# Patient Record
Sex: Female | Born: 1990 | Race: White | Hispanic: No | Marital: Single | Smoking: Never smoker
Health system: Southern US, Community
[De-identification: ages and names within clinical notes are randomized; demographics above are authoritative.]

## PROBLEM LIST (undated history)

## (undated) DIAGNOSIS — K3184 Gastroparesis: Secondary | ICD-10-CM

## (undated) DIAGNOSIS — N189 Chronic kidney disease, unspecified: Secondary | ICD-10-CM

## (undated) DIAGNOSIS — F419 Anxiety disorder, unspecified: Secondary | ICD-10-CM

## (undated) DIAGNOSIS — E079 Disorder of thyroid, unspecified: Secondary | ICD-10-CM

## (undated) DIAGNOSIS — E2740 Unspecified adrenocortical insufficiency: Secondary | ICD-10-CM

## (undated) DIAGNOSIS — E109 Type 1 diabetes mellitus without complications: Secondary | ICD-10-CM

## (undated) DIAGNOSIS — I509 Heart failure, unspecified: Secondary | ICD-10-CM

## (undated) DIAGNOSIS — G43909 Migraine, unspecified, not intractable, without status migrainosus: Secondary | ICD-10-CM

## (undated) DIAGNOSIS — E119 Type 2 diabetes mellitus without complications: Secondary | ICD-10-CM

## (undated) DIAGNOSIS — F32A Depression, unspecified: Secondary | ICD-10-CM

## (undated) DIAGNOSIS — D649 Anemia, unspecified: Secondary | ICD-10-CM

## (undated) DIAGNOSIS — I10 Essential (primary) hypertension: Secondary | ICD-10-CM

## (undated) DIAGNOSIS — I959 Hypotension, unspecified: Secondary | ICD-10-CM

## (undated) DIAGNOSIS — Z5189 Encounter for other specified aftercare: Secondary | ICD-10-CM

## (undated) HISTORY — DX: Chronic kidney disease, unspecified: N18.9

## (undated) HISTORY — DX: Anxiety disorder, unspecified: F41.9

## (undated) HISTORY — DX: Depression, unspecified: F32.A

## (undated) HISTORY — DX: Anemia, unspecified: D64.9

## (undated) HISTORY — DX: Disorder of thyroid, unspecified: E07.9

## (undated) HISTORY — PX: MOUTH SURGERY: SHX715

## (undated) HISTORY — DX: Gastroparesis: K31.84

## (undated) HISTORY — DX: Migraine, unspecified, not intractable, without status migrainosus: G43.909

## (undated) HISTORY — DX: Heart failure, unspecified: I50.9

## (undated) HISTORY — DX: Encounter for other specified aftercare: Z51.89

## (undated) HISTORY — DX: Hypotension, unspecified: I95.9

## (undated) SURGERY — ECHOCARDIOGRAM, TRANSESOPHAGEAL
Anesthesia: Moderate Sedation

---
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
## 2010-02-28 ENCOUNTER — Emergency Department: Payer: Self-pay | Admitting: Emergency Medicine

## 2010-06-01 DIAGNOSIS — J209 Acute bronchitis, unspecified: Secondary | ICD-10-CM | POA: Insufficient documentation

## 2010-07-21 ENCOUNTER — Emergency Department: Payer: Self-pay | Admitting: Emergency Medicine

## 2010-09-30 ENCOUNTER — Emergency Department: Payer: Self-pay | Admitting: Emergency Medicine

## 2011-11-22 ENCOUNTER — Emergency Department: Payer: Self-pay | Admitting: Emergency Medicine

## 2011-11-22 LAB — PREGNANCY, URINE: Pregnancy Test, Urine: NEGATIVE m[IU]/mL

## 2013-03-01 ENCOUNTER — Emergency Department: Payer: Self-pay | Admitting: Emergency Medicine

## 2013-03-01 LAB — CBC
HCT: 35.3 % (ref 35.0–47.0)
HGB: 12.2 g/dL (ref 12.0–16.0)
RDW: 13 % (ref 11.5–14.5)
WBC: 4.6 10*3/uL (ref 3.6–11.0)

## 2013-03-01 LAB — COMPREHENSIVE METABOLIC PANEL
Albumin: 3.9 g/dL (ref 3.4–5.0)
Anion Gap: 5 — ABNORMAL LOW (ref 7–16)
BUN: 13 mg/dL (ref 7–18)
Bilirubin,Total: 0.5 mg/dL (ref 0.2–1.0)
Co2: 30 mmol/L (ref 21–32)
Creatinine: 0.76 mg/dL (ref 0.60–1.30)
EGFR (African American): >60
EGFR (Non-African Amer.): >60
Glucose: 270 mg/dL — ABNORMAL HIGH (ref 65–99)
SGOT(AST): 32 U/L (ref 15–37)
Sodium: 133 mmol/L — ABNORMAL LOW (ref 136–145)
Total Protein: 7.5 g/dL (ref 6.4–8.2)

## 2013-03-01 LAB — URINALYSIS, COMPLETE
Bacteria: NONE SEEN
Bilirubin,UR: NEGATIVE
Blood: NEGATIVE
Protein: 100
Specific Gravity: 1.026 (ref 1.003–1.030)
Squamous Epithelial: NONE SEEN

## 2013-03-01 LAB — PREGNANCY, URINE: Pregnancy Test, Urine: NEGATIVE m[IU]/mL

## 2014-12-10 ENCOUNTER — Emergency Department
Admission: EM | Admit: 2014-12-10 | Discharge: 2014-12-10 | Disposition: A | Discharge status: Home / Self Care | Payer: Medicaid Other | Attending: Emergency Medicine | Admitting: Emergency Medicine

## 2014-12-10 ENCOUNTER — Emergency Department: Payer: Medicaid Other

## 2014-12-10 DIAGNOSIS — R102 Pelvic and perineal pain unspecified side: Secondary | ICD-10-CM

## 2014-12-10 DIAGNOSIS — Z3202 Encounter for pregnancy test, result negative: Secondary | ICD-10-CM | POA: Diagnosis not present

## 2014-12-10 LAB — URINALYSIS COMPLETE WITH MICROSCOPIC (ARMC ONLY)
Bacteria, UA: NONE SEEN
Bilirubin Urine: NEGATIVE
Glucose, UA: >500 mg/dL — AB
Hgb urine dipstick: NEGATIVE
Leukocytes, UA: NEGATIVE
Nitrite: NEGATIVE
Protein, ur: NEGATIVE mg/dL
Specific Gravity, Urine: 1.033 — ABNORMAL HIGH (ref 1.005–1.030)
WBC, UA: NONE SEEN WBC/hpf (ref 0–5)
pH: 5 (ref 5.0–8.0)

## 2014-12-10 LAB — POCT PREGNANCY, URINE: Preg Test, Ur: NEGATIVE

## 2014-12-10 LAB — WET PREP, GENITAL
Clue Cells Wet Prep HPF POC: NONE SEEN
Trich, Wet Prep: NONE SEEN
Yeast Wet Prep HPF POC: NONE SEEN

## 2014-12-10 LAB — CHLAMYDIA/NGC RT PCR (ARMC ONLY)
Chlamydia Tr: NOT DETECTED
N gonorrhoeae: NOT DETECTED

## 2014-12-10 MED ORDER — LIDOCAINE HCL (PF) 1 % IJ SOLN: 5.0000 mL | Freq: Once | INTRAMUSCULAR | Status: DC

## 2014-12-10 NOTE — ED Provider Notes (Signed)
CSN: [id number]     Arrival date & time 12/10/14  1230 History   First MD Initiated Contact with Patient 12/10/14 1258     Chief Complaint  Patient presents with  . Abdominal Pain     HPI Comments: 24 year old female presents today complaining of pelvic pain for the past 8 months. Pt reports she last had a period in Nov or Dec last year. She did have some spotting in February. Sexually active, has only had one partner in the past 6 months. No vaginal discharge or itching. No nausea or vomiting.    Patient is a 24 y.o. female presenting with female genitourinary complaint. The history is provided by the patient.  Female GU Problem This is a recurrent problem. Episode onset: over the past 6 months. The problem occurs constantly. The problem has been waxing and waning. Pertinent negatives include no abdominal pain, change in bowel habit, chills, fever, urinary symptoms or vomiting. Nothing aggravates the symptoms. She has tried NSAIDs for the symptoms. The treatment provided mild relief.    No past medical history on file. No past surgical history on file. No family history on file. History  Substance Use Topics  . Smoking status: Never Smoker   . Smokeless tobacco: Never Used  . Alcohol Use: No   OB History    No data available     Review of Systems  Constitutional: Negative for fever and chills.  Gastrointestinal: Negative for vomiting, abdominal pain and change in bowel habit.  Genitourinary: Positive for menstrual problem and pelvic pain. Negative for dysuria, hematuria, vaginal bleeding, vaginal discharge and vaginal pain.  All other systems reviewed and are negative.     Allergies  Review of patient's allergies indicates no known allergies.  Home Medications   Prior to Admission medications   Not on File   BP 110/78 mmHg  Pulse 97  Temp(Src) 98.8 F (37.1 C) (Oral)  Resp 18  Ht 5\' 1"  (1.549 m)  Wt 120 lb (54.432 kg)  BMI 22.69 kg/m2  SpO2 100%  LMP 05/10/2014  (Exact Date) Physical Exam  Constitutional: She is oriented to person, place, and time. Vital signs are normal. She appears well-developed and well-nourished.  Non-toxic appearance. She does not have a sickly appearance. She does not appear ill.  HENT:  Head: Normocephalic and atraumatic.  Cardiovascular: Normal rate, regular rhythm, normal heart sounds and intact distal pulses.  Exam reveals no gallop and no friction rub.   No murmur heard. Pulmonary/Chest: Effort normal and breath sounds normal. No respiratory distress. She has no wheezes. She has no rales.  Abdominal: Soft. Bowel sounds are normal. She exhibits no distension. There is no tenderness. There is no rebound and no guarding.  Genitourinary: Vagina normal and uterus normal. Pelvic exam was performed with patient supine. Cervix exhibits discharge. Right adnexum displays no mass, no tenderness and no fullness. Left adnexum displays no mass, no tenderness and no fullness.  Musculoskeletal: Normal range of motion.  Neurological: She is alert and oriented to person, place, and time.  Skin: Skin is warm and dry.  Psychiatric: She has a normal mood and affect. Her behavior is normal. Judgment and thought content normal.  Nursing note and vitals reviewed.   ED Course  Procedures (including critical care time) Labs Review Labs Reviewed  WET PREP, GENITAL - Abnormal; Notable for the following:    WBC, Wet Prep HPF POC MANY (*)    All other components within normal limits  URINALYSIS COMPLETEWITH  MICROSCOPIC ([facility] ONLY) - Abnormal; Notable for the following:    Color, Urine STRAW (*)    APPearance CLEAR (*)    Glucose, UA >500 (*)    Ketones, ur TRACE (*)    Specific Gravity, Urine 1.033 (*)    Squamous Epithelial / LPF 0-5 (*)    All other components within normal limits  CHLAMYDIA/NGC RT PCR ([facility] ONLY)  POC URINE PREG, ED  POCT PREGNANCY, URINE    Imaging Review US Transvaginal Non-ob  12/10/2014   CLINICAL  DATA:  Two-month history of pelvic pain  EXAM: TRANSABDOMINAL AND TRANSVAGINAL ULTRASOUND OF PELVIS  TECHNIQUE: Study was performed transabdominally to optimize pelvic field of view evaluation and transvaginally to optimize internal visceral architecture evaluation.  COMPARISON:  None  FINDINGS: Uterus  Measurements: 7.8 x 3.4 x 4.5 cm. No fibroids or other mass visualized. Uterus is anteverted.  Endometrium  Thickness: 5 mm.  No focal abnormality visualized.  Right ovary  Measurements: 4.2 x 2.4 x 2.2 cm. There is a dominant follicle on the right containing minimal septation measuring 2.0 x 1.6 x 1.7 cm  Left ovary  Measurements: 3.5 x 1.9 x 3.0 cm. Normal appearance/no adnexal mass.  Other findings  No free fluid.  IMPRESSION: Probable minimally hemorrhagic dominant follicle on the right. Study otherwise unremarkable.   Electronically Signed   By: Bretta Bang III M.D.   On: 12/10/2014 14:50   US Pelvis Complete  12/10/2014   CLINICAL DATA:  Two-month history of pelvic pain  EXAM: TRANSABDOMINAL AND TRANSVAGINAL ULTRASOUND OF PELVIS  TECHNIQUE: Study was performed transabdominally to optimize pelvic field of view evaluation and transvaginally to optimize internal visceral architecture evaluation.  COMPARISON:  None  FINDINGS: Uterus  Measurements: 7.8 x 3.4 x 4.5 cm. No fibroids or other mass visualized. Uterus is anteverted.  Endometrium  Thickness: 5 mm.  No focal abnormality visualized.  Right ovary  Measurements: 4.2 x 2.4 x 2.2 cm. There is a dominant follicle on the right containing minimal septation measuring 2.0 x 1.6 x 1.7 cm  Left ovary  Measurements: 3.5 x 1.9 x 3.0 cm. Normal appearance/no adnexal mass.  Other findings  No free fluid.  IMPRESSION: Probable minimally hemorrhagic dominant follicle on the right. Study otherwise unremarkable.   Electronically Signed   By: Bretta Bang III M.D.   On: 12/10/2014 14:50     EKG Interpretation None      MDM  I reviewed the labs and  ultrasound above. Discussed with patient that her findings are essential normal. Recommended follow up with OBGYN for hormone work up given amenorrhea. No indication for further workup in the ER.  Final diagnoses:  Pelvic pain in female        Mickeal Skinner 12/10/14 1526  Sharyn Creamer, MD 12/10/14 1537

## 2014-12-10 NOTE — ED Notes (Signed)
Per patient she has had irregular periods with cramping since November of last year. Patient reports that cramping has gotten worse over the past two weeks.  Patient also complains of intermittent constipation. Last normal BM 12-09-2014.

## 2016-02-16 DIAGNOSIS — E1140 Type 2 diabetes mellitus with diabetic neuropathy, unspecified: Secondary | ICD-10-CM | POA: Insufficient documentation

## 2016-03-20 LAB — HM HEPATITIS C SCREENING LAB: HM Hepatitis Screen: NEGATIVE

## 2016-04-02 DIAGNOSIS — Z8249 Family history of ischemic heart disease and other diseases of the circulatory system: Secondary | ICD-10-CM

## 2016-04-02 DIAGNOSIS — Z79899 Other long term (current) drug therapy: Secondary | ICD-10-CM

## 2016-04-02 DIAGNOSIS — E878 Other disorders of electrolyte and fluid balance, not elsewhere classified: Secondary | ICD-10-CM | POA: Diagnosis present

## 2016-04-02 DIAGNOSIS — Z833 Family history of diabetes mellitus: Secondary | ICD-10-CM

## 2016-04-02 DIAGNOSIS — Z794 Long term (current) use of insulin: Secondary | ICD-10-CM

## 2016-04-02 DIAGNOSIS — E860 Dehydration: Secondary | ICD-10-CM | POA: Diagnosis present

## 2016-04-02 DIAGNOSIS — N390 Urinary tract infection, site not specified: Secondary | ICD-10-CM | POA: Diagnosis present

## 2016-04-02 DIAGNOSIS — I10 Essential (primary) hypertension: Secondary | ICD-10-CM | POA: Diagnosis present

## 2016-04-02 DIAGNOSIS — E1010 Type 1 diabetes mellitus with ketoacidosis without coma: Principal | ICD-10-CM | POA: Diagnosis present

## 2016-04-02 DIAGNOSIS — G8918 Other acute postprocedural pain: Secondary | ICD-10-CM | POA: Diagnosis present

## 2016-04-02 DIAGNOSIS — R809 Proteinuria, unspecified: Secondary | ICD-10-CM | POA: Diagnosis present

## 2016-04-02 NOTE — ED Notes (Signed)
Pt s/p oral surgery (wisdom teeth removal) last Tuesday, c/o pain not getting better. Pt is diabetic, last CBG 287.

## 2016-04-03 ENCOUNTER — Encounter: Payer: Self-pay | Admitting: Emergency Medicine

## 2016-04-03 ENCOUNTER — Inpatient Hospital Stay
Admission: EM | Admit: 2016-04-03 | Discharge: 2016-04-04 | DRG: 638 | Disposition: A | Discharge status: Home / Self Care | Payer: Medicaid Other | Attending: Internal Medicine | Admitting: Internal Medicine

## 2016-04-03 DIAGNOSIS — E878 Other disorders of electrolyte and fluid balance, not elsewhere classified: Secondary | ICD-10-CM | POA: Diagnosis present

## 2016-04-03 DIAGNOSIS — N390 Urinary tract infection, site not specified: Secondary | ICD-10-CM

## 2016-04-03 DIAGNOSIS — E1110 Type 2 diabetes mellitus with ketoacidosis without coma: Secondary | ICD-10-CM | POA: Diagnosis present

## 2016-04-03 DIAGNOSIS — Z8249 Family history of ischemic heart disease and other diseases of the circulatory system: Secondary | ICD-10-CM | POA: Diagnosis not present

## 2016-04-03 DIAGNOSIS — Z833 Family history of diabetes mellitus: Secondary | ICD-10-CM | POA: Diagnosis not present

## 2016-04-03 DIAGNOSIS — I10 Essential (primary) hypertension: Secondary | ICD-10-CM | POA: Diagnosis present

## 2016-04-03 DIAGNOSIS — Z79899 Other long term (current) drug therapy: Secondary | ICD-10-CM | POA: Diagnosis not present

## 2016-04-03 DIAGNOSIS — G8918 Other acute postprocedural pain: Secondary | ICD-10-CM

## 2016-04-03 DIAGNOSIS — E860 Dehydration: Secondary | ICD-10-CM | POA: Diagnosis present

## 2016-04-03 DIAGNOSIS — E1010 Type 1 diabetes mellitus with ketoacidosis without coma: Secondary | ICD-10-CM

## 2016-04-03 DIAGNOSIS — R809 Proteinuria, unspecified: Secondary | ICD-10-CM | POA: Diagnosis present

## 2016-04-03 DIAGNOSIS — Z794 Long term (current) use of insulin: Secondary | ICD-10-CM | POA: Diagnosis not present

## 2016-04-03 DIAGNOSIS — E0810 Diabetes mellitus due to underlying condition with ketoacidosis without coma: Secondary | ICD-10-CM

## 2016-04-03 HISTORY — DX: Type 2 diabetes mellitus without complications: E11.9

## 2016-04-03 HISTORY — DX: Type 1 diabetes mellitus without complications: E10.9

## 2016-04-03 HISTORY — DX: Essential (primary) hypertension: I10

## 2016-04-03 LAB — BASIC METABOLIC PANEL
ANION GAP: 9 (ref 5–15)
Anion gap: 17 — ABNORMAL HIGH (ref 5–15)
Anion gap: 13 (ref 5–15)
Anion gap: 12 (ref 5–15)
Anion gap: 7 (ref 5–15)
BUN: 16 mg/dL (ref 6–20)
BUN: 15 mg/dL (ref 6–20)
BUN: 13 mg/dL (ref 6–20)
BUN: 10 mg/dL (ref 6–20)
BUN: 10 mg/dL (ref 6–20)
CHLORIDE: 113 mmol/L — AB (ref 101–111)
CHLORIDE: 107 mmol/L (ref 101–111)
CO2: 8 mmol/L — ABNORMAL LOW (ref 22–32)
CO2: 8 mmol/L — ABNORMAL LOW (ref 22–32)
CO2: 12 mmol/L — ABNORMAL LOW (ref 22–32)
CO2: 14 mmol/L — AB (ref 22–32)
CO2: 23 mmol/L (ref 22–32)
CREATININE: 0.56 mg/dL (ref 0.44–1.00)
Calcium: 8.7 mg/dL — ABNORMAL LOW (ref 8.9–10.3)
Calcium: 8.2 mg/dL — ABNORMAL LOW (ref 8.9–10.3)
Calcium: 8.3 mg/dL — ABNORMAL LOW (ref 8.9–10.3)
Calcium: 8.4 mg/dL — ABNORMAL LOW (ref 8.9–10.3)
Calcium: 8.3 mg/dL — ABNORMAL LOW (ref 8.9–10.3)
Chloride: 113 mmol/L — ABNORMAL HIGH (ref 101–111)
Chloride: 116 mmol/L — ABNORMAL HIGH (ref 101–111)
Chloride: 112 mmol/L — ABNORMAL HIGH (ref 101–111)
Creatinine, Ser: 1.01 mg/dL — ABNORMAL HIGH (ref 0.44–1.00)
Creatinine, Ser: 0.7 mg/dL (ref 0.44–1.00)
Creatinine, Ser: 0.76 mg/dL (ref 0.44–1.00)
Creatinine, Ser: 0.65 mg/dL (ref 0.44–1.00)
GFR calc Af Amer: >60 mL/min (ref >60)
GFR calc Af Amer: >60 mL/min (ref >60)
GFR calc Af Amer: >60 mL/min (ref >60)
GFR calc Af Amer: >60 mL/min (ref >60)
GFR calc Af Amer: >60 mL/min (ref >60)
GFR calc non Af Amer: >60 mL/min (ref >60)
GFR calc non Af Amer: >60 mL/min (ref >60)
GFR calc non Af Amer: >60 mL/min (ref >60)
GFR calc non Af Amer: >60 mL/min (ref >60)
GLUCOSE: 277 mg/dL — AB (ref 65–99)
Glucose, Bld: 246 mg/dL — ABNORMAL HIGH (ref 65–99)
Glucose, Bld: 124 mg/dL — ABNORMAL HIGH (ref 65–99)
Glucose, Bld: 251 mg/dL — ABNORMAL HIGH (ref 65–99)
Glucose, Bld: 178 mg/dL — ABNORMAL HIGH (ref 65–99)
POTASSIUM: 4.1 mmol/L (ref 3.5–5.1)
Potassium: 3.8 mmol/L (ref 3.5–5.1)
Potassium: 4.2 mmol/L (ref 3.5–5.1)
Potassium: 4.2 mmol/L (ref 3.5–5.1)
Potassium: 3.2 mmol/L — ABNORMAL LOW (ref 3.5–5.1)
Sodium: 138 mmol/L (ref 135–145)
Sodium: 137 mmol/L (ref 135–145)
Sodium: 136 mmol/L (ref 135–145)
Sodium: 136 mmol/L (ref 135–145)
Sodium: 137 mmol/L (ref 135–145)

## 2016-04-03 LAB — GLUCOSE, CAPILLARY
Glucose-Capillary: 101 mg/dL — ABNORMAL HIGH (ref 65–99)
Glucose-Capillary: 106 mg/dL — ABNORMAL HIGH (ref 65–99)
Glucose-Capillary: 108 mg/dL — ABNORMAL HIGH (ref 65–99)
Glucose-Capillary: 131 mg/dL — ABNORMAL HIGH (ref 65–99)
Glucose-Capillary: 137 mg/dL — ABNORMAL HIGH (ref 65–99)
Glucose-Capillary: 158 mg/dL — ABNORMAL HIGH (ref 65–99)
Glucose-Capillary: 174 mg/dL — ABNORMAL HIGH (ref 65–99)
Glucose-Capillary: 186 mg/dL — ABNORMAL HIGH (ref 65–99)
Glucose-Capillary: 231 mg/dL — ABNORMAL HIGH (ref 65–99)
Glucose-Capillary: 288 mg/dL — ABNORMAL HIGH (ref 65–99)
Glucose-Capillary: 305 mg/dL — ABNORMAL HIGH (ref 65–99)
Glucose-Capillary: 315 mg/dL — ABNORMAL HIGH (ref 65–99)
Glucose-Capillary: 324 mg/dL — ABNORMAL HIGH (ref 65–99)

## 2016-04-03 LAB — CBC WITH DIFFERENTIAL/PLATELET
Basophils Absolute: 0.1 10*3/uL (ref 0–0.1)
Basophils Relative: 1 %
Eosinophils Absolute: 0.1 10*3/uL (ref 0–0.7)
Eosinophils Relative: 1 %
HEMATOCRIT: 37.1 % (ref 35.0–47.0)
HEMOGLOBIN: 12.3 g/dL (ref 12.0–16.0)
Lymphocytes Relative: 15 %
Lymphs Abs: 2.1 10*3/uL (ref 1.0–3.6)
MCH: 29.9 pg (ref 26.0–34.0)
MCHC: 33.2 g/dL (ref 32.0–36.0)
MCV: 90.2 fL (ref 80.0–100.0)
MONOS PCT: 4 %
Monocytes Absolute: 0.5 10*3/uL (ref 0.2–0.9)
NEUTROS ABS: 10.9 10*3/uL — AB (ref 1.4–6.5)
NEUTROS PCT: 79 %
Platelets: 333 10*3/uL (ref 150–440)
RBC: 4.11 MIL/uL (ref 3.80–5.20)
RDW: 12.1 % (ref 11.5–14.5)
WBC: 13.7 10*3/uL — ABNORMAL HIGH (ref 3.6–11.0)

## 2016-04-03 LAB — COMPREHENSIVE METABOLIC PANEL
ALK PHOS: 68 U/L (ref 38–126)
ALT: 10 U/L — ABNORMAL LOW (ref 14–54)
ANION GAP: 22 — AB (ref 5–15)
AST: 11 U/L — ABNORMAL LOW (ref 15–41)
Albumin: 4.4 g/dL (ref 3.5–5.0)
BILIRUBIN TOTAL: 1.7 mg/dL — AB (ref 0.3–1.2)
BUN: 17 mg/dL (ref 6–20)
CO2: 8 mmol/L — ABNORMAL LOW (ref 22–32)
CREATININE: 0.88 mg/dL (ref 0.44–1.00)
Calcium: 9.1 mg/dL (ref 8.9–10.3)
Chloride: 106 mmol/L (ref 101–111)
Glucose, Bld: 349 mg/dL — ABNORMAL HIGH (ref 65–99)
Potassium: 4.8 mmol/L (ref 3.5–5.1)
Sodium: 136 mmol/L (ref 135–145)
Total Protein: 8.2 g/dL — ABNORMAL HIGH (ref 6.5–8.1)

## 2016-04-03 LAB — BETA-HYDROXYBUTYRIC ACID: Beta-Hydroxybutyric Acid: >8 mmol/L — ABNORMAL HIGH (ref 0.05–0.27)

## 2016-04-03 LAB — URINALYSIS COMPLETE WITH MICROSCOPIC (ARMC ONLY)
Bilirubin Urine: NEGATIVE
HGB URINE DIPSTICK: NEGATIVE
Nitrite: NEGATIVE
PH: 5 (ref 5.0–8.0)
Protein, ur: 100 mg/dL — AB
SPECIFIC GRAVITY, URINE: 1.021 (ref 1.005–1.030)

## 2016-04-03 LAB — MRSA PCR SCREENING: MRSA by PCR: NEGATIVE

## 2016-04-03 LAB — POCT PREGNANCY, URINE: Preg Test, Ur: NEGATIVE

## 2016-04-03 MED ORDER — ONDANSETRON HCL 4 MG/2ML IJ SOLN
4.0000 mg | INTRAMUSCULAR | Status: AC
  Administered 2016-04-03: 4 mg via INTRAVENOUS
  Filled 2016-04-03: qty 2

## 2016-04-03 MED ORDER — SODIUM CHLORIDE 0.9 % IV SOLN: INTRAVENOUS | Status: DC

## 2016-04-03 MED ORDER — DEXTROSE 5 % IV SOLN
INTRAVENOUS | Status: DC
  Administered 2016-04-03: 14:00:00 via INTRAVENOUS
  Filled 2016-04-03 (×2): qty 150

## 2016-04-03 MED ORDER — SODIUM CHLORIDE 0.9 % IV SOLN
INTRAVENOUS | Status: AC
  Administered 2016-04-03: 07:00:00 via INTRAVENOUS

## 2016-04-03 MED ORDER — POTASSIUM CHLORIDE CRYS ER 20 MEQ PO TBCR
40.0000 meq | EXTENDED_RELEASE_TABLET | Freq: Once | ORAL | Status: AC
  Administered 2016-04-03: 40 meq via ORAL
  Filled 2016-04-03: qty 2

## 2016-04-03 MED ORDER — BISACODYL 5 MG PO TBEC
5.0000 mg | DELAYED_RELEASE_TABLET | Freq: Every day | ORAL | Status: DC | PRN
  Administered 2016-04-03: 5 mg via ORAL
  Filled 2016-04-03: qty 1

## 2016-04-03 MED ORDER — CEFTRIAXONE SODIUM 1 G IJ SOLR
1.0000 g | Freq: Once | INTRAMUSCULAR | Status: AC
  Administered 2016-04-03: 1 g via INTRAMUSCULAR
  Filled 2016-04-03: qty 10

## 2016-04-03 MED ORDER — SODIUM CHLORIDE 0.9 % IV SOLN
30.0000 meq | Freq: Once | INTRAVENOUS | Status: AC
  Administered 2016-04-03: 30 meq via INTRAVENOUS
  Filled 2016-04-03 (×2): qty 15

## 2016-04-03 MED ORDER — MORPHINE SULFATE (PF) 4 MG/ML IV SOLN
4.0000 mg | Freq: Once | INTRAVENOUS | Status: AC
  Administered 2016-04-03: 4 mg via INTRAVENOUS
  Filled 2016-04-03: qty 1

## 2016-04-03 MED ORDER — INSULIN ASPART 100 UNIT/ML ~~LOC~~ SOLN
0.0000 [IU] | Freq: Three times a day (TID) | SUBCUTANEOUS | Status: DC
  Administered 2016-04-03: 5 [IU] via SUBCUTANEOUS
  Administered 2016-04-04: 2 [IU] via SUBCUTANEOUS
  Filled 2016-04-03: qty 2

## 2016-04-03 MED ORDER — INSULIN ASPART 100 UNIT/ML ~~LOC~~ SOLN
0.0000 [IU] | Freq: Every day | SUBCUTANEOUS | Status: DC
  Filled 2016-04-03: qty 5

## 2016-04-03 MED ORDER — DEXTROSE 5 % IV SOLN: 1.0000 g | INTRAVENOUS | Status: DC

## 2016-04-03 MED ORDER — DEXTROSE-NACL 5-0.45 % IV SOLN
INTRAVENOUS | Status: DC
  Administered 2016-04-03: 07:00:00 via INTRAVENOUS

## 2016-04-03 MED ORDER — POLYETHYLENE GLYCOL 3350 17 G PO PACK
17.0000 g | PACK | Freq: Every day | ORAL | Status: DC
  Administered 2016-04-03 – 2016-04-04 (×2): 17 g via ORAL
  Filled 2016-04-03 (×2): qty 1

## 2016-04-03 MED ORDER — CEPHALEXIN 500 MG PO CAPS
500.0000 mg | ORAL_CAPSULE | Freq: Two times a day (BID) | ORAL | Status: DC
  Administered 2016-04-03 – 2016-04-04 (×3): 500 mg via ORAL
  Filled 2016-04-03 (×3): qty 1

## 2016-04-03 MED ORDER — ENOXAPARIN SODIUM 40 MG/0.4ML ~~LOC~~ SOLN
40.0000 mg | SUBCUTANEOUS | Status: DC
  Administered 2016-04-03 – 2016-04-04 (×2): 40 mg via SUBCUTANEOUS
  Filled 2016-04-03 (×2): qty 0.4

## 2016-04-03 MED ORDER — SODIUM CHLORIDE 0.9 % IV BOLUS (SEPSIS)
1000.0000 mL | INTRAVENOUS | Status: AC
  Administered 2016-04-03: 1000 mL via INTRAVENOUS

## 2016-04-03 MED ORDER — SODIUM CHLORIDE 0.9 % IV SOLN
INTRAVENOUS | Status: DC
  Administered 2016-04-03: 3.4 [IU]/h via INTRAVENOUS

## 2016-04-03 MED ORDER — IBUPROFEN 400 MG PO TABS
400.0000 mg | ORAL_TABLET | Freq: Four times a day (QID) | ORAL | Status: DC | PRN
  Administered 2016-04-04 (×2): 400 mg via ORAL
  Filled 2016-04-03 (×4): qty 1

## 2016-04-03 MED ORDER — INSULIN GLARGINE 100 UNIT/ML ~~LOC~~ SOLN
11.0000 [IU] | Freq: Every day | SUBCUTANEOUS | Status: DC
  Administered 2016-04-03 – 2016-04-04 (×2): 11 [IU] via SUBCUTANEOUS
  Filled 2016-04-03 (×2): qty 0.11

## 2016-04-03 MED ORDER — KETOROLAC TROMETHAMINE 30 MG/ML IJ SOLN
15.0000 mg | Freq: Once | INTRAMUSCULAR | Status: AC
Start: 2016-04-03 — End: 2016-04-03
  Administered 2016-04-03: 15 mg via INTRAVENOUS
  Filled 2016-04-03: qty 1

## 2016-04-03 MED ORDER — SENNA 8.6 MG PO TABS: 1.0000 | ORAL_TABLET | Freq: Two times a day (BID) | ORAL | Status: DC | PRN

## 2016-04-03 MED ORDER — INSULIN REGULAR HUMAN 100 UNIT/ML IJ SOLN
INTRAMUSCULAR | Status: DC
  Administered 2016-04-03: 2.5 [IU]/h via INTRAVENOUS
  Filled 2016-04-03: qty 2.5

## 2016-04-03 MED ORDER — INSULIN ASPART 100 UNIT/ML ~~LOC~~ SOLN: 0.0000 [IU] | Freq: Three times a day (TID) | SUBCUTANEOUS | Status: DC

## 2016-04-03 NOTE — ED Triage Notes (Signed)
Pt s/p oral surgery (wisdom teeth and 2nd set molars removal) last Tuesday, c/o pain not getting better. Pt is diabetic, last CBG 287.   Pt also c/o dizziness and states pain all over, reports new bruising at right front jaw, obvious bilateral swelling to face, pt was prescribed narcotics post surg but was unable to fill it, taking tylenol and gabapentin 300mg  prescribed for diabetic foot pain.

## 2016-04-03 NOTE — ED Provider Notes (Addendum)
[facility] Emergency Department Provider Note  ____________________________________________   First MD Initiated Contact with Patient 04/03/16 [phone]     (approximate)  I have reviewed the triage vital signs and the nursing notes.   HISTORY  Chief Complaint Post-op Problem    HPI Ashley BlazerMirisha P Cleon Gustinshby is a 25 y.o. 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  She reports that she was not able to go to the pharmacy to have any pain medication or the oral rinse prescription filled.  She has been taking Tylenol but her pain has been persistent, moderate to severe, and keeping her from eating or drinking very much.  She has swelling on both sides of her face and reports that it hurts worse whenever she tries to eat or drink but she is not having any trouble swallowing her secretions, speaking, and no issues with her airway.  She reports that over the last couple of days her symptoms have gotten much worse and today she has felt her heart pounding and her lips getting dry.  She continues to take her insulin but she has not been able to eat or drink much.  She has had nausea and some mild abdominal cramping but no severe pain.  She is not having any difficulty breathing but she is breathing faster than usual.She denies fever/chills, chest pain, shortness of breath.  SHe reports that she has been in DKA "many times in the past".   Past Medical History:  Diagnosis Date  . Diabetes mellitus without complication (HCC)   . Hypertension   . Type 1 diabetes [facility](HCC)     Patient Active Problem List   Diagnosis Date Noted  . DKA (diabetic ketoacidoses) (HCC) 04/03/2016    Past Surgical History:  Procedure Laterality Date  . CESAREAN SECTION    . MOUTH SURGERY      Prior to Admission medications   Medication Sig Start Date End Date Taking?  Authorizing Provider  acetaminophen (TYLENOL) 500 MG tablet Take 500 mg by mouth every 4 (four) hours as needed.   Yes Historical Provider, MD  gabapentin (NEURONTIN) 300 MG capsule Take 300 mg by mouth 3 (three) times daily.   Yes Historical Provider, MD  insulin aspart (NOVOLOG) 100 UNIT/ML injection Inject 1-15 Units into the skin 3 (three) times daily before meals. Per sliding scale/ 2 units  Per 15 grams of carbs. Extra 1-15 units per glucose   Yes Historical Provider, MD  insulin glargine (LANTUS) 100 UNIT/ML injection Inject 11 Units into the skin daily.   Yes Historical Provider, MD  lisinopril (PRINIVIL,ZESTRIL) 5 MG tablet Take 5 mg by mouth daily.   Yes Historical Provider, MD  medroxyPROGESTERone (DEPO-PROVERA) 150 MG/ML injection Inject 150 mg into the muscle every 3 (three) months.   Yes Historical Provider, MD    Allergies Patient has no known allergies.  History reviewed. No pertinent family history.  Social History Social History  Substance Use Topics  . Smoking status: Never Smoker  . Smokeless tobacco: Never Used  . Alcohol use No    Review of Systems Constitutional: No fever/chills Eyes: No visual changes. ENT: No sore throat. Pain in both sides of the back of her mouth at the surgical sites. Cardiovascular: Denies chest pain. Respiratory: Denies shortness of breath. Gastrointestinal: Occasional abdominal cramping, nausea, no vomiting.  No diarrhea.  No constipation. Genitourinary: Negative  for dysuria. Musculoskeletal: Negative for back pain. Skin: Negative for rash. Neurological: Negative for headaches, focal weakness or numbness.  Lightheadedness and general malaise, generalized weakness  10-point ROS otherwise negative.  ____________________________________________   PHYSICAL EXAM:  VITAL SIGNS: ED Triage Vitals  Enc Vitals Group     BP 04/02/16 2355 101/72     Pulse Rate 04/02/16 2355 (!) 133     Resp 04/02/16 2355 18     Temp 04/02/16 2355 97.8  F (36.6 C)     Temp Source 04/02/16 2355 Oral     SpO2 04/02/16 2355 99 %     Weight 04/02/16 2357 108 lb (49 kg)     Height 04/02/16 2357 5\' 1"  (1.549 m)     Head Circumference --      Peak Flow --      Pain Score 04/03/16 0020 8     Pain Loc --      Pain Edu? --      Excl. in GC? --     Constitutional: Alert and oriented. Ill-appearing but non-toxic . HR 130s.  Appears quite dry. Eyes: Conjunctivae are normal. PERRL. EOMI. Head: Atraumatic. Nose: No congestion/rhinnorhea. Mouth/Throat: Mucous membranes are dry, lips are cracked.  Oropharynx non-erythematous.  No obvious dry socket or signs of acute infection at surgical sites.  Pain in jaw with trying to open wide, but not specifically trismus Neck: No stridor.  No meningeal signs.   Cardiovascular: Tachycardia, regular rhythm. Good peripheral circulation. Grossly normal heart sounds. Respiratory: Normal respiratory effort.  No retractions. Lungs CTAB. Gastrointestinal: Soft and nontender. No distention.  Musculoskeletal: No lower extremity tenderness nor edema. No gross deformities of extremities. Neurologic:  Normal speech and language. No gross focal neurologic deficits are appreciated.  Skin:  Skin is warm, dry and intact. No rash noted. Psychiatric: Mood and affect are normal. Speech and behavior are normal.  ____________________________________________   LABS (all labs ordered are listed, but only abnormal results are displayed)  Labs Reviewed  GLUCOSE, CAPILLARY - Abnormal; Notable for the following:       Result Value   Glucose-Capillary 315 (*)    All other components within normal limits  CBC WITH DIFFERENTIAL/PLATELET - Abnormal; Notable for the following:    WBC 13.7 (*)    Neutro Abs 10.9 (*)    All other components within normal limits  COMPREHENSIVE METABOLIC PANEL - Abnormal; Notable for the following:    CO2 8 (*)    Glucose, Bld 349 (*)    Total Protein 8.2 (*)    AST 11 (*)    ALT 10 (*)    Total  Bilirubin 1.7 (*)    Anion gap 22 (*)    All other components within normal limits  BETA-HYDROXYBUTYRIC ACID - Abnormal; Notable for the following:    Beta-Hydroxybutyric Acid >8.00 (*)    All other components within normal limits  URINALYSIS COMPLETEWITH MICROSCOPIC ([facility] ONLY) - Abnormal; Notable for the following:    Color, Urine YELLOW (*)    APPearance CLEAR (*)    Glucose, UA >500 (*)    Ketones, ur 2+ (*)    Protein, ur 100 (*)    Leukocytes, UA 1+ (*)    Bacteria, UA RARE (*)    Squamous Epithelial / LPF 0-5 (*)    All other components within normal limits  GLUCOSE, CAPILLARY - Abnormal; Notable for the following:    Glucose-Capillary 305 (*)    All other components within normal limits  URINE CULTURE  MRSA PCR SCREENING  POC URINE PREG, ED  POCT PREGNANCY, URINE   ____________________________________________  EKG  EKG not ordered by ED physician ____________________________________________  RADIOLOGY   No results found.  ____________________________________________   PROCEDURES  Procedure(s) performed:   .Critical Care Performed by: [name] Authorized by: [name]   Critical care provider statement:    Critical care time (minutes):  30   Critical care time was exclusive of:  Separately billable procedures and treating other patients   Critical care was necessary to treat or prevent imminent or life-threatening deterioration of the following conditions:  Metabolic crisis   Critical care was time spent personally by me on the following activities:  Development of treatment plan with patient or surrogate, discussions with consultants, evaluation of patient's response to treatment, examination of patient, obtaining history from patient or surrogate, ordering and performing treatments and interventions, ordering and review of laboratory studies, ordering and review of radiographic studies, pulse oximetry, re-evaluation of patient's condition and  review of old charts       Critical Care performed: No ____________________________________________   INITIAL IMPRESSION / ASSESSMENT AND PLAN / ED COURSE  Pertinent labs & imaging results that were available during my care of the patient were reviewed by me and considered in my medical decision making (see chart for details).  Ill-appearing, I suspect dehydration and possible DKA in the setting of the acute pain and decreased by mouth intake after oral surgery.  No signs of acute oral infection.  We are checking labs and providing a normal saline bolus while we are awaiting for the results.  I provided her morphine and Zofran and she states that her pain is better but she still feels ill all over.   Clinical Course as of Apr 03 458  Sat Apr 03, 2016  1610 Results consistent with DKA.  Will admit to hospitalist for further management.  [CF]  0429 +UTI, giving ceftriaxone 1 g IM (poor IV access).  Urine culture ordered.  [CF]    Clinical Course User Index [CF] [name], MD    ____________________________________________  FINAL CLINICAL IMPRESSION(S) / ED DIAGNOSES  Final diagnoses:  Diabetic ketoacidosis without coma associated with diabetes mellitus due to underlying condition (HCC)  Post-operative pain  Urinary tract infection without hematuria, site unspecified     MEDICATIONS GIVEN DURING THIS VISIT:  Medications  insulin regular (NOVOLIN R,HUMULIN R) 250 Units in sodium chloride 0.9 % 250 mL (1 Units/mL) infusion (2.5 Units/hr Intravenous New Bag/Given 04/03/16 0416)  cefTRIAXone (ROCEPHIN) injection 1 g (not administered)  sodium chloride 0.9 % bolus 1,000 mL (0 mLs Intravenous Stopped 04/03/16 0349)  morphine 4 MG/ML injection 4 mg (4 mg Intravenous Given 04/03/16 0143)  ondansetron (ZOFRAN) injection 4 mg (4 mg Intravenous Given 04/03/16 0143)  ketorolac (TORADOL) 30 MG/ML injection 15 mg (15 mg Intravenous Given 04/03/16 0143)     NEW OUTPATIENT  MEDICATIONS STARTED DURING THIS VISIT:  New Prescriptions   No medications on file    Modified Medications   No medications on file    Discontinued Medications   No medications on file     Note:  This document was prepared using Dragon voice recognition software and may include unintentional dictation errors.    [name], MD 04/03/16 9604    [name], MD 04/03/16 [phone]

## 2016-04-03 NOTE — ED Notes (Signed)
Report to rebecca, rn.  

## 2016-04-03 NOTE — H&P (Signed)
Ashley Camacho is an 25 y.o. female.   Chief Complaint: Nausea HPI: The patient with past medical history of type 1 diabetes presents emergency department complaining of nausea. The patient recently underwent oral surgery and has not been able to eat or drink since that time. She noticed she was feeling slightly nauseous and short of breath and came to the emergency department for evaluation. She was found to have elevated blood sugar as well as positive anion gap consistent with diabetic ketoacidosis. The patient's heart rate was also persistently elevated which prompted the emergency department staff to initiate an insulin drip to call the hospitalist service for admission.  Past Medical History:  Diagnosis Date  . Diabetes mellitus without complication ([place])   . Hypertension   . Type 1 diabetes [facility])     Past Surgical History:  Procedure Laterality Date  . CESAREAN SECTION    . MOUTH SURGERY      Family History  Problem Relation Age of Onset  . Diabetes type I Father   . CAD Father   . CAD Paternal Grandmother   . CAD Paternal Grandfather    Social History:  reports that she has never smoked. She has never used smokeless tobacco. She reports that she does not drink alcohol. Her drug history is not on file.  Allergies: No Known Allergies  Medications Prior to Admission  Medication Sig Dispense Refill  . acetaminophen (TYLENOL) 500 MG tablet Take 500 mg by mouth every 4 (four) hours as needed.    . gabapentin (NEURONTIN) 300 MG capsule Take 300 mg by mouth 3 (three) times daily.    . insulin aspart (NOVOLOG) 100 UNIT/ML injection Inject 1-15 Units into the skin 3 (three) times daily before meals. Per sliding scale/ 2 units  Per 15 grams of carbs. Extra 1-15 units per glucose    . insulin glargine (LANTUS) 100 UNIT/ML injection Inject 11 Units into the skin daily.    Marland Kitchen lisinopril (PRINIVIL,ZESTRIL) 5 MG tablet Take 5 mg by mouth daily.    . medroxyPROGESTERone (DEPO-PROVERA) 150  MG/ML injection Inject 150 mg into the muscle every 3 (three) months.      Results for orders placed or performed during the hospital encounter of 04/03/16 (from the past 48 hour(s))  Glucose, capillary     Status: Abnormal   Collection Time: 04/03/16 12:19 AM  Result Value Ref Range   Glucose-Capillary 315 (H) 65 - 99 mg/dL  CBC with Differential/Platelet     Status: Abnormal   Collection Time: 04/03/16  2:01 AM  Result Value Ref Range   WBC 13.7 (H) 3.6 - 11.0 K/uL   RBC 4.11 3.80 - 5.20 MIL/uL   Hemoglobin 12.3 12.0 - 16.0 g/dL   HCT 37.1 35.0 - 47.0 %   MCV 90.2 80.0 - 100.0 fL   MCH 29.9 26.0 - 34.0 pg   MCHC 33.2 32.0 - 36.0 g/dL   RDW 12.1 11.5 - 14.5 %   Platelets 333 150 - 440 K/uL   Neutrophils Relative % 79 %   Neutro Abs 10.9 (H) 1.4 - 6.5 K/uL   Lymphocytes Relative 15 %   Lymphs Abs 2.1 1.0 - 3.6 K/uL   Monocytes Relative 4 %   Monocytes Absolute 0.5 0.2 - 0.9 K/uL   Eosinophils Relative 1 %   Eosinophils Absolute 0.1 0 - 0.7 K/uL   Basophils Relative 1 %   Basophils Absolute 0.1 0 - 0.1 K/uL  Comprehensive metabolic panel  Status: Abnormal   Collection Time: 04/03/16  2:01 AM  Result Value Ref Range   Sodium 136 135 - 145 mmol/L    Comment: ELECTROLYTES REPEATED.PMH   Potassium 4.8 3.5 - 5.1 mmol/L   Chloride 106 101 - 111 mmol/L   CO2 8 (L) 22 - 32 mmol/L   Glucose, Bld 349 (H) 65 - 99 mg/dL   BUN 17 6 - 20 mg/dL   Creatinine, Ser 0.88 0.44 - 1.00 mg/dL   Calcium 9.1 8.9 - 10.3 mg/dL   Total Protein 8.2 (H) 6.5 - 8.1 g/dL   Albumin 4.4 3.5 - 5.0 g/dL   AST 11 (L) 15 - 41 U/L   ALT 10 (L) 14 - 54 U/L   Alkaline Phosphatase 68 38 - 126 U/L   Total Bilirubin 1.7 (H) 0.3 - 1.2 mg/dL   GFR calc non Af Amer >60 >60 mL/min   GFR calc Af Amer >60 >60 mL/min    Comment: (NOTE) The eGFR has been calculated using the CKD EPI equation. This calculation has not been validated in all clinical situations. eGFR's persistently <60 mL/min signify possible Chronic  Kidney Disease.    Anion gap 22 (H) 5 - 15  Beta-hydroxybutyric acid     Status: Abnormal   Collection Time: 04/03/16  2:01 AM  Result Value Ref Range   Beta-Hydroxybutyric Acid >8.00 (H) 0.05 - 0.27 mmol/L    Comment: RESULT CONFIRMED BY MANUAL DILUTION [facility]  Urinalysis complete, with microscopic ([facility] only)     Status: Abnormal   Collection Time: 04/03/16  3:48 AM  Result Value Ref Range   Color, Urine YELLOW (A) YELLOW   APPearance CLEAR (A) CLEAR   Glucose, UA >500 (A) NEGATIVE mg/dL   Bilirubin Urine NEGATIVE NEGATIVE   Ketones, ur 2+ (A) NEGATIVE mg/dL   Specific Gravity, Urine 1.021 1.005 - 1.030   Hgb urine dipstick NEGATIVE NEGATIVE   pH 5.0 5.0 - 8.0   Protein, ur 100 (A) NEGATIVE mg/dL   Nitrite NEGATIVE NEGATIVE   Leukocytes, UA 1+ (A) NEGATIVE   RBC / HPF 0-5 0 - 5 RBC/hpf   WBC, UA TOO NUMEROUS TO COUNT 0 - 5 WBC/hpf   Bacteria, UA RARE (A) NONE SEEN   Squamous Epithelial / LPF 0-5 (A) NONE SEEN   Mucous PRESENT    Hyaline Casts, UA PRESENT   Pregnancy, urine POC     Status: None   Collection Time: 04/03/16  3:54 AM  Result Value Ref Range   Preg Test, Ur NEGATIVE NEGATIVE    Comment:        THE SENSITIVITY OF THIS METHODOLOGY IS >24 mIU/mL   Glucose, capillary     Status: Abnormal   Collection Time: 04/03/16  4:01 AM  Result Value Ref Range   Glucose-Capillary 305 (H) 65 - 99 mg/dL   Comment 1 Notify RN   Glucose, capillary     Status: Abnormal   Collection Time: 04/03/16  5:25 AM  Result Value Ref Range   Glucose-Capillary 324 (H) 65 - 99 mg/dL  MRSA PCR Screening     Status: None   Collection Time: 04/03/16  5:54 AM  Result Value Ref Range   MRSA by PCR NEGATIVE NEGATIVE    Comment:        The GeneXpert MRSA Assay (FDA approved for NASAL specimens only), is one component of a comprehensive MRSA colonization surveillance program. It is not intended to diagnose MRSA infection nor to guide or monitor treatment  for MRSA infections.   Basic  metabolic panel     Status: Abnormal   Collection Time: 04/03/16  6:17 AM  Result Value Ref Range   Sodium 138 135 - 145 mmol/L   Potassium 3.8 3.5 - 5.1 mmol/L   Chloride 113 (H) 101 - 111 mmol/L   CO2 8 (L) 22 - 32 mmol/L   Glucose, Bld 246 (H) 65 - 99 mg/dL   BUN 16 6 - 20 mg/dL   Creatinine, Ser 1.01 (H) 0.44 - 1.00 mg/dL   Calcium 8.7 (L) 8.9 - 10.3 mg/dL   GFR calc non Af Amer >60 >60 mL/min   GFR calc Af Amer >60 >60 mL/min    Comment: (NOTE) The eGFR has been calculated using the CKD EPI equation. This calculation has not been validated in all clinical situations. eGFR's persistently <60 mL/min signify possible Chronic Kidney Disease.    Anion gap 17 (H) 5 - 15  Glucose, capillary     Status: Abnormal   Collection Time: 04/03/16  6:31 AM  Result Value Ref Range   Glucose-Capillary 231 (H) 65 - 99 mg/dL   Comment 1 Notify RN   Glucose, capillary     Status: Abnormal   Collection Time: 04/03/16  7:18 AM  Result Value Ref Range   Glucose-Capillary 174 (H) 65 - 99 mg/dL   No results found.  Review of Systems  Constitutional: Negative for chills and fever.  HENT: Negative for sore throat and tinnitus.   Eyes: Negative for blurred vision and redness.  Respiratory: Positive for shortness of breath. Negative for cough.   Cardiovascular: Negative for chest pain, palpitations, orthopnea and PND.  Gastrointestinal: Positive for nausea. Negative for abdominal pain, diarrhea and vomiting.  Genitourinary: Negative for dysuria, frequency and urgency.  Musculoskeletal: Negative for joint pain and myalgias.  Skin: Negative for rash.       No lesions  Neurological: Negative for speech change, focal weakness and weakness.  Endo/Heme/Allergies: Does not bruise/bleed easily.       No temperature intolerance  Psychiatric/Behavioral: Negative for depression and suicidal ideas.    Blood pressure 102/68, pulse (!) 131, temperature 98.5 F (36.9 C), temperature source Oral, resp. rate  (!) 21, height 5' 1"  (1.549 m), weight 49 kg (108 lb), SpO2 100 %. Physical Exam  Vitals reviewed. Constitutional: She is oriented to person, place, and time. She appears well-developed and well-nourished. No distress.  HENT:  Head: Normocephalic and atraumatic.  Mouth/Throat: Oropharynx is clear and moist.  Eyes: Conjunctivae and EOM are normal. Pupils are equal, round, and reactive to light. No scleral icterus.  Neck: Normal range of motion. Neck supple. No JVD present. No tracheal deviation present. No thyromegaly present.  Cardiovascular: Normal rate, regular rhythm and normal heart sounds.  Exam reveals no gallop and no friction rub.   No murmur heard. Respiratory: Effort normal and breath sounds normal.  GI: Soft. Bowel sounds are normal. She exhibits no distension. There is no tenderness.  Genitourinary:  Genitourinary Comments: Deferred  Musculoskeletal: Normal range of motion. She exhibits no edema.  Lymphadenopathy:    She has no cervical adenopathy.  Neurological: She is alert and oriented to person, place, and time. No cranial nerve deficit. She exhibits normal muscle tone.  Skin: Skin is warm and dry. No rash noted. No erythema.  Psychiatric: She has a normal mood and affect. Her behavior is normal. Judgment and thought content normal.     Assessment/Plan This is a 25 year old female admitted for  DKA. 1. DKA: Continue insulin drip until anion gap is closed. Hydrate with intravenous saline and D5 to maintain osmotic balance. When gap is closed initiate long-acting insulin while the patient to eat. Respiratory rate is normal oxygen saturations normal on room air. 2. Tachycardia: Secondary to dehydration as well as deconditioning. The patient states that she feels as if she has been progressively more short of breath over the past months to a year. She does not have orthopnea or lower extremity edema to indicate congestive heart failure. Continue to hydrate. 3. UTI: Continue  ceftriaxone 4. Hypertension: Controlled for now. Continue lisinopril. 5. DVT prolactins: Lovenox 6. GI prophylaxis: None Exline the patient is a full code. Time spent on admission orders and critical care approximately 45 minutes  Harrie Foreman, MD 04/03/2016, 7:31 AM

## 2016-04-03 NOTE — Progress Notes (Signed)
Patient is aox 4, on insulin drip, npo, vs wdl, st on the monitor. Started NS 1 L bolus.will continue to  Monitor.

## 2016-04-03 NOTE — Consult Note (Signed)
[facility] CONSULT NOTE    Date: 04/03/2016                  Patient Name:  Ashley Camacho  MRN: [id number]  DOB: 1991/02/22  Age / Sex: 25 y.o., female         PCP: No PCP Per Patient                 Service Requesting Consult: Dr. Enedina Finner                 Reason for Consult: acidosis            History of Present Illness: Patient is a 25 y.o. female with a PMHx of Diabetes mellitus type 1 and hypertension, who was admitted to [facility] on 04/03/2016 for evaluation of nausea at home status post wisdom tooth removal 4 as well as additional teeth extraction.  Upon presentation her blood glucose was 349. Serum bicarbonate was very low at 8. Despite insulin drip and IV fluid hydration her serum bicarbonate remains quite low at 8. The patient is currently on D5 half-normal saline. She likely has developed hyperchloremic metabolic acidosis. Serum chloride has gone from 106 up to 116. Patient reports at home that she was not able to eat or drink and also stopped taking her insulin. Thereafter she developed diabetic ketoacidosis. She follows with [facility] endocrinology regularly. Beta hydroxybutyrate level was quite high at greater than 8.   Medications: Outpatient medications: Prescriptions Prior to Admission  Medication Sig Dispense Refill Last Dose  . acetaminophen (TYLENOL) 500 MG tablet Take 500 mg by mouth every 4 (four) hours as needed.   04/03/2016 at Unknown time  . gabapentin (NEURONTIN) 300 MG capsule Take 300 mg by mouth 3 (three) times daily.   04/02/2016 at Unknown time  . insulin aspart (NOVOLOG) 100 UNIT/ML injection Inject 1-15 Units into the skin 3 (three) times daily before meals. Per sliding scale/ 2 units  Per 15 grams of carbs. Extra 1-15 units per glucose   04/02/2016 at Unknown time  . insulin glargine (LANTUS) 100 UNIT/ML injection Inject 11 Units into the skin daily.   04/02/2016 at Unknown time  . lisinopril (PRINIVIL,ZESTRIL) 5 MG  tablet Take 5 mg by mouth daily.   Past Month at Unknown time  . medroxyPROGESTERone (DEPO-PROVERA) 150 MG/ML injection Inject 150 mg into the muscle every 3 (three) months.   02/02/2016    Current medications: Current Facility-Administered Medications  Medication Dose Route Frequency Provider Last Rate Last Dose  . 0.9 %  sodium chloride infusion   Intravenous Continuous Arnaldo Natal, MD      . cephALEXin [facility]) capsule 500 mg  500 mg Oral Q12H Enedina Finner, MD   500 mg at 04/03/16 1257  . dextrose 5 %-0.45 % sodium chloride infusion   Intravenous Continuous Arnaldo Natal, MD 75 mL/hr at 04/03/16 [phone]    . enoxaparin (LOVENOX) injection 40 mg  40 mg Subcutaneous Q24H Arnaldo Natal, MD   40 mg at 04/03/16 0646  . ibuprofen (ADVIL,MOTRIN) tablet 400 mg  400 mg Oral Q6H PRN Enedina Finner, MD      . insulin glargine (LANTUS) injection 11 Units  11 Units Subcutaneous Daily Enedina Finner, MD      . polyethylene glycol (MIRALAX / GLYCOLAX) packet 17 g  17 g Oral Daily Arnaldo Natal, MD      . potassium chloride 30 mEq in sodium chloride 0.9 %  265 mL (KCL MULTIRUN) IVPB  30 mEq Intravenous Once Arnaldo Natal, MD   30 mEq at 04/03/16 1021      Allergies: No Known Allergies    Past Medical History: Past Medical History:  Diagnosis Date  . Diabetes mellitus without complication ([facility])   . Hypertension   . Type 1 diabetes ([facility])      Past Surgical History: Past Surgical History:  Procedure Laterality Date  . CESAREAN SECTION    . MOUTH SURGERY       Family History: Family History  Problem Relation Age of Onset  . Diabetes type I Father   . CAD Father   . CAD Paternal Grandmother   . CAD Paternal Grandfather      Social History: Social History   Social History  . Marital status: Single    Spouse name: N/A  . Number of children: N/A  . Years of education: N/A   Occupational History  . Not on file.   Social History Main Topics  . Smoking status: Never  Smoker  . Smokeless tobacco: Never Used  . Alcohol use No  . Drug use: Unknown  . Sexual activity: Not on file   Other Topics Concern  . Not on file   Social History Narrative  . No narrative on file     Review of Systems: Review of Systems  Constitutional: Negative for chills, fever and weight loss.  HENT: Negative for ear pain, hearing loss and tinnitus.   Eyes: Negative for blurred vision and double vision.  Respiratory: Negative for cough and hemoptysis.   Cardiovascular: Negative for chest pain and palpitations.  Gastrointestinal: Positive for nausea. Negative for abdominal pain and heartburn.  Genitourinary: Negative for dysuria, frequency and urgency.  Musculoskeletal: Negative for myalgias and neck pain.  Skin: Negative for itching and rash.  Neurological: Negative for dizziness and focal weakness.  Endo/Heme/Allergies: Negative for polydipsia. Does not bruise/bleed easily.  Psychiatric/Behavioral: Negative for depression. The patient is not nervous/anxious.      Vital Signs: Blood pressure (!) 98/59, pulse (!) 110, temperature 98.5 F (36.9 C), temperature source Oral, resp. rate 19, height 5\' 1"  (1.549 m), weight 49 kg (108 lb), SpO2 100 %.  Weight trends: Filed Weights   04/02/16 2357 04/03/16 0020  Weight: 49 kg (108 lb) 49 kg (108 lb)    Physical Exam: General: NAD,   Head: Normocephalic, atraumatic.  Eyes: Anicteric, EOMI  Nose: Mucous membranes moist, not inflammed, nonerythematous.  Throat: Oropharynx nonerythematous, no exudate appreciated.   Neck: Supple, trachea midline.  Lungs:  Normal respiratory effort. Clear to auscultation BL without crackles or wheezes.  Heart: RRR. S1 and S2 normal without gallop, murmur, or rubs.  Abdomen:  BS normoactive. Soft, Nondistended, non-tender.  No masses or organomegaly.  Extremities: No pretibial edema.  Neurologic: A&O X3, Motor strength is 5/5 in the all 4 extremities  Skin: No visible rashes, scars.     Lab results: Basic Metabolic Panel:  Recent Labs Lab 04/03/16 0201 04/03/16 0617 04/03/16 1043  NA 136 138 137  K 4.8 3.8 4.2  CL 106 113* 116*  CO2 8* 8* 8*  GLUCOSE 349* 246* 124*  BUN 17 16 15   CREATININE 0.88 1.01* 0.70  CALCIUM 9.1 8.7* 8.2*    Liver Function Tests:  Recent Labs Lab 04/03/16 0201  AST 11*  ALT 10*  ALKPHOS 68  BILITOT 1.7*  PROT 8.2*  ALBUMIN 4.4   No results for input(s): LIPASE, AMYLASE in the  last 168 hours. No results for input(s): AMMONIA in the last 168 hours.  CBC:  Recent Labs Lab 04/03/16 0201  WBC 13.7*  NEUTROABS 10.9*  HGB 12.3  HCT 37.1  MCV 90.2  PLT 333    Cardiac Enzymes: No results for input(s): CKTOTAL, CKMB, CKMBINDEX, TROPONINI in the last 168 hours.  BNP: Invalid input(s): POCBNP  CBG:  Recent Labs Lab 04/03/16 0814 04/03/16 0912 04/03/16 1015 04/03/16 1116 04/03/16 1217  GLUCAP 106* 108* 101* 131* 137*    Microbiology: Results for orders placed or performed during the hospital encounter of 04/03/16  MRSA PCR Screening     Status: None   Collection Time: 04/03/16  5:54 AM  Result Value Ref Range Status   MRSA by PCR NEGATIVE NEGATIVE Final    Comment:        The GeneXpert MRSA Assay (FDA approved for NASAL specimens only), is one component of a comprehensive MRSA colonization surveillance program. It is not intended to diagnose MRSA infection nor to guide or monitor treatment for MRSA infections.     Coagulation Studies: No results for input(s): LABPROT, INR in the last 72 hours.  Urinalysis:  Recent Labs  04/03/16 0348  COLORURINE YELLOW*  LABSPEC 1.021  PHURINE 5.0  GLUCOSEU >500*  HGBUR NEGATIVE  BILIRUBINUR NEGATIVE  KETONESUR 2+*  PROTEINUR 100*  NITRITE NEGATIVE  LEUKOCYTESUR 1+*      Imaging:  No results found.   Assessment & Plan: Pt is a 25 y.o. female with a PMHx of Diabetes mellitus type 1 and hypertension, who was [facility]2016 for  evaluation of nausea at home status post wisdom tooth removal 4 as well as additional teeth extraction.   1. Diabetic ketoacidosis without coma with concomitant hyperchloremic metabolic acidosis. 2. Proteinuria.  Plan: We were asked to see the patient for underlying acidosis. Serum bicarbonate remains low at 8. However hyperchloremia has also developed. She likely has developed hyperchloremic metabolic acidosis as a result of saline administration. Therefore we will discontinue saline for now in favor of sodium bicarbonate drip.  Continue to monitor blood glucose closely. In addition she was found have proteinuria which likely should be further evaluated and treated as an outpatient as she is at risk for development of chronic kidney disease given her underlying diabetes mellitus type 1.

## 2016-04-03 NOTE — ED Notes (Signed)
Lab called for venipuncture assistance. Danelle Earthly, rn notified of need for iv ultrasound. The 24g cath in right hand is infusing ns, very slowly, will take approx 4-6 hours at rate, pump will not infuse fluid without ringing "occulsion" due to size of catheter. No s/s of infiltration noted at iv site. Pt denies pain to hand.

## 2016-04-03 NOTE — ED Notes (Signed)
Verified insulin with Caleen Jobs, RN.

## 2016-04-03 NOTE — Progress Notes (Signed)
[facility] [place] at [facility]   PATIENT NAME: Ashley Camacho    MR#:  [id number]  DATE OF BIRTH:  07-Jun-1990  SUBJECTIVE:  Came with nausea and found tohave DKA Had recent wisdom teeth pulled.   REVIEW OF SYSTEMS:   Review of Systems  Constitutional: Negative for chills, fever and weight loss.  HENT: Negative for ear discharge, ear pain and nosebleeds.   Eyes: Negative for blurred vision, pain and discharge.  Respiratory: Negative for sputum production, shortness of breath, wheezing and stridor.   Cardiovascular: Negative for chest pain, palpitations, orthopnea and PND.  Gastrointestinal: Negative for abdominal pain, diarrhea, nausea and vomiting.  Genitourinary: Negative for flank pain, frequency and urgency.  Musculoskeletal: Negative for back pain and joint pain.  Neurological: Positive for weakness. Negative for sensory change, speech change and focal weakness.  Psychiatric/Behavioral: Negative for depression and hallucinations. The patient is not nervous/anxious.    Tolerating Diet:FLD Tolerating PT: ambulatory  DRUG ALLERGIES:  No Known Allergies  VITALS:  Blood pressure (!) 98/59, pulse (!) 110, temperature 98.5 F (36.9 C), temperature source Oral, resp. rate 19, height 5\' 1"  (1.549 m), weight 49 kg (108 lb), SpO2 100 %.  PHYSICAL EXAMINATION:   Physical Exam  GENERAL:  25 y.o.-year-old patient lying in the bed with no acute distress.  EYES: Pupils equal, round, reactive to light and accommodation. No scleral icterus. Extraocular muscles intact.  HEENT: Head atraumatic, normocephalic. Oropharynx and nasopharynx clear.  NECK:  Supple, no jugular venous distention. No thyroid enlargement, no tenderness.  LUNGS: Normal breath sounds bilaterally, no wheezing, rales, rhonchi. No use of accessory muscles of respiration.  CARDIOVASCULAR: S1, S2 normal. No murmurs, rubs, or gallops.  ABDOMEN: Soft, nontender, nondistended. Bowel sounds  present. No organomegaly or mass.  EXTREMITIES: No cyanosis, clubbing or edema b/l.    NEUROLOGIC: Cranial nerves II through XII are intact. No focal Motor or sensory deficits b/l.   PSYCHIATRIC:  patient is alert and oriented x 3.  SKIN: No obvious rash, lesion, or ulcer.   LABORATORY PANEL:  CBC  Recent Labs Lab 04/03/16 0201  WBC 13.7*  HGB 12.3  HCT 37.1  PLT 333    Chemistries   Recent Labs Lab 04/03/16 0201  04/03/16 1043  NA 136  < > 137  K 4.8  < > 4.2  CL 106  < > 116*  CO2 8*  < > 8*  GLUCOSE 349*  < > 124*  BUN 17  < > 15  CREATININE 0.88  < > 0.70  CALCIUM 9.1  < > 8.2*  AST 11*  --   --   ALT 10*  --   --   ALKPHOS 68  --   --   BILITOT 1.7*  --   --   < > = values in this interval not displayed. Cardiac Enzymes No results for input(s): TROPONINI in the last 168 hours. RADIOLOGY:  No results found. ASSESSMENT AND PLAN:  25 year old female admitted for DKA. 1. DKA:  -was on IV insulin drip until anion gap is closed.  -AG closed bt remains acidotic -spoke with Dr Cherylann Ratel who recommends bicarb gtt. Nephro consult placed  2. Tachycardia: Secondary to dehydration as well as deconditioning. T  3. UTI: Continue ceftriaxone---change to po keflex  4. Hypertension: Controlled for now. Continue lisinopril.  5. DVT prolactins: Lovenox   transfer to regular floor  Case discussed with Care Management/Social Worker. Management plans discussed with the patient, family and  they are in agreement.  CODE STATUS: full  DVT Prophylaxis: ambulatory  TOTAL TIME TAKING CARE OF THIS PATIENT: 30 minutes.  >50% time spent on counselling and coordination of care  POSSIBLE D/C IN 1-2DAYS, DEPENDING ON CLINICAL CONDITION.  Note: This dictation was prepared with Dragon dictation along with smaller phrase technology. Any transcriptional errors that result from this process are unintentional.  Yanis Larin M.D on 04/03/2016 at 12:31 PM  Between 7am to 6pm - Pager -  [phone]  After 6pm go to [email or web address] - password [email or web address]  [facility]  Office  [phone]  CC: Primary care physician; No PCP Per Patient

## 2016-04-04 LAB — BASIC METABOLIC PANEL
ANION GAP: 9 (ref 5–15)
ANION GAP: 7 (ref 5–15)
BUN: 7 mg/dL (ref 6–20)
BUN: 7 mg/dL (ref 6–20)
CALCIUM: 8.5 mg/dL — AB (ref 8.9–10.3)
CALCIUM: 8.4 mg/dL — AB (ref 8.9–10.3)
CO2: 23 mmol/L (ref 22–32)
CO2: 22 mmol/L (ref 22–32)
CREATININE: 0.51 mg/dL (ref 0.44–1.00)
CREATININE: 0.48 mg/dL (ref 0.44–1.00)
Chloride: 107 mmol/L (ref 101–111)
Chloride: 108 mmol/L (ref 101–111)
GFR calc Af Amer: >60 mL/min (ref >60)
GFR calc Af Amer: >60 mL/min (ref >60)
GLUCOSE: 179 mg/dL — AB (ref 65–99)
GLUCOSE: 155 mg/dL — AB (ref 65–99)
Potassium: 3.3 mmol/L — ABNORMAL LOW (ref 3.5–5.1)
Potassium: 3.7 mmol/L (ref 3.5–5.1)
Sodium: 139 mmol/L (ref 135–145)
Sodium: 137 mmol/L (ref 135–145)

## 2016-04-04 LAB — URINE CULTURE
CULTURE: NO GROWTH
SPECIAL REQUESTS: NORMAL

## 2016-04-04 LAB — GLUCOSE, CAPILLARY: Glucose-Capillary: 142 mg/dL — ABNORMAL HIGH (ref 65–99)

## 2016-04-04 MED ORDER — ONDANSETRON HCL 4 MG/2ML IJ SOLN
4.0000 mg | Freq: Four times a day (QID) | INTRAMUSCULAR | Status: DC | PRN
  Administered 2016-04-04: 4 mg via INTRAVENOUS
  Filled 2016-04-04: qty 2

## 2016-04-04 MED ORDER — CEPHALEXIN 500 MG PO CAPS: 500.0000 mg | ORAL_CAPSULE | Freq: Two times a day (BID) | ORAL | 0 refills | Status: DC

## 2016-04-04 MED ORDER — IBUPROFEN 400 MG PO TABS: 400.0000 mg | ORAL_TABLET | Freq: Three times a day (TID) | ORAL | 0 refills | Status: DC | PRN

## 2016-04-04 MED ORDER — MORPHINE SULFATE (PF) 2 MG/ML IV SOLN
1.0000 mg | INTRAVENOUS | Status: DC | PRN
  Administered 2016-04-04: 1 mg via INTRAVENOUS
  Filled 2016-04-04: qty 1

## 2016-04-04 NOTE — Discharge Summary (Signed)
[facility] - [place] at [facility]   PATIENT NAME: Ashley Camacho    MR#:  [id number]  DATE OF BIRTH:  07-13-1990  DATE OF ADMISSION:  04/03/2016 ADMITTING PHYSICIAN: Arnaldo Natal, MD  DATE OF DISCHARGE: 04/04/16  PRIMARY CARE PHYSICIAN: No PCP Per Patient    ADMISSION DIAGNOSIS:  Post-operative pain [G89.18] Urinary tract infection without hematuria, site unspecified [N39.0] Diabetic ketoacidosis without coma associated with diabetes mellitus due to underlying condition (HCC) [E08.10]  DISCHARGE DIAGNOSIS:  DKA-resolved Hyperchloremic metabolic acidosis-resolved UTI Recent wisdom teeth removal  SECONDARY DIAGNOSIS:   Past Medical History:  Diagnosis Date  . Diabetes mellitus without complication (HCC)   . Hypertension   . Type 1 diabetes [facility])     HOSPITAL COURSE:   25 year old female admitted for DKA.  1. DKA and hyperchloremic metabolic acidosis -was on IV insulin drip until anion gap is closed.  -AG closed bt remains acidotic -spoke with Dr Cherylann Ratel who recommends bicarb gtt. Nephro consult appreciated Acidosis corrected  2. Tachycardia: resolved  3. UTI: Continue ceftriaxone---change to po keflex  4. Hypertension: Controlled for now. Continue lisinopril.  5. DVT prolactins: Lovenox  Overall stable D/c home CONSULTS OBTAINED:  Treatment Team:  [place] Cherylann Ratel, MD  DRUG ALLERGIES:  No Known Allergies  DISCHARGE MEDICATIONS:   Current Discharge Medication List    START taking these medications   Details  cephALEXin (KEFLEX) 500 MG capsule Take 1 capsule (500 mg total) by mouth every 12 (twelve) hours. Qty: 6 capsule, Refills: 0    ibuprofen (ADVIL,MOTRIN) 400 MG tablet Take 1 tablet (400 mg total) by mouth every 8 (eight) hours as needed for headache. Qty: 5 tablet, Refills: 0      CONTINUE these medications which have NOT CHANGED   Details  acetaminophen (TYLENOL) 500 MG tablet Take 500 mg by mouth every 4  (four) hours as needed.    gabapentin (NEURONTIN) 300 MG capsule Take 300 mg by mouth 3 (three) times daily.    insulin aspart (NOVOLOG) 100 UNIT/ML injection Inject 1-15 Units into the skin 3 (three) times daily before meals. Per sliding scale/ 2 units  Per 15 grams of carbs. Extra 1-15 units per glucose    insulin glargine (LANTUS) 100 UNIT/ML injection Inject 11 Units into the skin daily.    lisinopril (PRINIVIL,ZESTRIL) 5 MG tablet Take 5 mg by mouth daily.    medroxyPROGESTERone (DEPO-PROVERA) 150 MG/ML injection Inject 150 mg into the muscle every 3 (three) months.        If you experience worsening of your admission symptoms, develop shortness of breath, life threatening emergency, suicidal or homicidal thoughts you must seek medical attention immediately by calling 911 or calling your MD immediately  if symptoms less severe.  You Must read complete instructions/literature along with all the possible adverse reactions/side effects for all the Medicines you take and that have been prescribed to you. Take any new Medicines after you have completely understood and accept all the possible adverse reactions/side effects.   Please note  You were cared for by a hospitalist during your hospital stay. If you have any questions about your discharge medications or the care you received while you were in the hospital after you are discharged, you can call the unit and asked to speak with the hospitalist on call if the hospitalist that took care of you is not available. Once you are discharged, your primary care physician will handle any further medical issues. Please note that NO REFILLS for  any discharge medications will be authorized once you are discharged, as it is imperative that you return to your primary care physician (or establish a relationship with a primary care physician if you do not have one) for your aftercare needs so that they can reassess your need for medications and monitor your  lab values. Today   SUBJECTIVE   Doing ok Tooth pain  VITAL SIGNS:  Blood pressure 111/67, pulse 89, temperature 98.2 F (36.8 C), temperature source Oral, resp. rate 18, height 5\' 1"  (1.549 m), weight 49 kg (108 lb), SpO2 99 %.  I/O:   Intake/Output Summary (Last 24 hours) at 04/04/16 0745 Last data filed at 04/04/16 0500  Gross per 24 hour  Intake           906.53 ml  Output              675 ml  Net           231.53 ml    PHYSICAL EXAMINATION:  GENERAL:  25 y.o.-year-old patient lying in the bed with no acute distress.  EYES: Pupils equal, round, reactive to light and accommodation. No scleral icterus. Extraocular muscles intact.  HEENT: Head atraumatic, normocephalic. Oropharynx and nasopharynx clear.  NECK:  Supple, no jugular venous distention. No thyroid enlargement, no tenderness.  LUNGS: Normal breath sounds bilaterally, no wheezing, rales,rhonchi or crepitation. No use of accessory muscles of respiration.  CARDIOVASCULAR: S1, S2 normal. No murmurs, rubs, or gallops.  ABDOMEN: Soft, non-tender, non-distended. Bowel sounds present. No organomegaly or mass.  EXTREMITIES: No pedal edema, cyanosis, or clubbing.  NEUROLOGIC: Cranial nerves II through XII are intact. Muscle strength 5/5 in all extremities. Sensation intact. Gait not checked.  PSYCHIATRIC: The patient is alert and oriented x 3.  SKIN: No obvious rash, lesion, or ulcer.   DATA REVIEW:   CBC   Recent Labs Lab 04/03/16 0201  WBC 13.7*  HGB 12.3  HCT 37.1  PLT 333    Chemistries   Recent Labs Lab 04/03/16 0201  04/04/16 0627  NA 136  < > 137  K 4.8  < > 3.7  CL 106  < > 108  CO2 8*  < > 22  GLUCOSE 349*  < > 155*  BUN 17  < > 7  CREATININE 0.88  < > 0.48  CALCIUM 9.1  < > 8.4*  AST 11*  --   --   ALT 10*  --   --   ALKPHOS 68  --   --   BILITOT 1.7*  --   --   < > = values in this interval not displayed.  Microbiology Results   Recent Results (from the past 240 hour(s))  MRSA PCR  Screening     Status: None   Collection Time: 04/03/16  5:54 AM  Result Value Ref Range Status   MRSA by PCR NEGATIVE NEGATIVE Final    Comment:        The GeneXpert MRSA Assay (FDA approved for NASAL specimens only), is one component of a comprehensive MRSA colonization surveillance program. It is not intended to diagnose MRSA infection nor to guide or monitor treatment for MRSA infections.     RADIOLOGY:  No results found.   Management plans discussed with the patient, family and they are in agreement.  CODE STATUS:     Code Status Orders        Start     Ordered   04/03/16 0611  Full code  Continuous  04/03/16 0610    Code Status History    Date Active Date Inactive Code Status Order ID Comments User Context   This patient has a current code status but no historical code status.      TOTAL TIME TAKING CARE OF THIS PATIENT: 40 minutes.    Marquice Uddin M.D on 04/04/2016 at 7:45 AM  Between 7am to 6pm - Pager - [phone] After 6pm go to [email or web address] - password [email or web address]  [place]  [facility]  Office  [phone]  CC: Primary care physician; No PCP Per Patient

## 2016-04-04 NOTE — Discharge Instructions (Signed)
Keep log of your sugars and take your insulin as before

## 2016-04-04 NOTE — Progress Notes (Signed)
Patient was discharged home via family. IVs removed with caths intact. Scripts and last dose of meds reviewed. Tele removed. Allowed time for questions.

## 2016-04-19 DIAGNOSIS — E1142 Type 2 diabetes mellitus with diabetic polyneuropathy: Secondary | ICD-10-CM | POA: Insufficient documentation

## 2016-04-22 DIAGNOSIS — K3184 Gastroparesis: Secondary | ICD-10-CM | POA: Diagnosis present

## 2016-04-22 DIAGNOSIS — E1143 Type 2 diabetes mellitus with diabetic autonomic (poly)neuropathy: Secondary | ICD-10-CM | POA: Diagnosis present

## 2016-05-13 ENCOUNTER — Emergency Department: Payer: Medicaid Other

## 2016-05-13 ENCOUNTER — Inpatient Hospital Stay
Admission: EM | Admit: 2016-05-13 | Discharge: 2016-05-15 | DRG: 639 | Disposition: A | Discharge status: Home Health | Payer: Medicaid Other | Attending: Internal Medicine | Admitting: Internal Medicine

## 2016-05-13 DIAGNOSIS — J029 Acute pharyngitis, unspecified: Secondary | ICD-10-CM | POA: Diagnosis present

## 2016-05-13 DIAGNOSIS — E876 Hypokalemia: Secondary | ICD-10-CM | POA: Diagnosis present

## 2016-05-13 DIAGNOSIS — E860 Dehydration: Secondary | ICD-10-CM | POA: Diagnosis present

## 2016-05-13 DIAGNOSIS — E1043 Type 1 diabetes mellitus with diabetic autonomic (poly)neuropathy: Secondary | ICD-10-CM | POA: Diagnosis present

## 2016-05-13 DIAGNOSIS — F4323 Adjustment disorder with mixed anxiety and depressed mood: Secondary | ICD-10-CM | POA: Diagnosis present

## 2016-05-13 DIAGNOSIS — Z79899 Other long term (current) drug therapy: Secondary | ICD-10-CM

## 2016-05-13 DIAGNOSIS — E1110 Type 2 diabetes mellitus with ketoacidosis without coma: Secondary | ICD-10-CM | POA: Diagnosis present

## 2016-05-13 DIAGNOSIS — I10 Essential (primary) hypertension: Secondary | ICD-10-CM | POA: Diagnosis present

## 2016-05-13 DIAGNOSIS — D649 Anemia, unspecified: Secondary | ICD-10-CM | POA: Diagnosis present

## 2016-05-13 DIAGNOSIS — Z8249 Family history of ischemic heart disease and other diseases of the circulatory system: Secondary | ICD-10-CM

## 2016-05-13 DIAGNOSIS — E1042 Type 1 diabetes mellitus with diabetic polyneuropathy: Secondary | ICD-10-CM | POA: Diagnosis present

## 2016-05-13 DIAGNOSIS — K3184 Gastroparesis: Secondary | ICD-10-CM | POA: Diagnosis present

## 2016-05-13 DIAGNOSIS — Z794 Long term (current) use of insulin: Secondary | ICD-10-CM

## 2016-05-13 DIAGNOSIS — R079 Chest pain, unspecified: Secondary | ICD-10-CM

## 2016-05-13 DIAGNOSIS — R000 Tachycardia, unspecified: Secondary | ICD-10-CM

## 2016-05-13 DIAGNOSIS — Z9119 Patient's noncompliance with other medical treatment and regimen: Secondary | ICD-10-CM

## 2016-05-13 DIAGNOSIS — E1010 Type 1 diabetes mellitus with ketoacidosis without coma: Principal | ICD-10-CM | POA: Diagnosis present

## 2016-05-13 DIAGNOSIS — Z833 Family history of diabetes mellitus: Secondary | ICD-10-CM

## 2016-05-13 LAB — CBC
HEMATOCRIT: 38 % (ref 35.0–47.0)
HEMOGLOBIN: 13 g/dL (ref 12.0–16.0)
MCH: 29.9 pg (ref 26.0–34.0)
MCHC: 34.2 g/dL (ref 32.0–36.0)
MCV: 87.3 fL (ref 80.0–100.0)
Platelets: 215 10*3/uL (ref 150–440)
RBC: 4.36 MIL/uL (ref 3.80–5.20)
RDW: 14.2 % (ref 11.5–14.5)
WBC: 7.9 10*3/uL (ref 3.6–11.0)

## 2016-05-13 LAB — BASIC METABOLIC PANEL
ANION GAP: 16 — AB (ref 5–15)
Anion gap: 16 — ABNORMAL HIGH (ref 5–15)
BUN: 22 mg/dL — AB (ref 6–20)
BUN: 19 mg/dL (ref 6–20)
CALCIUM: 8 mg/dL — AB (ref 8.9–10.3)
CHLORIDE: 98 mmol/L — AB (ref 101–111)
CHLORIDE: 108 mmol/L (ref 101–111)
CO2: 22 mmol/L (ref 22–32)
CO2: 13 mmol/L — AB (ref 22–32)
CREATININE: 0.91 mg/dL (ref 0.44–1.00)
Calcium: 9.9 mg/dL (ref 8.9–10.3)
Creatinine, Ser: 0.95 mg/dL (ref 0.44–1.00)
GFR calc Af Amer: >60 mL/min (ref >60)
GFR calc Af Amer: >60 mL/min (ref >60)
GFR calc non Af Amer: >60 mL/min (ref >60)
GFR calc non Af Amer: >60 mL/min (ref >60)
GLUCOSE: 211 mg/dL — AB (ref 65–99)
Glucose, Bld: 334 mg/dL — ABNORMAL HIGH (ref 65–99)
POTASSIUM: 4.5 mmol/L (ref 3.5–5.1)
Potassium: 4.2 mmol/L (ref 3.5–5.1)
Sodium: 136 mmol/L (ref 135–145)
Sodium: 137 mmol/L (ref 135–145)

## 2016-05-13 LAB — POCT PREGNANCY, URINE: Preg Test, Ur: NEGATIVE

## 2016-05-13 LAB — FIBRIN DERIVATIVES D-DIMER (ARMC ONLY): FIBRIN DERIVATIVES D-DIMER (ARMC): 824 — AB (ref 0–499)

## 2016-05-13 LAB — GLUCOSE, CAPILLARY
GLUCOSE-CAPILLARY: 204 mg/dL — AB (ref 65–99)
GLUCOSE-CAPILLARY: 280 mg/dL — AB (ref 65–99)

## 2016-05-13 LAB — TROPONIN I: Troponin I: 0.03 ng/mL (ref <0.03)

## 2016-05-13 LAB — POCT RAPID STREP A: STREPTOCOCCUS, GROUP A SCREEN (DIRECT): NEGATIVE

## 2016-05-13 MED ORDER — MORPHINE SULFATE (PF) 2 MG/ML IV SOLN
INTRAVENOUS | Status: AC
  Filled 2016-05-13: qty 1

## 2016-05-13 MED ORDER — SODIUM CHLORIDE 0.9 % IV BOLUS (SEPSIS)
1000.0000 mL | Freq: Once | INTRAVENOUS | Status: AC
  Administered 2016-05-13: 1000 mL via INTRAVENOUS

## 2016-05-13 MED ORDER — SODIUM CHLORIDE 0.9 % IV BOLUS (SEPSIS)
1000.0000 mL | Freq: Once | INTRAVENOUS | Status: AC
  Administered 2016-05-14: 1000 mL via INTRAVENOUS

## 2016-05-13 MED ORDER — ONDANSETRON HCL 4 MG/2ML IJ SOLN
4.0000 mg | Freq: Once | INTRAMUSCULAR | Status: AC
  Administered 2016-05-13: 4 mg via INTRAVENOUS

## 2016-05-13 MED ORDER — ONDANSETRON HCL 4 MG/2ML IJ SOLN
INTRAMUSCULAR | Status: AC
  Filled 2016-05-13: qty 2

## 2016-05-13 MED ORDER — MORPHINE SULFATE (PF) 4 MG/ML IV SOLN
2.0000 mg | Freq: Once | INTRAVENOUS | Status: AC
  Administered 2016-05-14: 2 mg via INTRAVENOUS
  Filled 2016-05-13: qty 1

## 2016-05-13 MED ORDER — MORPHINE SULFATE (PF) 4 MG/ML IV SOLN
2.0000 mg | Freq: Once | INTRAVENOUS | Status: AC
  Administered 2016-05-13: 2 mg via INTRAVENOUS

## 2016-05-13 NOTE — ED Notes (Signed)
Reported patient's POCT CBG to MD York Cerise

## 2016-05-13 NOTE — ED Notes (Signed)
Pt unable to void at this time. 

## 2016-05-13 NOTE — ED Provider Notes (Signed)
[facility] Emergency Department Provider Note  ____________________________________________   First MD Initiated Contact with Patient 05/13/16 1845     (approximate)  I have reviewed the triage vital signs and the nursing notes.   HISTORY  Chief Complaint Sore Throat   HPI Ashley BlazerMirisha P Cleon Gustinshby is a 25 y.o. female with a history of diabetes as well as hypertension is presenting to the emergency department today with 3 days of sore throat. She is denying any fever. She says that the throat pain is not associated with cough however she does report shortness of breath with chest pain. Says the chest pain comes and goes and worsens when she sits up. She says it feels like a sharp pain to the middle of her chest and radiates upward and downward towards the neck and stomach, respectively. He says that she has had multiple admissions over the past several months to multiple different medical centers for similar symptoms but not with the sore throat.   Past Medical History:  Diagnosis Date  . Diabetes mellitus without complication (HCC)   . Hypertension   . Type 1 diabetes [facility](HCC)     Patient Active Problem List   Diagnosis Date Noted  . DKA (diabetic ketoacidoses) (HCC) 04/03/2016    Past Surgical History:  Procedure Laterality Date  . CESAREAN SECTION    . MOUTH SURGERY      Prior to Admission medications   Medication Sig Start Date End Date Taking? Authorizing Provider  acetaminophen (TYLENOL) 500 MG tablet Take 500 mg by mouth every 4 (four) hours as needed.    Historical Provider, MD  cephALEXin (KEFLEX) 500 MG capsule Take 1 capsule (500 mg total) by mouth every 12 (twelve) hours. 04/04/16   Enedina FinnerSona Patel, MD  gabapentin (NEURONTIN) 300 MG capsule Take 300 mg by mouth 3 (three) times daily.    Historical Provider, MD  ibuprofen (ADVIL,MOTRIN) 400 MG tablet Take 1 tablet (400 mg total) by mouth every 8 (eight) hours as needed for headache. 04/04/16   Enedina FinnerSona  Patel, MD  insulin aspart (NOVOLOG) 100 UNIT/ML injection Inject 1-15 Units into the skin 3 (three) times daily before meals. Per sliding scale/ 2 units  Per 15 grams of carbs. Extra 1-15 units per glucose    Historical Provider, MD  insulin glargine (LANTUS) 100 UNIT/ML injection Inject 11 Units into the skin daily.    Historical Provider, MD  lisinopril (PRINIVIL,ZESTRIL) 5 MG tablet Take 5 mg by mouth daily.    Historical Provider, MD  medroxyPROGESTERone (DEPO-PROVERA) 150 MG/ML injection Inject 150 mg into the muscle every 3 (three) months.    Historical Provider, MD    Allergies Patient has no known allergies.  Family History  Problem Relation Age of Onset  . Diabetes type I Father   . CAD Father   . CAD Paternal Grandmother   . CAD Paternal Grandfather     Social History Social History  Substance Use Topics  . Smoking status: Never Smoker  . Smokeless tobacco: Never Used  . Alcohol use No    Review of Systems Constitutional: No fever/chills Eyes: No visual changes. ENT: No sore throat. Cardiovascular: As above Respiratory: As above Gastrointestinal: No abdominal pain.  No nausea, no vomiting.  No diarrhea.  No constipation. Genitourinary: Negative for dysuria. Musculoskeletal: Negative for back pain. Skin: Negative for rash. Neurological: Negative for headaches, focal weakness or numbness.  10-point ROS otherwise negative.  ____________________________________________   PHYSICAL EXAM:  VITAL SIGNS: ED Triage Vitals  Enc Vitals Group     BP 05/13/16 1808 106/74     Pulse Rate 05/13/16 1808 (!) 155     Resp 05/13/16 1808 20     Temp 05/13/16 1808 98.1 F (36.7 C)     Temp Source 05/13/16 1808 Oral     SpO2 05/13/16 1808 99 %     Weight 05/13/16 1809 104 lb (47.2 kg)     Height 05/13/16 1809 5\' 1"  (1.549 m)     Head Circumference --      Peak Flow --      Pain Score 05/13/16 1801 8     Pain Loc --      Pain Edu? --      Excl. in GC? --      Constitutional: Alert and oriented. Well appearing and in no acute distress. Eyes: Conjunctivae are normal. PERRL. EOMI. Head: Atraumatic. Nose: No congestion/rhinnorhea. Mouth/Throat: Dry membranes are moist.  Oropharynx non-erythematous. Neck: No stridor.  Mild, tender anterior cervical lymphadenopathy.  Cardiovascular: Tachycardic, regular rhythm. Grossly normal heart sounds.  Good peripheral circulation. Respiratory: Normal respiratory effort.  No retractions. Lungs CTAB. Gastrointestinal: Soft and nontender. No distention.  Musculoskeletal: No lower extremity tenderness nor edema.  No joint effusions. Neurologic:  Normal speech and language. No gross focal neurologic deficits are appreciated.  Skin:  Skin is warm, dry and intact. No rash noted. Psychiatric: Mood and affect are normal. Speech and behavior are normal.  ____________________________________________   LABS (all labs ordered are listed, but only abnormal results are displayed)  Labs Reviewed  GLUCOSE, CAPILLARY - Abnormal; Notable for the following:       Result Value   Glucose-Capillary 204 (*)    All other components within normal limits  BASIC METABOLIC PANEL - Abnormal; Notable for the following:    Chloride 98 (*)    Glucose, Bld 211 (*)    BUN 22 (*)    Anion gap 16 (*)    All other components within normal limits  CBC  URINALYSIS, COMPLETE (UACMP) WITH MICROSCOPIC  BLOOD GAS, ARTERIAL  URINE DRUG SCREEN, QUALITATIVE ([facility] ONLY)  TROPONIN I  FIBRIN DERIVATIVES D-DIMER ([facility] ONLY)  CBG MONITORING, ED  POCT RAPID STREP A   ____________________________________________  EKG  ED ECG REPORT I, Arelia Longest, the attending physician, personally viewed and interpreted this ECG.   Date: 05/13/2016  EKG Time: 1832  Rate: 157  Rhythm: sinus tachycardia  Axis: Normal axis  Intervals:none  ST&T Change: No ST segment elevation or depression. No abnormal T-wave  inversion.  ____________________________________________  RADIOLOGY  Imaging Results     DG Chest 2 View (Final result)  Result time 05/13/16 21:25:27  Final result by Charlott Holler, MD (05/13/16 21:25:27)           Narrative:   CLINICAL DATA: Acute onset chest pain and weakness  EXAM: CHEST 2 VIEW  COMPARISON: None.  FINDINGS: The heart size and mediastinal contours are within normal limits. Both lungs are clear. The visualized skeletal structures are unremarkable.  IMPRESSION: No active cardiopulmonary disease.   Electronically Signed By: Ellery Plunk M.D. On: 05/13/2016 21:25            DG Neck Soft Tissue (Final result)  Result time 05/13/16 40:98:11  Final result by Charlott Holler, MD (05/13/16 91:47:82)           Narrative:   CLINICAL DATA: Sore throat and chest pain for 2 days. Hyperglycemia.  EXAM: NECK SOFT TISSUES -  1+ VIEW  COMPARISON: None.  FINDINGS: There is no evidence of retropharyngeal soft tissue swelling or epiglottic enlargement. The cervical airway is unremarkable and no radio-opaque foreign body identified.  IMPRESSION: Negative.   Electronically Signed By: Ellery Plunk M.D. On: 05/13/2016 20:56          ____________________________________________   PROCEDURES  Procedure(s) performed:   Procedures  Critical Care performed:   ____________________________________________   INITIAL IMPRESSION / ASSESSMENT AND PLAN / ED COURSE  Pertinent labs & imaging results that were available during my care of the patient were reviewed by me and considered in my medical decision making (see chart for details).   Clinical Course   ----------------------------------------- 930PM on 05/13/2016 -----------------------------------------  Patient still tachycardic but starting second liter of fluid. Given 2 mg of morphine and Zofran for pain of her chest. Pending ABG at this time as well as  blood work. Unclear cause of the patient's tachycardia. No obvious source of infection. She said that she has been known to have tachycardia in the past but has been admitted for DKA which appears to be the cause. She has anion gap of 16 but a normal bicarbonate and glucose is only 211. Pending UDS as well as a urinalysis at this time. Site of the Dr. York Cerise.   ____________________________________________   FINAL CLINICAL IMPRESSION(S) / ED DIAGNOSES  Throat pain. Tachycardia. Chest pain.    NEW MEDICATIONS STARTED DURING THIS VISIT:  New Prescriptions   No medications on file     Note:  This document was prepared using Dragon voice recognition software and may include unintentional dictation errors.    Ashley Blazer, MD 05/13/16 2219

## 2016-05-13 NOTE — ED Notes (Signed)
Pt placed in subwait with iv fluids infusing.

## 2016-05-13 NOTE — ED Notes (Signed)
MD Schaevitz at bedside. 

## 2016-05-13 NOTE — ED Notes (Signed)
Pt c/o sore throat X 2-3 days, chest pain X 2 days, abdominal pain beginning today and high sugars today, high of 370. Pt has not been able to eat/drink.

## 2016-05-13 NOTE — ED Notes (Signed)
MD Brown at bedside.

## 2016-05-13 NOTE — ED Triage Notes (Addendum)
Pt c/o sore throat for the past 2 days..pt also c/o nausea with loss of appetite , states she is DM1 and FS >300 for the past week.Marland Kitchen

## 2016-05-13 NOTE — ED Provider Notes (Addendum)
-----------------------------------------   9:11 PM on 05/13/2016 -----------------------------------------   Blood pressure 122/86, pulse (!) 131, temperature 98.7 F (37.1 C), temperature source Oral, resp. rate 18, height 5\' 1"  (1.549 m), weight 47.2 kg, SpO2 100 %.  Assuming care from Dr. Pershing Proud.  In short, Ashley Camacho is a 25 y.o. female with a chief complaint of Sore Throat .  Refer to the original H&P for additional details.  The current plan of care is to follow up ABG, d-dimer, UA, and UDS.  Getting 2L NS.  Strep was negative.  No indication of DKA.  Anticipate discharge and outpatient follow up if workup is reassuring if tachycardia improves.     ----------------------------------------- 11:28 PM on 05/13/2016 -----------------------------------------  Due to multiple critical patients in the ED, I have not directly intervened in the care of this patient.  Transferred care to Dr. Manson Passey at 11:00pm.   [name], MD 05/13/16 2328

## 2016-05-14 ENCOUNTER — Emergency Department: Payer: Medicaid Other

## 2016-05-14 ENCOUNTER — Encounter: Payer: Self-pay | Admitting: Radiology

## 2016-05-14 DIAGNOSIS — E1042 Type 1 diabetes mellitus with diabetic polyneuropathy: Secondary | ICD-10-CM | POA: Diagnosis present

## 2016-05-14 DIAGNOSIS — F4323 Adjustment disorder with mixed anxiety and depressed mood: Secondary | ICD-10-CM | POA: Diagnosis present

## 2016-05-14 DIAGNOSIS — E1010 Type 1 diabetes mellitus with ketoacidosis without coma: Secondary | ICD-10-CM | POA: Diagnosis not present

## 2016-05-14 DIAGNOSIS — Z833 Family history of diabetes mellitus: Secondary | ICD-10-CM | POA: Diagnosis not present

## 2016-05-14 DIAGNOSIS — J029 Acute pharyngitis, unspecified: Secondary | ICD-10-CM | POA: Diagnosis present

## 2016-05-14 DIAGNOSIS — Z79899 Other long term (current) drug therapy: Secondary | ICD-10-CM | POA: Diagnosis not present

## 2016-05-14 DIAGNOSIS — Z794 Long term (current) use of insulin: Secondary | ICD-10-CM | POA: Diagnosis not present

## 2016-05-14 DIAGNOSIS — I10 Essential (primary) hypertension: Secondary | ICD-10-CM | POA: Diagnosis present

## 2016-05-14 DIAGNOSIS — K3184 Gastroparesis: Secondary | ICD-10-CM | POA: Diagnosis present

## 2016-05-14 DIAGNOSIS — Z8249 Family history of ischemic heart disease and other diseases of the circulatory system: Secondary | ICD-10-CM | POA: Diagnosis not present

## 2016-05-14 DIAGNOSIS — E1043 Type 1 diabetes mellitus with diabetic autonomic (poly)neuropathy: Secondary | ICD-10-CM | POA: Diagnosis present

## 2016-05-14 DIAGNOSIS — E860 Dehydration: Secondary | ICD-10-CM | POA: Diagnosis present

## 2016-05-14 DIAGNOSIS — E876 Hypokalemia: Secondary | ICD-10-CM | POA: Diagnosis present

## 2016-05-14 DIAGNOSIS — Z9119 Patient's noncompliance with other medical treatment and regimen: Secondary | ICD-10-CM | POA: Diagnosis not present

## 2016-05-14 DIAGNOSIS — D649 Anemia, unspecified: Secondary | ICD-10-CM | POA: Diagnosis present

## 2016-05-14 LAB — BASIC METABOLIC PANEL
ANION GAP: 9 (ref 5–15)
ANION GAP: 8 (ref 5–15)
ANION GAP: 6 (ref 5–15)
ANION GAP: 6 (ref 5–15)
ANION GAP: 6 (ref 5–15)
BUN: 13 mg/dL (ref 6–20)
BUN: 11 mg/dL (ref 6–20)
BUN: 10 mg/dL (ref 6–20)
BUN: 8 mg/dL (ref 6–20)
BUN: 8 mg/dL (ref 6–20)
CALCIUM: 8 mg/dL — AB (ref 8.9–10.3)
CALCIUM: 7.9 mg/dL — AB (ref 8.9–10.3)
CALCIUM: 7.9 mg/dL — AB (ref 8.9–10.3)
CALCIUM: 7.6 mg/dL — AB (ref 8.9–10.3)
CHLORIDE: 118 mmol/L — AB (ref 101–111)
CHLORIDE: 115 mmol/L — AB (ref 101–111)
CHLORIDE: 112 mmol/L — AB (ref 101–111)
CO2: 14 mmol/L — AB (ref 22–32)
CO2: 16 mmol/L — ABNORMAL LOW (ref 22–32)
CO2: 18 mmol/L — AB (ref 22–32)
CO2: 18 mmol/L — AB (ref 22–32)
CO2: 17 mmol/L — AB (ref 22–32)
Calcium: 8.1 mg/dL — ABNORMAL LOW (ref 8.9–10.3)
Chloride: 115 mmol/L — ABNORMAL HIGH (ref 101–111)
Chloride: 114 mmol/L — ABNORMAL HIGH (ref 101–111)
Creatinine, Ser: 0.75 mg/dL (ref 0.44–1.00)
Creatinine, Ser: 0.54 mg/dL (ref 0.44–1.00)
Creatinine, Ser: 0.52 mg/dL (ref 0.44–1.00)
Creatinine, Ser: 0.43 mg/dL — ABNORMAL LOW (ref 0.44–1.00)
Creatinine, Ser: 0.32 mg/dL — ABNORMAL LOW (ref 0.44–1.00)
GFR calc non Af Amer: >60 mL/min (ref >60)
GFR calc non Af Amer: >60 mL/min (ref >60)
GFR calc non Af Amer: >60 mL/min (ref >60)
GFR calc non Af Amer: >60 mL/min (ref >60)
GFR calc non Af Amer: >60 mL/min (ref >60)
GLUCOSE: 143 mg/dL — AB (ref 65–99)
GLUCOSE: 126 mg/dL — AB (ref 65–99)
GLUCOSE: 209 mg/dL — AB (ref 65–99)
GLUCOSE: 90 mg/dL (ref 65–99)
Glucose, Bld: 127 mg/dL — ABNORMAL HIGH (ref 65–99)
POTASSIUM: 4.5 mmol/L (ref 3.5–5.1)
POTASSIUM: 3.3 mmol/L — AB (ref 3.5–5.1)
POTASSIUM: 3.8 mmol/L (ref 3.5–5.1)
POTASSIUM: 4.6 mmol/L (ref 3.5–5.1)
POTASSIUM: 4.1 mmol/L (ref 3.5–5.1)
Sodium: 141 mmol/L (ref 135–145)
Sodium: 139 mmol/L (ref 135–145)
Sodium: 136 mmol/L (ref 135–145)
Sodium: 139 mmol/L (ref 135–145)
Sodium: 137 mmol/L (ref 135–145)

## 2016-05-14 LAB — GLUCOSE, CAPILLARY
GLUCOSE-CAPILLARY: 362 mg/dL — AB (ref 65–99)
GLUCOSE-CAPILLARY: 153 mg/dL — AB (ref 65–99)
GLUCOSE-CAPILLARY: 109 mg/dL — AB (ref 65–99)
GLUCOSE-CAPILLARY: 131 mg/dL — AB (ref 65–99)
GLUCOSE-CAPILLARY: 100 mg/dL — AB (ref 65–99)
GLUCOSE-CAPILLARY: 203 mg/dL — AB (ref 65–99)
GLUCOSE-CAPILLARY: 114 mg/dL — AB (ref 65–99)
Glucose-Capillary: 291 mg/dL — ABNORMAL HIGH (ref 65–99)
Glucose-Capillary: 208 mg/dL — ABNORMAL HIGH (ref 65–99)
Glucose-Capillary: 120 mg/dL — ABNORMAL HIGH (ref 65–99)
Glucose-Capillary: 206 mg/dL — ABNORMAL HIGH (ref 65–99)
Glucose-Capillary: 233 mg/dL — ABNORMAL HIGH (ref 65–99)
Glucose-Capillary: 151 mg/dL — ABNORMAL HIGH (ref 65–99)

## 2016-05-14 LAB — URINE DRUG SCREEN, QUALITATIVE (ARMC ONLY)
AMPHETAMINES, UR SCREEN: NOT DETECTED
BARBITURATES, UR SCREEN: NOT DETECTED
BENZODIAZEPINE, UR SCRN: NOT DETECTED
COCAINE METABOLITE, UR ~~LOC~~: NOT DETECTED
Cannabinoid 50 Ng, Ur ~~LOC~~: NOT DETECTED
MDMA (Ecstasy)Ur Screen: NOT DETECTED
METHADONE SCREEN, URINE: NOT DETECTED
OPIATE, UR SCREEN: POSITIVE — AB
Phencyclidine (PCP) Ur S: NOT DETECTED
Tricyclic, Ur Screen: POSITIVE — AB

## 2016-05-14 LAB — CBC
HEMATOCRIT: 29.3 % — AB (ref 35.0–47.0)
HEMOGLOBIN: 9.8 g/dL — AB (ref 12.0–16.0)
MCH: 29.1 pg (ref 26.0–34.0)
MCHC: 33.4 g/dL (ref 32.0–36.0)
MCV: 87 fL (ref 80.0–100.0)
Platelets: 138 10*3/uL — ABNORMAL LOW (ref 150–440)
RBC: 3.37 MIL/uL — AB (ref 3.80–5.20)
RDW: 14.1 % (ref 11.5–14.5)
WBC: 7.8 10*3/uL (ref 3.6–11.0)

## 2016-05-14 LAB — URINALYSIS, COMPLETE (UACMP) WITH MICROSCOPIC
BACTERIA UA: NONE SEEN
Bilirubin Urine: NEGATIVE
Hgb urine dipstick: NEGATIVE
KETONES UR: 80 mg/dL — AB
Nitrite: NEGATIVE
PROTEIN: 30 mg/dL — AB
Specific Gravity, Urine: 1.024 (ref 1.005–1.030)
pH: 5 (ref 5.0–8.0)

## 2016-05-14 LAB — TSH: TSH: 2.14 u[IU]/mL (ref 0.350–4.500)

## 2016-05-14 LAB — MRSA PCR SCREENING: MRSA by PCR: NEGATIVE

## 2016-05-14 MED ORDER — DEXTROSE-NACL 5-0.45 % IV SOLN
INTRAVENOUS | Status: DC
  Administered 2016-05-14 (×2): via INTRAVENOUS

## 2016-05-14 MED ORDER — ACETAMINOPHEN 325 MG PO TABS
650.0000 mg | ORAL_TABLET | ORAL | Status: DC | PRN
  Administered 2016-05-15: 650 mg via ORAL
  Filled 2016-05-14: qty 2

## 2016-05-14 MED ORDER — SODIUM CHLORIDE 0.9 % IV SOLN
30.0000 meq | Freq: Once | INTRAVENOUS | Status: AC
  Administered 2016-05-14: 30 meq via INTRAVENOUS
  Filled 2016-05-14: qty 15

## 2016-05-14 MED ORDER — METOCLOPRAMIDE HCL 10 MG PO TABS
5.0000 mg | ORAL_TABLET | Freq: Three times a day (TID) | ORAL | Status: DC
  Administered 2016-05-14 – 2016-05-15 (×2): 5 mg via ORAL
  Filled 2016-05-14 (×2): qty 1

## 2016-05-14 MED ORDER — SODIUM CHLORIDE 0.9 % IV SOLN
INTRAVENOUS | Status: AC
  Administered 2016-05-14: 06:00:00 via INTRAVENOUS

## 2016-05-14 MED ORDER — SODIUM CHLORIDE 0.9 % IV SOLN
INTRAVENOUS | Status: DC
  Administered 2016-05-14: 3 [IU]/h via INTRAVENOUS
  Filled 2016-05-14: qty 2.5

## 2016-05-14 MED ORDER — DEXTROSE-NACL 5-0.45 % IV SOLN
INTRAVENOUS | Status: DC
  Administered 2016-05-14: 03:00:00 via INTRAVENOUS

## 2016-05-14 MED ORDER — INSULIN ASPART 100 UNIT/ML ~~LOC~~ SOLN: 0.0000 [IU] | Freq: Every day | SUBCUTANEOUS | Status: DC

## 2016-05-14 MED ORDER — SODIUM CHLORIDE 0.9 % IV SOLN
INTRAVENOUS | Status: DC
  Administered 2016-05-14: 1.9 [IU]/h via INTRAVENOUS
  Filled 2016-05-14: qty 2.5

## 2016-05-14 MED ORDER — IOPAMIDOL (ISOVUE-370) INJECTION 76%: 75.0000 mL | Freq: Once | INTRAVENOUS | Status: DC | PRN

## 2016-05-14 MED ORDER — SODIUM CHLORIDE 0.9 % IV SOLN
INTRAVENOUS | Status: DC
  Administered 2016-05-14 – 2016-05-15 (×3): via INTRAVENOUS

## 2016-05-14 MED ORDER — MENTHOL 3 MG MT LOZG
1.0000 | LOZENGE | OROMUCOSAL | Status: DC | PRN
  Administered 2016-05-14 (×2): 3 mg via ORAL
  Filled 2016-05-14 (×2): qty 9

## 2016-05-14 MED ORDER — ENOXAPARIN SODIUM 40 MG/0.4ML ~~LOC~~ SOLN
40.0000 mg | SUBCUTANEOUS | Status: DC
  Administered 2016-05-15: 40 mg via SUBCUTANEOUS
  Filled 2016-05-14: qty 0.4

## 2016-05-14 MED ORDER — GABAPENTIN 300 MG PO CAPS
300.0000 mg | ORAL_CAPSULE | Freq: Three times a day (TID) | ORAL | Status: DC
  Administered 2016-05-14 – 2016-05-15 (×3): 300 mg via ORAL
  Filled 2016-05-14 (×3): qty 1

## 2016-05-14 MED ORDER — INSULIN GLARGINE 100 UNIT/ML ~~LOC~~ SOLN
11.0000 [IU] | Freq: Every day | SUBCUTANEOUS | Status: DC
  Administered 2016-05-14: 11 [IU] via SUBCUTANEOUS
  Filled 2016-05-14 (×3): qty 0.11

## 2016-05-14 MED ORDER — NYSTATIN 100000 UNIT/ML MT SUSP
5.0000 mL | Freq: Four times a day (QID) | OROMUCOSAL | Status: DC
  Administered 2016-05-14 (×2): 500000 [IU] via OROMUCOSAL
  Filled 2016-05-14 (×2): qty 5

## 2016-05-14 MED ORDER — INSULIN ASPART 100 UNIT/ML ~~LOC~~ SOLN
0.0000 [IU] | Freq: Three times a day (TID) | SUBCUTANEOUS | Status: DC
  Administered 2016-05-14: 2 [IU] via SUBCUTANEOUS
  Administered 2016-05-14: 3 [IU] via SUBCUTANEOUS
  Administered 2016-05-15: 1 [IU] via SUBCUTANEOUS
  Filled 2016-05-14: qty 2
  Filled 2016-05-14: qty 1

## 2016-05-14 NOTE — Care Management (Addendum)
Message left for Ashley Camacho with [facility] [phone] to see if they can take her for home health.  [facility], [facility], [facility], [facility], [facility], [facility], [facility], and  [facility] has declined patient due to location/address. MD please order [facility] at discharge for disease management and social worker. Currently no home health agency has accepted patient.

## 2016-05-14 NOTE — H&P (Signed)
[facility] [place] [facility]   PATIENT NAME: Ashley Camacho    MR#:  [id number]  DATE OF BIRTH:  [birth year]  DATE OF ADMISSION:  05/13/2016  PRIMARY CARE PHYSICIAN: No PCP Per Patient   REQUESTING/REFERRING PHYSICIAN:   CHIEF COMPLAINT:   Chief Complaint  Patient presents with  . Sore Throat    HISTORY OF PRESENT ILLNESS: Ashley Camacho  is a 25 y.o. female with a known history of Type 1 diabetes. As, hypertension presented to the emergency room with generalized weakness, nausea and sore throat. No history of any fever or chills. Patient has sore throat for the last few days. She also has nausea and unable to eat. Patient appeared weak and dehydrated. She was worked up in the emergency room and was found to be in acidosis and started on IV insulin drip. She appears to be dry and dehydrated. Patient also complains of chest discomfort on deep breathing. Her d-dimer was elevated in the emergency room she was worked up with CT angiogram of chest which showed no pulmonary embolism. No history of headache dizziness blurry vision. No history of syncope or seizure. Hospitalist service was consulted for further care of the patient.  PAST MEDICAL HISTORY:   Past Medical History:  Diagnosis Date  . Diabetes mellitus without complication (HCC)   . Hypertension   . Type 1 diabetes (HCC)     PAST SURGICAL HISTORY: Past Surgical History:  Procedure Laterality Date  . CESAREAN SECTION    . MOUTH SURGERY      SOCIAL HISTORY:  Social History  Substance Use Topics  . Smoking status: Never Smoker  . Smokeless tobacco: Never Used  . Alcohol use No    FAMILY HISTORY:  Family History  Problem Relation Age of Onset  . Diabetes type I Father   . CAD Father   . CAD Paternal Grandmother   . CAD Paternal Grandfather     DRUG ALLERGIES: No Known Allergies  REVIEW OF SYSTEMS:   CONSTITUTIONAL: No fever, has weakness.  EYES: No blurred or double vision.   EARS, NOSE, AND THROAT: No tinnitus or ear pain.  Has sore throat RESPIRATORY: No cough, shortness of breath, wheezing or hemoptysis.  CARDIOVASCULAR: No chest pain, orthopnea, edema.  GASTROINTESTINAL: Has nausea,  no vomiting, diarrhea or abdominal pain.  GENITOURINARY: No dysuria, hematuria.  ENDOCRINE: Has polyuria, nocturia,  HEMATOLOGY: No anemia, easy bruising or bleeding SKIN: No rash or lesion. MUSCULOSKELETAL: No joint pain or arthritis.   NEUROLOGIC: No tingling, numbness, weakness.  PSYCHIATRY: No anxiety or depression.   MEDICATIONS AT HOME:  Prior to Admission medications   Medication Sig Start Date End Date Taking? Authorizing Provider  acetaminophen (TYLENOL) 500 MG tablet Take 500 mg by mouth every 4 (four) hours as needed.   Yes Historical Provider, MD  amitriptyline (ELAVIL) 25 MG tablet Take 25 mg by mouth every evening. 04/22/16  Yes Historical Provider, MD  gabapentin (NEURONTIN) 300 MG capsule Take 300 mg by mouth 3 (three) times daily.   Yes Historical Provider, MD  ibuprofen (ADVIL,MOTRIN) 400 MG tablet Take 1 tablet (400 mg total) by mouth every 8 (eight) hours as needed for headache. 04/04/16  Yes Enedina Finner, MD  insulin aspart (NOVOLOG) 100 UNIT/ML injection Inject 1-15 Units into the skin 3 (three) times daily before meals. Per sliding scale/ 2 units  Per 15 grams of carbs. Extra 1-15 units per glucose   Yes Historical Provider, MD  insulin glargine (LANTUS) 100  UNIT/ML injection Inject 11 Units into the skin daily.   Yes Historical Provider, MD  lisinopril (PRINIVIL,ZESTRIL) 5 MG tablet Take 5 mg by mouth daily.   Yes Historical Provider, MD  medroxyPROGESTERone (DEPO-PROVERA) 150 MG/ML injection Inject 150 mg into the muscle every 3 (three) months.   Yes Historical Provider, MD  metoCLOPramide (REGLAN) 5 MG tablet Take 1 tablet by mouth 3 (three) times daily. 04/22/16  Yes Historical Provider, MD      PHYSICAL EXAMINATION:   VITAL SIGNS: Blood pressure  118/72, pulse (!) 130, temperature 98.7 F (37.1 C), temperature source Oral, resp. rate (!) 25, height 5\' 1"  (1.549 m), weight 47.2 kg (104 lb), SpO2 100 %.  GENERAL:  25 y.o.-year-old patient lying in the bed feeling weak and tired.  EYES: Pupils equal, round, reactive to light and accommodation. No scleral icterus. Extraocular muscles intact.  HEENT: Head atraumatic, normocephalic. Oropharynx dry and nasopharynx clear.  NECK:  Supple, no jugular venous distention. No thyroid enlargement, no tenderness.  LUNGS: Normal breath sounds bilaterally, no wheezing, rales,rhonchi or crepitation. No use of accessory muscles of respiration.  CARDIOVASCULAR: S1, S2 tachycardia noted. No murmurs, rubs, or gallops.  ABDOMEN: Soft, nontender, nondistended. Bowel sounds present. No organomegaly or mass.  EXTREMITIES: No pedal edema, cyanosis, or clubbing.  NEUROLOGIC: Cranial nerves II through XII are intact. Muscle strength 5/5 in all extremities. Sensation intact. Gait not checked.  PSYCHIATRIC: The patient is alert and oriented x 3.  SKIN: No obvious rash, lesion, or ulcer.   LABORATORY PANEL:   CBC  Recent Labs Lab 05/13/16 1812  WBC 7.9  HGB 13.0  HCT 38.0  PLT 215  MCV 87.3  MCH 29.9  MCHC 34.2  RDW 14.2   ------------------------------------------------------------------------------------------------------------------  Chemistries   Recent Labs Lab 05/13/16 1812 05/13/16 2313  NA 136 137  K 4.5 4.2  CL 98* 108  CO2 22 13*  GLUCOSE 211* 334*  BUN 22* 19  CREATININE 0.95 0.91  CALCIUM 9.9 8.0*   ------------------------------------------------------------------------------------------------------------------ estimated creatinine clearance is 71 mL/min (by C-G formula based on SCr of 0.91 mg/dL). ------------------------------------------------------------------------------------------------------------------  Recent Labs  05/13/16 2313  TSH 2.140     Coagulation  profile No results for input(s): INR, PROTIME in the last 168 hours. ------------------------------------------------------------------------------------------------------------------- No results for input(s): DDIMER in the last 72 hours. -------------------------------------------------------------------------------------------------------------------  Cardiac Enzymes  Recent Labs Lab 05/13/16 1812  TROPONINI 0.03*   ------------------------------------------------------------------------------------------------------------------ Invalid input(s): POCBNP  ---------------------------------------------------------------------------------------------------------------  Urinalysis    Component Value Date/Time   COLORURINE YELLOW (A) 05/13/2016 2313   APPEARANCEUR CLEAR (A) 05/13/2016 2313   APPEARANCEUR Clear 03/01/2013 1844   LABSPEC 1.024 05/13/2016 2313   LABSPEC 1.026 03/01/2013 1844   PHURINE 5.0 05/13/2016 2313   GLUCOSEU >=500 (A) 05/13/2016 2313   GLUCOSEU >=500 03/01/2013 1844   HGBUR NEGATIVE 05/13/2016 2313   BILIRUBINUR NEGATIVE 05/13/2016 2313   BILIRUBINUR Negative 03/01/2013 1844   KETONESUR 80 (A) 05/13/2016 2313   PROTEINUR 30 (A) 05/13/2016 2313   NITRITE NEGATIVE 05/13/2016 2313   LEUKOCYTESUR TRACE (A) 05/13/2016 2313   LEUKOCYTESUR Negative 03/01/2013 1844     RADIOLOGY: Dg Neck Soft Tissue  Result Date: 05/13/2016 CLINICAL DATA:  Sore throat and chest pain for 2 days. Hyperglycemia. EXAM: NECK SOFT TISSUES - 1+ VIEW COMPARISON:  None. FINDINGS: There is no evidence of retropharyngeal soft tissue swelling or epiglottic enlargement. The cervical airway is unremarkable and no radio-opaque foreign body identified. IMPRESSION: Negative. Electronically Signed   By: Rosey Bathaniel R Mitchell M.D.  On: 05/13/2016 20:56   Dg Chest 2 View  Result Date: 05/13/2016 CLINICAL DATA:  Acute onset chest pain and weakness EXAM: CHEST  2 VIEW COMPARISON:  None. FINDINGS: The  heart size and mediastinal contours are within normal limits. Both lungs are clear. The visualized skeletal structures are unremarkable. IMPRESSION: No active cardiopulmonary disease. Electronically Signed   By: Ellery Plunk M.D.   On: 05/13/2016 21:25   Ct Angio Chest Pe W And/or Wo Contrast  Result Date: 05/14/2016 CLINICAL DATA:  Sore throat and chest pain for several days. Hyperglycemia. EXAM: CT ANGIOGRAPHY CHEST WITH CONTRAST TECHNIQUE: Multidetector CT imaging of the chest was performed using the standard protocol during bolus administration of intravenous contrast. Multiplanar CT image reconstructions and MIPs were obtained to evaluate the vascular anatomy. CONTRAST:  75 mL Isovue 370 intravenous COMPARISON:  Radiographs 12217 FINDINGS: Cardiovascular: Satisfactory opacification of the pulmonary arteries to the segmental level. No evidence of pulmonary embolism. Normal heart size. No pericardial effusion. Mediastinum/Nodes: No enlarged mediastinal, hilar, or axillary lymph nodes. Thyroid gland, trachea, and esophagus demonstrate no significant findings. Lungs/Pleura: Lungs are clear. No pleural effusion or pneumothorax. Upper Abdomen: No acute abnormality. Musculoskeletal: No chest wall abnormality. No acute or significant osseous findings. Review of the MIP images confirms the above findings. IMPRESSION: Normal chest.  No significant abnormality. Electronically Signed   By: Ellery Plunk M.D.   On: 05/14/2016 00:56    EKG: Orders placed or performed during the hospital encounter of 05/13/16  . EKG 12-Lead  . EKG 12-Lead    IMPRESSION AND PLAN: 25 year old female patient with history of type 1 diabetes mellitus presented to the emergency room with weakness, nausea and sore throat.  Admitting diagnosis 1. Diabetic ketoacidosis 2. Dehydration 3. Type 1 diabetes mellitus 4. Hypertension 5. Sore throat secondary to pharyngitis Treatment plan Admit patient to stepdown unit IV  insulin drip for blood sugar control and acidosis IV fluid hydration Keep patient nothing by mouth Follow blood sugars closely Supportive care.  All the records are reviewed and case discussed with ED provider. Management plans discussed with the patient, family and they are in agreement.  CODE STATUS:FULL CODE Code Status History    Date Active Date Inactive Code Status Order ID Comments User Context   04/03/2016  6:10 AM 04/04/2016  4:09 PM Full Code [id number]  Arnaldo Natal, MD Inpatient       TOTAL CRITICAL CARE TIME TAKING CARE OF THIS PATIENT: 55 minutes.    Ihor Austin M.D on 05/14/2016 at 2:54 AM  Between 7am to 6pm - Pager - [phone]  After 6pm go to [email or web address] - password [email or web address] [facility]  [place] [facility]  Office  [phone]  CC: Primary care physician; No PCP Per Patient

## 2016-05-14 NOTE — ED Provider Notes (Signed)
I assumed care of the patient from Dr. 4 Loc 11:00 PM. Repeat laboratory data concerning for possible DKA patient is alert he received 2 L IV normal saline with no urine production. clinical exam patient markedly dehydrated very dry oral and nasal mucosa.. Patient persistently tachycardic and tachypnea. Patient discussed with Dr.Pyreddy for hospital admission for further evaluation and management.  Critical care: CRITICAL CARE Performed by: Darci Current   Total critical care time: 30 minutes  Critical care time was exclusive of separately billable procedures and treating other patients.  Critical care was necessary to treat or prevent imminent or life-threatening deterioration.  Critical care was time spent personally by me on the following activities: development of treatment plan with patient and/or surrogate as well as nursing, discussions with consultants, evaluation of patient's response to treatment, examination of patient, obtaining history from patient or surrogate, ordering and performing treatments and interventions, ordering and review of laboratory studies, ordering and review of radiographic studies, pulse oximetry and re-evaluation of patient's condition.    Darci Current, MD 05/14/16 [phone]

## 2016-05-14 NOTE — Consult Note (Signed)
[facility] Consult   Reason for Consult:  Consult for 25 year old woman with type 1 diabetes and multiple complications. Consult supposedly for depression. Referring Physician:  Verdell Carmine Patient Identification: Ashley Camacho MRN:  [id number] Principal Diagnosis: Adjustment disorder Diagnosis:   Patient Active Problem List   Diagnosis Date Noted  . Adjustment disorder with mixed anxiety and depressed mood [F43.23] 05/14/2016  . DKA (diabetic ketoacidoses) ([place]) [E13.10] 04/03/2016    Total Time spent with patient: 45 minutes  Subjective:   Ashley Camacho is a 25 y.o. female patient admitted with "I've had 3 hospitalizations this month".  HPI:  Patient interviewed. Chart reviewed. 25 year old woman with diabetes currently just transferred out of the critical care unit. This is her third time by her report in the hospital just in the last month. She says the reason she had to be hospitalized this time is that she had developed a sore throat which is her rationale for why she had not been eating well which threw her blood sugars off. Patient admits that she doesn't check her sugars as frequently as she ought to at home and that she has not been probably as vigilant as she needs to be with her insulin. Psychiatrically she says that there is some frustration between her and her husband about her sickness. Her husband seems to not understand the nature of her sickness and gets frustrated about it. Patient says her mood is been a little bit more down and tired but not consistently sad. Still has many positive thing she enjoys. Has some difficulty sleeping at night but she attributes it to her peripheral neuropathy. Appetite has been poor but she has several rationalizations for that as well. Patient absolutely denies any suicidal or homicidal thoughts at all. Denies having any hallucinations or psychotic symptoms and denies substance abuse. She is not currently receiving any psychiatric  treatment.  Medical history: Patient has type 1 diabetes. She has already started to develop subjective visual complications as well as painful peripheral neuropathy. She's had multiple hospitalizations just in the past month. All in all it sounds like her health is not doing very well.  Social history: Married. 47-year-old child. Sounds like there is only limited other family support available and her husband gets easily irritated although she denies there is any violence at home.  Substance abuse history: Denies any alcohol or drug abuse    Past Psychiatric History: Patient says she saw a counselor and was briefly on antidepressant medicine when her father died several years ago. She does not remember the medicine and does not remember whether it was helpful. No history of hospitalization. No history of suicide attempt.  Risk to Self: Is patient at risk for suicide?: No Risk to Others:   Prior Inpatient Therapy:   Prior Outpatient Therapy:    Past Medical History:  Past Medical History:  Diagnosis Date  . Diabetes mellitus without complication ([place])   . Hypertension   . Type 1 diabetes [facility])     Past Surgical History:  Procedure Laterality Date  . CESAREAN SECTION    . MOUTH SURGERY     Family History:  Family History  Problem Relation Age of Onset  . Diabetes type I Father   . CAD Father   . CAD Paternal Grandmother   . CAD Paternal Grandfather    Family Psychiatric  History: She is not aware of a family history of mental illness Social History:  History  Alcohol Use No  History  Drug Use No    Social History   Social History  . Marital status: Single    Spouse name: N/A  . Number of children: N/A  . Years of education: N/A   Occupational History  . home maker    Social History Main Topics  . Smoking status: Never Smoker  . Smokeless tobacco: Never Used  . Alcohol use No  . Drug use: No  . Sexual activity: Not Asked   Other Topics Concern  . None    Social History Narrative  . None   Additional Social History:    Allergies:  No Known Allergies  Labs:  Results for orders placed or performed during the hospital encounter of 05/13/16 (from the past 48 hour(s))  Glucose, capillary     Status: Abnormal   Collection Time: 05/13/16  6:06 PM  Result Value Ref Range   Glucose-Capillary 204 (H) 65 - 99 mg/dL  Basic metabolic panel     Status: Abnormal   Collection Time: 05/13/16  6:12 PM  Result Value Ref Range   Sodium 136 135 - 145 mmol/L   Potassium 4.5 3.5 - 5.1 mmol/L   Chloride 98 (L) 101 - 111 mmol/L   CO2 22 22 - 32 mmol/L   Glucose, Bld 211 (H) 65 - 99 mg/dL   BUN 22 (H) 6 - 20 mg/dL   Creatinine, Ser 0.95 0.44 - 1.00 mg/dL   Calcium 9.9 8.9 - 10.3 mg/dL   GFR calc non Af Amer >60 >60 mL/min   GFR calc Af Amer >60 >60 mL/min    Comment: (NOTE) The eGFR has been calculated using the CKD EPI equation. This calculation has not been validated in all clinical situations. eGFR's persistently <60 mL/min signify possible Chronic Kidney Disease.    Anion gap 16 (H) 5 - 15  CBC     Status: None   Collection Time: 05/13/16  6:12 PM  Result Value Ref Range   WBC 7.9 3.6 - 11.0 K/uL   RBC 4.36 3.80 - 5.20 MIL/uL   Hemoglobin 13.0 12.0 - 16.0 g/dL   HCT 38.0 35.0 - 47.0 %   MCV 87.3 80.0 - 100.0 fL   MCH 29.9 26.0 - 34.0 pg   MCHC 34.2 32.0 - 36.0 g/dL   RDW 14.2 11.5 - 14.5 %   Platelets 215 150 - 440 K/uL  Troponin I     Status: Abnormal   Collection Time: 05/13/16  6:12 PM  Result Value Ref Range   Troponin I 0.03 (HH) <0.03 ng/mL    Comment: CRITICAL RESULT CALLED TO, READ BACK BY AND VERIFIED WITH JENNA ELLINGTON RN AT 2250 05/13/16 MSS.   Fibrin derivatives D-Dimer ([facility] only)     Status: Abnormal   Collection Time: 05/13/16  6:12 PM  Result Value Ref Range   Fibrin derivatives D-dimer ([facility]) 824 (H) 0 - 499    Comment: <> Exclusion of Venous Thromboembolism (VTE) - OUTPATIENTS ONLY        (Emergency  Department or [place])             0-499 ng/ml (FEU)  : With a low to intermediate pretest                                        probability for VTE this test result  excludes the diagnosis of VTE.           > 499 ng/ml (FEU)  : VTE not excluded.  Additional work up                                   for VTE is required.   <>  Testing on Inpatients and Evaluation of Disseminated Intravascular        Coagulation ([facility])             Reference Range:   0-499 ng/ml (FEU)   Blood gas, arterial ([facility] & [facility] ONLY)     Status: Abnormal (Preliminary result)   Collection Time: 05/13/16  8:23 PM  Result Value Ref Range   FIO2 0.21    Delivery systems PENDING    Inspiratory PAP PENDING    Expiratory PAP PENDING    pH, Arterial 7.31 (L) 7.350 - 7.450   pCO2 arterial 24 (L) 32.0 - 48.0 mmHg   pO2, Arterial 100 83.0 - 108.0 mmHg   Bicarbonate 12.1 (L) 20.0 - 28.0 mmol/L   Acid-base deficit 12.6 (H) 0.0 - 2.0 mmol/L   O2 Saturation 97.1 %   Patient temperature 37.0    Oxygen index PENDING    Collection site RIGHT RADIAL    Sample type ARTERIAL DRAW    Allens test (pass/fail) PENDING PASS  POCT rapid strep A [facility])     Status: None   Collection Time: 05/13/16  9:03 PM  Result Value Ref Range   Streptococcus, Group A Screen (Direct) NEGATIVE NEGATIVE  Glucose, capillary     Status: Abnormal   Collection Time: 05/13/16 10:32 PM  Result Value Ref Range   Glucose-Capillary 280 (H) 65 - 99 mg/dL   Comment 1 Document in Chart   Urinalysis, Complete w Microscopic     Status: Abnormal   Collection Time: 05/13/16 11:13 PM  Result Value Ref Range   Color, Urine YELLOW (A) YELLOW   APPearance CLEAR (A) CLEAR   Specific Gravity, Urine 1.024 1.005 - 1.030   pH 5.0 5.0 - 8.0   Glucose, UA >=500 (A) NEGATIVE mg/dL   Hgb urine dipstick NEGATIVE NEGATIVE   Bilirubin Urine NEGATIVE NEGATIVE   Ketones, ur 80 (A) NEGATIVE mg/dL   Protein, ur 30 (A) NEGATIVE  mg/dL   Nitrite NEGATIVE NEGATIVE   Leukocytes, UA TRACE (A) NEGATIVE   RBC / HPF 0-5 0 - 5 RBC/hpf   WBC, UA 6-30 0 - 5 WBC/hpf   Bacteria, UA NONE SEEN NONE SEEN   Squamous Epithelial / LPF 0-5 (A) NONE SEEN   Mucous PRESENT    Hyaline Casts, UA PRESENT   Urine Drug Screen, Qualitative ([facility] only)     Status: Abnormal   Collection Time: 05/13/16 11:13 PM  Result Value Ref Range   Tricyclic, Ur Screen POSITIVE (A) NONE DETECTED   Amphetamines, Ur Screen NONE DETECTED NONE DETECTED   MDMA (Ecstasy)Ur Screen NONE DETECTED NONE DETECTED   Cocaine Metabolite,Ur [place] NONE DETECTED NONE DETECTED   Opiate, Ur Screen POSITIVE (A) NONE DETECTED   Phencyclidine (PCP) Ur S NONE DETECTED NONE DETECTED   Cannabinoid 50 Ng, Ur  NONE DETECTED NONE DETECTED   Barbiturates, Ur Screen NONE DETECTED NONE DETECTED   Benzodiazepine, Ur Scrn NONE DETECTED NONE DETECTED   Methadone Scn, Ur NONE DETECTED NONE DETECTED    Comment: (NOTE) 891  Tricyclics, urine  Cutoff 1000 ng/mL 200  Amphetamines, urine             Cutoff 1000 ng/mL 300  MDMA (Ecstasy), urine           Cutoff 500 ng/mL 400  Cocaine Metabolite, urine       Cutoff 300 ng/mL 500  Opiate, urine                   Cutoff 300 ng/mL 600  Phencyclidine (PCP), urine      Cutoff 25 ng/mL 700  Cannabinoid, urine              Cutoff 50 ng/mL 800  Barbiturates, urine             Cutoff 200 ng/mL 900  Benzodiazepine, urine           Cutoff 200 ng/mL 1000 Methadone, urine                Cutoff 300 ng/mL 1100 1200 The urine drug screen provides only a preliminary, unconfirmed 1300 analytical test result and should not be used for non-medical 1400 purposes. Clinical consideration and professional judgment should 1500 be applied to any positive drug screen result due to possible 1600 interfering substances. A more specific alternate chemical method 1700 must be used in order to obtain a confirmed analytical result.  1800 Gas  chromato graphy / mass spectrometry (GC/MS) is the preferred 1900 confirmatory method.   Basic metabolic panel     Status: Abnormal   Collection Time: 05/13/16 11:13 PM  Result Value Ref Range   Sodium 137 135 - 145 mmol/L   Potassium 4.2 3.5 - 5.1 mmol/L   Chloride 108 101 - 111 mmol/L   CO2 13 (L) 22 - 32 mmol/L   Glucose, Bld 334 (H) 65 - 99 mg/dL   BUN 19 6 - 20 mg/dL   Creatinine, Ser 0.91 0.44 - 1.00 mg/dL   Calcium 8.0 (L) 8.9 - 10.3 mg/dL   GFR calc non Af Amer >60 >60 mL/min   GFR calc Af Amer >60 >60 mL/min    Comment: (NOTE) The eGFR has been calculated using the CKD EPI equation. This calculation has not been validated in all clinical situations. eGFR's persistently <60 mL/min signify possible Chronic Kidney Disease.    Anion gap 16 (H) 5 - 15  TSH     Status: None   Collection Time: 05/13/16 11:13 PM  Result Value Ref Range   TSH 2.140 0.350 - 4.500 uIU/mL    Comment: Performed by a 3rd Generation assay with a functional sensitivity of <=0.01 uIU/mL.  Pregnancy, urine POC     Status: None   Collection Time: 05/13/16 11:36 PM  Result Value Ref Range   Preg Test, Ur NEGATIVE NEGATIVE    Comment:        THE SENSITIVITY OF THIS METHODOLOGY IS >24 mIU/mL   Glucose, capillary     Status: Abnormal   Collection Time: 05/14/16  2:36 AM  Result Value Ref Range   Glucose-Capillary 362 (H) 65 - 99 mg/dL  Glucose, capillary     Status: Abnormal   Collection Time: 05/14/16  4:00 AM  Result Value Ref Range   Glucose-Capillary 291 (H) 65 - 99 mg/dL  Glucose, capillary     Status: Abnormal   Collection Time: 05/14/16  5:07 AM  Result Value Ref Range   Glucose-Capillary 208 (H) 65 - 99 mg/dL  MRSA PCR Screening     Status: None  Collection Time: 05/14/16  5:33 AM  Result Value Ref Range   MRSA by PCR NEGATIVE NEGATIVE    Comment:        The GeneXpert MRSA Assay (FDA approved for NASAL specimens only), is one component of a comprehensive MRSA  colonization surveillance program. It is not intended to diagnose MRSA infection nor to guide or monitor treatment for MRSA infections.   Glucose, capillary     Status: Abnormal   Collection Time: 05/14/16  6:08 AM  Result Value Ref Range   Glucose-Capillary 153 (H) 65 - 99 mg/dL  Basic metabolic panel     Status: Abnormal   Collection Time: 05/14/16  6:36 AM  Result Value Ref Range   Sodium 141 135 - 145 mmol/L   Potassium 4.5 3.5 - 5.1 mmol/L   Chloride 118 (H) 101 - 111 mmol/L   CO2 14 (L) 22 - 32 mmol/L   Glucose, Bld 143 (H) 65 - 99 mg/dL   BUN 13 6 - 20 mg/dL   Creatinine, Ser 0.75 0.44 - 1.00 mg/dL   Calcium 8.1 (L) 8.9 - 10.3 mg/dL   GFR calc non Af Amer >60 >60 mL/min   GFR calc Af Amer >60 >60 mL/min    Comment: (NOTE) The eGFR has been calculated using the CKD EPI equation. This calculation has not been validated in all clinical situations. eGFR's persistently <60 mL/min signify possible Chronic Kidney Disease.    Anion gap 9 5 - 15  CBC     Status: Abnormal   Collection Time: 05/14/16  6:36 AM  Result Value Ref Range   WBC 7.8 3.6 - 11.0 K/uL   RBC 3.37 (L) 3.80 - 5.20 MIL/uL   Hemoglobin 9.8 (L) 12.0 - 16.0 g/dL    Comment: RESULT REPEATED AND VERIFIED   HCT 29.3 (L) 35.0 - 47.0 %   MCV 87.0 80.0 - 100.0 fL   MCH 29.1 26.0 - 34.0 pg   MCHC 33.4 32.0 - 36.0 g/dL   RDW 14.1 11.5 - 14.5 %   Platelets 138 (L) 150 - 440 K/uL    Comment: COUNT MAY BE INACCURATE DUE TO FIBRIN CLUMPS.  Glucose, capillary     Status: Abnormal   Collection Time: 05/14/16  7:11 AM  Result Value Ref Range   Glucose-Capillary 120 (H) 65 - 99 mg/dL  Glucose, capillary     Status: Abnormal   Collection Time: 05/14/16  8:14 AM  Result Value Ref Range   Glucose-Capillary 109 (H) 65 - 99 mg/dL  Glucose, capillary     Status: Abnormal   Collection Time: 05/14/16  9:27 AM  Result Value Ref Range   Glucose-Capillary 131 (H) 65 - 99 mg/dL  Basic metabolic panel     Status: Abnormal    Collection Time: 05/14/16  9:48 AM  Result Value Ref Range   Sodium 139 135 - 145 mmol/L   Potassium 3.3 (L) 3.5 - 5.1 mmol/L   Chloride 115 (H) 101 - 111 mmol/L   CO2 16 (L) 22 - 32 mmol/L   Glucose, Bld 126 (H) 65 - 99 mg/dL   BUN 11 6 - 20 mg/dL   Creatinine, Ser 0.54 0.44 - 1.00 mg/dL   Calcium 8.0 (L) 8.9 - 10.3 mg/dL   GFR calc non Af Amer >60 >60 mL/min   GFR calc Af Amer >60 >60 mL/min    Comment: (NOTE) The eGFR has been calculated using the CKD EPI equation. This calculation has not been validated in  all clinical situations. eGFR's persistently <60 mL/min signify possible Chronic Kidney Disease.    Anion gap 8 5 - 15  Glucose, capillary     Status: Abnormal   Collection Time: 05/14/16 10:13 AM  Result Value Ref Range   Glucose-Capillary 100 (H) 65 - 99 mg/dL  Glucose, capillary     Status: Abnormal   Collection Time: 05/14/16 12:29 PM  Result Value Ref Range   Glucose-Capillary 206 (H) 65 - 99 mg/dL  Glucose, capillary     Status: Abnormal   Collection Time: 05/14/16  1:47 PM  Result Value Ref Range   Glucose-Capillary 203 (H) 65 - 99 mg/dL  Basic metabolic panel     Status: Abnormal   Collection Time: 05/14/16  1:49 PM  Result Value Ref Range   Sodium 136 135 - 145 mmol/L   Potassium 3.8 3.5 - 5.1 mmol/L   Chloride 112 (H) 101 - 111 mmol/L   CO2 18 (L) 22 - 32 mmol/L   Glucose, Bld 209 (H) 65 - 99 mg/dL   BUN 10 6 - 20 mg/dL   Creatinine, Ser 0.52 0.44 - 1.00 mg/dL   Calcium 7.9 (L) 8.9 - 10.3 mg/dL   GFR calc non Af Amer >60 >60 mL/min   GFR calc Af Amer >60 >60 mL/min    Comment: (NOTE) The eGFR has been calculated using the CKD EPI equation. This calculation has not been validated in all clinical situations. eGFR's persistently <60 mL/min signify possible Chronic Kidney Disease.    Anion gap 6 5 - 15  Glucose, capillary     Status: Abnormal   Collection Time: 05/14/16  3:14 PM  Result Value Ref Range   Glucose-Capillary 233 (H) 65 - 99 mg/dL   Glucose, capillary     Status: Abnormal   Collection Time: 05/14/16  4:56 PM  Result Value Ref Range   Glucose-Capillary 151 (H) 65 - 99 mg/dL  Basic metabolic panel     Status: Abnormal   Collection Time: 05/14/16  6:05 PM  Result Value Ref Range   Sodium 139 135 - 145 mmol/L   Potassium 4.6 3.5 - 5.1 mmol/L   Chloride 115 (H) 101 - 111 mmol/L   CO2 18 (L) 22 - 32 mmol/L   Glucose, Bld 90 65 - 99 mg/dL   BUN 8 6 - 20 mg/dL   Creatinine, Ser 0.43 (L) 0.44 - 1.00 mg/dL   Calcium 7.9 (L) 8.9 - 10.3 mg/dL   GFR calc non Af Amer >60 >60 mL/min   GFR calc Af Amer >60 >60 mL/min    Comment: (NOTE) The eGFR has been calculated using the CKD EPI equation. This calculation has not been validated in all clinical situations. eGFR's persistently <60 mL/min signify possible Chronic Kidney Disease.    Anion gap 6 5 - 15    Current Facility-Administered Medications  Medication Dose Route Frequency Provider Last Rate Last Dose  . 0.9 %  sodium chloride infusion   Intravenous Continuous Saundra Shelling, MD 150 mL/hr at 05/14/16 1645    . acetaminophen (TYLENOL) tablet 650 mg  650 mg Oral Q4H PRN Henreitta Leber, MD      . dextrose 5 %-0.45 % sodium chloride infusion   Intravenous Continuous Saundra Shelling, MD   Stopped at 05/14/16 1650  . enoxaparin (LOVENOX) injection 40 mg  40 mg Subcutaneous Q24H Pavan Pyreddy, MD      . gabapentin (NEURONTIN) capsule 300 mg  300 mg Oral TID Henreitta Leber,  MD   300 mg at 05/14/16 1715  . insulin aspart (novoLOG) injection 0-5 Units  0-5 Units Subcutaneous QHS Henreitta Leber, MD      . insulin aspart (novoLOG) injection 0-9 Units  0-9 Units Subcutaneous TID WC Henreitta Leber, MD   2 Units at 05/14/16 1716  . insulin glargine (LANTUS) injection 11 Units  11 Units Subcutaneous Daily Henreitta Leber, MD   11 Units at 05/14/16 1232  . insulin regular (NOVOLIN R,HUMULIN R) 250 Units in sodium chloride 0.9 % 250 mL (1 Units/mL) infusion   Intravenous Continuous  Saundra Shelling, MD   Stopped at 05/14/16 1510  . iopamidol (ISOVUE-370) 76 % injection 75 mL  75 mL Intravenous Once PRN Gregor Hams, MD      . menthol-cetylpyridinium (CEPACOL) lozenge 3 mg  1 lozenge Oral PRN Saundra Shelling, MD   3 mg at 05/14/16 0404  . metoCLOPramide (REGLAN) tablet 5 mg  5 mg Oral TID AC Henreitta Leber, MD   5 mg at 05/14/16 1715  . nystatin (MYCOSTATIN) 100000 UNIT/ML suspension 500,000 Units  5 mL Mouth/Throat QID Henreitta Leber, MD   500,000 Units at 05/14/16 1715    Musculoskeletal: Strength & Muscle Tone: decreased Gait & Station: normal Patient leans: N/A  Psychiatric Specialty Exam: Physical Exam  Nursing note and vitals reviewed. Constitutional: She appears well-developed and well-nourished.  HENT:  Head: Normocephalic and atraumatic.  Eyes: Conjunctivae are normal. Pupils are equal, round, and reactive to light.  Neck: Normal range of motion.  Cardiovascular: Regular rhythm and normal heart sounds.   Respiratory: Effort normal. No respiratory distress.  GI: Soft.  Musculoskeletal: Normal range of motion.  Neurological: She is alert.  Skin: Skin is warm and dry.  Psychiatric: Her speech is normal. Judgment normal. Her affect is blunt. She is slowed. Thought content is not paranoid and not delusional. Cognition and memory are normal. She expresses no homicidal and no suicidal ideation.    Review of Systems  Constitutional: Positive for malaise/fatigue.  HENT: Negative.   Eyes: Positive for blurred vision.  Respiratory: Negative.   Cardiovascular: Negative.   Gastrointestinal: Negative.   Musculoskeletal: Negative.   Skin: Negative.   Neurological: Positive for tingling.  Psychiatric/Behavioral: Negative for depression, hallucinations, memory loss, substance abuse and suicidal ideas. The patient is nervous/anxious and has insomnia.     Blood pressure 98/62, pulse (!) 109, temperature 98.1 F (36.7 C), temperature source Oral, resp. rate 15,  height 5' 1"  (1.549 m), weight 47.2 kg (104 lb), SpO2 100 %.Body mass index is 19.65 kg/m.  General Appearance: Fairly Groomed  Eye Contact:  Good  Speech:  Slow  Volume:  Decreased  Mood:  Dysphoric  Affect:  Congruent  Thought Process:  Goal Directed  Orientation:  Full (Time, Place, and Person)  Thought Content:  Logical  Suicidal Thoughts:  No  Homicidal Thoughts:  No  Memory:  Immediate;   Good Recent;   Good Remote;   Good  Judgement:  Fair  Insight:  Fair  Psychomotor Activity:  Decreased  Concentration:  Concentration: Fair  Recall:  [place] of Knowledge:  Fair  Language:  Fair  Akathisia:  No  Handed:  Right  AIMS (if indicated):     Assets:  Communication Skills Desire for Improvement Financial Resources/Insurance Housing Social Support  ADL's:  Intact  Cognition:  WNL  Sleep:        Treatment Plan Summary: Plan This is a 25 year old woman  with diabetes whose health apparently is not very good recently. Also it sounds like there is some significant psychosocial stress at home. Nevertheless the patient herself does not think she is depressed and she doesn't meet criteria for major depression. I would not go so far as to suggest any medication. I did spend some time doing some psychoeducation about depression and good self-care. I've encouraged her to follow-up with her diabetes doctor as well as her primary care doctor and I have also told her to consider going to [place] to see a therapist. Patient is agreeable to the plan. I understand she is likely to be discharged tomorrow. No prescriptions at this time.  Disposition: Patient does not meet criteria for psychiatric inpatient admission. Supportive therapy provided about ongoing stressors.  Alethia Berthold, MD 05/14/2016 7:02 PM

## 2016-05-14 NOTE — Progress Notes (Signed)
[employer] [facility]   PATIENT NAME: Ashley FeltMirisha Camacho    MR#:  [id number]  DATE OF BIRTH:  11-Jun-1990  SUBJECTIVE:   Patient is here due to acute diabetic ketoacidosis and also complaining of sore throat. Anion gap closed this morning, blood sugars improved. Still complaining of some pain in swallowing.  REVIEW OF SYSTEMS:    Review of Systems  Constitutional: Negative for chills and fever.  HENT: Positive for sore throat. Negative for congestion and tinnitus.   Eyes: Negative for blurred vision and double vision.  Respiratory: Negative for cough, shortness of breath and wheezing.   Cardiovascular: Negative for chest pain, orthopnea and PND.  Gastrointestinal: Negative for abdominal pain, diarrhea, nausea and vomiting.  Genitourinary: Negative for dysuria and hematuria.  Neurological: Negative for dizziness, sensory change and focal weakness.  All other systems reviewed and are negative.   Nutrition: carb control Tolerating Diet: Yes but little Tolerating PT: Ambulatory  DRUG ALLERGIES:  No Known Allergies  VITALS:  Blood pressure 111/72, pulse (!) 117, temperature 98.7 F (37.1 C), temperature source Oral, resp. rate (!) 23, height 5\' 1"  (1.549 m), weight 47.2 kg (104 lb), SpO2 100 %.  PHYSICAL EXAMINATION:   Physical Exam  GENERAL:  25 y.o.-year-old patient lying in the bed with flat affect in NAD.  EYES: Pupils equal, round, reactive to light and accommodation. No scleral icterus. Extraocular muscles intact.  HEENT: Head atraumatic, normocephalic. Oropharynx and nasopharynx clear.  NECK:  Supple, no jugular venous distention. No thyroid enlargement, no tenderness.  LUNGS: Normal breath sounds bilaterally, no wheezing, rales, rhonchi. No use of accessory muscles of respiration.  CARDIOVASCULAR: S1, S2 normal. No murmurs, rubs, or gallops.  ABDOMEN: Soft, nontender, nondistended. Bowel sounds present. No organomegaly or mass.  EXTREMITIES:  No cyanosis, clubbing or edema b/l.    NEUROLOGIC: Cranial nerves II through XII are intact. No focal Motor or sensory deficits b/l.   PSYCHIATRIC: The patient is alert and oriented x 3. Flat affect SKIN: No obvious rash, lesion, or ulcer.    LABORATORY PANEL:   CBC  Recent Labs Lab 05/14/16 0636  WBC 7.8  HGB 9.8*  HCT 29.3*  PLT 138*   ------------------------------------------------------------------------------------------------------------------  Chemistries   Recent Labs Lab 05/14/16 1349  NA 136  K 3.8  CL 112*  CO2 18*  GLUCOSE 209*  BUN 10  CREATININE 0.52  CALCIUM 7.9*   ------------------------------------------------------------------------------------------------------------------  Cardiac Enzymes  Recent Labs Lab 05/13/16 1812  TROPONINI 0.03*   ------------------------------------------------------------------------------------------------------------------  RADIOLOGY:  Dg Neck Soft Tissue  Result Date: 05/13/2016 CLINICAL DATA:  Sore throat and chest pain for 2 days. Hyperglycemia. EXAM: NECK SOFT TISSUES - 1+ VIEW COMPARISON:  None. FINDINGS: There is no evidence of retropharyngeal soft tissue swelling or epiglottic enlargement. The cervical airway is unremarkable and no radio-opaque foreign body identified. IMPRESSION: Negative. Electronically Signed   By: Ellery Plunkaniel R Mitchell M.D.   On: 05/13/2016 20:56   Dg Chest 2 View  Result Date: 05/13/2016 CLINICAL DATA:  Acute onset chest pain and weakness EXAM: CHEST  2 VIEW COMPARISON:  None. FINDINGS: The heart size and mediastinal contours are within normal limits. Both lungs are clear. The visualized skeletal structures are unremarkable. IMPRESSION: No active cardiopulmonary disease. Electronically Signed   By: Ellery Plunkaniel R Mitchell M.D.   On: 05/13/2016 21:25   Ct Angio Chest Pe W And/or Wo Contrast  Result Date: 05/14/2016 CLINICAL DATA:  Sore throat and chest pain for several days. Hyperglycemia.  EXAM:  CT ANGIOGRAPHY CHEST WITH CONTRAST TECHNIQUE: Multidetector CT imaging of the chest was performed using the standard protocol during bolus administration of intravenous contrast. Multiplanar CT image reconstructions and MIPs were obtained to evaluate the vascular anatomy. CONTRAST:  75 mL Isovue 370 intravenous COMPARISON:  Radiographs 12217 FINDINGS: Cardiovascular: Satisfactory opacification of the pulmonary arteries to the segmental level. No evidence of pulmonary embolism. Normal heart size. No pericardial effusion. Mediastinum/Nodes: No enlarged mediastinal, hilar, or axillary lymph nodes. Thyroid gland, trachea, and esophagus demonstrate no significant findings. Lungs/Pleura: Lungs are clear. No pleural effusion or pneumothorax. Upper Abdomen: No acute abnormality. Musculoskeletal: No chest wall abnormality. No acute or significant osseous findings. Review of the MIP images confirms the above findings. IMPRESSION: Normal chest.  No significant abnormality. Electronically Signed   By: Ellery Plunk M.D.   On: 05/14/2016 00:56     ASSESSMENT AND PLAN:   25 year old female with past medical history of type 1 diabetes, essential hypertension, neuropathy, diabetic gastroparesis came into the hospital due to sore throat and noted to be in acute diabetic ketoacidosis.  1. Acute DKA-secondary to medical noncompliance. -Improved with IV fluids, IV insulin drip. Weaned off the insulin drip now and Anion gap closed - cont. Lantus, Novolog with meals, SSI and follow BS - Follows w/ Dr. Tedd Sias.  - appreciate Diabetes Coordinator Input.   2. Hx of Diabetic Gastroparesis - cont. Reglan  3. Diabetic Neuropathy - cont Neurontin  4. Anemia - Hg. Dropped from 12.3 - 9.7 since past month.  - will order Iron studies and monitor.     All the records are reviewed and case discussed with Care Management/Social Worker. Management plans discussed with the patient, family and they are in  agreement.  CODE STATUS: Full   DVT Prophylaxis: Lovenox  TOTAL TIME TAKING CARE OF THIS PATIENT: 30 minutes.   POSSIBLE D/C IN 1-2 DAYS, DEPENDING ON CLINICAL CONDITION.   Houston Siren M.D on 05/14/2016 at 3:06 PM  Between 7am to 6pm - Pager - [phone]  After 6pm go to [email or web address] - Scientist, research (life sciences) [facility]  Office  [phone]  CC: Primary care physician; No PCP Per Patient

## 2016-05-14 NOTE — Progress Notes (Signed)
Pt has remained alert and oriented with c/o sharp, upper-mid sternal CP 8-6/10. Dr Cherlynn Kaiser made aware with no new orders. Pt seems to be depressed, but has been very open regarding her situation at home. Pt reports vision disturbance r/t her unmanaged DM. Pt reports feeling that she cannot care for her daughter on some days, although she lives with boyfriend (daughters father), she reports that he expresses that he should not be the only caregiver for their daughter (d/t cultural differences per pt). Pt reports that the father/bf does not understand the importance of her DM and does not go out of his way to take her to the doctors appts or go get her insulin as pt does not drive.  Pt has been placed on [facility] prophylactically for tachycardia and CP. Lung sounds clear to auscultation. RR even and unlabored. ST on cardiac monitor in 120s.

## 2016-05-14 NOTE — ED Notes (Signed)
Called pharmacy to request medication 

## 2016-05-14 NOTE — ED Notes (Signed)
Patient transported to CT 

## 2016-05-14 NOTE — Care Management Note (Signed)
Case Management Note  Patient Details  Name: Ashley Camacho MRN: [id number] Date of Birth: [birth year]  Subjective/Objective:                  Met with patient after it was brought to my attention that patient was starting to have some difficulty with sensation in lower extremities and decreased vision probably related to diabetes. She also claims that her boyfriend is not very supportive with obtaining her medication from [facility] and claims that he says he "has to take care of two kids"- she relates to herself and the child they share. Patient does not drive and does not have driver's license.  She is followed by endocrinologist Dr. Lavone Orn whom she depends on her mother (that lives with her own boyfriend in [place]). Her mother (per patient) is not always willing to assist patient due to her work/personal obligations. Patient lives with her 5 year daughter and the child's father in [place]. Her PCP is with [facility] but patient admits she has only been going to Dr. Gabriel Carina. She agrees to home health RN coming out to help her with diabetes education. She states that she is unhappy where she lives but denies that her boyfriend is verbally or physically abusing her or her daughter. Her 81 year old is currently with the father as he did not have to work today. He works in [employer] and has no set hours of work. She states she cannot work due to her illness and vision loss. She states she has a glucometer and knows how to use it. Per RN she has a insulin pen so she insulin is pre-drawn up. I mentioned asking Dr. Gabriel Carina for 90-day Rx so that medications can be mailed- she did not know that was an option and will ask Dr. Gabriel Carina.    Action/Plan:  I am checking to see what home health agencies cover her zip code as I believe this may be beneficial for patient and her boyfriend.   Expected Discharge Date:                  Expected Discharge Plan:     In-House Referral:   Clinical Social Work  Discharge planning Services  CM Consult  Post Acute Care Choice:  Home Health Choice offered to:  Patient  DME Arranged:    DME Agency:     HH Arranged:  RN [place] Agency:     Status of Service:  In process, will continue to follow  If discussed at Long Length of Stay Meetings, dates discussed:    Additional Comments:  Marshell Garfinkel, RN 25/22/2017, 3:03 PM

## 2016-05-14 NOTE — Progress Notes (Signed)
DM education materials given

## 2016-05-14 NOTE — Progress Notes (Signed)
Inpatient Diabetes Program Recommendations  AACE/ADA: New Consensus Statement on Inpatient Glycemic Control (2015)  Target Ranges:  Prepandial:   less than 140 mg/dL      Peak postprandial:   less than 180 mg/dL (1-2 hours)      Critically ill patients:  140 - 180 mg/dL   Lab Results  Component Value Date   GLUCAP 120 (H) 05/14/2016    Review of Glycemic Control  Results for Ashley Camacho, Ashley Camacho (MRN [id number]) as of 05/14/2016 07:33  Ref. Range 05/14/2016 06:36  CO2 Latest Ref Range: 22 - 32 mmol/L 14 (L)  Anion gap Latest Ref Range: 5 - 15  9   Results for Ashley Camacho, Ashley Camacho (MRN [id number]) as of 05/14/2016 07:33  Ref. Range 05/14/2016 02:36 05/14/2016 04:00 05/14/2016 05:07 05/14/2016 06:08 05/14/2016 07:11  Glucose-Capillary Latest Ref Range: 65 - 99 mg/dL 290 (H) 211 (H) 155 (H) 153 (H) 120 (H)    Diabetes history: Type 1 Outpatient Diabetes medications: * obtained from the patient- she had a copy of the insulin orders from Dr. Tedd Sias in her purse.  Lantus 11 units qday, Novolog 2 units per 15 grams of carbohydrate, correction scale; 180-220mg /dl= 1 unit 221-260mg /dl= 2 units 261-300mg /dl = 3 units 301-340mg /dl = 5 units 341-380mg /dl = 7 units 381-420mg /dl = 9 units 421-460mg /dl = 11 units Over 461mg /dl = 15 units  Current orders for Inpatient glycemic control: IV insulin - DKA orderset  Inpatient Diabetes Program Recommendations:   Continue IV insulin until anion gap is closed and bicarbonate is greater than 18, then give basal insulin (consider Lantus 11 units qday) continue IV insulin drip for 2 more hours, then d/c IV insulin and give correction insulin at that time.    Patient is NOT following the insulin guidelines as instructed by MD.  She is not counting carbs and is only using the correction scale and guesses on dosing if she has not checked.  She reports feeling horrible when she gets her blood sugars in a normal range and her vision "goes white when I take 15 units"  and "I've fallen twice because of it".    Patient tells me that she does not drive and depends on her boyfriend to take her to get insulin.  She tells me he does not hold insulin from her but he "wants to take our daughter from me and doesn't want me to live there anymore because he says I can't take care of her anymore".  She denies physical and verbal abuse.  She does not have a great relationship with her mother and doesn't feel like she can leave her home because there is no where else for her to go. Patient tells me she uses an 208-$[id number]$$[id number]$ and needs a prescription refill.  She has Novolog and Lantus at home.  She needs new prescriptions.  Patient will need a custom order for mealtime insulin 2 units per 15 grams of carbohydrate for meals and snacks Consider using the sensitive Novolog correction 0-9 units tid.  Strongly encouraged to take mealtime insulin as ordered before every meal and snack- this will decrease the need to take the large doses of "correction insulin" she is having to use.   Consult to dietitian made. Please reassess correction scale doses given by Dr. 037-$[id number]$$[id number]$ at discharge.  897-$[id number]$$[id number]$, RN, BA, MHA, CDE Diabetes Coordinator Inpatient Diabetes Program  [phone] (Team Pager) [phone] [facility] Office) 05/14/2016 8:37 AM

## 2016-05-14 NOTE — Progress Notes (Signed)
  RD consulted for nutrition education regarding diabetes. Pt reports she has received DM education in the past. Pt able to verbalize several sources of carbohydrates, basics of carb counting (1 carb serving = 15 grams) and basic label reading to RD prior to education.  RD provided "Carbohydrate Counting for People with Diabetes" handout from the Academy of Nutrition and Dietetics.Reviewed different food groups and their effects on blood sugar, emphasizing carbohydrate-containing foods. Provided list of carbohydrates and recommended serving sizes of common foods. Pt reports she has only been eating 1 meal per day for the past month; reinforced importance of eating something (even small snacks or meal with source of carb and protein) every 4-5 hours throughout the day. Pt did not eat much breakfast, agreeable to some cheese and crackers. Provided examples of ways to balance meals/snacks and encouraged intake of high-fiber, whole grain complex carbohydrates. Teach back method used.  Expect fair compliance. Pt very flat affect on visit, discussed with RN. Pt appears to have knowledge for carb counting, unsure as to why pt has not been following diet at home at this time  Body mass index is 19.65 kg/m.   Current diet order is Carb Controlled Diet, tolerating some po at this time. Labs and medications reviewed. No further nutrition interventions warranted at this time. RD contact information provided. If additional nutrition issues arise, please re-consult RD, will follow at a distance  [facility] MS, RD, LDN [phone] Pager  [phone] Weekend/On-Call Pager

## 2016-05-14 NOTE — Clinical Social Work Note (Signed)
Clinical Social Work Assessment  Patient Details  Name: Ashley Camacho MRN: [id number] Date of Birth: Oct 18, 1990  Date of referral:  05/14/16               Reason for consult:  Abuse/Neglect                Permission sought to share information with:    Permission granted to share information::     Name::        Agency::     Relationship::     Contact Information:     Housing/Transportation Living arrangements for the past 2 months:  Single Family Home Source of Information:  Patient Patient Interpreter Needed:  None Criminal Activity/Legal Involvement Pertinent to Current Situation/Hospitalization:  No - Comment as needed Significant Relationships:  Parents, Significant Other Lives with:  Minor Children, Significant Other Do you feel safe going back to the place where you live?  Yes Need for family participation in patient care:  Yes (Comment)  Care giving concerns:  Patient resides with her boyfriend and their 25 year old child.   Social Worker assessment / plan:  CSW received consult due to concerns about her boyfriend not getting patient's medications in a timely manner. CSW reviewed medical record and went to speak with patient. CSW introduced self and explained role and purpose of visit. Patient states that her boyfriend has never made her go without her medications and she has not run out of medications, he waits a few days to take her to pick the medicine up. Patient reports that her boyfriend is not physically or verbally abusive to her or their 25 year old. She stated that they have gotten to a point where they do not talk to each other very much. She states that he has never neglected their 25 year old and takes care of her well according to the patient. CSW provided patient with women's shelter resources and informed her that she could take her 25 year old with her to this shelter. Patient states she does not wish to do this because she knows that she cannot physically care for  their 25 year old very well because she is already losing her sight and having neuropathy. Patient states she does not have access to a phone and that her boyfriend is the only one with a cell phone. She stated he gave her his old phone after he got a new phone and she accidentally dropped it and broke it and they cannot afford another one. Patient states that she cannot go live with her mother because her mother has a boyfriend that is like her boyfriend and has stated that she cannot come there to live. CSW explained to patient that she does have options if she wishes to get out and to let us know if we could help her further. Patient verbalized understanding.   Employment status:  Disabled (Comment on whether or not currently receiving Disability) Insurance information:  Medicaid In [place] PT Recommendations:    Information / Referral to community resources:     Patient/Family's Response to care:  Patient's affect was flat and she expressed gratitude for CSW visit.   Patient/Family's Understanding of and Emotional Response to Diagnosis, Current Treatment, and Prognosis:  Patient appears indifferent when discussion is had regarding changing her living situation and denies any abuse or neglect.  Emotional Assessment Appearance:  Disheveled Attitude/Demeanor/Rapport:   (pleasant and cooperative) Affect (typically observed):  Stoic, Flat, Quiet, Pleasant Orientation:  Oriented  to Self, Oriented to Place, Oriented to  Time, Oriented to Situation Alcohol / Substance use:  Not Applicable Psych involvement (Current and /or in the community):  Yes (Comment)  Discharge Needs  Concerns to be addressed:  Care Coordination Readmission within the last 30 days:  No Current discharge risk:  None Barriers to Discharge:  No Barriers Identified   York SpanielMonica Amra Shukla, LCSW 05/14/2016, 3:53 PM

## 2016-05-15 LAB — BASIC METABOLIC PANEL
Anion gap: 4 — ABNORMAL LOW (ref 5–15)
BUN: 6 mg/dL (ref 6–20)
CHLORIDE: 113 mmol/L — AB (ref 101–111)
CO2: 19 mmol/L — AB (ref 22–32)
CREATININE: 0.37 mg/dL — AB (ref 0.44–1.00)
Calcium: 7.4 mg/dL — ABNORMAL LOW (ref 8.9–10.3)
GFR calc Af Amer: >60 mL/min (ref >60)
GFR calc non Af Amer: >60 mL/min (ref >60)
Glucose, Bld: 147 mg/dL — ABNORMAL HIGH (ref 65–99)
POTASSIUM: 3.2 mmol/L — AB (ref 3.5–5.1)
SODIUM: 136 mmol/L (ref 135–145)

## 2016-05-15 LAB — GLUCOSE, CAPILLARY
GLUCOSE-CAPILLARY: 130 mg/dL — AB (ref 65–99)
Glucose-Capillary: 108 mg/dL — ABNORMAL HIGH (ref 65–99)

## 2016-05-15 MED ORDER — POTASSIUM CHLORIDE CRYS ER 20 MEQ PO TBCR
40.0000 meq | EXTENDED_RELEASE_TABLET | Freq: Once | ORAL | Status: AC
  Administered 2016-05-15: 40 meq via ORAL
  Filled 2016-05-15: qty 2

## 2016-05-15 NOTE — Discharge Summary (Signed)
[facility] - [place] at [facility]   PATIENT NAME: Ashley Camacho    MR#:  [id number]  DATE OF BIRTH:  03-15-1991  DATE OF ADMISSION:  05/13/2016 ADMITTING PHYSICIAN: Ihor Austin, MD  DATE OF DISCHARGE: 05/15/2016 12:14 PM  PRIMARY CARE PHYSICIAN: Dr. Tedd Sias   ADMISSION DIAGNOSIS:  Sore throat [J02.9] Tachycardia [R00.0] Chest pain, unspecified type [R07.9]  DISCHARGE DIAGNOSIS:  Active Problems:   DKA (diabetic ketoacidoses) (HCC)   Adjustment disorder with mixed anxiety and depressed mood   SECONDARY DIAGNOSIS:   Past Medical History:  Diagnosis Date  . Diabetes mellitus without complication (HCC)   . Hypertension   . Type 1 diabetes (HCC)     HOSPITAL COURSE:   1. Diabetic ketoacidosis. This has resolved with IV fluid hydration and the starting of insulin drip. She was converted back over to her usual Lantus. I think noncompliance with medication could be part of the issue. She states she sometimes does not take her Lantus if she doesn't eat. I told her that she must eat a consistent amount every day and 3 meals a day. She must take her Lantus. I refilled her prescription for Lantus. 2. Cough. Hold on lisinopril at this point time. 3. Essential hypertension blood pressure on the lower side hold lisinopril 4. Diabetic neuropathy on gabapentin 5. Hypokalemia replaced orally 6. Anemia follow-up as outpatient  DISCHARGE CONDITIONS:   Satisfactory  CONSULTS OBTAINED:  Treatment Team:  Audery Amel, MD  DRUG ALLERGIES:  No Known Allergies  DISCHARGE MEDICATIONS:   Discharge Medication List as of 05/15/2016 11:03 AM    CONTINUE these medications which have NOT CHANGED   Details  acetaminophen (TYLENOL) 500 MG tablet Take 500 mg by mouth every 4 (four) hours as needed., Historical Med    amitriptyline (ELAVIL) 25 MG tablet Take 25 mg by mouth every evening., Starting Thu 04/22/2016, Historical Med    gabapentin (NEURONTIN) 300 MG capsule  Take 300 mg by mouth 3 (three) times daily., Historical Med    insulin aspart (NOVOLOG) 100 UNIT/ML injection Inject 1-15 Units into the skin 3 (three) times daily before meals. Per sliding scale/ 2 units  Per 15 grams of carbs. Extra 1-15 units per glucose, Historical Med    insulin glargine (LANTUS) 100 UNIT/ML injection Inject 11 Units into the skin daily., Historical Med    medroxyPROGESTERone (DEPO-PROVERA) 150 MG/ML injection Inject 150 mg into the muscle every 3 (three) months., Historical Med    metoCLOPramide (REGLAN) 5 MG tablet Take 1 tablet by mouth 3 (three) times daily., Starting Thu 04/22/2016, Historical Med      STOP taking these medications     ibuprofen (ADVIL,MOTRIN) 400 MG tablet      lisinopril (PRINIVIL,ZESTRIL) 5 MG tablet          DISCHARGE INSTRUCTIONS:   Follow-up with endocrinology in 1-2 weeks  If you experience worsening of your admission symptoms, develop shortness of breath, life threatening emergency, suicidal or homicidal thoughts you must seek medical attention immediately by calling 911 or calling your MD immediately  if symptoms less severe.  You Must read complete instructions/literature along with all the possible adverse reactions/side effects for all the Medicines you take and that have been prescribed to you. Take any new Medicines after you have completely understood and accept all the possible adverse reactions/side effects.   Please note  You were cared for by a hospitalist during your hospital stay. If you have any questions about your discharge medications or  the care you received while you were in the hospital after you are discharged, you can call the unit and asked to speak with the hospitalist on call if the hospitalist that took care of you is not available. Once you are discharged, your primary care physician will handle any further medical issues. Please note that NO REFILLS for any discharge medications will be authorized once you  are discharged, as it is imperative that you return to your primary care physician (or establish a relationship with a primary care physician if you do not have one) for your aftercare needs so that they can reassess your need for medications and monitor your lab values.    Today   CHIEF COMPLAINT:   Chief Complaint  Patient presents with  . Sore Throat    HISTORY OF PRESENT ILLNESS:  Ashley Camacho  is a 25 y.o. female found to be in diabetic ketoacidosis   VITAL SIGNS:  Blood pressure (!) 101/59, pulse 97, temperature 98.2 F (36.8 C), temperature source Oral, resp. rate 18, height 5\' 1"  (1.549 m), weight 47.2 kg (104 lb), SpO2 100 %.   PHYSICAL EXAMINATION:  GENERAL:  25 y.o.-year-old patient lying in the bed with no acute distress.  EYES: Pupils equal, round, reactive to light and accommodation. No scleral icterus. Extraocular muscles intact.  HEENT: Head atraumatic, normocephalic. Oropharynx and nasopharynx clear. No exudate seen in the throat NECK:  Supple, no jugular venous distention. No thyroid enlargement, no tenderness.  LUNGS: Normal breath sounds bilaterally, no wheezing, rales,rhonchi or crepitation. No use of accessory muscles of respiration.  CARDIOVASCULAR: S1, S2 normal. No murmurs, rubs, or gallops.  ABDOMEN: Soft, non-tender, non-distended. Bowel sounds present. No organomegaly or mass.  EXTREMITIES: No pedal edema, cyanosis, or clubbing.  NEUROLOGIC: Cranial nerves II through XII are intact. Muscle strength 5/5 in all extremities. Sensation intact. Gait not checked.  PSYCHIATRIC: The patient is alert and oriented x 3.  SKIN: No obvious rash, lesion, or ulcer.   DATA REVIEW:   CBC  Recent Labs Lab 05/14/16 0636  WBC 7.8  HGB 9.8*  HCT 29.3*  PLT 138*    Chemistries   Recent Labs Lab 05/15/16 0205  NA 136  K 3.2*  CL 113*  CO2 19*  GLUCOSE 147*  BUN 6  CREATININE 0.37*  CALCIUM 7.4*    Cardiac Enzymes  Recent Labs Lab 05/13/16 1812   TROPONINI 0.03*    Microbiology Results  Results for orders placed or performed during the hospital encounter of 05/13/16  MRSA PCR Screening     Status: None   Collection Time: 05/14/16  5:33 AM  Result Value Ref Range Status   MRSA by PCR NEGATIVE NEGATIVE Final    Comment:        The GeneXpert MRSA Assay (FDA approved for NASAL specimens only), is one component of a comprehensive MRSA colonization surveillance program. It is not intended to diagnose MRSA infection nor to guide or monitor treatment for MRSA infections.     RADIOLOGY:  Dg Neck Soft Tissue  Result Date: 05/13/2016 CLINICAL DATA:  Sore throat and chest pain for 2 days. Hyperglycemia. EXAM: NECK SOFT TISSUES - 1+ VIEW COMPARISON:  None. FINDINGS: There is no evidence of retropharyngeal soft tissue swelling or epiglottic enlargement. The cervical airway is unremarkable and no radio-opaque foreign body identified. IMPRESSION: Negative. Electronically Signed   By: Ellery Plunk M.D.   On: 05/13/2016 20:56   Dg Chest 2 View  Result Date: 05/13/2016 CLINICAL DATA:  Acute onset chest pain and weakness EXAM: CHEST  2 VIEW COMPARISON:  None. FINDINGS: The heart size and mediastinal contours are within normal limits. Both lungs are clear. The visualized skeletal structures are unremarkable. IMPRESSION: No active cardiopulmonary disease. Electronically Signed   By: Ellery Plunkaniel R Mitchell M.D.   On: 05/13/2016 21:25   Ct Angio Chest Pe W And/or Wo Contrast  Result Date: 05/14/2016 CLINICAL DATA:  Sore throat and chest pain for several days. Hyperglycemia. EXAM: CT ANGIOGRAPHY CHEST WITH CONTRAST TECHNIQUE: Multidetector CT imaging of the chest was performed using the standard protocol during bolus administration of intravenous contrast. Multiplanar CT image reconstructions and MIPs were obtained to evaluate the vascular anatomy. CONTRAST:  75 mL Isovue 370 intravenous COMPARISON:  Radiographs 12217 FINDINGS: Cardiovascular:  Satisfactory opacification of the pulmonary arteries to the segmental level. No evidence of pulmonary embolism. Normal heart size. No pericardial effusion. Mediastinum/Nodes: No enlarged mediastinal, hilar, or axillary lymph nodes. Thyroid gland, trachea, and esophagus demonstrate no significant findings. Lungs/Pleura: Lungs are clear. No pleural effusion or pneumothorax. Upper Abdomen: No acute abnormality. Musculoskeletal: No chest wall abnormality. No acute or significant osseous findings. Review of the MIP images confirms the above findings. IMPRESSION: Normal chest.  No significant abnormality. Electronically Signed   By: Ellery Plunkaniel R Mitchell M.D.   On: 05/14/2016 00:56      Management plans discussed with the patient, and she is in agreement.  CODE STATUS:  Code Status History    Date Active Date Inactive Code Status Order ID Comments User Context   05/14/2016  5:26 AM 05/15/2016  3:19 PM Full Code [id number]  Ihor AustinPavan Pyreddy, MD Inpatient   04/03/2016  6:10 AM 04/04/2016  4:09 PM Full Code [id number]  Arnaldo NatalMichael S Diamond, MD Inpatient      TOTAL TIME TAKING CARE OF THIS PATIENT: 35 minutes.    Alford HighlandWIETING, Tonee Silverstein M.D on 05/15/2016 at 4:45 PM  Between 7am to 6pm - Pager - [phone]  After 6pm go to [email or web address] - password [email or web address]  [facility] Office  [phone]  CC: Primary care physician; Dr. Tedd SiasSolum

## 2016-05-15 NOTE — Progress Notes (Signed)
Dc instrcutions given. Verbalizes understanding. Understands how to take meds  And to call Tuesday for an appt with MD. Dc home with family transport

## 2016-05-15 NOTE — Care Management Note (Signed)
Case Management Note  Patient Details  Name: Ashley Camacho MRN: [id number] Date of Birth: 1990-07-25  Subjective/Objective:         Discussed discharge planning with Dr Renae Gloss for HH=RN and SW. No home health orders given per [name] does not meet criteria for these services.            Action/Plan:   Expected Discharge Date:                  Expected Discharge Plan:     In-House Referral:  Clinical Social Work  Discharge planning Services  CM Consult  Post Acute Care Choice:  Home Health Choice offered to:  Patient  DME Arranged:    DME Agency:     HH Arranged:  RN HH Agency:     Status of Service:  In process, will continue to follow  If discussed at Long Length of Stay Meetings, dates discussed:    Additional Comments:  Gem Conkle A, RN 05/15/2016, 10:08 AM

## 2016-05-16 LAB — BLOOD GAS, ARTERIAL
Acid-base deficit: 12.6 mmol/L — ABNORMAL HIGH (ref 0.0–2.0)
Bicarbonate: 12.1 mmol/L — ABNORMAL LOW (ref 20.0–28.0)
FIO2: 0.21
O2 SAT: 97.1 %
PATIENT TEMPERATURE: 37
PCO2 ART: 24 mmHg — AB (ref 32.0–48.0)
PO2 ART: 100 mmHg (ref 83.0–108.0)
pH, Arterial: 7.31 — ABNORMAL LOW (ref 7.350–7.450)

## 2016-05-16 LAB — HEMOGLOBIN A1C
HEMOGLOBIN A1C: 8.7 % — AB (ref 4.8–5.6)
MEAN PLASMA GLUCOSE: 203 mg/dL

## 2017-11-28 ENCOUNTER — Other Ambulatory Visit: Payer: Self-pay

## 2017-11-28 ENCOUNTER — Inpatient Hospital Stay
Admission: EM | Admit: 2017-11-28 | Discharge: 2017-11-29 | DRG: 638 | Disposition: A | Discharge status: Home / Self Care | Payer: Medicaid Other | Attending: Specialist | Admitting: Specialist

## 2017-11-28 ENCOUNTER — Emergency Department: Payer: Medicaid Other

## 2017-11-28 ENCOUNTER — Encounter: Payer: Self-pay | Admitting: Emergency Medicine

## 2017-11-28 DIAGNOSIS — N179 Acute kidney failure, unspecified: Secondary | ICD-10-CM | POA: Diagnosis present

## 2017-11-28 DIAGNOSIS — Z9119 Patient's noncompliance with other medical treatment and regimen: Secondary | ICD-10-CM | POA: Diagnosis not present

## 2017-11-28 DIAGNOSIS — Z8249 Family history of ischemic heart disease and other diseases of the circulatory system: Secondary | ICD-10-CM | POA: Diagnosis not present

## 2017-11-28 DIAGNOSIS — I10 Essential (primary) hypertension: Secondary | ICD-10-CM | POA: Diagnosis present

## 2017-11-28 DIAGNOSIS — F329 Major depressive disorder, single episode, unspecified: Secondary | ICD-10-CM | POA: Diagnosis present

## 2017-11-28 DIAGNOSIS — E1010 Type 1 diabetes mellitus with ketoacidosis without coma: Principal | ICD-10-CM | POA: Diagnosis present

## 2017-11-28 DIAGNOSIS — Z79899 Other long term (current) drug therapy: Secondary | ICD-10-CM

## 2017-11-28 DIAGNOSIS — I208 Other forms of angina pectoris: Secondary | ICD-10-CM

## 2017-11-28 DIAGNOSIS — Z794 Long term (current) use of insulin: Secondary | ICD-10-CM

## 2017-11-28 DIAGNOSIS — Z833 Family history of diabetes mellitus: Secondary | ICD-10-CM | POA: Diagnosis not present

## 2017-11-28 DIAGNOSIS — E1110 Type 2 diabetes mellitus with ketoacidosis without coma: Secondary | ICD-10-CM | POA: Diagnosis present

## 2017-11-28 DIAGNOSIS — L539 Erythematous condition, unspecified: Secondary | ICD-10-CM | POA: Diagnosis present

## 2017-11-28 DIAGNOSIS — E0810 Diabetes mellitus due to underlying condition with ketoacidosis without coma: Secondary | ICD-10-CM

## 2017-11-28 DIAGNOSIS — E860 Dehydration: Secondary | ICD-10-CM | POA: Diagnosis present

## 2017-11-28 LAB — BASIC METABOLIC PANEL
ANION GAP: 8 (ref 5–15)
Anion gap: 14 (ref 5–15)
BUN: 25 mg/dL — AB (ref 6–20)
BUN: 19 mg/dL (ref 6–20)
CALCIUM: 9.3 mg/dL (ref 8.9–10.3)
CALCIUM: 8.3 mg/dL — AB (ref 8.9–10.3)
CHLORIDE: 99 mmol/L (ref 98–111)
CO2: 17 mmol/L — ABNORMAL LOW (ref 22–32)
CO2: 22 mmol/L (ref 22–32)
CREATININE: 1.04 mg/dL — AB (ref 0.44–1.00)
Chloride: 106 mmol/L (ref 98–111)
Creatinine, Ser: 0.67 mg/dL (ref 0.44–1.00)
Glucose, Bld: 521 mg/dL (ref 70–99)
Glucose, Bld: 158 mg/dL — ABNORMAL HIGH (ref 70–99)
POTASSIUM: 4 mmol/L (ref 3.5–5.1)
Potassium: 4.1 mmol/L (ref 3.5–5.1)
SODIUM: 130 mmol/L — AB (ref 135–145)
Sodium: 136 mmol/L (ref 135–145)

## 2017-11-28 LAB — BLOOD GAS, VENOUS
Acid-base deficit: 7.6 mmol/L — ABNORMAL HIGH (ref 0.0–2.0)
BICARBONATE: 17.4 mmol/L — AB (ref 20.0–28.0)
O2 SAT: 78.1 %
PATIENT TEMPERATURE: 37
PCO2 VEN: 33 mmHg — AB (ref 44.0–60.0)
PO2 VEN: 46 mmHg — AB (ref 32.0–45.0)
pH, Ven: 7.33 (ref 7.250–7.430)

## 2017-11-28 LAB — MRSA PCR SCREENING: MRSA by PCR: NEGATIVE

## 2017-11-28 LAB — GLUCOSE, CAPILLARY
GLUCOSE-CAPILLARY: 214 mg/dL — AB (ref 70–99)
GLUCOSE-CAPILLARY: 144 mg/dL — AB (ref 70–99)
GLUCOSE-CAPILLARY: 382 mg/dL — AB (ref 70–99)
Glucose-Capillary: 519 mg/dL (ref 70–99)
Glucose-Capillary: 420 mg/dL — ABNORMAL HIGH (ref 70–99)
Glucose-Capillary: 257 mg/dL — ABNORMAL HIGH (ref 70–99)

## 2017-11-28 LAB — URINALYSIS, COMPLETE (UACMP) WITH MICROSCOPIC
Bilirubin Urine: NEGATIVE
Glucose, UA: >=500 mg/dL — AB
HGB URINE DIPSTICK: NEGATIVE
Ketones, ur: 80 mg/dL — AB
LEUKOCYTES UA: NEGATIVE
NITRITE: NEGATIVE
Protein, ur: NEGATIVE mg/dL
SPECIFIC GRAVITY, URINE: 1.029 (ref 1.005–1.030)
pH: 5 (ref 5.0–8.0)

## 2017-11-28 LAB — CBC
HCT: 35 % (ref 35.0–47.0)
Hemoglobin: 12.1 g/dL (ref 12.0–16.0)
MCH: 30 pg (ref 26.0–34.0)
MCHC: 34.7 g/dL (ref 32.0–36.0)
MCV: 86.3 fL (ref 80.0–100.0)
PLATELETS: 279 10*3/uL (ref 150–440)
RBC: 4.05 MIL/uL (ref 3.80–5.20)
RDW: 12.9 % (ref 11.5–14.5)
WBC: 6.6 10*3/uL (ref 3.6–11.0)

## 2017-11-28 LAB — BETA-HYDROXYBUTYRIC ACID: Beta-Hydroxybutyric Acid: 1.39 mmol/L — ABNORMAL HIGH (ref 0.05–0.27)

## 2017-11-28 LAB — URINE DRUG SCREEN, QUALITATIVE (ARMC ONLY)
AMPHETAMINES, UR SCREEN: NOT DETECTED
BENZODIAZEPINE, UR SCRN: NOT DETECTED
Cannabinoid 50 Ng, Ur ~~LOC~~: NOT DETECTED
Cocaine Metabolite,Ur ~~LOC~~: NOT DETECTED
MDMA (ECSTASY) UR SCREEN: NOT DETECTED
METHADONE SCREEN, URINE: NOT DETECTED
OPIATE, UR SCREEN: NOT DETECTED
Phencyclidine (PCP) Ur S: NOT DETECTED
Tricyclic, Ur Screen: NOT DETECTED

## 2017-11-28 LAB — TROPONIN I: Troponin I: <0.03 ng/mL (ref <0.03)

## 2017-11-28 LAB — POCT PREGNANCY, URINE: PREG TEST UR: NEGATIVE

## 2017-11-28 MED ORDER — SODIUM CHLORIDE 0.9 % IV SOLN
INTRAVENOUS | Status: DC
  Administered 2017-11-28 – 2017-11-29 (×2): via INTRAVENOUS

## 2017-11-28 MED ORDER — VITAMIN D 1000 UNITS PO TABS
2000.0000 [IU] | ORAL_TABLET | Freq: Every day | ORAL | Status: DC
  Administered 2017-11-29: 2000 [IU] via ORAL
  Filled 2017-11-28: qty 2

## 2017-11-28 MED ORDER — INSULIN GLARGINE 100 UNIT/ML ~~LOC~~ SOLN
12.0000 [IU] | SUBCUTANEOUS | Status: DC
  Filled 2017-11-28: qty 0.12

## 2017-11-28 MED ORDER — SODIUM CHLORIDE 0.9 % IV BOLUS
1000.0000 mL | Freq: Once | INTRAVENOUS | Status: AC
  Administered 2017-11-28: 1000 mL via INTRAVENOUS

## 2017-11-28 MED ORDER — SODIUM CHLORIDE 0.9 % IV SOLN: INTRAVENOUS | Status: DC

## 2017-11-28 MED ORDER — INSULIN ASPART 100 UNIT/ML ~~LOC~~ SOLN
SUBCUTANEOUS | Status: AC
  Administered 2017-11-28: 6 [IU] via INTRAVENOUS
  Filled 2017-11-28: qty 1

## 2017-11-28 MED ORDER — INSULIN ASPART 100 UNIT/ML ~~LOC~~ SOLN
0.0000 [IU] | Freq: Three times a day (TID) | SUBCUTANEOUS | Status: DC
  Administered 2017-11-29: 11 [IU] via SUBCUTANEOUS
  Administered 2017-11-29: 5 [IU] via SUBCUTANEOUS
  Filled 2017-11-28 (×2): qty 1

## 2017-11-28 MED ORDER — METOCLOPRAMIDE HCL 5 MG PO TABS
5.0000 mg | ORAL_TABLET | Freq: Three times a day (TID) | ORAL | Status: DC | PRN
  Administered 2017-11-29: 5 mg via ORAL
  Filled 2017-11-28: qty 1
  Filled 2017-11-28: qty 0.5
  Filled 2017-11-28: qty 1

## 2017-11-28 MED ORDER — INSULIN ASPART 100 UNIT/ML ~~LOC~~ SOLN
6.0000 [IU] | Freq: Once | SUBCUTANEOUS | Status: AC
  Administered 2017-11-28: 6 [IU] via INTRAVENOUS

## 2017-11-28 MED ORDER — DEXTROSE-NACL 5-0.45 % IV SOLN: INTRAVENOUS | Status: DC

## 2017-11-28 MED ORDER — ENOXAPARIN SODIUM 40 MG/0.4ML ~~LOC~~ SOLN
40.0000 mg | SUBCUTANEOUS | Status: DC
  Administered 2017-11-28: 40 mg via SUBCUTANEOUS
  Filled 2017-11-28: qty 0.4

## 2017-11-28 MED ORDER — SODIUM CHLORIDE 0.9 % IV SOLN
INTRAVENOUS | Status: DC
  Filled 2017-11-28: qty 1

## 2017-11-28 MED ORDER — SODIUM CHLORIDE 0.9 % IV SOLN
INTRAVENOUS | Status: DC
  Filled 2017-11-28 (×2): qty 1

## 2017-11-28 MED ORDER — DULOXETINE HCL 30 MG PO CPEP
30.0000 mg | ORAL_CAPSULE | Freq: Two times a day (BID) | ORAL | Status: DC
  Administered 2017-11-28 – 2017-11-29 (×2): 30 mg via ORAL
  Filled 2017-11-28 (×2): qty 1

## 2017-11-28 NOTE — ED Notes (Signed)
Spoke with MD Alphonzo Lemmings about pt symptoms, see MAR for verbal orders given

## 2017-11-28 NOTE — ED Notes (Signed)
Hospitalist to bedside at this time 

## 2017-11-28 NOTE — Progress Notes (Signed)
Per Dr. Belia Heman, do not start insulin drip at this time, as pt's FSBS upon arrival to ICU down to 214. To await results of BMP, if CO2 has corrected, do not start insulin drip, and patient may be moved to floor. NS currently running at 21ml/hr. Once BMP has resulted, will speak with Dr. Belia Heman regarding further orders.

## 2017-11-28 NOTE — Progress Notes (Signed)
Pharmacy Electrolyte Monitoring Consult:  Pharmacy consulted to assist in monitoring and replacing electrolytes in this 27 y.o. female admitted on 11/28/2017 with Hyperglycemia and Chest Pain   Labs:  Sodium (mmol/L)  Date Value  11/28/2017 136  03/01/2013 133 (L)   Potassium (mmol/L)  Date Value  11/28/2017 4.0  03/01/2013 4.1   Calcium (mg/dL)  Date Value  03/83/3383 8.3 (L)   Calcium, Total (mg/dL)  Date Value  29/19/1660 9.1   Albumin (g/dL)  Date Value  60/08/5995 4.4  03/01/2013 3.9    Assessment/Plan:  Patient initially admitted to ICU for DKA. Patient is not requiring insulin drip and does not require electrolyte supplementation. Will order BMP with am labs and complete pharmacy consult.   Pharmacy will continue to monitor and adjust per consult.   Simpson,Michael L 11/28/2017 6:28 PM

## 2017-11-28 NOTE — H&P (Signed)
[facility] - [place] at [facility]   PATIENT NAME: Ashley Camacho    MR#:  [id number]  DATE OF BIRTH:  11/23/1990  DATE OF ADMISSION:  11/28/2017  PRIMARY CARE PHYSICIAN: [facility]   REQUESTING/REFERRING PHYSICIAN: Arnaldo Natal, MD  CHIEF COMPLAINT:   Chief Complaint  Patient presents with  . Hyperglycemia  . Chest Pain    HISTORY OF PRESENT ILLNESS:  Ashley Camacho  is a 27 y.o. female with a known history of DM being admitted for DKA. She woke up this morning with high blood sugar.  She reports she took 14 units of NovoLog at about noon.  She also complaints of chest pain.  The pain is worse with exertion gets better when she rests.  She has had this coming and going for quite some time.  She says she is been told it was due to her high blood sugars. Here in the emergency room the blood sugar is 519.  PAST MEDICAL HISTORY:   Past Medical History:  Diagnosis Date  . Diabetes mellitus without complication (HCC)   . Hypertension   . Type 1 diabetes (HCC)     PAST SURGICAL HISTORY:   Past Surgical History:  Procedure Laterality Date  . CESAREAN SECTION    . MOUTH SURGERY      SOCIAL HISTORY:   Social History   Tobacco Use  . Smoking status: Never Smoker  . Smokeless tobacco: Never Used  Substance Use Topics  . Alcohol use: No    FAMILY HISTORY:   Family History  Problem Relation Age of Onset  . Diabetes type I Father   . CAD Father   . CAD Paternal Grandmother   . CAD Paternal Grandfather     DRUG ALLERGIES:  No Known Allergies  REVIEW OF SYSTEMS:   Review of Systems  Constitutional: Positive for malaise/fatigue. Negative for chills, fever and weight loss.  HENT: Negative for nosebleeds and sore throat.   Eyes: Negative for blurred vision.  Respiratory: Negative for cough, shortness of breath and wheezing.   Cardiovascular: Negative for chest pain, orthopnea, leg swelling and PND.  Gastrointestinal:  Negative for abdominal pain, constipation, diarrhea, heartburn, nausea and vomiting.  Genitourinary: Negative for dysuria and urgency.  Musculoskeletal: Negative for back pain.  Skin: Negative for rash.  Neurological: Negative for dizziness, speech change, focal weakness and headaches.  Endo/Heme/Allergies: Does not bruise/bleed easily.  Psychiatric/Behavioral: Negative for depression.   MEDICATIONS AT HOME:   Prior to Admission medications   Medication Sig Start Date End Date Taking? Authorizing Provider  D3-1000 1000 units tablet Take 2,000 Units by mouth daily. 08/30/17  Yes [provider]  DULoxetine (CYMBALTA) 30 MG capsule Take 30 mg by mouth 2 (two) times daily. 08/29/17  Yes [provider]  insulin aspart (NOVOLOG) 100 UNIT/ML injection Inject 0-12 Units into the skin 3 (three) times daily before meals.    Yes [provider]  insulin glargine (LANTUS) 100 UNIT/ML injection Inject 12 Units into the skin daily.    Yes [provider]  medroxyPROGESTERone (DEPO-PROVERA) 150 MG/ML injection Inject 150 mg into the muscle every 3 (three) months.   Yes [provider]  metoCLOPramide (REGLAN) 5 MG tablet Take 1 tablet by mouth 3 (three) times daily as needed for nausea.  04/22/16   [provider]      VITAL SIGNS:  Blood pressure 104/63, pulse (!) 114, resp. rate 16, height 5\' 1"  (1.549 m), weight 49.9  kg (110 lb), SpO2 97 %. PHYSICAL EXAMINATION:  Physical Exam  GENERAL:  27 y.o.-year-old patient lying in the bed with no acute distress.  EYES: Pupils equal, round, reactive to light and accommodation. No scleral icterus. Extraocular muscles intact.  HEENT: Head atraumatic, normocephalic. Oropharynx and nasopharynx clear.  NECK:  Supple, no jugular venous distention. No thyroid enlargement, no tenderness.  LUNGS: Normal breath sounds bilaterally, no wheezing, rales,rhonchi or crepitation. No use of accessory muscles of respiration.    CARDIOVASCULAR: S1, S2 normal. No murmurs, rubs, or gallops.  ABDOMEN: Soft, nontender, nondistended. Bowel sounds present. No organomegaly or mass.  EXTREMITIES: No pedal edema, cyanosis, or clubbing.  NEUROLOGIC: Cranial nerves II through XII are intact. Muscle strength 5/5 in all extremities. Sensation intact. Gait not checked.  PSYCHIATRIC: The patient is alert and oriented x 3.  SKIN: No obvious rash, lesion, or ulcer.   LABORATORY PANEL:   CBC Recent Labs  Lab 11/28/17 1350  WBC 6.6  HGB 12.1  HCT 35.0  PLT 279   ------------------------------------------------------------------------------------------------------------------  Chemistries  Recent Labs  Lab 11/28/17 1350  NA 130*  K 4.1  CL 99  CO2 17*  GLUCOSE 521*  BUN 25*  CREATININE 1.04*  CALCIUM 9.3   ------------------------------------------------------------------------------------------------------------------  Cardiac Enzymes Recent Labs  Lab 11/28/17 1350  TROPONINI <0.03   ------------------------------------------------------------------------------------------------------------------  RADIOLOGY:  Dg Chest 2 View  Result Date: 11/28/2017 CLINICAL DATA:  Chest pain and hyperglycemia EXAM: CHEST - 2 VIEW COMPARISON:  Chest radiograph May 13, 2016 and chest CT May 14, 2016 FINDINGS: Lungs are clear. The heart size and pulmonary vascularity are normal. No adenopathy. No pneumothorax. No bone lesions. IMPRESSION: No edema or consolidation. Electronically Signed   By: Bretta Bang III M.D.   On: 11/28/2017 14:23   IMPRESSION AND PLAN:  19 y f with DKA  * DKA - DKA protocol, Insulin drip  * ARF - likely prerenal, IVF and monitor  * Sinus tach - could be due to DKA &/dehydration - monitor with hydration and treatment of DKA while in ICU  * Depression - continue Cymbalta    All the records are reviewed and case discussed with ED provider. Management plans discussed with the  patient, family(mother at bedside) and they are in agreement.  CODE STATUS: FULL CODE  TOTAL TIME TAKING CARE OF THIS PATIENT: 45 minutes.    Delfino Lovett M.D on 11/28/2017 at 3:46 PM  Between 7am to 6pm - Pager - [phone]  After 6pm go to [email or web address] - Scientist, research (life sciences) [facility]  Office  [phone]  CC: Primary care physician; [facility] [employer]   Note: This dictation was prepared with Nurse, children's dictation along with smaller Lobbyist. Any transcriptional errors that result from this process are unintentional.

## 2017-11-28 NOTE — ED Triage Notes (Addendum)
Pt to ED via POV with c/o hyperglycemia and CP. PT type 1 DM, cbg at this time is 519. PT states she took 14U of Novolog at 99. PT states CP is chronic. HR 113. Pt appears dry, denies pain. C/o nuasea since yesterday but denies vomiting or diarrhea

## 2017-11-28 NOTE — ED Notes (Signed)
Patient transported to X-ray 

## 2017-11-28 NOTE — ED Notes (Signed)
Pharmacy notified to send Insulin drip. 

## 2017-11-28 NOTE — ED Notes (Signed)
Date and time results received: 11/28/17 1440   Test: glucose Critical Value: 521  Name of Provider Notified: Dr. Dorothea Glassman

## 2017-11-28 NOTE — ED Provider Notes (Addendum)
[facility] Emergency Department Provider Note   ____________________________________________   First MD Initiated Contact with Patient 11/28/17 1351     (approximate)  I have reviewed the triage vital signs and the nursing notes.   HISTORY  Chief Complaint Hyperglycemia and Chest Pain    HPI Ashley Camacho is a 27 y.o. female who woke up this morning with high blood sugar.  She reports she took 14 units of NovoLog at about noon.  She also read complaints of chest pain.  The pain is worse with exertion gets better when she rests.  She has had this coming and going for quite some time.  She says she is been told it was due to her high blood sugars.  She denies any abdominal pain.  She told the nurse she had some nausea yesterday but told me she did not have any until just now.  No vomiting or diarrhea.  She has no shortness of breath.  Here in the emergency room the blood sugar is 519.  Again she denies any abdominal pain, coughing or dysuria.   Past Medical History:  Diagnosis Date  . Diabetes mellitus without complication (HCC)   . Hypertension   . Type 1 diabetes [facility])     Patient Active Problem List   Diagnosis Date Noted  . Adjustment disorder with mixed anxiety and depressed mood 05/14/2016  . DKA (diabetic ketoacidoses) (HCC) 04/03/2016    Past Surgical History:  Procedure Laterality Date  . CESAREAN SECTION    . MOUTH SURGERY      Prior to Admission medications   Medication Sig Start Date End Date Taking? Authorizing Provider  D3-1000 1000 units tablet Take 2,000 Units by mouth daily. 08/30/17  Yes [provider]  DULoxetine (CYMBALTA) 30 MG capsule Take 30 mg by mouth 2 (two) times daily. 08/29/17  Yes [provider]  insulin aspart (NOVOLOG) 100 UNIT/ML injection Inject 0-12 Units into the skin 3 (three) times daily before meals.    Yes [provider]  insulin glargine (LANTUS) 100 UNIT/ML injection Inject 12  Units into the skin daily.    Yes [provider]  medroxyPROGESTERone (DEPO-PROVERA) 150 MG/ML injection Inject 150 mg into the muscle every 3 (three) months.   Yes [provider]  metoCLOPramide (REGLAN) 5 MG tablet Take 1 tablet by mouth 3 (three) times daily as needed for nausea.  04/22/16   [provider]    Allergies Patient has no known allergies.  Family History  Problem Relation Age of Onset  . Diabetes type I Father   . CAD Father   . CAD Paternal Grandmother   . CAD Paternal Grandfather     Social History Social History   Tobacco Use  . Smoking status: Never Smoker  . Smokeless tobacco: Never Used  Substance Use Topics  . Alcohol use: No  . Drug use: No    Review of Systems  Constitutional: No fever/chills Eyes: No visual changes. ENT: No sore throat. Cardiovascular: Denies chest pain. Respiratory: Denies shortness of breath. Gastrointestinal: No abdominal pain.  No nausea, no vomiting.  No diarrhea.  No constipation. Genitourinary: Negative for dysuria. Musculoskeletal: Negative for back pain. Skin: Negative for rash. Neurological: Negative for headaches, focal weakness   ____________________________________________   PHYSICAL EXAM:  VITAL SIGNS: ED Triage Vitals  Enc Vitals Group     BP 11/28/17 1332 104/63     Pulse Rate 11/28/17 1332 (!) 114     Resp  11/28/17 1332 16     Temp --      Temp src --      SpO2 11/28/17 1332 97 %     Weight 11/28/17 1340 110 lb (49.9 kg)     Height 11/28/17 1340 5\' 1"  (1.549 m)     Head Circumference --      Peak Flow --      Pain Score 11/28/17 1339 0     Pain Loc --      Pain Edu? --      Excl. in GC? --     Constitutional: Alert and oriented. Well appearing and in no acute distress.  Patient does have a slight ketotic odor to her. Eyes: Conjunctivae are normal.  Head: Atraumatic. Nose: No congestion/rhinnorhea. Mouth/Throat: Mucous membranes are moist.  Oropharynx  non-erythematous. Neck: No stridor.   Cardiovascular: Normal rate, regular rhythm. Grossly normal heart sounds.  Good peripheral circulation. Respiratory: Normal respiratory effort.  No retractions. Lungs CTAB. Gastrointestinal: Soft and nontender. No distention. No abdominal bruits. No CVA tenderness. Musculoskeletal: No lower extremity tenderness nor edema.  No joint effusions. Neurologic:  Normal speech and language. No gross focal neurologic deficits are appreciated. No gait instability. Skin:  Skin is warm, dry and intact. No rash noted. Psychiatric: Mood and affect are normal. Speech and behavior are normal.  ____________________________________________   LABS (all labs ordered are listed, but only abnormal results are displayed)  Labs Reviewed  GLUCOSE, CAPILLARY - Abnormal; Notable for the following components:      Result Value   Glucose-Capillary 519 (*)    All other components within normal limits  BASIC METABOLIC PANEL - Abnormal; Notable for the following components:   Sodium 130 (*)    CO2 17 (*)    Glucose, Bld 521 (*)    BUN 25 (*)    Creatinine, Ser 1.04 (*)    All other components within normal limits  URINALYSIS, COMPLETE (UACMP) WITH MICROSCOPIC - Abnormal; Notable for the following components:   Color, Urine YELLOW (*)    APPearance CLEAR (*)    Glucose, UA >=500 (*)    Ketones, ur 80 (*)    Bacteria, UA RARE (*)    All other components within normal limits  BLOOD GAS, VENOUS - Abnormal; Notable for the following components:   pCO2, Ven 33 (*)    pO2, Ven 46.0 (*)    Bicarbonate 17.4 (*)    Acid-base deficit 7.6 (*)    All other components within normal limits  GLUCOSE, CAPILLARY - Abnormal; Notable for the following components:   Glucose-Capillary 420 (*)    All other components within normal limits  CBC  TROPONIN I  BETA-HYDROXYBUTYRIC ACID  CBG MONITORING, ED  POC URINE PREG, ED  CBG MONITORING, ED  POCT PREGNANCY, URINE  CBG MONITORING, ED    ____________________________________________  EKG G read and interpreted by me shows sinus tachycardia rate of 116 normal axis nonspecific ST-T wave changes similar to prior EKG.  ____________________________________________  RADIOLOGY  ED MD interpretation: Chest x-ray read by radiology reviewed by me shows no acute disease  Official radiology report(s): Dg Chest 2 View  Result Date: 11/28/2017 CLINICAL DATA:  Chest pain and hyperglycemia EXAM: CHEST - 2 VIEW COMPARISON:  Chest radiograph May 13, 2016 and chest CT May 14, 2016 FINDINGS: Lungs are clear. The heart size and pulmonary vascularity are normal. No adenopathy. No pneumothorax. No bone lesions. IMPRESSION: No edema or consolidation. Electronically Signed   By: Chrissie Noa  Margarita Grizzle III M.D.   On: 11/28/2017 14:23    ____________________________________________   PROCEDURES  Procedure(s) performed:   Procedures  Critical Care performed:   ____________________________________________   INITIAL IMPRESSION / ASSESSMENT AND PLAN / ED COURSE  Patient with exertional chest pain which sounds like chronic stable angina.  Patient also has mild DKA.  She tried to treat herself at home and this did not work.  I think in view of these 2 facts safest thing to do would be admit her.      ____________________________________________   FINAL CLINICAL IMPRESSION(S) / ED DIAGNOSES  Final diagnoses:  Diabetic ketoacidosis without coma associated with diabetes mellitus due to underlying condition (HCC)  Exertional angina [facility])     ED Discharge Orders    None       Note:  This document was prepared using Dragon voice recognition software and may include unintentional dictation errors.    Arnaldo Natal, MD 11/28/17 1513    Arnaldo Natal, MD 12/13/17 1929

## 2017-11-29 LAB — BASIC METABOLIC PANEL
ANION GAP: 11 (ref 5–15)
BUN: 18 mg/dL (ref 6–20)
CALCIUM: 8 mg/dL — AB (ref 8.9–10.3)
CO2: 19 mmol/L — ABNORMAL LOW (ref 22–32)
Chloride: 103 mmol/L (ref 98–111)
Creatinine, Ser: 0.85 mg/dL (ref 0.44–1.00)
GFR calc Af Amer: >60 mL/min (ref >60)
GFR calc non Af Amer: >60 mL/min (ref >60)
GLUCOSE: 491 mg/dL — AB (ref 70–99)
Potassium: 4.8 mmol/L (ref 3.5–5.1)
Sodium: 133 mmol/L — ABNORMAL LOW (ref 135–145)

## 2017-11-29 LAB — GLUCOSE, CAPILLARY
GLUCOSE-CAPILLARY: 481 mg/dL — AB (ref 70–99)
Glucose-Capillary: 320 mg/dL — ABNORMAL HIGH (ref 70–99)
Glucose-Capillary: 221 mg/dL — ABNORMAL HIGH (ref 70–99)

## 2017-11-29 MED ORDER — INSULIN GLARGINE 100 UNIT/ML ~~LOC~~ SOLN
12.0000 [IU] | SUBCUTANEOUS | Status: AC
  Administered 2017-11-29: 12 [IU] via SUBCUTANEOUS
  Filled 2017-11-29: qty 0.12

## 2017-11-29 MED ORDER — INSULIN GLARGINE 100 UNIT/ML ~~LOC~~ SOLN
12.0000 [IU] | Freq: Every day | SUBCUTANEOUS | Status: DC
  Filled 2017-11-29: qty 0.12

## 2017-11-29 MED ORDER — INSULIN ASPART 100 UNIT/ML ~~LOC~~ SOLN
3.0000 [IU] | Freq: Once | SUBCUTANEOUS | Status: AC
  Administered 2017-11-29: 3 [IU] via SUBCUTANEOUS
  Filled 2017-11-29: qty 1

## 2017-11-29 MED ORDER — INSULIN ASPART 100 UNIT/ML ~~LOC~~ SOLN: 0.0000 [IU] | Freq: Three times a day (TID) | SUBCUTANEOUS | 11 refills | Status: DC

## 2017-11-29 NOTE — Progress Notes (Signed)
Discharge order received. Patient is alert and oriented. Vital signs stable . No signs of acute distress. Discharge instructions given. Patient verbalized understanding. No other issues noted at this time.   

## 2017-11-29 NOTE — Clinical Social Work Note (Signed)
CSW was consulted for patient not having a driver's license. Patient has medicaid and thus can utilize [employer] transportation. Please re-consult CSW if a CSW need arises. York Spaniel MSW,LCSW [phone]

## 2017-11-29 NOTE — Progress Notes (Addendum)
Inpatient Diabetes Program Recommendations  AACE/ADA: New Consensus Statement on Inpatient Glycemic Control (2015)  Target Ranges:  Prepandial:   less than 140 mg/dL      Peak postprandial:   less than 180 mg/dL (1-2 hours)      Critically ill patients:  140 - 180 mg/dL   Lab Results  Component Value Date   GLUCAP 221 (H) 11/29/2017   HGBA1C 8.7 (H) 05/15/2016    Review of Glycemic ControlResults for CHACE, FLEER (MRN [id number]) as of 11/29/2017 12:44  Ref. Range 11/28/2017 19:26 11/28/2017 21:34 11/29/2017 04:41 11/29/2017 07:40 11/29/2017 12:22  Glucose-Capillary Latest Ref Range: 70 - 99 mg/dL 983 (H) 382 (H) 505 (H) 320 (H) 221 (H)    Diabetes history: Type 1 DM since age 27 Outpatient Diabetes medications: Lantus 12 units daily, Novolog 0-12 units tid with meals Current orders for Inpatient glycemic control:  Novolog moderate tid with meals and HS, Lantus 12 units q HS  Inpatient Diabetes Program Recommendations:    Note plans for patient to d/c.  Spoke with patient at length.  She is currently managed by her PCP as she has not seen Dr. Tedd Sias since October of 2017.    Patient states that she is not covering carbohydrates and only correcting blood sugars when she checks them.  Resources are a barrier for this patient.  She does not have a drivers license and her boyfriend works 10a-10p M-F during the week.  She states that he does the grocery shopping and buys "what he likes". He does not understand why she needs to eat more than 1-2 times a day and rarely buys fresh fruits, vegetables or meats.  She states that she did not f/u with Dr. Tedd Sias due to transportation/ child care issues.  She needs to be home to get daughter off the bus from school during the school year and does not have anyone to help her in the afternoons.  Her mother "sometimes" helps but her availability is sparse and her boyfriend does not like taking days off.  Overall she seems isolated and states that she worries about  "passing out" with her daughter around.  Therefore she is more comfortable running "high".  I had a frank conversation with her regarding complications of DM.  She states that her dad died from complications of DM when she was 27.  After he died, she states that her family "fell apart".  She moved in with her boyfriend when she was 27 and has been with him ever since.  Based on our conversation, boyfriend does not seem to understand Type 1 DM and just thinks she is being "lazy".  We discussed budget friendly ideas for meal prep and the possibility of making weekly menus and lists for grocery store.  I also discussed making small goals for blood sugar management including checking blood sugars more regularly and covering CHO intake.  Will discuss with MD.  Patient seems to understand the physiology of her disease process however states that everyone who has had DM in her family has not done well.  We discussed her motivation for caring for herself in order to care for her daughter.  Patient wants to be a good mother and a good example for her.  Encouraged her to f/u with endocrinology ASAP.  Will attempt to call to get her an appointment so that she will have ample time to arrange transportation.    Upon D/C consider: -Lantus 12 units daily - Novolog 1 unit for  every 15 grams of CHO  -Novolog correction- 181-220 mg/dL- 1 unit, 782-956 mg/dL-2 units, 213-086 mg/dL- 3 units, 578-469 mg/dL-4 units, 629-528 mg/dL- 5 units, 413-244 mg/dL- 6 units  Thanks,  Beryl Meager, RN, BC-ADM Inpatient Diabetes Coordinator Pager [phone] (8a-5p)  Addendum:  Called Dr. Pricilla Handler office and they state that they can see patient on Thursday morning 9 am- 12/01/17. Informed patient and she states that boyfriend can take her to her appointment.

## 2017-11-29 NOTE — Discharge Summary (Signed)
[facility] [place] [facility]   PATIENT NAME: Ashley Camacho    MR#:  [id number]  DATE OF BIRTH:  [birth year]  DATE OF ADMISSION:  11/28/2017 ADMITTING PHYSICIAN: Delfino Lovett, MD  DATE OF DISCHARGE: 11/29/2017  PRIMARY CARE PHYSICIAN: [facility]    ADMISSION DIAGNOSIS:  Exertional angina (HCC) [I20.8] Diabetic ketoacidosis without coma associated with diabetes mellitus due to underlying condition (HCC) [E08.10]  DISCHARGE DIAGNOSIS:  Active Problems:   DKA (diabetic ketoacidoses) (HCC)   SECONDARY DIAGNOSIS:   Past Medical History:  Diagnosis Date  . Diabetes mellitus without complication (HCC)   . Hypertension   . Type 1 diabetes [facility])     HOSPITAL COURSE:   27 year old female with past medical history of type 1 diabetes, hypertension who presented to the hospital due to uncontrolled blood sugars nausea and noted to be in acute diabetic ketoacidosis.  1.  Acute DKA-this is secondary to medical noncompliance. -Patient was admitted to the hospital placed on IV fluids, insulin drip.  Serial metabolic profiles were obtained.  Patient's anion gap is currently closed, her nausea has resolved.  She has been switched over from insulin drip to her long-acting insulin along with sliding scale coverage.  Blood sugars are stable. -She is now being discharged.  She will be referred to endocrinology as an outpatient as she has seen Dr. Tedd Sias in the past.  -Patient was seen by diabetes coordinator and she will continue her Lantus and is being placed on a sliding scale insulin based on her counting her carbohydrates.  2.  Depression-patient will continue her Cymbalta.  3.  Lesion/rash on the scalp- patient has has a erythematous scaly rash on her scalp.  She has had it for a few months.  No evidence of acute infection noted.  She needs to be referred to dermatology and we will make referral to [facility] dermatology upon discharge.  DISCHARGE  CONDITIONS:   Stable  CONSULTS OBTAINED:  Treatment Team:  Erin Fulling, MD  DRUG ALLERGIES:  No Known Allergies  DISCHARGE MEDICATIONS:   Allergies as of 11/29/2017   No Known Allergies     Medication List    TAKE these medications   D3-1000 1000 units tablet Generic drug:  Cholecalciferol Take 2,000 Units by mouth daily.   DULoxetine 30 MG capsule Commonly known as:  CYMBALTA Take 30 mg by mouth 2 (two) times daily.   insulin aspart 100 UNIT/ML injection Commonly known as:  novoLOG Inject 0-12 Units into the skin 3 (three) times daily before meals.   insulin glargine 100 UNIT/ML injection Commonly known as:  LANTUS Inject 12 Units into the skin daily.   medroxyPROGESTERone 150 MG/ML injection Commonly known as:  DEPO-PROVERA Inject 150 mg into the muscle every 3 (three) months.   metoCLOPramide 5 MG tablet Commonly known as:  REGLAN Take 1 tablet by mouth 3 (three) times daily as needed for nausea.         DISCHARGE INSTRUCTIONS:   DIET:  Diabetic diet  DISCHARGE CONDITION:  Stable  ACTIVITY:  Activity as tolerated  OXYGEN:  Home Oxygen: No.   Oxygen Delivery: room air  DISCHARGE LOCATION:  home   If you experience worsening of your admission symptoms, develop shortness of breath, life threatening emergency, suicidal or homicidal thoughts you must seek medical attention immediately by calling 911 or calling your MD immediately  if symptoms less severe.  You Must read complete instructions/literature along with all the possible adverse reactions/side effects for  all the Medicines you take and that have been prescribed to you. Take any new Medicines after you have completely understood and accpet all the possible adverse reactions/side effects.   Please note  You were cared for by a hospitalist during your hospital stay. If you have any questions about your discharge medications or the care you received while you were in the hospital after you  are discharged, you can call the unit and asked to speak with the hospitalist on call if the hospitalist that took care of you is not available. Once you are discharged, your primary care physician will handle any further medical issues. Please note that NO REFILLS for any discharge medications will be authorized once you are discharged, as it is imperative that you return to your primary care physician (or establish a relationship with a primary care physician if you do not have one) for your aftercare needs so that they can reassess your need for medications and monitor your lab values.     Today   Blood sugars much stable, no nausea, vomiting.  No other acute events overnight.  Will discharge home today.  VITAL SIGNS:  Blood pressure 100/71, pulse 96, temperature 98.2 F (36.8 C), temperature source Oral, resp. rate 18, height 5\' 1"  (1.549 m), weight 47 kg (103 lb 9.9 oz), SpO2 100 %.  I/O:    Intake/Output Summary (Last 24 hours) at 11/29/2017 1428 Last data filed at 11/29/2017 0600 Gross per 24 hour  Intake 240 ml  Output -  Net 240 ml    PHYSICAL EXAMINATION:  GENERAL:  27 y.o.-year-old patient lying in the bed with no acute distress.  EYES: Pupils equal, round, reactive to light and accommodation. No scleral icterus. Extraocular muscles intact.  HEENT: Head atraumatic, normocephalic. Oropharynx and nasopharynx clear.  NECK:  Supple, no jugular venous distention. No thyroid enlargement, no tenderness.  LUNGS: Normal breath sounds bilaterally, no wheezing, rales,rhonchi. No use of accessory muscles of respiration.  CARDIOVASCULAR: S1, S2 normal. No murmurs, rubs, or gallops.  ABDOMEN: Soft, non-tender, non-distended. Bowel sounds present. No organomegaly or mass.  EXTREMITIES: No pedal edema, cyanosis, or clubbing.  NEUROLOGIC: Cranial nerves II through XII are intact. No focal motor or sensory defecits b/l.  PSYCHIATRIC: The patient is alert and oriented x 3.  SKIN: No obvious  rash, lesion, or ulcer.  Dry scale lesion/rash on posterior aspect of the head.  No drainage noted. Itchy   DATA REVIEW:   CBC Recent Labs  Lab 11/28/17 1350  WBC 6.6  HGB 12.1  HCT 35.0  PLT 279    Chemistries  Recent Labs  Lab 11/29/17 0026  NA 133*  K 4.8  CL 103  CO2 19*  GLUCOSE 491*  BUN 18  CREATININE 0.85  CALCIUM 8.0*    Cardiac Enzymes Recent Labs  Lab 11/28/17 1350  TROPONINI <0.03    Microbiology Results  Results for orders placed or performed during the hospital encounter of 11/28/17  MRSA PCR Screening     Status: None   Collection Time: 11/28/17  4:31 PM  Result Value Ref Range Status   MRSA by PCR NEGATIVE NEGATIVE Final    Comment:        The GeneXpert MRSA Assay (FDA approved for NASAL specimens only), is one component of a comprehensive MRSA colonization surveillance program. It is not intended to diagnose MRSA infection nor to guide or monitor treatment for MRSA infections. Performed at [facility], [address], [place], [place]  [address]     RADIOLOGY:  Dg Chest 2 View  Result Date: 11/28/2017 CLINICAL DATA:  Chest pain and hyperglycemia EXAM: CHEST - 2 VIEW COMPARISON:  Chest radiograph May 13, 2016 and chest CT May 14, 2016 FINDINGS: Lungs are clear. The heart size and pulmonary vascularity are normal. No adenopathy. No pneumothorax. No bone lesions. IMPRESSION: No edema or consolidation. Electronically Signed   By: Bretta Bang III M.D.   On: 11/28/2017 14:23      Management plans discussed with the patient, family and they are in agreement.  CODE STATUS:     Code Status Orders  (From admission, onward)        Start     Ordered   11/28/17 1617  Full code  Continuous     11/28/17 1616     TOTAL TIME TAKING CARE OF THIS PATIENT: 40 minutes.    Houston Siren M.D on 11/29/2017 at 2:28 PM  Between 7am to 6pm - Pager - [phone]  After 6pm go to [email or web address] - Chartered loss adjuster [facility]  Office  [phone]  CC: Primary care physician; [employer] [employer]

## 2017-11-30 LAB — HIV ANTIBODY (ROUTINE TESTING W REFLEX): HIV SCREEN 4TH GENERATION: NONREACTIVE

## 2017-11-30 LAB — HEMOGLOBIN A1C
HEMOGLOBIN A1C: 12.7 % — AB (ref 4.8–5.6)
Mean Plasma Glucose: 318 mg/dL

## 2018-04-14 ENCOUNTER — Emergency Department
Admission: EM | Admit: 2018-04-14 | Discharge: 2018-04-14 | Disposition: A | Discharge status: Home / Self Care | Payer: Medicaid Other | Attending: Emergency Medicine | Admitting: Emergency Medicine

## 2018-04-14 ENCOUNTER — Other Ambulatory Visit: Payer: Self-pay

## 2018-04-14 ENCOUNTER — Encounter: Payer: Self-pay | Admitting: Emergency Medicine

## 2018-04-14 DIAGNOSIS — N3000 Acute cystitis without hematuria: Secondary | ICD-10-CM | POA: Diagnosis not present

## 2018-04-14 DIAGNOSIS — I10 Essential (primary) hypertension: Secondary | ICD-10-CM | POA: Insufficient documentation

## 2018-04-14 DIAGNOSIS — E871 Hypo-osmolality and hyponatremia: Secondary | ICD-10-CM | POA: Insufficient documentation

## 2018-04-14 DIAGNOSIS — E162 Hypoglycemia, unspecified: Secondary | ICD-10-CM | POA: Diagnosis present

## 2018-04-14 DIAGNOSIS — E1065 Type 1 diabetes mellitus with hyperglycemia: Secondary | ICD-10-CM | POA: Insufficient documentation

## 2018-04-14 DIAGNOSIS — R739 Hyperglycemia, unspecified: Secondary | ICD-10-CM

## 2018-04-14 DIAGNOSIS — F4323 Adjustment disorder with mixed anxiety and depressed mood: Secondary | ICD-10-CM | POA: Insufficient documentation

## 2018-04-14 LAB — BASIC METABOLIC PANEL
ANION GAP: 11 (ref 5–15)
BUN: 20 mg/dL (ref 6–20)
CO2: 26 mmol/L (ref 22–32)
Calcium: 8.9 mg/dL (ref 8.9–10.3)
Chloride: 95 mmol/L — ABNORMAL LOW (ref 98–111)
Creatinine, Ser: 0.76 mg/dL (ref 0.44–1.00)
GFR calc Af Amer: >60 mL/min (ref >60)
Glucose, Bld: 414 mg/dL — ABNORMAL HIGH (ref 70–99)
POTASSIUM: 3.8 mmol/L (ref 3.5–5.1)
SODIUM: 132 mmol/L — AB (ref 135–145)

## 2018-04-14 LAB — URINALYSIS, COMPLETE (UACMP) WITH MICROSCOPIC
BILIRUBIN URINE: NEGATIVE
Glucose, UA: >=500 mg/dL — AB
HGB URINE DIPSTICK: NEGATIVE
Ketones, ur: 5 mg/dL — AB
Leukocytes, UA: NEGATIVE
NITRITE: NEGATIVE
PROTEIN: NEGATIVE mg/dL
Specific Gravity, Urine: 1.031 — ABNORMAL HIGH (ref 1.005–1.030)
pH: 5 (ref 5.0–8.0)

## 2018-04-14 LAB — CBC
HCT: 34.4 % — ABNORMAL LOW (ref 36.0–46.0)
HEMOGLOBIN: 11.5 g/dL — AB (ref 12.0–15.0)
MCH: 29.1 pg (ref 26.0–34.0)
MCHC: 33.4 g/dL (ref 30.0–36.0)
MCV: 87.1 fL (ref 80.0–100.0)
Platelets: 233 10*3/uL (ref 150–400)
RBC: 3.95 MIL/uL (ref 3.87–5.11)
RDW: 12.1 % (ref 11.5–15.5)
WBC: 5.8 10*3/uL (ref 4.0–10.5)
nRBC: 0.3 % — ABNORMAL HIGH (ref 0.0–0.2)

## 2018-04-14 LAB — GLUCOSE, CAPILLARY
Glucose-Capillary: 442 mg/dL — ABNORMAL HIGH (ref 70–99)
Glucose-Capillary: 222 mg/dL — ABNORMAL HIGH (ref 70–99)

## 2018-04-14 MED ORDER — SODIUM CHLORIDE 0.9 % IV BOLUS
1000.0000 mL | Freq: Once | INTRAVENOUS | Status: AC
  Administered 2018-04-14: 1000 mL via INTRAVENOUS

## 2018-04-14 MED ORDER — SULFAMETHOXAZOLE-TRIMETHOPRIM 800-160 MG PO TABS
1.0000 | ORAL_TABLET | Freq: Once | ORAL | Status: AC
  Administered 2018-04-14: 1 via ORAL
  Filled 2018-04-14: qty 1

## 2018-04-14 MED ORDER — SULFAMETHOXAZOLE-TRIMETHOPRIM 800-160 MG PO TABS: 1.0000 | ORAL_TABLET | Freq: Two times a day (BID) | ORAL | 0 refills | Status: AC

## 2018-04-14 MED ORDER — INSULIN ASPART 100 UNIT/ML ~~LOC~~ SOLN: 10.0000 [IU] | Freq: Once | SUBCUTANEOUS | Status: DC

## 2018-04-14 NOTE — ED Notes (Signed)
POC preg negative.

## 2018-04-14 NOTE — ED Triage Notes (Signed)
Pt in via POV, sent over from PCP due to elevated blood sugars.  Pt is noncompliant Type 1 Diabetic, declines checking blood sugar regularly, states, "I just estimate how much Insulin to take" in regards to her Novolog.  Pt denies any complaints at this time.  Vitals WDL.

## 2018-04-14 NOTE — ED Provider Notes (Signed)
[facility] Emergency Department Provider Note  ____________________________________________  Time seen: Approximately 6:28 PM  I have reviewed the triage vital signs and the nursing notes.   HISTORY  Chief Complaint Hyperglycemia    HPI Ashley Camacho is a 27 y.o. female with a history of HTN and DM 1 presenting for hypoglycemia.  The patient states that she went for routine blood work to her PCPs office was found to have elevated blood sugars there.  At [facility], they are unable to do further laboratory studies are sent here for further evaluation.  The patient reports that she has been fired from the endocrinologist for no-shows.  She does not know how much insulin she is supposed to be taking, so just takes "what she feels like is right."  She had a single episode of nausea and vomiting 2 days ago, which resolved; she did not have any associated abdominal pain, constipation or diarrhea, fevers or chills.  She has not had any dysuria, urinary frequency or hematuria.  She reports a significant social barrier with being able to get to appointments; when I discussed the significant long-term effects, including death, of untreated hyperglycemia, she reports that her boyfriend cannot drive her "because he does not care about things like that."  Past Medical History:  Diagnosis Date  . Diabetes mellitus without complication (HCC)   . Hypertension   . Type 1 diabetes [facility])     Patient Active Problem List   Diagnosis Date Noted  . Adjustment disorder with mixed anxiety and depressed mood 05/14/2016  . DKA (diabetic ketoacidoses) (HCC) 04/03/2016    Past Surgical History:  Procedure Laterality Date  . CESAREAN SECTION    . MOUTH SURGERY      Current Outpatient Rx  . Order #: [id number] Class: Historical Med  . Order #: [id number] Class: Historical Med  . Order #: [id number] Class: No Print  . Order #: [id number] Class: Historical Med  . Order #:  [id number] Class: Historical Med  . Order #: [id number] Class: Historical Med    Allergies Patient has no known allergies.  Family History  Problem Relation Age of Onset  . Diabetes type I Father   . CAD Father   . CAD Paternal Grandmother   . CAD Paternal Grandfather     Social History Social History   Tobacco Use  . Smoking status: Never Smoker  . Smokeless tobacco: Never Used  Substance Use Topics  . Alcohol use: No  . Drug use: No    Review of Systems Constitutional: No fever/chills.  No lightheadedness or syncope. Eyes: No visual changes. ENT: No sore throat. No congestion or rhinorrhea. Cardiovascular: Denies chest pain. Denies palpitations. Respiratory: Denies shortness of breath.  No cough. Gastrointestinal: No abdominal pain.  Full episode of nausea and vomiting 2 days ago now resolved..  No diarrhea.  No constipation. Genitourinary: Negative for dysuria. Musculoskeletal: Negative for back pain. Skin: Negative for rash. Neurological: Negative for headaches. No focal numbness, tingling or weakness.  Endocrine:Positive hyperglycemia.   ____________________________________________   PHYSICAL EXAM:  VITAL SIGNS: ED Triage Vitals [04/14/18 1656]  Enc Vitals Group     BP 105/60     Pulse Rate (!) 107     Resp 16     Temp 97.7 F (36.5 C)     Temp Source Oral     SpO2 100 %     Weight 101 lb (45.8 kg)     Height 5\' 1"  (1.549 m)     Head Circumference  Peak Flow      Pain Score 0     Pain Loc      Pain Edu?      Excl. in [place]?     Constitutional: Alert and oriented.  Answers questions appropriately.  Tonically ill-appearing. Eyes: Conjunctivae are normal.  EOMI. No scleral icterus. Head: Atraumatic. Nose: No congestion/rhinnorhea. Mouth/Throat: Mucous membranes are moist.  Neck: No stridor.  Supple.   Cardiovascular: Normal rate, regular rhythm. No murmurs, rubs or gallops.  Respiratory: Normal respiratory effort.  No accessory muscle use or  retractions. Lungs CTAB.  No wheezes, rales or ronchi. Gastrointestinal: Soft, nontender and nondistended.  No guarding or rebound.  No peritoneal signs. Musculoskeletal: No LE edema. No ttp in the calves or palpable cords.  Negative Homan's sign. Neurologic:  A&Ox3.  Speech is clear.  Face and smile are symmetric.  EOMI.  Moves all extremities well. Skin:  Skin is warm, dry and intact. No rash noted. Psychiatric: Mood and affect are normal.   ____________________________________________   LABS (all labs ordered are listed, but only abnormal results are displayed)  Labs Reviewed  GLUCOSE, CAPILLARY - Abnormal; Notable for the following components:      Result Value   Glucose-Capillary 442 (*)    All other components within normal limits  BASIC METABOLIC PANEL - Abnormal; Notable for the following components:   Sodium 132 (*)    Chloride 95 (*)    Glucose, Bld 414 (*)    All other components within normal limits  CBC - Abnormal; Notable for the following components:   Hemoglobin 11.5 (*)    HCT 34.4 (*)    nRBC 0.3 (*)    All other components within normal limits  URINALYSIS, COMPLETE (UACMP) WITH MICROSCOPIC - Abnormal; Notable for the following components:   Color, Urine YELLOW (*)    APPearance CLEAR (*)    Specific Gravity, Urine 1.031 (*)    Glucose, UA >=500 (*)    Ketones, ur 5 (*)    Bacteria, UA RARE (*)    All other components within normal limits  URINE CULTURE  PREGNANCY, URINE  CBG MONITORING, ED  POC URINE PREG, ED   ____________________________________________  EKG  Not indicated ____________________________________________  RADIOLOGY  No results found.  ____________________________________________   PROCEDURES  Procedure(s) performed: None  Procedures  Critical Care performed: No ____________________________________________   INITIAL IMPRESSION / ASSESSMENT AND PLAN / ED COURSE  Pertinent labs & imaging results that were available  during my care of the patient were reviewed by me and considered in my medical decision making (see chart for details).  27 y.o. female with a history of DM 1 presenting with hyperglycemia from her PCPs office; she does not have a regular insulin regimen.  Overall, the patient is hemodynamically stable on my examination.  Here, she has hyperglycemia without DKA.  She does have some bacteria in her urine with leukocyte esterase, so we will treat her for UTI, with her first dose in the emergency department.  I am concerned about her significant social barriers to making her appointments.  We have talked at length about the importance of keeping appointments, different modalities by which she might be able to obtain transportation to her appointments.  I have put in a consult to social work to see if they would be able to help her with this.  ----------------------------------------- 7:26 PM on 04/14/2018 -----------------------------------------  Patient's blood sugars have improved, and she has received intravenous fluids.  She has been  treated for her UTI.  I have attempted to call the social worker, who is no longer available.  I have left a message for her with a consultation request for follow-up.  At this time, the patient will be discharged home.  Follow-up instructions as well as return precautions were discussed.  ____________________________________________  FINAL CLINICAL IMPRESSION(S) / ED DIAGNOSES  Final diagnoses:  Hyperglycemia  Acute cystitis without hematuria         NEW MEDICATIONS STARTED DURING THIS VISIT:  New Prescriptions   No medications on file      Rockne Menghini, MD 04/14/18 1927

## 2018-04-14 NOTE — Discharge Instructions (Addendum)
Drink plenty of fluid to stay well-hydrated.  Please take the entire course of antibiotics, even if you are feeling better.  Please make an appointment with your primary care physician, as well as with the endocrinologist to manage your diabetes.  Return to the emergency department for elevated blood sugars, nausea or vomiting, fevers, changes in mental status, or any other symptoms concerning to you.

## 2018-04-17 LAB — URINE CULTURE

## 2018-09-28 DIAGNOSIS — G629 Polyneuropathy, unspecified: Secondary | ICD-10-CM | POA: Insufficient documentation

## 2018-09-28 DIAGNOSIS — F32A Depression, unspecified: Secondary | ICD-10-CM | POA: Insufficient documentation

## 2018-09-28 DIAGNOSIS — F329 Major depressive disorder, single episode, unspecified: Secondary | ICD-10-CM | POA: Insufficient documentation

## 2019-05-30 ENCOUNTER — Other Ambulatory Visit: Payer: Self-pay

## 2019-05-30 ENCOUNTER — Encounter: Payer: Self-pay | Admitting: Emergency Medicine

## 2019-05-30 ENCOUNTER — Emergency Department
Admission: EM | Admit: 2019-05-30 | Discharge: 2019-05-30 | Disposition: A | Discharge status: Home / Self Care | Payer: Medicaid Other | Attending: Emergency Medicine | Admitting: Emergency Medicine

## 2019-05-30 DIAGNOSIS — Z20822 Contact with and (suspected) exposure to covid-19: Secondary | ICD-10-CM | POA: Diagnosis not present

## 2019-05-30 DIAGNOSIS — E10649 Type 1 diabetes mellitus with hypoglycemia without coma: Secondary | ICD-10-CM | POA: Diagnosis not present

## 2019-05-30 DIAGNOSIS — E860 Dehydration: Secondary | ICD-10-CM | POA: Diagnosis not present

## 2019-05-30 DIAGNOSIS — R112 Nausea with vomiting, unspecified: Secondary | ICD-10-CM | POA: Insufficient documentation

## 2019-05-30 DIAGNOSIS — Z794 Long term (current) use of insulin: Secondary | ICD-10-CM | POA: Diagnosis not present

## 2019-05-30 DIAGNOSIS — I10 Essential (primary) hypertension: Secondary | ICD-10-CM | POA: Diagnosis not present

## 2019-05-30 DIAGNOSIS — E1065 Type 1 diabetes mellitus with hyperglycemia: Secondary | ICD-10-CM

## 2019-05-30 DIAGNOSIS — Z79899 Other long term (current) drug therapy: Secondary | ICD-10-CM | POA: Diagnosis not present

## 2019-05-30 DIAGNOSIS — Z03818 Encounter for observation for suspected exposure to other biological agents ruled out: Secondary | ICD-10-CM | POA: Diagnosis not present

## 2019-05-30 LAB — URINALYSIS, COMPLETE (UACMP) WITH MICROSCOPIC
Bacteria, UA: NONE SEEN
Bilirubin Urine: NEGATIVE
Glucose, UA: >=500 mg/dL — AB
Ketones, ur: NEGATIVE mg/dL
Nitrite: NEGATIVE
Protein, ur: 30 mg/dL — AB
Specific Gravity, Urine: 1.025 (ref 1.005–1.030)
pH: 5 (ref 5.0–8.0)

## 2019-05-30 LAB — COMPREHENSIVE METABOLIC PANEL
ALT: 13 U/L (ref 0–44)
AST: 20 U/L (ref 15–41)
Albumin: 4.2 g/dL (ref 3.5–5.0)
Alkaline Phosphatase: 97 U/L (ref 38–126)
Anion gap: 8 (ref 5–15)
BUN: 16 mg/dL (ref 6–20)
CO2: 27 mmol/L (ref 22–32)
Calcium: 9.6 mg/dL (ref 8.9–10.3)
Chloride: 104 mmol/L (ref 98–111)
Creatinine, Ser: 0.63 mg/dL (ref 0.44–1.00)
GFR calc Af Amer: >60 mL/min (ref >60)
GFR calc non Af Amer: >60 mL/min (ref >60)
Glucose, Bld: 93 mg/dL (ref 70–99)
Potassium: 4 mmol/L (ref 3.5–5.1)
Sodium: 139 mmol/L (ref 135–145)
Total Bilirubin: 0.6 mg/dL (ref 0.3–1.2)
Total Protein: 8.1 g/dL (ref 6.5–8.1)

## 2019-05-30 LAB — CBC WITH DIFFERENTIAL/PLATELET
Abs Immature Granulocytes: 0.01 10*3/uL (ref 0.00–0.07)
Basophils Absolute: 0 10*3/uL (ref 0.0–0.1)
Basophils Relative: 1 %
Eosinophils Absolute: 0.1 10*3/uL (ref 0.0–0.5)
Eosinophils Relative: 2 %
HCT: 33.1 % — ABNORMAL LOW (ref 36.0–46.0)
Hemoglobin: 10.7 g/dL — ABNORMAL LOW (ref 12.0–15.0)
Immature Granulocytes: 0 %
Lymphocytes Relative: 23 %
Lymphs Abs: 1.3 10*3/uL (ref 0.7–4.0)
MCH: 27.8 pg (ref 26.0–34.0)
MCHC: 32.3 g/dL (ref 30.0–36.0)
MCV: 86 fL (ref 80.0–100.0)
Monocytes Absolute: 0.4 10*3/uL (ref 0.1–1.0)
Monocytes Relative: 7 %
Neutro Abs: 3.8 10*3/uL (ref 1.7–7.7)
Neutrophils Relative %: 67 %
Platelets: 241 10*3/uL (ref 150–400)
RBC: 3.85 MIL/uL — ABNORMAL LOW (ref 3.87–5.11)
RDW: 12.6 % (ref 11.5–15.5)
WBC: 5.7 10*3/uL (ref 4.0–10.5)
nRBC: 0 % (ref 0.0–0.2)

## 2019-05-30 LAB — POC SARS CORONAVIRUS 2 AG: SARS Coronavirus 2 Ag: NEGATIVE

## 2019-05-30 LAB — LIPASE, BLOOD: Lipase: 31 U/L (ref 11–51)

## 2019-05-30 MED ORDER — ONDANSETRON HCL 4 MG/2ML IJ SOLN
4.0000 mg | Freq: Once | INTRAMUSCULAR | Status: AC
  Administered 2019-05-30: 4 mg via INTRAVENOUS
  Filled 2019-05-30: qty 2

## 2019-05-30 MED ORDER — FAMOTIDINE 20 MG PO TABS: 20.0000 mg | ORAL_TABLET | Freq: Two times a day (BID) | ORAL | 0 refills | Status: DC

## 2019-05-30 MED ORDER — ONDANSETRON 4 MG PO TBDP: 4.0000 mg | ORAL_TABLET | Freq: Three times a day (TID) | ORAL | 0 refills | Status: DC | PRN

## 2019-05-30 MED ORDER — SODIUM CHLORIDE 0.9 % IV BOLUS
1000.0000 mL | Freq: Once | INTRAVENOUS | Status: AC
  Administered 2019-05-30: 1000 mL via INTRAVENOUS

## 2019-05-30 MED ORDER — DIPHENHYDRAMINE HCL 50 MG/ML IJ SOLN
25.0000 mg | Freq: Once | INTRAMUSCULAR | Status: AC
  Administered 2019-05-30: 09:00:00 25 mg via INTRAVENOUS
  Filled 2019-05-30: qty 1

## 2019-05-30 NOTE — ED Triage Notes (Signed)
Pt reports is a diabetic and started throwing up last night. Pt reports this am didn't feel well, checked her blood sugar and it was in the 500's so she took some insulin and it dropped really fast to 140. Pt reports feels nauseated. Denies pain.

## 2019-05-30 NOTE — Discharge Instructions (Signed)
There is a chance your symptoms are due to a coronavirus infection.  Your rapid Covid test in the ER was negative, and we will send a confirmatory test to the lab.  Please isolate at home until this result is available and you are feeling better.

## 2019-05-30 NOTE — ED Provider Notes (Signed)
[facility] Emergency Department Provider Note  ____________________________________________  Time seen: Approximately 9:20 AM  I have reviewed the triage vital signs and the nursing notes.   HISTORY  Chief Complaint Hypoglycemia and Emesis    HPI Ashley Camacho is a 29 y.o. female with a history of hypertension and type 1 diabetes on insulin who comes the ED complaining of malaise and fatigue for the past 2 nights.  Yesterday she started having vomiting, noticed her blood sugar was almost 600 so she took some insulin and drink fluids.  However, after falling asleep for a period of time she woke up and noticed that her blood sugar is 140 and is worried about not becoming hypoglycemic.  She continues to feel nauseated and was unable to eat to increase her blood sugar levels.  Does have a history of DKA.  No known Covid exposure.  No diarrhea.  Nausea is constant, no aggravating or alleviating factors.      Past Medical History:  Diagnosis Date  . Diabetes mellitus without complication (HCC)   . Hypertension   . Type 1 diabetes [facility])      Patient Active Problem List   Diagnosis Date Noted  . Adjustment disorder with mixed anxiety and depressed mood 05/14/2016  . DKA (diabetic ketoacidoses) (HCC) 04/03/2016     Past Surgical History:  Procedure Laterality Date  . CESAREAN SECTION    . MOUTH SURGERY       Prior to Admission medications   Medication Sig Start Date End Date Taking? Authorizing Provider  D3-1000 1000 units tablet Take 2,000 Units by mouth daily. 08/30/17   [provider]  DULoxetine (CYMBALTA) 30 MG capsule Take 30 mg by mouth 2 (two) times daily. 08/29/17   [provider]  famotidine (PEPCID) 20 MG tablet Take 1 tablet (20 mg total) by mouth 2 (two) times daily. 05/30/19   Sharman Cheek, MD  insulin aspart (NOVOLOG) 100 UNIT/ML injection Inject 0-12 Units into the skin 3 (three) times daily before meals. 11/29/17    Houston Siren, MD  insulin glargine (LANTUS) 100 UNIT/ML injection Inject 12 Units into the skin daily.     [provider]  medroxyPROGESTERone (DEPO-PROVERA) 150 MG/ML injection Inject 150 mg into the muscle every 3 (three) months.    [provider]  metoCLOPramide (REGLAN) 5 MG tablet Take 1 tablet by mouth 3 (three) times daily as needed for nausea.  04/22/16   [provider]  ondansetron (ZOFRAN ODT) 4 MG disintegrating tablet Take 1 tablet (4 mg total) by mouth every 8 (eight) hours as needed for nausea or vomiting. 05/30/19   Sharman Cheek, MD     Allergies Patient has no known allergies.   Family History  Problem Relation Age of Onset  . Diabetes type I Father   . CAD Father   . CAD Paternal Grandmother   . CAD Paternal Grandfather     Social History Social History   Tobacco Use  . Smoking status: Never Smoker  . Smokeless tobacco: Never Used  Substance Use Topics  . Alcohol use: No  . Drug use: No    Review of Systems  Constitutional:   No fever or chills.  ENT:   No sore throat. No rhinorrhea. Cardiovascular:   No chest pain or syncope. Respiratory:   No dyspnea or cough. Gastrointestinal:   Negative for abdominal pain, positive vomiting Musculoskeletal:   Negative for focal pain or swelling All other systems reviewed and are  negative except as documented above in ROS and HPI.  ____________________________________________   PHYSICAL EXAM:  VITAL SIGNS: ED Triage Vitals  Enc Vitals Group     BP 05/30/19 0816 105/81     Pulse Rate 05/30/19 0816 (!) 124     Resp 05/30/19 0816 19     Temp 05/30/19 0816 97.9 F (36.6 C)     Temp Source 05/30/19 0816 Oral     SpO2 05/30/19 0816 97 %     Weight 05/30/19 0812 110 lb (49.9 kg)     Height 05/30/19 0812 5\' 1"  (1.549 m)     Head Circumference --      Peak Flow --      Pain Score 05/30/19 0812 0     Pain Loc --      Pain Edu? --      Excl. in [place]? --     Vital signs  reviewed, nursing assessments reviewed.   Constitutional:   Alert and oriented. Non-toxic appearance. Eyes:   Conjunctivae are normal. EOMI. PERRL. ENT      Head:   Normocephalic and atraumatic.      Nose:   Wearing a mask.      Mouth/Throat:   Wearing a mask.      Neck:   No meningismus. Full ROM. Hematological/Lymphatic/Immunilogical:   No cervical lymphadenopathy. Cardiovascular:   Tachycardia heart rate 120. Symmetric bilateral radial and DP pulses.  No murmurs. Cap refill less than 2 seconds. Respiratory:   Normal respiratory effort without tachypnea/retractions. Breath sounds are clear and equal bilaterally. No wheezes/rales/rhonchi. Gastrointestinal:   Soft and nontender. Non distended. There is no CVA tenderness.  No rebound, rigidity, or guarding. Musculoskeletal:   Normal range of motion in all extremities. No joint effusions.  No lower extremity tenderness.  No edema. Neurologic:   Normal speech and language.  Motor grossly intact. No acute focal neurologic deficits are appreciated.  Skin:    Skin is warm, dry and intact. No rash noted.  No petechiae, purpura, or bullae.  ____________________________________________    LABS (pertinent positives/negatives) (all labs ordered are listed, but only abnormal results are displayed) Labs Reviewed  CBC WITH DIFFERENTIAL/PLATELET - Abnormal; Notable for the following components:      Result Value   RBC 3.85 (*)    Hemoglobin 10.7 (*)    HCT 33.1 (*)    All other components within normal limits  URINALYSIS, COMPLETE (UACMP) WITH MICROSCOPIC - Abnormal; Notable for the following components:   Color, Urine YELLOW (*)    APPearance HAZY (*)    Glucose, UA >=500 (*)    Hgb urine dipstick SMALL (*)    Protein, ur 30 (*)    Leukocytes,Ua SMALL (*)    All other components within normal limits  NOVEL CORONAVIRUS, NAA (HOSP ORDER, SEND-OUT TO REF LAB; TAT 18-24 HRS)  COMPREHENSIVE METABOLIC PANEL  LIPASE, BLOOD  POC SARS  CORONAVIRUS 2 AG -  ED  POC SARS CORONAVIRUS 2 AG  CBG MONITORING, ED   ____________________________________________   EKG    ____________________________________________    RADIOLOGY  No results found.  ____________________________________________   PROCEDURES Procedures  ____________________________________________    CLINICAL IMPRESSION / ASSESSMENT AND PLAN / ED COURSE  Medications ordered in the ED: Medications  sodium chloride 0.9 % bolus 1,000 mL (1,000 mLs Intravenous New Bag/Given 05/30/19 0908)  ondansetron (ZOFRAN) injection 4 mg (4 mg Intravenous Given 05/30/19 0917)  diphenhydrAMINE (BENADRYL) injection 25 mg (25 mg Intravenous Given 05/30/19 0917)  Pertinent labs & imaging results that were available during my care of the patient were reviewed by me and considered in my medical decision making (see chart for details).  Ashley Camacho was evaluated in Emergency Department on 05/30/2019 for the symptoms described in the history of present illness. She was evaluated in the context of the global COVID-19 pandemic, which necessitated consideration that the patient might be at risk for infection with the SARS-CoV-2 virus that causes COVID-19. Institutional protocols and algorithms that pertain to the evaluation of patients at risk for COVID-19 are in a state of rapid change based on information released by regulatory bodies including the CDC and federal and state organizations. These policies and algorithms were followed during the patient's care in the ED.   patient presents with nausea vomiting with benign abdominal exam.  She has tachycardia indicative of dehydration most likely.  Will check labs to evaluate for possible DKA, glucose monitoring, some congestion control with IV fluids and IV Zofran and Benadryl. Considering the patient's symptoms, medical history, and physical examination today, I have low suspicion for cholecystitis or biliary pathology, pancreatitis,  perforation or bowel obstruction, hernia, intra-abdominal abscess, AAA or dissection, volvulus or intussusception, mesenteric ischemia, or appendicitis.  Given the uncontrolled pandemic I will check a Covid test. Clinical Course as of May 29 1313  Wed May 30, 2019  6808 Electrolytes normal, blood sugar 93.  If able to tolerate oral intake she is stable for discharge home.   [PS]  1312 Feeling better, tolerating oral intake.  Vital signs are normal, euglycemic.  Stable for discharge home.   [PS]    Clinical Course User Index [PS] Sharman Cheek, MD     ____________________________________________   FINAL CLINICAL IMPRESSION(S) / ED DIAGNOSES    Final diagnoses:  Non-intractable vomiting with nausea, unspecified vomiting type  Dehydration  Type 1 diabetes mellitus with hyperglycemia (HCC)  Suspected COVID-19 virus infection     ED Discharge Orders         Ordered    ondansetron (ZOFRAN ODT) 4 MG disintegrating tablet  Every 8 hours PRN     05/30/19 1315    famotidine (PEPCID) 20 MG tablet  2 times daily     05/30/19 1315          Portions of this note were generated with dragon dictation software. Dictation errors may occur despite best attempts at proofreading.   Sharman Cheek, MD 05/30/19 1316

## 2019-05-31 LAB — NOVEL CORONAVIRUS, NAA (HOSP ORDER, SEND-OUT TO REF LAB; TAT 18-24 HRS): SARS-CoV-2, NAA: NOT DETECTED

## 2019-06-01 DIAGNOSIS — E860 Dehydration: Secondary | ICD-10-CM | POA: Diagnosis not present

## 2019-07-10 DIAGNOSIS — F330 Major depressive disorder, recurrent, mild: Secondary | ICD-10-CM | POA: Diagnosis not present

## 2019-07-25 DIAGNOSIS — F331 Major depressive disorder, recurrent, moderate: Secondary | ICD-10-CM | POA: Diagnosis not present

## 2019-07-25 DIAGNOSIS — F419 Anxiety disorder, unspecified: Secondary | ICD-10-CM | POA: Diagnosis not present

## 2019-08-07 DIAGNOSIS — F331 Major depressive disorder, recurrent, moderate: Secondary | ICD-10-CM | POA: Diagnosis not present

## 2019-08-07 DIAGNOSIS — F419 Anxiety disorder, unspecified: Secondary | ICD-10-CM | POA: Diagnosis not present

## 2019-08-29 DIAGNOSIS — R531 Weakness: Secondary | ICD-10-CM | POA: Diagnosis not present

## 2019-08-29 DIAGNOSIS — Z20822 Contact with and (suspected) exposure to covid-19: Secondary | ICD-10-CM | POA: Diagnosis not present

## 2019-08-29 DIAGNOSIS — R109 Unspecified abdominal pain: Secondary | ICD-10-CM | POA: Diagnosis not present

## 2019-08-29 DIAGNOSIS — A084 Viral intestinal infection, unspecified: Secondary | ICD-10-CM | POA: Diagnosis not present

## 2019-09-20 DIAGNOSIS — F419 Anxiety disorder, unspecified: Secondary | ICD-10-CM | POA: Diagnosis not present

## 2019-09-20 DIAGNOSIS — F331 Major depressive disorder, recurrent, moderate: Secondary | ICD-10-CM | POA: Diagnosis not present

## 2019-09-26 DIAGNOSIS — F331 Major depressive disorder, recurrent, moderate: Secondary | ICD-10-CM | POA: Diagnosis not present

## 2019-09-26 DIAGNOSIS — F419 Anxiety disorder, unspecified: Secondary | ICD-10-CM | POA: Diagnosis not present

## 2019-10-10 DIAGNOSIS — F419 Anxiety disorder, unspecified: Secondary | ICD-10-CM | POA: Diagnosis not present

## 2019-10-10 DIAGNOSIS — F331 Major depressive disorder, recurrent, moderate: Secondary | ICD-10-CM | POA: Diagnosis not present

## 2019-10-25 DIAGNOSIS — F419 Anxiety disorder, unspecified: Secondary | ICD-10-CM | POA: Diagnosis not present

## 2019-10-25 DIAGNOSIS — F331 Major depressive disorder, recurrent, moderate: Secondary | ICD-10-CM | POA: Diagnosis not present

## 2019-10-30 DIAGNOSIS — E1065 Type 1 diabetes mellitus with hyperglycemia: Secondary | ICD-10-CM | POA: Diagnosis not present

## 2019-10-30 DIAGNOSIS — M25559 Pain in unspecified hip: Secondary | ICD-10-CM | POA: Diagnosis not present

## 2019-11-06 DIAGNOSIS — Z1159 Encounter for screening for other viral diseases: Secondary | ICD-10-CM | POA: Diagnosis not present

## 2019-11-13 DIAGNOSIS — F331 Major depressive disorder, recurrent, moderate: Secondary | ICD-10-CM | POA: Diagnosis not present

## 2019-11-13 DIAGNOSIS — F419 Anxiety disorder, unspecified: Secondary | ICD-10-CM | POA: Diagnosis not present

## 2019-11-19 DIAGNOSIS — H5213 Myopia, bilateral: Secondary | ICD-10-CM | POA: Diagnosis not present

## 2019-11-19 DIAGNOSIS — E113293 Type 2 diabetes mellitus with mild nonproliferative diabetic retinopathy without macular edema, bilateral: Secondary | ICD-10-CM | POA: Diagnosis not present

## 2019-11-20 DIAGNOSIS — R112 Nausea with vomiting, unspecified: Secondary | ICD-10-CM | POA: Diagnosis not present

## 2019-12-05 DIAGNOSIS — F331 Major depressive disorder, recurrent, moderate: Secondary | ICD-10-CM | POA: Diagnosis not present

## 2019-12-05 DIAGNOSIS — F419 Anxiety disorder, unspecified: Secondary | ICD-10-CM | POA: Diagnosis not present

## 2019-12-07 DIAGNOSIS — H5213 Myopia, bilateral: Secondary | ICD-10-CM | POA: Diagnosis not present

## 2019-12-07 DIAGNOSIS — F331 Major depressive disorder, recurrent, moderate: Secondary | ICD-10-CM | POA: Diagnosis not present

## 2020-01-03 DIAGNOSIS — E1065 Type 1 diabetes mellitus with hyperglycemia: Secondary | ICD-10-CM | POA: Diagnosis not present

## 2020-01-03 DIAGNOSIS — E1042 Type 1 diabetes mellitus with diabetic polyneuropathy: Secondary | ICD-10-CM | POA: Diagnosis not present

## 2020-01-03 DIAGNOSIS — E1029 Type 1 diabetes mellitus with other diabetic kidney complication: Secondary | ICD-10-CM | POA: Diagnosis not present

## 2020-01-03 DIAGNOSIS — H3500 Unspecified background retinopathy: Secondary | ICD-10-CM | POA: Diagnosis not present

## 2020-01-22 DIAGNOSIS — H3500 Unspecified background retinopathy: Secondary | ICD-10-CM | POA: Diagnosis not present

## 2020-01-22 DIAGNOSIS — E1042 Type 1 diabetes mellitus with diabetic polyneuropathy: Secondary | ICD-10-CM | POA: Diagnosis not present

## 2020-01-22 DIAGNOSIS — E1065 Type 1 diabetes mellitus with hyperglycemia: Secondary | ICD-10-CM | POA: Diagnosis not present

## 2020-01-22 DIAGNOSIS — E1029 Type 1 diabetes mellitus with other diabetic kidney complication: Secondary | ICD-10-CM | POA: Diagnosis not present

## 2020-01-24 DIAGNOSIS — F331 Major depressive disorder, recurrent, moderate: Secondary | ICD-10-CM | POA: Diagnosis not present

## 2020-01-26 DIAGNOSIS — F331 Major depressive disorder, recurrent, moderate: Secondary | ICD-10-CM | POA: Diagnosis not present

## 2020-02-05 DIAGNOSIS — F331 Major depressive disorder, recurrent, moderate: Secondary | ICD-10-CM | POA: Diagnosis not present

## 2020-02-06 DIAGNOSIS — F331 Major depressive disorder, recurrent, moderate: Secondary | ICD-10-CM | POA: Diagnosis not present

## 2020-02-07 DIAGNOSIS — F331 Major depressive disorder, recurrent, moderate: Secondary | ICD-10-CM | POA: Diagnosis not present

## 2020-02-21 DIAGNOSIS — K3184 Gastroparesis: Secondary | ICD-10-CM | POA: Diagnosis not present

## 2020-02-21 DIAGNOSIS — E1042 Type 1 diabetes mellitus with diabetic polyneuropathy: Secondary | ICD-10-CM | POA: Diagnosis not present

## 2020-02-21 DIAGNOSIS — R809 Proteinuria, unspecified: Secondary | ICD-10-CM | POA: Diagnosis not present

## 2020-02-21 DIAGNOSIS — N289 Disorder of kidney and ureter, unspecified: Secondary | ICD-10-CM | POA: Diagnosis not present

## 2020-02-21 DIAGNOSIS — H3500 Unspecified background retinopathy: Secondary | ICD-10-CM | POA: Diagnosis not present

## 2020-02-21 DIAGNOSIS — E1043 Type 1 diabetes mellitus with diabetic autonomic (poly)neuropathy: Secondary | ICD-10-CM | POA: Diagnosis not present

## 2020-03-04 DIAGNOSIS — E1042 Type 1 diabetes mellitus with diabetic polyneuropathy: Secondary | ICD-10-CM | POA: Diagnosis not present

## 2020-03-04 DIAGNOSIS — E1065 Type 1 diabetes mellitus with hyperglycemia: Secondary | ICD-10-CM | POA: Diagnosis not present

## 2020-03-04 DIAGNOSIS — E10649 Type 1 diabetes mellitus with hypoglycemia without coma: Secondary | ICD-10-CM | POA: Diagnosis not present

## 2020-04-10 DIAGNOSIS — K3184 Gastroparesis: Secondary | ICD-10-CM | POA: Diagnosis not present

## 2020-04-10 DIAGNOSIS — E1043 Type 1 diabetes mellitus with diabetic autonomic (poly)neuropathy: Secondary | ICD-10-CM | POA: Diagnosis not present

## 2020-04-10 DIAGNOSIS — E1042 Type 1 diabetes mellitus with diabetic polyneuropathy: Secondary | ICD-10-CM | POA: Diagnosis not present

## 2020-04-10 DIAGNOSIS — R809 Proteinuria, unspecified: Secondary | ICD-10-CM | POA: Diagnosis not present

## 2020-04-10 DIAGNOSIS — E162 Hypoglycemia, unspecified: Secondary | ICD-10-CM | POA: Diagnosis not present

## 2020-04-10 DIAGNOSIS — E109 Type 1 diabetes mellitus without complications: Secondary | ICD-10-CM | POA: Insufficient documentation

## 2020-05-22 DIAGNOSIS — E1042 Type 1 diabetes mellitus with diabetic polyneuropathy: Secondary | ICD-10-CM | POA: Diagnosis not present

## 2020-05-22 DIAGNOSIS — E1043 Type 1 diabetes mellitus with diabetic autonomic (poly)neuropathy: Secondary | ICD-10-CM | POA: Diagnosis not present

## 2020-05-22 DIAGNOSIS — K3184 Gastroparesis: Secondary | ICD-10-CM | POA: Diagnosis not present

## 2020-05-22 DIAGNOSIS — R809 Proteinuria, unspecified: Secondary | ICD-10-CM | POA: Diagnosis not present

## 2020-05-24 NOTE — L&D Delivery Note (Signed)
Pt to CT at this time.

## 2020-05-24 NOTE — L&D Delivery Note (Signed)
Pt's CBG at this time is 220. Pt bolused herself 1u insulin from pump for coverage.

## 2020-06-10 DIAGNOSIS — Z20822 Contact with and (suspected) exposure to covid-19: Secondary | ICD-10-CM | POA: Diagnosis not present

## 2020-06-24 DIAGNOSIS — Z1152 Encounter for screening for COVID-19: Secondary | ICD-10-CM | POA: Diagnosis not present

## 2020-07-01 DIAGNOSIS — Z23 Encounter for immunization: Secondary | ICD-10-CM | POA: Diagnosis not present

## 2020-07-01 DIAGNOSIS — E1142 Type 2 diabetes mellitus with diabetic polyneuropathy: Secondary | ICD-10-CM | POA: Diagnosis not present

## 2020-07-01 DIAGNOSIS — E1165 Type 2 diabetes mellitus with hyperglycemia: Secondary | ICD-10-CM | POA: Diagnosis not present

## 2020-07-01 DIAGNOSIS — R809 Proteinuria, unspecified: Secondary | ICD-10-CM | POA: Diagnosis not present

## 2020-07-01 DIAGNOSIS — N289 Disorder of kidney and ureter, unspecified: Secondary | ICD-10-CM | POA: Diagnosis not present

## 2020-07-01 DIAGNOSIS — E1143 Type 2 diabetes mellitus with diabetic autonomic (poly)neuropathy: Secondary | ICD-10-CM | POA: Diagnosis not present

## 2020-07-01 DIAGNOSIS — K3184 Gastroparesis: Secondary | ICD-10-CM | POA: Diagnosis not present

## 2020-07-01 DIAGNOSIS — E1042 Type 1 diabetes mellitus with diabetic polyneuropathy: Secondary | ICD-10-CM | POA: Diagnosis not present

## 2020-07-08 DIAGNOSIS — K3184 Gastroparesis: Secondary | ICD-10-CM | POA: Diagnosis not present

## 2020-07-08 DIAGNOSIS — H3500 Unspecified background retinopathy: Secondary | ICD-10-CM | POA: Diagnosis not present

## 2020-07-08 DIAGNOSIS — E1065 Type 1 diabetes mellitus with hyperglycemia: Secondary | ICD-10-CM | POA: Diagnosis not present

## 2020-07-08 DIAGNOSIS — E1042 Type 1 diabetes mellitus with diabetic polyneuropathy: Secondary | ICD-10-CM | POA: Diagnosis not present

## 2020-07-08 DIAGNOSIS — E109 Type 1 diabetes mellitus without complications: Secondary | ICD-10-CM | POA: Diagnosis not present

## 2020-07-22 DIAGNOSIS — Z9119 Patient's noncompliance with other medical treatment and regimen: Secondary | ICD-10-CM | POA: Diagnosis not present

## 2020-07-22 DIAGNOSIS — E1065 Type 1 diabetes mellitus with hyperglycemia: Secondary | ICD-10-CM | POA: Diagnosis not present

## 2020-07-22 DIAGNOSIS — E1042 Type 1 diabetes mellitus with diabetic polyneuropathy: Secondary | ICD-10-CM | POA: Diagnosis not present

## 2020-07-22 DIAGNOSIS — E109 Type 1 diabetes mellitus without complications: Secondary | ICD-10-CM | POA: Diagnosis not present

## 2020-07-22 DIAGNOSIS — K3184 Gastroparesis: Secondary | ICD-10-CM | POA: Diagnosis not present

## 2020-08-12 DIAGNOSIS — E1065 Type 1 diabetes mellitus with hyperglycemia: Secondary | ICD-10-CM | POA: Diagnosis not present

## 2020-08-12 DIAGNOSIS — E162 Hypoglycemia, unspecified: Secondary | ICD-10-CM | POA: Diagnosis not present

## 2020-08-12 DIAGNOSIS — E1042 Type 1 diabetes mellitus with diabetic polyneuropathy: Secondary | ICD-10-CM | POA: Diagnosis not present

## 2020-08-27 ENCOUNTER — Emergency Department: Payer: Medicaid Other

## 2020-08-27 ENCOUNTER — Other Ambulatory Visit: Payer: Self-pay

## 2020-08-27 ENCOUNTER — Emergency Department
Admission: EM | Admit: 2020-08-27 | Discharge: 2020-08-27 | Disposition: A | Discharge status: Home / Self Care | Payer: Medicaid Other | Attending: Emergency Medicine | Admitting: Emergency Medicine

## 2020-08-27 DIAGNOSIS — O10011 Pre-existing essential hypertension complicating pregnancy, first trimester: Secondary | ICD-10-CM | POA: Insufficient documentation

## 2020-08-27 DIAGNOSIS — R102 Pelvic and perineal pain: Secondary | ICD-10-CM | POA: Insufficient documentation

## 2020-08-27 DIAGNOSIS — Z3A09 9 weeks gestation of pregnancy: Secondary | ICD-10-CM | POA: Insufficient documentation

## 2020-08-27 DIAGNOSIS — O24011 Pre-existing diabetes mellitus, type 1, in pregnancy, first trimester: Secondary | ICD-10-CM | POA: Insufficient documentation

## 2020-08-27 DIAGNOSIS — O208 Other hemorrhage in early pregnancy: Secondary | ICD-10-CM | POA: Diagnosis not present

## 2020-08-27 DIAGNOSIS — E1010 Type 1 diabetes mellitus with ketoacidosis without coma: Secondary | ICD-10-CM | POA: Insufficient documentation

## 2020-08-27 DIAGNOSIS — Z794 Long term (current) use of insulin: Secondary | ICD-10-CM | POA: Insufficient documentation

## 2020-08-27 DIAGNOSIS — O209 Hemorrhage in early pregnancy, unspecified: Secondary | ICD-10-CM

## 2020-08-27 DIAGNOSIS — O219 Vomiting of pregnancy, unspecified: Secondary | ICD-10-CM

## 2020-08-27 DIAGNOSIS — N939 Abnormal uterine and vaginal bleeding, unspecified: Secondary | ICD-10-CM | POA: Diagnosis not present

## 2020-08-27 LAB — URINALYSIS, COMPLETE (UACMP) WITH MICROSCOPIC
Glucose, UA: 100 mg/dL — AB
Ketones, ur: 15 mg/dL — AB
Nitrite: NEGATIVE
Protein, ur: 100 mg/dL — AB
Specific Gravity, Urine: >1.03 — ABNORMAL HIGH (ref 1.005–1.030)
pH: 5.5 (ref 5.0–8.0)

## 2020-08-27 LAB — POC URINE PREG, ED: Preg Test, Ur: POSITIVE — AB

## 2020-08-27 LAB — HCG, QUANTITATIVE, PREGNANCY: hCG, Beta Chain, Quant, S: 330628 m[IU]/mL — ABNORMAL HIGH (ref <5)

## 2020-08-27 LAB — CBG MONITORING, ED: Glucose-Capillary: 152 mg/dL — ABNORMAL HIGH (ref 70–99)

## 2020-08-27 MED ORDER — METOCLOPRAMIDE HCL 10 MG PO TABS: 20.0000 mg | ORAL_TABLET | Freq: Three times a day (TID) | ORAL | 1 refills | Status: DC | PRN

## 2020-08-27 NOTE — ED Triage Notes (Signed)
Pt to ER via POV with complaints of nausea and vomiting that started on Monday. Reports she will wake up nauseated and vomit x1 at approx 6am, then spend the rest of the day nauseous. Reports decreased appetite.  Pt reports she has irregular periods and is unsure of her last.

## 2020-08-27 NOTE — ED Notes (Signed)
Pt back from x-ray.

## 2020-08-27 NOTE — ED Provider Notes (Signed)
[facility] Emergency Department Provider Note   ____________________________________________   Event Date/Time   First MD Initiated Contact with Patient 08/27/20 [phone]     (approximate)  I have reviewed the triage vital signs and the nursing notes.   HISTORY  Chief Complaint Emesis    HPI Ashley Camacho is a 30 y.o. female patient presents with 2 weeks of AN nausea and vomiting.  Patient states she continued to have nausea with no vomiting throughout the day.  Patient also states irregular periods and unsure of last regular menstrual cycle.  Voiced concern for pregnancy.  Patient mild abdominal/ pelvic pain.  Patient denies dysuria or vaginal discharge.      Past Medical History:  Diagnosis Date  . Diabetes mellitus without complication (HCC)   . Hypertension   . Type 1 diabetes [facility])     Patient Active Problem List   Diagnosis Date Noted  . Adjustment disorder with mixed anxiety and depressed mood 05/14/2016  . DKA (diabetic ketoacidoses) 04/03/2016    Past Surgical History:  Procedure Laterality Date  . CESAREAN SECTION    . MOUTH SURGERY      Prior to Admission medications   Medication Sig Start Date End Date Taking? Authorizing Provider  metoCLOPramide (REGLAN) 10 MG tablet Take 2 tablets (20 mg total) by mouth every 8 (eight) hours as needed for nausea. 08/27/20 08/27/21 Yes Joni Reining, PA-C  D3-1000 1000 units tablet Take 2,000 Units by mouth daily. 08/30/17   [provider]  DULoxetine (CYMBALTA) 30 MG capsule Take 30 mg by mouth 2 (two) times daily. 08/29/17   [provider]  famotidine (PEPCID) 20 MG tablet Take 1 tablet (20 mg total) by mouth 2 (two) times daily. 05/30/19   Sharman Cheek, MD  insulin aspart (NOVOLOG) 100 UNIT/ML injection Inject 0-12 Units into the skin 3 (three) times daily before meals. 11/29/17   Houston Siren, MD  insulin glargine (LANTUS) 100 UNIT/ML injection Inject 12 Units into the skin  daily.     [provider]  medroxyPROGESTERone (DEPO-PROVERA) 150 MG/ML injection Inject 150 mg into the muscle every 3 (three) months.    [provider]  ondansetron (ZOFRAN ODT) 4 MG disintegrating tablet Take 1 tablet (4 mg total) by mouth every 8 (eight) hours as needed for nausea or vomiting. 05/30/19   Sharman Cheek, MD    Allergies Patient has no known allergies.  Family History  Problem Relation Age of Onset  . Diabetes type I Father   . CAD Father   . CAD Paternal Grandmother   . CAD Paternal Grandfather     Social History Social History   Tobacco Use  . Smoking status: Never Smoker  . Smokeless tobacco: Never Used  Vaping Use  . Vaping Use: Never used  Substance Use Topics  . Alcohol use: No  . Drug use: No    Review of Systems Constitutional: No fever/chills Eyes: No visual changes. ENT: No sore throat. Cardiovascular: Denies chest pain. Respiratory: Denies shortness of breath. Gastrointestinal: No abdominal pain.  Nausea and vomiting.  No diarrhea.  No constipation. Genitourinary: Negative for dysuria. Musculoskeletal: Negative for back pain. Skin: Negative for rash. Neurological: Negative for headaches, focal weakness or numbness. Endocrine:  Diabetes and hypertension ____________________________________________   PHYSICAL EXAM:  VITAL SIGNS: ED Triage Vitals  Enc Vitals Group     BP 08/27/20 0949 121/79     Pulse Rate 08/27/20 0949 88     Resp  08/27/20 0949 16     Temp 08/27/20 0949 98.4 F (36.9 C)     Temp Source 08/27/20 0949 Oral     SpO2 08/27/20 0949 100 %     Weight 08/27/20 0950 114 lb (51.7 kg)     Height 08/27/20 0950 5\' 1"  (1.549 m)     Head Circumference --      Peak Flow --      Pain Score 08/27/20 0950 5     Pain Loc --      Pain Edu? --      Excl. in GC? --    Constitutional: Alert and oriented. Well appearing and in no acute distress. Eyes: Conjunctivae are normal. PERRL. EOMI. Head:  Atraumatic. Nose: No congestion/rhinnorhea. Mouth/Throat: Mucous membranes are moist.  Oropharynx non-erythematous. Hematological/Lymphatic/Immunilogical: No cervical lymphadenopathy. Cardiovascular: Normal rate, regular rhythm. Grossly normal heart sounds.  Good peripheral circulation. Respiratory: Normal respiratory effort.  No retractions. Lungs CTAB. Gastrointestinal: Soft and nontender. No distention. No abdominal bruits. No CVA tenderness. Genitourinary: Deferred Neurologic:  Normal speech and language. No gross focal neurologic deficits are appreciated. No gait instability. Skin:  Skin is warm, dry and intact. No rash noted. Psychiatric: Mood and affect are normal. Speech and behavior are normal.  ____________________________________________   LABS (all labs ordered are listed, but only abnormal results are displayed)  Labs Reviewed  URINALYSIS, COMPLETE (UACMP) WITH MICROSCOPIC - Abnormal; Notable for the following components:      Result Value   Specific Gravity, Urine >1.030 (*)    Glucose, UA 100 (*)    Hgb urine dipstick LARGE (*)    Bilirubin Urine SMALL (*)    Ketones, ur 15 (*)    Protein, ur 100 (*)    Leukocytes,Ua SMALL (*)    Bacteria, UA FEW (*)    All other components within normal limits  HCG, QUANTITATIVE, PREGNANCY - Abnormal; Notable for the following components:   hCG, Beta Chain, Quant, S M (*)    All other components within normal limits  POC URINE PREG, ED - Abnormal; Notable for the following components:   Preg Test, Ur POSITIVE (*)    All other components within normal limits  CBG MONITORING, ED - Abnormal; Notable for the following components:   Glucose-Capillary 152 (*)    All other components within normal limits   ____________________________________________  EKG   ____________________________________________  RADIOLOGY I, [phone], personally viewed and evaluated these images (plain radiographs) as part of my medical  decision making, as well as reviewing the written report by the radiologist.  ED MD interpretation:    Official radiology report(s): Joni Reining OB LESS THAN 14 WEEKS WITH OB TRANSVAGINAL  Result Date: 08/27/2020 CLINICAL DATA:  Vaginal bleeding, unsure of last menstrual. EXAM: OBSTETRIC <14 WK 10/27/2020 AND TRANSVAGINAL OB US TECHNIQUE: Both transabdominal and transvaginal ultrasound examinations were performed for complete evaluation of the gestation as well as the maternal uterus, adnexal regions, and pelvic cul-de-sac. Transvaginal technique was performed to assess early pregnancy. COMPARISON:  None. FINDINGS: Intrauterine gestational sac: Single Yolk sac:  Visualized. Embryo:  Visualized. Cardiac Activity: Visualized. Heart Rate: 173 bpm CRL: 25.2 mm   9 w   2 d                  [place] EDC: March 30, 2021 Subchorionic hemorrhage:  Small volume Maternal uterus/adnexae: Unremarkable. Trace pelvic free fluid, likely physiologic. IMPRESSION: Single viable intrauterine pregnancy with small volume subchorionic hemorrhage, further described above. Electronically Signed  By: Maudry Mayhew MD   On: 08/27/2020 11:54    ____________________________________________   PROCEDURES  Procedure(s) performed (including Critical Care):  Procedures   ____________________________________________   INITIAL IMPRESSION / ASSESSMENT AND PLAN / ED COURSE  As part of my medical decision making, I reviewed the following data within the electronic MEDICAL RECORD NUMBER         Patient presents with 2 weeks of nausea and vomiting.  Patient stated vomiting only occurs in the morning but continued nausea throughout the day.  Patient is able to tolerate food and fluids.  Discussed lab and ultrasound results with patient which are consistent with early gestation.  Patient given discharge care instructions and prescription for Reglan.  Patient advised establish care with a obstetrician.       ____________________________________________   FINAL CLINICAL IMPRESSION(S) / ED DIAGNOSES  Final diagnoses:  Nausea and vomiting during pregnancy     ED Discharge Orders         Ordered    metoCLOPramide (REGLAN) 10 MG tablet  Every 8 hours PRN        08/27/20 1214          *Please note:  Ashley Camacho was evaluated in Emergency Department on 08/27/2020 for the symptoms described in the history of present illness. She was evaluated in the context of the global COVID-19 pandemic, which necessitated consideration that the patient might be at risk for infection with the SARS-CoV-2 virus that causes COVID-19. Institutional protocols and algorithms that pertain to the evaluation of patients at risk for COVID-19 are in a state of rapid change based on information released by regulatory bodies including the CDC and federal and state organizations. These policies and algorithms were followed during the patient's care in the ED.  Some ED evaluations and interventions may be delayed as a result of limited staffing during and the pandemic.*   Note:  This document was prepared using Dragon voice recognition software and may include unintentional dictation errors.    Joni Reining, PA-C 08/27/20 1236    Sharman Cheek, MD 08/29/20 Jerene Bears

## 2020-08-27 NOTE — ED Notes (Signed)
See triage note  Presents with some n/v  States she has been nauseated for the past 2 weeks  Has had 1 episode of vomiting daily  Denies abd pain

## 2020-08-27 NOTE — Discharge Instructions (Signed)
Follow discharge care instruction take medication as directed.  Advised establish care with [facility] department for OB follow-up.

## 2020-09-04 DIAGNOSIS — E1042 Type 1 diabetes mellitus with diabetic polyneuropathy: Secondary | ICD-10-CM | POA: Diagnosis not present

## 2020-09-04 DIAGNOSIS — E1043 Type 1 diabetes mellitus with diabetic autonomic (poly)neuropathy: Secondary | ICD-10-CM | POA: Diagnosis not present

## 2020-09-04 DIAGNOSIS — E109 Type 1 diabetes mellitus without complications: Secondary | ICD-10-CM | POA: Diagnosis not present

## 2020-09-04 DIAGNOSIS — K3184 Gastroparesis: Secondary | ICD-10-CM | POA: Diagnosis not present

## 2020-09-11 ENCOUNTER — Ambulatory Visit (INDEPENDENT_AMBULATORY_CARE_PROVIDER_SITE_OTHER): Payer: Medicaid Other | Admitting: Obstetrics and Gynecology

## 2020-09-11 ENCOUNTER — Encounter: Payer: Self-pay | Admitting: Obstetrics and Gynecology

## 2020-09-11 ENCOUNTER — Other Ambulatory Visit: Payer: Self-pay

## 2020-09-11 VITALS — BP 105/68 | HR 99 | Ht 61.0 in | Wt 118.1 lb

## 2020-09-11 DIAGNOSIS — Z7689 Persons encountering health services in other specified circumstances: Secondary | ICD-10-CM

## 2020-09-11 DIAGNOSIS — E1143 Type 2 diabetes mellitus with diabetic autonomic (poly)neuropathy: Secondary | ICD-10-CM | POA: Diagnosis not present

## 2020-09-11 DIAGNOSIS — O09891 Supervision of other high risk pregnancies, first trimester: Secondary | ICD-10-CM

## 2020-09-11 DIAGNOSIS — K3184 Gastroparesis: Secondary | ICD-10-CM | POA: Diagnosis not present

## 2020-09-11 DIAGNOSIS — E109 Type 1 diabetes mellitus without complications: Secondary | ICD-10-CM | POA: Diagnosis not present

## 2020-09-11 DIAGNOSIS — E162 Hypoglycemia, unspecified: Secondary | ICD-10-CM | POA: Diagnosis not present

## 2020-09-11 DIAGNOSIS — E1165 Type 2 diabetes mellitus with hyperglycemia: Secondary | ICD-10-CM | POA: Diagnosis not present

## 2020-09-11 NOTE — Progress Notes (Signed)
HPI:      [name] is a 30 y.o. G2P0101 who LMP was No LMP recorded (lmp unknown). Patient is pregnant.  Subjective:   She presents today to establish care.  She was seen in the emergency department and found to be pregnant.  She was not attempting pregnancy, but not preventing.  Based on crown-rump length ultrasound in the ED patient currently 11 weeks estimated gestational age. Of significant note, patient is a type I diabetic poorly controlled on insulin. Previous pregnancy was complicated by diabetes -preterm delivery, NICU stay for baby.    Hx: The following portions of the patient's history were reviewed and updated as appropriate:             She  has a past medical history of Diabetes mellitus without complication (HCC), Hypertension, and Type 1 diabetes (HCC). She does not have any pertinent problems on file. She  has a past surgical history that includes Cesarean section and Mouth surgery. Her family history includes CAD in her father, paternal grandfather, and paternal grandmother; Diabetes type I in her father. She  reports that she has never smoked. She has never used smokeless tobacco. She reports that she does not drink alcohol and does not use drugs. She has a current medication list which includes the following prescription(s): insulin aspart, metoclopramide, d3-1000, duloxetine, famotidine, insulin glargine, medroxyprogesterone, and ondansetron. She has No Known Allergies.       Review of Systems:  Review of Systems  Constitutional: Denied constitutional symptoms, night sweats, recent illness, fatigue, fever, insomnia and weight loss.  Eyes: Denied eye symptoms, eye pain, photophobia, vision change and visual disturbance.  Ears/Nose/Throat/Neck: Denied ear, nose, throat or neck symptoms, hearing loss, nasal discharge, sinus congestion and sore throat.  Cardiovascular: Denied cardiovascular symptoms, arrhythmia, chest pain/pressure, edema, exercise intolerance,  orthopnea and palpitations.  Respiratory: Denied pulmonary symptoms, asthma, pleuritic pain, productive sputum, cough, dyspnea and wheezing.  Gastrointestinal: Denied, gastro-esophageal reflux, melena, nausea and vomiting.  Genitourinary: Denied genitourinary symptoms including symptomatic vaginal discharge, pelvic relaxation issues, and urinary complaints.  Musculoskeletal: Denied musculoskeletal symptoms, stiffness, swelling, muscle weakness and myalgia.  Dermatologic: Denied dermatology symptoms, rash and scar.  Neurologic: Denied neurology symptoms, dizziness, headache, neck pain and syncope.  Psychiatric: Denied psychiatric symptoms, anxiety and depression.  Endocrine: Denied endocrine symptoms including hot flashes and night sweats.   Meds:   Current Outpatient Medications on File Prior to Visit  Medication Sig Dispense Refill  . insulin aspart (NOVOLOG) 100 UNIT/ML injection Inject 0-12 Units into the skin 3 (three) times daily before meals. 10 mL 11  . metoCLOPramide (REGLAN) 10 MG tablet Take 2 tablets (20 mg total) by mouth every 8 (eight) hours as needed for nausea. 30 tablet 1  . D3-1000 1000 units tablet Take 2,000 Units by mouth daily. (Patient not taking: Reported on 09/11/2020)  1  . DULoxetine (CYMBALTA) 30 MG capsule Take 30 mg by mouth 2 (two) times daily. (Patient not taking: Reported on 09/11/2020)  2  . famotidine (PEPCID) 20 MG tablet Take 1 tablet (20 mg total) by mouth 2 (two) times daily. (Patient not taking: Reported on 09/11/2020) 60 tablet 0  . insulin glargine (LANTUS) 100 UNIT/ML injection Inject 12 Units into the skin daily.  (Patient not taking: Reported on 09/11/2020)    . medroxyPROGESTERone (DEPO-PROVERA) 150 MG/ML injection Inject 150 mg into the muscle every 3 (three) months. (Patient not taking: Reported on 09/11/2020)    . ondansetron (ZOFRAN ODT) 4 MG disintegrating  tablet Take 1 tablet (4 mg total) by mouth every 8 (eight) hours as needed for nausea or  vomiting. (Patient not taking: Reported on 09/11/2020) 20 tablet 0   No current facility-administered medications on file prior to visit.          Objective:     Vitals:   09/11/20 1019  BP: 105/68  Pulse: 99   Filed Weights   09/11/20 1019  Weight: 118 lb 1.6 oz (53.6 kg)              Ultrasound results reviewed  Assessment:    G2P0101 Patient Active Problem List   Diagnosis Date Noted  . Adjustment disorder with mixed anxiety and depressed mood 05/14/2016  . DKA (diabetic ketoacidoses) 04/03/2016     1. Encounter to establish care   2. Type 1 diabetes mellitus without complication (HCC)   3. History of preterm delivery, currently pregnant in first trimester     Type 1 diabetes poorly controlled.  11 weeks estimated gestational age   Plan:            Prenatal Plan 1.  The patient was given prenatal literature. 2.  She was continued on prenatal vitamins. 3.  A prenatal lab panel to be drawn at nurse visit. 4.  An ultrasound was ordered for anatomy scan with maternal-fetal medicine at 20 weeks.. 5.  A nurse visit was scheduled. 6.  Genetic testing and testing for other inheritable conditions discussed in detail. She will decide in the future whether to have these labs performed. 7.  A general overview of pregnancy testing, visit schedule, ultrasound schedule, and prenatal care was discussed. 8.  COVID and its risks associated with pregnancy, prevention by limiting exposure and use of masks, as well as the risks and benefits of vaccination during pregnancy were discussed in detail.  [facility] policy regarding office and hospital visitation and testing was explained. 9.  Benefits of breast-feeding discussed in detail including both maternal and infant benefits. Ready Set Baby website discussed. 10.  Patient advised to begin 81 mg aspirin at 12 weeks estimated gestational age 43.  Glycemic control and its importance during pregnancy discussed in detail.  Patient has an  appointment with her endocrinologist tomorrow. 12.  Echocardiogram approximately 22 weeks 13.  Consider retinal exam during pregnancy. 14.  Additional third trimester ultrasounds and fetal surveillance discussed.   Orders No orders of the defined types were placed in this encounter.   No orders of the defined types were placed in this encounter.     F/U  Return in about 2 weeks (around 09/25/2020). I spent 35 minutes involved in the care of this patient preparing to see the patient by obtaining and reviewing her medical history (including labs, imaging tests and prior procedures), documenting clinical information in the electronic health record (EHR), counseling and coordinating care plans, writing and sending prescriptions, ordering tests or procedures and directly communicating with the patient by discussing pertinent items from her history and physical exam as well as detailing my assessment and plan as noted above so that she has an informed understanding.  All of her questions were answered.  Elonda Husky, M.D. 09/11/2020 10:53 AM

## 2020-09-18 ENCOUNTER — Other Ambulatory Visit: Payer: Self-pay

## 2020-09-18 ENCOUNTER — Ambulatory Visit (INDEPENDENT_AMBULATORY_CARE_PROVIDER_SITE_OTHER): Payer: Medicaid Other | Admitting: Obstetrics and Gynecology

## 2020-09-18 VITALS — BP 116/72 | HR 101 | Ht 61.0 in | Wt 119.2 lb

## 2020-09-18 DIAGNOSIS — Z3481 Encounter for supervision of other normal pregnancy, first trimester: Secondary | ICD-10-CM | POA: Diagnosis not present

## 2020-09-18 DIAGNOSIS — Z3A12 12 weeks gestation of pregnancy: Secondary | ICD-10-CM

## 2020-09-18 DIAGNOSIS — Z202 Contact with and (suspected) exposure to infections with a predominantly sexual mode of transmission: Secondary | ICD-10-CM | POA: Diagnosis not present

## 2020-09-18 DIAGNOSIS — T7589XA Other specified effects of external causes, initial encounter: Secondary | ICD-10-CM

## 2020-09-18 DIAGNOSIS — O24011 Pre-existing diabetes mellitus, type 1, in pregnancy, first trimester: Secondary | ICD-10-CM | POA: Diagnosis not present

## 2020-09-18 MED ORDER — DOXYLAMINE-PYRIDOXINE 10-10 MG PO TBEC: 10.0000 mg | DELAYED_RELEASE_TABLET | Freq: Every day | ORAL | 1 refills | Status: DC

## 2020-09-18 NOTE — Patient Instructions (Signed)
WHAT OB PATIENTS CAN EXPECT   Confirmation of pregnancy and ultrasound ordered if medically indicated-[redacted] weeks gestation  New OB (NOB) intake with nurse and New OB (NOB) labs- [redacted] weeks gestation  New OB (NOB) physical examination with provider- 11/[redacted] weeks gestation  Flu vaccine-[redacted] weeks gestation  Anatomy scan-[redacted] weeks gestation  Glucose tolerance test, blood work to test for anemia, T-dap vaccine-[redacted] weeks gestation  Vaginal swabs/cultures-STD/Group B strep-[redacted] weeks gestation  Appointments every 4 weeks until 28 weeks  Every 2 weeks from 28 weeks until 36 weeks  Weekly visits from 36 weeks until delivery  Morning Sickness  Morning sickness is when you feel like you may vomit (feel nauseous) during pregnancy. Sometimes, you may vomit. Morning sickness most often happens in the morning, but it can also happen at any time of the day. Some women may have morning sickness that makes them vomit all the time. This is a more serious problem that needs treatment. What are the causes? The cause of this condition is not known. What increases the risk?  You had vomiting or a feeling like you may vomit before your pregnancy.  You had morning sickness in another pregnancy.  You are pregnant with more than one baby, such as twins. What are the signs or symptoms?  Feeling like you may vomit.  Vomiting. How is this treated? Treatment is usually not needed for this condition. You may only need to change what you eat. In some cases, your doctor may give you some things to take for your condition. These include:  Vitamin B6 supplements.  Medicines to treat the feeling that you may vomit.  Ginger. Follow these instructions at home: Medicines  Take over-the-counter and prescription medicines only as told by your doctor. Do not take any medicines until you talk with your doctor about them first.  Take multivitamins before you get pregnant. These can stop or lessen the symptoms of morning  sickness. Eating and drinking  Eat dry toast or crackers before getting out of bed.  Eat 5 or 6 small meals a day.  Eat dry and bland foods like rice and baked potatoes.  Do not eat greasy, fatty, or spicy foods.  Have someone cook for you if the smell of food causes you to vomit or to feel like you may vomit.  If you feel like you may vomit after taking prenatal vitamins, take them at night or with a snack.  Eat protein foods when you need a snack. Nuts, yogurt, and cheese are good choices.  Drink fluids throughout the day.  Try ginger ale made with real ginger, ginger tea made from fresh grated ginger, or ginger candies. General instructions  Do not smoke or use any products that contain nicotine or tobacco. If you need help quitting, ask your doctor.  Use an air purifier to keep the air in your house free of smells.  Get lots of fresh air.  Try to avoid smells that make you feel sick.  Try wearing an acupressure wristband. This is a wristband that is used to treat seasickness.  Try a treatment called acupuncture. In this treatment, a doctor puts needles into certain areas of your body to make you feel better. Contact a doctor if:  You need medicine to feel better.  You feel dizzy or light-headed.  You are losing weight. Get help right away if:  The feeling that you may vomit will not go away, or you cannot stop vomiting.  You faint.  You have  very bad pain in your belly. Summary  Morning sickness is when you feel like you may vomit (feel nauseous) during pregnancy.  You may feel sick in the morning, but you can feel this way at any time of the day.  Making some changes to what you eat may help your symptoms go away. This information is not intended to replace advice given to you by your health care provider. Make sure you discuss any questions you have with your health care provider. Document Revised: 12/24/2019 Document Reviewed: 12/03/2019 Elsevier Patient  Education  2021 [employer]. How a Baby Grows During Pregnancy Pregnancy begins when a female's sperm enters a female's egg. This is called fertilization. Fertilization usually happens in one of the fallopian tubes that connect the ovaries to the uterus. The fertilized egg moves down the fallopian tube to the uterus. Once it reaches the uterus, it implants into the lining of the uterus and begins to grow. For the first 8 weeks, the fertilized egg is called an embryo. After 8 weeks, it is called a fetus. As the fetus continues to grow, it receives oxygen and nutrients through the placenta, which is an organ that grows to support the developing baby. The placenta is the life support system for the baby. It provides oxygen and nutrition and removes waste. How long does a typical pregnancy last? A pregnancy usually lasts 280 days, or about 40 weeks. Pregnancy is divided into three periods of growth, also called trimesters:  First trimester: 0-12 weeks.  Second trimester: 13-27 weeks.  Third trimester: 28-40 weeks. The day when your baby is ready to be born (full term) is your estimated date of delivery. However, most babies are not born on their estimated date of delivery. How does my baby develop month by month? First month  The fertilized egg attaches to the inside of the uterus.  Some cells will form the placenta. Others will form the fetus.  The arms, legs, brain, spinal cord, lungs, and heart begin to develop.  At the end of the first month, the heart begins to beat. Second month  The bones, inner ear, eyelids, hands, and feet form.  The genitals develop.  By the end of 8 weeks, all major organs are developing. Third month  All of the internal organs are forming.  Teeth develop below the gums.  Bones and muscles begin to grow. The spine can flex.  The skin is transparent.  Fingernails and toenails begin to form.  Arms and legs continue to grow longer, and hands and feet  develop.  The fetus is about 3 inches (7.6 cm) long. Fourth month  The placenta is completely formed.  The external sex organs, neck, outer ear, eyebrows, eyelids, and fingernails are formed.  The fetus can hear, swallow, and move its arms and legs.  The kidneys begin to produce urine.  The skin is covered with a white, waxy coating (vernix) and very fine hair (lanugo). Fifth month  The fetus moves around more and can be felt for the first time (quickening).  The fetus starts to sleep and wake up and may begin to suck a finger.  The nails grow to the end of the fingers.  The organ in the digestive system that makes bile (gallbladder) functions and helps to digest nutrients.  If the fetus is a female, eggs are present in the ovaries. If the fetus is a female, testicles start to move down into the scrotum. Sixth month  The lungs are formed.  The eyes open. The brain continues to develop.  Your baby has fingerprints and toe prints. Your baby's hair grows thicker.  At the end of the second trimester, the fetus is about 9 inches (22.9 cm) long. Seventh month  The fetus kicks and stretches.  The eyes are developed enough to sense changes in light.  The hands can make a grasping motion.  The fetus responds to sound. Eighth month  Most organs and body systems are fully developed and functioning.  Bones harden, and taste buds develop. The fetus may hiccup.  Certain areas of the brain are still developing. The skull remains soft. Ninth month  The fetus gains about  lb (0.23 kg) each week.  The lungs are fully developed.  Patterns of sleep develop.  The fetus's head typically moves into a head-down position (vertex) in the uterus to prepare for birth.  The fetus weighs 6-9 lb (2.72-4.08 kg) and is 19-20 inches (48.26-50.8 cm) long.   How do I know if my baby is developing well? Always talk with your health care provider about any concerns that you may have about your  pregnancy and your baby. At each prenatal visit, your health care provider will do several different tests to check on your health and keep track of your baby's development. These include:  Fundal height and position. To do this, your health care provider will: ? Measure your growing belly from your pubic bone to the top of the uterus using a tape measure. ? Feel your belly to determine your baby's position.  Heartbeat. An ultrasound in the first trimester can confirm pregnancy and show a heartbeat, depending on how far along you are. Your health care provider will check your baby's heart rate at every prenatal visit. You will also have a second trimester ultrasound to check your baby's development. Follow these instructions at home:  Take prenatal vitamins as told by your health care provider. These include vitamins such as folic acid, iron, calcium, and vitamin D. They are important for healthy development.  Take over-the-counter and prescription medicines only as told by your health care provider.  Keep all follow-up visits. This is important. Follow-up visits include prenatal care and screening tests. Summary  A pregnancy usually lasts 280 days, or about 40 weeks. Pregnancy is divided into three periods of growth, also called trimesters.  Your health care provider will monitor your baby's growth and development throughout your pregnancy.  Follow your health care provider's recommendations about taking prenatal vitamins and medicines during your pregnancy.  Talk with your health care provider if you have any concerns about your pregnancy or your developing baby. This information is not intended to replace advice given to you by your health care provider. Make sure you discuss any questions you have with your health care provider. Document Revised: 10/17/2019 Document Reviewed: 08/23/2019 Elsevier Patient Education  [place]. Obstetrics: Normal and Problem Pregnancies (7th ed.,  pp. 102-121). [place], [place]: Elsevier."> Textbook of Family Medicine (9th ed., pp. [id number]). [place], [place]: Elsevier [employer].">  First Trimester of Pregnancy  The first trimester of pregnancy starts on the first day of your last menstrual period until the end of week 12. This is months 1 through 3 of pregnancy. A week after a sperm fertilizes an egg, the egg will implant into the wall of the uterus and begin to develop into a baby. By the end of 12 weeks, all the baby's organs will be formed and the baby will be 2-3 inches in  size. Body changes during your first trimester Your body goes through many changes during pregnancy. The changes vary and generally return to normal after your baby is born. Physical changes  You may gain or lose weight.  Your breasts may begin to grow larger and become tender. The tissue that surrounds your nipples (areola) may become darker.  Dark spots or blotches (chloasma or mask of pregnancy) may develop on your face.  You may have changes in your hair. These can include thickening or thinning of your hair or changes in texture. Health changes  You may feel nauseous, and you may vomit.  You may have heartburn.  You may develop headaches.  You may develop constipation.  Your gums may bleed and may be sensitive to brushing and flossing. Other changes  You may tire easily.  You may urinate more often.  Your menstrual periods will stop.  You may have a loss of appetite.  You may develop cravings for certain kinds of food.  You may have changes in your emotions from day to day.  You may have more vivid and strange dreams. Follow these instructions at home: Medicines  Follow your health care provider's instructions regarding medicine use. Specific medicines may be either safe or unsafe to take during pregnancy. Do not take any medicines unless told to by your health care provider.  Take a prenatal vitamin that contains at least 600 micrograms  (mcg) of folic acid. Eating and drinking  Eat a healthy diet that includes fresh fruits and vegetables, whole grains, good sources of protein such as meat, eggs, or tofu, and low-fat dairy products.  Avoid raw meat and unpasteurized juice, milk, and cheese. These carry germs that can harm you and your baby.  If you feel nauseous or you vomit: ? Eat 4 or 5 small meals a day instead of 3 large meals. ? Try eating a few soda crackers. ? Drink liquids between meals instead of during meals.  You may need to take these actions to prevent or treat constipation: ? Drink enough fluid to keep your urine pale yellow. ? Eat foods that are high in fiber, such as beans, whole grains, and fresh fruits and vegetables. ? Limit foods that are high in fat and processed sugars, such as fried or sweet foods. Activity  Exercise only as directed by your health care provider. Most people can continue their usual exercise routine during pregnancy. Try to exercise for 30 minutes at least 5 days a week.  Stop exercising if you develop pain or cramping in the lower abdomen or lower back.  Avoid exercising if it is very hot or humid or if you are at high altitude.  Avoid heavy lifting.  If you choose to, you may have sex unless your health care provider tells you not to. Relieving pain and discomfort  Wear a good support bra to relieve breast tenderness.  Rest with your legs elevated if you have leg cramps or low back pain.  If you develop bulging veins (varicose veins) in your legs: ? Wear support hose as told by your health care provider. ? Elevate your feet for 15 minutes, 3-4 times a day. ? Limit salt in your diet. Safety  Wear your seat belt at all times when driving or riding in a car.  Talk with your health care provider if someone is verbally or physically abusive to you.  Talk with your health care provider if you are feeling sad or have thoughts of hurting yourself.  Lifestyle  Do not use  hot tubs, steam rooms, or saunas.  Do not douche. Do not use tampons or scented sanitary pads.  Do not use herbal remedies, alcohol, illegal drugs, or medicines that are not approved by your health care provider. Chemicals in these products can harm your baby.  Do not use any products that contain nicotine or tobacco, such as cigarettes, e-cigarettes, and chewing tobacco. If you need help quitting, ask your health care provider.  Avoid cat litter boxes and soil used by cats. These carry germs that can cause birth defects in the baby and possibly loss of the unborn baby (fetus) by miscarriage or stillbirth. General instructions  During routine prenatal visits in the first trimester, your health care provider will do a physical exam, perform necessary tests, and ask you how things are going. Keep all follow-up visits. This is important.  Ask for help if you have counseling or nutritional needs during pregnancy. Your health care provider can offer advice or refer you to specialists for help with various needs.  Schedule a dentist appointment. At home, brush your teeth with a soft toothbrush. Floss gently.  Write down your questions. Take them to your prenatal visits. Where to find more information  American Pregnancy Association: americanpregnancy.[place] and Gynecologists: PoolDevices.com.pt  Office on [employer]: KeywordPortfolios.com.br Contact a health care provider if you have:  Dizziness.  A fever.  Mild pelvic cramps, pelvic pressure, or nagging pain in the abdominal area.  Nausea, vomiting, or diarrhea that lasts for 24 hours or longer.  A bad-smelling vaginal discharge.  Pain when you urinate.  Known exposure to a contagious illness, such as chickenpox, measles, Zika virus, HIV, or hepatitis. Get help right away if you have:  Spotting or bleeding from your vagina.  Severe abdominal cramping or pain.  Shortness  of breath or chest pain.  Any kind of trauma, such as from a fall or a car crash.  New or increased pain, swelling, or redness in an arm or leg. Summary  The first trimester of pregnancy starts on the first day of your last menstrual period until the end of week 12 (months 1 through 3).  Eating 4 or 5 small meals a day rather than 3 large meals may help to relieve nausea and vomiting.  Do not use any products that contain nicotine or tobacco, such as cigarettes, e-cigarettes, and chewing tobacco. If you need help quitting, ask your health care provider.  Keep all follow-up visits. This is important. This information is not intended to replace advice given to you by your health care provider. Make sure you discuss any questions you have with your health care provider. Document Revised: 10/17/2019 Document Reviewed: 08/23/2019 Elsevier Patient Education  2021 [employer]. Commonly Asked Questions During Pregnancy  Cats: A parasite can be excreted in cat feces.  To avoid exposure you need to have another person empty the little box.  If you must empty the litter box you will need to wear gloves.  Wash your hands after handling your cat.  This parasite can also be found in raw or undercooked meat so this should also be avoided.  Colds, Sore Throats, Flu: Please check your medication sheet to see what you can take for symptoms.  If your symptoms are unrelieved by these medications please call the office.  Dental Work: Most any dental work Investment banker, corporate recommends is permitted.  X-rays should only be taken during the first trimester if absolutely  necessary.  Your abdomen should be shielded with a lead apron during all x-rays.  Please notify your provider prior to receiving any x-rays.  Novocaine is fine; gas is not recommended.  If your dentist requires a note from [place] prior to dental work please call the office and we will provide one for you.  Exercise: Exercise is an important part of staying  healthy during your pregnancy.  You may continue most exercises you were accustomed to prior to pregnancy.  Later in your pregnancy you will most likely notice you have difficulty with activities requiring balance like riding a bicycle.  It is important that you listen to your body and avoid activities that put you at a higher risk of falling.  Adequate rest and staying well hydrated are a must!  If you have questions about the safety of specific activities ask your provider.    Exposure to Children with illness: Try to avoid obvious exposure; report any symptoms to [place] when noted,  If you have chicken pos, red measles or mumps, you should be immune to these diseases.   Please do not take any vaccines while pregnant unless you have checked with your OB provider.  Fetal Movement: After 28 weeks we recommend you do "kick counts" twice daily.  Lie or sit down in a calm quiet environment and count your baby movements "kicks".  You should feel your baby at least 10 times per hour.  If you have not felt 10 kicks within the first hour get up, walk around and have something sweet to eat or drink then repeat for an additional hour.  If count remains less than 10 per hour notify your provider.  Fumigating: Follow your pest control agent's advice as to how long to stay out of your home.  Ventilate the area well before re-entering.  Hemorrhoids:   Most over-the-counter preparations can be used during pregnancy.  Check your medication to see what is safe to use.  It is important to use a stool softener or fiber in your diet and to drink lots of liquids.  If hemorrhoids seem to be getting worse please call the office.   Hot Tubs:  Hot tubs Jacuzzis and saunas are not recommended while pregnant.  These increase your internal body temperature and should be avoided.  Intercourse:  Sexual intercourse is safe during pregnancy as long as you are comfortable, unless otherwise advised by your provider.  Spotting may occur  after intercourse; report any bright red bleeding that is heavier than spotting.  Labor:  If you know that you are in labor, please go to the hospital.  If you are unsure, please call the office and let us help you decide what to do.  Lifting, straining, etc:  If your job requires heavy lifting or straining please check with your provider for any limitations.  Generally, you should not lift items heavier than that you can lift simply with your hands and arms (no back muscles)  Painting:  Paint fumes do not harm your pregnancy, but may make you ill and should be avoided if possible.  Latex or water based paints have less odor than oils.  Use adequate ventilation while painting.  Permanents & Hair Color:  Chemicals in hair dyes are not recommended as they cause increase hair dryness which can increase hair loss during pregnancy.  " Highlighting" and permanents are allowed.  Dye may be absorbed differently and permanents may not hold as well during pregnancy.  Sunbathing:  Use  a sunscreen, as skin burns easily during pregnancy.  Drink plenty of fluids; avoid over heating.  Tanning Beds:  Because their possible side effects are still unknown, tanning beds are not recommended.  Ultrasound Scans:  Routine ultrasounds are performed at approximately 20 weeks.  You will be able to see your baby's general anatomy an if you would like to know the gender this can usually be determined as well.  If it is questionable when you conceived you may also receive an ultrasound early in your pregnancy for dating purposes.  Otherwise ultrasound exams are not routinely performed unless there is a medical necessity.  Although you can request a scan we ask that you pay for it when conducted because insurance does not cover " patient request" scans.  Work: If your pregnancy proceeds without complications you may work until your due date, unless your physician or employer advises otherwise.  Round Ligament Pain/Pelvic  Discomfort:  Sharp, shooting pains not associated with bleeding are fairly common, usually occurring in the second trimester of pregnancy.  They tend to be worse when standing up or when you remain standing for long periods of time.  These are the result of pressure of certain pelvic ligaments called "round ligaments".  Rest, Tylenol and heat seem to be the most effective relief.  As the womb and fetus grow, they rise out of the pelvis and the discomfort improves.  Please notify the office if your pain seems different than that described.  It may represent a more serious condition.  Common Medications Safe in Pregnancy  Acne:      Constipation:  Benzoyl Peroxide     Colace  Clindamycin      Dulcolax Suppository  Topica Erythromycin     Fibercon  Salicylic Acid      Metamucil         Miralax AVOID:        Senakot   Accutane    Cough:  Retin-A       Cough Drops  Tetracycline      Phenergan w/ Codeine if Rx  Minocycline      Robitussin (Plain & DM)  Antibiotics:     Crabs/Lice:  Ceclor       RID  Cephalosporins    AVOID:  E-Mycins      Kwell  Keflex  Macrobid/Macrodantin   Diarrhea:  Penicillin      Kao-Pectate  Zithromax      Imodium AD         PUSH FLUIDS AVOID:       Cipro     Fever:  Tetracycline      Tylenol (Regular or Extra  Minocycline       Strength)  Levaquin      Extra Strength-Do not          Exceed 8 tabs/24 hrs Caffeine:        <274m/day (equiv. To 1 cup of coffee or  approx. 3 12 oz sodas)         Gas: Cold/Hayfever:       Gas-X  Benadryl      Mylicon  Claritin       Phazyme  **Claritin-D        Chlor-Trimeton    Headaches:  Dimetapp      ASA-Free Excedrin  Drixoral-Non-Drowsy     Cold Compress  Mucinex (Guaifenasin)     Tylenol (Regular or Extra  Sudafed/Sudafed-12 Hour     Strength)  **Sudafed PE Pseudoephedrine  Tylenol Cold & Sinus     Vicks Vapor Rub  Zyrtec  **AVOID if Problems With Blood Pressure         Heartburn: Avoid lying down for at  least 1 hour after meals  Aciphex      Maalox     Rash:  Milk of Magnesia     Benadryl    Mylanta       1% Hydrocortisone Cream  Pepcid  Pepcid Complete   Sleep Aids:  Prevacid      Ambien   Prilosec       Benadryl  Rolaids       Chamomile Tea  Tums (Limit 4/day)     Unisom         Tylenol PM         Warm milk-add vanilla or  Hemorrhoids:       Sugar for taste  Anusol/Anusol H.C.  (RX: Analapram 2.5%)  Sugar Substitutes:  Hydrocortisone OTC     Ok in moderation  Preparation H      Tucks        Vaseline lotion applied to tissue with wiping    Herpes:     Throat:  Acyclovir      Oragel  Famvir  Valtrex     Vaccines:         Flu Shot Leg Cramps:       *Gardasil  Benadryl      Hepatitis A         Hepatitis B Nasal Spray:       Pneumovax  Saline Nasal Spray     Polio Booster         Tetanus Nausea:       Tuberculosis test or PPD  Vitamin B6 25 mg TID   AVOID:    Dramamine      *Gardasil  Emetrol       Live Poliovirus  Ginger Root 250 mg QID    MMR (measles, mumps &  High Complex Carbs @ Bedtime    rebella)  Sea Bands-Accupressure    Varicella (Chickenpox)  Unisom 1/2 tab TID     *No known complications           If received before Pain:         Known pregnancy;   Darvocet       Resume series after  Lortab        Delivery  Percocet    Yeast:   Tramadol      Femstat  Tylenol 3      Gyne-lotrimin  Ultram       Monistat  Vicodin           MISC:         All Sunscreens           Hair Coloring/highlights          Insect Repellant's          (Including DEET)         [facility]  [place], [place], [place] [address]  Phone: [phone]   [facility] (second location)  [address] [place], [place] [address]  Phone: [phone]   [facility]) [place], [place], [place] [address] Phone: [phone]   [facility] [place],   [address]  Phone: [phone]

## 2020-09-18 NOTE — Progress Notes (Signed)
Ashley Camacho presents for NOB nurse interview visit. Pregnancy confirmation done ___At ED_on 4/6/2022_.  G-2 .  P-0101. Scan 4/6 shows [facility] 9 2/7 edd 03/30/2021. Pregnancy education material explained and given. _Pos for _ cats in the home. Toxo ordered. Pt advised to not change the litter box. NOB labs ordered. HIV labs and Drug screen were explained and  ordered. PNV encouraged. Genetic screening options discussed. Genetic testing: Ordered per pt request. Pt having nausea mostly. Vomit x 2 qd. Diclegis erx. N/V protocol given.  Urine sample not adequate. Need gc/ch and urine culture at NOB PE. Per [name] she was unable to get her b/w. JW aware. Pt. To follow up with provider in _1_ week for NOB physical.  All questions answered.

## 2020-09-19 LAB — URINALYSIS, ROUTINE W REFLEX MICROSCOPIC
Bilirubin, UA: NEGATIVE
Glucose, UA: NEGATIVE
Ketones, UA: NEGATIVE
Leukocytes,UA: NEGATIVE
Nitrite, UA: NEGATIVE
Specific Gravity, UA: 1.02 (ref 1.005–1.030)
Urobilinogen, Ur: 0.2 mg/dL (ref 0.2–1.0)
pH, UA: 5.5 (ref 5.0–7.5)

## 2020-09-19 LAB — MICROSCOPIC EXAMINATION: Bacteria, UA: NONE SEEN

## 2020-09-25 ENCOUNTER — Other Ambulatory Visit: Payer: Self-pay

## 2020-09-25 ENCOUNTER — Ambulatory Visit (INDEPENDENT_AMBULATORY_CARE_PROVIDER_SITE_OTHER): Payer: Medicaid Other | Admitting: Obstetrics and Gynecology

## 2020-09-25 ENCOUNTER — Other Ambulatory Visit: Payer: Self-pay | Admitting: Obstetrics and Gynecology

## 2020-09-25 ENCOUNTER — Encounter: Payer: Self-pay | Admitting: Obstetrics and Gynecology

## 2020-09-25 ENCOUNTER — Other Ambulatory Visit (HOSPITAL_COMMUNITY)
Admission: RE | Admit: 2020-09-25 | Discharge: 2020-09-25 | Disposition: A | Discharge status: Home / Self Care | Payer: Medicaid Other | Source: Ambulatory Visit | Attending: Obstetrics and Gynecology | Admitting: Obstetrics and Gynecology

## 2020-09-25 VITALS — BP 102/67 | HR 99 | Wt 121.0 lb

## 2020-09-25 DIAGNOSIS — Z124 Encounter for screening for malignant neoplasm of cervix: Secondary | ICD-10-CM

## 2020-09-25 DIAGNOSIS — Z3481 Encounter for supervision of other normal pregnancy, first trimester: Secondary | ICD-10-CM | POA: Diagnosis not present

## 2020-09-25 DIAGNOSIS — E109 Type 1 diabetes mellitus without complications: Secondary | ICD-10-CM

## 2020-09-25 DIAGNOSIS — Z3A13 13 weeks gestation of pregnancy: Secondary | ICD-10-CM

## 2020-09-25 DIAGNOSIS — O09891 Supervision of other high risk pregnancies, first trimester: Secondary | ICD-10-CM

## 2020-09-25 LAB — POCT URINALYSIS DIPSTICK OB
Bilirubin, UA: NEGATIVE
Glucose, UA: NEGATIVE
Ketones, UA: NEGATIVE
Leukocytes, UA: NEGATIVE
Nitrite, UA: NEGATIVE
Spec Grav, UA: 1.015 (ref 1.010–1.025)
Urobilinogen, UA: 0.2 E.U./dL
pH, UA: 6.5 (ref 5.0–8.0)

## 2020-09-25 LAB — HM PAP SMEAR: HM Pap smear: NEGATIVE

## 2020-09-25 LAB — OB RESULTS CONSOLE GC/CHLAMYDIA: Chlamydia: NEGATIVE

## 2020-09-25 NOTE — Addendum Note (Signed)
Addended by: Dorian Pod on: 09/25/2020 01:15 PM   Modules accepted: Orders

## 2020-09-25 NOTE — Progress Notes (Signed)
NOB: Has occasional nausea but rare vomiting.  This does make it difficult to maintain her sugars with the NovoLog.  She has been noting some occasional lows and highs.  She does have an appointment with her endocrinologist today and she understands that we are striving for tight control during pregnancy if possible.  Patient has begun daily aspirin as recommended.  Lab work today.  aFP next visit.  FAS scheduled.  Physical examination General NAD, Conversant  HEENT Atraumatic; Op clear with mmm.  Normo-cephalic. Pupils reactive. Anicteric sclerae  Thyroid/Neck Smooth without nodularity or enlargement. Normal ROM.  Neck Supple.  Skin No rashes, lesions or ulceration. Normal palpated skin turgor. No nodularity.  Breasts: No masses or discharge.  Symmetric.  No axillary adenopathy.  Lungs: Clear to auscultation.No rales or wheezes. Normal Respiratory effort, no retractions.  Heart: NSR.  No murmurs or rubs appreciated. No periferal edema  Abdomen: Soft.  Non-tender.  No masses.  No HSM. No hernia  Extremities: Moves all appropriately.  Normal ROM for age. No lymphadenopathy.  Neuro: Oriented to PPT.  Normal mood. Normal affect.     Pelvic:   Vulva: Normal appearance.  No lesions.  Vagina: No lesions or abnormalities noted.  Support: Normal pelvic support.  Urethra No masses tenderness or scarring.  Meatus Normal size without lesions or prolapse.  Cervix: Normal appearance.  No lesions.  Anus: Normal exam.  No lesions.  Perineum: Normal exam.  No lesions.        Bimanual   Adnexae: No masses.  Non-tender to palpation.  Uterus: Enlarged. 12 wks - POS FHTs 145  Non-tender.  Mobile.  AV.  Adnexae: No masses.  Non-tender to palpation.  Cul-de-sac: Negative for abnormality.  Adnexae: No masses.  Non-tender to palpation.         Pelvimetry   Diagonal: Reached.  Spines: Average.  Sacrum: Concave.  Pubic Arch: Normal.

## 2020-09-25 NOTE — Addendum Note (Signed)
Addended by: Dorian Pod on: 09/25/2020 01:04 PM   Modules accepted: Orders

## 2020-09-25 NOTE — Addendum Note (Signed)
Addended by: Dorian Pod on: 09/25/2020 01:56 PM   Modules accepted: Orders

## 2020-09-26 LAB — HIV ANTIBODY (ROUTINE TESTING W REFLEX): HIV Screen 4th Generation wRfx: NONREACTIVE

## 2020-09-27 LAB — MONITOR DRUG PROFILE 14(MW)
Amphetamine Scrn, Ur: NEGATIVE ng/mL
BARBITURATE SCREEN URINE: NEGATIVE ng/mL
BENZODIAZEPINE SCREEN, URINE: NEGATIVE ng/mL
Buprenorphine, Urine: NEGATIVE ng/mL
CANNABINOIDS UR QL SCN: NEGATIVE ng/mL
Cocaine (Metab) Scrn, Ur: NEGATIVE ng/mL
Creatinine(Crt), U: 173.2 mg/dL (ref 20.0–300.0)
Fentanyl, Urine: NEGATIVE pg/mL
Meperidine Screen, Urine: NEGATIVE ng/mL
Methadone Screen, Urine: NEGATIVE ng/mL
OXYCODONE+OXYMORPHONE UR QL SCN: NEGATIVE ng/mL
Opiate Scrn, Ur: NEGATIVE ng/mL
Ph of Urine: 5 (ref 4.5–8.9)
Phencyclidine Qn, Ur: NEGATIVE ng/mL
Propoxyphene Scrn, Ur: NEGATIVE ng/mL
SPECIFIC GRAVITY: 1.017
Tramadol Screen, Urine: NEGATIVE ng/mL

## 2020-09-27 LAB — NICOTINE SCREEN, URINE: Cotinine Ql Scrn, Ur: NEGATIVE ng/mL

## 2020-09-27 LAB — TOXOPLASMA ANTIBODIES- IGG AND  IGM
Toxoplasma Antibody- IgM: <3 AU/mL (ref 0.0–7.9)
Toxoplasma IgG Ratio: <3 IU/mL (ref 0.0–7.1)

## 2020-09-27 LAB — RPR: RPR Ser Ql: NONREACTIVE

## 2020-09-27 LAB — RUBELLA SCREEN: Rubella Antibodies, IGG: 1.11 index (ref >0.99)

## 2020-09-27 LAB — CULTURE, OB URINE

## 2020-09-27 LAB — URINE CULTURE, OB REFLEX

## 2020-09-27 LAB — ABO AND RH: Rh Factor: POSITIVE

## 2020-09-27 LAB — VARICELLA ZOSTER ANTIBODY, IGG: Varicella zoster IgG: 248 index (ref >165)

## 2020-09-28 ENCOUNTER — Emergency Department
Admission: EM | Admit: 2020-09-28 | Discharge: 2020-09-28 | Disposition: A | Discharge status: Home / Self Care | Payer: Medicaid Other | Attending: Emergency Medicine | Admitting: Emergency Medicine

## 2020-09-28 DIAGNOSIS — E10649 Type 1 diabetes mellitus with hypoglycemia without coma: Secondary | ICD-10-CM | POA: Insufficient documentation

## 2020-09-28 DIAGNOSIS — R9431 Abnormal electrocardiogram [ECG] [EKG]: Secondary | ICD-10-CM | POA: Diagnosis not present

## 2020-09-28 DIAGNOSIS — E162 Hypoglycemia, unspecified: Secondary | ICD-10-CM

## 2020-09-28 DIAGNOSIS — Z3A13 13 weeks gestation of pregnancy: Secondary | ICD-10-CM | POA: Diagnosis not present

## 2020-09-28 DIAGNOSIS — O99810 Abnormal glucose complicating pregnancy: Secondary | ICD-10-CM | POA: Insufficient documentation

## 2020-09-28 DIAGNOSIS — I10 Essential (primary) hypertension: Secondary | ICD-10-CM | POA: Diagnosis not present

## 2020-09-28 DIAGNOSIS — Z7982 Long term (current) use of aspirin: Secondary | ICD-10-CM | POA: Insufficient documentation

## 2020-09-28 DIAGNOSIS — E161 Other hypoglycemia: Secondary | ICD-10-CM | POA: Diagnosis not present

## 2020-09-28 DIAGNOSIS — R0902 Hypoxemia: Secondary | ICD-10-CM | POA: Diagnosis not present

## 2020-09-28 DIAGNOSIS — O219 Vomiting of pregnancy, unspecified: Secondary | ICD-10-CM | POA: Diagnosis present

## 2020-09-28 DIAGNOSIS — R404 Transient alteration of awareness: Secondary | ICD-10-CM | POA: Diagnosis not present

## 2020-09-28 DIAGNOSIS — E1010 Type 1 diabetes mellitus with ketoacidosis without coma: Secondary | ICD-10-CM | POA: Diagnosis not present

## 2020-09-28 DIAGNOSIS — R4182 Altered mental status, unspecified: Secondary | ICD-10-CM | POA: Diagnosis not present

## 2020-09-28 DIAGNOSIS — E11649 Type 2 diabetes mellitus with hypoglycemia without coma: Secondary | ICD-10-CM | POA: Diagnosis not present

## 2020-09-28 LAB — URINE DRUG SCREEN, QUALITATIVE (ARMC ONLY)
Amphetamines, Ur Screen: NOT DETECTED
Barbiturates, Ur Screen: NOT DETECTED
Benzodiazepine, Ur Scrn: NOT DETECTED
Cannabinoid 50 Ng, Ur ~~LOC~~: NOT DETECTED
Cocaine Metabolite,Ur ~~LOC~~: NOT DETECTED
MDMA (Ecstasy)Ur Screen: NOT DETECTED
Methadone Scn, Ur: NOT DETECTED
Opiate, Ur Screen: NOT DETECTED
Phencyclidine (PCP) Ur S: NOT DETECTED
Tricyclic, Ur Screen: NOT DETECTED

## 2020-09-28 LAB — CBC WITH DIFFERENTIAL/PLATELET
Abs Immature Granulocytes: 0.01 10*3/uL (ref 0.00–0.07)
Basophils Absolute: 0 10*3/uL (ref 0.0–0.1)
Basophils Relative: 1 %
Eosinophils Absolute: 0.1 10*3/uL (ref 0.0–0.5)
Eosinophils Relative: 3 %
HCT: 21.4 % — ABNORMAL LOW (ref 36.0–46.0)
Hemoglobin: 7.2 g/dL — ABNORMAL LOW (ref 12.0–15.0)
Immature Granulocytes: 0 %
Lymphocytes Relative: 25 %
Lymphs Abs: 1.2 10*3/uL (ref 0.7–4.0)
MCH: 28.3 pg (ref 26.0–34.0)
MCHC: 33.6 g/dL (ref 30.0–36.0)
MCV: 84.3 fL (ref 80.0–100.0)
Monocytes Absolute: 0.2 10*3/uL (ref 0.1–1.0)
Monocytes Relative: 5 %
Neutro Abs: 3.2 10*3/uL (ref 1.7–7.7)
Neutrophils Relative %: 66 %
Platelets: 200 10*3/uL (ref 150–400)
RBC: 2.54 MIL/uL — ABNORMAL LOW (ref 3.87–5.11)
RDW: 12.7 % (ref 11.5–15.5)
WBC: 4.9 10*3/uL (ref 4.0–10.5)
nRBC: 0 % (ref 0.0–0.2)

## 2020-09-28 LAB — COMPREHENSIVE METABOLIC PANEL
ALT: 9 U/L (ref 0–44)
AST: 17 U/L (ref 15–41)
Albumin: 3.4 g/dL — ABNORMAL LOW (ref 3.5–5.0)
Alkaline Phosphatase: 44 U/L (ref 38–126)
Anion gap: 8 (ref 5–15)
BUN: 11 mg/dL (ref 6–20)
CO2: 22 mmol/L (ref 22–32)
Calcium: 9 mg/dL (ref 8.9–10.3)
Chloride: 106 mmol/L (ref 98–111)
Creatinine, Ser: 0.76 mg/dL (ref 0.44–1.00)
GFR, Estimated: >60 mL/min (ref >60)
Glucose, Bld: 64 mg/dL — ABNORMAL LOW (ref 70–99)
Potassium: 3.7 mmol/L (ref 3.5–5.1)
Sodium: 136 mmol/L (ref 135–145)
Total Bilirubin: 0.5 mg/dL (ref 0.3–1.2)
Total Protein: 7.1 g/dL (ref 6.5–8.1)

## 2020-09-28 LAB — ETHANOL: Alcohol, Ethyl (B): <10 mg/dL (ref <10)

## 2020-09-28 LAB — URINALYSIS, COMPLETE (UACMP) WITH MICROSCOPIC
Bilirubin Urine: NEGATIVE
Glucose, UA: 50 mg/dL — AB
Ketones, ur: NEGATIVE mg/dL
Nitrite: NEGATIVE
Protein, ur: 30 mg/dL — AB
Specific Gravity, Urine: 1.012 (ref 1.005–1.030)
pH: 5 (ref 5.0–8.0)

## 2020-09-28 LAB — LIPASE, BLOOD: Lipase: 30 U/L (ref 11–51)

## 2020-09-28 LAB — TROPONIN I (HIGH SENSITIVITY): Troponin I (High Sensitivity): <2 ng/L (ref <18)

## 2020-09-28 LAB — CBG MONITORING, ED
Glucose-Capillary: 239 mg/dL — ABNORMAL HIGH (ref 70–99)
Glucose-Capillary: 231 mg/dL — ABNORMAL HIGH (ref 70–99)

## 2020-09-28 MED ORDER — ONDANSETRON 4 MG PO TBDP: 4.0000 mg | ORAL_TABLET | Freq: Three times a day (TID) | ORAL | 0 refills | Status: DC | PRN

## 2020-09-28 MED ORDER — DEXTROSE 50 % IV SOLN
1.0000 | Freq: Once | INTRAVENOUS | Status: AC
  Administered 2020-09-28: 50 mL via INTRAVENOUS
  Filled 2020-09-28: qty 50

## 2020-09-28 MED ORDER — GLUCOSE 40 % PO GEL
1.0000 | Freq: Once | ORAL | Status: AC
  Administered 2020-09-28: 31 g via ORAL
  Filled 2020-09-28: qty 1

## 2020-09-28 MED ORDER — SODIUM CHLORIDE 0.9 % IV SOLN
1.0000 g | Freq: Once | INTRAVENOUS | Status: AC
  Administered 2020-09-28: 1 g via INTRAVENOUS
  Filled 2020-09-28: qty 10

## 2020-09-28 NOTE — ED Provider Notes (Signed)
[facility] Emergency Department Provider Note   ____________________________________________   Event Date/Time   First MD Initiated Contact with Patient 09/28/20 1258     (approximate)  I have reviewed the triage vital signs and the nursing notes.   HISTORY  Chief Complaint Altered Mental Status (Nausea, vomiting , altered dm )    HPI Ashley Camacho is a 30 y.o. female with the below stated past medical history and currently pregnant at [redacted] weeks gestation who presents via EMS after they were called for patient being altered.  They found patient's sugar to be in the 30s but were unable to give any glucose prior to arrival given that they could not find an IV line.  They state patient was moving extremities spontaneously but not purposefully and began vomiting upon arrival to the emergency department.  Further history and review of systems are unable to be assessed at this time given patient's mental status         Past Medical History:  Diagnosis Date  . Diabetes mellitus without complication (HCC)   . Gastroparesis   . Hypertension   . Type 1 diabetes [facility])     Patient Active Problem List   Diagnosis Date Noted  . Adjustment disorder with mixed anxiety and depressed mood 05/14/2016  . DKA (diabetic ketoacidoses) 04/03/2016    Past Surgical History:  Procedure Laterality Date  . CESAREAN SECTION    . MOUTH SURGERY      Prior to Admission medications   Medication Sig Start Date End Date Taking? Authorizing Provider  ondansetron (ZOFRAN ODT) 4 MG disintegrating tablet Take 1 tablet (4 mg total) by mouth every 8 (eight) hours as needed for nausea or vomiting. 09/28/20  Yes Merwyn Katos, MD  aspirin EC 81 MG tablet Take 81 mg by mouth daily. Swallow whole.    [provider]  Doxylamine-Pyridoxine 10-10 MG TBEC Take 10 mg by mouth daily. 2 at bedtime, 1 mid morning, 1 mid afternoon- no more than 4 qd 09/18/20   Linzie Collin, MD   insulin aspart (NOVOLOG) 100 UNIT/ML injection Inject 0-12 Units into the skin 3 (three) times daily before meals. 11/29/17   Houston Siren, MD  Prenatal Vit-Fe Fumarate-FA (PRENATAL MULTIVITAMIN) TABS tablet Take 1 tablet by mouth daily at 12 noon.    [provider]    Allergies Patient has no known allergies.  Family History  Problem Relation Age of Onset  . Diabetes type I Father   . CAD Father   . CAD Paternal Grandmother   . CAD Paternal Grandfather   . Breast cancer Mother   . Ovarian cancer Neg Hx     Social History Social History   Tobacco Use  . Smoking status: Never Smoker  . Smokeless tobacco: Never Used  Vaping Use  . Vaping Use: Never used  Substance Use Topics  . Alcohol use: No  . Drug use: No    Review of Systems Unable to assess ____________________________________________   PHYSICAL EXAM:  VITAL SIGNS: ED Triage Vitals  Enc Vitals Group     BP 09/28/20 1240 121/60     Pulse Rate 09/28/20 1240 85     Resp 09/28/20 1240 17     Temp 09/28/20 1240 98.3 F (36.8 C)     Temp Source 09/28/20 1240 Oral     SpO2 09/28/20 1240 98 %     Weight --      Height --  Head Circumference --      Peak Flow --      Pain Score 09/28/20 1242 0     Pain Loc --      Pain Edu? --      Excl. in GC? --    Constitutional: Laying on her side in bed actively burping with eyes open but not responding. Eyes: Conjunctivae are normal. PERRL. Head: Atraumatic. Nose: No congestion/rhinnorhea. Mouth/Throat: Mucous membranes are moist. Neck: No stridor Cardiovascular: Grossly normal heart sounds.  Good peripheral circulation. Respiratory: Normal respiratory effort.  No retractions. Gastrointestinal: Soft and nontender. No distention. Genitourinary: Normal external female genitalia without lesions Musculoskeletal: No obvious deformities Neurologic: GCS 4 Skin:  Skin is warm and dry. No rash noted. ____________________________________________    LABS (all labs ordered are listed, but only abnormal results are displayed)  Labs Reviewed  COMPREHENSIVE METABOLIC PANEL - Abnormal; Notable for the following components:      Result Value   Glucose, Bld 64 (*)    Albumin 3.4 (*)    All other components within normal limits  CBC WITH DIFFERENTIAL/PLATELET - Abnormal; Notable for the following components:   RBC 2.54 (*)    Hemoglobin 7.2 (*)    HCT 21.4 (*)    All other components within normal limits  URINALYSIS, COMPLETE (UACMP) WITH MICROSCOPIC - Abnormal; Notable for the following components:   Color, Urine YELLOW (*)    APPearance HAZY (*)    Glucose, UA 50 (*)    Hgb urine dipstick MODERATE (*)    Protein, ur 30 (*)    Leukocytes,Ua SMALL (*)    Bacteria, UA RARE (*)    All other components within normal limits  CBG MONITORING, ED - Abnormal; Notable for the following components:   Glucose-Capillary 239 (*)    All other components within normal limits  CBG MONITORING, ED - Abnormal; Notable for the following components:   Glucose-Capillary 231 (*)    All other components within normal limits  URINE DRUG SCREEN, QUALITATIVE ([facility] ONLY)  ETHANOL  LIPASE, BLOOD  TROPONIN I (HIGH SENSITIVITY)   ____________________________________________  EKG  ED ECG REPORT I, Merwyn Katos, the attending physician, personally viewed and interpreted this ECG.  Date: 09/28/2020 EKG Time: 1331 Rate: 86 Rhythm: normal sinus rhythm QRS Axis: normal Intervals: normal ST/T Wave abnormalities: normal Narrative Interpretation: no evidence of acute ischemia  PROCEDURES  Procedure(s) performed (including Critical Care):  .1-3 Lead EKG Interpretation Performed by: Merwyn Katos, MD Authorized by: Merwyn Katos, MD     Interpretation: normal     ECG rate:  74   ECG rate assessment: normal     Rhythm: sinus rhythm     Ectopy: none     Conduction: normal       ____________________________________________   INITIAL  IMPRESSION / ASSESSMENT AND PLAN / ED COURSE  As part of my medical decision making, I reviewed the following data within the electronic MEDICAL RECORD NUMBER Nursing notes reviewed and incorporated, Labs reviewed, EKG interpreted, Old chart reviewed, Radiograph reviewed and Notes from prior ED visits reviewed and incorporated     Insulin Dependent Diabetic presents BIBA for hypoglycemia in setting of recent decreased oral intake with normal insulin use.  Given History, Exam, and Workup I have a low suspicion for sepsis induced hypoglycemia, long lasting medication overdose, suicidal ideation/purposeful overdose attempt.    Reassessment: AMS resolved with glucose administration and patient currently PO tolerant. After observation in the emergency department for several  hours, the Blood sugar has remained stable, and based on the history is expected that the Blood sugar should remain stable after discharge. Disposition: Plan discharge home with close primary care follow-up. Consider in patients who were hypoglycemic but didn't just skip a meal and you're discharging: Will advise to decrease insulin 15-25% until patient can see primary care provider within the week.      ____________________________________________   FINAL CLINICAL IMPRESSION(S) / ED DIAGNOSES  Final diagnoses:  Hypoglycemia     ED Discharge Orders         Ordered    ondansetron (ZOFRAN ODT) 4 MG disintegrating tablet  Every 8 hours PRN        09/28/20 1529           Note:  This document was prepared using Dragon voice recognition software and may include unintentional dictation errors.   Merwyn Katos, MD 09/30/20 [phone]

## 2020-09-28 NOTE — ED Triage Notes (Signed)
[redacted] weeks pregnant, dm, altered, hypoglycemic

## 2020-09-29 LAB — CYTOLOGY - PAP
Chlamydia: NEGATIVE
Comment: NEGATIVE
Comment: NORMAL
Diagnosis: NEGATIVE
Neisseria Gonorrhea: NEGATIVE

## 2020-09-30 ENCOUNTER — Telehealth: Payer: Self-pay | Admitting: Obstetrics and Gynecology

## 2020-09-30 DIAGNOSIS — K3184 Gastroparesis: Secondary | ICD-10-CM | POA: Diagnosis not present

## 2020-09-30 DIAGNOSIS — O24419 Gestational diabetes mellitus in pregnancy, unspecified control: Secondary | ICD-10-CM | POA: Diagnosis not present

## 2020-09-30 LAB — MATERNIT21  PLUS CORE+ESS+SCA, BLOOD

## 2020-09-30 NOTE — Telephone Encounter (Signed)
New Message:  Pt states she had an episode that her sugar dropped real low and she ended up in the hospital on Sunday.  She said while she was there they did not check the baby to make sure everything was okay.  She was told there was no need

## 2020-10-02 DIAGNOSIS — O24012 Pre-existing diabetes mellitus, type 1, in pregnancy, second trimester: Secondary | ICD-10-CM | POA: Diagnosis not present

## 2020-10-02 NOTE — Telephone Encounter (Signed)
This pt seen [facility] last never seen by [facility] will you please advise and see if [name] wants to see her. Thanks [employer]

## 2020-10-02 NOTE — Telephone Encounter (Signed)
Spoke to pt concerning her call to the office. Pt stated that she was concerned about her baby and her blood sugar levels dropping daily. Pt stated that she has noticed a drop in her blood sugar levels daily and was concerned if that was normal and if she needed to be seen.

## 2020-10-03 NOTE — Telephone Encounter (Signed)
LM for patient to return call.

## 2020-10-09 DIAGNOSIS — O0992 Supervision of high risk pregnancy, unspecified, second trimester: Secondary | ICD-10-CM | POA: Diagnosis not present

## 2020-10-09 DIAGNOSIS — E1065 Type 1 diabetes mellitus with hyperglycemia: Secondary | ICD-10-CM | POA: Diagnosis not present

## 2020-10-09 DIAGNOSIS — E1042 Type 1 diabetes mellitus with diabetic polyneuropathy: Secondary | ICD-10-CM | POA: Diagnosis not present

## 2020-10-12 DIAGNOSIS — E108 Type 1 diabetes mellitus with unspecified complications: Secondary | ICD-10-CM | POA: Diagnosis not present

## 2020-10-12 DIAGNOSIS — E1165 Type 2 diabetes mellitus with hyperglycemia: Secondary | ICD-10-CM | POA: Diagnosis not present

## 2020-10-12 DIAGNOSIS — Z20822 Contact with and (suspected) exposure to covid-19: Secondary | ICD-10-CM | POA: Diagnosis not present

## 2020-10-12 DIAGNOSIS — O24012 Pre-existing diabetes mellitus, type 1, in pregnancy, second trimester: Secondary | ICD-10-CM | POA: Diagnosis not present

## 2020-10-12 DIAGNOSIS — E162 Hypoglycemia, unspecified: Secondary | ICD-10-CM | POA: Diagnosis not present

## 2020-10-12 DIAGNOSIS — Z9641 Presence of insulin pump (external) (internal): Secondary | ICD-10-CM | POA: Diagnosis not present

## 2020-10-12 DIAGNOSIS — Z3A16 16 weeks gestation of pregnancy: Secondary | ICD-10-CM | POA: Diagnosis not present

## 2020-10-12 DIAGNOSIS — R112 Nausea with vomiting, unspecified: Secondary | ICD-10-CM | POA: Diagnosis not present

## 2020-10-12 DIAGNOSIS — E11649 Type 2 diabetes mellitus with hypoglycemia without coma: Secondary | ICD-10-CM | POA: Diagnosis not present

## 2020-10-12 DIAGNOSIS — R638 Other symptoms and signs concerning food and fluid intake: Secondary | ICD-10-CM | POA: Diagnosis not present

## 2020-10-12 DIAGNOSIS — E161 Other hypoglycemia: Secondary | ICD-10-CM | POA: Diagnosis not present

## 2020-10-13 DIAGNOSIS — O09892 Supervision of other high risk pregnancies, second trimester: Secondary | ICD-10-CM | POA: Insufficient documentation

## 2020-10-13 DIAGNOSIS — R638 Other symptoms and signs concerning food and fluid intake: Secondary | ICD-10-CM | POA: Diagnosis not present

## 2020-10-13 DIAGNOSIS — E108 Type 1 diabetes mellitus with unspecified complications: Secondary | ICD-10-CM | POA: Diagnosis not present

## 2020-10-13 DIAGNOSIS — R112 Nausea with vomiting, unspecified: Secondary | ICD-10-CM | POA: Diagnosis not present

## 2020-10-13 DIAGNOSIS — Z9641 Presence of insulin pump (external) (internal): Secondary | ICD-10-CM | POA: Diagnosis not present

## 2020-10-14 DIAGNOSIS — O24012 Pre-existing diabetes mellitus, type 1, in pregnancy, second trimester: Secondary | ICD-10-CM | POA: Diagnosis not present

## 2020-10-16 DIAGNOSIS — E162 Hypoglycemia, unspecified: Secondary | ICD-10-CM | POA: Diagnosis not present

## 2020-10-16 DIAGNOSIS — R809 Proteinuria, unspecified: Secondary | ICD-10-CM | POA: Diagnosis not present

## 2020-10-16 DIAGNOSIS — K3184 Gastroparesis: Secondary | ICD-10-CM | POA: Diagnosis not present

## 2020-10-16 DIAGNOSIS — E1029 Type 1 diabetes mellitus with other diabetic kidney complication: Secondary | ICD-10-CM | POA: Diagnosis not present

## 2020-10-16 DIAGNOSIS — N289 Disorder of kidney and ureter, unspecified: Secondary | ICD-10-CM | POA: Diagnosis not present

## 2020-10-16 DIAGNOSIS — H3500 Unspecified background retinopathy: Secondary | ICD-10-CM | POA: Diagnosis not present

## 2020-10-16 DIAGNOSIS — E1042 Type 1 diabetes mellitus with diabetic polyneuropathy: Secondary | ICD-10-CM | POA: Diagnosis not present

## 2020-10-16 DIAGNOSIS — E109 Type 1 diabetes mellitus without complications: Secondary | ICD-10-CM | POA: Diagnosis not present

## 2020-10-16 DIAGNOSIS — E1065 Type 1 diabetes mellitus with hyperglycemia: Secondary | ICD-10-CM | POA: Diagnosis not present

## 2020-10-16 DIAGNOSIS — O0992 Supervision of high risk pregnancy, unspecified, second trimester: Secondary | ICD-10-CM | POA: Diagnosis not present

## 2020-10-21 ENCOUNTER — Other Ambulatory Visit: Payer: Self-pay | Admitting: Obstetrics and Gynecology

## 2020-10-21 DIAGNOSIS — E109 Type 1 diabetes mellitus without complications: Secondary | ICD-10-CM

## 2020-10-21 DIAGNOSIS — O24012 Pre-existing diabetes mellitus, type 1, in pregnancy, second trimester: Secondary | ICD-10-CM | POA: Diagnosis not present

## 2020-10-22 NOTE — Patient Instructions (Addendum)
WHAT OB PATIENTS CAN EXPECT   Confirmation of pregnancy and ultrasound ordered if medically indicated-[redacted] weeks gestation  New OB (NOB) intake with nurse and New OB (NOB) labs- [redacted] weeks gestation  New OB (NOB) physical examination with provider- 11/[redacted] weeks gestation  Flu vaccine-[redacted] weeks gestation  Anatomy scan-[redacted] weeks gestation  Glucose tolerance test, blood work to test for anemia, T-dap vaccine-[redacted] weeks gestation  Vaginal swabs/cultures-STD/Group B strep-[redacted] weeks gestation  Appointments every 4 weeks until 28 weeks  Every 2 weeks from 28 weeks until 36 weeks  Weekly visits from 36 weeks until delivery  Second Trimester of Pregnancy  The second trimester of pregnancy is from week 13 through week 27. This is also called months 4 through 6 of pregnancy. This is often the time when you feel your best. During the second trimester:  Morning sickness is less or has stopped.  You may have more energy.  You may feel hungry more often. At this time, your unborn baby (fetus) is growing very fast. At the end of the sixth month, the unborn baby may be up to 12 inches long and weigh about 1 pounds. You will likely start to feel the baby move between 16 and 20 weeks of pregnancy. Body changes during your second trimester Your body continues to go through many changes during this time. The changes vary and generally return to normal after the baby is born. Physical changes  You will gain more weight.  You may start to get stretch marks on your hips, belly (abdomen), and breasts.  Your breasts will grow and may hurt.  Dark spots or blotches may develop on your face.  A dark line from your belly button to the pubic area (linea nigra) may appear.  You may have changes in your hair. Health changes  You may have headaches.  You may have heartburn.  You may have trouble pooping (constipation).  You may have hemorrhoids or swollen, bulging veins (varicose veins).  Your gums may  bleed.  You may pee (urinate) more often.  You may have back pain. Follow these instructions at home: Medicines  Take over-the-counter and prescription medicines only as told by your doctor. Some medicines are not safe during pregnancy.  Take a prenatal vitamin that contains at least 600 micrograms (mcg) of folic acid. Eating and drinking  Eat healthy meals that include: ? Fresh fruits and vegetables. ? Whole grains. ? Good sources of protein, such as meat, eggs, or tofu. ? Low-fat dairy products.  Avoid raw meat and unpasteurized juice, milk, and cheese.  You may need to take these actions to prevent or treat trouble pooping: ? Drink enough fluids to keep your pee (urine) pale yellow. ? Eat foods that are high in fiber. These include beans, whole grains, and fresh fruits and vegetables. ? Limit foods that are high in fat and sugar. These include fried or sweet foods. Activity  Exercise only as told by your doctor. Most people can do their usual exercise during pregnancy. Try to exercise for 30 minutes at least 5 days a week.  Stop exercising if you have pain or cramps in your belly or lower back.  Do not exercise if it is too hot or too humid, or if you are in a place of great height (high altitude).  Avoid heavy lifting.  If you choose to, you may have sex unless your doctor tells you not to. Relieving pain and discomfort  Wear a good support bra if your breasts   are sore.  Take warm water baths (sitz baths) to soothe pain or discomfort caused by hemorrhoids. Use hemorrhoid cream if your doctor approves.  Rest with your legs raised (elevated) if you have leg cramps or low back pain.  If you develop bulging veins in your legs: ? Wear support hose as told by your doctor. ? Raise your feet for 15 minutes, 3-4 times a day. ? Limit salt in your food. Safety  Wear your seat belt at all times when you are in a car.  Talk with your doctor if someone is hurting you or  yelling at you a lot. Lifestyle  Do not use hot tubs, steam rooms, or saunas.  Do not douche. Do not use tampons or scented sanitary pads.  Avoid cat litter boxes and soil used by cats. These carry germs that can harm your baby and can cause a loss of your baby by miscarriage or stillbirth.  Do not use herbal medicines, illegal drugs, or medicines that are not approved by your doctor. Do not drink alcohol.  Do not smoke or use any products that contain nicotine or tobacco. If you need help quitting, ask your doctor. General instructions  Keep all follow-up visits. This is important.  Ask your doctor about local prenatal classes.  Ask your doctor about the right foods to eat or for help finding a counselor. Where to find more information  American Pregnancy Association: americanpregnancy.org  American College of Obstetricians and Gynecologists: www.acog.org  Office on Women's Health: womenshealth.gov/pregnancy Contact a doctor if:  You have a headache that does not go away when you take medicine.  You have changes in how you see, or you see spots in front of your eyes.  You have mild cramps, pressure, or pain in your lower belly.  You continue to feel like you may vomit (nauseous), you vomit, or you have watery poop (diarrhea).  You have bad-smelling fluid coming from your vagina.  You have pain when you pee or your pee smells bad.  You have very bad swelling of your face, hands, ankles, feet, or legs.  You have a fever. Get help right away if:  You are leaking fluid from your vagina.  You have spotting or bleeding from your vagina.  You have very bad belly cramping or pain.  You have trouble breathing.  You have chest pain.  You faint.  You have not felt your baby move for the time period told by your doctor.  You have new or increased pain, swelling, or redness in an arm or leg. Summary  The second trimester of pregnancy is from week 13 through week 27  (months 4 through 6).  Eat healthy meals.  Exercise as told by your doctor. Most people can do their usual exercise during pregnancy.  Do not use herbal medicines, illegal drugs, or medicines that are not approved by your doctor. Do not drink alcohol.  Call your doctor if you get sick or if you notice anything unusual about your pregnancy. This information is not intended to replace advice given to you by your health care provider. Make sure you discuss any questions you have with your health care provider. Document Revised: 10/17/2019 Document Reviewed: 08/23/2019 Elsevier Patient Education  2021 Elsevier Inc. Common Medications Safe in Pregnancy  Acne:      Constipation:  Benzoyl Peroxide     Colace  Clindamycin      Dulcolax Suppository  Topica Erythromycin     Fibercon  Salicylic Acid        Metamucil         Miralax AVOID:        Senakot   Accutane    Cough:  Retin-A       Cough Drops  Tetracycline      Phenergan w/ Codeine if Rx  Minocycline      Robitussin (Plain & DM)  Antibiotics:     Crabs/Lice:  Ceclor       RID  Cephalosporins    AVOID:  E-Mycins      Kwell  Keflex  Macrobid/Macrodantin   Diarrhea:  Penicillin      Kao-Pectate  Zithromax      Imodium AD         PUSH FLUIDS AVOID:       Cipro     Fever:  Tetracycline      Tylenol (Regular or Extra  Minocycline       Strength)  Levaquin      Extra Strength-Do not          Exceed 8 tabs/24 hrs Caffeine:        <200mg/day (equiv. To 1 cup of coffee or  approx. 3 12 oz sodas)         Gas: Cold/Hayfever:       Gas-X  Benadryl      Mylicon  Claritin       Phazyme  **Claritin-D        Chlor-Trimeton    Headaches:  Dimetapp      ASA-Free Excedrin  Drixoral-Non-Drowsy     Cold Compress  Mucinex (Guaifenasin)     Tylenol (Regular or Extra  Sudafed/Sudafed-12 Hour     Strength)  **Sudafed PE Pseudoephedrine   Tylenol Cold & Sinus     Vicks Vapor Rub  Zyrtec  **AVOID if Problems With Blood  Pressure         Heartburn: Avoid lying down for at least 1 hour after meals  Aciphex      Maalox     Rash:  Milk of Magnesia     Benadryl    Mylanta       1% Hydrocortisone Cream  Pepcid  Pepcid Complete   Sleep Aids:  Prevacid      Ambien   Prilosec       Benadryl  Rolaids       Chamomile Tea  Tums (Limit 4/day)     Unisom         Tylenol PM         Warm milk-add vanilla or  Hemorrhoids:       Sugar for taste  Anusol/Anusol H.C.  (RX: Analapram 2.5%)  Sugar Substitutes:  Hydrocortisone OTC     Ok in moderation  Preparation H      Tucks        Vaseline lotion applied to tissue with wiping    Herpes:     Throat:  Acyclovir      Oragel  Famvir  Valtrex     Vaccines:         Flu Shot Leg Cramps:       *Gardasil  Benadryl      Hepatitis A         Hepatitis B Nasal Spray:       Pneumovax  Saline Nasal Spray     Polio Booster         Tetanus Nausea:       Tuberculosis test or PPD  Vitamin B6 25 mg TID   AVOID:      Dramamine      *Gardasil  Emetrol       Live Poliovirus  Ginger Root 250 mg QID    MMR (measles, mumps &  High Complex Carbs @ Bedtime    rebella)  Sea Bands-Accupressure    Varicella (Chickenpox)  Unisom 1/2 tab TID     *No known complications           If received before Pain:         Known pregnancy;   Darvocet       Resume series after  Lortab        Delivery  Percocet    Yeast:   Tramadol      Femstat  Tylenol 3      Gyne-lotrimin  Ultram       Monistat  Vicodin           MISC:         All Sunscreens           Hair Coloring/highlights          Insect Repellant's          (Including DEET)         Mystic Tans    Pregnancy and Anemia  Anemia is a condition in which there is not enough red blood cells or hemoglobin in the blood. Hemoglobin is a substance in red blood cells that carries oxygen. When you do not have enough red blood cells or hemoglobin (are anemic), your body cannot get enough oxygen and your organs may not work  properly. Anemia is common during pregnancy because your body needs more blood volume and blood cells to provide nutrition to the unborn baby. What are the causes? The most common cause of anemia during pregnancy is not having enough iron in the body to make red blood cells (iron deficiency anemia). Other causes may include:  Folic acid deficiency.  Vitamin B12 deficiency.  Certain prescription or over-the-counter medicines.  Certain medical conditions or infections that destroy red blood cells.  A low platelet count and bleeding caused by antibodies that go through the placenta to the baby from the mother's blood. What are the signs or symptoms? Mild anemia may not cause any symptoms. If anemia becomes severe, symptoms may include:  Feeling tired or weak.  Shortness of breath, especially during activity.  Fainting.  Pale skin.  Headaches.  A fast or irregular heartbeat.  Dizziness. How is this diagnosed? This condition may be diagnosed based on your medical history and a physical exam. You may also have blood tests. How is this treated? Treatment for anemia during pregnancy depends on the cause of the anemia. Treatment may include:  Making changes to your diet.  Taking iron, vitamin W73, or folic acid supplements.  Having a blood transfusion. This may be needed if the anemia is severe. Follow these instructions at home: Eating and drinking  Follow recommendations from your health care provider about changing your diet.  Eat a diet rich in iron. This would include foods such as: ? Liver. ? Beef. ? Eggs. ? Whole grains. ? Spinach. ? Dried fruit.  Increase your vitamin C intake. This will help the stomach absorb more iron. Some foods that are high in vitamin C include: ? Oranges. ? Peppers. ? Tomatoes. ? Mangoes.  Eat green leafy vegetables. These are a good source of folic acid. General instructions  Take iron supplements and vitamins as told by your  health care provider.  Keep all follow-up  visits. This is important. Contact a health care provider if:  You have headaches that happen often or do not go away.  You bruise easily.  You have a fever.  You have nausea and vomiting for more than 24 hours.  You are unable to take supplements prescribed to treat your anemia. Get help right away if:  You develop signs or symptoms of severe anemia.  You have bleeding from your vagina.  You develop a rash.  You have bloody or tarry stools.  You are very dizzy or you faint. Summary  Anemia is a condition in which there is not enough red blood cells or hemoglobin in the blood.  The most common cause of anemia during pregnancy is not having enough iron in the body to make red blood cells (iron deficiency anemia).  Mild anemia may not cause any symptoms. If it becomes severe, symptoms may include feeling tired and weak.  Take iron supplements and vitamins as told by your health care provider.  Keep all follow-up visits. This is important. This information is not intended to replace advice given to you by your health care provider. Make sure you discuss any questions you have with your health care provider. Document Revised: 09/11/2019 Document Reviewed: 09/11/2019 Elsevier Patient Education  2021 [employer].

## 2020-10-23 ENCOUNTER — Ambulatory Visit (INDEPENDENT_AMBULATORY_CARE_PROVIDER_SITE_OTHER): Payer: Medicaid Other | Admitting: Obstetrics and Gynecology

## 2020-10-23 ENCOUNTER — Other Ambulatory Visit: Payer: Self-pay

## 2020-10-23 ENCOUNTER — Encounter: Payer: Self-pay | Admitting: Obstetrics and Gynecology

## 2020-10-23 VITALS — BP 99/64 | HR 102 | Ht 61.0 in | Wt 124.1 lb

## 2020-10-23 DIAGNOSIS — Z1379 Encounter for other screening for genetic and chromosomal anomalies: Secondary | ICD-10-CM | POA: Diagnosis not present

## 2020-10-23 DIAGNOSIS — O99012 Anemia complicating pregnancy, second trimester: Secondary | ICD-10-CM

## 2020-10-23 DIAGNOSIS — O219 Vomiting of pregnancy, unspecified: Secondary | ICD-10-CM

## 2020-10-23 DIAGNOSIS — Z3A17 17 weeks gestation of pregnancy: Secondary | ICD-10-CM

## 2020-10-23 DIAGNOSIS — Z3482 Encounter for supervision of other normal pregnancy, second trimester: Secondary | ICD-10-CM

## 2020-10-23 DIAGNOSIS — O1202 Gestational edema, second trimester: Secondary | ICD-10-CM

## 2020-10-23 DIAGNOSIS — E109 Type 1 diabetes mellitus without complications: Secondary | ICD-10-CM

## 2020-10-23 LAB — POCT URINALYSIS DIPSTICK OB
Bilirubin, UA: NEGATIVE
Glucose, UA: NEGATIVE
Ketones, UA: NEGATIVE
Leukocytes, UA: NEGATIVE
Nitrite, UA: NEGATIVE
Spec Grav, UA: 1.015 (ref 1.010–1.025)
Urobilinogen, UA: 0.2 E.U./dL
pH, UA: 6.5 (ref 5.0–8.0)

## 2020-10-23 MED ORDER — ONDANSETRON 4 MG PO TBDP: 4.0000 mg | ORAL_TABLET | Freq: Three times a day (TID) | ORAL | 1 refills | Status: DC | PRN

## 2020-10-23 NOTE — Progress Notes (Signed)
ROB: Patient reports complaints of nausea and vomiting, only partially managed with Zofran (notes it works when symptoms are mild but if more intense does not help as much). Discussed adding Diclegis back to her regimen as well.  Ginger makes symptoms worse.  UA with +2 proteinuria today.  Likely some dehydration due to nausea/vomiting.   Reports issues with low blood sugar (likely secondary to nausea/vomiting, unable to keep blood sugars up with medications due to not being able to keep food down).  Has had 2 episodes of significant hypoglycemia to where she became unresponsive.  She had to be resuscitated in the ER once, and the other time she was able to be resuscitated by EMS at her home.  Has discussed with Endocrinologist, who recommends that she get glucose tablets. I also suggested honey.  Initially had trouble getting Dexcom sensors but was able to pick some up from her Endocrinologist.  Has had diabetic foot exam, and has an appointment for eye exam soon. Reports most recent HgbA1c was 7. Ordered baseline PC/ratio.  Is taking baby aspirin.  Notes that her Endocrinologist strongly encouraged for her to seek MFM care at [facility] where she has been receiving her other care, however patient notes that she lives in [place], and has had issues trying to make appointments due to scheduling conflicts with her children's schedules. Desires to establish with local MFM. She has cancelled [facility] MFM appointment and scan. Needs new referral. Will place.   Pitting edema noted on exam today, +1 to +2 up to mid shin bilaterally, R>L. Also notes bad scratch on her right ankle, but appears to be healing.   Lastly, review of chart notes that patient had Hgb of 7.2 on NOB labs. Will refer to Hematology as she will likely benefit from iron infusion in pregnancy. Will also repeat MaterniT21 testing as not enough blood to send sample last visit.

## 2020-10-23 NOTE — Progress Notes (Signed)
OB-PT present for routine prenatal care. Pt stated having nausea and vomiting, abd pain and low blood sugar due to vomiting.

## 2020-10-25 LAB — AFP, SERUM, OPEN SPINA BIFIDA
AFP MoM: 1.23
AFP Value: 46.3 ng/mL
Gest. Age on Collection Date: 17.3 weeks
Maternal Age At EDD: 29.8 yr
OSBR Risk 1 IN: 1769
Test Results:: NEGATIVE
Weight: 124 [lb_av]

## 2020-10-28 DIAGNOSIS — R4182 Altered mental status, unspecified: Secondary | ICD-10-CM | POA: Diagnosis not present

## 2020-10-28 DIAGNOSIS — E10649 Type 1 diabetes mellitus with hypoglycemia without coma: Secondary | ICD-10-CM | POA: Diagnosis not present

## 2020-10-28 DIAGNOSIS — E162 Hypoglycemia, unspecified: Secondary | ICD-10-CM | POA: Diagnosis not present

## 2020-10-28 DIAGNOSIS — R0902 Hypoxemia: Secondary | ICD-10-CM | POA: Diagnosis not present

## 2020-10-28 DIAGNOSIS — O24012 Pre-existing diabetes mellitus, type 1, in pregnancy, second trimester: Secondary | ICD-10-CM | POA: Diagnosis not present

## 2020-10-28 DIAGNOSIS — O24419 Gestational diabetes mellitus in pregnancy, unspecified control: Secondary | ICD-10-CM | POA: Diagnosis not present

## 2020-10-28 DIAGNOSIS — E161 Other hypoglycemia: Secondary | ICD-10-CM | POA: Diagnosis not present

## 2020-10-28 DIAGNOSIS — Z3A18 18 weeks gestation of pregnancy: Secondary | ICD-10-CM | POA: Diagnosis not present

## 2020-10-28 DIAGNOSIS — R410 Disorientation, unspecified: Secondary | ICD-10-CM | POA: Diagnosis not present

## 2020-10-28 LAB — MATERNIT21  PLUS CORE+ESS+SCA, BLOOD
11q23 deletion (Jacobsen): NOT DETECTED
15q11 deletion (PW Angelman): NOT DETECTED
1p36 deletion syndrome: NOT DETECTED
22q11 deletion (DiGeorge): NOT DETECTED
4p16 deletion(Wolf-Hirschhorn): NOT DETECTED
5p15 deletion (Cri-du-chat): NOT DETECTED
8q24 deletion (Langer-Giedion): NOT DETECTED
Fetal Fraction: 7
Monosomy X (Turner Syndrome): NOT DETECTED
Result (T21): NEGATIVE
Trisomy 13 (Patau syndrome): NEGATIVE
Trisomy 16: NOT DETECTED
Trisomy 18 (Edwards syndrome): NEGATIVE
Trisomy 21 (Down syndrome): NEGATIVE
Trisomy 22: NOT DETECTED
XXX (Triple X Syndrome): NOT DETECTED
XXY (Klinefelter Syndrome): NOT DETECTED
XYY (Jacobs Syndrome): NOT DETECTED

## 2020-10-29 ENCOUNTER — Emergency Department
Admission: EM | Admit: 2020-10-29 | Discharge: 2020-10-29 | Disposition: A | Discharge status: Home / Self Care | Payer: Medicaid Other | Attending: Emergency Medicine | Admitting: Emergency Medicine

## 2020-10-29 ENCOUNTER — Other Ambulatory Visit: Payer: Self-pay

## 2020-10-29 DIAGNOSIS — E10649 Type 1 diabetes mellitus with hypoglycemia without coma: Secondary | ICD-10-CM | POA: Insufficient documentation

## 2020-10-29 DIAGNOSIS — E1010 Type 1 diabetes mellitus with ketoacidosis without coma: Secondary | ICD-10-CM | POA: Diagnosis not present

## 2020-10-29 DIAGNOSIS — R61 Generalized hyperhidrosis: Secondary | ICD-10-CM | POA: Diagnosis not present

## 2020-10-29 DIAGNOSIS — R410 Disorientation, unspecified: Secondary | ICD-10-CM | POA: Diagnosis not present

## 2020-10-29 DIAGNOSIS — Z794 Long term (current) use of insulin: Secondary | ICD-10-CM | POA: Insufficient documentation

## 2020-10-29 DIAGNOSIS — Z3A16 16 weeks gestation of pregnancy: Secondary | ICD-10-CM | POA: Insufficient documentation

## 2020-10-29 DIAGNOSIS — I10 Essential (primary) hypertension: Secondary | ICD-10-CM | POA: Insufficient documentation

## 2020-10-29 DIAGNOSIS — O99810 Abnormal glucose complicating pregnancy: Secondary | ICD-10-CM | POA: Diagnosis not present

## 2020-10-29 DIAGNOSIS — Z20822 Contact with and (suspected) exposure to covid-19: Secondary | ICD-10-CM | POA: Diagnosis not present

## 2020-10-29 DIAGNOSIS — O99012 Anemia complicating pregnancy, second trimester: Secondary | ICD-10-CM | POA: Insufficient documentation

## 2020-10-29 DIAGNOSIS — E1065 Type 1 diabetes mellitus with hyperglycemia: Secondary | ICD-10-CM | POA: Diagnosis not present

## 2020-10-29 DIAGNOSIS — Z3A18 18 weeks gestation of pregnancy: Secondary | ICD-10-CM | POA: Diagnosis not present

## 2020-10-29 DIAGNOSIS — R404 Transient alteration of awareness: Secondary | ICD-10-CM | POA: Diagnosis not present

## 2020-10-29 DIAGNOSIS — E162 Hypoglycemia, unspecified: Secondary | ICD-10-CM | POA: Diagnosis not present

## 2020-10-29 DIAGNOSIS — Z7982 Long term (current) use of aspirin: Secondary | ICD-10-CM | POA: Diagnosis not present

## 2020-10-29 DIAGNOSIS — O24112 Pre-existing diabetes mellitus, type 2, in pregnancy, second trimester: Secondary | ICD-10-CM | POA: Diagnosis not present

## 2020-10-29 DIAGNOSIS — O24012 Pre-existing diabetes mellitus, type 1, in pregnancy, second trimester: Secondary | ICD-10-CM | POA: Diagnosis not present

## 2020-10-29 DIAGNOSIS — D509 Iron deficiency anemia, unspecified: Secondary | ICD-10-CM

## 2020-10-29 LAB — COMPREHENSIVE METABOLIC PANEL
ALT: 7 U/L (ref 0–44)
AST: 16 U/L (ref 15–41)
Albumin: 2.8 g/dL — ABNORMAL LOW (ref 3.5–5.0)
Alkaline Phosphatase: 40 U/L (ref 38–126)
Anion gap: 4 — ABNORMAL LOW (ref 5–15)
BUN: 13 mg/dL (ref 6–20)
CO2: 23 mmol/L (ref 22–32)
Calcium: 8.1 mg/dL — ABNORMAL LOW (ref 8.9–10.3)
Chloride: 106 mmol/L (ref 98–111)
Creatinine, Ser: 0.81 mg/dL (ref 0.44–1.00)
GFR, Estimated: >60 mL/min (ref >60)
Glucose, Bld: 139 mg/dL — ABNORMAL HIGH (ref 70–99)
Potassium: 3.9 mmol/L (ref 3.5–5.1)
Sodium: 133 mmol/L — ABNORMAL LOW (ref 135–145)
Total Bilirubin: 0.7 mg/dL (ref 0.3–1.2)
Total Protein: 6 g/dL — ABNORMAL LOW (ref 6.5–8.1)

## 2020-10-29 LAB — CBC
HCT: 17.7 % — ABNORMAL LOW (ref 36.0–46.0)
Hemoglobin: 6.1 g/dL — ABNORMAL LOW (ref 12.0–15.0)
MCH: 29.8 pg (ref 26.0–34.0)
MCHC: 34.5 g/dL (ref 30.0–36.0)
MCV: 86.3 fL (ref 80.0–100.0)
Platelets: 162 10*3/uL (ref 150–400)
RBC: 2.05 MIL/uL — ABNORMAL LOW (ref 3.87–5.11)
RDW: 13.7 % (ref 11.5–15.5)
WBC: 3.3 10*3/uL — ABNORMAL LOW (ref 4.0–10.5)
nRBC: 0 % (ref 0.0–0.2)

## 2020-10-29 LAB — CBG MONITORING, ED: Glucose-Capillary: 117 mg/dL — ABNORMAL HIGH (ref 70–99)

## 2020-10-29 NOTE — ED Notes (Signed)
Pt alert and talking with staff, able to answer questions but states "I still feel a little confused." Pt given warm blankets. Stretcher locked in low position with side rails up x2, call light in reach.

## 2020-10-29 NOTE — ED Provider Notes (Signed)
[facility] Emergency Department Provider Note   ____________________________________________    I have reviewed the triage vital signs and the nursing notes.   HISTORY  Chief Complaint Hypoglycemia     HPI Ashley Camacho is a 30 y.o. female with history of brittle diabetes, who is approximately [redacted] weeks pregnant.  She is followed by [facility] OB[facility]GYN, [facility] endocrinology.  She reports her blood sugar got low this morning.  She does have an insulin pump.  She is somewhat of a poor historian.  Review of medical records demonstrates the patient has been seen numerous times by endocrinology, was seen in the [facility] ED for low blood sugars on 22 May, patient had been having overnight low blood sugars, insulin pump basal insulin was adjusted at that time.  Has followed up with endocrinology since then as well.  Has an appointment tomorrow with hematology for iron deficiency anemia.  Currently feels well with no complaint  Past Medical History:  Diagnosis Date  . Anemia   . Diabetes mellitus without complication (HCC)   . Gastroparesis   . Hypertension   . Type 1 diabetes [facility])     Patient Active Problem List   Diagnosis Date Noted  . Nausea and vomiting during pregnancy 10/23/2020  . Adjustment disorder with mixed anxiety and depressed mood 05/14/2016  . DKA (diabetic ketoacidoses) 04/03/2016    Past Surgical History:  Procedure Laterality Date  . CESAREAN SECTION    . MOUTH SURGERY      Prior to Admission medications   Medication Sig Start Date End Date Taking? Authorizing Provider  aspirin EC 81 MG tablet Take 81 mg by mouth daily. Swallow whole.    [provider]  ferrous sulfate 324 MG TBEC Take 324 mg by mouth.    [provider]  insulin aspart (NOVOLOG) 100 UNIT/ML injection Inject 0-12 Units into the skin 3 (three) times daily before meals. 11/29/17   Houston Siren, MD  ondansetron (ZOFRAN ODT) 4 MG disintegrating tablet Take 1  tablet (4 mg total) by mouth every 8 (eight) hours as needed for nausea or vomiting. 10/23/20   Hildred Laser, MD  Prenatal Vit-Fe Fumarate-FA (PRENATAL MULTIVITAMIN) TABS tablet Take 1 tablet by mouth daily at 12 noon.    [provider]     Allergies Patient has no known allergies.  Family History  Problem Relation Age of Onset  . Diabetes type I Father   . CAD Father   . CAD Paternal Grandmother   . CAD Paternal Grandfather   . Breast cancer Mother   . Ovarian cancer Neg Hx     Social History Social History   Tobacco Use  . Smoking status: Never Smoker  . Smokeless tobacco: Never Used  Vaping Use  . Vaping Use: Never used  Substance Use Topics  . Alcohol use: No  . Drug use: No    Review of Systems  Constitutional: No fever/chills Eyes: No visual changes.  ENT: No sore throat. Cardiovascular: Denies chest pain. Respiratory: Denies shortness of breath. Gastrointestinal: No abdominal pain.  Genitourinary: Negative for dysuria. Musculoskeletal: Negative for back pain. Skin: Negative for rash. Neurological: Negative for headaches or weakness   ____________________________________________   PHYSICAL EXAM:  VITAL SIGNS: ED Triage Vitals  Enc Vitals Group     BP 10/29/20 1333 122/70     Pulse Rate 10/29/20 1333 72     Resp 10/29/20 1333 16     Temp 10/29/20 1333 (!) 96.7  F (35.9 C)     Temp src --      SpO2 10/29/20 1333 100 %     Weight 10/29/20 1334 57 kg (125 lb 10.6 oz)     Height 10/29/20 1334 1.549 m (5\' 1" )     Head Circumference --      Peak Flow --      Pain Score 10/29/20 1333 0     Pain Loc --      Pain Edu? --      Excl. in GC? --     Constitutional: Alert and oriented. No acute distress. Pleasant and interactive  Nose: No congestion/rhinnorhea. Mouth/Throat: Mucous membranes are moist.   Neck:  Painless ROM Cardiovascular: Normal rate, regular rhythm. Grossly normal heart sounds.  Good peripheral circulation. Respiratory:  Normal respiratory effort.  No retractions. Lungs CTAB. Gastrointestinal: Soft and nontender. No distention.    Musculoskeletal: No lower extremity tenderness nor edema.  Warm and well perfused Neurologic:  Normal speech and language. No gross focal neurologic deficits are appreciated.  Skin:  Skin is warm, dry and intact. No rash noted. Psychiatric: Mood and affect are normal. Speech and behavior are normal.  ____________________________________________   LABS (all labs ordered are listed, but only abnormal results are displayed)  Labs Reviewed  CBC - Abnormal; Notable for the following components:      Result Value   WBC 3.3 (*)    RBC 2.05 (*)    Hemoglobin 6.1 (*)    HCT 17.7 (*)    All other components within normal limits  COMPREHENSIVE METABOLIC PANEL - Abnormal; Notable for the following components:   Sodium 133 (*)    Glucose, Bld 139 (*)    Calcium 8.1 (*)    Total Protein 6.0 (*)    Albumin 2.8 (*)    Anion gap 4 (*)    All other components within normal limits  CBG MONITORING, ED - Abnormal; Notable for the following components:   Glucose-Capillary 117 (*)    All other components within normal limits   ____________________________________________  EKG  None ____________________________________________  RADIOLOGY  None ____________________________________________   PROCEDURES  Procedure(s) performed: No  Procedures   Critical Care performed: No ____________________________________________   INITIAL IMPRESSION / ASSESSMENT AND PLAN / ED COURSE  Pertinent labs & imaging results that were available during my care of the patient were reviewed by me and considered in my medical decision making (see chart for details).  Patient presents after an episode of low blood sugar, was given D10 by EMS in route.  Feels well currently and has no complaints.  Has hematology appointment tomorrow.  No vaginal bleeding has good endocrinology follow-up.  Insulin  pump is paused at this time  Will monitor in the emergency department to ensure glucose stability  ----------------------------------------- 3:08 PM on 10/29/2020 -----------------------------------------  Patient continues to feel well, glucose has been stable.  Offered admission however she states that because she is feeling better she feels comfortable going home.  She knows that she can follow-up closely with her endocrinologist and in fact call today.  She has a hematology appointment tomorrow for iron transfusion and so does not want to have a blood transfusion today.  Her hemoglobin is not markedly different than it was 1 month ago, no blood loss      ____________________________________________   FINAL CLINICAL IMPRESSION(S) / ED DIAGNOSES  Final diagnoses:  Iron deficiency anemia, unspecified iron deficiency anemia type  Hypoglycemia  Note:  This document was prepared using Dragon voice recognition software and may include unintentional dictation errors.   Jene Every, MD 10/29/20 [phone]

## 2020-10-29 NOTE — ED Triage Notes (Addendum)
Pt from home via EMS for hypoglycemia. Pt has insulin pump, her husband paused it prior to EMS arrival to their home. She was seen at [facility] yesterday with the same complaint. EMS does not report a low but reports most recent bgl of 177 after infusing of D10 in route. Pt is awake and follows commands with no verbal response. Pt is currently pregnant.

## 2020-10-30 ENCOUNTER — Inpatient Hospital Stay: Payer: Medicaid Other | Admitting: Nurse Practitioner

## 2020-10-30 ENCOUNTER — Inpatient Hospital Stay: Payer: Medicaid Other

## 2020-10-30 DIAGNOSIS — R112 Nausea with vomiting, unspecified: Secondary | ICD-10-CM | POA: Diagnosis not present

## 2020-10-30 DIAGNOSIS — Z331 Pregnant state, incidental: Secondary | ICD-10-CM | POA: Diagnosis not present

## 2020-10-30 DIAGNOSIS — Z3A18 18 weeks gestation of pregnancy: Secondary | ICD-10-CM | POA: Diagnosis not present

## 2020-10-30 DIAGNOSIS — E10649 Type 1 diabetes mellitus with hypoglycemia without coma: Secondary | ICD-10-CM | POA: Diagnosis not present

## 2020-10-30 DIAGNOSIS — R638 Other symptoms and signs concerning food and fluid intake: Secondary | ICD-10-CM | POA: Diagnosis not present

## 2020-10-30 DIAGNOSIS — O24012 Pre-existing diabetes mellitus, type 1, in pregnancy, second trimester: Secondary | ICD-10-CM | POA: Diagnosis not present

## 2020-10-30 DIAGNOSIS — E162 Hypoglycemia, unspecified: Secondary | ICD-10-CM | POA: Diagnosis not present

## 2020-10-30 DIAGNOSIS — R739 Hyperglycemia, unspecified: Secondary | ICD-10-CM | POA: Diagnosis not present

## 2020-10-30 DIAGNOSIS — O99012 Anemia complicating pregnancy, second trimester: Secondary | ICD-10-CM | POA: Diagnosis not present

## 2020-10-30 DIAGNOSIS — D649 Anemia, unspecified: Secondary | ICD-10-CM | POA: Diagnosis not present

## 2020-10-30 DIAGNOSIS — Z9641 Presence of insulin pump (external) (internal): Secondary | ICD-10-CM | POA: Diagnosis not present

## 2020-10-30 NOTE — Progress Notes (Deleted)
Not see. Patient currently at [facility] hospitalized.

## 2020-10-31 DIAGNOSIS — E10649 Type 1 diabetes mellitus with hypoglycemia without coma: Secondary | ICD-10-CM | POA: Diagnosis not present

## 2020-10-31 DIAGNOSIS — D649 Anemia, unspecified: Secondary | ICD-10-CM | POA: Diagnosis not present

## 2020-10-31 DIAGNOSIS — Z3A18 18 weeks gestation of pregnancy: Secondary | ICD-10-CM | POA: Diagnosis not present

## 2020-10-31 DIAGNOSIS — R112 Nausea with vomiting, unspecified: Secondary | ICD-10-CM | POA: Diagnosis not present

## 2020-10-31 DIAGNOSIS — Z9641 Presence of insulin pump (external) (internal): Secondary | ICD-10-CM | POA: Diagnosis not present

## 2020-10-31 DIAGNOSIS — R638 Other symptoms and signs concerning food and fluid intake: Secondary | ICD-10-CM | POA: Diagnosis not present

## 2020-10-31 DIAGNOSIS — Z331 Pregnant state, incidental: Secondary | ICD-10-CM | POA: Diagnosis not present

## 2020-10-31 DIAGNOSIS — E108 Type 1 diabetes mellitus with unspecified complications: Secondary | ICD-10-CM | POA: Diagnosis not present

## 2020-11-04 ENCOUNTER — Telehealth: Payer: Self-pay | Admitting: Obstetrics and Gynecology

## 2020-11-04 DIAGNOSIS — O24012 Pre-existing diabetes mellitus, type 1, in pregnancy, second trimester: Secondary | ICD-10-CM | POA: Diagnosis not present

## 2020-11-04 NOTE — Telephone Encounter (Signed)
Patient called in and states she has been in and out of the hospital lately due to low blood sugars.  Patient states she is on 3 different nausea medications but hasn't started taking any of them because she is not sure if she should since she is pregnant.  Patient would like a call back to discuss medications and see which ones she can and can't take.   Please advise.

## 2020-11-04 NOTE — Telephone Encounter (Signed)
Please advise. Dr. Valentino Saxon seen the patient last.

## 2020-11-05 ENCOUNTER — Other Ambulatory Visit: Payer: Self-pay

## 2020-11-05 NOTE — Telephone Encounter (Signed)
Pt called no answer LM via Vm to call the office to go over the medication that she currently taking for n/v. Also sent pt some information via mychart.

## 2020-11-06 DIAGNOSIS — Z4681 Encounter for fitting and adjustment of insulin pump: Secondary | ICD-10-CM | POA: Diagnosis not present

## 2020-11-06 DIAGNOSIS — O24419 Gestational diabetes mellitus in pregnancy, unspecified control: Secondary | ICD-10-CM | POA: Diagnosis not present

## 2020-11-06 NOTE — Telephone Encounter (Signed)
Pt called back to the office and went over medication that she had for n/v and added the medication to pt's medication list. Pt was informed that all medication that she had and was prescribed was safe to take.

## 2020-11-07 ENCOUNTER — Inpatient Hospital Stay: Admission: RE | Admit: 2020-11-07 | Payer: Medicaid Other | Source: Ambulatory Visit

## 2020-11-11 ENCOUNTER — Ambulatory Visit: Payer: Medicaid Other | Attending: Maternal & Fetal Medicine

## 2020-11-11 ENCOUNTER — Other Ambulatory Visit: Payer: Self-pay

## 2020-11-11 VITALS — BP 126/80 | HR 92 | Temp 98.3°F | Resp 18 | Ht 61.0 in | Wt 129.0 lb

## 2020-11-11 DIAGNOSIS — O24012 Pre-existing diabetes mellitus, type 1, in pregnancy, second trimester: Secondary | ICD-10-CM | POA: Diagnosis not present

## 2020-11-11 DIAGNOSIS — E109 Type 1 diabetes mellitus without complications: Secondary | ICD-10-CM

## 2020-11-11 DIAGNOSIS — O09891 Supervision of other high risk pregnancies, first trimester: Secondary | ICD-10-CM

## 2020-11-11 DIAGNOSIS — O99212 Obesity complicating pregnancy, second trimester: Secondary | ICD-10-CM | POA: Diagnosis not present

## 2020-11-11 DIAGNOSIS — Z3A20 20 weeks gestation of pregnancy: Secondary | ICD-10-CM | POA: Diagnosis not present

## 2020-11-11 DIAGNOSIS — Z363 Encounter for antenatal screening for malformations: Secondary | ICD-10-CM

## 2020-11-11 LAB — GLUCOSE, CAPILLARY: Glucose-Capillary: 77 mg/dL (ref 70–99)

## 2020-11-13 ENCOUNTER — Other Ambulatory Visit
Admission: RE | Admit: 2020-11-13 | Discharge: 2020-11-13 | Disposition: A | Discharge status: Home / Self Care | Payer: Medicaid Other | Source: Ambulatory Visit | Attending: Maternal & Fetal Medicine | Admitting: Maternal & Fetal Medicine

## 2020-11-13 ENCOUNTER — Ambulatory Visit: Payer: Medicaid Other | Attending: Maternal & Fetal Medicine

## 2020-11-13 ENCOUNTER — Other Ambulatory Visit: Payer: Self-pay

## 2020-11-13 DIAGNOSIS — Z363 Encounter for antenatal screening for malformations: Secondary | ICD-10-CM | POA: Insufficient documentation

## 2020-11-14 LAB — INFECT DISEASE AB IGM REFLEX 1

## 2020-11-14 LAB — CMV IGM: CMV IgM: <30 AU/mL (ref 0.0–29.9)

## 2020-11-14 LAB — TOXOPLASMA GONDII ANTIBODY, IGM: Toxoplasma Antibody- IgM: <3 AU/mL (ref 0.0–7.9)

## 2020-11-14 LAB — TOXOPLASMA GONDII ANTIBODY, IGG: Toxoplasma IgG Ratio: <3 IU/mL (ref 0.0–7.1)

## 2020-11-14 LAB — CMV ANTIBODY, IGG (EIA): CMV Ab - IgG: 3.2 U/mL — ABNORMAL HIGH (ref 0.00–0.59)

## 2020-11-18 DIAGNOSIS — O24012 Pre-existing diabetes mellitus, type 1, in pregnancy, second trimester: Secondary | ICD-10-CM | POA: Diagnosis not present

## 2020-11-19 ENCOUNTER — Inpatient Hospital Stay
Admission: EM | Admit: 2020-11-19 | Discharge: 2020-11-24 | DRG: 831 | Disposition: A | Discharge status: Other Acute Inpatient Hospital | Payer: Medicaid Other | Attending: Obstetrics and Gynecology | Admitting: Obstetrics and Gynecology

## 2020-11-19 DIAGNOSIS — K3184 Gastroparesis: Secondary | ICD-10-CM | POA: Diagnosis present

## 2020-11-19 DIAGNOSIS — D508 Other iron deficiency anemias: Secondary | ICD-10-CM

## 2020-11-19 DIAGNOSIS — E60 Dietary zinc deficiency: Secondary | ICD-10-CM

## 2020-11-19 DIAGNOSIS — E1043 Type 1 diabetes mellitus with diabetic autonomic (poly)neuropathy: Secondary | ICD-10-CM | POA: Diagnosis present

## 2020-11-19 DIAGNOSIS — Z3A21 21 weeks gestation of pregnancy: Secondary | ICD-10-CM

## 2020-11-19 DIAGNOSIS — O26832 Pregnancy related renal disease, second trimester: Secondary | ICD-10-CM | POA: Diagnosis not present

## 2020-11-19 DIAGNOSIS — E10649 Type 1 diabetes mellitus with hypoglycemia without coma: Secondary | ICD-10-CM | POA: Diagnosis present

## 2020-11-19 DIAGNOSIS — O219 Vomiting of pregnancy, unspecified: Secondary | ICD-10-CM | POA: Diagnosis present

## 2020-11-19 DIAGNOSIS — N179 Acute kidney failure, unspecified: Secondary | ICD-10-CM | POA: Diagnosis not present

## 2020-11-19 DIAGNOSIS — O99112 Other diseases of the blood and blood-forming organs and certain disorders involving the immune mechanism complicating pregnancy, second trimester: Secondary | ICD-10-CM | POA: Diagnosis present

## 2020-11-19 DIAGNOSIS — R0602 Shortness of breath: Secondary | ICD-10-CM

## 2020-11-19 DIAGNOSIS — A419 Sepsis, unspecified organism: Secondary | ICD-10-CM | POA: Diagnosis present

## 2020-11-19 DIAGNOSIS — N189 Chronic kidney disease, unspecified: Secondary | ICD-10-CM

## 2020-11-19 DIAGNOSIS — E538 Deficiency of other specified B group vitamins: Secondary | ICD-10-CM

## 2020-11-19 DIAGNOSIS — O99012 Anemia complicating pregnancy, second trimester: Secondary | ICD-10-CM | POA: Diagnosis present

## 2020-11-19 DIAGNOSIS — M7989 Other specified soft tissue disorders: Secondary | ICD-10-CM

## 2020-11-19 DIAGNOSIS — D649 Anemia, unspecified: Secondary | ICD-10-CM

## 2020-11-19 DIAGNOSIS — O24012 Pre-existing diabetes mellitus, type 1, in pregnancy, second trimester: Principal | ICD-10-CM | POA: Diagnosis present

## 2020-11-19 DIAGNOSIS — D509 Iron deficiency anemia, unspecified: Secondary | ICD-10-CM | POA: Diagnosis present

## 2020-11-19 DIAGNOSIS — R609 Edema, unspecified: Secondary | ICD-10-CM

## 2020-11-19 DIAGNOSIS — Z659 Problem related to unspecified psychosocial circumstances: Secondary | ICD-10-CM

## 2020-11-19 DIAGNOSIS — J189 Pneumonia, unspecified organism: Secondary | ICD-10-CM

## 2020-11-19 DIAGNOSIS — R34 Anuria and oliguria: Secondary | ICD-10-CM

## 2020-11-19 DIAGNOSIS — O99512 Diseases of the respiratory system complicating pregnancy, second trimester: Secondary | ICD-10-CM | POA: Diagnosis present

## 2020-11-19 DIAGNOSIS — Z20822 Contact with and (suspected) exposure to covid-19: Secondary | ICD-10-CM | POA: Diagnosis present

## 2020-11-19 DIAGNOSIS — O99519 Diseases of the respiratory system complicating pregnancy, unspecified trimester: Secondary | ICD-10-CM

## 2020-11-19 DIAGNOSIS — D696 Thrombocytopenia, unspecified: Secondary | ICD-10-CM | POA: Diagnosis present

## 2020-11-19 DIAGNOSIS — R079 Chest pain, unspecified: Secondary | ICD-10-CM

## 2020-11-19 DIAGNOSIS — Z609 Problem related to social environment, unspecified: Secondary | ICD-10-CM

## 2020-11-19 DIAGNOSIS — Z794 Long term (current) use of insulin: Secondary | ICD-10-CM

## 2020-11-19 DIAGNOSIS — O212 Late vomiting of pregnancy: Secondary | ICD-10-CM | POA: Diagnosis present

## 2020-11-19 DIAGNOSIS — O24912 Unspecified diabetes mellitus in pregnancy, second trimester: Secondary | ICD-10-CM

## 2020-11-19 DIAGNOSIS — Z9641 Presence of insulin pump (external) (internal): Secondary | ICD-10-CM | POA: Diagnosis present

## 2020-11-19 DIAGNOSIS — O34219 Maternal care for unspecified type scar from previous cesarean delivery: Secondary | ICD-10-CM | POA: Diagnosis present

## 2020-11-19 LAB — CYSTIC FIBROSIS MUTATION 97: Interpretation: NOT DETECTED

## 2020-11-19 LAB — GLUCOSE, CAPILLARY: Glucose-Capillary: 37 mg/dL — CL (ref 70–99)

## 2020-11-20 ENCOUNTER — Other Ambulatory Visit: Payer: Self-pay

## 2020-11-20 ENCOUNTER — Encounter: Payer: Medicaid Other | Admitting: Obstetrics and Gynecology

## 2020-11-20 ENCOUNTER — Encounter: Payer: Self-pay | Admitting: Obstetrics and Gynecology

## 2020-11-20 ENCOUNTER — Telehealth: Payer: Self-pay | Admitting: Obstetrics and Gynecology

## 2020-11-20 DIAGNOSIS — R079 Chest pain, unspecified: Secondary | ICD-10-CM

## 2020-11-20 DIAGNOSIS — E538 Deficiency of other specified B group vitamins: Secondary | ICD-10-CM | POA: Diagnosis not present

## 2020-11-20 DIAGNOSIS — D649 Anemia, unspecified: Secondary | ICD-10-CM | POA: Diagnosis not present

## 2020-11-20 DIAGNOSIS — O99012 Anemia complicating pregnancy, second trimester: Secondary | ICD-10-CM | POA: Diagnosis present

## 2020-11-20 DIAGNOSIS — O34219 Maternal care for unspecified type scar from previous cesarean delivery: Secondary | ICD-10-CM | POA: Diagnosis present

## 2020-11-20 DIAGNOSIS — O24012 Pre-existing diabetes mellitus, type 1, in pregnancy, second trimester: Secondary | ICD-10-CM | POA: Diagnosis not present

## 2020-11-20 DIAGNOSIS — N179 Acute kidney failure, unspecified: Secondary | ICD-10-CM | POA: Diagnosis not present

## 2020-11-20 DIAGNOSIS — R0602 Shortness of breath: Secondary | ICD-10-CM | POA: Diagnosis not present

## 2020-11-20 DIAGNOSIS — Z659 Problem related to unspecified psychosocial circumstances: Secondary | ICD-10-CM

## 2020-11-20 DIAGNOSIS — E60 Dietary zinc deficiency: Secondary | ICD-10-CM | POA: Diagnosis not present

## 2020-11-20 DIAGNOSIS — O219 Vomiting of pregnancy, unspecified: Secondary | ICD-10-CM | POA: Diagnosis not present

## 2020-11-20 DIAGNOSIS — D508 Other iron deficiency anemias: Secondary | ICD-10-CM | POA: Diagnosis not present

## 2020-11-20 DIAGNOSIS — Z3A21 21 weeks gestation of pregnancy: Secondary | ICD-10-CM | POA: Diagnosis not present

## 2020-11-20 DIAGNOSIS — R34 Anuria and oliguria: Secondary | ICD-10-CM | POA: Diagnosis not present

## 2020-11-20 LAB — CBC WITH DIFFERENTIAL/PLATELET
Abs Immature Granulocytes: 0.01 10*3/uL (ref 0.00–0.07)
Basophils Absolute: 0 10*3/uL (ref 0.0–0.1)
Basophils Relative: 0 %
Eosinophils Absolute: 0 10*3/uL (ref 0.0–0.5)
Eosinophils Relative: 0 %
HCT: 17.5 % — ABNORMAL LOW (ref 36.0–46.0)
Hemoglobin: 6.1 g/dL — ABNORMAL LOW (ref 12.0–15.0)
Immature Granulocytes: 0 %
Lymphocytes Relative: 13 %
Lymphs Abs: 0.4 10*3/uL — ABNORMAL LOW (ref 0.7–4.0)
MCH: 31 pg (ref 26.0–34.0)
MCHC: 34.9 g/dL (ref 30.0–36.0)
MCV: 88.8 fL (ref 80.0–100.0)
Monocytes Absolute: 0.1 10*3/uL (ref 0.1–1.0)
Monocytes Relative: 4 %
Neutro Abs: 2.5 10*3/uL (ref 1.7–7.7)
Neutrophils Relative %: 83 %
Platelets: 149 10*3/uL — ABNORMAL LOW (ref 150–400)
RBC: 1.97 MIL/uL — ABNORMAL LOW (ref 3.87–5.11)
RDW: 14 % (ref 11.5–15.5)
WBC: 3 10*3/uL — ABNORMAL LOW (ref 4.0–10.5)
nRBC: 0 % (ref 0.0–0.2)

## 2020-11-20 LAB — RETIC PANEL
Immature Retic Fract: 14.5 % (ref 2.3–15.9)
RBC.: 2.11 MIL/uL — ABNORMAL LOW (ref 3.87–5.11)
Retic Count, Absolute: 55.9 10*3/uL (ref 19.0–186.0)
Retic Ct Pct: 2.7 % (ref 0.4–3.1)
Reticulocyte Hemoglobin: 35.8 pg (ref >27.9)

## 2020-11-20 LAB — COMPREHENSIVE METABOLIC PANEL
ALT: 12 U/L (ref 0–44)
AST: 27 U/L (ref 15–41)
Albumin: 2.5 g/dL — ABNORMAL LOW (ref 3.5–5.0)
Alkaline Phosphatase: 42 U/L (ref 38–126)
Anion gap: 9 (ref 5–15)
BUN: 10 mg/dL (ref 6–20)
CO2: 19 mmol/L — ABNORMAL LOW (ref 22–32)
Calcium: 7.9 mg/dL — ABNORMAL LOW (ref 8.9–10.3)
Chloride: 106 mmol/L (ref 98–111)
Creatinine, Ser: 1.14 mg/dL — ABNORMAL HIGH (ref 0.44–1.00)
GFR, Estimated: >60 mL/min (ref >60)
Glucose, Bld: 339 mg/dL — ABNORMAL HIGH (ref 70–99)
Potassium: 4.4 mmol/L (ref 3.5–5.1)
Sodium: 134 mmol/L — ABNORMAL LOW (ref 135–145)
Total Bilirubin: 0.9 mg/dL (ref 0.3–1.2)
Total Protein: 5.6 g/dL — ABNORMAL LOW (ref 6.5–8.1)

## 2020-11-20 LAB — GLUCOSE, RANDOM: Glucose, Bld: 263 mg/dL — ABNORMAL HIGH (ref 70–99)

## 2020-11-20 LAB — GLUCOSE, CAPILLARY
Glucose-Capillary: 150 mg/dL — ABNORMAL HIGH (ref 70–99)
Glucose-Capillary: 152 mg/dL — ABNORMAL HIGH (ref 70–99)
Glucose-Capillary: 229 mg/dL — ABNORMAL HIGH (ref 70–99)
Glucose-Capillary: 234 mg/dL — ABNORMAL HIGH (ref 70–99)
Glucose-Capillary: 243 mg/dL — ABNORMAL HIGH (ref 70–99)
Glucose-Capillary: 27 mg/dL — CL (ref 70–99)
Glucose-Capillary: 291 mg/dL — ABNORMAL HIGH (ref 70–99)
Glucose-Capillary: 34 mg/dL — CL (ref 70–99)
Glucose-Capillary: 340 mg/dL — ABNORMAL HIGH (ref 70–99)
Glucose-Capillary: 90 mg/dL (ref 70–99)

## 2020-11-20 LAB — FERRITIN: Ferritin: 626 ng/mL — ABNORMAL HIGH (ref 11–307)

## 2020-11-20 LAB — PREPARE RBC (CROSSMATCH)

## 2020-11-20 LAB — IRON AND TIBC
Iron: 23 ug/dL — ABNORMAL LOW (ref 28–170)
Saturation Ratios: 10 % — ABNORMAL LOW (ref 10.4–31.8)
TIBC: 228 ug/dL — ABNORMAL LOW (ref 250–450)
UIBC: 205 ug/dL

## 2020-11-20 LAB — RESP PANEL BY RT-PCR (FLU A&B, COVID) ARPGX2
Influenza A by PCR: NEGATIVE
Influenza B by PCR: NEGATIVE
SARS Coronavirus 2 by RT PCR: NEGATIVE

## 2020-11-20 LAB — HEMOGLOBIN AND HEMATOCRIT, BLOOD
HCT: 21.1 % — ABNORMAL LOW (ref 36.0–46.0)
Hemoglobin: 7.3 g/dL — ABNORMAL LOW (ref 12.0–15.0)

## 2020-11-20 LAB — PATHOLOGIST SMEAR REVIEW

## 2020-11-20 LAB — ABO/RH: ABO/RH(D): A POS

## 2020-11-20 MED ORDER — ALUM & MAG HYDROXIDE-SIMETH 200-200-20 MG/5ML PO SUSP
30.0000 mL | Freq: Once | ORAL | Status: AC
  Administered 2020-11-20: 30 mL via ORAL

## 2020-11-20 MED ORDER — SODIUM CHLORIDE 0.9 % IV BOLUS
500.0000 mL | Freq: Once | INTRAVENOUS | Status: AC
  Administered 2020-11-20: 500 mL via INTRAVENOUS

## 2020-11-20 MED ORDER — ACETAMINOPHEN 650 MG RE SUPP
650.0000 mg | RECTAL | Status: DC | PRN
  Administered 2020-11-20 – 2020-11-24 (×4): 650 mg via RECTAL
  Filled 2020-11-20 (×9): qty 1

## 2020-11-20 MED ORDER — LIDOCAINE VISCOUS HCL 2 % MT SOLN
15.0000 mL | Freq: Once | OROMUCOSAL | Status: AC
  Administered 2020-11-20: 15 mL via ORAL
  Filled 2020-11-20: qty 15

## 2020-11-20 MED ORDER — ALUM & MAG HYDROXIDE-SIMETH 200-200-20 MG/5ML PO SUSP
30.0000 mL | Freq: Once | ORAL | Status: AC
  Administered 2020-11-20: 30 mL via ORAL
  Filled 2020-11-20: qty 30

## 2020-11-20 MED ORDER — ACETAMINOPHEN 325 MG PO TABS
650.0000 mg | ORAL_TABLET | ORAL | Status: DC | PRN
  Filled 2020-11-20: qty 2

## 2020-11-20 MED ORDER — DICYCLOMINE HCL 10 MG/5ML PO SOLN
10.0000 mg | Freq: Once | ORAL | Status: AC
  Administered 2020-11-20: 10 mg via ORAL
  Filled 2020-11-20 (×2): qty 5

## 2020-11-20 MED ORDER — DEXTROSE-NACL 5-0.45 % IV SOLN: INTRAVENOUS | Status: DC

## 2020-11-20 MED ORDER — ZOLPIDEM TARTRATE 5 MG PO TABS: 5.0000 mg | ORAL_TABLET | Freq: Every evening | ORAL | Status: DC | PRN

## 2020-11-20 MED ORDER — ACETAMINOPHEN 325 MG PO TABS: 650.0000 mg | ORAL_TABLET | ORAL | Status: DC | PRN

## 2020-11-20 MED ORDER — METOCLOPRAMIDE HCL 5 MG/ML IJ SOLN
10.0000 mg | Freq: Four times a day (QID) | INTRAMUSCULAR | Status: DC
  Administered 2020-11-20 – 2020-11-24 (×13): 10 mg via INTRAVENOUS
  Filled 2020-11-20 (×13): qty 2

## 2020-11-20 MED ORDER — DEXTROSE-NACL 5-0.9 % IV SOLN: INTRAVENOUS | Status: DC

## 2020-11-20 MED ORDER — LACTATED RINGERS IV SOLN: INTRAVENOUS | Status: DC

## 2020-11-20 MED ORDER — SODIUM CHLORIDE 0.9% IV SOLUTION: Freq: Once | INTRAVENOUS | Status: AC

## 2020-11-20 MED ORDER — GLUCOSE 4 G PO CHEW
3.0000 | CHEWABLE_TABLET | Freq: Once | ORAL | Status: AC | PRN
  Administered 2020-11-20: 12 g via ORAL

## 2020-11-20 MED ORDER — DEXTROSE 5 % IN LACTATED RINGERS IV BOLUS
1000.0000 mL | INTRAVENOUS | Status: AC
  Administered 2020-11-20: 1000 mL via INTRAVENOUS

## 2020-11-20 MED ORDER — PRENATAL MULTIVITAMIN CH
1.0000 | ORAL_TABLET | Freq: Every day | ORAL | Status: DC
  Filled 2020-11-20: qty 1

## 2020-11-20 MED ORDER — DEXTROSE 5 % AND 0.45 % NACL IV BOLUS
1000.0000 mL | Freq: Once | INTRAVENOUS | Status: AC
  Administered 2020-11-20: 1000 mL via INTRAVENOUS

## 2020-11-20 MED ORDER — INSULIN PUMP
SUBCUTANEOUS | Status: DC
  Administered 2020-11-23: 1.4 via SUBCUTANEOUS
  Administered 2020-11-23: 0.8 via SUBCUTANEOUS
  Administered 2020-11-23: 0.6 via SUBCUTANEOUS
  Administered 2020-11-24: 0.65 via SUBCUTANEOUS
  Filled 2020-11-20: qty 1

## 2020-11-20 MED ORDER — CALCIUM CARBONATE ANTACID 500 MG PO CHEW
2.0000 | CHEWABLE_TABLET | ORAL | Status: DC | PRN
  Administered 2020-11-20 – 2020-11-24 (×2): 400 mg via ORAL
  Filled 2020-11-20 (×2): qty 2

## 2020-11-20 MED ORDER — SUCRALFATE 1 GM/10ML PO SUSP
1.0000 g | Freq: Three times a day (TID) | ORAL | Status: DC
  Administered 2020-11-20: 1 g via ORAL
  Filled 2020-11-20 (×2): qty 10

## 2020-11-20 MED ORDER — SODIUM CHLORIDE 0.9 % IV SOLN
200.0000 mg | Freq: Once | INTRAVENOUS | Status: AC
  Administered 2020-11-21: 200 mg via INTRAVENOUS
  Filled 2020-11-20: qty 10

## 2020-11-20 MED ORDER — SODIUM CHLORIDE 0.9 % IV SOLN
25.0000 mg | Freq: Four times a day (QID) | INTRAVENOUS | Status: DC | PRN
  Administered 2020-11-20 – 2020-11-24 (×10): 25 mg via INTRAVENOUS
  Filled 2020-11-20: qty 1
  Filled 2020-11-20: qty 25
  Filled 2020-11-20: qty 1
  Filled 2020-11-20: qty 25
  Filled 2020-11-20: qty 1
  Filled 2020-11-20: qty 25
  Filled 2020-11-20 (×4): qty 1

## 2020-11-20 MED ORDER — DEXTROSE IN LACTATED RINGERS 5 % IV SOLN: INTRAVENOUS | Status: DC

## 2020-11-20 NOTE — Progress Notes (Signed)
Pt restarted personal Insulin Pump at 0930. Patient signed and dated contract for insulin pump therapy, witnessed by RN, and placed in pt's chart. Insulin pump flowsheet at bedside for pt use, instructions given on how to update the paper flowsheet. Pt verbalized understanding.

## 2020-11-20 NOTE — Progress Notes (Signed)
BS 34, bolus D5NS.45, recheck in 20 min

## 2020-11-20 NOTE — OB Triage Note (Signed)
Notified Dr. [name] of CBG of 229. Telephone order given to modify rate of D5LR fluid to 146ml/hr continuously and to stop all PO food for now. New order given for promethazine. See orders.

## 2020-11-20 NOTE — Progress Notes (Signed)
Provider was contacted, see orders, verbally verified D5NS.45, recheck CBG 20 min post infusions and gluclose tablets(3), leave pts insulin pump running, contact provider with results

## 2020-11-20 NOTE — Consult Note (Signed)
Hematology/Oncology Consult note [facility] Telephone:([phone] [phone]  Patient Care Team: Hildred Laser, MD as PCP - General (Obstetrics and Gynecology)   Name of the patient: Ashley Camacho  [id number]  November 24, 1990   Date of visit: 11/20/20 REASON FOR COSULTATION:  Anemia in pregnancy History of presenting illness-  30 y.o. female with PMH listed at below who presents to ER for evaluation of nausea vomiting.  Patient has history of brittle type 1 diabetes and gastroparesis.  Has not had anything to eat for 2 to 3 days.  She tried multiple antiemetics without relief of her symptoms.  She has type 1 diabetes, half an insulin pump.  Recently she reports having low blood glucose readings and was recently seen by [facility] emergency room department for hypoglycemia.  She has been seen by [facility] endocrinology. Hematology was consulted due to anemia with hemoglobin 6.1.  No bleeding history.  Patient is currently 21 weeks pregnancy.  Reviewed patient's previous lab work.  Anemia is acute on chronic,  For hemoglobin has further decreased recently since May 2022. 10/12/2020, hemoglobin 7.4, MCV 84.5.  TIBC 249, iron 32, iron saturation 13 10/30/2020, hemoglobin 6.1, ferritin 11.8, vitamin B12 380 10/31/2020 hemoglobin 6.3. Patient reports not tolerating oral iron supplementation which caused her nausea vomiting symptoms worse. 10/29/2020-10/31/2020 patient was admitted to [facility] and he received 1 dose of IV dextran.  She has not follow-up with hematology outpatient for additional IV iron treatments.  I discussed with Dr. [name] and the patient has received 1 unit of PRBC blood transfusion. Denies any vaginal bleeding, rectal bleeding or black tarry stool.  Review of Systems  Constitutional:  Positive for appetite change, fatigue and unexpected weight change.  Eyes:  Negative for eye problems.  Respiratory:  Negative for shortness of breath.   Gastrointestinal:  Positive for  nausea and vomiting. Negative for abdominal pain.  Genitourinary:  Negative for difficulty urinating and dysuria.   Musculoskeletal:  Negative for back pain.  Skin:  Negative for rash.  Hematological:  Negative for adenopathy.  Psychiatric/Behavioral:  Negative for confusion.    No Known Allergies  Patient Active Problem List   Diagnosis Date Noted   Indication for care in labor and delivery, antepartum 11/20/2020   Nausea and vomiting during pregnancy 10/23/2020   Adjustment disorder with mixed anxiety and depressed mood 05/14/2016   DKA (diabetic ketoacidoses) 04/03/2016     Past Medical History:  Diagnosis Date   Anemia    Diabetes mellitus without complication (HCC)    Gastroparesis    Hypertension    Type 1 diabetes (HCC)      Past Surgical History:  Procedure Laterality Date   CESAREAN SECTION     MOUTH SURGERY      Social History   Socioeconomic History   Marital status: Single    Spouse name: Not on file   Number of children: Not on file   Years of education: Not on file   Highest education level: Not on file  Occupational History   Occupation: home maker  Tobacco Use   Smoking status: Never   Smokeless tobacco: Never  Vaping Use   Vaping Use: Never used  Substance and Sexual Activity   Alcohol use: No   Drug use: No   Sexual activity: Yes    Birth control/protection: None  Other Topics Concern   Not on file  Social History Narrative   Not on file   Social Determinants of Corporate investment banker  Strain: Not on file  Food Insecurity: Not on file  Transportation Needs: Not on file  Physical Activity: Not on file  Stress: Not on file  Social Connections: Not on file  Intimate Partner Violence: Not on file     Family History  Problem Relation Age of Onset   Diabetes type I Father    CAD Father    CAD Paternal Grandmother    CAD Paternal Grandfather    Breast cancer Mother    Ovarian cancer Neg Hx      Current  Facility-Administered Medications:    acetaminophen (TYLENOL) tablet 650 mg, 650 mg, Oral, Q4H PRN **OR** acetaminophen (TYLENOL) suppository 650 mg, 650 mg, Rectal, Q4H PRN, Doroteo Glassmanhompson, Amy C, RPH, 650 mg at 11/20/20 1154   calcium carbonate (TUMS - dosed in mg elemental calcium) chewable tablet 400 mg of elemental calcium, 2 tablet, Oral, Q4H PRN, Linzie CollinEvans, David James, MD, 400 mg of elemental calcium at 11/20/20 0925   dextrose 5 % in lactated ringers infusion, , Intravenous, Continuous, Linzie CollinEvans, David James, MD, Stopped at 11/20/20 1200   insulin pump, , Subcutaneous, Q4H, Linzie CollinEvans, David James, MD, Given at 11/20/20 1600   lactated ringers infusion, , Intravenous, Continuous, Linzie CollinEvans, David James, MD, Last Rate: 100 mL/hr at 11/20/20 1606, Restarted at 11/20/20 1606   prenatal multivitamin tablet 1 tablet, 1 tablet, Oral, Q1200, Linzie CollinEvans, David James, MD   promethazine (PHENERGAN) 25 mg in sodium chloride 0.9 % 50 mL IVPB, 25 mg, Intravenous, Q6H PRN, Linzie CollinEvans, David James, MD, Last Rate: 200 mL/hr at 11/20/20 1712, 25 mg at 11/20/20 1712   sucralfate (CARAFATE) 1 GM/10ML suspension 1 g, 1 g, Oral, TID, Hildred Laserherry, Anika, MD, 1 g at 11/20/20 1710   zolpidem (AMBIEN) tablet 5 mg, 5 mg, Oral, QHS PRN, Linzie CollinEvans, David James, MD   Physical exam:  Vitals:   11/20/20 1344 11/20/20 1416 11/20/20 1449 11/20/20 1600  BP: 119/64 118/69 112/68 113/68  Pulse: (!) 105 (!) 105 100 97  Resp: 16 16 16 16   Temp:  98.3 F (36.8 C) 98 F (36.7 C) 97.9 F (36.6 C)  TempSrc:  Oral Oral   Weight:      Height:       Physical Exam Constitutional:      General: She is not in acute distress.    Appearance: She is not diaphoretic.  HENT:     Head: Normocephalic and atraumatic.     Nose: Nose normal.     Mouth/Throat:     Pharynx: No oropharyngeal exudate.  Eyes:     General: No scleral icterus.    Pupils: Pupils are equal, round, and reactive to light.  Cardiovascular:     Rate and Rhythm: Normal rate and regular rhythm.      Heart sounds: No murmur heard. Pulmonary:     Effort: Pulmonary effort is normal. No respiratory distress.     Breath sounds: No rales.  Chest:     Chest wall: No tenderness.  Abdominal:     General: There is no distension.     Palpations: Abdomen is soft.     Tenderness: There is no abdominal tenderness.     Comments: Gravid uterus  Musculoskeletal:        General: Normal range of motion.     Cervical back: Normal range of motion and neck supple.  Skin:    General: Skin is warm and dry.     Coloration: Skin is pale.     Findings: No erythema.  Neurological:     Mental Status: She is alert and oriented to person, place, and time.     Cranial Nerves: No cranial nerve deficit.     Motor: No abnormal muscle tone.     Coordination: Coordination normal.  Psychiatric:        Mood and Affect: Affect normal.        CMP Latest Ref Rng & Units 11/20/2020  Glucose 70 - 99 mg/dL 191(Y)  BUN 6 - 20 mg/dL 10  Creatinine 7.82 - 9.56 mg/dL 2.13(Y)  Sodium 865 - 784 mmol/L 134(L)  Potassium 3.5 - 5.1 mmol/L 4.4  Chloride 98 - 111 mmol/L 106  CO2 22 - 32 mmol/L 19(L)  Calcium 8.9 - 10.3 mg/dL 7.9(L)  Total Protein 6.5 - 8.1 g/dL 6.9(G)  Total Bilirubin 0.3 - 1.2 mg/dL 0.9  Alkaline Phos 38 - 126 U/L 42  AST 15 - 41 U/L 27  ALT 0 - 44 U/L 12   CBC Latest Ref Rng & Units 11/20/2020  WBC 4.0 - 10.5 K/uL -  Hemoglobin 12.0 - 15.0 g/dL 7.3(L)  Hematocrit 36.0 - 46.0 % 21.1(L)  Platelets 150 - 400 K/uL -    RADIOGRAPHIC STUDIES: I have personally reviewed the radiological images as listed and agreed with the findings in the report. [place] MFM OB COMP + 14 WK  Result Date: 11/13/2020 ----------------------------------------------------------------------  OBSTETRICS REPORT                       (Signed Final 11/13/2020 09:13 am) ---------------------------------------------------------------------- Patient Info  ID #:       [id number]                          D.O.B.:  1990-06-29 (29 yrs)   Name:       NYAIRE RIGHETTI [facility]                 Visit Date: 11/11/2020 05:45 pm ---------------------------------------------------------------------- Performed By  Attending:        Lin Landsman      Ref. Address:     [facility]                    MD                                                             [facility]                                                             [address]                                                             [address]  [address]                                                             [place]                                                             [address]  Performed By:     Blair Heys      [facility]         [facility]                                                             [facility]  Referred By:      Linzie Collin MD ---------------------------------------------------------------------- Orders  #  Description                           Code        Ordered By  1  [place] MFM OB COMP + 14 WK                76805.01    DAVID EVANS ----------------------------------------------------------------------  #  Order #                     Accession #                Episode #  1  [id number]                   [id number]                 [id number] ---------------------------------------------------------------------- Indications  [redacted] weeks gestation of pregnancy                Z3A.20  Anemia during pregnancy in second trimester    O99.012  Pre-existing diabetes, type 1, in pregnancy,   O24.012  second trimester ---------------------------------------------------------------------- Fetal Evaluation  Num Of Fetuses:         1  Fetal Heart Rate(bpm):  137  Cardiac Activity:       Observed  Presentation:           Variable  Placenta:               Posterior  Amniotic Fluid  AFI FV:      Within normal limits  ---------------------------------------------------------------------- Biometry  BPD:      48.5  mm     G. Age:  20w 5d         71  %    CI:        73.74   %    70 - 86                                                          FL/HC:      18.0   %    16.8 - 19.8  HC:      179.4  mm     G. Age:  20w 3d         53  %    HC/AC:      1.19        1.09 - 1.39  AC:      150.2  mm     G. Age:  20w 2d         48  %    FL/BPD:     66.6   %  FL:       32.3  mm     G. Age:  20w 0d         39  %    FL/AC:      21.5   %    20 - 24  HUM:      31.2  mm     G. Age:  20w 3d         59  %  Est. FW:     339  gm    0 lb 12 oz      49  % ---------------------------------------------------------------------- Gestational Age  U/S Today:     20w 3d                                        EDD:   03/28/21  Best:          20w 1d     Det. By:  Marcella Dubs         EDD:   03/30/21                                      (08/27/20) ---------------------------------------------------------------------- Anatomy  Cranium:               Appears normal         Aortic Arch:            Appears normal  Cavum:                 Appears normal         Ductal Arch:            Appears normal  Ventricles:            Appears normal         Diaphragm:              Appears normal  Choroid Plexus:        Appears normal         Stomach:  Appears normal, left                                                                        sided  Cerebellum:            Appears normal         Abdomen:                Appears normal  Posterior Fossa:       Appears normal         Abdominal Wall:         Appears nml (cord                                                                        insert, abd wall)  Nuchal Fold:           Appears normal         Cord Vessels:           Appears normal (3                                                                        vessel cord)  Lips:                  Appears normal         Kidneys:                Appear normal   Thoracic:              Appears normal         Bladder:                Appears normal  Heart:                 Appears normal         Spine:                  Appears normal                         (4CH, axis, and                         situs)  RVOT:                  Appears normal         Upper Extremities:      Appears normal  LVOT:                  Appears normal         Lower Extremities:      Appears normal ----------------------------------------------------------------------  Cervix Uterus Adnexa  Cervix  Length:            3.9  cm. ---------------------------------------------------------------------- Impression  Single intrauterine pregnancy here for a detailed anatomy  due to type 1 diabetes  Ms. Sebring had a low risk NIPS and AFP.  Normal anatomy with measurements consistent with dates  There is good fetal movement and amniotic fluid volume  I reviewed today's study and recommend serial growth, fetal  echocardiogram and initiation of weekly testing at 32 weeks.  We discussed the increased risk for fetal macrosomia,  cardiac defects, maternal and fetal birth trauma with  temporary and/or permenant damage, stillbirth and neonatal  ICU admission.  In addition we discussed starting daily low dose ASA for the  prevention of preeclampsia.  She reports good blood sugars overall are better she was  having low's due to N/V. ---------------------------------------------------------------------- Recommendations  Follow up growth in 4 weeks  Fetal echocardiogram between 22-26 weeks. ----------------------------------------------------------------------               Lin Landsman, MD Electronically Signed Final Report   11/13/2020 09:13 am ----------------------------------------------------------------------   Assessment and plan-  #Acute on chronic anemia during second trimester pregnancy. Pathology smear showed no schistocytes, blasts. Reticulocyte panel showed normal reticulocyte percentage-indicated  possible underproduction. Iron saturation 10, TIBC 228, ferritin 626.  Normal bilirubin, less likely hemolysis. S/p 1 PRBC transfusion.  Posttransfusion H&H 7.3. Discussed with patient that the elevation of ferritin likely is due to ferritin being acute phase reactant.  She has also recently received IV dextran.  Iron saturation is low at 10 consistent with iron deficiency. Recommend IV Venofer 200 mg x 1. Allergy reactions/infusion reaction including anaphylactic reaction discussed with patient. Other side effects include but not limited to high blood pressure, skin rash, weight gain, leg swelling, etc. Patient voices understanding and willing to proceed.  Reticulocyte percentage is inappropriately low, MCV is not consistent with typical iron deficiency. I will check vitamin B12 and folate in a.m.  #AKI, IV hydration per OB/GYN team. #Type 1 diabetes.  And gastroparesis management per OB/GYN team.  Plan was discussed with Dr. [name].  Thank you for allowing me to participate in the care of this patient.   Rickard Patience, MD, PhD Hematology Oncology [facility] Pager- [phone] 11/20/2020

## 2020-11-20 NOTE — Progress Notes (Signed)
Antenatal Progress Note  Subjective:     Patient ID: Ashley Camacho is a 30 y.o. female [redacted]w[redacted]d , Estimated Date of Delivery: 03/30/21 by 9 week ultrasound, LMP unknown.She is a Type 1 diabetic utilizing an insulin pump and severe anemia of pregnancy who was admitted for nausea and vomiting, hypoglycemia. HD# 0. Inherited patient at change of shift from Dr. Brennan Bailey.    Hospital course to date:  S/p Hematology consult, undergoing workup for severe anemia.  Received 1 unit PRBCs this admission, Hgb improved from 6.1 to 7.3.  2. Monitoring blood sugars, have been labile all day today.  3. Social work consult due to patient reporting issues with no food for 3 days as her partner works nights and sleeps during the day. Patient does not drive.  Denies any financial issues with obtaining food as she receives federal assistance.  4. Nausea/vomiting persistent despite Phenergan IV infusion.    Subjective:  Patient reports that she has been having nausea and vomiting still.    Review of Systems Denies contractions, leakage of fluids, vaginal bleeding, and reports good fetal movement.     Objective:   Vitals:   11/20/20 1344 11/20/20 1416 11/20/20 1449 11/20/20 1600  BP: 119/64 118/69 112/68 113/68  Pulse: (!) 105 (!) 105 100 97  Resp: 16 16 16 16   Temp:  98.3 F (36.8 C) 98 F (36.7 C) 97.9 F (36.6 C)  TempSrc:  Oral Oral Oral  Weight:      Height:        General appearance: alert and no distress Lungs: clear to auscultation bilaterally Heart: regular rate and rhythm, S1, S2 normal, no murmur, click, rub or gallop Abdomen: soft, non-tender; bowel sounds normal; no masses,  no organomegaly Pelvic: deferred Extremities: extremities normal, atraumatic, no cyanosis or edema   Fetus A Non-Stress Test Interpretation for 11/20/20  Indication:  Type 1 DM  Fetal Heart Rate A Mode: Doppler Baseline Rate (A): 144 bpm (fht)  Uterine Activity Mode: Palpation Resting Tone  Palpated: Relaxed     Labs:  CBC Latest Ref Rng & Units 11/20/2020 11/20/2020  WBC 4.0 - 10.5 K/uL - 3.0(L)  Hemoglobin 12.0 - 15.0 g/dL 7.3(L) 6.1(L)  Hematocrit 36.0 - 46.0 % 21.1(L) 17.5(L)  Platelets 150 - 400 K/uL - 149(L)      Results for BEULAH, CAPOBIANCO (MRN [name]) as of 11/20/2020 08:02  Ref. Range 11/20/2020 04:13 11/20/2020 08:01 11/20/2020 08:55 11/20/2020 11:48 11/20/2020 12:18 11/20/2020 13:37 11/20/2020 13:54 11/20/2020 17:15 11/20/2020 19:35  Glucose-Capillary Latest Ref Range: 70 - 99 mg/dL 11/22/2020 (H) 536 (H)   144 (H) 291 (H)   27 (LL)   Assessment:  30 y.o. female [redacted]w[redacted]d, with:     Nausea and vomiting in pregnancy Type 1 DM Anemia affecting second trimester Social issues in pregnancy   Plan:   Nausea and vomiting in pregnancy - On Phenergan infusion. Will also add Reglan in alternating schedule to help with nausea and vomiting. Once resolved, can begin increasing dietary intake.  Patient also with h/o febrile morbidity yesterday, with COVID testing negative. Will repeat COVD testing in a.m. due to other persistent symptoms.  Type 1 DM - patient currently NPO due to nausea/vomiting.  Currently has insulin pump running, rate is 0.25 units/hr.  Titrating IV fluids to maintin a normal glucose levels.  Will restart D5LR as patient with recent hypoglycemic episode. Will also administer glucose tablets if tolerated.  Anemia affecting second trimester, s/p transfusion 1 unit PRBCs, with  improvement from 6.1 to 7.3.  Will repeat tomorrow morning to see if levels remain stable. If they continue to decrease, can transfuse another unit. Hematology following.  Social issues in pregnancy - discussed patient's issues with getting access to food.  After discussion, it was noted that she does have access to free public transportation. Advised that she can utilize this during the day when her partner is unavailable, and on days when they are able to go to get groceries, to meal plan such that  they have most needs for the week.  Social work also consulted earlier today.  Will continue to follow.

## 2020-11-20 NOTE — Progress Notes (Addendum)
Inpatient Diabetes Program Recommendations  Diabetes Treatment Program Recommendations  ADA Standards of Care 2018 Diabetes in Pregnancy Target Glucose Ranges:  Fasting: 60 - 90 mg/dL Preprandial: 60 - 503 mg/dL 1 hr postprandial: Less than 140mg /dL (from first bite of meal) 2 hr postprandial: Less than 120 mg/dL (from first bite of meal)    Results for ANNALIESA, BLANN (MRN [name]) as of 11/20/2020 08:25  Ref. Range 11/19/2020 23:53 11/20/2020 00:47 11/20/2020 01:59 11/20/2020 04:13 11/20/2020 08:01  Glucose-Capillary Latest Ref Range: 70 - 99 mg/dL 37 (LL) 11/22/2020 (H) 917 (H) 234 (H) 243 (H)     Review of Glycemic Control  Diabetes history: DM1 (makes NO insulin; requires basal, correction, and carb coverage insulin) Outpatient Diabetes medications: OmniPod insulin pump with Novolog insulin Current orders for Inpatient glycemic control: Insulin Pump Q4H  NOTE: In reviewing the chart, noted patient has Type 1 DM (makes NO insulin) and uses an insulin pump for glycemic control. Spoke with 915, RN regarding patient and was informed that she is 21 weeks 3 days gestation, came in with N/V and hypoglycemia. Insulin pump was stopped during the night. Glucose has been 229-243 over the past 6 hours and plan is for patient to restart insulin pump. Noted in Care Everywhere that patient seen Dr. Shirlean Mylar (Endocrinologist with [facility]) on 11/06/20 and A1C was 6.7% at that time. Per office visit note on 11/06/20, plan was "Increase basal rate 12AM-6AM to 0.2, 6AM-12AM to 0.3 units/hour with total basal of 6.6 units daily. If needing to wait to bolus, encouraged patient to bolus for meals by looking at sugar at time of eating." Also noted clinical support note by 11/08/20, RD/LDN on 11/18/20 in which "Current Pump Settings: "Sick day profile" is active, was created during hospitalization and has continued to be active since discharge, was last adjusted 11/06/20 during visit with Dr. 11/08/20.  12-6am 0.2 units/hour        (reduced to 0.15 units/hour) 6am-12am 0.3 units/hour (reduced to 0.25 units/hour) Total 6.6 units/24 hours of basal insulin (5.4 units/24 hours with reductions) CF 80 (1 unit drops glucose 80 mg/dl) I:C Amalia Hailey (1 unit covers 20 grams of carbs) Target BG 100" It appears that basal rates were decreased on 11/18/20 due to increased hypoglycemia. 11/20/20, RN reports that provider as ordered for patient to restart insulin pump and patient is alert enough to manage pump at this time. Discussed insulin pump order set and asked that insulin pump contract and flowsheet be printed out and have patient read and sign contract and place in medical chart. In the event that patient becomes unable to manage her insulin pump or if she has further hypoglycemia, would recommend discontinuing the insulin pump and ordering Lantus 3 units Q24H, CBGs Q4H, Novolog 0-6 units Q4H, and if patient is ordered diet and eating at least 50% of meals would recommend Novolog 2 units TID with meals. Will plan to speak with patient and follow along while inpatient.  Addendum 11/20/20@12 :40-Spoke to patient and 11/22/20, RN at bedside. Patient restarted insulin pump at 9:30 am today. Patient reports she put on a new pod (OmniPod) and a new Dexcom CGM this morning when everything was restarted. Patient states that she was told to decrease basal settings on 11/18/20 by RD but she had not taken the time to make the changes yet. Patient reported that she is having 3-4 hypoglycemic episodes daily over the past few days and that sometimes she does not wake up when she is low. She  states that she has glucagon at home but her husband usually calls 911 when she has a significant hypoglycemia in which she does not wake up. Patient states that her husband knows how to use the glucagon but does not like to do it. Patient reports that her glucose was reading High on Dexcom around 12:08 and that when she done a correction on her insulin pump it gave her 3.75  units at 12:08. Finger stick glucose 340 mg/dl at 42:70 today and RN has communicated with provider which ordered to change IV fluids from D5LR to LR and recheck glucose in 1 hour. Reviewed insulin pump settings with patient and she went ahead and made the change to her basal rates as instructed on 11/18/20 by Jorene Guest, RD, LDN.  Current pump settings are: 12-6am  0.15 units/hour 6am-12am 0.25 units/hour Total 5.4 units/24 hours  CF 80 (1 unit drops glucose 80 mg/dl) I:C 6:23 (1 unit covers 20 grams of carbs) Target BG 100 mg/dl  Patient states she continues to be nauseous and not able to keep any fluid or food down. Informed patient that if glucose remains elevated or if she has any further issues with hypoglycemia, it would be recommended that the insulin pump be stopped and SQ insulin be used while inpatient. If insulin pump is removed, patient will need very low dose basal and very sensitive correction scale (Lantus 3 units Q24H, CBGs Q4H, Novolog 0-6 units Q4H).  Patient verbalized understanding of information discussed and states that she has no questions at this time. Encouraged RN to call diabetes coordinator (or page between 8am-5pm) if nurse or provider has any questions or concerns regarding glycemic control. Inpatient diabetes team will continue to follow along while inpatient.  Thanks, Orlando Penner, RN, MSN, CDE Diabetes Coordinator Inpatient Diabetes Program [phone] (Team Pager from 8am to 5pm)

## 2020-11-20 NOTE — Progress Notes (Signed)
  2030 CBG 150 per hospital glucometer 2030 CBG 109 per pt pump

## 2020-11-20 NOTE — H&P (Addendum)
ADMIT NOTE  HPI:      Ashley Camacho is a 30 y.o. G2P0101 who LMP was No LMP recorded (lmp unknown). Patient is pregnant.  Subjective: She presented last night not feeling well, complaining of N/V and not having anything to eat for 2-3 days.  She has "no food in the house, and doesn't feel like eating." Insulin pump still working - she says she has been in contact with her endocrinologist. Pt on multiple antiemetics but they are not working.          HISTORY No Known Allergies  OB History  OB History  Gravida Para Term Preterm AB Living  2 1   1   1   SAB IAB Ectopic Multiple Live Births          1    # Outcome Date GA Lbr Len/2nd Weight Sex Delivery Anes PTL Lv  2 Current           1 Preterm 12/13/10   1361 g F CS-Unspec  Y LIV    Past Medical History  Past Medical History:  Diagnosis Date   Anemia    Diabetes mellitus without complication (HCC)    Gastroparesis    Hypertension    Type 1 diabetes (HCC)     Past Surgical History  Past Surgical History:  Procedure Laterality Date   CESAREAN SECTION     MOUTH SURGERY        Past Social History:  Social History   Socioeconomic History   Marital status: Single    Spouse name: Not on file   Number of children: Not on file   Years of education: Not on file   Highest education level: Not on file  Occupational History   Occupation: home maker  Tobacco Use   Smoking status: Never   Smokeless tobacco: Never  Vaping Use   Vaping Use: Never used  Substance and Sexual Activity   Alcohol use: No   Drug use: No   Sexual activity: Yes    Birth control/protection: None  Other Topics Concern   Not on file  Social History Narrative   Not on file   Social Determinants of Health   Financial Resource Strain: Not on file  Food Insecurity: Not on file  Transportation Needs: Not on file  Physical Activity: Not on file  Stress: Not on file  Social Connections: Not on file    Family  History  Family History  Problem Relation Age of Onset   Diabetes type I Father    CAD Father    CAD Paternal Grandmother    CAD Paternal Grandfather    Breast cancer Mother    Ovarian cancer Neg Hx      ROS: Constitutional: Denied constitutional symptoms, night sweats, recent illness, fatigue, fever, insomnia and weight loss.  Eyes: Denied eye symptoms, eye pain, photophobia, vision change and visual disturbance.  Ears/Nose/Throat/Neck: Denied ear, nose, throat or neck symptoms, hearing loss, nasal discharge, sinus congestion and sore throat.  Cardiovascular: Denied cardiovascular symptoms, arrhythmia, chest pain/pressure, edema, exercise intolerance, orthopnea and palpitations.  Respiratory: Denied pulmonary symptoms, asthma, pleuritic pain, productive sputum, cough, dyspnea and wheezing.  Gastrointestinal: Denied, gastro-esophageal reflux, melena, nausea and vomiting.  Genitourinary: Denied genitourinary symptoms including symptomatic vaginal discharge, pelvic relaxation issues, and urinary complaints.  Musculoskeletal: Denied musculoskeletal symptoms, stiffness, swelling, muscle weakness and myalgia.  Dermatologic: Denied dermatology symptoms, rash and scar.  Neurologic: Denied neurology symptoms, dizziness, headache,  neck pain and syncope.  Psychiatric: Denied psychiatric symptoms, anxiety and depression.  Endocrine: Denied endocrine symptoms including hot flashes and night sweats.   Medications    Current Discharge Medication List     CONTINUE these medications which have NOT CHANGED   Details  aspirin EC 81 MG tablet Take 81 mg by mouth daily. Swallow whole.    Doxylamine-Pyridoxine (DICLEGIS PO) Take by mouth as needed (for nausea and vomiting).    famotidine (PEPCID) 20 MG tablet Take 20 mg by mouth 2 (two) times daily.    ondansetron (ZOFRAN ODT) 4 MG disintegrating tablet Take 1 tablet (4 mg total) by mouth every 8 (eight) hours as needed for nausea or  vomiting. Qty: 20 tablet, Refills: 1    Prenatal Vit-Fe Fumarate-FA (PRENATAL MULTIVITAMIN) TABS tablet Take 1 tablet by mouth daily at 12 noon.    prochlorperazine (COMPAZINE) 10 MG tablet Take 10 mg by mouth every 6 (six) hours as needed for nausea or vomiting.    ferrous sulfate 324 MG TBEC Take 324 mg by mouth.    insulin aspart (NOVOLOG) 100 UNIT/ML injection Inject 0-12 Units into the skin 3 (three) times daily before meals. Qty: 10 mL, Refills: 11   Comments: Novolog 1 unit for every 15 grams of CHO  -Novolog correction- 181-220 mg/dL- 1 unit, 962-229 mg/dL-2 units, 798-921 mg/dL- 3 units, 194-174 mg/dL-4 units, 081-448 mg/dL- 5 units, 185-631 mg/dL- 6 units         Objective: Vitals:   11/20/20 0413 11/20/20 0725  BP:  135/68  Pulse:  (!) 113  Resp:  16  Temp: 100.3 F (37.9 C) (!) 102.1 F (38.9 C)   Physical examination General NAD, Conversant  HEENT Atraumatic; Op clear with mmm.  Normo-cephalic. Pupils reactive. Anicteric sclerae  Thyroid/Neck Smooth without nodularity or enlargement. Normal ROM.  Neck Supple.  Skin No rashes, lesions or ulceration. Normal palpated skin turgor. No nodularity.  Breasts: Deferred  Lungs: Clear to auscultation.No rales or wheezes. Normal Respiratory effort, no retractions.  Slightly decreased breath sounds at the bases  Heart: NSR.  No murmurs or rubs appreciated. No periferal edema  Abdomen: Soft.  Non-tender.  No masses.  No HSM. No hernia positive fetal heart tones as noted  Extremities: Moves all appropriately.  Normal ROM for age. No lymphadenopathy.  Neuro: Oriented to PPT.  Normal mood. Normal affect.     Pelvic: No pelvic exam indicated at this time              ASSESSMENT:  1.  Febrile 2.  Hypoglycemia - pt with N/V and not eating 3.  Anemia of unknown cause  PLAN: 1.  IV hydration with D5 2.  IV antiemetics 3.  Hematology consult 4.  COVID testing   I spent greater than 120 minutes in the direct care of this  patient.  Brennan Bailey ,MD 11/20/2020,8:38 AM

## 2020-11-20 NOTE — Progress Notes (Signed)
RN assumed care of pt, blood pressure 135/68, HR 113, and temp 102.1. CBG 243,. FHT 174. Pt had 1 episode of emesis this morning.  RN attempted to contact A.Valentino Saxon, MD by phone will attempt another call.

## 2020-11-20 NOTE — Telephone Encounter (Signed)
Left voicemail for Ms. Pfund to call back regarding results of labs for CMV, Toxoplasmosis and cystic fibrosis. We may be reached at [phone]. Cherly Anderson, MS, CGC

## 2020-11-20 NOTE — Progress Notes (Signed)
Continous basal rate of .25units/hr. Pt recieved total of 1 units from 1600-2000. Verified by RN reviewing pt pump. 2030 CBG 34 per hospital glucometer 2030 CBG 55 per pt pump

## 2020-11-20 NOTE — TOC Initial Note (Addendum)
Transition of Care [facility]) - Initial/Assessment Note    Patient Details  Name: Ashley Camacho MRN: [id number] Date of Birth: 1991-03-31  [facility]) CM/SW Contact:    Hetty Ely, RN Phone Number: 11/20/2020, 4:01 PM  Clinical Narrative: Spoke with patient in the room, she was soft spoken, says she had Type 1 IDDM, not having an appetite for 3 days, when she try to eat she gets sick, vomitting. Blood glucose drops int he 40-50 range. Patient states she is followed by Endocrinologist who treats her with difference nausea medications and also OB provider at [facility]. Patient denies having problems getting food and says her boyfriend lives with her and although he works nights he is able to do shopping during the day. Patient denies having problems getting medications and going to medical appointments. She reports having a ten year old at home and says she had this problem during that pregnancy also, however this one is worse. I ask if the 30yo had problems eating at home, she replied no and that she is able to cook. Patient states she use to get food stamps, however when she moved in with boyfriend it was discontinued.  I later received a call from the nurse who voices patient shared that she had problems with transportation and buying food. Went back in the room while nurse was there to ask additional questions, patient did say that she was unable to get food due to transportation and money. Patient voices husband works at night and when shopping don't have the time for her to buy healthy foods. Patient says she has a Careers information officer, however did not know the name, only communicates electronically due to staffing. I did get patients permission to speak with case worker about resources.  DSS report was made after consulting with DSS supervisor.     4:15pm: DSS Intake returned call Lenon Ahmadi took report and will submit for screening.                     Patient  Goals and CMS Choice        Expected Discharge Plan and Services                                                Prior Living Arrangements/Services                       Activities of Daily Living Home Assistive Devices/Equipment: None ADL Screening (condition at time of admission) Patient's cognitive ability adequate to safely complete daily activities?: Yes Is the patient deaf or have difficulty hearing?: No Does the patient have difficulty seeing, even when wearing glasses/contacts?: No Does the patient have difficulty concentrating, remembering, or making decisions?: No Patient able to express need for assistance with ADLs?: Yes Does the patient have difficulty dressing or bathing?: No Independently performs ADLs?: Yes (appropriate for developmental age) Does the patient have difficulty walking or climbing stairs?: No Weakness of Legs: None Weakness of Arms/Hands: None  Permission Sought/Granted                  Emotional Assessment              Admission diagnosis:  Nausea and vomiting during pregnancy [O21.9] Indication for care in labor and delivery, antepartum [O75.9] Patient Active  Problem List   Diagnosis Date Noted   Indication for care in labor and delivery, antepartum 11/20/2020   Nausea and vomiting during pregnancy 10/23/2020   Adjustment disorder with mixed anxiety and depressed mood 05/14/2016   DKA (diabetic ketoacidoses) 04/03/2016   PCP:  Hildred Laser, MD Pharmacy:   [facility] [phone] [place], [place] - [address] [place] [address] Phone: [phone] Fax: [phone]     Social Determinants of Health (SDOH) Interventions    Readmission Risk Interventions No flowsheet data found.

## 2020-11-20 NOTE — OB Triage Note (Signed)
Dr. [name] notified by phone of patient's arrival and SBAR given. Dr. [name] gave telephone orders to admit patient for observation and insert IV to start fluids to hydrate and help improve hypoglycemic event. Consult to social services, and inpatient diabetes coordinator. Will notify patient on plan of care.

## 2020-11-20 NOTE — Progress Notes (Signed)
1200- Pt checked blood sugar on personal insulin pump and told RN it said, "it says 'high' which mean its probably over 400." RN checked CBG via hospital glucometer. CBG 340.  Diabetic coordinator discussed adjusting insulin pump settings per endocrinologist recommendations. Pt changed settings on pump. Pt received a bolus of 3.75 units of insulin through insulin pump to correct the high glucose.  D.Evans, MD notified of CBG and verbal order to stop Lactated ringer 5% dextrose and start lactated ringers at 138ml/hr and to recheck CBG in about an hr.   1337- 1 hr CBG recheck  291, Evans, MD notified. MD verbalized to have pt continue using insulin pump as long as CBGs are trending down to normal limits.   Rickard Patience, MD reviewed labs and [name], MD placed order to transfuse 1 unit of PRBC and obtain H&H 1 hour post transfusion.   1434 1 unit of PRBC started.

## 2020-11-20 NOTE — OB Triage Note (Signed)
Patient arrived to unit with complaints of nausea and vomiting. Patient is G2P1, 107w3d. Patient reports that for the past 2 days she has not had any substantial food to eat. Patient states the orange Juice she drank also keeps coming up. She reports taking diclegis, zofran, and compazine by mouth at home with no improvement. Patient reports she has been consulting with endocrinology who even advised to ingest raw sugar and she was unable to because the clumps were hard in the sugar from being refrigerated to keep ants out. Patient FHT checked (see flowsheet). Patient's skin is clammy to touch. CBG is 37 ml/dl. Carbohydrate snack given. Will notify provider of patient's arrival.

## 2020-11-21 ENCOUNTER — Encounter
Admission: EM | Disposition: A | Discharge status: Other Acute Inpatient Hospital | Payer: Self-pay | Source: Home / Self Care | Attending: Obstetrics and Gynecology

## 2020-11-21 ENCOUNTER — Inpatient Hospital Stay: Payer: Medicaid Other

## 2020-11-21 ENCOUNTER — Encounter: Payer: Self-pay | Admitting: Obstetrics and Gynecology

## 2020-11-21 DIAGNOSIS — K3184 Gastroparesis: Secondary | ICD-10-CM | POA: Diagnosis present

## 2020-11-21 DIAGNOSIS — O26832 Pregnancy related renal disease, second trimester: Secondary | ICD-10-CM | POA: Diagnosis not present

## 2020-11-21 DIAGNOSIS — D509 Iron deficiency anemia, unspecified: Secondary | ICD-10-CM | POA: Diagnosis present

## 2020-11-21 DIAGNOSIS — R0602 Shortness of breath: Secondary | ICD-10-CM | POA: Diagnosis not present

## 2020-11-21 DIAGNOSIS — Z659 Problem related to unspecified psychosocial circumstances: Secondary | ICD-10-CM | POA: Diagnosis not present

## 2020-11-21 DIAGNOSIS — O99012 Anemia complicating pregnancy, second trimester: Secondary | ICD-10-CM

## 2020-11-21 DIAGNOSIS — D696 Thrombocytopenia, unspecified: Secondary | ICD-10-CM | POA: Diagnosis present

## 2020-11-21 DIAGNOSIS — E538 Deficiency of other specified B group vitamins: Secondary | ICD-10-CM

## 2020-11-21 DIAGNOSIS — O212 Late vomiting of pregnancy: Secondary | ICD-10-CM | POA: Diagnosis present

## 2020-11-21 DIAGNOSIS — E60 Dietary zinc deficiency: Secondary | ICD-10-CM | POA: Diagnosis not present

## 2020-11-21 DIAGNOSIS — N179 Acute kidney failure, unspecified: Secondary | ICD-10-CM | POA: Diagnosis not present

## 2020-11-21 DIAGNOSIS — Z609 Problem related to social environment, unspecified: Secondary | ICD-10-CM

## 2020-11-21 DIAGNOSIS — Z9641 Presence of insulin pump (external) (internal): Secondary | ICD-10-CM | POA: Diagnosis present

## 2020-11-21 DIAGNOSIS — Z3A21 21 weeks gestation of pregnancy: Secondary | ICD-10-CM

## 2020-11-21 DIAGNOSIS — O24012 Pre-existing diabetes mellitus, type 1, in pregnancy, second trimester: Principal | ICD-10-CM

## 2020-11-21 DIAGNOSIS — Z794 Long term (current) use of insulin: Secondary | ICD-10-CM | POA: Diagnosis not present

## 2020-11-21 DIAGNOSIS — J189 Pneumonia, unspecified organism: Secondary | ICD-10-CM | POA: Diagnosis not present

## 2020-11-21 DIAGNOSIS — Z20822 Contact with and (suspected) exposure to covid-19: Secondary | ICD-10-CM | POA: Diagnosis present

## 2020-11-21 DIAGNOSIS — E1043 Type 1 diabetes mellitus with diabetic autonomic (poly)neuropathy: Secondary | ICD-10-CM | POA: Diagnosis present

## 2020-11-21 DIAGNOSIS — R600 Localized edema: Secondary | ICD-10-CM | POA: Diagnosis not present

## 2020-11-21 DIAGNOSIS — O219 Vomiting of pregnancy, unspecified: Secondary | ICD-10-CM | POA: Diagnosis not present

## 2020-11-21 DIAGNOSIS — R1110 Vomiting, unspecified: Secondary | ICD-10-CM | POA: Diagnosis not present

## 2020-11-21 DIAGNOSIS — R079 Chest pain, unspecified: Secondary | ICD-10-CM | POA: Diagnosis not present

## 2020-11-21 DIAGNOSIS — D508 Other iron deficiency anemias: Secondary | ICD-10-CM | POA: Diagnosis not present

## 2020-11-21 DIAGNOSIS — O99512 Diseases of the respiratory system complicating pregnancy, second trimester: Secondary | ICD-10-CM | POA: Diagnosis present

## 2020-11-21 DIAGNOSIS — O99112 Other diseases of the blood and blood-forming organs and certain disorders involving the immune mechanism complicating pregnancy, second trimester: Secondary | ICD-10-CM | POA: Diagnosis present

## 2020-11-21 DIAGNOSIS — J90 Pleural effusion, not elsewhere classified: Secondary | ICD-10-CM | POA: Diagnosis not present

## 2020-11-21 DIAGNOSIS — E10649 Type 1 diabetes mellitus with hypoglycemia without coma: Secondary | ICD-10-CM | POA: Diagnosis present

## 2020-11-21 DIAGNOSIS — J811 Chronic pulmonary edema: Secondary | ICD-10-CM | POA: Diagnosis not present

## 2020-11-21 DIAGNOSIS — R7989 Other specified abnormal findings of blood chemistry: Secondary | ICD-10-CM | POA: Diagnosis not present

## 2020-11-21 DIAGNOSIS — D649 Anemia, unspecified: Secondary | ICD-10-CM | POA: Diagnosis not present

## 2020-11-21 DIAGNOSIS — A419 Sepsis, unspecified organism: Secondary | ICD-10-CM | POA: Diagnosis present

## 2020-11-21 DIAGNOSIS — R34 Anuria and oliguria: Secondary | ICD-10-CM | POA: Diagnosis not present

## 2020-11-21 LAB — CBC
HCT: 21.7 % — ABNORMAL LOW (ref 36.0–46.0)
HCT: 21.9 % — ABNORMAL LOW (ref 36.0–46.0)
Hemoglobin: 7.5 g/dL — ABNORMAL LOW (ref 12.0–15.0)
Hemoglobin: 7.5 g/dL — ABNORMAL LOW (ref 12.0–15.0)
MCH: 30.9 pg (ref 26.0–34.0)
MCH: 31 pg (ref 26.0–34.0)
MCHC: 34.6 g/dL (ref 30.0–36.0)
MCHC: 34.2 g/dL (ref 30.0–36.0)
MCV: 89.3 fL (ref 80.0–100.0)
MCV: 90.5 fL (ref 80.0–100.0)
Platelets: 129 10*3/uL — ABNORMAL LOW (ref 150–400)
Platelets: 134 10*3/uL — ABNORMAL LOW (ref 150–400)
RBC: 2.43 MIL/uL — ABNORMAL LOW (ref 3.87–5.11)
RBC: 2.42 MIL/uL — ABNORMAL LOW (ref 3.87–5.11)
RDW: 13.9 % (ref 11.5–15.5)
RDW: 13.8 % (ref 11.5–15.5)
WBC: 2.7 10*3/uL — ABNORMAL LOW (ref 4.0–10.5)
WBC: 4.9 10*3/uL (ref 4.0–10.5)
nRBC: 0 % (ref 0.0–0.2)
nRBC: 0 % (ref 0.0–0.2)

## 2020-11-21 LAB — URINALYSIS, ROUTINE W REFLEX MICROSCOPIC
Bilirubin Urine: NEGATIVE
Glucose, UA: NEGATIVE mg/dL
Ketones, ur: 20 mg/dL — AB
Nitrite: NEGATIVE
Protein, ur: 100 mg/dL — AB
Specific Gravity, Urine: 1.017 (ref 1.005–1.030)
pH: 5 (ref 5.0–8.0)

## 2020-11-21 LAB — GLUCOSE, CAPILLARY
Glucose-Capillary: 110 mg/dL — ABNORMAL HIGH (ref 70–99)
Glucose-Capillary: 114 mg/dL — ABNORMAL HIGH (ref 70–99)
Glucose-Capillary: 124 mg/dL — ABNORMAL HIGH (ref 70–99)
Glucose-Capillary: 132 mg/dL — ABNORMAL HIGH (ref 70–99)
Glucose-Capillary: 141 mg/dL — ABNORMAL HIGH (ref 70–99)
Glucose-Capillary: 72 mg/dL (ref 70–99)
Glucose-Capillary: 73 mg/dL (ref 70–99)
Glucose-Capillary: 78 mg/dL (ref 70–99)
Glucose-Capillary: 79 mg/dL (ref 70–99)
Glucose-Capillary: 99 mg/dL (ref 70–99)

## 2020-11-21 LAB — URINE DRUG SCREEN, QUALITATIVE (ARMC ONLY)
Amphetamines, Ur Screen: NOT DETECTED
Barbiturates, Ur Screen: NOT DETECTED
Benzodiazepine, Ur Scrn: NOT DETECTED
Cannabinoid 50 Ng, Ur ~~LOC~~: NOT DETECTED
Cocaine Metabolite,Ur ~~LOC~~: NOT DETECTED
MDMA (Ecstasy)Ur Screen: NOT DETECTED
Methadone Scn, Ur: NOT DETECTED
Opiate, Ur Screen: NOT DETECTED
Phencyclidine (PCP) Ur S: NOT DETECTED
Tricyclic, Ur Screen: POSITIVE — AB

## 2020-11-21 LAB — TROPONIN I (HIGH SENSITIVITY)
Troponin I (High Sensitivity): 9 ng/L (ref <18)
Troponin I (High Sensitivity): 8 ng/L (ref <18)

## 2020-11-21 LAB — COMPREHENSIVE METABOLIC PANEL
ALT: 16 U/L (ref 0–44)
AST: 35 U/L (ref 15–41)
Albumin: 2.5 g/dL — ABNORMAL LOW (ref 3.5–5.0)
Alkaline Phosphatase: 39 U/L (ref 38–126)
Anion gap: 6 (ref 5–15)
BUN: 9 mg/dL (ref 6–20)
CO2: 22 mmol/L (ref 22–32)
Calcium: 8.1 mg/dL — ABNORMAL LOW (ref 8.9–10.3)
Chloride: 108 mmol/L (ref 98–111)
Creatinine, Ser: 1.01 mg/dL — ABNORMAL HIGH (ref 0.44–1.00)
GFR, Estimated: >60 mL/min (ref >60)
Glucose, Bld: 121 mg/dL — ABNORMAL HIGH (ref 70–99)
Potassium: 4.1 mmol/L (ref 3.5–5.1)
Sodium: 136 mmol/L (ref 135–145)
Total Bilirubin: 0.6 mg/dL (ref 0.3–1.2)
Total Protein: 5.6 g/dL — ABNORMAL LOW (ref 6.5–8.1)

## 2020-11-21 LAB — VITAMIN B12: Vitamin B-12: 288 pg/mL (ref 180–914)

## 2020-11-21 LAB — FIBRINOGEN: Fibrinogen: 469 mg/dL (ref 210–475)

## 2020-11-21 LAB — LACTIC ACID, PLASMA: Lactic Acid, Venous: 0.7 mmol/L (ref 0.5–1.9)

## 2020-11-21 LAB — TSH: TSH: 3.26 u[IU]/mL (ref 0.350–4.500)

## 2020-11-21 LAB — D-DIMER, QUANTITATIVE: D-Dimer, Quant: 3.99 ug/mL-FEU — ABNORMAL HIGH (ref 0.00–0.50)

## 2020-11-21 LAB — RESP PANEL BY RT-PCR (FLU A&B, COVID) ARPGX2
Influenza A by PCR: NEGATIVE
Influenza B by PCR: NEGATIVE
SARS Coronavirus 2 by RT PCR: NEGATIVE

## 2020-11-21 LAB — PROTEIN / CREATININE RATIO, URINE
Creatinine, Urine: 127 mg/dL
Protein Creatinine Ratio: 1.03 mg/mg{Cre} — ABNORMAL HIGH (ref 0.00–0.15)
Total Protein, Urine: 131 mg/dL

## 2020-11-21 LAB — FOLATE: Folate: 36 ng/mL (ref >5.9)

## 2020-11-21 LAB — PROCALCITONIN: Procalcitonin: 0.26 ng/mL

## 2020-11-21 SURGERY — Surgical Case

## 2020-11-21 MED ORDER — PANTOPRAZOLE SODIUM 40 MG PO TBEC
40.0000 mg | DELAYED_RELEASE_TABLET | Freq: Every day | ORAL | Status: DC
  Administered 2020-11-21 – 2020-11-23 (×2): 40 mg via ORAL
  Filled 2020-11-21 (×3): qty 1

## 2020-11-21 MED ORDER — THIAMINE HCL 100 MG/ML IJ SOLN
Freq: Once | INTRAVENOUS | Status: AC
  Filled 2020-11-21: qty 1000

## 2020-11-21 MED ORDER — FAMOTIDINE IN NACL 20-0.9 MG/50ML-% IV SOLN
20.0000 mg | Freq: Two times a day (BID) | INTRAVENOUS | Status: DC
  Administered 2020-11-21 – 2020-11-23 (×4): 20 mg via INTRAVENOUS
  Filled 2020-11-21 (×4): qty 50

## 2020-11-21 MED ORDER — CYANOCOBALAMIN 1000 MCG/ML IJ SOLN
1000.0000 ug | Freq: Every day | INTRAMUSCULAR | Status: DC
  Administered 2020-11-22: 1000 ug via INTRAMUSCULAR
  Filled 2020-11-21 (×2): qty 1

## 2020-11-21 MED ORDER — SCOPOLAMINE 1 MG/3DAYS TD PT72
1.0000 | MEDICATED_PATCH | TRANSDERMAL | Status: DC
  Administered 2020-11-21 – 2020-11-24 (×2): 1.5 mg via TRANSDERMAL
  Filled 2020-11-21 (×2): qty 1

## 2020-11-21 MED ORDER — ONDANSETRON 4 MG PO TBDP
8.0000 mg | ORAL_TABLET | Freq: Three times a day (TID) | ORAL | Status: DC
  Administered 2020-11-21: 8 mg via ORAL
  Filled 2020-11-21 (×3): qty 2

## 2020-11-21 MED ORDER — IOHEXOL 350 MG/ML SOLN
75.0000 mL | Freq: Once | INTRAVENOUS | Status: AC | PRN
  Administered 2020-11-21: 75 mL via INTRAVENOUS

## 2020-11-21 MED ORDER — ENOXAPARIN SODIUM 30 MG/0.3ML IJ SOSY
24.0000 mg | PREFILLED_SYRINGE | Freq: Two times a day (BID) | INTRAMUSCULAR | Status: DC
  Administered 2020-11-21 – 2020-11-24 (×6): 25 mg via SUBCUTANEOUS
  Filled 2020-11-21 (×6): qty 0.25

## 2020-11-21 MED ORDER — CYANOCOBALAMIN 1000 MCG/ML IJ SOLN: 1000.0000 ug | Freq: Every day | INTRAMUSCULAR | Status: DC

## 2020-11-21 SURGICAL SUPPLY — 27 items
BACTOSHIELD CHG 4% 4OZ (MISCELLANEOUS) ×1
BAG COUNTER SPONGE EZ (MISCELLANEOUS) ×2 IMPLANT
CHLORAPREP W/TINT 26 (MISCELLANEOUS) ×4 IMPLANT
DRSG TELFA 3X8 NADH (GAUZE/BANDAGES/DRESSINGS) ×2 IMPLANT
ELECT REM PT RETURN 9FT ADLT (ELECTROSURGICAL) ×2
ELECTRODE REM PT RTRN 9FT ADLT (ELECTROSURGICAL) ×1 IMPLANT
EXTRT SYSTEM ALEXIS 17CM (MISCELLANEOUS)
GAUZE SPONGE 4X4 12PLY STRL (GAUZE/BANDAGES/DRESSINGS) ×2 IMPLANT
GLOVE SURG ENC MOIS LTX SZ6.5 (GLOVE) ×2 IMPLANT
GLOVE SURG UNDER LTX SZ7 (GLOVE) ×2 IMPLANT
GOWN STRL REUS W/ TWL LRG LVL3 (GOWN DISPOSABLE) ×2 IMPLANT
GOWN STRL REUS W/TWL LRG LVL3 (GOWN DISPOSABLE) ×2
KIT TURNOVER KIT A (KITS) ×2 IMPLANT
MANIFOLD NEPTUNE II (INSTRUMENTS) ×2 IMPLANT
MAT PREVALON FULL STRYKER (MISCELLANEOUS) ×2 IMPLANT
NS IRRIG 1000ML POUR BTL (IV SOLUTION) ×2 IMPLANT
PACK C SECTION AR (MISCELLANEOUS) ×2 IMPLANT
PAD OB MATERNITY 4.3X12.25 (PERSONAL CARE ITEMS) ×2 IMPLANT
PAD PREP 24X41 OB/GYN DISP (PERSONAL CARE ITEMS) ×2 IMPLANT
PENCIL SMOKE EVACUATOR (MISCELLANEOUS) ×2 IMPLANT
SCRUB CHG 4% DYNA-HEX 4OZ (MISCELLANEOUS) ×1 IMPLANT
SUT MNCRL AB 4-0 PS2 18 (SUTURE) ×2 IMPLANT
SUT PLAIN 2 0 XLH (SUTURE) IMPLANT
SUT VIC AB 0 CT1 36 (SUTURE) ×8 IMPLANT
SUT VIC AB 3-0 SH 27 (SUTURE) ×1
SUT VIC AB 3-0 SH 27X BRD (SUTURE) ×1 IMPLANT
SYSTEM CONTND EXTRCTN KII BLLN (MISCELLANEOUS) IMPLANT

## 2020-11-21 NOTE — Progress Notes (Signed)
167-pt meter 124-hospital meter Pt reports continuous rate of .25U/hr

## 2020-11-21 NOTE — Progress Notes (Signed)
Antenatal Progress Note  Subjective:     Patient ID: Ashley Camacho is a 30 y.o. female  at [redacted]w[redacted]d , Estimated Date of Delivery: 03/30/21 by 9 week ultrasound, LMP unknown.She is a Type 1 diabetic utilizing an insulin pump and severe anemia of pregnancy who was admitted for nausea and vomiting, hypoglycemia. HD# 1.   Hospital course to date:  S/p Hematology consult, undergoing workup for severe anemia.  Received 1 unit PRBCs this admission, Hgb improved from 6.1 to 7.3. Stable this morning at 7.5. Recommend iron infusion today.  2. Monitoring blood sugars, have improved overnight, continue D5LR with insulin pump use. 3. S/p social work consult due to social issues 4. Nausea/vomiting persistent despite Phenergan IV infusion. Initiated on Reglan overnight in addition to Phenergan.   Subjective:  Patient reports that she has been having nausea and vomiting still.  Mostly retching now.   Review of Systems Denies contractions, leakage of fluids, vaginal bleeding, and reports good fetal movement.     Objective:   Vitals:   11/20/20 1600 11/21/20 0632 11/21/20 0723 11/21/20 0724  BP: 113/68   138/76  Pulse: 97   (!) 103  Resp: 16  16   Temp: 97.9 F (36.6 C) 99.1 F (37.3 C) 98.8 F (37.1 C)   TempSrc: Oral Oral Oral   SpO2:    95%  Weight:      Height:        General appearance: alert and no distress Lungs: clear to auscultation bilaterally Heart: regular rate and rhythm, S1, S2 normal, no murmur, click, rub or gallop Abdomen: soft, non-tender; bowel sounds normal; no masses,  no organomegaly Pelvic: deferred Extremities: extremities normal, atraumatic, no cyanosis or edema   Fetus A Non-Stress Test Interpretation for 11/21/20  Indication:  Type 1 DM  Fetal Heart Rate A Mode: Doppler Baseline Rate (A): 150 bpm (fht)  Uterine Activity Mode: Palpation Resting Tone Palpated: Relaxed     Labs:  CBC Latest Ref Rng & Units 11/21/2020 11/20/2020 11/20/2020  WBC 4.0 -  10.5 K/uL 2.7(L) - 3.0(L)  Hemoglobin 12.0 - 15.0 g/dL 7.5(L) 7.3(L) 6.1(L)  Hematocrit 36.0 - 46.0 % 21.7(L) 21.1(L) 17.5(L)  Platelets 150 - 400 K/uL 129(L) - 149(L)    CMP Latest Ref Rng & Units 11/21/2020 11/20/2020 11/20/2020  Glucose 70 - 99 mg/dL 810(F) 751(W) 258(N)  BUN 6 - 20 mg/dL 9 10 -  Creatinine 2.77 - 1.00 mg/dL 8.24(M) 3.53(I) -  Sodium 135 - 145 mmol/L 136 134(L) -  Potassium 3.5 - 5.1 mmol/L 4.1 4.4 -  Chloride 98 - 111 mmol/L 108 106 -  CO2 22 - 32 mmol/L 22 19(L) -  Calcium 8.9 - 10.3 mg/dL 8.1(L) 7.9(L) -  Total Protein 6.5 - 8.1 g/dL 1.4(E) 5.6(L) -  Total Bilirubin 0.3 - 1.2 mg/dL 0.6 0.9 -  Alkaline Phos 38 - 126 U/L 39 42 -  AST 15 - 41 U/L 35 27 -  ALT 0 - 44 U/L 16 12 -      Results for Ashley Camacho, Ashley Camacho (MRN [id number]) as of 11/21/2020 08:52  Ref. Range 11/20/2020 21:01 11/20/2020 23:02 11/21/2020 01:09 11/21/2020 03:10 11/21/2020 04:56  Glucose-Capillary Latest Ref Range: 70 - 99 mg/dL 619 (H) 90 72 78 79    Lab Results  Component Value Date   TSH 3.260 11/21/2020     Assessment:  30 y.o. female [redacted]w[redacted]d, with:     Nausea and vomiting in pregnancy Type 1 DM Anemia affecting second trimester  Social issues in pregnancy   Plan:   Nausea and vomiting in pregnancy - On Phenergan infusion. Also placed on Reglan scheduled starting last night. Will place scopolamine patch for retching and try ODT Zofran 8 mg.  Patient report in past pregnancy, mostly failed antiemetics and nausea/vomiting resolved with time. Once resolved, can begin increasing dietary intake.  Patient also with h/o febrile morbidity yesterday, with COVID testing negative. Repeat COVD testing this morning again negative. Also will give banana bag Type 1 DM - patient currently NPO due to nausea/vomiting.  Currently has insulin pump running, rate is 0.25 units/hr.  Titrating IV fluids to maintin a normal glucose levels.  Currently on D5LR . Continue q 2 hr checks.  Anemia affecting second trimester,  s/p transfusion 1 unit PRBCs, with improvement. Has remained stable this morning. Hematology following. Recommend iron infusion this morning, ordered.  Social issues in pregnancy - S/p consult with social work, will defer to their recommendations.   Transitioned care back to Dr. [name] this morning.    [facility] 11/21/2020 9:02 AM

## 2020-11-21 NOTE — Progress Notes (Signed)
Increase D5NS.45, BS 72 (pt's pump BS reading 91), maintain BS 100-120 range per provider

## 2020-11-21 NOTE — Progress Notes (Signed)
Inpatient Diabetes Program Recommendations  Diabetes Treatment Program Recommendations  ADA Standards of Care 2018 Diabetes in Pregnancy Target Glucose Ranges:  Fasting: 60 - 90 mg/dL Preprandial: 60 - 675 mg/dL 1 hr postprandial: Less than 140mg /dL (from first bite of meal) 2 hr postprandial: Less than 120 mg/dL (from first bite of meal)     Review of Glycemic Control Results for [name], [name] (MRN [name]) as of 11/21/2020 08:55  Ref. Range 11/20/2020 13:37 11/20/2020 19:35 11/20/2020 20:33 11/20/2020 21:01 11/20/2020 23:02 11/21/2020 01:09 11/21/2020 03:10 11/21/2020 04:56  Glucose-Capillary Latest Ref Range: 70 - 99 mg/dL 01/22/2021 (H) 27 (LL) 34 (LL) 150 (H) 90 72 78 79   Diabetes history: DM1 (makes NO insulin; requires basal, correction, and carb coverage insulin) Outpatient Diabetes medications: OmniPod insulin pump with Novolog insulin Current orders for Inpatient glycemic control: Insulin Pump Q4H  NOTE: In reviewing the chart, noted patient has Type 1 DM (makes NO insulin) and uses an insulin pump for glycemic control. 12-6am 0.2 units/hour       (reduced to 0.15 units/hour) 6am-12am 0.3 units/hour (reduced to 0.25 units/hour) Total 6.6 units/24 hours of basal insulin (5.4 units/24 hours with reductions) CF 80 (1 unit drops glucose 80 mg/dl) I:C 993 (1 unit covers 20 grams of carbs) Target BG 100"   Watching glucose trends today. Note Hypoglycemia yesterday evening.   -   if she has further hypoglycemia, would recommend discontinuing the insulin pump and ordering Lantus 3 units Q24H, CBGs Q4H, Novolog 0-6 units Q4H, and if patient is ordered diet and eating at least 50% of meals would recommend Novolog 2 units TID with meals.   Thanks, 5:70 RN, MSN, BC-ADM Inpatient Diabetes Coordinator Team Pager [phone] (8a-5p)

## 2020-11-21 NOTE — Consult Note (Signed)
[facility] Consult Note  Ashley Camacho ZOX:[id number] DOB: [birth year] DOA: 11/19/2020  PCP: Hildred Laser, MD  Outpatient Specialists: Dr. Amalia Hailey, endocrinology  Patient coming from: home  I have personally briefly reviewed patient's old medical records in [facility] EMR.  Chief Concern: Nausea and vomiting  HPI: Ashley Camacho is a 30 y.o. female with medical history significant for insulin-dependent diabetes mellitus on insulin pump, G2 P1, 20 weeks 3 days, gastroparesis,admitted to labor and delivery observation unit for chief concerns of persistent nausea and vomiting.  [facility] has been consulted for fever and leukocytosis.  At bedside, she is awake and alert and able to complete the full H&P and exam.  She reports that she has persistent shortness of breath and chest pain and this has been consistent with her first and current pregnancy.  She states that this has been going on for weeks.  She reports that the chest pain is sharp, and persistent.  She reports that the shortness of breath is worse with exertion.  She denies fever at home.  She endorses nausea and vomiting at home.  She denies abdominal pain, trauma, dysuria, hematuria, new cough.  She endorsed that about 1 week ago she developed a rash in her right lower arm, mid anterior where an IV was placed at a outside institution.  She states that the rash was red and has resolved in the last couple of days.  She also endorses persistent nonproductive cough with tickling in the back of her throat that is not unusual for her.  Social history: She denies tobacco, EtOH, recreational drug use.  ROS: Constitutional: no weight change, + fever ENT/Mouth: no sore throat, no rhinorrhea Eyes: no eye pain, no vision changes Cardiovascular: + chest pain, + dyspnea,  no edema, no palpitations Respiratory: no cough, no sputum, no wheezing Gastrointestinal: no nausea, no vomiting, no diarrhea, no  constipation Genitourinary: no urinary incontinence, no dysuria, no hematuria Musculoskeletal: no arthralgias, no myalgias Skin: no skin lesions, no pruritus, Neuro: + weakness, no loss of consciousness, no syncope Psych: no anxiety, no depression, + decrease appetite Heme/Lymph: no bruising, no bleeding  Hospital course: 30 year old female G2, P1 presented to the hospital on 11/20/2020 for chief concerns of generalized malaise and nausea and vomiting.  She was found to have fever with hypoglycemia while having an insulin pump and severe acute anemia.  She was initiated on IV hydration with D5, IV antiemetics, hematology was consulted.  Assessment/Plan  Principal Problem:   Nausea and vomiting during pregnancy Active Problems:   Indication for care in labor and delivery, antepartum   Anemia affecting pregnancy in second trimester   Absolute anemia   AKI (acute kidney injury) (HCC)   Social problem   Nausea and vomiting during pregnancy prior to [redacted] weeks gestation   Low serum vitamin B12   # Meets sepsis criteria with elevated heart rate, leukocytosis, fever # Rash of the right upper extremity in setting of IV placement - Etiology source work-up in progress at this time - Agree with lactic acid x2, blood cultures x2, UA and urine culture, troponins x2 - Recommend to check D-dimer, procalcitonin, fibrinogen - COVID PCR was negative on 11/20/2020 and 11/21/2020 - Recommend to maintain MAP greater than 65 - We will check fibrinogen - Check EKG, bilateral lower extremity ultrasound to assess for DVT - [facility] will continue to follow  # Severe acute on chronic anemia in setting of iron deficiency and dilutional anemia in setting of pregnancy -  Status post 1 unit packed red blood cell - Appreciate hematology recommendation  # Thrombocytopenia-check fibrinogen  Chart reviewed.   DVT prophylaxis: Per primary attending Code Status: Full code Diet: Per primary Disposition  Plan: Per primary attending  Past Medical History:  Diagnosis Date   Anemia    Diabetes mellitus without complication (HCC)    Gastroparesis    Hypertension    Type 1 diabetes (HCC)    Past Surgical History:  Procedure Laterality Date   CESAREAN SECTION     MOUTH SURGERY     Social History:  reports that she has never smoked. She has never used smokeless tobacco. She reports that she does not drink alcohol and does not use drugs.  No Known Allergies Family History  Problem Relation Age of Onset   Diabetes type I Father    CAD Father    CAD Paternal Grandmother    CAD Paternal Grandfather    Breast cancer Mother    Ovarian cancer Neg Hx    Family history: Family history reviewed and not pertinent  Prior to Admission medications   Medication Sig Start Date End Date Taking? Authorizing Provider  aspirin EC 81 MG tablet Take 81 mg by mouth daily. Swallow whole.   Yes [provider]  Doxylamine-Pyridoxine (DICLEGIS PO) Take by mouth as needed (for nausea and vomiting).   Yes [provider]  famotidine (PEPCID) 20 MG tablet Take 20 mg by mouth 2 (two) times daily.   Yes [provider]  ondansetron (ZOFRAN ODT) 4 MG disintegrating tablet Take 1 tablet (4 mg total) by mouth every 8 (eight) hours as needed for nausea or vomiting. 10/23/20  Yes Hildred Laser, MD  Prenatal Vit-Fe Fumarate-FA (PRENATAL MULTIVITAMIN) TABS tablet Take 1 tablet by mouth daily at 12 noon.   Yes [provider]  prochlorperazine (COMPAZINE) 10 MG tablet Take 10 mg by mouth every 6 (six) hours as needed for nausea or vomiting.   Yes [provider]  ferrous sulfate 324 MG TBEC Take 324 mg by mouth. Patient not taking: Reported on 11/20/2020    [provider]  insulin aspart (NOVOLOG) 100 UNIT/ML injection Inject 0-12 Units into the skin 3 (three) times daily before meals. 11/29/17   Houston Siren, MD   Physical Exam: Vitals:   11/21/20 1841 11/21/20  1851 11/21/20 1852 11/21/20 1853  BP:      Pulse: (!) 118 (!) 117 (!) 117 (!) 117  Resp: (!) 28 (!) 27 (!) 25 (!) 21  Temp:    (!) 102.7 F (39.3 C)  TempSrc:    Oral  SpO2: 94% 95% 94% 95%  Weight:      Height:       Constitutional: appears age-appropriate, NAD, calm, comfortable Eyes: PERRL, lids and conjunctivae normal ENMT: Mucous membranes are moist. Posterior pharynx clear of any exudate or lesions. Age-appropriate dentition. Hearing appropriate Neck: normal, supple, no masses, no thyromegaly Respiratory: clear to auscultation bilaterally, no wheezing, no crackles. Normal respiratory effort. No accessory muscle use.  Cardiovascular: Regular rate and rhythm, no murmurs / rubs / gallops.  Bilateral lower extremity ankle swelling. 2+ pedal pulses. No carotid bruits.  Abdomen: Pregnant abdomen, no tenderness, no masses palpated, no hepatosplenomegaly. Bowel sounds positive.  Musculoskeletal: no clubbing / cyanosis. No joint deformity upper and lower extremities. Good ROM, no contractures, no atrophy. Normal muscle tone.  Skin: no rashes, lesions, ulcers. No induration Neurologic: Sensation intact. Strength 5/5 in all 4.  Psychiatric: Normal judgment  and insight. Alert and oriented x 3. Normal mood.   EKG: Ordered  Chest x-ray on Admission: Not ordered given patient is pregnant  Labs on Admission: I have personally reviewed following labs  CBC: Recent Labs  Lab 11/20/20 0154 11/20/20 1715 11/21/20 0649  WBC 3.0*  --  2.7*  NEUTROABS 2.5  --   --   HGB 6.1* 7.3* 7.5*  HCT 17.5* 21.1* 21.7*  MCV 88.8  --  89.3  PLT 149*  --  129*   Basic Metabolic Panel: Recent Labs  Lab 11/20/20 0154 11/20/20 1148 11/21/20 0649  NA  --  134* 136  K  --  4.4 4.1  CL  --  106 108  CO2  --  19* 22  GLUCOSE 263* 339* 121*  BUN  --  10 9  CREATININE  --  1.14* 1.01*  CALCIUM  --  7.9* 8.1*   GFR: Estimated Creatinine Clearance: 62 mL/min (A) (by C-G formula based on SCr of 1.01  mg/dL (H)).  Liver Function Tests: Recent Labs  Lab 11/20/20 1148 11/21/20 0649  AST 27 35  ALT 12 16  ALKPHOS 42 39  BILITOT 0.9 0.6  PROT 5.6* 5.6*  ALBUMIN 2.5* 2.5*   CBG: Recent Labs  Lab 11/21/20 0902 11/21/20 1120 11/21/20 1303 11/21/20 1518 11/21/20 1813  GLUCAP 114* 110* 124* 132* 141*   Thyroid Function Tests: Recent Labs    11/21/20 0649  TSH 3.260   Anemia Panel: Recent Labs    11/20/20 1148 11/21/20 0649  VITAMINB12  --  288  FOLATE  --  36.0  FERRITIN 626*  --   TIBC 228*  --   IRON 23*  --   RETICCTPCT 2.7  --    Urine analysis:    Component Value Date/Time   COLORURINE AMBER (A) 11/21/2020 1811   APPEARANCEUR HAZY (A) 11/21/2020 1811   APPEARANCEUR Clear 09/18/2020 1153   LABSPEC 1.017 11/21/2020 1811   LABSPEC 1.026 03/01/2013 1844   PHURINE 5.0 11/21/2020 1811   GLUCOSEU NEGATIVE 11/21/2020 1811   GLUCOSEU >=500 03/01/2013 1844   HGBUR LARGE (A) 11/21/2020 1811   BILIRUBINUR NEGATIVE 11/21/2020 1811   BILIRUBINUR neg 10/23/2020 0953   BILIRUBINUR Negative 09/18/2020 1153   BILIRUBINUR Negative 03/01/2013 1844   KETONESUR 20 (A) 11/21/2020 1811   PROTEINUR 100 (A) 11/21/2020 1811   UROBILINOGEN 0.2 10/23/2020 0953   NITRITE NEGATIVE 11/21/2020 1811   LEUKOCYTESUR TRACE (A) 11/21/2020 1811   LEUKOCYTESUR Negative 03/01/2013 1844   Thank you for involving me in the care of Ms. Cleon Gustin.  Dr. Sedalia Muta [facility]  If 7PM-7AM, please contact overnight-coverage provider If 7AM-7PM, please contact day coverage provider [email or web address]  11/21/2020, 6:59 PM

## 2020-11-21 NOTE — Progress Notes (Addendum)
Patient ID: Ashley Camacho, female   DOB: [birth year], 30 y.o.   MRN: [id number]    Subjective:    She has been experiencing nausea and vomiting throughout the day and this morning it was significantly worse than it has been.  Multiple antiemetics have been used without success.  Patient notes that she feels a scratchy throat and pressure in her upper chest.  This evening she began to experience worsening chest discomfort and patient began having shortness of breath.  At that time she was noted to have a decreased oxygen saturation. She was also found to be febrile.  Objective:    Patient Vitals for the past 2 hrs:  Pulse Resp SpO2  11/21/20 2130 (!) 105 (!) 23 97 %   No intake/output data recorded.  Labs: Results for orders placed or performed during the hospital encounter of 11/19/20 (from the past 24 hour(s))  Glucose, capillary     Status: None   Collection Time: 11/20/20 11:02 PM  Result Value Ref Range   Glucose-Capillary 90 70 - 99 mg/dL  Glucose, capillary     Status: None   Collection Time: 11/21/20  1:09 AM  Result Value Ref Range   Glucose-Capillary 72 70 - 99 mg/dL  Glucose, capillary     Status: None   Collection Time: 11/21/20  3:10 AM  Result Value Ref Range   Glucose-Capillary 78 70 - 99 mg/dL  Glucose, capillary     Status: None   Collection Time: 11/21/20  4:56 AM  Result Value Ref Range   Glucose-Capillary 79 70 - 99 mg/dL  CBC     Status: Abnormal   Collection Time: 11/21/20  6:49 AM  Result Value Ref Range   WBC 2.7 (L) 4.0 - 10.5 K/uL   RBC 2.43 (L) 3.87 - 5.11 MIL/uL   Hemoglobin 7.5 (L) 12.0 - 15.0 g/dL   HCT 23.7 (L) 62.8 - 31.5 %   MCV 89.3 80.0 - 100.0 fL   MCH 30.9 26.0 - 34.0 pg   MCHC 34.6 30.0 - 36.0 g/dL   RDW 17.6 16.0 - 73.7 %   Platelets 129 (L) 150 - 400 K/uL   nRBC 0.0 0.0 - 0.2 %  Vitamin B12     Status: None   Collection Time: 11/21/20  6:49 AM  Result Value Ref Range   Vitamin B-12 288 180 - 914 pg/mL  Folate, serum, performed  at [facility] lab     Status: None   Collection Time: 11/21/20  6:49 AM  Result Value Ref Range   Folate 36.0 >5.9 ng/mL  Comprehensive metabolic panel     Status: Abnormal   Collection Time: 11/21/20  6:49 AM  Result Value Ref Range   Sodium 136 135 - 145 mmol/L   Potassium 4.1 3.5 - 5.1 mmol/L   Chloride 108 98 - 111 mmol/L   CO2 22 22 - 32 mmol/L   Glucose, Bld 121 (H) 70 - 99 mg/dL   BUN 9 6 - 20 mg/dL   Creatinine, Ser 1.06 (H) 0.44 - 1.00 mg/dL   Calcium 8.1 (L) 8.9 - 10.3 mg/dL   Total Protein 5.6 (L) 6.5 - 8.1 g/dL   Albumin 2.5 (L) 3.5 - 5.0 g/dL   AST 35 15 - 41 U/L   ALT 16 0 - 44 U/L   Alkaline Phosphatase 39 38 - 126 U/L   Total Bilirubin 0.6 0.3 - 1.2 mg/dL   GFR, Estimated >26 >94 mL/min   Anion gap  6 5 - 15  TSH     Status: None   Collection Time: 11/21/20  6:49 AM  Result Value Ref Range   TSH 3.260 0.350 - 4.500 uIU/mL  Resp Panel by RT-PCR (Flu A&B, Covid) Nasopharyngeal Swab     Status: None   Collection Time: 11/21/20  7:29 AM   Specimen: Nasopharyngeal Swab; Nasopharyngeal(NP) swabs in vial transport medium  Result Value Ref Range   SARS Coronavirus 2 by RT PCR NEGATIVE NEGATIVE   Influenza A by PCR NEGATIVE NEGATIVE   Influenza B by PCR NEGATIVE NEGATIVE  Protein / creatinine ratio, urine     Status: Abnormal   Collection Time: 11/21/20  8:29 AM  Result Value Ref Range   Creatinine, Urine 127 mg/dL   Total Protein, Urine 131 mg/dL   Protein Creatinine Ratio 1.03 (H) 0.00 - 0.15 mg/mg[Cre]  Glucose, capillary     Status: Abnormal   Collection Time: 11/21/20  9:02 AM  Result Value Ref Range   Glucose-Capillary 114 (H) 70 - 99 mg/dL  Glucose, capillary     Status: Abnormal   Collection Time: 11/21/20 11:20 AM  Result Value Ref Range   Glucose-Capillary 110 (H) 70 - 99 mg/dL  Glucose, capillary     Status: Abnormal   Collection Time: 11/21/20  1:03 PM  Result Value Ref Range   Glucose-Capillary 124 (H) 70 - 99 mg/dL  Glucose, capillary      Status: Abnormal   Collection Time: 11/21/20  3:18 PM  Result Value Ref Range   Glucose-Capillary 132 (H) 70 - 99 mg/dL  Urine Drug Screen, Qualitative ([facility] only)     Status: Abnormal   Collection Time: 11/21/20  6:11 PM  Result Value Ref Range   Tricyclic, Ur Screen POSITIVE (A) NONE DETECTED   Amphetamines, Ur Screen NONE DETECTED NONE DETECTED   MDMA (Ecstasy)Ur Screen NONE DETECTED NONE DETECTED   Cocaine Metabolite,Ur [place] NONE DETECTED NONE DETECTED   Opiate, Ur Screen NONE DETECTED NONE DETECTED   Phencyclidine (PCP) Ur S NONE DETECTED NONE DETECTED   Cannabinoid 50 Ng, Ur [place] NONE DETECTED NONE DETECTED   Barbiturates, Ur Screen NONE DETECTED NONE DETECTED   Benzodiazepine, Ur Scrn NONE DETECTED NONE DETECTED   Methadone Scn, Ur NONE DETECTED NONE DETECTED  Urinalysis, Routine w reflex microscopic Urine, Clean Catch     Status: Abnormal   Collection Time: 11/21/20  6:11 PM  Result Value Ref Range   Color, Urine AMBER (A) YELLOW   APPearance HAZY (A) CLEAR   Specific Gravity, Urine 1.017 1.005 - 1.030   pH 5.0 5.0 - 8.0   Glucose, UA NEGATIVE NEGATIVE mg/dL   Hgb urine dipstick LARGE (A) NEGATIVE   Bilirubin Urine NEGATIVE NEGATIVE   Ketones, ur 20 (A) NEGATIVE mg/dL   Protein, ur 150 (A) NEGATIVE mg/dL   Nitrite NEGATIVE NEGATIVE   Leukocytes,Ua TRACE (A) NEGATIVE   RBC / HPF 11-20 0 - 5 RBC/hpf   WBC, UA 6-10 0 - 5 WBC/hpf   Bacteria, UA RARE (A) NONE SEEN   Squamous Epithelial / LPF 0-5 0 - 5   Mucus PRESENT    Hyaline Casts, UA PRESENT   Glucose, capillary     Status: Abnormal   Collection Time: 11/21/20  6:13 PM  Result Value Ref Range   Glucose-Capillary 141 (H) 70 - 99 mg/dL  CBC     Status: Abnormal   Collection Time: 11/21/20  7:37 PM  Result Value Ref Range   WBC  4.9 4.0 - 10.5 K/uL   RBC 2.42 (L) 3.87 - 5.11 MIL/uL   Hemoglobin 7.5 (L) 12.0 - 15.0 g/dL   HCT 49.7 (L) 02.6 - 37.8 %   MCV 90.5 80.0 - 100.0 fL   MCH 31.0 26.0 - 34.0 pg   MCHC 34.2 30.0 -  36.0 g/dL   RDW 58.8 50.2 - 77.4 %   Platelets 134 (L) 150 - 400 K/uL   nRBC 0.0 0.0 - 0.2 %  Lactic acid, plasma     Status: None   Collection Time: 11/21/20  7:37 PM  Result Value Ref Range   Lactic Acid, Venous 0.7 0.5 - 1.9 mmol/L  D-dimer, quantitative     Status: Abnormal   Collection Time: 11/21/20  7:37 PM  Result Value Ref Range   D-Dimer, Quant 3.99 (H) 0.00 - 0.50 ug/mL-FEU  Procalcitonin - Baseline     Status: None   Collection Time: 11/21/20  7:37 PM  Result Value Ref Range   Procalcitonin 0.26 ng/mL  Troponin I (High Sensitivity)     Status: None   Collection Time: 11/21/20  7:37 PM  Result Value Ref Range   Troponin I (High Sensitivity) 9 <18 ng/L  Fibrinogen     Status: None   Collection Time: 11/21/20  7:37 PM  Result Value Ref Range   Fibrinogen 469 210 - 475 mg/dL  Troponin I (High Sensitivity)     Status: None   Collection Time: 11/21/20  9:36 PM  Result Value Ref Range   Troponin I (High Sensitivity) 8 <18 ng/L  Glucose, capillary     Status: None   Collection Time: 11/21/20  9:57 PM  Result Value Ref Range   Glucose-Capillary 99 70 - 99 mg/dL    Medications    Current Discharge Medication List     CONTINUE these medications which have NOT CHANGED   Details  aspirin EC 81 MG tablet Take 81 mg by mouth daily. Swallow whole.    Doxylamine-Pyridoxine (DICLEGIS PO) Take by mouth as needed (for nausea and vomiting).    famotidine (PEPCID) 20 MG tablet Take 20 mg by mouth 2 (two) times daily.    ondansetron (ZOFRAN ODT) 4 MG disintegrating tablet Take 1 tablet (4 mg total) by mouth every 8 (eight) hours as needed for nausea or vomiting. Qty: 20 tablet, Refills: 1    Prenatal Vit-Fe Fumarate-FA (PRENATAL MULTIVITAMIN) TABS tablet Take 1 tablet by mouth daily at 12 noon.    prochlorperazine (COMPAZINE) 10 MG tablet Take 10 mg by mouth every 6 (six) hours as needed for nausea or vomiting.    ferrous sulfate 324 MG TBEC Take 324 mg by mouth.     insulin aspart (NOVOLOG) 100 UNIT/ML injection Inject 0-12 Units into the skin 3 (three) times daily before meals. Qty: 10 mL, Refills: 11   Comments: Novolog 1 unit for every 15 grams of CHO  -Novolog correction- 181-220 mg/dL- 1 unit, 128-786 mg/dL-2 units, 767-209 mg/dL- 3 units, 470-962 mg/dL-4 units, 836-629 mg/dL- 5 units, 476-546 mg/dL- 6 units          Assessment:    Chest pain shortness of breath and decreased oxygen saturation-possibly the picture of PE.  Patient with a fever which with the above noted to trigger the sepsis protocol.  Plan:    Hospitalist were consulted and involved.  Subsequent D-dimer was 3.99-patient sent for CTA.  Lower extremity Dopplers were also performed.  I consulted MFM and we have decided to begin Lovenox  while awaiting the CTA results.  Subsequent sepsis labs with the exception of cultures (pending)  have shown to be negative.  Previous hematology consult revealed the following findings:   Acute on chronic anemia during second trimester pregnancy. Pathology smear showed no schistocytes, blasts. Reticulocyte panel showed normal reticulocyte percentage-indicated possible underproduction. Iron saturation 10, TIBC 228, ferritin 626.  Normal bilirubin, less likely hemolysis. S/p 1 PRBC transfusion. MFM has subsequently recommended transfer to [facility] should her CTA come back positive for PE.  She can then undergo continued work-up for hematologic abnormalities as noted above as well as involving infectious disease.  I have discussed all the above with the patient in detail.  All questions were answered.  I believe she has an adequate understanding of her current medical condition work-up and possible future treatment options as well as possible transfer to [place].  I spent greater than 200 minutes in the direct care of this patient today.  Elonda Husky, M.D. 11/21/2020 10:38 PM

## 2020-11-21 NOTE — Progress Notes (Signed)
Hematology/Oncology Progress Note [facility] [phone] [phone]  Patient Care Team: Hildred Laser, MD as PCP - General (Obstetrics and Gynecology)   Name of the patient: Ashley Camacho  [id number]  17-Mar-1991  Date of visit: 11/21/20   INTERVAL HISTORY-  Continues to have nausea and vomiting.  Status post 1 unit of PRBC yesterday, post transfusion H&H was 7.3.  Today CBC showed a hemoglobin of 7.5. Status post 1 dose of IV Venofer treatments and tolerates well.    Review of systems- Review of Systems  Unable to perform ROS: Acuity of condition   No Known Allergies  Patient Active Problem List   Diagnosis Date Noted   Social problem 11/21/2020   Nausea and vomiting during pregnancy prior to [redacted] weeks gestation 11/21/2020   Indication for care in labor and delivery, antepartum 11/20/2020   Anemia affecting pregnancy in second trimester    Absolute anemia    AKI (acute kidney injury) (HCC)    Nausea and vomiting during pregnancy 10/23/2020   Adjustment disorder with mixed anxiety and depressed mood 05/14/2016   DKA (diabetic ketoacidoses) 04/03/2016     Past Medical History:  Diagnosis Date   Anemia    Diabetes mellitus without complication (HCC)    Gastroparesis    Hypertension    Type 1 diabetes (HCC)      Past Surgical History:  Procedure Laterality Date   CESAREAN SECTION     MOUTH SURGERY      Social History   Socioeconomic History   Marital status: Single    Spouse name: Not on file   Number of children: Not on file   Years of education: Not on file   Highest education level: Not on file  Occupational History   Occupation: home maker  Tobacco Use   Smoking status: Never   Smokeless tobacco: Never  Vaping Use   Vaping Use: Never used  Substance and Sexual Activity   Alcohol use: No   Drug use: No   Sexual activity: Yes    Birth control/protection: None  Other Topics Concern   Not on file   Social History Narrative   Not on file   Social Determinants of Health   Financial Resource Strain: Not on file  Food Insecurity: Not on file  Transportation Needs: Not on file  Physical Activity: Not on file  Stress: Not on file  Social Connections: Not on file  Intimate Partner Violence: Not on file     Family History  Problem Relation Age of Onset   Diabetes type I Father    CAD Father    CAD Paternal Grandmother    CAD Paternal Grandfather    Breast cancer Mother    Ovarian cancer Neg Hx      Current Facility-Administered Medications:    acetaminophen (TYLENOL) tablet 650 mg, 650 mg, Oral, Q4H PRN **OR** acetaminophen (TYLENOL) suppository 650 mg, 650 mg, Rectal, Q4H PRN, Doroteo Glassman, RPH, 650 mg at 11/20/20 1154   calcium carbonate (TUMS - dosed in mg elemental calcium) chewable tablet 400 mg of elemental calcium, 2 tablet, Oral, Q4H PRN, Linzie Collin, MD, 400 mg of elemental calcium at 11/20/20 0925   dextrose 5 % in lactated ringers infusion, , Intravenous, Continuous, Linzie Collin, MD, Last Rate: 100 mL/hr at 11/21/20 1327, New Bag at 11/21/20 1327   insulin pump, , Subcutaneous, Q4H, Linzie Collin, MD, Given at 11/21/20 1131   lactated ringers infusion, , Intravenous, Continuous,  Linzie CollinEvans, David James, MD, Last Rate: 100 mL/hr at 11/20/20 1606, Restarted at 11/20/20 1606   metoCLOPramide (REGLAN) injection 10 mg, 10 mg, Intravenous, Q6H, Hildred Laserherry, Anika, MD, 10 mg at 11/21/20 1213   ondansetron (ZOFRAN-ODT) disintegrating tablet 8 mg, 8 mg, Oral, Q8H, Cherry, Anika, MD, 8 mg at 11/21/20 1004   pantoprazole (PROTONIX) EC tablet 40 mg, 40 mg, Oral, Daily, Linzie CollinEvans, David James, MD, 40 mg at 11/21/20 1520   prenatal multivitamin tablet 1 tablet, 1 tablet, Oral, Q1200, Linzie CollinEvans, David James, MD   promethazine (PHENERGAN) 25 mg in sodium chloride 0.9 % 50 mL IVPB, 25 mg, Intravenous, Q6H PRN, Linzie CollinEvans, David James, MD, Last Rate: 200 mL/hr at 11/21/20 1329, 25 mg at  11/21/20 1329   scopolamine (TRANSDERM-SCOP) 1 MG/3DAYS 1.5 mg, 1 patch, Transdermal, Q72H, Hildred Laserherry, Anika, MD, 1.5 mg at 11/21/20 0923   zolpidem (AMBIEN) tablet 5 mg, 5 mg, Oral, QHS PRN, Linzie CollinEvans, David James, MD   Physical exam:  Vitals:   11/21/20 0724 11/21/20 1117 11/21/20 1513 11/21/20 1514  BP: 138/76 138/77  130/68  Pulse: (!) 103 (!) 105  (!) 110  Resp:  17    Temp:  98.8 F (37.1 C) 99.5 F (37.5 C)   TempSrc:  Oral Oral   SpO2: 95%     Weight:      Height:       Physical Exam Constitutional:      Appearance: She is not diaphoretic.  HENT:     Head: Normocephalic and atraumatic.     Nose: Nose normal.     Mouth/Throat:     Pharynx: No oropharyngeal exudate.  Eyes:     General: No scleral icterus.    Pupils: Pupils are equal, round, and reactive to light.  Cardiovascular:     Rate and Rhythm: Normal rate and regular rhythm.     Heart sounds: No murmur heard. Pulmonary:     Effort: Pulmonary effort is normal. No respiratory distress.     Breath sounds: No rales.  Chest:     Chest wall: No tenderness.  Abdominal:     Comments: Gravid uterus  Musculoskeletal:        General: Normal range of motion.     Cervical back: Normal range of motion and neck supple.  Skin:    General: Skin is warm and dry.     Coloration: Skin is pale.     Findings: No erythema.  Neurological:     Mental Status: She is alert and oriented to person, place, and time.     Cranial Nerves: No cranial nerve deficit.     Motor: No abnormal muscle tone.     Coordination: Coordination normal.  Psychiatric:        Mood and Affect: Affect normal.       CMP Latest Ref Rng & Units 11/21/2020  Glucose 70 - 99 mg/dL 161(W121(H)  BUN 6 - 20 mg/dL 9  Creatinine 9.600.44 - 4.541.00 mg/dL 0.98(J1.01(H)  Sodium 191135 - 478145 mmol/L 136  Potassium 3.5 - 5.1 mmol/L 4.1  Chloride 98 - 111 mmol/L 108  CO2 22 - 32 mmol/L 22  Calcium 8.9 - 10.3 mg/dL 8.1(L)  Total Protein 6.5 - 8.1 g/dL 2.9(F5.6(L)  Total Bilirubin 0.3 - 1.2  mg/dL 0.6  Alkaline Phos 38 - 126 U/L 39  AST 15 - 41 U/L 35  ALT 0 - 44 U/L 16   CBC Latest Ref Rng & Units 11/21/2020  WBC 4.0 - 10.5 K/uL 2.7(L)  Hemoglobin 12.0 - 15.0 g/dL 7.5(L)  Hematocrit 36.0 - 46.0 % 21.7(L)  Platelets 150 - 400 K/uL 129(L)    RADIOGRAPHIC STUDIES: I have personally reviewed the radiological images as listed and agreed with the findings in the report. [place] MFM OB COMP + 14 WK  Result Date: 11/13/2020 ----------------------------------------------------------------------  OBSTETRICS REPORT                       (Signed Final 11/13/2020 09:13 am) ---------------------------------------------------------------------- Patient Info  ID #:       [id number]                          D.O.B.:  1990/10/13 (29 yrs)  Name:       ESME NEGRO [facility]                 Visit Date: 11/11/2020 05:45 pm ---------------------------------------------------------------------- Performed By  Attending:        Lin Landsman      Ref. Address:     [facility]                    MD                                                             [facility]                                                             [address]                                                             [address]                                                             [address]                                                             [place]                                                             [address]  Performed By:     Blair Heys      [facility]         [facility] at                                                             [facility]  Referred By:      Linzie Collin MD ---------------------------------------------------------------------- Orders  #  Description                           Code        Ordered By  1  [place] MFM OB COMP + 14 WK                76805.01    DAVID EVANS  ----------------------------------------------------------------------  #  Order #                     Accession #                Episode #  1  [id number]                   [id number]                 [id number] ---------------------------------------------------------------------- Indications  [redacted] weeks gestation of pregnancy                Z3A.20  Anemia during pregnancy in second trimester    O99.012  Pre-existing diabetes, type 1, in pregnancy,   O24.012  second trimester ---------------------------------------------------------------------- Fetal Evaluation  Num Of Fetuses:         1  Fetal Heart Rate(bpm):  137  Cardiac Activity:       Observed  Presentation:           Variable  Placenta:               Posterior  Amniotic Fluid  AFI FV:      Within normal limits ---------------------------------------------------------------------- Biometry  BPD:      48.5  mm     G. Age:  20w 5d         71  %    CI:        73.74   %    70 - 86                                                          FL/HC:      18.0   %    16.8 - 19.8  HC:      179.4  mm     G. Age:  20w 3d         53  %  HC/AC:      1.19        1.09 - 1.39  AC:      150.2  mm     G. Age:  20w 2d         48  %    FL/BPD:     66.6   %  FL:       32.3  mm     G. Age:  20w 0d         39  %    FL/AC:      21.5   %    20 - 24  HUM:      31.2  mm     G. Age:  20w 3d         59  %  Est. FW:     339  gm    0 lb 12 oz      49  % ---------------------------------------------------------------------- Gestational Age  U/S Today:     20w 3d                                        EDD:   03/28/21  Best:          20w 1d     Det. By:  Marcella Dubs         EDD:   03/30/21                                      (08/27/20) ---------------------------------------------------------------------- Anatomy  Cranium:               Appears normal         Aortic Arch:            Appears normal  Cavum:                 Appears normal         Ductal Arch:            Appears normal   Ventricles:            Appears normal         Diaphragm:              Appears normal  Choroid Plexus:        Appears normal         Stomach:                Appears normal, left                                                                        sided  Cerebellum:            Appears normal         Abdomen:                Appears normal  Posterior Fossa:       Appears normal         Abdominal Wall:         Appears nml (  cord                                                                        insert, abd wall)  Nuchal Fold:           Appears normal         Cord Vessels:           Appears normal (3                                                                        vessel cord)  Lips:                  Appears normal         Kidneys:                Appear normal  Thoracic:              Appears normal         Bladder:                Appears normal  Heart:                 Appears normal         Spine:                  Appears normal                         (4CH, axis, and                         situs)  RVOT:                  Appears normal         Upper Extremities:      Appears normal  LVOT:                  Appears normal         Lower Extremities:      Appears normal ---------------------------------------------------------------------- Cervix Uterus Adnexa  Cervix  Length:            3.9  cm. ---------------------------------------------------------------------- Impression  Single intrauterine pregnancy here for a detailed anatomy  due to type 1 diabetes  Ms. Vanbergen had a low risk NIPS and AFP.  Normal anatomy with measurements consistent with dates  There is good fetal movement and amniotic fluid volume  I reviewed today's study and recommend serial growth, fetal  echocardiogram and initiation of weekly testing at 32 weeks.  We discussed the increased risk for fetal macrosomia,  cardiac defects, maternal and fetal birth trauma with  temporary and/or permenant damage, stillbirth and neonatal  ICU admission.  In  addition we discussed starting daily low dose ASA for the  prevention of preeclampsia.  She reports good blood sugars overall are better she was  having low's due  to N/V. ---------------------------------------------------------------------- Recommendations  Follow up growth in 4 weeks  Fetal echocardiogram between 22-26 weeks. ----------------------------------------------------------------------               Lin Landsman, MD Electronically Signed Final Report   11/13/2020 09:13 am ----------------------------------------------------------------------   Assessment and plan-   #Acute on chronic anemia during second trimester pregnancy. Pathology smear showed no schistocytes, blasts. Reticulocyte panel showed normal reticulocyte percentage-indicated possible underproduction. Iron saturation 10, TIBC 228, ferritin 626.  Normal bilirubin, less likely hemolysis. S/p 1 PRBC transfusion. S/P IV Venofer 200 mg x 1. Hemoglobin 7.5 today.  Continue monitor CBC daily.  Low borderline vitamin B12 level, recommend vitamin B12 intramuscular injections 1000 MCG daily x3. Copper level, zinc level are pending.   #AKI, IV hydration per OB/GYN team. #Type 1 diabetes.  And gastroparesis management per OB/GYN team.  Consider GI consultation if symptoms are persistent.  Thank you for allowing me to participate in the care of this patient.   Rickard Patience, MD, PhD Hematology Oncology [facility] Pager- [phone] 11/21/2020

## 2020-11-21 NOTE — Progress Notes (Signed)
172-pt meter 132-hospital  Cont. Rate of .25U/hr per patient insulin

## 2020-11-21 NOTE — Progress Notes (Signed)
[name] MD notified of patient vitals. Verbal order given to Vergia Alberts for Lactic Acid and blood cultures. Evans will be at bedside in 10 minutes.

## 2020-11-21 NOTE — Progress Notes (Signed)
CBG 114 per hospital glucometer Blood glucose 157 per pt   Per pt .25U of insulin per hr being given

## 2020-11-21 NOTE — Progress Notes (Signed)
Blood glucose 110 per hospital glucometer 148 per pts glucometer  Pt reports basal rate at .25 Units per hour

## 2020-11-21 NOTE — Progress Notes (Signed)
RN at bedside at 1755 when pt had just got back from bathroom. Pt reports feeling dizzy. VS taken. BP 148/79. HR 120's. RR 22. O2 sat 89. O2 administered. MD aware and on way to see pt

## 2020-11-22 ENCOUNTER — Inpatient Hospital Stay: Payer: Medicaid Other

## 2020-11-22 LAB — PROCALCITONIN: Procalcitonin: 0.3 ng/mL

## 2020-11-22 LAB — GLUCOSE, CAPILLARY
Glucose-Capillary: 94 mg/dL (ref 70–99)
Glucose-Capillary: 146 mg/dL — ABNORMAL HIGH (ref 70–99)
Glucose-Capillary: 220 mg/dL — ABNORMAL HIGH (ref 70–99)
Glucose-Capillary: 227 mg/dL — ABNORMAL HIGH (ref 70–99)
Glucose-Capillary: 226 mg/dL — ABNORMAL HIGH (ref 70–99)
Glucose-Capillary: 250 mg/dL — ABNORMAL HIGH (ref 70–99)
Glucose-Capillary: 260 mg/dL — ABNORMAL HIGH (ref 70–99)
Glucose-Capillary: 209 mg/dL — ABNORMAL HIGH (ref 70–99)
Glucose-Capillary: 182 mg/dL — ABNORMAL HIGH (ref 70–99)
Glucose-Capillary: 178 mg/dL — ABNORMAL HIGH (ref 70–99)
Glucose-Capillary: 175 mg/dL — ABNORMAL HIGH (ref 70–99)

## 2020-11-22 LAB — STREP PNEUMONIAE URINARY ANTIGEN: Strep Pneumo Urinary Antigen: NEGATIVE

## 2020-11-22 LAB — HEMOGLOBIN A1C
Hgb A1c MFr Bld: 5.8 % — ABNORMAL HIGH (ref 4.8–5.6)
Mean Plasma Glucose: 120 mg/dL

## 2020-11-22 LAB — HIV ANTIBODY (ROUTINE TESTING W REFLEX): HIV Screen 4th Generation wRfx: NONREACTIVE

## 2020-11-22 MED ORDER — HYDROXYZINE HCL 50 MG/ML IM SOLN
25.0000 mg | Freq: Once | INTRAMUSCULAR | Status: AC
  Administered 2020-11-22: 25 mg via INTRAMUSCULAR
  Filled 2020-11-22: qty 0.5

## 2020-11-22 MED ORDER — LACTATED RINGERS IV SOLN
INTRAVENOUS | Status: DC
  Administered 2020-11-23: 50 mL via INTRAVENOUS

## 2020-11-22 MED ORDER — PHENOL 1.4 % MT LIQD
1.0000 | OROMUCOSAL | Status: DC | PRN
  Administered 2020-11-22: 1 via OROMUCOSAL
  Filled 2020-11-22: qty 177

## 2020-11-22 MED ORDER — SODIUM CHLORIDE 0.9 % IV SOLN
2.0000 g | INTRAVENOUS | Status: DC
  Administered 2020-11-22: 2 g via INTRAVENOUS
  Filled 2020-11-22 (×2): qty 20

## 2020-11-22 MED ORDER — SODIUM CHLORIDE 0.9 % IV SOLN
3.0000 g | Freq: Four times a day (QID) | INTRAVENOUS | Status: DC
  Administered 2020-11-22 – 2020-11-24 (×9): 3 g via INTRAVENOUS
  Filled 2020-11-22: qty 3
  Filled 2020-11-22: qty 8
  Filled 2020-11-22: qty 3
  Filled 2020-11-22 (×4): qty 8
  Filled 2020-11-22: qty 3
  Filled 2020-11-22 (×3): qty 8
  Filled 2020-11-22 (×2): qty 3

## 2020-11-22 MED ORDER — GUAIFENESIN ER 600 MG PO TB12
600.0000 mg | ORAL_TABLET | Freq: Two times a day (BID) | ORAL | Status: DC
  Administered 2020-11-23 (×2): 600 mg via ORAL
  Filled 2020-11-22 (×3): qty 1

## 2020-11-22 MED ORDER — SODIUM CHLORIDE 0.9 % IV SOLN
500.0000 mg | INTRAVENOUS | Status: DC
  Administered 2020-11-22 – 2020-11-24 (×3): 500 mg via INTRAVENOUS
  Filled 2020-11-22 (×3): qty 500

## 2020-11-22 MED ORDER — METRONIDAZOLE 500 MG/100ML IV SOLN
500.0000 mg | Freq: Three times a day (TID) | INTRAVENOUS | Status: DC
  Administered 2020-11-22: 500 mg via INTRAVENOUS
  Filled 2020-11-22 (×2): qty 100

## 2020-11-22 NOTE — Progress Notes (Signed)
[facility]  - [place] at [facility]   PATIENT NAME: Ashley Camacho    MR#:  [id number]  DATE OF BIRTH:  Jul 06, 1990  SUBJECTIVE:   Dry heaves. No sob Tolerating ice chips REVIEW OF SYSTEMS:   Review of Systems  Constitutional:  Negative for chills, fever and weight loss.  HENT:  Negative for ear discharge, ear pain and nosebleeds.   Eyes:  Negative for blurred vision, pain and discharge.  Respiratory:  Positive for cough. Negative for sputum production, shortness of breath, wheezing and stridor.   Cardiovascular:  Negative for chest pain, palpitations, orthopnea and PND.  Gastrointestinal:  Positive for nausea and vomiting. Negative for abdominal pain and diarrhea.  Genitourinary:  Negative for frequency and urgency.  Musculoskeletal:  Negative for back pain and joint pain.  Neurological:  Positive for weakness. Negative for sensory change, speech change and focal weakness.  Psychiatric/Behavioral:  Negative for depression and hallucinations. The patient is not nervous/anxious.   Tolerating Diet: Tolerating PT:   DRUG ALLERGIES:  No Known Allergies  VITALS:  Blood pressure 129/79, pulse (!) 104, temperature 98.8 F (37.1 C), resp. rate 18, height 5\' 1"  (1.549 m), weight 57 kg, SpO2 93 %.  PHYSICAL EXAMINATION:   Physical Exam  GENERAL:  30 y.o.-year-old patient lying in the bed with no acute distress.  LUNGS: Normal breath sounds bilaterally, no wheezing, rales, rhonchi. No use of accessory muscles of respiration.  CARDIOVASCULAR: S1, S2 normal. No murmurs, rubs, or gallops. tachycardia ABDOMEN: Soft, nontender, nondistended. Bowel sounds present. No organomegaly or mass.  EXTREMITIES: No cyanosis, clubbing or edema b/l.    NEUROLOGIC: nonfocal PSYCHIATRIC:  patient is alert and oriented x 3.  SKIN: No obvious rash, lesion, or ulcer.   LABORATORY PANEL:  CBC Recent Labs  Lab 11/21/20 1937  WBC 4.9  HGB 7.5*  HCT 21.9*  PLT 134*    Chemistries   Recent Labs  Lab 11/21/20 0649  NA 136  K 4.1  CL 108  CO2 22  GLUCOSE 121*  BUN 9  CREATININE 1.01*  CALCIUM 8.1*  AST 35  ALT 16  ALKPHOS 39  BILITOT 0.6   Cardiac Enzymes No results for input(s): TROPONINI in the last 168 hours. RADIOLOGY:  CT Angio Chest Pulmonary Embolism (PE) W or WO Contrast  Result Date: 11/22/2020 CLINICAL DATA:  Pulmonary embolus suspected with low to intermediate probability. Positive D-dimer. Shortness of breath and chest pain. EXAM: CT ANGIOGRAPHY CHEST WITH CONTRAST TECHNIQUE: Multidetector CT imaging of the chest was performed using the standard protocol during bolus administration of intravenous contrast. Multiplanar CT image reconstructions and MIPs were obtained to evaluate the vascular anatomy. CONTRAST:  58mL OMNIPAQUE IOHEXOL 350 MG/ML SOLN COMPARISON:  05/14/2016 FINDINGS: Cardiovascular: Good opacification of the central and segmental pulmonary arteries. No focal filling defects. No evidence of significant pulmonary embolus. Normal heart size. No pericardial effusions. Normal caliber thoracic aorta. No dissection. Mediastinum/Nodes: No significant lymphadenopathy in the chest. Esophagus is decompressed. Thyroid gland is unremarkable. Lungs/Pleura: Motion artifact limits examination. There are moderate bilateral pleural effusions with basilar atelectasis or consolidation. Perihilar infiltrates and interstitial changes in the lungs likely to represent edema. Focal nodular infiltrates may represent multifocal pneumonia. Upper Abdomen: No acute abnormalities demonstrated in the visualized upper abdomen. Musculoskeletal: No chest wall abnormality. No acute or significant osseous findings. Review of the MIP images confirms the above findings. IMPRESSION: 1. No evidence of significant pulmonary embolus. 2. Moderate bilateral pleural effusions with bilateral basilar atelectasis or consolidation.  3. Probable perihilar edema. 4. Additional nodular infiltrative foci  throughout the lungs may represent superimposed multifocal pneumonia. Electronically Signed   By: Burman Nieves M.D.   On: 11/22/2020 01:10   US Venous Img Lower Bilateral (DVT)  Result Date: 11/22/2020 CLINICAL DATA:  Twenty-one week gravid female with bilateral lower extremity edema EXAM: BILATERAL LOWER EXTREMITY VENOUS DOPPLER ULTRASOUND TECHNIQUE: Gray-scale sonography with graded compression, as well as color Doppler and duplex ultrasound were performed to evaluate the lower extremity deep venous systems from the level of the common femoral vein and including the common femoral, femoral, profunda femoral, popliteal and calf veins including the posterior tibial, peroneal and gastrocnemius veins when visible. The superficial great saphenous vein was also interrogated. Spectral Doppler was utilized to evaluate flow at rest and with distal augmentation maneuvers in the common femoral, femoral and popliteal veins. COMPARISON:  None. FINDINGS: RIGHT LOWER EXTREMITY Common Femoral Vein: No evidence of thrombus. Normal compressibility, respiratory phasicity and response to augmentation. Saphenofemoral Junction: No evidence of thrombus. Normal compressibility and flow on color Doppler imaging. Profunda Femoral Vein: No evidence of thrombus. Normal compressibility and flow on color Doppler imaging. Femoral Vein: No evidence of thrombus. Normal compressibility, respiratory phasicity and response to augmentation. Popliteal Vein: No evidence of thrombus. Normal compressibility, respiratory phasicity and response to augmentation. Calf Veins: No evidence of thrombus. Normal compressibility and flow on color Doppler imaging. Superficial Great Saphenous Vein: No evidence of thrombus. Normal compressibility. Venous Reflux:  None. Other Findings:  None. LEFT LOWER EXTREMITY Common Femoral Vein: No evidence of thrombus. Normal compressibility, respiratory phasicity and response to augmentation. Saphenofemoral Junction: No  evidence of thrombus. Normal compressibility and flow on color Doppler imaging. Profunda Femoral Vein: No evidence of thrombus. Normal compressibility and flow on color Doppler imaging. Femoral Vein: No evidence of thrombus. Normal compressibility, respiratory phasicity and response to augmentation. Popliteal Vein: No evidence of thrombus. Normal compressibility, respiratory phasicity and response to augmentation. Calf Veins: No evidence of thrombus. Normal compressibility and flow on color Doppler imaging. Superficial Great Saphenous Vein: No evidence of thrombus. Normal compressibility. Venous Reflux:  None. Other Findings:  None. IMPRESSION: No evidence of deep venous thrombosis in either lower extremity. Electronically Signed   By: Malachy Moan M.D.   On: 11/22/2020 08:10   ASSESSMENT AND PLAN:  EILIYAH REH is a 30 y.o. female with medical history significant for insulin-dependent diabetes mellitus on insulin pump, G2 P1, 20 weeks 3 days, gastroparesis,admitted to labor and delivery observation unit for chief concerns of persistent nausea and vomiting.  IM consulted for fever and leukocytosis.  # Intractable N/V in the setting of pregnancy, ?Gastroparesis --IVF --prn antiemtics --CLD  #  sepsis  due to Multifocal pneumonia --presented  with elevated heart rate, leukocytosis, fever --Unasyn +zithromax --BC negative so far - Agree with lactic acid x2, blood cultures x2, UA and urine culture, troponins x2 - COVID PCR was negative on 11/20/2020 and 11/21/2020 - Recommend to maintain MAP greater than 65 - wbc 2.7--4.9 --PCT- 0.30 -- lactic acid 0.26  # Severe acute on chronic anemia in setting of iron deficiency and dilutional anemia in setting of pregnancy - Status post 1 unit packed red blood cell - Appreciate hematology recommendation   # Thrombocytopenia-plt remains stable  # Dm-1 on Insulin pump --sugrs trending up --d/c dextrose gtt --pt to take insulin according to  sugars   CODE STATUS: full DVT Prophylaxis :enoxaparin Level of care: Antepartum Status is: Inpatient  Remains inpatient appropriate because:Inpatient  level of care appropriate due to severity of illness  Dispo: The patient is from: Home              Anticipated d/c is to: Home              Patient currently is not medically stable to d/c.   Difficult to place patient No        TOTAL TIME TAKING CARE OF THIS PATIENT: 25 minutes.  >50% time spent on counselling and coordination of care  Note: This dictation was prepared with Dragon dictation along with smaller phrase technology. Any transcriptional errors that result from this process are unintentional.  Enedina Finner M.D    [facility]   CC: Primary care physician; Hildred Laser, MD Patient ID: SMERA GUYETTE, female   DOB: [birth year], 30 y.o.   MRN: [id number]

## 2020-11-22 NOTE — Progress Notes (Signed)
1.85U of correction insulin administered by pt pump

## 2020-11-22 NOTE — Progress Notes (Signed)
CBG 227 

## 2020-11-22 NOTE — Progress Notes (Signed)
Patient ID: Ashley Camacho, female   DOB: 29-Oct-1990, 30 y.o.   MRN: [id number]    Subjective:    She says that she feels a little bit better today.  Continues to experience nausea and "gagging".  States her chest pain is somewhat better.  Still not interested in oral intake of food or liquids.  Remains on nasal cannula oxygen but breathing more comfortably. On a secondary note her insulin pump monitor was removed for CT yesterday.  She is still able to bolus but cannot get glucose readings at this time.  She says she will contact someone at home to bring her another monitor to attach.  Objective:    Patient Vitals for the past 2 hrs:  Pulse Resp SpO2  11/22/20 1100 92 (!) 23 97 %  11/22/20 1059 92 (!) 23 98 %  11/22/20 1058 93 (!) 22 98 %  11/22/20 1057 92 (!) 23 97 %  11/22/20 1056 91 (!) 21 97 %  11/22/20 1055 92 (!) 22 97 %  11/22/20 1054 91 (!) 22 97 %  11/22/20 1053 93 19 98 %  11/22/20 1052 93 (!) 21 98 %  11/22/20 1051 91 (!) 22 98 %  11/22/20 1050 83 (!) 21 98 %  11/22/20 1049 94 (!) 21 98 %  11/22/20 1048 94 (!) 22 97 %  11/22/20 1047 95 (!) 21 99 %  11/22/20 1046 95 (!) 21 98 %  11/22/20 1045 96 20 100 %  11/22/20 1044 94 (!) 22 98 %  11/22/20 1043 94 (!) 23 98 %  11/22/20 1042 94 (!) 22 98 %  11/22/20 1041 94 (!) 22 99 %  11/22/20 1040 93 (!) 22 99 %  11/22/20 1039 94 (!) 21 98 %  11/22/20 1038 94 (!) 24 98 %  11/22/20 1037 95 20 98 %  11/22/20 1036 95 (!) 21 98 %  11/22/20 1035 97 20 98 %  11/22/20 1034 98 (!) 21 98 %  11/22/20 1033 99 (!) 22 98 %  11/22/20 1032 99 (!) 22 98 %  11/22/20 1031 98 19 99 %  11/22/20 1030 99 20 99 %  11/22/20 1029 99 (!) 21 99 %  11/22/20 1028 100 (!) 23 99 %  11/22/20 1027 (!) 101 (!) 24 100 %  11/22/20 1026 100 (!) 21 100 %  11/22/20 1025 100 (!) 22 100 %  11/22/20 1024 100 (!) 23 100 %  11/22/20 1023 100 20 99 %  11/22/20 1022 99 (!) 21 99 %  11/22/20 1021 98 18 99 %  11/22/20 1020 100 (!) 21 98 %  11/22/20 1019 100 (!)  22 99 %  11/22/20 1018 100 (!) 23 99 %  11/22/20 1017 (!) 101 20 99 %  11/22/20 1016 (!) 102 20 99 %  11/22/20 1015 (!) 101 18 99 %  11/22/20 1014 (!) 101 (!) 22 99 %  11/22/20 1013 100 (!) 22 99 %  11/22/20 1012 100 (!) 23 99 %  11/22/20 1011 100 (!) 23 99 %  11/22/20 1010 (!) 102 20 99 %  11/22/20 1009 (!) 101 19 99 %  11/22/20 1008 (!) 104 (!) 21 98 %  11/22/20 1007 100 (!) 23 98 %  11/22/20 1006 99 (!) 22 98 %  11/22/20 1005 (!) 102 (!) 22 99 %  11/22/20 1004 (!) 101 (!) 24 97 %  11/22/20 1003 (!) 102 20 98 %  11/22/20 1002 (!) 101 (!) 21 98 %  11/22/20 1001 (!) 101 (!) 21 98 %  11/22/20 1000 (!) 101 (!) 22 97 %  11/22/20 0959 (!) 101 (!) 22 99 %  11/22/20 0958 (!) 101 (!) 23 98 %  11/22/20 0957 100 (!) 22 99 %  11/22/20 0956 100 (!) 22 99 %  11/22/20 0955 (!) 101 20 98 %  11/22/20 0954 (!) 102 (!) 21 99 %  11/22/20 0953 (!) 101 (!) 24 99 %  11/22/20 0952 100 20 99 %  11/22/20 0951 (!) 102 19 99 %  11/22/20 0950 (!) 102 (!) 23 98 %  11/22/20 0949 (!) 103 18 98 %  11/22/20 0948 (!) 103 20 98 %  11/22/20 0947 (!) 102 (!) 22 97 %  11/22/20 0946 (!) 101 (!) 28 98 %  11/22/20 0945 (!) 102 (!) 28 96 %   No intake/output data recorded.  Labs: Results for orders placed or performed during the hospital encounter of 11/19/20 (from the past 24 hour(s))  Glucose, capillary     Status: Abnormal   Collection Time: 11/21/20  1:03 PM  Result Value Ref Range   Glucose-Capillary 124 (H) 70 - 99 mg/dL  Glucose, capillary     Status: Abnormal   Collection Time: 11/21/20  3:18 PM  Result Value Ref Range   Glucose-Capillary 132 (H) 70 - 99 mg/dL  Urine Drug Screen, Qualitative ([facility] only)     Status: Abnormal   Collection Time: 11/21/20  6:11 PM  Result Value Ref Range   Tricyclic, Ur Screen POSITIVE (A) NONE DETECTED   Amphetamines, Ur Screen NONE DETECTED NONE DETECTED   MDMA (Ecstasy)Ur Screen NONE DETECTED NONE DETECTED   Cocaine Metabolite,Ur [place] NONE DETECTED NONE DETECTED    Opiate, Ur Screen NONE DETECTED NONE DETECTED   Phencyclidine (PCP) Ur S NONE DETECTED NONE DETECTED   Cannabinoid 50 Ng, Ur [place] NONE DETECTED NONE DETECTED   Barbiturates, Ur Screen NONE DETECTED NONE DETECTED   Benzodiazepine, Ur Scrn NONE DETECTED NONE DETECTED   Methadone Scn, Ur NONE DETECTED NONE DETECTED  Urinalysis, Routine w reflex microscopic Urine, Clean Catch     Status: Abnormal   Collection Time: 11/21/20  6:11 PM  Result Value Ref Range   Color, Urine AMBER (A) YELLOW   APPearance HAZY (A) CLEAR   Specific Gravity, Urine 1.017 1.005 - 1.030   pH 5.0 5.0 - 8.0   Glucose, UA NEGATIVE NEGATIVE mg/dL   Hgb urine dipstick LARGE (A) NEGATIVE   Bilirubin Urine NEGATIVE NEGATIVE   Ketones, ur 20 (A) NEGATIVE mg/dL   Protein, ur 681 (A) NEGATIVE mg/dL   Nitrite NEGATIVE NEGATIVE   Leukocytes,Ua TRACE (A) NEGATIVE   RBC / HPF 11-20 0 - 5 RBC/hpf   WBC, UA 6-10 0 - 5 WBC/hpf   Bacteria, UA RARE (A) NONE SEEN   Squamous Epithelial / LPF 0-5 0 - 5   Mucus PRESENT    Hyaline Casts, UA PRESENT   Glucose, capillary     Status: Abnormal   Collection Time: 11/21/20  6:13 PM  Result Value Ref Range   Glucose-Capillary 141 (H) 70 - 99 mg/dL  CBC     Status: Abnormal   Collection Time: 11/21/20  7:37 PM  Result Value Ref Range   WBC 4.9 4.0 - 10.5 K/uL   RBC 2.42 (L) 3.87 - 5.11 MIL/uL   Hemoglobin 7.5 (L) 12.0 - 15.0 g/dL   HCT 27.5 (L) 17.0 - 01.7 %   MCV 90.5 80.0 - 100.0 fL  MCH 31.0 26.0 - 34.0 pg   MCHC 34.2 30.0 - 36.0 g/dL   RDW 63.0 16.0 - 10.9 %   Platelets 134 (L) 150 - 400 K/uL   nRBC 0.0 0.0 - 0.2 %  Lactic acid, plasma     Status: None   Collection Time: 11/21/20  7:37 PM  Result Value Ref Range   Lactic Acid, Venous 0.7 0.5 - 1.9 mmol/L  CULTURE, BLOOD (ROUTINE X 2) w Reflex to ID Panel     Status: None (Preliminary result)   Collection Time: 11/21/20  7:37 PM   Specimen: BLOOD  Result Value Ref Range   Specimen Description BLOOD BLOOD LEFT HAND    Special  Requests      BOTTLES DRAWN AEROBIC AND ANAEROBIC Blood Culture results may not be optimal due to an inadequate volume of blood received in culture bottles   Culture      NO GROWTH < 12 HOURS Performed at [facility], [address], [place], [place] [address]    Report Status PENDING   CULTURE, BLOOD (ROUTINE X 2) w Reflex to ID Panel     Status: None (Preliminary result)   Collection Time: 11/21/20  7:37 PM   Specimen: BLOOD  Result Value Ref Range   Specimen Description BLOOD BLOOD RIGHT FOREARM    Special Requests      BOTTLES DRAWN AEROBIC AND ANAEROBIC Blood Culture results may not be optimal due to an inadequate volume of blood received in culture bottles   Culture      NO GROWTH < 12 HOURS Performed at [facility], [address] [place], [place] [address]    Report Status PENDING   D-dimer, quantitative     Status: Abnormal   Collection Time: 11/21/20  7:37 PM  Result Value Ref Range   D-Dimer, Quant 3.99 (H) 0.00 - 0.50 ug/mL-FEU  Procalcitonin - Baseline     Status: None   Collection Time: 11/21/20  7:37 PM  Result Value Ref Range   Procalcitonin 0.26 ng/mL  Troponin I (High Sensitivity)     Status: None   Collection Time: 11/21/20  7:37 PM  Result Value Ref Range   Troponin I (High Sensitivity) 9 <18 ng/L  Fibrinogen     Status: None   Collection Time: 11/21/20  7:37 PM  Result Value Ref Range   Fibrinogen 469 210 - 475 mg/dL  Troponin I (High Sensitivity)     Status: None   Collection Time: 11/21/20  9:36 PM  Result Value Ref Range   Troponin I (High Sensitivity) 8 <18 ng/L  Glucose, capillary     Status: None   Collection Time: 11/21/20  9:57 PM  Result Value Ref Range   Glucose-Capillary 99 70 - 99 mg/dL  Glucose, capillary     Status: None   Collection Time: 11/21/20 11:48 PM  Result Value Ref Range   Glucose-Capillary 73 70 - 99 mg/dL  Glucose, capillary     Status: None   Collection Time: 11/22/20  1:50 AM  Result Value Ref  Range   Glucose-Capillary 94 70 - 99 mg/dL  Glucose, capillary     Status: Abnormal   Collection Time: 11/22/20  4:09 AM  Result Value Ref Range   Glucose-Capillary 146 (H) 70 - 99 mg/dL  HIV Antibody (routine testing w rflx)     Status: None   Collection Time: 11/22/20  4:29 AM  Result Value Ref Range   HIV Screen 4th Generation wRfx  Non Reactive Non Reactive  Procalcitonin     Status: None   Collection Time: 11/22/20  4:29 AM  Result Value Ref Range   Procalcitonin 0.30 ng/mL  Glucose, capillary     Status: Abnormal   Collection Time: 11/22/20  6:16 AM  Result Value Ref Range   Glucose-Capillary 220 (H) 70 - 99 mg/dL  Glucose, capillary     Status: Abnormal   Collection Time: 11/22/20  8:09 AM  Result Value Ref Range   Glucose-Capillary 227 (H) 70 - 99 mg/dL  Glucose, capillary     Status: Abnormal   Collection Time: 11/22/20 10:09 AM  Result Value Ref Range   Glucose-Capillary 226 (H) 70 - 99 mg/dL    Medications    Current Discharge Medication List     CONTINUE these medications which have NOT CHANGED   Details  aspirin EC 81 MG tablet Take 81 mg by mouth daily. Swallow whole.    Doxylamine-Pyridoxine (DICLEGIS PO) Take by mouth as needed (for nausea and vomiting).    famotidine (PEPCID) 20 MG tablet Take 20 mg by mouth 2 (two) times daily.    ondansetron (ZOFRAN ODT) 4 MG disintegrating tablet Take 1 tablet (4 mg total) by mouth every 8 (eight) hours as needed for nausea or vomiting. Qty: 20 tablet, Refills: 1    Prenatal Vit-Fe Fumarate-FA (PRENATAL MULTIVITAMIN) TABS tablet Take 1 tablet by mouth daily at 12 noon.    prochlorperazine (COMPAZINE) 10 MG tablet Take 10 mg by mouth every 6 (six) hours as needed for nausea or vomiting.    ferrous sulfate 324 MG TBEC Take 324 mg by mouth.    insulin aspart (NOVOLOG) 100 UNIT/ML injection Inject 0-12 Units into the skin 3 (three) times daily before meals. Qty: 10 mL, Refills: 11   Comments: Novolog 1 unit for every  15 grams of CHO  -Novolog correction- 181-220 mg/dL- 1 unit, 741-423 mg/dL-2 units, 953-202 mg/dL- 3 units, 334-356 mg/dL-4 units, 861-683 mg/dL- 5 units, 729-021 mg/dL- 6 units          Assessment:/PLAN    1.  Fetal heart tones normal for 21 weeks.  2.  Multifocal pneumonia-currently being treated with antibiotics. (?  Aspiration ? -Because of her prolonged and constant emesis)   Continue antibiotics. -Being followed by hospitalist service.  3.  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  4.  Nausea and vomiting -patient on multiple antiemetics.  Vistaril seems to have helped to a small degree.  5.  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  We will continue to try to maintain sugars between 100 and 150.  I have spent greater than 120 minutes in the direct care of this patient today.  Elonda Husky, M.D. 11/22/2020 11:44 AM

## 2020-11-23 LAB — LACTATE DEHYDROGENASE: LDH: 165 U/L (ref 98–192)

## 2020-11-23 LAB — GLUCOSE, CAPILLARY
Glucose-Capillary: 147 mg/dL — ABNORMAL HIGH (ref 70–99)
Glucose-Capillary: 151 mg/dL — ABNORMAL HIGH (ref 70–99)
Glucose-Capillary: 159 mg/dL — ABNORMAL HIGH (ref 70–99)
Glucose-Capillary: 167 mg/dL — ABNORMAL HIGH (ref 70–99)
Glucose-Capillary: 169 mg/dL — ABNORMAL HIGH (ref 70–99)
Glucose-Capillary: 171 mg/dL — ABNORMAL HIGH (ref 70–99)
Glucose-Capillary: 183 mg/dL — ABNORMAL HIGH (ref 70–99)
Glucose-Capillary: 217 mg/dL — ABNORMAL HIGH (ref 70–99)

## 2020-11-23 LAB — BILIRUBIN, FRACTIONATED(TOT/DIR/INDIR)
Bilirubin, Direct: 0.3 mg/dL — ABNORMAL HIGH (ref 0.0–0.2)
Indirect Bilirubin: 0.7 mg/dL (ref 0.3–0.9)
Total Bilirubin: 1 mg/dL (ref 0.3–1.2)

## 2020-11-23 LAB — PREPARE RBC (CROSSMATCH)

## 2020-11-23 LAB — RETIC PANEL
Immature Retic Fract: 16 % — ABNORMAL HIGH (ref 2.3–15.9)
RBC.: 2.3 MIL/uL — ABNORMAL LOW (ref 3.87–5.11)
Retic Count, Absolute: 49.4 10*3/uL (ref 19.0–186.0)
Retic Ct Pct: 2.2 % (ref 0.4–3.1)
Reticulocyte Hemoglobin: 29.5 pg (ref >27.9)

## 2020-11-23 LAB — CBC WITH DIFFERENTIAL/PLATELET
Abs Immature Granulocytes: 0.02 10*3/uL (ref 0.00–0.07)
Basophils Absolute: 0 10*3/uL (ref 0.0–0.1)
Basophils Relative: 1 %
Eosinophils Absolute: 0.1 10*3/uL (ref 0.0–0.5)
Eosinophils Relative: 2 %
HCT: 19.9 % — ABNORMAL LOW (ref 36.0–46.0)
Hemoglobin: 6.7 g/dL — ABNORMAL LOW (ref 12.0–15.0)
Immature Granulocytes: 0 %
Lymphocytes Relative: 20 %
Lymphs Abs: 1 10*3/uL (ref 0.7–4.0)
MCH: 30.5 pg (ref 26.0–34.0)
MCHC: 33.7 g/dL (ref 30.0–36.0)
MCV: 90.5 fL (ref 80.0–100.0)
Monocytes Absolute: 0.3 10*3/uL (ref 0.1–1.0)
Monocytes Relative: 6 %
Neutro Abs: 3.8 10*3/uL (ref 1.7–7.7)
Neutrophils Relative %: 71 %
Platelets: 126 10*3/uL — ABNORMAL LOW (ref 150–400)
RBC: 2.2 MIL/uL — ABNORMAL LOW (ref 3.87–5.11)
RDW: 14.2 % (ref 11.5–15.5)
WBC: 5.3 10*3/uL (ref 4.0–10.5)
nRBC: 0 % (ref 0.0–0.2)

## 2020-11-23 LAB — PROCALCITONIN: Procalcitonin: 0.47 ng/mL

## 2020-11-23 LAB — URINE CULTURE

## 2020-11-23 LAB — COPPER, SERUM: Copper: 179 ug/dL — ABNORMAL HIGH (ref 80–158)

## 2020-11-23 LAB — ZINC: Zinc: 40 ug/dL — ABNORMAL LOW (ref 44–115)

## 2020-11-23 MED ORDER — HEPARIN SOD (PORK) LOCK FLUSH 100 UNIT/ML IV SOLN
250.0000 [IU] | INTRAVENOUS | Status: DC | PRN
Start: 2020-11-23 — End: 2020-11-24
  Filled 2020-11-23: qty 5

## 2020-11-23 MED ORDER — DIPHENHYDRAMINE HCL 25 MG PO CAPS
25.0000 mg | ORAL_CAPSULE | Freq: Once | ORAL | Status: AC
  Administered 2020-11-23: 25 mg via ORAL
  Filled 2020-11-23: qty 1

## 2020-11-23 MED ORDER — LACTATED RINGERS IV BOLUS
500.0000 mL | Freq: Once | INTRAVENOUS | Status: AC
  Administered 2020-11-23: 500 mL via INTRAVENOUS

## 2020-11-23 MED ORDER — CYANOCOBALAMIN 1000 MCG/ML IJ SOLN
1000.0000 ug | Freq: Every day | INTRAMUSCULAR | Status: DC
  Administered 2020-11-23 – 2020-11-24 (×2): 1000 ug via INTRAMUSCULAR
  Filled 2020-11-23: qty 1

## 2020-11-23 MED ORDER — SODIUM CHLORIDE 0.9% FLUSH
10.0000 mL | INTRAVENOUS | Status: DC | PRN
Start: 2020-11-23 — End: 2020-11-24

## 2020-11-23 MED ORDER — SODIUM CHLORIDE 0.9% IV SOLUTION
250.0000 mL | Freq: Once | INTRAVENOUS | Status: AC
  Administered 2020-11-23: 250 mL via INTRAVENOUS

## 2020-11-23 MED ORDER — HEPARIN SOD (PORK) LOCK FLUSH 100 UNIT/ML IV SOLN
500.0000 [IU] | Freq: Every day | INTRAVENOUS | Status: DC | PRN
  Filled 2020-11-23: qty 5

## 2020-11-23 MED ORDER — ZINC SULFATE 220 (50 ZN) MG PO CAPS
220.0000 mg | ORAL_CAPSULE | Freq: Every day | ORAL | Status: DC
  Filled 2020-11-23 (×2): qty 1

## 2020-11-23 MED ORDER — SODIUM CHLORIDE 0.9% FLUSH
3.0000 mL | INTRAVENOUS | Status: DC | PRN
Start: 2020-11-23 — End: 2020-11-24

## 2020-11-23 MED ORDER — FUROSEMIDE 10 MG/ML IJ SOLN
20.0000 mg | Freq: Every day | INTRAMUSCULAR | Status: DC
  Administered 2020-11-23 – 2020-11-24 (×2): 20 mg via INTRAVENOUS
  Filled 2020-11-23 (×2): qty 2

## 2020-11-23 MED ORDER — LACTATED RINGERS IV SOLN: INTRAVENOUS | Status: DC

## 2020-11-23 MED ORDER — FLUCONAZOLE 50 MG PO TABS
150.0000 mg | ORAL_TABLET | Freq: Once | ORAL | Status: AC
  Administered 2020-11-23: 150 mg via ORAL
  Filled 2020-11-23: qty 1

## 2020-11-23 MED ORDER — ACETAMINOPHEN 325 MG PO TABS
650.0000 mg | ORAL_TABLET | Freq: Once | ORAL | Status: AC
  Administered 2020-11-23: 650 mg via ORAL
  Filled 2020-11-23: qty 2

## 2020-11-23 NOTE — Progress Notes (Signed)
RN spoke with Hospitalist Dr. Allena Katz. Verbal orders given to D/C sputum culture, Change rate of D5 and LR both to 50 ml/hr. Dr. Allena Katz wants patient to try juice and liquids this morning so RN gave patient apple Juice and water to see if they stay down without vomiting. Pt states she is having difficulty urinating and has some significant labial swelling and white discharge. Previous shift RN states patient only had 250 output from 7p-7a. RN gave patient a complete bed bath and changed all bed linen then assisted patient to bedside toilet. Pt unable to void at this time and last void was at 0330. Will notify Dr. [name] and continue to monitor.

## 2020-11-23 NOTE — Progress Notes (Signed)
Pt up to chair. Pt tolerated getting out of bed for 1.5 hours and is using Incentive Spirometer every 30 minutes to 1 hour and currently reaching 500. Pt has been sipping fluids today and only had one episode of emesis since 0700 with this RN that only totaled 20 ml. Pt is constantly nauseous despite all of her medications and is spitting more than producing emesis. Pt has attempted to void since her I&O at 1030 this Am but has been unsuccessful so Dr. [name] gave verbal order for indwelling catheter.

## 2020-11-23 NOTE — Progress Notes (Signed)
Patient ID: Ashley Camacho, female   DOB: Jul 27, 1990, 30 y.o.   MRN: [id number]    Subjective:    She feels somewhat better today.  Reports that she is still "gagging".  Breathing much better and coughing decreased.  She does state that this morning she has been unable to void but does not feel the need at this time.  She does complain of vulvar swelling and rash.  Objective:    Patient Vitals for the past 2 hrs:  Pulse SpO2  11/23/20 0941 100 96 %  11/23/20 0940 (!) 101 96 %  11/23/20 0939 100 97 %  11/23/20 0938 (!) 101 98 %  11/23/20 0937 100 94 %  11/23/20 0936 100 93 %  11/23/20 0935 (!) 101 93 %  11/23/20 0934 100 92 %  11/23/20 0933 100 95 %  11/23/20 0932 98 95 %  11/23/20 0931 99 95 %  11/23/20 0930 99 95 %  11/23/20 0929 99 96 %  11/23/20 0928 100 96 %  11/23/20 0927 99 96 %  11/23/20 0926 99 96 %  11/23/20 0925 100 97 %  11/23/20 0924 100 96 %  11/23/20 0923 (!) 101 96 %  11/23/20 0922 (!) 101 95 %  11/23/20 0921 (!) 102 96 %  11/23/20 0920 (!) 103 97 %  11/23/20 0919 (!) 102 96 %  11/23/20 0918 100 100 %  11/23/20 0917 (!) 101 100 %  11/23/20 0916 (!) 101 100 %  11/23/20 0915 (!) 101 99 %  11/23/20 0914 (!) 101 99 %  11/23/20 0913 (!) 102 99 %  11/23/20 0912 (!) 103 100 %  11/23/20 0911 100 100 %  11/23/20 0910 (!) 101 100 %  11/23/20 0909 (!) 101 100 %  11/23/20 0908 (!) 102 99 %  11/23/20 0907 (!) 102 99 %  11/23/20 0906 (!) 101 97 %  11/23/20 0905 (!) 101 98 %  11/23/20 0904 95 99 %  11/23/20 0903 99 99 %  11/23/20 0902 97 99 %  11/23/20 0901 97 99 %  11/23/20 0900 98 98 %  11/23/20 0859 100 99 %  11/23/20 0858 98 99 %  11/23/20 0857 97 99 %  11/23/20 0856 97 99 %  11/23/20 0855 98 98 %  11/23/20 0854 99 99 %  11/23/20 0853 99 100 %  11/23/20 0852 98 99 %  11/23/20 0851 98 99 %  11/23/20 0850 98 99 %  11/23/20 0849 98 98 %  11/23/20 0848 98 99 %  11/23/20 0847 98 99 %  11/23/20 0846 99 98 %  11/23/20 0845 99 98 %  11/23/20 0844 99 98  %  11/23/20 0843 98 99 %  11/23/20 0842 98 99 %  11/23/20 0841 98 99 %  11/23/20 0840 98 99 %  11/23/20 0839 97 99 %  11/23/20 0838 97 99 %  11/23/20 0837 99 99 %  11/23/20 0836 (!) 101 100 %  11/23/20 0835 97 99 %  11/23/20 0834 97 98 %  11/23/20 0833 98 99 %  11/23/20 0832 99 98 %  11/23/20 0831 98 99 %  11/23/20 0830 97 99 %  11/23/20 0818 100 100 %  11/23/20 0817 100 100 %  11/23/20 0816 100 100 %  11/23/20 0815 (!) 102 100 %  11/23/20 0814 98 100 %  11/23/20 0813 99 100 %  11/23/20 0812 99 100 %  11/23/20 0811 100 100 %  11/23/20 0810 100 100 %  11/23/20 0809 (!)  101 100 %  11/23/20 0808 (!) 101 100 %  11/23/20 0807 (!) 106 99 %  11/23/20 0806 98 (!) 87 %  11/23/20 0805 100 98 %  11/23/20 0804 100 97 %  11/23/20 0803 100 97 %  11/23/20 0802 (!) 102 94 %  11/23/20 0801 (!) 104 92 %  11/23/20 0800 (!) 101 97 %   Total I/O In: 1172.1 [P.O.:120; I.V.:669.4; IV Piggyback:382.7] Out: -   Labs: Results for orders placed or performed during the hospital encounter of 11/19/20 (from the past 24 hour(s))  Glucose, capillary     Status: Abnormal   Collection Time: 11/22/20 10:09 AM  Result Value Ref Range   Glucose-Capillary 226 (H) 70 - 99 mg/dL  Glucose, capillary     Status: Abnormal   Collection Time: 11/22/20 12:16 PM  Result Value Ref Range   Glucose-Capillary 250 (H) 70 - 99 mg/dL  Glucose, capillary     Status: Abnormal   Collection Time: 11/22/20  1:12 PM  Result Value Ref Range   Glucose-Capillary 260 (H) 70 - 99 mg/dL  Strep pneumoniae urinary antigen     Status: None   Collection Time: 11/22/20  1:17 PM  Result Value Ref Range   Strep Pneumo Urinary Antigen NEGATIVE NEGATIVE  Glucose, capillary     Status: Abnormal   Collection Time: 11/22/20  3:19 PM  Result Value Ref Range   Glucose-Capillary 209 (H) 70 - 99 mg/dL  Glucose, capillary     Status: Abnormal   Collection Time: 11/22/20  5:13 PM  Result Value Ref Range   Glucose-Capillary 182 (H) 70 -  99 mg/dL  Glucose, capillary     Status: Abnormal   Collection Time: 11/22/20  7:43 PM  Result Value Ref Range   Glucose-Capillary 178 (H) 70 - 99 mg/dL  Glucose, capillary     Status: Abnormal   Collection Time: 11/22/20 10:08 PM  Result Value Ref Range   Glucose-Capillary 175 (H) 70 - 99 mg/dL  Glucose, capillary     Status: Abnormal   Collection Time: 11/23/20 12:04 AM  Result Value Ref Range   Glucose-Capillary 171 (H) 70 - 99 mg/dL  Glucose, capillary     Status: Abnormal   Collection Time: 11/23/20  2:17 AM  Result Value Ref Range   Glucose-Capillary 169 (H) 70 - 99 mg/dL  Glucose, capillary     Status: Abnormal   Collection Time: 11/23/20  4:20 AM  Result Value Ref Range   Glucose-Capillary 151 (H) 70 - 99 mg/dL  Glucose, capillary     Status: Abnormal   Collection Time: 11/23/20  6:16 AM  Result Value Ref Range   Glucose-Capillary 147 (H) 70 - 99 mg/dL  Procalcitonin     Status: None   Collection Time: 11/23/20  6:19 AM  Result Value Ref Range   Procalcitonin 0.47 ng/mL  Glucose, capillary     Status: Abnormal   Collection Time: 11/23/20  8:17 AM  Result Value Ref Range   Glucose-Capillary 159 (H) 70 - 99 mg/dL    Medications    Current Discharge Medication List     CONTINUE these medications which have NOT CHANGED   Details  aspirin EC 81 MG tablet Take 81 mg by mouth daily. Swallow whole.    Doxylamine-Pyridoxine (DICLEGIS PO) Take by mouth as needed (for nausea and vomiting).    famotidine (PEPCID) 20 MG tablet Take 20 mg by mouth 2 (two) times daily.    ondansetron (ZOFRAN ODT) 4  MG disintegrating tablet Take 1 tablet (4 mg total) by mouth every 8 (eight) hours as needed for nausea or vomiting. Qty: 20 tablet, Refills: 1    Prenatal Vit-Fe Fumarate-FA (PRENATAL MULTIVITAMIN) TABS tablet Take 1 tablet by mouth daily at 12 noon.    prochlorperazine (COMPAZINE) 10 MG tablet Take 10 mg by mouth every 6 (six) hours as needed for nausea or vomiting.     ferrous sulfate 324 MG TBEC Take 324 mg by mouth.    insulin aspart (NOVOLOG) 100 UNIT/ML injection Inject 0-12 Units into the skin 3 (three) times daily before meals. Qty: 10 mL, Refills: 11   Comments: Novolog 1 unit for every 15 grams of CHO  -Novolog correction- 181-220 mg/dL- 1 unit, 294-765 mg/dL-2 units, 465-035 mg/dL- 3 units, 465-681 mg/dL-4 units, 275-170 mg/dL- 5 units, 017-494 mg/dL- 6 units          Assessment/PLAN  Pneumonia -improving on antibiotics. --Continue antibiotics.  Discontinue nasal cannula oxygen.  Vulvar swelling and itching-likely monilia.  -Diflucan given.  Expect patient to be out of bed and sitting in a chair today.  Continue antiemetics  Fluid bolus given and will expect patient to void within 1 hour of bolus..  If she remains unable consider indwelling catheter.  Continue to monitor sugars and adjust insulin and D5 LR accordingly.   Today I spent greater than 30 minutes in the direct care of this patient.  Elonda Husky, M.D. 11/23/2020 9:58 AM

## 2020-11-23 NOTE — Progress Notes (Signed)
Verbal Order given from Dr. [name] to D/C oxygen, and give 500cc bolus of LR. If pateitn hasn't voided in one hour following bolus then RN will I&O Cath patient. Pt informed of plan of care and has no questions at this time.

## 2020-11-23 NOTE — Progress Notes (Addendum)
[facility]  - [place] at [facility]   PATIENT NAME: Ashley Camacho    MR#:  [id number]  DATE OF BIRTH:  [birth year]  SUBJECTIVE:   More awake. Tells me she did not sleep last nite. No vomiting. Nausea+ Trying broth Not much po intake Afebrile for 24 hours. Not much cough REVIEW OF SYSTEMS:   Review of Systems  Constitutional:  Negative for chills, fever and weight loss.  HENT:  Negative for ear discharge, ear pain and nosebleeds.   Eyes:  Negative for blurred vision, pain and discharge.  Respiratory:  Positive for cough. Negative for sputum production, shortness of breath, wheezing and stridor.   Cardiovascular:  Negative for chest pain, palpitations, orthopnea and PND.  Gastrointestinal:  Positive for nausea and vomiting. Negative for abdominal pain and diarrhea.  Genitourinary:  Negative for frequency and urgency.  Musculoskeletal:  Negative for back pain and joint pain.  Neurological:  Positive for weakness. Negative for sensory change, speech change and focal weakness.  Psychiatric/Behavioral:  Negative for depression and hallucinations. The patient is not nervous/anxious.   Tolerating Diet:no Tolerating PT:   DRUG ALLERGIES:  No Known Allergies  VITALS:  Blood pressure 129/79, pulse 97, temperature 97.8 F (36.6 C), temperature source Oral, resp. rate 16, height 5\' 1"  (1.549 m), weight 57 kg, SpO2 98 %.  PHYSICAL EXAMINATION:   Physical Exam  GENERAL:  30 y.o.-year-old patient lying in the bed with no acute distress.  LUNGS: Normal breath sounds bilaterally, no wheezing, rales, rhonchi. No use of accessory muscles of respiration.  CARDIOVASCULAR: S1, S2 normal. No murmurs, rubs, or gallops. tachycardia ABDOMEN: Soft, nontender, nondistended. Bowel sounds present. No organomegaly or mass.  EXTREMITIES: No cyanosis, clubbing or edema b/l.    NEUROLOGIC: nonfocal PSYCHIATRIC:  patient is alert and oriented x 3.  SKIN: No obvious rash, lesion, or ulcer.    LABORATORY PANEL:  CBC Recent Labs  Lab 11/23/20 1003  WBC 5.3  HGB 6.7*  HCT 19.9*  PLT 126*     Chemistries  Recent Labs  Lab 11/21/20 0649  NA 136  K 4.1  CL 108  CO2 22  GLUCOSE 121*  BUN 9  CREATININE 1.01*  CALCIUM 8.1*  AST 35  ALT 16  ALKPHOS 39  BILITOT 0.6    Cardiac Enzymes No results for input(s): TROPONINI in the last 168 hours. RADIOLOGY:  CT Angio Chest Pulmonary Embolism (PE) W or WO Contrast  Result Date: 11/22/2020 CLINICAL DATA:  Pulmonary embolus suspected with low to intermediate probability. Positive D-dimer. Shortness of breath and chest pain. EXAM: CT ANGIOGRAPHY CHEST WITH CONTRAST TECHNIQUE: Multidetector CT imaging of the chest was performed using the standard protocol during bolus administration of intravenous contrast. Multiplanar CT image reconstructions and MIPs were obtained to evaluate the vascular anatomy. CONTRAST:  26mL OMNIPAQUE IOHEXOL 350 MG/ML SOLN COMPARISON:  05/14/2016 FINDINGS: Cardiovascular: Good opacification of the central and segmental pulmonary arteries. No focal filling defects. No evidence of significant pulmonary embolus. Normal heart size. No pericardial effusions. Normal caliber thoracic aorta. No dissection. Mediastinum/Nodes: No significant lymphadenopathy in the chest. Esophagus is decompressed. Thyroid gland is unremarkable. Lungs/Pleura: Motion artifact limits examination. There are moderate bilateral pleural effusions with basilar atelectasis or consolidation. Perihilar infiltrates and interstitial changes in the lungs likely to represent edema. Focal nodular infiltrates may represent multifocal pneumonia. Upper Abdomen: No acute abnormalities demonstrated in the visualized upper abdomen. Musculoskeletal: No chest wall abnormality. No acute or significant osseous findings. Review of the MIP images  confirms the above findings. IMPRESSION: 1. No evidence of significant pulmonary embolus. 2. Moderate bilateral pleural  effusions with bilateral basilar atelectasis or consolidation. 3. Probable perihilar edema. 4. Additional nodular infiltrative foci throughout the lungs may represent superimposed multifocal pneumonia. Electronically Signed   By: Burman Nieves M.D.   On: 11/22/2020 01:10   US Venous Img Lower Bilateral (DVT)  Result Date: 11/22/2020 CLINICAL DATA:  Twenty-one week gravid female with bilateral lower extremity edema EXAM: BILATERAL LOWER EXTREMITY VENOUS DOPPLER ULTRASOUND TECHNIQUE: Gray-scale sonography with graded compression, as well as color Doppler and duplex ultrasound were performed to evaluate the lower extremity deep venous systems from the level of the common femoral vein and including the common femoral, femoral, profunda femoral, popliteal and calf veins including the posterior tibial, peroneal and gastrocnemius veins when visible. The superficial great saphenous vein was also interrogated. Spectral Doppler was utilized to evaluate flow at rest and with distal augmentation maneuvers in the common femoral, femoral and popliteal veins. COMPARISON:  None. FINDINGS: RIGHT LOWER EXTREMITY Common Femoral Vein: No evidence of thrombus. Normal compressibility, respiratory phasicity and response to augmentation. Saphenofemoral Junction: No evidence of thrombus. Normal compressibility and flow on color Doppler imaging. Profunda Femoral Vein: No evidence of thrombus. Normal compressibility and flow on color Doppler imaging. Femoral Vein: No evidence of thrombus. Normal compressibility, respiratory phasicity and response to augmentation. Popliteal Vein: No evidence of thrombus. Normal compressibility, respiratory phasicity and response to augmentation. Calf Veins: No evidence of thrombus. Normal compressibility and flow on color Doppler imaging. Superficial Great Saphenous Vein: No evidence of thrombus. Normal compressibility. Venous Reflux:  None. Other Findings:  None. LEFT LOWER EXTREMITY Common Femoral  Vein: No evidence of thrombus. Normal compressibility, respiratory phasicity and response to augmentation. Saphenofemoral Junction: No evidence of thrombus. Normal compressibility and flow on color Doppler imaging. Profunda Femoral Vein: No evidence of thrombus. Normal compressibility and flow on color Doppler imaging. Femoral Vein: No evidence of thrombus. Normal compressibility, respiratory phasicity and response to augmentation. Popliteal Vein: No evidence of thrombus. Normal compressibility, respiratory phasicity and response to augmentation. Calf Veins: No evidence of thrombus. Normal compressibility and flow on color Doppler imaging. Superficial Great Saphenous Vein: No evidence of thrombus. Normal compressibility. Venous Reflux:  None. Other Findings:  None. IMPRESSION: No evidence of deep venous thrombosis in either lower extremity. Electronically Signed   By: Malachy Moan M.D.   On: 11/22/2020 08:10   ASSESSMENT AND PLAN:  Ashley Camacho is a 30 y.o. female with medical history significant for insulin-dependent diabetes mellitus on insulin pump, G2 P1, 20 weeks 3 days, gastroparesis,admitted to labor and delivery observation unit for chief concerns of persistent nausea and vomiting.  IM consulted for fever and leukocytosis.  # Intractable N/V in the setting of pregnancy, ?Gastroparesis/pneumonia --IVF --prn antiemtics --CLD  #  sepsis  due to Multifocal pneumonia --presented  with elevated heart rate, leukocytosis, fever --Unasyn +zithromax --BC negative so far --UC --multiple species - Agree with lactic acid x2, blood cultures x2, UA and urine culture, troponins x2 - COVID PCR was negative on 11/20/2020 and 11/21/2020 - Recommend to maintain MAP greater than 65 - wbc 2.7--4.9--5.3 --PCT- 0.30 -- lactic acid 0.26 --pt not able to produce sputum  # Severe acute on chronic anemia in setting of iron deficiency and dilutional anemia in setting of pregnancy - Status post 1 unit packed  red blood cell - Appreciate Dr Bethanne Ginger  recommendation --hgb 6.1--1 unit PRBC--7.5--7.6--6.7--1 unit ordered   # Thrombocytopenia-plt remains  stable  # Dm-1 on Insulin pump --sugars trending up --d/c dextrose gtt --pt to take insulin according to sugars   CODE STATUS: full DVT Prophylaxis :enoxaparin Level of care: Antepartum Status is: Inpatient  Remains inpatient appropriate because:Inpatient level of care appropriate due to severity of illness  Dispo: The patient is from: Home              Anticipated d/c is to: Home              Patient currently is not medically stable to d/c.   Difficult to place patient No        TOTAL TIME TAKING CARE OF THIS PATIENT: 25 minutes.  >50% time spent on counselling and coordination of care  Note: This dictation was prepared with Dragon dictation along with smaller phrase technology. Any transcriptional errors that result from this process are unintentional.  Enedina Finner M.D    [facility]   CC: Primary care physician; Hildred Laser, MD Patient ID: Ashley Camacho, female   DOB: 1991/02/05, 30 y.o.   MRN: [id number]

## 2020-11-23 NOTE — Progress Notes (Signed)
[name] notified of patient out put of 11 ml at 1600 and 13 ml at 1700 after having a 500 cc bolus of LR. Provider also notified of third spacing noticed on assessment. Provider to consult with hospitalist and update on new orders shortly.

## 2020-11-23 NOTE — Progress Notes (Signed)
Transfusing 1 unit of blood per order.

## 2020-11-23 NOTE — Progress Notes (Signed)
Spoke with Dr. Allena Katz via phone. Orders given to D/C LR and keep D5 in LR running at 75 ml/hr for maintenance. Order given for Lasix 20mg  once and RN will continue to monitor I&O.

## 2020-11-24 ENCOUNTER — Inpatient Hospital Stay: Payer: Medicaid Other

## 2020-11-24 DIAGNOSIS — N179 Acute kidney failure, unspecified: Secondary | ICD-10-CM

## 2020-11-24 DIAGNOSIS — D508 Other iron deficiency anemias: Secondary | ICD-10-CM

## 2020-11-24 DIAGNOSIS — J189 Pneumonia, unspecified organism: Secondary | ICD-10-CM

## 2020-11-24 DIAGNOSIS — O24912 Unspecified diabetes mellitus in pregnancy, second trimester: Secondary | ICD-10-CM

## 2020-11-24 DIAGNOSIS — R34 Anuria and oliguria: Secondary | ICD-10-CM

## 2020-11-24 DIAGNOSIS — R0602 Shortness of breath: Secondary | ICD-10-CM

## 2020-11-24 DIAGNOSIS — E60 Dietary zinc deficiency: Secondary | ICD-10-CM

## 2020-11-24 DIAGNOSIS — E538 Deficiency of other specified B group vitamins: Secondary | ICD-10-CM

## 2020-11-24 DIAGNOSIS — R609 Edema, unspecified: Secondary | ICD-10-CM

## 2020-11-24 LAB — CBC WITH DIFFERENTIAL/PLATELET
Abs Immature Granulocytes: 0.01 10*3/uL (ref 0.00–0.07)
Basophils Absolute: 0.1 10*3/uL (ref 0.0–0.1)
Basophils Relative: 1 %
Eosinophils Absolute: 0.2 10*3/uL (ref 0.0–0.5)
Eosinophils Relative: 4 %
HCT: 24.3 % — ABNORMAL LOW (ref 36.0–46.0)
Hemoglobin: 8.4 g/dL — ABNORMAL LOW (ref 12.0–15.0)
Immature Granulocytes: 0 %
Lymphocytes Relative: 20 %
Lymphs Abs: 1.2 10*3/uL (ref 0.7–4.0)
MCH: 30.9 pg (ref 26.0–34.0)
MCHC: 34.6 g/dL (ref 30.0–36.0)
MCV: 89.3 fL (ref 80.0–100.0)
Monocytes Absolute: 0.4 10*3/uL (ref 0.1–1.0)
Monocytes Relative: 6 %
Neutro Abs: 4.1 10*3/uL (ref 1.7–7.7)
Neutrophils Relative %: 69 %
Platelets: 146 10*3/uL — ABNORMAL LOW (ref 150–400)
RBC: 2.72 MIL/uL — ABNORMAL LOW (ref 3.87–5.11)
RDW: 14.3 % (ref 11.5–15.5)
WBC: 5.9 10*3/uL (ref 4.0–10.5)
nRBC: 0 % (ref 0.0–0.2)

## 2020-11-24 LAB — TYPE AND SCREEN
ABO/RH(D): A POS
Antibody Screen: NEGATIVE
Unit division: 0
Unit division: 0

## 2020-11-24 LAB — BASIC METABOLIC PANEL
Anion gap: 7 (ref 5–15)
BUN: 11 mg/dL (ref 6–20)
CO2: 20 mmol/L — ABNORMAL LOW (ref 22–32)
Calcium: 7.3 mg/dL — ABNORMAL LOW (ref 8.9–10.3)
Chloride: 110 mmol/L (ref 98–111)
Creatinine, Ser: 1.54 mg/dL — ABNORMAL HIGH (ref 0.44–1.00)
GFR, Estimated: 47 mL/min — ABNORMAL LOW (ref >60)
Glucose, Bld: 166 mg/dL — ABNORMAL HIGH (ref 70–99)
Potassium: 3.3 mmol/L — ABNORMAL LOW (ref 3.5–5.1)
Sodium: 137 mmol/L (ref 135–145)

## 2020-11-24 LAB — URINALYSIS, ROUTINE W REFLEX MICROSCOPIC
Bacteria, UA: NONE SEEN
Bilirubin Urine: NEGATIVE
Glucose, UA: NEGATIVE mg/dL
Ketones, ur: 5 mg/dL — AB
Nitrite: NEGATIVE
Protein, ur: 100 mg/dL — AB
RBC / HPF: >50 RBC/hpf — ABNORMAL HIGH (ref 0–5)
Specific Gravity, Urine: 1.027 (ref 1.005–1.030)
pH: 5 (ref 5.0–8.0)

## 2020-11-24 LAB — GLUCOSE, CAPILLARY
Glucose-Capillary: 148 mg/dL — ABNORMAL HIGH (ref 70–99)
Glucose-Capillary: 149 mg/dL — ABNORMAL HIGH (ref 70–99)
Glucose-Capillary: 155 mg/dL — ABNORMAL HIGH (ref 70–99)
Glucose-Capillary: 155 mg/dL — ABNORMAL HIGH (ref 70–99)
Glucose-Capillary: 193 mg/dL — ABNORMAL HIGH (ref 70–99)

## 2020-11-24 LAB — BPAM RBC
Blood Product Expiration Date: 202208012359
Blood Product Expiration Date: 202208022359
ISSUE DATE / TIME: 202206301427
ISSUE DATE / TIME: 202207031317
Unit Type and Rh: 6200
Unit Type and Rh: 6200

## 2020-11-24 LAB — LEGIONELLA PNEUMOPHILA SEROGP 1 UR AG: L. pneumophila Serogp 1 Ur Ag: NEGATIVE

## 2020-11-24 LAB — BRAIN NATRIURETIC PEPTIDE: B Natriuretic Peptide: 1108.1 pg/mL — ABNORMAL HIGH (ref 0.0–100.0)

## 2020-11-24 MED ORDER — FUROSEMIDE 10 MG/ML IJ SOLN: 20.0000 mg | Freq: Once | INTRAMUSCULAR | Status: DC

## 2020-11-24 MED ORDER — FUROSEMIDE 10 MG/ML IJ SOLN
20.0000 mg | Freq: Once | INTRAMUSCULAR | Status: AC
  Administered 2020-11-24: 20 mg via INTRAVENOUS
  Filled 2020-11-24: qty 2

## 2020-11-24 MED ORDER — FUROSEMIDE 10 MG/ML IJ SOLN
40.0000 mg | Freq: Two times a day (BID) | INTRAMUSCULAR | Status: DC
  Filled 2020-11-24: qty 4

## 2020-11-24 NOTE — Progress Notes (Signed)
[facility]  - [place] at [facility]   PATIENT NAME: Ashley Camacho    MR#:  [id number]  DATE OF BIRTH:  03-28-1991  SUBJECTIVE:   More awake.Not much po intake due to gagging Gets sob and desats in the 70's. Currently on oxygen Afebrile for 24 hours.  UOp improved after lasix x1 yday REVIEW OF SYSTEMS:   Review of Systems  Constitutional:  Negative for chills, fever and weight loss.  HENT:  Negative for ear discharge, ear pain and nosebleeds.   Eyes:  Negative for blurred vision, pain and discharge.  Respiratory:  Positive for cough. Negative for sputum production, shortness of breath, wheezing and stridor.   Cardiovascular:  Negative for chest pain, palpitations, orthopnea and PND.  Gastrointestinal:  Positive for nausea and vomiting. Negative for abdominal pain and diarrhea.  Genitourinary:  Negative for frequency and urgency.  Musculoskeletal:  Negative for back pain and joint pain.  Neurological:  Positive for weakness. Negative for sensory change, speech change and focal weakness.  Psychiatric/Behavioral:  Negative for depression and hallucinations. The patient is not nervous/anxious.   Tolerating Diet:no Tolerating PT:   DRUG ALLERGIES:  No Known Allergies  VITALS:  Blood pressure (!) 151/85, pulse 97, temperature 97.8 F (36.6 C), temperature source Oral, resp. rate 18, height 5\' 1"  (1.549 m), weight 57 kg, SpO2 95 %.  PHYSICAL EXAMINATION:   Physical Exam  GENERAL:  30 y.o.-year-old patient lying in the bed with no acute distress.pallor+  LUNGS: decreased breath sounds bilaterally, no wheezing, rales, rhonchi. No use of accessory muscles of respiration. Breathing comfortably CARDIOVASCULAR: S1, S2 normal. No murmurs, rubs, or gallops. tachycardia ABDOMEN: Soft, nontender, nondistended. Bowel sounds present. No organomegaly or mass. Labial edema+ EXTREMITIES: No cyanosis, clubbing, +edema b/l.    NEUROLOGIC: nonfocal PSYCHIATRIC:  patient is alert  and oriented x 3.  SKIN: No obvious rash, lesion, or ulcer.   LABORATORY PANEL:  CBC Recent Labs  Lab 11/24/20 1026  WBC 5.9  HGB 8.4*  HCT 24.3*  PLT 146*     Chemistries  Recent Labs  Lab 11/21/20 0649 11/23/20 1130 11/24/20 1119  NA 136  --  137  K 4.1  --  3.3*  CL 108  --  110  CO2 22  --  20*  GLUCOSE 121*  --  166*  BUN 9  --  11  CREATININE 1.01*  --  1.54*  CALCIUM 8.1*  --  7.3*  AST 35  --   --   ALT 16  --   --   ALKPHOS 39  --   --   BILITOT 0.6 1.0  --     Cardiac Enzymes No results for input(s): TROPONINI in the last 168 hours. RADIOLOGY:  DG Chest Port 1 View  Result Date: 11/24/2020 CLINICAL DATA:  Pneumonia.  Pregnant EXAM: PORTABLE CHEST 1 VIEW COMPARISON:  CT 11/22/2020 FINDINGS: Normal cardiac silhouette. bilateral moderate pleural effusions. There is central venous congestion and mild pulmonary edema. No pneumothorax. IMPRESSION: 1. Bilateral pleural effusions similar comparison CT. 2. Potential increase in central pulmonary edema pattern Electronically Signed   By: 01/23/2021 M.D.   On: 11/24/2020 11:54   ASSESSMENT AND PLAN:  Ashley Camacho is a 30 y.o. female with medical history significant for insulin-dependent diabetes mellitus on insulin pump, G2 P1, 20 weeks 3 days, gastroparesis,admitted to labor and delivery observation unit for chief concerns of persistent nausea and vomiting.  IM consulted for fever and leukocytosis.  #  Intractable N/V in the setting of pregnancy, ?Gastroparesis/pneumonia --IVF --prn antiemtics --CLD--pt not eating much -?NG feeding  #  sepsis  due to Multifocal pneumonia #Acute hypoxic respiratory failure with CXR (7/4) Pulmonary edema/?CHF with pleural effusions with severe anemia --presented  with elevated heart rate, leukocytosis, fever --Unasyn +zithromax --BC negative so far --UC --multiple species - Agree with lactic acid x2, blood cultures x2, UA and urine culture, troponins x2 - COVID PCR was  negative on 11/20/2020 and 11/21/2020 - Recommend to maintain MAP greater than 65 - wbc 2.7--4.9--5.3 --PCT- 0.30 -- lactic acid 0.26 --pt not able to produce sputum --started on IV lasix. Check BNP and echo  # Severe acute on chronic anemia in setting of iron deficiency and dilutional anemia in setting of pregnancy - Status post 1 unit packed red blood cell - Appreciate Dr Bethanne Ginger  recommendation --hgb 6.1--1 unit PRBC--7.5--7.6--6.7--1 unit ordered--8.4   # Thrombocytopenia-plt remains stable  # Dm-1 on Insulin pump --sugars trending up --d/c dextrose gtt --pt to take insulin according to sugars  Pt is quiet complex and has multiple co-morbidities. D/w Dr [name] at length and plan is to transfer her to [facility]   CODE STATUS: full DVT Prophylaxis :enoxaparin Level of care: Antepartum Status is: Inpatient        TOTAL TIME TAKING CARE OF THIS PATIENT: .  >50% time spent on counselling and coordination of care  Note: This dictation was prepared with Dragon dictation along with smaller phrase technology. Any transcriptional errors that result from this process are unintentional.  Enedina Finner M.D    [facility]   CC: Primary care physician; Hildred Laser, MD Patient ID: Ashley Camacho, female   DOB: 1990/08/24, 30 y.o.   MRN: [id number]

## 2020-11-24 NOTE — Discharge Summary (Addendum)
Physician Discharge Summary  Patient ID: Ashley Camacho MRN: [id number] DOB/AGE: Apr 10, 1991 30 y.o.  Admit date: 11/19/2020 Discharge date: 11/24/2020  Admission Diagnoses:  Discharge Diagnoses:  Principal Problem:   Nausea and vomiting during pregnancy Active Problems:   Indication for care in labor and delivery, antepartum   Anemia affecting pregnancy in second trimester   Absolute anemia   AKI (acute kidney injury) ([place])   Social problem   Nausea and vomiting during pregnancy prior to [redacted] weeks gestation   Low serum vitamin B12   Pneumonia affecting pregnancy   Diabetes mellitus affecting pregnancy, second trimester   Edema   Decreased urine output   Discharged Condition: poor  Hospital Course:    Multifocal pneumonia-currently being treated with antibiotics. (?  Aspiration ? -Because of her prolonged and constant emesis)            2.  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  Rec bone marrow biopsy.              3.  Nausea and vomiting -patient on multiple antiemetics.  Vomiting improved but Pt not taking PO.              4.  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    5.  Third space fluids with decreased urine output  6.  Elevated BNP - heart failure? - Peripartum cardiomyopathy although not a month of term? 7.  Normal FHT's for 22 wks. 8.  IV access issues - ? PICC line    Consults: hematology/oncology and Hospitalist Service and MFM - [facility])    Blood pressure 140/85, pulse 96, temperature 98.1 F (36.7 C), temperature source Oral, resp. rate 20, height 5' 1"  (1.549 m), weight 57 kg, SpO2 95 %.  Results for orders placed or performed during the hospital encounter of 11/19/20 (from the past 24 hour(s))  Glucose, capillary     Status: Abnormal   Collection Time: 11/23/20  3:57 PM  Result Value Ref Range   Glucose-Capillary 217 (H) 70 - 99 mg/dL  Glucose, capillary     Status:  Abnormal   Collection Time: 11/23/20  7:38 PM  Result Value Ref Range   Glucose-Capillary 183 (H) 70 - 99 mg/dL  Glucose, capillary     Status: Abnormal   Collection Time: 11/24/20 12:21 AM  Result Value Ref Range   Glucose-Capillary 193 (H) 70 - 99 mg/dL  Glucose, capillary     Status: Abnormal   Collection Time: 11/24/20  3:40 AM  Result Value Ref Range   Glucose-Capillary 149 (H) 70 - 99 mg/dL  Glucose, capillary     Status: Abnormal   Collection Time: 11/24/20  8:00 AM  Result Value Ref Range   Glucose-Capillary 148 (H) 70 - 99 mg/dL  Urinalysis, Routine w reflex microscopic Urine, Catheterized     Status: Abnormal   Collection Time: 11/24/20  8:54 AM  Result Value Ref Range   Color, Urine AMBER (A) YELLOW   APPearance HAZY (A) CLEAR   Specific Gravity, Urine 1.027 1.005 - 1.030   pH 5.0 5.0 - 8.0   Glucose, UA NEGATIVE NEGATIVE mg/dL   Hgb urine dipstick MODERATE (A) NEGATIVE   Bilirubin Urine NEGATIVE NEGATIVE   Ketones, ur 5 (A) NEGATIVE mg/dL   Protein, ur 100 (A) NEGATIVE mg/dL   Nitrite NEGATIVE NEGATIVE   Leukocytes,Ua SMALL (A) NEGATIVE   RBC / HPF >50 (H) 0 - 5 RBC/hpf   WBC, UA  21-50 0 - 5 WBC/hpf   Bacteria, UA NONE SEEN NONE SEEN   Squamous Epithelial / LPF 0-5 0 - 5   Mucus PRESENT   CBC with Differential     Status: Abnormal   Collection Time: 11/24/20 10:26 AM  Result Value Ref Range   WBC 5.9 4.0 - 10.5 K/uL   RBC 2.72 (L) 3.87 - 5.11 MIL/uL   Hemoglobin 8.4 (L) 12.0 - 15.0 g/dL   HCT 24.3 (L) 36.0 - 46.0 %   MCV 89.3 80.0 - 100.0 fL   MCH 30.9 26.0 - 34.0 pg   MCHC 34.6 30.0 - 36.0 g/dL   RDW 14.3 11.5 - 15.5 %   Platelets 146 (L) 150 - 400 K/uL   nRBC 0.0 0.0 - 0.2 %   Neutrophils Relative % 69 %   Neutro Abs 4.1 1.7 - 7.7 K/uL   Lymphocytes Relative 20 %   Lymphs Abs 1.2 0.7 - 4.0 K/uL   Monocytes Relative 6 %   Monocytes Absolute 0.4 0.1 - 1.0 K/uL   Eosinophils Relative 4 %   Eosinophils Absolute 0.2 0.0 - 0.5 K/uL   Basophils Relative  1 %   Basophils Absolute 0.1 0.0 - 0.1 K/uL   Immature Granulocytes 0 %   Abs Immature Granulocytes 0.01 0.00 - 0.07 K/uL  Basic metabolic panel     Status: Abnormal   Collection Time: 11/24/20 11:19 AM  Result Value Ref Range   Sodium 137 135 - 145 mmol/L   Potassium 3.3 (L) 3.5 - 5.1 mmol/L   Chloride 110 98 - 111 mmol/L   CO2 20 (L) 22 - 32 mmol/L   Glucose, Bld 166 (H) 70 - 99 mg/dL   BUN 11 6 - 20 mg/dL   Creatinine, Ser 1.54 (H) 0.44 - 1.00 mg/dL   Calcium 7.3 (L) 8.9 - 10.3 mg/dL   GFR, Estimated 47 (L) >60 mL/min   Anion gap 7 5 - 15  Glucose, capillary     Status: Abnormal   Collection Time: 11/24/20 12:05 PM  Result Value Ref Range   Glucose-Capillary 155 (H) 70 - 99 mg/dL     Disposition: TRANSFER TO [facility]    Discussed transfer to [facility] Dr. Dena Billet and with patient.  Discharge Instructions     Care order/instruction   Complete by: As directed    Transfuse Parameters   Type and screen   Complete by: As directed    [place]        I spent > 30 minutes in the direct care of this patient and her discharge. Signed: Jeannie Camacho 11/24/2020, 12:09 PM

## 2020-11-24 NOTE — Progress Notes (Signed)
Patient assisted up to [facility] then she sat on the side of bed. Patient began vomiting and removed oxygen. Patient oxygen saturation goes to mid 80's without oxygen. Reapplied oxygen. Patient wanted to sit up for a few minutes.

## 2020-11-24 NOTE — Progress Notes (Signed)
Hematology/Oncology Progress Note [facility] [phone] [phone]  Patient Care Team: Rubie Maid, MD as PCP - General (Obstetrics and Gynecology)   Name of the patient: Ashley Camacho  [id number]  [birth year]  Date of visit: 11/24/20   INTERVAL HISTORY-   11/20/2020.  Hemoglobin 6.1 1 unit of PRBC transfusion.  Post transfusion hemoglobin 7.3 11/21/2020, hemoglobin 7.5  IV Venofer 200 mg x 1.  11/21/2020, fever, tachycardia heart rate 120, respiratory 22, SPO2 89%, oxygen administrated.  Internal medicine was consulted. bilateral lower extremity venous ultrasound showed no evidence of DVT in either extremity.. 11/22/2020, CT chest PE protocol was obtained due to shortness of breath and chest pain, Regional PE.  Moderate bilateral pleural effusion with bilateral bibasilar atelectasis or consolidation.  Perihilar edema.  Additional nodular infiltrate foci throughout the lungs may represent superimposed multifocal pneumonia. Blood cultures sent. Started on antibiotics with Unasyn and Zithromax. 11/23/2020 hemoglobin 7.7, 1 unit of PRBC transfusion  11/24/2020, hemoglobin 8.4   Review of systems- Review of Systems  Constitutional:  Positive for appetite change and fatigue.  Eyes:  Negative for icterus.  Respiratory:  Positive for shortness of breath.   Cardiovascular:  Negative for chest pain.  Gastrointestinal:  Positive for nausea and vomiting.  Skin:  Negative for rash.  Neurological:  Negative for headaches.   No Known Allergies  Patient Active Problem List   Diagnosis Date Noted   Social problem 11/21/2020   Nausea and vomiting during pregnancy prior to [redacted] weeks gestation 11/21/2020   Low serum vitamin B12    Indication for care in labor and delivery, antepartum 11/20/2020   Anemia affecting pregnancy in second trimester    Absolute anemia    AKI (acute kidney injury) ([place])    Nausea and vomiting during pregnancy 10/23/2020    Adjustment disorder with mixed anxiety and depressed mood 05/14/2016   DKA (diabetic ketoacidoses) 04/03/2016     Past Medical History:  Diagnosis Date   Anemia    Diabetes mellitus without complication ([place])    Gastroparesis    Hypertension    Type 1 diabetes ([place])      Past Surgical History:  Procedure Laterality Date   CESAREAN SECTION     MOUTH SURGERY      Social History   Socioeconomic History   Marital status: Single    Spouse name: Not on file   Number of children: Not on file   Years of education: Not on file   Highest education level: Not on file  Occupational History   Occupation: home maker  Tobacco Use   Smoking status: Never   Smokeless tobacco: Never  Vaping Use   Vaping Use: Never used  Substance and Sexual Activity   Alcohol use: No   Drug use: No   Sexual activity: Yes    Birth control/protection: None  Other Topics Concern   Not on file  Social History Narrative   Not on file   Social Determinants of Health   Financial Resource Strain: Not on file  Food Insecurity: Not on file  Transportation Needs: Not on file  Physical Activity: Not on file  Stress: Not on file  Social Connections: Not on file  Intimate Partner Violence: Not on file     Family History  Problem Relation Age of Onset   Diabetes type I Father    CAD Father    CAD Paternal Grandmother    CAD Paternal Grandfather    Breast cancer Mother  Ovarian cancer Neg Hx      Current Facility-Administered Medications:    acetaminophen (TYLENOL) tablet 650 mg, 650 mg, Oral, Q4H PRN **OR** acetaminophen (TYLENOL) suppository 650 mg, 650 mg, Rectal, Q4H PRN, Mills Koller, RPH, 650 mg at 11/24/20 0605   Ampicillin-Sulbactam (UNASYN) 3 g in sodium chloride 0.9 % 100 mL IVPB, 3 g, Intravenous, Q6H, Harlin Heys, MD, Last Rate: 200 mL/hr at 11/24/20 0604, 3 g at 11/24/20 0604   azithromycin (ZITHROMAX) 500 mg in sodium chloride 0.9 % 250 mL IVPB, 500 mg, Intravenous,  Q24H, Mansy, Jan A, MD, Last Rate: 250 mL/hr at 11/24/20 0237, 500 mg at 11/24/20 3845   calcium carbonate (TUMS - dosed in mg elemental calcium) chewable tablet 400 mg of elemental calcium, 2 tablet, Oral, Q4H PRN, Harlin Heys, MD, 400 mg of elemental calcium at 11/24/20 0238   cyanocobalamin ((VITAMIN B-12)) injection 1,000 mcg, 1,000 mcg, Intramuscular, q1600, Earlie Server, MD, 1,000 mcg at 11/23/20 1449   dextrose 5 % in lactated ringers infusion, , Intravenous, Continuous, Fritzi Mandes, MD, Last Rate: 75 mL/hr at 11/24/20 0235, New Bag at 11/24/20 0235   enoxaparin (LOVENOX) injection 25 mg, 25 mg, Subcutaneous, Q12H, Harlin Heys, MD, 25 mg at 11/23/20 2245   furosemide (LASIX) injection 20 mg, 20 mg, Intravenous, Daily, Fritzi Mandes, MD, 20 mg at 11/24/20 1021   guaiFENesin (MUCINEX) 12 hr tablet 600 mg, 600 mg, Oral, BID, Mansy, Jan A, MD, 600 mg at 11/23/20 2243   heparin lock flush 100 unit/mL, 500 Units, Intracatheter, Daily PRN, Earlie Server, MD   heparin lock flush 100 unit/mL, 250 Units, Intracatheter, PRN, Earlie Server, MD   insulin pump, , Subcutaneous, Q4H, Harlin Heys, MD, Given at 11/24/20 0022   metoCLOPramide (REGLAN) injection 10 mg, 10 mg, Intravenous, Q6H, Rubie Maid, MD, 10 mg at 11/24/20 0559   ondansetron (ZOFRAN-ODT) disintegrating tablet 8 mg, 8 mg, Oral, Q8H, Cherry, Dolphus Jenny, MD, 8 mg at 11/21/20 1004   pantoprazole (PROTONIX) EC tablet 40 mg, 40 mg, Oral, Daily, Harlin Heys, MD, 40 mg at 11/23/20 1036   phenol (CHLORASEPTIC) mouth spray 1 spray, 1 spray, Mouth/Throat, PRN, Harlin Heys, MD, 1 spray at 11/22/20 1804   prenatal multivitamin tablet 1 tablet, 1 tablet, Oral, Q1200, Harlin Heys, MD   promethazine (PHENERGAN) 25 mg in sodium chloride 0.9 % 50 mL IVPB, 25 mg, Intravenous, Q6H PRN, Harlin Heys, MD, Last Rate: 200 mL/hr at 11/24/20 0331, 25 mg at 11/24/20 0331   scopolamine (TRANSDERM-SCOP) 1 MG/3DAYS 1.5 mg, 1 patch,  Transdermal, Q72H, Rubie Maid, MD, 1.5 mg at 11/24/20 1028   sodium chloride flush (NS) 0.9 % injection 10 mL, 10 mL, Intracatheter, PRN, Earlie Server, MD   sodium chloride flush (NS) 0.9 % injection 3 mL, 3 mL, Intracatheter, PRN, Earlie Server, MD   zinc sulfate capsule 220 mg, 220 mg, Oral, Daily, Earlie Server, MD   zolpidem (AMBIEN) tablet 5 mg, 5 mg, Oral, QHS PRN, Harlin Heys, MD   Physical exam:  Vitals:   11/24/20 0818 11/24/20 0819 11/24/20 0820 11/24/20 0821  BP:      Pulse: 95 96 96 96  Resp:      Temp:      TempSrc:      SpO2: 94% 95% 95% 95%  Weight:      Height:       Physical Exam Constitutional:      Appearance: She is not diaphoretic.  Comments: Mildly distressed due to nausea  HENT:     Head: Normocephalic and atraumatic.     Nose: Nose normal.     Mouth/Throat:     Pharynx: No oropharyngeal exudate.  Eyes:     General: No scleral icterus.    Pupils: Pupils are equal, round, and reactive to light.  Cardiovascular:     Rate and Rhythm: Normal rate and regular rhythm.     Heart sounds: No murmur heard. Pulmonary:     Effort: Pulmonary effort is normal. No respiratory distress.     Comments: crackles bilateral lung base Chest:     Chest wall: No tenderness.  Abdominal:     Comments: Gravid uterus  Musculoskeletal:        General: Normal range of motion.     Cervical back: Normal range of motion and neck supple.  Skin:    General: Skin is warm and dry.     Coloration: Skin is pale.     Findings: No erythema.  Neurological:     Mental Status: She is alert and oriented to person, place, and time.     Cranial Nerves: No cranial nerve deficit.     Motor: No abnormal muscle tone.     Coordination: Coordination normal.  Psychiatric:        Mood and Affect: Affect normal.       CMP Latest Ref Rng & Units 11/23/2020  Glucose 70 - 99 mg/dL -  BUN 6 - 20 mg/dL -  Creatinine 0.44 - 1.00 mg/dL -  Sodium 135 - 145 mmol/L -  Potassium 3.5 - 5.1 mmol/L -   Chloride 98 - 111 mmol/L -  CO2 22 - 32 mmol/L -  Calcium 8.9 - 10.3 mg/dL -  Total Protein 6.5 - 8.1 g/dL -  Total Bilirubin 0.3 - 1.2 mg/dL 1.0  Alkaline Phos 38 - 126 U/L -  AST 15 - 41 U/L -  ALT 0 - 44 U/L -   CBC Latest Ref Rng & Units 11/23/2020  WBC 4.0 - 10.5 K/uL 5.3  Hemoglobin 12.0 - 15.0 g/dL 6.7(L)  Hematocrit 36.0 - 46.0 % 19.9(L)  Platelets 150 - 400 K/uL 126(L)    RADIOGRAPHIC STUDIES: I have personally reviewed the radiological images as listed and agreed with the findings in the report. CT Angio Chest Pulmonary Embolism (PE) W or WO Contrast  Result Date: 11/22/2020 CLINICAL DATA:  Pulmonary embolus suspected with low to intermediate probability. Positive D-dimer. Shortness of breath and chest pain. EXAM: CT ANGIOGRAPHY CHEST WITH CONTRAST TECHNIQUE: Multidetector CT imaging of the chest was performed using the standard protocol during bolus administration of intravenous contrast. Multiplanar CT image reconstructions and MIPs were obtained to evaluate the vascular anatomy. CONTRAST:  89m OMNIPAQUE IOHEXOL 350 MG/ML SOLN COMPARISON:  05/14/2016 FINDINGS: Cardiovascular: Good opacification of the central and segmental pulmonary arteries. No focal filling defects. No evidence of significant pulmonary embolus. Normal heart size. No pericardial effusions. Normal caliber thoracic aorta. No dissection. Mediastinum/Nodes: No significant lymphadenopathy in the chest. Esophagus is decompressed. Thyroid gland is unremarkable. Lungs/Pleura: Motion artifact limits examination. There are moderate bilateral pleural effusions with basilar atelectasis or consolidation. Perihilar infiltrates and interstitial changes in the lungs likely to represent edema. Focal nodular infiltrates may represent multifocal pneumonia. Upper Abdomen: No acute abnormalities demonstrated in the visualized upper abdomen. Musculoskeletal: No chest wall abnormality. No acute or significant osseous findings. Review of  the MIP images confirms the above findings. IMPRESSION: 1. No evidence of  significant pulmonary embolus. 2. Moderate bilateral pleural effusions with bilateral basilar atelectasis or consolidation. 3. Probable perihilar edema. 4. Additional nodular infiltrative foci throughout the lungs may represent superimposed multifocal pneumonia. Electronically Signed   By: Lucienne Capers M.D.   On: 11/22/2020 01:10   US Venous Img Lower Bilateral (DVT)  Result Date: 11/22/2020 CLINICAL DATA:  Twenty-one week gravid female with bilateral lower extremity edema EXAM: BILATERAL LOWER EXTREMITY VENOUS DOPPLER ULTRASOUND TECHNIQUE: Gray-scale sonography with graded compression, as well as color Doppler and duplex ultrasound were performed to evaluate the lower extremity deep venous systems from the level of the common femoral vein and including the common femoral, femoral, profunda femoral, popliteal and calf veins including the posterior tibial, peroneal and gastrocnemius veins when visible. The superficial great saphenous vein was also interrogated. Spectral Doppler was utilized to evaluate flow at rest and with distal augmentation maneuvers in the common femoral, femoral and popliteal veins. COMPARISON:  None. FINDINGS: RIGHT LOWER EXTREMITY Common Femoral Vein: No evidence of thrombus. Normal compressibility, respiratory phasicity and response to augmentation. Saphenofemoral Junction: No evidence of thrombus. Normal compressibility and flow on color Doppler imaging. Profunda Femoral Vein: No evidence of thrombus. Normal compressibility and flow on color Doppler imaging. Femoral Vein: No evidence of thrombus. Normal compressibility, respiratory phasicity and response to augmentation. Popliteal Vein: No evidence of thrombus. Normal compressibility, respiratory phasicity and response to augmentation. Calf Veins: No evidence of thrombus. Normal compressibility and flow on color Doppler imaging. Superficial Great Saphenous Vein:  No evidence of thrombus. Normal compressibility. Venous Reflux:  None. Other Findings:  None. LEFT LOWER EXTREMITY Common Femoral Vein: No evidence of thrombus. Normal compressibility, respiratory phasicity and response to augmentation. Saphenofemoral Junction: No evidence of thrombus. Normal compressibility and flow on color Doppler imaging. Profunda Femoral Vein: No evidence of thrombus. Normal compressibility and flow on color Doppler imaging. Femoral Vein: No evidence of thrombus. Normal compressibility, respiratory phasicity and response to augmentation. Popliteal Vein: No evidence of thrombus. Normal compressibility, respiratory phasicity and response to augmentation. Calf Veins: No evidence of thrombus. Normal compressibility and flow on color Doppler imaging. Superficial Great Saphenous Vein: No evidence of thrombus. Normal compressibility. Venous Reflux:  None. Other Findings:  None. IMPRESSION: No evidence of deep venous thrombosis in either lower extremity. Electronically Signed   By: Jacqulynn Cadet M.D.   On: 11/22/2020 08:10   [place] MFM OB COMP + 14 WK  Result Date: 11/13/2020 ----------------------------------------------------------------------  OBSTETRICS REPORT                       (Signed Final 11/13/2020 09:13 am) ---------------------------------------------------------------------- Patient Info  ID #:       [id number]                          D.O.B.:  December 04, 1990 (29 yrs)  Name:       KEM PARCHER [facility]                 Visit Date: 11/11/2020 05:45 pm ---------------------------------------------------------------------- Performed By  Attending:        Sander Nephew      Ref. Address:     [facility]                                                             [place]                                                             [place]  Performed By:     Nanetta Batty      Location[facility]         [facility]                                                             [facility]  Referred By:      Harlin Heys MD ---------------------------------------------------------------------- Orders  #  Description                           Code        Ordered By  1  [place] MFM OB COMP + 14 WK                76805.01    DAVID EVANS ----------------------------------------------------------------------  #  Order #                     Accession #  Episode #  1  [id number]                   [id number]                 [id number] ---------------------------------------------------------------------- Indications  [redacted] weeks gestation of pregnancy                Z3A.20  Anemia during pregnancy in second trimester    O99.012  Pre-existing diabetes, type 1, in pregnancy,   O24.012  second trimester ---------------------------------------------------------------------- Fetal Evaluation  Num Of Fetuses:         1  Fetal Heart Rate(bpm):  137  Cardiac Activity:       Observed  Presentation:           Variable  Placenta:               Posterior  Amniotic Fluid  AFI FV:      Within normal limits ---------------------------------------------------------------------- Biometry  BPD:      48.5  mm     G. Age:  20w 5d         71  %    CI:        73.74   %    70 - 86                                                          FL/HC:      18.0   %    16.8 - 19.8  HC:      179.4  mm     G. Age:  20w 3d         39  %    HC/AC:      1.19        1.09 - 1.39  AC:      150.2  mm     G. Age:  20w 2d         48  %    FL/BPD:     66.6   %  FL:       32.3  mm     G. Age:  20w 0d         39  %    FL/AC:      21.5   %    20 - 24  HUM:       31.2  mm     G. Age:  20w 3d         40  %  Est. FW:     339  gm    0 lb 12 oz      49  % ---------------------------------------------------------------------- Gestational Age  U/S Today:     20w 3d                                        EDD:   03/28/21  Best:          20w 1d     Det. ByLoman Chroman         EDD:   03/30/21                                      (  08/27/20) ---------------------------------------------------------------------- Anatomy  Cranium:               Appears normal         Aortic Arch:            Appears normal  Cavum:                 Appears normal         Ductal Arch:            Appears normal  Ventricles:            Appears normal         Diaphragm:              Appears normal  Choroid Plexus:        Appears normal         Stomach:                Appears normal, left                                                                        sided  Cerebellum:            Appears normal         Abdomen:                Appears normal  Posterior Fossa:       Appears normal         Abdominal Wall:         Appears nml (cord                                                                        insert, abd wall)  Nuchal Fold:           Appears normal         Cord Vessels:           Appears normal (3                                                                        vessel cord)  Lips:                  Appears normal         Kidneys:                Appear normal  Thoracic:              Appears normal         Bladder:                Appears normal  Heart:  Appears normal         Spine:                  Appears normal                         (4CH, axis, and                         situs)  RVOT:                  Appears normal         Upper Extremities:      Appears normal  LVOT:                  Appears normal         Lower Extremities:      Appears normal ---------------------------------------------------------------------- Cervix Uterus Adnexa  Cervix  Length:            3.9   cm. ---------------------------------------------------------------------- Impression  Single intrauterine pregnancy here for a detailed anatomy  due to type 1 diabetes  Ms. Latella had a low risk NIPS and AFP.  Normal anatomy with measurements consistent with dates  There is good fetal movement and amniotic fluid volume  I reviewed today's study and recommend serial growth, fetal  echocardiogram and initiation of weekly testing at 32 weeks.  We discussed the increased risk for fetal macrosomia,  cardiac defects, maternal and fetal birth trauma with  temporary and/or permenant damage, stillbirth and neonatal  ICU admission.  In addition we discussed starting daily low dose ASA for the  prevention of preeclampsia.  She reports good blood sugars overall are better she was  having low's due to N/V. ---------------------------------------------------------------------- Recommendations  Follow up growth in 4 weeks  Fetal echocardiogram between 22-26 weeks. ----------------------------------------------------------------------               Sander Nephew, MD Electronically Signed Final Report   11/13/2020 09:13 am ----------------------------------------------------------------------   Assessment and plan-   #Acute on chronic anemia during second trimester pregnancy. Pathology smear showed no schistocytes, blasts. Reticulocyte panel showed inappropriately normal reticulocyte percentage.-Indicating marrow underproduction. Status post 2 unit of PRBC transfusion.  Hemoglobin increased to 8.5. # Iron deficiency iron panel was not helpful with a ferritin of 626-likely secondary to acute inflammation/infection.  10/30/2020 ferritin was 11.8 at [facility].  Reticulocyte panel showed normal reticulocyte hemoglobin.  MCV is normal.-Not typical for severe iron deficiency. Patient was given empiric IV Venofer 200 mg x 1. LDH is normal.  Normal bilirubin, less likely hemolysis. Haptoglobin pending.  Hemoglobinopathy evaluation  pending, SPEP pending, parvovirus pending. Discussed with patient that if all work-up is negative and hemoglobin is now stable, would consider bone marrow biopsy.  She agrees.  # Borderline low B12 level, started on vitamin B12 intramuscular injection 1000 MCG daily.  Ordered 5 doses. #Zinc deficiency, started on oral zinc supplementation.  Zinc level to be decreased due to hypoalbuminemia.  #Hypoxia, felt to be secondary to multifocal pneumonia, Check BMP #Bilateral pleural effusion, pulmonary edema.  Differentials include volume overload, CHF, TACO, TRALI [onset of hypoxia was >6 hours after 6/30 blood transfusion, less likely].  I recommend to check an echocardiogram.  #AKI, creatinine increased to 1.54.  Possible due to recent contrast exposure versus other etiologies.  Thank you for allowing me to participate in the care of this patient.  Discussed with Dr. Amalia Hailey.  Talbert Cage  Tasia Catchings, MD, PhD Hematology Oncology [facility] at [facility] Pager- [phone] 11/24/2020

## 2020-11-24 NOTE — Progress Notes (Signed)
Patient refused PO medications at this time due to nausea and vomiting. Patient states that she cannot keep them down. See MAR, giving patient 25mg  of Phenergan IV.

## 2020-11-24 NOTE — Progress Notes (Signed)
Patient up to chair for breakfast. Patient felt short of breath while moving from chair to bed, about 4 steps away. Patient sat in chair for breakfast. Patient states that she took 2 bites of her oatmeal and 2 bites of her peaches. Patient complains of n/v after breakfast and states that she is very weak and tired and wanted to get back in the bed. Patient used incentive spirometer, max reached was 250.

## 2020-11-24 NOTE — Progress Notes (Signed)
RN observed large bruise on patient's left arm extending from palm of the hand down forearm. Patient states that it is from her previous IV, but it has gotten bigger. Bruise is black and blue with purplish and red mixed throughout. The bruise is 16.5cm in legnth and 6.5cm in width. Provider made aware.

## 2020-11-24 NOTE — Progress Notes (Signed)
Give report to [facility] L&D Triage Nurse, Prentiss Bells, RN.

## 2020-11-24 NOTE — Progress Notes (Signed)
Give report to Sherrlyn Hock, RN with [employer]. Patient off the unit at this time, being transferred to [facility] by [facility].

## 2020-11-25 DIAGNOSIS — J811 Chronic pulmonary edema: Secondary | ICD-10-CM | POA: Diagnosis not present

## 2020-11-25 DIAGNOSIS — J90 Pleural effusion, not elsewhere classified: Secondary | ICD-10-CM | POA: Diagnosis not present

## 2020-11-25 DIAGNOSIS — Z3A22 22 weeks gestation of pregnancy: Secondary | ICD-10-CM | POA: Diagnosis not present

## 2020-11-25 DIAGNOSIS — J189 Pneumonia, unspecified organism: Secondary | ICD-10-CM | POA: Insufficient documentation

## 2020-11-25 DIAGNOSIS — J918 Pleural effusion in other conditions classified elsewhere: Secondary | ICD-10-CM | POA: Insufficient documentation

## 2020-11-25 DIAGNOSIS — N179 Acute kidney failure, unspecified: Secondary | ICD-10-CM | POA: Diagnosis not present

## 2020-11-25 DIAGNOSIS — I340 Nonrheumatic mitral (valve) insufficiency: Secondary | ICD-10-CM | POA: Diagnosis not present

## 2020-11-25 DIAGNOSIS — I371 Nonrheumatic pulmonary valve insufficiency: Secondary | ICD-10-CM | POA: Diagnosis not present

## 2020-11-25 DIAGNOSIS — E1143 Type 2 diabetes mellitus with diabetic autonomic (poly)neuropathy: Secondary | ICD-10-CM | POA: Diagnosis not present

## 2020-11-25 DIAGNOSIS — Z98891 History of uterine scar from previous surgery: Secondary | ICD-10-CM | POA: Diagnosis not present

## 2020-11-25 DIAGNOSIS — E1042 Type 1 diabetes mellitus with diabetic polyneuropathy: Secondary | ICD-10-CM | POA: Diagnosis not present

## 2020-11-25 LAB — URINE CULTURE: Culture: NO GROWTH

## 2020-11-25 LAB — HAPTOGLOBIN: Haptoglobin: 115 mg/dL (ref 33–278)

## 2020-11-26 DIAGNOSIS — Z9641 Presence of insulin pump (external) (internal): Secondary | ICD-10-CM | POA: Diagnosis not present

## 2020-11-26 DIAGNOSIS — E1069 Type 1 diabetes mellitus with other specified complication: Secondary | ICD-10-CM | POA: Diagnosis not present

## 2020-11-26 DIAGNOSIS — Z3A22 22 weeks gestation of pregnancy: Secondary | ICD-10-CM | POA: Diagnosis not present

## 2020-11-26 DIAGNOSIS — I420 Dilated cardiomyopathy: Secondary | ICD-10-CM | POA: Diagnosis not present

## 2020-11-26 DIAGNOSIS — J189 Pneumonia, unspecified organism: Secondary | ICD-10-CM | POA: Diagnosis not present

## 2020-11-26 DIAGNOSIS — J90 Pleural effusion, not elsewhere classified: Secondary | ICD-10-CM | POA: Diagnosis not present

## 2020-11-26 LAB — COMP PANEL: LEUKEMIA/LYMPHOMA

## 2020-11-26 LAB — CULTURE, BLOOD (ROUTINE X 2)
Culture: NO GROWTH
Culture: NO GROWTH

## 2020-11-26 LAB — PROTEIN ELECTROPHORESIS, SERUM
A/G Ratio: 1 (ref 0.7–1.7)
Albumin ELP: 2.4 g/dL — ABNORMAL LOW (ref 2.9–4.4)
Alpha-1-Globulin: 0.4 g/dL (ref 0.0–0.4)
Alpha-2-Globulin: 0.7 g/dL (ref 0.4–1.0)
Beta Globulin: 0.7 g/dL (ref 0.7–1.3)
Gamma Globulin: 0.5 g/dL (ref 0.4–1.8)
Globulin, Total: 2.3 g/dL (ref 2.2–3.9)
Total Protein ELP: 4.7 g/dL — ABNORMAL LOW (ref 6.0–8.5)

## 2020-11-27 DIAGNOSIS — Z9641 Presence of insulin pump (external) (internal): Secondary | ICD-10-CM | POA: Diagnosis not present

## 2020-11-27 DIAGNOSIS — J189 Pneumonia, unspecified organism: Secondary | ICD-10-CM | POA: Diagnosis not present

## 2020-11-27 DIAGNOSIS — E1042 Type 1 diabetes mellitus with diabetic polyneuropathy: Secondary | ICD-10-CM | POA: Diagnosis not present

## 2020-11-27 DIAGNOSIS — E1069 Type 1 diabetes mellitus with other specified complication: Secondary | ICD-10-CM | POA: Diagnosis not present

## 2020-11-27 DIAGNOSIS — Z3A22 22 weeks gestation of pregnancy: Secondary | ICD-10-CM | POA: Diagnosis not present

## 2020-11-27 DIAGNOSIS — Z98891 History of uterine scar from previous surgery: Secondary | ICD-10-CM | POA: Diagnosis not present

## 2020-11-27 DIAGNOSIS — O24419 Gestational diabetes mellitus in pregnancy, unspecified control: Secondary | ICD-10-CM | POA: Diagnosis not present

## 2020-11-27 LAB — HGB FRACTIONATION CASCADE
Hgb A2: 2.7 % (ref 1.8–3.2)
Hgb A: 97.3 % (ref 96.4–98.8)
Hgb F: 0 % (ref 0.0–2.0)
Hgb S: 0 %

## 2020-11-28 DIAGNOSIS — O24419 Gestational diabetes mellitus in pregnancy, unspecified control: Secondary | ICD-10-CM | POA: Diagnosis not present

## 2020-11-28 DIAGNOSIS — Z9641 Presence of insulin pump (external) (internal): Secondary | ICD-10-CM | POA: Diagnosis not present

## 2020-11-28 DIAGNOSIS — H9313 Tinnitus, bilateral: Secondary | ICD-10-CM | POA: Diagnosis not present

## 2020-11-28 DIAGNOSIS — Z3A22 22 weeks gestation of pregnancy: Secondary | ICD-10-CM | POA: Diagnosis not present

## 2020-11-28 DIAGNOSIS — Z98891 History of uterine scar from previous surgery: Secondary | ICD-10-CM | POA: Diagnosis not present

## 2020-11-28 DIAGNOSIS — E1069 Type 1 diabetes mellitus with other specified complication: Secondary | ICD-10-CM | POA: Diagnosis not present

## 2020-11-28 DIAGNOSIS — J189 Pneumonia, unspecified organism: Secondary | ICD-10-CM | POA: Diagnosis not present

## 2020-11-29 DIAGNOSIS — J189 Pneumonia, unspecified organism: Secondary | ICD-10-CM | POA: Diagnosis not present

## 2020-11-29 DIAGNOSIS — K3184 Gastroparesis: Secondary | ICD-10-CM | POA: Diagnosis not present

## 2020-11-29 DIAGNOSIS — E1042 Type 1 diabetes mellitus with diabetic polyneuropathy: Secondary | ICD-10-CM | POA: Diagnosis not present

## 2020-11-29 DIAGNOSIS — O99512 Diseases of the respiratory system complicating pregnancy, second trimester: Secondary | ICD-10-CM | POA: Diagnosis not present

## 2020-11-30 DIAGNOSIS — E1143 Type 2 diabetes mellitus with diabetic autonomic (poly)neuropathy: Secondary | ICD-10-CM | POA: Diagnosis not present

## 2020-11-30 DIAGNOSIS — E1042 Type 1 diabetes mellitus with diabetic polyneuropathy: Secondary | ICD-10-CM | POA: Diagnosis not present

## 2020-11-30 DIAGNOSIS — J189 Pneumonia, unspecified organism: Secondary | ICD-10-CM | POA: Diagnosis not present

## 2020-11-30 DIAGNOSIS — Z98891 History of uterine scar from previous surgery: Secondary | ICD-10-CM | POA: Diagnosis not present

## 2020-12-01 DIAGNOSIS — Z98891 History of uterine scar from previous surgery: Secondary | ICD-10-CM | POA: Diagnosis not present

## 2020-12-01 DIAGNOSIS — E1042 Type 1 diabetes mellitus with diabetic polyneuropathy: Secondary | ICD-10-CM | POA: Diagnosis not present

## 2020-12-01 DIAGNOSIS — Z3A22 22 weeks gestation of pregnancy: Secondary | ICD-10-CM | POA: Diagnosis not present

## 2020-12-01 DIAGNOSIS — Z131 Encounter for screening for diabetes mellitus: Secondary | ICD-10-CM | POA: Diagnosis not present

## 2020-12-01 DIAGNOSIS — O358XX0 Maternal care for other (suspected) fetal abnormality and damage, not applicable or unspecified: Secondary | ICD-10-CM | POA: Diagnosis not present

## 2020-12-01 DIAGNOSIS — Z3683 Encounter for fetal screening for congenital cardiac abnormalities: Secondary | ICD-10-CM | POA: Diagnosis not present

## 2020-12-01 DIAGNOSIS — E1143 Type 2 diabetes mellitus with diabetic autonomic (poly)neuropathy: Secondary | ICD-10-CM | POA: Diagnosis not present

## 2020-12-01 DIAGNOSIS — K3184 Gastroparesis: Secondary | ICD-10-CM | POA: Diagnosis not present

## 2020-12-01 DIAGNOSIS — J189 Pneumonia, unspecified organism: Secondary | ICD-10-CM | POA: Diagnosis not present

## 2020-12-01 LAB — HUMAN PARVOVIRUS DNA DETECTION BY PCR: Parvovirus B19, PCR: NEGATIVE

## 2020-12-02 DIAGNOSIS — Z3A22 22 weeks gestation of pregnancy: Secondary | ICD-10-CM | POA: Diagnosis not present

## 2020-12-02 DIAGNOSIS — E1069 Type 1 diabetes mellitus with other specified complication: Secondary | ICD-10-CM | POA: Diagnosis not present

## 2020-12-02 DIAGNOSIS — E1042 Type 1 diabetes mellitus with diabetic polyneuropathy: Secondary | ICD-10-CM | POA: Diagnosis not present

## 2020-12-02 DIAGNOSIS — J189 Pneumonia, unspecified organism: Secondary | ICD-10-CM | POA: Diagnosis not present

## 2020-12-02 DIAGNOSIS — Z3A23 23 weeks gestation of pregnancy: Secondary | ICD-10-CM | POA: Diagnosis not present

## 2020-12-02 DIAGNOSIS — Z3689 Encounter for other specified antenatal screening: Secondary | ICD-10-CM | POA: Diagnosis not present

## 2020-12-02 DIAGNOSIS — Z98891 History of uterine scar from previous surgery: Secondary | ICD-10-CM | POA: Diagnosis not present

## 2020-12-02 DIAGNOSIS — O24012 Pre-existing diabetes mellitus, type 1, in pregnancy, second trimester: Secondary | ICD-10-CM | POA: Diagnosis not present

## 2020-12-02 DIAGNOSIS — E1143 Type 2 diabetes mellitus with diabetic autonomic (poly)neuropathy: Secondary | ICD-10-CM | POA: Diagnosis not present

## 2020-12-03 DIAGNOSIS — Z3A23 23 weeks gestation of pregnancy: Secondary | ICD-10-CM | POA: Diagnosis not present

## 2020-12-03 DIAGNOSIS — E1069 Type 1 diabetes mellitus with other specified complication: Secondary | ICD-10-CM | POA: Diagnosis not present

## 2020-12-04 ENCOUNTER — Encounter: Payer: Self-pay | Admitting: Obstetrics and Gynecology

## 2020-12-04 DIAGNOSIS — E1143 Type 2 diabetes mellitus with diabetic autonomic (poly)neuropathy: Secondary | ICD-10-CM | POA: Diagnosis not present

## 2020-12-04 DIAGNOSIS — Z3A23 23 weeks gestation of pregnancy: Secondary | ICD-10-CM | POA: Diagnosis not present

## 2020-12-04 DIAGNOSIS — J189 Pneumonia, unspecified organism: Secondary | ICD-10-CM | POA: Diagnosis not present

## 2020-12-04 DIAGNOSIS — Z98891 History of uterine scar from previous surgery: Secondary | ICD-10-CM | POA: Diagnosis not present

## 2020-12-04 DIAGNOSIS — E1069 Type 1 diabetes mellitus with other specified complication: Secondary | ICD-10-CM | POA: Diagnosis not present

## 2020-12-04 DIAGNOSIS — Z3A22 22 weeks gestation of pregnancy: Secondary | ICD-10-CM | POA: Diagnosis not present

## 2020-12-05 DIAGNOSIS — J9811 Atelectasis: Secondary | ICD-10-CM | POA: Diagnosis not present

## 2020-12-05 DIAGNOSIS — K3184 Gastroparesis: Secondary | ICD-10-CM | POA: Diagnosis not present

## 2020-12-05 DIAGNOSIS — J811 Chronic pulmonary edema: Secondary | ICD-10-CM | POA: Diagnosis not present

## 2020-12-05 DIAGNOSIS — E1142 Type 2 diabetes mellitus with diabetic polyneuropathy: Secondary | ICD-10-CM | POA: Diagnosis not present

## 2020-12-05 DIAGNOSIS — E1042 Type 1 diabetes mellitus with diabetic polyneuropathy: Secondary | ICD-10-CM | POA: Diagnosis not present

## 2020-12-05 DIAGNOSIS — J90 Pleural effusion, not elsewhere classified: Secondary | ICD-10-CM | POA: Diagnosis not present

## 2020-12-05 DIAGNOSIS — J189 Pneumonia, unspecified organism: Secondary | ICD-10-CM | POA: Diagnosis not present

## 2020-12-05 DIAGNOSIS — E1143 Type 2 diabetes mellitus with diabetic autonomic (poly)neuropathy: Secondary | ICD-10-CM | POA: Diagnosis not present

## 2020-12-05 DIAGNOSIS — Z3A22 22 weeks gestation of pregnancy: Secondary | ICD-10-CM | POA: Diagnosis not present

## 2020-12-05 DIAGNOSIS — Z98891 History of uterine scar from previous surgery: Secondary | ICD-10-CM | POA: Diagnosis not present

## 2020-12-06 DIAGNOSIS — K3184 Gastroparesis: Secondary | ICD-10-CM | POA: Diagnosis not present

## 2020-12-06 DIAGNOSIS — J189 Pneumonia, unspecified organism: Secondary | ICD-10-CM | POA: Diagnosis not present

## 2020-12-06 DIAGNOSIS — E1143 Type 2 diabetes mellitus with diabetic autonomic (poly)neuropathy: Secondary | ICD-10-CM | POA: Diagnosis not present

## 2020-12-06 DIAGNOSIS — R627 Adult failure to thrive: Secondary | ICD-10-CM | POA: Diagnosis not present

## 2020-12-07 DIAGNOSIS — Z98891 History of uterine scar from previous surgery: Secondary | ICD-10-CM | POA: Diagnosis not present

## 2020-12-07 DIAGNOSIS — J189 Pneumonia, unspecified organism: Secondary | ICD-10-CM | POA: Diagnosis not present

## 2020-12-07 DIAGNOSIS — I82621 Acute embolism and thrombosis of deep veins of right upper extremity: Secondary | ICD-10-CM | POA: Diagnosis not present

## 2020-12-07 DIAGNOSIS — R627 Adult failure to thrive: Secondary | ICD-10-CM | POA: Diagnosis not present

## 2020-12-07 DIAGNOSIS — R2231 Localized swelling, mass and lump, right upper limb: Secondary | ICD-10-CM | POA: Diagnosis not present

## 2020-12-07 DIAGNOSIS — I82611 Acute embolism and thrombosis of superficial veins of right upper extremity: Secondary | ICD-10-CM | POA: Diagnosis not present

## 2020-12-08 DIAGNOSIS — Z98891 History of uterine scar from previous surgery: Secondary | ICD-10-CM | POA: Diagnosis not present

## 2020-12-08 DIAGNOSIS — E10649 Type 1 diabetes mellitus with hypoglycemia without coma: Secondary | ICD-10-CM | POA: Diagnosis not present

## 2020-12-08 DIAGNOSIS — E1143 Type 2 diabetes mellitus with diabetic autonomic (poly)neuropathy: Secondary | ICD-10-CM | POA: Diagnosis not present

## 2020-12-08 DIAGNOSIS — J189 Pneumonia, unspecified organism: Secondary | ICD-10-CM | POA: Diagnosis not present

## 2020-12-08 DIAGNOSIS — O24012 Pre-existing diabetes mellitus, type 1, in pregnancy, second trimester: Secondary | ICD-10-CM | POA: Diagnosis not present

## 2020-12-08 DIAGNOSIS — Z3A22 22 weeks gestation of pregnancy: Secondary | ICD-10-CM | POA: Diagnosis not present

## 2020-12-08 DIAGNOSIS — K3184 Gastroparesis: Secondary | ICD-10-CM | POA: Diagnosis not present

## 2020-12-09 DIAGNOSIS — J189 Pneumonia, unspecified organism: Secondary | ICD-10-CM | POA: Diagnosis not present

## 2020-12-09 DIAGNOSIS — Z98891 History of uterine scar from previous surgery: Secondary | ICD-10-CM | POA: Diagnosis not present

## 2020-12-09 DIAGNOSIS — E1143 Type 2 diabetes mellitus with diabetic autonomic (poly)neuropathy: Secondary | ICD-10-CM | POA: Diagnosis not present

## 2020-12-09 DIAGNOSIS — E1043 Type 1 diabetes mellitus with diabetic autonomic (poly)neuropathy: Secondary | ICD-10-CM | POA: Diagnosis not present

## 2020-12-09 DIAGNOSIS — Z3A22 22 weeks gestation of pregnancy: Secondary | ICD-10-CM | POA: Diagnosis not present

## 2020-12-09 DIAGNOSIS — E1069 Type 1 diabetes mellitus with other specified complication: Secondary | ICD-10-CM | POA: Diagnosis not present

## 2020-12-09 DIAGNOSIS — O24012 Pre-existing diabetes mellitus, type 1, in pregnancy, second trimester: Secondary | ICD-10-CM | POA: Diagnosis not present

## 2020-12-09 DIAGNOSIS — E10649 Type 1 diabetes mellitus with hypoglycemia without coma: Secondary | ICD-10-CM | POA: Diagnosis not present

## 2020-12-09 DIAGNOSIS — Z3A24 24 weeks gestation of pregnancy: Secondary | ICD-10-CM | POA: Diagnosis not present

## 2020-12-09 DIAGNOSIS — K3184 Gastroparesis: Secondary | ICD-10-CM | POA: Diagnosis not present

## 2020-12-10 DIAGNOSIS — H4311 Vitreous hemorrhage, right eye: Secondary | ICD-10-CM | POA: Diagnosis present

## 2020-12-10 DIAGNOSIS — O99012 Anemia complicating pregnancy, second trimester: Secondary | ICD-10-CM | POA: Diagnosis not present

## 2020-12-10 DIAGNOSIS — E1143 Type 2 diabetes mellitus with diabetic autonomic (poly)neuropathy: Secondary | ICD-10-CM | POA: Diagnosis not present

## 2020-12-10 DIAGNOSIS — Z98891 History of uterine scar from previous surgery: Secondary | ICD-10-CM | POA: Diagnosis not present

## 2020-12-10 DIAGNOSIS — E039 Hypothyroidism, unspecified: Secondary | ICD-10-CM | POA: Insufficient documentation

## 2020-12-10 DIAGNOSIS — O2232 Deep phlebothrombosis in pregnancy, second trimester: Secondary | ICD-10-CM | POA: Diagnosis not present

## 2020-12-10 DIAGNOSIS — E46 Unspecified protein-calorie malnutrition: Secondary | ICD-10-CM | POA: Insufficient documentation

## 2020-12-12 ENCOUNTER — Inpatient Hospital Stay
Admission: EM | Admit: 2020-12-12 | Discharge: 2020-12-16 | DRG: 831 | Disposition: A | Discharge status: Other Acute Inpatient Hospital | Payer: Medicaid Other | Attending: Hospitalist | Admitting: Hospitalist

## 2020-12-12 ENCOUNTER — Other Ambulatory Visit: Payer: Self-pay

## 2020-12-12 DIAGNOSIS — O99412 Diseases of the circulatory system complicating pregnancy, second trimester: Secondary | ICD-10-CM | POA: Diagnosis present

## 2020-12-12 DIAGNOSIS — Z833 Family history of diabetes mellitus: Secondary | ICD-10-CM

## 2020-12-12 DIAGNOSIS — O24012 Pre-existing diabetes mellitus, type 1, in pregnancy, second trimester: Secondary | ICD-10-CM | POA: Diagnosis not present

## 2020-12-12 DIAGNOSIS — Z20822 Contact with and (suspected) exposure to covid-19: Secondary | ICD-10-CM | POA: Diagnosis present

## 2020-12-12 DIAGNOSIS — E8729 Other acidosis: Secondary | ICD-10-CM | POA: Diagnosis present

## 2020-12-12 DIAGNOSIS — L559 Sunburn, unspecified: Secondary | ICD-10-CM

## 2020-12-12 DIAGNOSIS — T730XXA Starvation, initial encounter: Secondary | ICD-10-CM | POA: Diagnosis present

## 2020-12-12 DIAGNOSIS — E1069 Type 1 diabetes mellitus with other specified complication: Secondary | ICD-10-CM | POA: Diagnosis not present

## 2020-12-12 DIAGNOSIS — E10649 Type 1 diabetes mellitus with hypoglycemia without coma: Secondary | ICD-10-CM | POA: Diagnosis present

## 2020-12-12 DIAGNOSIS — Z8701 Personal history of pneumonia (recurrent): Secondary | ICD-10-CM

## 2020-12-12 DIAGNOSIS — F39 Unspecified mood [affective] disorder: Secondary | ICD-10-CM | POA: Diagnosis present

## 2020-12-12 DIAGNOSIS — Z794 Long term (current) use of insulin: Secondary | ICD-10-CM

## 2020-12-12 DIAGNOSIS — O2512 Malnutrition in pregnancy, second trimester: Secondary | ICD-10-CM | POA: Diagnosis not present

## 2020-12-12 DIAGNOSIS — R000 Tachycardia, unspecified: Secondary | ICD-10-CM | POA: Diagnosis not present

## 2020-12-12 DIAGNOSIS — I82621 Acute embolism and thrombosis of deep veins of right upper extremity: Secondary | ICD-10-CM | POA: Diagnosis present

## 2020-12-12 DIAGNOSIS — O210 Mild hyperemesis gravidarum: Secondary | ICD-10-CM | POA: Diagnosis not present

## 2020-12-12 DIAGNOSIS — Z803 Family history of malignant neoplasm of breast: Secondary | ICD-10-CM

## 2020-12-12 DIAGNOSIS — E8809 Other disorders of plasma-protein metabolism, not elsewhere classified: Secondary | ICD-10-CM | POA: Diagnosis present

## 2020-12-12 DIAGNOSIS — O99282 Endocrine, nutritional and metabolic diseases complicating pregnancy, second trimester: Secondary | ICD-10-CM | POA: Diagnosis present

## 2020-12-12 DIAGNOSIS — I429 Cardiomyopathy, unspecified: Secondary | ICD-10-CM | POA: Diagnosis present

## 2020-12-12 DIAGNOSIS — E43 Unspecified severe protein-calorie malnutrition: Secondary | ICD-10-CM | POA: Diagnosis present

## 2020-12-12 DIAGNOSIS — I959 Hypotension, unspecified: Secondary | ICD-10-CM | POA: Diagnosis present

## 2020-12-12 DIAGNOSIS — E872 Acidosis: Secondary | ICD-10-CM | POA: Diagnosis present

## 2020-12-12 DIAGNOSIS — O24011 Pre-existing diabetes mellitus, type 1, in pregnancy, first trimester: Secondary | ICD-10-CM | POA: Diagnosis not present

## 2020-12-12 DIAGNOSIS — O99012 Anemia complicating pregnancy, second trimester: Secondary | ICD-10-CM | POA: Diagnosis present

## 2020-12-12 DIAGNOSIS — X58XXXA Exposure to other specified factors, initial encounter: Secondary | ICD-10-CM | POA: Diagnosis present

## 2020-12-12 DIAGNOSIS — O99612 Diseases of the digestive system complicating pregnancy, second trimester: Secondary | ICD-10-CM | POA: Diagnosis present

## 2020-12-12 DIAGNOSIS — E1010 Type 1 diabetes mellitus with ketoacidosis without coma: Secondary | ICD-10-CM | POA: Diagnosis present

## 2020-12-12 DIAGNOSIS — O2232 Deep phlebothrombosis in pregnancy, second trimester: Secondary | ICD-10-CM | POA: Diagnosis present

## 2020-12-12 DIAGNOSIS — E1065 Type 1 diabetes mellitus with hyperglycemia: Secondary | ICD-10-CM | POA: Diagnosis present

## 2020-12-12 DIAGNOSIS — O9A212 Injury, poisoning and certain other consequences of external causes complicating pregnancy, second trimester: Secondary | ICD-10-CM | POA: Diagnosis present

## 2020-12-12 DIAGNOSIS — Z79899 Other long term (current) drug therapy: Secondary | ICD-10-CM

## 2020-12-12 DIAGNOSIS — Z7982 Long term (current) use of aspirin: Secondary | ICD-10-CM

## 2020-12-12 DIAGNOSIS — E860 Dehydration: Secondary | ICD-10-CM | POA: Diagnosis present

## 2020-12-12 DIAGNOSIS — O9942 Diseases of the circulatory system complicating childbirth: Secondary | ICD-10-CM | POA: Diagnosis present

## 2020-12-12 DIAGNOSIS — R0902 Hypoxemia: Secondary | ICD-10-CM | POA: Diagnosis present

## 2020-12-12 DIAGNOSIS — Z3A24 24 weeks gestation of pregnancy: Secondary | ICD-10-CM

## 2020-12-12 DIAGNOSIS — R1110 Vomiting, unspecified: Secondary | ICD-10-CM | POA: Diagnosis present

## 2020-12-12 DIAGNOSIS — Z8249 Family history of ischemic heart disease and other diseases of the circulatory system: Secondary | ICD-10-CM

## 2020-12-12 DIAGNOSIS — K2970 Gastritis, unspecified, without bleeding: Secondary | ICD-10-CM | POA: Diagnosis present

## 2020-12-12 DIAGNOSIS — I5022 Chronic systolic (congestive) heart failure: Secondary | ICD-10-CM | POA: Diagnosis present

## 2020-12-12 DIAGNOSIS — O162 Unspecified maternal hypertension, second trimester: Secondary | ICD-10-CM | POA: Diagnosis present

## 2020-12-12 DIAGNOSIS — I110 Hypertensive heart disease with heart failure: Secondary | ICD-10-CM | POA: Diagnosis present

## 2020-12-12 LAB — CBC WITH DIFFERENTIAL/PLATELET
Abs Immature Granulocytes: 0.02 10*3/uL (ref 0.00–0.07)
Basophils Absolute: 0.1 10*3/uL (ref 0.0–0.1)
Basophils Relative: 1 %
Eosinophils Absolute: 0.2 10*3/uL (ref 0.0–0.5)
Eosinophils Relative: 3 %
HCT: 28.9 % — ABNORMAL LOW (ref 36.0–46.0)
Hemoglobin: 9.9 g/dL — ABNORMAL LOW (ref 12.0–15.0)
Immature Granulocytes: 0 %
Lymphocytes Relative: 23 %
Lymphs Abs: 1.7 10*3/uL (ref 0.7–4.0)
MCH: 30.7 pg (ref 26.0–34.0)
MCHC: 34.3 g/dL (ref 30.0–36.0)
MCV: 89.5 fL (ref 80.0–100.0)
Monocytes Absolute: 0.4 10*3/uL (ref 0.1–1.0)
Monocytes Relative: 5 %
Neutro Abs: 4.9 10*3/uL (ref 1.7–7.7)
Neutrophils Relative %: 68 %
Platelets: 171 10*3/uL (ref 150–400)
RBC: 3.23 MIL/uL — ABNORMAL LOW (ref 3.87–5.11)
RDW: 13.2 % (ref 11.5–15.5)
WBC: 7.2 10*3/uL (ref 4.0–10.5)
nRBC: 0 % (ref 0.0–0.2)

## 2020-12-12 LAB — BASIC METABOLIC PANEL
Anion gap: 10 (ref 5–15)
BUN: 6 mg/dL (ref 6–20)
CO2: 16 mmol/L — ABNORMAL LOW (ref 22–32)
Calcium: 7.9 mg/dL — ABNORMAL LOW (ref 8.9–10.3)
Chloride: 109 mmol/L (ref 98–111)
Creatinine, Ser: 1.09 mg/dL — ABNORMAL HIGH (ref 0.44–1.00)
GFR, Estimated: >60 mL/min (ref >60)
Glucose, Bld: 142 mg/dL — ABNORMAL HIGH (ref 70–99)
Potassium: 3.6 mmol/L (ref 3.5–5.1)
Sodium: 135 mmol/L (ref 135–145)

## 2020-12-12 LAB — MAGNESIUM: Magnesium: 1.6 mg/dL — ABNORMAL LOW (ref 1.7–2.4)

## 2020-12-12 LAB — HCG, QUANTITATIVE, PREGNANCY: hCG, Beta Chain, Quant, S: 68446 m[IU]/mL — ABNORMAL HIGH (ref <5)

## 2020-12-12 MED ORDER — MAGNESIUM SULFATE 2 GM/50ML IV SOLN
2.0000 g | Freq: Once | INTRAVENOUS | Status: AC
  Administered 2020-12-13: 2 g via INTRAVENOUS
  Filled 2020-12-12: qty 50

## 2020-12-12 MED ORDER — SODIUM CHLORIDE 0.9 % IV SOLN
12.5000 mg | Freq: Four times a day (QID) | INTRAVENOUS | Status: DC | PRN
  Administered 2020-12-12: 12.5 mg via INTRAVENOUS
  Filled 2020-12-12: qty 12.5
  Filled 2020-12-12: qty 0.5

## 2020-12-12 MED ORDER — LACTATED RINGERS IV BOLUS
1000.0000 mL | Freq: Once | INTRAVENOUS | Status: AC
  Administered 2020-12-12: 1000 mL via INTRAVENOUS

## 2020-12-12 MED ORDER — ONDANSETRON 4 MG PO TBDP
4.0000 mg | ORAL_TABLET | Freq: Once | ORAL | Status: AC
  Administered 2020-12-12: 4 mg via ORAL

## 2020-12-12 NOTE — ED Provider Notes (Signed)
[facility] Department Provider Note  ____________________________________________   Event Date/Time   First MD Initiated Contact with Patient 12/12/20 2302     (approximate)  I have reviewed the triage vital signs and the nursing notes.   HISTORY  Chief Complaint Emesis    HPI Ashley Camacho is a 30 y.o. female G2, P1 at approximately [redacted] weeks gestation.  She has type 1 diabetes and is followed by Dr. [name] at [facility].  She presents tonight for persistent and uncontrollable vomiting.  She has been ill for a few weeks; she was admitted to [facility] for more than 2 weeks for sepsis due to pneumonia.  After she was discharged she very quickly started having issues or had persistent issues with vomiting.  She went back to [facility] last night for her vomiting and was evaluated and found to be stable and the vomiting seem to have improved so she was discharged.  However, she says that shortly after she was discharged and returned home this morning at some time at 7 AM, she started vomiting again and has been unable to keep down anything.  She has Compazine and Zofran at home and neither one of them seems to work.  She has been monitoring her blood sugar and has not been using her insulin given that she is not able to eat anything.  She has not had any vaginal bleeding.  She has been occasionally having some cramping but no sharp pain in her lower abdomen and part of her evaluation at [facility] within the last 24 hours was for the cramping.  She denies fever, sore throat, chest pain, shortness of breath.  The vomiting is severe and nothing is making it better or worse.     Past Medical History:  Diagnosis Date   Anemia    Diabetes mellitus without complication (HCC)    Gastroparesis    Hypertension    Type 1 diabetes [facility])     Patient Active Problem List   Diagnosis Date Noted   Hyperemesis 12/13/2020   Pneumonia affecting pregnancy 11/24/2020   Diabetes  mellitus affecting pregnancy, second trimester 11/24/2020   Edema 11/24/2020   Decreased urine output 11/24/2020   Shortness of breath    Zinc deficiency    SOB (shortness of breath)    Social problem 11/21/2020   Nausea and vomiting during pregnancy prior to [redacted] weeks gestation 11/21/2020   Low serum vitamin B12    Indication for care in labor and delivery, antepartum 11/20/2020   Anemia affecting pregnancy in second trimester    Absolute anemia    AKI (acute kidney injury) (HCC)    Nausea and vomiting during pregnancy 10/23/2020   Adjustment disorder with mixed anxiety and depressed mood 05/14/2016   DKA (diabetic ketoacidoses) 04/03/2016    Past Surgical History:  Procedure Laterality Date   CESAREAN SECTION     MOUTH SURGERY      Prior to Admission medications   Medication Sig Start Date End Date Taking? Authorizing Provider  aspirin EC 81 MG tablet Take 81 mg by mouth daily. Swallow whole.   Yes [provider]  famotidine (PEPCID) 20 MG tablet Take 20 mg by mouth 2 (two) times daily.   Yes [provider]  ferrous sulfate 324 MG TBEC Take 324 mg by mouth.   Yes [provider]  insulin aspart (NOVOLOG) 100 UNIT/ML injection Inject 0-12 Units into the skin 3 (three) times daily before meals. 11/29/17  Yes Sainani,  Rolly Pancake, MD  ondansetron (ZOFRAN ODT) 4 MG disintegrating tablet Take 1 tablet (4 mg total) by mouth every 8 (eight) hours as needed for nausea or vomiting. 10/23/20  Yes Hildred Laser, MD  Prenatal Vit-Fe Fumarate-FA (PRENATAL MULTIVITAMIN) TABS tablet Take 1 tablet by mouth daily at 12 noon.   Yes [provider]  prochlorperazine (COMPAZINE) 10 MG tablet Take 10 mg by mouth every 6 (six) hours as needed for nausea or vomiting.   Yes [provider]  Doxylamine-Pyridoxine (DICLEGIS PO) Take by mouth as needed (for nausea and vomiting).    [provider]    Allergies Patient has no known allergies.  Family  History  Problem Relation Age of Onset   Diabetes type I Father    CAD Father    CAD Paternal Grandmother    CAD Paternal Grandfather    Breast cancer Mother    Ovarian cancer Neg Hx     Social History Social History   Tobacco Use   Smoking status: Never   Smokeless tobacco: Never  Vaping Use   Vaping Use: Never used  Substance Use Topics   Alcohol use: No   Drug use: No    Review of Systems Constitutional: No fever/chills Eyes: No visual changes. ENT: No sore throat. Cardiovascular: Denies chest pain. Respiratory: Denies shortness of breath. Gastrointestinal: Persistent and intractable nausea and vomiting with an inability to tolerate anything by mouth.  Occasional abdominal cramping. Genitourinary: Negative for dysuria.  Negative for vaginal bleeding. Musculoskeletal: Negative for neck pain.  Negative for back pain. Integumentary: Negative for rash. Neurological: Negative for headaches, focal weakness or numbness.   ____________________________________________   PHYSICAL EXAM:  VITAL SIGNS: ED Triage Vitals [12/12/20 2108]  Enc Vitals Group     BP (!) 86/53     Pulse Rate (!) 113     Resp 18     Temp 98.7 F (37.1 C)     Temp Source Oral     SpO2 97 %     Weight 57 kg (125 lb 10.6 oz)     Height 1.549 m (5\' 1" )     Head Circumference      Peak Flow      Pain Score      Pain Loc      Pain Edu?      Excl. in GC?     Constitutional: Alert and oriented.  Ill-appearing. Eyes: Conjunctivae are normal.  Head: Atraumatic. Nose: No congestion/rhinnorhea. Mouth/Throat: Dry mucous membranes with cracked lips, appears volume depleted. Neck: No stridor.  No meningeal signs.   Cardiovascular: Tachycardia, regular rhythm. Good peripheral circulation. Respiratory: Normal respiratory effort.  No retractions. Gastrointestinal: Soft and nontender. No distention.  Actively vomiting during my assessment. Genitourinary: Deferred Musculoskeletal: No lower extremity  tenderness nor edema. No gross deformities of extremities. Neurologic:  Normal speech and language. No gross focal neurologic deficits are appreciated.  Skin:  Skin is warm, pale, dry and intact. Psychiatric: Mood and affect are normal. Speech and behavior are normal.  ____________________________________________   LABS (all labs ordered are listed, but only abnormal results are displayed)  Labs Reviewed  CBC WITH DIFFERENTIAL/PLATELET - Abnormal; Notable for the following components:      Result Value   RBC 3.23 (*)    Hemoglobin 9.9 (*)    HCT 28.9 (*)    All other components within normal limits  BASIC METABOLIC PANEL - Abnormal; Notable for the following components:   CO2 16 (*)  Glucose, Bld 142 (*)    Creatinine, Ser 1.09 (*)    Calcium 7.9 (*)    All other components within normal limits  HCG, QUANTITATIVE, PREGNANCY - Abnormal; Notable for the following components:   hCG, Beta Chain, Quant, S [id number] (*)    All other components within normal limits  MAGNESIUM - Abnormal; Notable for the following components:   Magnesium 1.6 (*)    All other components within normal limits  GLUCOSE, CAPILLARY - Abnormal; Notable for the following components:   Glucose-Capillary 145 (*)    All other components within normal limits  RESP PANEL BY RT-PCR (FLU A&B, COVID) [id number]  URINALYSIS, ROUTINE W REFLEX MICROSCOPIC  TYPE AND SCREEN   ____________________________________________   PROCEDURES   Procedure(s) performed (including Critical Care):  .1-3 Lead EKG Interpretation  Date/Time: 12/12/2020 11:45 PM Performed by: [name], MD Authorized by: [name], MD     Interpretation: abnormal     ECG rate:  110   ECG rate assessment: tachycardic     Rhythm: sinus tachycardia     Ectopy: none     Conduction: normal     ____________________________________________   INITIAL IMPRESSION / MDM / ASSESSMENT AND PLAN / ED COURSE  As part of my medical decision making,  I reviewed the following data within the electronic MEDICAL RECORD NUMBER Nursing notes reviewed and incorporated, Labs reviewed , Old chart reviewed, and Notes from prior ED visits   Differential diagnosis includes, but is not limited to, hyperemesis, DKA, HHS, electrolyte or metabolic abnormality.  The patient is on the cardiac monitor to evaluate for evidence of arrhythmia and/or significant heart rate changes.  The patient is ill-appearing but nontoxic as result of her persistent vomiting.  She appears volume depleted and was brought back to her room immediately due to hypotension and tachycardia.  She received 1 L of lactated Ringer's and that brought her systolic pressure to upper around 100 but she is still vomiting in spite of 4 mg of Zofran ODT.  She appears volume depleted with dry and cracked mucous membranes and is unable to tolerate anything by mouth.  She has tried Compazine and Zofran at home.  Fortunately, her lab work is generally reassuring with a BMP indicating essentially normal creatinine and BUN and a normal anion gap.  CBC is normal.  Patient is at [redacted] weeks gestation and had an ultrasound I do recently had an evaluation last night that was reassuring.  Magnesium level is 1.6 and I will replete with 2 g of IV magnesium as well as giving another liter of lactated Ringer's.  I consider D5 normal but given that she is a type I diabetic I am concerned that she may quickly become hyperglycemic and possibly lead to additional medical complications.  Given her appearance and inability to control her vomiting, I will speak with Dr. Valentino Saxon who is on-call for [facility].  I believe the patient would benefit from hospitalization for her hyperemesis with intractable nausea and vomiting.      Clinical Course as of 12/13/20 0449  Fri Dec 12, 2020  2331 Placed call to Dr. Valentino Saxon.  Spoke with her nurse, who explained Dr. Valentino Saxon is in a procedure.  Left message, she will call me back. [CF]   Sat Dec 13, 2020  0712 Discussed with Dr. Valentino Saxon who will admit. [CF]    Clinical Course User Index [CF] [name], MD     ____________________________________________  FINAL CLINICAL IMPRESSION(S) / ED DIAGNOSES  Final diagnoses:  Hyperemesis gravidarum  Type 1 diabetes mellitus with other specified complication (HCC)     MEDICATIONS GIVEN DURING THIS VISIT:  Medications  promethazine (PHENERGAN) 12.5 mg in sodium chloride 0.9 % 50 mL IVPB (0 mg Intravenous Stopped 12/12/20 2352)  aspirin EC tablet 81 mg (has no administration in time range)  acetaminophen (TYLENOL) tablet 650 mg (has no administration in time range)  zolpidem (AMBIEN) tablet 5 mg (has no administration in time range)  docusate sodium (COLACE) capsule 100 mg (has no administration in time range)  calcium carbonate (TUMS - dosed in mg elemental calcium) chewable tablet 400 mg of elemental calcium (has no administration in time range)  prenatal multivitamin tablet 1 tablet (has no administration in time range)  enoxaparin (LOVENOX) injection 40 mg (has no administration in time range)  dextrose 5 %-0.9 % sodium chloride infusion ( Intravenous New Bag/Given 12/13/20 0230)  insulin pump (has no administration in time range)  insulin aspart (novoLOG) injection 0-24 Units (has no administration in time range)  ondansetron (ZOFRAN) 8 mg, dexamethasone (DECADRON) 10 mg in sodium chloride 0.9 % 50 mL IVPB (8 mg Intravenous New Bag/Given 12/13/20 0438)  ondansetron (ZOFRAN) 8 mg in sodium chloride 0.9 % 50 mL IVPB (has no administration in time range)  promethazine (PHENERGAN) 25 mg in sodium chloride 0.9 % 50 mL IVPB (has no administration in time range)  hydrOXYzine (VISTARIL) injection 50 mg (has no administration in time range)  ondansetron (ZOFRAN-ODT) disintegrating tablet 4 mg (4 mg Oral Given 12/12/20 2113)  lactated ringers bolus 1,000 mL (1,000 mLs Intravenous Bolus 12/12/20 2337)  magnesium sulfate IVPB 2 g  50 mL (0 g Intravenous Stopped 12/13/20 0145)     ED Discharge Orders     None        Note:  This document was prepared using Dragon voice recognition software and may include unintentional dictation errors.   [name], MD 12/13/20 [phone]

## 2020-12-12 NOTE — ED Triage Notes (Signed)
Pt states she has a new onset of emesis today, pt states she has not been able to keep any food or drink down. Pt is 24 weeks, pt denies abd pain vaginal bleeding or leaking fluids. Pt states she was recently admitted at [facility] for pneumonia, pt has swelling to bilateral legs, was d/c home a few days ago.

## 2020-12-13 ENCOUNTER — Inpatient Hospital Stay: Payer: Self-pay

## 2020-12-13 ENCOUNTER — Encounter: Payer: Self-pay | Admitting: Obstetrics and Gynecology

## 2020-12-13 ENCOUNTER — Inpatient Hospital Stay: Payer: Medicaid Other

## 2020-12-13 DIAGNOSIS — I5041 Acute combined systolic (congestive) and diastolic (congestive) heart failure: Secondary | ICD-10-CM | POA: Diagnosis not present

## 2020-12-13 DIAGNOSIS — E10649 Type 1 diabetes mellitus with hypoglycemia without coma: Secondary | ICD-10-CM | POA: Diagnosis present

## 2020-12-13 DIAGNOSIS — D649 Anemia, unspecified: Secondary | ICD-10-CM | POA: Diagnosis not present

## 2020-12-13 DIAGNOSIS — E1065 Type 1 diabetes mellitus with hyperglycemia: Secondary | ICD-10-CM | POA: Diagnosis present

## 2020-12-13 DIAGNOSIS — O210 Mild hyperemesis gravidarum: Secondary | ICD-10-CM | POA: Diagnosis not present

## 2020-12-13 DIAGNOSIS — O09899 Supervision of other high risk pregnancies, unspecified trimester: Secondary | ICD-10-CM | POA: Diagnosis not present

## 2020-12-13 DIAGNOSIS — O99413 Diseases of the circulatory system complicating pregnancy, third trimester: Secondary | ICD-10-CM | POA: Diagnosis not present

## 2020-12-13 DIAGNOSIS — O99282 Endocrine, nutritional and metabolic diseases complicating pregnancy, second trimester: Secondary | ICD-10-CM

## 2020-12-13 DIAGNOSIS — R0603 Acute respiratory distress: Secondary | ICD-10-CM | POA: Diagnosis not present

## 2020-12-13 DIAGNOSIS — O162 Unspecified maternal hypertension, second trimester: Secondary | ICD-10-CM | POA: Diagnosis present

## 2020-12-13 DIAGNOSIS — O9942 Diseases of the circulatory system complicating childbirth: Secondary | ICD-10-CM | POA: Diagnosis present

## 2020-12-13 DIAGNOSIS — R1110 Vomiting, unspecified: Secondary | ICD-10-CM | POA: Diagnosis present

## 2020-12-13 DIAGNOSIS — E109 Type 1 diabetes mellitus without complications: Secondary | ICD-10-CM | POA: Diagnosis not present

## 2020-12-13 DIAGNOSIS — I5021 Acute systolic (congestive) heart failure: Secondary | ICD-10-CM | POA: Diagnosis not present

## 2020-12-13 DIAGNOSIS — J9621 Acute and chronic respiratory failure with hypoxia: Secondary | ICD-10-CM | POA: Diagnosis not present

## 2020-12-13 DIAGNOSIS — Z3A31 31 weeks gestation of pregnancy: Secondary | ICD-10-CM | POA: Diagnosis not present

## 2020-12-13 DIAGNOSIS — E1010 Type 1 diabetes mellitus with ketoacidosis without coma: Secondary | ICD-10-CM | POA: Diagnosis not present

## 2020-12-13 DIAGNOSIS — K3184 Gastroparesis: Secondary | ICD-10-CM | POA: Diagnosis not present

## 2020-12-13 DIAGNOSIS — R600 Localized edema: Secondary | ICD-10-CM | POA: Diagnosis not present

## 2020-12-13 DIAGNOSIS — Z3A24 24 weeks gestation of pregnancy: Secondary | ICD-10-CM | POA: Diagnosis not present

## 2020-12-13 DIAGNOSIS — O24012 Pre-existing diabetes mellitus, type 1, in pregnancy, second trimester: Secondary | ICD-10-CM | POA: Diagnosis not present

## 2020-12-13 DIAGNOSIS — E1143 Type 2 diabetes mellitus with diabetic autonomic (poly)neuropathy: Secondary | ICD-10-CM | POA: Diagnosis not present

## 2020-12-13 DIAGNOSIS — I429 Cardiomyopathy, unspecified: Secondary | ICD-10-CM | POA: Diagnosis present

## 2020-12-13 DIAGNOSIS — E8809 Other disorders of plasma-protein metabolism, not elsewhere classified: Secondary | ICD-10-CM | POA: Diagnosis present

## 2020-12-13 DIAGNOSIS — I82623 Acute embolism and thrombosis of deep veins of upper extremity, bilateral: Secondary | ICD-10-CM | POA: Diagnosis not present

## 2020-12-13 DIAGNOSIS — R000 Tachycardia, unspecified: Secondary | ICD-10-CM | POA: Diagnosis not present

## 2020-12-13 DIAGNOSIS — O24011 Pre-existing diabetes mellitus, type 1, in pregnancy, first trimester: Secondary | ICD-10-CM | POA: Diagnosis not present

## 2020-12-13 DIAGNOSIS — O212 Late vomiting of pregnancy: Secondary | ICD-10-CM

## 2020-12-13 DIAGNOSIS — J9601 Acute respiratory failure with hypoxia: Secondary | ICD-10-CM | POA: Diagnosis not present

## 2020-12-13 DIAGNOSIS — E46 Unspecified protein-calorie malnutrition: Secondary | ICD-10-CM | POA: Diagnosis not present

## 2020-12-13 DIAGNOSIS — E860 Dehydration: Secondary | ICD-10-CM

## 2020-12-13 DIAGNOSIS — E10319 Type 1 diabetes mellitus with unspecified diabetic retinopathy without macular edema: Secondary | ICD-10-CM | POA: Diagnosis not present

## 2020-12-13 DIAGNOSIS — N179 Acute kidney failure, unspecified: Secondary | ICD-10-CM | POA: Diagnosis not present

## 2020-12-13 DIAGNOSIS — K209 Esophagitis, unspecified without bleeding: Secondary | ICD-10-CM | POA: Diagnosis not present

## 2020-12-13 DIAGNOSIS — Z3A25 25 weeks gestation of pregnancy: Secondary | ICD-10-CM | POA: Diagnosis not present

## 2020-12-13 DIAGNOSIS — N1330 Unspecified hydronephrosis: Secondary | ICD-10-CM | POA: Diagnosis not present

## 2020-12-13 DIAGNOSIS — X58XXXA Exposure to other specified factors, initial encounter: Secondary | ICD-10-CM | POA: Diagnosis present

## 2020-12-13 DIAGNOSIS — I5043 Acute on chronic combined systolic (congestive) and diastolic (congestive) heart failure: Secondary | ICD-10-CM | POA: Diagnosis not present

## 2020-12-13 DIAGNOSIS — Z98891 History of uterine scar from previous surgery: Secondary | ICD-10-CM | POA: Diagnosis not present

## 2020-12-13 DIAGNOSIS — Z3A28 28 weeks gestation of pregnancy: Secondary | ICD-10-CM | POA: Diagnosis not present

## 2020-12-13 DIAGNOSIS — I110 Hypertensive heart disease with heart failure: Secondary | ICD-10-CM | POA: Diagnosis present

## 2020-12-13 DIAGNOSIS — I313 Pericardial effusion (noninflammatory): Secondary | ICD-10-CM | POA: Diagnosis not present

## 2020-12-13 DIAGNOSIS — I82401 Acute embolism and thrombosis of unspecified deep veins of right lower extremity: Secondary | ICD-10-CM | POA: Diagnosis not present

## 2020-12-13 DIAGNOSIS — N99840 Postprocedural hematoma of a genitourinary system organ or structure following a genitourinary system procedure: Secondary | ICD-10-CM | POA: Diagnosis not present

## 2020-12-13 DIAGNOSIS — Z3A33 33 weeks gestation of pregnancy: Secondary | ICD-10-CM | POA: Diagnosis not present

## 2020-12-13 DIAGNOSIS — Z3A27 27 weeks gestation of pregnancy: Secondary | ICD-10-CM | POA: Diagnosis not present

## 2020-12-13 DIAGNOSIS — R0902 Hypoxemia: Secondary | ICD-10-CM

## 2020-12-13 DIAGNOSIS — Z20822 Contact with and (suspected) exposure to covid-19: Secondary | ICD-10-CM | POA: Diagnosis present

## 2020-12-13 DIAGNOSIS — R7989 Other specified abnormal findings of blood chemistry: Secondary | ICD-10-CM | POA: Diagnosis not present

## 2020-12-13 DIAGNOSIS — O99412 Diseases of the circulatory system complicating pregnancy, second trimester: Secondary | ICD-10-CM | POA: Diagnosis present

## 2020-12-13 DIAGNOSIS — L559 Sunburn, unspecified: Secondary | ICD-10-CM

## 2020-12-13 DIAGNOSIS — L7632 Postprocedural hematoma of skin and subcutaneous tissue following other procedure: Secondary | ICD-10-CM | POA: Diagnosis not present

## 2020-12-13 DIAGNOSIS — R0600 Dyspnea, unspecified: Secondary | ICD-10-CM | POA: Diagnosis not present

## 2020-12-13 DIAGNOSIS — I82621 Acute embolism and thrombosis of deep veins of right upper extremity: Secondary | ICD-10-CM | POA: Diagnosis present

## 2020-12-13 DIAGNOSIS — O2232 Deep phlebothrombosis in pregnancy, second trimester: Secondary | ICD-10-CM | POA: Diagnosis present

## 2020-12-13 DIAGNOSIS — O24013 Pre-existing diabetes mellitus, type 1, in pregnancy, third trimester: Secondary | ICD-10-CM | POA: Diagnosis not present

## 2020-12-13 DIAGNOSIS — E43 Unspecified severe protein-calorie malnutrition: Secondary | ICD-10-CM | POA: Diagnosis present

## 2020-12-13 DIAGNOSIS — Z3A29 29 weeks gestation of pregnancy: Secondary | ICD-10-CM | POA: Diagnosis not present

## 2020-12-13 DIAGNOSIS — R748 Abnormal levels of other serum enzymes: Secondary | ICD-10-CM | POA: Diagnosis not present

## 2020-12-13 DIAGNOSIS — I5022 Chronic systolic (congestive) heart failure: Secondary | ICD-10-CM | POA: Diagnosis present

## 2020-12-13 DIAGNOSIS — K920 Hematemesis: Secondary | ICD-10-CM | POA: Diagnosis not present

## 2020-12-13 DIAGNOSIS — E876 Hypokalemia: Secondary | ICD-10-CM | POA: Diagnosis not present

## 2020-12-13 DIAGNOSIS — O99013 Anemia complicating pregnancy, third trimester: Secondary | ICD-10-CM | POA: Diagnosis not present

## 2020-12-13 DIAGNOSIS — O34219 Maternal care for unspecified type scar from previous cesarean delivery: Secondary | ICD-10-CM | POA: Diagnosis not present

## 2020-12-13 DIAGNOSIS — E2740 Unspecified adrenocortical insufficiency: Secondary | ICD-10-CM | POA: Diagnosis not present

## 2020-12-13 DIAGNOSIS — R112 Nausea with vomiting, unspecified: Secondary | ICD-10-CM | POA: Diagnosis not present

## 2020-12-13 DIAGNOSIS — O2512 Malnutrition in pregnancy, second trimester: Secondary | ICD-10-CM | POA: Diagnosis present

## 2020-12-13 DIAGNOSIS — E872 Acidosis: Secondary | ICD-10-CM | POA: Diagnosis not present

## 2020-12-13 DIAGNOSIS — I959 Hypotension, unspecified: Secondary | ICD-10-CM | POA: Diagnosis present

## 2020-12-13 DIAGNOSIS — O0993 Supervision of high risk pregnancy, unspecified, third trimester: Secondary | ICD-10-CM | POA: Diagnosis not present

## 2020-12-13 DIAGNOSIS — F39 Unspecified mood [affective] disorder: Secondary | ICD-10-CM | POA: Diagnosis present

## 2020-12-13 DIAGNOSIS — Z978 Presence of other specified devices: Secondary | ICD-10-CM | POA: Diagnosis not present

## 2020-12-13 DIAGNOSIS — I5032 Chronic diastolic (congestive) heart failure: Secondary | ICD-10-CM | POA: Diagnosis not present

## 2020-12-13 DIAGNOSIS — O99019 Anemia complicating pregnancy, unspecified trimester: Secondary | ICD-10-CM | POA: Diagnosis not present

## 2020-12-13 DIAGNOSIS — I10 Essential (primary) hypertension: Secondary | ICD-10-CM | POA: Diagnosis not present

## 2020-12-13 DIAGNOSIS — R609 Edema, unspecified: Secondary | ICD-10-CM | POA: Diagnosis not present

## 2020-12-13 DIAGNOSIS — R918 Other nonspecific abnormal finding of lung field: Secondary | ICD-10-CM | POA: Diagnosis not present

## 2020-12-13 DIAGNOSIS — O99012 Anemia complicating pregnancy, second trimester: Secondary | ICD-10-CM | POA: Diagnosis present

## 2020-12-13 DIAGNOSIS — O0990 Supervision of high risk pregnancy, unspecified, unspecified trimester: Secondary | ICD-10-CM | POA: Diagnosis not present

## 2020-12-13 DIAGNOSIS — R319 Hematuria, unspecified: Secondary | ICD-10-CM | POA: Diagnosis not present

## 2020-12-13 DIAGNOSIS — I5020 Unspecified systolic (congestive) heart failure: Secondary | ICD-10-CM | POA: Diagnosis not present

## 2020-12-13 LAB — BLOOD GAS, ARTERIAL
Acid-base deficit: 11.5 mmol/L — ABNORMAL HIGH (ref 0.0–2.0)
Bicarbonate: 11.8 mmol/L — ABNORMAL LOW (ref 20.0–28.0)
FIO2: 0.28
O2 Saturation: 96.8 %
Patient temperature: 37
pCO2 arterial: 20 mmHg — ABNORMAL LOW (ref 32.0–48.0)
pH, Arterial: 7.38 (ref 7.350–7.450)
pO2, Arterial: 90 mmHg (ref 83.0–108.0)

## 2020-12-13 LAB — URINALYSIS, COMPLETE (UACMP) WITH MICROSCOPIC
Bilirubin Urine: NEGATIVE
Glucose, UA: NEGATIVE mg/dL
Ketones, ur: 80 mg/dL — AB
Leukocytes,Ua: NEGATIVE
Nitrite: NEGATIVE
Protein, ur: 30 mg/dL — AB
Specific Gravity, Urine: 1.015 (ref 1.005–1.030)
pH: 6 (ref 5.0–8.0)

## 2020-12-13 LAB — CBC WITH DIFFERENTIAL/PLATELET
Abs Immature Granulocytes: 0.02 10*3/uL (ref 0.00–0.07)
Basophils Absolute: 0.1 10*3/uL (ref 0.0–0.1)
Basophils Relative: 1 %
Eosinophils Absolute: 0 10*3/uL (ref 0.0–0.5)
Eosinophils Relative: 0 %
HCT: 27.8 % — ABNORMAL LOW (ref 36.0–46.0)
Hemoglobin: 9.5 g/dL — ABNORMAL LOW (ref 12.0–15.0)
Immature Granulocytes: 0 %
Lymphocytes Relative: 5 %
Lymphs Abs: 0.5 10*3/uL — ABNORMAL LOW (ref 0.7–4.0)
MCH: 30.7 pg (ref 26.0–34.0)
MCHC: 34.2 g/dL (ref 30.0–36.0)
MCV: 90 fL (ref 80.0–100.0)
Monocytes Absolute: 0.2 10*3/uL (ref 0.1–1.0)
Monocytes Relative: 2 %
Neutro Abs: 8.1 10*3/uL — ABNORMAL HIGH (ref 1.7–7.7)
Neutrophils Relative %: 92 %
Platelets: 177 10*3/uL (ref 150–400)
RBC: 3.09 MIL/uL — ABNORMAL LOW (ref 3.87–5.11)
RDW: 13.2 % (ref 11.5–15.5)
WBC: 8.8 10*3/uL (ref 4.0–10.5)
nRBC: 0 % (ref 0.0–0.2)

## 2020-12-13 LAB — COMPREHENSIVE METABOLIC PANEL
ALT: 15 U/L (ref 0–44)
AST: 32 U/L (ref 15–41)
Albumin: 1.8 g/dL — ABNORMAL LOW (ref 3.5–5.0)
Alkaline Phosphatase: 102 U/L (ref 38–126)
Anion gap: 15 (ref 5–15)
BUN: 6 mg/dL (ref 6–20)
CO2: 14 mmol/L — ABNORMAL LOW (ref 22–32)
Calcium: 8 mg/dL — ABNORMAL LOW (ref 8.9–10.3)
Chloride: 108 mmol/L (ref 98–111)
Creatinine, Ser: 1.03 mg/dL — ABNORMAL HIGH (ref 0.44–1.00)
GFR, Estimated: >60 mL/min (ref >60)
Glucose, Bld: 201 mg/dL — ABNORMAL HIGH (ref 70–99)
Potassium: 3.4 mmol/L — ABNORMAL LOW (ref 3.5–5.1)
Sodium: 137 mmol/L (ref 135–145)
Total Bilirubin: 3.3 mg/dL — ABNORMAL HIGH (ref 0.3–1.2)
Total Protein: 5.2 g/dL — ABNORMAL LOW (ref 6.5–8.1)

## 2020-12-13 LAB — BASIC METABOLIC PANEL
Anion gap: 14 (ref 5–15)
Anion gap: 10 (ref 5–15)
Anion gap: 8 (ref 5–15)
BUN: 6 mg/dL (ref 6–20)
BUN: 6 mg/dL (ref 6–20)
BUN: 6 mg/dL (ref 6–20)
CO2: 12 mmol/L — ABNORMAL LOW (ref 22–32)
CO2: 17 mmol/L — ABNORMAL LOW (ref 22–32)
CO2: 19 mmol/L — ABNORMAL LOW (ref 22–32)
Calcium: 8 mg/dL — ABNORMAL LOW (ref 8.9–10.3)
Calcium: 8 mg/dL — ABNORMAL LOW (ref 8.9–10.3)
Calcium: 7.7 mg/dL — ABNORMAL LOW (ref 8.9–10.3)
Chloride: 110 mmol/L (ref 98–111)
Chloride: 109 mmol/L (ref 98–111)
Chloride: 111 mmol/L (ref 98–111)
Creatinine, Ser: 1.23 mg/dL — ABNORMAL HIGH (ref 0.44–1.00)
Creatinine, Ser: 1.29 mg/dL — ABNORMAL HIGH (ref 0.44–1.00)
Creatinine, Ser: 1.25 mg/dL — ABNORMAL HIGH (ref 0.44–1.00)
GFR, Estimated: >60 mL/min (ref >60)
GFR, Estimated: 58 mL/min — ABNORMAL LOW (ref >60)
GFR, Estimated: 60 mL/min — ABNORMAL LOW (ref >60)
Glucose, Bld: 187 mg/dL — ABNORMAL HIGH (ref 70–99)
Glucose, Bld: 145 mg/dL — ABNORMAL HIGH (ref 70–99)
Glucose, Bld: 134 mg/dL — ABNORMAL HIGH (ref 70–99)
Potassium: 3.3 mmol/L — ABNORMAL LOW (ref 3.5–5.1)
Potassium: 3.6 mmol/L (ref 3.5–5.1)
Potassium: 3.4 mmol/L — ABNORMAL LOW (ref 3.5–5.1)
Sodium: 136 mmol/L (ref 135–145)
Sodium: 136 mmol/L (ref 135–145)
Sodium: 138 mmol/L (ref 135–145)

## 2020-12-13 LAB — TYPE AND SCREEN
ABO/RH(D): A POS
Antibody Screen: NEGATIVE

## 2020-12-13 LAB — BETA-HYDROXYBUTYRIC ACID: Beta-Hydroxybutyric Acid: 6.9 mmol/L — ABNORMAL HIGH (ref 0.05–0.27)

## 2020-12-13 LAB — URINALYSIS, ROUTINE W REFLEX MICROSCOPIC
Bilirubin Urine: NEGATIVE
Glucose, UA: NEGATIVE mg/dL
Ketones, ur: 80 mg/dL — AB
Nitrite: NEGATIVE
Protein, ur: 100 mg/dL — AB
Specific Gravity, Urine: 1.012 (ref 1.005–1.030)
pH: 5 (ref 5.0–8.0)

## 2020-12-13 LAB — RESP PANEL BY RT-PCR (FLU A&B, COVID) ARPGX2
Influenza A by PCR: NEGATIVE
Influenza B by PCR: NEGATIVE
SARS Coronavirus 2 by RT PCR: NEGATIVE

## 2020-12-13 LAB — GLUCOSE, CAPILLARY
Glucose-Capillary: 145 mg/dL — ABNORMAL HIGH (ref 70–99)
Glucose-Capillary: 197 mg/dL — ABNORMAL HIGH (ref 70–99)
Glucose-Capillary: 161 mg/dL — ABNORMAL HIGH (ref 70–99)
Glucose-Capillary: 152 mg/dL — ABNORMAL HIGH (ref 70–99)
Glucose-Capillary: 147 mg/dL — ABNORMAL HIGH (ref 70–99)
Glucose-Capillary: 133 mg/dL — ABNORMAL HIGH (ref 70–99)
Glucose-Capillary: 133 mg/dL — ABNORMAL HIGH (ref 70–99)
Glucose-Capillary: 142 mg/dL — ABNORMAL HIGH (ref 70–99)
Glucose-Capillary: 132 mg/dL — ABNORMAL HIGH (ref 70–99)
Glucose-Capillary: 135 mg/dL — ABNORMAL HIGH (ref 70–99)
Glucose-Capillary: 119 mg/dL — ABNORMAL HIGH (ref 70–99)
Glucose-Capillary: 119 mg/dL — ABNORMAL HIGH (ref 70–99)
Glucose-Capillary: 123 mg/dL — ABNORMAL HIGH (ref 70–99)

## 2020-12-13 MED ORDER — INSULIN ASPART 100 UNIT/ML IJ SOLN
0.0000 [IU] | INTRAMUSCULAR | Status: DC
  Administered 2020-12-13: 4 [IU] via SUBCUTANEOUS
  Filled 2020-12-13: qty 1

## 2020-12-13 MED ORDER — LACTATED RINGERS IV BOLUS
1000.0000 mL | Freq: Once | INTRAVENOUS | Status: AC
  Administered 2020-12-13: 1000 mL via INTRAVENOUS

## 2020-12-13 MED ORDER — ACETAMINOPHEN 650 MG RE SUPP
650.0000 mg | RECTAL | Status: DC | PRN
  Administered 2020-12-13 – 2020-12-15 (×3): 650 mg via RECTAL
  Filled 2020-12-13 (×6): qty 1

## 2020-12-13 MED ORDER — ZOLPIDEM TARTRATE 5 MG PO TABS: 5.0000 mg | ORAL_TABLET | Freq: Every evening | ORAL | Status: DC | PRN

## 2020-12-13 MED ORDER — DEXTROSE-NACL 5-0.9 % IV SOLN
INTRAVENOUS | Status: DC
  Filled 2020-12-13 (×2): qty 1000

## 2020-12-13 MED ORDER — PRENATAL MULTIVITAMIN CH
1.0000 | ORAL_TABLET | Freq: Every day | ORAL | Status: DC
  Filled 2020-12-13: qty 1

## 2020-12-13 MED ORDER — FAMOTIDINE IN NACL 20-0.9 MG/50ML-% IV SOLN
20.0000 mg | Freq: Two times a day (BID) | INTRAVENOUS | Status: DC
  Administered 2020-12-13 – 2020-12-16 (×7): 20 mg via INTRAVENOUS
  Filled 2020-12-13 (×7): qty 50

## 2020-12-13 MED ORDER — HYDROXYZINE HCL 50 MG/ML IM SOLN
50.0000 mg | Freq: Four times a day (QID) | INTRAMUSCULAR | Status: DC | PRN
  Administered 2020-12-13: 50 mg via INTRAMUSCULAR
  Filled 2020-12-13 (×2): qty 1

## 2020-12-13 MED ORDER — INSULIN REGULAR(HUMAN) IN NACL 100-0.9 UT/100ML-% IV SOLN
INTRAVENOUS | Status: DC
  Administered 2020-12-13: 1.8 [IU]/h via INTRAVENOUS
  Administered 2020-12-14: 0.8 [IU]/h via INTRAVENOUS
  Filled 2020-12-13 (×2): qty 100

## 2020-12-13 MED ORDER — LACTATED RINGERS IV SOLN: INTRAVENOUS | Status: DC

## 2020-12-13 MED ORDER — CALCIUM CARBONATE ANTACID 500 MG PO CHEW: 2.0000 | CHEWABLE_TABLET | ORAL | Status: DC | PRN

## 2020-12-13 MED ORDER — ENOXAPARIN SODIUM 40 MG/0.4ML IJ SOSY
40.0000 mg | PREFILLED_SYRINGE | INTRAMUSCULAR | Status: DC
  Administered 2020-12-13 – 2020-12-14 (×2): 40 mg via SUBCUTANEOUS
  Filled 2020-12-13 (×2): qty 0.4

## 2020-12-13 MED ORDER — DEXTROSE 50 % IV SOLN
0.0000 mL | INTRAVENOUS | Status: DC | PRN
  Administered 2020-12-14: 40 mL via INTRAVENOUS
  Filled 2020-12-13: qty 50

## 2020-12-13 MED ORDER — DOCUSATE SODIUM 100 MG PO CAPS
100.0000 mg | ORAL_CAPSULE | Freq: Every day | ORAL | Status: DC
  Administered 2020-12-16: 100 mg via ORAL
  Filled 2020-12-13: qty 1

## 2020-12-13 MED ORDER — ALUMINUM-PETROLATUM-ZINC (1-2-3 PASTE) 0.027-13.7-10% PASTE
1.0000 "application " | PASTE | Freq: Three times a day (TID) | CUTANEOUS | Status: DC
  Administered 2020-12-13 – 2020-12-16 (×9): 1 via TOPICAL
  Filled 2020-12-13 (×2): qty 120

## 2020-12-13 MED ORDER — POTASSIUM CHLORIDE 2 MEQ/ML IV SOLN: INTRAVENOUS | Status: DC

## 2020-12-13 MED ORDER — INSULIN PUMP
SUBCUTANEOUS | Status: DC
  Filled 2020-12-13: qty 1

## 2020-12-13 MED ORDER — SODIUM CHLORIDE 0.9 % IV SOLN
8.0000 mg | Freq: Three times a day (TID) | INTRAVENOUS | Status: DC
  Administered 2020-12-13 – 2020-12-15 (×9): 8 mg via INTRAVENOUS
  Filled 2020-12-13 (×13): qty 4

## 2020-12-13 MED ORDER — FUROSEMIDE 10 MG/ML IJ SOLN
20.0000 mg | Freq: Once | INTRAMUSCULAR | Status: AC
  Administered 2020-12-13: 20 mg via INTRAVENOUS
  Filled 2020-12-13: qty 2

## 2020-12-13 MED ORDER — KCL-LACTATED RINGERS-D5W 20 MEQ/L IV SOLN
INTRAVENOUS | Status: DC
  Filled 2020-12-13 (×5): qty 1000

## 2020-12-13 MED ORDER — ACETAMINOPHEN 325 MG PO TABS: 650.0000 mg | ORAL_TABLET | ORAL | Status: DC | PRN

## 2020-12-13 MED ORDER — POTASSIUM CHLORIDE 10 MEQ/100ML IV SOLN
10.0000 meq | INTRAVENOUS | Status: AC
  Filled 2020-12-13: qty 100

## 2020-12-13 MED ORDER — SODIUM CHLORIDE 0.9 % IV SOLN
25.0000 mg | Freq: Four times a day (QID) | INTRAVENOUS | Status: DC
  Administered 2020-12-13 – 2020-12-15 (×10): 25 mg via INTRAVENOUS
  Filled 2020-12-13: qty 1
  Filled 2020-12-13: qty 25
  Filled 2020-12-13 (×14): qty 1

## 2020-12-13 MED ORDER — DEXTROSE IN LACTATED RINGERS 5 % IV SOLN: INTRAVENOUS | Status: DC

## 2020-12-13 MED ORDER — SODIUM CHLORIDE 0.9 % IV SOLN
Freq: Once | INTRAVENOUS | Status: AC
  Administered 2020-12-13: 8 mg via INTRAVENOUS
  Filled 2020-12-13: qty 4

## 2020-12-13 MED ORDER — ASPIRIN EC 81 MG PO TBEC
81.0000 mg | DELAYED_RELEASE_TABLET | Freq: Every day | ORAL | Status: DC
  Filled 2020-12-13: qty 1

## 2020-12-13 MED ORDER — POTASSIUM CHLORIDE 2 MEQ/ML IV SOLN
INTRAVENOUS | Status: DC
  Filled 2020-12-13 (×2): qty 1000

## 2020-12-13 NOTE — ED Notes (Addendum)
RN Valentina Lucks, Diannia Ruder

## 2020-12-13 NOTE — Progress Notes (Signed)
Spoke wit Presenter, broadcasting re PICC order.  States pt has DVT in RUE.  States pt has 2 PIV's working at this time for medication needs. Notified plan on PICC placement in am 12/14/20.

## 2020-12-13 NOTE — H&P (Addendum)
Antepartum History and Physical   Subjective:     Patient ID: Ashley Camacho is a 30 y.o. female [redacted]w[redacted]d, Estimated Date of Delivery: 03/30/21 by by 9 week ultrasound, LMP unknown. She is a Type 1 diabetic who was admitted from the Emergency Room for hyperemesis gravidarum x 2 days.  HD# 0.   Patient has a history of recent admission ~ 3 weeks ago for similar complaints, unable to tolerate PO, admitted for hyperemesis.  She was treated with multiple antiemetics with only modest improvement. During admission patient was developed febrile morbidity with elevated WBC count, and hypoxia.  Concern for multifocal pneumonia (possibly secondary to aspiration) vs pulmonary edema due to fluid overload. Was also on an insulin pump. Patient also received 1 unit PRBCs due to significant anemia of pregnancy, and was received consult from Hematology. She was eventually transferred to [facility]  for further management after ~ 4 day hospitalization at [facility]. For full details of [facility] admission, see discharge summary.     Subjective:  Patient with limited communication, appears fatigued. Answers questions when asked. Is currently on O2 oxygen supplementation.   Patient Active Problem List   Diagnosis Date Noted   Hyperemesis 12/13/2020   Pneumonia affecting pregnancy 11/24/2020   Diabetes mellitus affecting pregnancy, second trimester 11/24/2020   Edema 11/24/2020   Decreased urine output 11/24/2020   Shortness of breath    Zinc deficiency    SOB (shortness of breath)    Social problem 11/21/2020   Nausea and vomiting during pregnancy prior to [redacted] weeks gestation 11/21/2020   Low serum vitamin B12    Indication for care in labor and delivery, antepartum 11/20/2020   Anemia affecting pregnancy in second trimester    Absolute anemia    AKI (acute kidney injury) (HCC)    Nausea and vomiting during pregnancy 10/23/2020   Adjustment disorder with mixed anxiety and depressed mood 05/14/2016   DKA (diabetic  ketoacidoses) 04/03/2016    OB History  Gravida Para Term Preterm AB Living  2 1   1   1   SAB IAB Ectopic Multiple Live Births          1    # Outcome Date GA Lbr Len/2nd Weight Sex Delivery Anes PTL Lv  2 Current           1 Preterm 12/13/10   1361 g F CS-Unspec  Y LIV    Past Medical History:  Diagnosis Date   Anemia    Diabetes mellitus without complication (HCC)    Gastroparesis    Hypertension    Type 1 diabetes (HCC)     Past Surgical History:  Procedure Laterality Date   CESAREAN SECTION     MOUTH SURGERY      Family History  Problem Relation Age of Onset   Diabetes type I Father    CAD Father    CAD Paternal Grandmother    CAD Paternal Grandfather    Breast cancer Mother    Ovarian cancer Neg Hx     Social History   Socioeconomic History   Marital status: Single    Spouse name: Not on file   Number of children: Not on file   Years of education: Not on file   Highest education level: Not on file  Occupational History   Occupation: home maker  Tobacco Use   Smoking status: Never   Smokeless tobacco: Never  Vaping Use   Vaping Use: Never used  Substance and Sexual Activity  Alcohol use: No   Drug use: No   Sexual activity: Yes    Birth control/protection: None  Other Topics Concern   Not on file  Social History Narrative   Not on file   Social Determinants of Health   Financial Resource Strain: Not on file  Food Insecurity: Not on file  Transportation Needs: Not on file  Physical Activity: Not on file  Stress: Not on file  Social Connections: Not on file  Intimate Partner Violence: Not on file    No current facility-administered medications on file prior to encounter.   Current Outpatient Medications on File Prior to Encounter  Medication Sig Dispense Refill   aspirin EC 81 MG tablet Take 81 mg by mouth daily. Swallow whole.     famotidine (PEPCID) 20 MG tablet Take 20 mg by mouth 2 (two) times daily.     ferrous sulfate 324 MG  TBEC Take 324 mg by mouth.     insulin aspart (NOVOLOG) 100 UNIT/ML injection Inject 0-12 Units into the skin 3 (three) times daily before meals. 10 mL 11   ondansetron (ZOFRAN ODT) 4 MG disintegrating tablet Take 1 tablet (4 mg total) by mouth every 8 (eight) hours as needed for nausea or vomiting. 20 tablet 1   Prenatal Vit-Fe Fumarate-FA (PRENATAL MULTIVITAMIN) TABS tablet Take 1 tablet by mouth daily at 12 noon.     prochlorperazine (COMPAZINE) 10 MG tablet Take 10 mg by mouth every 6 (six) hours as needed for nausea or vomiting.     Doxylamine-Pyridoxine (DICLEGIS PO) Take by mouth as needed (for nausea and vomiting).       Review of Systems Denies contractions, leakage of fluids, vaginal bleeding, and reports good fetal movement.     Objective:   Vitals:   12/13/20 0358 12/13/20 0400 12/13/20 0558 12/13/20 0732  BP: (!) 143/88 (!) 143/88 (!) 104/57 138/65  Pulse: (!) 118 (!) 117 (!) 114 (!) 110  Resp:  (!) 22 (!) 25 (!) 28  Temp: 99.3 F (37.4 C) 99.3 F (37.4 C) 99.3 F (37.4 C) 99.6 F (37.6 C)  TempSrc: Oral Oral Oral Oral  SpO2: 97% 95% 94% 98%  Weight:      Height:       I/O last 3 completed shifts: In: 91.8 [IV Piggyback:91.8] Out: 250 [Urine:250] No intake/output data recorded.   PHYSICAL EXAM: General appearance: alert and mild distress Head: Normocephalic, without obvious abnormality, atraumatic Eyes: negative findings: conjunctivae and sclerae normal and pupils equal, round, reactive to light and accomodation Ears: normal external ear canals, bilaterally Nose: Nares normal. Septum midline. Mucosa normal. No drainage or sinus tenderness. Throat: abnormal findings: dry mucus membranes, normal uvula.  Neck: no adenopathy, no carotid bruit, no JVD, supple, symmetrical, trachea midline, and thyroid not enlarged, symmetric, no tenderness/mass/nodules Skin:  pallor, loose skin turgor. Several slightly blistering areas near thighs and at base of feet  bilaterally Neurologic: Grossly normal Lungs:  good inspiratory effort, faint crackles heard at base of lungs bilaterally. No wheezes.  On 1L O2 [place] Heart: regular rate and rhythm, S1, S2 normal, no murmur, click, rub or gallop Abdomen: soft, non-tender; bowel sounds normal; no masses,  no organomegaly Pelvic: deferred Extremities: extremities normal, atraumatic, no cyanosis or edema   FHT: baseline 145 bpm, accels present/absent, decels present/absent.  Variability: moderate Toco: no contractions   Fetus A Non-Stress Test Interpretation for 12/13/20  Indication:  Type 1 DM  Fetal Heart Rate A Mode: Doppler Baseline Rate (A): 156 bpm  Accelerations: not present Decelerations: absent  Toco: Intermittent contractions 3-5 minutes. Not detectable to patient.   Impression: Reactive   Labs:  Results for orders placed or performed during the hospital encounter of 12/12/20  Resp Panel by RT-PCR (Flu A&B, Covid) Nasopharyngeal Swab   Specimen: Nasopharyngeal Swab; Nasopharyngeal(NP) swabs in vial transport medium  Result Value Ref Range   SARS Coronavirus 2 by RT PCR NEGATIVE NEGATIVE   Influenza A by PCR NEGATIVE NEGATIVE   Influenza B by PCR NEGATIVE NEGATIVE  CBC with Differential  Result Value Ref Range   WBC 7.2 4.0 - 10.5 K/uL   RBC 3.23 (L) 3.87 - 5.11 MIL/uL   Hemoglobin 9.9 (L) 12.0 - 15.0 g/dL   HCT 32.928.9 (L) 51.836.0 - 84.146.0 %   MCV 89.5 80.0 - 100.0 fL   MCH 30.7 26.0 - 34.0 pg   MCHC 34.3 30.0 - 36.0 g/dL   RDW 66.013.2 63.011.5 - 16.015.5 %   Platelets 171 150 - 400 K/uL   nRBC 0.0 0.0 - 0.2 %   Neutrophils Relative % 68 %   Neutro Abs 4.9 1.7 - 7.7 K/uL   Lymphocytes Relative 23 %   Lymphs Abs 1.7 0.7 - 4.0 K/uL   Monocytes Relative 5 %   Monocytes Absolute 0.4 0.1 - 1.0 K/uL   Eosinophils Relative 3 %   Eosinophils Absolute 0.2 0.0 - 0.5 K/uL   Basophils Relative 1 %   Basophils Absolute 0.1 0.0 - 0.1 K/uL   Immature Granulocytes 0 %   Abs Immature Granulocytes 0.02 0.00  - 0.07 K/uL  Basic metabolic panel  Result Value Ref Range   Sodium 135 135 - 145 mmol/L   Potassium 3.6 3.5 - 5.1 mmol/L   Chloride 109 98 - 111 mmol/L   CO2 16 (L) 22 - 32 mmol/L   Glucose, Bld 142 (H) 70 - 99 mg/dL   BUN 6 6 - 20 mg/dL   Creatinine, Ser 1.091.09 (H) 0.44 - 1.00 mg/dL   Calcium 7.9 (L) 8.9 - 10.3 mg/dL   GFR, Estimated >32>60 >35>60 mL/min   Anion gap 10 5 - 15  hCG, quantitative, pregnancy  Result Value Ref Range   hCG, Beta Chain, Quant, S 68,446 (H) <5 mIU/mL  Magnesium  Result Value Ref Range   Magnesium 1.6 (L) 1.7 - 2.4 mg/dL  Glucose, capillary  Result Value Ref Range   Glucose-Capillary 145 (H) 70 - 99 mg/dL  Urinalysis, Complete w Microscopic Urine, Clean Catch  Result Value Ref Range   Color, Urine AMBER (A) YELLOW   APPearance CLEAR (A) CLEAR   Specific Gravity, Urine 1.015 1.005 - 1.030   pH 6.0 5.0 - 8.0   Glucose, UA NEGATIVE NEGATIVE mg/dL   Hgb urine dipstick SMALL (A) NEGATIVE   Bilirubin Urine NEGATIVE NEGATIVE   Ketones, ur 80 (A) NEGATIVE mg/dL   Protein, ur 30 (A) NEGATIVE mg/dL   Nitrite NEGATIVE NEGATIVE   Leukocytes,Ua NEGATIVE NEGATIVE   RBC / HPF 6-10 0 - 5 RBC/hpf   WBC, UA 0-5 0 - 5 WBC/hpf   Bacteria, UA RARE (A) NONE SEEN   Squamous Epithelial / LPF 0-5 0 - 5   Mucus PRESENT    Hyaline Casts, UA PRESENT   Glucose, capillary  Result Value Ref Range   Glucose-Capillary 197 (H) 70 - 99 mg/dL  Blood gas, arterial  Result Value Ref Range   FIO2 0.28    Delivery systems NASAL CANNULA    pH, Arterial 7.38  7.350 - 7.450   pCO2 arterial 20 (L) 32.0 - 48.0 mmHg   pO2, Arterial 90 83.0 - 108.0 mmHg   Bicarbonate 11.8 (L) 20.0 - 28.0 mmol/L   Acid-base deficit 11.5 (H) 0.0 - 2.0 mmol/L   O2 Saturation 96.8 %   Patient temperature 37.0    Collection site LEFT RADIAL    Sample type ARTERIAL DRAW    Allens test (pass/fail) PASS PASS  CBC with Differential/Platelet  Result Value Ref Range   WBC 8.8 4.0 - 10.5 K/uL   RBC 3.09 (L) 3.87  - 5.11 MIL/uL   Hemoglobin 9.5 (L) 12.0 - 15.0 g/dL   HCT 40.9 (L) 81.1 - 91.4 %   MCV 90.0 80.0 - 100.0 fL   MCH 30.7 26.0 - 34.0 pg   MCHC 34.2 30.0 - 36.0 g/dL   RDW 78.2 95.6 - 21.3 %   Platelets 177 150 - 400 K/uL   nRBC 0.0 0.0 - 0.2 %   Neutrophils Relative % 92 %   Neutro Abs 8.1 (H) 1.7 - 7.7 K/uL   Lymphocytes Relative 5 %   Lymphs Abs 0.5 (L) 0.7 - 4.0 K/uL   Monocytes Relative 2 %   Monocytes Absolute 0.2 0.1 - 1.0 K/uL   Eosinophils Relative 0 %   Eosinophils Absolute 0.0 0.0 - 0.5 K/uL   Basophils Relative 1 %   Basophils Absolute 0.1 0.0 - 0.1 K/uL   Immature Granulocytes 0 %   Abs Immature Granulocytes 0.02 0.00 - 0.07 K/uL  Comprehensive metabolic panel  Result Value Ref Range   Sodium 137 135 - 145 mmol/L   Potassium 3.4 (L) 3.5 - 5.1 mmol/L   Chloride 108 98 - 111 mmol/L   CO2 14 (L) 22 - 32 mmol/L   Glucose, Bld 201 (H) 70 - 99 mg/dL   BUN 6 6 - 20 mg/dL   Creatinine, Ser 0.86 (H) 0.44 - 1.00 mg/dL   Calcium 8.0 (L) 8.9 - 10.3 mg/dL   Total Protein 5.2 (L) 6.5 - 8.1 g/dL   Albumin 1.8 (L) 3.5 - 5.0 g/dL   AST 32 15 - 41 U/L   ALT 15 0 - 44 U/L   Alkaline Phosphatase 102 38 - 126 U/L   Total Bilirubin 3.3 (H) 0.3 - 1.2 mg/dL   GFR, Estimated >57 >84 mL/min   Anion gap 15 5 - 15  Basic metabolic panel  Result Value Ref Range   Sodium 136 135 - 145 mmol/L   Potassium 3.3 (L) 3.5 - 5.1 mmol/L   Chloride 110 98 - 111 mmol/L   CO2 12 (L) 22 - 32 mmol/L   Glucose, Bld 187 (H) 70 - 99 mg/dL   BUN 6 6 - 20 mg/dL   Creatinine, Ser 6.96 (H) 0.44 - 1.00 mg/dL   Calcium 8.0 (L) 8.9 - 10.3 mg/dL   GFR, Estimated >29 >52 mL/min   Anion gap 14 5 - 15  Type and screen [facility]  Result Value Ref Range   ABO/RH(D) A POS    Antibody Screen NEG    Sample Expiration      12/16/2020,2359 Performed at [facility], [address] [place], [place] [address]     Assessment:  30 y.o. female [redacted]w[redacted]d, with:    1. Hyperemesis  gravidarum   2. Type 1 diabetes mellitus with other specified complication (HCC)   3. Hypoxia   4.  Decreased urine output  5.  Pregnancy at [redacted] weeks  gestation  6. History of C-section x 1  7.  Sunburn  8.  Anemia of pregnancy  9.  DVT of right upper extremity       Plan:   Hyperemesis gravidarum - patient with persistent hyperemesis of pregnancy.  Had improved after last admission, however began noting nausea/vomiting again ~ 2 days ago.  Reports going to the beach last week, also reports sunburn. Likely inciting factor due to dehydration and recent sun exposure. Initiated on rotating Phenergan and Zofran IV, also has Hydroxyzine as breakthrough emetic.  Currently with coffee-ground emesis (only ~ 100 ml since admission). Mostly having retching episodes.  Initiated on IV Pepcid.  Review of chart notes that patient was discharged home on Pepcid after last hospital admission, but she reports that she has not been taking it. Currently NPO. Significantly dehydrated based on current labs.  Has had low urine output overnight, however is slowly improving.  Type 1 diabetes - on D5LR, and will initiate insulin drip until tolerating PO.  Monitor for hypoglycemia. Hospitalist consulted. Patient did receive a dose of Decadron earlier this morning to help with hyperemesis.  Hypoxia - patient with noted hypoxia with onset this morning at approximately 0700, with desaturation to 80s.  Initiated on 2L [place] O2 with improvement to 96%. Per nurse, patient with basal crackles, CXR ordered.  Began weaning down to 1L  at approximately 1000 this morning, is maintaining saturations. Lungs with faint crackles at bases at this time. Despite patient's dehydration, must monitor fluid status carefully as she is at risk for third spacing as she did during previous admission (albumin is low).  To discuss further management with hospitalist if CXR has concerning findings or not able to wean from O2.  Decreased urine output -  likely secondary to hyperemesis and dehydration. No output initially overnight. Report per nurse with bladder scan showing ~ 900 ml, however after foley catheter placement, and dose of Lasix ~ 250 ml drained of light tea-colored urine. Will continue to hydrate. Will continue to monitor.  Pregnancy at [redacted] weeks gestation - NST q shift. Fetal status is overall reassuring.  Does have h/o prior preterm delivery with C-section. No concerns for delivery currently.  Sunburn - patient with h/o recent beach trip. Will treat with zinc oxide cream to prevent blistering. May be inciting event for recent dehydration episode. Anemia of pregnancy - currently stable.  Last admission patient did receive 1 unit PRBCs. Hematology previously consulted during this pregnancy.  Review of [facility] chart mentions "micro-clots" possible DVT of RUE.  Is not currently on any treatment. Will start on Lovenox for prophylaxis, assess if further treatment is needed.   A total of > 60 minutes were spent face-to-face with the patient during the encounter and over half of that time involved counseling and coordination of care.   Hildred Laser, MD [facility]

## 2020-12-13 NOTE — Progress Notes (Signed)
Pt started on 2L oxygen due to decrease in O2 saturation. Pt complains of difficulty catching her breath between episodes of vomiting. Oxygen saturation between 90-94%. Lung sounds are diminished in bilateral bases, no crackles auscultated. O2 saturation increased 94% and held. Pt reports some relief. Placed on continuous O2 monitoring.

## 2020-12-13 NOTE — Progress Notes (Signed)
1237 Pt vomited. After emesis pt O2 sat dropped to 84% on RA, pt encouraged to take deep breaths, O2 remains in th 80's. Pt started on 1L via Nasal Cannula, O2 titrated up to 3L via [place] and O2 sats 93%. Dr Valentino Saxon called and informed of event and informed endo tool started and current insulin rate is 1.92ml/hr.

## 2020-12-13 NOTE — Consult Note (Signed)
[facility] Medical Consultation  Ashley Camacho XBD:[id number] DOB: [birth year] DOA: 12/12/2020 PCP: Hildred Laser, MD   Requesting physician: Dr Valentino Saxon Date of consultation: 12/13/20 Reason for consultation: Diabetic ketoacidosis  Impression/Recommendations Principal Problem:   DKA, type 1, not at goal [facility]) Active Problems:   Hyperemesis   Pregnancy with 24 completed weeks gestation    Diabetic ketoacidosis In a patient with type 1 diabetes mellitus admitted to the obstetrics service for hyperemesis gravidarum.  Patient is [redacted] weeks pregnant and has had frequent hospitalizations for hyperemesis. She was recently discharged from [facility] after treatment for sepsis and pneumonia and prior to that admission had an insulin pump but since then has been on subcutaneous Levemir and NovoLog.  Patient's last hemoglobin A1c was 5.6 Patient's last insulin dose was 1 day prior to her admission because she states she has not been able to eat or drink due to persistent nausea and vomiting. She continues to have emesis and has anion gap of 14 with a serum bicarbonate level of 12.  Beta hydroxybutyric acid is also elevated and this may be secondary to starvation ketosis She has been started on insulin drip and will have serial electrolytes monitored and supplemented. Continue IV Pepcid.    2.  Hyperemesis gravidarum Treatment plan per obstetrics Continue IV fluid resuscitation, antiemetics and IV Pepcid   3.  Cardiomyopathy/chronic systolic heart failure 2D echocardiogram shows an LVEF of 45% Patient has bilateral lower extremity swelling and developed shortness of breath with IV fluid hydration as well as hypoxia that improved following administration of IV diuretics. Continue diuretic therapy as needed    4.  History of right brachial DVT Continue Lovenox 80 mg SQ twice daily per recommendations from [facility]    5.  Severe malnutrition Secondary to poor oral intake from possible  gastroparesis. Patient's albumin level is 1.8 and she has significant bilateral lower extremity edema. Patient was seen and evaluated while at [facility] and nutrition recommended NG tube feeds gastrostomy feeds which she declined. Patient not a candidate for TPN until it is documented that she failed tube feeds which she has not even tried. Remeron 15 mg nightly was recommended at [facility] as an appetite supplement as well as antiemetics. Will benefit from nutrition evaluation during this hospitalization once her acute symptoms resolved.   I will followup again tomorrow. Please contact me if I can be of assistance in the meanwhile. Thank you for this consultation.  Chief Complaint: Nausea/vomiting  HPI:  Patient is a 30 year old female G2,P1 at [redacted] weeks gestation who has a history of type 1 diabetes mellitus.  She presents to the emergency room for evaluation of persistent nausea and vomiting for 2 days.  She was recently treated at [facility] for sepsis secondary to pneumonia and was there for 2 weeks. She initially had an insulin pump but has been switched to subcutaneous Lantus and NovoLog and states that her last dose of insulin was 1 day prior to her admission because she has been unable to tolerate any oral intake. She continues to have emesis of bilious material and sometimes dark material but denies having any abdominal pain or cramping.  She has not had any vaginal bleeding.  She denies having any fever, no sore throat, no chest pain, no shortness of breath, no urinary frequency, no nocturia, no dysuria, no dizziness, no lightheadedness. Labs show pH 7.38/20/90/11.8/96 Sodium 136, potassium 3.3, chloride 110, bicarb 12, glucose 187, BUN 6, creatinine 1.23, calcium 8.0, anion gap 14, white  count 8.8, hemoglobin 9.5, hematocrit 27.8, MCV 90.0, RDW 13.2, platelet count 177 Urinalysis is sterile Chest x-ray reviewed by me shows mild right basilar opacity is noted concerning for  atelectasis and possible associated pleural effusion. Twelve-lead EKG reviewed by me shows sinus tachycardia.  Review of Systems:  As per HPI otherwise all other systems reviewed and negative  Past Medical History:  Diagnosis Date   Anemia    Diabetes mellitus without complication (HCC)    Gastroparesis    Hypertension    Type 1 diabetes (HCC)    Past Surgical History:  Procedure Laterality Date   CESAREAN SECTION     MOUTH SURGERY     Social History:  reports that she has never smoked. She has never used smokeless tobacco. She reports that she does not drink alcohol and does not use drugs.  No Known Allergies Family History  Problem Relation Age of Onset   Diabetes type I Father    CAD Father    CAD Paternal Grandmother    CAD Paternal Grandfather    Breast cancer Mother    Ovarian cancer Neg Hx     Prior to Admission medications   Medication Sig Start Date End Date Taking? Authorizing Provider  aspirin EC 81 MG tablet Take 81 mg by mouth daily. Swallow whole.   Yes [provider]  famotidine (PEPCID) 20 MG tablet Take 20 mg by mouth 2 (two) times daily.   Yes [provider]  ferrous sulfate 324 MG TBEC Take 324 mg by mouth.   Yes [provider]  insulin aspart (NOVOLOG) 100 UNIT/ML injection Inject 0-12 Units into the skin 3 (three) times daily before meals. 11/29/17  Yes Sainani, Rolly Pancake, MD  ondansetron (ZOFRAN ODT) 4 MG disintegrating tablet Take 1 tablet (4 mg total) by mouth every 8 (eight) hours as needed for nausea or vomiting. 10/23/20  Yes Hildred Laser, MD  Prenatal Vit-Fe Fumarate-FA (PRENATAL MULTIVITAMIN) TABS tablet Take 1 tablet by mouth daily at 12 noon.   Yes [provider]  prochlorperazine (COMPAZINE) 10 MG tablet Take 10 mg by mouth every 6 (six) hours as needed for nausea or vomiting.   Yes [provider]  Doxylamine-Pyridoxine (DICLEGIS PO) Take by mouth as needed (for nausea and vomiting).    [provider]   Physical Exam: Blood pressure (!) 123/55, pulse (!) 105, temperature 99 F (37.2 C), temperature source Oral, resp. rate (!) 30, height 5\' 1"  (1.549 m), weight 57 kg, SpO2 94 %. Vitals:   12/13/20 0732 12/13/20 1119  BP: 138/65 (!) 123/55  Pulse: (!) 110 (!) 105  Resp: (!) 28 (!) 30  Temp: 99.6 F (37.6 C) 99 F (37.2 C)  SpO2: 98% 94%    General: Chronically ill-appearing, sitting up in bed with an emesis bag.  Actively vomiting. Eyes: Pale conjunctiva ENT: Within normal limits Neck: Supple, no JVD Cardiovascular: Tachycardic Respiratory: Bilateral air entry, fine crackles at the bases Abdomen: Gravid uterus, bowel sounds present, soft, nontender Skin: Warm and dry Musculoskeletal: 3+ pitting lower extremity edema Psychiatric: Depressed mood, flat affect Neurologic: Generalized weakness  Labs on Admission:  Basic Metabolic Panel: Recent Labs  Lab 12/12/20 2111 12/13/20 0811 12/13/20 1115  NA 135 137 136  K 3.6 3.4* 3.3*  CL 109 108 110  CO2 16* 14* 12*  GLUCOSE 142* 201* 187*  BUN 6 6 6   CREATININE 1.09* 1.03* 1.23*  CALCIUM 7.9* 8.0* 8.0*  MG 1.6*  --   --  Liver Function Tests: Recent Labs  Lab 12/13/20 0811  AST 32  ALT 15  ALKPHOS 102  BILITOT 3.3*  PROT 5.2*  ALBUMIN 1.8*   No results for input(s): LIPASE, AMYLASE in the last 168 hours. No results for input(s): AMMONIA in the last 168 hours. CBC: Recent Labs  Lab 12/12/20 2111 12/13/20 0811  WBC 7.2 8.8  NEUTROABS 4.9 8.1*  HGB 9.9* 9.5*  HCT 28.9* 27.8*  MCV 89.5 90.0  PLT 171 177   Cardiac Enzymes: No results for input(s): CKTOTAL, CKMB, CKMBINDEX, TROPONINI in the last 168 hours. BNP: Invalid input(s): POCBNP CBG: Recent Labs  Lab 12/13/20 0402 12/13/20 0743  GLUCAP 145* 197*    Radiological Exams on Admission: DG Chest Port 1 View  Result Date: 12/13/2020 CLINICAL DATA:  Difficulty breathing. EXAM: PORTABLE CHEST 1 VIEW COMPARISON:  November 24, 2020.  FINDINGS: The heart size and mediastinal contours are within normal limits. Left lung is clear. Mild right basilar opacity is noted concerning for atelectasis and possible associated effusion. No pneumothorax is noted. The visualized skeletal structures are unremarkable. IMPRESSION: Mild right basilar opacity is noted concerning for atelectasis and possible associated pleural effusion. Electronically Signed   By: Lupita Raider M.D.   On: 12/13/2020 11:22    EKG: Independently reviewed.   Time spent: 67  Zayonna Ayuso [facility] Pager [phone]  If 7PM-7AM, please contact night-coverage [email or web address] Password [facility] 12/13/2020, 11:54 AM

## 2020-12-13 NOTE — Progress Notes (Signed)
Toileting offered, pt declined. Stated she feels too weak. Of note, pt also declined toileting at 4 am vitals check.

## 2020-12-13 NOTE — Progress Notes (Signed)
Per Dr. Valentino Saxon, do not treat for BG's under 180. Pt is adjusting to medication and fluids. Notify Dr. Valentino Saxon if any question/concerns overnight. Will see pt at rounds in AM.

## 2020-12-13 NOTE — Progress Notes (Signed)
Paged MD, pt status concerning. Patient appears very weak, does not make effort to sit up or hold emesis bag while vomiting all over herself. Pt vomiting brownish-black watery emesis. Per Dr. Valentino Saxon, is aware of coffee ground emesis, will order Pepcid IV, continue fluids, need to obtain U/A sample when available. Will see patient at rounds in AM. Full linen change x 2 since arrival to the unit. Pt given yaunker attached to suction to aid with clean up. Oral care provided. Gown changed and pt was bathed upon arrival due to copious amounts of dark colored vomit in bed and all over patient.

## 2020-12-13 NOTE — Progress Notes (Signed)
Upon assessment, patient appears to be pale, clammy, tachycardic, and experiencing tachypnea. Pt visibly SOB, with 02 saturations in the mid 90's on 2L [place]. Bladder scan showed greater than . Pt unable to empty bladder. New orders placed to insert foley. Dr. Valentino Saxon notified of 2+ pitting edema in extremities and brown, coffee ground emesis. New orders placed for IV lasix and decrease rate of IV fluids. Pt given sponge bath, gown and sheets changed. Instructed patient to call for any needs.

## 2020-12-14 DIAGNOSIS — E1010 Type 1 diabetes mellitus with ketoacidosis without coma: Secondary | ICD-10-CM | POA: Diagnosis not present

## 2020-12-14 LAB — BASIC METABOLIC PANEL
Anion gap: 4 — ABNORMAL LOW (ref 5–15)
Anion gap: 2 — ABNORMAL LOW (ref 5–15)
BUN: 6 mg/dL (ref 6–20)
BUN: 5 mg/dL — ABNORMAL LOW (ref 6–20)
CO2: 21 mmol/L — ABNORMAL LOW (ref 22–32)
CO2: 23 mmol/L (ref 22–32)
Calcium: 7.5 mg/dL — ABNORMAL LOW (ref 8.9–10.3)
Calcium: 7.5 mg/dL — ABNORMAL LOW (ref 8.9–10.3)
Chloride: 114 mmol/L — ABNORMAL HIGH (ref 98–111)
Chloride: 113 mmol/L — ABNORMAL HIGH (ref 98–111)
Creatinine, Ser: 1.23 mg/dL — ABNORMAL HIGH (ref 0.44–1.00)
Creatinine, Ser: 1.01 mg/dL — ABNORMAL HIGH (ref 0.44–1.00)
GFR, Estimated: >60 mL/min (ref >60)
GFR, Estimated: >60 mL/min (ref >60)
Glucose, Bld: 121 mg/dL — ABNORMAL HIGH (ref 70–99)
Glucose, Bld: 100 mg/dL — ABNORMAL HIGH (ref 70–99)
Potassium: 3.2 mmol/L — ABNORMAL LOW (ref 3.5–5.1)
Potassium: 3.4 mmol/L — ABNORMAL LOW (ref 3.5–5.1)
Sodium: 139 mmol/L (ref 135–145)
Sodium: 138 mmol/L (ref 135–145)

## 2020-12-14 LAB — GLUCOSE, CAPILLARY
Glucose-Capillary: 100 mg/dL — ABNORMAL HIGH (ref 70–99)
Glucose-Capillary: 101 mg/dL — ABNORMAL HIGH (ref 70–99)
Glucose-Capillary: 102 mg/dL — ABNORMAL HIGH (ref 70–99)
Glucose-Capillary: 105 mg/dL — ABNORMAL HIGH (ref 70–99)
Glucose-Capillary: 111 mg/dL — ABNORMAL HIGH (ref 70–99)
Glucose-Capillary: 119 mg/dL — ABNORMAL HIGH (ref 70–99)
Glucose-Capillary: 123 mg/dL — ABNORMAL HIGH (ref 70–99)
Glucose-Capillary: 124 mg/dL — ABNORMAL HIGH (ref 70–99)
Glucose-Capillary: 125 mg/dL — ABNORMAL HIGH (ref 70–99)
Glucose-Capillary: 127 mg/dL — ABNORMAL HIGH (ref 70–99)
Glucose-Capillary: 129 mg/dL — ABNORMAL HIGH (ref 70–99)
Glucose-Capillary: 129 mg/dL — ABNORMAL HIGH (ref 70–99)
Glucose-Capillary: 137 mg/dL — ABNORMAL HIGH (ref 70–99)
Glucose-Capillary: 146 mg/dL — ABNORMAL HIGH (ref 70–99)
Glucose-Capillary: 34 mg/dL — CL (ref 70–99)
Glucose-Capillary: 43 mg/dL — CL (ref 70–99)
Glucose-Capillary: 82 mg/dL (ref 70–99)
Glucose-Capillary: 84 mg/dL (ref 70–99)
Glucose-Capillary: 86 mg/dL (ref 70–99)
Glucose-Capillary: 92 mg/dL (ref 70–99)
Glucose-Capillary: 99 mg/dL (ref 70–99)

## 2020-12-14 MED ORDER — INSULIN ASPART 100 UNIT/ML IJ SOLN
0.0000 [IU] | INTRAMUSCULAR | Status: AC
  Administered 2020-12-15 (×4): 1 [IU] via SUBCUTANEOUS
  Filled 2020-12-14 (×4): qty 1

## 2020-12-14 MED ORDER — ENOXAPARIN SODIUM 80 MG/0.8ML IJ SOSY
80.0000 mg | PREFILLED_SYRINGE | Freq: Two times a day (BID) | INTRAMUSCULAR | Status: DC
  Administered 2020-12-14 – 2020-12-16 (×4): 80 mg via SUBCUTANEOUS
  Filled 2020-12-14 (×4): qty 0.8

## 2020-12-14 MED ORDER — DEXTROSE IN LACTATED RINGERS 5 % IV SOLN: INTRAVENOUS | Status: DC

## 2020-12-14 MED ORDER — KCL-LACTATED RINGERS-D5W 20 MEQ/L IV SOLN
INTRAVENOUS | Status: DC
  Filled 2020-12-14 (×5): qty 1000

## 2020-12-14 MED ORDER — POTASSIUM CHLORIDE 2 MEQ/ML IV SOLN: INTRAVENOUS | Status: DC

## 2020-12-14 MED ORDER — MIRTAZAPINE 15 MG PO TBDP
15.0000 mg | ORAL_TABLET | Freq: Every day | ORAL | Status: DC
  Administered 2020-12-15: 15 mg via ORAL
  Filled 2020-12-14 (×3): qty 1

## 2020-12-14 MED ORDER — FUROSEMIDE 10 MG/ML IJ SOLN
20.0000 mg | Freq: Once | INTRAMUSCULAR | Status: AC
  Administered 2020-12-14: 20 mg via INTRAVENOUS
  Filled 2020-12-14: qty 2

## 2020-12-14 MED ORDER — SODIUM CHLORIDE 0.9% FLUSH
10.0000 mL | Freq: Two times a day (BID) | INTRAVENOUS | Status: DC
  Administered 2020-12-14 – 2020-12-16 (×5): 10 mL

## 2020-12-14 MED ORDER — CHLORHEXIDINE GLUCONATE CLOTH 2 % EX PADS
6.0000 | MEDICATED_PAD | Freq: Every day | CUTANEOUS | Status: DC
  Administered 2020-12-14 – 2020-12-16 (×3): 6 via TOPICAL

## 2020-12-14 MED ORDER — LACTATED RINGERS IV SOLN: INTRAVENOUS | Status: DC

## 2020-12-14 MED ORDER — INSULIN GLARGINE 100 UNIT/ML ~~LOC~~ SOLN
5.0000 [IU] | Freq: Every day | SUBCUTANEOUS | Status: DC
  Administered 2020-12-14: 5 [IU] via SUBCUTANEOUS
  Filled 2020-12-14 (×2): qty 0.05

## 2020-12-14 MED ORDER — PROCHLORPERAZINE EDISYLATE 10 MG/2ML IJ SOLN
10.0000 mg | Freq: Four times a day (QID) | INTRAMUSCULAR | Status: DC | PRN
  Administered 2020-12-15 – 2020-12-16 (×3): 10 mg via INTRAVENOUS
  Filled 2020-12-14 (×5): qty 2

## 2020-12-14 MED ORDER — DEXTROSE 50 % IV SOLN
INTRAVENOUS | Status: AC
  Administered 2020-12-14: 30 mL via INTRAVENOUS
  Filled 2020-12-14: qty 50

## 2020-12-14 MED ORDER — SODIUM CHLORIDE 0.9% FLUSH
10.0000 mL | INTRAVENOUS | Status: DC | PRN
  Administered 2020-12-15: 10 mL

## 2020-12-14 MED ORDER — POTASSIUM CHLORIDE 10 MEQ/100ML IV SOLN
10.0000 meq | INTRAVENOUS | Status: AC
  Administered 2020-12-14 (×2): 10 meq via INTRAVENOUS
  Filled 2020-12-14 (×2): qty 100

## 2020-12-14 MED ORDER — INSULIN REGULAR(HUMAN) IN NACL 100-0.9 UT/100ML-% IV SOLN
INTRAVENOUS | Status: DC
  Filled 2020-12-14 (×2): qty 100

## 2020-12-14 NOTE — Progress Notes (Signed)
Hypoglycemic Event  CBG: 43   Treatment: D50 65mL per Endotool recommendation.   Symptoms: None  Follow-up CBG: Time:1709 CBG Result:111  Possible Reasons for Event: Unknown  Comments/MD notified: Cherry,MD notified and aware. RN to continue using Endotool recommendations. IV insulin rate is not currently infusing.    Shirlean Mylar A Sophya Vanblarcom  CBG (last 3)  Recent Labs    12/14/20 1436 12/14/20 1649 12/14/20 1709  GLUCAP 82 34* 111*

## 2020-12-14 NOTE — Progress Notes (Signed)
Pt ambulated from bed to recliner in the room. RN weaned pt off oxygen and pt sitting comfortably in recliner on room air.  Pt sat in the recliner from 1100-1323. Pt brushed her teeth while sitting up and took a nap. At 1323 pt ambulated 267ft (around the unit) and tolerated the mobility well. O2 saturations maintained between 92%-96% on room air. Pt returned to bed at 1338.

## 2020-12-14 NOTE — Progress Notes (Signed)
  Hypoglycemic Event  CBG: 43  Treatment: Dextrose 50% solution- 78mL  Symptoms: None  Follow-up CBG: Time:1436 CBG Result:82  Possible Reasons for Event: Vomiting, Inadequate meal intake, and Change in activity  Comments/MD notified:RN notified Fran Lowes, MD 12/14/2020 @1420  who was currently at bedside and Cherry,MD 12/14/2020 @1445    Karen Kinnard A Rihana Kiddy   CRITICAL VALUE: CBG 43  RN notified Lai, MD 12/14/2020 @1420  who was currently at bedside and Cherry,MD 12/14/2020 @1445   RESPONSE: RN administered 46mL of Dextrose 50% solution, rechecked CBG 15 min after administering dextrose. Repeat CBG 82.   CBG (last 3)  Recent Labs    12/14/20 1155 12/14/20 1411 12/14/20 1436  GLUCAP 100* 43* 82

## 2020-12-14 NOTE — Progress Notes (Signed)
PROGRESS NOTE    Ashley Camacho  MHD:[id number] DOB: 1991-03-18 DOA: 12/12/2020 PCP: Hildred Laser, MD  OBS1/OBS1   Assessment & Plan:   Principal Problem:   DKA, type 1, not at goal [facility]) Active Problems:   Anemia affecting pregnancy in second trimester   Hyperemesis   Pregnancy with 24 completed weeks gestation   Sunburn   Patient is a 30 year old female G2,P1 at [redacted] weeks gestation who has a history of type 1 diabetes mellitus.  She presents to the emergency room for evaluation of persistent nausea and vomiting for 2 days.  She was recently treated at [facility] for sepsis secondary to pneumonia and was there for 2 weeks. She initially had an insulin pump but has been switched to subcutaneous Lantus and NovoLog and states that her last dose of insulin was 1 day prior to her admission because she has been unable to tolerate any oral intake.   # Diabetic ketoacidosis vs starvation ketoacidosis --anion gap of 14 with a serum bicarbonate level of 12.  Beta hydroxybutyric acid is also elevated.  However, no acidosis per ABG. --started on insulin gtt with endo-tool Plan: --transition off insulin gtt and to subQ insulin --treat starvation  # DM1 # Hypoglycemia --transitioned off insulin gtt to 5u Lantus today, however, has been having hypoglycemic episodes throughout the day. Plan: --Hold long-acting insulin --q4h BG checks --cont D5@100  --oral intake as tolerated  # Hyperemesis gravidarum Treatment plan per obstetrics --cont scheduled zofran and phenergan, per Ob --cont IV Pepcid   # Severe malnutrition Secondary to poor oral intake  --Patient's albumin level is 1.8.  Has not had adequate nutrition for at least a month. --did not tolerate NG tube presence (tried at [facility]). Plan: --cont Remeron 15 mg nightly  --Start TPN, pharm consulted --consult nutrition   # Cardiomyopathy chronic systolic heart failure 2D echocardiogram shows an LVEF of 45% Patient  has bilateral lower extremity swelling and developed shortness of breath with IV fluid hydration as well as hypoxia that improved following administration of IV diuretics. Plan: --Pt currently needs D5 gtt --IV lasix PRN for fluid overload --monitor for respiratory status   # History of right brachial DVT --Continue Lovenox 80 mg SQ twice daily per recommendations from [facility]     DVT prophylaxis: QJ:[id number] dose lovenox Code Status: Full code  Family Communication:  Level of care: Labor and Delivery Dispo:   The patient is from: home Anticipated d/c is to: home Anticipated d/c date is: undetermined Patient currently is not medically ready to d/c due to: can't tolerate oral intake   Subjective and Interval History:  Gap closed.  Pt transitioned from insulin gtt to subQ regimen, however, unable to tolerate any oral intake and continued to drop BG throughout the day.    Pt feeling nauseated and unable to eat despite scheduled zofran and phenergan.    Did get up to the chair today.  RN reported not much urine output.   Objective: Vitals:   12/14/20 1815 12/14/20 1816 12/14/20 1817 12/14/20 2045  BP:    (!) 149/81  Pulse: (!) 111 (!) 112 (!) 111 (!) 106  Resp:    19  Temp:    98.6 F (37 C)  TempSrc:    Axillary  SpO2: 91% 90% 90% 97%  Weight:      Height:        Intake/Output Summary (Last 24 hours) at 12/15/2020 0139 Last data filed at 12/15/2020 0030 Gross per 24 hour  Intake 2881.69 ml  Output 1184 ml  Net 1697.69 ml   Filed Weights   12/12/20 2108  Weight: 57 kg    Examination:   Constitutional: AAOx3, appeared uncomfortable HEENT: conjunctivae and lids normal, EOMI CV: No cyanosis.   RESP: normal respiratory effort, on RA Extremities: No effusions, edema in BLE SKIN: warm, dry Neuro: II - XII grossly intact.   Psych: depressed mood and affect.   Foley present.   Data Reviewed: I have personally reviewed following labs and imaging  studies  CBC: Recent Labs  Lab 12/12/20 2111 12/13/20 0811  WBC 7.2 8.8  NEUTROABS 4.9 8.1*  HGB 9.9* 9.5*  HCT 28.9* 27.8*  MCV 89.5 90.0  PLT 171 177   Basic Metabolic Panel: Recent Labs  Lab 12/12/20 2111 12/13/20 0811 12/13/20 1115 12/13/20 1700 12/13/20 2241 12/14/20 0416 12/14/20 1206  NA 135   < > 136 136 138 139 138  K 3.6   < > 3.3* 3.6 3.4* 3.2* 3.4*  CL 109   < > 110 109 111 114* 113*  CO2 16*   < > 12* 17* 19* 21* 23  GLUCOSE 142*   < > 187* 145* 134* 121* 100*  BUN 6   < > 6 6 6 6  5*  CREATININE 1.09*   < > 1.23* 1.29* 1.25* 1.23* 1.01*  CALCIUM 7.9*   < > 8.0* 8.0* 7.7* 7.5* 7.5*  MG 1.6*  --   --   --   --   --   --    < > = values in this interval not displayed.   GFR: Estimated Creatinine Clearance: 62 mL/min (A) (by C-G formula based on SCr of 1.01 mg/dL (H)). Liver Function Tests: Recent Labs  Lab 12/13/20 0811  AST 32  ALT 15  ALKPHOS 102  BILITOT 3.3*  PROT 5.2*  ALBUMIN 1.8*   No results for input(s): LIPASE, AMYLASE in the last 168 hours. No results for input(s): AMMONIA in the last 168 hours. Coagulation Profile: No results for input(s): INR, PROTIME in the last 168 hours. Cardiac Enzymes: No results for input(s): CKTOTAL, CKMB, CKMBINDEX, TROPONINI in the last 168 hours. BNP (last 3 results) No results for input(s): PROBNP in the last 8760 hours. HbA1C: No results for input(s): HGBA1C in the last 72 hours. CBG: Recent Labs  Lab 12/14/20 1808 12/14/20 1939 12/14/20 2042 12/14/20 2148 12/14/20 2338  GLUCAP 84 86 92 102* 101*   Lipid Profile: No results for input(s): CHOL, HDL, LDLCALC, TRIG, CHOLHDL, LDLDIRECT in the last 72 hours. Thyroid Function Tests: No results for input(s): TSH, T4TOTAL, FREET4, T3FREE, THYROIDAB in the last 72 hours. Anemia Panel: No results for input(s): VITAMINB12, FOLATE, FERRITIN, TIBC, IRON, RETICCTPCT in the last 72 hours. Sepsis Labs: No results for input(s): PROCALCITON, LATICACIDVEN in  the last 168 hours.  Recent Results (from the past 240 hour(s))  Resp Panel by RT-PCR (Flu A&B, Covid) Nasopharyngeal Swab     Status: None   Collection Time: 12/13/20  1:48 AM   Specimen: Nasopharyngeal Swab; Nasopharyngeal(NP) swabs in vial transport medium  Result Value Ref Range Status   SARS Coronavirus 2 by RT PCR NEGATIVE NEGATIVE Final    Comment: (NOTE) SARS-CoV-2 target nucleic acids are NOT DETECTED.  The SARS-CoV-2 RNA is generally detectable in upper respiratory specimens during the acute phase of infection. The lowest concentration of SARS-CoV-2 viral copies this assay can detect is 138 copies/mL. A negative result does not preclude SARS-Cov-2 infection and should not  be used as the sole basis for treatment or other patient management decisions. A negative result may occur with  improper specimen collection/handling, submission of specimen other than nasopharyngeal swab, presence of viral mutation(s) within the areas targeted by this assay, and inadequate number of viral copies(<138 copies/mL). A negative result must be combined with clinical observations, patient history, and epidemiological information. The expected result is Negative.  Fact Sheet for Patients:  BloggerCourse.com  Fact Sheet for Healthcare Providers:  SeriousBroker.it  This test is no t yet approved or cleared by the [place] FDA and  has been authorized for detection and/or diagnosis of SARS-CoV-2 by FDA under an Emergency Use Authorization (EUA). This EUA will remain  in effect (meaning this test can be used) for the duration of the COVID-19 declaration under Section 564(b)(1) of the Act, 21 U.S.C.section 360bbb-3(b)(1), unless the authorization is terminated  or revoked sooner.       Influenza A by PCR NEGATIVE NEGATIVE Final   Influenza B by PCR NEGATIVE NEGATIVE Final    Comment: (NOTE) The Xpert Xpress SARS-CoV-2/FLU/RSV plus assay  is intended as an aid in the diagnosis of influenza from Nasopharyngeal swab specimens and should not be used as a sole basis for treatment. Nasal washings and aspirates are unacceptable for Xpert Xpress SARS-CoV-2/FLU/RSV testing.  Fact Sheet for Patients: BloggerCourse.com  Fact Sheet for Healthcare Providers: SeriousBroker.it  This test is not yet approved or cleared by the [place] FDA and has been authorized for detection and/or diagnosis of SARS-CoV-2 by FDA under an Emergency Use Authorization (EUA). This EUA will remain in effect (meaning this test can be used) for the duration of the COVID-19 declaration under Section 564(b)(1) of the Act, 21 U.S.C. section 360bbb-3(b)(1), unless the authorization is terminated or revoked.  Performed at [facility], [address], [place], [place] [address]       Radiology Studies: [facility] Chest [place] 1 View  Result Date: 12/13/2020 CLINICAL DATA:  Difficulty breathing. EXAM: PORTABLE CHEST 1 VIEW COMPARISON:  November 24, 2020. FINDINGS: The heart size and mediastinal contours are within normal limits. Left lung is clear. Mild right basilar opacity is noted concerning for atelectasis and possible associated effusion. No pneumothorax is noted. The visualized skeletal structures are unremarkable. IMPRESSION: Mild right basilar opacity is noted concerning for atelectasis and possible associated pleural effusion. Electronically Signed   By: Lupita Raider M.D.   On: 12/13/2020 11:22   [place] EKG SITE RITE  Result Date: 12/13/2020 If Site Rite image not attached, placement could not be confirmed due to current cardiac rhythm.    Scheduled Meds:  aluminum-petrolatum-zinc  1 application Topical TID   Chlorhexidine Gluconate Cloth  6 each Topical Daily   docusate sodium  100 mg Oral Daily   enoxaparin (LOVENOX) injection  80 mg Subcutaneous Q12H   insulin aspart  0-6 Units Subcutaneous Q4H    mirtazapine  15 mg Oral QHS   prenatal multivitamin  1 tablet Oral Q1200   sodium chloride flush  10-40 mL Intracatheter Q12H   Continuous Infusions:  dextrose 5% lactated ringers with KCl 20 mEq/L 100 mL/hr at 12/14/20 1400   famotidine (PEPCID) IV 20 mg (12/14/20 2240)   ondansetron (ZOFRAN) IV 8 mg (12/14/20 2148)   promethazine (PHENERGAN) injection (IM or IVPB) 25 mg (12/14/20 2044)     LOS: 2 days     Darlin Priestly, MD [facility] If 7PM-7AM, please contact night-coverage 12/15/2020, 1:39 AM

## 2020-12-14 NOTE — Progress Notes (Signed)
Pt ambulated from the bed to the recliner (about 5 feet distance) and tolerated activity well.

## 2020-12-14 NOTE — Progress Notes (Signed)
RN spoke with Cherry,MD. Ashley Camacho is discontinued per Greggory Brandy, MD. Sliding scale insulin orders placed. Cherry,MD to place orders for CBG Q1 hour, treat low blood sugars until Lantus has worn off and TPN is started tomorrow morning. Pt is not tolerating PO fluids or foods, receiving D5LR with Kcl.

## 2020-12-14 NOTE — Progress Notes (Signed)
Peripherally Inserted Central Catheter Placement  The IV Nurse has discussed with the patient and/or persons authorized to consent for the patient, the purpose of this procedure and the potential benefits and risks involved with this procedure.  The benefits include less needle sticks, lab draws from the catheter, and the patient may be discharged home with the catheter. Risks include, but not limited to, infection, bleeding, blood clot (thrombus formation), and puncture of an artery; nerve damage and irregular heartbeat and possibility to perform a PICC exchange if needed/ordered by physician.  Alternatives to this procedure were also discussed.  Bard Power PICC patient education guide, fact sheet on infection prevention and patient information card has been provided to patient /or left at bedside.    PICC Placement Documentation  PICC Double Lumen 12/14/20 PICC Left Basilic 40 cm 0 cm (Active)  Indication for Insertion or Continuance of Line Poor Vasculature-patient has had multiple peripheral attempts or PIVs lasting less than 24 hours 12/14/20 0900  Exposed Catheter (cm) 0 cm 12/14/20 0900  Site Assessment Clean;Dry;Intact 12/14/20 0900  Lumen #1 Status Flushed;Saline locked;Blood return noted 12/14/20 0900  Lumen #2 Status Flushed;Saline locked;Blood return noted 12/14/20 0900  Dressing Type Transparent;Securing device 12/14/20 0900  Dressing Status Clean;Dry;Intact 12/14/20 0900  Antimicrobial disc in place? Yes 12/14/20 0900  Safety Lock Not Applicable 12/14/20 0900  Line Care Connections checked and tightened 12/14/20 0900  Dressing Intervention New dressing 12/14/20 0900  Dressing Change Due 12/21/20 12/14/20 0900       Franne Grip Renee 12/14/2020, 9:23 AM

## 2020-12-14 NOTE — Progress Notes (Signed)
Antepartum History and Physical   Subjective:     Patient ID: Ashley Camacho is a 30 y.o. G58P0101 female [redacted]w[redacted]d, Estimated Date of Delivery: 03/30/21 by by 9 week ultrasound, LMP unknown. She is a Type 1 diabetic who was admitted from the Emergency Room for hyperemesis gravidarum x 2 days, hypoxia, mild sunburn, DVT of right arm.  HD# 1.    Subjective:  Patient reports that she has not had much vomiting, still having significant nausea.  Is still having small amounts of coffee-ground emesis.  Per nurse report, urine output decreased to ~ 10 ml/hr yesterday evening. IVF increased, however after early this morning began noting moderate edema of the legs so IVF was decreased again.  Overall patient has been sleeping. Nurse also notes that patient often with nasal cannula not in nares, O2 sats remain between 92-96%.  Currently on 1L O2.  Continue to wean down.    Patient Active Problem List   Diagnosis Date Noted   Hyperemesis 12/13/2020   Pregnancy with 24 completed weeks gestation 12/13/2020   DKA, type 1, not at goal [facility]) 12/13/2020   Sunburn 12/13/2020   Pneumonia affecting pregnancy 11/24/2020   Diabetes mellitus affecting pregnancy, second trimester 11/24/2020   Edema 11/24/2020   Decreased urine output 11/24/2020   Shortness of breath    Zinc deficiency    SOB (shortness of breath)    Social problem 11/21/2020   Nausea and vomiting during pregnancy prior to [redacted] weeks gestation 11/21/2020   Low serum vitamin B12    Indication for care in labor and delivery, antepartum 11/20/2020   Anemia affecting pregnancy in second trimester    Absolute anemia    AKI (acute kidney injury) (HCC)    Nausea and vomiting during pregnancy 10/23/2020   Adjustment disorder with mixed anxiety and depressed mood 05/14/2016   DKA (diabetic ketoacidoses) 04/03/2016      Review of Systems Denies contractions, leakage of fluids, vaginal bleeding, and reports good fetal movement.     Objective:    Vitals:   12/14/20 0844 12/14/20 0845 12/14/20 0846 12/14/20 0847  BP:      Pulse: (!) 101 (!) 102 (!) 104 (!) 105  Resp:      Temp:      TempSrc:      SpO2: 96% 98% 98% 98%  Weight:      Height:       I/O last 3 completed shifts: In: 2770.2 [I.V.:2030.4; IV Piggyback:739.7] Out: 476 [Urine:476] Total I/O In: 137.4 [I.V.:137.4] Out: 16 [Urine:16]  Emesis - 50 ml of coffee ground liquid over last shift.   PHYSICAL EXAM: General appearance: fatigued, no distress, and flat affect Skin:   Several slightly blistering areas near thighs and at base of feet bilaterally Neurologic: Grossly normal Lungs:  good inspiratory effort, No wheezes.No crackles.  On 1L O2 [place] Heart: regular rate and rhythm, S1, S2 normal, no murmur, click, rub or gallop Abdomen: soft, non-tender; bowel sounds normal; no masses,  no organomegaly Pelvic: deferred Extremities: edema +2 to calves bilaterally, and Homans sign is negative, no sign of DVT. Ted hose in place.    FHT: baseline 145 bpm, accels present/absent, decels present/absent.  Variability: moderate Toco: no contractions   Fetus A Non-Stress Test Interpretation for 12/14/20  Indication:  Type 1 DM  Fetal Heart Rate A Mode: External Baseline Rate (A): 140 bpm (fht) Variability: Minimal Accelerations: 10 x 10 Decelerations: None (none noted)   Toco: Rare contractions. Not detectable to patient.  Impression: Reactive   Labs:  CBC Latest Ref Rng & Units 12/13/2020 12/12/2020 11/24/2020  WBC 4.0 - 10.5 K/uL 8.8 7.2 5.9  Hemoglobin 12.0 - 15.0 g/dL 2.4(P) 8.0(D) 9.8(P)  Hematocrit 36.0 - 46.0 % 27.8(L) 28.9(L) 24.3(L)  Platelets 150 - 400 K/uL 177 171 146(L)    CMP Latest Ref Rng & Units 12/14/2020 12/13/2020 12/13/2020  Glucose 70 - 99 mg/dL 382(N) 053(Z) 767(H)  BUN 6 - 20 mg/dL 6 6 6   Creatinine 0.44 - 1.00 mg/dL ) 4.19(F) 7.90(W)  Sodium 135 - 145 mmol/L 139 138 136  Potassium 3.5 - 5.1 mmol/L 3.2(L) 3.4(L) 3.6  Chloride 98  - 111 mmol/L 114(H) 111 109  CO2 22 - 32 mmol/L 21(L) 19(L) 17(L)  Calcium 8.9 - 10.3 mg/dL 7.5(L) 7.7(L) 8.0(L)  Total Protein 6.5 - 8.1 g/dL - - -  Total Bilirubin 0.3 - 1.2 mg/dL - - -  Alkaline Phos 38 - 126 U/L - - -  AST 15 - 41 U/L - - -  ALT 0 - 44 U/L - - -    Imaging:  4.09(B EKG SITE RITE If Site Rite image not attached, placement could not be confirmed due to  current cardiac rhythm. DG Chest Port 1 View CLINICAL DATA:  Difficulty breathing.  EXAM: PORTABLE CHEST 1 VIEW  COMPARISON:  November 24, 2020.  FINDINGS: The heart size and mediastinal contours are within normal limits. Left lung is clear. Mild right basilar opacity is noted concerning for atelectasis and possible associated effusion. No pneumothorax is noted. The visualized skeletal structures are unremarkable.  IMPRESSION: Mild right basilar opacity is noted concerning for atelectasis and possible associated pleural effusion.  Electronically Signed   By: November 26, 2020 M.D.   On: 12/13/2020 11:22  Assessment:  30 y.o. female [redacted]w[redacted]d, with:    1. Hyperemesis gravidarum   2. Type 1 diabetes mellitus with other specified complication (HCC)   3. Hypoxia   4.  Decreased urine output  5.  Pregnancy at [redacted] weeks gestation  6. History of C-section x 1  7.  Sunburn  8.  Anemia of pregnancy  9.  DVT of right upper extremity       Plan:   Hyperemesis gravidarum - patient with persistent hyperemesis of pregnancy.  Second admission within a month. Continue rotating Phenergan and Zofran IV, Notes vomiting is improving but still dealing with significant nausea.  Currently with decreasing amounts of coffee-ground emesis only ~ 50 ml since last shift. Continue on IV Pepcid.  Currently NPO but will attempt to begin clears later today. Still showing signs of dehydration due to low urine output and mildly elevated creatinine. PICC line placed this morning, can consult Nutritionist to consider possible parenteral supplements  until patient tolerating PO.  Type 1 diabetes - on D5LR, on insulin drip until tolerating PO.  Monitor for hypoglycemia. Hospitalist also on board.  Hypoxia - on 1L [place] O2 however has had several instances overnight where [place] was not in nares and maintained saturations. Can likely continue weaning off 1L [place]. Lung sounds overall normal.  CXR overall not-concerning for significant processes. Decreased urine output - likely secondary to hyperemesis and dehydration. Will continue to hydrate. Despite patient's dehydration, must monitor fluid status carefully as she is at risk for third spacing (albumin is low). If able to receive some parenteral nutrition, may likely also improve patient's oncotic pressure.  Ted hose in place for current edema, will also attempt to have patient ambulate with assistance  and up to chair today. Will continue to monitor.  Pregnancy at [redacted] weeks gestation - Has been receiving NSTs, fetal status is overall reassuring.  Can change to fetal heart tone assessment q shift. Sunburn - patient with h/o recent beach trip. Will treat with zinc oxide cream to prevent blistering. Areas appear to be healing.  Anemia of pregnancy - currently stable.  Last admission patient did receive 1 unit PRBCs. Hematology previously consulted during this pregnancy.  Review of [facility] chart mentions "micro-clots"/DVT of RUE.  Is not currently on any treatment (however review of [facility] Chart notes patient should be on Lovenox 80 mg BID).  Will adjust dosing. Patient with overall flat affect.  Has h/o mood disorder. Not currently on any medications. Review of [facility] Chart notes patient was initiated on Remeron 15 mg nightly.  Will restart.  A total of  30 minutes were spent face-to-face with the patient during the encounter and over half of that time involved counseling and coordination of care.   Hildred Laser, MD [facility]

## 2020-12-14 NOTE — Progress Notes (Signed)
Cherry,MD aware of pt's inadequate urine output throughout the day. Cherry,MD consulted with hospitalist and a one time order was placed for 20mg  of Lasix IV.

## 2020-12-14 NOTE — Progress Notes (Signed)
RN consulted with pharmacy, TPN feedings will not be available until tomorrow morning. Cherry,MD and Lai,MD notified. Valentino Saxon, MD will place orders to restart Endotool.

## 2020-12-14 NOTE — Progress Notes (Signed)
Inpatient Diabetes Program Recommendations  Diabetes Treatment Program Recommendations  ADA Standards of Care  Diabetes in Pregnancy Target Glucose Ranges:  Fasting: 60 - 90 mg/dL Preprandial: 60 - 401 mg/dL 1 hr postprandial: Less than 140mg /dL (from first bite of meal) 2 hr postprandial: Less than 120 mg/dL (from first bite of meal)    Results for ITXEL, WICKARD (MRN [name]) as of 12/14/2020 10:58  Ref. Range 12/14/2020 00:03 12/14/2020 01:10 12/14/2020 01:52 12/14/2020 03:03 12/14/2020 04:12 12/14/2020 05:10 12/14/2020 06:10 12/14/2020 07:22 12/14/2020 08:32 12/14/2020 09:49  Glucose-Capillary Latest Ref Range: 70 - 99 mg/dL 12/16/2020 (H) 403 (H) 474 (H) 124 (H) 119 (H) 129 (H) 137 (H) 146 (H) 123 (H) 105 (H)  Results for KEEARA, FREES (MRN [name]) as of 12/14/2020 10:58  Ref. Range 12/12/2020 21:11  CO2 Latest Ref Range: 22 - 32 mmol/L 16 (L)  Glucose Latest Ref Range: 70 - 99 mg/dL 12/14/2020 (H)  Anion gap Latest Ref Range: 5 - 15  10   Review of Glycemic Control  Diabetes history: DM1 (makes NO insulin; requires basal, correction, and carb coverage insulin); [redacted]W[redacted]D gestation Outpatient Diabetes medications: OmniPod insulin pump with Novolog insulin Current orders for Inpatient glycemic control: IV insulin  Inpatient Diabetes Program Recommendations:    Insulin: At transition from IV to SQ insulin, please consider ordering Lantus 5 units Q24H, CBGs Q4H, Novolog 0-6 units Q4H.  NOTE: Received chat message from Dr. 329 regarding patient. Chart reviewed. Patient has DM1 and was inpatient 11/20/20-11/24/20 and was using an OmniPod insulin pump with Novolog insulin for DM management. During last hospitalization, patient was having issues with hypoglycemia and her total basal insulin at that time was 5.4 units/day. Made recommendations for Lantus 5 units Q24H, CBGs Q4H, and Novolog 0-6 units Q4H for when provider is ready to transition off IV insulin. Communicated with 01/25/21, RN regarding plan to  transition once Dr. Shirlean Mylar orders SQ insulin.  Will follow along while inpatient.  Thanks, Fran Lowes, RN, MSN, CDE Diabetes Coordinator Inpatient Diabetes Program [phone] (Team Pager from 8am to 5pm)

## 2020-12-14 NOTE — Progress Notes (Signed)
Diabetes coordinator- Ulice Dash, RN and Fran Lowes, MD placed orders to discontinue endotool and insulin drip and transition pt to subQ insulin. Regular diet order was placed by Fran Lowes, MD. Pt was only able to tolerate 72mL of chicken broth before spitting up into an emesis bag. Pt unable to tolerate fluids or food by mouth. Dr. Fran Lowes at bedside at 1430 to discuss possibility of TPN.

## 2020-12-14 NOTE — Progress Notes (Signed)
Monitors applied on pt at 2308 12/13/20 to provide NST for provider. Monitors taken off at 2354 12/13/20. This RN and Freight forwarder held [place] in place for this time but were unable to trace for NST period due to fetal size and movement and maternal movement. Pt states "I just can't stay still" and pt also stated that she did not know why she was unable to stay still. [place] captured multiple instances of MHR throughout this period. Very active fetal movement palpated and audible on monitor. FHT overall reassuring for gestational age. No decelerations audible.

## 2020-12-15 DIAGNOSIS — E1010 Type 1 diabetes mellitus with ketoacidosis without coma: Secondary | ICD-10-CM | POA: Diagnosis not present

## 2020-12-15 LAB — GLUCOSE, CAPILLARY
Glucose-Capillary: 138 mg/dL — ABNORMAL HIGH (ref 70–99)
Glucose-Capillary: 179 mg/dL — ABNORMAL HIGH (ref 70–99)
Glucose-Capillary: 159 mg/dL — ABNORMAL HIGH (ref 70–99)
Glucose-Capillary: 185 mg/dL — ABNORMAL HIGH (ref 70–99)
Glucose-Capillary: 191 mg/dL — ABNORMAL HIGH (ref 70–99)
Glucose-Capillary: 192 mg/dL — ABNORMAL HIGH (ref 70–99)
Glucose-Capillary: 178 mg/dL — ABNORMAL HIGH (ref 70–99)
Glucose-Capillary: 170 mg/dL — ABNORMAL HIGH (ref 70–99)
Glucose-Capillary: 171 mg/dL — ABNORMAL HIGH (ref 70–99)
Glucose-Capillary: 140 mg/dL — ABNORMAL HIGH (ref 70–99)
Glucose-Capillary: 137 mg/dL — ABNORMAL HIGH (ref 70–99)
Glucose-Capillary: 120 mg/dL — ABNORMAL HIGH (ref 70–99)

## 2020-12-15 LAB — COMPREHENSIVE METABOLIC PANEL
ALT: 12 U/L (ref 0–44)
AST: 25 U/L (ref 15–41)
Albumin: 1.5 g/dL — ABNORMAL LOW (ref 3.5–5.0)
Alkaline Phosphatase: 78 U/L (ref 38–126)
Anion gap: 4 — ABNORMAL LOW (ref 5–15)
BUN: <5 mg/dL — ABNORMAL LOW (ref 6–20)
CO2: 22 mmol/L (ref 22–32)
Calcium: 7.4 mg/dL — ABNORMAL LOW (ref 8.9–10.3)
Chloride: 113 mmol/L — ABNORMAL HIGH (ref 98–111)
Creatinine, Ser: 1.06 mg/dL — ABNORMAL HIGH (ref 0.44–1.00)
GFR, Estimated: >60 mL/min (ref >60)
Glucose, Bld: 176 mg/dL — ABNORMAL HIGH (ref 70–99)
Potassium: 3.4 mmol/L — ABNORMAL LOW (ref 3.5–5.1)
Sodium: 139 mmol/L (ref 135–145)
Total Bilirubin: 1.4 mg/dL — ABNORMAL HIGH (ref 0.3–1.2)
Total Protein: 4.2 g/dL — ABNORMAL LOW (ref 6.5–8.1)

## 2020-12-15 LAB — DIFFERENTIAL
Abs Immature Granulocytes: 0.03 10*3/uL (ref 0.00–0.07)
Basophils Absolute: 0.1 10*3/uL (ref 0.0–0.1)
Basophils Relative: 1 %
Eosinophils Absolute: 0.4 10*3/uL (ref 0.0–0.5)
Eosinophils Relative: 5 %
Immature Granulocytes: 0 %
Lymphocytes Relative: 15 %
Lymphs Abs: 1.1 10*3/uL (ref 0.7–4.0)
Monocytes Absolute: 0.4 10*3/uL (ref 0.1–1.0)
Monocytes Relative: 6 %
Neutro Abs: 5.5 10*3/uL (ref 1.7–7.7)
Neutrophils Relative %: 73 %

## 2020-12-15 LAB — CBC
HCT: 25.4 % — ABNORMAL LOW (ref 36.0–46.0)
Hemoglobin: 8.6 g/dL — ABNORMAL LOW (ref 12.0–15.0)
MCH: 30.5 pg (ref 26.0–34.0)
MCHC: 33.9 g/dL (ref 30.0–36.0)
MCV: 90.1 fL (ref 80.0–100.0)
Platelets: 168 10*3/uL (ref 150–400)
RBC: 2.82 MIL/uL — ABNORMAL LOW (ref 3.87–5.11)
RDW: 14 % (ref 11.5–15.5)
WBC: 7.5 10*3/uL (ref 4.0–10.5)
nRBC: 0 % (ref 0.0–0.2)

## 2020-12-15 LAB — TRIGLYCERIDES: Triglycerides: 95 mg/dL (ref <150)

## 2020-12-15 LAB — PHOSPHORUS: Phosphorus: 1.9 mg/dL — ABNORMAL LOW (ref 2.5–4.6)

## 2020-12-15 LAB — PREALBUMIN: Prealbumin: <5 mg/dL — ABNORMAL LOW (ref 18–38)

## 2020-12-15 LAB — MAGNESIUM: Magnesium: 1.4 mg/dL — ABNORMAL LOW (ref 1.7–2.4)

## 2020-12-15 LAB — MRSA NEXT GEN BY PCR, NASAL: MRSA by PCR Next Gen: NOT DETECTED

## 2020-12-15 MED ORDER — ALBUMIN HUMAN 25 % IV SOLN
25.0000 g | Freq: Once | INTRAVENOUS | Status: AC
  Administered 2020-12-15: 25 g via INTRAVENOUS
  Filled 2020-12-15 (×2): qty 100

## 2020-12-15 MED ORDER — POTASSIUM PHOSPHATES 15 MMOLE/5ML IV SOLN
15.0000 mmol | Freq: Once | INTRAVENOUS | Status: AC
  Administered 2020-12-15: 15 mmol via INTRAVENOUS
  Filled 2020-12-15: qty 5

## 2020-12-15 MED ORDER — DEXTROSE IN LACTATED RINGERS 5 % IV SOLN: INTRAVENOUS | Status: AC

## 2020-12-15 MED ORDER — INSULIN REGULAR(HUMAN) IN NACL 100-0.9 UT/100ML-% IV SOLN
INTRAVENOUS | Status: DC
  Administered 2020-12-15: 2 [IU]/h via INTRAVENOUS
  Filled 2020-12-15: qty 100

## 2020-12-15 MED ORDER — FUROSEMIDE 10 MG/ML IJ SOLN
40.0000 mg | Freq: Once | INTRAMUSCULAR | Status: AC
  Administered 2020-12-15: 40 mg via INTRAVENOUS
  Filled 2020-12-15: qty 4

## 2020-12-15 MED ORDER — FUROSEMIDE 10 MG/ML IJ SOLN
40.0000 mg | Freq: Once | INTRAMUSCULAR | Status: DC
  Filled 2020-12-15: qty 4

## 2020-12-15 MED ORDER — MAGNESIUM SULFATE 2 GM/50ML IV SOLN
2.0000 g | Freq: Once | INTRAVENOUS | Status: AC
  Administered 2020-12-15: 2 g via INTRAVENOUS
  Filled 2020-12-15: qty 50

## 2020-12-15 MED ORDER — FUROSEMIDE 10 MG/ML IJ SOLN
40.0000 mg | Freq: Once | INTRAMUSCULAR | Status: AC
  Administered 2020-12-16: 40 mg via INTRAVENOUS
  Filled 2020-12-15: qty 4

## 2020-12-15 MED ORDER — KCL-LACTATED RINGERS-D5W 20 MEQ/L IV SOLN
INTRAVENOUS | Status: DC
  Filled 2020-12-15: qty 1000

## 2020-12-15 MED ORDER — POTASSIUM CHLORIDE 10 MEQ/100ML IV SOLN
10.0000 meq | Freq: Once | INTRAVENOUS | Status: AC
  Administered 2020-12-15: 10 meq via INTRAVENOUS
  Filled 2020-12-15: qty 100

## 2020-12-15 MED ORDER — ALBUMIN HUMAN 25 % IV SOLN
12.5000 g | Freq: Once | INTRAVENOUS | Status: DC
  Filled 2020-12-15: qty 50

## 2020-12-15 MED ORDER — CHLORHEXIDINE GLUCONATE CLOTH 2 % EX PADS
6.0000 | MEDICATED_PAD | Freq: Every day | CUTANEOUS | Status: DC
  Administered 2020-12-16: 6 via TOPICAL

## 2020-12-15 MED ORDER — DEXTROSE 50 % IV SOLN: 0.0000 mL | INTRAVENOUS | Status: DC | PRN

## 2020-12-15 MED ORDER — ALBUMIN HUMAN 25 % IV SOLN
25.0000 g | Freq: Once | INTRAVENOUS | Status: AC
  Administered 2020-12-15: 25 g via INTRAVENOUS
  Filled 2020-12-15: qty 100

## 2020-12-15 MED ORDER — CHROMIC CHLORIDE 40 MCG/10ML IV SOLN
INTRAVENOUS | Status: AC
  Filled 2020-12-15: qty 364.8

## 2020-12-15 MED ORDER — SODIUM CHLORIDE 0.9 % IV SOLN
25.0000 mg | Freq: Four times a day (QID) | INTRAVENOUS | Status: DC | PRN
  Administered 2020-12-16: 25 mg via INTRAVENOUS
  Filled 2020-12-15: qty 1

## 2020-12-15 MED ORDER — KCL-LACTATED RINGERS-D5W 20 MEQ/L IV SOLN
INTRAVENOUS | Status: AC
  Filled 2020-12-15 (×3): qty 1000

## 2020-12-15 MED ORDER — ONDANSETRON HCL 4 MG/2ML IJ SOLN
4.0000 mg | Freq: Three times a day (TID) | INTRAMUSCULAR | Status: DC
  Administered 2020-12-16 (×2): 4 mg via INTRAVENOUS
  Filled 2020-12-15 (×2): qty 2

## 2020-12-15 MED ORDER — LACTATED RINGERS IV SOLN: INTRAVENOUS | Status: DC

## 2020-12-15 NOTE — Progress Notes (Signed)
   12/15/20 1104  Provider Notification  Reason for Communication Review Case  Provider Name [facility]  Provider Role Attending physician  Date Provider Notified 12/15/20  Time Provider Notified 1104  Provider response No new orders (Dr. [name] to talk with Dr. Valentino Saxon about sign out and management of the patient)    There was some confusion on who is managing care for this patient. Dr. Valentino Saxon confirmed that she is off care now and to call Dr. [name]. I talked to Dr. [name] to clarify that he is on call for this patient and that Dr. Valentino Saxon confirmed to me that she gave Dr. [name]

## 2020-12-15 NOTE — Progress Notes (Signed)
   12/15/20 0755  Provider Notification  Reason for Communication Review Case  Test performed and critical result BNP, CBG, urine output  Provider Name [facility]  Provider Role Attending physician  Date Provider Notified 12/15/20  Time Provider Notified [phone]  Method of Communication Face to face  Provider response At bedside  Time of Provider Response 0755 (At time of rounds)    I reviewed the patient's history and present illness, treatments, and symptoms together with patient and the night nurse. I asked the patient her perception of what was going on and why she was here. Patient has a flat affect, minimal eye contact, and very soft minimal responses to questions. She states that she "just can't stop throwing up". She said she doesn't know anything about her kidneys or why she's having swelling, or why her diabetes insulin pump was taken out. She states it has been out since she was admitted at [facility] and has not been to see a doctor here in [place] for her type 1 diabetes since she was discharged from [place].   While in the room with the patient, Dr. Valentino Saxon came in the room to round. Dr. Valentino Saxon reviewed with the patient that her kidneys have injury from the diabetes and that she has congestive heart failure as evidenced by the low ejection fraction on echocardiogram at the last admission here at [place] ~3 weeks ago and why she has so much edema and fluid in her lungs also complicated by and secondary to the pneumonia. Dr. Valentino Saxon explained the slow progress and treatment team by her group as well as the hospitalist managing the diabetes. I reviewed the patient's labs with Dr. Valentino Saxon while she was in the room. Dr. Valentino Saxon said pharmacy did not have anyone to make TPN over the weekend, but the TPN coming this morning should help correct the electrolytes. Dr. Valentino Saxon also talked to the patient about her mental health diagnoses and talked to her about the Remeron ordered. I told Dr. Valentino Saxon that  patient refused it last night and I asked the patient if she would be up to take it this morning. She shook her head no and Dr. Valentino Saxon asked the patient if she could talk about it and Pt. States that "she just doesn't feel like she needs it".   I explained to the patient the need to be upright and to move to help move fluid from her lungs. Patient was hesitant to move but after explaining the benefits and the risks of sitting in one place, patient was compliant to move to the chair. She stood by herself with 2 nurses at bedside should she be unstable. Patient was able to walk to chair with non-skid yellow socks on, with [place] standing beside her for safety. All patient's belongings placed beside her with her beside table in front of her, call bell attached to the chair and strict instructions to not get up without assistance per fall injury prevention protocol. Patient is agreeable to plan and compliant.   Dr. Valentino Saxon talked with the patient about the need for her to try eating and to d/c the foley to prevent infection as soon as she starts eating. Patient refusing everything at this point except a couple ice chips. She does not even want to attempt and ice pop.

## 2020-12-15 NOTE — Progress Notes (Signed)
Antepartum Inpatient Note  Subjective:     Patient ID: JESELLE HISER is a 30 y.o. G2P0101 female [redacted]w[redacted]d, Estimated Date of Delivery: 03/30/21 by by 9 week ultrasound, LMP unknown. She is a Type 1 diabetic who was admitted from the Emergency Room for hyperemesis gravidarum x 2 days, hypoxia, mild sunburn, DVT of right arm.  Prior h/o acute CHF, AKI. HD# 2.    Subjective:  Patient denies major complaints. Still not willing to attempt PO intake at this time.  Per nurse, patient refused Remeron dosing for mood disorder.  When questioned on reason, patient stated "I don't feel like I need that".     Patient Active Problem List   Diagnosis Date Noted   Hyperemesis 12/13/2020   Pregnancy with 24 completed weeks gestation 12/13/2020   DKA, type 1, not at goal [facility]) 12/13/2020   Sunburn 12/13/2020   Pneumonia affecting pregnancy 11/24/2020   Diabetes mellitus affecting pregnancy, second trimester 11/24/2020   Edema 11/24/2020   Decreased urine output 11/24/2020   Shortness of breath    Zinc deficiency    SOB (shortness of breath)    Social problem 11/21/2020   Nausea and vomiting during pregnancy prior to [redacted] weeks gestation 11/21/2020   Low serum vitamin B12    Indication for care in labor and delivery, antepartum 11/20/2020   Anemia affecting pregnancy in second trimester    Absolute anemia    AKI (acute kidney injury) (HCC)    Nausea and vomiting during pregnancy 10/23/2020   Adjustment disorder with mixed anxiety and depressed mood 05/14/2016   DKA (diabetic ketoacidoses) 04/03/2016      Review of Systems Denies contractions, leakage of fluids, vaginal bleeding, and reports good fetal movement.     Objective:   Vitals:   12/14/20 1817 12/14/20 2045 12/15/20 0035 12/15/20 0355  BP:  (!) 149/81 (!) 145/64 (!) 143/65  Pulse: (!) 111 (!) 106 (!) 106 (!) 109  Resp:  19 18 18   Temp:  98.6 F (37 C) 98.4 F (36.9 C) 100 F (37.8 C)  TempSrc:  Axillary Axillary Axillary   SpO2: 90% 97% 96% 95%  Weight:      Height:       I/O last 3 completed shifts: In: 4346.1 [I.V.:3430.2; IV Piggyback:915.9] Out: 1462 [Urine:1462] Total I/O In: -  Out: 100 [Urine:100]    PHYSICAL EXAM: General appearance: fatigued, no distress, and flat affect Skin:   Several healing blistering areas near thighs and at base of feet bilaterally Neurologic: Grossly normal Lungs:  good inspiratory effort, No wheezes.No crackles.  Periodically requiring short term oxygen supplementation for Heart: regular rate and rhythm, S1, S2 normal, no murmur, click, rub or gallop Abdomen: soft, non-tender; bowel sounds normal; no masses,  no organomegaly Pelvic: deferred Extremities: edema +2 to +3 to calves bilaterally, and Homans sign is negative, no sign of DVT. Ted hose in place.    FHT: baseline  Toco: no contractions   Fetus A Non-Stress Test Interpretation for 12/15/20  Indication:  Type 1 DM  Fetal Heart Rate A Mode: Doppler Baseline Rate (A): 148 bpm (FHT) Variability: Minimal Accelerations: 10 x 10 Decelerations: None   Toco: Rare contractions. Not detectable to patient.   Impression: Reactive   Labs:  CBC Latest Ref Rng & Units 12/15/2020 12/13/2020 12/12/2020  WBC 4.0 - 10.5 K/uL 7.5 8.8 7.2  Hemoglobin 12.0 - 15.0 g/dL 12/14/2020) 2.5(E) 5.2(D)  Hematocrit 36.0 - 46.0 % 25.4(L) 27.8(L) 28.9(L)  Platelets 150 - 400  K/uL 168 177 171    CMP Latest Ref Rng & Units 12/15/2020 12/14/2020 12/14/2020  Glucose 70 - 99 mg/dL 448(J) 856(D) 149(F)  BUN 6 - 20 mg/dL <0(Y) 5(L) 6  Creatinine 0.44 - 1.00 mg/dL 6.37(C) 5.88(F) 0.27(X)  Sodium 135 - 145 mmol/L 139 138 139  Potassium 3.5 - 5.1 mmol/L 3.4(L) 3.4(L) 3.2(L)  Chloride 98 - 111 mmol/L 113(H) 113(H) 114(H)  CO2 22 - 32 mmol/L 22 23 21(L)  Calcium 8.9 - 10.3 mg/dL 7.4(L) 7.5(L) 7.5(L)  Total Protein 6.5 - 8.1 g/dL 4.2(L) - -  Total Bilirubin 0.3 - 1.2 mg/dL 4.1(O) - -  Alkaline Phos 38 - 126 U/L 78 - -  AST 15 - 41 U/L 25  - -  ALT 0 - 44 U/L 12 - -      ProBNP No results found for: PROBNP   Imaging:  No new imaging  Assessment:  30 y.o. female [redacted]w[redacted]d, with:    1. Hyperemesis gravidarum   2. Type 1 diabetes mellitus with other specified complication (HCC)   3. Hypoxia   4.  Decreased urine output  5.  Pregnancy at [redacted] weeks gestation  6. History of C-section x 1  7.  Sunburn  8.  Anemia of pregnancy  9.  DVT of right upper extremity  10. Congestive heart failure       Plan:   Hyperemesis gravidarum - patient with resolving hyperemesis (however still has heaving episodes with saliva/sputum production).  Will decrease Zofran dosing (currently on 8 mg, down to 4 mg), and change Phenergan from scheduled to prn.  Continue on IV Pepcid for gastritis.  Currently NPO but may also be some by choice. PICC line place, managed by IV team. Pending parenteral nutrition with TPN today. Once feeds begin, can decrease or even discontinue IVF.   Type 1 diabetes - on D5LR, receiving q 4 hour insulin.   Monitor for hypoglycemia. Hospitalist now managing.   Hypoxia - has occasional episodes, but mostly maintaining sats with room air. Had overall normal CXR on admission. Can supplement with [place] when needed.   Decreased urine output - likely secondary to hyperemesis, dehydration, recent h/o CHF and DM with AKI. Will continue to hydrate slowly and decrease or hold if edema occurs. Despite patient's dehydration, must monitor fluid status carefully as she is at risk for third spacing (albumin is low). If able to receive some parenteral nutrition, may likely also improve patient's oncotic pressure.  Ted hose in place for current edema, will also continue to encourage ambulation and up to chair several times per day with assistance. Can treat occasionally with Lasix as needed per Hospitalist.   Pregnancy at [redacted] weeks gestation - Continue q shift assessment of fetal heart tones.   Sunburn - healing well. Continue zinc oxide  prn.   Anemia of pregnancy - has been stable so far, however drop noted today. No evidence of bleeding. May be dilutional effect as patient's edema has worsened.   Continue to monitor.  Hematology previously consulted during this pregnancy, unable to identify other causes. Received 1 unit PRBCs last hospitalization.    Currently with DVT of right arm.  Continue Lovenox 80 mg BID.    Patient with history of mood disorder. Has very flat affect.  Denies being depressed however presentation suggests otherwise.  Also cannot rule out that her hyperemesis and refusal to take PO is not related to a psychiatric phenom.  Patient refused Remeron overnight. May need to consider Psych  consultation again while inpatient.   Congestive Heart Failure - patient was due for outpatient f/ with Cardiology prior to being readmitted.  Last Echo several weeks ago noted EF of 45%. For further management by Hospitalist. May warrant Cardiology consultation again during this admission.   A total of  30 minutes were spent face-to-face with the patient during the encounter and over half of that time involved counseling and coordination of care.   Hildred Laser, MD [facility]

## 2020-12-15 NOTE — Consult Note (Signed)
PHARMACY - TOTAL PARENTERAL NUTRITION CONSULT NOTE   Indication:  inability to tolerate post-pyloric dobhoff and signs of starvation ISO pregnancy  Patient Measurements: Height: 5\' 1"  (154.9 cm) Weight: 57 kg (125 lb 10.6 oz) IBW/kg (Calculated) : 47.8 TPN AdjBW (KG): 57 Body mass index is 23.74 kg/m. Usual Weight: 57kg   Assessment:   Glucose / Insulin: 80-190s/24hrs  on 5u lantus nightly and 2u/24h SSI Electrolytes:  K: 3.4 (replaced) CL: 113 Calcium corrected 9.4 (alb 1.5) Phos 1.9 (replaced) Mg: 1.4 (replaced) Renal: SCr 1.23> 1.06 (stable/improved) Hepatic: AST/ALT WNL; Tbili 3.3>1.4 Intake / Output; MIVF: net positive 3.4L (on MIVF d5LR @100ml /hr) GI Imaging: N/A GI Surgeries / Procedures: N/A  Central access: PICC (7/24) TPN start date: 7/25 PM  Nutritional Goals (per RD recommendation on 7/25): kCal: 1800-2000kcal/d, Protein: 90-100g of protein, Fluid:  1.8-2L Goal TPN rate is 100 mL/hr (provides 91.2 g of protein and 1780.8 kcals per day)  Current Nutrition:  Full liquid >> TPN  Plan:  Start TPN at 40 mL/hr at 1800 (goal 100 mL/hr) increase by thirds to avoid refeed. Electrolytes in TPN: Na 66mEq/L, K 87mEq/L, Ca 63mEq/L, Mg 51mEq/L, and Phos 51mmol/L. Cl:Ac 1:1 Pt K, Mg and Phos low will give 2g MgSO4 x1; IV Kphos 4m (12m K+); 10 meq IV KCL x1. Receiving some K via MIVF until 1800 as well. Add standard MVI and trace elements to TPN She is high risk of refeeding so will add the folic acid and thiamine x 3 days (d1/3d) Per d/w MD and diabetes coordinator will initiate Endotool guided insulin gtt @1800  when TPN starts. Reduce MIVF to 60 mL/hr at 1800. Monitor TPN labs on Mon/Thurs, and PRN  12/15/2020,10:13 AM

## 2020-12-15 NOTE — Progress Notes (Signed)
   12/15/20 1745  Vital Signs  Pulse Rate (!) 115  Oxygen Therapy  SpO2 92 % (after walking to chair)  O2 Device Nasal Cannula  O2 Flow Rate (L/min) 1 L/min  Patient ambulated to chair. After sitting down, patient was breathing 25/min with O2 saturation 91-92. Patient recovered in 4 minutes.

## 2020-12-15 NOTE — Progress Notes (Addendum)
Inpatient Diabetes Program Recommendations    ADA Standards of Care 2022 Diabetes in Pregnancy Target Glucose Ranges:  Fasting: 60 - 90 mg/dL Preprandial: 60 - 633 mg/dL 1 hr postprandial: Less than 140mg /dL (from first bite of meal) 2 hr postprandial: Less than 120 mg/dL (from first bit of meal)  Lab Results  Component Value Date   GLUCAP 192 (H) 12/15/2020   HGBA1C 5.8 (H) 11/21/2020    Review of Glycemic Control Results for Ashley Camacho, Ashley Camacho (MRN [name]) as of 12/15/2020 11:27  Ref. Range 12/15/2020 01:55 12/15/2020 03:39 12/15/2020 05:29 12/15/2020 07:51 12/15/2020 09:28  Glucose-Capillary Latest Ref Range: 70 - 99 mg/dL 12/17/2020 (H) 893 (H) 734 (H) 185 (H) 191 (H)   Diabetes history: DM 1 Outpatient Diabetes medications:  Insulin pump- basal rates at last d/c from [facility] was 0.05 units/hr which equals 1.2 units in 24 hours period, 1 unit drops blood sugar 100 mg/dL, 1 unit per 30 grams CHO Current orders for Inpatient glycemic control:  Novolog 0-6 units q 4 hours  Inpatient Diabetes Program Recommendations:    Note that patient is going to start TPN?  Will Decadron be continued?  Note with hyperemesis and Type 1 DM, the DKA threshold is much lower.  Consider restart of the insulin drip once TPN is hung.  Also probably need to check another beta Hydroxybutyrate? May also consider reaching out to Endocrinologist at [facility]?  Thanks,  [facility], RN, BC-ADM Inpatient Diabetes Coordinator Pager [phone] (8a-5p)

## 2020-12-15 NOTE — Progress Notes (Signed)
Initial Nutrition Assessment  DOCUMENTATION CODES:  Not applicable  INTERVENTION:  TPN per pharmacy Pt is at high risk for refeeding and is already showing signs from d5 infusing. Recommend 100mg  of thiamine and 1mg  of folic acid daily in TPN until labs normalize.  Monitor Mg and phosphorus daily until labs normalize and no longer showing signs of refeeding Daily weights Continue PNV daily Would recommend GI consult if PO intake does not improve for evaluation of PEG or J-tube as pt will likely need nutrition for 1-2 months after discharge. Continue PO diet, encourage intake   NUTRITION DIAGNOSIS:  Inadequate oral intake related to nausea as evidenced by per patient/family report, meal completion < 25%.   GOAL:  Patient will meet greater than or equal to 90% of their needs   MONITOR:  PO intake, I & O's, Labs, Weight trends  REASON FOR ASSESSMENT:  Consult New TPN/TNA  ASSESSMENT:  30 y.o. female in second trimester ([redacted]w[redacted]d) presented to ED with nausea and vomiting x 2 days. Recently admitted and transferred to [facility] with similar presentation. Dx with hyperemesis gravidarum. PMH includes DM type 1, gastroparesis, HTN, CHF (EF 45%), hx of DVT, and anemia.  GA: [redacted]w[redacted]d EDD: 03/30/21 G2P1 Prepregnancy Weight: ~113 lb (51.4kg at 07/01/2020 MD visit) Prepregnancy BMI: normal Expected weight gain at full term: 25-35 lbs  Current weight gain: ~12 lbs. Slightly less than recommended for weeks gestation (~13-19 lbs recommended). Significant edema present   Pt sleeping at the time of assessment. Would not open eyes to name being called or to gentle touch. Breathing pattern did change when name was called - suspect that pt woke but was not in the mood to talk. Pt did allow for a physical exam.   Reviewed chart and pt's medical hx which is complicated. Pt has a hx of type 1 DM with poorly controlled glucose. Followed by [facility] endocrinology and has a hx of brittle sugars and gastroparesis due to  DM. Reglan has been prescribed in the past but pt reported during that admission she did not take it as prescribed. HgbA1c has varied widely in the last year at [facility] (6.7-10.8%). Most recent A1c during last admission was 5.8% on 7/1 indicating that pt is likely experiencing frequent low glucose levels.   OSH on earlier this month, pt was evaluated by psychiatry and reported chronic issues with poor appetite and low energy. Has been on antidepressants in the past, but none at this time.   Cardiology also consulted earlier this month at [facility] after showing signs of heart failure. ECHO during admission showed EF 40-45%. Cardiology recommended diuresis with close monitoring of I&Os.  New consult received for initiation of TPN this admission as pt presented to ED with continued complaints of N/V. Reviewed RD notes from [facility] admission earlier this month and placement of NGT was attempted so TF could be initiated but was unsuccessful and pt declined and refused to re-try placement after initial attempt (unsure if this was bedside or in IR). Discussed TPN with Hospitalist and OBGYN and recommended that dobhoff tube  placement be considered again by IR and placed post-pyloric as it is the preferred route of feeding. Enteral feeds promote weight gain and discourage gut perfusion when compared to TPN. PICC is already in place and providers report pt has refused NGT placement already. Wish to proceed with TPN. Per MD - pt will likely need nutrition support for the next 1-2 months. Pt would benefit from education from her provider on the benefits  of EN feeds versus TPN and would also benefit from a GI consult to determine if J-tube placement is feasible as pt's gut is functioning and feeding tube are not contraindicated during pregnancy.  On exam, pt is extremely edematous with +4 pitting edema to the BLE. Current has IVF infusing at 173mL/h (2.4L/d). Urine appears dark and RN reports volume of urine is low. Albumin low  at 1.5 this AM. Suspect that pt is third spacing fluid and would recommend that albumin infusion be considered and rate of IVF be adjusted. Pt may also benefit from cardiology consult as she was newly diagnosed with CHF during admission earlier this month and was diuresed with lasix during last admission.  Again, would recommend work-up for long-term feeding tube placement prior to being discharged with TPN as there are concerns with glucose management outpatient, PICC line care to prevent infections, and volume overload.   Discussed nutrition intake with RN. States pt is refusing all POs including her medications. Also reports that pt seemed disengaged with her today as well.   Will continue to follow patient and make recommendations for PO supplements once pt is agreeable to intake PO.       Nutritionally Relevant Medications: Scheduled Meds:  docusate sodium  100 mg Oral Daily   insulin aspart  0-6 Units Subcutaneous Q4H   mirtazapine  15 mg Oral QHS   prenatal multivitamin  1 tablet Oral Q1200   Continuous Infusions:  dextrose 5% lactated ringers with KCl 20 mEq/L 100 mL/hr at 12/15/20 0148   famotidine (PEPCID) IV 20 mg (12/14/20 2240)   ondansetron (ZOFRAN) IV 8 mg (12/15/20 0526)   promethazine (PHENERGAN) injection (IM or IVPB) 25 mg (12/15/20 0937)   PRN Meds: calcium carbonate, dextrose, prochlorperazine  Labs Reviewed: K 3.4 BUN <5, creatinine 1.06 Phosphorus 1.9 Mg 1.4 Albumin 1.5 Triglycerides 95 SBG ranges from 34-192 mg/dL over the last 24 hours HgbA1c 5.8% (7/1)  NUTRITION - FOCUSED PHYSICAL EXAM: Flowsheet Row Most Recent Value  Orbital Region No depletion  Upper Arm Region No depletion  Thoracic and Lumbar Region No depletion  Buccal Region No depletion  Temple Region No depletion  Clavicle Bone Region No depletion  Clavicle and Acromion Bone Region No depletion  Scapular Bone Region No depletion  Dorsal Hand No depletion  Patellar Region No depletion   Anterior Thigh Region No depletion  Posterior Calf Region No depletion  Edema (RD Assessment) Severe  [BLE, +4 pitting]  Hair Reviewed  [sparse, thin]  Eyes Unable to assess  Mouth Unable to assess  Skin Reviewed  Nails Reviewed   Diet Order:   Diet Order             Diet Carb Modified Fluid consistency: Thin; Room service appropriate? Yes  Diet effective now                   EDUCATION NEEDS:  Not appropriate for education at this time  Skin:  Skin Assessment: Reviewed RN Assessment  Last BM:  PTA  Height:  Ht Readings from Last 1 Encounters:  12/12/20 5\' 1"  (1.549 m)    Weight:  Wt Readings from Last 1 Encounters:  12/12/20 57 kg    Ideal Body Weight:  47.7 kg  BMI:  Body mass index is 23.74 kg/m.  Estimated Nutritional Needs:  Kcal:  1800-2000 kcal/d Protein:  90-100 g/d Fluid:  1.8-2 L/d  12/14/20, RD, LDN Clinical Dietitian Pager on Amion

## 2020-12-15 NOTE — Progress Notes (Signed)
   12/15/20 1045  Provider Notification  Reason for Communication Review Case  Provider Name Hildred Laser  Provider Role Attending physician  Date Provider Notified 12/15/20  Time Provider Notified 1045  Method of Communication Face to face  Provider response In department  Time of Provider Response 1045    I reviewed entire case with Dr. Valentino Saxon including history and each portion of care what the interventions are to be-diabetes, heart, edema, pulmonary, kidney, and nutrition. Dr. Valentino Saxon was managing care yesterday and that there was some conflicting orders with hospitalist, but that overall, medicine should be managing care related to non-pregnancy related disease processes and OB managing OB care though she was managing both yesterday and last night, from this point, I am to call medicine for medical concerns that arent' pregnancy related. Will relay to Dr. [name] to everyone is on same page.

## 2020-12-15 NOTE — Progress Notes (Signed)
Patient has continual frown, minimal eye contact and very slow to move at all. I sat down with patient before administering lovenox or checking fetal heart tones and asked her if she was just feeling sick and tired or if she was sad. She said in a very low voice "sick and tired" and asked to be moved back to bed to sleep. I assisted her to move to bed, and instructed her again to not attempt to get up without help. Her edema is now +4 in her legs. Pedal pulse still present and capillary refill <3 seconds. Will continue to monitor. Lungs clear but diminished. Pulse ox is better since sitting in the chair. Patient's legs are uncomfortable to touch or to movement as reported by pt. She can also not move up in bed on her own.

## 2020-12-15 NOTE — Progress Notes (Signed)
PROGRESS NOTE    Ashley Camacho  FYB:[id number] DOB: September 07, 1990 DOA: 12/12/2020 PCP: Hildred Laser, MD  OBS1/OBS1   Assessment & Plan:   Principal Problem:   DKA, type 1, not at goal [facility]) Active Problems:   Anemia affecting pregnancy in second trimester   Hyperemesis   Pregnancy with 24 completed weeks gestation   Sunburn   Patient is a 30 year old female G2,P1 at [redacted] weeks gestation who has a history of type 1 diabetes mellitus.  She presents to the emergency room for evaluation of persistent nausea and vomiting for 2 days.  She was recently treated at [facility] for sepsis secondary to pneumonia and was there for 2 weeks. She initially had an insulin pump but has been switched to subcutaneous Lantus and NovoLog and states that her last dose of insulin was 1 day prior to her admission because she has been unable to tolerate any oral intake.   # Diabetic ketoacidosis vs starvation ketoacidosis --anion gap of 14 with a serum bicarbonate level of 12.  Beta hydroxybutyric acid is also elevated.  However, no acidosis per ABG. --started on insulin gtt with endo-tool and transitioned to subQ insulin on 7/24.   Plan: --treat starvation with TPN  # DM1 # Hypoglycemia --transitioned off insulin gtt to 5u Lantus on 7/24, however, has been having hypoglycemic episodes throughout the day. Plan: --Hold long-acting insulin --back on insulin gtt with start of TPN  # Hyperemesis gravidarum Treatment plan per obstetrics --cont scheduled zofran and phenergan, per Ob --cont IV Pepcid   # Severe malnutrition # Starting TPN Secondary to poor oral intake  --Patient's albumin level is 1.8.  Has not had adequate nutrition for at least a month. --did not tolerate NG tube presence (tried at [facility]). Plan: --cont Remeron 15 mg nightly  --start TPN, pharm to manage --monitor for refeeding syndrome --insulin gtt with start of TPN to assess for amount of insulin needs    Hypoalbuminemia  Edema due to 3rd spacing --IV lasix 40 BID today with IV albumin 25 g  # Cardiomyopathy chronic systolic heart failure 2D echocardiogram shows an LVEF of 45% Patient has bilateral lower extremity swelling and developed shortness of breath with IV fluid hydration as well as hypoxia that improved following administration of IV diuretics. Plan: --monitor for respiratory status --IV lasix 40 BID today with IV albumin 25 g   # History of right brachial DVT --Continue Lovenox 80 mg SQ twice daily per recommendations from [facility]     DVT prophylaxis: NI:[id number] dose lovenox Code Status: Full code  Family Communication:  Level of care: Stepdown Dispo:   The patient is from: home Anticipated d/c is to: home Anticipated d/c date is: undetermined Patient currently is not medically ready to d/c due to: can't tolerate oral intake   Subjective and Interval History:  Pt appeared more comfortable today.  Good urine output with IV lasix and IV albumin combo.  Started TPN today.  Pt had been up to chair.  Ob asked hospitalist service to take over as primary since pt's active issues are more Medicine related.  Ob also requested pt to be transferred out of labor/delivery unit.   Objective: Vitals:   12/15/20 1745 12/15/20 1748 12/15/20 1750 12/15/20 1800  BP:      Pulse: (!) 115 (!) 106 (!) 104 (!) 101  Resp:      Temp:      TempSrc:      SpO2: 92% 95% 95% 98%  Weight:  Height:        Intake/Output Summary (Last 24 hours) at 12/15/2020 1941 Last data filed at 12/15/2020 1853 Gross per 24 hour  Intake 2654.09 ml  Output 2194 ml  Net 460.09 ml   Filed Weights   12/12/20 2108  Weight: 57 kg    Examination:   Constitutional: NAD, AAOx3 HEENT: conjunctivae and lids normal, EOMI CV: No cyanosis.   RESP: normal respiratory effort, on RA Extremities: edema in BLE SKIN: warm, dry Neuro: II - XII grossly intact.   Psych: subdued mood and affect.   Foley  present.   Data Reviewed: I have personally reviewed following labs and imaging studies  CBC: Recent Labs  Lab 12/12/20 2111 12/13/20 0811 12/15/20 0536  WBC 7.2 8.8 7.5  NEUTROABS 4.9 8.1* 5.5  HGB 9.9* 9.5* 8.6*  HCT 28.9* 27.8* 25.4*  MCV 89.5 90.0 90.1  PLT 171 177 168   Basic Metabolic Panel: Recent Labs  Lab 12/12/20 2111 12/13/20 0811 12/13/20 1700 12/13/20 2241 12/14/20 0416 12/14/20 1206 12/15/20 0536  NA 135   < > 136 138 139 138 139  K 3.6   < > 3.6 3.4* 3.2* 3.4* 3.4*  CL 109   < > 109 111 114* 113* 113*  CO2 16*   < > 17* 19* 21* 23 22  GLUCOSE 142*   < > 145* 134* 121* 100* 176*  BUN 6   < > 6 6 6  5* <5*  CREATININE 1.09*   < > 1.29* 1.25* 1.23* 1.01* 1.06*  CALCIUM 7.9*   < > 8.0* 7.7* 7.5* 7.5* 7.4*  MG 1.6*  --   --   --   --   --  1.4*  PHOS  --   --   --   --   --   --  1.9*   < > = values in this interval not displayed.   GFR: Estimated Creatinine Clearance: 59.1 mL/min (A) (by C-G formula based on SCr of 1.06 mg/dL (H)). Liver Function Tests: Recent Labs  Lab 12/13/20 0811 12/15/20 0536  AST 32 25  ALT 15 12  ALKPHOS 102 78  BILITOT 3.3* 1.4*  PROT 5.2* 4.2*  ALBUMIN 1.8* 1.5*   No results for input(s): LIPASE, AMYLASE in the last 168 hours. No results for input(s): AMMONIA in the last 168 hours. Coagulation Profile: No results for input(s): INR, PROTIME in the last 168 hours. Cardiac Enzymes: No results for input(s): CKTOTAL, CKMB, CKMBINDEX, TROPONINI in the last 168 hours. BNP (last 3 results) No results for input(s): PROBNP in the last 8760 hours. HbA1C: No results for input(s): HGBA1C in the last 72 hours. CBG: Recent Labs  Lab 12/15/20 1130 12/15/20 1611 12/15/20 1719 12/15/20 1821 12/15/20 1926  GLUCAP 192* 178* 170* 171* 140*   Lipid Profile: Recent Labs    12/15/20 0536  TRIG 95   Thyroid Function Tests: No results for input(s): TSH, T4TOTAL, FREET4, T3FREE, THYROIDAB in the last 72 hours. Anemia  Panel: No results for input(s): VITAMINB12, FOLATE, FERRITIN, TIBC, IRON, RETICCTPCT in the last 72 hours. Sepsis Labs: No results for input(s): PROCALCITON, LATICACIDVEN in the last 168 hours.  Recent Results (from the past 240 hour(s))  Resp Panel by RT-PCR (Flu A&B, Covid) Nasopharyngeal Swab     Status: None   Collection Time: 12/13/20  1:48 AM   Specimen: Nasopharyngeal Swab; Nasopharyngeal(NP) swabs in vial transport medium  Result Value Ref Range Status   SARS Coronavirus 2 by RT PCR NEGATIVE  NEGATIVE Final    Comment: (NOTE) SARS-CoV-2 target nucleic acids are NOT DETECTED.  The SARS-CoV-2 RNA is generally detectable in upper respiratory specimens during the acute phase of infection. The lowest concentration of SARS-CoV-2 viral copies this assay can detect is 138 copies/mL. A negative result does not preclude SARS-Cov-2 infection and should not be used as the sole basis for treatment or other patient management decisions. A negative result may occur with  improper specimen collection/handling, submission of specimen other than nasopharyngeal swab, presence of viral mutation(s) within the areas targeted by this assay, and inadequate number of viral copies(<138 copies/mL). A negative result must be combined with clinical observations, patient history, and epidemiological information. The expected result is Negative.  Fact Sheet for Patients:  BloggerCourse.com  Fact Sheet for Healthcare Providers:  SeriousBroker.it  This test is no t yet approved or cleared by the [place] FDA and  has been authorized for detection and/or diagnosis of SARS-CoV-2 by FDA under an Emergency Use Authorization (EUA). This EUA will remain  in effect (meaning this test can be used) for the duration of the COVID-19 declaration under Section 564(b)(1) of the Act, 21 U.S.C.section 360bbb-3(b)(1), unless the authorization is terminated  or  revoked sooner.       Influenza A by PCR NEGATIVE NEGATIVE Final   Influenza B by PCR NEGATIVE NEGATIVE Final    Comment: (NOTE) The Xpert Xpress SARS-CoV-2/FLU/RSV plus assay is intended as an aid in the diagnosis of influenza from Nasopharyngeal swab specimens and should not be used as a sole basis for treatment. Nasal washings and aspirates are unacceptable for Xpert Xpress SARS-CoV-2/FLU/RSV testing.  Fact Sheet for Patients: BloggerCourse.com  Fact Sheet for Healthcare Providers: SeriousBroker.it  This test is not yet approved or cleared by the [place] FDA and has been authorized for detection and/or diagnosis of SARS-CoV-2 by FDA under an Emergency Use Authorization (EUA). This EUA will remain in effect (meaning this test can be used) for the duration of the COVID-19 declaration under Section 564(b)(1) of the Act, 21 U.S.C. section 360bbb-3(b)(1), unless the authorization is terminated or revoked.  Performed at [facility], [address] [place], [place] [address]       Radiology Studies: No results found.   Scheduled Meds:  aluminum-petrolatum-zinc  1 application Topical TID   Chlorhexidine Gluconate Cloth  6 each Topical Daily   docusate sodium  100 mg Oral Daily   enoxaparin (LOVENOX) injection  80 mg Subcutaneous Q12H   furosemide  40 mg Intravenous Once   mirtazapine  15 mg Oral QHS   prenatal multivitamin  1 tablet Oral Q1200   sodium chloride flush  10-40 mL Intracatheter Q12H   Continuous Infusions:  albumin human     dextrose 5% lactated ringers 60 mL/hr at 12/15/20 1911   famotidine (PEPCID) IV 20 mg (12/15/20 1158)   insulin 1 Units/hr (12/15/20 1928)   lactated ringers Stopped (12/15/20 1855)   ondansetron (ZOFRAN) IV 8 mg (12/15/20 1332)   potassium PHOSPHATE IVPB (in mmol) 15 mmol (12/15/20 1646)   promethazine (PHENERGAN) injection (IM or IVPB) 25 mg (12/15/20 1626)   TPN  ADULT (ION) 40 mL/hr at 12/15/20 1710     LOS: 2 days     Darlin Priestly, MD [facility] If 7PM-7AM, please contact night-coverage 12/15/2020, 7:41 PM

## 2020-12-15 NOTE — Progress Notes (Signed)
   12/15/20 1133  Provider Notification  Reason for Communication Review Case Greig Castilla)  Provider Name [facility]  Provider Role Other (Comment) (Dietician)  Method of Communication Face to face  Provider response In department    I reviewed patient's case with the dietician. Fleet Contras Hunter's recommendations is to get a GI consult and see if we can get a PEG tube placed for enteral nutrition as the patient is refusing oral or NG feedings, and although TPN may be a temporary fix, the long term standard of care is enteral nutrition for hyperemesis patients, especially given her diabetes and kidney injury and h/o CHF. I asked her about albumin in the meantime until TPN arrives and she said yes, she would absolutely recommend albumin for third spacing present in the patient. I will contact the hospitalist about the albumin order. I talked with Ashok Cordia, clinical lead about options of how consults to higher levels of care here at [facility], and I will recommend an intensivist consult to Dr. [name] once he is out of surgery and calls.   In addition, Fleet Contras, dietician's concern is that TPN will cause the blood sugars to be uncontrolled again as the dextrose will be much more concentrated.

## 2020-12-16 ENCOUNTER — Encounter (HOSPITAL_COMMUNITY): Payer: Self-pay | Admitting: Internal Medicine

## 2020-12-16 ENCOUNTER — Inpatient Hospital Stay (HOSPITAL_COMMUNITY)
Admission: AD | Admit: 2020-12-16 | Discharge: 2021-03-04 | DRG: 783 | Disposition: A | Discharge status: Home / Self Care | Payer: Medicaid Other | Source: Other Acute Inpatient Hospital | Attending: Obstetrics and Gynecology | Admitting: Obstetrics and Gynecology

## 2020-12-16 DIAGNOSIS — O34219 Maternal care for unspecified type scar from previous cesarean delivery: Secondary | ICD-10-CM | POA: Diagnosis not present

## 2020-12-16 DIAGNOSIS — O09899 Supervision of other high risk pregnancies, unspecified trimester: Secondary | ICD-10-CM

## 2020-12-16 DIAGNOSIS — R609 Edema, unspecified: Secondary | ICD-10-CM

## 2020-12-16 DIAGNOSIS — K209 Esophagitis, unspecified without bleeding: Secondary | ICD-10-CM | POA: Diagnosis not present

## 2020-12-16 DIAGNOSIS — E1129 Type 2 diabetes mellitus with other diabetic kidney complication: Secondary | ICD-10-CM | POA: Diagnosis not present

## 2020-12-16 DIAGNOSIS — K2210 Ulcer of esophagus without bleeding: Secondary | ICD-10-CM | POA: Diagnosis not present

## 2020-12-16 DIAGNOSIS — Z0371 Encounter for suspected problem with amniotic cavity and membrane ruled out: Secondary | ICD-10-CM | POA: Diagnosis not present

## 2020-12-16 DIAGNOSIS — O24013 Pre-existing diabetes mellitus, type 1, in pregnancy, third trimester: Secondary | ICD-10-CM | POA: Diagnosis present

## 2020-12-16 DIAGNOSIS — J90 Pleural effusion, not elsewhere classified: Secondary | ICD-10-CM | POA: Diagnosis not present

## 2020-12-16 DIAGNOSIS — I5043 Acute on chronic combined systolic (congestive) and diastolic (congestive) heart failure: Secondary | ICD-10-CM | POA: Diagnosis present

## 2020-12-16 DIAGNOSIS — E876 Hypokalemia: Secondary | ICD-10-CM | POA: Diagnosis not present

## 2020-12-16 DIAGNOSIS — Z978 Presence of other specified devices: Secondary | ICD-10-CM | POA: Diagnosis not present

## 2020-12-16 DIAGNOSIS — O2402 Pre-existing diabetes mellitus, type 1, in childbirth: Secondary | ICD-10-CM | POA: Diagnosis not present

## 2020-12-16 DIAGNOSIS — J9621 Acute and chronic respiratory failure with hypoxia: Secondary | ICD-10-CM

## 2020-12-16 DIAGNOSIS — Z4682 Encounter for fitting and adjustment of non-vascular catheter: Secondary | ICD-10-CM | POA: Diagnosis not present

## 2020-12-16 DIAGNOSIS — E872 Acidosis: Secondary | ICD-10-CM | POA: Diagnosis not present

## 2020-12-16 DIAGNOSIS — O252 Malnutrition in childbirth: Secondary | ICD-10-CM | POA: Diagnosis present

## 2020-12-16 DIAGNOSIS — O1414 Severe pre-eclampsia complicating childbirth: Secondary | ICD-10-CM | POA: Diagnosis not present

## 2020-12-16 DIAGNOSIS — I5020 Unspecified systolic (congestive) heart failure: Secondary | ICD-10-CM | POA: Diagnosis not present

## 2020-12-16 DIAGNOSIS — K828 Other specified diseases of gallbladder: Secondary | ICD-10-CM | POA: Diagnosis not present

## 2020-12-16 DIAGNOSIS — O24012 Pre-existing diabetes mellitus, type 1, in pregnancy, second trimester: Secondary | ICD-10-CM

## 2020-12-16 DIAGNOSIS — Z794 Long term (current) use of insulin: Secondary | ICD-10-CM

## 2020-12-16 DIAGNOSIS — O2492 Unspecified diabetes mellitus in childbirth: Secondary | ICD-10-CM | POA: Diagnosis not present

## 2020-12-16 DIAGNOSIS — D638 Anemia in other chronic diseases classified elsewhere: Secondary | ICD-10-CM | POA: Diagnosis present

## 2020-12-16 DIAGNOSIS — T730XXA Starvation, initial encounter: Secondary | ICD-10-CM | POA: Diagnosis present

## 2020-12-16 DIAGNOSIS — Z3A24 24 weeks gestation of pregnancy: Secondary | ICD-10-CM | POA: Diagnosis not present

## 2020-12-16 DIAGNOSIS — K3184 Gastroparesis: Secondary | ICD-10-CM | POA: Diagnosis present

## 2020-12-16 DIAGNOSIS — R600 Localized edema: Secondary | ICD-10-CM | POA: Diagnosis not present

## 2020-12-16 DIAGNOSIS — R109 Unspecified abdominal pain: Secondary | ICD-10-CM | POA: Diagnosis not present

## 2020-12-16 DIAGNOSIS — M79661 Pain in right lower leg: Secondary | ICD-10-CM | POA: Diagnosis not present

## 2020-12-16 DIAGNOSIS — Z3A31 31 weeks gestation of pregnancy: Secondary | ICD-10-CM

## 2020-12-16 DIAGNOSIS — E1010 Type 1 diabetes mellitus with ketoacidosis without coma: Secondary | ICD-10-CM | POA: Diagnosis present

## 2020-12-16 DIAGNOSIS — J9601 Acute respiratory failure with hypoxia: Secondary | ICD-10-CM | POA: Diagnosis not present

## 2020-12-16 DIAGNOSIS — N852 Hypertrophy of uterus: Secondary | ICD-10-CM | POA: Diagnosis not present

## 2020-12-16 DIAGNOSIS — E1043 Type 1 diabetes mellitus with diabetic autonomic (poly)neuropathy: Secondary | ICD-10-CM | POA: Diagnosis not present

## 2020-12-16 DIAGNOSIS — B952 Enterococcus as the cause of diseases classified elsewhere: Secondary | ICD-10-CM | POA: Diagnosis present

## 2020-12-16 DIAGNOSIS — E43 Unspecified severe protein-calorie malnutrition: Secondary | ICD-10-CM | POA: Diagnosis not present

## 2020-12-16 DIAGNOSIS — E10649 Type 1 diabetes mellitus with hypoglycemia without coma: Secondary | ICD-10-CM | POA: Diagnosis present

## 2020-12-16 DIAGNOSIS — O2692 Pregnancy related conditions, unspecified, second trimester: Secondary | ICD-10-CM | POA: Diagnosis not present

## 2020-12-16 DIAGNOSIS — E1042 Type 1 diabetes mellitus with diabetic polyneuropathy: Secondary | ICD-10-CM | POA: Diagnosis present

## 2020-12-16 DIAGNOSIS — Z3A25 25 weeks gestation of pregnancy: Secondary | ICD-10-CM | POA: Diagnosis not present

## 2020-12-16 DIAGNOSIS — O99413 Diseases of the circulatory system complicating pregnancy, third trimester: Secondary | ICD-10-CM | POA: Diagnosis not present

## 2020-12-16 DIAGNOSIS — N179 Acute kidney failure, unspecified: Secondary | ICD-10-CM

## 2020-12-16 DIAGNOSIS — O210 Mild hyperemesis gravidarum: Secondary | ICD-10-CM | POA: Diagnosis not present

## 2020-12-16 DIAGNOSIS — J811 Chronic pulmonary edema: Secondary | ICD-10-CM | POA: Diagnosis not present

## 2020-12-16 DIAGNOSIS — Z302 Encounter for sterilization: Secondary | ICD-10-CM

## 2020-12-16 DIAGNOSIS — D696 Thrombocytopenia, unspecified: Secondary | ICD-10-CM | POA: Diagnosis present

## 2020-12-16 DIAGNOSIS — E46 Unspecified protein-calorie malnutrition: Secondary | ICD-10-CM | POA: Diagnosis not present

## 2020-12-16 DIAGNOSIS — E1022 Type 1 diabetes mellitus with diabetic chronic kidney disease: Secondary | ICD-10-CM | POA: Diagnosis present

## 2020-12-16 DIAGNOSIS — Z3A34 34 weeks gestation of pregnancy: Secondary | ICD-10-CM | POA: Diagnosis not present

## 2020-12-16 DIAGNOSIS — I5042 Chronic combined systolic (congestive) and diastolic (congestive) heart failure: Secondary | ICD-10-CM

## 2020-12-16 DIAGNOSIS — Z3A28 28 weeks gestation of pregnancy: Secondary | ICD-10-CM | POA: Diagnosis not present

## 2020-12-16 DIAGNOSIS — I82621 Acute embolism and thrombosis of deep veins of right upper extremity: Secondary | ICD-10-CM | POA: Diagnosis not present

## 2020-12-16 DIAGNOSIS — Z86718 Personal history of other venous thrombosis and embolism: Secondary | ICD-10-CM

## 2020-12-16 DIAGNOSIS — Z452 Encounter for adjustment and management of vascular access device: Secondary | ICD-10-CM | POA: Diagnosis not present

## 2020-12-16 DIAGNOSIS — H4311 Vitreous hemorrhage, right eye: Secondary | ICD-10-CM | POA: Diagnosis present

## 2020-12-16 DIAGNOSIS — R0989 Other specified symptoms and signs involving the circulatory and respiratory systems: Secondary | ICD-10-CM | POA: Diagnosis not present

## 2020-12-16 DIAGNOSIS — D62 Acute posthemorrhagic anemia: Secondary | ICD-10-CM | POA: Diagnosis not present

## 2020-12-16 DIAGNOSIS — I429 Cardiomyopathy, unspecified: Secondary | ICD-10-CM | POA: Diagnosis present

## 2020-12-16 DIAGNOSIS — Z3A27 27 weeks gestation of pregnancy: Secondary | ICD-10-CM | POA: Diagnosis not present

## 2020-12-16 DIAGNOSIS — O34211 Maternal care for low transverse scar from previous cesarean delivery: Secondary | ICD-10-CM | POA: Diagnosis present

## 2020-12-16 DIAGNOSIS — O403XX0 Polyhydramnios, third trimester, not applicable or unspecified: Secondary | ICD-10-CM | POA: Diagnosis not present

## 2020-12-16 DIAGNOSIS — Z3A33 33 weeks gestation of pregnancy: Secondary | ICD-10-CM | POA: Diagnosis not present

## 2020-12-16 DIAGNOSIS — E2740 Unspecified adrenocortical insufficiency: Secondary | ICD-10-CM | POA: Diagnosis not present

## 2020-12-16 DIAGNOSIS — O99012 Anemia complicating pregnancy, second trimester: Secondary | ICD-10-CM | POA: Diagnosis not present

## 2020-12-16 DIAGNOSIS — R627 Adult failure to thrive: Secondary | ICD-10-CM | POA: Diagnosis not present

## 2020-12-16 DIAGNOSIS — R0603 Acute respiratory distress: Secondary | ICD-10-CM | POA: Diagnosis not present

## 2020-12-16 DIAGNOSIS — I5032 Chronic diastolic (congestive) heart failure: Secondary | ICD-10-CM | POA: Diagnosis not present

## 2020-12-16 DIAGNOSIS — E1065 Type 1 diabetes mellitus with hyperglycemia: Secondary | ICD-10-CM | POA: Diagnosis not present

## 2020-12-16 DIAGNOSIS — O0990 Supervision of high risk pregnancy, unspecified, unspecified trimester: Secondary | ICD-10-CM | POA: Diagnosis not present

## 2020-12-16 DIAGNOSIS — E1143 Type 2 diabetes mellitus with diabetic autonomic (poly)neuropathy: Secondary | ICD-10-CM | POA: Diagnosis present

## 2020-12-16 DIAGNOSIS — R195 Other fecal abnormalities: Secondary | ICD-10-CM | POA: Diagnosis not present

## 2020-12-16 DIAGNOSIS — N1330 Unspecified hydronephrosis: Secondary | ICD-10-CM | POA: Diagnosis present

## 2020-12-16 DIAGNOSIS — D649 Anemia, unspecified: Secondary | ICD-10-CM | POA: Diagnosis not present

## 2020-12-16 DIAGNOSIS — Z20822 Contact with and (suspected) exposure to covid-19: Secondary | ICD-10-CM | POA: Diagnosis not present

## 2020-12-16 DIAGNOSIS — K2101 Gastro-esophageal reflux disease with esophagitis, with bleeding: Secondary | ICD-10-CM | POA: Diagnosis not present

## 2020-12-16 DIAGNOSIS — O211 Hyperemesis gravidarum with metabolic disturbance: Secondary | ICD-10-CM | POA: Diagnosis not present

## 2020-12-16 DIAGNOSIS — Z23 Encounter for immunization: Secondary | ICD-10-CM | POA: Diagnosis not present

## 2020-12-16 DIAGNOSIS — E11319 Type 2 diabetes mellitus with unspecified diabetic retinopathy without macular edema: Secondary | ICD-10-CM | POA: Diagnosis present

## 2020-12-16 DIAGNOSIS — O1092 Unspecified pre-existing hypertension complicating childbirth: Secondary | ICD-10-CM | POA: Diagnosis not present

## 2020-12-16 DIAGNOSIS — O9942 Diseases of the circulatory system complicating childbirth: Secondary | ICD-10-CM | POA: Diagnosis not present

## 2020-12-16 DIAGNOSIS — N99840 Postprocedural hematoma of a genitourinary system organ or structure following a genitourinary system procedure: Secondary | ICD-10-CM | POA: Diagnosis not present

## 2020-12-16 DIAGNOSIS — R112 Nausea with vomiting, unspecified: Secondary | ICD-10-CM | POA: Diagnosis not present

## 2020-12-16 DIAGNOSIS — O99892 Other specified diseases and conditions complicating childbirth: Secondary | ICD-10-CM | POA: Diagnosis present

## 2020-12-16 DIAGNOSIS — O871 Deep phlebothrombosis in the puerperium: Secondary | ICD-10-CM | POA: Diagnosis not present

## 2020-12-16 DIAGNOSIS — N390 Urinary tract infection, site not specified: Secondary | ICD-10-CM | POA: Diagnosis not present

## 2020-12-16 DIAGNOSIS — R319 Hematuria, unspecified: Secondary | ICD-10-CM

## 2020-12-16 DIAGNOSIS — O9962 Diseases of the digestive system complicating childbirth: Secondary | ICD-10-CM | POA: Diagnosis present

## 2020-12-16 DIAGNOSIS — R1110 Vomiting, unspecified: Secondary | ICD-10-CM | POA: Diagnosis present

## 2020-12-16 DIAGNOSIS — Z3A29 29 weeks gestation of pregnancy: Secondary | ICD-10-CM | POA: Diagnosis not present

## 2020-12-16 DIAGNOSIS — O99013 Anemia complicating pregnancy, third trimester: Secondary | ICD-10-CM | POA: Diagnosis not present

## 2020-12-16 DIAGNOSIS — I5021 Acute systolic (congestive) heart failure: Secondary | ICD-10-CM | POA: Diagnosis not present

## 2020-12-16 DIAGNOSIS — K319 Disease of stomach and duodenum, unspecified: Secondary | ICD-10-CM | POA: Diagnosis not present

## 2020-12-16 DIAGNOSIS — Z95828 Presence of other vascular implants and grafts: Secondary | ICD-10-CM

## 2020-12-16 DIAGNOSIS — Z98891 History of uterine scar from previous surgery: Secondary | ICD-10-CM

## 2020-12-16 DIAGNOSIS — I110 Hypertensive heart disease with heart failure: Secondary | ICD-10-CM | POA: Diagnosis not present

## 2020-12-16 DIAGNOSIS — Z7901 Long term (current) use of anticoagulants: Secondary | ICD-10-CM

## 2020-12-16 DIAGNOSIS — Z3A32 32 weeks gestation of pregnancy: Secondary | ICD-10-CM | POA: Diagnosis not present

## 2020-12-16 DIAGNOSIS — O24019 Pre-existing diabetes mellitus, type 1, in pregnancy, unspecified trimester: Secondary | ICD-10-CM | POA: Diagnosis not present

## 2020-12-16 DIAGNOSIS — E10319 Type 1 diabetes mellitus with unspecified diabetic retinopathy without macular edema: Secondary | ICD-10-CM | POA: Diagnosis present

## 2020-12-16 DIAGNOSIS — O9081 Anemia of the puerperium: Secondary | ICD-10-CM | POA: Diagnosis not present

## 2020-12-16 DIAGNOSIS — I82623 Acute embolism and thrombosis of deep veins of upper extremity, bilateral: Secondary | ICD-10-CM | POA: Diagnosis present

## 2020-12-16 DIAGNOSIS — O9912 Other diseases of the blood and blood-forming organs and certain disorders involving the immune mechanism complicating childbirth: Secondary | ICD-10-CM | POA: Diagnosis present

## 2020-12-16 DIAGNOSIS — R52 Pain, unspecified: Secondary | ICD-10-CM

## 2020-12-16 DIAGNOSIS — K2100 Gastro-esophageal reflux disease with esophagitis, without bleeding: Secondary | ICD-10-CM | POA: Diagnosis present

## 2020-12-16 DIAGNOSIS — Z362 Encounter for other antenatal screening follow-up: Secondary | ICD-10-CM | POA: Diagnosis not present

## 2020-12-16 DIAGNOSIS — E8729 Other acidosis: Secondary | ICD-10-CM | POA: Diagnosis present

## 2020-12-16 DIAGNOSIS — O0993 Supervision of high risk pregnancy, unspecified, third trimester: Secondary | ICD-10-CM | POA: Diagnosis not present

## 2020-12-16 DIAGNOSIS — I313 Pericardial effusion (noninflammatory): Secondary | ICD-10-CM | POA: Diagnosis not present

## 2020-12-16 DIAGNOSIS — O212 Late vomiting of pregnancy: Secondary | ICD-10-CM | POA: Diagnosis present

## 2020-12-16 DIAGNOSIS — R188 Other ascites: Secondary | ICD-10-CM | POA: Diagnosis not present

## 2020-12-16 DIAGNOSIS — O99344 Other mental disorders complicating childbirth: Secondary | ICD-10-CM | POA: Diagnosis present

## 2020-12-16 DIAGNOSIS — L7632 Postprocedural hematoma of skin and subcutaneous tissue following other procedure: Secondary | ICD-10-CM | POA: Diagnosis not present

## 2020-12-16 DIAGNOSIS — F418 Other specified anxiety disorders: Secondary | ICD-10-CM | POA: Diagnosis not present

## 2020-12-16 DIAGNOSIS — K920 Hematemesis: Secondary | ICD-10-CM | POA: Diagnosis not present

## 2020-12-16 DIAGNOSIS — O402XX0 Polyhydramnios, second trimester, not applicable or unspecified: Secondary | ICD-10-CM | POA: Diagnosis not present

## 2020-12-16 DIAGNOSIS — O289 Unspecified abnormal findings on antenatal screening of mother: Secondary | ICD-10-CM | POA: Diagnosis not present

## 2020-12-16 DIAGNOSIS — E109 Type 1 diabetes mellitus without complications: Secondary | ICD-10-CM

## 2020-12-16 DIAGNOSIS — T829XXA Unspecified complication of cardiac and vascular prosthetic device, implant and graft, initial encounter: Secondary | ICD-10-CM

## 2020-12-16 DIAGNOSIS — I517 Cardiomegaly: Secondary | ICD-10-CM | POA: Diagnosis not present

## 2020-12-16 DIAGNOSIS — O9952 Diseases of the respiratory system complicating childbirth: Secondary | ICD-10-CM | POA: Diagnosis present

## 2020-12-16 DIAGNOSIS — I10 Essential (primary) hypertension: Secondary | ICD-10-CM | POA: Diagnosis not present

## 2020-12-16 DIAGNOSIS — O99019 Anemia complicating pregnancy, unspecified trimester: Secondary | ICD-10-CM | POA: Diagnosis present

## 2020-12-16 DIAGNOSIS — N189 Chronic kidney disease, unspecified: Secondary | ICD-10-CM

## 2020-12-16 DIAGNOSIS — N182 Chronic kidney disease, stage 2 (mild): Secondary | ICD-10-CM | POA: Diagnosis present

## 2020-12-16 DIAGNOSIS — O0992 Supervision of high risk pregnancy, unspecified, second trimester: Secondary | ICD-10-CM | POA: Diagnosis not present

## 2020-12-16 DIAGNOSIS — R310 Gross hematuria: Secondary | ICD-10-CM

## 2020-12-16 DIAGNOSIS — Z3A26 26 weeks gestation of pregnancy: Secondary | ICD-10-CM | POA: Diagnosis not present

## 2020-12-16 DIAGNOSIS — R748 Abnormal levels of other serum enzymes: Secondary | ICD-10-CM | POA: Diagnosis not present

## 2020-12-16 DIAGNOSIS — I82401 Acute embolism and thrombosis of unspecified deep veins of right lower extremity: Secondary | ICD-10-CM | POA: Diagnosis not present

## 2020-12-16 DIAGNOSIS — Z9641 Presence of insulin pump (external) (internal): Secondary | ICD-10-CM | POA: Diagnosis present

## 2020-12-16 DIAGNOSIS — F5104 Psychophysiologic insomnia: Secondary | ICD-10-CM | POA: Diagnosis present

## 2020-12-16 DIAGNOSIS — I5041 Acute combined systolic (congestive) and diastolic (congestive) heart failure: Secondary | ICD-10-CM | POA: Diagnosis not present

## 2020-12-16 DIAGNOSIS — R7989 Other specified abnormal findings of blood chemistry: Secondary | ICD-10-CM | POA: Diagnosis not present

## 2020-12-16 DIAGNOSIS — IMO0002 Reserved for concepts with insufficient information to code with codable children: Secondary | ICD-10-CM

## 2020-12-16 DIAGNOSIS — O902 Hematoma of obstetric wound: Secondary | ICD-10-CM | POA: Diagnosis not present

## 2020-12-16 LAB — BASIC METABOLIC PANEL
Anion gap: 5 (ref 5–15)
BUN: <5 mg/dL — ABNORMAL LOW (ref 6–20)
CO2: 25 mmol/L (ref 22–32)
Calcium: 7.6 mg/dL — ABNORMAL LOW (ref 8.9–10.3)
Chloride: 109 mmol/L (ref 98–111)
Creatinine, Ser: 1.09 mg/dL — ABNORMAL HIGH (ref 0.44–1.00)
GFR, Estimated: >60 mL/min (ref >60)
Glucose, Bld: 143 mg/dL — ABNORMAL HIGH (ref 70–99)
Potassium: 3.5 mmol/L (ref 3.5–5.1)
Sodium: 139 mmol/L (ref 135–145)

## 2020-12-16 LAB — GLUCOSE, CAPILLARY
Glucose-Capillary: 111 mg/dL — ABNORMAL HIGH (ref 70–99)
Glucose-Capillary: 111 mg/dL — ABNORMAL HIGH (ref 70–99)
Glucose-Capillary: 113 mg/dL — ABNORMAL HIGH (ref 70–99)
Glucose-Capillary: 117 mg/dL — ABNORMAL HIGH (ref 70–99)
Glucose-Capillary: 118 mg/dL — ABNORMAL HIGH (ref 70–99)
Glucose-Capillary: 120 mg/dL — ABNORMAL HIGH (ref 70–99)
Glucose-Capillary: 122 mg/dL — ABNORMAL HIGH (ref 70–99)
Glucose-Capillary: 122 mg/dL — ABNORMAL HIGH (ref 70–99)
Glucose-Capillary: 123 mg/dL — ABNORMAL HIGH (ref 70–99)
Glucose-Capillary: 128 mg/dL — ABNORMAL HIGH (ref 70–99)
Glucose-Capillary: 136 mg/dL — ABNORMAL HIGH (ref 70–99)
Glucose-Capillary: 89 mg/dL (ref 70–99)

## 2020-12-16 LAB — MAGNESIUM
Magnesium: 1.7 mg/dL (ref 1.7–2.4)
Magnesium: 1.7 mg/dL (ref 1.7–2.4)

## 2020-12-16 LAB — PHOSPHORUS: Phosphorus: 3.2 mg/dL (ref 2.5–4.6)

## 2020-12-16 LAB — POTASSIUM: Potassium: 3.5 mmol/L (ref 3.5–5.1)

## 2020-12-16 MED ORDER — FAMOTIDINE IN NACL 20-0.9 MG/50ML-% IV SOLN
20.0000 mg | Freq: Two times a day (BID) | INTRAVENOUS | Status: DC
  Administered 2020-12-16 – 2020-12-17 (×3): 20 mg via INTRAVENOUS
  Filled 2020-12-16 (×4): qty 50

## 2020-12-16 MED ORDER — ALBUMIN HUMAN 25 % IV SOLN: 25.0000 g | Freq: Once | INTRAVENOUS | Status: DC

## 2020-12-16 MED ORDER — SODIUM CHLORIDE 0.9 % IV SOLN
25.0000 mg | Freq: Four times a day (QID) | INTRAVENOUS | Status: DC | PRN
  Administered 2020-12-18 – 2021-01-08 (×18): 25 mg via INTRAVENOUS
  Filled 2020-12-16 (×12): qty 1
  Filled 2020-12-16 (×2): qty 25
  Filled 2020-12-16 (×6): qty 1

## 2020-12-16 MED ORDER — ONDANSETRON HCL 4 MG/2ML IJ SOLN
4.0000 mg | Freq: Three times a day (TID) | INTRAMUSCULAR | Status: DC
  Administered 2020-12-16 – 2020-12-19 (×8): 4 mg via INTRAVENOUS
  Filled 2020-12-16 (×9): qty 2

## 2020-12-16 MED ORDER — FERROUS SULFATE 325 (65 FE) MG PO TABS: 324.0000 mg | ORAL_TABLET | Freq: Every day | ORAL | Status: DC

## 2020-12-16 MED ORDER — SODIUM CHLORIDE 0.9 % IV SOLN: 25.0000 mg | Freq: Four times a day (QID) | INTRAVENOUS | Status: DC | PRN

## 2020-12-16 MED ORDER — INSULIN REGULAR(HUMAN) IN NACL 100-0.9 UT/100ML-% IV SOLN
INTRAVENOUS | Status: DC
  Administered 2020-12-16: 0.6 [IU]/h via INTRAVENOUS
  Administered 2020-12-17: 1 [IU]/h via INTRAVENOUS
  Administered 2020-12-17: 2.2 [IU]/h via INTRAVENOUS
  Administered 2020-12-17: 2.8 [IU]/h via INTRAVENOUS
  Administered 2020-12-19: 2.2 [IU]/h via INTRAVENOUS
  Administered 2020-12-21 – 2020-12-23 (×2): 1.1 [IU]/h via INTRAVENOUS
  Administered 2020-12-23: 4.6 [IU]/h via INTRAVENOUS
  Administered 2020-12-26: 0.8 [IU]/h via INTRAVENOUS
  Administered 2020-12-29: 1.2 [IU]/h via INTRAVENOUS
  Administered 2020-12-29: 1.3 [IU]/h via INTRAVENOUS
  Administered 2020-12-31: 1.2 [IU]/h via INTRAVENOUS
  Administered 2021-01-03: 2 [IU]/h via INTRAVENOUS
  Administered 2021-01-03: 0.7 [IU]/h via INTRAVENOUS
  Administered 2021-01-04: 1.3 [IU]/h via INTRAVENOUS
  Administered 2021-01-07: 2.2 [IU]/h via INTRAVENOUS
  Administered 2021-01-09: 0.5 [IU]/h via INTRAVENOUS
  Administered 2021-01-10: 1.9 [IU]/h via INTRAVENOUS
  Administered 2021-01-11: 1.5 [IU]/h via INTRAVENOUS
  Administered 2021-01-12: 0.6 [IU]/h via INTRAVENOUS
  Filled 2020-12-16 (×14): qty 100

## 2020-12-16 MED ORDER — ALUMINUM-PETROLATUM-ZINC (1-2-3 PASTE) 0.027-13.7-10% PASTE: 1.0000 "application " | PASTE | Freq: Three times a day (TID) | CUTANEOUS | Status: DC

## 2020-12-16 MED ORDER — ENOXAPARIN SODIUM 80 MG/0.8ML IJ SOSY
80.0000 mg | PREFILLED_SYRINGE | Freq: Two times a day (BID) | INTRAMUSCULAR | Status: DC
  Administered 2020-12-16: 80 mg via SUBCUTANEOUS
  Filled 2020-12-16: qty 0.8

## 2020-12-16 MED ORDER — FUROSEMIDE 10 MG/ML IJ SOLN
40.0000 mg | Freq: Once | INTRAMUSCULAR | Status: AC
  Administered 2020-12-16: 40 mg via INTRAVENOUS
  Filled 2020-12-16: qty 4

## 2020-12-16 MED ORDER — DEXTROSE 50 % IV SOLN
0.0000 mL | INTRAVENOUS | Status: DC | PRN
  Administered 2020-12-23: 25 mL via INTRAVENOUS
  Administered 2020-12-28: 35 mL via INTRAVENOUS
  Administered 2021-01-02 – 2021-01-12 (×3): 30 mL via INTRAVENOUS
  Filled 2020-12-16 (×5): qty 50

## 2020-12-16 MED ORDER — SODIUM CHLORIDE 0.9 % IV SOLN
250.0000 mL | INTRAVENOUS | Status: DC | PRN
  Administered 2020-12-19 – 2021-01-08 (×3): 250 mL via INTRAVENOUS

## 2020-12-16 MED ORDER — PRENATAL MULTIVITAMIN CH
1.0000 | ORAL_TABLET | Freq: Every day | ORAL | Status: DC
  Filled 2020-12-16 (×2): qty 1

## 2020-12-16 MED ORDER — SODIUM CHLORIDE 0.9% FLUSH
3.0000 mL | INTRAVENOUS | Status: DC | PRN
  Administered 2020-12-26 – 2021-01-07 (×3): 3 mL via INTRAVENOUS

## 2020-12-16 MED ORDER — ONDANSETRON HCL 4 MG/2ML IJ SOLN: 4.0000 mg | Freq: Three times a day (TID) | INTRAMUSCULAR | 0 refills | Status: DC

## 2020-12-16 MED ORDER — FUROSEMIDE 10 MG/ML IJ SOLN: 40.0000 mg | Freq: Once | INTRAMUSCULAR | Status: DC

## 2020-12-16 MED ORDER — FAMOTIDINE IN NACL 20-0.9 MG/50ML-% IV SOLN: 20.0000 mg | Freq: Two times a day (BID) | INTRAVENOUS | Status: DC

## 2020-12-16 MED ORDER — ENOXAPARIN SODIUM 80 MG/0.8ML IJ SOSY: 80.0000 mg | PREFILLED_SYRINGE | Freq: Two times a day (BID) | INTRAMUSCULAR | Status: DC

## 2020-12-16 MED ORDER — LACTATED RINGERS IV SOLN: INTRAVENOUS | Status: DC

## 2020-12-16 MED ORDER — PROCHLORPERAZINE EDISYLATE 10 MG/2ML IJ SOLN
10.0000 mg | Freq: Four times a day (QID) | INTRAMUSCULAR | Status: DC | PRN
Start: 2020-12-16 — End: 2021-03-04

## 2020-12-16 MED ORDER — MIRTAZAPINE 15 MG PO TBDP
15.0000 mg | ORAL_TABLET | Freq: Every day | ORAL | Status: DC
  Administered 2020-12-16 – 2020-12-17 (×2): 15 mg via ORAL
  Filled 2020-12-16 (×2): qty 1

## 2020-12-16 MED ORDER — GUAIFENESIN-DM 100-10 MG/5ML PO SYRP
10.0000 mL | ORAL_SOLUTION | Freq: Four times a day (QID) | ORAL | Status: DC | PRN
  Administered 2020-12-16: 10 mL via ORAL
  Filled 2020-12-16 (×2): qty 10

## 2020-12-16 MED ORDER — ACETAMINOPHEN 325 MG PO TABS: 650.0000 mg | ORAL_TABLET | Freq: Four times a day (QID) | ORAL | Status: DC | PRN

## 2020-12-16 MED ORDER — SODIUM CHLORIDE 0.9% FLUSH
3.0000 mL | Freq: Two times a day (BID) | INTRAVENOUS | Status: DC
  Administered 2020-12-16 – 2021-01-02 (×21): 3 mL via INTRAVENOUS
  Administered 2021-01-03: 10 mL via INTRAVENOUS
  Administered 2021-01-03 – 2021-01-16 (×21): 3 mL via INTRAVENOUS

## 2020-12-16 MED ORDER — PROCHLORPERAZINE EDISYLATE 10 MG/2ML IJ SOLN
10.0000 mg | Freq: Four times a day (QID) | INTRAMUSCULAR | Status: DC | PRN
  Administered 2020-12-17 – 2021-02-02 (×11): 10 mg via INTRAVENOUS
  Filled 2020-12-16 (×15): qty 2

## 2020-12-16 MED ORDER — ALBUMIN HUMAN 25 % IV SOLN
25.0000 g | Freq: Once | INTRAVENOUS | Status: AC
  Administered 2020-12-16: 25 g via INTRAVENOUS
  Filled 2020-12-16: qty 100

## 2020-12-16 MED ORDER — ACETAMINOPHEN 650 MG RE SUPP: 650.0000 mg | Freq: Four times a day (QID) | RECTAL | Status: DC | PRN

## 2020-12-16 MED ORDER — DEXTROSE IN LACTATED RINGERS 5 % IV SOLN: INTRAVENOUS | Status: DC

## 2020-12-16 MED ORDER — MIRTAZAPINE 15 MG PO TBDP: 15.0000 mg | ORAL_TABLET | Freq: Every day | ORAL | Status: DC

## 2020-12-16 MED ORDER — TRACE MINERALS CU-MN-SE-ZN 300-55-60-3000 MCG/ML IV SOLN
INTRAVENOUS | Status: DC
  Filled 2020-12-16: qty 655.2

## 2020-12-16 NOTE — Consult Note (Signed)
PHARMACY - TOTAL PARENTERAL NUTRITION CONSULT NOTE   Indication:  inability to tolerate post-pyloric dobhoff and signs of starvation ISO pregnancy  Patient Measurements: Height: 5\' 1"  (154.9 cm) Weight: 57 kg (125 lb 10.6 oz) IBW/kg (Calculated) : 47.8 TPN AdjBW (KG): 57 Body mass index is 23.74 kg/m. Usual Weight: 57kg   Assessment: 30 year old female at [redacted] weeks gestation with a history of type 1 diabetes. Patient with hyperemesis gravidarum. Patient did not tolerate NGT at [facility] and currently does not want to attempt post-pyloric dobhoff placement. She is started on TPN.  Glucose / Insulin: on insulin drip Electrolytes: improved Renal: stable/improved Hepatic: AST/ALT WNL; Tbili 3.3>1.4 Intake / Output; MIVF: net positive 2.2 L GI Imaging: N/A GI Surgeries / Procedures: N/A  Central access: PICC (7/24) TPN start date: 7/25 PM  Nutritional Goals (per RD recommendation on 7/26): kCal: 1800-2000 kcal/d, Protein: 90-100g of protein, Fluid: 1.4-1.7 L/day Goal TPN rate is 65 mL/hr (provides 98 g of protein and 1840 kcals per day)  Current Nutrition: TPN  Plan:  Advance TPN to goal 65 mL/hr at 1800  Electrolytes in TPN: Na 42mEq/L, K 59mEq/L, Ca 68mEq/L, Mg 39mEq/L, and Phos 57mmol/L. Cl:Ac 1:1  Add standard MVI and trace elements to TPN  Add folic acid and thiamine to TPN for 3 days  Continue insulin drip  Stop MIVF at 1800 once TPN advanced to goal rate  Monitor TPN labs on Mon/Thurs, and PRN  12m, PharmD, BCPS 12/16/2020,11:17 AM

## 2020-12-16 NOTE — Discharge Summary (Addendum)
Physician Discharge Summary   Ashley Camacho  female DOB: [birth year]  RXV:[id number]  PCP: Hildred Laser, MD  Admit date: 12/12/2020 Discharge date: 12/16/2020  Admitted From: home Disposition:  to [facility] CODE STATUS: Full code   Hospital Course:  For full details, please see H&P, progress notes, consult notes and ancillary notes.  Briefly,  Ashley Camacho is a 30 y.o. female [redacted]w[redacted]d on presentation, Estimated Date of Delivery: 03/30/21 by by 9 week ultrasound, Type 1 diabetic, Cardiomyopathy, chronic systolic heart failure who was admitted to [facility] service initially for hyperemesis gravidarum x 2 days.   Patient has a history of recent admission to [facility] on 11/20/20 for similar complaints, unable to tolerate PO, admitted for hyperemesis.  She was treated with multiple antiemetics with only modest improvement.  She was eventually transferred to [facility] for sepsis secondary to pneumonia and was there from 7/4 to 12/10/20.  She initially had an insulin pump but has been switched to subcutaneous Lantus and NovoLog and states that her last dose of insulin was 1 day prior to her current admission because she has been unable to tolerate any oral intake.  During current hospitalization, pt was not able to have oral intake due to severe nausea and vomiting.  With almost 1 month of poor oral intake, pt had an albumin level of 1.8, was becoming edematous from 3rd spacing.  Ob Dr. [name] requested Hospitalist Medicine to take over as primary, and believed that the fetus was doing well.  Decision was made to start alternative method of providing nutrition.  Pt initially refused Dobhoff tube feed, and PEG tube was considered unsafe.  Pt was therefore started on TPN.  There was a question whether insurance would pay for in-home TPN, so pt was convince to receive a transpyloric Cortrek tube that can be placed at [facility] with minimum radiation exposure to the fetus.    Also care team and nursing  staff were concerned about pt being a high-risk ob pt, so request was made to transfer to [facility], where maternal fetal medicine Dr. Grace Bushy will see pt as consult.    Below are active medical issues being managed.  # Starvation ketoacidosis vs DKA --anion gap of 14 with a serum bicarbonate level of 12.  Beta hydroxybutyric acid was also elevated.  However, no acidosis per ABG. --started on insulin gtt with endo-tool and transitioned to subQ insulin on 7/24.   --due to pt's inability to take in orals and initial refusal for tube feed, pt was started on TPN.  Pharm, nutrition and diabetic coordinator managed her TPN while receiving insulin gtt to cover for the BG from TPN.     # DM1 # Hypoglycemia --transitioned off insulin gtt to 5u Lantus on 7/24, however, has been having hypoglycemic episodes throughout the day. --Hold long-acting insulin for now --insulin gtt for now with TPN   # Hyperemesis gravidarum Treatment plan per obstetrics --cont scheduled IV zofran  --IV phenergan and compazine PRN --cont IV Pepcid   # Severe malnutrition on TPN Secondary to poor oral intake --Patient's albumin level is 1.8.  Has not had adequate nutrition for at least a month. --did not tolerate NG tube presence (tried at [facility] --started TPN on 7/25, and should continue until secure enteral feeding can be secured. --cont Remeron 15 mg nightly  --monitor for refeeding syndrome --will transition to tube feed via transpyloric Cortrek tube which will be placed at [facility].     Hypoalbuminemia Edema due to  3rd spacing --Has been receiving IV lasix 40 mg BID with IV albumin 25g support, with good urine output and stable Cr.   # Cardiomyopathy chronic systolic heart failure 2D echocardiogram shows an LVEF of 45% Patient has bilateral lower extremity swelling and developed shortness of breath with IV fluid hydration as well as hypoxia that improved following administration of IV diuretics. --Has been receiving IV  lasix 40 mg BID with IV albumin 25g support, with good urine output and stable Cr.   # History of right brachial DVT --Continue Lovenox 80 mg SQ twice daily per recommendations from [facility]   Discharge Diagnoses:  Principal Problem:   DKA, type 1, not at goal [facility]) Active Problems:   Anemia affecting pregnancy in second trimester   Hyperemesis   Pregnancy with 24 completed weeks gestation   Sunburn   Starvation ketoacidosis   30 Day Unplanned Readmission Risk Score    Flowsheet Row ED to Hosp-Admission (Current) from 12/12/2020 in [facility] ICU/CCU  30 Day Unplanned Readmission Risk Score (%) 26.6 Filed at 12/16/2020 1600       This score is the patient's risk of an unplanned readmission within 30 days of being discharged (0 -100%). The score is based on dignosis, age, lab data, medications, orders, and past utilization.   Low:  0-14.9   Medium: 15-21.9   High: 22-29.9   Extreme: 30 and above         Discharge Instructions:  Allergies as of 12/16/2020   No Known Allergies      Medication List     STOP taking these medications    aspirin EC 81 MG tablet   DICLEGIS PO   famotidine 20 MG tablet Commonly known as: PEPCID   insulin aspart 100 UNIT/ML injection Commonly known as: novoLOG   ondansetron 4 MG disintegrating tablet Commonly known as: Zofran ODT   prochlorperazine 10 MG tablet Commonly known as: COMPAZINE       TAKE these medications    aluminum-petrolatum-zinc 0.027-13.7-12.5% Pste paste Commonly known as: 1-2-3 PASTE Apply 1 application topically 3 (three) times daily.   enoxaparin 80 MG/0.8ML injection Commonly known as: LOVENOX Inject 0.8 mLs (80 mg total) into the skin every 12 (twelve) hours.   famotidine 20-0.9 MG/50ML-% Commonly known as: PEPCID Inject 50 mLs (20 mg total) into the vein every 12 (twelve) hours.   ferrous sulfate 324 MG Tbec Take 324 mg by mouth.   mirtazapine 15 MG disintegrating  tablet Commonly known as: REMERON SOL-TAB Take 1 tablet (15 mg total) by mouth at bedtime.   ondansetron 4 MG/2ML Soln injection Commonly known as: ZOFRAN Inject 2 mLs (4 mg total) into the vein every 8 (eight) hours.   prenatal multivitamin Tabs tablet Take 1 tablet by mouth daily at 12 noon.   prochlorperazine 10 MG/2ML injection Commonly known as: COMPAZINE Inject 2 mLs (10 mg total) into the vein every 6 (six) hours as needed.   sodium chloride 0.9 % SOLN 50 mL with promethazine 25 MG/ML SOLN 25 mg Inject 25 mg into the vein every 6 (six) hours as needed.          No Known Allergies   The results of significant diagnostics from this hospitalization (including imaging, microbiology, ancillary and laboratory) are listed below for reference.   Consultations:   Procedures/Studies: CT Angio Chest Pulmonary Embolism (PE) W or WO Contrast  Result Date: 11/22/2020 CLINICAL DATA:  Pulmonary embolus suspected with low to intermediate probability. Positive D-dimer. Shortness  of breath and chest pain. EXAM: CT ANGIOGRAPHY CHEST WITH CONTRAST TECHNIQUE: Multidetector CT imaging of the chest was performed using the standard protocol during bolus administration of intravenous contrast. Multiplanar CT image reconstructions and MIPs were obtained to evaluate the vascular anatomy. CONTRAST:  75mL OMNIPAQUE IOHEXOL 350 MG/ML SOLN COMPARISON:  05/14/2016 FINDINGS: Cardiovascular: Good opacification of the central and segmental pulmonary arteries. No focal filling defects. No evidence of significant pulmonary embolus. Normal heart size. No pericardial effusions. Normal caliber thoracic aorta. No dissection. Mediastinum/Nodes: No significant lymphadenopathy in the chest. Esophagus is decompressed. Thyroid gland is unremarkable. Lungs/Pleura: Motion artifact limits examination. There are moderate bilateral pleural effusions with basilar atelectasis or consolidation. Perihilar infiltrates and  interstitial changes in the lungs likely to represent edema. Focal nodular infiltrates may represent multifocal pneumonia. Upper Abdomen: No acute abnormalities demonstrated in the visualized upper abdomen. Musculoskeletal: No chest wall abnormality. No acute or significant osseous findings. Review of the MIP images confirms the above findings. IMPRESSION: 1. No evidence of significant pulmonary embolus. 2. Moderate bilateral pleural effusions with bilateral basilar atelectasis or consolidation. 3. Probable perihilar edema. 4. Additional nodular infiltrative foci throughout the lungs may represent superimposed multifocal pneumonia. Electronically Signed   By: Burman NievesWilliam  Stevens M.D.   On: 11/22/2020 01:10   US Venous Img Lower Bilateral (DVT)  Result Date: 11/22/2020 CLINICAL DATA:  Twenty-one week gravid female with bilateral lower extremity edema EXAM: BILATERAL LOWER EXTREMITY VENOUS DOPPLER ULTRASOUND TECHNIQUE: Gray-scale sonography with graded compression, as well as color Doppler and duplex ultrasound were performed to evaluate the lower extremity deep venous systems from the level of the common femoral vein and including the common femoral, femoral, profunda femoral, popliteal and calf veins including the posterior tibial, peroneal and gastrocnemius veins when visible. The superficial great saphenous vein was also interrogated. Spectral Doppler was utilized to evaluate flow at rest and with distal augmentation maneuvers in the common femoral, femoral and popliteal veins. COMPARISON:  None. FINDINGS: RIGHT LOWER EXTREMITY Common Femoral Vein: No evidence of thrombus. Normal compressibility, respiratory phasicity and response to augmentation. Saphenofemoral Junction: No evidence of thrombus. Normal compressibility and flow on color Doppler imaging. Profunda Femoral Vein: No evidence of thrombus. Normal compressibility and flow on color Doppler imaging. Femoral Vein: No evidence of thrombus. Normal  compressibility, respiratory phasicity and response to augmentation. Popliteal Vein: No evidence of thrombus. Normal compressibility, respiratory phasicity and response to augmentation. Calf Veins: No evidence of thrombus. Normal compressibility and flow on color Doppler imaging. Superficial Great Saphenous Vein: No evidence of thrombus. Normal compressibility. Venous Reflux:  None. Other Findings:  None. LEFT LOWER EXTREMITY Common Femoral Vein: No evidence of thrombus. Normal compressibility, respiratory phasicity and response to augmentation. Saphenofemoral Junction: No evidence of thrombus. Normal compressibility and flow on color Doppler imaging. Profunda Femoral Vein: No evidence of thrombus. Normal compressibility and flow on color Doppler imaging. Femoral Vein: No evidence of thrombus. Normal compressibility, respiratory phasicity and response to augmentation. Popliteal Vein: No evidence of thrombus. Normal compressibility, respiratory phasicity and response to augmentation. Calf Veins: No evidence of thrombus. Normal compressibility and flow on color Doppler imaging. Superficial Great Saphenous Vein: No evidence of thrombus. Normal compressibility. Venous Reflux:  None. Other Findings:  None. IMPRESSION: No evidence of deep venous thrombosis in either lower extremity. Electronically Signed   By: Malachy MoanHeath  McCullough M.D.   On: 11/22/2020 08:10   DG Chest Port 1 View  Result Date: 12/13/2020 CLINICAL DATA:  Difficulty breathing. EXAM: PORTABLE CHEST 1 VIEW COMPARISON:  November 24, 2020. FINDINGS: The heart size and mediastinal contours are within normal limits. Left lung is clear. Mild right basilar opacity is noted concerning for atelectasis and possible associated effusion. No pneumothorax is noted. The visualized skeletal structures are unremarkable. IMPRESSION: Mild right basilar opacity is noted concerning for atelectasis and possible associated pleural effusion. Electronically Signed   By: Lupita Raider  M.D.   On: 12/13/2020 11:22   DG Chest Port 1 View  Result Date: 11/24/2020 CLINICAL DATA:  Pneumonia.  Pregnant EXAM: PORTABLE CHEST 1 VIEW COMPARISON:  CT 11/22/2020 FINDINGS: Normal cardiac silhouette. bilateral moderate pleural effusions. There is central venous congestion and mild pulmonary edema. No pneumothorax. IMPRESSION: 1. Bilateral pleural effusions similar comparison CT. 2. Potential increase in central pulmonary edema pattern Electronically Signed   By: Genevive Bi M.D.   On: 11/24/2020 11:54   [place] EKG SITE RITE  Result Date: 12/13/2020 If Site Rite image not attached, placement could not be confirmed due to current cardiac rhythm.     Labs: BNP (last 3 results) Recent Labs    11/24/20 1026  BNP 1,108.1*   Basic Metabolic Panel: Recent Labs  Lab 12/12/20 2111 12/13/20 0811 12/13/20 2241 12/14/20 0416 12/14/20 1206 12/15/20 0536 12/15/20 2328 12/16/20 0430  NA 135   < > 138 139 138 139  --  139  K 3.6   < > 3.4* 3.2* 3.4* 3.4* 3.5 3.5  CL 109   < > 111 114* 113* 113*  --  109  CO2 16*   < > 19* 21* 23 22  --  25  GLUCOSE 142*   < > 134* 121* 100* 176*  --  143*  BUN 6   < > 6 6 5* <5*  --  <5*  CREATININE 1.09*   < > 1.25* 1.23* 1.01* 1.06*  --  1.09*  CALCIUM 7.9*   < > 7.7* 7.5* 7.5* 7.4*  --  7.6*  MG 1.6*  --   --   --   --  1.4* 1.7 1.7  PHOS  --   --   --   --   --  1.9*  --  3.2   < > = values in this interval not displayed.   Liver Function Tests: Recent Labs  Lab 12/13/20 0811 12/15/20 0536  AST 32 25  ALT 15 12  ALKPHOS 102 78  BILITOT 3.3* 1.4*  PROT 5.2* 4.2*  ALBUMIN 1.8* 1.5*   No results for input(s): LIPASE, AMYLASE in the last 168 hours. No results for input(s): AMMONIA in the last 168 hours. CBC: Recent Labs  Lab 12/12/20 2111 12/13/20 0811 12/15/20 0536  WBC 7.2 8.8 7.5  NEUTROABS 4.9 8.1* 5.5  HGB 9.9* 9.5* 8.6*  HCT 28.9* 27.8* 25.4*  MCV 89.5 90.0 90.1  PLT 171 177 168   Cardiac Enzymes: No results for  input(s): CKTOTAL, CKMB, CKMBINDEX, TROPONINI in the last 168 hours. BNP: Invalid input(s): POCBNP CBG: Recent Labs  Lab 12/16/20 1401 12/16/20 1508 12/16/20 1644 12/16/20 1746 12/16/20 1939  GLUCAP 89 117* 122* 123* 136*   D-Dimer No results for input(s): DDIMER in the last 72 hours. Hgb A1c No results for input(s): HGBA1C in the last 72 hours. Lipid Profile Recent Labs    12/15/20 0536  TRIG 95   Thyroid function studies No results for input(s): TSH, T4TOTAL, T3FREE, THYROIDAB in the last 72 hours.  Invalid input(s): FREET3 Anemia work up No results for input(s): VITAMINB12, FOLATE, FERRITIN,  TIBC, IRON, RETICCTPCT in the last 72 hours. Urinalysis    Component Value Date/Time   COLORURINE AMBER (A) 12/13/2020 2021   APPEARANCEUR CLOUDY (A) 12/13/2020 2021   APPEARANCEUR Clear 09/18/2020 1153   LABSPEC 1.012 12/13/2020 2021   LABSPEC 1.026 03/01/2013 1844   PHURINE 5.0 12/13/2020 2021   GLUCOSEU NEGATIVE 12/13/2020 2021   GLUCOSEU >=500 03/01/2013 1844   HGBUR LARGE (A) 12/13/2020 2021   BILIRUBINUR NEGATIVE 12/13/2020 2021   BILIRUBINUR neg 10/23/2020 0953   BILIRUBINUR Negative 09/18/2020 1153   BILIRUBINUR Negative 03/01/2013 1844   KETONESUR 80 (A) 12/13/2020 2021   PROTEINUR 100 (A) 12/13/2020 2021   UROBILINOGEN 0.2 10/23/2020 0953   NITRITE NEGATIVE 12/13/2020 2021   LEUKOCYTESUR MODERATE (A) 12/13/2020 2021   LEUKOCYTESUR Negative 03/01/2013 1844   Sepsis Labs Invalid input(s): PROCALCITONIN,  WBC,  LACTICIDVEN Microbiology Recent Results (from the past 240 hour(s))  Resp Panel by RT-PCR (Flu A&B, Covid) Nasopharyngeal Swab     Status: None   Collection Time: 12/13/20  1:48 AM   Specimen: Nasopharyngeal Swab; Nasopharyngeal(NP) swabs in vial transport medium  Result Value Ref Range Status   SARS Coronavirus 2 by RT PCR NEGATIVE NEGATIVE Final    Comment: (NOTE) SARS-CoV-2 target nucleic acids are NOT DETECTED.  The SARS-CoV-2 RNA is generally  detectable in upper respiratory specimens during the acute phase of infection. The lowest concentration of SARS-CoV-2 viral copies this assay can detect is 138 copies/mL. A negative result does not preclude SARS-Cov-2 infection and should not be used as the sole basis for treatment or other patient management decisions. A negative result may occur with  improper specimen collection/handling, submission of specimen other than nasopharyngeal swab, presence of viral mutation(s) within the areas targeted by this assay, and inadequate number of viral copies(<138 copies/mL). A negative result must be combined with clinical observations, patient history, and epidemiological information. The expected result is Negative.  Fact Sheet for Patients:  BloggerCourse.com  Fact Sheet for Healthcare Providers:  SeriousBroker.it  This test is no t yet approved or cleared by the [place] FDA and  has been authorized for detection and/or diagnosis of SARS-CoV-2 by FDA under an Emergency Use Authorization (EUA). This EUA will remain  in effect (meaning this test can be used) for the duration of the COVID-19 declaration under Section 564(b)(1) of the Act, 21 U.S.C.section 360bbb-3(b)(1), unless the authorization is terminated  or revoked sooner.       Influenza A by PCR NEGATIVE NEGATIVE Final   Influenza B by PCR NEGATIVE NEGATIVE Final    Comment: (NOTE) The Xpert Xpress SARS-CoV-2/FLU/RSV plus assay is intended as an aid in the diagnosis of influenza from Nasopharyngeal swab specimens and should not be used as a sole basis for treatment. Nasal washings and aspirates are unacceptable for Xpert Xpress SARS-CoV-2/FLU/RSV testing.  Fact Sheet for Patients: BloggerCourse.com  Fact Sheet for Healthcare Providers: SeriousBroker.it  This test is not yet approved or cleared by the [place] FDA  and has been authorized for detection and/or diagnosis of SARS-CoV-2 by FDA under an Emergency Use Authorization (EUA). This EUA will remain in effect (meaning this test can be used) for the duration of the COVID-19 declaration under Section 564(b)(1) of the Act, 21 U.S.C. section 360bbb-3(b)(1), unless the authorization is terminated or revoked.  Performed at [facility], [address] [place], [place] [address]   MRSA Next Gen by PCR, Nasal     Status: None   Collection Time: 12/15/20  9:20  PM   Specimen: Nasal Mucosa; Nasal Swab  Result Value Ref Range Status   MRSA by PCR Next Gen NOT DETECTED NOT DETECTED Final    Comment: (NOTE) The GeneXpert MRSA Assay (FDA approved for NASAL specimens only), is one component of a comprehensive MRSA colonization surveillance program. It is not intended to diagnose MRSA infection nor to guide or monitor treatment for MRSA infections. Test performance is not FDA approved in patients less than 70 years old. Performed at [facility], [address] [place], [place] [address]      Total time spend on discharging this patient, including the last patient exam, discussing the hospital stay, instructions for ongoing care as it relates to all pertinent caregivers, as well as preparing the medical discharge records, prescriptions, and/or referrals as applicable, is 120 minutes.    Darlin Priestly, MD  [facility] 12/16/2020, 7:56 PM

## 2020-12-16 NOTE — Progress Notes (Signed)
FHT 145 at 1200. Auscultated rate-regular. Patient notified-nodded in agreement of plan. MD notified.

## 2020-12-16 NOTE — Progress Notes (Signed)
Patient transferred from [facility] to [phone]. Patient is alert and oriented to person, place, time, and situation. Telemetry monitoring enabled, vital signs taken, and IVs assessed for patency. Skin checked with [facility] RN. Fall precautions initiated. Patient bed in the locked, lowest position. Non-slip socks in place and bed alarm on. Call bell is within reach. Patient knows to call for assistance prior to getting up and patient demonstrates use. Patient is laying comfortably in bed.  On-call OBGYN and admitting attending Toniann Fail MD notified.

## 2020-12-16 NOTE — Progress Notes (Signed)
Patient ID: Ashley Camacho, female   DOB: May 31, 1990, 30 y.o.   MRN: [id number]    Subjective:    Flat affect continues.  Patient says she feels no better today.  Has no specific complaints.  Denies nausea and vomiting at present.  We had a long discussion about the difference between TPN and an NG tube.  I expressed my understanding regarding her discomfort and use of NG tube when at [facility].  The enhanced ability for feeding and for home care was discussed with her.  She said that she would consider the tube and make a decision.  Objective:    No data found. Total I/O In: 818.7 [I.V.:718.7; IV Piggyback:100] Out: 625 [Urine:625]  Labs: Results for orders placed or performed during the hospital encounter of 12/12/20 (from the past 24 hour(s))  Glucose, capillary     Status: Abnormal   Collection Time: 12/15/20  4:11 PM  Result Value Ref Range   Glucose-Capillary 178 (H) 70 - 99 mg/dL  Glucose, capillary     Status: Abnormal   Collection Time: 12/15/20  5:19 PM  Result Value Ref Range   Glucose-Capillary 170 (H) 70 - 99 mg/dL  Glucose, capillary     Status: Abnormal   Collection Time: 12/15/20  6:21 PM  Result Value Ref Range   Glucose-Capillary 171 (H) 70 - 99 mg/dL  Glucose, capillary     Status: Abnormal   Collection Time: 12/15/20  7:26 PM  Result Value Ref Range   Glucose-Capillary 140 (H) 70 - 99 mg/dL  Glucose, capillary     Status: Abnormal   Collection Time: 12/15/20  8:33 PM  Result Value Ref Range   Glucose-Capillary 137 (H) 70 - 99 mg/dL  MRSA Next Gen by PCR, Nasal     Status: None   Collection Time: 12/15/20  9:20 PM   Specimen: Nasal Mucosa; Nasal Swab  Result Value Ref Range   MRSA by PCR Next Gen NOT DETECTED NOT DETECTED  Glucose, capillary     Status: Abnormal   Collection Time: 12/15/20  9:41 PM  Result Value Ref Range   Glucose-Capillary 120 (H) 70 - 99 mg/dL  Potassium     Status: None   Collection Time: 12/15/20 11:28 PM  Result Value Ref Range    Potassium 3.5 3.5 - 5.1 mmol/L  Magnesium     Status: None   Collection Time: 12/15/20 11:28 PM  Result Value Ref Range   Magnesium 1.7 1.7 - 2.4 mg/dL  Glucose, capillary     Status: Abnormal   Collection Time: 12/16/20 12:06 AM  Result Value Ref Range   Glucose-Capillary 113 (H) 70 - 99 mg/dL  Glucose, capillary     Status: Abnormal   Collection Time: 12/16/20  3:17 AM  Result Value Ref Range   Glucose-Capillary 128 (H) 70 - 99 mg/dL  Basic metabolic panel     Status: Abnormal   Collection Time: 12/16/20  4:30 AM  Result Value Ref Range   Sodium 139 135 - 145 mmol/L   Potassium 3.5 3.5 - 5.1 mmol/L   Chloride 109 98 - 111 mmol/L   CO2 25 22 - 32 mmol/L   Glucose, Bld 143 (H) 70 - 99 mg/dL   BUN <5 (L) 6 - 20 mg/dL   Creatinine, Ser 2.50 (H) 0.44 - 1.00 mg/dL   Calcium 7.6 (L) 8.9 - 10.3 mg/dL   GFR, Estimated >03 >70 mL/min   Anion gap 5 5 - 15  Magnesium  Status: None   Collection Time: 12/16/20  4:30 AM  Result Value Ref Range   Magnesium 1.7 1.7 - 2.4 mg/dL  Phosphorus     Status: None   Collection Time: 12/16/20  4:30 AM  Result Value Ref Range   Phosphorus 3.2 2.5 - 4.6 mg/dL  Glucose, capillary     Status: Abnormal   Collection Time: 12/16/20  7:03 AM  Result Value Ref Range   Glucose-Capillary 111 (H) 70 - 99 mg/dL  Glucose, capillary     Status: Abnormal   Collection Time: 12/16/20  8:47 AM  Result Value Ref Range   Glucose-Capillary 122 (H) 70 - 99 mg/dL  Glucose, capillary     Status: Abnormal   Collection Time: 12/16/20  9:57 AM  Result Value Ref Range   Glucose-Capillary 111 (H) 70 - 99 mg/dL  Glucose, capillary     Status: Abnormal   Collection Time: 12/16/20 11:58 AM  Result Value Ref Range   Glucose-Capillary 118 (H) 70 - 99 mg/dL  Glucose, capillary     Status: None   Collection Time: 12/16/20  2:01 PM  Result Value Ref Range   Glucose-Capillary 89 70 - 99 mg/dL   Fetal heart tones normal as noted by Doppler.  Assessment/Plan:    1.   Pregnancy complicated only by patient's current medical status.  Ongoing treatment for medical issues should predominate over prenatal care at this time.  Once patient is able to tolerate an increased caloric intake we can consider future ultrasound for fetal growth etc.  No MFM consult is currently necessary as her pregnancy is not the source of her medical issues.  Certainly her acute medical care is the primary concern at this time.  2.  IDDM: - DKA -patient with very limited caloric intake prior to TPN making diabetic management difficult.  TPN as well as possible future tube feedings should certainly make management easier. (Starvation ketoacidosis)  3.  Hyperemesis -does not specifically seem pregnancy related.    4.  Chronic anemia  5.  Edema -third spacing fluids secondary to heart failure and hypoalbuminemia.  6.  Hypoxic secondary to chronic anemia, recent pneumonia, heart failure and possibly extensive third spacing of fluids.  Critical care to continue.  Patient to strongly consider placement of NG tube which would allow her to wean off of TPN and eventually be discharged home. Will continue to monitor fetal heart tones twice daily.  Consider MFM consult for fetal growth once patient not critically ill.  I spent 35 minutes involved in the care of this patient preparing to see the patient by obtaining and reviewing her medical history (including labs, imaging tests and prior procedures), documenting clinical information in the electronic health record (EHR), counseling and coordinating care plans, writing and sending prescriptions, ordering tests or procedures and directly communicating with the patient by discussing pertinent items from her history and physical exam as well as detailing my assessment and plan as noted above so that she has an informed understanding.  Elonda Husky, M.D. 12/16/2020 2:33 PM

## 2020-12-16 NOTE — Progress Notes (Signed)
ADA Standards of Care 2022 Diabetes in Pregnancy Target Glucose Ranges:  Fasting: 60 - 90 mg/dL Preprandial: 60 - 671 mg/dL 1 hr postprandial: Less than 140mg /dL (from first bite of meal) 2 hr postprandial: Less than 120 mg/dL (from first bit of meal)  Lab Results  Component Value Date   GLUCAP 111 (H) 12/16/2020   HGBA1C 5.8 (H) 11/21/2020    Review of Glycemic Control Results for Ashley Camacho, Ashley Camacho (MRN [name]) as of 12/16/2020 10:58  Ref. Range 12/16/2020 00:06 12/16/2020 03:17 12/16/2020 07:03 12/16/2020 08:47 12/16/2020 09:57  Glucose-Capillary Latest Ref Range: 70 - 99 mg/dL 12/18/2020 (H) 382 (H) 505 (H) 122 (H) 111 (H)  Diabetes history: DM 1 Outpatient Diabetes medications:  Insulin pump- basal rates at last d/c from [facility] was 0.05 units/hr which equals 1.2 units in 24 hours period, 1 unit drops blood sugar 100 mg/dL, 1 unit per 30 grams CHO Current orders for Inpatient glycemic control:  IV insulin  Inpatient Diabetes Program Recommendations:    Note insulin drip started yesterday with TPN.  Blood sugars look great!! IV insulin drip rates 0.5-1 unit/hr.  If patient switched to enteral feeds, will need to adjust insulin appropriately.  Recommend continuing IV insulin for now.   Thanks,  397, RN, BC-ADM Inpatient Diabetes Coordinator Pager [phone] (8a-5p)

## 2020-12-16 NOTE — Progress Notes (Addendum)
PROGRESS NOTE    Ashley WAUNEKA  ZOX:[id number] DOB: [birth year] DOA: 12/12/2020 PCP: Hildred Laser, MD  [id number]   Assessment & Plan:   Principal Problem:   DKA, type 1, not at goal [facility]) Active Problems:   Anemia affecting pregnancy in second trimester   Hyperemesis   Pregnancy with 24 completed weeks gestation   Sunburn   Patient is a 30 year old female G2,P1 at [redacted] weeks gestation who has a history of type 1 diabetes mellitus.  She presented to the emergency room for evaluation of persistent nausea and vomiting for 2 days.  She was recently treated at [facility] for sepsis secondary to pneumonia and was there for 2 weeks. She initially had an insulin pump but has been switched to subcutaneous Lantus and NovoLog and states that her last dose of insulin was 1 day prior to her admission because she has been unable to tolerate any oral intake.   # Starvation ketoacidosis --anion gap of 14 with a serum bicarbonate level of 12.  Beta hydroxybutyric acid is also elevated.  However, no acidosis per ABG. --started on insulin gtt with endo-tool and transitioned to subQ insulin on 7/24.   Plan: --treat starvation with TPN --insulin gtt for now with TPN  # DM1 # Hypoglycemia --transitioned off insulin gtt to 5u Lantus on 7/24, however, has been having hypoglycemic episodes throughout the day. Plan: --Hold long-acting insulin --insulin gtt for now with TPN  # Hyperemesis gravidarum Treatment plan per obstetrics --cont scheduled IV zofran  --IV phenergan and compazine PRN --cont IV Pepcid   # Severe malnutrition on TPN Secondary to poor oral intake  --Patient's albumin level is 1.8.  Has not had adequate nutrition for at least a month. --did not tolerate NG tube presence (tried at [facility]). --started TPN on 7/25. Plan: --cont Remeron 15 mg nightly  --cont TPN for now --monitor for refeeding syndrome --will transition to tube feed via transpyloric Cortrek tube  which will be [facility] at [facility].  Hypoalbuminemia  Edema due to 3rd spacing --repeat IV lasix 40 mg BID and IV albumin 25g support  # Cardiomyopathy chronic systolic heart failure 2D echocardiogram shows an LVEF of 45% Patient has bilateral lower extremity swelling and developed shortness of breath with IV fluid hydration as well as hypoxia that improved following administration of IV diuretics. Plan: --monitor for respiratory status --repeat IV lasix 40 mg BID and IV albumin 25g support   # History of right brachial DVT --Continue Lovenox 80 mg SQ twice daily per recommendations from [facility]     DVT prophylaxis: WJ:[id number] dose lovenox Code Status: Full code  Family Communication:  Level of care: Stepdown Dispo:   The patient is from: home Anticipated d/c is to: [facility] Anticipated d/c date is: whenever bed   Subjective and Interval History:  Good urine output with diuresis.  Pt reported intermittent dyspnea.  Still nauseated.  After much discussion, pt now agrees to tube feed via transpyloric placement that will be performed in [facility].  Ob Dr. [name] discussed with Dr.  Grace Bushy (mental fetal medicine) at [facility] and decision made to transfer pt to [facility].     Objective: Vitals:   12/16/20 1200 12/16/20 1300 12/16/20 1400 12/16/20 1500  BP: 138/69 (!) 149/79 139/69 (!) 142/73  Pulse: (!) 101 (!) 102 (!) 105 (!) 105  Resp: (!) 27 20 (!) 29 (!) 28  Temp: 98.4 F (36.9 C)     TempSrc: Oral     SpO2: 97% 95%  94% 95%  Weight:      Height:        Intake/Output Summary (Last 24 hours) at 12/16/2020 1830 Last data filed at 12/16/2020 1648 Gross per 24 hour  Intake 2484.78 ml  Output 4865 ml  Net -2380.22 ml   Filed Weights   12/12/20 2108  Weight: 57 kg    Examination:   Constitutional: NAD, AAOx3 HEENT: conjunctivae and lids normal, EOMI CV: No cyanosis.   RESP: normal respiratory effort, on 1L Extremities: improved edema in BLE SKIN: warm, dry Neuro: II - XII  grossly intact.   Psych: flat mood and affect.   Foley present.   Data Reviewed: I have personally reviewed following labs and imaging studies  CBC: Recent Labs  Lab 12/12/20 2111 12/13/20 0811 12/15/20 0536  WBC 7.2 8.8 7.5  NEUTROABS 4.9 8.1* 5.5  HGB 9.9* 9.5* 8.6*  HCT 28.9* 27.8* 25.4*  MCV 89.5 90.0 90.1  PLT 171 177 168   Basic Metabolic Panel: Recent Labs  Lab 12/12/20 2111 12/13/20 0811 12/13/20 2241 12/14/20 0416 12/14/20 1206 12/15/20 0536 12/15/20 2328 12/16/20 0430  NA 135   < > 138 139 138 139  --  139  K 3.6   < > 3.4* 3.2* 3.4* 3.4* 3.5 3.5  CL 109   < > 111 114* 113* 113*  --  109  CO2 16*   < > 19* 21* 23 22  --  25  GLUCOSE 142*   < > 134* 121* 100* 176*  --  143*  BUN 6   < > 6 6 5* <5*  --  <5*  CREATININE 1.09*   < > 1.25* 1.23* 1.01* 1.06*  --  1.09*  CALCIUM 7.9*   < > 7.7* 7.5* 7.5* 7.4*  --  7.6*  MG 1.6*  --   --   --   --  1.4* 1.7 1.7  PHOS  --   --   --   --   --  1.9*  --  3.2   < > = values in this interval not displayed.   GFR: Estimated Creatinine Clearance: 57.5 mL/min (A) (by C-G formula based on SCr of 1.09 mg/dL (H)). Liver Function Tests: Recent Labs  Lab 12/13/20 0811 12/15/20 0536  AST 32 25  ALT 15 12  ALKPHOS 102 78  BILITOT 3.3* 1.4*  PROT 5.2* 4.2*  ALBUMIN 1.8* 1.5*   No results for input(s): LIPASE, AMYLASE in the last 168 hours. No results for input(s): AMMONIA in the last 168 hours. Coagulation Profile: No results for input(s): INR, PROTIME in the last 168 hours. Cardiac Enzymes: No results for input(s): CKTOTAL, CKMB, CKMBINDEX, TROPONINI in the last 168 hours. BNP (last 3 results) No results for input(s): PROBNP in the last 8760 hours. HbA1C: No results for input(s): HGBA1C in the last 72 hours. CBG: Recent Labs  Lab 12/16/20 1158 12/16/20 1401 12/16/20 1508 12/16/20 1644 12/16/20 1746  GLUCAP 118* 89 117* 122* 123*   Lipid Profile: Recent Labs    12/15/20 0536  TRIG 95   Thyroid  Function Tests: No results for input(s): TSH, T4TOTAL, FREET4, T3FREE, THYROIDAB in the last 72 hours. Anemia Panel: No results for input(s): VITAMINB12, FOLATE, FERRITIN, TIBC, IRON, RETICCTPCT in the last 72 hours. Sepsis Labs: No results for input(s): PROCALCITON, LATICACIDVEN in the last 168 hours.  Recent Results (from the past 240 hour(s))  Resp Panel by RT-PCR (Flu A&B, Covid) Nasopharyngeal Swab     Status: None  Collection Time: 12/13/20  1:48 AM   Specimen: Nasopharyngeal Swab; Nasopharyngeal(NP) swabs in vial transport medium  Result Value Ref Range Status   SARS Coronavirus 2 by RT PCR NEGATIVE NEGATIVE Final    Comment: (NOTE) SARS-CoV-2 target nucleic acids are NOT DETECTED.  The SARS-CoV-2 RNA is generally detectable in upper respiratory specimens during the acute phase of infection. The lowest concentration of SARS-CoV-2 viral copies this assay can detect is 138 copies/mL. A negative result does not preclude SARS-Cov-2 infection and should not be used as the sole basis for treatment or other patient management decisions. A negative result may occur with  improper specimen collection/handling, submission of specimen other than nasopharyngeal swab, presence of viral mutation(s) within the areas targeted by this assay, and inadequate number of viral copies(<138 copies/mL). A negative result must be combined with clinical observations, patient history, and epidemiological information. The expected result is Negative.  Fact Sheet for Patients:  BloggerCourse.com  Fact Sheet for Healthcare Providers:  SeriousBroker.it  This test is no t yet approved or cleared by the [place] FDA and  has been authorized for detection and/or diagnosis of SARS-CoV-2 by FDA under an Emergency Use Authorization (EUA). This EUA will remain  in effect (meaning this test can be used) for the duration of the COVID-19 declaration under  Section 564(b)(1) of the Act, 21 U.S.C.section 360bbb-3(b)(1), unless the authorization is terminated  or revoked sooner.       Influenza A by PCR NEGATIVE NEGATIVE Final   Influenza B by PCR NEGATIVE NEGATIVE Final    Comment: (NOTE) The Xpert Xpress SARS-CoV-2/FLU/RSV plus assay is intended as an aid in the diagnosis of influenza from Nasopharyngeal swab specimens and should not be used as a sole basis for treatment. Nasal washings and aspirates are unacceptable for Xpert Xpress SARS-CoV-2/FLU/RSV testing.  Fact Sheet for Patients: BloggerCourse.com  Fact Sheet for Healthcare Providers: SeriousBroker.it  This test is not yet approved or cleared by the [place] FDA and has been authorized for detection and/or diagnosis of SARS-CoV-2 by FDA under an Emergency Use Authorization (EUA). This EUA will remain in effect (meaning this test can be used) for the duration of the COVID-19 declaration under Section 564(b)(1) of the Act, 21 U.S.C. section 360bbb-3(b)(1), unless the authorization is terminated or revoked.  Performed at [facility], [address] [place], [place] [address]   MRSA Next Gen by PCR, Nasal     Status: None   Collection Time: 12/15/20  9:20 PM   Specimen: Nasal Mucosa; Nasal Swab  Result Value Ref Range Status   MRSA by PCR Next Gen NOT DETECTED NOT DETECTED Final    Comment: (NOTE) The GeneXpert MRSA Assay (FDA approved for NASAL specimens only), is one component of a comprehensive MRSA colonization surveillance program. It is not intended to diagnose MRSA infection nor to guide or monitor treatment for MRSA infections. Test performance is not FDA approved in patients less than 29 years old. Performed at [facility], [address] [place], [place] [address]       Radiology Studies: No results found.   Scheduled Meds:  aluminum-petrolatum-zinc  1 application Topical TID    Chlorhexidine Gluconate Cloth  6 each Topical Daily   Chlorhexidine Gluconate Cloth  6 each Topical Q0600   docusate sodium  100 mg Oral Daily   enoxaparin (LOVENOX) injection  80 mg Subcutaneous Q12H   mirtazapine  15 mg Oral QHS   ondansetron (ZOFRAN) IV  4 mg Intravenous Q8H  prenatal multivitamin  1 tablet Oral Q1200   sodium chloride flush  10-40 mL Intracatheter Q12H   Continuous Infusions:  famotidine (PEPCID) IV Stopped (12/16/20 1034)   insulin 0.8 Units/hr (12/16/20 1602)   lactated ringers Stopped (12/15/20 1855)   promethazine (PHENERGAN) injection (IM or IVPB) Stopped (12/16/20 1123)   TPN ADULT (ION) 65 mL/hr at 12/16/20 1803     LOS: 3 days   Since pt was later discharged on the same day, charge for this note canceled.   Darlin Priestly, MD [facility] If 7PM-7AM, please contact night-coverage 12/16/2020, 6:30 PM

## 2020-12-16 NOTE — Progress Notes (Addendum)
Nutrition Follow-up  DOCUMENTATION CODES:   Not applicable  INTERVENTION:   TPN per pharmacy- plan to advance to goal rate tonight   Recommend placement of post-pyloric nasogastric tube and nutrition support  If nasogastric tube placed, recommend:  Osmolite 1.5 _0 /hr- Initiate at 60m/hr and increase by 175mhr q 8 hours until goal rate is reached.   Free water flushes 10048m4 hours   Regimen provides 2160kcal/day, 90g/day protein and 1697m40my free water   Prenatal MVI daily via tube   NUTRITION DIAGNOSIS:   Inadequate oral intake related to nausea as evidenced by per patient/family report, meal completion < 25%.  GOAL:   Patient will meet greater than or equal to 90% of their needs -met with TPN   MONITOR:   PO intake, Labs, Weight trends, Skin, I & O's, Other (Comment) (TPN)  ASSESSMENT:   29 y64. female in second trimester ([id number] to ED with nausea and vomiting x 2 days. Recently admitted and transferred to [facility] similar presentation. Dx with hyperemesis gravidarum. PMH includes DM type 1, gastroparesis, HTN, CHF (EF 45%), hx of DVT, and anemia.  Pt transferred to ICU for insulin drip. Met with pt in room today. Pt sleepy today after receiving phenergan. Pt reports continued nausea. Pt did not touch her breakfast tray but is sipping on some water. Pt tolerating TPN at half rate; plan is to advance to goal rate this evening. Refeed labs improved. Pt reports h/o [facility]ogastric [facility] that [facility] that she was only able to tolerate the tube for < 24 hrs and then she asked for it to be removed as it was making her gag. RD discussed with pt the importance of adequate nutrition needed to promote good fetal growth and development. RD also discussed with pt the risks of long term TPN including macrosomia, micronutrient deficiencies, liver failure, volume overload, infections and thrombosis. TPN also may not be covered by pt's insurance unless  enteral nutrition is not tolerated by patient. RD also discussed with pt the benefits to enteral nutrition including prevention of gut mucosal atrophy, reducing septic complications by decreasing bacterial translocation, stimulating gut motility therefore reducing the risk of ileus and enhancing the intestinal immune system. RD encouraged pt to reconsider post-pyloric nasogastric tube placement; RD explained that it may take several days for the irritation in the back of pt's throat to subside so that she no longer gags and that most people are able to tolerate long term nasogastric tube placement after a few days. Pt requested a few hours to reconsider nasogastric tube placement; RD will check back with pt later this evening.   Update 1600: Pt has agreed to have nasogastric tube placed. [facility] buyerwill [facility]   Medications reviewed and include: colace, lovenox, lasix, mirtazapine, zofran, pre-natal MVI, albumin, pepcid, insulin, phenergan  Labs reviewed: K 3.5 wnl, BUN <5(L), creat 1.09(H), P 3.2 wnl, Mg 1.7 wnl Triglycerides- 95- 7/25 Prealbumin- <5(L)- 7/25 Hgb 8.6(L), Hct 25.4(L) Cbgs- 128, 111, 122, 111, 118 x 24 hrs AIC 5.8(H)- 7/1  Diet Order:   Diet Order             Diet Carb Modified Fluid consistency: Thin; Room service appropriate? Yes  Diet effective now  EDUCATION NEEDS:   Not appropriate for education at this time  Skin:  Skin Assessment: Reviewed RN Assessment  Last BM:  7/25  Height:   Ht Readings from Last 1 Encounters:  12/12/20 _0  (1.549 m)    Weight:   Wt Readings from Last 1 Encounters:  12/12/20 57 kg    Ideal Body Weight:  47.7 kg  BMI:  Body mass index is 23.74 kg/m.  Estimated Nutritional Needs:   Kcal:  1900-2200kcal/day  Protein:  85-95g/day  Fluid:   1.4-1.7L/day  Koleen Distance MS, RD, LDN Please refer to [facility] for RD and/or RD on-call/weekend/after hours pager

## 2020-12-16 NOTE — H&P (Signed)
[name]:[id number] DOB: 01-04-1991 DOA: 12/16/2020     PCP: Hildred Laser, MD   Outpatient Specialists:      Patient arrived to ER on  at  Referred by Attending Darlin Priestly, MD   Patient coming from: home Lives  With family    Chief Complaint: nausea vomiting   HPI: Ashley Camacho is a 30 y.o. female with medical history significant of [redacted]w[redacted]d on presentation, Estimated Date of Delivery: 03/30/21 by by 9 week ultrasound, Type 1 diabetic, Cardiomyopathy, chronic systolic heart failure who was admitted to [facility] service initially for hyperemesis gravidarum x 2 days.  DVT in bilateral arm    Presented with   need for Corepack placement Patient has a history of recent admission to [facility] on 11/20/20 for similar complaints, unable to tolerate PO, admitted for hyperemesis.  She was treated with multiple antiemetics with only modest improvement.  She was eventually transferred to [facility] for sepsis secondary to pneumonia and was there from 7/4 to 12/10/20.   She initially had an insulin pump but has been switched to subcutaneous Lantus and NovoLog and states that her last dose of insulin was 1 day prior to her current admission because she has been unable to tolerate any oral intake.   During current hospitalization, pt was not able to have oral intake due to severe nausea and vomiting.  With almost 1 month of poor oral intake, pt had an albumin level of 1.8, was becoming edematous from 3rd spacing.  Ob Dr. [name] requested Hospitalist Medicine to take over as primary, and believed that the fetus was doing well.  Decision was made to start alternative method of providing nutrition.  Pt initially refused Dobhoff tube feed, and PEG tube was considered unsafe.  Pt was therefore started on TPN.  There was a question whether insurance would pay for in-home TPN, so pt was convince to receive a transpyloric Cortrek tube that can be placed at [facility] with minimum radiation exposure to  the fetus.     Also care team and nursing staff were concerned about pt being a high-risk ob pt, so request was made to transfer to [facility], where maternal fetal medicine Dr. Grace Bushy will see pt as consult.        Initial COVID TEST  NEGATIVE   Lab Results  Component Value Date   SARSCOV2NAA NEGATIVE 12/13/2020   SARSCOV2NAA NEGATIVE 11/21/2020   SARSCOV2NAA NEGATIVE 11/20/2020   SARSCOV2NAA NOT DETECTED 05/30/2019   Regarding pertinent Chronic problems:     CHF  systolic - last echo 11/25/20 at [facility] EF 45%      DM 1 -  Lab Results  Component Value Date   HGBA1C 5.8 (H) 11/21/2020   on insulin,   Hx of DVT  on - anticoagulation with lovenox   Chronic anemia - baseline hg Hemoglobin & Hematocrit  Recent Labs    12/12/20 2111 12/13/20 0811 12/15/20 0536  HGB 9.9* 9.5* 8.6*     While in ER:   ED Triage Vitals [12/16/20 2207]  Enc Vitals Group     BP 139/74     Pulse Rate (!) 109     Resp (!) 22     Temp 98.8 F (37.1 C)     Temp Source Oral     SpO2 94 %     Weight      Height      Head Circumference      Peak Flow  Pain Score 0     Pain Loc      Pain Edu?      Excl. in GC?   NWGN(56)@TMAX(24)@     _________________________________________ Significant initial  Findings: Abnormal Labs Reviewed  GLUCOSE, CAPILLARY - Abnormal; Notable for the following components:      Result Value   Glucose-Capillary 120 (*)    All other components within normal limits   ____________________________________________ Ordered    CXR - fluid overload      ECG: Ordered Personally reviewed by me showing: HR : 108 low amplitude Rhythm:  Sinus tachycardia    no evidence of ischemic changes QTC 447  The recent clinical data is shown below. Vitals:   12/16/20 2207  BP: 139/74  Pulse: (!) 109  Resp: (!) 22  Temp: 98.8 F (37.1 C)  TempSrc: Oral  SpO2: 94%    WBC     Component Value Date/Time   WBC 7.5 12/15/2020 0536   LYMPHSABS 1.1 12/15/2020 0536   MONOABS 0.4  12/15/2020 0536   EOSABS 0.4 12/15/2020 0536   BASOSABS 0.1 12/15/2020 0536      UA  ordered     Results for orders placed or performed during the hospital encounter of 12/12/20  Resp Panel by RT-PCR (Flu A&B, Covid) Nasopharyngeal Swab     Status: None   Collection Time: 12/13/20  1:48 AM   Specimen: Nasopharyngeal Swab; Nasopharyngeal(NP) swabs in vial transport medium  Result Value Ref Range Status   SARS Coronavirus 2 by RT PCR NEGATIVE NEGATIVE Final         Influenza A by PCR NEGATIVE NEGATIVE Final   Influenza B by PCR NEGATIVE NEGATIVE Final        MRSA Next Gen by PCR, Nasal     Status: None   Collection Time: 12/15/20  9:20 PM   Specimen: Nasal Mucosa; Nasal Swab  Result Value Ref Range Status   MRSA by PCR Next Gen NOT DETECTED NOT DETECTED Final    Comment: (NOTE) The GeneXpert MRSA Assay (FDA approved for NASAL specimens only), is one component of a comprehensive MRSA colonization surveillance program. It is not intended to diagnose MRSA infection nor to guide or monitor treatment for MRSA infections. Test performance is not FDA approved in patients less than 30 years old. Performed at [facility], [address] [place], [place] [id number]     _______________________________________________ Hospitalist was called for admission for hyperemesis gravida and anasarca  The following Work up has been ordered so far:  Orders Placed This Encounter  Procedures   Glucose, capillary     Following Medications were ordered in ER: Medications - No data to display      OTHER Significant initial  Findings:  labs showing:    Recent Labs  Lab 12/12/20 2111 12/13/20 0811 12/13/20 2241 12/14/20 0416 12/14/20 1206 12/15/20 0536 12/15/20 2328 12/16/20 0430  NA 135   < > 138 139 138 139  --  139  K 3.6   < > 3.4* 3.2* 3.4* 3.4* 3.5 3.5  CO2 16*   < > 19* 21* 23 22  --  25  GLUCOSE 142*   < > 134* 121* 100* 176*  --  143*  BUN 6   < > 6 6 5* <5*  --   <5*  CREATININE 1.09*   < > 1.25* 1.23* 1.01* 1.06*  --  1.09*  CALCIUM 7.9*   < > 7.7* 7.5* 7.5* 7.4*  --  7.6*  MG 1.6*  --   --   --   --  1.4* 1.7 1.7  PHOS  --   --   --   --   --  1.9*  --  3.2   < > = values in this interval not displayed.    Cr   stable,    Lab Results  Component Value Date   CREATININE 1.09 (H) 12/16/2020   CREATININE 1.06 (H) 12/15/2020   CREATININE 1.01 (H) 12/14/2020    Recent Labs  Lab 12/13/20 0811 12/15/20 0536  AST 32 25  ALT 15 12  ALKPHOS 102 78  BILITOT 3.3* 1.4*  PROT 5.2* 4.2*  ALBUMIN 1.8* 1.5*   Lab Results  Component Value Date   CALCIUM 7.6 (L) 12/16/2020   PHOS 3.2 12/16/2020       Plt: Lab Results  Component Value Date   PLT 168 12/15/2020     ABG    Component Value Date/Time   PHART 7.38 12/13/2020 0857   PCO2ART 20 (L) 12/13/2020 0857   PO2ART 90 12/13/2020 0857   HCO3 11.8 (L) 12/13/2020 0857   ACIDBASEDEF 11.5 (H) 12/13/2020 0857   O2SAT 96.8 12/13/2020 0857         Recent Labs  Lab 12/12/20 2111 12/13/20 0811 12/15/20 0536  WBC 7.2 8.8 7.5  NEUTROABS 4.9 8.1* 5.5  HGB 9.9* 9.5* 8.6*  HCT 28.9* 27.8* 25.4*  MCV 89.5 90.0 90.1  PLT 171 177 168    HG/HCT   stable,       Component Value Date/Time   HGB 8.6 (L) 12/15/2020 0536   HGB 12.2 03/01/2013 1844   HCT 25.4 (L) 12/15/2020 0536   HCT 35.3 03/01/2013 1844   MCV 90.1 12/15/2020 0536   MCV 84 03/01/2013 1844       BNP (last 3 results) Recent Labs    11/24/20 1026  BNP 1,108.1*    DM  labs:  HbA1C: Recent Labs    11/21/20 0649  HGBA1C 5.8*       CBG (last 3)  Recent Labs    12/16/20 1746 12/16/20 1939 12/16/20 2205  GLUCAP 123* 136* 120*    Cultures:    Component Value Date/Time   SDES  11/24/2020 0854    URINE, CATHETERIZED Performed at [facility], [address] [place] [place], [place] [address]    [facility]  11/24/2020 [phone]    NONE Performed at [facility], [address] [place], [place]  [address]    CULT  11/24/2020 0854    NO GROWTH Performed at [facility], [address] [place], [place] [address]    REPTSTATUS 11/25/2020 FINAL 11/24/2020 0854     Radiological Exams on Admission: No results found. _______________________________________________________________________________________________________ Latest  Blood pressure 139/74, pulse (!) 109, temperature 98.8 F (37.1 C), temperature source Oral, resp. rate (!) 22, SpO2 94 %.   Review of Systems:    Pertinent positives include:  fatigue,shortness of breath at rest.  dyspnea on exertion,  Constitutional:  No weight loss, night sweats, Fevers, chills,  weight loss  HEENT:  No headaches, Difficulty swallowing,Tooth/dental problems,Sore throat,  No sneezing, itching, ear ache, nasal congestion, post nasal drip,  Cardio-vascular:  No chest pain, Orthopnea, PND, anasarca, dizziness, palpitations.no Bilateral lower extremity swelling  GI:  No heartburn, indigestion, abdominal pain, nausea, vomiting, diarrhea, change in bowel habits, loss of appetite, melena, blood in stool, hematemesis Resp:  no  No excess mucus, no productive  cough, No non-productive cough, No coughing up of blood.No change in color of mucus.No wheezing. Skin:  no rash or lesions. No jaundice GU:  no dysuria, change in color of urine, no urgency or frequency. No straining to urinate.  No flank pain.  Musculoskeletal:  No joint pain or no joint swelling. No decreased range of motion. No back pain.  Psych:  No change in mood or affect. No depression or anxiety. No memory loss.  Neuro: no localizing neurological complaints, no tingling, no weakness, no double vision, no gait abnormality, no slurred speech, no confusion  All systems reviewed and apart from HOPI all are negative _______________________________________________________________________________________________ Past Medical History:   Past Medical History:  Diagnosis Date    Anemia    Diabetes mellitus without complication (HCC)    Gastroparesis    Hypertension    Type 1 diabetes (HCC)      Past Surgical History:  Procedure Laterality Date   CESAREAN SECTION     MOUTH SURGERY      Social History:  Ambulatory  independently       reports that she has never smoked. She has never used smokeless tobacco. She reports that she does not drink alcohol and does not use drugs.    Family History:   Family History  Problem Relation Age of Onset   Diabetes type I Father    CAD Father    CAD Paternal Grandmother    CAD Paternal Grandfather    Breast cancer Mother    Ovarian cancer Neg Hx    ______________________________________________________________________________________________ Allergies: No Known Allergies   Prior to Admission medications   Medication Sig Start Date End Date Taking? Authorizing Provider  aluminum-petrolatum-zinc (1-2-3 PASTE) 0.027-13.7-12.5% PSTE paste Apply 1 application topically 3 (three) times daily. 12/16/20   Darlin Priestly, MD  enoxaparin (LOVENOX) 80 MG/0.8ML injection Inject 0.8 mLs (80 mg total) into the skin every 12 (twelve) hours. 12/16/20   Darlin Priestly, MD  famotidine (PEPCID) 20-0.9 MG/50ML-% Inject 50 mLs (20 mg total) into the vein every 12 (twelve) hours. 12/16/20   Darlin Priestly, MD  ferrous sulfate 324 MG TBEC Take 324 mg by mouth.    [provider]  mirtazapine (REMERON SOL-TAB) 15 MG disintegrating tablet Take 1 tablet (15 mg total) by mouth at bedtime. 12/16/20   Darlin Priestly, MD  ondansetron [facility]) 4 MG/2ML SOLN injection Inject 2 mLs (4 mg total) into the vein every 8 (eight) hours. 12/16/20   Darlin Priestly, MD  Prenatal Vit-Fe Fumarate-FA (PRENATAL MULTIVITAMIN) TABS tablet Take 1 tablet by mouth daily at 12 noon.    [provider]  prochlorperazine (COMPAZINE) 10 MG/2ML injection Inject 2 mLs (10 mg total) into the vein every 6 (six) hours as needed. 12/16/20   Darlin Priestly, MD  sodium chloride 0.9 % SOLN 50  mL with promethazine 25 MG/ML SOLN 25 mg Inject 25 mg into the vein every 6 (six) hours as needed. 12/16/20   Darlin Priestly, MD    ___________________________________________________________________________________________________ Physical Exam: Vitals with BMI 12/16/2020 12/16/2020 12/16/2020  Height - - -  Weight - - -  BMI - - -  Systolic 139 139 098  Diastolic 74 68 68  Pulse 109 104 104     1. General:  in No  Acute distress   Chronically ill -appearing 2. Psychological: Alert and   Oriented 3. Head/ENT:   dry Mucous Membranes  Head Non traumatic, neck supple                          Normal   Dentition 4. SKIN: normal  Skin turgor,  Skin clean Dry and intact no rash 5. Heart: Regular rate and rhythm no Murmur, no Rub or gallop 6. Lungs:  no wheezes or crackles   7. Abdomen: Soft,  non-tender,  gravid bowel sounds present 8. Lower extremities: no clubbing, cyanosis, 2+ edema 9. Neurologically Grossly intact, moving all 4 extremities equally   10. MSK: Normal range of motion    Chart has been reviewed  ______________________________________________________________________________________________  Assessment/Plan 30 y.o. female with medical history significant of [redacted]w[redacted]d on presentation, Estimated Date of Delivery: 03/30/21 by by 9 week ultrasound, Type 1 diabetic, Cardiomyopathy, chronic systolic heart failure who was admitted to [facility] service initially for hyperemesis gravidarum x 2 days.  DVT in bilateral arm   Admitted for starvation ketoacidosis due to Hyperemesis gravidum   Starvation ketoacidosis vs DKA --initially anion gap of 14 with a serum bicarbonate level of 12.  Beta hydroxybutyric acid was also elevated.  However, no acidosis per ABG. --started on insulin gtt with endo-tool and transitioned to subQ insulin on 7/24.   --due to pt's inability to take in orals and initial refusal for tube feed, pt was started on TPN.  Pharm, nutrition and diabetic  coordinator managed her TPN while receiving insulin gtt to cover for the BG from TPN. Will continue      DM1 Complicated by Hypoglycemia --transitioned off insulin gtt to 5u Lantus on 7/24, however developed hypoglycemia and had to go back to insulin gtt. --Hold long-acting insulin for now --insulin gtt for now with TPN    Hyperemesis gravidarum Treatment plan per obstetrics --cont scheduled IV zofran  --IV phenergan and compazine PRN --cont IV Pepcid  - consult obGYN Spoke with Dr. Grace Bushy with MFM they will see pt in consult in AM OB/GYN Dr. Debroah Loop is aware of the pt appreciate their help in management     Severe malnutrition on TPN Secondary to poor oral intake --Patient's albumin level <2 prealbumin <5  Has not had adequate nutrition for at least a month. --did not tolerate NG tube presence (tried at [facility]). --started TPN on 7/25, and should continue until secure enteral feeding can be secured. --cont Remeron 15 mg nightly  --monitor for refeeding syndrome --will transition to tube feed via transpyloric Cortrek tube  - placed a IR consult please discuss with them in AM    Hypoalbuminemia Edema due to 3rd spacing --Has been receiving IV lasix 40 mg BID with IV albumin 25g support, with good urine output and stable Cr. Cr stable although I suspect her GFR is below her baseline may need renal consult if worsening renal function  Cardiomyopathy chronic systolic heart failure 2D echocardiogram shows an LVEF of 45% Patient has bilateral lower extremity swelling and developed shortness of breath with IV fluid hydration as well as hypoxia that improved following administration of IV diuretics. Now on 2L sats 94% --Has been receiving IV lasix 40 mg BID with IV albumin 25g support, with good urine output and stable Cr.  will continue Appreciate Cardiology consult  History of right brachial DVT --was on  Lovenox 80 mg SQ twice daily per recommendations from [facility] Will transition to  heparin per pharmacy    Acute respirtaory failure with hypoxia - currently on 2 L likely due to fluid overload obtained  CXR - administer lasix Other plan as per orders.  Anemia - obtain anemia panel in AM continue iron transfuse as needed  Pregnancy - appreciate MFM and ob/gyn consult, assess fetal tones  Cardiomegaly - on cxr could be due to CR being portable but given low voltage ECG will order repeat echo to eval for pericardial effusion  DVT prophylaxis:   transition to heparin  gtt  Code Status:    Code Status: Prior FULL CODE   as per patient   I had personally discussed CODE STATUS with patient      Family Communication:   Family not at  Bedside    Disposition Plan:     To home once workup is complete and patient is stable anticipate prolonged hospitalization  Following barriers for discharge:                            Electrolytes corrected Hypoxia stable                               Anemia corrected                             Pain controlled with PO medications                                                            Will need to be able to tolerate PO                            Will likely need home health, home O2, set up                           Will need consultants to evaluate patient prior to discharge    Would benefit from PT/OT eval prior to DC  Ordered                                       Diabetes care coordinator                   Transition of care consulted                   Nutrition    consulted                 Consults called: MFM is aware, OBGYN is aware, emailed cardiology    May need renal consult if renal function worsens     inpatient     I Expect 2 midnight stay secondary to severity of patient's current illness need for inpatient interventions justified by the following:  hemodynamic instability despite optimal treatment (tachycardia  hypoxia,   Severe lab/radiological/exam abnormalities including:    Chf, anasarca, anemia and  extensive comorbidities including:   DM1  CHF   That are currently affecting medical management.   I expect  patient to be hospitalized for 2 midnights requiring inpatient medical care.  Patient is at high risk for adverse outcome (such  as loss of life or disability) if not treated.  Indication for inpatient stay as follows:    Hemodynamic instability despite maximal medical therapy,    severe pain requiring acute inpatient management,    New or worsening hypoxia  Need for TPN, insulin drip, heparin drip    Level of care  progressive  tele indefinitely please discontinue once patient no longer qualifies COVID-19 Labs    Lab Results  Component Value Date   SARSCOV2NAA NEGATIVE 12/13/2020     Precautions: admitted as  Covid Negative   PPE: Used by the provider:   N95  eye Goggles,  Gloves     Haylin Camilli 12/17/2020, 12:47 AM    [facility]     after 2 AM please page floor coverage PA If 7AM-7PM, please contact the day team taking care of the patient using [email or web address]   Patient was evaluated in the context of the global COVID-19 pandemic, which necessitated consideration that the patient might be at risk for infection with the SARS-CoV-2 virus that causes COVID-19. Institutional protocols and algorithms that pertain to the evaluation of patients at risk for COVID-19 are in a state of rapid change based on information released by regulatory bodies including the CDC and federal and state organizations. These policies and algorithms were followed during the patient's care.

## 2020-12-17 ENCOUNTER — Inpatient Hospital Stay (HOSPITAL_COMMUNITY): Payer: Medicaid Other

## 2020-12-17 ENCOUNTER — Inpatient Hospital Stay: Payer: Self-pay

## 2020-12-17 ENCOUNTER — Encounter (HOSPITAL_COMMUNITY): Payer: Self-pay | Admitting: Internal Medicine

## 2020-12-17 DIAGNOSIS — I5021 Acute systolic (congestive) heart failure: Secondary | ICD-10-CM

## 2020-12-17 DIAGNOSIS — E1143 Type 2 diabetes mellitus with diabetic autonomic (poly)neuropathy: Secondary | ICD-10-CM

## 2020-12-17 DIAGNOSIS — K3184 Gastroparesis: Secondary | ICD-10-CM

## 2020-12-17 DIAGNOSIS — R1110 Vomiting, unspecified: Secondary | ICD-10-CM

## 2020-12-17 DIAGNOSIS — E11319 Type 2 diabetes mellitus with unspecified diabetic retinopathy without macular edema: Secondary | ICD-10-CM | POA: Diagnosis present

## 2020-12-17 DIAGNOSIS — I82621 Acute embolism and thrombosis of deep veins of right upper extremity: Secondary | ICD-10-CM | POA: Diagnosis present

## 2020-12-17 DIAGNOSIS — J9601 Acute respiratory failure with hypoxia: Secondary | ICD-10-CM | POA: Diagnosis present

## 2020-12-17 DIAGNOSIS — Z3A25 25 weeks gestation of pregnancy: Secondary | ICD-10-CM | POA: Diagnosis not present

## 2020-12-17 DIAGNOSIS — F419 Anxiety disorder, unspecified: Secondary | ICD-10-CM | POA: Insufficient documentation

## 2020-12-17 DIAGNOSIS — I5042 Chronic combined systolic (congestive) and diastolic (congestive) heart failure: Secondary | ICD-10-CM

## 2020-12-17 DIAGNOSIS — I5043 Acute on chronic combined systolic (congestive) and diastolic (congestive) heart failure: Secondary | ICD-10-CM

## 2020-12-17 LAB — COMPREHENSIVE METABOLIC PANEL
ALT: 10 U/L (ref 0–44)
AST: 16 U/L (ref 15–41)
Albumin: 2.6 g/dL — ABNORMAL LOW (ref 3.5–5.0)
Alkaline Phosphatase: 72 U/L (ref 38–126)
Anion gap: 8 (ref 5–15)
BUN: <5 mg/dL — ABNORMAL LOW (ref 6–20)
CO2: 25 mmol/L (ref 22–32)
Calcium: 7.9 mg/dL — ABNORMAL LOW (ref 8.9–10.3)
Chloride: 105 mmol/L (ref 98–111)
Creatinine, Ser: 1.03 mg/dL — ABNORMAL HIGH (ref 0.44–1.00)
GFR, Estimated: >60 mL/min (ref >60)
Glucose, Bld: 207 mg/dL — ABNORMAL HIGH (ref 70–99)
Potassium: 3.6 mmol/L (ref 3.5–5.1)
Sodium: 138 mmol/L (ref 135–145)
Total Bilirubin: 1 mg/dL (ref 0.3–1.2)
Total Protein: 4.9 g/dL — ABNORMAL LOW (ref 6.5–8.1)

## 2020-12-17 LAB — BASIC METABOLIC PANEL
Anion gap: 5 (ref 5–15)
BUN: 8 mg/dL (ref 6–20)
CO2: 28 mmol/L (ref 22–32)
Calcium: 8.1 mg/dL — ABNORMAL LOW (ref 8.9–10.3)
Chloride: 104 mmol/L (ref 98–111)
Creatinine, Ser: 0.49 mg/dL (ref 0.44–1.00)
GFR, Estimated: >60 mL/min (ref >60)
Glucose, Bld: 167 mg/dL — ABNORMAL HIGH (ref 70–99)
Potassium: 3.5 mmol/L (ref 3.5–5.1)
Sodium: 137 mmol/L (ref 135–145)

## 2020-12-17 LAB — RETICULOCYTES
Immature Retic Fract: 14.7 % (ref 2.3–15.9)
RBC.: 2.48 MIL/uL — ABNORMAL LOW (ref 3.87–5.11)
Retic Count, Absolute: 46.6 10*3/uL (ref 19.0–186.0)
Retic Ct Pct: 1.9 % (ref 0.4–3.1)

## 2020-12-17 LAB — FERRITIN: Ferritin: 681 ng/mL — ABNORMAL HIGH (ref 11–307)

## 2020-12-17 LAB — TSH: TSH: 3.907 u[IU]/mL (ref 0.350–4.500)

## 2020-12-17 LAB — BETA-HYDROXYBUTYRIC ACID: Beta-Hydroxybutyric Acid: 0.08 mmol/L (ref 0.05–0.27)

## 2020-12-17 LAB — URINALYSIS, ROUTINE W REFLEX MICROSCOPIC
Bilirubin Urine: NEGATIVE
Glucose, UA: NEGATIVE mg/dL
Ketones, ur: NEGATIVE mg/dL
Nitrite: NEGATIVE
Protein, ur: 30 mg/dL — AB
Specific Gravity, Urine: 1.009 (ref 1.005–1.030)
pH: 5 (ref 5.0–8.0)

## 2020-12-17 LAB — CBC WITH DIFFERENTIAL/PLATELET
Abs Immature Granulocytes: 0.02 10*3/uL (ref 0.00–0.07)
Basophils Absolute: 0 10*3/uL (ref 0.0–0.1)
Basophils Relative: 1 %
Eosinophils Absolute: 0.4 10*3/uL (ref 0.0–0.5)
Eosinophils Relative: 6 %
HCT: 22.3 % — ABNORMAL LOW (ref 36.0–46.0)
Hemoglobin: 7.5 g/dL — ABNORMAL LOW (ref 12.0–15.0)
Immature Granulocytes: 0 %
Lymphocytes Relative: 17 %
Lymphs Abs: 1.1 10*3/uL (ref 0.7–4.0)
MCH: 30.2 pg (ref 26.0–34.0)
MCHC: 33.6 g/dL (ref 30.0–36.0)
MCV: 89.9 fL (ref 80.0–100.0)
Monocytes Absolute: 0.4 10*3/uL (ref 0.1–1.0)
Monocytes Relative: 6 %
Neutro Abs: 4.3 10*3/uL (ref 1.7–7.7)
Neutrophils Relative %: 70 %
Platelets: 177 10*3/uL (ref 150–400)
RBC: 2.48 MIL/uL — ABNORMAL LOW (ref 3.87–5.11)
RDW: 13.5 % (ref 11.5–15.5)
WBC: 6.1 10*3/uL (ref 4.0–10.5)
nRBC: 0 % (ref 0.0–0.2)

## 2020-12-17 LAB — GLUCOSE, CAPILLARY
Glucose-Capillary: 123 mg/dL — ABNORMAL HIGH (ref 70–99)
Glucose-Capillary: 124 mg/dL — ABNORMAL HIGH (ref 70–99)
Glucose-Capillary: 124 mg/dL — ABNORMAL HIGH (ref 70–99)
Glucose-Capillary: 141 mg/dL — ABNORMAL HIGH (ref 70–99)
Glucose-Capillary: 141 mg/dL — ABNORMAL HIGH (ref 70–99)
Glucose-Capillary: 142 mg/dL — ABNORMAL HIGH (ref 70–99)
Glucose-Capillary: 144 mg/dL — ABNORMAL HIGH (ref 70–99)
Glucose-Capillary: 145 mg/dL — ABNORMAL HIGH (ref 70–99)
Glucose-Capillary: 148 mg/dL — ABNORMAL HIGH (ref 70–99)
Glucose-Capillary: 158 mg/dL — ABNORMAL HIGH (ref 70–99)
Glucose-Capillary: 165 mg/dL — ABNORMAL HIGH (ref 70–99)
Glucose-Capillary: 173 mg/dL — ABNORMAL HIGH (ref 70–99)
Glucose-Capillary: 174 mg/dL — ABNORMAL HIGH (ref 70–99)
Glucose-Capillary: 184 mg/dL — ABNORMAL HIGH (ref 70–99)
Glucose-Capillary: 191 mg/dL — ABNORMAL HIGH (ref 70–99)
Glucose-Capillary: 196 mg/dL — ABNORMAL HIGH (ref 70–99)
Glucose-Capillary: 211 mg/dL — ABNORMAL HIGH (ref 70–99)
Glucose-Capillary: 75 mg/dL (ref 70–99)
Glucose-Capillary: 79 mg/dL (ref 70–99)

## 2020-12-17 LAB — CBC
HCT: 21.2 % — ABNORMAL LOW (ref 36.0–46.0)
Hemoglobin: 7 g/dL — ABNORMAL LOW (ref 12.0–15.0)
MCH: 29.8 pg (ref 26.0–34.0)
MCHC: 33 g/dL (ref 30.0–36.0)
MCV: 90.2 fL (ref 80.0–100.0)
Platelets: 198 10*3/uL (ref 150–400)
RBC: 2.35 MIL/uL — ABNORMAL LOW (ref 3.87–5.11)
RDW: 13.4 % (ref 11.5–15.5)
WBC: 5.7 10*3/uL (ref 4.0–10.5)
nRBC: 0 % (ref 0.0–0.2)

## 2020-12-17 LAB — ECHOCARDIOGRAM COMPLETE
Area-P 1/2: 4.8 cm2
S' Lateral: 3.5 cm

## 2020-12-17 LAB — VITAMIN B12: Vitamin B-12: 1146 pg/mL — ABNORMAL HIGH (ref 180–914)

## 2020-12-17 LAB — MAGNESIUM: Magnesium: 1.4 mg/dL — ABNORMAL LOW (ref 1.7–2.4)

## 2020-12-17 LAB — PREPARE RBC (CROSSMATCH)

## 2020-12-17 LAB — CK
Total CK: 54 U/L (ref 38–234)
Total CK: 55 U/L (ref 38–234)

## 2020-12-17 LAB — FOLATE: Folate: 19.8 ng/mL (ref >5.9)

## 2020-12-17 LAB — BRAIN NATRIURETIC PEPTIDE: B Natriuretic Peptide: 2128.9 pg/mL — ABNORMAL HIGH (ref 0.0–100.0)

## 2020-12-17 LAB — IRON AND TIBC
Iron: 50 ug/dL (ref 28–170)
Saturation Ratios: 40 % — ABNORMAL HIGH (ref 10.4–31.8)
TIBC: 125 ug/dL — ABNORMAL LOW (ref 250–450)
UIBC: 75 ug/dL

## 2020-12-17 LAB — SODIUM, URINE, RANDOM: Sodium, Ur: 134 mmol/L

## 2020-12-17 LAB — PREALBUMIN: Prealbumin: 5.8 mg/dL — ABNORMAL LOW (ref 18–38)

## 2020-12-17 LAB — CREATININE, URINE, RANDOM: Creatinine, Urine: 54.33 mg/dL

## 2020-12-17 LAB — PHOSPHORUS: Phosphorus: 3.7 mg/dL (ref 2.5–4.6)

## 2020-12-17 MED ORDER — SODIUM CHLORIDE 0.9% FLUSH
10.0000 mL | INTRAVENOUS | Status: DC | PRN
  Administered 2020-12-19: 10 mL
  Administered 2020-12-20 (×3): 20 mL
  Administered 2020-12-31 – 2021-01-08 (×4): 10 mL

## 2020-12-17 MED ORDER — HYDRALAZINE HCL 10 MG PO TABS
10.0000 mg | ORAL_TABLET | Freq: Three times a day (TID) | ORAL | Status: DC
  Administered 2020-12-17 – 2020-12-21 (×11): 10 mg
  Filled 2020-12-17 (×11): qty 1

## 2020-12-17 MED ORDER — GUAIFENESIN-DM 100-10 MG/5ML PO SYRP: 10.0000 mL | ORAL_SOLUTION | Freq: Four times a day (QID) | ORAL | Status: DC | PRN

## 2020-12-17 MED ORDER — SODIUM CHLORIDE 0.9% IV SOLUTION: Freq: Once | INTRAVENOUS | Status: AC

## 2020-12-17 MED ORDER — ALBUMIN HUMAN 25 % IV SOLN
25.0000 g | Freq: Two times a day (BID) | INTRAVENOUS | Status: DC
  Administered 2020-12-17 – 2020-12-19 (×6): 25 g via INTRAVENOUS
  Filled 2020-12-17 (×7): qty 100

## 2020-12-17 MED ORDER — TRACE MINERALS CU-MN-SE-ZN 300-55-60-3000 MCG/ML IV SOLN
INTRAVENOUS | Status: AC
  Filled 2020-12-17: qty 634.4

## 2020-12-17 MED ORDER — FUROSEMIDE 10 MG/ML IJ SOLN
40.0000 mg | Freq: Two times a day (BID) | INTRAMUSCULAR | Status: DC
  Administered 2020-12-17 (×3): 40 mg via INTRAVENOUS
  Filled 2020-12-17 (×3): qty 4

## 2020-12-17 MED ORDER — ENOXAPARIN SODIUM 80 MG/0.8ML IJ SOSY
80.0000 mg | PREFILLED_SYRINGE | Freq: Two times a day (BID) | INTRAMUSCULAR | Status: DC
  Filled 2020-12-17: qty 0.8

## 2020-12-17 MED ORDER — ENOXAPARIN SODIUM 80 MG/0.8ML IJ SOSY: 60.0000 mg | PREFILLED_SYRINGE | Freq: Two times a day (BID) | INTRAMUSCULAR | Status: DC

## 2020-12-17 MED ORDER — FUROSEMIDE 10 MG/ML IJ SOLN
20.0000 mg | Freq: Once | INTRAMUSCULAR | Status: AC
  Administered 2020-12-17: 20 mg via INTRAVENOUS
  Filled 2020-12-17: qty 2

## 2020-12-17 MED ORDER — VITAL HIGH PROTEIN PO LIQD: 1000.0000 mL | ORAL | Status: DC

## 2020-12-17 MED ORDER — SODIUM CHLORIDE 0.9% FLUSH
10.0000 mL | Freq: Two times a day (BID) | INTRAVENOUS | Status: DC
  Administered 2020-12-18 – 2020-12-26 (×18): 10 mL
  Administered 2020-12-27: 30 mL
  Administered 2020-12-27: 10 mL
  Administered 2020-12-28: 40 mL
  Administered 2020-12-28 – 2020-12-31 (×6): 10 mL
  Administered 2021-01-01: 30 mL
  Administered 2021-01-01 – 2021-01-04 (×7): 10 mL
  Administered 2021-01-05: 30 mL
  Administered 2021-01-05: 10 mL
  Administered 2021-01-06: 20 mL
  Administered 2021-01-06 – 2021-01-07 (×2): 10 mL
  Administered 2021-01-08: 13 mL
  Administered 2021-01-08 – 2021-01-09 (×2): 10 mL
  Administered 2021-01-09: 20 mL
  Administered 2021-01-10 – 2021-01-11 (×5): 10 mL
  Administered 2021-01-12: 15 mL
  Administered 2021-01-12 – 2021-01-15 (×6): 10 mL

## 2020-12-17 MED ORDER — CHLORHEXIDINE GLUCONATE CLOTH 2 % EX PADS
6.0000 | MEDICATED_PAD | Freq: Every day | CUTANEOUS | Status: DC
Start: 2020-12-17 — End: 2021-01-16
  Administered 2020-12-17 – 2021-01-15 (×30): 6 via TOPICAL

## 2020-12-17 MED ORDER — MAGNESIUM SULFATE 4 GM/100ML IV SOLN
4.0000 g | INTRAVENOUS | Status: AC
  Administered 2020-12-17: 4 g via INTRAVENOUS
  Filled 2020-12-17: qty 100

## 2020-12-17 MED ORDER — HEPARIN (PORCINE) 25000 UT/250ML-% IV SOLN: 1000.0000 [IU]/h | INTRAVENOUS | Status: DC

## 2020-12-17 MED ORDER — OSMOLITE 1.5 CAL PO LIQD
1000.0000 mL | ORAL | Status: DC
  Administered 2020-12-17 – 2021-01-13 (×30): 1000 mL
  Filled 2020-12-17 (×35): qty 1000

## 2020-12-17 MED ORDER — ENOXAPARIN SODIUM 80 MG/0.8ML IJ SOSY
60.0000 mg | PREFILLED_SYRINGE | Freq: Two times a day (BID) | INTRAMUSCULAR | Status: DC
  Administered 2020-12-17: 60 mg via SUBCUTANEOUS
  Filled 2020-12-17: qty 0.8

## 2020-12-17 MED ORDER — PROSOURCE TF PO LIQD
45.0000 mL | Freq: Two times a day (BID) | ORAL | Status: DC
  Administered 2020-12-18 – 2021-01-13 (×54): 45 mL
  Filled 2020-12-17 (×59): qty 45

## 2020-12-17 MED ORDER — PANTOPRAZOLE SODIUM 40 MG IV SOLR
40.0000 mg | Freq: Two times a day (BID) | INTRAVENOUS | Status: DC
  Administered 2020-12-17 – 2021-01-02 (×32): 40 mg via INTRAVENOUS
  Filled 2020-12-17 (×32): qty 40

## 2020-12-17 MED ORDER — MAGNESIUM SULFATE IN D5W 1-5 GM/100ML-% IV SOLN
1.0000 g | Freq: Once | INTRAVENOUS | Status: DC
Start: 2020-12-17 — End: 2020-12-17
  Filled 2020-12-17: qty 100

## 2020-12-17 NOTE — Progress Notes (Signed)
Peripherally Inserted Central Catheter Placement  The IV Nurse has discussed with the patient and/or persons authorized to consent for the patient, the purpose of this procedure and the potential benefits and risks involved with this procedure.  The benefits include less needle sticks, lab draws from the catheter, and the patient may be discharged home with the catheter. Risks include, but not limited to, infection, bleeding, blood clot (thrombus formation), and puncture of an artery; nerve damage and irregular heartbeat and possibility to perform a PICC exchange if needed/ordered by physician.  Alternatives to this procedure were also discussed.  Bard Power PICC patient education guide, fact sheet on infection prevention and patient information card has been provided to patient /or left at bedside.    PICC Placement Documentation  PICC Triple Lumen 12/17/20 PICC Right Basilic 37 cm 0 cm (Active)  Indication for Insertion or Continuance of Line Administration of hyperosmolar/irritating solutions (i.e. TPN, Vancomycin, etc.) 12/17/20 1846  Exposed Catheter (cm) 0 cm 12/17/20 1846  Site Assessment Clean;Dry;Intact 12/17/20 1846  Lumen #1 Status Flushed;Blood return noted 12/17/20 1846  Lumen #2 Status Flushed;Blood return noted 12/17/20 1846  Lumen #3 Status Flushed;Blood return noted 12/17/20 1846  Dressing Type Transparent 12/17/20 1846  Dressing Status Clean;Dry;Intact 12/17/20 1846  Antimicrobial disc in place? Yes 12/17/20 1846  Dressing Intervention New dressing;Other (Comment) 12/17/20 1846  Dressing Change Due 12/24/20 12/17/20 1846    PICC exchange. Used old consent   Ashley Camacho 12/17/2020, 6:47 PM

## 2020-12-17 NOTE — Progress Notes (Signed)
ANTICOAGULATION CONSULT NOTE - Follow Up Consult  Pharmacy Consult for Lovenox Indication: VTE treatment,  right brachial DVT  No Known Allergies  Patient Measurements: Height: 5\' 1"  (154.9 cm) Weight: 57 kg (125 lb 10.6 oz) IBW/kg (Calculated) : 47.8 Lovenox Dosing Weight: 57 kg  Vital Signs: Temp: 98.7 F (37.1 C) (07/27 1304) Temp Source: Oral (07/27 1304) BP: 147/69 (07/27 1304) Pulse Rate: 104 (07/27 1304)  Labs: Recent Labs    12/15/20 0536 12/16/20 0430 12/17/20 0049  HGB 8.6*  --  7.5*  HCT 25.4*  --  22.3*  PLT 168  --  177  CREATININE 1.06* 1.09* 1.03*  CKTOTAL  --   --  54  55    Estimated Creatinine Clearance: 60.8 mL/min (A) (by C-G formula based on SCr of 1.03 mg/dL (H)).  Assessment:  30 yr old female with recent right brachial DVT.  Was on Lovenox 80 mg SQ q12h at [facility] and initially continued on transfer to [facility] 7/26 pm with last dose at 2327 on 7/26.  Had planned to transition to IV heparin this morning, about 12 hours after last Lovenox dose, but now transitioned back to Lovenox. No IV heparin given. Discussed with Dr. 8/26. Unsure why Lovenox dose was >1 mg/kg, but will adjust to 1 mg/kg going forward.  Hgb has trended down some, no bleeding reported.  Goal of Therapy:  Anti-Xa level 0.6-1 units/ml 4hrs after LMWH dose given Monitor platelets by anticoagulation protocol: Yes   Plan:  Lovenox 60 mg SQ q12h Begun ~2pm. Will schedule next dose at 4am on 7/28, then 6pm/6am to begin 7/28 pm.  Monitor for any signs/symptoms of bleeding.  Intermittent CBC.  8/28, Dennie Fetters 12/17/2020,4:13 PM

## 2020-12-17 NOTE — Progress Notes (Signed)
PROGRESS NOTE    Ashley Camacho  YQM:[id number] DOB: 1991-02-26 DOA: 12/16/2020 PCP: Hildred Laser, MD  [phone]   Assessment & Plan:   Active Problems:   Anemia affecting pregnancy in second trimester   AKI (acute kidney injury) (HCC)   Hyperemesis   Pregnancy with 24 completed weeks gestation   DKA, type 1, not at goal [facility])   Starvation ketoacidosis   Acute deep vein thrombosis (DVT) of brachial vein of right upper extremity (HCC)   Acute respiratory failure with hypoxia (HCC)   Vitreous hemorrhage of right eye (HCC)   Gastroparesis due to DM (HCC)   Diabetic retinopathy (HCC)   Patient is a 30 year old female G2,P1 at [redacted] weeks gestation who has a history of type 1 diabetes mellitus.  She presented to the emergency room for evaluation of persistent nausea and vomiting .  She was recently with multiple hospitalization, at [facility] for sepsis secondary to pneumonia and was there for 2 weeks.  Couple hospitalization at [facility] due to intractable nausea and vomiting. -Transferred from [facility] to [facility] 7/26 given her severe malnutrition, with hyperemesis gravidarum, for consideration of cortrak tube insertion.   Severe protein calorie malnutrition/hyperemesis gravidarum -Patient unable to have an oral intake for some time now, she remains on TPN. -She had core track inserted this a.m., will confirm placement by x-ray, and then can start on tube feed.  Nutritionist sent. -We will monitor closely for refeeding syndrome. -Continue with TPN till her tube feed intake is reliable -She is to receive some albumin -Continue Remeron  Type 1 diabetes mellitus insulin-dependent, uncontrolled with hypoglycemia. -Given patient oral intake is unreliable, and her CBG level fluctuating significantly, she will be kept on insulin drip for now.  Hyperemesis gravidarum -Treatment per OB team  Cardiomyopathy/chronic systolic heart failure 2D echocardiogram shows an LVEF of  45% -With lower extremity edema, likely combination of both CHF exacerbation and third spacing from hypoalbuminemia -Giving IV albumin -Continue with IV diuresis -Cardiology consulted regarding further recommendations.  History of right brachial DVT -Continue with full dose anticoagulation Lovenox   Hypomagnesemia -Repleted  Normocytic anemia -In the setting of pregnancy  Patient admitted with Foley catheter, will discontinue.   DVT prophylaxis: UV:[id number] dose lovenox Code Status: Full code  Family Communication: none at bedside Level of care: Antepartum Dispo:   The patient is from: transfer from [facility]  Remains inpatient appropriate because:Inpatient level of care appropriate due to severity of illness   Dispo: The patient is from: [facility]              Anticipated d/c is to: Home              Patient currently is not medically stable to d/c.              Difficult to place patient No   Subjective :  Patient denies chest pain, shortness of breath, she reports some fatigue, and poor night sleep, she still having nausea and vomiting.   Objective: Vitals:   12/17/20 0333 12/17/20 0400 12/17/20 0600 12/17/20 0829  BP: (!) 149/70 (!) 134/59 122/66 128/76  Pulse: (!) 105 (!) 103 (!) 102 (!) 104  Resp: (!) 27 (!) 32 (!) 24 18  Temp: 99.3 F (37.4 C) 98.7 F (37.1 C) 98 F (36.7 C) 98.6 F (37 C)  TempSrc: Oral Oral Oral Oral  SpO2: 97% 97% 99% 97%    Intake/Output Summary (Last 24 hours) at 12/17/2020 1258 Last data filed at  12/17/2020 0530 Gross per 24 hour  Intake 160.08 ml  Output 2250 ml  Net -2089.92 ml   There were no vitals filed for this visit.   Examination:   Awake Alert, Oriented X 3, frail, sleepy, but wakes up and answer questions appropriately Symmetrical Chest wall movement, Good air movement bilaterally, CTAB RRR,No Gallops,Rubs or new Murmurs, No Parasternal Heave +ve B.Sounds, Abd Soft, No tenderness, No rebound - guarding or rigidity. No  Cyanosis, Clubbing .+1  edema, No new Rash or bruise       Data Reviewed: I have personally reviewed following labs and imaging studies  CBC: Recent Labs  Lab 12/12/20 2111 12/13/20 0811 12/15/20 0536 12/17/20 0049  WBC 7.2 8.8 7.5 6.1  NEUTROABS 4.9 8.1* 5.5 4.3  HGB 9.9* 9.5* 8.6* 7.5*  HCT 28.9* 27.8* 25.4* 22.3*  MCV 89.5 90.0 90.1 89.9  PLT 171 177 168 177   Basic Metabolic Panel: Recent Labs  Lab 12/12/20 2111 12/13/20 0811 12/14/20 0416 12/14/20 1206 12/15/20 0536 12/15/20 2328 12/16/20 0430 12/17/20 0049  NA 135   < > 139 138 139  --  139 138  K 3.6   < > 3.2* 3.4* 3.4* 3.5 3.5 3.6  CL 109   < > 114* 113* 113*  --  109 105  CO2 16*   < > 21* 23 22  --  25 25  GLUCOSE 142*   < > 121* 100* 176*  --  143* 207*  BUN 6   < > 6 5* <5*  --  <5* <5*  CREATININE 1.09*   < > 1.23* 1.01* 1.06*  --  1.09* 1.03*  CALCIUM 7.9*   < > 7.5* 7.5* 7.4*  --  7.6* 7.9*  MG 1.6*  --   --   --  1.4* 1.7 1.7 1.4*  PHOS  --   --   --   --  1.9*  --  3.2 3.7   < > = values in this interval not displayed.   GFR: Estimated Creatinine Clearance: 60.8 mL/min (A) (by C-G formula based on SCr of 1.03 mg/dL (H)). Liver Function Tests: Recent Labs  Lab 12/13/20 0811 12/15/20 0536 12/17/20 0049  AST 32 25 16  ALT 15 12 10   ALKPHOS 102 78 72  BILITOT 3.3* 1.4* 1.0  PROT 5.2* 4.2* 4.9*  ALBUMIN 1.8* 1.5* 2.6*   No results for input(s): LIPASE, AMYLASE in the last 168 hours. No results for input(s): AMMONIA in the last 168 hours. Coagulation Profile: No results for input(s): INR, PROTIME in the last 168 hours. Cardiac Enzymes: Recent Labs  Lab 12/17/20 0049  CKTOTAL 54  55   BNP (last 3 results) No results for input(s): PROBNP in the last 8760 hours. HbA1C: No results for input(s): HGBA1C in the last 72 hours. CBG: Recent Labs  Lab 12/17/20 0633 12/17/20 0724 12/17/20 0837 12/17/20 1035 12/17/20 1058  GLUCAP 124* 123* 124* 75 79   Lipid Profile: Recent Labs     12/15/20 0536  TRIG 95   Thyroid Function Tests: Recent Labs    12/17/20 0049  TSH 3.907   Anemia Panel: Recent Labs    12/17/20 0049  VITAMINB12 1,146*  FOLATE 19.8  FERRITIN 681*  TIBC 125*  IRON 50  RETICCTPCT 1.9   Sepsis Labs: No results for input(s): PROCALCITON, LATICACIDVEN in the last 168 hours.  Recent Results (from the past 240 hour(s))  Resp Panel by RT-PCR (Flu A&B, Covid) Nasopharyngeal Swab  Status: None   Collection Time: 12/13/20  1:48 AM   Specimen: Nasopharyngeal Swab; Nasopharyngeal(NP) swabs in vial transport medium  Result Value Ref Range Status   SARS Coronavirus 2 by RT PCR NEGATIVE NEGATIVE Final    Comment: (NOTE) SARS-CoV-2 target nucleic acids are NOT DETECTED.  The SARS-CoV-2 RNA is generally detectable in upper respiratory specimens during the acute phase of infection. The lowest concentration of SARS-CoV-2 viral copies this assay can detect is 138 copies/mL. A negative result does not preclude SARS-Cov-2 infection and should not be used as the sole basis for treatment or other patient management decisions. A negative result may occur with  improper specimen collection/handling, submission of specimen other than nasopharyngeal swab, presence of viral mutation(s) within the areas targeted by this assay, and inadequate number of viral copies(<138 copies/mL). A negative result must be combined with clinical observations, patient history, and epidemiological information. The expected result is Negative.  Fact Sheet for Patients:  BloggerCourse.com  Fact Sheet for Healthcare Providers:  SeriousBroker.it  This test is no t yet approved or cleared by the [place] FDA and  has been authorized for detection and/or diagnosis of SARS-CoV-2 by FDA under an Emergency Use Authorization (EUA). This EUA will remain  in effect (meaning this test can be used) for the duration of the COVID-19  declaration under Section 564(b)(1) of the Act, 21 U.S.C.section 360bbb-3(b)(1), unless the authorization is terminated  or revoked sooner.       Influenza A by PCR NEGATIVE NEGATIVE Final   Influenza B by PCR NEGATIVE NEGATIVE Final    Comment: (NOTE) The Xpert Xpress SARS-CoV-2/FLU/RSV plus assay is intended as an aid in the diagnosis of influenza from Nasopharyngeal swab specimens and should not be used as a sole basis for treatment. Nasal washings and aspirates are unacceptable for Xpert Xpress SARS-CoV-2/FLU/RSV testing.  Fact Sheet for Patients: BloggerCourse.com  Fact Sheet for Healthcare Providers: SeriousBroker.it  This test is not yet approved or cleared by the [place] FDA and has been authorized for detection and/or diagnosis of SARS-CoV-2 by FDA under an Emergency Use Authorization (EUA). This EUA will remain in effect (meaning this test can be used) for the duration of the COVID-19 declaration under Section 564(b)(1) of the Act, 21 U.S.C. section 360bbb-3(b)(1), unless the authorization is terminated or revoked.  Performed at [facility], [address] [place], [place] [address]   MRSA Next Gen by PCR, Nasal     Status: None   Collection Time: 12/15/20  9:20 PM   Specimen: Nasal Mucosa; Nasal Swab  Result Value Ref Range Status   MRSA by PCR Next Gen NOT DETECTED NOT DETECTED Final    Comment: (NOTE) The GeneXpert MRSA Assay (FDA approved for NASAL specimens only), is one component of a comprehensive MRSA colonization surveillance program. It is not intended to diagnose MRSA infection nor to guide or monitor treatment for MRSA infections. Test performance is not FDA approved in patients less than 40 years old. Performed at [facility], [address] [place], [place] [address]       Radiology Studies: DG CHEST PORT 1 VIEW  Result Date: 12/17/2020 CLINICAL DATA:  Radiograph  12/13/2020 EXAM: PORTABLE CHEST 1 VIEW COMPARISON:  CT 11/22/2020 FINDINGS: Perihilar and basilar predominant hazy and patchy opacities with pulmonary vascular congestion and cuffing most consistent with pulmonary edema. Layering bilateral pleural effusions including fluid tracking in the right fissures. No pneumothorax. Cardiac size is similar to comparison prior. Left upper extremity PICC  tip terminates at the right atrium. Telemetry leads overlie the chest. Edematous changes seen in the chest wall soft tissues. No acute or worrisome osseous abnormality. IMPRESSION: Findings most compatible with pulmonary edema and developing bilateral effusions. Passive basilar atelectasis is likely present as well. Electronically Signed   By: Kreg Shropshire M.D.   On: 12/17/2020 01:59   ECHOCARDIOGRAM COMPLETE  Result Date: 12/17/2020    ECHOCARDIOGRAM REPORT   Patient Name:   WILLARD MADRIGAL Date of Exam: 12/17/2020 Medical Rec #:  [id number]       Height:       61.0 in Accession #:    [id number]      Weight:       125.7 lb Date of Birth:  1990/09/30      BSA:          1.550 m Patient Age:    29 years        BP:           122/66 mmHg Patient Gender: F               HR:           103 bpm. Exam Location:  Inpatient Procedure: 2D Echo, 3D Echo, Color Doppler, Cardiac Doppler and Strain Analysis Indications:    I50.21 Acute systolic (congestive) heart failure  History:        Patient has prior history of Echocardiogram examinations, most                 recent 11/25/2020. Risk Factors:Hypertension and Diabetes. Prior                 performed at [facility].  Sonographer:    Irving Burton Senior RDCS Referring Phys: [id number] ANASTASSIA DOUTOVA  Sonographer Comments: [redacted] weeks pregnant at time of study. IMPRESSIONS  1. Left ventricular ejection fraction, by estimation, is 40 to 45%. The left ventricle has mildly decreased function. The left ventricle demonstrates global hypokinesis. Left ventricular diastolic parameters are indeterminate.  2. Right  ventricular systolic function is normal. The right ventricular size is mildly enlarged. There is normal pulmonary artery systolic pressure. The estimated right ventricular systolic pressure is 29.2 mmHg.  3. Right atrial size was mildly dilated.  4. A small pericardial effusion is present.  5. The mitral valve is normal in structure. Mild to moderate mitral valve regurgitation.  6. The aortic valve is tricuspid. Aortic valve regurgitation is not visualized. No aortic stenosis is present.  7. The inferior vena cava is normal in size with greater than 50% respiratory variability, suggesting right atrial pressure of 3 mmHg. FINDINGS  Left Ventricle: Left ventricular ejection fraction, by estimation, is 40 to 45%. The left ventricle has mildly decreased function. The left ventricle demonstrates global hypokinesis. The left ventricular internal cavity size was normal in size. There is  no left ventricular hypertrophy. Left ventricular diastolic parameters are indeterminate. Right Ventricle: The right ventricular size is mildly enlarged. No increase in right ventricular wall thickness. Right ventricular systolic function is normal. There is normal pulmonary artery systolic pressure. The tricuspid regurgitant velocity is 2.56  m/s, and with an assumed right atrial pressure of 3 mmHg, the estimated right ventricular systolic pressure is 29.2 mmHg. Left Atrium: Left atrial size was normal in size. Right Atrium: Right atrial size was mildly dilated. Pericardium: A small pericardial effusion is present. Mitral Valve: The mitral valve is normal in structure. Mild to moderate mitral valve regurgitation. Tricuspid Valve: The tricuspid valve is normal  in structure. Tricuspid valve regurgitation is mild. Aortic Valve: The aortic valve is tricuspid. Aortic valve regurgitation is not visualized. No aortic stenosis is present. Pulmonic Valve: The pulmonic valve was grossly normal. Pulmonic valve regurgitation is trivial. Aorta: The  aortic root and ascending aorta are structurally normal, with no evidence of dilitation. Venous: The inferior vena cava is normal in size with greater than 50% respiratory variability, suggesting right atrial pressure of 3 mmHg. IAS/Shunts: The interatrial septum was not well visualized.  LEFT VENTRICLE PLAX 2D LVIDd:         4.10 cm  Diastology LVIDs:         3.50 cm  LV e' medial:    8.70 cm/s LV PW:         1.00 cm  LV E/e' medial:  16.7 LV IVS:        0.90 cm  LV e' lateral:   11.30 cm/s LVOT diam:     1.60 cm  LV E/e' lateral: 12.8 LV SV:         50 LV SV Index:   33 LVOT Area:     2.01 cm  RIGHT VENTRICLE RV S prime:     10.60 cm/s TAPSE (M-mode): 1.6 cm LEFT ATRIUM             Index       RIGHT ATRIUM           Index LA diam:        4.00 cm 2.58 cm/m  RA Area:     17.40 cm LA Vol (A2C):   48.2 ml 31.09 ml/m RA Volume:   49.00 ml  31.61 ml/m LA Vol (A4C):   35.2 ml 22.71 ml/m LA Biplane Vol: 44.4 ml 28.64 ml/m  AORTIC VALVE LVOT Vmax:   123.00 cm/s LVOT Vmean:  93.400 cm/s LVOT VTI:    0.251 m  AORTA Ao Root diam: 2.50 cm Ao Asc diam:  2.70 cm MITRAL VALVE                TRICUSPID VALVE MV Area (PHT): 4.80 cm     TR Peak grad:   26.2 mmHg MV Decel Time: 158 msec     TR Vmax:        256.00 cm/s MV E velocity: 145.00 cm/s MV A velocity: 45.80 cm/s   SHUNTS MV E/A ratio:  3.17         Systemic VTI:  0.25 m                             Systemic Diam: 1.60 cm Epifanio Lescheshristopher Schumann MD Electronically signed by Epifanio Lescheshristopher Schumann MD Signature Date/Time: 12/17/2020/10:41:24 AM    Final    US EKG SITE RITE  Result Date: 12/17/2020 If Site Rite image not attached, placement could not be confirmed due to current cardiac rhythm.    Scheduled Meds:  Chlorhexidine Gluconate Cloth  6 each Topical Daily   enoxaparin  80 mg Subcutaneous Q12H   ferrous sulfate  324 mg Oral Q breakfast   furosemide  40 mg Intravenous BID   mirtazapine  15 mg Oral QHS   ondansetron  4 mg Intravenous Q8H   prenatal multivitamin   1 tablet Oral Q1200   sodium chloride flush  3 mL Intravenous Q12H   Continuous Infusions:  sodium chloride     albumin human Stopped (12/17/20 0201)   famotidine 20 mg (12/17/20 1041)  insulin Stopped (12/17/20 1038)   promethazine (PHENERGAN)25mg  in 57mL NS     TPN ADULT (ION)       LOS: 1 day     Huey Bienenstock, MD [facility] If 7PM-7AM, please contact night-coverage 12/17/2020, 12:58 PM

## 2020-12-17 NOTE — Progress Notes (Signed)
ANTICOAGULATION CONSULT NOTE - Initial Consult  Pharmacy Consult for heparin Indication: DVT  No Known Allergies  Patient Measurements:   Heparin Dosing Weight: 57 kg  Vital Signs: Temp: 98.6 F (37 C) (07/27 0014) Temp Source: Oral (07/27 0014) BP: 146/71 (07/27 0014) Pulse Rate: 110 (07/27 0014)  Labs: Recent Labs    12/14/20 1206 12/15/20 0536 12/16/20 0430  HGB  --  8.6*  --   HCT  --  25.4*  --   PLT  --  168  --   CREATININE 1.01* 1.06* 1.09*    Estimated Creatinine Clearance: 57.5 mL/min (A) (by C-G formula based on SCr of 1.09 mg/dL (H)).   Medical History: Past Medical History:  Diagnosis Date   Anemia    Diabetes mellitus without complication (HCC)    Gastroparesis    Hypertension    Type 1 diabetes (HCC)     Medications:  See medication history  Assessment: 30 yo lady on lovenox for h/o DVT to switch to heparin therapy.  Last dose lovenox 7/26 @ 23:27.  Hg 8.6, PTLC 168 Pt is pregnant Goal of Therapy:  Heparin level 0.3-0.7 units/ml Monitor platelets by anticoagulation protocol: Yes   Plan:  Start heparin drip 12 hours after last lovenox dose @ 1000 units/hr Check heparin level 6-8 hours after start Daily HL and CBC Monitor for bleeding complications  Kristie Bracewell Poteet 12/17/2020,12:18 AM

## 2020-12-17 NOTE — Progress Notes (Signed)
ADA Standards of Care 2022 Diabetes in Pregnancy Target Glucose Ranges:  Fasting: 60 - 90 mg/dL Preprandial: 60 - 622 mg/dL 1 hr postprandial: Less than 140mg /dL (from first bite of meal) 2 hr postprandial: Less than 120 mg/dL (from first bit of meal)   Note patient transferred from [facility] to [facility] last night.  She will be transferring to High risk OB.  Currently on TPN and insulin drip (due to Type 1 DM) and patient to start tube feeds to see if she can tolerate.   Recommendations:  Keep insulin drip for now b/c it is safest with both TPN and enteral feeds.  EndoTool will adjust as feeds added. Please do not transition off insulin drip without discussing with Diabetes Coordinator and pharmacy to make sure that insulin needs are covered adequately.   Thanks,  [facility], RN, BC-ADM Inpatient Diabetes Coordinator Pager [phone]  (8a-5p)

## 2020-12-17 NOTE — Progress Notes (Signed)
Initial Nutrition Assessment  DOCUMENTATION CODES:   Not applicable  INTERVENTION:   -TPN management per pharmacy (100 mg thiamine daily has been added to TPN) -Once cortrak placement has been verified:  Initiate Osmolite 1.5 @ 20 ml/hr via cortrak tube and increase by 10 ml every 12 hours to goal rate of 50 ml/hr.   45 ml Prosource TF BID.    Tube feeding regimen provides 1880 kcal (100% of needs), 97 grams of protein, and 914 ml of H2O.    NUTRITION DIAGNOSIS:   Inadequate oral intake related to nausea, vomiting as evidenced by NPO status.  GOAL:   Patient will meet greater than or equal to 90% of their needs  MONITOR:   Diet advancement, Labs, Weight trends, TF tolerance, Skin, I & O's  REASON FOR ASSESSMENT:   Consult Assessment of nutrition requirement/status, New TPN/TNA  ASSESSMENT:   30 y.o. female with medical history significant of [redacted]w[redacted]d on presentation, Estimated Date of Delivery: 03/30/21 by by 9 week ultrasound, Type 1 diabetic, Cardiomyopathy, chronic systolic  Pt admitted with starvation ketoacidosis due to hyperemesis gravidum and DKA.   7/27- cortrak placed- awaiting confirmation of post-pyloric placement  Reviewed I/O's: -2.1 L x 24 hours  UOP: 2.3 L x 24 hours   GA: [redacted]w[redacted]d EDD: 03/30/21 G2P1 Prepregnancy Weight: ~113 lb (51.4kg at 07/01/2020 MD visit) Prepregnancy BMI: normal Expected weight gain at full term: 25-35 lbs Current weight gain: ~12 lbs. Slightly less than recommended for weeks gestation (~13-19 lbs recommended).   Pt very drowsy at time of visit. Pt opened eyes when RD called her name and nodded head when asked if cortrak placement went ok. She did not provide additional history. Educated pt regarding nutritional care plan.   Case discussed with cortrak RD, who reports pt has a 30 year old (had hyperemesis during previous pregnancy "but not as bad"). She has been having persistent emesis with and without NGT placement. Of note, pt  refused NGT placement during previous admission at [facility]. TPN was initiated at [facility] for nutrition and pt was subsequently transferred to [facility] for cortrak tube placement (pt refused NGT placement and PICC had already been placed to start TPN).   Pt remains on TPN at 65 ml/hr, which porvides 1966 kcals and 95 grams protein, meeting 100% of estimated kcal and protein needs.   Per MD, plan to start TF low and slow to see if she tolerates.   Medications reviewed and include ferrous sulfate, lasix, remron, zofran, and IV albumin.   Lab Results  Component Value Date   HGBA1C 5.8 (H) 11/21/2020   PTA DM medications are Insulin pump- basal rates at last d/c from [facility] was 0.05 units/hr which equals 1.2 units in 24 hours period, 1 unit drops blood sugar 100 mg/dL, 1 unit per 30 grams CHO.   Labs reviewed: CBGS: 75-158 (inpatient orders for glycemic control are IV insulin drip).     NUTRITION - FOCUSED PHYSICAL EXAM:  Flowsheet Row Most Recent Value  Orbital Region No depletion  Upper Arm Region No depletion  Thoracic and Lumbar Region No depletion  Buccal Region No depletion  Temple Region No depletion  Clavicle Bone Region No depletion  Clavicle and Acromion Bone Region No depletion  Scapular Bone Region No depletion  Dorsal Hand No depletion  Patellar Region No depletion  Anterior Thigh Region No depletion  Posterior Calf Region No depletion  Edema (RD Assessment) Mild  Hair Reviewed  Eyes Reviewed  Mouth Reviewed  Skin  Reviewed  Nails Reviewed       Diet Order:   Diet Order             Diet NPO time specified  Diet effective now                   EDUCATION NEEDS:   No education needs have been identified at this time  Skin:  Skin Assessment: Reviewed RN Assessment  Last BM:  12/16/20  Height:   Ht Readings from Last 1 Encounters:  12/12/20 5\' 1"  (1.549 m)    Weight:   Wt Readings from Last 1 Encounters:  12/12/20 57 kg    Ideal Body Weight:  47.7  kg  BMI:  There is no height or weight on file to calculate BMI.  Estimated Nutritional Needs:   Kcal:  1700-1900  Protein:  90-105 grams  Fluid:  > 1.7 L    12/14/20, RD, LDN, CDCES Registered Dietitian II Certified Diabetes Care and Education Specialist Please refer to [facility] for RD and/or RD on-call/weekend/after hours pager

## 2020-12-17 NOTE — Procedures (Signed)
Cortrak  Person Inserting Tube:  Dannis Deroche, Verdon Cummins, RD Tube Type:  Cortrak - 43 inches Tube Size:  10 Tube Location:  Left nare Initial Placement:  Postpyloric Secured by: Bridle Technique Used to Measure Tube Placement:  Marking at nare/corner of mouth Cortrak Secured At:  97 cm  Cortrak Tube Team Note:  Consult received to place a Cortrak feeding tube.   Cortrak RD discussed pt with RN and with MD. Cathlean Sauer is required, abdominal x-ray has been ordered by the MD. Please confirm tube placement before using the Cortrak tube.   If the tube becomes dislodged please keep the tube and contact the Cortrak team at [email or web address] (password [email or web address]) for replacement.  If after hours and replacement cannot be delayed, place a NG tube and confirm placement with an abdominal x-ray.    Eugene Gavia, MS, RD, LDN (she/her/hers) RD pager number and weekend/on-call pager number located in Amion.

## 2020-12-17 NOTE — Consult Note (Addendum)
Cardiology Consultation:   Patient ID: Ashley Camacho MRN: [id number]; DOB: [birth year]  Admit date: 12/16/2020 Date of Consult: 12/17/2020  PCP:  Hildred Laserherry, Anika, MD   [facility] Providers Cardiologist:  [facility]; Dr. Royann Shiversroitoru     Patient Profile:   Ashley Camacho is a 30 y.o. female with a PMH of recently diagnosed chronic combined CHF, DM type 1, anemia, and currently pregnant at 25w gestation, who is being seen 12/17/2020 for the evaluation of CHF at the request of Dr. Adela Glimpseoutova.  History of Present Illness:   Ashley Camacho has had several admissions and ER visits in the past 3 months, predominately for hyperemesis in the setting of pregnancy. She [facility]20 with inability to tolerate PO 2/2 hyperemesis gravida. She had acute on chronic anemia with Hgb down to 6.1 that admission requiring PRBC transfusion. Seen by heme/onc and recommended for IV iron. She subsequently developed fever and leukocytosis c/f sepsis 2/2 multifocal PNA. [facility]. She was found to have rhinovirus and completed a 10 day course of Ctx and azithromycin. She underwent a thoracentesis with 1.3L removal of transudative fluid on 11/25/20. Echocardiogram 11/26/20 showed EF 45% - [facility] and diuresed with IV lasix with plans to undergo repeat echo at [redacted] weeks gestation. Her hospital course was further complicated by right brachial vein DVT and she was discharged home on lovenox. She was discharged home 12/09/20 only to [facility] with poor po intake, nausea, and vomiting. Previously has not tolerated NG tub and was started on TPN with plans for [facility]. She was diuresed with IV lasix with IV albumin support due to hypoalbuminemia for management of 3rd spacing and volume overload 2/2 her cardiomyopathy. Given concerns for high-risk pregnancy, patient was [facility]  evaluation. Cortrak placed this morning. CXR here shows pulmonary edema with developing bilateral effusions. Echo this admission showed EF 40-45%, global hypokinesis, mild RV enlargement, mild RVE/RAE, small pericardial effusion, and mild-moderate MR. Cardiology asked to evaluate for recently diagnosed CHF.   At the time of this evaluation she continues to have frequent nausea and vomiting. Prior to her pregnancy she did not have any cardiac complaints c/f CHF or ACS. She notes having hyperemesis gravida with her first pregnancy 10 years ago which resolved in later trimesters and she went on to have an uneventful pregnancy. She has no prior heard disease or heart failure history. She has had some SOB since onset of her current pregnancy. She notes some chest discomfort which she attributes to indigestion with frequent vomiting. She has had some swelling in her legs more recently but no complaints of orthopnea or PND. She has no symptoms c/f OSA. No exertional chest pain. No significant family history of CHF. Father died from MI in his 3540s, otherwise no history of CAD. She denies tobacco abuse, ETOH use, or recreational drug use.      Past Medical History:  Diagnosis Date   Anemia    Diabetes mellitus without complication (HCC)    Gastroparesis    Hypertension    Type 1 diabetes (HCC)     Past Surgical History:  Procedure Laterality Date   CESAREAN SECTION     MOUTH SURGERY       Home Medications:  Prior to Admission medications   Medication Sig Start Date End Date Taking? Authorizing Provider  aluminum-petrolatum-zinc (1-2-3 PASTE) 0.027-13.7-12.5% PSTE  paste Apply 1 application topically 3 (three) times daily. 12/16/20  Yes Darlin Priestly, MD  enoxaparin (LOVENOX) 80 MG/0.8ML injection Inject 0.8 mLs (80 mg total) into the skin every 12 (twelve) hours. 12/16/20  Yes Darlin Priestly, MD  famotidine (PEPCID) 20-0.9 MG/50ML-% Inject 50 mLs (20 mg total) into the vein every 12 (twelve) hours. 12/16/20  Yes  Darlin Priestly, MD  insulin aspart (NOVOLOG FLEXPEN) 100 UNIT/ML FlexPen Inject 200-350 Units into the skin See admin instructions. 200-250: add 2u. 251-300: add 4u. 301-350: add 8u. > 400: Call MD. 10/30/19  Yes [provider]  mirtazapine (REMERON SOL-TAB) 15 MG disintegrating tablet Take 1 tablet (15 mg total) by mouth at bedtime. 12/16/20  Yes Darlin Priestly, MD  ondansetron [facility]) 4 MG/2ML SOLN injection Inject 2 mLs (4 mg total) into the vein every 8 (eight) hours. 12/16/20  Yes Darlin Priestly, MD  Prenatal Vit-Fe Fumarate-FA (PRENATAL MULTIVITAMIN) TABS tablet Take 1 tablet by mouth daily at 12 noon.   Yes [provider]  prochlorperazine (COMPAZINE) 10 MG/2ML injection Inject 2 mLs (10 mg total) into the vein every 6 (six) hours as needed. Patient taking differently: Inject 10 mg into the vein every 6 (six) hours as needed (nausea / vomiting.). 12/16/20  Yes Darlin Priestly, MD  sodium chloride 0.9 % SOLN 50 mL with promethazine 25 MG/ML SOLN 25 mg Inject 25 mg into the vein every 6 (six) hours as needed. Patient taking differently: Inject 25 mg into the vein every 6 (six) hours as needed (nausea / vomiting). 12/16/20  Yes Darlin Priestly, MD  ferrous sulfate 324 MG TBEC Take 324 mg by mouth. Patient not taking: No sig reported    [provider]    Inpatient Medications: Scheduled Meds:  Chlorhexidine Gluconate Cloth  6 each Topical Daily   enoxaparin  60 mg Subcutaneous Q12H   ferrous sulfate  324 mg Oral Q breakfast   furosemide  40 mg Intravenous BID   mirtazapine  15 mg Oral QHS   ondansetron  4 mg Intravenous Q8H   prenatal multivitamin  1 tablet Oral Q1200   sodium chloride flush  3 mL Intravenous Q12H   Continuous Infusions:  sodium chloride     albumin human Stopped (12/17/20 0201)   famotidine 20 mg (12/17/20 1041)   insulin 2.2 Units/hr (12/17/20 1316)   promethazine (PHENERGAN)25mg  in 50mL NS     TPN ADULT (ION)     PRN Meds: sodium chloride, acetaminophen **OR**  acetaminophen, dextrose, guaiFENesin-dextromethorphan, prochlorperazine, promethazine (PHENERGAN)25mg  in 50mL NS, sodium chloride flush  Allergies:   No Known Allergies  Social History:   Social History   Socioeconomic History   Marital status: Single    Spouse name: Not on file   Number of children: Not on file   Years of education: Not on file   Highest education level: Not on file  Occupational History   Occupation: home maker  Tobacco Use   Smoking status: Never   Smokeless tobacco: Never  Vaping Use   Vaping Use: Never used  Substance and Sexual Activity   Alcohol use: No   Drug use: No   Sexual activity: Yes    Birth control/protection: None  Other Topics Concern   Not on file  Social History Narrative   Not on file   Social Determinants of Health   Financial Resource Strain: Not on file  Food Insecurity: Not on file  Transportation Needs: Not on file  Physical Activity: Not on file  Stress:  Not on file  Social Connections: Not on file  Intimate Partner Violence: Not on file    Family History:    Family History  Problem Relation Age of Onset   Diabetes type I Father    CAD Father    CAD Paternal Grandmother    CAD Paternal Grandfather    Breast cancer Mother    Ovarian cancer Neg Hx      ROS:  Please see the history of present illness.   All other ROS reviewed and negative.     Physical Exam/Data:   Vitals:   12/17/20 0400 12/17/20 0600 12/17/20 0829 12/17/20 1304  BP: (!) 134/59 122/66 128/76 (!) 147/69  Pulse: (!) 103 (!) 102 (!) 104 (!) 104  Resp: (!) 32 (!) 24 18 18   Temp: 98.7 F (37.1 C) 98 F (36.7 C) 98.6 F (37 C) 98.7 F (37.1 C)  TempSrc: Oral Oral Oral Oral  SpO2: 97% 99% 97% 98%    Intake/Output Summary (Last 24 hours) at 12/17/2020 1331 Last data filed at 12/17/2020 0530 Gross per 24 hour  Intake 160.08 ml  Output 2250 ml  Net -2089.92 ml   Last 3 Weights 12/12/2020 11/20/2020 11/11/2020  Weight (lbs) 125 lb 10.6 oz 125  lb 10.6 oz 129 lb  Weight (kg) 57 kg 57 kg 58.514 kg     There is no height or weight on file to calculate BMI.  General:  Malnourished appearing female laying in bed in no acute distress HEENT: sclera anicteric, dry MM, acid staining to teeth Lymph: no adenopathy Neck: no JVD Endocrine:  No thryomegaly Vascular: No carotid bruits; distal pulses 2+ bilaterally  Cardiac:  normal S1, S2; tachycardic, regular rhythm; +gallop, no murmur, no rubs  Lungs:  decreased breath sounds at lung bases, no overt wheezing, rhonchi, or rales Abd: soft, nontender, no hepatomegaly  Ext: 2+ LE edema Musculoskeletal:  No deformities, BUE and BLE strength normal and equal Skin: warm and dry  Neuro:  CNs 2-12 intact, no focal abnormalities noted Psych:  flat affect   EKG:  The EKG was personally reviewed and demonstrates:  sinus tachycardia, rate 108 bpm, low voltage, no STE/D. Telemetry:  Telemetry was personally reviewed and demonstrates:  sinus rhythm/sinus tachycardia  Relevant CV Studies:  Echocardiogram 11/25/20: INTERPRETATION ---------------------------------------------------------------    MILD LV DYSFUNCTION (See above) - EF 45%   NORMAL LA PRESSURES WITH NORMAL DIASTOLIC FUNCTION    NORMAL RIGHT VENTRICULAR SYSTOLIC FUNCTION    VALVULAR REGURGITATION: TRIVIAL AR, MILD MR, MILD PR, TRIVIAL TR    NO VALVULAR STENOSIS    SMALL PERICARDIAL EFFUSION (See above)    MILD TO MODERATE MR    Echocardiogram 12/16/20: 1. Left ventricular ejection fraction, by estimation, is 40 to 45%. The  left ventricle has mildly decreased function. The left ventricle  demonstrates global hypokinesis. Left ventricular diastolic parameters are  indeterminate.   2. Right ventricular systolic function is normal. The right ventricular  size is mildly enlarged. There is normal pulmonary artery systolic  pressure. The estimated right ventricular systolic pressure is 29.2 mmHg.   3. Right atrial size was mildly dilated.    4. A small pericardial effusion is present.   5. The mitral valve is normal in structure. Mild to moderate mitral valve  regurgitation.   6. The aortic valve is tricuspid. Aortic valve regurgitation is not  visualized. No aortic stenosis is present.   7. The inferior vena cava is normal in size with greater than 50%  respiratory variability, suggesting right atrial pressure of 3 mmHg.    Laboratory Data:  High Sensitivity Troponin:   Recent Labs  Lab 11/21/20 1937 11/21/20 2136  TROPONINIHS 9 8     Chemistry Recent Labs  Lab 12/15/20 0536 12/15/20 2328 12/16/20 0430 12/17/20 0049  NA 139  --  139 138  K 3.4* 3.5 3.5 3.6  CL 113*  --  109 105  CO2 22  --  25 25  GLUCOSE 176*  --  143* 207*  BUN <5*  --  <5* <5*  CREATININE 1.06*  --  1.09* 1.03*  CALCIUM 7.4*  --  7.6* 7.9*  GFRNONAA >60  --  >60 >60  ANIONGAP 4*  --  5 8    Recent Labs  Lab 12/13/20 0811 12/15/20 0536 12/17/20 0049  PROT 5.2* 4.2* 4.9*  ALBUMIN 1.8* 1.5* 2.6*  AST 32 25 16  ALT 15 12 10   ALKPHOS 102 78 72  BILITOT 3.3* 1.4* 1.0   Hematology Recent Labs  Lab 12/13/20 0811 12/15/20 0536 12/17/20 0049  WBC 8.8 7.5 6.1  RBC 3.09* 2.82* 2.48*  2.48*  HGB 9.5* 8.6* 7.5*  HCT 27.8* 25.4* 22.3*  MCV 90.0 90.1 89.9  MCH 30.7 30.5 30.2  MCHC 34.2 33.9 33.6  RDW 13.2 14.0 13.5  PLT 177 168 177   BNP Recent Labs  Lab 12/17/20 0049  BNP 2,128.9*    DDimer No results for input(s): DDIMER in the last 168 hours.   Radiology/Studies:  DG CHEST PORT 1 VIEW  Result Date: 12/17/2020 CLINICAL DATA:  Radiograph 12/13/2020 EXAM: PORTABLE CHEST 1 VIEW COMPARISON:  CT 11/22/2020 FINDINGS: Perihilar and basilar predominant hazy and patchy opacities with pulmonary vascular congestion and cuffing most consistent with pulmonary edema. Layering bilateral pleural effusions including fluid tracking in the right fissures. No pneumothorax. Cardiac size is similar to comparison prior. Left upper extremity  PICC tip terminates at the right atrium. Telemetry leads overlie the chest. Edematous changes seen in the chest wall soft tissues. No acute or worrisome osseous abnormality. IMPRESSION: Findings most compatible with pulmonary edema and developing bilateral effusions. Passive basilar atelectasis is likely present as well. Electronically Signed   By: 01/23/2021 M.D.   On: 12/17/2020 01:59   ECHOCARDIOGRAM COMPLETE  Result Date: 12/17/2020    ECHOCARDIOGRAM REPORT   Patient Name:   Ashley Camacho Date of Exam: 12/17/2020 Medical Rec #:  12/19/2020       Height:       61.0 in Accession #:    [id number]      Weight:       125.7 lb Date of Birth:  [birth year]      BSA:          1.550 m Patient Age:    29 years        BP:           122/66 mmHg Patient Gender: F               HR:           103 bpm. Exam Location:  Inpatient Procedure: 2D Echo, 3D Echo, Color Doppler, Cardiac Doppler and Strain Analysis Indications:    I50.21 Acute systolic (congestive) heart failure  History:        Patient has prior history of Echocardiogram examinations, most                 recent 11/25/2020. Risk Factors:Hypertension and Diabetes. Prior  performed at [facility]  Sonographer:    Irving Burton Senior RDCS Referring Phys: [id number] ANASTASSIA DOUTOVA  Sonographer Comments: [redacted] weeks pregnant at time of study. IMPRESSIONS  1. Left ventricular ejection fraction, by estimation, is 40 to 45%. The left ventricle has mildly decreased function. The left ventricle demonstrates global hypokinesis. Left ventricular diastolic parameters are indeterminate.  2. Right ventricular systolic function is normal. The right ventricular size is mildly enlarged. There is normal pulmonary artery systolic pressure. The estimated right ventricular systolic pressure is 29.2 mmHg.  3. Right atrial size was mildly dilated.  4. A small pericardial effusion is present.  5. The mitral valve is normal in structure. Mild to moderate mitral valve regurgitation.  6. The  aortic valve is tricuspid. Aortic valve regurgitation is not visualized. No aortic stenosis is present.  7. The inferior vena cava is normal in size with greater than 50% respiratory variability, suggesting right atrial pressure of 3 mmHg. FINDINGS  Left Ventricle: Left ventricular ejection fraction, by estimation, is 40 to 45%. The left ventricle has mildly decreased function. The left ventricle demonstrates global hypokinesis. The left ventricular internal cavity size was normal in size. There is  no left ventricular hypertrophy. Left ventricular diastolic parameters are indeterminate. Right Ventricle: The right ventricular size is mildly enlarged. No increase in right ventricular wall thickness. Right ventricular systolic function is normal. There is normal pulmonary artery systolic pressure. The tricuspid regurgitant velocity is 2.56  m/s, and with an assumed right atrial pressure of 3 mmHg, the estimated right ventricular systolic pressure is 29.2 mmHg. Left Atrium: Left atrial size was normal in size. Right Atrium: Right atrial size was mildly dilated. Pericardium: A small pericardial effusion is present. Mitral Valve: The mitral valve is normal in structure. Mild to moderate mitral valve regurgitation. Tricuspid Valve: The tricuspid valve is normal in structure. Tricuspid valve regurgitation is mild. Aortic Valve: The aortic valve is tricuspid. Aortic valve regurgitation is not visualized. No aortic stenosis is present. Pulmonic Valve: The pulmonic valve was grossly normal. Pulmonic valve regurgitation is trivial. Aorta: The aortic root and ascending aorta are structurally normal, with no evidence of dilitation. Venous: The inferior vena cava is normal in size with greater than 50% respiratory variability, suggesting right atrial pressure of 3 mmHg. IAS/Shunts: The interatrial septum was not well visualized.  LEFT VENTRICLE PLAX 2D LVIDd:         4.10 cm  Diastology LVIDs:         3.50 cm  LV e' medial:     8.70 cm/s LV PW:         1.00 cm  LV E/e' medial:  16.7 LV IVS:        0.90 cm  LV e' lateral:   11.30 cm/s LVOT diam:     1.60 cm  LV E/e' lateral: 12.8 LV SV:         50 LV SV Index:   33 LVOT Area:     2.01 cm  RIGHT VENTRICLE RV S prime:     10.60 cm/s TAPSE (M-mode): 1.6 cm LEFT ATRIUM             Index       RIGHT ATRIUM           Index LA diam:        4.00 cm 2.58 cm/m  RA Area:     17.40 cm LA Vol (A2C):   48.2 ml 31.09 ml/m RA Volume:   49.00 ml  31.61  ml/m LA Vol (A4C):   35.2 ml 22.71 ml/m LA Biplane Vol: 44.4 ml 28.64 ml/m  AORTIC VALVE LVOT Vmax:   123.00 cm/s LVOT Vmean:  93.400 cm/s LVOT VTI:    0.251 m  AORTA Ao Root diam: 2.50 cm Ao Asc diam:  2.70 cm MITRAL VALVE                TRICUSPID VALVE MV Area (PHT): 4.80 cm     TR Peak grad:   26.2 mmHg MV Decel Time: 158 msec     TR Vmax:        256.00 cm/s MV E velocity: 145.00 cm/s MV A velocity: 45.80 cm/s   SHUNTS MV E/A ratio:  3.17         Systemic VTI:  0.25 m                             Systemic Diam: 1.60 cm Epifanio Lesches MD Electronically signed by Epifanio Lesches MD Signature Date/Time: 12/17/2020/10:41:24 AM    Final    [place] EKG SITE RITE  Result Date: 12/17/2020 If Site Rite image not attached, placement could not be confirmed due to current cardiac rhythm.  [place] EKG SITE RITE  Result Date: 12/13/2020 If [facility] image not attached, placement could not be confirmed due to current cardiac rhythm.    Assessment and Plan:   1. 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 She has been struggling with hyperemesis gravida resulting in malnutrition. BNP elevated to 2100, up from 1100 11/24/20. CXR with pulmonary edema and emerging pleural effusions today. Echo this admission with EF 40-45% (45% 11/[facility]  Hypoalbuminemia likely contributing to volume overload 2/2 3rd spacing. Unlikely her viral infection earlier this month contributed to her cardiomyopathy as EF was already 45% at  that time. TSH is wnl. Unlikely low grade tachycardia has contributed. No anginal complaints to suggest obstructive CAD. No symptoms c/f OSA. She has been diuresing with IV lasix 40mg  TID with UOP net -2.0L since arrival to [facility].  - Would continue IV lasix for now - Could consider adding hydralazine if BP remains elevated - Continue to monitor strict I&Os and daily weights - Continue to monitor electrolytes and replete as needed to maintain K >4, Mg >2  2. DM type 1: diagnosed at age 18. C/b gastroparesis. Follows with endocrine outpatient with documented brittle diabetes. A1C 5.8 11/21/20.  - Continue management per primary team.   3. Hyperemesis gravida at 25w gestation: severe malnutrition in the setting of refractory N/V. Now s/p Cortrak placement for tube feeds.  - Continue management per primary team and OB    Risk Assessment/Risk Scores:   New York Heart Association (NYHA) Functional Class NYHA Class III        For questions or updates, please contact [facility] Please consult [email or web address] for contact info under    Signed, 01/22/21, PA-C  12/17/2020 1:31 PM   I have seen and examined the patient along with 12/19/2020, PA-C .  I have reviewed the chart, notes and new data.  I agree with PA/NP's note.  Key new complaints: overwhelming problem is intractable nausea and vomiting, dyspnea improved, no orthopnea. Never had angina. Key examination changes: Pale, appears acutely and chronically ill, 2+ generalized edema. RRR, tachycardic, summation gallop is present, but no murmurs or rubs. Hard to see JVP. Key new findings / data: echo reviewed, LV systolic  function is diminished as measured both by EF and GLS and there is evidence of increased mean left atrial pressure. Severe hypoalbuminemia. Moderate to severe nutritional anemia (normocytic normochromic). Reviewed chest CT Angio (motion degraded and venous phase scan) - good enough to identify normal location of  right and left coronary ostia. Also no atherosclerotic calcifications in the thoracic aorta or coronaries. ECG is nonischemic, but does show generalized low voltage (not a new finding).  PLAN: The cause of her cardiomyopathy is unclear. - Too early for peripartum CMP. - 19 years of type 1 DM put her at risk for CAD, but no atherosclerosis seen on CT chest, no ischemic ECG changes and no angina. Would like to avoid coronary angio (either conventional or CT) due to pregnancy if possible. - I have read of case reports of beri-beri due to hyperemesis gravidarum, but that would appear to be exceedingly unusual. I do not think it will hurt to give her high dose thiamine (could add more thiamine to TPN).  Responding well to diuretics. BP allows hydralazine - nitrates, but she cannot tolerate PO meds. Will try low dose hydralazine via tube.  Thurmon Fair, MD, [facility] [phone] 12/17/2020, 5:10 PM

## 2020-12-17 NOTE — Consult Note (Signed)
OB/GYN Consult Note  Referring Provider: Elgergawy, Danella PentonDawood  Ashley Camacho is a 30 y.o. G2P0101 , c/s x 1, admitted for hyperemesis gravidarum, DKA in the setting of type 1 DM and gastroparesis. OB consulted for management of pregnancy related issues and recommendations on hyperemesis.  The pt was transferred from [facility] due to high risk OB status, intractable nausea and vomiting non responsive to antiemetics, Type 1 DM, and chronic systolic heart failure.  Pt was previously delivered by cesarean section, and she has not decided on route of delivery for this pregnancy The patient had a previous admission to [facility] from 7/4 to 7/20 due to sepsis from pneumonia, persistent nausea and vomiting, decreased LVEF, and malnutrition.  Pt had multiple consultations during that admission.  Pt had limited po intake and NG tube placement was attempted on 7/15 which was then removed immediately due to discomfort.  Pt was discharged on 7/20 still not fulfilling most nutritional goals.  Of note the pt is very sensitive to insulin administration.  The pt still notes nausea at this time but the NG tube was successfully placed this morning.      Past Medical History:  Diagnosis Date   Anemia    Diabetes mellitus without complication (HCC)    Gastroparesis    Hypertension    Type 1 diabetes (HCC)     Past Surgical History:  Procedure Laterality Date   CESAREAN SECTION     MOUTH SURGERY      OB History  Gravida Para Term Preterm AB Living  2 1   1   1   SAB IAB Ectopic Multiple Live Births          1    # Outcome Date GA Lbr Len/2nd Weight Sex Delivery Anes PTL Lv  2 Current           1 Preterm 12/13/10   1361 g F CS-Unspec  Y LIV    Social History   Socioeconomic History   Marital status: Single    Spouse name: Not on file   Number of children: Not on file   Years of education: Not on file   Highest education level: Not on file  Occupational History   Occupation: home maker  Tobacco Use    Smoking status: Never   Smokeless tobacco: Never  Vaping Use   Vaping Use: Never used  Substance and Sexual Activity   Alcohol use: No   Drug use: No   Sexual activity: Yes    Birth control/protection: None  Other Topics Concern   Not on file  Social History Narrative   Not on file   Social Determinants of Health   Financial Resource Strain: Not on file  Food Insecurity: Not on file  Transportation Needs: Not on file  Physical Activity: Not on file  Stress: Not on file  Social Connections: Not on file    Family History  Problem Relation Age of Onset   Diabetes type I Father    CAD Father    CAD Paternal Grandmother    CAD Paternal Grandfather    Breast cancer Mother    Ovarian cancer Neg Hx     Medications Prior to Admission  Medication Sig Dispense Refill Last Dose   aluminum-petrolatum-zinc (1-2-3 PASTE) 0.027-13.7-12.5% PSTE paste Apply 1 application topically 3 (three) times daily.   Past Week   enoxaparin (LOVENOX) 80 MG/0.8ML injection Inject 0.8 mLs (80 mg total) into the skin every 12 (twelve) hours. 0 mL  12/16/2020  famotidine (PEPCID) 20-0.9 MG/50ML-% Inject 50 mLs (20 mg total) into the vein every 12 (twelve) hours. 50 mL  12/16/2020   insulin aspart (NOVOLOG FLEXPEN) 100 UNIT/ML FlexPen Inject 200-350 Units into the skin See admin instructions. 200-250: add 2u. 251-300: add 4u. 301-350: add 8u. > 400: Call MD.   Past Week   mirtazapine (REMERON SOL-TAB) 15 MG disintegrating tablet Take 1 tablet (15 mg total) by mouth at bedtime.   12/16/2020   ondansetron (ZOFRAN) 4 MG/2ML SOLN injection Inject 2 mLs (4 mg total) into the vein every 8 (eight) hours.  0 12/17/2020   Prenatal Vit-Fe Fumarate-FA (PRENATAL MULTIVITAMIN) TABS tablet Take 1 tablet by mouth daily at 12 noon.   12/16/2020   prochlorperazine (COMPAZINE) 10 MG/2ML injection Inject 2 mLs (10 mg total) into the vein every 6 (six) hours as needed. (Patient taking differently: Inject 10 mg into the vein every 6  (six) hours as needed (nausea / vomiting.).)   unk   sodium chloride 0.9 % SOLN 50 mL with promethazine 25 MG/ML SOLN 25 mg Inject 25 mg into the vein every 6 (six) hours as needed. (Patient taking differently: Inject 25 mg into the vein every 6 (six) hours as needed (nausea / vomiting).)   unk   ferrous sulfate 324 MG TBEC Take 324 mg by mouth. (Patient not taking: No sig reported)   Not Taking    No Known Allergies  Review of Systems: Negative except for what is mentioned in HPI.     Physical Exam: BP (!) 147/69 (BP Location: Left Arm)   Pulse (!) 104   Temp 98.7 F (37.1 C) (Oral)   Resp 18   LMP  (LMP Unknown)   SpO2 98%  CONSTITUTIONAL: Well-developed, unwell appearing female in no acute distress.  HENT:  Normocephalic, atraumatic, External right and left ear normal. Oropharynx currently discolored.  NG tube noted in place. EYES: Conjunctivae and EOM are normal.  NECK: Normal range of motion, supple, no masses SKIN: Skin is warm and dry. No rash noted. Not diaphoretic. No erythema. No pallor. NEUROLGIC: Alert and oriented to person, place, and time. Normal reflexes, muscle tone coordination. No cranial nerve deficit noted. PSYCHIATRIC: Normal mood and affect. Normal behavior. Normal judgment and thought content.  Normal speech but extremely quiet CARDIOVASCULAR: Normal heart rate noted, regular rhythm RESPIRATORY: Effort and breath sounds normal, difficult due to body habitus and positioning ABDOMEN: Soft, nontender, nondistended, gravid. PELVIC: deferred MUSCULOSKELETAL: Normal range of motion. 2+ edema to the knees and no tenderness. 2+ distal pulses.    Pertinent Labs/Studies:   Results for orders placed or performed during the hospital encounter of 12/16/20 (from the past 72 hour(s))  Glucose, capillary     Status: Abnormal   Collection Time: 12/16/20 10:05 PM  Result Value Ref Range   Glucose-Capillary 120 (H) 70 - 99 mg/dL    Comment: Glucose reference range applies  only to samples taken after fasting for at least 8 hours.  Glucose, capillary     Status: Abnormal   Collection Time: 12/17/20 12:08 AM  Result Value Ref Range   Glucose-Capillary 174 (H) 70 - 99 mg/dL    Comment: Glucose reference range applies only to samples taken after fasting for at least 8 hours.  Urinalysis, Routine w reflex microscopic Urine, Catheterized     Status: Abnormal   Collection Time: 12/17/20 12:21 AM  Result Value Ref Range   Color, Urine YELLOW YELLOW   APPearance CLEAR CLEAR   Specific Gravity,  Urine 1.009 1.005 - 1.030   pH 5.0 5.0 - 8.0   Glucose, UA NEGATIVE NEGATIVE mg/dL   Hgb urine dipstick MODERATE (A) NEGATIVE   Bilirubin Urine NEGATIVE NEGATIVE   Ketones, ur NEGATIVE NEGATIVE mg/dL   Protein, ur 30 (A) NEGATIVE mg/dL   Nitrite NEGATIVE NEGATIVE   Leukocytes,Ua SMALL (A) NEGATIVE   RBC / HPF 6-10 0 - 5 RBC/hpf   WBC, UA 11-20 0 - 5 WBC/hpf   Bacteria, UA RARE (A) NONE SEEN   Squamous Epithelial / LPF 0-5 0 - 5   Mucus PRESENT    Hyaline Casts, UA PRESENT     Comment: Performed at [facility], [address] [place], [place] [address]  Sodium, urine, random     Status: None   Collection Time: 12/17/20 12:24 AM  Result Value Ref Range   Sodium, Ur 134 mmol/L    Comment: Performed at [facility], [address], [place], [place] [address]  Creatinine, urine, random     Status: None   Collection Time: 12/17/20 12:24 AM  Result Value Ref Range   Creatinine, Urine 54.33 mg/dL    Comment: Performed at [facility], [address], [place], [place] [address]  Magnesium     Status: Abnormal   Collection Time: 12/17/20 12:49 AM  Result Value Ref Range   Magnesium 1.4 (L) 1.7 - 2.4 mg/dL    Comment: Performed at [facility] Lab, [address], [place], [place] [address]  Phosphorus     Status: None   Collection Time: 12/17/20 12:49 AM  Result Value Ref Range   Phosphorus 3.7 2.5 - 4.6 mg/dL    Comment: Performed at [facility], [address] [place], [place] [address]  CBC WITH DIFFERENTIAL     Status: Abnormal   Collection Time: 12/17/20 12:49 AM  Result Value Ref Range   WBC 6.1 4.0 - 10.5 K/uL   RBC 2.48 (L) 3.87 - 5.11 MIL/uL   Hemoglobin 7.5 (L) 12.0 - 15.0 g/dL   HCT 13.0 (L) 86.5 - 78.4 %   MCV 89.9 80.0 - 100.0 fL   MCH 30.2 26.0 - 34.0 pg   MCHC 33.6 30.0 - 36.0 g/dL   RDW 69.6 29.5 - 28.4 %   Platelets 177 150 - 400 K/uL   nRBC 0.0 0.0 - 0.2 %   Neutrophils Relative % 70 %   Neutro Abs 4.3 1.7 - 7.7 K/uL   Lymphocytes Relative 17 %   Lymphs Abs 1.1 0.7 - 4.0 K/uL   Monocytes Relative 6 %   Monocytes Absolute 0.4 0.1 - 1.0 K/uL   Eosinophils Relative 6 %   Eosinophils Absolute 0.4 0.0 - 0.5 K/uL   Basophils Relative 1 %   Basophils Absolute 0.0 0.0 - 0.1 K/uL   Immature Granulocytes 0 %   Abs Immature Granulocytes 0.02 0.00 - 0.07 K/uL    Comment: Performed at [facility], [address], [place], [place] [address]  TSH     Status: None   Collection Time: 12/17/20 12:49 AM  Result Value Ref Range   TSH 3.907 0.350 - 4.500 uIU/mL    Comment: Performed by a 3rd Generation assay with a functional sensitivity of <=0.01 uIU/mL. Performed at [facility], [address], [place], [place] [address]   Comprehensive metabolic panel     Status: Abnormal   Collection Time: 12/17/20 12:49 AM  Result Value  Ref Range   Sodium 138 135 - 145 mmol/L   Potassium 3.6 3.5 - 5.1 mmol/L   Chloride 105 98 - 111 mmol/L   CO2 25 22 - 32 mmol/L   Glucose, Bld 207 (H) 70 - 99 mg/dL    Comment: Glucose reference range applies only to samples taken after fasting for at least 8 hours.   BUN <5 (L) 6 - 20 mg/dL   Creatinine, Ser 7.25 (H) 0.44 - 1.00 mg/dL   Calcium 7.9 (L) 8.9 - 10.3 mg/dL   Total Protein 4.9 (L) 6.5 - 8.1 g/dL   Albumin 2.6 (L) 3.5 - 5.0 g/dL   AST 16 15 - 41 U/L   ALT 10 0 - 44 U/L   Alkaline Phosphatase 72 38 - 126 U/L   Total Bilirubin 1.0 0.3 - 1.2 mg/dL   GFR,  Estimated >36 >64 mL/min    Comment: (NOTE) Calculated using the CKD-EPI Creatinine Equation (2021)    Anion gap 8 5 - 15    Comment: Performed at [facility], [address] [place], [place] [address]  Prealbumin     Status: Abnormal   Collection Time: 12/17/20 12:49 AM  Result Value Ref Range   Prealbumin 5.8 (L) 18 - 38 mg/dL    Comment: Performed at [facility], [address] [place], [place] [address]  Brain natriuretic peptide     Status: Abnormal   Collection Time: 12/17/20 12:49 AM  Result Value Ref Range   B Natriuretic Peptide 2,128.9 (H) 0.0 - 100.0 pg/mL    Comment: Performed at [facility], [address] [place], [place] [address]  CK     Status: None   Collection Time: 12/17/20 12:49 AM  Result Value Ref Range   Total CK 55 38 - 234 U/L    Comment: Performed at [facility], [address], [place], [place] [address]  CK     Status: None   Collection Time: 12/17/20 12:49 AM  Result Value Ref Range   Total CK 54 38 - 234 U/L    Comment: Performed at [facility], [address] [place], [place] [address]  Vitamin B12     Status: Abnormal   Collection Time: 12/17/20 12:49 AM  Result Value Ref Range   Vitamin B-12 1,146 (H) 180 - 914 pg/mL    Comment: (NOTE) This assay is not validated for testing neonatal or myeloproliferative syndrome specimens for Vitamin B12 levels. Performed at [facility], [address], [place], [place] [address]   Folate     Status: None   Collection Time: 12/17/20 12:49 AM  Result Value Ref Range   Folate 19.8 >5.9 ng/mL    Comment: Performed at [facility], [address], [place], [place] [address]  Iron and TIBC     Status: Abnormal   Collection Time: 12/17/20 12:49 AM  Result Value Ref Range   Iron 50 28 - 170 ug/dL   TIBC 010 (L) 932 - 355 ug/dL   Saturation Ratios 40 (H) 10.4 - 31.8 %   UIBC 75 ug/dL    Comment: Performed at [facility], [address],  [place], [place] [address]  Ferritin     Status: Abnormal   Collection Time: 12/17/20 12:49 AM  Result Value Ref Range   Ferritin 681 (H) 11 - 307 ng/mL    Comment: Performed at [facility], [address], [place], [place] [address]  Reticulocytes     Status: Abnormal   Collection Time: 12/17/20 12:49 AM  Result Value Ref Range   Retic Ct Pct 1.9 0.4 - 3.1 %   RBC. 2.48 (L) 3.87 - 5.11 MIL/uL   Retic Count, Absolute 46.6 19.0 - 186.0 K/uL   Immature Retic Fract 14.7 2.3 - 15.9 %    Comment: Performed at [facility], [address] [place], [place] [address]  Type and screen [facility]     Status: None   Collection Time: 12/17/20 12:49 AM  Result Value Ref Range   ABO/RH(D) A POS    Antibody Screen NEG    Sample Expiration      12/20/2020,2359 Performed at [facility], [address], [place], [place] [address]   Glucose, capillary     Status: Abnormal   Collection Time: 12/17/20  1:08 AM  Result Value Ref Range   Glucose-Capillary 211 (H) 70 - 99 mg/dL    Comment: Glucose reference range applies only to samples taken after fasting for at least 8 hours.  Glucose, capillary     Status: Abnormal   Collection Time: 12/17/20  2:21 AM  Result Value Ref Range   Glucose-Capillary 184 (H) 70 - 99 mg/dL    Comment: Glucose reference range applies only to samples taken after fasting for at least 8 hours.  Glucose, capillary     Status: Abnormal   Collection Time: 12/17/20  3:28 AM  Result Value Ref Range   Glucose-Capillary 165 (H) 70 - 99 mg/dL    Comment: Glucose reference range applies only to samples taken after fasting for at least 8 hours.  Glucose, capillary     Status: Abnormal   Collection Time: 12/17/20  4:24 AM  Result Value Ref Range   Glucose-Capillary 141 (H) 70 - 99 mg/dL    Comment: Glucose reference range applies only to samples taken after fasting for at least 8 hours.  Glucose, capillary     Status: Abnormal   Collection Time:  12/17/20  5:31 AM  Result Value Ref Range   Glucose-Capillary 145 (H) 70 - 99 mg/dL    Comment: Glucose reference range applies only to samples taken after fasting for at least 8 hours.  Glucose, capillary     Status: Abnormal   Collection Time: 12/17/20  6:33 AM  Result Value Ref Range   Glucose-Capillary 124 (H) 70 - 99 mg/dL    Comment: Glucose reference range applies only to samples taken after fasting for at least 8 hours.  Glucose, capillary     Status: Abnormal   Collection Time: 12/17/20  7:24 AM  Result Value Ref Range   Glucose-Capillary 123 (H) 70 - 99 mg/dL    Comment: Glucose reference range applies only to samples taken after fasting for at least 8 hours.  Glucose, capillary     Status: Abnormal   Collection Time: 12/17/20  8:37 AM  Result Value Ref Range   Glucose-Capillary 124 (H) 70 - 99 mg/dL    Comment: Glucose reference range applies only to samples taken after fasting for at least 8 hours.  Glucose, capillary     Status: None   Collection Time: 12/17/20 10:35 AM  Result Value Ref Range   Glucose-Capillary 75 70 - 99 mg/dL    Comment: Glucose reference range applies only to samples taken after fasting for at least 8 hours.  Glucose, capillary     Status:  None   Collection Time: 12/17/20 10:58 AM  Result Value Ref Range   Glucose-Capillary 79 70 - 99 mg/dL    Comment: Glucose reference range applies only to samples taken after fasting for at least 8 hours.  Glucose, capillary     Status: Abnormal   Collection Time: 12/17/20  1:06 PM  Result Value Ref Range   Glucose-Capillary 158 (H) 70 - 99 mg/dL    Comment: Glucose reference range applies only to samples taken after fasting for at least 8 hours.  Glucose, capillary     Status: Abnormal   Collection Time: 12/17/20  2:14 PM  Result Value Ref Range   Glucose-Capillary 191 (H) 70 - 99 mg/dL    Comment: Glucose reference range applies only to samples taken after fasting for at least 8 hours.  Glucose, capillary      Status: Abnormal   Collection Time: 12/17/20  3:13 PM  Result Value Ref Range   Glucose-Capillary 196 (H) 70 - 99 mg/dL    Comment: Glucose reference range applies only to samples taken after fasting for at least 8 hours.       Assessment and Plan :Ashley Camacho is a 30 y.o. G2P0101 admitted for intractable hyperemesis at 25.2 weeks, poor nutrition and NG tube placement. Type 1 diabetes mellitus in second trimester of pregnancy:         -management per medicine with insulin drip        -tube feeds starting soon 2. Hyperemesis gravidarum: Pt is currently on phenergan,zofran, compazine, pepcid                                             After reviewing the [facility] chart, I will add protonix as well. There are limited remaining medications that we usually use. Reglan is an option to be used if there are not any contraindications with her other medical problems.  It appears she has taken reglan in the past.  The only other consideration would be steroids to aid in treatment, but I'm wary to use them in this case because they may cause glucose spikes and make glucose control more difficult.  Agree with tube feedings until  pt is better able to take po nutrition.  3. [redacted] weeks pregnant: will add MFM consult for any other recommendations for pregnancy care.  For now, fetal heart tones q shift will suffice.  Would switch to daily fetal tracing at 29-30 weeks  4:Hypertension:  Monitor closely to make sure there is no superimposed preeclampsia  Thank you for this consult, we will follow along, please call with further questions.   For OB/GYN questions, please call the Center for [facility] at [facility] Monday - Friday, 8 am - 5 pm: [phone] All other times: [phone]    Mariel Aloe, M.D. Attending Obstetrician & Gynecologist, [facility] for [employer], [facility]

## 2020-12-17 NOTE — Progress Notes (Signed)
OT Cancellation Note  Patient Details Name: Ashley Camacho MRN: [id number] DOB: Apr 09, 1991   Cancelled Treatment:    Reason Eval/Treat Not Completed: Medical issues which prohibited therapy Pt has been sick all night and not stable for therapy at this time. Will follow.   Alphia Moh OTR/L  Acute Rehab Services  [phone] office number [phone] pager number  Alphia Moh 12/17/2020, 12:03 PM

## 2020-12-17 NOTE — Progress Notes (Signed)
G2P1 at 25 2/7 weeks is inpatient on 5W12 for DKA, anemia, hyperemesis, pneumonia, pulmonary edema.  On Lasix and Heparin.  HX of cesarean section for fetal distress. FHT's 150's.  No OB issues at this time. Dr Donavan Foil updated on status.  Possible change in monitoring to include an NST weekly.  Will go to 5W to do a formal consult this morning.

## 2020-12-17 NOTE — Progress Notes (Signed)
Foley removed at this time as per order.

## 2020-12-17 NOTE — Progress Notes (Signed)
G2P1 at [redacted]w[redacted]d inpatient on 5W12 for DKA, anemia, hyperemesis, pneumonia, pulmonary edema.  On Lasix and Heparin.  HX of cesarean section for fetal distress. FHT doppler'ed, range of 141-150.  No OB issues at this time.

## 2020-12-17 NOTE — Progress Notes (Signed)
Rec'd call from IR regarding coretrak  placement and exposure to radiation. They have requested to reach out to coretrak team for bedside placement. Paged [facility], awaiting response.

## 2020-12-17 NOTE — Consult Note (Addendum)
PHARMACY - TOTAL PARENTERAL NUTRITION CONSULT NOTE   Indication:  inability to tolerate post-pyloric dobhoff and signs of starvation ISO pregnancy  Patient Measurements: There is no height or weight on file to calculate BMI. Usual Weight: 57kg   Assessment: 30 year old female at 7-[redacted] weeks gestation with a h/o type 1 diabetes. Patient with hyperemesis gravidarum. 1 month or greater of poor po intake. Patient did not tolerate NGT at [facility] and currently does not want to attempt post-pyloric dobhoff placement. She was started on TPN 7/25 for hyperemesis gravidarum, severe PCM from poor po intake.  - PMH: DM type 1, gastroparesis, HTN, CHF, h/o DVT, anemia, recently treated at [facility] for sepsis secondary to pneumonia and was there for 2 weeks.  Glucose / Insulin: CBGs 89-211 on insulin infusion (Endotool goal should be 90-120 in pregnancy)  Electrolytes: Starvation ketoacidosis: K 3.6, CoCa 9, Mg 1.4 low, Phos 3.7 WNL. - 7/27: FA 19.8 WNL, B12 1146 ok  Renal: Scr 1, good UOP  Hepatic: AST/ALT WNL; Tbili 3.3>1, prealbumin only 5.8.  Intake / Output; MIVF: UOP 2250, +BMs, Emesis noted x 6. - Pt with HF (EF 45%)  with 3rd spacing of fluids.--IV Lasix  GI: Albumin 25g BID scheduled, Pepcid 20mg  IV q12h  GI Imaging: N/A  GI Surgeries / Procedures: N/A  Central access: PICC (7/24) TPN start date: 7/25 PM  Nutritional Goals (per RD recommendation on 7/26): kCal: 1900-2200 Protein: 85-95g of protein, Fluid: 1.4-1.7 L/day (has HF) Goal TPN rate is 65 mL/hr (provides 95 g of protein and 1966 kcals per day)  Current Nutrition: TPN  Plan:  Plan TF via transpyloric Cortrak to be placed.  TPN to goal 65 mL/hr at 1800 to provide 95g protein 57.8g lipids (29%), 296g dextrose (GIR 3.6), and 1966 total kcal.  Electrolytes in TPN: Na 7mEq/L, K 33mEq/L, Ca 12mEq/L, Mg 41mEq/L, and Phos 88mmol/L. Cl:Ac 1:1  Magnesium 4g IV x 1 today  Add standard MVI and trace elements to TPN  (not taking prenatal vit)  Add folic acid and thiamine to TPN for 3 days (ned 7/28)  Continue insulin drip  Monitor TPN labs on Mon/Thurs, and PRN   Lucelia Lacey S. 8/28, PharmD, BCPS Clinical Staff Pharmacist [email or web address] Merilynn Finland, PharmD, BCPS 12/17/2020,8:42 AM

## 2020-12-17 NOTE — Progress Notes (Signed)
PT Cancellation Note  Patient Details Name: Ashley Camacho MRN: [id number] DOB: Oct 16, 1990   Cancelled Treatment:    Reason Eval/Treat Not Completed: Medical issues which prohibited therapy Discussed case with RN, patient not quite medically ready for PT just yet. Will continue to follow and initiate eval when appropriate.   Madelaine Etienne, DPT, PN1   Supplemental Physical Therapist [facility]    Pager [phone] Acute Rehab Office [phone]

## 2020-12-17 NOTE — Progress Notes (Signed)
Echocardiogram 2D Echocardiogram has been performed.  Warren Lacy Reem Fleury RDCS 12/17/2020, 9:15 AM

## 2020-12-17 NOTE — Progress Notes (Signed)
   12/16/20 2207  Assess: MEWS Score  Temp 98.8 F (37.1 C)  BP 139/74  Pulse Rate (!) 109  Resp (!) 22  Level of Consciousness Alert  SpO2 94 %  O2 Device Nasal Cannula  O2 Flow Rate (L/min) 1 L/min  Assess: MEWS Score  MEWS Temp 0  MEWS Systolic 0  MEWS Pulse 1  MEWS RR 1  MEWS LOC 0  MEWS Score 2  MEWS Score Color Yellow  Assess: if the MEWS score is Yellow or Red  Were vital signs taken at a resting state? Yes  Focused Assessment No change from prior assessment  Early Detection of Sepsis Score *See Row Information* Low  MEWS guidelines implemented *See Row Information* No, previously yellow, continue vital signs every 4 hours  Treat  Pain Scale 0-10  Pain Score 0  Complains of Nausea /  Vomiting  Interventions Other (comment) (MD notified- need orders)  Take Vital Signs  Increase Vital Sign Frequency  Yellow: Q 2hr X 2 then Q 4hr X 2, if remains yellow, continue Q 4hrs  Escalate  MEWS: Escalate Yellow: discuss with charge nurse/RN and consider discussing with provider and RRT  Notify: Charge Nurse/RN  Name of Charge Nurse/RN Notified [place] RN  Date Charge Nurse/RN Notified 12/16/20  Time Charge Nurse/RN Notified 2207  Notify: Provider  Provider Name/Title Toniann Fail MD  Date Provider Notified 12/16/20  Time Provider Notified 2207  Notification Type Call  Notification Reason Other (Comment) (new admission)  Provider response See new orders  Document  Patient Outcome Stabilized after interventions  Progress note created (see row info) Yes

## 2020-12-17 NOTE — Progress Notes (Signed)
5176[name] at bedside. Doppler in the 150s. Pt w/consistent dry cough. Reports no LOF, cramping, bleeding, positive fetal movement. No OB complaints at this time.

## 2020-12-18 DIAGNOSIS — E10319 Type 1 diabetes mellitus with unspecified diabetic retinopathy without macular edema: Secondary | ICD-10-CM | POA: Diagnosis not present

## 2020-12-18 DIAGNOSIS — I82621 Acute embolism and thrombosis of deep veins of right upper extremity: Secondary | ICD-10-CM | POA: Diagnosis not present

## 2020-12-18 DIAGNOSIS — I5043 Acute on chronic combined systolic (congestive) and diastolic (congestive) heart failure: Secondary | ICD-10-CM

## 2020-12-18 DIAGNOSIS — N179 Acute kidney failure, unspecified: Secondary | ICD-10-CM | POA: Diagnosis not present

## 2020-12-18 LAB — COMPREHENSIVE METABOLIC PANEL
ALT: 8 U/L (ref 0–44)
AST: 15 U/L (ref 15–41)
Albumin: 2.7 g/dL — ABNORMAL LOW (ref 3.5–5.0)
Alkaline Phosphatase: 67 U/L (ref 38–126)
Anion gap: 8 (ref 5–15)
BUN: 13 mg/dL (ref 6–20)
CO2: 30 mmol/L (ref 22–32)
Calcium: 8.4 mg/dL — ABNORMAL LOW (ref 8.9–10.3)
Chloride: 99 mmol/L (ref 98–111)
Creatinine, Ser: 1.06 mg/dL — ABNORMAL HIGH (ref 0.44–1.00)
GFR, Estimated: >60 mL/min (ref >60)
Glucose, Bld: 122 mg/dL — ABNORMAL HIGH (ref 70–99)
Potassium: 3.6 mmol/L (ref 3.5–5.1)
Sodium: 137 mmol/L (ref 135–145)
Total Bilirubin: 1.4 mg/dL — ABNORMAL HIGH (ref 0.3–1.2)
Total Protein: 5 g/dL — ABNORMAL LOW (ref 6.5–8.1)

## 2020-12-18 LAB — GLUCOSE, CAPILLARY
Glucose-Capillary: 106 mg/dL — ABNORMAL HIGH (ref 70–99)
Glucose-Capillary: 107 mg/dL — ABNORMAL HIGH (ref 70–99)
Glucose-Capillary: 111 mg/dL — ABNORMAL HIGH (ref 70–99)
Glucose-Capillary: 115 mg/dL — ABNORMAL HIGH (ref 70–99)
Glucose-Capillary: 121 mg/dL — ABNORMAL HIGH (ref 70–99)
Glucose-Capillary: 127 mg/dL — ABNORMAL HIGH (ref 70–99)
Glucose-Capillary: 127 mg/dL — ABNORMAL HIGH (ref 70–99)
Glucose-Capillary: 131 mg/dL — ABNORMAL HIGH (ref 70–99)
Glucose-Capillary: 131 mg/dL — ABNORMAL HIGH (ref 70–99)
Glucose-Capillary: 131 mg/dL — ABNORMAL HIGH (ref 70–99)
Glucose-Capillary: 133 mg/dL — ABNORMAL HIGH (ref 70–99)
Glucose-Capillary: 138 mg/dL — ABNORMAL HIGH (ref 70–99)
Glucose-Capillary: 142 mg/dL — ABNORMAL HIGH (ref 70–99)
Glucose-Capillary: 145 mg/dL — ABNORMAL HIGH (ref 70–99)
Glucose-Capillary: 146 mg/dL — ABNORMAL HIGH (ref 70–99)
Glucose-Capillary: 146 mg/dL — ABNORMAL HIGH (ref 70–99)
Glucose-Capillary: 150 mg/dL — ABNORMAL HIGH (ref 70–99)
Glucose-Capillary: 151 mg/dL — ABNORMAL HIGH (ref 70–99)
Glucose-Capillary: 156 mg/dL — ABNORMAL HIGH (ref 70–99)
Glucose-Capillary: 157 mg/dL — ABNORMAL HIGH (ref 70–99)
Glucose-Capillary: 157 mg/dL — ABNORMAL HIGH (ref 70–99)
Glucose-Capillary: 160 mg/dL — ABNORMAL HIGH (ref 70–99)
Glucose-Capillary: 160 mg/dL — ABNORMAL HIGH (ref 70–99)
Glucose-Capillary: 168 mg/dL — ABNORMAL HIGH (ref 70–99)

## 2020-12-18 LAB — CBC
HCT: 24.9 % — ABNORMAL LOW (ref 36.0–46.0)
Hemoglobin: 8.1 g/dL — ABNORMAL LOW (ref 12.0–15.0)
MCH: 28.7 pg (ref 26.0–34.0)
MCHC: 32.5 g/dL (ref 30.0–36.0)
MCV: 88.3 fL (ref 80.0–100.0)
Platelets: 186 10*3/uL (ref 150–400)
RBC: 2.82 MIL/uL — ABNORMAL LOW (ref 3.87–5.11)
RDW: 15.4 % (ref 11.5–15.5)
WBC: 5.1 10*3/uL (ref 4.0–10.5)
nRBC: 0 % (ref 0.0–0.2)

## 2020-12-18 LAB — PHOSPHORUS: Phosphorus: 4.2 mg/dL (ref 2.5–4.6)

## 2020-12-18 LAB — MAGNESIUM: Magnesium: 2 mg/dL (ref 1.7–2.4)

## 2020-12-18 LAB — BETA-HYDROXYBUTYRIC ACID: Beta-Hydroxybutyric Acid: <0.05 mmol/L — ABNORMAL LOW (ref 0.05–0.27)

## 2020-12-18 MED ORDER — METHYLPREDNISOLONE 4 MG PO TABS: 4.0000 mg | ORAL_TABLET | Freq: Every day | ORAL | Status: DC

## 2020-12-18 MED ORDER — ACETAMINOPHEN 650 MG RE SUPP
650.0000 mg | Freq: Four times a day (QID) | RECTAL | Status: DC | PRN
  Filled 2020-12-18: qty 1

## 2020-12-18 MED ORDER — METHYLPREDNISOLONE 4 MG PO TABS: 8.0000 mg | ORAL_TABLET | Freq: Every day | ORAL | Status: DC

## 2020-12-18 MED ORDER — METHYLPREDNISOLONE SODIUM SUCC 125 MG IJ SOLR
48.0000 mg | Freq: Once | INTRAMUSCULAR | Status: AC
  Administered 2020-12-18: 48 mg via INTRAVENOUS
  Filled 2020-12-18: qty 2

## 2020-12-18 MED ORDER — ACETAMINOPHEN 325 MG PO TABS
650.0000 mg | ORAL_TABLET | Freq: Four times a day (QID) | ORAL | Status: DC | PRN
  Administered 2020-12-24 – 2021-01-10 (×10): 650 mg
  Filled 2020-12-18 (×11): qty 2

## 2020-12-18 MED ORDER — MIRTAZAPINE 15 MG PO TBDP
15.0000 mg | ORAL_TABLET | Freq: Every day | ORAL | Status: DC
  Administered 2020-12-18: 15 mg
  Filled 2020-12-18 (×3): qty 1

## 2020-12-18 MED ORDER — METHYLPREDNISOLONE 16 MG PO TABS: 16.0000 mg | ORAL_TABLET | Freq: Every day | ORAL | Status: DC

## 2020-12-18 MED ORDER — PHENOL 1.4 % MT LIQD
1.0000 | OROMUCOSAL | Status: DC | PRN
  Administered 2020-12-19: 1 via OROMUCOSAL
  Filled 2020-12-18: qty 177

## 2020-12-18 MED ORDER — METHYLPREDNISOLONE 4 MG PO TABS
8.0000 mg | ORAL_TABLET | Freq: Every day | ORAL | Status: DC
Start: 2020-12-22 — End: 2020-12-18

## 2020-12-18 MED ORDER — METHYLPREDNISOLONE 4 MG PO TABS
8.0000 mg | ORAL_TABLET | Freq: Every day | ORAL | Status: DC
Start: 2020-12-23 — End: 2020-12-18

## 2020-12-18 MED ORDER — GUAIFENESIN-DM 100-10 MG/5ML PO SYRP
10.0000 mL | ORAL_SOLUTION | Freq: Four times a day (QID) | ORAL | Status: DC | PRN
  Administered 2020-12-18: 10 mL
  Filled 2020-12-18: qty 10

## 2020-12-18 MED ORDER — ENOXAPARIN SODIUM 80 MG/0.8ML IJ SOSY
70.0000 mg | PREFILLED_SYRINGE | Freq: Two times a day (BID) | INTRAMUSCULAR | Status: DC
  Administered 2020-12-18 – 2020-12-19 (×4): 70 mg via SUBCUTANEOUS
  Filled 2020-12-18 (×4): qty 0.8

## 2020-12-18 MED ORDER — PRENATAL MULTIVITAMIN CH
1.0000 | ORAL_TABLET | Freq: Every day | ORAL | Status: DC
  Administered 2020-12-18 – 2020-12-28 (×10): 1
  Filled 2020-12-18 (×13): qty 1

## 2020-12-18 MED ORDER — METHYLPREDNISOLONE 16 MG PO TABS
16.0000 mg | ORAL_TABLET | Freq: Every day | ORAL | Status: DC
Start: 2020-12-19 — End: 2020-12-18

## 2020-12-18 MED ORDER — ALTEPLASE 2 MG IJ SOLR
2.0000 mg | Freq: Once | INTRAMUSCULAR | Status: AC
  Administered 2020-12-18: 2 mg
  Filled 2020-12-18: qty 2

## 2020-12-18 MED ORDER — ORAL CARE MOUTH RINSE
15.0000 mL | Freq: Two times a day (BID) | OROMUCOSAL | Status: DC
  Administered 2020-12-19 – 2021-02-17 (×91): 15 mL via OROMUCOSAL

## 2020-12-18 MED ORDER — METOCLOPRAMIDE HCL 5 MG/ML IJ SOLN
5.0000 mg | Freq: Four times a day (QID) | INTRAMUSCULAR | Status: DC
  Administered 2020-12-18 – 2021-02-12 (×217): 5 mg via INTRAVENOUS
  Filled 2020-12-18 (×149): qty 2
  Filled 2020-12-18: qty 1
  Filled 2020-12-18 (×69): qty 2

## 2020-12-18 MED ORDER — THIAMINE HCL 100 MG/ML IJ SOLN
500.0000 mg | INTRAVENOUS | Status: DC
  Administered 2020-12-18 – 2020-12-29 (×12): 500 mg via INTRAVENOUS
  Filled 2020-12-18 (×13): qty 5

## 2020-12-18 MED ORDER — LACTATED RINGERS IV SOLN: INTRAVENOUS | Status: DC

## 2020-12-18 MED ORDER — TRACE MINERALS CU-MN-SE-ZN 300-55-60-3000 MCG/ML IV SOLN
INTRAVENOUS | Status: DC
  Filled 2020-12-18: qty 634.4

## 2020-12-18 MED ORDER — FERROUS SULFATE 300 (60 FE) MG/5ML PO SYRP
300.0000 mg | ORAL_SOLUTION | Freq: Every day | ORAL | Status: DC
  Administered 2020-12-18 – 2020-12-26 (×9): 300 mg
  Filled 2020-12-18 (×10): qty 5

## 2020-12-18 MED ORDER — FUROSEMIDE 10 MG/ML IJ SOLN
40.0000 mg | Freq: Every day | INTRAMUSCULAR | Status: DC
  Administered 2020-12-18 – 2020-12-22 (×5): 40 mg via INTRAVENOUS
  Filled 2020-12-18 (×5): qty 4

## 2020-12-18 MED ORDER — BACITRACIN ZINC 500 UNIT/GM EX OINT
TOPICAL_OINTMENT | Freq: Two times a day (BID) | CUTANEOUS | Status: DC
  Administered 2020-12-18 – 2020-12-28 (×11): 1 via TOPICAL
  Filled 2020-12-18 (×2): qty 28.4

## 2020-12-18 MED ORDER — SCOPOLAMINE 1 MG/3DAYS TD PT72
1.0000 | MEDICATED_PATCH | TRANSDERMAL | Status: DC
  Administered 2020-12-18 – 2021-01-11 (×9): 1.5 mg via TRANSDERMAL
  Filled 2020-12-18 (×11): qty 1

## 2020-12-18 MED ORDER — FERROUS SULFATE 300 (60 FE) MG/5ML PO SYRP
300.0000 mg | ORAL_SOLUTION | Freq: Every day | ORAL | Status: DC
  Filled 2020-12-18: qty 5

## 2020-12-18 NOTE — Evaluation (Signed)
Occupational Therapy Evaluation Patient Details Name: Ashley Camacho MRN: [id number] DOB: 08-May-1991 Today's Date: 12/18/2020    History of Present Illness 30 y.o. female admitted for hyperemesis gravidarum. Medical history significant of [redacted]w[redacted]d on presentation, Estimated Date of Delivery: 03/30/21 by by 9 week ultrasound, Type 1 diabetic, Cardiomyopathy, chronic systolic heart failure, DVT in bilateral arm. H/o recent hospitalizations d/t hyperemesis and sepsis secondary to pneumonia   Clinical Impression   Pt admitted with the above diagnoses and presents with below problem list. Pt will benefit from continued acute OT to address the below listed deficits and maximize independence with basic ADLs prior to d/c home. Pt presents with decreased activity tolerance and generalized weakness impacting assist level with basic ADLs. Pt currently needs max A with most ADLs. Low volume speech and feeling very poorly throughout session. Pt reports she lives with family and has someone there 24/7. Pt has 2 yo daughter being cared for currently by family per pt report. Pt completed UB/LB bathing and pivotal steps to sit up in recliner. Extra time, effort, and cautious with movements.      Follow Up Recommendations  No OT follow up;Supervision/Assistance - 24 hour    Equipment Recommendations  None recommended by OT    Recommendations for Other Services       Precautions / Restrictions Precautions Precautions: Fall Restrictions Weight Bearing Restrictions: No Other Position/Activity Restrictions: feeding tube through nose, PICC line      Mobility Bed Mobility Overal bed mobility: Needs Assistance Bed Mobility: Rolling;Supine to Sit Rolling: Min assist   Supine to sit: Mod assist;HOB elevated     General bed mobility comments: assist to roll to full sidelying position. Assist to powerup trunk to come from supine to EOB. Cautious movements, extra time and effort.    Transfers Overall  transfer level: Needs assistance Equipment used: 1 person hand held assist;None Transfers: Sit to/from [employer] Sit to Stand: Min assist;Min guard Stand pivot transfers: Min guard       General transfer comment: Min A on initial stand. Close min guard for pivotal steps to recliner. Pt reaching for single UE support. Pt feeling very poorly.    Balance Overall balance assessment: Mild deficits observed, not formally tested                                         ADL either performed or assessed with clinical judgement   ADL Overall ADL's : Needs assistance/impaired Eating/Feeding: NPO   Grooming: Maximal assistance   Upper Body Bathing: Maximal assistance;Sitting   Lower Body Bathing: Maximal assistance;Bed level   Upper Body Dressing : Maximal assistance;Sitting   Lower Body Dressing: Maximal assistance;Bed level   Toilet Transfer: Min Barrister's clerk Details (indicate cue type and reason): pivotal steps from EOB to recliner. min guard for safety. Pt reaching out hand for external support. light steadying assist given. Toileting- Clothing Manipulation and Hygiene: Maximal assistance;Bed level;Sit to/from stand         General ADL Comments: Pt presents with decreased activity tolerance and decreased strength. UB bathing with pt sitting EOB, LB bathing completed mostly at bed level a bit in standing. Pt cautiously took pivotal steps to sit up in recliner a bit. Able to maintain static standing about a minute prior to sitting.     Vision         Perception  Praxis      Pertinent Vitals/Pain Pain Assessment: Faces Faces Pain Scale: Hurts even more Pain Location: grimacing during pericare Pain Descriptors / Indicators: Grimacing;Guarding Pain Intervention(s): Repositioned;Monitored during session;Limited activity within patient's tolerance     Hand Dominance     Extremity/Trunk Assessment Upper Extremity  Assessment Upper Extremity Assessment: Overall WFL for tasks assessed;Generalized weakness   Lower Extremity Assessment Lower Extremity Assessment: Defer to PT evaluation   Cervical / Trunk Assessment Cervical / Trunk Assessment: Other exceptions Cervical / Trunk Exceptions: second trimester of pregnancy   Communication Communication Communication: Other (comment) (soft volume, limited verbalizations)   Cognition Arousal/Alertness: Awake/alert Behavior During Therapy: WFL for tasks assessed/performed (appropriate for current situation) Overall Cognitive Status: Within Functional Limits for tasks assessed                                 General Comments: limited verbalizations but appropriate for situation.   General Comments  Pt with n/v intermittently during session. NTs present throughout session.    Exercises     Shoulder Instructions      Home Living Family/patient expects to be discharged to:: Private residence Living Arrangements: Other (Comment) ("my family") Available Help at Discharge: Available 24 hours/day                             Additional Comments: unable to obtain full PLOF and home setup. Pt able to report she lives with multiple family members. She has a 34 yo daughter being cared for by family currently.      Prior Functioning/Environment Level of Independence: Independent        Comments: suspect she is independent with ADLs at baseline. Unable to confirm this session.        OT Problem List: Decreased strength;Decreased activity tolerance;Impaired balance (sitting and/or standing);Decreased knowledge of use of DME or AE;Decreased knowledge of precautions;Cardiopulmonary status limiting activity;Increased edema      OT Treatment/Interventions: Self-care/ADL training;Energy conservation;DME and/or AE instruction;Therapeutic activities;Patient/family education;Balance training    OT Goals(Current goals can be found in  the care plan section) Acute Rehab OT Goals Patient Stated Goal: did not state OT Goal Formulation: With patient Time For Goal Achievement: 01/01/21 Potential to Achieve Goals: Good ADL Goals Pt Will Perform Grooming: with set-up;sitting Pt Will Perform Lower Body Bathing: with min assist;sit to/from stand Pt Will Perform Lower Body Dressing: with min assist;sit to/from stand Pt Will Transfer to Toilet: with supervision;ambulating Pt Will Perform Toileting - Clothing Manipulation and hygiene: with min assist;sit to/from stand  OT Frequency: Min 2X/week   Barriers to D/C:            Co-evaluation              AM-PAC OT "6 Clicks" Daily Activity     Outcome Measure Help from another person eating meals?: Total Help from another person taking care of personal grooming?: A Lot Help from another person toileting, which includes using toliet, bedpan, or urinal?: A Lot Help from another person bathing (including washing, rinsing, drying)?: A Lot Help from another person to put on and taking off regular upper body clothing?: A Lot Help from another person to put on and taking off regular lower body clothing?: A Lot 6 Click Score: 11   End of Session Equipment Utilized During Treatment: Oxygen Nurse Communication: Mobility status;Other (comment) (Nursing staff present during session)  Activity Tolerance: Patient limited by fatigue;Other (comment) (n/v) Patient left: in chair;with call bell/phone within reach;with nursing/sitter in room  OT Visit Diagnosis: Unsteadiness on feet (R26.81);Muscle weakness (generalized) (M62.81);Pain;Feeding difficulties (R63.3)                Time: 7858-8502 OT Time Calculation (min): 32 min Charges:  OT General Charges $OT Visit: 1 Visit OT Evaluation $OT Eval Moderate Complexity: 1 Mod OT Treatments $Self Care/Home Management : 8-22 mins  Raynald Kemp, OT Acute Rehabilitation Services Pager: [phone] Office:  [phone]   Pilar Grammes 12/18/2020, 1:36 PM

## 2020-12-18 NOTE — Progress Notes (Addendum)
Inpatient Diabetes Program Recommendations  Diabetes Treatment Program Recommendations  ADA Standards of Care 2018 Diabetes in Pregnancy Target Glucose Ranges:  Fasting: 60 - 90 mg/dL Preprandial: 60 - 374 mg/dL 1 hr postprandial: Less than 140mg /dL (from first bite of meal) 2 hr postprandial: Less than 120 mg/dL (from first bite of meal)      Lab Results  Component Value Date   GLUCAP 146 (H) 12/18/2020   HGBA1C 5.8 (H) 11/21/2020    Review of Glycemic Control Results for Ashley Camacho, Ashley Camacho (MRN [name]) as of 12/18/2020 09:57  Ref. Range 12/18/2020 07:28 12/18/2020 08:39 12/18/2020 09:34  Glucose-Capillary Latest Ref Range: 70 - 99 mg/dL 12/20/2020 (H) 449 (H) 201 (H)   Diabetes history: Type 1 DM Current orders for Inpatient glycemic control: IV insulin Osmolite @ 65 ml/hr  TPN- No insulin added  Inpatient Diabetes Program Recommendations:    Continue with insulin drip for now b/c it is safest with both TPN and enteral feeds.  EndoTool will adjust as feeds added. Please do not transition off insulin drip without discussing with Diabetes Coordinator and pharmacy to make sure that insulin needs are covered adequately.  Spoke with patient and performed assessment. Patient actively vomiting. Answered questions appropriately. 007, RN at bedside discussing patient's plan of care. Education provided to ensure IV insulin continues.   @1100  Spoke with Dr Baxter Hire regarding plan of care. Noted MFM consult placed; no note yet. Also, noted order for transfer to specialty OB.  Discussed viability and FHR monitoring given patient presentation with acidosis. Order set recommending continuous heart rate monitoring. Unable to perform on 5W. OBRRRN performing Qshift Dopplers. MD contacting MFM for additional recommendations.  Additionally, MD notified of difficulty to care for patient at standards with target goals of 90-120 mg/dl in pregnancy which can be achieved using Endotool. Unfortunately, 5W does  not have the settings and cannot manually override. Anticipate need for IV insulin for days given patient's sensitivity, continued vomiting, TPN, Tube feeds and lack of tolerating feeds. Discussed Decadron taper which also would be covered under IV insulin. No orders received at this time. Donavan Foil, house coverage, also participating in conversation for concern of bed placement.  @1120 -Spoke with Dr 04-05-2006, MFM regarding plan of care. Informed of difficulty with target goals on 5W without possibility of manual override, discussed plan for Decadron taper, pt currently vomiting, need for continued IV insulin and concern for Q shift Dopplers in patient with high risk of acidosis. MD plans to reach out to OB.  @1430 : Further discussed steroids, insulin and FHR monitoring with Dr Ferdinand Lango, Dr , and Dr Grace Bushy. Patient to remain on IV insulin, thus drip rates adjust hourly to prevent DKA. Plan to transfer patient to Specialty OB. Will continue to follow daily.  Thanks, , MSN, RNC-OB Diabetes Coordinator [phone] (8a-5p)

## 2020-12-18 NOTE — Progress Notes (Signed)
FACULTY PRACTICE ANTEPARTUM PROGRESS NOTE  Ashley Camacho is a 30 y.o. G2P0101 at [redacted]w[redacted]d who is admitted for uncontrolled Type 1 DM, hyperemesis gravidarum along with multiple other chronic illnesses.  Estimated Date of Delivery: 03/30/21 Fetal presentation is unsure.  Length of Stay:  2 Days. Admitted 12/16/2020  Subjective: Pt is ill appearing with NG tube in place and functioning.  She still notes nausea   She denies uterine contractions, denies bleeding and leaking of fluid per vagina.  Vitals:  Blood pressure 125/61, pulse (!) 109, temperature 98 F (36.7 C), temperature source Axillary, resp. rate 19, height 5\' 1"  (1.549 m), weight 72.9 kg, SpO2 96 %. Physical Examination: CONSTITUTIONAL: Well-developed, well-nourished female in no acute distress.  HENT:  Normocephalic, atraumatic, External right and left ear normal. Oropharynx is clear and moist EYES: Conjunctivae and EOM are normal.  NECK: Normal range of motion, supple, no masses. SKIN: Skin is warm and dry. No rash noted. Not diaphoretic. No erythema. No pallor. NEUROLGIC: Alert and oriented to person, place, and time. Normal reflexes, muscle tone coordination. No cranial nerve deficit noted. PSYCHIATRIC: Normal mood and affect. Normal behavior. Normal judgment and thought content. MUSCULOSKELETAL: Normal range of motion. No edema and no tenderness. ABDOMEN: Soft, nontender, nondistended, gravid. CERVIX: deferred  Fetal monitoring: FHR: 150's Uterine activity: toco Results for orders placed or performed during the hospital encounter of 12/16/20 (from the past 48 hour(s))  Glucose, capillary     Status: Abnormal   Collection Time: 12/16/20 10:05 PM  Result Value Ref Range   Glucose-Capillary 120 (H) 70 - 99 mg/dL    Comment: Glucose reference range applies only to samples taken after fasting for at least 8 hours.  Glucose, capillary     Status: Abnormal   Collection Time: 12/17/20 12:08 AM  Result Value Ref Range    Glucose-Capillary 174 (H) 70 - 99 mg/dL    Comment: Glucose reference range applies only to samples taken after fasting for at least 8 hours.  Urinalysis, Routine w reflex microscopic Urine, Catheterized     Status: Abnormal   Collection Time: 12/17/20 12:21 AM  Result Value Ref Range   Color, Urine YELLOW YELLOW   APPearance CLEAR CLEAR   Specific Gravity, Urine 1.009 1.005 - 1.030   pH 5.0 5.0 - 8.0   Glucose, UA NEGATIVE NEGATIVE mg/dL   Hgb urine dipstick MODERATE (A) NEGATIVE   Bilirubin Urine NEGATIVE NEGATIVE   Ketones, ur NEGATIVE NEGATIVE mg/dL   Protein, ur 30 (A) NEGATIVE mg/dL   Nitrite NEGATIVE NEGATIVE   Leukocytes,Ua SMALL (A) NEGATIVE   RBC / HPF 6-10 0 - 5 RBC/hpf   WBC, UA 11-20 0 - 5 WBC/hpf   Bacteria, UA RARE (A) NONE SEEN   Squamous Epithelial / LPF 0-5 0 - 5   Mucus PRESENT    Hyaline Casts, UA PRESENT     Comment: Performed at [facility], [address] [place], [place] [address]  Sodium, urine, random     Status: None   Collection Time: 12/17/20 12:24 AM  Result Value Ref Range   Sodium, Ur 134 mmol/L    Comment: Performed at [facility], [address] [place], [place] [address]  Creatinine, urine, random     Status: None   Collection Time: 12/17/20 12:24 AM  Result Value Ref Range   Creatinine, Urine 54.33 mg/dL    Comment: Performed at [facility], [address], [place], [place] [address]  Magnesium  Status: Abnormal   Collection Time: 12/17/20 12:49 AM  Result Value Ref Range   Magnesium 1.4 (L) 1.7 - 2.4 mg/dL    Comment: Performed at [facility], [address], [place], [place] [address]  Phosphorus     Status: None   Collection Time: 12/17/20 12:49 AM  Result Value Ref Range   Phosphorus 3.7 2.5 - 4.6 mg/dL    Comment: Performed at [facility], [address], [place], [place] [address]  CBC WITH DIFFERENTIAL     Status: Abnormal   Collection Time: 12/17/20 12:49 AM  Result Value Ref Range   WBC  6.1 4.0 - 10.5 K/uL   RBC 2.48 (L) 3.87 - 5.11 MIL/uL   Hemoglobin 7.5 (L) 12.0 - 15.0 g/dL   HCT 09.8 (L) 11.9 - 14.7 %   MCV 89.9 80.0 - 100.0 fL   MCH 30.2 26.0 - 34.0 pg   MCHC 33.6 30.0 - 36.0 g/dL   RDW 82.9 56.2 - 13.0 %   Platelets 177 150 - 400 K/uL   nRBC 0.0 0.0 - 0.2 %   Neutrophils Relative % 70 %   Neutro Abs 4.3 1.7 - 7.7 K/uL   Lymphocytes Relative 17 %   Lymphs Abs 1.1 0.7 - 4.0 K/uL   Monocytes Relative 6 %   Monocytes Absolute 0.4 0.1 - 1.0 K/uL   Eosinophils Relative 6 %   Eosinophils Absolute 0.4 0.0 - 0.5 K/uL   Basophils Relative 1 %   Basophils Absolute 0.0 0.0 - 0.1 K/uL   Immature Granulocytes 0 %   Abs Immature Granulocytes 0.02 0.00 - 0.07 K/uL    Comment: Performed at [facility], [address], [place], [place] [address]  TSH     Status: None   Collection Time: 12/17/20 12:49 AM  Result Value Ref Range   TSH 3.907 0.350 - 4.500 uIU/mL    Comment: Performed by a 3rd Generation assay with a functional sensitivity of <=0.01 uIU/mL. Performed at [facility], [address], [place], [place] [address]   Comprehensive metabolic panel     Status: Abnormal   Collection Time: 12/17/20 12:49 AM  Result Value Ref Range   Sodium 138 135 - 145 mmol/L   Potassium 3.6 3.5 - 5.1 mmol/L   Chloride 105 98 - 111 mmol/L   CO2 25 22 - 32 mmol/L   Glucose, Bld 207 (H) 70 - 99 mg/dL    Comment: Glucose reference range applies only to samples taken after fasting for at least 8 hours.   BUN <5 (L) 6 - 20 mg/dL   Creatinine, Ser 9.52 (H) 0.44 - 1.00 mg/dL   Calcium 7.9 (L) 8.9 - 10.3 mg/dL   Total Protein 4.9 (L) 6.5 - 8.1 g/dL   Albumin 2.6 (L) 3.5 - 5.0 g/dL   AST 16 15 - 41 U/L   ALT 10 0 - 44 U/L   Alkaline Phosphatase 72 38 - 126 U/L   Total Bilirubin 1.0 0.3 - 1.2 mg/dL   GFR, Estimated >84 >13 mL/min    Comment: (NOTE) Calculated using the CKD-EPI Creatinine Equation (2021)    Anion gap 8 5 - 15    Comment: Performed at [facility], [address], [place], [place] [address]  Prealbumin     Status: Abnormal   Collection Time: 12/17/20 12:49 AM  Result Value Ref Range   Prealbumin 5.8 (L) 18 - 38 mg/dL  Comment: Performed at [facility], [address] [place], [place] [address]  Brain natriuretic peptide     Status: Abnormal   Collection Time: 12/17/20 12:49 AM  Result Value Ref Range   B Natriuretic Peptide 2,128.9 (H) 0.0 - 100.0 pg/mL    Comment: Performed at [facility], [address], [place], [place] [address]  CK     Status: None   Collection Time: 12/17/20 12:49 AM  Result Value Ref Range   Total CK 55 38 - 234 U/L    Comment: Performed at [facility], [address] [place], [place] [address]  CK     Status: None   Collection Time: 12/17/20 12:49 AM  Result Value Ref Range   Total CK 54 38 - 234 U/L    Comment: Performed at [facility], [address], [place], [place] [address]  Vitamin B12     Status: Abnormal   Collection Time: 12/17/20 12:49 AM  Result Value Ref Range   Vitamin B-12 1,146 (H) 180 - 914 pg/mL    Comment: (NOTE) This assay is not validated for testing neonatal or myeloproliferative syndrome specimens for Vitamin B12 levels. Performed at [facility], [address], [place], [place] [address]   Folate     Status: None   Collection Time: 12/17/20 12:49 AM  Result Value Ref Range   Folate 19.8 >5.9 ng/mL    Comment: Performed at [facility], [address], [place], [place] [address]  Iron and TIBC     Status: Abnormal   Collection Time: 12/17/20 12:49 AM  Result Value Ref Range   Iron 50 28 - 170 ug/dL   TIBC 826 (L) 415 - 830 ug/dL   Saturation Ratios 40 (H) 10.4 - 31.8 %   UIBC 75 ug/dL    Comment: Performed at [facility], [address], [place], [place] [address]  Ferritin     Status: Abnormal   Collection Time: 12/17/20 12:49 AM  Result Value Ref Range   Ferritin 681 (H) 11 - 307 ng/mL    Comment:  Performed at [facility], [address], [place], [place] [address]  Reticulocytes     Status: Abnormal   Collection Time: 12/17/20 12:49 AM  Result Value Ref Range   Retic Ct Pct 1.9 0.4 - 3.1 %   RBC. 2.48 (L) 3.87 - 5.11 MIL/uL   Retic Count, Absolute 46.6 19.0 - 186.0 K/uL   Immature Retic Fract 14.7 2.3 - 15.9 %    Comment: Performed at [facility], [address], [place], [place] [address]  Type and screen [facility]     Status: None   Collection Time: 12/17/20 12:49 AM  Result Value Ref Range   ABO/RH(D) A POS    Antibody Screen NEG    Sample Expiration 12/20/2020,2359    Unit Number [id number]    Blood Component Type RED CELLS,LR    Unit division 00    Status of Unit ISSUED,FINAL    Transfusion Status OK TO TRANSFUSE    Crossmatch Result      Compatible Performed at [facility], [address] [place], [place] [address]   Glucose, capillary     Status: Abnormal   Collection Time: 12/17/20  1:08 AM  Result Value Ref Range   Glucose-Capillary 211 (H) 70 - 99 mg/dL    Comment: Glucose reference range  applies only to samples taken after fasting for at least 8 hours.  Glucose, capillary     Status: Abnormal   Collection Time: 12/17/20  2:21 AM  Result Value Ref Range   Glucose-Capillary 184 (H) 70 - 99 mg/dL    Comment: Glucose reference range applies only to samples taken after fasting for at least 8 hours.  Glucose, capillary     Status: Abnormal   Collection Time: 12/17/20  3:28 AM  Result Value Ref Range   Glucose-Capillary 165 (H) 70 - 99 mg/dL    Comment: Glucose reference range applies only to samples taken after fasting for at least 8 hours.  Glucose, capillary     Status: Abnormal   Collection Time: 12/17/20  4:24 AM  Result Value Ref Range   Glucose-Capillary 141 (H) 70 - 99 mg/dL    Comment: Glucose reference range applies only to samples taken after fasting for at least 8 hours.  Glucose, capillary     Status:  Abnormal   Collection Time: 12/17/20  5:31 AM  Result Value Ref Range   Glucose-Capillary 145 (H) 70 - 99 mg/dL    Comment: Glucose reference range applies only to samples taken after fasting for at least 8 hours.  Glucose, capillary     Status: Abnormal   Collection Time: 12/17/20  6:33 AM  Result Value Ref Range   Glucose-Capillary 124 (H) 70 - 99 mg/dL    Comment: Glucose reference range applies only to samples taken after fasting for at least 8 hours.  Glucose, capillary     Status: Abnormal   Collection Time: 12/17/20  7:24 AM  Result Value Ref Range   Glucose-Capillary 123 (H) 70 - 99 mg/dL    Comment: Glucose reference range applies only to samples taken after fasting for at least 8 hours.  Glucose, capillary     Status: Abnormal   Collection Time: 12/17/20  8:37 AM  Result Value Ref Range   Glucose-Capillary 124 (H) 70 - 99 mg/dL    Comment: Glucose reference range applies only to samples taken after fasting for at least 8 hours.  Glucose, capillary     Status: None   Collection Time: 12/17/20 10:35 AM  Result Value Ref Range   Glucose-Capillary 75 70 - 99 mg/dL    Comment: Glucose reference range applies only to samples taken after fasting for at least 8 hours.  Glucose, capillary     Status: None   Collection Time: 12/17/20 10:58 AM  Result Value Ref Range   Glucose-Capillary 79 70 - 99 mg/dL    Comment: Glucose reference range applies only to samples taken after fasting for at least 8 hours.  Glucose, capillary     Status: Abnormal   Collection Time: 12/17/20  1:06 PM  Result Value Ref Range   Glucose-Capillary 158 (H) 70 - 99 mg/dL    Comment: Glucose reference range applies only to samples taken after fasting for at least 8 hours.  Glucose, capillary     Status: Abnormal   Collection Time: 12/17/20  2:14 PM  Result Value Ref Range   Glucose-Capillary 191 (H) 70 - 99 mg/dL    Comment: Glucose reference range applies only to samples taken after fasting for at least 8  hours.  Glucose, capillary     Status: Abnormal   Collection Time: 12/17/20  3:13 PM  Result Value Ref Range   Glucose-Capillary 196 (H) 70 - 99 mg/dL    Comment: Glucose reference range applies only  to samples taken after fasting for at least 8 hours.  CBC     Status: Abnormal   Collection Time: 12/17/20  4:00 PM  Result Value Ref Range   WBC 5.7 4.0 - 10.5 K/uL   RBC 2.35 (L) 3.87 - 5.11 MIL/uL   Hemoglobin 7.0 (L) 12.0 - 15.0 g/dL    Comment: REPEATED TO VERIFY   HCT 21.2 (L) 36.0 - 46.0 %   MCV 90.2 80.0 - 100.0 fL   MCH 29.8 26.0 - 34.0 pg   MCHC 33.0 30.0 - 36.0 g/dL   RDW 72.0 94.7 - 09.6 %   Platelets 198 150 - 400 K/uL   nRBC 0.0 0.0 - 0.2 %    Comment: Performed at [facility], [address], [place], [place] [address]  Basic metabolic panel     Status: Abnormal   Collection Time: 12/17/20  4:00 PM  Result Value Ref Range   Sodium 137 135 - 145 mmol/L   Potassium 3.5 3.5 - 5.1 mmol/L   Chloride 104 98 - 111 mmol/L   CO2 28 22 - 32 mmol/L   Glucose, Bld 167 (H) 70 - 99 mg/dL    Comment: Glucose reference range applies only to samples taken after fasting for at least 8 hours.   BUN 8 6 - 20 mg/dL   Creatinine, Ser 2.94 0.44 - 1.00 mg/dL   Calcium 8.1 (L) 8.9 - 10.3 mg/dL   GFR, Estimated >76 >54 mL/min    Comment: (NOTE) Calculated using the CKD-EPI Creatinine Equation (2021)    Anion gap 5 5 - 15    Comment: Performed at [facility], [address] [place], [place] [address]  Beta-hydroxybutyric acid     Status: None   Collection Time: 12/17/20  4:00 PM  Result Value Ref Range   Beta-Hydroxybutyric Acid 0.08 0.05 - 0.27 mmol/L    Comment: Performed at [facility], [address] [place], [place] [address]  Glucose, capillary     Status: Abnormal   Collection Time: 12/17/20  4:17 PM  Result Value Ref Range   Glucose-Capillary 142 (H) 70 - 99 mg/dL    Comment: Glucose reference range applies only to samples taken after fasting for at  least 8 hours.  Glucose, capillary     Status: Abnormal   Collection Time: 12/17/20  5:26 PM  Result Value Ref Range   Glucose-Capillary 173 (H) 70 - 99 mg/dL    Comment: Glucose reference range applies only to samples taken after fasting for at least 8 hours.  Glucose, capillary     Status: Abnormal   Collection Time: 12/17/20  6:16 PM  Result Value Ref Range   Glucose-Capillary 141 (H) 70 - 99 mg/dL    Comment: Glucose reference range applies only to samples taken after fasting for at least 8 hours.  Glucose, capillary     Status: Abnormal   Collection Time: 12/17/20  7:14 PM  Result Value Ref Range   Glucose-Capillary 144 (H) 70 - 99 mg/dL    Comment: Glucose reference range applies only to samples taken after fasting for at least 8 hours.  Glucose, capillary     Status: Abnormal   Collection Time: 12/17/20  8:20 PM  Result Value Ref Range   Glucose-Capillary 160 (H) 70 - 99 mg/dL    Comment: Glucose reference range applies only to samples taken after fasting for at least 8 hours.  Prepare RBC (crossmatch)  Status: None   Collection Time: 12/17/20  8:47 PM  Result Value Ref Range   Order Confirmation      ORDER PROCESSED BY BLOOD BANK Performed at [facility], [address], [place], [place] [address]   Glucose, capillary     Status: Abnormal   Collection Time: 12/17/20  9:23 PM  Result Value Ref Range   Glucose-Capillary 160 (H) 70 - 99 mg/dL    Comment: Glucose reference range applies only to samples taken after fasting for at least 8 hours.  Glucose, capillary     Status: Abnormal   Collection Time: 12/17/20 10:29 PM  Result Value Ref Range   Glucose-Capillary 148 (H) 70 - 99 mg/dL    Comment: Glucose reference range applies only to samples taken after fasting for at least 8 hours.  Glucose, capillary     Status: Abnormal   Collection Time: 12/17/20 11:36 PM  Result Value Ref Range   Glucose-Capillary 131 (H) 70 - 99 mg/dL    Comment: Glucose reference range  applies only to samples taken after fasting for at least 8 hours.  Glucose, capillary     Status: Abnormal   Collection Time: 12/18/20 12:30 AM  Result Value Ref Range   Glucose-Capillary 138 (H) 70 - 99 mg/dL    Comment: Glucose reference range applies only to samples taken after fasting for at least 8 hours.  Glucose, capillary     Status: Abnormal   Collection Time: 12/18/20  1:40 AM  Result Value Ref Range   Glucose-Capillary 127 (H) 70 - 99 mg/dL    Comment: Glucose reference range applies only to samples taken after fasting for at least 8 hours.  Glucose, capillary     Status: Abnormal   Collection Time: 12/18/20  2:30 AM  Result Value Ref Range   Glucose-Capillary 131 (H) 70 - 99 mg/dL    Comment: Glucose reference range applies only to samples taken after fasting for at least 8 hours.  Comprehensive metabolic panel     Status: Abnormal   Collection Time: 12/18/20  4:06 AM  Result Value Ref Range   Sodium 137 135 - 145 mmol/L   Potassium 3.6 3.5 - 5.1 mmol/L   Chloride 99 98 - 111 mmol/L   CO2 30 22 - 32 mmol/L   Glucose, Bld 122 (H) 70 - 99 mg/dL    Comment: Glucose reference range applies only to samples taken after fasting for at least 8 hours.   BUN 13 6 - 20 mg/dL   Creatinine, Ser 6.04 (H) 0.44 - 1.00 mg/dL   Calcium 8.4 (L) 8.9 - 10.3 mg/dL   Total Protein 5.0 (L) 6.5 - 8.1 g/dL   Albumin 2.7 (L) 3.5 - 5.0 g/dL   AST 15 15 - 41 U/L   ALT 8 0 - 44 U/L   Alkaline Phosphatase 67 38 - 126 U/L   Total Bilirubin 1.4 (H) 0.3 - 1.2 mg/dL   GFR, Estimated >54 >09 mL/min    Comment: (NOTE) Calculated using the CKD-EPI Creatinine Equation (2021)    Anion gap 8 5 - 15    Comment: Performed at [facility], [address] [place], [place] [address]  Magnesium     Status: None   Collection Time: 12/18/20  4:06 AM  Result Value Ref Range   Magnesium 2.0 1.7 - 2.4 mg/dL    Comment: Performed at [facility], [address], [place], [place] [address]   Phosphorus  Status: None   Collection Time: 12/18/20  4:06 AM  Result Value Ref Range   Phosphorus 4.2 2.5 - 4.6 mg/dL    Comment: Performed at [facility], [address] [place], [place] [address]  Beta-hydroxybutyric acid     Status: Abnormal   Collection Time: 12/18/20  4:06 AM  Result Value Ref Range   Beta-Hydroxybutyric Acid <0.05 (L) 0.05 - 0.27 mmol/L    Comment: REPEATED TO VERIFY Performed at [facility], [address], [place], [place] [address]   CBC     Status: Abnormal   Collection Time: 12/18/20  4:06 AM  Result Value Ref Range   WBC 5.1 4.0 - 10.5 K/uL   RBC 2.82 (L) 3.87 - 5.11 MIL/uL   Hemoglobin 8.1 (L) 12.0 - 15.0 g/dL   HCT 96.7 (L) 89.3 - 81.0 %   MCV 88.3 80.0 - 100.0 fL   MCH 28.7 26.0 - 34.0 pg   MCHC 32.5 30.0 - 36.0 g/dL   RDW 17.5 10.2 - 58.5 %   Platelets 186 150 - 400 K/uL   nRBC 0.0 0.0 - 0.2 %    Comment: Performed at [facility], [address], [place], [place] [address]  Glucose, capillary     Status: Abnormal   Collection Time: 12/18/20  4:31 AM  Result Value Ref Range   Glucose-Capillary 121 (H) 70 - 99 mg/dL    Comment: Glucose reference range applies only to samples taken after fasting for at least 8 hours.  Glucose, capillary     Status: Abnormal   Collection Time: 12/18/20  6:30 AM  Result Value Ref Range   Glucose-Capillary 168 (H) 70 - 99 mg/dL    Comment: Glucose reference range applies only to samples taken after fasting for at least 8 hours.  Glucose, capillary     Status: Abnormal   Collection Time: 12/18/20  7:28 AM  Result Value Ref Range   Glucose-Capillary 145 (H) 70 - 99 mg/dL    Comment: Glucose reference range applies only to samples taken after fasting for at least 8 hours.  Glucose, capillary     Status: Abnormal   Collection Time: 12/18/20  8:39 AM  Result Value Ref Range   Glucose-Capillary 142 (H) 70 - 99 mg/dL    Comment: Glucose reference range applies only to samples taken after  fasting for at least 8 hours.  Glucose, capillary     Status: Abnormal   Collection Time: 12/18/20  9:34 AM  Result Value Ref Range   Glucose-Capillary 146 (H) 70 - 99 mg/dL    Comment: Glucose reference range applies only to samples taken after fasting for at least 8 hours.  Glucose, capillary     Status: Abnormal   Collection Time: 12/18/20 10:29 AM  Result Value Ref Range   Glucose-Capillary 106 (H) 70 - 99 mg/dL    Comment: Glucose reference range applies only to samples taken after fasting for at least 8 hours.  Glucose, capillary     Status: Abnormal   Collection Time: 12/18/20 11:43 AM  Result Value Ref Range   Glucose-Capillary 127 (H) 70 - 99 mg/dL    Comment: Glucose reference range applies only to samples taken after fasting for at least 8 hours.  Glucose, capillary     Status: Abnormal   Collection Time: 12/18/20 12:42 PM  Result Value Ref Range   Glucose-Capillary 157 (H) 70 - 99 mg/dL    Comment: Glucose reference range  applies only to samples taken after fasting for at least 8 hours.  Glucose, capillary     Status: Abnormal   Collection Time: 12/18/20  1:42 PM  Result Value Ref Range   Glucose-Capillary 156 (H) 70 - 99 mg/dL    Comment: Glucose reference range applies only to samples taken after fasting for at least 8 hours.    I have reviewed the patient's current medications.  ASSESSMENT: Active Problems:   Anemia affecting pregnancy in second trimester   AKI (acute kidney injury) (HCC)   Hyperemesis   Pregnancy with 24 completed weeks gestation   DKA, type 1, not at goal [facility](HCC)   Starvation ketoacidosis   Acute deep vein thrombosis (DVT) of brachial vein of right upper extremity (HCC)   Acute respiratory failure with hypoxia (HCC)   Vitreous hemorrhage of right eye (HCC)   Gastroparesis due to DM (HCC)   Diabetic retinopathy (HCC)   Acute on chronic combined systolic and diastolic CHF (congestive heart failure) (HCC)   PLAN: From an OB standpoint the  patient [facility] for closer obstetric monitoring.  MFM has also seen the patient.   Currently researching IM glucocorticoid protocol to aid in treating hyperemesis.   Scopolamine patch added. Pt currently is on telemetry  and QT interval can be monitored Medicine and diabetes coordinator to manage type 1 DM with endotool/insulin drip, especially once steroids are initiated. Medicine will monitor other chronic illnesses. FHT q shift with daily NST   Continue routine antenatal care.   Mariel AloeLawrence Arbie Reisz, MD [facility] Faculty Attending, [facility] 12/18/2020 2:45 PM

## 2020-12-18 NOTE — Progress Notes (Signed)
Nutrition Follow-up  DOCUMENTATION CODES:   Not applicable  INTERVENTION:   -TPN management per pharmacy (100 mg thiamine daily has been added to TPN) -Continue Osmolite 1.5 @ 30 ml/hr via cortrak tube and increase by 10 ml every 12 hours to goal rate of 50 ml/hr.   45 ml Prosource TF BID.     Tube feeding regimen provides 1880 kcal (100% of needs), 97 grams of protein, and 914 ml of H2O.    NUTRITION DIAGNOSIS:   Inadequate oral intake related to nausea, vomiting as evidenced by NPO status.  Ongoing  GOAL:   Patient will meet greater than or equal to 90% of their needs  Met with TPN  MONITOR:   Diet advancement, Labs, Weight trends, TF tolerance, Skin, I & O's  REASON FOR ASSESSMENT:   Consult Assessment of nutrition requirement/status, New TPN/TNA  ASSESSMENT:   30 y.o. female with medical history significant of 37w5don presentation, Estimated Date of Delivery: 03/30/21 by by 9 week ultrasound, Type 1 diabetic, Cardiomyopathy, chronic systolic  71/69 cortrak placed- post-pyloric placement  Reviewed I/O's: -2.3 L x 24 hours and -4.4 L since admission  UOP: 3 L x 24 hours  Pt receiving nursing care at time of visit.   Case discussed with cardiology, who reports pt vomited prior to RD visit. She is currently receiving Osmolite 1.5 @ 30 ml/hr. Verified that thiamine has been added to TPN.   Pt remains on TPN at 65 ml/hr, which porvides 1966 kcals and 95 grams protein, meeting 100% of estimated kcal and protein needs.  Lab Results  Component Value Date   HGBA1C 5.8 (H) 11/21/2020   PTA DM medications are .   Labs reviewed: CBGS: 115-157 (inpatient orders for glycemic control are IV insulin drip).    Diet Order:   Diet Order     None       EDUCATION NEEDS:   No education needs have been identified at this time  Skin:  Skin Assessment: Reviewed RN Assessment  Last BM:  12/18/20  Height:   Ht Readings from Last 1 Encounters:  12/17/20 5' 1"  (1.549 m)    Weight:   Wt Readings from Last 1 Encounters:  12/18/20 72.9 kg    Ideal Body Weight:  47.7 kg  BMI:  Body mass index is 30.37 kg/m.  Estimated Nutritional Needs:   Kcal:  1700-1900  Protein:  90-105 grams  Fluid:  > 1.7 L    JLoistine Chance RD, LDN, [place]reasure IslandRegistered Dietitian II Certified Diabetes Care and Education Specialist Please refer to [facility]orth [facility] Hospitalfor RD and/or RD on-call/weekend/after hours pager

## 2020-12-18 NOTE — Consult Note (Addendum)
PHARMACY - TOTAL PARENTERAL NUTRITION CONSULT NOTE   Indication:  inability to tolerate post-pyloric dobhoff and signs of starvation ISO pregnancy  Patient Measurements: Body mass index is 30.37 kg/m. Usual Weight: 57kg   Assessment: 30 year old female at 73-[redacted] weeks gestation with a h/o type 1 diabetes. Patient with hyperemesis gravidarum. 1 month or greater of poor po intake. Patient did not tolerate NGT at [facility] and currently does not want to attempt post-pyloric dobhoff placement. She was started on TPN 7/25 for hyperemesis gravidarum, severe PCM from poor po intake.  - PMH: DM type 1, gastroparesis, HTN, CHF, h/o DVT, anemia, recently treated at [facility] for sepsis secondary to pneumonia and was there for 2 weeks.  Glucose / Insulin: Type 1 DM. A1c 5.8. CBGs mostly 120s-140s (max 191) on insulin infusion (Endotool goal should be 90-120 in pregnancy)  Electrolytes: Starvation ketoacidosis: K 3.6, CoCa 9.4, Mg 1.4>2 today, Phos 4.2 WNL. Monitor for refeeding. - 7/27: FA 19.8 WNL, B12 1146 ok  Renal: Scr 1, good UOP  Hepatic: Severely low albumin (1.5-1.8) on admit. AST/ALT WNL; Tbili 3.3>1>1.4, prealbumin only 5.8. - Receiving Albumin 25g BID  Intake / Output; MIVF: UOP , +BMs, Emesis none documented for 7/27. - Pt with HF (EF 40-45%) and anasarca from hypoalbuminemia with 3rd spacing of fluids.--IV Lasix. I/O -4.3L  GI: Albumin 25g BID scheduled, Pepcid>>IV PPI q12 hrs.  GI Imaging: N/A  GI Surgeries / Procedures:  7/27: Cortrak placed  Central access: PICC (7/24)>>7/27 Triple lumen PICC replaced TPN start date: 7/25   Nutritional Goals (per RD recommendation on 7/26): kCal: 1900-2200 Protein: 85-95g of protein, Fluid: 1.4-1.7 L/day (has HF)  -Goal TPN rate is 65 mL/hr (provides 95 g of protein and 1966 kcals per day)  - Initiate Osmolite 1.5 @ 20 ml/hr via cortrak tube and increase by 10 ml every 12 hours to goal rate of 50 ml/hr. 45 ml Prosource TF  BID.   Tube feeding regimen provides 1880 kcal (100% of needs), 97 grams of protein, and 914 ml of H2O.  Current Nutrition:  - TPN - Prosource 87ml BID + Osmolite currently at 33ml/hr (goal 61ml/hr increasing by 66ml/hr every 12 hrs)   Plan:  Tube feeds up to 47ml/hr this AM-increasing by 41ml/hr every 12 hrs  TPN at goal 65 mL/hr at 1800 to provide 95g protein 57.8g lipids (29%), 296g dextrose (GIR 3.6), and 1966 total kcal. - will not wean TPN yet today since she has not been on tube feeds but 12 hours. Make sure she continues to tolerate with no additional vomiting.  Electrolytes in TPN: Na 44mEq/L, K 76mEq/L, Ca 12mEq/L, Mg 21mEq/L, and Phos 60mmol/L. Cl:Ac 1:1 - Watch K+ and Mg closely on IV Lasix and also with refeeding  Add standard MVI and trace elements to TPN (not taking prenatal vit)  Add folic acid and thiamine to TPN>>change to per tube on 7/29 if con't to tolerate tube feeds.  Continue insulin drip. Transition to basal insulin regimen when off TPN.  Monitor TPN labs on Mon/Thurs, and PRN   Adden: 1044 was notified to HOLD the TPN for today. Will reassess tomorrow and resume if needed.   Cathren Sween S. Merilynn Finland, PharmD, BCPS Clinical Staff Pharmacist [email or web address] Pasty Spillers, PharmD, BCPS 12/18/2020,7:43 AM

## 2020-12-18 NOTE — Progress Notes (Signed)
PT Cancellation Note  Patient Details Name: Ashley Camacho MRN: [id number] DOB: 1990-09-23   Cancelled Treatment:    Reason Eval/Treat Not Completed: Medical issues which prohibited therapy.  Per RN, pt just got back in the bed, was able to be up in the recliner for ~2 hours.  Pt was actively vomiting when I came in, so I just gathered missing history/PLOF information and helped pt get more comfortable.  PT will check back tomorrow to encourage OOB mobility.   Thanks,  Corinna Capra, PT, DPT  Acute Rehabilitation Ortho Tech Supervisor [phone] pager #[phone] office      Rollene Rotunda Laquesha Holcomb 12/18/2020, 2:18 PM

## 2020-12-18 NOTE — Progress Notes (Signed)
ANTICOAGULATION CONSULT NOTE - Follow Up Consult  Pharmacy Consult for Lovenox Indication: VTE treatment,  right brachial DVT  No Known Allergies  Patient Measurements: Height: 5\' 1"  (154.9 cm) Weight: 72.9 kg (160 lb 11.5 oz) IBW/kg (Calculated) : 47.8 Lovenox Dosing Weight: 70.2 kg  Vital Signs: Temp: 97.9 F (36.6 C) (07/28 0732) Temp Source: Oral (07/28 0732) BP: 146/60 (07/28 0732) Pulse Rate: 104 (07/28 0732)  Labs: Recent Labs    12/17/20 0049 12/17/20 1600 12/18/20 0406  HGB 7.5* 7.0* 8.1*  HCT 22.3* 21.2* 24.9*  PLT 177 198 186  CREATININE 1.03* 0.49 1.06*  CKTOTAL 54  55  --   --      Estimated Creatinine Clearance: 71.5 mL/min (A) (by C-G formula based on SCr of 1.06 mg/dL (H)).  Assessment:  30 yr old female with recent right brachial DVT.  Was on Lovenox 80 mg SQ q12h at [facility] and initially continued on transfer to [facility] 7/26 pm with last dose at 2327 on 7/26.  Lovenox dose was adjusted to 60 mg Q12H, but turns out the patient's weight was drastically different than weights reported from [facility]. RN assessed weight 7/28 and patient weighed 70.2 kg, requiring dose adjustment (1 mg/kg Q12H). Hgb decreased to 7 7/27 and patient received 2U PRBCs. Now H/H 8.1/24.9, PLT WNL. Per RN, no signs or symptoms of bleeding including blood in stool, vomit, urine.   Goal of Therapy:  Monitor platelets by anticoagulation protocol: Yes   Plan:  Increase Lovenox to 70 mg SQ q12h Monitor for any signs/symptoms of bleeding. Daily CBC.  [birth year]-18-1988, PharmD PGY-1 Acute Care Resident  12/18/2020 10:48 AM

## 2020-12-18 NOTE — Progress Notes (Signed)
PROGRESS NOTE    Ashley Camacho  OEV:[id number] DOB: 1991-01-20 DOA: 12/16/2020 PCP: Hildred Laser, MD  [phone]   Assessment & Plan:   Active Problems:   Anemia affecting pregnancy in second trimester   AKI (acute kidney injury) (HCC)   Hyperemesis   Pregnancy with 24 completed weeks gestation   DKA, type 1, not at goal [facility])   Starvation ketoacidosis   Acute deep vein thrombosis (DVT) of brachial vein of right upper extremity (HCC)   Acute respiratory failure with hypoxia (HCC)   Vitreous hemorrhage of right eye (HCC)   Gastroparesis due to DM (HCC)   Diabetic retinopathy (HCC)   Acute on chronic combined systolic and diastolic CHF (congestive heart failure) (HCC)   Patient is a 30 year old female G2,P1 at [redacted] weeks gestation who has a history of type 1 diabetes mellitus.  She presented to the emergency room for evaluation of persistent nausea and vomiting .  She was recently with multiple hospitalization, at [facility] for sepsis secondary to pneumonia and was there for 2 weeks.  Couple hospitalization at [facility] due to intractable nausea and vomiting. -Transferred from [facility] to [facility] 7/26 given her severe malnutrition, with hyperemesis gravidarum, for consideration of cortrak tube insertion.   Severe protein calorie malnutrition/hyperemesis gravidarum -Patient unable to have an oral intake for some time now, she is started on TPN, core track tube was inserted 7/27, she is started on tube feed managed by nutritionist,. -She remains with significant nausea and vomiting, but this is at her baseline over her last few weeks, likely her tube feed is contributing to it. -No known history of gastroparesis, but she is with known history of brittle diabetes, her A1c was 5.8 11/21/2020 -Added Reglan, and steroids per hyperemesis gravidarum protocol as discussed with OB service. -Hold TPN today and monitor if she is still tolerating her intake via tube feed. -We will  monitor closely for refeeding syndrome.  Phosphorus, magnesium, BMP daily. -Continue with TPN till her tube feed intake is reliable -She is to receive some albumin -Continue Remeron  Type 1 diabetes mellitus insulin-dependent, uncontrolled with hypoglycemia. -Given patient oral intake is unreliable, and her CBG level fluctuating significantly, she will be kept on insulin drip for now.  Hyperemesis gravidarum -See above discussion  Cardiomyopathy/chronic systolic heart failure 2D echocardiogram shows an LVEF of 45% -With lower extremity edema, likely combination of both CHF exacerbation and third spacing from hypoalbuminemia -Giving IV albumin -Continue with IV diuresis - cardiology  input greatly appreciated, currently on hydralazine for blood pressure control -Started on IV thiamine   History of right brachial DVT -Continue with full dose anticoagulation Lovenox   Hypomagnesemia -Repleted  Normocytic anemia -In the setting of pregnancy, she was transfused 1 unit PRBC on 7/27.  Patient admitted with Foley catheter, which was discontinued 7/27   DVT prophylaxis: LF:[id number] dose lovenox Code Status: Full code  Family Communication: none at bedside Level of care: Antepartum Dispo:   The patient is from: transfer from [facility]  Remains inpatient appropriate because:Inpatient level of care appropriate due to severity of illness   Dispo: The patient is from: [facility]              Anticipated d/c is to: Home              Patient currently is not medically stable to d/c.              Difficult to place patient No   Subjective :  Continues to have nausea, vomiting, he denies any chest pain or shortness of breath.   Objective: Vitals:   12/18/20 0422 12/18/20 0611 12/18/20 0732 12/18/20 1218  BP:  (!) 142/65 (!) 146/60 125/61  Pulse:   (!) 104 (!) 109  Resp:   18 19  Temp:   97.9 F (36.6 C) 98 F (36.7 C)  TempSrc:   Oral Axillary  SpO2:   98% 96%  Weight: 72.9 kg      Height:        Intake/Output Summary (Last 24 hours) at 12/18/2020 1454 Last data filed at 12/18/2020 0453 Gross per 24 hour  Intake 711.08 ml  Output 2300 ml  Net -1588.92 ml   Filed Weights   12/17/20 1600 12/18/20 0422  Weight: 57 kg 72.9 kg     Examination:   Awake Alert, Oriented X 3, currently ill-appearing, flat affect  Symmetrical Chest wall movement, Good air movement bilaterally, CTAB RRR,No Gallops,Rubs or new Murmurs, No Parasternal Heave +ve B.Sounds, Abd Soft, No tenderness, No rebound - guarding or rigidity. No Cyanosis, Clubbing, mild edema edema, No new Rash or bruise       Data Reviewed: I have personally reviewed following labs and imaging studies  CBC: Recent Labs  Lab 12/12/20 2111 12/13/20 0811 12/15/20 0536 12/17/20 0049 12/17/20 1600 12/18/20 0406  WBC 7.2 8.8 7.5 6.1 5.7 5.1  NEUTROABS 4.9 8.1* 5.5 4.3  --   --   HGB 9.9* 9.5* 8.6* 7.5* 7.0* 8.1*  HCT 28.9* 27.8* 25.4* 22.3* 21.2* 24.9*  MCV 89.5 90.0 90.1 89.9 90.2 88.3  PLT 171 177 168 177 198 186   Basic Metabolic Panel: Recent Labs  Lab 12/15/20 0536 12/15/20 2328 12/16/20 0430 12/17/20 0049 12/17/20 1600 12/18/20 0406  NA 139  --  139 138 137 137  K 3.4* 3.5 3.5 3.6 3.5 3.6  CL 113*  --  109 105 104 99  CO2 22  --  25 25 28 30   GLUCOSE 176*  --  143* 207* 167* 122*  BUN <5*  --  <5* <5* 8 13  CREATININE 1.06*  --  1.09* 1.03* 0.49 1.06*  CALCIUM 7.4*  --  7.6* 7.9* 8.1* 8.4*  MG 1.4* 1.7 1.7 1.4*  --  2.0  PHOS 1.9*  --  3.2 3.7  --  4.2   GFR: Estimated Creatinine Clearance: 71.5 mL/min (A) (by C-G formula based on SCr of 1.06 mg/dL (H)). Liver Function Tests: Recent Labs  Lab 12/13/20 0811 12/15/20 0536 12/17/20 0049 12/18/20 0406  AST 32 25 16 15   ALT 15 12 10 8   ALKPHOS 102 78 72 67  BILITOT 3.3* 1.4* 1.0 1.4*  PROT 5.2* 4.2* 4.9* 5.0*  ALBUMIN 1.8* 1.5* 2.6* 2.7*   No results for input(s): LIPASE, AMYLASE in the last 168 hours. No results for  input(s): AMMONIA in the last 168 hours. Coagulation Profile: No results for input(s): INR, PROTIME in the last 168 hours. Cardiac Enzymes: Recent Labs  Lab 12/17/20 0049  CKTOTAL 54  55   BNP (last 3 results) No results for input(s): PROBNP in the last 8760 hours. HbA1C: No results for input(s): HGBA1C in the last 72 hours. CBG: Recent Labs  Lab 12/18/20 1029 12/18/20 1143 12/18/20 1242 12/18/20 1342 12/18/20 1450  GLUCAP 106* 127* 157* 156* 131*   Lipid Profile: No results for input(s): CHOL, HDL, LDLCALC, TRIG, CHOLHDL, LDLDIRECT in the last 72 hours.  Thyroid Function Tests: Recent Labs    12/17/20  0049  TSH 3.907   Anemia Panel: Recent Labs    12/17/20 0049  VITAMINB12 1,146*  FOLATE 19.8  FERRITIN 681*  TIBC 125*  IRON 50  RETICCTPCT 1.9   Sepsis Labs: No results for input(s): PROCALCITON, LATICACIDVEN in the last 168 hours.  Recent Results (from the past 240 hour(s))  Resp Panel by RT-PCR (Flu A&B, Covid) Nasopharyngeal Swab     Status: None   Collection Time: 12/13/20  1:48 AM   Specimen: Nasopharyngeal Swab; Nasopharyngeal(NP) swabs in vial transport medium  Result Value Ref Range Status   SARS Coronavirus 2 by RT PCR NEGATIVE NEGATIVE Final    Comment: (NOTE) SARS-CoV-2 target nucleic acids are NOT DETECTED.  The SARS-CoV-2 RNA is generally detectable in upper respiratory specimens during the acute phase of infection. The lowest concentration of SARS-CoV-2 viral copies this assay can detect is 138 copies/mL. A negative result does not preclude SARS-Cov-2 infection and should not be used as the sole basis for treatment or other patient management decisions. A negative result may occur with  improper specimen collection/handling, submission of specimen other than nasopharyngeal swab, presence of viral mutation(s) within the areas targeted by this assay, and inadequate number of viral copies(<138 copies/mL). A negative result must be combined  with clinical observations, patient history, and epidemiological information. The expected result is Negative.  Fact Sheet for Patients:  BloggerCourse.com  Fact Sheet for Healthcare Providers:  SeriousBroker.it  This test is no t yet approved or cleared by the [place] FDA and  has been authorized for detection and/or diagnosis of SARS-CoV-2 by FDA under an Emergency Use Authorization (EUA). This EUA will remain  in effect (meaning this test can be used) for the duration of the COVID-19 declaration under Section 564(b)(1) of the Act, 21 U.S.C.section 360bbb-3(b)(1), unless the authorization is terminated  or revoked sooner.       Influenza A by PCR NEGATIVE NEGATIVE Final   Influenza B by PCR NEGATIVE NEGATIVE Final    Comment: (NOTE) The Xpert Xpress SARS-CoV-2/FLU/RSV plus assay is intended as an aid in the diagnosis of influenza from Nasopharyngeal swab specimens and should not be used as a sole basis for treatment. Nasal washings and aspirates are unacceptable for Xpert Xpress SARS-CoV-2/FLU/RSV testing.  Fact Sheet for Patients: BloggerCourse.com  Fact Sheet for Healthcare Providers: SeriousBroker.it  This test is not yet approved or cleared by the [place] FDA and has been authorized for detection and/or diagnosis of SARS-CoV-2 by FDA under an Emergency Use Authorization (EUA). This EUA will remain in effect (meaning this test can be used) for the duration of the COVID-19 declaration under Section 564(b)(1) of the Act, 21 U.S.C. section 360bbb-3(b)(1), unless the authorization is terminated or revoked.  Performed at [facility], [address] [place], [place] [address]   MRSA Next Gen by PCR, Nasal     Status: None   Collection Time: 12/15/20  9:20 PM   Specimen: Nasal Mucosa; Nasal Swab  Result Value Ref Range Status   MRSA by PCR Next Gen  NOT DETECTED NOT DETECTED Final    Comment: (NOTE) The GeneXpert MRSA Assay (FDA approved for NASAL specimens only), is one component of a comprehensive MRSA colonization surveillance program. It is not intended to diagnose MRSA infection nor to guide or monitor treatment for MRSA infections. Test performance is not FDA approved in patients less than 12 years old. Performed at [facility], [address], [place], [place] [address]  Radiology Studies: DG CHEST PORT 1 VIEW  Result Date: 12/17/2020 CLINICAL DATA:  Radiograph 12/13/2020 EXAM: PORTABLE CHEST 1 VIEW COMPARISON:  CT 11/22/2020 FINDINGS: Perihilar and basilar predominant hazy and patchy opacities with pulmonary vascular congestion and cuffing most consistent with pulmonary edema. Layering bilateral pleural effusions including fluid tracking in the right fissures. No pneumothorax. Cardiac size is similar to comparison prior. Left upper extremity PICC tip terminates at the right atrium. Telemetry leads overlie the chest. Edematous changes seen in the chest wall soft tissues. No acute or worrisome osseous abnormality. IMPRESSION: Findings most compatible with pulmonary edema and developing bilateral effusions. Passive basilar atelectasis is likely present as well. Electronically Signed   By: Kreg Shropshire M.D.   On: 12/17/2020 01:59   DG Abd Portable 1V  Result Date: 12/17/2020 CLINICAL DATA:  Feeding tube placement EXAM: PORTABLE ABDOMEN - 1 VIEW COMPARISON:  None. FINDINGS: Feeding tube is transpyloric with the tip in the distal duodenum near the ligament of Treitz. IMPRESSION: Feeding tube tip in the distal duodenum. Electronically Signed   By: Charlett Nose M.D.   On: 12/17/2020 15:25   ECHOCARDIOGRAM COMPLETE  Result Date: 12/17/2020    ECHOCARDIOGRAM REPORT   Patient Name:   Ashley Camacho Date of Exam: 12/17/2020 Medical Rec #:  [id number]       Height:       61.0 in Accession #:    [id number]      Weight:        125.7 lb Date of Birth:  1990-06-30      BSA:          1.550 m Patient Age:    29 years        BP:           122/66 mmHg Patient Gender: F               HR:           103 bpm. Exam Location:  Inpatient Procedure: 2D Echo, 3D Echo, Color Doppler, Cardiac Doppler and Strain Analysis Indications:    I50.21 Acute systolic (congestive) heart failure  History:        Patient has prior history of Echocardiogram examinations, most                 recent 11/25/2020. Risk Factors:Hypertension and Diabetes. Prior                 performed at [facility].  Sonographer:    Irving Burton Senior RDCS Referring Phys: [id number] ANASTASSIA DOUTOVA  Sonographer Comments: [redacted] weeks pregnant at time of study. IMPRESSIONS  1. Left ventricular ejection fraction, by estimation, is 40 to 45%. The left ventricle has mildly decreased function. The left ventricle demonstrates global hypokinesis. Left ventricular diastolic parameters are indeterminate.  2. Right ventricular systolic function is normal. The right ventricular size is mildly enlarged. There is normal pulmonary artery systolic pressure. The estimated right ventricular systolic pressure is 29.2 mmHg.  3. Right atrial size was mildly dilated.  4. A small pericardial effusion is present.  5. The mitral valve is normal in structure. Mild to moderate mitral valve regurgitation.  6. The aortic valve is tricuspid. Aortic valve regurgitation is not visualized. No aortic stenosis is present.  7. The inferior vena cava is normal in size with greater than 50% respiratory variability, suggesting right atrial pressure of 3 mmHg. FINDINGS  Left Ventricle: Left ventricular ejection fraction, by estimation, is 40 to 45%. The left ventricle has mildly  decreased function. The left ventricle demonstrates global hypokinesis. The left ventricular internal cavity size was normal in size. There is  no left ventricular hypertrophy. Left ventricular diastolic parameters are indeterminate. Right Ventricle: The right ventricular  size is mildly enlarged. No increase in right ventricular wall thickness. Right ventricular systolic function is normal. There is normal pulmonary artery systolic pressure. The tricuspid regurgitant velocity is 2.56  m/s, and with an assumed right atrial pressure of 3 mmHg, the estimated right ventricular systolic pressure is 29.2 mmHg. Left Atrium: Left atrial size was normal in size. Right Atrium: Right atrial size was mildly dilated. Pericardium: A small pericardial effusion is present. Mitral Valve: The mitral valve is normal in structure. Mild to moderate mitral valve regurgitation. Tricuspid Valve: The tricuspid valve is normal in structure. Tricuspid valve regurgitation is mild. Aortic Valve: The aortic valve is tricuspid. Aortic valve regurgitation is not visualized. No aortic stenosis is present. Pulmonic Valve: The pulmonic valve was grossly normal. Pulmonic valve regurgitation is trivial. Aorta: The aortic root and ascending aorta are structurally normal, with no evidence of dilitation. Venous: The inferior vena cava is normal in size with greater than 50% respiratory variability, suggesting right atrial pressure of 3 mmHg. IAS/Shunts: The interatrial septum was not well visualized.  LEFT VENTRICLE PLAX 2D LVIDd:         4.10 cm  Diastology LVIDs:         3.50 cm  LV e' medial:    8.70 cm/s LV PW:         1.00 cm  LV E/e' medial:  16.7 LV IVS:        0.90 cm  LV e' lateral:   11.30 cm/s LVOT diam:     1.60 cm  LV E/e' lateral: 12.8 LV SV:         50 LV SV Index:   33 LVOT Area:     2.01 cm  RIGHT VENTRICLE RV S prime:     10.60 cm/s TAPSE (M-mode): 1.6 cm LEFT ATRIUM             Index       RIGHT ATRIUM           Index LA diam:        4.00 cm 2.58 cm/m  RA Area:     17.40 cm LA Vol (A2C):   48.2 ml 31.09 ml/m RA Volume:   49.00 ml  31.61 ml/m LA Vol (A4C):   35.2 ml 22.71 ml/m LA Biplane Vol: 44.4 ml 28.64 ml/m  AORTIC VALVE LVOT Vmax:   123.00 cm/s LVOT Vmean:  93.400 cm/s LVOT VTI:    0.251 m   AORTA Ao Root diam: 2.50 cm Ao Asc diam:  2.70 cm MITRAL VALVE                TRICUSPID VALVE MV Area (PHT): 4.80 cm     TR Peak grad:   26.2 mmHg MV Decel Time: 158 msec     TR Vmax:        256.00 cm/s MV E velocity: 145.00 cm/s MV A velocity: 45.80 cm/s   SHUNTS MV E/A ratio:  3.17         Systemic VTI:  0.25 m                             Systemic Diam: 1.60 cm Epifanio Lescheshristopher Schumann MD Electronically signed by Epifanio Lescheshristopher Schumann MD Signature Date/Time:  12/17/2020/10:41:24 AM    Final    [place] EKG SITE RITE  Result Date: 12/17/2020 If Site Rite image not attached, placement could not be confirmed due to current cardiac rhythm.    Scheduled Meds:  Chlorhexidine Gluconate Cloth  6 each Topical Daily   enoxaparin  70 mg Subcutaneous Q12H   feeding supplement (PROSource TF)  45 mL Per Tube BID   ferrous sulfate  300 mg Per Tube Q breakfast   furosemide  40 mg Intravenous Daily   hydrALAZINE  10 mg Per Tube Q8H   [START ON 12/19/2020] methylPREDNISolone  16 mg Per Tube Q breakfast   Followed by   Melene Muller ON 12/23/2020] methylPREDNISolone  8 mg Per Tube Q breakfast   Followed by   Melene Muller ON 12/30/2020] methylPREDNISolone  4 mg Per Tube Q breakfast   [START ON 12/19/2020] methylPREDNISolone  16 mg Per Tube Q1400   Followed by   Melene Muller ON 12/21/2020] methylPREDNISolone  8 mg Per Tube Q1400   Followed by   Melene Muller ON 12/24/2020] methylPREDNISolone  4 mg Per Tube Q1400   [START ON 12/19/2020] methylPREDNISolone  16 mg Per Tube QHS   Followed by   Melene Muller ON 12/22/2020] methylPREDNISolone  8 mg Per Tube QHS   Followed by   Melene Muller ON 12/25/2020] methylPREDNISolone  4 mg Per Tube QHS   methylPREDNISolone (SOLU-MEDROL) injection  48 mg Intravenous Once   metoCLOPramide (REGLAN) injection  5 mg Intravenous Q6H   mirtazapine  15 mg Per Tube QHS   ondansetron  4 mg Intravenous Q8H   pantoprazole (PROTONIX) IV  40 mg Intravenous Q12H   prenatal multivitamin  1 tablet Per Tube Q1200   scopolamine  1 patch Transdermal  Q72H   sodium chloride flush  10-40 mL Intracatheter Q12H   sodium chloride flush  3 mL Intravenous Q12H   Continuous Infusions:  sodium chloride     albumin human 25 g (12/18/20 0811)   feeding supplement (OSMOLITE 1.5 CAL) 30 mL/hr at 12/18/20 0453   insulin 1.5 Units/hr (12/18/20 1451)   promethazine (PHENERGAN)25mg  in 50mL NS 25 mg (12/18/20 0306)   thiamine injection     TPN ADULT (ION) 65 mL/hr at 12/17/20 1855     LOS: 2 days     Huey Bienenstock, MD [facility] If 7PM-7AM, please contact night-coverage 12/18/2020, 2:54 PM

## 2020-12-18 NOTE — Progress Notes (Addendum)
Progress Note  Patient Name: Ashley Camacho Date of Encounter: 12/18/2020  [facility] Cardiologist: New to Dr. Royann Shivers  Subjective   Patient states she feel little better with SOB today, still swollen every where. She is actively vomiting during encounter frequently, she felt her feeding tube maybe making this worse. She denied chest pain.   Inpatient Medications    Scheduled Meds:  Chlorhexidine Gluconate Cloth  6 each Topical Daily   enoxaparin  70 mg Subcutaneous Q12H   feeding supplement (PROSource TF)  45 mL Per Tube BID   ferrous sulfate  324 mg Oral Q breakfast   furosemide  40 mg Intravenous Daily   hydrALAZINE  10 mg Per Tube Q8H   mirtazapine  15 mg Oral QHS   ondansetron  4 mg Intravenous Q8H   pantoprazole (PROTONIX) IV  40 mg Intravenous Q12H   prenatal multivitamin  1 tablet Oral Q1200   sodium chloride flush  10-40 mL Intracatheter Q12H   sodium chloride flush  3 mL Intravenous Q12H   Continuous Infusions:  sodium chloride     albumin human 25 g (12/18/20 0811)   feeding supplement (OSMOLITE 1.5 CAL) 30 mL/hr at 12/18/20 0453   insulin 2.6 Units/hr (12/18/20 0937)   promethazine (PHENERGAN)25mg  in 44mL NS 25 mg (12/18/20 0306)   TPN ADULT (ION) 65 mL/hr at 12/17/20 1855   TPN ADULT (ION)     PRN Meds: sodium chloride, acetaminophen **OR** acetaminophen, dextrose, guaiFENesin-dextromethorphan, prochlorperazine, promethazine (PHENERGAN)25mg  in 33mL NS, sodium chloride flush, sodium chloride flush   Vital Signs    Vitals:   12/18/20 0344 12/18/20 0422 12/18/20 0611 12/18/20 0732  BP: (!) 149/65  (!) 142/65 (!) 146/60  Pulse: (!) 104   (!) 104  Resp: 19   18  Temp: 97.6 F (36.4 C)   97.9 F (36.6 C)  TempSrc: Oral   Oral  SpO2: 97%   98%  Weight:  72.9 kg    Height:        Intake/Output Summary (Last 24 hours) at 12/18/2020 1020 Last data filed at 12/18/2020 0453 Gross per 24 hour  Intake 711.08 ml  Output 3000 ml  Net -2288.92 ml    Last 3 Weights 12/18/2020 12/17/2020 12/12/2020  Weight (lbs) 160 lb 11.5 oz 125 lb 10.6 oz 125 lb 10.6 oz  Weight (kg) 72.9 kg 57 kg 57 kg      Telemetry    Sinus tachycardia 100-107s - Personally Reviewed  ECG    N/A this AM - Personally Reviewed  Physical Exam   GEN: Appears older than her age, chronic ill appearing  Neck: No JVD Cardiac: RRR, mildly tachycardiac, no murmurs, rubs, or gallops.  Respiratory: Clear to auscultation bilaterally. On 2LNC GI: Soft, nontender, non-distended. DHT in place, feeding infusing at 59ml/hr, emesis yellow/bile colored  MS: BLE 1- edema; No deformity. Neuro:  Alert and oriented x3, quiet speech, follow commands  Psych: Normal affect  LUE PICC in place, TPN infusing   Labs    High Sensitivity Troponin:   Recent Labs  Lab 11/21/20 1937 11/21/20 2136  TROPONINIHS 9 8      Chemistry Recent Labs  Lab 12/15/20 0536 12/15/20 2328 12/17/20 0049 12/17/20 1600 12/18/20 0406  NA 139   < > 138 137 137  K 3.4*   < > 3.6 3.5 3.6  CL 113*   < > 105 104 99  CO2 22   < > 25 28 30   GLUCOSE 176*   < >  207* 167* 122*  BUN <5*   < > <5* 8 13  CREATININE 1.06*   < > 1.03* 0.49 1.06*  CALCIUM 7.4*   < > 7.9* 8.1* 8.4*  PROT 4.2*  --  4.9*  --  5.0*  ALBUMIN 1.5*  --  2.6*  --  2.7*  AST 25  --  16  --  15  ALT 12  --  10  --  8  ALKPHOS 78  --  72  --  67  BILITOT 1.4*  --  1.0  --  1.4*  GFRNONAA >60   < > >60 >60 >60  ANIONGAP 4*   < > 8 5 8    < > = values in this interval not displayed.     Hematology Recent Labs  Lab 12/17/20 0049 12/17/20 1600 12/18/20 0406  WBC 6.1 5.7 5.1  RBC 2.48*  2.48* 2.35* 2.82*  HGB 7.5* 7.0* 8.1*  HCT 22.3* 21.2* 24.9*  MCV 89.9 90.2 88.3  MCH 30.2 29.8 28.7  MCHC 33.6 33.0 32.5  RDW 13.5 13.4 15.4  PLT 177 198 186    BNP Recent Labs  Lab 12/17/20 0049  BNP 2,128.9*     DDimer No results for input(s): DDIMER in the last 168 hours.   Radiology    DG CHEST PORT 1 VIEW  Result  Date: 12/17/2020 CLINICAL DATA:  Radiograph 12/13/2020 EXAM: PORTABLE CHEST 1 VIEW COMPARISON:  CT 11/22/2020 FINDINGS: Perihilar and basilar predominant hazy and patchy opacities with pulmonary vascular congestion and cuffing most consistent with pulmonary edema. Layering bilateral pleural effusions including fluid tracking in the right fissures. No pneumothorax. Cardiac size is similar to comparison prior. Left upper extremity PICC tip terminates at the right atrium. Telemetry leads overlie the chest. Edematous changes seen in the chest wall soft tissues. No acute or worrisome osseous abnormality. IMPRESSION: Findings most compatible with pulmonary edema and developing bilateral effusions. Passive basilar atelectasis is likely present as well. Electronically Signed   By: 01/23/2021 M.D.   On: 12/17/2020 01:59   DG Abd Portable 1V  Result Date: 12/17/2020 CLINICAL DATA:  Feeding tube placement EXAM: PORTABLE ABDOMEN - 1 VIEW COMPARISON:  None. FINDINGS: Feeding tube is transpyloric with the tip in the distal duodenum near the ligament of Treitz. IMPRESSION: Feeding tube tip in the distal duodenum. Electronically Signed   By: 12/19/2020 M.D.   On: 12/17/2020 15:25   ECHOCARDIOGRAM COMPLETE  Result Date: 12/17/2020    ECHOCARDIOGRAM REPORT   Patient Name:   Ashley Camacho Date of Exam: 12/17/2020 Medical Rec #:  12/19/2020       Height:       61.0 in Accession #:    [id number]      Weight:       125.7 lb Date of Birth:  1991-04-10      BSA:          1.550 m Patient Age:    30 years        BP:           122/66 mmHg Patient Gender: F               HR:           103 bpm. Exam Location:  Inpatient Procedure: 2D Echo, 3D Echo, Color Doppler, Cardiac Doppler and Strain Analysis Indications:    I50.21 Acute systolic (congestive) heart failure  History:        Patient has prior  history of Echocardiogram examinations, most                 recent 11/25/2020. Risk Factors:Hypertension and Diabetes. Prior                  performed at [facility].  Sonographer:    Irving Burton Senior RDCS Referring Phys: [id number] ANASTASSIA DOUTOVA  Sonographer Comments: [redacted] weeks pregnant at time of study. IMPRESSIONS  1. Left ventricular ejection fraction, by estimation, is 40 to 45%. The left ventricle has mildly decreased function. The left ventricle demonstrates global hypokinesis. Left ventricular diastolic parameters are indeterminate.  2. Right ventricular systolic function is normal. The right ventricular size is mildly enlarged. There is normal pulmonary artery systolic pressure. The estimated right ventricular systolic pressure is 29.2 mmHg.  3. Right atrial size was mildly dilated.  4. A small pericardial effusion is present.  5. The mitral valve is normal in structure. Mild to moderate mitral valve regurgitation.  6. The aortic valve is tricuspid. Aortic valve regurgitation is not visualized. No aortic stenosis is present.  7. The inferior vena cava is normal in size with greater than 50% respiratory variability, suggesting right atrial pressure of 3 mmHg. FINDINGS  Left Ventricle: Left ventricular ejection fraction, by estimation, is 40 to 45%. The left ventricle has mildly decreased function. The left ventricle demonstrates global hypokinesis. The left ventricular internal cavity size was normal in size. There is  no left ventricular hypertrophy. Left ventricular diastolic parameters are indeterminate. Right Ventricle: The right ventricular size is mildly enlarged. No increase in right ventricular wall thickness. Right ventricular systolic function is normal. There is normal pulmonary artery systolic pressure. The tricuspid regurgitant velocity is 2.56  m/s, and with an assumed right atrial pressure of 3 mmHg, the estimated right ventricular systolic pressure is 29.2 mmHg. Left Atrium: Left atrial size was normal in size. Right Atrium: Right atrial size was mildly dilated. Pericardium: A small pericardial effusion is present. Mitral Valve: The mitral  valve is normal in structure. Mild to moderate mitral valve regurgitation. Tricuspid Valve: The tricuspid valve is normal in structure. Tricuspid valve regurgitation is mild. Aortic Valve: The aortic valve is tricuspid. Aortic valve regurgitation is not visualized. No aortic stenosis is present. Pulmonic Valve: The pulmonic valve was grossly normal. Pulmonic valve regurgitation is trivial. Aorta: The aortic root and ascending aorta are structurally normal, with no evidence of dilitation. Venous: The inferior vena cava is normal in size with greater than 50% respiratory variability, suggesting right atrial pressure of 3 mmHg. IAS/Shunts: The interatrial septum was not well visualized.  LEFT VENTRICLE PLAX 2D LVIDd:         4.10 cm  Diastology LVIDs:         3.50 cm  LV e' medial:    8.70 cm/s LV PW:         1.00 cm  LV E/e' medial:  16.7 LV IVS:        0.90 cm  LV e' lateral:   11.30 cm/s LVOT diam:     1.60 cm  LV E/e' lateral: 12.8 LV SV:         50 LV SV Index:   33 LVOT Area:     2.01 cm  RIGHT VENTRICLE RV S prime:     10.60 cm/s TAPSE (M-mode): 1.6 cm LEFT ATRIUM             Index       RIGHT ATRIUM  Index LA diam:        4.00 cm 2.58 cm/m  RA Area:     17.40 cm LA Vol (A2C):   48.2 ml 31.09 ml/m RA Volume:   49.00 ml  31.61 ml/m LA Vol (A4C):   35.2 ml 22.71 ml/m LA Biplane Vol: 44.4 ml 28.64 ml/m  AORTIC VALVE LVOT Vmax:   123.00 cm/s LVOT Vmean:  93.400 cm/s LVOT VTI:    0.251 m  AORTA Ao Root diam: 2.50 cm Ao Asc diam:  2.70 cm MITRAL VALVE                TRICUSPID VALVE MV Area (PHT): 4.80 cm     TR Peak grad:   26.2 mmHg MV Decel Time: 158 msec     TR Vmax:        256.00 cm/s MV E velocity: 145.00 cm/s MV A velocity: 45.80 cm/s   SHUNTS MV E/A ratio:  3.17         Systemic VTI:  0.25 m                             Systemic Diam: 1.60 cm Epifanio Lescheshristopher Schumann MD Electronically signed by Epifanio Lescheshristopher Schumann MD Signature Date/Time: 12/17/2020/10:41:24 AM    Final    US EKG SITE  RITE  Result Date: 12/17/2020 If Site Rite image not attached, placement could not be confirmed due to current cardiac rhythm.   Cardiac Studies   Echocardiogram 12/16/20: 1. Left ventricular ejection fraction, by estimation, is 40 to 45%. The  left ventricle has mildly decreased function. The left ventricle  demonstrates global hypokinesis. Left ventricular diastolic parameters are  indeterminate.   2. Right ventricular systolic function is normal. The right ventricular  size is mildly enlarged. There is normal pulmonary artery systolic  pressure. The estimated right ventricular systolic pressure is 29.2 mmHg.   3. Right atrial size was mildly dilated.   4. A small pericardial effusion is present.   5. The mitral valve is normal in structure. Mild to moderate mitral valve  regurgitation.   6. The aortic valve is tricuspid. Aortic valve regurgitation is not  visualized. No aortic stenosis is present.   7. The inferior vena cava is normal in size with greater than 50%  respiratory variability, suggesting right atrial pressure of 3 mmHg.   Echocardiogram 11/25/20: INTERPRETATION ---------------------------------------------------------------    MILD LV DYSFUNCTION (See above) - EF 45%   NORMAL LA PRESSURES WITH NORMAL DIASTOLIC FUNCTION    NORMAL RIGHT VENTRICULAR SYSTOLIC FUNCTION    VALVULAR REGURGITATION: TRIVIAL AR, MILD MR, MILD PR, TRIVIAL TR    NO VALVULAR STENOSIS    SMALL PERICARDIAL EFFUSION (See above)    MILD TO MODERATE MR  Patient Profile     Hollice GongMirisha P Rezek is a 30 y.o. female with a PMH of recently diagnosed chronic combined CHF, DM type 1, anemia, and currently pregnant at 25w gestation, cardiology is following since 12/17/2020 for the evaluation of CHF.    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  - BNP elevated to 2100, up from 1100 11/24/20.   - CXR with pulmonary edema and emerging pleural effusions today.  - Echo 12/16/20 with EF 40-45% ([facility]   - Etiology: Too early for peripartum cardiomyopathy.  Query component of Beri- beri, hypoalbuminemia is  contributing to edema. Unlikely her viral infection earlier this month contributed to her cardiomyopathy as EF was already 45% at that time. TSH is wnl. Unlikely low grade tachycardia at 100s has contributed. No atherosclerosis seen on CT chest, no ischemic ECG changes , negative Hs trop, and no angina symptoms to suggest obstructive CAD. No symptoms c/f OSA. She has been diuresing with IV lasix 40mg  TID with UOP net -2.0L since arrival to [facility].  - UOP 3L over the past 24 hours, Net -4.3L since admission, wegith is 125 > 160 ibs?? - Please monitor strict I&Os and daily weights - Renal index uptrending today, Cr 1.09 POA, 0.49 yesterday, 1.06 today, query AKI or incorrect lab value; will down tirtiate IV Lasix 40mg  BID to daily today, unable transition to PO Lasix at this time due to hyperemesis  - GDMT: medication use is limited by pregnancy, labetalol is not indicated for CHF, OK continue hydralazine 10mg  TID via feeding tube, not candidate for ARNI/ACEI/ARB/MRA  HTN - BP mildly elevated, on hydralazine, may need up-titration if needed, coordinate meds change with OB    DM type 1 - Diagnosed at age 33. C/b gastroparesis. Follows with endocrine outpatient with documented brittle diabetes. A1C 5.8 11/21/20. Continue management per primary team. May consider tx for gastroparesis if OK per GYN   Hyperemesis gravida at 25w gestation Hypoalbuminemia Severe protein calorie malnutrition  Chromic normocytic anemia  - albumin has been 1.5-2.7 ranges since June 2022 , pre-albumin 5.8 on 12/17/20  - anemia with Hgb 8 ranges - poorly controlled hyperemesis gravida required multiple hospitalization  - on albumin infusion, TPN + tube feeds currently, monitor for refeeding syndrome/lipid/LFTs  , will discuss with nutrition team regarding thiamine added to tube feeds  - Continue management per primary team and OB    For questions or updates, please contact [facility] Please consult [email or web address] for contact info under        Signed, 01/22/21, NP  12/18/2020, 10:20 AM    I have seen and examined the patient along with 12/19/20, NP .  I have reviewed the chart, notes and new data.  I agree with PA/NP's note.  Key new complaints: Still feeling miserable due to nausea and vomiting, but breathing is better.  Substantial net diuresis.  Weight is clearly inaccurate. Key examination changes: Has puffy generalized edema, but improved Key new findings / data: Suspect that yesterday's creatinine value of 0.49 was erroneous since her creatinine has consistently been greater than 1.0.  There is indeed a slight increase in BUN level, but I think we can continue administering diuretics.  PLAN: Furosemide 40 mg IV daily try to keep negative fluid balance.  We need accurate ins/outs and daily weights. Use of any other medications for heart failure is limited by her inability to tolerate oral medications. Etiology of heart failure unclear, but it is reasonable and safe to give her higher doses of thiamine, just in case this is a thiamine deficiency cardiomyopathy.  Cyndi Bender, MD, [facility] [phone] 12/18/2020, 11:18 AM

## 2020-12-19 ENCOUNTER — Encounter (HOSPITAL_COMMUNITY): Payer: Self-pay | Admitting: Internal Medicine

## 2020-12-19 LAB — GLUCOSE, CAPILLARY
Glucose-Capillary: 133 mg/dL — ABNORMAL HIGH (ref 70–99)
Glucose-Capillary: 136 mg/dL — ABNORMAL HIGH (ref 70–99)
Glucose-Capillary: 113 mg/dL — ABNORMAL HIGH (ref 70–99)
Glucose-Capillary: 126 mg/dL — ABNORMAL HIGH (ref 70–99)
Glucose-Capillary: 123 mg/dL — ABNORMAL HIGH (ref 70–99)
Glucose-Capillary: 115 mg/dL — ABNORMAL HIGH (ref 70–99)
Glucose-Capillary: 140 mg/dL — ABNORMAL HIGH (ref 70–99)
Glucose-Capillary: 121 mg/dL — ABNORMAL HIGH (ref 70–99)
Glucose-Capillary: 114 mg/dL — ABNORMAL HIGH (ref 70–99)
Glucose-Capillary: 110 mg/dL — ABNORMAL HIGH (ref 70–99)
Glucose-Capillary: 96 mg/dL (ref 70–99)
Glucose-Capillary: 105 mg/dL — ABNORMAL HIGH (ref 70–99)
Glucose-Capillary: 111 mg/dL — ABNORMAL HIGH (ref 70–99)
Glucose-Capillary: 122 mg/dL — ABNORMAL HIGH (ref 70–99)
Glucose-Capillary: 127 mg/dL — ABNORMAL HIGH (ref 70–99)
Glucose-Capillary: 130 mg/dL — ABNORMAL HIGH (ref 70–99)
Glucose-Capillary: 119 mg/dL — ABNORMAL HIGH (ref 70–99)
Glucose-Capillary: 111 mg/dL — ABNORMAL HIGH (ref 70–99)
Glucose-Capillary: 117 mg/dL — ABNORMAL HIGH (ref 70–99)
Glucose-Capillary: 119 mg/dL — ABNORMAL HIGH (ref 70–99)

## 2020-12-19 LAB — COMPREHENSIVE METABOLIC PANEL
ALT: 9 U/L (ref 0–44)
AST: 17 U/L (ref 15–41)
Albumin: 2.7 g/dL — ABNORMAL LOW (ref 3.5–5.0)
Alkaline Phosphatase: 59 U/L (ref 38–126)
Anion gap: 5 (ref 5–15)
BUN: 20 mg/dL (ref 6–20)
CO2: 29 mmol/L (ref 22–32)
Calcium: 7.9 mg/dL — ABNORMAL LOW (ref 8.9–10.3)
Chloride: 104 mmol/L (ref 98–111)
Creatinine, Ser: 1.12 mg/dL — ABNORMAL HIGH (ref 0.44–1.00)
GFR, Estimated: >60 mL/min (ref >60)
Glucose, Bld: 129 mg/dL — ABNORMAL HIGH (ref 70–99)
Potassium: 3.9 mmol/L (ref 3.5–5.1)
Sodium: 138 mmol/L (ref 135–145)
Total Bilirubin: 1 mg/dL (ref 0.3–1.2)
Total Protein: 5.2 g/dL — ABNORMAL LOW (ref 6.5–8.1)

## 2020-12-19 LAB — PHOSPHORUS: Phosphorus: 2.8 mg/dL (ref 2.5–4.6)

## 2020-12-19 LAB — MAGNESIUM: Magnesium: 2 mg/dL (ref 1.7–2.4)

## 2020-12-19 LAB — HEPARIN ANTI-XA: Heparin LMW: 0.76 IU/mL

## 2020-12-19 LAB — BETA-HYDROXYBUTYRIC ACID: Beta-Hydroxybutyric Acid: 0.07 mmol/L (ref 0.05–0.27)

## 2020-12-19 MED ORDER — METHYLPREDNISOLONE SODIUM SUCC 125 MG IJ SOLR: 48.0000 mg | Freq: Once | INTRAMUSCULAR | Status: DC

## 2020-12-19 MED ORDER — METHYLPREDNISOLONE 16 MG PO TABS
16.0000 mg | ORAL_TABLET | Freq: Every day | ORAL | Status: DC
  Administered 2020-12-20 – 2020-12-21 (×2): 16 mg via ORAL
  Filled 2020-12-19 (×2): qty 1

## 2020-12-19 MED ORDER — LOPERAMIDE HCL 2 MG PO CAPS
2.0000 mg | ORAL_CAPSULE | ORAL | Status: DC | PRN
  Administered 2021-01-14: 2 mg via ORAL
  Filled 2020-12-19: qty 1

## 2020-12-19 MED ORDER — FOLIC ACID 5 MG/ML IJ SOLN
1.0000 mg | Freq: Every day | INTRAMUSCULAR | Status: DC
  Administered 2020-12-19 – 2021-01-02 (×15): 1 mg via INTRAVENOUS
  Filled 2020-12-19 (×16): qty 0.2

## 2020-12-19 MED ORDER — FOLIC ACID 1 MG PO TABS: 1.0000 mg | ORAL_TABLET | Freq: Every day | ORAL | Status: DC

## 2020-12-19 MED ORDER — METHYLPREDNISOLONE 4 MG PO TABS: 4.0000 mg | ORAL_TABLET | Freq: Every day | ORAL | Status: DC

## 2020-12-19 MED ORDER — METHYLPREDNISOLONE 4 MG PO TABS
8.0000 mg | ORAL_TABLET | Freq: Every day | ORAL | Status: DC
Start: 2020-12-24 — End: 2020-12-21

## 2020-12-19 MED ORDER — METHYLPREDNISOLONE SODIUM SUCC 125 MG IJ SOLR
48.0000 mg | Freq: Once | INTRAMUSCULAR | Status: AC
  Administered 2020-12-19: 48 mg via INTRAVENOUS
  Filled 2020-12-19: qty 2

## 2020-12-19 MED ORDER — ONDANSETRON HCL 4 MG/2ML IJ SOLN
4.0000 mg | Freq: Three times a day (TID) | INTRAMUSCULAR | Status: DC | PRN
  Administered 2020-12-19 – 2020-12-25 (×9): 4 mg via INTRAVENOUS
  Filled 2020-12-19 (×11): qty 2

## 2020-12-19 MED ORDER — METHYLPREDNISOLONE 16 MG PO TABS
16.0000 mg | ORAL_TABLET | Freq: Every day | ORAL | Status: DC
  Administered 2020-12-20: 16 mg via ORAL
  Filled 2020-12-19 (×2): qty 1

## 2020-12-19 MED ORDER — POTASSIUM & SODIUM PHOSPHATES 280-160-250 MG PO PACK
2.0000 | PACK | Freq: Once | ORAL | Status: AC
  Administered 2020-12-19: 2
  Filled 2020-12-19: qty 2

## 2020-12-19 MED ORDER — POTASSIUM CHLORIDE 10 MEQ/100ML IV SOLN
10.0000 meq | Freq: Once | INTRAVENOUS | Status: AC
  Administered 2020-12-19: 10 meq via INTRAVENOUS
  Filled 2020-12-19: qty 100

## 2020-12-19 MED ORDER — METHYLPREDNISOLONE 4 MG PO TABS
8.0000 mg | ORAL_TABLET | Freq: Every day | ORAL | Status: DC
Start: 2020-12-22 — End: 2020-12-21

## 2020-12-19 MED ORDER — METHYLPREDNISOLONE 4 MG PO TABS: 8.0000 mg | ORAL_TABLET | Freq: Every day | ORAL | Status: DC

## 2020-12-19 NOTE — Progress Notes (Addendum)
HOSPITAL MEDICINE OVERNIGHT EVENT NOTE    Notified by nursing that patient has been complaining of "retention all day" with postvoid residual bladder scan revealing 300 cc in the bladder.  In-N-Out intermittent catheterization ordered.  If patient does not urinate over the following 6 hours then a repeat bladder scan will be performed to determine if repeat urinary catheterization is necessary.  Ashley Elk  MD [facility]   ADDENDUM (7/30 6am)  Notified by nursing that patient is yet again unable to urinate with greater than 300 cc noted in the bladder via bladder scan.  We will go ahead and proceed with another intermittent catheterization.  We will additionally obtain urinalysis and urine culture.  We will avoid placing an indwelling Foley catheter for now to avoid risk of catheter associated urinary tract infection.  Ashley Camacho

## 2020-12-19 NOTE — Consult Note (Signed)
MFM Consultation (Delayed note)   Date of Service: 12/18/20 Reason for request: Severe Hyperemesis complicated by DKA in the setting of T1DM. Requesting provider: Dr. Mariel Aloe  Ms. Kaestner is a 30 yo G2P1 who is 54 w 4d today with an EDD of 03/30/21 and is admitted for placement and NG tube placement due to severe hyperemesis gravidarum.  Her pregnancy issues:  1) Type 1 Diabetes that is extremely insulin sensitive. Given her hyperemesis through out her pregnancy her diabetes has not been in good control. She is currently managed with an endotool. She has been managed at Endocrinology at [facility]. Several episodes of hypoglycemia reported. In addition has had admissions for DKA. Early HgbA1c 7.2 with a high of 12.7 % and now 5.6% as of 07/06. She has known neuropathy and gastroparesis. Most recent Cr 1.6 UPC 910. The fetal echocardiogram was normal.  2) Pnuemonia with admission to [facility] from 7/4-20. She was treated with IV antibiotics. She had significan hyperemesis. She was converted to tube feeds but did not tolerate them so had it removed. She had underwent thoracentesis and suffered a right brachial vein DVT . She was discharged home on Lovenox. Later to be readmitted to [facility] due to persistent nausea and vomiting. She was placed on TPN do to poor intake. She was transported to [facility] for further evaluation and NG tube placement.  3) Acute on chronic congestive heart failure with EF of 40-45 % performed on 7/27 which was consistent with the echo performed on 07/05. 07/27exam noted global hypokinesis and enlarged right ventricle. This echocardiogram was performed due to pulmonary edema demonstrated on a CXR prior to [facility] placement. BNP on 07/27 was 2,1289 which is an increase for 07/04.    Vitals with BMI 12/19/2020 12/19/2020 12/19/2020  Height - - -  Weight - - -  BMI - - -  Systolic 157 127 545  Diastolic 96 58 69  Pulse 103 101 110   CBC Latest Ref Rng & Units 12/18/2020  12/17/2020 12/17/2020  WBC 4.0 - 10.5 K/uL 5.1 5.7 6.1  Hemoglobin 12.0 - 15.0 g/dL 8.1(L) 7.0(L) 7.5(L)  Hematocrit 36.0 - 46.0 % 24.9(L) 21.2(L) 22.3(L)  Platelets 150 - 400 K/uL 186 198 177   CMP Latest Ref Rng & Units 12/19/2020 12/18/2020 12/17/2020  Glucose 70 - 99 mg/dL 625(W) 389(H) 734(K)  BUN 6 - 20 mg/dL 20 13 8   Creatinine 0.44 - 1.00 mg/dL ) 8.76(O) 1.15(B  Sodium 135 - 145 mmol/L 138 137 137  Potassium 3.5 - 5.1 mmol/L 3.9 3.6 3.5  Chloride 98 - 111 mmol/L 104 99 104  CO2 22 - 32 mmol/L 29 30 28   Calcium 8.9 - 10.3 mg/dL 7.9(L) 8.4(L) 8.1(L)  Total Protein 6.5 - 8.1 g/dL 5.2(L) 5.0(L) -  Total Bilirubin 0.3 - 1.2 mg/dL 1.0 2.62) -  Alkaline Phos 38 - 126 U/L 59 67 -  AST 15 - 41 U/L 17 15 -  ALT 0 - 44 U/L 9 8 -    Imaging: ordered/  Impression/Counseling:  Maternal: I discussed with Ms. Majkowski the complexity of her care including pregnancy DVT on Lovenox, CHF, hypoalbuminemia resulting in retained fluid, hypereemsis with poorly controlled T1DM requiring IV steroids and tight blood sugar control.  She has a history of cesarean delivery and preterm delivery. She has no signs or symptoms of preterm labor at this time.   Recommendations: 1) Cardiology- agree with plan for Lasix, with close evaluation of and replacement of electrolytes. Appreciate further  recommendations from cardiology. Consider repeat echocardiogram even if stable between 28-32 weeks. Please call MFM for medical therapy that has uncertain safety profile in pregnancy but generally ionotrope support, diuretics have safe therapies in the drug class.   2) Hyperemesis with diabetes management.- Agree with plan for steroid taper (Methylpredisolone) with IV insulin support until control of HG is achieved. Once achieved consider appropriate insulin regimen per diabetic coordinator.Agree with recommended ranges of 90-120 mg/dL per diabetic coordinator.  Consider maintain NG tube untile tolerating 1000kcal/daily  orally.   3) DVT-continue therapeutic Lovenox- may consider transition to heparin if signs or symptoms of preterm labor occur. Discontinue 24 hours prior to delivery. Restart 4-6 hour after vaginal delivery and 6-8 hours after cesarean delivery.    Fetal:  1) Cardiology- See above  2) Diabetes- daily NST to assess for uterine contraction and fetal decelerations. Growth exam at first availability and repeat growth 3 weeks from then.  3) Preterm labor- consider BMZ at that time with insulin support.  4) Mode of delivery. She is a TOLAC candidate, however given the illness of the patient individualize care is recommended.  I spent 80 minutes with > 50% in face to face consultation and care coordination.  I discussed with Dr. Donavan Foil and he is agreement with this plan.  Novella Olive, MD.

## 2020-12-19 NOTE — Progress Notes (Signed)
Inpatient Diabetes Program Recommendations  AACE/ADA: New Consensus Statement on Inpatient Glycemic Control (2015)  Target Ranges:  Prepandial:   less than 140 mg/dL      Peak postprandial:   less than 180 mg/dL (1-2 hours)      Critically ill patients:  140 - 180 mg/dL   Lab Results  Component Value Date   GLUCAP 115 (H) 12/19/2020   HGBA1C 5.8 (H) 11/21/2020    Review of Glycemic Control Results for Ashley Camacho, Ashley Camacho (MRN [id number]) as of 12/19/2020 08:24  Ref. Range 12/19/2020 03:20 12/19/2020 04:42 12/19/2020 05:40 12/19/2020 06:48  Glucose-Capillary Latest Ref Range: 70 - 99 mg/dL 086 (H) 761 (H) 950 (H) 115 (H)  Diabetes history: Type 1 DM Current orders for Inpatient glycemic control: IV insulin Osmolite @ 65 ml/hr TPN- stopped Steroids- Solumedrol/Decadron taper   Inpatient Diabetes Program Recommendations:   As discussed, continue with IV insulin.  Please do not transition off insulin drip without discussing with Diabetes Coordinator and pharmacy to make sure that insulin needs are covered adequately. Following.   Thanks, Lujean Rave, MSN, RNC-OB Diabetes Coordinator [phone] (8a-5p)

## 2020-12-19 NOTE — Consult Note (Addendum)
PHARMACY - TOTAL PARENTERAL NUTRITION CONSULT NOTE  Indication:   inability to tolerate post-pyloric dobhoff and signs of starvation ISO pregnancy  Patient Measurements: Body mass index is 28.48 kg/m. Usual Weight: 57kg   Assessment: 29 YOF at 24-[redacted] weeks gestation with a h/o type 1 diabetes. Patient with hyperemesis gravidarum. 1 month or greater of poor po intake. Patient did not tolerate NGT at [facility] and currently does not want to attempt post-pyloric dobhoff placement. She was started on TPN 7/25 for hyperemesis gravidarum, severe PCM from poor po intake.  - PMH: DM type 1, gastroparesis, HTN, CHF, h/o DVT, anemia, recently treated at [facility] for sepsis secondary to pneumonia and was there for 2 weeks.  Glucose / Insulin: T1DM with A1c 5.8% - CBGs controlled on insulin gtt (Endotool goal should be 90-120 in pregnancy, Medrol dose pack for hyperemesis to end 8/11) Electrolytes: starvation ketoacidosis - lytes all WNL (Phos down to low normal) 7/27: FA 19.8 WNL and trending down, B12 elevated at 1146 Renal: SCr up 1.12, BUN WNL Hepatic: LFTs / TG WNL, tbili normalized, prealbumin only 5.8. Intake / Output; MIVF: UOP with Lasix 40mg  IV daily (anasarca, 3rd spacing, EF 40-45%), stool , emesis x2  GI Imaging: N/A GI Surgeries / Procedures: N/A  Central access: PICC (7/24)>>7/27 Triple lumen PICC replaced TPN start date: 12/15/20  Nutritional Goals (per RD rec on 7/26): kCal: 1900-2200 Protein: 85-95g of protein, Fluid: 1.4-1.7 L/day (has HF)  Current Nutrition:  Prosource 70ml BID - 2 charted gien Osmolite at goal rate 50 ml/hr - some vomiting not different from prior to feeding per RN TPN has been off since 7/28 AM  Plan:  D/C TPN consult with TF at goal rate per discussion with MD - please reconsult if needed Daily prenatal multivitamin PO as ordered Thiamine 500mg  IV Q24H per MD Insulin infusion per MD/diabetes coordinator  Phos NAK 2 packets PT x 1,  further supplementation per MD Add folic acid IV daily since off TPN and uncertain at this time whether patient will continue to keep down PO prenatal multivitamin   Ashley Camacho D. , PharmD, BCPS, BCCCP 12/19/2020, 9:50 AM

## 2020-12-19 NOTE — Progress Notes (Signed)
FACULTY PRACTICE ANTEPARTUM PROGRESS NOTE  Ashley Camacho is a 30 y.o. G2P0101 at [redacted]w[redacted]d who is admitted for hyperemesis/gastroparesis in setting of Type 1 DM.  Estimated Date of Delivery: 03/30/21 Fetal presentation is unsure.  Length of Stay:  3 Days. Admitted 12/16/2020  Subjective: Pt seen.  She appears more lively today.  NG tube in place and TPN has been discontinued.  She noted emesis x 1 at time of eval, but then also vomited after I left.  Pt noted her nausea level was about a 3 yesterday and is about the same or slightly less today. Patient reports she is unsure about fetal movement.  She denies uterine contractions, denies bleeding and leaking of fluid per vagina.  Vitals:  Blood pressure (!) 157/96, pulse (!) 103, temperature 97.9 F (36.6 C), temperature source Oral, resp. rate 20, height 5\' 1"  (1.549 m), weight 68.4 kg, SpO2 92 %. Physical Examination: CONSTITUTIONAL: Well-developed, well-nourished female in no acute distress.  HENT:  Normocephalic, atraumatic, External right and left ear normal. Oropharynx is clear and moist EYES: Conjunctivae and EOM are normal.  NECK: Normal range of motion, supple, no masses. SKIN: Skin is warm and dry. No rash noted. Not diaphoretic. No erythema. No pallor. NEUROLGIC: Alert and oriented to person, place, and time. Normal reflexes, muscle tone coordination. No cranial nerve deficit noted. PSYCHIATRIC: Normal mood and affect. Normal behavior. Normal judgment and thought content. CARDIOVASCULAR: mild tachycardia noted, regular rhythm RESPIRATORY: Effort and breath sounds normal, no problems with respiration noted MUSCULOSKELETAL: Normal range of motion. No edema and no tenderness. ABDOMEN: Soft, nontender, nondistended, gravid. CERVIX: deferred  Fetal monitoring: pending  Results for orders placed or performed during the hospital encounter of 12/16/20 (from the past 48 hour(s))  Glucose, capillary     Status: Abnormal   Collection Time:  12/17/20  1:06 PM  Result Value Ref Range   Glucose-Capillary 158 (H) 70 - 99 mg/dL    Comment: Glucose reference range applies only to samples taken after fasting for at least 8 hours.  Glucose, capillary     Status: Abnormal   Collection Time: 12/17/20  2:14 PM  Result Value Ref Range   Glucose-Capillary 191 (H) 70 - 99 mg/dL    Comment: Glucose reference range applies only to samples taken after fasting for at least 8 hours.  Glucose, capillary     Status: Abnormal   Collection Time: 12/17/20  3:13 PM  Result Value Ref Range   Glucose-Capillary 196 (H) 70 - 99 mg/dL    Comment: Glucose reference range applies only to samples taken after fasting for at least 8 hours.  CBC     Status: Abnormal   Collection Time: 12/17/20  4:00 PM  Result Value Ref Range   WBC 5.7 4.0 - 10.5 K/uL   RBC 2.35 (L) 3.87 - 5.11 MIL/uL   Hemoglobin 7.0 (L) 12.0 - 15.0 g/dL    Comment: REPEATED TO VERIFY   HCT 21.2 (L) 36.0 - 46.0 %   MCV 90.2 80.0 - 100.0 fL   MCH 29.8 26.0 - 34.0 pg   MCHC 33.0 30.0 - 36.0 g/dL   RDW 12/19/20 18.2 - 99.3 %   Platelets 198 150 - 400 K/uL   nRBC 0.0 0.0 - 0.2 %    Comment: Performed at [facility], [address], [place], [place]  Basic metabolic panel     Status: Abnormal   Collection Time: 12/17/20  4:00 PM  Result Value Ref  Range   Sodium 137 135 - 145 mmol/L   Potassium 3.5 3.5 - 5.1 mmol/L   Chloride 104 98 - 111 mmol/L   CO2 28 22 - 32 mmol/L   Glucose, Bld 167 (H) 70 - 99 mg/dL    Comment: Glucose reference range applies only to samples taken after fasting for at least 8 hours.   BUN 8 6 - 20 mg/dL   Creatinine, Ser 7.51 0.44 - 1.00 mg/dL   Calcium 8.1 (L) 8.9 - 10.3 mg/dL   GFR, Estimated >02 >58 mL/min    Comment: (NOTE) Calculated using the CKD-EPI Creatinine Equation (2021)    Anion gap 5 5 - 15    Comment: Performed at [facility], [address], [place], [place] [address]  Beta-hydroxybutyric acid     Status: None    Collection Time: 12/17/20  4:00 PM  Result Value Ref Range   Beta-Hydroxybutyric Acid 0.08 0.05 - 0.27 mmol/L    Comment: Performed at [facility], [address], [place], [place] [address]  Glucose, capillary     Status: Abnormal   Collection Time: 12/17/20  4:17 PM  Result Value Ref Range   Glucose-Capillary 142 (H) 70 - 99 mg/dL    Comment: Glucose reference range applies only to samples taken after fasting for at least 8 hours.  Glucose, capillary     Status: Abnormal   Collection Time: 12/17/20  5:26 PM  Result Value Ref Range   Glucose-Capillary 173 (H) 70 - 99 mg/dL    Comment: Glucose reference range applies only to samples taken after fasting for at least 8 hours.  Glucose, capillary     Status: Abnormal   Collection Time: 12/17/20  6:16 PM  Result Value Ref Range   Glucose-Capillary 141 (H) 70 - 99 mg/dL    Comment: Glucose reference range applies only to samples taken after fasting for at least 8 hours.  Glucose, capillary     Status: Abnormal   Collection Time: 12/17/20  7:14 PM  Result Value Ref Range   Glucose-Capillary 144 (H) 70 - 99 mg/dL    Comment: Glucose reference range applies only to samples taken after fasting for at least 8 hours.  Glucose, capillary     Status: Abnormal   Collection Time: 12/17/20  8:20 PM  Result Value Ref Range   Glucose-Capillary 160 (H) 70 - 99 mg/dL    Comment: Glucose reference range applies only to samples taken after fasting for at least 8 hours.  Prepare RBC (crossmatch)     Status: None   Collection Time: 12/17/20  8:47 PM  Result Value Ref Range   Order Confirmation      ORDER PROCESSED BY BLOOD BANK Performed at [facility], [address] [place], [place] [address]   Glucose, capillary     Status: Abnormal   Collection Time: 12/17/20  9:23 PM  Result Value Ref Range   Glucose-Capillary 160 (H) 70 - 99 mg/dL    Comment: Glucose reference range applies only to samples taken after fasting for at least 8  hours.  Glucose, capillary     Status: Abnormal   Collection Time: 12/17/20 10:29 PM  Result Value Ref Range   Glucose-Capillary 148 (H) 70 - 99 mg/dL    Comment: Glucose reference range applies only to samples taken after fasting for at least 8 hours.  Glucose, capillary     Status: Abnormal   Collection Time: 12/17/20 11:36 PM  Result Value Ref Range   Glucose-Capillary 131 (H) 70 - 99 mg/dL    Comment: Glucose reference range applies only to samples taken after fasting for at least 8 hours.  Glucose, capillary     Status: Abnormal   Collection Time: 12/18/20 12:30 AM  Result Value Ref Range   Glucose-Capillary 138 (H) 70 - 99 mg/dL    Comment: Glucose reference range applies only to samples taken after fasting for at least 8 hours.  Glucose, capillary     Status: Abnormal   Collection Time: 12/18/20  1:40 AM  Result Value Ref Range   Glucose-Capillary 127 (H) 70 - 99 mg/dL    Comment: Glucose reference range applies only to samples taken after fasting for at least 8 hours.  Glucose, capillary     Status: Abnormal   Collection Time: 12/18/20  2:30 AM  Result Value Ref Range   Glucose-Capillary 131 (H) 70 - 99 mg/dL    Comment: Glucose reference range applies only to samples taken after fasting for at least 8 hours.  Comprehensive metabolic panel     Status: Abnormal   Collection Time: 12/18/20  4:06 AM  Result Value Ref Range   Sodium 137 135 - 145 mmol/L   Potassium 3.6 3.5 - 5.1 mmol/L   Chloride 99 98 - 111 mmol/L   CO2 30 22 - 32 mmol/L   Glucose, Bld 122 (H) 70 - 99 mg/dL    Comment: Glucose reference range applies only to samples taken after fasting for at least 8 hours.   BUN 13 6 - 20 mg/dL   Creatinine, Ser 4.92 (H) 0.44 - 1.00 mg/dL   Calcium 8.4 (L) 8.9 - 10.3 mg/dL   Total Protein 5.0 (L) 6.5 - 8.1 g/dL   Albumin 2.7 (L) 3.5 - 5.0 g/dL   AST 15 15 - 41 U/L   ALT 8 0 - 44 U/L   Alkaline Phosphatase 67 38 - 126 U/L   Total Bilirubin 1.4 (H) 0.3 - 1.2 mg/dL    GFR, Estimated >01 >00 mL/min    Comment: (NOTE) Calculated using the CKD-EPI Creatinine Equation (2021)    Anion gap 8 5 - 15    Comment: Performed at [facility], [address] [place], [place] [address]  Magnesium     Status: None   Collection Time: 12/18/20  4:06 AM  Result Value Ref Range   Magnesium 2.0 1.7 - 2.4 mg/dL    Comment: Performed at [facility], [address] [place], [place] [address]  Phosphorus     Status: None   Collection Time: 12/18/20  4:06 AM  Result Value Ref Range   Phosphorus 4.2 2.5 - 4.6 mg/dL    Comment: Performed at [facility], [address], [place], [place] [address]  Beta-hydroxybutyric acid     Status: Abnormal   Collection Time: 12/18/20  4:06 AM  Result Value Ref Range   Beta-Hydroxybutyric Acid <0.05 (L) 0.05 - 0.27 mmol/L    Comment: REPEATED TO VERIFY Performed at [facility], [address] [place], [place] [address]   CBC     Status: Abnormal   Collection Time: 12/18/20  4:06 AM  Result Value Ref Range   WBC 5.1 4.0 - 10.5 K/uL   RBC 2.82 (L) 3.87 - 5.11 MIL/uL   Hemoglobin 8.1 (L) 12.0 - 15.0 g/dL   HCT 83.0 (L) 94.0 - 76.8 %   MCV 88.3 80.0 - 100.0  fL   MCH 28.7 26.0 - 34.0 pg   MCHC 32.5 30.0 - 36.0 g/dL   RDW 16.115.4 09.611.5 - 04.515.5 %   Platelets 186 150 - 400 K/uL   nRBC 0.0 0.0 - 0.2 %    Comment: Performed at [facility], [address] [place], [place] [id number]  Glucose, capillary     Status: Abnormal   Collection Time: 12/18/20  4:31 AM  Result Value Ref Range   Glucose-Capillary 121 (H) 70 - 99 mg/dL    Comment: Glucose reference range applies only to samples taken after fasting for at least 8 hours.  Glucose, capillary     Status: Abnormal   Collection Time: 12/18/20  6:30 AM  Result Value Ref Range   Glucose-Capillary 168 (H) 70 - 99 mg/dL    Comment: Glucose reference range applies only to samples taken after fasting for at least 8 hours.  Glucose, capillary     Status: Abnormal    Collection Time: 12/18/20  7:28 AM  Result Value Ref Range   Glucose-Capillary 145 (H) 70 - 99 mg/dL    Comment: Glucose reference range applies only to samples taken after fasting for at least 8 hours.  Glucose, capillary     Status: Abnormal   Collection Time: 12/18/20  8:39 AM  Result Value Ref Range   Glucose-Capillary 142 (H) 70 - 99 mg/dL    Comment: Glucose reference range applies only to samples taken after fasting for at least 8 hours.  Glucose, capillary     Status: Abnormal   Collection Time: 12/18/20  9:34 AM  Result Value Ref Range   Glucose-Capillary 146 (H) 70 - 99 mg/dL    Comment: Glucose reference range applies only to samples taken after fasting for at least 8 hours.  Glucose, capillary     Status: Abnormal   Collection Time: 12/18/20 10:29 AM  Result Value Ref Range   Glucose-Capillary 106 (H) 70 - 99 mg/dL    Comment: Glucose reference range applies only to samples taken after fasting for at least 8 hours.  Glucose, capillary     Status: Abnormal   Collection Time: 12/18/20 11:43 AM  Result Value Ref Range   Glucose-Capillary 127 (H) 70 - 99 mg/dL    Comment: Glucose reference range applies only to samples taken after fasting for at least 8 hours.  Glucose, capillary     Status: Abnormal   Collection Time: 12/18/20 12:42 PM  Result Value Ref Range   Glucose-Capillary 157 (H) 70 - 99 mg/dL    Comment: Glucose reference range applies only to samples taken after fasting for at least 8 hours.  Glucose, capillary     Status: Abnormal   Collection Time: 12/18/20  1:42 PM  Result Value Ref Range   Glucose-Capillary 156 (H) 70 - 99 mg/dL    Comment: Glucose reference range applies only to samples taken after fasting for at least 8 hours.  Glucose, capillary     Status: Abnormal   Collection Time: 12/18/20  2:50 PM  Result Value Ref Range   Glucose-Capillary 131 (H) 70 - 99 mg/dL    Comment: Glucose reference range applies only to samples taken after fasting for  at least 8 hours.  Glucose, capillary     Status: Abnormal   Collection Time: 12/18/20  3:50 PM  Result Value Ref Range   Glucose-Capillary 115 (H) 70 - 99 mg/dL    Comment: Glucose reference range applies only to samples taken  after fasting for at least 8 hours.  Glucose, capillary     Status: Abnormal   Collection Time: 12/18/20  4:57 PM  Result Value Ref Range   Glucose-Capillary 146 (H) 70 - 99 mg/dL    Comment: Glucose reference range applies only to samples taken after fasting for at least 8 hours.  Glucose, capillary     Status: Abnormal   Collection Time: 12/18/20  6:02 PM  Result Value Ref Range   Glucose-Capillary 157 (H) 70 - 99 mg/dL    Comment: Glucose reference range applies only to samples taken after fasting for at least 8 hours.  Glucose, capillary     Status: Abnormal   Collection Time: 12/18/20  7:01 PM  Result Value Ref Range   Glucose-Capillary 150 (H) 70 - 99 mg/dL    Comment: Glucose reference range applies only to samples taken after fasting for at least 8 hours.  Glucose, capillary     Status: Abnormal   Collection Time: 12/18/20  8:04 PM  Result Value Ref Range   Glucose-Capillary 107 (H) 70 - 99 mg/dL    Comment: Glucose reference range applies only to samples taken after fasting for at least 8 hours.  Glucose, capillary     Status: Abnormal   Collection Time: 12/18/20  9:26 PM  Result Value Ref Range   Glucose-Capillary 111 (H) 70 - 99 mg/dL    Comment: Glucose reference range applies only to samples taken after fasting for at least 8 hours.  Glucose, capillary     Status: Abnormal   Collection Time: 12/18/20 10:51 PM  Result Value Ref Range   Glucose-Capillary 133 (H) 70 - 99 mg/dL    Comment: Glucose reference range applies only to samples taken after fasting for at least 8 hours.  Glucose, capillary     Status: Abnormal   Collection Time: 12/18/20 11:52 PM  Result Value Ref Range   Glucose-Capillary 151 (H) 70 - 99 mg/dL    Comment: Glucose  reference range applies only to samples taken after fasting for at least 8 hours.  Glucose, capillary     Status: Abnormal   Collection Time: 12/19/20  1:45 AM  Result Value Ref Range   Glucose-Capillary 136 (H) 70 - 99 mg/dL    Comment: Glucose reference range applies only to samples taken after fasting for at least 8 hours.  Glucose, capillary     Status: Abnormal   Collection Time: 12/19/20  2:13 AM  Result Value Ref Range   Glucose-Capillary 133 (H) 70 - 99 mg/dL    Comment: Glucose reference range applies only to samples taken after fasting for at least 8 hours.  Comprehensive metabolic panel     Status: Abnormal   Collection Time: 12/19/20  3:13 AM  Result Value Ref Range   Sodium 138 135 - 145 mmol/L   Potassium 3.9 3.5 - 5.1 mmol/L   Chloride 104 98 - 111 mmol/L   CO2 29 22 - 32 mmol/L   Glucose, Bld 129 (H) 70 - 99 mg/dL    Comment: Glucose reference range applies only to samples taken after fasting for at least 8 hours.   BUN 20 6 - 20 mg/dL   Creatinine, Ser 1.61 (H) 0.44 - 1.00 mg/dL   Calcium 7.9 (L) 8.9 - 10.3 mg/dL   Total Protein 5.2 (L) 6.5 - 8.1 g/dL   Albumin 2.7 (L) 3.5 - 5.0 g/dL   AST 17 15 - 41 U/L   ALT 9 0 -  44 U/L   Alkaline Phosphatase 59 38 - 126 U/L   Total Bilirubin 1.0 0.3 - 1.2 mg/dL   GFR, Estimated >96 >04 mL/min    Comment: (NOTE) Calculated using the CKD-EPI Creatinine Equation (2021)    Anion gap 5 5 - 15    Comment: Performed at [facility], [address] [place], [place] [address]  Beta-hydroxybutyric acid     Status: None   Collection Time: 12/19/20  3:13 AM  Result Value Ref Range   Beta-Hydroxybutyric Acid 0.07 0.05 - 0.27 mmol/L    Comment: Performed at [facility], [address], [place], [place] [address]  Magnesium     Status: None   Collection Time: 12/19/20  3:13 AM  Result Value Ref Range   Magnesium 2.0 1.7 - 2.4 mg/dL    Comment: Performed at [facility], [address] [place], [place] [address]   Phosphorus     Status: None   Collection Time: 12/19/20  3:13 AM  Result Value Ref Range   Phosphorus 2.8 2.5 - 4.6 mg/dL    Comment: Performed at [facility], [address] [place], [place] [address]  Glucose, capillary     Status: Abnormal   Collection Time: 12/19/20  3:20 AM  Result Value Ref Range   Glucose-Capillary 113 (H) 70 - 99 mg/dL    Comment: Glucose reference range applies only to samples taken after fasting for at least 8 hours.  Glucose, capillary     Status: Abnormal   Collection Time: 12/19/20  4:42 AM  Result Value Ref Range   Glucose-Capillary 126 (H) 70 - 99 mg/dL    Comment: Glucose reference range applies only to samples taken after fasting for at least 8 hours.  Glucose, capillary     Status: Abnormal   Collection Time: 12/19/20  5:40 AM  Result Value Ref Range   Glucose-Capillary 123 (H) 70 - 99 mg/dL    Comment: Glucose reference range applies only to samples taken after fasting for at least 8 hours.  Glucose, capillary     Status: Abnormal   Collection Time: 12/19/20  6:48 AM  Result Value Ref Range   Glucose-Capillary 115 (H) 70 - 99 mg/dL    Comment: Glucose reference range applies only to samples taken after fasting for at least 8 hours.  Glucose, capillary     Status: Abnormal   Collection Time: 12/19/20  8:12 AM  Result Value Ref Range   Glucose-Capillary 140 (H) 70 - 99 mg/dL    Comment: Glucose reference range applies only to samples taken after fasting for at least 8 hours.  Glucose, capillary     Status: Abnormal   Collection Time: 12/19/20  9:37 AM  Result Value Ref Range   Glucose-Capillary 121 (H) 70 - 99 mg/dL    Comment: Glucose reference range applies only to samples taken after fasting for at least 8 hours.   Comment 1 Notify RN    Comment 2 Document in Chart   Glucose, capillary     Status: Abnormal   Collection Time: 12/19/20 10:41 AM  Result Value Ref Range   Glucose-Capillary 114 (H) 70 - 99 mg/dL    Comment: Glucose  reference range applies only to samples taken after fasting for at least 8 hours.  Glucose, capillary     Status: Abnormal   Collection Time: 12/19/20 11:37 AM  Result Value Ref Range   Glucose-Capillary 110 (H) 70 - 99  mg/dL    Comment: Glucose reference range applies only to samples taken after fasting for at least 8 hours.   Comment 1 Notify RN    Comment 2 Document in Chart     I have reviewed the patient's current medications.  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  She is tolerating 50 cc/hr.  Insulin drip per medicine for control of blood sugars, especially with steroids being utilized.  Hyperemesis:  Continue with tube feeds as tolerated.  Per pt her nausea level is 3/10.  Continue with antiemetics, especially reglan.  Would d/c steroids in 1-2 days as the utility may be limited in setting of gastroparesis.  CHF:  per medicine/Cards  25 week pregnancy:  daily NST and FHT, will get growth scan next week    Continue routine antenatal care.   Mariel Aloe, MD [facility] Faculty Attending, [facility] 12/19/2020 12:44 PM

## 2020-12-19 NOTE — Progress Notes (Signed)
Cortrak Tube Team Note:  Received page regarding pt's Cortrak tube. Concern that pt inadvertently pulled Cortrak tube out some. Upon assessment, Cortrak at 96 cm. Original placement documented at 97 cm. Abd xray at that time indicating tube near LOT. Based on this information, Cortrak tube still post-pyloric and patent. OK to use. Discussed with RN  If the tube becomes dislodged please keep the tube and contact the Cortrak team at [email or web address] (password [email or web address]) for replacement.  If after hours and replacement cannot be delayed, place a NG tube and confirm placement with an abdominal x-ray.   Romelle Starcher MS, RDN, LDN, CNSC Registered Dietitian III Clinical Nutrition RD Pager and On-Call Pager Number Located in [place]

## 2020-12-19 NOTE — Progress Notes (Signed)
QTC is 51, so we will continue mirtazapine, keep potassium >4, magnesium> 2, change Zofran to as needed.Marland Kitchen Marland KitchenHuey Bienenstock MD

## 2020-12-19 NOTE — Progress Notes (Signed)
During shift assessment, patient stated she had been experiencing urinary retention all day.RN Called Dr.Pickens. Provider gave order to do a bladder scan. Bladder scan complete with volume of . RN returned call to Dr, Vergie Living with results and was given the verbal order to speak with hospitalist. RN to call. Raelyn Ensign, RN

## 2020-12-19 NOTE — Evaluation (Signed)
Physical Therapy Evaluation Patient Details Name: Ashley Camacho MRN: [id number] DOB: 08-22-1990 Today's Date: 12/19/2020   History of Present Illness  30 y.o. female admitted for hyperemesis gravidarum. Medical history significant of [redacted]w[redacted]d on presentation, Estimated Date of Delivery: 03/30/21 by by 9 week ultrasound, Type 1 diabetic, Cardiomyopathy, chronic systolic heart failure, DVT in bilateral arm. H/o recent hospitalizations d/t hyperemesis and sepsis secondary to pneumonia  Clinical Impression  Pt presents to PT with decr mobility due to illness and inactivity. Expect pt will make steady progress back to baseline with mobility. Will follow acutely but doubt pt will need PT after DC.      Follow Up Recommendations No PT follow up    Equipment Recommendations  None recommended by PT    Recommendations for Other Services       Precautions / Restrictions        Mobility  Bed Mobility Overal bed mobility: Needs Assistance Bed Mobility: Supine to Sit     Supine to sit: Min guard;HOB elevated     General bed mobility comments: incr time    Transfers Overall transfer level: Needs assistance Equipment used: None Transfers: Sit to/from [employer] Sit to Stand: Min guard Stand pivot transfers: Min guard       General transfer comment: Assist for safety and lines  Ambulation/Gait Ambulation/Gait assistance: Min guard Gait Distance (Feet): 150 Feet Assistive device: None Gait Pattern/deviations: Step-through pattern;Decreased stride length Gait velocity: decr Gait velocity interpretation: <1.31 ft/sec, indicative of household ambulator General Gait Details: Assist for safety. Pt intermittently using wall rail  Stairs            Wheelchair Mobility    Modified Rankin (Stroke Patients Only)       Balance Overall balance assessment: Mild deficits observed, not formally tested                                            Pertinent Vitals/Pain Pain Assessment: Faces Faces Pain Scale: No hurt    Home Living Family/patient expects to be discharged to:: Private residence Living Arrangements: Other relatives;Other (Comment) (family) Available Help at Discharge: Available 24 hours/day Type of Home: House Home Access: Stairs to enter Entrance Stairs-Rails: Left Entrance Stairs-Number of Steps: 3 Home Layout: Two level;Full bath on main level (pt reports she has been sleeping on the couch, bedroom is normally upstairs) Home Equipment: None      Prior Function Level of Independence: Independent         Comments: does not work     Higher education careers adviser   Dominant Hand: Left    Extremity/Trunk Assessment   Upper Extremity Assessment Upper Extremity Assessment: Defer to OT evaluation    Lower Extremity Assessment Lower Extremity Assessment: Generalized weakness    Cervical / Trunk Assessment Cervical / Trunk Assessment: Other exceptions Cervical / Trunk Exceptions: second trimester of pregnancy  Communication      Cognition Arousal/Alertness: Awake/alert Behavior During Therapy: Flat affect Overall Cognitive Status: Within Functional Limits for tasks assessed                                        General Comments General comments (skin integrity, edema, etc.): Pt on 1L of O2 at rest with SpO2 at 97%. Amb on RA with SpO2  to 89% and rebounded to 93% after sitting. Left O2 off and RN aware.    Exercises     Assessment/Plan    PT Assessment Patient needs continued PT services  PT Problem List Decreased strength;Decreased activity tolerance;Decreased mobility       PT Treatment Interventions Gait training;Functional mobility training;Therapeutic activities;Therapeutic exercise;Patient/family education    PT Goals (Current goals can be found in the Care Plan section)  Acute Rehab PT Goals Patient Stated Goal: did not state PT Goal Formulation: With patient Time For  Goal Achievement: 01/02/21 Potential to Achieve Goals: Good    Frequency Min 3X/week   Barriers to discharge        Co-evaluation               AM-PAC PT "6 Clicks" Mobility  Outcome Measure Help needed turning from your back to your side while in a flat bed without using bedrails?: None Help needed moving from lying on your back to sitting on the side of a flat bed without using bedrails?: A Little Help needed moving to and from a bed to a chair (including a wheelchair)?: A Little Help needed standing up from a chair using your arms (e.g., wheelchair or bedside chair)?: A Little Help needed to walk in hospital room?: A Little Help needed climbing 3-5 steps with a railing? : A Little 6 Click Score: 19    End of Session   Activity Tolerance: Patient limited by fatigue Patient left: in chair;with call bell/phone within reach Nurse Communication: Mobility status;Other (comment) (Left O2 off) PT Visit Diagnosis: Other abnormalities of gait and mobility (R26.89);Muscle weakness (generalized) (M62.81)    Time: 8657-8469 PT Time Calculation (min) (ACUTE ONLY): 26 min   Charges:   PT Evaluation $PT Eval Moderate Complexity: 1 Mod PT Treatments $Gait Training: 8-22 mins        [facility] PT Acute Rehabilitation Services Pager [phone] Office [phone]   Angelina Ok [facility] 12/19/2020, 11:35 AM

## 2020-12-19 NOTE — Progress Notes (Signed)
Dr. Vergie Living referred RN to hospitalist following Bladder scan. RN sent page to Dr.  Leafy Half. Raelyn Ensign, RN

## 2020-12-19 NOTE — Progress Notes (Addendum)
Progress Note  Patient Name: Ashley Camacho Date of Encounter: 12/19/2020  [facility] Cardiologist: New to Dr. Royann Shivers  Subjective   Patient is sitting in the chair this morning, states she is feeling improved with SOB, still having frequent emesis. She is able to pass BMs. She denied any chest pain. She states her baby is growing well.    Inpatient Medications    Scheduled Meds:  bacitracin   Topical BID   Chlorhexidine Gluconate Cloth  6 each Topical Daily   enoxaparin  70 mg Subcutaneous Q12H   feeding supplement (PROSource TF)  45 mL Per Tube BID   ferrous sulfate  300 mg Per Tube Q breakfast   folic acid  1 mg Intravenous Daily   furosemide  40 mg Intravenous Daily   hydrALAZINE  10 mg Per Tube Q8H   mouth rinse  15 mL Mouth Rinse q12n4p   [START ON 12/20/2020] methylPREDNISolone  16 mg Oral Q breakfast   Followed by   Melene Muller ON 12/24/2020] methylPREDNISolone  8 mg Oral Q breakfast   Followed by   Melene Muller ON 12/31/2020] methylPREDNISolone  4 mg Oral Q breakfast   [START ON 12/20/2020] methylPREDNISolone  16 mg Oral Q1400   Followed by   Melene Muller ON 12/22/2020] methylPREDNISolone  8 mg Oral Q1400   Followed by   Melene Muller ON 12/25/2020] methylPREDNISolone  4 mg Oral Q1400   [START ON 12/20/2020] methylPREDNISolone  16 mg Oral QHS   Followed by   Melene Muller ON 12/23/2020] methylPREDNISolone  8 mg Oral QHS   Followed by   Melene Muller ON 12/26/2020] methylPREDNISolone  4 mg Oral QHS   methylPREDNISolone (SOLU-MEDROL) injection  48 mg Intravenous Once   metoCLOPramide (REGLAN) injection  5 mg Intravenous Q6H   mirtazapine  15 mg Per Tube QHS   ondansetron  4 mg Intravenous Q8H   pantoprazole (PROTONIX) IV  40 mg Intravenous Q12H   potassium & sodium phosphates  2 packet Per Tube Once   prenatal multivitamin  1 tablet Per Tube Q1200   scopolamine  1 patch Transdermal Q72H   sodium chloride flush  10-40 mL Intracatheter Q12H   sodium chloride flush  3 mL Intravenous Q12H   Continuous  Infusions:  sodium chloride     albumin human 25 g (12/19/20 0823)   feeding supplement (OSMOLITE 1.5 CAL) 50 mL/hr at 12/19/20 0540   insulin 2.2 Units/hr (12/19/20 0942)   lactated ringers 10 mL/hr at 12/18/20 1811   promethazine (PHENERGAN)25mg  in 4mL NS 25 mg (12/18/20 0306)   thiamine injection 500 mg (12/18/20 1500)   PRN Meds: sodium chloride, acetaminophen **OR** acetaminophen, dextrose, guaiFENesin-dextromethorphan, phenol, prochlorperazine, promethazine (PHENERGAN)25mg  in 48mL NS, sodium chloride flush, sodium chloride flush   Vital Signs    Vitals:   12/19/20 0645 12/19/20 0650 12/19/20 0813 12/19/20 0815  BP:   (!) 127/58   Pulse:   (!) 101   Resp:   18   Temp:   98 F (36.7 C)   TempSrc:   Oral   SpO2: 98% 98% 94% 93%  Weight:      Height:        Intake/Output Summary (Last 24 hours) at 12/19/2020 1112 Last data filed at 12/19/2020 0657 Gross per 24 hour  Intake 1502.32 ml  Output 925 ml  Net 577.32 ml   Last 3 Weights 12/19/2020 12/18/2020 12/17/2020  Weight (lbs) 150 lb 12 oz 160 lb 11.5 oz 125 lb 10.6 oz  Weight (kg) 68.38 kg 72.9  kg 57 kg      Telemetry    Sinus rhythm with rate of 90s  - Personally Reviewed  ECG    N/A this AM - Personally Reviewed  Physical Exam   GEN: Appears older than her age, chronic ill appearing, improved color today  Neck: No JVD Cardiac: RRR, mildly tachycardiac, no murmurs, rubs, or gallops.  Respiratory: Clear but diminished at base on auscultation bilaterally. On 2LNC GI: Soft, nontender, non-distended. DHT in place, feeding infusing MS: BLE 1+ edema; No deformity. Neuro:  Alert and oriented x3, quiet speech, follow commands  Psych: Normal affect  LUE PICC in place, TPN infusing   Labs    High Sensitivity Troponin:   Recent Labs  Lab 11/21/20 1937 11/21/20 2136  TROPONINIHS 9 8      Chemistry Recent Labs  Lab 12/17/20 0049 12/17/20 1600 12/18/20 0406 12/19/20 0313  NA 138 137 137 138  K 3.6 3.5 3.6  3.9  CL 105 104 99 104  CO2 25 28 30 29   GLUCOSE 207* 167* 122* 129*  BUN <5* 8 13 20   CREATININE 1.03* 0.49 1.06* 1.12*  CALCIUM 7.9* 8.1* 8.4* 7.9*  PROT 4.9*  --  5.0* 5.2*  ALBUMIN 2.6*  --  2.7* 2.7*  AST 16  --  15 17  ALT 10  --  8 9  ALKPHOS 72  --  67 59  BILITOT 1.0  --  1.4* 1.0  GFRNONAA >60 >60 >60 >60  ANIONGAP 8 5 8 5      Hematology Recent Labs  Lab 12/17/20 0049 12/17/20 1600 12/18/20 0406  WBC 6.1 5.7 5.1  RBC 2.48*  2.48* 2.35* 2.82*  HGB 7.5* 7.0* 8.1*  HCT 22.3* 21.2* 24.9*  MCV 89.9 90.2 88.3  MCH 30.2 29.8 28.7  MCHC 33.6 33.0 32.5  RDW 13.5 13.4 15.4  PLT 177 198 186    BNP Recent Labs  Lab 12/17/20 0049  BNP 2,128.9*     DDimer No results for input(s): DDIMER in the last 168 hours.   Radiology    DG Abd Portable 1V  Result Date: 12/17/2020 CLINICAL DATA:  Feeding tube placement EXAM: PORTABLE ABDOMEN - 1 VIEW COMPARISON:  None. FINDINGS: Feeding tube is transpyloric with the tip in the distal duodenum near the ligament of Treitz. IMPRESSION: Feeding tube tip in the distal duodenum. Electronically Signed   By: 12/20/20 M.D.   On: 12/17/2020 15:25    Cardiac Studies   Echocardiogram 12/16/20: 1. Left ventricular ejection fraction, by estimation, is 40 to 45%. The  left ventricle has mildly decreased function. The left ventricle  demonstrates global hypokinesis. Left ventricular diastolic parameters are  indeterminate.   2. Right ventricular systolic function is normal. The right ventricular  size is mildly enlarged. There is normal pulmonary artery systolic  pressure. The estimated right ventricular systolic pressure is 29.2 mmHg.   3. Right atrial size was mildly dilated.   4. A small pericardial effusion is present.   5. The mitral valve is normal in structure. Mild to moderate mitral valve  regurgitation.   6. The aortic valve is tricuspid. Aortic valve regurgitation is not  visualized. No aortic stenosis is present.   7.  The inferior vena cava is normal in size with greater than 50%  respiratory variability, suggesting right atrial pressure of 3 mmHg.   Echocardiogram 11/25/20: INTERPRETATION ---------------------------------------------------------------    MILD LV DYSFUNCTION (See above) - EF 45%   NORMAL LA PRESSURES WITH NORMAL  DIASTOLIC FUNCTION    NORMAL RIGHT VENTRICULAR SYSTOLIC FUNCTION    VALVULAR REGURGITATION: TRIVIAL AR, MILD MR, MILD PR, TRIVIAL TR    NO VALVULAR STENOSIS    SMALL PERICARDIAL EFFUSION (See above)    MILD TO MODERATE MR  Patient Profile     EDWARDINE DESCHEPPER is a 30 y.o. female with a PMH of recently diagnosed chronic combined CHF, DM type 1, anemia, and currently pregnant at 25w gestation, cardiology is following since 12/17/2020 for the evaluation of CHF.    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  - BNP elevated to 2100, up from 1100 11/24/20.  - CXR with pulmonary edema and emerging pleural effusions today.  - Echo 12/16/20 with EF 40-45% (45% 11/25/20 at [facility]).   - Etiology: Too early for peripartum cardiomyopathy.  Query component of Beri- beri, hypoalbuminemia is contributing to edema. Unlikely her viral infection earlier this month contributed to her cardiomyopathy as EF was already 45% at that time. TSH is wnl. Unlikely low grade tachycardia at 100s has contributed. No atherosclerosis seen on CT chest, no ischemic ECG changes , negative Hs trop, and no angina symptoms to suggest obstructive CAD. No symptoms c/f OSA. She has been diuresing with IV lasix 40mg  TID with UOP net -2.0L since arrival to [facility].  - UOP 750 ML over the past 24 hours, Net - 3.8 L since admission, wegitht is 160 >150 ibs - Please monitor strict I&Os and daily weights - Renal index uptrending today, Cr 1.09 POA,  1.12 today,  unable transition to PO Lasix at this time due to  hyperemesis, continue IV Lasix 40mg  daily  - GDMT: medication use is limited by pregnancy, labetalol is not indicated for CHF, OK continue hydralazine 10mg  TID via feeding tube, not candidate for ARNI/ACEI/ARB/MRA  HTN - BP improved control, on hydralazine 10mg  TID via DT tube, may need up-titration if needed, coordinate meds change with OB    DM type 1 - Diagnosed at age 26. C/b gastroparesis. Follows with endocrine outpatient with documented brittle diabetes. A1C 5.8 11/21/20. Continue management per primary team. May consider tx for gastroparesis if OK per GYN   Hyperemesis gravida at 25w gestation Hypoalbuminemia Severe protein calorie malnutrition  Chromic normocytic anemia  - albumin has been 1.5-2.7 ranges since June 2022 , pre-albumin 5.8 on 12/17/20  - anemia with Hgb 8 ranges - poorly controlled hyperemesis gravida required multiple hospitalization  - on albumin infusion and DHT tube feeds currently, agree with IV thiamine as TPN has been stopped - Continue management per primary team and OB    For questions or updates, please contact [facility] Please consult [email or web address] for contact info under        Signed, 5, NP  12/19/2020, 11:12 AM    I have seen and examined the patient along with July 2022, NP .  I have reviewed the chart, notes and new data.  I agree with PA/NP's note.  Key new complaints: She feels and looks better today, although she still has nausea and vomiting. Key examination changes: Diminished breath sounds in both bases, but no wet rales are heard, regular rate and rhythm a little less tachycardic Key new findings / data: No significant changes in metabolic parameters.  Hemoglobin up to 8.1.  PLAN: Continue diuretics once daily.  Hopefully we can switch to oral diuretics if her GI distress  improves. Unfortunately, we are severely limited in applying medical therapy for LV systolic dysfunction in the setting of pregnancy and intolerance to  p.o. medications.  If she does become able to take oral medications, may benefit from a low-dose hydralazine/nitrates regimen. The mechanism of left ventricular dysfunction remains unclear.  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  Nutritional deficiencies considered, but this should improve now that she has received TPN and is receiving tube feeds.  There is a possibility that she may have coronary disease due to longstanding type I diabetes mellitus, but this would still seem to be very unusual at her age, especially in the absence of angina and absence of ECG changes and complete absence of visible calcified atherosclerotic plaque in the coronaries or thoracic aorta on CT. Will follow along over the weekend.  Thurmon Fair, MD, [facility] [phone] 12/19/2020, 12:15 PM

## 2020-12-19 NOTE — Progress Notes (Signed)
ANTICOAGULATION CONSULT NOTE - Follow Up Consult  Pharmacy Consult for Lovenox Indication: VTE treatment for right brachial DVT  No Known Allergies  Patient Measurements: Height: 5\' 1"  (154.9 cm) Weight: 68.4 kg (150 lb 12 oz) IBW/kg (Calculated) : 47.8 Heparin Dosing Weight: 70.2kg  Vital Signs: Temp: 98.2 F (36.8 C) (07/29 1603) Temp Source: Oral (07/29 1603) BP: 134/61 (07/29 1603) Pulse Rate: 102 (07/29 1603)  Labs: Recent Labs    12/17/20 0049 12/17/20 1600 12/18/20 0406 12/19/20 0313 12/19/20 1520  HGB 7.5* 7.0* 8.1*  --   --   HCT 22.3* 21.2* 24.9*  --   --   PLT 177 198 186  --   --   HEPRLOWMOCWT  --   --   --   --  0.76  CREATININE 1.03* 0.49 1.06* 1.12*  --   CKTOTAL 54  55  --   --   --   --     Estimated Creatinine Clearance: 65.5 mL/min (A) (by C-G formula based on SCr of 1.12 mg/dL (H)).     Assessment: 30yo F at [redacted]w[redacted]d gestation with complex presentation. Pt on treatment dose lovenox for recent right brachial DVT. Anti-Xa level drawn today at 1520 (~ 5 hour level) = 0.76 which is within target range of 0.6-1. Will continue current regimen of Lovenox 70mg  sq q12h. Goal of Therapy:  Anti-Xa level 0.6-1 units/ml 4hrs after LMWH dose given Monitor platelets by anticoagulation protocol: Yes   Plan:  Continue Lovenox 70mg  sq q12h.  Daily CBC Will continue to monitor for s/s bleeding or complications.  Thanks!  [redacted]w[redacted]d 12/19/2020,5:36 PM

## 2020-12-19 NOTE — Progress Notes (Signed)
Nutrition Follow-up  DOCUMENTATION CODES:  Not applicable  INTERVENTION:   Osmolite 1.5 at  goal rate of 50 ml/hr.  45 ml Prosource TF BID.    Tube feeding regimen provides 1880 kcal (100% of needs for repletion), 97 grams of protein, and 914    ml of H2O.    Additional IVF of 0.9% sodium chloride at 10 ml/hr Total fluids/ day ordered 1240 ml, < est needs of 1800 ml/day. May need supplemental fluids  Additional supplements: PHOS-NAK PNV q day Thiamine 500  mg IV q day Iron 60 mg q day Folic acid 1 mg q day  Undergoing Medrol taper  At time of pt interaction, pt c/o burning throat and is worried she has dislodged feeding tube Continues with some vomiting, reported to be baseline  NUTRITION DIAGNOSIS:  Inadequate oral intake related to nausea, vomiting as evidenced by NPO status. Ongoing  GOAL:  Patient will meet greater than or equal to 90% of their needs  MONITOR:   Diet advancement, Labs, Weight trends, TF tolerance, Skin, I & O's  REASON FOR ASSESSMENT:  Enteral support/ tubefeedings  ASSESSMENT:  30 y.o. female with medical history significant of [redacted]w[redacted]d on presentation, Estimated Date of Delivery: 03/30/21 by by 9 week ultrasound, Type 1 diabetic, Cardiomyopathy, chronic systolic. Multiple recent admissions for hyperemesis/ pneumonia ( [facility] )  7/27- cortrak placed- post-pyloric placement  Reviewed I/O's: +.577 x 24 hours  Lab Results  Component Value Date   HGBA1C 5.8 (H) 11/21/2020    Diet Order:   Diet Order     None      EDUCATION NEEDS:   No education needs have been identified at this time  Skin:  Skin Assessment: Reviewed RN Assessment  Last BM:  12/18/20  Height:   Ht Readings from Last 1 Encounters:  12/17/20 5\' 1"  (1.549 m)    Weight:   Wt Readings from Last 1 Encounters:  12/19/20 68.4 kg    Ideal Body Weight:  47.7 kg  BMI:  Body mass index is 28.48 kg/m.  Estimated Nutritional Needs:   Kcal:  1700-1900  Protein:   90-105 grams  Fluid:  > 1.7 L  12/21/20 M.M LDN Neonatal Nutrition Support Specialist/RD III

## 2020-12-19 NOTE — Care Management Note (Signed)
Case Management Note  Patient Details  Name: Ashley Camacho MRN: [id number] Date of Birth: April 15, 1991  Subjective/Objective:                   Ashley Camacho is a 30 y.o. female with medical history significant of [redacted]w[redacted]d on presentation, Estimated Date of Delivery: 03/30/21 by by 9 week ultrasound, Type 1 diabetic, Cardiomyopathy, chronic systolic heart failure who was admitted to [facility] service initially for hyperemesis gravidarum x 2 days.  DVT in bilateral arm   In-House Referral:  Dietician; Pharmacy; PT; OT;Diabetes Coordinator  Additional Comments: CM spoke to RN and patient briefly and verified demographics.  She informed CM that she lives family and does not work.  She does not currently have a PCP but has a endocrinologist in [place] - [facility]- and that she has been a diabetic since the age of 36. Patient is interested and would like a PCP in the [place] area.   If patient does need any HH services patient does not have preference of agencies she informed CM.  CM messaged triad hospitalist and also OBGYN MD and at this time to early to tell what discharge needs patient will have. [facility] department will continue to follow for discharge needs regarding DME equipment and Home Health, PCP appointment.   CM notified Pam # [phone] with [facility] of potential home health needs and she is going to follow patient while inpatient.  Gretchen Short RNC-MNN, BSN Transitions of Care [facility]    Emilio Math [place], [place] 12/19/2020, 9:39 PM

## 2020-12-19 NOTE — Progress Notes (Signed)
PROGRESS NOTE    Ashley Camacho  ESP:[id number] DOB: 09-26-1990 DOA: 12/16/2020 PCP: Hildred Laser, MD  [id number]   Assessment & Plan:   Active Problems:   Anemia affecting pregnancy in second trimester   AKI (acute kidney injury) (HCC)   Hyperemesis   Pregnancy with 24 completed weeks gestation   DKA, type 1, not at goal [facility])   Starvation ketoacidosis   Acute deep vein thrombosis (DVT) of brachial vein of right upper extremity (HCC)   Acute respiratory failure with hypoxia (HCC)   Vitreous hemorrhage of right eye (HCC)   Gastroparesis due to DM (HCC)   Diabetic retinopathy (HCC)   Acute on chronic combined systolic and diastolic CHF (congestive heart failure) (HCC)   Patient is a 30 year old female G2,P1 at [redacted] weeks gestation who has a history of type 1 diabetes mellitus.  She presented to the emergency room for evaluation of persistent nausea and vomiting .  She was recently with multiple hospitalization, at [facility] for sepsis secondary to pneumonia and was there for 2 weeks.  Couple hospitalization at [facility] due to intractable nausea and vomiting. -Transferred from [facility] to [facility] 7/26 given her severe malnutrition, with hyperemesis gravidarum, for consideration of cortrak tube insertion.   Severe protein calorie malnutrition/hyperemesis gravidarum -Patient unable to have an oral intake for some time now, she is started on TPN, core track tube was inserted 7/27, she is started on tube feed managed by nutritionist, so far tolerating 50 cc/h. -She remains with significant nausea and vomiting, but this is at her baseline over her last few weeks, does not appear to be worsening after starting her tube feed. . - she is with known history of brittle diabetes, her A1c was 5.8 11/21/2020 with known history of diabetes with polyneuropathy and gastroparesis -Added Reglan with underlying history of gastroparesis. -We will do short steroid trial to see if it is helping  with her symptom  per hyperemesis gravidarum protocol . -will discontinue TPN as so far she is able to keep her tube feeds. -She did receive albumin. -We will monitor closely for refeeding syndrome.  Phosphorus, magnesium, BMP daily. -Continue with TPN till her tube feed intake is reliable -She is to receive some albumin -Continue Remeron  Type 1 diabetes mellitus insulin-dependent, uncontrolled with hypoglycemia. -Given patient oral intake is unreliable, and her CBG level fluctuating significantly, she will be kept on insulin drip for now.  Hyperemesis gravidarum -See above discussion  Cardiomyopathy/chronic systolic heart failure 2D echocardiogram shows an LVEF of 45% -With lower extremity edema, likely combination of both CHF exacerbation and third spacing from hypoalbuminemia -Giving IV albumin -Continue with IV diuresis - cardiology  input greatly appreciated, currently on hydralazine for blood pressure control -Started on IV thiamine   History of right brachial DVT -Continue with full dose anticoagulation Lovenox   Hypomagnesemia -Repleted  Normocytic anemia -In the setting of pregnancy, she was transfused 1 unit PRBC on 7/27.  Patient admitted with Foley catheter, which was discontinued 7/27 Did order topical antibiotic for site of right foot small wound due to sunburn.   DVT prophylaxis: [id number] dose lovenox Code Status: Full code  Family Communication: Tried to call partner Mr. Sondra Come, left him a voicemail. Level of care: Antepartum Dispo:   The patient is from: transfer from [facility]  Remains inpatient appropriate because:Inpatient level of care appropriate due to severity of illness   Dispo: The patient is from: [facility]  Anticipated d/c is to: Home              Patient currently is not medically stable to d/c.              Difficult to place patient No   Subjective :  Still complaining of nausea and vomiting, with no significant change, she does  report diarrhea.   Objective: Vitals:   12/19/20 1130 12/19/20 1134 12/19/20 1135 12/19/20 1140  BP:  (!) 157/96    Pulse:  (!) 103    Resp:  20    Temp:  97.9 F (36.6 C)    TempSrc:  Oral    SpO2: 92%  93% 92%  Weight:      Height:        Intake/Output Summary (Last 24 hours) at 12/19/2020 1215 Last data filed at 12/19/2020 1115 Gross per 24 hour  Intake 1502.32 ml  Output 926 ml  Net 576.32 ml   Filed Weights   12/17/20 1600 12/18/20 0422 12/19/20 0436  Weight: 57 kg 72.9 kg 68.4 kg     Examination:   Awake Alert, Oriented X 3, ill-appearing, frail and deconditioned  Symmetrical Chest wall movement, Good air movement bilaterally, diminished air  entry at the bases RRR,No Gallops,Rubs or new Murmurs, No Parasternal Heave +ve B.Sounds, Abd Soft, No tenderness, No rebound - guarding or rigidity. No Cyanosis, Clubbing , +1 edema, No new Rash or bruise        Data Reviewed: I have personally reviewed following labs and imaging studies  CBC: Recent Labs  Lab 12/12/20 2111 12/13/20 0811 12/15/20 0536 12/17/20 0049 12/17/20 1600 12/18/20 0406  WBC 7.2 8.8 7.5 6.1 5.7 5.1  NEUTROABS 4.9 8.1* 5.5 4.3  --   --   HGB 9.9* 9.5* 8.6* 7.5* 7.0* 8.1*  HCT 28.9* 27.8* 25.4* 22.3* 21.2* 24.9*  MCV 89.5 90.0 90.1 89.9 90.2 88.3  PLT 171 177 168 177 198 186   Basic Metabolic Panel: Recent Labs  Lab 12/15/20 0536 12/15/20 2328 12/16/20 0430 12/17/20 0049 12/17/20 1600 12/18/20 0406 12/19/20 0313  NA 139  --  139 138 137 137 138  K 3.4* 3.5 3.5 3.6 3.5 3.6 3.9  CL 113*  --  109 105 104 99 104  CO2 22  --  25 25 28 30 29   GLUCOSE 176*  --  143* 207* 167* 122* 129*  BUN <5*  --  <5* <5* 8 13 20   CREATININE 1.06*  --  1.09* 1.03* 0.49 1.06* 1.12*  CALCIUM 7.4*  --  7.6* 7.9* 8.1* 8.4* 7.9*  MG 1.4* 1.7 1.7 1.4*  --  2.0 2.0  PHOS 1.9*  --  3.2 3.7  --  4.2 2.8   GFR: Estimated Creatinine Clearance: 65.5 mL/min (A) (by C-G formula based on SCr of 1.12 mg/dL  (H)). Liver Function Tests: Recent Labs  Lab 12/13/20 0811 12/15/20 0536 12/17/20 0049 12/18/20 0406 12/19/20 0313  AST 32 25 16 15 17   ALT 15 12 10 8 9   ALKPHOS 102 78 72 67 59  BILITOT 3.3* 1.4* 1.0 1.4* 1.0  PROT 5.2* 4.2* 4.9* 5.0* 5.2*  ALBUMIN 1.8* 1.5* 2.6* 2.7* 2.7*   No results for input(s): LIPASE, AMYLASE in the last 168 hours. No results for input(s): AMMONIA in the last 168 hours. Coagulation Profile: No results for input(s): INR, PROTIME in the last 168 hours. Cardiac Enzymes: Recent Labs  Lab 12/17/20 0049  CKTOTAL 54  55   BNP (last  3 results) No results for input(s): PROBNP in the last 8760 hours. HbA1C: No results for input(s): HGBA1C in the last 72 hours. CBG: Recent Labs  Lab 12/19/20 0648 12/19/20 0812 12/19/20 0937 12/19/20 1041 12/19/20 1137  GLUCAP 115* 140* 121* 114* 110*   Lipid Profile: No results for input(s): CHOL, HDL, LDLCALC, TRIG, CHOLHDL, LDLDIRECT in the last 72 hours.  Thyroid Function Tests: Recent Labs    12/17/20 0049  TSH 3.907   Anemia Panel: Recent Labs    12/17/20 0049  VITAMINB12 1,146*  FOLATE 19.8  FERRITIN 681*  TIBC 125*  IRON 50  RETICCTPCT 1.9   Sepsis Labs: No results for input(s): PROCALCITON, LATICACIDVEN in the last 168 hours.  Recent Results (from the past 240 hour(s))  Resp Panel by RT-PCR (Flu A&B, Covid) Nasopharyngeal Swab     Status: None   Collection Time: 12/13/20  1:48 AM   Specimen: Nasopharyngeal Swab; Nasopharyngeal(NP) swabs in vial transport medium  Result Value Ref Range Status   SARS Coronavirus 2 by RT PCR NEGATIVE NEGATIVE Final    Comment: (NOTE) SARS-CoV-2 target nucleic acids are NOT DETECTED.  The SARS-CoV-2 RNA is generally detectable in upper respiratory specimens during the acute phase of infection. The lowest concentration of SARS-CoV-2 viral copies this assay can detect is 138 copies/mL. A negative result does not preclude SARS-Cov-2 infection and should not be  used as the sole basis for treatment or other patient management decisions. A negative result may occur with  improper specimen collection/handling, submission of specimen other than nasopharyngeal swab, presence of viral mutation(s) within the areas targeted by this assay, and inadequate number of viral copies(<138 copies/mL). A negative result must be combined with clinical observations, patient history, and epidemiological information. The expected result is Negative.  Fact Sheet for Patients:  BloggerCourse.com  Fact Sheet for Healthcare Providers:  SeriousBroker.it  This test is no t yet approved or cleared by the [place] FDA and  has been authorized for detection and/or diagnosis of SARS-CoV-2 by FDA under an Emergency Use Authorization (EUA). This EUA will remain  in effect (meaning this test can be used) for the duration of the COVID-19 declaration under Section 564(b)(1) of the Act, 21 U.S.C.section 360bbb-3(b)(1), unless the authorization is terminated  or revoked sooner.       Influenza A by PCR NEGATIVE NEGATIVE Final   Influenza B by PCR NEGATIVE NEGATIVE Final    Comment: (NOTE) The Xpert Xpress SARS-CoV-2/FLU/RSV plus assay is intended as an aid in the diagnosis of influenza from Nasopharyngeal swab specimens and should not be used as a sole basis for treatment. Nasal washings and aspirates are unacceptable for Xpert Xpress SARS-CoV-2/FLU/RSV testing.  Fact Sheet for Patients: BloggerCourse.com  Fact Sheet for Healthcare Providers: SeriousBroker.it  This test is not yet approved or cleared by the [place] FDA and has been authorized for detection and/or diagnosis of SARS-CoV-2 by FDA under an Emergency Use Authorization (EUA). This EUA will remain in effect (meaning this test can be used) for the duration of the COVID-19 declaration under Section  564(b)(1) of the Act, 21 U.S.C. section 360bbb-3(b)(1), unless the authorization is terminated or revoked.  Performed at [facility], [address] [place], [place] [address]   MRSA Next Gen by PCR, Nasal     Status: None   Collection Time: 12/15/20  9:20 PM   Specimen: Nasal Mucosa; Nasal Swab  Result Value Ref Range Status   MRSA by PCR Next Gen NOT DETECTED NOT  DETECTED Final    Comment: (NOTE) The GeneXpert MRSA Assay (FDA approved for NASAL specimens only), is one component of a comprehensive MRSA colonization surveillance program. It is not intended to diagnose MRSA infection nor to guide or monitor treatment for MRSA infections. Test performance is not FDA approved in patients less than 63 years old. Performed at [facility], [address], [place], [place] [address]       Radiology Studies: DG Abd Portable 1V  Result Date: 12/17/2020 CLINICAL DATA:  Feeding tube placement EXAM: PORTABLE ABDOMEN - 1 VIEW COMPARISON:  None. FINDINGS: Feeding tube is transpyloric with the tip in the distal duodenum near the ligament of Treitz. IMPRESSION: Feeding tube tip in the distal duodenum. Electronically Signed   By: Charlett Nose M.D.   On: 12/17/2020 15:25     Scheduled Meds:  bacitracin   Topical BID   Chlorhexidine Gluconate Cloth  6 each Topical Daily   enoxaparin  70 mg Subcutaneous Q12H   feeding supplement (PROSource TF)  45 mL Per Tube BID   ferrous sulfate  300 mg Per Tube Q breakfast   folic acid  1 mg Intravenous Daily   furosemide  40 mg Intravenous Daily   hydrALAZINE  10 mg Per Tube Q8H   mouth rinse  15 mL Mouth Rinse q12n4p   [START ON 12/20/2020] methylPREDNISolone  16 mg Oral Q breakfast   Followed by   Melene Muller ON 12/24/2020] methylPREDNISolone  8 mg Oral Q breakfast   Followed by   Melene Muller ON 12/31/2020] methylPREDNISolone  4 mg Oral Q breakfast   [START ON 12/20/2020] methylPREDNISolone  16 mg Oral Q1400   Followed by   Melene Muller ON  12/22/2020] methylPREDNISolone  8 mg Oral Q1400   Followed by   Melene Muller ON 12/25/2020] methylPREDNISolone  4 mg Oral Q1400   [START ON 12/20/2020] methylPREDNISolone  16 mg Oral QHS   Followed by   Melene Muller ON 12/23/2020] methylPREDNISolone  8 mg Oral QHS   Followed by   Melene Muller ON 12/26/2020] methylPREDNISolone  4 mg Oral QHS   methylPREDNISolone (SOLU-MEDROL) injection  48 mg Intravenous Once   metoCLOPramide (REGLAN) injection  5 mg Intravenous Q6H   mirtazapine  15 mg Per Tube QHS   ondansetron  4 mg Intravenous Q8H   pantoprazole (PROTONIX) IV  40 mg Intravenous Q12H   prenatal multivitamin  1 tablet Per Tube Q1200   scopolamine  1 patch Transdermal Q72H   sodium chloride flush  10-40 mL Intracatheter Q12H   sodium chloride flush  3 mL Intravenous Q12H   Continuous Infusions:  sodium chloride     albumin human 25 g (12/19/20 0823)   feeding supplement (OSMOLITE 1.5 CAL) 50 mL/hr at 12/19/20 0540   insulin 2 Units/hr (12/19/20 1145)   lactated ringers 10 mL/hr at 12/18/20 1811   promethazine (PHENERGAN)25mg  in 59mL NS 25 mg (12/18/20 0306)   thiamine injection 500 mg (12/18/20 1500)     LOS: 3 days     Huey Bienenstock, MD [facility] If 7PM-7AM, please contact night-coverage 12/19/2020, 12:15 PM

## 2020-12-20 ENCOUNTER — Inpatient Hospital Stay (HOSPITAL_COMMUNITY): Payer: Medicaid Other

## 2020-12-20 DIAGNOSIS — O210 Mild hyperemesis gravidarum: Secondary | ICD-10-CM

## 2020-12-20 DIAGNOSIS — O402XX0 Polyhydramnios, second trimester, not applicable or unspecified: Secondary | ICD-10-CM

## 2020-12-20 DIAGNOSIS — O24013 Pre-existing diabetes mellitus, type 1, in pregnancy, third trimester: Secondary | ICD-10-CM | POA: Insufficient documentation

## 2020-12-20 DIAGNOSIS — E1065 Type 1 diabetes mellitus with hyperglycemia: Secondary | ICD-10-CM

## 2020-12-20 DIAGNOSIS — I82401 Acute embolism and thrombosis of unspecified deep veins of right lower extremity: Secondary | ICD-10-CM

## 2020-12-20 DIAGNOSIS — O24012 Pre-existing diabetes mellitus, type 1, in pregnancy, second trimester: Secondary | ICD-10-CM

## 2020-12-20 DIAGNOSIS — E46 Unspecified protein-calorie malnutrition: Secondary | ICD-10-CM

## 2020-12-20 DIAGNOSIS — I5020 Unspecified systolic (congestive) heart failure: Secondary | ICD-10-CM

## 2020-12-20 DIAGNOSIS — O0992 Supervision of high risk pregnancy, unspecified, second trimester: Secondary | ICD-10-CM

## 2020-12-20 DIAGNOSIS — O2492 Unspecified diabetes mellitus in childbirth: Secondary | ICD-10-CM

## 2020-12-20 LAB — CBC
HCT: 22.2 % — ABNORMAL LOW (ref 36.0–46.0)
Hemoglobin: 7.1 g/dL — ABNORMAL LOW (ref 12.0–15.0)
MCH: 28.7 pg (ref 26.0–34.0)
MCHC: 32 g/dL (ref 30.0–36.0)
MCV: 89.9 fL (ref 80.0–100.0)
Platelets: 222 10*3/uL (ref 150–400)
RBC: 2.47 MIL/uL — ABNORMAL LOW (ref 3.87–5.11)
RDW: 15.2 % (ref 11.5–15.5)
WBC: 5.9 10*3/uL (ref 4.0–10.5)
nRBC: 0 % (ref 0.0–0.2)

## 2020-12-20 LAB — COMPREHENSIVE METABOLIC PANEL
ALT: 11 U/L (ref 0–44)
AST: 23 U/L (ref 15–41)
Albumin: 2.8 g/dL — ABNORMAL LOW (ref 3.5–5.0)
Alkaline Phosphatase: 71 U/L (ref 38–126)
Anion gap: 7 (ref 5–15)
BUN: 24 mg/dL — ABNORMAL HIGH (ref 6–20)
CO2: 29 mmol/L (ref 22–32)
Calcium: 7.6 mg/dL — ABNORMAL LOW (ref 8.9–10.3)
Chloride: 103 mmol/L (ref 98–111)
Creatinine, Ser: 1.16 mg/dL — ABNORMAL HIGH (ref 0.44–1.00)
GFR, Estimated: >60 mL/min (ref >60)
Glucose, Bld: 122 mg/dL — ABNORMAL HIGH (ref 70–99)
Potassium: 3.9 mmol/L (ref 3.5–5.1)
Sodium: 139 mmol/L (ref 135–145)
Total Bilirubin: 0.7 mg/dL (ref 0.3–1.2)
Total Protein: 5.2 g/dL — ABNORMAL LOW (ref 6.5–8.1)

## 2020-12-20 LAB — URINALYSIS, ROUTINE W REFLEX MICROSCOPIC
Bilirubin Urine: NEGATIVE
Glucose, UA: NEGATIVE mg/dL
Hgb urine dipstick: NEGATIVE
Ketones, ur: NEGATIVE mg/dL
Leukocytes,Ua: NEGATIVE
Nitrite: NEGATIVE
Protein, ur: 100 mg/dL — AB
Specific Gravity, Urine: 1.016 (ref 1.005–1.030)
pH: 7 (ref 5.0–8.0)

## 2020-12-20 LAB — GLUCOSE, CAPILLARY
Glucose-Capillary: 112 mg/dL — ABNORMAL HIGH (ref 70–99)
Glucose-Capillary: 102 mg/dL — ABNORMAL HIGH (ref 70–99)
Glucose-Capillary: 122 mg/dL — ABNORMAL HIGH (ref 70–99)
Glucose-Capillary: 101 mg/dL — ABNORMAL HIGH (ref 70–99)
Glucose-Capillary: 112 mg/dL — ABNORMAL HIGH (ref 70–99)
Glucose-Capillary: 109 mg/dL — ABNORMAL HIGH (ref 70–99)
Glucose-Capillary: 97 mg/dL (ref 70–99)
Glucose-Capillary: 154 mg/dL — ABNORMAL HIGH (ref 70–99)
Glucose-Capillary: 100 mg/dL — ABNORMAL HIGH (ref 70–99)
Glucose-Capillary: 115 mg/dL — ABNORMAL HIGH (ref 70–99)
Glucose-Capillary: 125 mg/dL — ABNORMAL HIGH (ref 70–99)
Glucose-Capillary: 91 mg/dL (ref 70–99)
Glucose-Capillary: 126 mg/dL — ABNORMAL HIGH (ref 70–99)
Glucose-Capillary: 136 mg/dL — ABNORMAL HIGH (ref 70–99)
Glucose-Capillary: 128 mg/dL — ABNORMAL HIGH (ref 70–99)
Glucose-Capillary: 112 mg/dL — ABNORMAL HIGH (ref 70–99)
Glucose-Capillary: 108 mg/dL — ABNORMAL HIGH (ref 70–99)
Glucose-Capillary: 117 mg/dL — ABNORMAL HIGH (ref 70–99)

## 2020-12-20 LAB — WET PREP, GENITAL
Clue Cells Wet Prep HPF POC: NONE SEEN
Sperm: NONE SEEN
Trich, Wet Prep: NONE SEEN
WBC, Wet Prep HPF POC: NONE SEEN
Yeast Wet Prep HPF POC: NONE SEEN

## 2020-12-20 LAB — PROTEIN / CREATININE RATIO, URINE
Creatinine, Urine: 28.71 mg/dL
Protein Creatinine Ratio: 1.43 mg/mg{Cre} — ABNORMAL HIGH (ref 0.00–0.15)
Total Protein, Urine: 41 mg/dL

## 2020-12-20 LAB — MAGNESIUM: Magnesium: 2 mg/dL (ref 1.7–2.4)

## 2020-12-20 LAB — PREPARE RBC (CROSSMATCH)

## 2020-12-20 LAB — PHOSPHORUS: Phosphorus: 3 mg/dL (ref 2.5–4.6)

## 2020-12-20 MED ORDER — POTASSIUM CHLORIDE 10 MEQ/100ML IV SOLN
10.0000 meq | Freq: Once | INTRAVENOUS | Status: AC
  Administered 2020-12-20: 10 meq via INTRAVENOUS
  Filled 2020-12-20: qty 100

## 2020-12-20 MED ORDER — ENOXAPARIN SODIUM 80 MG/0.8ML IJ SOSY
70.0000 mg | PREFILLED_SYRINGE | Freq: Two times a day (BID) | INTRAMUSCULAR | Status: DC
  Administered 2020-12-21 – 2021-01-10 (×42): 70 mg via SUBCUTANEOUS
  Filled 2020-12-20 (×42): qty 0.8

## 2020-12-20 MED ORDER — SODIUM CHLORIDE 0.9% IV SOLUTION: Freq: Once | INTRAVENOUS | Status: AC

## 2020-12-20 NOTE — Progress Notes (Signed)
Bladder scan was redone this morning. Amount noted was 327cc. Hospitalist notified. Patient still states "I feel the urge to pee but I cannot go". Hospitalist gave orders for a straight cath, urinalysis, and urine culture. Raelyn Ensign, RN

## 2020-12-20 NOTE — Progress Notes (Signed)
Progress Note  Patient Name: Ashley Camacho Date of Encounter: 12/20/2020  [facility] Cardiologist: New to Dr. Royann Shivers  Subjective   Notes improved SOB, notes persistent fatigue and weakness.  Asymptomatic from tachycardia.  Inpatient Medications    Scheduled Meds:  bacitracin   Topical BID   Chlorhexidine Gluconate Cloth  6 each Topical Daily   [START ON 12/21/2020] enoxaparin  70 mg Subcutaneous Q12H   feeding supplement (PROSource TF)  45 mL Per Tube BID   ferrous sulfate  300 mg Per Tube Q breakfast   folic acid  1 mg Intravenous Daily   furosemide  40 mg Intravenous Daily   hydrALAZINE  10 mg Per Tube Q8H   mouth rinse  15 mL Mouth Rinse q12n4p   methylPREDNISolone  16 mg Oral Q breakfast   Followed by   Melene Muller ON 12/24/2020] methylPREDNISolone  8 mg Oral Q breakfast   Followed by   Melene Muller ON 12/31/2020] methylPREDNISolone  4 mg Oral Q breakfast   methylPREDNISolone  16 mg Oral Q1400   Followed by   Melene Muller ON 12/22/2020] methylPREDNISolone  8 mg Oral Q1400   Followed by   Melene Muller ON 12/25/2020] methylPREDNISolone  4 mg Oral Q1400   methylPREDNISolone  16 mg Oral QHS   Followed by   Melene Muller ON 12/23/2020] methylPREDNISolone  8 mg Oral QHS   Followed by   Melene Muller ON 12/26/2020] methylPREDNISolone  4 mg Oral QHS   metoCLOPramide (REGLAN) injection  5 mg Intravenous Q6H   pantoprazole (PROTONIX) IV  40 mg Intravenous Q12H   prenatal multivitamin  1 tablet Per Tube Q1200   scopolamine  1 patch Transdermal Q72H   sodium chloride flush  10-40 mL Intracatheter Q12H   sodium chloride flush  3 mL Intravenous Q12H   Continuous Infusions:  sodium chloride Stopped (12/19/20 1700)   feeding supplement (OSMOLITE 1.5 CAL) 50 mL/hr at 12/20/20 0300   insulin 2.2 Units/hr (12/20/20 0609)   lactated ringers 10 mL/hr at 12/18/20 1811   promethazine (PHENERGAN)25mg  in 32mL NS 25 mg (12/18/20 0306)   thiamine injection 500 mg (12/19/20 1443)   PRN Meds: sodium chloride, acetaminophen  **OR** acetaminophen, dextrose, guaiFENesin-dextromethorphan, loperamide, ondansetron, phenol, prochlorperazine, promethazine (PHENERGAN)25mg  in 29mL NS, sodium chloride flush, sodium chloride flush   Vital Signs    Vitals:   12/20/20 0750 12/20/20 1108 12/20/20 1119 12/20/20 1135  BP: 135/65 138/77 138/77 (!) 144/73  Pulse: (!) 103  (!) 106 (!) 104  Resp: 19  19 (!) 22  Temp: 98.1 F (36.7 C) 98 F (36.7 C) 98 F (36.7 C) 97.7 F (36.5 C)  TempSrc: Oral Oral Oral Axillary  SpO2: 98% 97% 97% 96%  Weight:      Height:        Intake/Output Summary (Last 24 hours) at 12/20/2020 1207 Last data filed at 12/20/2020 1134 Gross per 24 hour  Intake 3126.41 ml  Output 976 ml  Net 2150.41 ml   Last 3 Weights 12/20/2020 12/19/2020 12/18/2020  Weight (lbs) 149 lb 150 lb 12 oz 160 lb 11.5 oz  Weight (kg) 67.586 kg 68.38 kg 72.9 kg      Telemetry    SR to sinus tachycardia (predominant low level sinus tach)  - Personally Reviewed  ECG    None since 12/17/20- Personally Reviewed  Physical Exam   GEN: Appears older than her age, chronic ill appearing Neck: No JVD Cardiac: Regular tachycardia, no murmurs, rubs, or gallops.  Respiratory: Clear but diminished at base  on auscultation bilaterally. On 2LNC GI: Soft, nontender, non-distended. DHT in place, feeding infusing MS: BLE 1+ edema, no tender Neuro:  Alert and oriented x3, quiet speech, follow commands  Psych: Depressed affect   Labs    High Sensitivity Troponin:   Recent Labs  Lab 11/21/20 1937 11/21/20 2136  TROPONINIHS 9 8      Chemistry Recent Labs  Lab 12/18/20 0406 12/19/20 0313 12/20/20 0430  NA 137 138 139  K 3.6 3.9 3.9  CL 99 104 103  CO2 30 29 29   GLUCOSE 122* 129* 122*  BUN 13 20 24*  CREATININE 1.06* 1.12* 1.16*  CALCIUM 8.4* 7.9* 7.6*  PROT 5.0* 5.2* 5.2*  ALBUMIN 2.7* 2.7* 2.8*  AST 15 17 23   ALT 8 9 11   ALKPHOS 67 59 71  BILITOT 1.4* 1.0 0.7  GFRNONAA >60 >60 >60  ANIONGAP 8 5 7       Hematology Recent Labs  Lab 12/17/20 1600 12/18/20 0406 12/20/20 0430  WBC 5.7 5.1 5.9  RBC 2.35* 2.82* 2.47*  HGB 7.0* 8.1* 7.1*  HCT 21.2* 24.9* 22.2*  MCV 90.2 88.3 89.9  MCH 29.8 28.7 28.7  MCHC 33.0 32.5 32.0  RDW 13.4 15.4 15.2  PLT 198 186 222    BNP Recent Labs  Lab 12/17/20 0049  BNP 2,128.9*     DDimer No results for input(s): DDIMER in the last 168 hours.   Radiology    No results found.  Cardiac Studies   Echocardiogram 12/16/20: 1. Left ventricular ejection fraction, by estimation, is 40 to 45%. The  left ventricle has mildly decreased function. The left ventricle  demonstrates global hypokinesis. Left ventricular diastolic parameters are  indeterminate.   2. Right ventricular systolic function is normal. The right ventricular  size is mildly enlarged. There is normal pulmonary artery systolic  pressure. The estimated right ventricular systolic pressure is 29.2 mmHg.   3. Right atrial size was mildly dilated.   4. A small pericardial effusion is present.   5. The mitral valve is normal in structure. Mild to moderate mitral valve  regurgitation.   6. The aortic valve is tricuspid. Aortic valve regurgitation is not  visualized. No aortic stenosis is present.   7. The inferior vena cava is normal in size with greater than 50%  respiratory variability, suggesting right atrial pressure of 3 mmHg.   Echocardiogram 11/25/20: INTERPRETATION ---------------------------------------------------------------    MILD LV DYSFUNCTION (See above) - EF 45%   NORMAL LA PRESSURES WITH NORMAL DIASTOLIC FUNCTION    NORMAL RIGHT VENTRICULAR SYSTOLIC FUNCTION    VALVULAR REGURGITATION: TRIVIAL AR, MILD MR, MILD PR, TRIVIAL TR    NO VALVULAR STENOSIS    SMALL PERICARDIAL EFFUSION (See above)    MILD TO MODERATE MR  Patient Profile     Ashley Camacho is a 30 y.o. female with a PMH of recently diagnosed chronic combined CHF, DM type 1, anemia, and currently pregnant  at 25w gestation, cardiology is following since 12/17/2020 for the evaluation of CHF.    Assessment & Plan    Acute on chronic combined CHF HTN with DM Cardiomyopathy of unclear etiology - She has been struggling with hyperemesis gravida resulting in malnutrition.  - Echo 12/16/20 with EF 40-45% (45% 11/25/20 at [facility]).   - Etiology: unclear; nutritional deficiency is highest on DDX; soft plaque CAD would be atypical but possible in the setting of HTN and T1DM - Please monitor strict I&Os and daily weights -  Lasix initially 40  IV TID- > IV Lasix 40mg  daily; if renal function worsens and breathing improves will drop to 20 mg IV Daily until able to tolerate PO - GDMT use is limited by pregnancy - labetalol is not indicated for HF - Continue hydralazine 10mg  TID via feeding tube, if BP remains elevated will increase to 25 mg TID - Not candidate for ARNI/ACEI/ARB/MRA   Hyperemesis gravida at 25w gestation Hypoalbuminemia Severe protein calorie malnutrition  Chromic normocytic anemia  - albumin has been 1.5-2.7 ranges since June 2022 , pre-albumin 5.8 on 12/17/20  - anemia with Hgb 8 ranges - poorly controlled hyperemesis gravida required multiple hospitalization  - Continue management per primary team and OB    For questions or updates, please contact [facility] Please consult [email or web address] for contact info under        Signed, July 2022, MD  12/20/2020, 12:07 PM

## 2020-12-20 NOTE — Progress Notes (Signed)
I agree with the 0700 am assessment of EKG monitoring on tele box T-01 verified with Phylliss Bob, RN.

## 2020-12-20 NOTE — Progress Notes (Addendum)
Daily Antepartum Note  Admission Date: 12/16/2020 Current Date: 12/20/2020 7:43 AM  Ashley Camacho is a 30 y.o. G2P0101 @ [redacted]w[redacted]d, admitted from [facility] as IM service to IM direct transfer for [facility] FT placement.  Pregnancy complicated by: Patient Active Problem List   Diagnosis Date Noted   Acute deep vein thrombosis (DVT) of brachial vein of right upper extremity (HCC) 12/17/2020   Acute respiratory failure with hypoxia (HCC) 12/17/2020   Anxiety 12/17/2020   Diabetic retinopathy (HCC) 12/17/2020   Acute on chronic combined systolic and diastolic CHF (congestive heart failure) (HCC)    Starvation ketoacidosis 12/16/2020   Hyperemesis 12/13/2020   Pregnancy with 24 completed weeks gestation 12/13/2020   DKA, type 1, not at goal [facility]) 12/13/2020   Sunburn 12/13/2020   Hypothyroidism 12/10/2020   Malnutrition (HCC) 12/10/2020   Vitreous hemorrhage of right eye (HCC) 12/10/2020   Pleural effusion associated with pulmonary infection 11/25/2020   Pneumonia affecting pregnancy 11/24/2020   Diabetes mellitus affecting pregnancy, second trimester 11/24/2020   Edema 11/24/2020   Decreased urine output 11/24/2020   Shortness of breath    Zinc deficiency    SOB (shortness of breath)    Social problem 11/21/2020   Nausea and vomiting during pregnancy prior to [redacted] weeks gestation 11/21/2020   Low serum vitamin B12    Indication for care in labor and delivery, antepartum 11/20/2020   Anemia affecting pregnancy in second trimester    Absolute anemia    AKI (acute kidney injury) (HCC)    Nausea and vomiting during pregnancy 10/23/2020   History of premature delivery, currently pregnant, second trimester 10/13/2020   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   Depressive disorder 09/28/2018   Neuropathy 09/28/2018   Gastroparesis due to DM (HCC) 04/22/2016   Diabetic sensorimotor neuropathy (HCC) 04/19/2016   DKA (diabetic ketoacidoses) 04/03/2016   Microalbuminuria 09/26/2005    Overnight/24hr  events:  Urinary retention (see RN note) with pt s/p I/O cath  Subjective:  Patient sleepy, resting, no OB complaints  Objective:    Current Vital Signs 24h Vital Sign Ranges  T 98.1 F (36.7 C) Temp  Avg: 98 F (36.7 C)  Min: 97.9 F (36.6 C)  Max: 98.2 F (36.8 C)  BP 134/70 BP  Min: 127/58  Max: 157/96  HR 97 Pulse  Avg: 102.3  Min: 97  Max: 106  RR 20 Resp  Avg: 19.8  Min: 18  Max: 21  SaO2 98 % Nasal Cannula SpO2  Avg: 93.5 %  Min: 89 %  Max: 99 %       24 Hour I/O Current Shift I/O  Time Ins Outs 07/29 0701 - 07/30 0700 In: 2815.6 [I.V.:389.9] Out: 1077 [Urine:975] No intake/output data recorded.   Patient Vitals for the past 24 hrs:  BP Temp Temp src Pulse Resp SpO2 Weight  12/20/20 0602 -- -- -- -- -- -- 67.6 kg  12/20/20 0555 -- -- -- -- -- 98 % --  12/20/20 0500 -- -- -- -- -- 97 % --  12/20/20 0410 134/70 98.1 F (36.7 C) Oral 97 20 -- --  12/20/20 0400 -- -- -- -- -- 98 % --  12/20/20 0305 -- -- -- -- -- 98 % --  12/20/20 0200 -- -- -- -- -- 99 % --  12/20/20 0100 -- -- -- -- -- 98 % --  12/20/20 0000 -- -- -- -- -- 95 % --  12/19/20 2313 (!) 151/76 97.9 F (36.6 C) Oral (!) 106 20 -- --  12/19/20 2300 -- -- -- -- -- 94 % --  12/19/20 2100 -- -- -- -- -- 97 % --  12/19/20 2000 -- -- -- -- -- 96 % --  12/19/20 1930 139/66 97.9 F (36.6 C) Oral (!) 105 20 97 % --  12/19/20 1900 -- -- -- -- -- 97 % --  12/19/20 1715 -- -- -- -- -- 95 % --  12/19/20 1620 -- -- -- -- -- 90 % --  12/19/20 1615 -- -- -- -- -- 90 % --  12/19/20 1610 -- -- -- -- -- (!) 89 % --  12/19/20 1603 134/61 98.2 F (36.8 C) Oral (!) 102 (!) 21 93 % --  12/19/20 1400 -- -- -- -- -- (!) 89 % --  12/19/20 1355 -- -- -- -- -- 90 % --  12/19/20 1350 -- -- -- -- -- 91 % --  12/19/20 1345 -- -- -- -- -- 90 % --  12/19/20 1340 -- -- -- -- -- (!) 89 % --  12/19/20 1335 -- -- -- -- -- 91 % --  12/19/20 1330 -- -- -- -- -- (!) 89 % --  12/19/20 1325 -- -- -- -- -- (!) 89 % --  12/19/20 1320  -- -- -- -- -- 90 % --  12/19/20 1315 -- -- -- -- -- 91 % --  12/19/20 1300 -- -- -- -- -- 92 % --  12/19/20 1255 -- -- -- -- -- 92 % --  12/19/20 1250 -- -- -- -- -- 92 % --  12/19/20 1245 -- -- -- -- -- 91 % --  12/19/20 1240 -- -- -- -- -- 93 % --  12/19/20 1235 -- -- -- -- -- 92 % --  12/19/20 1225 -- -- -- -- -- 94 % --  12/19/20 1210 -- -- -- -- -- 93 % --  12/19/20 1205 -- -- -- -- -- 93 % --  12/19/20 1200 -- -- -- -- -- 92 % --  12/19/20 1140 -- -- -- -- -- 92 % --  12/19/20 1135 -- -- -- -- -- 93 % --  12/19/20 1134 (!) 157/96 97.9 F (36.6 C) Oral (!) 103 20 -- --  12/19/20 1130 -- -- -- -- -- 92 % --  12/19/20 1120 -- -- -- -- -- 93 % --  12/19/20 1115 -- -- -- -- -- 93 % --  12/19/20 1110 -- -- -- -- -- 92 % --  12/19/20 1105 -- -- -- -- -- 92 % --  12/19/20 1100 -- -- -- -- -- 92 % --  12/19/20 1055 -- -- -- -- -- 90 % --  12/19/20 1035 -- -- -- -- -- 98 % --  12/19/20 1030 -- -- -- -- -- 97 % --  12/19/20 1025 -- -- -- -- -- 97 % --  12/19/20 1020 -- -- -- -- -- 97 % --  12/19/20 1015 -- -- -- -- -- 94 % --  12/19/20 1011 -- -- -- -- -- 96 % --  12/19/20 1010 -- -- -- -- -- 96 % --  12/19/20 1005 -- -- -- -- -- 97 % --  12/19/20 1000 -- -- -- -- -- 93 % --  12/19/20 0955 -- -- -- -- -- 96 % --  12/19/20 0950 -- -- -- -- -- 97 % --  12/19/20 0945 -- -- -- -- -- 97 % --  12/19/20 0935 -- -- -- -- -- 94 % --  12/19/20 0925 -- -- -- -- -- 92 % --  12/19/20 0920 -- -- -- -- -- 91 % --  12/19/20 0915 -- -- -- -- -- 91 % --  12/19/20 0910 -- -- -- -- -- 92 % --  12/19/20 0905 -- -- -- -- -- 92 % --  12/19/20 0850 -- -- -- -- -- 94 % --  12/19/20 0845 -- -- -- -- -- 95 % --  12/19/20 0841 -- -- -- -- -- 94 % --  12/19/20 0840 -- -- -- -- -- 94 % --  12/19/20 0835 -- -- -- -- -- 93 % --  12/19/20 0830 -- -- -- -- -- 94 % --  12/19/20 0825 -- -- -- -- -- 94 % --  12/19/20 0820 -- -- -- -- -- 94 % --  12/19/20 0815 -- -- -- -- -- 93 % --  12/19/20 0813 (!) 127/58  98 F (36.7 C) Oral (!) 101 18 94 % --   Fetal Heart Tones: 150 baseline, +accels, no decel, min to mod variability Tocometry: quiet  Physical exam: General: Well nourished, well developed female in no acute distress. Abdomen: gravid nttp Respiratory: no respiratory distress  Medications: Current Facility-Administered Medications  Medication Dose Route Frequency Provider Last Rate Last Admin   0.9 %  sodium chloride infusion  250 mL Intravenous PRN Warden FillersBass, Lawrence A, MD   Stopped at 12/19/20 1700   acetaminophen (TYLENOL) tablet 650 mg  650 mg Per Tube Q6H PRN Warden FillersBass, Lawrence A, MD       Or   acetaminophen (TYLENOL) suppository 650 mg  650 mg Rectal Q6H PRN Warden FillersBass, Lawrence A, MD       bacitracin ointment   Topical BID Elgergawy, Leana Roeawood S, MD   Given at 12/19/20 2239   Chlorhexidine Gluconate Cloth 2 % PADS 6 each  6 each Topical Daily Warden FillersBass, Lawrence A, MD   6 each at 12/19/20 1152   dextrose 50 % solution 0-50 mL  0-50 mL Intravenous PRN Warden FillersBass, Lawrence A, MD       enoxaparin (LOVENOX) injection 70 mg  70 mg Subcutaneous Q12H Warden FillersBass, Lawrence A, MD   70 mg at 12/19/20 2237   feeding supplement (OSMOLITE 1.5 CAL) liquid 1,000 mL  1,000 mL Per Tube Continuous Warden FillersBass, Lawrence A, MD 50 mL/hr at 12/20/20 0300 Infusion Verify at 12/20/20 0300   feeding supplement (PROSource TF) liquid 45 mL  45 mL Per Tube BID Warden FillersBass, Lawrence A, MD   45 mL at 12/19/20 2238   ferrous sulfate 300 (60 Fe) MG/5ML syrup 300 mg  300 mg Per Tube Q breakfast Warden FillersBass, Lawrence A, MD   300 mg at 12/19/20 [id number]   folic acid injection 1 mg  1 mg Intravenous Daily Elgergawy, Leana Roeawood S, MD   1 mg at 12/19/20 1105   furosemide (LASIX) injection 40 mg  40 mg Intravenous Daily Warden FillersBass, Lawrence A, MD   40 mg at 12/19/20 1105   guaiFENesin-dextromethorphan (ROBITUSSIN DM) 100-10 MG/5ML syrup 10 mL  10 mL Per Tube Q6H PRN Warden FillersBass, Lawrence A, MD   10 mL at 12/18/20 2241   hydrALAZINE (APRESOLINE) tablet 10 mg  10 mg Per Tube Q8H Warden FillersBass, Lawrence A,  MD   10 mg at 12/20/20 0519   insulin regular, human (MYXREDLIN) 100 units/ 100 mL infusion   Intravenous Continuous Warden FillersBass, Lawrence A, MD 2.2 mL/hr at 12/20/20 0609 2.2 Units/hr at 12/20/20 [id number]   lactated ringers infusion   Intravenous Continuous  Elgergawy, Leana Roe, MD 10 mL/hr at 12/18/20 1811 New Bag at 12/18/20 1811   loperamide (IMODIUM) capsule 2 mg  2 mg Oral PRN Elgergawy, Leana Roe, MD       MEDLINE mouth rinse  15 mL Mouth Rinse q12n4p Elgergawy, Leana Roe, MD   15 mL at 12/19/20 1645   methylPREDNISolone (MEDROL) tablet 16 mg  16 mg Oral Q breakfast Elgergawy, Leana Roe, MD       Followed by   Melene Muller ON 12/24/2020] methylPREDNISolone (MEDROL) tablet 8 mg  8 mg Oral Q breakfast Elgergawy, Leana Roe, MD       Followed by   Melene Muller ON 12/31/2020] methylPREDNISolone (MEDROL) tablet 4 mg  4 mg Oral Q breakfast Elgergawy, Leana Roe, MD       methylPREDNISolone (MEDROL) tablet 16 mg  16 mg Oral Q1400 Elgergawy, Leana Roe, MD       Followed by   Melene Muller ON 12/22/2020] methylPREDNISolone (MEDROL) tablet 8 mg  8 mg Oral Q1400 Elgergawy, Leana Roe, MD       Followed by   Melene Muller ON 12/25/2020] methylPREDNISolone (MEDROL) tablet 4 mg  4 mg Oral Q1400 Elgergawy, Leana Roe, MD       methylPREDNISolone (MEDROL) tablet 16 mg  16 mg Oral QHS Elgergawy, Leana Roe, MD       Followed by   Melene Muller ON 12/23/2020] methylPREDNISolone (MEDROL) tablet 8 mg  8 mg Oral QHS Elgergawy, Leana Roe, MD       Followed by   Melene Muller ON 12/26/2020] methylPREDNISolone (MEDROL) tablet 4 mg  4 mg Oral QHS Elgergawy, Leana Roe, MD       metoCLOPramide (REGLAN) injection 5 mg  5 mg Intravenous Q6H Mariel Aloe A, MD   5 mg at 12/20/20 0519   ondansetron (ZOFRAN) injection 4 mg  4 mg Intravenous Q8H PRN Elgergawy, Leana Roe, MD   4 mg at 12/20/20 0307   pantoprazole (PROTONIX) injection 40 mg  40 mg Intravenous Q12H Mariel Aloe A, MD   40 mg at 12/19/20 2231   phenol (CHLORASEPTIC) mouth spray 1 spray  1 spray Mouth/Throat PRN Elgergawy, Leana Roe, MD   1 spray at 12/19/20 0002   potassium chloride 10 mEq in 100 mL IVPB  10 mEq Intravenous Once Elgergawy, Leana Roe, MD       prenatal multivitamin tablet 1 tablet  1 tablet Per Tube Q1200 Warden Fillers, MD   1 tablet at 12/19/20 1254   prochlorperazine (COMPAZINE) injection 10 mg  10 mg Intravenous Q6H PRN Warden Fillers, MD   10 mg at 12/18/20 0448   promethazine (PHENERGAN) 25 mg in sodium chloride 0.9 % 50 mL IVPB  25 mg Intravenous Q6H PRN Warden Fillers, MD 200 mL/hr at 12/18/20 0306 25 mg at 12/18/20 0306   scopolamine (TRANSDERM-SCOP) 1 MG/3DAYS 1.5 mg  1 patch Transdermal Q72H Warden Fillers, MD   1.5 mg at 12/18/20 1808   sodium chloride flush (NS) 0.9 % injection 10-40 mL  10-40 mL Intracatheter Q12H Warden Fillers, MD   10 mL at 12/19/20 2317   sodium chloride flush (NS) 0.9 % injection 10-40 mL  10-40 mL Intracatheter PRN Warden Fillers, MD   20 mL at 12/20/20 0524   sodium chloride flush (NS) 0.9 % injection 3 mL  3 mL Intravenous Q12H Warden Fillers, MD   3 mL at 12/19/20 1106   sodium chloride flush (NS) 0.9 % injection 3 mL  3 mL  Intravenous PRN Warden Fillers, MD       thiamine 500mg  in normal saline (96ml) IVPB  500 mg Intravenous Q24H 45m, MD 100 mL/hr at 12/19/20 1443 500 mg at 12/19/20 1443    Labs:  Recent Labs  Lab 12/17/20 1600 12/18/20 0406 12/20/20 0430  WBC 5.7 5.1 5.9  HGB 7.0* 8.1* 7.1*  HCT 21.2* 24.9* 22.2*  PLT 198 186 222    Recent Labs  Lab 12/18/20 0406 12/19/20 0313 12/20/20 0430  NA 137 138 139  K 3.6 3.9 3.9  CL 99 104 103  CO2 30 29 29   BUN 13 20 24*  CREATININE 1.06* 1.12* 1.16*  CALCIUM 8.4* 7.9* 7.6*  PROT 5.0* 5.2* 5.2*  BILITOT 1.4* 1.0 0.7  ALKPHOS 67 59 71  ALT 8 9 11   AST 15 17 23   GLUCOSE 122* 129* 122*    Recent Labs  Lab 12/13/20 0811 12/15/20 0536 12/17/20 0049 12/17/20 1600 12/18/20 0406 12/20/20 0430  WBC 8.8 7.5 6.1 5.7 5.1 5.9  HGB 9.5* 8.6* 7.5* 7.0* 8.1* 7.1*  HCT 27.8*  25.4* 22.3* 21.2* 24.9* 22.2*  PLT 177 168 177 198 186 222  MCV 90.0 90.1 89.9 90.2 88.3 89.9  MCH 30.7 30.5 30.2 29.8 28.7 28.7  MCHC 34.2 33.9 33.6 33.0 32.5 32.0  RDW 13.2 14.0 13.5 13.4 15.4 15.2  LYMPHSABS 0.5* 1.1 1.1  --   --   --   MONOABS 0.2 0.4 0.4  --   --   --   EOSABS 0.0 0.4 0.4  --   --   --   BASOSABS 0.1 0.1 0.0  --   --   --     Recent Labs  Lab 12/15/20 0536 12/15/20 2328 12/16/20 0430 12/17/20 0049 12/17/20 1600 12/18/20 0406 12/19/20 0313 12/20/20 0430  NA 139  --  139 138 137 137 138 139  K 3.4*   < > 3.5 3.6 3.5 3.6 3.9 3.9  CL 113*  --  109 105 104 99 104 103  CO2 22  --  25 25 28 30 29 29   GLUCOSE 176*  --  143* 207* 167* 122* 129* 122*  BUN <5*  --  <5* <5* 8 13 20  24*  CREATININE 1.06*  --  1.09* 1.03* 0.49 1.06* 1.12* 1.16*  CALCIUM 7.4*  --  7.6* 7.9* 8.1* 8.4* 7.9* 7.6*  AST 25  --   --  16  --  15 17 23   ALT 12  --   --  10  --  8 9 11   ALKPHOS 78  --   --  72  --  67 59 71  BILITOT 1.4*  --   --  1.0  --  1.4* 1.0 0.7  ALBUMIN 1.5*  --   --  2.6*  --  2.7* 2.7* 2.8*  MG 1.4*   < > 1.7 1.4*  --  2.0 2.0 2.0  TSH  --   --   --  3.907  --   --   --   --   BNP  --   --   --  2,128.9*  --   --   --   --    < > = values in this interval not displayed.    Radiology:  No new imaging  Assessment & Plan:  Patient stable *Pregnancy: no OB issuesreactive NST and appropriate for gestational age; continue with qday fetal non stress tests. Patient for surveillance  fetal growth u/s on 8/1. IM service following for various other issues. Can d/w re: anemia.  *Dispo: per IM team  Cornelia Copa MD Attending Center for [facility]) GYN Consult Phone: [phone] (M-F, [phone]) & [phone]  (Off hours, weekends, holidays)

## 2020-12-20 NOTE — Progress Notes (Signed)
VASCULAR LAB    Right upper extremity venous duplex has been performed.  See CV proc for preliminary results.  Messaged Dr. Randol Kern results via secure chat.  Sebastian Lurz, RVT 12/20/2020, 3:46 PM

## 2020-12-20 NOTE — Progress Notes (Signed)
Per hospiltalist order, intermittent catheter performed by RN. clear, yellow urine collected. Provider notified of amount. Will repeat bladder scan if patient does not void by 6 am 12/20/20. Raelyn Ensign, RN

## 2020-12-20 NOTE — Progress Notes (Signed)
Pt w/ urge to void attempted w/ only 25cc out. Bladder scan performed showing ~649 in bladder. Hospitalist paged and orders received for I&O x1. 300cc clear urine out.
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
## 2020-12-21 ENCOUNTER — Other Ambulatory Visit: Payer: Self-pay

## 2020-12-21 DIAGNOSIS — R112 Nausea with vomiting, unspecified: Secondary | ICD-10-CM

## 2020-12-21 DIAGNOSIS — Z3A24 24 weeks gestation of pregnancy: Secondary | ICD-10-CM

## 2020-12-21 LAB — GLUCOSE, CAPILLARY
Glucose-Capillary: 100 mg/dL — ABNORMAL HIGH (ref 70–99)
Glucose-Capillary: 101 mg/dL — ABNORMAL HIGH (ref 70–99)
Glucose-Capillary: 101 mg/dL — ABNORMAL HIGH (ref 70–99)
Glucose-Capillary: 101 mg/dL — ABNORMAL HIGH (ref 70–99)
Glucose-Capillary: 109 mg/dL — ABNORMAL HIGH (ref 70–99)
Glucose-Capillary: 110 mg/dL — ABNORMAL HIGH (ref 70–99)
Glucose-Capillary: 115 mg/dL — ABNORMAL HIGH (ref 70–99)
Glucose-Capillary: 115 mg/dL — ABNORMAL HIGH (ref 70–99)
Glucose-Capillary: 118 mg/dL — ABNORMAL HIGH (ref 70–99)
Glucose-Capillary: 127 mg/dL — ABNORMAL HIGH (ref 70–99)
Glucose-Capillary: 129 mg/dL — ABNORMAL HIGH (ref 70–99)
Glucose-Capillary: 142 mg/dL — ABNORMAL HIGH (ref 70–99)
Glucose-Capillary: 145 mg/dL — ABNORMAL HIGH (ref 70–99)
Glucose-Capillary: 145 mg/dL — ABNORMAL HIGH (ref 70–99)
Glucose-Capillary: 91 mg/dL (ref 70–99)

## 2020-12-21 LAB — BPAM RBC
Blood Product Expiration Date: 202208242359
Blood Product Expiration Date: 202208252359
ISSUE DATE / TIME: 202207272252
ISSUE DATE / TIME: 202207301058
Unit Type and Rh: 6200
Unit Type and Rh: 6200

## 2020-12-21 LAB — TYPE AND SCREEN
ABO/RH(D): A POS
Antibody Screen: NEGATIVE
Unit division: 0
Unit division: 0

## 2020-12-21 LAB — LIPID PANEL
Cholesterol: 115 mg/dL (ref 0–200)
HDL: 25 mg/dL — ABNORMAL LOW (ref >40)
LDL Cholesterol: 69 mg/dL (ref 0–99)
Total CHOL/HDL Ratio: 4.6 RATIO
Triglycerides: 107 mg/dL (ref <150)
VLDL: 21 mg/dL (ref 0–40)

## 2020-12-21 LAB — COMPREHENSIVE METABOLIC PANEL
ALT: 17 U/L (ref 0–44)
AST: 27 U/L (ref 15–41)
Albumin: 2.6 g/dL — ABNORMAL LOW (ref 3.5–5.0)
Alkaline Phosphatase: 60 U/L (ref 38–126)
Anion gap: 8 (ref 5–15)
BUN: 22 mg/dL — ABNORMAL HIGH (ref 6–20)
CO2: 29 mmol/L (ref 22–32)
Calcium: 7.3 mg/dL — ABNORMAL LOW (ref 8.9–10.3)
Chloride: 103 mmol/L (ref 98–111)
Creatinine, Ser: 1.14 mg/dL — ABNORMAL HIGH (ref 0.44–1.00)
GFR, Estimated: >60 mL/min (ref >60)
Glucose, Bld: 112 mg/dL — ABNORMAL HIGH (ref 70–99)
Potassium: 4.2 mmol/L (ref 3.5–5.1)
Sodium: 140 mmol/L (ref 135–145)
Total Bilirubin: 0.7 mg/dL (ref 0.3–1.2)
Total Protein: 5.2 g/dL — ABNORMAL LOW (ref 6.5–8.1)

## 2020-12-21 LAB — CBC
HCT: 25.9 % — ABNORMAL LOW (ref 36.0–46.0)
Hemoglobin: 8.5 g/dL — ABNORMAL LOW (ref 12.0–15.0)
MCH: 29.5 pg (ref 26.0–34.0)
MCHC: 32.8 g/dL (ref 30.0–36.0)
MCV: 89.9 fL (ref 80.0–100.0)
Platelets: 232 10*3/uL (ref 150–400)
RBC: 2.88 MIL/uL — ABNORMAL LOW (ref 3.87–5.11)
RDW: 15.1 % (ref 11.5–15.5)
WBC: 5.7 10*3/uL (ref 4.0–10.5)
nRBC: 0 % (ref 0.0–0.2)

## 2020-12-21 LAB — CULTURE, OB URINE: Culture: <10000 — AB

## 2020-12-21 LAB — MAGNESIUM: Magnesium: 2 mg/dL (ref 1.7–2.4)

## 2020-12-21 LAB — PHOSPHORUS: Phosphorus: 3.7 mg/dL (ref 2.5–4.6)

## 2020-12-21 MED ORDER — HYDRALAZINE HCL 50 MG PO TABS
25.0000 mg | ORAL_TABLET | Freq: Three times a day (TID) | ORAL | Status: DC
  Administered 2020-12-21 – 2021-01-12 (×66): 25 mg
  Filled 2020-12-21 (×67): qty 1

## 2020-12-21 NOTE — Progress Notes (Signed)
PROGRESS NOTE    ELTON SOSSAMON  FGH:[id number] DOB: 25-Jul-1990 DOA: 12/16/2020 PCP: Hildred Laser, MD  [id number]/[id number]-[id number]   Assessment & Plan:   Active Problems:   Anemia affecting pregnancy in second trimester   AKI (acute kidney injury) (HCC)   Hyperemesis   Pregnancy with 24 completed weeks gestation   DKA, type 1, not at goal [facility])   Starvation ketoacidosis   Acute deep vein thrombosis (DVT) of brachial vein of right upper extremity (HCC)   Acute respiratory failure with hypoxia (HCC)   Vitreous hemorrhage of right eye (HCC)   Gastroparesis due to DM (HCC)   Diabetic retinopathy (HCC)   Acute on chronic combined systolic and diastolic CHF (congestive heart failure) (HCC)   Type 1 diabetes mellitus affecting pregnancy in second trimester, antepartum   HFrEF (heart failure with reduced ejection fraction) (HCC)   Patient is a 30 year old female G2,P1 at [redacted] weeks gestation who has a history of type 1 diabetes mellitus.  She presented to the emergency room for evaluation of persistent nausea and vomiting .  She was recently with multiple hospitalization, at [facility] for sepsis secondary to pneumonia and was there for 2 weeks.  Couple hospitalization at [facility] due to intractable nausea and vomiting. -Transferred from [facility] to [facility] 7/26 given her severe malnutrition, with hyperemesis gravidarum, for consideration of cortrak tube insertion.    Severe protein calorie malnutrition/hyperemesis gravidarum -Patient unable to have an oral intake for some time now, she is started on TPN, core track tube was inserted 7/27, she is started on tube feed managed by nutritionist, so far tolerating 50 cc/h.  TPN has been discontinued 7/28. -She remains with significant nausea and vomiting, but this is at her baseline over her last few weeks, does not appear to be worsening after starting her tube feed. . - she is with known history of brittle diabetes, her A1c was 5.8 11/21/2020 with  known history of diabetes with polyneuropathy and gastroparesis -Added Reglan with underlying history of gastroparesis. -She is on steroid hyperemesis gravidarum protocol, it seemed to help her symptoms, so we will discontinue today.   -She did receive albumin. -We will monitor closely for refeeding syndrome.  Phosphorus, magnesium, BMP daily. -Discontinued Remeron 7/21 given prolonged QTC. -Continue to monitor QTC closely, it does appear to be improving in the 460s range today  Type 1 diabetes mellitus insulin-dependent, uncontrolled with hypoglycemia. -Given patient oral intake is unreliable, and her CBG level fluctuating significantly, she will be kept on insulin drip for now.  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   -We will resume back on full dose Lovenox.    Urinary retention -Patient presents with Foley catheter inserted at [facility], this has been discontinued on admission, but since then patient has been having retention all her output is via in and out, she was encouraged to ambulate to see if it helps.  Hypomagnesemia -Repleted  Normocytic anemia -In the setting of pregnancy, she was transfused 1 unit PRBC  on 7/27.  Hemoglobin is 7.1 this morning, will transfuse another unit.  Prolonged QTC -QTC 5.0 yesterday, Remeron has been stopped, and Zofran has been changed to as needed, has  improved today, keep potassium> 4, magnesium> 2 -Improving QTC 460 today.  Did order topical antibiotic for site of right foot small wound due to sunburn.    DVT prophylaxis: WU:[id number] dose lovenox Code Status: Full code  Family Communication: Tried to call partner Mr. Sondra Come, left him a voicemail 7/29 Level of care: Antepartum Dispo:   The patient is from: transfer from [facility]  Remains inpatient appropriate because:Inpatient level of care appropriate due to severity of illness   Dispo: The patient is from: [facility]              Anticipated d/c is to: Home              Patient currently is not medically stable to d/c.              Difficult to place patient No   Subjective :  Is with recurrent urinary retention, she remains with diarrhea but it did not improve, she remains with nausea, vomiting, but no significant change.   Objective: Vitals:   12/21/20 0715 12/21/20 0720 12/21/20 0725 12/21/20 0735  BP:    (!) 150/70  Pulse:    (!) 103  Resp:    18  Temp:    97.6 F (36.4 C)  TempSrc:    Oral  SpO2: 96% 95% 96% 94%  Weight:      Height:        Intake/Output Summary (Last 24 hours) at 12/21/2020 1209 Last data filed at 12/21/2020 1030 Gross per 24 hour  Intake 2043.5 ml  Output 1425 ml  Net 618.5 ml   Filed Weights   12/19/20 0436 12/20/20 0602 12/21/20 0613  Weight: 68.4 kg 67.6 kg 69.4 kg     Examination:   Awake Alert, Oriented X 3, frail, ill-appearing Symmetrical Chest wall movement, Good air movement bilaterally, CTAB RRR,No Gallops,Rubs or new Murmurs, No Parasternal Heave +ve B.Sounds, Abd Soft, No tenderness, No rebound - guarding or rigidity. No Cyanosis, Clubbing , +1 edema, and with small wound in the dorsal area of right foot, with surrounding erythema, improving.     Data Reviewed: I have personally reviewed following labs and imaging studies  CBC: Recent Labs  Lab 12/15/20 0536 12/17/20 0049 12/17/20 1600 12/18/20 0406 12/20/20 0430  12/21/20 0550  WBC 7.5 6.1 5.7 5.1 5.9 5.7  NEUTROABS 5.5 4.3  --   --   --   --   HGB 8.6* 7.5* 7.0* 8.1* 7.1* 8.5*  HCT 25.4* 22.3* 21.2* 24.9* 22.2* 25.9*  MCV 90.1 89.9 90.2 88.3 89.9 89.9  PLT 168 177 198 186 222 232   Basic Metabolic Panel: Recent Labs  Lab 12/16/20 0430 12/17/20 0049 12/17/20 1600 12/18/20 0406 12/19/20 0313 12/20/20 0430 12/21/20 0550  NA 139 138 137 137 138 139 140  K 3.5 3.6 3.5 3.6 3.9 3.9 4.2  CL 109 105 104 99 104 103 103  CO2 25 25 28 30 29 29 29   GLUCOSE 143* 207* 167* 122* 129* 122* 112*  BUN <5* <5* 8 13 20  24* 22*  CREATININE 1.09* 1.03* 0.49 1.06* 1.12* 1.16* 1.14*  CALCIUM 7.6* 7.9* 8.1* 8.4* 7.9* 7.6* 7.3*  MG 1.7 1.4*  --  2.0 2.0 2.0  --   PHOS 3.2 3.7  --  4.2 2.8 3.0 3.7  GFR: Estimated Creatinine Clearance: 64.8 mL/min (A) (by C-G formula based on SCr of 1.14 mg/dL (H)). Liver Function Tests: Recent Labs  Lab 12/17/20 0049 12/18/20 0406 12/19/20 0313 12/20/20 0430 12/21/20 0550  AST 16 15 17 23 27   ALT 10 8 9 11 17   ALKPHOS 72 67 59 71 60  BILITOT 1.0 1.4* 1.0 0.7 0.7  PROT 4.9* 5.0* 5.2* 5.2* 5.2*  ALBUMIN 2.6* 2.7* 2.7* 2.8* 2.6*   No results for input(s): LIPASE, AMYLASE in the last 168 hours. No results for input(s): AMMONIA in the last 168 hours. Coagulation Profile: No results for input(s): INR, PROTIME in the last 168 hours. Cardiac Enzymes: Recent Labs  Lab 12/17/20 0049  CKTOTAL 54  55   BNP (last 3 results) No results for input(s): PROBNP in the last 8760 hours. HbA1C: No results for input(s): HGBA1C in the last 72 hours. CBG: Recent Labs  Lab 12/21/20 0505 12/21/20 0704 12/21/20 0905 12/21/20 1102 12/21/20 1207  GLUCAP 115* 100* 101* 142* 145*   Lipid Profile: Recent Labs    12/21/20 0550  CHOL 115  HDL 25*  LDLCALC 69  TRIG 161107  CHOLHDL 4.6    Thyroid Function Tests: No results for input(s): TSH, T4TOTAL, FREET4, T3FREE, THYROIDAB in the last 72 hours.  Anemia Panel: No  results for input(s): VITAMINB12, FOLATE, FERRITIN, TIBC, IRON, RETICCTPCT in the last 72 hours.  Sepsis Labs: No results for input(s): PROCALCITON, LATICACIDVEN in the last 168 hours.  Recent Results (from the past 240 hour(s))  Resp Panel by RT-PCR (Flu A&B, Covid) Nasopharyngeal Swab     Status: None   Collection Time: 12/13/20  1:48 AM   Specimen: Nasopharyngeal Swab; Nasopharyngeal(NP) swabs in vial transport medium  Result Value Ref Range Status   SARS Coronavirus 2 by RT PCR NEGATIVE NEGATIVE Final    Comment: (NOTE) SARS-CoV-2 target nucleic acids are NOT DETECTED.  The SARS-CoV-2 RNA is generally detectable in upper respiratory specimens during the acute phase of infection. The lowest concentration of SARS-CoV-2 viral copies this assay can detect is 138 copies/mL. A negative result does not preclude SARS-Cov-2 infection and should not be used as the sole basis for treatment or other patient management decisions. A negative result may occur with  improper specimen collection/handling, submission of specimen other than nasopharyngeal swab, presence of viral mutation(s) within the areas targeted by this assay, and inadequate number of viral copies(<138 copies/mL). A negative result must be combined with clinical observations, patient history, and epidemiological information. The expected result is Negative.  Fact Sheet for Patients:  BloggerCourse.comhttps://www.fda.gov/media/152166/download  Fact Sheet for Healthcare Providers:  SeriousBroker.ithttps://www.fda.gov/media/152162/download  This test is no t yet approved or cleared by the [place] FDA and  has been authorized for detection and/or diagnosis of SARS-CoV-2 by FDA under an Emergency Use Authorization (EUA). This EUA will remain  in effect (meaning this test can be used) for the duration of the COVID-19 declaration under Section 564(b)(1) of the Act, 21 U.S.C.section 360bbb-3(b)(1), unless the authorization is terminated  or revoked sooner.        Influenza A by PCR NEGATIVE NEGATIVE Final   Influenza B by PCR NEGATIVE NEGATIVE Final    Comment: (NOTE) The Xpert Xpress SARS-CoV-2/FLU/RSV plus assay is intended as an aid in the diagnosis of influenza from Nasopharyngeal swab specimens and should not be used as a sole basis for treatment. Nasal washings and aspirates are unacceptable for Xpert Xpress SARS-CoV-2/FLU/RSV testing.  Fact Sheet for Patients: BloggerCourse.comhttps://www.fda.gov/media/152166/download  Fact Sheet  for Healthcare Providers: SeriousBroker.it  This test is not yet approved or cleared by the [place] and has been authorized for detection and/or diagnosis of SARS-CoV-2 by FDA under an Emergency Use Authorization (EUA). This EUA will remain in effect (meaning this test can be used) for the duration of the COVID-19 declaration under Section 564(b)(1) of the Act, 21 U.S.C. section 360bbb-3(b)(1), unless the authorization is terminated or revoked.  Performed at [facility], [address] [place], [place] [address]   MRSA Next Gen by PCR, Nasal     Status: None   Collection Time: 12/15/20  9:20 PM   Specimen: Nasal Mucosa; Nasal Swab  Result Value Ref Range Status   MRSA by PCR Next Gen NOT DETECTED NOT DETECTED Final    Comment: (NOTE) The GeneXpert MRSA Assay (FDA approved for NASAL specimens only), is one component of a comprehensive MRSA colonization surveillance program. It is not intended to diagnose MRSA infection nor to guide or monitor treatment for MRSA infections. Test performance is not FDA approved in patients less than 87 years old. Performed at [facility], [address] [place], [place] [address]   [facility] Urine Culture     Status: Abnormal   Collection Time: 12/20/20  6:29 AM   Specimen: OB Catheterized; Urine  Result Value Ref Range Status   Specimen Description OB CATHETERIZED  Final   Special Requests NONE  Final   Culture (A)  Final     <10,000 COLONIES/mL INSIGNIFICANT GROWTH NO GROUP B STREP (S.AGALACTIAE) ISOLATED Performed at [facility], [address], [place], [place] [address]    Report Status 12/21/2020 FINAL  Final  Wet prep, genital     Status: None   Collection Time: 12/20/20  5:41 PM   Specimen: Vaginal  Result Value Ref Range Status   Yeast Wet Prep HPF POC NONE SEEN NONE SEEN Final   Trich, Wet Prep NONE SEEN NONE SEEN Final   Clue Cells Wet Prep HPF POC NONE SEEN NONE SEEN Final   WBC, Wet Prep HPF POC NONE SEEN NONE SEEN Final   Sperm NONE SEEN  Final    Comment: Performed at [facility], [address], [place], [place] [address]      Radiology Studies: VAS [place] UPPER EXTREMITY VENOUS DUPLEX  Result Date: 12/20/2020 UPPER VENOUS STUDY  Patient Name:  JANAYSIA MCLEROY  Date of Exam:   12/20/2020 Medical Rec #: [id number]        Accession #:    [id number] Date of Birth: [birth year]       Patient Gender: F Patient Age:   59Y Exam Location:  [facility] Procedure:      VAS [place] UPPER EXTREMITY VENOUS DUPLEX Referring Phys: [id number] Thor Nannini S Kara Melching --------------------------------------------------------------------------------  Indications: F/U DVT in right brachial found at [facility] 12/07/20 Anticoagulation: Eliquis. Comparison Study: No prior study at [facility]. Prior right UEV done at [facility] 12/07/20 Performing Technologist: Sherren Kerns RVS  Examination Guidelines: A complete evaluation includes B-mode imaging, spectral Doppler, color Doppler, and power Doppler as needed of all accessible portions of each vessel. Bilateral testing is considered an integral part of a complete examination. Limited examinations for reoccurring indications may be performed as noted.  Right Findings: +----------+------------+---------+-----------+----------+---------------+ RIGHT     CompressiblePhasicitySpontaneousProperties    Summary     +----------+------------+---------+-----------+----------+---------------+ IJV            Full       Yes  Yes                              +----------+------------+---------+-----------+----------+---------------+ Subclavian               Yes       Yes                              +----------+------------+---------+-----------+----------+---------------+ Axillary                 Yes       Yes                              +----------+------------+---------+-----------+----------+---------------+ Brachial      None       No        No                   Chronic     +----------+------------+---------+-----------+----------+---------------+ Radial        Full                                                  +----------+------------+---------+-----------+----------+---------------+ Ulnar                                               patent by color +----------+------------+---------+-----------+----------+---------------+ Cephalic    Partial                                     Chronic     +----------+------------+---------+-----------+----------+---------------+ Basilic       Full                                                  +----------+------------+---------+-----------+----------+---------------+  Left Findings: +----------+------------+---------+-----------+----------+-------+ LEFT      CompressiblePhasicitySpontaneousPropertiesSummary +----------+------------+---------+-----------+----------+-------+ Subclavian               Yes       Yes                      +----------+------------+---------+-----------+----------+-------+  Summary:  Right: Findings consistent with acute superficial vein thrombosis involving the right cephalic vein. 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  Left: No evidence of thrombosis in the subclavian.  *See table(s) above for measurements and observations.  Diagnosing physician: Waverly Ferrari MD Electronically signed by Waverly Ferrari MD  on 12/20/2020 at 6:45:02 PM.    Final      Scheduled Meds:  bacitracin   Topical BID   Chlorhexidine Gluconate Cloth  6 each Topical Daily   enoxaparin  70 mg Subcutaneous Q12H   feeding supplement (PROSource TF)  45 mL Per Tube BID   ferrous sulfate  300 mg Per Tube Q breakfast   folic acid  1 mg Intravenous Daily   furosemide  40 mg Intravenous Daily   hydrALAZINE  25  mg Per Tube Q8H   mouth rinse  15 mL Mouth Rinse q12n4p   metoCLOPramide (REGLAN) injection  5 mg Intravenous Q6H   pantoprazole (PROTONIX) IV  40 mg Intravenous Q12H   prenatal multivitamin  1 tablet Per Tube Q1200   scopolamine  1 patch Transdermal Q72H   sodium chloride flush  10-40 mL Intracatheter Q12H   sodium chloride flush  3 mL Intravenous Q12H   Continuous Infusions:  sodium chloride Stopped (12/19/20 1700)   feeding supplement (OSMOLITE 1.5 CAL) 1,000 mL (12/20/20 1535)   insulin 3.6 Units/hr (12/21/20 1105)   lactated ringers 10 mL/hr at 12/18/20 1811   promethazine (PHENERGAN)25mg  in 69mL NS 25 mg (12/20/20 1802)   thiamine injection 500 mg (12/20/20 1447)     LOS: 5 days     Huey Bienenstock, MD [facility] If 7PM-7AM, please contact night-coverage 12/21/2020, 12:09 PM

## 2020-12-21 NOTE — Progress Notes (Signed)
Patient ID: Ashley Camacho, female   DOB: August 19, 1990, 30 y.o.   MRN: [id number] FACULTY PRACTICE ANTEPARTUM(COMPREHENSIVE) NOTE  Ashley Camacho is a 30 y.o. G2P0101 at [redacted]w[redacted]d b who is admitted for hyperemesis/gastroparesis in setting of Type 1 DM Fetal presentation is unsure. Length of Stay:  5  Days  Subjective: Difficulty voiding. Diarrhea Patient reports the fetal movement as active. Patient reports uterine contraction  activity as none. Patient reports  vaginal bleeding as none. Patient describes fluid per vagina as None.  Vitals:  Blood pressure 140/78, pulse 98, temperature (!) 97.5 F (36.4 C), temperature source Oral, resp. rate 17, height 5\' 1"  (1.549 m), weight 69.4 kg, SpO2 94 %. Physical Examination:  General appearance - oriented to person, place, and time and ill-appearing Heart - normal rate and regular rhythm Abdomen - soft, nontender, nondistended Fundal Height:  size equals dates Extremities: extremities normal, atraumatic, no cyanosis or edema and Homans sign is negative, no sign of DVT  Membranes:intact  Fetal Monitoring:   Fetal Heart Rate A   Mode Doppler filed at 12/21/2020 0109  Baseline Rate (A) 145 bpm filed at 12/21/2020 0109  Variability 6-25 BPM filed at 12/20/2020 1208  Accelerations 10 x 10 filed at 12/20/2020 1208  Decelerations Variable filed at 12/20/2020 1208    Labs:  Results for orders placed or performed during the hospital encounter of 12/16/20 (from the past 24 hour(s))  Glucose, capillary   Collection Time: 12/20/20  7:51 AM  Result Value Ref Range   Glucose-Capillary 109 (H) 70 - 99 mg/dL  Glucose, capillary   Collection Time: 12/20/20  9:56 AM  Result Value Ref Range   Glucose-Capillary 97 70 - 99 mg/dL  Prepare RBC (crossmatch)   Collection Time: 12/20/20 10:16 AM  Result Value Ref Range   Order Confirmation      ORDER PROCESSED BY BLOOD BANK Performed at [facility], [address] [place], [place] [address]    Glucose, capillary   Collection Time: 12/20/20 12:04 PM  Result Value Ref Range   Glucose-Capillary 154 (H) 70 - 99 mg/dL  Glucose, capillary   Collection Time: 12/20/20  1:24 PM  Result Value Ref Range   Glucose-Capillary 100 (H) 70 - 99 mg/dL  Protein / creatinine ratio, urine   Collection Time: 12/20/20  1:53 PM  Result Value Ref Range   Creatinine, Urine 28.71 mg/dL   Total Protein, Urine 41 mg/dL   Protein Creatinine Ratio 1.43 (H) 0.00 - 0.15 mg/mg[Cre]  Glucose, capillary   Collection Time: 12/20/20  2:24 PM  Result Value Ref Range   Glucose-Capillary 115 (H) 70 - 99 mg/dL   Comment 1 Notify RN   Glucose, capillary   Collection Time: 12/20/20  3:43 PM  Result Value Ref Range   Glucose-Capillary 125 (H) 70 - 99 mg/dL  Glucose, capillary   Collection Time: 12/20/20  4:34 PM  Result Value Ref Range   Glucose-Capillary 91 70 - 99 mg/dL  Wet prep, genital   Collection Time: 12/20/20  5:41 PM   Specimen: Vaginal  Result Value Ref Range   Yeast Wet Prep HPF POC NONE SEEN NONE SEEN   Trich, Wet Prep NONE SEEN NONE SEEN   Clue Cells Wet Prep HPF POC NONE SEEN NONE SEEN   WBC, Wet Prep HPF POC NONE SEEN NONE SEEN   Sperm NONE SEEN   Glucose, capillary   Collection Time: 12/20/20  5:48 PM  Result Value Ref Range   Glucose-Capillary 126 (  H) 70 - 99 mg/dL  Glucose, capillary   Collection Time: 12/20/20  6:49 PM  Result Value Ref Range   Glucose-Capillary 136 (H) 70 - 99 mg/dL  Glucose, capillary   Collection Time: 12/20/20  7:51 PM  Result Value Ref Range   Glucose-Capillary 128 (H) 70 - 99 mg/dL  Glucose, capillary   Collection Time: 12/20/20  8:58 PM  Result Value Ref Range   Glucose-Capillary 112 (H) 70 - 99 mg/dL  Glucose, capillary   Collection Time: 12/20/20 10:03 PM  Result Value Ref Range   Glucose-Capillary 108 (H) 70 - 99 mg/dL  Glucose, capillary   Collection Time: 12/20/20 11:05 PM  Result Value Ref Range   Glucose-Capillary 117 (H) 70 - 99 mg/dL   Glucose, capillary   Collection Time: 12/21/20  1:09 AM  Result Value Ref Range   Glucose-Capillary 101 (H) 70 - 99 mg/dL  Glucose, capillary   Collection Time: 12/21/20  3:05 AM  Result Value Ref Range   Glucose-Capillary 115 (H) 70 - 99 mg/dL  Glucose, capillary   Collection Time: 12/21/20  5:05 AM  Result Value Ref Range   Glucose-Capillary 115 (H) 70 - 99 mg/dL  Comprehensive metabolic panel   Collection Time: 12/21/20  5:50 AM  Result Value Ref Range   Sodium 140 135 - 145 mmol/L   Potassium 4.2 3.5 - 5.1 mmol/L   Chloride 103 98 - 111 mmol/L   CO2 29 22 - 32 mmol/L   Glucose, Bld 112 (H) 70 - 99 mg/dL   BUN 22 (H) 6 - 20 mg/dL   Creatinine, Ser 4.70 (H) 0.44 - 1.00 mg/dL   Calcium 7.3 (L) 8.9 - 10.3 mg/dL   Total Protein 5.2 (L) 6.5 - 8.1 g/dL   Albumin 2.6 (L) 3.5 - 5.0 g/dL   AST 27 15 - 41 U/L   ALT 17 0 - 44 U/L   Alkaline Phosphatase 60 38 - 126 U/L   Total Bilirubin 0.7 0.3 - 1.2 mg/dL   GFR, Estimated >96 >28 mL/min   Anion gap 8 5 - 15  Phosphorus   Collection Time: 12/21/20  5:50 AM  Result Value Ref Range   Phosphorus 3.7 2.5 - 4.6 mg/dL  CBC   Collection Time: 12/21/20  5:50 AM  Result Value Ref Range   WBC 5.7 4.0 - 10.5 K/uL   RBC 2.88 (L) 3.87 - 5.11 MIL/uL   Hemoglobin 8.5 (L) 12.0 - 15.0 g/dL   HCT 36.6 (L) 29.4 - 76.5 %   MCV 89.9 80.0 - 100.0 fL   MCH 29.5 26.0 - 34.0 pg   MCHC 32.8 30.0 - 36.0 g/dL   RDW 46.5 03.5 - 46.5 %   Platelets 232 150 - 400 K/uL   nRBC 0.0 0.0 - 0.2 %  Lipid panel   Collection Time: 12/21/20  5:50 AM  Result Value Ref Range   Cholesterol 115 0 - 200 mg/dL   Triglycerides 681 <275 mg/dL   HDL 25 (L) >17 mg/dL   Total CHOL/HDL Ratio 4.6 RATIO   VLDL 21 0 - 40 mg/dL   LDL Cholesterol 69 0 - 99 mg/dL  Glucose, capillary   Collection Time: 12/21/20  7:04 AM  Result Value Ref Range   Glucose-Capillary 100 (H) 70 - 99 mg/dL     Medications:  Scheduled  bacitracin   Topical BID   Chlorhexidine Gluconate  Cloth  6 each Topical Daily   enoxaparin  70 mg Subcutaneous Q12H  feeding supplement (PROSource TF)  45 mL Per Tube BID   ferrous sulfate  300 mg Per Tube Q breakfast   folic acid  1 mg Intravenous Daily   furosemide  40 mg Intravenous Daily   hydrALAZINE  10 mg Per Tube Q8H   mouth rinse  15 mL Mouth Rinse q12n4p   methylPREDNISolone  16 mg Oral Q breakfast   Followed by   Melene Muller ON 12/24/2020] methylPREDNISolone  8 mg Oral Q breakfast   Followed by   Melene Muller ON 12/31/2020] methylPREDNISolone  4 mg Oral Q breakfast   methylPREDNISolone  16 mg Oral Q1400   Followed by   Melene Muller ON 12/22/2020] methylPREDNISolone  8 mg Oral Q1400   Followed by   Melene Muller ON 12/25/2020] methylPREDNISolone  4 mg Oral Q1400   methylPREDNISolone  16 mg Oral QHS   Followed by   Melene Muller ON 12/23/2020] methylPREDNISolone  8 mg Oral QHS   Followed by   Melene Muller ON 12/26/2020] methylPREDNISolone  4 mg Oral QHS   metoCLOPramide (REGLAN) injection  5 mg Intravenous Q6H   pantoprazole (PROTONIX) IV  40 mg Intravenous Q12H   prenatal multivitamin  1 tablet Per Tube Q1200   scopolamine  1 patch Transdermal Q72H   sodium chloride flush  10-40 mL Intracatheter Q12H   sodium chloride flush  3 mL Intravenous Q12H   I have reviewed the patient's current medications.  ASSESSMENT: Patient Active Problem List   Diagnosis Date Noted   Type 1 diabetes mellitus affecting pregnancy in second trimester, antepartum    HFrEF (heart failure with reduced ejection fraction) (HCC)    Acute deep vein thrombosis (DVT) of brachial vein of right upper extremity (HCC) 12/17/2020   Acute respiratory failure with hypoxia (HCC) 12/17/2020   Anxiety 12/17/2020   Diabetic retinopathy (HCC) 12/17/2020   Acute on chronic combined systolic and diastolic CHF (congestive heart failure) (HCC)    Starvation ketoacidosis 12/16/2020   Hyperemesis 12/13/2020   Pregnancy with 24 completed weeks gestation 12/13/2020   DKA, type 1, not at goal [facility]) 12/13/2020    Sunburn 12/13/2020   Hypothyroidism 12/10/2020   Malnutrition (HCC) 12/10/2020   Vitreous hemorrhage of right eye (HCC) 12/10/2020   Pleural effusion associated with pulmonary infection 11/25/2020   Pneumonia affecting pregnancy 11/24/2020   Diabetes mellitus affecting pregnancy, second trimester 11/24/2020   Edema 11/24/2020   Decreased urine output 11/24/2020   Shortness of breath    Zinc deficiency    SOB (shortness of breath)    Social problem 11/21/2020   Nausea and vomiting during pregnancy prior to [redacted] weeks gestation 11/21/2020   Low serum vitamin B12    Indication for care in labor and delivery, antepartum 11/20/2020   Anemia affecting pregnancy in second trimester    Absolute anemia    AKI (acute kidney injury) (HCC)    Nausea and vomiting during pregnancy 10/23/2020   History of premature delivery, currently pregnant, second trimester 10/13/2020   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 No urge to void currently so will give her opportunity to void this morning.  Type 1 diabetes mellitus in second trimester:   Pt is receiving tube feeds and tolerating well for the most part.  She is  tolerating 50 cc/hr.  Insulin drip per medicine for control of blood sugars, especially with steroids being utilized.   Hyperemesis:  Continue with tube feeds as tolerated.  Per pt her nausea level is 3/10.  Continue with antiemetics, especially reglan.  Would d/c steroids in 1-2 days as the utility may be limited in setting of gastroparesis.   CHF:  per medicine/Cards   25 week pregnancy:  daily NST and FHT, will get growth scan next week  Scheryl Darter 12/21/2020,7:35 AM

## 2020-12-21 NOTE — Progress Notes (Signed)
Progress Note  Patient Name: Ashley Camacho Date of Encounter: 12/21/2020  [facility] Cardiologist: New to Dr. 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  Had urinary retention s/p I/O catheter.  Patient notes that she feels slightly better. BP is now on legs given PICC and DVT.  Inpatient Medications    Scheduled Meds:  bacitracin   Topical BID   Chlorhexidine Gluconate Cloth  6 each Topical Daily   enoxaparin  70 mg Subcutaneous Q12H   feeding supplement (PROSource TF)  45 mL Per Tube BID   ferrous sulfate  300 mg Per Tube Q breakfast   folic acid  1 mg Intravenous Daily   furosemide  40 mg Intravenous Daily   hydrALAZINE  10 mg Per Tube Q8H   mouth rinse  15 mL Mouth Rinse q12n4p   methylPREDNISolone  16 mg Oral Q breakfast   Followed by   Melene Muller ON 12/24/2020] methylPREDNISolone  8 mg Oral Q breakfast   Followed by   Melene Muller ON 12/31/2020] methylPREDNISolone  4 mg Oral Q breakfast   methylPREDNISolone  16 mg Oral Q1400   Followed by   Melene Muller ON 12/22/2020] methylPREDNISolone  8 mg Oral Q1400   Followed by   Melene Muller ON 12/25/2020] methylPREDNISolone  4 mg Oral Q1400   methylPREDNISolone  16 mg Oral QHS   Followed by   Melene Muller ON 12/23/2020] methylPREDNISolone  8 mg Oral QHS   Followed by   Melene Muller ON 12/26/2020] methylPREDNISolone  4 mg Oral QHS   metoCLOPramide (REGLAN) injection  5 mg Intravenous Q6H   pantoprazole (PROTONIX) IV  40 mg Intravenous Q12H   prenatal multivitamin  1 tablet Per Tube Q1200   scopolamine  1 patch Transdermal Q72H   sodium chloride flush  10-40 mL Intracatheter Q12H   sodium chloride flush  3 mL Intravenous Q12H   Continuous Infusions:  sodium chloride Stopped (12/19/20 1700)   feeding supplement (OSMOLITE 1.5 CAL) 1,000 mL (12/20/20 1535)   insulin 1.5 Units/hr (12/21/20 0907)   lactated ringers 10 mL/hr at 12/18/20 1811   promethazine (PHENERGAN)25mg  in 73mL NS 25 mg (12/20/20 1802)   thiamine injection  500 mg (12/20/20 1447)   PRN Meds: sodium chloride, acetaminophen **OR** acetaminophen, dextrose, guaiFENesin-dextromethorphan, loperamide, ondansetron, phenol, prochlorperazine, promethazine (PHENERGAN)25mg  in 53mL NS, sodium chloride flush, sodium chloride flush   Vital Signs    Vitals:   12/21/20 0715 12/21/20 0720 12/21/20 0725 12/21/20 0735  BP:    (!) 150/70  Pulse:    (!) 103  Resp:    18  Temp:    97.6 F (36.4 C)  TempSrc:    Oral  SpO2: 96% 95% 96% 94%  Weight:      Height:        Intake/Output Summary (Last 24 hours) at 12/21/2020 4709 Last data filed at 12/21/2020 0850 Gross per 24 hour  Intake 2096.81 ml  Output 1425 ml  Net 671.81 ml   Last 3 Weights 12/21/2020 12/20/2020 12/19/2020  Weight (lbs) 153 lb 0.6 oz 149 lb 150 lb 12 oz  Weight (kg) 69.418 kg 67.586 kg 68.38 kg      Telemetry    SR and sinus tachycardia  - Personally Reviewed  ECG    None since 12/17/20- Personally Reviewed  Physical Exam   GEN: Appears older than her age, chronic ill appearing Neck: No JVD Cardiac: Regular tachycardia, no murmurs, rubs, or gallops.  Respiratory: Clear but diminished at base  on auscultation bilaterally.  GI: Soft, nontender, non-distended. DHT in place, feeding infusing MS: BLE 1+ edema, no tender Neuro:  Alert and oriented x3, quiet speech, follow commands  Psych: Depressed affect   Labs    High Sensitivity Troponin:   Recent Labs  Lab 11/21/20 1937 11/21/20 2136  TROPONINIHS 9 8      Chemistry Recent Labs  Lab 12/19/20 0313 12/20/20 0430 12/21/20 0550  NA 138 139 140  K 3.9 3.9 4.2  CL 104 103 103  CO2 29 29 29   GLUCOSE 129* 122* 112*  BUN 20 24* 22*  CREATININE 1.12* 1.16* 1.14*  CALCIUM 7.9* 7.6* 7.3*  PROT 5.2* 5.2* 5.2*  ALBUMIN 2.7* 2.8* 2.6*  AST 17 23 27   ALT 9 11 17   ALKPHOS 59 71 60  BILITOT 1.0 0.7 0.7  GFRNONAA >60 >60 >60  ANIONGAP 5 7 8      Hematology Recent Labs  Lab 12/18/20 0406 12/20/20 0430 12/21/20 0550   WBC 5.1 5.9 5.7  RBC 2.82* 2.47* 2.88*  HGB 8.1* 7.1* 8.5*  HCT 24.9* 22.2* 25.9*  MCV 88.3 89.9 89.9  MCH 28.7 28.7 29.5  MCHC 32.5 32.0 32.8  RDW 15.4 15.2 15.1  PLT 186 222 232    BNP Recent Labs  Lab 12/17/20 0049  BNP 2,128.9*     DDimer No results for input(s): DDIMER in the last 168 hours.   Radiology    VAS 12/20/20 UPPER EXTREMITY VENOUS DUPLEX  Result Date: 12/20/2020 UPPER VENOUS STUDY  Patient Name:  Ashley Camacho  Date of Exam:   12/20/2020 Medical Rec #: [place]        Accession #:    12/22/2020 Date of Birth: [birth year]       Patient Gender: F Patient Age:   30Y Exam Location:  [facility] Procedure:      VAS [id number] UPPER EXTREMITY VENOUS DUPLEX Referring Phys: [id number] [address] S ELGERGAWY --------------------------------------------------------------------------------  Indications: F/U DVT in right brachial found at [facility] 12/07/20 Anticoagulation: Eliquis. Comparison Study: No prior study at [facility]. Prior right UEV done at [facility] 12/07/20 Performing Technologist: 12/09/20 RVS  Examination Guidelines: A complete evaluation includes B-mode imaging, spectral Doppler, color Doppler, and power Doppler as needed of all accessible portions of each vessel. Bilateral testing is considered an integral part of a complete examination. Limited examinations for reoccurring indications may be performed as noted.  Right Findings: +----------+------------+---------+-----------+----------+---------------+ RIGHT     CompressiblePhasicitySpontaneousProperties    Summary     +----------+------------+---------+-----------+----------+---------------+ IJV           Full       Yes       Yes                              +----------+------------+---------+-----------+----------+---------------+ Subclavian               Yes       Yes                              +----------+------------+---------+-----------+----------+---------------+ Axillary                 Yes       Yes                               +----------+------------+---------+-----------+----------+---------------+ Brachial  None       No        No                   Chronic     +----------+------------+---------+-----------+----------+---------------+ Radial        Full                                                  +----------+------------+---------+-----------+----------+---------------+ Ulnar                                               patent by color +----------+------------+---------+-----------+----------+---------------+ Cephalic    Partial                                     Chronic     +----------+------------+---------+-----------+----------+---------------+ Basilic       Full                                                  +----------+------------+---------+-----------+----------+---------------+  Left Findings: +----------+------------+---------+-----------+----------+-------+ LEFT      CompressiblePhasicitySpontaneousPropertiesSummary +----------+------------+---------+-----------+----------+-------+ Subclavian               Yes       Yes                      +----------+------------+---------+-----------+----------+-------+  Summary:  Right: Findings consistent with acute superficial vein thrombosis involving the right cephalic vein. 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  Left: No evidence of thrombosis in the subclavian.  *See table(s) above for measurements and observations.  Diagnosing physician: Waverly Ferrari MD Electronically signed by Waverly Ferrari MD on 12/20/2020 at 6:45:02 PM.    Final     Cardiac Studies   Echocardiogram 12/16/20: 1. Left ventricular ejection fraction, by estimation, is 40 to 45%. The  left ventricle has mildly decreased function. The left ventricle  demonstrates global hypokinesis. Left ventricular diastolic parameters are  indeterminate.   2. Right ventricular  systolic function is normal. The right ventricular  size is mildly enlarged. There is normal pulmonary artery systolic  pressure. The estimated right ventricular systolic pressure is 29.2 mmHg.   3. Right atrial size was mildly dilated.   4. A small pericardial effusion is present.   5. The mitral valve is normal in structure. Mild to moderate mitral valve  regurgitation.   6. The aortic valve is tricuspid. Aortic valve regurgitation is not  visualized. No aortic stenosis is present.   7. The inferior vena cava is normal in size with greater than 50%  respiratory variability, suggesting right atrial pressure of 3 mmHg.   Echocardiogram 11/25/20: INTERPRETATION ---------------------------------------------------------------    MILD LV DYSFUNCTION (See above) - EF 45%   NORMAL LA PRESSURES WITH NORMAL DIASTOLIC FUNCTION    NORMAL RIGHT VENTRICULAR SYSTOLIC FUNCTION    VALVULAR REGURGITATION: TRIVIAL AR, MILD MR, MILD PR, TRIVIAL TR    NO VALVULAR STENOSIS    SMALL PERICARDIAL EFFUSION (See above)  MILD TO MODERATE MR  Patient Profile     Ashley Camacho is a 30 y.o. female with a PMH of recently diagnosed chronic combined CHF, DM type 1, anemia, and currently pregnant at 25w gestation, cardiology is following since 12/17/2020 for the evaluation of CHF.    Assessment & Plan    Acute on chronic combined CHF HTN with DM Cardiomyopathy of unclear etiology - She has been struggling with hyperemesis gravida resulting in malnutrition.  - Echo 12/16/20 with EF 40-45% (45% 11/25/20 at [facility]).   - Etiology: unclear; nutritional deficiency is highest on DDX (see prior notes for more details) - Please monitor strict I&Os and daily weights -  Lasix initially 40 IV TID- > IV Lasix 40mg  daily; if renal function worsens and breathing improves will drop to 20 mg IV Daily until able to tolerate PO - GDMT use is limited by pregnancy (see prior notes for more details) - Increasing Hydralazine to 25 mg TID  for elevated BP; because we are using leg BPS there may be slight variance   DVT Hyperemesis gravida at 25w gestation Hypoalbuminemia Severe protein calorie malnutrition  Chromic normocytic anemia  - albumin has been 1.5-2.7 ranges since June 2022 , pre-albumin 5.8 on 12/17/20  - on Lovenox for DVT - poorly controlled hyperemesis gravida required multiple hospitalization  - Continue management per primary team and OB    For questions or updates, please contact [facility] Please consult [email or web address] for contact info under        Signed, 12/19/20, MD  12/21/2020, 9:07 AM

## 2020-12-22 ENCOUNTER — Inpatient Hospital Stay (HOSPITAL_BASED_OUTPATIENT_CLINIC_OR_DEPARTMENT_OTHER): Payer: Medicaid Other

## 2020-12-22 DIAGNOSIS — Z3A26 26 weeks gestation of pregnancy: Secondary | ICD-10-CM

## 2020-12-22 DIAGNOSIS — Z362 Encounter for other antenatal screening follow-up: Secondary | ICD-10-CM | POA: Diagnosis not present

## 2020-12-22 DIAGNOSIS — D649 Anemia, unspecified: Secondary | ICD-10-CM

## 2020-12-22 DIAGNOSIS — O99012 Anemia complicating pregnancy, second trimester: Secondary | ICD-10-CM | POA: Diagnosis not present

## 2020-12-22 DIAGNOSIS — E109 Type 1 diabetes mellitus without complications: Secondary | ICD-10-CM | POA: Diagnosis not present

## 2020-12-22 DIAGNOSIS — I5041 Acute combined systolic (congestive) and diastolic (congestive) heart failure: Secondary | ICD-10-CM

## 2020-12-22 DIAGNOSIS — O24012 Pre-existing diabetes mellitus, type 1, in pregnancy, second trimester: Secondary | ICD-10-CM | POA: Diagnosis not present

## 2020-12-22 LAB — GLUCOSE, CAPILLARY
Glucose-Capillary: 100 mg/dL — ABNORMAL HIGH (ref 70–99)
Glucose-Capillary: 100 mg/dL — ABNORMAL HIGH (ref 70–99)
Glucose-Capillary: 101 mg/dL — ABNORMAL HIGH (ref 70–99)
Glucose-Capillary: 103 mg/dL — ABNORMAL HIGH (ref 70–99)
Glucose-Capillary: 109 mg/dL — ABNORMAL HIGH (ref 70–99)
Glucose-Capillary: 110 mg/dL — ABNORMAL HIGH (ref 70–99)
Glucose-Capillary: 112 mg/dL — ABNORMAL HIGH (ref 70–99)
Glucose-Capillary: 112 mg/dL — ABNORMAL HIGH (ref 70–99)
Glucose-Capillary: 114 mg/dL — ABNORMAL HIGH (ref 70–99)
Glucose-Capillary: 122 mg/dL — ABNORMAL HIGH (ref 70–99)
Glucose-Capillary: 123 mg/dL — ABNORMAL HIGH (ref 70–99)
Glucose-Capillary: 123 mg/dL — ABNORMAL HIGH (ref 70–99)
Glucose-Capillary: 154 mg/dL — ABNORMAL HIGH (ref 70–99)
Glucose-Capillary: 70 mg/dL (ref 70–99)
Glucose-Capillary: 71 mg/dL (ref 70–99)
Glucose-Capillary: 82 mg/dL (ref 70–99)
Glucose-Capillary: 88 mg/dL (ref 70–99)
Glucose-Capillary: 90 mg/dL (ref 70–99)
Glucose-Capillary: 95 mg/dL (ref 70–99)
Glucose-Capillary: 96 mg/dL (ref 70–99)
Glucose-Capillary: 97 mg/dL (ref 70–99)

## 2020-12-22 LAB — COMPREHENSIVE METABOLIC PANEL
ALT: 17 U/L (ref 0–44)
AST: 26 U/L (ref 15–41)
Albumin: 2.5 g/dL — ABNORMAL LOW (ref 3.5–5.0)
Alkaline Phosphatase: 56 U/L (ref 38–126)
Anion gap: 7 (ref 5–15)
BUN: 25 mg/dL — ABNORMAL HIGH (ref 6–20)
CO2: 29 mmol/L (ref 22–32)
Calcium: 7.3 mg/dL — ABNORMAL LOW (ref 8.9–10.3)
Chloride: 103 mmol/L (ref 98–111)
Creatinine, Ser: 1.16 mg/dL — ABNORMAL HIGH (ref 0.44–1.00)
GFR, Estimated: >60 mL/min (ref >60)
Glucose, Bld: 108 mg/dL — ABNORMAL HIGH (ref 70–99)
Potassium: 3.8 mmol/L (ref 3.5–5.1)
Sodium: 139 mmol/L (ref 135–145)
Total Bilirubin: 0.7 mg/dL (ref 0.3–1.2)
Total Protein: 4.8 g/dL — ABNORMAL LOW (ref 6.5–8.1)

## 2020-12-22 LAB — MAGNESIUM: Magnesium: 1.9 mg/dL (ref 1.7–2.4)

## 2020-12-22 LAB — PHOSPHORUS: Phosphorus: 4 mg/dL (ref 2.5–4.6)

## 2020-12-22 MED ORDER — MIRTAZAPINE 15 MG PO TBDP
15.0000 mg | ORAL_TABLET | Freq: Once | ORAL | Status: AC
  Administered 2020-12-22: 15 mg via ORAL
  Filled 2020-12-22: qty 1

## 2020-12-22 MED ORDER — POTASSIUM CHLORIDE 20 MEQ PO PACK
20.0000 meq | PACK | Freq: Once | ORAL | Status: AC
  Administered 2020-12-22: 20 meq
  Filled 2020-12-22: qty 1

## 2020-12-22 MED ORDER — MIRTAZAPINE 15 MG PO TABS: 15.0000 mg | ORAL_TABLET | Freq: Once | ORAL | Status: DC

## 2020-12-22 MED ORDER — MAGNESIUM SULFATE IN D5W 1-5 GM/100ML-% IV SOLN
1.0000 g | Freq: Once | INTRAVENOUS | Status: AC
  Administered 2020-12-22: 1 g via INTRAVENOUS
  Filled 2020-12-22: qty 100

## 2020-12-22 NOTE — Progress Notes (Signed)
Progress Note  Patient Name: Ashley Camacho Date of Encounter: 12/22/2020  [facility] Cardiologist: None   Subjective   Feeling okay.  Denies shortness of breath.  Inpatient Medications    Scheduled Meds:  bacitracin   Topical BID   Chlorhexidine Gluconate Cloth  6 each Topical Daily   enoxaparin  70 mg Subcutaneous Q12H   feeding supplement (PROSource TF)  45 mL Per Tube BID   ferrous sulfate  300 mg Per Tube Q breakfast   folic acid  1 mg Intravenous Daily   furosemide  40 mg Intravenous Daily   hydrALAZINE  25 mg Per Tube Q8H   mouth rinse  15 mL Mouth Rinse q12n4p   metoCLOPramide (REGLAN) injection  5 mg Intravenous Q6H   pantoprazole (PROTONIX) IV  40 mg Intravenous Q12H   prenatal multivitamin  1 tablet Per Tube Q1200   scopolamine  1 patch Transdermal Q72H   sodium chloride flush  10-40 mL Intracatheter Q12H   sodium chloride flush  3 mL Intravenous Q12H   Continuous Infusions:  sodium chloride Stopped (12/19/20 1700)   feeding supplement (OSMOLITE 1.5 CAL) 1,000 mL (12/20/20 1535)   insulin 0.9 Units/hr (12/22/20 0528)   lactated ringers 10 mL/hr at 12/18/20 1811   promethazine (PHENERGAN)25mg  in 28mL NS 25 mg (12/22/20 0101)   thiamine injection 500 mg (12/21/20 1423)   PRN Meds: sodium chloride, acetaminophen **OR** acetaminophen, dextrose, guaiFENesin-dextromethorphan, loperamide, ondansetron, phenol, prochlorperazine, promethazine (PHENERGAN)25mg  in 94mL NS, sodium chloride flush, sodium chloride flush   Vital Signs    Vitals:   12/21/20 2327 12/22/20 0230 12/22/20 0607 12/22/20 0823  BP: 140/65 139/62 135/63 131/61  Pulse: (!) 102 (!) 105  99  Resp: 20 20  16   Temp: 97.8 F (36.6 C) 97.6 F (36.4 C)  97.6 F (36.4 C)  TempSrc: Oral Oral  Oral  SpO2: 92% 96%  96%  Weight:      Height:        Intake/Output Summary (Last 24 hours) at 12/22/2020 0842 Last data filed at 12/22/2020 0800 Gross per 24 hour  Intake 2219.48 ml  Output 950 ml  Net  1269.48 ml   Last 3 Weights 12/21/2020 12/20/2020 12/19/2020  Weight (lbs) 153 lb 0.6 oz 149 lb 150 lb 12 oz  Weight (kg) 69.418 kg 67.586 kg 68.38 kg      Telemetry    SR/ST - Personally Reviewed  ECG    N/a - Personally Reviewed  Physical Exam   VS:  BP 131/61 (BP Location: Right Leg)   Pulse 99   Temp 97.6 F (36.4 C) (Oral)   Resp 16   Ht 5\' 1"  (1.549 m)   Wt 69.4 kg   LMP  (LMP Unknown)   SpO2 96%   BMI 28.92 kg/m  , BMI Body mass index is 28.92 kg/m. GENERAL:  Flat affect.   HEENT: Pupils equal round and reactive, fundi not visualized, oral mucosa unremarkable NECK:  JVP 8 mmHg. Waveform within normal limits, carotid upstroke brisk and symmetric, no bruits LUNGS:  Faint crackles at base HEART:  RRR.  PMI not displaced or sustained,S1 and S2 within normal limits, no S3, no S4, no clicks, no rubs, no murmurs ABD:  Flat, positive bowel sounds normal in frequency in pitch, no bruits, no rebound, no guarding, no midline pulsatile mass, no hepatomegaly, no splenomegaly EXT:  2 plus pulses throughout, 2+ LE edema, no cyanosis no clubbing SKIN:  No rashes no nodules NEURO:  Cranial  nerves II through XII grossly intact, motor grossly intact throughout PSYCH:  Cognitively intact, oriented to person place and time   Labs    High Sensitivity Troponin:  No results for input(s): TROPONINIHS in the last 720 hours.    Chemistry Recent Labs  Lab 12/20/20 0430 12/21/20 0550 12/22/20 0625  NA 139 140 139  K 3.9 4.2 3.8  CL 103 103 103  CO2 29 29 29   GLUCOSE 122* 112* 108*  BUN 24* 22* 25*  CREATININE 1.16* 1.14* 1.16*  CALCIUM 7.6* 7.3* 7.3*  PROT 5.2* 5.2* 4.8*  ALBUMIN 2.8* 2.6* 2.5*  AST 23 27 26   ALT 11 17 17   ALKPHOS 71 60 56  BILITOT 0.7 0.7 0.7  GFRNONAA >60 >60 >60  ANIONGAP 7 8 7      Hematology Recent Labs  Lab 12/18/20 0406 12/20/20 0430 12/21/20 0550  WBC 5.1 5.9 5.7  RBC 2.82* 2.47* 2.88*  HGB 8.1* 7.1* 8.5*  HCT 24.9* 22.2* 25.9*  MCV 88.3  89.9 89.9  MCH 28.7 28.7 29.5  MCHC 32.5 32.0 32.8  RDW 15.4 15.2 15.1  PLT 186 222 232    BNP Recent Labs  Lab 12/17/20 0049  BNP 2,128.9*     DDimer No results for input(s): DDIMER in the last 168 hours.   Radiology    VAS 12/20/20 UPPER EXTREMITY VENOUS DUPLEX  Result Date: 12/20/2020 UPPER VENOUS STUDY  Patient Name:  Ashley Camacho  Date of Exam:   12/20/2020 Medical Rec #: [place]        Accession #:    12/22/2020 Date of Birth: March 02, 1991       Patient Gender: F Patient Age:   65Y Exam Location:  [facility] Procedure:      VAS [id number] UPPER EXTREMITY VENOUS DUPLEX Referring Phys: [id number] [address] ELGERGAWY --------------------------------------------------------------------------------  Indications: F/U DVT in right brachial found at [facility] 12/07/20 Anticoagulation: Eliquis. Comparison Study: No prior study at [facility]. Prior right UEV done at [facility] 12/07/20 Performing Technologist: 12/09/20 RVS  Examination Guidelines: A complete evaluation includes B-mode imaging, spectral Doppler, color Doppler, and power Doppler as needed of all accessible portions of each vessel. Bilateral testing is considered an integral part of a complete examination. Limited examinations for reoccurring indications may be performed as noted.  Right Findings: +----------+------------+---------+-----------+----------+---------------+ RIGHT     CompressiblePhasicitySpontaneousProperties    Summary     +----------+------------+---------+-----------+----------+---------------+ IJV           Full       Yes       Yes                              +----------+------------+---------+-----------+----------+---------------+ Subclavian               Yes       Yes                              +----------+------------+---------+-----------+----------+---------------+ Axillary                 Yes       Yes                              +----------+------------+---------+-----------+----------+---------------+  Brachial      None       No        No  Chronic     +----------+------------+---------+-----------+----------+---------------+ Radial        Full                                                  +----------+------------+---------+-----------+----------+---------------+ Ulnar                                               patent by color +----------+------------+---------+-----------+----------+---------------+ Cephalic    Partial                                     Chronic     +----------+------------+---------+-----------+----------+---------------+ Basilic       Full                                                  +----------+------------+---------+-----------+----------+---------------+  Left Findings: +----------+------------+---------+-----------+----------+-------+ LEFT      CompressiblePhasicitySpontaneousPropertiesSummary +----------+------------+---------+-----------+----------+-------+ Subclavian               Yes       Yes                      +----------+------------+---------+-----------+----------+-------+  Summary:  Right: Findings consistent with acute superficial vein thrombosis involving the right cephalic vein. 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  Left: No evidence of thrombosis in the subclavian.  *See table(s) above for measurements and observations.  Diagnosing physician: Waverly Ferrari MD Electronically signed by Waverly Ferrari MD on 12/20/2020 at 6:45:02 PM.    Final     Cardiac Studies   Echo 12/17/20: 1. Left ventricular ejection fraction, by estimation, is 40 to 45%. The  left ventricle has mildly decreased function. The left ventricle  demonstrates global hypokinesis. Left ventricular diastolic parameters are  indeterminate.   2. Right ventricular systolic function is normal. The right ventricular  size is mildly enlarged. There is normal pulmonary  artery systolic  pressure. The estimated right ventricular systolic pressure is 29.2 mmHg.   3. Right atrial size was mildly dilated.   4. A small pericardial effusion is present.   5. The mitral valve is normal in structure. Mild to moderate mitral valve  regurgitation.   6. The aortic valve is tricuspid. Aortic valve regurgitation is not  visualized. No aortic stenosis is present.   7. The inferior vena cava is normal in size with greater than 50%  respiratory variability, suggesting right atrial pressure of 3 mmHg.   Patient Profile     30 y.o. female [redacted] weeks pregnant with type 1 diabetes, anemia, and acute systolic and diastolic heart failure.  Previously treated at [facility] for sepsis and PNA.  Assessment & Plan    #Acute systolic and also heart failure: LVEF 40 to 45%.  Etiology unclear.  Occurred too early for peripartum cardiomyopathy.  Given her hyperemesis gravidarum it is thought that nutritional deficiency is most likely diagnosis.  No ischemic symptoms.  Yesterday she was +1 L.  She has faint crackles on exam and lower extremity edema.  However, I am concerned that she may be intravascularly volume depleted still.  BUN continues to rise and creatinine is above her baseline.  We will hold off on additional Lasix today.  Continue with just once a day dosing.  If her renal function permits, will consider an additional dose tomorrow.  She has been receiving albumin.  # Hypertension:  BP stable on hydralazine.      For questions or updates, please contact [facility] Please consult [email or web address] for contact info under        Signed, Chilton Si, MD  12/22/2020, 8:42 AM

## 2020-12-22 NOTE — Progress Notes (Signed)
Patient ID: Ashley Camacho, female   DOB: August 11, 1990, 30 y.o.   MRN: [id number] ACULTY PRACTICE ANTEPARTUM COMPREHENSIVE PROGRESS NOTE  Ashley Camacho is a 30 y.o. G2P0101 at [redacted]w[redacted]d  who is admitted to IM service for complications of DM in setting of IUP.  Fetal presentation is unsure. Length of Stay:  6  Days  Subjective: Pt reports feeling tired this morning.  Patient reports good fetal movement.  She reports no uterine contractions, no bleeding and no loss of fluid per vagina.  Vitals:  Blood pressure 131/61, pulse 99, temperature 97.6 F (36.4 C), temperature source Oral, resp. rate 16, height 5\' 1"  (1.549 m), weight 69.4 kg, SpO2 96 %. Physical Examination: Lungs clear Heart RRR Abd soft + BS gravid Ext non tender  Fetal Monitoring:  NST pending  Labs:  Results for orders placed or performed during the hospital encounter of 12/16/20 (from the past 24 hour(s))  Glucose, capillary   Collection Time: 12/21/20 11:02 AM  Result Value Ref Range   Glucose-Capillary 142 (H) 70 - 99 mg/dL  Glucose, capillary   Collection Time: 12/21/20 12:07 PM  Result Value Ref Range   Glucose-Capillary 145 (H) 70 - 99 mg/dL  Glucose, capillary   Collection Time: 12/21/20  1:05 PM  Result Value Ref Range   Glucose-Capillary 129 (H) 70 - 99 mg/dL  Glucose, capillary   Collection Time: 12/21/20  2:09 PM  Result Value Ref Range   Glucose-Capillary 127 (H) 70 - 99 mg/dL  Glucose, capillary   Collection Time: 12/21/20  3:23 PM  Result Value Ref Range   Glucose-Capillary 109 (H) 70 - 99 mg/dL  Glucose, capillary   Collection Time: 12/21/20  4:24 PM  Result Value Ref Range   Glucose-Capillary 91 70 - 99 mg/dL  Glucose, capillary   Collection Time: 12/21/20  5:27 PM  Result Value Ref Range   Glucose-Capillary 118 (H) 70 - 99 mg/dL  Glucose, capillary   Collection Time: 12/21/20  7:28 PM  Result Value Ref Range   Glucose-Capillary 110 (H) 70 - 99 mg/dL  Glucose, capillary   Collection  Time: 12/21/20  9:35 PM  Result Value Ref Range   Glucose-Capillary 101 (H) 70 - 99 mg/dL  Glucose, capillary   Collection Time: 12/21/20 11:35 PM  Result Value Ref Range   Glucose-Capillary 145 (H) 70 - 99 mg/dL  Glucose, capillary   Collection Time: 12/22/20 12:38 AM  Result Value Ref Range   Glucose-Capillary 154 (H) 70 - 99 mg/dL  Glucose, capillary   Collection Time: 12/22/20  1:34 AM  Result Value Ref Range   Glucose-Capillary 123 (H) 70 - 99 mg/dL  Glucose, capillary   Collection Time: 12/22/20  2:21 AM  Result Value Ref Range   Glucose-Capillary 103 (H) 70 - 99 mg/dL  Glucose, capillary   Collection Time: 12/22/20  3:01 AM  Result Value Ref Range   Glucose-Capillary 100 (H) 70 - 99 mg/dL  Glucose, capillary   Collection Time: 12/22/20  3:29 AM  Result Value Ref Range   Glucose-Capillary 88 70 - 99 mg/dL  Glucose, capillary   Collection Time: 12/22/20  4:31 AM  Result Value Ref Range   Glucose-Capillary 97 70 - 99 mg/dL  Glucose, capillary   Collection Time: 12/22/20  5:26 AM  Result Value Ref Range   Glucose-Capillary 90 70 - 99 mg/dL  Comprehensive metabolic panel   Collection Time: 12/22/20  6:25 AM  Result Value Ref Range   Sodium 139 135 -  145 mmol/L   Potassium 3.8 3.5 - 5.1 mmol/L   Chloride 103 98 - 111 mmol/L   CO2 29 22 - 32 mmol/L   Glucose, Bld 108 (H) 70 - 99 mg/dL   BUN 25 (H) 6 - 20 mg/dL   Creatinine, Ser 2.87 (H) 0.44 - 1.00 mg/dL   Calcium 7.3 (L) 8.9 - 10.3 mg/dL   Total Protein 4.8 (L) 6.5 - 8.1 g/dL   Albumin 2.5 (L) 3.5 - 5.0 g/dL   AST 26 15 - 41 U/L   ALT 17 0 - 44 U/L   Alkaline Phosphatase 56 38 - 126 U/L   Total Bilirubin 0.7 0.3 - 1.2 mg/dL   GFR, Estimated >86 >76 mL/min   Anion gap 7 5 - 15  Phosphorus   Collection Time: 12/22/20  6:25 AM  Result Value Ref Range   Phosphorus 4.0 2.5 - 4.6 mg/dL  Magnesium   Collection Time: 12/22/20  6:25 AM  Result Value Ref Range   Magnesium 1.9 1.7 - 2.4 mg/dL  Glucose, capillary    Collection Time: 12/22/20  6:29 AM  Result Value Ref Range   Glucose-Capillary 112 (H) 70 - 99 mg/dL  Glucose, capillary   Collection Time: 12/22/20  8:43 AM  Result Value Ref Range   Glucose-Capillary 82 70 - 99 mg/dL  Glucose, capillary   Collection Time: 12/22/20  9:50 AM  Result Value Ref Range   Glucose-Capillary 112 (H) 70 - 99 mg/dL    Imaging Studies:    Growth scan today   Medications:  Scheduled  bacitracin   Topical BID   Chlorhexidine Gluconate Cloth  6 each Topical Daily   enoxaparin  70 mg Subcutaneous Q12H   feeding supplement (PROSource TF)  45 mL Per Tube BID   ferrous sulfate  300 mg Per Tube Q breakfast   folic acid  1 mg Intravenous Daily   furosemide  40 mg Intravenous Daily   hydrALAZINE  25 mg Per Tube Q8H   mouth rinse  15 mL Mouth Rinse q12n4p   metoCLOPramide (REGLAN) injection  5 mg Intravenous Q6H   pantoprazole (PROTONIX) IV  40 mg Intravenous Q12H   prenatal multivitamin  1 tablet Per Tube Q1200   scopolamine  1 patch Transdermal Q72H   sodium chloride flush  10-40 mL Intracatheter Q12H   sodium chloride flush  3 mL Intravenous Q12H   I have reviewed the patient's current medications.  ASSESSMENT & Plan  Stable form an OB stand pont. Growth scan today. Fetal well being reassuring. IM and Cardiology managing other medical issues.   Ashley Camacho 12/22/2020,10:54 AM

## 2020-12-22 NOTE — Progress Notes (Signed)
ANTICOAGULATION CONSULT NOTE - Follow Up Consult  Pharmacy Consult for Lovenox Indication:  VTE treatment for right brachial DVT  No Known Allergies  Patient Measurements: Height: 5\' 1"  (154.9 cm) Weight: 69.4 kg (153 lb 0.6 oz) IBW/kg (Calculated) : 47.8 Heparin Dosing Weight: 70.2 kg  Vital Signs: Temp: 97.6 F (36.4 C) (08/01 0823) Temp Source: Oral (08/01 0823) BP: 131/61 (08/01 0823) Pulse Rate: 99 (08/01 0823)  Labs: Recent Labs    12/19/20 1520 12/20/20 0430 12/21/20 0550 12/22/20 0625  HGB  --  7.1* 8.5*  --   HCT  --  22.2* 25.9*  --   PLT  --  222 232  --   HEPRLOWMOCWT 0.76  --   --   --   CREATININE  --  1.16* 1.14* 1.16*    Estimated Creatinine Clearance: 63.7 mL/min (A) (by C-G formula based on SCr of 1.16 mg/dL (H)).  Assessment: 29 YOF at [redacted] weeks gestation currently receiving full dose enoxaparin for recent right brachial DVT. Last anti-Xa level = 0.76 (7/29) within target range of 0.6-1. No significant changes in weight, PLTs or renal function. Will continue current regimen.  Goal of Therapy:  Anti-Xa level 0.6-1 units/ml 4hrs after LMWH dose given Monitor platelets by anticoagulation protocol: Yes   Plan:  Continue Lovenox 70 mg SQ BID  09-23-1981, PharmD, MHSA, BCPPS 12/22/2020,11:50 AM

## 2020-12-22 NOTE — Progress Notes (Addendum)
Inpatient Diabetes Program Recommendations  AACE/ADA: New Consensus Statement on Inpatient Glycemic Control (2015)  Target Ranges:  Prepandial:   less than 140 mg/dL      Peak postprandial:   less than 180 mg/dL (1-2 hours)      Critically ill patients:  140 - 180 mg/dL   Lab Results  Component Value Date   GLUCAP 109 (H) 12/22/2020   HGBA1C 5.8 (H) 11/21/2020    Review of Glycemic Control Results for ORLY, QUIMBY (MRN [id number]) as of 12/22/2020 12:22  Ref. Range 12/22/2020 08:43 12/22/2020 09:50 12/22/2020 10:57 12/22/2020 12:04  Glucose-Capillary Latest Ref Range: 70 - 99 mg/dL 82 112 (H) 114 (H) 109 (H)  Diabetes history: Type 1 DM Current orders for Inpatient glycemic control: IV insulin Osmolite @ 50 ml/hr TPN- stopped Steroids- Solumedrol/Decadron taper- completed 7/31   Inpatient Diabetes Program Recommendations:    Continue with IV insulin.  Please do not transition off insulin drip without discussing with Diabetes Coordinator and pharmacy to make sure that insulin needs are covered adequately. Following.  Met with patient to assess for improvements to nausea/vomiting. Patient reports " I feel about the same". Discussed Freestyle Libre with Dr Rip Harbour. Secure chat sent to Dr Waldron [facility]. Seems that this tool may help to save finger sticks now that albumin slightly improved. Could compare values for 24 hours to evaluate data. Awaiting orders.  Addendum: Freestyle Libre applied to left upper arm _0 .  Thanks, Bronson Curb, MSN, RNC-OB Diabetes Coordinator [phone] (8a-5p)

## 2020-12-22 NOTE — Progress Notes (Signed)
PROGRESS NOTE    Ashley Camacho  XTK:[id number] DOB: 1990-11-14 DOA: 12/16/2020 PCP: Hildred Laser, MD  [id number]   Assessment & Plan:   Active Problems:   Anemia affecting pregnancy in second trimester   AKI (acute kidney injury) (HCC)   Hyperemesis   Pregnancy with 24 completed weeks gestation   DKA, type 1, not at goal [facility])   Starvation ketoacidosis   Acute deep vein thrombosis (DVT) of brachial vein of right upper extremity (HCC)   Acute respiratory failure with hypoxia (HCC)   Vitreous hemorrhage of right eye (HCC)   Gastroparesis due to DM (HCC)   Diabetic retinopathy (HCC)   Acute on chronic combined systolic and diastolic CHF (congestive heart failure) (HCC)   Type 1 diabetes mellitus affecting pregnancy in second trimester, antepartum   HFrEF (heart failure with reduced ejection fraction) (HCC)   Patient is a 30 year old female G2,P1 at [redacted] weeks gestation who has a history of type 1 diabetes mellitus.  She presented to the emergency room for evaluation of persistent nausea and vomiting .  She was recently with multiple hospitalization, at [facility] for sepsis secondary to pneumonia and was there for 2 weeks.  Couple hospitalization at [facility] due to intractable nausea and vomiting. -Transferred from [facility] to [facility] 7/26 given her severe malnutrition, with hyperemesis gravidarum, for consideration of cortrak tube insertion.    Severe protein calorie malnutrition/hyperemesis gravidarum -Patient unable to have an oral intake for some time now, she is started on TPN, core track tube was inserted 7/27, she is started on tube feed managed by nutritionist, so far tolerating 50 cc/h.  TPN has been discontinued 7/28. -She remains with significant nausea and vomiting, but this is at her baseline over her last few weeks, does not appear to be worsening after starting her tube feed. . - she is with known history of brittle diabetes, her A1c was 5.8 11/21/2020 with  known history of diabetes with polyneuropathy and gastroparesis -Added Reglan with underlying history of gastroparesis. -She is on steroid hyperemesis gravidarum protocol, it seemed to help her symptoms, so we will discontinue today.   -She did receive albumin. -We will monitor closely for refeeding syndrome.  Phosphorus, magnesium, BMP daily. -Discontinued Remeron 7/21 given prolonged QTC. -Continue to monitor QTC closely, it does appear to be improving into 430s range today, continue to hold Remeron.  Type 1 diabetes mellitus insulin-dependent, uncontrolled with hypoglycemia. -Given patient oral intake is unreliable, and her CBG level fluctuating significantly, she will be kept on insulin drip for now.  Hyperemesis gravidarum -See above discussion  Cardiomyopathy/acute systolic heart failure 2D echocardiogram shows an LVEF of 45% -With lower extremity edema, likely combination of both CHF exacerbation and third spacing from hypoalbuminemia -Received IV albumin initially. -Diuresis management per cardiology, she still appears to be in volume overload, diuresis is limited by worsening renal function and BUN. - cardiology  input greatly appreciated, currently on hydralazine for blood pressure control -Possibly nutritional deficits contributing to heart failure continue with IV thiamine and folic acid.  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   -on full dose Lovenox.    Urinary retention -Patient presents with Foley catheter inserted at [facility], this has been discontinued on admission. -She remains with recurrent urinary retention, encouraged to ambulate.   Hypomagnesemia -Repleted  Normocytic anemia -In  the setting of pregnancy, she was transfused 1 unit PRBC on 7/27.  1 unit on 7/29.    Prolonged  QTC -Moved after stopping Remeron and changing Zofran to as needed.   - keep potassium> 4, magnesium> 2 -Improving QTC 430 today.  Did order topical antibiotic for site of right foot small wound due to sunburn.    DVT prophylaxis: GY:[id number] dose lovenox Code Status: Full code  Family Communication: None at bedside Level of care: Antepartum Dispo:   The patient is from: transfer from [facility]  Remains inpatient appropriate because:Inpatient level of care appropriate due to severity of illness   Dispo: The patient is from: [facility]              Anticipated d/c is to: Home              Patient currently is not medically stable to d/c.              Difficult to place patient No   Subjective :  With intermittent urine retention, able to urinate twice yesterday on her own, reports poor night sleep due to vomiting.   Objective: Vitals:   12/21/20 2327 12/22/20 0230 12/22/20 0607 12/22/20 0823  BP: 140/65 139/62 135/63 131/61  Pulse: (!) 102 (!) 105  99  Resp: 20 20  16   Temp: 97.8 F (36.6 C) 97.6 F (36.4 C)  97.6 F (36.4 C)  TempSrc: Oral Oral  Oral  SpO2: 92% 96%  96%  Weight:      Height:        Intake/Output Summary (Last 24 hours) at 12/22/2020 1147 Last data filed at 12/22/2020 1027 Gross per 24 hour  Intake 2151.25 ml  Output 1250 ml  Net 901.25 ml   Filed Weights   12/19/20 0436 12/20/20 0602 12/21/20 0613  Weight: 68.4 kg 67.6 kg 69.4 kg     Examination:   Awake Alert, Oriented X 3, well, ill-appearing  symmetrical Chest wall movement, Good air movement bilaterally, bibasilar crackles RRR,No Gallops,Rubs or new Murmurs, No Parasternal Heave +ve B.Sounds, Abd Soft,  No Cyanosis, +1 edema     Data Reviewed: I have personally reviewed following labs and imaging studies  CBC: Recent Labs  Lab 12/17/20 0049 12/17/20 1600 12/18/20 0406 12/20/20 0430 12/21/20 0550  WBC 6.1 5.7 5.1 5.9 5.7  NEUTROABS 4.3  --   --   --   --   HGB 7.5* 7.0* 8.1* 7.1*  8.5*  HCT 22.3* 21.2* 24.9* 22.2* 25.9*  MCV 89.9 90.2 88.3 89.9 89.9  PLT 177 198 186 222 232   Basic Metabolic Panel: Recent Labs  Lab 12/18/20 0406 12/19/20 0313 12/20/20 0430 12/21/20 0550 12/22/20 0625  NA 137 138 139 140 139  K 3.6 3.9 3.9 4.2 3.8  CL 99 104 103 103 103  CO2 30 29 29 29 29   GLUCOSE 122* 129* 122* 112* 108*  BUN 13 20 24* 22* 25*  CREATININE 1.06* 1.12* 1.16* 1.14* 1.16*  CALCIUM 8.4* 7.9* 7.6* 7.3* 7.3*  MG 2.0 2.0 2.0 2.0 1.9  PHOS 4.2 2.8 3.0 3.7 4.0   GFR: Estimated Creatinine Clearance: 63.7 mL/min (A) (by C-G formula based on SCr of 1.16 mg/dL (H)). Liver Function Tests: Recent Labs  Lab 12/18/20 0406 12/19/20 0313 12/20/20 0430 12/21/20 0550 12/22/20 0625  AST 15 17 23 27 26   ALT 8 9 11 17 17   ALKPHOS 67 59 71 60 56  BILITOT 1.4* 1.0 0.7 0.7 0.7  PROT  5.0* 5.2* 5.2* 5.2* 4.8*  ALBUMIN 2.7* 2.7* 2.8* 2.6* 2.5*   No results for input(s): LIPASE, AMYLASE in the last 168 hours. No results for input(s): AMMONIA in the last 168 hours. Coagulation Profile: No results for input(s): INR, PROTIME in the last 168 hours. Cardiac Enzymes: Recent Labs  Lab 12/17/20 0049  CKTOTAL 54  55   BNP (last 3 results) No results for input(s): PROBNP in the last 8760 hours. HbA1C: No results for input(s): HGBA1C in the last 72 hours. CBG: Recent Labs  Lab 12/22/20 0526 12/22/20 0629 12/22/20 0843 12/22/20 0950 12/22/20 1057  GLUCAP 90 112* 82 112* 114*   Lipid Profile: Recent Labs    12/21/20 0550  CHOL 115  HDL 25*  LDLCALC 69  TRIG 676  CHOLHDL 4.6    Thyroid Function Tests: No results for input(s): TSH, T4TOTAL, FREET4, T3FREE, THYROIDAB in the last 72 hours.  Anemia Panel: No results for input(s): VITAMINB12, FOLATE, FERRITIN, TIBC, IRON, RETICCTPCT in the last 72 hours.  Sepsis Labs: No results for input(s): PROCALCITON, LATICACIDVEN in the last 168 hours.  Recent Results (from the past 240 hour(s))  Resp Panel by RT-PCR  (Flu A&B, Covid) Nasopharyngeal Swab     Status: None   Collection Time: 12/13/20  1:48 AM   Specimen: Nasopharyngeal Swab; Nasopharyngeal(NP) swabs in vial transport medium  Result Value Ref Range Status   SARS Coronavirus 2 by RT PCR NEGATIVE NEGATIVE Final    Comment: (NOTE) SARS-CoV-2 target nucleic acids are NOT DETECTED.  The SARS-CoV-2 RNA is generally detectable in upper respiratory specimens during the acute phase of infection. The lowest concentration of SARS-CoV-2 viral copies this assay can detect is 138 copies/mL. A negative result does not preclude SARS-Cov-2 infection and should not be used as the sole basis for treatment or other patient management decisions. A negative result may occur with  improper specimen collection/handling, submission of specimen other than nasopharyngeal swab, presence of viral mutation(s) within the areas targeted by this assay, and inadequate number of viral copies(<138 copies/mL). A negative result must be combined with clinical observations, patient history, and epidemiological information. The expected result is Negative.  Fact Sheet for Patients:  BloggerCourse.com  Fact Sheet for Healthcare Providers:  SeriousBroker.it  This test is no t yet approved or cleared by the [place] FDA and  has been authorized for detection and/or diagnosis of SARS-CoV-2 by FDA under an Emergency Use Authorization (EUA). This EUA will remain  in effect (meaning this test can be used) for the duration of the COVID-19 declaration under Section 564(b)(1) of the Act, 21 U.S.C.section 360bbb-3(b)(1), unless the authorization is terminated  or revoked sooner.       Influenza A by PCR NEGATIVE NEGATIVE Final   Influenza B by PCR NEGATIVE NEGATIVE Final    Comment: (NOTE) The Xpert Xpress SARS-CoV-2/FLU/RSV plus assay is intended as an aid in the diagnosis of influenza from Nasopharyngeal swab specimens  and should not be used as a sole basis for treatment. Nasal washings and aspirates are unacceptable for Xpert Xpress SARS-CoV-2/FLU/RSV testing.  Fact Sheet for Patients: BloggerCourse.com  Fact Sheet for Healthcare Providers: SeriousBroker.it  This test is not yet approved or cleared by the [place] FDA and has been authorized for detection and/or diagnosis of SARS-CoV-2 by FDA under an Emergency Use Authorization (EUA). This EUA will remain in effect (meaning this test can be used) for the duration of the COVID-19 declaration under Section 564(b)(1) of the Act, 21 U.S.C. section  360bbb-3(b)(1), unless the authorization is terminated or revoked.  Performed at [facility], [address] [place], [place] [address]   MRSA Next Gen by PCR, Nasal     Status: None   Collection Time: 12/15/20  9:20 PM   Specimen: Nasal Mucosa; Nasal Swab  Result Value Ref Range Status   MRSA by PCR Next Gen NOT DETECTED NOT DETECTED Final    Comment: (NOTE) The GeneXpert MRSA Assay (FDA approved for NASAL specimens only), is one component of a comprehensive MRSA colonization surveillance program. It is not intended to diagnose MRSA infection nor to guide or monitor treatment for MRSA infections. Test performance is not FDA approved in patients less than 14 years old. Performed at [facility], [address] [place], [place] [address]   [facility] Urine Culture     Status: Abnormal   Collection Time: 12/20/20  6:29 AM   Specimen: OB Catheterized; Urine  Result Value Ref Range Status   Specimen Description OB CATHETERIZED  Final   Special Requests NONE  Final   Culture (A)  Final    <10,000 COLONIES/mL INSIGNIFICANT GROWTH NO GROUP B STREP (S.AGALACTIAE) ISOLATED Performed at [facility], [address], [place], [place] [address]    Report Status 12/21/2020 FINAL  Final  Wet prep, genital     Status: None    Collection Time: 12/20/20  5:41 PM   Specimen: Vaginal  Result Value Ref Range Status   Yeast Wet Prep HPF POC NONE SEEN NONE SEEN Final   Trich, Wet Prep NONE SEEN NONE SEEN Final   Clue Cells Wet Prep HPF POC NONE SEEN NONE SEEN Final   WBC, Wet Prep HPF POC NONE SEEN NONE SEEN Final   Sperm NONE SEEN  Final    Comment: Performed at [facility], [address] [place], [place] [address]      Radiology Studies: VAS [place] UPPER EXTREMITY VENOUS DUPLEX  Result Date: 12/20/2020 UPPER VENOUS STUDY  Patient Name:  SHAKIYA MCNEARY  Date of Exam:   12/20/2020 Medical Rec #: [id number]        Accession #:    [id number] Date of Birth: [birth year]       Patient Gender: F Patient Age:   16Y Exam Location:  [facility] Procedure:      VAS [place] UPPER EXTREMITY VENOUS DUPLEX Referring Phys: [id number] Alip[address] S Troi Bechtold --------------------------------------------------------------------------------  Indications: F/U DVT in right brachial found at [facility] 12/07/20 Anticoagulation: Eliquis. Comparison Study: No prior study at [facility]. Prior right UEV done at [facility] 12/07/20 Performing Technologist: Sherren Kerns RVS  Examination Guidelines: A complete evaluation includes B-mode imaging, spectral Doppler, color Doppler, and power Doppler as needed of all accessible portions of each vessel. Bilateral testing is considered an integral part of a complete examination. Limited examinations for reoccurring indications may be performed as noted.  Right Findings: +----------+------------+---------+-----------+----------+---------------+ RIGHT     CompressiblePhasicitySpontaneousProperties    Summary     +----------+------------+---------+-----------+----------+---------------+ IJV           Full       Yes       Yes                              +----------+------------+---------+-----------+----------+---------------+ Subclavian               Yes       Yes                               +----------+------------+---------+-----------+----------+---------------+  Axillary                 Yes       Yes                              +----------+------------+---------+-----------+----------+---------------+ Brachial      None       No        No                   Chronic     +----------+------------+---------+-----------+----------+---------------+ Radial        Full                                                  +----------+------------+---------+-----------+----------+---------------+ Ulnar                                               patent by color +----------+------------+---------+-----------+----------+---------------+ Cephalic    Partial                                     Chronic     +----------+------------+---------+-----------+----------+---------------+ Basilic       Full                                                  +----------+------------+---------+-----------+----------+---------------+  Left Findings: +----------+------------+---------+-----------+----------+-------+ LEFT      CompressiblePhasicitySpontaneousPropertiesSummary +----------+------------+---------+-----------+----------+-------+ Subclavian               Yes       Yes                      +----------+------------+---------+-----------+----------+-------+  Summary:  Right: Findings consistent with acute superficial vein thrombosis involving the right cephalic vein. 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  Left: No evidence of thrombosis in the subclavian.  *See table(s) above for measurements and observations.  Diagnosing physician: Waverly Ferrari MD Electronically signed by Waverly Ferrari MD on 12/20/2020 at 6:45:02 PM.    Final      Scheduled Meds:  bacitracin   Topical BID   Chlorhexidine Gluconate Cloth  6 each Topical Daily   enoxaparin  70 mg Subcutaneous Q12H   feeding supplement (PROSource TF)   45 mL Per Tube BID   ferrous sulfate  300 mg Per Tube Q breakfast   folic acid  1 mg Intravenous Daily   furosemide  40 mg Intravenous Daily   hydrALAZINE  25 mg Per Tube Q8H   mouth rinse  15 mL Mouth Rinse q12n4p   metoCLOPramide (REGLAN) injection  5 mg Intravenous Q6H   pantoprazole (PROTONIX) IV  40 mg Intravenous Q12H   potassium chloride  20 mEq Per Tube Once   prenatal multivitamin  1 tablet Per Tube Q1200   scopolamine  1 patch Transdermal Q72H   sodium chloride flush  10-40 mL Intracatheter Q12H   sodium chloride flush  3 mL Intravenous Q12H  Continuous Infusions:  sodium chloride Stopped (12/19/20 1700)   feeding supplement (OSMOLITE 1.5 CAL) 1,000 mL (12/20/20 1535)   insulin 2.4 Units/hr (12/22/20 1100)   lactated ringers 10 mL/hr at 12/18/20 1811   magnesium sulfate bolus IVPB     promethazine (PHENERGAN)25mg  in 50mL NS 25 mg (12/22/20 0101)   thiamine injection 500 mg (12/21/20 1423)     LOS: 6 days     Huey Bienenstockawood Colyn Miron, MD [facility] If 7PM-7AM, please contact night-coverage 12/22/2020, 11:47 AM

## 2020-12-23 ENCOUNTER — Inpatient Hospital Stay (HOSPITAL_COMMUNITY): Payer: Medicaid Other

## 2020-12-23 LAB — GLUCOSE, CAPILLARY
Glucose-Capillary: 106 mg/dL — ABNORMAL HIGH (ref 70–99)
Glucose-Capillary: 123 mg/dL — ABNORMAL HIGH (ref 70–99)
Glucose-Capillary: 126 mg/dL — ABNORMAL HIGH (ref 70–99)
Glucose-Capillary: 65 mg/dL — ABNORMAL LOW (ref 70–99)
Glucose-Capillary: 74 mg/dL (ref 70–99)
Glucose-Capillary: 92 mg/dL (ref 70–99)
Glucose-Capillary: 93 mg/dL (ref 70–99)
Glucose-Capillary: 96 mg/dL (ref 70–99)
Glucose-Capillary: 97 mg/dL (ref 70–99)
Glucose-Capillary: 98 mg/dL (ref 70–99)

## 2020-12-23 LAB — BASIC METABOLIC PANEL
Anion gap: 9 (ref 5–15)
BUN: 27 mg/dL — ABNORMAL HIGH (ref 6–20)
CO2: 27 mmol/L (ref 22–32)
Calcium: 7.7 mg/dL — ABNORMAL LOW (ref 8.9–10.3)
Chloride: 103 mmol/L (ref 98–111)
Creatinine, Ser: 1.22 mg/dL — ABNORMAL HIGH (ref 0.44–1.00)
GFR, Estimated: >60 mL/min (ref >60)
Glucose, Bld: 98 mg/dL (ref 70–99)
Potassium: 4.4 mmol/L (ref 3.5–5.1)
Sodium: 139 mmol/L (ref 135–145)

## 2020-12-23 LAB — BRAIN NATRIURETIC PEPTIDE: B Natriuretic Peptide: 480 pg/mL — ABNORMAL HIGH (ref 0.0–100.0)

## 2020-12-23 LAB — CBC
HCT: 26.2 % — ABNORMAL LOW (ref 36.0–46.0)
Hemoglobin: 8.4 g/dL — ABNORMAL LOW (ref 12.0–15.0)
MCH: 28.9 pg (ref 26.0–34.0)
MCHC: 32.1 g/dL (ref 30.0–36.0)
MCV: 90 fL (ref 80.0–100.0)
Platelets: 261 10*3/uL (ref 150–400)
RBC: 2.91 MIL/uL — ABNORMAL LOW (ref 3.87–5.11)
RDW: 15.3 % (ref 11.5–15.5)
WBC: 6.9 10*3/uL (ref 4.0–10.5)
nRBC: 0 % (ref 0.0–0.2)

## 2020-12-23 LAB — PHOSPHORUS: Phosphorus: 5.1 mg/dL — ABNORMAL HIGH (ref 2.5–4.6)

## 2020-12-23 LAB — MAGNESIUM: Magnesium: 2.2 mg/dL (ref 1.7–2.4)

## 2020-12-23 NOTE — Progress Notes (Signed)
Chaplain acknowledges consult request. Chaplain attempted to visit pt, but pt's bed was empty and no one was in her room at the time. Will return in the morning. Please page on-call chaplain if needed before then.  Please page as further needs arise.  Maryanna Shape. Carley Hammed, M.Div. [facility] Chaplain Pager [phone] Office [phone]

## 2020-12-23 NOTE — Progress Notes (Signed)
Patient ID: Ashley Camacho, female   DOB: 1990-11-09, 30 y.o.   MRN: [id number] [facility] PR[facility]ICE ANTEPARTUM COMPREHENSIVE PROGRESS NOTE  Ashley Camacho is a 30 y.o. G2P0101 at [redacted]w[redacted]d  who is admitted to IM service for complications of DM in setting of second trimester pregnancy. Fetal presentation is breech Length of Stay:  7  Days  Subjective: No OB complaints this morning Patient reports good fetal movement.  She reports no uterine contractions, no bleeding and no loss of fluid per vagina.  Vitals:  Blood pressure 123/60, pulse (!) 101, temperature (!) 97.4 F (36.3 C), temperature source Axillary, resp. rate 18, height 5\' 1"  (1.549 m), weight 69.4 kg, SpO2 96 %. Physical Examination: Lungs clear Heart RRR Abd soft + BS gravid Ext non tender  Fetal Monitoring:  Baseline: 140's bpm  Labs:  Results for orders placed or performed during the hospital encounter of 12/16/20 (from the past 24 hour(s))  Glucose, capillary   Collection Time: 12/22/20 12:04 PM  Result Value Ref Range   Glucose-Capillary 109 (H) 70 - 99 mg/dL  Glucose, capillary   Collection Time: 12/22/20  2:10 PM  Result Value Ref Range   Glucose-Capillary 70 70 - 99 mg/dL  Glucose, capillary   Collection Time: 12/22/20  2:42 PM  Result Value Ref Range   Glucose-Capillary 71 70 - 99 mg/dL  Glucose, capillary   Collection Time: 12/22/20  3:46 PM  Result Value Ref Range   Glucose-Capillary 101 (H) 70 - 99 mg/dL  Glucose, capillary   Collection Time: 12/22/20  4:45 PM  Result Value Ref Range   Glucose-Capillary 100 (H) 70 - 99 mg/dL  Glucose, capillary   Collection Time: 12/22/20  5:45 PM  Result Value Ref Range   Glucose-Capillary 123 (H) 70 - 99 mg/dL  Glucose, capillary   Collection Time: 12/22/20  6:46 PM  Result Value Ref Range   Glucose-Capillary 110 (H) 70 - 99 mg/dL  Glucose, capillary   Collection Time: 12/22/20  7:49 PM  Result Value Ref Range   Glucose-Capillary 96 70 - 99 mg/dL  Glucose,  capillary   Collection Time: 12/22/20  9:01 PM  Result Value Ref Range   Glucose-Capillary 95 70 - 99 mg/dL  Glucose, capillary   Collection Time: 12/22/20 11:11 PM  Result Value Ref Range   Glucose-Capillary 122 (H) 70 - 99 mg/dL  Glucose, capillary   Collection Time: 12/23/20 12:19 AM  Result Value Ref Range   Glucose-Capillary 126 (H) 70 - 99 mg/dL  Glucose, capillary   Collection Time: 12/23/20  1:18 AM  Result Value Ref Range   Glucose-Capillary 97 70 - 99 mg/dL  Glucose, capillary   Collection Time: 12/23/20  2:22 AM  Result Value Ref Range   Glucose-Capillary 92 70 - 99 mg/dL  Glucose, capillary   Collection Time: 12/23/20  3:26 AM  Result Value Ref Range   Glucose-Capillary 96 70 - 99 mg/dL  Phosphorus   Collection Time: 12/23/20  5:27 AM  Result Value Ref Range   Phosphorus 5.1 (H) 2.5 - 4.6 mg/dL  CBC   Collection Time: 12/23/20  5:27 AM  Result Value Ref Range   WBC 6.9 4.0 - 10.5 K/uL   RBC 2.91 (L) 3.87 - 5.11 MIL/uL   Hemoglobin 8.4 (L) 12.0 - 15.0 g/dL   HCT 02/22/21 (L) 93.7 - 16.9 %   MCV 90.0 80.0 - 100.0 fL   MCH 28.9 26.0 - 34.0 pg   MCHC 32.1 30.0 - 36.0 g/dL  RDW 15.3 11.5 - 15.5 %   Platelets 261 150 - 400 K/uL   nRBC 0.0 0.0 - 0.2 %  Basic metabolic panel   Collection Time: 12/23/20  5:27 AM  Result Value Ref Range   Sodium 139 135 - 145 mmol/L   Potassium 4.4 3.5 - 5.1 mmol/L   Chloride 103 98 - 111 mmol/L   CO2 27 22 - 32 mmol/L   Glucose, Bld 98 70 - 99 mg/dL   BUN 27 (H) 6 - 20 mg/dL   Creatinine, Ser 2.70 (H) 0.44 - 1.00 mg/dL   Calcium 7.7 (L) 8.9 - 10.3 mg/dL   GFR, Estimated >62 >37 mL/min   Anion gap 9 5 - 15  Magnesium   Collection Time: 12/23/20  5:27 AM  Result Value Ref Range   Magnesium 2.2 1.7 - 2.4 mg/dL  Glucose, capillary   Collection Time: 12/23/20  5:33 AM  Result Value Ref Range   Glucose-Capillary 98 70 - 99 mg/dL  Glucose, capillary   Collection Time: 12/23/20  7:46 AM  Result Value Ref Range    Glucose-Capillary 93 70 - 99 mg/dL  Brain natriuretic peptide   Collection Time: 12/23/20  8:33 AM  Result Value Ref Range   B Natriuretic Peptide 480.0 (H) 0.0 - 100.0 pg/mL  Glucose, capillary   Collection Time: 12/23/20 10:07 AM  Result Value Ref Range   Glucose-Capillary 106 (H) 70 - 99 mg/dL    Imaging Studies:    Growth scan yesterday 59 % , breech, mild poly   Medications:  Scheduled  bacitracin   Topical BID   Chlorhexidine Gluconate Cloth  6 each Topical Daily   enoxaparin  70 mg Subcutaneous Q12H   feeding supplement (PROSource TF)  45 mL Per Tube BID   ferrous sulfate  300 mg Per Tube Q breakfast   folic acid  1 mg Intravenous Daily   hydrALAZINE  25 mg Per Tube Q8H   mouth rinse  15 mL Mouth Rinse q12n4p   metoCLOPramide (REGLAN) injection  5 mg Intravenous Q6H   pantoprazole (PROTONIX) IV  40 mg Intravenous Q12H   prenatal multivitamin  1 tablet Per Tube Q1200   scopolamine  1 patch Transdermal Q72H   sodium chloride flush  10-40 mL Intracatheter Q12H   sodium chloride flush  3 mL Intravenous Q12H   I have reviewed the patient's current medications.  ASSESSMENT & Plan Stable from an OB stand point. Normal growth scan yesterday. Mild poly, largest packet 8.5 cm. F/U growth in 4 weeks. Appreciate IM and Cardiology caring for pt. Management of medical issues per them.    Hermina Staggers 12/23/2020,11:50 AM

## 2020-12-23 NOTE — Progress Notes (Addendum)
Progress Note  Patient Name: Ashley Camacho Date of Encounter: 12/23/2020  [facility] Cardiologist: None   Subjective   More nausea and some emesis this am.  No SOB.   Inpatient Medications    Scheduled Meds:  bacitracin   Topical BID   Chlorhexidine Gluconate Cloth  6 each Topical Daily   enoxaparin  70 mg Subcutaneous Q12H   feeding supplement (PROSource TF)  45 mL Per Tube BID   ferrous sulfate  300 mg Per Tube Q breakfast   folic acid  1 mg Intravenous Daily   hydrALAZINE  25 mg Per Tube Q8H   mouth rinse  15 mL Mouth Rinse q12n4p   metoCLOPramide (REGLAN) injection  5 mg Intravenous Q6H   pantoprazole (PROTONIX) IV  40 mg Intravenous Q12H   prenatal multivitamin  1 tablet Per Tube Q1200   scopolamine  1 patch Transdermal Q72H   sodium chloride flush  10-40 mL Intracatheter Q12H   sodium chloride flush  3 mL Intravenous Q12H   Continuous Infusions:  sodium chloride Stopped (12/19/20 1700)   feeding supplement (OSMOLITE 1.5 CAL) 1,000 mL (12/22/20 1551)   insulin 1.1 Units/hr (12/23/20 0803)   lactated ringers 10 mL/hr at 12/18/20 1811   promethazine (PHENERGAN)25mg  in 12mL NS 25 mg (12/22/20 2126)   thiamine injection 500 mg (12/22/20 1432)   PRN Meds: sodium chloride, acetaminophen **OR** acetaminophen, dextrose, guaiFENesin-dextromethorphan, loperamide, ondansetron, phenol, prochlorperazine, promethazine (PHENERGAN)25mg  in 8mL NS, sodium chloride flush, sodium chloride flush   Vital Signs    Vitals:   12/23/20 0500 12/23/20 0521 12/23/20 0522 12/23/20 0749  BP:  131/66 131/66 134/60  Pulse:   100   Resp:   18 18  Temp:   97.9 F (36.6 C) 97.7 F (36.5 C)  TempSrc:   Oral Oral  SpO2: 95%  95% 97%  Weight:      Height:        Intake/Output Summary (Last 24 hours) at 12/23/2020 0855 Last data filed at 12/23/2020 0600 Gross per 24 hour  Intake 1491.69 ml  Output 1080 ml  Net 411.69 ml   Last 3 Weights 12/21/2020 12/20/2020 12/19/2020  Weight (lbs)  153 lb 0.6 oz 149 lb 150 lb 12 oz  Weight (kg) 69.418 kg 67.586 kg 68.38 kg      Telemetry    SR/ST - Personally Reviewed  ECG    N/a - Personally Reviewed  Physical Exam   VS:  BP 134/60 (BP Location: Right Leg)   Pulse 100   Temp 97.7 F (36.5 C) (Oral)   Resp 18   Ht 5\' 1"  (1.549 m)   Wt 69.4 kg   LMP  (LMP Unknown)   SpO2 97%   BMI 28.92 kg/m  , BMI Body mass index is 28.92 kg/m. GENERAL:  Flat affect.   HEENT: Pupils equal round and reactive, fundi not visualized, oral mucosa unremarkable NECK:  No JVD.  Waveform within normal limits, carotid upstroke brisk and symmetric, no bruits LUNGS: CTAB. HEART:  RRR.  PMI not displaced or sustained,S1 and S2 within normal limits, no S3, no S4, no clicks, no rubs, no murmurs ABD:  Flat, positive bowel sounds normal in frequency in pitch, no bruits, no rebound, no guarding, no midline pulsatile mass, no hepatomegaly, no splenomegaly EXT:  2 plus pulses throughout, 2+ LE edema, no cyanosis no clubbing SKIN:  No rashes no nodules NEURO:  Cranial nerves II through XII grossly intact, motor grossly intact throughout PSYCH:  Cognitively  intact, oriented to person place and time   Labs    High Sensitivity Troponin:  No results for input(s): TROPONINIHS in the last 720 hours.    Chemistry Recent Labs  Lab 12/20/20 0430 12/21/20 0550 12/22/20 0625 12/23/20 0527  NA 139 140 139 139  K 3.9 4.2 3.8 4.4  CL 103 103 103 103  CO2 29 29 29 27   GLUCOSE 122* 112* 108* 98  BUN 24* 22* 25* 27*  CREATININE 1.16* 1.14* 1.16* 1.22*  CALCIUM 7.6* 7.3* 7.3* 7.7*  PROT 5.2* 5.2* 4.8*  --   ALBUMIN 2.8* 2.6* 2.5*  --   AST 23 27 26   --   ALT 11 17 17   --   ALKPHOS 71 60 56  --   BILITOT 0.7 0.7 0.7  --   GFRNONAA >60 >60 >60 >60  ANIONGAP 7 8 7 9      Hematology Recent Labs  Lab 12/20/20 0430 12/21/20 0550 12/23/20 0527  WBC 5.9 5.7 6.9  RBC 2.47* 2.88* 2.91*  HGB 7.1* 8.5* 8.4*  HCT 22.2* 25.9* 26.2*  MCV 89.9 89.9 90.0   MCH 28.7 29.5 28.9  MCHC 32.0 32.8 32.1  RDW 15.2 15.1 15.3  PLT 222 232 261    BNP Recent Labs  Lab 12/17/20 0049  BNP 2,128.9*     DDimer No results for input(s): DDIMER in the last 168 hours.   Radiology    [place] MFM OB FOLLOW UP  Result Date: 12/22/2020 ----------------------------------------------------------------------  OBSTETRICS REPORT                       (Signed Final 12/22/2020 06:39 pm) ---------------------------------------------------------------------- Patient Info  ID #:       [id number]                          D.O.B.:  11/08/1990 (29 yrs)  Name:       KIALA RIDDLES [facility]                 Visit Date: 12/22/2020 08:54 am ---------------------------------------------------------------------- Performed By  Attending:        Ma Rings MD         Ref. Address:     [facility]                                                             [facility]                                                             [address]                                                             [address]  [address]                                                             [place]                                                             [address]  Performed By:     Sandi Mealy        [facility]  Referred By:      Linzie Collin MD ---------------------------------------------------------------------- Orders  #  Description                           Code        Ordered By  1  [place] MFM OB FOLLOW UP                   75170.01    Mariel Aloe ----------------------------------------------------------------------  #  Order #                     Accession #                Episode #  1  [id number]                   [id number]                 [id number] ---------------------------------------------------------------------- Indications   Pre-existing diabetes, type 1, in pregnancy,   O24.012  second trimester  Anemia during pregnancy in second trimester    O99.012  [redacted] weeks gestation of pregnancy                Z3A.26  Encounter for other antenatal screening        Z36.2  follow-up ---------------------------------------------------------------------- Fetal Evaluation  Num Of Fetuses:         1  Fetal Heart Rate(bpm):  132  Cardiac Activity:       Observed  Presentation:           Breech  Placenta:               Posterior  P. Cord Insertion:      Visualized  Amniotic Fluid  AFI FV:      Polyhydramnios                              Largest Pocket(cm)  8.5 ---------------------------------------------------------------------- Biometry  BPD:      66.4  mm     G. Age:  26w 5d         67  %    CI:        77.48   %    70 - 86                                                          FL/HC:      18.4   %    18.6 - 20.4  HC:      238.8  mm     G. Age:  26w 0d         24  %    HC/AC:      1.01        1.04 - 1.22  AC:      235.9  mm     G. Age:  27w 6d         91  %    FL/BPD:     66.3   %    71 - 87  FL:         44  mm     G. Age:  24w 3d          5  %    FL/AC:      18.7   %    20 - 24  HUM:      43.1  mm     G. Age:  25w 5d         40  %  Est. FW:     940  gm      2 lb 1 oz     59  % ---------------------------------------------------------------------- Gestational Age  U/S Today:     26w 2d                                        EDD:   03/28/21  Best:          Altamese Cabal 0d     Det. By:  Marcella Dubs         EDD:   03/30/21                                      (08/27/20) ---------------------------------------------------------------------- Anatomy  Cranium:               Appears normal         Aortic Arch:            Previously seen  Cavum:                 Previously seen        Ductal Arch:            Previously seen  Ventricles:            Appears normal         Diaphragm:              Appears normal  Choroid Plexus:         Previously seen  Stomach:                Appears normal, left                                                                        sided  Cerebellum:            Previously seen        Abdomen:                Previously seen  Posterior Fossa:       Previously seen        Abdominal Wall:         Previously seen  Nuchal Fold:           Previously seen        Cord Vessels:           Previously seen  Lips:                  Previously seen        Kidneys:                Previously seen  Thoracic:              Appears normal         Bladder:                Appears normal  Heart:                 Previously seen        Spine:                  Previously seen  RVOT:                  Previously seen        Upper Extremities:      Previously seen  LVOT:                  Previously seen        Lower Extremities:      Previously seen ---------------------------------------------------------------------- Cervix Uterus Adnexa  Cervix  Length:           4.74  cm.  Normal appearance by transabdominal scan. ---------------------------------------------------------------------- Comments  This patient has been hospitalized due to complications  related to diabetes.  The fetal growth appears appropriate for her gestational age.  Mild polyhydramnios with a maximal vertical pocket of greater  than 8 cm was noted today.  Should have another growth ultrasound performed in 4  weeks. ----------------------------------------------------------------------                   Ma RingsVictor Fang, MD Electronically Signed Final Report   12/22/2020 06:39 pm ----------------------------------------------------------------------   Cardiac Studies   Echo 12/17/20: 1. Left ventricular ejection fraction, by estimation, is 40 to 45%. The  left ventricle has mildly decreased function. The left ventricle  demonstrates global hypokinesis. Left ventricular diastolic parameters are  indeterminate.   2. Right ventricular systolic function is normal. The  right ventricular  size is mildly enlarged. There is normal pulmonary artery systolic  pressure. The estimated right ventricular systolic pressure is 29.2 mmHg.   3. Right atrial  size was mildly dilated.   4. A small pericardial effusion is present.   5. The mitral valve is normal in structure. Mild to moderate mitral valve  regurgitation.   6. The aortic valve is tricuspid. Aortic valve regurgitation is not  visualized. No aortic stenosis is present.   7. The inferior vena cava is normal in size with greater than 50%  respiratory variability, suggesting right atrial pressure of 3 mmHg.   Patient Profile     30 y.o. female [redacted] weeks pregnant with type 1 diabetes, anemia, and acute systolic and diastolic heart failure.  Previously treated at [facility] for sepsis and PNA.  Assessment & Plan    #Acute systolic and also heart failure: # AKI: LVEF 40 to 45%.  Etiology unclear.  Occurred too early for peripartum cardiomyopathy.  Given her hyperemesis gravidarum it is thought that nutritional deficiency is most likely diagnosis.  No ischemic symptoms.  She is gettign IV lasix but has a positive fluid balance again.  She has LE edema but no SOB or crackles one exam.  It is difficult to assess her intravascular volume as her LE edema is multifactorial from heart failure, hypoalbuminemia, pregnancy and bedrest.  Will add a BNP and get a CXR.  Renal function is worsening.  Hold IV lasix until the above data return.  # Hypertension:  BP stable on hydralazine.  # UE DVT: On Lovenox.  For questions or updates, please contact [facility] Please consult [email or web address] for contact info under        Signed, Chilton Si, MD  12/23/2020, 8:55 AM

## 2020-12-23 NOTE — Progress Notes (Signed)
PROGRESS NOTE    Ashley Camacho  VHQ:[id number] DOB: 09-Dec-1990 DOA: 12/16/2020 PCP: Hildred Laser, MD  [id number]   Assessment & Plan:   Active Problems:   Anemia affecting pregnancy in second trimester   AKI (acute kidney injury) (HCC)   Hyperemesis   Pregnancy with 24 completed weeks gestation   DKA, type 1, not at goal [facility])   Starvation ketoacidosis   Acute deep vein thrombosis (DVT) of brachial vein of right upper extremity (HCC)   Acute respiratory failure with hypoxia (HCC)   Vitreous hemorrhage of right eye (HCC)   Gastroparesis due to DM (HCC)   Diabetic retinopathy (HCC)   Acute on chronic combined systolic and diastolic CHF (congestive heart failure) (HCC)   Type 1 diabetes mellitus affecting pregnancy in second trimester, antepartum   HFrEF (heart failure with reduced ejection fraction) (HCC)   Patient is a 30 year old female G2,P1 at [redacted] weeks gestation who has a history of type 1 diabetes mellitus.  She presented to the emergency room for evaluation of persistent nausea and vomiting .  She was recently with multiple hospitalization, at [facility] for sepsis secondary to pneumonia and was there for 2 weeks.  Couple hospitalization at [facility] due to intractable nausea and vomiting. -Transferred from [facility] to [facility] 7/26 given her severe malnutrition, with hyperemesis gravidarum, for consideration of cortrak tube insertion.  Patient had core track constructed on admission, so far tolerating tube feed (she remains with significant nausea and vomiting, but this is at baseline and did not worsen with initiation of tube feed), as well her work-up was significant for acute CHF, requiring diuresis which is managed by cardiology.    Severe protein calorie malnutrition/hyperemesis gravidarum with underlying baseline gastroparesis -Patient unable to have an oral intake for some time now, she is started on TPN, core track tube was inserted 7/27, she is started on  tube feed managed by nutritionist, so far tolerating 50 cc/h.  TPN has been discontinued 7/28. -She remains with significant nausea and vomiting, but this is at her baseline over her last few weeks, does not appear to be worsening after starting her tube feed. . - she is with known history of brittle diabetes, her A1c was 5.8 11/21/2020 with known history of diabetes with polyneuropathy and gastroparesis -Added Reglan with underlying history of gastroparesis. -steroid hyperemesis gravidarum protocol continuous as it did not ever much help -She did receive albumin initially given significantly low albumin at 1.5. -We will monitor closely for refeeding syndrome.  Phosphorus, magnesium, BMP daily. -Discontinued Remeron 7/21 given prolonged QTC.  More stable, it is 458 today.  Type 1 diabetes mellitus insulin-dependent, uncontrolled with hypoglycemia. -Given patient oral intake is unreliable, and her CBG level fluctuating significantly, she will be kept on insulin drip for now.  Hyperemesis gravidarum -See above discussion  Cardiomyopathy/acute systolic heart failure 2D echocardiogram shows an LVEF of 45% -With lower extremity edema, likely combination of both CHF exacerbation and third spacing from hypoalbuminemia -Received IV albumin initially. -Cardiology input greatly appreciated, difficult to assess volume status, given baseline edema in the setting of hypoalbuminemia and pregnancy, will check BNP and x-ray before determining further need of IV diuresis especially with worsening creatinine.  . -Blood pressure acceptable with hydralazine -Possibly nutritional deficits contributing to heart failure continue with IV thiamine and folic acid.  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   -on full dose Lovenox.     Urinary retention -Patient [facility], this has been discontinued on admission. -She remains with recurrent urinary retention, encouraged to ambulate.  Retention improved with ambulation.  Hypomagnesemia -Repleted  Normocytic anemia -In the setting of pregnancy, she was transfused 1 unit PRBC on 7/27.  Another 1 unit on 7/29.    Prolonged QTC -Moved after stopping Remeron and changing Zofran to as needed.   - keep potassium> 4, magnesium> 2 -Improving QTC 458 today.  Did order topical antibiotic for site of right foot small wound due to sunburn.    DVT prophylaxis: ON:[id number]:treatment dose lovenox Code Status: Full code  Family Communication: None at bedside, I have tried to contact Mr Sondra ComeCruz, her significant other, left voicemail. .  Level of care: Antepartum Dispo:   The patient is [facility]: [facility]  Remains inpatient appropriate because:Inpatient level of care appropriate due to severity of illness   Dis[facility]: [facility]              Anticipated d/c is to: Home              Patient currently is not medically stable to d/c.              Difficult to place patient No   Subjective :  She was able to urinate couple times on her own yesterday, diarrhea has improved, she still complaining of persistent nausea and vomiting, but at baseline.     Objective: Vitals:   12/23/20 0500 12/23/20 0521 12/23/20 0522 12/23/20 0749  BP:  131/66 131/66 134/60  Pulse:   100   Resp:   18 18  Temp:   97.9 F (36.6 C) 97.7 F (36.5 C)  TempSrc:   Oral Oral  SpO2: 95%  95% 97%  Weight:      Height:        Intake/Output Summary (Last 24 hours) at 12/23/2020 1050 Last data filed at 12/23/2020 0930 Gross per 24 hour  Intake 1389.9 ml  Output 1080 ml  Net 309.9 ml   Filed Weights   12/19/20 0436 12/20/20 0602 12/21/20 0613  Weight: 68.4 kg 67.6 kg 69.4 kg     Examination:   Awake Alert, Oriented X 3, frail, ill-appearing.    Symmetrical Chest wall movement, Good air movement bilaterally, no crackles RRR,No Gallops,Rubs or new Murmurs, No Parasternal Heave +ve B.Sounds,No Cyanosis, Clubbing or edema, No new Rash or bruise   +1 edema bilaterally, right foot dorsal wound improving      Data Reviewed: I have personally reviewed following labs and imaging studies  CBC: Recent Labs  Lab 12/17/20 0049 12/17/20 1600 12/18/20 0406 12/20/20 0430 12/21/20 0550 12/23/20 0527  WBC 6.1 5.7 5.1 5.9 5.7 6.9  NEUTROABS 4.3  --   --   --   --   --   HGB 7.5* 7.0* 8.1* 7.1* 8.5* 8.4*  HCT 22.3* 21.2* 24.9* 22.2* 25.9* 26.2*  MCV 89.9 90.2 88.3 89.9 89.9 90.0  PLT 177 198 186 222 232 261   Basic Metabolic Panel: Recent Labs  Lab 12/19/20 0313 12/20/20 0430 12/21/20 0550 12/22/20 0625 12/23/20 0527  NA 138 139 140 139 139  K 3.9 3.9 4.2 3.8 4.4  CL 104 103 103 103 103  CO2 29 29 29 29 27   GLUCOSE 129* 122* 112* 108* 98  BUN 20 24* 22* 25*  27*  CREATININE 1.12* 1.16* 1.14* 1.16* 1.22*  CALCIUM 7.9* 7.6* 7.3* 7.3* 7.7*  MG 2.0 2.0 2.0 1.9 2.2  PHOS 2.8 3.0 3.7 4.0 5.1*   GFR: Estimated Creatinine Clearance: 60.6 mL/min (A) (by C-G formula based on SCr of 1.22 mg/dL (H)). Liver Function Tests: Recent Labs  Lab 12/18/20 0406 12/19/20 0313 12/20/20 0430 12/21/20 0550 12/22/20 0625  AST 15 17 23 27 26   ALT 8 9 11 17 17   ALKPHOS 67 59 71 60 56  BILITOT 1.4* 1.0 0.7 0.7 0.7  PROT 5.0* 5.2* 5.2* 5.2* 4.8*  ALBUMIN 2.7* 2.7* 2.8* 2.6* 2.5*   No results for input(s): LIPASE, AMYLASE in the last 168 hours. No results for input(s): AMMONIA in the last 168 hours. Coagulation Profile: No results for input(s): INR, PROTIME in the last 168 hours. Cardiac Enzymes: Recent Labs  Lab 12/17/20 0049  CKTOTAL 54  55   BNP (last 3 results) No results for input(s): PROBNP in the last 8760 hours. HbA1C: No results for input(s): HGBA1C in the last 72 hours. CBG: Recent Labs  Lab 12/23/20 0222  12/23/20 0326 12/23/20 0533 12/23/20 0746 12/23/20 1007  GLUCAP 92 96 98 93 106*   Lipid Profile: Recent Labs    12/21/20 0550  CHOL 115  HDL 25*  LDLCALC 69  TRIG 02/22/21  CHOLHDL 4.6    Thyroid Function Tests: No results for input(s): TSH, T4TOTAL, FREET4, T3FREE, THYROIDAB in the last 72 hours.  Anemia Panel: No results for input(s): VITAMINB12, FOLATE, FERRITIN, TIBC, IRON, RETICCTPCT in the last 72 hours.  Sepsis Labs: No results for input(s): PROCALCITON, LATICACIDVEN in the last 168 hours.  Recent Results (from the past 240 hour(s))  MRSA Next Gen by PCR, Nasal     Status: None   Collection Time: 12/15/20  9:20 PM   Specimen: Nasal Mucosa; Nasal Swab  Result Value Ref Range Status   MRSA by PCR Next Gen NOT DETECTED NOT DETECTED Final    Comment: (NOTE) The GeneXpert MRSA Assay (FDA approved for NASAL specimens only), is one component of a comprehensive MRSA colonization surveillance program. It is not intended to diagnose MRSA infection nor to guide or monitor treatment for MRSA infections. Test performance is not FDA approved in patients less than 46 years old. Performed at [facility], [address] [place], [address] [place]   [facility] Urine Culture     Status: Abnormal   Collection Time: 12/20/20  6:29 AM   Specimen: OB Catheterized; Urine  Result Value Ref Range Status   Specimen Description OB CATHETERIZED  Final   Special Requests NONE  Final   Culture (A)  Final    <10,000 COLONIES/mL INSIGNIFICANT GROWTH NO GROUP B STREP (S.AGALACTIAE) ISOLATED Performed at [facility], [address] [place], [address] [place]    Report Status 12/21/2020 FINAL  Final  Wet prep, genital     Status: None   Collection Time: 12/20/20  5:41 PM   Specimen: Vaginal  Result Value Ref Range Status   Yeast Wet Prep HPF POC NONE SEEN NONE SEEN Final   Trich, Wet Prep NONE SEEN NONE SEEN Final   Clue Cells Wet Prep HPF POC NONE SEEN NONE SEEN Final   WBC,  Wet Prep HPF POC NONE SEEN NONE SEEN Final   Sperm NONE SEEN  Final    Comment: Performed at [facility], [address], [place], [address] [place]      Radiology Studies: [place]  MFM OB FOLLOW UP  Result Date: 12/22/2020 ----------------------------------------------------------------------  OBSTETRICS REPORT                       (Signed Final 12/22/2020 06:39 pm) ---------------------------------------------------------------------- Patient Info  ID #:       [id number]                          D.O.B.:  [birth year] (29 yrs)  Name:       Ashley Camacho [facility]                 Visit Date: 12/22/2020 08:54 am ---------------------------------------------------------------------- Performed By  Attending:        Ma Rings MD         Ref. Address:     [facility]                                                             [facility]                                                             [address]                                                             [address]                                                             [address]                                                             [place]                                                             [address]  Performed By:     Sandi Mealy        [facility]  Referred By:      Linzie Collin MD ---------------------------------------------------------------------- Orders  #  Description                           Code        Ordered By  1  [place] MFM OB FOLLOW UP                   49449.67    Mariel Aloe ----------------------------------------------------------------------  #  Order #                     Accession #                Episode #  1  [id number]                   [id number]                 [id number] ---------------------------------------------------------------------- Indications  Pre-existing diabetes, type 1, in  pregnancy,   O24.012  second trimester  Anemia during pregnancy in second trimester    O99.012  [redacted] weeks gestation of pregnancy                Z3A.26  Encounter for other antenatal screening        Z36.2  follow-up ---------------------------------------------------------------------- Fetal Evaluation  Num Of Fetuses:         1  Fetal Heart Rate(bpm):  132  Cardiac Activity:       Observed  Presentation:           Breech  Placenta:               Posterior  P. Cord Insertion:      Visualized  Amniotic Fluid  AFI FV:      Polyhydramnios                              Largest Pocket(cm)                              8.5 ---------------------------------------------------------------------- Biometry  BPD:      66.4  mm     G. Age:  26w 5d         67  %    CI:        77.48   %    70 - 86                                                          FL/HC:      18.4   %    18.6 - 20.4  HC:      238.8  mm     G. Age:  26w 0d         24  %    HC/AC:      1.01        1.04 - 1.22  AC:      235.9  mm     G. Age:  27w 6d  91  %    FL/BPD:     66.3   %    71 - 87  FL:         44  mm     G. Age:  24w 3d          5  %    FL/AC:      18.7   %    20 - 24  HUM:      43.1  mm     G. Age:  25w 5d         40  %  Est. FW:     940  gm      2 lb 1 oz     59  % ---------------------------------------------------------------------- Gestational Age  U/S Today:     26w 2d                                        EDD:   03/28/21  Best:          Altamese Cabal 0d     Det. ByMarcella Dubs         EDD:   03/30/21                                      (08/27/20) ---------------------------------------------------------------------- Anatomy  Cranium:               Appears normal         Aortic Arch:            Previously seen  Cavum:                 Previously seen        Ductal Arch:            Previously seen  Ventricles:            Appears normal         Diaphragm:              Appears normal  Choroid Plexus:        Previously seen        Stomach:                 Appears normal, left                                                                        sided  Cerebellum:            Previously seen        Abdomen:                Previously seen  Posterior Fossa:       Previously seen        Abdominal Wall:         Previously seen  Nuchal Fold:           Previously seen        Cord Vessels:           Previously seen  Lips:  Previously seen        Kidneys:                Previously seen  Thoracic:              Appears normal         Bladder:                Appears normal  Heart:                 Previously seen        Spine:                  Previously seen  RVOT:                  Previously seen        Upper Extremities:      Previously seen  LVOT:                  Previously seen        Lower Extremities:      Previously seen ---------------------------------------------------------------------- Cervix Uterus Adnexa  Cervix  Length:           4.74  cm.  Normal appearance by transabdominal scan. ---------------------------------------------------------------------- Comments  This patient has been hospitalized due to complications  related to diabetes.  The fetal growth appears appropriate for her gestational age.  Mild polyhydramnios with a maximal vertical pocket of greater  than 8 cm was noted today.  Should have another growth ultrasound performed in 4  weeks. ----------------------------------------------------------------------                   Ma Rings, MD Electronically Signed Final Report   12/22/2020 06:39 pm ----------------------------------------------------------------------    Scheduled Meds:  bacitracin   Topical BID   Chlorhexidine Gluconate Cloth  6 each Topical Daily   enoxaparin  70 mg Subcutaneous Q12H   feeding supplement (PROSource TF)  45 mL Per Tube BID   ferrous sulfate  300 mg Per Tube Q breakfast   folic acid  1 mg Intravenous Daily   hydrALAZINE  25 mg Per Tube Q8H   mouth rinse  15 mL Mouth Rinse q12n4p    metoCLOPramide (REGLAN) injection  5 mg Intravenous Q6H   pantoprazole (PROTONIX) IV  40 mg Intravenous Q12H   prenatal multivitamin  1 tablet Per Tube Q1200   scopolamine  1 patch Transdermal Q72H   sodium chloride flush  10-40 mL Intracatheter Q12H   sodium chloride flush  3 mL Intravenous Q12H   Continuous Infusions:  sodium chloride Stopped (12/19/20 1700)   feeding supplement (OSMOLITE 1.5 CAL) 1,000 mL (12/22/20 1551)   insulin 1.7 Units/hr (12/23/20 1007)   lactated ringers 10 mL/hr at 12/18/20 1811   promethazine (PHENERGAN)25mg  in 76mL NS 25 mg (12/22/20 2126)   thiamine injection 500 mg (12/22/20 1432)     LOS: 7 days     Huey Bienenstock, MD [facility] If 7PM-7AM, please contact night-coverage 12/23/2020, 10:50 AM

## 2020-12-23 NOTE — Progress Notes (Signed)
Nutrition Follow-up  DOCUMENTATION CODES:  Not applicable  INTERVENTION:   Osmolite 1.5 at  goal rate of 50 ml/hr. Continues with baseline emesis   45 ml Prosource TF BID.    Tube feeding regimen provides 1880 kcal (100% of needs for repletion), 97 grams of protein, and 914 ml of free H2O.   Additional IVF of 0.9% sodium chloride at 10 ml/hr  25(OH)D level 15 ng/ml on 7/16 - consider Vitamin D supplements  Additional supplements: PNV q day Thiamine 500  mg IV q day - CHF Iron 60 mg q day Folic acid 1 mg q day  Mg sulfate, KCl, 8/1  Last weight on 7/31, 2+ edema per cardiology, consider daily weights  NUTRITION DIAGNOSIS:  Inadequate oral intake related to nausea, vomiting as evidenced by NPO status. Ongoing  GOAL:  Patient will meet greater than or equal to 90% of their needs  MONITOR:   Diet advancement, Labs, Weight trends, TF tolerance, Skin, I & O's  REASON FOR ASSESSMENT:  Enteral support/ tubefeedings  ASSESSMENT:  30 y.o. female with medical history significant of [redacted]w[redacted]d on presentation, Estimated Date of Delivery: 03/30/21 by by 9 week ultrasound, Type 1 diabetic, Cardiomyopathy, chronic systolic. Multiple recent admissions for hyperemesis/ pneumonia ( [facility] )  7/27- cortrak placed- post-pyloric placement  Pt with more positive affect today  Lab Results  Component Value Date   HGBA1C 5.8 (H) 11/21/2020    Diet Order:   Diet Order     None      EDUCATION NEEDS:   No education needs have been identified at this time  Skin:  Skin Assessment: Reviewed RN Assessment  Last BM:  daily   Height:   Ht Readings from Last 1 Encounters:  12/17/20 5\' 1"  (1.549 m)    Weight:   Wt Readings from Last 1 Encounters:  12/21/20 69.4 kg    Ideal Body Weight:  47.7 kg  BMI:  Body mass index is 28.92 kg/m.  Estimated Nutritional Needs:   Kcal:  1700-1900  Protein:  90-105 grams  Fluid:  > 1.7 L  12/23/20 M.M LDN Neonatal  Nutrition Support Specialist/RD III

## 2020-12-23 NOTE — Progress Notes (Addendum)
Physical Therapy Treatment Patient Details Name: Ashley Camacho MRN: [id number] DOB: 12/10/1990 Today's Date: 12/23/2020    History of Present Illness 30 y.o. female admitted for hyperemesis gravidarum. Medical history significant of [redacted]w[redacted]d on presentation, Estimated Date of Delivery: 03/30/21 by by 9 week ultrasound, Type 1 diabetic, Cardiomyopathy, chronic systolic heart failure, DVT in bilateral arm. H/o recent hospitalizations d/t hyperemesis and sepsis secondary to pneumonia    PT Comments    Pt making steady progress. Fatigues quickly due to inactivity and frequent vomiting. Pt with 1 episode of vomiting during session. Expect continued progress with regaining some activity tolerance.    Follow Up Recommendations  No PT follow up     Equipment Recommendations  None recommended by PT    Recommendations for Other Services       Precautions / Restrictions Precautions Precautions: Other (comment) Precaution Comments: frequent vomiting    Mobility  Bed Mobility               General bed mobility comments: Pt up in bathroom on arrival    Transfers Overall transfer level: Modified independent Equipment used: None Transfers: Sit to/from Stand Sit to Stand: Modified independent (Device/Increase time)            Ambulation/Gait Ambulation/Gait assistance: Supervision;Min guard Gait Distance (Feet): 175 Feet (175' x 1, 60' x 1) Assistive device: None Gait Pattern/deviations: Step-through pattern;Decreased stride length Gait velocity: decr Gait velocity interpretation: 1.31 - 2.62 ft/sec, indicative of limited community ambulator General Gait Details: supervision for safety and lines for the first 125' then pt fatigued and min guard due to pt feeling weak. Pt took 5 minute seated rest between amb   Stairs             Wheelchair Mobility    Modified Rankin (Stroke Patients Only)       Balance Overall balance assessment: Mild deficits observed, not  formally tested                                          Cognition Arousal/Alertness: Awake/alert Behavior During Therapy: Flat affect Overall Cognitive Status: Within Functional Limits for tasks assessed                                        Exercises      General Comments General comments (skin integrity, edema, etc.): VSS on RA      Pertinent Vitals/Pain Pain Assessment: No/denies pain    Home Living                      Prior Function            PT Goals (current goals can now be found in the care plan section) Progress towards PT goals: Progressing toward goals    Frequency    Min 3X/week      PT Plan Current plan remains appropriate    Co-evaluation              AM-PAC PT "6 Clicks" Mobility   Outcome Measure  Help needed turning from your back to your side while in a flat bed without using bedrails?: None Help needed moving from lying on your back to sitting on the side of a flat bed without using bedrails?: None Help  needed moving to and from a bed to a chair (including a wheelchair)?: A Little Help needed standing up from a chair using your arms (e.g., wheelchair or bedside chair)?: None Help needed to walk in hospital room?: A Little Help needed climbing 3-5 steps with a railing? : A Little 6 Click Score: 21    End of Session   Activity Tolerance: Patient limited by fatigue Patient left: in chair;with call bell/phone within reach;with family/visitor present Nurse Communication: Mobility status PT Visit Diagnosis: Other abnormalities of gait and mobility (R26.89);Muscle weakness (generalized) (M62.81)     Time: 2423-5361 PT Time Calculation (min) (ACUTE ONLY): 19 min  Charges:  $Gait Training: 8-22 mins                     Joyce Eisenberg [facility] PT Acute Rehabilitation Services Pager [phone] Office [phone]    Angelina Ok [facility] 12/23/2020, 4:20 PM

## 2020-12-23 NOTE — Progress Notes (Signed)
Inpatient Diabetes Program Recommendations  AACE/ADA: New Consensus Statement on Inpatient Glycemic Control (2015)  Target Ranges:  Prepandial:   less than 140 mg/dL      Peak postprandial:   less than 180 mg/dL (1-2 hours)      Critically ill patients:  140 - 180 mg/dL   Lab Results  Component Value Date   GLUCAP 93 12/23/2020   HGBA1C 5.8 (H) 11/21/2020    Review of Glycemic Control Results for Ashley Camacho, Ashley Camacho (MRN [id number]) as of 12/23/2020 09:53  Ref. Range 12/23/2020 02:22 12/23/2020 03:26 12/23/2020 05:33 12/23/2020 07:46  Glucose-Capillary Latest Ref Range: 70 - 99 mg/dL 92 96 98 93  Diabetes history: Type 1 DM Current orders for Inpatient glycemic control: IV insulin Osmolite @ 50 ml/hr TPN- stopped Steroids- Solumedrol/Decadron taper- completed 7/31   Inpatient Diabetes Program Recommendations:    Continue with IV insulin.  Please do not transition off insulin drip without discussing with Diabetes Coordinator and pharmacy to make sure that insulin needs are covered adequately.  Continues to have vomiting.   Per Dr Grace Bushy, MFM: 7/29- "Agree with IV insulin support until control of HG is achieved. Once achieved consider appropriate insulin regimen per diabetic coordinator."  Yesterday placed [place]. Appears readings are consistent with fingerstick's. At this point, feel comfortable with CBGs using Freestyle and perform Q4H fingersticks.   Thanks, Lujean Rave, MSN, RNC-OB Diabetes Coordinator [phone] (8a-5p)

## 2020-12-24 LAB — TYPE AND SCREEN
ABO/RH(D): A POS
Antibody Screen: NEGATIVE
Unit division: 0

## 2020-12-24 LAB — BASIC METABOLIC PANEL
Anion gap: 10 (ref 5–15)
BUN: 26 mg/dL — ABNORMAL HIGH (ref 6–20)
CO2: 28 mmol/L (ref 22–32)
Calcium: 8.2 mg/dL — ABNORMAL LOW (ref 8.9–10.3)
Chloride: 102 mmol/L (ref 98–111)
Creatinine, Ser: 1.13 mg/dL — ABNORMAL HIGH (ref 0.44–1.00)
GFR, Estimated: >60 mL/min (ref >60)
Glucose, Bld: 69 mg/dL — ABNORMAL LOW (ref 70–99)
Potassium: 4.6 mmol/L (ref 3.5–5.1)
Sodium: 140 mmol/L (ref 135–145)

## 2020-12-24 LAB — CBC
HCT: 25.4 % — ABNORMAL LOW (ref 36.0–46.0)
Hemoglobin: 8 g/dL — ABNORMAL LOW (ref 12.0–15.0)
MCH: 28.9 pg (ref 26.0–34.0)
MCHC: 31.5 g/dL (ref 30.0–36.0)
MCV: 91.7 fL (ref 80.0–100.0)
Platelets: 239 10*3/uL (ref 150–400)
RBC: 2.77 MIL/uL — ABNORMAL LOW (ref 3.87–5.11)
RDW: 15.2 % (ref 11.5–15.5)
WBC: 8.8 10*3/uL (ref 4.0–10.5)
nRBC: 0 % (ref 0.0–0.2)

## 2020-12-24 LAB — MAGNESIUM: Magnesium: 2.4 mg/dL (ref 1.7–2.4)

## 2020-12-24 LAB — PHOSPHORUS: Phosphorus: 5.2 mg/dL — ABNORMAL HIGH (ref 2.5–4.6)

## 2020-12-24 LAB — BPAM RBC
Blood Product Expiration Date: 202208242359
ISSUE DATE / TIME: 202207271245
Unit Type and Rh: 6200

## 2020-12-24 MED ORDER — DOXYLAMINE SUCCINATE (SLEEP) 25 MG PO TABS: 12.5000 mg | ORAL_TABLET | Freq: Every day | ORAL | Status: DC

## 2020-12-24 MED ORDER — DOXYLAMINE SUCCINATE (SLEEP) 25 MG PO TABS
12.5000 mg | ORAL_TABLET | Freq: Every day | ORAL | Status: DC
  Administered 2020-12-24 – 2021-01-11 (×12): 12.5 mg via ORAL
  Filled 2020-12-24 (×14): qty 1

## 2020-12-24 MED ORDER — VITAMIN B-6 25 MG PO TABS
12.5000 mg | ORAL_TABLET | Freq: Every day | ORAL | Status: DC
  Administered 2020-12-24 – 2021-01-12 (×15): 12.5 mg via ORAL
  Filled 2020-12-24 (×16): qty 1

## 2020-12-24 MED ORDER — FUROSEMIDE 10 MG/ML IJ SOLN
20.0000 mg | Freq: Every day | INTRAMUSCULAR | Status: DC
  Administered 2020-12-24 – 2020-12-25 (×2): 20 mg via INTRAVENOUS
  Filled 2020-12-24 (×2): qty 2

## 2020-12-24 NOTE — Progress Notes (Signed)
Chaplain visited with Ashley Camacho at her bedside. Chaplain acknowledged how much she has been through during this hospitalization and offered support and education about spiritual care services. Chaplain also inquired about consult for advance directives. Pt affirmed that she wanted more information and was able to listen to education at this time. Chaplain provided education about advance directives-specifically living will and health care power of attorney and also explained the [place] plan for identifying a health care power of attorney when one has not been designated and pt is unable to make medical decisions on her own. Pt is not married and inquired about whether her significant other could be her health care power of attorney. Chaplain explained that any adult can serve in that role and did not need to be present to be designated, but encouraged pt to discuss this responsibility and the her wishes for care with her significant other. Chaplain provided the documents for patient to review this evening at her request. Pt believes she would like to complete them tomorrow and states she does not need assistance with writing. Chaplain informed pt RN, Crystal, that notary services are available M-Th from 1:00-3:30 and requested day shift RN notify spiritual care that pt would like to complete her advance directives. Also requested they contact chaplain in the morning if pt feels like more education would be helpful in advance of completing documentation.  Please page as further needs arise.  Maryanna Shape. Carley Hammed, M.Div. [facility] Chaplain Pager [phone] Office [phone]

## 2020-12-24 NOTE — Progress Notes (Signed)
PROGRESS NOTE    Ashley Camacho  OVF:[id number] DOB: [birth year] DOA: 12/16/2020 PCP: Hildred Laser, MD  [id number]   Assessment & Plan:   Active Problems:   Anemia affecting pregnancy in second trimester   AKI (acute kidney injury) (HCC)   Hyperemesis   Pregnancy with 24 completed weeks gestation   DKA, type 1, not at goal [facility])   Starvation ketoacidosis   Acute deep vein thrombosis (DVT) of brachial vein of right upper extremity (HCC)   Acute respiratory failure with hypoxia (HCC)   Vitreous hemorrhage of right eye (HCC)   Gastroparesis due to DM (HCC)   Diabetic retinopathy (HCC)   Acute on chronic combined systolic and diastolic CHF (congestive heart failure) (HCC)   Type 1 diabetes mellitus affecting pregnancy in second trimester, antepartum   HFrEF (heart failure with reduced ejection fraction) (HCC)   Patient is a 30 year old female G2,P1 at [redacted] weeks gestation who has a history of type 1 diabetes mellitus.  She presented to the emergency room for evaluation of persistent nausea and vomiting .  She was recently with multiple hospitalization, at [facility] for sepsis secondary to pneumonia and was there for 2 weeks.  Couple hospitalization at [facility] due to intractable nausea and vomiting. -Transferred from [facility] to [facility] 7/26 given her severe malnutrition, with hyperemesis gravidarum, for consideration of cortrak tube insertion.  Patient had core track constructed on admission, so far tolerating tube feed (she remains with significant nausea and vomiting, but this is at baseline and did not worsen with initiation of tube feed), as well her work-up was significant for acute CHF, requiring diuresis which is managed by cardiology.    Severe protein calorie malnutrition/hyperemesis gravidarum with underlying baseline gastroparesis -Patient unable to have an oral intake for some time now, she is started on TPN, core track tube was inserted 7/27, she is started on  tube feed managed by nutritionist, so far tolerating 50 cc/h.  TPN has been discontinued 7/28. -She remains with significant nausea and vomiting, but this is at her baseline over her last few weeks, does not appear to be worsening after starting her tube feed. . - she is with known history of brittle diabetes, her A1c was 5.8 11/21/2020 with known history of diabetes with polyneuropathy and gastroparesis -Added Reglan with underlying history of gastroparesis. -steroid hyperemesis gravidarum protocol continuous as it did not ever much help -She did receive albumin initially given significantly low albumin at 1.5. -Discontinued Remeron 7/21 given prolonged QTC.  More stable, it is 458 today. -Start vitamin B6/Unisom to see if it helps  Type 1 diabetes mellitus insulin-dependent, uncontrolled with hypoglycemia. -Given patient oral intake is unreliable, and her CBG level fluctuating significantly, she will be kept on insulin drip for now.  Hyperemesis gravidarum -See above discussion  Cardiomyopathy/acute systolic heart failure 2D echocardiogram shows an LVEF of 45% -With lower extremity edema, likely combination of both CHF exacerbation and third spacing from hypoalbuminemia -Received IV albumin initially. -This is per cardiology, she will be resumed back on a lower dose as his kidney function is improving, will continue to monitor renal function closely while on IV diuresis. -Blood pressure acceptable with hydralazine -Possibly nutritional deficits contributing to heart failure continue with IV thiamine and folic acid.  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   -on full  dose Lovenox.    Urinary retention -Patient presents with Foley catheter inserted at [facility], this has been discontinued on admission. -She  remains with recurrent urinary retention, encouraged to ambulate.   -Urinary retention improving with ambulation, no In-N-Out required yesterday.  Hypomagnesemia -Repleted  Normocytic anemia -In the setting of pregnancy, she was transfused 1 unit PRBC on 7/27.  Another 1 unit on 7/29.    Prolonged QTC -Moved after stopping Remeron and changing Zofran to as needed.   - keep potassium> 4, magnesium> 2 -Improving QTC 490 today.  Did order topical antibiotic for site of right foot small wound due to sunburn.    DVT prophylaxis: GG:YIRSWNIOE dose lovenox Code Status: Full code  Family Communication: None at bedside, I have tried to contact Mr Sondra Come, her significant other, left voicemail  8/2.  Level of care: Antepartum Dispo:   The patient is from: transfer from [facility]  Remains inpatient appropriate because:Inpatient level of care appropriate due to severity of illness   Dispo: The patient is from: [facility]              Anticipated d/c is to: Home              Patient currently is not medically stable to d/c.              Difficult to place patient No   Subjective :  No urinary retention overnight, she still complains of nausea or and vomiting    Objective: Vitals:   12/24/20 0526 12/24/20 0729 12/24/20 1143 12/24/20 1340  BP:  (!) 150/87 135/67 (!) 141/65  Pulse:  (!) 104 (!) 101   Resp:  16 16   Temp:  97.8 F (36.6 C) 97.8 F (36.6 C)   TempSrc:  Oral Oral   SpO2:  95%    Weight: 70.3 kg     Height:        Intake/Output Summary (Last 24 hours) at 12/24/2020 1408 Last data filed at 12/24/2020 1351 Gross per 24 hour  Intake 1185.14 ml  Output 1775 ml  Net -589.86 ml   Filed Weights   12/20/20 0602 12/21/20 0613 12/24/20 0526  Weight: 67.6 kg 69.4 kg 70.3 kg     Examination:   Awake Alert, Oriented X 3, No new F.N deficits, Normal affect Symmetrical Chest wall movement, Good air movement bilaterally, CTAB RRR,No Gallops,Rubs or new Murmurs, No Parasternal  Heave +ve B.Sounds, Abd Soft, No tenderness, No rebound - guarding or rigidity. No Cyanosis, Clubbing, she is with significant +1 lower extremity edema    Data Reviewed: I have personally reviewed following labs and imaging studies  CBC: Recent Labs  Lab 12/18/20 0406 12/20/20 0430 12/21/20 0550 12/23/20 0527 12/24/20 0547  WBC 5.1 5.9 5.7 6.9 8.8  HGB 8.1* 7.1* 8.5* 8.4* 8.0*  HCT 24.9* 22.2* 25.9* 26.2* 25.4*  MCV 88.3 89.9 89.9 90.0 91.7  PLT 186 222 232 261 239   Basic Metabolic Panel: Recent Labs  Lab 12/20/20 0430 12/21/20 0550 12/22/20 0625 12/23/20 0527 12/24/20 0547  NA 139 140 139 139 140  K 3.9 4.2 3.8 4.4 4.6  CL 103 103 103 103 102  CO2 29 29 29 27 28   GLUCOSE 122* 112* 108* 98 69*  BUN 24* 22* 25* 27* 26*  CREATININE 1.16* 1.14* 1.16* 1.22* 1.13*  CALCIUM 7.6* 7.3* 7.3* 7.7* 8.2*  MG 2.0 2.0 1.9 2.2 2.4  PHOS 3.0 3.7 4.0 5.1* 5.2*   GFR: Estimated Creatinine Clearance: 65.9 mL/min (  A) (by C-G formula based on SCr of 1.13 mg/dL (H)). Liver Function Tests: Recent Labs  Lab 12/18/20 0406 12/19/20 0313 12/20/20 0430 12/21/20 0550 12/22/20 0625  AST 15 17 23 27 26   ALT 8 9 11 17 17   ALKPHOS 67 59 71 60 56  BILITOT 1.4* 1.0 0.7 0.7 0.7  PROT 5.0* 5.2* 5.2* 5.2* 4.8*  ALBUMIN 2.7* 2.7* 2.8* 2.6* 2.5*   No results for input(s): LIPASE, AMYLASE in the last 168 hours. No results for input(s): AMMONIA in the last 168 hours. Coagulation Profile: No results for input(s): INR, PROTIME in the last 168 hours. Cardiac Enzymes: No results for input(s): CKTOTAL, CKMB, CKMBINDEX, TROPONINI in the last 168 hours.  BNP (last 3 results) No results for input(s): PROBNP in the last 8760 hours. HbA1C: No results for input(s): HGBA1C in the last 72 hours. CBG: Recent Labs  Lab 12/23/20 0746 12/23/20 1007 12/23/20 1203 12/23/20 1236 12/23/20 1304  GLUCAP 93 106* 74 65* 123*   Lipid Profile: No results for input(s): CHOL, HDL, LDLCALC, TRIG, CHOLHDL,  LDLDIRECT in the last 72 hours.   Thyroid Function Tests: No results for input(s): TSH, T4TOTAL, FREET4, T3FREE, THYROIDAB in the last 72 hours.  Anemia Panel: No results for input(s): VITAMINB12, FOLATE, FERRITIN, TIBC, IRON, RETICCTPCT in the last 72 hours.  Sepsis Labs: No results for input(s): PROCALCITON, LATICACIDVEN in the last 168 hours.  Recent Results (from the past 240 hour(s))  MRSA Next Gen by PCR, Nasal     Status: None   Collection Time: 12/15/20  9:20 PM   Specimen: Nasal Mucosa; Nasal Swab  Result Value Ref Range Status   MRSA by PCR Next Gen NOT DETECTED NOT DETECTED Final    Comment: (NOTE) The GeneXpert MRSA Assay (FDA approved for NASAL specimens only), is one component of a comprehensive MRSA colonization surveillance program. It is not intended to diagnose MRSA infection nor to guide or monitor treatment for MRSA infections. Test performance is not FDA approved in patients less than 27 years old. Performed at [facility], [address] [place], [address] [place]   [facility] Urine Culture     Status: Abnormal   Collection Time: 12/20/20  6:29 AM   Specimen: OB Catheterized; Urine  Result Value Ref Range Status   Specimen Description OB CATHETERIZED  Final   Special Requests NONE  Final   Culture (A)  Final    <10,000 COLONIES/mL INSIGNIFICANT GROWTH NO GROUP B STREP (S.AGALACTIAE) ISOLATED Performed at [facility], [address] [place], [address] [place]    Report Status 12/21/2020 FINAL  Final  Wet prep, genital     Status: None   Collection Time: 12/20/20  5:41 PM   Specimen: Vaginal  Result Value Ref Range Status   Yeast Wet Prep HPF POC NONE SEEN NONE SEEN Final   Trich, Wet Prep NONE SEEN NONE SEEN Final   Clue Cells Wet Prep HPF POC NONE SEEN NONE SEEN Final   WBC, Wet Prep HPF POC NONE SEEN NONE SEEN Final   Sperm NONE SEEN  Final    Comment: Performed at [facility], [address], [place], [address] [place]       Radiology Studies: DG CHEST PORT 1 VIEW  Result Date: 12/23/2020 CLINICAL DATA:  Edema.  Pregnancy. EXAM: PORTABLE CHEST 1 VIEW COMPARISON:  12/17/2020. FINDINGS: Interim placement of feeding tube, tip appears to be over the duodenum. Left PICC line in stable position. Heart size normal.  Bilateral pulmonary infiltrates/edema, slightly improved from prior exam. Moderate right pleural effusion again noted without interim change. Shielding is noted over the abdomen. IMPRESSION: 1. Interim placement of feeding tube, tip appears to be over the duodenum. Left PICC line in stable position. 2. Bilateral pulmonary infiltrates/edema slightly improved from prior exam. Moderate right pleural effusion again noted without interim change. Electronically Signed   By: Maisie Fus  Register   On: 12/23/2020 11:23     Scheduled Meds:  bacitracin   Topical BID   Chlorhexidine Gluconate Cloth  6 each Topical Daily   enoxaparin  70 mg Subcutaneous Q12H   feeding supplement (PROSource TF)  45 mL Per Tube BID   ferrous sulfate  300 mg Per Tube Q breakfast   folic acid  1 mg Intravenous Daily   furosemide  20 mg Intravenous Daily   hydrALAZINE  25 mg Per Tube Q8H   mouth rinse  15 mL Mouth Rinse q12n4p   metoCLOPramide (REGLAN) injection  5 mg Intravenous Q6H   pantoprazole (PROTONIX) IV  40 mg Intravenous Q12H   prenatal multivitamin  1 tablet Per Tube Q1200   scopolamine  1 patch Transdermal Q72H   sodium chloride flush  10-40 mL Intracatheter Q12H   sodium chloride flush  3 mL Intravenous Q12H   Continuous Infusions:  sodium chloride Stopped (12/19/20 1700)   feeding supplement (OSMOLITE 1.5 CAL) 1,000 mL (12/24/20 1115)   insulin 1.1 Units/hr (12/24/20 1235)   lactated ringers 10 mL/hr at 12/18/20 1811   promethazine (PHENERGAN)25mg  in 19mL NS 25 mg (12/24/20 1239)   thiamine injection 500 mg (12/24/20 1351)     LOS: 8 days     Huey Bienenstock, MD [facility] If 7PM-7AM, please contact  night-coverage 12/24/2020, 2:08 PM

## 2020-12-24 NOTE — Progress Notes (Signed)
Progress Note  Patient Name: Ashley Camacho Date of Encounter: 12/24/2020  [facility] Cardiologist: None   Subjective   Feeling nauseous.  Otherwise well.   Inpatient Medications    Scheduled Meds:  bacitracin   Topical BID   Chlorhexidine Gluconate Cloth  6 each Topical Daily   enoxaparin  70 mg Subcutaneous Q12H   feeding supplement (PROSource TF)  45 mL Per Tube BID   ferrous sulfate  300 mg Per Tube Q breakfast   folic acid  1 mg Intravenous Daily   hydrALAZINE  25 mg Per Tube Q8H   mouth rinse  15 mL Mouth Rinse q12n4p   metoCLOPramide (REGLAN) injection  5 mg Intravenous Q6H   pantoprazole (PROTONIX) IV  40 mg Intravenous Q12H   prenatal multivitamin  1 tablet Per Tube Q1200   scopolamine  1 patch Transdermal Q72H   sodium chloride flush  10-40 mL Intracatheter Q12H   sodium chloride flush  3 mL Intravenous Q12H   Continuous Infusions:  sodium chloride Stopped (12/19/20 1700)   feeding supplement (OSMOLITE 1.5 CAL) 1,000 mL (12/23/20 1340)   insulin 1 Units/hr (12/24/20 0730)   lactated ringers 10 mL/hr at 12/18/20 1811   promethazine (PHENERGAN)25mg  in 50mL NS 25 mg (12/22/20 2126)   thiamine injection 500 mg (12/23/20 1529)   PRN Meds: sodium chloride, acetaminophen **OR** acetaminophen, dextrose, guaiFENesin-dextromethorphan, loperamide, ondansetron, phenol, prochlorperazine, promethazine (PHENERGAN)25mg  in 50mL NS, sodium chloride flush, sodium chloride flush   Vital Signs    Vitals:   12/23/20 2307 12/24/20 0321 12/24/20 0526 12/24/20 0729  BP: 136/65 139/70  (!) 150/87  Pulse: (!) 104 (!) 107  (!) 104  Resp: 18 18  16   Temp: 98.3 F (36.8 C) 98 F (36.7 C)  97.8 F (36.6 C)  TempSrc: Oral Oral  Oral  SpO2: 97% 97%  95%  Weight:   70.3 kg   Height:        Intake/Output Summary (Last 24 hours) at 12/24/2020 0834 Last data filed at 12/24/2020 0321 Gross per 24 hour  Intake 69.02 ml  Output 1100 ml  Net -1030.98 ml   Last 3 Weights 12/24/2020  12/21/2020 12/20/2020  Weight (lbs) 155 lb 0.4 oz 153 lb 0.6 oz 149 lb  Weight (kg) 70.319 kg 69.418 kg 67.586 kg      Telemetry    SR/ST - Personally Reviewed  ECG    N/a - Personally Reviewed  Physical Exam   VS:  BP (!) 150/87 (BP Location: Right Leg)   Pulse (!) 104   Temp 97.8 F (36.6 C) (Oral)   Resp 16   Ht 5\' 1"  (1.549 m)   Wt 70.3 kg   LMP  (LMP Unknown)   SpO2 95%   BMI 29.29 kg/m  , BMI Body mass index is 29.29 kg/m. GENERAL:  Flat affect.   HEENT: Pupils equal round and reactive, fundi not visualized, oral mucosa unremarkable NECK:  No JVD.  Waveform within normal limits, carotid upstroke brisk and symmetric, no bruits LUNGS: CTAB. HEART:  RRR.  PMI not displaced or sustained,S1 and S2 within normal limits, no S3, no S4, no clicks, no rubs, no murmurs ABD:  Flat, positive bowel sounds normal in frequency in pitch, no bruits, no rebound, no guarding, no midline pulsatile mass, no hepatomegaly, no splenomegaly EXT:  2 plus pulses throughout, 2+ LE edema, no cyanosis no clubbing SKIN:  No rashes no nodules NEURO:  Cranial nerves II through XII grossly intact, motor grossly  intact throughout [facility]:  Cognitively intact, oriented to person place and time   Labs    High Sensitivity Troponin:  No results for input(s): TROPONINIHS in the last 720 hours.    Chemistry Recent Labs  Lab 12/20/20 0430 12/21/20 0550 12/22/20 0625 12/23/20 0527 12/24/20 0547  NA 139 140 139 139 140  K 3.9 4.2 3.8 4.4 4.6  CL 103 103 103 103 102  CO2 29 29 29 27 28   GLUCOSE 122* 112* 108* 98 69*  BUN 24* 22* 25* 27* 26*  CREATININE 1.16* 1.14* 1.16* 1.22* 1.13*  CALCIUM 7.6* 7.3* 7.3* 7.7* 8.2*  PROT 5.2* 5.2* 4.8*  --   --   ALBUMIN 2.8* 2.6* 2.5*  --   --   AST 23 27 26   --   --   ALT 11 17 17   --   --   ALKPHOS 71 60 56  --   --   BILITOT 0.7 0.7 0.7  --   --   GFRNONAA >60 >60 >60 >60 >60  ANIONGAP 7 8 7 9 10      Hematology Recent Labs  Lab 12/21/20 0550  12/23/20 0527 12/24/20 0547  WBC 5.7 6.9 8.8  RBC 2.88* 2.91* 2.77*  HGB 8.5* 8.4* 8.0*  HCT 25.9* 26.2* 25.4*  MCV 89.9 90.0 91.7  MCH 29.5 28.9 28.9  MCHC 32.8 32.1 31.5  RDW 15.1 15.3 15.2  PLT 232 261 239    BNP Recent Labs  Lab 12/23/20 0833  BNP 480.0*     DDimer No results for input(s): DDIMER in the last 168 hours.   Radiology    DG CHEST PORT 1 VIEW  Result Date: 12/23/2020 CLINICAL DATA:  Edema.  Pregnancy. EXAM: PORTABLE CHEST 1 VIEW COMPARISON:  12/17/2020. FINDINGS: Interim placement of feeding tube, tip appears to be over the duodenum. Left PICC line in stable position. Heart size normal. Bilateral pulmonary infiltrates/edema, slightly improved from prior exam. Moderate right pleural effusion again noted without interim change. Shielding is noted over the abdomen. IMPRESSION: 1. Interim placement of feeding tube, tip appears to be over the duodenum. Left PICC line in stable position. 2. Bilateral pulmonary infiltrates/edema slightly improved from prior exam. Moderate right pleural effusion again noted without interim change. Electronically Signed   By: 02/23/21  Register   On: 12/23/2020 11:23   02/22/2021 MFM OB FOLLOW UP  Result Date: 12/22/2020 ----------------------------------------------------------------------  OBSTETRICS REPORT                       (Signed Final 12/22/2020 06:39 pm) ---------------------------------------------------------------------- Patient Info  ID #:       02/22/2021                          D.O.B.:  [birth year] (29 yrs)  Name:       Ashley Camacho [facility]                 Visit Date: 12/22/2020 08:54 am ---------------------------------------------------------------------- Performed By  Attending:        [birth year] MD         Ref. Address:     [facility]                                                             [address]                                                             [address]                                                              [address]                                                             [place]                                                             [address]  Performed By:     Sandi Mealy        [facility]  Referred By:      Linzie Collin MD ---------------------------------------------------------------------- Orders  #  Description                           Code        Ordered By  1  [place] MFM OB FOLLOW UP                   07225.75    Mariel Aloe ----------------------------------------------------------------------  #  Order #                     Accession #                Episode #  1  [id number]                   [id number]                 [id number] ---------------------------------------------------------------------- Indications  Pre-existing diabetes, type 1, in pregnancy,   O24.012  second trimester  Anemia during pregnancy in second trimester    O99.012  [redacted] weeks gestation of pregnancy                Z3A.26  Encounter for other antenatal screening        Z36.2  follow-up ---------------------------------------------------------------------- Fetal Evaluation  Num Of Fetuses:         1  Fetal Heart Rate(bpm):  132  Cardiac Activity:       Observed  Presentation:           Breech  Placenta:               Posterior  P. Cord Insertion:      Visualized  Amniotic Fluid  AFI FV:      Polyhydramnios                              Largest Pocket(cm)                              8.5 ---------------------------------------------------------------------- Biometry  BPD:      66.4  mm     G. Age:  26w 5d         67  %    CI:        77.48   %    70 - 86                                                          FL/HC:      18.4   %    18.6 - 20.4  HC:      238.8  mm     G. Age:  26w 0d         24  %    HC/AC:      1.01        1.04 - 1.22  AC:      235.9   mm     G. Age:  27w 6d         91  %    FL/BPD:     66.3   %    71 - 87  FL:         44  mm     G. Age:  24w 3d          5  %    FL/AC:      18.7   %    20 - 24  HUM:      43.1  mm     G. Age:  25w 5d         40  %  Est. FW:     940  gm      2 lb 1 oz     59  % ---------------------------------------------------------------------- Gestational Age  U/S Today:     26w 2d                                        EDD:   03/28/21  Best:          Gwenevere Ghazi  Det. By:  Marcella DubsEarly Ultrasound         EDD:   03/30/21                                      (08/27/20) ---------------------------------------------------------------------- Anatomy  Cranium:               Appears normal         Aortic Arch:            Previously seen  Cavum:                 Previously seen        Ductal Arch:            Previously seen  Ventricles:            Appears normal         Diaphragm:              Appears normal  Choroid Plexus:        Previously seen        Stomach:                Appears normal, left                                                                        sided  Cerebellum:            Previously seen        Abdomen:                Previously seen  Posterior Fossa:       Previously seen        Abdominal Wall:         Previously seen  Nuchal Fold:           Previously seen        Cord Vessels:           Previously seen  Lips:                  Previously seen        Kidneys:                Previously seen  Thoracic:              Appears normal         Bladder:                Appears normal  Heart:                 Previously seen        Spine:                  Previously seen  RVOT:                  Previously seen        Upper Extremities:      Previously seen  LVOT:                  Previously seen        Lower Extremities:  Previously seen ---------------------------------------------------------------------- Cervix Uterus Adnexa  Cervix  Length:           4.74  cm.  Normal appearance by transabdominal scan.  ---------------------------------------------------------------------- Comments  This patient has been hospitalized due to complications  related to diabetes.  The fetal growth appears appropriate for her gestational age.  Mild polyhydramnios with a maximal vertical pocket of greater  than 8 cm was noted today.  Should have another growth ultrasound performed in 4  weeks. ----------------------------------------------------------------------                   Ma Rings, MD Electronically Signed Final Report   12/22/2020 06:39 pm ----------------------------------------------------------------------   Cardiac Studies   Echo 12/17/20: 1. Left ventricular ejection fraction, by estimation, is 40 to 45%. The  left ventricle has mildly decreased function. The left ventricle  demonstrates global hypokinesis. Left ventricular diastolic parameters are  indeterminate.   2. Right ventricular systolic function is normal. The right ventricular  size is mildly enlarged. There is normal pulmonary artery systolic  pressure. The estimated right ventricular systolic pressure is 29.2 mmHg.   3. Right atrial size was mildly dilated.   4. A small pericardial effusion is present.   5. The mitral valve is normal in structure. Mild to moderate mitral valve  regurgitation.   6. The aortic valve is tricuspid. Aortic valve regurgitation is not  visualized. No aortic stenosis is present.   7. The inferior vena cava is normal in size with greater than 50%  respiratory variability, suggesting right atrial pressure of 3 mmHg.   Patient Profile     30 y.o. female [redacted] weeks pregnant with type 1 diabetes, anemia, and acute systolic and diastolic heart failure.  Previously treated at [facility] for sepsis and PNA.  Assessment & Plan    #Acute systolic and also heart failure: # AKI: LVEF 40 to 45%.  Etiology unclear.  Occurred too early for peripartum cardiomyopathy.  Given her hyperemesis gravidarum it is thought that  nutritional deficiency is most likely diagnosis.  No ischemic symptoms.  Lasix was held yesterday due to worsening renal function and it is improving.  She was net -1L.  LE edema is worsening, +JVD, and CXR with pleural effusion and improving pulmonary edema.  BNP down to 480 from 2129.  Will resume lasix at 20mg  IV daily.  # Hypertension:  BP stable on hydralazine.  # UE DVT: On Lovenox.  For questions or updates, please contact [facility] Please consult [email or web address] for contact info under        Signed, , MD  12/24/2020, 8:34 AM

## 2020-12-24 NOTE — Progress Notes (Signed)
Patient ID: Ashley Camacho, female   DOB: Sep 22, 1990, 30 y.o.   MRN: [id number] ACULTY PRACTICE ANTEPARTUM COMPREHENSIVE PROGRESS NOTE  Ashley Camacho is a 30 y.o. G2P0101 at [redacted]w[redacted]d  who is admitted to IM service for complications related to DM, Type 1 in setting of second trimester   Fetal presentation is breech. Length of Stay:  8  Days  Subjective: No OB complaints Patient reports good fetal movement.  She reports no uterine contractions, no bleeding and no loss of fluid per vagina.  Vitals:  Blood pressure 135/67, pulse (!) 101, temperature 97.8 F (36.6 C), temperature source Oral, resp. rate 16, height 5\' 1"  (1.549 m), weight 70.3 kg, SpO2 95 %. Physical Examination: Lungs clear Heart RRR Abd soft gravid Ext 1-2 + edema Fetal Monitoring:   baseline 130-140's, no decles, no ut ctx  Labs:  Results for orders placed or performed during the hospital encounter of 12/16/20 (from the past 24 hour(s))  Phosphorus   Collection Time: 12/24/20  5:47 AM  Result Value Ref Range   Phosphorus 5.2 (H) 2.5 - 4.6 mg/dL  CBC   Collection Time: 12/24/20  5:47 AM  Result Value Ref Range   WBC 8.8 4.0 - 10.5 K/uL   RBC 2.77 (L) 3.87 - 5.11 MIL/uL   Hemoglobin 8.0 (L) 12.0 - 15.0 g/dL   HCT 02/23/21 (L) 73.2 - 20.2 %   MCV 91.7 80.0 - 100.0 fL   MCH 28.9 26.0 - 34.0 pg   MCHC 31.5 30.0 - 36.0 g/dL   RDW 54.2 70.6 - 23.7 %   Platelets 239 150 - 400 K/uL   nRBC 0.0 0.0 - 0.2 %  Basic metabolic panel   Collection Time: 12/24/20  5:47 AM  Result Value Ref Range   Sodium 140 135 - 145 mmol/L   Potassium 4.6 3.5 - 5.1 mmol/L   Chloride 102 98 - 111 mmol/L   CO2 28 22 - 32 mmol/L   Glucose, Bld 69 (L) 70 - 99 mg/dL   BUN 26 (H) 6 - 20 mg/dL   Creatinine, Ser 02/23/21 (H) 0.44 - 1.00 mg/dL   Calcium 8.2 (L) 8.9 - 10.3 mg/dL   GFR, Estimated 3.15 >17 mL/min   Anion gap 10 5 - 15  Magnesium   Collection Time: 12/24/20  5:47 AM  Result Value Ref Range   Magnesium 2.4 1.7 - 2.4 mg/dL  Type and  screen [facility]   Collection Time: 12/24/20  5:47 AM  Result Value Ref Range   ABO/RH(D) A POS    Antibody Screen NEG    Sample Expiration      12/27/2020,2359 Performed at [facility], [address] [place], [place]     Imaging Studies:    NA   Medications:  Scheduled  bacitracin   Topical BID   Chlorhexidine Gluconate Cloth  6 each Topical Daily   enoxaparin  70 mg Subcutaneous Q12H   feeding supplement (PROSource TF)  45 mL Per Tube BID   ferrous sulfate  300 mg Per Tube Q breakfast   folic acid  1 mg Intravenous Daily   furosemide  20 mg Intravenous Daily   hydrALAZINE  25 mg Per Tube Q8H   mouth rinse  15 mL Mouth Rinse q12n4p   metoCLOPramide (REGLAN) injection  5 mg Intravenous Q6H   pantoprazole (PROTONIX) IV  40 mg Intravenous Q12H   prenatal multivitamin  1 tablet Per Tube Q1200   scopolamine  1 patch Transdermal Q72H   sodium chloride flush  10-40 mL Intracatheter Q12H   sodium chloride flush  3 mL Intravenous Q12H   I have reviewed the patient's current medications.  ASSESSMENT & Plan  IUP 30 2/7 weeks Stable from an OB standpoint. Fetal well being reassuring. Management of medical issues per IM and Cardiology    Hermina Staggers 12/24/2020,1:11 PM

## 2020-12-24 NOTE — Progress Notes (Signed)
Occupational Therapy Treatment Patient Details Name: Ashley Camacho MRN: [id number] DOB: 1990/12/25 Today's Date: 12/24/2020    History of present illness 30 y.o. female admitted for hyperemesis gravidarum. Medical history significant of [redacted]w[redacted]d on presentation, Estimated Date of Delivery: 03/30/21 by by 9 week ultrasound, Type 1 diabetic, Cardiomyopathy, chronic systolic heart failure, DVT in bilateral arm. H/o recent hospitalizations d/t hyperemesis and sepsis secondary to pneumonia   OT comments  Pt instructed in use of AE for LB ADL, prefers to rely on staff or family when she returns home if still necessary. Educated in energy conservation strategies and provided written handout to reinforce.   Follow Up Recommendations  No OT follow up;Supervision/Assistance - 24 hour    Equipment Recommendations  None recommended by OT    Recommendations for Other Services      Precautions / Restrictions Precautions Precautions: Other (comment) Precaution Comments: frequent vomiting, cotrak       Mobility Bed Mobility Overal bed mobility: Needs Assistance Bed Mobility: Supine to Sit;Sit to Supine     Supine to sit: HOB elevated;Supervision Sit to supine: Supervision   General bed mobility comments: moves slowly,  + use of rail    Transfers   Equipment used: None Transfers: Sit to/from Stand Sit to Stand: Modified independent (Device/Increase time)         General transfer comment: Assist for safety and lines    Balance Overall balance assessment: Mild deficits observed, not formally tested                                         ADL either performed or assessed with clinical judgement   ADL Overall ADL's : Needs assistance/impaired     Grooming: Standing;Wash/dry hands;Supervision/safety         Lower Body Bathing Details (indicate cue type and reason): educated in use of long handled bath sponge and reacher       Lower Body Dressing Details  (indicate cue type and reason): educated in use of reacher and sock aide Toilet Transfer: Ambulation;Supervision/safety   Toileting- Clothing Manipulation and Hygiene: Sit to/from stand;Supervision/safety       Functional mobility during ADLs: Supervision/safety (slow and guarded) General ADL Comments: Educated in energy conservation strategies and provided written handout to reinforce.     Vision       Perception     Praxis      Cognition Arousal/Alertness: Awake/alert Behavior During Therapy: Flat affect Overall Cognitive Status: Within Functional Limits for tasks assessed                                 General Comments: limited verbalization        Exercises     Shoulder Instructions       General Comments      Pertinent Vitals/ Pain       Pain Assessment: No/denies pain  Home Living                                          Prior Functioning/Environment              Frequency  Min 2X/week        Progress Toward Goals  OT Goals(current goals can now  be found in the care plan section)  Progress towards OT goals: Progressing toward goals  Acute Rehab OT Goals Patient Stated Goal: did not state OT Goal Formulation: With patient Time For Goal Achievement: 01/01/21 Potential to Achieve Goals: Good  Plan Discharge plan remains appropriate    Co-evaluation                 AM-PAC OT "6 Clicks" Daily Activity     Outcome Measure   Help from another person eating meals?: Total Help from another person taking care of personal grooming?: A Little Help from another person toileting, which includes using toliet, bedpan, or urinal?: A Little Help from another person bathing (including washing, rinsing, drying)?: A Lot Help from another person to put on and taking off regular upper body clothing?: A Little Help from another person to put on and taking off regular lower body clothing?: A Lot 6 Click Score: 14     End of Session    OT Visit Diagnosis: Unsteadiness on feet (R26.81);Muscle weakness (generalized) (M62.81);Pain;Feeding difficulties (R63.3)   Activity Tolerance Patient limited by fatigue   Patient Left in bed;with call bell/phone within reach   Nurse Communication          Time: 8937-3428 OT Time Calculation (min): 18 min  Charges: OT General Charges $OT Visit: 1 Visit OT Treatments $Self Care/Home Management : 8-22 mins  Martie Round, OTR/L Acute Rehabilitation Services Pager: [phone] Office: [phone]   [name] 12/24/2020, 2:03 PM

## 2020-12-25 LAB — BASIC METABOLIC PANEL
Anion gap: 7 (ref 5–15)
BUN: 27 mg/dL — ABNORMAL HIGH (ref 6–20)
CO2: 26 mmol/L (ref 22–32)
Calcium: 8.3 mg/dL — ABNORMAL LOW (ref 8.9–10.3)
Chloride: 106 mmol/L (ref 98–111)
Creatinine, Ser: 1.24 mg/dL — ABNORMAL HIGH (ref 0.44–1.00)
GFR, Estimated: >60 mL/min (ref >60)
Glucose, Bld: 84 mg/dL (ref 70–99)
Potassium: 4.6 mmol/L (ref 3.5–5.1)
Sodium: 139 mmol/L (ref 135–145)

## 2020-12-25 LAB — CBC
HCT: 25.4 % — ABNORMAL LOW (ref 36.0–46.0)
Hemoglobin: 8.1 g/dL — ABNORMAL LOW (ref 12.0–15.0)
MCH: 29.1 pg (ref 26.0–34.0)
MCHC: 31.9 g/dL (ref 30.0–36.0)
MCV: 91.4 fL (ref 80.0–100.0)
Platelets: 237 10*3/uL (ref 150–400)
RBC: 2.78 MIL/uL — ABNORMAL LOW (ref 3.87–5.11)
RDW: 15.4 % (ref 11.5–15.5)
WBC: 8 10*3/uL (ref 4.0–10.5)
nRBC: 0 % (ref 0.0–0.2)

## 2020-12-25 LAB — GLUCOSE, CAPILLARY: Glucose-Capillary: 97 mg/dL (ref 70–99)

## 2020-12-25 MED ORDER — ALBUMIN HUMAN 5 % IV SOLN
12.5000 g | Freq: Once | INTRAVENOUS | Status: DC
  Filled 2020-12-25: qty 250

## 2020-12-25 MED ORDER — ALTEPLASE 2 MG IJ SOLR
2.0000 mg | Freq: Once | INTRAMUSCULAR | Status: AC
  Administered 2020-12-26: 2 mg
  Filled 2020-12-25: qty 2

## 2020-12-25 MED ORDER — ALBUMIN HUMAN 25 % IV SOLN
12.5000 g | Freq: Four times a day (QID) | INTRAVENOUS | Status: AC
Start: 2020-12-25 — End: 2020-12-28
  Administered 2020-12-25 – 2020-12-28 (×12): 12.5 g via INTRAVENOUS
  Filled 2020-12-25 (×14): qty 50

## 2020-12-25 NOTE — Progress Notes (Signed)
Progress Note  Patient Name: Ashley Camacho Date of Encounter: 12/25/2020  [facility] Cardiologist: None   Subjective   Nausea is a little better today.  Denies shortness of breath.   Inpatient Medications    Scheduled Meds:  bacitracin   Topical BID   Chlorhexidine Gluconate Cloth  6 each Topical Daily   doxylamine (Sleep)  12.5 mg Oral QHS   enoxaparin  70 mg Subcutaneous Q12H   feeding supplement (PROSource TF)  45 mL Per Tube BID   ferrous sulfate  300 mg Per Tube Q breakfast   folic acid  1 mg Intravenous Daily   furosemide  20 mg Intravenous Daily   hydrALAZINE  25 mg Per Tube Q8H   mouth rinse  15 mL Mouth Rinse q12n4p   metoCLOPramide (REGLAN) injection  5 mg Intravenous Q6H   pantoprazole (PROTONIX) IV  40 mg Intravenous Q12H   prenatal multivitamin  1 tablet Per Tube Q1200   scopolamine  1 patch Transdermal Q72H   sodium chloride flush  10-40 mL Intracatheter Q12H   sodium chloride flush  3 mL Intravenous Q12H   vitamin B-6  12.5 mg Oral QHS   Continuous Infusions:  sodium chloride Stopped (12/19/20 1700)   feeding supplement (OSMOLITE 1.5 CAL) 1,000 mL (12/24/20 1115)   insulin 1.6 Units/hr (12/25/20 0803)   lactated ringers 10 mL/hr at 12/18/20 1811   promethazine (PHENERGAN)25mg  in 22mL NS 25 mg (12/25/20 0416)   thiamine injection 500 mg (12/24/20 1351)   PRN Meds: sodium chloride, acetaminophen **OR** acetaminophen, dextrose, guaiFENesin-dextromethorphan, loperamide, ondansetron, phenol, prochlorperazine, promethazine (PHENERGAN)25mg  in 44mL NS, sodium chloride flush, sodium chloride flush   Vital Signs    Vitals:   12/24/20 2356 12/25/20 0322 12/25/20 0500 12/25/20 0726  BP: 130/62 (!) 155/92  126/70  Pulse: (!) 103 100  (!) 101  Resp: 18 20  16   Temp: 98 F (36.7 C) 97.6 F (36.4 C)  97.9 F (36.6 C)  TempSrc: Axillary Axillary  Axillary  SpO2: 100% 98%  99%  Weight:   69.5 kg   Height:        Intake/Output Summary (Last 24 hours) at  12/25/2020 0918 Last data filed at 12/25/2020 0655 Gross per 24 hour  Intake 2078.37 ml  Output 2202 ml  Net -123.63 ml   Last 3 Weights 12/25/2020 12/24/2020 12/21/2020  Weight (lbs) 153 lb 3.5 oz 155 lb 0.4 oz 153 lb 0.6 oz  Weight (kg) 69.5 kg 70.319 kg 69.418 kg      Telemetry    SR/ST - Personally Reviewed  ECG    N/a - Personally Reviewed  Physical Exam   VS:  BP 126/70 (BP Location: Right Leg)   Pulse (!) 101   Temp 97.9 F (36.6 C) (Axillary)   Resp 16   Ht 5\' 1"  (1.549 m)   Wt 69.5 kg   LMP  (LMP Unknown)   SpO2 99%   BMI 28.95 kg/m  , BMI Body mass index is 28.95 kg/m. GENERAL:  Flat affect.   HEENT: Pupils equal round and reactive, fundi not visualized, oral mucosa unremarkable NECK:  No JVD.  Waveform within normal limits, carotid upstroke brisk and symmetric, no bruits LUNGS: Diminished at L base HEART:  RRR.  PMI not displaced or sustained,S1 and S2 within normal limits, no S3, no S4, no clicks, no rubs, no murmurs ABD:  Flat, positive bowel sounds normal in frequency in pitch, no bruits, no rebound, no guarding, no midline pulsatile  mass, no hepatomegaly, no splenomegaly EXT:  2 plus pulses throughout, 2+ LE edema, no cyanosis no clubbing SKIN:  No rashes no nodules NEURO:  Cranial nerves II through XII grossly intact, motor grossly intact throughout PSYCH:  Cognitively intact, oriented to person place and time   Labs    High Sensitivity Troponin:  No results for input(s): TROPONINIHS in the last 720 hours.    Chemistry Recent Labs  Lab 12/20/20 0430 12/21/20 0550 12/22/20 0625 12/23/20 0527 12/24/20 0547 12/25/20 0640  NA 139 140 139 139 140 139  K 3.9 4.2 3.8 4.4 4.6 4.6  CL 103 103 103 103 102 106  CO2 29 29 29 27 28 26   GLUCOSE 122* 112* 108* 98 69* 84  BUN 24* 22* 25* 27* 26* 27*  CREATININE 1.16* 1.14* 1.16* 1.22* 1.13* 1.24*  CALCIUM 7.6* 7.3* 7.3* 7.7* 8.2* 8.3*  PROT 5.2* 5.2* 4.8*  --   --   --   ALBUMIN 2.8* 2.6* 2.5*  --   --   --    AST 23 27 26   --   --   --   ALT 11 17 17   --   --   --   ALKPHOS 71 60 56  --   --   --   BILITOT 0.7 0.7 0.7  --   --   --   GFRNONAA >60 >60 >60 >60 >60 >60  ANIONGAP 7 8 7 9 10 7      Hematology Recent Labs  Lab 12/23/20 0527 12/24/20 0547 12/25/20 0640  WBC 6.9 8.8 8.0  RBC 2.91* 2.77* 2.78*  HGB 8.4* 8.0* 8.1*  HCT 26.2* 25.4* 25.4*  MCV 90.0 91.7 91.4  MCH 28.9 28.9 29.1  MCHC 32.1 31.5 31.9  RDW 15.3 15.2 15.4  PLT 261 239 237    BNP Recent Labs  Lab 12/23/20 0833  BNP 480.0*     DDimer No results for input(s): DDIMER in the last 168 hours.   Radiology    No results found.  Cardiac Studies   Echo 12/17/20: 1. Left ventricular ejection fraction, by estimation, is 40 to 45%. The  left ventricle has mildly decreased function. The left ventricle  demonstrates global hypokinesis. Left ventricular diastolic parameters are  indeterminate.   2. Right ventricular systolic function is normal. The right ventricular  size is mildly enlarged. There is normal pulmonary artery systolic  pressure. The estimated right ventricular systolic pressure is 29.2 mmHg.   3. Right atrial size was mildly dilated.   4. A small pericardial effusion is present.   5. The mitral valve is normal in structure. Mild to moderate mitral valve  regurgitation.   6. The aortic valve is tricuspid. Aortic valve regurgitation is not  visualized. No aortic stenosis is present.   7. The inferior vena cava is normal in size with greater than 50%  respiratory variability, suggesting right atrial pressure of 3 mmHg.   Patient Profile     30 y.o. female [redacted] weeks pregnant with type 1 diabetes, anemia, and acute systolic and diastolic heart failure.  Previously treated at [facility] for sepsis and PNA.  Assessment & Plan    #Acute systolic and also heart failure: # AKI: LVEF 40 to 45%.  Etiology unclear.  Occurred too early for peripartum cardiomyopathy.  Given her hyperemesis gravidarum it is  thought that nutritional deficiency is most likely diagnosis.  No ischemic symptoms.  Renal function is worsened with diuresis but she remains significantly volume overloaded.  She has pleural effusion and significant lower extremity edema.  Diuresis is limited by hypoalbuminemia and third spacing.  Discussed with Dr. Randol Kern today.  We will plan to give her IV albumin to see if this will help with diuresis.  Continue Lasix at 20 mg daily.  # Hypertension:  BP stable on hydralazine.  # UE DVT: On Lovenox.  For questions or updates, please contact [facility] Please consult [email or web address] for contact info under        Signed, Chilton Si, MD  12/25/2020, 9:18 AM

## 2020-12-25 NOTE — Progress Notes (Signed)
ANTICOAGULATION CONSULT NOTE - Follow Up Consult  Pharmacy Consult for enoxaparin Indication:  VTE treatment for right brachial DVT  No Known Allergies  Patient Measurements: Height: 5\' 1"  (154.9 cm) Weight: 69.5 kg (153 lb 3.5 oz) IBW/kg (Calculated) : 47.8 Heparin Dosing Weight: 70.2 kg  Vital Signs: Temp: 98 F (36.7 C) (08/04 1525) Temp Source: Axillary (08/04 1525) BP: 126/49 (08/04 1525) Pulse Rate: 101 (08/04 1525)  Labs: Recent Labs    12/23/20 0527 12/24/20 0547 12/25/20 0640  HGB 8.4* 8.0* 8.1*  HCT 26.2* 25.4* 25.4*  PLT 261 239 237  CREATININE 1.22* 1.13* 1.24*    Estimated Creatinine Clearance: 59.7 mL/min (A) (by C-G formula based on SCr of 1.24 mg/dL (H)).   Medications:  Scheduled:   bacitracin   Topical BID   Chlorhexidine Gluconate Cloth  6 each Topical Daily   doxylamine (Sleep)  12.5 mg Oral QHS   enoxaparin  70 mg Subcutaneous Q12H   feeding supplement (PROSource TF)  45 mL Per Tube BID   ferrous sulfate  300 mg Per Tube Q breakfast   folic acid  1 mg Intravenous Daily   furosemide  20 mg Intravenous Daily   hydrALAZINE  25 mg Per Tube Q8H   mouth rinse  15 mL Mouth Rinse q12n4p   metoCLOPramide (REGLAN) injection  5 mg Intravenous Q6H   pantoprazole (PROTONIX) IV  40 mg Intravenous Q12H   prenatal multivitamin  1 tablet Per Tube Q1200   scopolamine  1 patch Transdermal Q72H   sodium chloride flush  10-40 mL Intracatheter Q12H   sodium chloride flush  3 mL Intravenous Q12H   vitamin B-6  12.5 mg Oral QHS    Assessment: 29 YOF at [redacted] weeks gestation currently receiving full dose (1 mg/kg BID) enoxaparin for recent right brachial DVT. Last anti-Xa level = 0.76 (7/29) within target range of 0.6-1. Scr has fluctuated over the past few days from 1.16>1.22>1.13>1.24 mg/dL. Weight has maintained.   Goal of Therapy:  Anti-Xa level 0.6-1 units/ml 4hrs after LMWH dose given Monitor platelets by anticoagulation protocol: Yes   Plan:  Continue  Lovenox 70mg  SQ BID. Will plan to obtain another anti-Xa peak on 8/5 ~4-6 hours post dose since it has been a week since last level and fluctuating renal function.  09-23-1981 Nysir Fergusson 12/25/2020,3:57 PM

## 2020-12-25 NOTE — Progress Notes (Signed)
PROGRESS NOTE    Ashley Camacho  AVW:[id number] DOB: 1990-09-27 DOA: 12/16/2020 PCP: Hildred Laser, MD  [id number]/[id number]-[id number]   Assessment & Plan:   Active Problems:   Anemia affecting pregnancy in second trimester   AKI (acute kidney injury) (HCC)   Hyperemesis   Pregnancy with 24 completed weeks gestation   DKA, type 1, not at goal [facility])   Starvation ketoacidosis   Acute deep vein thrombosis (DVT) of brachial vein of right upper extremity (HCC)   Acute respiratory failure with hypoxia (HCC)   Vitreous hemorrhage of right eye (HCC)   Gastroparesis due to DM (HCC)   Diabetic retinopathy (HCC)   Acute on chronic combined systolic and diastolic CHF (congestive heart failure) (HCC)   Type 1 diabetes mellitus affecting pregnancy in second trimester, antepartum   HFrEF (heart failure with reduced ejection fraction) (HCC)   Patient is a 30 year old female G2,P1 at [redacted] weeks gestation who has a history of type 1 diabetes mellitus.  She presented to the emergency room for evaluation of persistent nausea and vomiting .  She was recently with multiple hospitalization, at [facility] for sepsis secondary to pneumonia and was there for 2 weeks.  Couple hospitalization at [facility] due to intractable nausea and vomiting. -Transferred from [facility] to [facility] 7/26 given her severe malnutrition, with hyperemesis gravidarum, for consideration of cortrak tube insertion.  Patient had core track constructed on admission, so far tolerating tube feed (she remains with significant nausea and vomiting, but this is at baseline and did not worsen with initiation of tube feed), as well her work-up was significant for acute CHF, requiring diuresis which is managed by cardiology.    Severe protein calorie malnutrition/hyperemesis gravidarum with underlying baseline gastroparesis -Patient unable to have an oral intake for some time now, she is started on TPN, core track tube was inserted 7/27, she is started on  tube feed managed by nutritionist, so far tolerating 50 cc/h.  TPN has been discontinued 7/28. -She remains with significant nausea and vomiting, but this is at her baseline over her last few weeks, does not appear to be worsening after starting her tube feed. . - she is with known history of brittle diabetes, her A1c was 5.8 11/21/2020 with known history of diabetes with polyneuropathy and gastroparesis -Added Reglan with underlying history of gastroparesis. -steroid hyperemesis gravidarum protocol continuous as it did not ever much help -She did receive albumin initially given significantly low albumin at 1.5. -Discontinued Remeron 7/21 given prolonged QTC.  -Started vitamin B6/Unisom to see if it helps  Type 1 diabetes mellitus insulin-dependent, uncontrolled with hypoglycemia. -Given patient oral intake is unreliable, and her CBG level fluctuating significantly, she will be kept on insulin drip for now.  Hyperemesis gravidarum -See above discussion  Cardiomyopathy/acute systolic heart failure 2D echocardiogram shows an LVEF of 45% -With lower extremity edema, likely combination of both CHF exacerbation and third spacing from hypoalbuminemia -Received IV albumin initially. -Blood pressure acceptable with hydralazine -Possibly nutritional deficits contributing to heart failure continue with IV thiamine and folic acid. -This is per cardiology, she is significantly volume overloaded at this point, difficult to tell between third spacing, lower extremity edema from pregnancy versus volume overload, she is on low-dose diuresis currently, is with significant hypoalbuminemia and third spacing, so she will be started on IV albumin to assist with her edema.  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   -on full dose  Lovenox.    Urinary retention -Patient presents with Foley catheter inserted at [facility], this has been discontinued on admission. -She remains with recurrent urinary retention, encouraged to ambulate.   -Urinary retention improving with ambulation, no In-N-Out required yesterday.  Hypomagnesemia -Repleted  Normocytic anemia -In the setting of pregnancy, she was transfused 1 unit PRBC on 7/27.  Another 1 unit on 7/29.    Prolonged QTC -Moved after stopping Remeron and changing Zofran to as needed.   - keep potassium> 4, magnesium> 2 -Improving QTC 490 today.  Did order topical antibiotic for site of right foot small wound due to sunburn.    DVT prophylaxis: YM:EBRAXENMM dose lovenox Code Status: Full code  Family Communication: None at bedside, I have tried to contact Mr Sondra Come, her significant other, left voicemail  8/2.  Level of care: Antepartum Dispo:   The patient is from: transfer from [facility]  Remains inpatient appropriate because:Inpatient level of care appropriate due to severity of illness   Dispo: The patient is from: [facility]              Anticipated d/c is to: Home              Patient currently is not medically stable to d/c.              Difficult to place patient No   Subjective :  Urinary tensions, she denies any dyspnea or chest pain, she still having some nausea and vomiting   Objective: Vitals:   12/25/20 0322 12/25/20 0500 12/25/20 0726 12/25/20 1105  BP: (!) 155/92  126/70 140/71  Pulse: 100  (!) 101 (!) 101  Resp: 20  16 16   Temp: 97.6 F (36.4 C)  97.9 F (36.6 C) 98 F (36.7 C)  TempSrc: Axillary  Axillary Axillary  SpO2: 98%  99% 96%  Weight:  69.5 kg    Height:        Intake/Output Summary (Last 24 hours) at 12/25/2020 1150 Last data filed at 12/25/2020 1100 Gross per 24 hour  Intake 1567.16 ml  Output 2202 ml  Net -634.84 ml   Filed Weights   12/21/20 0613 12/24/20 0526 12/25/20 0500  Weight: 69.4 kg 70.3 kg 69.5 kg     Examination:    Awake Alert, Oriented X 3, No new F.N deficits, Normal affect Symmetrical Chest wall movement, she does have diminished air entry at the bases. RRR,No Gallops,Rubs or new Murmurs, No Parasternal Heave +ve B.Sounds, Abd Soft, No tenderness, No rebound - guarding or rigidity. No Cyanosis, Clubbing , +2 edema, No new Rash or bruise       Data Reviewed: I have personally reviewed following labs and imaging studies  CBC: Recent Labs  Lab 12/20/20 0430 12/21/20 0550 12/23/20 0527 12/24/20 0547 12/25/20 0640  WBC 5.9 5.7 6.9 8.8 8.0  HGB 7.1* 8.5* 8.4* 8.0* 8.1*  HCT 22.2* 25.9* 26.2* 25.4* 25.4*  MCV 89.9 89.9 90.0 91.7 91.4  PLT 222 232 261 239 237   Basic Metabolic Panel: Recent Labs  Lab 12/20/20 0430 12/21/20 0550 12/22/20 0625 12/23/20 0527 12/24/20 0547 12/25/20 0640  NA 139 140 139 139 140 139  K 3.9 4.2 3.8 4.4 4.6 4.6  CL 103 103 103 103 102 106  CO2 29 29 29 27 28 26   GLUCOSE 122* 112* 108* 98 69* 84  BUN 24* 22* 25* 27* 26* 27*  CREATININE 1.16* 1.14* 1.16* 1.22* 1.13* 1.24*  CALCIUM 7.6* 7.3* 7.3* 7.7* 8.2* 8.3*  MG 2.0 2.0 1.9 2.2 2.4  --   PHOS 3.0 3.7 4.0 5.1* 5.2*  --    GFR: Estimated Creatinine Clearance: 59.7 mL/min (A) (by C-G formula based on SCr of 1.24 mg/dL (H)). Liver Function Tests: Recent Labs  Lab 12/19/20 0313 12/20/20 0430 12/21/20 0550 12/22/20 0625  AST 17 23 27 26   ALT 9 11 17 17   ALKPHOS 59 71 60 56  BILITOT 1.0 0.7 0.7 0.7  PROT 5.2* 5.2* 5.2* 4.8*  ALBUMIN 2.7* 2.8* 2.6* 2.5*   No results for input(s): LIPASE, AMYLASE in the last 168 hours. No results for input(s): AMMONIA in the last 168 hours. Coagulation Profile: No results for input(s): INR, PROTIME in the last 168 hours. Cardiac Enzymes: No results for input(s): CKTOTAL, CKMB, CKMBINDEX, TROPONINI in the last 168 hours.  BNP (last 3 results) No results for input(s): PROBNP in the last 8760 hours. HbA1C: No results for input(s): HGBA1C in the last 72  hours. CBG: Recent Labs  Lab 12/23/20 0746 12/23/20 1007 12/23/20 1203 12/23/20 1236 12/23/20 1304  GLUCAP 93 106* 74 65* 123*   Lipid Profile: No results for input(s): CHOL, HDL, LDLCALC, TRIG, CHOLHDL, LDLDIRECT in the last 72 hours.   Thyroid Function Tests: No results for input(s): TSH, T4TOTAL, FREET4, T3FREE, THYROIDAB in the last 72 hours.  Anemia Panel: No results for input(s): VITAMINB12, FOLATE, FERRITIN, TIBC, IRON, RETICCTPCT in the last 72 hours.  Sepsis Labs: No results for input(s): PROCALCITON, LATICACIDVEN in the last 168 hours.  Recent Results (from the past 240 hour(s))  MRSA Next Gen by PCR, Nasal     Status: None   Collection Time: 12/15/20  9:20 PM   Specimen: Nasal Mucosa; Nasal Swab  Result Value Ref Range Status   MRSA by PCR Next Gen NOT DETECTED NOT DETECTED Final    Comment: (NOTE) The GeneXpert MRSA Assay (FDA approved for NASAL specimens only), is one component of a comprehensive MRSA colonization surveillance program. It is not intended to diagnose MRSA infection nor to guide or monitor treatment for MRSA infections. Test performance is not FDA approved in patients less than 76 years old. Performed at [facility], [address] [place], [address] [place]   [facility] Urine Culture     Status: Abnormal   Collection Time: 12/20/20  6:29 AM   Specimen: OB Catheterized; Urine  Result Value Ref Range Status   Specimen Description OB CATHETERIZED  Final   Special Requests NONE  Final   Culture (A)  Final    <10,000 COLONIES/mL INSIGNIFICANT GROWTH NO GROUP B STREP (S.AGALACTIAE) ISOLATED Performed at [facility], [address], [place], [address] [place]    Report Status 12/21/2020 FINAL  Final  Wet prep, genital     Status: None   Collection Time: 12/20/20  5:41 PM   Specimen: Vaginal  Result Value Ref Range Status   Yeast Wet Prep HPF POC NONE SEEN NONE SEEN Final   Trich, Wet Prep NONE SEEN NONE SEEN Final   Clue Cells  Wet Prep HPF POC NONE SEEN NONE SEEN Final   WBC, Wet Prep HPF POC NONE SEEN NONE SEEN Final   Sperm NONE SEEN  Final    Comment: Performed at [facility], [address], [place], [address] [place]      Radiology Studies: No results found.   Scheduled Meds:  bacitracin  Topical BID   Chlorhexidine Gluconate Cloth  6 each Topical Daily   doxylamine (Sleep)  12.5 mg Oral QHS   enoxaparin  70 mg Subcutaneous Q12H   feeding supplement (PROSource TF)  45 mL Per Tube BID   ferrous sulfate  300 mg Per Tube Q breakfast   folic acid  1 mg Intravenous Daily   furosemide  20 mg Intravenous Daily   hydrALAZINE  25 mg Per Tube Q8H   mouth rinse  15 mL Mouth Rinse q12n4p   metoCLOPramide (REGLAN) injection  5 mg Intravenous Q6H   pantoprazole (PROTONIX) IV  40 mg Intravenous Q12H   prenatal multivitamin  1 tablet Per Tube Q1200   scopolamine  1 patch Transdermal Q72H   sodium chloride flush  10-40 mL Intracatheter Q12H   sodium chloride flush  3 mL Intravenous Q12H   vitamin B-6  12.5 mg Oral QHS   Continuous Infusions:  sodium chloride Stopped (12/19/20 1700)   albumin human     feeding supplement (OSMOLITE 1.5 CAL) 1,000 mL (12/24/20 1115)   insulin 2.2 Units/hr (12/25/20 1120)   lactated ringers 10 mL/hr at 12/18/20 1811   promethazine (PHENERGAN)25mg  in 72mL NS 25 mg (12/25/20 0416)   thiamine injection 500 mg (12/24/20 1351)     LOS: 9 days     Huey Bienenstock, MD [facility] If 7PM-7AM, please contact night-coverage 12/25/2020, 11:50 AM

## 2020-12-25 NOTE — Progress Notes (Signed)
VAST RN called unit and spoke with pt's nurse regarding PICC line dressing change needed. Unit RN is competent in changing PICC dressings and will take care of it today. Encouraged unit RN to reach out with any issues or questions via IVT consult.

## 2020-12-25 NOTE — Progress Notes (Signed)
Patient ID: Ashley Camacho, female   DOB: 12-06-1990, 30 y.o.   MRN: [id number] [facility]ICE ANTEPARTUM COMPREHENSIVE PROGRESS NOTE  Ashley Camacho is a 30 y.o. G2P0101 at [redacted]w[redacted]d  who is admitted for to IM service for management of complications of Type 1 DM in setting of second trimester IUP.   Fetal presentation is breech. U/S 12/22/2020 Length of Stay:  9  Days  Subjective: No OB complaints this morning. Still problems with N/V Patient reports good fetal movement.  She reports no uterine contractions, no bleeding and no loss of fluid per vagina.  Vitals:  Blood pressure 126/70, pulse (!) 101, temperature 97.9 F (36.6 C), temperature source Axillary, resp. rate 16, height 5\' 1"  (1.549 m), weight 69.5 kg, SpO2 99 %. Physical Examination: Lungs clear  Heart RRR Abd soft + BS gravid Ext 1-2 + edema  Fetal Monitoring:  Baseline: 140's bpm, Variability: Good {> 6 bpm), Accelerations: Reactive, and Decelerations: Absent  Labs:  Results for orders placed or performed during the hospital encounter of 12/16/20 (from the past 24 hour(s))  CBC   Collection Time: 12/25/20  6:40 AM  Result Value Ref Range   WBC 8.0 4.0 - 10.5 K/uL   RBC 2.78 (L) 3.87 - 5.11 MIL/uL   Hemoglobin 8.1 (L) 12.0 - 15.0 g/dL   HCT 02/24/21 (L) 18.5 - 63.1 %   MCV 91.4 80.0 - 100.0 fL   MCH 29.1 26.0 - 34.0 pg   MCHC 31.9 30.0 - 36.0 g/dL   RDW 49.7 02.6 - 37.8 %   Platelets 237 150 - 400 K/uL   nRBC 0.0 0.0 - 0.2 %  Basic metabolic panel   Collection Time: 12/25/20  6:40 AM  Result Value Ref Range   Sodium 139 135 - 145 mmol/L   Potassium 4.6 3.5 - 5.1 mmol/L   Chloride 106 98 - 111 mmol/L   CO2 26 22 - 32 mmol/L   Glucose, Bld 84 70 - 99 mg/dL   BUN 27 (H) 6 - 20 mg/dL   Creatinine, Ser 02/24/21 (H) 0.44 - 1.00 mg/dL   Calcium 8.3 (L) 8.9 - 10.3 mg/dL   GFR, Estimated 5.02 >77 mL/min   Anion gap 7 5 - 15    Imaging Studies:    NA   Medications:  Scheduled  bacitracin   Topical BID   Chlorhexidine  Gluconate Cloth  6 each Topical Daily   doxylamine (Sleep)  12.5 mg Oral QHS   enoxaparin  70 mg Subcutaneous Q12H   feeding supplement (PROSource TF)  45 mL Per Tube BID   ferrous sulfate  300 mg Per Tube Q breakfast   folic acid  1 mg Intravenous Daily   furosemide  20 mg Intravenous Daily   hydrALAZINE  25 mg Per Tube Q8H   mouth rinse  15 mL Mouth Rinse q12n4p   metoCLOPramide (REGLAN) injection  5 mg Intravenous Q6H   pantoprazole (PROTONIX) IV  40 mg Intravenous Q12H   prenatal multivitamin  1 tablet Per Tube Q1200   scopolamine  1 patch Transdermal Q72H   sodium chloride flush  10-40 mL Intracatheter Q12H   sodium chloride flush  3 mL Intravenous Q12H   vitamin B-6  12.5 mg Oral QHS   I have reviewed the patient's current medications.  ASSESSMENT & Plan  IUP 26 3/7  Multiple medical  complications related to Type 1 DM  Stable from a OB standpoint. No official MFM consult note placed from weekend. Will  re consult with specific question earliest gestation age for delivery at IM request due to difficulty managing her multiple medical problems in setting of pregnancy. Discussed with pt indications for MFM consult   Hermina Staggers 12/25/2020,10:27 AM

## 2020-12-26 ENCOUNTER — Inpatient Hospital Stay (HOSPITAL_COMMUNITY): Payer: Medicaid Other

## 2020-12-26 DIAGNOSIS — I10 Essential (primary) hypertension: Secondary | ICD-10-CM

## 2020-12-26 LAB — GLUCOSE, CAPILLARY
Glucose-Capillary: 100 mg/dL — ABNORMAL HIGH (ref 70–99)
Glucose-Capillary: 105 mg/dL — ABNORMAL HIGH (ref 70–99)
Glucose-Capillary: 114 mg/dL — ABNORMAL HIGH (ref 70–99)
Glucose-Capillary: 82 mg/dL (ref 70–99)
Glucose-Capillary: 86 mg/dL (ref 70–99)
Glucose-Capillary: 98 mg/dL (ref 70–99)

## 2020-12-26 LAB — BASIC METABOLIC PANEL
Anion gap: 6 (ref 5–15)
BUN: 27 mg/dL — ABNORMAL HIGH (ref 6–20)
CO2: 29 mmol/L (ref 22–32)
Calcium: 8.4 mg/dL — ABNORMAL LOW (ref 8.9–10.3)
Chloride: 106 mmol/L (ref 98–111)
Creatinine, Ser: 1.31 mg/dL — ABNORMAL HIGH (ref 0.44–1.00)
GFR, Estimated: 57 mL/min — ABNORMAL LOW (ref >60)
Glucose, Bld: 120 mg/dL — ABNORMAL HIGH (ref 70–99)
Potassium: 4.4 mmol/L (ref 3.5–5.1)
Sodium: 141 mmol/L (ref 135–145)

## 2020-12-26 LAB — CBC
HCT: 23.2 % — ABNORMAL LOW (ref 36.0–46.0)
Hemoglobin: 7.2 g/dL — ABNORMAL LOW (ref 12.0–15.0)
MCH: 28.7 pg (ref 26.0–34.0)
MCHC: 31 g/dL (ref 30.0–36.0)
MCV: 92.4 fL (ref 80.0–100.0)
Platelets: 235 10*3/uL (ref 150–400)
RBC: 2.51 MIL/uL — ABNORMAL LOW (ref 3.87–5.11)
RDW: 15.4 % (ref 11.5–15.5)
WBC: 7.6 10*3/uL (ref 4.0–10.5)
nRBC: 0 % (ref 0.0–0.2)

## 2020-12-26 LAB — PREPARE RBC (CROSSMATCH)

## 2020-12-26 LAB — HEPARIN ANTI-XA: Heparin LMW: 0.91 IU/mL

## 2020-12-26 MED ORDER — SODIUM CHLORIDE 0.9% IV SOLUTION: Freq: Once | INTRAVENOUS | Status: AC

## 2020-12-26 NOTE — Progress Notes (Signed)
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  She presented to the emergency room for evaluation of persistent nausea and vomiting .  She was recently with multiple hospitalization, at [facility] for sepsis secondary to pneumonia and was there for 2 weeks.  Couple hospitalization at [facility] due to intractable nausea and vomiting. -Transferred from [facility] to [facility] 7/26 given her severe malnutrition, with hyperemesis gravidarum, for consideration of cortrak tube insertion.  Patient had core track constructed on admission, so far tolerating tube feed (she remains with significant nausea and vomiting, but this is at baseline and did not worsen with initiation of tube feed), as well her work-up was significant for acute CHF, requiring diuresis which is managed by cardiology.    Severe protein calorie malnutrition/hyperemesis gravidarum with underlying baseline gastroparesis -Patient unable to have an oral intake for some time now, she is started on TPN, core track tube was inserted 7/27, she is started on  tube feed managed by nutritionist, so far tolerating 50 cc/h.  TPN has been discontinued 7/28. -She remains with significant nausea and vomiting, but this is at her baseline over her last few weeks, does not appear to be worsening after starting her tube feed. . - she is with known history of brittle diabetes, her A1c was 5.8 11/21/2020 with known history of diabetes with polyneuropathy and gastroparesis -Added Reglan with underlying history of gastroparesis. -steroid hyperemesis gravidarum protocol continuous as it did not ever much help -She did receive albumin initially given significantly low albumin at 1.5. -Discontinued Remeron 7/21 given prolonged QTC.  -Started vitamin B6/Unisom to see if it helps  Type 1 diabetes mellitus insulin-dependent, uncontrolled with hypoglycemia. -Given patient oral intake is unreliable, and her CBG level fluctuating significantly, she will be kept on insulin drip for now.  Hyperemesis gravidarum -See above discussion  Cardiomyopathy/acute systolic heart failure 2D echocardiogram shows an LVEF of 45% -With lower extremity edema, likely combination of both CHF exacerbation and third spacing from hypoalbuminemia -Received IV albumin initially. -Blood pressure acceptable with hydralazine -Possibly nutritional deficits contributing to heart failure continue with IV thiamine and folic acid. -Diuresis management by cardiology, . - she is significantly volume overloaded at this point, difficult to tell between third spacing, lower extremity edema from pregnancy versus volume overload, she is on low-dose diuresis currently, is with significant hypoalbuminemia and third spacing, so she is  started on IV albumin to assist with her edema.  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    -on full dose Lovenox.    pregnancy 26 weeks -Patient with multiple medical problems, anemia requiring multiple transfusions, worsening renal function, volume overload, difficult to diurese, still with significant nausea and vomiting due to hyperemesis gravidarum/gastroparesis, have discussed with OB for earliest gestation age of delivery given, they will get MFM involved.  Urinary retention -Patient presents with Foley catheter inserted at [facility], this has been discontinued on admission. -No recurrence, she was encouraged to keep ambulating  Hypomagnesemia -Repleted  Normocytic anemia -In the setting of pregnancy, she was transfused 1 unit PRBC on 7/27./29, will receive another unit today.   Prolonged QTC -Moved after stopping Remeron and changing Zofran to as needed.   - keep potassium> 4, magnesium> 2 -Improving QTC.  Patient complaining with some tenderness in the soft area in left lower abdomen wall, appears to be cyst on physical exam, will obtain repeat ultrasound for that area.  Did order topical antibiotic for site of right foot small wound due to sunburn.    DVT prophylaxis: VU:[id number] dose lovenox Code Status: Full code  Family Communication: None at bedside, I have tried to contact Mr Sondra Come, her significant other, left voicemail  8/2.  Level of care: Antepartum Dispo:   The patient is from: transfer from [facility]  Remains inpatient appropriate because:Inpatient level of care appropriate due to severity of illness   Dispo: The patient is from: [facility]              Anticipated d/c is to: Home              Patient currently is not medically stable to d/c.              Difficult to place patient No   Subjective :  Urinary tensions, she denies any dyspnea or chest pain, she still having some nausea and vomiting   Objective: Vitals:   12/26/20 0500 12/26/20 0545 12/26/20 0802 12/26/20 0839  BP: (!) 146/68  128/60 (!) 147/66  Pulse: (!) 106  (!) 102   Resp: 16  18  18   Temp:   97.6 F (36.4 C) 97.6 F (36.4 C)  TempSrc:   Axillary Axillary  SpO2:   95% 95%  Weight:  69.6 kg    Height:        Intake/Output Summary (Last 24 hours) at 12/26/2020 1008 Last data filed at 12/26/2020 0802 Gross per 24 hour  Intake 2009.79 ml  Output 1200 ml  Net 809.79 ml   Filed Weights   12/24/20 0526 12/25/20 0500 12/26/20 0545  Weight: 70.3 kg 69.5 kg 69.6 kg     Examination:   Awake Alert, Oriented X 3, frail, chronically ill-appearing.   Symmetrical Chest wall movement, mildly diminished at the bases RRR,No Gallops,Rubs or new Murmurs, No Parasternal Heave +ve B.Sounds, Abd Soft, No tenderness, No rebound - guarding or rigidity.  She had very small area, likely cyst, in the left lower abdomen. No Cyanosis, Clubbing,+2   edema, No new Rash or bruise        Data Reviewed: I have personally reviewed following labs and imaging studies  CBC: Recent Labs  Lab 12/21/20 0550 12/23/20 0527 12/24/20 0547 12/25/20 0640 12/26/20 0330  WBC 5.7 6.9 8.8 8.0 7.6  HGB 8.5* 8.4* 8.0* 8.1* 7.2*  HCT 25.9* 26.2* 25.4* 25.4* 23.2*  MCV 89.9 90.0 91.7 91.4 92.4  PLT 232 261 239 237 235  Basic Metabolic Panel: Recent Labs  Lab 12/20/20 0430 12/21/20 0550 12/22/20 0625 12/23/20 0527 12/24/20 0547 12/25/20 0640 12/26/20 0330  NA 139 140 139 139 140 139 141  K 3.9 4.2 3.8 4.4 4.6 4.6 4.4  CL 103 103 103 103 102 106 106  CO2 29 29 29 27 28 26 29   GLUCOSE 122* 112* 108* 98 69* 84 120*  BUN 24* 22* 25* 27* 26* 27* 27*  CREATININE 1.16* 1.14* 1.16* 1.22* 1.13* 1.24* 1.31*  CALCIUM 7.6* 7.3* 7.3* 7.7* 8.2* 8.3* 8.4*  MG 2.0 2.0 1.9 2.2 2.4  --   --   PHOS 3.0 3.7 4.0 5.1* 5.2*  --   --    GFR: Estimated Creatinine Clearance: 56.5 mL/min (A) (by C-G formula based on SCr of 1.31 mg/dL (H)). Liver Function Tests: Recent Labs  Lab 12/20/20 0430 12/21/20 0550 12/22/20 0625  AST 23 27 26   ALT 11 17 17   ALKPHOS 71 60 56  BILITOT 0.7 0.7 0.7  PROT 5.2*  5.2* 4.8*  ALBUMIN 2.8* 2.6* 2.5*   No results for input(s): LIPASE, AMYLASE in the last 168 hours. No results for input(s): AMMONIA in the last 168 hours. Coagulation Profile: No results for input(s): INR, PROTIME in the last 168 hours. Cardiac Enzymes: No results for input(s): CKTOTAL, CKMB, CKMBINDEX, TROPONINI in the last 168 hours.  BNP (last 3 results) No results for input(s): PROBNP in the last 8760 hours. HbA1C: No results for input(s): HGBA1C in the last 72 hours. CBG: Recent Labs  Lab 12/23/20 1236 12/23/20 1304 12/25/20 2123 12/26/20 0119 12/26/20 0634  GLUCAP 65* 123* 97 100* 82   Lipid Profile: No results for input(s): CHOL, HDL, LDLCALC, TRIG, CHOLHDL, LDLDIRECT in the last 72 hours.   Thyroid Function Tests: No results for input(s): TSH, T4TOTAL, FREET4, T3FREE, THYROIDAB in the last 72 hours.  Anemia Panel: No results for input(s): VITAMINB12, FOLATE, FERRITIN, TIBC, IRON, RETICCTPCT in the last 72 hours.  Sepsis Labs: No results for input(s): PROCALCITON, LATICACIDVEN in the last 168 hours.  Recent Results (from the past 240 hour(s))  OB Urine Culture     Status: Abnormal   Collection Time: 12/20/20  6:29 AM   Specimen: OB Catheterized; Urine  Result Value Ref Range Status   Specimen Description OB CATHETERIZED  Final   Special Requests NONE  Final   Culture (A)  Final    <10,000 COLONIES/mL INSIGNIFICANT GROWTH NO GROUP B STREP (S.AGALACTIAE) ISOLATED Performed at [facility], [address], [place], [facility] [address]    Report Status 12/21/2020 FINAL  Final  Wet prep, genital     Status: None   Collection Time: 12/20/20  5:41 PM   Specimen: Vaginal  Result Value Ref Range Status   Yeast Wet Prep HPF POC NONE SEEN NONE SEEN Final   Trich, Wet Prep NONE SEEN NONE SEEN Final   Clue Cells Wet Prep HPF POC NONE SEEN NONE SEEN Final   WBC, Wet Prep HPF POC NONE SEEN NONE SEEN Final   Sperm NONE SEEN  Final    Comment: Performed at [facility], [address] [place], [facility] [address]      Radiology Studies: No results found.   Scheduled Meds:  bacitracin   Topical BID   Chlorhexidine Gluconate Cloth  6 each Topical Daily   doxylamine (Sleep)  12.5 mg Oral QHS   enoxaparin  70 mg Subcutaneous Q12H   feeding supplement (PROSource TF)  45  mL Per Tube BID   ferrous sulfate  300 mg Per Tube Q breakfast   folic acid  1 mg Intravenous Daily   hydrALAZINE  25 mg Per Tube Q8H   mouth rinse  15 mL Mouth Rinse q12n4p   metoCLOPramide (REGLAN) injection  5 mg Intravenous Q6H   pantoprazole (PROTONIX) IV  40 mg Intravenous Q12H   prenatal multivitamin  1 tablet Per Tube Q1200   scopolamine  1 patch Transdermal Q72H   sodium chloride flush  10-40 mL Intracatheter Q12H   sodium chloride flush  3 mL Intravenous Q12H   vitamin B-6  12.5 mg Oral QHS   Continuous Infusions:  sodium chloride Stopped (12/19/20 1700)   albumin human 12.5 g (12/26/20 0407)   feeding supplement (OSMOLITE 1.5 CAL) 1,000 mL (12/26/20 0413)   insulin 1.5 Units/hr (12/26/20 0939)   lactated ringers 10 mL/hr at 12/25/20 1957   promethazine (PHENERGAN)25mg  in 57mL NS 25 mg (12/25/20 2317)   thiamine injection 500 mg (12/25/20 1336)     LOS: 10 days     Huey Bienenstock, MD [facility] If 7PM-7AM, please contact night-coverage 12/26/2020, 10:08 AM

## 2020-12-26 NOTE — Progress Notes (Signed)
Physical Therapy Treatment Patient Details Name: Ashley Camacho MRN: [id number] DOB: Jul 12, 1990 Today's Date: 12/26/2020    History of Present Illness 30 y.o. female admitted for hyperemesis gravidarum. Medical history significant of [redacted]w[redacted]d on presentation, Estimated Date of Delivery: 03/30/21 by by 9 week ultrasound, Type 1 diabetic, Cardiomyopathy, chronic systolic heart failure, DVT in bilateral arm. H/o recent hospitalizations d/t hyperemesis and sepsis secondary to pneumonia    PT Comments    Pt continues to fatigue quickly with activity and having continued nausea and vomiting. Encouraged pt to continue to mobilize as tolerated throughout the day.    Follow Up Recommendations  No PT follow up     Equipment Recommendations  None recommended by PT    Recommendations for Other Services       Precautions / Restrictions Precautions Precautions: Other (comment) Precaution Comments: frequent vomiting, cotrak    Mobility  Bed Mobility               General bed mobility comments: Pt up in recliner    Transfers Overall transfer level: Modified independent Equipment used: None Transfers: Sit to/from Stand Sit to Stand: Modified independent (Device/Increase time)            Ambulation/Gait Ambulation/Gait assistance: Supervision Gait Distance (Feet): 80 Feet Assistive device: None Gait Pattern/deviations: Step-through pattern;Decreased stride length;Wide base of support Gait velocity: decr Gait velocity interpretation: 1.31 - 2.62 ft/sec, indicative of limited community ambulator General Gait Details: Pt amb only in room due to nausea/vomiting, fatigue, and inability to tolerate mask with N/V.   Stairs             Wheelchair Mobility    Modified Rankin (Stroke Patients Only)       Balance Overall balance assessment: Mild deficits observed, not formally tested                                          Cognition  Arousal/Alertness: Awake/alert Behavior During Therapy: Flat affect Overall Cognitive Status: Within Functional Limits for tasks assessed                                        Exercises      General Comments        Pertinent Vitals/Pain Pain Assessment: No/denies pain    Home Living                      Prior Function            PT Goals (current goals can now be found in the care plan section) Progress towards PT goals: Progressing toward goals    Frequency    Min 2X/week      PT Plan Current plan remains appropriate;Frequency needs to be updated    Co-evaluation              AM-PAC PT "6 Clicks" Mobility   Outcome Measure  Help needed turning from your back to your side while in a flat bed without using bedrails?: None Help needed moving from lying on your back to sitting on the side of a flat bed without using bedrails?: None Help needed moving to and from a bed to a chair (including a wheelchair)?: A Little Help needed standing up from a chair using your  arms (e.g., wheelchair or bedside chair)?: None Help needed to walk in hospital room?: A Little Help needed climbing 3-5 steps with a railing? : A Little 6 Click Score: 21    End of Session   Activity Tolerance: Patient limited by fatigue;Other (comment) (nausea/vomiting) Patient left: in bed;with call bell/phone within reach;with family/visitor present Nurse Communication: Mobility status PT Visit Diagnosis: Other abnormalities of gait and mobility (R26.89);Muscle weakness (generalized) (M62.81)     Time: 7340-3709 PT Time Calculation (min) (ACUTE ONLY): 10 min  Charges:  $Gait Training: 8-22 mins                     [facility] PT Acute Rehabilitation Services Pager [phone] Office [phone]    Angelina Ok [facility] 12/26/2020, 2:09 PM

## 2020-12-26 NOTE — Progress Notes (Signed)
ANTICOAGULATION CONSULT NOTE - Follow Up Consult  Pharmacy Consult for Enoxaparin Indication:  VTE treatment for right brachial DVT  No Known Allergies  Patient Measurements: Height: 5\' 1"  (154.9 cm) Weight: 69.6 kg (153 lb 8 oz) IBW/kg (Calculated) : 47.8 Heparin Dosing Weight: 70.2  Vital Signs: Temp: 97.6 F (36.4 C) (08/05 0459) Temp Source: Oral (08/05 0459) BP: 146/68 (08/05 0500) Pulse Rate: 106 (08/05 0500)  Labs: Recent Labs    12/24/20 0547 12/25/20 0640 12/26/20 0330  HGB 8.0* 8.1* 7.2*  HCT 25.4* 25.4* 23.2*  PLT 239 237 235  HEPRLOWMOCWT  --   --  0.91  CREATININE 1.13* 1.24* 1.31*    Estimated Creatinine Clearance: 56.5 mL/min (A) (by C-G formula based on SCr of 1.31 mg/dL (H)).   Assessment: Anti-xa level 8/5 @ 0330 = 0.91. Last dose given 8/4 @ 2239 (5 hrs post dose).  Goal of Therapy:  Anti-Xa level 0.6-1 units/ml 4hrs after LMWH dose given Monitor platelets by anticoagulation protocol: Yes   Plan:  Continue Lovenox 70mg  SQ BID. Will need to continue to monitor renal function and if any worsening, would need to obtain repeat anti-xa levels.  10/4 12/26/2020,7:10 AM

## 2020-12-26 NOTE — Progress Notes (Signed)
Progress Note  Patient Name: Ashley Camacho Date of Encounter: 12/26/2020  [facility] Cardiologist: None   Subjective   Feeling nauseous.   Inpatient Medications    Scheduled Meds:  bacitracin   Topical BID   Chlorhexidine Gluconate Cloth  6 each Topical Daily   doxylamine (Sleep)  12.5 mg Oral QHS   enoxaparin  70 mg Subcutaneous Q12H   feeding supplement (PROSource TF)  45 mL Per Tube BID   ferrous sulfate  300 mg Per Tube Q breakfast   folic acid  1 mg Intravenous Daily   hydrALAZINE  25 mg Per Tube Q8H   mouth rinse  15 mL Mouth Rinse q12n4p   metoCLOPramide (REGLAN) injection  5 mg Intravenous Q6H   pantoprazole (PROTONIX) IV  40 mg Intravenous Q12H   prenatal multivitamin  1 tablet Per Tube Q1200   scopolamine  1 patch Transdermal Q72H   sodium chloride flush  10-40 mL Intracatheter Q12H   sodium chloride flush  3 mL Intravenous Q12H   vitamin B-6  12.5 mg Oral QHS   Continuous Infusions:  sodium chloride Stopped (12/19/20 1700)   albumin human 12.5 g (12/26/20 0407)   feeding supplement (OSMOLITE 1.5 CAL) 1,000 mL (12/26/20 0413)   insulin 2 Units/hr (12/26/20 0729)   lactated ringers 10 mL/hr at 12/25/20 1957   promethazine (PHENERGAN)25mg  in 54mL NS 25 mg (12/25/20 2317)   thiamine injection 500 mg (12/25/20 1336)   PRN Meds: sodium chloride, acetaminophen **OR** acetaminophen, dextrose, guaiFENesin-dextromethorphan, loperamide, ondansetron, phenol, prochlorperazine, promethazine (PHENERGAN)25mg  in 15mL NS, sodium chloride flush, sodium chloride flush   Vital Signs    Vitals:   12/26/20 0459 12/26/20 0500 12/26/20 0545 12/26/20 0802  BP:  (!) 146/68  128/60  Pulse:  (!) 106  (!) 102  Resp:  16  18  Temp: 97.6 F (36.4 C)   97.6 F (36.4 C)  TempSrc: Oral   Axillary  SpO2: 95%   95%  Weight:   69.6 kg   Height:        Intake/Output Summary (Last 24 hours) at 12/26/2020 0814 Last data filed at 12/26/2020 0802 Gross per 24 hour  Intake 2009.79 ml   Output 1400 ml  Net 609.79 ml   Last 3 Weights 12/26/2020 12/25/2020 12/24/2020  Weight (lbs) 153 lb 8 oz 153 lb 3.5 oz 155 lb 0.4 oz  Weight (kg) 69.627 kg 69.5 kg 70.319 kg      Telemetry    SR/ST - Personally Reviewed  ECG    N/a - Personally Reviewed  Physical Exam   VS:  BP 128/60   Pulse (!) 102   Temp 97.6 F (36.4 C) (Axillary)   Resp 18   Ht 5\' 1"  (1.549 m)   Wt 69.6 kg   LMP  (LMP Unknown)   SpO2 95%   BMI 29.00 kg/m  , BMI Body mass index is 29 kg/m. GENERAL:  Flat affect.   HEENT: Pupils equal round and reactive, fundi not visualized, oral mucosa unremarkable NECK:  No JVD.  Waveform within normal limits, carotid upstroke brisk and symmetric, no bruits LUNGS: Diminished at L base HEART:  RRR.  PMI not displaced or sustained,S1 and S2 within normal limits, no S3, no S4, no clicks, no rubs, no murmurs ABD:  Flat, positive bowel sounds normal in frequency in pitch, no bruits, no rebound, no guarding, no midline pulsatile mass, no hepatomegaly, no splenomegaly EXT:  2 plus pulses throughout, 2+ LE edema, no cyanosis  no clubbing SKIN:  No rashes no nodules NEURO:  Cranial nerves II through XII grossly intact, motor grossly intact throughout PSYCH:  Cognitively intact, oriented to person place and time   Labs    High Sensitivity Troponin:  No results for input(s): TROPONINIHS in the last 720 hours.    Chemistry Recent Labs  Lab 12/20/20 0430 12/21/20 0550 12/22/20 0625 12/23/20 0527 12/24/20 0547 12/25/20 0640 12/26/20 0330  NA 139 140 139   < > 140 139 141  K 3.9 4.2 3.8   < > 4.6 4.6 4.4  CL 103 103 103   < > 102 106 106  CO2 29 29 29    < > 28 26 29   GLUCOSE 122* 112* 108*   < > 69* 84 120*  BUN 24* 22* 25*   < > 26* 27* 27*  CREATININE 1.16* 1.14* 1.16*   < > 1.13* 1.24* 1.31*  CALCIUM 7.6* 7.3* 7.3*   < > 8.2* 8.3* 8.4*  PROT 5.2* 5.2* 4.8*  --   --   --   --   ALBUMIN 2.8* 2.6* 2.5*  --   --   --   --   AST 23 27 26   --   --   --   --   ALT 11  17 17   --   --   --   --   ALKPHOS 71 60 56  --   --   --   --   BILITOT 0.7 0.7 0.7  --   --   --   --   GFRNONAA >60 >60 >60   < > >60 >60 57*  ANIONGAP 7 8 7    < > 10 7 6    < > = values in this interval not displayed.     Hematology Recent Labs  Lab 12/24/20 0547 12/25/20 0640 12/26/20 0330  WBC 8.8 8.0 7.6  RBC 2.77* 2.78* 2.51*  HGB 8.0* 8.1* 7.2*  HCT 25.4* 25.4* 23.2*  MCV 91.7 91.4 92.4  MCH 28.9 29.1 28.7  MCHC 31.5 31.9 31.0  RDW 15.2 15.4 15.4  PLT 239 237 235    BNP Recent Labs  Lab 12/23/20 0833  BNP 480.0*     DDimer No results for input(s): DDIMER in the last 168 hours.   Radiology    No results found.  Cardiac Studies   Echo 12/17/20: 1. Left ventricular ejection fraction, by estimation, is 40 to 45%. The  left ventricle has mildly decreased function. The left ventricle  demonstrates global hypokinesis. Left ventricular diastolic parameters are  indeterminate.   2. Right ventricular systolic function is normal. The right ventricular  size is mildly enlarged. There is normal pulmonary artery systolic  pressure. The estimated right ventricular systolic pressure is 29.2 mmHg.   3. Right atrial size was mildly dilated.   4. A small pericardial effusion is present.   5. The mitral valve is normal in structure. Mild to moderate mitral valve  regurgitation.   6. The aortic valve is tricuspid. Aortic valve regurgitation is not  visualized. No aortic stenosis is present.   7. The inferior vena cava is normal in size with greater than 50%  respiratory variability, suggesting right atrial pressure of 3 mmHg.   Patient Profile     30 y.o. female [redacted] weeks pregnant with type 1 diabetes, anemia, and acute systolic and diastolic heart failure.  Previously treated at [facility] for sepsis and PNA.  Assessment & Plan    #  Acute systolic and also heart failure: # AKI: LVEF 40 to 45%.  Etiology unclear.  Occurred too early for peripartum cardiomyopathy.  Given her  hyperemesis gravidarum it is thought that nutritional deficiency is most likely diagnosis.  No ischemic symptoms.  She remains volume overloaded, mostly due to third spacing.  Renal function a little worse today.  She started getting IV albumin yesterday.  She will receive blood today due to hemoglobin 7.2.  We will hold Lasix and attempt diuresis tomorrow if renal function is stable.  # Hypertension:  BP stable on hydralazine.  # UE DVT: On Lovenox.  For questions or updates, please contact [facility] Please consult [email or web address] for contact info under        Signed, Chilton Si, MD  12/26/2020, 8:14 AM

## 2020-12-26 NOTE — Progress Notes (Signed)
Patient ID: Ashley Camacho, female   DOB: [birth year], 30 y.o.   MRN: [id number] ACULTY PRACTICE ANTEPARTUM COMPREHENSIVE PROGRESS NOTE  Ashley Camacho is a 30 y.o. G2P0101 at [redacted]w[redacted]d  who is admitted to IM service for managements of Type 1 DM complications in setting of second trimester pregnancy.   Fetal presentation is breech. U/S 12/22/20 Length of Stay:  10  Days  Subjective: No OB complaints this morning Patient reports good fetal movement.  She reports no uterine contractions, no bleeding and no loss of fluid per vagina.  Vitals:  Blood pressure (!) 147/66, pulse (!) 102, temperature 97.6 F (36.4 C), temperature source Axillary, resp. rate 18, height 5\' 1"  (1.549 m), weight 69.6 kg, SpO2 95 %. Physical Examination: Lungs clear Heart RRR Abd soft + BS gravid Ext 1-2 + edema  Fetal Monitoring:  Baseline: 130-140's bpm, Variability: Good {> 6 bpm), Accelerations: Reactive, and Decelerations: Absent  Labs:  Results for orders placed or performed during the hospital encounter of 12/16/20 (from the past 24 hour(s))  Glucose, capillary   Collection Time: 12/25/20  9:23 PM  Result Value Ref Range   Glucose-Capillary 97 70 - 99 mg/dL  Glucose, capillary   Collection Time: 12/26/20  1:19 AM  Result Value Ref Range   Glucose-Capillary 100 (H) 70 - 99 mg/dL  Low molecular wgt heparin (fractionated)   Collection Time: 12/26/20  3:30 AM  Result Value Ref Range   Heparin LMW 0.91 IU/mL  CBC   Collection Time: 12/26/20  3:30 AM  Result Value Ref Range   WBC 7.6 4.0 - 10.5 K/uL   RBC 2.51 (L) 3.87 - 5.11 MIL/uL   Hemoglobin 7.2 (L) 12.0 - 15.0 g/dL   HCT 02/25/21 (L) 54.6 - 27.0 %   MCV 92.4 80.0 - 100.0 fL   MCH 28.7 26.0 - 34.0 pg   MCHC 31.0 30.0 - 36.0 g/dL   RDW 35.0 09.3 - 81.8 %   Platelets 235 150 - 400 K/uL   nRBC 0.0 0.0 - 0.2 %  Basic metabolic panel   Collection Time: 12/26/20  3:30 AM  Result Value Ref Range   Sodium 141 135 - 145 mmol/L   Potassium 4.4 3.5 - 5.1 mmol/L    Chloride 106 98 - 111 mmol/L   CO2 29 22 - 32 mmol/L   Glucose, Bld 120 (H) 70 - 99 mg/dL   BUN 27 (H) 6 - 20 mg/dL   Creatinine, Ser 02/25/21 (H) 0.44 - 1.00 mg/dL   Calcium 8.4 (L) 8.9 - 10.3 mg/dL   GFR, Estimated 57 (L) >60 mL/min   Anion gap 6 5 - 15  Prepare RBC (crossmatch)   Collection Time: 12/26/20  6:21 AM  Result Value Ref Range   Order Confirmation      ORDER PROCESSED BY BLOOD BANK Performed at [facility], [address], [place], [place]   Glucose, capillary   Collection Time: 12/26/20  6:34 AM  Result Value Ref Range   Glucose-Capillary 82 70 - 99 mg/dL    Imaging Studies:    NA   Medications:  Scheduled  bacitracin   Topical BID   Chlorhexidine Gluconate Cloth  6 each Topical Daily   doxylamine (Sleep)  12.5 mg Oral QHS   enoxaparin  70 mg Subcutaneous Q12H   feeding supplement (PROSource TF)  45 mL Per Tube BID   ferrous sulfate  300 mg Per Tube Q breakfast   folic acid  1  mg Intravenous Daily   hydrALAZINE  25 mg Per Tube Q8H   mouth rinse  15 mL Mouth Rinse q12n4p   metoCLOPramide (REGLAN) injection  5 mg Intravenous Q6H   pantoprazole (PROTONIX) IV  40 mg Intravenous Q12H   prenatal multivitamin  1 tablet Per Tube Q1200   scopolamine  1 patch Transdermal Q72H   sodium chloride flush  10-40 mL Intracatheter Q12H   sodium chloride flush  3 mL Intravenous Q12H   vitamin B-6  12.5 mg Oral QHS   I have reviewed the patient's current medications.  ASSESSMENT & Plan  IUP 30 4/7 weeks Multiple medical complications related to Type 1 DM  Stable for OB stand point. MFM consult on chart. They will see again next week and make recommendations on timing of delivery. But do not expect prior to 28 weeks,goal would be 32-34 weeks if medically possible. Fetal well being reassuring at present. Appreciate IM and Cardiology care for pt.   Hermina Staggers 12/26/2028,10:48 AM

## 2020-12-27 ENCOUNTER — Inpatient Hospital Stay (HOSPITAL_COMMUNITY): Payer: Medicaid Other

## 2020-12-27 ENCOUNTER — Encounter (HOSPITAL_COMMUNITY): Payer: Self-pay | Admitting: Internal Medicine

## 2020-12-27 DIAGNOSIS — R0603 Acute respiratory distress: Secondary | ICD-10-CM

## 2020-12-27 LAB — CBC
HCT: 26.6 % — ABNORMAL LOW (ref 36.0–46.0)
Hemoglobin: 8.3 g/dL — ABNORMAL LOW (ref 12.0–15.0)
MCH: 29 pg (ref 26.0–34.0)
MCHC: 31.2 g/dL (ref 30.0–36.0)
MCV: 93 fL (ref 80.0–100.0)
Platelets: 211 10*3/uL (ref 150–400)
RBC: 2.86 MIL/uL — ABNORMAL LOW (ref 3.87–5.11)
RDW: 15.6 % — ABNORMAL HIGH (ref 11.5–15.5)
WBC: 8.6 10*3/uL (ref 4.0–10.5)
nRBC: 0 % (ref 0.0–0.2)

## 2020-12-27 LAB — TROPONIN I (HIGH SENSITIVITY)
Troponin I (High Sensitivity): 8 ng/L (ref <18)
Troponin I (High Sensitivity): 8 ng/L (ref <18)

## 2020-12-27 LAB — GLUCOSE, CAPILLARY
Glucose-Capillary: 104 mg/dL — ABNORMAL HIGH (ref 70–99)
Glucose-Capillary: 115 mg/dL — ABNORMAL HIGH (ref 70–99)
Glucose-Capillary: 100 mg/dL — ABNORMAL HIGH (ref 70–99)
Glucose-Capillary: 121 mg/dL — ABNORMAL HIGH (ref 70–99)
Glucose-Capillary: 91 mg/dL (ref 70–99)

## 2020-12-27 LAB — BASIC METABOLIC PANEL
Anion gap: 6 (ref 5–15)
BUN: 27 mg/dL — ABNORMAL HIGH (ref 6–20)
CO2: 27 mmol/L (ref 22–32)
Calcium: 8.6 mg/dL — ABNORMAL LOW (ref 8.9–10.3)
Chloride: 108 mmol/L (ref 98–111)
Creatinine, Ser: 1.36 mg/dL — ABNORMAL HIGH (ref 0.44–1.00)
GFR, Estimated: 54 mL/min — ABNORMAL LOW (ref >60)
Glucose, Bld: 91 mg/dL (ref 70–99)
Potassium: 4.4 mmol/L (ref 3.5–5.1)
Sodium: 141 mmol/L (ref 135–145)

## 2020-12-27 LAB — TYPE AND SCREEN
ABO/RH(D): A POS
Antibody Screen: NEGATIVE
Unit division: 0

## 2020-12-27 LAB — BPAM RBC
Blood Product Expiration Date: 202208292359
ISSUE DATE / TIME: 202208050810
Unit Type and Rh: 6200

## 2020-12-27 LAB — BRAIN NATRIURETIC PEPTIDE: B Natriuretic Peptide: 929.6 pg/mL — ABNORMAL HIGH (ref 0.0–100.0)

## 2020-12-27 MED ORDER — FUROSEMIDE 10 MG/ML IJ SOLN: 20.0000 mg | Freq: Once | INTRAMUSCULAR | Status: DC

## 2020-12-27 MED ORDER — FUROSEMIDE 10 MG/ML IJ SOLN
INTRAMUSCULAR | Status: AC
  Administered 2020-12-27: 20 mg via INTRAVENOUS
  Filled 2020-12-27: qty 2

## 2020-12-27 MED ORDER — FUROSEMIDE 10 MG/ML IJ SOLN: 40.0000 mg | Freq: Once | INTRAMUSCULAR | Status: DC

## 2020-12-27 MED ORDER — FUROSEMIDE 10 MG/ML IJ SOLN
20.0000 mg | Freq: Once | INTRAMUSCULAR | Status: AC
  Administered 2020-12-27: 20 mg via INTRAVENOUS

## 2020-12-27 MED ORDER — FUROSEMIDE 8 MG/ML PO SOLN: 20.0000 mg | Freq: Every day | ORAL | Status: DC

## 2020-12-27 MED ORDER — FERROUS SULFATE 300 (60 FE) MG/5ML PO SYRP
300.0000 mg | ORAL_SOLUTION | ORAL | Status: DC
  Administered 2020-12-28 – 2021-01-11 (×8): 300 mg
  Filled 2020-12-27 (×8): qty 5

## 2020-12-27 MED ORDER — FUROSEMIDE 10 MG/ML IJ SOLN
INTRAMUSCULAR | Status: AC
  Filled 2020-12-27: qty 2

## 2020-12-27 NOTE — Progress Notes (Addendum)
Patient ID: Ashley Camacho, female   DOB: [birth year], 30 y.o.   MRN: [id number]  PROGRESS NOTE    Ashley Camacho  TIW:[id number] DOB: 08/30/1990 DOA: 12/16/2020 PCP: Hildred Laser, MD    Brief Narrative:   Patient is a 30 year old female G2,P1 at [redacted] weeks gestation who has a history of type 1 diabetes mellitus.  She presented to the emergency room for evaluation of persistent nausea and vomiting .  She was recently with multiple hospitalization, at [facility] for sepsis secondary to pneumonia and was there for 2 weeks.  Couple hospitalization at [facility] due to intractable nausea and vomiting. -Transferred from [facility] to [facility] 7/26 given her severe malnutrition, with hyperemesis gravidarum, for consideration of cortrak tube insertion.  Patient had core track inserted on admission, so far tolerating tube feed (she remains with significant nausea and vomiting, but this is at baseline and did not worsen with initiation of tube feed), as well her work-up was significant for acute CHF, requiring diuresis which is managed by cardiology.     Assessment & Plan:   Active Problems:   Anemia affecting pregnancy in second trimester   AKI (acute kidney injury) (HCC)   Hyperemesis   Pregnancy with 24 completed weeks gestation   DKA, type 1, not at goal [employer])   Starvation ketoacidosis   Acute deep vein thrombosis (DVT) of brachial vein of right upper extremity (HCC)   Acute respiratory failure with hypoxia (HCC)   Vitreous hemorrhage of right eye (HCC)   Gastroparesis due to DM (HCC)   Diabetic retinopathy (HCC)   Acute on chronic combined systolic and diastolic CHF (congestive heart failure) (HCC)   Type 1 diabetes mellitus affecting pregnancy in second trimester, antepartum   HFrEF (heart failure with reduced ejection fraction) (HCC)  Severe protein calorie malnutrition/hyperemesis gravidarum with underlying baseline gastroparesis -Patient unable to have an oral intake for some  time now, she was previously started on TPN.  Core track tube was inserted 7/27, she is tolerating goal tube feeds at 50 cc/h.  TPN has been discontinued 7/28. -She remains with significant nausea and vomiting, and does not vomiting up her tube feeds. - she is with known history of brittle diabetes, her A1c was 5.8 11/21/2020 with known history of diabetes with polyneuropathy and gastroparesis -Added Reglan with underlying history of gastroparesis. -steroid hyperemesis gravidarum protocol discontinued as it did not help -She has been receiving albumin given significantly low albumin at 1.5.  Though given her nephropathy it is unclear if this is helping. -Discontinued Remeron 7/21 given prolonged QTC. -Continue Zofran, scopolamine patch, Protonix, Compazine, Phenergan.   Type 1 diabetes mellitus insulin-dependent, uncontrolled with hypoglycemia. -Given patient oral intake is unreliable, and her CBG level fluctuating significantly, she will be kept on insulin drip.   Hyperemesis gravidarum -See above discussion   Cardiomyopathy/acute systolic heart failure 2D echocardiogram shows an LVEF of 45% -With lower extremity edema, likely combination of both CHF exacerbation and third spacing from hypoalbuminemia -Receiving IV albumin -Blood pressure acceptable with hydralazine -Possibly nutritional deficits contributing to heart failure continue with IV thiamine and folic acid. -Diuresis management by cardiology, . - she is significantly volume overloaded at this point. -We have held her Lasix for the last 2 days due to worsening renal function and she seems acutely overloaded today with prominent JVD and diminished breath sounds at the bases.  Cardiology came to evaluate the patient acutely and we have given her IV Lasix today and will have to  resume Lasix going forward.-Stat portable chest x-ray, BNP, troponins, EKG which was not significantly changed. -Her underlying nephropathy is not helping at this  point and may lead to possible pericardial effusion.  If she bottoms out her blood pressure would need a stat echo.   History of right brachial DVT -She is on full dose anticoagulation Lovenox   Diabetic nephropathy Patient has underlying diabetic nephropathy with worsening serum creatinine since arrival up to 1.36 today her Lasix have been held for this however now she is fluid overloaded.  May need to consult nephrology should renal function worsen.  pregnancy 26 weeks -Patient with multiple medical problems, anemia requiring multiple transfusions, worsening renal function, volume overload, difficult to diurese, still with significant nausea and vomiting due to hyperemesis gravidarum/gastroparesis, have discussed with OB for earliest gestation age of delivery which they would hope for 32 to 34 weeks.   Urinary retention -Patient presents with Foley catheter inserted at [facility], this has been discontinued on admission. -Resolved   Hypomagnesemia -Repleted   Normocytic anemia -In the setting of pregnancy, she was transfused 1 unit PRBC on 7/27./29, will receive another unit today.     Prolonged QTC -Improved after stopping Remeron and changing Zofran to as needed.   - keep potassium> 4, magnesium> 2     DVT prophylaxis: On: Lovenox Code Status: Full code  Family Communication: Patient at bedside Disposition Plan: Patient remains inpatient due to tenuous fluid status, ongoing CHF, need for IV treatments, need for oxygen.   Consultants:  Cardiology OB Maternal-fetal medicine  Procedures: Echo Core track tube placement  Antimicrobials: Anti-infectives (From admission, onward)    None        Subjective: When patient was initially seen she reported ongoing nausea and vomiting which is not new for her she is tolerating her tube feeds have been up to the shower and seems to be doing well.  I was called back within 30 minutes with new onset chest pain that radiated into her  neck.  She was mildly hypoxic with oxygen saturations in the 90s and needed to sit up to breathe.  She had JVD noted.  Objective: Vitals:   12/27/20 1210 12/27/20 1215 12/27/20 1220 12/27/20 1225  BP:  (!) 142/60    Pulse:  (!) 104    Resp:      Temp:      TempSrc:      SpO2: 93% 91% 96% 97%  Weight:      Height:        Intake/Output Summary (Last 24 hours) at 12/27/2020 1259 Last data filed at 12/27/2020 1017 Gross per 24 hour  Intake 1964.39 ml  Output 850 ml  Net 1114.39 ml   Filed Weights   12/25/20 0500 12/26/20 0545 12/27/20 0710  Weight: 69.5 kg 69.6 kg 70.7 kg    Examination:  General exam: Appears pale and nervous Respiratory system: Markedly diminished breath sounds at the bases bilaterally Cardiovascular system: S1 & S2 heard, RRR.  Prominent JVD Gastrointestinal system: Abdomen is gravid, soft and nontender.  Central nervous system: Alert and oriented. No focal neurological deficits. Extremities: Symmetric with 3+ pedal edema up to at least the thighs Skin: There is an abrasion on her right foot Psychiatry: Judgement and insight appear normal. Mood & affect appropriate.     Data Reviewed: I have personally reviewed following labs and imaging studies  CBC: Recent Labs  Lab 12/23/20 0527 12/24/20 0547 12/25/20 0640 12/26/20 0330 12/27/20 0500  WBC 6.9 8.8 8.0  7.6 8.6  HGB 8.4* 8.0* 8.1* 7.2* 8.3*  HCT 26.2* 25.4* 25.4* 23.2* 26.6*  MCV 90.0 91.7 91.4 92.4 93.0  PLT 261 239 237 235 211   Basic Metabolic Panel: Recent Labs  Lab 12/21/20 0550 12/22/20 0625 12/23/20 0527 12/24/20 0547 12/25/20 0640 12/26/20 0330 12/27/20 0500  NA 140 139 139 140 139 141 141  K 4.2 3.8 4.4 4.6 4.6 4.4 4.4  CL 103 103 103 102 106 106 108  CO2 29 29 27 28 26 29 27   GLUCOSE 112* 108* 98 69* 84 120* 91  BUN 22* 25* 27* 26* 27* 27* 27*  CREATININE 1.14* 1.16* 1.22* 1.13* 1.24* 1.31* 1.36*  CALCIUM 7.3* 7.3* 7.7* 8.2* 8.3* 8.4* 8.6*  MG 2.0 1.9 2.2 2.4  --   --    --   PHOS 3.7 4.0 5.1* 5.2*  --   --   --    GFR: Estimated Creatinine Clearance: 54.9 mL/min (A) (by C-G formula based on SCr of 1.36 mg/dL (H)). Liver Function Tests: Recent Labs  Lab 12/21/20 0550 12/22/20 0625  AST 27 26  ALT 17 17  ALKPHOS 60 56  BILITOT 0.7 0.7  PROT 5.2* 4.8*  ALBUMIN 2.6* 2.5*    CBG: Recent Labs  Lab 12/26/20 1823 12/26/20 2349 12/27/20 0647 12/27/20 0830 12/27/20 1210  GLUCAP 105* 98 104* 115* 100*     Recent Results (from the past 240 hour(s))  OB Urine Culture     Status: Abnormal   Collection Time: 12/20/20  6:29 AM   Specimen: OB Catheterized; Urine  Result Value Ref Range Status   Specimen Description OB CATHETERIZED  Final   Special Requests NONE  Final   Culture (A)  Final    <10,000 COLONIES/mL INSIGNIFICANT GROWTH NO GROUP B STREP (S.AGALACTIAE) ISOLATED Performed at [facility], [address], [place], [place]    Report Status 12/21/2020 FINAL  Final  Wet prep, genital     Status: None   Collection Time: 12/20/20  5:41 PM   Specimen: Vaginal  Result Value Ref Range Status   Yeast Wet Prep HPF POC NONE SEEN NONE SEEN Final   Trich, Wet Prep NONE SEEN NONE SEEN Final   Clue Cells Wet Prep HPF POC NONE SEEN NONE SEEN Final   WBC, Wet Prep HPF POC NONE SEEN NONE SEEN Final   Sperm NONE SEEN  Final    Comment: Performed at [facility], [address], [place], [place]      Radiology Studies: [id number] Abdomen Limited  Result Date: 12/26/2020 CLINICAL DATA:  Palpable mass of left lower abdomen, pain EXAM: ULTRASOUND ABDOMEN LIMITED COMPARISON:  None. FINDINGS: Targeted ultrasound examination of the patient identified area of pain and palpable abnormality in the left lower quadrant reveals a multilobulated cystic lesion measuring 1.2 x 1.0 x 1.2 cm in the subcutaneous fat. IMPRESSION: Multiloculated cystic lesion in the subcutaneous fat of the left lower abdomen. This may reflect a small abscess if  clinically referable signs and symptoms are patent. Electronically Signed   By: 02/25/2021 M.D.   On: 12/26/2020 15:02     Scheduled Meds:  furosemide       bacitracin   Topical BID   Chlorhexidine Gluconate Cloth  6 each Topical Daily   doxylamine (Sleep)  12.5 mg Oral QHS   enoxaparin  70 mg Subcutaneous Q12H   feeding supplement (PROSource TF)  45 mL Per Tube BID   [START  ON 12/28/2020] ferrous sulfate  300 mg Per Tube QODAY   folic acid  1 mg Intravenous Daily   furosemide  20 mg Intravenous Once   hydrALAZINE  25 mg Per Tube Q8H   mouth rinse  15 mL Mouth Rinse q12n4p   metoCLOPramide (REGLAN) injection  5 mg Intravenous Q6H   pantoprazole (PROTONIX) IV  40 mg Intravenous Q12H   prenatal multivitamin  1 tablet Per Tube Q1200   scopolamine  1 patch Transdermal Q72H   sodium chloride flush  10-40 mL Intracatheter Q12H   sodium chloride flush  3 mL Intravenous Q12H   vitamin B-6  12.5 mg Oral QHS   Continuous Infusions:  sodium chloride Stopped (12/19/20 1700)   albumin human 12.5 g (12/27/20 1157)   feeding supplement (OSMOLITE 1.5 CAL) 1,000 mL (12/26/20 2337)   insulin 0.8 Units/hr (12/27/20 0635)   lactated ringers 10 mL/hr at 12/25/20 1957   promethazine (PHENERGAN)25mg  in 23mL NS 25 mg (12/26/20 2255)   thiamine injection 500 mg (12/26/20 1516)     LOS: 11 days    Reva Bores, MD 12/27/2020 12:59 PM [phone] [facility] If 7PM-7AM, please contact night-coverage 12/27/2020, 12:59 PM

## 2020-12-27 NOTE — Progress Notes (Signed)
1220 upon assessment, pt stated not feeling well. Upon further assessment, pt presented with bilateral JVD, headache, chest, neck back and shoulder pain. Vital signs WNL, Freestyle BS 100. MD notified and rapid response called. Orders placed and medication given.

## 2020-12-27 NOTE — Significant Event (Signed)
Rapid Response Event Note   Reason for Call :  Chest pain, JVD  Initial Focused Assessment:  Patient is lying in bed she is mildly diaphoretic and a little pale.  She states her chest pain is a little better but that her neck still hurts.  She has JVD and crackles in her lung bases.  Heart tones regular. She does endorse some mild shortness of breath. She has 3+ edema  BP 146/66  HR 103  RR 24  O2 sat 96% on 2L [place]  Dr Shawnie Pons at bedside to assess patient   Interventions:  12 lead EKG PCXR BNP and Troponin drawn Total 40 mg lasix given IV  Dr Macon Large at bedside to assess patient (Cardiology consult)    Plan of Care:  RN to call if patient complains of chest pain again or becomes short of breath.    2D echo  Event Summary:   MD Notified: Shawnie Pons Call Time: 1227 Arrival Time: 1229 End Time: 1335  Marcellina Millin, RN

## 2020-12-27 NOTE — Plan of Care (Signed)
  Problem: Fluid Volume: Goal: Ability to maintain a balanced intake and output will improve Outcome: Progressing Note: Pt presents with signs of fluid and electrolyte imbalance; fluid overload, and edema. Measures taken to reduce edema and fluid volume overload. Pt responding to treatment. JVD has decreased and pt diuresing.

## 2020-12-27 NOTE — Progress Notes (Signed)
Progress Note  Patient Name: Ashley Camacho Date of Encounter: 12/27/2020  [facility] Cardiologist: None   Subjective   Rapid response in room  - called for chest pain and dyspnea Patient states her neck hurts. Slightly increased work of breathing.   Inpatient Medications    Scheduled Meds:  bacitracin   Topical BID   Chlorhexidine Gluconate Cloth  6 each Topical Daily   doxylamine (Sleep)  12.5 mg Oral QHS   enoxaparin  70 mg Subcutaneous Q12H   feeding supplement (PROSource TF)  45 mL Per Tube BID   [START ON 12/28/2020] ferrous sulfate  300 mg Per Tube QODAY   folic acid  1 mg Intravenous Daily   hydrALAZINE  25 mg Per Tube Q8H   mouth rinse  15 mL Mouth Rinse q12n4p   metoCLOPramide (REGLAN) injection  5 mg Intravenous Q6H   pantoprazole (PROTONIX) IV  40 mg Intravenous Q12H   prenatal multivitamin  1 tablet Per Tube Q1200   scopolamine  1 patch Transdermal Q72H   sodium chloride flush  10-40 mL Intracatheter Q12H   sodium chloride flush  3 mL Intravenous Q12H   vitamin B-6  12.5 mg Oral QHS   Continuous Infusions:  sodium chloride Stopped (12/19/20 1700)   albumin human 12.5 g (12/27/20 1157)   feeding supplement (OSMOLITE 1.5 CAL) 1,000 mL (12/26/20 2337)   insulin 0.8 Units/hr (12/27/20 0635)   lactated ringers 10 mL/hr at 12/25/20 1957   promethazine (PHENERGAN)25mg  in 33mL NS 25 mg (12/26/20 2255)   thiamine injection 500 mg (12/26/20 1516)   PRN Meds: sodium chloride, acetaminophen **OR** acetaminophen, dextrose, guaiFENesin-dextromethorphan, loperamide, ondansetron, phenol, prochlorperazine, promethazine (PHENERGAN)25mg  in 11mL NS, sodium chloride flush, sodium chloride flush   Vital Signs    Vitals:   12/27/20 1210 12/27/20 1215 12/27/20 1220 12/27/20 1225  BP:  (!) 142/60    Pulse:  (!) 104    Resp:      Temp:      TempSrc:      SpO2: 93% 91% 96% 97%  Weight:      Height:        Intake/Output Summary (Last 24 hours) at 12/27/2020 1236 Last  data filed at 12/27/2020 1017 Gross per 24 hour  Intake 1964.39 ml  Output 850 ml  Net 1114.39 ml   Last 3 Weights 12/27/2020 12/26/2020 12/25/2020  Weight (lbs) 155 lb 14.4 oz 153 lb 8 oz 153 lb 3.5 oz  Weight (kg) 70.716 kg 69.627 kg 69.5 kg      Telemetry    ST- Personally Reviewed  ECG    ST, low voltage, no ST changes. Reviewed at bedside- Personally Reviewed  Physical Exam   VS:  BP (!) 142/60   Pulse (!) 104   Temp 97.9 F (36.6 C) (Oral)   Resp 18   Ht 5\' 1"  (1.549 m)   Wt 70.7 kg   LMP  (LMP Unknown)   SpO2 97%   BMI 29.46 kg/m  , BMI Body mass index is 29.46 kg/m.  increase work of breathing.  Neck: JVD+ Cardiac: RRR, no murmurs, rubs, or gallops.  Respiratory: diminished bilateral bases, crackles to midlung. GI: Soft, nontender, non-distended, gravid abd. MS: diffuse puffy 2+ ; No deformity. Warm and wet on exam. Neuro:  Nonfocal  Psych: flat affect    Labs    High Sensitivity Troponin:  No results for input(s): TROPONINIHS in the last 720 hours.    Chemistry Recent Labs  Lab  12/21/20 0550 12/22/20 0625 12/23/20 0527 12/25/20 0640 12/26/20 0330 12/27/20 0500  NA 140 139   < > 139 141 141  K 4.2 3.8   < > 4.6 4.4 4.4  CL 103 103   < > 106 106 108  CO2 29 29   < > 26 29 27   GLUCOSE 112* 108*   < > 84 120* 91  BUN 22* 25*   < > 27* 27* 27*  CREATININE 1.14* 1.16*   < > 1.24* 1.31* 1.36*  CALCIUM 7.3* 7.3*   < > 8.3* 8.4* 8.6*  PROT 5.2* 4.8*  --   --   --   --   ALBUMIN 2.6* 2.5*  --   --   --   --   AST 27 26  --   --   --   --   ALT 17 17  --   --   --   --   ALKPHOS 60 56  --   --   --   --   BILITOT 0.7 0.7  --   --   --   --   GFRNONAA >60 >60   < > >60 57* 54*  ANIONGAP 8 7   < > 7 6 6    < > = values in this interval not displayed.     Hematology Recent Labs  Lab 12/25/20 0640 12/26/20 0330 12/27/20 0500  WBC 8.0 7.6 8.6  RBC 2.78* 2.51* 2.86*  HGB 8.1* 7.2* 8.3*  HCT 25.4* 23.2* 26.6*  MCV 91.4 92.4 93.0  MCH 29.1  28.7 29.0  MCHC 31.9 31.0 31.2  RDW 15.4 15.4 15.6*  PLT 237 235 211    BNP Recent Labs  Lab 12/23/20 0833  BNP 480.0*     DDimer No results for input(s): DDIMER in the last 168 hours.   Radiology    02/26/21 Abdomen Limited  Result Date: 12/26/2020 CLINICAL DATA:  Palpable mass of left lower abdomen, pain EXAM: ULTRASOUND ABDOMEN LIMITED COMPARISON:  None. FINDINGS: Targeted ultrasound examination of the patient identified area of pain and palpable abnormality in the left lower quadrant reveals a multilobulated cystic lesion measuring 1.2 x 1.0 x 1.2 cm in the subcutaneous fat. IMPRESSION: Multiloculated cystic lesion in the subcutaneous fat of the left lower abdomen. This may reflect a small abscess if clinically referable signs and symptoms are patent. Electronically Signed   By: [place] M.D.   On: 12/26/2020 15:02    Cardiac Studies   Echo 12/17/20: 1. Left ventricular ejection fraction, by estimation, is 40 to 45%. The  left ventricle has mildly decreased function. The left ventricle  demonstrates global hypokinesis. Left ventricular diastolic parameters are  indeterminate.   2. Right ventricular systolic function is normal. The right ventricular  size is mildly enlarged. There is normal pulmonary artery systolic  pressure. The estimated right ventricular systolic pressure is 29.2 mmHg.   3. Right atrial size was mildly dilated.   4. A small pericardial effusion is present.   5. The mitral valve is normal in structure. Mild to moderate mitral valve  regurgitation.   6. The aortic valve is tricuspid. Aortic valve regurgitation is not  visualized. No aortic stenosis is present.   7. The inferior vena cava is normal in size with greater than 50%  respiratory variability, suggesting right atrial pressure of 3 mmHg.   Patient Profile     30 y.o. female [redacted] weeks pregnant with type 1 diabetes, anemia, and  acute systolic and diastolic heart failure.  Previously treated at [facility] for  sepsis and PNA.  Assessment & Plan    #Acute systolic and also heart failure: # AKI: #DM1: LVEF 40 to 45%.  Etiology unclear.  Occurred too early for peripartum cardiomyopathy.  Given her hyperemesis gravidarum it is thought that nutritional deficiency is most likely diagnosis.   - chest pain today with increased SOB, not currently hypoxic on O2 but evidence of gross volume overload on exam. - clinical change from prior earlier this am per Dr. Theodoro Parma who is at bedside. - Lasix held in last few days due to AKI. Will give lasix 40 mg IV now and monitor for response.  - She is net positive 2 L, aim for net even if possible.  - CXR pending, suspect we will see interstitial edema and possible pleural effusions based on exam. - ecg with low voltage though this is not entirely new. If she becomes hypotensive recommend stat echo for evaluation of pericardial effusion for tamponade physiology. - May want to get an echo tomorrow morning to reassess LV function, we will see how she progresses today.  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 Receiving daily albumin.  # Hypertension:  BP stable on hydralazine. Monitor for hypotension.   # UE DVT: On Lovenox. Not strongly suspicious for PE at this time.   For questions or updates, please contact [facility] Please consult [email or web address] for contact info under        Signed, Parke Poisson, MD  12/27/2020, 12:36 PM

## 2020-12-27 NOTE — Progress Notes (Signed)
Patient ID: Ashley Camacho, female   DOB: 16-Jan-1991, 30 y.o.   MRN: [id number] [facility] ANTEPARTUM COMPREHENSIVE PROGRESS NOTE  Ashley Camacho is a 30 y.o. G2P0101 at [redacted]w[redacted]d  who is admitted to IM service for managements of Type 1 DM complications in setting of second trimester pregnancy.   Fetal presentation is breech. U/S 12/22/20 Length of Stay:  11  Days  Subjective: No obstetric complaints this morning Patient reports good fetal movement.  She reports no uterine contractions, no bleeding and no loss of fluid per vagina.  Vitals:  Blood pressure (!) 142/67, pulse (!) 102, temperature 97.9 F (36.6 C), temperature source Oral, resp. rate 18, height 5\' 1"  (1.549 m), weight 70.7 kg, SpO2 92 %. Physical Examination: Gen NAD Lungs normal breath sounds Heart RRR Abd soft, NT, gravid Ext 1-2 + edema  Fetal Monitoring:  Baseline: 140's bpm, Variability: Moderate {6-25 bpm), Accelerations: Reactive, and Decelerations: Absent  Labs:  Results for orders placed or performed during the hospital encounter of 12/16/20 (from the past 24 hour(s))  Glucose, capillary   Collection Time: 12/26/20 10:48 AM  Result Value Ref Range   Glucose-Capillary 114 (H) 70 - 99 mg/dL  Glucose, capillary   Collection Time: 12/26/20  2:52 PM  Result Value Ref Range   Glucose-Capillary 86 70 - 99 mg/dL  Glucose, capillary   Collection Time: 12/26/20  6:23 PM  Result Value Ref Range   Glucose-Capillary 105 (H) 70 - 99 mg/dL  Glucose, capillary   Collection Time: 12/26/20 11:49 PM  Result Value Ref Range   Glucose-Capillary 98 70 - 99 mg/dL  CBC   Collection Time: 12/27/20  5:00 AM  Result Value Ref Range   WBC 8.6 4.0 - 10.5 K/uL   RBC 2.86 (L) 3.87 - 5.11 MIL/uL   Hemoglobin 8.3 (L) 12.0 - 15.0 g/dL   HCT 02/26/21 (L) 67.3 - 41.9 %   MCV 93.0 80.0 - 100.0 fL   MCH 29.0 26.0 - 34.0 pg   MCHC 31.2 30.0 - 36.0 g/dL   RDW 37.9 (H) 02.4 - 09.7 %   Platelets 211 150 - 400 K/uL   nRBC 0.0 0.0 - 0.2 %   Basic metabolic panel   Collection Time: 12/27/20  5:00 AM  Result Value Ref Range   Sodium 141 135 - 145 mmol/L   Potassium 4.4 3.5 - 5.1 mmol/L   Chloride 108 98 - 111 mmol/L   CO2 27 22 - 32 mmol/L   Glucose, Bld 91 70 - 99 mg/dL   BUN 27 (H) 6 - 20 mg/dL   Creatinine, Ser 02/26/21 (H) 0.44 - 1.00 mg/dL   Calcium 8.6 (L) 8.9 - 10.3 mg/dL   GFR, Estimated 54 (L) >60 mL/min   Anion gap 6 5 - 15  Glucose, capillary   Collection Time: 12/27/20  6:47 AM  Result Value Ref Range   Glucose-Capillary 104 (H) 70 - 99 mg/dL  Glucose, capillary   Collection Time: 12/27/20  8:30 AM  Result Value Ref Range   Glucose-Capillary 115 (H) 70 - 99 mg/dL   Comment 1 Notify RN    Comment 2 Document in Chart     Imaging Studies:    NA   Medications:  Scheduled  bacitracin   Topical BID   Chlorhexidine Gluconate Cloth  6 each Topical Daily   doxylamine (Sleep)  12.5 mg Oral QHS   enoxaparin  70 mg Subcutaneous Q12H   feeding supplement (PROSource TF)  45 mL  Per Tube BID   [START ON 12/28/2020] ferrous sulfate  300 mg Per Tube QODAY   folic acid  1 mg Intravenous Daily   hydrALAZINE  25 mg Per Tube Q8H   mouth rinse  15 mL Mouth Rinse q12n4p   metoCLOPramide (REGLAN) injection  5 mg Intravenous Q6H   pantoprazole (PROTONIX) IV  40 mg Intravenous Q12H   prenatal multivitamin  1 tablet Per Tube Q1200   scopolamine  1 patch Transdermal Q72H   sodium chloride flush  10-40 mL Intracatheter Q12H   sodium chloride flush  3 mL Intravenous Q12H   vitamin B-6  12.5 mg Oral QHS   I have reviewed the patient's current medications.  ASSESSMENT & Plan  IUP [redacted]w[redacted]d Multiple medical complications related to Type 1 DM  Stable for OB stand point. MFM consult on chart. They will see again next week and make recommendations on timing of delivery. But do not expect prior to 28 weeks, goal would be 32-34 weeks if medically possible. Fetal well being reassuring at present. Appreciate IM and Cardiology care for  pt.   Jaynie Collins, MD 12/27/2020,9:49 AM

## 2020-12-27 NOTE — Progress Notes (Deleted)
Patient ID: Ashley Camacho, female   DOB: 16-Jan-1991, 30 y.o.   MRN: [id number] [facility] ANTEPARTUM COMPREHENSIVE PROGRESS NOTE  Ashley Camacho is a 30 y.o. G2P0101 at [redacted]w[redacted]d  who is admitted to IM service for managements of Type 1 DM complications in setting of second trimester pregnancy.   Fetal presentation is breech. U/S 12/22/20 Length of Stay:  11  Days  Subjective: No obstetric complaints this morning Patient reports good fetal movement.  She reports no uterine contractions, no bleeding and no loss of fluid per vagina.  Vitals:  Blood pressure (!) 142/67, pulse (!) 102, temperature 97.9 F (36.6 C), temperature source Oral, resp. rate 18, height 5\' 1"  (1.549 m), weight 70.7 kg, SpO2 92 %. Physical Examination: Gen NAD Lungs normal breath sounds Heart RRR Abd soft, NT, gravid Ext 1-2 + edema  Fetal Monitoring:  Baseline: 140's bpm, Variability: Moderate {6-25 bpm), Accelerations: Reactive, and Decelerations: Absent  Labs:  Results for orders placed or performed during the hospital encounter of 12/16/20 (from the past 24 hour(s))  Glucose, capillary   Collection Time: 12/26/20 10:48 AM  Result Value Ref Range   Glucose-Capillary 114 (H) 70 - 99 mg/dL  Glucose, capillary   Collection Time: 12/26/20  2:52 PM  Result Value Ref Range   Glucose-Capillary 86 70 - 99 mg/dL  Glucose, capillary   Collection Time: 12/26/20  6:23 PM  Result Value Ref Range   Glucose-Capillary 105 (H) 70 - 99 mg/dL  Glucose, capillary   Collection Time: 12/26/20 11:49 PM  Result Value Ref Range   Glucose-Capillary 98 70 - 99 mg/dL  CBC   Collection Time: 12/27/20  5:00 AM  Result Value Ref Range   WBC 8.6 4.0 - 10.5 K/uL   RBC 2.86 (L) 3.87 - 5.11 MIL/uL   Hemoglobin 8.3 (L) 12.0 - 15.0 g/dL   HCT 02/26/21 (L) 67.3 - 41.9 %   MCV 93.0 80.0 - 100.0 fL   MCH 29.0 26.0 - 34.0 pg   MCHC 31.2 30.0 - 36.0 g/dL   RDW 37.9 (H) 02.4 - 09.7 %   Platelets 211 150 - 400 K/uL   nRBC 0.0 0.0 - 0.2 %   Basic metabolic panel   Collection Time: 12/27/20  5:00 AM  Result Value Ref Range   Sodium 141 135 - 145 mmol/L   Potassium 4.4 3.5 - 5.1 mmol/L   Chloride 108 98 - 111 mmol/L   CO2 27 22 - 32 mmol/L   Glucose, Bld 91 70 - 99 mg/dL   BUN 27 (H) 6 - 20 mg/dL   Creatinine, Ser 02/26/21 (H) 0.44 - 1.00 mg/dL   Calcium 8.6 (L) 8.9 - 10.3 mg/dL   GFR, Estimated 54 (L) >60 mL/min   Anion gap 6 5 - 15  Glucose, capillary   Collection Time: 12/27/20  6:47 AM  Result Value Ref Range   Glucose-Capillary 104 (H) 70 - 99 mg/dL  Glucose, capillary   Collection Time: 12/27/20  8:30 AM  Result Value Ref Range   Glucose-Capillary 115 (H) 70 - 99 mg/dL   Comment 1 Notify RN    Comment 2 Document in Chart     Imaging Studies:    NA   Medications:  Scheduled  bacitracin   Topical BID   Chlorhexidine Gluconate Cloth  6 each Topical Daily   doxylamine (Sleep)  12.5 mg Oral QHS   enoxaparin  70 mg Subcutaneous Q12H   feeding supplement (PROSource TF)  45 mL  Per Tube BID   [START ON 12/28/2020] ferrous sulfate  300 mg Per Tube QODAY   folic acid  1 mg Intravenous Daily   hydrALAZINE  25 mg Per Tube Q8H   mouth rinse  15 mL Mouth Rinse q12n4p   metoCLOPramide (REGLAN) injection  5 mg Intravenous Q6H   pantoprazole (PROTONIX) IV  40 mg Intravenous Q12H   prenatal multivitamin  1 tablet Per Tube Q1200   scopolamine  1 patch Transdermal Q72H   sodium chloride flush  10-40 mL Intracatheter Q12H   sodium chloride flush  3 mL Intravenous Q12H   vitamin B-6  12.5 mg Oral QHS   I have reviewed the patient's current medications.  ASSESSMENT & Plan  IUP 26 457 weeks Multiple medical complications related to Type 1 DM  Stable for OB stand point. MFM consult on chart. They will see again next week and make recommendations on timing of delivery. But do not expect prior to 28 weeks, goal would be 32-34 weeks if medically possible. Fetal well being reassuring at present. Appreciate IM and Cardiology  care for pt.   Jaynie Collins, MD 12/27/2020,9:47 AM

## 2020-12-28 ENCOUNTER — Inpatient Hospital Stay (HOSPITAL_COMMUNITY): Payer: Medicaid Other

## 2020-12-28 DIAGNOSIS — I313 Pericardial effusion (noninflammatory): Secondary | ICD-10-CM

## 2020-12-28 DIAGNOSIS — I5021 Acute systolic (congestive) heart failure: Secondary | ICD-10-CM

## 2020-12-28 LAB — CBC
HCT: 25.5 % — ABNORMAL LOW (ref 36.0–46.0)
Hemoglobin: 8.3 g/dL — ABNORMAL LOW (ref 12.0–15.0)
MCH: 29.9 pg (ref 26.0–34.0)
MCHC: 32.5 g/dL (ref 30.0–36.0)
MCV: 91.7 fL (ref 80.0–100.0)
Platelets: 195 10*3/uL (ref 150–400)
RBC: 2.78 MIL/uL — ABNORMAL LOW (ref 3.87–5.11)
RDW: 15.5 % (ref 11.5–15.5)
WBC: 6.7 10*3/uL (ref 4.0–10.5)
nRBC: 0 % (ref 0.0–0.2)

## 2020-12-28 LAB — GLUCOSE, CAPILLARY
Glucose-Capillary: 108 mg/dL — ABNORMAL HIGH (ref 70–99)
Glucose-Capillary: 116 mg/dL — ABNORMAL HIGH (ref 70–99)
Glucose-Capillary: 114 mg/dL — ABNORMAL HIGH (ref 70–99)
Glucose-Capillary: 62 mg/dL — ABNORMAL LOW (ref 70–99)
Glucose-Capillary: 124 mg/dL — ABNORMAL HIGH (ref 70–99)
Glucose-Capillary: 132 mg/dL — ABNORMAL HIGH (ref 70–99)

## 2020-12-28 LAB — BASIC METABOLIC PANEL
Anion gap: 8 (ref 5–15)
BUN: 30 mg/dL — ABNORMAL HIGH (ref 6–20)
CO2: 26 mmol/L (ref 22–32)
Calcium: 8.7 mg/dL — ABNORMAL LOW (ref 8.9–10.3)
Chloride: 108 mmol/L (ref 98–111)
Creatinine, Ser: 1.34 mg/dL — ABNORMAL HIGH (ref 0.44–1.00)
GFR, Estimated: 55 mL/min — ABNORMAL LOW (ref >60)
Glucose, Bld: 113 mg/dL — ABNORMAL HIGH (ref 70–99)
Potassium: 4.5 mmol/L (ref 3.5–5.1)
Sodium: 142 mmol/L (ref 135–145)

## 2020-12-28 LAB — ECHOCARDIOGRAM LIMITED
Height: 61 in
Weight: 2459.2 oz

## 2020-12-28 MED ORDER — FUROSEMIDE 10 MG/ML IJ SOLN
40.0000 mg | Freq: Once | INTRAMUSCULAR | Status: AC
  Administered 2020-12-28: 40 mg via INTRAVENOUS
  Filled 2020-12-28: qty 4

## 2020-12-28 MED ORDER — COMPLETENATE 29-1 MG PO CHEW
1.0000 | CHEWABLE_TABLET | Freq: Every day | ORAL | Status: DC
  Administered 2020-12-29 – 2021-02-16 (×49): 1 via ORAL
  Filled 2020-12-28 (×49): qty 1

## 2020-12-28 NOTE — Progress Notes (Signed)
I agree with the telemetry assessment for tele box T-01.

## 2020-12-28 NOTE — Progress Notes (Signed)
PROGRESS NOTE    Ashley Camacho  VFI:[id number] DOB: [birth year] DOA: 12/16/2020 PCP: Hildred Laser, MD    No chief complaint on file.   Brief Narrative:  30 year old female G2,P1 at [redacted] weeks gestation who has a history of type 1 diabetes mellitus.  She presented to the emergency room for evaluation of persistent nausea and vomiting .  She was recently with multiple hospitalization, at [facility] for sepsis secondary to pneumonia and was there for 2 weeks.  Couple hospitalization at [facility] due to intractable nausea and vomiting. -Transferred from [facility] to [facility] 7/26 given her severe malnutrition, with hyperemesis gravidarum, for consideration of cortrak tube insertion.  Patient had core track inserted on admission, so far tolerating tube feed (she remains with significant nausea and vomiting, but this is at baseline and did not worsen with initiation of tube feed), as well her work-up was significant for acute CHF, requiring diuresis which is managed by cards  Subjective:  She is on room air, she is edematous,  She is tolerating tube feeds, still having n/v but small amount, she is ambulating in room  Assessment & Plan:   Active Problems:   Anemia affecting pregnancy in second trimester   AKI (acute kidney injury) (HCC)   Hyperemesis   Pregnancy with 24 completed weeks gestation   DKA, type 1, not at goal [facility])   Starvation ketoacidosis   Acute deep vein thrombosis (DVT) of brachial vein of right upper extremity (HCC)   Acute respiratory failure with hypoxia (HCC)   Vitreous hemorrhage of right eye (HCC)   Gastroparesis due to DM (HCC)   Diabetic retinopathy (HCC)   Acute on chronic combined systolic and diastolic CHF (congestive heart failure) (HCC)   Type 1 diabetes mellitus affecting pregnancy in second trimester, antepartum   HFrEF (heart failure with reduced ejection fraction) (HCC)   Severe protein calorie malnutrition/hyperemesis gravidarum with  underlying baseline gastroparesis Was on TPN, TPN discontinued on 7/28 ,now she is status post core track placement on 7/27, tolerating tube feeds at 50 cc/h -She received IV albumin from 8/4-8/7 -Was on steroid hyperemesis gravidarum protocol which is discontinued as it did not help -Remeron discontinued due to prolonged QTC, currently on reglan, Zofran, scopolamine patch, Protonix, Compazine, Phenergan Monitor Qtc  Type 1 diabetes, uncontrolled with hyperglycemia and hypoglycemia -remained on insulin drip  Cardiomyopathy/acute systolic heart failure Currently on hydralazine, and as needed Lasix Volume overloaded on exam Repeat echocardiogram ordered by cardiology Management per cardiology  Right upper extremity DVT On Lovenox  CKD 2 Proteinuria Monitor renal function Renal dosing medication   Body mass index is 29.04 kg/m.Marland Kitchen Seen by dietician.  I agree with the assessment and plan as outlined below: Nutrition Status: Nutrition Problem: Inadequate oral intake Etiology: nausea, vomiting Signs/Symptoms: NPO status Interventions: TPN, Tube feeding  .    Unresulted Labs (From admission, onward)     Start     Ordered   12/29/20 0500  Basic metabolic panel  Tomorrow morning,   R       Question:  Specimen collection method  Answer:  Unit=Unit collect   12/28/20 1616   12/29/20 0500  Magnesium  Tomorrow morning,   R       Question:  Specimen collection method  Answer:  Unit=Unit collect   12/28/20 1616   12/29/20 0330  Low molecular wgt heparin (fractionated)  Once-Timed,   TIMED       Comments: This must be drawn between 4-6 hours after  dose is given (due at 2200), Thanks   Question:  Specimen collection method  Answer:  IV Team=IV Team collect   12/28/20 1416   12/20/20 2301  Type and screen [facility]  Every 72 hours,   R     Comments: [facility]    12/20/20 2301   12/20/20 0500  Glucose, fasting - daily while on taper  Daily,   R      Comments: Notify Physician if fasting blood sugar > 95.   Question:  Specimen collection method  Answer:  IV Team=IV Team collect   12/19/20 0655   12/19/20 0500  Glucose, fasting - daily while on taper  Daily,   R     Comments: Notify Physician if fasting blood sugar > 95.   Question:  Specimen collection method  Answer:  IV Team=IV Team collect   12/18/20 1326              DVT prophylaxis: SCDs Start: 12/18/20 1433   Code Status:full Family Communication: pateint Disposition:   Status is: Inpatient   Dispo: The patient is from: Home              Anticipated d/c is to: Home              Anticipated d/c date is: To be determined              Patient currently is not medically stable to discharge  Consultants:  OB/maternal-fetal medicine as primary Cardiology as consultant [facility] as consultant  Procedures:  Core track placement PICC line placement on 7/27  Antimicrobials:   None Anti-infectives (From admission, onward)    None           Objective: Vitals:   12/28/20 1550 12/28/20 1555 12/28/20 1600 12/28/20 1605  BP:      Pulse:      Resp:      Temp:      TempSrc:      SpO2: 91% 91% 90% 91%  Weight:      Height:        Intake/Output Summary (Last 24 hours) at 12/28/2020 1621 Last data filed at 12/28/2020 1545 Gross per 24 hour  Intake 2373.51 ml  Output 2510 ml  Net -136.49 ml   Filed Weights   12/26/20 0545 12/27/20 0710 12/28/20 0500  Weight: 69.6 kg 70.7 kg 69.7 kg    Examination:  General exam: calm, NAD Respiratory system: diminished. Respiratory effort normal. Cardiovascular system: RRR. + pedal edema. Gastrointestinal system: Gravid abdomen. Central nervous system: Alert and oriented. No focal neurological deficits. Extremities:bilateral lower extremity pitting edema Skin: No rashes, lesions or ulcers Psychiatry: Judgement and insight appear normal. Mood & affect appropriate.     Data Reviewed: I have  personally reviewed following labs and imaging studies  CBC: Recent Labs  Lab 12/24/20 0547 12/25/20 0640 12/26/20 0330 12/27/20 0500 12/28/20 0658  WBC 8.8 8.0 7.6 8.6 6.7  HGB 8.0* 8.1* 7.2* 8.3* 8.3*  HCT 25.4* 25.4* 23.2* 26.6* 25.5*  MCV 91.7 91.4 92.4 93.0 91.7  PLT 239 237 235 211 195    Basic Metabolic Panel: Recent Labs  Lab 12/22/20 0625 12/23/20 0527 12/24/20 0547 12/25/20 0640 12/26/20 0330 12/27/20 0500 12/28/20 0658  NA 139 139 140 139 141 141 142  K 3.8 4.4 4.6 4.6 4.4 4.4 4.5  CL 103 103 102 106 106 108 108  CO2 29 27 28 26 29 27 26   GLUCOSE  108* 98 69* 84 120* 91 113*  BUN 25* 27* 26* 27* 27* 27* 30*  CREATININE 1.16* 1.22* 1.13* 1.24* 1.31* 1.36* 1.34*  CALCIUM 7.3* 7.7* 8.2* 8.3* 8.4* 8.6* 8.7*  MG 1.9 2.2 2.4  --   --   --   --   PHOS 4.0 5.1* 5.2*  --   --   --   --     GFR: Estimated Creatinine Clearance: 55.4 mL/min (A) (by C-G formula based on SCr of 1.34 mg/dL (H)).  Liver Function Tests: Recent Labs  Lab 12/22/20 0625  AST 26  ALT 17  ALKPHOS 56  BILITOT 0.7  PROT 4.8*  ALBUMIN 2.5*    CBG: Recent Labs  Lab 12/28/20 0139 12/28/20 0636 12/28/20 1026 12/28/20 1424 12/28/20 1450  GLUCAP 108* 116* 114* 62* 124*     Recent Results (from the past 240 hour(s))  OB Urine Culture     Status: Abnormal   Collection Time: 12/20/20  6:29 AM   Specimen: OB Catheterized; Urine  Result Value Ref Range Status   Specimen Description OB CATHETERIZED  Final   Special Requests NONE  Final   Culture (A)  Final    <10,000 COLONIES/mL INSIGNIFICANT GROWTH NO GROUP B STREP (S.AGALACTIAE) ISOLATED Performed at [facility], [address], [place], [place] [address]    Report Status 12/21/2020 FINAL  Final  Wet prep, genital     Status: None   Collection Time: 12/20/20  5:41 PM   Specimen: Vaginal  Result Value Ref Range Status   Yeast Wet Prep HPF POC NONE SEEN NONE SEEN Final   Trich, Wet Prep NONE SEEN NONE SEEN Final    Clue Cells Wet Prep HPF POC NONE SEEN NONE SEEN Final   WBC, Wet Prep HPF POC NONE SEEN NONE SEEN Final   Sperm NONE SEEN  Final    Comment: Performed at [facility], [address] [place], [place] [address]         Radiology Studies: DG Chest Port 1 View  Result Date: 12/27/2020 CLINICAL DATA:  Respiratory distress. EXAM: PORTABLE CHEST 1 VIEW COMPARISON:  12/23/2020 FINDINGS: Cardiomegaly with pulmonary vascular congestion and slightly increased interstitial opacities/edema noted. Slightly increasing pleural effusions noted, small to moderate on the RIGHT and trace to small on the LEFT. Bibasilar opacities are increased, favor atelectasis/edema. A LEFT PICC line with tip overlying SUPERIOR cavoatrial junction and small bore feeding tube entering the stomach with tip off the field of view noted. There is no evidence of pneumothorax. IMPRESSION: 1. Cardiomegaly with pulmonary vascular congestion and slightly increased interstitial opacities/edema. 2. Slightly increasing pleural effusions, small to moderate on the RIGHT and trace to small on the LEFT. 3. Increased bibasilar opacities, favor atelectasis or edema. Electronically Signed   By: Harmon Pier M.D.   On: 12/27/2020 13:41   ECHOCARDIOGRAM LIMITED  Result Date: 12/28/2020    ECHOCARDIOGRAM LIMITED REPORT   Patient Name:   Ashley Camacho Date of Exam: 12/28/2020 Medical Rec #:  [id number]       Height:       61.0 in Accession #:    [id number]      Weight:       153.7 lb Date of Birth:  06/10/1990      BSA:          1.689 m Patient Age:    29 years        BP:  122/49 mmHg Patient Gender: F               HR:           97 bpm. Exam Location:  Inpatient Procedure: Cardiac Doppler, Color Doppler and Limited Echo Indications:    Pericardial effusion                 CHF-Acute systolic  History:        Patient has prior history of Echocardiogram examinations, most                 recent 12/17/2020. Risk Factors:Hypertension and Diabetes.   Sonographer:    Ross Ludwig RDCS (AE) Referring Phys: [id number] Parke Poisson  Sonographer Comments: [redacted] weeks pregnant at time of study IMPRESSIONS  1. A small pericardial effusion is present. The pericardial effusion is posterior to the left ventricle. There is no evidence of cardiac tamponade.  2. Left ventricular ejection fraction, by estimation, is 50 to 55%. The left ventricle has low normal function.  3. Right ventricular systolic function is normal. The right ventricular size is normal. There is normal pulmonary artery systolic pressure. The estimated right ventricular systolic pressure is 34.4 mmHg.  4. The mitral valve is grossly normal. Mild to moderate mitral valve regurgitation.  5. Tricuspid valve regurgitation is moderate.  6. The aortic valve is normal in structure. Aortic valve regurgitation is not visualized.  7. The inferior vena cava is normal in size with <50% respiratory variability, suggesting right atrial pressure of 8 mmHg. Comparison(s): A prior study was performed on 12/17/20. Prior images reviewed side by side. LVEF has improved slightly. No change in pericardial effusion. FINDINGS  Left Ventricle: Left ventricular ejection fraction, by estimation, is 50 to 55%. The left ventricle has low normal function. Right Ventricle: The right ventricular size is normal. Right ventricular systolic function is normal. There is normal pulmonary artery systolic pressure. The tricuspid regurgitant velocity is 2.57 m/s, and with an assumed right atrial pressure of 8 mmHg,  the estimated right ventricular systolic pressure is 34.4 mmHg. Pericardium: A small pericardial effusion is present. The pericardial effusion is posterior to the left ventricle. There is no evidence of cardiac tamponade. Mitral Valve: The mitral valve is grossly normal. Mild to moderate mitral valve regurgitation. Tricuspid Valve: The tricuspid valve is grossly normal. Tricuspid valve regurgitation is moderate. Aortic Valve: The aortic  valve is normal in structure. Aortic valve regurgitation is not visualized. Pulmonic Valve: Pulmonic valve regurgitation is trivial. Venous: The inferior vena cava is normal in size with less than 50% respiratory variability, suggesting right atrial pressure of 8 mmHg. LEFT VENTRICLE PLAX 2D LVOT diam:     1.60 cm LVOT Area:     2.01 cm  IVC IVC diam: 1.70 cm  AORTA Ao Asc diam: 2.60 cm TRICUSPID VALVE TR Peak grad:   26.4 mmHg TR Vmax:        257.00 cm/s  SHUNTS Systemic Diam: 1.60 cm Weston Brass MD Electronically signed by Weston Brass MD Signature Date/Time: 12/28/2020/10:16:52 AM    Final         Scheduled Meds:  bacitracin   Topical BID   Chlorhexidine Gluconate Cloth  6 each Topical Daily   doxylamine (Sleep)  12.5 mg Oral QHS   enoxaparin  70 mg Subcutaneous Q12H   feeding supplement (PROSource TF)  45 mL Per Tube BID   ferrous sulfate  300 mg Per Tube QODAY   folic acid  1 mg Intravenous  Daily   hydrALAZINE  25 mg Per Tube Q8H   mouth rinse  15 mL Mouth Rinse q12n4p   metoCLOPramide (REGLAN) injection  5 mg Intravenous Q6H   pantoprazole (PROTONIX) IV  40 mg Intravenous Q12H   [START ON 12/29/2020] prenatal vitamin w/FE, FA  1 tablet Oral Q1200   scopolamine  1 patch Transdermal Q72H   sodium chloride flush  10-40 mL Intracatheter Q12H   sodium chloride flush  3 mL Intravenous Q12H   vitamin B-6  12.5 mg Oral QHS   Continuous Infusions:  sodium chloride Stopped (12/19/20 1700)   feeding supplement (OSMOLITE 1.5 CAL) 1,000 mL (12/28/20 1451)   insulin 1 Units/hr (12/28/20 1558)   lactated ringers 10 mL/hr at 12/25/20 1957   promethazine (PHENERGAN)25mg  in 50mL NS 25 mg (12/28/20 0006)   thiamine injection 500 mg (12/28/20 1417)     LOS: 12 days   Time spent: 25 mins Greater than 50% of this time was spent in counseling, explanation of diagnosis, planning of further management, and coordination of care.   Voice Recognition Reubin Milan/Dragon dictation system was used to create  this note, attempts have been made to correct errors. Please contact the author with questions and/or clarifications.   Albertine GratesFang Ahaan Zobrist, MD PhD FACP [facility]  Available via Epic secure chat 7am-7pm for nonurgent issues Please page for urgent issues To page the attending provider between 7A-7P or the covering provider during after hours 7P-7A, please log into the web site [email or web address] and access using universal  password for that web site. If you do not have the password, please call the hospital operator.    12/28/2020, 4:21 PM

## 2020-12-28 NOTE — Progress Notes (Signed)
Patient ID: Ashley Camacho, female   DOB: 04-15-1991, 30 y.o.   MRN: [id number] [facility] ANTEPARTUM COMPREHENSIVE PROGRESS NOTE  Ashley Camacho is a 30 y.o. G2P0101 at [redacted]w[redacted]d  who is admitted to IM service for managements of Type 1 DM complications in setting of second trimester pregnancy.   Fetal presentation is breech. U/S 12/22/20 Length of Stay:  12  Days  Subjective: No obstetric complaints this morning. Yesterday she had an episode of fluid overload, was managed by the [facility] team.  See their note for more details. Patient reports good fetal movement.  She reports no uterine contractions, no bleeding and no loss of fluid per vagina.  Vitals:  Blood pressure 134/63, pulse 98, temperature 98.1 F (36.7 C), temperature source Axillary, resp. rate 20, height 5\' 1"  (1.549 m), weight 69.7 kg, SpO2 93 %. Physical Examination: Gen NAD Lungs normal breath sounds Heart RRR Abd soft, NT, gravid Ext 1-2 + edema  Fetal Monitoring:  Baseline: 140's bpm, Variability: Moderate {6-25 bpm), Accelerations: Reactive, and Decelerations: Absent  Labs:  Results for orders placed or performed during the hospital encounter of 12/16/20 (from the past 24 hour(s))  Glucose, capillary   Collection Time: 12/27/20  8:30 AM  Result Value Ref Range   Glucose-Capillary 115 (H) 70 - 99 mg/dL   Comment 1 Notify RN    Comment 2 Document in Chart   Glucose, capillary   Collection Time: 12/27/20 12:10 PM  Result Value Ref Range   Glucose-Capillary 100 (H) 70 - 99 mg/dL   Comment 1 Notify RN    Comment 2 Document in Chart   Brain natriuretic peptide   Collection Time: 12/27/20  1:22 PM  Result Value Ref Range   B Natriuretic Peptide 929.6 (H) 0.0 - 100.0 pg/mL  Troponin I (High Sensitivity)   Collection Time: 12/27/20  1:22 PM  Result Value Ref Range   Troponin I (High Sensitivity) 8 <18 ng/L  Troponin I (High Sensitivity)   Collection Time: 12/27/20  3:50 PM  Result Value Ref Range   Troponin I (High  Sensitivity) 8 <18 ng/L  Glucose, capillary   Collection Time: 12/27/20  4:30 PM  Result Value Ref Range   Glucose-Capillary 121 (H) 70 - 99 mg/dL   Comment 1 Notify RN    Comment 2 Document in Chart   Glucose, capillary   Collection Time: 12/27/20  8:33 PM  Result Value Ref Range   Glucose-Capillary 91 70 - 99 mg/dL  Glucose, capillary   Collection Time: 12/28/20  1:39 AM  Result Value Ref Range   Glucose-Capillary 108 (H) 70 - 99 mg/dL  Glucose, capillary   Collection Time: 12/28/20  6:36 AM  Result Value Ref Range   Glucose-Capillary 116 (H) 70 - 99 mg/dL  CBC   Collection Time: 12/28/20  6:58 AM  Result Value Ref Range   WBC 6.7 4.0 - 10.5 K/uL   RBC 2.78 (L) 3.87 - 5.11 MIL/uL   Hemoglobin 8.3 (L) 12.0 - 15.0 g/dL   HCT 02/27/21 (L) 40.3 - 47.4 %   MCV 91.7 80.0 - 100.0 fL   MCH 29.9 26.0 - 34.0 pg   MCHC 32.5 30.0 - 36.0 g/dL   RDW 25.9 56.3 - 87.5 %   Platelets 195 150 - 400 K/uL   nRBC 0.0 0.0 - 0.2 %    Imaging Studies:    NA   Medications:  Scheduled  bacitracin   Topical BID   Chlorhexidine Gluconate Cloth  6 each Topical Daily   doxylamine (Sleep)  12.5 mg Oral QHS   enoxaparin  70 mg Subcutaneous Q12H   feeding supplement (PROSource TF)  45 mL Per Tube BID   ferrous sulfate  300 mg Per Tube QODAY   folic acid  1 mg Intravenous Daily   hydrALAZINE  25 mg Per Tube Q8H   mouth rinse  15 mL Mouth Rinse q12n4p   metoCLOPramide (REGLAN) injection  5 mg Intravenous Q6H   pantoprazole (PROTONIX) IV  40 mg Intravenous Q12H   prenatal multivitamin  1 tablet Per Tube Q1200   scopolamine  1 patch Transdermal Q72H   sodium chloride flush  10-40 mL Intracatheter Q12H   sodium chloride flush  3 mL Intravenous Q12H   vitamin B-6  12.5 mg Oral QHS   I have reviewed the patient's current medications.  ASSESSMENT & Plan  IUP [redacted]w[redacted]d Multiple medical complications related to Type 1 DM  Stable for OB stand point. MFM consult on chart. They will see again next week and  make recommendations on timing of delivery. But do not expect prior to 28 weeks, goal would be 32-34 weeks if medically possible. Fetal well being reassuring at present. Appreciate IM and Cardiology care for pt.   Jaynie Collins, MD 12/28/2020,7:34 AM

## 2020-12-28 NOTE — Progress Notes (Signed)
  Echocardiogram 2D Echocardiogram has been performed.  Ashley Camacho 12/28/2020, 9:55 AM

## 2020-12-28 NOTE — Progress Notes (Signed)
Progress Note  Patient Name: Ashley Camacho Date of Encounter: 12/28/2020  [facility] Cardiologist: None   Subjective   Feeling better today  Inpatient Medications    Scheduled Meds:  bacitracin   Topical BID   Chlorhexidine Gluconate Cloth  6 each Topical Daily   doxylamine (Sleep)  12.5 mg Oral QHS   enoxaparin  70 mg Subcutaneous Q12H   feeding supplement (PROSource TF)  45 mL Per Tube BID   ferrous sulfate  300 mg Per Tube QODAY   folic acid  1 mg Intravenous Daily   hydrALAZINE  25 mg Per Tube Q8H   mouth rinse  15 mL Mouth Rinse q12n4p   metoCLOPramide (REGLAN) injection  5 mg Intravenous Q6H   pantoprazole (PROTONIX) IV  40 mg Intravenous Q12H   prenatal multivitamin  1 tablet Per Tube Q1200   scopolamine  1 patch Transdermal Q72H   sodium chloride flush  10-40 mL Intracatheter Q12H   sodium chloride flush  3 mL Intravenous Q12H   vitamin B-6  12.5 mg Oral QHS   Continuous Infusions:  sodium chloride Stopped (12/19/20 1700)   albumin human 12.5 g (12/28/20 0643)   feeding supplement (OSMOLITE 1.5 CAL) 1,000 mL (12/27/20 2018)   insulin 1.3 Units/hr (12/28/20 0831)   lactated ringers 10 mL/hr at 12/25/20 1957   promethazine (PHENERGAN)25mg  in 65mL NS 25 mg (12/28/20 0006)   thiamine injection 500 mg (12/27/20 1452)   PRN Meds: sodium chloride, acetaminophen **OR** acetaminophen, dextrose, guaiFENesin-dextromethorphan, loperamide, ondansetron, phenol, prochlorperazine, promethazine (PHENERGAN)25mg  in 72mL NS, sodium chloride flush, sodium chloride flush   Vital Signs    Vitals:   12/28/20 0500 12/28/20 0644 12/28/20 0645 12/28/20 0808  BP:  (!) 142/58  (!) 122/49  Pulse:  96  97  Resp:  16  19  Temp:  98 F (36.7 C)  98.2 F (36.8 C)  TempSrc:  Axillary  Oral  SpO2:   94% 94%  Weight: 69.7 kg     Height:        Intake/Output Summary (Last 24 hours) at 12/28/2020 1032 Last data filed at 12/28/2020 1003 Gross per 24 hour  Intake 2788.73 ml  Output  2330 ml  Net 458.73 ml   Last 3 Weights 12/28/2020 12/27/2020 12/26/2020  Weight (lbs) 153 lb 11.2 oz 155 lb 14.4 oz 153 lb 8 oz  Weight (kg) 69.718 kg 70.716 kg 69.627 kg      Telemetry    SR- Personally Reviewed  ECG    ST, low voltage, no ST changes.- Personally Reviewed  Physical Exam   VS:  BP (!) 122/49 (BP Location: Right Leg)   Pulse 97   Temp 98.2 F (36.8 C) (Oral)   Resp 19   Ht 5\' 1"  (1.549 m)   Wt 69.7 kg   LMP  (LMP Unknown)   SpO2 94%   BMI 29.04 kg/m  , BMI Body mass index is 29.04 kg/m.  GEN: No acute distress.   Neck: JVD to lower 1/3 of neck at 60 deg Cardiac: RRR, no murmurs, rubs, or gallops.  Respiratory: Clear to auscultation bilaterally. GI: Soft, nontender, non-distended, gravid abd. MS: 1+ puffy edema; No deformity. Neuro:  Nonfocal  Psych: flat affect   Labs    High Sensitivity Troponin:   Recent Labs  Lab 12/27/20 1322 12/27/20 1550  TROPONINIHS 8 8      Chemistry Recent Labs  Lab 12/22/20 0625 12/23/20 0527 12/26/20 0330 12/27/20 0500 12/28/20 0658  NA  139   < > 141 141 142  K 3.8   < > 4.4 4.4 4.5  CL 103   < > 106 108 108  CO2 29   < > 29 27 26   GLUCOSE 108*   < > 120* 91 113*  BUN 25*   < > 27* 27* 30*  CREATININE 1.16*   < > 1.31* 1.36* 1.34*  CALCIUM 7.3*   < > 8.4* 8.6* 8.7*  PROT 4.8*  --   --   --   --   ALBUMIN 2.5*  --   --   --   --   AST 26  --   --   --   --   ALT 17  --   --   --   --   ALKPHOS 56  --   --   --   --   BILITOT 0.7  --   --   --   --   GFRNONAA >60   < > 57* 54* 55*  ANIONGAP 7   < > 6 6 8    < > = values in this interval not displayed.     Hematology Recent Labs  Lab 12/26/20 0330 12/27/20 0500 12/28/20 0658  WBC 7.6 8.6 6.7  RBC 2.51* 2.86* 2.78*  HGB 7.2* 8.3* 8.3*  HCT 23.2* 26.6* 25.5*  MCV 92.4 93.0 91.7  MCH 28.7 29.0 29.9  MCHC 31.0 31.2 32.5  RDW 15.4 15.6* 15.5  PLT 235 211 195    BNP Recent Labs  Lab 12/23/20 0833 12/27/20 1322  BNP 480.0* 929.6*      DDimer No results for input(s): DDIMER in the last 168 hours.   Radiology    02/22/21 Abdomen Limited  Result Date: 12/26/2020 CLINICAL DATA:  Palpable mass of left lower abdomen, pain EXAM: ULTRASOUND ABDOMEN LIMITED COMPARISON:  None. FINDINGS: Targeted ultrasound examination of the patient identified area of pain and palpable abnormality in the left lower quadrant reveals a multilobulated cystic lesion measuring 1.2 x 1.0 x 1.2 cm in the subcutaneous fat. IMPRESSION: Multiloculated cystic lesion in the subcutaneous fat of the left lower abdomen. This may reflect a small abscess if clinically referable signs and symptoms are patent. Electronically Signed   By: [place] M.D.   On: 12/26/2020 15:02   DG Chest Port 1 View  Result Date: 12/27/2020 CLINICAL DATA:  Respiratory distress. EXAM: PORTABLE CHEST 1 VIEW COMPARISON:  12/23/2020 FINDINGS: Cardiomegaly with pulmonary vascular congestion and slightly increased interstitial opacities/edema noted. Slightly increasing pleural effusions noted, small to moderate on the RIGHT and trace to small on the LEFT. Bibasilar opacities are increased, favor atelectasis/edema. A LEFT PICC line with tip overlying SUPERIOR cavoatrial junction and small bore feeding tube entering the stomach with tip off the field of view noted. There is no evidence of pneumothorax. IMPRESSION: 1. Cardiomegaly with pulmonary vascular congestion and slightly increased interstitial opacities/edema. 2. Slightly increasing pleural effusions, small to moderate on the RIGHT and trace to small on the LEFT. 3. Increased bibasilar opacities, favor atelectasis or edema. Electronically Signed   By: 02/26/2021 M.D.   On: 12/27/2020 13:41   ECHOCARDIOGRAM LIMITED  Result Date: 12/28/2020    ECHOCARDIOGRAM LIMITED REPORT   Patient Name:   Ashley Camacho Date of Exam: 12/28/2020 Medical Rec #:  [name]       Height:       61.0 in Accession #:    02/27/2021      Weight:  153.7 lb Date of Birth:   02-12-1991      BSA:          1.689 m Patient Age:    29 years        BP:           122/49 mmHg Patient Gender: F               HR:           97 bpm. Exam Location:  Inpatient Procedure: Cardiac Doppler, Color Doppler and Limited Echo Indications:    Pericardial effusion                 CHF-Acute systolic  History:        Patient has prior history of Echocardiogram examinations, most                 recent 12/17/2020. Risk Factors:Hypertension and Diabetes.  Sonographer:    Ross Ludwig RDCS (AE) Referring Phys: [id number] Parke Poisson  Sonographer Comments: [redacted] weeks pregnant at time of study IMPRESSIONS  1. A small pericardial effusion is present. The pericardial effusion is posterior to the left ventricle. There is no evidence of cardiac tamponade.  2. Left ventricular ejection fraction, by estimation, is 50 to 55%. The left ventricle has low normal function.  3. Right ventricular systolic function is normal. The right ventricular size is normal. There is normal pulmonary artery systolic pressure. The estimated right ventricular systolic pressure is 34.4 mmHg.  4. The mitral valve is grossly normal. Mild to moderate mitral valve regurgitation.  5. Tricuspid valve regurgitation is moderate.  6. The aortic valve is normal in structure. Aortic valve regurgitation is not visualized.  7. The inferior vena cava is normal in size with <50% respiratory variability, suggesting right atrial pressure of 8 mmHg. Comparison(s): A prior study was performed on 12/17/20. Prior images reviewed side by side. LVEF has improved slightly. No change in pericardial effusion. FINDINGS  Left Ventricle: Left ventricular ejection fraction, by estimation, is 50 to 55%. The left ventricle has low normal function. Right Ventricle: The right ventricular size is normal. Right ventricular systolic function is normal. There is normal pulmonary artery systolic pressure. The tricuspid regurgitant velocity is 2.57 m/s, and with an assumed right atrial  pressure of 8 mmHg,  the estimated right ventricular systolic pressure is 34.4 mmHg. Pericardium: A small pericardial effusion is present. The pericardial effusion is posterior to the left ventricle. There is no evidence of cardiac tamponade. Mitral Valve: The mitral valve is grossly normal. Mild to moderate mitral valve regurgitation. Tricuspid Valve: The tricuspid valve is grossly normal. Tricuspid valve regurgitation is moderate. Aortic Valve: The aortic valve is normal in structure. Aortic valve regurgitation is not visualized. Pulmonic Valve: Pulmonic valve regurgitation is trivial. Venous: The inferior vena cava is normal in size with less than 50% respiratory variability, suggesting right atrial pressure of 8 mmHg. LEFT VENTRICLE PLAX 2D LVOT diam:     1.60 cm LVOT Area:     2.01 cm  IVC IVC diam: 1.70 cm  AORTA Ao Asc diam: 2.60 cm TRICUSPID VALVE TR Peak grad:   26.4 mmHg TR Vmax:        257.00 cm/s  SHUNTS Systemic Diam: 1.60 cm Weston Brass MD Electronically signed by Weston Brass MD Signature Date/Time: 12/28/2020/10:16:52 AM    Final     Cardiac Studies   ECHO 12/28/20:   1. A small pericardial effusion is present. The pericardial effusion is  posterior to the left ventricle. There is no evidence of cardiac  tamponade.   2. Left ventricular ejection fraction, by estimation, is 50 to 55%. The  left ventricle has low normal function.   3. Right ventricular systolic function is normal. The right ventricular  size is normal. There is normal pulmonary artery systolic pressure. The  estimated right ventricular systolic pressure is 34.4 mmHg.   4. The mitral valve is grossly normal. Mild to moderate mitral valve  regurgitation.   5. Tricuspid valve regurgitation is moderate.   6. The aortic valve is normal in structure. Aortic valve regurgitation is  not visualized.   7. The inferior vena cava is normal in size with <50% respiratory  variability, suggesting right atrial pressure of 8  mmHg.   Comparison(s): A prior study was performed on 12/17/20. Prior images  reviewed side by side. LVEF has improved slightly. No change in  pericardial effusion.   Echo 12/17/20: 1. Left ventricular ejection fraction, by estimation, is 40 to 45%. The  left ventricle has mildly decreased function. The left ventricle  demonstrates global hypokinesis. Left ventricular diastolic parameters are  indeterminate.   2. Right ventricular systolic function is normal. The right ventricular  size is mildly enlarged. There is normal pulmonary artery systolic  pressure. The estimated right ventricular systolic pressure is 29.2 mmHg.   3. Right atrial size was mildly dilated.   4. A small pericardial effusion is present.   5. The mitral valve is normal in structure. Mild to moderate mitral valve  regurgitation.   6. The aortic valve is tricuspid. Aortic valve regurgitation is not  visualized. No aortic stenosis is present.   7. The inferior vena cava is normal in size with greater than 50%  respiratory variability, suggesting right atrial pressure of 3 mmHg.   Patient Profile     30 y.o. female [redacted] weeks pregnant with type 1 diabetes, anemia, and acute systolic and diastolic heart failure.  Previously treated at [facility] for sepsis and PNA.  Assessment & Plan    #Acute systolic and also heart failure: # AKI: #DM1: LVEF 40 to 45% on admission.  Etiology unclear.  Occurred too early for peripartum cardiomyopathy.  Given her hyperemesis gravidarum it is thought that nutritional deficiency is most likely diagnosis. - LVEF improved to 50-55% on echo 8/7. -stable pericardial effusion, no tamponade. - symptoms improved from yesterday. Will give one additional dose of lasix 40 mg IV, she is +184 mL today, will aim for net even. Renal function stable but still abnormal. 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 Receiving daily albumin.  # Hypertension:  BP  stable on hydralazine. Monitor for hypotension.   # UE DVT: On Lovenox. Not strongly suspicious for PE with respiratory decompensation 12/27/20.   For questions or updates, please contact [facility] Please consult [email or web address] for contact info under        Signed, Parke Poisson, MD  12/28/2020, 10:32 AM

## 2020-12-29 ENCOUNTER — Inpatient Hospital Stay (HOSPITAL_BASED_OUTPATIENT_CLINIC_OR_DEPARTMENT_OTHER): Payer: Medicaid Other

## 2020-12-29 DIAGNOSIS — O24012 Pre-existing diabetes mellitus, type 1, in pregnancy, second trimester: Secondary | ICD-10-CM

## 2020-12-29 DIAGNOSIS — O99012 Anemia complicating pregnancy, second trimester: Secondary | ICD-10-CM | POA: Diagnosis not present

## 2020-12-29 DIAGNOSIS — E109 Type 1 diabetes mellitus without complications: Secondary | ICD-10-CM

## 2020-12-29 DIAGNOSIS — D649 Anemia, unspecified: Secondary | ICD-10-CM | POA: Diagnosis not present

## 2020-12-29 DIAGNOSIS — O289 Unspecified abnormal findings on antenatal screening of mother: Secondary | ICD-10-CM

## 2020-12-29 DIAGNOSIS — O402XX0 Polyhydramnios, second trimester, not applicable or unspecified: Secondary | ICD-10-CM

## 2020-12-29 DIAGNOSIS — Z3A27 27 weeks gestation of pregnancy: Secondary | ICD-10-CM | POA: Diagnosis not present

## 2020-12-29 DIAGNOSIS — O210 Mild hyperemesis gravidarum: Secondary | ICD-10-CM

## 2020-12-29 LAB — BASIC METABOLIC PANEL
Anion gap: 8 (ref 5–15)
BUN: 33 mg/dL — ABNORMAL HIGH (ref 6–20)
CO2: 26 mmol/L (ref 22–32)
Calcium: 8.5 mg/dL — ABNORMAL LOW (ref 8.9–10.3)
Chloride: 107 mmol/L (ref 98–111)
Creatinine, Ser: 1.41 mg/dL — ABNORMAL HIGH (ref 0.44–1.00)
GFR, Estimated: 52 mL/min — ABNORMAL LOW (ref >60)
Glucose, Bld: 118 mg/dL — ABNORMAL HIGH (ref 70–99)
Potassium: 4.3 mmol/L (ref 3.5–5.1)
Sodium: 141 mmol/L (ref 135–145)

## 2020-12-29 LAB — GLUCOSE, CAPILLARY
Glucose-Capillary: 100 mg/dL — ABNORMAL HIGH (ref 70–99)
Glucose-Capillary: 119 mg/dL — ABNORMAL HIGH (ref 70–99)
Glucose-Capillary: 120 mg/dL — ABNORMAL HIGH (ref 70–99)
Glucose-Capillary: 85 mg/dL (ref 70–99)
Glucose-Capillary: 95 mg/dL (ref 70–99)
Glucose-Capillary: 96 mg/dL (ref 70–99)

## 2020-12-29 LAB — TYPE AND SCREEN
ABO/RH(D): A POS
Antibody Screen: NEGATIVE

## 2020-12-29 LAB — MAGNESIUM: Magnesium: 2.4 mg/dL (ref 1.7–2.4)

## 2020-12-29 LAB — HEPARIN ANTI-XA: Heparin LMW: 0.81 IU/mL

## 2020-12-29 NOTE — Care Management (Signed)
CM will continue to follow for needs and discharge follow up.   CM spoke to RN for updates and updated Pam with [facility].   Gretchen Short RNC-MNN, BSN Transitions of Care [facility]

## 2020-12-29 NOTE — Progress Notes (Signed)
PROGRESS NOTE    Ashley Camacho  HMC:[id number] DOB: 05-May-1991 DOA: 12/16/2020 PCP: Hildred Laser, MD   Chief Complain:Nausea,vomiting  Brief Narrative:  Patient is a 30 year old female G2,P1 at [redacted] weeks gestation who has a history of type 1 diabetes mellitus.  She presented to the emergency room for evaluation of persistent nausea and vomiting .  She was recently with multiple hospitalization, at [facility] for sepsis secondary to pneumonia and was there for 2 weeks.  Couple hospitalization at [facility] due to intractable nausea and vomiting. -Transferred from [facility] to [facility] 7/26 given her severe malnutrition, with hyperemesis gravidarum, for consideration of cortrak tube insertion.  Patient had core track inserted on admission, so far tolerating tube feed (she remains with significant nausea and vomiting, but this is at baseline and did not worsen with initiation of tube feed), as well her work-up was significant for acute CHF, requiring diuresis, cardiology following. Started  on clear liquid diet today.  Assessment & Plan:   Active Problems:   Anemia affecting pregnancy in second trimester   AKI (acute kidney injury) (HCC)   Hyperemesis   Pregnancy with 24 completed weeks gestation   DKA, type 1, not at goal [facility])   Starvation ketoacidosis   Acute deep vein thrombosis (DVT) of brachial vein of right upper extremity (HCC)   Acute respiratory failure with hypoxia (HCC)   Vitreous hemorrhage of right eye (HCC)   Gastroparesis due to DM (HCC)   Diabetic retinopathy (HCC)   Acute on chronic combined systolic and diastolic CHF (congestive heart failure) (HCC)   Type 1 diabetes mellitus affecting pregnancy in second trimester, antepartum   HFrEF (heart failure with reduced ejection fraction) (HCC)   Intractable nausea/vomiting, severe protein calorie malnutrition, hyperemesis gravidarum/underlying gastroparesis: Initially presented with persistent nausea and vomiting.   Was on TPN previously which was discontinued on 7/28.  Now being fed through tube.  Tolerating tube feeding.  Still has some nausea but not vomited this morning.  Was previously started on a steroid for hyperemesis gravidarum but it was discontinued because it did not help.  Currently on Reglan, Zofran, scopolamine, Protonix, Compazine, Phenergan. Will try clear liquid diet today.  We have also consulted speech therapy. Goal is to remove the tube feeding at some point.  Type 1 diabetes: Uncontrolled with hyperglycemia alternating with hyperglycemia.  On insulin drip.  Follows with endocrinologist and takes insulin at home.  Cardiomyopathy/acute systolic congestive heart failure: Currently on hydralazine, as needed Lasix.  Significantly volume overloaded on examination.  Has severe bilateral lower extremity edema.  Echo done on 12/28/2020 showed ejection fraction of 50 to 55%, small pericardial effusion.  Echo done on 7/22+ ejection fraction of 40 to 45%.  Cardiology following.  Lasix on hold due to AKI.  Right upper extremity DVT: On Lovenox  AKI on CKD stage II: Hospital course remarkable for AKI, diuresis on hold.  Continue to monitor.  Second trimester pregnancy: 27 weeks.  GYN following.  Maternal-fetal medicine consulted for timing of delivery.      Nutrition Problem: Inadequate oral intake Etiology: nausea, vomiting      DVT prophylaxis:Lovenox Code Status: Full Family Communication: Boyfriend present at the bedside Status is: Inpatient  Remains inpatient appropriate because:Inpatient level of care appropriate due to severity of illness  Dispo: The patient is from: Home              Anticipated d/c is to: Home  Patient currently is not medically stable to d/c.   Difficult to place patient No     Consultants: Cardiology, GYN  Procedures: None  Antimicrobials:  Anti-infectives (From admission, onward)    None       Subjective: Patient seen and  examined at the bedside this afternoon.  Hemodynamically stable.  Complains of some nausea but has not vomited.  Wants to try some clear liquid diet.  Denies any shortness of breath, cough or abdominal pain.  She has significant bilateral lower extremity edema.  Objective: Vitals:   12/29/20 0616 12/29/20 0621 12/29/20 0743 12/29/20 0818  BP: (!) 131/53 (!) 131/53 (!) 125/50 (!) 125/50  Pulse: 92  94   Resp:    18  Temp:    97.8 F (36.6 C)  TempSrc:    Oral  SpO2:    96%  Weight:      Height:        Intake/Output Summary (Last 24 hours) at 12/29/2020 1431 Last data filed at 12/29/2020 0911 Gross per 24 hour  Intake 1375.84 ml  Output 1750 ml  Net -374.16 ml   Filed Weights   12/27/20 0710 12/28/20 0500 12/29/20 0500  Weight: 70.7 kg 69.7 kg 68.6 kg    Examination:  General exam: Overall comfortable, not in distress HEENT: PERRL Respiratory system:  no wheezes or crackles  Cardiovascular system: S1 & S2 heard, RRR.  Gastrointestinal system: Abdomen is nondistended, soft and nontender. Central nervous system: Alert and oriented Extremities: 3-4+ bilateral lower extremity pitting edema, no clubbing ,no cyanosis Skin: No rashes, no ulcers,no icterus      Data Reviewed: I have personally reviewed following labs and imaging studies  CBC: Recent Labs  Lab 12/24/20 0547 12/25/20 0640 12/26/20 0330 12/27/20 0500 12/28/20 0658  WBC 8.8 8.0 7.6 8.6 6.7  HGB 8.0* 8.1* 7.2* 8.3* 8.3*  HCT 25.4* 25.4* 23.2* 26.6* 25.5*  MCV 91.7 91.4 92.4 93.0 91.7  PLT 239 237 235 211 195   Basic Metabolic Panel: Recent Labs  Lab 12/23/20 0527 12/24/20 0547 12/25/20 0640 12/26/20 0330 12/27/20 0500 12/28/20 0658 12/29/20 0220  NA 139 140 139 141 141 142 141  K 4.4 4.6 4.6 4.4 4.4 4.5 4.3  CL 103 102 106 106 108 108 107  CO2 27 28 26 29 27 26 26   GLUCOSE 98 69* 84 120* 91 113* 118*  BUN 27* 26* 27* 27* 27* 30* 33*  CREATININE 1.22* 1.13* 1.24* 1.31* 1.36* 1.34* 1.41*  CALCIUM  7.7* 8.2* 8.3* 8.4* 8.6* 8.7* 8.5*  MG 2.2 2.4  --   --   --   --  2.4  PHOS 5.1* 5.2*  --   --   --   --   --    GFR: Estimated Creatinine Clearance: 52.1 mL/min (A) (by C-G formula based on SCr of 1.41 mg/dL (H)). Liver Function Tests: No results for input(s): AST, ALT, ALKPHOS, BILITOT, PROT, ALBUMIN in the last 168 hours. No results for input(s): LIPASE, AMYLASE in the last 168 hours. No results for input(s): AMMONIA in the last 168 hours. Coagulation Profile: No results for input(s): INR, PROTIME in the last 168 hours. Cardiac Enzymes: No results for input(s): CKTOTAL, CKMB, CKMBINDEX, TROPONINI in the last 168 hours. BNP (last 3 results) No results for input(s): PROBNP in the last 8760 hours. HbA1C: No results for input(s): HGBA1C in the last 72 hours. CBG: Recent Labs  Lab 12/28/20 1802 12/29/20 0014 12/29/20 0410 12/29/20 0914 12/29/20 1223  GLUCAP  132* 96 119* 100* 120*   Lipid Profile: No results for input(s): CHOL, HDL, LDLCALC, TRIG, CHOLHDL, LDLDIRECT in the last 72 hours. Thyroid Function Tests: No results for input(s): TSH, T4TOTAL, FREET4, T3FREE, THYROIDAB in the last 72 hours. Anemia Panel: No results for input(s): VITAMINB12, FOLATE, FERRITIN, TIBC, IRON, RETICCTPCT in the last 72 hours. Sepsis Labs: No results for input(s): PROCALCITON, LATICACIDVEN in the last 168 hours.  Recent Results (from the past 240 hour(s))  OB Urine Culture     Status: Abnormal   Collection Time: 12/20/20  6:29 AM   Specimen: OB Catheterized; Urine  Result Value Ref Range Status   Specimen Description OB CATHETERIZED  Final   Special Requests NONE  Final   Culture (A)  Final    <10,000 COLONIES/mL INSIGNIFICANT GROWTH NO GROUP B STREP (S.AGALACTIAE) ISOLATED Performed at [facility], [address] [place], [place] [address]    Report Status 12/21/2020 FINAL  Final  Wet prep, genital     Status: None   Collection Time: 12/20/20  5:41 PM   Specimen: Vaginal   Result Value Ref Range Status   Yeast Wet Prep HPF POC NONE SEEN NONE SEEN Final   Trich, Wet Prep NONE SEEN NONE SEEN Final   Clue Cells Wet Prep HPF POC NONE SEEN NONE SEEN Final   WBC, Wet Prep HPF POC NONE SEEN NONE SEEN Final   Sperm NONE SEEN  Final    Comment: Performed at [facility], [address] [place], [place] [address]         Radiology Studies: ECHOCARDIOGRAM LIMITED  Result Date: 12/28/2020    ECHOCARDIOGRAM LIMITED REPORT   Patient Name:   Ashley Camacho Date of Exam: 12/28/2020 Medical Rec #:  [id number]       Height:       61.0 in Accession #:    [id number]      Weight:       153.7 lb Date of Birth:  04-Nov-1990      BSA:          1.689 m Patient Age:    29 years        BP:           122/49 mmHg Patient Gender: F               HR:           97 bpm. Exam Location:  Inpatient Procedure: Cardiac Doppler, Color Doppler and Limited Echo Indications:    Pericardial effusion                 CHF-Acute systolic  History:        Patient has prior history of Echocardiogram examinations, most                 recent 12/17/2020. Risk Factors:Hypertension and Diabetes.  Sonographer:    Ross Ludwig RDCS (AE) Referring Phys: [id number] Parke Poisson  Sonographer Comments: [redacted] weeks pregnant at time of study IMPRESSIONS  1. A small pericardial effusion is present. The pericardial effusion is posterior to the left ventricle. There is no evidence of cardiac tamponade.  2. Left ventricular ejection fraction, by estimation, is 50 to 55%. The left ventricle has low normal function.  3. Right ventricular systolic function is normal. The right ventricular size is normal. There is normal pulmonary artery systolic pressure. The estimated right ventricular systolic pressure is 34.4 mmHg.  4. The mitral valve is grossly  normal. Mild to moderate mitral valve regurgitation.  5. Tricuspid valve regurgitation is moderate.  6. The aortic valve is normal in structure. Aortic valve regurgitation is not  visualized.  7. The inferior vena cava is normal in size with <50% respiratory variability, suggesting right atrial pressure of 8 mmHg. Comparison(s): A prior study was performed on 12/17/20. Prior images reviewed side by side. LVEF has improved slightly. No change in pericardial effusion. FINDINGS  Left Ventricle: Left ventricular ejection fraction, by estimation, is 50 to 55%. The left ventricle has low normal function. Right Ventricle: The right ventricular size is normal. Right ventricular systolic function is normal. There is normal pulmonary artery systolic pressure. The tricuspid regurgitant velocity is 2.57 m/s, and with an assumed right atrial pressure of 8 mmHg,  the estimated right ventricular systolic pressure is 34.4 mmHg. Pericardium: A small pericardial effusion is present. The pericardial effusion is posterior to the left ventricle. There is no evidence of cardiac tamponade. Mitral Valve: The mitral valve is grossly normal. Mild to moderate mitral valve regurgitation. Tricuspid Valve: The tricuspid valve is grossly normal. Tricuspid valve regurgitation is moderate. Aortic Valve: The aortic valve is normal in structure. Aortic valve regurgitation is not visualized. Pulmonic Valve: Pulmonic valve regurgitation is trivial. Venous: The inferior vena cava is normal in size with less than 50% respiratory variability, suggesting right atrial pressure of 8 mmHg. LEFT VENTRICLE PLAX 2D LVOT diam:     1.60 cm LVOT Area:     2.01 cm  IVC IVC diam: 1.70 cm  AORTA Ao Asc diam: 2.60 cm TRICUSPID VALVE TR Peak grad:   26.4 mmHg TR Vmax:        257.00 cm/s  SHUNTS Systemic Diam: 1.60 cm Weston Brass MD Electronically signed by Weston Brass MD Signature Date/Time: 12/28/2020/10:16:52 AM    Final         Scheduled Meds:  bacitracin   Topical BID   Chlorhexidine Gluconate Cloth  6 each Topical Daily   doxylamine (Sleep)  12.5 mg Oral QHS   enoxaparin  70 mg Subcutaneous Q12H   feeding supplement  (PROSource TF)  45 mL Per Tube BID   ferrous sulfate  300 mg Per Tube QODAY   folic acid  1 mg Intravenous Daily   hydrALAZINE  25 mg Per Tube Q8H   mouth rinse  15 mL Mouth Rinse q12n4p   metoCLOPramide (REGLAN) injection  5 mg Intravenous Q6H   pantoprazole (PROTONIX) IV  40 mg Intravenous Q12H   prenatal vitamin w/FE, FA  1 tablet Oral Q1200   scopolamine  1 patch Transdermal Q72H   sodium chloride flush  10-40 mL Intracatheter Q12H   sodium chloride flush  3 mL Intravenous Q12H   vitamin B-6  12.5 mg Oral QHS   Continuous Infusions:  sodium chloride Stopped (12/19/20 1700)   feeding supplement (OSMOLITE 1.5 CAL) 1,000 mL (12/29/20 1040)   insulin 1.7 Units/hr (12/29/20 1406)   lactated ringers 10 mL/hr at 12/25/20 1957   promethazine (PHENERGAN)25mg  in 52mL NS 25 mg (12/28/20 0006)   thiamine injection 500 mg (12/29/20 1416)     LOS: 13 days    Time spent:35 mins. More than 50% of that time was spent in counseling and/or coordination of care.      Burnadette Pop, MD [facility] P8/12/2020, 2:31 PM

## 2020-12-29 NOTE — Progress Notes (Signed)
Follow up with Kambri.  Chaplain reviewed AD paperwork again and pt stated she desired to complete documents. Chaplain went to retrieve a pen to help pt complete documents and returned to find she had visitors in her room. Pt stated she would like chaplain to return at a later time if possible. Notary is available between 1:30-3:00 so chaplain will have notary follow up tomorrow.  Please page as further needs arise.  Maryanna Shape. Carley Hammed, M.Div. [facility] Chaplain Pager [phone] Office [phone]

## 2020-12-29 NOTE — Progress Notes (Signed)
Progress Note  Patient Name: Ashley Camacho Date of Encounter: 12/29/2020  [facility] Cardiologist: None   Subjective   Overall better. Denies dyspnea. Less nausea.  Inpatient Medications    Scheduled Meds:  bacitracin   Topical BID   Chlorhexidine Gluconate Cloth  6 each Topical Daily   doxylamine (Sleep)  12.5 mg Oral QHS   enoxaparin  70 mg Subcutaneous Q12H   feeding supplement (PROSource TF)  45 mL Per Tube BID   ferrous sulfate  300 mg Per Tube QODAY   folic acid  1 mg Intravenous Daily   hydrALAZINE  25 mg Per Tube Q8H   mouth rinse  15 mL Mouth Rinse q12n4p   metoCLOPramide (REGLAN) injection  5 mg Intravenous Q6H   pantoprazole (PROTONIX) IV  40 mg Intravenous Q12H   prenatal vitamin w/FE, FA  1 tablet Oral Q1200   scopolamine  1 patch Transdermal Q72H   sodium chloride flush  10-40 mL Intracatheter Q12H   sodium chloride flush  3 mL Intravenous Q12H   vitamin B-6  12.5 mg Oral QHS   Continuous Infusions:  sodium chloride Stopped (12/19/20 1700)   feeding supplement (OSMOLITE 1.5 CAL) 1,000 mL (12/28/20 1451)   insulin 1.2 Units/hr (12/29/20 0836)   lactated ringers 10 mL/hr at 12/25/20 1957   promethazine (PHENERGAN)25mg  in 16mL NS 25 mg (12/28/20 0006)   thiamine injection 500 mg (12/28/20 1417)   PRN Meds: sodium chloride, acetaminophen **OR** acetaminophen, dextrose, guaiFENesin-dextromethorphan, loperamide, ondansetron, phenol, prochlorperazine, promethazine (PHENERGAN)25mg  in 24mL NS, sodium chloride flush, sodium chloride flush   Vital Signs    Vitals:   12/29/20 0616 12/29/20 0621 12/29/20 0743 12/29/20 0818  BP: (!) 131/53 (!) 131/53 (!) 125/50 (!) 125/50  Pulse: 92  94   Resp:    18  Temp:    97.8 F (36.6 C)  TempSrc:    Oral  SpO2:    96%  Weight:      Height:        Intake/Output Summary (Last 24 hours) at 12/29/2020 0933 Last data filed at 12/29/2020 0911 Gross per 24 hour  Intake 1922.24 ml  Output 2360 ml  Net -437.76 ml    Last 3 Weights 12/29/2020 12/28/2020 12/27/2020  Weight (lbs) 151 lb 4.8 oz 153 lb 11.2 oz 155 lb 14.4 oz  Weight (kg) 68.629 kg 69.718 kg 70.716 kg      Telemetry    NSr - Personally Reviewed  ECG    No new tracing - Personally Reviewed  Physical Exam  Pale GEN: No acute distress.   Neck: No JVD Cardiac: RRR, no murmurs, rubs, or gallops.  Respiratory: Clear to auscultation bilaterally. GI: Soft, nontender, non-distended  MS: 1-2+ pedal edema; No deformity. Neuro:  Nonfocal  Psych: Normal affect   Labs    High Sensitivity Troponin:   Recent Labs  Lab 12/27/20 1322 12/27/20 1550  TROPONINIHS 8 8      Chemistry Recent Labs  Lab 12/27/20 0500 12/28/20 0658 12/29/20 0220  NA 141 142 141  K 4.4 4.5 4.3  CL 108 108 107  CO2 27 26 26   GLUCOSE 91 113* 118*  BUN 27* 30* 33*  CREATININE 1.36* 1.34* 1.41*  CALCIUM 8.6* 8.7* 8.5*  GFRNONAA 54* 55* 52*  ANIONGAP 6 8 8      Hematology Recent Labs  Lab 12/26/20 0330 12/27/20 0500 12/28/20 0658  WBC 7.6 8.6 6.7  RBC 2.51* 2.86* 2.78*  HGB 7.2* 8.3* 8.3*  HCT 23.2*  26.6* 25.5*  MCV 92.4 93.0 91.7  MCH 28.7 29.0 29.9  MCHC 31.0 31.2 32.5  RDW 15.4 15.6* 15.5  PLT 235 211 195    BNP Recent Labs  Lab 12/23/20 0833 12/27/20 1322  BNP 480.0* 929.6*     DDimer No results for input(s): DDIMER in the last 168 hours.   Radiology    DG Chest Port 1 View  Result Date: 12/27/2020 CLINICAL DATA:  Respiratory distress. EXAM: PORTABLE CHEST 1 VIEW COMPARISON:  12/23/2020 FINDINGS: Cardiomegaly with pulmonary vascular congestion and slightly increased interstitial opacities/edema noted. Slightly increasing pleural effusions noted, small to moderate on the RIGHT and trace to small on the LEFT. Bibasilar opacities are increased, favor atelectasis/edema. A LEFT PICC line with tip overlying SUPERIOR cavoatrial junction and small bore feeding tube entering the stomach with tip off the field of view noted. There is no  evidence of pneumothorax. IMPRESSION: 1. Cardiomegaly with pulmonary vascular congestion and slightly increased interstitial opacities/edema. 2. Slightly increasing pleural effusions, small to moderate on the RIGHT and trace to small on the LEFT. 3. Increased bibasilar opacities, favor atelectasis or edema. Electronically Signed   By: Harmon Pier M.D.   On: 12/27/2020 13:41   ECHOCARDIOGRAM LIMITED  Result Date: 12/28/2020    ECHOCARDIOGRAM LIMITED REPORT   Patient Name:   Ashley Camacho Date of Exam: 12/28/2020 Medical Rec #:  [id number]       Height:       61.0 in Accession #:    [id number]      Weight:       153.7 lb Date of Birth:  04-Apr-1991      BSA:          1.689 m Patient Age:    30 years        BP:           122/49 mmHg Patient Gender: F               HR:           97 bpm. Exam Location:  Inpatient Procedure: Cardiac Doppler, Color Doppler and Limited Echo Indications:    Pericardial effusion                 CHF-Acute systolic  History:        Patient has prior history of Echocardiogram examinations, most                 recent 12/17/2020. Risk Factors:Hypertension and Diabetes.  Sonographer:    Ross Ludwig RDCS (AE) Referring Phys: [id number] Parke Poisson  Sonographer Comments: [redacted] weeks pregnant at time of study IMPRESSIONS  1. A small pericardial effusion is present. The pericardial effusion is posterior to the left ventricle. There is no evidence of cardiac tamponade.  2. Left ventricular ejection fraction, by estimation, is 50 to 55%. The left ventricle has low normal function.  3. Right ventricular systolic function is normal. The right ventricular size is normal. There is normal pulmonary artery systolic pressure. The estimated right ventricular systolic pressure is 34.4 mmHg.  4. The mitral valve is grossly normal. Mild to moderate mitral valve regurgitation.  5. Tricuspid valve regurgitation is moderate.  6. The aortic valve is normal in structure. Aortic valve regurgitation is not visualized.   7. The inferior vena cava is normal in size with <50% respiratory variability, suggesting right atrial pressure of 8 mmHg. Comparison(s): A prior study was performed on 12/17/20. Prior images reviewed side by side. LVEF  has improved slightly. No change in pericardial effusion. FINDINGS  Left Ventricle: Left ventricular ejection fraction, by estimation, is 50 to 55%. The left ventricle has low normal function. Right Ventricle: The right ventricular size is normal. Right ventricular systolic function is normal. There is normal pulmonary artery systolic pressure. The tricuspid regurgitant velocity is 2.57 m/s, and with an assumed right atrial pressure of 8 mmHg,  the estimated right ventricular systolic pressure is 34.4 mmHg. Pericardium: A small pericardial effusion is present. The pericardial effusion is posterior to the left ventricle. There is no evidence of cardiac tamponade. Mitral Valve: The mitral valve is grossly normal. Mild to moderate mitral valve regurgitation. Tricuspid Valve: The tricuspid valve is grossly normal. Tricuspid valve regurgitation is moderate. Aortic Valve: The aortic valve is normal in structure. Aortic valve regurgitation is not visualized. Pulmonic Valve: Pulmonic valve regurgitation is trivial. Venous: The inferior vena cava is normal in size with less than 50% respiratory variability, suggesting right atrial pressure of 8 mmHg. LEFT VENTRICLE PLAX 2D LVOT diam:     1.60 cm LVOT Area:     2.01 cm  IVC IVC diam: 1.70 cm  AORTA Ao Asc diam: 2.60 cm TRICUSPID VALVE TR Peak grad:   26.4 mmHg TR Vmax:        257.00 cm/s  SHUNTS Systemic Diam: 1.60 cm Weston Brass MD Electronically signed by Weston Brass MD Signature Date/Time: 12/28/2020/10:16:52 AM    Final     Cardiac Studies   ECHO 12/26/2020    1. A small pericardial effusion is present. The pericardial effusion is  posterior to the left ventricle. There is no evidence of cardiac  tamponade.   2. Left ventricular ejection  fraction, by estimation, is 50 to 55%. The  left ventricle has low normal function.   3. Right ventricular systolic function is normal. The right ventricular  size is normal. There is normal pulmonary artery systolic pressure. The  estimated right ventricular systolic pressure is 34.4 mmHg.   4. The mitral valve is grossly normal. Mild to moderate mitral valve  regurgitation.   5. Tricuspid valve regurgitation is moderate.   6. The aortic valve is normal in structure. Aortic valve regurgitation is  not visualized.   7. The inferior vena cava is normal in size with <50% respiratory  variability, suggesting right atrial pressure of 8 mmHg.   Comparison(s): A prior study was performed on 12/17/20. Prior images  reviewed side by side. LVEF has improved slightly. No change in  pericardial effusion.   Patient Profile     30 y.o. pregnant female now 47 weeks with type 1 DM, HTN, malnutrition and anemia in setting of intractable nausea/vomiting, presenting with acute decompensation of recent onset chronic combined systolic and diastolic heart failure  Assessment & Plan    Hemodynamically compensated, possibly mildly hypovolemic based on evolution of renal parameters. Improved LVEF. Etiology of CMP remains uncertain (nutritional?). We need to allow for slight +ve fluid balance as she advances into 3rd trimester. No diuretics today. HF therapeutics limited due to pregnancy and inability to take PO meds. Check BNP periodically.     For questions or updates, please contact [facility] Please consult [email or web address] for contact info under        Signed, Thurmon Fair, MD  12/29/2020, 9:33 AM

## 2020-12-29 NOTE — Progress Notes (Signed)
ANTICOAGULATION CONSULT NOTE - Follow Up Consult  Pharmacy Consult for enoxaparin Indication:  VTE treatment for right brachial DVT  No Known Allergies  Patient Measurements: Height: 5\' 1"  (154.9 cm) Weight: 68.6 kg (151 lb 4.8 oz) IBW/kg (Calculated) : 47.8 Heparin Dosing Weight: 70.2 kg  Vital Signs: Temp: 97.8 F (36.6 C) (08/08 0818) Temp Source: Oral (08/08 0818) BP: 125/50 (08/08 0818) Pulse Rate: 94 (08/08 0743)  Labs: Recent Labs    12/27/20 0500 12/27/20 1322 12/27/20 1550 12/28/20 0658 12/29/20 0208 12/29/20 0220  HGB 8.3*  --   --  8.3*  --   --   HCT 26.6*  --   --  25.5*  --   --   PLT 211  --   --  195  --   --   HEPRLOWMOCWT  --   --   --   --  0.81  --   CREATININE 1.36*  --   --  1.34*  --  1.41*  TROPONINIHS  --  8 8  --   --   --     Estimated Creatinine Clearance: 52.1 mL/min (A) (by C-G formula based on SCr of 1.41 mg/dL (H)).   Medications:  Scheduled:   bacitracin   Topical BID   Chlorhexidine Gluconate Cloth  6 each Topical Daily   doxylamine (Sleep)  12.5 mg Oral QHS   enoxaparin  70 mg Subcutaneous Q12H   feeding supplement (PROSource TF)  45 mL Per Tube BID   ferrous sulfate  300 mg Per Tube QODAY   folic acid  1 mg Intravenous Daily   hydrALAZINE  25 mg Per Tube Q8H   mouth rinse  15 mL Mouth Rinse q12n4p   metoCLOPramide (REGLAN) injection  5 mg Intravenous Q6H   pantoprazole (PROTONIX) IV  40 mg Intravenous Q12H   prenatal vitamin w/FE, FA  1 tablet Oral Q1200   scopolamine  1 patch Transdermal Q72H   sodium chloride flush  10-40 mL Intracatheter Q12H   sodium chloride flush  3 mL Intravenous Q12H   vitamin B-6  12.5 mg Oral QHS    Assessment: Anti-Xa level 8/8 @ 02:08 (4.3 hrs post dose) is 0.81 which is within goal range. Scr today is 1.41 mg/dL which is increased from yesterday (1.34 mg/dL). Urine output is decreased as well at 1.4 ml/kg/hr. Her weight is also decreased again at 68.6 kg.   Goal of Therapy:  Anti-Xa level  0.6-1 units/ml 4hrs after LMWH dose given Monitor platelets by anticoagulation protocol: Yes   Plan:  Continue enoxaparin 70mg  q12h. If renal function continues to decline tomorrow morning, we can obtain another anti-Xa level. Otherwise, will plan for a repeat anti-Xa peak level in 1 week as well as monitor renal function/CBC daily.  02/28/21 Ashley Camacho 12/29/2020,9:35 AM

## 2020-12-29 NOTE — Progress Notes (Signed)
Patient ID: Ashley Camacho, female   DOB: 1990/07/14, 30 y.o.   MRN: [id number] [facility] ANTEPARTUM COMPREHENSIVE PROGRESS NOTE  Ashley Camacho is a 30 y.o. G2P0101 at [redacted]w[redacted]d  who is admitted to IM service for management of Type 1 DM complications in setting of second trimester pregnancy.   Fetal presentation is breech. U/S 12/22/20 Length of Stay:  13  Days  Subjective: No obstetric complaints this morning.  Patient reports good fetal movement.  She reports no uterine contractions, no bleeding and no loss of fluid per vagina.  Vitals:  Blood pressure (!) 125/50, pulse 94, temperature 97.8 F (36.6 C), temperature source Oral, resp. rate 18, height 5\' 1"  (1.549 m), weight 68.6 kg, SpO2 96 %. Physical Examination: Gen NAD Lungs normal breath sounds Heart RRR Abd soft, NT, gravid Ext 1-2 + edema  Fetal Monitoring:  Baseline: 140's bpm, Variability: Moderate {6-25 bpm), Accelerations: Reactive, and Decelerations: Absent >> Category 1   Labs:  Results for orders placed or performed during the hospital encounter of 12/16/20 (from the past 24 hour(s))  Glucose, capillary   Collection Time: 12/28/20  2:24 PM  Result Value Ref Range   Glucose-Capillary 62 (L) 70 - 99 mg/dL   Comment 1 Notify RN    Comment 2 Document in Chart   Glucose, capillary   Collection Time: 12/28/20  2:50 PM  Result Value Ref Range   Glucose-Capillary 124 (H) 70 - 99 mg/dL  Glucose, capillary   Collection Time: 12/28/20  6:02 PM  Result Value Ref Range   Glucose-Capillary 132 (H) 70 - 99 mg/dL   Comment 1 Notify RN    Comment 2 Document in Chart   Glucose, capillary   Collection Time: 12/29/20 12:14 AM  Result Value Ref Range   Glucose-Capillary 96 70 - 99 mg/dL  Low molecular wgt heparin (fractionated)   Collection Time: 12/29/20  2:08 AM  Result Value Ref Range   Heparin LMW 0.81 IU/mL  Basic metabolic panel   Collection Time: 12/29/20  2:20 AM  Result Value Ref Range   Sodium 141 135 - 145  mmol/L   Potassium 4.3 3.5 - 5.1 mmol/L   Chloride 107 98 - 111 mmol/L   CO2 26 22 - 32 mmol/L   Glucose, Bld 118 (H) 70 - 99 mg/dL   BUN 33 (H) 6 - 20 mg/dL   Creatinine, Ser 02/28/21 (H) 0.44 - 1.00 mg/dL   Calcium 8.5 (L) 8.9 - 10.3 mg/dL   GFR, Estimated 52 (L) >60 mL/min   Anion gap 8 5 - 15  Magnesium   Collection Time: 12/29/20  2:20 AM  Result Value Ref Range   Magnesium 2.4 1.7 - 2.4 mg/dL  Type and screen [facility]   Collection Time: 12/29/20  4:02 AM  Result Value Ref Range   ABO/RH(D) A POS    Antibody Screen NEG    Sample Expiration      01/01/2021,2359 Performed at [facility], [address], [place], [place]   Glucose, capillary   Collection Time: 12/29/20  4:10 AM  Result Value Ref Range   Glucose-Capillary 119 (H) 70 - 99 mg/dL  Glucose, capillary   Collection Time: 12/29/20  9:14 AM  Result Value Ref Range   Glucose-Capillary 100 (H) 70 - 99 mg/dL  Glucose, capillary   Collection Time: 12/29/20 12:23 PM  Result Value Ref Range   Glucose-Capillary 120 (H) 70 - 99 mg/dL    Imaging  Studies:    NA   Medications:  Scheduled  bacitracin   Topical BID   Chlorhexidine Gluconate Cloth  6 each Topical Daily   doxylamine (Sleep)  12.5 mg Oral QHS   enoxaparin  70 mg Subcutaneous Q12H   feeding supplement (PROSource TF)  45 mL Per Tube BID   ferrous sulfate  300 mg Per Tube QODAY   folic acid  1 mg Intravenous Daily   hydrALAZINE  25 mg Per Tube Q8H   mouth rinse  15 mL Mouth Rinse q12n4p   metoCLOPramide (REGLAN) injection  5 mg Intravenous Q6H   pantoprazole (PROTONIX) IV  40 mg Intravenous Q12H   prenatal vitamin w/FE, FA  1 tablet Oral Q1200   scopolamine  1 patch Transdermal Q72H   sodium chloride flush  10-40 mL Intracatheter Q12H   sodium chloride flush  3 mL Intravenous Q12H   vitamin B-6  12.5 mg Oral QHS   I have reviewed the patient's current medications.  ASSESSMENT & Plan  IUP 105w0d Multiple medical  complications related to Type 1 DM, managed by [facility] team  Stable from OB stand point. MFM consult on chart. They will see again this week and make recommendations on timing of delivery.  Fetal well being reassuring at present. Appreciate IM and Cardiology care for pt.   Jaynie Collins, MD 12/29/2020,12:34 PM

## 2020-12-30 ENCOUNTER — Ambulatory Visit: Payer: Medicaid Other

## 2020-12-30 DIAGNOSIS — O34219 Maternal care for unspecified type scar from previous cesarean delivery: Secondary | ICD-10-CM

## 2020-12-30 DIAGNOSIS — O99012 Anemia complicating pregnancy, second trimester: Secondary | ICD-10-CM

## 2020-12-30 DIAGNOSIS — O09899 Supervision of other high risk pregnancies, unspecified trimester: Secondary | ICD-10-CM

## 2020-12-30 DIAGNOSIS — O24012 Pre-existing diabetes mellitus, type 1, in pregnancy, second trimester: Secondary | ICD-10-CM

## 2020-12-30 DIAGNOSIS — Z3A27 27 weeks gestation of pregnancy: Secondary | ICD-10-CM

## 2020-12-30 LAB — CBC WITH DIFFERENTIAL/PLATELET
Abs Immature Granulocytes: 0.02 10*3/uL (ref 0.00–0.07)
Basophils Absolute: 0.1 10*3/uL (ref 0.0–0.1)
Basophils Relative: 1 %
Eosinophils Absolute: 0.2 10*3/uL (ref 0.0–0.5)
Eosinophils Relative: 3 %
HCT: 24.5 % — ABNORMAL LOW (ref 36.0–46.0)
Hemoglobin: 7.6 g/dL — ABNORMAL LOW (ref 12.0–15.0)
Immature Granulocytes: 0 %
Lymphocytes Relative: 18 %
Lymphs Abs: 1 10*3/uL (ref 0.7–4.0)
MCH: 28.9 pg (ref 26.0–34.0)
MCHC: 31 g/dL (ref 30.0–36.0)
MCV: 93.2 fL (ref 80.0–100.0)
Monocytes Absolute: 0.4 10*3/uL (ref 0.1–1.0)
Monocytes Relative: 8 %
Neutro Abs: 3.7 10*3/uL (ref 1.7–7.7)
Neutrophils Relative %: 70 %
Platelets: 181 10*3/uL (ref 150–400)
RBC: 2.63 MIL/uL — ABNORMAL LOW (ref 3.87–5.11)
RDW: 15.7 % — ABNORMAL HIGH (ref 11.5–15.5)
WBC: 5.3 10*3/uL (ref 4.0–10.5)
nRBC: 0 % (ref 0.0–0.2)

## 2020-12-30 LAB — BASIC METABOLIC PANEL
Anion gap: 6 (ref 5–15)
BUN: 35 mg/dL — ABNORMAL HIGH (ref 6–20)
CO2: 25 mmol/L (ref 22–32)
Calcium: 8.5 mg/dL — ABNORMAL LOW (ref 8.9–10.3)
Chloride: 110 mmol/L (ref 98–111)
Creatinine, Ser: 1.32 mg/dL — ABNORMAL HIGH (ref 0.44–1.00)
GFR, Estimated: 56 mL/min — ABNORMAL LOW (ref >60)
Glucose, Bld: 99 mg/dL (ref 70–99)
Potassium: 4.4 mmol/L (ref 3.5–5.1)
Sodium: 141 mmol/L (ref 135–145)

## 2020-12-30 LAB — GLUCOSE, CAPILLARY
Glucose-Capillary: 102 mg/dL — ABNORMAL HIGH (ref 70–99)
Glucose-Capillary: 117 mg/dL — ABNORMAL HIGH (ref 70–99)
Glucose-Capillary: 122 mg/dL — ABNORMAL HIGH (ref 70–99)
Glucose-Capillary: 90 mg/dL (ref 70–99)
Glucose-Capillary: 96 mg/dL (ref 70–99)

## 2020-12-30 MED ORDER — THIAMINE HCL 100 MG PO TABS
100.0000 mg | ORAL_TABLET | Freq: Every day | ORAL | Status: DC
  Filled 2020-12-30: qty 1

## 2020-12-30 MED ORDER — THIAMINE HCL 100 MG/ML IJ SOLN
100.0000 mg | Freq: Every day | INTRAMUSCULAR | Status: DC
  Administered 2020-12-30 – 2021-01-02 (×4): 100 mg via INTRAVENOUS
  Filled 2020-12-30 (×4): qty 1

## 2020-12-30 NOTE — Progress Notes (Signed)
Nutrition Follow-up  DOCUMENTATION CODES:  Not applicable  INTERVENTION:  Tolerated sips of C/L yesterday evening and wants to continue to try C/L  Continues on Osmolite 1.5 at  goal rate of 50 ml/hr.   45 ml Prosource TF BID.    Tube feeding regimen provides 1880 kcal (100% of needs for repletion), 97 grams of protein, and 914 ml of free H2O.   Additional IVF of 0.9% sodium chloride at 10 ml/hr  25(OH)D level 15 ng/ml on 7/16 - consider Vitamin D supplements  Additional supplements: PNV q day Thiamine 500  mg IV q day - CHF Iron 60 mg q day Folic acid 1 mg q day B6  Continues with baseline emesis   NUTRITION DIAGNOSIS:  Inadequate oral intake related to nausea, vomiting as evidenced by NPO status. Ongoing  GOAL:  Patient will meet greater than or equal to 90% of their needs Weight gain  MONITOR:  Diet advancement, Labs, Weight trends, TF tolerance, Skin, I & O's  REASON FOR ASSESSMENT:  Enteral support/ tubefeedings  ASSESSMENT:  30 y.o. female with medical history significant of [redacted]w[redacted]d on presentation, now 27 1/7 weeks , Type 1 diabetic, Cardiomyopathy, chronic systolic. Multiple recent admissions for hyperemesis/ pneumonia [facility] )  7/27- cortrak placed- post-pyloric placement  Pt with positive affect today- ambulating with help  Lab Results  Component Value Date   HGBA1C 5.8 (H) 11/21/2020    Diet Order:   Diet Order             Diet clear liquid Room service appropriate? Yes; Fluid consistency: Thin  Diet effective now                  EDUCATION NEEDS:   No education needs have been identified at this time  Skin:  Skin Assessment: Reviewed RN Assessment  Last BM:  daily   Height:   Ht Readings from Last 1 Encounters:  12/17/20 5\' 1"  (1.549 m)    Weight:   Wt Readings from Last 1 Encounters:  12/30/20 68.6 kg    Ideal Body Weight:  47.7 kg  BMI:  Body mass index is 28.57 kg/m.  Estimated Nutritional Needs:   Kcal:   1700-1900  Protein:  90-105 grams  Fluid:  > 1.7 L  03/01/21 M.M LDN Neonatal Nutrition Support Specialist/RD III

## 2020-12-30 NOTE — Progress Notes (Signed)
Physical Therapy Treatment Patient Details Name: Ashley Camacho MRN: [id number] DOB: December 12, 1990 Today's Date: 12/30/2020    History of Present Illness 30 y.o. female admitted for hyperemesis gravidarum. Medical history significant of [redacted]w[redacted]d on presentation, Estimated Date of Delivery: 03/30/21 by by 9 week ultrasound, Type 1 diabetic, Cardiomyopathy, chronic systolic heart failure, DVT in bilateral arm. H/o recent hospitalizations d/t hyperemesis and sepsis secondary to pneumonia    PT Comments    Pt continues to have limited activity tolerance due to deconditioning and nausea/vomiting. Expect pt will progress quickly if nausea resolves.    Follow Up Recommendations  No PT follow up     Equipment Recommendations  None recommended by PT    Recommendations for Other Services       Precautions / Restrictions Precautions Precautions: Other (comment) Precaution Comments: frequent vomiting, cotrak    Mobility  Bed Mobility Overal bed mobility: Modified Independent       Supine to sit: Modified independent (Device/Increase time);HOB elevated          Transfers Overall transfer level: Modified independent Equipment used: None Transfers: Sit to/from Stand Sit to Stand: Modified independent (Device/Increase time)            Ambulation/Gait Ambulation/Gait assistance: Supervision Gait Distance (Feet): 80 Feet (80' x 1, 50' x 1, 10' x 1) Assistive device: None Gait Pattern/deviations: Step-through pattern;Decreased stride length;Wide base of support Gait velocity: decr Gait velocity interpretation: 1.31 - 2.62 ft/sec, indicative of limited community ambulator General Gait Details: Pt fatigues quickly with activity and required seated rest break. Pt with continued nauses but no vomiting during treatment.   Stairs             Wheelchair Mobility    Modified Rankin (Stroke Patients Only)       Balance Overall balance assessment: Mild deficits observed, not  formally tested                                          Cognition Arousal/Alertness: Awake/alert Behavior During Therapy: Flat affect Overall Cognitive Status: Within Functional Limits for tasks assessed                                        Exercises      General Comments        Pertinent Vitals/Pain Pain Assessment: No/denies pain    Home Living                      Prior Function            PT Goals (current goals can now be found in the care plan section) Progress towards PT goals: Progressing toward goals    Frequency    Min 2X/week      PT Plan Current plan remains appropriate    Co-evaluation              AM-PAC PT "6 Clicks" Mobility   Outcome Measure  Help needed turning from your back to your side while in a flat bed without using bedrails?: None Help needed moving from lying on your back to sitting on the side of a flat bed without using bedrails?: None Help needed moving to and from a bed to a chair (including a wheelchair)?: None Help needed standing  up from a chair using your arms (e.g., wheelchair or bedside chair)?: None Help needed to walk in hospital room?: None Help needed climbing 3-5 steps with a railing? : A Little 6 Click Score: 23    End of Session   Activity Tolerance: Patient limited by fatigue;Other (comment) (nausea) Patient left: in bed;with call bell/phone within reach Nurse Communication: Mobility status PT Visit Diagnosis: Other abnormalities of gait and mobility (R26.89);Muscle weakness (generalized) (M62.81)     Time: 0947-0962 PT Time Calculation (min) (ACUTE ONLY): 20 min  Charges:  $Gait Training: 8-22 mins                     [facility] PT Acute Rehabilitation Services Pager [phone] Office [phone]    Angelina Ok [facility] 12/30/2020, 2:43 PM

## 2020-12-30 NOTE — Progress Notes (Signed)
Patient ID: Ashley Camacho, female   DOB: 1990/08/20, 30 y.o.   MRN: [id number] ACULTY PRACTICE ANTEPARTUM COMPREHENSIVE PROGRESS NOTE  ASTON LAWHORN is a 30 y.o. G2P0101 at [redacted]w[redacted]d  who is admitted to IM service for management of medical complications in setting of second trimester pregnancy.  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  Fetal presentation is cephalic on U/S 12/29/20  Length of Stay:  14  Days  Subjective: No obstetric complaints this morning. Patient reports good fetal movement.  She reports no uterine contractions, no bleeding and no loss of fluid per vagina.  Vitals:  Blood pressure (!) 120/51, pulse 90, temperature 98 F (36.7 C), temperature source Oral, resp. rate 16, height 5\' 1"  (1.549 m), weight 68.6 kg, SpO2 95 %. Physical Examination: Gen NAD Lungs normal breath sounds Heart RRR Abd soft, NT, gravid Ext 1-2 + edema  Fetal Monitoring:  Baseline: 135's bpm, Variability: Moderate {6-25 bpm), Accelerations: Reactive, and Decelerations: Absent >> Category 1   Labs:  Results for orders placed or performed during the hospital encounter of 12/16/20 (from the past 24 hour(s))  Glucose, capillary   Collection Time: 12/29/20 12:23 PM  Result Value Ref Range   Glucose-Capillary 120 (H) 70 - 99 mg/dL  Glucose, capillary   Collection Time: 12/29/20  6:08 PM  Result Value Ref Range   Glucose-Capillary 95 70 - 99 mg/dL  Glucose, capillary   Collection Time: 12/29/20  8:23 PM  Result Value Ref Range   Glucose-Capillary 85 70 - 99 mg/dL  Glucose, capillary   Collection Time: 12/30/20 12:07 AM  Result Value Ref Range   Glucose-Capillary 122 (H) 70 - 99 mg/dL  CBC with Differential/Platelet   Collection Time: 12/30/20  4:00 AM  Result Value Ref Range   WBC 5.3 4.0 - 10.5 K/uL   RBC 2.63 (L) 3.87 - 5.11 MIL/uL   Hemoglobin 7.6 (L)  12.0 - 15.0 g/dL   HCT 78.2 (L) 95.6 - 21.3 %   MCV 93.2 80.0 - 100.0 fL   MCH 28.9 26.0 - 34.0 pg   MCHC 31.0 30.0 - 36.0 g/dL   RDW 08.6 (H) 57.8 - 46.9 %   Platelets 181 150 - 400 K/uL   nRBC 0.0 0.0 - 0.2 %   Neutrophils Relative % 70 %   Neutro Abs 3.7 1.7 - 7.7 K/uL   Lymphocytes Relative 18 %   Lymphs Abs 1.0 0.7 - 4.0 K/uL   Monocytes Relative 8 %   Monocytes Absolute 0.4 0.1 - 1.0 K/uL   Eosinophils Relative 3 %   Eosinophils Absolute 0.2 0.0 - 0.5 K/uL   Basophils Relative 1 %   Basophils Absolute 0.1 0.0 - 0.1 K/uL   Immature Granulocytes 0 %   Abs Immature Granulocytes 0.02 0.00 - 0.07 K/uL  Basic metabolic panel   Collection Time: 12/30/20  4:00 AM  Result Value Ref Range   Sodium 141 135 - 145 mmol/L   Potassium 4.4 3.5 - 5.1 mmol/L   Chloride 110 98 - 111 mmol/L   CO2 25 22 - 32 mmol/L   Glucose, Bld 99 70 - 99 mg/dL   BUN 35 (H) 6 - 20 mg/dL   Creatinine, Ser 6.29 (H) 0.44 - 1.00 mg/dL   Calcium 8.5 (L) 8.9 - 10.3 mg/dL   GFR, Estimated 56 (L) >60 mL/min   Anion gap 6 5 -  15  Glucose, capillary   Collection Time: 12/30/20  4:09 AM  Result Value Ref Range   Glucose-Capillary 96 70 - 99 mg/dL  Glucose, capillary   Collection Time: 12/30/20  8:08 AM  Result Value Ref Range   Glucose-Capillary 90 70 - 99 mg/dL   Comment 1 Notify RN    Comment 2 Document in Chart     Imaging Studies:    US Abdomen Limited  Result Date: 12/26/2020 CLINICAL DATA:  Palpable mass of left lower abdomen, pain EXAM: ULTRASOUND ABDOMEN LIMITED COMPARISON:  None. FINDINGS: Targeted ultrasound examination of the patient identified area of pain and palpable abnormality in the left lower quadrant reveals a multilobulated cystic lesion measuring 1.2 x 1.0 x 1.2 cm in the subcutaneous fat. IMPRESSION: Multiloculated cystic lesion in the subcutaneous fat of the left lower abdomen. This may reflect a small abscess if clinically referable signs and symptoms are patent. Electronically Signed    By: Lauralyn Primes M.D.   On: 12/26/2020 15:02   DG Chest Port 1 View  Result Date: 12/27/2020 CLINICAL DATA:  Respiratory distress. EXAM: PORTABLE CHEST 1 VIEW COMPARISON:  12/23/2020 FINDINGS: Cardiomegaly with pulmonary vascular congestion and slightly increased interstitial opacities/edema noted. Slightly increasing pleural effusions noted, small to moderate on the RIGHT and trace to small on the LEFT. Bibasilar opacities are increased, favor atelectasis/edema. A LEFT PICC line with tip overlying SUPERIOR cavoatrial junction and small bore feeding tube entering the stomach with tip off the field of view noted. There is no evidence of pneumothorax. IMPRESSION: 1. Cardiomegaly with pulmonary vascular congestion and slightly increased interstitial opacities/edema. 2. Slightly increasing pleural effusions, small to moderate on the RIGHT and trace to small on the LEFT. 3. Increased bibasilar opacities, favor atelectasis or edema. Electronically Signed   By: Harmon Pier M.D.   On: 12/27/2020 13:41   DG CHEST PORT 1 VIEW  Result Date: 12/23/2020 CLINICAL DATA:  Edema.  Pregnancy. EXAM: PORTABLE CHEST 1 VIEW COMPARISON:  12/17/2020. FINDINGS: Interim placement of feeding tube, tip appears to be over the duodenum. Left PICC line in stable position. Heart size normal. Bilateral pulmonary infiltrates/edema, slightly improved from prior exam. Moderate right pleural effusion again noted without interim change. Shielding is noted over the abdomen. IMPRESSION: 1. Interim placement of feeding tube, tip appears to be over the duodenum. Left PICC line in stable position. 2. Bilateral pulmonary infiltrates/edema slightly improved from prior exam. Moderate right pleural effusion again noted without interim change. Electronically Signed   By: Maisie Fus  Register   On: 12/23/2020 11:23   DG CHEST PORT 1 VIEW  Result Date: 12/17/2020 CLINICAL DATA:  Radiograph 12/13/2020 EXAM: PORTABLE CHEST 1 VIEW COMPARISON:  CT 11/22/2020  FINDINGS: Perihilar and basilar predominant hazy and patchy opacities with pulmonary vascular congestion and cuffing most consistent with pulmonary edema. Layering bilateral pleural effusions including fluid tracking in the right fissures. No pneumothorax. Cardiac size is similar to comparison prior. Left upper extremity PICC tip terminates at the right atrium. Telemetry leads overlie the chest. Edematous changes seen in the chest wall soft tissues. No acute or worrisome osseous abnormality. IMPRESSION: Findings most compatible with pulmonary edema and developing bilateral effusions. Passive basilar atelectasis is likely present as well. Electronically Signed   By: Kreg Shropshire M.D.   On: 12/17/2020 01:59   DG Chest Port 1 View  Result Date: 12/13/2020 CLINICAL DATA:  Difficulty breathing. EXAM: PORTABLE CHEST 1 VIEW COMPARISON:  November 24, 2020. FINDINGS: The heart size and mediastinal contours  are within normal limits. Left lung is clear. Mild right basilar opacity is noted concerning for atelectasis and possible associated effusion. No pneumothorax is noted. The visualized skeletal structures are unremarkable. IMPRESSION: Mild right basilar opacity is noted concerning for atelectasis and possible associated pleural effusion. Electronically Signed   By: Lupita Raider M.D.   On: 12/13/2020 11:22   DG Abd Portable 1V  Result Date: 12/17/2020 CLINICAL DATA:  Feeding tube placement EXAM: PORTABLE ABDOMEN - 1 VIEW COMPARISON:  None. FINDINGS: Feeding tube is transpyloric with the tip in the distal duodenum near the ligament of Treitz. IMPRESSION: Feeding tube tip in the distal duodenum. Electronically Signed   By: Charlett Nose M.D.   On: 12/17/2020 15:25   ECHOCARDIOGRAM COMPLETE  Result Date: 12/17/2020    ECHOCARDIOGRAM REPORT   Patient Name:   Ashley Camacho Date of Exam: 12/17/2020 Medical Rec #:  [id number]       Height:       61.0 in Accession #:    [id number]      Weight:       125.7 lb Date of Birth:   1990-12-16      BSA:          1.550 m Patient Age:    29 years        BP:           122/66 mmHg Patient Gender: F               HR:           103 bpm. Exam Location:  Inpatient Procedure: 2D Echo, 3D Echo, Color Doppler, Cardiac Doppler and Strain Analysis Indications:    I50.21 Acute systolic (congestive) heart failure  History:        Patient has prior history of Echocardiogram examinations, most                 recent 11/25/2020. Risk Factors:Hypertension and Diabetes. Prior                 performed at [facility].  Sonographer:    Irving Burton Senior RDCS Referring Phys: [id number] ANASTASSIA DOUTOVA  Sonographer Comments: [redacted] weeks pregnant at time of study. IMPRESSIONS  1. Left ventricular ejection fraction, by estimation, is 40 to 45%. The left ventricle has mildly decreased function. The left ventricle demonstrates global hypokinesis. Left ventricular diastolic parameters are indeterminate.  2. Right ventricular systolic function is normal. The right ventricular size is mildly enlarged. There is normal pulmonary artery systolic pressure. The estimated right ventricular systolic pressure is 29.2 mmHg.  3. Right atrial size was mildly dilated.  4. A small pericardial effusion is present.  5. The mitral valve is normal in structure. Mild to moderate mitral valve regurgitation.  6. The aortic valve is tricuspid. Aortic valve regurgitation is not visualized. No aortic stenosis is present.  7. The inferior vena cava is normal in size with greater than 50% respiratory variability, suggesting right atrial pressure of 3 mmHg. FINDINGS  Left Ventricle: Left ventricular ejection fraction, by estimation, is 40 to 45%. The left ventricle has mildly decreased function. The left ventricle demonstrates global hypokinesis. The left ventricular internal cavity size was normal in size. There is  no left ventricular hypertrophy. Left ventricular diastolic parameters are indeterminate. Right Ventricle: The right ventricular size is mildly enlarged.  No increase in right ventricular wall thickness. Right ventricular systolic function is normal. There is normal pulmonary artery systolic pressure. The tricuspid regurgitant velocity is 2.56  m/s, and with an assumed right atrial pressure of 3 mmHg, the estimated right ventricular systolic pressure is 29.2 mmHg. Left Atrium: Left atrial size was normal in size. Right Atrium: Right atrial size was mildly dilated. Pericardium: A small pericardial effusion is present. Mitral Valve: The mitral valve is normal in structure. Mild to moderate mitral valve regurgitation. Tricuspid Valve: The tricuspid valve is normal in structure. Tricuspid valve regurgitation is mild. Aortic Valve: The aortic valve is tricuspid. Aortic valve regurgitation is not visualized. No aortic stenosis is present. Pulmonic Valve: The pulmonic valve was grossly normal. Pulmonic valve regurgitation is trivial. Aorta: The aortic root and ascending aorta are structurally normal, with no evidence of dilitation. Venous: The inferior vena cava is normal in size with greater than 50% respiratory variability, suggesting right atrial pressure of 3 mmHg. IAS/Shunts: The interatrial septum was not well visualized.  LEFT VENTRICLE PLAX 2D LVIDd:         4.10 cm  Diastology LVIDs:         3.50 cm  LV e' medial:    8.70 cm/s LV PW:         1.00 cm  LV E/e' medial:  16.7 LV IVS:        0.90 cm  LV e' lateral:   11.30 cm/s LVOT diam:     1.60 cm  LV E/e' lateral: 12.8 LV SV:         50 LV SV Index:   33 LVOT Area:     2.01 cm  RIGHT VENTRICLE RV S prime:     10.60 cm/s TAPSE (M-mode): 1.6 cm LEFT ATRIUM             Index       RIGHT ATRIUM           Index LA diam:        4.00 cm 2.58 cm/m  RA Area:     17.40 cm LA Vol (A2C):   48.2 ml 31.09 ml/m RA Volume:   49.00 ml  31.61 ml/m LA Vol (A4C):   35.2 ml 22.71 ml/m LA Biplane Vol: 44.4 ml 28.64 ml/m  AORTIC VALVE LVOT Vmax:   123.00 cm/s LVOT Vmean:  93.400 cm/s LVOT VTI:    0.251 m  AORTA Ao Root diam: 2.50 cm  Ao Asc diam:  2.70 cm MITRAL VALVE                TRICUSPID VALVE MV Area (PHT): 4.80 cm     TR Peak grad:   26.2 mmHg MV Decel Time: 158 msec     TR Vmax:        256.00 cm/s MV E velocity: 145.00 cm/s MV A velocity: 45.80 cm/s   SHUNTS MV E/A ratio:  3.17         Systemic VTI:  0.25 m                             Systemic Diam: 1.60 cm Epifanio Lesches MD Electronically signed by Epifanio Lesches MD Signature Date/Time: 12/17/2020/10:41:24 AM    Final    VAS [place] UPPER EXTREMITY VENOUS DUPLEX  Result Date: 12/20/2020 UPPER VENOUS STUDY  Patient Name:  ELIZA GREEN  Date of Exam:   12/20/2020 Medical Rec #: [id number]        Accession #:    [id number] Date of Birth: 06-01-1990       Patient Gender: F Patient Age:  40Y [facility] Procedure:      VAS [place] UPPER EXTREMITY VENOUS DUPLEX Referring Phys: [id number] DAWOOD S ELGERGAWY --------------------------------------------------------------------------------  Indications: F/U DVT in right brachial found at [facility] 12/07/20 Anticoagulation: Eliquis. Comparison Study: No prior study at [facility] Prior right UEV done at [facility] 12/07/20 Performing Technologist: Sherren Kerns RVS  Examination Guidelines: A complete evaluation includes B-mode imaging, spectral Doppler, color Doppler, and power Doppler as needed of all accessible portions of each vessel. Bilateral testing is considered an integral part of a complete examination. Limited examinations for reoccurring indications may be performed as noted.  Right Findings: +----------+------------+---------+-----------+----------+---------------+ RIGHT     CompressiblePhasicitySpontaneousProperties    Summary     +----------+------------+---------+-----------+----------+---------------+ IJV           Full       Yes       Yes                              +----------+------------+---------+-----------+----------+---------------+ Subclavian               Yes       Yes                               +----------+------------+---------+-----------+----------+---------------+ Axillary                 Yes       Yes                              +----------+------------+---------+-----------+----------+---------------+ Brachial      None       No        No                   Chronic     +----------+------------+---------+-----------+----------+---------------+ Radial        Full                                                  +----------+------------+---------+-----------+----------+---------------+ Ulnar                                               patent by color +----------+------------+---------+-----------+----------+---------------+ Cephalic    Partial                                     Chronic     +----------+------------+---------+-----------+----------+---------------+ Basilic       Full                                                  +----------+------------+---------+-----------+----------+---------------+  Left Findings: +----------+------------+---------+-----------+----------+-------+ LEFT      CompressiblePhasicitySpontaneousPropertiesSummary +----------+------------+---------+-----------+----------+-------+ Subclavian               Yes       Yes                      +----------+------------+---------+-----------+----------+-------+  Summary:  Right: Findings consistent with acute superficial vein thrombosis involving the right cephalic vein. 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  Left: No evidence of thrombosis in the subclavian.  *See table(s) above for measurements and observations.  Diagnosing physician: Waverly Ferrari MD Electronically signed by Waverly Ferrari MD on 12/20/2020 at 6:45:02 PM.    Final    ECHOCARDIOGRAM LIMITED  Result Date: 12/28/2020    ECHOCARDIOGRAM LIMITED REPORT   Patient Name:   Ashley Camacho Date of Exam: 12/28/2020 Medical Rec #:  [id number]        Height:       61.0 in Accession #:    [id number]      Weight:       153.7 lb Date of Birth:  03-14-1991      BSA:          1.689 m Patient Age:    29 years        BP:           122/49 mmHg Patient Gender: F               HR:           97 bpm. Exam Location:  Inpatient Procedure: Cardiac Doppler, Color Doppler and Limited Echo Indications:    Pericardial effusion                 CHF-Acute systolic  History:        Patient has prior history of Echocardiogram examinations, most                 recent 12/17/2020. Risk Factors:Hypertension and Diabetes.  Sonographer:    Ross Ludwig RDCS (AE) Referring Phys: [id number] Parke Poisson  Sonographer Comments: [redacted] weeks pregnant at time of study IMPRESSIONS  1. A small pericardial effusion is present. The pericardial effusion is posterior to the left ventricle. There is no evidence of cardiac tamponade.  2. Left ventricular ejection fraction, by estimation, is 50 to 55%. The left ventricle has low normal function.  3. Right ventricular systolic function is normal. The right ventricular size is normal. There is normal pulmonary artery systolic pressure. The estimated right ventricular systolic pressure is 34.4 mmHg.  4. The mitral valve is grossly normal. Mild to moderate mitral valve regurgitation.  5. Tricuspid valve regurgitation is moderate.  6. The aortic valve is normal in structure. Aortic valve regurgitation is not visualized.  7. The inferior vena cava is normal in size with <50% respiratory variability, suggesting right atrial pressure of 8 mmHg. Comparison(s): A prior study was performed on 12/17/20. Prior images reviewed side by side. LVEF has improved slightly. No change in pericardial effusion. FINDINGS  Left Ventricle: Left ventricular ejection fraction, by estimation, is 50 to 55%. The left ventricle has low normal function. Right Ventricle: The right ventricular size is normal. Right ventricular systolic function is normal. There is normal pulmonary artery  systolic pressure. The tricuspid regurgitant velocity is 2.57 m/s, and with an assumed right atrial pressure of 8 mmHg,  the estimated right ventricular systolic pressure is 34.4 mmHg. Pericardium: A small pericardial effusion is present. The pericardial effusion is posterior to the left ventricle. There is no evidence of cardiac tamponade. Mitral Valve: The mitral valve is grossly normal. Mild to moderate mitral valve regurgitation. Tricuspid Valve: The tricuspid valve is grossly normal. Tricuspid valve regurgitation is moderate. Aortic Valve: The aortic valve is normal in structure. Aortic valve regurgitation is  not visualized. Pulmonic Valve: Pulmonic valve regurgitation is trivial. Venous: The inferior vena cava is normal in size with less than 50% respiratory variability, suggesting right atrial pressure of 8 mmHg. LEFT VENTRICLE PLAX 2D LVOT diam:     1.60 cm LVOT Area:     2.01 cm  IVC IVC diam: 1.70 cm  AORTA Ao Asc diam: 2.60 cm TRICUSPID VALVE TR Peak grad:   26.4 mmHg TR Vmax:        257.00 cm/s  SHUNTS Systemic Diam: 1.60 cm Weston Brass MD Electronically signed by Weston Brass MD Signature Date/Time: 12/28/2020/10:16:52 AM    Final    [place] EKG SITE RITE  Result Date: 12/17/2020 If Site Rite image not attached, placement could not be confirmed due to current cardiac rhythm.  [place] EKG SITE RITE  Result Date: 12/13/2020 If [facility] image not attached, placement could not be confirmed due to current cardiac rhythm.  [place] MFM OB FOLLOW UP  Result Date: 12/22/2020 ----------------------------------------------------------------------  OBSTETRICS REPORT                       (Signed Final 12/22/2020 06:39 pm) ---------------------------------------------------------------------- Patient Info  ID #:       [id number]                          D.O.B.:  1990/08/02 (29 yrs)  Name:       ILYSE TREMAIN [facility]                 Visit Date: 12/22/2020 08:54 am  ---------------------------------------------------------------------- Performed By  Attending:        Ma Rings MD         Ref. Address:     [facility]                                                             [facility]                                                             [address]                                                             [address]                                                             [address]  [place]                                                             [address]  Performed By:     Sandi Mealy        [facility]  Referred By:      Linzie Collin MD ---------------------------------------------------------------------- Orders  #  Description                           Code        Ordered By  1  [place] MFM OB FOLLOW UP                   [phone]    Mariel Aloe ----------------------------------------------------------------------  #  Order #                     Accession #                Episode #  1  [id number]                   [id number]                 [id number] ---------------------------------------------------------------------- Indications  Pre-existing diabetes, type 1, in pregnancy,   O24.012  second trimester  Anemia during pregnancy in second trimester    O99.012  [redacted] weeks gestation of pregnancy                Z3A.26  Encounter for other antenatal screening        Z36.2  follow-up ---------------------------------------------------------------------- Fetal Evaluation  Num Of Fetuses:         1  Fetal Heart Rate(bpm):  132  Cardiac Activity:       Observed  Presentation:           Breech  Placenta:               Posterior  P. Cord Insertion:      Visualized  Amniotic Fluid  AFI FV:      Polyhydramnios                              Largest Pocket(cm)                              8.5  ---------------------------------------------------------------------- Biometry  BPD:      66.4  mm     G. Age:  26w 5d         67  %    CI:        77.48   %    70 -  86                                                          FL/HC:      18.4   %    18.6 - 20.4  HC:      238.8  mm     G. Age:  26w 0d         24  %    HC/AC:      1.01        1.04 - 1.22  AC:      235.9  mm     G. Age:  27w 6d         91  %    FL/BPD:     66.3   %    71 - 87  FL:         44  mm     G. Age:  24w 3d          5  %    FL/AC:      18.7   %    20 - 24  HUM:      43.1  mm     G. Age:  25w 5d         40  %  Est. FW:     940  gm      2 lb 1 oz     59  % ---------------------------------------------------------------------- Gestational Age  U/S Today:     26w 2d                                        EDD:   03/28/21  Best:          Altamese Cabal 0d     Det. By:  Marcella Dubs         EDD:   03/30/21                                      (08/27/20) ---------------------------------------------------------------------- Anatomy  Cranium:               Appears normal         Aortic Arch:            Previously seen  Cavum:                 Previously seen        Ductal Arch:            Previously seen  Ventricles:            Appears normal         Diaphragm:              Appears normal  Choroid Plexus:        Previously seen        Stomach:                Appears normal, left  sided  Cerebellum:            Previously seen        Abdomen:                Previously seen  Posterior Fossa:       Previously seen        Abdominal Wall:         Previously seen  Nuchal Fold:           Previously seen        Cord Vessels:           Previously seen  Lips:                  Previously seen        Kidneys:                Previously seen  Thoracic:              Appears normal         Bladder:                Appears normal  Heart:                 Previously seen        Spine:                  Previously  seen  RVOT:                  Previously seen        Upper Extremities:      Previously seen  LVOT:                  Previously seen        Lower Extremities:      Previously seen ---------------------------------------------------------------------- Cervix Uterus Adnexa  Cervix  Length:           4.74  cm.  Normal appearance by transabdominal scan. ---------------------------------------------------------------------- Comments  This patient has been hospitalized due to complications  related to diabetes.  The fetal growth appears appropriate for her gestational age.  Mild polyhydramnios with a maximal vertical pocket of greater  than 8 cm was noted today.  Should have another growth ultrasound performed in 4  weeks. ----------------------------------------------------------------------                   Ma Rings, MD Electronically Signed Final Report   12/22/2020 06:39 pm ----------------------------------------------------------------------  [place] MFM OB LIMITED  Result Date: 12/30/2020 ----------------------------------------------------------------------  OBSTETRICS REPORT                       (Signed Final 12/30/2020 08:16 am) ---------------------------------------------------------------------- Patient Info  ID #:       [id number]                          D.O.B.:  [birth year] (29 yrs)  Name:       NANCI LAKATOS [facility]                 Visit Date: 12/29/2020 06:06 pm ---------------------------------------------------------------------- Performed By  Attending:        Noralee Space MD        Ref. Address:     [facility]                                                             [address]                                                             [address]                                                             [address]                                                             [place]                                                              [id number]  Performed By:     Hurman HornAlyssa Keatts          [facility]  Referred By:      Linzie CollinAVID JAMES                    [name] ---------------------------------------------------------------------- Orders  #  Description                           Code        Ordered By  1  US MFM OB LIMITED                     [id number]    Jaynie CollinsUGONNA Kinan Safley ----------------------------------------------------------------------  #  Order #                     Accession #                Episode #  1  [id number]                   [id number]                 [id number] ---------------------------------------------------------------------- Indications  [redacted] weeks gestation of pregnancy  Z3A.27  Polyhydramnios, second trimester,              O40.2XX0  antepartum condition or complication, fetus  unspecified  Pre-existing diabetes, type 1, in pregnancy,   O24.012  second trimester  Hyperemesis gravidarum                         O21.0  Anemia during pregnancy in second trimester    O99.012  Abnormal fetal ultrasound (? liver             O28.9  calcification) ---------------------------------------------------------------------- Fetal Evaluation  Num Of Fetuses:         1  Fetal Heart Rate(bpm):  148  Cardiac Activity:       Observed  Presentation:           Cephalic  Placenta:               Posterior  P. Cord Insertion:      Visualized  Amniotic Fluid  AFI FV:      Polyhydramnios                              Largest Pocket(cm)                              10.3  Comment:    Stomach, bladder, and diaphragm seen. ---------------------------------------------------------------------- Gestational Age  Best:          27w 0d     Det. ByMarcella Dubs         EDD:   03/30/21                                      (08/27/20) ---------------------------------------------------------------------- Anatomy  Abdomen:               Echogenic Focus  ---------------------------------------------------------------------- Cervix Uterus Adnexa  Cervix  Length:           3.87  cm.  Normal appearance by transabdominal scan.  Uterus  No abnormality visualized.  Right Ovary  Not visualized.  Left Ovary  Not visualized.  Adnexa  No abnormality visualized. ---------------------------------------------------------------------- Impression  Patient is admitted for control of diabetes.  A limited ultrasound study was performed .Mild  polyhydramnios is seen. A solitary echogenic focus in the  liver consistent with intrahepatic calcification is seen. Patient  had cell-free fetal DNA screening that showed low risks for  fetal aneuploidies. Fetal anatomical survey performed at 20  weeks was reported as normal. In the absence of associated  anomalies, the risk of chromosomal defects is not increased.  Will counsel the patient on the finding at our next visit. ---------------------------------------------------------------------- Recommendations  Follow-up limited scan next week. ----------------------------------------------------------------------                  Noralee Space, MD Electronically Signed Final Report   12/30/2020 08:16 am ----------------------------------------------------------------------    Medications:  Scheduled  bacitracin   Topical BID   Chlorhexidine Gluconate Cloth  6 each Topical Daily   doxylamine (Sleep)  12.5 mg Oral QHS   enoxaparin  70 mg Subcutaneous Q12H   feeding supplement (PROSource TF)  45 mL Per Tube BID   ferrous sulfate  300 mg Per Tube QODAY   folic acid  1 mg Intravenous  Daily   hydrALAZINE  25 mg Per Tube Q8H   mouth rinse  15 mL Mouth Rinse q12n4p   metoCLOPramide (REGLAN) injection  5 mg Intravenous Q6H   pantoprazole (PROTONIX) IV  40 mg Intravenous Q12H   prenatal vitamin w/FE, FA  1 tablet Oral Q1200   scopolamine  1 patch Transdermal Q72H   sodium chloride flush  10-40 mL Intracatheter Q12H   sodium chloride  flush  3 mL Intravenous Q12H   vitamin B-6  12.5 mg Oral QHS   I have reviewed the patient's current medications.  ASSESSMENT & Plan  IUP [redacted]w[redacted]d Multiple medical complications, managed by [facility] team  Stable from OB stand point. MFM consult on chart. Stable ultrasound. They will see again this week and make recommendations on timing of delivery.  Fetal well being reassuring at present. Appreciate IM and Cardiology care for pt.   Jaynie Collins, MD 12/30/2020,11:02 AM

## 2020-12-30 NOTE — Progress Notes (Signed)
This chaplain responded to [facility] referral for Advance Directive education.  The Pt. agreed to education on the purpose of an Advance Directive.  The chaplain understands the Pt. has a person in mind for the role of HCPOA. The Pt. agreed to contact the spiritual care office if/when the Pt. wants to complete the document in the hospital.  The chaplain updated the Pt. RN and is available for F/U as needed.

## 2020-12-30 NOTE — Progress Notes (Addendum)
PROGRESS NOTE    Ashley Camacho  QIW:[id number] DOB: [birth year] DOA: 12/16/2020 PCP: Hildred Laser, MD   Chief Complain:Nausea,vomiting  Brief Narrative:  Patient is a 30 year old female G2,P1 at [redacted] weeks gestation who has a history of type 1 diabetes mellitus.  She presented to the emergency room for evaluation of persistent nausea and vomiting .  She was recently with multiple hospitalization, at [facility] for sepsis secondary to pneumonia and was there for 2 weeks.  Couple hospitalization at [facility] due to intractable nausea and vomiting. -Transferred from [facility] to [facility] 7/26 given her severe malnutrition, with hyperemesis gravidarum, for consideration of cortrak tube insertion.  Patient had core track inserted on admission, so far tolerating tube feed (she remains with significant nausea and vomiting, but this is at baseline and did not worsen with initiation of tube feed), as well her work-up was significant for acute CHF, requiring diuresis, cardiology following. We are giving trial of clear liquid diet .  Continues to have persistent nausea and vomiting  Assessment & Plan:   Active Problems:   Anemia affecting pregnancy in second trimester   AKI (acute kidney injury) (HCC)   Hyperemesis   Pregnancy with 24 completed weeks gestation   DKA, type 1, not at goal [facility])   Starvation ketoacidosis   Acute deep vein thrombosis (DVT) of brachial vein of right upper extremity (HCC)   Acute respiratory failure with hypoxia (HCC)   Vitreous hemorrhage of right eye (HCC)   Gastroparesis due to DM (HCC)   Diabetic retinopathy (HCC)   Acute on chronic combined systolic and diastolic CHF (congestive heart failure) (HCC)   Type 1 diabetes mellitus affecting pregnancy in second trimester, antepartum   HFrEF (heart failure with reduced ejection fraction) (HCC)   Intractable nausea/vomiting, severe protein calorie malnutrition, hyperemesis gravidarum/underlying gastroparesis:  Initially presented with persistent nausea and vomiting.  Was on TPN previously which was discontinued on 7/28.  Now being fed through tube.  Tolerating tube feeding.    Was previously started on a steroid for hyperemesis gravidarum but it was discontinued because it did not help.  Currently on Reglan, Zofran, scopolamine, Protonix, Compazine, Phenergan. We are giving trial of clear liquid diet .  Continues to have persistent nausea and vomiting. Goal is to remove the tube feeding at some point.  Type 1 diabetes: Uncontrolled with hyperglycemia alternating with hyperglycemia.  On insulin drip.  Follows with endocrinologist and takes insulin at home.  Cardiomyopathy/acute systolic congestive heart failure: Currently on hydralazine, as needed Lasix. Has severe bilateral lower extremity edema.  Echo done on 12/28/2020 showed ejection fraction of 50 to 55%, small pericardial effusion.  Echo done on 7/22 showed ejection fraction of 40 to 45%.  Cardiology following.  Lasix on hold due to AKI. Bilateral lower extremity edema could have been contributed due to pregnancy, hypoalbuminemia.  Right upper extremity DVT: On Lovenox  AKI on CKD stage II: Hospital course remarkable for AKI, diuresis on hold.  Continue to monitor.Improving  Normocytic anemia: Most likely  dilutional secondary to pregnancy hypervolemia.  Continue to monitor  Second trimester pregnancy: 27 weeks.  OB-GYN following.  Maternal-fetal medicine consulted for timing of delivery.      Nutrition Problem: Inadequate oral intake Etiology: nausea, vomiting      DVT prophylaxis:Lovenox Code Status: Full Family Communication: Boyfriend at the bedside on 8.8.22 Status is: Inpatient  Remains inpatient appropriate because:Inpatient level of care appropriate due to severity of illness  Dispo: The patient is from:  Home              Anticipated d/c is to: Home              Patient currently is not medically stable to d/c.   Difficult  to place patient No     Consultants: Cardiology, GYN  Procedures: None  Antimicrobials:  Anti-infectives (From admission, onward)    None       Subjective: Patient seen and examined at the bedside this morning.  Continues to have nausea and was vomiting.  Still wants to try clear liquid diet but has not been able to tolerate so far.  Denies any shortness of breath or cough..  Objective: Vitals:   12/30/20 0017 12/30/20 0415 12/30/20 0451 12/30/20 0501  BP: 127/61 134/62 (!) 133/58   Pulse: 92 91 89   Resp: 17 16    Temp: 97.8 F (36.6 C) 97.8 F (36.6 C) 98 F (36.7 C)   TempSrc: Axillary Axillary Oral   SpO2: 94% 94% 95%   Weight:    68.6 kg  Height:        Intake/Output Summary (Last 24 hours) at 12/30/2020 0739 Last data filed at 12/30/2020 0504 Gross per 24 hour  Intake 277.64 ml  Output 1225 ml  Net -947.36 ml   Filed Weights   12/28/20 0500 12/29/20 0500 12/30/20 0501  Weight: 69.7 kg 68.6 kg 68.6 kg    Examination:  General exam: Not in distress, weak appearing HEENT: PERRL Respiratory system:  no wheezes or crackles  Cardiovascular system: S1 & S2 heard, RRR.  Gastrointestinal system: Abdomen is distended, soft and nontender. Central nervous system: Alert and oriented Extremities: 2-3+ bilateral lower extremity pitting edema, no clubbing ,no cyanosis Skin: No rashes, no ulcers,no icterus      Data Reviewed: I have personally reviewed following labs and imaging studies  CBC: Recent Labs  Lab 12/25/20 0640 12/26/20 0330 12/27/20 0500 12/28/20 0658 12/30/20 0400  WBC 8.0 7.6 8.6 6.7 5.3  NEUTROABS  --   --   --   --  3.7  HGB 8.1* 7.2* 8.3* 8.3* 7.6*  HCT 25.4* 23.2* 26.6* 25.5* 24.5*  MCV 91.4 92.4 93.0 91.7 93.2  PLT 237 235 211 195 181   Basic Metabolic Panel: Recent Labs  Lab 12/24/20 0547 12/25/20 0640 12/26/20 0330 12/27/20 0500 12/28/20 0658 12/29/20 0220 12/30/20 0400  NA 140   < > 141 141 142 141 141  K 4.6   < > 4.4  4.4 4.5 4.3 4.4  CL 102   < > 106 108 108 107 110  CO2 28   < > 29 27 26 26 25   GLUCOSE 69*   < > 120* 91 113* 118* 99  BUN 26*   < > 27* 27* 30* 33* 35*  CREATININE 1.13*   < > 1.31* 1.36* 1.34* 1.41* 1.32*  CALCIUM 8.2*   < > 8.4* 8.6* 8.7* 8.5* 8.5*  MG 2.4  --   --   --   --  2.4  --   PHOS 5.2*  --   --   --   --   --   --    < > = values in this interval not displayed.   GFR: Estimated Creatinine Clearance: 55.7 mL/min (A) (by C-G formula based on SCr of 1.32 mg/dL (H)). Liver Function Tests: No results for input(s): AST, ALT, ALKPHOS, BILITOT, PROT, ALBUMIN in the last 168 hours. No results for input(s): LIPASE, AMYLASE  in the last 168 hours. No results for input(s): AMMONIA in the last 168 hours. Coagulation Profile: No results for input(s): INR, PROTIME in the last 168 hours. Cardiac Enzymes: No results for input(s): CKTOTAL, CKMB, CKMBINDEX, TROPONINI in the last 168 hours. BNP (last 3 results) No results for input(s): PROBNP in the last 8760 hours. HbA1C: No results for input(s): HGBA1C in the last 72 hours. CBG: Recent Labs  Lab 12/29/20 1223 12/29/20 1808 12/29/20 2023 12/30/20 0007 12/30/20 0409  GLUCAP 120* 95 85 122* 96   Lipid Profile: No results for input(s): CHOL, HDL, LDLCALC, TRIG, CHOLHDL, LDLDIRECT in the last 72 hours. Thyroid Function Tests: No results for input(s): TSH, T4TOTAL, FREET4, T3FREE, THYROIDAB in the last 72 hours. Anemia Panel: No results for input(s): VITAMINB12, FOLATE, FERRITIN, TIBC, IRON, RETICCTPCT in the last 72 hours. Sepsis Labs: No results for input(s): PROCALCITON, LATICACIDVEN in the last 168 hours.  Recent Results (from the past 240 hour(s))  Wet prep, genital     Status: None   Collection Time: 12/20/20  5:41 PM   Specimen: Vaginal  Result Value Ref Range Status   Yeast Wet Prep HPF POC NONE SEEN NONE SEEN Final   Trich, Wet Prep NONE SEEN NONE SEEN Final   Clue Cells Wet Prep HPF POC NONE SEEN NONE SEEN Final    WBC, Wet Prep HPF POC NONE SEEN NONE SEEN Final   Sperm NONE SEEN  Final    Comment: Performed at [facility], [address] [place], [place] [address]         Radiology Studies: ECHOCARDIOGRAM LIMITED  Result Date: 12/28/2020    ECHOCARDIOGRAM LIMITED REPORT   Patient Name:   Ashley Camacho Date of Exam: 12/28/2020 Medical Rec #:  [id number]       Height:       61.0 in Accession #:    [id number]      Weight:       153.7 lb Date of Birth:  Aug 25, 1990      BSA:          1.689 m Patient Age:    29 years        BP:           122/49 mmHg Patient Gender: F               HR:           97 bpm. Exam Location:  Inpatient Procedure: Cardiac Doppler, Color Doppler and Limited Echo Indications:    Pericardial effusion                 CHF-Acute systolic  History:        Patient has prior history of Echocardiogram examinations, most                 recent 12/17/2020. Risk Factors:Hypertension and Diabetes.  Sonographer:    Ross Ludwig RDCS (AE) Referring Phys: [id number] Parke Poisson  Sonographer Comments: [redacted] weeks pregnant at time of study IMPRESSIONS  1. A small pericardial effusion is present. The pericardial effusion is posterior to the left ventricle. There is no evidence of cardiac tamponade.  2. Left ventricular ejection fraction, by estimation, is 50 to 55%. The left ventricle has low normal function.  3. Right ventricular systolic function is normal. The right ventricular size is normal. There is normal pulmonary artery systolic pressure. The estimated right ventricular systolic pressure is 34.4 mmHg.  4. The mitral valve is grossly normal.  Mild to moderate mitral valve regurgitation.  5. Tricuspid valve regurgitation is moderate.  6. The aortic valve is normal in structure. Aortic valve regurgitation is not visualized.  7. The inferior vena cava is normal in size with <50% respiratory variability, suggesting right atrial pressure of 8 mmHg. Comparison(s): A prior study was performed on 12/17/20. Prior  images reviewed side by side. LVEF has improved slightly. No change in pericardial effusion. FINDINGS  Left Ventricle: Left ventricular ejection fraction, by estimation, is 50 to 55%. The left ventricle has low normal function. Right Ventricle: The right ventricular size is normal. Right ventricular systolic function is normal. There is normal pulmonary artery systolic pressure. The tricuspid regurgitant velocity is 2.57 m/s, and with an assumed right atrial pressure of 8 mmHg,  the estimated right ventricular systolic pressure is 34.4 mmHg. Pericardium: A small pericardial effusion is present. The pericardial effusion is posterior to the left ventricle. There is no evidence of cardiac tamponade. Mitral Valve: The mitral valve is grossly normal. Mild to moderate mitral valve regurgitation. Tricuspid Valve: The tricuspid valve is grossly normal. Tricuspid valve regurgitation is moderate. Aortic Valve: The aortic valve is normal in structure. Aortic valve regurgitation is not visualized. Pulmonic Valve: Pulmonic valve regurgitation is trivial. Venous: The inferior vena cava is normal in size with less than 50% respiratory variability, suggesting right atrial pressure of 8 mmHg. LEFT VENTRICLE PLAX 2D LVOT diam:     1.60 cm LVOT Area:     2.01 cm  IVC IVC diam: 1.70 cm  AORTA Ao Asc diam: 2.60 cm TRICUSPID VALVE TR Peak grad:   26.4 mmHg TR Vmax:        257.00 cm/s  SHUNTS Systemic Diam: 1.60 cm Weston Brass MD Electronically signed by Weston Brass MD Signature Date/Time: 12/28/2020/10:16:52 AM    Final         Scheduled Meds:  bacitracin   Topical BID   Chlorhexidine Gluconate Cloth  6 each Topical Daily   doxylamine (Sleep)  12.5 mg Oral QHS   enoxaparin  70 mg Subcutaneous Q12H   feeding supplement (PROSource TF)  45 mL Per Tube BID   ferrous sulfate  300 mg Per Tube QODAY   folic acid  1 mg Intravenous Daily   hydrALAZINE  25 mg Per Tube Q8H   mouth rinse  15 mL Mouth Rinse q12n4p    metoCLOPramide (REGLAN) injection  5 mg Intravenous Q6H   pantoprazole (PROTONIX) IV  40 mg Intravenous Q12H   prenatal vitamin w/FE, FA  1 tablet Oral Q1200   scopolamine  1 patch Transdermal Q72H   sodium chloride flush  10-40 mL Intracatheter Q12H   sodium chloride flush  3 mL Intravenous Q12H   vitamin B-6  12.5 mg Oral QHS   Continuous Infusions:  sodium chloride Stopped (12/19/20 1700)   feeding supplement (OSMOLITE 1.5 CAL) 1,000 mL (12/30/20 0640)   insulin 0.4 Units/hr (12/30/20 0712)   lactated ringers 10 mL/hr at 12/25/20 1957   promethazine (PHENERGAN)25mg  in 18mL NS 25 mg (12/28/20 0006)   thiamine injection 500 mg (12/29/20 1416)     LOS: 14 days    Time spent:25 mins. More than 50% of that time was spent in counseling and/or coordination of care.      Burnadette Pop, MD [facility] P8/01/2021, 7:39 AM

## 2020-12-30 NOTE — Progress Notes (Signed)
Progress Note  Patient Name: Ashley Camacho Date of Encounter: 12/30/2020  [facility] Cardiologist: None   Subjective   Doing well. Denies dyspnea. Weight stable. Net negative fluid balance without diuretics.  NSR 90 on telemetry. Creatinine improved slightly.  Inpatient Medications    Scheduled Meds:  bacitracin   Topical BID   Chlorhexidine Gluconate Cloth  6 each Topical Daily   doxylamine (Sleep)  12.5 mg Oral QHS   enoxaparin  70 mg Subcutaneous Q12H   feeding supplement (PROSource TF)  45 mL Per Tube BID   ferrous sulfate  300 mg Per Tube QODAY   folic acid  1 mg Intravenous Daily   hydrALAZINE  25 mg Per Tube Q8H   mouth rinse  15 mL Mouth Rinse q12n4p   metoCLOPramide (REGLAN) injection  5 mg Intravenous Q6H   pantoprazole (PROTONIX) IV  40 mg Intravenous Q12H   prenatal vitamin w/FE, FA  1 tablet Oral Q1200   scopolamine  1 patch Transdermal Q72H   sodium chloride flush  10-40 mL Intracatheter Q12H   sodium chloride flush  3 mL Intravenous Q12H   vitamin B-6  12.5 mg Oral QHS   Continuous Infusions:  sodium chloride Stopped (12/19/20 1700)   feeding supplement (OSMOLITE 1.5 CAL) 1,000 mL (12/30/20 0640)   insulin 1 Units/hr (12/30/20 1012)   lactated ringers 10 mL/hr at 12/25/20 1957   promethazine (PHENERGAN)25mg  in 81mL NS 25 mg (12/28/20 0006)   thiamine injection 500 mg (12/29/20 1416)   PRN Meds: sodium chloride, acetaminophen **OR** acetaminophen, dextrose, guaiFENesin-dextromethorphan, loperamide, ondansetron, phenol, prochlorperazine, promethazine (PHENERGAN)25mg  in 94mL NS, sodium chloride flush, sodium chloride flush   Vital Signs    Vitals:   12/30/20 0415 12/30/20 0451 12/30/20 0501 12/30/20 0806  BP: 134/62 (!) 133/58  (!) 120/51  Pulse: 91 89  90  Resp: 16   16  Temp: 97.8 F (36.6 C) 98 F (36.7 C)  98 F (36.7 C)  TempSrc: Axillary Oral  Oral  SpO2: 94% 95%  95%  Weight:   68.6 kg   Height:        Intake/Output Summary (Last  24 hours) at 12/30/2020 1013 Last data filed at 12/30/2020 0918 Gross per 24 hour  Intake 377.64 ml  Output 1225 ml  Net -847.36 ml   Last 3 Weights 12/30/2020 12/29/2020 12/28/2020  Weight (lbs) 151 lb 3.2 oz 151 lb 4.8 oz 153 lb 11.2 oz  Weight (kg) 68.584 kg 68.629 kg 69.718 kg      Telemetry    NSR - Personally Reviewed  ECG    No new tracing - Personally Reviewed  Physical Exam  Pale GEN: No acute distress.   Neck: No JVD Cardiac: RRR, no murmurs, rubs, S4 gallop is  present Respiratory: Clear to auscultation bilaterally. GI: Soft, nontender, non-distended  MS: symmetrical 1+ pedal edema; No deformity. Neuro:  Nonfocal  Psych: Normal affect   Labs    High Sensitivity Troponin:   Recent Labs  Lab 12/27/20 1322 12/27/20 1550  TROPONINIHS 8 8      Chemistry Recent Labs  Lab 12/28/20 0658 12/29/20 0220 12/30/20 0400  NA 142 141 141  K 4.5 4.3 4.4  CL 108 107 110  CO2 26 26 25   GLUCOSE 113* 118* 99  BUN 30* 33* 35*  CREATININE 1.34* 1.41* 1.32*  CALCIUM 8.7* 8.5* 8.5*  GFRNONAA 55* 52* 56*  ANIONGAP 8 8 6      Hematology Recent Labs  Lab 12/27/20 0500  12/28/20 0658 12/30/20 0400  WBC 8.6 6.7 5.3  RBC 2.86* 2.78* 2.63*  HGB 8.3* 8.3* 7.6*  HCT 26.6* 25.5* 24.5*  MCV 93.0 91.7 93.2  MCH 29.0 29.9 28.9  MCHC 31.2 32.5 31.0  RDW 15.6* 15.5 15.7*  PLT 211 195 181    BNP Recent Labs  Lab 12/27/20 1322  BNP 929.6*     DDimer No results for input(s): DDIMER in the last 168 hours.   Radiology    [place] MFM OB LIMITED  Result Date: 12/30/2020 ----------------------------------------------------------------------  OBSTETRICS REPORT                       (Signed Final 12/30/2020 08:16 am) ---------------------------------------------------------------------- Patient Info  ID #:       [id number]                          D.O.B.:  08/17/1990 (29 yrs)  Name:       Ashley Camacho [facility]                 Visit Date: 12/29/2020 06:06 pm  ---------------------------------------------------------------------- Performed By  Attending:        Noralee Space MD        Ref. Address:     [facility]                                                             [facility]                                                             [address]                                                             [address]                                                             [address]                                                             [place]                                                             [address]  Performed  By:     Hurman Horn          [facility]  Referred By:      Linzie Collin MD ---------------------------------------------------------------------- Orders  #  Description                           Code        Ordered By  1  [place] MFM OB LIMITED                     22025.42    Jaynie Collins ----------------------------------------------------------------------  #  Order #                     Accession #                Episode #  1  [id number]                   [id number]                 [id number] ---------------------------------------------------------------------- Indications  [redacted] weeks gestation of pregnancy                Z3A.27  Polyhydramnios, second trimester,              O40.2XX0  antepartum condition or complication, fetus  unspecified  Pre-existing diabetes, type 1, in pregnancy,   O24.012  second trimester  Hyperemesis gravidarum                         O21.0  Anemia during pregnancy in second trimester    O99.012  Abnormal fetal ultrasound (? liver             O28.9  calcification) ---------------------------------------------------------------------- Fetal Evaluation  Num Of Fetuses:         1  Fetal Heart Rate(bpm):  148  Cardiac Activity:       Observed  Presentation:           Cephalic  Placenta:                Posterior  P. Cord Insertion:      Visualized  Amniotic Fluid  AFI FV:      Polyhydramnios                              Largest Pocket(cm)                              10.3  Comment:    Stomach, bladder, and diaphragm seen. ---------------------------------------------------------------------- Gestational Age  Best:          27w 0d     Det. ByMarcella Dubs         EDD:   03/30/21                                      (  08/27/20) ---------------------------------------------------------------------- Anatomy  Abdomen:               Echogenic Focus ---------------------------------------------------------------------- Cervix Uterus Adnexa  Cervix  Length:           3.87  cm.  Normal appearance by transabdominal scan.  Uterus  No abnormality visualized.  Right Ovary  Not visualized.  Left Ovary  Not visualized.  Adnexa  No abnormality visualized. ---------------------------------------------------------------------- Impression  Patient is admitted for control of diabetes.  A limited ultrasound study was performed .Mild  polyhydramnios is seen. A solitary echogenic focus in the  liver consistent with intrahepatic calcification is seen. Patient  had cell-free fetal DNA screening that showed low risks for  fetal aneuploidies. Fetal anatomical survey performed at 20  weeks was reported as normal. In the absence of associated  anomalies, the risk of chromosomal defects is not increased.  Will counsel the patient on the finding at our next visit. ---------------------------------------------------------------------- Recommendations  Follow-up limited scan next week. ----------------------------------------------------------------------                  Noralee Space, MD Electronically Signed Final Report   12/30/2020 08:16 am ----------------------------------------------------------------------   Cardiac Studies   ECHO 12/26/2020     1. A small pericardial effusion is present. The pericardial effusion is   posterior to the left ventricle. There is no evidence of cardiac  tamponade.   2. Left ventricular ejection fraction, by estimation, is 50 to 55%. The  left ventricle has low normal function.   3. Right ventricular systolic function is normal. The right ventricular  size is normal. There is normal pulmonary artery systolic pressure. The  estimated right ventricular systolic pressure is 34.4 mmHg.   4. The mitral valve is grossly normal. Mild to moderate mitral valve  regurgitation.   5. Tricuspid valve regurgitation is moderate.   6. The aortic valve is normal in structure. Aortic valve regurgitation is  not visualized.   7. The inferior vena cava is normal in size with <50% respiratory  variability, suggesting right atrial pressure of 8 mmHg.   Comparison(s): A prior study was performed on 12/17/20. Prior images  reviewed side by side. LVEF has improved slightly. No change in  pericardial effusion.    Patient Profile     30 y.o. 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 BP is OK. No diuretic today. Will have to allow for normal pregnancy-related 0.5-1.0 lb weight gain per week. Check BNP periodically (weekly and when uncertain re: volume status).  Improved LVEF. Etiology of CMP remains uncertain (nutritional?).      For questions or updates, please contact [facility] Please consult [email or web address] for contact info under        Signed, Thurmon Fair, MD  12/30/2020, 10:13 AM

## 2020-12-31 DIAGNOSIS — Z3A27 27 weeks gestation of pregnancy: Secondary | ICD-10-CM

## 2020-12-31 DIAGNOSIS — O99413 Diseases of the circulatory system complicating pregnancy, third trimester: Secondary | ICD-10-CM

## 2020-12-31 LAB — GLUCOSE, CAPILLARY
Glucose-Capillary: 105 mg/dL — ABNORMAL HIGH (ref 70–99)
Glucose-Capillary: 119 mg/dL — ABNORMAL HIGH (ref 70–99)
Glucose-Capillary: 120 mg/dL — ABNORMAL HIGH (ref 70–99)
Glucose-Capillary: 132 mg/dL — ABNORMAL HIGH (ref 70–99)
Glucose-Capillary: 88 mg/dL (ref 70–99)

## 2020-12-31 LAB — BASIC METABOLIC PANEL
Anion gap: 6 (ref 5–15)
BUN: 34 mg/dL — ABNORMAL HIGH (ref 6–20)
CO2: 24 mmol/L (ref 22–32)
Calcium: 8.3 mg/dL — ABNORMAL LOW (ref 8.9–10.3)
Chloride: 112 mmol/L — ABNORMAL HIGH (ref 98–111)
Creatinine, Ser: 1.25 mg/dL — ABNORMAL HIGH (ref 0.44–1.00)
GFR, Estimated: 60 mL/min — ABNORMAL LOW (ref >60)
Glucose, Bld: 90 mg/dL (ref 70–99)
Potassium: 4.5 mmol/L (ref 3.5–5.1)
Sodium: 142 mmol/L (ref 135–145)

## 2020-12-31 NOTE — Consult Note (Signed)
   [facility] Inpatient Consult   12/31/2020  Ashley Camacho [birth year] [id number]  Managed Medicaid: [employer]  Referral request was received from Inpatient [facility] RNCM and assigned this patient for the [facility] Managed Medicaid team for review.  Plan:  Will follow hospital stay for transition of care and disposition, if she is eligible for [facility] MM program for support, disease management, chronic care needs.  For questions, please contact:  Charlesetta Shanks, RN BSN CCM [facility]  [phone] business mobile phone Toll free office [phone]  Fax number: [phone] [email or web address]

## 2020-12-31 NOTE — Progress Notes (Signed)
Patient ID: Ashley Camacho, female   DOB: 1990/08/29, 30 y.o.   MRN: [id number] [facility]ULT[facility] PRACTICE ANTEPARTUM COMPREHENSIVE PROGRESS NOTE  Ashley SUMPTION is a 30 y.o. G2P0101 at [redacted]w[redacted]d  who is admitted to IM service for management of medical complications in setting of second trimester pregnancy.  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  Fetal presentation is cephalic on U/S 12/29/20  Length of Stay:  15  Days  Subjective: No obstetric issues or complaints this morning. Patient reports good fetal movement.  She reports no uterine contractions, no bleeding and no loss of fluid per vagina.  Vitals:  Blood pressure 132/60, pulse 89, temperature 97.9 F (36.6 C), temperature source Oral, resp. rate 18, height 5\' 1"  (1.549 m), weight 68 kg, SpO2 98 %. Physical Examination: Gen NAD Lungs normal breath sounds Heart RRR Abd soft, NT, gravid Ext 1-2 + edema  Fetal Monitoring:  Baseline: 140 bpm, Variability: Moderate {6-25 bpm), Accelerations: Reactive, and Decelerations: rare variable >> Category 1   Labs:  Results for orders placed or performed during the Camacho encounter of 12/16/20 (from the past 24 hour(s))  Glucose, capillary   Collection Time: 12/30/20 12:09 PM  Result Value Ref Range   Glucose-Capillary 117 (H) 70 - 99 mg/dL  Glucose, capillary   Collection Time: 12/30/20  4:14 PM  Result Value Ref Range   Glucose-Capillary 102 (H) 70 - 99 mg/dL   Comment 1 Notify RN    Comment 2 Document in Chart   Glucose, capillary   Collection Time: 12/31/20 12:33 AM  Result Value Ref Range   Glucose-Capillary 132 (H) 70 - 99 mg/dL   Comment 1 Document in Chart   Glucose, capillary   Collection Time: 12/31/20  3:43 AM  Result Value Ref Range   Glucose-Capillary 105 (H) 70 - 99 mg/dL  Basic metabolic panel   Collection Time: 12/31/20  8:15 AM  Result  Value Ref Range   Sodium 142 135 - 145 mmol/L   Potassium 4.5 3.5 - 5.1 mmol/L   Chloride 112 (H) 98 - 111 mmol/L   CO2 24 22 - 32 mmol/L   Glucose, Bld 90 70 - 99 mg/dL   BUN 34 (H) 6 - 20 mg/dL   Creatinine, Ser 9.75 (H) 0.44 - 1.00 mg/dL   Calcium 8.3 (L) 8.9 - 10.3 mg/dL   GFR, Estimated 60 (L) >60 mL/min   Anion gap 6 5 - 15    Imaging Studies:    US Abdomen Limited  Result Date: 12/26/2020 CLINICAL DATA:  Palpable mass of left lower abdomen, pain EXAM: ULTRASOUND ABDOMEN LIMITED COMPARISON:  None. FINDINGS: Targeted ultrasound examination of the patient identified area of pain and palpable abnormality in the left lower quadrant reveals a multilobulated cystic lesion measuring 1.2 x 1.0 x 1.2 cm in the subcutaneous fat. IMPRESSION: Multiloculated cystic lesion in the subcutaneous fat of the left lower abdomen. This may reflect a small abscess if clinically referable signs and symptoms are patent. Electronically Signed   By: Lauralyn Primes M.D.   On: 12/26/2020 15:02   DG Chest Port 1 View  Result Date: 12/27/2020 CLINICAL DATA:  Respiratory distress. EXAM: PORTABLE CHEST 1 VIEW COMPARISON:  12/23/2020 FINDINGS: Cardiomegaly with pulmonary vascular congestion and slightly increased interstitial opacities/edema noted. Slightly increasing pleural effusions noted, small to moderate on the RIGHT and trace to small on the LEFT. Bibasilar  opacities are increased, favor atelectasis/edema. A LEFT PICC line with tip overlying SUPERIOR cavoatrial junction and small bore feeding tube entering the stomach with tip off the field of view noted. There is no evidence of pneumothorax. IMPRESSION: 1. Cardiomegaly with pulmonary vascular congestion and slightly increased interstitial opacities/edema. 2. Slightly increasing pleural effusions, small to moderate on the RIGHT and trace to small on the LEFT. 3. Increased bibasilar opacities, favor atelectasis or edema. Electronically Signed   By: Harmon Pier M.D.   On:  12/27/2020 13:41   DG CHEST PORT 1 VIEW  Result Date: 12/23/2020 CLINICAL DATA:  Edema.  Pregnancy. EXAM: PORTABLE CHEST 1 VIEW COMPARISON:  12/17/2020. FINDINGS: Interim placement of feeding tube, tip appears to be over the duodenum. Left PICC line in stable position. Heart size normal. Bilateral pulmonary infiltrates/edema, slightly improved from prior exam. Moderate right pleural effusion again noted without interim change. Shielding is noted over the abdomen. IMPRESSION: 1. Interim placement of feeding tube, tip appears to be over the duodenum. Left PICC line in stable position. 2. Bilateral pulmonary infiltrates/edema slightly improved from prior exam. Moderate right pleural effusion again noted without interim change. Electronically Signed   By: Maisie Fus  Register   On: 12/23/2020 11:23   DG CHEST PORT 1 VIEW  Result Date: 12/17/2020 CLINICAL DATA:  Radiograph 12/13/2020 EXAM: PORTABLE CHEST 1 VIEW COMPARISON:  CT 11/22/2020 FINDINGS: Perihilar and basilar predominant hazy and patchy opacities with pulmonary vascular congestion and cuffing most consistent with pulmonary edema. Layering bilateral pleural effusions including fluid tracking in the right fissures. No pneumothorax. Cardiac size is similar to comparison prior. Left upper extremity PICC tip terminates at the right atrium. Telemetry leads overlie the chest. Edematous changes seen in the chest wall soft tissues. No acute or worrisome osseous abnormality. IMPRESSION: Findings most compatible with pulmonary edema and developing bilateral effusions. Passive basilar atelectasis is likely present as well. Electronically Signed   By: Kreg Shropshire M.D.   On: 12/17/2020 01:59   DG Chest Port 1 View  Result Date: 12/13/2020 CLINICAL DATA:  Difficulty breathing. EXAM: PORTABLE CHEST 1 VIEW COMPARISON:  November 24, 2020. FINDINGS: The heart size and mediastinal contours are within normal limits. Left lung is clear. Mild right basilar opacity is noted  concerning for atelectasis and possible associated effusion. No pneumothorax is noted. The visualized skeletal structures are unremarkable. IMPRESSION: Mild right basilar opacity is noted concerning for atelectasis and possible associated pleural effusion. Electronically Signed   By: Lupita Raider M.D.   On: 12/13/2020 11:22   DG Abd Portable 1V  Result Date: 12/17/2020 CLINICAL DATA:  Feeding tube placement EXAM: PORTABLE ABDOMEN - 1 VIEW COMPARISON:  None. FINDINGS: Feeding tube is transpyloric with the tip in the distal duodenum near the ligament of Treitz. IMPRESSION: Feeding tube tip in the distal duodenum. Electronically Signed   By: Charlett Nose M.D.   On: 12/17/2020 15:25   ECHOCARDIOGRAM COMPLETE  Result Date: 12/17/2020    ECHOCARDIOGRAM REPORT   Patient Name:   Ashley Camacho Date of Exam: 12/17/2020 Medical Rec #:  [id number]       Height:       61.0 in Accession #:    [id number]      Weight:       125.7 lb Date of Birth:  12-17-1990      BSA:          1.550 m Patient Age:    66 years  BP:           122/66 mmHg Patient Gender: F               HR:           103 bpm. Exam Location:  Inpatient Procedure: 2D Echo, 3D Echo, Color Doppler, Cardiac Doppler and Strain Analysis Indications:    I50.21 Acute systolic (congestive) heart failure  History:        Patient has prior history of Echocardiogram examinations, most                 recent 11/25/2020. Risk Factors:Hypertension and Diabetes. Prior                 performed at [facility].  Sonographer:    Irving Burton Senior RDCS Referring Phys: [id number] ANASTASSIA DOUTOVA  Sonographer Comments: [redacted] weeks pregnant at time of study. IMPRESSIONS  1. Left ventricular ejection fraction, by estimation, is 40 to 45%. The left ventricle has mildly decreased function. The left ventricle demonstrates global hypokinesis. Left ventricular diastolic parameters are indeterminate.  2. Right ventricular systolic function is normal. The right ventricular size is mildly enlarged.  There is normal pulmonary artery systolic pressure. The estimated right ventricular systolic pressure is 29.2 mmHg.  3. Right atrial size was mildly dilated.  4. A small pericardial effusion is present.  5. The mitral valve is normal in structure. Mild to moderate mitral valve regurgitation.  6. The aortic valve is tricuspid. Aortic valve regurgitation is not visualized. No aortic stenosis is present.  7. The inferior vena cava is normal in size with greater than 50% respiratory variability, suggesting right atrial pressure of 3 mmHg. FINDINGS  Left Ventricle: Left ventricular ejection fraction, by estimation, is 40 to 45%. The left ventricle has mildly decreased function. The left ventricle demonstrates global hypokinesis. The left ventricular internal cavity size was normal in size. There is  no left ventricular hypertrophy. Left ventricular diastolic parameters are indeterminate. Right Ventricle: The right ventricular size is mildly enlarged. No increase in right ventricular wall thickness. Right ventricular systolic function is normal. There is normal pulmonary artery systolic pressure. The tricuspid regurgitant velocity is 2.56  m/s, and with an assumed right atrial pressure of 3 mmHg, the estimated right ventricular systolic pressure is 29.2 mmHg. Left Atrium: Left atrial size was normal in size. Right Atrium: Right atrial size was mildly dilated. Pericardium: A small pericardial effusion is present. Mitral Valve: The mitral valve is normal in structure. Mild to moderate mitral valve regurgitation. Tricuspid Valve: The tricuspid valve is normal in structure. Tricuspid valve regurgitation is mild. Aortic Valve: The aortic valve is tricuspid. Aortic valve regurgitation is not visualized. No aortic stenosis is present. Pulmonic Valve: The pulmonic valve was grossly normal. Pulmonic valve regurgitation is trivial. Aorta: The aortic root and ascending aorta are structurally normal, with no evidence of dilitation.  Venous: The inferior vena cava is normal in size with greater than 50% respiratory variability, suggesting right atrial pressure of 3 mmHg. IAS/Shunts: The interatrial septum was not well visualized.  LEFT VENTRICLE PLAX 2D LVIDd:         4.10 cm  Diastology LVIDs:         3.50 cm  LV e' medial:    8.70 cm/s LV PW:         1.00 cm  LV E/e' medial:  16.7 LV IVS:        0.90 cm  LV e' lateral:   11.30 cm/s LVOT diam:  1.60 cm  LV E/e' lateral: 12.8 LV SV:         50 LV SV Index:   33 LVOT Area:     2.01 cm  RIGHT VENTRICLE RV S prime:     10.60 cm/s TAPSE (M-mode): 1.6 cm LEFT ATRIUM             Index       RIGHT ATRIUM           Index LA diam:        4.00 cm 2.58 cm/m  RA Area:     17.40 cm LA Vol (A2C):   48.2 ml 31.09 ml/m RA Volume:   49.00 ml  31.61 ml/m LA Vol (A4C):   35.2 ml 22.71 ml/m LA Biplane Vol: 44.4 ml 28.64 ml/m  AORTIC VALVE LVOT Vmax:   123.00 cm/s LVOT Vmean:  93.400 cm/s LVOT VTI:    0.251 m  AORTA Ao Root diam: 2.50 cm Ao Asc diam:  2.70 cm MITRAL VALVE                TRICUSPID VALVE MV Area (PHT): 4.80 cm     TR Peak grad:   26.2 mmHg MV Decel Time: 158 msec     TR Vmax:        256.00 cm/s MV E velocity: 145.00 cm/s MV A velocity: 45.80 cm/s   SHUNTS MV E/A ratio:  3.17         Systemic VTI:  0.25 m                             Systemic Diam: 1.60 cm Epifanio Lesches MD Electronically signed by Epifanio Lesches MD Signature Date/Time: 12/17/2020/10:41:24 AM    Final    VAS [place] UPPER EXTREMITY VENOUS DUPLEX  Result Date: 12/20/2020 UPPER VENOUS STUDY  Patient Name:  JARIYAH HACKLEY  Date of Exam:   12/20/2020 Medical Rec #: [id number]        Accession #:    [id number] Date of Birth: [birth year]       Patient Gender: F Patient Age:   31Y Exam Location:  [facility] Procedure:      VAS [place] UPPER EXTREMITY VENOUS DUPLEX Referring Phys: [id number] DAWOOD S ELGERGAWY --------------------------------------------------------------------------------  Indications: F/U DVT in right brachial  found at [facility] 12/07/20 Anticoagulation: Eliquis. Comparison Study: No prior study at [facility]. Prior right UEV done at [facility] 12/07/20 Performing Technologist: Sherren Kerns RVS  Examination Guidelines: A complete evaluation includes B-mode imaging, spectral Doppler, color Doppler, and power Doppler as needed of all accessible portions of each vessel. Bilateral testing is considered an integral part of a complete examination. Limited examinations for reoccurring indications may be performed as noted.  Right Findings: +----------+------------+---------+-----------+----------+---------------+ RIGHT     CompressiblePhasicitySpontaneousProperties    Summary     +----------+------------+---------+-----------+----------+---------------+ IJV           Full       Yes       Yes                              +----------+------------+---------+-----------+----------+---------------+ Subclavian               Yes       Yes                              +----------+------------+---------+-----------+----------+---------------+  Axillary                 Yes       Yes                              +----------+------------+---------+-----------+----------+---------------+ Brachial      None       No        No                   Chronic     +----------+------------+---------+-----------+----------+---------------+ Radial        Full                                                  +----------+------------+---------+-----------+----------+---------------+ Ulnar                                               patent by color +----------+------------+---------+-----------+----------+---------------+ Cephalic    Partial                                     Chronic     +----------+------------+---------+-----------+----------+---------------+ Basilic       Full                                                  +----------+------------+---------+-----------+----------+---------------+  Left  Findings: +----------+------------+---------+-----------+----------+-------+ LEFT      CompressiblePhasicitySpontaneousPropertiesSummary +----------+------------+---------+-----------+----------+-------+ Subclavian               Yes       Yes                      +----------+------------+---------+-----------+----------+-------+  Summary:  Right: Findings consistent with acute superficial vein thrombosis involving the right cephalic vein. 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  Left: No evidence of thrombosis in the subclavian.  *See table(s) above for measurements and observations.  Diagnosing physician: Waverly Ferrari MD Electronically signed by Waverly Ferrari MD on 12/20/2020 at 6:45:02 PM.    Final    ECHOCARDIOGRAM LIMITED  Result Date: 12/28/2020    ECHOCARDIOGRAM LIMITED REPORT   Patient Name:   Ashley Camacho Date of Exam: 12/28/2020 Medical Rec #:  [id number]       Height:       61.0 in Accession #:    [id number]      Weight:       153.7 lb Date of Birth:  17-Mar-1991      BSA:          1.689 m Patient Age:    29 years        BP:           122/49 mmHg Patient Gender: F               HR:           97 bpm. Exam Location:  Inpatient Procedure: Cardiac Doppler, Color Doppler and Limited Echo Indications:  Pericardial effusion                 CHF-Acute systolic  History:        Patient has prior history of Echocardiogram examinations, most                 recent 12/17/2020. Risk Factors:Hypertension and Diabetes.  Sonographer:    Ross Ludwig RDCS (AE) Referring Phys: [id number] Parke Poisson  Sonographer Comments: [redacted] weeks pregnant at time of study IMPRESSIONS  1. A small pericardial effusion is present. The pericardial effusion is posterior to the left ventricle. There is no evidence of cardiac tamponade.  2. Left ventricular ejection fraction, by estimation, is 50 to 55%. The left ventricle has low normal function.  3. Right  ventricular systolic function is normal. The right ventricular size is normal. There is normal pulmonary artery systolic pressure. The estimated right ventricular systolic pressure is 34.4 mmHg.  4. The mitral valve is grossly normal. Mild to moderate mitral valve regurgitation.  5. Tricuspid valve regurgitation is moderate.  6. The aortic valve is normal in structure. Aortic valve regurgitation is not visualized.  7. The inferior vena cava is normal in size with <50% respiratory variability, suggesting right atrial pressure of 8 mmHg. Comparison(s): A prior study was performed on 12/17/20. Prior images reviewed side by side. LVEF has improved slightly. No change in pericardial effusion. FINDINGS  Left Ventricle: Left ventricular ejection fraction, by estimation, is 50 to 55%. The left ventricle has low normal function. Right Ventricle: The right ventricular size is normal. Right ventricular systolic function is normal. There is normal pulmonary artery systolic pressure. The tricuspid regurgitant velocity is 2.57 m/s, and with an assumed right atrial pressure of 8 mmHg,  the estimated right ventricular systolic pressure is 34.4 mmHg. Pericardium: A small pericardial effusion is present. The pericardial effusion is posterior to the left ventricle. There is no evidence of cardiac tamponade. Mitral Valve: The mitral valve is grossly normal. Mild to moderate mitral valve regurgitation. Tricuspid Valve: The tricuspid valve is grossly normal. Tricuspid valve regurgitation is moderate. Aortic Valve: The aortic valve is normal in structure. Aortic valve regurgitation is not visualized. Pulmonic Valve: Pulmonic valve regurgitation is trivial. Venous: The inferior vena cava is normal in size with less than 50% respiratory variability, suggesting right atrial pressure of 8 mmHg. LEFT VENTRICLE PLAX 2D LVOT diam:     1.60 cm LVOT Area:     2.01 cm  IVC IVC diam: 1.70 cm  AORTA Ao Asc diam: 2.60 cm TRICUSPID VALVE TR Peak grad:    26.4 mmHg TR Vmax:        257.00 cm/s  SHUNTS Systemic Diam: 1.60 cm Weston Brass MD Electronically signed by Weston Brass MD Signature Date/Time: 12/28/2020/10:16:52 AM    Final    [place] EKG SITE RITE  Result Date: 12/17/2020 If Site Rite image not attached, placement could not be confirmed due to current cardiac rhythm.  [place] EKG SITE RITE  Result Date: 12/13/2020 If [facility] image not attached, placement could not be confirmed due to current cardiac rhythm.  [place] MFM OB FOLLOW UP  Result Date: 12/22/2020 ----------------------------------------------------------------------  OBSTETRICS REPORT                       (Signed Final 12/22/2020 06:39 pm) ---------------------------------------------------------------------- Patient Info  ID #:       [id number]  D.O.B.:  January 04, 1991 (29 yrs)  Name:       Ashley [facility]acho [facility]                 Visit Date: 12/22/2020 08:54 am ---------------------------------------------------------------------- Performed By  Attending:        Ma Rings MD         Ref. Address:     [facility]                                                             [facility]                                                             [address]                                                             [address]                                                             [address]                                                             [place]                                                             [address]  Performed By:     Sandi Mealy        [facility]  Referred By:      Linzie Collin MD ---------------------------------------------------------------------- Orders  #  Description                           Code        Ordered By  1  [place]  MFM OB FOLLOW UP                   16109.60    Mariel Aloe  ----------------------------------------------------------------------  #  Order #                     Accession #                Episode #  1  [id number]                   [id number]                 [id number] ---------------------------------------------------------------------- Indications  Pre-existing diabetes, type 1, in pregnancy,   O24.012  second trimester  Anemia during pregnancy in second trimester    O99.012  [redacted] weeks gestation of pregnancy                Z3A.26  Encounter for other antenatal screening        Z36.2  follow-up ---------------------------------------------------------------------- Fetal Evaluation  Num Of Fetuses:         1  Fetal Heart Rate(bpm):  132  Cardiac Activity:       Observed  Presentation:           Breech  Placenta:               Posterior  P. Cord Insertion:      Visualized  Amniotic Fluid  AFI FV:      Polyhydramnios                              Largest Pocket(cm)                              8.5 ---------------------------------------------------------------------- Biometry  BPD:      66.4  mm     G. Age:  26w 5d         67  %    CI:        77.48   %    70 - 86                                                          FL/HC:      18.4   %    18.6 - 20.4  HC:      238.8  mm     G. Age:  26w 0d         24  %    HC/AC:      1.01        1.04 - 1.22  AC:      235.9  mm     G. Age:  27w 6d         91  %    FL/BPD:     66.3   %    71 - 87  FL:         44  mm     G. Age:  24w 3d          5  %    FL/AC:      18.7   %    20 - 24  HUM:      43.1  mm     G. Age:  25w 5d         40  %  Est. FW:     940  gm      2 lb 1 oz     59  % ---------------------------------------------------------------------- Gestational Age  U/S Today:     26w 2d                                        EDD:   03/28/21  Best:          Altamese Cabal 0d     Det. ByMarcella Dubs         EDD:   03/30/21                                      (08/27/20) ----------------------------------------------------------------------  Anatomy  Cranium:               Appears normal         Aortic Arch:            Previously seen  Cavum:                 Previously seen        Ductal Arch:            Previously seen  Ventricles:            Appears normal         Diaphragm:              Appears normal  Choroid Plexus:        Previously seen        Stomach:                Appears normal, left                                                                        sided  Cerebellum:            Previously seen        Abdomen:                Previously seen  Posterior Fossa:       Previously seen        Abdominal Wall:         Previously seen  Nuchal Fold:           Previously seen        Cord Vessels:           Previously seen  Lips:                  Previously seen        Kidneys:                Previously seen  Thoracic:              Appears normal         Bladder:  Appears normal  Heart:                 Previously seen        Spine:                  Previously seen  RVOT:                  Previously seen        Upper Extremities:      Previously seen  LVOT:                  Previously seen        Lower Extremities:      Previously seen ---------------------------------------------------------------------- Cervix Uterus Adnexa  Cervix  Length:           4.74  cm.  Normal appearance by transabdominal scan. ---------------------------------------------------------------------- Comments  This patient has been hospitalized due to complications  related to diabetes.  The fetal growth appears appropriate for her gestational age.  Mild polyhydramnios with a maximal vertical pocket of greater  than 8 cm was noted today.  Should have another growth ultrasound performed in 4  weeks. ----------------------------------------------------------------------                   Ma RingsVictor Fang, MD Electronically Signed Final Report   12/22/2020 06:39 pm ----------------------------------------------------------------------  US MFM OB LIMITED  Result Date:  12/30/2020 ----------------------------------------------------------------------  OBSTETRICS REPORT                       (Signed Final 12/30/2020 08:16 am) ---------------------------------------------------------------------- Patient Info  ID #:       [id number]                          D.O.B.:  [birth year] (29 yrs)  [facility]       [facility]                 Visit Date: 12/29/2020 06:06 pm ---------------------------------------------------------------------- Performed By  Attending:        Noralee Spaceavi Shankar MD        Ref. Address:     [facility]                                                             [facility]                                                             [address]                                                             [address]  [address]                                                             [place]                                                             [address]  Performed By:     Hurman Horn          [facility]  Referred By:      Linzie Collin MD ---------------------------------------------------------------------- Orders  #  Description                           Code        Ordered By  1  [place] MFM OB LIMITED                     [id number]    Jaynie Collins ----------------------------------------------------------------------  #  Order #                     Accession #                Episode #  1  [id number]                   [id number]                 [id number] ---------------------------------------------------------------------- Indications  [redacted] weeks gestation of pregnancy                Z3A.27  Polyhydramnios, second trimester,              O40.2XX0  antepartum condition or complication, fetus  unspecified  Pre-existing diabetes, type 1, in pregnancy,   O24.012  second trimester  Hyperemesis  gravidarum                         O21.0  Anemia during pregnancy in second trimester    O99.012  Abnormal fetal ultrasound (? liver             O28.9  calcification) ---------------------------------------------------------------------- Fetal Evaluation  Num Of Fetuses:         1  Fetal Heart Rate(bpm):  148  Cardiac Activity:       Observed  Presentation:           Cephalic  Placenta:               Posterior  P. Cord Insertion:      Visualized  Amniotic Fluid  AFI FV:      Polyhydramnios                              Largest Pocket(cm)                              10.3  Comment:    Stomach, bladder, and diaphragm seen. ---------------------------------------------------------------------- Gestational Age  Best:          27w 0d     Det. ByMarcella Dubs         EDD:   03/30/21                                      (08/27/20) ---------------------------------------------------------------------- Anatomy  Abdomen:               Echogenic Focus ---------------------------------------------------------------------- Cervix Uterus Adnexa  Cervix  Length:           3.87  cm.  Normal appearance by transabdominal scan.  Uterus  No abnormality visualized.  Right Ovary  Not visualized.  Left Ovary  Not visualized.  Adnexa  No abnormality visualized. ---------------------------------------------------------------------- Impression  Patient is admitted for control of diabetes.  A limited ultrasound study was performed .Mild  polyhydramnios is seen. A solitary echogenic focus in the  liver consistent with intrahepatic calcification is seen. Patient  had cell-free fetal DNA screening that showed low risks for  fetal aneuploidies. Fetal anatomical survey performed at 20  weeks was reported as normal. In the absence of associated  anomalies, the risk of chromosomal defects is not increased.  Will counsel the patient on the finding at our next visit. ----------------------------------------------------------------------  Recommendations  Follow-up limited scan next week. ----------------------------------------------------------------------                  Noralee Space, MD Electronically Signed Final Report   12/30/2020 08:16 am ----------------------------------------------------------------------    Medications:  Scheduled  Chlorhexidine Gluconate Cloth  6 each Topical Daily   doxylamine (Sleep)  12.5 mg Oral QHS   enoxaparin  70 mg Subcutaneous Q12H   feeding supplement (PROSource TF)  45 mL Per Tube BID   ferrous sulfate  300 mg Per Tube QODAY   folic acid  1 mg Intravenous Daily   hydrALAZINE  25 mg Per Tube Q8H   mouth rinse  15 mL Mouth Rinse q12n4p   metoCLOPramide (REGLAN) injection  5 mg Intravenous Q6H   pantoprazole (PROTONIX) IV  40 mg Intravenous Q12H   prenatal vitamin w/FE, FA  1 tablet Oral Q1200   scopolamine  1 patch Transdermal Q72H   sodium chloride flush  10-40 mL Intracatheter Q12H   sodium chloride flush  3 mL Intravenous Q12H   thiamine injection  100 mg Intravenous Daily   vitamin B-6  12.5 mg Oral QHS   I have reviewed the patient's current medications.  ASSESSMENT & Plan  IUP [redacted]w[redacted]d Multiple medical complications, managed by [facility] team  Stable from OB stand point.  MFM consult on chart. Stable ultrasound. They will see again this week and make recommendations on timing of delivery and other OB care.  Fetal well being reassuring at present. Appreciate IM and Cardiology care for pt.   Jaynie Collins, MD 12/31/2020,11:33 AM

## 2020-12-31 NOTE — Care Management Note (Signed)
Case Management Note  Patient Details  Name: Ashley Camacho MRN: [id number] Date of Birth: 1990-07-02  Subjective/Objective:                  Patient is a 30 year old female G2,P1 at [redacted] weeks gestation who has a history of type 1 diabetes mellitus.  She presented to the emergency room for evaluation of persistent nausea and vomiting .  She was recently with multiple hospitalization, at [facility] for sepsis secondary to pneumonia and was there for 2 weeks.  Couple hospitalization at [facility] due to intractable nausea and vomiting  In-House Referral:  [facility]  Discharge planning Services  CM Consult   Additional Comments: CM followed up with patient today in room and talked to her.  Patient awake, cooperative but experiencing some nausea.  Patient informed CM that she lives with significant other - Ashley Camacho ( he works nights) and 35 year old daughter in [place], [place].  Father of patient is deceased and patient informed CM that she does not talk to her sister and mother.  Patient does not drive or work currently and Sondra Come - father of the baby, gets patient to appointments as needed.  Patient has insurance with no barriers for medications needs.  Patient is followed up with endocrinologist in [facility].  Patient denied any needs today.  Did discuss if patient does need any home health services at any point patient does not have preference of agency she informed CM. CM spoke to [facility] attending and for any updates and no needs for patient at this time.  CM made [facility] - referral to hospital liaison Charlesetta Shanks RN for post discharge follow up and support.   Updated Pam with [facility] and she is following.  CM will continue to follow for discharge needs.    Gretchen Short RNC-MNN, BSN Transitions of Care [facility]  12/31/2020, 1:53 PM

## 2020-12-31 NOTE — Progress Notes (Signed)
I agree with the telemetry assessment for box # T-01.

## 2020-12-31 NOTE — Progress Notes (Signed)
PROGRESS NOTE    Ashley Camacho  SNK:[id number] DOB: 12-24-1990 DOA: 12/16/2020 PCP: Hildred Laser, MD   Chief Complain:Nausea,vomiting  Brief Narrative:  Patient is a 30 year old female G2,P1 at [redacted] weeks gestation who has a history of type 1 diabetes mellitus.  She presented to the emergency room for evaluation of persistent nausea and vomiting .  She was recently with multiple hospitalization, at [facility] for sepsis secondary to pneumonia and was there for 2 weeks.  Couple hospitalization at [facility] due to intractable nausea and vomiting. -Transferred from [facility] to [facility] 7/26 given her severe malnutrition, with hyperemesis gravidarum, for consideration of cortrak tube insertion.  Patient had core track inserted on admission, so far tolerating tube feed (she remains with significant nausea and vomiting, but this is at baseline and did not worsen with initiation of tube feed), as well her work-up was significant for acute CHF, requiring diuresis, cardiology following. We are giving trial of clear liquid diet .  Continues to have persistent nausea and vomiting so not tolerating that much.  Goal is to start on oral diet with advancement and removal of feeding tube.  Assessment & Plan:   Active Problems:   Anemia affecting pregnancy in second trimester   AKI (acute kidney injury) (HCC)   Hyperemesis   Pregnancy with 24 completed weeks gestation   DKA, type 1, not at goal [facility])   Starvation ketoacidosis   Acute deep vein thrombosis (DVT) of brachial vein of right upper extremity (HCC)   Acute respiratory failure with hypoxia (HCC)   Vitreous hemorrhage of right eye (HCC)   Gastroparesis due to DM (HCC)   Diabetic retinopathy (HCC)   Acute on chronic combined systolic and diastolic CHF (congestive heart failure) (HCC)   Type 1 diabetes mellitus affecting pregnancy in second trimester, antepartum   HFrEF (heart failure with reduced ejection fraction) (HCC)   Intractable  nausea/vomiting, severe protein calorie malnutrition, hyperemesis gravidarum/underlying gastroparesis: Initially presented with persistent nausea and vomiting.  Was on TPN previously which was discontinued on 7/28.  Now being fed through tube.  Tolerating tube feeding.    Was previously started on a steroid for hyperemesis gravidarum but it was discontinued because it did not help.  Currently on Reglan, Zofran, scopolamine, Protonix, Compazine, Phenergan. We are giving trial of clear liquid diet .  Continues to have persistent nausea and vomiting. Goal is to remove the tube feeding at some point.  Type 1 diabetes: Uncontrolled with hyperglycemia alternating with hyperglycemia.  On insulin drip.  Follows with endocrinologist and takes insulin at home.Daibetic coordinator were following,.  Cardiomyopathy/acute systolic congestive heart failure: Currently on hydralazine, as needed Lasix. Has severe bilateral lower extremity edema.  Echo done on 12/28/2020 showed ejection fraction of 50 to 55%, small pericardial effusion.  Echo done on 7/22 showed ejection fraction of 40 to 45%.  Cardiology following.  Lasix on hold due to AKI. Bilateral lower extremity edema could have been contributed due to pregnancy, hypoalbuminemia.  Right upper extremity DVT: On Lovenox  AKI on CKD stage II: Hospital course remarkable for AKI, diuresis on hold.  Continue to monitor.Improving  Normocytic anemia: Most likely  dilutional secondary to pregnancy hypervolemia.  Continue to monitor  Second trimester pregnancy: 27 weeks.  OB-GYN following.  Maternal-fetal medicine consulted for timing of delivery.      Nutrition Problem: Inadequate oral intake Etiology: nausea, vomiting      DVT prophylaxis:Lovenox Code Status: Full Family Communication: Boyfriend at the bedside on 8.8.22  Status is: Inpatient  Remains inpatient appropriate because:Inpatient level of care appropriate due to severity of illness  Dispo: The  patient is from: Home              Anticipated d/c is to: Home              Patient currently is not medically stable to d/c.   Difficult to place patient No     Consultants: Cardiology, GYN  Procedures: None  Antimicrobials:  Anti-infectives (From admission, onward)    None       Subjective: Patient seen and examined at the bedside this morning.  Hemodynamically stable during my evaluation.  He still having persistent nausea.  She looks little better today.  She has not been that  able to tolerate the clear liquid diet but trying to drink some water.  Objective: Vitals:   12/30/20 1939 12/30/20 1940 12/30/20 2338 12/31/20 0533  BP: 136/60  (!) 139/59 138/60  Pulse: 94  94 88  Resp: 16  18 18   Temp: 98.5 F (36.9 C)  98.2 F (36.8 C) 97.7 F (36.5 C)  TempSrc: Oral  Oral Oral  SpO2: 94% 95% 98% 95%  Weight:    68 kg  Height:        Intake/Output Summary (Last 24 hours) at 12/31/2020 0734 Last data filed at 12/31/2020 0532 Gross per 24 hour  Intake 1848.52 ml  Output 1576 ml  Net 272.52 ml   Filed Weights   12/29/20 0500 12/30/20 0501 12/31/20 0533  Weight: 68.6 kg 68.6 kg 68 kg    Examination:  General exam: Weak, not in distress HEENT: PERRL, feeding tube Respiratory system:  no wheezes or crackles  Cardiovascular system: S1 & S2 heard, RRR.  Gastrointestinal system: Abdomen is nondistended, soft and nontender. Central nervous system: Alert and oriented Extremities: 2/3+ bilateral lower extremity pitting edema, no clubbing ,no cyanosis Skin: No rashes, no ulcers,no icterus     Data Reviewed: I have personally reviewed following labs and imaging studies  CBC: Recent Labs  Lab 12/25/20 0640 12/26/20 0330 12/27/20 0500 12/28/20 0658 12/30/20 0400  WBC 8.0 7.6 8.6 6.7 5.3  NEUTROABS  --   --   --   --  3.7  HGB 8.1* 7.2* 8.3* 8.3* 7.6*  HCT 25.4* 23.2* 26.6* 25.5* 24.5*  MCV 91.4 92.4 93.0 91.7 93.2  PLT 237 235 211 195 181   Basic Metabolic  Panel: Recent Labs  Lab 12/26/20 0330 12/27/20 0500 12/28/20 0658 12/29/20 0220 12/30/20 0400  NA 141 141 142 141 141  K 4.4 4.4 4.5 4.3 4.4  CL 106 108 108 107 110  CO2 29 27 26 26 25   GLUCOSE 120* 91 113* 118* 99  BUN 27* 27* 30* 33* 35*  CREATININE 1.31* 1.36* 1.34* 1.41* 1.32*  CALCIUM 8.4* 8.6* 8.7* 8.5* 8.5*  MG  --   --   --  2.4  --    GFR: Estimated Creatinine Clearance: 55.5 mL/min (A) (by C-G formula based on SCr of 1.32 mg/dL (H)). Liver Function Tests: No results for input(s): AST, ALT, ALKPHOS, BILITOT, PROT, ALBUMIN in the last 168 hours. No results for input(s): LIPASE, AMYLASE in the last 168 hours. No results for input(s): AMMONIA in the last 168 hours. Coagulation Profile: No results for input(s): INR, PROTIME in the last 168 hours. Cardiac Enzymes: No results for input(s): CKTOTAL, CKMB, CKMBINDEX, TROPONINI in the last 168 hours. BNP (last 3 results) No results for input(s): PROBNP  in the last 8760 hours. HbA1C: No results for input(s): HGBA1C in the last 72 hours. CBG: Recent Labs  Lab 12/30/20 0808 12/30/20 1209 12/30/20 1614 12/31/20 0033 12/31/20 0343  GLUCAP 90 117* 102* 132* 105*   Lipid Profile: No results for input(s): CHOL, HDL, LDLCALC, TRIG, CHOLHDL, LDLDIRECT in the last 72 hours. Thyroid Function Tests: No results for input(s): TSH, T4TOTAL, FREET4, T3FREE, THYROIDAB in the last 72 hours. Anemia Panel: No results for input(s): VITAMINB12, FOLATE, FERRITIN, TIBC, IRON, RETICCTPCT in the last 72 hours. Sepsis Labs: No results for input(s): PROCALCITON, LATICACIDVEN in the last 168 hours.  No results found for this or any previous visit (from the past 240 hour(s)).        Radiology Studies: [place] MFM OB LIMITED  Result Date: 12/30/2020 ----------------------------------------------------------------------  OBSTETRICS REPORT                       (Signed Final 12/30/2020 08:16 am)  ---------------------------------------------------------------------- Patient Info  ID #:       [id number]                          D.O.B.:  May 03, 1991 (29 yrs)  Name:       Ashley Camacho                 Visit Date: 12/29/2020 06:06 pm ---------------------------------------------------------------------- Performed By  Attending:        Noralee Space MD        Ref. Address:     [facility]                                                             [facility]                                                             [address]                                                             [address]                                                             [address]                                                             [place]  [address]  Performed By:     Hurman Horn          Location[facility]  Referred By:      Linzie Collin MD ---------------------------------------------------------------------- Orders  #  Description                           Code        Ordered By  1  [place] MFM OB LIMITED                     [id number]    Jaynie Collins ----------------------------------------------------------------------  #  Order #                     Accession #                Episode #  1  [id number]                   [id number]                 [id number] ---------------------------------------------------------------------- Indications  [redacted] weeks gestation of pregnancy                Z3A.27  Polyhydramnios, second trimester,              O40.2XX0  antepartum condition or complication, fetus  unspecified  Pre-existing diabetes, type 1, in pregnancy,   O24.012  second trimester  Hyperemesis gravidarum                         O21.0  Anemia during pregnancy in second trimester    O99.012  Abnormal fetal ultrasound (? liver             O28.9  calcification)  ---------------------------------------------------------------------- Fetal Evaluation  Num Of Fetuses:         1  Fetal Heart Rate(bpm):  148  Cardiac Activity:       Observed  Presentation:           Cephalic  Placenta:               Posterior  P. Cord Insertion:      Visualized  Amniotic Fluid  AFI FV:      Polyhydramnios                              Largest Pocket(cm)                              10.3  Comment:    Stomach, bladder, and diaphragm seen. ---------------------------------------------------------------------- Gestational Age  Best:          27w 0d     Det. ByMarcella Dubs         EDD:   03/30/21                                      (  08/27/20) ---------------------------------------------------------------------- Anatomy  Abdomen:               Echogenic Focus ---------------------------------------------------------------------- Cervix Uterus Adnexa  Cervix  Length:           3.87  cm.  Normal appearance by transabdominal scan.  Uterus  No abnormality visualized.  Right Ovary  Not visualized.  Left Ovary  Not visualized.  Adnexa  No abnormality visualized. ---------------------------------------------------------------------- Impression  Patient is admitted for control of diabetes.  A limited ultrasound study was performed .Mild  polyhydramnios is seen. A solitary echogenic focus in the  liver consistent with intrahepatic calcification is seen. Patient  had cell-free fetal DNA screening that showed low risks for  fetal aneuploidies. Fetal anatomical survey performed at 20  weeks was reported as normal. In the absence of associated  anomalies, the risk of chromosomal defects is not increased.  Will counsel the patient on the finding at our next visit. ---------------------------------------------------------------------- Recommendations  Follow-up limited scan next week. ----------------------------------------------------------------------                  Noralee Space, MD Electronically  Signed Final Report   12/30/2020 08:16 am ----------------------------------------------------------------------       Scheduled Meds:  bacitracin   Topical BID   Chlorhexidine Gluconate Cloth  6 each Topical Daily   doxylamine (Sleep)  12.5 mg Oral QHS   enoxaparin  70 mg Subcutaneous Q12H   feeding supplement (PROSource TF)  45 mL Per Tube BID   ferrous sulfate  300 mg Per Tube QODAY   folic acid  1 mg Intravenous Daily   hydrALAZINE  25 mg Per Tube Q8H   mouth rinse  15 mL Mouth Rinse q12n4p   metoCLOPramide (REGLAN) injection  5 mg Intravenous Q6H   pantoprazole (PROTONIX) IV  40 mg Intravenous Q12H   prenatal vitamin w/FE, FA  1 tablet Oral Q1200   scopolamine  1 patch Transdermal Q72H   sodium chloride flush  10-40 mL Intracatheter Q12H   sodium chloride flush  3 mL Intravenous Q12H   thiamine injection  100 mg Intravenous Daily   vitamin B-6  12.5 mg Oral QHS   Continuous Infusions:  sodium chloride Stopped (12/19/20 1700)   feeding supplement (OSMOLITE 1.5 CAL) 1,000 mL (12/31/20 0350)   insulin 0.7 Units/hr (12/31/20 0548)   lactated ringers 10 mL/hr at 12/31/20 0603   promethazine (PHENERGAN)25mg  in 57mL NS 25 mg (12/28/20 0006)     LOS: 15 days    Time spent:25 mins. More than 50% of that time was spent in counseling and/or coordination of care.      Burnadette Pop, MD [facility] P8/02/2021, 7:34 AM

## 2020-12-31 NOTE — Consult Note (Signed)
MFM Note  Ashley Camacho is a gravida 2 para 1 currently at 27 weeks and 2 days.  She has been admitted due to complications related to type 1 diabetes, hypertension, malnutrition and chronic systolic and diastolic heart failure.  She remains on tube feeds.  She is being followed by medicine and cardiology.  Her serum creatinine levels have been improving over the last few days.  Her fetal status has been reassuring  She had a growth ultrasound performed on August 1 showing an EFW of 2 pounds 1 ounces (59th percentile).  Polyhydramnios was noted during that exam.    A limited ultrasound performed 2 days ago continues to show polyhydramnios with a maximal vertical pocket of 10.3 cm.  An intrahepatic calcification was noted in the fetal liver. The significance of this finding remains undetermined.The patient was advised that we will reassess this finding again during her future ultrasound exams.  The patient's obstetrical history is significant for a prior 29-week emergent cesarean delivery at [facility] due to a nonreassuring fetal heart rate tracing.  Due to the patient's significant medical issues and complications, the goal for delivery would be at around 34 weeks.  She will most likely require a repeat cesarean delivery.   She understands that due to her complicated medical issues, she is at increased risk for developing preeclampsia.  Delivery prior to 34 weeks may be necessary should she develop signs or symptoms of severe preeclampsia or if her PIH labs become abnormal.  As she is a brittle diabetic, a course of antenatal corticosteroids should not be given prior to delivery.  She should continue daily fetal testing while hospitalized.    Weekly biophysical profiles should be performed starting at 28 weeks (next week).    She should continue to have growth ultrasounds every 4 weeks.  Recommendations:  Daily fetal testing Start the BPP's at 28 weeks Monthly growth  ultrasounds Delivery at 34 weeks or earlier should she develop severe preeclampsia Antenatal corticosteroids are not recommended as she is a brittle diabetic

## 2020-12-31 NOTE — Progress Notes (Signed)
Progress Note  Patient Name: Ashley Camacho Date of Encounter: 12/31/2020  [facility] Cardiologist: None   Subjective   Denies any problems with shortness of breath.  Remains quite weak.  Still has some pedal edema and 2+ pretibial pitting edema.  No vomiting today.  Trying to start to take clear fluids by mouth. Weight is actually down about a pound since yesterday. Creatinine has improved.  Inpatient Medications    Scheduled Meds:  Chlorhexidine Gluconate Cloth  6 each Topical Daily   doxylamine (Sleep)  12.5 mg Oral QHS   enoxaparin  70 mg Subcutaneous Q12H   feeding supplement (PROSource TF)  45 mL Per Tube BID   ferrous sulfate  300 mg Per Tube QODAY   folic acid  1 mg Intravenous Daily   hydrALAZINE  25 mg Per Tube Q8H   mouth rinse  15 mL Mouth Rinse q12n4p   metoCLOPramide (REGLAN) injection  5 mg Intravenous Q6H   pantoprazole (PROTONIX) IV  40 mg Intravenous Q12H   prenatal vitamin w/FE, FA  1 tablet Oral Q1200   scopolamine  1 patch Transdermal Q72H   sodium chloride flush  10-40 mL Intracatheter Q12H   sodium chloride flush  3 mL Intravenous Q12H   thiamine injection  100 mg Intravenous Daily   vitamin B-6  12.5 mg Oral QHS   Continuous Infusions:  sodium chloride Stopped (12/19/20 1700)   feeding supplement (OSMOLITE 1.5 CAL) 1,000 mL (12/31/20 0350)   insulin 1.5 Units/hr (12/31/20 1046)   lactated ringers 10 mL/hr at 12/31/20 0603   promethazine (PHENERGAN)25mg  in 35mL NS 25 mg (12/28/20 0006)   PRN Meds: sodium chloride, acetaminophen **OR** acetaminophen, dextrose, guaiFENesin-dextromethorphan, loperamide, ondansetron, phenol, prochlorperazine, promethazine (PHENERGAN)25mg  in 43mL NS, sodium chloride flush, sodium chloride flush   Vital Signs    Vitals:   12/30/20 1940 12/30/20 2338 12/31/20 0533 12/31/20 0748  BP:  (!) 139/59 138/60 132/60  Pulse:  94 88 89  Resp:  18 18 18   Temp:  98.2 F (36.8 C) 97.7 F (36.5 C) 97.9 F (36.6 C)   TempSrc:  Oral Oral Oral  SpO2: 95% 98% 95% 98%  Weight:   68 kg   Height:        Intake/Output Summary (Last 24 hours) at 12/31/2020 1059 Last data filed at 12/31/2020 0532 Gross per 24 hour  Intake 1748.52 ml  Output 1376 ml  Net 372.52 ml   Last 3 Weights 12/31/2020 12/30/2020 12/29/2020  Weight (lbs) 150 lb 151 lb 3.2 oz 151 lb 4.8 oz  Weight (kg) 68.04 kg 68.584 kg 68.629 kg      Telemetry    Sinus rhythm- Personally Reviewed  ECG    No new tracing- Personally Reviewed  Physical Exam  Pale.  Weak. GEN: No acute distress.   Neck: No JVD Cardiac: RRR, no murmurs, rubs, or gallop is present.   Respiratory: Clear to auscultation bilaterally. GI: Soft, nontender, non-distended  MS: 2+ pitting symmetrical pretibial edema; No deformity. Neuro:  Nonfocal  Psych: Very quiet  Labs    High Sensitivity Troponin:   Recent Labs  Lab 12/27/20 1322 12/27/20 1550  TROPONINIHS 8 8      Chemistry Recent Labs  Lab 12/29/20 0220 12/30/20 0400 12/31/20 0815  NA 141 141 142  K 4.3 4.4 4.5  CL 107 110 112*  CO2 26 25 24   GLUCOSE 118* 99 90  BUN 33* 35* 34*  CREATININE 1.41* 1.32* 1.25*  CALCIUM 8.5* 8.5* 8.3*  GFRNONAA 52* 56* 60*  ANIONGAP 8 6 6      Hematology Recent Labs  Lab 12/27/20 0500 12/28/20 0658 12/30/20 0400  WBC 8.6 6.7 5.3  RBC 2.86* 2.78* 2.63*  HGB 8.3* 8.3* 7.6*  HCT 26.6* 25.5* 24.5*  MCV 93.0 91.7 93.2  MCH 29.0 29.9 28.9  MCHC 31.2 32.5 31.0  RDW 15.6* 15.5 15.7*  PLT 211 195 181    BNP Recent Labs  Lab 12/27/20 1322  BNP 929.6*     DDimer No results for input(s): DDIMER in the last 168 hours.   Radiology    02/26/21 MFM OB LIMITED  Result Date: 12/30/2020 ----------------------------------------------------------------------  OBSTETRICS REPORT                       (Signed Final 12/30/2020 08:16 am) ---------------------------------------------------------------------- Patient Info  ID #:       03/01/2021                           D.O.B.:  04-23-1991 (29 yrs)  Name:       Ashley Camacho [facility]                 Visit Date: 12/29/2020 06:06 pm ---------------------------------------------------------------------- Performed By  Attending:        02/28/2021 MD        Ref. Address:     [facility]                                                             [facility]                                                             [address]                                                             [address]                                                             [address]                                                             [place]  [address]  Performed By:     Hurman Horn          [facility]  Referred By:      Linzie Collin MD ---------------------------------------------------------------------- Orders  #  Description                           Code        Ordered By  1  [place] MFM OB LIMITED                     [id number]    Jaynie Collins ----------------------------------------------------------------------  #  Order #                     Accession #                Episode #  1  [id number]                   [id number]                 [id number] ---------------------------------------------------------------------- Indications  [redacted] weeks gestation of pregnancy                Z3A.27  Polyhydramnios, second trimester,              O40.2XX0  antepartum condition or complication, fetus  unspecified  Pre-existing diabetes, type 1, in pregnancy,   O24.012  second trimester  Hyperemesis gravidarum                         O21.0  Anemia during pregnancy in second trimester    O99.012  Abnormal fetal ultrasound (? liver             O28.9  calcification) ---------------------------------------------------------------------- Fetal Evaluation  Num Of Fetuses:         1  Fetal Heart  Rate(bpm):  148  Cardiac Activity:       Observed  Presentation:           Cephalic  Placenta:               Posterior  P. Cord Insertion:      Visualized  Amniotic Fluid  AFI FV:      Polyhydramnios                              Largest Pocket(cm)                              10.3  Comment:    Stomach, bladder, and diaphragm seen. ---------------------------------------------------------------------- Gestational Age  Best:          27w 0d     Det. ByMarcella Dubs         EDD:   03/30/21                                      (  08/27/20) ---------------------------------------------------------------------- Anatomy  Abdomen:               Echogenic Focus ---------------------------------------------------------------------- Cervix Uterus Adnexa  Cervix  Length:           3.87  cm.  Normal appearance by transabdominal scan.  Uterus  No abnormality visualized.  Right Ovary  Not visualized.  Left Ovary  Not visualized.  Adnexa  No abnormality visualized. ---------------------------------------------------------------------- Impression  Patient is admitted for control of diabetes.  A limited ultrasound study was performed .Mild  polyhydramnios is seen. A solitary echogenic focus in the  liver consistent with intrahepatic calcification is seen. Patient  had cell-free fetal DNA screening that showed low risks for  fetal aneuploidies. Fetal anatomical survey performed at 20  weeks was reported as normal. In the absence of associated  anomalies, the risk of chromosomal defects is not increased.  Will counsel the patient on the finding at our next visit. ---------------------------------------------------------------------- Recommendations  Follow-up limited scan next week. ----------------------------------------------------------------------                  Noralee Space, MD Electronically Signed Final Report   12/30/2020 08:16 am ----------------------------------------------------------------------   Cardiac Studies    ECHO 12/26/2020     1. A small pericardial effusion is present. The pericardial effusion is  posterior to the left ventricle. There is no evidence of cardiac  tamponade.   2. Left ventricular ejection fraction, by estimation, is 50 to 55%. The  left ventricle has low normal function.   3. Right ventricular systolic function is normal. The right ventricular  size is normal. There is normal pulmonary artery systolic pressure. The  estimated right ventricular systolic pressure is 34.4 mmHg.   4. The mitral valve is grossly normal. Mild to moderate mitral valve  regurgitation.   5. Tricuspid valve regurgitation is moderate.   6. The aortic valve is normal in structure. Aortic valve regurgitation is  not visualized.   7. The inferior vena cava is normal in size with <50% respiratory  variability, suggesting right atrial pressure of 8 mmHg.   Comparison(s): A prior study was performed on 12/17/20. Prior images  reviewed side by side. LVEF has improved slightly. No change in  pericardial effusion.    Patient Profile     30 y.o. 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  We will hold off diuretics 1 more day.  We will check BNP once more at the end of the week.     For questions or updates, please contact [facility] Please consult [email or web address] for contact info under        Signed, Thurmon Fair, MD  12/31/2020, 10:59 AM

## 2021-01-01 LAB — CBC
HCT: 25.5 % — ABNORMAL LOW (ref 36.0–46.0)
Hemoglobin: 8.1 g/dL — ABNORMAL LOW (ref 12.0–15.0)
MCH: 29.5 pg (ref 26.0–34.0)
MCHC: 31.8 g/dL (ref 30.0–36.0)
MCV: 92.7 fL (ref 80.0–100.0)
Platelets: 174 10*3/uL (ref 150–400)
RBC: 2.75 MIL/uL — ABNORMAL LOW (ref 3.87–5.11)
RDW: 15.5 % (ref 11.5–15.5)
WBC: 4.7 10*3/uL (ref 4.0–10.5)
nRBC: 0 % (ref 0.0–0.2)

## 2021-01-01 LAB — COMPREHENSIVE METABOLIC PANEL
ALT: 18 U/L (ref 0–44)
AST: 24 U/L (ref 15–41)
Albumin: 2.9 g/dL — ABNORMAL LOW (ref 3.5–5.0)
Alkaline Phosphatase: 153 U/L — ABNORMAL HIGH (ref 38–126)
Anion gap: 6 (ref 5–15)
BUN: 35 mg/dL — ABNORMAL HIGH (ref 6–20)
CO2: 23 mmol/L (ref 22–32)
Calcium: 8.4 mg/dL — ABNORMAL LOW (ref 8.9–10.3)
Chloride: 112 mmol/L — ABNORMAL HIGH (ref 98–111)
Creatinine, Ser: 1.26 mg/dL — ABNORMAL HIGH (ref 0.44–1.00)
GFR, Estimated: 59 mL/min — ABNORMAL LOW (ref >60)
Glucose, Bld: 88 mg/dL (ref 70–99)
Potassium: 4.8 mmol/L (ref 3.5–5.1)
Sodium: 141 mmol/L (ref 135–145)
Total Bilirubin: 0.8 mg/dL (ref 0.3–1.2)
Total Protein: 5.8 g/dL — ABNORMAL LOW (ref 6.5–8.1)

## 2021-01-01 LAB — GLUCOSE, CAPILLARY
Glucose-Capillary: 99 mg/dL (ref 70–99)
Glucose-Capillary: 102 mg/dL — ABNORMAL HIGH (ref 70–99)

## 2021-01-01 MED ORDER — DIPHENHYDRAMINE-ZINC ACETATE 2-0.1 % EX CREA
TOPICAL_CREAM | Freq: Every day | CUTANEOUS | Status: DC | PRN
  Filled 2021-01-01 (×2): qty 28

## 2021-01-01 MED ORDER — HYDROCORTISONE 1 % EX CREA
TOPICAL_CREAM | CUTANEOUS | Status: DC | PRN
  Filled 2021-01-01 (×3): qty 28

## 2021-01-01 NOTE — Progress Notes (Signed)
PROGRESS NOTE    Ashley Camacho  EHU:[id number] DOB: Apr 11, 1991 DOA: 12/16/2020 PCP: Hildred Laser, MD   Chief Complain:Nausea,vomiting  Brief Narrative:  Patient is a 30 year old female G2,P1 at [redacted] weeks gestation who has a history of type 1 diabetes mellitus.  She presented to the emergency room for evaluation of persistent nausea and vomiting .  She was recently with multiple hospitalization, at [facility] for sepsis secondary to pneumonia and was there for 2 weeks.  Couple hospitalization at [facility] due to intractable nausea and vomiting. -Transferred from [facility] to [facility] 7/26 given her severe malnutrition, with hyperemesis gravidarum, for consideration of cortrak tube insertion.  Patient had core track inserted on admission, so far tolerating tube feed (she remains with significant nausea and vomiting, but this is at baseline and did not worsen with initiation of tube feed), as well her work-up was significant for acute CHF, requiring diuresis, cardiology following. We are giving trial of clear liquid diet .  Continues to have persistent nausea and vomiting so not tolerating that much.  Goal is to start on oral diet with advancement and removal of feeding tube.  Assessment & Plan:   Active Problems:   Anemia affecting pregnancy in second trimester   AKI (acute kidney injury) (HCC)   Hyperemesis   Pregnancy with 24 completed weeks gestation   DKA, type 1, not at goal [facility])   Starvation ketoacidosis   Acute deep vein thrombosis (DVT) of brachial vein of right upper extremity (HCC)   Acute respiratory failure with hypoxia (HCC)   Vitreous hemorrhage of right eye (HCC)   Gastroparesis due to DM (HCC)   Diabetic retinopathy (HCC)   Acute on chronic combined systolic and diastolic CHF (congestive heart failure) (HCC)   Type 1 diabetes mellitus affecting pregnancy in second trimester, antepartum   HFrEF (heart failure with reduced ejection fraction) (HCC)   [redacted] weeks  gestation of pregnancy   Intractable nausea/vomiting, severe protein calorie malnutrition, hyperemesis gravidarum/underlying gastroparesis: Initially presented with persistent nausea and vomiting.  Was on TPN previously which was discontinued on 7/28.  Now being fed through tube.  Tolerating tube feeding.    Was previously started on a steroid for hyperemesis gravidarum but it was discontinued because it did not help.  Currently on Reglan, Zofran, scopolamine, Protonix, Compazine, Phenergan. We are giving trial of clear liquid diet .  Continues to have persistent nausea and vomiting. Goal is to remove the tube feeding at some point.  Type 1 diabetes: Uncontrolled with hyperglycemia alternating with hyperglycemia.  On insulin drip.  Follows with endocrinologist and takes insulin at home.Diabetic coordinator were following,.  Cardiomyopathy/acute systolic congestive heart failure: Currently on hydralazine, as needed Lasix. Has severe bilateral lower extremity edema.  Echo done on 12/28/2020 showed ejection fraction of 50 to 55%, small pericardial effusion.  Echo done on 7/22 showed ejection fraction of 40 to 45%.  Cardiology following.  Lasix on hold due to AKI. Bilateral lower extremity edema could have been contributed due to pregnancy, hypoalbuminemia. Cardiology planning for limited echo next week.  Checking BMP and BNP tomorrow  Right upper extremity DVT: On Lovenox  AKI on CKD stage II: Hospital course remarkable for AKI, diuresis on hold.  Continue to monitor.Improving  Normocytic anemia: Most likely  dilutional secondary to pregnancy hypervolemia.  Continue to monitor  Second trimester pregnancy: 27 weeks.  OB-GYN following.  Maternal-fetal medicine recommended daily fetal testing, monthly growth ultrasounds, delivery at 34 weeks, no antenatal corticosteroids.  Nutrition Problem: Inadequate oral intake Etiology: nausea, vomiting      DVT prophylaxis:Lovenox Code Status:  Full Family Communication: Boyfriend at the bedside on 8.8.22 Status is: Inpatient  Remains inpatient appropriate because:Inpatient level of care appropriate due to severity of illness  Dispo: The patient is from: Home              Anticipated d/c is to: Home              Patient currently is not medically stable to d/c.   Difficult to place patient No     Consultants: Cardiology, GYN  Procedures: None  Antimicrobials:  Anti-infectives (From admission, onward)    None       Subjective: Patient seen and examined the bedside this morning.  Complains of nausea.  Did not vomit this morning.  When asked, she has not been able to tolerate the clear liquid diet but trying to get some sips of water.  I discussed with RN and patient also about continue clear liquid diet as tolerated.  Objective: Vitals:   12/31/20 2010 12/31/20 2152 01/01/21 0012 01/01/21 0512  BP:  (!) 136/49 (!) 133/56 (!) 120/57  Pulse:  96 90 90  Resp:   20 20  Temp:   97.6 F (36.4 C) (!) 97.5 F (36.4 C)  TempSrc:   Oral Oral  SpO2: 95%   100%  Weight:      Height:        Intake/Output Summary (Last 24 hours) at 01/01/2021 0727 Last data filed at 01/01/2021 0706 Gross per 24 hour  Intake 2080 ml  Output 1955 ml  Net 125 ml   Filed Weights   12/29/20 0500 12/30/20 0501 12/31/20 0533  Weight: 68.6 kg 68.6 kg 68 kg    Examination:  General exam: weak,lying on bed, not in distress HEENT: PERRL Respiratory system:  no wheezes or crackles  Cardiovascular system: S1 & S2 heard, RRR.  Gastrointestinal system: Abdomen is nondistended, soft and nontender. Central nervous system: Alert and oriented Extremities: 2-3+ bilateral pitting edema, no clubbing ,no cyanosis Skin: No rashes, no ulcers,no icterus       Data Reviewed: I have personally reviewed following labs and imaging studies  CBC: Recent Labs  Lab 12/26/20 0330 12/27/20 0500 12/28/20 0658 12/30/20 0400  WBC 7.6 8.6 6.7 5.3   NEUTROABS  --   --   --  3.7  HGB 7.2* 8.3* 8.3* 7.6*  HCT 23.2* 26.6* 25.5* 24.5*  MCV 92.4 93.0 91.7 93.2  PLT 235 211 195 181   Basic Metabolic Panel: Recent Labs  Lab 12/27/20 0500 12/28/20 0658 12/29/20 0220 12/30/20 0400 12/31/20 0815  NA 141 142 141 141 142  K 4.4 4.5 4.3 4.4 4.5  CL 108 108 107 110 112*  CO2 27 26 26 25 24   GLUCOSE 91 113* 118* 99 90  BUN 27* 30* 33* 35* 34*  CREATININE 1.36* 1.34* 1.41* 1.32* 1.25*  CALCIUM 8.6* 8.7* 8.5* 8.5* 8.3*  MG  --   --  2.4  --   --    GFR: Estimated Creatinine Clearance: 58.6 mL/min (A) (by C-G formula based on SCr of 1.25 mg/dL (H)). Liver Function Tests: No results for input(s): AST, ALT, ALKPHOS, BILITOT, PROT, ALBUMIN in the last 168 hours. No results for input(s): LIPASE, AMYLASE in the last 168 hours. No results for input(s): AMMONIA in the last 168 hours. Coagulation Profile: No results for input(s): INR, PROTIME in the last 168 hours. Cardiac  Enzymes: No results for input(s): CKTOTAL, CKMB, CKMBINDEX, TROPONINI in the last 168 hours. BNP (last 3 results) No results for input(s): PROBNP in the last 8760 hours. HbA1C: No results for input(s): HGBA1C in the last 72 hours. CBG: Recent Labs  Lab 12/31/20 1148 12/31/20 1558 12/31/20 2006 01/01/21 0100 01/01/21 0506  GLUCAP 120* 119* 88 99 102*   Lipid Profile: No results for input(s): CHOL, HDL, LDLCALC, TRIG, CHOLHDL, LDLDIRECT in the last 72 hours. Thyroid Function Tests: No results for input(s): TSH, T4TOTAL, FREET4, T3FREE, THYROIDAB in the last 72 hours. Anemia Panel: No results for input(s): VITAMINB12, FOLATE, FERRITIN, TIBC, IRON, RETICCTPCT in the last 72 hours. Sepsis Labs: No results for input(s): PROCALCITON, LATICACIDVEN in the last 168 hours.  No results found for this or any previous visit (from the past 240 hour(s)).        Radiology Studies: No results found.      Scheduled Meds:  Chlorhexidine Gluconate Cloth  6 each  Topical Daily   doxylamine (Sleep)  12.5 mg Oral QHS   enoxaparin  70 mg Subcutaneous Q12H   feeding supplement (PROSource TF)  45 mL Per Tube BID   ferrous sulfate  300 mg Per Tube QODAY   folic acid  1 mg Intravenous Daily   hydrALAZINE  25 mg Per Tube Q8H   mouth rinse  15 mL Mouth Rinse q12n4p   metoCLOPramide (REGLAN) injection  5 mg Intravenous Q6H   pantoprazole (PROTONIX) IV  40 mg Intravenous Q12H   prenatal vitamin w/FE, FA  1 tablet Oral Q1200   scopolamine  1 patch Transdermal Q72H   sodium chloride flush  10-40 mL Intracatheter Q12H   sodium chloride flush  3 mL Intravenous Q12H   thiamine injection  100 mg Intravenous Daily   vitamin B-6  12.5 mg Oral QHS   Continuous Infusions:  sodium chloride Stopped (12/19/20 1700)   feeding supplement (OSMOLITE 1.5 CAL) 1,000 mL (12/31/20 1800)   insulin 0.4 Units/hr (01/01/21 0508)   lactated ringers 10 mL/hr at 12/31/20 0603   promethazine (PHENERGAN)25mg  in 71mL NS 25 mg (12/28/20 0006)     LOS: 16 days    Time spent:25 mins. More than 50% of that time was spent in counseling and/or coordination of care.      Burnadette Pop, MD [facility] P8/03/2021, 7:27 AM

## 2021-01-01 NOTE — Progress Notes (Addendum)
This chaplain is present for F/U on the Pt. desire to complete her Advance Directive.  The chaplain reviewed the chart notes and checked in with the Pt. RN-Crystal before the visit.  The Pt. is in the bedside recliner at the time of the visit.  The Pt. shares her nausea has not changed much since the chaplain's visit on 8/9.   The chaplain understands the Pt. has not talked to the person she is thinking of for the role of HCPOA.  The chaplain offered assistance as needed and informed the Pt. of the notary's schedule.    The chaplain understands the Pt. does not have other spiritual care needs at this time.

## 2021-01-01 NOTE — Progress Notes (Signed)
ANTICOAGULATION CONSULT NOTE - Follow Up Consult   Pharmacy Consult for enoxaparin Indication:  VTE treatment for right brachial DVT   No Known Allergies   Patient Measurements: Height: 5\' 1"  (154.9 cm) Weight: 69.5 kg (153 lb 3.5 oz) IBW/kg (Calculated) : 47.8 Heparin Dosing Weight: 70.2 kg   Vital Signs: Temp: 98 F (36.7 C) (08/04 1525) Temp Source: Axillary (08/04 1525) BP: 126/49 (08/04 1525) Pulse Rate: 101 (08/04 1525)   Labs: Recent Labs (last 2 labs)        Recent Labs    12/23/20 0527 12/24/20 0547 12/25/20 0640  HGB 8.4* 8.0* 8.1*  HCT 26.2* 25.4* 25.4*  PLT 261 239 237  CREATININE 1.22* 1.13* 1.24*        Estimated Creatinine Clearance: 59.7 mL/min (A) (by C-G formula based on SCr of 1.24 mg/dL (H)).     Medications:  Scheduled:   bacitracin   Topical BID   Chlorhexidine Gluconate Cloth  6 each Topical Daily   doxylamine (Sleep)  12.5 mg Oral QHS   enoxaparin  70 mg Subcutaneous Q12H   feeding supplement (PROSource TF)  45 mL Per Tube BID   ferrous sulfate  300 mg Per Tube Q breakfast   folic acid  1 mg Intravenous Daily   furosemide  20 mg Intravenous Daily   hydrALAZINE  25 mg Per Tube Q8H   mouth rinse  15 mL Mouth Rinse q12n4p   metoCLOPramide (REGLAN) injection  5 mg Intravenous Q6H   pantoprazole (PROTONIX) IV  40 mg Intravenous Q12H   prenatal multivitamin  1 tablet Per Tube Q1200   scopolamine  1 patch Transdermal Q72H   sodium chloride flush  10-40 mL Intracatheter Q12H   sodium chloride flush  3 mL Intravenous Q12H   vitamin B-6  12.5 mg Oral QHS      Assessment: 29 YOF at 27w2 currently receiving full dose (1 mg/kg BID) enoxaparin for recent right brachial DVT. Last anti-Xa level = 0.81 (8/8) within target range of 0.6-1.  Scr fluctuated over the past few days from 1.25>1.32>1.41>1.34 mg/dL. Currently returning to baseline 1.2. Weight stable. Plts: 181  No new DVT s/sx: redness, swelling, etc   Goal of Therapy:  Anti-Xa level  0.6-1 units/ml 4hrs after LMWH dose given Monitor platelets by anticoagulation protocol: Yes   Plan:  Continue Lovenox 70mg  SQ BID. Will plan to obtain another anti-Xa peak on 8/15 ~4-6 hours post dose since it has been a week since last level and fluctuating renal function.   , PharmD Candidate

## 2021-01-01 NOTE — Progress Notes (Signed)
Patient ID: Ashley Camacho, female   DOB: 12-22-1990, 30 y.o.   MRN: [id number] ACULTY PRACTICE ANTEPARTUM COMPREHENSIVE PROGRESS NOTE  Ashley Camacho is a 30 y.o. G2P0101 at [redacted]w[redacted]d  who is admitted to IM service for management of medical complications in setting of second trimester pregnancy.  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  Fetal presentation is cephalic on U/S 12/29/20  Length of Stay:  16  Days  Subjective: No obstetric issues or complaints this morning. Still having nausea and vomiting around her feeding tube. Patient reports good fetal movement.  She reports no uterine contractions, no bleeding and no loss of fluid per vagina.    Vitals:  Blood pressure (!) 154/89, pulse 90, temperature 97.8 F (36.6 C), temperature source Oral, resp. rate 18, height 5\' 1"  (1.549 m), weight 68 kg, SpO2 97 %. Patient Vitals for the past 24 hrs:  BP Temp Temp src Pulse Resp SpO2  01/01/21 1140 (!) 154/89 97.8 F (36.6 C) Oral 90 18 97 %  01/01/21 0854 (!) 142/86 97.7 F (36.5 C) Oral 89 19 100 %  01/01/21 0512 (!) 120/57 (!) 97.5 F (36.4 C) Oral 90 20 100 %  01/01/21 0012 (!) 133/56 97.6 F (36.4 C) Oral 90 20 --  12/31/20 2152 (!) 136/49 -- -- 96 -- --  12/31/20 2010 -- -- -- -- -- 95 %  12/31/20 2009 134/61 (!) 97.4 F (36.3 C) Axillary 94 18 95 %  12/31/20 1558 (!) 137/55 98 F (36.7 C) Oral 93 18 98 %    Physical Examination: Gen NAD Lungs normal breath sounds Heart RRR Abd soft, NT, gravid Ext 2 + edema  Fetal Monitoring:  Baseline: 140 bpm, Variability: Moderate {6-25 bpm), Accelerations: Reactive, and Decelerations: rare variable >> Category 1   Labs:  Results for orders placed or performed during the hospital encounter of 12/16/20 (from the past 24 hour(s))  Glucose, capillary   Collection Time: 12/31/20  3:58 PM  Result Value Ref  Range   Glucose-Capillary 119 (H) 70 - 99 mg/dL  Glucose, capillary   Collection Time: 12/31/20  8:06 PM  Result Value Ref Range   Glucose-Capillary 88 70 - 99 mg/dL  Glucose, capillary   Collection Time: 01/01/21  1:00 AM  Result Value Ref Range   Glucose-Capillary 99 70 - 99 mg/dL  Glucose, capillary   Collection Time: 01/01/21  5:06 AM  Result Value Ref Range   Glucose-Capillary 102 (H) 70 - 99 mg/dL    Imaging Studies:    US Abdomen Limited  Result Date: 12/26/2020 CLINICAL DATA:  Palpable mass of left lower abdomen, pain EXAM: ULTRASOUND ABDOMEN LIMITED COMPARISON:  None. FINDINGS: Targeted ultrasound examination of the patient identified area of pain and palpable abnormality in the left lower quadrant reveals a multilobulated cystic lesion measuring 1.2 x 1.0 x 1.2 cm in the subcutaneous fat. IMPRESSION: Multiloculated cystic lesion in the subcutaneous fat of the left lower abdomen. This may reflect a small abscess if clinically referable signs and symptoms are patent. Electronically Signed   By: Lauralyn Primes M.D.   On: 12/26/2020 15:02   DG Chest Port 1 View  Result Date: 12/27/2020 CLINICAL DATA:  Respiratory distress. EXAM: PORTABLE CHEST 1 VIEW COMPARISON:  12/23/2020 FINDINGS: Cardiomegaly with pulmonary vascular congestion and slightly increased interstitial opacities/edema noted. Slightly increasing pleural effusions noted, small to moderate on the RIGHT  and trace to small on the LEFT. Bibasilar opacities are increased, favor atelectasis/edema. A LEFT PICC line with tip overlying SUPERIOR cavoatrial junction and small bore feeding tube entering the stomach with tip off the field of view noted. There is no evidence of pneumothorax. IMPRESSION: 1. Cardiomegaly with pulmonary vascular congestion and slightly increased interstitial opacities/edema. 2. Slightly increasing pleural effusions, small to moderate on the RIGHT and trace to small on the LEFT. 3. Increased bibasilar opacities,  favor atelectasis or edema. Electronically Signed   By: Harmon Pier M.D.   On: 12/27/2020 13:41   DG CHEST PORT 1 VIEW  Result Date: 12/23/2020 CLINICAL DATA:  Edema.  Pregnancy. EXAM: PORTABLE CHEST 1 VIEW COMPARISON:  12/17/2020. FINDINGS: Interim placement of feeding tube, tip appears to be over the duodenum. Left PICC line in stable position. Heart size normal. Bilateral pulmonary infiltrates/edema, slightly improved from prior exam. Moderate right pleural effusion again noted without interim change. Shielding is noted over the abdomen. IMPRESSION: 1. Interim placement of feeding tube, tip appears to be over the duodenum. Left PICC line in stable position. 2. Bilateral pulmonary infiltrates/edema slightly improved from prior exam. Moderate right pleural effusion again noted without interim change. Electronically Signed   By: Maisie Fus  Register   On: 12/23/2020 11:23   DG CHEST PORT 1 VIEW  Result Date: 12/17/2020 CLINICAL DATA:  Radiograph 12/13/2020 EXAM: PORTABLE CHEST 1 VIEW COMPARISON:  CT 11/22/2020 FINDINGS: Perihilar and basilar predominant hazy and patchy opacities with pulmonary vascular congestion and cuffing most consistent with pulmonary edema. Layering bilateral pleural effusions including fluid tracking in the right fissures. No pneumothorax. Cardiac size is similar to comparison prior. Left upper extremity PICC tip terminates at the right atrium. Telemetry leads overlie the chest. Edematous changes seen in the chest wall soft tissues. No acute or worrisome osseous abnormality. IMPRESSION: Findings most compatible with pulmonary edema and developing bilateral effusions. Passive basilar atelectasis is likely present as well. Electronically Signed   By: Kreg Shropshire M.D.   On: 12/17/2020 01:59   DG Chest Port 1 View  Result Date: 12/13/2020 CLINICAL DATA:  Difficulty breathing. EXAM: PORTABLE CHEST 1 VIEW COMPARISON:  November 24, 2020. FINDINGS: The heart size and mediastinal contours are within  normal limits. Left lung is clear. Mild right basilar opacity is noted concerning for atelectasis and possible associated effusion. No pneumothorax is noted. The visualized skeletal structures are unremarkable. IMPRESSION: Mild right basilar opacity is noted concerning for atelectasis and possible associated pleural effusion. Electronically Signed   By: Lupita Raider M.D.   On: 12/13/2020 11:22   DG Abd Portable 1V  Result Date: 12/17/2020 CLINICAL DATA:  Feeding tube placement EXAM: PORTABLE ABDOMEN - 1 VIEW COMPARISON:  None. FINDINGS: Feeding tube is transpyloric with the tip in the distal duodenum near the ligament of Treitz. IMPRESSION: Feeding tube tip in the distal duodenum. Electronically Signed   By: Charlett Nose M.D.   On: 12/17/2020 15:25   ECHOCARDIOGRAM COMPLETE  Result Date: 12/17/2020    ECHOCARDIOGRAM REPORT   Patient Name:   Ashley Camacho Date of Exam: 12/17/2020 Medical Rec #:  [id number]       Height:       61.0 in Accession #:    [id number]      Weight:       125.7 lb Date of Birth:  1990-08-28      BSA:          1.550 m Patient Age:    70  years        BP:           122/66 mmHg Patient Gender: F               HR:           103 bpm. Exam Location:  Inpatient Procedure: 2D Echo, 3D Echo, Color Doppler, Cardiac Doppler and Strain Analysis Indications:    I50.21 Acute systolic (congestive) heart failure  History:        Patient has prior history of Echocardiogram examinations, most                 recent 11/25/2020. Risk Factors:Hypertension and Diabetes. Prior                 performed at [facility].  Sonographer:    Irving Burton Senior RDCS Referring Phys: [id number] ANASTASSIA DOUTOVA  Sonographer Comments: [redacted] weeks pregnant at time of study. IMPRESSIONS  1. Left ventricular ejection fraction, by estimation, is 40 to 45%. The left ventricle has mildly decreased function. The left ventricle demonstrates global hypokinesis. Left ventricular diastolic parameters are indeterminate.  2. Right ventricular  systolic function is normal. The right ventricular size is mildly enlarged. There is normal pulmonary artery systolic pressure. The estimated right ventricular systolic pressure is 29.2 mmHg.  3. Right atrial size was mildly dilated.  4. A small pericardial effusion is present.  5. The mitral valve is normal in structure. Mild to moderate mitral valve regurgitation.  6. The aortic valve is tricuspid. Aortic valve regurgitation is not visualized. No aortic stenosis is present.  7. The inferior vena cava is normal in size with greater than 50% respiratory variability, suggesting right atrial pressure of 3 mmHg. FINDINGS  Left Ventricle: Left ventricular ejection fraction, by estimation, is 40 to 45%. The left ventricle has mildly decreased function. The left ventricle demonstrates global hypokinesis. The left ventricular internal cavity size was normal in size. There is  no left ventricular hypertrophy. Left ventricular diastolic parameters are indeterminate. Right Ventricle: The right ventricular size is mildly enlarged. No increase in right ventricular wall thickness. Right ventricular systolic function is normal. There is normal pulmonary artery systolic pressure. The tricuspid regurgitant velocity is 2.56  m/s, and with an assumed right atrial pressure of 3 mmHg, the estimated right ventricular systolic pressure is 29.2 mmHg. Left Atrium: Left atrial size was normal in size. Right Atrium: Right atrial size was mildly dilated. Pericardium: A small pericardial effusion is present. Mitral Valve: The mitral valve is normal in structure. Mild to moderate mitral valve regurgitation. Tricuspid Valve: The tricuspid valve is normal in structure. Tricuspid valve regurgitation is mild. Aortic Valve: The aortic valve is tricuspid. Aortic valve regurgitation is not visualized. No aortic stenosis is present. Pulmonic Valve: The pulmonic valve was grossly normal. Pulmonic valve regurgitation is trivial. Aorta: The aortic root  and ascending aorta are structurally normal, with no evidence of dilitation. Venous: The inferior vena cava is normal in size with greater than 50% respiratory variability, suggesting right atrial pressure of 3 mmHg. IAS/Shunts: The interatrial septum was not well visualized.  LEFT VENTRICLE PLAX 2D LVIDd:         4.10 cm  Diastology LVIDs:         3.50 cm  LV e' medial:    8.70 cm/s LV PW:         1.00 cm  LV E/e' medial:  16.7 LV IVS:        0.90 cm  LV  e' lateral:   11.30 cm/s LVOT diam:     1.60 cm  LV E/e' lateral: 12.8 LV SV:         50 LV SV Index:   33 LVOT Area:     2.01 cm  RIGHT VENTRICLE RV S prime:     10.60 cm/s TAPSE (M-mode): 1.6 cm LEFT ATRIUM             Index       RIGHT ATRIUM           Index LA diam:        4.00 cm 2.58 cm/m  RA Area:     17.40 cm LA Vol (A2C):   48.2 ml 31.09 ml/m RA Volume:   49.00 ml  31.61 ml/m LA Vol (A4C):   35.2 ml 22.71 ml/m LA Biplane Vol: 44.4 ml 28.64 ml/m  AORTIC VALVE LVOT Vmax:   123.00 cm/s LVOT Vmean:  93.400 cm/s LVOT VTI:    0.251 m  AORTA Ao Root diam: 2.50 cm Ao Asc diam:  2.70 cm MITRAL VALVE                TRICUSPID VALVE MV Area (PHT): 4.80 cm     TR Peak grad:   26.2 mmHg MV Decel Time: 158 msec     TR Vmax:        256.00 cm/s MV E velocity: 145.00 cm/s MV A velocity: 45.80 cm/s   SHUNTS MV E/A ratio:  3.17         Systemic VTI:  0.25 m                             Systemic Diam: 1.60 cm Epifanio Lesches MD Electronically signed by Epifanio Lesches MD Signature Date/Time: 12/17/2020/10:41:24 AM    Final    VAS [place] UPPER EXTREMITY VENOUS DUPLEX  Result Date: 12/20/2020 UPPER VENOUS STUDY  Patient Name:  Ashley Camacho  Date of Exam:   12/20/2020 Medical Rec #: [id number]        Accession #:    [id number] Date of Birth: 1990-05-29       Patient Gender: F Patient Age:   52Y Exam Location:  [facility] Procedure:      VAS [place] UPPER EXTREMITY VENOUS DUPLEX Referring Phys: [id number] DAWOOD S ELGERGAWY  --------------------------------------------------------------------------------  Indications: F/U DVT in right brachial found at [facility] 12/07/20 Anticoagulation: Eliquis. Comparison Study: No prior study at [facility]. Prior right UEV done at [facility] 12/07/20 Performing Technologist: Sherren Kerns RVS  Examination Guidelines: A complete evaluation includes B-mode imaging, spectral Doppler, color Doppler, and power Doppler as needed of all accessible portions of each vessel. Bilateral testing is considered an integral part of a complete examination. Limited examinations for reoccurring indications may be performed as noted.  Right Findings: +----------+------------+---------+-----------+----------+---------------+ RIGHT     CompressiblePhasicitySpontaneousProperties    Summary     +----------+------------+---------+-----------+----------+---------------+ IJV           Full       Yes       Yes                              +----------+------------+---------+-----------+----------+---------------+ Subclavian               Yes       Yes                              +----------+------------+---------+-----------+----------+---------------+  Axillary                 Yes       Yes                              +----------+------------+---------+-----------+----------+---------------+ Brachial      None       No        No                   Chronic     +----------+------------+---------+-----------+----------+---------------+ Radial        Full                                                  +----------+------------+---------+-----------+----------+---------------+ Ulnar                                               patent by color +----------+------------+---------+-----------+----------+---------------+ Cephalic    Partial                                     Chronic     +----------+------------+---------+-----------+----------+---------------+ Basilic       Full                                                   +----------+------------+---------+-----------+----------+---------------+  Left Findings: +----------+------------+---------+-----------+----------+-------+ LEFT      CompressiblePhasicitySpontaneousPropertiesSummary +----------+------------+---------+-----------+----------+-------+ Subclavian               Yes       Yes                      +----------+------------+---------+-----------+----------+-------+  Summary:  Right: Findings consistent with acute superficial vein thrombosis involving the right cephalic vein. 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  Left: No evidence of thrombosis in the subclavian.  *See table(s) above for measurements and observations.  Diagnosing physician: Waverly Ferrari MD Electronically signed by Waverly Ferrari MD on 12/20/2020 at 6:45:02 PM.    Final    ECHOCARDIOGRAM LIMITED  Result Date: 12/28/2020    ECHOCARDIOGRAM LIMITED REPORT   Patient Name:   Ashley Camacho Date of Exam: 12/28/2020 Medical Rec #:  [id number]       Height:       61.0 in Accession #:    [id number]      Weight:       153.7 lb Date of Birth:  1990/12/08      BSA:          1.689 m Patient Age:    29 years        BP:           122/49 mmHg Patient Gender: F               HR:           97 bpm. Exam Location:  Inpatient Procedure: Cardiac Doppler, Color Doppler and Limited Echo Indications:  Pericardial effusion                 CHF-Acute systolic  History:        Patient has prior history of Echocardiogram examinations, most                 recent 12/17/2020. Risk Factors:Hypertension and Diabetes.  Sonographer:    Ross Ludwig RDCS (AE) Referring Phys: [id number] Parke Poisson  Sonographer Comments: [redacted] weeks pregnant at time of study IMPRESSIONS  1. A small pericardial effusion is present. The pericardial effusion is posterior to the left ventricle. There is no evidence of cardiac tamponade.  2. Left  ventricular ejection fraction, by estimation, is 50 to 55%. The left ventricle has low normal function.  3. Right ventricular systolic function is normal. The right ventricular size is normal. There is normal pulmonary artery systolic pressure. The estimated right ventricular systolic pressure is 34.4 mmHg.  4. The mitral valve is grossly normal. Mild to moderate mitral valve regurgitation.  5. Tricuspid valve regurgitation is moderate.  6. The aortic valve is normal in structure. Aortic valve regurgitation is not visualized.  7. The inferior vena cava is normal in size with <50% respiratory variability, suggesting right atrial pressure of 8 mmHg. Comparison(s): A prior study was performed on 12/17/20. Prior images reviewed side by side. LVEF has improved slightly. No change in pericardial effusion. FINDINGS  Left Ventricle: Left ventricular ejection fraction, by estimation, is 50 to 55%. The left ventricle has low normal function. Right Ventricle: The right ventricular size is normal. Right ventricular systolic function is normal. There is normal pulmonary artery systolic pressure. The tricuspid regurgitant velocity is 2.57 m/s, and with an assumed right atrial pressure of 8 mmHg,  the estimated right ventricular systolic pressure is 34.4 mmHg. Pericardium: A small pericardial effusion is present. The pericardial effusion is posterior to the left ventricle. There is no evidence of cardiac tamponade. Mitral Valve: The mitral valve is grossly normal. Mild to moderate mitral valve regurgitation. Tricuspid Valve: The tricuspid valve is grossly normal. Tricuspid valve regurgitation is moderate. Aortic Valve: The aortic valve is normal in structure. Aortic valve regurgitation is not visualized. Pulmonic Valve: Pulmonic valve regurgitation is trivial. Venous: The inferior vena cava is normal in size with less than 50% respiratory variability, suggesting right atrial pressure of 8 mmHg. LEFT VENTRICLE PLAX 2D LVOT diam:      1.60 cm LVOT Area:     2.01 cm  IVC IVC diam: 1.70 cm  AORTA Ao Asc diam: 2.60 cm TRICUSPID VALVE TR Peak grad:   26.4 mmHg TR Vmax:        257.00 cm/s  SHUNTS Systemic Diam: 1.60 cm Weston Brass MD Electronically signed by Weston Brass MD Signature Date/Time: 12/28/2020/10:16:52 AM    Final    [place] EKG SITE RITE  Result Date: 12/17/2020 If Site Rite image not attached, placement could not be confirmed due to current cardiac rhythm.  [place] EKG SITE RITE  Result Date: 12/13/2020 If [facility] image not attached, placement could not be confirmed due to current cardiac rhythm.  [place] MFM OB FOLLOW UP  Result Date: 12/22/2020 ----------------------------------------------------------------------  OBSTETRICS REPORT                       (Signed Final 12/22/2020 06:39 pm) ---------------------------------------------------------------------- Patient Info  ID #:       [id number]  D.O.B.:  1990/09/03 (29 yrs)  Name:       Ashley Camacho [facility]                 Visit Date: 12/22/2020 08:54 am ---------------------------------------------------------------------- Performed By  Attending:        Ma Rings MD         Ref. Address:     [facility]                                                             [facility]                                                             [address]                                                             [address]                                                             [address]                                                             [place]                                                             [address]  Performed By:     Sandi Mealy        [facility]  Referred By:      Linzie Collin MD ---------------------------------------------------------------------- Orders  #  Description                           Code        Ordered By   1  [place] MFM OB FOLLOW UP                   [id number]    Mariel Aloe ----------------------------------------------------------------------  #  Order #                     Accession #                Episode #  1  [id number]                   [id number]                 [id number] ---------------------------------------------------------------------- Indications  Pre-existing diabetes, type 1, in pregnancy,   O24.012  second trimester  Anemia during pregnancy in second trimester    O99.012  [redacted] weeks gestation of pregnancy                Z3A.26  Encounter for other antenatal screening        Z36.2  follow-up ---------------------------------------------------------------------- Fetal Evaluation  Num Of Fetuses:         1  Fetal Heart Rate(bpm):  132  Cardiac Activity:       Observed  Presentation:           Breech  Placenta:               Posterior  P. Cord Insertion:      Visualized  Amniotic Fluid  AFI FV:      Polyhydramnios                              Largest Pocket(cm)                              8.5 ---------------------------------------------------------------------- Biometry  BPD:      66.4  mm     G. Age:  26w 5d         67  %    CI:        77.48   %    70 - 86                                                          FL/HC:      18.4   %    18.6 - 20.4  HC:      238.8  mm     G. Age:  26w 0d         24  %    HC/AC:      1.01        1.04 - 1.22  AC:      235.9  mm     G. Age:  27w 6d         91  %    FL/BPD:     66.3   %    71 - 87  FL:         44  mm     G. Age:  24w 3d          5  %    FL/AC:      18.7   %    20 - 24  HUM:      43.1  mm     G. Age:  25w 5d         40  %  Est. FW:     940  gm      2 lb 1 oz     59  % ---------------------------------------------------------------------- Gestational Age  U/S Today:     26w 2d                                        EDD:   03/28/21  Best:          Altamese Cabal 0d     Det. ByMarcella Dubs         EDD:   03/30/21                                      (08/27/20)  ---------------------------------------------------------------------- Anatomy  Cranium:               Appears normal         Aortic Arch:            Previously seen  Cavum:                 Previously seen        Ductal Arch:            Previously seen  Ventricles:            Appears normal         Diaphragm:              Appears normal  Choroid Plexus:        Previously seen        Stomach:                Appears normal, left                                                                        sided  Cerebellum:            Previously seen        Abdomen:                Previously seen  Posterior Fossa:       Previously seen        Abdominal Wall:         Previously seen  Nuchal Fold:           Previously seen        Cord Vessels:           Previously seen  Lips:                  Previously seen        Kidneys:                Previously seen  Thoracic:              Appears normal         Bladder:  Appears normal  Heart:                 Previously seen        Spine:                  Previously seen  RVOT:                  Previously seen        Upper Extremities:      Previously seen  LVOT:                  Previously seen        Lower Extremities:      Previously seen ---------------------------------------------------------------------- Cervix Uterus Adnexa  Cervix  Length:           4.74  cm.  Normal appearance by transabdominal scan. ---------------------------------------------------------------------- Comments  This patient has been hospitalized due to complications  related to diabetes.  The fetal growth appears appropriate for her gestational age.  Mild polyhydramnios with a maximal vertical pocket of greater  than 8 cm was noted today.  Should have another growth ultrasound performed in 4  weeks. ----------------------------------------------------------------------                   Ma Rings, MD Electronically Signed Final Report   12/22/2020 06:39 pm  ----------------------------------------------------------------------  [place] MFM OB LIMITED  Result Date: 12/30/2020 ----------------------------------------------------------------------  OBSTETRICS REPORT                       (Signed Final 12/30/2020 08:16 am) ---------------------------------------------------------------------- Patient Info  ID #:       [id number]                          D.O.B.:  1991-05-05 (29 yrs)  Name:       Ashley Camacho [facility]                 Visit Date: 12/29/2020 06:06 pm ---------------------------------------------------------------------- Performed By  Attending:        Noralee Space MD        Ref. Address:     [facility]                                                             [facility]                                                             [address]                                                             [address]  [address]                                                             [place]                                                             [address]  Performed By:     Hurman Horn          [facility]  Referred By:      Linzie Collin MD ---------------------------------------------------------------------- Orders  #  Description                           Code        Ordered By  1  [place] MFM OB LIMITED                     [id number]    Jaynie Collins ----------------------------------------------------------------------  #  Order #                     Accession #                Episode #  1  [id number]                   [id number]                 [id number] ---------------------------------------------------------------------- Indications  [redacted] weeks gestation of pregnancy                Z3A.27  Polyhydramnios, second trimester,              O40.2XX0  antepartum condition or complication, fetus   unspecified  Pre-existing diabetes, type 1, in pregnancy,   O24.012  second trimester  Hyperemesis gravidarum                         O21.0  Anemia during pregnancy in second trimester    O99.012  Abnormal fetal ultrasound (? liver             O28.9  calcification) ---------------------------------------------------------------------- Fetal Evaluation  Num Of Fetuses:         1  Fetal Heart Rate(bpm):  148  Cardiac Activity:       Observed  Presentation:           Cephalic  Placenta:               Posterior  P. Cord Insertion:      Visualized  Amniotic Fluid  AFI FV:      Polyhydramnios                              Largest Pocket(cm)                              10.3  Comment:    Stomach, bladder, and diaphragm seen. ---------------------------------------------------------------------- Gestational Age  Best:          27w 0d     Det. ByMarcella Dubs         EDD:   03/30/21                                      (08/27/20) ---------------------------------------------------------------------- Anatomy  Abdomen:               Echogenic Focus ---------------------------------------------------------------------- Cervix Uterus Adnexa  Cervix  Length:           3.87  cm.  Normal appearance by transabdominal scan.  Uterus  No abnormality visualized.  Right Ovary  Not visualized.  Left Ovary  Not visualized.  Adnexa  No abnormality visualized. ---------------------------------------------------------------------- Impression  Patient is admitted for control of diabetes.  A limited ultrasound study was performed .Mild  polyhydramnios is seen. A solitary echogenic focus in the  liver consistent with intrahepatic calcification is seen. Patient  had cell-free fetal DNA screening that showed low risks for  fetal aneuploidies. Fetal anatomical survey performed at 20  weeks was reported as normal. In the absence of associated  anomalies, the risk of chromosomal defects is not increased.  Will counsel the patient on the finding  at our next visit. ---------------------------------------------------------------------- Recommendations  Follow-up limited scan next week. ----------------------------------------------------------------------                  Noralee Space, MD Electronically Signed Final Report   12/30/2020 08:16 am ----------------------------------------------------------------------    Medications:  Scheduled  Chlorhexidine Gluconate Cloth  6 each Topical Daily   doxylamine (Sleep)  12.5 mg Oral QHS   enoxaparin  70 mg Subcutaneous Q12H   feeding supplement (PROSource TF)  45 mL Per Tube BID   ferrous sulfate  300 mg Per Tube QODAY   folic acid  1 mg Intravenous Daily   hydrALAZINE  25 mg Per Tube Q8H   mouth rinse  15 mL Mouth Rinse q12n4p   metoCLOPramide (REGLAN) injection  5 mg Intravenous Q6H   pantoprazole (PROTONIX) IV  40 mg Intravenous Q12H   prenatal vitamin w/FE, FA  1 tablet Oral Q1200   scopolamine  1 patch Transdermal Q72H   sodium chloride flush  10-40 mL Intracatheter Q12H   sodium chloride flush  3 mL Intravenous Q12H   thiamine injection  100 mg Intravenous Daily   vitamin B-6  12.5 mg Oral QHS   I have reviewed the patient's current medications.  ASSESSMENT & Plan  IUP [redacted]w[redacted]d Multiple medical complications, managed by [facility] team  Concerned about her HTN and development of preeclampsia, will check labs today (CMET and CBC ordered), patient denied any severe symptoms.  Otherwise, stable from an OB standpoint. MFM consult on chart. Stable ultrasound.  As per Dr Parke Poisson (MFM), we will continue: - Daily fetal testing - Start BPPs at 28 weeks (ordered, next one due 8/15) -  Monthly growth ultrasounds (last one was 8/1, EFW 940 g/59%) - Delivery at 34 weeks or earlier should she develop severe preeclampsia - Antenatal corticosteroids are not recommended as she is a brittle diabetic Fetal heart rate tracing reassuring at present. Appreciate IM and Cardiology care for pt.   Jaynie CollinsUgonna  Quinita Kostelecky, MD 01/01/2021,2:48 PM

## 2021-01-01 NOTE — Progress Notes (Signed)
Occupational Therapy Treatment Patient Details Name: Ashley Camacho MRN: [id number] DOB: May 22, 1991 Today's Date: 01/01/2021    History of present illness 30 y.o. female admitted for hyperemesis gravidarum. Medical history significant of [redacted]w[redacted]d on presentation, Estimated Date of Delivery: 03/30/21 by by 9 week ultrasound, Type 1 diabetic, Cardiomyopathy, chronic systolic heart failure, DVT in bilateral arm. H/o recent hospitalizations d/t hyperemesis and sepsis secondary to pneumonia   OT comments  Pt remains with poor activity tolerance limiting engagement in ADLs. She has been walking to access bathroom per pt and nurse report. Encouraged OOB, functional activity as tolerated, sitting up in the recliner when she feels up for that. Pt with episode of emesis after sitting EOB a few minutes. Pt declined OOB activity at this time. D/c plan remains appropriate.     Follow Up Recommendations  No OT follow up;Supervision/Assistance - 24 hour    Equipment Recommendations  None recommended by OT    Recommendations for Other Services      Precautions / Restrictions Precautions Precautions: Other (comment) Precaution Comments: frequent vomiting, cotrak. h/o RUE DVT Restrictions Weight Bearing Restrictions: No       Mobility Bed Mobility Overal bed mobility: Modified Independent                  Transfers                 General transfer comment: unable, emesis while EOB. declined OOB at this time    Balance Overall balance assessment: Mild deficits observed, not formally tested                                         ADL either performed or assessed with clinical judgement   ADL Overall ADL's : Needs assistance/impaired                                       General ADL Comments: Sat EOB for a few minutes then had an episode of emesis. Discussed BUE AROM exercises as tolerated for maintaining strength. Pt reports she has been able  to walk in the room for bathroom trips. Encouraged OOB,functional activity as tolerated.  Did not add resistive exercises d/t h/o recent DVT in RUE.     Vision       Perception     Praxis      Cognition Arousal/Alertness: Awake/alert Behavior During Therapy: Flat affect Overall Cognitive Status: Within Functional Limits for tasks assessed                                 General Comments: limited verbalization        Exercises Exercises: Other exercises Other Exercises Other Exercises: provided handheld therapy squeeze ball for instrinsic hand strength Other Exercises: encouraged AROM BUE as tolerated   Shoulder Instructions       General Comments      Pertinent Vitals/ Pain       Pain Assessment: No/denies pain  Home Living                                          Prior Functioning/Environment  Frequency  Min 2X/week        Progress Toward Goals  OT Goals(current goals can now be found in the care plan section)  Progress towards OT goals: Progressing toward goals  Acute Rehab OT Goals Patient Stated Goal: did not state OT Goal Formulation: With patient Time For Goal Achievement: 01/15/21 Potential to Achieve Goals: Good ADL Goals Pt Will Perform Grooming: with set-up;sitting Pt Will Perform Lower Body Bathing: with min assist;sit to/from stand Pt Will Perform Lower Body Dressing: with min assist;sit to/from stand Pt Will Transfer to Toilet: with supervision;ambulating Pt Will Perform Toileting - Clothing Manipulation and hygiene: with min assist;sit to/from stand  Plan Discharge plan remains appropriate    Co-evaluation                 AM-PAC OT "6 Clicks" Daily Activity     Outcome Measure   Help from another person eating meals?: Total Help from another person taking care of personal grooming?: A Little Help from another person toileting, which includes using toliet, bedpan, or urinal?: A  Little Help from another person bathing (including washing, rinsing, drying)?: A Lot Help from another person to put on and taking off regular upper body clothing?: A Little Help from another person to put on and taking off regular lower body clothing?: A Lot 6 Click Score: 14    End of Session    OT Visit Diagnosis: Unsteadiness on feet (R26.81);Muscle weakness (generalized) (M62.81);Pain;Feeding difficulties (R63.3)   Activity Tolerance Patient limited by fatigue;Other (comment) (emesis while sitting EOB)   Patient Left in bed;with call bell/phone within reach;with nursing/sitter in room   Nurse Communication Other (comment) (Nursing staff present during session)        Time: 1000-1015 OT Time Calculation (min): 15 min  Charges: OT General Charges $OT Visit: 1 Visit OT Treatments $Self Care/Home Management : 8-22 mins  Raynald Kemp, OT Acute Rehabilitation Services Pager: [phone] Office: [phone]     Pilar Grammes 01/01/2021, 2:15 PM

## 2021-01-01 NOTE — Progress Notes (Signed)
Progress Note  Patient Name: Ashley Camacho Date of Encounter: 01/01/2021  [facility] Cardiologist: None   Subjective   No dyspnea. Weight continues to slowly decline without diuretics. In/out net neutral. BP is good  Inpatient Medications    Scheduled Meds:  Chlorhexidine Gluconate Cloth  6 each Topical Daily   doxylamine (Sleep)  12.5 mg Oral QHS   enoxaparin  70 mg Subcutaneous Q12H   feeding supplement (PROSource TF)  45 mL Per Tube BID   ferrous sulfate  300 mg Per Tube QODAY   folic acid  1 mg Intravenous Daily   hydrALAZINE  25 mg Per Tube Q8H   mouth rinse  15 mL Mouth Rinse q12n4p   metoCLOPramide (REGLAN) injection  5 mg Intravenous Q6H   pantoprazole (PROTONIX) IV  40 mg Intravenous Q12H   prenatal vitamin w/FE, FA  1 tablet Oral Q1200   scopolamine  1 patch Transdermal Q72H   sodium chloride flush  10-40 mL Intracatheter Q12H   sodium chloride flush  3 mL Intravenous Q12H   thiamine injection  100 mg Intravenous Daily   vitamin B-6  12.5 mg Oral QHS   Continuous Infusions:  sodium chloride Stopped (12/19/20 1700)   feeding supplement (OSMOLITE 1.5 CAL) 1,000 mL (12/31/20 1800)   insulin 0.4 Units/hr (01/01/21 0508)   lactated ringers 10 mL/hr at 12/31/20 0603   promethazine (PHENERGAN)25mg  in 55mL NS 25 mg (12/28/20 0006)   PRN Meds: sodium chloride, acetaminophen **OR** acetaminophen, dextrose, guaiFENesin-dextromethorphan, loperamide, ondansetron, phenol, prochlorperazine, promethazine (PHENERGAN)25mg  in 104mL NS, sodium chloride flush, sodium chloride flush   Vital Signs    Vitals:   12/31/20 2010 12/31/20 2152 01/01/21 0012 01/01/21 0512  BP:  (!) 136/49 (!) 133/56 (!) 120/57  Pulse:  96 90 90  Resp:   20 20  Temp:   97.6 F (36.4 C) (!) 97.5 F (36.4 C)  TempSrc:   Oral Oral  SpO2: 95%   100%  Weight:      Height:        Intake/Output Summary (Last 24 hours) at 01/01/2021 0837 Last data filed at 01/01/2021 0706 Gross per 24 hour   Intake 2080 ml  Output 1955 ml  Net 125 ml   Last 3 Weights 12/31/2020 12/30/2020 12/29/2020  Weight (lbs) 150 lb 151 lb 3.2 oz 151 lb 4.8 oz  Weight (kg) 68.04 kg 68.584 kg 68.629 kg      Telemetry    NSR - Personally Reviewed  ECG    No new tracing - Personally Reviewed  Physical Exam  Pale GEN: No acute distress.   Neck: No JVD Cardiac: RRR, no murmurs, rubs, or gallops.  Respiratory: Clear to auscultation bilaterally. GI: Soft, nontender, non-distended  MS: 1+ pedal edema; No deformity. Neuro:  Nonfocal  Psych: Normal affect   Labs    High Sensitivity Troponin:   Recent Labs  Lab 12/27/20 1322 12/27/20 1550  TROPONINIHS 8 8      Chemistry Recent Labs  Lab 12/29/20 0220 12/30/20 0400 12/31/20 0815  NA 141 141 142  K 4.3 4.4 4.5  CL 107 110 112*  CO2 26 25 24   GLUCOSE 118* 99 90  BUN 33* 35* 34*  CREATININE 1.41* 1.32* 1.25*  CALCIUM 8.5* 8.5* 8.3*  GFRNONAA 52* 56* 60*  ANIONGAP 8 6 6      Hematology Recent Labs  Lab 12/27/20 0500 12/28/20 0658 12/30/20 0400  WBC 8.6 6.7 5.3  RBC 2.86* 2.78* 2.63*  HGB 8.3* 8.3*  7.6*  HCT 26.6* 25.5* 24.5*  MCV 93.0 91.7 93.2  MCH 29.0 29.9 28.9  MCHC 31.2 32.5 31.0  RDW 15.6* 15.5 15.7*  PLT 211 195 181    BNP Recent Labs  Lab 12/27/20 1322  BNP 929.6*     DDimer No results for input(s): DDIMER in the last 168 hours.   Radiology    No results found.  Cardiac Studies   ECHO 12/26/2020     1. A small pericardial effusion is present. The pericardial effusion is posterior to the left ventricle. There is no evidence of cardiac  tamponade.   2. Left ventricular ejection fraction, by estimation, is 50 to 55%. The left ventricle has low normal function.   3. Right ventricular systolic function is normal. The right ventricular size is normal. There is normal pulmonary artery systolic pressure. The estimated right ventricular systolic pressure is 34.4 mmHg.   4. The mitral valve is grossly normal.  Mild to moderate mitral valve  regurgitation.   5. Tricuspid valve regurgitation is moderate.   6. The aortic valve is normal in structure. Aortic valve regurgitation is not visualized.   7. The inferior vena cava is normal in size with <50% respiratory  variability, suggesting right atrial pressure of 8 mmHg.   Comparison(s): A prior study was performed on 12/17/20. Prior images  reviewed side by side. LVEF has improved slightly. No change in  pericardial effusion.  Patient Profile     30 y.o. female now 72 weeks with type 1 DM, HTN, malnutrition and anemia in setting of intractable nausea/vomiting, presenting with acute decompensation of recent onset chronic combined systolic and diastolic heart failure  Assessment & Plan    Seems to be at a stable volume status, without diuretics since 08/07 at noon. Labs still pending for today. Seems to be showing evidence of improved hemodynamics, maybe due to improved nutrition (was this truly "wet" beri-beri?). Recheck BNP in AM, recheck limited echo next week.  If LVEF does not fully recover, consider coronary CT angio after delivery (despite young age she has had type 1 DM for almost 20 years).     For questions or updates, please contact [facility] Please consult [email or web address] for contact info under        Signed, Thurmon Fair, MD  01/01/2021, 8:37 AM

## 2021-01-02 LAB — TYPE AND SCREEN
ABO/RH(D): A POS
Antibody Screen: NEGATIVE

## 2021-01-02 LAB — GLUCOSE, CAPILLARY
Glucose-Capillary: 99 mg/dL (ref 70–99)
Glucose-Capillary: 69 mg/dL — ABNORMAL LOW (ref 70–99)
Glucose-Capillary: 135 mg/dL — ABNORMAL HIGH (ref 70–99)

## 2021-01-02 LAB — BASIC METABOLIC PANEL
Anion gap: 10 (ref 5–15)
BUN: 35 mg/dL — ABNORMAL HIGH (ref 6–20)
CO2: 23 mmol/L (ref 22–32)
Calcium: 8.8 mg/dL — ABNORMAL LOW (ref 8.9–10.3)
Chloride: 109 mmol/L (ref 98–111)
Creatinine, Ser: 1.27 mg/dL — ABNORMAL HIGH (ref 0.44–1.00)
GFR, Estimated: 59 mL/min — ABNORMAL LOW (ref >60)
Glucose, Bld: 93 mg/dL (ref 70–99)
Potassium: 4.8 mmol/L (ref 3.5–5.1)
Sodium: 142 mmol/L (ref 135–145)

## 2021-01-02 LAB — BRAIN NATRIURETIC PEPTIDE: B Natriuretic Peptide: 512.1 pg/mL — ABNORMAL HIGH (ref 0.0–100.0)

## 2021-01-02 MED ORDER — ALTEPLASE 2 MG IJ SOLR
2.0000 mg | Freq: Once | INTRAMUSCULAR | Status: AC
  Administered 2021-01-02: 2 mg
  Filled 2021-01-02: qty 2

## 2021-01-02 MED ORDER — THIAMINE HCL 100 MG PO TABS
100.0000 mg | ORAL_TABLET | Freq: Every day | ORAL | Status: DC
  Administered 2021-01-03 – 2021-01-13 (×11): 100 mg
  Filled 2021-01-02 (×11): qty 1

## 2021-01-02 MED ORDER — FOLIC ACID 1 MG PO TABS
1.0000 mg | ORAL_TABLET | Freq: Every day | ORAL | Status: DC
  Administered 2021-01-03 – 2021-01-14 (×12): 1 mg
  Filled 2021-01-02 (×12): qty 1

## 2021-01-02 MED ORDER — PANTOPRAZOLE SODIUM 40 MG PO PACK
40.0000 mg | PACK | Freq: Two times a day (BID) | ORAL | Status: DC
  Administered 2021-01-02 – 2021-01-04 (×5): 40 mg
  Filled 2021-01-02 (×10): qty 20

## 2021-01-02 MED ORDER — DEXTROSE 50 % IV SOLN
INTRAVENOUS | Status: AC
  Filled 2021-01-02: qty 50

## 2021-01-02 NOTE — Progress Notes (Signed)
PROGRESS NOTE    Ashley Camacho  ZOX:[id number] DOB: 1990-11-20 DOA: 12/16/2020 PCP: Hildred Laser, MD   Chief Complain:Nausea,vomiting  Brief Narrative:  Patient is a 30 year old female G2,P1 at [redacted] weeks gestation who has a history of type 1 diabetes mellitus.  She presented to the emergency room for evaluation of persistent nausea and vomiting .  She was recently with multiple hospitalization, at [facility] for sepsis secondary to pneumonia and was there for 2 weeks.  Couple hospitalization at [facility] due to intractable nausea and vomiting. -Transferred from [facility] to [facility] 7/26 given her severe malnutrition, with hyperemesis gravidarum, for consideration of cortrak tube insertion.  Patient had core track inserted on admission, so far tolerating tube feed (she remains with significant nausea and vomiting, but this is at baseline and did not worsen with initiation of tube feed), as well her work-up was significant for acute CHF, requiring diuresis, cardiology following. We are giving trial of clear liquid diet .  Continues to have persistent nausea and vomiting so not tolerating that much.  Goal is to start on oral diet with advancement and removal of feeding tube.  Assessment & Plan:   Active Problems:   Anemia affecting pregnancy in second trimester   AKI (acute kidney injury) (HCC)   Hyperemesis   Pregnancy with 24 completed weeks gestation   DKA, type 1, not at goal [facility])   Starvation ketoacidosis   Acute deep vein thrombosis (DVT) of brachial vein of right upper extremity (HCC)   Acute respiratory failure with hypoxia (HCC)   Vitreous hemorrhage of right eye (HCC)   Gastroparesis due to DM (HCC)   Diabetic retinopathy (HCC)   Acute on chronic combined systolic and diastolic CHF (congestive heart failure) (HCC)   Type 1 diabetes mellitus affecting pregnancy in second trimester, antepartum   HFrEF (heart failure with reduced ejection fraction) (HCC)   [redacted] weeks  gestation of pregnancy   Intractable nausea/vomiting, severe protein calorie malnutrition, hyperemesis gravidarum/underlying gastroparesis: Initially presented with persistent nausea and vomiting.  Was on TPN previously which was discontinued on 7/28.  Now being fed through tube.  Tolerating tube feeding.    Was previously started on a steroid for hyperemesis gravidarum but it was discontinued because it did not help.  Currently on Reglan, Zofran, scopolamine, Protonix, Compazine, Phenergan. We are giving trial of clear liquid diet .  Continues to have persistent nausea and vomiting. Goal is to remove the tube feeding at some point.  Type 1 diabetes: Uncontrolled with hyperglycemia alternating with hyperglycemia.  On insulin drip.  Follows with endocrinologist and takes insulin at home.Diabetic coordinator were following,.  Cardiomyopathy/acute systolic congestive heart failure: Currently on hydralazine, as needed Lasix. Has severe bilateral lower extremity edema.  Echo done on 12/28/2020 showed ejection fraction of 50 to 55%, small pericardial effusion.  Echo done on 7/22 showed ejection fraction of 40 to 45%.  Cardiology following.  Lasix on hold due to AKI. Bilateral lower extremity edema could have been contributed due to pregnancy, hypoalbuminemia. Cardiology planning for limited echo next week.  Elevated BNp  Right upper extremity DVT: On Lovenox  AKI on CKD stage II: Hospital course remarkable for AKI, diuresis on hold.  Continue to monitor.Creatinine  has plateaued around 1.2  Normocytic anemia: Most likely  dilutional secondary to pregnancy hypervolemia.  Continue to monitor  Second trimester pregnancy: 27 weeks.  OB-GYN following.  Maternal-fetal medicine recommended daily fetal testing, monthly growth ultrasounds, delivery at 34 weeks, no antenatal corticosteroids.  Nutrition Problem: Inadequate oral intake Etiology: nausea, vomiting      DVT prophylaxis:Lovenox Code  Status: Full Family Communication: Boyfriend at the bedside on 8.8.22 Status is: Inpatient  Remains inpatient appropriate because:Inpatient level of care appropriate due to severity of illness  Dispo: The patient is from: Home              Anticipated d/c is to: Home              Patient currently is not medically stable to d/c.   Difficult to place patient No  Unable to discharge the patient until she is able to tolerate oral food/removal of feeding tube   Consultants: Cardiology, GYN  Procedures: None  Antimicrobials:  Anti-infectives (From admission, onward)    None       Subjective: Patient seen and examined the bedside this morning.  Hemodynamically stable.  Same as yesterday.  Continues to have nausea, intermittent vomiting.  Looks weak.  Objective: Vitals:   01/02/21 0509 01/02/21 0510 01/02/21 0603 01/02/21 0724  BP: (!) 127/57  (!) 127/57 (!) 124/50  Pulse: 89   91  Resp:   17 16  Temp:   (!) 97.1 F (36.2 C) 98.1 F (36.7 C)  TempSrc:   Axillary Axillary  SpO2:  93% 93% 94%  Weight:      Height:        Intake/Output Summary (Last 24 hours) at 01/02/2021 0738 Last data filed at 01/02/2021 0300 Gross per 24 hour  Intake 1160.52 ml  Output 1400 ml  Net -239.48 ml   Filed Weights   12/30/20 0501 12/31/20 0533 01/02/21 0504  Weight: 68.6 kg 68 kg 67.4 kg    Examination:  General exam: weak, not in distress, lying in bed HEENT: PERRL Respiratory system:  no wheezes or crackles  Cardiovascular system: S1 & S2 heard, RRR.  Gastrointestinal system: Abdomen is soft and nontender. Central nervous system: Alert and oriented Extremities: 2-3+ bilateral lower extremity pitting edema, no clubbing ,no cyanosis Skin: No rashes, no ulcers,no icterus     Data Reviewed: I have personally reviewed following labs and imaging studies  CBC: Recent Labs  Lab 12/27/20 0500 12/28/20 0658 12/30/20 0400 01/01/21 1536  WBC 8.6 6.7 5.3 4.7  NEUTROABS  --   --   3.7  --   HGB 8.3* 8.3* 7.6* 8.1*  HCT 26.6* 25.5* 24.5* 25.5*  MCV 93.0 91.7 93.2 92.7  PLT 211 195 181 174   Basic Metabolic Panel: Recent Labs  Lab 12/29/20 0220 12/30/20 0400 12/31/20 0815 01/01/21 1536 01/02/21 0540  NA 141 141 142 141 142  K 4.3 4.4 4.5 4.8 4.8  CL 107 110 112* 112* 109  CO2 26 25 24 23 23   GLUCOSE 118* 99 90 88 93  BUN 33* 35* 34* 35* 35*  CREATININE 1.41* 1.32* 1.25* 1.26* 1.27*  CALCIUM 8.5* 8.5* 8.3* 8.4* 8.8*  MG 2.4  --   --   --   --    GFR: Estimated Creatinine Clearance: 57.4 mL/min (A) (by C-G formula based on SCr of 1.27 mg/dL (H)). Liver Function Tests: Recent Labs  Lab 01/01/21 1536  AST 24  ALT 18  ALKPHOS 153*  BILITOT 0.8  PROT 5.8*  ALBUMIN 2.9*   No results for input(s): LIPASE, AMYLASE in the last 168 hours. No results for input(s): AMMONIA in the last 168 hours. Coagulation Profile: No results for input(s): INR, PROTIME in the last 168 hours. Cardiac Enzymes: No results for  input(s): CKTOTAL, CKMB, CKMBINDEX, TROPONINI in the last 168 hours. BNP (last 3 results) No results for input(s): PROBNP in the last 8760 hours. HbA1C: No results for input(s): HGBA1C in the last 72 hours. CBG: Recent Labs  Lab 12/31/20 1558 12/31/20 2006 01/01/21 0100 01/01/21 0506 01/02/21 0504  GLUCAP 119* 88 99 102* 99   Lipid Profile: No results for input(s): CHOL, HDL, LDLCALC, TRIG, CHOLHDL, LDLDIRECT in the last 72 hours. Thyroid Function Tests: No results for input(s): TSH, T4TOTAL, FREET4, T3FREE, THYROIDAB in the last 72 hours. Anemia Panel: No results for input(s): VITAMINB12, FOLATE, FERRITIN, TIBC, IRON, RETICCTPCT in the last 72 hours. Sepsis Labs: No results for input(s): PROCALCITON, LATICACIDVEN in the last 168 hours.  No results found for this or any previous visit (from the past 240 hour(s)).        Radiology Studies: No results found.      Scheduled Meds:  Chlorhexidine Gluconate Cloth  6 each Topical  Daily   doxylamine (Sleep)  12.5 mg Oral QHS   enoxaparin  70 mg Subcutaneous Q12H   feeding supplement (PROSource TF)  45 mL Per Tube BID   ferrous sulfate  300 mg Per Tube QODAY   folic acid  1 mg Intravenous Daily   hydrALAZINE  25 mg Per Tube Q8H   mouth rinse  15 mL Mouth Rinse q12n4p   metoCLOPramide (REGLAN) injection  5 mg Intravenous Q6H   pantoprazole (PROTONIX) IV  40 mg Intravenous Q12H   prenatal vitamin w/FE, FA  1 tablet Oral Q1200   scopolamine  1 patch Transdermal Q72H   sodium chloride flush  10-40 mL Intracatheter Q12H   sodium chloride flush  3 mL Intravenous Q12H   thiamine injection  100 mg Intravenous Daily   vitamin B-6  12.5 mg Oral QHS   Continuous Infusions:  sodium chloride Stopped (12/19/20 1700)   feeding supplement (OSMOLITE 1.5 CAL) 1,000 mL (01/01/21 1605)   insulin 1 Units/hr (01/02/21 0700)   lactated ringers 10 mL/hr at 01/01/21 1903   promethazine (PHENERGAN)25mg  in 40mL NS 25 mg (01/01/21 1639)     LOS: 17 days    Time spent:25 mins. More than 50% of that time was spent in counseling and/or coordination of care.      Burnadette Pop, MD [facility] P8/04/2021, 7:38 AM

## 2021-01-02 NOTE — Progress Notes (Signed)
Physical Therapy Treatment Patient Details Name: Ashley Camacho MRN: [id number] DOB: 1990-10-22 Today's Date: 01/02/2021    History of Present Illness 30 y.o. female admitted for hyperemesis gravidarum. Medical history significant of [redacted]w[redacted]d on presentation, Estimated Date of Delivery: 03/30/21 by by 9 week ultrasound, Type 1 diabetic, Cardiomyopathy, chronic systolic heart failure, DVT in bilateral arm. H/o recent hospitalizations d/t hyperemesis and sepsis secondary to pneumonia    PT Comments    Pt agreeable to walk with therapist's strong encouragement, remains limited by nausea and weakness.  RN reporting she is getting up in the room taking herself to the bathroom regularly, but has limited longer gait distance.  She remains very flat with lights off and blinds closed tight every time I have seen her.  I am concerned about her mental state with all of this.  PT will continue to follow acutely for safe mobility progression.  Goals updated, bring rollator for ready made chair next session.   Follow Up Recommendations  No PT follow up     Equipment Recommendations  None recommended by PT    Recommendations for Other Services       Precautions / Restrictions Precautions Precautions: Other (comment) Precaution Comments: frequent vomiting, cotrak. h/o RUE DVT (BPs taken in legs)    Mobility  Bed Mobility Overal bed mobility: Modified Independent                  Transfers Overall transfer level: Modified independent                  Ambulation/Gait Ambulation/Gait assistance: Supervision Gait Distance (Feet): 100 Feet (100x1, 30x1. 70x1) Assistive device: None Gait Pattern/deviations: Step-through pattern;Decreased stride length;Wide base of support Gait velocity: decr Gait velocity interpretation: <1.31 ft/sec, indicative of household ambulator General Gait Details: Pt fatigues quickly with activity and required seated rest break. Pt with continued nauses but  dry heaves only during session. I did pause her tube feed during mobility to see if it would help her nausea (no real effect).  She would benefit from something to sit on frequently throughout gait, bringing a rollator for her to sit on, but not use for walking may work.   Stairs             Wheelchair Mobility    Modified Rankin (Stroke Patients Only)       Balance Overall balance assessment: Mild deficits observed, not formally tested                                          Cognition Arousal/Alertness: Awake/alert Behavior During Therapy: Flat affect (very)                                   General Comments: Pt in the dark in the middle of the day with all lights off and curtains drawn tight.  RN reports this is her normal.      Exercises      General Comments        Pertinent Vitals/Pain Pain Assessment: No/denies pain    Home Living                      Prior Function            PT Goals (current goals can now be  found in the care plan section) Acute Rehab PT Goals Patient Stated Goal: did not state PT Goal Formulation: With patient Time For Goal Achievement: 01/16/21 Potential to Achieve Goals: Good Progress towards PT goals: Progressing toward goals    Frequency    Min 2X/week      PT Plan Current plan remains appropriate    Co-evaluation              AM-PAC PT "6 Clicks" Mobility   Outcome Measure  Help needed turning from your back to your side while in a flat bed without using bedrails?: None Help needed moving from lying on your back to sitting on the side of a flat bed without using bedrails?: None Help needed moving to and from a bed to a chair (including a wheelchair)?: None Help needed standing up from a chair using your arms (e.g., wheelchair or bedside chair)?: None Help needed to walk in hospital room?: A Little Help needed climbing 3-5 steps with a railing? : A Little 6 Click  Score: 22    End of Session   Activity Tolerance: Other (comment);Patient limited by fatigue (by nausea) Patient left: in bed;with call bell/phone within reach;Other (comment) (seated EOB, RNs allowing her to go to bathroom on her own) Nurse Communication: Mobility status PT Visit Diagnosis: Other abnormalities of gait and mobility (R26.89);Muscle weakness (generalized) (M62.81)     Time: 6720-9470 PT Time Calculation (min) (ACUTE ONLY): 24 min  Charges:  $Gait Training: 23-37 mins                     Corinna Capra, PT, DPT  Acute Rehabilitation Ortho Tech Supervisor [phone] pager [phone]) [phone] office

## 2021-01-02 NOTE — Progress Notes (Signed)
Progress Note  Patient Name: Ashley Camacho Date of Encounter: 01/02/2021  [facility] Cardiologist: None   Subjective   Breathing fine.  Weight continues to spontaneously trend down without diuretics.  BNP is abnormal but is lower than last week. Albumin is up to 2.9 from a minimum of 1.5 early during this admission.  Inpatient Medications    Scheduled Meds:  Chlorhexidine Gluconate Cloth  6 each Topical Daily   doxylamine (Sleep)  12.5 mg Oral QHS   enoxaparin  70 mg Subcutaneous Q12H   feeding supplement (PROSource TF)  45 mL Per Tube BID   ferrous sulfate  300 mg Per Tube QODAY   folic acid  1 mg Intravenous Daily   hydrALAZINE  25 mg Per Tube Q8H   mouth rinse  15 mL Mouth Rinse q12n4p   metoCLOPramide (REGLAN) injection  5 mg Intravenous Q6H   pantoprazole (PROTONIX) IV  40 mg Intravenous Q12H   prenatal vitamin w/FE, FA  1 tablet Oral Q1200   scopolamine  1 patch Transdermal Q72H   sodium chloride flush  10-40 mL Intracatheter Q12H   sodium chloride flush  3 mL Intravenous Q12H   thiamine injection  100 mg Intravenous Daily   vitamin B-6  12.5 mg Oral QHS   Continuous Infusions:  sodium chloride Stopped (12/19/20 1700)   feeding supplement (OSMOLITE 1.5 CAL) 1,000 mL (01/01/21 1605)   insulin 0.9 Units/hr (01/02/21 0858)   lactated ringers 10 mL/hr at 01/01/21 1903   promethazine (PHENERGAN)25mg  in 36mL NS 25 mg (01/01/21 1639)   PRN Meds: sodium chloride, acetaminophen **OR** acetaminophen, dextrose, diphenhydrAMINE-zinc acetate, guaiFENesin-dextromethorphan, hydrocortisone cream, loperamide, ondansetron, phenol, prochlorperazine, promethazine (PHENERGAN)25mg  in 36mL NS, sodium chloride flush, sodium chloride flush   Vital Signs    Vitals:   01/02/21 0509 01/02/21 0510 01/02/21 0603 01/02/21 0724  BP: (!) 127/57  (!) 127/57 (!) 124/50  Pulse: 89   91  Resp:   17 16  Temp:   (!) 97.1 F (36.2 C) 98.1 F (36.7 C)  TempSrc:   Axillary Axillary  SpO2:   93% 93% 94%  Weight:      Height:        Intake/Output Summary (Last 24 hours) at 01/02/2021 0933 Last data filed at 01/02/2021 0300 Gross per 24 hour  Intake 1038.72 ml  Output 1400 ml  Net -361.28 ml   Last 3 Weights 01/02/2021 12/31/2020 12/30/2020  Weight (lbs) 148 lb 9.4 oz 150 lb 151 lb 3.2 oz  Weight (kg) 67.4 kg 68.04 kg 68.584 kg      Telemetry    Sinus rhythm- Personally Reviewed  ECG    No new tracing- Personally Reviewed  Physical Exam  Appears comfortable lying flat GEN: No acute distress.   Neck: No JVD Cardiac: RRR, no murmurs, rubs, or gallops.  Respiratory: Clear to auscultation bilaterally. GI: Soft, nontender, non-distended .  Gravid abdomen. MS: 1+ symmetrical pedal edema; No deformity. Neuro:  Nonfocal  Psych: Normal affect   Labs    High Sensitivity Troponin:   Recent Labs  Lab 12/27/20 1322 12/27/20 1550  TROPONINIHS 8 8      Chemistry Recent Labs  Lab 12/31/20 0815 01/01/21 1536 01/02/21 0540  NA 142 141 142  K 4.5 4.8 4.8  CL 112* 112* 109  CO2 24 23 23   GLUCOSE 90 88 93  BUN 34* 35* 35*  CREATININE 1.25* 1.26* 1.27*  CALCIUM 8.3* 8.4* 8.8*  PROT  --  5.8*  --  ALBUMIN  --  2.9*  --   AST  --  24  --   ALT  --  18  --   ALKPHOS  --  153*  --   BILITOT  --  0.8  --   GFRNONAA 60* 59* 59*  ANIONGAP 6 6 10      Hematology Recent Labs  Lab 12/28/20 0658 12/30/20 0400 01/01/21 1536  WBC 6.7 5.3 4.7  RBC 2.78* 2.63* 2.75*  HGB 8.3* 7.6* 8.1*  HCT 25.5* 24.5* 25.5*  MCV 91.7 93.2 92.7  MCH 29.9 28.9 29.5  MCHC 32.5 31.0 31.8  RDW 15.5 15.7* 15.5  PLT 195 181 174    BNP Recent Labs  Lab 12/27/20 1322 01/02/21 0500  BNP 929.6* 512.1*     DDimer No results for input(s): DDIMER in the last 168 hours.   Radiology    No results found.  Cardiac Studies   ECHO 12/26/2020     1. A small pericardial effusion is present. The pericardial effusion is posterior to the left ventricle. There is no evidence of  cardiac  tamponade.   2. Left ventricular ejection fraction, by estimation, is 50 to 55%. The left ventricle has low normal function.   3. Right ventricular systolic function is normal. The right ventricular size is normal. There is normal pulmonary artery systolic pressure. The estimated right ventricular systolic pressure is 34.4 mmHg.   4. The mitral valve is grossly normal. Mild to moderate mitral valve  regurgitation.   5. Tricuspid valve regurgitation is moderate.   6. The aortic valve is normal in structure. Aortic valve regurgitation is not visualized.   7. The inferior vena cava is normal in size with <50% respiratory  variability, suggesting right atrial pressure of 8 mmHg.   Comparison(s): A prior study was performed on 12/17/20. Prior images  reviewed side by side. LVEF has improved slightly. No change in  pericardial effusion.    Patient Profile     30 y.o. female now 81 weeks with type 1 DM, HTN, malnutrition and anemia in setting of intractable nausea/vomiting, presenting with acute decompensation of recent onset chronic combined systolic and diastolic heart failure  Assessment & Plan    Continues to slowly lose fluid/weight without diuretics since 0807 at noon. Stable renal parameters and improved BNP level. Hopefully this is a sign of improved cardiac function and improved nutrition.  Per notes, current plan is probably a C-section around 34 weeks.  Would anticipate weight gain of 0.5-1.0 pounds a week, therefore at least a 3-4 pound weight gain during that time and need to keep that in account when assessing volume status going forward.      For questions or updates, please contact [facility] Please consult [email or web address] for contact info under        Signed, 34, MD  01/02/2021, 9:33 AM

## 2021-01-02 NOTE — Progress Notes (Signed)
Patient ID: Ashley Camacho, female   DOB: 03-22-1991, 30 y.o.   MRN: [id number] ACULTY PRACTICE ANTEPARTUM COMPREHENSIVE PROGRESS NOTE  Ashley Camacho is a 30 y.o. G2P0101 at [redacted]w[redacted]d  who is admitted to IM service for management of medical complications.  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  Fetal presentation is cephalic on U/S 12/29/20  Length of Stay:  17  Days  Subjective: No obstetric issues or complaints this morning. Still having nausea and vomiting around her feeding tube.  Patient reports good fetal movement.  She reports no uterine contractions, no bleeding and no loss of fluid per vagina.  Patient denies any headaches, visual symptoms, RUQ/epigastric pain or other concerning symptoms.   Vitals:  Blood pressure (!) 124/50, pulse 91, temperature 98.1 F (36.7 C), temperature source Axillary, resp. rate 16, height 5\' 1"  (1.549 m), weight 67.4 kg, SpO2 94 %. Patient Vitals for the past 24 hrs:  BP Temp Temp src Pulse Resp SpO2 Weight  01/02/21 0724 (!) 124/50 98.1 F (36.7 C) Axillary 91 16 94 % --  01/02/21 0603 (!) 127/57 (!) 97.1 F (36.2 C) Axillary -- 17 93 % --  01/02/21 0510 -- -- -- -- -- 93 % --  01/02/21 0509 (!) 127/57 -- -- 89 -- -- --  01/02/21 0504 -- -- -- -- -- -- 67.4 kg  01/02/21 0143 (!) 157/93 97.8 F (36.6 C) Oral 93 18 96 % --  01/01/21 2207 (!) 126/56 -- -- -- -- -- --  01/01/21 1952 (!) 124/56 98 F (36.7 C) Oral 90 16 96 % --  01/01/21 1609 138/65 97.9 F (36.6 C) Oral 90 18 98 % --  01/01/21 1140 (!) 154/89 97.8 F (36.6 C) Oral 90 18 97 % --     Physical Examination: Gen NAD Lungs normal breath sounds Heart RRR Abd soft, NT, gravid Ext 2 + edema  Fetal Monitoring:  Baseline: 130 bpm, Variability: Moderate {6-25 bpm), Accelerations: Reactive, and Decelerations: rare variable >> Category 1   Labs:   Results for orders placed or performed during the hospital encounter of 12/16/20 (from the past 24 hour(s))  Comprehensive metabolic panel   Collection Time: 01/01/21  3:36 PM  Result Value Ref Range   Sodium 141 135 - 145 mmol/L   Potassium 4.8 3.5 - 5.1 mmol/L   Chloride 112 (H) 98 - 111 mmol/L   CO2 23 22 - 32 mmol/L   Glucose, Bld 88 70 - 99 mg/dL   BUN 35 (H) 6 - 20 mg/dL   Creatinine, Ser 03/03/21 (H) 0.44 - 1.00 mg/dL   Calcium 8.4 (L) 8.9 - 10.3 mg/dL   Total Protein 5.8 (L) 6.5 - 8.1 g/dL   Albumin 2.9 (L) 3.5 - 5.0 g/dL   AST 24 15 - 41 U/L   ALT 18 0 - 44 U/L   Alkaline Phosphatase 153 (H) 38 - 126 U/L   Total Bilirubin 0.8 0.3 - 1.2 mg/dL   GFR, Estimated 59 (L) >60 mL/min   Anion gap 6 5 - 15  CBC   Collection Time: 01/01/21  3:36 PM  Result Value Ref Range   WBC 4.7 4.0 - 10.5 K/uL   RBC 2.75 (L) 3.87 - 5.11 MIL/uL   Hemoglobin 8.1 (L) 12.0 - 15.0 g/dL   HCT 03/03/21 (L) 02.1 - 11.5 %   MCV 92.7 80.0 - 100.0  fL   MCH 29.5 26.0 - 34.0 pg   MCHC 31.8 30.0 - 36.0 g/dL   RDW 02.7 25.3 - 66.4 %   Platelets 174 150 - 400 K/uL   nRBC 0.0 0.0 - 0.2 %  Brain natriuretic peptide   Collection Time: 01/02/21  5:00 AM  Result Value Ref Range   B Natriuretic Peptide 512.1 (H) 0.0 - 100.0 pg/mL  Glucose, capillary   Collection Time: 01/02/21  5:04 AM  Result Value Ref Range   Glucose-Capillary 99 70 - 99 mg/dL  Basic metabolic panel   Collection Time: 01/02/21  5:40 AM  Result Value Ref Range   Sodium 142 135 - 145 mmol/L   Potassium 4.8 3.5 - 5.1 mmol/L   Chloride 109 98 - 111 mmol/L   CO2 23 22 - 32 mmol/L   Glucose, Bld 93 70 - 99 mg/dL   BUN 35 (H) 6 - 20 mg/dL   Creatinine, Ser 4.03 (H) 0.44 - 1.00 mg/dL   Calcium 8.8 (L) 8.9 - 10.3 mg/dL   GFR, Estimated 59 (L) >60 mL/min   Anion gap 10 5 - 15  Type and screen [facility]   Collection Time: 01/02/21  5:40 AM  Result Value Ref Range   ABO/RH(D) A POS    Antibody Screen NEG    Sample Expiration       01/05/2021,2359 Performed at [facility], [address], [place], [place] [address]     Imaging Studies:    [place] MFM OB LIMITED  Result Date: 12/30/2020 ----------------------------------------------------------------------  OBSTETRICS REPORT                       (Signed Final 12/30/2020 08:16 am) ---------------------------------------------------------------------- Patient Info  ID #:       [id number]                          D.O.B.:  1991-02-19 (30 yrs)  Name:       Ashley Camacho [facility]                 Visit Date: 12/29/2020 06:06 pm ---------------------------------------------------------------------- Performed By  Attending:        Noralee Space MD        Ref. Address:     [facility]                                                             [facility]                                                             [address]                                                             [address]  [address]                                                             [place]                                                             [address]  Performed By:     Hurman Horn          [facility]  Referred By:      Linzie Collin MD ---------------------------------------------------------------------- Orders  #  Description                           Code        Ordered By  1  [place] MFM OB LIMITED                     11914.78    Jaynie Collins ----------------------------------------------------------------------  #  Order #                     Accession #                Episode #  1  [id number]                   [id number]                 [id number] ---------------------------------------------------------------------- Indications  [redacted] weeks gestation of pregnancy                Z3A.27  Polyhydramnios, second trimester,               O40.2XX0  antepartum condition or complication, fetus  unspecified  Pre-existing diabetes, type 1, in pregnancy,   O24.012  second trimester  Hyperemesis gravidarum                         O21.0  Anemia during pregnancy in second trimester    O99.012  Abnormal fetal ultrasound (? liver             O28.9  calcification) ---------------------------------------------------------------------- Fetal Evaluation  Num Of Fetuses:         1  Fetal Heart Rate(bpm):  148  Cardiac Activity:       Observed  Presentation:           Cephalic  Placenta:               Posterior  P. Cord Insertion:      Visualized  Amniotic Fluid  AFI FV:      Polyhydramnios                              Largest Pocket(cm)                              10.3  Comment:    Stomach, bladder, and diaphragm seen. ---------------------------------------------------------------------- Gestational Age  Best:          27w 0d     Det. ByMarcella Dubs         EDD:   03/30/21                                      (08/27/20) ---------------------------------------------------------------------- Anatomy  Abdomen:               Echogenic Focus ---------------------------------------------------------------------- Cervix Uterus Adnexa  Cervix  Length:           3.87  cm.  Normal appearance by transabdominal scan.  Uterus  No abnormality visualized.  Right Ovary  Not visualized.  Left Ovary  Not visualized.  Adnexa  No abnormality visualized. ---------------------------------------------------------------------- Impression  Patient is admitted for control of diabetes.  A limited ultrasound study was performed .Mild  polyhydramnios is seen. A solitary echogenic focus in the  liver consistent with intrahepatic calcification is seen. Patient  had cell-free fetal DNA screening that showed low risks for  fetal aneuploidies. Fetal anatomical survey performed at 20  weeks was reported as normal. In the absence of associated  anomalies, the risk of chromosomal defects is  not increased.  Will counsel the patient on the finding at our next visit. ---------------------------------------------------------------------- Recommendations  Follow-up limited scan next week. ----------------------------------------------------------------------                  Noralee Space, MD Electronically Signed Final Report   12/30/2020 08:16 am ----------------------------------------------------------------------     Medications:  Scheduled  Chlorhexidine Gluconate Cloth  6 each Topical Daily   doxylamine (Sleep)  12.5 mg Oral QHS   enoxaparin  70 mg Subcutaneous Q12H   feeding supplement (PROSource TF)  45 mL Per Tube BID   ferrous sulfate  300 mg Per Tube QODAY   folic acid  1 mg Intravenous Daily   hydrALAZINE  25 mg Per Tube Q8H   mouth rinse  15 mL Mouth Rinse q12n4p   metoCLOPramide (REGLAN) injection  5 mg Intravenous Q6H   pantoprazole (PROTONIX) IV  40 mg Intravenous Q12H   prenatal vitamin w/FE, FA  1 tablet Oral Q1200   scopolamine  1 patch Transdermal Q72H   sodium chloride flush  10-40 mL Intracatheter Q12H   sodium chloride flush  3 mL Intravenous Q12H   thiamine injection  100 mg Intravenous Daily   vitamin B-6  12.5 mg Oral QHS   I have reviewed the patient's current medications.  ASSESSMENT & Plan  IUP [redacted]w[redacted]d Multiple medical complications, managed by [facility] team  Stable BP today, nml labs, no severe symptoms.  Continue to monitor closely. Otherwise, stable from an OB standpoint. MFM consult on chart from 12/31/20. Stable ultrasound on 12/31/10.   As per Dr Parke Poisson (MFM), we will continue: - Daily fetal testing - Start BPPs at 28 weeks (ordered, next one due 8/15) - Monthly growth ultrasounds (  last one was 8/1, EFW 940 g/59%) - Delivery at 34 weeks or earlier should she develop severe preeclampsia - Antenatal corticosteroids are not recommended as she is a brittle diabetic Fetal heart rate tracing reassuring at present. Appreciate IM and Cardiology care for  pt.   Jaynie CollinsUgonna Clydie Dillen, MD 01/02/2021,10:29 AM

## 2021-01-03 LAB — GLUCOSE, CAPILLARY: Glucose-Capillary: 86 mg/dL (ref 70–99)

## 2021-01-03 NOTE — Progress Notes (Signed)
PROGRESS NOTE    Ashley Camacho  ONG:[id number] DOB: 04-18-1991 DOA: 12/16/2020 PCP: Hildred Laser, MD   Chief Complain:Nausea,vomiting  Brief Narrative:  Patient is a 30 year old female G2,P1 at [redacted] weeks gestation who has a history of type 1 diabetes mellitus.  She presented to the emergency room for evaluation of persistent nausea and vomiting .  She was recently with multiple hospitalization, at [facility] for sepsis secondary to pneumonia and was there for 2 weeks.  Couple hospitalization at [facility] due to intractable nausea and vomiting. -Transferred from [facility] to [facility] 7/26 given her severe malnutrition, with hyperemesis gravidarum, for consideration of cortrak tube insertion.  Patient had core track inserted on admission, so far tolerating tube feed (she remains with significant nausea and vomiting, but this is at baseline and did not worsen with initiation of tube feed), as well her work-up was significant for acute CHF, requiring diuresis, cardiology following. We are giving trial of clear liquid diet .  Continues to have persistent nausea and vomiting so not tolerating that much.  Goal is to start on oral diet with advancement and removal of feeding tube.  Assessment & Plan:   Active Problems:   Anemia affecting pregnancy in second trimester   AKI (acute kidney injury) (HCC)   Hyperemesis   Pregnancy with 24 completed weeks gestation   DKA, type 1, not at goal [facility])   Starvation ketoacidosis   Acute deep vein thrombosis (DVT) of brachial vein of right upper extremity (HCC)   Acute respiratory failure with hypoxia (HCC)   Vitreous hemorrhage of right eye (HCC)   Gastroparesis due to DM (HCC)   Diabetic retinopathy (HCC)   Acute on chronic combined systolic and diastolic CHF (congestive heart failure) (HCC)   Type 1 diabetes mellitus affecting pregnancy in second trimester, antepartum   HFrEF (heart failure with reduced ejection fraction) (HCC)   [redacted] weeks  gestation of pregnancy   Intractable nausea/vomiting, severe protein calorie malnutrition, hyperemesis gravidarum/underlying gastroparesis: Initially presented with persistent nausea and vomiting.  Was on TPN previously which was discontinued on 7/28.  Now being fed through tube.  Tolerating tube feeding.    Was previously started on a steroid for hyperemesis gravidarum but it was discontinued because it did not help.  Currently on Reglan, Zofran, scopolamine, Protonix, Compazine, Phenergan. We are giving trial of clear liquid diet .  Continues to have persistent nausea and vomiting. Goal is to remove the tube feeding at some point.  Type 1 diabetes: Uncontrolled with hyperglycemia alternating with hyperglycemia.  On insulin drip.  Follows with endocrinologist and takes insulin at home.Diabetic coordinator were following,.  Cardiomyopathy/acute systolic congestive heart failure: Currently on hydralazine, as needed Lasix. Has severe bilateral lower extremity edema.  Echo done on 12/28/2020 showed ejection fraction of 50 to 55%, small pericardial effusion.  Echo done on 7/22 showed ejection fraction of 40 to 45%.  Cardiology following.  Lasix on hold due to AKI. Bilateral lower extremity edema could have been contributed due to pregnancy, hypoalbuminemia. Cardiology planning for limited echo next week.  Elevated BNp  Right upper extremity DVT: On Lovenox  AKI on CKD stage II: Hospital course remarkable for AKI, diuresis on hold.  Continue to monitor.Creatinine  has plateaued around 1.2  Normocytic anemia: Most likely  dilutional secondary to pregnancy hypervolemia.  Continue to monitor  Second trimester pregnancy: 27 weeks.  OB-GYN following.  Maternal-fetal medicine recommended daily fetal testing, monthly growth ultrasounds, delivery at 34 weeks, no antenatal corticosteroids.  Nutrition Problem: Inadequate oral intake Etiology: nausea, vomiting      DVT prophylaxis:Lovenox Code  Status: Full Family Communication: Boyfriend at the bedside on 8.8.22 Status is: Inpatient  Remains inpatient appropriate because:Inpatient level of care appropriate due to severity of illness  Dispo: The patient is from: Home              Anticipated d/c is to: Home              Patient currently is not medically stable to d/c.   Difficult to place patient No  Unable to discharge the patient until she is able to tolerate oral food/removal of feeding tube   Consultants: Cardiology, GYN  Procedures: None  Antimicrobials:  Anti-infectives (From admission, onward)    None       Subjective: Patient seen and examined at the bedside today.  Hemodynamically stable.  Lying in bed, weak as yesterday.  Continues to complain of nausea and vomiting.  Encouraged her to try clear liquid diet if she tolerates  Objective: Vitals:   01/02/21 2007 01/02/21 2243 01/02/21 2327 01/03/21 0517  BP: 135/64 130/61 (!) 153/81 (!) 125/55  Pulse: 92  96 90  Resp: 16  19 19   Temp: (!) 97.2 F (36.2 C)  (!) 96.1 F (35.6 C) 98.4 F (36.9 C)  TempSrc: Axillary  Axillary Axillary  SpO2: 96%  95% 98%  Weight:    68.7 kg  Height:        Intake/Output Summary (Last 24 hours) at 01/03/2021 0738 Last data filed at 01/03/2021 0500 Gross per 24 hour  Intake 586.21 ml  Output 1200 ml  Net -613.79 ml   Filed Weights   12/31/20 0533 01/02/21 0504 01/03/21 0517  Weight: 68 kg 67.4 kg 68.7 kg    Examination:  General exam: weak, not in distress HEENT: PERRL Respiratory system:  no wheezes or crackles  Cardiovascular system: S1 & S2 heard, RRR.  Gastrointestinal system: Abdomen is soft and nontender. Central nervous system: Alert and oriented Extremities: 2-3+ bilateral lower extremity pitting edema, no clubbing ,no cyanosis Skin: No rashes, no ulcers,no icterus    Data Reviewed: I have personally reviewed following labs and imaging studies  CBC: Recent Labs  Lab 12/28/20 0658  12/30/20 0400 01/01/21 1536  WBC 6.7 5.3 4.7  NEUTROABS  --  3.7  --   HGB 8.3* 7.6* 8.1*  HCT 25.5* 24.5* 25.5*  MCV 91.7 93.2 92.7  PLT 195 181 174   Basic Metabolic Panel: Recent Labs  Lab 12/29/20 0220 12/30/20 0400 12/31/20 0815 01/01/21 1536 01/02/21 0540  NA 141 141 142 141 142  K 4.3 4.4 4.5 4.8 4.8  CL 107 110 112* 112* 109  CO2 26 25 24 23 23   GLUCOSE 118* 99 90 88 93  BUN 33* 35* 34* 35* 35*  CREATININE 1.41* 1.32* 1.25* 1.26* 1.27*  CALCIUM 8.5* 8.5* 8.3* 8.4* 8.8*  MG 2.4  --   --   --   --    GFR: Estimated Creatinine Clearance: 58 mL/min (A) (by C-G formula based on SCr of 1.27 mg/dL (H)). Liver Function Tests: Recent Labs  Lab 01/01/21 1536  AST 24  ALT 18  ALKPHOS 153*  BILITOT 0.8  PROT 5.8*  ALBUMIN 2.9*   No results for input(s): LIPASE, AMYLASE in the last 168 hours. No results for input(s): AMMONIA in the last 168 hours. Coagulation Profile: No results for input(s): INR, PROTIME in the last 168 hours. Cardiac Enzymes: No  results for input(s): CKTOTAL, CKMB, CKMBINDEX, TROPONINI in the last 168 hours. BNP (last 3 results) No results for input(s): PROBNP in the last 8760 hours. HbA1C: No results for input(s): HGBA1C in the last 72 hours. CBG: Recent Labs  Lab 01/01/21 0506 01/02/21 0504 01/02/21 1456 01/02/21 1534 01/03/21 0517  GLUCAP 102* 99 69* 135* 86   Lipid Profile: No results for input(s): CHOL, HDL, LDLCALC, TRIG, CHOLHDL, LDLDIRECT in the last 72 hours. Thyroid Function Tests: No results for input(s): TSH, T4TOTAL, FREET4, T3FREE, THYROIDAB in the last 72 hours. Anemia Panel: No results for input(s): VITAMINB12, FOLATE, FERRITIN, TIBC, IRON, RETICCTPCT in the last 72 hours. Sepsis Labs: No results for input(s): PROCALCITON, LATICACIDVEN in the last 168 hours.  No results found for this or any previous visit (from the past 240 hour(s)).        Radiology Studies: No results found.      Scheduled Meds:   Chlorhexidine Gluconate Cloth  6 each Topical Daily   doxylamine (Sleep)  12.5 mg Oral QHS   enoxaparin  70 mg Subcutaneous Q12H   feeding supplement (PROSource TF)  45 mL Per Tube BID   ferrous sulfate  300 mg Per Tube QODAY   folic acid  1 mg Per Tube Daily   hydrALAZINE  25 mg Per Tube Q8H   mouth rinse  15 mL Mouth Rinse q12n4p   metoCLOPramide (REGLAN) injection  5 mg Intravenous Q6H   pantoprazole sodium  40 mg Per Tube BID   prenatal vitamin w/FE, FA  1 tablet Oral Q1200   scopolamine  1 patch Transdermal Q72H   sodium chloride flush  10-40 mL Intracatheter Q12H   sodium chloride flush  3 mL Intravenous Q12H   thiamine  100 mg Per Tube Daily   vitamin B-6  12.5 mg Oral QHS   Continuous Infusions:  sodium chloride Stopped (12/19/20 1700)   feeding supplement (OSMOLITE 1.5 CAL) 1,000 mL (01/02/21 1318)   insulin 1.2 Units/hr (01/03/21 0725)   lactated ringers 10 mL/hr at 01/01/21 1903   promethazine (PHENERGAN)25mg  in 17mL NS 25 mg (01/02/21 1740)     LOS: 18 days    Time spent:25 mins. More than 50% of that time was spent in counseling and/or coordination of care.      Burnadette Pop, MD [facility] P8/13/2022, 7:38 AM

## 2021-01-03 NOTE — Progress Notes (Signed)
   I's/O's reviewed -continues to be net negative, appears to be auto diuresing, weight is up 1Kg -eating better. Creatinine stable yesterday. Echo showed LVEF 50 to 55% with small pericardial effusion posterior to left ventricle. BNP 512. Would continue to monitor with plan for probable C-section around 34 weeks as per MFM.  Blood pressure appears normal.  We will be available over the weekend as needed and follow-up on Monday. No need for diuretics if she remains net negative.  Chrystie Nose, MD, [facility], FACP  [place]  [facility]  Medical Director of the [facility] Lipid Disorders &  Cardiovascular Risk Reduction Clinic Diplomate of the American Board of Clinical Lipidology Attending Cardiologist  Direct Dial: [phone]  Fax: [phone]  Website:  [email or web address]

## 2021-01-03 NOTE — Progress Notes (Signed)
Patient ID: Ashley Camacho, female   DOB: [birth year], 30 y.o.   MRN: [id number] FACULTY PRACTICE ANTEPARTUM(COMPREHENSIVE) NOTE  Ashley Camacho is a 30 y.o. G2P0101 at [redacted]w[redacted]d  who is admitted to IM service for management of medical complications.  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  Fetal presentation is unsure. Length of Stay:  18  Days  Date of admission:12/16/2020  Subjective: Pt states she feels about the same Patient reports the fetal movement as active. Patient reports uterine contraction  activity as none. Patient reports  vaginal bleeding as none. Patient describes fluid per vagina as None.  Vitals:  Blood pressure (!) 125/55, pulse 90, temperature 98.4 F (36.9 C), temperature source Axillary, resp. rate 19, height 5\' 1"  (1.549 m), weight 68.7 kg, SpO2 98 %. Vitals:   01/02/21 2007 01/02/21 2243 01/02/21 2327 01/03/21 0517  BP: 135/64 130/61 (!) 153/81 (!) 125/55  Pulse: 92  96 90  Resp: 16  19 19   Temp: (!) 97.2 F (36.2 C)  (!) 96.1 F (35.6 C) 98.4 F (36.9 C)  TempSrc: Axillary  Axillary Axillary  SpO2: 96%  95% 98%  Weight:    68.7 kg  Height:       Physical Examination:  General appearance - in mild to moderate distress and chronically ill appearing Abdomen - soft, nontender, nondistended, no masses or organomegaly Fundal Height:  size equals dates Pelvic Exam:  examination not indicated Cervical Exam: Not evaluated. Extremities: extremities normal, atraumatic, no cyanosis or edema with DTRs 2+ bilaterally Membranes:intact  Fetal Monitoring:  Baseline: 140s bpm, Variability: Good {> 6 bpm), Accelerations: Reactive, and Decelerations: Absent   reactive  Labs:  Results for orders placed or performed during the hospital encounter of 12/16/20 (from the past 24 hour(s))  Glucose, capillary   Collection Time: 01/02/21   2:56 PM  Result Value Ref Range   Glucose-Capillary 69 (L) 70 - 99 mg/dL  Glucose, capillary   Collection Time: 01/02/21  3:34 PM  Result Value Ref Range   Glucose-Capillary 135 (H) 70 - 99 mg/dL  Glucose, capillary   Collection Time: 01/03/21  5:17 AM  Result Value Ref Range   Glucose-Capillary 86 70 - 99 mg/dL    Imaging Studies:    No results found.   Medications:  Scheduled  Chlorhexidine Gluconate Cloth  6 each Topical Daily   doxylamine (Sleep)  12.5 mg Oral QHS   enoxaparin  70 mg Subcutaneous Q12H   feeding supplement (PROSource TF)  45 mL Per Tube BID   ferrous sulfate  300 mg Per Tube QODAY   folic acid  1 mg Per Tube Daily   hydrALAZINE  25 mg Per Tube Q8H   mouth rinse  15 mL Mouth Rinse q12n4p   metoCLOPramide (REGLAN) injection  5 mg Intravenous Q6H   pantoprazole sodium  40 mg Per Tube BID   prenatal vitamin w/FE, FA  1 tablet Oral Q1200   scopolamine  1 patch Transdermal Q72H   sodium chloride flush  10-40 mL Intracatheter Q12H   sodium chloride flush  3 mL Intravenous Q12H   thiamine  100 mg Per Tube Daily   vitamin B-6  12.5 mg Oral QHS   I have reviewed the patient's current medications.  ASSESSMENT: 03/04/21 [redacted]w[redacted]d Estimated Date of Delivery: 03/30/21  Patient Active Problem List   Diagnosis Date Noted   [redacted] weeks  gestation of pregnancy    Type 1 diabetes mellitus affecting pregnancy in second trimester, antepartum    HFrEF (heart failure with reduced ejection fraction) (HCC)    Acute deep vein thrombosis (DVT) of brachial vein of right upper extremity (HCC) 12/17/2020   Acute respiratory failure with hypoxia (HCC) 12/17/2020   Anxiety 12/17/2020   Diabetic retinopathy (HCC) 12/17/2020   Acute on chronic combined systolic and diastolic CHF (congestive heart failure) (HCC)    Starvation ketoacidosis 12/16/2020   Hyperemesis 12/13/2020   Pregnancy with 24 completed weeks gestation 12/13/2020   DKA, type 1, not at goal [facility]) 12/13/2020   Sunburn  12/13/2020   Hypothyroidism 12/10/2020   Malnutrition (HCC) 12/10/2020   Vitreous hemorrhage of right eye (HCC) 12/10/2020   Pleural effusion associated with pulmonary infection 11/25/2020   Pneumonia affecting pregnancy 11/24/2020   Diabetes mellitus affecting pregnancy, second trimester 11/24/2020   Edema 11/24/2020   Decreased urine output 11/24/2020   Shortness of breath    Zinc deficiency    SOB (shortness of breath)    Social problem 11/21/2020   Nausea and vomiting during pregnancy prior to [redacted] weeks gestation 11/21/2020   Low serum vitamin B12    Indication for care in labor and delivery, antepartum 11/20/2020   Anemia affecting pregnancy in second trimester    Absolute anemia    AKI (acute kidney injury) (HCC)    Nausea and vomiting during pregnancy 10/23/2020   History of premature delivery, currently pregnant, second trimester 10/13/2020   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  Continue to monitor closely. Otherwise, stable from an OB standpoint. MFM consult on chart from 12/31/20. Stable ultrasound on 12/31/10.   As per Dr Parke Poisson (MFM), we will continue: - Daily fetal testing - Start BPPs at 28 weeks (ordered, next one due 8/15) - Monthly growth ultrasounds (last one was 8/1, EFW 940 g/59%) - Delivery at 34 weeks or earlier should she develop severe preeclampsia - Antenatal corticosteroids are not recommended as she is a brittle diabetic Fetal heart rate tracing reassuring at present. Appreciate IM and Cardiology care for pt.     Ashley Camacho 01/03/2021,7:32 AM

## 2021-01-03 NOTE — Progress Notes (Signed)
Took a couple of bites of gelatin and a few sips of juice.

## 2021-01-04 DIAGNOSIS — O99019 Anemia complicating pregnancy, unspecified trimester: Secondary | ICD-10-CM | POA: Diagnosis present

## 2021-01-04 DIAGNOSIS — D638 Anemia in other chronic diseases classified elsewhere: Secondary | ICD-10-CM | POA: Diagnosis present

## 2021-01-04 DIAGNOSIS — R0603 Acute respiratory distress: Secondary | ICD-10-CM

## 2021-01-04 LAB — COMPREHENSIVE METABOLIC PANEL
ALT: 20 U/L (ref 0–44)
AST: 27 U/L (ref 15–41)
Albumin: 2.7 g/dL — ABNORMAL LOW (ref 3.5–5.0)
Alkaline Phosphatase: 166 U/L — ABNORMAL HIGH (ref 38–126)
Anion gap: 7 (ref 5–15)
BUN: 35 mg/dL — ABNORMAL HIGH (ref 6–20)
CO2: 22 mmol/L (ref 22–32)
Calcium: 8.4 mg/dL — ABNORMAL LOW (ref 8.9–10.3)
Chloride: 113 mmol/L — ABNORMAL HIGH (ref 98–111)
Creatinine, Ser: 1.31 mg/dL — ABNORMAL HIGH (ref 0.44–1.00)
GFR, Estimated: 57 mL/min — ABNORMAL LOW (ref >60)
Glucose, Bld: 143 mg/dL — ABNORMAL HIGH (ref 70–99)
Potassium: 5 mmol/L (ref 3.5–5.1)
Sodium: 142 mmol/L (ref 135–145)
Total Bilirubin: 1.4 mg/dL — ABNORMAL HIGH (ref 0.3–1.2)
Total Protein: 5.6 g/dL — ABNORMAL LOW (ref 6.5–8.1)

## 2021-01-04 LAB — URINALYSIS, ROUTINE W REFLEX MICROSCOPIC
Bacteria, UA: NONE SEEN
Bilirubin Urine: NEGATIVE
Glucose, UA: NEGATIVE mg/dL
Ketones, ur: NEGATIVE mg/dL
Nitrite: NEGATIVE
Protein, ur: 100 mg/dL — AB
RBC / HPF: >50 RBC/hpf — ABNORMAL HIGH (ref 0–5)
Specific Gravity, Urine: 1.014 (ref 1.005–1.030)
WBC, UA: >50 WBC/hpf — ABNORMAL HIGH (ref 0–5)
pH: 8 (ref 5.0–8.0)

## 2021-01-04 LAB — GLUCOSE, CAPILLARY: Glucose-Capillary: 101 mg/dL — ABNORMAL HIGH (ref 70–99)

## 2021-01-04 NOTE — Progress Notes (Signed)
ANTICOAGULATION CONSULT NOTE - Follow Up Consult  Pharmacy Consult for enoxaparin Indication:  VTE treatment for right brachial DVT  No Known Allergies  Patient Measurements: Height: 5\' 1"  (154.9 cm) Weight: 69.7 kg (153 lb 11.2 oz) IBW/kg (Calculated) : 47.8 Heparin Dosing Weight: 70.2 kg  Vital Signs: Temp: 97.5 F (36.4 C) (08/14 0700) Temp Source: Axillary (08/14 0700) BP: 138/61 (08/14 0754) Pulse Rate: 89 (08/14 0754)  Labs: Recent Labs    01/01/21 1536 01/02/21 0540  HGB 8.1*  --   HCT 25.5*  --   PLT 174  --   CREATININE 1.26* 1.27*    Estimated Creatinine Clearance: 58.4 mL/min (A) (by C-G formula based on SCr of 1.27 mg/dL (H)).   Medications:  Scheduled:   Chlorhexidine Gluconate Cloth  6 each Topical Daily   doxylamine (Sleep)  12.5 mg Oral QHS   enoxaparin  70 mg Subcutaneous Q12H   feeding supplement (PROSource TF)  45 mL Per Tube BID   ferrous sulfate  300 mg Per Tube QODAY   folic acid  1 mg Per Tube Daily   hydrALAZINE  25 mg Per Tube Q8H   mouth rinse  15 mL Mouth Rinse q12n4p   metoCLOPramide (REGLAN) injection  5 mg Intravenous Q6H   pantoprazole sodium  40 mg Per Tube BID   prenatal vitamin w/FE, FA  1 tablet Oral Q1200   scopolamine  1 patch Transdermal Q72H   sodium chloride flush  10-40 mL Intracatheter Q12H   sodium chloride flush  3 mL Intravenous Q12H   thiamine  100 mg Per Tube Daily   vitamin B-6  12.5 mg Oral QHS    Assessment: Last anti-Xa level on 8/8 was at goal. Scr has improved with last value 1.27 mg/dL, notable for h/o CKD stage II. Weight is maintained at 69.7 kg. She is currently on tube feeds.  Goal of Therapy:  Anti-Xa level 0.6-1 units/ml 4hrs after LMWH dose given Monitor platelets by anticoagulation protocol: Yes   Plan:  Lovenox 70mg  q12h. Plan to obtain peak level 8/15 with AM dose.  Jamee Keach 01/04/2021,8:46 AM

## 2021-01-04 NOTE — Progress Notes (Signed)
PROGRESS NOTE    Ashley Camacho  ZOX:[id number] DOB: 1990-11-20 DOA: 12/16/2020 PCP: Hildred Laser, MD   Chief Complain:Nausea,vomiting  Brief Narrative:  Patient is a 30 year old female G2,P1 at [redacted] weeks gestation who has a history of type 1 diabetes mellitus.  She presented to the emergency room for evaluation of persistent nausea and vomiting .  She was recently with multiple hospitalization, at [facility] for sepsis secondary to pneumonia and was there for 2 weeks.  Couple hospitalization at [facility] due to intractable nausea and vomiting. -Transferred from [facility] to [facility] 7/26 given her severe malnutrition, with hyperemesis gravidarum, for consideration of cortrak tube insertion.  Patient had core track inserted on admission, so far tolerating tube feed (she remains with significant nausea and vomiting, but this is at baseline and did not worsen with initiation of tube feed), as well her work-up was significant for acute CHF, requiring diuresis, cardiology following. We are giving trial of clear liquid diet .  Continues to have persistent nausea and vomiting so not tolerating that much.  Goal is to start on oral diet with advancement and removal of feeding tube.  Assessment & Plan:   Active Problems:   Anemia affecting pregnancy in second trimester   AKI (acute kidney injury) (HCC)   Hyperemesis   Pregnancy with 24 completed weeks gestation   DKA, type 1, not at goal [facility])   Starvation ketoacidosis   Acute deep vein thrombosis (DVT) of brachial vein of right upper extremity (HCC)   Acute respiratory failure with hypoxia (HCC)   Vitreous hemorrhage of right eye (HCC)   Gastroparesis due to DM (HCC)   Diabetic retinopathy (HCC)   Acute on chronic combined systolic and diastolic CHF (congestive heart failure) (HCC)   Type 1 diabetes mellitus affecting pregnancy in second trimester, antepartum   HFrEF (heart failure with reduced ejection fraction) (HCC)   [redacted] weeks  gestation of pregnancy   Intractable nausea/vomiting, severe protein calorie malnutrition, hyperemesis gravidarum/underlying gastroparesis: Initially presented with persistent nausea and vomiting.  Was on TPN previously which was discontinued on 7/28.  Now being fed through tube.  Tolerating tube feeding.    Was previously started on a steroid for hyperemesis gravidarum but it was discontinued because it did not help.  Currently on Reglan, Zofran, scopolamine, Protonix, Compazine, Phenergan. We are giving trial of clear liquid diet .  Continues to have persistent nausea and vomiting. Goal is to remove the tube feeding at some point.  Type 1 diabetes: Uncontrolled with hyperglycemia alternating with hyperglycemia.  On insulin drip.  Follows with endocrinologist and takes insulin at home.Diabetic coordinator were following,.  Cardiomyopathy/acute systolic congestive heart failure: Currently on hydralazine, as needed Lasix. Has severe bilateral lower extremity edema.  Echo done on 12/28/2020 showed ejection fraction of 50 to 55%, small pericardial effusion.  Echo done on 7/22 showed ejection fraction of 40 to 45%.  Cardiology following.  Lasix on hold due to AKI. Bilateral lower extremity edema could have been contributed due to pregnancy, hypoalbuminemia. Cardiology planning for limited echo next week.  Elevated BNp  Right upper extremity DVT: On Lovenox  AKI on CKD stage II: Hospital course remarkable for AKI, diuresis on hold.  Continue to monitor.Creatinine  has plateaued around 1.2  Normocytic anemia: Most likely  dilutional secondary to pregnancy hypervolemia.  Continue to monitor  Second trimester pregnancy: 27 weeks.  OB-GYN following.  Maternal-fetal medicine recommended daily fetal testing, monthly growth ultrasounds, delivery at 34 weeks, no antenatal corticosteroids.  Nutrition Problem: Inadequate oral intake Etiology: nausea, vomiting      DVT prophylaxis:Lovenox Code  Status: Full Family Communication: Boyfriend at the bedside on 8.8.22 Status is: Inpatient  Remains inpatient appropriate because:Inpatient level of care appropriate due to severity of illness  Dispo: The patient is from: Home              Anticipated d/c is to: Home              Patient currently is not medically stable to d/c.   Difficult to place patient No  Unable to discharge the patient until she is able to tolerate oral food/removal of feeding tube   Consultants: Cardiology, GYN  Procedures: None  Antimicrobials:  Anti-infectives (From admission, onward)    None       Subjective: Patient seen and examined the bedside this morning.  Hemodynamically stable.  Had some nausea and vomited last night.  She tolerated some clear liquid diet.  I encouraged her to try clear liquid diet again today.  Denies any new complaints  Objective: Vitals:   01/03/21 1935 01/03/21 2330 01/04/21 0515 01/04/21 0535  BP: (!) 120/51 128/65 (!) 126/54   Pulse: 91 91 89   Resp: 18 20 20    Temp: 98.1 F (36.7 C) 98 F (36.7 C) 98.5 F (36.9 C)   TempSrc: Oral Oral Oral   SpO2: 97% 97% 96%   Weight:    69.7 kg  Height:        Intake/Output Summary (Last 24 hours) at 01/04/2021 0704 Last data filed at 01/04/2021 0600 Gross per 24 hour  Intake 3710.45 ml  Output 1625 ml  Net 2085.45 ml   Filed Weights   01/02/21 0504 01/03/21 0517 01/04/21 0535  Weight: 67.4 kg 68.7 kg 69.7 kg    Examination:  General exam:  not in distress,weak HEENT: PERRL Respiratory system:  no wheezes or crackles  Cardiovascular system: S1 & S2 heard, RRR.  Gastrointestinal system: Abdomen is nondistended, soft and nontender. Central nervous system: Alert and oriented Extremities: 2-3 + bilateral LE edema, no clubbing ,no cyanosis Skin: No rashes, no ulcers,no icterus     Data Reviewed: I have personally reviewed following labs and imaging studies  CBC: Recent Labs  Lab 12/30/20 0400 01/01/21 1536   WBC 5.3 4.7  NEUTROABS 3.7  --   HGB 7.6* 8.1*  HCT 24.5* 25.5*  MCV 93.2 92.7  PLT 181 174   Basic Metabolic Panel: Recent Labs  Lab 12/29/20 0220 12/30/20 0400 12/31/20 0815 01/01/21 1536 01/02/21 0540  NA 141 141 142 141 142  K 4.3 4.4 4.5 4.8 4.8  CL 107 110 112* 112* 109  CO2 26 25 24 23 23   GLUCOSE 118* 99 90 88 93  BUN 33* 35* 34* 35* 35*  CREATININE 1.41* 1.32* 1.25* 1.26* 1.27*  CALCIUM 8.5* 8.5* 8.3* 8.4* 8.8*  MG 2.4  --   --   --   --    GFR: Estimated Creatinine Clearance: 58.4 mL/min (A) (by C-G formula based on SCr of 1.27 mg/dL (H)). Liver Function Tests: Recent Labs  Lab 01/01/21 1536  AST 24  ALT 18  ALKPHOS 153*  BILITOT 0.8  PROT 5.8*  ALBUMIN 2.9*   No results for input(s): LIPASE, AMYLASE in the last 168 hours. No results for input(s): AMMONIA in the last 168 hours. Coagulation Profile: No results for input(s): INR, PROTIME in the last 168 hours. Cardiac Enzymes: No results for input(s): CKTOTAL, CKMB, CKMBINDEX,  TROPONINI in the last 168 hours. BNP (last 3 results) No results for input(s): PROBNP in the last 8760 hours. HbA1C: No results for input(s): HGBA1C in the last 72 hours. CBG: Recent Labs  Lab 01/02/21 0504 01/02/21 1456 01/02/21 1534 01/03/21 0517 01/04/21 0509  GLUCAP 99 69* 135* 86 101*   Lipid Profile: No results for input(s): CHOL, HDL, LDLCALC, TRIG, CHOLHDL, LDLDIRECT in the last 72 hours. Thyroid Function Tests: No results for input(s): TSH, T4TOTAL, FREET4, T3FREE, THYROIDAB in the last 72 hours. Anemia Panel: No results for input(s): VITAMINB12, FOLATE, FERRITIN, TIBC, IRON, RETICCTPCT in the last 72 hours. Sepsis Labs: No results for input(s): PROCALCITON, LATICACIDVEN in the last 168 hours.  No results found for this or any previous visit (from the past 240 hour(s)).        Radiology Studies: No results found.      Scheduled Meds:  Chlorhexidine Gluconate Cloth  6 each Topical Daily    doxylamine (Sleep)  12.5 mg Oral QHS   enoxaparin  70 mg Subcutaneous Q12H   feeding supplement (PROSource TF)  45 mL Per Tube BID   ferrous sulfate  300 mg Per Tube QODAY   folic acid  1 mg Per Tube Daily   hydrALAZINE  25 mg Per Tube Q8H   mouth rinse  15 mL Mouth Rinse q12n4p   metoCLOPramide (REGLAN) injection  5 mg Intravenous Q6H   pantoprazole sodium  40 mg Per Tube BID   prenatal vitamin w/FE, FA  1 tablet Oral Q1200   scopolamine  1 patch Transdermal Q72H   sodium chloride flush  10-40 mL Intracatheter Q12H   sodium chloride flush  3 mL Intravenous Q12H   thiamine  100 mg Per Tube Daily   vitamin B-6  12.5 mg Oral QHS   Continuous Infusions:  sodium chloride Stopped (12/19/20 1700)   feeding supplement (OSMOLITE 1.5 CAL) 1,000 mL (01/04/21 0515)   insulin 0.9 Units/hr (01/04/21 0510)   lactated ringers 10 mL/hr at 01/03/21 1002   promethazine (PHENERGAN)25mg  in 50mL NS 25 mg (01/02/21 1740)     LOS: 19 days    Time spent:25 mins. More than 50% of that time was spent in counseling and/or coordination of care.      Burnadette Pop, MD [facility] P8/14/2022, 7:04 AM

## 2021-01-04 NOTE — Progress Notes (Signed)
Patient ID: Ashley Camacho, female   DOB: January 06, 1991, 30 y.o.   MRN: [id number] FACULTY PRACTICE ANTEPARTUM(COMPREHENSIVE) NOTE  Ashley Camacho is a 30 y.o. G2P0101 at [redacted]w[redacted]d  who is admitted to IM service for management of medical complications.  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  Fetal presentation is unsure. Length of Stay:  19 Days  Date of admission:12/16/2020  Subjective: Pt states she feels about the same Patient reports the fetal movement as active. Patient reports uterine contraction  activity as none. Patient reports  vaginal bleeding as none. Patient describes fluid per vagina as None.  Vitals:  Blood pressure (!) 113/50, pulse 89, temperature 98.5 F (36.9 C), temperature source Oral, resp. rate 20, height 5\' 1"  (1.549 m), weight 69.7 kg, SpO2 94 %. Vitals:   01/04/21 1150 01/04/21 1458 01/04/21 1531 01/04/21 1925  BP:  (!) 119/53 (!) 119/53 (!) 113/50  Pulse:  93 93 89  Resp:  18 18 20   Temp:  98 F (36.7 C) 98 F (36.7 C) 98.5 F (36.9 C)  TempSrc:  Axillary Axillary Oral  SpO2: 96% 97% 97% 94%  Weight:      Height:       Physical Examination:  General appearance - in mild to moderate distress and chronically ill appearing Abdomen - soft, nontender, nondistended, no masses or organomegaly Fundal Height:  size equals dates Pelvic Exam:  examination not indicated Cervical Exam: Not evaluated. Extremities: extremities normal, atraumatic, no cyanosis or edema with DTRs 2+ bilaterally Membranes:intact  Fetal Monitoring:  Baseline: 140s bpm, Variability: Good {> 6 bpm), Accelerations: Reactive, and Decelerations: Absent   reactive  Labs:  Results for orders placed or performed during the hospital encounter of 12/16/20 (from the past 24 hour(s))  Glucose, capillary   Collection Time: 01/04/21  5:09 AM  Result Value Ref  Range   Glucose-Capillary 101 (H) 70 - 99 mg/dL  Urinalysis, Routine w reflex microscopic   Collection Time: 01/04/21 12:14 PM  Result Value Ref Range   Color, Urine AMBER (A) YELLOW   APPearance CLOUDY (A) CLEAR   Specific Gravity, Urine 1.014 1.005 - 1.030   pH 8.0 5.0 - 8.0   Glucose, UA NEGATIVE NEGATIVE mg/dL   Hgb urine dipstick MODERATE (A) NEGATIVE   Bilirubin Urine NEGATIVE NEGATIVE   Ketones, ur NEGATIVE NEGATIVE mg/dL   Protein, ur 01/06/21 (A) NEGATIVE mg/dL   Nitrite NEGATIVE NEGATIVE   Leukocytes,Ua TRACE (A) NEGATIVE   RBC / HPF >50 (H) 0 - 5 RBC/hpf   WBC, UA >50 (H) 0 - 5 WBC/hpf   Bacteria, UA NONE SEEN NONE SEEN   Squamous Epithelial / LPF 6-10 0 - 5   WBC Clumps PRESENT   Comprehensive metabolic panel   Collection Time: 01/04/21  4:45 PM  Result Value Ref Range   Sodium 142 135 - 145 mmol/L   Potassium 5.0 3.5 - 5.1 mmol/L   Chloride 113 (H) 98 - 111 mmol/L   CO2 22 22 - 32 mmol/L   Glucose, Bld 143 (H) 70 - 99 mg/dL   BUN 35 (H) 6 - 20 mg/dL   Creatinine, Ser 621 (H) 0.44 - 1.00 mg/dL   Calcium 8.4 (L) 8.9 - 10.3 mg/dL   Total Protein 5.6 (L) 6.5 - 8.1 g/dL   Albumin 2.7 (L) 3.5 - 5.0 g/dL   AST 27 15 - 41 U/L  ALT 20 0 - 44 U/L   Alkaline Phosphatase 166 (H) 38 - 126 U/L   Total Bilirubin 1.4 (H) 0.3 - 1.2 mg/dL   GFR, Estimated 57 (L) >60 mL/min   Anion gap 7 5 - 15    Imaging Studies:    No results found.   Medications:  Scheduled  Chlorhexidine Gluconate Cloth  6 each Topical Daily   doxylamine (Sleep)  12.5 mg Oral QHS   enoxaparin  70 mg Subcutaneous Q12H   feeding supplement (PROSource TF)  45 mL Per Tube BID   ferrous sulfate  300 mg Per Tube QODAY   folic acid  1 mg Per Tube Daily   hydrALAZINE  25 mg Per Tube Q8H   mouth rinse  15 mL Mouth Rinse q12n4p   metoCLOPramide (REGLAN) injection  5 mg Intravenous Q6H   pantoprazole sodium  40 mg Per Tube BID   prenatal vitamin w/FE, FA  1 tablet Oral Q1200   scopolamine  1 patch Transdermal  Q72H   sodium chloride flush  10-40 mL Intracatheter Q12H   sodium chloride flush  3 mL Intravenous Q12H   thiamine  100 mg Per Tube Daily   vitamin B-6  12.5 mg Oral QHS   I have reviewed the patient's current medications.  ASSESSMENT: G2P0101 [redacted]w[redacted]d Estimated Date of Delivery: 03/30/21  Patient Active Problem List   Diagnosis Date Noted   [redacted] weeks gestation of pregnancy    Type 1 diabetes mellitus affecting pregnancy in second trimester, antepartum    HFrEF (heart failure with reduced ejection fraction) (HCC)    Acute deep vein thrombosis (DVT) of brachial vein of right upper extremity (HCC) 12/17/2020   Acute respiratory failure with hypoxia (HCC) 12/17/2020   Anxiety 12/17/2020   Diabetic retinopathy (HCC) 12/17/2020   Acute on chronic combined systolic and diastolic CHF (congestive heart failure) (HCC)    Starvation ketoacidosis 12/16/2020   Hyperemesis 12/13/2020   Pregnancy with 24 completed weeks gestation 12/13/2020   DKA, type 1, not at goal [facility]) 12/13/2020   Sunburn 12/13/2020   Hypothyroidism 12/10/2020   Malnutrition (HCC) 12/10/2020   Vitreous hemorrhage of right eye (HCC) 12/10/2020   Pleural effusion associated with pulmonary infection 11/25/2020   Pneumonia affecting pregnancy 11/24/2020   Diabetes mellitus affecting pregnancy, second trimester 11/24/2020   Edema 11/24/2020   Decreased urine output 11/24/2020   Shortness of breath    Zinc deficiency    SOB (shortness of breath)    Social problem 11/21/2020   Nausea and vomiting during pregnancy prior to [redacted] weeks gestation 11/21/2020   Low serum vitamin B12    Indication for care in labor and delivery, antepartum 11/20/2020   Anemia affecting pregnancy in second trimester    Absolute anemia    AKI (acute kidney injury) (HCC)    Nausea and vomiting during pregnancy 10/23/2020   History of premature delivery, currently pregnant, second trimester 10/13/2020   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  Continue to monitor closely. Otherwise, stable from an OB standpoint. MFM consult on chart from 12/31/20. Stable ultrasound on 12/31/10.   As per Dr Parke Poisson (MFM), we  will continue: - Daily fetal testing - Start BPPs at 28 weeks (ordered, next one due 8/15) - Monthly growth ultrasounds (last one was 8/1, EFW 940 g/59%) - Delivery at 34 weeks or earlier should she develop severe preeclampsia - Antenatal corticosteroids are not recommended as she is a brittle diabetic Fetal heart rate tracing reassuring at present. Appreciate IM and Cardiology care for pt.    I saw the patient this amprior to 0800 Ashley Camacho 01/04/2021,8:41 PM     Patient ID: MADAILEIN LONDO, female   DOB: 07/03/1990, 30 y.o.   MRN: [id number]

## 2021-01-05 ENCOUNTER — Inpatient Hospital Stay (HOSPITAL_BASED_OUTPATIENT_CLINIC_OR_DEPARTMENT_OTHER): Payer: Medicaid Other

## 2021-01-05 ENCOUNTER — Inpatient Hospital Stay (HOSPITAL_COMMUNITY): Payer: Medicaid Other

## 2021-01-05 DIAGNOSIS — O403XX0 Polyhydramnios, third trimester, not applicable or unspecified: Secondary | ICD-10-CM | POA: Diagnosis not present

## 2021-01-05 DIAGNOSIS — O99013 Anemia complicating pregnancy, third trimester: Secondary | ICD-10-CM | POA: Diagnosis not present

## 2021-01-05 DIAGNOSIS — Z3A28 28 weeks gestation of pregnancy: Secondary | ICD-10-CM

## 2021-01-05 DIAGNOSIS — O24012 Pre-existing diabetes mellitus, type 1, in pregnancy, second trimester: Secondary | ICD-10-CM

## 2021-01-05 DIAGNOSIS — O210 Mild hyperemesis gravidarum: Secondary | ICD-10-CM

## 2021-01-05 DIAGNOSIS — D649 Anemia, unspecified: Secondary | ICD-10-CM

## 2021-01-05 DIAGNOSIS — O24013 Pre-existing diabetes mellitus, type 1, in pregnancy, third trimester: Secondary | ICD-10-CM

## 2021-01-05 DIAGNOSIS — E109 Type 1 diabetes mellitus without complications: Secondary | ICD-10-CM

## 2021-01-05 DIAGNOSIS — O289 Unspecified abnormal findings on antenatal screening of mother: Secondary | ICD-10-CM

## 2021-01-05 LAB — CBC WITH DIFFERENTIAL/PLATELET
Abs Immature Granulocytes: 0.01 10*3/uL (ref 0.00–0.07)
Basophils Absolute: 0 10*3/uL (ref 0.0–0.1)
Basophils Relative: 1 %
Eosinophils Absolute: 0.2 10*3/uL (ref 0.0–0.5)
Eosinophils Relative: 4 %
HCT: 23.8 % — ABNORMAL LOW (ref 36.0–46.0)
Hemoglobin: 7.5 g/dL — ABNORMAL LOW (ref 12.0–15.0)
Immature Granulocytes: 0 %
Lymphocytes Relative: 21 %
Lymphs Abs: 0.9 10*3/uL (ref 0.7–4.0)
MCH: 29.5 pg (ref 26.0–34.0)
MCHC: 31.5 g/dL (ref 30.0–36.0)
MCV: 93.7 fL (ref 80.0–100.0)
Monocytes Absolute: 0.4 10*3/uL (ref 0.1–1.0)
Monocytes Relative: 8 %
Neutro Abs: 2.8 10*3/uL (ref 1.7–7.7)
Neutrophils Relative %: 66 %
Platelets: 159 10*3/uL (ref 150–400)
RBC: 2.54 MIL/uL — ABNORMAL LOW (ref 3.87–5.11)
RDW: 15.8 % — ABNORMAL HIGH (ref 11.5–15.5)
WBC: 4.3 10*3/uL (ref 4.0–10.5)
nRBC: 0 % (ref 0.0–0.2)

## 2021-01-05 LAB — GLUCOSE, CAPILLARY
Glucose-Capillary: 104 mg/dL — ABNORMAL HIGH (ref 70–99)
Glucose-Capillary: 108 mg/dL — ABNORMAL HIGH (ref 70–99)
Glucose-Capillary: 118 mg/dL — ABNORMAL HIGH (ref 70–99)
Glucose-Capillary: 89 mg/dL (ref 70–99)

## 2021-01-05 LAB — BASIC METABOLIC PANEL
Anion gap: 5 (ref 5–15)
BUN: 35 mg/dL — ABNORMAL HIGH (ref 6–20)
CO2: 23 mmol/L (ref 22–32)
Calcium: 8.2 mg/dL — ABNORMAL LOW (ref 8.9–10.3)
Chloride: 114 mmol/L — ABNORMAL HIGH (ref 98–111)
Creatinine, Ser: 1.28 mg/dL — ABNORMAL HIGH (ref 0.44–1.00)
GFR, Estimated: 58 mL/min — ABNORMAL LOW (ref >60)
Glucose, Bld: 114 mg/dL — ABNORMAL HIGH (ref 70–99)
Potassium: 5 mmol/L (ref 3.5–5.1)
Sodium: 142 mmol/L (ref 135–145)

## 2021-01-05 LAB — HEPARIN ANTI-XA: Heparin LMW: 0.93 IU/mL

## 2021-01-05 MED ORDER — PANTOPRAZOLE SODIUM 40 MG IV SOLR
40.0000 mg | Freq: Two times a day (BID) | INTRAVENOUS | Status: DC
  Administered 2021-01-05 – 2021-01-07 (×6): 40 mg via INTRAVENOUS
  Filled 2021-01-05 (×6): qty 40

## 2021-01-05 MED ORDER — SALINE SPRAY 0.65 % NA SOLN
1.0000 | NASAL | Status: DC | PRN
  Administered 2021-01-05 – 2021-01-10 (×2): 1 via NASAL
  Filled 2021-01-05 (×2): qty 44

## 2021-01-05 NOTE — Progress Notes (Signed)
Inpatient Diabetes Program Recommendations  Diabetes Treatment Program Recommendations  ADA Standards of Care 2018 Diabetes in Pregnancy Target Glucose Ranges:  Fasting: 60 - 90 mg/dL Preprandial: 60 - 098 mg/dL 1 hr postprandial: Less than 140mg /dL (from first bite of meal) 2 hr postprandial: Less than 120 mg/dL (from first bite of meal)      Lab Results  Component Value Date   GLUCAP 89 01/05/2021   HGBA1C 5.8 (H) 11/21/2020    Freestyle Libre sensor applied to right upper arm. Previous sensor removed from left upper arm. Will be able to obtain readings in 60 minutes using new device.  Patient unable to tolerate clear liquids at this time. Has no further questions related to DM.  Following.   Thanks, 01/22/2021, MSN, RNC-OB Diabetes Coordinator [phone] (8a-5p)

## 2021-01-05 NOTE — Progress Notes (Signed)
RN offered pt clear liquid options. Pt refuses at this time. Educated pt on being able to tolerate diet before having feeding tube removed. Pt verbalized understanding.

## 2021-01-05 NOTE — Progress Notes (Addendum)
FACULTY PRACTICE ANTEPARTUM COMPREHENSIVE PROGRESS NOTE  Ashley Camacho is a 30 y.o. G2P0101 at [redacted]w[redacted]d who is admitted for chronic N/V in s/o T1DM, CHTN, malnutrition and anemia in s/o  intractable nausea/vomiting.  Estimated Date of Delivery: 03/30/21 Fetal presentation is cephalic.  Length of Stay:  20 Days. Admitted 12/16/2020  Subjective: She remains unchanged today - still with minimal to no PO intake.   Patient reports good fetal movement.  She reports no uterine contractions, no bleeding and no loss of fluid per vagina.   Per her nurse, she has bloody urine. UA done yesterday which was confirmatory for blood. The patient denies noticing any blood with wiping. She has no bleeding at any other time. She denies flank pain and fever. She denies abdominal pain.  She has bloody noses intermittently.   She only otherwise notes bilateral shoulder pain that started today. It hurts to the touch and with movement.   Vitals:  Blood pressure 133/63, pulse 87, temperature 97.9 F (36.6 C), temperature source Oral, resp. rate 16, height 5\' 1"  (1.549 m), weight 69 kg, SpO2 96 %. Physical Examination: CONSTITUTIONAL: Well-developed, well-nourished female in no acute distress.  NEUROLOGIC: Alert and oriented to person, place, and time. No cranial nerve deficit noted. PSYCHIATRIC: Normal mood and flat affect. Normal behavior. Normal judgment and thought content. CARDIOVASCULAR: Normal heart rate noted, regular rhythm RESPIRATORY: Effort and breath sounds normal, no problems with respiration noted MUSCULOSKELETAL: Normal range of motion. No edema and no tenderness. 2+ distal pulses. ABDOMEN: Soft, nontender, nondistended, gravid. CERVIX:  deferred   NST not yet done.   Results for orders placed or performed during the hospital encounter of 12/16/20 (from the past 48 hour(s))  Glucose, capillary     Status: Abnormal   Collection Time: 01/04/21  5:09 AM  Result Value Ref Range   Glucose-Capillary  101 (H) 70 - 99 mg/dL    Comment: Glucose reference range applies only to samples taken after fasting for at least 8 hours.  Urinalysis, Routine w reflex microscopic     Status: Abnormal   Collection Time: 01/04/21 12:14 PM  Result Value Ref Range   Color, Urine AMBER (A) YELLOW    Comment: BIOCHEMICALS MAY BE AFFECTED BY COLOR   APPearance CLOUDY (A) CLEAR   Specific Gravity, Urine 1.014 1.005 - 1.030   pH 8.0 5.0 - 8.0   Glucose, UA NEGATIVE NEGATIVE mg/dL   Hgb urine dipstick MODERATE (A) NEGATIVE   Bilirubin Urine NEGATIVE NEGATIVE   Ketones, ur NEGATIVE NEGATIVE mg/dL   Protein, ur 01/06/21 (A) NEGATIVE mg/dL   Nitrite NEGATIVE NEGATIVE   Leukocytes,Ua TRACE (A) NEGATIVE   RBC / HPF >50 (H) 0 - 5 RBC/hpf   WBC, UA >50 (H) 0 - 5 WBC/hpf   Bacteria, UA NONE SEEN NONE SEEN   Squamous Epithelial / LPF 6-10 0 - 5   WBC Clumps PRESENT     Comment: Performed at [facility], [address], [place], [place]  Comprehensive metabolic panel     Status: Abnormal   Collection Time: 01/04/21  4:45 PM  Result Value Ref Range   Sodium 142 135 - 145 mmol/L   Potassium 5.0 3.5 - 5.1 mmol/L   Chloride 113 (H) 98 - 111 mmol/L   CO2 22 22 - 32 mmol/L   Glucose, Bld 143 (H) 70 - 99 mg/dL    Comment: Glucose reference range applies only to samples taken after fasting for at least 8 hours.  BUN 35 (H) 6 - 20 mg/dL   Creatinine, Ser 1.611.31 (H) 0.44 - 1.00 mg/dL   Calcium 8.4 (L) 8.9 - 10.3 mg/dL   Total Protein 5.6 (L) 6.5 - 8.1 g/dL   Albumin 2.7 (L) 3.5 - 5.0 g/dL   AST 27 15 - 41 U/L   ALT 20 0 - 44 U/L   Alkaline Phosphatase 166 (H) 38 - 126 U/L   Total Bilirubin 1.4 (H) 0.3 - 1.2 mg/dL   GFR, Estimated 57 (L) >60 mL/min    Comment: (NOTE) Calculated using the CKD-EPI Creatinine Equation (2021)    Anion gap 7 5 - 15    Comment: Performed at [facility], [address] [place], [place] [address]  CBC with Differential/Platelet     Status: Abnormal   Collection Time:  01/05/21  5:00 AM  Result Value Ref Range   WBC 4.3 4.0 - 10.5 K/uL   RBC 2.54 (L) 3.87 - 5.11 MIL/uL   Hemoglobin 7.5 (L) 12.0 - 15.0 g/dL   HCT 54.023.8 (L) 98.136.0 - 19.146.0 %   MCV 93.7 80.0 - 100.0 fL   MCH 29.5 26.0 - 34.0 pg   MCHC 31.5 30.0 - 36.0 g/dL   RDW 47.815.8 (H) 29.511.5 - 62.115.5 %   Platelets 159 150 - 400 K/uL   nRBC 0.0 0.0 - 0.2 %   Neutrophils Relative % 66 %   Neutro Abs 2.8 1.7 - 7.7 K/uL   Lymphocytes Relative 21 %   Lymphs Abs 0.9 0.7 - 4.0 K/uL   Monocytes Relative 8 %   Monocytes Absolute 0.4 0.1 - 1.0 K/uL   Eosinophils Relative 4 %   Eosinophils Absolute 0.2 0.0 - 0.5 K/uL   Basophils Relative 1 %   Basophils Absolute 0.0 0.0 - 0.1 K/uL   Immature Granulocytes 0 %   Abs Immature Granulocytes 0.01 0.00 - 0.07 K/uL    Comment: Performed at [facility], [address] [place], [place] [address]  Basic metabolic panel     Status: Abnormal   Collection Time: 01/05/21  5:00 AM  Result Value Ref Range   Sodium 142 135 - 145 mmol/L   Potassium 5.0 3.5 - 5.1 mmol/L   Chloride 114 (H) 98 - 111 mmol/L   CO2 23 22 - 32 mmol/L   Glucose, Bld 114 (H) 70 - 99 mg/dL    Comment: Glucose reference range applies only to samples taken after fasting for at least 8 hours.   BUN 35 (H) 6 - 20 mg/dL   Creatinine, Ser 7.841.28 (H) 0.44 - 1.00 mg/dL   Calcium 8.2 (L) 8.9 - 10.3 mg/dL   GFR, Estimated 58 (L) >60 mL/min    Comment: (NOTE) Calculated using the CKD-EPI Creatinine Equation (2021)    Anion gap 5 5 - 15    Comment: Performed at [facility], [address] [place], [place] [address]  Glucose, capillary     Status: None   Collection Time: 01/05/21  5:26 AM  Result Value Ref Range   Glucose-Capillary 89 70 - 99 mg/dL    Comment: Glucose reference range applies only to samples taken after fasting for at least 8 hours.    US MFM FETAL BPP WO NON STRESS  Result Date: 01/05/2021 ----------------------------------------------------------------------  OBSTETRICS  REPORT                       (Signed Final 01/05/2021 10:14 am) ---------------------------------------------------------------------- Patient  Info  ID #:       [id number]                          D.O.B.:  05/02/1991 (29 yrs)  Name:       Ashley Camacho [facility]                 Visit Date: 01/05/2021 08:02 am ---------------------------------------------------------------------- Performed By  Attending:        Lin Landsman      Ref. Address:     [facility]                    MD                                                             [facility]                                                             [address]                                                             [address]                                                             [address]                                                             [place] [facility] [place]                                                             [address]  Performed By:     Marcellina Millin       [facility]  Referred By:      Ellsworth Lennox  [name] ---------------------------------------------------------------------- Orders  #  Description                           Code        Ordered By  1  [place] MFM FETAL BPP WO NON               76819.01    [facility]     STRESS ----------------------------------------------------------------------  #  Order #                     Accession #                Episode #  1  [id number]                   [id number]                 [id number] ---------------------------------------------------------------------- Indications  [redacted] weeks gestation of pregnancy                Z3A.28  Polyhydramnios, second trimester,              O40.2XX0  antepartum condition or complication, fetus  unspecified  Pre-existing diabetes, type 1, in pregnancy,   O24.012  second trimester  Hyperemesis gravidarum                         O21.0  Anemia during pregnancy in  second trimester    O99.012  Abnormal fetal ultrasound (? liver             O28.9  calcification) ---------------------------------------------------------------------- Fetal Evaluation  Num Of Fetuses:         1  Fetal Heart Rate(bpm):  143  Cardiac Activity:       Observed  Presentation:           Cephalic  Placenta:               Posterior  P. Cord Insertion:      Previously Visualized  Amniotic Fluid  AFI FV:      Polyhydramnios  AFI Sum(cm)     %Tile       Largest Pocket(cm)  25.59           > 97        10.6  RUQ(cm)       RLQ(cm)       LUQ(cm)        LLQ(cm)  10.6          4.36          3.6            7.03 ---------------------------------------------------------------------- Biophysical Evaluation  Amniotic F.V:   Polyhydramnios             F. Tone:        Observed  F. Movement:    Observed                   Score:          8/8  F. Breathing:   Observed ---------------------------------------------------------------------- Gestational Age  Best:          28w 0d     Det. ByMarcella Dubs         EDD:   03/30/21                                      (  08/27/20) ---------------------------------------------------------------------- Anatomy  Diaphragm:             Appears normal         Kidneys:                Appear normal  Stomach:               Appears normal, left   Bladder:                Appears normal                         sided  Abdomen:               Echogenic Focus                         prev seen ---------------------------------------------------------------------- Cervix Uterus Adnexa  Cervix  Not visualized (advanced GA >24wks) ---------------------------------------------------------------------- Impression  Ms. Bullis is hospitalized for management of Type 1 DM and  known polyhydramnios  The biophysical profile was 8/8 with good fetal movement and  polyhydramnios persist.  The focal intrabdominal echogenic focus was again seen.  ---------------------------------------------------------------------- Recommendations  Continue daily fetal testing as previously recommended.  Repeat growth 1-2 weeks. ----------------------------------------------------------------------               Lin Landsman, MD Electronically Signed Final Report   01/05/2021 10:14 am ----------------------------------------------------------------------   Current scheduled medications  Chlorhexidine Gluconate Cloth  6 each Topical Daily   doxylamine (Sleep)  12.5 mg Oral QHS   enoxaparin  70 mg Subcutaneous Q12H   feeding supplement (PROSource TF)  45 mL Per Tube BID   ferrous sulfate  300 mg Per Tube QODAY   folic acid  1 mg Per Tube Daily   hydrALAZINE  25 mg Per Tube Q8H   mouth rinse  15 mL Mouth Rinse q12n4p   metoCLOPramide (REGLAN) injection  5 mg Intravenous Q6H   pantoprazole (PROTONIX) IV  40 mg Intravenous Q12H   prenatal vitamin w/FE, FA  1 tablet Oral Q1200   scopolamine  1 patch Transdermal Q72H   sodium chloride flush  10-40 mL Intracatheter Q12H   sodium chloride flush  3 mL Intravenous Q12H   thiamine  100 mg Per Tube Daily   vitamin B-6  12.5 mg Oral QHS    I have reviewed the patient's current medications.  ASSESSMENT: Active Problems:   Delivery with history of C-section   Anemia affecting pregnancy in second trimester   AKI (acute kidney injury) (HCC)   Hyperemesis   Pregnancy with 24 completed weeks gestation   DKA, type 1, not at goal [facility])   Starvation ketoacidosis   Acute deep vein thrombosis (DVT) of brachial vein of right upper extremity (HCC)   Acute respiratory failure with hypoxia (HCC)   Vitreous hemorrhage of right eye (HCC)   Gastroparesis due to DM (HCC)   Diabetic retinopathy (HCC)   Acute on chronic combined systolic and diastolic CHF (congestive heart failure) (HCC)   Type 1 diabetes mellitus affecting pregnancy in second trimester, antepartum   HFrEF (heart failure with reduced ejection  fraction) (HCC)   [redacted] weeks gestation of pregnancy   Acute respiratory distress   Anemia during pregnancy   PLAN: Hematuria - Will check Urine culture - Will check Renal US - No other source or explanation for bleeding but pt is also on Lovenox  2. Declining HgB and platelets -  Could be due to malnutrition, but pt is also high risk for SIPE - CMP and CBC ordered for tomorrow - She is on iron every other day. Would consider IV iron.  - Blood pressures continue to be normal on Apresoline  3. Fetal well being - Continue NSTs daily - NST reactive today, baseline 130s, + accels - BPP weekly - done today, 8/8. Poly noted. Continued echogenic foci in the liver.  - Last growth 8/1 was 59%ile. Next growth 8/22 and ordered placed for continuation of it Q3 weeks - BMZ not given due to brittle DM  4. Mode of delivery - History of c-section.  Plan is for c-section no later than 34w - Discussed contraception with the patient. She has had an IUD in the past but did not tolerate it. Discussed tubal ligation, Nexplanon. She will consider her options. Reviewed permanent nature of salpingectomy but given her medical history, I recommend she consider this option.   5. T1DM - Managed by Internal medicine currently with Insulin/Endotool  6. Chronic but stable CF - Cardiology following. Echo remains stable, last EF was 50-55% on 8/7.   7. AKI - Cr currently stable at 1.28  8. Chronic N/V - Currently on Reglan, Scop patch, Phenergan scheduled - Compazine, Zofran prn - Tube feeds cortrack   9. H/o right brachial VTE - Lovenox (therapeutic)  10. Shoulder pain - PT for this but also given prolonged hospitalization   Milas Hock, MD, FACOG Obstetrician & Gynecologist, [facility] for [facility], [facility]

## 2021-01-05 NOTE — Progress Notes (Signed)
ANTICOAGULATION CONSULT NOTE - Follow Up Consult  Pharmacy Consult for enoxaparin Indication:  VTE treatment for right brachial DVT  No Known Allergies  Patient Measurements: Height: 5\' 1"  (154.9 cm) Weight: 69 kg (152 lb 3.2 oz) IBW/kg (Calculated) : 47.8 Heparin Dosing Weight: 70.2 kg  Vital Signs: Temp: 97.9 F (36.6 C) (08/15 1118) Temp Source: Oral (08/15 1118) BP: 130/56 (08/15 1418) Pulse Rate: 87 (08/15 1418)  Labs: Recent Labs    01/04/21 1645 01/05/21 0500 01/05/21 1432  HGB  --  7.5*  --   HCT  --  23.8*  --   PLT  --  159  --   HEPRLOWMOCWT  --   --  0.93  CREATININE 1.31* 1.28*  --     Estimated Creatinine Clearance: 57.6 mL/min (A) (by C-G formula based on SCr of 1.28 mg/dL (H)).   Medications:  Scheduled:   Chlorhexidine Gluconate Cloth  6 each Topical Daily   doxylamine (Sleep)  12.5 mg Oral QHS   enoxaparin  70 mg Subcutaneous Q12H   feeding supplement (PROSource TF)  45 mL Per Tube BID   ferrous sulfate  300 mg Per Tube QODAY   folic acid  1 mg Per Tube Daily   hydrALAZINE  25 mg Per Tube Q8H   mouth rinse  15 mL Mouth Rinse q12n4p   metoCLOPramide (REGLAN) injection  5 mg Intravenous Q6H   pantoprazole (PROTONIX) IV  40 mg Intravenous Q12H   prenatal vitamin w/FE, FA  1 tablet Oral Q1200   scopolamine  1 patch Transdermal Q72H   sodium chloride flush  10-40 mL Intracatheter Q12H   sodium chloride flush  3 mL Intravenous Q12H   thiamine  100 mg Per Tube Daily   vitamin B-6  12.5 mg Oral QHS    Assessment: Anti-Xa level 0.93 (~4.4 hrs post-dose) is within goal range. Renal function has maintained at baseline for CKD stage II. No major weight changes. Hgb 7.5 and PLT 159. No s/sx of bleeding.  Goal of Therapy:  Anti-Xa level 0.6-1 units/ml 4hrs after LMWH dose given Monitor platelets by anticoagulation protocol: Yes   Plan:  Lovenox 70mg  BID. Plan to continue monitoring patients. As patient is stable and anti-Xa levels have remained  therapeutic, will obtain repeat level in 1 month unless clinical condition warrants closer follow-up.  01/07/21 Lattie Cervi 01/05/2021,4:00 PM

## 2021-01-05 NOTE — Progress Notes (Signed)
PROGRESS NOTE    EVOLEHT HOVATTER  KAJ:[id number] DOB: 1990/11/15 DOA: 12/16/2020 PCP: Hildred Laser, MD   Chief Complain:Nausea,vomiting  Brief Narrative:  Patient is a 30 year old female G2,P1 at [redacted] weeks gestation who has a history of type 1 diabetes mellitus.  She presented to the emergency room for evaluation of persistent nausea and vomiting .  She was recently with multiple hospitalization, at [facility] for sepsis secondary to pneumonia and was there for 2 weeks.  Couple hospitalization at [facility] due to intractable nausea and vomiting. -Transferred from [facility] to [facility] 7/26 given her severe malnutrition, with hyperemesis gravidarum, for consideration of cortrak tube insertion.  Patient had core track inserted on admission, so far tolerating tube feed (she remains with significant nausea and vomiting, but this is at baseline and did not worsen with initiation of tube feed), as well her work-up was significant for acute CHF, requiring diuresis, cardiology following. We are giving trial of clear liquid diet .  Continues to have persistent nausea and vomiting so not tolerating that much.  Goal is to diet advancement and removal of feeding tube.Currently on clear liquid diet.  Assessment & Plan:   Active Problems:   Delivery with history of C-section   Anemia affecting pregnancy in second trimester   AKI (acute kidney injury) (HCC)   Hyperemesis   Pregnancy with 24 completed weeks gestation   DKA, type 1, not at goal [facility])   Starvation ketoacidosis   Acute deep vein thrombosis (DVT) of brachial vein of right upper extremity (HCC)   Acute respiratory failure with hypoxia (HCC)   Vitreous hemorrhage of right eye (HCC)   Gastroparesis due to DM (HCC)   Diabetic retinopathy (HCC)   Acute on chronic combined systolic and diastolic CHF (congestive heart failure) (HCC)   Type 1 diabetes mellitus affecting pregnancy in second trimester, antepartum   HFrEF (heart failure with  reduced ejection fraction) (HCC)   [redacted] weeks gestation of pregnancy   Acute respiratory distress   Anemia during pregnancy   Intractable nausea/vomiting, severe protein calorie malnutrition, hyperemesis gravidarum/underlying gastroparesis: Presented with persistent nausea and vomiting.  Was on TPN previously which was discontinued on 7/28.  Now being fed through tube.  Tolerating tube feeding.    Was previously started on a steroid for hyperemesis gravidarum but it was discontinued because it did not help.  Currently on Reglan, Zofran, scopolamine, Protonix, Compazine, Phenergan. We are giving trial of clear liquid diet,looks like she is beginning to tolerate.  If she continues to tolerate,we might try full liquid Goal is to remove the tube feeding at some point.  Type 1 diabetes: Uncontrolled with hyperglycemia alternating with hyperglycemia.  On insulin drip.  Follows with endocrinologist and takes insulin at home.Diabetic coordinator were following,.  Cardiomyopathy/acute systolic congestive heart failure: Currently on hydralazine, as needed Lasix. Has severe bilateral lower extremity edema.  Echo done on 12/28/2020 showed ejection fraction of 50 to 55%, small pericardial effusion.  Echo done on 7/22 showed ejection fraction of 40 to 45%.  Cardiology following.  Lasix on hold due to AKI. Bilateral lower extremity edema could have been contributed due to pregnancy, hypoalbuminemia. Cardiology planning for limited echo this week.  Elevated BNp  Right upper extremity DVT: On Lovenox  AKI on CKD stage II: Hospital course remarkable for AKI, diuresis on hold.  Continue to monitor.Creatinine  has plateaued around 1.2  Normocytic anemia: Most likely  dilutional secondary to pregnancy hypervolemia/hypoalbuminemia.  Continue to monitor  Second trimester  pregnancy: 27 weeks.  OB-GYN following.  Maternal-fetal medicine recommended daily fetal testing, monthly growth ultrasounds, delivery at 34 weeks, no  antenatal corticosteroids.      Nutrition Problem: Inadequate oral intake Etiology: nausea, vomiting      DVT prophylaxis:Lovenox Code Status: Full Family Communication: Boyfriend at the bedside on 8.8.22 Status is: Inpatient  Remains inpatient appropriate because:Inpatient level of care appropriate due to severity of illness  Dispo: The patient is from: Home              Anticipated d/c is to: Home              Patient currently is not medically stable to d/c.   Difficult to place patient No  Unable to discharge the patient until she is able to tolerate solid oral food/removal of feeding tube   Consultants: Cardiology, GYN  Procedures: None  Antimicrobials:  Anti-infectives (From admission, onward)    None       Subjective: Patient seen and examined at the bedside this morning.  Hemodynamically stable.  She still nauseous.  She was able to tolerate chicken broth.  I have encouraged her to try to take clear liquid diet so that we can advance to full if she tolerates  Objective: Vitals:   01/04/21 2030 01/04/21 2330 01/05/21 0500 01/05/21 0618  BP:  (!) 128/56 (!) 119/56   Pulse:  91 89   Resp:  18 18   Temp:  98.5 F (36.9 C) 97.6 F (36.4 C)   TempSrc:  Oral Oral   SpO2: 93% 96% 96%   Weight:    69 kg  Height:         Intake/Output Summary (Last 24 hours) at 01/05/2021 0726 Last data filed at 01/05/2021 0600 Gross per 24 hour  Intake 2310.73 ml  Output 2250 ml  Net 60.73 ml   Filed Weights   01/03/21 0517 01/04/21 0535 01/05/21 0618  Weight: 68.7 kg 69.7 kg 69 kg    Examination: General exam: Overall comfortable, not in distress HEENT: PERRL Respiratory system:  no wheezes or crackles  Cardiovascular system: S1 & S2 heard, RRR.  Gastrointestinal system: Abdomen is nondistended, soft and nontender. Central nervous system: Alert and oriented Extremities: B/L lower extremity pitting edema, no clubbing ,no cyanosis Skin: No rashes, no ulcers,no  icterus    Data Reviewed: I have personally reviewed following labs and imaging studies  CBC: Recent Labs  Lab 12/30/20 0400 01/01/21 1536 01/05/21 0500  WBC 5.3 4.7 4.3  NEUTROABS 3.7  --  2.8  HGB 7.6* 8.1* 7.5*  HCT 24.5* 25.5* 23.8*  MCV 93.2 92.7 93.7  PLT 181 174 159   Basic Metabolic Panel: Recent Labs  Lab 12/31/20 0815 01/01/21 1536 01/02/21 0540 01/04/21 1645 01/05/21 0500  NA 142 141 142 142 142  K 4.5 4.8 4.8 5.0 5.0  CL 112* 112* 109 113* 114*  CO2 24 23 23 22 23   GLUCOSE 90 88 93 143* 114*  BUN 34* 35* 35* 35* 35*  CREATININE 1.25* 1.26* 1.27* 1.31* 1.28*  CALCIUM 8.3* 8.4* 8.8* 8.4* 8.2*   GFR: Estimated Creatinine Clearance: 57.6 mL/min (A) (by C-G formula based on SCr of 1.28 mg/dL (H)). Liver Function Tests: Recent Labs  Lab 01/01/21 1536 01/04/21 1645  AST 24 27  ALT 18 20  ALKPHOS 153* 166*  BILITOT 0.8 1.4*  PROT 5.8* 5.6*  ALBUMIN 2.9* 2.7*   No results for input(s): LIPASE, AMYLASE in the last 168 hours. No  results for input(s): AMMONIA in the last 168 hours. Coagulation Profile: No results for input(s): INR, PROTIME in the last 168 hours. Cardiac Enzymes: No results for input(s): CKTOTAL, CKMB, CKMBINDEX, TROPONINI in the last 168 hours. BNP (last 3 results) No results for input(s): PROBNP in the last 8760 hours. HbA1C: No results for input(s): HGBA1C in the last 72 hours. CBG: Recent Labs  Lab 01/02/21 1456 01/02/21 1534 01/03/21 0517 01/04/21 0509 01/05/21 0526  GLUCAP 69* 135* 86 101* 89   Lipid Profile: No results for input(s): CHOL, HDL, LDLCALC, TRIG, CHOLHDL, LDLDIRECT in the last 72 hours. Thyroid Function Tests: No results for input(s): TSH, T4TOTAL, FREET4, T3FREE, THYROIDAB in the last 72 hours. Anemia Panel: No results for input(s): VITAMINB12, FOLATE, FERRITIN, TIBC, IRON, RETICCTPCT in the last 72 hours. Sepsis Labs: No results for input(s): PROCALCITON, LATICACIDVEN in the last 168 hours.  No results  found for this or any previous visit (from the past 240 hour(s)).        Radiology Studies: No results found.      Scheduled Meds:  Chlorhexidine Gluconate Cloth  6 each Topical Daily   doxylamine (Sleep)  12.5 mg Oral QHS   enoxaparin  70 mg Subcutaneous Q12H   feeding supplement (PROSource TF)  45 mL Per Tube BID   ferrous sulfate  300 mg Per Tube QODAY   folic acid  1 mg Per Tube Daily   hydrALAZINE  25 mg Per Tube Q8H   mouth rinse  15 mL Mouth Rinse q12n4p   metoCLOPramide (REGLAN) injection  5 mg Intravenous Q6H   pantoprazole sodium  40 mg Per Tube BID   prenatal vitamin w/FE, FA  1 tablet Oral Q1200   scopolamine  1 patch Transdermal Q72H   sodium chloride flush  10-40 mL Intracatheter Q12H   sodium chloride flush  3 mL Intravenous Q12H   thiamine  100 mg Per Tube Daily   vitamin B-6  12.5 mg Oral QHS   Continuous Infusions:  sodium chloride Stopped (12/19/20 1700)   feeding supplement (OSMOLITE 1.5 CAL) 1,000 mL (01/04/21 2324)   insulin 1.4 Units/hr (01/05/21 0617)   lactated ringers 10 mL/hr at 01/04/21 1116   promethazine (PHENERGAN)25mg  in 68mL NS 25 mg (01/05/21 0349)     LOS: 20 days    Time spent:25 mins. More than 50% of that time was spent in counseling and/or coordination of care.      Burnadette Pop, MD [facility] P8/15/2022, 7:26 AM

## 2021-01-05 NOTE — Progress Notes (Signed)
Nutrition Follow-up  DOCUMENTATION CODES:  Not applicable  INTERVENTION:  Unable to tolerate C/L past 2 days  Continues on Osmolite 1.5 at  goal rate of 50 ml/hr.   45 ml Prosource TF BID.    Tube feeding regimen provides 1880 kcal (100% of est needs for repletion), 97 grams of protein, and 914 ml of free H2O.   Additional IVF of 0.9% sodium chloride at 10 ml/hr  25(OH)D level 15 ng/ml on 7/16 - consider Vitamin D supplements  Additional supplements: PNV w/ iron q day Thiamine 100  mg q day Iron ( 60 mg) Folic acid 1 mg q day B6  Continues with baseline emesis   Weight essentially stable, same as 8/7. Weight has fluctuated with fluid status  NUTRITION DIAGNOSIS:  Inadequate oral intake related to nausea, vomiting as evidenced by NPO status. Ongoing  GOAL:  Patient will meet greater than or equal to 90% of their needs Weight gain  MONITOR:  Diet advancement, Labs, Weight trends, TF tolerance, Skin, I & O's  REASON FOR ASSESSMENT:  Enteral support/ tubefeedings  ASSESSMENT:  30 y.o. female with medical history  of [redacted]w[redacted]d on admission , now 28 0/7 weeks , Type 1 diabetic, Cardiomyopathy, chronic systolic. Multiple recent admissions for hyperemesis/ pneumonia [facility] )  7/27- cortrak placed- post-pyloric placement - remains in place w/ TF tol well   Lab Results  Component Value Date   HGBA1C 5.8 (H) 11/21/2020    Diet Order:   Diet Order             Diet clear liquid Room service appropriate? Yes; Fluid consistency: Thin  Diet effective now                  EDUCATION NEEDS:   No education needs have been identified at this time  Skin:  Skin Assessment: Reviewed RN Assessment  Last BM:  no stool recorded for several days  Height:   Ht Readings from Last 1 Encounters:  12/17/20 5\' 1"  (1.549 m)    Weight:   Wt Readings from Last 1 Encounters:  01/05/21 69 kg    Ideal Body Weight:  47.7 kg  BMI:  Body mass index is 28.76 kg/m.  Estimated  Nutritional Needs:   Kcal:  1700-1900  Protein:  90-105 grams  Fluid:  > 1.7 L  01/07/21 M.M LDN Neonatal Nutrition Support Specialist/RD III

## 2021-01-05 NOTE — Progress Notes (Signed)
Notified Dr. Para March regarding pt's dark red urine. MD en route to department to evaluate. OB urine culture sent as ordered.

## 2021-01-06 LAB — CBC
HCT: 26.1 % — ABNORMAL LOW (ref 36.0–46.0)
Hemoglobin: 8.2 g/dL — ABNORMAL LOW (ref 12.0–15.0)
MCH: 29.9 pg (ref 26.0–34.0)
MCHC: 31.4 g/dL (ref 30.0–36.0)
MCV: 95.3 fL (ref 80.0–100.0)
Platelets: 177 10*3/uL (ref 150–400)
RBC: 2.74 MIL/uL — ABNORMAL LOW (ref 3.87–5.11)
RDW: 15.9 % — ABNORMAL HIGH (ref 11.5–15.5)
WBC: 5 10*3/uL (ref 4.0–10.5)
nRBC: 0 % (ref 0.0–0.2)

## 2021-01-06 LAB — COMPREHENSIVE METABOLIC PANEL
ALT: 20 U/L (ref 0–44)
AST: 28 U/L (ref 15–41)
Albumin: 2.6 g/dL — ABNORMAL LOW (ref 3.5–5.0)
Alkaline Phosphatase: 163 U/L — ABNORMAL HIGH (ref 38–126)
Anion gap: 7 (ref 5–15)
BUN: 34 mg/dL — ABNORMAL HIGH (ref 6–20)
CO2: 23 mmol/L (ref 22–32)
Calcium: 8.6 mg/dL — ABNORMAL LOW (ref 8.9–10.3)
Chloride: 112 mmol/L — ABNORMAL HIGH (ref 98–111)
Creatinine, Ser: 1.36 mg/dL — ABNORMAL HIGH (ref 0.44–1.00)
GFR, Estimated: 54 mL/min — ABNORMAL LOW (ref >60)
Glucose, Bld: 101 mg/dL — ABNORMAL HIGH (ref 70–99)
Potassium: 4.8 mmol/L (ref 3.5–5.1)
Sodium: 142 mmol/L (ref 135–145)
Total Bilirubin: 1.5 mg/dL — ABNORMAL HIGH (ref 0.3–1.2)
Total Protein: 5.6 g/dL — ABNORMAL LOW (ref 6.5–8.1)

## 2021-01-06 LAB — GLUCOSE, CAPILLARY
Glucose-Capillary: 93 mg/dL (ref 70–99)
Glucose-Capillary: 117 mg/dL — ABNORMAL HIGH (ref 70–99)

## 2021-01-06 MED ORDER — AMOXICILLIN 250 MG PO CHEW: 500.0000 mg | CHEWABLE_TABLET | Freq: Three times a day (TID) | ORAL | Status: DC

## 2021-01-06 MED ORDER — AMOXICILLIN 250 MG/5ML PO SUSR
500.0000 mg | Freq: Three times a day (TID) | ORAL | Status: AC
  Administered 2021-01-06 – 2021-01-13 (×21): 500 mg
  Filled 2021-01-06 (×27): qty 10

## 2021-01-06 NOTE — Progress Notes (Signed)
PROGRESS NOTE    Ashley Camacho  PNT:[id number] DOB: 1990/12/06 DOA: 12/16/2020 PCP: Hildred Laser, MD   Chief Complain:Nausea,vomiting  Brief Narrative:  Patient is a 30 year old female G2,P1 at [redacted] weeks gestation who has a history of type 1 diabetes mellitus.  She presented to the emergency room for evaluation of persistent nausea and vomiting .  She was recently with multiple hospitalization, at [facility] for sepsis secondary to pneumonia and was there for 2 weeks.  Couple hospitalization at [facility] due to intractable nausea and vomiting. -Transferred from [facility] to [facility] 7/26 given her severe malnutrition, with hyperemesis gravidarum, for consideration of cortrak tube insertion.  Patient had core track inserted on admission, so far tolerating tube feed (she remains with significant nausea and vomiting, but this is at baseline and did not worsen with initiation of tube feed), as well her work-up was significant for acute CHF, requiring diuresis, cardiology following. We are giving trial of clear liquid diet .  Continues to have persistent nausea and vomiting so not tolerating that much.  Goal is to diet advancement and removal of feeding tube.Currently on clear liquid diet.  Assessment & Plan:   Active Problems:   Delivery with history of C-section   Anemia affecting pregnancy in second trimester   AKI (acute kidney injury) (HCC)   Hyperemesis   Pregnancy with 24 completed weeks gestation   Diabetes type 1, uncontrolled (HCC)   Starvation ketoacidosis   Acute deep vein thrombosis (DVT) of brachial vein of right upper extremity (HCC)   Acute respiratory failure with hypoxia (HCC)   Vitreous hemorrhage of right eye (HCC)   Gastroparesis due to DM (HCC)   Diabetic retinopathy (HCC)   Acute on chronic combined systolic and diastolic CHF (congestive heart failure) (HCC)   Type 1 diabetes mellitus affecting pregnancy in second trimester, antepartum   HFrEF (heart failure  with reduced ejection fraction) (HCC)   [redacted] weeks gestation of pregnancy   Acute respiratory distress   Anemia during pregnancy   Pregnancy complicated by pre-existing type 1 diabetes in third trimester   Intractable nausea/vomiting, severe protein calorie malnutrition, hyperemesis gravidarum/underlying gastroparesis: Presented with persistent nausea and vomiting.  Was on TPN previously which was discontinued on 7/28.  Now being fed through tube.  Tolerating tube feeding.    Was previously started on a steroid for hyperemesis gravidarum but it was discontinued because it did not help.  Currently on Reglan, Zofran, scopolamine, Protonix, Compazine, Phenergan.Also on pyridoxine. We are giving trial of clear liquid diet,looks like she is beginning to tolerate.  If she continues to tolerate,we might try full liquid.I have asked RN to check on that Goal is to remove the tube feeding at some point.  Type 1 diabetes: Uncontrolled with hyperglycemia alternating with hyperglycemia.  On insulin drip.  Follows with endocrinologist and takes insulin at home.Diabetic coordinator were following,.  Cardiomyopathy/acute systolic congestive heart failure: Currently on hydralazine Has severe bilateral lower extremity edema.  Echo done on 12/28/2020 showed ejection fraction of 50 to 55%, small pericardial effusion.  Echo done on 7/22 showed ejection fraction of 40 to 45%.  Cardiology following.  Lasix on hold due to AKI. Bilateral lower extremity edema could have been contributed due to pregnancy, hypoalbuminemia.  Right upper extremity DVT: On Lovenox  AKI on CKD stage II: Hospital course remarkable for AKI, diuresis on hold.  Continue to monitor.Creatinine  has plateaued around 1.2  Normocytic anemia: Most likely  dilutional secondary to pregnancy hypervolemia/hypoalbuminemia.  Continue to monitor  Dark urine: Renal ultrasound did not show any hydronephrosis, not renal stones.  Second trimester pregnancy: 28  weeks.  OB-GYN following.  Maternal-fetal medicine recommended daily fetal testing, monthly growth ultrasounds, delivery at 34 weeks, no antenatal corticosteroids.      Nutrition Problem: Inadequate oral intake Etiology: nausea, vomiting      DVT prophylaxis:Lovenox Code Status: Full Family Communication: Boyfriend at the bedside on 8.8.22 Status is: Inpatient  Remains inpatient appropriate because:Inpatient level of care appropriate due to severity of illness  Dispo: The patient is from: Home              Anticipated d/c is to: Home              Patient currently is not medically stable to d/c.   Difficult to place patient No  Unable to discharge the patient until she is able to tolerate solid oral food/removal of feeding tube   Consultants: Cardiology, GYN  Procedures: None  Antimicrobials:  Anti-infectives (From admission, onward)    None       Subjective: Patient seen and examined the bedside this morning.  Hemodynamically stable.  She had an episode of vomiting last night but not this morning.  Tolerated clear liquid diet today.  Our goal is to start on full liquid diet if she continues to tolerate clear liquid  Objective: Vitals:   01/05/21 1418 01/05/21 1927 01/05/21 2326 01/06/21 0422  BP: (!) 130/56 131/61 131/64 (!) 132/59  Pulse: 87 88 89 87  Resp:  18 18 16   Temp:  98.2 F (36.8 C) 97.7 F (36.5 C) 98.2 F (36.8 C)  TempSrc:  Oral Axillary Oral  SpO2:      Weight:    69.4 kg  Height:         Intake/Output Summary (Last 24 hours) at 01/06/2021 0727 Last data filed at 01/06/2021 0422 Gross per 24 hour  Intake 1225.43 ml  Output 1300 ml  Net -74.57 ml   Filed Weights   01/04/21 0535 01/05/21 0618 01/06/21 0422  Weight: 69.7 kg 69 kg 69.4 kg    Examination: General exam: Overall comfortable, not in distress,weak appearing HEENT: PERRL Respiratory system:  no wheezes or crackles  Cardiovascular system: S1 & S2 heard, RRR.   Gastrointestinal system: Abdomen is nondistended, soft and nontender. Central nervous system: Alert and oriented Extremities: Bilateral lower extremity pitting edema, no clubbing ,no cyanosis Skin: No rashes, no ulcers,no icterus    Data Reviewed: I have personally reviewed following labs and imaging studies  CBC: Recent Labs  Lab 01/01/21 1536 01/05/21 0500 01/06/21 0450  WBC 4.7 4.3 5.0  NEUTROABS  --  2.8  --   HGB 8.1* 7.5* 8.2*  HCT 25.5* 23.8* 26.1*  MCV 92.7 93.7 95.3  PLT 174 159 177   Basic Metabolic Panel: Recent Labs  Lab 01/01/21 1536 01/02/21 0540 01/04/21 1645 01/05/21 0500 01/06/21 0450  NA 141 142 142 142 142  K 4.8 4.8 5.0 5.0 4.8  CL 112* 109 113* 114* 112*  CO2 23 23 22 23 23   GLUCOSE 88 93 143* 114* 101*  BUN 35* 35* 35* 35* 34*  CREATININE 1.26* 1.27* 1.31* 1.28* 1.36*  CALCIUM 8.4* 8.8* 8.4* 8.2* 8.6*   GFR: Estimated Creatinine Clearance: 54.3 mL/min (A) (by C-G formula based on SCr of 1.36 mg/dL (H)). Liver Function Tests: Recent Labs  Lab 01/01/21 1536 01/04/21 1645 01/06/21 0450  AST 24 27 28   ALT 18 20 20  ALKPHOS 153* 166* 163*  BILITOT 0.8 1.4* 1.5*  PROT 5.8* 5.6* 5.6*  ALBUMIN 2.9* 2.7* 2.6*   No results for input(s): LIPASE, AMYLASE in the last 168 hours. No results for input(s): AMMONIA in the last 168 hours. Coagulation Profile: No results for input(s): INR, PROTIME in the last 168 hours. Cardiac Enzymes: No results for input(s): CKTOTAL, CKMB, CKMBINDEX, TROPONINI in the last 168 hours. BNP (last 3 results) No results for input(s): PROBNP in the last 8760 hours. HbA1C: No results for input(s): HGBA1C in the last 72 hours. CBG: Recent Labs  Lab 01/03/21 0517 01/04/21 0509 01/05/21 0526 01/05/21 2333 01/06/21 0423  GLUCAP 86 101* 89 118* 93   Lipid Profile: No results for input(s): CHOL, HDL, LDLCALC, TRIG, CHOLHDL, LDLDIRECT in the last 72 hours. Thyroid Function Tests: No results for input(s): TSH,  T4TOTAL, FREET4, T3FREE, THYROIDAB in the last 72 hours. Anemia Panel: No results for input(s): VITAMINB12, FOLATE, FERRITIN, TIBC, IRON, RETICCTPCT in the last 72 hours. Sepsis Labs: No results for input(s): PROCALCITON, LATICACIDVEN in the last 168 hours.  No results found for this or any previous visit (from the past 240 hour(s)).        Radiology Studies: US RENAL  Result Date: 01/05/2021 CLINICAL DATA:  Hematuria. Elevated creatinine in a [redacted] week pregnant female. EXAM: RENAL / URINARY TRACT ULTRASOUND COMPLETE COMPARISON:  None FINDINGS: Right Kidney: Renal measurements: 11.9 x 5.2 x 5.5 cm = volume: 179 mL. Moderate RIGHT-sided hydronephrosis. No perinephric fluid. No visible calculus. Left Kidney: Renal measurements: 9.7 x 3.9 x 4.3 cm = volume: 83.2 mL. Echogenicity within normal limits. No mass or hydronephrosis visualized. Bladder: Bladder is unremarkable with bilateral ureteral jets seen. Gravid uterus adjacent urinary bladder partially imaged Other: None. IMPRESSION: Moderate RIGHT hydronephrosis in the setting of pregnancy potentially physiologic related to uterine compression. Bilateral ureteral jets are identified on the current evaluation. No visible renal calculi.  Ureters are not assessed. Electronically Signed   By: Donzetta Kohut M.D.   On: 01/05/2021 17:19   [place] MFM FETAL BPP WO NON STRESS  Result Date: 01/05/2021 ----------------------------------------------------------------------  OBSTETRICS REPORT                       (Signed Final 01/05/2021 10:14 am) ---------------------------------------------------------------------- Patient Info  ID #:       [id number]                          D.O.B.:  January 02, 1991 (29 yrs)  Name:       Ashley Camacho [facility]                 Visit Date: 01/05/2021 08:02 am ---------------------------------------------------------------------- Performed By  Attending:        Lin Landsman      Ref. Address:     [facility]                    MD                                                              [facility]  [address]                                                             [address]                                                             [address]                                                             [place]                                                             [address]  Performed By:     Marcellina Millin       [facility]  Referred By:      Linzie Collin MD ---------------------------------------------------------------------- Orders  #  Description                           Code        Ordered By  1  [place] MFM FETAL BPP WO NON               [id number]    Jaynie Collins     STRESS ----------------------------------------------------------------------  #  Order #                     Accession #                Episode #  1  [id number]                   [id number]                 [id number] ---------------------------------------------------------------------- Indications  [redacted] weeks gestation of pregnancy                Z3A.28  Polyhydramnios, second trimester,              O40.2XX0  antepartum condition or complication, fetus  unspecified  Pre-existing diabetes, type 1, in pregnancy,   O24.012  second trimester  Hyperemesis gravidarum  O21.0  Anemia during pregnancy in second trimester    O99.012  Abnormal fetal ultrasound (? liver             O28.9  calcification) ---------------------------------------------------------------------- Fetal Evaluation  Num Of Fetuses:         1  Fetal Heart Rate(bpm):  143  Cardiac Activity:       Observed  Presentation:           Cephalic  Placenta:               Posterior  P. Cord Insertion:      Previously Visualized  Amniotic Fluid  AFI FV:      Polyhydramnios  AFI Sum(cm)     %Tile       Largest Pocket(cm)  25.59            > 97        10.6  RUQ(cm)       RLQ(cm)       LUQ(cm)        LLQ(cm)  10.6          4.36          3.6            7.03 ---------------------------------------------------------------------- Biophysical Evaluation  Amniotic F.V:   Polyhydramnios             F. Tone:        Observed  F. Movement:    Observed                   Score:          8/8  F. Breathing:   Observed ---------------------------------------------------------------------- Gestational Age  Best:          28w 0d     Det. By:  Marcella Dubs         EDD:   03/30/21                                      (08/27/20) ---------------------------------------------------------------------- Anatomy  Diaphragm:             Appears normal         Kidneys:                Appear normal  Stomach:               Appears normal, left   Bladder:                Appears normal                         sided  Abdomen:               Echogenic Focus                         prev seen ---------------------------------------------------------------------- Cervix Uterus Adnexa  Cervix  Not visualized (advanced GA >24wks) ---------------------------------------------------------------------- Impression  Ms. Pociask is hospitalized for management of Type 1 DM and  known polyhydramnios  The biophysical profile was 8/8 with good fetal movement and  polyhydramnios persist.  The focal intrabdominal echogenic focus was again seen. ---------------------------------------------------------------------- Recommendations  Continue daily fetal testing as previously recommended.  Repeat growth 1-2 weeks. ----------------------------------------------------------------------               Lin Landsman, MD  Electronically Signed Final Report   01/05/2021 10:14 am ----------------------------------------------------------------------       Scheduled Meds:  Chlorhexidine Gluconate Cloth  6 each Topical Daily   doxylamine (Sleep)  12.5 mg Oral QHS   enoxaparin  70 mg  Subcutaneous Q12H   feeding supplement (PROSource TF)  45 mL Per Tube BID   ferrous sulfate  300 mg Per Tube QODAY   folic acid  1 mg Per Tube Daily   hydrALAZINE  25 mg Per Tube Q8H   mouth rinse  15 mL Mouth Rinse q12n4p   metoCLOPramide (REGLAN) injection  5 mg Intravenous Q6H   pantoprazole (PROTONIX) IV  40 mg Intravenous Q12H   prenatal vitamin w/FE, FA  1 tablet Oral Q1200   scopolamine  1 patch Transdermal Q72H   sodium chloride flush  10-40 mL Intracatheter Q12H   sodium chloride flush  3 mL Intravenous Q12H   thiamine  100 mg Per Tube Daily   vitamin B-6  12.5 mg Oral QHS   Continuous Infusions:  sodium chloride Stopped (12/19/20 1700)   feeding supplement (OSMOLITE 1.5 CAL) 1,000 mL (01/05/21 2124)   insulin 1 Units/hr (01/06/21 0519)   lactated ringers 10 mL/hr at 01/04/21 1116   promethazine (PHENERGAN)25mg  in 50mL NS 25 mg (01/05/21 2129)     LOS: 21 days    Time spent:25 mins. More than 50% of that time was spent in counseling and/or coordination of care.      Burnadette Pop, MD [facility] P8/16/2022, 7:27 AM

## 2021-01-06 NOTE — Progress Notes (Signed)
FACULTY PRACTICE ANTEPARTUM COMPREHENSIVE PROGRESS NOTE  Ashley Camacho is a 30 y.o. G2P0101 at [redacted]w[redacted]d who is admitted for chronic N/V in s/o T1DM, CHTN, malnutrition and anemia in s/o  intractable nausea/vomiting.   Estimated Date of Delivery: 03/30/21 Fetal presentation is cephalic.  Length of Stay:  21 Days. Admitted 12/16/2020  Subjective: She denies any changes today in her medical status. She kept some PO down yesterday but minimal. Still has emesis around the cortrak. She denies abdominal pain. She denies HA/BV/RUQ pain. She has continued shoulder pain that is unchanged and both arms. Continued bloody noses but using the spray.   She denies any change in her urine color. She denies any urinary symptoms. She has no flank pain or fever.   Patient reports good fetal movement.  She reports no uterine contractions, no bleeding and no loss of fluid per vagina.  Vitals:  Blood pressure (!) 127/57, pulse 91, temperature 97.7 F (36.5 C), temperature source Oral, resp. rate 16, height 5\' 1"  (1.549 m), weight 69.4 kg, SpO2 97 %. Physical Examination: CONSTITUTIONAL: Well-developed, well-nourished female in no acute distress.  NEUROLOGIC: Alert and oriented to person, place, and time. No cranial nerve deficit noted. PSYCHIATRIC: Normal mood and flat affect. Normal behavior. Normal judgment and thought content. CARDIOVASCULAR: Normal heart rate noted, regular rhythm RESPIRATORY: Effort and breath sounds normal, no problems with respiration noted MUSCULOSKELETAL: Normal range of motion. Moderate edema and no tenderness. 2+ distal pulses. ABDOMEN: Soft, nontender, nondistended, gravid. CERVIX:  Deferred  Fetal monitoring: NST not yet done. 8/15 was 130s, reactive, + accels, no decels. Appropriate for gestational age  Uterine activity: no contractions   CBC Latest Ref Rng & Units 01/06/2021 01/05/2021 01/01/2021  WBC 4.0 - 10.5 K/uL 5.0 4.3 4.7  Hemoglobin 12.0 - 15.0 g/dL 8.2(L) 7.5(L) 8.1(L)   Hematocrit 36.0 - 46.0 % 26.1(L) 23.8(L) 25.5(L)  Platelets 150 - 400 K/uL 177 159 174   CMP Latest Ref Rng & Units 01/06/2021 01/05/2021 01/04/2021  Glucose 70 - 99 mg/dL 956(O) 130(Q) 657(Q)  BUN 6 - 20 mg/dL 46(N) 62(X) 52(W)  Creatinine 0.44 - 1.00 mg/dL 4.13(K) 4.40(N) 0.27(O)  Sodium 135 - 145 mmol/L 142 142 142  Potassium 3.5 - 5.1 mmol/L 4.8 5.0 5.0  Chloride 98 - 111 mmol/L 112(H) 114(H) 113(H)  CO2 22 - 32 mmol/L 23 23 22   Calcium 8.9 - 10.3 mg/dL 5.3(G) 6.4(Q) 0.3(K)  Total Protein 6.5 - 8.1 g/dL 7.4(Q) - 5.6(L)  Total Bilirubin 0.3 - 1.2 mg/dL 5.9(D) - 1.4(H)  Alkaline Phos 38 - 126 U/L 163(H) - 166(H)  AST 15 - 41 U/L 28 - 27  ALT 0 - 44 U/L 20 - 20     US RENAL  Result Date: 01/05/2021 CLINICAL DATA:  Hematuria. Elevated creatinine in a [redacted] week pregnant female. EXAM: RENAL / URINARY TRACT ULTRASOUND COMPLETE COMPARISON:  None FINDINGS: Right Kidney: Renal measurements: 11.9 x 5.2 x 5.5 cm = volume: 179 mL. Moderate RIGHT-sided hydronephrosis. No perinephric fluid. No visible calculus. Left Kidney: Renal measurements: 9.7 x 3.9 x 4.3 cm = volume: 83.2 mL. Echogenicity within normal limits. No mass or hydronephrosis visualized. Bladder: Bladder is unremarkable with bilateral ureteral jets seen. Gravid uterus adjacent urinary bladder partially imaged Other: None. IMPRESSION: Moderate RIGHT hydronephrosis in the setting of pregnancy potentially physiologic related to uterine compression. Bilateral ureteral jets are identified on the current evaluation. No visible renal calculi.  Ureters are not assessed. Electronically Signed   By: Jason Fila.D.  On: 01/05/2021 17:19   US MFM FETAL BPP WO NON STRESS  Result Date: 01/05/2021 ----------------------------------------------------------------------  OBSTETRICS REPORT                       (Signed Final 01/05/2021 10:14 am) ---------------------------------------------------------------------- Patient Info  ID #:       [id number]                           D.O.B.:  [birth year] (29 yrs)  Name[facility]       [facility]                 Visit Date: 01/05/2021 08:02 am ---------------------------------------------------------------------- Performed By  Attending:        Lin Landsmanorenthian Booker      Ref. Address:     [facility]                    MD                                                             [facility]                                                             [address]                                                             [address]                                                             [address]                                                             [place]                                                             [id number]  Performed By:     Marcellina MillinKatherine Speake       [facility]  Referred By:      Linzie Collin MD ---------------------------------------------------------------------- Orders  #  Description                           Code        Ordered By  1  [place] MFM FETAL BPP WO NON               [id number]    Jaynie Collins     STRESS ----------------------------------------------------------------------  #  Order #                     Accession #                Episode #  1  [id number]                   [id number]                 [id number] ---------------------------------------------------------------------- Indications  [redacted] weeks gestation of pregnancy                Z3A.28  Polyhydramnios, second trimester,              O40.2XX0  antepartum condition or complication, fetus  unspecified  Pre-existing diabetes, type 1, in pregnancy,   O24.012  second trimester  Hyperemesis gravidarum                         O21.0  Anemia during pregnancy in second trimester    O99.012  Abnormal fetal ultrasound (? liver             O28.9  calcification) ---------------------------------------------------------------------- Fetal  Evaluation  Num Of Fetuses:         1  Fetal Heart Rate(bpm):  143  Cardiac Activity:       Observed  Presentation:           Cephalic  Placenta:               Posterior  P. Cord Insertion:      Previously Visualized  Amniotic Fluid  AFI FV:      Polyhydramnios  AFI Sum(cm)     %Tile       Largest Pocket(cm)  25.59           > 97        10.6  RUQ(cm)       RLQ(cm)       LUQ(cm)        LLQ(cm)  10.6          4.36          3.6            7.03 ---------------------------------------------------------------------- Biophysical Evaluation  Amniotic F.V:   Polyhydramnios             F. Tone:        Observed  F. Movement:    Observed                   Score:          8/8  F. Breathing:   Observed ---------------------------------------------------------------------- Gestational Age  Best:          28w 0d     Det. By:  Marcella Dubs  EDD:   03/30/21                                      (08/27/20) ---------------------------------------------------------------------- Anatomy  Diaphragm:             Appears normal         Kidneys:                Appear normal  Stomach:               Appears normal, left   Bladder:                Appears normal                         sided  Abdomen:               Echogenic Focus                         prev seen ---------------------------------------------------------------------- Cervix Uterus Adnexa  Cervix  Not visualized (advanced GA >24wks) ---------------------------------------------------------------------- Impression  Ms. Swader is hospitalized for management of Type 1 DM and  known polyhydramnios  The biophysical profile was 8/8 with good fetal movement and  polyhydramnios persist.  The focal intrabdominal echogenic focus was again seen. ---------------------------------------------------------------------- Recommendations  Continue daily fetal testing as previously recommended.  Repeat growth 1-2 weeks. ----------------------------------------------------------------------                Lin Landsman, MD Electronically Signed Final Report   01/05/2021 10:14 am ----------------------------------------------------------------------   Current scheduled medications  Chlorhexidine Gluconate Cloth  6 each Topical Daily   doxylamine (Sleep)  12.5 mg Oral QHS   enoxaparin  70 mg Subcutaneous Q12H   feeding supplement (PROSource TF)  45 mL Per Tube BID   ferrous sulfate  300 mg Per Tube QODAY   folic acid  1 mg Per Tube Daily   hydrALAZINE  25 mg Per Tube Q8H   mouth rinse  15 mL Mouth Rinse q12n4p   metoCLOPramide (REGLAN) injection  5 mg Intravenous Q6H   pantoprazole (PROTONIX) IV  40 mg Intravenous Q12H   prenatal vitamin w/FE, FA  1 tablet Oral Q1200   scopolamine  1 patch Transdermal Q72H   sodium chloride flush  10-40 mL Intracatheter Q12H   sodium chloride flush  3 mL Intravenous Q12H   thiamine  100 mg Per Tube Daily   vitamin B-6  12.5 mg Oral QHS    I have reviewed the patient's current medications.  ASSESSMENT: Active Problems:   Delivery with history of C-section   Anemia affecting pregnancy in second trimester   AKI (acute kidney injury) (HCC)   Hyperemesis   Pregnancy with 24 completed weeks gestation   Diabetes type 1, uncontrolled (HCC)   Starvation ketoacidosis   Acute deep vein thrombosis (DVT) of brachial vein of right upper extremity (HCC)   Acute respiratory failure with hypoxia (HCC)   Vitreous hemorrhage of right eye (HCC)   Gastroparesis due to DM (HCC)   Diabetic retinopathy (HCC)   Acute on chronic combined systolic and diastolic CHF (congestive heart failure) (HCC)   Type 1 diabetes mellitus affecting pregnancy in second trimester, antepartum   HFrEF (heart failure with reduced ejection fraction) (HCC)   [redacted] weeks gestation of pregnancy   Acute respiratory distress  Anemia during pregnancy   Pregnancy complicated by pre-existing type 1 diabetes in third trimester   PLAN: Routine Pregnancy - Continue routine care  for pregnancy - will offer Tdap tomorrow   Fetal Well Being - Continue NST daily - BPP weekly, done 8/8 with continued poly and echogenic foci in the liver - Growth due next on 8/22. Last growth on 8/1 was 59%ile - BMZ not given due to brittle DM  Mode of Delivery - Plan is for c-section no later than 34w but sooner if clinical indicated I.e. PreE - At this time she declines tubal ligation - Declines IUD - had poor reaction in the past. Discussed Nexplanon. She will consider.   Chronic N/V - Scheduled Reglan, Scop patch and phenergan - Prn Compazine and zofran - Tube feeds via cortrack  T1DM - Managed by Internal Medicine with Insulin/Endotool  Hematuria - Renal US negative for stones and other abnormality - Ucx pending. UA was positive for blood and RBCs - Urine with less blood today. Appears largely like hemolyzed blood  Anemia - Stable today at 8.2 - She has required transfusion in the past in this pregnancy  AKI - Stable per primary team, Cr. 1.36 today  H/o right brachial VTE - therapeutic lovenox  10. Chronic but stable CF - Cardiology signed off. Last Echo 8/7 was EF 50-55%    Continue routine antenatal care.   Milas Hock, MD, FACOG Obstetrician & Gynecologist, [facility] for [facility], [facility]

## 2021-01-06 NOTE — Progress Notes (Signed)
Progress Note  Patient Name: Ashley Camacho Date of Encounter: 01/06/2021  [facility] Cardiologist: Ashley FairMihai Croitoru, MD   Subjective   Sleeping comfortably.  Nurses in room.  Cortrak nasogastric in place  Inpatient Medications    Scheduled Meds:  Chlorhexidine Gluconate Cloth  6 each Topical Daily   doxylamine (Sleep)  12.5 mg Oral QHS   enoxaparin  70 mg Subcutaneous Q12H   feeding supplement (PROSource TF)  45 mL Per Tube BID   ferrous sulfate  300 mg Per Tube QODAY   folic acid  1 mg Per Tube Daily   hydrALAZINE  25 mg Per Tube Q8H   mouth rinse  15 mL Mouth Rinse q12n4p   metoCLOPramide (REGLAN) injection  5 mg Intravenous Q6H   pantoprazole (PROTONIX) IV  40 mg Intravenous Q12H   prenatal vitamin w/FE, FA  1 tablet Oral Q1200   scopolamine  1 patch Transdermal Q72H   sodium chloride flush  10-40 mL Intracatheter Q12H   sodium chloride flush  3 mL Intravenous Q12H   thiamine  100 mg Per Tube Daily   vitamin B-6  12.5 mg Oral QHS   Continuous Infusions:  sodium chloride Stopped (12/19/20 1700)   feeding supplement (OSMOLITE 1.5 CAL) 1,000 mL (01/05/21 2124)   insulin 1.5 Units/hr (01/06/21 1042)   lactated ringers 10 mL/hr at 01/04/21 1116   promethazine (PHENERGAN)25mg  in 50mL NS 25 mg (01/05/21 2129)   PRN Meds: sodium chloride, acetaminophen **OR** acetaminophen, dextrose, diphenhydrAMINE-zinc acetate, guaiFENesin-dextromethorphan, hydrocortisone cream, loperamide, ondansetron, phenol, prochlorperazine, promethazine (PHENERGAN)25mg  in 50mL NS, sodium chloride, sodium chloride flush, sodium chloride flush   Vital Signs    Vitals:   01/05/21 1927 01/05/21 2326 01/06/21 0422 01/06/21 0743  BP: 131/61 131/64 (!) 132/59 (!) 127/57  Pulse: 88 89 87 91  Resp: 18 18 16 16   Temp: 98.2 F (36.8 C) 97.7 F (36.5 C) 98.2 F (36.8 C) 97.7 F (36.5 C)  TempSrc: Oral Axillary Oral Oral  SpO2:    97%  Weight:   69.4 kg   Height:        Intake/Output Summary  (Last 24 hours) at 01/06/2021 1043 Last data filed at 01/06/2021 0945 Gross per 24 hour  Intake 1155.44 ml  Output 1250 ml  Net -94.56 ml   Last 3 Weights 01/06/2021 01/05/2021 01/04/2021  Weight (lbs) 152 lb 14.4 oz 152 lb 3.2 oz 153 lb 11.2 oz  Weight (kg) 69.355 kg 69.037 kg 69.718 kg      Telemetry    Sinus rhythm.  No adverse arrhythmias detected.- Personally Reviewed  ECG    No new- Personally Reviewed  Physical Exam   GEN: No acute distress.  Sleeping comfortably, easily arousable Neck: No JVD, Cortrak nasogastric in place Cardiac: RRR, no murmurs, rubs, or gallops.  Respiratory: Clear to auscultation bilaterally. GI: Soft, nontender, non-distended  MS: No edema; No deformity. Neuro:  Nonfocal  Psych: Normal affect   Labs    High Sensitivity Troponin:   Recent Labs  Lab 12/27/20 1322 12/27/20 1550  TROPONINIHS 8 8      Chemistry Recent Labs  Lab 01/01/21 1536 01/02/21 0540 01/04/21 1645 01/05/21 0500 01/06/21 0450  NA 141   < > 142 142 142  K 4.8   < > 5.0 5.0 4.8  CL 112*   < > 113* 114* 112*  CO2 23   < > 22 23 23   GLUCOSE 88   < > 143* 114* 101*  BUN 35*   < >  35* 35* 34*  CREATININE 1.26*   < > 1.31* 1.28* 1.36*  CALCIUM 8.4*   < > 8.4* 8.2* 8.6*  PROT 5.8*  --  5.6*  --  5.6*  ALBUMIN 2.9*  --  2.7*  --  2.6*  AST 24  --  27  --  28  ALT 18  --  20  --  20  ALKPHOS 153*  --  166*  --  163*  BILITOT 0.8  --  1.4*  --  1.5*  GFRNONAA 59*   < > 57* 58* 54*  ANIONGAP 6   < > 7 5 7    < > = values in this interval not displayed.     Hematology Recent Labs  Lab 01/01/21 1536 01/05/21 0500 01/06/21 0450  WBC 4.7 4.3 5.0  RBC 2.75* 2.54* 2.74*  HGB 8.1* 7.5* 8.2*  HCT 25.5* 23.8* 26.1*  MCV 92.7 93.7 95.3  MCH 29.5 29.5 29.9  MCHC 31.8 31.5 31.4  RDW 15.5 15.8* 15.9*  PLT 174 159 177    BNP Recent Labs  Lab 01/02/21 0500  BNP 512.1*     DDimer No results for input(s): DDIMER in the last 168 hours.   Radiology    03/04/21  RENAL  Result Date: 01/05/2021 CLINICAL DATA:  Hematuria. Elevated creatinine in a [redacted] week pregnant female. EXAM: RENAL / URINARY TRACT ULTRASOUND COMPLETE COMPARISON:  None FINDINGS: Right Kidney: Renal measurements: 11.9 x 5.2 x 5.5 cm = volume: 179 mL. Moderate RIGHT-sided hydronephrosis. No perinephric fluid. No visible calculus. Left Kidney: Renal measurements: 9.7 x 3.9 x 4.3 cm = volume: 83.2 mL. Echogenicity within normal limits. No mass or hydronephrosis visualized. Bladder: Bladder is unremarkable with bilateral ureteral jets seen. Gravid uterus adjacent urinary bladder partially imaged Other: None. IMPRESSION: Moderate RIGHT hydronephrosis in the setting of pregnancy potentially physiologic related to uterine compression. Bilateral ureteral jets are identified on the current evaluation. No visible renal calculi.  Ureters are not assessed. Electronically Signed   By: 01/07/2021 M.D.   On: 01/05/2021 17:19   01/07/2021 MFM FETAL BPP WO NON STRESS  Result Date: 01/05/2021 ----------------------------------------------------------------------  OBSTETRICS REPORT                       (Signed Final 01/05/2021 10:14 am) ---------------------------------------------------------------------- Patient Info  ID #:       01/07/2021                          D.O.B.:  [birth year] (29 yrs)  Name:       Ashley Camacho                 Visit Date: 01/05/2021 08:02 am ---------------------------------------------------------------------- Performed By  Attending:        01/07/2021      Ref. Address:     [facility]                    MD                                                             [facility]  [address]                                                             [address]                                                             [address]                                                             [place]                                                              [id number]  Performed By:     Ashley Camacho       [facility]  Referred By:      Ashley Collin MD ---------------------------------------------------------------------- Orders  #  Description                           Code        Ordered By  1  [place] MFM FETAL BPP WO NON               [id number]    Jaynie Collins     STRESS ----------------------------------------------------------------------  #  Order #                     Accession #                Episode #  1  [id number]                   [id number]                 [id number] ---------------------------------------------------------------------- Indications  [redacted] weeks gestation of pregnancy                Z3A.28  Polyhydramnios, second trimester,              O40.2XX0  antepartum condition or complication, fetus  unspecified  Pre-existing diabetes, type 1, in pregnancy,   O24.012  second trimester  Hyperemesis gravidarum  O21.0  Anemia during pregnancy in second trimester    O99.012  Abnormal fetal ultrasound (? liver             O28.9  calcification) ---------------------------------------------------------------------- Fetal Evaluation  Num Of Fetuses:         1  Fetal Heart Rate(bpm):  143  Cardiac Activity:       Observed  Presentation:           Cephalic  Placenta:               Posterior  P. Cord Insertion:      Previously Visualized  Amniotic Fluid  AFI FV:      Polyhydramnios  AFI Sum(cm)     %Tile       Largest Pocket(cm)  25.59           > 97        10.6  RUQ(cm)       RLQ(cm)       LUQ(cm)        LLQ(cm)  10.6          4.36          3.6            7.03 ---------------------------------------------------------------------- Biophysical Evaluation  Amniotic F.V:   Polyhydramnios             F. Tone:        Observed  F. Movement:    Observed                   Score:          8/8  F. Breathing:    Observed ---------------------------------------------------------------------- Gestational Age  Best:          28w 0d     Det. By:  Marcella Dubs         EDD:   03/30/21                                      (08/27/20) ---------------------------------------------------------------------- Anatomy  Diaphragm:             Appears normal         Kidneys:                Appear normal  Stomach:               Appears normal, left   Bladder:                Appears normal                         sided  Abdomen:               Echogenic Focus                         prev seen ---------------------------------------------------------------------- Cervix Uterus Adnexa  Cervix  Not visualized (advanced GA >24wks) ---------------------------------------------------------------------- Impression  Ms. Dilworth is hospitalized for management of Type 1 DM and  known polyhydramnios  The biophysical profile was 8/8 with good fetal movement and  polyhydramnios persist.  The focal intrabdominal echogenic focus was again seen. ---------------------------------------------------------------------- Recommendations  Continue daily fetal testing as previously recommended.  Repeat growth 1-2 weeks. ----------------------------------------------------------------------               Lin Landsman, MD  Electronically Signed Final Report   01/05/2021 10:14 am ----------------------------------------------------------------------   Cardiac Studies   Last echocardiogram showed ejection fraction of 50 to 55% improved from 40 to 45%.  Patient Profile     30 y.o. female [redacted] weeks pregnant with diabetes hypertension malnutrition hyperemesis who presented with acute decompensated combined systolic and diastolic heart failure.  Assessment & Plan    -Ejection fraction has improved to low normal 50 to 55%.  Excellent. -Current plan is probable C-section around 34 weeks.  Would anticipate approximately 3 to 4 pound weight gain during that  time which need to be accounted for when assessing volume status. -Has been auto diuresing.  No Lasix at this time, prior AKI. -Small pericardial effusion shows no hemodynamic compromise. -Hypertension well controlled on hydralazine. -Would be comfortable with discontinuation of telemetry given overall stability, sinus rhythm demonstrated over several days.  No changes at this time.  We will go ahead and sign off.  Please let us know if we can be of further assistance.   For questions or updates, please contact [facility] Please consult [email or web address] for contact info under        Signed, Donato Schultz, MD  01/06/2021, 10:43 AM

## 2021-01-06 NOTE — Progress Notes (Signed)
Telemetry discontinued per order

## 2021-01-06 NOTE — Progress Notes (Signed)
Physical Therapy Treatment Patient Details Name: Ashley Camacho MRN: [id number] DOB: February 27, 1991 Today's Date: 01/06/2021    History of Present Illness 30 y.o. 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 Medical history significant of 101w5d on presentation, Estimated Date of Delivery: 03/30/21 by by 9 week ultrasound however trying to make it to 34w for planned c-section, Type 1 diabetic, Cardiomyopathy, chronic systolic heart failure, DVT in bilateral arm. H/o recent hospitalizations d/t hyperemesis and sepsis secondary to pneumonia.    PT Comments    Pt actively vomiting just before and during session today.  RN aware and medicating.  Pt was able to walk a loop around the unit, sitting once on the 4 wheeled walker with a seat to rest.  It was nice to have the seat for immediate rest vs trying to find an available chair, so I would continue to recommend bringing the rollator on next sessions.  She was able to complete stated UE and LE exercises, and new to me reporting bil shoulder pain (unable to lift arms above head against gravity, very limited tolerance of AAROM exercises below 90 degrees of flexion).  RN aware of shoulder pain and offering tylenol and heat packs.  PT will continue to follow acutely for safe mobility progression.   Follow Up Recommendations  No PT follow up     Equipment Recommendations  None recommended by PT    Recommendations for Other Services       Precautions / Restrictions Precautions Precautions: Other (comment) Precaution Comments: frequent vomiting, cotrak. h/o RUE DVT (BPs taken in legs)    Mobility  Bed Mobility Overal bed mobility: Needs Assistance Bed Mobility: Sit to Supine       Sit to supine: Mod assist   General bed mobility comments: Pt having difficulty with transition back to bed due to painful bil shoulders.     Transfers Overall transfer level: Modified independent               General transfer comment: took a bit of rocking, slow to get up, but able to do so without external help.  Ambulation/Gait Ambulation/Gait assistance: Supervision Gait Distance (Feet): 75 Feet (x2 seated rest on rollator) Assistive device: 4-wheeled walker;IV Pole Gait Pattern/deviations: Step-through pattern;Wide base of support (pt reports thighs are sticking together bil LE edema) Gait velocity: decr Gait velocity interpretation: <1.31 ft/sec, indicative of household ambulator General Gait Details: Pt fatigues quickly, but did like having the rollator to sit down half way around the loop during gait.  She pushed IV pole initially and second half of walked pushed the rollator. She liked having some light support.   Stairs             Wheelchair Mobility    Modified Rankin (Stroke Patients Only)       Balance Overall balance assessment: Mild deficits observed, not formally tested                                          Cognition Arousal/Alertness: Awake/alert Behavior During Therapy: Flat affect Overall Cognitive Status: Within Functional Limits for tasks assessed  Exercises General Exercises - Upper Extremity Shoulder Flexion: AAROM;Both;10 reps Elbow Flexion: AROM;Both;10 reps General Exercises - Lower Extremity Ankle Circles/Pumps: AROM;Both;10 reps Long Arc Quad: AROM;Both;10 reps Hip Flexion/Marching: AROM;Both;10 reps    General Comments        Pertinent Vitals/Pain Pain Assessment: Faces Faces Pain Scale: Hurts even more Pain Location: bil shoulders Pain Descriptors / Indicators: Grimacing;Guarding Pain Intervention(s): Limited activity within patient's tolerance;Monitored during session;Repositioned;Patient requesting pain meds-RN notified    Home Living                      Prior  Function            PT Goals (current goals can now be found in the care plan section) Progress towards PT goals: Progressing toward goals    Frequency    Min 2X/week      PT Plan Current plan remains appropriate    Co-evaluation              AM-PAC PT "6 Clicks" Mobility   Outcome Measure  Help needed turning from your back to your side while in a flat bed without using bedrails?: None Help needed moving from lying on your back to sitting on the side of a flat bed without using bedrails?: None Help needed moving to and from a bed to a chair (including a wheelchair)?: None Help needed standing up from a chair using your arms (e.g., wheelchair or bedside chair)?: None Help needed to walk in hospital room?: A Little Help needed climbing 3-5 steps with a railing? : A Little 6 Click Score: 22    End of Session   Activity Tolerance: Patient limited by pain;Patient limited by fatigue Patient left: in bed;with call bell/phone within reach;with nursing/sitter in room Nurse Communication: Other (comment);Patient requests pain meds (pt vomiting, would like nausea meds) PT Visit Diagnosis: Other abnormalities of gait and mobility (R26.89);Muscle weakness (generalized) (M62.81)     Time: 7672-0947 PT Time Calculation (min) (ACUTE ONLY): 31 min  Charges:  $Gait Training: 8-22 mins $Therapeutic Exercise: 8-22 mins                     Corinna Capra, PT, DPT  Acute Rehabilitation Ortho Tech Supervisor [phone] pager [phone]) [phone] office

## 2021-01-07 DIAGNOSIS — Z3A28 28 weeks gestation of pregnancy: Secondary | ICD-10-CM

## 2021-01-07 DIAGNOSIS — O09899 Supervision of other high risk pregnancies, unspecified trimester: Secondary | ICD-10-CM

## 2021-01-07 LAB — CULTURE, OB URINE: Culture: >=100000 — AB

## 2021-01-07 LAB — GLUCOSE, CAPILLARY: Glucose-Capillary: 93 mg/dL (ref 70–99)

## 2021-01-07 MED ORDER — TETANUS-DIPHTH-ACELL PERTUSSIS 5-2.5-18.5 LF-MCG/0.5 IM SUSY
0.5000 mL | PREFILLED_SYRINGE | Freq: Once | INTRAMUSCULAR | Status: AC
  Administered 2021-01-08: 0.5 mL via INTRAMUSCULAR
  Filled 2021-01-07: qty 0.5

## 2021-01-07 NOTE — Progress Notes (Signed)
Occupational Therapy Treatment Patient Details Name: Ashley Camacho MRN: [id number] DOB: [birth year] Today's Date: 01/07/2021    History of present illness 30 y.o. 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 Medical history significant of [redacted]w[redacted]d on presentation, Estimated Date of Delivery: 03/30/21 by by 9 week ultrasound however trying to make it to 34w for planned c-section, Type 1 diabetic, Cardiomyopathy, chronic systolic heart failure, DVT in bilateral arm. H/o recent hospitalizations d/t hyperemesis and sepsis secondary to pneumonia.   OT comments  Focus of session on B shoulder/scapular exercises and posture. Pt ambulated with IV pole to bathroom and remained at EOB at end of session with blinds up. Pt more interactive than previous session.    Follow Up Recommendations  No OT follow up;Supervision/Assistance - 24 hour    Equipment Recommendations  None recommended by OT    Recommendations for Other Services      Precautions / Restrictions Precautions Precautions: Other (comment) Precaution Comments: frequent vomiting, cotrak. h/o RUE DVT (BPs taken in legs)       Mobility Bed Mobility         Supine to sit: Modified independent (Device/Increase time);HOB elevated     General bed mobility comments: seated at EOB at end of session    Transfers Overall transfer level: Modified independent Equipment used: None             General transfer comment: slow to rise with minimal momentum    Balance Overall balance assessment: Mild deficits observed, not formally tested                                         ADL either performed or assessed with clinical judgement   ADL                           Toilet Transfer: Ambulation;Supervision/safety   Toileting- Clothing Manipulation and Hygiene: Sit to/from  stand;Supervision/safety       Functional mobility during ADLs: Supervision/safety (pushing IV pole)       Vision       Perception     Praxis      Cognition Arousal/Alertness: Awake/alert Behavior During Therapy: Flat affect Overall Cognitive Status: Within Functional Limits for tasks assessed                                          Exercises Exercises: Other exercises Other Exercises Other Exercises: shoulder rolls, scapular adduction, arm pendulums all x 10 followed by towel exercises on overbed table.   Shoulder Instructions       General Comments      Pertinent Vitals/ Pain       Pain Assessment: Faces Faces Pain Scale: Hurts even more Pain Location: bil shoulders Pain Descriptors / Indicators: Grimacing;Guarding Pain Intervention(s): Monitored during session;Patient requesting pain meds-RN notified  Home Living                                          Prior Functioning/Environment              Frequency  Min 2X/week  Progress Toward Goals  OT Goals(current goals can now be found in the care plan section)  Progress towards OT goals: Progressing toward goals  Acute Rehab OT Goals Patient Stated Goal: to take a shower OT Goal Formulation: With patient Time For Goal Achievement: 01/15/21 Potential to Achieve Goals: Good  Plan Discharge plan remains appropriate    Co-evaluation                 AM-PAC OT "6 Clicks" Daily Activity     Outcome Measure   Help from another person eating meals?: None Help from another person taking care of personal grooming?: A Little Help from another person toileting, which includes using toliet, bedpan, or urinal?: A Little Help from another person bathing (including washing, rinsing, drying)?: A Little Help from another person to put on and taking off regular upper body clothing?: A Little Help from another person to put on and taking off regular lower body  clothing?: A Little 6 Click Score: 19    End of Session    OT Visit Diagnosis: Muscle weakness (generalized) (M62.81);Pain;Feeding difficulties (R63.3)   Activity Tolerance Patient tolerated treatment well   Patient Left with call bell/phone within reach (EOB)   Nurse Communication Patient requests pain meds        Time: 1550-1609 OT Time Calculation (min): 19 min  Charges: OT General Charges $OT Visit: 1 Visit OT Treatments $Therapeutic Exercise: 8-22 mins  Martie Round, OTR/L Acute Rehabilitation Services Pager: [phone] Office: [phone]    Ashley Camacho 01/07/2021, 4:09 PM

## 2021-01-07 NOTE — Progress Notes (Signed)
PROGRESS NOTE    Ashley Camacho  FWY:[id number] DOB: 1991/02/09 DOA: 12/16/2020 PCP: Hildred Laser, MD   Chief Complain:Nausea,vomiting  Brief Narrative:  Patient is a 30 year old female G2,P1 at [redacted] weeks gestation who has a history of type 1 diabetes mellitus.  She presented to the emergency room for evaluation of persistent nausea and vomiting .  She was recently with multiple hospitalization, at [facility] for sepsis secondary to pneumonia and was there for 2 weeks.  Couple hospitalization at [facility] due to intractable nausea and vomiting. -Transferred from [facility] to [facility] 7/26 given her severe malnutrition, with hyperemesis gravidarum, for consideration of cortrak tube insertion.  Patient had core track inserted on admission, so far tolerating tube feed (she remains with significant nausea and vomiting, but this is at baseline and did not worsen with initiation of tube feed), as well her work-up was significant for acute CHF, requiring diuresis, cardiology following. We are giving trial of clear liquid diet .  Continues to have intermittent  nausea and vomiting . Goal is to diet advancement and removal of feeding tube.Currently attemtping on full liquid diet.  Assessment & Plan:   Active Problems:   Delivery with history of C-section   Anemia affecting pregnancy in second trimester   AKI (acute kidney injury) (HCC)   Hyperemesis   Pregnancy with 24 completed weeks gestation   Diabetes type 1, uncontrolled (HCC)   Starvation ketoacidosis   Acute deep vein thrombosis (DVT) of brachial vein of right upper extremity (HCC)   Acute respiratory failure with hypoxia (HCC)   Vitreous hemorrhage of right eye (HCC)   Gastroparesis due to DM (HCC)   Diabetic retinopathy (HCC)   Acute on chronic combined systolic and diastolic CHF (congestive heart failure) (HCC)   Type 1 diabetes mellitus affecting pregnancy in second trimester, antepartum   HFrEF (heart failure with reduced  ejection fraction) (HCC)   [redacted] weeks gestation of pregnancy   Acute respiratory distress   Anemia during pregnancy   Pregnancy complicated by pre-existing type 1 diabetes in third trimester   Intractable nausea/vomiting, severe protein calorie malnutrition, hyperemesis gravidarum/underlying gastroparesis: Presented with persistent nausea and vomiting.  Was on TPN previously which was discontinued on 7/28.  Now being fed through tube.  Tolerating tube feeding.    Was previously started on a steroid for hyperemesis gravidarum but it was discontinued because it did not help.  Currently on Reglan, Zofran, scopolamine, Protonix, Compazine, Phenergan.Also on pyridoxine. She was tolerating clear liquid diet so full liquid diet has been ordered. Goal is to remove the tube feeding after she adequately tolerates solid food,might take long time  Type 1 diabetes: Uncontrolled with hyperglycemia alternating with hyperglycemia.  On insulin drip.  Follows with endocrinologist and takes insulin at home.Diabetic coordinator were following,.  Cardiomyopathy/acute systolic congestive heart failure: Currently on hydralazine Has severe bilateral lower extremity edema.  Echo done on 12/28/2020 showed ejection fraction of 50 to 55%, small pericardial effusion.  Echo done on 7/22 showed ejection fraction of 40 to 45%.  Cardiology following.  Lasix on hold due to AKI. Bilateral lower extremity edema could have been contributed due to pregnancy, hypoalbuminemia.  Right upper extremity DVT: On Lovenox  AKI on CKD stage II: Hospital course remarkable for AKI, diuresis on hold.  Continue to monitor.Creatinine  has plateaued around 1.2  Normocytic anemia: Most likely  dilutional secondary to pregnancy hypervolemia/hypoalbuminemia.  Continue to monitor  Dark urine: Renal ultrasound did not show any hydronephrosis, no renal  stones.  Urine culture showed Enterococcus faecalis, currently on amoxicillin.  Second trimester  pregnancy: 28 weeks.  OB-GYN following.  Maternal-fetal medicine recommended daily fetal testing, monthly growth ultrasounds, delivery at 34 weeks, no antenatal corticosteroids.      Nutrition Problem: Inadequate oral intake Etiology: nausea, vomiting      DVT prophylaxis:Lovenox Code Status: Full Family Communication: Boyfriend at the bedside on 8.8.22 Status is: Inpatient  Remains inpatient appropriate because:Inpatient level of care appropriate due to severity of illness  Dispo: The patient is from: Home              Anticipated d/c is to: Home              Patient currently is not medically stable to d/c.   Difficult to place patient No  Unable to discharge the patient until she is able to tolerate solid oral food/removal of feeding tube   Consultants: Cardiology, GYN  Procedures: None  Antimicrobials:  Anti-infectives (From admission, onward)    Start     Dose/Rate Route Frequency Ordered Stop   01/06/21 1630  amoxicillin (AMOXIL) 250 MG/5ML suspension 500 mg        500 mg Per Tube Every 8 hours 01/06/21 1534 01/13/21 1359   01/06/21 1615  amoxicillin (AMOXIL) chewable tablet 500 mg  Status:  Discontinued        500 mg Oral Every 8 hours 01/06/21 1528 01/06/21 1532       Subjective: Patient seen and examined the bedside this morning.  Hemodynamically stable.  Did not vomit last night.  She states she will try full liquid diet today  Objective: Vitals:   01/06/21 1929 01/07/21 0004 01/07/21 0403 01/07/21 0530  BP: (!) 124/54 (!) 115/57 (!) 108/48   Pulse: 92 90 89   Resp: 18 18 18    Temp: 98 F (36.7 C) 97.6 F (36.4 C) 98.3 F (36.8 C)   TempSrc: Oral Oral Oral   SpO2: 98% 97% 97%   Weight:    70.8 kg  Height:         Intake/Output Summary (Last 24 hours) at 01/07/2021 0740 Last data filed at 01/07/2021 0618 Gross per 24 hour  Intake 2070.13 ml  Output 1300 ml  Net 770.13 ml   Filed Weights   01/05/21 0618 01/06/21 0422 01/07/21 0530  Weight:  69 kg 69.4 kg 70.8 kg    Examination: General exam: Overall comfortable, not in distress,weak HEENT: PERRL Respiratory system:  no wheezes or crackles  Cardiovascular system: S1 & S2 heard, RRR.  Gastrointestinal system: Abdomen is nondistended, soft and nontender. Central nervous system: Alert and oriented Extremities: Bilateral lower extremity pitting pedal edema, no clubbing ,no cyanosis Skin: No rashes, no ulcers,no icterus    Data Reviewed: I have personally reviewed following labs and imaging studies  CBC: Recent Labs  Lab 01/01/21 1536 01/05/21 0500 01/06/21 0450  WBC 4.7 4.3 5.0  NEUTROABS  --  2.8  --   HGB 8.1* 7.5* 8.2*  HCT 25.5* 23.8* 26.1*  MCV 92.7 93.7 95.3  PLT 174 159 177   Basic Metabolic Panel: Recent Labs  Lab 01/01/21 1536 01/02/21 0540 01/04/21 1645 01/05/21 0500 01/06/21 0450  NA 141 142 142 142 142  K 4.8 4.8 5.0 5.0 4.8  CL 112* 109 113* 114* 112*  CO2 23 23 22 23 23   GLUCOSE 88 93 143* 114* 101*  BUN 35* 35* 35* 35* 34*  CREATININE 1.26* 1.27* 1.31* 1.28* 1.36*  CALCIUM 8.4*  8.8* 8.4* 8.2* 8.6*   GFR: Estimated Creatinine Clearance: 54.9 mL/min (A) (by C-G formula based on SCr of 1.36 mg/dL (H)). Liver Function Tests: Recent Labs  Lab 01/01/21 1536 01/04/21 1645 01/06/21 0450  AST 24 27 28   ALT 18 20 20   ALKPHOS 153* 166* 163*  BILITOT 0.8 1.4* 1.5*  PROT 5.8* 5.6* 5.6*  ALBUMIN 2.9* 2.7* 2.6*   No results for input(s): LIPASE, AMYLASE in the last 168 hours. No results for input(s): AMMONIA in the last 168 hours. Coagulation Profile: No results for input(s): INR, PROTIME in the last 168 hours. Cardiac Enzymes: No results for input(s): CKTOTAL, CKMB, CKMBINDEX, TROPONINI in the last 168 hours. BNP (last 3 results) No results for input(s): PROBNP in the last 8760 hours. HbA1C: No results for input(s): HGBA1C in the last 72 hours. CBG: Recent Labs  Lab 01/05/21 0526 01/05/21 2333 01/06/21 0423 01/06/21 1648  01/07/21 0608  GLUCAP 89 118* 93 117* 93   Lipid Profile: No results for input(s): CHOL, HDL, LDLCALC, TRIG, CHOLHDL, LDLDIRECT in the last 72 hours. Thyroid Function Tests: No results for input(s): TSH, T4TOTAL, FREET4, T3FREE, THYROIDAB in the last 72 hours. Anemia Panel: No results for input(s): VITAMINB12, FOLATE, FERRITIN, TIBC, IRON, RETICCTPCT in the last 72 hours. Sepsis Labs: No results for input(s): PROCALCITON, LATICACIDVEN in the last 168 hours.  Recent Results (from the past 240 hour(s))  OB Urine Culture     Status: Abnormal (Preliminary result)   Collection Time: 01/05/21  2:32 PM   Specimen: Urine, Random  Result Value Ref Range Status   Specimen Description URINE, RANDOM  Final   Special Requests NONE  Final   Culture (A)  Final    >=100,000 COLONIES/mL ENTEROCOCCUS FAECALIS SUSCEPTIBILITIES TO FOLLOW NO GROUP B STREP (S.AGALACTIAE) ISOLATED Performed at [facility], [address] [place], [address] [place]    Report Status PENDING  Incomplete          Radiology Studies: [place] RENAL  Result Date: 01/05/2021 CLINICAL DATA:  Hematuria. Elevated creatinine in a [redacted] week pregnant female. EXAM: RENAL / URINARY TRACT ULTRASOUND COMPLETE COMPARISON:  None FINDINGS: Right Kidney: Renal measurements: 11.9 x 5.2 x 5.5 cm = volume: 179 mL. Moderate RIGHT-sided hydronephrosis. No perinephric fluid. No visible calculus. Left Kidney: Renal measurements: 9.7 x 3.9 x 4.3 cm = volume: 83.2 mL. Echogenicity within normal limits. No mass or hydronephrosis visualized. Bladder: Bladder is unremarkable with bilateral ureteral jets seen. Gravid uterus adjacent urinary bladder partially imaged Other: None. IMPRESSION: Moderate RIGHT hydronephrosis in the setting of pregnancy potentially physiologic related to uterine compression. Bilateral ureteral jets are identified on the current evaluation. No visible renal calculi.  Ureters are not assessed. Electronically Signed   By:  [place] M.D.   On: 01/05/2021 17:19   Donzetta Kohut MFM FETAL BPP WO NON STRESS  Result Date: 01/05/2021 ----------------------------------------------------------------------  OBSTETRICS REPORT                       (Signed Final 01/05/2021 10:14 am) ---------------------------------------------------------------------- Patient Info  ID #:       01/07/2021                          D.O.B.:  1990/06/18 (29 yrs)  Name:       PATRICIA FARGO [facility]                 Visit Date: 01/05/2021 08:02 am ----------------------------------------------------------------------  Performed By  Attending:        Lin Landsman      Ref. Address:     [facility]                    MD                                                             [facility]                                                             [address]                                                             [address]                                                             [address]                                                             [place]                                                             [address]  Performed By:     Marcellina Millin       [facility]  Referred By:      Linzie Collin MD ---------------------------------------------------------------------- Orders  #  Description                           Code        Ordered By  1  [place] MFM FETAL BPP WO NON               [phone]  Jaynie Collins     STRESS ----------------------------------------------------------------------  #  Order #                     Accession #                Episode #  1  [id number]                   [id number]                 [id number] ---------------------------------------------------------------------- Indications  [redacted] weeks gestation of pregnancy                Z3A.28  Polyhydramnios, second trimester,              O40.2XX0  antepartum condition or  complication, fetus  unspecified  Pre-existing diabetes, type 1, in pregnancy,   O24.012  second trimester  Hyperemesis gravidarum                         O21.0  Anemia during pregnancy in second trimester    O99.012  Abnormal fetal ultrasound (? liver             O28.9  calcification) ---------------------------------------------------------------------- Fetal Evaluation  Num Of Fetuses:         1  Fetal Heart Rate(bpm):  143  Cardiac Activity:       Observed  Presentation:           Cephalic  Placenta:               Posterior  P. Cord Insertion:      Previously Visualized  Amniotic Fluid  AFI FV:      Polyhydramnios  AFI Sum(cm)     %Tile       Largest Pocket(cm)  25.59           > 97        10.6  RUQ(cm)       RLQ(cm)       LUQ(cm)        LLQ(cm)  10.6          4.36          3.6            7.03 ---------------------------------------------------------------------- Biophysical Evaluation  Amniotic F.V:   Polyhydramnios             F. Tone:        Observed  F. Movement:    Observed                   Score:          8/8  F. Breathing:   Observed ---------------------------------------------------------------------- Gestational Age  Best:          28w 0d     Det. ByMarcella Dubs         EDD:   03/30/21                                      (08/27/20) ---------------------------------------------------------------------- Anatomy  Diaphragm:             Appears normal         Kidneys:                Appear normal  Stomach:  Appears normal, left   Bladder:                Appears normal                         sided  Abdomen:               Echogenic Focus                         prev seen ---------------------------------------------------------------------- Cervix Uterus Adnexa  Cervix  Not visualized (advanced GA >24wks) ---------------------------------------------------------------------- Impression  Ms. Kitto is hospitalized for management of Type 1 DM and  known polyhydramnios  The biophysical  profile was 8/8 with good fetal movement and  polyhydramnios persist.  The focal intrabdominal echogenic focus was again seen. ---------------------------------------------------------------------- Recommendations  Continue daily fetal testing as previously recommended.  Repeat growth 1-2 weeks. ----------------------------------------------------------------------               Lin Landsman, MD Electronically Signed Final Report   01/05/2021 10:14 am ----------------------------------------------------------------------       Scheduled Meds:  amoxicillin  500 mg Per Tube Q8H   Chlorhexidine Gluconate Cloth  6 each Topical Daily   doxylamine (Sleep)  12.5 mg Oral QHS   enoxaparin  70 mg Subcutaneous Q12H   feeding supplement (PROSource TF)  45 mL Per Tube BID   ferrous sulfate  300 mg Per Tube QODAY   folic acid  1 mg Per Tube Daily   hydrALAZINE  25 mg Per Tube Q8H   mouth rinse  15 mL Mouth Rinse q12n4p   metoCLOPramide (REGLAN) injection  5 mg Intravenous Q6H   pantoprazole (PROTONIX) IV  40 mg Intravenous Q12H   prenatal vitamin w/FE, FA  1 tablet Oral Q1200   scopolamine  1 patch Transdermal Q72H   sodium chloride flush  10-40 mL Intracatheter Q12H   sodium chloride flush  3 mL Intravenous Q12H   thiamine  100 mg Per Tube Daily   vitamin B-6  12.5 mg Oral QHS   Continuous Infusions:  sodium chloride Stopped (12/19/20 1700)   feeding supplement (OSMOLITE 1.5 CAL) 1,000 mL (01/06/21 1652)   insulin 2.4 Units/hr (01/06/21 1855)   lactated ringers 10 mL/hr at 01/04/21 1116   promethazine (PHENERGAN)25mg  in 2mL NS 25 mg (01/06/21 1652)     LOS: 22 days    Time spent:25 mins. More than 50% of that time was spent in counseling and/or coordination of care.      Burnadette Pop, MD [facility] P8/17/2022, 7:40 AM

## 2021-01-07 NOTE — Care Management (Signed)
CM continues to follow for any discharge needs. [facility] referral has been made for outpatient follow up. Please call for any needs or assistance while inpatient.  Gretchen Short RNC-MNN, BSN Transitions of Care Pediatrics/[facility] [phone]

## 2021-01-07 NOTE — Progress Notes (Signed)
FACULTY PRACTICE ANTEPARTUM COMPREHENSIVE PROGRESS NOTE  Ashley Camacho is a 30 y.o. G2P0101 at [redacted]w[redacted]d who is admitted for chronic N/V in s/o T1DM, CHTN, malnutrition and anemia in s/o  intractable nausea/vomiting.  Estimated Date of Delivery: 03/30/21 Fetal presentation is cephalic.  Length of Stay:  22 Days. Admitted 12/16/2020  Subjective: She feels unchanged today. She is working on PO intake but still has emesis. She denies abdominal pain/HA/BV.   Patient reports good fetal movement.  She reports no uterine contractions, no bleeding and no loss of fluid per vagina.  Vitals:  Blood pressure (!) 114/53, pulse 89, temperature 98.7 F (37.1 C), temperature source Oral, resp. rate 16, height 5\' 1"  (1.549 m), weight 70.8 kg, SpO2 96 %. Physical Examination: CONSTITUTIONAL: Well-developed, well-nourished female in no acute distress.  NEUROLOGIC: Alert and oriented to person, place, and time. No cranial nerve deficit noted. PSYCHIATRIC: Normal mood and affect. Normal behavior. Normal judgment and thought content. CARDIOVASCULAR: Normal heart rate noted, regular rhythm RESPIRATORY: Effort and breath sounds normal, no problems with respiration noted MUSCULOSKELETAL: Normal range of motion. No edema and no tenderness. 2+ distal pulses. ABDOMEN: Soft, nontender, nondistended, gravid. CERVIX:  Not examined  Fetal monitoring: FHR: 130 bpm, Variability: moderate, Accelerations: Present, Decelerations: one variable decel with accels following it, appropriate for gestational age  Uterine activity: no contractions   Results for orders placed or performed during the hospital encounter of 12/16/20 (from the past 48 hour(s))  Low molecular wgt heparin (fractionated)     Status: None   Collection Time: 01/05/21  2:32 PM  Result Value Ref Range   Heparin LMW 0.93 IU/mL    Comment:        THERAPEUTIC RANGE: DVT,PE,ACS on LMWH 1 mg/kg q12 at 4 hrs = 0.5-1.2 units/mL. DVT,PE on LMWH 1.5 mg/kg q24 at 4  hrs = 1-2 units/mL. Performed at [facility], [address] [place], [place]   OB Urine Culture     Status: Abnormal   Collection Time: 01/05/21  2:32 PM   Specimen: Urine, Random  Result Value Ref Range   Specimen Description URINE, RANDOM    Special Requests NONE    Culture (A)     >=100,000 COLONIES/mL ENTEROCOCCUS FAECALIS NO GROUP B STREP (S.AGALACTIAE) ISOLATED Performed at [facility], [address], [place], [place]    Report Status 01/07/2021 FINAL    Organism ID, Bacteria ENTEROCOCCUS FAECALIS (A)       Susceptibility   Enterococcus faecalis - MIC*    AMPICILLIN <=2 SENSITIVE Sensitive     NITROFURANTOIN <=16 SENSITIVE Sensitive     VANCOMYCIN 2 SENSITIVE Sensitive     * >=100,000 COLONIES/mL ENTEROCOCCUS FAECALIS  Glucose, capillary     Status: Abnormal   Collection Time: 01/05/21 11:33 PM  Result Value Ref Range   Glucose-Capillary 118 (H) 70 - 99 mg/dL    Comment: Glucose reference range applies only to samples taken after fasting for at least 8 hours.  Glucose, capillary     Status: None   Collection Time: 01/06/21  4:23 AM  Result Value Ref Range   Glucose-Capillary 93 70 - 99 mg/dL    Comment: Glucose reference range applies only to samples taken after fasting for at least 8 hours.  CBC     Status: Abnormal   Collection Time: 01/06/21  4:50 AM  Result Value Ref Range   WBC 5.0 4.0 - 10.5 K/uL   RBC 2.74 (L) 3.87 -  5.11 MIL/uL   Hemoglobin 8.2 (L) 12.0 - 15.0 g/dL   HCT 01.0 (L) 27.2 - 53.6 %   MCV 95.3 80.0 - 100.0 fL   MCH 29.9 26.0 - 34.0 pg   MCHC 31.4 30.0 - 36.0 g/dL   RDW 64.4 (H) 03.4 - 74.2 %   Platelets 177 150 - 400 K/uL   nRBC 0.0 0.0 - 0.2 %    Comment: Performed at [facility], [address] [place], [place] [address]  Comprehensive metabolic panel     Status: Abnormal   Collection Time: 01/06/21  4:50 AM  Result Value Ref Range   Sodium 142 135 - 145 mmol/L   Potassium 4.8 3.5 - 5.1 mmol/L    Chloride 112 (H) 98 - 111 mmol/L   CO2 23 22 - 32 mmol/L   Glucose, Bld 101 (H) 70 - 99 mg/dL    Comment: Glucose reference range applies only to samples taken after fasting for at least 8 hours.   BUN 34 (H) 6 - 20 mg/dL   Creatinine, Ser 8.75 (H) 0.44 - 1.00 mg/dL   Calcium 8.6 (L) 8.9 - 10.3 mg/dL   Total Protein 5.6 (L) 6.5 - 8.1 g/dL   Albumin 2.6 (L) 3.5 - 5.0 g/dL   AST 28 15 - 41 U/L   ALT 20 0 - 44 U/L   Alkaline Phosphatase 163 (H) 38 - 126 U/L   Total Bilirubin 1.5 (H) 0.3 - 1.2 mg/dL   GFR, Estimated 54 (L) >60 mL/min    Comment: (NOTE) Calculated using the CKD-EPI Creatinine Equation (2021)    Anion gap 7 5 - 15    Comment: Performed at [facility], [address], [place], [place] [address]  Glucose, capillary     Status: Abnormal   Collection Time: 01/06/21  4:48 PM  Result Value Ref Range   Glucose-Capillary 117 (H) 70 - 99 mg/dL    Comment: Glucose reference range applies only to samples taken after fasting for at least 8 hours.  Glucose, capillary     Status: None   Collection Time: 01/07/21  6:08 AM  Result Value Ref Range   Glucose-Capillary 93 70 - 99 mg/dL    Comment: Glucose reference range applies only to samples taken after fasting for at least 8 hours.    Current scheduled medications  amoxicillin  500 mg Per Tube Q8H   Chlorhexidine Gluconate Cloth  6 each Topical Daily   doxylamine (Sleep)  12.5 mg Oral QHS   enoxaparin  70 mg Subcutaneous Q12H   feeding supplement (PROSource TF)  45 mL Per Tube BID   ferrous sulfate  300 mg Per Tube QODAY   folic acid  1 mg Per Tube Daily   hydrALAZINE  25 mg Per Tube Q8H   mouth rinse  15 mL Mouth Rinse q12n4p   metoCLOPramide (REGLAN) injection  5 mg Intravenous Q6H   pantoprazole (PROTONIX) IV  40 mg Intravenous Q12H   prenatal vitamin w/FE, FA  1 tablet Oral Q1200   scopolamine  1 patch Transdermal Q72H   sodium chloride flush  10-40 mL Intracatheter Q12H   sodium chloride flush  3 mL Intravenous  Q12H   Tdap  0.5 mL Intramuscular Once   thiamine  100 mg Per Tube Daily   vitamin B-6  12.5 mg Oral QHS    I have reviewed the patient's current medications.  ASSESSMENT: Active Problems:   Delivery with history of C-section  Anemia affecting pregnancy in second trimester   AKI (acute kidney injury) (HCC)   Hyperemesis   Pregnancy with 24 completed weeks gestation   Diabetes type 1, uncontrolled (HCC)   Starvation ketoacidosis   Acute deep vein thrombosis (DVT) of brachial vein of right upper extremity (HCC)   Acute respiratory failure with hypoxia (HCC)   Vitreous hemorrhage of right eye (HCC)   Gastroparesis due to DM (HCC)   Diabetic retinopathy (HCC)   Acute on chronic combined systolic and diastolic CHF (congestive heart failure) (HCC)   Type 1 diabetes mellitus affecting pregnancy in second trimester, antepartum   HFrEF (heart failure with reduced ejection fraction) (HCC)   [redacted] weeks gestation of pregnancy   Acute respiratory distress   Anemia during pregnancy   Pregnancy complicated by pre-existing type 1 diabetes in third trimester   PLAN: Routine Pregnancy - Tdap ordered. Pt may decline  FWB - Continue NST daily. NST reactive from yesterday. Today's not yet done - BPP weekly, done 8/8 with continued poly and echogenic foci in the liver - Growth next 8/22, last growth 8/1 was 59%ile - BMZ not given due to DM  MOD - RLTCS no later than 34w - Declines BTL. Declines IUD - poor reaction in the past. I counseled on other options I.e. Nexplanon.  - Discussed risk of pregnancy on her health and impact of health on pregnancy. Advised against future pregnancies.   Chronic NV - Scheduled reglan, scop patch and phenergan - Prn compazine and zofran - Tube feeds via cortrack  T1DM - Managed by internal medicine with insulin/endotool  Hematuria - Renal US negative for stones and other abnormality - Ucx positive for E. Faecalis - now on amoxicillin  Anemia -  Stable on last check. Would consider rechecking labs at least 1-2 weekly  AKI - Stable per primary team  H/o right brachial VTE - Therapeutic lovenox  Chronic but stable CF - Last echo EF was 50-55%, cardiology signed off.    Continue routine antenatal care.   Milas Hock, MD, FACOG Obstetrician & Gynecologist, [facility] for [facility], [facility]

## 2021-01-08 ENCOUNTER — Inpatient Hospital Stay (HOSPITAL_COMMUNITY): Payer: Medicaid Other

## 2021-01-08 DIAGNOSIS — R609 Edema, unspecified: Secondary | ICD-10-CM

## 2021-01-08 LAB — OCCULT BLOOD GASTRIC / DUODENUM (SPECIMEN CUP): Occult Blood, Gastric: POSITIVE — AB

## 2021-01-08 LAB — BASIC METABOLIC PANEL
Anion gap: 8 (ref 5–15)
BUN: 34 mg/dL — ABNORMAL HIGH (ref 6–20)
CO2: 22 mmol/L (ref 22–32)
Calcium: 8.4 mg/dL — ABNORMAL LOW (ref 8.9–10.3)
Chloride: 111 mmol/L (ref 98–111)
Creatinine, Ser: 1.34 mg/dL — ABNORMAL HIGH (ref 0.44–1.00)
GFR, Estimated: 55 mL/min — ABNORMAL LOW (ref >60)
Glucose, Bld: 97 mg/dL (ref 70–99)
Potassium: 5 mmol/L (ref 3.5–5.1)
Sodium: 141 mmol/L (ref 135–145)

## 2021-01-08 LAB — HEMOGLOBIN AND HEMATOCRIT, BLOOD
HCT: 25.9 % — ABNORMAL LOW (ref 36.0–46.0)
HCT: 40.8 % (ref 36.0–46.0)
HCT: 22.7 % — ABNORMAL LOW (ref 36.0–46.0)
Hemoglobin: 8.1 g/dL — ABNORMAL LOW (ref 12.0–15.0)
Hemoglobin: 12.9 g/dL (ref 12.0–15.0)
Hemoglobin: 7.3 g/dL — ABNORMAL LOW (ref 12.0–15.0)

## 2021-01-08 LAB — GLUCOSE, CAPILLARY: Glucose-Capillary: 98 mg/dL (ref 70–99)

## 2021-01-08 MED ORDER — PANTOPRAZOLE SODIUM 40 MG IV SOLR
40.0000 mg | Freq: Two times a day (BID) | INTRAVENOUS | Status: DC
  Administered 2021-01-08 – 2021-01-17 (×19): 40 mg via INTRAVENOUS
  Filled 2021-01-08 (×19): qty 40

## 2021-01-08 MED ORDER — PANTOPRAZOLE INFUSION (NEW) - SIMPLE MED
8.0000 mg/h | INTRAVENOUS | Status: DC
  Administered 2021-01-08: 8 mg/h via INTRAVENOUS
  Filled 2021-01-08: qty 80

## 2021-01-08 MED ORDER — METOCLOPRAMIDE HCL 5 MG/ML IJ SOLN
10.0000 mg | Freq: Four times a day (QID) | INTRAMUSCULAR | Status: DC | PRN
  Administered 2021-01-10: 10 mg via INTRAVENOUS

## 2021-01-08 MED ORDER — PANTOPRAZOLE SODIUM 40 MG IV SOLR
40.0000 mg | Freq: Once | INTRAVENOUS | Status: AC
  Administered 2021-01-08: 40 mg via INTRAVENOUS
  Filled 2021-01-08: qty 40

## 2021-01-08 MED ORDER — CYCLOBENZAPRINE HCL 10 MG PO TABS
5.0000 mg | ORAL_TABLET | Freq: Three times a day (TID) | ORAL | Status: DC | PRN
  Administered 2021-01-08 – 2021-01-12 (×9): 5 mg via ORAL
  Filled 2021-01-08 (×11): qty 1

## 2021-01-08 MED ORDER — ONDANSETRON HCL 4 MG/2ML IJ SOLN: 4.0000 mg | INTRAMUSCULAR | Status: DC | PRN

## 2021-01-08 MED ORDER — DIPHENHYDRAMINE HCL 50 MG/ML IJ SOLN
25.0000 mg | Freq: Once | INTRAMUSCULAR | Status: AC
  Administered 2021-01-08: 25 mg via INTRAVENOUS
  Filled 2021-01-08: qty 1

## 2021-01-08 MED ORDER — PANTOPRAZOLE SODIUM 40 MG IV SOLR: 40.0000 mg | Freq: Two times a day (BID) | INTRAVENOUS | Status: DC

## 2021-01-08 NOTE — Progress Notes (Addendum)
PROGRESS NOTE    Ashley Camacho  UKG:[id number] DOB: 11-Jul-1990 DOA: 12/16/2020 PCP: Hildred Laser, MD   Chief Complain:Nausea,vomiting  Brief Narrative:  Patient is a 30 year old female G2,P1 at [redacted] weeks gestation who has a history of type 1 diabetes mellitus.  She presented to the emergency room for evaluation of persistent nausea and vomiting .  She was recently with multiple hospitalization, at [facility] for sepsis secondary to pneumonia and was there for 2 weeks.  Couple hospitalization at [facility] due to intractable nausea and vomiting. -Transferred from [facility] to [facility] 7/26 given her severe malnutrition, with hyperemesis gravidarum, for consideration of cortrak tube insertion.  Patient had core track inserted on admission, so far tolerating tube feed (she remains with significant nausea and vomiting, but this is at baseline and did not worsen with initiation of tube feed), as well her work-up was significant for acute CHF, requiring diuresis, cardiology were following.Continues to have intermittent  nausea and vomiting . Goal is to diet advancement and removal of feeding tube.Currently attemtping on clear liquid diet.She might end up staying here until delivery at 34 weeks.  01/09/2021: Patient seen alongside patient's nurse.  Available records reviewed.  Worsening proteinuria is noted.  Last UPC done on 12/20/2020 was suggestive of 1.43 proteinuria (up from 1.03 g proteinuria on 11/21/2020).  Blood pressure of 155/98 mmHg documented around 3 PM on 01/08/2021.  Current blood pressure is 121/54 mmHg.  Patient has significant bilateral lower extremity edema and serum albumin of 2.6.  Nephrology team has been consulted.  Discussed personally with Dr. Ronalee Belts.  Patient has had intermittent, chronic thrombocytopenia without any significant drop in the last few weeks or months.  Serum creatinine was 0.81 on 10/30/2018 and has gradually risen to 1.38 today.  Patient has been type I diabetic  since age of 9.  Blood sugars controlled today.  No reported seizure activity.  No further nausea or vomiting reported.  Patient is on Osmolite 1.5 via tube feeds.  Patient reports right lower leg pain, suspect neuropathic pain.  Doppler ultrasound of the lower extremities were reported as negative.  Assessment & Plan:   Active Problems:   Delivery with history of C-section   Anemia affecting pregnancy in second trimester   AKI (acute kidney injury) (HCC)   Hyperemesis   Pregnancy with 24 completed weeks gestation   Diabetes type 1, uncontrolled (HCC)   Starvation ketoacidosis   Acute deep vein thrombosis (DVT) of brachial vein of right upper extremity (HCC)   Acute respiratory failure with hypoxia (HCC)   Vitreous hemorrhage of right eye (HCC)   Gastroparesis due to DM (HCC)   Diabetic retinopathy (HCC)   Acute on chronic combined systolic and diastolic CHF (congestive heart failure) (HCC)   Type 1 diabetes mellitus affecting pregnancy in second trimester, antepartum   HFrEF (heart failure with reduced ejection fraction) (HCC)   [redacted] weeks gestation of pregnancy   Acute respiratory distress   Anemia during pregnancy   Pre-existing type 1 diabetes mellitus during pregnancy in second trimester   Pregnancy with 28 completed weeks gestation   History of preterm delivery, currently pregnant   Intractable nausea/vomiting, severe protein calorie malnutrition, hyperemesis gravidarum/underlying gastroparesis: Presented with persistent nausea and vomiting.  Was on TPN previously which was discontinued on 7/28.  Now being fed through tube.  Tolerating tube feeding.    Was previously started on a steroid for hyperemesis gravidarum but it was discontinued because it did not help.  Currently on  Reglan, Zofran, scopolamine, Protonix, Compazine, Phenergan.Also on pyridoxine. On clear liquid diet .  Diet was advanced to full liquid yesterday but she had vomiting so diet changed to clear liquid  again Goal is to remove the tube feeding after she adequately tolerates solid food,might take long time 01/09/2021: No further nausea or vomiting reported.  See above documentation.  Type 1 diabetes: Uncontrolled with hyperglycemia alternating with hyperglycemia.  On insulin drip.  Follows with endocrinologist and takes insulin at home.Diabetic coordinator were following,. 01/09/2021: See above documentation.  Cardiomyopathy/acute systolic congestive heart failure: Currently on hydralazine Has severe bilateral lower extremity edema.  Echo done on 12/28/2020 showed ejection fraction of 50 to 55%, small pericardial effusion.  Echo done on 7/22 had shown ejection fraction of 40 to 45%.  Cardiology were following,now signed off.  Lasix on hold due to AKI. Bilateral lower extremity edema also could have been contributed due to pregnancy, hypoalbuminemia. 01/09/2021: Last echocardiogram revealed, no significant findings reported.  Right upper extremity DVT: On Lovenox  AKI on CKD stage II: Hospital course remarkable for AKI, diuresis on hold.  Continue to monitor.Creatinine  has plateaued around 1.2-1.3 01/09/2021: Technically, this remains acute kidney injury.  AKI slightly worsening.  Proteinuria is worsening.  Chronic thrombocytopenia, dull, intermittent.  Albumin of 2.6.  Nephrology team has been consulted.  Meanwhile, we will proceed with 24-hour urine collection to quantify protein, check urine sodium, obtain renal ultrasound.  We will defer other management to the nephrology team.  Normocytic anemia: Most likely  dilutional secondary to pregnancy hypervolemia/hypoalbuminemia.  Continue to monitor  Dark urine: Renal ultrasound did not show any hydronephrosis, no renal stones.  Urine culture showed Enterococcus faecalis, currently on amoxicillin.  Coffee-ground emesis: As per RN, noticed on the night of 8/18.  She is not actively bleeding.  No further episodes of coffee-ground emesis observed.  We will  continue to monitor H&H.  Her hemoglobin this morning is stable.  Continue Protonix 40 mg IV twice daily for now  Second trimester pregnancy: 28 weeks.  OB-GYN following.  Maternal-fetal medicine recommended daily fetal testing, monthly growth ultrasounds, delivery at 34 weeks, no antenatal corticosteroids.  Right lower extremity pain: -Worrisome for diabetic neuropathy. -Shortness of management to be challenging, considering the patient is pregnant. -At the best of times, this could take time to improve. -Continue to optimize diabetes mellitus management. -We will discuss safety of available management options with the primary team (OB/GYN)      Nutrition Problem: Inadequate oral intake Etiology: nausea, vomiting      DVT prophylaxis:Lovenox Code Status: Full Family Communication: Boyfriend at the bedside on 8.8.22 Status is: Inpatient  Remains inpatient appropriate because:Inpatient level of care appropriate due to severity of illness  Dispo: The patient is from: Home              Anticipated d/c is to: Home              Patient currently is not medically stable to d/c.   Difficult to place patient No  Unable to discharge the patient until she is able to tolerate solid oral food/removal of feeding tube   Consultants: Cardiology, GYN  Procedures: None  Antimicrobials:  Anti-infectives (From admission, onward)    Start     Dose/Rate Route Frequency Ordered Stop   01/06/21 1630  amoxicillin (AMOXIL) 250 MG/5ML suspension 500 mg        500 mg Per Tube Every 8 hours 01/06/21 1534 01/13/21 1359   01/06/21 1615  amoxicillin (AMOXIL) chewable tablet 500 mg  Status:  Discontinued        500 mg Oral Every 8 hours 01/06/21 1528 01/06/21 1532       Subjective: -Not very forthcoming with history -No further nausea or vomiting. -Reported lower leg pain (patient has been diabetic since age 74)  Objective: Vitals:   01/07/21 1959 01/07/21 2326 01/08/21 0355 01/08/21 0556   BP: (!) 110/54 (!) 123/52 (!) 116/51   Pulse: 91 91 86   Resp: 17 17 18    Temp: 98.1 F (36.7 C) 97.9 F (36.6 C) 97.8 F (36.6 C)   TempSrc:  Oral Oral   SpO2:  97% 96%   Weight:    71.4 kg  Height:         Intake/Output Summary (Last 24 hours) at 01/08/2021 0716 Last data filed at 01/08/2021 0500 Gross per 24 hour  Intake 1239.67 ml  Output 1275 ml  Net -35.33 ml   Filed Weights   01/06/21 0422 01/07/21 0530 01/08/21 0556  Weight: 69.4 kg 70.8 kg 71.4 kg    Examination: General exam: Very deconditioned, weak, lying in bed.  Edematous. HEENT: PERRL, feeding tube Respiratory system:  no wheezes or crackles  Cardiovascular system: S1 & S2 heard, RRR.  Gastrointestinal system: Abdomen is nondistended, soft and nontender. Central nervous system: Alert and oriented Extremities: 2+ to 3 bilateral lower extremity pitting  edema  Data Reviewed: I have personally reviewed following labs and imaging studies  CBC: Recent Labs  Lab 01/01/21 1536 01/05/21 0500 01/06/21 0450 01/08/21 0320  WBC 4.7 4.3 5.0  --   NEUTROABS  --  2.8  --   --   HGB 8.1* 7.5* 8.2* 8.1*  HCT 25.5* 23.8* 26.1* 25.9*  MCV 92.7 93.7 95.3  --   PLT 174 159 177  --    Basic Metabolic Panel: Recent Labs  Lab 01/02/21 0540 01/04/21 1645 01/05/21 0500 01/06/21 0450 01/08/21 0320  NA 142 142 142 142 141  K 4.8 5.0 5.0 4.8 5.0  CL 109 113* 114* 112* 111  CO2 23 22 23 23 22   GLUCOSE 93 143* 114* 101* 97  BUN 35* 35* 35* 34* 34*  CREATININE 1.27* 1.31* 1.28* 1.36* 1.34*  CALCIUM 8.8* 8.4* 8.2* 8.6* 8.4*   GFR: Estimated Creatinine Clearance: 55.9 mL/min (A) (by C-G formula based on SCr of 1.34 mg/dL (H)). Liver Function Tests: Recent Labs  Lab 01/01/21 1536 01/04/21 1645 01/06/21 0450  AST 24 27 28   ALT 18 20 20   ALKPHOS 153* 166* 163*  BILITOT 0.8 1.4* 1.5*  PROT 5.8* 5.6* 5.6*  ALBUMIN 2.9* 2.7* 2.6*   No results for input(s): LIPASE, AMYLASE in the last 168 hours. No results for  input(s): AMMONIA in the last 168 hours. Coagulation Profile: No results for input(s): INR, PROTIME in the last 168 hours. Cardiac Enzymes: No results for input(s): CKTOTAL, CKMB, CKMBINDEX, TROPONINI in the last 168 hours. BNP (last 3 results) No results for input(s): PROBNP in the last 8760 hours. HbA1C: No results for input(s): HGBA1C in the last 72 hours. CBG: Recent Labs  Lab 01/05/21 2333 01/06/21 0423 01/06/21 1648 01/07/21 0608 01/08/21 0621  GLUCAP 118* 93 117* 93 98   Lipid Profile: No results for input(s): CHOL, HDL, LDLCALC, TRIG, CHOLHDL, LDLDIRECT in the last 72 hours. Thyroid Function Tests: No results for input(s): TSH, T4TOTAL, FREET4, T3FREE, THYROIDAB in the last 72 hours. Anemia Panel: No results for input(s): VITAMINB12, FOLATE, FERRITIN, TIBC, IRON, RETICCTPCT  in the last 72 hours. Sepsis Labs: No results for input(s): PROCALCITON, LATICACIDVEN in the last 168 hours.  Recent Results (from the past 240 hour(s))  OB Urine Culture     Status: Abnormal   Collection Time: 01/05/21  2:32 PM   Specimen: Urine, Random  Result Value Ref Range Status   Specimen Description URINE, RANDOM  Final   Special Requests NONE  Final   Culture (A)  Final    >=100,000 COLONIES/mL ENTEROCOCCUS FAECALIS NO GROUP B STREP (S.AGALACTIAE) ISOLATED Performed at [facility], [address] [place], [place] [address]    Report Status 01/07/2021 FINAL  Final   Organism ID, Bacteria ENTEROCOCCUS FAECALIS (A)  Final      Susceptibility   Enterococcus faecalis - MIC*    AMPICILLIN <=2 SENSITIVE Sensitive     NITROFURANTOIN <=16 SENSITIVE Sensitive     VANCOMYCIN 2 SENSITIVE Sensitive     * >=100,000 COLONIES/mL ENTEROCOCCUS FAECALIS          Radiology Studies: No results found.      Scheduled Meds:  amoxicillin  500 mg Per Tube Q8H   Chlorhexidine Gluconate Cloth  6 each Topical Daily   doxylamine (Sleep)  12.5 mg Oral QHS   enoxaparin  70 mg Subcutaneous  Q12H   feeding supplement (PROSource TF)  45 mL Per Tube BID   ferrous sulfate  300 mg Per Tube QODAY   folic acid  1 mg Per Tube Daily   hydrALAZINE  25 mg Per Tube Q8H   mouth rinse  15 mL Mouth Rinse q12n4p   metoCLOPramide (REGLAN) injection  5 mg Intravenous Q6H   prenatal vitamin w/FE, FA  1 tablet Oral Q1200   scopolamine  1 patch Transdermal Q72H   sodium chloride flush  10-40 mL Intracatheter Q12H   sodium chloride flush  3 mL Intravenous Q12H   Tdap  0.5 mL Intramuscular Once   thiamine  100 mg Per Tube Daily   vitamin B-6  12.5 mg Oral QHS   Continuous Infusions:  sodium chloride 250 mL (01/08/21 0341)   feeding supplement (OSMOLITE 1.5 CAL) 1,000 mL (01/07/21 1020)   insulin 1.5 Units/hr (01/08/21 0540)   lactated ringers 10 mL/hr at 01/07/21 2221   pantoprazole 8 mg/hr (01/08/21 0349)   promethazine (PHENERGAN)25mg  in 35mL NS 25 mg (01/08/21 0138)     LOS: 23 days    Time spent: 35 minutes   Berton Mount, MD [facility] P8/18/2022, 7:16 AM

## 2021-01-08 NOTE — Progress Notes (Signed)
Right lower extremity venous duplex completed.  Refer to "CV Proc" under chart review to view preliminary results.  01/08/2021 7:01 PM Eula Fried., MHA, RVT, RDCS, RDMS

## 2021-01-08 NOTE — Progress Notes (Signed)
Chaplain follow up visit with Ashley Camacho at her bedside. Pt having blood drawn and receiving medication through feeding tube during visit. Pt stated she was agreeable to visit at the same time. Chaplain asked open ended questions to facilitate emotional expression and story telling. Pt shared that she is in a lot of pain today. Her right leg is hurting, which is a new problem for her and one she does not know the origin of. She has not been able to find relief or think about anything else.  Chaplain identified frustration, anger, and fatigue and normalized these feelings in the midst of a prolonged hospital stay. Pt could not identify any sources of hope of comfort in the midst of her current struggle and stated she is "just so tired." Chaplain offered to return at a later time in hopes that pt might be able to get some rest after medical interventions were complete. Chaplain named hope that pain will have receded while pt sleeps and pt affirmed she would appreciate follow up at a later time.  Please page as further needs arise.  Ashley Camacho. Ashley Camacho, M.Div. [facility] Chaplain Pager [phone] Office [phone]

## 2021-01-08 NOTE — Progress Notes (Signed)
FACULTY PRACTICE ANTEPARTUM COMPREHENSIVE PROGRESS NOTE  Ashley Camacho is a 30 y.o. G2P0101 at [redacted]w[redacted]d who is admitted for chronic N/V in s/o T1DM, CHTN, malnutrition and anemia in s/o  intractable nausea/vomiting.  Estimated Date of Delivery: 03/30/21 Fetal presentation is cephalic.  Length of Stay:  23 Days. Admitted 12/16/2020  Subjective:  Today she has no change except coffee ground emesis noted. + occult blood. She denies abdominal pain, CP, SOB. She denies HA/BV.   She does note right left tingling. She has normal sensation. It almost feels like it is asleep. Doesn't feel restless.   Patient reports good fetal movement.  She reports no uterine contractions, no bleeding and no loss of fluid per vagina.  Vitals:  Blood pressure (!) 128/58, pulse 91, temperature 97.8 F (36.6 C), temperature source Oral, resp. rate 18, height 5\' 1"  (1.549 m), weight 71.4 kg, SpO2 99 %. Physical Examination: CONSTITUTIONAL: Well-developed, well-nourished female in no acute distress.  NEUROLOGIC: Alert and oriented to person, place, and time. No cranial nerve deficit noted. PSYCHIATRIC: Normal mood and affect. Normal behavior. Normal judgment and thought content. CARDIOVASCULAR: Normal heart rate noted, regular rhythm RESPIRATORY: Effort and breath sounds normal, no problems with respiration noted MUSCULOSKELETAL: Normal range of motion. No edema and no tenderness. 2+ distal pulses. ABDOMEN: Soft, nontender, nondistended, gravid. CERVIX:    Fetal monitoring: FHR: 140 bpm, Variability: moderate, Accelerations: Present, Decelerations: Absent  Uterine activity: no contractions per hour  Results for orders placed or performed during the hospital encounter of 12/16/20 (from the past 48 hour(s))  Glucose, capillary     Status: Abnormal   Collection Time: 01/06/21  4:48 PM  Result Value Ref Range   Glucose-Capillary 117 (H) 70 - 99 mg/dL    Comment: Glucose reference range applies only to samples taken  after fasting for at least 8 hours.  Glucose, capillary     Status: None   Collection Time: 01/07/21  6:08 AM  Result Value Ref Range   Glucose-Capillary 93 70 - 99 mg/dL    Comment: Glucose reference range applies only to samples taken after fasting for at least 8 hours.  Occult blood gastric / duodenum     Status: Abnormal   Collection Time: 01/08/21  2:13 AM  Result Value Ref Range   pH, Gastric NOT DONE    Occult Blood, Gastric POSITIVE (A) NEGATIVE    Comment: Performed at [facility], [address] [place], [place]  Basic metabolic panel     Status: Abnormal   Collection Time: 01/08/21  3:20 AM  Result Value Ref Range   Sodium 141 135 - 145 mmol/L   Potassium 5.0 3.5 - 5.1 mmol/L   Chloride 111 98 - 111 mmol/L   CO2 22 22 - 32 mmol/L   Glucose, Bld 97 70 - 99 mg/dL    Comment: Glucose reference range applies only to samples taken after fasting for at least 8 hours.   BUN 34 (H) 6 - 20 mg/dL   Creatinine, Ser 01/10/21 (H) 0.44 - 1.00 mg/dL   Calcium 8.4 (L) 8.9 - 10.3 mg/dL   GFR, Estimated 55 (L) >60 mL/min    Comment: (NOTE) Calculated using the CKD-EPI Creatinine Equation (2021)    Anion gap 8 5 - 15    Comment: Performed at [facility], [address] [place], [place]  Hemoglobin and hematocrit, blood     Status: Abnormal   Collection Time: 01/08/21  3:20 AM  Result Value Ref Range   Hemoglobin 8.1 (L) 12.0 - 15.0 g/dL   HCT 16.1 (L) 09.6 - 04.5 %    Comment: Performed at [facility], [address], [place], [place] [address]  Type and screen [facility]     Status: None   Collection Time: 01/08/21  3:25 AM  Result Value Ref Range   ABO/RH(D) A POS    Antibody Screen NEG    Sample Expiration      01/11/2021,2359 Performed at [facility], [address], [place], [place] [address]   Glucose, capillary     Status: None   Collection Time: 01/08/21  6:21 AM  Result Value Ref Range   Glucose-Capillary  98 70 - 99 mg/dL    Comment: Glucose reference range applies only to samples taken after fasting for at least 8 hours.    No results found.  Current scheduled medications  amoxicillin  500 mg Per Tube Q8H   Chlorhexidine Gluconate Cloth  6 each Topical Daily   diphenhydrAMINE  25 mg Intravenous Once   doxylamine (Sleep)  12.5 mg Oral QHS   enoxaparin  70 mg Subcutaneous Q12H   feeding supplement (PROSource TF)  45 mL Per Tube BID   ferrous sulfate  300 mg Per Tube QODAY   folic acid  1 mg Per Tube Daily   hydrALAZINE  25 mg Per Tube Q8H   mouth rinse  15 mL Mouth Rinse q12n4p   metoCLOPramide (REGLAN) injection  5 mg Intravenous Q6H   pantoprazole (PROTONIX) IV  40 mg Intravenous Q12H   prenatal vitamin w/FE, FA  1 tablet Oral Q1200   scopolamine  1 patch Transdermal Q72H   sodium chloride flush  10-40 mL Intracatheter Q12H   sodium chloride flush  3 mL Intravenous Q12H   thiamine  100 mg Per Tube Daily   vitamin B-6  12.5 mg Oral QHS    I have reviewed the patient's current medications.  ASSESSMENT: Active Problems:   Delivery with history of C-section   Anemia affecting pregnancy in second trimester   AKI (acute kidney injury) (HCC)   Hyperemesis   Pregnancy with 24 completed weeks gestation   Diabetes type 1, uncontrolled (HCC)   Starvation ketoacidosis   Acute deep vein thrombosis (DVT) of brachial vein of right upper extremity (HCC)   Acute respiratory failure with hypoxia (HCC)   Vitreous hemorrhage of right eye (HCC)   Gastroparesis due to DM (HCC)   Diabetic retinopathy (HCC)   Acute on chronic combined systolic and diastolic CHF (congestive heart failure) (HCC)   Type 1 diabetes mellitus affecting pregnancy in second trimester, antepartum   HFrEF (heart failure with reduced ejection fraction) (HCC)   [redacted] weeks gestation of pregnancy   Acute respiratory distress   Anemia during pregnancy   Pre-existing type 1 diabetes mellitus during pregnancy in second  trimester   Pregnancy with 28 completed weeks gestation   History of preterm delivery, currently pregnant   PLAN: Routine Pregnancy - s/p tdap   FWB - Continue NST daily. NST reactive from yesterday. Today's not yet done - BPP weekly, done 8/8 with continued poly and echogenic foci in the liver - Growth next 8/22, last growth 8/1 was 59%ile - BMZ not given due to DM   MOD - RLTCS no later than 34w - Declines BTL. Declines IUD - poor reaction in the past. I counseled on other options I.e. Nexplanon.  - She is  undecided at this time.    Chronic NV - Scheduled reglan, scop patch and phenergan - Prn compazine and zofran - Tube feeds via cortrack   T1DM - Managed by internal medicine with insulin/endotool   Hematuria  - Resolving with treatment - urine clear today - Renal US negative for stones and other abnormality - Ucx positive for E. Faecalis - now on amoxicillin   Anemia - Stable on last check. Would consider rechecking labs at least 1-2 weekly   AKI - Stable per primary team   H/o right brachial VTE - Therapeutic lovenox   Chronic but stable CF - Last echo EF was 50-55%, cardiology signed off.   11. Right tingling - Could be due to anti-emetics. Will try benadryl. If persistent despite benadryl, she will let her nurse know so they may inform primary team   Continue routine antenatal care.   Milas Hock, MD, FACOG Obstetrician & Gynecologist, [facility] for [facility], [facility]

## 2021-01-09 ENCOUNTER — Inpatient Hospital Stay (HOSPITAL_COMMUNITY): Payer: Medicaid Other

## 2021-01-09 DIAGNOSIS — R600 Localized edema: Secondary | ICD-10-CM

## 2021-01-09 LAB — BASIC METABOLIC PANEL
Anion gap: 7 (ref 5–15)
BUN: 36 mg/dL — ABNORMAL HIGH (ref 6–20)
CO2: 21 mmol/L — ABNORMAL LOW (ref 22–32)
Calcium: 8.1 mg/dL — ABNORMAL LOW (ref 8.9–10.3)
Chloride: 111 mmol/L (ref 98–111)
Creatinine, Ser: 1.38 mg/dL — ABNORMAL HIGH (ref 0.44–1.00)
GFR, Estimated: 53 mL/min — ABNORMAL LOW (ref >60)
Glucose, Bld: 111 mg/dL — ABNORMAL HIGH (ref 70–99)
Potassium: 4.9 mmol/L (ref 3.5–5.1)
Sodium: 139 mmol/L (ref 135–145)

## 2021-01-09 LAB — HEMOGLOBIN AND HEMATOCRIT, BLOOD
HCT: 22.9 % — ABNORMAL LOW (ref 36.0–46.0)
HCT: 22.6 % — ABNORMAL LOW (ref 36.0–46.0)
Hemoglobin: 7.3 g/dL — ABNORMAL LOW (ref 12.0–15.0)
Hemoglobin: 7.4 g/dL — ABNORMAL LOW (ref 12.0–15.0)

## 2021-01-09 LAB — HEPATITIS PANEL, ACUTE
HCV Ab: NONREACTIVE
Hep A IgM: NONREACTIVE
Hep B C IgM: NONREACTIVE
Hepatitis B Surface Ag: NONREACTIVE

## 2021-01-09 LAB — PROTIME-INR
INR: 1 (ref 0.8–1.2)
Prothrombin Time: 13.5 seconds (ref 11.4–15.2)

## 2021-01-09 LAB — GLUCOSE, CAPILLARY: Glucose-Capillary: 121 mg/dL — ABNORMAL HIGH (ref 70–99)

## 2021-01-09 MED ORDER — ALBUMIN HUMAN 25 % IV SOLN
25.0000 g | Freq: Four times a day (QID) | INTRAVENOUS | Status: AC
  Administered 2021-01-09 – 2021-01-10 (×2): 25 g via INTRAVENOUS
  Filled 2021-01-09 (×2): qty 100

## 2021-01-09 MED ORDER — OXYCODONE HCL 5 MG PO TABS: 5.0000 mg | ORAL_TABLET | Freq: Four times a day (QID) | ORAL | Status: DC | PRN

## 2021-01-09 MED ORDER — GABAPENTIN 100 MG PO CAPS
100.0000 mg | ORAL_CAPSULE | Freq: Three times a day (TID) | ORAL | Status: DC
  Administered 2021-01-09 – 2021-01-12 (×9): 100 mg via ORAL
  Filled 2021-01-09 (×9): qty 1

## 2021-01-09 MED ORDER — OXYCODONE HCL 5 MG PO TABS
5.0000 mg | ORAL_TABLET | Freq: Once | ORAL | Status: AC
Start: 2021-01-09 — End: 2021-01-09
  Administered 2021-01-09: 5 mg via ORAL
  Filled 2021-01-09: qty 1

## 2021-01-09 MED ORDER — OXYCODONE HCL 5 MG/5ML PO SOLN
5.0000 mg | Freq: Four times a day (QID) | ORAL | Status: DC | PRN
  Administered 2021-01-09 – 2021-01-12 (×9): 5 mg
  Filled 2021-01-09 (×10): qty 5

## 2021-01-09 NOTE — Progress Notes (Signed)
PROGRESS NOTE    Ashley Camacho  HQP:[id number] DOB: 04-07-1991 DOA: 12/16/2020 PCP: Hildred Laser, MD   Chief Complain:Nausea,vomiting  Brief Narrative:  Patient is a 30 year old female G2,P1 at [redacted] weeks gestation who has a history of type 1 diabetes mellitus.  She presented to the emergency room for evaluation of persistent nausea and vomiting .  She was recently with multiple hospitalization, at [facility] for sepsis secondary to pneumonia and was there for 2 weeks.  Couple hospitalization at [facility] due to intractable nausea and vomiting. -Transferred from [facility] to [facility] 7/26 given her severe malnutrition, with hyperemesis gravidarum, for consideration of cortrak tube insertion.  Patient had core track inserted on admission, so far tolerating tube feed (she remains with significant nausea and vomiting, but this is at baseline and did not worsen with initiation of tube feed), as well her work-up was significant for acute CHF, requiring diuresis, cardiology were following.Continues to have intermittent  nausea and vomiting . Goal is to diet advancement and removal of feeding tube.Currently attemtping on clear liquid diet.She might end up staying here until delivery at 34 weeks.  01/09/2021: Patient seen alongside patient's nurse.  Available records reviewed.  Worsening proteinuria is noted.  Last UPC done on 12/20/2020 was suggestive of 1.43 proteinuria (up from 1.03 g proteinuria on 11/21/2020).  Blood pressure of 155/98 mmHg documented around 3 PM on 01/08/2021.  Current blood pressure is 121/54 mmHg.  Patient has significant bilateral lower extremity edema and serum albumin of 2.6.  Nephrology team has been consulted.  Discussed personally with Dr. Ronalee Belts.  Patient has had intermittent, chronic thrombocytopenia without any significant drop in the last few weeks or months.  Serum creatinine was 0.81 on 10/30/2018 and has gradually risen to 1.38 today.  Patient has been type I diabetic  since age of 45.  Blood sugars controlled today.  No reported seizure activity.  No further nausea or vomiting reported.  Patient is on Osmolite 1.5 via tube feeds.  Patient reports right lower leg pain, suspect neuropathic pain.  Doppler ultrasound of the lower extremities were reported as negative.  Assessment & Plan:   Active Problems:   Delivery with history of C-section   Anemia affecting pregnancy in second trimester   AKI (acute kidney injury) (HCC)   Hyperemesis   Pregnancy with 24 completed weeks gestation   Diabetes type 1, uncontrolled (HCC)   Starvation ketoacidosis   Acute deep vein thrombosis (DVT) of brachial vein of right upper extremity (HCC)   Acute respiratory failure with hypoxia (HCC)   Vitreous hemorrhage of right eye (HCC)   Gastroparesis due to DM (HCC)   Diabetic retinopathy (HCC)   Acute on chronic combined systolic and diastolic CHF (congestive heart failure) (HCC)   Type 1 diabetes mellitus affecting pregnancy in second trimester, antepartum   HFrEF (heart failure with reduced ejection fraction) (HCC)   [redacted] weeks gestation of pregnancy   Acute respiratory distress   Anemia during pregnancy   Pre-existing type 1 diabetes mellitus during pregnancy in second trimester   Pregnancy with 28 completed weeks gestation   History of preterm delivery, currently pregnant   Leg edema   Intractable nausea/vomiting, severe protein calorie malnutrition, hyperemesis gravidarum/underlying gastroparesis: Presented with persistent nausea and vomiting.  Was on TPN previously which was discontinued on 7/28.  Now being fed through tube.  Tolerating tube feeding.    Was previously started on a steroid for hyperemesis gravidarum but it was discontinued because it did not  help.  Currently on Reglan, Zofran, scopolamine, Protonix, Compazine, Phenergan.Also on pyridoxine. On clear liquid diet .  Diet was advanced to full liquid yesterday but she had vomiting so diet changed to clear  liquid again Goal is to remove the tube feeding after she adequately tolerates solid food,might take long time 01/09/2021: No further nausea or vomiting reported.  See above documentation.  Type 1 diabetes: Uncontrolled with hyperglycemia alternating with hyperglycemia.  On insulin drip.  Follows with endocrinologist and takes insulin at home.Diabetic coordinator were following,. 01/09/2021: See above documentation.  Cardiomyopathy/acute systolic congestive heart failure: Currently on hydralazine Has severe bilateral lower extremity edema.  Echo done on 12/28/2020 showed ejection fraction of 50 to 55%, small pericardial effusion.  Echo done on 7/22 had shown ejection fraction of 40 to 45%.  Cardiology were following,now signed off.  Lasix on hold due to AKI. Bilateral lower extremity edema also could have been contributed due to pregnancy, hypoalbuminemia. 01/09/2021: Last echocardiogram revealed, no significant findings reported.  Right upper extremity DVT: On Lovenox  AKI on CKD stage II: Hospital course remarkable for AKI, diuresis on hold.  Continue to monitor.Creatinine  has plateaued around 1.2-1.3 01/09/2021: Technically, this remains acute kidney injury.  AKI slightly worsening.  Proteinuria is worsening.  Chronic thrombocytopenia, dull, intermittent.  Albumin of 2.6.  Nephrology team has been consulted.  Meanwhile, we will proceed with 24-hour urine collection to quantify protein, check urine sodium, obtain renal ultrasound.  We will defer other management to the nephrology team.  Normocytic anemia: Most likely  dilutional secondary to pregnancy hypervolemia/hypoalbuminemia.  Continue to monitor  Dark urine: Renal ultrasound did not show any hydronephrosis, no renal stones.  Urine culture showed Enterococcus faecalis, currently on amoxicillin.  Coffee-ground emesis: As per RN, noticed on the night of 8/18.  She is not actively bleeding.  No further episodes of coffee-ground emesis observed.   We will continue to monitor H&H.  Her hemoglobin this morning is stable.  Continue Protonix 40 mg IV twice daily for now  Second trimester pregnancy: 28 weeks.  OB-GYN following.  Maternal-fetal medicine recommended daily fetal testing, monthly growth ultrasounds, delivery at 34 weeks, no antenatal corticosteroids.  Right lower extremity pain: -Worrisome for diabetic neuropathy. -Shortness of management to be challenging, considering the patient is pregnant. -At the best of times, this could take time to improve. -Continue to optimize diabetes mellitus management. -We will discuss safety of available management options with the primary team (OB/GYN)      Nutrition Problem: Inadequate oral intake Etiology: nausea, vomiting      DVT prophylaxis:Lovenox Code Status: Full Family Communication: Boyfriend at the bedside on 8.8.22 Status is: Inpatient  Remains inpatient appropriate because:Inpatient level of care appropriate due to severity of illness  Dispo: The patient is from: Home              Anticipated d/c is to: Home              Patient currently is not medically stable to d/c.   Difficult to place patient No  Unable to discharge the patient until she is able to tolerate solid oral food/removal of feeding tube   Consultants: Cardiology, GYN  Procedures: None  Antimicrobials:  Anti-infectives (From admission, onward)    Start     Dose/Rate Route Frequency Ordered Stop   01/06/21 1630  amoxicillin (AMOXIL) 250 MG/5ML suspension 500 mg        500 mg Per Tube Every 8 hours 01/06/21 1534 01/13/21 1359  01/06/21 1615  amoxicillin (AMOXIL) chewable tablet 500 mg  Status:  Discontinued        500 mg Oral Every 8 hours 01/06/21 1528 01/06/21 1532       Subjective: -Not very forthcoming with history -No further nausea or vomiting. -Reported lower leg pain (patient has been diabetic since age 61)  Objective: Vitals:   01/09/21 0758 01/09/21 1145 01/09/21 1523  01/09/21 2010  BP: (!) 125/55 (!) 126/59 (!) 121/54 133/61  Pulse: 88 87 89 87  Resp: 18 16 16    Temp: 98.2 F (36.8 C) 97.7 F (36.5 C) 97.8 F (36.6 C) (!) 97.5 F (36.4 C)  TempSrc: Oral Oral Oral Oral  SpO2: 97% 98% 98%   Weight:      Height:         Intake/Output Summary (Last 24 hours) at 01/09/2021 2056 Last data filed at 01/09/2021 1600 Gross per 24 hour  Intake 1476.76 ml  Output 900 ml  Net 576.76 ml    Filed Weights   01/07/21 0530 01/08/21 0556 01/09/21 01/11/21  Weight: 70.8 kg 71.4 kg 69.1 kg    Examination: General exam: Very deconditioned, weak, lying in bed.  Edematous. HEENT: PERRL, feeding tube Respiratory system:  no wheezes or crackles  Cardiovascular system: S1 & S2 heard, RRR.  Gastrointestinal system: Abdomen is nondistended, soft and nontender. Central nervous system: Alert and oriented Extremities: 2+ to 3 bilateral lower extremity pitting  edema  Data Reviewed: I have personally reviewed following labs and imaging studies  CBC: Recent Labs  Lab 01/05/21 0500 01/06/21 0450 01/08/21 0320 01/08/21 1515 01/08/21 1723 01/09/21 0128 01/09/21 0544  WBC 4.3 5.0  --   --   --   --   --   NEUTROABS 2.8  --   --   --   --   --   --   HGB 7.5* 8.2* 8.1* 12.9 7.3* 7.3* 7.4*  HCT 23.8* 26.1* 25.9* 40.8 22.7* 22.9* 22.6*  MCV 93.7 95.3  --   --   --   --   --   PLT 159 177  --   --   --   --   --     Basic Metabolic Panel: Recent Labs  Lab 01/04/21 1645 01/05/21 0500 01/06/21 0450 01/08/21 0320 01/09/21 0544  NA 142 142 142 141 139  K 5.0 5.0 4.8 5.0 4.9  CL 113* 114* 112* 111 111  CO2 22 23 23 22  21*  GLUCOSE 143* 114* 101* 97 111*  BUN 35* 35* 34* 34* 36*  CREATININE 1.31* 1.28* 1.36* 1.34* 1.38*  CALCIUM 8.4* 8.2* 8.6* 8.4* 8.1*    GFR: Estimated Creatinine Clearance: 53.5 mL/min (A) (by C-G formula based on SCr of 1.38 mg/dL (H)). Liver Function Tests: Recent Labs  Lab 01/04/21 1645 01/06/21 0450  AST 27 28  ALT 20 20   ALKPHOS 166* 163*  BILITOT 1.4* 1.5*  PROT 5.6* 5.6*  ALBUMIN 2.7* 2.6*    No results for input(s): LIPASE, AMYLASE in the last 168 hours. No results for input(s): AMMONIA in the last 168 hours. Coagulation Profile: Recent Labs  Lab 01/09/21 1642  INR 1.0   Cardiac Enzymes: No results for input(s): CKTOTAL, CKMB, CKMBINDEX, TROPONINI in the last 168 hours. BNP (last 3 results) No results for input(s): PROBNP in the last 8760 hours. HbA1C: No results for input(s): HGBA1C in the last 72 hours. CBG: Recent Labs  Lab 01/06/21 0423 01/06/21 1648 01/07/21 01/08/21 01/08/21 7106 01/09/21  0625  GLUCAP 93 117* 93 98 121*    Lipid Profile: No results for input(s): CHOL, HDL, LDLCALC, TRIG, CHOLHDL, LDLDIRECT in the last 72 hours. Thyroid Function Tests: No results for input(s): TSH, T4TOTAL, FREET4, T3FREE, THYROIDAB in the last 72 hours. Anemia Panel: No results for input(s): VITAMINB12, FOLATE, FERRITIN, TIBC, IRON, RETICCTPCT in the last 72 hours. Sepsis Labs: No results for input(s): PROCALCITON, LATICACIDVEN in the last 168 hours.  Recent Results (from the past 240 hour(s))  OB Urine Culture     Status: Abnormal   Collection Time: 01/05/21  2:32 PM   Specimen: Urine, Random  Result Value Ref Range Status   Specimen Description URINE, RANDOM  Final   Special Requests NONE  Final   Culture (A)  Final    >=100,000 COLONIES/mL ENTEROCOCCUS FAECALIS NO GROUP B STREP (S.AGALACTIAE) ISOLATED Performed at [facility], [address] [place], [place] [address]    Report Status 01/07/2021 FINAL  Final   Organism ID, Bacteria ENTEROCOCCUS FAECALIS (A)  Final      Susceptibility   Enterococcus faecalis - MIC*    AMPICILLIN <=2 SENSITIVE Sensitive     NITROFURANTOIN <=16 SENSITIVE Sensitive     VANCOMYCIN 2 SENSITIVE Sensitive     * >=100,000 COLONIES/mL ENTEROCOCCUS FAECALIS          Radiology Studies: US RENAL  Result Date: 01/09/2021 CLINICAL DATA:  Acute  kidney injury. EXAM: RENAL / URINARY TRACT ULTRASOUND COMPLETE COMPARISON:  01/05/2021 FINDINGS: Right Kidney: Renal measurements: 11.3 x 5.8 x 5.6 cm = volume: 191 mL. Moderate hydronephrosis of the right kidney similar to prior study. Normal parenchymal thickness and echotexture. No focal mass lesions. Left Kidney: Renal measurements: 9.1 x 4.4 x 4.1 cm = volume: 87 mL. Echogenicity within normal limits. No mass or hydronephrosis visualized. Bladder: Appears normal for degree of bladder distention. Other: Small bilateral pleural effusions are incidentally demonstrated. IMPRESSION: 1. Moderate right hydronephrosis similar to prior study. 2. Small bilateral pleural effusions. Electronically Signed   By: Burman NievesWilliam  Stevens M.D.   On: 01/09/2021 19:41   VAS US LOWER EXTREMITY VENOUS (DVT)  Result Date: 01/09/2021  Lower Venous DVT Study Patient Name:  Hollice GongMIRISHA P Vanduyne  Date of Exam:   01/08/2021 Medical Rec #: [id number]        Accession #:    [id number] Date of Birth: 25-Dec-1990       Patient Gender: F Patient Age:   [age] years [facility] Procedure:      VAS US LOWER EXTREMITY VENOUS (DVT) Referring Phys: Milas HockPAULA DUNCAN --------------------------------------------------------------------------------  Indications: Edema, and [redacted] weeks pregnant.  Risk Factors: Hyperemesis gravidarum. Limitations: Poor ultrasound/tissue interface and interstitial fluid. Comparison Study: No prior study Performing Technologist: Gertie FeyMichelle Simonetti MHA, RDMS, RVT, RDCS  Examination Guidelines: A complete evaluation includes B-mode imaging, spectral Doppler, color Doppler, and power Doppler as needed of all accessible portions of each vessel. Bilateral testing is considered an integral part of a complete examination. Limited examinations for reoccurring indications may be performed as noted. The reflux portion of the exam is performed with the patient in reverse Trendelenburg.   +---------+---------------+---------+-----------+----------+--------------+ RIGHT    CompressibilityPhasicitySpontaneityPropertiesThrombus Aging +---------+---------------+---------+-----------+----------+--------------+ CFV      Full           Yes      Yes                                 +---------+---------------+---------+-----------+----------+--------------+  SFJ      Full                                                        +---------+---------------+---------+-----------+----------+--------------+ FV Prox  Full                                                        +---------+---------------+---------+-----------+----------+--------------+ FV Mid   Full                                                        +---------+---------------+---------+-----------+----------+--------------+ FV DistalFull                                                        +---------+---------------+---------+-----------+----------+--------------+ PFV      Full                                                        +---------+---------------+---------+-----------+----------+--------------+ POP      Full           Yes      Yes                                 +---------+---------------+---------+-----------+----------+--------------+ PTV      Full                                                        +---------+---------------+---------+-----------+----------+--------------+ PERO     Full                                                        +---------+---------------+---------+-----------+----------+--------------+   +----+---------------+---------+-----------+----------+--------------+ LEFTCompressibilityPhasicitySpontaneityPropertiesThrombus Aging +----+---------------+---------+-----------+----------+--------------+ CFV Full           Yes      Yes                                  +----+---------------+---------+-----------+----------+--------------+     Summary: RIGHT: - There is no evidence of deep vein thrombosis in the lower extremity.  - No cystic structure found in the popliteal fossa.  LEFT: - No evidence of common femoral vein obstruction.  *See table(s) above for measurements and observations. Electronically signed by Deitra Mayo MD on  01/09/2021 at 2:38:53 PM.    Final         Scheduled Meds:  amoxicillin  500 mg Per Tube Q8H   Chlorhexidine Gluconate Cloth  6 each Topical Daily   doxylamine (Sleep)  12.5 mg Oral QHS   enoxaparin  70 mg Subcutaneous Q12H   feeding supplement (PROSource TF)  45 mL Per Tube BID   ferrous sulfate  300 mg Per Tube QODAY   folic acid  1 mg Per Tube Daily   gabapentin  100 mg Oral Q8H   hydrALAZINE  25 mg Per Tube Q8H   mouth rinse  15 mL Mouth Rinse q12n4p   metoCLOPramide (REGLAN) injection  5 mg Intravenous Q6H   pantoprazole (PROTONIX) IV  40 mg Intravenous Q12H   prenatal vitamin w/FE, FA  1 tablet Oral Q1200   scopolamine  1 patch Transdermal Q72H   sodium chloride flush  10-40 mL Intracatheter Q12H   sodium chloride flush  3 mL Intravenous Q12H   thiamine  100 mg Per Tube Daily   vitamin B-6  12.5 mg Oral QHS   Continuous Infusions:  sodium chloride 250 mL (01/08/21 2247)   albumin human 25 g (01/09/21 2022)   feeding supplement (OSMOLITE 1.5 CAL) 1,000 mL (01/09/21 0319)   insulin Stopped (01/09/21 1916)   lactated ringers 10 mL/hr at 01/07/21 2221   promethazine (PHENERGAN)25mg  in 35mL NS 25 mg (01/08/21 2252)     LOS: 24 days    Time spent: 35 minutes   Berton Mount, MD [facility] P8/19/2022, 8:56 PM

## 2021-01-09 NOTE — Consult Note (Addendum)
[facility] Nephrology Consultation Note  Requesting MD: Dr Berton Mount Reason for consult: Proteinuria  HPI:  Ashley Camacho is a 30 y.o. female with 28 weeks of gestation G2 P1, type 1 diabetes diagnosed since age of 38, hypertension, DVT on anticoagulation who was admitted on 7/26 for hyperemesis gravidarum and severe malnutrition, seen as a consultation at the request of Dr. Dartha Lodge for the evaluation of proteinuria seen during lab done on 7/30. Per patient she has longstanding diabetes and was seen by nephrologist when she was young.  At that time she was found to have some proteinuria thought to be due to diabetic nephropathy.  Since then she lost follow-up.  She was recently hospitalized at [facility] for sepsis due to pneumonia and then hospitalized to [facility] for intractable nausea and vomiting.  Because of severe malnutrition and worsening symptoms she was transferred to [facility] on 7/26.  She is currently has nasal feeding tube. During hospitalization, she has AKI with creatinine level fluctuating between 1.2-1.4.  This was treated with IV fluid with intermittent diuretic.  The diuretics is on hold.  Urinalysis with urinary tract infection treated with antibiotics however UPC showed 1.4 g of proteinuria.  The liver enzymes and platelet count unremarkable.  The kidney ultrasound with moderate right hydronephrosis presumably due to physiologic without any renal calculi. She also has anemia receiving vitamin and oral iron. Patient reported that she is feeling fatigued and continues to have some nausea and vomiting.  Denies chest pain, shortness of breath, headache, dizziness, urinary complaint.  No history of lupus or known autoimmune disease.  Creatinine  Date/Time Value Ref Range Status  03/01/2013 06:44 PM 0.76 0.60 - 1.30 mg/dL Final   Creatinine, Ser  Date/Time Value Ref Range Status  01/09/2021 05:44 AM 1.38 (H) 0.44 - 1.00 mg/dL Final  04/88/8916 94:50 AM  1.34 (H) 0.44 - 1.00 mg/dL Final  38/88/2800 34:91 AM 1.36 (H) 0.44 - 1.00 mg/dL Final  79/15/0569 79:48 AM 1.28 (H) 0.44 - 1.00 mg/dL Final  01/65/5374 82:70 PM 1.31 (H) 0.44 - 1.00 mg/dL Final  78/67/5449 20:10 AM 1.27 (H) 0.44 - 1.00 mg/dL Final  12/02/1973 88:32 PM 1.26 (H) 0.44 - 1.00 mg/dL Final  54/98/2641 58:30 AM 1.25 (H) 0.44 - 1.00 mg/dL Final  94/11/6806 81:10 AM 1.32 (H) 0.44 - 1.00 mg/dL Final  31/59/4585 92:92 AM 1.41 (H) 0.44 - 1.00 mg/dL Final     Past Medical History:  Diagnosis Date   Anemia    Gastroparesis    Hypertension    Type 1 diabetes (HCC)     Past Surgical History:  Procedure Laterality Date   CESAREAN SECTION     MOUTH SURGERY      Family Hx:  Family History  Problem Relation Age of Onset   Diabetes type I Father    CAD Father    CAD Paternal Grandmother    CAD Paternal Grandfather    Breast cancer Mother    Ovarian cancer Neg Hx     Social History:  reports that she has never smoked. She has never used smokeless tobacco. She reports that she does not drink alcohol and does not use drugs.  Allergies: No Known Allergies  Medications: Prior to Admission medications   Medication Sig Start Date End Date Taking? Authorizing Provider  aluminum-petrolatum-zinc (1-2-3 PASTE) 0.027-13.7-12.5% PSTE paste Apply 1 application topically 3 (three) times daily. 12/16/20  Yes Darlin Priestly, MD  enoxaparin (LOVENOX) 80 MG/0.8ML injection Inject 0.8 mLs (80  mg total) into the skin every 12 (twelve) hours. 12/16/20  Yes Darlin Priestly, MD  famotidine (PEPCID) 20-0.9 MG/50ML-% Inject 50 mLs (20 mg total) into the vein every 12 (twelve) hours. 12/16/20  Yes Darlin Priestly, MD  insulin aspart (NOVOLOG FLEXPEN) 100 UNIT/ML FlexPen Inject 200-350 Units into the skin See admin instructions. 200-250: add 2u. 251-300: add 4u. 301-350: add 8u. > 400: Call MD. 10/30/19  Yes [provider]  mirtazapine (REMERON SOL-TAB) 15 MG disintegrating tablet Take 1 tablet (15 mg total) by  mouth at bedtime. 12/16/20  Yes Darlin Priestly, MD  ondansetron [facility]) 4 MG/2ML SOLN injection Inject 2 mLs (4 mg total) into the vein every 8 (eight) hours. 12/16/20  Yes Darlin Priestly, MD  Prenatal Vit-Fe Fumarate-FA (PRENATAL MULTIVITAMIN) TABS tablet Take 1 tablet by mouth daily at 12 noon.   Yes [provider]  prochlorperazine (COMPAZINE) 10 MG/2ML injection Inject 2 mLs (10 mg total) into the vein every 6 (six) hours as needed. Patient taking differently: Inject 10 mg into the vein every 6 (six) hours as needed (nausea / vomiting.). 12/16/20  Yes Darlin Priestly, MD  sodium chloride 0.9 % SOLN 50 mL with promethazine 25 MG/ML SOLN 25 mg Inject 25 mg into the vein every 6 (six) hours as needed. Patient taking differently: Inject 25 mg into the vein every 6 (six) hours as needed (nausea / vomiting). 12/16/20  Yes Darlin Priestly, MD  ferrous sulfate 324 MG TBEC Take 324 mg by mouth. Patient not taking: No sig reported    [provider]    I have reviewed the patient's current medications.  Labs:  Results for orders placed or performed during the hospital encounter of 12/16/20 (from the past 48 hour(s))  Occult blood gastric / duodenum     Status: Abnormal   Collection Time: 01/08/21  2:13 AM  Result Value Ref Range   pH, Gastric NOT DONE    Occult Blood, Gastric POSITIVE (A) NEGATIVE    Comment: Performed at [facility], [address] [place], [place] [address]  Basic metabolic panel     Status: Abnormal   Collection Time: 01/08/21  3:20 AM  Result Value Ref Range   Sodium 141 135 - 145 mmol/L   Potassium 5.0 3.5 - 5.1 mmol/L   Chloride 111 98 - 111 mmol/L   CO2 22 22 - 32 mmol/L   Glucose, Bld 97 70 - 99 mg/dL    Comment: Glucose reference range applies only to samples taken after fasting for at least 8 hours.   BUN 34 (H) 6 - 20 mg/dL   Creatinine, Ser 0.76 (H) 0.44 - 1.00 mg/dL   Calcium 8.4 (L) 8.9 - 10.3 mg/dL   GFR, Estimated 55 (L) >60 mL/min    Comment:  (NOTE) Calculated using the CKD-EPI Creatinine Equation (2021)    Anion gap 8 5 - 15    Comment: Performed at [facility], [address], [place], [place] [address]  Hemoglobin and hematocrit, blood     Status: Abnormal   Collection Time: 01/08/21  3:20 AM  Result Value Ref Range   Hemoglobin 8.1 (L) 12.0 - 15.0 g/dL   HCT 35.4 (L) 56.2 - 56.3 %    Comment: Performed at [facility], [address], [place], [place] [address]  Type and screen [facility]     Status: None   Collection Time: 01/08/21  3:25 AM  Result Value Ref Range  ABO/RH(D) A POS    Antibody Screen NEG    Sample Expiration      01/11/2021,2359 Performed at [facility], [address] [place], [place] [address]   Glucose, capillary     Status: None   Collection Time: 01/08/21  6:21 AM  Result Value Ref Range   Glucose-Capillary 98 70 - 99 mg/dL    Comment: Glucose reference range applies only to samples taken after fasting for at least 8 hours.  Hemoglobin and hematocrit, blood     Status: None   Collection Time: 01/08/21  3:15 PM  Result Value Ref Range   Hemoglobin 12.9 12.0 - 15.0 g/dL    Comment: REPEATED TO VERIFY RESULTS VERIFIED VIA RECOLLECT    HCT 40.8 36.0 - 46.0 %    Comment: Performed at [facility], [address], [place], [place] [address]  Hemoglobin and hematocrit, blood     Status: Abnormal   Collection Time: 01/08/21  5:23 PM  Result Value Ref Range   Hemoglobin 7.3 (L) 12.0 - 15.0 g/dL    Comment: REPEATED TO VERIFY DELTA CHECK NOTED OK PER RN    HCT 22.7 (L) 36.0 - 46.0 %    Comment: Performed at [facility], [address], [place], [place] [address]  Hemoglobin and hematocrit, blood     Status: Abnormal   Collection Time: 01/09/21  1:28 AM  Result Value Ref Range   Hemoglobin 7.3 (L) 12.0 - 15.0 g/dL   HCT 42.3 (L) 53.6 - 14.4 %    Comment: Performed at [facility], [address], [place], [place] [address]  Basic  metabolic panel     Status: Abnormal   Collection Time: 01/09/21  5:44 AM  Result Value Ref Range   Sodium 139 135 - 145 mmol/L   Potassium 4.9 3.5 - 5.1 mmol/L   Chloride 111 98 - 111 mmol/L   CO2 21 (L) 22 - 32 mmol/L   Glucose, Bld 111 (H) 70 - 99 mg/dL    Comment: Glucose reference range applies only to samples taken after fasting for at least 8 hours.   BUN 36 (H) 6 - 20 mg/dL   Creatinine, Ser 0.86 (H) 0.44 - 1.00 mg/dL   Calcium 8.1 (L) 8.9 - 10.3 mg/dL   GFR, Estimated 53 (L) >60 mL/min    Comment: (NOTE) Calculated using the CKD-EPI Creatinine Equation (2021)    Anion gap 7 5 - 15    Comment: Performed at [facility], [address] [place], [place] [address]  Hemoglobin and hematocrit, blood     Status: Abnormal   Collection Time: 01/09/21  5:44 AM  Result Value Ref Range   Hemoglobin 7.4 (L) 12.0 - 15.0 g/dL   HCT 09.3 (L) 26.7 - 12.4 %    Comment: Performed at [facility], [address], [place], [place] [address]  Glucose, capillary     Status: Abnormal   Collection Time: 01/09/21  6:25 AM  Result Value Ref Range   Glucose-Capillary 121 (H) 70 - 99 mg/dL    Comment: Glucose reference range applies only to samples taken after fasting for at least 8 hours.     ROS:  Pertinent items noted in HPI and remainder of comprehensive ROS otherwise negative.  Physical Exam: Vitals:   01/09/21 1145 01/09/21 1523  BP: (!) 126/59 (!) 121/54  Pulse: 87 89  Resp: 16 16  Temp: 97.7 F (36.5  C) 97.8 F (36.6 C)  SpO2: 98% 98%     General exam: Appears calm and comfortable, with nasal feeding tube Respiratory system: Clear to auscultation. Respiratory effort normal. No wheezing or crackle Cardiovascular system: S1 & S2 heard, RRR.  Bilateral pedal edema present Gastrointestinal system: Abdomen is soft and distended with pregnancy. Central nervous system: Alert and oriented. No focal neurological deficits. Extremities: No cyanosis or clubbing.  Edema  present Skin: No rashes, lesions or ulcers Psychiatry: Judgement and insight appear normal. Mood & affect appropriate.   Assessment/Plan:  #Sub-nephrotic range proteinuria presumably due to diabetic nephropathy: UPC with 1.4 g of proteinuria.  Patient reported that she has known proteinuria since young age and was seen by nephrologist before thought to be due to Diabetes  Blood pressure acceptable. Repeating UA and agree with 24-hour urine collection for proteinuria. Check ANA, antidsDNA, complement to rule out lupus. Agree with checking hepatitis panel. Avoid ACE inhibitor or ARB.  #Acute kidney injury probably hemodynamically mediated in the setting of intractable nausea vomiting and hypoalbuminemia, anemia concomitant with recent hospitalization with sepsis/infection. LDH, platelet count and liver enzymes acceptable therefore no HELLP syndrome. Repeating urinary studies as above. It seems like the creatinine level is stable in the last few weeks.  Avoid hypotensive episode, NSAID's, IV contrast.  I will try not to use diuretics to manage lower extremity edema.  Attempt few doses of IV albumin to increase intravascular volume and increase protein intake.  #Intractable nausea vomiting and severe protein calorie malnutrition: Probably also has underlying gastroparesis due to diabetes.  Continue supportive care.  She is currently on nasal feeding tube.  #Lower extremity edema: Avoid diuretics during pregnancy if possible.  Recommend high-protein diet from the tube feed.  I will try to dose of IV albumin.  #Type 1 diabetes with chronic proteinuria: Currently on insulin drip.  #Pregnancy: Followed by OB team.  Continue vitamin and oral supplement per them.  #Anemia: On oral iron and vitamin.  Transfusion as per primary team.  #Urinary tract infection on amoxicillin for 7 days.  Discussed with the primary team.  Thank you for the consult.  Trenna Kiely Jaynie Collins 01/09/2021, 4:58 PM   [facility].

## 2021-01-09 NOTE — Progress Notes (Signed)
FACULTY PRACTICE ANTEPARTUM COMPREHENSIVE PROGRESS NOTE  Ashley Camacho is a 30 y.o. G2P0101 at [redacted]w[redacted]d who is admitted for chronic N/V in s/o T1DM, CHTN, malnutrition and anemia in s/o  intractable nausea/vomiting..  Estimated Date of Delivery: 03/30/21 Fetal presentation is cephalic.  Length of Stay:  24 Days. Admitted 12/16/2020  Subjective:  No change, tolerating some PO. Denies abdominal pain, CP, SOB. Denies HA/BV.  Notes ongoing right leg pain - same leg as TDAP but pain started prior to this. LE doppler negative. Oxycodone helps, flexeril does not.   Patient reports good fetal movement.  She reports no uterine contractions, no bleeding and no loss of fluid per vagina.  Vitals:  Blood pressure (!) 125/55, pulse 88, temperature 98.2 F (36.8 C), temperature source Oral, resp. rate 18, height 5\' 1"  (1.549 m), weight 69.1 kg, SpO2 97 %. Physical Examination: CONSTITUTIONAL: Well-developed, well-nourished female in no acute distress.  NEUROLOGIC: Alert and oriented to person, place, and time. No cranial nerve deficit noted. PSYCHIATRIC: Normal mood and affect. Normal behavior. Normal judgment and thought content. CARDIOVASCULAR: Normal heart rate noted, regular rhythm RESPIRATORY: Effort and breath sounds normal, no problems with respiration noted MUSCULOSKELETAL: Normal range of motion. no tenderness. 2+ distal pulses. Right leg is nontender on exam, skin is not tender, bilaterally edema noted ABDOMEN: Soft, nontender, nondistended, gravid.    Fetal monitoring: FHR: 130 bpm, Variability: moderate, Accelerations: Present, Decelerations: Absent  Uterine activity: 1 contractions per hour    VAS LOWER EXTREMITY VENOUS (DVT)  Result Date: 01/08/2021  Lower Venous DVT Study Patient Name:  Ashley Camacho  Date of Exam:   01/08/2021 Medical Rec #: 01/10/2021        Accession #:    [id number] Date of Birth: 1991-01-29       Patient Gender: F Patient Age:   74 years Exam Location:  [facility] Procedure:      VAS [facility] LOWER EXTREMITY VENOUS (DVT) Referring Phys: [place] --------------------------------------------------------------------------------  Indications: Edema, and [redacted] weeks pregnant.  Risk Factors: Hyperemesis gravidarum. Limitations: Poor ultrasound/tissue interface and interstitial fluid. Comparison Study: No prior study Performing Technologist: Milas Hock MHA, RDMS, RVT, RDCS  Examination Guidelines: A complete evaluation includes B-mode imaging, spectral Doppler, color Doppler, and power Doppler as needed of all accessible portions of each vessel. Bilateral testing is considered an integral part of a complete examination. Limited examinations for reoccurring indications may be performed as noted. The reflux portion of the exam is performed with the patient in reverse Trendelenburg.  +---------+---------------+---------+-----------+----------+--------------+ RIGHT    CompressibilityPhasicitySpontaneityPropertiesThrombus Aging +---------+---------------+---------+-----------+----------+--------------+ CFV      Full           Yes      Yes                                 +---------+---------------+---------+-----------+----------+--------------+ SFJ      Full                                                        +---------+---------------+---------+-----------+----------+--------------+ FV Prox  Full                                                        +---------+---------------+---------+-----------+----------+--------------+  FV Mid   Full                                                        +---------+---------------+---------+-----------+----------+--------------+ FV DistalFull                                                        +---------+---------------+---------+-----------+----------+--------------+ PFV      Full                                                         +---------+---------------+---------+-----------+----------+--------------+ POP      Full           Yes      Yes                                 +---------+---------------+---------+-----------+----------+--------------+ PTV      Full                                                        +---------+---------------+---------+-----------+----------+--------------+ PERO     Full                                                        +---------+---------------+---------+-----------+----------+--------------+   +----+---------------+---------+-----------+----------+--------------+ LEFTCompressibilityPhasicitySpontaneityPropertiesThrombus Aging +----+---------------+---------+-----------+----------+--------------+ CFV Full           Yes      Yes                                 +----+---------------+---------+-----------+----------+--------------+    Summary: RIGHT: - There is no evidence of deep vein thrombosis in the lower extremity.  - No cystic structure found in the popliteal fossa.  LEFT: - No evidence of common femoral vein obstruction.  *See table(s) above for measurements and observations.    Preliminary      Current scheduled medications  amoxicillin  500 mg Per Tube Q8H   Chlorhexidine Gluconate Cloth  6 each Topical Daily   doxylamine (Sleep)  12.5 mg Oral QHS   enoxaparin  70 mg Subcutaneous Q12H   feeding supplement (PROSource TF)  45 mL Per Tube BID   ferrous sulfate  300 mg Per Tube QODAY   folic acid  1 mg Per Tube Daily   hydrALAZINE  25 mg Per Tube Q8H   mouth rinse  15 mL Mouth Rinse q12n4p   metoCLOPramide (REGLAN) injection  5 mg Intravenous Q6H   pantoprazole (PROTONIX) IV  40 mg Intravenous Q12H   prenatal vitamin w/FE, FA  1 tablet Oral Q1200  scopolamine  1 patch Transdermal Q72H   sodium chloride flush  10-40 mL Intracatheter Q12H   sodium chloride flush  3 mL Intravenous Q12H   thiamine  100 mg Per Tube Daily   vitamin B-6  12.5 mg Oral  QHS    I have reviewed the patient's current medications.  ASSESSMENT: Active Problems:   Delivery with history of C-section   Anemia affecting pregnancy in second trimester   AKI (acute kidney injury) (HCC)   Hyperemesis   Pregnancy with 24 completed weeks gestation   Diabetes type 1, uncontrolled (HCC)   Starvation ketoacidosis   Acute deep vein thrombosis (DVT) of brachial vein of right upper extremity (HCC)   Acute respiratory failure with hypoxia (HCC)   Vitreous hemorrhage of right eye (HCC)   Gastroparesis due to DM (HCC)   Diabetic retinopathy (HCC)   Acute on chronic combined systolic and diastolic CHF (congestive heart failure) (HCC)   Type 1 diabetes mellitus affecting pregnancy in second trimester, antepartum   HFrEF (heart failure with reduced ejection fraction) (HCC)   [redacted] weeks gestation of pregnancy   Acute respiratory distress   Anemia during pregnancy   Pre-existing type 1 diabetes mellitus during pregnancy in second trimester   Pregnancy with 28 completed weeks gestation   History of preterm delivery, currently pregnant   PLAN: Routine Pregnancy - s/p tdap   FWB - Continue NST daily. NST reactive from yesterday. Today's not yet done - BPP weekly, done 8/8 with continued poly and echogenic foci in the liver - Growth next 8/22, last growth 8/1 was 59%ile - BMZ not given due to DM   MOD - RLTCS no later than 34w - Declines BTL. Declines IUD - poor reaction in the past. I counseled on other options I.e. Nexplanon.  - She is undecided at this time.    Chronic NV - Scheduled reglan, scop patch and phenergan - Prn compazine and zofran - Tube feeds via cortrack   T1DM - Managed by internal medicine with insulin/endotool   E. Faecalis UTI - on amoxicillin po bid started on 8/15 for 7 days   Anemia - Stable on last check. Would consider rechecking labs at least 1-2 weekly   AKI - Stable per primary team   H/o right brachial VTE - Therapeutic  lovenox   Chronic but stable CF - Last echo EF was 50-55%, cardiology signed off.    11. Right leg pain - LE doppler negative, no pain on exam - No obstetric cause, would be unusual from edema since not bilateral - Will also have PT assess    Continue routine antenatal care.   Milas Hock, MD, FACOG Obstetrician & Gynecologist, [facility], [facility]

## 2021-01-10 DIAGNOSIS — R609 Edema, unspecified: Secondary | ICD-10-CM

## 2021-01-10 DIAGNOSIS — Z3A28 28 weeks gestation of pregnancy: Secondary | ICD-10-CM

## 2021-01-10 DIAGNOSIS — Z978 Presence of other specified devices: Secondary | ICD-10-CM

## 2021-01-10 LAB — URINALYSIS, ROUTINE W REFLEX MICROSCOPIC
Bilirubin Urine: NEGATIVE
Glucose, UA: NEGATIVE mg/dL
Hgb urine dipstick: NEGATIVE
Ketones, ur: NEGATIVE mg/dL
Nitrite: NEGATIVE
Protein, ur: NEGATIVE mg/dL
Specific Gravity, Urine: 1.017 (ref 1.005–1.030)
Squamous Epithelial / HPF: >50 — ABNORMAL HIGH (ref 0–5)
WBC, UA: >50 WBC/hpf — ABNORMAL HIGH (ref 0–5)
pH: 5 (ref 5.0–8.0)

## 2021-01-10 LAB — RENAL FUNCTION PANEL
Albumin: 2.8 g/dL — ABNORMAL LOW (ref 3.5–5.0)
Anion gap: 6 (ref 5–15)
BUN: 39 mg/dL — ABNORMAL HIGH (ref 6–20)
CO2: 20 mmol/L — ABNORMAL LOW (ref 22–32)
Calcium: 8.2 mg/dL — ABNORMAL LOW (ref 8.9–10.3)
Chloride: 109 mmol/L (ref 98–111)
Creatinine, Ser: 1.47 mg/dL — ABNORMAL HIGH (ref 0.44–1.00)
GFR, Estimated: 49 mL/min — ABNORMAL LOW (ref >60)
Glucose, Bld: 91 mg/dL (ref 70–99)
Phosphorus: 5.9 mg/dL — ABNORMAL HIGH (ref 2.5–4.6)
Potassium: 5 mmol/L (ref 3.5–5.1)
Sodium: 135 mmol/L (ref 135–145)

## 2021-01-10 LAB — CBC WITH DIFFERENTIAL/PLATELET
Abs Immature Granulocytes: 0.02 10*3/uL (ref 0.00–0.07)
Basophils Absolute: 0 10*3/uL (ref 0.0–0.1)
Basophils Relative: 1 %
Eosinophils Absolute: 0.3 10*3/uL (ref 0.0–0.5)
Eosinophils Relative: 8 %
HCT: 21.9 % — ABNORMAL LOW (ref 36.0–46.0)
Hemoglobin: 6.9 g/dL — CL (ref 12.0–15.0)
Immature Granulocytes: 1 %
Lymphocytes Relative: 16 %
Lymphs Abs: 0.7 10*3/uL (ref 0.7–4.0)
MCH: 30 pg (ref 26.0–34.0)
MCHC: 31.5 g/dL (ref 30.0–36.0)
MCV: 95.2 fL (ref 80.0–100.0)
Monocytes Absolute: 0.3 10*3/uL (ref 0.1–1.0)
Monocytes Relative: 6 %
Neutro Abs: 2.9 10*3/uL (ref 1.7–7.7)
Neutrophils Relative %: 68 %
Platelets: 185 10*3/uL (ref 150–400)
RBC: 2.3 MIL/uL — ABNORMAL LOW (ref 3.87–5.11)
RDW: 16.3 % — ABNORMAL HIGH (ref 11.5–15.5)
WBC: 4.2 10*3/uL (ref 4.0–10.5)
nRBC: 0 % (ref 0.0–0.2)

## 2021-01-10 LAB — FIBRINOGEN: Fibrinogen: 699 mg/dL — ABNORMAL HIGH (ref 210–475)

## 2021-01-10 LAB — APTT: aPTT: 52 seconds — ABNORMAL HIGH (ref 24–36)

## 2021-01-10 LAB — GLUCOSE, CAPILLARY: Glucose-Capillary: 97 mg/dL (ref 70–99)

## 2021-01-10 LAB — PREPARE RBC (CROSSMATCH)

## 2021-01-10 LAB — GLUCOSE, FASTING GESTATIONAL: Glucose Tolerance, Fasting: 91 mg/dL

## 2021-01-10 LAB — SODIUM, URINE, RANDOM: Sodium, Ur: 32 mmol/L

## 2021-01-10 LAB — PROTIME-INR
INR: 0.9 (ref 0.8–1.2)
Prothrombin Time: 12.3 seconds (ref 11.4–15.2)

## 2021-01-10 LAB — PROTEIN / CREATININE RATIO, URINE
Creatinine, Urine: 81.9 mg/dL
Protein Creatinine Ratio: 0.29 mg/mg{Cre} — ABNORMAL HIGH (ref 0.00–0.15)
Total Protein, Urine: 24 mg/dL

## 2021-01-10 MED ORDER — DIPHENHYDRAMINE HCL 25 MG PO CAPS
25.0000 mg | ORAL_CAPSULE | Freq: Once | ORAL | Status: AC
  Administered 2021-01-10: 25 mg via ORAL
  Filled 2021-01-10: qty 1

## 2021-01-10 MED ORDER — ACETAMINOPHEN 650 MG RE SUPP
650.0000 mg | Freq: Four times a day (QID) | RECTAL | Status: DC | PRN
  Filled 2021-01-10: qty 1

## 2021-01-10 MED ORDER — SODIUM CHLORIDE 0.9% IV SOLUTION: Freq: Once | INTRAVENOUS | Status: DC

## 2021-01-10 MED ORDER — ACETAMINOPHEN 160 MG/5ML PO SOLN
650.0000 mg | Freq: Four times a day (QID) | ORAL | Status: DC | PRN
  Administered 2021-01-10 – 2021-01-12 (×6): 650 mg
  Filled 2021-01-10 (×6): qty 20.3

## 2021-01-10 MED ORDER — ACETAMINOPHEN 325 MG PO TABS
650.0000 mg | ORAL_TABLET | Freq: Once | ORAL | Status: AC
  Administered 2021-01-10: 650 mg via ORAL
  Filled 2021-01-10: qty 2

## 2021-01-10 NOTE — Progress Notes (Signed)
FACULTY PRACTICE ANTEPARTUM(COMPREHENSIVE) NOTE  Ashley Camacho is a 30 y.o. G2P0101 with Estimated Date of Delivery: 03/30/21   By  LMP [redacted]w[redacted]d  who is admitted for chronic nausea/vomiting with T1DM, cHTN, malnutrition and anemia.    Fetal presentation is cephalic. Length of Stay:  25  Days  Date of admission:12/16/2020  Subjective: Resting comfortably in bed.  Tolerating some sips/clears.  Denies nausea/vomiting currently.  Pt had been reporting leg pain previously states it's less than before, currently 4-5/10.  No acute complaints this am. Patient reports the fetal movement as active. Patient reports uterine contraction  activity as none. Patient reports  vaginal bleeding as none. Patient describes fluid per vagina as None.  Vitals:  Blood pressure (!) 135/55, pulse 92, temperature 97.6 F (36.4 C), temperature source Axillary, resp. rate 16, height 5\' 1"  (1.549 m), weight 72.2 kg, SpO2 97 %. Vitals:   01/09/21 1523 01/09/21 2010 01/10/21 0002 01/10/21 0457  BP: (!) 121/54 133/61 (!) 131/59 (!) 135/55  Pulse: 89 87 88 92  Resp: 16  16 16   Temp: 97.8 F (36.6 C) (!) 97.5 F (36.4 C) 98 F (36.7 C) 97.6 F (36.4 C)  TempSrc: Oral Oral Oral Axillary  SpO2: 98%  97%   Weight:    72.2 kg  Height:       Physical Examination:  General appearance - alert, well appearing, and in no distress Mental status - normal mood, behavior, speech, dress, motor activity, and thought processes HEENT: NG tube in place Chest - CTAB Heart - normal rate and regular rhythm Abdomen - gravid, soft and non-tender Extremities - 2+ edema bilaterally, no calf tenderness bilaterally, no erythema Skin - warm and dry  Fetal Monitoring:  Baseline: 130 bpm, Variability: moderate, Accelerations: +10x10 accels, and Decelerations: Absent   reactive  Labs:  Results for orders placed or performed during the hospital encounter of 12/16/20 (from the past 24 hour(s))  Hepatitis panel, acute   Collection Time:  01/09/21  4:42 PM  Result Value Ref Range   Hepatitis B Surface Ag NON REACTIVE NON REACTIVE   HCV Ab NON REACTIVE NON REACTIVE   Hep A IgM NON REACTIVE NON REACTIVE   Hep B C IgM NON REACTIVE NON REACTIVE  Protime-INR   Collection Time: 01/09/21  4:42 PM  Result Value Ref Range   Prothrombin Time 13.5 11.4 - 15.2 seconds   INR 1.0 0.8 - 1.2  Sodium, urine, random   Collection Time: 01/10/21  1:30 AM  Result Value Ref Range   Sodium, Ur 32 mmol/L  Urinalysis, Routine w reflex microscopic Urine, Clean Catch   Collection Time: 01/10/21  1:31 AM  Result Value Ref Range   Color, Urine AMBER (A) YELLOW   APPearance CLOUDY (A) CLEAR   Specific Gravity, Urine 1.017 1.005 - 1.030   pH 5.0 5.0 - 8.0   Glucose, UA NEGATIVE NEGATIVE mg/dL   Hgb urine dipstick NEGATIVE NEGATIVE   Bilirubin Urine NEGATIVE NEGATIVE   Ketones, ur NEGATIVE NEGATIVE mg/dL   Protein, ur NEGATIVE NEGATIVE mg/dL   Nitrite NEGATIVE NEGATIVE   Leukocytes,Ua SMALL (A) NEGATIVE   RBC / HPF 21-50 0 - 5 RBC/hpf   WBC, UA >50 (H) 0 - 5 WBC/hpf   Bacteria, UA FEW (A) NONE SEEN   Squamous Epithelial / LPF >50 (H) 0 - 5   Non Squamous Epithelial 0-5 (A) NONE SEEN  Protein / creatinine ratio, urine   Collection Time: 01/10/21  1:31 AM  Result Value  Ref Range   Creatinine, Urine 81.90 mg/dL   Total Protein, Urine 24 mg/dL   Protein Creatinine Ratio 0.29 (H) 0.00 - 0.15 mg/mg[Cre]  Glucose, capillary   Collection Time: 01/10/21  5:06 AM  Result Value Ref Range   Glucose-Capillary 97 70 - 99 mg/dL  Renal function panel   Collection Time: 01/10/21  6:10 AM  Result Value Ref Range   Sodium 135 135 - 145 mmol/L   Potassium 5.0 3.5 - 5.1 mmol/L   Chloride 109 98 - 111 mmol/L   CO2 20 (L) 22 - 32 mmol/L   Glucose, Bld 91 70 - 99 mg/dL   BUN 39 (H) 6 - 20 mg/dL   Creatinine, Ser 1.02 (H) 0.44 - 1.00 mg/dL   Calcium 8.2 (L) 8.9 - 10.3 mg/dL   Phosphorus 5.9 (H) 2.5 - 4.6 mg/dL   Albumin 2.8 (L) 3.5 - 5.0 g/dL   GFR,  Estimated 49 (L) >60 mL/min   Anion gap 6 5 - 15  Glucose, fasting gestational   Collection Time: 01/10/21  6:10 AM  Result Value Ref Range   Glucose, Fasting-Gestational 91 mg/dL    Imaging Studies:    US RENAL  Result Date: 01/09/2021 CLINICAL DATA:  Acute kidney injury. EXAM: RENAL / URINARY TRACT ULTRASOUND COMPLETE COMPARISON:  01/05/2021 FINDINGS: Right Kidney: Renal measurements: 11.3 x 5.8 x 5.6 cm = volume: 191 mL. Moderate hydronephrosis of the right kidney similar to prior study. Normal parenchymal thickness and echotexture. No focal mass lesions. Left Kidney: Renal measurements: 9.1 x 4.4 x 4.1 cm = volume: 87 mL. Echogenicity within normal limits. No mass or hydronephrosis visualized. Bladder: Appears normal for degree of bladder distention. Other: Small bilateral pleural effusions are incidentally demonstrated. IMPRESSION: 1. Moderate right hydronephrosis similar to prior study. 2. Small bilateral pleural effusions. Electronically Signed   By: Burman Nieves M.D.   On: 01/09/2021 19:41   VAS [place] LOWER EXTREMITY VENOUS (DVT)  Result Date: 01/09/2021  Lower Venous DVT Study Patient Name:  Ashley Camacho  Date of Exam:   01/08/2021 Medical Rec #: [id number]        Accession #:    [id number] Date of Birth: 1990/07/30       Patient Gender: F Patient Age:   7 years Exam Location:  [facility] Procedure:      VAS [place] LOWER EXTREMITY VENOUS (DVT) Referring Phys: Milas Hock --------------------------------------------------------------------------------  Indications: Edema, and [redacted] weeks pregnant.  Risk Factors: Hyperemesis gravidarum. Limitations: Poor ultrasound/tissue interface and interstitial fluid. Comparison Study: No prior study Performing Technologist: Gertie Fey MHA, RDMS, RVT, RDCS  Examination Guidelines: A complete evaluation includes B-mode imaging, spectral Doppler, color Doppler, and power Doppler as needed of all accessible portions of each vessel. Bilateral  testing is considered an integral part of a complete examination. Limited examinations for reoccurring indications may be performed as noted. The reflux portion of the exam is performed with the patient in reverse Trendelenburg.  +---------+---------------+---------+-----------+----------+--------------+ RIGHT    CompressibilityPhasicitySpontaneityPropertiesThrombus Aging +---------+---------------+---------+-----------+----------+--------------+ CFV      Full           Yes      Yes                                 +---------+---------------+---------+-----------+----------+--------------+ SFJ      Full                                                        +---------+---------------+---------+-----------+----------+--------------+  FV Prox  Full                                                        +---------+---------------+---------+-----------+----------+--------------+ FV Mid   Full                                                        +---------+---------------+---------+-----------+----------+--------------+ FV DistalFull                                                        +---------+---------------+---------+-----------+----------+--------------+ PFV      Full                                                        +---------+---------------+---------+-----------+----------+--------------+ POP      Full           Yes      Yes                                 +---------+---------------+---------+-----------+----------+--------------+ PTV      Full                                                        +---------+---------------+---------+-----------+----------+--------------+ PERO     Full                                                        +---------+---------------+---------+-----------+----------+--------------+   +----+---------------+---------+-----------+----------+--------------+  LEFTCompressibilityPhasicitySpontaneityPropertiesThrombus Aging +----+---------------+---------+-----------+----------+--------------+ CFV Full           Yes      Yes                                 +----+---------------+---------+-----------+----------+--------------+     Summary: RIGHT: - There is no evidence of deep vein thrombosis in the lower extremity.  - No cystic structure found in the popliteal fossa.  LEFT: - No evidence of common femoral vein obstruction.  *See table(s) above for measurements and observations. Electronically signed by Waverly Ferrari MD on 01/09/2021 at 2:38:53 PM.    Final      ASSESSMENT: G2P0101 [redacted]w[redacted]d Estimated Date of Delivery: 03/30/21  Active Problems:   History of C-section   Anemia affecting pregnancy in second trimester   AKI (acute kidney injury) (HCC)   Hyperemesis    Diabetes type 1    Acute deep vein thrombosis (DVT) of brachial  vein of right upper extremity (HCC)    Gastroparesis due to DM (HCC)   Diabetic retinopathy (HCC)   Acute on chronic combined systolic and diastolic CHF (congestive heart failure) (HCC)    [redacted] weeks gestation of pregnancy    PLAN: 1) OB care -continue BPP/NST weekly -next growth scan on 8/22 -FWB- reactive NST, continue daily monitoring -Plan for repeat C-section @ 34wks  2) Chronic nausea/vomiting -on scheduled reglan scop patch -zofran, compazine prn  3) FEN -feeding tube in place -management per IM  4) Type 1 DM -management per IM  5) UTI -on Amox twice daily x 7 days  6) Anemia -currently asymptomatic consider repeat q 1-2wks  7) h/o right brachia VTE -on therapeutic lovenox  8)chronic but stable CF Cardiology signed off  9) Right leg pain -stable, will continue to monitor -LE doppler negative, exam unremarkable  [name], DO Attending Obstetrician & Gynecologist, [facility] [employer], [facility]

## 2021-01-10 NOTE — Progress Notes (Signed)
Date and time results received: 01/10/21 1407   Test: Hemoglobin Critical Value: 6.9  Name of Provider Notified: Dr. Dartha Lodge and Dr.Bass  Orders Received? Paged Hospitalist and spoke with OBGYN to notify.Waiting on Orders.

## 2021-01-10 NOTE — Progress Notes (Addendum)
RN notified Dr. Toni Arthurs Hospitalist of frequent nosebleeds, oozing from Lovenox shot given last night, increase in right leg pain, episodes of urinary retention and RN concern for worsening kidney function, decrease in Hemoglobin and overall decrease in ADL function of patient in morning. RN secure chatted Hospitalist and paged throughout shift. OB along with Nephrology were added to secure chat. RN recommended draw of CBC and Coag studies. Hemoglobin with critical value. RN notified Hospitalist/ Dr. Toni Arthurs of which care was referred to [facility]. OB/Dr. Donavan Foil ordered 2 units PRBC's. RN requested foley from Hospitalist due to patient inability to urinate and discomfort and unable to collect 24 hour urine. Hospitalist referred to Nephrology. Nephrology okayed insert of foley catheter due to patient having inability to pee and with discomfort. OB, Nephrology, and Hospitalist aware of labs worsening and pt decline in function of ADL's. Pt now unable to bear weight on Right leg and having now to use bedside commode. Foley catheter inserted with patient much relieved. Continue to monitor.

## 2021-01-10 NOTE — Progress Notes (Signed)
PROGRESS NOTE    Ashley Camacho  PNT:[id number] DOB: 1991-01-31 DOA: 12/16/2020 PCP: Hildred Laser, MD   Chief Complain:Nausea,vomiting  Brief Narrative:  Patient is a 30 year old female G2,P1 at [redacted] weeks gestation who has a history of type 1 diabetes mellitus.  She presented to the emergency room for evaluation of persistent nausea and vomiting .  She was recently with multiple hospitalization, at [facility] for sepsis secondary to pneumonia and was there for 2 weeks.  Couple hospitalization at [facility] due to intractable nausea and vomiting. -Transferred from [facility] to [facility] 7/26 given her severe malnutrition, with hyperemesis gravidarum, for consideration of cortrak tube insertion.  Patient had core track inserted on admission, so far tolerating tube feed (she remains with significant nausea and vomiting, but this is at baseline and did not worsen with initiation of tube feed), as well her work-up was significant for acute CHF, requiring diuresis, cardiology were following.Continues to have intermittent  nausea and vomiting . Goal is to diet advancement and removal of feeding tube.Currently attemtping on clear liquid diet.She might end up staying here until delivery at 34 weeks.  01/09/2021: Patient seen alongside patient's nurse.  Available records reviewed.  Worsening proteinuria is noted.  Last UPC done on 12/20/2020 was suggestive of 1.43 proteinuria (up from 1.03 g proteinuria on 11/21/2020).  Blood pressure of 155/98 mmHg documented around 3 PM on 01/08/2021.  Current blood pressure is 121/54 mmHg.  Patient has significant bilateral lower extremity edema and serum albumin of 2.6.  Nephrology team has been consulted.  Discussed personally with Dr. Ronalee Belts.  Patient has had intermittent, chronic thrombocytopenia without any significant drop in the last few weeks or months.  Serum creatinine was 0.81 on 10/30/2018 and has gradually risen to 1.38 today.  Patient has been type I diabetic  since age of 76.  Blood sugars controlled today.  No reported seizure activity.  No further nausea or vomiting reported.  Patient is on Osmolite 1.5 via tube feeds.  Patient reports right lower leg pain, suspect neuropathic pain.  Doppler ultrasound of the lower extremities were reported as negative.  01/10/2021: Patient seen.  Nephrology input is highly appreciated.  Discussed extensively with the Nephrologist, Dron Jaynie Collins, MD.  Worsening renal function is noted.  Rising phosphorus level is noted.  CO2 is 20.  Potassium is 5.  Dr. Jaynie Collins is kind enough to communicate that he will follow the patient.  Nephrologist also inform the diet he also discussed with Dr. Burnadette Pop, previous hospitalist that was following the patient.  Renal ultrasound is noted (moderate right-sided hydronephrosis).  24-hour urine analysis results (protein) is pending.  Other work-up is pending.  Systolic blood pressure today is 135 mmHg.  Patient is nonoliguric.  Serum creatinine today is 1.47.  Patient denies shortness of breath.  Intermittent nosebleed is noted.  Urinalysis revealed WBC and RBC.  Urine sodium was 32.  Patient received albumin yesterday.  Assessment & Plan:   Active Problems:   Delivery with history of C-section   Anemia affecting pregnancy in second trimester   AKI (acute kidney injury) (HCC)   Hyperemesis   Pregnancy with 24 completed weeks gestation   Diabetes type 1, uncontrolled (HCC)   Starvation ketoacidosis   Acute deep vein thrombosis (DVT) of brachial vein of right upper extremity (HCC)   Acute respiratory failure with hypoxia (HCC)   Vitreous hemorrhage of right eye (HCC)   Gastroparesis due to DM (HCC)   Diabetic retinopathy (HCC)  Acute on chronic combined systolic and diastolic CHF (congestive heart failure) (HCC)   Type 1 diabetes mellitus affecting pregnancy in second trimester, antepartum   HFrEF (heart failure with reduced ejection fraction) (HCC)   [redacted] weeks  gestation of pregnancy   Acute respiratory distress   Anemia during pregnancy   Pre-existing type 1 diabetes mellitus during pregnancy in second trimester   Pregnancy with 28 completed weeks gestation   History of preterm delivery, currently pregnant   Leg edema   Intractable nausea/vomiting, severe protein calorie malnutrition, hyperemesis gravidarum/underlying gastroparesis: Presented with persistent nausea and vomiting.  Was on TPN previously which was discontinued on 7/28.  Now being fed through tube.  Tolerating tube feeding.    Was previously started on a steroid for hyperemesis gravidarum but it was discontinued because it did not help.  Currently on Reglan, Zofran, scopolamine, Protonix, Compazine, Phenergan.Also on pyridoxine. On clear liquid diet .  Diet was advanced to full liquid yesterday but she had vomiting so diet changed to clear liquid again Goal is to remove the tube feeding after she adequately tolerates solid food,might take long time 01/09/2021: No further nausea or vomiting reported.  See above documentation. 01/10/2021: Mild nausea reported, without significant vomiting.  Type 1 diabetes: Uncontrolled with hyperglycemia alternating with hyperglycemia.  On insulin drip.  Follows with endocrinologist and takes insulin at home.Diabetic coordinator were following,. 01/09/2021: See above documentation. 01/10/2021: Continue to optimize.  Cardiomyopathy/acute systolic congestive heart failure: Currently on hydralazine Has severe bilateral lower extremity edema.  Echo done on 12/28/2020 showed ejection fraction of 50 to 55%, small pericardial effusion.  Echo done on 7/22 had shown ejection fraction of 40 to 45%.  Cardiology were following,now signed off.  Lasix on hold due to AKI. Bilateral lower extremity edema also could have been contributed due to pregnancy, hypoalbuminemia. 01/09/2021: Last echocardiogram revealed, no significant findings reported.  Right upper extremity DVT: On  Lovenox  AKI on CKD stage II: Hospital course remarkable for AKI, diuresis on hold.  Continue to monitor.Creatinine  has plateaued around 1.2-1.3 01/09/2021: Technically, this remains acute kidney injury.  AKI slightly worsening.  Proteinuria is worsening.  Chronic thrombocytopenia, dull, intermittent.  Albumin of 2.6.  Nephrology team has been consulted.  Meanwhile, we will proceed with 24-hour urine collection to quantify protein, check urine sodium, obtain renal ultrasound.  We will defer other management to the nephrology team. 01/10/2021: Worsening acute kidney injury.  Nephrology team is directing care.  Normocytic anemia: Most likely  dilutional secondary to pregnancy hypervolemia/hypoalbuminemia.  Continue to monitor 01/10/2021: Repeat hemoglobin today 6.9 g/dL.  Will defer decision to transfuse packed red blood cells to the primary care provider.  Dark urine: Renal ultrasound did not show any hydronephrosis, no renal stones.  Urine culture showed Enterococcus faecalis, currently on amoxicillin.  Coffee-ground emesis: As per RN, noticed on the night of 8/18.  She is not actively bleeding.  No further episodes of coffee-ground emesis observed.  We will continue to monitor H&H.  Her hemoglobin this morning is stable.  Continue Protonix 40 mg IV twice daily for now 01/10/2021: Resolved.  Hemoglobin of 6.9 g/dL noted.  Suspect acute blood loss anemia.  Second trimester pregnancy: 28 weeks.  OB-GYN following.  Maternal-fetal medicine recommended daily fetal testing, monthly growth ultrasounds, delivery at 34 weeks, no antenatal corticosteroids.  Right lower extremity pain: -Worrisome for diabetic neuropathy. -Shortness of management to be challenging, considering the patient is pregnant. -At the best of times, this could take time to  improve. -Continue to optimize diabetes mellitus management. -We will discuss safety of available management options with the primary team (OB/GYN)  Nutrition  Problem: Inadequate oral intake Etiology: nausea, vomiting   DVT prophylaxis:Lovenox Code Status: Full Family Communication: Boyfriend at the bedside on 8.8.22 Status is: Inpatient  Remains inpatient appropriate because:Inpatient level of care appropriate due to severity of illness  Dispo: The patient is from: Home              Anticipated d/c is to: Home              Patient currently is not medically stable to d/c.   Difficult to place patient No  Unable to discharge the patient until she is able to tolerate solid oral food/removal of feeding tube   Consultants: Cardiology, GYN  Procedures: None  Antimicrobials:  Anti-infectives (From admission, onward)    Start     Dose/Rate Route Frequency Ordered Stop   01/06/21 1630  amoxicillin (AMOXIL) 250 MG/5ML suspension 500 mg        500 mg Per Tube Every 8 hours 01/06/21 1534 01/13/21 1359   01/06/21 1615  amoxicillin (AMOXIL) chewable tablet 500 mg  Status:  Discontinued        500 mg Oral Every 8 hours 01/06/21 1528 01/06/21 1532       Subjective: -Patient is more communicative today.   -Reported some nausea, but no vomiting.    Objective: Vitals:   01/10/21 0000 01/10/21 0002 01/10/21 0457 01/10/21 0759  BP:  (!) 131/59 (!) 135/55 (!) 118/56  Pulse:  88 92 86  Resp:  16 16 16   Temp:  98 F (36.7 C) 97.6 F (36.4 C) (!) 97.5 F (36.4 C)  TempSrc:  Oral Axillary Oral  SpO2: 96% 97%  97%  Weight:   72.2 kg   Height:         Intake/Output Summary (Last 24 hours) at 01/10/2021 1107 Last data filed at 01/10/2021 0457 Gross per 24 hour  Intake 1068.42 ml  Output 700 ml  Net 368.42 ml    Filed Weights   01/08/21 0556 01/09/21 0632 01/10/21 0457  Weight: 71.4 kg 69.1 kg 72.2 kg    Examination: General exam: Awake and alert.  Patient is not in any distress.  Edematous. HEENT: PERRL, feeding tube Respiratory system:  no wheezes or crackles  Cardiovascular system: S1 & S2 heard, RRR.  Gastrointestinal system:  Abdomen is nondistended, soft and nontender. Central nervous system: Alert and oriented Extremities: 2+ to 3 bilateral lower extremity pitting  edema  Data Reviewed: I have personally reviewed following labs and imaging studies  CBC: Recent Labs  Lab 01/05/21 0500 01/06/21 0450 01/08/21 0320 01/08/21 1515 01/08/21 1723 01/09/21 0128 01/09/21 0544  WBC 4.3 5.0  --   --   --   --   --   NEUTROABS 2.8  --   --   --   --   --   --   HGB 7.5* 8.2* 8.1* 12.9 7.3* 7.3* 7.4*  HCT 23.8* 26.1* 25.9* 40.8 22.7* 22.9* 22.6*  MCV 93.7 95.3  --   --   --   --   --   PLT 159 177  --   --   --   --   --     Basic Metabolic Panel: Recent Labs  Lab 01/05/21 0500 01/06/21 0450 01/08/21 0320 01/09/21 0544 01/10/21 0610  NA 142 142 141 139 135  K 5.0 4.8 5.0 4.9 5.0  CL 114* 112* 111 111 109  CO2 23 23 22  21* 20*  GLUCOSE 114* 101* 97 111* 91  91  BUN 35* 34* 34* 36* 39*  CREATININE 1.28* 1.36* 1.34* 1.38* 1.47*  CALCIUM 8.2* 8.6* 8.4* 8.1* 8.2*  PHOS  --   --   --   --  5.9*    GFR: Estimated Creatinine Clearance: 51.3 mL/min (A) (by C-G formula based on SCr of 1.47 mg/dL (H)). Liver Function Tests: Recent Labs  Lab 01/04/21 1645 01/06/21 0450 01/10/21 0610  AST 27 28  --   ALT 20 20  --   ALKPHOS 166* 163*  --   BILITOT 1.4* 1.5*  --   PROT 5.6* 5.6*  --   ALBUMIN 2.7* 2.6* 2.8*    No results for input(s): LIPASE, AMYLASE in the last 168 hours. No results for input(s): AMMONIA in the last 168 hours. Coagulation Profile: Recent Labs  Lab 01/09/21 1642  INR 1.0    Cardiac Enzymes: No results for input(s): CKTOTAL, CKMB, CKMBINDEX, TROPONINI in the last 168 hours. BNP (last 3 results) No results for input(s): PROBNP in the last 8760 hours. HbA1C: No results for input(s): HGBA1C in the last 72 hours. CBG: Recent Labs  Lab 01/06/21 1648 01/07/21 0608 01/08/21 0621 01/09/21 0625 01/10/21 0506  GLUCAP 117* 93 98 121* 97    Lipid Profile: No results for  input(s): CHOL, HDL, LDLCALC, TRIG, CHOLHDL, LDLDIRECT in the last 72 hours. Thyroid Function Tests: No results for input(s): TSH, T4TOTAL, FREET4, T3FREE, THYROIDAB in the last 72 hours. Anemia Panel: No results for input(s): VITAMINB12, FOLATE, FERRITIN, TIBC, IRON, RETICCTPCT in the last 72 hours. Sepsis Labs: No results for input(s): PROCALCITON, LATICACIDVEN in the last 168 hours.  Recent Results (from the past 240 hour(s))  OB Urine Culture     Status: Abnormal   Collection Time: 01/05/21  2:32 PM   Specimen: Urine, Random  Result Value Ref Range Status   Specimen Description URINE, RANDOM  Final   Special Requests NONE  Final   Culture (A)  Final    >=100,000 COLONIES/mL ENTEROCOCCUS FAECALIS NO GROUP B STREP (S.AGALACTIAE) ISOLATED Performed at [facility], [address] [place], [place]    Report Status 01/07/2021 FINAL  Final   Organism ID, Bacteria ENTEROCOCCUS FAECALIS (A)  Final      Susceptibility   Enterococcus faecalis - MIC*    AMPICILLIN <=2 SENSITIVE Sensitive     NITROFURANTOIN <=16 SENSITIVE Sensitive     VANCOMYCIN 2 SENSITIVE Sensitive     * >=100,000 COLONIES/mL ENTEROCOCCUS FAECALIS          Radiology Studies: 01/09/2021 RENAL  Result Date: 01/09/2021 CLINICAL DATA:  Acute kidney injury. EXAM: RENAL / URINARY TRACT ULTRASOUND COMPLETE COMPARISON:  01/05/2021 FINDINGS: Right Kidney: Renal measurements: 11.3 x 5.8 x 5.6 cm = volume: 191 mL. Moderate hydronephrosis of the right kidney similar to prior study. Normal parenchymal thickness and echotexture. No focal mass lesions. Left Kidney: Renal measurements: 9.1 x 4.4 x 4.1 cm = volume: 87 mL. Echogenicity within normal limits. No mass or hydronephrosis visualized. Bladder: Appears normal for degree of bladder distention. Other: Small bilateral pleural effusions are incidentally demonstrated. IMPRESSION: 1. Moderate right hydronephrosis similar to prior study. 2. Small bilateral pleural  effusions. Electronically Signed   By: 01/07/2021 M.D.   On: 01/09/2021 19:41   VAS 01/11/2021 LOWER EXTREMITY VENOUS (DVT)  Result Date: 01/09/2021  Lower Venous DVT Study Patient Name:  [name]  Date of Exam:   01/08/2021 Medical Rec #: [id number]        Accession #:    [id number] Date of Birth: [birth year]       Patient Gender: F Patient Age:   57 years Exam Location:  [facility] Procedure:      VAS [place] LOWER EXTREMITY VENOUS (DVT) Referring Phys: Milas Hock --------------------------------------------------------------------------------  Indications: Edema, and [redacted] weeks pregnant.  Risk Factors: Hyperemesis gravidarum. Limitations: Poor ultrasound/tissue interface and interstitial fluid. Comparison Study: No prior study Performing Technologist: Gertie Fey MHA, RDMS, RVT, RDCS  Examination Guidelines: A complete evaluation includes B-mode imaging, spectral Doppler, color Doppler, and power Doppler as needed of all accessible portions of each vessel. Bilateral testing is considered an integral part of a complete examination. Limited examinations for reoccurring indications may be performed as noted. The reflux portion of the exam is performed with the patient in reverse Trendelenburg.  +---------+---------------+---------+-----------+----------+--------------+ RIGHT    CompressibilityPhasicitySpontaneityPropertiesThrombus Aging +---------+---------------+---------+-----------+----------+--------------+ CFV      Full           Yes      Yes                                 +---------+---------------+---------+-----------+----------+--------------+ SFJ      Full                                                        +---------+---------------+---------+-----------+----------+--------------+ FV Prox  Full                                                        +---------+---------------+---------+-----------+----------+--------------+ FV Mid   Full                                                         +---------+---------------+---------+-----------+----------+--------------+ FV DistalFull                                                        +---------+---------------+---------+-----------+----------+--------------+ PFV      Full                                                        +---------+---------------+---------+-----------+----------+--------------+ POP      Full           Yes      Yes                                 +---------+---------------+---------+-----------+----------+--------------+ PTV      Full                                                        +---------+---------------+---------+-----------+----------+--------------+  ElmyrTMarland KitchenAShaMMarzet17 Ridge RoadTTAG>ra [phone] [address]ne[address]

## 2021-01-10 NOTE — Progress Notes (Addendum)
[facility]  Assessment/ Plan: Pt is a 30 y.o. yo female  with 28 weeks of gestation G2 P1, type 1 diabetes diagnosed since age of 66, known chronic proteinuria, hypertension, DVT on anticoagulation who was admitted on 7/26 for hyperemesis gravidarum and severe malnutrition, seen as a consultation at the for the evaluation of proteinuria and AKI.  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  Patient reported that she has known proteinuria since young age and was seen by nephrologist before thought to be due to Diabetes  Blood pressure acceptable. Repeat UA with UTI, already on abx.  Kidney ultrasound with a moderate right hydronephrosis which is probably physiologic in pregnancy.  Left kidney unremarkable. Checking 24-hour urine collection for proteinuria and serologies including ANA, antidsDNA, complement to rule out lupus.Hepatitis panel is negative.  Avoid ACE inhibitor or ARB.   #Acute kidney injury probably hemodynamically mediated in the setting of intractable nausea vomiting and hypoalbuminemia, anemia concomitant with recent hospitalization with sepsis/infection. LDH, platelet count and liver enzymes acceptable therefore no HELLP syndrome. It seems like the creatinine level ranging between 1.2-1.4 for last few weeks.  Ordered IV albumin to increase intravascular volume.  I will try to minimize the use of diuretics for her.  Recommend to avoid NSAIDs, IV contrast and hypotensive episode.  Bladder scan, discussed with the nurse.   #Intractable nausea vomiting and severe protein calorie malnutrition: Probably also has underlying gastroparesis due to diabetes.  Continue supportive care.  She is currently on nasal feeding tube.   #Lower extremity edema: Avoid diuretics during pregnancy if possible.  Recommend high-protein diet from the tube feed.  Supportive care.    #Type 1 diabetes with chronic proteinuria: Currently on insulin drip.   #Pregnancy: Followed by OB team.  Continue vitamin and oral supplement per them.   #Anemia: On oral iron and vitamin.  Transfusion as per primary team.   #Urinary tract infection on amoxicillin for 7 days.  Discussed with primary team Dr. Dartha Lodge.  Subjective: Seen and examined at bedside.  She is complaining of nausea and chronic fatigue.  Urine output is 700 cc.  No other new event. Objective Vital signs in last 24 hours: Vitals:   01/10/21 0002 01/10/21 0457 01/10/21 0759 01/10/21 1150  BP: (!) 131/59 (!) 135/55 (!) 118/56 (!) 108/57  Pulse: 88 92 86 87  Resp: 16 16 16 16   Temp: 98 F (36.7 C) 97.6 F (36.4 C) (!) 97.5 F (36.4 C) 97.7 F (36.5 C)  TempSrc: Oral Axillary Oral Oral  SpO2: 97%  97% 97%  Weight:  72.2 kg    Height:       Weight change: 3.067 kg  Intake/Output Summary (Last 24 hours) at 01/10/2021 1153 Last data filed at 01/10/2021 0457 Gross per 24 hour  Intake 1068.42 ml  Output 700 ml  Net 368.42 ml       Labs: Basic Metabolic Panel: Recent Labs  Lab 01/08/21 0320 01/09/21 0544 01/10/21 0610  NA 141 139 135  K 5.0 4.9 5.0  CL 111 111 109  CO2 22 21* 20*  GLUCOSE 97 111* 91  91  BUN 34* 36* 39*  CREATININE 1.34* 1.38* 1.47*  CALCIUM 8.4* 8.1* 8.2*  PHOS  --   --  5.9*   Liver Function Tests: Recent Labs  Lab 01/04/21 1645 01/06/21 0450 01/10/21 0610  AST 27 28  --   ALT  20 20  --   ALKPHOS 166* 163*  --   BILITOT 1.4* 1.5*  --   PROT 5.6* 5.6*  --   ALBUMIN 2.7* 2.6* 2.8*   No results for input(s): LIPASE, AMYLASE in the last 168 hours. No results for input(s): AMMONIA in the last 168 hours. CBC: Recent Labs  Lab 01/05/21 0500 01/06/21 0450 01/08/21 0320 01/08/21 1723 01/09/21 0128 01/09/21 0544  WBC 4.3 5.0  --   --   --   --   NEUTROABS 2.8  --   --   --   --   --   HGB 7.5* 8.2*   < > 7.3* 7.3* 7.4*  HCT 23.8* 26.1*   < > 22.7* 22.9* 22.6*   MCV 93.7 95.3  --   --   --   --   PLT 159 177  --   --   --   --    < > = values in this interval not displayed.   Cardiac Enzymes: No results for input(s): CKTOTAL, CKMB, CKMBINDEX, TROPONINI in the last 168 hours. CBG: Recent Labs  Lab 01/06/21 1648 01/07/21 0608 01/08/21 0621 01/09/21 0625 01/10/21 0506  GLUCAP 117* 93 98 121* 97    Iron Studies: No results for input(s): IRON, TIBC, TRANSFERRIN, FERRITIN in the last 72 hours. Studies/Results: US RENAL  Result Date: 01/09/2021 CLINICAL DATA:  Acute kidney injury. EXAM: RENAL / URINARY TRACT ULTRASOUND COMPLETE COMPARISON:  01/05/2021 FINDINGS: Right Kidney: Renal measurements: 11.3 x 5.8 x 5.6 cm = volume: 191 mL. Moderate hydronephrosis of the right kidney similar to prior study. Normal parenchymal thickness and echotexture. No focal mass lesions. Left Kidney: Renal measurements: 9.1 x 4.4 x 4.1 cm = volume: 87 mL. Echogenicity within normal limits. No mass or hydronephrosis visualized. Bladder: Appears normal for degree of bladder distention. Other: Small bilateral pleural effusions are incidentally demonstrated. IMPRESSION: 1. Moderate right hydronephrosis similar to prior study. 2. Small bilateral pleural effusions. Electronically Signed   By: Burman Nieves M.D.   On: 01/09/2021 19:41   VAS [place] LOWER EXTREMITY VENOUS (DVT)  Result Date: 01/09/2021  Lower Venous DVT Study Patient Name:  Ashley Camacho  Date of Exam:   01/08/2021 Medical Rec #: [id number]        Accession #:    [id number] Date of Birth: May 19, 1991       Patient Gender: F Patient Age:   21 years Exam Location:  [facility] Procedure:      VAS [place] LOWER EXTREMITY VENOUS (DVT) Referring Phys: Milas Hock --------------------------------------------------------------------------------  Indications: Edema, and [redacted] weeks pregnant.  Risk Factors: Hyperemesis gravidarum. Limitations: Poor ultrasound/tissue interface and interstitial fluid. Comparison Study: No prior  study Performing Technologist: Gertie Fey MHA, RDMS, RVT, RDCS  Examination Guidelines: A complete evaluation includes B-mode imaging, spectral Doppler, color Doppler, and power Doppler as needed of all accessible portions of each vessel. Bilateral testing is considered an integral part of a complete examination. Limited examinations for reoccurring indications may be performed as noted. The reflux portion of the exam is performed with the patient in reverse Trendelenburg.  +---------+---------------+---------+-----------+----------+--------------+ RIGHT    CompressibilityPhasicitySpontaneityPropertiesThrombus Aging +---------+---------------+---------+-----------+----------+--------------+ CFV      Full           Yes      Yes                                 +---------+---------------+---------+-----------+----------+--------------+  SFJ      Full                                                        +---------+---------------+---------+-----------+----------+--------------+ FV Prox  Full                                                        +---------+---------------+---------+-----------+----------+--------------+ FV Mid   Full                                                        +---------+---------------+---------+-----------+----------+--------------+ FV DistalFull                                                        +---------+---------------+---------+-----------+----------+--------------+ PFV      Full                                                        +---------+---------------+---------+-----------+----------+--------------+ POP      Full           Yes      Yes                                 +---------+---------------+---------+-----------+----------+--------------+ PTV      Full                                                        +---------+---------------+---------+-----------+----------+--------------+ PERO     Full                                                         +---------+---------------+---------+-----------+----------+--------------+   +----+---------------+---------+-----------+----------+--------------+ LEFTCompressibilityPhasicitySpontaneityPropertiesThrombus Aging +----+---------------+---------+-----------+----------+--------------+ CFV Full           Yes      Yes                                 +----+---------------+---------+-----------+----------+--------------+     Summary: RIGHT: - There is no evidence of deep vein thrombosis in the lower extremity.  - No cystic structure found in the popliteal fossa.  LEFT: - No evidence of common femoral vein obstruction.  *See table(s) above for measurements and observations. Electronically signed by Waverly Ferrari MD on  01/09/2021 at 2:38:53 PM.    Final     Medications: Infusions:  sodium chloride 250 mL (01/08/21 2247)   feeding supplement (OSMOLITE 1.5 CAL) 1,000 mL (01/09/21 2147)   insulin 0.9 Units/hr (01/10/21 1129)   lactated ringers 10 mL/hr at 01/10/21 0923   promethazine (PHENERGAN)25mg  in 1mL NS 25 mg (01/08/21 2252)    Scheduled Medications:  amoxicillin  500 mg Per Tube Q8H   Chlorhexidine Gluconate Cloth  6 each Topical Daily   doxylamine (Sleep)  12.5 mg Oral QHS   enoxaparin  70 mg Subcutaneous Q12H   feeding supplement (PROSource TF)  45 mL Per Tube BID   ferrous sulfate  300 mg Per Tube QODAY   folic acid  1 mg Per Tube Daily   gabapentin  100 mg Oral Q8H   hydrALAZINE  25 mg Per Tube Q8H   mouth rinse  15 mL Mouth Rinse q12n4p   metoCLOPramide (REGLAN) injection  5 mg Intravenous Q6H   pantoprazole (PROTONIX) IV  40 mg Intravenous Q12H   prenatal vitamin w/FE, FA  1 tablet Oral Q1200   scopolamine  1 patch Transdermal Q72H   sodium chloride flush  10-40 mL Intracatheter Q12H   sodium chloride flush  3 mL Intravenous Q12H   thiamine  100 mg Per Tube Daily   vitamin B-6  12.5 mg Oral QHS    have  reviewed scheduled and prn medications.  Physical Exam: General: Lying on bed comfortable, not in distress Heart:RRR, s1s2 nl Lungs:clear b/l, no crackle Abdomen:soft, distended with pregnancy Extremities: Bilateral lower extremity pitting edema present Neurology: Alert awake.  Henry Demeritt Prasad Zamariah Seaborn 01/10/2021,11:53 AM  LOS: 25 days

## 2021-01-11 DIAGNOSIS — E109 Type 1 diabetes mellitus without complications: Secondary | ICD-10-CM

## 2021-01-11 LAB — C3 COMPLEMENT: C3 Complement: 177 mg/dL — ABNORMAL HIGH (ref 82–167)

## 2021-01-11 LAB — PROTEIN, URINE, 24 HOUR
Collection Interval-UPROT: 24 hours
Protein, 24H Urine: 360 mg/d — ABNORMAL HIGH (ref 50–100)
Protein, Urine: 20 mg/dL
Urine Total Volume-UPROT: 1800 mL

## 2021-01-11 LAB — RENAL FUNCTION PANEL
Albumin: 2.4 g/dL — ABNORMAL LOW (ref 3.5–5.0)
Anion gap: 7 (ref 5–15)
BUN: 39 mg/dL — ABNORMAL HIGH (ref 6–20)
CO2: 21 mmol/L — ABNORMAL LOW (ref 22–32)
Calcium: 8.2 mg/dL — ABNORMAL LOW (ref 8.9–10.3)
Chloride: 111 mmol/L (ref 98–111)
Creatinine, Ser: 1.39 mg/dL — ABNORMAL HIGH (ref 0.44–1.00)
GFR, Estimated: 53 mL/min — ABNORMAL LOW (ref >60)
Glucose, Bld: 101 mg/dL — ABNORMAL HIGH (ref 70–99)
Phosphorus: 5.6 mg/dL — ABNORMAL HIGH (ref 2.5–4.6)
Potassium: 5 mmol/L (ref 3.5–5.1)
Sodium: 139 mmol/L (ref 135–145)

## 2021-01-11 LAB — TYPE AND SCREEN
ABO/RH(D): A POS
Antibody Screen: NEGATIVE
Unit division: 0
Unit division: 0

## 2021-01-11 LAB — BPAM RBC
Blood Product Expiration Date: 202209132359
Blood Product Expiration Date: 202209132359
ISSUE DATE / TIME: 202208201511
ISSUE DATE / TIME: 202208201820
Unit Type and Rh: 6200
Unit Type and Rh: 6200

## 2021-01-11 LAB — CBC
HCT: 27.7 % — ABNORMAL LOW (ref 36.0–46.0)
Hemoglobin: 9.1 g/dL — ABNORMAL LOW (ref 12.0–15.0)
MCH: 30.4 pg (ref 26.0–34.0)
MCHC: 32.9 g/dL (ref 30.0–36.0)
MCV: 92.6 fL (ref 80.0–100.0)
Platelets: 186 10*3/uL (ref 150–400)
RBC: 2.99 MIL/uL — ABNORMAL LOW (ref 3.87–5.11)
RDW: 16 % — ABNORMAL HIGH (ref 11.5–15.5)
WBC: 5.1 10*3/uL (ref 4.0–10.5)
nRBC: 0 % (ref 0.0–0.2)

## 2021-01-11 LAB — HEPARIN ANTI-XA
Heparin LMW: 1.37 IU/mL
Heparin LMW: 0.83 IU/mL

## 2021-01-11 LAB — C4 COMPLEMENT: Complement C4, Body Fluid: 42 mg/dL — ABNORMAL HIGH (ref 12–38)

## 2021-01-11 LAB — HEMOGLOBIN AND HEMATOCRIT, BLOOD
HCT: 28.3 % — ABNORMAL LOW (ref 36.0–46.0)
Hemoglobin: 9.2 g/dL — ABNORMAL LOW (ref 12.0–15.0)

## 2021-01-11 LAB — GLUCOSE, CAPILLARY: Glucose-Capillary: 83 mg/dL (ref 70–99)

## 2021-01-11 MED ORDER — ENOXAPARIN SODIUM 60 MG/0.6ML IJ SOSY
55.0000 mg | PREFILLED_SYRINGE | Freq: Two times a day (BID) | INTRAMUSCULAR | Status: DC
  Administered 2021-01-11 – 2021-01-13 (×4): 55 mg via SUBCUTANEOUS
  Filled 2021-01-11 (×5): qty 0.6

## 2021-01-11 MED ORDER — DULOXETINE HCL 20 MG PO CPEP
20.0000 mg | ORAL_CAPSULE | Freq: Every day | ORAL | Status: DC
  Administered 2021-01-11 – 2021-01-12 (×2): 20 mg via ORAL
  Filled 2021-01-11 (×2): qty 1

## 2021-01-11 MED ORDER — FUROSEMIDE 10 MG/ML IJ SOLN
20.0000 mg | Freq: Once | INTRAMUSCULAR | Status: AC
  Administered 2021-01-11: 20 mg via INTRAVENOUS
  Filled 2021-01-11: qty 2

## 2021-01-11 NOTE — Progress Notes (Signed)
[facility] PROGRESS [facility]  Assessment/ Plan: Pt is a 30 y.o. yo female  with 28 weeks of gestation G2 P1, type 1 diabetes diagnosed since age of 1, known chronic proteinuria, hypertension, DVT on anticoagulation who was admitted on 7/26 for hyperemesis gravidarum and severe malnutrition, seen as a consultation at the for the evaluation of proteinuria and AKI.  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  Patient reported that she has known proteinuria since young age and was seen by nephrologist before thought to be due to Diabetes  Blood pressure acceptable. Repeat UA with UTI, already on abx.  Kidney ultrasound with a moderate right hydronephrosis which is probably physiologic in pregnancy.  Left kidney unremarkable. Checking 24-hour urine collection for proteinuria and serologies including ANA, antidsDNA, complement are pending. Hepatitis panel is negative.  Avoid ACE inhibitor or ARB.   #Acute kidney injury probably hemodynamically mediated in the setting of intractable nausea vomiting and hypoalbuminemia, anemia concomitant with recent hospitalization with sepsis/infection. No evidence of HELLP syndrome. It seems like the creatinine level ranging between 1.2-1.4 for last few weeks.   Foley catheter was inserted for acute urinary retention.  She is nonoliguric with around 1.3 L of urine in 24 hours.  I will order a dose of IV Lasix to manage worsening edema.  Continue with strict ins and out, daily lab and avoid NSAIDs.  #Intractable nausea vomiting and severe protein calorie malnutrition: Probably also has underlying gastroparesis due to diabetes.  Continue supportive care.  She is currently on nasal feeding tube.   #Lower extremity edema: Recent echo with EF of 50 to 55%.  Recommend high-protein diet from the tube feed.  I will order a dose of Lasix today.   #Type 1 diabetes with  chronic proteinuria: Currently on insulin drip.   #Pregnancy: Followed by OB team.  Continue vitamin and oral supplement per them.   #Anemia: Received 2 units of blood transfusion on 8/20.  Reportedly patient has some epistaxis and also on anticoagulation.  This is managing by medicine team.     #Urinary tract infection on amoxicillin for 7 days.  Discussed with bedside nurse in detail.  Subjective: Seen and examined at bedside.  Event noted.  Received unit of blood transfusion.  A Foley catheter was inserted for urinary retention.  Patient feels fatigue tired.  Urine output is around 1.3 L. Objective Vital signs in last 24 hours: Vitals:   01/11/21 0404 01/11/21 0405 01/11/21 0622 01/11/21 0821  BP: (!) 130/59   (!) 119/52  Pulse: 85   88  Resp: 18   18  Temp: 97.7 F (36.5 C)   97.7 F (36.5 C)  TempSrc: Oral   Oral  SpO2: 96% 96%  96%  Weight:   75.7 kg   Height:       Weight change: 3.538 kg  Intake/Output Summary (Last 24 hours) at 01/11/2021 1127 Last data filed at 01/11/2021 0900 Gross per 24 hour  Intake 3185.04 ml  Output 1450 ml  Net 1735.04 ml        Labs: Basic Metabolic Panel: Recent Labs  Lab 01/09/21 0544 01/10/21 0610 01/11/21 0759  NA 139 135 139  K 4.9 5.0 5.0  CL 111 109 111  CO2 21* 20* 21*  GLUCOSE 111* 91  91 101*  BUN 36* 39* 39*  CREATININE 1.38* 1.47* 1.39*  CALCIUM 8.1* 8.2* 8.2*  PHOS  --  5.9* 5.6*    Liver Function Tests: Recent Labs  Lab 01/04/21 1645 01/06/21 0450 01/10/21 0610 01/11/21 0759  AST 27 28  --   --   ALT 20 20  --   --   ALKPHOS 166* 163*  --   --   BILITOT 1.4* 1.5*  --   --   PROT 5.6* 5.6*  --   --   ALBUMIN 2.7* 2.6* 2.8* 2.4*    No results for input(s): LIPASE, AMYLASE in the last 168 hours. No results for input(s): AMMONIA in the last 168 hours. CBC: Recent Labs  Lab 01/05/21 0500 01/06/21 0450 01/08/21 0320 01/10/21 1310 01/11/21 0218 01/11/21 0524  WBC 4.3 5.0  --  4.2  --  5.1   NEUTROABS 2.8  --   --  2.9  --   --   HGB 7.5* 8.2*   < > 6.9* 9.2* 9.1*  HCT 23.8* 26.1*   < > 21.9* 28.3* 27.7*  MCV 93.7 95.3  --  95.2  --  92.6  PLT 159 177  --  185  --  186   < > = values in this interval not displayed.    Cardiac Enzymes: No results for input(s): CKTOTAL, CKMB, CKMBINDEX, TROPONINI in the last 168 hours. CBG: Recent Labs  Lab 01/07/21 0608 01/08/21 0621 01/09/21 0625 01/10/21 0506 01/11/21 0621  GLUCAP 93 98 121* 97 83     Iron Studies: No results for input(s): IRON, TIBC, TRANSFERRIN, FERRITIN in the last 72 hours. Studies/Results: US RENAL  Result Date: 01/09/2021 CLINICAL DATA:  Acute kidney injury. EXAM: RENAL / URINARY TRACT ULTRASOUND COMPLETE COMPARISON:  01/05/2021 FINDINGS: Right Kidney: Renal measurements: 11.3 x 5.8 x 5.6 cm = volume: 191 mL. Moderate hydronephrosis of the right kidney similar to prior study. Normal parenchymal thickness and echotexture. No focal mass lesions. Left Kidney: Renal measurements: 9.1 x 4.4 x 4.1 cm = volume: 87 mL. Echogenicity within normal limits. No mass or hydronephrosis visualized. Bladder: Appears normal for degree of bladder distention. Other: Small bilateral pleural effusions are incidentally demonstrated. IMPRESSION: 1. Moderate right hydronephrosis similar to prior study. 2. Small bilateral pleural effusions. Electronically Signed   By: Burman Nieves M.D.   On: 01/09/2021 19:41    Medications: Infusions:  sodium chloride 250 mL (01/08/21 2247)   feeding supplement (OSMOLITE 1.5 CAL) 1,000 mL (01/10/21 1803)   insulin 1.1 Units/hr (01/11/21 0942)   lactated ringers 10 mL/hr at 01/10/21 0923   promethazine (PHENERGAN)25mg  in 10mL NS 25 mg (01/08/21 2252)    Scheduled Medications:  sodium chloride   Intravenous Once   amoxicillin  500 mg Per Tube Q8H   Chlorhexidine Gluconate Cloth  6 each Topical Daily   doxylamine (Sleep)  12.5 mg Oral QHS   enoxaparin  55 mg Subcutaneous Q12H   feeding  supplement (PROSource TF)  45 mL Per Tube BID   ferrous sulfate  300 mg Per Tube QODAY   folic acid  1 mg Per Tube Daily   gabapentin  100 mg Oral Q8H   hydrALAZINE  25 mg Per Tube Q8H   mouth rinse  15 mL Mouth Rinse q12n4p   metoCLOPramide (REGLAN) injection  5 mg Intravenous Q6H   pantoprazole (PROTONIX) IV  40 mg Intravenous Q12H   prenatal vitamin w/FE, FA  1 tablet Oral Q1200   scopolamine  1 patch Transdermal Q72H   sodium chloride flush  10-40 mL Intracatheter Q12H   sodium chloride flush  3  mL Intravenous Q12H   thiamine  100 mg Per Tube Daily   vitamin B-6  12.5 mg Oral QHS    have reviewed scheduled and prn medications.  Physical Exam: General: Lying on bed, not in distress Heart:RRR, s1s2 nl Lungs: Distant breath sound, no wheeze or crackle appreciated Abdomen:soft, nontender, distended with pregnancy Extremities: Bilateral lower extremity pitting edema + Neurology: Alert awake.  Mercy Leppla Prasad Giordano Getman 01/11/2021,11:27 AM  LOS: 26 days

## 2021-01-11 NOTE — Progress Notes (Signed)
Patient ID: Ashley Camacho, female   DOB: February 18, 1991, 30 y.o.   MRN: [id number] [facility] COM[facility]HENSIVE PROGRESS NOTE  Ashley Camacho is a 30 y.o. G2P0101 at [redacted]w[redacted]d  who is admitted for complications of Type DM 1 in setting of IUP. Marland Kitchen Fetal presentation is cephalic. Length of Stay:  26  Days  Subjective: Pt is resting in bed. Still with some leg pain. Episode of emesis earlier today.  Patient reports good fetal movement.  She reports no uterine contractions, no bleeding and no loss of fluid per vagina.  Vitals:  Blood pressure 128/60, pulse 85, temperature 98.1 F (36.7 C), temperature source Oral, resp. rate 16, height 5\' 1"  (1.549 m), weight 75.7 kg, SpO2 98 %. Physical Examination:  Lungs clear Heart RRR Abd soft + BS gravid Ext SCD's Fetal Monitoring:  Baseline: 120's bpm, + accels, no decles, occ rare ut ctx  Labs:  Results for orders placed or performed during the hospital encounter of 12/16/20 (from the past 24 hour(s))  Prepare RBC (crossmatch)   Collection Time: 01/10/21  2:32 PM  Result Value Ref Range   Order Confirmation      ORDER PROCESSED BY BLOOD BANK Performed at [facility], [address], [place], [place]   Protime-INR   Collection Time: 01/10/21  4:48 PM  Result Value Ref Range   Prothrombin Time 12.3 11.4 - 15.2 seconds   INR 0.9 0.8 - 1.2  APTT   Collection Time: 01/10/21  4:48 PM  Result Value Ref Range   aPTT 52 (H) 24 - 36 seconds  Fibrinogen   Collection Time: 01/10/21  4:48 PM  Result Value Ref Range   Fibrinogen 699 (H) 210 - 475 mg/dL  Low molecular wgt heparin (fractionated)   Collection Time: 01/11/21  2:18 AM  Result Value Ref Range   Heparin LMW 1.37 IU/mL  Hemoglobin and hematocrit, blood   Collection Time: 01/11/21  2:18 AM  Result Value Ref Range   Hemoglobin 9.2 (L) 12.0 - 15.0 g/dL   HCT 01/13/21 (L) 62.1 - 30.8 %  CBC   Collection Time: 01/11/21  5:24 AM  Result Value Ref Range   WBC 5.1 4.0 -  10.5 K/uL   RBC 2.99 (L) 3.87 - 5.11 MIL/uL   Hemoglobin 9.1 (L) 12.0 - 15.0 g/dL   HCT 01/13/21 (L) 84.6 - 96.2 %   MCV 92.6 80.0 - 100.0 fL   MCH 30.4 26.0 - 34.0 pg   MCHC 32.9 30.0 - 36.0 g/dL   RDW 95.2 (H) 84.1 - 32.4 %   Platelets 186 150 - 400 K/uL   nRBC 0.0 0.0 - 0.2 %  Glucose, capillary   Collection Time: 01/11/21  6:21 AM  Result Value Ref Range   Glucose-Capillary 83 70 - 99 mg/dL  Renal function panel   Collection Time: 01/11/21  7:59 AM  Result Value Ref Range   Sodium 139 135 - 145 mmol/L   Potassium 5.0 3.5 - 5.1 mmol/L   Chloride 111 98 - 111 mmol/L   CO2 21 (L) 22 - 32 mmol/L   Glucose, Bld 101 (H) 70 - 99 mg/dL   BUN 39 (H) 6 - 20 mg/dL   Creatinine, Ser 01/13/21 (H) 0.44 - 1.00 mg/dL   Calcium 8.2 (L) 8.9 - 10.3 mg/dL   Phosphorus 5.6 (H) 2.5 - 4.6 mg/dL   Albumin 2.4 (L) 3.5 - 5.0 g/dL   GFR, Estimated 53 (L) >60 mL/min  Anion gap 7 5 - 15  Low molecular wgt heparin (fractionated)   Collection Time: 01/11/21  9:15 AM  Result Value Ref Range   Heparin LMW 0.83 IU/mL    Imaging Studies:    NA   Medications:  Scheduled  sodium chloride   Intravenous Once   amoxicillin  500 mg Per Tube Q8H   Chlorhexidine Gluconate Cloth  6 each Topical Daily   doxylamine (Sleep)  12.5 mg Oral QHS   enoxaparin  55 mg Subcutaneous Q12H   feeding supplement (PROSource TF)  45 mL Per Tube BID   ferrous sulfate  300 mg Per Tube QODAY   folic acid  1 mg Per Tube Daily   gabapentin  100 mg Oral Q8H   hydrALAZINE  25 mg Per Tube Q8H   mouth rinse  15 mL Mouth Rinse q12n4p   metoCLOPramide (REGLAN) injection  5 mg Intravenous Q6H   pantoprazole (PROTONIX) IV  40 mg Intravenous Q12H   prenatal vitamin w/FE, FA  1 tablet Oral Q1200   scopolamine  1 patch Transdermal Q72H   sodium chloride flush  10-40 mL Intracatheter Q12H   sodium chloride flush  3 mL Intravenous Q12H   thiamine  100 mg Per Tube Daily   vitamin B-6  12.5 mg Oral QHS   I have reviewed the patient's current  medications.  ASSESSMENT: IUP 28 6/7 weeks Type 1 DM Gastroparesis Anemia Chronic CHF   PLAN: Stable from an OB standpoint. Medical problems as per medical team. Continue routine antenatal care.   Hermina Staggers 01/11/2021,1:21 PM

## 2021-01-11 NOTE — Progress Notes (Signed)
PROGRESS NOTE    Ashley Camacho  INO:[id number] DOB: 12/03/1990 DOA: 12/16/2020 PCP: Hildred Laser, MD   Chief Complain:Nausea,vomiting  Brief Narrative:  Patient is a 30 year old female G2,P1 at [redacted] weeks gestation who has a history of type 1 diabetes mellitus.  She presented to the emergency room for evaluation of persistent nausea and vomiting .  She was recently with multiple hospitalization, at [facility] for sepsis secondary to pneumonia and was there for 2 weeks.  Couple hospitalization at [facility] due to intractable nausea and vomiting. -Transferred from [facility] to [facility] 7/26 given her severe malnutrition, with hyperemesis gravidarum, for consideration of cortrak tube insertion.  Patient had core track inserted on admission, so far tolerating tube feed (she remains with significant nausea and vomiting, but this is at baseline and did not worsen with initiation of tube feed), as well her work-up was significant for acute CHF, requiring diuresis, cardiology were following.Continues to have intermittent  nausea and vomiting . Goal is to diet advancement and removal of feeding tube.Currently attemtping on clear liquid diet.She might end up staying here until delivery at 34 weeks.  01/09/2021: Patient seen alongside patient's nurse.  Available records reviewed.  Worsening proteinuria is noted.  Last UPC done on 12/20/2020 was suggestive of 1.43 proteinuria (up from 1.03 g proteinuria on 11/21/2020).  Blood pressure of 155/98 mmHg documented around 3 PM on 01/08/2021.  Current blood pressure is 121/54 mmHg.  Patient has significant bilateral lower extremity edema and serum albumin of 2.6.  Nephrology team has been consulted.  Discussed personally with Dr. Ronalee Belts.  Patient has had intermittent, chronic thrombocytopenia without any significant drop in the last few weeks or months.  Serum creatinine was 0.81 on 10/30/2018 and has gradually risen to 1.38 today.  Patient has been type I diabetic  since age of 59.  Blood sugars controlled today.  No reported seizure activity.  No further nausea or vomiting reported.  Patient is on Osmolite 1.5 via tube feeds.  Patient reports right lower leg pain, suspect neuropathic pain.  Doppler ultrasound of the lower extremities were reported as negative.  01/10/2021: Patient seen.  Nephrology input is highly appreciated.  Discussed extensively with the Nephrologist, Dron Jaynie Collins, MD.  Worsening renal function is noted.  Rising phosphorus level is noted.  CO2 is 20.  Potassium is 5.  Dr. Jaynie Collins is kind enough to communicate that he will follow the patient.  Renal ultrasound is noted (moderate right-sided hydronephrosis).  24-hour urine analysis results (protein) is pending.  Other work-up is pending.  Systolic blood pressure today is 135 mmHg.  Patient is nonoliguric.  Serum creatinine today is 1.47.  Patient denies shortness of breath.  Intermittent nosebleed is noted.  Urinalysis revealed WBC and RBC.  Urine sodium was 32.  Patient received albumin yesterday.  01/11/2021: Patient seen.  Patient looks better today.  Patient was transfused with 1 unit unit of packed red blood cells yesterday.  Urine output has improved today after administration of 1 dose of IV Lasix 20 Mg by nephrology team.  Patient had 1 episode of nausea and vomiting.  Blood sugar has been reasonably controlled.  Last hemoglobin was 9.1 g/dL.  Mild improvement in serum creatinine (1.39).  Assessment & Plan:   Active Problems:   Delivery with history of C-section   Anemia affecting pregnancy in second trimester   Acute kidney injury (HCC)   Hyperemesis   Pregnancy with 24 completed weeks gestation   Diabetes type 1,  uncontrolled (HCC)   Starvation ketoacidosis   Acute deep vein thrombosis (DVT) of brachial vein of right upper extremity (HCC)   Acute respiratory failure with hypoxia (HCC)   Vitreous hemorrhage of right eye (HCC)   Gastroparesis due to DM (HCC)    Diabetic retinopathy (HCC)   Acute on chronic combined systolic and diastolic CHF (congestive heart failure) (HCC)   Type 1 diabetes mellitus affecting pregnancy in second trimester, antepartum   HFrEF (heart failure with reduced ejection fraction) (HCC)   [redacted] weeks gestation of pregnancy   Acute respiratory distress   Anemia during pregnancy   Pre-existing type 1 diabetes mellitus during pregnancy in second trimester   Pregnancy with 28 completed weeks gestation   History of preterm delivery, currently pregnant   Leg edema   Nasogastric tube present   Intractable nausea/vomiting, severe protein calorie malnutrition, hyperemesis gravidarum/underlying gastroparesis: Presented with persistent nausea and vomiting.  Was on TPN previously which was discontinued on 7/28.  Now being fed through tube.  Tolerating tube feeding.    Was previously started on a steroid for hyperemesis gravidarum but it was discontinued because it did not help.  Currently on Reglan, Zofran, scopolamine, Protonix, Compazine, Phenergan.Also on pyridoxine. On clear liquid diet .  Diet was advanced to full liquid yesterday but she had vomiting so diet changed to clear liquid again Goal is to remove the tube feeding after she adequately tolerates solid food,might take long time 01/09/2021: No further nausea or vomiting reported.  See above documentation. 01/10/2021: Mild nausea reported, without significant vomiting. 01/11/2021: Patient had 1 episode of nausea and vomiting today.  Overall, patient seems to be improving.  Type 1 diabetes: Uncontrolled with hyperglycemia alternating with hyperglycemia.  On insulin drip.  Follows with endocrinologist and takes insulin at home.Diabetic coordinator were following,. 01/09/2021: See above documentation. 01/11/2021: Continue to optimize.  Blood sugars well controlled.    Cardiomyopathy/acute systolic congestive heart failure: Currently on hydralazine Has severe bilateral lower extremity  edema.  Echo done on 12/28/2020 showed ejection fraction of 50 to 55%, small pericardial effusion.  Echo done on 7/22 had shown ejection fraction of 40 to 45%.  Cardiology were following,now signed off.  Lasix on hold due to AKI. Bilateral lower extremity edema also could have been contributed due to pregnancy, hypoalbuminemia. 01/09/2021: Last echocardiogram revealed, no significant findings reported.  Right upper extremity DVT: On Lovenox  AKI on CKD stage II: Hospital course remarkable for AKI, diuresis on hold.  Continue to monitor.Creatinine  has plateaued around 1.2-1.3 01/09/2021: Technically, this remains acute kidney injury.  AKI slightly worsening.  Proteinuria is worsening.  Chronic thrombocytopenia, dull, intermittent.  Albumin of 2.6.  Nephrology team has been consulted.  Meanwhile, we will proceed with 24-hour urine collection to quantify protein, check urine sodium, obtain renal ultrasound.  We will defer other management to the nephrology team. 01/10/2021: Worsening acute kidney injury.  Nephrology team is directing care. 01/11/2021: Slight improvement in serum creatinine noted.  Improved urine output with IV Lasix.  Cautious diuretics.  Normocytic anemia: Most likely  dilutional secondary to pregnancy hypervolemia/hypoalbuminemia.  Continue to monitor 01/10/2021: Repeat hemoglobin today 6.9 g/dL.  Will defer decision to transfuse packed red blood cells to the primary care provider.  Dark urine: Renal ultrasound did not show any hydronephrosis, no renal stones.  Urine culture showed Enterococcus faecalis, currently on amoxicillin.  Coffee-ground emesis: As per RN, noticed on the night of 8/18.  She is not actively bleeding.  No further episodes of  coffee-ground emesis observed.  We will continue to monitor H&H.  Her hemoglobin this morning is stable.  Continue Protonix 40 mg IV twice daily for now 01/10/2021: Resolved.  Hemoglobin of 6.9 g/dL noted.  Suspect acute blood loss  anemia.  Second trimester pregnancy: 28 weeks.  OB-GYN following.  Maternal-fetal medicine recommended daily fetal testing, monthly growth ultrasounds, delivery at 34 weeks, no antenatal corticosteroids.  Right lower extremity pain: -Worrisome for diabetic neuropathy. -Shortness of management to be challenging, considering the patient is pregnant. -At the best of times, this could take time to improve. -Continue to optimize diabetes mellitus management. -We will discuss safety of available management options with the primary team (OB/GYN) 01/11/2021: No new complaints today.  Nutrition Problem: Inadequate oral intake Etiology: nausea, vomiting   DVT prophylaxis:Lovenox Code Status: Full Family Communication: Boyfriend at the bedside on 8.8.22 Status is: Inpatient  Remains inpatient appropriate because:Inpatient level of care appropriate due to severity of illness  Dispo: The patient is from: Home              Anticipated d/c is to: Home              Patient currently is not medically stable to d/c.   Difficult to place patient No  Unable to discharge the patient until she is able to tolerate solid oral food/removal of feeding tube   Consultants: Cardiology, GYN  Procedures: None  Antimicrobials:  Anti-infectives (From admission, onward)    Start     Dose/Rate Route Frequency Ordered Stop   01/06/21 1630  amoxicillin (AMOXIL) 250 MG/5ML suspension 500 mg        500 mg Per Tube Every 8 hours 01/06/21 1534 01/13/21 1359   01/06/21 1615  amoxicillin (AMOXIL) chewable tablet 500 mg  Status:  Discontinued        500 mg Oral Every 8 hours 01/06/21 1528 01/06/21 1532       Subjective: -Patient looks better today..   -1 episode of nausea vomiting reported.      Objective: Vitals:   01/11/21 0622 01/11/21 0821 01/11/21 1155 01/11/21 1533  BP:  (!) 119/52 128/60 (!) 127/58  Pulse:  88 85 90  Resp:  18 16 18   Temp:  97.7 F (36.5 C) 98.1 F (36.7 C) 98.3 F (36.8 C)   TempSrc:  Oral Oral Axillary  SpO2:  96% 98% 97%  Weight: 75.7 kg     Height:         Intake/Output Summary (Last 24 hours) at 01/11/2021 1651 Last data filed at 01/11/2021 1600 Gross per 24 hour  Intake 3203.94 ml  Output 2000 ml  Net 1203.94 ml    Filed Weights   01/09/21 0632 01/10/21 0457 01/11/21 0622  Weight: 69.1 kg 72.2 kg 75.7 kg    Examination: General exam: Awake and alert.  Patient is not in any distress.  Edematous. HEENT: PERRL, feeding tube Respiratory system:  no wheezes or crackles  Cardiovascular system: S1 & S2 heard, RRR.  Gastrointestinal system: Abdomen is nondistended, soft and nontender. Central nervous system: Alert and oriented Extremities: 2+ to 3 bilateral lower extremity pitting  edema  Data Reviewed: I have personally reviewed following labs and imaging studies  CBC: Recent Labs  Lab 01/05/21 0500 01/06/21 0450 01/08/21 0320 01/09/21 0128 01/09/21 0544 01/10/21 1310 01/11/21 0218 01/11/21 0524  WBC 4.3 5.0  --   --   --  4.2  --  5.1  NEUTROABS 2.8  --   --   --   --  2.9  --   --   HGB 7.5* 8.2*   < > 7.3* 7.4* 6.9* 9.2* 9.1*  HCT 23.8* 26.1*   < > 22.9* 22.6* 21.9* 28.3* 27.7*  MCV 93.7 95.3  --   --   --  95.2  --  92.6  PLT 159 177  --   --   --  185  --  186   < > = values in this interval not displayed.    Basic Metabolic Panel: Recent Labs  Lab 01/06/21 0450 01/08/21 0320 2021-01-27 0544 01/10/21 0610 01/11/21 0759  NA 142 141 139 135 139  K 4.8 5.0 4.9 5.0 5.0  CL 112* 111 111 109 111  CO2 23 22 21* 20* 21*  GLUCOSE 101* 97 111* 91  91 101*  BUN 34* 34* 36* 39* 39*  CREATININE 1.36* 1.34* 1.38* 1.47* 1.39*  CALCIUM 8.6* 8.4* 8.1* 8.2* 8.2*  PHOS  --   --   --  5.9* 5.6*    GFR: Estimated Creatinine Clearance: 55.6 mL/min (A) (by C-G formula based on SCr of 1.39 mg/dL (H)). Liver Function Tests: Recent Labs  Lab 01/06/21 0450 01/10/21 0610 01/11/21 0759  AST 28  --   --   ALT 20  --   --   ALKPHOS 163*   --   --   BILITOT 1.5*  --   --   PROT 5.6*  --   --   ALBUMIN 2.6* 2.8* 2.4*    No results for input(s): LIPASE, AMYLASE in the last 168 hours. No results for input(s): AMMONIA in the last 168 hours. Coagulation Profile: Recent Labs  Lab 01/27/21 1642 01/10/21 1648  INR 1.0 0.9    Cardiac Enzymes: No results for input(s): CKTOTAL, CKMB, CKMBINDEX, TROPONINI in the last 168 hours. BNP (last 3 results) No results for input(s): PROBNP in the last 8760 hours. HbA1C: No results for input(s): HGBA1C in the last 72 hours. CBG: Recent Labs  Lab 01/07/21 0608 01/08/21 0621 2021/01/27 0625 01/10/21 0506 01/11/21 0621  GLUCAP 93 98 121* 97 83    Lipid Profile: No results for input(s): CHOL, HDL, LDLCALC, TRIG, CHOLHDL, LDLDIRECT in the last 72 hours. Thyroid Function Tests: No results for input(s): TSH, T4TOTAL, FREET4, T3FREE, THYROIDAB in the last 72 hours. Anemia Panel: No results for input(s): VITAMINB12, FOLATE, FERRITIN, TIBC, IRON, RETICCTPCT in the last 72 hours. Sepsis Labs: No results for input(s): PROCALCITON, LATICACIDVEN in the last 168 hours.  Recent Results (from the past 240 hour(s))  OB Urine Culture     Status: Abnormal   Collection Time: 01/05/21  2:32 PM   Specimen: Urine, Random  Result Value Ref Range Status   Specimen Description URINE, RANDOM  Final   Special Requests NONE  Final   Culture (A)  Final    >=100,000 COLONIES/mL ENTEROCOCCUS FAECALIS NO GROUP B STREP (S.AGALACTIAE) ISOLATED Performed at [facility], [address], [place], [place] [address]    Report Status 01/07/2021 FINAL  Final   Organism ID, Bacteria ENTEROCOCCUS FAECALIS (A)  Final      Susceptibility   Enterococcus faecalis - MIC*    AMPICILLIN <=2 SENSITIVE Sensitive     NITROFURANTOIN <=16 SENSITIVE Sensitive     VANCOMYCIN 2 SENSITIVE Sensitive     * >=100,000 COLONIES/mL ENTEROCOCCUS FAECALIS          Radiology Studies: US RENAL  Result Date:  27-Jan-2021 CLINICAL DATA:  Acute kidney injury. EXAM: RENAL / URINARY  TRACT ULTRASOUND COMPLETE COMPARISON:  01/05/2021 FINDINGS: Right Kidney: Renal measurements: 11.3 x 5.8 x 5.6 cm = volume: 191 mL. Moderate hydronephrosis of the right kidney similar to prior study. Normal parenchymal thickness and echotexture. No focal mass lesions. Left Kidney: Renal measurements: 9.1 x 4.4 x 4.1 cm = volume: 87 mL. Echogenicity within normal limits. No mass or hydronephrosis visualized. Bladder: Appears normal for degree of bladder distention. Other: Small bilateral pleural effusions are incidentally demonstrated. IMPRESSION: 1. Moderate right hydronephrosis similar to prior study. 2. Small bilateral pleural effusions. Electronically Signed   By: Burman NievesWilliam  Stevens M.D.   On: 01/09/2021 19:41        Scheduled Meds:  sodium chloride   Intravenous Once   amoxicillin  500 mg Per Tube Q8H   Chlorhexidine Gluconate Cloth  6 each Topical Daily   doxylamine (Sleep)  12.5 mg Oral QHS   enoxaparin  55 mg Subcutaneous Q12H   feeding supplement (PROSource TF)  45 mL Per Tube BID   ferrous sulfate  300 mg Per Tube QODAY   folic acid  1 mg Per Tube Daily   gabapentin  100 mg Oral Q8H   hydrALAZINE  25 mg Per Tube Q8H   mouth rinse  15 mL Mouth Rinse q12n4p   metoCLOPramide (REGLAN) injection  5 mg Intravenous Q6H   pantoprazole (PROTONIX) IV  40 mg Intravenous Q12H   prenatal vitamin w/FE, FA  1 tablet Oral Q1200   scopolamine  1 patch Transdermal Q72H   sodium chloride flush  10-40 mL Intracatheter Q12H   sodium chloride flush  3 mL Intravenous Q12H   thiamine  100 mg Per Tube Daily   vitamin B-6  12.5 mg Oral QHS   Continuous Infusions:  sodium chloride 250 mL (01/08/21 2247)   feeding supplement (OSMOLITE 1.5 CAL) 1,000 mL (01/11/21 1507)   insulin 1.5 Units/hr (01/11/21 1638)   lactated ringers 10 mL/hr at 01/10/21 0923   promethazine (PHENERGAN)25mg  in 50mL NS 25 mg (01/08/21 2252)     LOS: 26 days     Time spent: 25 minutes   Berton MountSylvester Zebulon Gantt, MD [facility] P8/21/2022, 4:51 PM

## 2021-01-11 NOTE — Progress Notes (Signed)
ANTICOAGULATION CONSULT NOTE - Follow Up Consult  Pharmacy Consult for Lovenox Indication:  VTE treatment, right brachial DVT  No Known Allergies  Patient Measurements: Height: 5\' 1"  (154.9 cm) Weight: 75.7 kg (166 lb 14.4 oz) IBW/kg (Calculated) : 47.8 Heparin Dosing Weight: 70.2 kg  Vital Signs: Temp: 97.7 F (36.5 C) (08/21 0821) Temp Source: Oral (08/21 0821) BP: 119/52 (08/21 0821) Pulse Rate: 88 (08/21 0821)  Labs: Recent Labs    01/09/21 0544 01/09/21 1642 01/10/21 0610 01/10/21 1310 01/10/21 1648 01/11/21 0218 01/11/21 0524 01/11/21 0759 01/11/21 0915  HGB 7.4*  --   --  6.9*  --  9.2* 9.1*  --   --   HCT 22.6*  --   --  21.9*  --  28.3* 27.7*  --   --   PLT  --   --   --  185  --   --  186  --   --   APTT  --   --   --   --  52*  --   --   --   --   LABPROT  --  13.5  --   --  12.3  --   --   --   --   INR  --  1.0  --   --  0.9  --   --   --   --   HEPRLOWMOCWT  --   --   --   --   --  1.37  --   --  0.83  CREATININE 1.38*  --  1.47*  --   --   --   --  1.39*  --      Estimated Creatinine Clearance: 55.6 mL/min (A) (by C-G formula based on SCr of 1.39 mg/dL (H)).  Assessment: Anti-xa at 4 hrs = 1.37 (goal 0.6-1.0). Per protocol, decrease dose by 20% and check repeat level prior to next dose. Anti-Xa trough drawn prior to when next dose was due was 0.83 (goal < 0.5) - Will hold 10 am dose and restart at reduced dose at 18:00 Goal of Therapy:  Anti-Xa level 0.6-1 units/ml 4hrs after LMWH dose given Monitor platelets by anticoagulation protocol: Yes   Plan:  Lovenox 55mg  SQ q12h Daily CBC and renal function checks. Will need to monitor anti-xa levels closer.  01/13/21 01/11/2021,11:25 AM

## 2021-01-11 NOTE — Progress Notes (Signed)
ANTICOAGULATION CONSULT NOTE - Follow Up Consult  Pharmacy Consult for Lovenox Indication:  VTE treatment, right brachial DVT  No Known Allergies  Patient Measurements: Height: 5\' 1"  (154.9 cm) Weight: 72.2 kg (159 lb 1.6 oz) IBW/kg (Calculated) : 47.8 Heparin Dosing Weight: 70.2 kg  Vital Signs: Temp: 97.7 F (36.5 C) (08/21 0404) Temp Source: Oral (08/21 0404) BP: 130/59 (08/21 0404) Pulse Rate: 85 (08/21 0404)  Labs: Recent Labs    01/09/21 0544 01/09/21 1642 01/10/21 0610 01/10/21 1310 01/10/21 1648 01/11/21 0218  HGB 7.4*  --   --  6.9*  --  9.2*  HCT 22.6*  --   --  21.9*  --  28.3*  PLT  --   --   --  185  --   --   APTT  --   --   --   --  52*  --   LABPROT  --  13.5  --   --  12.3  --   INR  --  1.0  --   --  0.9  --   HEPRLOWMOCWT  --   --   --   --   --  1.37  CREATININE 1.38*  --  1.47*  --   --   --     Estimated Creatinine Clearance: 51.3 mL/min (A) (by C-G formula based on SCr of 1.47 mg/dL (H)).  Assessment: Anti-xa at 4 hrs = 1.37 (goal 0.6-1.0). Per protocol, decrease dose by 20% and check repeat level prior to next dose. Goal of Therapy:  Anti-Xa level 0.6-1 units/ml 4hrs after LMWH dose given Monitor platelets by anticoagulation protocol: Yes   Plan:  Lovenox 55mg  SQ q12h and check anti-xa at 0900 (goal <0.5 units/ml).  Daily CBC and renal function checks. Will need to monitor anti-xa levels closer.  01/13/21 01/11/2021,4:42 AM

## 2021-01-12 ENCOUNTER — Inpatient Hospital Stay (HOSPITAL_BASED_OUTPATIENT_CLINIC_OR_DEPARTMENT_OTHER): Payer: Medicaid Other

## 2021-01-12 ENCOUNTER — Inpatient Hospital Stay (HOSPITAL_COMMUNITY): Payer: Medicaid Other

## 2021-01-12 DIAGNOSIS — O289 Unspecified abnormal findings on antenatal screening of mother: Secondary | ICD-10-CM

## 2021-01-12 DIAGNOSIS — D649 Anemia, unspecified: Secondary | ICD-10-CM

## 2021-01-12 DIAGNOSIS — Z3A29 29 weeks gestation of pregnancy: Secondary | ICD-10-CM

## 2021-01-12 DIAGNOSIS — O210 Mild hyperemesis gravidarum: Secondary | ICD-10-CM | POA: Diagnosis not present

## 2021-01-12 DIAGNOSIS — O0993 Supervision of high risk pregnancy, unspecified, third trimester: Secondary | ICD-10-CM

## 2021-01-12 DIAGNOSIS — O402XX0 Polyhydramnios, second trimester, not applicable or unspecified: Secondary | ICD-10-CM | POA: Diagnosis not present

## 2021-01-12 DIAGNOSIS — O24012 Pre-existing diabetes mellitus, type 1, in pregnancy, second trimester: Secondary | ICD-10-CM | POA: Diagnosis not present

## 2021-01-12 DIAGNOSIS — O99012 Anemia complicating pregnancy, second trimester: Secondary | ICD-10-CM | POA: Diagnosis not present

## 2021-01-12 DIAGNOSIS — E1065 Type 1 diabetes mellitus with hyperglycemia: Secondary | ICD-10-CM

## 2021-01-12 LAB — GLUCOSE, CAPILLARY
Glucose-Capillary: 110 mg/dL — ABNORMAL HIGH (ref 70–99)
Glucose-Capillary: 110 mg/dL — ABNORMAL HIGH (ref 70–99)
Glucose-Capillary: 124 mg/dL — ABNORMAL HIGH (ref 70–99)
Glucose-Capillary: 144 mg/dL — ABNORMAL HIGH (ref 70–99)
Glucose-Capillary: 160 mg/dL — ABNORMAL HIGH (ref 70–99)
Glucose-Capillary: 87 mg/dL (ref 70–99)

## 2021-01-12 LAB — CBC
HCT: 27.2 % — ABNORMAL LOW (ref 36.0–46.0)
Hemoglobin: 8.9 g/dL — ABNORMAL LOW (ref 12.0–15.0)
MCH: 30.9 pg (ref 26.0–34.0)
MCHC: 32.7 g/dL (ref 30.0–36.0)
MCV: 94.4 fL (ref 80.0–100.0)
Platelets: 185 10*3/uL (ref 150–400)
RBC: 2.88 MIL/uL — ABNORMAL LOW (ref 3.87–5.11)
RDW: 15.9 % — ABNORMAL HIGH (ref 11.5–15.5)
WBC: 5 10*3/uL (ref 4.0–10.5)
nRBC: 0 % (ref 0.0–0.2)

## 2021-01-12 LAB — HEPARIN ANTI-XA: Heparin LMW: 0.59 IU/mL

## 2021-01-12 LAB — CREATININE, SERUM
Creatinine, Ser: 1.44 mg/dL — ABNORMAL HIGH (ref 0.44–1.00)
GFR, Estimated: 50 mL/min — ABNORMAL LOW (ref >60)

## 2021-01-12 LAB — ANA W/REFLEX IF POSITIVE: Anti Nuclear Antibody (ANA): NEGATIVE

## 2021-01-12 LAB — ANTI-DNA ANTIBODY, DOUBLE-STRANDED: ds DNA Ab: 2 IU/mL (ref 0–9)

## 2021-01-12 MED ORDER — OXYCODONE HCL 5 MG PO TABS
5.0000 mg | ORAL_TABLET | Freq: Four times a day (QID) | ORAL | Status: DC | PRN
  Administered 2021-01-12 – 2021-01-15 (×3): 5 mg via ORAL
  Filled 2021-01-12 (×4): qty 1

## 2021-01-12 MED ORDER — LACTATED RINGERS IV SOLN: INTRAVENOUS | Status: DC

## 2021-01-12 MED ORDER — MAGNESIUM SULFATE 40 GM/1000ML IV SOLN
INTRAVENOUS | Status: AC
  Filled 2021-01-12: qty 1000

## 2021-01-12 MED ORDER — FUROSEMIDE 10 MG/ML IJ SOLN
20.0000 mg | Freq: Every day | INTRAMUSCULAR | Status: DC
  Administered 2021-01-12 – 2021-01-13 (×2): 20 mg via INTRAVENOUS
  Filled 2021-01-12 (×2): qty 2

## 2021-01-12 MED ORDER — MAGNESIUM SULFATE 40 GM/1000ML IV SOLN
1.0000 g/h | INTRAVENOUS | Status: AC
  Administered 2021-01-12: 1 g/h via INTRAVENOUS

## 2021-01-12 MED ORDER — SODIUM BICARBONATE 650 MG PO TABS
650.0000 mg | ORAL_TABLET | Freq: Once | ORAL | Status: AC
  Administered 2021-01-12: 650 mg
  Filled 2021-01-12: qty 1

## 2021-01-12 MED ORDER — ACETAMINOPHEN 325 MG PO TABS
650.0000 mg | ORAL_TABLET | Freq: Four times a day (QID) | ORAL | Status: DC | PRN
  Administered 2021-01-12 – 2021-01-30 (×6): 650 mg
  Filled 2021-01-12 (×8): qty 2

## 2021-01-12 MED ORDER — INSULIN REGULAR(HUMAN) IN NACL 100-0.9 UT/100ML-% IV SOLN
INTRAVENOUS | Status: DC
  Administered 2021-01-13: 2 [IU]/h via INTRAVENOUS
  Administered 2021-01-14 – 2021-01-15 (×2): 1.5 [IU]/h via INTRAVENOUS
  Administered 2021-01-16: 1 [IU]/h via INTRAVENOUS
  Administered 2021-01-18: 0.9 [IU]/h via INTRAVENOUS
  Administered 2021-01-20: 3.4 [IU]/h via INTRAVENOUS
  Administered 2021-01-21: 0.7 [IU]/h via INTRAVENOUS
  Administered 2021-01-22: 2.8 [IU]/h via INTRAVENOUS
  Administered 2021-01-24: 1.3 [IU]/h via INTRAVENOUS
  Administered 2021-01-25: 3.2 [IU]/h via INTRAVENOUS
  Administered 2021-01-26: 5 [IU]/h via INTRAVENOUS
  Administered 2021-01-27: 0.2 [IU]/h via INTRAVENOUS
  Administered 2021-01-29: 5.5 [IU]/h via INTRAVENOUS
  Administered 2021-01-30: 8 [IU]/h via INTRAVENOUS
  Administered 2021-02-02: 2.2 [IU]/h via INTRAVENOUS
  Administered 2021-02-04: 0.9 [IU]/h via INTRAVENOUS
  Administered 2021-02-05: 3.8 [IU]/h via INTRAVENOUS
  Administered 2021-02-07: 0.2 [IU]/h via INTRAVENOUS
  Administered 2021-02-08: 2.6 [IU]/h via INTRAVENOUS
  Administered 2021-02-10: 2.4 [IU]/h via INTRAVENOUS
  Administered 2021-02-11: 6.5 [IU]/h via INTRAVENOUS
  Administered 2021-02-12: 4 [IU]/h via INTRAVENOUS
  Administered 2021-02-14: 3.4 [IU]/h via INTRAVENOUS
  Administered 2021-02-15: 2 [IU]/h via INTRAVENOUS
  Administered 2021-02-16: 4.8 [IU]/h via INTRAVENOUS
  Administered 2021-02-17: 1.6 [IU]/h via INTRAVENOUS
  Administered 2021-02-20: 1.5 [IU]/h via INTRAVENOUS
  Filled 2021-01-12 (×26): qty 100

## 2021-01-12 MED ORDER — ASCORBIC ACID 500 MG PO TABS
500.0000 mg | ORAL_TABLET | Freq: Every day | ORAL | Status: DC
  Administered 2021-01-13: 500 mg via ORAL
  Filled 2021-01-12: qty 1

## 2021-01-12 MED ORDER — PANCRELIPASE (LIP-PROT-AMYL) 10440-39150 UNITS PO TABS
20880.0000 [IU] | ORAL_TABLET | Freq: Once | ORAL | Status: AC
  Administered 2021-01-12: 20880 [IU]
  Filled 2021-01-12: qty 2

## 2021-01-12 MED ORDER — DEXTROSE 50 % IV SOLN
0.0000 mL | INTRAVENOUS | Status: DC | PRN
Start: 2021-01-12 — End: 2021-02-17
  Administered 2021-01-13: 35 mL via INTRAVENOUS
  Administered 2021-01-26: 25 mL via INTRAVENOUS
  Administered 2021-01-30: 30 mL via INTRAVENOUS
  Administered 2021-02-04: 25 mL via INTRAVENOUS
  Administered 2021-02-06: 35 mL via INTRAVENOUS
  Administered 2021-02-06: 25 mL via INTRAVENOUS
  Administered 2021-02-07: 30 mL via INTRAVENOUS
  Administered 2021-02-17: 25 mL via INTRAVENOUS
  Filled 2021-01-12 (×7): qty 50

## 2021-01-12 NOTE — Progress Notes (Signed)
Endotool discontinued after receiving verification from Dr. Maryfrances Bunnell and Lujean Rave, diabetes coordinator.

## 2021-01-12 NOTE — Progress Notes (Addendum)
Called to evaluate patient due to abdominal pain.   S: Patient notes intermittent abdominal pain. She notes when she was pregnancy in 2012, she presented around the same time with contractions. She had an emergency c-section due to NRFHT. She does not recall how the incision was done, but has horizontal skin incision. It was done at [facility]. Her pain has been coming and going but has coincided with urinary retention and loose stools today. She does not have fever. She has ongoing leg pain. She continues to have N/V and had an episode of bilious emesis of small volume while I was in the room.   Patient Vitals for the past 24 hrs:  BP Temp Temp src Pulse Resp SpO2 Weight  01/12/21 1644 (!) 119/54 98 F (36.7 C) Oral 89 18 98 % --  01/12/21 1139 (!) 121/55 (!) 97.3 F (36.3 C) Axillary 88 16 98 % --  01/12/21 0741 (!) 113/49 97.7 F (36.5 C) Axillary 86 16 97 % --  01/12/21 0543 135/62 -- -- -- -- -- 71 kg  01/11/21 1957 -- 98 F (36.7 C) Oral -- -- -- --   FHT: 150, mod var + accels, no decels, appropriate for GA Toco: Irregular ctxns ranging from Q2-10 min  Abdomen: S/NT/Gravid GU: labial swelling noted and largely unchanged compared to last week CE: 2/70/-3, head present on exam  Plan:  Diarrhea/abdominal pain - C. Diff toxin will be collected with next episode - risk factors include: tube feeds, amoxicillin  Urinary retention - This is new onset today and unclear etiology. Although unusual given swelling is not markedly worse than last week, it is possibly due to labial edema. At this time doing straight cath with bladder scan  Contractions - Concern for threatened PTL at this time - At this time, she has not been considered a candidate for BMZ due to her T1DM and risk of DKA - She has not yet had Magnesium for neuroprotection but her Cr is 1.4. I have called MFM to discuss consideration for regimen and initiating Magnesium given her impaired renal function.   Update: - Given  constellation of symptoms - leg pain, diarrhea/loss of control of bowel function, urinary retention, we will check MRI of lumbar spine stat.  She does not have specific low back pain but her findings are still concerning for possible cauda equina syndrome.  - Cervix on recheck remains 2/70/-3 - Magnesium 1g for neuroprotection ordered given symptom of contractions, ctxns on the monitor, cervical dilation and history of PTB - bolus not done due to renal impairment. Will check first Mag level 4 hours into the infusion to ensure no concern for toxicity. - NST remains reactive, contractions continue Q3-10 min

## 2021-01-12 NOTE — Progress Notes (Signed)
Spoke with RN regarding order to discontinue central line. RN states she plans to discuss order with MD due to current access needs.

## 2021-01-12 NOTE — Progress Notes (Addendum)
Inpatient Diabetes Program Recommendations  AACE/ADA: New Consensus Statement on Inpatient Glycemic Control (2015)  Target Ranges:  Prepandial:   less than 140 mg/dL      Peak postprandial:   less than 180 mg/dL (1-2 hours)      Critically ill patients:  140 - 180 mg/dL   Lab Results  Component Value Date   GLUCAP 124 (H) 01/12/2021   HGBA1C 5.8 (H) 11/21/2020    Review of Glycemic Control Results for Ashley Camacho, Ashley Camacho (MRN [id number]) as of 01/12/2021 14:21  Ref. Range 01/11/2021 06:21 01/12/2021 06:58 01/12/2021 13:59  Glucose-Capillary Latest Ref Range: 70 - 99 mg/dL 83 400 (H) 867 (H)   Diabetes history: DM 1 Outpatient Diabetes medications: Omnipod with Novolog insulin (see settings below) Current orders for Inpatient glycemic control:  IV insulin transitioned to none  Inpatient Diabetes Program Recommendations:    Noted IV insulin drip discontinued. At this time, our team continues to recommend IV insulin per Endotool.   In retrospect:   7/24- Attempted transition off of Endotool with Levemir 5 units two hours prior to discontinuation. Patient experienced CBG of 34 mg/dL. No tube feeds at this time.   -In reviewing Endotool analytics, total daily dose (TDD) 7/26- TDD 15.7 units 8/16- TDD 27.9 units 8/17- TDD 34.2 units 8/18- TDD 24.4 units 8/19- TDD 26.7 units 8/20- TDD 26.5 units 8/21- TDD 27.4 units  Spoke with Dr Maryfrances Bunnell at length regarding patient's plan of care. Shared recommendation to continue with IV insulin and concern for discontinuation given patient's sensitivity to insulin, risk for DKA, lack of insulin production, lack of D5%LR running, treatment for hypoglycemic event while on Endotool, prior insulin pump rates (see below) and instability of oral intake making transition that more difficult. Currently, patient if off tube feeds due to clogged lines. Rates above include tube feeds running as ordered. MD to continue with plan. Dr Charlotta Newton aware.  Most recent  insulin pump rates from [facility]: 0.05 units/hr= 1.2 units in 24 hours, 1 units drops blood sugar 100 mg/dL, 1 units per 30 grams of CHO  In DM coordinator note from 11/20/2020: "Current pump settings are: 12-6am  0.15 units/hour 6am-12am 0.25 units/hour Total 5.4 units/24 hours  CF 80 (1 unit drops glucose 80 mg/dl) I:C 6:19 (1 unit covers 20 grams of carbs) Target BG 100 mg/dl"  No further orders given.  Follow.   Thanks, Lujean Rave, MSN, RNC-OB Diabetes Coordinator [phone] (8a-5p)

## 2021-01-12 NOTE — Progress Notes (Signed)
Physical Therapy Treatment Patient Details Name: Ashley Camacho MRN: [id number] DOB: 22-Aug-1990 Today's Date: 01/12/2021    History of Present Illness 30 y.o. 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 Medical history significant of [redacted]w[redacted]d on presentation, Estimated Date of Delivery: 03/30/21 by by 9 week ultrasound however trying to make it to 34w for planned c-section, Type 1 diabetic, Cardiomyopathy, chronic systolic heart failure, DVT in bilateral arm. H/o recent hospitalizations d/t hyperemesis and sepsis secondary to pneumonia.    PT Comments    Pt with significant decline in mobility since last week with reports of RLE pain. Pt unable to amb in the room with me today even using walker. Encouraged pt to perform active range of motion of that LE while in bed as pt is lying with leg flexed and in external rotation in a position that is most comfortable for her. Explained to pt that she needs to extend that LE to keep the mobility.    Follow Up Recommendations  Home health PT     Equipment Recommendations  Other (comment) (rollator)    Recommendations for Other Services       Precautions / Restrictions Precautions Precautions: Other (comment) Precaution Comments: frequent vomiting, cotrak. h/o RUE DVT (BPs taken in legs)    Mobility  Bed Mobility Overal bed mobility: Needs Assistance Bed Mobility: Supine to Sit;Sit to Supine     Supine to sit: Min assist Sit to supine: Min assist   General bed mobility comments: Assist to elevate trunk into sitting. Assist to bring RLE back up into bed returning to supine    Transfers Overall transfer level: Needs assistance Equipment used: None Transfers: Sit to/from [employer] Sit to Stand: Supervision Stand pivot transfers: Supervision       General transfer comment: Incr time.  Ambulation/Gait              General Gait Details: Pt unable due to RLE pain   Stairs             Wheelchair Mobility    Modified Rankin (Stroke Patients Only)       Balance Overall balance assessment: Mild deficits observed, not formally tested                                          Cognition Arousal/Alertness: Awake/alert Behavior During Therapy: Flat affect Overall Cognitive Status: Within Functional Limits for tasks assessed                                        Exercises General Exercises - Lower Extremity Ankle Circles/Pumps: AROM;5 reps;Supine Quad Sets: AROM;5 reps;Supine    General Comments        Pertinent Vitals/Pain Pain Assessment: Faces Faces Pain Scale: Hurts whole lot Pain Location: RLE Pain Descriptors / Indicators: Grimacing;Guarding;Tightness Pain Intervention(s): Limited activity within patient's tolerance;Repositioned    Home Living                      Prior Function            PT Goals (current goals can now be found in the care plan section) Progress towards PT goals: Not progressing toward goals - comment    Frequency  Min 3X/week      PT Plan Discharge plan needs to be updated    Co-evaluation              AM-PAC PT "6 Clicks" Mobility   Outcome Measure  Help needed turning from your back to your side while in a flat bed without using bedrails?: None Help needed moving from lying on your back to sitting on the side of a flat bed without using bedrails?: A Little Help needed moving to and from a bed to a chair (including a wheelchair)?: A Little Help needed standing up from a chair using your arms (e.g., wheelchair or bedside chair)?: A Little Help needed to walk in hospital room?: Total Help needed climbing 3-5 steps with a railing? : Total 6 Click Score: 15    End of Session   Activity Tolerance: Patient limited by pain Patient left: in bed;with call bell/phone within  reach Nurse Communication: Mobility status PT Visit Diagnosis: Other abnormalities of gait and mobility (R26.89);Muscle weakness (generalized) (M62.81);Pain Pain - Right/Left: Right Pain - part of body: Leg;Ankle and joints of foot     Time: 7846-9629 PT Time Calculation (min) (ACUTE ONLY): 25 min  Charges:  $Therapeutic Activity: 8-22 mins                     [facility] Acute Rehabilitation Services Pager [phone] Office [phone]    Angelina Ok [facility] 01/12/2021, 3:42 PM

## 2021-01-12 NOTE — Progress Notes (Signed)
Notified Dr. Maryfrances Bunnell with internal medicine via secure chat regarding pt's liquid bowel incontinence x3 this morning and rectal pain that pt stated started yesterday. No new orders received at this time. Will continue to monitor.

## 2021-01-12 NOTE — Progress Notes (Signed)
1300: Feeding tube noted to be clogged. Osmolite infusion paused and cortrak team paged x2. 1342:30 ml of D50 given for CBG of 57. 1348: Cortrak unclogging protocol initated, but was unsuccessful. Spoke with Marchelle Folks, from cortrak regarding situation and she stated she would hopefully make it over here at some point today. 14:00: Spoke with Dr. Maryfrances Bunnell regarding feeding tube and sugar situation. Received orders to discontinue endotool. MD to speak with diabetes coordinator to determine CBG maintenance plan. Will continue to monitor.

## 2021-01-12 NOTE — Progress Notes (Signed)
Cortrak team at bedside.

## 2021-01-12 NOTE — Progress Notes (Signed)
FACULTY PRACTICE ANTEPARTUM(COMPREHENSIVE) NOTE  Ashley Camacho is a 30 y.o. G2P0101 with Estimated Date of Delivery: 03/30/21   By  LMP [redacted]w[redacted]d  who is admitted for complications of T1DM including nausea/vomiting, anemia and malnutrition in the setting of pregnancy.    Fetal presentation is cephalic. Length of Stay:  27  Days  Date of admission:12/16/2020  Subjective: Pt resting comfortably in bed- notes continued discomfort in her right leg.  States pain is about the same as it has been- starts in her hip and extends to her toes.  Notes tingling in her toes. Some improvement with pain medication.  No headache, no blurry vision.  Tolerating some clears- notes one episode of emesis overnight.  Overnight foley placed because patient reports she "couldn't pee."  Patient reports the fetal movement as active. Patient reports uterine contraction  activity as none. Patient reports  vaginal bleeding as none. Patient describes fluid per vagina as None.  Vitals:  Blood pressure (!) 113/49, pulse 86, temperature 97.7 F (36.5 C), temperature source Axillary, resp. rate 16, height 5\' 1"  (1.549 m), weight 71 kg, SpO2 97 %. Vitals:   01/11/21 1935 01/11/21 1957 01/12/21 0543 01/12/21 0741  BP: (!) 121/53  135/62 (!) 113/49  Pulse: 87   86  Resp: 16   16  Temp:  98 F (36.7 C)  97.7 F (36.5 C)  TempSrc:  Oral  Axillary  SpO2: 97%   97%  Weight:   71 kg   Height:       Physical Examination:  General appearance - alert, no distress Mental status - depressed mood, flat affect Chest - no respiratory distress, decrease breath sounds at bases Heart - normal rate and regular rhythm Abdomen: gravid, non-tender, no suprapubic tenderness Back exam - appears edematous, no CVA tenderness Extremities - 3+ edema bilaterally, no discrete point of tenderness, no erythema Skin - warm and dry  Fetal Monitoring:  NST to be completed, ultrasonographer at bedside completing growth scan   Labs:  Results for  orders placed or performed during the hospital encounter of 12/16/20 (from the past 24 hour(s))  CBC   Collection Time: 01/12/21  4:08 AM  Result Value Ref Range   WBC 5.0 4.0 - 10.5 K/uL   RBC 2.88 (L) 3.87 - 5.11 MIL/uL   Hemoglobin 8.9 (L) 12.0 - 15.0 g/dL   HCT 01/14/21 (L) 36.6 - 29.4 %   MCV 94.4 80.0 - 100.0 fL   MCH 30.9 26.0 - 34.0 pg   MCHC 32.7 30.0 - 36.0 g/dL   RDW 76.5 (H) 46.5 - 03.5 %   Platelets 185 150 - 400 K/uL   nRBC 0.0 0.0 - 0.2 %  Creatinine, serum   Collection Time: 01/12/21  4:08 AM  Result Value Ref Range   Creatinine, Ser 1.44 (H) 0.44 - 1.00 mg/dL   GFR, Estimated 50 (L) >60 mL/min  Glucose, capillary   Collection Time: 01/12/21  6:58 AM  Result Value Ref Range   Glucose-Capillary 110 (H) 70 - 99 mg/dL   Medications:  Scheduled  sodium chloride   Intravenous Once   amoxicillin  500 mg Per Tube Q8H   Chlorhexidine Gluconate Cloth  6 each Topical Daily   doxylamine (Sleep)  12.5 mg Oral QHS   DULoxetine  20 mg Oral Daily   enoxaparin  55 mg Subcutaneous Q12H   feeding supplement (PROSource TF)  45 mL Per Tube BID   ferrous sulfate  300 mg Per Tube QODAY  folic acid  1 mg Per Tube Daily   furosemide  20 mg Intravenous Daily   gabapentin  100 mg Oral Q8H   hydrALAZINE  25 mg Per Tube Q8H   mouth rinse  15 mL Mouth Rinse q12n4p   metoCLOPramide (REGLAN) injection  5 mg Intravenous Q6H   pantoprazole (PROTONIX) IV  40 mg Intravenous Q12H   prenatal vitamin w/FE, FA  1 tablet Oral Q1200   scopolamine  1 patch Transdermal Q72H   sodium chloride flush  10-40 mL Intracatheter Q12H   sodium chloride flush  3 mL Intravenous Q12H   thiamine  100 mg Per Tube Daily   vitamin B-6  12.5 mg Oral QHS   I have reviewed the patient's current medications.  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 Grace Bushy. At this time goal remains to continue with pregnancy at 34wks   2) Chronic nausea/vomiting -currently on scheduled reglan, scop patch -vitamin B6 and unisom daily   3) FEN/GI -feeding tube in place, management per IM team -continue vitamin supplementation  -loose stools noted this am with incontinence- will review with IM team.  May consider C.diff testing   4)Type 1 DM -currently on insulin drip- reviewed with medicine plan to transition to subQ insulin -on gabapentin q 8hr  5) Nephrology -UTI- on Amox twice daily x 7 days -nephrologist on board -continue Cr daily, Lasix 20mg  daily -Foley placed overnight; output appropriate.  Reviewed with Dr. ok for removal.  Plan to keep strict I/Os will bladder scan if needed regarding concerns about inability to void -reassured pt that based on current kidney function, pt should be able to void on her own   6) Heme -Anemia  S/p 2u pRBCs, Hgb improved  Iron supplementation daily  Spontaneous bleeding has now resolved  CBC daily -h/o right brachial VTE  Lovenox decreased to 55u BID  Recheck anti-Xa this evening, following 3rd new Lovenox dose  Plan to recheck on Thurs or as clinically indicated  7) Cardio -HTN- on hydralazine IV q 8hr -EF stable, cardiology signed off   8) Right leg pain -etiology unclear, differential includes discomfort from edema, neuropathy or musculosketal on gabapentin q8hr Flexeril and tylenol prn exam unremarkable, LE doppler negative on 8/19  DISPO: Care plan reviewed with both  MFM and IM team.  Continue to work on supportive/maternal care as outlined above.  Per Dr. 9/19- initial goal will be to work on transitioning off IV insulin.  Maryfrances Bunnell, DO Attending Obstetrician & Gynecologist, [facility] for [facility]

## 2021-01-12 NOTE — Care Management (Signed)
CM continuing to follow patient for needs.   Per MD 's notes at this time plan is for removal of feeding tube prior to discharge.    [facility] referral made 12/31/20  Please call for needs or assistance.  Gretchen Short RNC-MNN, BSN Transitions of Care [facility]

## 2021-01-12 NOTE — Progress Notes (Signed)
TR[facility] night shift coverage note.  The nursing staff is requesting to change acetaminophen syrup to tablet formulation since the liquid form seems to be causing hyperglycemia.  The tablets can be crushed and given by tube without the added sugar.  Sanda Klein, MD.

## 2021-01-12 NOTE — Progress Notes (Signed)
[facility]  Assessment/ Plan: Pt is a 30 y.o. yo female  with 28 weeks of gestation G2 P1, type 1 diabetes diagnosed since age of 83, known chronic proteinuria, hypertension, DVT on anticoagulation who was admitted on 7/26 for hyperemesis gravidarum and severe malnutrition, seen as a consultation at the for the evaluation of proteinuria and AKI.  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  Patient reported that she has known proteinuria since young age and was seen by nephrologist before thought to be due to Diabetes  Blood pressure acceptable. Repeat UA with UTI, already on abx.  Kidney ultrasound with a moderate right hydronephrosis which is probably physiologic in pregnancy.  Left kidney unremarkable. Checking 24-hour urine collection for proteinuria and serologies: neg HAV/HCV/HBV. C3 and C4 WNL.  Pending  ANA, antidsDNA.  Avoid ACE inhibitor or ARB.   #Acute kidney injury probably hemodynamically mediated in the setting of intractable nausea vomiting and hypoalbuminemia, anemia concomitant with recent hospitalization with sepsis/infection. No evidence of HELLP syndrome. It seems like the creatinine level ranging between 1.2-1.4 for last few weeks.   Foley catheter was inserted for acute urinary retention.  She is nonoliguric with around 2.0 L of urine in past 24 hours.  Remains hypervolemic, will give another 20mg  IV lasix today.  Continue with strict ins and out, daily lab and avoid NSAIDs.  #Intractable nausea vomiting and severe protein calorie malnutrition: Probably also has underlying gastroparesis due to diabetes.  Continue supportive care.  She is currently on nasal feeding tube.  Per TRH/OB   #Lower extremity edema: Recent echo with EF of 50 to 55%.  Recommend high-protein diet from the tube feed.  Lasix as above   #Type 1 diabetes with chronic proteinuria:  Currently on insulin.   #Pregnancy, third trimester: Followed by OB team.  Continue vitamin and oral supplement per them.   #Anemia: Received 2 units of blood transfusion on 8/20.  Reportedly patient has some epistaxis and also on anticoagulation.  This is managing by medicine team.     #Urinary tract infection on amoxicillin for 7 days.  Subjective:  2L UOP yesterday, stable SCR this AM No c/o, having some N/V on and off   Objective Vital signs in last 24 hours: Vitals:   01/11/21 1935 01/11/21 1957 01/12/21 0543 01/12/21 0741  BP: (!) 121/53  135/62 (!) 113/49  Pulse: 87   86  Resp: 16   16  Temp:  98 F (36.7 C)  97.7 F (36.5 C)  TempSrc:  Oral  Axillary  SpO2: 97%   97%  Weight:   71 kg   Height:       Weight change: -4.746 kg  Intake/Output Summary (Last 24 hours) at 01/12/2021 0901 Last data filed at 01/12/2021 0741 Gross per 24 hour  Intake 3784.85 ml  Output 2002 ml  Net 1782.85 ml        Labs: Basic Metabolic Panel: Recent Labs  Lab 01/09/21 0544 01/10/21 0610 01/11/21 0759 01/12/21 0408  NA 139 135 139  --   K 4.9 5.0 5.0  --   CL 111 109 111  --   CO2 21* 20* 21*  --   GLUCOSE 111* 91  91 101*  --   BUN 36* 39* 39*  --   CREATININE 1.38* 1.47* 1.39* 1.44*  CALCIUM 8.1* 8.2* 8.2*  --   PHOS  --  5.9*  5.6*  --     Liver Function Tests: Recent Labs  Lab 01/06/21 0450 01/10/21 0610 01/11/21 0759  AST 28  --   --   ALT 20  --   --   ALKPHOS 163*  --   --   BILITOT 1.5*  --   --   PROT 5.6*  --   --   ALBUMIN 2.6* 2.8* 2.4*    No results for input(s): LIPASE, AMYLASE in the last 168 hours. No results for input(s): AMMONIA in the last 168 hours. CBC: Recent Labs  Lab 01/06/21 0450 01/08/21 0320 01/10/21 1310 01/11/21 0218 01/11/21 0524 01/12/21 0408  WBC 5.0  --  4.2  --  5.1 5.0  NEUTROABS  --   --  2.9  --   --   --   HGB 8.2*   < > 6.9* 9.2* 9.1* 8.9*  HCT 26.1*   < > 21.9* 28.3* 27.7* 27.2*  MCV 95.3  --  95.2  --  92.6  94.4  PLT 177  --  185  --  186 185   < > = values in this interval not displayed.    Cardiac Enzymes: No results for input(s): CKTOTAL, CKMB, CKMBINDEX, TROPONINI in the last 168 hours. CBG: Recent Labs  Lab 01/08/21 0621 01/09/21 0625 01/10/21 0506 01/11/21 0621 01/12/21 0658  GLUCAP 98 121* 97 83 110*     Iron Studies: No results for input(s): IRON, TIBC, TRANSFERRIN, FERRITIN in the last 72 hours. Studies/Results: No results found.  Medications: Infusions:  sodium chloride Stopped (01/11/21 2000)   feeding supplement (OSMOLITE 1.5 CAL) 1,000 mL (01/11/21 1507)   insulin 1.7 Units/hr (01/12/21 0857)   lactated ringers 10 mL/hr at 01/12/21 0628   promethazine (PHENERGAN)25mg  in 35mL NS 25 mg (01/08/21 2252)    Scheduled Medications:  sodium chloride   Intravenous Once   amoxicillin  500 mg Per Tube Q8H   Chlorhexidine Gluconate Cloth  6 each Topical Daily   doxylamine (Sleep)  12.5 mg Oral QHS   DULoxetine  20 mg Oral Daily   enoxaparin  55 mg Subcutaneous Q12H   feeding supplement (PROSource TF)  45 mL Per Tube BID   ferrous sulfate  300 mg Per Tube QODAY   folic acid  1 mg Per Tube Daily   gabapentin  100 mg Oral Q8H   hydrALAZINE  25 mg Per Tube Q8H   mouth rinse  15 mL Mouth Rinse q12n4p   metoCLOPramide (REGLAN) injection  5 mg Intravenous Q6H   pantoprazole (PROTONIX) IV  40 mg Intravenous Q12H   prenatal vitamin w/FE, FA  1 tablet Oral Q1200   scopolamine  1 patch Transdermal Q72H   sodium chloride flush  10-40 mL Intracatheter Q12H   sodium chloride flush  3 mL Intravenous Q12H   thiamine  100 mg Per Tube Daily   vitamin B-6  12.5 mg Oral QHS    have reviewed scheduled and prn medications.  Physical Exam: General: Lying on bed, not in distress ENT: Coretrack in place Heart:RRR, s1s2 nl Lungs: Distant breath sound, no wheeze or crackle appreciated Abdomen:soft, nontender, distended with pregnancy Extremities: Bilateral lower extremity pitting  edema 2-3+ Neurology: Alert awake.  Bertine Schlottman B Kairos Panetta 01/12/2021,9:01 AM  LOS: 27 days

## 2021-01-12 NOTE — Progress Notes (Signed)
[facility] PROGRESS NOTE    Ashley Camacho  ZOX:[id number] DOB: 12/25/1990 DOA: 12/16/2020 PCP: Hildred Laser, MD      Brief Narrative:  Mrs. Hitson is a 30 y.o. F with pregnancy at [redacted]w[redacted]d, new cardiomyopathy, T1DM on insulin pump, arm DVT on enoxaparin, hx gastroparesis (pos GES in 2017 at [facility]), and anemia who presented with recurrent nausea and vomiting.  Since early in this pregnancy, patient has had recurrent hypoglycemia, similar to her previous pregnancy 10 years ago.  Also noted to have worsening of her anemia down to the 7s.  Then around 18 weeks, she began to have fairly persistent nausea.  This nausea contributed to frequent severe hypogylcemia, and she was admitted to [facility] on 6/8, admitted to [facility] on 6/29, and admitted to [facility] on 7/4 for 2 weeks.  During that last hospitalization, she was also diagnosed with multifocal pneumonia due to rhinovirus, had thoracentesis with transudate, completed 10 days CTX/azithro and had negative sputum culture.  Also, diagnosed with RIGHT brachial vein DVT and started on Lovenox.  Also diagnosed with cardiomyopathy, EF 45% and congestive heart failure flare, evaluated by Cardiology, who diuresed with Lasix.    Also, had persistent poor oral intake during that hospitalization, which is described in the 7/20 discharge summary as "oral intake aversion greater than expected with observed symptoms".  Was started on Reglan and mirtazapine but did not have improvement in symptoms, was recommended to have NG or PEG but declined, and was discharged after refusing medical therapies under advisement that she was not able to achieve adequate caloric intake.  Subsequently came to [facility] 2 days later again not able to take PO.  Ultimately transferred to [facility] for MFM consultation and Cortrak placement.             Assessment & Plan:  Inadequate oral intake Moderate protein calorie malnutrition This is outside what would be expected for  gastroparesis.  I defer to MFM and OB about HG, but this seems out of proportion for that as well.    Truthfully, to me today, she denies nausea, says she has not vomited any time recently.  She just "does not want to eat" and "is not hungry". When probed, she indicates a fear of nausea only, then becomes avoidant to further questioning.  Mirtazapine was not helpful during her previous trial at [facility], so will defer this.  Per MFM, their expectation is that she will need to stay in the hospital, with Cortrak until delivery at 34 weeks  Need to rule out an organic cause of anorexia and aversion to oral intake.    - Consult GI, appreciate expertise       Anemia, normocytic This also seems way out of proportion to anemia of pregnancy.    Review of her chart shows baseline Hgb in years past ~13 g/dL.  Was ~10 g/dL in Jan 8119.    Starting in May this year it has been ~7 g/dl, and since June, she has had 7 units of blood transfused, just in our system, four during the current hospitalization.  May iron studies with Ferritin 14 and iron sat 13%.  More recent ferritins are >600s, somehow reflecting inflammation.  Haptoglobins and LDH have been normal.  Smear in late July normal.  Reticulocyte count has been drifting down since May, now hypoproliferative.  Nutritional deficiencies should be considered.    - Consult GI - Check Vitamin C, B1, B6  - Continue folate and thiamine, start vitamin C  Pregnancy - Consult MFM, appreciate expertise   Type 1 diabetes On Omnipump at home.  Here on 14-day Freestyle meter and insulin drip, recreating her home pump. - Continue combination freestyle and insulin infusion   UTI - Continue amox to complete 7 days tonight   Acute on chronic systolic CHF This was a new development this pregnancy.  Noted at [facility] in July.    Here, was diuresed by Cardiology, who feel she is now at limits of diuresis.  Note, she is 20 kg over her normal weight of  52kg.   - Continue daily Lasix   Acute kidney injury Mild.  Baseline Cr we think was 0.6-0.8, here it is up to 1.4, stable.  Nephrology suspect it is hemodynamic in the setting of hypoalbuminemia and low circulating volume.   Leg pain This is a sharp pain, in the outer right knee, radiating into the right foot.  I see no cause.  Is it a mononeuritis?  I do not suspect DVT as she is on Lovenox. - Oxycodone PRN    Right arm DVT - Continue Lovenox          Disposition: Status is: Inpatient  Remains inpatient appropriate because:IV treatments appropriate due to intensity of illness or inability to take PO  Dispo: The patient is from: Home              Anticipated d/c is to: Home              Patient currently is not medically stable to d/c.   Difficult to place patient No       Level of care: Antepartum       MDM: The below labs and imaging reports were reviewed and summarized above.  Medication management as above.    DVT prophylaxis: SCDs Start: 12/18/20 1433  Code Status: FULL Family Communication:             Subjective: No nausea,  no recent vomiting.  Very weak, very tired, somewhat slowed in answers.  Pain on the outside of right leg, sharp in character, constant.  Objective: Vitals:   01/12/21 0543 01/12/21 0741 01/12/21 1139 01/12/21 1644  BP: 135/62 (!) 113/49 (!) 121/55 (!) 119/54  Pulse:  86 88 89  Resp:  16 16 18   Temp:  97.7 F (36.5 C) (!) 97.3 F (36.3 C) 98 F (36.7 C)  TempSrc:  Axillary Axillary Oral  SpO2:  97% 98% 98%  Weight: 71 kg     Height:        Intake/Output Summary (Last 24 hours) at 01/12/2021 1823 Last data filed at 01/12/2021 1740 Gross per 24 hour  Intake 3990.62 ml  Output 2302 ml  Net 1688.62 ml   Filed Weights   01/10/21 0457 01/11/21 0622 01/12/21 0543  Weight: 72.2 kg 75.7 kg 71 kg    Examination: General appearance:   adult female, awake, appears tired  HEENT: Anicteric, conjunctiva  pink, lids and lashes normal. No nasal deformity, discharge, epistaxis.  Lips dry and cracked, some glossitis.   Skin: Warm and dry.  No rash that I can see. Pallor noted. Cardiac: Tachycardic, regular, soft flow murmr, 2+ edema to both legs. Radial pulses symmetric. Respiratory: Normal respiratory rate and rhythm.  CTAB without rales or wheezes. Abdomen: Abdomen soft.  Gravid.  No TTP.   MSK: No deformities or effusions. The right knee appears normal Neuro: Awake and alert, but weak and tired.  EOMI, moves all extremities with severe generalized  weakness, no asterixis. Speech fluent.    Psych: Sensorium intact and responding to questions, eye contact diminished, mild psychomotor slowing, affect flat.          Data Reviewed: I have personally reviewed following labs and imaging studies:  CBC: Recent Labs  Lab 01/06/21 0450 01/08/21 0320 01/09/21 0544 01/10/21 1310 01/11/21 0218 01/11/21 0524 01/12/21 0408  WBC 5.0  --   --  4.2  --  5.1 5.0  NEUTROABS  --   --   --  2.9  --   --   --   HGB 8.2*   < > 7.4* 6.9* 9.2* 9.1* 8.9*  HCT 26.1*   < > 22.6* 21.9* 28.3* 27.7* 27.2*  MCV 95.3  --   --  95.2  --  92.6 94.4  PLT 177  --   --  185  --  186 185   < > = values in this interval not displayed.   Basic Metabolic Panel: Recent Labs  Lab 01/06/21 0450 01/08/21 0320 01/09/21 0544 01/10/21 0610 01/11/21 0759 01/12/21 0408  NA 142 141 139 135 139  --   K 4.8 5.0 4.9 5.0 5.0  --   CL 112* 111 111 109 111  --   CO2 23 22 21* 20* 21*  --   GLUCOSE 101* 97 111* 91  91 101*  --   BUN 34* 34* 36* 39* 39*  --   CREATININE 1.36* 1.34* 1.38* 1.47* 1.39* 1.44*  CALCIUM 8.6* 8.4* 8.1* 8.2* 8.2*  --   PHOS  --   --   --  5.9* 5.6*  --    GFR: Estimated Creatinine Clearance: 52 mL/min (A) (by C-G formula based on SCr of 1.44 mg/dL (H)). Liver Function Tests: Recent Labs  Lab 01/06/21 0450 01/10/21 0610 01/11/21 0759  AST 28  --   --   ALT 20  --   --   ALKPHOS 163*  --   --    BILITOT 1.5*  --   --   PROT 5.6*  --   --   ALBUMIN 2.6* 2.8* 2.4*   No results for input(s): LIPASE, AMYLASE in the last 168 hours. No results for input(s): AMMONIA in the last 168 hours. Coagulation Profile: Recent Labs  Lab 01/09/21 1642 01/10/21 1648  INR 1.0 0.9   Cardiac Enzymes: No results for input(s): CKTOTAL, CKMB, CKMBINDEX, TROPONINI in the last 168 hours. BNP (last 3 results) No results for input(s): PROBNP in the last 8760 hours. HbA1C: No results for input(s): HGBA1C in the last 72 hours. CBG: Recent Labs  Lab 01/11/21 0621 01/12/21 0658 01/12/21 1359 01/12/21 1530 01/12/21 1649  GLUCAP 83 110* 124* 87 110*   Lipid Profile: No results for input(s): CHOL, HDL, LDLCALC, TRIG, CHOLHDL, LDLDIRECT in the last 72 hours. Thyroid Function Tests: No results for input(s): TSH, T4TOTAL, FREET4, T3FREE, THYROIDAB in the last 72 hours. Anemia Panel: No results for input(s): VITAMINB12, FOLATE, FERRITIN, TIBC, IRON, RETICCTPCT in the last 72 hours. Urine analysis:    Component Value Date/Time   COLORURINE AMBER (A) 01/10/2021 0131   APPEARANCEUR CLOUDY (A) 01/10/2021 0131   APPEARANCEUR Clear 09/18/2020 1153   LABSPEC 1.017 01/10/2021 0131   LABSPEC 1.026 03/01/2013 1844   PHURINE 5.0 01/10/2021 0131   GLUCOSEU NEGATIVE 01/10/2021 0131   GLUCOSEU >=500 03/01/2013 1844   HGBUR NEGATIVE 01/10/2021 0131   BILIRUBINUR NEGATIVE 01/10/2021 0131   BILIRUBINUR neg 10/23/2020 0953   BILIRUBINUR Negative 09/18/2020 1153  BILIRUBINUR Negative 03/01/2013 1844   KETONESUR NEGATIVE 01/10/2021 0131   PROTEINUR NEGATIVE 01/10/2021 0131   UROBILINOGEN 0.2 10/23/2020 0953   NITRITE NEGATIVE 01/10/2021 0131   LEUKOCYTESUR SMALL (A) 01/10/2021 0131   LEUKOCYTESUR Negative 03/01/2013 1844   Sepsis Labs: @LABRCNTIP (procalcitonin:4,lacticacidven:4)  ) Recent Results (from the past 240 hour(s))  OB Urine Culture     Status: Abnormal   Collection Time: 01/05/21  2:32 PM    Specimen: Urine, Random  Result Value Ref Range Status   Specimen Description URINE, RANDOM  Final   Special Requests NONE  Final   Culture (A)  Final    >=100,000 COLONIES/mL ENTEROCOCCUS FAECALIS NO GROUP B STREP (S.AGALACTIAE) ISOLATED Performed at [facility], [address] [place], [place]    Report Status 01/07/2021 FINAL  Final   Organism ID, Bacteria ENTEROCOCCUS FAECALIS (A)  Final      Susceptibility   Enterococcus faecalis - MIC*    AMPICILLIN <=2 SENSITIVE Sensitive     NITROFURANTOIN <=16 SENSITIVE Sensitive     VANCOMYCIN 2 SENSITIVE Sensitive     * >=100,000 COLONIES/mL ENTEROCOCCUS FAECALIS         Radiology Studies: 01/09/2021 MFM OB FOLLOW UP  Result Date: 01/12/2021 ----------------------------------------------------------------------  OBSTETRICS REPORT                       (Signed Final 01/12/2021 04:53 pm) ---------------------------------------------------------------------- Patient Info  ID #:       01/14/2021                          D.O.B.:  12/22/1990 (29 yrs)  Name:       ELYSA WOMAC [facility]                 Visit Date: 01/12/2021 09:48 am ---------------------------------------------------------------------- Performed By  Attending:        01/14/2021      Ref. Address:     [facility]                    MD                                                             [facility]                                                             [address]                                                             [address]  [address]                                                             [place]                                                             [address]  Performed By:     Reinaldo Raddle            [facility]  Referred By:      Linzie Collin MD  ---------------------------------------------------------------------- Orders  #  Description                           Code        Ordered By  1  [place] MFM OB FOLLOW UP                   [id number]    Milas Hock ----------------------------------------------------------------------  #  Order #                     Accession #                Episode #  1  [id number]                   [id number]                 [id number] ---------------------------------------------------------------------- Indications  Polyhydramnios, second trimester,              O40.2XX0  antepartum condition or complication, fetus  unspecified  Pre-existing diabetes, type 1, in pregnancy,   O24.012  second trimester  Hyperemesis gravidarum                         O21.0  Anemia during pregnancy in second trimester    O99.012  Abnormal fetal ultrasound (? liver             O28.9  calcification)  [redacted] weeks gestation of pregnancy                Z3A.29 ---------------------------------------------------------------------- Fetal Evaluation  Num Of Fetuses:         1  Fetal Heart Rate(bpm):  133  Cardiac Activity:       Observed  Presentation:           Cephalic  Placenta:               Posterior  P. Cord Insertion:      Previously  Visualized  Amniotic Fluid  AFI FV:      Polyhydramnios  AFI Sum(cm)     %Tile       Largest Pocket(cm)  34.2            > 97        12.7  RUQ(cm)       RLQ(cm)       LUQ(cm)        LLQ(cm)  10.4          3.8           7.3            12.7 ---------------------------------------------------------------------- Biometry  BPD:      74.2  mm     G. Age:  29w 5d         63  %    CI:        82.27   %    70 - 86                                                          FL/HC:      19.9   %    19.6 - 20.8  HC:      258.1  mm     G. Age:  28w 0d        4.1  %    HC/AC:      1.02        0.99 - 1.21  AC:      253.9  mm     G. Age:  29w 4d         62  %    FL/BPD:     69.1   %    71 - 87  FL:       51.3  mm     G. Age:  27w 3d          5  %     FL/AC:      20.2   %    20 - 24  Est. FW:    1277  gm    2 lb 13 oz      28  % ---------------------------------------------------------------------- Gestational Age  U/S Today:     28w 5d                                        EDD:   04/01/21  Best:          29w 0d     Det. ByMarcella Dubs         EDD:   03/30/21                                      (08/27/20) ---------------------------------------------------------------------- Anatomy  Cranium:               Appears normal         Aortic Arch:            Previously seen  Cavum:                 Previously seen  Ductal Arch:            Previously seen  Ventricles:            Appears normal         Diaphragm:              Appears normal  Choroid Plexus:        Previously seen        Stomach:                Appears normal, left                                                                        sided  Cerebellum:            Previously seen        Abdomen:                Previously seen  Posterior Fossa:       Previously seen        Abdominal Wall:         Previously seen  Nuchal Fold:           Previously seen        Cord Vessels:           Previously seen  Lips:                  Previously seen        Kidneys:                Appear normal  Thoracic:              Appears normal         Bladder:                Appears normal  Heart:                 Previously seen        Spine:                  Previously seen  RVOT:                  Previously seen        Upper Extremities:      Previously seen  LVOT:                  Previously seen        Lower Extremities:      Previously seen  Other:  Technically difficult due to fetal position. ---------------------------------------------------------------------- Cervix Uterus Adnexa  Cervix  Not visualized (advanced GA >24wks) ---------------------------------------------------------------------- Impression  Follow up growth given poorly controlled DM with  gastroparesis and NG tube feeding.  Ms. Wrisley remains  hospitalized with chronic nausea and  vomiting.  Normal interval growth with measurements consistent with  dates  Good fetal movement and moderate-severe polyhydramnios  I discussed the plan of care with Dr. Maryfrances Bunnell. We discussed  continued management of blood sugars with insulin drip and  have agreement with advancing her diet with the goal of  removing the NG tube.  Continue weekly testing with repeat growth in 3 weeks.  Will aim for possible  delivery at 34 weeks, depending on her  status and improvement. ---------------------------------------------------------------------- Recommendations  Continue weekly testing  Repeat growth in 3-4 weeks. ----------------------------------------------------------------------               Lin Landsmanorenthian Booker, MD Electronically Signed Final Report   01/12/2021 04:53 pm ----------------------------------------------------------------------       Scheduled Meds:  sodium chloride   Intravenous Once   amoxicillin  500 mg Per Tube Q8H   Chlorhexidine Gluconate Cloth  6 each Topical Daily   enoxaparin  55 mg Subcutaneous Q12H   feeding supplement (PROSource TF)  45 mL Per Tube BID   folic acid  1 mg Per Tube Daily   furosemide  20 mg Intravenous Daily   mouth rinse  15 mL Mouth Rinse q12n4p   metoCLOPramide (REGLAN) injection  5 mg Intravenous Q6H   pantoprazole (PROTONIX) IV  40 mg Intravenous Q12H   prenatal vitamin w/FE, FA  1 tablet Oral Q1200   sodium chloride flush  10-40 mL Intracatheter Q12H   sodium chloride flush  3 mL Intravenous Q12H   thiamine  100 mg Per Tube Daily   vitamin B-6  12.5 mg Oral QHS   Continuous Infusions:  sodium chloride Stopped (01/11/21 2000)   feeding supplement (OSMOLITE 1.5 CAL) 50 mL/hr at 01/12/21 1600   insulin 1.5 Units/hr (01/12/21 1756)   lactated ringers 10 mL/hr at 01/12/21 [id number]   promethazine (PHENERGAN)25mg  in 50mL NS 25 mg (01/08/21 2252)     LOS: 27 days    Time spent: 60 mimnutes    Alberteen Samhristopher P  Dehaven Sine, MD [facility] 01/12/2021, 6:23 PM     Please page though AMION or Epic secure chat:  For [employer] password, Higher education careers advisercontact charge nurse

## 2021-01-13 ENCOUNTER — Inpatient Hospital Stay (HOSPITAL_COMMUNITY): Payer: Medicaid Other

## 2021-01-13 DIAGNOSIS — O99019 Anemia complicating pregnancy, unspecified trimester: Secondary | ICD-10-CM

## 2021-01-13 LAB — COMPREHENSIVE METABOLIC PANEL
ALT: 19 U/L (ref 0–44)
AST: 30 U/L (ref 15–41)
Albumin: 2.2 g/dL — ABNORMAL LOW (ref 3.5–5.0)
Alkaline Phosphatase: 156 U/L — ABNORMAL HIGH (ref 38–126)
Anion gap: 5 (ref 5–15)
BUN: 35 mg/dL — ABNORMAL HIGH (ref 6–20)
CO2: 21 mmol/L — ABNORMAL LOW (ref 22–32)
Calcium: 8.3 mg/dL — ABNORMAL LOW (ref 8.9–10.3)
Chloride: 109 mmol/L (ref 98–111)
Creatinine, Ser: 1.34 mg/dL — ABNORMAL HIGH (ref 0.44–1.00)
GFR, Estimated: 55 mL/min — ABNORMAL LOW (ref >60)
Glucose, Bld: 109 mg/dL — ABNORMAL HIGH (ref 70–99)
Potassium: 4.6 mmol/L (ref 3.5–5.1)
Sodium: 135 mmol/L (ref 135–145)
Total Bilirubin: 1.8 mg/dL — ABNORMAL HIGH (ref 0.3–1.2)
Total Protein: 4.9 g/dL — ABNORMAL LOW (ref 6.5–8.1)

## 2021-01-13 LAB — C DIFFICILE (CDIFF) QUICK SCRN (NO PCR REFLEX)
C Diff antigen: NEGATIVE
C Diff interpretation: NOT DETECTED
C Diff toxin: NEGATIVE

## 2021-01-13 LAB — GLUCOSE, CAPILLARY
Glucose-Capillary: 138 mg/dL — ABNORMAL HIGH (ref 70–99)
Glucose-Capillary: 111 mg/dL — ABNORMAL HIGH (ref 70–99)
Glucose-Capillary: 116 mg/dL — ABNORMAL HIGH (ref 70–99)
Glucose-Capillary: 113 mg/dL — ABNORMAL HIGH (ref 70–99)
Glucose-Capillary: 115 mg/dL — ABNORMAL HIGH (ref 70–99)
Glucose-Capillary: 68 mg/dL — ABNORMAL LOW (ref 70–99)
Glucose-Capillary: 107 mg/dL — ABNORMAL HIGH (ref 70–99)
Glucose-Capillary: 110 mg/dL — ABNORMAL HIGH (ref 70–99)
Glucose-Capillary: 131 mg/dL — ABNORMAL HIGH (ref 70–99)
Glucose-Capillary: 137 mg/dL — ABNORMAL HIGH (ref 70–99)
Glucose-Capillary: 130 mg/dL — ABNORMAL HIGH (ref 70–99)
Glucose-Capillary: 106 mg/dL — ABNORMAL HIGH (ref 70–99)
Glucose-Capillary: 109 mg/dL — ABNORMAL HIGH (ref 70–99)
Glucose-Capillary: 103 mg/dL — ABNORMAL HIGH (ref 70–99)
Glucose-Capillary: 121 mg/dL — ABNORMAL HIGH (ref 70–99)
Glucose-Capillary: 138 mg/dL — ABNORMAL HIGH (ref 70–99)
Glucose-Capillary: 150 mg/dL — ABNORMAL HIGH (ref 70–99)

## 2021-01-13 LAB — CBC
HCT: 26.1 % — ABNORMAL LOW (ref 36.0–46.0)
Hemoglobin: 8.2 g/dL — ABNORMAL LOW (ref 12.0–15.0)
MCH: 30 pg (ref 26.0–34.0)
MCHC: 31.4 g/dL (ref 30.0–36.0)
MCV: 95.6 fL (ref 80.0–100.0)
Platelets: 207 10*3/uL (ref 150–400)
RBC: 2.73 MIL/uL — ABNORMAL LOW (ref 3.87–5.11)
RDW: 15.9 % — ABNORMAL HIGH (ref 11.5–15.5)
WBC: 5 10*3/uL (ref 4.0–10.5)
nRBC: 0 % (ref 0.0–0.2)

## 2021-01-13 LAB — MAGNESIUM
Magnesium: 10.2 mg/dL (ref 1.7–2.4)
Magnesium: 3.9 mg/dL — ABNORMAL HIGH (ref 1.7–2.4)

## 2021-01-13 MED ORDER — VITAMIN D 25 MCG (1000 UNIT) PO TABS
1000.0000 [IU] | ORAL_TABLET | Freq: Every day | ORAL | Status: DC
  Administered 2021-01-13 – 2021-01-15 (×3): 1000 [IU] via ORAL
  Filled 2021-01-13 (×3): qty 1

## 2021-01-13 MED ORDER — DEXTROSE 5 % IV SOLN
Freq: Every day | INTRAVENOUS | Status: DC
  Filled 2021-01-13 (×4): qty 50

## 2021-01-13 MED ORDER — BETAMETHASONE SOD PHOS & ACET 6 (3-3) MG/ML IJ SUSP
12.0000 mg | INTRAMUSCULAR | Status: AC
  Administered 2021-01-13 – 2021-01-14 (×2): 12 mg via INTRAMUSCULAR
  Filled 2021-01-13: qty 5

## 2021-01-13 MED ORDER — FUROSEMIDE 10 MG/ML IJ SOLN
20.0000 mg | Freq: Two times a day (BID) | INTRAMUSCULAR | Status: DC
  Administered 2021-01-13 – 2021-01-14 (×3): 20 mg via INTRAVENOUS
  Filled 2021-01-13 (×3): qty 2

## 2021-01-13 MED ORDER — THIAMINE HCL 100 MG/ML IJ SOLN
100.0000 mg | Freq: Every day | INTRAMUSCULAR | Status: DC
  Administered 2021-01-13 – 2021-01-14 (×2): 100 mg via INTRAVENOUS
  Filled 2021-01-13 (×3): qty 1

## 2021-01-13 MED ORDER — DEXTROSE IN LACTATED RINGERS 5 % IV SOLN: INTRAVENOUS | Status: DC

## 2021-01-13 MED ORDER — ONDANSETRON HCL 4 MG/2ML IJ SOLN
4.0000 mg | Freq: Three times a day (TID) | INTRAMUSCULAR | Status: DC
  Administered 2021-01-13 – 2021-01-16 (×9): 4 mg via INTRAVENOUS
  Filled 2021-01-13 (×9): qty 2

## 2021-01-13 MED ORDER — PYRIDOXINE HCL 100 MG/ML IJ SOLN
100.0000 mg | Freq: Every day | INTRAMUSCULAR | Status: DC
  Administered 2021-01-13 – 2021-01-14 (×2): 100 mg via INTRAVENOUS
  Filled 2021-01-13 (×2): qty 1

## 2021-01-13 MED ORDER — NON FORMULARY: 300.0000 mg | Freq: Every day | Status: DC

## 2021-01-13 MED ORDER — ASCORBIC ACID 25000 MG/50ML IV SOLN: 300.0000 mg | Freq: Every day | INTRAVENOUS | Status: DC

## 2021-01-13 NOTE — Progress Notes (Signed)
[facility] PROGRESS NOTE    Ashley Camacho  ZOX:[id number] DOB: 1990-09-18 DOA: 12/16/2020 PCP: Hildred Laserherry, Anika, MD      Brief Narrative:  Mrs. Ashley Gustinshby is a 30 y.o. F with pregnancy at 5976w1d, new cardiomyopathy, T1DM on insulin pump, arm DVT on enoxaparin, hx gastroparesis ([facility]), and anemia who presented with recurrent nausea and vomiting.  See longer summary from 8/22             Assessment & Plan:  Vomiting Inadequate oral intake See prog note 8/22  Vomited "2 or 3" times today.  This is described as "bilious".    To date, standard treatments for nausea and vomiting, including treatments for difficult-to-treat nausea like gastroparesis have been ineffective.  Given that she has tolerated tube feeds at her goal rate for weeks, with only 1-3 emesis per day, I am incredulous that gastric motility is the problem.  GI seek to rule out other organic causes of vomiting  - Consult GI, appreciate expertise - Follow lipase and TSH (latter [facility])  - Follow US abdomen results - Follow EGD results       Moderate protein calorie malnutrition This is the result of severely reduced oral intake since probably May, maybe earlier. - Continue tube feeds  - MFM expect we will continue tube feeds via Cortrak until 34 weeks and then deliver; Cortrak team support this plan    Given the long duration of poor oral intake, as well as her cardiomyopathy and anemia, nutritional deficiencies should be considered: - Follow thiamine and pyridoxine levels - Follow vitamin C and copper levels   - Continue folate, vitamin D - Given safety profile, will transition empirically thiamine to IV dosage for 3 days, pyridoxine and vitamin C to IV dosage for 3 weeks or until serum levels return     Anemia, normocytic See progress note 8/22  This is out of proportion to anemia of pregnancy.  There has been scant epistaxis and "dark"  emesis, but no unambiguous clinical bleeding.  - Consult GI to rule out GI bleeding  Haptoglobin/LDH and smear normal earlier.  Will repeat to make sure this isn't evolving. - Follow repeat haptoglobin, LDH, smear  I notice her reticulocyte index has been dropping over the last 2 months (iron, B12 and folate have been repleted)  - Check copper/ceruloplasmin levels - Check vitamin C level   - Transfusion threshold 7 g/dL unless indicated by OB      Pregnancy Steroids for lung maturity given today. - Consult to Obstetrics    Type 1 diabetes She has been insulin sensitive and our safest (and certainly most effective) glucose management plan will be to use 14-day Freestyle CGMs and Endotool, analogous to her home insulin pump.  - Continue Freestyle and Endotool until reliably taking oral intake     UTI Treated and resolved.   Acute on chronic systolic CHF This was a new development this pregnancy.  Noted at [facility] in July.    Here, was diuresed by Cardiology, who have now signed off.  She remains 20 kg over her normal weight of 52kg.  Discussed her renal function with Nephrology, okay to push diuresis as she is clearly fluid overloaded, not oliguric and has had stable renal function.    CXR today still shows small effusions, congestion. - Continue Lasix, increase to twice daily - Daily BMP - Strict IOs     Acute kidney injury Mild.  Baseline Cr we think was 0.6-0.8, here it is up to 1.4.  has remained very stable.   Nephrology suspect it is hemodynamic in the setting of hypoalbuminemia and low circulating volume and have signed off.  ANA and complements normal - Follow up with Nephrology after discharge - Avoid nephrotoxins   Leg pain, unclear etiology This is the left leg, mostly lower leg, mostly outer.  No skin changes. OB have ordered MRI lumbar spine which was normal and US DVT which was also normal.  The pulse and cap refill are good, ischemia ruled out. No  trauma, and onset of pain at rest, can't be fracture. It is likely a neuritis or simply from edema. - Symptomatic cares      Right arm DVT - Continue Lovenox    Urinary retention Cauda equina was not clinically present, and MRI lumbar spine was normal.  OB think her labial swelling is not contributing.  Copper deficiency can cause bladder dysfunction, although I think this is more likely just swelling/anasarca.    Lines I ordered her foley to be removed, and it was replaced overnight.  I ordered her PICC line to be removed, and nursing have declined to do so.  Both of these devices carry a risk of infection that outweighs their benefit in my medical opinion.  I have offered my opinion that Cortrak failure becomes increasingly likely with time, and complications to the nose or esophagus are real.         Disposition: Status is: Inpatient  Remains inpatient appropriate because:IV treatments appropriate due to intensity of illness or inability to take PO  Dispo: The patient is from: Home              Anticipated d/c is to: Home              Patient currently is not medically stable to d/c.   Difficult to place patient No    Level of care: Antepartum      This is a 30 yo F at [redacted]w[redacted]d who presented with malnutrition from vomiting.    She has a constellation of systemic symptoms including profound anemia, cardiomyopathy, renal insufficiency and malnutrition which have not yet been unified in a satisfying way.  The working diagnosis to this point has been that she had gastroparesis and nausea of pregnancy that led to malnutrition/hypoalbuminemia and third-spacing and that if we could ameliorate the vomiting, we could resolve her illness.  This line of reasoning, however, cannot (in my mind) explain the anemia and cardiomyopathy (unless a pretty profound nutritional deficiency is found, which is unlikely).    An alternative view might be that the patient's anemia preceded her  other problems.  Looking back to early May, her Hgb on initial presentation was 7.2 g/dL, a new finding.  If the sequence of events was that anemia drove high output heart failure, and heart failure drove anorexia and malnutrition, this may unify the picture better.  The key piece of that puzzle would likely be whether we can find a source of her anemia, either by endoscopy or by laboratory study.  If we cannot, it may be that MFM must make difficult decisions about timing of delivery so that the medical team can properly evaluate the cause of her anemia, heart failure and renal insufficiency.        MDM: The below labs and imaging reports were reviewed and summarized above.  Medication management as above.    DVT prophylaxis: N/A on  Lovenox  Code Status: FULL Family Communication:             Subjective: Vomited today.  "Like stomach juices".  Still weak.  Still swollen.  Unable to pee.  Has right leg pain, no real ataxia.  Objective: Vitals:   01/13/21 0920 01/13/21 1138 01/13/21 1145 01/13/21 1612  BP:  (!) 126/53  (!) 136/59  Pulse:  88  89  Resp:  18  18  Temp:  97.8 F (36.6 C)  98.2 F (36.8 C)  TempSrc:  Oral  Oral  SpO2: 93% 95% 97% 98%  Weight:      Height:        Intake/Output Summary (Last 24 hours) at 01/13/2021 1728 Last data filed at 01/13/2021 1616 Gross per 24 hour  Intake 2736.12 ml  Output 2452 ml  Net 284.12 ml   Filed Weights   01/11/21 0622 01/12/21 0543 01/13/21 0354  Weight: 75.7 kg 71 kg 75.8 kg    Examination: General appearance: adult female, lying in bed, no acute distress, appears weak and chronically ill  HEENT: Anicteric, conjunctiva pale, lids and lashes normal. No nasal deformity, discharge, epistaxis.  Lips dry and cracked, some glossitis.   Skin: Pale, no rashes. Cardiac: Tachycardic, regular, 2-3+ dependent edema, everywhere, DP pulses good, foot cap refill good, feet warm  Respiratory: Normal respiratory rate and  rhythm.  CTAB without rales or wheezes. Abdomen: Abdomen soft.  Gravid.  No TTP.   MSK: No deformities or effusions. The right knee appears normal Neuro: Awake and alert, oriented.  Moves all extremities with severe generalized weakness.  No clonus, no hyperreflexia.  She has been too eak to stand and test gait. Psych: Sensorium intact and responding to questions, eye contact diminished, mild psychomotor slowing, affect flat.          Data Reviewed: I have personally reviewed following labs and imaging studies:  CBC: Recent Labs  Lab 01/10/21 1310 01/11/21 0218 01/11/21 0524 01/12/21 0408 01/13/21 0509  WBC 4.2  --  5.1 5.0 5.0  NEUTROABS 2.9  --   --   --   --   HGB 6.9* 9.2* 9.1* 8.9* 8.2*  HCT 21.9* 28.3* 27.7* 27.2* 26.1*  MCV 95.2  --  92.6 94.4 95.6  PLT 185  --  186 185 207   Basic Metabolic Panel: Recent Labs  Lab 01/08/21 0320 01/09/21 0544 01/10/21 0610 01/11/21 0759 01/12/21 0408 01/13/21 0509 01/13/21 0759  NA 141 139 135 139  --  135  --   K 5.0 4.9 5.0 5.0  --  4.6  --   CL 111 111 109 111  --  109  --   CO2 22 21* 20* 21*  --  21*  --   GLUCOSE 97 111* 91  91 101*  --  109*  --   BUN 34* 36* 39* 39*  --  35*  --   CREATININE 1.34* 1.38* 1.47* 1.39* 1.44* 1.34*  --   CALCIUM 8.4* 8.1* 8.2* 8.2*  --  8.3*  --   MG  --   --   --   --   --  10.2* 3.9*  PHOS  --   --  5.9* 5.6*  --   --   --    GFR: Estimated Creatinine Clearance: 57.7 mL/min (A) (by C-G formula based on SCr of 1.34 mg/dL (H)). Liver Function Tests: Recent Labs  Lab 01/10/21 0610 01/11/21 0759 01/13/21 0509  AST  --   --  30  ALT  --   --  19  ALKPHOS  --   --  156*  BILITOT  --   --  1.8*  PROT  --   --  4.9*  ALBUMIN 2.8* 2.4* 2.2*   No results for input(s): LIPASE, AMYLASE in the last 168 hours. No results for input(s): AMMONIA in the last 168 hours. Coagulation Profile: Recent Labs  Lab 01/09/21 1642 01/10/21 1648  INR 1.0 0.9   Cardiac Enzymes: No results for  input(s): CKTOTAL, CKMB, CKMBINDEX, TROPONINI in the last 168 hours. BNP (last 3 results) No results for input(s): PROBNP in the last 8760 hours. HbA1C: No results for input(s): HGBA1C in the last 72 hours. CBG: Recent Labs  Lab 01/13/21 1141 01/13/21 1252 01/13/21 1347 01/13/21 1446 01/13/21 1648  GLUCAP 130* 106* 109* 103* 121*   Lipid Profile: No results for input(s): CHOL, HDL, LDLCALC, TRIG, CHOLHDL, LDLDIRECT in the last 72 hours. Thyroid Function Tests: No results for input(s): TSH, T4TOTAL, FREET4, T3FREE, THYROIDAB in the last 72 hours. Anemia Panel: No results for input(s): VITAMINB12, FOLATE, FERRITIN, TIBC, IRON, RETICCTPCT in the last 72 hours. Urine analysis:    Component Value Date/Time   COLORURINE AMBER (A) 01/10/2021 0131   APPEARANCEUR CLOUDY (A) 01/10/2021 0131   APPEARANCEUR Clear 09/18/2020 1153   LABSPEC 1.017 01/10/2021 0131   LABSPEC 1.026 03/01/2013 1844   PHURINE 5.0 01/10/2021 0131   GLUCOSEU NEGATIVE 01/10/2021 0131   GLUCOSEU >=500 03/01/2013 1844   HGBUR NEGATIVE 01/10/2021 0131   BILIRUBINUR NEGATIVE 01/10/2021 0131   BILIRUBINUR neg 10/23/2020 0953   BILIRUBINUR Negative 09/18/2020 1153   BILIRUBINUR Negative 03/01/2013 1844   KETONESUR NEGATIVE 01/10/2021 0131   PROTEINUR NEGATIVE 01/10/2021 0131   UROBILINOGEN 0.2 10/23/2020 0953   NITRITE NEGATIVE 01/10/2021 0131   LEUKOCYTESUR SMALL (A) 01/10/2021 0131   LEUKOCYTESUR Negative 03/01/2013 1844   Sepsis Labs: @LABRCNTIP (procalcitonin:4,lacticacidven:4)  ) Recent Results (from the past 240 hour(s))  OB Urine Culture     Status: Abnormal   Collection Time: 01/05/21  2:32 PM   Specimen: Urine, Random  Result Value Ref Range Status   Specimen Description URINE, RANDOM  Final   Special Requests NONE  Final   Culture (A)  Final    >=100,000 COLONIES/mL ENTEROCOCCUS FAECALIS NO GROUP B STREP (S.AGALACTIAE) ISOLATED Performed at [facility], [address] [place],  [place]    Report Status 01/07/2021 FINAL  Final   Organism ID, Bacteria ENTEROCOCCUS FAECALIS (A)  Final      Susceptibility   Enterococcus faecalis - MIC*    AMPICILLIN <=2 SENSITIVE Sensitive     NITROFURANTOIN <=16 SENSITIVE Sensitive     VANCOMYCIN 2 SENSITIVE Sensitive     * >=100,000 COLONIES/mL ENTEROCOCCUS FAECALIS  C Difficile Quick Screen (NO PCR Reflex)     Status: None   Collection Time: 01/12/21  9:07 PM   Specimen: STOOL  Result Value Ref Range Status   C Diff antigen NEGATIVE NEGATIVE Final   C Diff toxin NEGATIVE NEGATIVE Final   C Diff interpretation No C. difficile detected.  Final    Comment: Performed at [facility], [address], [place], [place]         Radiology Studies: MR LUMBAR SPINE WO CONTRAST  Result Date: 01/12/2021 CLINICAL DATA:  Initial evaluation for possible cauda equina syndrome, currently pregnant. EXAM: MRI LUMBAR SPINE WITHOUT CONTRAST TECHNIQUE: Multiplanar, multisequence MR imaging of the lumbar spine was performed.  No intravenous contrast was administered. COMPARISON:  None available. FINDINGS: Segmentation: Standard. Lowest well-formed disc space labeled the L5-S1 level. Alignment: Physiologic with preservation of the normal lumbar lordosis. No listhesis. Vertebrae: Vertebral body height maintained without acute or chronic fracture. Bone marrow signal intensity diffusely decreased on T1 weighted imaging, most likely related to current pregnancy status. No discrete or worrisome osseous lesions. No abnormal marrow edema. Conus medullaris and cauda equina: Conus extends to the L1 level. Conus and cauda equina appear normal. No evidence for compression of the distal conus or nerve roots of the cauda equina. Paraspinal and other soft tissues: Prominent diffuse edema seen throughout the posterior subcutaneous soft tissues, consistent with anasarca. Enlarged gravid uterus partially visualized. Moderate right with mild left  hydroureteronephrosis, most likely related to the gravid uterus. Disc levels: No significant disc pathology seen within the lumbar spine. Intervertebral discs are well hydrated with preserved disc height. No disc bulge or focal disc herniation. No significant facet disease. No canal or neural foraminal stenosis or evidence for neural impingement. IMPRESSION: 1. Normal MRI of the lumbar spine. No evidence for compression of the distal conus or nerve roots of the cauda equina. No significant disc pathology, stenosis, or evidence for neural impingement. 2. Enlarged gravid uterus with secondary moderate right and mild left hydroureteronephrosis. 3. Diffuse edema/anasarca within the visualized peripheral soft tissues. Electronically Signed   By: Rise Mu M.D.   On: 01/12/2021 23:22   [place] MFM OB FOLLOW UP  Result Date: 01/12/2021 ----------------------------------------------------------------------  OBSTETRICS REPORT                       (Signed Final 01/12/2021 04:53 pm) ---------------------------------------------------------------------- Patient Info  ID #:       [id number]                          D.O.B.:  1990/10/31 (29 yrs)  Name:       Ashley Camacho [facility]                 Visit Date: 01/12/2021 09:48 am ---------------------------------------------------------------------- Performed By  Attending:        Lin Landsman      Ref. Address:     [facility]                    MD                                                             [facility]                                                             [address]                                                             [address]  [address]                                                             [place]                                                             [address]  Performed By:     Reinaldo Raddle            [facility]  Referred By:      Linzie Collin MD ---------------------------------------------------------------------- Orders  #  Description                           Code        Ordered By  1  [place] MFM OB FOLLOW UP                   96045.40    Milas Hock ----------------------------------------------------------------------  #  Order #                     Accession #                Episode #  1  [id number]                   [id number]                 [id number] ---------------------------------------------------------------------- Indications  Polyhydramnios, second trimester,              O40.2XX0  antepartum condition or complication, fetus  unspecified  Pre-existing diabetes, type 1, in pregnancy,   O24.012  second trimester  Hyperemesis gravidarum                         O21.0  Anemia during pregnancy in second trimester    O99.012  Abnormal fetal ultrasound (? liver             O28.9  calcification)  [redacted] weeks gestation of pregnancy                Z3A.29 ---------------------------------------------------------------------- Fetal Evaluation  Num Of Fetuses:         1  Fetal Heart Rate(bpm):  133  Cardiac Activity:       Observed  Presentation:           Cephalic  Placenta:               Posterior  P. Cord Insertion:      Previously  Visualized  Amniotic Fluid  AFI FV:      Polyhydramnios  AFI Sum(cm)     %Tile       Largest Pocket(cm)  34.2            > 97        12.7  RUQ(cm)       RLQ(cm)       LUQ(cm)        LLQ(cm)  10.4          3.8           7.3            12.7 ---------------------------------------------------------------------- Biometry  BPD:      74.2  mm     G. Age:  29w 5d         63  %    CI:        82.27   %    70 - 86                                                          FL/HC:      19.9   %    19.6 - 20.8  HC:      258.1  mm     G. Age:  28w 0d        4.1  %    HC/AC:      1.02        0.99 - 1.21  AC:      253.9  mm     G. Age:  29w 4d         62  %     FL/BPD:     69.1   %    71 - 87  FL:       51.3  mm     G. Age:  27w 3d          5  %    FL/AC:      20.2   %    20 - 24  Est. FW:    1277  gm    2 lb 13 oz      28  % ---------------------------------------------------------------------- Gestational Age  U/S Today:     28w 5d                                        EDD:   04/01/21  Best:          29w 0d     Det. ByMarcella Dubs         EDD:   03/30/21                                      (08/27/20) ---------------------------------------------------------------------- Anatomy  Cranium:               Appears normal         Aortic Arch:            Previously seen  Cavum:                 Previously seen  Ductal Arch:            Previously seen  Ventricles:            Appears normal         Diaphragm:              Appears normal  Choroid Plexus:        Previously seen        Stomach:                Appears normal, left                                                                        sided  Cerebellum:            Previously seen        Abdomen:                Previously seen  Posterior Fossa:       Previously seen        Abdominal Wall:         Previously seen  Nuchal Fold:           Previously seen        Cord Vessels:           Previously seen  Lips:                  Previously seen        Kidneys:                Appear normal  Thoracic:              Appears normal         Bladder:                Appears normal  Heart:                 Previously seen        Spine:                  Previously seen  RVOT:                  Previously seen        Upper Extremities:      Previously seen  LVOT:                  Previously seen        Lower Extremities:      Previously seen  Other:  Technically difficult due to fetal position. ---------------------------------------------------------------------- Cervix Uterus Adnexa  Cervix  Not visualized (advanced GA >24wks) ---------------------------------------------------------------------- Impression  Follow up  growth given poorly controlled DM with  gastroparesis and NG tube feeding.  Ms. Woolford remains hospitalized with chronic nausea and  vomiting.  Normal interval growth with measurements consistent with  dates  Good fetal movement and moderate-severe polyhydramnios  I discussed the plan of care with Dr. Maryfrances Bunnell. We discussed  continued management of blood sugars with insulin drip and  have agreement with advancing her diet with the goal of  removing the NG tube.  Continue weekly testing with repeat growth in 3 weeks.  Will aim for possible  delivery at 34 weeks, depending on her  status and improvement. ---------------------------------------------------------------------- Recommendations  Continue weekly testing  Repeat growth in 3-4 weeks. ----------------------------------------------------------------------               Lin Landsman, MD Electronically Signed Final Report   01/12/2021 04:53 pm ----------------------------------------------------------------------       Scheduled Meds:  sodium chloride   Intravenous Once   vitamin C  500 mg Oral Daily   betamethasone acetate-betamethasone sodium phosphate  12 mg Intramuscular Q24 Hr x 2   Chlorhexidine Gluconate Cloth  6 each Topical Daily   cholecalciferol  1,000 Units Oral Daily   feeding supplement (PROSource TF)  45 mL Per Tube BID   folic acid  1 mg Per Tube Daily   furosemide  20 mg Intravenous BID   mouth rinse  15 mL Mouth Rinse q12n4p   metoCLOPramide (REGLAN) injection  5 mg Intravenous Q6H   ondansetron (ZOFRAN) IV  4 mg Intravenous Q8H   pantoprazole (PROTONIX) IV  40 mg Intravenous Q12H   prenatal vitamin w/FE, FA  1 tablet Oral Q1200   sodium chloride flush  10-40 mL Intracatheter Q12H   sodium chloride flush  3 mL Intravenous Q12H   thiamine  100 mg Per Tube Daily   vitamin B-6  12.5 mg Oral QHS   Continuous Infusions:  sodium chloride Stopped (01/11/21 2000)   feeding supplement (OSMOLITE 1.5 CAL) 1,000 mL (01/13/21  1220)   insulin 1.4 Units/hr (01/13/21 1645)   magnesium sulfate Stopped (01/13/21 0622)   promethazine (PHENERGAN)25mg  in 50mL NS 25 mg (01/08/21 2252)     LOS: 28 days    Time spent: 45 minutes    Alberteen Sam, MD [facility] 01/13/2021, 5:28 PM     Please page though AMION or Epic secure chat:  For [employer], Higher education careers adviser

## 2021-01-13 NOTE — Consult Note (Signed)
Referring Provider:  [facility]         Primary Care Physician:  Hildred Laser, MD Primary Gastroenterologist:  unassigned  Reason for Consultation:   nausea and vomiting                ASSESSMENT / PLAN    # Complicated 30 year old female with Type I diabetes who is 29w gestation. Admitted with nausea / vomiting / malnutrition requiring enteral feedings. She has had other admissions during the pregnancy for nausea/ vomiting and multiple other issues not limited to AKI / cardiomyopathy, PNA, DVT, and anemia.  --Etiology of nausea / vomiting unclear. Psychiatric evaluation during recent [facility] admission was negative for mood disorder / eating disorder. She describes intermittent dark / black emesis throughout this whole pregnancy but no melena or even dark stools. However, she has required several units of blood over the last two months. She could have PUD or more likely esophagitis from vomiting --Would continue the IV Reglan as symptoms may be worse without it.  Interestingly she vomits "gastric juice' ( or dark / black liquid) rather than enteral feeding which she is receiving at 50 ml / hr.  --We talked about an EGD to rule out organic disease as cause for the persistent vomiting. We discussed the risks / benefits of the procedure. She is agreeable if we decided to proceed.    # [place] anemia with pregnancy, received blood during [facility] admission in July. Has received 5 additional units during this admission.Malnutrition probably contributing factor, labs are pending. She reports intermittent dark / black emesis throughout this whole pregnancy but no melena.  --Continue BID IV PPI --check for fecal occult blood, especially since she is getting Lovenox    HISTORY OF PRESENT ILLNESS                                                                                                                         Chief Complaint: nausea and vomiting  Ashley Camacho is a 30 y.o. female with a past  medical history significant for Type I diabetes, gastroparesis, polyneuropathy, hypothyroidism, cardiomyopathy  Patient is [redacted] Weeks gestation. Throughout much of this pregnancy she has been hospitalized at [facility], [place] and [facility] for multiple medical issues not limited to persistent nausea, vomiting, malnutrition, AKI, anemia, hypoglycemia, cardiomyopathy, DVT, PNA  Patient was hospitalized [facility] in late June for vomiting.Hospital course was complicated by PNA, AKI, malnutrition, elevated BNP, right brachial vein DVT, vitreous hemorrhage anemia. Marland KitchenShe was transferred to [facility] 11/24/20 and remained there until 12/10/20 for further management. At the time of admission she was [redacted] weeks gestation. She required thoracentesis which was transudative. Culture negative. Viral panel positive for rhinovirus. Respiratory symptoms improved with antibiotics. For aversion to oral intake she had a psychiatric evaluation . No mood disorder / eating disorder diagnosed. She was tried on Remeron, it didn't help. She didn't tolerate an NG tube. By time of discharge she still was not meeting caloric  needs but refused enteral feedings. She wasn't a candidate for TNA without failing tube feeds. Regarding superficial thrombosis in basilic vein she was not anticoagulated due to risk or worsening vitreous hemorrhage. [facility] evaluated her for elevated BNP, edema and SOB. Her EF was 45%. Low serum protein secondary to malnutrition was felt to be contributing factor. Lasix had be started but it was discontinued and plan was for outpatient follow up. Patient received a unit of blood for anemia.  Her AKI was felt to be from poor PO intake and lasix. Cr improved to 1.0 by discharge. She was   Current admission:  Patient readmitted late July with hyperemesis gravidarum with 3rd spacing from malnutrition. She refused small bowel feeding tube, PEG was considered unsafe. She was started on TNA but due to concerns about insurance payment for home  TNA she had a Cortrak placed. She was started on Lovenox for the bright brachial DVT.   We were asked to see patient for nausea / vomiting not felt to be hyperemesis gravidarum.Getting enteral feedings with Cortrak. She doesn't feel hungry and scared of getting nauseated if she eats. She vomited as recently as this am. Describes emesis today as "stomach juices" rather than tube feeds though she is getting continuous Cortrak feedings at 50 ml / hr. She also describes intermittent dark / black emesis throughout this whole pregnancy but NO dark stools. Her stools are chronically on loose side. She has no abdominal pain. No history of PUD. No NSAID use. She gives a history of gastroparesis but says Reglan didn't work. When not pregnant she typically doesn't have problems with N/V. She does give a history of heartburn during pregnancy and has been taking Protonix.      PREVIOUS ENDOSCOPIC EVALUATIONS   none   Past Medical History:  Diagnosis Date   Anemia    Gastroparesis    Hypertension    Type 1 diabetes (HCC)     Past Surgical History:  Procedure Laterality Date   CESAREAN SECTION     MOUTH SURGERY      Prior to Admission medications   Medication Sig Start Date End Date Taking? Authorizing Provider  aluminum-petrolatum-zinc (1-2-3 PASTE) 0.027-13.7-12.5% PSTE paste Apply 1 application topically 3 (three) times daily. 12/16/20  Yes Darlin Priestly, MD  enoxaparin (LOVENOX) 80 MG/0.8ML injection Inject 0.8 mLs (80 mg total) into the skin every 12 (twelve) hours. 12/16/20  Yes Darlin Priestly, MD  famotidine (PEPCID) 20-0.9 MG/50ML-% Inject 50 mLs (20 mg total) into the vein every 12 (twelve) hours. 12/16/20  Yes Darlin Priestly, MD  insulin aspart (NOVOLOG FLEXPEN) 100 UNIT/ML FlexPen Inject 200-350 Units into the skin See admin instructions. 200-250: add 2u. 251-300: add 4u. 301-350: add 8u. > 400: Call MD. 10/30/19  Yes [provider]  mirtazapine (REMERON SOL-TAB) 15 MG disintegrating tablet Take 1  tablet (15 mg total) by mouth at bedtime. 12/16/20  Yes Darlin Priestly, MD  ondansetron [facility]) 4 MG/2ML SOLN injection Inject 2 mLs (4 mg total) into the vein every 8 (eight) hours. 12/16/20  Yes Darlin Priestly, MD  Prenatal Vit-Fe Fumarate-FA (PRENATAL MULTIVITAMIN) TABS tablet Take 1 tablet by mouth daily at 12 noon.   Yes [provider]  prochlorperazine (COMPAZINE) 10 MG/2ML injection Inject 2 mLs (10 mg total) into the vein every 6 (six) hours as needed. Patient taking differently: Inject 10 mg into the vein every 6 (six) hours as needed (nausea / vomiting.). 12/16/20  Yes Darlin Priestly, MD  sodium chloride 0.9 %  SOLN 50 mL with promethazine 25 MG/ML SOLN 25 mg Inject 25 mg into the vein every 6 (six) hours as needed. Patient taking differently: Inject 25 mg into the vein every 6 (six) hours as needed (nausea / vomiting). 12/16/20  Yes Darlin PriestlyLai, Tina, MD  ferrous sulfate 324 MG TBEC Take 324 mg by mouth. Patient not taking: No sig reported    [provider]    Current Facility-Administered Medications  Medication Dose Route Frequency Provider Last Rate Last Admin   0.9 %  sodium chloride infusion (Manually program via Guardrails IV Fluids)   Intravenous Once Warden FillersBass, Lawrence A, MD       0.9 %  sodium chloride infusion  250 mL Intravenous PRN Warden FillersBass, Lawrence A, MD   Stopped at 01/11/21 2000   acetaminophen (TYLENOL) suppository 650 mg  650 mg Rectal Q6H PRN Berton Mountgbata, Sylvester I, MD       acetaminophen (TYLENOL) tablet 650 mg  650 mg Per Tube Q6H PRN Bobette Mortiz, David Manuel, MD   650 mg at 01/13/21 0116   ascorbic acid (VITAMIN C) tablet 500 mg  500 mg Oral Daily Danford, Earl Liteshristopher P, MD       Chlorhexidine Gluconate Cloth 2 % PADS 6 each  6 each Topical Daily Warden FillersBass, Lawrence A, MD   6 each at 01/12/21 1100   cholecalciferol (VITAMIN D3) tablet 1,000 Units  1,000 Units Oral Daily Danford, Earl Liteshristopher P, MD       cyclobenzaprine (FLEXERIL) tablet 5 mg  5 mg Oral TID PRN Reva BoresPratt, Tanya S, MD   5 mg at  01/12/21 1040   dextrose 50 % solution 0-50 mL  0-50 mL Intravenous PRN Warden FillersBass, Lawrence A, MD   30 mL at 01/12/21 1342   dextrose 50 % solution 0-50 mL  0-50 mL Intravenous PRN Alberteen Samanford, Christopher P, MD   35 mL at 01/13/21 0709   diphenhydrAMINE-zinc acetate (BENADRYL) 2-0.1 % cream   Topical Daily PRN Anyanwu, Ugonna A, MD       enoxaparin (LOVENOX) injection 55 mg  55 mg Subcutaneous Q12H Berton Mountgbata, Sylvester I, MD   55 mg at 01/13/21 [id number]   feeding supplement (OSMOLITE 1.5 CAL) liquid 1,000 mL  1,000 mL Per Tube Continuous Warden FillersBass, Lawrence A, MD 50 mL/hr at 01/12/21 1600 Restarted at 01/12/21 1600   feeding supplement (PROSource TF) liquid 45 mL  45 mL Per Tube BID Warden FillersBass, Lawrence A, MD   45 mL at 01/12/21 2305   folic acid (FOLVITE) tablet 1 mg  1 mg Per Tube Daily Adhikari, Willia CrazeAmrit, MD   1 mg at 01/12/21 1015   furosemide (LASIX) injection 20 mg  20 mg Intravenous Daily Sabra HeckSanford, Ryan B, MD   20 mg at 01/12/21 1008   guaiFENesin-dextromethorphan (ROBITUSSIN DM) 100-10 MG/5ML syrup 10 mL  10 mL Per Tube Q6H PRN Warden FillersBass, Lawrence A, MD   10 mL at 12/18/20 2241   hydrocortisone cream 1 %   Topical PRN Anyanwu, Jethro BastosUgonna A, MD   Given at 01/01/21 2241   insulin regular, human (MYXREDLIN) 100 units/ 100 mL infusion   Intravenous Continuous Danford, Earl Liteshristopher P, MD 1.2 mL/hr at 01/13/21 0500 1.2 Units/hr at 01/13/21 0500   lactated ringers infusion   Intravenous Continuous Elgergawy, Leana Roeawood S, MD 10 mL/hr at 01/12/21 0628 New Bag at 01/12/21 [id number]   lactated ringers infusion   Intravenous Continuous Milas Hockuncan, Kealii Thueson, MD   Stopped at 01/13/21 0622   loperamide (IMODIUM) capsule 2 mg  2 mg Oral PRN Elgergawy, Mliss Fritzawood  S, MD       magnesium sulfate 40 grams in SWI 1000 mL OB infusion  1 g/hr Intravenous Titrated Milas Hock, MD   Stopped at 01/13/21 0622   MEDLINE mouth rinse  15 mL Mouth Rinse q12n4p Elgergawy, Leana Roe, MD   15 mL at 01/12/21 2319   metoCLOPramide (REGLAN) injection 10 mg  10 mg Intravenous Q6H PRN  Mansy, Jan A, MD       metoCLOPramide (REGLAN) injection 5 mg  5 mg Intravenous Q6H Warden Fillers, MD   5 mg at 01/13/21 0602   ondansetron [facility]) injection 4 mg  4 mg Intravenous Q4H PRN Mansy, Jan A, MD       oxyCODONE (Oxy IR/ROXICODONE) immediate release tablet 5 mg  5 mg Oral Q6H PRN Alberteen Sam, MD   5 mg at 01/13/21 0743   pantoprazole (PROTONIX) injection 40 mg  40 mg Intravenous Q12H Burnadette Pop, MD   40 mg at 01/12/21 2319   phenol (CHLORASEPTIC) mouth spray 1 spray  1 spray Mouth/Throat PRN Elgergawy, Leana Roe, MD   1 spray at 12/19/20 0002   prenatal vitamin w/FE, FA (NATACHEW) chewable tablet 1 tablet  1 tablet Oral Q1200 Albertine Grates, MD   1 tablet at 01/12/21 1224   prochlorperazine (COMPAZINE) injection 10 mg  10 mg Intravenous Q6H PRN Warden Fillers, MD   10 mg at 12/25/20 1611   promethazine (PHENERGAN) 25 mg in sodium chloride 0.9 % 50 mL IVPB  25 mg Intravenous Q6H PRN Warden Fillers, MD 200 mL/hr at 01/08/21 2252 25 mg at 01/08/21 2252   sodium chloride (OCEAN) 0.65 % nasal spray 1 spray  1 spray Each Nare PRN Milas Hock, MD   1 spray at 01/10/21 5277   sodium chloride flush (NS) 0.9 % injection 10-40 mL  10-40 mL Intracatheter Q12H Warden Fillers, MD   10 mL at 01/12/21 2302   sodium chloride flush (NS) 0.9 % injection 10-40 mL  10-40 mL Intracatheter PRN Warden Fillers, MD   10 mL at 01/08/21 2122   sodium chloride flush (NS) 0.9 % injection 3 mL  3 mL Intravenous Q12H Warden Fillers, MD   3 mL at 01/12/21 2302   sodium chloride flush (NS) 0.9 % injection 3 mL  3 mL Intravenous PRN Warden Fillers, MD   3 mL at 01/07/21 1020   thiamine tablet 100 mg  100 mg Per Tube Daily Burnadette Pop, MD   100 mg at 01/12/21 1015   vitamin B-6 (pyridOXINE) tablet 12.5 mg  12.5 mg Oral QHS Elgergawy, Leana Roe, MD   12.5 mg at 01/12/21 2301    Allergies as of 12/16/2020   (No Known Allergies)    Family History  Problem Relation Age of Onset   Diabetes  type I Father    CAD Father    CAD Paternal Grandmother    CAD Paternal Grandfather    Breast cancer Mother    Ovarian cancer Neg Hx     Social History   Socioeconomic History   Marital status: Single    Spouse name: Not on file   Number of children: Not on file   Years of education: Not on file   Highest education level: Not on file  Occupational History   Occupation: home maker  Tobacco Use   Smoking status: Never   Smokeless tobacco: Never  Vaping Use   Vaping Use: Never used  Substance and Sexual Activity  Alcohol use: No   Drug use: No   Sexual activity: Yes    Birth control/protection: None  Other Topics Concern   Not on file  Social History Narrative   Not on file   Social Determinants of Health   Financial Resource Strain: Not on file  Food Insecurity: Not on file  Transportation Needs: Not on file  Physical Activity: Not on file  Stress: Not on file  Social Connections: Not on file  Intimate Partner Violence: Not on file    Review of Systems: All systems reviewed and negative except where noted in HPI.     OBJECTIVE    Physical Exam: Vital signs in last 24 hours: Temp:  [97.1 F (36.2 C)-98 F (36.7 C)] 97.8 F (36.6 C) (08/23 0700) Pulse Rate:  [81-90] 83 (08/23 0700) Resp:  [16-19] 18 (08/23 0700) BP: (107-128)/(44-65) 119/54 (08/23 0700) SpO2:  [94 %-98 %] 94 % (08/23 0700) Weight:  [75.8 kg] 75.8 kg (08/23 0354) Last BM Date: 01/12/21 General:   Alert  female in NAD Psych: Cooperative. Flat affect Eyes:  Pupils equal, sclera clear, no icterus.   Conjunctiva pink. Ears:  Normal auditory acuity. Nose:  No deformity, discharge,  or lesions. Neck:  Supple; no masses Lungs:  Clear throughout to auscultation.   No wheezes, crackles, or rhonchi.  Heart:  Regular rate and rhythm;  no lower extremity edema Abdomen:  Soft, distended ( pregnancy), nontender, BS active, no palp mass   Rectal:  Deferred  Msk:  Symmetrical without gross  deformities. . Neurologic:  Alert and  oriented x4;  grossly normal neurologically. Skin:  Intact without significant lesions or rashes.   Scheduled inpatient medications  sodium chloride   Intravenous Once   vitamin C  500 mg Oral Daily   Chlorhexidine Gluconate Cloth  6 each Topical Daily   cholecalciferol  1,000 Units Oral Daily   enoxaparin  55 mg Subcutaneous Q12H   feeding supplement (PROSource TF)  45 mL Per Tube BID   folic acid  1 mg Per Tube Daily   furosemide  20 mg Intravenous Daily   mouth rinse  15 mL Mouth Rinse q12n4p   metoCLOPramide (REGLAN) injection  5 mg Intravenous Q6H   pantoprazole (PROTONIX) IV  40 mg Intravenous Q12H   prenatal vitamin w/FE, FA  1 tablet Oral Q1200   sodium chloride flush  10-40 mL Intracatheter Q12H   sodium chloride flush  3 mL Intravenous Q12H   thiamine  100 mg Per Tube Daily   vitamin B-6  12.5 mg Oral QHS      Intake/Output from previous day: 08/22 0701 - 08/23 0700 In: 2665.9 [P.O.:85; I.V.:1605.9; NG/GT:975] Out: 2200 [Urine:2100; Emesis/NG output:100] Intake/Output this shift: Total I/O In: -  Out: 200 [Urine:200]   Lab Results: Recent Labs    01/11/21 0524 01/12/21 0408 01/13/21 0509  WBC 5.1 5.0 5.0  HGB 9.1* 8.9* 8.2*  HCT 27.7* 27.2* 26.1*  PLT 186 185 207   BMET Recent Labs    01/11/21 0759 01/12/21 0408 01/13/21 0509  NA 139  --  135  K 5.0  --  4.6  CL 111  --  109  CO2 21*  --  21*  GLUCOSE 101*  --  109*  BUN 39*  --  35*  CREATININE 1.39* 1.44* 1.34*  CALCIUM 8.2*  --  8.3*   LFT Recent Labs    01/13/21 0509  PROT 4.9*  ALBUMIN 2.2*  AST 30  ALT  19  ALKPHOS 156*  BILITOT 1.8*   PT/INR Recent Labs    01/10/21 1648  LABPROT 12.3  INR 0.9   Hepatitis Panel No results for input(s): HEPBSAG, HCVAB, HEPAIGM, HEPBIGM in the last 72 hours.   . CBC Latest Ref Rng & Units 01/13/2021 01/12/2021 01/11/2021  WBC 4.0 - 10.5 K/uL 5.0 5.0 5.1  Hemoglobin 12.0 - 15.0 g/dL 8.2(L) 8.9(L)  9.1(L)  Hematocrit 36.0 - 46.0 % 26.1(L) 27.2(L) 27.7(L)  Platelets 150 - 400 K/uL 207 185 186    . CMP Latest Ref Rng & Units 01/13/2021 01/12/2021 01/11/2021  Glucose 70 - 99 mg/dL 696(E) - 952(W)  BUN 6 - 20 mg/dL 41(L) - 24(M)  Creatinine 0.44 - 1.00 mg/dL 0.10(U) 7.25(D) 6.64(Q)  Sodium 135 - 145 mmol/L 135 - 139  Potassium 3.5 - 5.1 mmol/L 4.6 - 5.0  Chloride 98 - 111 mmol/L 109 - 111  CO2 22 - 32 mmol/L 21(L) - 21(L)  Calcium 8.9 - 10.3 mg/dL 8.3(L) - 8.2(L)  Total Protein 6.5 - 8.1 g/dL 4.9(L) - -  Total Bilirubin 0.3 - 1.2 mg/dL 0.3(K) - -  Alkaline Phos 38 - 126 U/L 156(H) - -  AST 15 - 41 U/L 30 - -  ALT 0 - 44 U/L 19 - -   Studies/Results: MR LUMBAR SPINE WO CONTRAST  Result Date: 01/12/2021 CLINICAL DATA:  Initial evaluation for possible cauda equina syndrome, currently pregnant. EXAM: MRI LUMBAR SPINE WITHOUT CONTRAST TECHNIQUE: Multiplanar, multisequence MR imaging of the lumbar spine was performed. No intravenous contrast was administered. COMPARISON:  None available. FINDINGS: Segmentation: Standard. Lowest well-formed disc space labeled the L5-S1 level. Alignment: Physiologic with preservation of the normal lumbar lordosis. No listhesis. Vertebrae: Vertebral body height maintained without acute or chronic fracture. Bone marrow signal intensity diffusely decreased on T1 weighted imaging, most likely related to current pregnancy status. No discrete or worrisome osseous lesions. No abnormal marrow edema. Conus medullaris and cauda equina: Conus extends to the L1 level. Conus and cauda equina appear normal. No evidence for compression of the distal conus or nerve roots of the cauda equina. Paraspinal and other soft tissues: Prominent diffuse edema seen throughout the posterior subcutaneous soft tissues, consistent with anasarca. Enlarged gravid uterus partially visualized. Moderate right with mild left hydroureteronephrosis, most likely related to the gravid uterus. Disc levels: No  significant disc pathology seen within the lumbar spine. Intervertebral discs are well hydrated with preserved disc height. No disc bulge or focal disc herniation. No significant facet disease. No canal or neural foraminal stenosis or evidence for neural impingement. IMPRESSION: 1. Normal MRI of the lumbar spine. No evidence for compression of the distal conus or nerve roots of the cauda equina. No significant disc pathology, stenosis, or evidence for neural impingement. 2. Enlarged gravid uterus with secondary moderate right and mild left hydroureteronephrosis. 3. Diffuse edema/anasarca within the visualized peripheral soft tissues. Electronically Signed   By: Rise Mu M.D.   On: 01/12/2021 23:22   [place] MFM OB FOLLOW UP  Result Date: 01/12/2021 ----------------------------------------------------------------------  OBSTETRICS REPORT                       (Signed Final 01/12/2021 04:53 pm) ---------------------------------------------------------------------- Patient Info  ID #:       [id number]                          D.O.B.:  1990-06-28 (29 yrs)  Name:  Ashley Camacho                 Visit Date: 01/12/2021 09:48 am ---------------------------------------------------------------------- Performed By  Attending:        Lin Landsman      Ref. Address:     [facility]                    MD                                                             [facility]                                                             [address]                                                             [address]                                                             [address]                                                             [place]                                                             [address]  Performed By:     Reinaldo Raddle            [facility]  Referred By:      Linzie Collin MD ---------------------------------------------------------------------- Orders  #  Description                           Code  Ordered By  1  [place] MFM OB FOLLOW UP                   [id number]    Milas Hock ----------------------------------------------------------------------  #  Order #                     Accession #                Episode #  1  [id number]                   [id number]                 [id number] ---------------------------------------------------------------------- Indications  Polyhydramnios, second trimester,              O40.2XX0  antepartum condition or complication, fetus  unspecified  Pre-existing diabetes, type 1, in pregnancy,   O24.012  second trimester  Hyperemesis gravidarum                         O21.0  Anemia during pregnancy in second trimester    O99.012  Abnormal fetal ultrasound (? liver             O28.9  calcification)  [redacted] weeks gestation of pregnancy                Z3A.29 ---------------------------------------------------------------------- Fetal Evaluation  Num Of Fetuses:         1  Fetal Heart Rate(bpm):  133  Cardiac Activity:       Observed  Presentation:           Cephalic  Placenta:               Posterior  P. Cord Insertion:      Previously Visualized  Amniotic Fluid  AFI FV:      Polyhydramnios  AFI Sum(cm)     %Tile       Largest Pocket(cm)  34.2            > 97        12.7  RUQ(cm)       RLQ(cm)       LUQ(cm)        LLQ(cm)  10.4          3.8           7.3            12.7 ---------------------------------------------------------------------- Biometry  BPD:      74.2  mm     G. Age:  29w 5d         63  %    CI:        82.27   %    70 - 86                                                          FL/HC:      19.9   %    19.6 - 20.8  HC:      258.1  mm     G. Age:  28w 0d        4.1  %    HC/AC:      1.02        0.99 - 1.21  AC:      253.9  mm     G. Age:  29w 4d         62  %    FL/BPD:     69.1   %    71 - 87  FL:       51.3  mm     G. Age:  27w 3d           5  %    FL/AC:      20.2   %    20 - 24  Est. FW:    1277  gm    2 lb 13 oz      28  % ---------------------------------------------------------------------- Gestational Age  U/S Today:     28w 5d                                        EDD:   04/01/21  Best:          29w 0d     Det. ByMarcella Dubs         EDD:   03/30/21                                      (08/27/20) ---------------------------------------------------------------------- Anatomy  Cranium:               Appears normal         Aortic Arch:            Previously seen  Cavum:                 Previously seen        Ductal Arch:            Previously seen  Ventricles:            Appears normal         Diaphragm:              Appears normal  Choroid Plexus:        Previously seen        Stomach:                Appears normal, left                                                                        sided  Cerebellum:            Previously seen        Abdomen:                Previously seen  Posterior Fossa:       Previously seen        Abdominal Wall:         Previously seen  Nuchal Fold:           Previously seen        Cord Vessels:           Previously seen  Lips:  Previously seen        Kidneys:                Appear normal  Thoracic:              Appears normal         Bladder:                Appears normal  Heart:                 Previously seen        Spine:                  Previously seen  RVOT:                  Previously seen        Upper Extremities:      Previously seen  LVOT:                  Previously seen        Lower Extremities:      Previously seen  Other:  Technically difficult due to fetal position. ---------------------------------------------------------------------- Cervix Uterus Adnexa  Cervix  Not visualized (advanced GA >24wks) ---------------------------------------------------------------------- Impression  Follow up growth given poorly controlled DM with  gastroparesis and NG tube feeding.  Ms.  Waage remains hospitalized with chronic nausea and  vomiting.  Normal interval growth with measurements consistent with  dates  Good fetal movement and moderate-severe polyhydramnios  I discussed the plan of care with Dr. Maryfrances Bunnell. We discussed  continued management of blood sugars with insulin drip and  have agreement with advancing her diet with the goal of  removing the NG tube.  Continue weekly testing with repeat growth in 3 weeks.  Will aim for possible delivery at 34 weeks, depending on her  status and improvement. ---------------------------------------------------------------------- Recommendations  Continue weekly testing  Repeat growth in 3-4 weeks. ----------------------------------------------------------------------               Lin Landsman, MD Electronically Signed Final Report   01/12/2021 04:53 pm ----------------------------------------------------------------------   Active Problems:   Delivery with history of C-section   Anemia affecting pregnancy in second trimester   Acute kidney injury (HCC)   Hyperemesis   Pregnancy with 24 completed weeks gestation   DKA, type 1, not at goal [facility])   Starvation ketoacidosis   Acute deep vein thrombosis (DVT) of brachial vein of right upper extremity (HCC)   Acute respiratory failure with hypoxia (HCC)   Vitreous hemorrhage of right eye (HCC)   Gastroparesis due to DM (HCC)   Diabetic retinopathy (HCC)   Acute on chronic combined systolic and diastolic CHF (congestive heart failure) (HCC)   Type 1 diabetes mellitus affecting pregnancy in second trimester, antepartum   HFrEF (heart failure with reduced ejection fraction) (HCC)   [redacted] weeks gestation of pregnancy   Acute respiratory distress   Anemia during pregnancy   Pre-existing type 1 diabetes mellitus during pregnancy in second trimester   Pregnancy with 28 completed weeks gestation   History of preterm delivery, currently pregnant   Leg edema   Nasogastric tube  present    Willette Cluster, NP-C @  01/13/2021, 8:58 AM

## 2021-01-13 NOTE — Progress Notes (Signed)
FACULTY PRACTICE ANTEPARTUM(COMPREHENSIVE) NOTE  Ashley Camacho is a 30 y.o. G2P0101 with Estimated Date of Delivery: 03/30/21   By  LMP [redacted]w[redacted]d  who is admitted for nausea/vomiting, Type 1DM and associated complications, anemia and malnutrition in pregnancy.    Fetal presentation is cephalic. Length of Stay:  28  Days  Date of admission:12/16/2020  Subjective: Pt dozing this am, states she feels about the same.  Still having right leg pain, improved with the stronger medication.  Did have emesis overnight.  No headache, no blurry vision, no RUQ pain.  Foley in place as she was not able to void. Patient reports the fetal movement as  minimal . Patient reports uterine contraction  activity as none. Patient reports  vaginal bleeding as none. Patient describes fluid per vagina as None.  Vitals:  Blood pressure (!) 119/54, pulse 83, temperature 97.8 F (36.6 C), temperature source Axillary, resp. rate 18, height 5\' 1"  (1.549 m), weight 75.8 kg, SpO2 94 %. Vitals:   01/13/21 0400 01/13/21 0500 01/13/21 0600 01/13/21 0700  BP: (!) 119/58 (!) 111/57 (!) 112/44 (!) 119/54  Pulse: 83 81 82 83  Resp: 17 19 19 18   Temp:   (!) 97.1 F (36.2 C) 97.8 F (36.6 C)  TempSrc:   Axillary Axillary  SpO2:    94%  Weight:      Height:       Physical Examination:  General appearance - ill-appearing Mental status - alert and oriented Chest - decreased breath sounds in bases, no wheezes or crackles Heart - normal rate and regular rhythm Abdomen - gravid, soft and non-tender Extremities - 3+ edema extending up to her abdomen/back GU: foley in place Skin - warm and dry  Fetal Monitoring:  Baseline: 130 bpm, Variability: moderate, Accelerations: +10x10 accels, and Decelerations: Absent   appropriate for current gestational ag  Medications:  Scheduled  sodium chloride   Intravenous Once   vitamin C  500 mg Oral Daily   betamethasone acetate-betamethasone sodium phosphate  12 mg Intramuscular Q24 Hr x 2    Chlorhexidine Gluconate Cloth  6 each Topical Daily   cholecalciferol  1,000 Units Oral Daily   enoxaparin  55 mg Subcutaneous Q12H   feeding supplement (PROSource TF)  45 mL Per Tube BID   folic acid  1 mg Per Tube Daily   furosemide  20 mg Intravenous Daily   mouth rinse  15 mL Mouth Rinse q12n4p   metoCLOPramide (REGLAN) injection  5 mg Intravenous Q6H   ondansetron (ZOFRAN) IV  4 mg Intravenous Q8H   pantoprazole (PROTONIX) IV  40 mg Intravenous Q12H   prenatal vitamin w/FE, FA  1 tablet Oral Q1200   sodium chloride flush  10-40 mL Intracatheter Q12H   sodium chloride flush  3 mL Intravenous Q12H   thiamine  100 mg Per Tube Daily   vitamin B-6  12.5 mg Oral QHS   I have reviewed the patient's current medications.  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)  - Diabetes type 1  - Anemia in pregnancy   -AKI (acute kidney injury) (HCC)  - Recent DVT) of brachial vein of right upper extremity (HCC)  - Acute on chronic combined systolic and diastolic CHF (congestive heart failure) (HCC) - [redacted] weeks gestation of pregnancy - Urinary retention - Diarrhea/fecal incontinence  PLAN 1) OB care -FWB- appropriate for gestational age -continue BPP weekly -Growth appropriate on  8/22 -Care plan reviewed with MFM- Dr. Grace Bushy, plan to round on her today -Concern that delivery may need to occur prior to 34wks to better evaluate/manage her multiple acute concerns -plan for BMZ x 2 due to potential for earlier delivery  2) GI concern Nausea/vomiting -currently on reglan , protonix, scop patch -adding scheduled zofran -GI consult today- as medicine has suggested, pt keeping down nutritional support yet still vomiting- etiology seems unclear  Diarrhea -C.diff pending -etiology unclear, possibly medication related  3) FEN -feeding tube clogged yesterday, doing better now -continue vitamin  supplementation  -will review with medicine team long term nutritional goals  4) Type 1 DM -on endotool  5) Nephrology -currently on Lasix 20mg  daily -Cr overall stable -seen by nephrology today, ultimately has signed off at this time -s/p Amox for UTI -Foley in place, will leave for now, continue strict I/Os  6) Heme -Anemia             Hgb remains above 8, continue CBC daily             Iron supplementation daily  Concern as to etiology other than pregnancy- appreciated GI input and work up per IM              -h/o right brachial VTE             Lovenox decreased to 55u BID             Recheck anti-Xa this evening, following 3rd new Lovenox dose             Plan to recheck on Thurs or as clinically indicated  7) Cardio -HTN- on hydralazine IV q8rh, BP stable -s/p cardiology consult, EF stable  8) Right leg pain -on gabapentin q8 hr -Flexeril, tylenol prn -LE doppler were neg on 8/19 -remains stable   DISPO: Continue management as outlined above, care plan reviewed with MFM and IM- should her co-morbidities require further management/assessment, may consider moving forward with delivery.  In preparation for this, BMZ ordered.   9/19 Ashley Camacho 01/13/2021,11:06 AM

## 2021-01-13 NOTE — Hospital Course (Addendum)
  ANTEPARTUM COURSE: Transfer of care from [facility] due to multiple medical comorbidities.  Nausea/vomiting: -Managed with IV anti-emetics -Cortrak placed 7/27 -Fecal occult test positive- work up delayed due to pregnancy -8/23- GI consult -IV protonix, pepcid -8/24- EGD completed, acute esophagitis  ENDO Type 1 Diabetes -managed with IV insulin/endo tool  Adrenal insuff. 8/26- diagnosed with adrenal insufficiency  -started on oral steroids  Cardiology -chronic combined CHF -ECHO completed 8/7- EF 50-55% -chronic HTN- on Hydralazine  Heme -Anemia requiring transfusion 7/27- 1upRBC, 7/30- 1upRBC, 8/5- 1upRBC, 8/20-2upRBC -low B12 and iron, both replaced  H/o Right upper extremity DVT Initially Lovenox 70mg  bid 8/20- supra-therapeutic with active bleeding, anti-Xa levels followed, decreased to 55mg  bid  Nephrology/GU -s/p nephrology consult -treated with Lasix -Cr function 1.2-1.4 -UTI- treated with Amox x 7 days  OB care -BPP weekly -Growth q 4wks, last growth 8/22 -BMZ 8/23-8/24  Neuro 8/20- MRI lumbar spine negative Test performed due to fecal incontinence and urinary retention

## 2021-01-13 NOTE — Progress Notes (Signed)
Kirke Shaggy, MD has been notified of NG tube being clogged. Provider ordered for tube feeding to be placed on hold. CBGs will continued to be monitored.

## 2021-01-13 NOTE — Progress Notes (Signed)
Patient lethargic but easily aroused. Pt is oriented to person, place, and situation but unable to stay awake long enough to have a conversation. Dr. Charlotta Newton on the unit and made aware. Dr. Charlotta Newton also reviewed fetal heart rate tracing. No new orders given. Carmelina Dane, RN

## 2021-01-13 NOTE — H&P (View-Only) (Signed)
Patient on BID Lovenox for DVT. Marland Kitchen I spoke with  TR[facility], Dr. Maryfrances Bunnell and he is fine with [place] holding this pm and tomorrow am dose of Lovenox.for the EGD tomorrow.  Marland Kitchen

## 2021-01-13 NOTE — Progress Notes (Signed)
VAST consult was received to place 2 PIV and remove PICC line. Upon assessment. This patient has a restricted R arm and extremely poor vasculature to the LFA. Veins to small to place PIV. At this time VAST recommends leaving PICC line in d/t the patient status and POC. Discussed with nurse Terri RN POC. CVC line care reviewed with nurse at [facility] also. Instructed to notify MD. Tomasita Morrow, RN VAST

## 2021-01-13 NOTE — Progress Notes (Signed)
Inpatient Diabetes Program Recommendations  AACE/ADA: New Consensus Statement on Inpatient Glycemic Control (2015)  Target Ranges:  Prepandial:   less than 140 mg/dL      Peak postprandial:   less than 180 mg/dL (1-2 hours)      Critically ill patients:  140 - 180 mg/dL   Lab Results  Component Value Date   GLUCAP 130 (H) 01/13/2021   HGBA1C 5.8 (H) 11/21/2020    Review of Glycemic Control Results for [name], Ashley Camacho (MRN [id number]) as of 01/13/2021 12:42  Ref. Range 01/13/2021 10:40 01/13/2021 11:41  Glucose-Capillary Latest Ref Range: 70 - 99 mg/dL 829 (H) 562 (H)    Freestyle Libre reapplied to right upper forearm. Previous sensor already removed by nursing staff. Discussed with Terri, RN to continue to monitor blood sugars using Freestyle and finger sticks for correlation. If after 6-8 hours Freestyle Libre not within 20-30 pts continue using finger sticks.  Secure chat sent to Dr Grace Bushy, MFM regarding BMZ. No concern for DKA or elevations due to being on IV insulin.  Following.   Thanks, Lujean Rave, MSN, RNC-OB Diabetes Coordinator [phone] (8a-5p)

## 2021-01-13 NOTE — Progress Notes (Signed)
[facility]  Assessment/ Plan: Pt is a 30 y.o. yo female  with 28 weeks of gestation G2 P1, type 1 diabetes diagnosed since age of 89, known chronic proteinuria, hypertension, DVT on anticoagulation who was admitted on 7/26 for severe N/V and malnutrition, seen as a consultation at the for the evaluation of proteinuria and AKI.  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  24h U is 0.36.  NOt a lot of protein at all.  It is not the cause of her hypoalbuminemia.  Patient reported that she has known proteinuria since young age and was seen by nephrologist before thought to be due to Diabetes  Blood pressure acceptable. Repeat UA with UTI, already on abx.  Kidney ultrasound with a moderate right hydronephrosis which is probably physiologic in pregnancy.  Left kidney unremarkable. All serologiesneg HAV/HCV/HBV. C3 and C4 WNL, ANA, antidsDNA.  Avoid ACE inhibitor or ARB.   #Acute kidney injury probably hemodynamically mediated in the setting of intractable nausea vomiting and hypoalbuminemia, anemia concomitant with recent hospitalization with sepsis/infection. No evidence of HELLP syndrome. It seems like the creatinine level ranging between 1.2-1.4 for last few weeks.  Adequate UOP around 2.0 L daily.  Can use lasix as needed, for now continue daily.   Continue with strict ins and out, daily lab and avoid NSAIDs.  #Intractable nausea vomiting and severe protein calorie malnutrition: Gi eval today.  Continue supportive care.  She is currently on nasal feeding tube.  Per [facility]/OB   #Lower extremity edema: Recent echo with EF of 50 to 55%.  Recommend high-protein diet from the tube feed.  Lasix as above   #Type 1 diabetes with chronic proteinuria: Currently on insulin.   #Pregnancy, third trimester: Followed by OB team.  Continue vitamin and oral supplement per them.   #Anemia:  Received 2 units of blood transfusion on 8/20.  Reportedly patient has some epistaxis and also on anticoagulation.    Per [facility]/OB/GI   #Urinary tract infection on amoxicillin for 7 days.  No further suggestions. Will sign off for now.  Please call with any questions or concerns.  Pt does not need follow up with nephrology until postpartum, her PCP can refer.    Subjective:  2.1L UOP yesterday, stable SCR this AM ANA, dsDNA, C3/C4 neg 24h U Protein 0.36gm SAlb 2.2 GI seeing for N/V, anemia  Objective Vital signs in last 24 hours: Vitals:   01/13/21 0400 01/13/21 0500 01/13/21 0600 01/13/21 0700  BP: (!) 119/58 (!) 111/57 (!) 112/44 (!) 119/54  Pulse: 83 81 82 83  Resp: 17 19 19 18   Temp:   (!) 97.1 F (36.2 C) 97.8 F (36.6 C)  TempSrc:   Axillary Axillary  SpO2:    94%  Weight:      Height:       Weight change: 4.84 kg  Intake/Output Summary (Last 24 hours) at 01/13/2021 1107 Last data filed at 01/13/2021 0931 Gross per 24 hour  Intake 2226.25 ml  Output 1951 ml  Net 275.25 ml        Labs: Basic Metabolic Panel: Recent Labs  Lab 01/10/21 0610 01/11/21 0759 01/12/21 0408 01/13/21 0509  NA 135 139  --  135  K 5.0 5.0  --  4.6  CL 109 111  --  109  CO2 20* 21*  --  21*  GLUCOSE 91  91 101*  --  109*  BUN 39* 39*  --  35*  CREATININE 1.47* 1.39* 1.44* 1.34*  CALCIUM 8.2* 8.2*  --  8.3*  PHOS 5.9* 5.6*  --   --     Liver Function Tests: Recent Labs  Lab 01/10/21 0610 01/11/21 0759 01/13/21 0509  AST  --   --  30  ALT  --   --  19  ALKPHOS  --   --  156*  BILITOT  --   --  1.8*  PROT  --   --  4.9*  ALBUMIN 2.8* 2.4* 2.2*    No results for input(s): LIPASE, AMYLASE in the last 168 hours. No results for input(s): AMMONIA in the last 168 hours. CBC: Recent Labs  Lab 01/10/21 1310 01/11/21 0218 01/11/21 0524 01/12/21 0408 01/13/21 0509  WBC 4.2  --  5.1 5.0 5.0  NEUTROABS 2.9  --   --   --   --   HGB 6.9*   < > 9.1* 8.9* 8.2*  HCT 21.9*   <  > 27.7* 27.2* 26.1*  MCV 95.2  --  92.6 94.4 95.6  PLT 185  --  186 185 207   < > = values in this interval not displayed.    Cardiac Enzymes: No results for input(s): CKTOTAL, CKMB, CKMBINDEX, TROPONINI in the last 168 hours. CBG: Recent Labs  Lab 01/13/21 0700 01/13/21 0733 01/13/21 0843 01/13/21 0946 01/13/21 1040  GLUCAP 68* 107* 110* 131* 137*     Iron Studies: No results for input(s): IRON, TIBC, TRANSFERRIN, FERRITIN in the last 72 hours. Studies/Results: MR LUMBAR SPINE WO CONTRAST  Result Date: 01/12/2021 CLINICAL DATA:  Initial evaluation for possible cauda equina syndrome, currently pregnant. EXAM: MRI LUMBAR SPINE WITHOUT CONTRAST TECHNIQUE: Multiplanar, multisequence MR imaging of the lumbar spine was performed. No intravenous contrast was administered. COMPARISON:  None available. FINDINGS: Segmentation: Standard. Lowest well-formed disc space labeled the L5-S1 level. Alignment: Physiologic with preservation of the normal lumbar lordosis. No listhesis. Vertebrae: Vertebral body height maintained without acute or chronic fracture. Bone marrow signal intensity diffusely decreased on T1 weighted imaging, most likely related to current pregnancy status. No discrete or worrisome osseous lesions. No abnormal marrow edema. Conus medullaris and cauda equina: Conus extends to the L1 level. Conus and cauda equina appear normal. No evidence for compression of the distal conus or nerve roots of the cauda equina. Paraspinal and other soft tissues: Prominent diffuse edema seen throughout the posterior subcutaneous soft tissues, consistent with anasarca. Enlarged gravid uterus partially visualized. Moderate right with mild left hydroureteronephrosis, most likely related to the gravid uterus. Disc levels: No significant disc pathology seen within the lumbar spine. Intervertebral discs are well hydrated with preserved disc height. No disc bulge or focal disc herniation. No significant facet  disease. No canal or neural foraminal stenosis or evidence for neural impingement. IMPRESSION: 1. Normal MRI of the lumbar spine. No evidence for compression of the distal conus or nerve roots of the cauda equina. No significant disc pathology, stenosis, or evidence for neural impingement. 2. Enlarged gravid uterus with secondary moderate right and mild left hydroureteronephrosis. 3. Diffuse edema/anasarca within the visualized peripheral soft tissues. Electronically Signed   By: Rise MuBenjamin  McClintock M.D.   On: 01/12/2021 23:22   US MFM OB FOLLOW UP  Result Date: 01/12/2021 ----------------------------------------------------------------------  OBSTETRICS REPORT                       (Signed Final 01/12/2021 04:53 pm) ---------------------------------------------------------------------- Patient Info  ID #:       [id number]                          D.O.B.:  09-14-1990 (29 yrs)  Name:       KAYRA CROWELL [facility]                 Visit Date: 01/12/2021 09:48 am ---------------------------------------------------------------------- Performed By  Attending:        Lin Landsman      Ref. Address:     [facility]                    MD                                                             [facility]                                                             [address]                                                             [address]                                                             [address]                                                             [place]                                                             [address]  Performed By:     Reinaldo Raddle            [facility]  Referred By:      Ellsworth Lennox  [name] ---------------------------------------------------------------------- Orders  #  Description                           Code        Ordered By  1  [place] MFM OB FOLLOW UP                    16945.03    Milas Hock ----------------------------------------------------------------------  #  Order #                     Accession #                Episode #  1  [id number]                   [id number]                 [id number] ---------------------------------------------------------------------- Indications  Polyhydramnios, second trimester,              O40.2XX0  antepartum condition or complication, fetus  unspecified  Pre-existing diabetes, type 1, in pregnancy,   O24.012  second trimester  Hyperemesis gravidarum                         O21.0  Anemia during pregnancy in second trimester    O99.012  Abnormal fetal ultrasound (? liver             O28.9  calcification)  [redacted] weeks gestation of pregnancy                Z3A.29 ---------------------------------------------------------------------- Fetal Evaluation  Num Of Fetuses:         1  Fetal Heart Rate(bpm):  133  Cardiac Activity:       Observed  Presentation:           Cephalic  Placenta:               Posterior  P. Cord Insertion:      Previously Visualized  Amniotic Fluid  AFI FV:      Polyhydramnios  AFI Sum(cm)     %Tile       Largest Pocket(cm)  34.2            > 97        12.7  RUQ(cm)       RLQ(cm)       LUQ(cm)        LLQ(cm)  10.4          3.8           7.3            12.7 ---------------------------------------------------------------------- Biometry  BPD:      74.2  mm     G. Age:  29w 5d         63  %    CI:        82.27   %    70 - 86                                                          FL/HC:      19.9   %    19.6 - 20.8  HC:  258.1  mm     G. Age:  28w 0d        4.1  %    HC/AC:      1.02        0.99 - 1.21  AC:      253.9  mm     G. Age:  29w 4d         62  %    FL/BPD:     69.1   %    71 - 87  FL:       51.3  mm     G. Age:  27w 3d          5  %    FL/AC:      20.2   %    20 - 24  Est. FW:    1277  gm    2 lb 13 oz      28  % ---------------------------------------------------------------------- Gestational Age  U/S  Today:     28w 5d                                        EDD:   04/01/21  Best:          29w 0d     Det. ByMarcella Dubs         EDD:   03/30/21                                      (08/27/20) ---------------------------------------------------------------------- Anatomy  Cranium:               Appears normal         Aortic Arch:            Previously seen  Cavum:                 Previously seen        Ductal Arch:            Previously seen  Ventricles:            Appears normal         Diaphragm:              Appears normal  Choroid Plexus:        Previously seen        Stomach:                Appears normal, left                                                                        sided  Cerebellum:            Previously seen        Abdomen:                Previously seen  Posterior Fossa:       Previously seen        Abdominal Wall:         Previously seen  Nuchal Fold:  Previously seen        Cord Vessels:           Previously seen  Lips:                  Previously seen        Kidneys:                Appear normal  Thoracic:              Appears normal         Bladder:                Appears normal  Heart:                 Previously seen        Spine:                  Previously seen  RVOT:                  Previously seen        Upper Extremities:      Previously seen  LVOT:                  Previously seen        Lower Extremities:      Previously seen  Other:  Technically difficult due to fetal position. ---------------------------------------------------------------------- Cervix Uterus Adnexa  Cervix  Not visualized (advanced GA >24wks) ---------------------------------------------------------------------- Impression  Follow up growth given poorly controlled DM with  gastroparesis and NG tube feeding.  Ms. Stammen remains hospitalized with chronic nausea and  vomiting.  Normal interval growth with measurements consistent with  dates  Good fetal movement and moderate-severe polyhydramnios  I  discussed the plan of care with Dr. Maryfrances Bunnell. We discussed  continued management of blood sugars with insulin drip and  have agreement with advancing her diet with the goal of  removing the NG tube.  Continue weekly testing with repeat growth in 3 weeks.  Will aim for possible delivery at 34 weeks, depending on her  status and improvement. ---------------------------------------------------------------------- Recommendations  Continue weekly testing  Repeat growth in 3-4 weeks. ----------------------------------------------------------------------               Lin Landsman, MD Electronically Signed Final Report   01/12/2021 04:53 pm ----------------------------------------------------------------------   Medications: Infusions:  sodium chloride Stopped (01/11/21 2000)   feeding supplement (OSMOLITE 1.5 CAL) 50 mL/hr at 01/12/21 1600   insulin 1.9 Units/hr (01/13/21 1043)   lactated ringers 10 mL/hr at 01/12/21 1610   lactated ringers Stopped (01/13/21 0622)   magnesium sulfate Stopped (01/13/21 0622)   promethazine (PHENERGAN)25mg  in 50mL NS 25 mg (01/08/21 2252)    Scheduled Medications:  sodium chloride   Intravenous Once   vitamin C  500 mg Oral Daily   betamethasone acetate-betamethasone sodium phosphate  12 mg Intramuscular Q24 Hr x 2   Chlorhexidine Gluconate Cloth  6 each Topical Daily   cholecalciferol  1,000 Units Oral Daily   enoxaparin  55 mg Subcutaneous Q12H   feeding supplement (PROSource TF)  45 mL Per Tube BID   folic acid  1 mg Per Tube Daily   furosemide  20 mg Intravenous Daily   mouth rinse  15 mL Mouth Rinse q12n4p   metoCLOPramide (REGLAN) injection  5 mg Intravenous Q6H   ondansetron (ZOFRAN) IV  4 mg Intravenous Q8H   pantoprazole (PROTONIX) IV  40 mg Intravenous Q12H  prenatal vitamin w/FE, FA  1 tablet Oral Q1200   sodium chloride flush  10-40 mL Intracatheter Q12H   sodium chloride flush  3 mL Intravenous Q12H   thiamine  100 mg Per Tube Daily   vitamin  B-6  12.5 mg Oral QHS    have reviewed scheduled and prn medications.  Physical Exam: General: Lying on bed, not in distress ENT: Coretrack in place Heart:RRR, s1s2 nl Lungs: Distant breath sound, no wheeze or crackle appreciated Abdomen:soft, nontender, distended with pregnancy Extremities: Bilateral lower extremity pitting edema 2-3+ Neurology: Alert awake.  Arita Miss 01/13/2021,11:07 AM  LOS: 28 days

## 2021-01-13 NOTE — Progress Notes (Signed)
Late Entry - 2140 - Pt's free style Ashley Camacho removed by MRI staff before procedure. Site WNL.

## 2021-01-13 NOTE — Progress Notes (Signed)
Patient on BID Lovenox for DVT. . I spoke with  TRH, Dr. Danford and he is fine with us holding this pm and tomorrow am dose of Lovenox.for the EGD tomorrow.  .  

## 2021-01-13 NOTE — Progress Notes (Signed)
ANTICOAGULATION CONSULT NOTE - Follow Up Consult  Pharmacy Consult for Lovenox Indication: DVT (right brachial DVT)  No Known Allergies  Patient Measurements: Height: 5\' 1"  (154.9 cm) Weight: 71 kg (156 lb 7 oz) IBW/kg (Calculated) : 47.8  Vital Signs: Temp: 98 F (36.7 C) (08/22 2313) Temp Source: Oral (08/22 2313) BP: 118/62 (08/23 0000) Pulse Rate: 86 (08/23 0000)  Labs: Recent Labs    01/10/21 0610 01/10/21 1310 01/10/21 1310 01/10/21 1648 01/11/21 0218 01/11/21 0524 01/11/21 0759 01/11/21 0915 01/12/21 0408 01/12/21 2252  HGB  --  6.9*   < >  --  9.2* 9.1*  --   --  8.9*  --   HCT  --  21.9*   < >  --  28.3* 27.7*  --   --  27.2*  --   PLT  --  185  --   --   --  186  --   --  185  --   APTT  --   --   --  52*  --   --   --   --   --   --   LABPROT  --   --   --  12.3  --   --   --   --   --   --   INR  --   --   --  0.9  --   --   --   --   --   --   HEPRLOWMOCWT  --   --   --   --  1.37  --   --  0.83  --  0.59  CREATININE 1.47*  --   --   --   --   --  1.39*  --  1.44*  --    < > = values in this interval not displayed.    Estimated Creatinine Clearance: 52 mL/min (A) (by C-G formula based on SCr of 1.44 mg/dL (H)).   Medications:  Scheduled:   sodium chloride   Intravenous Once   amoxicillin  500 mg Per Tube Q8H   vitamin C  500 mg Oral Daily   Chlorhexidine Gluconate Cloth  6 each Topical Daily   enoxaparin  55 mg Subcutaneous Q12H   feeding supplement (PROSource TF)  45 mL Per Tube BID   folic acid  1 mg Per Tube Daily   furosemide  20 mg Intravenous Daily   mouth rinse  15 mL Mouth Rinse q12n4p   metoCLOPramide (REGLAN) injection  5 mg Intravenous Q6H   pantoprazole (PROTONIX) IV  40 mg Intravenous Q12H   prenatal vitamin w/FE, FA  1 tablet Oral Q1200   sodium chloride flush  10-40 mL Intracatheter Q12H   sodium chloride flush  3 mL Intravenous Q12H   thiamine  100 mg Per Tube Daily   vitamin B-6  12.5 mg Oral QHS    Assessment: Anti Xa  level drawn ~4.75 hours after a dose was 0.59 units/ml. The goal for a 4 hour level is 0.6-1 units/ml.  Will continue current dose.  Goal of Therapy:  Anti-Xa level 0.6-1 units/ml 4hrs after LMWH dose given Monitor platelets by anticoagulation protocol: Yes   Plan:  Continue Lovenox 55 mg SQ Q12h  01/14/21 01/13/2021,12:55 AM

## 2021-01-14 ENCOUNTER — Inpatient Hospital Stay (HOSPITAL_COMMUNITY): Payer: Medicaid Other | Admitting: Certified Registered Nurse Anesthetist

## 2021-01-14 ENCOUNTER — Inpatient Hospital Stay (HOSPITAL_BASED_OUTPATIENT_CLINIC_OR_DEPARTMENT_OTHER): Payer: Medicaid Other

## 2021-01-14 ENCOUNTER — Inpatient Hospital Stay (HOSPITAL_COMMUNITY): Payer: Medicaid Other

## 2021-01-14 ENCOUNTER — Encounter (HOSPITAL_COMMUNITY): Payer: Self-pay | Admitting: Internal Medicine

## 2021-01-14 ENCOUNTER — Encounter (HOSPITAL_COMMUNITY)
Admission: AD | Disposition: A | Discharge status: Home / Self Care | Payer: Self-pay | Source: Other Acute Inpatient Hospital | Attending: Internal Medicine

## 2021-01-14 DIAGNOSIS — O24013 Pre-existing diabetes mellitus, type 1, in pregnancy, third trimester: Secondary | ICD-10-CM

## 2021-01-14 DIAGNOSIS — E109 Type 1 diabetes mellitus without complications: Secondary | ICD-10-CM | POA: Diagnosis not present

## 2021-01-14 DIAGNOSIS — O403XX0 Polyhydramnios, third trimester, not applicable or unspecified: Secondary | ICD-10-CM

## 2021-01-14 DIAGNOSIS — Z3A29 29 weeks gestation of pregnancy: Secondary | ICD-10-CM

## 2021-01-14 DIAGNOSIS — O210 Mild hyperemesis gravidarum: Secondary | ICD-10-CM | POA: Diagnosis not present

## 2021-01-14 DIAGNOSIS — O99013 Anemia complicating pregnancy, third trimester: Secondary | ICD-10-CM | POA: Diagnosis not present

## 2021-01-14 DIAGNOSIS — D649 Anemia, unspecified: Secondary | ICD-10-CM | POA: Diagnosis not present

## 2021-01-14 DIAGNOSIS — K209 Esophagitis, unspecified without bleeding: Secondary | ICD-10-CM

## 2021-01-14 DIAGNOSIS — O289 Unspecified abnormal findings on antenatal screening of mother: Secondary | ICD-10-CM | POA: Diagnosis not present

## 2021-01-14 HISTORY — PX: ESOPHAGOGASTRODUODENOSCOPY (EGD) WITH PROPOFOL: SHX5813

## 2021-01-14 HISTORY — PX: BIOPSY: SHX5522

## 2021-01-14 LAB — CBC
HCT: 27.4 % — ABNORMAL LOW (ref 36.0–46.0)
Hemoglobin: 8.9 g/dL — ABNORMAL LOW (ref 12.0–15.0)
MCH: 30.4 pg (ref 26.0–34.0)
MCHC: 32.5 g/dL (ref 30.0–36.0)
MCV: 93.5 fL (ref 80.0–100.0)
Platelets: 218 10*3/uL (ref 150–400)
RBC: 2.93 MIL/uL — ABNORMAL LOW (ref 3.87–5.11)
RDW: 15.7 % — ABNORMAL HIGH (ref 11.5–15.5)
WBC: 8 10*3/uL (ref 4.0–10.5)
nRBC: 0 % (ref 0.0–0.2)

## 2021-01-14 LAB — TYPE AND SCREEN
ABO/RH(D): A POS
Antibody Screen: NEGATIVE

## 2021-01-14 LAB — BASIC METABOLIC PANEL
Anion gap: 6 (ref 5–15)
BUN: 39 mg/dL — ABNORMAL HIGH (ref 6–20)
CO2: 22 mmol/L (ref 22–32)
Calcium: 8.4 mg/dL — ABNORMAL LOW (ref 8.9–10.3)
Chloride: 110 mmol/L (ref 98–111)
Creatinine, Ser: 1.49 mg/dL — ABNORMAL HIGH (ref 0.44–1.00)
GFR, Estimated: 48 mL/min — ABNORMAL LOW (ref >60)
Glucose, Bld: 142 mg/dL — ABNORMAL HIGH (ref 70–99)
Potassium: 4.5 mmol/L (ref 3.5–5.1)
Sodium: 138 mmol/L (ref 135–145)

## 2021-01-14 LAB — LIPASE, BLOOD: Lipase: 20 U/L (ref 11–51)

## 2021-01-14 LAB — GLUCOSE, CAPILLARY
Glucose-Capillary: 151 mg/dL — ABNORMAL HIGH (ref 70–99)
Glucose-Capillary: 178 mg/dL — ABNORMAL HIGH (ref 70–99)

## 2021-01-14 LAB — LACTATE DEHYDROGENASE: LDH: 165 U/L (ref 98–192)

## 2021-01-14 LAB — TSH: TSH: 3.22 u[IU]/mL (ref 0.350–4.500)

## 2021-01-14 SURGERY — ESOPHAGOGASTRODUODENOSCOPY (EGD) WITH PROPOFOL
Anesthesia: Monitor Anesthesia Care

## 2021-01-14 MED ORDER — SUCRALFATE 1 GM/10ML PO SUSP
1.0000 g | Freq: Three times a day (TID) | ORAL | Status: DC
  Administered 2021-01-14 – 2021-02-19 (×125): 1 g via ORAL
  Filled 2021-01-14 (×131): qty 10

## 2021-01-14 MED ORDER — VITAMIN B-6 25 MG PO TABS: 25.0000 mg | ORAL_TABLET | Freq: Two times a day (BID) | ORAL | Status: DC

## 2021-01-14 MED ORDER — ENOXAPARIN SODIUM 60 MG/0.6ML IJ SOSY
55.0000 mg | PREFILLED_SYRINGE | Freq: Two times a day (BID) | INTRAMUSCULAR | Status: DC
  Administered 2021-01-15 – 2021-01-16 (×3): 55 mg via SUBCUTANEOUS
  Filled 2021-01-14 (×3): qty 0.6

## 2021-01-14 MED ORDER — DULOXETINE HCL 20 MG PO CPEP
20.0000 mg | ORAL_CAPSULE | Freq: Every day | ORAL | Status: DC
  Administered 2021-01-15 – 2021-02-18 (×33): 20 mg via ORAL
  Filled 2021-01-14 (×35): qty 1

## 2021-01-14 MED ORDER — DOXYLAMINE SUCCINATE (SLEEP) 25 MG PO TABS: 25.0000 mg | ORAL_TABLET | Freq: Two times a day (BID) | ORAL | Status: DC

## 2021-01-14 MED ORDER — NIFEDIPINE ER OSMOTIC RELEASE 30 MG PO TB24
30.0000 mg | ORAL_TABLET | ORAL | Status: AC
  Administered 2021-01-14: 30 mg via ORAL
  Filled 2021-01-14: qty 1

## 2021-01-14 MED ORDER — GLUCERNA SHAKE PO LIQD
237.0000 mL | Freq: Two times a day (BID) | ORAL | Status: DC
  Administered 2021-01-14 – 2021-02-07 (×33): 237 mL via ORAL
  Filled 2021-01-14 (×53): qty 237

## 2021-01-14 MED ORDER — DOXYLAMINE SUCCINATE (SLEEP) 25 MG PO TABS
12.5000 mg | ORAL_TABLET | Freq: Two times a day (BID) | ORAL | Status: DC
  Administered 2021-01-14 – 2021-02-18 (×69): 12.5 mg via ORAL
  Filled 2021-01-14 (×71): qty 1

## 2021-01-14 MED ORDER — LACTATED RINGERS IV SOLN: INTRAVENOUS | Status: DC | PRN

## 2021-01-14 MED ORDER — PROPOFOL 500 MG/50ML IV EMUL
INTRAVENOUS | Status: DC | PRN
  Administered 2021-01-14: 150 ug/kg/min via INTRAVENOUS

## 2021-01-14 MED ORDER — LACTATED RINGERS IV BOLUS
500.0000 mL | Freq: Once | INTRAVENOUS | Status: AC
  Administered 2021-01-14: 500 mL via INTRAVENOUS

## 2021-01-14 MED ORDER — NIFEDIPINE 10 MG PO CAPS
10.0000 mg | ORAL_CAPSULE | Freq: Once | ORAL | Status: AC
  Administered 2021-01-15: 10 mg via ORAL
  Filled 2021-01-14: qty 1

## 2021-01-14 MED ORDER — FAMOTIDINE 20 MG PO TABS
20.0000 mg | ORAL_TABLET | Freq: Two times a day (BID) | ORAL | Status: DC
  Administered 2021-01-14 – 2021-01-17 (×7): 20 mg via ORAL
  Filled 2021-01-14 (×7): qty 1

## 2021-01-14 MED ORDER — HYDRALAZINE HCL 50 MG PO TABS
25.0000 mg | ORAL_TABLET | Freq: Three times a day (TID) | ORAL | Status: DC
  Administered 2021-01-14 – 2021-01-24 (×30): 25 mg via ORAL
  Filled 2021-01-14 (×31): qty 1

## 2021-01-14 MED ORDER — VITAMIN B-6 25 MG PO TABS
12.5000 mg | ORAL_TABLET | Freq: Two times a day (BID) | ORAL | Status: DC
  Administered 2021-01-14 – 2021-02-18 (×69): 12.5 mg via ORAL
  Filled 2021-01-14 (×70): qty 1

## 2021-01-14 SURGICAL SUPPLY — 15 items

## 2021-01-14 NOTE — Progress Notes (Signed)
PT Cancellation Note  Patient Details Name: Ashley Camacho MRN: [id number] DOB: 1990/06/22   Cancelled Treatment:    Reason Eval/Treat Not Completed: Patient at procedure or test/unavailable.  Pt at procedure.  PT to check back later today or tomorrow as time allows.   Thanks,  Corinna Capra, PT, DPT  Acute Rehabilitation Ortho Tech Supervisor [phone] pager #[phone] office      Lurena Joiner B Dava Rensch 01/14/2021, 9:07 AM

## 2021-01-14 NOTE — Progress Notes (Signed)
[facility] Progress Note  Patient: Ashley Camacho    OYD:[id number]  DOA: 12/16/2020     Date of Service: the patient was seen and examined on 01/14/2021  Brief hospital course: 30 year old female with past medical history of type 1 diabetes, cardiomyopathy, chronic systolic CHF who is 24 weeks 4 days pregnant on presentation.  Presents with complaints of intractable nausea and vomiting. 6/30 admission to [facility] for nausea and vomiting. 7/4-7/20 transferred to [facility] for pneumonia.  Due to rhinovirus. 7/22-7/26 admitted to [facility] again with starvation ketosis, also DVT of brachial vein.  Also started on TPN, PICC line inserted. 7/26 admitted to [facility] 7/27 cardiology consulted for cardiomyopathy, EF visit transfuse, PICC line inserted, tube feeding started. 7/29 having recurrent retention, Foley catheter inserted 7/30 PRBC transfused 8/5 PRBC transfused 8/16 cardiology signed off, no follow-up recommended 8/19 nephrology consulted, no follow-up recommended 8/20 PRBC transfused 8/21 rescue course of betamethasone 8/22 Foley catheter reinserted after removal due to retention. 8/23 GI consulted. MFM consulted, nephrology signed off. 8/24 underwent EGD shows severe esophagitis with mild oozing.  Dobbhoff removed patient on oral diet. Currently plan is further work-up for anemia, monitor for improvement in oral diet, ensure that the patient is able to carry pregnancy up to 32 weeks.  Subjective: Denies any nausea.  Tolerating oral bites.  Reports diarrhea as a smear.  No blood in the stool.  No hematochezia no melena.  No abdominal pain.  No dizziness or lightheadedness right now.  No chest pain right now.  Patient had leg pains for which she was started on Cymbalta but currently has no pain.  Assessment and Plan: 1.  Adult failure to thrive Moderate protein calorie malnutrition Consult dietary. Patient was on TPN followed by tube feedings followed by now  regular diet.  Will add nutritional supplements. Continue multivitamins.  2.  Intractable nausea and vomiting. Food aversion. EGD performed.  Shows esophagitis. Do not think the patient is suffering from gastroparesis.  GI also agrees. Patient is on scheduled Reglan and Zofran. Continue PPI and Pepcid. Having bowel movements thus less likely obstruction. Ultrasound abdomen shows no significant abnormality related to liver or pancreas. Was on Remeron that did not work for improvement in oral intake. Initial plan was to continue core track until delivery at 34 weeks.  3.  Normocytic anemia Likely multifactorial. Out of proportion to anemia of pregnancy. Based on hemoglobin around 10.  Started in May with hemoglobin around 7.  Hematology has evaluated patient felt that this might be associated with iron deficiency. Extensive work-up so far has been performed which includes hemoglobin electrophoresis, nutritional work-up as well as pathologist smear review. Copper level normal. B12 level is relatively low now normal. Repeating work-up for now. FOBT positive.  Haptoglobin and LDH normal. Not a candidate for colonoscopy in the setting of ongoing pregnancy. Given that the patient is not having ongoing massive bleeding, work-up can wait until she delivers.  4.  Nutritional deficiency. Replaced extensively. Recheck labs. Dietary consultation.  5.  Type 1 diabetes mellitus with hyperand hypoglycemia on insulin pump with long-term insulin use with potential neuropathy complication Patient is on insulin pump at home.  Currently on IV insulin.  Monitor.  6.  UTI. Completed 7 days of antibiotics.  7.  Acute on chronic systolic CHF Baseline EF 45%  Presents with volume overload. Treated with IV diuresis under cardiology consult. Repeat echocardiogram shows significant improvement.  8.  Hypoalbuminemia. Third spacing. Patient's albumin level has been  running low. Work-up so far shows  that patient has bilateral lower extremity edema, pericardial effusion, pleural effusion. ANA and dsDNA are negative. Pericardial effusion not significant enough to cause any problems. Will require albumin support for adequate diuresis if renal function does not improve.  9.  Acute kidney injury. Urinary retention Likely hemodynamically mediated. Nephrology was consulted. Patient has mild proteinuria. Ultrasound shows right renal hydronephrosis which appears to be physiologic in the setting of pregnancy. Serologies were negative including HIV, HCV, HBV, C3-C4, and dsDNA. Continue Foley catheter for now.  10.  Pregnancy. Currently on 29-week. C-section at 34 weeks. Received steroids. Monitor.  11.  Right upper extremity DVT. On therapeutic anticoagulation.  Will continue only 34-month of anticoagulation.  Scheduled Meds:  sodium chloride   Intravenous Once   Chlorhexidine Gluconate Cloth  6 each Topical Daily   cholecalciferol  1,000 Units Oral Daily   vitamin B-6  12.5 mg Oral BID   And   doxylamine (Sleep)  12.5 mg Oral BID   [START ON 01/15/2021] DULoxetine  20 mg Oral Daily   [START ON 01/15/2021] enoxaparin (LOVENOX) injection  55 mg Subcutaneous Q12H   famotidine  20 mg Oral BID   feeding supplement (GLUCERNA SHAKE)  237 mL Oral BID BM   folic acid  1 mg Per Tube Daily   furosemide  20 mg Intravenous BID   hydrALAZINE  25 mg Oral Q8H   mouth rinse  15 mL Mouth Rinse q12n4p   metoCLOPramide (REGLAN) injection  5 mg Intravenous Q6H   ondansetron (ZOFRAN) IV  4 mg Intravenous Q8H   pantoprazole (PROTONIX) IV  40 mg Intravenous Q12H   prenatal vitamin w/FE, FA  1 tablet Oral Q1200   sodium chloride flush  10-40 mL Intracatheter Q12H   sodium chloride flush  3 mL Intravenous Q12H   sucralfate  1 g Oral TID WC & HS   thiamine injection  100 mg Intravenous Daily   Continuous Infusions:  sodium chloride Stopped (01/11/21 2000)   ascorbic acid 200mg  in dextrose 5% 5ml 100  mL/hr at 01/14/21 1654   dextrose 5% lactated ringers 50 mL/hr at 01/14/21 1348   insulin 1 Units/hr (01/14/21 1607)   promethazine (PHENERGAN)25mg  in 43mL NS 25 mg (01/08/21 2252)   PRN Meds: sodium chloride, acetaminophen, dextrose, diphenhydrAMINE-zinc acetate, hydrocortisone cream, loperamide, oxyCODONE, prochlorperazine, promethazine (PHENERGAN)25mg  in 17mL NS, sodium chloride, sodium chloride flush, sodium chloride flush  Body mass index is 31.53 kg/m.  Nutrition Problem: Inadequate oral intake Etiology: nausea, vomiting     DVT Prophylaxis: Therapeutic Lovenox.     Advance goals of care discussion: Pt is Full code.  Family Communication: no family was present at bedside, at the time of interview.   Data Reviewed: I have personally reviewed and interpreted daily labs, tele strips, imaging. Sodium 138, serum creatinine 1.49, hemoglobin stable.  Physical Exam:  General: Appear in mild distress, no Rash; Oral Mucosa Clear, moist. no Abnormal Neck Mass Or lumps, Conjunctiva normal  Cardiovascular: S1 and S2 Present, no Murmur, Respiratory: good respiratory effort, Bilateral Air entry present and CTA, no Crackles, no wheezes Abdomen: Bowel Sound present, Soft and no tenderness Extremities: bilateral  Pedal edema Neurology: alert and oriented to time, place, and person affect flat in affect. no new focal deficit Gait not checked due to patient safety concerns  Vitals:   01/14/21 1225 01/14/21 1440 01/14/21 1441 01/14/21 1550  BP: (!) 175/68 (!) 117/48 (!) 119/50 134/62  Pulse:  85 84 81  Resp:    18  Temp:    98.6 F (37 C)  TempSrc:    Oral  SpO2:    100%  Weight:      Height:        Disposition:  Status is: Inpatient  Remains inpatient appropriate because:IV treatments appropriate due to intensity of illness or inability to take PO  Dispo: The patient is from: Home              Anticipated d/c is to: Home              Patient currently is not medically stable  to d/c.   Difficult to place patient No  Time spent: 35 minutes. I reviewed all nursing notes, pharmacy notes, vitals, pertinent old records. I have discussed plan of care as described above with RN.  Author: Lynden Oxford, MD [facility] 01/14/2021 7:10 PM  To reach On-call, see care teams to locate the attending and reach out via [email or web address]. Between 7PM-7AM, please contact night-coverage If you still have difficulty reaching the attending provider, please page the [facility] (Director on Call) for [facility] on amion for assistance.

## 2021-01-14 NOTE — Progress Notes (Signed)
Physical Therapy Treatment Patient Details Name: Ashley Camacho MRN: [id number] DOB: [birth year] Today's Date: 01/14/2021    History of Present Illness 30 y.o. 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 Medical history significant of [redacted]w[redacted]d on presentation, Estimated Date of Delivery: 03/30/21 by by 9 week ultrasound however trying to make it to 34w for planned c-section, Type 1 diabetic, Cardiomyopathy, chronic systolic heart failure, DVT in bilateral arm. H/o recent hospitalizations d/t hyperemesis and sepsis secondary to pneumonia.    PT Comments    Pt with the best affect today that I have seen the whole time she has been here.  Significant other and daughter "[name]" was visiting.  She continues to report R LE pain, not much shoulder pain anymore, but is walking better than last session.  We continue to use the rollator for improved endurance and the seat.  Goals are due next session.    Follow Up Recommendations  No PT follow up     Equipment Recommendations  None recommended by PT    Recommendations for Other Services       Precautions / Restrictions Precautions Precautions: Other (comment) Precaution Comments: frequent vomiting, cotrak. h/o RUE DVT (BPs taken in legs)    Mobility  Bed Mobility               General bed mobility comments: Pt was OOB in the chair    Transfers Overall transfer level: Needs assistance Equipment used: 4-wheeled walker   Sit to Stand: Min guard         General transfer comment: min guard assist for safety multiple lines  Ambulation/Gait Ambulation/Gait assistance: Supervision Gait Distance (Feet): 130 Feet Assistive device: 4-wheeled walker Gait Pattern/deviations: Step-through pattern;Wide base of support;Antalgic (pt reports thighs are sticking together bil LE edema) Gait velocity: decr   General Gait Details: Pt  reporting R leg pain/pulling, antalgic gait pattern, one seated rest break half way.   Stairs             Wheelchair Mobility    Modified Rankin (Stroke Patients Only)       Balance Overall balance assessment: Mild deficits observed, not formally tested                                          Cognition Arousal/Alertness: Awake/alert Behavior During Therapy: Flat affect;WFL for tasks assessed/performed Overall Cognitive Status: Within Functional Limits for tasks assessed                                 General Comments: much improved affect      Exercises General Exercises - Lower Extremity Ankle Circles/Pumps: AROM;Both;20 reps    General Comments        Pertinent Vitals/Pain Pain Assessment: Faces Faces Pain Scale: Hurts little more Pain Location: R LE Pain Descriptors / Indicators: Grimacing;Guarding Pain Intervention(s): Limited activity within patient's tolerance;Monitored during session;Repositioned    Home Living                      Prior Function            PT Goals (current goals can now be found in the care plan section) Progress towards PT goals: Progressing toward goals    Frequency  Min 3X/week      PT Plan Current plan remains appropriate    Co-evaluation              AM-PAC PT "6 Clicks" Mobility   Outcome Measure  Help needed turning from your back to your side while in a flat bed without using bedrails?: None Help needed moving from lying on your back to sitting on the side of a flat bed without using bedrails?: None Help needed moving to and from a bed to a chair (including a wheelchair)?: None Help needed standing up from a chair using your arms (e.g., wheelchair or bedside chair)?: A Little Help needed to walk in hospital room?: A Little Help needed climbing 3-5 steps with a railing? : A Little 6 Click Score: 21    End of Session   Activity Tolerance: Patient limited  by pain;Patient limited by fatigue Patient left: with call bell/phone within reach;in chair   PT Visit Diagnosis: Other abnormalities of gait and mobility (R26.89);Muscle weakness (generalized) (M62.81)     Time: 5701-7793 PT Time Calculation (min) (ACUTE ONLY): 17 min  Charges:  $Gait Training: 8-22 mins                     Corinna Capra, PT, DPT  Acute Rehabilitation Ortho Tech Supervisor [phone] pager [phone]) [phone] office

## 2021-01-14 NOTE — Progress Notes (Signed)
RN notified by EGD staff of procedure time at [phone]. Pt transported  off unit via wheelchair by EGD RN at 0720. Pt alert, oriented x4.

## 2021-01-14 NOTE — Op Note (Signed)
[facility] Patient Name: Ashley Camacho Procedure Date : 01/14/2021 MRN: [id number] Attending MD: Tressia Danas MD, MD Date of Birth: 1990-11-17 CSN: [id number] Age: 30 Admit Type: Inpatient Procedure:                Upper GI endoscopy Indications:              Nausea with vomiting, unexplained anemia Providers:                Tressia Danas MD, MD, Adolph Pollack, RN,                            Lawson Radar, Technician, Orvilla Fus, CRNA Referring MD:              Medicines:                Monitored Anesthesia Care Complications:            No immediate complications. Estimated blood loss:                            Minimal. Estimated Blood Loss:     Estimated blood loss was minimal. Procedure:                Pre-Anesthesia Assessment:                           - Prior to the procedure, a History and Physical                            was performed, and patient medications and                            allergies were reviewed. The patient's tolerance of                            previous anesthesia was also reviewed. The risks                            and benefits of the procedure and the sedation                            options and risks were discussed with the patient.                            All questions were answered, and informed consent                            was obtained. Prior Anticoagulants: The patient has                            taken Lovenox (enoxaparin), last dose was 1 day                            prior to procedure. ASA Grade Assessment: II - A  patient with mild systemic disease. After reviewing                            the risks and benefits, the patient was deemed in                            satisfactory condition to undergo the procedure.                           After obtaining informed consent, the endoscope was                            passed under direct vision. Throughout the                             procedure, the patient's blood pressure, pulse, and                            oxygen saturations were monitored continuously. The                            [id number]-[id number] ([id number]) Olympus endoscope was introduced                            through the mouth, and advanced to the third part                            of duodenum. The upper GI endoscopy was                            accomplished without difficulty. The patient                            tolerated the procedure well. Scope In: Scope Out: Findings:      LA Grade C (one or more mucosal breaks continuous between tops of 2 or       more mucosal folds, less than 75% circumference) ulcerated esophagitis       was found 36 cm from the incisors. Biopsies were taken with a cold       forceps for histology. Estimated blood loss was minimal.      The entire examined stomach was normal. Biopsies were taken from the       antrum, body, and fundus with a cold forceps for histology. Biopsies       were taken with a cold forceps for histology. Estimated blood loss was       minimal.      The examined duodenum was normal. Biopsies were taken with a cold       forceps for histology. Estimated blood loss was minimal. Impression:               - LA Grade C reflux esophagitis. Biopsied. This may                            be the result of symptoms that initially started  with hyperemesis gravidarum, but, may be severe                            enough now to be causing her ongoing symptoms and                            anemia.                           - Normal stomach. Biopsied.                           - Normal examined duodenum. Biopsied. Recommendation:           - Return patient to hospital ward for ongoing care.                           - Advance diet as tolerated. Her Dobhoff was                            removed during this procedure. She would like to                            try to eat  prior to replacing the enteral feeding                            tube.                           - Continue present medications. Add famotidine 20                            mg BID and Carafate 1 g slurry QID for at least 2                            weeks.                           - Keep the head of the bed elevated as much as                            possible.                           - Avoid foods associated with reflux.                           - Await pathology results.                           - Proceed with abdominal ultrasound as previously                            ordered.                           -  May resume Lovenox for DVT prophylaxis as needed.                           The results were discussed with the patient in the                            endoscopy recovery room. I also discussed these                            findings with Dr. Maryfrances Bunnell by phone. Procedure Code(s):        --- Professional ---                           [phone], Esophagogastroduodenoscopy, flexible,                            transoral; with biopsy, single or multiple Diagnosis Code(s):        --- Professional ---                           K21.00, Gastro-esophageal reflux disease with                            esophagitis, without bleeding                           R11.2, Nausea with vomiting, unspecified CPT copyright 2019 [employer]. All rights reserved. The codes documented in this report are preliminary and upon coder review may  be revised to meet current compliance requirements. Tressia Danas MD, MD 01/14/2021 9:23:30 AM This report has been signed electronically. Number of Addenda: 0

## 2021-01-14 NOTE — Progress Notes (Signed)
FACULTY PRACTICE ANTEPARTUM(COMPREHENSIVE) NOTE  Ashley Camacho is a 30 y.o. G2P0101 with Estimated Date of Delivery: 03/30/21   By  LMP [redacted]w[redacted]d  who is admitted for nausea/vomiting, Type 1 DM and associated complications.    Fetal presentation is cephalic. Length of Stay:  29  Days  Date of admission:12/16/2020  Subjective: Pt states she is doing ok, maybe a little better than yesterday.  Feels less drowsy.  Denies headache or blurry vision.  No RUQ pain.  She did not mention leg pain today.  No F/C/CP/SOB.  NG tube removed following procedure this am. Patient reports the fetal movement as  about the same- still decreased . Patient reports uterine contraction  activity as notes mild contractions, rates her pain 4/10. Patient reports  vaginal bleeding as none. Patient describes fluid per vagina as None.  Vitals:  Blood pressure (!) 140/44, pulse 87, temperature 98 F (36.7 C), temperature source Temporal, resp. rate (!) 32, height 5\' 1"  (1.549 m), weight 75.7 kg, SpO2 95 %. Vitals:   01/14/21 0736 01/14/21 0846 01/14/21 0856 01/14/21 0907  BP: (!) 142/58 115/82 (!) 131/53 (!) 140/44  Pulse: 87 86 88 87  Resp: 16 (!) 24 (!) 30 (!) 32  Temp: 98.7 F (37.1 C) 98 F (36.7 C)    TempSrc: Oral Temporal    SpO2: 96% 98% 97% 95%  Weight:      Height:       Physical Examination:  General appearance - improved Mental status - normal mood, behavior, speech, dress, motor activity, and thought processes Chest - decreased breath sounds at bases Heart - normal rate and regular rhythm Abdomen - gravid, soft and non-tender Pelvic - SVE: 2/70/-2 Extremities - 3+ edema- somewhat improved Skin - warm and dry  Fetal Monitoring:  Baseline: 140 bpm, Variability: moderate, Accelerations: +10x10 accels, and Decelerations: Absent   appropriate for current gestational age  Labs:  Results for orders placed or performed during the hospital encounter of 12/16/20 (from the past 24 hour(s))  Glucose,  capillary   Collection Time: 01/13/21 11:41 AM  Result Value Ref Range   Glucose-Capillary 130 (H) 70 - 99 mg/dL  Glucose, capillary   Collection Time: 01/13/21 12:52 PM  Result Value Ref Range   Glucose-Capillary 106 (H) 70 - 99 mg/dL  Glucose, capillary   Collection Time: 01/13/21  1:47 PM  Result Value Ref Range   Glucose-Capillary 109 (H) 70 - 99 mg/dL  Glucose, capillary   Collection Time: 01/13/21  2:46 PM  Result Value Ref Range   Glucose-Capillary 103 (H) 70 - 99 mg/dL  Glucose, capillary   Collection Time: 01/13/21  4:48 PM  Result Value Ref Range   Glucose-Capillary 121 (H) 70 - 99 mg/dL  Glucose, capillary   Collection Time: 01/13/21  5:43 PM  Result Value Ref Range   Glucose-Capillary 138 (H) 70 - 99 mg/dL  Glucose, capillary   Collection Time: 01/13/21  6:44 PM  Result Value Ref Range   Glucose-Capillary 150 (H) 70 - 99 mg/dL  Glucose, capillary   Collection Time: 01/14/21  4:41 AM  Result Value Ref Range   Glucose-Capillary 151 (H) 70 - 99 mg/dL  Lipase, blood   Collection Time: 01/14/21  5:18 AM  Result Value Ref Range   Lipase 20 11 - 51 U/L  TSH Once   Collection Time: 01/14/21  5:18 AM  Result Value Ref Range   TSH 3.220 0.350 - 4.500 uIU/mL  Lactate dehydrogenase   Collection Time: 01/14/21  5:18 AM  Result Value Ref Range   LDH 165 98 - 192 U/L  CBC   Collection Time: 01/14/21  5:18 AM  Result Value Ref Range   WBC 8.0 4.0 - 10.5 K/uL   RBC 2.93 (L) 3.87 - 5.11 MIL/uL   Hemoglobin 8.9 (L) 12.0 - 15.0 g/dL   HCT 75.6 (L) 43.3 - 29.5 %   MCV 93.5 80.0 - 100.0 fL   MCH 30.4 26.0 - 34.0 pg   MCHC 32.5 30.0 - 36.0 g/dL   RDW 18.8 (H) 41.6 - 60.6 %   Platelets 218 150 - 400 K/uL   nRBC 0.0 0.0 - 0.2 %  Basic metabolic panel   Collection Time: 01/14/21  5:18 AM  Result Value Ref Range   Sodium 138 135 - 145 mmol/L   Potassium 4.5 3.5 - 5.1 mmol/L   Chloride 110 98 - 111 mmol/L   CO2 22 22 - 32 mmol/L   Glucose, Bld 142 (H) 70 - 99 mg/dL    BUN 39 (H) 6 - 20 mg/dL   Creatinine, Ser 3.01 (H) 0.44 - 1.00 mg/dL   Calcium 8.4 (L) 8.9 - 10.3 mg/dL   GFR, Estimated 48 (L) >60 mL/min   Anion gap 6 5 - 15  Type and screen [facility]   Collection Time: 01/14/21  5:40 AM  Result Value Ref Range   ABO/RH(D) A POS    Antibody Screen NEG    Sample Expiration      01/17/2021,2359 Performed at [facility], [address], [place], [place] [address]   Glucose, capillary   Collection Time: 01/14/21  8:49 AM  Result Value Ref Range   Glucose-Capillary 178 (H) 70 - 99 mg/dL    Imaging Studies:      Medications:  Scheduled  sodium chloride   Intravenous Once   betamethasone acetate-betamethasone sodium phosphate  12 mg Intramuscular Q24 Hr x 2   Chlorhexidine Gluconate Cloth  6 each Topical Daily   cholecalciferol  1,000 Units Oral Daily   famotidine  20 mg Oral BID   feeding supplement (PROSource TF)  45 mL Per Tube BID   folic acid  1 mg Per Tube Daily   furosemide  20 mg Intravenous BID   mouth rinse  15 mL Mouth Rinse q12n4p   metoCLOPramide (REGLAN) injection  5 mg Intravenous Q6H   ondansetron (ZOFRAN) IV  4 mg Intravenous Q8H   pantoprazole (PROTONIX) IV  40 mg Intravenous Q12H   prenatal vitamin w/FE, FA  1 tablet Oral Q1200   pyridOXINE  100 mg Intravenous Daily   sodium chloride flush  10-40 mL Intracatheter Q12H   sodium chloride flush  3 mL Intravenous Q12H   sucralfate  1 g Oral TID WC & HS   thiamine injection  100 mg Intravenous Daily   I have reviewed the patient's current medications.  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)  - Diabetes type 1  - Anemia in pregnancy   -AKI (acute kidney injury) (HCC)  - Recent DVT) of brachial vein of right upper extremity (HCC)  - Acute on chronic combined systolic and diastolic CHF (congestive heart failure) (HCC) - [redacted] weeks gestation of pregnancy -  Urinary retention - Diarrhea/fecal incontinence  PLAN: 1) OB care -Preterm contractions noted pt does not report as painful Cervical exam unchanged -FWB- reassuring for current gestational age  BPP weekly, growth q 4wks MFM following S/p BMZ 8/24-8/25  2) GI concerns -GI on board, EGD completed, esophagitis noted -on pepcid, protonix, carafate -Diarrhea  C.diff neg  Immodium added -Nausea/vomting  Scheduled zofran, reglan, scop patch, vitamin B6 -FEN  NG tube removed, plan to advance diet as tolerated  Continue vitamin supplementation  3) Type 1 DM -currently on endotool -ideally once tolerating gen diet, will transition off with assistance of DM coordinator  4) Neph/GU -UOP remains adequate, continue strict I/Os -Cr stable -consider possible removal of foley if patient more mobile  5) Heme -Anemia  Etiology remains unclear though currently Hgb stable -h/o right brachial VTE  Lovenox 55u bid  Repeat anti-Xa levels on Thursday 6) Cardio -HTN- continue Hydralazine 25mg  q8hr -s/p cardiology consult CHF stable  7) Maternal care -tylenol and oxy prn pain -work on ambulation, PT on board  DISPO: With NG tube removed and no evidence of bleed, plan to work on diet and ambulation. Continue management as outlined above  , DO Attending Obstetrician & Gynecologist, [facility] for [facility]

## 2021-01-14 NOTE — Consult Note (Signed)
MFM Follow up consultation (Delayed note)   Ms. Wolverton is a 30 yo G2P1 who is here at 42 w 2d with an EDD of 12/16/20 here for persistent nausea and vomiting and type 1 DM.  She is dated by a LMP and 9w 2d ultrasound.  Her medical conditions have been primarily medical in nature and not pregnancy related. The care team includes Hospitalist group, OB Faculty practice, MFM, Diabetes coordinator, cardiology, Nephrology and gastroenterology.   Her care originated in [place], was treated in July at [facility] for pneumonia. She continued care at [facility] until recent transfer to [facility] for further nutritional support, diabetes management and nausea on 12/16/20.  The fetus has normal growth and testing throughout her stay.  This note reflects a review of care to date.   Her pregnancy related issue includes the following: OB History of preterm delivery. She is currently stable; she has had mild contractions recently. Her most recent cervical exam is 2/70/-3. Her prior pregnancy she delivered at preterm via cesarean delivery that was unspecified.  On 01/12/22 she was started on Magnesium for neuroprotection due to new onset of preterm contractions.  Since that time her contractions have lessened but still present. She was given a rescue course of betamethasone on 08/21 given her new onset of contraction and history of preterm delivery. She has low risk NIPS and AFP. Fetal testing has been reactive with NST's daily and most recent growth was 28th% 1277g. However polyhydramnios with an AFI of 34.2 was observed.   Gastrointestinal with N/V Ms. Lupi's nausea is suspected to be related to gastroparesis, which is a diagnosis suspected during her care at [facility], however, she was having mild N/V then with 1-2 episodes of dry heving. She was capable of oral feeding with mild nausea. It appears that her symptoms worsen while at [facility] for treatment of pneumonia. After discharge she presented at [facility] with intractable nausea and worsening  nutritional status. She was treated with TPN for ?? days. She was transferred to [facility] an a cortrack was placed post-pyloric. She continues daily nausea and vomiting with at time coffee ground emesis. In combination with her ongoing anemia a GI consultation was placed that offered an EGD for evaluation for possible organic causes. Nutritional-  she has a calculated tube feed goal of 1880 kcal with 97 grams of protein. Including supplements of PNV, thiamine, iron 60 mg q day, folic acid and B6.  The total cal intake is appropriate using harris benedict equation- of BEE_females = 655.1 + (9.563 * Weight) + (1.85 * Height) - (4.676 * Age) + 450 kcal for third trimester. 1330 +450= 1780. dfdf The tube feed has been present for 28 days currently.  Diabetes She is currently being managed with IV insulin. She has good control with goral range of < 140 mg/DL pre prandial, < 952 mg/dL peak post prandial. She is known to be very sensitive to insulin treatment. She has a free style Libre applied to the upper right forearm. These goals are in place given the BMZ administration. Her recent blood sugars were 137 and 130. Prior to BMZ her blood sugars on average were 83, 110 and 124 mg/dL. When a previous attempt to discontinue the endotool she had a CBG of 34 mg/dL.  Please see Lauren McDaniels notes on 08/23 and 08/22 for further details.  Renal- AKI with diabetic nephropathy. Ms. Radziewicz creatinine is currently 1.49 which has ranged from 1.09-1.54 mg/dL. Her last Cr < 1 was on 07/27 0.49 at  [facility] the last normal was 0.81. While at [facility] her Cr ranged from from 1.06 to 1.54 and since admission here her Cr ranged from 0.49 to 1.49. Nephrology has followed the patient. The current diagnosis is due to diabetic nephropathy and subnephrotic range of proteinuria with most recent UPC of 1.4 g and the on 08/20 0.29 g.   AKI is suspected due to N/V, hypoalbumenia, anemia and recent sepsis. I agree with nephrology that this is not  HELLP syndrome. Her blood pressure is normal. She is currently managed with daily Lasix, with strict in's and outs. She developed urinary retention on 08/22-23 and has a foley in place. She is also treated with amoxicillin for a UTI. MRI performed and normal. Heme- Normocytic anemia Ms. Hillebrand had a SVC DVT and has been on therapeutic enoxaparin she is on Lovenox 55 mg sq 12 hr with most recent Xa- 0.59 with a 4 hour level of 0.6 to 1.u/mL. She is being followed by pharmacy. She has received ~7 units throughout the pregnancy most recent 2 PRBC's given on 08/20 current HCT is 26.1 with hgb 8.2.  Cardiology History of CHF with most recent echocardiogram with an EFW of 50-55%. Cardiology expects a small weight gain. No further recommendations and do not recommend telemetry at this time.  Hypertension is stable on Procardia 30 mg daily. Muskuloskeletal  Lower leg edema ruled on LE DVT on 01/08/21. She is seeing occupational therapy at this time.  Pulmonology- No signs of pneumonia at this time.  Vitals with BMI 01/14/2021 01/14/2021 01/14/2021  Height - - -  Weight - - -  BMI - - -  Systolic 140 131 025  Diastolic 44 53 82  Pulse 87 88 86    CBC Latest Ref Rng & Units 01/14/2021 01/13/2021 01/12/2021  WBC 4.0 - 10.5 K/uL 8.0 5.0 5.0  Hemoglobin 12.0 - 15.0 g/dL 8.5(I) 7.7(O) 8.9(L)  Hematocrit 36.0 - 46.0 % 27.4(L) 26.1(L) 27.2(L)  Platelets 150 - 400 K/uL 218 207 185    CMP Latest Ref Rng & Units 01/14/2021 01/13/2021 01/12/2021  Glucose 70 - 99 mg/dL 242(P) 536(R) -  BUN 6 - 20 mg/dL 44(R) 15(Q) -  Creatinine 0.44 - 1.00 mg/dL 0.08(Q) 7.61(P) 5.09(T)  Sodium 135 - 145 mmol/L 138 135 -  Potassium 3.5 - 5.1 mmol/L 4.5 4.6 -  Chloride 98 - 111 mmol/L 110 109 -  CO2 22 - 32 mmol/L 22 21(L) -  Calcium 8.9 - 10.3 mg/dL 2.6(Z) 8.3(L) -  Total Protein 6.5 - 8.1 g/dL - 4.9(L) -  Total Bilirubin 0.3 - 1.2 mg/dL - 1.8(H) -  Alkaline Phos 38 - 126 U/L - 156(H) -  AST 15 - 41 U/L - 30 -  ALT 0 - 44 U/L  - 19 -    Current Facility-Administered Medications (Endocrine & Metabolic):    insulin regular, human (MYXREDLIN) 100 units/ 100 mL infusion   Current Facility-Administered Medications (Cardiovascular):    furosemide (LASIX) injection 20 mg   hydrALAZINE (APRESOLINE) tablet 25 mg   Current Facility-Administered Medications (Respiratory):    promethazine (PHENERGAN) 25 mg in sodium chloride 0.9 % 50 mL IVPB   sodium chloride (OCEAN) 0.65 % nasal spray 1 spray   Current Facility-Administered Medications (Analgesics):    acetaminophen (TYLENOL) tablet 650 mg   oxyCODONE (Oxy IR/ROXICODONE) immediate release tablet 5 mg   Current Facility-Administered Medications (Hematological):    folic acid (FOLVITE) tablet 1 mg   Current Facility-Administered Medications (Other):    0.9 %  sodium chloride infusion (Manually program via Guardrails IV Fluids)   0.9 %  sodium chloride infusion   ascorbic acid 200mg  in dextrose 5% 107ml   Chlorhexidine Gluconate Cloth 2 % PADS 6 each   cholecalciferol (VITAMIN D3) tablet 1,000 Units   dextrose 5 % in lactated ringers infusion   dextrose 50 % solution 0-50 mL   diphenhydrAMINE-zinc acetate (BENADRYL) 2-0.1 % cream   famotidine (PEPCID) tablet 20 mg   hydrocortisone cream 1 %   loperamide (IMODIUM) capsule 2 mg   MEDLINE mouth rinse   metoCLOPramide (REGLAN) injection 5 mg   ondansetron (ZOFRAN) injection 4 mg   pantoprazole (PROTONIX) injection 40 mg   prenatal vitamin w/FE, FA (NATACHEW) chewable tablet 1 tablet   prochlorperazine (COMPAZINE) injection 10 mg   pyridOXINE (B-6) injection 100 mg   sodium chloride flush (NS) 0.9 % injection 10-40 mL   sodium chloride flush (NS) 0.9 % injection 10-40 mL   sodium chloride flush (NS) 0.9 % injection 3 mL   sodium chloride flush (NS) 0.9 % injection 3 mL   sucralfate (CARAFATE) 1 GM/10ML suspension 1 g   thiamine (B-1) injection 100 mg  No current outpatient medications on file.       Recommendations/Plan:  Fetus Continue daily NST with serial growth every 3 weeks Delivery for obstetric indications at this time.  Based on the care to date we will individualize Ms. Egolf's care however we aim to reach 34 weeks for delivery as it is not clear that earlier delivery would allow 11-13-1996 to improve care for Ms. [place], nor does the fetus appear affected by current complexities of Ms. Carcamo's care.  Maternal care  General OB care Recommend optimizing maternal care and health. Appears the majority of her care is medicine related. We would not withhold treatment for maternal care, however if the procedure would pose risk to the pregnancy we recommend indivualizing Ms. Beaupre's care and under go shared decision making with regard to possible iatrogenic preterm delivery. Delivery-Current recommendation is for delivery around 34 weeks. We understand that Ms. Roache is at risk for preterm delivery given her prior history of PTD and complexity of her current care. Delivery for obstetric indications. She is a candidate for trial of labor after cesarean delivery. T1DM with nephropathy Continue DM management with endotool would discourage discontinuation at this time given the complexity of care, Ms. Lukach's ability to manage her blood sugars with nutrition and sensitivity to insulin. Continue current fetal testing and growth surveillance. GI-  We agree with the plan for EGD to rule out organic cause of N/V and coffee ground emesis.- Pending. Regarding tube feeding- Case series suggest that tube feedings for intractable nausea and vomiting may be performed in an outpatient setting with mean days of 43 and range of 5 to 174 days with overall good outcomes. However, complications are related to aspiration, GI Bleeding, worsening reflux, esophagitis, nasal irritation and perforation. Close monitoring is recommended. If GI does not discover etiology of anemia will discuss with hospitalist additional  evaluative option, and continue keeping Ms. Everly stable from a hematological stand point.  Nutrition-  Agree with the current caloric goal and regimen. Continue monitoring essential vitamins. Heme-  Continue daily labs. Normocytic anemia with HCT 26.1 hgb 8.2.  Consider 2 PRBC prior to delivery. Avoid additoinal units of PRBC's unless Ms. Harmsen is symptomatic or hct drops below 22-23. GU-  Current renal retention is of uncertain etiology. Current work up is negative. Current drugs that are  associated with AUR include Procardia. Cardiology Heart- is stable cardiology signed off Blood pressure is stable. Musculoskeletal- Continued occupational therapy. Pulmonology- no changes.   I spent 60 minutes with > 50% in face to face with Ms. Cleon Gustin and consultation with Dr. Charlotta Newton.  Novella Olive, MD.

## 2021-01-14 NOTE — Anesthesia Preprocedure Evaluation (Signed)
Anesthesia Evaluation  Patient identified by MRN, date of birth, ID band Patient awake    Reviewed: Allergy & Precautions, H&P , NPO status , Patient's Chart, lab work & pertinent test results  Airway Mallampati: II   Neck ROM: full    Dental   Pulmonary shortness of breath,    breath sounds clear to auscultation       Cardiovascular hypertension,  Rhythm:regular Rate:Normal     Neuro/Psych PSYCHIATRIC DISORDERS Anxiety Depression  Neuromuscular disease    GI/Hepatic   Endo/Other  diabetesHypothyroidism   Renal/GU Renal InsufficiencyRenal disease     Musculoskeletal   Abdominal   Peds  Hematology  (+) Blood dyscrasia, anemia ,   Anesthesia Other Findings   Reproductive/Obstetrics                             Anesthesia Physical Anesthesia Plan  ASA: 2  Anesthesia Plan: MAC   Post-op Pain Management:    Induction: Intravenous  PONV Risk Score and Plan: 2 and Propofol infusion and Treatment may vary due to age or medical condition  Airway Management Planned: Nasal Cannula  Additional Equipment:   Intra-op Plan:   Post-operative Plan:   Informed Consent: I have reviewed the patients History and Physical, chart, labs and discussed the procedure including the risks, benefits and alternatives for the proposed anesthesia with the patient or authorized representative who has indicated his/her understanding and acceptance.     Dental advisory given  Plan Discussed with: CRNA, Anesthesiologist and Surgeon  Anesthesia Plan Comments:         Anesthesia Quick Evaluation

## 2021-01-14 NOTE — Transfer of Care (Signed)
Immediate Anesthesia Transfer of Care Note  Patient: Ashley Camacho  Procedure(s) Performed: ESOPHAGOGASTRODUODENOSCOPY (EGD) WITH PROPOFOL BIOPSY  Patient Location: PACU and Endoscopy Unit  Anesthesia Type:MAC  Level of Consciousness: awake, alert  and oriented  Airway & Oxygen Therapy: Patient Spontanous Breathing  Post-op Assessment: Report given to RN, Post -op Vital signs reviewed and stable and Patient moving all extremities  Post vital signs: Reviewed and stable  Last Vitals:  Vitals Value Taken Time  BP 131/53 01/14/21 0856  Temp 36.7 C 01/14/21 0846  Pulse 87 01/14/21 0857  Resp 24 01/14/21 0857  SpO2 94 % 01/14/21 0857  Vitals shown include unvalidated device data.  Last Pain:  Vitals:   01/14/21 0856  TempSrc:   PainSc: 0-No pain      Patients Stated Pain Goal: 5 (19/75/88 3254)  Complications: No notable events documented.

## 2021-01-14 NOTE — Progress Notes (Signed)
OT Cancellation Note  Patient Details Name: MCKENSEY BERGHUIS MRN: [id number] DOB: [birth year]   Cancelled Treatment:    Reason Eval/Treat Not Completed: Patient at procedure or test/ unavailable. Will follow.   [name] 01/14/2021, 7:57 AM Martie Round, OTR/L Acute Rehabilitation Services Pager: [phone] Office: [phone]

## 2021-01-14 NOTE — Anesthesia Procedure Notes (Signed)
Procedure Name: MAC Date/Time: 01/14/2021 8:18 AM Performed by: Leonor Liv, CRNA Pre-anesthesia Checklist: Patient identified, Emergency Drugs available, Suction available, Patient being monitored and Timeout performed Patient Re-evaluated:Patient Re-evaluated prior to induction Oxygen Delivery Method: Simple face mask Preoxygenation: Pre-oxygenation with 100% oxygen Placement Confirmation: positive ETCO2 Dental Injury: Teeth and Oropharynx as per pre-operative assessment

## 2021-01-14 NOTE — Interval H&P Note (Signed)
History and Physical Interval Note:  01/14/2021 8:17 AM  Ashley Camacho  has presented today for surgery, with the diagnosis of nausea, vomiting.  The various methods of treatment have been discussed with the patient and family. After consideration of risks, benefits and other options for treatment, the patient has consented to  Procedure(s): ESOPHAGOGASTRODUODENOSCOPY (EGD) WITH PROPOFOL (N/A) as a surgical intervention.  The patient's history has been reviewed, patient examined, no change in status, stable for surgery.  I have reviewed the patient's chart and labs.  Questions were answered to the patient's satisfaction.     Tressia Danas

## 2021-01-14 NOTE — Progress Notes (Signed)
Dr Ashok Pall notified that fhr Is reassuring for  current gestation post anesthesia.  MD also made aware of UCs which pt rates a 4 on the pain scale.  These contractions palpate mild to moderate.  Pt to return to [facility].

## 2021-01-15 DIAGNOSIS — O99013 Anemia complicating pregnancy, third trimester: Secondary | ICD-10-CM

## 2021-01-15 DIAGNOSIS — Z3A29 29 weeks gestation of pregnancy: Secondary | ICD-10-CM

## 2021-01-15 LAB — COMPREHENSIVE METABOLIC PANEL
ALT: 20 U/L (ref 0–44)
AST: 25 U/L (ref 15–41)
Albumin: 2.5 g/dL — ABNORMAL LOW (ref 3.5–5.0)
Alkaline Phosphatase: 144 U/L — ABNORMAL HIGH (ref 38–126)
Anion gap: 8 (ref 5–15)
BUN: 40 mg/dL — ABNORMAL HIGH (ref 6–20)
CO2: 21 mmol/L — ABNORMAL LOW (ref 22–32)
Calcium: 8.5 mg/dL — ABNORMAL LOW (ref 8.9–10.3)
Chloride: 110 mmol/L (ref 98–111)
Creatinine, Ser: 1.59 mg/dL — ABNORMAL HIGH (ref 0.44–1.00)
GFR, Estimated: 45 mL/min — ABNORMAL LOW (ref >60)
Glucose, Bld: 94 mg/dL (ref 70–99)
Potassium: 4.5 mmol/L (ref 3.5–5.1)
Sodium: 139 mmol/L (ref 135–145)
Total Bilirubin: 1.3 mg/dL — ABNORMAL HIGH (ref 0.3–1.2)
Total Protein: 5.5 g/dL — ABNORMAL LOW (ref 6.5–8.1)

## 2021-01-15 LAB — RETICULOCYTES
Immature Retic Fract: 19 % — ABNORMAL HIGH (ref 2.3–15.9)
RBC.: 2.75 MIL/uL — ABNORMAL LOW (ref 3.87–5.11)
Retic Count, Absolute: 78.7 10*3/uL (ref 19.0–186.0)
Retic Ct Pct: 2.9 % (ref 0.4–3.1)

## 2021-01-15 LAB — CK: Total CK: 42 U/L (ref 38–234)

## 2021-01-15 LAB — C-REACTIVE PROTEIN: CRP: 1.4 mg/dL — ABNORMAL HIGH (ref <1.0)

## 2021-01-15 LAB — CBC WITH DIFFERENTIAL/PLATELET
Abs Immature Granulocytes: 0.05 10*3/uL (ref 0.00–0.07)
Basophils Absolute: 0 10*3/uL (ref 0.0–0.1)
Basophils Relative: 0 %
Eosinophils Absolute: 0 10*3/uL (ref 0.0–0.5)
Eosinophils Relative: 0 %
HCT: 26.4 % — ABNORMAL LOW (ref 36.0–46.0)
Hemoglobin: 8.2 g/dL — ABNORMAL LOW (ref 12.0–15.0)
Immature Granulocytes: 1 %
Lymphocytes Relative: 11 %
Lymphs Abs: 0.9 10*3/uL (ref 0.7–4.0)
MCH: 29.8 pg (ref 26.0–34.0)
MCHC: 31.1 g/dL (ref 30.0–36.0)
MCV: 96 fL (ref 80.0–100.0)
Monocytes Absolute: 0.5 10*3/uL (ref 0.1–1.0)
Monocytes Relative: 5 %
Neutro Abs: 7 10*3/uL (ref 1.7–7.7)
Neutrophils Relative %: 83 %
Platelets: 252 10*3/uL (ref 150–400)
RBC: 2.75 MIL/uL — ABNORMAL LOW (ref 3.87–5.11)
RDW: 15.9 % — ABNORMAL HIGH (ref 11.5–15.5)
WBC: 8.4 10*3/uL (ref 4.0–10.5)
nRBC: 0 % (ref 0.0–0.2)

## 2021-01-15 LAB — PROTIME-INR
INR: 1 (ref 0.8–1.2)
Prothrombin Time: 13.4 seconds (ref 11.4–15.2)

## 2021-01-15 LAB — IRON AND TIBC
Iron: 113 ug/dL (ref 28–170)
Saturation Ratios: 33 % — ABNORMAL HIGH (ref 10.4–31.8)
TIBC: 346 ug/dL (ref 250–450)
UIBC: 233 ug/dL

## 2021-01-15 LAB — TECHNOLOGIST SMEAR REVIEW

## 2021-01-15 LAB — FOLATE: Folate: 66 ng/mL (ref >5.9)

## 2021-01-15 LAB — CORTISOL-AM, BLOOD: Cortisol - AM: 0.8 ug/dL — ABNORMAL LOW (ref 6.7–22.6)

## 2021-01-15 LAB — GLUCOSE, CAPILLARY: Glucose-Capillary: 93 mg/dL (ref 70–99)

## 2021-01-15 LAB — OSMOLALITY: Osmolality: 306 mOsm/kg — ABNORMAL HIGH (ref 275–295)

## 2021-01-15 LAB — MAGNESIUM: Magnesium: 2.9 mg/dL — ABNORMAL HIGH (ref 1.7–2.4)

## 2021-01-15 LAB — CERULOPLASMIN: Ceruloplasmin: 28 mg/dL (ref 19.0–39.0)

## 2021-01-15 LAB — VITAMIN C: Vitamin C: 1.6 mg/dL (ref 0.4–2.0)

## 2021-01-15 LAB — VITAMIN B12: Vitamin B-12: 497 pg/mL (ref 180–914)

## 2021-01-15 LAB — FERRITIN: Ferritin: 942 ng/mL — ABNORMAL HIGH (ref 11–307)

## 2021-01-15 LAB — TSH: TSH: 4.304 u[IU]/mL (ref 0.350–4.500)

## 2021-01-15 LAB — HAPTOGLOBIN: Haptoglobin: 101 mg/dL (ref 33–278)

## 2021-01-15 LAB — VITAMIN B1: Vitamin B1 (Thiamine): 160.3 nmol/L (ref 66.5–200.0)

## 2021-01-15 LAB — PATHOLOGIST SMEAR REVIEW

## 2021-01-15 LAB — VITAMIN B6: Vitamin B6: 13.5 ug/L (ref 3.4–65.2)

## 2021-01-15 MED ORDER — VITAMIN B-12 1000 MCG PO TABS
1000.0000 ug | ORAL_TABLET | Freq: Every day | ORAL | Status: DC
  Administered 2021-01-15 – 2021-02-19 (×33): 1000 ug via ORAL
  Filled 2021-01-15 (×38): qty 1

## 2021-01-15 MED ORDER — ALBUMIN HUMAN 25 % IV SOLN
12.5000 g | Freq: Once | INTRAVENOUS | Status: DC
  Filled 2021-01-15: qty 50

## 2021-01-15 MED ORDER — DEXTROSE IN LACTATED RINGERS 5 % IV SOLN: INTRAVENOUS | Status: DC

## 2021-01-15 MED ORDER — VITAMIN B-12 1000 MCG PO TABS: 1000.0000 ug | ORAL_TABLET | Freq: Every day | ORAL | Status: DC

## 2021-01-15 MED ORDER — ALBUMIN HUMAN 25 % IV SOLN
12.5000 g | Freq: Four times a day (QID) | INTRAVENOUS | Status: AC
Start: 2021-01-15 — End: 2021-01-15
  Administered 2021-01-15 (×2): 12.5 g via INTRAVENOUS
  Filled 2021-01-15: qty 50

## 2021-01-15 MED ORDER — COSYNTROPIN 0.25 MG IJ SOLR
0.2500 mg | Freq: Once | INTRAMUSCULAR | Status: AC
  Administered 2021-01-16: 0.25 mg via INTRAVENOUS
  Filled 2021-01-15 (×2): qty 0.25

## 2021-01-15 MED ORDER — CYANOCOBALAMIN 1000 MCG/ML IJ SOLN
1000.0000 ug | Freq: Every day | INTRAMUSCULAR | Status: DC
  Filled 2021-01-15: qty 1

## 2021-01-15 NOTE — Progress Notes (Signed)
FACULTY PRACTICE ANTEPARTUM(COMPREHENSIVE) NOTE  Ashley Camacho is a 30 y.o. G2P0101 with Estimated Date of Delivery: 03/30/21   By  LMP [redacted]w[redacted]d  who is admitted for nausea/vomiting along with DM1 complications.  Fetal presentation is cephalic. Length of Stay:  30  Days  Date of admission:12/16/2020  Subjective: Pt resting comfortably, notes feeling drowsy this am.  Reports no acute complaints overnight.  Keeping some soup down, denies episodes of vomiting.  No headache or blurry vision. Patient reports the fetal movement as  about the same . Patient reports uterine contraction  activity as none. Patient reports  vaginal bleeding as none. Patient describes fluid per vagina as None.  Vitals:  Blood pressure (!) 112/46, pulse 85, temperature 98 F (36.7 C), temperature source Oral, resp. rate 18, height 5\' 1"  (1.549 m), weight 74.2 kg, SpO2 96 %. Vitals:   01/14/21 2331 01/15/21 0440 01/15/21 0506 01/15/21 0736  BP: (!) 137/59  (!) 122/50 (!) 112/46  Pulse: 87  85 85  Resp: 19  17 18   Temp: 98.2 F (36.8 C)  98.3 F (36.8 C) 98 F (36.7 C)  TempSrc: Oral  Oral Oral  SpO2: 97%  96% 96%  Weight:  74.2 kg    Height:       Physical Examination:  General appearance - improved Mental status - alert, oriented to person, place, and time Chest - CTAB Heart - normal rate and regular rhythm Abdomen - gravid, soft and non-tender Extremities - 2+ edema, marked improvement from yesterday Skin - warm and dry Foley in place   Fetal Monitoring:  Baseline: 130 bpm, Variability: moderate, Accelerations: +10x10 accels, and Decelerations: Absent    reactive  Labs:  Results for orders placed or performed during the hospital encounter of 12/16/20 (from the past 24 hour(s))  Glucose, capillary   Collection Time: 01/15/21  4:45 AM  Result Value Ref Range   Glucose-Capillary 93 70 - 99 mg/dL  Vitamin 12/18/20   Collection Time: 01/15/21  4:53 AM  Result Value Ref Range   Vitamin B-12 497 180 - 914  pg/mL  Iron and TIBC   Collection Time: 01/15/21  4:53 AM  Result Value Ref Range   Iron 113 28 - 170 ug/dL   TIBC 01/17/21 01/17/21 - 412 ug/dL   Saturation Ratios 33 (H) 10.4 - 31.8 %   UIBC 233 ug/dL  Folate, serum, performed at [facility] lab   Collection Time: 01/15/21  4:53 AM  Result Value Ref Range   Folate 66.0 >5.9 ng/mL  CBC with Differential/Platelet   Collection Time: 01/15/21  4:53 AM  Result Value Ref Range   WBC 8.4 4.0 - 10.5 K/uL   RBC 2.75 (L) 3.87 - 5.11 MIL/uL   Hemoglobin 8.2 (L) 12.0 - 15.0 g/dL   HCT 01/17/21 (L) 01/17/21 - 72.0 %   MCV 96.0 80.0 - 100.0 fL   MCH 29.8 26.0 - 34.0 pg   MCHC 31.1 30.0 - 36.0 g/dL   RDW 94.7 (H) 09.6 - 28.3 %   Platelets 252 150 - 400 K/uL   nRBC 0.0 0.0 - 0.2 %   Neutrophils Relative % 83 %   Neutro Abs 7.0 1.7 - 7.7 K/uL   Lymphocytes Relative 11 %   Lymphs Abs 0.9 0.7 - 4.0 K/uL   Monocytes Relative 5 %   Monocytes Absolute 0.5 0.1 - 1.0 K/uL   Eosinophils Relative 0 %   Eosinophils Absolute 0.0 0.0 - 0.5 K/uL   Basophils Relative  0 %   Basophils Absolute 0.0 0.0 - 0.1 K/uL   Immature Granulocytes 1 %   Abs Immature Granulocytes 0.05 0.00 - 0.07 K/uL  Comprehensive metabolic panel   Collection Time: 01/15/21  4:53 AM  Result Value Ref Range   Sodium 139 135 - 145 mmol/L   Potassium 4.5 3.5 - 5.1 mmol/L   Chloride 110 98 - 111 mmol/L   CO2 21 (L) 22 - 32 mmol/L   Glucose, Bld 94 70 - 99 mg/dL   BUN 40 (H) 6 - 20 mg/dL   Creatinine, Ser 5.32 (H) 0.44 - 1.00 mg/dL   Calcium 8.5 (L) 8.9 - 10.3 mg/dL   Total Protein 5.5 (L) 6.5 - 8.1 g/dL   Albumin 2.5 (L) 3.5 - 5.0 g/dL   AST 25 15 - 41 U/L   ALT 20 0 - 44 U/L   Alkaline Phosphatase 144 (H) 38 - 126 U/L   Total Bilirubin 1.3 (H) 0.3 - 1.2 mg/dL   GFR, Estimated 45 (L) >60 mL/min   Anion gap 8 5 - 15  C-reactive protein   Collection Time: 01/15/21  4:53 AM  Result Value Ref Range   CRP 1.4 (H) <1.0 mg/dL  Ferritin   Collection Time: 01/15/21  4:53 AM  Result Value Ref  Range   Ferritin 942 (H) 11 - 307 ng/mL  CK   Collection Time: 01/15/21  4:53 AM  Result Value Ref Range   Total CK 42 38 - 234 U/L  Reticulocytes   Collection Time: 01/15/21  4:53 AM  Result Value Ref Range   Retic Ct Pct 2.9 0.4 - 3.1 %   RBC. 2.75 (L) 3.87 - 5.11 MIL/uL   Retic Count, Absolute 78.7 19.0 - 186.0 K/uL   Immature Retic Fract 19.0 (H) 2.3 - 15.9 %  Technologist smear review   Collection Time: 01/15/21  4:53 AM  Result Value Ref Range   WBC MORPHOLOGY MORPHOLOGY UNREMARKABLE    RBC MORPHOLOGY MORPHOLOGY UNREMARKABLE    Tech Review MORPHOLOGY UNREMARKABLE   Magnesium   Collection Time: 01/15/21  4:53 AM  Result Value Ref Range   Magnesium 2.9 (H) 1.7 - 2.4 mg/dL  TSH   Collection Time: 01/15/21  4:53 AM  Result Value Ref Range   TSH 4.304 0.350 - 4.500 uIU/mL  Cortisol-am, blood   Collection Time: 01/15/21  4:53 AM  Result Value Ref Range   Cortisol - AM 0.8 (L) 6.7 - 22.6 ug/dL  Protime-INR   Collection Time: 01/15/21  4:53 AM  Result Value Ref Range   Prothrombin Time 13.4 11.4 - 15.2 seconds   INR 1.0 0.8 - 1.2  Osmolality   Collection Time: 01/15/21  4:53 AM  Result Value Ref Range   Osmolality 306 (H) 275 - 295 mOsm/kg    Medications:  Scheduled  Chlorhexidine Gluconate Cloth  6 each Topical Daily   cholecalciferol  1,000 Units Oral Daily   cyanocobalamin  1,000 mcg Subcutaneous Q1200   vitamin B-6  12.5 mg Oral BID   And   doxylamine (Sleep)  12.5 mg Oral BID   DULoxetine  20 mg Oral Daily   enoxaparin (LOVENOX) injection  55 mg Subcutaneous Q12H   famotidine  20 mg Oral BID   feeding supplement (GLUCERNA SHAKE)  237 mL Oral BID BM   hydrALAZINE  25 mg Oral Q8H   mouth rinse  15 mL Mouth Rinse q12n4p   metoCLOPramide (REGLAN) injection  5 mg Intravenous Q6H   ondansetron (  ZOFRAN) IV  4 mg Intravenous Q8H   pantoprazole (PROTONIX) IV  40 mg Intravenous Q12H   prenatal vitamin w/FE, FA  1 tablet Oral Q1200   sodium chloride flush  10-40 mL  Intracatheter Q12H   sodium chloride flush  3 mL Intravenous Q12H   sucralfate  1 g Oral TID WC & HS   thiamine injection  100 mg Intravenous Daily   [START ON 01/18/2021] vitamin B-12  1,000 mcg Oral Daily   I have reviewed the patient's current medications.  ASSESSMENT: G2P0101 [redacted]w[redacted]d Estimated Date of Delivery: 03/30/21  ACTIVE PROBLEMS  - Nausea and vomiting  -Malnutrition  - Diabetes type 1  - Anemia in pregnancy   -AKI (acute kidney injury) (HCC)  - Recent DVT) of brachial vein of right upper extremity (HCC)  - Acute on chronic combined systolic and diastolic CHF (congestive heart failure) (HCC) - [redacted] weeks gestation of pregnancy - Urinary retention - Diarrhea/fecal incontinence  PLAN: 1) OB care -Fetal well being NST q shift, reactive/appropriate for gestational age BPP 8/25-8/8, vertex/post/AFI 28 polyhydramnios Continue BPP weekly S/p BMZ 8/23-24 -MFM following- see prior note  Current recommendation- delivery @ 34wks  2) GI Nausea/vomiting, Esophagitis  Continue scheduled Reglan/zofran/pepcid/protonix/carafate  Pt has not used additional prn medication  Currently tolerating some diet Diarrhea  Immodium prn  Plan to add probiotics     Malnutrition  On PNV daily  Dietician consulted per IM recommendation  Hypoalbuminemia- replacement ordered   3) Type 1 DM -currently on endotool -management with DM coordinator -IM managing  4) Neph/GU -UOP remains adequate -following BMP, slight increase in Cr noted -Lasix discontinued -s/p Amox for UTI, repeat culture in 1wk for TOC -hopeful for possible discontinuation of foley once pt is more mobile  5) Heme -Anemia  Fecal occult positive- GI aware, in the setting of pregnancy, will hold off on colonoscopy at this time.  However, if Hgb worsens and assessment/intervention is warranted may reconsider   Currently Hgb stable, work up to date  -h/o right brachial VTE             Lovenox 55u bid             level  followed by pharmacy  6) Cardio -HTN- currently on Hydralazine 25mg  q8hr BP low/normal for her, may need to decrease to bid; however, suspect change may occur due to d/c of Lasix, will watch for now  -s/p cardiology consult CHF stable   7) Maternal care -tylenol and oxy prn pain, hopeful to ween off oxy -encourage ambulation, PT/OT on board -discussed contraception- after much review- pt agreeable to tubal ligation as she does not desire a future pregnancy.  She has tried IUD in the past and did not like this and desires a long term option.  Consent signed and placed in chart   DISP: Plan as outlined above, supportive care with co-management per IM.  Ultimate goal delivery @ 34wks via repeat C-section and bilateral tubal sterilization should materna/fetal care remain stable  Tyrea Froberg 01/15/2021,9:37 AM

## 2021-01-15 NOTE — Progress Notes (Signed)
Pt feeling better without Cortrak and reports no vomiting since she has been able to eat so far. Reports R LE pain has resolved, but still preferring to hold IV pole with ambulation. Pt declined walking in hall with rollator, awaiting doctor. Pt much more animated and engaged this visit. Goals updated.    01/15/21 1100  OT Visit Information  Last OT Received On 01/15/21  Assistance Needed +1  History of Present Illness 30 y.o. 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 Medical history significant of [redacted]w[redacted]d on presentation, Estimated Date of Delivery: 03/30/21 by by 9 week ultrasound however trying to make it to 34w for planned c-section, Type 1 diabetic, Cardiomyopathy, chronic systolic heart failure, DVT in bilateral arm. H/o recent hospitalizations d/t hyperemesis and sepsis secondary to pneumonia.  Precautions  Precaution Comments vomiting improved, h/o R UE DVT--BPs in LEs  Pain Assessment  Pain Assessment No/denies pain  Cognition  Arousal/Alertness Awake/alert  Behavior During Therapy WFL for tasks assessed/performed  Overall Cognitive Status Within Functional Limits for tasks assessed  General Comments pt smiling and much more talkative, she is happy to have Cortrak removed and to be eating  Upper Extremity Assessment  Upper Extremity Assessment  (reports shoulder pain has mostly resolved)  ADL  Overall ADL's  Needs assistance/impaired  Eating/Feeding Independent  Grooming Brushing hair;Wash/dry hands;Wash/dry face;Standing;Supervision/safety  Upper Body Dressing  Set up;Sitting  Lower Body Dressing Set up;Sitting/lateral leans  Toilet Transfer Supervision/safety;Ambulation  Toilet Transfer Details (indicate cue type and reason) prefers to hold IV pole  Toileting- Clothing Manipulation and Hygiene Supervision/safety;Sit to/from stand  Functional mobility during ADLs  Supervision/safety (pushing IV pole)  Bed Mobility  General bed mobility comments pt at EOB  Balance  Overall balance assessment Mild deficits observed, not formally tested  Transfers  Overall transfer level Needs assistance  Equipment used None  Transfers Sit to/from Stand  Sit to Stand Supervision  General transfer comment supervision for safety/lines, pt reports she was having pain in her R LE, but none today  OT - End of Session  Activity Tolerance Patient tolerated treatment well  Patient left in bed;with call bell/phone within reach  OT Assessment/Plan  OT Plan Discharge plan remains appropriate  OT Visit Diagnosis Muscle weakness (generalized) (M62.81);Pain;Feeding difficulties (R63.3)  OT Frequency (ACUTE ONLY) Min 2X/week  Follow Up Recommendations No OT follow up;Supervision/Assistance - 24 hour  OT Equipment None recommended by OT  AM-PAC OT "6 Clicks" Daily Activity Outcome Measure (Version 2)  Help from another person eating meals? 4  Help from another person taking care of personal grooming? 3  Help from another person toileting, which includes using toliet, bedpan, or urinal? 3  Help from another person bathing (including washing, rinsing, drying)? 3  Help from another person to put on and taking off regular upper body clothing? 4  Help from another person to put on and taking off regular lower body clothing? 3  6 Click Score 20  Progressive Mobility  What is the highest level of mobility based on the progressive mobility assessment? Level 4 (Walks with assist in room) - Balance while marching in place and cannot step forward and back - Complete  Mobility Ambulated with assistance in room  OT Goal Progression  Progress towards OT goals Progressing toward goals  Acute Rehab OT Goals  Patient Stated Goal to take a shower  OT Goal Formulation With patient  Time For Goal Achievement 01/29/21  Potential  to Achieve Goals Good  ADL Goals  Pt Will Perform Grooming  standing;Independently  Pt Will Perform Lower Body Bathing Independently;sit to/from stand  Pt Will Perform Lower Body Dressing Independently;sit to/from stand  Pt Will Transfer to Toilet Independently;ambulating;regular height toilet  Pt Will Perform Toileting - Clothing Manipulation and hygiene Independently;sit to/from stand  OT Time Calculation  OT Start Time (ACUTE ONLY) 1055  OT Stop Time (ACUTE ONLY) 1112  OT Time Calculation (min) 17 min  OT General Charges  $OT Visit 1 Visit  OT Treatments  $Self Care/Home Management  8-22 mins  Martie Round, OTR/L Acute Rehabilitation Services Pager: [phone] Office: [phone]

## 2021-01-15 NOTE — Progress Notes (Signed)
Nutrition Follow-up  DOCUMENTATION CODES:  Not applicable  INTERVENTION:  Current nutrition support: CHO modified GDM diet ( 3 meals, 3 snacks) - please encourage intake  Glucerna BID (220 Kcal, 10 g protein ) Prenatal vits B12 - absorption compromised by PPI B6  Consider addition of: 25(OH)D level 15 ng/ml ( deficiency) on 7/16 - consider Vitamin D supplements Thiamine 60 mg q day- recommended due to long history of n/v  Pt should have had significant repletion of nutritional status after 1 month of tube feedings, unless there was a malabsorption issue. Unable to assess weight gain/status given edema. Albumin with 21 day half life, is acute phase reactant and pt s/p UTI all of which will lower level and its accuracy  Stool occult +, Iron status should be good given number of transfusions and 8/25 ferritin level   NUTRITION DIAGNOSIS:  Inadequate oral intake related to nausea, vomiting as evidenced by NPO status. Ongoing  GOAL:  Patient will meet greater than or equal to 90% of their needs Weight gain  MONITOR:  Diet advancement, Labs, Weight trends, TF tolerance, Skin, I & O's  REASON FOR ASSESSMENT:  History of malnutrition/hyperemesis  ASSESSMENT:  30 y.o. female with medical history of [redacted]w[redacted]d on admission , now 29 3/7 weeks , Type 1 diabetic, Cardiomyopathy, chronic systolic. Multiple recent admissions for hyperemesis/ pneumonia ( [facility] )  7/27- TF's  post-pyloric placement - discontinued 8/24 after endoscopy:erosive esophagitis  Pt asleep upon my visit. RN reports pt has not vomited today. Consumed oatmeal and coffee, 1 glucerna  Lab Results  Component Value Date   HGBA1C 5.8 (H) 11/21/2020    Diet Order:   Diet Order             Diet gestational carb mod Fluid consistency: Thin; Room service appropriate? Yes  Diet effective now                  EDUCATION NEEDS:   No education needs have been identified at this time  Skin:  Skin Assessment: Reviewed  RN Assessment  Last BM: q day  Height:   Ht Readings from Last 1 Encounters:  12/17/20 5\' 1"  (1.549 m)    Weight:   Wt Readings from Last 1 Encounters:  01/15/21 74.2 kg    Ideal Body Weight:  47.7 kg  BMI:  Body mass index is 30.91 kg/m.  Estimated Nutritional Needs:   Kcal:  1700-1900  Protein:  90-105 grams  Fluid:  > 1.7 L  01/17/21 M.M LDN Neonatal Nutrition Support Specialist/RD III

## 2021-01-15 NOTE — Progress Notes (Addendum)
[facility] Progress Note  CC:  Vomiting  Assessment / Plan: [redacted] weeks gestation of pregnancy Nausea and vomiting with associated poor po intake and malnutrition    - severe esophagitis on recent EGD ?- psychogenic vomiting overlap    - abdominal ultrasound negative for concurrent causes    - celiac serologies negative in 2017, lipase normal    - awaiting esophageal, gastric and duodenal biopsies from EGD 01/14/21    - consider Dicelgis, TCA, +/- outpatient acupuncture if sx persist despite esophagitis rx LA Class C reflux esophagitis    - continue Protonix with additions of Pepcid and Carafate    - keep the head of the bed elevated    - avoid foods associated with reflux Type 1 diabetes with associated gastoparesis    - as able to advance diet, recommended small, frequent meals low fiber/low       - separate solids and liquids.     - drink sips during meals, hydrate with 4oz of water at a time in between meals     - maximize control of diabetes    - continue Reglan and Zofran Anemia in pregnancy without overt bleeding    - occult blood expected in the setting of erosive esophagitis    - monitor for overt bleeding    - consider iron supplementation   - no indications for colonoscopy at this time, consider if anemia persists despite iron supplementation, worsens, or with over bleeding Gallbladder sludge    - symptoms atypical for symptomatic gallbladder disease  The inpatient GI team will move to stand-by. Please call the on-call gastroenterologist with any additional questions or concerns during this hospitalization.   Subjective: Feeling better without the Cortrak. Eating oatmeal at the time of my evaluation without any nausea or vomiting since her endoscopy yesterday. No knew GI complaints today. No family present at the time of my evaluation.   Objective:  Vital signs in last 24 hours: Temp:  [98 F (36.7 C)-98.6 F (37 C)] 98.1 F (36.7 C) (08/25  1133) Pulse Rate:  [81-87] 81 (08/25 1133) Resp:  [17-19] 18 (08/25 1133) BP: (112-137)/(46-62) 130/57 (08/25 1133) SpO2:  [95 %-100 %] 100 % (08/25 1133) Weight:  [74.2 kg] 74.2 kg (08/25 0440) Last BM Date: 01/15/21 General:   Alert, in NAD, sitting upright in bed eating lunch Heart:  Regular rate and rhythm; no murmurs Pulm: Clear anteriorly; no wheezing Abdomen:  Soft. Gravid. Nontender. Nondistended. Normal bowel sounds. No rebound or guarding. LAD: No inguinal or umbilical LAD Extremities:  2+ LE edema Neurologic:  Alert and  oriented x4;  grossly normal neurologically. Psych:  Alert and cooperative. Brighter affect today.    Lab Results: Recent Labs    01/13/21 0509 01/14/21 0518 01/15/21 0453  WBC 5.0 8.0 8.4  HGB 8.2* 8.9* 8.2*  HCT 26.1* 27.4* 26.4*  PLT 207 218 252   BMET Recent Labs    01/13/21 0509 01/14/21 0518 01/15/21 0453  NA 135 138 139  K 4.6 4.5 4.5  CL 109 110 110  CO2 21* 22 21*  GLUCOSE 109* 142* 94  BUN 35* 39* 40*  CREATININE 1.34* 1.49* 1.59*  CALCIUM 8.3* 8.4* 8.5*   LFT Recent Labs    01/15/21 0453  PROT 5.5*  ALBUMIN 2.5*  AST 25  ALT 20  ALKPHOS 144*  BILITOT 1.3*   PT/INR Recent Labs    01/15/21 0453  LABPROT 13.4  INR 1.0   Abdominal ultrasound  01/14/21: IMPRESSION: 1. Moderate right hydronephrosis, similar to prior study. 2. Small amount of biliary sludge within the gallbladder. No sonographic evidence cholecystitis. 3. Bilateral pleural effusions.    LOS: 30 days   Tressia Danas  01/15/2021, 12:56 PM

## 2021-01-15 NOTE — Progress Notes (Signed)
Inpatient Diabetes Program Recommendations  AACE/ADA: New Consensus Statement on Inpatient Glycemic Control (2015)  Target Ranges:  Prepandial:   less than 140 mg/dL      Peak postprandial:   less than 180 mg/dL (1-2 hours)      Critically ill patients:  140 - 180 mg/dL   Lab Results  Component Value Date   GLUCAP 93 01/15/2021   HGBA1C 5.8 (H) 11/21/2020    Review of Glycemic Control Results for Ashley Camacho, Ashley Camacho (MRN [id number]) as of 01/15/2021 10:38  Ref. Range 01/15/2021 04:45  Glucose-Capillary Latest Ref Range: 70 - 99 mg/dL 93  Diabetes history: DM 1 Outpatient Diabetes medications: Omnipod with Novolog insulin (see settings below) Current orders for Inpatient glycemic control:  IV insulin Inpatient Diabetes Program Recommendations:    Spoke to RN. She states patient is feeling better today.  She is eating some.  Will continue to follow.   Thanks  Beryl Meager, RN, BC-ADM Inpatient Diabetes Coordinator Pager [phone]  (8a-5p)

## 2021-01-15 NOTE — Anesthesia Postprocedure Evaluation (Signed)
Anesthesia Post Note  Patient: Ashley Camacho  Procedure(s) Performed: ESOPHAGOGASTRODUODENOSCOPY (EGD) WITH PROPOFOL BIOPSY     Patient location during evaluation: Endoscopy Anesthesia Type: MAC Level of consciousness: awake and alert Pain management: pain level controlled Vital Signs Assessment: post-procedure vital signs reviewed and stable Respiratory status: spontaneous breathing, nonlabored ventilation, respiratory function stable and patient connected to nasal cannula oxygen Cardiovascular status: stable and blood pressure returned to baseline Postop Assessment: no apparent nausea or vomiting Anesthetic complications: no   No notable events documented.  Last Vitals:  Vitals:   01/15/21 1532 01/15/21 1732  BP: (!) 131/56 (!) 137/55  Pulse: 86 82  Resp: 18 18  Temp: 36.8 C 36.7 C  SpO2: 96% 98%    Last Pain:  Vitals:   01/15/21 1732  TempSrc: Oral  PainSc:    Pain Goal: Patients Stated Pain Goal: 5 (01/14/21 1955)                 [place]

## 2021-01-15 NOTE — Progress Notes (Signed)
[facility] Progress Note  Patient: Ashley Camacho    TDD:[id number]  DOA: 12/16/2020     Date of Service: the patient was seen and examined on 01/15/2021  Brief hospital course: 30 year old female with past medical history of type 1 diabetes, cardiomyopathy, chronic systolic CHF who is 24 weeks 4 days pregnant on presentation.  Presents with complaints of intractable nausea and vomiting. 6/30 admission to [facility] for nausea and vomiting. 7/4-7/20 transferred to [facility] for pneumonia.  Due to rhinovirus. 7/22-7/26 admitted to [facility] again with starvation ketosis, also DVT of brachial vein.  Also started on TPN, PICC line inserted. 7/26 admitted to [facility] 7/27 cardiology consulted for cardiomyopathy, EF visit transfuse, PICC line inserted, tube feeding started. 7/29 having recurrent retention, Foley catheter inserted 7/30 PRBC transfused 8/5 PRBC transfused 8/16 cardiology signed off, no follow-up recommended 8/19 nephrology consulted, no follow-up recommended 8/20 PRBC transfused 8/21 rescue course of betamethasone 8/22 Foley catheter reinserted after removal due to retention. 8/23 GI consulted. MFM consulted, nephrology signed off. 8/24 underwent EGD shows severe esophagitis with mild oozing.  Dobbhoff removed patient on oral diet.  Currently plan is supportive care for anemia, improvement in oral, monitor renal function and ensure that the patient is able to 34 weeks.  Subjective: No vomiting.  Reports indigestion.  No nausea as well.  No chest pain no abdominal pain.  Breathing okay.  No diarrhea.  Not passing gas.  Oral intake improving.  Assessment and Plan: 1.  Adult failure to thrive Moderate protein calorie malnutrition, now no more  malnourished. Consult dietary. Patient was on TPN followed by tube feedings followed by now regular diet.  Will add nutritional supplements. Continue multivitamins. Dietitian consulted.   2.  Intractable nausea  and vomiting. Food aversion. Gastroparesis  Hyperemesis gravidarum EGD performed.  Shows esophagitis. Do not think the patient is suffering from gastroparesis.  GI also agrees. Patient is on scheduled Reglan and Zofran. Continue PPI and Pepcid. Having bowel movements thus less likely obstruction. Ultrasound abdomen shows no significant abnormality related to liver or pancreas. Was on Remeron that did not work for improvement in oral intake. Initial plan was to continue core track until delivery at 34 weeks.  Now removed and patient is able to tolerate p.o. diet. For ingestion currently on PPI and Pepcid both.   3.  Normocytic anemia Likely multifactorial. Out of proportion to anemia of pregnancy. Based on hemoglobin around 10.  Started in May with hemoglobin around 7.  Hematology has evaluated patient felt that this might be associated with iron deficiency. Extensive work-up so far has been performed which includes hemoglobin electrophoresis, nutritional work-up as well as pathologist smear review. Copper level normal. B12 level is relatively low now normal. 8/20 5 repeat work-up shows normal LFTs, normal iron, normal folic acid level, B12 497 and stable hemoglobin.  No active bleeding reported. FOBT positive.  Haptoglobin and LDH normal. Not a candidate for colonoscopy in the setting of ongoing pregnancy. Given that the patient is not having ongoing massive bleeding, work-up can wait until she delivers. Continue supportive measures for transfusion.   4.  Nutritional deficiency. B12 deficiency Replaced extensively.  Switching to p.o. multivitamins although given reduction of the B12 level will provide subcutaneous injections for 3 days. Dietary consultation.   5.  Type 1 diabetes mellitus with hyperand hypoglycemia on insulin pump with long-term insulin use with potential neuropathy complication Patient is on insulin pump at home.  Currently on IV insulin.  Monitor.  On D5 LR to  support IV insulin.   6.  UTI. Completed 7 days of antibiotics.   7.  Acute on chronic systolic CHF Baseline EF 45%  Presents with volume overload. Treated with IV diuresis under cardiology consult. Repeat echocardiogram shows significant improvement.   8.  Hypoalbuminemia. Third spacing. Patient's albumin level has been running low. Work-up so far shows that patient has bilateral lower extremity edema, pericardial effusion, pleural effusion. ANA and dsDNA are negative. Pericardial effusion not significant enough to cause any symptoms. Will require albumin support for future diagnosis.   9.  Acute kidney injury. Urinary retention Likely hemodynamically mediated. Nephrology was consulted. Patient has mild proteinuria. Ultrasound shows right renal hydronephrosis which appears to be physiologic in the setting of pregnancy. Serologies were negative including HIV, HCV, HBV, C3-C4, and dsDNA. Currently creatinine is trending upwards.  Hold IV diuresis, provide IV albumin and monitor. Continue Foley catheter for now, likely will discontinue tomorrow pending repeat creatinine.   10.  Pregnancy. Currently on 29-week. C-section at 34 weeks. Received steroids for lung maturity. Monitor.   11.  Right upper extremity DVT. On therapeutic anticoagulation.  Will continue only 77-month of anticoagulation.  12.  Low cortisol level. AM cortisol level is 0.6. While this level is diagnostic for adrenal insufficiency, patient Received IV steroids few days ago.  We will confirm with cosyntropin stimulation test.  Scheduled Meds:  Chlorhexidine Gluconate Cloth  6 each Topical Daily   [START ON 01/16/2021] cosyntropin  0.25 mg Intravenous Once   vitamin B-6  12.5 mg Oral BID   And   doxylamine (Sleep)  12.5 mg Oral BID   DULoxetine  20 mg Oral Daily   enoxaparin (LOVENOX) injection  55 mg Subcutaneous Q12H   famotidine  20 mg Oral BID   feeding supplement (GLUCERNA SHAKE)  237 mL Oral BID BM    hydrALAZINE  25 mg Oral Q8H   mouth rinse  15 mL Mouth Rinse q12n4p   metoCLOPramide (REGLAN) injection  5 mg Intravenous Q6H   ondansetron (ZOFRAN) IV  4 mg Intravenous Q8H   pantoprazole (PROTONIX) IV  40 mg Intravenous Q12H   prenatal vitamin w/FE, FA  1 tablet Oral Q1200   sodium chloride flush  10-40 mL Intracatheter Q12H   sodium chloride flush  3 mL Intravenous Q12H   sucralfate  1 g Oral TID WC & HS   vitamin B-12  1,000 mcg Oral Daily   Continuous Infusions:  sodium chloride Stopped (01/11/21 2000)   dextrose 5% lactated ringers     insulin 1.5 Units/hr (01/15/21 1730)   promethazine (PHENERGAN)25mg  in 10mL NS 25 mg (01/08/21 2252)   PRN Meds: sodium chloride, acetaminophen, dextrose, diphenhydrAMINE-zinc acetate, hydrocortisone cream, loperamide, oxyCODONE, prochlorperazine, promethazine (PHENERGAN)25mg  in 61mL NS, sodium chloride, sodium chloride flush, sodium chloride flush  Body mass index is 30.91 kg/m.  Nutrition Problem: Inadequate oral intake Etiology: nausea, vomiting     DVT Prophylaxis: Therapeutic Anticoagulation with Lovenox       Advance goals of care discussion: Pt is Full code.  Family Communication: no family was present at bedside, at the time of interview.   Data Reviewed: I have personally reviewed and interpreted daily labs, tele strips, imaging. Hemoglobin 8.2, electrolytes stable, serum creatinine trending up.  Physical Exam:  General: Appear in mild distress, no Rash; Oral Mucosa Clear, moist. no Abnormal Neck Mass Or lumps, Conjunctiva normal  Cardiovascular: S1 and S2 Present, no Murmur, Respiratory: good respiratory effort, Bilateral Air entry present  and CTA, no Crackles, no wheezes Abdomen: Bowel Sound present, Soft and no tenderness Extremities: bilateral  Pedal edema Neurology: alert and oriented to time, place, and person affect appropriate. no new focal deficit Gait not checked due to patient safety concerns    Vitals:    01/15/21 1133 01/15/21 1413 01/15/21 1532 01/15/21 1732  BP: (!) 130/57 (!) 143/66 (!) 131/56 (!) 137/55  Pulse: 81 91 86 82  Resp: 18  18 18   Temp: 98.1 F (36.7 C)  98.3 F (36.8 C) 98.1 F (36.7 C)  TempSrc: Oral  Oral Oral  SpO2: 100%  96% 98%  Weight:      Height:        Disposition:  Status is: Inpatient  Remains inpatient appropriate because:IV treatments appropriate due to intensity of illness or inability to take PO and Inpatient level of care appropriate due to severity of illness  Dispo: The patient is from: Home              Anticipated d/c is to: Home              Patient currently is not medically stable to d/c.   Difficult to place patient No  Time spent: 35 minutes. I reviewed all nursing notes, pharmacy notes, vitals, pertinent old records. I have discussed plan of care as described above with RN.  Author: , MD [facility] 01/15/2021 5:59 PM  To reach On-call, see care teams to locate the attending and reach out via [email or web address]. Between 7PM-7AM, please contact night-coverage If you still have difficulty reaching the attending provider, please page the [facility] (Director on Call) for [facility] on amion for assistance.

## 2021-01-16 ENCOUNTER — Encounter (HOSPITAL_COMMUNITY): Payer: Self-pay | Admitting: Gastroenterology

## 2021-01-16 LAB — BASIC METABOLIC PANEL
Anion gap: 9 (ref 5–15)
BUN: 33 mg/dL — ABNORMAL HIGH (ref 6–20)
CO2: 20 mmol/L — ABNORMAL LOW (ref 22–32)
Calcium: 8.4 mg/dL — ABNORMAL LOW (ref 8.9–10.3)
Chloride: 110 mmol/L (ref 98–111)
Creatinine, Ser: 1.37 mg/dL — ABNORMAL HIGH (ref 0.44–1.00)
GFR, Estimated: 54 mL/min — ABNORMAL LOW (ref >60)
Glucose, Bld: 127 mg/dL — ABNORMAL HIGH (ref 70–99)
Potassium: 4.4 mmol/L (ref 3.5–5.1)
Sodium: 139 mmol/L (ref 135–145)

## 2021-01-16 LAB — ACTH STIMULATION, 3 TIME POINTS
Cortisol, 30 Min: 4.6 ug/dL
Cortisol, 60 Min: 6.5 ug/dL
Cortisol, Base: 1.3 ug/dL

## 2021-01-16 LAB — MAGNESIUM: Magnesium: 2.5 mg/dL — ABNORMAL HIGH (ref 1.7–2.4)

## 2021-01-16 LAB — HEPARIN ANTI-XA: Heparin LMW: 0.53 IU/mL

## 2021-01-16 LAB — SURGICAL PATHOLOGY

## 2021-01-16 LAB — VITAMIN D 25 HYDROXY (VIT D DEFICIENCY, FRACTURES): Vit D, 25-Hydroxy: 34.88 ng/mL (ref 30–100)

## 2021-01-16 LAB — COPPER, SERUM: Copper: 103 ug/dL (ref 80–158)

## 2021-01-16 LAB — GLUCOSE, CAPILLARY: Glucose-Capillary: 79 mg/dL (ref 70–99)

## 2021-01-16 MED ORDER — VITAMIN D 25 MCG (1000 UNIT) PO TABS
1000.0000 [IU] | ORAL_TABLET | Freq: Every day | ORAL | Status: DC
  Administered 2021-01-16 – 2021-03-04 (×46): 1000 [IU] via ORAL
  Filled 2021-01-16 (×48): qty 1

## 2021-01-16 MED ORDER — ONDANSETRON 4 MG PO TBDP
4.0000 mg | ORAL_TABLET | Freq: Three times a day (TID) | ORAL | Status: DC | PRN
  Administered 2021-01-16 – 2021-02-01 (×3): 4 mg via ORAL
  Filled 2021-01-16 (×3): qty 1

## 2021-01-16 MED ORDER — HYDROCORTISONE NA SUCCINATE PF 100 MG IJ SOLR
50.0000 mg | Freq: Two times a day (BID) | INTRAMUSCULAR | Status: DC
  Administered 2021-01-16 – 2021-01-20 (×9): 50 mg via INTRAVENOUS
  Filled 2021-01-16 (×11): qty 1

## 2021-01-16 MED ORDER — ENOXAPARIN SODIUM 60 MG/0.6ML IJ SOSY
60.0000 mg | PREFILLED_SYRINGE | Freq: Two times a day (BID) | INTRAMUSCULAR | Status: DC
  Administered 2021-01-16 – 2021-01-17 (×2): 60 mg via SUBCUTANEOUS
  Filled 2021-01-16 (×2): qty 0.6

## 2021-01-16 NOTE — Progress Notes (Signed)
FACULTY PRACTICE ANTEPARTUM(COMPREHENSIVE) NOTE  Ashley Camacho is a 30 y.o. G2P0101 with Estimated Date of Delivery: 03/30/21   By  LMP [redacted]w[redacted]d  who is admitted for nausea/vomiting, DM1 complications in pregnancy.    Fetal presentation is cephalic. Length of Stay:  31  Days  Date of admission:12/16/2020  Subjective:  Resting comfortably in bed, states she feels about the same as yesterday.  No nausea or vomiting.  Tolerating diet, ate meatloaf/mashed potatoes last night without problems.  Notes some lower constant chest pain.  Denies SOB or difficulty breathing.  Points to her sternum as location of pain.  Leg pain has now resolved. Patient reports the fetal movement as  the same- present but not very active . Patient reports uterine contraction  activity as none. Patient reports  vaginal bleeding as none. Patient describes fluid per vagina as None.  Vitals:  Blood pressure (!) 141/65, pulse 78, temperature 98.1 F (36.7 C), temperature source Oral, resp. rate 18, height 5\' 1"  (1.549 m), weight 72.1 kg, SpO2 98 %. Vitals:   01/15/21 2320 01/16/21 0437 01/16/21 0500 01/16/21 0803  BP: (!) 143/67 134/66  (!) 141/65  Pulse: 84 84  78  Resp: 18 18  18   Temp: 98 F (36.7 C) 98.1 F (36.7 C)  98.1 F (36.7 C)  TempSrc: Oral Oral  Oral  SpO2: 97% 96%  98%  Weight:   72.1 kg   Height:       Physical Examination:  General appearance - alert, well appearing, and in no distress Mental status - normal mood, behavior, speech, dress, motor activity, and thought processes Chest - CTAB Heart - normal rate and regular rhythm Abdomen - gravid, soft and non-tender Extremities - 2+ edema bilaterally- same as yesterday, no calf tenderness bilaterally Skin - warm and dry   Fetal Monitoring:  Baseline: 130 bpm, Variability: moderate, Accelerations: +15x15 accels, and Decelerations: Absent    reactive  Labs:  Results for orders placed or performed during the hospital encounter of 12/16/20 (from  the past 24 hour(s))  Glucose, capillary   Collection Time: 01/16/21  4:59 AM  Result Value Ref Range   Glucose-Capillary 79 70 - 99 mg/dL  ACTH stimulation, 3 time points   Collection Time: 01/16/21  6:30 AM  Result Value Ref Range   Cortisol, Base 1.3 ug/dL   Cortisol, 30 Min 4.6 ug/dL   Cortisol, 60 Min 6.5 ug/dL    Medications:  Scheduled  Chlorhexidine Gluconate Cloth  6 each Topical Daily   vitamin B-6  12.5 mg Oral BID   And   doxylamine (Sleep)  12.5 mg Oral BID   DULoxetine  20 mg Oral Daily   enoxaparin (LOVENOX) injection  55 mg Subcutaneous Q12H   famotidine  20 mg Oral BID   feeding supplement (GLUCERNA SHAKE)  237 mL Oral BID BM   hydrALAZINE  25 mg Oral Q8H   mouth rinse  15 mL Mouth Rinse q12n4p   metoCLOPramide (REGLAN) injection  5 mg Intravenous Q6H   pantoprazole (PROTONIX) IV  40 mg Intravenous Q12H   prenatal vitamin w/FE, FA  1 tablet Oral Q1200   sodium chloride flush  10-40 mL Intracatheter Q12H   sodium chloride flush  3 mL Intravenous Q12H   sucralfate  1 g Oral TID WC & HS   vitamin B-12  1,000 mcg Oral Daily   I have reviewed the patient's current medications.  ASSESSMENT: 01/18/21 [redacted]w[redacted]d Estimated Date of Delivery: 03/30/21    -  Nausea and vomiting  -Malnutrition  - Diabetes type 1  - Anemia in pregnancy   -AKI (acute kidney injury) (HCC)  - Recent DVT) of brachial vein of right upper extremity (HCC)  - Acute on chronic combined systolic and diastolic CHF (congestive heart failure) (HCC) - [redacted] weeks gestation of pregnancy - Urinary retention   PLAN: 1) OB care -Fetal well being  NST reactive, Cat I tracing, continue q shift monitoring  BPP weekly, last completed 8/24  Growth q 4 wks, last completed 8/22  S/p BMZ 8/23-24 -MFM following- see prior note  2) GI Nausea/vomiting/esophagitis  Tolerating carb modified diet  Plan to slowly transition IV anti-emetics to oral  Continue pepcid, protonix, carafate Diarrhea- improved, pt  reports no BM overnight Malnutrition  Dietician on board  B12 replacement per IM  On daily vitamin  3) ENDO Type 1 DM  On endo tool  Reviewed with DM coordinator, if pt continue to tolerate regular diet, will plan to transition to insulin pump Monday  Low cortisol  Concern for adrenal insuff, testing today, management per IM  4) Neph/GU -UOP remains adequate -hesitant to remove foley due to prior requirement of re-insertion, will continue foley at this time -s/p Amox for UTI, will repeat culture next week  5) Heme -Anemia             Work up per IM Fecal occult positive- GI aware, in the setting of pregnancy, will hold off on colonoscopy at this time.  If Hgb worsens and assessment/intervention is warranted may reconsider  Labs pending this am               -h/o right brachial VTE             Lovenox 55u bid             level followed by pharmacy   6) Cardio -HTN- currently on Hydralazine 25mg  q8hr BP low/normal for her, may need to decrease to bid; however, suspect change may occur due to d/c of Lasix, will watch for now   -s/p cardiology consult CHF stable   7) Maternal care -tylenol prn, leg pain resolved, oxycodone discontinued -encourage ambulation, PT/OT on board     DISP: Plan as outlined above, continue with supportive care with co-management per IM.  Goal for delivery @ 34wk via Repeat C-section and bilateral tubal sterilization if maternal/fetal well being remains stable  Briawna Carver 01/16/2021,9:44 AM

## 2021-01-16 NOTE — Progress Notes (Signed)
[facility] Progress Note  Patient: Ashley Camacho    NOM:[id number]  DOA: 12/16/2020     Date of Service: the patient was seen and examined on 01/16/2021  Brief hospital course: 30 year old female with past medical history of type 1 diabetes, cardiomyopathy, chronic systolic CHF who is 24 weeks 4 days pregnant on presentation.  Presents with complaints of intractable nausea and vomiting. 6/30 admission to [facility] for nausea and vomiting. 7/4-7/20 transferred to [facility] for pneumonia.  Due to rhinovirus. 7/22-7/26 admitted to [facility] again with starvation ketosis, also DVT of brachial vein.  Also started on TPN, PICC line inserted. 7/26 admitted to [facility] 7/27 cardiology consulted for cardiomyopathy, EF visit transfuse, PICC line inserted, tube feeding started. 7/29 having recurrent retention, Foley catheter inserted 7/30 PRBC transfused 8/5 PRBC transfused 8/16 cardiology signed off, no follow-up recommended 8/19 nephrology consulted, no follow-up recommended 8/20 PRBC transfused 8/21 rescue course of betamethasone 8/22 Foley catheter reinserted after removal due to retention. 8/23 GI consulted. MFM consulted, nephrology signed off. 8/24 underwent EGD shows severe esophagitis with mild oozing.   Dobbhoff removed patient on oral diet. 8/25 PICC line removed. 8/26 started on steroids for adrenal insufficiency.  Currently plan is monitor for improvement in oral intake, monitor renal function, supportive care for anemia and ensure that the pregnancy is carried to 34 weeks.  Subjective: No vomiting but continues to have some indigestion.  No nausea as well. Not passing gas.  No abdominal pain.  Has intermittent contraction. No shortness of breath no cough.  Assessment and Plan: 1.  Adult failure to thrive Moderate protein calorie malnutrition, now no more  malnourished. Consult dietary. Patient was on TPN followed by tube feedings followed by now regular  diet.  Will add Glucerna Continue multivitamins.  Switch to p.o. Dietitian consulted.   2.  Intractable nausea and vomiting. Food aversion. Gastroparesis  Hyperemesis gravidarum EGD performed.  Shows esophagitis. Do not think the patient is suffering from gastroparesis.  GI also agrees. Patient is on scheduled Reglan and Zofran. Continue PPI and Pepcid. Patient was initially having bowel movements.  Now not passing gas after receiving Imodium. Does not exhibit any obstruction therefore no concern for further work-up but we will discontinue Imodium. Ultrasound abdomen shows no significant abnormality related to liver or pancreas. Was on Remeron that did not work for improvement in oral intake. Initial plan was to continue core track until delivery at 34 weeks.  Now removed and patient is able to tolerate p.o. diet. For ingestion currently on PPI and Pepcid both.   3.  Normocytic anemia Likely multifactorial. Out of proportion to anemia of pregnancy. Based on hemoglobin around 10.  Started in May with hemoglobin around 7.  Hematology has evaluated patient felt that this might be associated with iron deficiency. Extensive work-up so far has been performed which includes hemoglobin electrophoresis, nutritional work-up as well as pathologist smear review. Copper level normal. B12 level is relatively low now normal. 8/20 5 repeat work-up shows normal LFTs, normal iron, normal folic acid level, B12 497 and stable hemoglobin.  No active bleeding reported. FOBT positive.  Haptoglobin and LDH normal. Not a candidate for colonoscopy in the setting of ongoing pregnancy. Given that the patient is not having ongoing massive bleeding, work-up can wait until she delivers. Continue supportive measures for transfusion.   4.  Nutritional deficiency. B12 deficiency Replaced extensively.  Switching to p.o. multivitamins although given reduction of the B12 level will provide subcutaneous injections for  3  days. Dietary consultation.   5.  Type 1 diabetes mellitus with hyperand hypoglycemia on insulin pump with long-term insulin use with potential neuropathy complication Patient is on insulin pump at home.  Currently on IV insulin.  Monitor. On D5 LR to support IV insulin.   6.  UTI. Completed 7 days of antibiotics.   7.  Acute on chronic systolic CHF Baseline EF 45%  Presents with volume overload. Treated with IV diuresis under cardiology consult. Repeat echocardiogram shows significant improvement.   8.  Hypoalbuminemia. Third spacing. Patient's albumin level has been running low. Work-up so far shows that patient has bilateral lower extremity edema, pericardial effusion, pleural effusion. ANA and dsDNA are negative. Pericardial effusion not significant enough to cause any symptoms. Will require albumin support for future diuresis. Currently volume level is adequate therefore holding off on diuresis.   9.  Acute kidney injury. Urinary retention Likely hemodynamically mediated. Nephrology was consulted. Patient has mild proteinuria. Ultrasound shows right renal hydronephrosis which appears to be physiologic in the setting of pregnancy. Serologies were negative including HIV, HCV, HBV, C3-C4, and dsDNA. Currently creatinine is trending upwards.  Hold IV diuresis, provide IV albumin and monitor. Continue Foley catheter for weekend   10.  Pregnancy. Currently on 29-week. C-section at 34 weeks. Received steroids for lung maturity. Monitor.   11.  Right upper extremity DVT. On therapeutic anticoagulation.  Will continue only 63-month of anticoagulation.  12.  Adrenal insufficiency. AM cortisol level is 0.6. While this level is diagnostic for adrenal insufficiency, patient Received IM steroids few days ago. Cosyntropin stimulation test was performed and levels did not improve significantly.  1.3 t-4.6-6.5. We will initiate IV Solu-Cortef for 2 doses. Will perform further  work-up for other hormonal deficiency. Outpatient follow-up with endocrinologist for detailed adrenal insufficiency work-up but  Scheduled Meds:  cholecalciferol  1,000 Units Oral Daily   vitamin B-6  12.5 mg Oral BID   And   doxylamine (Sleep)  12.5 mg Oral BID   DULoxetine  20 mg Oral Daily   enoxaparin (LOVENOX) injection  60 mg Subcutaneous Q12H   famotidine  20 mg Oral BID   feeding supplement (GLUCERNA SHAKE)  237 mL Oral BID BM   hydrALAZINE  25 mg Oral Q8H   hydrocortisone sod succinate (SOLU-CORTEF) inj  50 mg Intravenous Q12H   mouth rinse  15 mL Mouth Rinse q12n4p   metoCLOPramide (REGLAN) injection  5 mg Intravenous Q6H   pantoprazole (PROTONIX) IV  40 mg Intravenous Q12H   prenatal vitamin w/FE, FA  1 tablet Oral Q1200   sucralfate  1 g Oral TID WC & HS   vitamin B-12  1,000 mcg Oral Daily   Continuous Infusions:  dextrose 5% lactated ringers 50 mL/hr at 01/16/21 0204   insulin 1.6 Units/hr (01/16/21 1840)   promethazine (PHENERGAN)25mg  in 49mL NS 25 mg (01/08/21 2252)   PRN Meds: acetaminophen, dextrose, diphenhydrAMINE-zinc acetate, hydrocortisone cream, ondansetron, prochlorperazine, promethazine (PHENERGAN)25mg  in 53mL NS, sodium chloride  Body mass index is 30.03 kg/m.  Nutrition Problem: Inadequate oral intake Etiology: nausea, vomiting     DVT Prophylaxis: Therapeutic Anticoagulation with Lovenox       Advance goals of care discussion: Pt is Full code.  Family Communication: no family was present at bedside, at the time of interview.   Data Reviewed: I have personally reviewed and interpreted daily labs, tele strips, imaging. Serum creatinine improved after IV albumin support.  Physical Exam: General: Appear in mild distress, no Rash;  Oral Mucosa Clear, moist. no Abnormal Neck Mass Or lumps, Conjunctiva normal  Cardiovascular: S1 and S2 Present, no Murmur, Respiratory: good respiratory effort, Bilateral Air entry present and CTA, no Crackles, no  wheezes Abdomen: Bowel Sound present, Soft and no tenderness Extremities: bilateral  Pedal edema Neurology: alert and oriented to time, place, and person affect appropriate. no new focal deficit Gait not checked due to patient safety concerns   Vitals:   01/16/21 0803 01/16/21 1150 01/16/21 1434 01/16/21 1603  BP: (!) 141/65 139/61 (!) 143/56 (!) 141/60  Pulse: 78 85  84  Resp: 18 17  18   Temp: 98.1 F (36.7 C) 98.5 F (36.9 C)  98.2 F (36.8 C)  TempSrc: Oral Oral  Oral  SpO2: 98% 95%  99%  Weight:      Height:        Disposition:  Status is: Inpatient  Remains inpatient appropriate because:IV treatments appropriate due to intensity of illness or inability to take PO and Inpatient level of care appropriate due to severity of illness  Dispo: The patient is from: Home              Anticipated d/c is to: Home              Patient currently is not medically stable to d/c.   Difficult to place patient No  Time spent: 35 minutes. I reviewed all nursing notes, pharmacy notes, vitals, pertinent old records. I have discussed plan of care as described above with RN.  Author: , MD [facility] 01/16/2021 7:22 PM  To reach On-call, see care teams to locate the attending and reach out via [email or web address]. Between 7PM-7AM, please contact night-coverage If you still have difficulty reaching the attending provider, please page the [facility] (Director on Call) for [facility] on amion for assistance.

## 2021-01-16 NOTE — Progress Notes (Addendum)
Physical Therapy Treatment Patient Details Name: Ashley Camacho MRN: [id number] DOB: 10/02/1990 Today's Date: 01/16/2021    History of Present Illness 30 y.o. 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 Medical history significant of [redacted]w[redacted]d on presentation, Estimated Date of Delivery: 03/30/21 by by 9 week ultrasound however trying to make it to 34w for planned c-section, Type 1 diabetic, Cardiomyopathy, chronic systolic heart failure, DVT in bilateral arm. H/o recent hospitalizations d/t hyperemesis and sepsis secondary to pneumonia.    PT Comments    Pt walked with encouragement today.  She did not feel well.  I educated her on the importance of walking even when she didn't feel well (especially as she reports feeling constipated today) as it is important for her overall health.  She reports abdominal pain, sternal pain, and less, but still present R leg pain.  Pt remains appropriate for PT, but will likely need to be pushed to do more (I asked if we could walk a second lap today and she declined).   Goals reviewed and updated today.  Follow Up Recommendations  No PT follow up     Equipment Recommendations  None recommended by PT    Recommendations for Other Services       Precautions / Restrictions Precautions Precaution Comments: vomiting/nausea precautions (bring emesis bag), h/o R UE DVT--BPs in LEs    Mobility  Bed Mobility               General bed mobility comments: pt at EOB    Transfers Overall transfer level: Needs assistance Equipment used: 1 person hand held assist Transfers: Sit to/from Stand Sit to Stand: Min guard         General transfer comment: Min guard assist for safety due to slow speed and some rocking to come up to standing.  Ambulation/Gait Ambulation/Gait assistance: Supervision Gait Distance (Feet): 130 Feet Assistive device:  4-wheeled walker Gait Pattern/deviations: Step-through pattern;Wide base of support;Antalgic Gait velocity: decr Gait velocity interpretation: <1.31 ft/sec, indicative of household ambulator General Gait Details: less R LE pain today, but still mildly antalgic and pt reporting R leg goes numb when she sits on the edge of the rollator.  One seated rest break, really needed encouragement to go into the hallway and education that walking, even when feeling back can help with (constipation specifically today) overall health.   Stairs             Wheelchair Mobility    Modified Rankin (Stroke Patients Only)       Balance Overall balance assessment: Mild deficits observed, not formally tested                                          Cognition Arousal/Alertness: Awake/alert Behavior During Therapy: Flat affect (a little more flat today than previous session) Overall Cognitive Status: Within Functional Limits for tasks assessed                                 General Comments: flatter affect than last session, but generally improved.  She vomited prior to PT session and I think she was bummed about it as it has been a while since she had an episode.      Exercises  General Comments        Pertinent Vitals/Pain Pain Assessment: Faces Faces Pain Scale: Hurts little more Pain Location: abdomen, sternum, minimally R LE Pain Descriptors / Indicators: Grimacing;Guarding Pain Intervention(s): Limited activity within patient's tolerance;Monitored during session;Repositioned    Home Living                      Prior Function            PT Goals (current goals can now be found in the care plan section) Acute Rehab PT Goals Patient Stated Goal: to take a shower Progress towards PT goals: Progressing toward goals    Frequency    Min 3X/week      PT Plan Current plan remains appropriate    Co-evaluation               AM-PAC PT "6 Clicks" Mobility   Outcome Measure  Help needed turning from your back to your side while in a flat bed without using bedrails?: None Help needed moving from lying on your back to sitting on the side of a flat bed without using bedrails?: None Help needed moving to and from a bed to a chair (including a wheelchair)?: None Help needed standing up from a chair using your arms (e.g., wheelchair or bedside chair)?: A Little Help needed to walk in hospital room?: A Little Help needed climbing 3-5 steps with a railing? : A Little 6 Click Score: 21    End of Session   Activity Tolerance: Other (comment);Patient limited by pain (limited by nausea) Patient left: in bed;with call bell/phone within reach Nurse Communication: Mobility status;Other (comment) (pt requests hair wash) PT Visit Diagnosis: Other abnormalities of gait and mobility (R26.89);Muscle weakness (generalized) (M62.81) Pain - Right/Left: Right Pain - part of body: Leg;Ankle and joints of foot     Time: 1537-1600 PT Time Calculation (min) (ACUTE ONLY): 23 min  Charges:  $Gait Training: 8-22 mins $Therapeutic Activity: 8-22 mins                    Corinna Capra, PT, DPT  Acute Rehabilitation Ortho Tech Supervisor [phone] pager [phone]) [phone] office

## 2021-01-16 NOTE — Progress Notes (Signed)
ANTICOAGULATION CONSULT NOTE - Follow Up Consult  Pharmacy Consult for lovenox Indication: h/o right brachial VTE  No Known Allergies  Patient Measurements: Height: 5\' 1"  (154.9 cm) Weight: 72.1 kg (158 lb 15.2 oz) IBW/kg (Calculated) : 47.8   Vital Signs: Temp: 98.5 F (36.9 C) (08/26 1150) Temp Source: Oral (08/26 1150) BP: 139/61 (08/26 1150) Pulse Rate: 85 (08/26 1150)  Labs: Recent Labs    01/14/21 0518 01/15/21 0453 01/16/21 1054  HGB 8.9* 8.2*  --   HCT 27.4* 26.4*  --   PLT 218 252  --   LABPROT  --  13.4  --   INR  --  1.0  --   HEPRLOWMOCWT  --   --  0.53  CREATININE 1.49* 1.59* 1.37*  CKTOTAL  --  42  --     Estimated Creatinine Clearance: 55 mL/min (A) (by C-G formula based on SCr of 1.37 mg/dL (H)).   Medications:  Scheduled:   cholecalciferol  1,000 Units Oral Daily   vitamin B-6  12.5 mg Oral BID   And   doxylamine (Sleep)  12.5 mg Oral BID   DULoxetine  20 mg Oral Daily   enoxaparin (LOVENOX) injection  60 mg Subcutaneous Q12H   famotidine  20 mg Oral BID   feeding supplement (GLUCERNA SHAKE)  237 mL Oral BID BM   hydrALAZINE  25 mg Oral Q8H   mouth rinse  15 mL Mouth Rinse q12n4p   metoCLOPramide (REGLAN) injection  5 mg Intravenous Q6H   pantoprazole (PROTONIX) IV  40 mg Intravenous Q12H   prenatal vitamin w/FE, FA  1 tablet Oral Q1200   sodium chloride flush  3 mL Intravenous Q12H   sucralfate  1 g Oral TID WC & HS   vitamin B-12  1,000 mcg Oral Daily    Assessment: Anti Xa level @ 1054 on 8/26 (drawn 4 hours after the dose was administered @ 0654 on 8/26) was 0.53 units/ml.  GOAL=0.6-1 units/ ml   Goal of Therapy:  Anti-Xa level 0.6-1 units/ml 4hrs after LMWH dose given Monitor platelets by anticoagulation protocol: Yes   Plan:  Will increase lovenox dose by 10%, to 60mg  SQ q12hr. Will recheck a LMWH level (anti Xa) on Monday.  01/16/2021,2:35 PM

## 2021-01-16 NOTE — Progress Notes (Signed)
Inpatient Diabetes Program Recommendations  AACE/ADA: New Consensus Statement on Inpatient Glycemic Control (2015)  Target Ranges:  Prepandial:   less than 140 mg/dL      Peak postprandial:   less than 180 mg/dL (1-2 hours)      Critically ill patients:  140 - 180 mg/dL   Lab Results  Component Value Date   GLUCAP 79 01/16/2021   HGBA1C 5.8 (H) 11/21/2020    Review of Glycemic Control Results for Ashley Camacho, Ashley Camacho (MRN [id number]) as of 01/16/2021 10:04  Ref. Range 01/13/2021 18:44 01/14/2021 04:41 01/14/2021 08:49 01/15/2021 04:45 01/16/2021 04:59  Glucose-Capillary Latest Ref Range: 70 - 99 mg/dL 222 (H) 979 (H) 892 (H) 93 79  Diabetes history: DM 1 Outpatient Diabetes medications: Omnipod with Novolog insulin  Most recent insulin pump rates from [facility]: 0.05 units/hr= 1.2 units in 24 hours, 1 units drops blood sugar 100 mg/dL, 1 units per 30 grams of CHO   In DM coordinator note from 11/20/2020: "Current pump settings are: 12-6am  0.15 units/hour 6am-12am 0.25 units/hour Total 5.4 units/24 hours  CF 80 (1 unit drops glucose 80 mg/dl) I:C 1:19 (1 unit covers 20 grams of carbs) Target BG 100 mg/dl"  Current orders for Inpatient glycemic control:  IV insulin Inpatient Diabetes Program Recommendations:    Patient is eating PO diet. TF's d/c'don 8/24.  Recommend continuation of IV insulin throughout the weekend to ensure that patient is remains able to eat and not vomit.  Will reassess on Monday, August 29 regarding restart of insulin pump and transition off insulin drip. Discussed plan with Dr. Charlotta Newton.  Thanks,  Beryl Meager, RN, BC-ADM Inpatient Diabetes Coordinator Pager [phone]  (8a-5p)

## 2021-01-17 DIAGNOSIS — Z3A29 29 weeks gestation of pregnancy: Secondary | ICD-10-CM

## 2021-01-17 LAB — BASIC METABOLIC PANEL
Anion gap: 8 (ref 5–15)
BUN: 29 mg/dL — ABNORMAL HIGH (ref 6–20)
CO2: 20 mmol/L — ABNORMAL LOW (ref 22–32)
Calcium: 8.4 mg/dL — ABNORMAL LOW (ref 8.9–10.3)
Chloride: 111 mmol/L (ref 98–111)
Creatinine, Ser: 1.25 mg/dL — ABNORMAL HIGH (ref 0.44–1.00)
GFR, Estimated: 60 mL/min — ABNORMAL LOW (ref >60)
Glucose, Bld: 104 mg/dL — ABNORMAL HIGH (ref 70–99)
Potassium: 4.5 mmol/L (ref 3.5–5.1)
Sodium: 139 mmol/L (ref 135–145)

## 2021-01-17 LAB — CBC
HCT: 26.5 % — ABNORMAL LOW (ref 36.0–46.0)
HCT: 24.5 % — ABNORMAL LOW (ref 36.0–46.0)
Hemoglobin: 8.7 g/dL — ABNORMAL LOW (ref 12.0–15.0)
Hemoglobin: 8.3 g/dL — ABNORMAL LOW (ref 12.0–15.0)
MCH: 30.2 pg (ref 26.0–34.0)
MCH: 30.7 pg (ref 26.0–34.0)
MCHC: 32.8 g/dL (ref 30.0–36.0)
MCHC: 33.9 g/dL (ref 30.0–36.0)
MCV: 92 fL (ref 80.0–100.0)
MCV: 90.7 fL (ref 80.0–100.0)
Platelets: 239 10*3/uL (ref 150–400)
Platelets: 248 10*3/uL (ref 150–400)
RBC: 2.88 MIL/uL — ABNORMAL LOW (ref 3.87–5.11)
RBC: 2.7 MIL/uL — ABNORMAL LOW (ref 3.87–5.11)
RDW: 15.5 % (ref 11.5–15.5)
RDW: 15.5 % (ref 11.5–15.5)
WBC: 6.6 10*3/uL (ref 4.0–10.5)
WBC: 8.7 10*3/uL (ref 4.0–10.5)
nRBC: 0 % (ref 0.0–0.2)
nRBC: 0.2 % (ref 0.0–0.2)

## 2021-01-17 LAB — TYPE AND SCREEN
ABO/RH(D): A POS
Antibody Screen: NEGATIVE

## 2021-01-17 LAB — GLUCOSE, CAPILLARY: Glucose-Capillary: 116 mg/dL — ABNORMAL HIGH (ref 70–99)

## 2021-01-17 MED ORDER — PANTOPRAZOLE SODIUM 40 MG IV SOLR: 40.0000 mg | Freq: Two times a day (BID) | INTRAVENOUS | Status: DC

## 2021-01-17 MED ORDER — PANTOPRAZOLE INFUSION (NEW) - SIMPLE MED
8.0000 mg/h | INTRAVENOUS | Status: DC
  Administered 2021-01-17 – 2021-01-20 (×5): 8 mg/h via INTRAVENOUS
  Filled 2021-01-17 (×5): qty 80
  Filled 2021-01-17 (×2): qty 100

## 2021-01-17 MED ORDER — PANTOPRAZOLE SODIUM 40 MG IV SOLR
40.0000 mg | Freq: Two times a day (BID) | INTRAVENOUS | Status: DC
  Administered 2021-01-21 – 2021-01-28 (×15): 40 mg via INTRAVENOUS
  Filled 2021-01-17 (×16): qty 40

## 2021-01-17 MED ORDER — SALINE SPRAY 0.65 % NA SOLN
1.0000 | NASAL | Status: DC
  Administered 2021-01-17 – 2021-02-12 (×48): 1 via NASAL
  Filled 2021-01-17: qty 44

## 2021-01-17 MED ORDER — PANTOPRAZOLE 80MG IVPB - SIMPLE MED
80.0000 mg | Freq: Once | INTRAVENOUS | Status: DC
  Filled 2021-01-17: qty 100

## 2021-01-17 MED ORDER — SENNA 8.6 MG PO TABS
1.0000 | ORAL_TABLET | Freq: Every day | ORAL | Status: DC | PRN
  Administered 2021-02-07 – 2021-02-19 (×3): 8.6 mg via ORAL
  Filled 2021-01-17 (×3): qty 1

## 2021-01-17 MED ORDER — FAMOTIDINE IN NACL 20-0.9 MG/50ML-% IV SOLN
20.0000 mg | Freq: Two times a day (BID) | INTRAVENOUS | Status: DC
  Administered 2021-01-17 – 2021-01-19 (×4): 20 mg via INTRAVENOUS
  Filled 2021-01-17 (×4): qty 50

## 2021-01-17 MED ORDER — PANTOPRAZOLE 80MG IVPB - SIMPLE MED
80.0000 mg | Freq: Once | INTRAVENOUS | Status: AC
  Administered 2021-01-17: 80 mg via INTRAVENOUS
  Filled 2021-01-17: qty 80

## 2021-01-17 MED ORDER — GLYCERIN (LAXATIVE) 2.1 G RE SUPP
1.0000 | Freq: Once | RECTAL | Status: DC | PRN
  Filled 2021-01-17 (×4): qty 1

## 2021-01-17 NOTE — Progress Notes (Addendum)
[facility] Progress Note  Patient: Ashley Camacho    WUJ:[id number]  DOA: 12/16/2020     Date of Service: the patient was seen and examined on 01/17/2021  Brief hospital course: 30 year old female with past medical history of type 1 diabetes, cardiomyopathy, chronic systolic CHF who is 24 weeks 4 days pregnant on presentation.  Presents with complaints of intractable nausea and vomiting. 6/30 admission to [facility] for nausea and vomiting. 7/4-7/20 transferred to [facility] for pneumonia.  Due to rhinovirus. 7/22-7/26 admitted to [facility] again with starvation ketosis, also DVT of brachial vein.  Also started on TPN, PICC line inserted. 7/26 admitted to [facility] 7/27 cardiology consulted for cardiomyopathy, EF visit transfuse, PICC line inserted, tube feeding started. 7/29 having recurrent retention, Foley catheter inserted 7/30 PRBC transfused 8/5 PRBC transfused 8/16 cardiology signed off, no follow-up recommended 8/19 nephrology consulted, no follow-up recommended 8/20 PRBC transfused 8/21 rescue course of betamethasone 8/22 Foley catheter reinserted after removal due to retention. 8/23 GI consulted. MFM consulted, nephrology signed off. 8/24 underwent EGD shows severe esophagitis with mild oozing.   Dobbhoff removed patient on oral diet. 8/25 PICC line removed. 8/26 started on steroids for adrenal insufficiency. 8/27 2 episodes of hematemesis.  Started on PPI drip.  GI reconsulted.  Lovenox held.  Currently plan is monitor for resolution of upper GI bleed.  Subjective: 2 episodes of vomiting.  1 episode in the morning with clots.  Second episode in the late evening with no medications.  Continues to have indigestion and epigastric pain.  Passing gas.  Had a small BM without any blood.  No shortness of breath.  Assessment and Plan: 1.  Severe esophagitis with upper GI bleed SP EGD by [facility] on 8/24. Suspect initial hyperemesis gravidarum caused severe  esophagitis which is now creating further problem with ongoing nausea vomiting and indigestion. Had 2 episodes of vomiting on 8/27 with hematemesis. H&H relatively stable. Patient was on IV Protonix twice daily and oral Pepcid and Carafate. Currently GI reconsulted due to rebleeding.,  DW Dr. Elnoria Howard Holding Lovenox. Currently on Protonix drip. Switching Pepcid to IV.  Continue Carafate.  Currently on clear liquid diet.  2. Intractable nausea and vomiting. Food aversion. Gastroparesis  Hyperemesis gravidarum Adult failure to thrive. Moderate protein calorie malnutrition.  Now resolved. Initially patient was on TPN.  Transfer to [facility] for core track placement from [facility]. Core track placed.  Patient was on tube feeding. EGD performed.  Shows esophagitis. Do not think the patient is suffering from gastroparesis.   GI also agrees. Patient was on scheduled Reglan and Zofran.  Currently on scheduled Reglan and as needed Zofran. Continue PPI and Pepcid. Continues to have bowel movement. Avoid Imodium.   Ultrasound abdomen shows no significant abnormality related to liver or pancreas. Was on Remeron that did not work for improvement in oral intake. Initial plan was to continue core track until delivery at 34 weeks.  Now removed and patient is able to tolerate p.o. diet.  3.  Normocytic anemia. Multifactorial.  Combination of anemia of pregnancy, GI bleed, nutritional deficiency as well as volume overload. Hemoglobin is remaining stable for now. Anemia existing since May 2022. Hematology has ablated patient outpatient and had significant work-up. B12 is relatively low currently being suppressed. Not obtainable colonoscopy in the setting of ongoing pregnancy and relative stability of hemoglobin. Continue supportive measures.  4.  Nutritional deficiency. Currently resident B12, vitamin D supplementation.  Other mineral and vitamin levels were relatively  normal.  5.  Type 1 diabetes  mellitus with hyperand hypoglycemia, on long-term insulin therapy with insulin pump and neuropathy as well as gastroparesis complication On insulin pump at home. Currently on IV insulin with Endo tool. Also on D5 LR for hypoglycemic support. Expecting fluctuation while the patient is currently on clear liquid diet as well as receiving IV steroids.  6.  Acute on chronic systolic CHF Echocardiogram showed EF of 45%. Repeat echocardiogram actually showed improvement and normalization of EF. Currently was consulted. On presentation 2016: Had volume overload.  Treated with IV diuresis. In future when requires IV diuresis, patient will require IV albumin pretreatment to avoid renal dysfunction.  7. Acute kidney injury. Recurrent urinary retention. Right-sided hydronephrosis. Nephrology was consulted.  Considers AKI secondary to hemodynamic issues. Patient had mild proteinuria. Ultrasound shows right-sided hydronephrosis which appears to be physiologic in the setting of pregnancy. Serological requested, HIV, HCV, HBV, C3-C4 and dsDNA as well as ANA are negative. Creatinine was trending upwards due to IV diuresis. Currently on hold. Foley catheter currently continued over the weekend.  8.  Pregnancy. 29 weeks and 5 days. Per MFM C-section planned at 34 weeks. Already received steroids for lung maturity. Management per OB.  9.  Right upper extremity DVT. Diagnosed on 7/17.  Currently on IV Lovenox. Due to upper GI bleed I will be holding Lovenox. Repeat ultrasound. Since the patient appears to have severe esophagitis and remains at risk for GI bleed, need to reconsider risk versus benefit of anticoagulation right upper extremity basilic vein DVT.  10.  Adrenal insufficiency. AM cortisol level 0.6. Patient received IV beclomethasone 2 days ago prior to this test. Coastal stimulation test was positive.  1.3-4.6-6.5. Started on IV Solu-Cortef. Currently we will continue. Outpatient  follow-up with endocrinologist. Will check prolactin, ACTH and thyroglobulin antibodies.  11.  Hypoalbuminemia. Third spacing. Intravascular volume depletion. Monitor for now. IV albumin support for diuresis in case it is needed in future.  12.  UTI. Completed treatment for 7 days of antibiotic.  LDA. PICC line removed 8/25. Foley catheter removed 8/22 and reinserted on 8/22 due to retention. Core track removed 8/24.   Scheduled Meds:  cholecalciferol  1,000 Units Oral Daily   vitamin B-6  12.5 mg Oral BID   And   doxylamine (Sleep)  12.5 mg Oral BID   DULoxetine  20 mg Oral Daily   feeding supplement (GLUCERNA SHAKE)  237 mL Oral BID BM   hydrALAZINE  25 mg Oral Q8H   hydrocortisone sod succinate (SOLU-CORTEF) inj  50 mg Intravenous Q12H   mouth rinse  15 mL Mouth Rinse q12n4p   metoCLOPramide (REGLAN) injection  5 mg Intravenous Q6H   [START ON 01/21/2021] pantoprazole  40 mg Intravenous Q12H   prenatal vitamin w/FE, FA  1 tablet Oral Q1200   sodium chloride  1 spray Each Nare Q2H   sucralfate  1 g Oral TID WC & HS   vitamin B-12  1,000 mcg Oral Daily   Continuous Infusions:  dextrose 5% lactated ringers 50 mL/hr at 01/16/21 0204   famotidine (PEPCID) IV     insulin 1.5 Units/hr (01/17/21 1819)   pantoprazole     PRN Meds: acetaminophen, dextrose, diphenhydrAMINE-zinc acetate, ondansetron, prochlorperazine  Body mass index is 29.84 kg/m.  Nutrition Problem: Inadequate oral intake Etiology: nausea, vomiting     DVT Prophylaxis:   Place TED hose Start: 01/17/21 1655    Advance goals of care discussion: Pt is Full code.  Family Communication:  no family was present at bedside, at the time of interview.   Data Reviewed: I have personally reviewed and interpreted daily labs, tele strips, imaging. Hemoglobin stable 8.3 and 8.7.  Serum creatinine improving to 1.2.  Physical Exam:  General: Appear in moderate distress, no Rash; Oral Mucosa Clear, moist. no  Abnormal Neck Mass Or lumps, Conjunctiva normal  Cardiovascular: S1 and S2 Present, no Murmur, Respiratory: good respiratory effort, Bilateral Air entry present and CTA, no Crackles, no wheezes Abdomen: Bowel Sound present, Soft and no tenderness Extremities: bilateral  Pedal edema Neurology: alert and oriented to time, place, and person affect appropriate. no new focal deficit Gait not checked due to patient safety concerns  Vitals:   01/17/21 1150 01/17/21 1406 01/17/21 1410 01/17/21 1634  BP: 140/64 (!) 136/59 (!) 136/59 (!) 140/57  Pulse: 83  89 85  Resp: 18  17 16   Temp: 98.4 F (36.9 C)  (!) 97.5 F (36.4 C) 98.1 F (36.7 C)  TempSrc: Oral  Oral Oral  SpO2: 97%  97% 99%  Weight:      Height:        Disposition:  Status is: Inpatient  Remains inpatient appropriate because:IV treatments appropriate due to intensity of illness or inability to take PO  Dispo: The patient is from: Home              Anticipated d/c is to: Home              Patient currently is not medically stable to d/c.   Difficult to place patient No  Time spent: 35 minutes. I reviewed all nursing notes, pharmacy notes, vitals, pertinent old records. I have discussed plan of care as described above with RN.  Author: , MD [facility] 01/17/2021 6:28 PM  To reach On-call, see care teams to locate the attending and reach out via [email or web address]. Between 7PM-7AM, please contact night-coverage If you still have difficulty reaching the attending provider, please page the [facility] (Director on Call) for [facility] on amion for assistance.

## 2021-01-17 NOTE — Progress Notes (Signed)
FACULTY PRACTICE ANTEPARTUM COMPREHENSIVE PROGRESS NOTE  Ashley Camacho is a 30 y.o. G2P0101 at [redacted]w[redacted]d who is admitted for N/V, DM1 complications in pregnancy.  Estimated Date of Delivery: 03/30/21 Fetal presentation is cephalic.  Length of Stay:  32 Days. Admitted 12/16/2020  Subjective: Resting comfortable in bed. Denies any changes in how she feels. She did have an emesis episode today with some blood in it. She is otherwise tolerating some PO. She denies any CP/SOB/abdominal pain today. Leg pain remains resolved. She had not had the catheter out to see if spontaneous void.  Patient reports good fetal movement.  She reports no uterine contractions, no bleeding and no loss of fluid per vagina.  Vitals:  Blood pressure (!) 148/65, pulse 85, temperature 98 F (36.7 C), temperature source Oral, resp. rate 18, height 5\' 1"  (1.549 m), weight 71.6 kg, SpO2 97 %. Physical Examination: CONSTITUTIONAL: Well-developed, well-nourished female in no acute distress.  NEUROLOGIC: Alert and oriented to person, place, and time. No cranial nerve deficit noted. PSYCHIATRIC: Normal mood and affect. Normal behavior. Normal judgment and thought content. CARDIOVASCULAR: Normal heart rate noted, regular rhythm RESPIRATORY: Effort and breath sounds normal, no problems with respiration noted MUSCULOSKELETAL: Normal range of motion. no calf tenderness. 2+ distal pulses. 2+ pitting edema present ABDOMEN: Soft, nontender, nondistended, gravid.  Fetal monitoring: FHR: 140 bpm, Variability: moderate, Accelerations: Present, Decelerations: Absent  Uterine activity: q2-6 min, not felt by patient   CBC Latest Ref Rng & Units 01/17/2021 01/15/2021 01/14/2021  WBC 4.0 - 10.5 K/uL 6.6 8.4 8.0  Hemoglobin 12.0 - 15.0 g/dL 01/16/2021) 7.8(L) 8.9(L)  Hematocrit 36.0 - 46.0 % 26.5(L) 26.4(L) 27.4(L)  Platelets 150 - 400 K/uL 239 252 218   CMP Latest Ref Rng & Units 01/17/2021 01/16/2021 01/15/2021  Glucose 70 - 99 mg/dL 01/17/2021) 017(P)  94  BUN 6 - 20 mg/dL 102(H) 85(I) 77(O)  Creatinine 0.44 - 1.00 mg/dL 24(M) 3.53(I) 1.44(R)  Sodium 135 - 145 mmol/L 139 139 139  Potassium 3.5 - 5.1 mmol/L 4.5 4.4 4.5  Chloride 98 - 111 mmol/L 111 110 110  CO2 22 - 32 mmol/L 20(L) 20(L) 21(L)  Calcium 8.9 - 10.3 mg/dL 1.54(M) 0.8(Q) 7.6(P)  Total Protein 6.5 - 8.1 g/dL - - 5.5(L)  Total Bilirubin 0.3 - 1.2 mg/dL - - 1.3(H)  Alkaline Phos 38 - 126 U/L - - 144(H)  AST 15 - 41 U/L - - 25  ALT 0 - 44 U/L - - 20      Current scheduled medications  cholecalciferol  1,000 Units Oral Daily   vitamin B-6  12.5 mg Oral BID   And   doxylamine (Sleep)  12.5 mg Oral BID   DULoxetine  20 mg Oral Daily   enoxaparin (LOVENOX) injection  60 mg Subcutaneous Q12H   famotidine  20 mg Oral BID   feeding supplement (GLUCERNA SHAKE)  237 mL Oral BID BM   hydrALAZINE  25 mg Oral Q8H   hydrocortisone sod succinate (SOLU-CORTEF) inj  50 mg Intravenous Q12H   mouth rinse  15 mL Mouth Rinse q12n4p   metoCLOPramide (REGLAN) injection  5 mg Intravenous Q6H   pantoprazole (PROTONIX) IV  40 mg Intravenous Q12H   prenatal vitamin w/FE, FA  1 tablet Oral Q1200   sodium chloride  1 spray Each Nare Q2H   sucralfate  1 g Oral TID WC & HS   vitamin B-12  1,000 mcg Oral Daily    I have reviewed the patient's current medications.  ASSESSMENT: Active Problems:   Delivery with history of C-section   Acute kidney injury (HCC)   Hyperemesis   DKA, type 1, not at goal [facility])   Acute deep vein thrombosis (DVT) of brachial vein of right upper extremity (HCC)   Acute on chronic combined systolic and diastolic CHF (congestive heart failure) (HCC)   History of preterm delivery, currently pregnant    PLAN: 1) OB care -Fetal well being             NST reactive, Cat I tracing, continue Qdaily NST with FHT qshift             BPP weekly, last completed 8/24             Growth q 4 wks, last completed 8/22             S/p BMZ 8/23-24 -MFM following- see their  prior note for details   2) GI Nausea/vomiting/esophagitis             Tolerating carb modified diet, Glucerna             Plan to slowly transition IV anti-emetics to oral             Continue pepcid, protonix, carafate  S/p EGD with GI on 8/24 Diarrhea- improved, pt reports no BM overnight. C. Diff neg Malnutrition             Dietician on board             B12 replacement per IM             On daily vitamin   3) ENDO - Type 1 DM             On endo tool             Reviewed w/DM coordinator, if pt continue to tolerate reg diet, will transition to insulin pump Mon   - Adrenal insufficiency  Diagnosed on 8/26 and started on steriods   4) Neph/GU -UOP remains adequate -hesitant to remove foley due to prior requirement of re-insertion, will continue foley at this time -s/p Amox for UTI, will repeat culture next week   5) Heme -Anemia             Work up per IM Fecal occult positive- GI aware, in the s/o pregnancy, will hold off on colonoscopy at this time.  If Hgb worsens and assessment/intervention is warranted may reconsider  HgB stable this morning             -h/o right brachial VTE             Lovenox 55u bid             level followed by pharmacy   6) Cardio -HTN- currently on Hydralazine 25mg  q8hr - BP returned to baseline with administration of steroids   -s/p cardiology consult CHF stable   7) Maternal care -tylenol prn, leg pain resolved, oxycodone discontinued -encourage ambulation, PT/OT on board     DISP: Plan as outlined above, continue with supportive care with co-management per IM.  Goal for delivery @ 34wk via Repeat C-section and bilateral tubal sterilization if maternal/fetal well being remains stable. Medicaid tubal papers signed and on her paper chart.    Continue routine antenatal care.   , MD, FACOG Obstetrician & Gynecologist, [facility] for [facility], [facility]

## 2021-01-17 NOTE — Progress Notes (Signed)
Upon assessment, Pt had bloody emesis with clots along with epistaxis. Blood clots were small. Epistaxis controlled with pressure.MD made aware

## 2021-01-17 NOTE — Progress Notes (Signed)
Pt. Had a subsequent episode of hematemesis with blood clots. No epistaxis noted. Emesis amount 100 cc. MD notified and plan of care updated by physician. See new orders.

## 2021-01-18 ENCOUNTER — Inpatient Hospital Stay (HOSPITAL_COMMUNITY): Payer: Medicaid Other

## 2021-01-18 DIAGNOSIS — I82401 Acute embolism and thrombosis of unspecified deep veins of right lower extremity: Secondary | ICD-10-CM

## 2021-01-18 LAB — CBC
HCT: 25.8 % — ABNORMAL LOW (ref 36.0–46.0)
Hemoglobin: 8.1 g/dL — ABNORMAL LOW (ref 12.0–15.0)
MCH: 30 pg (ref 26.0–34.0)
MCHC: 31.4 g/dL (ref 30.0–36.0)
MCV: 95.6 fL (ref 80.0–100.0)
Platelets: 219 10*3/uL (ref 150–400)
RBC: 2.7 MIL/uL — ABNORMAL LOW (ref 3.87–5.11)
RDW: 15.6 % — ABNORMAL HIGH (ref 11.5–15.5)
WBC: 8.7 10*3/uL (ref 4.0–10.5)
nRBC: 0 % (ref 0.0–0.2)

## 2021-01-18 LAB — GLUCOSE, CAPILLARY: Glucose-Capillary: 87 mg/dL (ref 70–99)

## 2021-01-18 MED ORDER — ALBUMIN HUMAN 25 % IV SOLN
12.5000 g | Freq: Once | INTRAVENOUS | Status: AC
  Administered 2021-01-18: 12.5 g via INTRAVENOUS
  Filled 2021-01-18: qty 50

## 2021-01-18 MED ORDER — FUROSEMIDE 10 MG/ML IJ SOLN
20.0000 mg | Freq: Once | INTRAMUSCULAR | Status: AC
  Administered 2021-01-18: 20 mg via INTRAVENOUS
  Filled 2021-01-18: qty 2

## 2021-01-18 NOTE — Progress Notes (Signed)
[facility] Progress Note  Patient: Ashley Camacho    TKW:[id number]  DOA: 12/16/2020     Date of Service: the patient was seen and examined on 01/18/2021  Brief hospital course: 30 year old female with past medical history of type 1 diabetes, cardiomyopathy, chronic systolic CHF who is 24 weeks 4 days pregnant on presentation.  Presents with complaints of intractable nausea and vomiting. 6/30 admission to [facility] for nausea and vomiting. 7/4-7/20 transferred to [facility] for pneumonia.  Due to rhinovirus. 7/22-7/26 admitted to [facility] again with starvation ketosis, also DVT of brachial vein.  Also started on TPN, PICC line inserted. 7/26 admitted to [facility] 7/27 cardiology consulted for cardiomyopathy, EF visit transfuse, PICC line inserted, tube feeding started. 7/29 having recurrent retention, Foley catheter inserted 7/30 PRBC transfused 8/5 PRBC transfused 8/16 cardiology signed off, no follow-up recommended 8/19 nephrology consulted, no follow-up recommended 8/20 PRBC transfused 8/21 rescue course of betamethasone 8/22 Foley catheter reinserted after removal due to retention. 8/23 GI consulted. MFM consulted, nephrology signed off. 8/24 underwent EGD shows severe esophagitis with mild oozing.   Dobbhoff removed patient on oral diet. 8/25 PICC line removed. 8/26 started on steroids for adrenal insufficiency. 8/27 2 episodes of hematemesis.  Started on PPI drip.  GI reconsulted.  Lovenox held. 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  Currently plan is monitor for resolution of upper GI bleed.  Subjective: No further vomiting.  No bleeding.  No nausea.  No abdominal pain.  No chest pain or shortness of breath.  Per RN had reduced urine output. Per RN patient is having hard time with blood draw and requiring fingerstick for labs.  Assessment and Plan: 1.  Severe esophagitis with upper GI bleed SP EGD by  [facility] on 8/24. Suspect initial hyperemesis gravidarum caused severe esophagitis which is now creating further problem with ongoing nausea vomiting and indigestion. Had 2 episodes of vomiting on 8/27 with hematemesis. H&H relatively stable. Patient was on IV Protonix twice daily and oral Pepcid and Carafate. Currently GI reconsulted due to rebleeding.,  DW Dr. Elnoria Howard Holding Lovenox. Currently on Protonix drip. Switching Pepcid to IV.  Continue Carafate.  Currently on clear liquid diet.  2. Intractable nausea and vomiting. Food aversion. Gastroparesis  Hyperemesis gravidarum Adult failure to thrive. Moderate protein calorie malnutrition.  Now resolved. Initially patient was on TPN.  Transfer to [facility] for core track placement from [facility]. Core track placed.  Patient was on tube feeding. EGD performed.  Shows esophagitis. Do not think the patient is suffering from gastroparesis.   GI also agrees. Patient was on scheduled Reglan and Zofran.  Currently on scheduled Reglan and as needed Zofran. Continue PPI and Pepcid. Continues to have bowel movement. Avoid Imodium.   Ultrasound abdomen shows no significant abnormality related to liver or pancreas. Was on Remeron that did not work for improvement in oral intake. Initial plan was to continue core track until delivery at 34 weeks.  Now removed and patient is able to tolerate p.o. diet.  3.  Normocytic anemia. Multifactorial.  Combination of anemia of pregnancy, GI bleed, nutritional deficiency as well as volume overload. Hemoglobin is remaining stable for now. Anemia existing since May 2022. Hematology has ablated patient outpatient and had significant work-up. B12 is relatively low currently being suppressed. Not obtainable colonoscopy in the setting of ongoing pregnancy and relative stability of hemoglobin. Continue supportive measures.  4.  Nutritional deficiency. Currently resident B12,  vitamin D supplementation.  Other  mineral and vitamin levels were relatively normal.  5.  Type 1 diabetes mellitus with hyperand hypoglycemia, on long-term insulin therapy with insulin pump and neuropathy as well as gastroparesis complication On insulin pump at home. Currently on IV insulin with Endo tool. Also on D5 LR for hypoglycemic support. Expecting fluctuation while the patient is currently on clear liquid diet as well as receiving IV steroids.  6.  Acute on chronic systolic CHF Echocardiogram showed EF of 45%. Repeat echocardiogram actually showed improvement and normalization of EF. Currently was consulted. On presentation 2016: Had volume overload.  Treated with IV diuresis. In future when requires IV diuresis, patient will require IV albumin pretreatment to avoid renal dysfunction.  7. Acute kidney injury. Recurrent urinary retention. Right-sided hydronephrosis. Nephrology was consulted.  Considers AKI secondary to hemodynamic issues. Patient had mild proteinuria. Ultrasound shows right-sided hydronephrosis which appears to be physiologic in the setting of pregnancy. Serological requested, HIV, HCV, HBV, C3-C4 and dsDNA as well as ANA are negative. Creatinine was trending upwards due to IV diuresis. Currently on hold. Foley catheter currently continued over the weekend.  8.  Pregnancy. 29 weeks and 5 days. Per MFM C-section planned at 34 weeks. Already received steroids for lung maturity. Management per OB.  9.  Right upper extremity DVT. Diagnosed on 7/17.  Currently on IV Lovenox. Due to upper GI bleed I will be holding Lovenox. Repeat ultrasound. Since the patient appears to have severe esophagitis and remains at risk for GI bleed, need to reconsider risk versus benefit of anticoagulation right upper extremity basilic vein DVT.  10.  Adrenal insufficiency. AM cortisol level 0.6. Patient received IV beclomethasone 2 days ago prior to this test. Coastal stimulation test was positive.   1.3-4.6-6.5. Started on IV Solu-Cortef. Currently we will continue. Outpatient follow-up with endocrinologist. Will check prolactin, ACTH and thyroglobulin antibodies.  11.  Hypoalbuminemia. Third spacing. Intravascular volume depletion. Monitor for now. IV albumin support for diuresis in case it is needed in future.  12.  UTI. Completed treatment for 7 days of antibiotic.  13.  Poor veins. Patient is currently having poor access with issues with blood draw. Currently we will minimize blood draw since her hemoglobin is stable as well as serum creatinine is stable.   LDA. PICC line removed 8/25. Foley catheter removed 8/22 and reinserted on 8/22 due to retention. Core track removed 8/24.   Scheduled Meds:  cholecalciferol  1,000 Units Oral Daily   vitamin B-6  12.5 mg Oral BID   And   doxylamine (Sleep)  12.5 mg Oral BID   DULoxetine  20 mg Oral Daily   feeding supplement (GLUCERNA SHAKE)  237 mL Oral BID BM   hydrALAZINE  25 mg Oral Q8H   hydrocortisone sod succinate (SOLU-CORTEF) inj  50 mg Intravenous Q12H   mouth rinse  15 mL Mouth Rinse q12n4p   metoCLOPramide (REGLAN) injection  5 mg Intravenous Q6H   [START ON 01/21/2021] pantoprazole  40 mg Intravenous Q12H   prenatal vitamin w/FE, FA  1 tablet Oral Q1200   sodium chloride  1 spray Each Nare Q2H   sucralfate  1 g Oral TID WC & HS   vitamin B-12  1,000 mcg Oral Daily   Continuous Infusions:  dextrose 5% lactated ringers 50 mL/hr at 01/17/21 2339   famotidine (PEPCID) IV 20 mg (01/18/21 1037)   insulin 5.5 Units/hr (01/18/21 1848)   pantoprazole 8 mg/hr (01/18/21 1716)   PRN Meds: acetaminophen,  dextrose, diphenhydrAMINE-zinc acetate, Glycerin (Adult), ondansetron, prochlorperazine, senna  Body mass index is 29.84 kg/m.  Nutrition Problem: Inadequate oral intake Etiology: nausea, vomiting     DVT Prophylaxis:   Place TED hose Start: 01/17/21 1655    Advance goals of care discussion: Pt is Full  code.  Family Communication: no family was present at bedside, at the time of interview.   Data Reviewed: I have personally reviewed and interpreted daily labs, tele strips, imaging. Hemoglobin stable.  Physical Exam:  General: Appear in mild distress, no Rash; Oral Mucosa Clear, moist. no Abnormal Neck Mass Or lumps, Conjunctiva normal  Cardiovascular: S1 and S2 Present, no Murmur, Respiratory: good respiratory effort, Bilateral Air entry present and CTA, no Crackles, no wheezes Abdomen: Bowel Sound present, Soft and no tenderness Extremities: bilateral  Pedal edema Neurology: alert and oriented to time, place, and person affect appropriate. no new focal deficit Gait not checked due to patient safety concerns   Vitals:   01/18/21 1151 01/18/21 1339 01/18/21 1342 01/18/21 1542  BP: (!) 159/61 136/61 136/61 (!) 136/58  Pulse: 84  82 84  Resp:    18  Temp:    98.2 F (36.8 C)  TempSrc:    Oral  SpO2:    98%  Weight:      Height:        Disposition:  Status is: Inpatient  Remains inpatient appropriate because:IV treatments appropriate due to intensity of illness or inability to take PO  Dispo: The patient is from: Home              Anticipated d/c is to: Home              Patient currently is not medically stable to d/c.   Difficult to place patient No  Time spent: 35 minutes. I reviewed all nursing notes, pharmacy notes, vitals, pertinent old records. I have discussed plan of care as described above with RN.  Author: Lynden Oxford, MD [facility] 01/18/2021 7:02 PM  To reach On-call, see care teams to locate the attending and reach out via [email or web address]. Between 7PM-7AM, please contact night-coverage If you still have difficulty reaching the attending provider, please page the [facility] (Director on Call) for [facility] on amion for assistance.

## 2021-01-18 NOTE — Progress Notes (Signed)
FACULTY PRACTICE ANTEPARTUM COMPREHENSIVE PROGRESS NOTE  Ashley Camacho is a 30 y.o. G2P0101 at [redacted]w[redacted]d who is admitted for N/V, DM1 complications in pregnancy.  Estimated Date of Delivery: 03/30/21 Fetal presentation is cephalic.  Length of Stay:  33 Days. Admitted 12/16/2020  Subjective: Today she is feeling her baseline. She denies any additional N/V after 2nd episode of hematemesis. She denies CP/SOB/abdominal pain.   Patient reports good fetal movement.  She reports no uterine contractions, no bleeding and no loss of fluid per vagina.  Vitals:  Blood pressure (!) 126/52, pulse 90, temperature 98.8 F (37.1 C), temperature source Oral, resp. rate 18, height 5\' 1"  (1.549 m), weight 71.6 kg, SpO2 97 %. Physical Examination: CONSTITUTIONAL: Well-developed, well-nourished female in no acute distress.  NEUROLOGIC: Alert and oriented to person, place, and time. No cranial nerve deficit noted. PSYCHIATRIC: Normal mood and affect. Normal behavior. Normal judgment and thought content. CARDIOVASCULAR: Normal heart rate noted, regular rhythm RESPIRATORY: Effort and breath sounds normal, no problems with respiration noted MUSCULOSKELETAL: Normal range of motion. No edema and no tenderness. 2+ distal pulses. ABDOMEN: Soft, nontender, nondistended, gravid. CERVIX: Dilation: 2 Effacement (%): 60 Exam by:: Dr. [email or web address]  Fetal monitoring: FHR: 140 bpm, Variability: moderate, Accelerations: Present, Decelerations: Absent  Uterine activity: irregular contractions   Results for orders placed or performed during the hospital encounter of 12/16/20 (from the past 48 hour(s))  Low molecular wgt heparin (fractionated)     Status: None   Collection Time: 01/16/21 10:54 AM  Result Value Ref Range   Heparin LMW 0.53 IU/mL    Comment:        THERAPEUTIC RANGE: DVT,PE,ACS on LMWH 1 mg/kg q12 at 4 hrs = 0.5-1.2 units/mL. DVT,PE on LMWH 1.5 mg/kg q24 at 4 hrs = 1-2 units/mL. Performed at [facility]  Lab, [address] [place], [place]   Basic metabolic panel     Status: Abnormal   Collection Time: 01/16/21 10:54 AM  Result Value Ref Range   Sodium 139 135 - 145 mmol/L   Potassium 4.4 3.5 - 5.1 mmol/L   Chloride 110 98 - 111 mmol/L   CO2 20 (L) 22 - 32 mmol/L   Glucose, Bld 127 (H) 70 - 99 mg/dL    Comment: Glucose reference range applies only to samples taken after fasting for at least 8 hours.   BUN 33 (H) 6 - 20 mg/dL   Creatinine, Ser 01/18/21 (H) 0.44 - 1.00 mg/dL   Calcium 8.4 (L) 8.9 - 10.3 mg/dL   GFR, Estimated 54 (L) >60 mL/min    Comment: (NOTE) Calculated using the CKD-EPI Creatinine Equation (2021)    Anion gap 9 5 - 15    Comment: Performed at [facility] Lab, [address] [place], [place]  Magnesium     Status: Abnormal   Collection Time: 01/16/21  3:11 PM  Result Value Ref Range   Magnesium 2.5 (H) 1.7 - 2.4 mg/dL    Comment: Performed at [facility] Lab, [address], [place], [place]  Basic metabolic panel     Status: Abnormal   Collection Time: 01/17/21  4:39 AM  Result Value Ref Range   Sodium 139 135 - 145 mmol/L   Potassium 4.5 3.5 - 5.1 mmol/L   Chloride 111 98 - 111 mmol/L   CO2 20 (L) 22 - 32 mmol/L   Glucose, Bld 104 (H) 70 - 99 mg/dL    Comment: Glucose reference range applies  only to samples taken after fasting for at least 8 hours.   BUN 29 (H) 6 - 20 mg/dL   Creatinine, Ser 6.29 (H) 0.44 - 1.00 mg/dL   Calcium 8.4 (L) 8.9 - 10.3 mg/dL   GFR, Estimated 60 (L) >60 mL/min    Comment: (NOTE) Calculated using the CKD-EPI Creatinine Equation (2021)    Anion gap 8 5 - 15    Comment: Performed at [facility] Lab, [address], [place], [place] [address]  CBC     Status: Abnormal   Collection Time: 01/17/21  4:39 AM  Result Value Ref Range   WBC 6.6 4.0 - 10.5 K/uL   RBC 2.88 (L) 3.87 - 5.11 MIL/uL   Hemoglobin 8.7 (L) 12.0 - 15.0 g/dL   HCT 65.0 (L) 35.4 - 65.6 %   MCV 92.0 80.0 - 100.0 fL   MCH 30.2  26.0 - 34.0 pg   MCHC 32.8 30.0 - 36.0 g/dL   RDW 81.2 75.1 - 70.0 %   Platelets 239 150 - 400 K/uL   nRBC 0.0 0.0 - 0.2 %    Comment: Performed at [facility] Lab, [address] [place], [place] [address]  Type and screen [facility]     Status: None   Collection Time: 01/17/21  4:39 AM  Result Value Ref Range   ABO/RH(D) A POS    Antibody Screen NEG    Sample Expiration      01/20/2021,2359 Performed at [facility] Lab, [address] [place], [place] [address]   Glucose, capillary     Status: Abnormal   Collection Time: 01/17/21  6:08 AM  Result Value Ref Range   Glucose-Capillary 116 (H) 70 - 99 mg/dL    Comment: Glucose reference range applies only to samples taken after fasting for at least 8 hours.  CBC     Status: Abnormal   Collection Time: 01/17/21  5:43 PM  Result Value Ref Range   WBC 8.7 4.0 - 10.5 K/uL   RBC 2.70 (L) 3.87 - 5.11 MIL/uL   Hemoglobin 8.3 (L) 12.0 - 15.0 g/dL   HCT 91.6 (L) 38.4 - 66.5 %   MCV 90.7 80.0 - 100.0 fL   MCH 30.7 26.0 - 34.0 pg   MCHC 33.9 30.0 - 36.0 g/dL   RDW 99.3 57.0 - 17.7 %   Platelets 248 150 - 400 K/uL   nRBC 0.2 0.0 - 0.2 %    Comment: Performed at [facility] Lab, [address], [place], [place] [address]  CBC     Status: Abnormal   Collection Time: 01/18/21  3:49 AM  Result Value Ref Range   WBC 8.7 4.0 - 10.5 K/uL   RBC 2.70 (L) 3.87 - 5.11 MIL/uL   Hemoglobin 8.1 (L) 12.0 - 15.0 g/dL   HCT 00.9 (L) 23.3 - 00.7 %   MCV 95.6 80.0 - 100.0 fL   MCH 30.0 26.0 - 34.0 pg   MCHC 31.4 30.0 - 36.0 g/dL   RDW 62.2 (H) 63.3 - 35.4 %   Platelets 219 150 - 400 K/uL   nRBC 0.0 0.0 - 0.2 %    Comment: Performed at [facility] Lab, [address], [place], [place] [address]  Glucose, capillary     Status: None   Collection Time: 01/18/21  5:39 AM  Result Value Ref Range   Glucose-Capillary 87 70 - 99 mg/dL    Comment: Glucose reference range  applies only to samples taken after fasting for at  least 8 hours.    No results found.  Current scheduled medications  cholecalciferol  1,000 Units Oral Daily   vitamin B-6  12.5 mg Oral BID   And   doxylamine (Sleep)  12.5 mg Oral BID   DULoxetine  20 mg Oral Daily   feeding supplement (GLUCERNA SHAKE)  237 mL Oral BID BM   hydrALAZINE  25 mg Oral Q8H   hydrocortisone sod succinate (SOLU-CORTEF) inj  50 mg Intravenous Q12H   mouth rinse  15 mL Mouth Rinse q12n4p   metoCLOPramide (REGLAN) injection  5 mg Intravenous Q6H   [START ON 01/21/2021] pantoprazole  40 mg Intravenous Q12H   prenatal vitamin w/FE, FA  1 tablet Oral Q1200   sodium chloride  1 spray Each Nare Q2H   sucralfate  1 g Oral TID WC & HS   vitamin B-12  1,000 mcg Oral Daily    I have reviewed the patient's current medications.  ASSESSMENT: Delivery with history of C-section   Acute kidney injury (HCC)   Hyperemesis   DKA, type 1, not at goal [facility])   Acute deep vein thrombosis (DVT) of brachial vein of right upper extremity (HCC)   Acute on chronic combined systolic and diastolic CHF (congestive heart failure) (HCC)   History of preterm delivery, currently pregnant  PLAN: 1) OB care -Fetal well being             NST reactive, Cat I tracing, continue Qdaily NST with FHT qshift             BPP weekly, last completed 8/24             Growth q 4 wks, last completed 8/22             S/p BMZ 8/23-24 -MFM following- see their prior note for details   2) GI Nausea/vomiting/esophagitis             Tolerating carb modified diet, Glucerna             Plan to slowly transition IV anti-emetics to oral             Continue pepcid, protonix, carafate             S/p EGD with GI on 8/24  PPI drip and clear liquids for diet d/t hematemesis Diarrhea- improved, C. Diff neg Malnutrition             Dietician on board             B12 replacement per IM             On daily vitamin   3) ENDO - Type 1 DM             On endo tool             If pt continue to tolerate  reg diet, will transition to insulin pump Mon.    - Adrenal insufficiency             Diagnosed on 8/26 and started on steriods   4) Neph/GU -UOP remains adequate -hesitant to remove foley due to prior requirement of re-insertion, will continue foley at this time -s/p Amox for UTI, will repeat culture next week   5) Heme -Anemia             Work up per IM Fecal occult positive- GI aware, in the s/o pregnancy, will  hold off on colonoscopy at this time.  If Hgb worsens and assessment/intervention is warranted may reconsider  HgB stable this morning             -h/o right brachial VTE             Lovenox 55u bid - currently on hold due to recurrent hematemesis. IM team reviewed with heme             level followed by pharmacy   6) Cardio -HTN- currently on Hydralazine 25mg  q8hr - BP returned to baseline with administration of steroids   -s/p cardiology consult CHF stable   7) Maternal care -tylenol prn -encourage ambulation, PT/OT on board     DISP: Plan as outlined above, continue with supportive care with co-management per IM.  Goal for delivery @ 34wk via Repeat C-section and bilateral tubal sterilization if maternal/fetal well being remains stable. Medicaid tubal papers signed and on her paper chart.    Continue routine antenatal care.   , MD, FACOG Obstetrician & Gynecologist, [facility], [facility]

## 2021-01-18 NOTE — Progress Notes (Signed)
PROGRESS NOTE FOR  GI  Subjective: No further hematemesis overnight.  Objective: Vital signs in last 24 hours: Temp:  [97.5 F (36.4 C)-98.8 F (37.1 C)] 98.4 F (36.9 C) (08/28 0800) Pulse Rate:  [82-90] 87 (08/28 0800) Resp:  [16-18] 18 (08/28 0800) BP: (126-151)/(52-64) 142/58 (08/28 0800) SpO2:  [93 %-99 %] 93 % (08/28 0800) Last BM Date: 01/17/21  Intake/Output from previous day: 08/27 0701 - 08/28 0700 In: 2024.6 [P.O.:1240; I.V.:684.6; IV Piggyback:100] Out: 1255 [Urine:1155; Emesis/NG output:100] Intake/Output this shift: No intake/output data recorded.  General appearance: alert, no distress, and tired appearing Resp: clear to auscultation bilaterally Cardio: regular rate and rhythm GI: soft, non-tender; bowel sounds normal; no masses,  no organomegaly and gravid abdomen Extremities: extremities normal, atraumatic, no cyanosis or edema  Lab Results: Recent Labs    01/17/21 0439 01/17/21 1743 01/18/21 0349  WBC 6.6 8.7 8.7  HGB 8.7* 8.3* 8.1*  HCT 26.5* 24.5* 25.8*  PLT 239 248 219   BMET Recent Labs    01/16/21 1054 01/17/21 0439  NA 139 139  K 4.4 4.5  CL 110 111  CO2 20* 20*  GLUCOSE 127* 104*  BUN 33* 29*  CREATININE 1.37* 1.25*  CALCIUM 8.4* 8.4*   LFT No results for input(s): PROT, ALBUMIN, AST, ALT, ALKPHOS, BILITOT, BILIDIR, IBILI in the last 72 hours. PT/INR No results for input(s): LABPROT, INR in the last 72 hours. Hepatitis Panel No results for input(s): HEPBSAG, HCVAB, HEPAIGM, HEPBIGM in the last 72 hours. C-Diff No results for input(s): CDIFFTOX in the last 72 hours. Fecal Lactopherrin No results for input(s): FECLLACTOFRN in the last 72 hours.  Studies/Results: No results found.  Medications: Scheduled:  cholecalciferol  1,000 Units Oral Daily   vitamin B-6  12.5 mg Oral BID   And   doxylamine (Sleep)  12.5 mg Oral BID   DULoxetine  20 mg Oral Daily   feeding supplement (GLUCERNA SHAKE)  237 mL Oral BID BM    hydrALAZINE  25 mg Oral Q8H   hydrocortisone sod succinate (SOLU-CORTEF) inj  50 mg Intravenous Q12H   mouth rinse  15 mL Mouth Rinse q12n4p   metoCLOPramide (REGLAN) injection  5 mg Intravenous Q6H   [START ON 01/21/2021] pantoprazole  40 mg Intravenous Q12H   prenatal vitamin w/FE, FA  1 tablet Oral Q1200   sodium chloride  1 spray Each Nare Q2H   sucralfate  1 g Oral TID WC & HS   vitamin B-12  1,000 mcg Oral Daily   Continuous:  dextrose 5% lactated ringers 50 mL/hr at 01/17/21 2339   famotidine (PEPCID) IV 20 mg (01/17/21 2144)   insulin 2 Units/hr (01/18/21 0734)   pantoprazole 8 mg/hr (01/18/21 0555)    Assessment/Plan: 1) LA Grade C esophagitis. 2) Hematemesis. 3) Anemia - mild down trend. 4) DVT.   The patient was reported to have hematemesis with large clots.  She remained hemodynamically stable.  On 01/14/2021 the patient's EGD was positive for an LA Grade C esophagitis.  She requires anticoagulation for her DVT.  Most likely she is bleeding from her esophagitis, which is precipitated/worsened by the anticoagulation.  Plan: 1) Agree with changing pantoprazole to a continuous drip. 2) Continue with sucralfate and famotidine. 3) Monitor HGB and transfuse if necessary. 4) Minimize anticoagulation of possible.   LOS: 33 days   Lunden Mcleish D 01/18/2021, 8:43 AM

## 2021-01-18 NOTE — Progress Notes (Signed)
VASCULAR LAB    Right upper extremity venous duplex has been performed.  See CV proc for preliminary results.   Messaged results to Dr. Allena Katz via secure text.  Deidra Spease, RVT 01/18/2021, 3:14 PM

## 2021-01-19 DIAGNOSIS — K209 Esophagitis, unspecified without bleeding: Secondary | ICD-10-CM

## 2021-01-19 LAB — GLUCOSE, CAPILLARY: Glucose-Capillary: 103 mg/dL — ABNORMAL HIGH (ref 70–99)

## 2021-01-19 MED ORDER — FAMOTIDINE 20 MG PO TABS
20.0000 mg | ORAL_TABLET | Freq: Once | ORAL | Status: AC
  Administered 2021-01-19: 20 mg via ORAL
  Filled 2021-01-19: qty 1

## 2021-01-19 MED ORDER — FAMOTIDINE IN NACL 20-0.9 MG/50ML-% IV SOLN
20.0000 mg | Freq: Two times a day (BID) | INTRAVENOUS | Status: DC
  Administered 2021-01-21 – 2021-01-22 (×5): 20 mg via INTRAVENOUS
  Filled 2021-01-19 (×6): qty 50

## 2021-01-19 NOTE — Progress Notes (Signed)
[facility] Progress Note  Patient: Ashley Camacho    XLK:[id number]  DOA: 12/16/2020     Date of Service: the patient was seen and examined on 01/19/2021  Brief hospital course: 30 year old female with past medical history of type 1 diabetes, cardiomyopathy, chronic systolic CHF who is 24 weeks 4 days pregnant on presentation.  Presents with complaints of intractable nausea and vomiting. 6/30 admission to [facility] for nausea and vomiting. 7/4-7/20 transferred to [facility] for pneumonia.  Due to rhinovirus. 7/22-7/26 admitted to [facility] again with starvation ketosis, also DVT of brachial vein.  Also started on TPN, PICC line inserted. 7/26 admitted to [facility] for Cortrak placement 7/27 cardiology consulted for cardiomyopathy, PICC line inserted, tube feeding started. 7/29 having recurrent retention, Foley catheter inserted 7/30 PRBC transfused 8/5 PRBC transfused 8/16 cardiology signed off, no follow-up recommended 8/19 nephrology consulted, no follow-up recommended 8/20 PRBC transfused 8/21 rescue course of betamethasone 8/22 Foley catheter reinserted after removal due to retention. 8/23 GI consulted. MFM consulted, nephrology signed off. 8/24 underwent EGD shows severe esophagitis with mild oozing.   Dobbhoff removed patient on oral diet. 8/25 PICC line removed. 8/26 started on steroids for adrenal insufficiency. 8/27 2 episodes of hematemesis.  Started on PPI drip.  GI reconsulted.  Lovenox held. 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 Currently plan is monitor for stabilization of CBC, follow-up with GI recommendation.  Subjective: No further vomiting.  No abdominal pain.  Passing gas.  BM without any blood.  No fever no chills.  No shortness of breath.  No chest pain.  No dizziness or lightheadedness.  Still has indigestion.  Assessment and Plan: 1.  Severe esophagitis with upper GI bleed SP EGD by  [facility] on 8/24. Suspect initial hyperemesis gravidarum caused severe esophagitis which is now creating further problem with ongoing nausea vomiting and indigestion. Had 2 episodes of vomiting on 8/27 with hematemesis. H&H relatively stable. Patient was on IV Protonix twice daily and oral Pepcid and Carafate. Currently GI reconsulted due to rebleeding.,  DW Dr. Elnoria Howard Holding Lovenox. Currently on Protonix drip. Switching Pepcid to IV.  Continue Carafate.  Advancing to soft diet. Will discuss with GI tomorrow regarding resumption of anticoagulation.   2. Intractable nausea and vomiting. Food aversion. Gastroparesis  Hyperemesis gravidarum Adult failure to thrive. Moderate protein calorie malnutrition.  Now resolved. Initially patient was on TPN.  Transfer to [facility] for core track placement from [facility] Core track placed.  Patient was on tube feeding. EGD performed.  Shows esophagitis. Do not think the patient is suffering from gastroparesis.   GI also agrees. Patient was on scheduled Reglan and Zofran.  Currently on scheduled Reglan and as needed Zofran. Continue PPI and Pepcid. Continues to have bowel movement. Avoid Imodium.   Ultrasound abdomen shows no significant abnormality related to liver or pancreas. Was on Remeron that did not work for improvement in oral intake. Initial plan was to continue core track until delivery at 34 weeks.  Now removed and patient is able to tolerate p.o. diet.   3.  Normocytic anemia. Multifactorial.  Combination of anemia of pregnancy, GI bleed, nutritional deficiency as well as volume overload. Hemoglobin is remaining stable for now. Anemia existing since May 2022. Hematology has ablated patient outpatient and had significant work-up. B12 is relatively low currently being suppressed. Not obtainable colonoscopy in the setting of ongoing pregnancy and relative stability of hemoglobin. Continue supportive measures.  4.  Nutritional  deficiency. Currently resident B12, vitamin D supplementation.  Other mineral and vitamin levels were relatively normal.   5.  Type 1 diabetes mellitus with hyperand hypoglycemia, on long-term insulin therapy with insulin pump and neuropathy as well as gastroparesis complication On insulin pump at home. Currently on IV insulin with Endo tool. Also on D5 LR for hypoglycemic support. Expecting fluctuation while the patient is currently on clear liquid diet as well as receiving IV steroids.   6.  Acute on chronic systolic CHF Echocardiogram showed EF of 45%. Repeat echocardiogram actually showed improvement and normalization of EF. Currently was consulted. On presentation 2016: Had volume overload.  Treated with IV diuresis. In future when requires IV diuresis, patient will require IV albumin pretreatment to avoid renal dysfunction.   7. Acute kidney injury. Recurrent urinary retention. Right-sided hydronephrosis. Nephrology was consulted.  Considers AKI secondary to hemodynamic issues. Patient had mild proteinuria. Ultrasound shows right-sided hydronephrosis which appears to be physiologic in the setting of pregnancy. Serological requested, HIV, HCV, HBV, C3-C4 and dsDNA as well as ANA are negative. Creatinine was trending upwards due to IV diuresis. Currently on hold. Foley catheter currently continued over the weekend.   8.  Pregnancy. 29 weeks and 5 days. Per MFM C-section planned at 34 weeks. Already received steroids for lung maturity. Management per OB.   9.  Right upper extremity DVT. Diagnosed on 7/17.  Currently on IV Lovenox. Due to upper GI bleed I will be holding Lovenox. Repeat ultrasound. Since the patient appears to have severe esophagitis and remains at risk for GI bleed, need to reconsider risk versus benefit of anticoagulation right upper extremity basilic vein DVT.   10.  Adrenal insufficiency. AM cortisol level 0.6. Patient received IV beclomethasone 2 days  ago prior to this test. Coastal stimulation test was positive.  1.3-4.6-6.5. Started on IV Solu-Cortef. Currently we will continue. Outpatient follow-up with endocrinologist. Will check prolactin, ACTH and thyroglobulin antibodies.   11.  Hypoalbuminemia. Third spacing. Intravascular volume depletion. Monitor for now. IV albumin support for diuresis in case it is needed in future.   12.  UTI. Completed treatment for 7 days of antibiotic.   13.  Poor veins. Patient is currently having poor access with issues with blood draw. Currently we will minimize blood draw since her hemoglobin is stable as well as serum creatinine is stable.     LDA. PICC line removed 8/25. Foley catheter removed 8/22 and reinserted on 8/22 due to retention. Core track removed 8/24.  Scheduled Meds:  cholecalciferol  1,000 Units Oral Daily   vitamin B-6  12.5 mg Oral BID   And   doxylamine (Sleep)  12.5 mg Oral BID   DULoxetine  20 mg Oral Daily   feeding supplement (GLUCERNA SHAKE)  237 mL Oral BID BM   hydrALAZINE  25 mg Oral Q8H   hydrocortisone sod succinate (SOLU-CORTEF) inj  50 mg Intravenous Q12H   mouth rinse  15 mL Mouth Rinse q12n4p   metoCLOPramide (REGLAN) injection  5 mg Intravenous Q6H   [START ON 01/21/2021] pantoprazole  40 mg Intravenous Q12H   prenatal vitamin w/FE, FA  1 tablet Oral Q1200   sodium chloride  1 spray Each Nare Q2H   sucralfate  1 g Oral TID WC & HS   vitamin B-12  1,000 mcg Oral Daily   Continuous Infusions:  dextrose 5% lactated ringers 50 mL/hr at 01/18/21 2212   famotidine (PEPCID) IV 20 mg (01/19/21 0953)  insulin 3.2 Units/hr (01/19/21 1807)   pantoprazole 8 mg/hr (01/19/21 1550)   PRN Meds: acetaminophen, dextrose, diphenhydrAMINE-zinc acetate, Glycerin (Adult), ondansetron, prochlorperazine, senna  Body mass index is 29.84 kg/m.  Nutrition Problem: Inadequate oral intake Etiology: nausea, vomiting     DVT Prophylaxis:   Place TED hose Start:  01/17/21 1655    Advance goals of care discussion: Pt is Full code.  Family Communication: no family was present at bedside, at the time of interview.   Data Reviewed: I have personally reviewed and interpreted daily labs, tele strips, imaging. Prior labs still pending.  CBG controlled.  Physical Exam:  General: Appear in mild distress, no Rash; Oral Mucosa Clear, moist. no Abnormal Neck Mass Or lumps, Conjunctiva normal  Cardiovascular: S1 and S2 Present, no Murmur, Respiratory: good respiratory effort, Bilateral Air entry present and CTA, no Crackles, no wheezes Abdomen: Bowel Sound present, Soft and no tenderness Extremities: Bilateral pedal edema Neurology: alert and oriented to time, place, and person affect appropriate. no new focal deficit Gait not checked due to patient safety concerns   Vitals:   01/18/21 2358 01/19/21 0341 01/19/21 0750 01/19/21 1146  BP: (!) 143/59 (!) 136/57 (!) 139/57 (!) 159/71  Pulse: 79 82 76 87  Resp: 18 18 18 18   Temp: 98 F (36.7 C) 98.4 F (36.9 C) 98.5 F (36.9 C) 98.2 F (36.8 C)  TempSrc: Oral Oral Oral Oral  SpO2:  98% 97% 98%  Weight:      Height:        Disposition:  Status is: Inpatient  Remains inpatient appropriate because:Ongoing diagnostic testing needed not appropriate for outpatient work up, IV treatments appropriate due to intensity of illness or inability to take PO, and Inpatient level of care appropriate due to severity of illness  Dispo: The patient is from: Home              Anticipated d/c is to: Home              Patient currently is not medically stable to d/c.   Difficult to place patient No        Time spent: 35 minutes. I reviewed all nursing notes, pharmacy notes, vitals, pertinent old records. I have discussed plan of care as described above with RN.  Author: , MD [facility] 01/19/2021 7:15 PM  To reach On-call, see care teams to locate the attending and reach out via  [email or web address]/21/2021. Between 7PM-7AM, please contact night-coverage If you still have difficulty reaching the attending provider, please page the [facility] (Director on Call) for [facility] on amion for assistance.

## 2021-01-19 NOTE — Progress Notes (Signed)
Nutrition Follow-up  DOCUMENTATION CODES:  Not applicable  INTERVENTION:  Current nutrition support: F/L diet - diet downgraded after episodes of hematemesis on 8/27. RN reports improving intake of liquids and yogurt. Glucerna BID (220 Kcal, 10 g protein ) - increase to TID if unable to advance to CHO mod GDM diet Prenatal vits B12 - absorption compromised by PPI B6 Vitamin D   25(OH)D  and B1 levels wnl   NUTRITION DIAGNOSIS:  Inadequate oral intake related to nausea, vomiting as evidenced by NPO status. Ongoing  GOAL:  Patient will meet greater than or equal to 90% of their needs Weight gain  MONITOR:  Diet advancement, Labs, Weight trends, TF tolerance, Skin, I & O's  REASON FOR ASSESSMENT:  History of malnutrition/hyperemesis  ASSESSMENT:  30 y.o. female with medical history of [redacted]w[redacted]d on admission , now 29 3/7 weeks , Type 1 diabetic, Cardiomyopathy, chronic systolic. Multiple recent admissions for hyperemesis/ pneumonia ( [facility] )  7/27- TF's  post-pyloric placement - discontinued 8/24 after endoscopy:erosive esophagitis   Lab Results  Component Value Date   HGBA1C 5.8 (H) 11/21/2020    Diet Order:   Diet Order             Diet full liquid Room service appropriate? Yes; Fluid consistency: Thin  Diet effective now                  EDUCATION NEEDS:   No education needs have been identified at this time  Skin:  Skin Assessment: Reviewed RN Assessment  Last BM: 8/28  Height:   Ht Readings from Last 1 Encounters:  12/17/20 5\' 1"  (1.549 m)    Weight:   Wt Readings from Last 1 Encounters:  01/17/21 71.6 kg    Ideal Body Weight:  47.7 kg  BMI:  Body mass index is 29.84 kg/m.  Estimated Nutritional Needs:   Kcal:  1700-1900  Protein:  90-105 grams  Fluid:  > 1.7 L  01/19/21 M.M LDN Neonatal Nutrition Support Specialist/RD III

## 2021-01-19 NOTE — Progress Notes (Signed)
Inpatient Diabetes Program Recommendations  Diabetes Treatment Program Recommendations  ADA Standards of Care 2018 Diabetes in Pregnancy Target Glucose Ranges:  Fasting: 60 - 90 mg/dL Preprandial: 60 - 625 mg/dL 1 hr postprandial: Less than 140mg /dL (from first bite of meal) 2 hr postprandial: Less than 120 mg/dL (from first bite of meal)      Lab Results  Component Value Date   GLUCAP 103 (H) 01/19/2021   HGBA1C 5.8 (H) 11/21/2020    Review of Glycemic Control Results for REAGEN, GOATES (MRN [name]) as of 01/19/2021 08:01  Ref. Range 01/16/2021 04:59 01/17/2021 06:08 01/18/2021 05:39 01/19/2021 06:53  Glucose-Capillary Latest Ref Range: 70 - 99 mg/dL 79 01/21/2021 (H) 87 876 (H)   Diabetes history: DM 1 Outpatient Diabetes medications: Omnipod with Novolog insulin  Most recent insulin pump rates from [facility]: 0.05 units/hr= 1.2 units in 24 hours, 1 units drops blood sugar 100 mg/dL, 1 units per 30 grams of CHO   In DM coordinator note from 11/20/2020: "Current pump settings are: 12-6am  0.15 units/hour 6am-12am 0.25 units/hour Total 5.4 units/24 hours  CF 80 (1 unit drops glucose 80 mg/dl) I:C 11/22/2020 (1 unit covers 20 grams of carbs) Target BG 100 mg/dl"   Current orders for Inpatient glycemic control:  IV insulin  Inpatient Diabetes Program Recommendations:    Noted hematemesis over weekend (8/27) and fluctuation of oral intake. Discussed patient with 08-15-2003, RN and nursing staff still continuing to assess oral intake this AM.  In addition, patient was started on [facility] for adrenal insufficiency.  Both of these factors make transition off iv insulin very difficult d/t fluctuating insulin needs. At this time, would recommend continuing Iv insulin until intake and plan for steroids are consistent as this aides with dosing.   Thanks, ESSENTIA HLTH [facility] [place], MSN, RNC-OB Diabetes Coordinator [phone] (8a-5p)

## 2021-01-19 NOTE — Progress Notes (Signed)
Patient ID: Ashley Camacho, female   DOB: 06-27-1990, 30 y.o.   MRN: [id number] FACULTY PRACTICE ANTEPARTUM COMPREHENSIVE PROGRESS NOTE   Ashley Camacho is a 30 y.o. G2P0101 at [redacted]w[redacted]d  who is admitted for N/V, DM1 complications in pregnancy.  Estimated Date of Delivery: 03/30/21 Fetal presentation is cephalic.   Length of Stay:  34 Days. Admitted 12/16/2020   Subjective: Today she is feeling her baseline. She denies any additional N/V after 2nd episode of hematemesis. She denies CP/SOB/abdominal pain. She may wish to advance her diet and have Foley catheter removed   Patient reports good fetal movement.  She reports no uterine contractions, no bleeding and no loss of fluid per vagina.   Blood pressure (!) 139/57, pulse 76, temperature 98.5 F (36.9 C), temperature source Oral, resp. rate 18, height 5\' 1"  (1.549 m), weight 71.6 kg, SpO2 97 %.  Physical Examination: CONSTITUTIONAL: Well-developed, well-nourished female in no acute distress.  NEUROLOGIC: Alert and oriented to person, place, and time. No cranial nerve deficit noted. PSYCHIATRIC: Normal mood and affect. Normal behavior. Normal judgment and thought content. CARDIOVASCULAR: Normal heart rate noted, regular rhythm RESPIRATORY: Effort and breath sounds normal, no problems with respiration noted MUSCULOSKELETAL: Normal range of motion. No edema and no tenderness. 2+ distal pulses. ABDOMEN: Soft, nontender, nondistended, gravid. CERVIX: Dilation: 2 Effacement (%): 60 Exam by:: Dr. [email or web address]    Uterine activity: irregular contractions    Lab Results Last 48 Hours        Results for orders placed or performed during the hospital encounter of 12/16/20 (from the past 48 hour(s))  Low molecular wgt heparin (fractionated)     Status: None    Collection Time: 01/16/21 10:54 AM  Result Value Ref Range    Heparin LMW 0.53 IU/mL      Comment:        THERAPEUTIC RANGE: DVT,PE,ACS on LMWH 1 mg/kg q12 at 4 hrs = 0.5-1.2  units/mL. DVT,PE on LMWH 1.5 mg/kg q24 at 4 hrs = 1-2 units/mL. Performed at [facility], [address], [place], [place]    Basic metabolic panel     Status: Abnormal    Collection Time: 01/16/21 10:54 AM  Result Value Ref Range    Sodium 139 135 - 145 mmol/L    Potassium 4.4 3.5 - 5.1 mmol/L    Chloride 110 98 - 111 mmol/L    CO2 20 (L) 22 - 32 mmol/L    Glucose, Bld 127 (H) 70 - 99 mg/dL      Comment: Glucose reference range applies only to samples taken after fasting for at least 8 hours.    BUN 33 (H) 6 - 20 mg/dL    Creatinine, Ser 01/18/21 (H) 0.44 - 1.00 mg/dL    Calcium 8.4 (L) 8.9 - 10.3 mg/dL    GFR, Estimated 54 (L) >60 mL/min      Comment: (NOTE) Calculated using the CKD-EPI Creatinine Equation (2021)      Anion gap 9 5 - 15      Comment: Performed at [facility], [address] [place], [place]  Magnesium     Status: Abnormal    Collection Time: 01/16/21  3:11 PM  Result Value Ref Range    Magnesium 2.5 (H) 1.7 - 2.4 mg/dL      Comment: Performed at [facility], [address], [place], [place]  Basic metabolic panel     Status: Abnormal  Collection Time: 01/17/21  4:39 AM  Result Value Ref Range    Sodium 139 135 - 145 mmol/L    Potassium 4.5 3.5 - 5.1 mmol/L    Chloride 111 98 - 111 mmol/L    CO2 20 (L) 22 - 32 mmol/L    Glucose, Bld 104 (H) 70 - 99 mg/dL      Comment: Glucose reference range applies only to samples taken after fasting for at least 8 hours.    BUN 29 (H) 6 - 20 mg/dL    Creatinine, Ser 9.14 (H) 0.44 - 1.00 mg/dL    Calcium 8.4 (L) 8.9 - 10.3 mg/dL    GFR, Estimated 60 (L) >60 mL/min      Comment: (NOTE) Calculated using the CKD-EPI Creatinine Equation (2021)      Anion gap 8 5 - 15      Comment: Performed at [facility], [address], [place], [place] [address]  CBC     Status: Abnormal    Collection Time: 01/17/21  4:39 AM  Result Value Ref Range    WBC 6.6 4.0 - 10.5 K/uL     RBC 2.88 (L) 3.87 - 5.11 MIL/uL    Hemoglobin 8.7 (L) 12.0 - 15.0 g/dL    HCT 62.1 (L) 30.8 - 46.0 %    MCV 92.0 80.0 - 100.0 fL    MCH 30.2 26.0 - 34.0 pg    MCHC 32.8 30.0 - 36.0 g/dL    RDW 65.7 84.6 - 96.2 %    Platelets 239 150 - 400 K/uL    nRBC 0.0 0.0 - 0.2 %      Comment: Performed at [facility], [address] [place], [place] [address]  Type and screen [facility]     Status: None    Collection Time: 01/17/21  4:39 AM  Result Value Ref Range    ABO/RH(D) A POS      Antibody Screen NEG      Sample Expiration          01/20/2021,2359 Performed at [facility], [address] [place], [place] [address]    Glucose, capillary     Status: Abnormal    Collection Time: 01/17/21  6:08 AM  Result Value Ref Range    Glucose-Capillary 116 (H) 70 - 99 mg/dL      Comment: Glucose reference range applies only to samples taken after fasting for at least 8 hours.  CBC     Status: Abnormal    Collection Time: 01/17/21  5:43 PM  Result Value Ref Range    WBC 8.7 4.0 - 10.5 K/uL    RBC 2.70 (L) 3.87 - 5.11 MIL/uL    Hemoglobin 8.3 (L) 12.0 - 15.0 g/dL    HCT 01.0 (L) 27.2 - 46.0 %    MCV 90.7 80.0 - 100.0 fL    MCH 30.7 26.0 - 34.0 pg    MCHC 33.9 30.0 - 36.0 g/dL    RDW 53.6 64.4 - 03.4 %    Platelets 248 150 - 400 K/uL    nRBC 0.2 0.0 - 0.2 %      Comment: Performed at [facility], [address], [place], [place] [address]  CBC     Status: Abnormal    Collection Time: 01/18/21  3:49 AM  Result Value Ref Range    WBC 8.7 4.0 - 10.5 K/uL    RBC 2.70 (L) 3.87 -  5.11 MIL/uL    Hemoglobin 8.1 (L) 12.0 - 15.0 g/dL    HCT 10.3 (L) 01.3 - 46.0 %    MCV 95.6 80.0 - 100.0 fL    MCH 30.0 26.0 - 34.0 pg    MCHC 31.4 30.0 - 36.0 g/dL    RDW 14.3 (H) 88.8 - 15.5 %    Platelets 219 150 - 400 K/uL    nRBC 0.0 0.0 - 0.2 %      Comment: Performed at [facility], [address] [place], [place] [address]  Glucose, capillary     Status: None     Collection Time: 01/18/21  5:39 AM  Result Value Ref Range    Glucose-Capillary 87 70 - 99 mg/dL      Comment: Glucose reference range applies only to samples taken after fasting for at least 8 hours.     CBG (last 3)  Recent Labs    01/17/21 0608 01/18/21 0539 01/19/21 0653  GLUCAP 116* 87 103*      Imaging Results (Last 48 hours)  No results found.     Current scheduled medications  cholecalciferol  1,000 Units Oral Daily   vitamin B-6  12.5 mg Oral BID    And   doxylamine (Sleep)  12.5 mg Oral BID   DULoxetine  20 mg Oral Daily   feeding supplement (GLUCERNA SHAKE)  237 mL Oral BID BM   hydrALAZINE  25 mg Oral Q8H   hydrocortisone sod succinate (SOLU-CORTEF) inj  50 mg Intravenous Q12H   mouth rinse  15 mL Mouth Rinse q12n4p   metoCLOPramide (REGLAN) injection  5 mg Intravenous Q6H   [START ON 01/21/2021] pantoprazole  40 mg Intravenous Q12H   prenatal vitamin w/FE, FA  1 tablet Oral Q1200   sodium chloride  1 spray Each Nare Q2H   sucralfate  1 g Oral TID WC & HS   vitamin B-12  1,000 mcg Oral Daily      I have reviewed the patient's current medications.   ASSESSMENT: Delivery with history of C-section   Acute kidney injury (HCC)   Hyperemesis   DKA, type 1, not at goal [facility])   Acute deep vein thrombosis (DVT) of brachial vein of right upper extremity (HCC)   Acute on chronic combined systolic and diastolic CHF (congestive heart failure) (HCC)   History of preterm delivery, currently pregnant   PLAN: 1) OB care Fetal Heart Rate A   Mode External  [removed] filed at 01/18/2021 1845  Baseline Rate (A) 130 bpm filed at 01/18/2021 1845  Variability 6-25 BPM filed at 01/18/2021 1845  Accelerations 15 x 15 filed at 01/18/2021 1845  Decelerations None filed at 01/18/2021 1845               BPP weekly, last completed 8/24             Growth q 4 wks, last completed 8/22             S/p BMZ 8/23-24 -MFM following- see their prior note for details   2)  GI Nausea/vomiting/esophagitis             Tolerating carb modified diet, Glucerna             Plan to slowly transition IV anti-emetics to oral             Continue pepcid, protonix, carafate             S/p EGD with  GI on 8/24             PPI drip and clear liquids for diet d/t hematemesis Diarrhea- improved, C. Diff neg Malnutrition             Dietician on board             B12 replacement per IM             On daily vitamin   3) ENDO - Type 1 DM             On endo tool             If pt continue to tolerate reg diet, will transition to insulin pump Mon.    - Adrenal insufficiency             Diagnosed on 8/26 and started on steriods   4) Neph/GU -UOP remains adequate -hesitant to remove foley due to prior requirement of re-insertion, will continue foley at this time -s/p Amox for UTI, will repeat culture next week   5) Heme -Anemia             Work up per IM Fecal occult positive- GI aware, in the s/o pregnancy, will hold off on colonoscopy at this time.  If Hgb worsens and assessment/intervention is warranted may reconsider  HgB stable this morning             -h/o right brachial VTE             Lovenox 55u bid - currently on hold due to recurrent hematemesis. IM team reviewed with heme             level followed by pharmacy   6) Cardio -HTN- currently on Hydralazine 25mg  q8hr - BP returned to baseline with administration of steroids   -s/p cardiology consult CHF stable   7) Maternal care -tylenol prn -encourage ambulation, PT/OT on board     DISP: Plan as outlined above, continue with supportive care with co-management per IM.  Goal for delivery @ 34wk via Repeat C-section and bilateral tubal sterilization if maternal/fetal well being remains stable. Medicaid tubal papers signed and on her paper chart.      Continue routine antenatal care.  , MD 01/19/2021 10:05 AM

## 2021-01-19 NOTE — Progress Notes (Signed)
Physical Therapy Treatment Patient Details Name: Ashley Camacho MRN: [id number] DOB: 07-11-1990 Today's Date: 01/19/2021    History of Present Illness 30 y.o. 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 Medical history significant of [redacted]w[redacted]d on presentation, Estimated Date of Delivery: 03/30/21 by by 9 week ultrasound however trying to make it to 34w for planned c-section, Type 1 diabetic, Cardiomyopathy, chronic systolic heart failure, DVT in bilateral arm. H/o recent hospitalizations d/t hyperemesis and sepsis secondary to pneumonia.    PT Comments    Pt with improved mobility and ambulation tolerance today. Pt denied pain and had no episodes of n/v. Pt required modA to don socks, pt with decreased hip ROM and limited ability leaning forward. Pt instructed to amb with RN staff to continue to improve strength and endurance.    Follow Up Recommendations  No PT follow up     Equipment Recommendations  None recommended by PT    Recommendations for Other Services       Precautions / Restrictions Precautions Precautions: Other (comment) Precaution Comments: swelling in LEs, pregnancy and CHF Restrictions Weight Bearing Restrictions: No    Mobility  Bed Mobility Overal bed mobility: Needs Assistance Bed Mobility: Supine to Sit;Sit to Supine Rolling: Min assist   Supine to sit: Min assist Sit to supine: Min assist   General bed mobility comments: pt at EOB    Transfers Overall transfer level: Needs assistance Equipment used: 1 person hand held assist Transfers: Sit to/from Stand Sit to Stand: Min guard Stand pivot transfers: Supervision       General transfer comment: Min guard assist for safety due to slow speed and some rocking to come up to standing.  Ambulation/Gait Ambulation/Gait assistance: Supervision Gait Distance (Feet): 150 Feet (x2) Assistive  device: 4-wheeled walker Gait Pattern/deviations: Step-through pattern;Wide base of support Gait velocity: decreased Gait velocity interpretation: <1.31 ft/sec, indicative of household ambulator General Gait Details: onset of fatigue requiring seated rest break, no Psychologist, sport and exercise    Modified Rankin (Stroke Patients Only)       Balance Overall balance assessment: Mild deficits observed, not formally tested                                          Cognition Arousal/Alertness: Awake/alert Behavior During Therapy: Flat affect Overall Cognitive Status: Within Functional Limits for tasks assessed                                 General Comments: pt can be self limiting, will try with encouragement      Exercises      General Comments General comments (skin integrity, edema, etc.): VSS      Pertinent Vitals/Pain Pain Assessment: Faces Faces Pain Scale: Hurts little more Pain Location: abdomen, sternum, minimally R LE Pain Descriptors / Indicators: Grimacing;Guarding Pain Intervention(s): Limited activity within patient's tolerance    Home Living                      Prior Function            PT Goals (current goals can now be found in the care plan section) Acute Rehab PT Goals  PT Goal Formulation: With patient Time For Goal Achievement: 01/30/21 Potential to Achieve Goals: Good Progress towards PT goals: Progressing toward goals    Frequency    Min 3X/week      PT Plan Current plan remains appropriate    Co-evaluation              AM-PAC PT "6 Clicks" Mobility   Outcome Measure  Help needed turning from your back to your side while in a flat bed without using bedrails?: None Help needed moving from lying on your back to sitting on the side of a flat bed without using bedrails?: None Help needed moving to and from a bed to a chair (including a wheelchair)?:  None Help needed standing up from a chair using your arms (e.g., wheelchair or bedside chair)?: A Little Help needed to walk in hospital room?: A Little Help needed climbing 3-5 steps with a railing? : A Little 6 Click Score: 21    End of Session   Activity Tolerance: Patient limited by fatigue Patient left: with call bell/phone within reach;in chair Nurse Communication: Mobility status;Other (comment) PT Visit Diagnosis: Other abnormalities of gait and mobility (R26.89);Muscle weakness (generalized) (M62.81)     Time: 8270-7867 PT Time Calculation (min) (ACUTE ONLY): 20 min  Charges:  $Gait Training: 8-22 mins                     Ashley Camacho, PT, DPT Acute Rehabilitation Services Pager #: [phone] Office #: [phone]    Ashley Camacho 01/19/2021, 2:49 PM

## 2021-01-20 ENCOUNTER — Inpatient Hospital Stay: Payer: Self-pay

## 2021-01-20 ENCOUNTER — Encounter: Payer: Self-pay | Admitting: Gastroenterology

## 2021-01-20 DIAGNOSIS — O34219 Maternal care for unspecified type scar from previous cesarean delivery: Secondary | ICD-10-CM

## 2021-01-20 DIAGNOSIS — O09899 Supervision of other high risk pregnancies, unspecified trimester: Secondary | ICD-10-CM

## 2021-01-20 LAB — BASIC METABOLIC PANEL
Anion gap: 5 (ref 5–15)
BUN: 23 mg/dL — ABNORMAL HIGH (ref 6–20)
CO2: 22 mmol/L (ref 22–32)
Calcium: 8.3 mg/dL — ABNORMAL LOW (ref 8.9–10.3)
Chloride: 110 mmol/L (ref 98–111)
Creatinine, Ser: 1 mg/dL (ref 0.44–1.00)
GFR, Estimated: >60 mL/min (ref >60)
Glucose, Bld: 107 mg/dL — ABNORMAL HIGH (ref 70–99)
Potassium: 4.1 mmol/L (ref 3.5–5.1)
Sodium: 137 mmol/L (ref 135–145)

## 2021-01-20 LAB — CBC
HCT: 23.7 % — ABNORMAL LOW (ref 36.0–46.0)
Hemoglobin: 8 g/dL — ABNORMAL LOW (ref 12.0–15.0)
MCH: 30.9 pg (ref 26.0–34.0)
MCHC: 33.8 g/dL (ref 30.0–36.0)
MCV: 91.5 fL (ref 80.0–100.0)
Platelets: 237 10*3/uL (ref 150–400)
RBC: 2.59 MIL/uL — ABNORMAL LOW (ref 3.87–5.11)
RDW: 15.8 % — ABNORMAL HIGH (ref 11.5–15.5)
WBC: 10.1 10*3/uL (ref 4.0–10.5)
nRBC: 0 % (ref 0.0–0.2)

## 2021-01-20 LAB — PROTIME-INR
INR: 1 (ref 0.8–1.2)
Prothrombin Time: 13.7 seconds (ref 11.4–15.2)

## 2021-01-20 LAB — PROLACTIN: Prolactin: 43.3 ng/mL — ABNORMAL HIGH (ref 4.8–23.3)

## 2021-01-20 LAB — ACTH: C206 ACTH: <1.5 pg/mL — ABNORMAL LOW (ref 7.2–63.3)

## 2021-01-20 LAB — HEPATIC FUNCTION PANEL
ALT: 28 U/L (ref 0–44)
AST: 26 U/L (ref 15–41)
Albumin: 2.3 g/dL — ABNORMAL LOW (ref 3.5–5.0)
Alkaline Phosphatase: 89 U/L (ref 38–126)
Bilirubin, Direct: 0.3 mg/dL — ABNORMAL HIGH (ref 0.0–0.2)
Indirect Bilirubin: 0.6 mg/dL (ref 0.3–0.9)
Total Bilirubin: 0.9 mg/dL (ref 0.3–1.2)
Total Protein: 4.6 g/dL — ABNORMAL LOW (ref 6.5–8.1)

## 2021-01-20 LAB — TYPE AND SCREEN
ABO/RH(D): A POS
Antibody Screen: NEGATIVE

## 2021-01-20 LAB — THYROGLOBULIN ANTIBODY: Thyroglobulin Antibody: <1 IU/mL (ref 0.0–0.9)

## 2021-01-20 LAB — APTT: aPTT: 26 seconds (ref 24–36)

## 2021-01-20 LAB — GLUCOSE, CAPILLARY: Glucose-Capillary: 107 mg/dL — ABNORMAL HIGH (ref 70–99)

## 2021-01-20 MED ORDER — HYDROCORTISONE NA SUCCINATE PF 100 MG IJ SOLR
40.0000 mg | Freq: Every day | INTRAMUSCULAR | Status: DC
  Administered 2021-01-27 – 2021-02-06 (×11): 40 mg via INTRAVENOUS
  Filled 2021-01-20 (×13): qty 0.8

## 2021-01-20 MED ORDER — HEPARIN (PORCINE) 25000 UT/250ML-% IV SOLN
21.0000 [IU]/kg/h | INTRAVENOUS | Status: DC
  Administered 2021-01-20: 17 [IU]/kg/h via INTRAVENOUS
  Administered 2021-01-21: 21 [IU]/kg/h via INTRAVENOUS
  Filled 2021-01-20 (×2): qty 250

## 2021-01-20 MED ORDER — HYDROCORTISONE NA SUCCINATE PF 100 MG IJ SOLR
60.0000 mg | Freq: Every day | INTRAMUSCULAR | Status: AC
  Administered 2021-01-23 – 2021-01-24 (×2): 60 mg via INTRAVENOUS
  Filled 2021-01-20 (×2): qty 1.2

## 2021-01-20 MED ORDER — CHLORHEXIDINE GLUCONATE CLOTH 2 % EX PADS
6.0000 | MEDICATED_PAD | Freq: Every day | CUTANEOUS | Status: DC
  Administered 2021-01-21 – 2021-03-04 (×41): 6 via TOPICAL

## 2021-01-20 MED ORDER — HYDROCORTISONE NA SUCCINATE PF 100 MG IJ SOLR
70.0000 mg | Freq: Every day | INTRAMUSCULAR | Status: AC
  Administered 2021-01-21 – 2021-01-22 (×2): 70 mg via INTRAVENOUS
  Filled 2021-01-20 (×2): qty 1.4

## 2021-01-20 MED ORDER — SODIUM CHLORIDE 0.9% FLUSH
10.0000 mL | INTRAVENOUS | Status: DC | PRN
  Administered 2021-01-22 – 2021-01-26 (×3): 10 mL
  Administered 2021-02-10 (×2): 20 mL
  Administered 2021-02-18: 10 mL

## 2021-01-20 MED ORDER — SODIUM CHLORIDE 0.9% FLUSH
10.0000 mL | Freq: Two times a day (BID) | INTRAVENOUS | Status: DC
  Administered 2021-01-21 – 2021-02-08 (×24): 10 mL
  Administered 2021-02-09: 20 mL
  Administered 2021-02-09: 10 mL
  Administered 2021-02-11: 20 mL
  Administered 2021-02-12: 10 mL
  Administered 2021-02-14: 20 mL
  Administered 2021-02-14 – 2021-02-22 (×12): 10 mL
  Administered 2021-02-22: 20 mL
  Administered 2021-02-22 – 2021-02-25 (×8): 10 mL
  Administered 2021-02-26: 20 mL
  Administered 2021-02-26 – 2021-03-04 (×14): 10 mL

## 2021-01-20 MED ORDER — HYDROCORTISONE NA SUCCINATE PF 100 MG IJ SOLR
50.0000 mg | Freq: Every day | INTRAMUSCULAR | Status: AC
  Administered 2021-01-25 – 2021-01-26 (×2): 50 mg via INTRAVENOUS
  Filled 2021-01-20 (×2): qty 1

## 2021-01-20 NOTE — Progress Notes (Signed)
Patient presents with double lumen PICC line. One infusing with insulin and the other with heparin. Protonix IVPB to be administered, not compatible with either. RN spoke with Pharamcist and Hospitalist. Hospitalist reports patient cannot have PO and for RN to pause one infusing line to administer protonix. Lab draws also due. Per pharmacist, RN to pause heparin drip, waste 20-86ml and then draw for sample. RN to run protonix during paused time.  Raelyn Ensign, RN

## 2021-01-20 NOTE — Progress Notes (Signed)
[facility] Progress Note  Patient: Ashley Camacho    EZM:[id number]  DOA: 12/16/2020     Date of Service: the patient was seen and examined on 01/20/2021  Brief hospital course: 30 year old female with past medical history of type 1 diabetes, cardiomyopathy, chronic systolic CHF who is 24 weeks 4 days pregnant on presentation.  Presents with complaints of intractable nausea and vomiting. 6/30 admission to [facility] for nausea and vomiting. 7/4-7/20 transferred to [facility] for pneumonia.  Due to rhinovirus. 7/22-7/26 admitted to [facility] again with starvation ketosis, also DVT of brachial vein.  Also started on TPN, PICC line inserted. 7/26 admitted to [facility] for Cortrak placement 7/27 cardiology consulted for cardiomyopathy, PICC line inserted, tube feeding started. 7/29 having recurrent retention, Foley catheter inserted 7/30 PRBC transfused 8/5 PRBC transfused 8/16 cardiology signed off, no follow-up recommended 8/19 nephrology consulted, no follow-up recommended 8/20 PRBC transfused 8/21 rescue course of betamethasone 8/22 Foley catheter reinserted after removal due to retention. 8/23 GI consulted. MFM consulted, nephrology signed off. 8/24 underwent EGD shows severe esophagitis with mild oozing.   Dobbhoff removed patient on oral diet. 8/25 PICC line removed. 8/26 started on steroids for adrenal insufficiency. 8/27 2 episodes of hematemesis.  Started on PPI drip.  GI reconsulted.  Lovenox held. 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 8/30 PICC line reinserted due to poor IV access, difficulty with blood draws and need for IV heparin.  Currently plan is monitor for stabilization of CBC,   Subjective: Denies any nausea or vomiting.  Unable to sleep last night therefore sleepy right now.  No abdominal pain.  Continues to have indigestion.  Had bowel movement without any blood.  No chest pain.  Assessment  and Plan: 1.  Severe esophagitis with upper GI bleed SP EGD by [facility] on 8/24. Suspect initial hyperemesis gravidarum caused severe esophagitis which is now creating further problem with ongoing nausea vomiting and indigestion. Had 2 episodes of vomiting on 8/27 with hematemesis. H&H relatively stable. Patient was on IV Protonix twice daily and oral Pepcid and Carafate. GI reconsulted due to rebleeding.,  DW Dr. Elnoria Howard Had Lovenox. Was on Protonix drip.  Now after 72 hours back on IV Protonix twice daily. Switching Pepcid to IV.  Continue Carafate.  Advancing to soft diet. Discussed with GI on 8/30, recommend to start on IV heparin and call them back if she has recurrent bleeding.   2. Intractable nausea and vomiting. Food aversion. Gastroparesis  Hyperemesis gravidarum Adult failure to thrive. Moderate protein calorie malnutrition.  Now resolved. Initially patient was on TPN.  Transfer to [facility] for core track placement from [facility]. Core track placed.  Patient was on tube feeding. EGD performed.  Shows esophagitis. Do not think the patient is suffering from gastroparesis.   GI also agrees. Patient was on scheduled Reglan and Zofran.  Currently on scheduled Reglan and as needed Zofran. Continue PPI and Pepcid. Continues to have bowel movement. Avoid Imodium.   Ultrasound abdomen shows no significant abnormality related to liver or pancreas. Was on Remeron that did not work for improvement in oral intake. Initial plan was to continue core track until delivery at 34 weeks.  Now removed and patient is able to tolerate p.o. diet.   3.  Normocytic anemia. Multifactorial.  Combination of anemia of pregnancy, GI bleed, nutritional deficiency as well as volume overload. Hemoglobin is remaining stable for now. Anemia existing since May 2022. Hematology  has ablated patient outpatient and had significant work-up. B12 is relatively low currently being suppressed. Not obtainable  colonoscopy in the setting of ongoing pregnancy and relative stability of hemoglobin. Continue supportive measures.   4.  Nutritional deficiency. Currently resident B12, vitamin D supplementation.  Other mineral and vitamin levels were relatively normal.   5.  Type 1 diabetes mellitus with hyperand hypoglycemia, on long-term insulin therapy with insulin pump and neuropathy as well as gastroparesis complication On insulin pump at home. Currently on IV insulin with Endo tool. Also on D5 LR for hypoglycemic support. Expecting fluctuation while the patient is receiving IV steroids.   6.  Acute on chronic systolic CHF Echocardiogram showed EF of 45%. Repeat echocardiogram actually showed improvement and normalization of EF. Currently was consulted. On presentation 2016: Had volume overload.  Treated with IV diuresis. In future when requires IV diuresis, patient will require IV albumin pretreatment to avoid renal dysfunction.   7. Acute kidney injury. Recurrent urinary retention. Right-sided hydronephrosis. Nephrology was consulted.  Considers AKI secondary to hemodynamic issues. Patient had mild proteinuria. Ultrasound shows right-sided hydronephrosis which appears to be physiologic in the setting of pregnancy. Serological requested, HIV, HCV, HBV, C3-C4 and dsDNA as well as ANA are negative. Creatinine was trending upwards due to IV diuresis. Currently on hold. Foley catheter currently continued, likely can remove it on 8/31   8.  Pregnancy. 29 weeks and 5 days. Per MFM C-section planned at 34 weeks. Already received steroids for lung maturity. Management per OB.   9.  Right upper extremity DVT. Diagnosed on 7/17. Was on Lovenox. Due to upper GI bleed Lovenox was held over the weekend Now that the GI bleed has resolved.  Patient continues to have some DVT in the right upper extremity.  Started on IV heparin without bolus.  Monitor response. PICC line inserted for recurrent blood  draws requirement.   10.  Adrenal insufficiency. AM cortisol level 0.6. Patient received IV beclomethasone 2 days ago prior to this test. Coastal stimulation test was positive.  1.3-4.6-6.5. Started on IV Solu-Cortef.  Taper ordered.  Ideally patient will be on 20 mg Solu-Cortef in the morning and 10 mg in the evening. Currently we will continue. Outpatient follow-up with endocrinologist. Procalcitonin level elevated, ACTH level undetectable, thyroglobulin antibodies currently pending.  11.  Hypoalbuminemia. Third spacing. Intravascular volume depletion. Monitor for now. IV albumin support for diuresis in case it is needed in future.   12.  UTI. Completed treatment for 7 days of antibiotic.   13.  Poor veins. PICC line inserted.  Again on 8/30.    LDA. PICC line removed 8/25. Foley catheter removed 8/22 and reinserted on 8/22 due to retention. Core track removed 8/24.  Scheduled Meds:  Chlorhexidine Gluconate Cloth  6 each Topical Daily   cholecalciferol  1,000 Units Oral Daily   vitamin B-6  12.5 mg Oral BID   And   doxylamine (Sleep)  12.5 mg Oral BID   DULoxetine  20 mg Oral Daily   feeding supplement (GLUCERNA SHAKE)  237 mL Oral BID BM   hydrALAZINE  25 mg Oral Q8H   [START ON 01/21/2021] hydrocortisone sod succinate (SOLU-CORTEF) inj  70 mg Intravenous Daily   Followed by   Melene Muller ON 01/23/2021] hydrocortisone sod succinate (SOLU-CORTEF) inj  60 mg Intravenous Daily   Followed by   Melene Muller ON 01/25/2021] hydrocortisone sod succinate (SOLU-CORTEF) inj  50 mg Intravenous Daily   Followed by   Melene Muller ON 01/27/2021] hydrocortisone sod succinate (  SOLU-CORTEF) inj  40 mg Intravenous Daily   mouth rinse  15 mL Mouth Rinse q12n4p   metoCLOPramide (REGLAN) injection  5 mg Intravenous Q6H   [START ON 01/21/2021] pantoprazole  40 mg Intravenous Q12H   prenatal vitamin w/FE, FA  1 tablet Oral Q1200   sodium chloride  1 spray Each Nare Q2H   sodium chloride flush  10-40 mL  Intracatheter Q12H   sucralfate  1 g Oral TID WC & HS   vitamin B-12  1,000 mcg Oral Daily   Continuous Infusions:  dextrose 5% lactated ringers 50 mL/hr at 01/20/21 1824   famotidine (PEPCID) IV     heparin 17 Units/kg/hr (01/20/21 1815)   insulin 1.1 Units/hr (01/20/21 1828)   PRN Meds: acetaminophen, dextrose, diphenhydrAMINE-zinc acetate, Glycerin (Adult), ondansetron, prochlorperazine, senna, sodium chloride flush  Body mass index is 29.99 kg/m.  Nutrition Problem: Inadequate oral intake Etiology: nausea, vomiting     DVT Prophylaxis:   Place TED hose Start: 01/17/21 1655    Advance goals of care discussion: Pt is Full code.  Family Communication: no family was present at bedside, at the time of interview.   Data Reviewed: I have personally reviewed and interpreted daily labs, tele strips, imaging. Hemoglobin stable.  Creatinine normal.  Electrolytes stable.  Physical Exam: General: Appear in mild distress, no Rash; Oral Mucosa Clear, moist. no Abnormal Neck Mass Or lumps, Conjunctiva normal  Cardiovascular: S1 and S2 Present, no Murmur, Respiratory: good respiratory effort, Bilateral Air entry present and CTA, no Crackles, no wheezes Abdomen: Bowel Sound present, Soft and no tenderness Extremities: trace Pedal edema Neurology: alert and oriented to time, place, and person affect appropriate. no new focal deficit Gait not checked due to patient safety concerns   Vitals:   01/20/21 0605 01/20/21 0743 01/20/21 1139 01/20/21 1544  BP:  (!) 140/56 (!) 148/60 (!) 144/61  Pulse:  78 92 85  Resp:  18 18 18   Temp:  98 F (36.7 C) 98 F (36.7 C) 98.2 F (36.8 C)  TempSrc:  Oral Oral Oral  SpO2:  97% 97% 98%  Weight: 72 kg     Height:        Disposition:  Status is: Inpatient  Remains inpatient appropriate because:Ongoing diagnostic testing needed not appropriate for outpatient work up, IV treatments appropriate due to intensity of illness or inability to take PO,  and Inpatient level of care appropriate due to severity of illness  Dispo: The patient is from: Home              Anticipated d/c is to: Home              Patient currently is not medically stable to d/c.   Difficult to place patient No        Time spent: 35 minutes. I reviewed all nursing notes, pharmacy notes, vitals, pertinent old records. I have discussed plan of care as described above with RN.  Author: , MD [facility] 01/20/2021 7:51 PM  To reach On-call, see care teams to locate the attending and reach out via [email or web address]/[email or web address]. Between 7PM-7AM, please contact night-coverage If you still have difficulty reaching the attending provider, please page the [facility] (Director on Call) for [facility] on amion for assistance.

## 2021-01-20 NOTE — Progress Notes (Signed)
Peripherally Inserted Central Catheter Placement  The IV Nurse has discussed with the patient and/or persons authorized to consent for the patient, the purpose of this procedure and the potential benefits and risks involved with this procedure.  The benefits include less needle sticks, lab draws from the catheter, and the patient may be discharged home with the catheter. Risks include, but not limited to, infection, bleeding, blood clot (thrombus formation), and puncture of an artery; nerve damage and irregular heartbeat and possibility to perform a PICC exchange if needed/ordered by physician.  Alternatives to this procedure were also discussed.  Bard Power PICC patient education guide, fact sheet on infection prevention and patient information card has been provided to patient /or left at bedside.    PICC Placement Documentation  PICC Double Lumen 01/20/21 PICC Left Brachial 34 cm 1 cm (Active)  Indication for Insertion or Continuance of Line Poor Vasculature-patient has had multiple peripheral attempts or PIVs lasting less than 24 hours 01/20/21 1700  Exposed Catheter (cm) 1 cm 01/20/21 1700  Site Assessment Clean;Dry;Intact 01/20/21 1700  Lumen #1 Status Flushed;Saline locked;Blood return noted 01/20/21 1700  Lumen #2 Status Flushed;Saline locked;Blood return noted 01/20/21 1700  Dressing Type Transparent;Securing device 01/20/21 1700  Dressing Status Clean;Dry;Intact 01/20/21 1700  Antimicrobial disc in place? Yes 01/20/21 1700  Safety Lock Not Applicable 01/20/21 1700  Line Care Connections checked and tightened 01/20/21 1700  Dressing Intervention New dressing 01/20/21 1700  Dressing Change Due 01/27/21 01/20/21 1700       Timmothy Sours 01/20/2021, 5:12 PM

## 2021-01-20 NOTE — Progress Notes (Signed)
Patient ID: Ashley Camacho, female   DOB: 27-Jun-1990, 30 y.o.   MRN: [id number] FACULTY PRACTICE ANTEPARTUM COMPREHENSIVE PROGRESS NOTE   Ashley Camacho is a 30 y.o. G2P0101 at [redacted]w[redacted]d  who is admitted for N/V, DM1 complications in pregnancy.  Estimated Date of Delivery: 03/30/21 Fetal presentation is cephalic.   Length of Stay:  35 Days. Admitted 12/16/2020   Subjective: Today she is feeling her baseline. She denies any additional N/V after 2nd episode of hematemesis. She denies CP/SOB/abdominal pain. Foley catheter removed   Patient reports good fetal movement.  She reports no uterine contractions, no bleeding and no loss of fluid per vagina.   Blood pressure (!) 148/60, pulse 92, temperature 98 F (36.7 C), temperature source Oral, resp. rate 18, height 5\' 1"  (1.549 m), weight 72 kg, SpO2 97 %.    Physical Examination: CONSTITUTIONAL: Well-developed, well-nourished female in no acute distress.  NEUROLOGIC: Alert and oriented to person, place, and time. No cranial nerve deficit noted. PSYCHIATRIC: Normal mood and affect. Normal behavior. Normal judgment and thought content. CARDIOVASCULAR: Normal heart rate noted, regular rhythm RESPIRATORY: Effort and breath sounds normal, no problems with respiration noted MUSCULOSKELETAL: Normal range of motion. No edema and no tenderness. 2+ distal pulses. ABDOMEN: Soft, nontender, nondistended, gravid. CERVIX: Dilation: 2 Effacement (%): 60 Exam by:: Dr. [email or web address]     Uterine activity: irregular contractions    Lab Results Last 48 Hours             Results for orders placed or performed during the hospital encounter of 12/16/20 (from the past 48 hour(s))  Low molecular wgt heparin (fractionated)     Status: None    Collection Time: 01/16/21 10:54 AM  Result Value Ref Range    Heparin LMW 0.53 IU/mL      Comment:        THERAPEUTIC RANGE: DVT,PE,ACS on LMWH 1 mg/kg q12 at 4 hrs = 0.5-1.2 units/mL. DVT,PE on LMWH 1.5 mg/kg q24 at 4 hrs =  1-2 units/mL. Performed at [facility], [address], [place], [place]    Basic metabolic panel     Status: Abnormal    Collection Time: 01/16/21 10:54 AM  Result Value Ref Range    Sodium 139 135 - 145 mmol/L    Potassium 4.4 3.5 - 5.1 mmol/L    Chloride 110 98 - 111 mmol/L    CO2 20 (L) 22 - 32 mmol/L    Glucose, Bld 127 (H) 70 - 99 mg/dL      Comment: Glucose reference range applies only to samples taken after fasting for at least 8 hours.    BUN 33 (H) 6 - 20 mg/dL    Creatinine, Ser 01/18/21 (H) 0.44 - 1.00 mg/dL    Calcium 8.4 (L) 8.9 - 10.3 mg/dL    GFR, Estimated 54 (L) >60 mL/min      Comment: (NOTE) Calculated using the CKD-EPI Creatinine Equation (2021)      Anion gap 9 5 - 15      Comment: Performed at [facility], [address], [place], [place]  Magnesium     Status: Abnormal    Collection Time: 01/16/21  3:11 PM  Result Value Ref Range    Magnesium 2.5 (H) 1.7 - 2.4 mg/dL      Comment: Performed at [facility], [address] [place], [place]  Basic metabolic panel     Status: Abnormal  Collection Time: 01/17/21  4:39 AM  Result Value Ref Range    Sodium 139 135 - 145 mmol/L    Potassium 4.5 3.5 - 5.1 mmol/L    Chloride 111 98 - 111 mmol/L    CO2 20 (L) 22 - 32 mmol/L    Glucose, Bld 104 (H) 70 - 99 mg/dL      Comment: Glucose reference range applies only to samples taken after fasting for at least 8 hours.    BUN 29 (H) 6 - 20 mg/dL    Creatinine, Ser 9.44 (H) 0.44 - 1.00 mg/dL    Calcium 8.4 (L) 8.9 - 10.3 mg/dL    GFR, Estimated 60 (L) >60 mL/min      Comment: (NOTE) Calculated using the CKD-EPI Creatinine Equation (2021)      Anion gap 8 5 - 15      Comment: Performed at [facility], [address], [place], [place] [address]  CBC     Status: Abnormal    Collection Time: 01/17/21  4:39 AM  Result Value Ref Range    WBC 6.6 4.0 - 10.5 K/uL    RBC 2.88 (L) 3.87 - 5.11 MIL/uL    Hemoglobin 8.7  (L) 12.0 - 15.0 g/dL    HCT 12.2 (L) 24.1 - 46.0 %    MCV 92.0 80.0 - 100.0 fL    MCH 30.2 26.0 - 34.0 pg    MCHC 32.8 30.0 - 36.0 g/dL    RDW 14.6 43.1 - 42.7 %    Platelets 239 150 - 400 K/uL    nRBC 0.0 0.0 - 0.2 %      Comment: Performed at [facility], [address], [place], [place] [address]  Type and screen [facility]     Status: None    Collection Time: 01/17/21  4:39 AM  Result Value Ref Range    ABO/RH(D) A POS      Antibody Screen NEG      Sample Expiration          01/20/2021,2359 Performed at [facility], [address] [place], [place] [address]    Glucose, capillary     Status: Abnormal    Collection Time: 01/17/21  6:08 AM  Result Value Ref Range    Glucose-Capillary 116 (H) 70 - 99 mg/dL      Comment: Glucose reference range applies only to samples taken after fasting for at least 8 hours.  CBC     Status: Abnormal    Collection Time: 01/17/21  5:43 PM  Result Value Ref Range    WBC 8.7 4.0 - 10.5 K/uL    RBC 2.70 (L) 3.87 - 5.11 MIL/uL    Hemoglobin 8.3 (L) 12.0 - 15.0 g/dL    HCT 61.1 (L) 64.3 - 46.0 %    MCV 90.7 80.0 - 100.0 fL    MCH 30.7 26.0 - 34.0 pg    MCHC 33.9 30.0 - 36.0 g/dL    RDW 53.9 12.2 - 58.3 %    Platelets 248 150 - 400 K/uL    nRBC 0.2 0.0 - 0.2 %      Comment: Performed at [facility], [address] [place], [place] [address]  CBC     Status: Abnormal    Collection Time: 01/18/21  3:49 AM  Result Value Ref Range    WBC 8.7 4.0 - 10.5 K/uL    RBC 2.70 (L) 3.87 -  5.11 MIL/uL    Hemoglobin 8.1 (L) 12.0 - 15.0 g/dL    HCT 60.6 (L) 30.1 - 46.0 %    MCV 95.6 80.0 - 100.0 fL    MCH 30.0 26.0 - 34.0 pg    MCHC 31.4 30.0 - 36.0 g/dL    RDW 60.1 (H) 09.3 - 15.5 %    Platelets 219 150 - 400 K/uL    nRBC 0.0 0.0 - 0.2 %      Comment: Performed at [facility], [address], [place], [place] [address]  Glucose, capillary     Status: None    Collection Time: 01/18/21  5:39 AM  Result  Value Ref Range    Glucose-Capillary 87 70 - 99 mg/dL      Comment: Glucose reference range applies only to samples taken after fasting for at least 8 hours.     CBG (last 3)  Recent Labs (last 2 labs)        Recent Labs    01/17/21 0608 01/18/21 0539 01/19/21 0653  GLUCAP 116* 87 103*          Imaging Results (Last 48 hours)  No results found.      Current scheduled medications  cholecalciferol  1,000 Units Oral Daily   vitamin B-6  12.5 mg Oral BID    And   doxylamine (Sleep)  12.5 mg Oral BID   DULoxetine  20 mg Oral Daily   feeding supplement (GLUCERNA SHAKE)  237 mL Oral BID BM   hydrALAZINE  25 mg Oral Q8H   hydrocortisone sod succinate (SOLU-CORTEF) inj  50 mg Intravenous Q12H   mouth rinse  15 mL Mouth Rinse q12n4p   metoCLOPramide (REGLAN) injection  5 mg Intravenous Q6H   [START ON 01/21/2021] pantoprazole  40 mg Intravenous Q12H   prenatal vitamin w/FE, FA  1 tablet Oral Q1200   sodium chloride  1 spray Each Nare Q2H   sucralfate  1 g Oral TID WC & HS   vitamin B-12  1,000 mcg Oral Daily      I have reviewed the patient's current medications.   ASSESSMENT: Delivery with history of C-section   Acute kidney injury (HCC)   Hyperemesis   DKA, type 1, not at goal [facility])   Acute deep vein thrombosis (DVT) of brachial vein of right upper extremity (HCC)   Acute on chronic combined systolic and diastolic CHF (congestive heart failure) (HCC)   History of preterm delivery, currently pregnant   PLAN: 1) OB care   Fetal Heart Rate A   Mode External filed at 01/20/2021 1218  Baseline Rate (A) 145 bpm filed at 01/20/2021 1218  Variability 6-25 BPM filed at 01/20/2021 1218  Accelerations 10 x 10 filed at 01/20/2021 1218  Decelerations Variable filed at 01/20/2021 1218               BPP weekly, last completed 8/24             Growth q 4 wks, last completed 8/22             S/p BMZ 8/23-24 -MFM following- see their prior note for details   2)  GI Nausea/vomiting/esophagitis             Tolerating carb modified diet, Glucerna             Plan to slowly transition IV anti-emetics to oral             Continue pepcid,  protonix, carafate             S/p EGD with GI on 8/24             PPI drip and clear liquids for diet d/t hematemesis Diarrhea- improved, C. Diff neg Malnutrition             Dietician on board             B12 replacement per IM             On daily vitamin  She may wish to advance her diet   3) ENDO - Type 1 DM             On endo tool- patient has fluctuating needs and may still need IV dosing but will see if pump can be started soon               - Adrenal insufficiency             Diagnosed on 8/26 and started on steriods   4) Neph/GU -UOP remains adequate -hesitant to remove foley due to prior requirement of re-insertion, will continue foley at this time -s/p Amox for UTI, will repeat culture next week   5) Heme -Anemia             Work up per IM Fecal occult positive- GI aware, in the s/o pregnancy, will hold off on colonoscopy at this time.  If Hgb worsens and assessment/intervention is warranted may reconsider  HgB stable this morning             -h/o right brachial VTE             Lovenox 55u bid - currently on hold due to recurrent hematemesis. IM team reviewed with heme             level followed by pharmacy   6) Cardio -HTN- currently on Hydralazine 25mg  q8hr - BP returned to baseline with administration of steroids   -s/p cardiology consult CHF stable   7) Maternal care -tylenol prn -encourage ambulation, PT/OT on board   Plan as outlined above, continue with supportive care with co-management per IM.  Goal for delivery @ 34wk via Repeat C-section and bilateral tubal sterilization if maternal/fetal well being remains stable. Medicaid tubal papers signed and on her paper chart.      Continue routine antenatal care.  , MD 01/20/2021 2:33 PM

## 2021-01-20 NOTE — Progress Notes (Addendum)
Inpatient Diabetes Program Recommendations  Diabetes Treatment Program Recommendations  ADA Standards of Care 2018 Diabetes in Pregnancy Target Glucose Ranges:  Fasting: 60 - 90 mg/dL Preprandial: 60 - 031 mg/dL 1 hr postprandial: Less than 140mg /dL (from first bite of meal) 2 hr postprandial: Less than 120 mg/dL (from first bite of meal)      Lab Results  Component Value Date   GLUCAP 107 (H) 01/20/2021   HGBA1C 5.8 (H) 11/21/2020    Review of Glycemic Control Results for Ashley Camacho, Ashley Camacho (MRN [name]) as of 01/20/2021 15:24  Ref. Range 01/17/2021 06:08 01/18/2021 05:39 01/19/2021 06:53 01/20/2021 06:05  Glucose-Capillary Latest Ref Range: 70 - 99 mg/dL 01/22/2021 (H) 87 244 (H) 628 (H)   Diabetes history: DM 1 Outpatient Diabetes medications: Omnipod with Novolog insulin  Most recent insulin pump rates from [facility]: 0.05 units/hr= 1.2 units in 24 hours, 1 units drops blood sugar 100 mg/dL, 1 units per 30 grams of CHO   In DM coordinator note from 11/20/2020: "Current pump settings are: 12-6am  0.15 units/hour 6am-12am 0.25 units/hour Total 5.4 units/24 hours  CF 80 (1 unit drops glucose 80 mg/dl) I:C 11/22/2020 (1 unit covers 20 grams of carbs) Target BG 100 mg/dl"   Current orders for Inpatient glycemic control:  IV insulin Solucortef 50 mg BID with taper to begin and goal to 30 mg QD  Inpatient Diabetes Program Recommendations:    Spoke with patient at bedside. More conversant today and smiling during conversation.  CGM 7 days old. Will need to be replaced in 7 days. Functioning appropriately.  Paged received from Dr 9 regarding thoughts to transitioning to insulin pump. However, patient does not have insulin pump or supplies at hospital and does not have anyone to bring supplies. Also, unaware of if she has all the supplies she would need for reapplication. Patient usually wears Omnipod. At this time, would need subcutaneous insulin IF plan is to transition.  Education provided to  patient on current drip rates while on IV insulin vs home insulin pump settings, intake, impact of steroids, risks involved and plan for diet. Patient denies vomiting in last 36-48 hours; nausea only.  When discussing intake, patient sipping slowly on liquids throughout day. Does not feel ready for a "meal". This would make CHO coverage difficult.  Extremely guarded on transitioning off IV insulin. Recommend continuing IV insulin given taper of steroids and reassessment of intake.  Secure chat sent to Dr Debroah Loop regarding plan for [facility]. Discussed plan of care with Dr ESSENTIA [facility] [place] regarding transition. Concerns addressed for inconsistent intake, unknown plan for steroids, inconsistent dosing compared with outpatient settings. Patient will be getting PICC line and plans to remain inpatient until delivery.  Recommend assessing intake over next 24 hours for more consistency.   Following.   Addendum @1531 : Per Dr Debroah Loop: Plan to start taper of Solucortef. Goal to PO and 30 mg QD. Secure chat sent regarding insulin pump, lack of supplies, recommendation for continuation of IV insulin, Dr goal of transition and discussion with goals over next 24 hours, and difficulty with inconsistent intake.   Thanks, Allena Katz, MSN, RNC-OB Diabetes Coordinator [phone] (8a-5p)

## 2021-01-20 NOTE — Progress Notes (Signed)
ANTICOAGULATION CONSULT NOTE - Initial Consult  Pharmacy Consult for Heparin Indication: VTE prophylaxis  No Known Allergies  Patient Measurements: Height: 5\' 1"  (154.9 cm) Weight: 72 kg (158 lb 11.7 oz) IBW/kg (Calculated) : 47.8 Heparin Dosing Weight: 63.4 kg  Vital Signs: Temp: 98 F (36.7 C) (08/30 1139) Temp Source: Oral (08/30 1139) BP: 148/60 (08/30 1139) Pulse Rate: 92 (08/30 1139)  Labs: Recent Labs    01/17/21 1743 01/18/21 0349 01/20/21 0553  HGB 8.3* 8.1* 8.0*  HCT 24.5* 25.8* 23.7*  PLT 248 219 237  CREATININE  --   --  1.00    Estimated Creatinine Clearance: 75.3 mL/min (by C-G formula based on SCr of 1 mg/dL).   Medical History: Past Medical History:  Diagnosis Date   Anemia    Gastroparesis    Hypertension    Type 1 diabetes (HCC)      Assessment: Acute DVT of brachial vein of right upper extremity Goal of Therapy:  Heparin level 0.3-0.7 units/ml Monitor platelets by anticoagulation protocol: Yes  Plan:  1) Start heparin infusion at 1,100 units/hr ,  17 units/kg/hr, no bolus needed per Dr. 01/22/21 2) Will order 6 hr heparin level and adjust to achieve therapeutic goal. 3) Follow daily PTT,PT/INR, CBC   Ashley Camacho, Ashley Camacho 01/20/2021,3:04 PM

## 2021-01-20 NOTE — Progress Notes (Signed)
Spoke with primary RN regarding current access and orders. Discussed pros/cons of continuing with peripheral sites vs. Considering CVC/PICC. Recommend CVC/PICC due to expected prolonged length of stay and limited veins.

## 2021-01-21 LAB — APTT: aPTT: 61 seconds — ABNORMAL HIGH (ref 24–36)

## 2021-01-21 LAB — HEPARIN LEVEL (UNFRACTIONATED)
Heparin Unfractionated: 0.2 IU/mL — ABNORMAL LOW (ref 0.30–0.70)
Heparin Unfractionated: 0.22 IU/mL — ABNORMAL LOW (ref 0.30–0.70)
Heparin Unfractionated: 0.46 IU/mL (ref 0.30–0.70)

## 2021-01-21 LAB — PROTIME-INR
INR: 1.1 (ref 0.8–1.2)
Prothrombin Time: 14.1 seconds (ref 11.4–15.2)

## 2021-01-21 LAB — HEMOGLOBIN AND HEMATOCRIT, BLOOD
HCT: 23.8 % — ABNORMAL LOW (ref 36.0–46.0)
Hemoglobin: 7.7 g/dL — ABNORMAL LOW (ref 12.0–15.0)

## 2021-01-21 LAB — GLUCOSE, CAPILLARY: Glucose-Capillary: 112 mg/dL — ABNORMAL HIGH (ref 70–99)

## 2021-01-21 MED ORDER — HEPARIN BOLUS VIA INFUSION
950.0000 [IU] | Freq: Once | INTRAVENOUS | Status: DC
  Filled 2021-01-21: qty 950

## 2021-01-21 MED ORDER — HEPARIN (PORCINE) 25000 UT/250ML-% IV SOLN
1350.0000 [IU]/h | INTRAVENOUS | Status: AC
  Administered 2021-01-22 – 2021-01-28 (×9): 1350 [IU]/h via INTRAVENOUS
  Filled 2021-01-21 (×10): qty 250

## 2021-01-21 MED ORDER — HEPARIN BOLUS VIA INFUSION
950.0000 [IU] | Freq: Once | INTRAVENOUS | Status: AC
  Administered 2021-01-21: 950 [IU] via INTRAVENOUS
  Filled 2021-01-21: qty 950

## 2021-01-21 NOTE — Progress Notes (Signed)
Occupational Therapy Treatment Patient Details Name: Ashley Camacho MRN: [id number] DOB: 24-Sep-1990 Today's Date: 01/21/2021    History of present illness 30 y.o. 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 Medical history significant of [redacted]w[redacted]d on presentation, Estimated Date of Delivery: 03/30/21 by by 9 week ultrasound however trying to make it to 34w for planned c-section, Type 1 diabetic, Cardiomyopathy, chronic systolic heart failure, DVT in bilateral arm. H/o recent hospitalizations d/t hyperemesis and sepsis secondary to pneumonia.   OT comments  Pt progressing towards acute OT goals. Pt much more alert and interactive then previous times this OT has worked with pt. Session today focused on establishing HEP for general UB strengthening using level 1 theraband. Noted that pt walked a couple of laps in the hall with PT earlier. Encouraged OOB and mobilizing throughout the day to build up activity tolerance. D/c plan remains appropriate.    Follow Up Recommendations  No OT follow up;Supervision/Assistance - 24 hour    Equipment Recommendations  None recommended by OT    Recommendations for Other Services      Precautions / Restrictions Precautions Precautions: Other (comment) Precaution Comments: swelling in LEs, pregnancy and CHF       Mobility Bed Mobility                    Transfers                      Balance                                           ADL either performed or assessed with clinical judgement   ADL Overall ADL's : Needs assistance/impaired                                       General ADL Comments: Generalized BUE weakness noted. Established theraband HEP. Confirmed with Attending MD pt ok for UB exercise program     Vision       Perception     Praxis      Cognition  Arousal/Alertness: Awake/alert Behavior During Therapy: WFL for tasks assessed/performed Overall Cognitive Status: Within Functional Limits for tasks assessed                                 General Comments: much more interactive and alert this session.        Exercises Exercises: Other exercises Other Exercises Other Exercises: level 1 theraband exercise for general UB strengthening Other Exercises: issued hand held therapy squeeze ball for working on grip strength Other Exercises: scapular adduction Other Exercises: cervical retraction   Shoulder Instructions       General Comments      Pertinent Vitals/ Pain       Pain Assessment: Faces Faces Pain Scale: Hurts little more Pain Location: generalized Pain Descriptors / Indicators: Discomfort Pain Intervention(s): Monitored during session  Home Living                                          Prior  Functioning/Environment              Frequency  Min 2X/week        Progress Toward Goals  OT Goals(current goals can now be found in the care plan section)  Progress towards OT goals: Progressing toward goals  Acute Rehab OT Goals Patient Stated Goal: to take a shower OT Goal Formulation: With patient Time For Goal Achievement: 01/29/21 Potential to Achieve Goals: Good ADL Goals Pt Will Perform Grooming: Independently;standing Pt Will Perform Lower Body Bathing: Independently;sit to/from stand Pt Will Perform Lower Body Dressing: Independently;sit to/from stand Pt Will Transfer to Toilet: Independently;ambulating;regular height toilet Pt Will Perform Toileting - Clothing Manipulation and hygiene: Independently;sit to/from stand Pt/caregiver will Perform Home Exercise Program: Increased ROM;Both right and left upper extremity;Independently  Plan Discharge plan remains appropriate    Co-evaluation                 AM-PAC OT "6 Clicks" Daily Activity     Outcome  Measure   Help from another person eating meals?: None Help from another person taking care of personal grooming?: A Little Help from another person toileting, which includes using toliet, bedpan, or urinal?: A Little Help from another person bathing (including washing, rinsing, drying)?: A Little Help from another person to put on and taking off regular upper body clothing?: None Help from another person to put on and taking off regular lower body clothing?: A Little 6 Click Score: 20    End of Session    OT Visit Diagnosis: Muscle weakness (generalized) (M62.81);Pain;Feeding difficulties (R63.3)   Activity Tolerance Patient tolerated treatment well   Patient Left in bed;with call bell/phone within reach   Nurse Communication          Time: 6160-7371 OT Time Calculation (min): 11 min  Charges: OT General Charges $OT Visit: 1 Visit OT Treatments $Therapeutic Exercise: 8-22 mins  Raynald Kemp, OT Acute Rehabilitation Services Pager: [phone] Office: [phone]    Pilar Grammes 01/21/2021, 2:13 PM

## 2021-01-21 NOTE — Progress Notes (Signed)
PROGRESS NOTE  Ashley Camacho  RJJ:[id number] DOB: Sep 14, 1990 DOA: 12/16/2020 PCP: Hildred Laser, MD   Brief Narrative: Ashley Camacho is a 30 y.o. G2P0101 with a history of T1DM, chronic HFrEF who was admitted initially 6/30 to [facility] for nausea and vomiting subsequently transferred to [facility] for rhinovirus pneumonia, discharged 7/20 only to return 7/22 with starvation ketosis and brachial vein DVT. TPN thru PICC was started and ultimately the patient was transferred to [facility] for cortrack placement on 7/26. Further complications detailed below:  7/27 cardiology consulted for cardiomyopathy, PICC line inserted, tube feeding started. 7/29 having recurrent retention, Foley catheter inserted 7/30 PRBC transfused 8/5 PRBC transfused 8/16 cardiology signed off, no follow-up recommended 8/19 nephrology consulted, no follow-up recommended 8/20 PRBC transfused 8/21 rescue course of betamethasone 8/22 Foley catheter reinserted after removal due to retention. 8/23 GI consulted. MFM consulted, nephrology signed off. 8/24 underwent EGD shows severe esophagitis with mild oozing.   Dobbhoff removed patient on oral diet. 8/25 PICC line removed. 8/26 started on steroids for adrenal insufficiency. 8/27 2 episodes of hematemesis.  Started on PPI drip.  GI reconsulted.  Lovenox held. 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 8/30 PICC line reinserted due to poor IV access, difficulty with blood draws and need for IV heparin.  Assessment & Plan: Active Problems:   Delivery with history of C-section   Anemia affecting pregnancy in second trimester   Acute kidney injury (HCC)   Hyperemesis   Pregnancy with 24 completed weeks gestation   DKA, type 1, not at goal [facility])   Starvation ketoacidosis   Acute deep vein thrombosis (DVT) of brachial vein of right upper extremity (HCC)   Acute respiratory failure with hypoxia (HCC)   Vitreous hemorrhage of right eye  (HCC)   Gastroparesis due to DM (HCC)   Diabetic retinopathy (HCC)   Acute on chronic combined systolic and diastolic CHF (congestive heart failure) (HCC)   Type 1 diabetes mellitus affecting pregnancy in second trimester, antepartum   HFrEF (heart failure with reduced ejection fraction) (HCC)   [redacted] weeks gestation of pregnancy   Acute respiratory distress   Anemia during pregnancy   Pre-existing type 1 diabetes mellitus during pregnancy in second trimester   Pregnancy with 28 completed weeks gestation   History of preterm delivery, currently pregnant   Leg edema   Nasogastric tube present   Acute esophagitis   Pregnancy with 29 completed weeks gestation  Hematemesis due to grade C esophagitis: Noted on EGD 8/24.  - Continue PPI IV, pepcid IV, and carafate.  - Monitor CBC as below. Switched lovenox to heparin.  - Trial soft diet. GI not planning repeat endoscopic evaluation at this time. ?if cortrack would be unadvisable with severe esophagitis.   Hyperemesis, intractable nausea and vomiting, gastroparesis:  - Continue scheduled reglan and prn zofran - Cortrack removed 8/24. If unable to tolerate adequate po intake, would need to consider reinsertion vs. TPN.   Acute blood loss anemia on anemia of pregnancy:  - Trend H/H with transfusion threshold of 7g/dl. Has received 5 total units PRBCs during this admission.   Adrenal insufficiency: ACTH stim test diagnostic.  - Continue hydrocortisone taper as ordered.  Right brachial vein DVT: Dx 7/17. Seems to be recannulating based on repeat U/S.  - Continue therapeutic anticoagulation with heparin. Plan to convert back to lovenox once bleeding durably resolved.   T1DM with gastroparesis and peripheral neuropathy: Brittle control due to sensitivity and inconsistent  calorie inputs. HbA1c was 5.8% 11/21/2020 (impacted by mid-late pregnancy).  - Continue insulin gtt and dextrose-containing IVF to prevent ketosis while po intake is minimal.    Acute on chronic HFrEF: Updated echo showed improved LVEF 45% now up to 50-55%. RV and pulmonary artery pressures wnl. Small pericardial effusion noted without tamponade.  - Continue to monitor - Appears to have dependent edema, but no pulmonary edema at this time.   Acute urinary retention: Right hydronephrosis is likely physiologic in this setting.  - Voiding trial.   AKI: Improved. - Continue monitoring. Pt has proteinuria and edema. Consider albumin-supported diuresis if worsening.   IUP: [redacted]w[redacted]d.  - MFM recommends rLTCS at 34 weeks (9/20). Continue NST weekly (qMonday). - Has received BMZ - OB comanaging, d/w Dr. Debroah Loop.  UTI: Treated with amoxicillin 8/16 - 8/22  Limited venous access:  - PICC reinserted 8/30.  DVT prophylaxis: Heparin gtt Code Status: Full Family Communication: None at bedside Disposition Plan:  Status is: Inpatient  Remains inpatient appropriate because:IV treatments appropriate due to intensity of illness or inability to take PO  Dispo: The patient is from: Home              Anticipated d/c is to: Home              Patient currently is not medically stable to d/c.  Consultants:  OB/GYN Nephrology Cardiology GI  Subjective: Feels tired, not getting OOB very often. Not hungry, and has full tray at bedside. No further hematemesis, still nauseated. On insulin since Dx at age 65.  Objective: BP (!) 150/62 (BP Location: Left Leg)   Pulse 85   Temp 98.3 F (36.8 C) (Oral)   Resp 18   Ht 5\' 1"  (1.549 m)   Wt 72 kg   LMP  (LMP Unknown)   SpO2 98%   BMI 29.99 kg/m   Gen: 30 y.o. female in no distress  Pulm: Non-labored breathing room air, crackles at bases that improved with repeated inspiratory efforts.  CV: Regular rate and rhythm. No murmur, rub, or gallop. No JVD, 1+ pitting pedal edema. GI: Abdomen soft, non-tender, non-distended, gravid fundus. +BS. Ext: Warm, no deformities Skin: No rashes, lesions or ulcers on visualized  skin. Neuro: Alert and oriented. No focal neurological deficits. Psych: Judgement and insight appear normal. Mood & affect appropriate.    LOS: 36 days   Time spent: 35 minutes.  37, MD [facility] [email or web address] 01/21/2021, 1:14 PM

## 2021-01-21 NOTE — Progress Notes (Signed)
ANTICOAGULATION CONSULT NOTE - Follow Up Consult  Pharmacy Consult for heparin Indication:  right brachial VTE   No Known Allergies  Patient Measurements: Height: 5\' 1"  (154.9 cm) Weight: 72 kg (158 lb 11.7 oz) IBW/kg (Calculated) : 47.8 Heparin Dosing Weight: 63.4 kg  Vital Signs: Temp: 98 F (36.7 C) (08/31 0831) Temp Source: Oral (08/31 0831) BP: 141/59 (08/31 0831) Pulse Rate: 82 (08/31 0831)  Labs: Recent Labs    01/20/21 0553 01/20/21 1745 01/21/21 0016 01/21/21 0516 01/21/21 0848  HGB 8.0*  --   --   --  7.7*  HCT 23.7*  --   --   --  23.8*  PLT 237  --   --   --   --   APTT  --  26  --  61*  --   LABPROT  --  13.7  --  14.1  --   INR  --  1.0  --  1.1  --   HEPARINUNFRC  --   --  0.20*  --  0.22*  CREATININE 1.00  --   --   --   --     Estimated Creatinine Clearance: 75.3 mL/min (by C-G formula based on SCr of 1 mg/dL).   Medications:  Infusions:   dextrose 5% lactated ringers 50 mL/hr at 01/21/21 0848   famotidine (PEPCID) IV 20 mg (01/21/21 1035)   heparin Stopped (01/21/21 1025)   insulin 1.5 Units/hr (01/21/21 1044)    Assessment: Anti-Xa level: 8/31 @ 09:26 = 0.22  Goal of Therapy:  Heparin level 0.3-0.7 units/ml Monitor platelets by anticoagulation protocol: Yes   Plan:  Give 950 units bolus x 1 Start heparin infusion at 1350 units/hr Check anti-Xa level in 6-8 hours and daily while on heparin Continue to monitor H&H and platelets  9/31 Devika Dragovich 01/21/2021,10:48 AM

## 2021-01-21 NOTE — Progress Notes (Signed)
ANTICOAGULATION CONSULT NOTE - Follow Up Consult  Pharmacy Consult for heparin Indication:  right brachial VTE   No Known Allergies  Patient Measurements: Height: 5\' 1"  (154.9 cm) Weight: 72 kg (158 lb 11.7 oz) IBW/kg (Calculated) : 47.8 Heparin Dosing Weight: 63.4 kg  Vital Signs: Temp: 98 F (36.7 C) (08/31 1651) Temp Source: Oral (08/31 1651) BP: 141/59 (08/31 1651) Pulse Rate: 90 (08/31 1651)  Labs: Recent Labs    01/20/21 0553 01/20/21 1745 01/21/21 0016 01/21/21 0516 01/21/21 0848 01/21/21 1811  HGB 8.0*  --   --   --  7.7*  --   HCT 23.7*  --   --   --  23.8*  --   PLT 237  --   --   --   --   --   APTT  --  26  --  61*  --   --   LABPROT  --  13.7  --  14.1  --   --   INR  --  1.0  --  1.1  --   --   HEPARINUNFRC  --   --  0.20*  --  0.22* 0.46  CREATININE 1.00  --   --   --   --   --      Estimated Creatinine Clearance: 75.3 mL/min (by C-G formula based on SCr of 1 mg/dL).   Medications:  Infusions:   dextrose 5% lactated ringers 50 mL/hr at 01/21/21 1729   famotidine (PEPCID) IV 20 mg (01/21/21 1035)   heparin 21 Units/kg/hr (01/21/21 1858)   insulin 0.7 Units/hr (01/21/21 1851)    Assessment: 29 YOF currently 30 weeks 2d pregnant, on IV heparin for R-brachial DVT. Heparin rate increased to 1350 units/hr (13.11ml/hr) this morning, 6 hr heparin level = 0.46, therapeutic. No bleeding noted per RN.   Goal of Therapy:  Heparin level 0.3-0.7 units/ml Monitor platelets by anticoagulation protocol: Yes   Plan:  Continue heparin infusion 1350 untis/hr F/u confirmatory heparin level and CBC at 0500  4m, PharmD, BCPS, BCPPS Clinical Pharmacist  Pager: [phone]

## 2021-01-21 NOTE — Progress Notes (Signed)
ANTICOAGULATION CONSULT NOTE - Follow Up Consult  Pharmacy Consult for Heparin drip Indication:  right brachial VTE  No Known Allergies  Patient Measurements: Height: 5\' 1"  (154.9 cm) Weight: 72 kg (158 lb 11.7 oz) IBW/kg (Calculated) : 47.8 Heparin Dosing Weight: 63.4 kg  Vital Signs: Temp: 98.2 F (36.8 C) (08/30 2331) Temp Source: Oral (08/30 2331) BP: 148/61 (08/30 2331) Pulse Rate: 87 (08/30 2331)  Labs: Recent Labs    01/18/21 0349 01/20/21 0553 01/20/21 1745 01/21/21 0016  HGB 8.1* 8.0*  --   --   HCT 25.8* 23.7*  --   --   PLT 219 237  --   --   APTT  --   --  26  --   LABPROT  --   --  13.7  --   INR  --   --  1.0  --   HEPARINUNFRC  --   --   --  0.20*  CREATININE  --  1.00  --   --     Estimated Creatinine Clearance: 75.3 mL/min (by C-G formula based on SCr of 1 mg/dL).   Assessment: Anti-xa level: 8/31 @ 0016 = 0.20 Goal of Therapy:  Heparin level 0.3-0.7 units/ml Monitor platelets by anticoagulation protocol: Yes   Plan:  Start heparin infusion at 1200 units/hr Check anti-Xa level in 6 hours and daily while on heparin Continue to monitor H&H and platelets  9/31 01/21/2021,1:54 AM

## 2021-01-21 NOTE — Progress Notes (Addendum)
Physical Therapy Treatment Patient Details Name: Ashley Camacho MRN: [id number] DOB: January 30, 1991 Today's Date: 01/21/2021    History of Present Illness 30 y.o. 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 Medical history significant of [redacted]w[redacted]d on presentation, Estimated Date of Delivery: 03/30/21 by by 9 week ultrasound however trying to make it to 34w for planned c-section, Type 1 diabetic, Cardiomyopathy, chronic systolic heart failure, DVT in bilateral arm. H/o recent hospitalizations d/t hyperemesis and sepsis secondary to pneumonia.    PT Comments    Pt was able to complete two laps around the unit today with 4 seated rest breaks.  Strong encouragement needed to progress gait distance further.  She continues to report R knee pain, however, no increased warmth or noticeably different swelling on her R knee compared to the left.  Ice applied at the end of session. PT will continue to follow acutely for safe mobility progression.  We will need to start to wean her from the walker vs plan to send her home on one, my preference is the later given her age.   Follow Up Recommendations  No PT follow up     Equipment Recommendations  None recommended by PT    Recommendations for Other Services       Precautions / Restrictions Restrictions Weight Bearing Restrictions: No    Mobility  Bed Mobility Overal bed mobility: Modified Independent                  Transfers Overall transfer level: Needs assistance Equipment used: 4-wheeled walker Transfers: Sit to/from Stand Sit to Stand: Supervision         General transfer comment: supervision for safety, line management.  Ambulation/Gait Ambulation/Gait assistance: Supervision Gait Distance (Feet): 75 Feet (x4) Assistive device: 4-wheeled walker Gait Pattern/deviations: Step-through pattern;Wide base of support Gait  velocity: decreased Gait velocity interpretation: 1.31 - 2.62 ft/sec, indicative of limited community ambulator General Gait Details: 4 seated rest breaks and two laps around the loop of the unit.  Pt needed strong encouragement to progress gait distance, reporting R knee pain, however, she was able to do it with two seated rest breaks each lap.  Bil knee high TEDS were on, but needed adjustment to avoid skin injury from rolling at bil prox lower legs and at midfoot.   Stairs             Wheelchair Mobility    Modified Rankin (Stroke Patients Only)       Balance                                            Cognition Arousal/Alertness: Awake/alert Behavior During Therapy: WFL for tasks assessed/performed Overall Cognitive Status: Within Functional Limits for tasks assessed                                        Exercises      General Comments General comments (skin integrity, edema, etc.): Pt reports distal thigh/knee pain, unsure of source other than generalized swelling.  Offered ice to the knee after gait and suggested a neoprene knee sleeve.  She was agreeable to ice.      Pertinent Vitals/Pain Pain Assessment: Faces Faces Pain Scale: Hurts even more  Pain Location: right distal thigh Pain Descriptors / Indicators: Discomfort Pain Intervention(s): Limited activity within patient's tolerance;Monitored during session;Repositioned;Ice applied    Home Living                      Prior Function            PT Goals (current goals can now be found in the care plan section) Progress towards PT goals: Progressing toward goals    Frequency    Min 3X/week      PT Plan Current plan remains appropriate    Co-evaluation              AM-PAC PT "6 Clicks" Mobility   Outcome Measure  Help needed turning from your back to your side while in a flat bed without using bedrails?: None Help needed moving from lying on  your back to sitting on the side of a flat bed without using bedrails?: None Help needed moving to and from a bed to a chair (including a wheelchair)?: None Help needed standing up from a chair using your arms (e.g., wheelchair or bedside chair)?: A Little Help needed to walk in hospital room?: A Little Help needed climbing 3-5 steps with a railing? : A Little 6 Click Score: 21    End of Session   Activity Tolerance: Patient limited by fatigue;Patient limited by pain Patient left: in bed (seated EOB, encouraged OOB for a few hours multiple times per day and walks with RN on days we are not here.)   PT Visit Diagnosis: Other abnormalities of gait and mobility (R26.89);Muscle weakness (generalized) (M62.81) Pain - Right/Left: Right Pain - part of body: Knee     Time: 8416-6063 PT Time Calculation (min) (ACUTE ONLY): 36 min  Charges:  $Gait Training: 23-37 mins                     Corinna Capra, PT, DPT  Acute Rehabilitation Ortho Tech Supervisor [phone] pager [phone]) [phone] office

## 2021-01-21 NOTE — Progress Notes (Signed)
Patient ID: Ashley Camacho, female   DOB: 01-17-1991, 30 y.o.   MRN: [id number] FACULTY PRACTICE ANTEPARTUM COMPREHENSIVE PROGRESS NOTE   Ashley Camacho is a 30 y.o. G2P0101 at [redacted]w[redacted]d  who is admitted for N/V, DM1 complications in pregnancy.  Estimated Date of Delivery: 03/30/21 Fetal presentation is cephalic.   Length of Stay:  36 Days. Admitted 12/16/2020   Subjective: Today she is feeling her baseline. She denies any additional N/V after 2nd episode of hematemesis. She denies CP/SOB/abdominal pain.    Patient reports good fetal movement.  She reports no uterine contractions, no bleeding and no loss of fluid per vagina.   Blood pressure (!) 141/59, pulse 82, temperature 98 F (36.7 C), temperature source Oral, resp. rate 19, height 5\' 1"  (1.549 m), weight 72 kg, SpO2 98 %.      Physical Examination: CONSTITUTIONAL: Well-developed, well-nourished female in no acute distress.  NEUROLOGIC: Alert and oriented to person, place, and time. No cranial nerve deficit noted. PSYCHIATRIC: Normal mood and affect. Normal behavior. Normal judgment and thought content. CARDIOVASCULAR: Normal heart rate noted, regular rhythm RESPIRATORY: Effort and breath sounds normal, no problems with respiration noted MUSCULOSKELETAL: Normal range of motion. No edema and no tenderness. 2+ distal pulses. ABDOMEN: Soft, nontender, nondistended, gravid. CERVIX: Dilation: 2 Effacement (%): 60 Exam by:: Dr. [email or web address]     Uterine activity: irregular contractions    Lab Results Last 48 Hours             Results for orders placed or performed during the hospital encounter of 12/16/20 (from the past 48 hour(s))  Low molecular wgt heparin (fractionated)     Status: None    Collection Time: 01/16/21 10:54 AM  Result Value Ref Range    Heparin LMW 0.53 IU/mL      Comment:        THERAPEUTIC RANGE: DVT,PE,ACS on LMWH 1 mg/kg q12 at 4 hrs = 0.5-1.2 units/mL. DVT,PE on LMWH 1.5 mg/kg q24 at 4 hrs = 1-2  units/mL. Performed at [facility], [address] [place], [place]    Basic metabolic panel     Status: Abnormal    Collection Time: 01/16/21 10:54 AM  Result Value Ref Range    Sodium 139 135 - 145 mmol/L    Potassium 4.4 3.5 - 5.1 mmol/L    Chloride 110 98 - 111 mmol/L    CO2 20 (L) 22 - 32 mmol/L    Glucose, Bld 127 (H) 70 - 99 mg/dL      Comment: Glucose reference range applies only to samples taken after fasting for at least 8 hours.    BUN 33 (H) 6 - 20 mg/dL    Creatinine, Ser 01/18/21 (H) 0.44 - 1.00 mg/dL    Calcium 8.4 (L) 8.9 - 10.3 mg/dL    GFR, Estimated 54 (L) >60 mL/min      Comment: (NOTE) Calculated using the CKD-EPI Creatinine Equation (2021)      Anion gap 9 5 - 15      Comment: Performed at [facility], [address], [place], [place]  Magnesium     Status: Abnormal    Collection Time: 01/16/21  3:11 PM  Result Value Ref Range    Magnesium 2.5 (H) 1.7 - 2.4 mg/dL      Comment: Performed at [facility], [address], [place], [place]  Basic metabolic panel     Status: Abnormal  Collection Time: 01/17/21  4:39 AM  Result Value Ref Range    Sodium 139 135 - 145 mmol/L    Potassium 4.5 3.5 - 5.1 mmol/L    Chloride 111 98 - 111 mmol/L    CO2 20 (L) 22 - 32 mmol/L    Glucose, Bld 104 (H) 70 - 99 mg/dL      Comment: Glucose reference range applies only to samples taken after fasting for at least 8 hours.    BUN 29 (H) 6 - 20 mg/dL    Creatinine, Ser 1.61 (H) 0.44 - 1.00 mg/dL    Calcium 8.4 (L) 8.9 - 10.3 mg/dL    GFR, Estimated 60 (L) >60 mL/min      Comment: (NOTE) Calculated using the CKD-EPI Creatinine Equation (2021)      Anion gap 8 5 - 15      Comment: Performed at [facility], [address], [place], [place] [address]  CBC     Status: Abnormal    Collection Time: 01/17/21  4:39 AM  Result Value Ref Range    WBC 6.6 4.0 - 10.5 K/uL    RBC 2.88 (L) 3.87 - 5.11 MIL/uL    Hemoglobin 8.7 (L)  12.0 - 15.0 g/dL    HCT 54.0 (L) 98.1 - 46.0 %    MCV 92.0 80.0 - 100.0 fL    MCH 30.2 26.0 - 34.0 pg    MCHC 32.8 30.0 - 36.0 g/dL    RDW 19.1 47.8 - 29.5 %    Platelets 239 150 - 400 K/uL    nRBC 0.0 0.0 - 0.2 %      Comment: Performed at [facility], [address] [place], [place] [address]  Type and screen [facility]     Status: None    Collection Time: 01/17/21  4:39 AM  Result Value Ref Range    ABO/RH(D) A POS      Antibody Screen NEG      Sample Expiration          01/20/2021,2359 Performed at [facility], [address] [place], [place] [address]    Glucose, capillary     Status: Abnormal    Collection Time: 01/17/21  6:08 AM  Result Value Ref Range    Glucose-Capillary 116 (H) 70 - 99 mg/dL      Comment: Glucose reference range applies only to samples taken after fasting for at least 8 hours.  CBC     Status: Abnormal    Collection Time: 01/17/21  5:43 PM  Result Value Ref Range    WBC 8.7 4.0 - 10.5 K/uL    RBC 2.70 (L) 3.87 - 5.11 MIL/uL    Hemoglobin 8.3 (L) 12.0 - 15.0 g/dL    HCT 46.9 (L) 62.9 - 46.0 %    MCV 90.7 80.0 - 100.0 fL    MCH 30.7 26.0 - 34.0 pg    MCHC 33.9 30.0 - 36.0 g/dL    RDW 52.8 41.3 - 24.4 %    Platelets 248 150 - 400 K/uL    nRBC 0.2 0.0 - 0.2 %      Comment: Performed at [facility], [address] [place], [place] [address]  CBC     Status: Abnormal    Collection Time: 01/18/21  3:49 AM  Result Value Ref Range    WBC 8.7 4.0 - 10.5 K/uL    RBC 2.70 (L) 3.87 -  5.11 MIL/uL    Hemoglobin 8.1 (L) 12.0 - 15.0 g/dL    HCT 62.2 (L) 63.3 - 46.0 %    MCV 95.6 80.0 - 100.0 fL    MCH 30.0 26.0 - 34.0 pg    MCHC 31.4 30.0 - 36.0 g/dL    RDW 35.4 (H) 56.2 - 15.5 %    Platelets 219 150 - 400 K/uL    nRBC 0.0 0.0 - 0.2 %      Comment: Performed at [facility], [address] [place], [place] [address]  Glucose, capillary     Status: None    Collection Time: 01/18/21  5:39 AM  Result Value  Ref Range    Glucose-Capillary 87 70 - 99 mg/dL      Comment: Glucose reference range applies only to samples taken after fasting for at least 8 hours.     CBG (last 3)  Recent Labs    01/19/21 0653 01/20/21 0605 01/21/21 0540  GLUCAP 103* 107* 112*          Imaging Results (Last 48 hours)  No results found.      Current scheduled medications  cholecalciferol  1,000 Units Oral Daily   vitamin B-6  12.5 mg Oral BID    And   doxylamine (Sleep)  12.5 mg Oral BID   DULoxetine  20 mg Oral Daily   feeding supplement (GLUCERNA SHAKE)  237 mL Oral BID BM   hydrALAZINE  25 mg Oral Q8H   hydrocortisone sod succinate (SOLU-CORTEF) inj  50 mg Intravenous Q12H   mouth rinse  15 mL Mouth Rinse q12n4p   metoCLOPramide (REGLAN) injection  5 mg Intravenous Q6H   [START ON 01/21/2021] pantoprazole  40 mg Intravenous Q12H   prenatal vitamin w/FE, FA  1 tablet Oral Q1200   sodium chloride  1 spray Each Nare Q2H   sucralfate  1 g Oral TID WC & HS   vitamin B-12  1,000 mcg Oral Daily      I have reviewed the patient's current medications.   ASSESSMENT: Delivery with history of C-section   Acute kidney injury (HCC)   Hyperemesis   DKA, type 1, not at goal [facility])   Acute deep vein thrombosis (DVT) of brachial vein of right upper extremity (HCC)   Acute on chronic combined systolic and diastolic CHF (congestive heart failure) (HCC)   History of preterm delivery, currently pregnant   PLAN: 1) OB care   Fetal Heart Rate A    Mode External filed at 01/20/2021 1218  Baseline Rate (A) 145 bpm filed at 01/20/2021 1218  Variability 6-25 BPM filed at 01/20/2021 1218  Accelerations 10 x 10 filed at 01/20/2021 1218  Decelerations Variable filed at 01/20/2021 1218                BPP weekly, last completed 8/24             Growth q 4 wks, last completed 8/22             S/p BMZ 8/23-24 -MFM following- see their prior note for details   2) GI Nausea/vomiting/esophagitis              Tolerating carb modified diet, Glucerna             Plan to slowly transition IV anti-emetics to oral             Continue pepcid, protonix, carafate  S/p EGD with GI on 8/24             PPI drip and clear liquids for diet d/t hematemesis Diarrhea- improved, C. Diff neg Malnutrition             Dietician on board             B12 replacement per IM             On daily vitamin  She may wish to advance her diet    3) ENDO - Type 1 DM             On endo tool- patient has fluctuating needs and may still need IV dosing but will see if pump can be started soon. Discussed with Dr. Jarvis Newcomer and diabetes coordinator               - Adrenal insufficiency             Diagnosed on 8/26 and started on steriods   4) Neph/GU -UOP remains adequate -  5) Heme -Anemia             Work up per IM Fecal occult positive- GI aware, in the s/o pregnancy, will hold off on colonoscopy at this time.  If Hgb worsens and assessment/intervention is warranted may reconsider  HgB stable this morning             -h/o right brachial VTE             Lovenox 55u bid - currently on hold due to recurrent hematemesis. IM team reviewed with heme             level followed by pharmacy   6) Cardio -HTN- currently on Hydralazine 25mg  q8hr - BP returned to baseline with administration of steroids   -s/p cardiology consult CHF stable   7) Maternal care  -encourage ambulation, PT/OT on board, advancing diet encouraged   Plan as outlined above, continue with supportive care with co-management per IM.  Goal for delivery @ 34wk via Repeat C-section and bilateral tubal sterilization if maternal/fetal well being remains stable. Medicaid tubal papers signed and on her paper chart.      Continue routine antenatal care.   Adam Phenix, MD 01/21/2021 10:37 AM

## 2021-01-22 LAB — CBC
HCT: 22.4 % — ABNORMAL LOW (ref 36.0–46.0)
Hemoglobin: 7.2 g/dL — ABNORMAL LOW (ref 12.0–15.0)
MCH: 30.5 pg (ref 26.0–34.0)
MCHC: 32.1 g/dL (ref 30.0–36.0)
MCV: 94.9 fL (ref 80.0–100.0)
Platelets: 202 K/uL (ref 150–400)
RBC: 2.36 MIL/uL — ABNORMAL LOW (ref 3.87–5.11)
RDW: 15.9 % — ABNORMAL HIGH (ref 11.5–15.5)
WBC: 9.9 K/uL (ref 4.0–10.5)
nRBC: 0 % (ref 0.0–0.2)

## 2021-01-22 LAB — GLUCOSE, CAPILLARY
Glucose-Capillary: 162 mg/dL — ABNORMAL HIGH (ref 70–99)
Glucose-Capillary: 70 mg/dL (ref 70–99)

## 2021-01-22 LAB — HEPARIN LEVEL (UNFRACTIONATED): Heparin Unfractionated: 0.43 IU/mL (ref 0.30–0.70)

## 2021-01-22 MED ORDER — ALBUMIN HUMAN 25 % IV SOLN
12.5000 g | Freq: Once | INTRAVENOUS | Status: AC
  Administered 2021-01-22: 12.5 g via INTRAVENOUS
  Filled 2021-01-22: qty 50

## 2021-01-22 MED ORDER — FUROSEMIDE 10 MG/ML IJ SOLN
40.0000 mg | Freq: Once | INTRAMUSCULAR | Status: AC
  Administered 2021-01-22: 40 mg via INTRAVENOUS
  Filled 2021-01-22: qty 4

## 2021-01-22 MED ORDER — FAMOTIDINE 20 MG PO TABS
20.0000 mg | ORAL_TABLET | Freq: Two times a day (BID) | ORAL | Status: DC
  Administered 2021-01-23 – 2021-03-04 (×79): 20 mg via ORAL
  Filled 2021-01-22 (×82): qty 1

## 2021-01-22 NOTE — Progress Notes (Signed)
PROGRESS NOTE  Ashley Camacho  ZHG:[id number] DOB: 05-Jun-1990 DOA: 12/16/2020 PCP: Hildred Laser, MD   Brief Narrative: Ashley Camacho is a 30 y.o. G2P0101 with a history of T1DM, chronic HFrEF who was admitted initially 6/30 to [facility] for nausea and vomiting subsequently transferred to [facility] for rhinovirus pneumonia, discharged 7/20 only to return 7/22 with starvation ketosis and brachial vein DVT. TPN thru PICC was started and ultimately the patient was transferred to [facility] for cortrack placement on 7/26. Further complications detailed below:  7/27 cardiology consulted for cardiomyopathy, PICC line inserted, tube feeding started. 7/29 having recurrent retention, Foley catheter inserted 7/30 PRBC transfused 8/5 PRBC transfused 8/16 cardiology signed off, no follow-up recommended 8/19 nephrology consulted, no follow-up recommended 8/20 PRBC transfused 8/21 rescue course of betamethasone 8/22 Foley catheter reinserted after removal due to retention. 8/23 GI consulted. MFM consulted, nephrology signed off. 8/24 underwent EGD shows severe esophagitis with mild oozing.   Dobbhoff removed patient on oral diet. 8/25 PICC line removed. 8/26 started on steroids for adrenal insufficiency. 8/27 2 episodes of hematemesis.  Started on PPI drip.  GI reconsulted.  Lovenox held. 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 8/30 PICC line reinserted due to poor IV access, difficulty with blood draws and need for IV heparin.  Assessment & Plan: Active Problems:   Delivery with history of C-section   Anemia affecting pregnancy in second trimester   Acute kidney injury (HCC)   Hyperemesis   Pregnancy with 24 completed weeks gestation   DKA, type 1, not at goal Ashley Camacho)   Starvation ketoacidosis   Acute deep vein thrombosis (DVT) of brachial vein of right upper extremity (HCC)   Acute respiratory failure with hypoxia (HCC)   Vitreous hemorrhage of right eye  (HCC)   Gastroparesis due to DM (HCC)   Diabetic retinopathy (HCC)   Acute on chronic combined systolic and diastolic CHF (congestive heart failure) (HCC)   Type 1 diabetes mellitus affecting pregnancy in second trimester, antepartum   HFrEF (heart failure with reduced ejection fraction) (HCC)   [redacted] weeks gestation of pregnancy   Acute respiratory distress   Anemia during pregnancy   Pre-existing type 1 diabetes mellitus during pregnancy in second trimester   Pregnancy with 28 completed weeks gestation   History of preterm delivery, currently pregnant   Leg edema   Nasogastric tube present   Acute esophagitis   Pregnancy with 29 completed weeks gestation  Hematemesis due to grade C esophagitis: Noted on EGD 8/24.  - Continue PPI IV, pepcid IV, and carafate.  - Monitor CBC as below.  - Continue soft/dysphagia diet as tolerated. GI not planning repeat endoscopic evaluation at this time. ?if cortrack would be unadvisable with severe esophagitis.   Hyperemesis, intractable nausea and vomiting, gastroparesis:  - Continue scheduled reglan and prn zofran - Cortrack removed 8/24. If unable to tolerate adequate po intake, would need to consider reinsertion vs. TPN. Will initiate formal calorie count.  Acute blood loss anemia on anemia of pregnancy:  - Trend H/H with transfusion threshold of 7g/dl. Slight downtrend to 7.2g/dl on 9/1 in absence of gross bleeding. Has received 5 total units PRBCs during this admission.   Adrenal insufficiency: ACTH stim test diagnostic.  - Continue hydrocortisone taper as ordered. Plan eventual dosing 20mg  qAM, 10mg  qPM.  Right brachial vein DVT: Dx 7/17. Seems to be recannulating based on repeat U/S.  - Continue therapeutic anticoagulation with heparin. Plan to convert back to  lovenox if no further hematemesis, and stable hgb on 9/2.  T1DM with gastroparesis and peripheral neuropathy: Brittle control due to sensitivity and inconsistent calorie inputs. HbA1c  was 5.8% 11/21/2020 (impacted by mid-late pregnancy).  - Continue insulin gtt and dextrose-containing IVF to prevent ketosis while po intake is minimal. Hope to transition back to insulin pump soon.  Acute on chronic HFrEF: Updated echo showed improved LVEF 45% now up to 50-55%. RV and pulmonary artery pressures wnl. Small pericardial effusion noted without tamponade.  - Appears to have dependent edema, but no pulmonary edema at this time. Suspect malnutrition/hypoalbuminemia is driver of edema. Will initiate albumin + lasix to shift volume intravascular and diurese. I/O reviewed since admission 7/26 is net positive at least 10L and weight up ~3kg (though this can be expected in this setting).   Acute urinary retention: Right hydronephrosis is likely physiologic in this setting.  - Voiding trial.   AKI: Improved. - Continue monitoring w/albumin + lasix today. Pt has proteinuria and edema.    IUP: [redacted]w[redacted]d.  - MFM recommends rLTCS at 34 weeks (9/20). Continue NST weekly (qMonday). - Has received BMZ - OB comanaging   UTI: Treated with amoxicillin 8/16 - 8/22  Limited venous access:  - PICC reinserted 8/30.  DVT prophylaxis: Heparin gtt Code Status: Full Family Communication: None at bedside Disposition Plan:  Status is: Inpatient  Remains inpatient appropriate because:IV treatments appropriate due to intensity of illness or inability to take PO  Dispo: The patient is from: Home              Anticipated d/c is to: Home              Patient currently is not medically stable to d/c.  Consultants:  OB/GYN Nephrology Cardiology GI  Subjective: Didn't sleep well last night due to general discomfort, interruptions. Says she ate some dinner. No breakfast yet. Ok w/getting albumin/blood products if needed. No vomiting over past 24 hours. No bleeding.   Objective: BP (!) 143/64 (BP Location: Right Leg)   Pulse 89   Temp 98.5 F (36.9 C) (Oral)   Resp 16   Ht 5\' 1"  (1.549 m)   Wt 72  kg   LMP  (LMP Unknown)   SpO2 99%   BMI 29.99 kg/m   Gen: Somewhat chronically ill-appearing female in no distress Pulm: Nonlabored breathing room air. Clear. CV: Regular rate and rhythm. No murmur, rub, or gallop. No definite JVD, 2-3+ pitting dependent edema. GI: Abdomen soft, gravid, non-tender, non-distended, with normoactive bowel sounds.  Ext: Warm, no deformities Skin: No new rashes, lesions or ulcers on visualized skin. Diffuse pallor is stable. Neuro: Alert and oriented. No focal neurological deficits. Psych: Judgement and insight appear fair. Mood euthymic & affect congruent. Behavior is appropriate.     LOS: 37 days   Time spent: 35 minutes.  , MD [facility] [email or web address] 01/22/2021, 9:15 AM

## 2021-01-22 NOTE — Progress Notes (Signed)
Nutrition: Calorie count  Document percent consumed for each individual item on the patient's meal tray In Epic comprehensive flowsheet as note under PO intake. Also document percent of any supplement or snack pt consumes .  RD will  review and calculate intake remotely   Example: Breakfast 9/1 50% banana 50% rice krispies 100% fat free milk

## 2021-01-22 NOTE — Progress Notes (Signed)
Patient ID: Ashley Camacho, female   DOB: 1991-01-10, 30 y.o.   MRN: [id number] FACULTY PRACTICE ANTEPARTUM(COMPREHENSIVE) NOTE  Ashley Camacho is a 30 y.o. G2P0101 with Estimated Date of Delivery: 03/30/21   By   [redacted]w[redacted]d  who is admitted for diabetes management, pre eclampsia.    Fetal presentation is unsure. Length of Stay:  37  Days  Date of admission:12/16/2020  Subjective: Pt states she is feeling better Patient reports the fetal movement as active. Patient reports uterine contraction  activity as none. Patient reports  vaginal bleeding as none. Patient describes fluid per vagina as None.  Vitals:  Blood pressure (!) 159/82, pulse 85, temperature 98.3 F (36.8 C), temperature source Oral, resp. rate 17, height 5\' 1"  (1.549 m), weight 72 kg, SpO2 98 %. Vitals:   01/22/21 0003 01/22/21 0420 01/22/21 0726 01/22/21 1128  BP: (!) 153/59 (!) 151/59 (!) 143/64 (!) 159/82  Pulse: 83 84 89 85  Resp: 18 16 16 17   Temp: 98.9 F (37.2 C) 98.6 F (37 C) 98.5 F (36.9 C) 98.3 F (36.8 C)  TempSrc: Oral Oral Oral Oral  SpO2: 99% 98% 99% 98%  Weight:      Height:       Physical Examination:  General appearance - alert, well appearing, and in no distress Abdomen - soft, nontender, nondistended, no masses or organomegaly Fundal Height:  size equals dates Pelvic Exam:  examination not indicated Cervical Exam: Not evaluated. Extremities: extremities normal, atraumatic, no cyanosis or edema with DTRs 2+ bilaterally Membranes:intact  Fetal Monitoring:  Baseline: 140s bpm, Variability: Good {> 6 bpm), Accelerations: Reactive, and Decelerations: Absent   reactive  Labs:  Results for orders placed or performed during the hospital encounter of 12/16/20 (from the past 24 hour(s))  Heparin level (unfractionated)   Collection Time: 01/21/21  6:11 PM  Result Value Ref Range   Heparin Unfractionated 0.46 0.30 - 0.70 IU/mL  Glucose, capillary   Collection Time: 01/22/21  5:11 AM  Result Value  Ref Range   Glucose-Capillary 70 70 - 99 mg/dL  Heparin level (unfractionated)   Collection Time: 01/22/21  5:33 AM  Result Value Ref Range   Heparin Unfractionated 0.43 0.30 - 0.70 IU/mL  CBC   Collection Time: 01/22/21  5:33 AM  Result Value Ref Range   WBC 9.9 4.0 - 10.5 K/uL   RBC 2.36 (L) 3.87 - 5.11 MIL/uL   Hemoglobin 7.2 (L) 12.0 - 15.0 g/dL   HCT 03/24/21 (L) 03/24/21 - 61.4 %   MCV 94.9 80.0 - 100.0 fL   MCH 30.5 26.0 - 34.0 pg   MCHC 32.1 30.0 - 36.0 g/dL   RDW 43.1 (H) 54.0 - 08.6 %   Platelets 202 150 - 400 K/uL   nRBC 0.0 0.0 - 0.2 %    Imaging Studies:    VAS 76.1 UPPER EXTREMITY VENOUS DUPLEX  Result Date: 01/19/2021 UPPER VENOUS STUDY  Patient Name:  Ashley Camacho  Date of Exam:   01/18/2021 Medical Rec #: Ashley Camacho        Accession #:    01/20/2021 Date of Birth: [birth year]       Patient Gender: F Patient Age:   30 years Exam Location:  [facility] Procedure:      VAS 37 UPPER EXTREMITY VENOUS DUPLEX Referring Phys: PRANAV PATEL --------------------------------------------------------------------------------  Indications: F/U right upper extremity DVT of one of the paired brachial veins and SVT of the right cephalic vein. Risk Factors: DVT DVT found  in right brachial vein at [facility] 12/07/20. Anticoagulation: Lovenox. Limitations: Limited cooperation from patient. Comparison Study: Prior right upper extremity venous duplex done 12/20/20 Performing Technologist: Sherren Kerns RVS  Examination Guidelines: A complete evaluation includes B-mode imaging, spectral Doppler, color Doppler, and power Doppler as needed of all accessible portions of each vessel. Bilateral testing is considered an integral part of a complete examination. Limited examinations for reoccurring indications may be performed as noted.  Right Findings: +----------+------------+---------+-----------+-----------------+--------------+ RIGHT     CompressiblePhasicitySpontaneous   Properties       Summary      +----------+------------+---------+-----------+-----------------+--------------+ IJV           Full       Yes       Yes                                    +----------+------------+---------+-----------+-----------------+--------------+ Subclavian               Yes       Yes                                    +----------+------------+---------+-----------+-----------------+--------------+ Axillary                 Yes       Yes                                    +----------+------------+---------+-----------+-----------------+--------------+ Brachial    Partial                           partially                                                               re-cannulated                 +----------+------------+---------+-----------+-----------------+--------------+ Radial        Full                                                        +----------+------------+---------+-----------+-----------------+--------------+ Ulnar                                                      Not visualized +----------+------------+---------+-----------+-----------------+--------------+ Cephalic    Partial                                                       +----------+------------+---------+-----------+-----------------+--------------+ Basilic       Full                                                        +----------+------------+---------+-----------+-----------------+--------------+  Left Findings: +----------+------------+---------+-----------+----------+-------+ LEFT      CompressiblePhasicitySpontaneousPropertiesSummary +----------+------------+---------+-----------+----------+-------+ Subclavian               Yes       Yes                      +----------+------------+---------+-----------+----------+-------+  Summary:  Right: 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 Findings consistent with chronic  superficial vein thrombosis involving the right cephalic vein. There is partial recanalization of the DVT found in one of the paired right brachial veins. Chronic SVT noted in a small portion of the cephalic vein is still present.  Left: No evidence of thrombosis in the subclavian.  *See table(s) above for measurements and observations.  Diagnosing physician: Lemar Livings MD Electronically signed by Lemar Livings MD on 01/19/2021 at 5:30:47 PM.    Final    [place] EKG SITE RITE  Result Date: 01/20/2021 If Site Rite image not attached, placement could not be confirmed due to current cardiac rhythm.    Medications:  Scheduled  Chlorhexidine Gluconate Cloth  6 each Topical Daily   cholecalciferol  1,000 Units Oral Daily   vitamin B-6  12.5 mg Oral BID   And   doxylamine (Sleep)  12.5 mg Oral BID   DULoxetine  20 mg Oral Daily   feeding supplement (GLUCERNA SHAKE)  237 mL Oral BID BM   hydrALAZINE  25 mg Oral Q8H   [START ON 01/23/2021] hydrocortisone sod succinate (SOLU-CORTEF) inj  60 mg Intravenous Daily   Followed by   Melene Muller ON 01/25/2021] hydrocortisone sod succinate (SOLU-CORTEF) inj  50 mg Intravenous Daily   Followed by   Melene Muller ON 01/27/2021] hydrocortisone sod succinate (SOLU-CORTEF) inj  40 mg Intravenous Daily   mouth rinse  15 mL Mouth Rinse q12n4p   metoCLOPramide (REGLAN) injection  5 mg Intravenous Q6H   pantoprazole  40 mg Intravenous Q12H   prenatal vitamin w/FE, FA  1 tablet Oral Q1200   sodium chloride  1 spray Each Nare Q2H   sodium chloride flush  10-40 mL Intracatheter Q12H   sucralfate  1 g Oral TID WC & HS   vitamin B-12  1,000 mcg Oral Daily   I have reviewed the patient's current medications.  ASSESSMENT: G2P0101 [redacted]w[redacted]d Estimated Date of Delivery: 03/30/21  Patient Active Problem List   Diagnosis Date Noted   Pregnancy with 29 completed weeks gestation    Acute esophagitis    Nasogastric tube present    Leg edema    Pregnancy with 28 completed weeks gestation     History of preterm delivery, currently pregnant    Pre-existing type 1 diabetes mellitus during pregnancy in second trimester    Acute respiratory distress    Anemia during pregnancy    [redacted] weeks gestation of pregnancy    Type 1 diabetes mellitus affecting pregnancy in second trimester, antepartum    HFrEF (heart failure with reduced ejection fraction) (HCC)    Acute deep vein thrombosis (DVT) of brachial vein of right upper extremity (HCC) 12/17/2020   Acute respiratory failure with hypoxia (HCC) 12/17/2020   Anxiety 12/17/2020   Diabetic retinopathy (HCC) 12/17/2020   Acute on chronic combined systolic and diastolic CHF (congestive heart failure) (HCC)    Starvation ketoacidosis 12/16/2020   Hyperemesis 12/13/2020   Pregnancy with 24 completed weeks gestation 12/13/2020   DKA, type 1, not at goal [facility]) 12/13/2020   Sunburn 12/13/2020   Hypothyroidism 12/10/2020  Malnutrition (HCC) 12/10/2020   Vitreous hemorrhage of right eye (HCC) 12/10/2020   Pleural effusion associated with pulmonary infection 11/25/2020   Pneumonia affecting pregnancy 11/24/2020   Diabetes mellitus affecting pregnancy, second trimester 11/24/2020   Edema 11/24/2020   Decreased urine output 11/24/2020   Shortness of breath    Zinc deficiency    SOB (shortness of breath)    Social problem 11/21/2020   Nausea and vomiting during pregnancy prior to [redacted] weeks gestation 11/21/2020   Low serum vitamin B12    Delivery with history of C-section 11/20/2020   Anemia affecting pregnancy in second trimester    Absolute anemia    Acute kidney injury (HCC)    Nausea and vomiting during pregnancy 10/23/2020   History of premature delivery, currently pregnant, second trimester 10/13/2020   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

## 2021-01-22 NOTE — Progress Notes (Signed)
Patient ID: Ashley Camacho, female   DOB: Sep 20, 1990, 30 y.o.   MRN: [id number] FACULTY PRACTICE ANTEPARTUM COMPREHENSIVE PROGRESS NOTE   Ashley Camacho is a 30 y.o. G2P0101 at [redacted]w[redacted]d  who is admitted for N/V, DM1 complications in pregnancy.  Estimated Date of Delivery: 03/30/21 Fetal presentation is cephalic.   Length of Stay:  37 Days. Admitted 12/16/2020   Subjective: Today she is feeling her baseline. She denies any additional N/V after 2nd episode of hematemesis. She denies CP/SOB/abdominal pain.    Patient reports good fetal movement.  She reports no uterine contractions, no bleeding and no loss of fluid per vagina.   Blood pressure (!) 143/64, pulse 89, temperature 98.5 F (36.9 C), temperature source Oral, resp. rate 16, height 5\' 1"  (1.549 m), weight 72 kg, SpO2 99 %.     Physical Examination: CONSTITUTIONAL: Well-developed, well-nourished female in no acute distress.  NEUROLOGIC: Alert and oriented to person, place, and time. No cranial nerve deficit noted. PSYCHIATRIC: Normal mood and affect. Normal behavior. Normal judgment and thought content. CARDIOVASCULAR: Normal heart rate noted, regular rhythm RESPIRATORY: Effort and breath sounds normal, no problems with respiration noted MUSCULOSKELETAL: Normal range of motion. No edema and no tenderness. 2+ distal pulses. ABDOMEN: Soft, nontender, nondistended, gravid. CERVIX: Dilation: 2 Effacement (%): 60 Exam by:: Dr. [email or web address]     Uterine activity: irregular contractions    CMP Latest Ref Rng & Units 01/20/2021 01/17/2021 01/16/2021  Glucose 70 - 99 mg/dL 01/18/2021) 329(J) 242(A)  BUN 6 - 20 mg/dL 834(H) 96(Q) 22(L)  Creatinine 0.44 - 1.00 mg/dL 79(G 9.21) 1.94(R)  Sodium 135 - 145 mmol/L 137 139 139  Potassium 3.5 - 5.1 mmol/L 4.1 4.5 4.4  Chloride 98 - 111 mmol/L 110 111 110  CO2 22 - 32 mmol/L 22 20(L) 20(L)  Calcium 8.9 - 10.3 mg/dL 8.3(L) 8.4(L) 8.4(L)  Total Protein 6.5 - 8.1 g/dL 4.6(L) - -  Total Bilirubin 0.3 - 1.2  mg/dL 0.9 - -  Alkaline Phos 38 - 126 U/L 89 - -  AST 15 - 41 U/L 26 - -  ALT 0 - 44 U/L 28 - -     CBG (last 3)  Recent Labs    01/20/21 0605 01/21/21 0540 01/22/21 0511  GLUCAP 107* 112* 70     CBC    Component Value Date/Time   WBC 9.9 01/22/2021 0533   RBC 2.36 (L) 01/22/2021 0533   HGB 7.2 (L) 01/22/2021 0533   HGB 12.2 03/01/2013 1844   HCT 22.4 (L) 01/22/2021 0533   HCT 35.3 03/01/2013 1844   PLT 202 01/22/2021 0533   PLT 199 03/01/2013 1844   MCV 94.9 01/22/2021 0533   MCV 84 03/01/2013 1844   MCH 30.5 01/22/2021 0533   MCHC 32.1 01/22/2021 0533   RDW 15.9 (H) 01/22/2021 0533   RDW 13.0 03/01/2013 1844   LYMPHSABS 0.9 01/15/2021 0453   MONOABS 0.5 01/15/2021 0453   EOSABS 0.0 01/15/2021 0453   BASOSABS 0.0 01/15/2021 0453            Imaging Results (Last 48 hours)  No results found.      Current scheduled medications  cholecalciferol  1,000 Units Oral Daily   vitamin B-6  12.5 mg Oral BID    And   doxylamine (Sleep)  12.5 mg Oral BID   DULoxetine  20 mg Oral Daily   feeding supplement (GLUCERNA SHAKE)  237 mL Oral BID BM   hydrALAZINE  25 mg Oral Q8H  hydrocortisone sod succinate (SOLU-CORTEF) inj  50 mg Intravenous Q12H   mouth rinse  15 mL Mouth Rinse q12n4p   metoCLOPramide (REGLAN) injection  5 mg Intravenous Q6H   [START ON 01/21/2021] pantoprazole  40 mg Intravenous Q12H   prenatal vitamin w/FE, FA  1 tablet Oral Q1200   sodium chloride  1 spray Each Nare Q2H   sucralfate  1 g Oral TID WC & HS   vitamin B-12  1,000 mcg Oral Daily      I have reviewed the patient's current medications.   ASSESSMENT: Delivery with history of C-section   Acute kidney injury (HCC)   Hyperemesis   DKA, type 1, not at goal [facility])   Acute deep vein thrombosis (DVT) of brachial vein of right upper extremity (HCC)   Acute on chronic combined systolic and diastolic CHF (congestive heart failure) (HCC)   History of preterm delivery, currently pregnant    PLAN: 1) OB care   Fetal Heart Rate A    Mode External filed at 01/20/2021 1218  Baseline Rate (A) 145 bpm filed at 01/20/2021 1218  Variability 6-25 BPM filed at 01/20/2021 1218  Accelerations 10 x 10 filed at 01/20/2021 1218  Decelerations Variable filed at 01/20/2021 1218                BPP weekly, last completed 8/24             Growth q 4 wks, last completed 8/22             S/p BMZ 8/23-24 -MFM following- see their prior note for details   2) GI Nausea/vomiting/esophagitis             Tolerating carb modified diet, Glucerna             Plan to slowly transition IV anti-emetics to oral             Continue pepcid, protonix, carafate             S/p EGD with GI on 8/24             PPI drip and clear liquids for diet d/t hematemesis Diarrhea- improved, C. Diff neg Malnutrition             Dietician on board             B12 replacement per IM             On daily vitamin  She may wish to advance her diet    3) ENDO - Type 1 DM             On endo tool- patient has fluctuating needs and may still need IV dosing but will see if pump can be started soon. Discussed with Dr. Jarvis Newcomer and diabetes coordinator               - Adrenal insufficiency             Diagnosed on 8/26 and started on steriods   4) Neph/GU -UOP remains adequate -  5) Heme -Anemia             Work up per IM Fecal occult positive- GI aware, in the s/o pregnancy, will hold off on colonoscopy at this time.  If Hgb worsens and assessment/intervention is warranted may reconsider  HgB stable this morning             -h/o right brachial VTE  Lovenox 55u bid - currently on hold due to recurrent hematemesis. IM team reviewed with heme             level followed by pharmacy   6) Cardio -HTN- currently on Hydralazine 25mg  q8hr - BP returned to baseline with administration of steroids   -s/p cardiology consult CHF stable   7) Maternal care   -encourage ambulation, PT/OT on board, advancing  diet encouraged   Plan as outlined above, continue with supportive care with co-management per IM.  Goal for delivery @ 34wk via Repeat C-section and bilateral tubal sterilization if maternal/fetal well being remains stable. Medicaid tubal papers signed and on her paper chart.      Continue routine antenatal care.  , MD 01/22/2021 11:08 AM

## 2021-01-22 NOTE — Progress Notes (Signed)
ANTICOAGULATION CONSULT NOTE - Follow Up Consult  Pharmacy Consult for heparin Indication:  right brachial VTE  No Known Allergies  Patient Measurements: Height: 5\' 1"  (154.9 cm) Weight: 72 kg (158 lb 11.7 oz) IBW/kg (Calculated) : 47.8 Heparin Dosing Weight: 63.4 kg  Vital Signs: Temp: 98.5 F (36.9 C) (09/01 0726) Temp Source: Oral (09/01 0726) BP: 143/64 (09/01 0726) Pulse Rate: 89 (09/01 0726)  Labs: Recent Labs    01/20/21 0553 01/20/21 1745 01/21/21 0016 01/21/21 0516 01/21/21 0848 01/21/21 1811 01/22/21 0533  HGB 8.0*  --   --   --  7.7*  --  7.2*  HCT 23.7*  --   --   --  23.8*  --  22.4*  PLT 237  --   --   --   --   --  202  APTT  --  26  --  61*  --   --   --   LABPROT  --  13.7  --  14.1  --   --   --   INR  --  1.0  --  1.1  --   --   --   HEPARINUNFRC  --   --    < >  --  0.22* 0.46 0.43  CREATININE 1.00  --   --   --   --   --   --    < > = values in this interval not displayed.    Estimated Creatinine Clearance: 75.3 mL/min (by C-G formula based on SCr of 1 mg/dL).   Medications:  Scheduled:   Chlorhexidine Gluconate Cloth  6 each Topical Daily   cholecalciferol  1,000 Units Oral Daily   vitamin B-6  12.5 mg Oral BID   And   doxylamine (Sleep)  12.5 mg Oral BID   DULoxetine  20 mg Oral Daily   feeding supplement (GLUCERNA SHAKE)  237 mL Oral BID BM   furosemide  40 mg Intravenous Once   hydrALAZINE  25 mg Oral Q8H   hydrocortisone sod succinate (SOLU-CORTEF) inj  70 mg Intravenous Daily   Followed by   03/24/21 ON 01/23/2021] hydrocortisone sod succinate (SOLU-CORTEF) inj  60 mg Intravenous Daily   Followed by   03/25/2021 ON 01/25/2021] hydrocortisone sod succinate (SOLU-CORTEF) inj  50 mg Intravenous Daily   Followed by   03/27/2021 ON 01/27/2021] hydrocortisone sod succinate (SOLU-CORTEF) inj  40 mg Intravenous Daily   mouth rinse  15 mL Mouth Rinse q12n4p   metoCLOPramide (REGLAN) injection  5 mg Intravenous Q6H   pantoprazole  40 mg Intravenous  Q12H   prenatal vitamin w/FE, FA  1 tablet Oral Q1200   sodium chloride  1 spray Each Nare Q2H   sodium chloride flush  10-40 mL Intracatheter Q12H   sucralfate  1 g Oral TID WC & HS   vitamin B-12  1,000 mcg Oral Daily   Infusions:   albumin human     dextrose 5% lactated ringers 50 mL/hr at 01/21/21 1729   famotidine (PEPCID) IV 20 mg (01/21/21 2101)   heparin Stopped (01/21/21 2135)   insulin 1.1 Units/hr (01/22/21 0843)    Assessment: 30 YO female 30weeks 3 days pregnant on IV heparin for right brachial DVT. Heparin rate currently at 1350 units/hr (13.5 ml/hr). Confirmatory level obtained this morning is therapeutic at 0.43. No current s/sx of bleeding.   Goal of Therapy:  Heparin level 0.3-0.7 units/ml Monitor platelets by anticoagulation protocol: Yes   Plan:  Continue heparin  drip at current rate (1350 units/hr). Obtain heparin daily. Continue to monitor H&H and platelets.  Benetta Spar Lovelee Forner 01/22/2021,9:40 AM

## 2021-01-23 LAB — CBC
HCT: 22 % — ABNORMAL LOW (ref 36.0–46.0)
Hemoglobin: 7.1 g/dL — ABNORMAL LOW (ref 12.0–15.0)
MCH: 30.5 pg (ref 26.0–34.0)
MCHC: 32.3 g/dL (ref 30.0–36.0)
MCV: 94.4 fL (ref 80.0–100.0)
Platelets: 203 10*3/uL (ref 150–400)
RBC: 2.33 MIL/uL — ABNORMAL LOW (ref 3.87–5.11)
RDW: 16.5 % — ABNORMAL HIGH (ref 11.5–15.5)
WBC: 11 10*3/uL — ABNORMAL HIGH (ref 4.0–10.5)
nRBC: 0 % (ref 0.0–0.2)

## 2021-01-23 LAB — GLUCOSE, CAPILLARY
Glucose-Capillary: 93 mg/dL (ref 70–99)
Glucose-Capillary: 111 mg/dL — ABNORMAL HIGH (ref 70–99)
Glucose-Capillary: 56 mg/dL — ABNORMAL LOW (ref 70–99)
Glucose-Capillary: 96 mg/dL (ref 70–99)
Glucose-Capillary: 117 mg/dL — ABNORMAL HIGH (ref 70–99)

## 2021-01-23 LAB — COMPREHENSIVE METABOLIC PANEL
ALT: 58 U/L — ABNORMAL HIGH (ref 0–44)
AST: 54 U/L — ABNORMAL HIGH (ref 15–41)
Albumin: 2.3 g/dL — ABNORMAL LOW (ref 3.5–5.0)
Alkaline Phosphatase: 78 U/L (ref 38–126)
Anion gap: 5 (ref 5–15)
BUN: 20 mg/dL (ref 6–20)
CO2: 23 mmol/L (ref 22–32)
Calcium: 8 mg/dL — ABNORMAL LOW (ref 8.9–10.3)
Chloride: 108 mmol/L (ref 98–111)
Creatinine, Ser: 1.14 mg/dL — ABNORMAL HIGH (ref 0.44–1.00)
GFR, Estimated: >60 mL/min (ref >60)
Glucose, Bld: 134 mg/dL — ABNORMAL HIGH (ref 70–99)
Potassium: 3.8 mmol/L (ref 3.5–5.1)
Sodium: 136 mmol/L (ref 135–145)
Total Bilirubin: 0.6 mg/dL (ref 0.3–1.2)
Total Protein: 4.5 g/dL — ABNORMAL LOW (ref 6.5–8.1)

## 2021-01-23 LAB — APTT: aPTT: 139 seconds — ABNORMAL HIGH (ref 24–36)

## 2021-01-23 LAB — TYPE AND SCREEN
ABO/RH(D): A POS
Antibody Screen: NEGATIVE

## 2021-01-23 LAB — HEPARIN LEVEL (UNFRACTIONATED): Heparin Unfractionated: 0.62 IU/mL (ref 0.30–0.70)

## 2021-01-23 MED ORDER — FUROSEMIDE 10 MG/ML IJ SOLN
40.0000 mg | Freq: Once | INTRAMUSCULAR | Status: AC
  Administered 2021-01-23: 40 mg via INTRAVENOUS
  Filled 2021-01-23: qty 4

## 2021-01-23 MED ORDER — DEXTROSE 10 % IV SOLN
INTRAVENOUS | Status: DC
  Administered 2021-01-30: 25 mL/h via INTRAVENOUS
  Filled 2021-01-23 (×4): qty 1000

## 2021-01-23 MED ORDER — DEXTROSE IN LACTATED RINGERS 5 % IV SOLN: INTRAVENOUS | Status: DC

## 2021-01-23 MED ORDER — DEXTROSE 50 % IV SOLN
INTRAVENOUS | Status: AC
  Administered 2021-01-23: 25 mL
  Filled 2021-01-23: qty 50

## 2021-01-23 MED ORDER — ALBUMIN HUMAN 25 % IV SOLN
12.5000 g | Freq: Once | INTRAVENOUS | Status: AC
  Administered 2021-01-23: 12.5 g via INTRAVENOUS
  Filled 2021-01-23: qty 50

## 2021-01-23 NOTE — Progress Notes (Signed)
PROGRESS NOTE  Ashley Camacho  XLK:[id number] DOB: 03-22-1991 DOA: 12/16/2020 PCP: Hildred Laser, MD   Brief Narrative: Ashley Camacho is a 30 y.o. G2P0101 with a history of T1DM, chronic HFrEF who was admitted initially 6/30 to [facility] for nausea and vomiting subsequently transferred to [facility] for rhinovirus pneumonia, discharged 7/20 only to return 7/22 with starvation ketosis and brachial vein DVT. TPN thru PICC was started and ultimately the patient was transferred to [facility] for cortrack placement on 7/26. Further complications detailed below:  7/27 cardiology consulted for cardiomyopathy, PICC line inserted, tube feeding started. 7/29 having recurrent retention, Foley catheter inserted 7/30 PRBC transfused 8/5 PRBC transfused 8/16 cardiology signed off, no follow-up recommended 8/19 nephrology consulted, no follow-up recommended 8/20 PRBC transfused 8/21 rescue course of betamethasone 8/22 Foley catheter reinserted after removal due to retention. 8/23 GI consulted. MFM consulted, nephrology signed off. 8/24 underwent EGD shows severe esophagitis with mild oozing.   Dobbhoff removed patient on oral diet. 8/25 PICC line removed. 8/26 started on steroids for adrenal insufficiency. 8/27 2 episodes of hematemesis.  Started on PPI drip.  GI reconsulted.  Lovenox held. 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 8/30 PICC line reinserted due to poor IV access, difficulty with blood draws and need for IV heparin.  Assessment & Plan: Active Problems:   Delivery with history of C-section   Anemia affecting pregnancy in second trimester   Acute kidney injury (HCC)   Hyperemesis   Pregnancy with 24 completed weeks gestation   DKA, type 1, not at goal [facility])   Starvation ketoacidosis   Acute deep vein thrombosis (DVT) of brachial vein of right upper extremity (HCC)   Acute respiratory failure with hypoxia (HCC)   Vitreous hemorrhage of right eye  (HCC)   Gastroparesis due to DM (HCC)   Diabetic retinopathy (HCC)   Acute on chronic combined systolic and diastolic CHF (congestive heart failure) (HCC)   Type 1 diabetes mellitus affecting pregnancy in second trimester, antepartum   HFrEF (heart failure with reduced ejection fraction) (HCC)   [redacted] weeks gestation of pregnancy   Acute respiratory distress   Anemia during pregnancy   Pre-existing type 1 diabetes mellitus during pregnancy in second trimester   Pregnancy with 28 completed weeks gestation   History of preterm delivery, currently pregnant   Leg edema   Nasogastric tube present   Acute esophagitis   Pregnancy with 29 completed weeks gestation  Hematemesis due to grade C esophagitis: Noted on EGD 8/24.  - Continue PPI IV, pepcid IV, and carafate.  - Monitor CBC as below.  - Continue soft/dysphagia diet as tolerated. GI not planning repeat endoscopic evaluation at this time. ?if cortrack would be unadvisable with severe esophagitis.   Hyperemesis, intractable nausea and vomiting, gastroparesis:  - Continue scheduled reglan and prn zofran - Cortrack removed 8/24. If unable to tolerate adequate po intake, would need to consider reinsertion vs. TPN. Will initiate formal calorie count.  Acute blood loss anemia on anemia of pregnancy:  - Trend H/H with transfusion threshold of 7g/dl. Slight downtrend to 7.1g/dl on 9/1 in absence of gross bleeding. Has received 5 total units PRBCs during this admission. Hoping to space out blood draws, but having to check more frequently while diuresing.   Adrenal insufficiency: ACTH stim test diagnostic.  - Continue hydrocortisone taper as ordered. Plan eventual dosing 20mg  qAM, 10mg  qPM.  Right brachial vein DVT: Dx 7/17. Seems to be recannulating based on  repeat U/S.  - Continue therapeutic anticoagulation with heparin. Plan to convert back to lovenox if no further hematemesis, and stable hgb on 9/2.  T1DM with gastroparesis and peripheral  neuropathy: Brittle control due to sensitivity and inconsistent calorie inputs. HbA1c was 5.8% 11/21/2020 (impacted by mid-late pregnancy).  - Continue insulin gtt and dextrose-containing IVF to prevent ketosis while po intake is minimal. Hope to transition back to insulin pump soon as her intake is becoming more reliable and her mentation is clearing. With hypoglycemia this morning, will not initiate transition at this time. The sensitivity she's exhibiting and lows make transitioning to basal-bolus insulin unwise.  Anasarca due to severe malnutrition related to hyperemesis as above as well as acute on chronic HFrEF: Updated echo showed improved LVEF 45% now up to 50-55%. RV and pulmonary artery pressures wnl. Small pericardial effusion noted without tamponade.  - Continue to suspect malnutrition/hypoalbuminemia is driver of edema. Will repeat albumin + lasix to shift volume intravascular and diurese. I/O reviewed since admission 7/26 is net positive at least 10L and weight up ~3kg (though this can be expected in this setting). Down nearly 3L over past 24 hours with lasix, though had concomitant equivalent intake inclusive of gtt's. Will change D5 to D10 and half the rate (to decrease total daily volume 1,200cc > 600cc).   Acute urinary retention: Right hydronephrosis is likely physiologic in this setting.  - Voiding trial.   AKI: Improved. - Cr slightly up with rising BUN after albumin + lasix, will monitor again tomorrow AM.  IUP: [redacted]w[redacted]d  - MFM recommends rLTCS at 34 weeks (9/20). Continue NST weekly (qMonday). - Has received BMZ - OB co-managing   UTI: Treated with amoxicillin 8/16 - 8/22  Limited venous access:  - PICC reinserted 8/30.  DVT prophylaxis: Heparin gtt Code Status: Full Family Communication: None at bedside Disposition Plan:  Status is: Inpatient  Remains inpatient appropriate because:IV treatments appropriate due to intensity of illness or inability to take PO  Dispo:  The patient is from: Home              Anticipated d/c is to: Home              Patient currently is not medically stable to d/c.  Consultants:  OB/GYN Nephrology Cardiology GI  Subjective: No bleeding, eating a fair amount better without vomiting, mild nausea. Working with therapy consistently, found it taxing just to get to bedside chair.   Objective: BP 137/63 (BP Location: Right Leg)   Pulse 91   Temp 98 F (36.7 C) (Oral)   Resp 18   Ht 5\' 1"  (1.549 m)   Wt 72 kg   LMP  (LMP Unknown)   SpO2 98%   BMI 29.99 kg/m   Gen: 30 y.o. female in no distress Pulm: Nonlabored breathing room air. Clear. CV: Regular rate and rhythm. No murmur, rub, or gallop. No JVD, stable significant diffuse but predominantly dependent edema. GI: Abdomen soft, non-tender, gravid, non-distended, with normoactive bowel sounds.  Ext: Warm, no deformities Skin: No new rashes, lesions or ulcers on visualized skin. Neuro: Alert and oriented. No focal neurological deficits. Psych: Judgement and insight appear fair. Mood euthymic & affect congruent. Behavior is appropriate.     LOS: 38 days   Time spent: 35 minutes.  37, MD [facility] [email or web address] 01/23/2021, 6:42 PM

## 2021-01-23 NOTE — Care Management (Addendum)
CM continues to follow patient will inpatient for any dc needs. [facility] referral has already been  made for community follow up.  Gretchen Short RNC-MNN, BSN Transitions of Care [facility]

## 2021-01-23 NOTE — Progress Notes (Addendum)
Calorie Count Note  48 hour calorie count ordered.  Diet: dysphagia 3 Supplements: Glucerna BID    Total intake: 1045 kcal (61% of minimum estimated needs)  50 g protein (55% of minimum estimated needs) Pt ate ~ 1/2 of breakfast, no lunch, all of dinner. Declined 1 glucerna.  Pt reports Glucerna will at times hurt her stomach - could be mixed with milk as she consumes that well  Nutrition Dx: Inadequate oral intake r/t hyperemesis/ esophagitis/gastroparesis  Goal: pt will meet 90 % of est needs  Intervention: continue dysphagia 3 diet, Glucerna BID

## 2021-01-23 NOTE — Progress Notes (Addendum)
Patient ID: Ashley Camacho, female   DOB: 1990/09/11, 30 y.o.   MRN: [id number] FACULTY PRACTICE ANTEPARTUM(COMPREHENSIVE) NOTE  Ashley Camacho is a 30 y.o. G2P0101 at [redacted]w[redacted]d  who is admitted for N/V, DM1 complications in pregnancy.  Estimated Date of Delivery: 11/7/22Fetal presentation is cephalic. Length of Stay:  38  Days  Subjective: Still feeling better and improved appetite Patient reports the fetal movement as active. Patient reports uterine contraction  activity as none. Patient reports  vaginal bleeding as none. Patient describes fluid per vagina as None.  Vitals:  Blood pressure 135/60, pulse 84, temperature 97.9 F (36.6 C), temperature source Oral, resp. rate 17, height 5\' 1"  (1.549 m), weight 72 kg, SpO2 98 %. Physical Examination:  General appearance - alert, well appearing, and in no distress Heart - normal rate and regular rhythm Abdomen - soft, nontender, nondistended Fundal Height:  size equals dates Extremities: extremities normal, atraumatic, no cyanosis or edema and Homans sign is negative, no sign of DVT with DTRs 2+ bilaterally Membranes:intact  Fetal Monitoring:   Fetal Heart Rate A   Mode External  [removed] filed at 01/23/2021 0658  Baseline Rate (A) 140 bpm filed at 01/23/2021 0658  Variability <5 BPM, 6-25 BPM filed at 01/23/2021 0658  Accelerations 15 x 15 filed at 01/23/2021 0658  Decelerations None filed at 01/23/2021 03/25/2021    Labs:  Results for orders placed or performed during the hospital encounter of 12/16/20 (from the past 24 hour(s))  Glucose, capillary   Collection Time: 01/22/21 11:53 PM  Result Value Ref Range   Glucose-Capillary 162 (H) 70 - 99 mg/dL  Glucose, capillary   Collection Time: 01/23/21  3:59 AM  Result Value Ref Range   Glucose-Capillary 93 70 - 99 mg/dL  Heparin level (unfractionated)   Collection Time: 01/23/21  5:00 AM  Result Value Ref Range   Heparin Unfractionated 0.62 0.30 - 0.70 IU/mL  Glucose, capillary    Collection Time: 01/23/21  6:10 AM  Result Value Ref Range   Glucose-Capillary 56 (L) 70 - 99 mg/dL  Glucose, capillary   Collection Time: 01/23/21  6:21 AM  Result Value Ref Range   Glucose-Capillary 111 (H) 70 - 99 mg/dL   Comment 1 Document in Chart   Glucose, capillary   Collection Time: 01/23/21 10:39 AM  Result Value Ref Range   Glucose-Capillary 96 70 - 99 mg/dL     Medications:  Scheduled  Chlorhexidine Gluconate Cloth  6 each Topical Daily   cholecalciferol  1,000 Units Oral Daily   vitamin B-6  12.5 mg Oral BID   And   doxylamine (Sleep)  12.5 mg Oral BID   DULoxetine  20 mg Oral Daily   famotidine  20 mg Oral BID   feeding supplement (GLUCERNA SHAKE)  237 mL Oral BID BM   hydrALAZINE  25 mg Oral Q8H   hydrocortisone sod succinate (SOLU-CORTEF) inj  60 mg Intravenous Daily   Followed by   03/25/21 ON 01/25/2021] hydrocortisone sod succinate (SOLU-CORTEF) inj  50 mg Intravenous Daily   Followed by   03/27/2021 ON 01/27/2021] hydrocortisone sod succinate (SOLU-CORTEF) inj  40 mg Intravenous Daily   mouth rinse  15 mL Mouth Rinse q12n4p   metoCLOPramide (REGLAN) injection  5 mg Intravenous Q6H   pantoprazole  40 mg Intravenous Q12H   prenatal vitamin w/FE, FA  1 tablet Oral Q1200   sodium chloride  1 spray Each Nare Q2H   sodium chloride flush  10-40 mL Intracatheter Q12H  sucralfate  1 g Oral TID WC & HS   vitamin B-12  1,000 mcg Oral Daily   I have reviewed the patient's current medications.  ASSESSMENT: Patient Active Problem List   Diagnosis Date Noted   Pregnancy with 29 completed weeks gestation    Acute esophagitis    Nasogastric tube present    Leg edema    Pregnancy with 28 completed weeks gestation    History of preterm delivery, currently pregnant    Pre-existing type 1 diabetes mellitus during pregnancy in second trimester    Acute respiratory distress    Anemia during pregnancy    [redacted] weeks gestation of pregnancy    Type 1 diabetes mellitus affecting  pregnancy in second trimester, antepartum    HFrEF (heart failure with reduced ejection fraction) (HCC)    Acute deep vein thrombosis (DVT) of brachial vein of right upper extremity (HCC) 12/17/2020   Acute respiratory failure with hypoxia (HCC) 12/17/2020   Anxiety 12/17/2020   Diabetic retinopathy (HCC) 12/17/2020   Acute on chronic combined systolic and diastolic CHF (congestive heart failure) (HCC)    Starvation ketoacidosis 12/16/2020   Hyperemesis 12/13/2020   Pregnancy with 24 completed weeks gestation 12/13/2020   DKA, type 1, not at goal [facility]) 12/13/2020   Sunburn 12/13/2020   Hypothyroidism 12/10/2020   Malnutrition (HCC) 12/10/2020   Vitreous hemorrhage of right eye (HCC) 12/10/2020   Pleural effusion associated with pulmonary infection 11/25/2020   Pneumonia affecting pregnancy 11/24/2020   Diabetes mellitus affecting pregnancy, second trimester 11/24/2020   Edema 11/24/2020   Decreased urine output 11/24/2020   Shortness of breath    Zinc deficiency    SOB (shortness of breath)    Social problem 11/21/2020   Nausea and vomiting during pregnancy prior to [redacted] weeks gestation 11/21/2020   Low serum vitamin B12    Delivery with history of C-section 11/20/2020   Anemia affecting pregnancy in second trimester    Absolute anemia    Acute kidney injury (HCC)    Nausea and vomiting during pregnancy 10/23/2020   History of premature delivery, currently pregnant, second trimester 10/13/2020   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   Depressive disorder 09/28/2018   Neuropathy 09/28/2018   Gastroparesis due to DM (HCC) 04/22/2016   Diabetic sensorimotor neuropathy (HCC) 04/19/2016   DKA (diabetic ketoacidoses) 04/03/2016   Microalbuminuria 09/26/2005    PLAN:  1) OB care               BPP weekly, last completed 8/24             Growth q 4 wks, last completed 8/22             S/p BMZ 8/23-24 -MFM following- see their prior note for details   2)  GI Nausea/vomiting/esophagitis             Tolerating carb modified diet, Glucerna             Plan to slowly transition IV anti-emetics to oral             Continue pepcid, protonix, carafate             S/p EGD with GI on 8/24             PPI drip and clear liquids for diet d/t hematemesis Diarrhea- improved, C. Diff neg Malnutrition             Dietician on board  B12 replacement per IM             On daily vitamin  She may wish to advance her diet    3) ENDO - Type 1 DM             On endo tool- patient has fluctuating needs and may still need IV dosing but will see if pump can be started soon. Discussed with Dr. Jarvis Newcomer and diabetes coordinator               - Adrenal insufficiency             Diagnosed on 8/26 and started on steriods   4) Neph/GU -UOP remains adequate -  5) Heme -Anemia             Work up per IM Fecal occult positive- GI aware, in the s/o pregnancy, will hold off on colonoscopy at this time.  If Hgb worsens and assessment/intervention is warranted may reconsider  HgB stable this morning             -h/o right brachial VTE             Heparin             level followed by pharmacy   6) Cardio -HTN- currently on Hydralazine 25mg  q8hr - BP returned to baseline with administration of steroids   -s/p cardiology consult CHF stable   7) Maternal care   -encourage ambulation, PT/OT on board, advancing diet encouraged   Plan as outlined above, continue with supportive care with co-management per IM.  Goal for delivery @ 34wk via Repeat C-section and bilateral tubal sterilization if maternal/fetal well being remains stable. Medicaid tubal papers signed and on her paper chart.       01/23/2021,1:11 PM

## 2021-01-23 NOTE — Progress Notes (Signed)
Physical Therapy Treatment Patient Details Name: Ashley Camacho MRN: [id number] DOB: Jan 28, 1991 Today's Date: 01/23/2021    History of Present Illness 30 y.o. 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 Medical history significant of [redacted]w[redacted]d on presentation, Estimated Date of Delivery: 03/30/21 by by 9 week ultrasound however trying to make it to 34w for planned c-section, Type 1 diabetic, Cardiomyopathy, chronic systolic heart failure, DVT in bilateral arm. H/o recent hospitalizations d/t hyperemesis and sepsis secondary to pneumonia.    PT Comments    Pt is progressing well with mobility, knows we are doing two laps now, reports her right knee is better today now that the TEDs have been removed.  I don't think she could have progressed much to gait training without a device quite yet.  PT will continue to follow acutely for safe mobility progression.  Follow Up Recommendations  No PT follow up     Equipment Recommendations  None recommended by PT    Recommendations for Other Services       Precautions / Restrictions Precautions Precaution Comments: swelling in LEs, pregnancy and CHF    Mobility  Bed Mobility Overal bed mobility: Modified Independent                  Transfers Overall transfer level: Needs assistance Equipment used: 4-wheeled walker Transfers: Sit to/from Stand Sit to Stand: Supervision;Min guard         General transfer comment: supervision for safety, line management, lower surfaces min guard  Ambulation/Gait Ambulation/Gait assistance: Supervision Gait Distance (Feet): 300 Feet Assistive device: 4-wheeled walker Gait Pattern/deviations: Step-through pattern;Wide base of support Gait velocity: decreased Gait velocity interpretation: <1.8 ft/sec, indicate of risk for recurrent falls General Gait Details: 4 seated rest breaks and two  laps around the loop of the unit.  Pt starting to know that I am going to request two laps, TEDs d/c and she now has SCDs.  The TEDs were pushing too much fluid up to her thighs and causing more pain   Stairs             Wheelchair Mobility    Modified Rankin (Stroke Patients Only)       Balance Overall balance assessment: Mild deficits observed, not formally tested                                          Cognition Arousal/Alertness: Awake/alert Behavior During Therapy: WFL for tasks assessed/performed Overall Cognitive Status: Within Functional Limits for tasks assessed                                        Exercises      General Comments        Pertinent Vitals/Pain Pain Assessment: Faces Faces Pain Scale: Hurts even more Pain Location: right distal thigh Pain Descriptors / Indicators: Discomfort Pain Intervention(s): Limited activity within patient's tolerance;Monitored during session;Repositioned    Home Living                      Prior Function            PT Goals (current goals can now be found in the care plan section) Acute Rehab PT Goals Patient Stated Goal:  decrease leg pain and swelling Progress towards PT goals: Progressing toward goals    Frequency    Min 3X/week      PT Plan Current plan remains appropriate    Co-evaluation              AM-PAC PT "6 Clicks" Mobility   Outcome Measure  Help needed turning from your back to your side while in a flat bed without using bedrails?: None Help needed moving from lying on your back to sitting on the side of a flat bed without using bedrails?: None Help needed moving to and from a bed to a chair (including a wheelchair)?: A Little Help needed standing up from a chair using your arms (e.g., wheelchair or bedside chair)?: A Little Help needed to walk in hospital room?: A Little Help needed climbing 3-5 steps with a railing? : A Little 6  Click Score: 20    End of Session   Activity Tolerance: Patient limited by fatigue;Patient limited by pain Patient left:  (seated EOB) Nurse Communication: Mobility status PT Visit Diagnosis: Other abnormalities of gait and mobility (R26.89);Muscle weakness (generalized) (M62.81) Pain - Right/Left: Right Pain - part of body: Knee     Time: [id number] (chatted for quite a while in the beginning, no charge for that time) PT Time Calculation (min) (ACUTE ONLY): 61 min  Charges:  $Gait Training: 23-37 mins                    Corinna Capra, PT, DPT  Acute Rehabilitation Ortho Tech Supervisor [phone] pager [phone]) [phone] office

## 2021-01-23 NOTE — Progress Notes (Signed)
ANTICOAGULATION CONSULT NOTE - Follow Up Consult  Pharmacy Consult for heparin Indication:  right brachial VTE  No Known Allergies  Patient Measurements: Height: 5\' 1"  (154.9 cm) Weight: 72 kg (158 lb 11.7 oz) IBW/kg (Calculated) : 47.8 Heparin Dosing Weight: 63.4kg  Vital Signs: Temp: 97.9 F (36.6 C) (09/02 1139) Temp Source: Oral (09/02 1139) BP: 135/60 (09/02 1139) Pulse Rate: 84 (09/02 1139)  Labs: Recent Labs    01/20/21 1745 01/21/21 0016 01/21/21 0516 01/21/21 0848 01/21/21 1811 01/22/21 0533 01/23/21 0500  HGB  --   --   --  7.7*  --  7.2*  --   HCT  --   --   --  23.8*  --  22.4*  --   PLT  --   --   --   --   --  202  --   APTT 26  --  61*  --   --   --   --   LABPROT 13.7  --  14.1  --   --   --   --   INR 1.0  --  1.1  --   --   --   --   HEPARINUNFRC  --    < >  --  0.22* 0.46 0.43 0.62   < > = values in this interval not displayed.    Estimated Creatinine Clearance: 75.3 mL/min (by C-G formula based on SCr of 1 mg/dL).   Medications:  Scheduled:   Chlorhexidine Gluconate Cloth  6 each Topical Daily   cholecalciferol  1,000 Units Oral Daily   vitamin B-6  12.5 mg Oral BID   And   doxylamine (Sleep)  12.5 mg Oral BID   DULoxetine  20 mg Oral Daily   famotidine  20 mg Oral BID   feeding supplement (GLUCERNA SHAKE)  237 mL Oral BID BM   hydrALAZINE  25 mg Oral Q8H   hydrocortisone sod succinate (SOLU-CORTEF) inj  60 mg Intravenous Daily   Followed by   03/25/21 ON 01/25/2021] hydrocortisone sod succinate (SOLU-CORTEF) inj  50 mg Intravenous Daily   Followed by   03/27/2021 ON 01/27/2021] hydrocortisone sod succinate (SOLU-CORTEF) inj  40 mg Intravenous Daily   mouth rinse  15 mL Mouth Rinse q12n4p   metoCLOPramide (REGLAN) injection  5 mg Intravenous Q6H   pantoprazole  40 mg Intravenous Q12H   prenatal vitamin w/FE, FA  1 tablet Oral Q1200   sodium chloride  1 spray Each Nare Q2H   sodium chloride flush  10-40 mL Intracatheter Q12H   sucralfate  1 g  Oral TID WC & HS   vitamin B-12  1,000 mcg Oral Daily    Assessment: Heparin level reported as 0.62 units/ml this morning and is within therapeutic range. No s/s of bleeding.  Goal of Therapy:  Heparin level 0.3-0.7 units/ml Monitor platelets by anticoagulation protocol: Yes   Plan: Continue current Heparin rate of 1350 units/hr (13.5 ml/hr). Continue to monitor H&H and platelets  Hovey-Rankin, Urian Martenson 01/23/2021,1:35 PM

## 2021-01-23 NOTE — Progress Notes (Addendum)
Date and time results received: 01/23/21 0610  Test: CBG Critical Value: 63, 56  Name of Provider Notified: Dr. Macon Large notified at [phone]  Orders Received? Or Actions Taken?: Per Endotool instruction, Insulin drip paused and D50 given.   (Added) Pt does not report any symptoms. CBG at recheck was 111.

## 2021-01-23 NOTE — Progress Notes (Signed)
Occupational Therapy Treatment Patient Details Name: Ashley Camacho MRN: [id number] DOB: 09/25/1990 Today's Date: 01/23/2021    History of present illness 30 y.o. 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 Medical history significant of [redacted]w[redacted]d on presentation, Estimated Date of Delivery: 03/30/21 by by 9 week ultrasound however trying to make it to 34w for planned c-section, Type 1 diabetic, Cardiomyopathy, chronic systolic heart failure, DVT in bilateral arm. H/o recent hospitalizations d/t hyperemesis and sepsis secondary to pneumonia.   OT comments  Pt progressing towards acute OT goals. Pt c/o 8/10 bilateral knee pain which limited activity tolerance today. Nursing notified and gave pt pain med at start of session per pt request. Pt was seated EOB upon arrival of OT. Min guard assist (+1 bilateral HHA in lieu of walker) to walk the length of her room to sit up in the recliner for breakfast. Discussed pt's goals/concerns as she looks to transition home in the future. Pt reports no change in home status: lives with her partner and 18 yo child who just started 4th grade. She does not drive but has access to transportation to appointments. Partner works nights and sleeps in the day. She verbalized her main concern as she thinks about her eventual return home is her medical condition. When asked, she reports she does not have a PCP and indicated she would appreciate assistance with finding a PCP to establish with (pt resides in [place]). When asked, she reports her and her partner have obtained/are able to obtain the needed supplies for receiving a baby into the home (car seat, clothes, diapers, crib, etc). Given complexity of pt's medical and social history recommend Case Management consult for pt and spoke with Attending MD. D/c plan remains appropriate.    Follow Up Recommendations  No OT  follow up;Supervision/Assistance - 24 hour    Equipment Recommendations  None recommended by OT    Recommendations for Other Services Other (comment) (Case Management)    Precautions / Restrictions Precautions Precautions: Other (comment) Precaution Comments: swelling in LEs, pregnancy and CHF       Mobility Bed Mobility               General bed mobility comments: pt at EOB    Transfers Overall transfer level: Needs assistance Equipment used: 1 person hand held assist Transfers: Sit to/from Stand Sit to Stand: Min guard         General transfer comment: +1 HHA in lieu of walker. From EOB to recliner. Limited by 8/10 B knee pain. Min guard for stability.    Balance Overall balance assessment: Mild deficits observed, not formally tested                                         ADL either performed or assessed with clinical judgement   ADL Overall ADL's : Needs assistance/impaired                                     Functional mobility during ADLs: Min guard General ADL Comments: Pt sitting EOB upon arrival of OT. Walked across room to sit up in recliner. 8/10 B knee pain a limiting factor. In lieu of walker, pt held therapist hands for bilateral support with OT facing pt (OT  walking backwards) for stability and pain relief via off loading. Pt up in relincer, leg rest up and legs further boosted on pillows for elevation and comfort     Vision       Perception     Praxis      Cognition Arousal/Alertness: Awake/alert Behavior During Therapy: WFL for tasks assessed/performed Overall Cognitive Status: Within Functional Limits for tasks assessed                                 General Comments: continues to be more interactive and able to engage more in sessions        Exercises     Shoulder Instructions       General Comments      Pertinent Vitals/ Pain       Pain Assessment: 0-10 Pain Score: 8   Pain Location: B knees Pain Descriptors / Indicators: Tightness;Grimacing;Throbbing;Guarding Pain Intervention(s): Monitored during session;Limited activity within patient's tolerance;Repositioned;Patient requesting pain meds-RN notified;Other (comment) (applied warm blankets)  Home Living                                          Prior Functioning/Environment              Frequency  Min 2X/week        Progress Toward Goals  OT Goals(current goals can now be found in the care plan section)  Progress towards OT goals: Progressing toward goals  Acute Rehab OT Goals Patient Stated Goal: to take a shower OT Goal Formulation: With patient Time For Goal Achievement: 01/29/21 Potential to Achieve Goals: Good ADL Goals Pt Will Perform Grooming: Independently;standing Pt Will Perform Lower Body Bathing: Independently;sit to/from stand Pt Will Perform Lower Body Dressing: Independently;sit to/from stand Pt Will Transfer to Toilet: Independently;ambulating;regular height toilet Pt Will Perform Toileting - Clothing Manipulation and hygiene: Independently;sit to/from stand Pt/caregiver will Perform Home Exercise Program: Increased ROM;Both right and left upper extremity;Independently  Plan Discharge plan remains appropriate    Co-evaluation                 AM-PAC OT "6 Clicks" Daily Activity     Outcome Measure   Help from another person eating meals?: None Help from another person taking care of personal grooming?: A Little Help from another person toileting, which includes using toliet, bedpan, or urinal?: A Little Help from another person bathing (including washing, rinsing, drying)?: A Little Help from another person to put on and taking off regular upper body clothing?: None Help from another person to put on and taking off regular lower body clothing?: A Little 6 Click Score: 20    End of Session    OT Visit Diagnosis: Muscle weakness  (generalized) (M62.81);Pain;Feeding difficulties (R63.3)   Activity Tolerance Patient limited by pain   Patient Left in chair;with call bell/phone within reach;Other (comment) (with MD present)   Nurse Communication Patient requests pain meds        Time: [phone] OT Time Calculation (min): 31 min  Charges: OT General Charges $OT Visit: 1 Visit OT Treatments $Self Care/Home Management : 8-22 mins $Therapeutic Activity: 8-22 mins  Raynald Kemp, OT Acute Rehabilitation Services Pager: [phone] Office: [phone]    Pilar Grammes 01/23/2021, 10:47 AM

## 2021-01-24 LAB — BASIC METABOLIC PANEL
Anion gap: 7 (ref 5–15)
BUN: 20 mg/dL (ref 6–20)
CO2: 24 mmol/L (ref 22–32)
Calcium: 7.9 mg/dL — ABNORMAL LOW (ref 8.9–10.3)
Chloride: 105 mmol/L (ref 98–111)
Creatinine, Ser: 1.07 mg/dL — ABNORMAL HIGH (ref 0.44–1.00)
GFR, Estimated: >60 mL/min (ref >60)
Glucose, Bld: 207 mg/dL — ABNORMAL HIGH (ref 70–99)
Potassium: 3.2 mmol/L — ABNORMAL LOW (ref 3.5–5.1)
Sodium: 136 mmol/L (ref 135–145)

## 2021-01-24 LAB — APTT: aPTT: 113 seconds — ABNORMAL HIGH (ref 24–36)

## 2021-01-24 LAB — CBC
HCT: 21.7 % — ABNORMAL LOW (ref 36.0–46.0)
Hemoglobin: 7.2 g/dL — ABNORMAL LOW (ref 12.0–15.0)
MCH: 31.2 pg (ref 26.0–34.0)
MCHC: 33.2 g/dL (ref 30.0–36.0)
MCV: 93.9 fL (ref 80.0–100.0)
Platelets: 171 10*3/uL (ref 150–400)
RBC: 2.31 MIL/uL — ABNORMAL LOW (ref 3.87–5.11)
RDW: 16.2 % — ABNORMAL HIGH (ref 11.5–15.5)
WBC: 9.1 10*3/uL (ref 4.0–10.5)
nRBC: 0.2 % (ref 0.0–0.2)

## 2021-01-24 LAB — GLUCOSE, CAPILLARY: Glucose-Capillary: 103 mg/dL — ABNORMAL HIGH (ref 70–99)

## 2021-01-24 LAB — HEPARIN LEVEL (UNFRACTIONATED): Heparin Unfractionated: 0.53 IU/mL (ref 0.30–0.70)

## 2021-01-24 MED ORDER — FUROSEMIDE 10 MG/ML IJ SOLN
40.0000 mg | Freq: Once | INTRAMUSCULAR | Status: AC
  Administered 2021-01-24: 40 mg via INTRAVENOUS
  Filled 2021-01-24 (×2): qty 4

## 2021-01-24 MED ORDER — POTASSIUM CHLORIDE CRYS ER 20 MEQ PO TBCR
40.0000 meq | EXTENDED_RELEASE_TABLET | Freq: Two times a day (BID) | ORAL | Status: AC
  Administered 2021-01-24 (×2): 40 meq via ORAL
  Filled 2021-01-24 (×2): qty 2

## 2021-01-24 MED ORDER — HYDRALAZINE HCL 50 MG PO TABS
50.0000 mg | ORAL_TABLET | Freq: Three times a day (TID) | ORAL | Status: DC
  Administered 2021-01-24 – 2021-01-26 (×6): 50 mg via ORAL
  Filled 2021-01-24 (×6): qty 1

## 2021-01-24 NOTE — Progress Notes (Signed)
ANTICOAGULATION CONSULT NOTE - Follow Up Consult  Pharmacy Consult for heparin Indication:  right brachial VTE  No Known Allergies  Patient Measurements: Height: 5\' 1"  (154.9 cm) Weight: 81.7 kg (180 lb 1.9 oz) IBW/kg (Calculated) : 47.8 Heparin Dosing Weight: 63.4kg  Vital Signs: Temp: 98.3 F (36.8 C) (09/03 1501) Temp Source: Oral (09/03 1501) BP: 156/60 (09/03 1520) Pulse Rate: 88 (09/03 1520)  Labs: Recent Labs    01/22/21 0533 01/23/21 0500 01/23/21 1449 01/24/21 0555  HGB 7.2*  --  7.1* 7.2*  HCT 22.4*  --  22.0* 21.7*  PLT 202  --  203 171  APTT  --   --  139* 113*  HEPARINUNFRC 0.43 0.62  --  0.53  CREATININE  --   --  1.14* 1.07*     Estimated Creatinine Clearance: 75.2 mL/min (A) (by C-G formula based on SCr of 1.07 mg/dL (H)).   Medications:  Scheduled:   Chlorhexidine Gluconate Cloth  6 each Topical Daily   cholecalciferol  1,000 Units Oral Daily   vitamin B-6  12.5 mg Oral BID   And   doxylamine (Sleep)  12.5 mg Oral BID   DULoxetine  20 mg Oral Daily   famotidine  20 mg Oral BID   feeding supplement (GLUCERNA SHAKE)  237 mL Oral BID BM   hydrALAZINE  50 mg Oral Q8H   [START ON 01/25/2021] hydrocortisone sod succinate (SOLU-CORTEF) inj  50 mg Intravenous Daily   Followed by   03/27/2021 ON 01/27/2021] hydrocortisone sod succinate (SOLU-CORTEF) inj  40 mg Intravenous Daily   mouth rinse  15 mL Mouth Rinse q12n4p   metoCLOPramide (REGLAN) injection  5 mg Intravenous Q6H   pantoprazole  40 mg Intravenous Q12H   potassium chloride  40 mEq Oral BID   prenatal vitamin w/FE, FA  1 tablet Oral Q1200   sodium chloride  1 spray Each Nare Q2H   sodium chloride flush  10-40 mL Intracatheter Q12H   sucralfate  1 g Oral TID WC & HS   vitamin B-12  1,000 mcg Oral Daily    Assessment: Heparin level reported as 0.53 units/ml this morning and is within therapeutic range. No s/s of bleeding.  Goal of Therapy:  Heparin level 0.3-0.7 units/ml Monitor platelets by  anticoagulation protocol: Yes   Plan: Continue current Heparin rate of 1350 units/hr (13.5 ml/hr). Continue to monitor H&H and platelets  03/29/2021 01/24/2021,5:12 PM

## 2021-01-24 NOTE — Progress Notes (Signed)
Calorie Count Note  48 hour calorie count completed.  Diet: dysphagia 3 Supplements: Glucerna BID    Total intake: 9/2 1075 kcal (63% of minimum estimated needs)  52g protein (57% of minimum estimated needs) Pt ate ~ 1/2 of breakfast, no lunch, all of dinner. ( [place] sandwich)  2 Glucerna documented as given   Nutrition Dx: Inadequate oral intake r/t hyperemesis/ esophagitis/gastroparesis  Goal: pt will meet 90 % of est needs  Intervention: continue dysphagia 3 diet, Glucerna BID

## 2021-01-24 NOTE — Progress Notes (Signed)
FACULTY PRACTICE ANTEPARTUM PROGRESS NOTE Late entry, pt seen at 1415 Ashley Camacho is a 30 y.o. G2P0101 at [redacted]w[redacted]d who is admitted for persistent nausea and vomiting with Type 1 DM complications.  Estimated Date of Delivery: 03/30/21 Fetal presentation is cephalic.  Length of Stay:  39 Days. Admitted 12/16/2020  Subjective: Pt seen.  She is awake and able to talk.  She notes her nausea and vomiting is currently controlled.  She has no acute complaints at this time. Patient reports normal fetal movement.  She denies uterine contractions, denies bleeding and leaking of fluid per vagina.  Vitals:  Blood pressure (!) 156/60, pulse 88, temperature 98.3 F (36.8 C), temperature source Oral, resp. rate 17, height 5\' 1"  (1.549 m), weight 81.7 kg, SpO2 98 %. Physical Examination: CONSTITUTIONAL: Well-developed, well-nourished female in no acute distress.  HENT:  Normocephalic, atraumatic, External right and left ear normal. Oropharynx is clear and moist EYES: Conjunctivae and EOM are normal.  NECK: Normal range of motion, supple, no masses. SKIN: Skin is warm and dry. No rash noted. Not diaphoretic. No erythema. No pallor. NEUROLGIC: Alert and oriented to person, place, and time. Normal reflexes, muscle tone coordination. No cranial nerve deficit noted. PSYCHIATRIC: Normal mood and affect. Normal behavior. Normal judgment and thought content. CARDIOVASCULAR: Normal heart rate noted, regular rhythm RESPIRATORY: Effort and breath sounds normal, no problems with respiration noted MUSCULOSKELETAL: Normal range of motion. No edema and no tenderness. ABDOMEN: Soft, nontender, nondistended, gravid. CERVIX: deferred Vitals:   01/24/21 1501 01/24/21 1520  BP:  (!) 156/60  Pulse:  88  Resp:  17  Temp: 98.3 F (36.8 C)   SpO2:      Fetal monitoring: FHR: 150s bpm, Variability: moderate, Accelerations: Present, Decelerations: Absent , category 1 strip reactive Uterine activity: none  Results for  orders placed or performed during the hospital encounter of 12/16/20 (from the past 48 hour(s))  Glucose, capillary     Status: Abnormal   Collection Time: 01/22/21 11:53 PM  Result Value Ref Range   Glucose-Capillary 162 (H) 70 - 99 mg/dL    Comment: Glucose reference range applies only to samples taken after fasting for at least 8 hours.  Glucose, capillary     Status: None   Collection Time: 01/23/21  3:59 AM  Result Value Ref Range   Glucose-Capillary 93 70 - 99 mg/dL    Comment: Glucose reference range applies only to samples taken after fasting for at least 8 hours.  Heparin level (unfractionated)     Status: None   Collection Time: 01/23/21  5:00 AM  Result Value Ref Range   Heparin Unfractionated 0.62 0.30 - 0.70 IU/mL    Comment: (NOTE) The clinical reportable range upper limit is being lowered to >1.10 to align with the FDA approved guidance for the current laboratory assay.  If heparin results are below expected values, and patient dosage has  been confirmed, suggest follow up testing of antithrombin III levels. Performed at [facility], [address] [place], [place]   Glucose, capillary     Status: Abnormal   Collection Time: 01/23/21  6:10 AM  Result Value Ref Range   Glucose-Capillary 56 (L) 70 - 99 mg/dL    Comment: Glucose reference range applies only to samples taken after fasting for at least 8 hours.  Glucose, capillary     Status: Abnormal   Collection Time: 01/23/21  6:21 AM  Result Value Ref Range   Glucose-Capillary 111 (H) 70 -  99 mg/dL    Comment: Glucose reference range applies only to samples taken after fasting for at least 8 hours.   Comment 1 Document in Chart   Glucose, capillary     Status: None   Collection Time: 01/23/21 10:39 AM  Result Value Ref Range   Glucose-Capillary 96 70 - 99 mg/dL    Comment: Glucose reference range applies only to samples taken after fasting for at least 8 hours.  CBC     Status: Abnormal    Collection Time: 01/23/21  2:49 PM  Result Value Ref Range   WBC 11.0 (H) 4.0 - 10.5 K/uL   RBC 2.33 (L) 3.87 - 5.11 MIL/uL   Hemoglobin 7.1 (L) 12.0 - 15.0 g/dL   HCT 71.2 (L) 45.8 - 09.9 %   MCV 94.4 80.0 - 100.0 fL   MCH 30.5 26.0 - 34.0 pg   MCHC 32.3 30.0 - 36.0 g/dL   RDW 83.3 (H) 82.5 - 05.3 %   Platelets 203 150 - 400 K/uL   nRBC 0.0 0.0 - 0.2 %    Comment: Performed at [facility], [address] [place], [place] [address]  APTT     Status: Abnormal   Collection Time: 01/23/21  2:49 PM  Result Value Ref Range   aPTT 139 (H) 24 - 36 seconds    Comment:        IF BASELINE aPTT IS ELEVATED, SUGGEST PATIENT RISK ASSESSMENT BE USED TO DETERMINE APPROPRIATE ANTICOAGULANT THERAPY. Performed at [facility], [address] [place], [place] [address]   Comprehensive metabolic panel     Status: Abnormal   Collection Time: 01/23/21  2:49 PM  Result Value Ref Range   Sodium 136 135 - 145 mmol/L   Potassium 3.8 3.5 - 5.1 mmol/L   Chloride 108 98 - 111 mmol/L   CO2 23 22 - 32 mmol/L   Glucose, Bld 134 (H) 70 - 99 mg/dL    Comment: Glucose reference range applies only to samples taken after fasting for at least 8 hours.   BUN 20 6 - 20 mg/dL   Creatinine, Ser 9.02 (H) 0.44 - 1.00 mg/dL   Calcium 8.0 (L) 8.9 - 10.3 mg/dL   Total Protein 4.5 (L) 6.5 - 8.1 g/dL   Albumin 2.3 (L) 3.5 - 5.0 g/dL   AST 54 (H) 15 - 41 U/L   ALT 58 (H) 0 - 44 U/L   Alkaline Phosphatase 78 38 - 126 U/L   Total Bilirubin 0.6 0.3 - 1.2 mg/dL   GFR, Estimated >40 >97 mL/min    Comment: (NOTE) Calculated using the CKD-EPI Creatinine Equation (2021)    Anion gap 5 5 - 15    Comment: Performed at [facility], [address] [place], [place] [address]  Type and screen [facility]     Status: None   Collection Time: 01/23/21  3:02 PM  Result Value Ref Range   ABO/RH(D) A POS    Antibody Screen NEG    Sample Expiration      01/26/2021,2359 Performed at [facility], [address], [place], [place] [address]   Glucose, capillary     Status: Abnormal   Collection Time: 01/23/21  3:57 PM  Result Value Ref Range   Glucose-Capillary 117 (H) 70 - 99 mg/dL    Comment: Glucose reference range applies only to samples taken after fasting for at least 8 hours.  Glucose, capillary  Status: Abnormal   Collection Time: 01/24/21  5:06 AM  Result Value Ref Range   Glucose-Capillary 103 (H) 70 - 99 mg/dL    Comment: Glucose reference range applies only to samples taken after fasting for at least 8 hours.  Heparin level (unfractionated)     Status: None   Collection Time: 01/24/21  5:55 AM  Result Value Ref Range   Heparin Unfractionated 0.53 0.30 - 0.70 IU/mL    Comment: (NOTE) The clinical reportable range upper limit is being lowered to >1.10 to align with the FDA approved guidance for the current laboratory assay.  If heparin results are below expected values, and patient dosage has  been confirmed, suggest follow up testing of antithrombin III levels. Performed at [facility], [address], [place], [place] [address]   CBC     Status: Abnormal   Collection Time: 01/24/21  5:55 AM  Result Value Ref Range   WBC 9.1 4.0 - 10.5 K/uL   RBC 2.31 (L) 3.87 - 5.11 MIL/uL   Hemoglobin 7.2 (L) 12.0 - 15.0 g/dL   HCT 03.0 (L) 09.2 - 33.0 %   MCV 93.9 80.0 - 100.0 fL   MCH 31.2 26.0 - 34.0 pg   MCHC 33.2 30.0 - 36.0 g/dL   RDW 07.6 (H) 22.6 - 33.3 %   Platelets 171 150 - 400 K/uL   nRBC 0.2 0.0 - 0.2 %    Comment: Performed at [facility], [address] [place], [place] [address]  Basic metabolic panel     Status: Abnormal   Collection Time: 01/24/21  5:55 AM  Result Value Ref Range   Sodium 136 135 - 145 mmol/L   Potassium 3.2 (L) 3.5 - 5.1 mmol/L   Chloride 105 98 - 111 mmol/L   CO2 24 22 - 32 mmol/L   Glucose, Bld 207 (H) 70 - 99 mg/dL    Comment: Glucose reference range applies only to samples taken after fasting for at  least 8 hours.   BUN 20 6 - 20 mg/dL   Creatinine, Ser 5.63 (H) 0.44 - 1.00 mg/dL   Calcium 7.9 (L) 8.9 - 10.3 mg/dL   GFR, Estimated >89 >37 mL/min    Comment: (NOTE) Calculated using the CKD-EPI Creatinine Equation (2021)    Anion gap 7 5 - 15    Comment: Performed at [facility], [address] [place], [place] [address]  APTT     Status: Abnormal   Collection Time: 01/24/21  5:55 AM  Result Value Ref Range   aPTT 113 (H) 24 - 36 seconds    Comment:        IF BASELINE aPTT IS ELEVATED, SUGGEST PATIENT RISK ASSESSMENT BE USED TO DETERMINE APPROPRIATE ANTICOAGULANT THERAPY. Performed at [facility], [address] [place], [place] [address]     I have reviewed the patient's current medications.  ASSESSMENT: Active Problems:   Delivery with history of C-section   Anemia affecting pregnancy in second trimester   Acute kidney injury (HCC)   Hyperemesis   Pregnancy with 24 completed weeks gestation   DKA, type 1, not at goal [facility])   Starvation ketoacidosis   Acute deep vein thrombosis (DVT) of brachial vein of right upper extremity (HCC)   Acute respiratory failure with hypoxia (HCC)   Vitreous hemorrhage of right eye (HCC)   Gastroparesis due to DM (HCC)   Diabetic retinopathy (HCC)   Acute on chronic combined systolic  and diastolic CHF (congestive heart failure) (HCC)   Type 1 diabetes mellitus affecting pregnancy in second trimester, antepartum   HFrEF (heart failure with reduced ejection fraction) (HCC)   [redacted] weeks gestation of pregnancy   Acute respiratory distress   Anemia during pregnancy   Pre-existing type 1 diabetes mellitus during pregnancy in second trimester   Pregnancy with 28 completed weeks gestation   History of preterm delivery, currently pregnant   Leg edema   Nasogastric tube present   Acute esophagitis   Pregnancy with 29 completed weeks gestation   PLAN: 1)  Ob care: weekly BPP, growth q 4 weeks Pt is s/p BMZ  2. GI N/V/  esophagitis    Pt tolerating regular diet    Pt has oral zofran, but the rest of the antiemetics are still IV  3) Endo     Type 1 DM, Pt I on endotool currently for insulin needs      Adrenal insufficiency.  Pt is currently on steroids and will need stress dose steroids at delivery  4) GU    Urine output is adequate  5) Heme       Hgb still low, but is stable from previous      Pt continues with heparin drip 6) Cards    Pt is on po hydralazine 50 mg  q 8 hours  Pt to have repeat cesarean section  with bilateral tubal ligation at 34 weeks. Medicaid consents signed.     Continue routine antenatal care.   Mariel Aloe, MD [facility] Faculty Attending, [facility] 01/24/2021 4:53 PM

## 2021-01-24 NOTE — Progress Notes (Signed)
PROGRESS NOTE  DORSEY CHARETTE  ZOX:[id number] DOB: Oct 05, 1990 DOA: 12/16/2020 PCP: Hildred Laser, MD   Brief Narrative: Ashley Camacho is a 30 y.o. G2P0101 with a history of T1DM, chronic HFrEF who was admitted initially 6/30 to [facility] for nausea and vomiting subsequently transferred to [facility] for rhinovirus pneumonia, discharged 7/20 only to return 7/22 with starvation ketosis and brachial vein DVT. TPN thru PICC was started and ultimately the patient was transferred to [facility] for cortrack placement on 7/26. Further complications detailed below:  7/27 cardiology consulted for cardiomyopathy, PICC line inserted, tube feeding started. 7/29 having recurrent retention, Foley catheter inserted 7/30 PRBC transfused 8/5 PRBC transfused 8/16 cardiology signed off, no follow-up recommended 8/19 nephrology consulted, no follow-up recommended 8/20 PRBC transfused 8/21 rescue course of betamethasone 8/22 Foley catheter reinserted after removal due to retention. 8/23 GI consulted. MFM consulted, nephrology signed off. 8/24 underwent EGD shows severe esophagitis with mild oozing.   Dobbhoff removed patient on oral diet. 8/25 PICC line removed. 8/26 started on steroids for adrenal insufficiency. 8/27 2 episodes of hematemesis.  Started on PPI drip.  GI reconsulted.  Lovenox held. 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 8/30 PICC line reinserted due to poor IV access, difficulty with blood draws and need for IV heparin.  Assessment & Plan: Active Problems:   Delivery with history of C-section   Anemia affecting pregnancy in second trimester   Acute kidney injury (HCC)   Hyperemesis   Pregnancy with 24 completed weeks gestation   DKA, type 1, not at goal [facility])   Starvation ketoacidosis   Acute deep vein thrombosis (DVT) of brachial vein of right upper extremity (HCC)   Acute respiratory failure with hypoxia (HCC)   Vitreous hemorrhage of right eye  (HCC)   Gastroparesis due to DM (HCC)   Diabetic retinopathy (HCC)   Acute on chronic combined systolic and diastolic CHF (congestive heart failure) (HCC)   Type 1 diabetes mellitus affecting pregnancy in second trimester, antepartum   HFrEF (heart failure with reduced ejection fraction) (HCC)   [redacted] weeks gestation of pregnancy   Acute respiratory distress   Anemia during pregnancy   Pre-existing type 1 diabetes mellitus during pregnancy in second trimester   Pregnancy with 28 completed weeks gestation   History of preterm delivery, currently pregnant   Leg edema   Nasogastric tube present   Acute esophagitis   Pregnancy with 29 completed weeks gestation  Hematemesis due to grade C esophagitis: Noted on EGD 8/24.  - Continue PPI IV, pepcid IV, and carafate.  - Continue soft/dysphagia diet as tolerated. GI not planning repeat endoscopic evaluation at this time.    Hyperemesis, intractable nausea and vomiting, gastroparesis:  - Continue scheduled reglan and prn zofran - Cortrack removed 8/24. Calorie count indicates she's currently meeting 50-60% of estimated needs with per oral intake alone inclusive of glucerna supplements. I've encouraged her to push per oral intake as she is not currently limited by nausea or vomiting, in hopes that we will avoid cortrack/TPN.    Acute blood loss anemia on anemia of pregnancy:  - Trend H/H with transfusion threshold of 7g/dl (unless otherwise recommended per OB). Has received 5 total units PRBCs during this admission.   Adrenal insufficiency: ACTH stim test diagnostic.  - Continue hydrocortisone taper as ordered. Plan eventual dosing 20mg  qAM, 10mg  qPM.  Right brachial vein DVT: Dx 7/17. Seems to be recannulating based on repeat U/S.  - Continue therapeutic  anticoagulation with heparin. Plan to convert back to lovenox if no further hematemesis, and stable hgb on 9/5  T1DM with gastroparesis and peripheral neuropathy: Brittle control due to  sensitivity and inconsistent calorie inputs. HbA1c was 5.8% 11/21/2020 (impacted by mid-late pregnancy).  - Continue insulin gtt and dextrose-containing IVF to prevent ketosis while po intake is minimal. Hope to transition back to insulin pump soon as her intake is becoming more reliable and her mentation is clearing. With hypoglycemia this morning, will not initiate transition at this time. The sensitivity she's exhibiting and lows make transitioning to basal-bolus insulin unwise.  Anasarca due to severe malnutrition related to hyperemesis as above as well as acute on chronic HFrEF, HTN: Updated echo showed improved LVEF 45% now up to 50-55%. RV and pulmonary artery pressures wnl. Small pericardial effusion noted without tamponade.  - Continue to suspect malnutrition/hypoalbuminemia is driver of edema. With elevated BP, will attempt diuresis with lasix alone on 9/3 which should be assisted by decreased IVF volume (600cc/day roughly). ~2L UOP over previous 24 hours though this is essentially net zero.  - Increase hydralazine dose with persistent BP elevation.  Acute urinary retention: Right hydronephrosis is likely physiologic in this setting.  - Voiding trial.   AKI: Improved. - SCr stable with diuresis at this time.  IUP: [redacted]w[redacted]d  - MFM recommends rLTCS at 34 weeks (9/20). Continue NST weekly (qMonday). - Pt wondering if she will be getting regular ultrasounds. - Has received BMZ - OB co-managing   UTI: Treated with amoxicillin 8/16 - 8/22  Limited venous access:  - PICC reinserted 8/30.  DVT prophylaxis: Heparin gtt Code Status: Full Family Communication: None at bedside Disposition Plan:  Status is: Inpatient  Remains inpatient appropriate because:IV treatments appropriate due to intensity of illness or inability to take PO  Dispo: The patient is from: Home              Anticipated d/c is to: Home              Patient currently is not medically stable to d/c.  Consultants:   OB/GYN Nephrology Cardiology GI  Subjective: Getting up with mild dyspnea which is stable, no chest pain. Leg swelling unchanged, though she's urinating subjectively a lot. Ate no lunch, had wrong tray for dinner, ended up eating a [place] sandwich. She is limited mostly by appetite, denies nausea or vomiting or any bleeding.   Objective: BP (!) 149/63 (BP Location: Left Arm)   Pulse 82   Temp 98 F (36.7 C) (Oral)   Resp 16   Ht 5\' 1"  (1.549 m)   Wt 81.7 kg   LMP  (LMP Unknown)   SpO2 98%   BMI 34.03 kg/m   Gen: 30 y.o. female in no distress Pulm: Nonlabored breathing room air. Clear. CV: Regular rate and rhythm. No murmur, rub, or gallop. No JVD, stable 2+ diffuse and pitting as well as dependent edema. GI: Abdomen soft, gravid, non-tender, non-distended, with normoactive bowel sounds.  Ext: Warm, no deformities Skin: No rashes, lesions or ulcers on visualized skin. Neuro: Alert and oriented. No focal neurological deficits. Psych: Judgement and insight appear fair. Mood euthymic & affect congruent. Behavior is appropriate.     LOS: 39 days   Time spent: 35 minutes.  37, MD [facility] [email or web address] 01/24/2021, 8:37 AM

## 2021-01-25 LAB — GLUCOSE, CAPILLARY: Glucose-Capillary: 107 mg/dL — ABNORMAL HIGH (ref 70–99)

## 2021-01-25 LAB — HEPARIN LEVEL (UNFRACTIONATED): Heparin Unfractionated: 0.5 IU/mL (ref 0.30–0.70)

## 2021-01-25 LAB — BASIC METABOLIC PANEL
Anion gap: 5 (ref 5–15)
BUN: 18 mg/dL (ref 6–20)
CO2: 24 mmol/L (ref 22–32)
Calcium: 7.9 mg/dL — ABNORMAL LOW (ref 8.9–10.3)
Chloride: 103 mmol/L (ref 98–111)
Creatinine, Ser: 0.98 mg/dL (ref 0.44–1.00)
GFR, Estimated: >60 mL/min (ref >60)
Glucose, Bld: 357 mg/dL — ABNORMAL HIGH (ref 70–99)
Potassium: 3.7 mmol/L (ref 3.5–5.1)
Sodium: 132 mmol/L — ABNORMAL LOW (ref 135–145)

## 2021-01-25 LAB — MAGNESIUM: Magnesium: 1.7 mg/dL (ref 1.7–2.4)

## 2021-01-25 MED ORDER — MAGNESIUM SULFATE 2 GM/50ML IV SOLN
2.0000 g | Freq: Once | INTRAVENOUS | Status: AC
  Administered 2021-01-25: 2 g via INTRAVENOUS
  Filled 2021-01-25: qty 50

## 2021-01-25 MED ORDER — POTASSIUM CHLORIDE CRYS ER 20 MEQ PO TBCR: 40.0000 meq | EXTENDED_RELEASE_TABLET | Freq: Once | ORAL | Status: DC

## 2021-01-25 MED ORDER — POTASSIUM CHLORIDE CRYS ER 20 MEQ PO TBCR
40.0000 meq | EXTENDED_RELEASE_TABLET | Freq: Two times a day (BID) | ORAL | Status: AC
  Administered 2021-01-25 (×2): 40 meq via ORAL
  Filled 2021-01-25 (×2): qty 2

## 2021-01-25 MED ORDER — MAGNESIUM OXIDE -MG SUPPLEMENT 400 (240 MG) MG PO TABS: 400.0000 mg | ORAL_TABLET | Freq: Once | ORAL | Status: DC

## 2021-01-25 MED ORDER — FUROSEMIDE 10 MG/ML IJ SOLN
40.0000 mg | Freq: Once | INTRAMUSCULAR | Status: AC
  Administered 2021-01-25: 40 mg via INTRAVENOUS
  Filled 2021-01-25: qty 4

## 2021-01-25 NOTE — Progress Notes (Addendum)
PROGRESS NOTE  QUINCY PRISCO  UUV:[id number] DOB: Jan 30, 1991 DOA: 12/16/2020 PCP: Hildred Laser, MD   Brief Narrative: Ashley Camacho is a 30 y.o. G2P0101 with a history of T1DM, chronic HFrEF who was admitted initially 6/30 to [facility] for nausea and vomiting subsequently transferred to [facility] for rhinovirus pneumonia, discharged 7/20 only to return 7/22 with starvation ketosis and brachial vein DVT. TPN thru PICC was started and ultimately the patient was transferred to [facility] for cortrack placement on 7/26. Further complications detailed below:  7/27 cardiology consulted for cardiomyopathy, PICC line inserted, tube feeding started. 7/29 having recurrent retention, Foley catheter inserted 7/30 PRBC transfused 8/5 PRBC transfused 8/16 cardiology signed off, no follow-up recommended 8/19 nephrology consulted, no follow-up recommended 8/20 PRBC transfused 8/21 rescue course of betamethasone 8/22 Foley catheter reinserted after removal due to retention. 8/23 GI consulted. MFM consulted, nephrology signed off. 8/24 underwent EGD shows severe esophagitis with mild oozing.   Dobbhoff removed patient on oral diet. 8/25 PICC line removed. 8/26 started on steroids for adrenal insufficiency. 8/27 2 episodes of hematemesis.  Started on PPI drip.  GI reconsulted.  Lovenox held. 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 8/30 PICC line reinserted due to poor IV access, difficulty with blood draws and need for IV heparin.  Assessment & Plan: Active Problems:   Delivery with history of C-section   Anemia affecting pregnancy in second trimester   Acute kidney injury (HCC)   Hyperemesis   Pregnancy with 24 completed weeks gestation   DKA, type 1, not at goal [facility])   Starvation ketoacidosis   Acute deep vein thrombosis (DVT) of brachial vein of right upper extremity (HCC)   Acute respiratory failure with hypoxia (HCC)   Vitreous hemorrhage of right eye  (HCC)   Gastroparesis due to DM (HCC)   Diabetic retinopathy (HCC)   Acute on chronic combined systolic and diastolic CHF (congestive heart failure) (HCC)   Type 1 diabetes mellitus affecting pregnancy in second trimester, antepartum   HFrEF (heart failure with reduced ejection fraction) (HCC)   [redacted] weeks gestation of pregnancy   Acute respiratory distress   Anemia during pregnancy   Pre-existing type 1 diabetes mellitus during pregnancy in second trimester   Pregnancy with 28 completed weeks gestation   History of preterm delivery, currently pregnant   Leg edema   Nasogastric tube present   Acute esophagitis   Pregnancy with 29 completed weeks gestation  Hematemesis due to grade C esophagitis: Noted on EGD 8/24.  - Continue PPI IV, pepcid IV, and carafate. Has resolved for several days. - Continue soft/dysphagia diet as tolerated. GI not planning repeat endoscopic evaluation at this time.    Hyperemesis, intractable nausea and vomiting, gastroparesis:  - Continue scheduled reglan and prn zofran - Cortrack removed 8/24. Calorie count indicates she was meeting 50-60% of estimated needs with per oral intake alone inclusive of glucerna supplements. I've encouraged her to push per oral intake as she is not currently limited by nausea or vomiting, in hopes that we will avoid cortrack/TPN. Continues gradual improvement.  Acute blood loss anemia on anemia of pregnancy:  - Trend H/H with transfusion threshold of 7g/dl (unless otherwise recommended per OB). Has received 5 total units PRBCs during this admission.   Adrenal insufficiency: ACTH stim test diagnostic.  - Continue hydrocortisone taper as ordered. Plan eventual dosing 20mg  qAM, 10mg  qPM.  Right brachial vein DVT: Dx 7/17. Seems to be recannulating based on repeat  U/S.  - Continue therapeutic anticoagulation with heparin. Plan to convert back to lovenox if no further hematemesis, and stable hgb on 9/5  T1DM with gastroparesis and  peripheral neuropathy: Brittle control due to sensitivity and inconsistent calorie inputs. HbA1c was 5.8% 11/21/2020 (impacted by mid-late pregnancy).  - Continue insulin gtt and dextrose-containing IVF to prevent ketosis while po intake is minimal. Hope to transition back to insulin pump soon as her intake is becoming more reliable and her mentation is clearing. With hypoglycemia this morning, will not initiate transition at this time. The sensitivity she's exhibiting and lows make transitioning to basal-bolus insulin unwise.  Anasarca due to severe malnutrition related to hyperemesis as above as well as acute on chronic HFrEF, HTN: Updated echo showed improved LVEF 45% now up to 50-55%. RV and pulmonary artery pressures wnl. Small pericardial effusion noted without tamponade.  - Continue lasix 40mg  IV daily, supp K and Mg as well, monitoring I/O, weights. >3L UOP over past 24 hours with decreased fluid intake. No clinical difference at this time. Cr stable.  HTN: - Increased hydralazine dose with persistent BP elevation.  Acute urinary retention: Right hydronephrosis is likely physiologic in this setting.  - Voiding without difficulty at this time.  AKI: Improved. - SCr remains stable with diuresis at this time.  IUP:  - MFM recommends rLTCS at 34 weeks (9/20). Continue NSTs/BPP/serial growth scans. Leaving orders in this regard to MFM/OB. NST on 9/4 was reactive, reassuring. - Has received BMZ - OB co-managing   UTI: Treated with amoxicillin 8/16 - 8/22  Limited venous access:  - PICC reinserted 8/30.  DVT prophylaxis: Heparin gtt Code Status: Full Family Communication: Boyfriend's godmother at bedside Disposition Plan:  Status is: Inpatient  Remains inpatient appropriate because:IV treatments appropriate due to intensity of illness or inability to take PO  Dispo: The patient is from: Home              Anticipated d/c is to: Home              Patient currently is not medically  stable to d/c.  Consultants:  OB/GYN Nephrology Cardiology GI  Subjective: Starting to eat better, typically eats most/all of breakfast and dinner. Not usually hungry at lunch. Urged to eat more overall. No pain, no bleeding, no lightheadedness or standing, no dyspnea at rest. Lots of UOP subjectively.  Objective: BP (!) 141/65 (BP Location: Right Leg)   Pulse 87   Temp 97.7 F (36.5 C) (Oral)   Resp 18   Ht 5\' 1"  (1.549 m)   Wt 81.7 kg   LMP  (LMP Unknown)   SpO2 99%   BMI 34.03 kg/m   Gen: 30 y.o. female in no distress Pulm: Nonlabored breathing room air. Clear. CV: Regular rate and rhythm. No murmur, rub, or gallop. No JVD, stable 3+ pitting dependent edema. GI: Abdomen soft, non-tender, gravid w/bowel sounds.  Ext: Warm, no deformities Skin: No new rashes, lesions or ulcers on visualized skin. Appears pale. Neuro: Alert and oriented. No focal neurological deficits. Psych: Judgement and insight appear fair. Mood euthymic & affect congruent. Behavior is appropriate.     LOS: 40 days   Time spent: 35 minutes.  , MD [facility] [email or web address] 01/25/2021, 1:28 PM

## 2021-01-25 NOTE — Progress Notes (Signed)
Patient ID: Ashley Camacho, female   DOB: [birth year], 30 y.o.   MRN: [id number] [facility] ANTEPARTUM COMPREHENSIVE PROGRESS NOTE  Ashley Camacho is a 30 y.o. G2P0101 at [redacted]w[redacted]d  who is admitted for DM1 complications in the setting if 30 6/7 weeks IUP. in the setting if 30 6/7 weeks IUP.   Fetal presentation is cephalic. Length of Stay:  40  Days  Subjective: Pt reports feeling better. Tolerating diet. N/V improved Patient reports good fetal movement.  She reports no uterine contractions, no bleeding and no loss of fluid per vagina.  Vitals:  Blood pressure (!) 141/65, pulse 87, temperature 97.7 F (36.5 C), temperature source Oral, resp. rate 18, height 5\' 1"  (1.549 m), weight 81.7 kg, SpO2 99 %. Physical Examination: Lungs clear Heart RRR Abd soft + BS gravid   Fetal Monitoring:  Baseline: 130 bpm, Variability: Good {> 6 bpm), and Accelerations: Reactive  Labs:  Results for orders placed or performed during the hospital encounter of 12/16/20 (from the past 24 hour(s))  Glucose, capillary   Collection Time: 01/25/21  5:54 AM  Result Value Ref Range   Glucose-Capillary 107 (H) 70 - 99 mg/dL  Heparin level (unfractionated)   Collection Time: 01/25/21  6:30 AM  Result Value Ref Range   Heparin Unfractionated 0.50 0.30 - 0.70 IU/mL  Basic metabolic panel   Collection Time: 01/25/21  6:30 AM  Result Value Ref Range   Sodium 132 (L) 135 - 145 mmol/L   Potassium 3.7 3.5 - 5.1 mmol/L   Chloride 103 98 - 111 mmol/L   CO2 24 22 - 32 mmol/L   Glucose, Bld 357 (H) 70 - 99 mg/dL   BUN 18 6 - 20 mg/dL   Creatinine, Ser 03/27/21 0.44 - 1.00 mg/dL   Calcium 7.9 (L) 8.9 - 10.3 mg/dL   GFR, Estimated 0.24 >09 mL/min   Anion gap 5 5 - 15  Magnesium   Collection Time: 01/25/21  6:30 AM  Result Value Ref Range   Magnesium 1.7 1.7 - 2.4 mg/dL    Imaging Studies:    BPP 8/9 Growth 28 % 01/12/21   Medications:  Scheduled  Chlorhexidine Gluconate Cloth  6 each Topical Daily   cholecalciferol  1,000 Units Oral Daily    vitamin B-6  12.5 mg Oral BID   And   doxylamine (Sleep)  12.5 mg Oral BID   DULoxetine  20 mg Oral Daily   famotidine  20 mg Oral BID   feeding supplement (GLUCERNA SHAKE)  237 mL Oral BID BM   hydrALAZINE  50 mg Oral Q8H   hydrocortisone sod succinate (SOLU-CORTEF) inj  50 mg Intravenous Daily   Followed by   01/14/21 ON 01/27/2021] hydrocortisone sod succinate (SOLU-CORTEF) inj  40 mg Intravenous Daily   mouth rinse  15 mL Mouth Rinse q12n4p   metoCLOPramide (REGLAN) injection  5 mg Intravenous Q6H   pantoprazole  40 mg Intravenous Q12H   potassium chloride  40 mEq Oral BID   prenatal vitamin w/FE, FA  1 tablet Oral Q1200   sodium chloride  1 spray Each Nare Q2H   sodium chloride flush  10-40 mL Intracatheter Q12H   sucralfate  1 g Oral TID WC & HS   vitamin B-12  1,000 mcg Oral Daily   I have reviewed the patient's current medications.  ASSESSMENT: IUP 30 6/7 weeks Unwanted fertility Multiple medical problems   PLAN: Stable from an OB stand point. Fetal well being reassuring. Weekly BPP. Serial growth scans. Delivery at 34 0/7 weeks  via c section with BTL unless indicated prior. Medial problems managed hy hospital team and greatly appreciated Continue routine antenatal care.   Hermina Staggers 01/25/2021,12:38 PM

## 2021-01-26 DIAGNOSIS — Z3A31 31 weeks gestation of pregnancy: Secondary | ICD-10-CM

## 2021-01-26 LAB — BASIC METABOLIC PANEL
Anion gap: 12 (ref 5–15)
BUN: 20 mg/dL (ref 6–20)
CO2: 24 mmol/L (ref 22–32)
Calcium: 8.8 mg/dL — ABNORMAL LOW (ref 8.9–10.3)
Chloride: 98 mmol/L (ref 98–111)
Creatinine, Ser: 1.12 mg/dL — ABNORMAL HIGH (ref 0.44–1.00)
GFR, Estimated: >60 mL/min (ref >60)
Glucose, Bld: 238 mg/dL — ABNORMAL HIGH (ref 70–99)
Potassium: 4 mmol/L (ref 3.5–5.1)
Sodium: 134 mmol/L — ABNORMAL LOW (ref 135–145)

## 2021-01-26 LAB — HEMOGLOBIN AND HEMATOCRIT, BLOOD
HCT: 21.3 % — ABNORMAL LOW (ref 36.0–46.0)
Hemoglobin: 6.7 g/dL — CL (ref 12.0–15.0)

## 2021-01-26 LAB — HEPARIN LEVEL (UNFRACTIONATED): Heparin Unfractionated: 0.59 IU/mL (ref 0.30–0.70)

## 2021-01-26 LAB — PREPARE RBC (CROSSMATCH)

## 2021-01-26 LAB — GLUCOSE, CAPILLARY: Glucose-Capillary: 95 mg/dL (ref 70–99)

## 2021-01-26 MED ORDER — SODIUM CHLORIDE 0.9% IV SOLUTION: Freq: Once | INTRAVENOUS | Status: DC

## 2021-01-26 MED ORDER — NIFEDIPINE 10 MG PO CAPS
10.0000 mg | ORAL_CAPSULE | ORAL | Status: AC | PRN
  Administered 2021-01-26 – 2021-01-27 (×2): 10 mg via ORAL
  Filled 2021-01-26 (×2): qty 1

## 2021-01-26 MED ORDER — INSULIN ASPART 100 UNIT/ML IJ SOLN
3.0000 [IU] | Freq: Once | INTRAMUSCULAR | Status: AC
  Administered 2021-01-26: 3 [IU] via SUBCUTANEOUS

## 2021-01-26 MED ORDER — HYDRALAZINE HCL 50 MG PO TABS
75.0000 mg | ORAL_TABLET | Freq: Three times a day (TID) | ORAL | Status: DC
  Administered 2021-01-26 – 2021-02-21 (×77): 75 mg via ORAL
  Filled 2021-01-26 (×80): qty 2

## 2021-01-26 MED ORDER — FUROSEMIDE 10 MG/ML IJ SOLN
40.0000 mg | Freq: Every day | INTRAMUSCULAR | Status: DC
  Administered 2021-01-26 – 2021-01-29 (×4): 40 mg via INTRAVENOUS
  Filled 2021-01-26 (×5): qty 4

## 2021-01-26 MED ORDER — INSULIN ASPART 100 UNIT/ML IJ SOLN
2.0000 [IU] | Freq: Once | INTRAMUSCULAR | Status: AC
  Administered 2021-01-26: 2 [IU] via SUBCUTANEOUS

## 2021-01-26 NOTE — Progress Notes (Signed)
Date and time results received: 01/26/21 0720   Test: HGB  Critical Value: 6.7  Name of Provider Notified: Ervin  Orders Received? Or Actions Taken? 2 units PRBC ordered.

## 2021-01-26 NOTE — Progress Notes (Signed)
ANTICOAGULATION CONSULT NOTE - Follow Up Consult  Pharmacy Consult for heparin Indication:  right brachial VTE  No Known Allergies  Patient Measurements: Height: 5\' 1"  (154.9 cm) Weight: 80.6 kg (177 lb 9.6 oz) IBW/kg (Calculated) : 47.8 Heparin Dosing Weight: 63.4kg  Vital Signs: Temp: 97.5 F (36.4 C) (09/05 1542) Temp Source: Oral (09/05 1542) BP: 154/66 (09/05 1542) Pulse Rate: 95 (09/05 1542)  Labs: Recent Labs    01/24/21 0555 01/25/21 0630 01/26/21 0600  HGB 7.2*  --  6.7*  HCT 21.7*  --  21.3*  PLT 171  --   --   APTT 113*  --   --   HEPARINUNFRC 0.53 0.50 0.59  CREATININE 1.07* 0.98 1.12*     Estimated Creatinine Clearance: 71.3 mL/min (A) (by C-G formula based on SCr of 1.12 mg/dL (H)).   Medications:  Scheduled:   sodium chloride   Intravenous Once   Chlorhexidine Gluconate Cloth  6 each Topical Daily   cholecalciferol  1,000 Units Oral Daily   vitamin B-6  12.5 mg Oral BID   And   doxylamine (Sleep)  12.5 mg Oral BID   DULoxetine  20 mg Oral Daily   famotidine  20 mg Oral BID   feeding supplement (GLUCERNA SHAKE)  237 mL Oral BID BM   furosemide  40 mg Intravenous Daily   hydrALAZINE  75 mg Oral Q8H   [START ON 01/27/2021] hydrocortisone sod succinate (SOLU-CORTEF) inj  40 mg Intravenous Daily   mouth rinse  15 mL Mouth Rinse q12n4p   metoCLOPramide (REGLAN) injection  5 mg Intravenous Q6H   pantoprazole  40 mg Intravenous Q12H   prenatal vitamin w/FE, FA  1 tablet Oral Q1200   sodium chloride  1 spray Each Nare Q2H   sodium chloride flush  10-40 mL Intracatheter Q12H   sucralfate  1 g Oral TID WC & HS   vitamin B-12  1,000 mcg Oral Daily    Assessment: Heparin level reported as 0.59 units/ml this morning and is within therapeutic range.  Hgb 6.7 and received 2 units PRBC  Goal of Therapy:  Heparin level 0.3-0.7 units/ml Monitor platelets by anticoagulation protocol: Yes   Plan: Continue current Heparin rate of 1350 units/hr (13.5  ml/hr). Continue to monitor H&H and platelets Monitor s/sx bleeding   03/29/2021, PharmD, BCPPS 01/26/2021 4:09 PM

## 2021-01-26 NOTE — Progress Notes (Signed)
Spoke with Dr. Jarvis Newcomer in regards to patient needing blood transfusion.  -patient with double lumen PICC line -both ports infusing Insulin and Heparin -IV team requested for peripheral line  -IV team to bedside and they stated they could not do an IV due to her right brachial DVT and the other arm with the PICC -Instructed by Dr. Jarvis Newcomer to stop Insulin drip and restart once blood transfusion complete -Current blood sugar 134  Steffany Schoenfelder L Justun Anaya, RN

## 2021-01-26 NOTE — Progress Notes (Signed)
FACULTY PRACTICE ANTEPARTUM PROGRESS NOTE  Ashley Camacho is a 30 y.o. G2P0101 at [redacted]w[redacted]d who is admitted for hyperemesis, gastroparesis and type DM complications.  Estimated Date of Delivery: 03/30/21 Fetal presentation is cephalic.  Length of Stay:  41 Days. Admitted 12/16/2020  Subjective: Pt seen, doing well.  Currently receiving 2 units PRBC for anemia.  Pt states she ambulated yesterday.  She denies n/v vomiting and has been tolerating po nutrition.   Patient reports normal fetal movement.  She notes rare uterine contractions, denies bleeding and leaking of fluid per vagina.  Vitals:  Blood pressure (!) 141/59, pulse 88, temperature 98.2 F (36.8 C), temperature source Oral, resp. rate 16, height 5\' 1"  (1.549 m), weight 80.6 kg, SpO2 98 %. Physical Examination: CONSTITUTIONAL: Well-developed, well-nourished female in no acute distress.  HENT:  Normocephalic, atraumatic, External right and left ear normal. Oropharynx is clear and moist EYES: Conjunctivae and EOM are normal.  NEUROLGIC: Alert and oriented to person, place, and time. Normal reflexes, muscle tone coordination. No cranial nerve deficit noted. PSYCHIATRIC: Normal mood and affect. Normal behavior. Normal judgment and thought content. CARDIOVASCULAR: Normal heart rate noted, regular rhythm RESPIRATORY: Effort and breath sounds normal, no problems with respiration noted MUSCULOSKELETAL: Normal range of motion. No edema and no tenderness. ABDOMEN: Soft, nontender, nondistended, gravid. CERVIX: deferred  Fetal monitoring: pending  Results for orders placed or performed during the hospital encounter of 12/16/20 (from the past 48 hour(s))  Glucose, capillary     Status: Abnormal   Collection Time: 01/25/21  5:54 AM  Result Value Ref Range   Glucose-Capillary 107 (H) 70 - 99 mg/dL    Comment: Glucose reference range applies only to samples taken after fasting for at least 8 hours.  Heparin level (unfractionated)     Status:  None   Collection Time: 01/25/21  6:30 AM  Result Value Ref Range   Heparin Unfractionated 0.50 0.30 - 0.70 IU/mL    Comment: (NOTE) The clinical reportable range upper limit is being lowered to >1.10 to align with the FDA approved guidance for the current laboratory assay.  If heparin results are below expected values, and patient dosage has  been confirmed, suggest follow up testing of antithrombin III levels. Performed at [facility], [address], [place], [place]   Basic metabolic panel     Status: Abnormal   Collection Time: 01/25/21  6:30 AM  Result Value Ref Range   Sodium 132 (L) 135 - 145 mmol/L   Potassium 3.7 3.5 - 5.1 mmol/L   Chloride 103 98 - 111 mmol/L   CO2 24 22 - 32 mmol/L   Glucose, Bld 357 (H) 70 - 99 mg/dL    Comment: Glucose reference range applies only to samples taken after fasting for at least 8 hours.   BUN 18 6 - 20 mg/dL   Creatinine, Ser 03/27/21 0.44 - 1.00 mg/dL   Calcium 7.9 (L) 8.9 - 10.3 mg/dL   GFR, Estimated 4.74 >25 mL/min    Comment: (NOTE) Calculated using the CKD-EPI Creatinine Equation (2021)    Anion gap 5 5 - 15    Comment: Performed at [facility], [address] [place], [place]  Magnesium     Status: None   Collection Time: 01/25/21  6:30 AM  Result Value Ref Range   Magnesium 1.7 1.7 - 2.4 mg/dL    Comment: Performed at [facility], [address], [place], [place]  Glucose, capillary     Status: None   Collection Time: 01/26/21  5:31 AM  Result Value Ref Range   Glucose-Capillary 95 70 - 99 mg/dL    Comment: Glucose reference range applies only to samples taken after fasting for at least 8 hours.  Heparin level (unfractionated)     Status: None   Collection Time: 01/26/21  6:00 AM  Result Value Ref Range   Heparin Unfractionated 0.59 0.30 - 0.70 IU/mL    Comment: (NOTE) The clinical reportable range upper limit is being lowered to >1.10 to align with the FDA approved  guidance for the current laboratory assay.  If heparin results are below expected values, and patient dosage has  been confirmed, suggest follow up testing of antithrombin III levels. Performed at [facility], [address], [place], [place] [address]   Hemoglobin and hematocrit, blood     Status: Abnormal   Collection Time: 01/26/21  6:00 AM  Result Value Ref Range   Hemoglobin 6.7 (LL) 12.0 - 15.0 g/dL    Comment: REPEATED TO VERIFY THIS CRITICAL RESULT HAS VERIFIED AND BEEN CALLED TO A. NORRIS, RN BY LEAH KLAR ON 09 05 2022 AT 0713, AND HAS BEEN READ BACK.     HCT 21.3 (L) 36.0 - 46.0 %    Comment: Performed at [facility], [address], [place], [place] [address]  Basic metabolic panel     Status: Abnormal   Collection Time: 01/26/21  6:00 AM  Result Value Ref Range   Sodium 134 (L) 135 - 145 mmol/L   Potassium 4.0 3.5 - 5.1 mmol/L   Chloride 98 98 - 111 mmol/L   CO2 24 22 - 32 mmol/L   Glucose, Bld 238 (H) 70 - 99 mg/dL    Comment: Glucose reference range applies only to samples taken after fasting for at least 8 hours.   BUN 20 6 - 20 mg/dL   Creatinine, Ser 8.03 (H) 0.44 - 1.00 mg/dL   Calcium 8.8 (L) 8.9 - 10.3 mg/dL   GFR, Estimated >21 >22 mL/min    Comment: (NOTE) Calculated using the CKD-EPI Creatinine Equation (2021)    Anion gap 12 5 - 15    Comment: Performed at [facility], [address], [place], [place] [address]  Type and screen [facility]     Status: None (Preliminary result)   Collection Time: 01/26/21  6:07 AM  Result Value Ref Range   ABO/RH(D) A POS    Antibody Screen NEG    Sample Expiration 01/29/2021,2359    Unit Number [id number]    Blood Component Type RED CELLS,LR    Unit division 00    Status of Unit ISSUED    Transfusion Status OK TO TRANSFUSE    Crossmatch Result      Compatible Performed at [facility], [address], [place], [place] [address]    Unit Number [id number]     Blood Component Type RED CELLS,LR    Unit division 00    Status of Unit ALLOCATED    Transfusion Status OK TO TRANSFUSE    Crossmatch Result Compatible   Prepare RBC (crossmatch)     Status: None   Collection Time: 01/26/21  7:23 AM  Result Value Ref Range   Order Confirmation      ORDER PROCESSED BY BLOOD BANK Performed at [facility], [address], [place], [place] [address]     I  have reviewed the patient's current medications.  ASSESSMENT: Active Problems:   Delivery with history of C-section   Anemia affecting pregnancy in second trimester   Acute kidney injury (HCC)   Hyperemesis   Pregnancy with 24 completed weeks gestation   DKA, type 1, not at goal [facility])   Starvation ketoacidosis   Acute deep vein thrombosis (DVT) of brachial vein of right upper extremity (HCC)   Acute respiratory failure with hypoxia (HCC)   Vitreous hemorrhage of right eye (HCC)   Gastroparesis due to DM (HCC)   Diabetic retinopathy (HCC)   Acute on chronic combined systolic and diastolic CHF (congestive heart failure) (HCC)   Type 1 diabetes mellitus affecting pregnancy in second trimester, antepartum   HFrEF (heart failure with reduced ejection fraction) (HCC)   [redacted] weeks gestation of pregnancy   Acute respiratory distress   Anemia during pregnancy   Pre-existing type 1 diabetes mellitus during pregnancy in second trimester   Pregnancy with 28 completed weeks gestation   History of preterm delivery, currently pregnant   Leg edema   Nasogastric tube present   Acute esophagitis   Pregnancy with 29 completed weeks gestation   PLAN: IUP at 31 weeks Unwanted fertility Multiple medical problems  Fetal testing pending for today Weekly BPP Plan for delivery at 34 weeks with repeat c/s and BTL Recheck h/h s/p blood transfusion   Continue routine antenatal care.   Mariel Aloe, MD [facility] Faculty Attending, Center for [facility] 01/26/2021 10:34 AM

## 2021-01-26 NOTE — Progress Notes (Signed)
Pt reporting stomach pains at the bottom of her stomach. Abdomen hard on palpation. Toco reapplied. Contractions appear to be 2-3 minutes apart. Dr. Donavan Foil notified. Order received for 10mg  procardia. Will continue to monitor. , RN

## 2021-01-26 NOTE — Progress Notes (Signed)
22 PROGRESS NOTE  Ashley Camacho  ZOX:[id number] DOB: 06/28/1990 DOA: 12/16/2020 PCP: Hildred Laser, MD   Brief Narrative: Ashley Camacho is a 30 y.o. G2P0101 with a history of T1DM, chronic HFrEF who was admitted initially 6/30 to [facility] for nausea and vomiting subsequently transferred to [facility] for rhinovirus pneumonia, discharged 7/20 only to return 7/22 with starvation ketosis and brachial vein DVT. TPN thru PICC was started and ultimately the patient was transferred to [facility] for cortrack placement on 7/26. Further complications detailed below:  7/27 cardiology consulted for cardiomyopathy, PICC line inserted, tube feeding started. 7/29 having recurrent retention, Foley catheter inserted 7/30 PRBC transfused 8/5 PRBC transfused 8/16 cardiology signed off, no follow-up recommended 8/19 nephrology consulted, no follow-up recommended 8/20 PRBC transfused 8/21 rescue course of betamethasone 8/22 Foley catheter reinserted after removal due to retention. 8/23 GI consulted. MFM consulted, nephrology signed off. 8/24 underwent EGD shows severe esophagitis with mild oozing.   Dobbhoff removed patient on oral diet. 8/25 PICC line removed. 8/26 started on steroids for adrenal insufficiency. 8/27 2 episodes of hematemesis.  Started on PPI drip.  GI reconsulted.  Lovenox held. 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 8/30 PICC line reinserted due to poor IV access, difficulty with blood draws and need for IV heparin.  Assessment & Plan: Active Problems:   Delivery with history of C-section   Anemia affecting pregnancy in second trimester   Acute kidney injury (HCC)   Hyperemesis   Pregnancy with 24 completed weeks gestation   DKA, type 1, not at goal [facility])   Starvation ketoacidosis   Acute deep vein thrombosis (DVT) of brachial vein of right upper extremity (HCC)   Acute respiratory failure with hypoxia (HCC)   Vitreous hemorrhage of right eye  (HCC)   Gastroparesis due to DM (HCC)   Diabetic retinopathy (HCC)   Acute on chronic combined systolic and diastolic CHF (congestive heart failure) (HCC)   Type 1 diabetes mellitus affecting pregnancy in second trimester, antepartum   HFrEF (heart failure with reduced ejection fraction) (HCC)   [redacted] weeks gestation of pregnancy   Acute respiratory distress   Anemia during pregnancy   Pre-existing type 1 diabetes mellitus during pregnancy in second trimester   Pregnancy with 28 completed weeks gestation   History of preterm delivery, currently pregnant   Leg edema   Nasogastric tube present   Acute esophagitis   Pregnancy with 29 completed weeks gestation  Hematemesis due to grade C esophagitis: Noted on EGD 8/24.  - Continue PPI IV, pepcid IV, and carafate. Has resolved for several days. - Continue soft/dysphagia diet as tolerated. GI not planning repeat endoscopic evaluation at this time.    Hyperemesis, intractable nausea and vomiting, gastroparesis:  - Continue scheduled reglan and prn zofran - Cortrack removed 8/24. Calorie count indicates she was meeting 50-60% of estimated needs with per oral intake alone inclusive of glucerna supplements. I've encouraged her to push per oral intake as she is not currently limited by nausea or vomiting, in hopes that we will avoid cortrack/TPN. Continues gradual improvement.  Acute blood loss anemia on anemia of pregnancy:  - Transfuse RBCs for hgb <7 this AM. 2 units per OB. This brings total to 7u PRBCs during hospitalization   Adrenal insufficiency: ACTH stim test diagnostic.  - Continue hydrocortisone taper as ordered. Plan eventual dosing 20mg  qAM, 10mg  qPM.  Right brachial vein DVT: Dx 7/17. Seems to be recannulating based on repeat U/S.  -  Continue therapeutic anticoagulation with heparin. Since needing transfusion support on 9/5, will stay on heparin for now.  T1DM with gastroparesis and peripheral neuropathy: Brittle control due to  sensitivity and inconsistent calorie inputs. HbA1c was 5.8% 11/21/2020 (impacted by mid-late pregnancy).  - Continue insulin gtt and dextrose-containing IVF to prevent ketosis while po intake is minimal. Hope to transition back to insulin pump soon as her intake is becoming more reliable and her mentation is clearing. With hypoglycemia this morning, will not initiate transition at this time. The sensitivity she's exhibiting and lows make transitioning to basal-bolus insulin unwise. - While transfusing, will have to stop continuous insulin. Will plan to continue checking hourly and cover with novolog as indicated.  Anasarca due to severe malnutrition related to hyperemesis as above as well as acute on chronic HFrEF, HTN: Updated echo showed improved LVEF 45% now up to 50-55%. RV and pulmonary artery pressures wnl. Small pericardial effusion noted without tamponade.  - Continue lasix 40mg  IV daily, K is 4, will give 40 x1. Mg supplemented previously. Again with >3L UOP/24hrs. Hopeful that increased HCT will help bring volume intravascularly. May necessitate additional diuresis.   HTN: - Increased hydralazine dose with persistent BP elevation. Increase further today.   Acute urinary retention: Right hydronephrosis is likely physiologic in this setting.  - Voiding without difficulty at this time.  AKI: Improved. - SCr remains stable with diuresis at this time. Will monitor regularly.  IUP:  - MFM recommends rLTCS at 34 weeks (9/20). Continue NSTs/BPP/serial growth scans. Leaving orders in this regard to MFM/OB. NST on 9/4 was reactive, reassuring. - Has received BMZ - OB co-managing   UTI: Treated with amoxicillin 8/16 - 8/22  Limited venous access:  - PICC reinserted 8/30.  DVT prophylaxis: Heparin gtt Code Status: Full Family Communication: None at bedside Disposition Plan:  Status is: Inpatient  Remains inpatient appropriate because:IV treatments appropriate due to intensity of illness  or inability to take PO  Dispo: The patient is from: Home              Anticipated d/c is to: Home              Patient currently is not medically stable to d/c.  Consultants:  OB/GYN Nephrology Cardiology GI  Subjective: No gross bleeding noted, though hgb down this AM. Pt does not note any chest pain, dyspnea, or palpitations. Ate some breakfast this morning but just vomited it back up. No hematemesis. Feels tired now with lower abdominal pain that is minimal currently.   Objective: BP (!) 156/68   Pulse 86   Temp 98.5 F (36.9 C) (Oral)   Resp 18   Ht 5\' 1"  (1.549 m)   Wt 80.6 kg   LMP  (LMP Unknown)   SpO2 98%   BMI 33.56 kg/m   Gen: 30 y.o. female in no distress Pulm: Nonlabored breathing room air. Clear. CV: Regular rate and rhythm. No murmur, rub, or gallop. No JVD, stable 2+ dependent edema. GI: Abdomen soft, gravid, non-tender, non-distended, with normoactive bowel sounds.  Ext: Warm, no deformities Skin: No new rashes, lesions or ulcers on visualized skin. Pale. Neuro: Alert and oriented. No focal neurological deficits. Psych: Judgement and insight appear fair. Mood euthymic & affect congruent. Behavior is appropriate.    Lab Results  Component Value Date   HGB 6.7 (LL) 01/26/2021      LOS: 41 days   Time spent: 35 minutes.  37, MD [facility] [email or web address]  01/26/2021, 12:51 PM

## 2021-01-27 ENCOUNTER — Inpatient Hospital Stay (HOSPITAL_BASED_OUTPATIENT_CLINIC_OR_DEPARTMENT_OTHER): Payer: Medicaid Other

## 2021-01-27 DIAGNOSIS — O403XX0 Polyhydramnios, third trimester, not applicable or unspecified: Secondary | ICD-10-CM

## 2021-01-27 DIAGNOSIS — O24013 Pre-existing diabetes mellitus, type 1, in pregnancy, third trimester: Secondary | ICD-10-CM | POA: Diagnosis not present

## 2021-01-27 DIAGNOSIS — Z3A31 31 weeks gestation of pregnancy: Secondary | ICD-10-CM

## 2021-01-27 DIAGNOSIS — E109 Type 1 diabetes mellitus without complications: Secondary | ICD-10-CM | POA: Diagnosis not present

## 2021-01-27 LAB — TYPE AND SCREEN
ABO/RH(D): A POS
Antibody Screen: NEGATIVE
Unit division: 0
Unit division: 0

## 2021-01-27 LAB — HEPATIC FUNCTION PANEL
ALT: 82 U/L — ABNORMAL HIGH (ref 0–44)
AST: 59 U/L — ABNORMAL HIGH (ref 15–41)
Albumin: 2.4 g/dL — ABNORMAL LOW (ref 3.5–5.0)
Alkaline Phosphatase: 90 U/L (ref 38–126)
Bilirubin, Direct: 0.3 mg/dL — ABNORMAL HIGH (ref 0.0–0.2)
Indirect Bilirubin: 0.6 mg/dL (ref 0.3–0.9)
Total Bilirubin: 0.9 mg/dL (ref 0.3–1.2)
Total Protein: 4.8 g/dL — ABNORMAL LOW (ref 6.5–8.1)

## 2021-01-27 LAB — BASIC METABOLIC PANEL
Anion gap: 7 (ref 5–15)
BUN: 24 mg/dL — ABNORMAL HIGH (ref 6–20)
CO2: 24 mmol/L (ref 22–32)
Calcium: 8.3 mg/dL — ABNORMAL LOW (ref 8.9–10.3)
Chloride: 104 mmol/L (ref 98–111)
Creatinine, Ser: 1.14 mg/dL — ABNORMAL HIGH (ref 0.44–1.00)
GFR, Estimated: >60 mL/min (ref >60)
Glucose, Bld: 91 mg/dL (ref 70–99)
Potassium: 3.9 mmol/L (ref 3.5–5.1)
Sodium: 135 mmol/L (ref 135–145)

## 2021-01-27 LAB — BPAM RBC
Blood Product Expiration Date: 202209302359
Blood Product Expiration Date: 202210022359
ISSUE DATE / TIME: 202209050929
ISSUE DATE / TIME: 202209051214
Unit Type and Rh: 6200
Unit Type and Rh: 6200

## 2021-01-27 LAB — HEPARIN LEVEL (UNFRACTIONATED): Heparin Unfractionated: 0.5 IU/mL (ref 0.30–0.70)

## 2021-01-27 LAB — CBC
HCT: 26.9 % — ABNORMAL LOW (ref 36.0–46.0)
Hemoglobin: 9 g/dL — ABNORMAL LOW (ref 12.0–15.0)
MCH: 30.7 pg (ref 26.0–34.0)
MCHC: 33.5 g/dL (ref 30.0–36.0)
MCV: 91.8 fL (ref 80.0–100.0)
Platelets: 147 10*3/uL — ABNORMAL LOW (ref 150–400)
RBC: 2.93 MIL/uL — ABNORMAL LOW (ref 3.87–5.11)
RDW: 16.6 % — ABNORMAL HIGH (ref 11.5–15.5)
WBC: 10.1 10*3/uL (ref 4.0–10.5)
nRBC: 0 % (ref 0.0–0.2)

## 2021-01-27 LAB — GLUCOSE, CAPILLARY: Glucose-Capillary: 105 mg/dL — ABNORMAL HIGH (ref 70–99)

## 2021-01-27 MED ORDER — NIFEDIPINE 10 MG PO CAPS
10.0000 mg | ORAL_CAPSULE | Freq: Four times a day (QID) | ORAL | Status: DC | PRN
  Administered 2021-01-27 – 2021-02-15 (×6): 10 mg via ORAL
  Filled 2021-01-27 (×6): qty 1

## 2021-01-27 NOTE — Progress Notes (Signed)
ANTICOAGULATION CONSULT NOTE - Follow Up Consult  Pharmacy Consult for heparin Indication:  right brachial VTE  No Known Allergies  Patient Measurements: Height: 5\' 1"  (154.9 cm) Weight: 80.6 kg (177 lb 9.6 oz) IBW/kg (Calculated) : 47.8 Heparin Dosing Weight: 63.4kg  Vital Signs: Temp: 97.8 F (36.6 C) (09/06 0354) Temp Source: Oral (09/06 0354) BP: 149/67 (09/06 0354) Pulse Rate: 85 (09/06 0354)  Labs: Recent Labs    01/25/21 0630 01/26/21 0600 01/27/21 0417  HGB  --  6.7* 9.0*  HCT  --  21.3* 26.9*  PLT  --   --  147*  HEPARINUNFRC 0.50 0.59 0.50  CREATININE 0.98 1.12* 1.14*     Estimated Creatinine Clearance: 70 mL/min (A) (by C-G formula based on SCr of 1.14 mg/dL (H)).   Medications:  Scheduled:   sodium chloride   Intravenous Once   Chlorhexidine Gluconate Cloth  6 each Topical Daily   cholecalciferol  1,000 Units Oral Daily   vitamin B-6  12.5 mg Oral BID   And   doxylamine (Sleep)  12.5 mg Oral BID   DULoxetine  20 mg Oral Daily   famotidine  20 mg Oral BID   feeding supplement (GLUCERNA SHAKE)  237 mL Oral BID BM   furosemide  40 mg Intravenous Daily   hydrALAZINE  75 mg Oral Q8H   hydrocortisone sod succinate (SOLU-CORTEF) inj  40 mg Intravenous Daily   mouth rinse  15 mL Mouth Rinse q12n4p   metoCLOPramide (REGLAN) injection  5 mg Intravenous Q6H   pantoprazole  40 mg Intravenous Q12H   prenatal vitamin w/FE, FA  1 tablet Oral Q1200   sodium chloride  1 spray Each Nare Q2H   sodium chloride flush  10-40 mL Intracatheter Q12H   sucralfate  1 g Oral TID WC & HS   vitamin B-12  1,000 mcg Oral Daily    Assessment: 29 YOF currently 31 weeks 1d pregnant, on IV heparin for R-brachial DVT. Heparin level = 0.5, remains therapeutic on 1350 units/hr. No bleeding noted per chart.  Noted patient's hgb has been dropping slowly over the past 2 wks, 9 >> 6.7, and received 2 units PRBC over the weekend. Current hgb 9, pltc 147K   Goal of Therapy:  Heparin  level 0.3-0.7 units/ml Monitor platelets by anticoagulation protocol: Yes   Plan: Continue current Heparin rate of 1350 units/hr (13.5 ml/hr). Continue to monitor H&H and platelets Monitor s/sx bleeding  03/29/21, PharmD, BCPS, BCPPS Clinical Pharmacist  Pager: [phone]  01/27/2021 9:39 AM

## 2021-01-27 NOTE — Progress Notes (Signed)
22 PROGRESS NOTE  Ashley Camacho  WUJ:[id number] DOB: 12-03-1990 DOA: 12/16/2020 PCP: Hildred Laser, MD   Brief Narrative: Ashley Camacho is a 30 y.o. G2P0101 with a history of T1DM, chronic HFrEF who was admitted initially 6/30 to [facility] for nausea and vomiting subsequently transferred to [facility] for rhinovirus pneumonia, discharged 7/20 only to return 7/22 with starvation ketosis and brachial vein DVT. TPN thru PICC was started and ultimately the patient was transferred to [facility] for cortrack placement on 7/26. Further complications detailed below:  7/27 cardiology consulted for cardiomyopathy, PICC line inserted, tube feeding started. 7/29 having recurrent retention, Foley catheter inserted 7/30 PRBC transfused 8/5 PRBC transfused 8/16 cardiology signed off, no follow-up recommended 8/19 nephrology consulted, no follow-up recommended 8/20 PRBC transfused 8/21 rescue course of betamethasone 8/22 Foley catheter reinserted after removal due to retention. 8/23 GI consulted. MFM consulted, nephrology signed off. 8/24 underwent EGD shows severe esophagitis with mild oozing.   Dobbhoff removed patient on oral diet. 8/25 PICC line removed. 8/26 started on steroids for adrenal insufficiency. 8/27 2 episodes of hematemesis.  Started on PPI drip.  GI reconsulted.  Lovenox held. 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 8/30 PICC line reinserted due to poor IV access, difficulty with blood draws and need for IV heparin.  Attempts at diuresis ongoing. Required 2u PRBCs for progressive anemia in the absence of any further bleeding. Vomiting has resolved and po intake improving.   Assessment & Plan: Active Problems:   Delivery with history of C-section   Acute kidney injury (HCC)   Hyperemesis   DKA, type 1, not at goal [facility])   Acute deep vein thrombosis (DVT) of brachial vein of right upper extremity (HCC)   Vitreous hemorrhage of right eye (HCC)    Gastroparesis due to DM (HCC)   Diabetic retinopathy (HCC)   Acute on chronic combined systolic and diastolic CHF (congestive heart failure) (HCC)   Type 1 diabetes mellitus affecting pregnancy in third trimester, antepartum   HFrEF (heart failure with reduced ejection fraction) (HCC)   Anemia during pregnancy   Pre-existing type 1 diabetes mellitus during pregnancy in third trimester   History of preterm delivery, currently pregnant   Leg edema   Acute esophagitis   Pregnancy with 31 completed weeks gestation  Hematemesis due to grade C esophagitis: Noted on EGD 8/24.  - Continue PPI IV, pepcid IV, and carafate. Has resolved for several days. - Continue soft/dysphagia diet as tolerated. GI not planning repeat endoscopic evaluation at this time.    Hyperemesis, intractable nausea and vomiting, gastroparesis:  - Continue scheduled reglan and prn zofran - Symptoms have improved. ?if steroids for AI are helping.  - Push per oral intake which is increasing. I've encouraged her to push per oral intake as she is not currently limited by nausea or vomiting, in hopes that we will avoid reinsertion of core track/TPN.   Acute blood loss anemia on anemia of pregnancy: s/p 7u PRBCs this hospitalization (last 2u 9/5) - Monitor  Adrenal insufficiency: ACTH stim test diagnostic.  - Continue hydrocortisone taper as ordered. Plan eventual dosing 20mg  qAM, 10mg  qPM.  - Will need stress dosing at time of delivery per OB. Reasonable dosing initially could be hydrocortisone 50mg  q8h.   Right brachial vein DVT: Dx 7/17. Seems to be recannulating based on repeat U/S.  - Continue therapeutic anticoagulation with heparin. Since needing transfusion support on 9/5, will stay on heparin for now.  T1DM  with gastroparesis and peripheral neuropathy: Brittle control due to sensitivity and inconsistent calorie inputs. HbA1c was 5.8% 11/21/2020 (impacted by mid-late pregnancy).  - Continue insulin gtt and  dextrose-containing IVF to prevent ketosis while po intake is minimal. Hope to transition back to insulin pump soon as her intake is becoming more reliable and her mentation is clearing. The sensitivity she's exhibiting and lows make transitioning to basal-bolus insulin fraught. Discussed with diabetes coordinator today. If remains stable later in the week, could reconsider transition to pump.  Anasarca due to severe malnutrition related to hyperemesis as above as well as acute on chronic HFrEF, HTN: Updated echo showed improved LVEF 45% now up to 50-55%. RV and pulmonary artery pressures wnl. Small pericardial effusion noted without tamponade.  - Continue lasix 40mg  IV daily and monitoring I/O, weights.   Chronic HTN: - We've been increasing hydralazine dosing, now up to 75mg  q8h. Monitoring for preeclampsia per OBGYN. LFTs have been mildly elevated. Platelets low. Bilirubin not suggestive of hemolysis at this time, and anemia has been a concern prior to right now. Discussed with Dr. who is beginning to have concern for development of SIPE. Will continue monitoring closely.   Acute urinary retention: Right hydronephrosis is likely physiologic in this setting.  - Voiding without difficulty at this time.  AKI: Improved. - SCr remains stable with diuresis at this time. Will monitor regularly.  IUP:  - MFM recommends rLTCS at 34 weeks (9/20). Continue NSTs/BPP/serial growth scans. Leaving orders in this regard to MFM/OB. NST on 9/4 was reactive, reassuring. - Has received BMZ - Procardia given 9/5 for contractions. - OB co-managing   UTI: Treated with amoxicillin 8/16 - 8/22  Limited venous access:  - PICC reinserted 8/30.  DVT prophylaxis: Heparin gtt Code Status: Full Family Communication: None at bedside Disposition Plan:  Status is: Inpatient  Remains inpatient appropriate because:IV treatments appropriate due to intensity of illness or inability to take PO  Dispo: The  patient is from: Home              Anticipated d/c is to: Home              Patient currently is not medically stable to d/c.  Consultants:  OB/GYN Nephrology Cardiology GI  Subjective: Eating better, most of breakfast, all of dinner, and a glucerna for lunch every day. No bleeding, doesn't feel swelling is any different. Had contractions yesterday, none since procardia.  Objective: BP (!) 148/65 (BP Location: Right Leg)   Pulse 89   Temp 97.7 F (36.5 C) (Oral)   Resp 18   Ht 5\' 1"  (1.549 m)   Wt 80.6 kg   LMP  (LMP Unknown)   SpO2 97%   BMI 33.56 kg/m   Gen: 30 y.o. female in no distress Pulm: Nonlabored breathing room air. Clear. CV: Regular rate and rhythm. No murmur, rub, or gallop. No JVD, stable significant pitting dependent edema. GI: Abdomen soft, gravid, non-tender, non-distended, with normoactive bowel sounds.  Ext: Warm, no deformities Skin: No new rashes, lesions or ulcers on visualized skin. Neuro: Alert and oriented. No focal neurological deficits. Psych: Judgement and insight appear fair. Mood euthymic & affect congruent. Behavior is appropriate.    Lab Results  Component Value Date   HGB 9.0 (L) 01/27/2021      LOS: 42 days   Time spent: 35 minutes.  , MD [facility] [email or web address] 01/27/2021, 2:26 PM

## 2021-01-27 NOTE — Progress Notes (Signed)
Pt more comfortable now. Had ctxns/cramping - was 4/10 now much improved. Can't report a time for frequency of ctxns.  FHT 150, + accels, no decels, mod variability Toco q2-6 min then irritability after nifedipine CE 2/60/-3 (same as my prior exam) - Reviewed PTL precautions with her and when to call out to nurses.

## 2021-01-27 NOTE — Progress Notes (Signed)
Inpatient Diabetes Program Recommendations  Diabetes Treatment Program Recommendations  ADA Standards of Care 2018 Diabetes in Pregnancy Target Glucose Ranges:  Fasting: 60 - 90 mg/dL Preprandial: 60 - 518 mg/dL 1 hr postprandial: Less than 140mg /dL (from first bite of meal) 2 hr postprandial: Less than 120 mg/dL (from first bite of meal)      Lab Results  Component Value Date   GLUCAP 105 (H) 01/27/2021   HGBA1C 5.8 (H) 11/21/2020    Review of Glycemic Control Results for Ashley Camacho, Ashley Camacho (MRN [name]) as of 01/27/2021 13:40  Ref. Range 01/24/2021 05:06 01/25/2021 05:54 01/26/2021 05:31 01/27/2021 12:39  Glucose-Capillary Latest Ref Range: 70 - 99 mg/dL 03/29/2021 (H) 160 (H) 95 109 (H)   Diabetes history: DM 1 Outpatient Diabetes medications: Omnipod with Novolog insulin  Most recent insulin pump rates from [facility]: 0.05 units/hr= 1.2 units in 24 hours, 1 units drops blood sugar 100 mg/dL, 1 units per 30 grams of CHO   In DM coordinator note from 11/20/2020: "Current pump settings are: 12-6am  0.15 units/hour 6am-12am 0.25 units/hour Total 5.4 units/24 hours  CF 80 (1 unit drops glucose 80 mg/dl) I:C 11/22/2020 (1 unit covers 20 grams of carbs) Target BG 100 mg/dl"   Current orders for Inpatient glycemic control:  IV insulin Solucortef 40 mg QD with plan for 20 mg QAM/10 mg QPM  Inpatient Diabetes Program Recommendations:    Reapplied Freestyle Libre to left upper arm. Encouraged patient to contact significant other to bring insulin pump/ supplies. Patient does not remember if she has enough supplies to operate. Encouraged to have them brought to hospital and team can coordinate additional supplies for following delivery. Reviewed difficulty of daily adjustments with outpatient endocrinology and that SQ injections will offer ease to titration. Patient reports improvement with intake. Denies nausea/vomiting. Steroids beginning to taper.  Discussed plan of care with Dr 5:57. Would be open to  possible transition in AM.   Thanks, Jarvis Newcomer, MSN, RNC-OB Diabetes Coordinator [phone] (8a-5p)

## 2021-01-27 NOTE — Progress Notes (Signed)
FACULTY PRACTICE ANTEPARTUM COMPREHENSIVE PROGRESS NOTE  Ashley Camacho is a 30 y.o. G2P0101 at [redacted]w[redacted]d who is admitted for Complications of T1DM in pregnancy.  Estimated Date of Delivery: 03/30/21 Fetal presentation is cephalic.  Length of Stay:  42 Days. Admitted 12/16/2020  Subjective:  No changes for how she is doing. She tolerating PO. She is ambulatory. She denies pain. Denies emesis.   Patient reports good fetal movement.  She reports no uterine contractions, no bleeding and no loss of fluid per vagina.  Vitals:  Blood pressure 140/61, pulse 91, temperature 98.5 F (36.9 C), temperature source Oral, resp. rate 18, height 5\' 1"  (1.549 m), weight 80.6 kg, SpO2 98 %. Physical Examination: CONSTITUTIONAL: Well-developed, well-nourished female in no acute distress.  NEUROLOGIC: Alert and oriented to person, place, and time. No cranial nerve deficit noted. PSYCHIATRIC: Normal mood and affect. Normal behavior. Normal judgment and thought content. CARDIOVASCULAR: Normal heart rate noted, regular rhythm RESPIRATORY: Effort and breath sounds normal, no problems with respiration noted MUSCULOSKELETAL: Normal range of motion. No edema and no tenderness. 2+ distal pulses. ABDOMEN: Soft, nontender, nondistended, gravid. CERVIX: Last checked by me several weeks ago and was 2/60. No recheck since.  Fetal monitoring: FHR: 140 bpm, Variability: moderate, Accelerations: Present, Decelerations: Absent  Uterine activity: irregular contractions  CBC Latest Ref Rng & Units 01/27/2021 01/26/2021 01/24/2021  WBC 4.0 - 10.5 K/uL 10.1 - 9.1  Hemoglobin 12.0 - 15.0 g/dL 9.0(L) 6.7(LL) 7.2(L)  Hematocrit 36.0 - 46.0 % 26.9(L) 21.3(L) 21.7(L)  Platelets 150 - 400 K/uL 147(L) - 171   CMP Latest Ref Rng & Units 01/27/2021 01/26/2021 01/25/2021  Glucose 70 - 99 mg/dL 91 03/27/2021) 191(Y)  BUN 6 - 20 mg/dL 782(N) 20 18  Creatinine 0.44 - 1.00 mg/dL 56(O) 1.30(Q) 6.57(Q  Sodium 135 - 145 mmol/L 135 134(L) 132(L)  Potassium  3.5 - 5.1 mmol/L 3.9 4.0 3.7  Chloride 98 - 111 mmol/L 104 98 103  CO2 22 - 32 mmol/L 24 24 24   Calcium 8.9 - 10.3 mg/dL 8.3(L) 8.8(L) 7.9(L)  Total Protein 6.5 - 8.1 g/dL 4.8(L) - -  Total Bilirubin 0.3 - 1.2 mg/dL 0.9 - -  Alkaline Phos 38 - 126 U/L 90 - -  AST 15 - 41 U/L 59(H) - -  ALT 0 - 44 U/L 82(H) - -    No results found.  Current scheduled medications  sodium chloride   Intravenous Once   Chlorhexidine Gluconate Cloth  6 each Topical Daily   cholecalciferol  1,000 Units Oral Daily   vitamin B-6  12.5 mg Oral BID   And   doxylamine (Sleep)  12.5 mg Oral BID   DULoxetine  20 mg Oral Daily   famotidine  20 mg Oral BID   feeding supplement (GLUCERNA SHAKE)  237 mL Oral BID BM   furosemide  40 mg Intravenous Daily   hydrALAZINE  75 mg Oral Q8H   hydrocortisone sod succinate (SOLU-CORTEF) inj  40 mg Intravenous Daily   mouth rinse  15 mL Mouth Rinse q12n4p   metoCLOPramide (REGLAN) injection  5 mg Intravenous Q6H   pantoprazole  40 mg Intravenous Q12H   prenatal vitamin w/FE, FA  1 tablet Oral Q1200   sodium chloride  1 spray Each Nare Q2H   sodium chloride flush  10-40 mL Intracatheter Q12H   sucralfate  1 g Oral TID WC & HS   vitamin B-12  1,000 mcg Oral Daily    I have reviewed the patient's current  medications.  ASSESSMENT: Active Problems:   Delivery with history of C-section   Anemia affecting pregnancy in second trimester   Acute kidney injury (HCC)   Acute deep vein thrombosis (DVT) of brachial vein of right upper extremity (HCC)   Gastroparesis due to DM (HCC)   Diabetic retinopathy (HCC)   Acute on chronic combined systolic and diastolic CHF (congestive heart failure) (HCC)   Anemia during pregnancy   Pre-existing type 1 diabetes mellitus during pregnancy in second trimester   History of preterm delivery, currently pregnant   Leg edema   Acute esophagitis  PLAN: OB Care  - BPP weekly - reordered for today - Growth q 4 wks, last completed 8/22 -  S/p BMZ 8/23-24 - MFM following- see their prior note for details - Had PT ctxns over the weekend that resolved with Procardia x1  GI N/V/Esophagitis - Tolerating carb modified diet, Glucerna - Continue pepcid, protonix, carafate. Scheduled anti-emetic is Reglan - S/p EGD with GI on 8/24 Malnutrition - Dietician on board - B12 replacement per IM - On daily vitamin  Endo T1DM - on Endotool. Fluctuating needs and still needs IV dosing Adrenal Insufficiency - Diagnosed on 8/26 and on steroids - Will need stress dose steroids at the time of delivery  Neph/GU  - UOP remains adequate. Now s/p foley and voiding spontaneously  - s/p Amox for UTI - UCX ordered 9/6  Heme Anemia - Work up per IM - Fecal occult positive- GI aware, in s/o pregnancy, will hold on colonoscopy at this time.   - If Hgb worsens and assessment/intervention is warranted may reconsider  - HgB appropriate after 2u PRBC over the weekend. Hgb this AM was 9.0   Right brachial VTE - diagnosed in July 2022 - Had been on Lovenox but due to progressing anemia and hematemesis, it was held - She is now on Heparin IV - level followed by pharmacy Cardio - HTN- currently on Hydralazine 75mg  q8hr - this is an increase from baseline of 25 mg Q8 - At this time, no evidence for SIPE, but she is high risk for this. We will continue to watch her closely for this.  - s/p cardiology consult: CHF stable  Maternal Care - tylenol prn - encourage ambulation, PT/OT on board   Plan as outlined above, continue with supportive care with co-management per IM.  Goal for delivery @ 34wk via Repeat C-section and bilateral tubal sterilization if maternal/fetal well being remains stable. Medicaid tubal papers signed and on her paper chart.   Continue routine antenatal care.  , MD, FACOG Obstetrician & Gynecologist, [facility] for [facility], [facility]

## 2021-01-27 NOTE — Progress Notes (Signed)
Physical Therapy Treatment Patient Details Name: Ashley Camacho MRN: [id number] DOB: [birth year] Today's Date: 01/27/2021    History of Present Illness 30 y.o. 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 Medical history significant of [redacted]w[redacted]d on presentation, Estimated Date of Delivery: 03/30/21 by by 9 week ultrasound however trying to make it to 34w for planned c-section, Type 1 diabetic, Cardiomyopathy, chronic systolic heart failure, DVT in bilateral arm. H/o recent hospitalizations d/t hyperemesis and sepsis secondary to pneumonia.    PT Comments    Pt making steady progress with gait and activity tolerance. Pt with much brighter affect. Continue to maximize mobility and independence in preparation for delivery and return home.   Follow Up Recommendations  No PT follow up     Equipment Recommendations  None recommended by PT    Recommendations for Other Services       Precautions / Restrictions Precautions Precautions: Other (comment) Precaution Comments: swelling in LEs, pregnancy    Mobility  Bed Mobility               General bed mobility comments: pt at EOB    Transfers Overall transfer level: Needs assistance Equipment used: 4-wheeled walker Transfers: Sit to/from Stand Sit to Stand: Supervision         General transfer comment: supervision for safety and lines, verbal cues to engage brakes before standing from rollator  Ambulation/Gait Ambulation/Gait assistance: Supervision Gait Distance (Feet): 300 Feet Assistive device: 4-wheeled walker Gait Pattern/deviations: Step-through pattern;Wide base of support Gait velocity: decreased Gait velocity interpretation: 1.31 - 2.62 ft/sec, indicative of limited community ambulator General Gait Details: 2 seated rest breaks. supervision for safety and line management   Stairs              Wheelchair Mobility    Modified Rankin (Stroke Patients Only)       Balance Overall balance assessment: Mild deficits observed, not formally tested                                          Cognition Arousal/Alertness: Awake/alert Behavior During Therapy: WFL for tasks assessed/performed Overall Cognitive Status: Within Functional Limits for tasks assessed                                        Exercises      General Comments        Pertinent Vitals/Pain Pain Assessment: Faces Faces Pain Scale: Hurts little more Pain Location: abdominal contractions - nursing/MD aware Pain Descriptors / Indicators: Contraction Pain Intervention(s): Monitored during session    Home Living                      Prior Function            PT Goals (current goals can now be found in the care plan section) Progress towards PT goals: Progressing toward goals    Frequency    Min 3X/week      PT Plan Current plan remains appropriate    Co-evaluation              AM-PAC PT "6 Clicks" Mobility   Outcome Measure  Help needed turning from your back to your side while in a flat  bed without using bedrails?: None Help needed moving from lying on your back to sitting on the side of a flat bed without using bedrails?: None Help needed moving to and from a bed to a chair (including a wheelchair)?: A Little Help needed standing up from a chair using your arms (e.g., wheelchair or bedside chair)?: A Little Help needed to walk in hospital room?: A Little Help needed climbing 3-5 steps with a railing? : A Little 6 Click Score: 20    End of Session   Activity Tolerance: Patient tolerated treatment well Patient left: in bed;with call bell/phone within reach (sitting EOB) Nurse Communication: Mobility status PT Visit Diagnosis: Other abnormalities of gait and mobility (R26.89);Muscle weakness (generalized) (M62.81)     Time:  9518-8416 PT Time Calculation (min) (ACUTE ONLY): 14 min  Charges:  $Gait Training: 8-22 mins                     [facility] PT Acute Rehabilitation Services Pager [phone] Office [phone]    Angelina Ok [facility] 01/27/2021, 4:39 PM

## 2021-01-28 LAB — CULTURE, OB URINE

## 2021-01-28 LAB — COMPREHENSIVE METABOLIC PANEL WITH GFR
ALT: 92 U/L — ABNORMAL HIGH (ref 0–44)
AST: 62 U/L — ABNORMAL HIGH (ref 15–41)
Albumin: 2.3 g/dL — ABNORMAL LOW (ref 3.5–5.0)
Alkaline Phosphatase: 85 U/L (ref 38–126)
Anion gap: 8 (ref 5–15)
BUN: 23 mg/dL — ABNORMAL HIGH (ref 6–20)
CO2: 26 mmol/L (ref 22–32)
Calcium: 8.6 mg/dL — ABNORMAL LOW (ref 8.9–10.3)
Chloride: 101 mmol/L (ref 98–111)
Creatinine, Ser: 1.2 mg/dL — ABNORMAL HIGH (ref 0.44–1.00)
GFR, Estimated: >60 mL/min
Glucose, Bld: 92 mg/dL (ref 70–99)
Potassium: 3.3 mmol/L — ABNORMAL LOW (ref 3.5–5.1)
Sodium: 135 mmol/L (ref 135–145)
Total Bilirubin: 0.9 mg/dL (ref 0.3–1.2)
Total Protein: 4.6 g/dL — ABNORMAL LOW (ref 6.5–8.1)

## 2021-01-28 LAB — CBC WITH DIFFERENTIAL/PLATELET
Abs Immature Granulocytes: 0.04 K/uL (ref 0.00–0.07)
Basophils Absolute: 0 K/uL (ref 0.0–0.1)
Basophils Relative: 0 %
Eosinophils Absolute: 0.1 K/uL (ref 0.0–0.5)
Eosinophils Relative: 1 %
HCT: 27.3 % — ABNORMAL LOW (ref 36.0–46.0)
Hemoglobin: 9 g/dL — ABNORMAL LOW (ref 12.0–15.0)
Immature Granulocytes: 1 %
Lymphocytes Relative: 19 %
Lymphs Abs: 1.6 K/uL (ref 0.7–4.0)
MCH: 30.5 pg (ref 26.0–34.0)
MCHC: 33 g/dL (ref 30.0–36.0)
MCV: 92.5 fL (ref 80.0–100.0)
Monocytes Absolute: 0.6 K/uL (ref 0.1–1.0)
Monocytes Relative: 7 %
Neutro Abs: 5.9 K/uL (ref 1.7–7.7)
Neutrophils Relative %: 72 %
Platelets: 144 K/uL — ABNORMAL LOW (ref 150–400)
RBC: 2.95 MIL/uL — ABNORMAL LOW (ref 3.87–5.11)
RDW: 16.5 % — ABNORMAL HIGH (ref 11.5–15.5)
WBC: 8.2 K/uL (ref 4.0–10.5)
nRBC: 0 % (ref 0.0–0.2)

## 2021-01-28 LAB — GLUCOSE, CAPILLARY
Glucose-Capillary: 81 mg/dL (ref 70–99)
Glucose-Capillary: 77 mg/dL (ref 70–99)
Glucose-Capillary: 48 mg/dL — ABNORMAL LOW (ref 70–99)
Glucose-Capillary: 83 mg/dL (ref 70–99)

## 2021-01-28 LAB — HEPARIN LEVEL (UNFRACTIONATED): Heparin Unfractionated: 0.36 IU/mL (ref 0.30–0.70)

## 2021-01-28 MED ORDER — POTASSIUM CHLORIDE CRYS ER 20 MEQ PO TBCR
40.0000 meq | EXTENDED_RELEASE_TABLET | Freq: Once | ORAL | Status: AC
  Administered 2021-01-28: 40 meq via ORAL
  Filled 2021-01-28: qty 2

## 2021-01-28 MED ORDER — INSULIN ASPART 100 UNIT/ML IJ SOLN
0.0000 [IU] | INTRAMUSCULAR | Status: DC
Start: 2021-01-28 — End: 2021-01-28

## 2021-01-28 MED ORDER — PANTOPRAZOLE SODIUM 40 MG PO TBEC
40.0000 mg | DELAYED_RELEASE_TABLET | Freq: Every day | ORAL | Status: DC
  Administered 2021-01-30 – 2021-03-04 (×33): 40 mg via ORAL
  Filled 2021-01-28 (×34): qty 1

## 2021-01-28 MED ORDER — INSULIN DETEMIR 100 UNIT/ML ~~LOC~~ SOLN
8.0000 [IU] | Freq: Every day | SUBCUTANEOUS | Status: DC
  Administered 2021-01-28: 8 [IU] via SUBCUTANEOUS
  Filled 2021-01-28: qty 0.08

## 2021-01-28 NOTE — Progress Notes (Signed)
Inpatient Diabetes Program Recommendations  Diabetes Treatment Program Recommendations  ADA Standards of Care 2018 Diabetes in Pregnancy Target Glucose Ranges:  Fasting: 60 - 90 mg/dL Preprandial: 60 - 235 mg/dL 1 hr postprandial: Less than 140mg /dL (from first bite of meal) 2 hr postprandial: Less than 120 mg/dL (from first bite of meal)      Lab Results  Component Value Date   GLUCAP 48 (L) 01/28/2021   HGBA1C 5.8 (H) 11/21/2020    Review of Glycemic Control Results for Ashley Camacho, Ashley Camacho (MRN [name]) as of 01/28/2021 14:10  Ref. Range 01/27/2021 12:39 01/28/2021 06:16 01/28/2021 12:26 01/28/2021 13:59 01/28/2021 14:21  Glucose-Capillary Latest Ref Range: 70 - 99 mg/dL 03/30/2021 (H) 81 77 48 (L) 83   Diabetes history: DM 1 Outpatient Diabetes medications: Omnipod with Novolog insulin  Most recent insulin pump rates from [facility]: 0.05 units/hr= 1.2 units in 24 hours, 1 units drops blood sugar 100 mg/dL, 1 units per 30 grams of CHO   In DM coordinator note from 11/20/2020: "Current pump settings are: 12-6am  0.15 units/hour 6am-12am 0.25 units/hour Total 5.4 units/24 hours  CF 80 (1 unit drops glucose 80 mg/dl) I:C 11/22/2020 (1 unit covers 20 grams of carbs) Target BG 100 mg/dl"   Current orders for Inpatient glycemic control:  IV insulin Solucortef 40 mg QD with plan for 20 mg QAM/10 mg QPM   Inpatient Diabetes Program Recommendations:    @848 - Secure chat sent to Dr 6:23- Recommend proceeding with transition. Consider Levemir 8 units two hours prior to discontinuation of IV insulin, then QD to follow, as well as Novolog 0-6 units Q4H. Discussed with RN. Orders received and placed.   @1359  at bedside. Patient asymptomatic. Hypoglycemia order set placed. 15 g of CHO juice given. Dr made aware. RN at bedside and encouraged to recheck CBG by fingerstick in 15 minutes.  Discussed plan of care with Dr Margo Aye given lab values and plan for repeat LFTs. Also, reviewed hypoglycemia, probable  reasoning and interventions provided. At this time, recommend changing plan back to IV insulin given possibility for delivery on 9/8 d/t worsening liver function, platelets and hypertension. Orders received and communicated with Dr Margo Aye.   In planning for delivery recommend: Continuing with IV insulin for at least 24 hours post op to determine insulin needs.  Thanks, Para March, MSN, RNC-OB Diabetes Coordinator [phone] (8a-5p)

## 2021-01-28 NOTE — Progress Notes (Addendum)
PROGRESS NOTE  Ashley Camacho ZPH:[id number] DOB: 24-Jan-1991 DOA: 12/16/2020 PCP: Hildred Laser, MD  HPI/Recap of past 24 hours: Ashley Camacho is a 30 y.o. G2P0101 with a history of T1DM, chronic HFrEF who was admitted initially 6/30 to [facility] for nausea and vomiting subsequently transferred to [facility] for rhinovirus pneumonia, discharged 7/20 only to return 7/22 with starvation ketosis and brachial vein DVT. TPN thru PICC was started and ultimately the patient was transferred to [facility] for cortrack placement on 7/26. Further complications detailed below:  7/27 cardiology consulted for cardiomyopathy, PICC line inserted, tube feeding started. 7/29 having recurrent retention, Foley catheter inserted 7/30 PRBC transfused 8/5 PRBC transfused 8/16 cardiology signed off, no follow-up recommended 8/19 nephrology consulted, no follow-up recommended 8/20 PRBC transfused 8/21 rescue course of betamethasone 8/22 Foley catheter reinserted after removal due to retention. 8/23 GI consulted. MFM consulted, nephrology signed off. 8/24 underwent EGD shows severe esophagitis with mild oozing.   Dobbhoff removed patient on oral diet. 8/25 PICC line removed. 8/26 started on steroids for adrenal insufficiency. 8/27 2 episodes of hematemesis.  Started on PPI drip.  GI reconsulted.  Lovenox held. 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 8/30 PICC line reinserted due to poor IV access, difficulty with blood draws and need for IV heparin.   Attempts at diuresis ongoing. Required 2u PRBCs for progressive anemia in the absence of any further bleeding. Vomiting has resolved and po intake improving.   01/28/2021: Patient was seen and examined at her bedside.  She reports having darkened urine.  Denies any vaginal discharge or bleeding.  Admits to not having adequate oral fluid intake.  She has been tolerating a regular consistency diet.  We will transition her to subcu  insulin today.  Blood pressure overall stable.  Assessment/Plan: Active Problems:   Delivery with history of C-section   Acute kidney injury (HCC)   Hyperemesis   DKA, type 1, not at goal [facility])   Acute deep vein thrombosis (DVT) of brachial vein of right upper extremity (HCC)   Vitreous hemorrhage of right eye (HCC)   Gastroparesis due to DM (HCC)   Diabetic retinopathy (HCC)   Acute on chronic combined systolic and diastolic CHF (congestive heart failure) (HCC)   Type 1 diabetes mellitus affecting pregnancy in third trimester, antepartum   HFrEF (heart failure with reduced ejection fraction) (HCC)   Anemia during pregnancy   Pre-existing type 1 diabetes mellitus during pregnancy in third trimester   History of preterm delivery, currently pregnant   Leg edema   Acute esophagitis   Pregnancy with 31 completed weeks gestation  Resolved hematemesis due to grade C esophagitis: Noted on EGD 8/24.  IV Protonix switch to oral on 01/28/2021. She is on 20 mg p.o. Pepcid twice daily, p.o. Protonix 40 mg daily, and Carafate. GI not planning repeat endoscopic evaluation at this time.    Elevated liver chemistries AST and ALT slightly uptrending this morning. Discussed with OB/GYN Dr. Para March, will repeat LFTs.   Improved hyperemesis, intractable nausea and vomiting, gastroparesis:  - Continue scheduled reglan and prn zofran - Symptoms have improved. ?if steroids for AI are helping.  - Push per oral intake which is increasing. I've encouraged her to push per oral intake as she is not currently limited by nausea or vomiting, in hopes that we will avoid reinsertion of core track/TPN.    Acute blood loss anemia on anemia of pregnancy:  s/p 7u PRBCs this hospitalization (  last 2u 9/5) Hemoglobin is stable this morning 9.0 from 9.0. No overt bleeding.  Adrenal insufficiency: ACTH stim test diagnostic.  - Continue hydrocortisone taper as ordered. Plan eventual dosing 20mg  qAM, 10mg  qPM.  - Will  need stress dosing at time of delivery per OB. Reasonable dosing initially could be hydrocortisone 50mg  q8h.    Right brachial vein DVT: Dx 7/17.  Seems to be recannulating based on repeat U/S.  - Continue therapeutic anticoagulation with heparin. Since needing transfusion support on 9/5, will stay on heparin for now.   T1DM with gastroparesis and peripheral neuropathy: Brittle control due to sensitivity and inconsistent calorie inputs. HbA1c was 5.8% 11/21/2020 (impacted by mid-late pregnancy).  Switching to insulin subcu on 01/28/2021 with the assistance of diabetes coordinator. Hope to transition back to insulin pump soon as her intake is becoming more reliable and her mentation is clearing. The sensitivity she's exhibiting and lows make transitioning to basal-bolus insulin fraught. Discussed with diabetes coordinator today. If remains stable later in the week, could reconsider transition to pump.   Anasarca due to severe malnutrition related to hyperemesis as above as well as acute on chronic HFrEF, HTN:  Updated echo showed improved LVEF 45% now up to 50-55%. RV and pulmonary artery pressures wnl. Small pericardial effusion noted without tamponade. She is currently on IV Lasix 40 mg daily with I's and O's and daily weight.. Net I&O -2.2 L   HTN: BP stable Currently on p.o. hydralazine 75mg  q8h. Monitoring for preeclampsia per OBGYN. LFTs have been mildly elevated. Platelets low. Bilirubin not suggestive of hemolysis at this time, and anemia has been a concern prior to right now. Discussed with Dr. Para March who is beginning to have concern for development of SIPE. Will continue monitoring closely.    Acute urinary retention:  Right hydronephrosis is likely physiologic in this setting.  - Voiding without difficulty at this time.   AKI:  -Slight uprising of creatinine this morning 1.20 from 1.14. She is on IV Lasix, closely monitor while on diuretics. Monitor urine output Repeat BMP in the  morning, if continues to uptrend hold off Lasix.   IUP:  - MFM recommends rLTCS at 34 weeks (9/20). Continue NSTs/BPP/serial growth scans. Leaving orders in this regard to MFM/OB. NST on 9/4 was reactive, reassuring. - Has received BMZ - Procardia given 9/5 for contractions. - OB co-managing    Treated UTI: Treated with amoxicillin 8/16 - 8/22   Limited venous access:  - PICC reinserted 8/30.   DVT prophylaxis: Heparin gtt Code Status: Full Family Communication: None at bedside Disposition Plan:  Status is: Inpatient   Remains inpatient appropriate because:IV treatments appropriate due to intensity of illness or inability to take PO   Dispo: The patient is from: Home              Anticipated d/c is to: Home              Patient currently is not medically stable to d/c.   Consultants:  OB/GYN Nephrology Cardiology GI    Objective: Vitals:   01/28/21 0006 01/28/21 0416 01/28/21 0751 01/28/21 1119  BP: (!) 145/64 137/62 (!) 148/66 (!) 147/71  Pulse: 88 87 87 88  Resp: 18 16 16 16   Temp: 98.1 F (36.7 C) 98.5 F (36.9 C) 98.2 F (36.8 C) 98.3 F (36.8 C)  TempSrc: Oral Oral Oral Oral  SpO2: 97% 97% 98% 99%  Weight:      Height:  Intake/Output Summary (Last 24 hours) at 01/28/2021 1213 Last data filed at 01/28/2021 1136 Gross per 24 hour  Intake 457.93 ml  Output 2100 ml  Net -1642.07 ml   Filed Weights   01/20/21 0605 01/24/21 0603 01/26/21 0530  Weight: 72 kg 81.7 kg 80.6 kg    Exam:  General: 30 y.o. year-old female well developed well nourished in no acute distress.  Alert and oriented x3. Cardiovascular: Regular rate and rhythm with no rubs or gallops.  No thyromegaly or JVD noted.   Respiratory: Clear to auscultation with no wheezes or rales. Good inspiratory effort. Abdomen: Gravid abdomen with normal bowel sounds x4 quadrants. Musculoskeletal: 1+ pitting edema in lower extremities bilaterally. 2/4 pulses in all 4 extremities. Skin: No  ulcerative lesions noted or rashes, Psychiatry: Mood is appropriate for condition and setting   Data Reviewed: CBC: Recent Labs  Lab 01/22/21 0533 01/23/21 1449 01/24/21 0555 01/26/21 0600 01/27/21 0417 01/28/21 0350  WBC 9.9 11.0* 9.1  --  10.1 8.2  NEUTROABS  --   --   --   --   --  5.9  HGB 7.2* 7.1* 7.2* 6.7* 9.0* 9.0*  HCT 22.4* 22.0* 21.7* 21.3* 26.9* 27.3*  MCV 94.9 94.4 93.9  --  91.8 92.5  PLT 202 203 171  --  147* 144*   Basic Metabolic Panel: Recent Labs  Lab 01/24/21 0555 01/25/21 0630 01/26/21 0600 01/27/21 0417 01/28/21 0350  NA 136 132* 134* 135 135  K 3.2* 3.7 4.0 3.9 3.3*  CL 105 103 98 104 101  CO2 24 24 24 24 26   GLUCOSE 207* 357* 238* 91 92  BUN 20 18 20  24* 23*  CREATININE 1.07* 0.98 1.12* 1.14* 1.20*  CALCIUM 7.9* 7.9* 8.8* 8.3* 8.6*  MG  --  1.7  --   --   --    GFR: Estimated Creatinine Clearance: 66.5 mL/min (A) (by C-G formula based on SCr of 1.2 mg/dL (H)). Liver Function Tests: Recent Labs  Lab 01/23/21 1449 01/27/21 0417 01/28/21 0350  AST 54* 59* 62*  ALT 58* 82* 92*  ALKPHOS 78 90 85  BILITOT 0.6 0.9 0.9  PROT 4.5* 4.8* 4.6*  ALBUMIN 2.3* 2.4* 2.3*   No results for input(s): LIPASE, AMYLASE in the last 168 hours. No results for input(s): AMMONIA in the last 168 hours. Coagulation Profile: No results for input(s): INR, PROTIME in the last 168 hours. Cardiac Enzymes: No results for input(s): CKTOTAL, CKMB, CKMBINDEX, TROPONINI in the last 168 hours. BNP (last 3 results) No results for input(s): PROBNP in the last 8760 hours. HbA1C: No results for input(s): HGBA1C in the last 72 hours. CBG: Recent Labs  Lab 01/24/21 0506 01/25/21 0554 01/26/21 0531 01/27/21 1239 01/28/21 0616  GLUCAP 103* 107* 95 105* 81   Lipid Profile: No results for input(s): CHOL, HDL, LDLCALC, TRIG, CHOLHDL, LDLDIRECT in the last 72 hours. Thyroid Function Tests: No results for input(s): TSH, T4TOTAL, FREET4, T3FREE, THYROIDAB in the last 72  hours. Anemia Panel: No results for input(s): VITAMINB12, FOLATE, FERRITIN, TIBC, IRON, RETICCTPCT in the last 72 hours. Urine analysis:    Component Value Date/Time   COLORURINE AMBER (A) 01/10/2021 0131   APPEARANCEUR CLOUDY (A) 01/10/2021 0131   APPEARANCEUR Clear 09/18/2020 1153   LABSPEC 1.017 01/10/2021 0131   LABSPEC 1.026 03/01/2013 1844   PHURINE 5.0 01/10/2021 0131   GLUCOSEU NEGATIVE 01/10/2021 0131   GLUCOSEU >=500 03/01/2013 1844   HGBUR NEGATIVE 01/10/2021 0131   BILIRUBINUR NEGATIVE 01/10/2021  0131   BILIRUBINUR neg 10/23/2020 0953   BILIRUBINUR Negative 09/18/2020 1153   BILIRUBINUR Negative 03/01/2013 1844   KETONESUR NEGATIVE 01/10/2021 0131   PROTEINUR NEGATIVE 01/10/2021 0131   UROBILINOGEN 0.2 10/23/2020 0953   NITRITE NEGATIVE 01/10/2021 0131   LEUKOCYTESUR SMALL (A) 01/10/2021 0131   LEUKOCYTESUR Negative 03/01/2013 1844   Sepsis Labs: @LABRCNTIP (procalcitonin:4,lacticidven:4)  ) Recent Results (from the past 240 hour(s))  OB Urine Culture     Status: Abnormal   Collection Time: 01/27/21 11:30 AM   Specimen: OB Clean Catch; Urine  Result Value Ref Range Status   Specimen Description OB CLEAN CATCH  Final   Special Requests NONE  Final   Culture (A)  Final    MULTIPLE SPECIES PRESENT, SUGGEST RECOLLECTION NO GROUP B STREP (S.AGALACTIAE) ISOLATED Performed at [facility], [address] [place], [place] [address]    Report Status 01/28/2021 FINAL  Final      Studies: US MFM FETAL BPP WO NON STRESS  Result Date: 01/28/2021 ----------------------------------------------------------------------  OBSTETRICS REPORT                        (Signed Final 01/28/2021 09:44 am) ---------------------------------------------------------------------- Patient Info  ID #:       [id number]                          D.O.B.:  27-Mar-1991 (29 yrs)  Name:       Myrna BlazerMIRISHA P [facility]                 Visit Date: 01/27/2021 05:14 pm  ---------------------------------------------------------------------- Performed By  Attending:        Lin Landsmanorenthian Booker      Ref. Address:      [facility]                    MD                                                              [facility]                                                              [address]                                                              [address]                                                              [address]  [place]                                                              [address]  Performed By:     Percell Boston          [facility]  Referred By:      Linzie Collin MD ---------------------------------------------------------------------- Orders  #  Description                           Code        Ordered By  1  [place] MFM FETAL BPP WO NON               52778.24    PAULA DUNCAN     STRESS ----------------------------------------------------------------------  #  Order #                     Accession #                Episode #  1  [id number]                   [id number]                 [id number] ---------------------------------------------------------------------- Indications  [redacted] weeks gestation of pregnancy                 Z3A.31  Encounter for antenatal screening,              Z36.9  unspecified  Polyhydramnios, third trimester, antepartum     O40.3XX0  condition or complication, unspecified fetus  Pre-existing diabetes, type 1, in pregnancy,    O24.013  third trimester  Hyperemesis gravidarum                          O21.0  Anemia during pregnancy in third trimester      O99.013  Abnormal fetal ultrasound (? liver              O28.9  calcification) ---------------------------------------------------------------------- Fetal Evaluation  Num Of Fetuses:          1  Fetal Heart  Rate(bpm):   137  Cardiac Activity:        Observed  Presentation:            Cephalic  Amniotic Fluid  AFI FV:      Subjectively upper-normal  AFI Sum(cm)     %Tile       Largest Pocket(cm)  20.1            77          6.2  RUQ(cm)       RLQ(cm)  LUQ(cm)        LLQ(cm)  6.2           3.6           5.7            4.6 ---------------------------------------------------------------------- Biophysical Evaluation  Amniotic F.V:   Increased                  F. Tone:         Observed  F. Movement:    Observed                   Score:           8/8  F. Breathing:   Observed ---------------------------------------------------------------------- Gestational Age  Best:          31w 1d     Det. ByMarcella Dubs         EDD:   03/30/21                                      (08/27/20) ---------------------------------------------------------------------- Anatomy  Thoracic:              Appears normal         Bladder:                Appears normal ---------------------------------------------------------------------- Impression  Antenatal testing performed given maternal T1DM  The biophysical profile was 8/8 with good fetal movement and  amniotic fluid volume. ---------------------------------------------------------------------- Recommendations  Continue weekly testing  Growth in 2 weeks. ----------------------------------------------------------------------               Lin Landsman, MD Electronically Signed Final Report   01/28/2021 09:44 am ----------------------------------------------------------------------   Scheduled Meds:  sodium chloride   Intravenous Once   Chlorhexidine Gluconate Cloth  6 each Topical Daily   cholecalciferol  1,000 Units Oral Daily   vitamin B-6  12.5 mg Oral BID   And   doxylamine (Sleep)  12.5 mg Oral BID   DULoxetine  20 mg Oral Daily   famotidine  20 mg Oral BID   feeding supplement (GLUCERNA SHAKE)  237 mL Oral BID BM   furosemide  40 mg Intravenous Daily    hydrALAZINE  75 mg Oral Q8H   hydrocortisone sod succinate (SOLU-CORTEF) inj  40 mg Intravenous Daily   insulin aspart  0-6 Units Subcutaneous Q4H   insulin detemir  8 Units Subcutaneous Daily   mouth rinse  15 mL Mouth Rinse q12n4p   metoCLOPramide (REGLAN) injection  5 mg Intravenous Q6H   [START ON 01/29/2021] pantoprazole  40 mg Oral Daily   prenatal vitamin w/FE, FA  1 tablet Oral Q1200   sodium chloride  1 spray Each Nare Q2H   sodium chloride flush  10-40 mL Intracatheter Q12H   sucralfate  1 g Oral TID WC & HS   vitamin B-12  1,000 mcg Oral Daily    Continuous Infusions:  dextrose 25 mL/hr at 01/27/21 1845   heparin 1,350 Units/hr (01/27/21 2207)   insulin 1.5 Units/hr (01/28/21 1105)     LOS: 43 days     Darlin Drop, MD [facility] Pager [phone]  If 7PM-7AM, please contact night-coverage [email or web address] Password [facility] 01/28/2021, 12:13 PM

## 2021-01-28 NOTE — Progress Notes (Signed)
ANTICOAGULATION CONSULT NOTE - Follow Up Consult  Pharmacy Consult for heparin Indication:  right brachial VTE  No Known Allergies  Patient Measurements: Height: 5\' 1"  (154.9 cm) Weight: 80.6 kg (177 lb 9.6 oz) IBW/kg (Calculated) : 47.8 Heparin Dosing Weight: 63.4kg  Vital Signs: Temp: 98.2 F (36.8 C) (09/07 0751) Temp Source: Oral (09/07 0751) BP: 148/66 (09/07 0751) Pulse Rate: 87 (09/07 0751)  Labs: Recent Labs    01/26/21 0600 01/27/21 0417 01/28/21 0350  HGB 6.7* 9.0* 9.0*  HCT 21.3* 26.9* 27.3*  PLT  --  147* 144*  HEPARINUNFRC 0.59 0.50 0.36  CREATININE 1.12* 1.14* 1.20*     Estimated Creatinine Clearance: 66.5 mL/min (A) (by C-G formula based on SCr of 1.2 mg/dL (H)).   Medications:  Scheduled:   sodium chloride   Intravenous Once   Chlorhexidine Gluconate Cloth  6 each Topical Daily   cholecalciferol  1,000 Units Oral Daily   vitamin B-6  12.5 mg Oral BID   And   doxylamine (Sleep)  12.5 mg Oral BID   DULoxetine  20 mg Oral Daily   famotidine  20 mg Oral BID   feeding supplement (GLUCERNA SHAKE)  237 mL Oral BID BM   furosemide  40 mg Intravenous Daily   hydrALAZINE  75 mg Oral Q8H   hydrocortisone sod succinate (SOLU-CORTEF) inj  40 mg Intravenous Daily   mouth rinse  15 mL Mouth Rinse q12n4p   metoCLOPramide (REGLAN) injection  5 mg Intravenous Q6H   pantoprazole  40 mg Intravenous Q12H   prenatal vitamin w/FE, FA  1 tablet Oral Q1200   sodium chloride  1 spray Each Nare Q2H   sodium chloride flush  10-40 mL Intracatheter Q12H   sucralfate  1 g Oral TID WC & HS   vitamin B-12  1,000 mcg Oral Daily    Assessment: 29 YOF currently 31 weeks 1d pregnant, on IV heparin for R-brachial DVT. Heparin level = 0.36, low-end therapeutic on 1350 units/hr. No bleeding noted per chart. Noted patient's hgb has been dropping slowly over the past 2 wks, 9 >> 6.7, and received 2 units PRBC over the weekend. Current Hgb 9, pltc 144K Given level has been  therapeutic on this rate, low hgb, and previous bleeding, will leave at current rate.   Goal of Therapy:  Heparin level 0.3-0.7 units/ml Monitor platelets by anticoagulation protocol: Yes   Plan:  Continue current Heparin rate of 1350 units/hr (13.5 ml/hr). Continue to monitor H&H and platelets Monitor s/sx bleeding  03/30/21, PharmD, BCPS, BCPPS Clinical Pharmacist  Pager: [phone]  01/28/2021 10:04 AM

## 2021-01-28 NOTE — Progress Notes (Signed)
Hypoglycemic Event  CBG: 48  Treatment: 4 oz juice, [place] sandwich  Symptoms: none  Follow-up CBG: Time:1421 CBG Result:83  Possible Reasons for Event:  Delay in CBG check on Endotool  Comments/MD notified:  Dr. Para March per Tressia Danas, RN Diabetes Coordinator in conversation regarding plan of care    Salena Saner, RN 14:36  01/28/2021

## 2021-01-28 NOTE — Progress Notes (Signed)
Occupational Therapy Treatment Patient Details Name: Ashley Camacho MRN: [id number] DOB: 10/03/1990 Today's Date: 01/28/2021    History of present illness 30 y.o. 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 Medical history significant of [redacted]w[redacted]d on presentation, Estimated Date of Delivery: 03/30/21 by by 9 week ultrasound however trying to make it to 34w for planned c-section, Type 1 diabetic, Cardiomyopathy, chronic systolic heart failure, DVT in bilateral arm. H/o recent hospitalizations d/t hyperemesis and sepsis secondary to pneumonia.   OT comments  Pt needing assist to don socks and for front opening gown prior to ambulating in hall with rollator. Pt able to push IV pole and manage toileting modified independently. Reports tightness with LE swelling. Continues to be interactive and understands medical situation.   Follow Up Recommendations  No OT follow up;Supervision/Assistance - 24 hour    Equipment Recommendations  None recommended by OT    Recommendations for Other Services      Precautions / Restrictions Precautions Precaution Comments: swelling in LEs, pregnancy       Mobility Bed Mobility Overal bed mobility: Modified Independent                  Transfers Overall transfer level: Modified independent Equipment used: 4-wheeled walker             General transfer comment: slow, but no assist    Balance                                           ADL either performed or assessed with clinical judgement   ADL Overall ADL's : Needs assistance/impaired     Grooming: Wash/dry hands;Standing;Modified independent           Upper Body Dressing : Minimal assistance;Sitting Upper Body Dressing Details (indicate cue type and reason): front opening gown Lower Body Dressing: Maximal assistance;Sitting/lateral leans Lower Body  Dressing Details (indicate cue type and reason): for socks Toilet Transfer: Modified Independent;Ambulation   Toileting- Clothing Manipulation and Hygiene: Modified independent;Sit to/from stand       Functional mobility during ADLs: Supervision/safety;Rolling walker (in hall, mod I pushing IV pole in room)       Vision       Perception     Praxis      Cognition Arousal/Alertness: Awake/alert Behavior During Therapy: WFL for tasks assessed/performed Overall Cognitive Status: Within Functional Limits for tasks assessed                                          Exercises     Shoulder Instructions       General Comments      Pertinent Vitals/ Pain       Pain Assessment: Faces Faces Pain Scale: Hurts little more Pain Location: LEs Pain Descriptors / Indicators: Tightness Pain Intervention(s): Monitored during session;Repositioned  Home Living                                          Prior Functioning/Environment              Frequency  Min 2X/week  Progress Toward Goals  OT Goals(current goals can now be found in the care plan section)  Progress towards OT goals: Progressing toward goals  Acute Rehab OT Goals Patient Stated Goal: have a healthy baby OT Goal Formulation: With patient Time For Goal Achievement: 01/29/21 Potential to Achieve Goals: Good  Plan Discharge plan remains appropriate    Co-evaluation                 AM-PAC OT "6 Clicks" Daily Activity     Outcome Measure   Help from another person eating meals?: None Help from another person taking care of personal grooming?: None Help from another person toileting, which includes using toliet, bedpan, or urinal?: None Help from another person bathing (including washing, rinsing, drying)?: A Little Help from another person to put on and taking off regular upper body clothing?: A Little Help from another person to put on and taking off  regular lower body clothing?: A Little 6 Click Score: 21    End of Session Equipment Utilized During Treatment: Rolling walker  OT Visit Diagnosis: Muscle weakness (generalized) (M62.81);Pain;Feeding difficulties (R63.3)   Activity Tolerance Patient tolerated treatment well   Patient Left in bed;with call bell/phone within reach   Nurse Communication Mobility status        Time: 1120-1200 OT Time Calculation (min): 40 min  Charges: OT General Charges $OT Visit: 1 Visit OT Treatments $Self Care/Home Management : 8-22 mins $Therapeutic Activity: 8-22 mins  Martie Round, OTR/L Acute Rehabilitation Services Pager: [phone] Office: [phone]    [name] 01/28/2021, 12:19 PM

## 2021-01-28 NOTE — Progress Notes (Signed)
FACULTY PRACTICE ANTEPARTUM COMPREHENSIVE PROGRESS NOTE  Ashley Camacho is a 30 y.o. G2P0101 at [redacted]w[redacted]d who is admitted for Complications of T1DM in pregnancy.  Estimated Date of Delivery: 03/30/21 Fetal presentation is cephalic.  Length of Stay:  43 Days. Admitted 12/16/2020  Subjective:  No changes for how she is doing. She is tolerating PO. She is ambulatory. She denies pain. Denies emesis. She denies HA/BV/RUQ pain.   Patient reports good fetal movement.  She reports no uterine contractions, no bleeding and no loss of fluid per vagina.  Vitals:  Blood pressure (!) 147/71, pulse 88, temperature 98.3 F (36.8 C), temperature source Oral, resp. rate 16, height 5\' 1"  (1.549 m), weight 80.6 kg, SpO2 99 %. Physical Examination: CONSTITUTIONAL: Well-developed, well-nourished female in no acute distress.  NEUROLOGIC: Alert and oriented to person, place, and time. No cranial nerve deficit noted. PSYCHIATRIC: Normal mood and affect. Normal behavior. Normal judgment and thought content. CARDIOVASCULAR: Normal heart rate noted, regular rhythm RESPIRATORY: Effort and breath sounds normal, no problems with respiration noted MUSCULOSKELETAL: Normal range of motion. No edema and no tenderness. 2+ distal pulses. ABDOMEN: Soft, nontender, nondistended, gravid. CERVIX: 2/60/-3 by me yesterday (no change from prior exam, also by me)  Fetal monitoring: FHR: 140 bpm, Variability: moderate, Accelerations: Present, Decelerations: Absent  Uterine activity: Q2-3 min  CBC Latest Ref Rng & Units 01/28/2021 01/27/2021 01/26/2021  WBC 4.0 - 10.5 K/uL 8.2 10.1 -  Hemoglobin 12.0 - 15.0 g/dL 9.0(L) 9.0(L) 6.7(LL)  Hematocrit 36.0 - 46.0 % 27.3(L) 26.9(L) 21.3(L)  Platelets 150 - 400 K/uL 144(L) 147(L) -   CMP Latest Ref Rng & Units 01/28/2021 01/27/2021 01/26/2021  Glucose 70 - 99 mg/dL 92 91 03/28/2021)  BUN 6 - 20 mg/dL 161(W) 96(E) 20  Creatinine 0.44 - 1.00 mg/dL 45(W) 0.98(J) 1.91(Y)  Sodium 135 - 145 mmol/L 135 135  134(L)  Potassium 3.5 - 5.1 mmol/L 3.3(L) 3.9 4.0  Chloride 98 - 111 mmol/L 101 104 98  CO2 22 - 32 mmol/L 26 24 24   Calcium 8.9 - 10.3 mg/dL 7.82(N) 8.3(L) 8.8(L)  Total Protein 6.5 - 8.1 g/dL 4.6(L) 4.8(L) -  Total Bilirubin 0.3 - 1.2 mg/dL 0.9 0.9 -  Alkaline Phos 38 - 126 U/L 85 90 -  AST 15 - 41 U/L 62(H) 59(H) -  ALT 0 - 44 U/L 92(H) 82(H) -    MFM FETAL BPP WO NON STRESS  Result Date: 01/28/2021 ----------------------------------------------------------------------  OBSTETRICS REPORT                        (Signed Final 01/28/2021 09:44 am) ---------------------------------------------------------------------- Patient Info  ID #:       [id number]                          D.O.B.:  April 11, 1991 (29 yrs)  Name:       Ashley Camacho                 Visit Date: 01/27/2021 05:14 pm ---------------------------------------------------------------------- Performed By  Attending:        RUSSELLVILLE Camacho      Ref. Address:      [facility]                                                              [address]                                                              [address]                                                              [address]                                                              [place]                                                              [phone]  Performed By:     Percell BostonHeather Waken          [facility]  Referred By:      Linzie CollinAVID JAMES                    [name] ---------------------------------------------------------------------- Orders  #  Description                           Code        Ordered By  1  US MFM FETAL BPP WO NON               [id number]    Marckus Hanover     STRESS ----------------------------------------------------------------------  #  Order #                      Accession #                Episode #  1  [id number]                   [id number]                 [id number] ---------------------------------------------------------------------- Indications  [redacted] weeks gestation of pregnancy                 Z3A.31  Encounter for antenatal screening,              Z36.9  unspecified  Polyhydramnios, third trimester, antepartum     O40.3XX0  condition or complication, unspecified fetus  Pre-existing diabetes, type 1, in pregnancy,    O24.013  third trimester  Hyperemesis gravidarum                          O21.0  Anemia during pregnancy in third trimester      O99.013  Abnormal fetal ultrasound (? liver              O28.9  calcification) ---------------------------------------------------------------------- Fetal Evaluation  Num Of Fetuses:          1  Fetal Heart Rate(bpm):   137  Cardiac Activity:        Observed  Presentation:            Cephalic  Amniotic Fluid  AFI FV:      Subjectively upper-normal  AFI Sum(cm)     %Tile       Largest Pocket(cm)  20.1            77          6.2  RUQ(cm)       RLQ(cm)       LUQ(cm)        LLQ(cm)  6.2           3.6           5.7            4.6 ---------------------------------------------------------------------- Biophysical Evaluation  Amniotic F.V:   Increased                  F. Tone:         Observed  F. Movement:    Observed                   Score:           8/8  F. Breathing:   Observed ---------------------------------------------------------------------- Gestational Age  Best:          31w 1d     Det. ByMarcella Dubs         EDD:   03/30/21                                      (08/27/20) ---------------------------------------------------------------------- Anatomy  Thoracic:              Appears normal         Bladder:                Appears normal ---------------------------------------------------------------------- Impression  Antenatal testing performed given maternal T1DM  The biophysical profile was 8/8 with good fetal movement  and  amniotic fluid volume. ---------------------------------------------------------------------- Recommendations  Continue weekly testing  Growth in 2 weeks. ----------------------------------------------------------------------               Lin Landsman, MD Electronically Signed Final Report   01/28/2021 09:44 am ----------------------------------------------------------------------   Current scheduled medications  sodium chloride   Intravenous Once   Chlorhexidine Gluconate Cloth  6 each Topical Daily   cholecalciferol  1,000 Units Oral Daily   vitamin B-6  12.5 mg  Oral BID   And   doxylamine (Sleep)  12.5 mg Oral BID   DULoxetine  20 mg Oral Daily   famotidine  20 mg Oral BID   feeding supplement (GLUCERNA SHAKE)  237 mL Oral BID BM   furosemide  40 mg Intravenous Daily   hydrALAZINE  75 mg Oral Q8H   hydrocortisone sod succinate (SOLU-CORTEF) inj  40 mg Intravenous Daily   insulin aspart  0-6 Units Subcutaneous Q4H   insulin detemir  8 Units Subcutaneous Daily   mouth rinse  15 mL Mouth Rinse q12n4p   metoCLOPramide (REGLAN) injection  5 mg Intravenous Q6H   [START ON 01/29/2021] pantoprazole  40 mg Oral Daily   prenatal vitamin w/FE, FA  1 tablet Oral Q1200   sodium chloride  1 spray Each Nare Q2H   sodium chloride flush  10-40 mL Intracatheter Q12H   sucralfate  1 g Oral TID WC & HS   vitamin B-12  1,000 mcg Oral Daily    I have reviewed the patient's current medications.  ASSESSMENT: Active Problems:   Delivery with history of C-section   Anemia affecting pregnancy in second trimester   Acute kidney injury (HCC)   Acute deep vein thrombosis (DVT) of brachial vein of right upper extremity (HCC)   Gastroparesis due to DM (HCC)   Diabetic retinopathy (HCC)   Acute on chronic combined systolic and diastolic CHF (congestive heart failure) (HCC)   Anemia during pregnancy   Pre-existing type 1 diabetes mellitus during pregnancy in second trimester   History of preterm  delivery, currently pregnant   Leg edema   Acute esophagitis  PLAN: OB Care  - BPP weekly - yesterday 8/8, NST reactive - Growth q 4 wks, last completed 8/22 - S/p BMZ 8/23-24 - MFM following- see their prior note for details - Had PT ctxns over the weekend that resolved with Procardia x1  GI N/V/Esophagitis - Tolerating carb modified diet, Glucerna - Continue pepcid, protonix, carafate. Scheduled anti-emetic is Reglan and zofran - S/p EGD with GI on 8/24 Malnutrition - Dietician on board - B12 replacement per IM - On daily vitamin  Endo T1DM - on Endotool. Fluctuating needs and still needs IV dosing Adrenal Insufficiency - Diagnosed on 8/26 and on steroids - Will need stress dose steroids at the time of delivery  Neph/GU  - UOP remains adequate. Now s/p foley and voiding spontaneously  - s/p Amox for UTI - UCX ordered 9/6 but too contaminated. Reordered for today.   Heme Anemia - Work up per IM - Fecal occult positive- GI aware, in s/o pregnancy, will hold on colonoscopy at this time.   - If Hgb worsens and assessment/intervention is warranted may reconsider  - HgB appropriate after 2u PRBC over the weekend. Hgb this AM was 9.0   Right brachial VTE - diagnosed in July 2022 - Had been on Lovenox but due to progressing anemia and hematemesis, it was held - She is now on Heparin IV - level followed by pharmacy Cardio - HTN- currently on Hydralazine 75mg  q8hr - this is an increase from baseline of 25 mg Q8 - At this time, no clear evidence for SIPE, but she is high risk for this. We will continue to watch her closely for this. It is possible she is starting to develop some form of PreE based on her slowly elevating platelets. Discussed the patient's care with MFM today. They recommend recheck tomorrow of her labs, hold heparin GTT prior in order  to determine need for c-section. Spoke with anesthesia team, Dr. Darlin Drop. She recommends holding GTT 6 hours prior to labs, check a  PTT with them to ensure normalization.  - LFTs baseline is 25 for both AST and ALT. Platelets stable from yesterday but 144.  - s/p cardiology consult: CHF stable  Maternal Care - tylenol prn - encourage ambulation, PT/OT on board   Plan as outlined above, continue with supportive care with co-management per IM.  Goal for delivery @ 34wk via Repeat C-section and bilateral tubal sterilization if maternal/fetal well being remains stable. Medicaid tubal papers signed and on her paper chart.   Continue routine antenatal care.  Milas Hock, MD, FACOG Obstetrician & Gynecologist, [facility] for [facility], [facility]

## 2021-01-29 LAB — CBC
HCT: 27.6 % — ABNORMAL LOW (ref 36.0–46.0)
Hemoglobin: 9.2 g/dL — ABNORMAL LOW (ref 12.0–15.0)
MCH: 30.9 pg (ref 26.0–34.0)
MCHC: 33.3 g/dL (ref 30.0–36.0)
MCV: 92.6 fL (ref 80.0–100.0)
Platelets: 141 10*3/uL — ABNORMAL LOW (ref 150–400)
RBC: 2.98 MIL/uL — ABNORMAL LOW (ref 3.87–5.11)
RDW: 16.3 % — ABNORMAL HIGH (ref 11.5–15.5)
WBC: 7.6 10*3/uL (ref 4.0–10.5)
nRBC: 0 % (ref 0.0–0.2)

## 2021-01-29 LAB — TYPE AND SCREEN
ABO/RH(D): A POS
Antibody Screen: NEGATIVE

## 2021-01-29 LAB — GLUCOSE, CAPILLARY
Glucose-Capillary: 89 mg/dL (ref 70–99)
Glucose-Capillary: 192 mg/dL — ABNORMAL HIGH (ref 70–99)

## 2021-01-29 LAB — COMPREHENSIVE METABOLIC PANEL
ALT: 76 U/L — ABNORMAL HIGH (ref 0–44)
AST: 44 U/L — ABNORMAL HIGH (ref 15–41)
Albumin: 2.4 g/dL — ABNORMAL LOW (ref 3.5–5.0)
Alkaline Phosphatase: 76 U/L (ref 38–126)
Anion gap: 6 (ref 5–15)
BUN: 24 mg/dL — ABNORMAL HIGH (ref 6–20)
CO2: 27 mmol/L (ref 22–32)
Calcium: 8.3 mg/dL — ABNORMAL LOW (ref 8.9–10.3)
Chloride: 102 mmol/L (ref 98–111)
Creatinine, Ser: 1.17 mg/dL — ABNORMAL HIGH (ref 0.44–1.00)
GFR, Estimated: >60 mL/min (ref >60)
Glucose, Bld: 79 mg/dL (ref 70–99)
Potassium: 3.4 mmol/L — ABNORMAL LOW (ref 3.5–5.1)
Sodium: 135 mmol/L (ref 135–145)
Total Bilirubin: 0.8 mg/dL (ref 0.3–1.2)
Total Protein: 4.9 g/dL — ABNORMAL LOW (ref 6.5–8.1)

## 2021-01-29 LAB — HEPARIN LEVEL (UNFRACTIONATED): Heparin Unfractionated: 0.17 IU/mL — ABNORMAL LOW (ref 0.30–0.70)

## 2021-01-29 LAB — CULTURE, OB URINE

## 2021-01-29 LAB — APTT: aPTT: 29 seconds (ref 24–36)

## 2021-01-29 MED ORDER — HEPARIN (PORCINE) 25000 UT/250ML-% IV SOLN
1350.0000 [IU]/h | INTRAVENOUS | Status: DC
  Administered 2021-01-29: 1350 [IU]/h via INTRAVENOUS
  Filled 2021-01-29: qty 250

## 2021-01-29 MED ORDER — NIFEDIPINE 10 MG PO CAPS
10.0000 mg | ORAL_CAPSULE | Freq: Once | ORAL | Status: AC
  Administered 2021-01-29: 10 mg via ORAL
  Filled 2021-01-29: qty 1

## 2021-01-29 MED ORDER — HEPARIN (PORCINE) 25000 UT/250ML-% IV SOLN
1350.0000 [IU]/h | INTRAVENOUS | Status: DC
  Administered 2021-01-30 – 2021-02-01 (×4): 1500 [IU]/h via INTRAVENOUS
  Administered 2021-02-02: 1300 [IU]/h via INTRAVENOUS
  Administered 2021-02-02: 1500 [IU]/h via INTRAVENOUS
  Administered 2021-02-03 – 2021-02-06 (×4): 1300 [IU]/h via INTRAVENOUS
  Administered 2021-02-06 – 2021-02-07 (×2): 1350 [IU]/h via INTRAVENOUS
  Filled 2021-01-29 (×12): qty 250

## 2021-01-29 MED ORDER — HEPARIN BOLUS VIA INFUSION
2000.0000 [IU] | Freq: Once | INTRAVENOUS | Status: AC
  Administered 2021-01-29: 2000 [IU] via INTRAVENOUS
  Filled 2021-01-29: qty 2000

## 2021-01-29 MED ORDER — POTASSIUM CHLORIDE CRYS ER 20 MEQ PO TBCR
40.0000 meq | EXTENDED_RELEASE_TABLET | Freq: Every day | ORAL | Status: DC
  Administered 2021-01-29 – 2021-01-30 (×2): 40 meq via ORAL
  Filled 2021-01-29 (×2): qty 2

## 2021-01-29 NOTE — Progress Notes (Signed)
ANTICOAGULATION CONSULT NOTE - Follow Up Consult  Pharmacy Consult for Heparin Indication: Right brachial VTE in pregnancy  No Known Allergies  Patient Measurements: Height: 5\' 1"  (154.9 cm) Weight: 80.6 kg (177 lb 9.6 oz) IBW/kg (Calculated) : 47.8 Heparin Dosing Weight: 63.4kg  Vital Signs: Temp: 98.1 F (36.7 C) (09/08 1942) Temp Source: Oral (09/08 1942) BP: 135/56 (09/08 1942) Pulse Rate: 94 (09/08 1942)  Labs: Recent Labs    01/27/21 0417 01/28/21 0350 01/29/21 0810 01/29/21 1910  HGB 9.0* 9.0* 9.2*  --   HCT 26.9* 27.3* 27.6*  --   PLT 147* 144* 141*  --   APTT  --   --  29  --   HEPARINUNFRC 0.50 0.36  --  0.17*  CREATININE 1.14* 1.20* 1.17*  --     Estimated Creatinine Clearance: 68.2 mL/min (A) (by C-G formula based on SCr of 1.17 mg/dL (H)).   Medications:  Lovenox 7/26-8/26  Assessment: 29yo F at [redacted]w[redacted]d gestation restarted on heparin drip today (after 10 hour hold) for right brachial DVT. Pt with complex pregnancy. After assessment for possible delivery, heparin restarted at previous rate of 1350units/hr. 6 hour level subtherapeutic at 0.17. Will give bolus of 2000 units and increase drip to 1500 units/hr to target goal of 0.3-0.7. Goal of Therapy:  Heparin level 0.3-0.7 units/ml Monitor platelets by anticoagulation protocol: Yes   Plan:  Heparin bolus 2000 units x 1 Increase Heparin drip to 1500 units/hr (~ 2 units/kg.hr) Will plan to check heparin level in 6 hours ~ 0300 and adjust to target goal.  [redacted]w[redacted]d 01/29/2021,9:50 PM

## 2021-01-29 NOTE — Progress Notes (Addendum)
PROGRESS NOTE  Ashley Camacho Ashley Camacho DOB: Sep 12, 1990 DOA: 12/16/2020 PCP: Hildred Laser, MD  HPI/Recap of past 24 hours: Ashley Camacho is a 30 y.o. G2P0101 with a history of T1DM, chronic HFrEF who was admitted initially 6/30 to [facility] for nausea and vomiting subsequently transferred to [facility] for rhinovirus pneumonia, discharged 7/20 only to return 7/22 with starvation ketosis and brachial vein DVT. TPN thru PICC was started and ultimately the patient was transferred to [facility] for cortrack placement on 7/26. Further complications detailed below:  7/27 cardiology consulted for cardiomyopathy, PICC line inserted, tube feeding started. 7/29 having recurrent retention, Foley catheter inserted 7/30 PRBC transfused 8/5 PRBC transfused 8/16 cardiology signed off, no follow-up recommended 8/19 nephrology consulted, no follow-up recommended 8/20 PRBC transfused 8/21 rescue course of betamethasone 8/22 Foley catheter reinserted after removal due to retention. 8/23 GI consulted. MFM consulted, nephrology signed off. 8/24 underwent EGD shows severe esophagitis with mild oozing.   Dobbhoff removed patient on oral diet. 8/25 PICC line removed. 8/26 started on steroids for adrenal insufficiency. 8/27 2 episodes of hematemesis.  Started on PPI drip.  GI reconsulted.  Lovenox held. 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 8/30 PICC line reinserted due to poor IV access, difficulty with blood draws and need for IV heparin.   Ongoing diuresing. Required 2u PRBCs for progressive anemia in the absence of any further bleeding. Vomiting has resolved and po intake improving.   01/29/2021: She has no new complaints this morning.  She is tolerating a diet, no vomiting.  No bleeding.  Reports painful contractions.  Assessment/Plan: Active Problems:   Delivery with history of C-section   Acute kidney injury (HCC)   Hyperemesis   DKA, type 1, not at goal  [facility])   Acute deep vein thrombosis (DVT) of brachial vein of right upper extremity (HCC)   Vitreous hemorrhage of right eye (HCC)   Gastroparesis due to DM (HCC)   Diabetic retinopathy (HCC)   Acute on chronic combined systolic and diastolic CHF (congestive heart failure) (HCC)   Type 1 diabetes mellitus affecting pregnancy in second trimester, antepartum   HFrEF (heart failure with reduced ejection fraction) (HCC)   Anemia during pregnancy   Pre-existing type 1 diabetes mellitus during pregnancy in third trimester   History of preterm delivery, currently pregnant   Leg edema   Acute esophagitis   Pregnancy with 31 completed weeks gestation  Resolved hematemesis due to grade C esophagitis: Noted on EGD 8/24 by Dr. Orvan Falconer.  IV Protonix switched to oral on 01/28/2021. She is on 20 mg p.o. Pepcid twice daily, p.o. Protonix 40 mg daily, and Carafate. No plan to repeat endoscopic evaluation at this time.    Improving, elevated liver chemistries AST and ALT downtrending. Continue to closely monitor.   Improved hyperemesis, intractable nausea and vomiting, gastroparesis:  -Currently on scheduled reglan and prn zofran - Symptoms have improved.  - Push per oral intake which is increasing. I've encouraged her to push per oral intake as she is not currently limited by nausea or vomiting, in hopes that we will avoid reinsertion of core track/TPN.    Resolved, acute blood loss anemia on anemia of pregnancy:  s/p 7u PRBCs this hospitalization (last 2u 9/5) Hemoglobin is stable and uptrending this morning 9.2 from 9.0. No overt bleeding reported.  Adrenal insufficiency: ACTH stim test diagnostic.  - Continue hydrocortisone taper as ordered. Plan eventual dosing 20mg  qAM, 10mg  qPM.  - Will  need stress dosing at time of delivery per OB. Reasonable dosing initially could be hydrocortisone 50mg  q8h.    Right brachial vein DVT: Dx 12/07/20.  Seems to be recannulating based on repeat U/S.  -  Continue therapeutic anticoagulation with heparin. Since needing transfusion support on 9/5, will stay on heparin for now.   T1DM with gastroparesis and peripheral neuropathy: Brittle control due to sensitivity and inconsistent calorie inputs. HbA1c was 5.8% 11/21/2020 (impacted by mid-late pregnancy).  We will keep on insulin drip as she is approaching her delivery date. Appreciate diabetes coordinator's assistance.   Anasarca due to severe malnutrition related to hyperemesis as above as well as acute on chronic HFrEF, HTN:  Updated echo showed improved LVEF 45% now up to 50-55%. RV and pulmonary artery pressures wnl. Small pericardial effusion noted without tamponade. She is currently on IV Lasix 40 mg daily with I's and O's and daily weight.. Net I&O -4.9 L.  Hypokalemia, from renal losses on IV diuretic Replete as indicated Repeat serum potassium level and magnesium level in the morning.  HTN, stable: Blood pressure stable on current regimen. Continue p.o. hydralazine 75mg  q8h.  Continue to monitor for preeclampsia.  Continue to closely monitor vital signs.   Resolved acute urinary retention:  Right hydronephrosis is likely physiologic in this setting.  Voiding spontaneously without difficulty at this time.   Resolved nonoliguric AKI, prerenal in the setting of dehydration from poor oral intake, intake is now improved:  Creatinine is downtrending 1.17 from 1.20. Good urine output She is currently on IV Lasix 40 mg daily along with potassium replacement 40 mEq daily.  Can DC potassium supplement once she is off diuretics.     IUP:  - MFM recommends rLTCS at 34 weeks (9/20). Continue NSTs/BPP/serial growth scans. Leaving orders in this regard to MFM/OB.  NST on 98 was reactive, reassuring.  - Has received BMZ - Procardia given 9/5 and on 01/29/2021 for contractions.  - OB co-managing, appreciate OB/GYN's assistance.   Treated UTI: Treated with amoxicillin 8/16 - 8/22   Limited  venous access:  - PICC reinserted 8/30.   DVT prophylaxis: Heparin gtt Code Status: Full Family Communication: None at bedside Disposition Plan:  Status is: Inpatient   Remains inpatient appropriate because:IV treatments appropriate due to intensity of illness or inability to take PO   Dispo: The patient is from: Home              Anticipated d/c is to: Home              Patient currently is not medically stable to d/c.   Consultants:  OB/GYN Nephrology Cardiology GI    Objective: Vitals:   01/29/21 0402 01/29/21 0819 01/29/21 1141 01/29/21 1511  BP: 134/60 (!) 154/65 (!) 146/62 (!) 137/57  Pulse: 83 87 90 97  Resp: 18 18 18 18   Temp: 97.6 F (36.4 C) 98.2 F (36.8 C) (!) 97.5 F (36.4 C) 97.7 F (36.5 C)  TempSrc: Oral Oral Oral Oral  SpO2: 96% 98% 98% 98%  Weight:      Height:        Intake/Output Summary (Last 24 hours) at 01/29/2021 1531 Last data filed at 01/29/2021 1530 Gross per 24 hour  Intake 626.27 ml  Output 3275 ml  Net -2648.73 ml   Filed Weights   01/20/21 0605 01/24/21 0603 01/26/21 0530  Weight: 72 kg 81.7 kg 80.6 kg    Exam:  General: 30 y.o. year-old female well-developed well-nourished in no  acute stress.  She is alert and oriented x3.   Cardiovascular: Regular rate and rhythm no rubs or gallops.  No thyromegaly or JVD noted.   Respiratory: Clear to auscultation with no wheezing or rales.  Good inspiratory effort.   Abdomen: Gravid abdomen with normal bowel sounds.. Musculoskeletal: 1+ pitting edema lower extremities bilaterally.  2 out of 4 pulses in all 4 extremities.   Skin: No ulcerative lesions noted.  No rashes noted. Psychiatry: Mood is appropriate for condition and setting.   Data Reviewed: CBC: Recent Labs  Lab 01/23/21 1449 01/24/21 0555 01/26/21 0600 01/27/21 0417 01/28/21 0350 01/29/21 0810  WBC 11.0* 9.1  --  10.1 8.2 7.6  NEUTROABS  --   --   --   --  5.9  --   HGB 7.1* 7.2* 6.7* 9.0* 9.0* 9.2*  HCT 22.0* 21.7*  21.3* 26.9* 27.3* 27.6*  MCV 94.4 93.9  --  91.8 92.5 92.6  PLT 203 171  --  147* 144* 141*   Basic Metabolic Panel: Recent Labs  Lab 01/25/21 0630 01/26/21 0600 01/27/21 0417 01/28/21 0350 01/29/21 0810  NA 132* 134* 135 135 135  K 3.7 4.0 3.9 3.3* 3.4*  CL 103 98 104 101 102  CO2 24 24 24 26 27   GLUCOSE 357* 238* 91 92 79  BUN 18 20 24* 23* 24*  CREATININE 0.98 1.12* 1.14* 1.20* 1.17*  CALCIUM 7.9* 8.8* 8.3* 8.6* 8.3*  MG 1.7  --   --   --   --    GFR: Estimated Creatinine Clearance: 68.2 mL/min (A) (by C-G formula based on SCr of 1.17 mg/dL (H)). Liver Function Tests: Recent Labs  Lab 01/23/21 1449 01/27/21 0417 01/28/21 0350 01/29/21 0810  AST 54* 59* 62* 44*  ALT 58* 82* 92* 76*  ALKPHOS 78 90 85 76  BILITOT 0.6 0.9 0.9 0.8  PROT 4.5* 4.8* 4.6* 4.9*  ALBUMIN 2.3* 2.4* 2.3* 2.4*   No results for input(s): LIPASE, AMYLASE in the last 168 hours. No results for input(s): AMMONIA in the last 168 hours. Coagulation Profile: No results for input(s): INR, PROTIME in the last 168 hours. Cardiac Enzymes: No results for input(s): CKTOTAL, CKMB, CKMBINDEX, TROPONINI in the last 168 hours. BNP (last 3 results) No results for input(s): PROBNP in the last 8760 hours. HbA1C: No results for input(s): HGBA1C in the last 72 hours. CBG: Recent Labs  Lab 01/28/21 0616 01/28/21 1226 01/28/21 1359 01/28/21 1421 01/29/21 0455  GLUCAP 81 77 48* 83 89   Lipid Profile: No results for input(s): CHOL, HDL, LDLCALC, TRIG, CHOLHDL, LDLDIRECT in the last 72 hours. Thyroid Function Tests: No results for input(s): TSH, T4TOTAL, FREET4, T3FREE, THYROIDAB in the last 72 hours. Anemia Panel: No results for input(s): VITAMINB12, FOLATE, FERRITIN, TIBC, IRON, RETICCTPCT in the last 72 hours. Urine analysis:    Component Value Date/Time   COLORURINE AMBER (A) 01/10/2021 0131   APPEARANCEUR CLOUDY (A) 01/10/2021 0131   APPEARANCEUR Clear 09/18/2020 1153   LABSPEC 1.017 01/10/2021  0131   LABSPEC 1.026 03/01/2013 1844   PHURINE 5.0 01/10/2021 0131   GLUCOSEU NEGATIVE 01/10/2021 0131   GLUCOSEU >=500 03/01/2013 1844   HGBUR NEGATIVE 01/10/2021 0131   BILIRUBINUR NEGATIVE 01/10/2021 0131   BILIRUBINUR neg 10/23/2020 0953   BILIRUBINUR Negative 09/18/2020 1153   BILIRUBINUR Negative 03/01/2013 1844   KETONESUR NEGATIVE 01/10/2021 0131   PROTEINUR NEGATIVE 01/10/2021 0131   UROBILINOGEN 0.2 10/23/2020 0953   NITRITE NEGATIVE 01/10/2021 0131   LEUKOCYTESUR SMALL (  A) 01/10/2021 0131   LEUKOCYTESUR Negative 03/01/2013 1844   Sepsis Labs: @LABRCNTIP (procalcitonin:4,lacticidven:4)  ) Recent Results (from the past 240 hour(s))  OB Urine Culture     Status: Abnormal   Collection Time: 01/27/21 11:30 AM   Specimen: OB Clean Catch; Urine  Result Value Ref Range Status   Specimen Description OB CLEAN CATCH  Final   Special Requests NONE  Final   Culture (A)  Final    MULTIPLE SPECIES PRESENT, SUGGEST RECOLLECTION NO GROUP B STREP (S.AGALACTIAE) ISOLATED Performed at [facility], [address] [place], [place]    Report Status 01/28/2021 FINAL  Final  OB Urine Culture     Status: Abnormal   Collection Time: 01/28/21  2:37 PM   Specimen: OB Clean Catch; Urine  Result Value Ref Range Status   Specimen Description OB CLEAN CATCH  Final   Special Requests NONE  Final   Culture (A)  Final    MULTIPLE SPECIES PRESENT, SUGGEST RECOLLECTION NO GROUP B STREP (S.AGALACTIAE) ISOLATED Performed at [facility], [address], [place], [place]    Report Status 01/29/2021 FINAL  Final      Studies: No results found.  Scheduled Meds:  sodium chloride   Intravenous Once   Chlorhexidine Gluconate Cloth  6 each Topical Daily   cholecalciferol  1,000 Units Oral Daily   vitamin B-6  12.5 mg Oral BID   And   doxylamine (Sleep)  12.5 mg Oral BID   DULoxetine  20 mg Oral Daily   famotidine  20 mg Oral BID   feeding supplement (GLUCERNA  SHAKE)  237 mL Oral BID BM   furosemide  40 mg Intravenous Daily   hydrALAZINE  75 mg Oral Q8H   hydrocortisone sod succinate (SOLU-CORTEF) inj  40 mg Intravenous Daily   mouth rinse  15 mL Mouth Rinse q12n4p   metoCLOPramide (REGLAN) injection  5 mg Intravenous Q6H   pantoprazole  40 mg Oral Daily   prenatal vitamin w/FE, FA  1 tablet Oral Q1200   sodium chloride  1 spray Each Nare Q2H   sodium chloride flush  10-40 mL Intracatheter Q12H   sucralfate  1 g Oral TID WC & HS   vitamin B-12  1,000 mcg Oral Daily    Continuous Infusions:  dextrose 25 mL/hr at 01/29/21 1115   heparin 1,350 Units/hr (01/29/21 1231)   insulin 4 Units/hr (01/29/21 1500)     LOS: 44 days     03/31/21, MD [facility] Pager [phone]  If 7PM-7AM, please contact night-coverage [email or web address] Password [facility] 01/29/2021, 3:31 PM

## 2021-01-29 NOTE — Progress Notes (Signed)
ANTICOAGULATION CONSULT NOTE - Follow Up Consult  Pharmacy Consult for heparin Indication:  right brachial VTE  No Known Allergies  Patient Measurements: Height: 5\' 1"  (154.9 cm) Weight: 80.6 kg (177 lb 9.6 oz) IBW/kg (Calculated) : 47.8 Heparin Dosing Weight: 63.4 kg  Vital Signs: Temp: 97.5 F (36.4 C) (09/08 1141) Temp Source: Oral (09/08 1141) BP: 146/62 (09/08 1141) Pulse Rate: 90 (09/08 1141)  Labs: Recent Labs    01/27/21 0417 01/28/21 0350 01/29/21 0810  HGB 9.0* 9.0* 9.2*  HCT 26.9* 27.3* 27.6*  PLT 147* 144* 141*  APTT  --   --  29  HEPARINUNFRC 0.50 0.36  --   CREATININE 1.14* 1.20* 1.17*    Estimated Creatinine Clearance: 68.2 mL/min (A) (by C-G formula based on SCr of 1.17 mg/dL (H)).   Medications:  Scheduled:   sodium chloride   Intravenous Once   Chlorhexidine Gluconate Cloth  6 each Topical Daily   cholecalciferol  1,000 Units Oral Daily   vitamin B-6  12.5 mg Oral BID   And   doxylamine (Sleep)  12.5 mg Oral BID   DULoxetine  20 mg Oral Daily   famotidine  20 mg Oral BID   feeding supplement (GLUCERNA SHAKE)  237 mL Oral BID BM   furosemide  40 mg Intravenous Daily   hydrALAZINE  75 mg Oral Q8H   hydrocortisone sod succinate (SOLU-CORTEF) inj  40 mg Intravenous Daily   mouth rinse  15 mL Mouth Rinse q12n4p   metoCLOPramide (REGLAN) injection  5 mg Intravenous Q6H   pantoprazole  40 mg Oral Daily   prenatal vitamin w/FE, FA  1 tablet Oral Q1200   sodium chloride  1 spray Each Nare Q2H   sodium chloride flush  10-40 mL Intracatheter Q12H   sucralfate  1 g Oral TID WC & HS   vitamin B-12  1,000 mcg Oral Daily   Infusions:   dextrose 25 mL/hr at 01/29/21 1115   heparin     insulin 0.8 Units/hr (01/29/21 1030)    Assessment: 30 YO female currently 31 weeks 3 days pregnant being restarted on heparin drip (stopped at 0200 on 9/8) for right brachial DVT. Patient with intermittent contractions per MD note but no plans to deliver so  instructed to restart heparin drip. Of note, she is anemic and received 2 units pRBC over the weekend. No s/sx of bleeding noted. H/H 9.2/27.6 and platelets 141 this morning. Of note, aPTT is back at baseline at 29 seconds.   Goal of Therapy:  Anti-Xa level 0.6-1 units/ml 4hrs after LMWH dose given Monitor platelets by anticoagulation protocol: Yes   Plan:  Will plan to restart heparin infusion at 1350 units/hr as it has only been paused for ~10 hours.  Will plan to obtain heparin level in 6 hours and make dose adjustments accordingly.  10-07-2002 Ashley Camacho 01/29/2021,12:06 PM

## 2021-01-29 NOTE — Progress Notes (Signed)
Physical Therapy Treatment Patient Details Name: Ashley Camacho MRN: [id number] DOB: 1991-01-29 Today's Date: 01/29/2021    History of Present Illness 30 y.o. 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 Medical history significant of [redacted]w[redacted]d on presentation, Estimated Date of Delivery: 03/30/21 by by 9 week ultrasound however trying to make it to 34w for planned c-section, Type 1 diabetic, Cardiomyopathy, chronic systolic heart failure, DVT in bilateral arm. H/o recent hospitalizations d/t hyperemesis and sepsis secondary to pneumonia.    PT Comments    Pt making steady progress with incr ambulation distance.Will begin focusing on progressing away from walker.   Follow Up Recommendations  No PT follow up     Equipment Recommendations  None recommended by PT    Recommendations for Other Services       Precautions / Restrictions Precautions Precautions: Other (comment) Precaution Comments: swelling in LEs, pregnancy    Mobility  Bed Mobility Overal bed mobility: Modified Independent Bed Mobility: Supine to Sit;Sit to Supine     Supine to sit: Modified independent (Device/Increase time);HOB elevated Sit to supine: Modified independent (Device/Increase time);HOB elevated        Transfers Overall transfer level: Modified independent Equipment used: None;4-wheeled walker Transfers: Sit to/from Stand Sit to Stand: Modified independent (Device/Increase time)         General transfer comment: slow to rise  Ambulation/Gait Ambulation/Gait assistance: Supervision Gait Distance (Feet): 400 Feet Assistive device: 4-wheeled walker Gait Pattern/deviations: Step-through pattern;Wide base of support Gait velocity: decreased Gait velocity interpretation: 1.31 - 2.62 ft/sec, indicative of limited community ambulator General Gait Details: 3 seated rest breaks. supervision  for safety and line management   Stairs             Wheelchair Mobility    Modified Rankin (Stroke Patients Only)       Balance Overall balance assessment: Mild deficits observed, not formally tested                                          Cognition Arousal/Alertness: Awake/alert Behavior During Therapy: WFL for tasks assessed/performed Overall Cognitive Status: Within Functional Limits for tasks assessed                                        Exercises      General Comments General comments (skin integrity, edema, etc.): Pt reported being light headed during gait. BP 140's/60's when returned to room.      Pertinent Vitals/Pain Pain Assessment: Faces Faces Pain Scale: Hurts a little bit Pain Location: abdominal Pain Descriptors / Indicators: Cramping Pain Intervention(s): Monitored during session    Home Living                      Prior Function            PT Goals (current goals can now be found in the care plan section) Acute Rehab PT Goals Patient Stated Goal: have a healthy baby Progress towards PT goals: Progressing toward goals    Frequency    Min 3X/week      PT Plan Current plan remains appropriate    Co-evaluation              AM-PAC PT "6  Clicks" Mobility   Outcome Measure  Help needed turning from your back to your side while in a flat bed without using bedrails?: None Help needed moving from lying on your back to sitting on the side of a flat bed without using bedrails?: None Help needed moving to and from a bed to a chair (including a wheelchair)?: None Help needed standing up from a chair using your arms (e.g., wheelchair or bedside chair)?: None Help needed to walk in hospital room?: A Little Help needed climbing 3-5 steps with a railing? : A Little 6 Click Score: 22    End of Session   Activity Tolerance: Patient limited by fatigue Patient left: in bed;with call  bell/phone within reach Nurse Communication: Mobility status PT Visit Diagnosis: Other abnormalities of gait and mobility (R26.89);Muscle weakness (generalized) (M62.81)     Time: 6010-9323 PT Time Calculation (min) (ACUTE ONLY): 18 min  Charges:  $Gait Training: 8-22 mins                     [facility] PT Acute Rehabilitation Services Pager [phone] Office [phone]    Angelina Ok [facility] 01/29/2021, 12:29 PM

## 2021-01-29 NOTE — Progress Notes (Signed)
FACULTY PRACTICE ANTEPARTUM COMPREHENSIVE PROGRESS NOTE  Ashley Camacho is a 30 y.o. G2P0101 at 101w1d who is admitted for Complications of T1DM in pregnancy.  Estimated Date of Delivery: 03/30/21 Fetal presentation is cephalic.  Length of Stay:  44 Days. Admitted 12/16/2020  Subjective:  No changes for how she is doing. She is tolerating PO. She is ambulatory. She denies pain. Denies emesis. She denies HA/BV/RUQ pain.   Patient reports good fetal movement.  No bleeding and no loss of fluid per vagina. She reports painful but irregular contractions this morning.   Vitals:  Blood pressure (!) 154/65, pulse 87, temperature 97.6 F (36.4 C), temperature source Oral, resp. rate 18, height 5\' 1"  (1.549 m), weight 80.6 kg, SpO2 98 %. Physical Examination: CONSTITUTIONAL: Well-developed, well-nourished female in no acute distress.  NEUROLOGIC: Alert and oriented to person, place, and time. No cranial nerve deficit noted. PSYCHIATRIC: Normal mood and affect. Normal behavior. Normal judgment and thought content. CARDIOVASCULAR: Normal heart rate noted, regular rhythm RESPIRATORY: Effort and breath sounds normal, no problems with respiration noted MUSCULOSKELETAL: Normal range of motion. No edema and no tenderness. 2+ distal pulses. ABDOMEN: Soft, nontender, nondistended, gravid. CERVIX: 2/60/-3 by me yesterday (no change from prior exam, also by me)  Fetal monitoring: FHR: 140 bpm, Variability: moderate, Accelerations: Present, Decelerations: Absent  Uterine activity: Irregular  CBC Latest Ref Rng & Units 01/29/2021 01/28/2021 01/27/2021  WBC 4.0 - 10.5 K/uL 7.6 8.2 10.1  Hemoglobin 12.0 - 15.0 g/dL 03/29/2021) 9.3(Z) 1.6(R)  Hematocrit 36.0 - 46.0 % 27.6(L) 27.3(L) 26.9(L)  Platelets 150 - 400 K/uL 141(L) 144(L) 147(L)   CMP Latest Ref Rng & Units 01/29/2021 01/28/2021 01/27/2021  Glucose 70 - 99 mg/dL 79 92 91  BUN 6 - 20 mg/dL 03/29/2021) 93(Y) 10(F)  Creatinine 0.44 - 1.00 mg/dL 75(Z) 0.25(E) 5.27(P)   Sodium 135 - 145 mmol/L 135 135 135  Potassium 3.5 - 5.1 mmol/L 3.4(L) 3.3(L) 3.9  Chloride 98 - 111 mmol/L 102 101 104  CO2 22 - 32 mmol/L 27 26 24   Calcium 8.9 - 10.3 mg/dL 8.3(L) 8.6(L) 8.3(L)  Total Protein 6.5 - 8.1 g/dL 4.9(L) 4.6(L) 4.8(L)  Total Bilirubin 0.3 - 1.2 mg/dL 0.8 0.9 0.9  Alkaline Phos 38 - 126 U/L 76 85 90  AST 15 - 41 U/L 44(H) 62(H) 59(H)  ALT 0 - 44 U/L 76(H) 92(H) 82(H)    8.24(M MFM FETAL BPP WO NON STRESS  Result Date: 01/28/2021 ----------------------------------------------------------------------  OBSTETRICS REPORT                        (Signed Final 01/28/2021 09:44 am) ---------------------------------------------------------------------- Patient Info  ID #:       03/30/2021                          D.O.B.:  [birth year] (29 yrs)  Name:       Ashley Camacho [facility]                 Visit Date: 01/27/2021 05:14 pm ---------------------------------------------------------------------- Performed By  Attending:        [facility]      Ref. Address:      [facility]                                                              [address]                                                              [address]                                                              [address]                                                              [place]                                                              [address]  Performed By:     Percell Boston          [facility]                                      [facility]  Referred By:      Linzie Collin MD ---------------------------------------------------------------------- Orders  #  Description                           Code        Ordered By  1  [place] MFM FETAL BPP WO NON               91791.50    Bre[place]     STRESS  ----------------------------------------------------------------------  #  Order #                     Accession #                Episode #  1  [id number]                   [id number]                 [id number] ---------------------------------------------------------------------- Indications  [redacted] weeks gestation of pregnancy                 Z3A.31  Encounter for antenatal screening,              Z36.9  unspecified  Polyhydramnios, third trimester, antepartum     O40.3XX0  condition or complication, unspecified fetus  Pre-existing diabetes, type 1, in pregnancy,    O24.013  third trimester  Hyperemesis gravidarum                          O21.0  Anemia during pregnancy in third trimester      O99.013  Abnormal fetal ultrasound (? liver              O28.9  calcification) ---------------------------------------------------------------------- Fetal Evaluation  Num Of Fetuses:          1  Fetal Heart Rate(bpm):   137  Cardiac Activity:        Observed  Presentation:            Cephalic  Amniotic Fluid  AFI FV:      Subjectively upper-normal  AFI Sum(cm)     %Tile       Largest Pocket(cm)  20.1            77          6.2  RUQ(cm)       RLQ(cm)       LUQ(cm)        LLQ(cm)  6.2           3.6           5.7            4.6 ---------------------------------------------------------------------- Biophysical Evaluation  Amniotic F.V:   Increased                  F. Tone:         Observed  F. Movement:    Observed                   Score:           8/8  F. Breathing:   Observed ---------------------------------------------------------------------- Gestational Age  Best:          31w 1d     Det. ByMarcella Dubs         EDD:   03/30/21                                      (08/27/20) ---------------------------------------------------------------------- Anatomy  Thoracic:              Appears normal         Bladder:                Appears normal ---------------------------------------------------------------------- Impression   Antenatal testing performed given maternal T1DM  The biophysical profile was 8/8 with good fetal movement and  amniotic fluid volume. ---------------------------------------------------------------------- Recommendations  Continue weekly testing  Growth in 2 weeks. ----------------------------------------------------------------------               Lin Landsman, MD Electronically Signed Final Report   01/28/2021 09:44 am ----------------------------------------------------------------------   Current scheduled medications  sodium chloride   Intravenous Once   Chlorhexidine Gluconate Cloth  6 each Topical Daily   cholecalciferol  1,000 Units Oral Daily   vitamin B-6  12.5 mg  Oral BID   And   doxylamine (Sleep)  12.5 mg Oral BID   DULoxetine  20 mg Oral Daily   famotidine  20 mg Oral BID   feeding supplement (GLUCERNA SHAKE)  237 mL Oral BID BM   furosemide  40 mg Intravenous Daily   hydrALAZINE  75 mg Oral Q8H   hydrocortisone sod succinate (SOLU-CORTEF) inj  40 mg Intravenous Daily   mouth rinse  15 mL Mouth Rinse q12n4p   metoCLOPramide (REGLAN) injection  5 mg Intravenous Q6H   pantoprazole  40 mg Oral Daily   prenatal vitamin w/FE, FA  1 tablet Oral Q1200   sodium chloride  1 spray Each Nare Q2H   sodium chloride flush  10-40 mL Intracatheter Q12H   sucralfate  1 g Oral TID WC & HS   vitamin B-12  1,000 mcg Oral Daily    I have reviewed the patient's current medications.  ASSESSMENT: Active Problems:   Delivery with history of C-section   Anemia affecting pregnancy in second trimester   Acute kidney injury (HCC)   Acute deep vein thrombosis (DVT) of brachial vein of right upper extremity (HCC)   Gastroparesis due to DM (HCC)   Diabetic retinopathy (HCC)   Acute on chronic combined systolic and diastolic CHF (congestive heart failure) (HCC)   Anemia during pregnancy   Pre-existing type 1 diabetes mellitus during pregnancy in second trimester   History of preterm delivery,  currently pregnant   Leg edema   Acute esophagitis  PLAN: OB Care  - BPP weekly - Last on 9/6 - 8/8, NST reactive - Growth q 4 wks, last completed 8/22 - S/p BMZ 8/23-24 - MFM following- see their prior note for details - Has intermittent PT ctxns that improve with time/nifedipine. No cervix change when rechecked. Will ensure no ctxns and improvement with nifedipine and if so, she can resume diet, heparin gtt, etc.   GI N/V/Esophagitis - Tolerating carb modified diet, Glucerna - Currently NPO, but will like resume diet for lunch.  - Continue pepcid, protonix, carafate. Scheduled anti-emetic is Reglan and zofran - S/p EGD with GI on 8/24 Malnutrition - Dietician on board - B12 replacement per IM - On daily vitamin  Endo T1DM - on Endotool. Once stability assured with ctxns, will resume diet and plan for transition to Insulin per primary team and diabetes coordinator Adrenal Insufficiency - Diagnosed on 8/26 and on steroids - Will need stress dose steroids at the time of delivery  Neph/GU  - UOP remains adequate. Now s/p foley and voiding spontaneously  - s/p Amox for UTI - UCX ordered 9/6 but too contaminated. Reordered for today.   Heme Anemia - Work up per IM - Fecal occult positive- GI aware, in s/o pregnancy. No colonoscopy at this time - If Hgb worsens and assessment/intervention is warranted may reconsider  - HgB appropriate after 2u PRBC over the weekend. 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 May need to increase to 100mg  q8 - discussed with Dr. .  - At this time, no clear evidence for SIPE, but she is high risk for this. We will continue to watch her closely for this. Her labs improved so no  indication for  delivery at this time.  - If concern for delivery, anesthesia team recommends holding heparin drip 4-6 hours and check a PTT. If unable to do so due to emergency she would have general anesthesia.  - LFTs baseline is 25 for both AST and ALT. Platelets stable from yesterday at [id number]  - s/p cardiology consult: CHF stable  Maternal Care - tylenol prn - encourage ambulation, PT/OT on board   Plan as outlined above, continue with supportive care with co-management per IM.  Goal for delivery @ 34wk via Repeat C-section and bilateral tubal sterilization if maternal/fetal well being remains stable. Medicaid tubal papers signed and on her paper chart.   Continue routine antenatal care.  Milas HockPaula Kamdon Reisig, MD, FACOG Obstetrician & Gynecologist, [facility], [facility]

## 2021-01-30 LAB — GLUCOSE, CAPILLARY
Glucose-Capillary: 95 mg/dL (ref 70–99)
Glucose-Capillary: 72 mg/dL (ref 70–99)
Glucose-Capillary: 164 mg/dL — ABNORMAL HIGH (ref 70–99)
Glucose-Capillary: 89 mg/dL (ref 70–99)
Glucose-Capillary: 111 mg/dL — ABNORMAL HIGH (ref 70–99)

## 2021-01-30 LAB — CBC
HCT: 25.7 % — ABNORMAL LOW (ref 36.0–46.0)
HCT: 33.4 % — ABNORMAL LOW (ref 36.0–46.0)
Hemoglobin: 8.4 g/dL — ABNORMAL LOW (ref 12.0–15.0)
Hemoglobin: 10.8 g/dL — ABNORMAL LOW (ref 12.0–15.0)
MCH: 30.9 pg (ref 26.0–34.0)
MCH: 30.4 pg (ref 26.0–34.0)
MCHC: 32.7 g/dL (ref 30.0–36.0)
MCHC: 32.3 g/dL (ref 30.0–36.0)
MCV: 94.5 fL (ref 80.0–100.0)
MCV: 94.1 fL (ref 80.0–100.0)
Platelets: 113 10*3/uL — ABNORMAL LOW (ref 150–400)
Platelets: 113 10*3/uL — ABNORMAL LOW (ref 150–400)
RBC: 2.72 MIL/uL — ABNORMAL LOW (ref 3.87–5.11)
RBC: 3.55 MIL/uL — ABNORMAL LOW (ref 3.87–5.11)
RDW: 16.4 % — ABNORMAL HIGH (ref 11.5–15.5)
RDW: 16.3 % — ABNORMAL HIGH (ref 11.5–15.5)
WBC: 7.7 10*3/uL (ref 4.0–10.5)
WBC: 7 10*3/uL (ref 4.0–10.5)
nRBC: 0 % (ref 0.0–0.2)
nRBC: 0 % (ref 0.0–0.2)

## 2021-01-30 LAB — BASIC METABOLIC PANEL
Anion gap: 6 (ref 5–15)
BUN: 25 mg/dL — ABNORMAL HIGH (ref 6–20)
CO2: 27 mmol/L (ref 22–32)
Calcium: 8 mg/dL — ABNORMAL LOW (ref 8.9–10.3)
Chloride: 103 mmol/L (ref 98–111)
Creatinine, Ser: 1.14 mg/dL — ABNORMAL HIGH (ref 0.44–1.00)
GFR, Estimated: >60 mL/min (ref >60)
Glucose, Bld: 152 mg/dL — ABNORMAL HIGH (ref 70–99)
Potassium: 3.7 mmol/L (ref 3.5–5.1)
Sodium: 136 mmol/L (ref 135–145)

## 2021-01-30 LAB — AMNISURE RUPTURE OF MEMBRANE (ROM) NOT AT ARMC: Amnisure ROM: NEGATIVE

## 2021-01-30 LAB — HEPATIC FUNCTION PANEL
ALT: 62 U/L — ABNORMAL HIGH (ref 0–44)
AST: 36 U/L (ref 15–41)
Albumin: 2.3 g/dL — ABNORMAL LOW (ref 3.5–5.0)
Alkaline Phosphatase: 76 U/L (ref 38–126)
Bilirubin, Direct: 0.2 mg/dL (ref 0.0–0.2)
Indirect Bilirubin: 0.7 mg/dL (ref 0.3–0.9)
Total Bilirubin: 0.9 mg/dL (ref 0.3–1.2)
Total Protein: 4.6 g/dL — ABNORMAL LOW (ref 6.5–8.1)

## 2021-01-30 LAB — HEPARIN LEVEL (UNFRACTIONATED): Heparin Unfractionated: 0.43 IU/mL (ref 0.30–0.70)

## 2021-01-30 MED ORDER — TERBUTALINE SULFATE 1 MG/ML IJ SOLN
0.2500 mg | Freq: Once | INTRAMUSCULAR | Status: AC
  Administered 2021-01-30: 0.25 mg via SUBCUTANEOUS
  Filled 2021-01-30: qty 1

## 2021-01-30 MED ORDER — SODIUM CHLORIDE 0.9 % IV SOLN
2.0000 g | Freq: Four times a day (QID) | INTRAVENOUS | Status: DC
  Filled 2021-01-30: qty 2000

## 2021-01-30 MED ORDER — AMOXICILLIN 500 MG PO CAPS
500.0000 mg | ORAL_CAPSULE | Freq: Two times a day (BID) | ORAL | Status: DC
  Administered 2021-01-30 – 2021-02-01 (×5): 500 mg via ORAL
  Filled 2021-01-30 (×5): qty 1

## 2021-01-30 MED ORDER — ACETAMINOPHEN 325 MG PO TABS
650.0000 mg | ORAL_TABLET | Freq: Four times a day (QID) | ORAL | Status: DC | PRN
  Administered 2021-01-30 – 2021-02-04 (×8): 650 mg via ORAL
  Filled 2021-01-30 (×8): qty 2

## 2021-01-30 NOTE — Progress Notes (Signed)
PROGRESS NOTE  Ashley Camacho:[id number] DOB: [birth year] DOA: 12/16/2020 PCP: Hildred Laser, MD  HPI/Recap of past 24 hours: Ashley Camacho is a 30 y.o. G2P0101 with a history of T1DM, chronic HFrEF who was admitted initially 6/30 to [facility] for nausea and vomiting subsequently transferred to [facility] for rhinovirus pneumonia, discharged 7/20 only to return 7/22 with starvation ketosis and brachial vein DVT. TPN thru PICC was started and ultimately the patient was transferred to [facility] for cortrack placement on 7/26. Further complications detailed below:  7/27 cardiology consulted for cardiomyopathy, PICC line inserted, tube feeding started. 7/29 having recurrent retention, Foley catheter inserted 7/30 PRBC transfused 8/5 PRBC transfused 8/16 cardiology signed off, no follow-up recommended 8/19 nephrology consulted, no follow-up recommended 8/20 PRBC transfused 8/21 rescue course of betamethasone 8/22 Foley catheter reinserted after removal due to retention. 8/23 GI consulted. MFM consulted, nephrology signed off. 8/24 underwent EGD shows severe esophagitis with mild oozing.   Dobbhoff removed patient on oral diet. 8/25 PICC line removed. 8/26 started on steroids for adrenal insufficiency. 8/27 2 episodes of hematemesis.  Started on PPI drip.  GI reconsulted.  Lovenox held. 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 8/30 PICC line reinserted due to poor IV access, difficulty with blood draws and need for IV heparin. 9/9 IV Lasix stopped due to concern for dehydration.  Started on p.o. amoxicillin 500 mg twice daily x7-days by OB due to concern for UTI with hematuria.   01/30/2021: She noted hematuria overnight.  Discussed with Dr. Para March, this happened previously when there was concern for UTI.  Started on p.o. amoxicillin 500 mg twice daily x7 days by OB based upon urine culture results from 01/05/2021 (positive for greater than 100,000 colonies  of Enterococcus faecalis pansensitive.)  She was noted to be hypoglycemic this morning with CBG of 69, this was corrected with D50.  Assessment/Plan: Active Problems:   Delivery with history of C-section   Acute kidney injury (HCC)   Hyperemesis   DKA, type 1, not at goal [facility])   Acute deep vein thrombosis (DVT) of brachial vein of right upper extremity (HCC)   Vitreous hemorrhage of right eye (HCC)   Gastroparesis due to DM (HCC)   Diabetic retinopathy (HCC)   Acute on chronic combined systolic and diastolic CHF (congestive heart failure) (HCC)   Type 1 diabetes mellitus affecting pregnancy in second trimester, antepartum   HFrEF (heart failure with reduced ejection fraction) (HCC)   Anemia during pregnancy   Pre-existing type 1 diabetes mellitus during pregnancy in third trimester   History of preterm delivery, currently pregnant   Leg edema   Acute esophagitis   Pregnancy with 31 completed weeks gestation  Resolved hematemesis due to grade C esophagitis: Noted on EGD 8/24 by Dr. Orvan Falconer.  IV Protonix switched to oral on 01/28/2021. She is on 20 mg p.o. Pepcid twice daily, p.o. Protonix 40 mg daily, and Carafate, continue. Symptoms have resolved. No plan to repeat endoscopic evaluation at this time.    Hematuria with concern for UTI in pregnancy Urine culture ordered by OB, started on amoxicillin 500 mg twice daily x7 days by OB based upon urine culture results from 01/05/2021 (positive for greater than 100,000 colonies of Enterococcus faecalis pansensitive.) DC IV Lasix for now (9/9).  Hypoglycemia, due to poor oral intake Continue insulin drip and D10 W at 25 cc/h. Continue to encourage increase in oral intake. She denies having any nausea at this morning.  Chronic insomnia She is currently on Unisom 12.5 mg twice daily States she did not get much sleep last night  Improving, elevated liver chemistries AST and ALT levels are normalizing. Continue to closely monitor.    Improved hyperemesis, intractable nausea and vomiting, gastroparesis:  -Currently on scheduled reglan and prn zofran -No nausea, no reported vomiting. -Continue to encourage increase in oral intake.   Acute blood loss anemia in the setting of hematuria from 01/30/2021 superimposed by anemia of pregnancy:  s/p 7u PRBCs this hospitalization (last 2u 9/5) Hemoglobin downtrending this morning 8.4 from 9.2. Continue to closely monitor hemoglobin and transfuse with hemoglobin less than 8. If hematuria persists, repeat CBC this afternoon.  Acute thrombocytopenia Platelet count downtrending 113 from 171 on 01/24/2021. Continue to closely monitor  Adrenal insufficiency: ACTH stim test diagnostic.  - Continue hydrocortisone taper as ordered. Plan eventual dosing 20mg  qAM, 10mg  qPM.  - Will need stress dosing at time of delivery per OB. Reasonable dosing initially could be hydrocortisone 50mg  q8h.    Right brachial vein DVT: Dx 12/07/20.  Seems to be recannulating based on repeat U/S.  -Continue therapeutic anticoagulation with heparin drip.   T1DM with gastroparesis and peripheral neuropathy: Brittle control due to sensitivity and inconsistent calorie inputs. HbA1c was 5.8% 11/21/2020 (impacted by mid-late pregnancy).  We will keep on insulin drip and D10 W as she is approaching her delivery date. Appreciate diabetes coordinator's assistance. Avoid hypoglycemia.   Improved anasarca due to severe malnutrition related to hyperemesis as above as well as acute on chronic HFrEF, HTN:  Updated echo showed improved LVEF 45% now up to 50-55%. RV and pulmonary artery pressures wnl. Small pericardial effusion noted without tamponade. She is currently on IV Lasix 40 mg daily with I's and O's and daily weight.. Net I&O -4.9 L>> -3.8 L.  Resolved post repletion: Hypokalemia, from renal losses on IV diuretic, IV Lasix DC'd on 01/30/2021. Replete as indicated  HTN, stable: Blood pressure stable. Continue p.o.  hydralazine 75mg  q8h.  Continue to monitor for preeclampsia.  Continue to closely monitor vital signs.   Resolved acute urinary retention:  Right hydronephrosis is likely physiologic in this setting.  Voiding spontaneously without difficulty at this time.   Resolved nonoliguric AKI, prerenal in the setting of dehydration from poor oral intake, intake is now improved:  Creatinine is downtrending 1.14 from 1.17 from 1.20.  IUP:  - MFM recommends rLTCS at 34 weeks (9/20). Continue NSTs/BPP/serial growth scans. Leaving orders in this regard to MFM/OB.  NST on 9/8 was reactive, reassuring.  - Has received BMZ - Procardia given 9/5 and on 01/29/2021 for contractions.  - OB co-managing, appreciate OB/GYN's assistance.   Enterococcus faecalis UTI: Urine culture collected from 01/05/2021, pansensitive, treated with amoxicillin 8/16 - 8/22.   Limited venous access:  - PICC reinserted 8/30.   DVT prophylaxis: Heparin gtt Code Status: Full Family Communication: None at bedside Disposition Plan:  Status is: Inpatient   Remains inpatient appropriate because:IV treatments appropriate due to intensity of illness or inability to take PO   Dispo: The patient is from: Home              Anticipated d/c is to: Home with OB/GYN signs off.              Patient currently is not medically stable to d/c.   Consultants:  OB/GYN Nephrology Cardiology GI    Objective: Vitals:   01/29/21 2324 01/30/21 0343 01/30/21 0748 01/30/21 1207  BP: 127/77  138/62 (!) 132/56 (!) 141/65  Pulse: 85 86 79 85  Resp: 18 18 16 16   Temp: 98 F (36.7 C) 98.3 F (36.8 C) 98.4 F (36.9 C) 98.2 F (36.8 C)  TempSrc: Oral Oral Oral Oral  SpO2: 100% 100% 98% 99%  Weight:      Height:        Intake/Output Summary (Last 24 hours) at 01/30/2021 1302 Last data filed at 01/30/2021 1232 Gross per 24 hour  Intake 2564.99 ml  Output 1901 ml  Net 663.99 ml   Filed Weights   01/20/21 0605 01/24/21 0603 01/26/21 0530   Weight: 72 kg 81.7 kg 80.6 kg    Exam:  General: 30 y.o. year-old female pleasant well-developed well-nourished in no acute distress.  She is alert oriented x3.   Cardiovascular: Regular rate and rhythm no rubs or gallops.  No JVD or thyromegaly noted.   Respiratory: Clear to auscultation with no wheezes or rales.  Good inspiratory effort.   Abdomen: Gravid abdomen with normal bowel sounds. Musculoskeletal: Trace edema in lower extremities bilaterally.  2 out of 4 pulses in all 4 extremities.   Skin: No ulcerative lesions noted. Psychiatry: Mood is appropriate for condition and setting.   Data Reviewed: CBC: Recent Labs  Lab 01/24/21 0555 01/26/21 0600 01/27/21 0417 01/28/21 0350 01/29/21 0810 01/30/21 0916  WBC 9.1  --  10.1 8.2 7.6 7.7  NEUTROABS  --   --   --  5.9  --   --   HGB 7.2* 6.7* 9.0* 9.0* 9.2* 8.4*  HCT 21.7* 21.3* 26.9* 27.3* 27.6* 25.7*  MCV 93.9  --  91.8 92.5 92.6 94.5  PLT 171  --  147* 144* 141* 113*   Basic Metabolic Panel: Recent Labs  Lab 01/25/21 0630 01/26/21 0600 01/27/21 0417 01/28/21 0350 01/29/21 0810 01/30/21 0916  NA 132* 134* 135 135 135 136  K 3.7 4.0 3.9 3.3* 3.4* 3.7  CL 103 98 104 101 102 103  CO2 24 24 24 26 27 27   GLUCOSE 357* 238* 91 92 79 152*  BUN 18 20 24* 23* 24* 25*  CREATININE 0.98 1.12* 1.14* 1.20* 1.17* 1.14*  CALCIUM 7.9* 8.8* 8.3* 8.6* 8.3* 8.0*  MG 1.7  --   --   --   --   --    GFR: Estimated Creatinine Clearance: 70 mL/min (A) (by C-G formula based on SCr of 1.14 mg/dL (H)). Liver Function Tests: Recent Labs  Lab 01/23/21 1449 01/27/21 0417 01/28/21 0350 01/29/21 0810 01/30/21 0916  AST 54* 59* 62* 44* 36  ALT 58* 82* 92* 76* 62*  ALKPHOS 78 90 85 76 76  BILITOT 0.6 0.9 0.9 0.8 0.9  PROT 4.5* 4.8* 4.6* 4.9* 4.6*  ALBUMIN 2.3* 2.4* 2.3* 2.4* 2.3*   No results for input(s): LIPASE, AMYLASE in the last 168 hours. No results for input(s): AMMONIA in the last 168 hours. Coagulation Profile: No  results for input(s): INR, PROTIME in the last 168 hours. Cardiac Enzymes: No results for input(s): CKTOTAL, CKMB, CKMBINDEX, TROPONINI in the last 168 hours. BNP (last 3 results) No results for input(s): PROBNP in the last 8760 hours. HbA1C: No results for input(s): HGBA1C in the last 72 hours. CBG: Recent Labs  Lab 01/29/21 1613 01/30/21 0532 01/30/21 0739 01/30/21 0900 01/30/21 1054  GLUCAP 192* 95 72 164* 89   Lipid Profile: No results for input(s): CHOL, HDL, LDLCALC, TRIG, CHOLHDL, LDLDIRECT in the last 72 hours. Thyroid Function Tests: No results for input(s):  TSH, T4TOTAL, FREET4, T3FREE, THYROIDAB in the last 72 hours. Anemia Panel: No results for input(s): VITAMINB12, FOLATE, FERRITIN, TIBC, IRON, RETICCTPCT in the last 72 hours. Urine analysis:    Component Value Date/Time   COLORURINE AMBER (A) 01/10/2021 0131   APPEARANCEUR CLOUDY (A) 01/10/2021 0131   APPEARANCEUR Clear 09/18/2020 1153   LABSPEC 1.017 01/10/2021 0131   LABSPEC 1.026 03/01/2013 1844   PHURINE 5.0 01/10/2021 0131   GLUCOSEU NEGATIVE 01/10/2021 0131   GLUCOSEU >=500 03/01/2013 1844   HGBUR NEGATIVE 01/10/2021 0131   BILIRUBINUR NEGATIVE 01/10/2021 0131   BILIRUBINUR neg 10/23/2020 0953   BILIRUBINUR Negative 09/18/2020 1153   BILIRUBINUR Negative 03/01/2013 1844   KETONESUR NEGATIVE 01/10/2021 0131   PROTEINUR NEGATIVE 01/10/2021 0131   UROBILINOGEN 0.2 10/23/2020 0953   NITRITE NEGATIVE 01/10/2021 0131   LEUKOCYTESUR SMALL (A) 01/10/2021 0131   LEUKOCYTESUR Negative 03/01/2013 1844   Sepsis Labs: @LABRCNTIP (procalcitonin:4,lacticidven:4)  ) Recent Results (from the past 240 hour(s))  OB Urine Culture     Status: Abnormal   Collection Time: 01/27/21 11:30 AM   Specimen: OB Clean Catch; Urine  Result Value Ref Range Status   Specimen Description OB CLEAN CATCH  Final   Special Requests NONE  Final   Culture (A)  Final    MULTIPLE SPECIES PRESENT, SUGGEST RECOLLECTION NO GROUP B  STREP (S.AGALACTIAE) ISOLATED Performed at [facility], [address], [place], [place]    Report Status 01/28/2021 FINAL  Final  OB Urine Culture     Status: Abnormal   Collection Time: 01/28/21  2:37 PM   Specimen: OB Clean Catch; Urine  Result Value Ref Range Status   Specimen Description OB CLEAN CATCH  Final   Special Requests NONE  Final   Culture (A)  Final    MULTIPLE SPECIES PRESENT, SUGGEST RECOLLECTION NO GROUP B STREP (S.AGALACTIAE) ISOLATED Performed at [facility], [address] [place], [place]    Report Status 01/29/2021 FINAL  Final      Studies: No results found.  Scheduled Meds:  sodium chloride   Intravenous Once   amoxicillin  500 mg Oral BID   Chlorhexidine Gluconate Cloth  6 each Topical Daily   cholecalciferol  1,000 Units Oral Daily   vitamin B-6  12.5 mg Oral BID   And   doxylamine (Sleep)  12.5 mg Oral BID   DULoxetine  20 mg Oral Daily   famotidine  20 mg Oral BID   feeding supplement (GLUCERNA SHAKE)  237 mL Oral BID BM   hydrALAZINE  75 mg Oral Q8H   hydrocortisone sod succinate (SOLU-CORTEF) inj  40 mg Intravenous Daily   mouth rinse  15 mL Mouth Rinse q12n4p   metoCLOPramide (REGLAN) injection  5 mg Intravenous Q6H   pantoprazole  40 mg Oral Daily   potassium chloride  40 mEq Oral Daily   prenatal vitamin w/FE, FA  1 tablet Oral Q1200   sodium chloride  1 spray Each Nare Q2H   sodium chloride flush  10-40 mL Intracatheter Q12H   sucralfate  1 g Oral TID WC & HS   vitamin B-12  1,000 mcg Oral Daily    Continuous Infusions:  dextrose 25 mL/hr at 01/29/21 1115   heparin 1,500 Units/hr (01/30/21 0600)   insulin 1.2 Units/hr (01/30/21 1232)     LOS: 45 days     04/01/21, MD [facility] Pager [phone]  If 7PM-7AM, please contact night-coverage [email or web address] Password [email or web address]  01/30/2021, 1:02 PM

## 2021-01-30 NOTE — Progress Notes (Signed)
Date and time results received: 01/30/21 0734  Test: CBG Critical Value: 69 per CGM, 72 per POCT  Name of Provider Notified: Dr. Jolayne Panther notified at [phone].  Orders Received? Or Actions Taken?: Per endotool, pt was given 69mL of D50. CBG recheck was 75. Insulin line clamped per endotool. RN instructed to recheck CBG in 1 hour. No additional orders received from provider at this time.

## 2021-01-30 NOTE — Progress Notes (Signed)
ANTICOAGULATION CONSULT NOTE - Follow Up Consult  Pharmacy Consult for heparin Indication:  Right brachial VTE in pregnancy  No Known Allergies  Patient Measurements: Height: 5\' 1"  (154.9 cm) Weight: 80.6 kg (177 lb 9.6 oz) IBW/kg (Calculated) : 47.8 Heparin Dosing Weight: 63.4 kg  Vital Signs: Temp: 98 F (36.7 C) (09/08 2324) Temp Source: Oral (09/08 2324) BP: 127/77 (09/08 2324) Pulse Rate: 85 (09/08 2324)  Labs: Recent Labs    01/27/21 0417 01/28/21 0350 01/29/21 0810 01/29/21 1910 01/30/21 0306  HGB 9.0* 9.0* 9.2*  --   --   HCT 26.9* 27.3* 27.6*  --   --   PLT 147* 144* 141*  --   --   APTT  --   --  29  --   --   HEPARINUNFRC 0.50 0.36  --  0.17* 0.43  CREATININE 1.14* 1.20* 1.17*  --   --     Estimated Creatinine Clearance: 68.2 mL/min (A) (by C-G formula based on SCr of 1.17 mg/dL (H)).   Medications:  Lovenox 7/26/8/26  Assessment: 30 yo F at [redacted]w[redacted]d gestation on heparin drip for right brachial VTE.  Heparin was held on 9/8 for 10 hours to evaluate labs for need for delivery via c/s. Did not require delivery. Heparin drip restarted 9/8 and adjusted based on 6 hour level.  Level this AM is 0.43, within goal of 0.3-0.7.  Rate is 1500 units/hr.  Goal of Therapy:  Heparin level 0.3-0.7 units/ml Monitor platelets by anticoagulation protocol: Yes   Plan:  Heparin level within goal, continue heparin at current rate.  Continue to monitor H&H and platelets  Nichole Keltner Scarlett 01/30/2021,3:33 AM

## 2021-01-30 NOTE — Progress Notes (Signed)
FACULTY PRACTICE ANTEPARTUM COMPREHENSIVE PROGRESS NOTE  Ashley Camacho is a 30 y.o. G2P0101 at [redacted]w[redacted]d who is admitted for Complications of T1DM in pregnancy.  Estimated Date of Delivery: 03/30/21 Fetal presentation is cephalic.  Length of Stay:  45 Days. Admitted 12/16/2020  Subjective: This morning she notes lower pelvic pain, some left back pain and blood in her urine again (she also had this about 3 weeks ago).   She is tolerating PO. She is ambulatory. She denies pain. Denies emesis. She denies HA/BV/RUQ pain.   Patient reports good fetal movement.  No bleeding and no loss of fluid per vagina.   Vitals:  Blood pressure (!) 141/65, pulse 85, temperature 98.2 F (36.8 C), temperature source Oral, resp. rate 16, height 5\' 1"  (1.549 m), weight 80.6 kg, SpO2 99 %. Physical Examination: CONSTITUTIONAL: Well-developed, well-nourished female in no acute distress.  NEUROLOGIC: Alert and oriented to person, place, and time. No cranial nerve deficit noted. PSYCHIATRIC: Normal mood and affect. Normal behavior. Normal judgment and thought content. CARDIOVASCULAR: Normal heart rate noted, regular rhythm RESPIRATORY: Effort and breath sounds normal, no problems with respiration noted MUSCULOSKELETAL: Normal range of motion. No edema and no tenderness. 2+ distal pulses. ABDOMEN: Soft, nontender, nondistended, gravid. CERVIX: 2/60/-3 at last check  Fetal monitoring: FHR: 140 bpm, Variability: moderate, Accelerations: Present, Decelerations: Absent  Uterine activity: q2-25min  CBC Latest Ref Rng & Units 01/30/2021 01/29/2021 01/28/2021  WBC 4.0 - 10.5 K/uL 7.7 7.6 8.2  Hemoglobin 12.0 - 15.0 g/dL 03/30/2021) 1.0(C) 5.8(N)  Hematocrit 36.0 - 46.0 % 25.7(L) 27.6(L) 27.3(L)  Platelets 150 - 400 K/uL 113(L) 141(L) 144(L)   CMP Latest Ref Rng & Units 01/30/2021 01/29/2021 01/28/2021  Glucose 70 - 99 mg/dL 03/30/2021) 79 92  BUN 6 - 20 mg/dL 824(M) 35(T) 61(W)  Creatinine 0.44 - 1.00 mg/dL 43(X) 5.40(G) 8.67(Y)  Sodium  135 - 145 mmol/L 136 135 135  Potassium 3.5 - 5.1 mmol/L 3.7 3.4(L) 3.3(L)  Chloride 98 - 111 mmol/L 103 102 101  CO2 22 - 32 mmol/L 27 27 26   Calcium 8.9 - 10.3 mg/dL 8.0(L) 8.3(L) 8.6(L)  Total Protein 6.5 - 8.1 g/dL 4.6(L) 4.9(L) 4.6(L)  Total Bilirubin 0.3 - 1.2 mg/dL 0.9 0.8 0.9  Alkaline Phos 38 - 126 U/L 76 76 85  AST 15 - 41 U/L 36 44(H) 62(H)  ALT 0 - 44 U/L 62(H) 76(H) 92(H)    No results found.  Current scheduled medications  sodium chloride   Intravenous Once   amoxicillin  500 mg Oral BID   Chlorhexidine Gluconate Cloth  6 each Topical Daily   cholecalciferol  1,000 Units Oral Daily   vitamin B-6  12.5 mg Oral BID   And   doxylamine (Sleep)  12.5 mg Oral BID   DULoxetine  20 mg Oral Daily   famotidine  20 mg Oral BID   feeding supplement (GLUCERNA SHAKE)  237 mL Oral BID BM   hydrALAZINE  75 mg Oral Q8H   hydrocortisone sod succinate (SOLU-CORTEF) inj  40 mg Intravenous Daily   mouth rinse  15 mL Mouth Rinse q12n4p   metoCLOPramide (REGLAN) injection  5 mg Intravenous Q6H   pantoprazole  40 mg Oral Daily   prenatal vitamin w/FE, FA  1 tablet Oral Q1200   sodium chloride  1 spray Each Nare Q2H   sodium chloride flush  10-40 mL Intracatheter Q12H   sucralfate  1 g Oral TID WC & HS   vitamin B-12  1,000 mcg  Oral Daily    I have reviewed the patient's current medications.  ASSESSMENT: Active Problems:   Delivery with history of C-section   Anemia affecting pregnancy in second trimester   Acute kidney injury (HCC)   Acute deep vein thrombosis (DVT) of brachial vein of right upper extremity (HCC)   Gastroparesis due to DM (HCC)   Diabetic retinopathy (HCC)   Acute on chronic combined systolic and diastolic CHF (congestive heart failure) (HCC)   Anemia during pregnancy   Pre-existing type 1 diabetes mellitus during pregnancy in second trimester   History of preterm delivery, currently pregnant   Leg edema   Acute  esophagitis Hematuria Thrombocytopenia  PLAN: OB Care  - BPP weekly - Last on 9/6 - 8/8, cephalic, nl afi, NST reactive - Growth q 4 wks, last completed 8/22 - S/p BMZ 8/23-24 - Had Mag 1g (around 28-29w) x6h but stopped due to elevated Mag level - MFM following- see their prior note for details - Has intermittent Braxton Hicks ctxns - no indication to recheck cervix at this time  GI N/V/Esophagitis - Tolerating diet, Glucerna  - Continue pepcid, protonix, carafate. Scheduled anti-emetic is Reglan and zofran - S/p EGD with GI on 8/24 Malnutrition - Dietician on board - B12 replacement per IM - On daily vitamin  Endo T1DM - on Endotool. Once stability assured with ctxns, will resume diet and plan for transition to Insulin per primary team and diabetes coordinator Adrenal Insufficiency - Diagnosed on 8/26 and on steroids - Will need stress dose steroids at the time of delivery  Neph/GU  - s/p Amox for UTI E. Facaelis in the past that was pan sensitive. Will presume this is the cause for the hematuria again given her hisotry. Amox 500 bid started for 7d  - Lasix on hold per primary team  Heme Anemia - Work up per IM, was stable until hematuria. Recheck at 1700 planned - Fecal occult positive- GI aware, in s/o pregnancy. No colonoscopy at this time - If Hgb worsens and assessment/intervention is warranted may reconsider  - HgB appropriate after 2u PRBC over the weekend. Hgb remains stable  Right brachial VTE - diagnosed in July 2022 - Had been on Lovenox but due to progressing anemia and hematemesis, it was held - She is now on Heparin IV - level followed by pharmacy Thrombocytopenia - Discussed possible HITT with pharmacy - does not meet criteria currently - Stability in other parameters makes HELLP unlikely at this time (LFTs continue to normalize)  Cardio - HTN- currently on Hydralazine 75mg  q8hr - this is an increase from baseline of 25 mg Q8 - At this time, no clear  evidence for SIPE, but she is high risk for this. We will continue to watch her closely for this. LFTs continued to normalize - If concern for delivery, anesthesia team recommends holding heparin drip 4-6 hours and check a PTT. If unable to do so due to emergency she would have general anesthesia.   - s/p cardiology consult: CHF stable  Maternal Care - tylenol prn - encourage ambulation, PT/OT on board   Plan as outlined above, continue with supportive care with co-management per IM.  Goal for delivery @ 34wk via Repeat C-section and bilateral tubal sterilization if maternal/fetal well being remains stable. Medicaid tubal papers signed and on her paper chart. She and I discussed BTL again on Wednesday and she reiterated her desire for BTL. Will address again at the time of delivery.   Continue routine antenatal  care.  Milas Hock, MD, FACOG Obstetrician & Gynecologist, [facility] for [facility]

## 2021-01-30 NOTE — Plan of Care (Signed)
  Problem: Activity: Goal: Risk for activity intolerance will decrease Outcome: Progressing   Problem: Nutrition: Goal: Adequate nutrition will be maintained Outcome: Progressing   Problem: Nutritional: Goal: Maintenance of adequate nutrition will improve Outcome: Progressing   Problem: Education: Goal: Knowledge of disease or condition will improve Outcome: Completed/Met   Problem: Coping: Goal: Ability to adjust to condition or change in health will improve Outcome: Completed/Met

## 2021-01-30 NOTE — Progress Notes (Signed)
RN called to room at approximately 1655. Moderate amount of brown-tinged fluid noted on bed pad and pad. Pad was saturated with fluid. MD notified and MD placed order for amnisure. Pt states ctx pain of 4/10 and RN placed pt on monitor as well.

## 2021-01-31 LAB — HEPARIN LEVEL (UNFRACTIONATED): Heparin Unfractionated: 0.62 IU/mL (ref 0.30–0.70)

## 2021-01-31 LAB — GLUCOSE, CAPILLARY
Glucose-Capillary: 70 mg/dL (ref 70–99)
Glucose-Capillary: 77 mg/dL (ref 70–99)
Glucose-Capillary: 87 mg/dL (ref 70–99)
Glucose-Capillary: 129 mg/dL — ABNORMAL HIGH (ref 70–99)

## 2021-01-31 LAB — CBC
HCT: 25.1 % — ABNORMAL LOW (ref 36.0–46.0)
Hemoglobin: 8.1 g/dL — ABNORMAL LOW (ref 12.0–15.0)
MCH: 30.7 pg (ref 26.0–34.0)
MCHC: 32.3 g/dL (ref 30.0–36.0)
MCV: 95.1 fL (ref 80.0–100.0)
Platelets: 120 10*3/uL — ABNORMAL LOW (ref 150–400)
RBC: 2.64 MIL/uL — ABNORMAL LOW (ref 3.87–5.11)
RDW: 16.5 % — ABNORMAL HIGH (ref 11.5–15.5)
WBC: 7.8 10*3/uL (ref 4.0–10.5)
nRBC: 0 % (ref 0.0–0.2)

## 2021-01-31 LAB — POTASSIUM: Potassium: 3.9 mmol/L (ref 3.5–5.1)

## 2021-01-31 LAB — URINE CULTURE: Culture: NO GROWTH

## 2021-01-31 LAB — MAGNESIUM: Magnesium: 1.8 mg/dL (ref 1.7–2.4)

## 2021-01-31 MED ORDER — TERBUTALINE SULFATE 1 MG/ML IJ SOLN
0.2500 mg | Freq: Once | INTRAMUSCULAR | Status: DC
  Filled 2021-01-31 (×2): qty 1

## 2021-01-31 NOTE — Progress Notes (Signed)
Per tech, fasting glucose was 70. Patient drank apple juice, insulin turned off per endotool. 30 minutes later CGM read 62, checked with fingerstick. CBG machine read 77. Raelyn Ensign, RN

## 2021-01-31 NOTE — Progress Notes (Signed)
Patient ID: [name], female   DOB: 1990/06/19, 30 y.o.   MRN: [id number] FACULTY PRACTICE ANTEPARTUM(COMPREHENSIVE) NOTE  Ashley Camacho is a 30 y.o. G2P0101 with Estimated Date of Delivery: 03/30/21   By   [redacted]w[redacted]d  who is admitted for management of multiple medical problems in pregnancy  Fetal presentation is cephalic. Length of Stay:  46  Days  Date of admission:12/16/2020  Subjective: Some contractions overnight, S/P terb x 1 Patient reports the fetal movement as active. Patient reports uterine contraction  activity as irregular now. Patient reports  vaginal bleeding as none. Patient describes fluid per vagina as None.  Vitals:  Blood pressure (!) 144/62, pulse 94, temperature 98 F (36.7 C), temperature source Oral, resp. rate 17, height 5\' 1"  (1.549 m), weight 80.6 kg, SpO2 97 %. Vitals:   01/30/21 1207 01/30/21 1646 01/30/21 1944 01/31/21 0543  BP: (!) 141/65 (!) 149/73 (!) 135/50 (!) 144/62  Pulse: 85 86 (!) 115 94  Resp: 16 16 16 17   Temp: 98.2 F (36.8 C) 98.2 F (36.8 C) 97.6 F (36.4 C) 98 F (36.7 C)  TempSrc: Oral  Oral Oral  SpO2: 99% 99% 97% 97%  Weight:      Height:       Physical Examination:  General appearance - oriented to person, place, and time and chronically ill appearing Abdomen - soft, nontender, nondistended, no masses or organomegaly Fundal Height:  size equals dates Pelvic Exam:  examination not indicated Cervical Exam: Not evaluated.  Extremities: extremities normal, atraumatic, no cyanosis or edema with DTRs 2+ bilaterally Membranes:intact  Fetal Monitoring:  Baseline: 140 bpm, Variability: Good {> 6 bpm), Accelerations: Reactive, and Decelerations: Absent   reactive  Labs:  Results for orders placed or performed during the hospital encounter of 12/16/20 (from the past 24 hour(s))  Glucose, capillary   Collection Time: 01/30/21  9:00 AM  Result Value Ref Range   Glucose-Capillary 164 (H) 70 - 99 mg/dL  Basic metabolic panel    Collection Time: 01/30/21  9:16 AM  Result Value Ref Range   Sodium 136 135 - 145 mmol/L   Potassium 3.7 3.5 - 5.1 mmol/L   Chloride 103 98 - 111 mmol/L   CO2 27 22 - 32 mmol/L   Glucose, Bld 152 (H) 70 - 99 mg/dL   BUN 25 (H) 6 - 20 mg/dL   Creatinine, Ser 04/01/21 (H) 0.44 - 1.00 mg/dL   Calcium 8.0 (L) 8.9 - 10.3 mg/dL   GFR, Estimated 04/01/21 6.28 mL/min   Anion gap 6 5 - 15  CBC   Collection Time: 01/30/21  9:16 AM  Result Value Ref Range   WBC 7.7 4.0 - 10.5 K/uL   RBC 2.72 (L) 3.87 - 5.11 MIL/uL   Hemoglobin 8.4 (L) 12.0 - 15.0 g/dL   HCT >62 (L) 04/01/21 - 94.7 %   MCV 94.5 80.0 - 100.0 fL   MCH 30.9 26.0 - 34.0 pg   MCHC 32.7 30.0 - 36.0 g/dL   RDW 65.4 (H) 65.0 - 35.4 %   Platelets 113 (L) 150 - 400 K/uL   nRBC 0.0 0.0 - 0.2 %  Hepatic function panel   Collection Time: 01/30/21  9:16 AM  Result Value Ref Range   Total Protein 4.6 (L) 6.5 - 8.1 g/dL   Albumin 2.3 (L) 3.5 - 5.0 g/dL   AST 36 15 - 41 U/L   ALT 62 (H) 0 - 44 U/L   Alkaline Phosphatase 76 38 -  126 U/L   Total Bilirubin 0.9 0.3 - 1.2 mg/dL   Bilirubin, Direct 0.2 0.0 - 0.2 mg/dL   Indirect Bilirubin 0.7 0.3 - 0.9 mg/dL  Glucose, capillary   Collection Time: 01/30/21 10:54 AM  Result Value Ref Range   Glucose-Capillary 89 70 - 99 mg/dL  Glucose, capillary   Collection Time: 01/30/21  4:44 PM  Result Value Ref Range   Glucose-Capillary 111 (H) 70 - 99 mg/dL  CBC   Collection Time: 01/30/21  5:07 PM  Result Value Ref Range   WBC 7.0 4.0 - 10.5 K/uL   RBC 3.55 (L) 3.87 - 5.11 MIL/uL   Hemoglobin 10.8 (L) 12.0 - 15.0 g/dL   HCT 22.2 (L) 97.9 - 89.2 %   MCV 94.1 80.0 - 100.0 fL   MCH 30.4 26.0 - 34.0 pg   MCHC 32.3 30.0 - 36.0 g/dL   RDW 11.9 (H) 41.7 - 40.8 %   Platelets 113 (L) 150 - 400 K/uL   nRBC 0.0 0.0 - 0.2 %  Amnisure rupture of membrane (rom)not at [facility]   Collection Time: 01/30/21  5:52 PM  Result Value Ref Range   Amnisure ROM NEGATIVE   Heparin level (unfractionated)   Collection Time:  01/31/21  4:28 AM  Result Value Ref Range   Heparin Unfractionated 0.62 0.30 - 0.70 IU/mL  CBC   Collection Time: 01/31/21  4:28 AM  Result Value Ref Range   WBC 7.8 4.0 - 10.5 K/uL   RBC 2.64 (L) 3.87 - 5.11 MIL/uL   Hemoglobin 8.1 (L) 12.0 - 15.0 g/dL   HCT 14.4 (L) 81.8 - 56.3 %   MCV 95.1 80.0 - 100.0 fL   MCH 30.7 26.0 - 34.0 pg   MCHC 32.3 30.0 - 36.0 g/dL   RDW 14.9 (H) 70.2 - 63.7 %   Platelets 120 (L) 150 - 400 K/uL   nRBC 0.0 0.0 - 0.2 %  Glucose, capillary   Collection Time: 01/31/21  5:47 AM  Result Value Ref Range   Glucose-Capillary 70 70 - 99 mg/dL  Glucose, capillary   Collection Time: 01/31/21  6:35 AM  Result Value Ref Range   Glucose-Capillary 77 70 - 99 mg/dL    Imaging Studies:    [place] MFM FETAL BPP WO NON STRESS  Result Date: 01/28/2021 ----------------------------------------------------------------------  OBSTETRICS REPORT                        (Signed Final 01/28/2021 09:44 am) ---------------------------------------------------------------------- Patient Info  ID #:       [id number]                          D.O.B.:  05-26-1990 (30 yrs)  Name:       Ashley Camacho                 Visit Date: 01/27/2021 05:14 pm ---------------------------------------------------------------------- Performed By  Attending:        Lin Landsman      Ref. Address:      [facility]                                                              [address]                                                              [address]                                                              [address]                                                              [place]                                                              [id number]  Performed By:     Percell BostonHeather Waken          [facility]  Referred  By:      Ashley Camacho                    [name] ---------------------------------------------------------------------- Orders  #  Description                           Code        Ordered By  1  US MFM FETAL BPP WO NON               [id number]    PAULA DUNCAN     STRESS ----------------------------------------------------------------------  #  Order #                     Accession #                Episode #  1  [id number]                   [id number]                 [id number] ---------------------------------------------------------------------- Indications  [redacted] weeks gestation of pregnancy                 Z3A.31  Encounter for antenatal screening,              Z36.9  unspecified  Polyhydramnios, third trimester, antepartum     O40.3XX0  condition or complication, unspecified fetus  Pre-existing diabetes, type 1, in pregnancy,    O24.013  third trimester  Hyperemesis gravidarum                          O21.0  Anemia during pregnancy in third trimester      O99.013  Abnormal fetal ultrasound (? liver              O28.9  calcification) ---------------------------------------------------------------------- Fetal Evaluation  Num Of Fetuses:          1  Fetal Heart Rate(bpm):   137  Cardiac Activity:        Observed  Presentation:            Cephalic  Amniotic Fluid  AFI FV:      Subjectively upper-normal  AFI Sum(cm)     %Tile       Largest Pocket(cm)  20.1            77          6.2  RUQ(cm)       RLQ(cm)       LUQ(cm)        LLQ(cm)  6.2           3.6           5.7            4.6 ---------------------------------------------------------------------- Biophysical Evaluation  Amniotic F.V:   Increased                  F. Tone:         Observed  F. Movement:    Observed                   Score:           8/8  F. Breathing:   Observed ---------------------------------------------------------------------- Gestational Age  Best:          31w 1d     Det. ByMarcella Dubs         EDD:   03/30/21                                       (08/27/20) ---------------------------------------------------------------------- Anatomy  Thoracic:              Appears normal         Bladder:                Appears normal ---------------------------------------------------------------------- Impression  Antenatal testing performed given maternal T1DM  The biophysical profile was 8/8 with good fetal movement and  amniotic fluid volume. ---------------------------------------------------------------------- Recommendations  Continue weekly testing  Growth in 2 weeks. ----------------------------------------------------------------------               Lin Landsman, MD Electronically Signed Final Report   01/28/2021 09:44 am ----------------------------------------------------------------------    Medications:  Scheduled  sodium chloride   Intravenous Once   amoxicillin  500 mg Oral BID   Chlorhexidine Gluconate Cloth  6 each Topical Daily   cholecalciferol  1,000 Units  Oral Daily   vitamin B-6  12.5 mg Oral BID   And   doxylamine (Sleep)  12.5 mg Oral BID   DULoxetine  20 mg Oral Daily   famotidine  20 mg Oral BID   feeding supplement (GLUCERNA SHAKE)  237 mL Oral BID BM   hydrALAZINE  75 mg Oral Q8H   hydrocortisone sod succinate (SOLU-CORTEF) inj  40 mg Intravenous Daily   mouth rinse  15 mL Mouth Rinse q12n4p   metoCLOPramide (REGLAN) injection  5 mg Intravenous Q6H   pantoprazole  40 mg Oral Daily   prenatal vitamin w/FE, FA  1 tablet Oral Q1200   sodium chloride  1 spray Each Nare Q2H   sodium chloride flush  10-40 mL Intracatheter Q12H   sucralfate  1 g Oral TID WC & HS   terbutaline  0.25 mg Subcutaneous Once   vitamin B-12  1,000 mcg Oral Daily   I have reviewed the patient's current medications.  ASSESSMENT: G2P0101 [redacted]w[redacted]d Estimated Date of Delivery: 03/30/21  Patient Active Problem List   Diagnosis Date Noted   Pregnancy with 31 completed weeks gestation    Acute esophagitis    Leg edema    History of preterm  delivery, currently pregnant    Pre-existing type 1 diabetes mellitus during pregnancy in third trimester    Anemia during pregnancy    Type 1 diabetes mellitus affecting pregnancy in second trimester, antepartum    HFrEF (heart failure with reduced ejection fraction) (HCC)    Acute deep vein thrombosis (DVT) of brachial vein of right upper extremity (HCC) 12/17/2020   Anxiety 12/17/2020   Diabetic retinopathy (HCC) 12/17/2020   Acute on chronic combined systolic and diastolic CHF (congestive heart failure) (HCC)    Hyperemesis 12/13/2020   DKA, type 1, not at goal [facility]) 12/13/2020   Sunburn 12/13/2020   Hypothyroidism 12/10/2020   Malnutrition (HCC) 12/10/2020   Vitreous hemorrhage of right eye (HCC) 12/10/2020   Pleural effusion associated with pulmonary infection 11/25/2020   Pneumonia affecting pregnancy 11/24/2020   Diabetes mellitus affecting pregnancy, second trimester 11/24/2020   Edema 11/24/2020   Decreased urine output 11/24/2020   Shortness of breath    Zinc deficiency    SOB (shortness of breath)    Social problem 11/21/2020   Nausea and vomiting during pregnancy prior to [redacted] weeks gestation 11/21/2020   Low serum vitamin B12    Delivery with history of C-section 11/20/2020   Absolute anemia    Acute kidney injury (HCC)    Nausea and vomiting during pregnancy 10/23/2020   History of premature delivery, currently pregnant, second trimester 10/13/2020   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   Depressive disorder 09/28/2018   Neuropathy 09/28/2018   Gastroparesis due to DM (HCC) 04/22/2016   Diabetic sensorimotor neuropathy (HCC) 04/19/2016   DKA (diabetic ketoacidoses) 04/03/2016   Microalbuminuria 09/26/2005    PLAN: no change in care plan OB Care             - BPP weekly - Last on 9/6 - 8/8, cephalic, nl afi, NST reactive - Growth q 4 wks, last completed 8/22 - S/p BMZ 8/23-24 - Had Mag 1g (around 28-29w) x6h but stopped due to elevated Mag level - MFM following-  see their prior note for details - Has intermittent Braxton Hicks ctxns - no indication to recheck cervix at this time   GI N/V/Esophagitis - Tolerating diet, Glucerna  - Continue pepcid, protonix, carafate. Scheduled anti-emetic is Reglan and zofran -  S/p EGD with GI on 8/24 Malnutrition - Dietician on board - B12 replacement per IM - On daily vitamin   Endo T1DM - on Endotool. 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 Facaelis in the past that was pan sensitive. Will presume this is the cause for the hematuria again given her hisotry. Amox 500 bid started for 7d             - Lasix on hold per primary team   Heme Anemia - Work up per IM, was stable until hematuria. Recheck at 1700 planned - Fecal occult positive- GI aware, in s/o pregnancy. No colonoscopy at this time - If Hgb worsens and assessment/intervention is warranted may reconsider  - HgB appropriate after 2u PRBC over the weekend. 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 We will continue to watch her closely for this. LFTs continued to normalize - If concern for delivery, anesthesia team recommends holding heparin drip 4-6 hours and check a PTT. If unable to do  so due to emergency she would have general anesthesia.    - s/p cardiology consult: CHF stable   Maternal Care - tylenol prn - encourage ambulation, PT/OT on board   Plan as outlined above, continue with supportive care with co-management per IM.  Goal for delivery @ 34wk via Repeat C-section and bilateral tubal sterilization if maternal/fetal well being remains stable. Medicaid tubal papers signed and on her paper chart. She and I discussed BTL again on Wednesday and she reiterated her desire for BTL. Will address again at the time of delivery.    Continue routine antenatal care.  Gentle Hoge H Clea Dubach 01/31/2021,7:49 AM

## 2021-01-31 NOTE — Progress Notes (Signed)
ANTICOAGULATION CONSULT NOTE - Follow Up Consult  Pharmacy Consult for heparin Indication:  right brachial VTE in pregnancy  No Known Allergies  Patient Measurements: Height: 5\' 1"  (154.9 cm) Weight: 80.6 kg (177 lb 9.6 oz) IBW/kg (Calculated) : 47.8 Heparin Dosing Weight: 63.4 kg   Vital Signs: Temp: 98 F (36.7 C) (09/10 0543) Temp Source: Oral (09/10 0543) BP: 144/62 (09/10 0543) Pulse Rate: 94 (09/10 0543)  Labs: Recent Labs    01/29/21 0810 01/29/21 1910 01/30/21 0306 01/30/21 0916 01/30/21 1707 01/31/21 0428  HGB 9.2*  --   --  8.4* 10.8* 8.1*  HCT 27.6*  --   --  25.7* 33.4* 25.1*  PLT 141*  --   --  113* 113* 120*  APTT 29  --   --   --   --   --   HEPARINUNFRC  --  0.17* 0.43  --   --  0.62  CREATININE 1.17*  --   --  1.14*  --   --     Estimated Creatinine Clearance: 70 mL/min (A) (by C-G formula based on SCr of 1.14 mg/dL (H)).   Medications:  Scheduled:   sodium chloride   Intravenous Once   amoxicillin  500 mg Oral BID   Chlorhexidine Gluconate Cloth  6 each Topical Daily   cholecalciferol  1,000 Units Oral Daily   vitamin B-6  12.5 mg Oral BID   And   doxylamine (Sleep)  12.5 mg Oral BID   DULoxetine  20 mg Oral Daily   famotidine  20 mg Oral BID   feeding supplement (GLUCERNA SHAKE)  237 mL Oral BID BM   hydrALAZINE  75 mg Oral Q8H   hydrocortisone sod succinate (SOLU-CORTEF) inj  40 mg Intravenous Daily   mouth rinse  15 mL Mouth Rinse q12n4p   metoCLOPramide (REGLAN) injection  5 mg Intravenous Q6H   pantoprazole  40 mg Oral Daily   prenatal vitamin w/FE, FA  1 tablet Oral Q1200   sodium chloride  1 spray Each Nare Q2H   sodium chloride flush  10-40 mL Intracatheter Q12H   sucralfate  1 g Oral TID WC & HS   terbutaline  0.25 mg Subcutaneous Once   vitamin B-12  1,000 mcg Oral Daily   Infusions:   dextrose 25 mL/hr (01/30/21 2216)   heparin 1,500 Units/hr (01/30/21 2216)   insulin Stopped (01/31/21 0555)    Assessment: 30 yo F at  [redacted]w[redacted]d gestation on heparin drip for right brachial VTE. Platelets today are 120, low but stable. Level this AM at 04:28 is therapeutic at 0.62 units/mL.   Goal of Therapy:  Heparin level 0.3-0.7 units/ml Monitor platelets by anticoagulation protocol: Yes   Plan: Continue heparin at 1500 units/hr. Monitor for delivery and when to stop heparin. Monitor heparin level daily. Continue to monitor H&H and platelets  [redacted]w[redacted]d Raechell Singleton 01/31/2021,7:37 AM

## 2021-01-31 NOTE — Progress Notes (Signed)
PROGRESS NOTE  ARILLA HICE ZOX:[id number] DOB: 22-Dec-1990 DOA: 12/16/2020 PCP: Hildred Laser, MD  HPI/Recap of past 24 hours: ALEYA DURNELL is a 30 y.o. G2P0101 with a history of T1DM, chronic HFrEF who was admitted initially 6/30 to [facility] for nausea and vomiting subsequently transferred to [facility] for rhinovirus pneumonia, discharged 7/20 only to return 7/22 with starvation ketosis and brachial vein DVT. TPN thru PICC was started and ultimately the patient was transferred to [facility] for cortrack placement on 7/26. Further complications detailed below:  7/27 cardiology consulted for cardiomyopathy, PICC line inserted, tube feeding started. 7/29 having recurrent retention, Foley catheter inserted 7/30 PRBC transfused 8/5 PRBC transfused 8/16 cardiology signed off, no follow-up recommended 8/19 nephrology consulted, no follow-up recommended 8/20 PRBC transfused 8/21 rescue course of betamethasone 8/22 Foley catheter reinserted after removal due to retention. 8/23 GI consulted. MFM consulted, nephrology signed off. 8/24 underwent EGD shows severe esophagitis with mild oozing.   Dobbhoff removed patient on oral diet. 8/25 PICC line removed. 8/26 started on steroids for adrenal insufficiency. 8/27 2 episodes of hematemesis.  Started on PPI drip.  GI reconsulted.  Lovenox held. 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 8/30 PICC line reinserted due to poor IV access, difficulty with blood draws and need for IV heparin. 9/9 IV Lasix stopped due to concern for dehydration.  Started on p.o. amoxicillin 500 mg twice daily x7-days by OB due to concern for UTI with hematuria.   01/30/2021: She noted hematuria overnight.  Discussed with Dr. Para March, this happened previously when there was concern for UTI.  Started on p.o. amoxicillin 500 mg twice daily x7 days by OB based upon urine culture results from 01/05/2021 (positive for greater than 100,000 colonies  of Enterococcus faecalis pansensitive.)  01/31/21: No shortness of breath. Tightness when she walks but no new complaints.  We will continue to closely monitor off diuretics.  Assessment/Plan: Active Problems:   Delivery with history of C-section   Acute kidney injury (HCC)   Hyperemesis   DKA, type 1, not at goal [facility])   Acute deep vein thrombosis (DVT) of brachial vein of right upper extremity (HCC)   Vitreous hemorrhage of right eye (HCC)   Gastroparesis due to DM (HCC)   Diabetic retinopathy (HCC)   Acute on chronic combined systolic and diastolic CHF (congestive heart failure) (HCC)   Type 1 diabetes mellitus affecting pregnancy in second trimester, antepartum   HFrEF (heart failure with reduced ejection fraction) (HCC)   Anemia during pregnancy   Pre-existing type 1 diabetes mellitus during pregnancy in third trimester   History of preterm delivery, currently pregnant   Leg edema   Acute esophagitis   Pregnancy with 31 completed weeks gestation  Resolved hematemesis due to grade C esophagitis: Noted on EGD 8/24 by Dr. Orvan Falconer.  IV Protonix switched to oral on 01/28/2021. She is on 20 mg p.o. Pepcid twice daily, p.o. Protonix 40 mg daily, and Carafate, continue. Symptoms have resolved. No plan to repeat endoscopic evaluation at this time.    Hematuria with concern for UTI in pregnancy Urine culture ordered by OB, started on amoxicillin 500 mg twice daily x7 days by OB based upon urine culture results from 01/05/2021 (positive for greater than 100,000 colonies of Enterococcus faecalis pansensitive.) DC IV Lasix for now (9/9).  Recurrent Hypoglycemia, due to poor oral intake Continue insulin drip and D10 W at 25 cc/h. Continue to encourage increase in oral intake. She denies  having any nausea at this morning.  Anasarca due to severe malnutrition related to hyperemesis as above as well as acute on chronic HFrEF, HTN:  Updated echo showed improved LVEF 45% now up to 50-55%. RV  and pulmonary artery pressures wnl. Small pericardial effusion noted without tamponade. Off Lasix since 01/30/21  Chronic insomnia She is currently on Unisom 12.5 mg twice daily States she did not get much sleep last night  Improving, elevated liver chemistries AST and ALT levels are normalizing. Continue to closely monitor.   Improved hyperemesis, intractable nausea and vomiting, gastroparesis:  -Currently on scheduled reglan and prn zofran -No nausea, no reported vomiting. -Continue to encourage increase in oral intake.   Acute blood loss anemia in the setting of hematuria from 01/30/2021 superimposed by anemia of pregnancy:  s/p 7u PRBCs this hospitalization (last 2u 9/5) Hemoglobin downtrending this morning 8.4 from 9.2. Continue to closely monitor hemoglobin and transfuse with hemoglobin less than 8. If hematuria persists, repeat CBC this afternoon.  Acute thrombocytopenia Platelet count downtrending 113 from 171 on 01/24/2021. Continue to closely monitor  Adrenal insufficiency: ACTH stim test diagnostic.  - Continue hydrocortisone taper as ordered. Plan eventual dosing 20mg  qAM, 10mg  qPM.  - Will need stress dosing at time of delivery per OB. Reasonable dosing initially could be hydrocortisone 50mg  q8h.    Right brachial vein DVT: Dx 12/07/20.  Seems to be recannulating based on repeat U/S.  -Continue therapeutic anticoagulation with heparin drip.   T1DM with gastroparesis and peripheral neuropathy: Brittle control due to sensitivity and inconsistent calorie inputs. HbA1c was 5.8% 11/21/2020 (impacted by mid-late pregnancy).  We will keep on insulin drip and D10 W as she is approaching her delivery date. Appreciate diabetes coordinator's assistance. Avoid hypoglycemia.  Resolved post repletion: Hypokalemia and hypomagnesemia Potassium 3.9, magnesium 1.8 Replete as indicated  HTN, stable: Blood pressure stable. Continue p.o. hydralazine 75mg  q8h.  Continue to monitor for  preeclampsia.  Continue to closely monitor vital signs.   Resolved acute urinary retention:  Right hydronephrosis is likely physiologic in this setting.  Voiding spontaneously without difficulty at this time.   Resolved nonoliguric AKI, prerenal in the setting of dehydration from poor oral intake, intake is now improved:  Creatinine is downtrending 1.14 from 1.17 from 1.20.  IUP:  - MFM recommends rLTCS at 34 weeks (9/20). Continue NSTs/BPP/serial growth scans. Leaving orders in this regard to MFM/OB.  NST on 9/8 was reactive, reassuring.  - Has received BMZ - Procardia given 9/5 and on 01/29/2021 for contractions.  - OB co-managing, appreciate OB/GYN's assistance.   Enterococcus faecalis UTI: Urine culture collected from 01/05/2021, pansensitive, treated with amoxicillin 8/16 - 8/22.   Limited venous access:  - PICC reinserted 8/30.   DVT prophylaxis: Heparin gtt Code Status: Full Family Communication: None at bedside Disposition Plan:  Status is: Inpatient   Remains inpatient appropriate because:IV treatments appropriate due to intensity of illness or inability to take PO   Dispo: The patient is from: Home              Anticipated d/c is to: Home with OB/GYN signs off.              Patient currently is not medically stable to d/c.   Consultants:  OB/GYN Nephrology Cardiology GI    Objective: Vitals:   01/30/21 1944 01/31/21 0543 01/31/21 0804 01/31/21 1121  BP: (!) 135/50 (!) 144/62 (!) 144/64 136/60  Pulse: (!) 115 94 91 91  Resp: 16 17 18  18  Temp: 97.6 F (36.4 C) 98 F (36.7 C) 98.6 F (37 C) 98.2 F (36.8 C)  TempSrc: Oral Oral Oral Oral  SpO2: 97% 97% 98% 98%  Weight:      Height:        Intake/Output Summary (Last 24 hours) at 01/31/2021 1207 Last data filed at 01/31/2021 1100 Gross per 24 hour  Intake 2033.8 ml  Output 1550 ml  Net 483.8 ml   Filed Weights   01/20/21 0605 01/24/21 0603 01/26/21 0530  Weight: 72 kg 81.7 kg 80.6 kg     Exam:  General: 30 y.o. year-old female Pleasant well developed well nourished in no acute distress.  Alert and oriented x 3 Cardiovascular: RRR no rubs or gallops.  Respiratory: CTA no wheezes or rales.  Good inspiratory efforts. Abdomen: Gravid abdomen with normal bowel sounds. Musculoskeletal: Bilateral lower extremity edema.  2 out of 4 pulses in all 4 extremities.   Skin: No ulcerative lesions Psychiatry: Mood is appropriate for condition and setting.   Data Reviewed: CBC: Recent Labs  Lab 01/28/21 0350 01/29/21 0810 01/30/21 0916 01/30/21 1707 01/31/21 0428  WBC 8.2 7.6 7.7 7.0 7.8  NEUTROABS 5.9  --   --   --   --   HGB 9.0* 9.2* 8.4* 10.8* 8.1*  HCT 27.3* 27.6* 25.7* 33.4* 25.1*  MCV 92.5 92.6 94.5 94.1 95.1  PLT 144* 141* 113* 113* 120*   Basic Metabolic Panel: Recent Labs  Lab 01/25/21 0630 01/26/21 0600 01/27/21 0417 01/28/21 0350 01/29/21 0810 01/30/21 0916 01/31/21 0612  NA 132* 134* 135 135 135 136  --   K 3.7 4.0 3.9 3.3* 3.4* 3.7 3.9  CL 103 98 104 101 102 103  --   CO2 24 24 24 26 27 27   --   GLUCOSE 357* 238* 91 92 79 152*  --   BUN 18 20 24* 23* 24* 25*  --   CREATININE 0.98 1.12* 1.14* 1.20* 1.17* 1.14*  --   CALCIUM 7.9* 8.8* 8.3* 8.6* 8.3* 8.0*  --   MG 1.7  --   --   --   --   --  1.8   GFR: Estimated Creatinine Clearance: 70 mL/min (A) (by C-G formula based on SCr of 1.14 mg/dL (H)). Liver Function Tests: Recent Labs  Lab 01/27/21 0417 01/28/21 0350 01/29/21 0810 01/30/21 0916  AST 59* 62* 44* 36  ALT 82* 92* 76* 62*  ALKPHOS 90 85 76 76  BILITOT 0.9 0.9 0.8 0.9  PROT 4.8* 4.6* 4.9* 4.6*  ALBUMIN 2.4* 2.3* 2.4* 2.3*   No results for input(s): LIPASE, AMYLASE in the last 168 hours. No results for input(s): AMMONIA in the last 168 hours. Coagulation Profile: No results for input(s): INR, PROTIME in the last 168 hours. Cardiac Enzymes: No results for input(s): CKTOTAL, CKMB, CKMBINDEX, TROPONINI in the last 168 hours. BNP  (last 3 results) No results for input(s): PROBNP in the last 8760 hours. HbA1C: No results for input(s): HGBA1C in the last 72 hours. CBG: Recent Labs  Lab 01/30/21 1644 01/31/21 0547 01/31/21 0635 01/31/21 0747 01/31/21 1152  GLUCAP 111* 70 77 87 129*   Lipid Profile: No results for input(s): CHOL, HDL, LDLCALC, TRIG, CHOLHDL, LDLDIRECT in the last 72 hours. Thyroid Function Tests: No results for input(s): TSH, T4TOTAL, FREET4, T3FREE, THYROIDAB in the last 72 hours. Anemia Panel: No results for input(s): VITAMINB12, FOLATE, FERRITIN, TIBC, IRON, RETICCTPCT in the last 72 hours. Urine analysis:    Component  Value Date/Time   COLORURINE AMBER (A) 01/10/2021 0131   APPEARANCEUR CLOUDY (A) 01/10/2021 0131   APPEARANCEUR Clear 09/18/2020 1153   LABSPEC 1.017 01/10/2021 0131   LABSPEC 1.026 03/01/2013 1844   PHURINE 5.0 01/10/2021 0131   GLUCOSEU NEGATIVE 01/10/2021 0131   GLUCOSEU >=500 03/01/2013 1844   HGBUR NEGATIVE 01/10/2021 0131   BILIRUBINUR NEGATIVE 01/10/2021 0131   BILIRUBINUR neg 10/23/2020 0953   BILIRUBINUR Negative 09/18/2020 1153   BILIRUBINUR Negative 03/01/2013 1844   KETONESUR NEGATIVE 01/10/2021 0131   PROTEINUR NEGATIVE 01/10/2021 0131   UROBILINOGEN 0.2 10/23/2020 0953   NITRITE NEGATIVE 01/10/2021 0131   LEUKOCYTESUR SMALL (A) 01/10/2021 0131   LEUKOCYTESUR Negative 03/01/2013 1844   Sepsis Labs: @LABRCNTIP (procalcitonin:4,lacticidven:4)  ) Recent Results (from the past 240 hour(s))  OB Urine Culture     Status: Abnormal   Collection Time: 01/27/21 11:30 AM   Specimen: OB Clean Catch; Urine  Result Value Ref Range Status   Specimen Description OB CLEAN CATCH  Final   Special Requests NONE  Final   Culture (A)  Final    MULTIPLE SPECIES PRESENT, SUGGEST RECOLLECTION NO GROUP B STREP (S.AGALACTIAE) ISOLATED Performed at [facility], [address], [place], [place]    Report Status 01/28/2021 FINAL  Final  OB Urine Culture      Status: Abnormal   Collection Time: 01/28/21  2:37 PM   Specimen: OB Clean Catch; Urine  Result Value Ref Range Status   Specimen Description OB CLEAN CATCH  Final   Special Requests NONE  Final   Culture (A)  Final    MULTIPLE SPECIES PRESENT, SUGGEST RECOLLECTION NO GROUP B STREP (S.AGALACTIAE) ISOLATED Performed at [facility], [address] [place], [place]    Report Status 01/29/2021 FINAL  Final      Studies: No results found.  Scheduled Meds:  sodium chloride   Intravenous Once   amoxicillin  500 mg Oral BID   Chlorhexidine Gluconate Cloth  6 each Topical Daily   cholecalciferol  1,000 Units Oral Daily   vitamin B-6  12.5 mg Oral BID   And   doxylamine (Sleep)  12.5 mg Oral BID   DULoxetine  20 mg Oral Daily   famotidine  20 mg Oral BID   feeding supplement (GLUCERNA SHAKE)  237 mL Oral BID BM   hydrALAZINE  75 mg Oral Q8H   hydrocortisone sod succinate (SOLU-CORTEF) inj  40 mg Intravenous Daily   mouth rinse  15 mL Mouth Rinse q12n4p   metoCLOPramide (REGLAN) injection  5 mg Intravenous Q6H   pantoprazole  40 mg Oral Daily   prenatal vitamin w/FE, FA  1 tablet Oral Q1200   sodium chloride  1 spray Each Nare Q2H   sodium chloride flush  10-40 mL Intracatheter Q12H   sucralfate  1 g Oral TID WC & HS   terbutaline  0.25 mg Subcutaneous Once   vitamin B-12  1,000 mcg Oral Daily    Continuous Infusions:  dextrose 25 mL/hr (01/30/21 2216)   heparin 1,500 Units/hr (01/30/21 2216)   insulin 4 Units/hr (01/31/21 1153)     LOS: 46 days     04/02/21, MD [facility] Pager [phone]  If 7PM-7AM, please contact night-coverage [email or web address] Password [facility] 01/31/2021, 12:07 PM

## 2021-02-01 LAB — CBC
HCT: 24.9 % — ABNORMAL LOW (ref 36.0–46.0)
Hemoglobin: 7.9 g/dL — ABNORMAL LOW (ref 12.0–15.0)
MCH: 30.7 pg (ref 26.0–34.0)
MCHC: 31.7 g/dL (ref 30.0–36.0)
MCV: 96.9 fL (ref 80.0–100.0)
Platelets: 107 10*3/uL — ABNORMAL LOW (ref 150–400)
RBC: 2.57 MIL/uL — ABNORMAL LOW (ref 3.87–5.11)
RDW: 16.4 % — ABNORMAL HIGH (ref 11.5–15.5)
WBC: 7.8 10*3/uL (ref 4.0–10.5)
nRBC: 0 % (ref 0.0–0.2)

## 2021-02-01 LAB — COMPREHENSIVE METABOLIC PANEL
ALT: 72 U/L — ABNORMAL HIGH (ref 0–44)
AST: 51 U/L — ABNORMAL HIGH (ref 15–41)
Albumin: 2.4 g/dL — ABNORMAL LOW (ref 3.5–5.0)
Alkaline Phosphatase: 81 U/L (ref 38–126)
Anion gap: 9 (ref 5–15)
BUN: 24 mg/dL — ABNORMAL HIGH (ref 6–20)
CO2: 26 mmol/L (ref 22–32)
Calcium: 8.2 mg/dL — ABNORMAL LOW (ref 8.9–10.3)
Chloride: 101 mmol/L (ref 98–111)
Creatinine, Ser: 1.16 mg/dL — ABNORMAL HIGH (ref 0.44–1.00)
GFR, Estimated: >60 mL/min (ref >60)
Glucose, Bld: 108 mg/dL — ABNORMAL HIGH (ref 70–99)
Potassium: 3.8 mmol/L (ref 3.5–5.1)
Sodium: 136 mmol/L (ref 135–145)
Total Bilirubin: 0.9 mg/dL (ref 0.3–1.2)
Total Protein: 4.7 g/dL — ABNORMAL LOW (ref 6.5–8.1)

## 2021-02-01 LAB — GLUCOSE, CAPILLARY
Glucose-Capillary: 99 mg/dL (ref 70–99)
Glucose-Capillary: 149 mg/dL — ABNORMAL HIGH (ref 70–99)

## 2021-02-01 LAB — HEPARIN LEVEL (UNFRACTIONATED): Heparin Unfractionated: 0.7 IU/mL (ref 0.30–0.70)

## 2021-02-01 MED ORDER — FENTANYL CITRATE (PF) 100 MCG/2ML IJ SOLN
50.0000 ug | Freq: Once | INTRAMUSCULAR | Status: AC
  Administered 2021-02-02: 50 ug via INTRAVENOUS
  Filled 2021-02-01 (×2): qty 2

## 2021-02-01 MED ORDER — FENTANYL CITRATE (PF) 100 MCG/2ML IJ SOLN
50.0000 ug | Freq: Once | INTRAMUSCULAR | Status: AC
  Administered 2021-02-01: 50 ug via INTRAVENOUS
  Filled 2021-02-01: qty 2

## 2021-02-01 NOTE — Progress Notes (Signed)
ANTICOAGULATION CONSULT NOTE - Follow Up Consult  Pharmacy Consult for heparin Indication:  right brachial VTE in pregnancy  No Known Allergies  Patient Measurements: Height: 5\' 1"  (154.9 cm) Weight: 80.6 kg (177 lb 9.6 oz) IBW/kg (Calculated) : 47.8 Heparin Dosing Weight: 63.4 kg  Vital Signs: Temp: 97.5 F (36.4 C) (09/11 0754) Temp Source: Oral (09/11 0754) BP: 143/63 (09/11 0754) Pulse Rate: 92 (09/11 0754)  Labs: Recent Labs    01/30/21 0306 01/30/21 0916 01/30/21 0916 01/30/21 1707 01/31/21 0428 02/01/21 0513  HGB  --  8.4*   < > 10.8* 8.1* 7.9*  HCT  --  25.7*   < > 33.4* 25.1* 24.9*  PLT  --  113*   < > 113* 120* 107*  HEPARINUNFRC 0.43  --   --   --  0.62 0.70  CREATININE  --  1.14*  --   --   --  1.16*   < > = values in this interval not displayed.    Estimated Creatinine Clearance: 68.8 mL/min (A) (by C-G formula based on SCr of 1.16 mg/dL (H)).   Medications:  Scheduled:   sodium chloride   Intravenous Once   amoxicillin  500 mg Oral BID   Chlorhexidine Gluconate Cloth  6 each Topical Daily   cholecalciferol  1,000 Units Oral Daily   vitamin B-6  12.5 mg Oral BID   And   doxylamine (Sleep)  12.5 mg Oral BID   DULoxetine  20 mg Oral Daily   famotidine  20 mg Oral BID   feeding supplement (GLUCERNA SHAKE)  237 mL Oral BID BM   hydrALAZINE  75 mg Oral Q8H   hydrocortisone sod succinate (SOLU-CORTEF) inj  40 mg Intravenous Daily   mouth rinse  15 mL Mouth Rinse q12n4p   metoCLOPramide (REGLAN) injection  5 mg Intravenous Q6H   pantoprazole  40 mg Oral Daily   prenatal vitamin w/FE, FA  1 tablet Oral Q1200   sodium chloride  1 spray Each Nare Q2H   sodium chloride flush  10-40 mL Intracatheter Q12H   sucralfate  1 g Oral TID WC & HS   terbutaline  0.25 mg Subcutaneous Once   vitamin B-12  1,000 mcg Oral Daily   Infusions:   dextrose 30 mL/hr at 02/01/21 0100   heparin 1,500 Units/hr (02/01/21 0200)   insulin Stopped (02/01/21 0734)     Assessment: 30 yo F at [redacted]w[redacted]d gestation on heparin drip for right brachial VTE. Platelets today dropped to 107. Level this AM at 04:28 is therapeutic at 0.62 units/mL. 30 yo F at [redacted]w[redacted]d gestation on heparin drip for right brachial VTE. Platelets today are 120, low but stable. Level this AM at 05:13 is therapeutic at 0.7 units/mL.   Goal of Therapy:  Heparin level 0.3-0.7 units/ml Monitor platelets by anticoagulation protocol: Yes   Plan:  Continue heparin at 1500 units/hr. Monitor for delivery and when to stop heparin. Monitor heparin levels daily. Continue to monitor H&H and platelets.  [redacted]w[redacted]d Debbora Ang 02/01/2021,8:56 AM

## 2021-02-01 NOTE — Progress Notes (Signed)
Patient ID: Ashley Camacho, female   DOB: 10-11-1990, 30 y.o.   MRN: [id number] FACULTY PRACTICE ANTEPARTUM(COMPREHENSIVE) NOTE  Ashley Camacho is a 30 y.o. G2P0101 with Estimated Date of Delivery: 03/30/21   By   [redacted]w[redacted]d  who is admitted for management of medical issues related to T! DM, historically poorly controlled.    Fetal presentation is cephalic. Length of Stay:  47  Days  Date of admission:12/16/2020  Subjective: Pelvic pressure cramping radiating to her back Patient reports the fetal movement as active. Patient reports uterine contraction  activity as irregular Patient reports  vaginal bleeding as none. Patient describes fluid per vagina as None.  Vitals:  Blood pressure (!) 143/63, pulse 92, temperature (!) 97.5 F (36.4 C), temperature source Oral, resp. rate 17, height 5\' 1"  (1.549 m), weight 80.6 kg, SpO2 99 %. Vitals:   01/31/21 1538 01/31/21 2303 02/01/21 0531 02/01/21 0754  BP: 137/61 140/60 (!) 147/67 (!) 143/63  Pulse: 94 87 90 92  Resp: 18 16 16 17   Temp: 98 F (36.7 C) 98 F (36.7 C) 98.1 F (36.7 C) (!) 97.5 F (36.4 C)  TempSrc: Oral Oral Oral Oral  SpO2: 98% 95% 98% 99%  Weight:      Height:       Physical Examination:  General appearance - more uncomfortable today Abdomen - soft, nontender, nondistended, no masses or organomegaly Fundal Height:  size equals dates Pelvic Exam:  examination not indicated Cervical Exam: Not evaluated. Extremities: extremities normal, atraumatic, no cyanosis or edema with DTRs 2+ bilaterally Membranes:intact  Fetal Monitoring:  Baseline: 140s bpm, Variability: Good {> 6 bpm), Accelerations: Reactive, and Decelerations: Absent   reactive There is uterine activity on the EFM today  Labs:  Results for orders placed or performed during the hospital encounter of 12/16/20 (from the past 24 hour(s))  Glucose, capillary   Collection Time: 01/31/21 11:52 AM  Result Value Ref Range   Glucose-Capillary 129 (H) 70 - 99 mg/dL   Heparin level (unfractionated)   Collection Time: 02/01/21  5:13 AM  Result Value Ref Range   Heparin Unfractionated 0.70 0.30 - 0.70 IU/mL  Comprehensive metabolic panel   Collection Time: 02/01/21  5:13 AM  Result Value Ref Range   Sodium 136 135 - 145 mmol/L   Potassium 3.8 3.5 - 5.1 mmol/L   Chloride 101 98 - 111 mmol/L   CO2 26 22 - 32 mmol/L   Glucose, Bld 108 (H) 70 - 99 mg/dL   BUN 24 (H) 6 - 20 mg/dL   Creatinine, Ser 04/03/21 (H) 0.44 - 1.00 mg/dL   Calcium 8.2 (L) 8.9 - 10.3 mg/dL   Total Protein 4.7 (L) 6.5 - 8.1 g/dL   Albumin 2.4 (L) 3.5 - 5.0 g/dL   AST 51 (H) 15 - 41 U/L   ALT 72 (H) 0 - 44 U/L   Alkaline Phosphatase 81 38 - 126 U/L   Total Bilirubin 0.9 0.3 - 1.2 mg/dL   GFR, Estimated 04/03/21 2.87 mL/min   Anion gap 9 5 - 15  CBC   Collection Time: 02/01/21  5:13 AM  Result Value Ref Range   WBC 7.8 4.0 - 10.5 K/uL   RBC 2.57 (L) 3.87 - 5.11 MIL/uL   Hemoglobin 7.9 (L) 12.0 - 15.0 g/dL   HCT >11 (L) 04/03/21 - 57.2 %   MCV 96.9 80.0 - 100.0 fL   MCH 30.7 26.0 - 34.0 pg   MCHC 31.7 30.0 - 36.0 g/dL  RDW 16.4 (H) 11.5 - 15.5 %   Platelets 107 (L) 150 - 400 K/uL   nRBC 0.0 0.0 - 0.2 %  Type and screen [facility] Draw Type and Screen with other 5:00am labs   Collection Time: 02/01/21  5:13 AM  Result Value Ref Range   ABO/RH(D) A POS    Antibody Screen NEG    Sample Expiration      02/04/2021,2359 Performed at [facility], [address], [place], [place] [address]   Glucose, capillary   Collection Time: 02/01/21  5:31 AM  Result Value Ref Range   Glucose-Capillary 99 70 - 99 mg/dL    Imaging Studies:    No results found.   Medications:  Scheduled  sodium chloride   Intravenous Once   amoxicillin  500 mg Oral BID   Chlorhexidine Gluconate Cloth  6 each Topical Daily   cholecalciferol  1,000 Units Oral Daily   vitamin B-6  12.5 mg Oral BID   And   doxylamine (Sleep)  12.5 mg Oral BID   DULoxetine  20 mg Oral Daily    famotidine  20 mg Oral BID   feeding supplement (GLUCERNA SHAKE)  237 mL Oral BID BM   hydrALAZINE  75 mg Oral Q8H   hydrocortisone sod succinate (SOLU-CORTEF) inj  40 mg Intravenous Daily   mouth rinse  15 mL Mouth Rinse q12n4p   metoCLOPramide (REGLAN) injection  5 mg Intravenous Q6H   pantoprazole  40 mg Oral Daily   prenatal vitamin w/FE, FA  1 tablet Oral Q1200   sodium chloride  1 spray Each Nare Q2H   sodium chloride flush  10-40 mL Intracatheter Q12H   sucralfate  1 g Oral TID WC & HS   terbutaline  0.25 mg Subcutaneous Once   vitamin B-12  1,000 mcg Oral Daily   I have reviewed the patient's current medications.  ASSESSMENT: G2P0101 [redacted]w[redacted]d Estimated Date of Delivery: 03/30/21  Patient Active Problem List   Diagnosis Date Noted   Pregnancy with 31 completed weeks gestation    Acute esophagitis    Leg edema    History of preterm delivery, currently pregnant    Pre-existing type 1 diabetes mellitus during pregnancy in third trimester    Anemia during pregnancy    Type 1 diabetes mellitus affecting pregnancy in second trimester, antepartum    HFrEF (heart failure with reduced ejection fraction) (HCC)    Acute deep vein thrombosis (DVT) of brachial vein of right upper extremity (HCC) 12/17/2020   Anxiety 12/17/2020   Diabetic retinopathy (HCC) 12/17/2020   Acute on chronic combined systolic and diastolic CHF (congestive heart failure) (HCC)    Hyperemesis 12/13/2020   DKA, type 1, not at goal [facility]) 12/13/2020   Sunburn 12/13/2020   Hypothyroidism 12/10/2020   Malnutrition (HCC) 12/10/2020   Vitreous hemorrhage of right eye (HCC) 12/10/2020   Pleural effusion associated with pulmonary infection 11/25/2020   Pneumonia affecting pregnancy 11/24/2020   Diabetes mellitus affecting pregnancy, second trimester 11/24/2020   Edema 11/24/2020   Decreased urine output 11/24/2020   Shortness of breath    Zinc deficiency    SOB (shortness of breath)    Social problem 11/21/2020    Nausea and vomiting during pregnancy prior to [redacted] weeks gestation 11/21/2020   Low serum vitamin B12    Delivery with history of C-section 11/20/2020   Absolute anemia    Acute kidney injury (HCC)    Nausea and vomiting during pregnancy  10/23/2020   History of premature delivery, currently pregnant, second trimester 10/13/2020   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   Depressive disorder 09/28/2018   Neuropathy 09/28/2018   Gastroparesis due to DM (HCC) 04/22/2016   Diabetic sensorimotor neuropathy (HCC) 04/19/2016   DKA (diabetic ketoacidoses) 04/03/2016   Microalbuminuria 09/26/2005    PLAN: No substanitive change in care plan OB Care             - BPP weekly - Last on 9/6 - 8/8, cephalic, nl afi, NST reactive - Growth q 4 wks, last completed 8/22 - S/p BMZ 8/23-24 - Had Mag 1g (around 28-29w) x6h but stopped due to elevated Mag level - MFM following- see their prior note for details - Has intermittent Braxton Hicks ctxns - no indication to recheck cervix at this time   GI N/V/Esophagitis - Tolerating diet, Glucerna  - Continue pepcid, protonix, carafate. Scheduled anti-emetic is Reglan and zofran - S/p EGD with GI on 8/24 Malnutrition - Dietician on board - B12 replacement per IM - On daily vitamin   Endo T1DM - on Endotool. 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 Facaelis in the past that was pan sensitive. Will presume this is the cause for the hematuria again given her hisotry. Amox 500 bid started for 7d             - Lasix on hold per primary team   Heme Anemia - Work up per IM, was stable until hematuria. Recheck at 1700 planned - Fecal occult positive- GI aware, in s/o pregnancy. No colonoscopy at this time - If Hgb  worsens and assessment/intervention is warranted may reconsider  - HgB appropriate after 2u PRBC over the weekend. 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 We will continue to watch her closely for this. LFTs continued to normalize - If concern for delivery, anesthesia team recommends holding heparin drip 4-6 hours and check a PTT. If unable to do so due to emergency she would have general anesthesia.    - s/p cardiology consult: CHF stable   Maternal Care - tylenol prn - encourage ambulation, PT/OT on board   Plan as outlined above, continue with supportive care with co-management per IM.  Goal for delivery @ 34wk via Repeat C-section and bilateral tubal sterilization if maternal/fetal well being remains stable. Medicaid tubal papers signed and on her paper chart. She and I discussed BTL again on Wednesday and she reiterated her desire for BTL. Will address again at the time of delivery.    Continue routine antenatal care.  Thursday Lunah Losasso 02/01/2021,9:06 AM

## 2021-02-01 NOTE — Progress Notes (Signed)
PROGRESS NOTE  Ashley Camacho DGL:[id number] DOB: 03/02/1991 DOA: 12/16/2020 PCP: Hildred Laser, MD  HPI/Recap of past 24 hours: Ashley Camacho is a 30 y.o. G2P0101 with a history of T1DM, chronic HFrEF who was admitted initially 6/30 to [facility] for nausea and vomiting subsequently transferred to [facility] for rhinovirus pneumonia, discharged 7/20 only to return 7/22 with starvation ketosis and brachial vein DVT. TPN thru PICC was started and ultimately the patient was transferred to [facility] for cortrack placement on 7/26. Further complications detailed below:  7/27 cardiology consulted for cardiomyopathy, PICC line inserted, tube feeding started. 7/29 having recurrent retention, Foley catheter inserted 7/30 PRBC transfused 8/5 PRBC transfused 8/15 UCX pansensitive Enterococcus faecalis. 8/16 cardiology signed off, no follow-up recommended 8/19 nephrology consulted, no follow-up recommended 8/20 PRBC transfused 8/21 rescue course of betamethasone 8/22 Foley catheter reinserted after removal due to retention. 8/23 GI consulted. MFM consulted, nephrology signed off. 8/24 underwent EGD shows severe esophagitis with mild oozing.   Dobbhoff removed patient on oral diet. 8/25 PICC line removed. 8/26 started on steroids for adrenal insufficiency. 8/27 2 episodes of hematemesis.  Started on PPI drip.  GI reconsulted.  Lovenox held. 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 8/30 PICC line reinserted due to poor IV access, difficulty with blood draws and need for IV heparin. 9/9 IV Lasix stopped due to concern for dehydration.  Started on p.o. amoxicillin 500 mg twice daily x7-days by OB due to concern for hemorrhagic cystitis.    02/01/21: Seen and examined at her bedside.  She reports nausea and vomiting this morning x2.  Reports abdominal cramping.  Antiemetics in place.  Denies any shortness of breath or chest pain.  Assessment/Plan: Active Problems:    Delivery with history of C-section   Acute kidney injury (HCC)   Hyperemesis   DKA, type 1, not at goal [facility])   Acute deep vein thrombosis (DVT) of brachial vein of right upper extremity (HCC)   Vitreous hemorrhage of right eye (HCC)   Gastroparesis due to DM (HCC)   Diabetic retinopathy (HCC)   Acute on chronic combined systolic and diastolic CHF (congestive heart failure) (HCC)   Type 1 diabetes mellitus affecting pregnancy in second trimester, antepartum   HFrEF (heart failure with reduced ejection fraction) (HCC)   Anemia during pregnancy   Pre-existing type 1 diabetes mellitus during pregnancy in third trimester   History of preterm delivery, currently pregnant   Leg edema   Acute esophagitis   Pregnancy with 31 completed weeks gestation  Resolved hematemesis due to grade C esophagitis: Noted on EGD 8/24 by Dr. Orvan Falconer.  IV Protonix switched to oral on 01/28/2021. She is on 20 mg p.o. Pepcid twice daily, p.o. Protonix 40 mg daily, and Carafate, continue. No plan to repeat endoscopic evaluation at this time.  Nausea and vomiting/hyperemesis/gastroparesis in the setting of pregnancy She is on schedule IV Reglan 5 mg every 6 hours and as needed IV Zofran and IV Compazine as needed Oral intake as tolerated. Decrease insulin rate 9/11 Closely monitor for hypoglycemia  Elevated liver chemistries, uptrending Normal T bilirubin.  Does not appear to be hemolyzing. Continue to closely monitor and repeat CMP in the morning  Acute blood loss anemia in the setting of hemorrhagic cystitis in pregnancy Hemoglobin dropped 7.9 from 8.1 Continue to closely monitor Transfuse hemoglobin less than 7  Hematuria with concern for hemorrhagic cystitis in pregnancy Repeat urine culture ordered on 01/30/2021 negative, started on  amoxicillin 500 mg twice daily x7 days by OB based upon urine culture results from 01/05/2021 (positive for greater than 100,000 colonies of Enterococcus faecalis  pansensitive.) DC IV Lasix for now (9/9). Continue to closely monitor Drop in hemoglobin this morning  Recurrent Hypoglycemia, due to poor oral intake, nausea with vomiting. Continue insulin drip and D10 W at 25 cc/h. Continue to encourage increase in oral intake. Antiemetics as needed  Anasarca due to severe malnutrition related to hyperemesis as above as well as acute on chronic HFrEF, HTN:  Updated echo showed improved LVEF 45% now up to 50-55%. RV and pulmonary artery pressures wnl. Small pericardial effusion noted without tamponade. Off Lasix 01/30/21  Chronic insomnia She is currently on Unisom 12.5 mg twice daily States she did not get much sleep last night   Acute blood loss anemia in the setting of hematuria from 01/30/2021 superimposed by anemia of pregnancy:  s/p 7u PRBCs this hospitalization (last 2u 9/5) Hemoglobin downtrending this morning 7.9 from 8.4 from 9.2. Continue to closely monitor hemoglobin and transfuse as indicated If hematuria persists, repeat CBC this afternoon. Obtain iron studies in the morning.  Acute thrombocytopenia, downtrending Platelet count downtrending 107 from 113 from 171 on 01/24/2021. Continue to closely monitor and repeat a CBC in the morning.  Adrenal insufficiency: ACTH stim test diagnostic.  - Continue hydrocortisone taper as ordered. Plan eventual dosing 20mg  qAM, 10mg  qPM.  - Will need stress dosing at time of delivery per OB. Reasonable dosing initially could be hydrocortisone 50mg  q8h.    Right brachial vein DVT: Dx 12/07/20.  Seems to be recannulating based on repeat U/S.  -Continue therapeutic anticoagulation with heparin drip.   T1DM with gastroparesis and peripheral neuropathy: Brittle control due to sensitivity and inconsistent calorie inputs. HbA1c was 5.8% 11/21/2020 (impacted by mid-late pregnancy).  We will keep on insulin drip and D10 W as she is approaching her delivery date. Appreciate diabetes coordinator's  assistance. Avoid hypoglycemia.  Resolved post repletion: Hypokalemia and hypomagnesemia Potassium 3.9, magnesium 1.8 Replete as indicated  HTN, stable: Mild increase in her BP, continue to closely monitor. Continue p.o. hydralazine 75mg  q8h.  Continue to monitor for preeclampsia.    Resolved acute urinary retention:  Right hydronephrosis is likely physiologic in this setting.  Voiding spontaneously without difficulty at this time.   Resolved nonoliguric AKI, prerenal in the setting of dehydration from poor oral intake, intake is now improved:  Creatinine 1.16 from 1.14 from 1.17 from 1.20.  IUP:  - MFM recommends rLTCS at 34 weeks (9/20). Continue NSTs/BPP/serial growth scans. Leaving orders in this regard to MFM/OB.  NST on 9/8 was reactive, reassuring.  - Has received BMZ - Procardia given 9/5 and on 01/29/2021 for contractions.  - OB co-managing, appreciate OB/GYN's assistance.   Enterococcus faecalis UTI: Urine culture collected from 01/05/2021, pansensitive, treated with amoxicillin 8/16 - 8/22.   Limited venous access:  - PICC reinserted 8/30.   DVT prophylaxis: Heparin gtt Code Status: Full Family Communication: None at bedside Disposition Plan:  Status is: Inpatient   Remains inpatient appropriate because:IV treatments appropriate due to intensity of illness or inability to take PO   Dispo: The patient is from: Home              Anticipated d/c is to: Home with OB/GYN signs off.              Patient currently is not medically stable to d/c.   Consultants:  OB/GYN Nephrology Cardiology GI  Objective: Vitals:   01/31/21 1538 01/31/21 2303 02/01/21 0531 02/01/21 0754  BP: 137/61 140/60 (!) 147/67 (!) 143/63  Pulse: 94 87 90 92  Resp: 18 16 16 17   Temp: 98 F (36.7 C) 98 F (36.7 C) 98.1 F (36.7 C) (!) 97.5 F (36.4 C)  TempSrc: Oral Oral Oral Oral  SpO2: 98% 95% 98% 99%  Weight:      Height:        Intake/Output Summary (Last 24 hours) at  02/01/2021 1144 Last data filed at 02/01/2021 1000 Gross per 24 hour  Intake 1669.91 ml  Output 950 ml  Net 719.91 ml   Filed Weights   01/20/21 0605 01/24/21 0603 01/26/21 0530  Weight: 72 kg 81.7 kg 80.6 kg    Exam:  General: 30 y.o. year-old female well-developed well-nourished in no acute distress.  She is alert and oriented x3.  Appears uncomfortable due to abdominal cramping. Cardiovascular: Regular rate and rhythm with no rubs or gallops.   Respiratory: Clear to auscultation with no wheezes or rales. Abdomen: Gravid abdomen with normal bowel sounds present. Musculoskeletal: Bilateral lower extremity edema, 2+ pitting edema.  2 out of 4 pulses in all 4 extremities  Skin: No ulcerative lesions noted. Psychiatry: Mood is appropriate for condition and setting.   Data Reviewed: CBC: Recent Labs  Lab 01/28/21 0350 01/29/21 0810 01/30/21 0916 01/30/21 1707 01/31/21 0428 02/01/21 0513  WBC 8.2 7.6 7.7 7.0 7.8 7.8  NEUTROABS 5.9  --   --   --   --   --   HGB 9.0* 9.2* 8.4* 10.8* 8.1* 7.9*  HCT 27.3* 27.6* 25.7* 33.4* 25.1* 24.9*  MCV 92.5 92.6 94.5 94.1 95.1 96.9  PLT 144* 141* 113* 113* 120* 107*   Basic Metabolic Panel: Recent Labs  Lab 01/27/21 0417 01/28/21 0350 01/29/21 0810 01/30/21 0916 01/31/21 0612 02/01/21 0513  NA 135 135 135 136  --  136  K 3.9 3.3* 3.4* 3.7 3.9 3.8  CL 104 101 102 103  --  101  CO2 24 26 27 27   --  26  GLUCOSE 91 92 79 152*  --  108*  BUN 24* 23* 24* 25*  --  24*  CREATININE 1.14* 1.20* 1.17* 1.14*  --  1.16*  CALCIUM 8.3* 8.6* 8.3* 8.0*  --  8.2*  MG  --   --   --   --  1.8  --    GFR: Estimated Creatinine Clearance: 68.8 mL/min (A) (by C-G formula based on SCr of 1.16 mg/dL (H)). Liver Function Tests: Recent Labs  Lab 01/27/21 0417 01/28/21 0350 01/29/21 0810 01/30/21 0916 02/01/21 0513  AST 59* 62* 44* 36 51*  ALT 82* 92* 76* 62* 72*  ALKPHOS 90 85 76 76 81  BILITOT 0.9 0.9 0.8 0.9 0.9  PROT 4.8* 4.6* 4.9* 4.6* 4.7*   ALBUMIN 2.4* 2.3* 2.4* 2.3* 2.4*   No results for input(s): LIPASE, AMYLASE in the last 168 hours. No results for input(s): AMMONIA in the last 168 hours. Coagulation Profile: No results for input(s): INR, PROTIME in the last 168 hours. Cardiac Enzymes: No results for input(s): CKTOTAL, CKMB, CKMBINDEX, TROPONINI in the last 168 hours. BNP (last 3 results) No results for input(s): PROBNP in the last 8760 hours. HbA1C: No results for input(s): HGBA1C in the last 72 hours. CBG: Recent Labs  Lab 01/31/21 0547 01/31/21 0635 01/31/21 0747 01/31/21 1152 02/01/21 0531  GLUCAP 70 77 87 129* 99   Lipid Profile: No results for  input(s): CHOL, HDL, LDLCALC, TRIG, CHOLHDL, LDLDIRECT in the last 72 hours. Thyroid Function Tests: No results for input(s): TSH, T4TOTAL, FREET4, T3FREE, THYROIDAB in the last 72 hours. Anemia Panel: No results for input(s): VITAMINB12, FOLATE, FERRITIN, TIBC, IRON, RETICCTPCT in the last 72 hours. Urine analysis:    Component Value Date/Time   COLORURINE AMBER (A) 01/10/2021 0131   APPEARANCEUR CLOUDY (A) 01/10/2021 0131   APPEARANCEUR Clear 09/18/2020 1153   LABSPEC 1.017 01/10/2021 0131   LABSPEC 1.026 03/01/2013 1844   PHURINE 5.0 01/10/2021 0131   GLUCOSEU NEGATIVE 01/10/2021 0131   GLUCOSEU >=500 03/01/2013 1844   HGBUR NEGATIVE 01/10/2021 0131   BILIRUBINUR NEGATIVE 01/10/2021 0131   BILIRUBINUR neg 10/23/2020 0953   BILIRUBINUR Negative 09/18/2020 1153   BILIRUBINUR Negative 03/01/2013 1844   KETONESUR NEGATIVE 01/10/2021 0131   PROTEINUR NEGATIVE 01/10/2021 0131   UROBILINOGEN 0.2 10/23/2020 0953   NITRITE NEGATIVE 01/10/2021 0131   LEUKOCYTESUR SMALL (A) 01/10/2021 0131   LEUKOCYTESUR Negative 03/01/2013 1844   Sepsis Labs: @LABRCNTIP (procalcitonin:4,lacticidven:4)  ) Recent Results (from the past 240 hour(s))  OB Urine Culture     Status: Abnormal   Collection Time: 01/27/21 11:30 AM   Specimen: OB Clean Catch; Urine  Result Value  Ref Range Status   Specimen Description OB CLEAN CATCH  Final   Special Requests NONE  Final   Culture (A)  Final    MULTIPLE SPECIES PRESENT, SUGGEST RECOLLECTION NO GROUP B STREP (S.AGALACTIAE) ISOLATED Performed at [facility], [address], [place], [place] [address]    Report Status 01/28/2021 FINAL  Final  OB Urine Culture     Status: Abnormal   Collection Time: 01/28/21  2:37 PM   Specimen: OB Clean Catch; Urine  Result Value Ref Range Status   Specimen Description OB CLEAN CATCH  Final   Special Requests NONE  Final   Culture (A)  Final    MULTIPLE SPECIES PRESENT, SUGGEST RECOLLECTION NO GROUP B STREP (S.AGALACTIAE) ISOLATED Performed at [facility], [address] [place], [place] [address]    Report Status 01/29/2021 FINAL  Final  Urine Culture     Status: None   Collection Time: 01/30/21  9:21 AM   Specimen: Urine, Catheterized  Result Value Ref Range Status   Specimen Description URINE, CATHETERIZED  Final   Special Requests NONE  Final   Culture   Final    NO GROWTH Performed at [facility], [address] [place], [place] [address]    Report Status 01/31/2021 FINAL  Final      Studies: No results found.  Scheduled Meds:  sodium chloride   Intravenous Once   amoxicillin  500 mg Oral BID   Chlorhexidine Gluconate Cloth  6 each Topical Daily   cholecalciferol  1,000 Units Oral Daily   vitamin B-6  12.5 mg Oral BID   And   doxylamine (Sleep)  12.5 mg Oral BID   DULoxetine  20 mg Oral Daily   famotidine  20 mg Oral BID   feeding supplement (GLUCERNA SHAKE)  237 mL Oral BID BM   fentaNYL (SUBLIMAZE) injection  50 mcg Intravenous Once   hydrALAZINE  75 mg Oral Q8H   hydrocortisone sod succinate (SOLU-CORTEF) inj  40 mg Intravenous Daily   mouth rinse  15 mL Mouth Rinse q12n4p   metoCLOPramide (REGLAN) injection  5 mg Intravenous Q6H   pantoprazole  40 mg Oral Daily   prenatal vitamin w/FE, FA  1 tablet  Oral Q1200   sodium  chloride  1 spray Each Nare Q2H   sodium chloride flush  10-40 mL Intracatheter Q12H   sucralfate  1 g Oral TID WC & HS   terbutaline  0.25 mg Subcutaneous Once   vitamin B-12  1,000 mcg Oral Daily    Continuous Infusions:  dextrose 30 mL/hr at 02/01/21 0100   heparin 1,500 Units/hr (02/01/21 0200)   insulin 0.4 Units/hr (02/01/21 1117)     LOS: 47 days     Darlin Drop, MD [facility] Pager [phone]  If 7PM-7AM, please contact night-coverage [email or web address] Password [facility] 02/01/2021, 11:44 AM

## 2021-02-01 NOTE — Progress Notes (Signed)
At 07:58am, notified OB of hematuria, labs, hypoglycemic event, vomiting, pain reported 8/10 in abdomen/cramping. OB/Dr. Despina Hidden at bedside. Fentanyl ordered one time dose for pain. Continue to monitor hematuria. PRN antiemetics to be given as needed.  At 10:55am, notified OB of pain returning and irritability and ctx on monitor. 2nd dose Fentanyl ordered. Pt fell asleep and reported not needing 2nd dose of Fentanyl with cramping and nausea improved. FHR reassuring with accelerations. Fetal movement reported.  At 11:36am electronic page/chat sent to Hospitalist Dr. Margo Aye reporting of hypgoglycemic event earlier in morning and patient with inability to eat. RN concern for blood sugar to drop with patient not consuming meals. Provider ordered for insulin to be titrated down to 0.2. Continue to monitor.  At 1413, blood sugars elevated from 121 to 162. The next 3 blood sugars at 1 hr checks were also elevated 154, 154, and 153. CBG at 149 to verify if meter needed to be recalibrated per endotool recommendations. All connections tightened and patient had not consumed any full meals. Pt had few sips of glucerna shake and unsweetened applesauce. At 1815 recheck, blood sugars returned down to 125. Continue to monitor

## 2021-02-01 NOTE — Progress Notes (Signed)
Hypoglycemic Event  CBG: 69  Treatment: per endo tool recheck insulin line to make sure clamped and look for alternate factors. Start dextrose. Pt already infusing 18ml Dextrose 10%. Applejuice given (pt unable to take in solid foods due to hyperemesis. Breakfast ordered. Insulin line clamped at 07:35am due to BS=76, continue clamped through next checks and until further notice.  Symptoms: None   Follow-up CBG: Time:0820 CBG Result:78  Possible Reasons for Event: Inadequate meal intake   Comments/MD notified:MD (Dr. Despina Hidden) notified at time of event, continue current protocol via endotool instructions. Continue to monitor. Next recheck due at 09:30am. Pt resting comfortably.    Ashley Camacho

## 2021-02-01 NOTE — Progress Notes (Signed)
Of note Patient's CBG = 99. Free Style Libre = 107. Insulin gtt titrated per CBG read, per endo tool patient meets criteria for Q2 hour checks/titration.

## 2021-02-02 ENCOUNTER — Inpatient Hospital Stay (HOSPITAL_COMMUNITY): Payer: Medicaid Other

## 2021-02-02 LAB — COMPREHENSIVE METABOLIC PANEL
ALT: 71 U/L — ABNORMAL HIGH (ref 0–44)
AST: 49 U/L — ABNORMAL HIGH (ref 15–41)
Albumin: 2.3 g/dL — ABNORMAL LOW (ref 3.5–5.0)
Alkaline Phosphatase: 83 U/L (ref 38–126)
Anion gap: 11 (ref 5–15)
BUN: 20 mg/dL (ref 6–20)
CO2: 24 mmol/L (ref 22–32)
Calcium: 8.4 mg/dL — ABNORMAL LOW (ref 8.9–10.3)
Chloride: 100 mmol/L (ref 98–111)
Creatinine, Ser: 1.11 mg/dL — ABNORMAL HIGH (ref 0.44–1.00)
GFR, Estimated: >60 mL/min (ref >60)
Glucose, Bld: 117 mg/dL — ABNORMAL HIGH (ref 70–99)
Potassium: 3.7 mmol/L (ref 3.5–5.1)
Sodium: 135 mmol/L (ref 135–145)
Total Bilirubin: 0.9 mg/dL (ref 0.3–1.2)
Total Protein: 4.6 g/dL — ABNORMAL LOW (ref 6.5–8.1)

## 2021-02-02 LAB — CBC
HCT: 25 % — ABNORMAL LOW (ref 36.0–46.0)
Hemoglobin: 8.2 g/dL — ABNORMAL LOW (ref 12.0–15.0)
MCH: 30.9 pg (ref 26.0–34.0)
MCHC: 32.8 g/dL (ref 30.0–36.0)
MCV: 94.3 fL (ref 80.0–100.0)
Platelets: 104 10*3/uL — ABNORMAL LOW (ref 150–400)
RBC: 2.65 MIL/uL — ABNORMAL LOW (ref 3.87–5.11)
RDW: 16.1 % — ABNORMAL HIGH (ref 11.5–15.5)
WBC: 7.3 10*3/uL (ref 4.0–10.5)
nRBC: 0 % (ref 0.0–0.2)

## 2021-02-02 LAB — IRON AND TIBC
Iron: 88 ug/dL (ref 28–170)
Saturation Ratios: 32 % — ABNORMAL HIGH (ref 10.4–31.8)
TIBC: 272 ug/dL (ref 250–450)
UIBC: 184 ug/dL

## 2021-02-02 LAB — MAGNESIUM: Magnesium: 2 mg/dL (ref 1.7–2.4)

## 2021-02-02 LAB — GLUCOSE, CAPILLARY: Glucose-Capillary: 122 mg/dL — ABNORMAL HIGH (ref 70–99)

## 2021-02-02 LAB — FERRITIN: Ferritin: 670 ng/mL — ABNORMAL HIGH (ref 11–307)

## 2021-02-02 LAB — HEPARIN LEVEL (UNFRACTIONATED)
Heparin Unfractionated: 0.8 IU/mL — ABNORMAL HIGH (ref 0.30–0.70)
Heparin Unfractionated: 0.76 IU/mL — ABNORMAL HIGH (ref 0.30–0.70)

## 2021-02-02 NOTE — Progress Notes (Signed)
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  Medications:  Scheduled:   sodium chloride   Intravenous Once   Chlorhexidine Gluconate Cloth  6 each Topical Daily   cholecalciferol  1,000 Units Oral Daily   vitamin B-6  12.5 mg Oral BID   And   doxylamine (Sleep)  12.5 mg Oral BID   DULoxetine  20 mg Oral Daily   famotidine  20 mg Oral BID   feeding supplement (GLUCERNA SHAKE)  237 mL Oral BID BM   hydrALAZINE  75 mg Oral Q8H   hydrocortisone sod succinate (SOLU-CORTEF) inj  40 mg Intravenous Daily   mouth rinse  15 mL Mouth Rinse q12n4p   metoCLOPramide (REGLAN) injection  5 mg Intravenous Q6H   pantoprazole  40 mg Oral Daily   prenatal vitamin w/FE, FA  1 tablet Oral Q1200   sodium chloride  1 spray Each Nare Q2H   sodium chloride flush  10-40 mL Intracatheter Q12H   sucralfate  1 g Oral TID WC & HS   terbutaline  0.25 mg Subcutaneous Once   vitamin B-12  1,000 mcg Oral Daily   Infusions:   dextrose 25 mL/hr at 02/01/21 1322   heparin 1,500 Units/hr (02/02/21 0358)   insulin 0.8 Units/hr (02/02/21 0552)    Assessment: Ashley Camacho at [redacted]w[redacted]d gestation on heparin drip for right brachial VTE. Platelets today are 104, with a slight downtrend. Will continue to follow. Heparin level this AM at 0458 is slightly  supra-therapeutic at 0.8 units/mL.    Goal of Therapy:  Heparin level 0.3-0.7 units/ml Monitor platelets by anticoagulation protocol: Yes   Plan:  Decrease heparin infusion rate to 1400 units/hr (~1.5 units/kg/h reduction). Will recheck heparin level ~6h after rate change tonight at 1700. Continue to monitor heparin levels and platelets daily.  [redacted]w[redacted]d, PharmD, MHSA, BCPPS 02/02/2021,9:48 AM

## 2021-02-02 NOTE — Progress Notes (Signed)
Patient ID: Ashley Camacho, female   DOB: 06/16/1990, 30 y.o.   MRN: [id number]  FACULTY PRACTICE ANTEPARTUM(COMPREHENSIVE) NOTE  SHAYLENE PAGANELLI is a 30 y.o. G2P0101 with Estimated Date of Delivery: 03/30/21   By   [redacted]w[redacted]d who is admitted for management of medical issues related to T! DM, historically poorly controlled.    Fetal presentation is cephalic. Length of Stay:  48  Days  Date of admission:12/16/2020  Subjective: Pelvic pressure cramping radiating to her back Patient reports the fetal movement as active. Patient reports uterine contraction  activity as irregular Patient reports  vaginal bleeding as none. Patient describes fluid per vagina as None.  Vitals:  Blood pressure (!) 145/64, pulse 86, temperature 98.1 F (36.7 C), temperature source Oral, resp. rate 16, height 5\' 1"  (1.549 m), weight 80.6 kg, SpO2 98 %. Vitals:   02/01/21 1530 02/01/21 1939 02/02/21 0215 02/02/21 0600  BP: (!) 149/65 (!) 149/67 (!) 150/67 (!) 145/64  Pulse: (!) 101 98 94 86  Resp: 17 18 16 16   Temp:  98 F (36.7 C) 98.8 F (37.1 C) 98.1 F (36.7 C)  TempSrc:  Oral Oral Oral  SpO2: 95% 97% 97% 98%  Weight:      Height:       Physical Examination:  General appearance - more uncomfortable today Abdomen - soft, nontender, nondistended, no masses or organomegaly Fundal Height:  size equals dates Pelvic Exam:  examination not indicated Cervical Exam: Not evaluated. Extremities: extremities normal, atraumatic, no cyanosis or edema with DTRs 2+ bilaterally Membranes:intact  Fetal Monitoring:  Baseline: 140s bpm, Variability: Good {> 6 bpm), Accelerations: Reactive, and Decelerations: Absent   reactive There is uterine activity on the EFM today  Labs:  Results for orders placed or performed during the hospital encounter of 12/16/20 (from the past 24 hour(s))  Glucose, capillary   Collection Time: 02/01/21  5:23 PM  Result Value Ref Range   Glucose-Capillary 149 (H) 70 - 99 mg/dL  Heparin level  (unfractionated)   Collection Time: 02/02/21  4:58 AM  Result Value Ref Range   Heparin Unfractionated 0.80 (H) 0.30 - 0.70 IU/mL  CBC   Collection Time: 02/02/21  4:58 AM  Result Value Ref Range   WBC 7.3 4.0 - 10.5 K/uL   RBC 2.65 (L) 3.87 - 5.11 MIL/uL   Hemoglobin 8.2 (L) 12.0 - 15.0 g/dL   HCT 04/04/21 (L) 04/04/21 - 77.9 %   MCV 94.3 80.0 - 100.0 fL   MCH 30.9 26.0 - 34.0 pg   MCHC 32.8 30.0 - 36.0 g/dL   RDW 39.0 (H) 30.0 - 92.3 %   Platelets 104 (L) 150 - 400 K/uL   nRBC 0.0 0.0 - 0.2 %  Comprehensive metabolic panel   Collection Time: 02/02/21  4:58 AM  Result Value Ref Range   Sodium 135 135 - 145 mmol/L   Potassium 3.7 3.5 - 5.1 mmol/L   Chloride 100 98 - 111 mmol/L   CO2 24 22 - 32 mmol/L   Glucose, Bld 117 (H) 70 - 99 mg/dL   BUN 20 6 - 20 mg/dL   Creatinine, Ser 76.2 (H) 0.44 - 1.00 mg/dL   Calcium 8.4 (L) 8.9 - 10.3 mg/dL   Total Protein 4.6 (L) 6.5 - 8.1 g/dL   Albumin 2.3 (L) 3.5 - 5.0 g/dL   AST 49 (H) 15 - 41 U/L   ALT 71 (H) 0 - 44 U/L   Alkaline Phosphatase 83 38 - 126 U/L  Total Bilirubin 0.9 0.3 - 1.2 mg/dL   GFR, Estimated >75 >10 mL/min   Anion gap 11 5 - 15  Magnesium   Collection Time: 02/02/21  4:58 AM  Result Value Ref Range   Magnesium 2.0 1.7 - 2.4 mg/dL  Iron and TIBC   Collection Time: 02/02/21  4:58 AM  Result Value Ref Range   Iron 88 28 - 170 ug/dL   TIBC 258 527 - 782 ug/dL   Saturation Ratios 32 (H) 10.4 - 31.8 %   UIBC 184 ug/dL  Ferritin   Collection Time: 02/02/21  4:58 AM  Result Value Ref Range   Ferritin 670 (H) 11 - 307 ng/mL  Glucose, capillary   Collection Time: 02/02/21  6:02 AM  Result Value Ref Range   Glucose-Capillary 122 (H) 70 - 99 mg/dL    Imaging Studies:    No results found.   Medications:  Scheduled  sodium chloride   Intravenous Once   Chlorhexidine Gluconate Cloth  6 each Topical Daily   cholecalciferol  1,000 Units Oral Daily   vitamin B-6  12.5 mg Oral BID   And   doxylamine (Sleep)  12.5 mg Oral  BID   DULoxetine  20 mg Oral Daily   famotidine  20 mg Oral BID   feeding supplement (GLUCERNA SHAKE)  237 mL Oral BID BM   hydrALAZINE  75 mg Oral Q8H   hydrocortisone sod succinate (SOLU-CORTEF) inj  40 mg Intravenous Daily   mouth rinse  15 mL Mouth Rinse q12n4p   metoCLOPramide (REGLAN) injection  5 mg Intravenous Q6H   pantoprazole  40 mg Oral Daily   prenatal vitamin w/FE, FA  1 tablet Oral Q1200   sodium chloride  1 spray Each Nare Q2H   sodium chloride flush  10-40 mL Intracatheter Q12H   sucralfate  1 g Oral TID WC & HS   terbutaline  0.25 mg Subcutaneous Once   vitamin B-12  1,000 mcg Oral Daily   I have reviewed the patient's current medications.  ASSESSMENT: G2P0101 60w0dEstimated Date of Delivery: 03/30/21  Patient Active Problem List   Diagnosis Date Noted   Pregnancy with 31 completed weeks gestation    Acute esophagitis    Leg edema    History of preterm delivery, currently pregnant    Pre-existing type 1 diabetes mellitus during pregnancy in third trimester    Anemia during pregnancy    Type 1 diabetes mellitus affecting pregnancy in second trimester, antepartum    HFrEF (heart failure with reduced ejection fraction) (HCC)    Acute deep vein thrombosis (DVT) of brachial vein of right upper extremity (HCC) 12/17/2020   Anxiety 12/17/2020   Diabetic retinopathy (HCC) 12/17/2020   Acute on chronic combined systolic and diastolic CHF (congestive heart failure) (HCC)    Hyperemesis 12/13/2020   DKA, type 1, not at goal [facility]) 12/13/2020   Sunburn 12/13/2020   Hypothyroidism 12/10/2020   Malnutrition (HCC) 12/10/2020   Vitreous hemorrhage of right eye (HCC) 12/10/2020   Pleural effusion associated with pulmonary infection 11/25/2020   Pneumonia affecting pregnancy 11/24/2020   Diabetes mellitus affecting pregnancy, second trimester 11/24/2020   Edema 11/24/2020   Decreased urine output 11/24/2020   Shortness of breath    Zinc deficiency    SOB (shortness of  breath)    Social problem 11/21/2020   Nausea and vomiting during pregnancy prior to [redacted] weeks gestation 11/21/2020   Low serum vitamin B12    Delivery with history of C-section  11/20/2020   Absolute anemia    Acute kidney injury (HCC)    Nausea and vomiting during pregnancy 10/23/2020   History of premature delivery, currently pregnant, second trimester 10/13/2020   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   Depressive disorder 09/28/2018   Neuropathy 09/28/2018   Gastroparesis due to DM (HCC) 04/22/2016   Diabetic sensorimotor neuropathy (HCC) 04/19/2016   DKA (diabetic ketoacidoses) 04/03/2016   Microalbuminuria 09/26/2005    PLAN: No substanitive change in care plan OB Care             - BPP weekly - Last on 9/6 - 8/8, cephalic, nl afi, NST reactive - Growth q 4 wks, last completed 8/22 - S/p BMZ 8/23-24 - Had Mag 1g (around 28-29w) x6h but stopped due to elevated Mag level - MFM following- see their prior note for details - Has intermittent Braxton Hicks ctxns - no indication to recheck cervix at this time   GI N/V/Esophagitis - Tolerating diet, Glucerna  - Continue pepcid, protonix, carafate. Scheduled anti-emetic is Reglan and zofran - S/p EGD with GI on 8/24 Malnutrition - Dietician on board - B12 replacement per IM - On daily vitamin   Endo T1DM - on Endotool. 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 Facaelis in the past that was pan sensitive. Will presume this is the cause for the hematuria again given her hisotry. Amox 500 bid started for 7d             - Lasix on hold per primary team   Heme Anemia - Work up per IM, was stable until hematuria. Recheck at 1700 planned - Fecal occult positive- GI aware, in s/o pregnancy.  No colonoscopy at this time - If Hgb worsens and assessment/intervention is warranted may reconsider  - HgB appropriate after 2u PRBC over the weekend. 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 We will continue to watch her closely for this. LFTs continued to normalize - If concern for delivery, anesthesia team recommends holding heparin drip 4-6 hours and check a PTT. If unable to do so due to emergency she would have general anesthesia.    - s/p cardiology consult: CHF stable   Maternal Care - tylenol prn - encourage ambulation, PT/OT on board   Plan as outlined above, continue with supportive care with co-management per IM.  Goal for delivery @ 34wk via Repeat C-section and bilateral tubal sterilization if maternal/fetal well being remains stable. Medicaid tubal papers signed and on her paper chart. She and I discussed BTL again on Wednesday and she reiterated her desire for BTL. Will address again at the time  of delivery.    Continue routine antenatal care.  Amaryllis Dyke Magon Croson 02/02/2021,7:49 AM

## 2021-02-02 NOTE — Progress Notes (Signed)
Notified by RN that right arm more swollen. Pt denies pain and is able to move right arm normally.   Has swelling of medial right arm at elbow, forearm and distal bicep. No fluctuation or induration.   Ordered doppler vascular u/s to re-evaluate DVT that was dx a few months ago. Pt is on heparin which pharmacy is adjusting as needed.

## 2021-02-02 NOTE — Progress Notes (Signed)
RN called to pt room. Pt c/o cramping and states is worse than at beginning of shift. RN placed EFM. Pt having irregular ctx. Pt given prn procardia  at 0446 and pain medication.

## 2021-02-02 NOTE — Progress Notes (Signed)
ANTICOAGULATION CONSULT NOTE - Follow Up Consult  Pharmacy Consult for Heparin Indication: Right brachial DVT in pregnancy  No Known Allergies  Patient Measurements: Height: 5\' 1"  (154.9 cm) Weight: 80.6 kg (177 lb 9.6 oz) IBW/kg (Calculated) : 47.8 Heparin Dosing Weight: 63.4kg  Vital Signs: Temp: 98.1 F (36.7 C) (09/12 1939) Temp Source: Oral (09/12 1939) BP: 150/64 (09/12 1939) Pulse Rate: 99 (09/12 1939)  Labs: Recent Labs    01/31/21 0428 02/01/21 0513 02/02/21 0458 02/02/21 1723  HGB 8.1* 7.9* 8.2*  --   HCT 25.1* 24.9* 25.0*  --   PLT 120* 107* 104*  --   HEPARINUNFRC 0.62 0.70 0.80* 0.76*  CREATININE  --  1.16* 1.11*  --     Estimated Creatinine Clearance: 71.9 mL/min (A) (by C-G formula based on SCr of 1.11 mg/dL (H)).  Assessment: 29yoF at [redacted]w[redacted]d gestation currently on Heparin drip at 1400units/hr (87ml/hr). Rate decreased previously today and 6 hour level= 0.76 which is mildly supratherapeutic. Will decrease rate to 1300 units/ml (54ml/hr) and recheck heparin level in am with labs as scheduled.  Goal of Therapy:  Heparin level 0.3-0.7 units/ml Monitor platelets by anticoagulation protocol: Yes    Plan:  Decrease Heparin level to 1300units/hr (18ml/hr)  Check level with am labs and adjust accordingly to target goal of 0.3-0.7. Will continue to monitor platelets.  19m 02/02/2021,8:21 PM

## 2021-02-02 NOTE — Progress Notes (Signed)
PROGRESS NOTE  Ashley Camacho QMV:[id number] DOB: May 17, 1991 DOA: 12/16/2020 PCP: Hildred Laser, MD  HPI/Recap of past 24 hours: Ashley Camacho is a 30 y.o. G2P0101 with a history of T1DM, chronic HFrEF who was admitted initially 6/30 to [facility] for nausea and vomiting subsequently transferred to [facility] for rhinovirus pneumonia, discharged 7/20 only to return 7/22 with starvation ketosis and brachial vein DVT. TPN thru PICC was started and ultimately the patient was transferred to [facility] for cortrack placement on 7/26. Further complications detailed below:  7/27 cardiology consulted for cardiomyopathy, PICC line inserted, tube feeding started. 7/29 having recurrent retention, Foley catheter inserted 7/30 PRBC transfused 8/5 PRBC transfused 8/15 UCX pansensitive Enterococcus faecalis. 8/16 cardiology signed off, no follow-up recommended 8/19 nephrology consulted, no follow-up recommended 8/20 PRBC transfused 8/21 rescue course of betamethasone 8/22 Foley catheter reinserted after removal due to retention. 8/23 GI consulted. MFM consulted, nephrology signed off. 8/24 underwent EGD shows severe esophagitis with mild oozing.   Dobbhoff removed patient on oral diet. 8/25 PICC line removed. 8/26 started on steroids for adrenal insufficiency. 8/27 2 episodes of hematemesis.  Started on PPI drip.  GI reconsulted.  Lovenox held. 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 8/30 PICC line reinserted due to poor IV access, difficulty with blood draws and need for IV heparin. 9/9 Hematuria, IV Lasix stopped and started on p.o. amoxicillin 500 mg twice daily due to concern for hemorrhagic cystitis.  Augmentin was stopped on 02/01/21  02/02/21: Worsening hematuria with intermittent cramping and poor oral intake.  Lasix on hold.  Hg 8.2.  Assessment/Plan: Active Problems:   Delivery with history of C-section   Acute kidney injury (HCC)   Hyperemesis   DKA,  type 1, not at goal [facility])   Acute deep vein thrombosis (DVT) of brachial vein of right upper extremity (HCC)   Vitreous hemorrhage of right eye (HCC)   Gastroparesis due to DM (HCC)   Diabetic retinopathy (HCC)   Acute on chronic combined systolic and diastolic CHF (congestive heart failure) (HCC)   Type 1 diabetes mellitus affecting pregnancy in second trimester, antepartum   HFrEF (heart failure with reduced ejection fraction) (HCC)   Anemia during pregnancy   Pre-existing type 1 diabetes mellitus during pregnancy in third trimester   History of preterm delivery, currently pregnant   Leg edema   Acute esophagitis   Pregnancy with 31 completed weeks gestation  Resolved hematemesis due to grade C esophagitis: Noted on EGD 8/24 by Dr. Orvan Falconer.  IV Protonix switched to oral on 01/28/2021. She is on 20 mg p.o. Pepcid twice daily, p.o. Protonix 40 mg daily, and Carafate, continue. No plan to repeat endoscopic evaluation at this time.  Gross hematuria, unclear etiology Initial concern for hemorrhagic cystitis but repeat urine cx was negative.  Augmentin started 9/9 was Dced 9/11 by obgyn Will obtain renal US to assess the bladder and kidneys.  Nausea and vomiting/hyperemesis/gastroparesis in the setting of pregnancy She is on schedule IV Reglan 5 mg every 6 hours and as needed IV Zofran and IV Compazine as needed Nausea this AM with poor oral intake Decrease insulin rate 9/11 Closely monitor for hypoglycemia  Elevated liver chemistries, downtrending Normal T bilirubin.  Does not appear to be hemolyzing. Continue to closely monitor and repeat CMP in the morning  Acute blood loss anemia in the setting of hematuria Hemoglobin 8.2 from 7.9. Continue to closely monitor Transfuse hemoglobin less than 7 Continue to closely  monitor H&H and transfuse as indicated.  Recurrent Hypoglycemia, due to poor oral intake, nausea with vomiting. Continue insulin drip and D10 W at 25 cc/h. Continue to  encourage increase in oral intake. Antiemetics as needed  Anasarca due to severe malnutrition related to hyperemesis as above as well as acute on chronic HFrEF, HTN:  Updated echo showed improved LVEF 45% now up to 50-55%. RV and pulmonary artery pressures wnl. Small pericardial effusion noted without tamponade. Off Lasix 01/30/21  Chronic insomnia She is currently on Unisom 12.5 mg twice daily States she did not get much sleep last night  Acute thrombocytopenia, platelet count downtrending Platelet count downtrending 104 from 107 from 113 from 171 on 01/24/2021. Continue to closely monitor platelet count, patient on heparin drip. Repeat CBC in the morning.  Adrenal insufficiency: ACTH stim test diagnostic.  - Continue hydrocortisone taper as ordered. Plan eventual dosing 20mg  qAM, 10mg  qPM.  - Will need stress dosing at time of delivery per OB. Reasonable dosing initially could be hydrocortisone 50mg  q8h.    Right brachial vein DVT: Dx 12/07/20.  Seems to be recannulating based on repeat U/S.  -Continue therapeutic anticoagulation with heparin drip.   T1DM with gastroparesis and peripheral neuropathy: Brittle control due to sensitivity and inconsistent calorie inputs. HbA1c was 5.8% 11/21/2020 (impacted by mid-late pregnancy).  She is on scheduled IV Reglan We will keep on insulin drip and D10 W as she is approaching her delivery date. Appreciate diabetes coordinator's assistance. Avoid hypoglycemia.  Resolved post repletion: Hypokalemia and hypomagnesemia Potassium 3.9, magnesium 1.8 Replete as indicated  HTN: Mild increase in her BP, continue to closely monitor. Continue p.o. hydralazine 75mg  q8h.  Continue to monitor for preeclampsia.    Resolved acute urinary retention:  Right hydronephrosis is likely physiologic in this setting.  Voiding spontaneously without difficulty at this time.   Resolved nonoliguric AKI, prerenal in the setting of dehydration from poor oral intake,  intake is now improved:  Creatinine 1.11 from 1.16.  GFR greater than 60. Overall nephrotoxic agents Hold off Lasix for now Continue to monitor urine output  IUP:  - MFM recommends rLTCS at 34 weeks (9/20). Continue NSTs/BPP/serial growth scans. Leaving orders in this regard to MFM/OB.  NST on 9/8 was reactive, reassuring.  - Has received BMZ - Procardia given 9/5 and on 01/29/2021 for contractions.  - OB co-managing, appreciate OB/GYN's assistance.   Treated Enterococcus faecalis UTI: Urine culture collected from 01/05/2021, pansensitive, treated with amoxicillin 8/16 - 8/22.   Limited venous access:  - PICC reinserted 8/30.   DVT prophylaxis: Heparin gtt Code Status: Full Family Communication: None at bedside Disposition Plan:  Status is: Inpatient   Remains inpatient appropriate because:IV treatments appropriate due to intensity of illness or inability to take PO   Dispo: The patient is from: Home              Anticipated d/c is to: Home with OB/GYN signs off.              Patient currently is not medically stable to d/c.   Consultants:  OB/GYN Nephrology Cardiology GI    Objective: Vitals:   02/02/21 0215 02/02/21 0600 02/02/21 0803 02/02/21 1200  BP: (!) 150/67 (!) 145/64 (!) 141/61 (!) 121/57  Pulse: 94 86 94 83  Resp: 16 16 18 18   Temp: 98.8 F (37.1 C) 98.1 F (36.7 C) 98.1 F (36.7 C) 98 F (36.7 C)  TempSrc: Oral Oral Oral Oral  SpO2: 97% 98%  99%  Weight:      Height:        Intake/Output Summary (Last 24 hours) at 02/02/2021 1446 Last data filed at 02/02/2021 1254 Gross per 24 hour  Intake 1121.26 ml  Output 1100 ml  Net 21.26 ml   Filed Weights   01/20/21 0605 01/24/21 0603 01/26/21 0530  Weight: 72 kg 81.7 kg 80.6 kg    Exam:  General: 30 y.o. year-old female well-developed well-nourished in no acute stress.  She is alert and oriented x3.  Appears uncomfortable due to abdominal cramping. Cardiovascular: Regular rate and rhythm no rubs or  gallops. Respiratory: Clear to auscultation with no wheezes or rales. Abdomen: Gravid abdomen with normal bowel sounds present. Musculoskeletal: Bilateral lower extremity edema.  2+ pitting edema..  2 out of 4 pulses in all 4 extremities  Skin: No ulcerative lesions noted.   Psychiatry: Mood is appropriate for condition and setting.   Data Reviewed: CBC: Recent Labs  Lab 01/28/21 0350 01/29/21 0810 01/30/21 0916 01/30/21 1707 01/31/21 0428 02/01/21 0513 02/02/21 0458  WBC 8.2   < > 7.7 7.0 7.8 7.8 7.3  NEUTROABS 5.9  --   --   --   --   --   --   HGB 9.0*   < > 8.4* 10.8* 8.1* 7.9* 8.2*  HCT 27.3*   < > 25.7* 33.4* 25.1* 24.9* 25.0*  MCV 92.5   < > 94.5 94.1 95.1 96.9 94.3  PLT 144*   < > 113* 113* 120* 107* 104*   < > = values in this interval not displayed.   Basic Metabolic Panel: Recent Labs  Lab 01/28/21 0350 01/29/21 0810 01/30/21 0916 01/31/21 0612 02/01/21 0513 02/02/21 0458  NA 135 135 136  --  136 135  K 3.3* 3.4* 3.7 3.9 3.8 3.7  CL 101 102 103  --  101 100  CO2 26 27 27   --  26 24  GLUCOSE 92 79 152*  --  108* 117*  BUN 23* 24* 25*  --  24* 20  CREATININE 1.20* 1.17* 1.14*  --  1.16* 1.11*  CALCIUM 8.6* 8.3* 8.0*  --  8.2* 8.4*  MG  --   --   --  1.8  --  2.0   GFR: Estimated Creatinine Clearance: 71.9 mL/min (A) (by C-G formula based on SCr of 1.11 mg/dL (H)). Liver Function Tests: Recent Labs  Lab 01/28/21 0350 01/29/21 0810 01/30/21 0916 02/01/21 0513 02/02/21 0458  AST 62* 44* 36 51* 49*  ALT 92* 76* 62* 72* 71*  ALKPHOS 85 76 76 81 83  BILITOT 0.9 0.8 0.9 0.9 0.9  PROT 4.6* 4.9* 4.6* 4.7* 4.6*  ALBUMIN 2.3* 2.4* 2.3* 2.4* 2.3*   No results for input(s): LIPASE, AMYLASE in the last 168 hours. No results for input(s): AMMONIA in the last 168 hours. Coagulation Profile: No results for input(s): INR, PROTIME in the last 168 hours. Cardiac Enzymes: No results for input(s): CKTOTAL, CKMB, CKMBINDEX, TROPONINI in the last 168 hours. BNP  (last 3 results) No results for input(s): PROBNP in the last 8760 hours. HbA1C: No results for input(s): HGBA1C in the last 72 hours. CBG: Recent Labs  Lab 01/31/21 0747 01/31/21 1152 02/01/21 0531 02/01/21 1723 02/02/21 0602  GLUCAP 87 129* 99 149* 122*   Lipid Profile: No results for input(s): CHOL, HDL, LDLCALC, TRIG, CHOLHDL, LDLDIRECT in the last 72 hours. Thyroid Function Tests: No results for input(s): TSH, T4TOTAL, FREET4, T3FREE, THYROIDAB in the last 72  hours. Anemia Panel: Recent Labs    02/02/21 0458  FERRITIN 670*  TIBC 272  IRON 88   Urine analysis:    Component Value Date/Time   COLORURINE AMBER (A) 01/10/2021 0131   APPEARANCEUR CLOUDY (A) 01/10/2021 0131   APPEARANCEUR Clear 09/18/2020 1153   LABSPEC 1.017 01/10/2021 0131   LABSPEC 1.026 03/01/2013 1844   PHURINE 5.0 01/10/2021 0131   GLUCOSEU NEGATIVE 01/10/2021 0131   GLUCOSEU >=500 03/01/2013 1844   HGBUR NEGATIVE 01/10/2021 0131   BILIRUBINUR NEGATIVE 01/10/2021 0131   BILIRUBINUR neg 10/23/2020 0953   BILIRUBINUR Negative 09/18/2020 1153   BILIRUBINUR Negative 03/01/2013 1844   KETONESUR NEGATIVE 01/10/2021 0131   PROTEINUR NEGATIVE 01/10/2021 0131   UROBILINOGEN 0.2 10/23/2020 0953   NITRITE NEGATIVE 01/10/2021 0131   LEUKOCYTESUR SMALL (A) 01/10/2021 0131   LEUKOCYTESUR Negative 03/01/2013 1844   Sepsis Labs: @LABRCNTIP (procalcitonin:4,lacticidven:4)  ) Recent Results (from the past 240 hour(s))  OB Urine Culture     Status: Abnormal   Collection Time: 01/27/21 11:30 AM   Specimen: OB Clean Catch; Urine  Result Value Ref Range Status   Specimen Description OB CLEAN CATCH  Final   Special Requests NONE  Final   Culture (A)  Final    MULTIPLE SPECIES PRESENT, SUGGEST RECOLLECTION NO GROUP B STREP (S.AGALACTIAE) ISOLATED Performed at [facility], [address] [place], [place] [address]    Report Status 01/28/2021 FINAL  Final  OB Urine Culture     Status: Abnormal    Collection Time: 01/28/21  2:37 PM   Specimen: OB Clean Catch; Urine  Result Value Ref Range Status   Specimen Description OB CLEAN CATCH  Final   Special Requests NONE  Final   Culture (A)  Final    MULTIPLE SPECIES PRESENT, SUGGEST RECOLLECTION NO GROUP B STREP (S.AGALACTIAE) ISOLATED Performed at [facility], [address] [place], [place] [address]    Report Status 01/29/2021 FINAL  Final  Urine Culture     Status: None   Collection Time: 01/30/21  9:21 AM   Specimen: Urine, Catheterized  Result Value Ref Range Status   Specimen Description URINE, CATHETERIZED  Final   Special Requests NONE  Final   Culture   Final    NO GROWTH Performed at [facility], [address] [place], [place] [address]    Report Status 01/31/2021 FINAL  Final      Studies: No results found.  Scheduled Meds:  sodium chloride   Intravenous Once   Chlorhexidine Gluconate Cloth  6 each Topical Daily   cholecalciferol  1,000 Units Oral Daily   vitamin B-6  12.5 mg Oral BID   And   doxylamine (Sleep)  12.5 mg Oral BID   DULoxetine  20 mg Oral Daily   famotidine  20 mg Oral BID   feeding supplement (GLUCERNA SHAKE)  237 mL Oral BID BM   hydrALAZINE  75 mg Oral Q8H   hydrocortisone sod succinate (SOLU-CORTEF) inj  40 mg Intravenous Daily   mouth rinse  15 mL Mouth Rinse q12n4p   metoCLOPramide (REGLAN) injection  5 mg Intravenous Q6H   pantoprazole  40 mg Oral Daily   prenatal vitamin w/FE, FA  1 tablet Oral Q1200   sodium chloride  1 spray Each Nare Q2H   sodium chloride flush  10-40 mL Intracatheter Q12H   sucralfate  1 g Oral TID WC & HS   terbutaline  0.25 mg Subcutaneous Once  vitamin B-12  1,000 mcg Oral Daily    Continuous Infusions:  dextrose 25 mL/hr at 02/01/21 1322   heparin 1,400 Units/hr (02/02/21 0959)   insulin 0.5 Units/hr (02/02/21 1254)     LOS: 48 days     Darlin Drop, MD [facility] Pager [phone]  If 7PM-7AM, please contact  night-coverage [email or web address] Password [facility] 02/02/2021, 2:46 PM

## 2021-02-03 ENCOUNTER — Inpatient Hospital Stay (HOSPITAL_BASED_OUTPATIENT_CLINIC_OR_DEPARTMENT_OTHER): Payer: Medicaid Other

## 2021-02-03 ENCOUNTER — Inpatient Hospital Stay (HOSPITAL_COMMUNITY): Payer: Medicaid Other

## 2021-02-03 DIAGNOSIS — O403XX0 Polyhydramnios, third trimester, not applicable or unspecified: Secondary | ICD-10-CM

## 2021-02-03 DIAGNOSIS — E109 Type 1 diabetes mellitus without complications: Secondary | ICD-10-CM

## 2021-02-03 DIAGNOSIS — O99013 Anemia complicating pregnancy, third trimester: Secondary | ICD-10-CM | POA: Diagnosis not present

## 2021-02-03 DIAGNOSIS — O24013 Pre-existing diabetes mellitus, type 1, in pregnancy, third trimester: Secondary | ICD-10-CM

## 2021-02-03 DIAGNOSIS — Z3A32 32 weeks gestation of pregnancy: Secondary | ICD-10-CM

## 2021-02-03 DIAGNOSIS — O210 Mild hyperemesis gravidarum: Secondary | ICD-10-CM

## 2021-02-03 DIAGNOSIS — D649 Anemia, unspecified: Secondary | ICD-10-CM | POA: Diagnosis not present

## 2021-02-03 DIAGNOSIS — R609 Edema, unspecified: Secondary | ICD-10-CM

## 2021-02-03 LAB — CBC
HCT: 23.4 % — ABNORMAL LOW (ref 36.0–46.0)
Hemoglobin: 7.9 g/dL — ABNORMAL LOW (ref 12.0–15.0)
MCH: 31.2 pg (ref 26.0–34.0)
MCHC: 33.8 g/dL (ref 30.0–36.0)
MCV: 92.5 fL (ref 80.0–100.0)
Platelets: 110 10*3/uL — ABNORMAL LOW (ref 150–400)
RBC: 2.53 MIL/uL — ABNORMAL LOW (ref 3.87–5.11)
RDW: 16.1 % — ABNORMAL HIGH (ref 11.5–15.5)
WBC: 6.5 10*3/uL (ref 4.0–10.5)
nRBC: 0 % (ref 0.0–0.2)

## 2021-02-03 LAB — GLUCOSE, CAPILLARY
Glucose-Capillary: 74 mg/dL (ref 70–99)
Glucose-Capillary: 112 mg/dL — ABNORMAL HIGH (ref 70–99)

## 2021-02-03 LAB — BASIC METABOLIC PANEL
Anion gap: 7 (ref 5–15)
BUN: 18 mg/dL (ref 6–20)
CO2: 26 mmol/L (ref 22–32)
Calcium: 8 mg/dL — ABNORMAL LOW (ref 8.9–10.3)
Chloride: 101 mmol/L (ref 98–111)
Creatinine, Ser: 1.05 mg/dL — ABNORMAL HIGH (ref 0.44–1.00)
GFR, Estimated: >60 mL/min (ref >60)
Glucose, Bld: 219 mg/dL — ABNORMAL HIGH (ref 70–99)
Potassium: 3.4 mmol/L — ABNORMAL LOW (ref 3.5–5.1)
Sodium: 134 mmol/L — ABNORMAL LOW (ref 135–145)

## 2021-02-03 LAB — HEPATIC FUNCTION PANEL
ALT: 70 U/L — ABNORMAL HIGH (ref 0–44)
AST: 49 U/L — ABNORMAL HIGH (ref 15–41)
Albumin: 2.1 g/dL — ABNORMAL LOW (ref 3.5–5.0)
Alkaline Phosphatase: 70 U/L (ref 38–126)
Bilirubin, Direct: 0.2 mg/dL (ref 0.0–0.2)
Indirect Bilirubin: 0.5 mg/dL (ref 0.3–0.9)
Total Bilirubin: 0.7 mg/dL (ref 0.3–1.2)
Total Protein: 4.2 g/dL — ABNORMAL LOW (ref 6.5–8.1)

## 2021-02-03 LAB — HEPARIN LEVEL (UNFRACTIONATED): Heparin Unfractionated: 0.53 IU/mL (ref 0.30–0.70)

## 2021-02-03 LAB — THYROGLOBULIN LEVEL: Thyroglobulin: <2 ng/mL

## 2021-02-03 MED ORDER — LACTATED RINGERS IV BOLUS
250.0000 mL | Freq: Once | INTRAVENOUS | Status: AC
  Administered 2021-02-03: 250 mL via INTRAVENOUS

## 2021-02-03 MED ORDER — POTASSIUM CHLORIDE CRYS ER 20 MEQ PO TBCR
40.0000 meq | EXTENDED_RELEASE_TABLET | Freq: Once | ORAL | Status: AC
  Administered 2021-02-03: 40 meq via ORAL
  Filled 2021-02-03: qty 2

## 2021-02-03 MED ORDER — TERBUTALINE SULFATE 1 MG/ML IJ SOLN
0.2500 mg | Freq: Once | INTRAMUSCULAR | Status: AC
  Administered 2021-02-03: 0.25 mg via SUBCUTANEOUS
  Filled 2021-02-03: qty 1

## 2021-02-03 MED ORDER — POTASSIUM CHLORIDE CRYS ER 20 MEQ PO TBCR
20.0000 meq | EXTENDED_RELEASE_TABLET | Freq: Every day | ORAL | Status: AC
  Administered 2021-02-03 – 2021-02-05 (×3): 20 meq via ORAL
  Filled 2021-02-03 (×3): qty 1

## 2021-02-03 MED ORDER — FUROSEMIDE 10 MG/ML IJ SOLN
20.0000 mg | Freq: Every day | INTRAMUSCULAR | Status: AC
  Administered 2021-02-03 – 2021-02-05 (×3): 20 mg via INTRAVENOUS
  Filled 2021-02-03 (×3): qty 2

## 2021-02-03 NOTE — Progress Notes (Signed)
ANTICOAGULATION CONSULT NOTE - Follow Up Consult  Pharmacy Consult for Heparin Indication:  Right brachial VTE in pregnancy  No Known Allergies  Patient Measurements: Height: 5\' 1"  (154.9 cm) Weight: 80.6 kg (177 lb 9.6 oz) IBW/kg (Calculated) : 47.8 Heparin Dosing Weight: 63.4 kg  Vital Signs: Temp: 97.8 F (36.6 C) (09/13 0804) Temp Source: Oral (09/13 0804) BP: 141/67 (09/13 0804) Pulse Rate: 85 (09/13 0804)  Labs: Recent Labs    02/01/21 0513 02/02/21 0458 02/02/21 1723 02/03/21 0550  HGB 7.9* 8.2*  --  7.9*  HCT 24.9* 25.0*  --  23.4*  PLT 107* 104*  --  110*  HEPARINUNFRC 0.70 0.80* 0.76* 0.53  CREATININE 1.16* 1.11*  --  1.05*    Estimated Creatinine Clearance: 76 mL/min (A) (by C-G formula based on SCr of 1.05 mg/dL (H)).   Medications:  Scheduled:   sodium chloride   Intravenous Once   Chlorhexidine Gluconate Cloth  6 each Topical Daily   cholecalciferol  1,000 Units Oral Daily   vitamin B-6  12.5 mg Oral BID   And   doxylamine (Sleep)  12.5 mg Oral BID   DULoxetine  20 mg Oral Daily   famotidine  20 mg Oral BID   feeding supplement (GLUCERNA SHAKE)  237 mL Oral BID BM   hydrALAZINE  75 mg Oral Q8H   hydrocortisone sod succinate (SOLU-CORTEF) inj  40 mg Intravenous Daily   mouth rinse  15 mL Mouth Rinse q12n4p   metoCLOPramide (REGLAN) injection  5 mg Intravenous Q6H   pantoprazole  40 mg Oral Daily   prenatal vitamin w/FE, FA  1 tablet Oral Q1200   sodium chloride  1 spray Each Nare Q2H   sodium chloride flush  10-40 mL Intracatheter Q12H   sucralfate  1 g Oral TID WC & HS   terbutaline  0.25 mg Subcutaneous Once   vitamin B-12  1,000 mcg Oral Daily   Infusions:   dextrose 25 mL/hr at 02/03/21 0354   heparin 1,300 Units/hr (02/02/21 2151)   insulin 1.1 Units/hr (02/03/21 0806)    Assessment: 29 YOF at [redacted]w[redacted]d gestation on heparin drip at 1300 units/hr for right brachial VTE. Platelets today are low but stable at 110. Will continue to follow.  Heparin level this AM at 0550 (~9h after rate change) is therapeutic at 0.53 units/mL. Last night, medial right arm at elbow, forearm and distal bicep was more swollen, doppler ordered for this morning.    Goal of Therapy:  Heparin level 0.3-0.7 units/ml Monitor platelets by anticoagulation protocol: Yes   Plan:  Continue current heparin infusion rate of 1300 units/hr. Will continue to follow heparin levels daily and will follow doppler results. Continue to monitor heparin levels and platelets daily.   [redacted]w[redacted]d, PharmD, MHSA, BCPPS 02/02/2021,9:48 AM

## 2021-02-03 NOTE — Progress Notes (Addendum)
PROGRESS NOTE  Ashley Camacho DOB: [birth year] DOA: 12/16/2020 PCP: Hildred Laser, MD  HPI/Recap of past 24 hours: Ashley Camacho is a 30 y.o. G2P0101 with a history of T1DM, chronic HFrEF who was admitted initially 6/30 to [facility] for nausea and vomiting subsequently transferred to [facility] for rhinovirus pneumonia, discharged 7/20 only to return 7/22 with starvation ketosis and brachial vein DVT. TPN thru PICC was started and ultimately the patient was transferred to [facility] for cortrack placement on 7/26. Further complications detailed below:  7/27 cardiology consulted for cardiomyopathy, PICC line inserted, tube feeding started. 7/29 having recurrent retention, Foley catheter inserted 7/30 PRBC transfused 8/5 PRBC transfused 8/15 UCX pansensitive Enterococcus faecalis. 8/16 cardiology signed off, no follow-up recommended 8/19 nephrology consulted, no follow-up recommended 8/20 PRBC transfused 8/21 rescue course of betamethasone 8/22 Foley catheter reinserted after removal due to retention. 8/23 GI consulted. MFM consulted, nephrology signed off. 8/24 underwent EGD shows severe esophagitis with mild oozing.   Dobbhoff removed patient on oral diet. 8/25 PICC line removed. 8/26 started on steroids for adrenal insufficiency. 8/27 2 episodes of hematemesis.  Started on PPI drip.  GI reconsulted.  Lovenox held. 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 8/30 PICC line reinserted due to poor IV access, difficulty with blood draws and need for IV heparin. 9/9 Hematuria, IV Lasix stopped and started on p.o. amoxicillin 500 mg twice daily due to concern for hemorrhagic cystitis.  Augmentin was stopped on 02/01/21  02/03/21: Still with hematuria.  Seen and examined at her bedside.  Has worsening anasarca.  Restarted Iv lasix 20 mg daily x 3 days, Kcl po 20 meq x 3 days.  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 Orvan Falconer.  IV Protonix switched to oral on 01/28/2021. Continue 20 mg p.o. Pepcid twice daily, p.o. Protonix 40 mg daily, and Carafate. No plan to repeat endoscopic evaluation at this time.  Persistent Gross hematuria, unclear etiology Initial concern for hemorrhagic cystitis but repeat urine cx was negative.  Augmentin started on 9/9 was Dced on 9/11 by obgyn Renal ultrasound obtained.  Bilateral hydronephrosis Renal ultrasound showed right kidney moderate hydronephrosis, slightly improved compared to recent prior examination, left kidney mild hydronephrosis which is new. Obtain bladder scan to rule out urinary retention.  Hypokalemia Serum potassium 3.4 Repleted orally Magnesium 2.0 Repeat CMP and magnesium level in the morning  Nausea and vomiting/hyperemesis/gastroparesis in the setting of pregnancy She is on schedule IV Reglan 5 mg every 6 hours and as needed IV Zofran and IV Compazine as needed Decreased insulin rate on 9/11 to avoid hypoglycemia with  poor oral intake. Closely monitor for hypoglycemia  Elevated liver chemistries, downtrending Normal T bilirubin.  Does not appear to  be hemolyzing. Continue to closely monitor and repeat CMP in the morning  Acute blood loss anemia in the setting of hematuria Hemoglobin dropped this morning 7.9 from 8.2. Continue to closely monitor Transfuse hemoglobin less than 7 Continue to closely monitor H&H and transfuse as indicated.  Recurrent Hypoglycemia, due to poor oral intake, nausea with vomiting. Continue insulin drip and D10 W at 25 cc/h. Continue to encourage increase in oral intake. Antiemetics as needed  Worsening Anasarca due to severe malnutrition related to hyperemesis as above as well as acute on chronic HFrEF, HTN:  Updated echo showed improved LVEF 45% now up to 50-55%. RV and pulmonary artery pressures wnl. Small pericardial effusion noted without tamponade. Off Lasix 01/30/21, restarted IV lasix 20 mg daily.  Chronic insomnia She is currently on Unisom 12.5 mg twice daily  Acute thrombocytopenia Platelet count uptrending 110 from 104  Continue to closely monitor platelet count, patient on heparin drip. Repeat CBC in the morning.  Adrenal insufficiency: ACTH stim test diagnostic.  - Continue hydrocortisone taper as ordered. Plan eventual dosing 20mg  qAM, 10mg  qPM.  - Will need stress dosing at time of delivery per OB. Reasonable dosing initially could be hydrocortisone 50mg  q8h.    Right brachial vein DVT: Dx 12/07/20.  Seems to be recannulating based on repeat U/S.  -Continue therapeutic anticoagulation with heparin drip.   T1DM with gastroparesis and peripheral neuropathy: Brittle control due to sensitivity and inconsistent calorie inputs. HbA1c was 5.8% 11/21/2020 (impacted by mid-late pregnancy).  She is on scheduled IV Reglan We will keep on insulin drip and D10 W as she is approaching her delivery date. Appreciate diabetes coordinator's assistance. Avoid hypoglycemia.  Resolved post repletion: Hypokalemia and hypomagnesemia Potassium 3.9, magnesium 1.8 Replete as indicated  HTN: Mild increase  in her BP, continue to closely monitor. Continue p.o. hydralazine 75mg  q8h.  Continue to monitor for preeclampsia.    Resolved acute urinary retention:  Right hydronephrosis is likely physiologic in this setting.  Voiding spontaneously without difficulty at this time.   Resolved nonoliguric AKI, prerenal in the setting of dehydration from poor oral intake, intake is now improved:  Creatinine 1.05 from 1.11 from 1.16.  GFR greater than 60. Avoid nephrotoxic agents Monitor back on diuretic Continue to monitor urine output  IUP:  - MFM recommends rLTCS at 34 weeks (9/20). Continue NSTs/BPP/serial growth scans. Leaving orders in this regard to MFM/OB.  NST on 9/8 was reactive, reassuring.  - Has received BMZ - Procardia given 9/5 and on 01/29/2021 for contractions.  - OB co-managing, appreciate OB/GYN's assistance.   Treated Enterococcus faecalis UTI: Urine culture collected from 01/05/2021, pansensitive, treated with amoxicillin 8/16 - 8/22.   Limited venous access:  - PICC reinserted 8/30.   DVT prophylaxis: Heparin gtt Code Status: Full Family Communication: None at bedside Disposition Plan:  Status is: Inpatient   Remains inpatient appropriate because:IV treatments appropriate due to intensity of illness or inability to take PO   Dispo: The patient is from: Home              Anticipated d/c is to: Home with OB/GYN signs off.              Patient currently is not medically stable to d/c.   Consultants:  OB/GYN Nephrology Cardiology GI    Objective: Vitals:   02/03/21 0347 02/03/21 0804 02/03/21 1139 02/03/21  1545  BP: (!) 137/57 (!) 141/67 137/62 (!) 149/65  Pulse: 88 85 88 95  Resp: 18 18 18 18   Temp: 97.9 F (36.6 C) 97.8 F (36.6 C)  98.2 F (36.8 C)  TempSrc: Oral Oral  Oral  SpO2: 99% 99% 99% 99%  Weight:      Height:        Intake/Output Summary (Last 24 hours) at 02/03/2021 1814 Last data filed at 02/03/2021 1744 Gross per 24 hour  Intake 1221.97 ml   Output 1000 ml  Net 221.97 ml   Filed Weights   01/20/21 0605 01/24/21 0603 01/26/21 0530  Weight: 72 kg 81.7 kg 80.6 kg    Exam:  General: 30 y.o. year-old female well-developed well-nourished alert and oriented x3.  [redacted] weeks pregnant.   Cardiovascular: Regular rate and rhythm with no rubs or gallops. Respiratory: Clear to auscultation no wheezes or rales. Abdomen: Gravid abdomen Musculoskeletal: 3+ pitting edema lower extremities bilaterally.   Skin: No ulcerative lesions Psychiatry: Mood is appropriate.   Data Reviewed: CBC: Recent Labs  Lab 01/28/21 0350 01/29/21 0810 01/30/21 1707 01/31/21 0428 02/01/21 0513 02/02/21 0458 02/03/21 0550  WBC 8.2   < > 7.0 7.8 7.8 7.3 6.5  NEUTROABS 5.9  --   --   --   --   --   --   HGB 9.0*   < > 10.8* 8.1* 7.9* 8.2* 7.9*  HCT 27.3*   < > 33.4* 25.1* 24.9* 25.0* 23.4*  MCV 92.5   < > 94.1 95.1 96.9 94.3 92.5  PLT 144*   < > 113* 120* 107* 104* 110*   < > = values in this interval not displayed.   Basic Metabolic Panel: Recent Labs  Lab 01/29/21 0810 01/30/21 0916 01/31/21 0612 02/01/21 0513 02/02/21 0458 02/03/21 0550  NA 135 136  --  136 135 134*  K 3.4* 3.7 3.9 3.8 3.7 3.4*  CL 102 103  --  101 100 101  CO2 27 27  --  26 24 26   GLUCOSE 79 152*  --  108* 117* 219*  BUN 24* 25*  --  24* 20 18  CREATININE 1.17* 1.14*  --  1.16* 1.11* 1.05*  CALCIUM 8.3* 8.0*  --  8.2* 8.4* 8.0*  MG  --   --  1.8  --  2.0  --    GFR: Estimated Creatinine Clearance: 76 mL/min (A) (by C-G formula based on SCr of 1.05 mg/dL (H)). Liver Function Tests: Recent Labs  Lab 01/29/21 0810 01/30/21 0916 02/01/21 0513 02/02/21 0458 02/03/21 0550  AST 44* 36 51* 49* 49*  ALT 76* 62* 72* 71* 70*  ALKPHOS 76 76 81 83 70  BILITOT 0.8 0.9 0.9 0.9 0.7  PROT 4.9* 4.6* 4.7* 4.6* 4.2*  ALBUMIN 2.4* 2.3* 2.4* 2.3* 2.1*   No results for input(s): LIPASE, AMYLASE in the last 168 hours. No results for input(s): AMMONIA in the last 168  hours. Coagulation Profile: No results for input(s): INR, PROTIME in the last 168 hours. Cardiac Enzymes: No results for input(s): CKTOTAL, CKMB, CKMBINDEX, TROPONINI in the last 168 hours. BNP (last 3 results) No results for input(s): PROBNP in the last 8760 hours. HbA1C: No results for input(s): HGBA1C in the last 72 hours. CBG: Recent Labs  Lab 02/01/21 0531 02/01/21 1723 02/02/21 0602 02/03/21 0540 02/03/21 1600  GLUCAP 99 149* 122* 74 112*   Lipid Profile: No results for input(s): CHOL, HDL, LDLCALC, TRIG, CHOLHDL, LDLDIRECT in the last 72  hours. Thyroid Function Tests: No results for input(s): TSH, T4TOTAL, FREET4, T3FREE, THYROIDAB in the last 72 hours. Anemia Panel: Recent Labs    02/02/21 0458  FERRITIN 670*  TIBC 272  IRON 88   Urine analysis:    Component Value Date/Time   COLORURINE AMBER (A) 01/10/2021 0131   APPEARANCEUR CLOUDY (A) 01/10/2021 0131   APPEARANCEUR Clear 09/18/2020 1153   LABSPEC 1.017 01/10/2021 0131   LABSPEC 1.026 03/01/2013 1844   PHURINE 5.0 01/10/2021 0131   GLUCOSEU NEGATIVE 01/10/2021 0131   GLUCOSEU >=500 03/01/2013 1844   HGBUR NEGATIVE 01/10/2021 0131   BILIRUBINUR NEGATIVE 01/10/2021 0131   BILIRUBINUR neg 10/23/2020 0953   BILIRUBINUR Negative 09/18/2020 1153   BILIRUBINUR Negative 03/01/2013 1844   KETONESUR NEGATIVE 01/10/2021 0131   PROTEINUR NEGATIVE 01/10/2021 0131   UROBILINOGEN 0.2 10/23/2020 0953   NITRITE NEGATIVE 01/10/2021 0131   LEUKOCYTESUR SMALL (A) 01/10/2021 0131   LEUKOCYTESUR Negative 03/01/2013 1844   Sepsis Labs: @LABRCNTIP (procalcitonin:4,lacticidven:4)  ) Recent Results (from the past 240 hour(s))  OB Urine Culture     Status: Abnormal   Collection Time: 01/27/21 11:30 AM   Specimen: OB Clean Catch; Urine  Result Value Ref Range Status   Specimen Description OB CLEAN CATCH  Final   Special Requests NONE  Final   Culture (A)  Final    MULTIPLE SPECIES PRESENT, SUGGEST RECOLLECTION NO GROUP  B STREP (S.AGALACTIAE) ISOLATED Performed at [facility], [address], [place], [place]    Report Status 01/28/2021 FINAL  Final  OB Urine Culture     Status: Abnormal   Collection Time: 01/28/21  2:37 PM   Specimen: OB Clean Catch; Urine  Result Value Ref Range Status   Specimen Description OB CLEAN CATCH  Final   Special Requests NONE  Final   Culture (A)  Final    MULTIPLE SPECIES PRESENT, SUGGEST RECOLLECTION NO GROUP B STREP (S.AGALACTIAE) ISOLATED Performed at [facility], [address], [place], [place]    Report Status 01/29/2021 FINAL  Final  Urine Culture     Status: None   Collection Time: 01/30/21  9:21 AM   Specimen: Urine, Catheterized  Result Value Ref Range Status   Specimen Description URINE, CATHETERIZED  Final   Special Requests NONE  Final   Culture   Final    NO GROWTH Performed at [facility], [address], [place], [place]    Report Status 01/31/2021 FINAL  Final      Studies: VAS 04/02/2021 UPPER EXTREMITY VENOUS DUPLEX  Result Date: 02/03/2021 UPPER VENOUS STUDY  Patient Name:  Ashley Camacho  Date of Exam:   02/03/2021 Medical Rec #: 02/05/2021        Accession #:    [id number] Date of Birth: [birth year]       Patient Gender: F Patient Age:   72 years Exam Location:  [facility] Procedure:      VAS [facility] UPPER EXTREMITY VENOUS DUPLEX Referring Phys: [place] --------------------------------------------------------------------------------  Indications: Edema, and follow up right chronic DVT brachial vein Comparison Study: 01/18/2021 right upper extremity venous duplex- chronic right                   brachial vein DVT Performing Technologist: 01/20/2021 MHA, RDMS, RVT, RDCS  Examination Guidelines: A complete evaluation includes B-mode imaging, spectral Doppler, color Doppler, and power Doppler as needed of all accessible portions of each vessel. Bilateral testing  is considered an integral  part of a complete examination. Limited examinations for reoccurring indications may be performed as noted.  Right Findings: +----------+------------+---------+-----------+----------+-------+ RIGHT     CompressiblePhasicitySpontaneousPropertiesSummary +----------+------------+---------+-----------+----------+-------+ IJV           Full       No        Yes                      +----------+------------+---------+-----------+----------+-------+ Subclavian    Full       No        Yes                      +----------+------------+---------+-----------+----------+-------+ Axillary      Full       No        Yes                      +----------+------------+---------+-----------+----------+-------+ Brachial      None       No        No     retracted Chronic +----------+------------+---------+-----------+----------+-------+ Radial        Full                                          +----------+------------+---------+-----------+----------+-------+ Ulnar         Full                                          +----------+------------+---------+-----------+----------+-------+ Cephalic      Full                                          +----------+------------+---------+-----------+----------+-------+ Basilic       Full                                          +----------+------------+---------+-----------+----------+-------+  Left Findings: +----------+------------+---------+-----------+----------+-------+ LEFT      CompressiblePhasicitySpontaneousPropertiesSummary +----------+------------+---------+-----------+----------+-------+ Subclavian               No        Yes                      +----------+------------+---------+-----------+----------+-------+  Summary:  Right: No evidence of acute deep vein thrombosis in the upper extremity. 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 No significant change when  compared to prior study.  Left: No evidence of thrombosis in the subclavian. Bilateral upper extremity veins exhibit pulsatile flow, suggestive of possibly elevated right heart failure. *See table(s) above for measurements and observations.    Preliminary     Scheduled Meds:  sodium chloride   Intravenous Once   Chlorhexidine Gluconate Cloth  6 each Topical Daily   cholecalciferol  1,000 Units Oral Daily   vitamin B-6  12.5 mg Oral BID   And   doxylamine (Sleep)  12.5 mg Oral BID   DULoxetine  20 mg Oral Daily   famotidine  20 mg Oral BID   feeding supplement (GLUCERNA SHAKE)  237 mL Oral BID BM   hydrALAZINE  75 mg Oral Q8H   hydrocortisone sod succinate (SOLU-CORTEF) inj  40 mg Intravenous Daily   mouth rinse  15 mL Mouth Rinse q12n4p   metoCLOPramide (REGLAN) injection  5 mg Intravenous Q6H   pantoprazole  40 mg Oral Daily   prenatal vitamin w/FE, FA  1 tablet Oral Q1200   sodium chloride  1 spray Each Nare Q2H   sodium chloride flush  10-40 mL Intracatheter Q12H   sucralfate  1 g Oral TID WC & HS   terbutaline  0.25 mg Subcutaneous Once   vitamin B-12  1,000 mcg Oral Daily    Continuous Infusions:  dextrose 25 mL/hr at 02/03/21 1653   heparin 1,300 Units/hr (02/03/21 1654)   insulin 2 Units/hr (02/03/21 1658)     LOS: 49 days     Darlin Drop, MD [facility] Pager [phone]  If 7PM-7AM, please contact night-coverage [email or web address] Password [facility] 02/03/2021, 6:14 PM

## 2021-02-03 NOTE — Progress Notes (Signed)
Notified the hospitalist, Dr. Margo Aye, regarding bladder scan results of 130 mL. See new orders.

## 2021-02-03 NOTE — Progress Notes (Signed)
Physical Therapy Treatment Patient Details Name: Ashley Camacho MRN: [id number] DOB: 05/06/1991 Today's Date: 02/03/2021   History of Present Illness 30 y.o. 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 Medical history significant of [redacted]w[redacted]d on presentation, Estimated Date of Delivery: 03/30/21 by by 9 week ultrasound however trying to make it to 34w for planned c-section, Type 1 diabetic, Cardiomyopathy, chronic systolic heart failure, DVT in bilateral arm. H/o recent hospitalizations d/t hyperemesis and sepsis secondary to pneumonia.    PT Comments    Pt able to walk two laps with three seated rest breaks today.  Despite increase in bil LE edema, legs seemed less painful that previous sessions. Pt did have some lower abdominal pressure/cramping at the end of our walk, RN made aware and is going to put her on the fetal monitor to check for contractions.  Pt encouraged to wiggle toes and pump ankles to help with swelling of the feet.  Goals updated.    Recommendations for follow up therapy are one component of a multi-disciplinary discharge planning process, led by the attending physician.  Recommendations may be updated based on patient status, additional functional criteria and insurance authorization.  Follow Up Recommendations  No PT follow up     Equipment Recommendations  None recommended by PT    Recommendations for Other Services       Precautions / Restrictions Precautions Precautions: Other (comment) Precaution Comments: swelling in LEs, pregnancy     Mobility  Bed Mobility Overal bed mobility: Modified Independent             General bed mobility comments: slow    Transfers Overall transfer level: Modified independent Equipment used: 4-wheeled walker Transfers: Sit to/from Stand Sit to Stand: Modified independent (Device/Increase time)          General transfer comment: slow transitions  Ambulation/Gait Ambulation/Gait assistance: Supervision Gait Distance (Feet): 75 Feet (x4 (three seated rest breaks.) Assistive device: 4-wheeled walker Gait Pattern/deviations: Step-through pattern;Wide base of support     General Gait Details: pt with less painful gait pattern today despite increased swelling in LEs.  Pt continues to fatigue and need seated rest on rollator.  Some cramping at the end of our walk, RN made aware and to put her on the monitor.  Speed was better today than previous sessions as well.   Stairs             Wheelchair Mobility    Modified Rankin (Stroke Patients Only)       Balance Overall balance assessment: Mild deficits observed, not formally tested                                          Cognition Arousal/Alertness: Awake/alert Behavior During Therapy: WFL for tasks assessed/performed Overall Cognitive Status: Within Functional Limits for tasks assessed                                        Exercises General Exercises - Lower Extremity Ankle Circles/Pumps: AROM;Both;20 reps    General Comments        Pertinent Vitals/Pain Pain Assessment: Faces Faces Pain Scale: Hurts little more Pain Location: generalized discomfort, some cramping in abdomen after gait (RN made aware) Pain Descriptors / Indicators:  Cramping Pain Intervention(s): Monitored during session;Limited activity within patient's tolerance;Repositioned    Home Living                      Prior Function            PT Goals (current goals can now be found in the care plan section) Acute Rehab PT Goals PT Goal Formulation: With patient Time For Goal Achievement: 02/17/21 Potential to Achieve Goals: Good Progress towards PT goals: Progressing toward goals    Frequency    Min 3X/week      PT Plan Current plan remains appropriate    Co-evaluation               AM-PAC PT "6 Clicks" Mobility   Outcome Measure  Help needed turning from your back to your side while in a flat bed without using bedrails?: None Help needed moving from lying on your back to sitting on the side of a flat bed without using bedrails?: None Help needed moving to and from a bed to a chair (including a wheelchair)?: None Help needed standing up from a chair using your arms (e.g., wheelchair or bedside chair)?: None Help needed to walk in hospital room?: A Little Help needed climbing 3-5 steps with a railing? : A Little 6 Click Score: 22    End of Session   Activity Tolerance: Patient limited by fatigue;Patient limited by pain Patient left: in bed;with call bell/phone within reach;with nursing/sitter in room;Other (comment) (Was going to sit EOB, but with cramping I encouraged feet up) Nurse Communication: Mobility status (cramping, RN to hook up to fetal monitor) PT Visit Diagnosis: Other abnormalities of gait and mobility (R26.89);Muscle weakness (generalized) (M62.81) Pain - Right/Left: Right Pain - part of body: Knee     Time: 1610-9604 PT Time Calculation (min) (ACUTE ONLY): 24 min  Charges:  $Gait Training: 23-37 mins                     Corinna Capra, PT, DPT  Acute Rehabilitation Ortho Tech Supervisor [phone] pager [phone]) [phone] office

## 2021-02-03 NOTE — Progress Notes (Signed)
Right upper extremity venous duplex completed. Refer to "CV Proc" under chart review to view preliminary results.  02/03/2021 11:54 AM Eula Fried., MHA, RVT, RDCS, RDMS

## 2021-02-03 NOTE — Progress Notes (Signed)
Patient ID: KIYOMI Ashley Camacho, female   DOB: 1990-06-29, 30 y.o.   MRN: [id number] Patient ID: ADANNA ZUCKERMAN, female   DOB: [birth year], 30 y.o.   MRN: [id number]  FACULTY PRACTICE ANTEPARTUM(COMPREHENSIVE) NOTE  ELLIANNAH WAYMENT is a 30 y.o. G2P0101 with Estimated Date of Delivery: 03/30/21   By   [redacted]w[redacted]d who is admitted for management of medical issues related to T! DM, historically poorly controlled.    Fetal presentation is cephalic. Length of Stay:  49  Days  Date of admission:12/16/2020  Subjective: Pelvic pressure cramping radiating to her back Patient reports the fetal movement as active. Patient reports uterine contraction  activity as irregular Patient reports  vaginal bleeding as none. Patient describes fluid per vagina as None.  Vitals:  Blood pressure (!) 141/67, pulse 85, temperature 97.8 F (36.6 C), temperature source Oral, resp. rate 18, height 5\' 1"  (1.549 m), weight 80.6 kg, SpO2 99 %. Vitals:   02/02/21 1939 02/03/21 0044 02/03/21 0347 02/03/21 0804  BP: (!) 150/64 (!) 137/59 (!) 137/57 (!) 141/67  Pulse: 99 93 88 85  Resp: 18 16 18 18   Temp: 98.1 F (36.7 C) 97.9 F (36.6 C) 97.9 F (36.6 C) 97.8 F (36.6 C)  TempSrc: Oral Oral Oral Oral  SpO2: 96% 100% 99% 99%  Weight:      Height:       Physical Examination:  General appearance - more uncomfortable today Abdomen - soft, nontender, nondistended, no masses or organomegaly Fundal Height:  size equals dates Pelvic Exam:  examination not indicated Cervical Exam: Not evaluated. Extremities: extremities normal, atraumatic, no cyanosis or edema with DTRs 2+ bilaterally Membranes:intact  Fetal Monitoring:  Baseline: 140s bpm, Variability: Good {> 6 bpm), Accelerations: Reactive, and Decelerations: Absent   reactive There is uterine activity on the EFM today  Labs:  Results for orders placed or performed during the hospital encounter of 12/16/20 (from the past 24 hour(s))  Heparin level (unfractionated)    Collection Time: 02/02/21  5:23 PM  Result Value Ref Range   Heparin Unfractionated 0.76 (H) 0.30 - 0.70 IU/mL  Glucose, capillary   Collection Time: 02/03/21  5:40 AM  Result Value Ref Range   Glucose-Capillary 74 70 - 99 mg/dL  Heparin level (unfractionated)   Collection Time: 02/03/21  5:50 AM  Result Value Ref Range   Heparin Unfractionated 0.53 0.30 - 0.70 IU/mL  Basic metabolic panel   Collection Time: 02/03/21  5:50 AM  Result Value Ref Range   Sodium 134 (L) 135 - 145 mmol/L   Potassium 3.4 (L) 3.5 - 5.1 mmol/L   Chloride 101 98 - 111 mmol/L   CO2 26 22 - 32 mmol/L   Glucose, Bld 219 (H) 70 - 99 mg/dL   BUN 18 6 - 20 mg/dL   Creatinine, Ser 02/05/21 (H) 0.44 - 1.00 mg/dL   Calcium 8.0 (L) 8.9 - 10.3 mg/dL   GFR, Estimated 02/05/21 7.26 mL/min   Anion gap 7 5 - 15  CBC   Collection Time: 02/03/21  5:50 AM  Result Value Ref Range   WBC 6.5 4.0 - 10.5 K/uL   RBC 2.53 (L) 3.87 - 5.11 MIL/uL   Hemoglobin 7.9 (L) 12.0 - 15.0 g/dL   HCT >35 (L) 02/05/21 - 59.7 %   MCV 92.5 80.0 - 100.0 fL   MCH 31.2 26.0 - 34.0 pg   MCHC 33.8 30.0 - 36.0 g/dL   RDW 41.6 (H) 38.4 - 53.6 %   Platelets 110 (  L) 150 - 400 K/uL   nRBC 0.0 0.0 - 0.2 %  Hepatic function panel   Collection Time: 02/03/21  5:50 AM  Result Value Ref Range   Total Protein 4.2 (L) 6.5 - 8.1 g/dL   Albumin 2.1 (L) 3.5 - 5.0 g/dL   AST 49 (H) 15 - 41 U/L   ALT 70 (H) 0 - 44 U/L   Alkaline Phosphatase 70 38 - 126 U/L   Total Bilirubin 0.7 0.3 - 1.2 mg/dL   Bilirubin, Direct 0.2 0.0 - 0.2 mg/dL   Indirect Bilirubin 0.5 0.3 - 0.9 mg/dL    Imaging Studies:    US RENAL  Result Date: 02/02/2021 CLINICAL DATA:  Hematuria.  Patient is [redacted] weeks pregnant. EXAM: RENAL / URINARY TRACT ULTRASOUND COMPLETE COMPARISON:  Renal ultrasound 01/09/2021 and 01/05/2021. FINDINGS: Right Kidney: Renal measurements: 12.6 x 5.7 x 5.4 cm = volume: 201.8 mL. Moderate hydronephrosis, slightly improved compared with recent prior examination. Transverse  diameter of the right renal pelvis is 23.8 mm, previously 32.4 mm. No perinephric fluid collection identified. Left Kidney: Renal measurements: 10.1 x 5.7 x 4.3 cm = volume: 129.4 mL. Mild hydronephrosis, new from previous ultrasound. Transverse diameter of the left renal pelvis 10.7 mm. Bladder: Appears normal for degree of bladder distention. Other: Probable small left pleural effusion.  No evidence of ascites. IMPRESSION: 1. Persistent chronic moderate right-sided hydronephrosis, slightly improved from most recent prior study. 2. New mild left-sided hydronephrosis. 3. No focal perinephric fluid or bladder abnormality identified. Electronically Signed   By: Carey Bullocks M.D.   On: 02/02/2021 16:03     Medications:  Scheduled  sodium chloride   Intravenous Once   Chlorhexidine Gluconate Cloth  6 each Topical Daily   cholecalciferol  1,000 Units Oral Daily   vitamin B-6  12.5 mg Oral BID   And   doxylamine (Sleep)  12.5 mg Oral BID   DULoxetine  20 mg Oral Daily   famotidine  20 mg Oral BID   feeding supplement (GLUCERNA SHAKE)  237 mL Oral BID BM   hydrALAZINE  75 mg Oral Q8H   hydrocortisone sod succinate (SOLU-CORTEF) inj  40 mg Intravenous Daily   mouth rinse  15 mL Mouth Rinse q12n4p   metoCLOPramide (REGLAN) injection  5 mg Intravenous Q6H   pantoprazole  40 mg Oral Daily   potassium chloride  40 mEq Oral Once   prenatal vitamin w/FE, FA  1 tablet Oral Q1200   sodium chloride  1 spray Each Nare Q2H   sodium chloride flush  10-40 mL Intracatheter Q12H   sucralfate  1 g Oral TID WC & HS   terbutaline  0.25 mg Subcutaneous Once   vitamin B-12  1,000 mcg Oral Daily   I have reviewed the patient's current medications.  ASSESSMENT: G2P0101 [id number] Date of Delivery: 03/30/21  Patient Active Problem List   Diagnosis Date Noted   Pregnancy with 31 completed weeks gestation    Acute esophagitis    Leg edema    History of preterm delivery, currently pregnant    Pre-existing  type 1 diabetes mellitus during pregnancy in third trimester    Anemia during pregnancy    Type 1 diabetes mellitus affecting pregnancy in second trimester, antepartum    HFrEF (heart failure with reduced ejection fraction) (HCC)    Acute deep vein thrombosis (DVT) of brachial vein of right upper extremity (HCC) 12/17/2020   Anxiety 12/17/2020   Diabetic retinopathy (HCC) 12/17/2020   Acute on  chronic combined systolic and diastolic CHF (congestive heart failure) (HCC)    Hyperemesis 12/13/2020   DKA, type 1, not at goal [facility]) 12/13/2020   Sunburn 12/13/2020   Hypothyroidism 12/10/2020   Malnutrition (HCC) 12/10/2020   Vitreous hemorrhage of right eye (HCC) 12/10/2020   Pleural effusion associated with pulmonary infection 11/25/2020   Pneumonia affecting pregnancy 11/24/2020   Diabetes mellitus affecting pregnancy, second trimester 11/24/2020   Edema 11/24/2020   Decreased urine output 11/24/2020   Shortness of breath    Zinc deficiency    SOB (shortness of breath)    Social problem 11/21/2020   Nausea and vomiting during pregnancy prior to [redacted] weeks gestation 11/21/2020   Low serum vitamin B12    Delivery with history of C-section 11/20/2020   Absolute anemia    Acute kidney injury (HCC)    Nausea and vomiting during pregnancy 10/23/2020   History of premature delivery, currently pregnant, second trimester 10/13/2020   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   Depressive disorder 09/28/2018   Neuropathy 09/28/2018   Gastroparesis due to DM (HCC) 04/22/2016   Diabetic sensorimotor neuropathy (HCC) 04/19/2016   DKA (diabetic ketoacidoses) 04/03/2016   Microalbuminuria 09/26/2005    PLAN: No substanitive change in care plan OB Care             - BPP weekly - Last on 9/6 - 8/8, cephalic, nl afi, NST reactive - Growth q 4 wks, last completed 8/22 - S/p BMZ 8/23-24 - Had Mag 1g (around 28-29w) x6h but stopped due to elevated Mag level - MFM following- see their prior note for  details - Has intermittent Braxton Hicks ctxns - no indication to recheck cervix at this time   GI N/V/Esophagitis - Tolerating diet, Glucerna  - Continue pepcid, protonix, carafate. Scheduled anti-emetic is Reglan and zofran - S/p EGD with GI on 8/24 Malnutrition - Dietician on board - B12 replacement per IM - On daily vitamin   Endo T1DM - on Endotool. 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 Facaelis in the past that was pan sensitive. Will presume this is the cause for the hematuria again given her hisotry. Amox 500 bid started for 7d             - Lasix on hold per primary team   Heme Anemia - Work up per IM, was stable until hematuria. Recheck at 1700 planned - Fecal occult positive- GI aware, in s/o pregnancy. No colonoscopy at this time - If Hgb worsens and assessment/intervention is warranted may reconsider  - HgB appropriate after 2u PRBC over the weekend. 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 We will continue to watch her closely for this. LFTs continued to normalize - If concern for delivery, anesthesia team recommends holding heparin drip 4-6 hours and check a PTT. If unable to do so due to emergency she  would have general anesthesia.    - s/p cardiology consult: CHF stable   Maternal Care - tylenol prn - encourage ambulation, PT/OT on board   Plan as outlined above, continue with supportive care with co-management per IM.  Goal for delivery @ 34wk via Repeat C-section and bilateral tubal sterilization if maternal/fetal well being remains stable. Medicaid tubal papers signed and on her paper chart. She and I discussed BTL again on Wednesday and she reiterated her desire for BTL. Will address again at the time of delivery.    Continue routine antenatal care.  MFM sonogram today  Lazaro Arms 02/03/2021,10:18 AM

## 2021-02-04 LAB — CBC
HCT: 22.1 % — ABNORMAL LOW (ref 36.0–46.0)
Hemoglobin: 7.4 g/dL — ABNORMAL LOW (ref 12.0–15.0)
MCH: 31.2 pg (ref 26.0–34.0)
MCHC: 33.5 g/dL (ref 30.0–36.0)
MCV: 93.2 fL (ref 80.0–100.0)
Platelets: 106 10*3/uL — ABNORMAL LOW (ref 150–400)
RBC: 2.37 MIL/uL — ABNORMAL LOW (ref 3.87–5.11)
RDW: 16.5 % — ABNORMAL HIGH (ref 11.5–15.5)
WBC: 5.6 10*3/uL (ref 4.0–10.5)
nRBC: 0 % (ref 0.0–0.2)

## 2021-02-04 LAB — COMPREHENSIVE METABOLIC PANEL
ALT: 61 U/L — ABNORMAL HIGH (ref 0–44)
AST: 41 U/L (ref 15–41)
Albumin: 2.1 g/dL — ABNORMAL LOW (ref 3.5–5.0)
Alkaline Phosphatase: 72 U/L (ref 38–126)
Anion gap: 8 (ref 5–15)
BUN: 17 mg/dL (ref 6–20)
CO2: 26 mmol/L (ref 22–32)
Calcium: 8.1 mg/dL — ABNORMAL LOW (ref 8.9–10.3)
Chloride: 102 mmol/L (ref 98–111)
Creatinine, Ser: 1.11 mg/dL — ABNORMAL HIGH (ref 0.44–1.00)
GFR, Estimated: >60 mL/min (ref >60)
Glucose, Bld: 77 mg/dL (ref 70–99)
Potassium: 3.8 mmol/L (ref 3.5–5.1)
Sodium: 136 mmol/L (ref 135–145)
Total Bilirubin: 0.7 mg/dL (ref 0.3–1.2)
Total Protein: 4.2 g/dL — ABNORMAL LOW (ref 6.5–8.1)

## 2021-02-04 LAB — TYPE AND SCREEN
ABO/RH(D): A POS
ABO/RH(D): A POS
Antibody Screen: NEGATIVE
Antibody Screen: NEGATIVE

## 2021-02-04 LAB — HEPARIN LEVEL (UNFRACTIONATED)
Heparin Unfractionated: >1.1 IU/mL — ABNORMAL HIGH (ref 0.30–0.70)
Heparin Unfractionated: 0.55 IU/mL (ref 0.30–0.70)

## 2021-02-04 LAB — MAGNESIUM: Magnesium: 1.9 mg/dL (ref 1.7–2.4)

## 2021-02-04 LAB — GLUCOSE, CAPILLARY: Glucose-Capillary: 76 mg/dL (ref 70–99)

## 2021-02-04 NOTE — Progress Notes (Signed)
PROGRESS NOTE  KAYLEIGHANN PATCH TRR:[id number] DOB: 21-Jun-1990 DOA: 12/16/2020 PCP: Hildred Laser, MD  HPI/Recap of past 24 hours: Ashley Camacho is a 30 y.o. G2P0101 with a history of T1DM, chronic HFrEF who was admitted initially 6/30 to [facility] for nausea and vomiting subsequently transferred to [facility] for rhinovirus pneumonia, discharged 7/20 only to return 7/22 with starvation ketosis and brachial vein DVT. TPN thru PICC was started and ultimately the patient was transferred to [facility] for cortrack placement on 7/26. Further complications detailed below:  7/27 cardiology consulted for cardiomyopathy, PICC line inserted, tube feeding started. 7/29 having recurrent retention, Foley catheter inserted 7/30 PRBC transfused 8/5 PRBC transfused 8/15 UCX pansensitive Enterococcus faecalis. 8/16 cardiology signed off, no follow-up recommended 8/19 nephrology consulted, no follow-up recommended 8/20 PRBC transfused 8/21 rescue course of betamethasone 8/22 Foley catheter reinserted after removal due to retention. 8/23 GI consulted. MFM consulted, nephrology signed off. 8/24 underwent EGD shows severe esophagitis with mild oozing.   Dobbhoff removed patient on oral diet. 8/25 PICC line removed. 8/26 started on steroids for adrenal insufficiency. 8/27 2 episodes of hematemesis.  Started on PPI drip.  GI reconsulted.  Lovenox held. 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 8/30 PICC line reinserted due to poor IV access, difficulty with blood draws and need for IV heparin. 9/9 Hematuria, IV Lasix stopped and started on p.o. amoxicillin 500 mg twice daily due to concern for hemorrhagic cystitis.  Augmentin was stopped on 02/01/21  9/13: Still with hematuria.  Restarted Iv lasix 20 mg daily x 3 days, Kcl po 20 meq x 3 days.  Assessment/Plan: Active Problems:   Delivery with history of C-section   Acute kidney injury (HCC)   Hyperemesis   DKA, type 1, not  at goal [facility])   Acute deep vein thrombosis (DVT) of brachial vein of right upper extremity (HCC)   Vitreous hemorrhage of right eye (HCC)   Gastroparesis due to DM (HCC)   Diabetic retinopathy (HCC)   Acute on chronic combined systolic and diastolic CHF (congestive heart failure) (HCC)   Type 1 diabetes mellitus affecting pregnancy in second trimester, antepartum   HFrEF (heart failure with reduced ejection fraction) (HCC)   Anemia during pregnancy   Pre-existing type 1 diabetes mellitus during pregnancy in third trimester   History of preterm delivery, currently pregnant   Leg edema   Acute esophagitis   Pregnancy with 31 completed weeks gestation  Resolved hematemesis due to grade C esophagitis: Noted on EGD 8/24 by Dr. Orvan Falconer.  IV Protonix switched to oral on 01/28/2021.  Continue Pepcid, Protonix and Carafate No plan to repeat endoscopic evaluation at this time.  Persistent Gross hematuria, unclear etiology Initial concern for hemorrhagic cystitis but repeat urine cx was negative.  Augmentin started on 9/9 was Dced on 9/11 by obgyn Renal ultrasound obtained as discussed below.  Acute urinary retention/bilateral hydronephrosis Retention improved.  Voiding spontaneously.  Renal ultrasound showed right kidney moderate hydronephrosis, slightly improved compared to recent prior examination, left kidney mild hydronephrosis which is new.  Might need to repeat ultrasound in a week or so.  Hypokalemia Resolved  Nausea and vomiting/hyperemesis/gastroparesis in the setting of pregnancy Management per OB. Decreased insulin rate on 9/11 to avoid hypoglycemia with poor oral intake.  According to patient, she has no more nausea and her oral intake is improved and she is consuming about 100% of her lunch and dinner.  Remains on insulin drip. Closely monitor for  hypoglycemia  Elevated liver chemistries, downtrending Normal T bilirubin.  Does not appear to be hemolyzing. Continue to closely  monitor intermittently.  Acute blood loss anemia in the setting of hematuria Hemoglobin is stable between 7 and 8.  Transfuse if drops less than 7.  Recurrent Hypoglycemia, due to poor oral intake, nausea with vomiting. Continue insulin drip and D10. Continue to encourage increase in oral intake. Antiemetics as needed  Worsening Anasarca due to severe malnutrition related to hyperemesis as above as well as acute on chronic HFrEF, HTN:  Updated echo showed improved LVEF 45% now up to 50-55%. RV and pulmonary artery pressures wnl. Small pericardial effusion noted without tamponade. Off Lasix 01/30/21, restarted IV lasix 20 mg daily.  Chronic insomnia She is currently on Unisom 12.5 mg twice daily  Acute thrombocytopenia Stable around 110 area.  Monitor closely while patient is on heparin drip.  Adrenal insufficiency: ACTH stim test diagnostic.  - Continue hydrocortisone taper as ordered. Plan eventual dosing 20mg  qAM, 10mg  qPM.  - Will need stress dosing at time of delivery per OB. Reasonable dosing initially could be hydrocortisone 50mg  q8h.    Right brachial vein DVT: Dx 12/07/20.  Seems to be recannulating based on repeat U/S.  -Continue therapeutic anticoagulation with heparin drip.   T1DM with gastroparesis and peripheral neuropathy: Brittle control due to sensitivity and inconsistent calorie inputs. HbA1c was 5.8% 11/21/2020 (impacted by mid-late pregnancy).  She is on scheduled IV Reglan We will keep on insulin drip and D10 W as she is approaching her delivery date. Appreciate diabetes coordinator's assistance. Avoid hypoglycemia.  Hypokalemia: Resolved.  Hypomagnesemia: Resolved   HTN: Fairly controlled, continue to closely monitor. Continue p.o. hydralazine 75mg  q8h.  Continue to monitor for preeclampsia.    Resolved acute urinary retention:  Right hydronephrosis is likely physiologic in this setting.  Voiding spontaneously without difficulty at this time.   Resolved  nonoliguric AKI, prerenal in the setting of dehydration from poor oral intake, intake is now improved:  Creatinine 1.05 from 1.11 from 1.16.  GFR greater than 60. Avoid nephrotoxic agents Monitor back on diuretic Continue to monitor urine output  IUP:  - MFM recommends rLTCS at 34 weeks (9/20). Continue NSTs/BPP/serial growth scans. Leaving orders in this regard to MFM/OB.  NST on 9/8 was reactive, reassuring.  - Has received BMZ - Procardia given 9/5 and on 01/29/2021 for contractions.  - OB co-managing, appreciate OB/GYN's assistance.   Treated Enterococcus faecalis UTI: Urine culture collected from 01/05/2021, pansensitive, treated with amoxicillin 8/16 - 8/22.   Limited venous access:  - PICC reinserted 8/30.   DVT prophylaxis: Heparin gtt Code Status: Full Family Communication: None at bedside Disposition Plan:  Status is: Inpatient   Remains inpatient appropriate because:IV treatments appropriate due to intensity of illness or inability to take PO   Dispo: The patient is from: Home              Anticipated d/c is to: Home with OB/GYN signs off.              Patient currently is not medically stable to d/c.   Consultants:  OB/GYN Nephrology Cardiology GI  Subjective: Patient seen and examined.  She has no complaints.  No nausea.  Consuming almost 100% of her meals.  Objective: Vitals:   02/03/21 1917 02/04/21 0013 02/04/21 0557 02/04/21 0916  BP: 139/63 (!) 134/55 (!) 143/58 (!) 145/60  Pulse: (!) 104 93 91   Resp: 18 17 18 18   Temp: 98  F (36.7 C) 97.6 F (36.4 C) 98.4 F (36.9 C) 98 F (36.7 C)  TempSrc: Oral Oral Oral Oral  SpO2:  99%  99%  Weight:      Height:        Intake/Output Summary (Last 24 hours) at 02/04/2021 1146 Last data filed at 02/04/2021 1055 Gross per 24 hour  Intake 1581.26 ml  Output 1500 ml  Net 81.26 ml    Filed Weights   01/20/21 0605 01/24/21 0603 01/26/21 0530  Weight: 72 kg 81.7 kg 80.6 kg    Exam: General exam: Appears  calm and comfortable  Respiratory system: Clear to auscultation. Respiratory effort normal. Cardiovascular system: S1 & S2 heard, RRR. No JVD, murmurs, rubs, gallops or clicks. No pedal edema. Gastrointestinal system:  soft and nontender. No organomegaly or masses felt. Normal bowel sounds heard. Central nervous system: Alert and oriented. No focal neurological deficits. Extremities: Symmetric 5 x 5 power. Skin: No rashes, lesions or ulcers.  Psychiatry: Judgement and insight appear normal. Mood & affect appropriate.    Data Reviewed: CBC: Recent Labs  Lab 01/31/21 0428 02/01/21 0513 02/02/21 0458 02/03/21 0550 02/04/21 0530  WBC 7.8 7.8 7.3 6.5 5.6  HGB 8.1* 7.9* 8.2* 7.9* 7.4*  HCT 25.1* 24.9* 25.0* 23.4* 22.1*  MCV 95.1 96.9 94.3 92.5 93.2  PLT 120* 107* 104* 110* 106*    Basic Metabolic Panel: Recent Labs  Lab 01/30/21 0916 01/31/21 0612 02/01/21 0513 02/02/21 0458 02/03/21 0550 02/04/21 0530  NA 136  --  136 135 134* 136  K 3.7 3.9 3.8 3.7 3.4* 3.8  CL 103  --  101 100 101 102  CO2 27  --  26 24 26 26   GLUCOSE 152*  --  108* 117* 219* 77  BUN 25*  --  24* 20 18 17   CREATININE 1.14*  --  1.16* 1.11* 1.05* 1.11*  CALCIUM 8.0*  --  8.2* 8.4* 8.0* 8.1*  MG  --  1.8  --  2.0  --  1.9    GFR: Estimated Creatinine Clearance: 71.9 mL/min (A) (by C-G formula based on SCr of 1.11 mg/dL (H)). Liver Function Tests: Recent Labs  Lab 01/30/21 0916 02/01/21 0513 02/02/21 0458 02/03/21 0550 02/04/21 0530  AST 36 51* 49* 49* 41  ALT 62* 72* 71* 70* 61*  ALKPHOS 76 81 83 70 72  BILITOT 0.9 0.9 0.9 0.7 0.7  PROT 4.6* 4.7* 4.6* 4.2* 4.2*  ALBUMIN 2.3* 2.4* 2.3* 2.1* 2.1*    No results for input(s): LIPASE, AMYLASE in the last 168 hours. No results for input(s): AMMONIA in the last 168 hours. Coagulation Profile: No results for input(s): INR, PROTIME in the last 168 hours. Cardiac Enzymes: No results for input(s): CKTOTAL, CKMB, CKMBINDEX, TROPONINI in the last  168 hours. BNP (last 3 results) No results for input(s): PROBNP in the last 8760 hours. HbA1C: No results for input(s): HGBA1C in the last 72 hours. CBG: Recent Labs  Lab 02/01/21 1723 02/02/21 0602 02/03/21 0540 02/03/21 1600 02/04/21 0537  GLUCAP 149* 122* 74 112* 76    Lipid Profile: No results for input(s): CHOL, HDL, LDLCALC, TRIG, CHOLHDL, LDLDIRECT in the last 72 hours. Thyroid Function Tests: No results for input(s): TSH, T4TOTAL, FREET4, T3FREE, THYROIDAB in the last 72 hours. Anemia Panel: Recent Labs    02/02/21 0458  FERRITIN 670*  TIBC 272  IRON 88    Urine analysis:    Component Value Date/Time   COLORURINE AMBER (A) 01/10/2021 0131  APPEARANCEUR CLOUDY (A) 01/10/2021 0131   APPEARANCEUR Clear 09/18/2020 1153   LABSPEC 1.017 01/10/2021 0131   LABSPEC 1.026 03/01/2013 1844   PHURINE 5.0 01/10/2021 0131   GLUCOSEU NEGATIVE 01/10/2021 0131   GLUCOSEU >=500 03/01/2013 1844   HGBUR NEGATIVE 01/10/2021 0131   BILIRUBINUR NEGATIVE 01/10/2021 0131   BILIRUBINUR neg 10/23/2020 0953   BILIRUBINUR Negative 09/18/2020 1153   BILIRUBINUR Negative 03/01/2013 1844   KETONESUR NEGATIVE 01/10/2021 0131   PROTEINUR NEGATIVE 01/10/2021 0131   UROBILINOGEN 0.2 10/23/2020 0953   NITRITE NEGATIVE 01/10/2021 0131   LEUKOCYTESUR SMALL (A) 01/10/2021 0131   LEUKOCYTESUR Negative 03/01/2013 1844   Sepsis Labs: @LABRCNTIP (procalcitonin:4,lacticidven:4)  ) Recent Results (from the past 240 hour(s))  OB Urine Culture     Status: Abnormal   Collection Time: 01/27/21 11:30 AM   Specimen: OB Clean Catch; Urine  Result Value Ref Range Status   Specimen Description OB CLEAN CATCH  Final   Special Requests NONE  Final   Culture (A)  Final    MULTIPLE SPECIES PRESENT, SUGGEST RECOLLECTION NO GROUP B STREP (S.AGALACTIAE) ISOLATED Performed at [facility], [address] [place], [place] [address]    Report Status 01/28/2021 FINAL  Final  OB Urine Culture      Status: Abnormal   Collection Time: 01/28/21  2:37 PM   Specimen: OB Clean Catch; Urine  Result Value Ref Range Status   Specimen Description OB CLEAN CATCH  Final   Special Requests NONE  Final   Culture (A)  Final    MULTIPLE SPECIES PRESENT, SUGGEST RECOLLECTION NO GROUP B STREP (S.AGALACTIAE) ISOLATED Performed at [facility], [address], [place], [place] [address]    Report Status 01/29/2021 FINAL  Final  Urine Culture     Status: None   Collection Time: 01/30/21  9:21 AM   Specimen: Urine, Catheterized  Result Value Ref Range Status   Specimen Description URINE, CATHETERIZED  Final   Special Requests NONE  Final   Culture   Final    NO GROWTH Performed at [facility], [address], [place], [place] [address]    Report Status 01/31/2021 FINAL  Final      Studies: VAS [place] UPPER EXTREMITY VENOUS DUPLEX  Result Date: 02/03/2021 UPPER VENOUS STUDY  Patient Name:  MIRIELLE EATHERLY  Date of Exam:   02/03/2021 Medical Rec #: [id number]        Accession #:    [id number] Date of Birth: [birth year]       Patient Gender: F Patient Age:   69 years Exam Location:  [facility] Procedure:      VAS [place] UPPER EXTREMITY VENOUS DUPLEX Referring Phys: Trey Paula --------------------------------------------------------------------------------  Indications: Edema, and follow up right chronic DVT brachial vein Comparison Study: 01/18/2021 right upper extremity venous duplex- chronic right                   brachial vein DVT Performing Technologist: Gertie Fey MHA, RDMS, RVT, RDCS  Examination Guidelines: A complete evaluation includes B-mode imaging, spectral Doppler, color Doppler, and power Doppler as needed of all accessible portions of each vessel. Bilateral testing is considered an integral part of a complete examination. Limited examinations for reoccurring indications may be performed as noted.  Right Findings:  +----------+------------+---------+-----------+----------+-------+ RIGHT     CompressiblePhasicitySpontaneousPropertiesSummary +----------+------------+---------+-----------+----------+-------+ IJV           Full       No  Yes                      +----------+------------+---------+-----------+----------+-------+ Subclavian    Full       No        Yes                      +----------+------------+---------+-----------+----------+-------+ Axillary      Full       No        Yes                      +----------+------------+---------+-----------+----------+-------+ Brachial      None       No        No     retracted Chronic +----------+------------+---------+-----------+----------+-------+ Radial        Full                                          +----------+------------+---------+-----------+----------+-------+ Ulnar         Full                                          +----------+------------+---------+-----------+----------+-------+ Cephalic      Full                                          +----------+------------+---------+-----------+----------+-------+ Basilic       Full                                          +----------+------------+---------+-----------+----------+-------+  Left Findings: +----------+------------+---------+-----------+----------+-------+ LEFT      CompressiblePhasicitySpontaneousPropertiesSummary +----------+------------+---------+-----------+----------+-------+ Subclavian               No        Yes                      +----------+------------+---------+-----------+----------+-------+  Summary:  Right: No evidence of acute deep vein thrombosis in the upper extremity. 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 No significant change when compared to prior study.  Left: No evidence of thrombosis in the subclavian. Bilateral upper extremity veins exhibit  pulsatile flow, suggestive of possibly elevated right heart failure. *See table(s) above for measurements and observations.  Diagnosing physician: Coral Else MD Electronically signed by Coral Else MD on 02/03/2021 at 7:10:25 PM.    Final     Scheduled Meds:  sodium chloride   Intravenous Once   Chlorhexidine Gluconate Cloth  6 each Topical Daily   cholecalciferol  1,000 Units Oral Daily   vitamin B-6  12.5 mg Oral BID   And   doxylamine (Sleep)  12.5 mg Oral BID   DULoxetine  20 mg Oral Daily   famotidine  20 mg Oral BID   feeding supplement (GLUCERNA SHAKE)  237 mL Oral BID BM   furosemide  20 mg Intravenous Daily   hydrALAZINE  75 mg Oral Q8H   hydrocortisone sod succinate (SOLU-CORTEF) inj  40 mg Intravenous Daily   mouth rinse  15 mL  Mouth Rinse q12n4p   metoCLOPramide (REGLAN) injection  5 mg Intravenous Q6H   pantoprazole  40 mg Oral Daily   potassium chloride  20 mEq Oral Daily   prenatal vitamin w/FE, FA  1 tablet Oral Q1200   sodium chloride  1 spray Each Nare Q2H   sodium chloride flush  10-40 mL Intracatheter Q12H   sucralfate  1 g Oral TID WC & HS   terbutaline  0.25 mg Subcutaneous Once   vitamin B-12  1,000 mcg Oral Daily    Continuous Infusions:  dextrose 25 mL/hr at 02/03/21 1653   heparin 1,300 Units/hr (02/03/21 1654)   insulin 4.5 Units/hr (02/04/21 1040)     LOS: 50 days     Hughie Closs, MD [facility]  If 7PM-7AM, please contact night-coverage [email or web address] 02/04/2021, 11:46 AM

## 2021-02-04 NOTE — Progress Notes (Signed)
Cbg this a.m was 68 per endotool give 25 ml of d50 and recheck in 15 mins. No symptoms. Will continue to monitor pt and recheck in 15 mins

## 2021-02-04 NOTE — Progress Notes (Signed)
Patient ID: Ashley Camacho, female   DOB: Nov 13, 1990, 30 y.o.   MRN: [id number] [facility] ANTEPARTUM COMPREHENSIVE PROGRESS NOTE  Ashley Camacho is a 30 y.o. G2P0101 at [redacted]w[redacted]d  who is admitted for management of medical issues related to T1 DM in setting of IUP 32 2/7 .   Fetal presentation is cephalic. Length of Stay:  50  Days  Subjective: Pt reports occ ut ctx and pelvic pressure. Denies vaginal bleeding or LOF. Patient reports good fetal movement.  She reports occ uterine contractions, no bleeding and no loss of fluid per vagina.  Vitals:  Blood pressure (!) 145/60, pulse 91, temperature 98 F (36.7 C), temperature source Oral, resp. rate 18, height 5\' 1"  (1.549 m), weight 80.6 kg, SpO2 99 %.  Physical Examination: Lungs clear Heart RRR Abd soft + BS gravid Ext 1-2 + edema  Fetal Monitoring:  Baseline: 140's bpm, Variability: Good {> 6 bpm), Accelerations: Reactive, and Decelerations: Absent  Labs:  Results for orders placed or performed during the hospital encounter of 12/16/20 (from the past 24 hour(s))  Glucose, capillary   Collection Time: 02/03/21  4:00 PM  Result Value Ref Range   Glucose-Capillary 112 (H) 70 - 99 mg/dL  Heparin level (unfractionated)   Collection Time: 02/04/21  5:30 AM  Result Value Ref Range   Heparin Unfractionated >1.10 (H) 0.30 - 0.70 IU/mL  Magnesium   Collection Time: 02/04/21  5:30 AM  Result Value Ref Range   Magnesium 1.9 1.7 - 2.4 mg/dL  CBC   Collection Time: 02/04/21  5:30 AM  Result Value Ref Range   WBC 5.6 4.0 - 10.5 K/uL   RBC 2.37 (L) 3.87 - 5.11 MIL/uL   Hemoglobin 7.4 (L) 12.0 - 15.0 g/dL   HCT 02/06/21 (L) 44.0 - 10.2 %   MCV 93.2 80.0 - 100.0 fL   MCH 31.2 26.0 - 34.0 pg   MCHC 33.5 30.0 - 36.0 g/dL   RDW 72.5 (H) 36.6 - 44.0 %   Platelets 106 (L) 150 - 400 K/uL   nRBC 0.0 0.0 - 0.2 %  Comprehensive metabolic panel   Collection Time: 02/04/21  5:30 AM  Result Value Ref Range   Sodium 136 135 - 145 mmol/L    Potassium 3.8 3.5 - 5.1 mmol/L   Chloride 102 98 - 111 mmol/L   CO2 26 22 - 32 mmol/L   Glucose, Bld 77 70 - 99 mg/dL   BUN 17 6 - 20 mg/dL   Creatinine, Ser 02/06/21 (H) 0.44 - 1.00 mg/dL   Calcium 8.1 (L) 8.9 - 10.3 mg/dL   Total Protein 4.2 (L) 6.5 - 8.1 g/dL   Albumin 2.1 (L) 3.5 - 5.0 g/dL   AST 41 15 - 41 U/L   ALT 61 (H) 0 - 44 U/L   Alkaline Phosphatase 72 38 - 126 U/L   Total Bilirubin 0.7 0.3 - 1.2 mg/dL   GFR, Estimated 4.25 >95 mL/min   Anion gap 8 5 - 15  Glucose, capillary   Collection Time: 02/04/21  5:37 AM  Result Value Ref Range   Glucose-Capillary 76 70 - 99 mg/dL  Heparin level (unfractionated)   Collection Time: 02/04/21  8:35 AM  Result Value Ref Range   Heparin Unfractionated 0.55 0.30 - 0.70 IU/mL  Type and screen [facility] [place] Draw Type and Screen with other 5:00am labs   Collection Time: 02/04/21  8:35 AM  Result Value Ref Range   ABO/RH(D) A POS  Antibody Screen NEG    Sample Expiration      02/07/2021,2359 Performed at [facility], [address], [place], [place] [address]     Imaging Studies:    NA   Medications:  Scheduled  sodium chloride   Intravenous Once   Chlorhexidine Gluconate Cloth  6 each Topical Daily   cholecalciferol  1,000 Units Oral Daily   vitamin B-6  12.5 mg Oral BID   And   doxylamine (Sleep)  12.5 mg Oral BID   DULoxetine  20 mg Oral Daily   famotidine  20 mg Oral BID   feeding supplement (GLUCERNA SHAKE)  237 mL Oral BID BM   furosemide  20 mg Intravenous Daily   hydrALAZINE  75 mg Oral Q8H   hydrocortisone sod succinate (SOLU-CORTEF) inj  40 mg Intravenous Daily   mouth rinse  15 mL Mouth Rinse q12n4p   metoCLOPramide (REGLAN) injection  5 mg Intravenous Q6H   pantoprazole  40 mg Oral Daily   potassium chloride  20 mEq Oral Daily   prenatal vitamin w/FE, FA  1 tablet Oral Q1200   sodium chloride  1 spray Each Nare Q2H   sodium chloride flush  10-40 mL Intracatheter Q12H   sucralfate  1 g  Oral TID WC & HS   terbutaline  0.25 mg Subcutaneous Once   vitamin B-12  1,000 mcg Oral Daily   I have reviewed the patient's current medications.  ASSESSMENT: IUP 32 2/7 weeks Multiple medical problems as outline in chart  PLAN: Stable from an OB stand point. Fetal well being reassuring. Delivery at 34 weeks via c section with BTL. Management of medical problems as per medicine team. Greatly appreciated. Continue routine antenatal care.   Hermina Staggers 02/04/2021,9:51 AM

## 2021-02-04 NOTE — Progress Notes (Signed)
Rechecked cbg 111. Dr Debroah Loop was notified.

## 2021-02-04 NOTE — Progress Notes (Signed)
Occupational Therapy Treatment Patient Details Name: Ashley Camacho MRN: [id number] DOB: 02-25-1991 Today's Date: 02/04/2021   History of present illness 30 y.o. 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 Medical history significant of [redacted]w[redacted]d on presentation, Estimated Date of Delivery: 03/30/21 by by 9 week ultrasound however trying to make it to 34w for planned c-section, Type 1 diabetic, Cardiomyopathy, chronic systolic heart failure, DVT in bilateral arm. H/o recent hospitalizations d/t hyperemesis and sepsis secondary to pneumonia.   OT comments  Pt continues to move slowly, but without physical assistance and manages her IV pole in room. Declined ambulation in hall having just finished her lunch.   Recommendations for follow up therapy are one component of a multi-disciplinary discharge planning process, led by the attending physician.  Recommendations may be updated based on patient status, additional functional criteria and insurance authorization.    Follow Up Recommendations  No OT follow up;Supervision/Assistance - 24 hour    Equipment Recommendations  None recommended by OT    Recommendations for Other Services      Precautions / Restrictions Precautions Precautions: Other (comment) Precaution Comments: swelling in LEs, pregnancy       Mobility Bed Mobility Overal bed mobility: Modified Independent             General bed mobility comments: slow    Transfers Overall transfer level: Modified independent Equipment used: None             General transfer comment: slow transitions    Balance Overall balance assessment: Mild deficits observed, not formally tested                                         ADL either performed or assessed with clinical judgement   ADL Overall ADL's : Needs assistance/impaired     Grooming:  Wash/dry hands;Standing;Supervision/safety                   Toilet Transfer: Supervision/safety;Ambulation   Toileting- Clothing Manipulation and Hygiene: Modified independent;Sit to/from stand       Functional mobility during ADLs: Supervision/safety (pushing IV pole)       Vision       Perception     Praxis      Cognition Arousal/Alertness: Awake/alert Behavior During Therapy: WFL for tasks assessed/performed Overall Cognitive Status: Within Functional Limits for tasks assessed                                          Exercises     Shoulder Instructions       General Comments      Pertinent Vitals/ Pain       Pain Assessment: Faces Faces Pain Scale: Hurts little more Pain Location: generally sore Pain Descriptors / Indicators: Sore  Home Living                                          Prior Functioning/Environment              Frequency  Min 2X/week        Progress Toward Goals  OT Goals(current goals can now be  found in the care plan section)  Progress towards OT goals: Progressing toward goals  Acute Rehab OT Goals Patient Stated Goal: have a healthy baby OT Goal Formulation: With patient Time For Goal Achievement: 02/18/21 Potential to Achieve Goals: Good ADL Goals Pt Will Perform Grooming: Independently;standing Pt Will Perform Lower Body Bathing: Independently;sit to/from stand Pt Will Perform Lower Body Dressing: Independently;sit to/from stand Pt Will Transfer to Toilet: Independently;ambulating;regular height toilet Pt Will Perform Toileting - Clothing Manipulation and hygiene: Independently;sit to/from stand  Plan Discharge plan remains appropriate    Co-evaluation                 AM-PAC OT "6 Clicks" Daily Activity     Outcome Measure   Help from another person eating meals?: None Help from another person taking care of personal grooming?: A Little Help from another person  toileting, which includes using toliet, bedpan, or urinal?: None Help from another person bathing (including washing, rinsing, drying)?: A Little Help from another person to put on and taking off regular upper body clothing?: A Little Help from another person to put on and taking off regular lower body clothing?: A Little 6 Click Score: 20    End of Session    OT Visit Diagnosis: Muscle weakness (generalized) (M62.81);Pain;Feeding difficulties (R63.3)   Activity Tolerance Patient tolerated treatment well   Patient Left in bed;with call bell/phone within reach   Nurse Communication          Time: 1420-1435 OT Time Calculation (min): 15 min  Charges: OT General Charges $OT Visit: 1 Visit OT Treatments $Self Care/Home Management : 8-22 mins  Martie Round, OTR/L Acute Rehabilitation Services Pager: [phone] Office: [phone]  [name] 02/04/2021, 4:32 PM

## 2021-02-05 LAB — COMPREHENSIVE METABOLIC PANEL
ALT: 58 U/L — ABNORMAL HIGH (ref 0–44)
AST: 36 U/L (ref 15–41)
Albumin: 2.2 g/dL — ABNORMAL LOW (ref 3.5–5.0)
Alkaline Phosphatase: 83 U/L (ref 38–126)
Anion gap: 7 (ref 5–15)
BUN: 19 mg/dL (ref 6–20)
CO2: 27 mmol/L (ref 22–32)
Calcium: 8.3 mg/dL — ABNORMAL LOW (ref 8.9–10.3)
Chloride: 104 mmol/L (ref 98–111)
Creatinine, Ser: 1.13 mg/dL — ABNORMAL HIGH (ref 0.44–1.00)
GFR, Estimated: >60 mL/min (ref >60)
Glucose, Bld: 115 mg/dL — ABNORMAL HIGH (ref 70–99)
Potassium: 3.9 mmol/L (ref 3.5–5.1)
Sodium: 138 mmol/L (ref 135–145)
Total Bilirubin: 0.5 mg/dL (ref 0.3–1.2)
Total Protein: 4.6 g/dL — ABNORMAL LOW (ref 6.5–8.1)

## 2021-02-05 LAB — CBC
HCT: 24.9 % — ABNORMAL LOW (ref 36.0–46.0)
Hemoglobin: 7.8 g/dL — ABNORMAL LOW (ref 12.0–15.0)
MCH: 30.4 pg (ref 26.0–34.0)
MCHC: 31.3 g/dL (ref 30.0–36.0)
MCV: 96.9 fL (ref 80.0–100.0)
Platelets: 109 10*3/uL — ABNORMAL LOW (ref 150–400)
RBC: 2.57 MIL/uL — ABNORMAL LOW (ref 3.87–5.11)
RDW: 16.6 % — ABNORMAL HIGH (ref 11.5–15.5)
WBC: 5.4 10*3/uL (ref 4.0–10.5)
nRBC: 0 % (ref 0.0–0.2)

## 2021-02-05 LAB — GLUCOSE, CAPILLARY: Glucose-Capillary: 112 mg/dL — ABNORMAL HIGH (ref 70–99)

## 2021-02-05 LAB — HEPARIN LEVEL (UNFRACTIONATED): Heparin Unfractionated: 0.44 IU/mL (ref 0.30–0.70)

## 2021-02-05 NOTE — Progress Notes (Addendum)
Physical Therapy Treatment Patient Details Name: Ashley Camacho MRN: [id number] DOB: 13-Nov-1990 Today's Date: 02/05/2021   History of Present Illness 30 y.o. 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 Medical history significant of [redacted]w[redacted]d on presentation, Estimated Date of Delivery: 03/30/21 by by 9 week ultrasound however trying to make it to 34w for planned c-section, Type 1 diabetic, Cardiomyopathy, chronic systolic heart failure, DVT in bilateral arm. H/o recent hospitalizations d/t hyperemesis and sepsis secondary to pneumonia.    PT Comments    Pt making good progress. Able to perform amb in hallway today without assistive device. Takes several seated rest breaks. Continue to work toward incr mobility and activity tolerance.    Recommendations for follow up therapy are one component of a multi-disciplinary discharge planning process, led by the attending physician.  Recommendations may be updated based on patient status, additional functional criteria and insurance authorization.  Follow Up Recommendations  No PT follow up     Equipment Recommendations  None recommended by PT    Recommendations for Other Services       Precautions / Restrictions Precautions Precautions: Other (comment) Precaution Comments: swelling in LEs, pregnancy     Mobility  Bed Mobility Overal bed mobility: Modified Independent Bed Mobility: Supine to Sit     Supine to sit: Modified independent (Device/Increase time);HOB elevated     General bed mobility comments: incr time    Transfers Overall transfer level: Modified independent Equipment used: None Transfers: Sit to/from Stand Sit to Stand: Modified independent (Device/Increase time)         General transfer comment: incr time to rise  Ambulation/Gait Ambulation/Gait assistance: Supervision Gait Distance (Feet): 150 Feet  (150 x2, 100 x 1. Sitting rest breaks in between) Assistive device: None Gait Pattern/deviations: Step-through pattern;Wide base of support Gait velocity: decreased Gait velocity interpretation: 1.31 - 2.62 ft/sec, indicative of limited community ambulator General Gait Details: Pt amb without assistive device. Reports fatigue and need to sit and rest.   Stairs             Wheelchair Mobility    Modified Rankin (Stroke Patients Only)       Balance Overall balance assessment: Mild deficits observed, not formally tested                                          Cognition Arousal/Alertness: Awake/alert Behavior During Therapy: WFL for tasks assessed/performed Overall Cognitive Status: Within Functional Limits for tasks assessed                                        Exercises      General Comments        Pertinent Vitals/Pain Pain Assessment: Faces Faces Pain Scale: Hurts little more Pain Location: back Pain Descriptors / Indicators: Grimacing Pain Intervention(s): Limited activity within patient's tolerance    Home Living                      Prior Function            PT Goals (current goals can now be found in the care plan section) Acute Rehab PT Goals Patient Stated Goal: have a healthy baby Progress towards PT goals: Progressing toward  goals    Frequency    Min 2X/week      PT Plan Current plan remains appropriate;Frequency needs to be updated    Co-evaluation              AM-PAC PT "6 Clicks" Mobility   Outcome Measure  Help needed turning from your back to your side while in a flat bed without using bedrails?: None Help needed moving from lying on your back to sitting on the side of a flat bed without using bedrails?: None Help needed moving to and from a bed to a chair (including a wheelchair)?: None Help needed standing up from a chair using your arms (e.g., wheelchair or bedside chair)?:  None Help needed to walk in hospital room?: A Little Help needed climbing 3-5 steps with a railing? : A Little 6 Click Score: 22    End of Session   Activity Tolerance: Patient tolerated treatment well Patient left: in bed;with call bell/phone within reach (sitting EOB) Nurse Communication: Mobility status PT Visit Diagnosis: Other abnormalities of gait and mobility (R26.89);Muscle weakness (generalized) (M62.81)     Time: 1300-1320 PT Time Calculation (min) (ACUTE ONLY): 20 min  Charges:  $Gait Training: 8-22 mins                     [facility] PT Acute Rehabilitation Services Pager [phone] Office [phone]    Angelina Ok [facility] 02/05/2021, 3:01 PM

## 2021-02-05 NOTE — Progress Notes (Signed)
PROGRESS NOTE  Ashley Camacho JJK:[id number] DOB: [birth year] DOA: 12/16/2020 PCP: Hildred Laser, MD  HPI/Recap of past 24 hours: Ashley Camacho is a 30 y.o. G2P0101 with a history of T1DM, chronic HFrEF who was admitted initially 6/30 to [facility] for nausea and vomiting subsequently transferred to [facility] for rhinovirus pneumonia, discharged 7/20 only to return 7/22 with starvation ketosis and brachial vein DVT. TPN thru PICC was started and ultimately the patient was transferred to [facility] for cortrack placement on 7/26. Further complications detailed below:  7/27 cardiology consulted for cardiomyopathy, PICC line inserted, tube feeding started. 7/29 having recurrent retention, Foley catheter inserted 7/30 PRBC transfused 8/5 PRBC transfused 8/15 UCX pansensitive Enterococcus faecalis. 8/16 cardiology signed off, no follow-up recommended 8/19 nephrology consulted, no follow-up recommended 8/20 PRBC transfused 8/21 rescue course of betamethasone 8/22 Foley catheter reinserted after removal due to retention. 8/23 GI consulted. MFM consulted, nephrology signed off. 8/24 underwent EGD shows severe esophagitis with mild oozing.   Dobbhoff removed patient on oral diet. 8/25 PICC line removed. 8/26 started on steroids for adrenal insufficiency. 8/27 2 episodes of hematemesis.  Started on PPI drip.  GI reconsulted.  Lovenox held. 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 8/30 PICC line reinserted due to poor IV access, difficulty with blood draws and need for IV heparin. 9/9 Hematuria, IV Lasix stopped and started on p.o. amoxicillin 500 mg twice daily due to concern for hemorrhagic cystitis.  Augmentin was stopped on 02/01/21  9/13: Still with hematuria.  Restarted Iv lasix 20 mg daily x 3 days, Kcl po 20 meq x 3 days.  Assessment/Plan: Active Problems:   Delivery with history of C-section   Acute kidney injury (HCC)   Hyperemesis   DKA, type 1, not  at goal [facility])   Acute deep vein thrombosis (DVT) of brachial vein of right upper extremity (HCC)   Vitreous hemorrhage of right eye (HCC)   Gastroparesis due to DM (HCC)   Diabetic retinopathy (HCC)   Acute on chronic combined systolic and diastolic CHF (congestive heart failure) (HCC)   Type 1 diabetes mellitus affecting pregnancy in second trimester, antepartum   HFrEF (heart failure with reduced ejection fraction) (HCC)   Anemia during pregnancy   Pre-existing type 1 diabetes mellitus during pregnancy in third trimester   History of preterm delivery, currently pregnant   Leg edema   Acute esophagitis   Pregnancy with 31 completed weeks gestation  Resolved hematemesis due to grade C esophagitis: Noted on EGD 8/24 by Dr. Orvan Falconer.  IV Protonix switched to oral on 01/28/2021.  Continue Pepcid, Protonix and Carafate No plan to repeat endoscopic evaluation at this time.  Gross hematuria, unclear etiology Initial concern for hemorrhagic cystitis but repeat urine cx was negative.  Augmentin started on 9/9 was Dced on 9/11 by obgyn Renal ultrasound obtained as discussed below.  Recommend consulting urology for their opinion.  Acute urinary retention/bilateral hydronephrosis Retention improved.  Voiding spontaneously.  Renal ultrasound showed right kidney moderate hydronephrosis, slightly improved compared to recent prior examination, left kidney mild hydronephrosis which is new.  Might need to repeat ultrasound in a week or so.  Nausea and vomiting/hyperemesis/gastroparesis in the setting of pregnancy Management per OB. Decreased insulin rate on 9/11 to avoid hypoglycemia with poor oral intake.  According to patient, she has no more nausea and her oral intake is improved and she is consuming about 100% of her lunch and dinner.  Remains on insulin drip.  Closely monitor for hypoglycemia  Elevated liver chemistries, downtrending Normal T bilirubin.  Does not appear to be hemolyzing. Continue  to closely monitor intermittently.  Acute blood loss anemia in the setting of hematuria Hemoglobin is stable between 7 and 8.  Transfuse if drops less than 7.  Recurrent Hypoglycemia, due to poor oral intake, nausea with vomiting. Continue insulin drip and D10. Continue to encourage increase in oral intake. Antiemetics as needed  Worsening Anasarca due to severe malnutrition related to hyperemesis as above as well as acute on chronic HFrEF, HTN:  Updated echo showed improved LVEF 45% now up to 50-55%. RV and pulmonary artery pressures wnl. Small pericardial effusion noted without tamponade. Off Lasix 01/30/21, restarted IV lasix 20 mg daily.  Chronic insomnia She is currently on Unisom 12.5 mg twice daily  Acute thrombocytopenia Stable around 110 area.  Monitor closely while patient is on heparin drip.  Adrenal insufficiency: ACTH stim test diagnostic.  - Continue hydrocortisone taper as ordered. Plan eventual dosing 20mg  qAM, 10mg  qPM.  - Will need stress dosing at time of delivery per OB. Reasonable dosing initially could be hydrocortisone 50mg  q8h.    Right brachial vein DVT: Dx 12/07/20.  Seems to be recannulating based on repeat U/S.  -Continue therapeutic anticoagulation with heparin drip.   T1DM with gastroparesis and peripheral neuropathy: Brittle control due to sensitivity and inconsistent calorie inputs. HbA1c was 5.8% 11/21/2020 (impacted by mid-late pregnancy).  She is on scheduled IV Reglan We will keep on insulin drip and D10 W as she is approaching her delivery date. Appreciate diabetes coordinator's assistance. Avoid hypoglycemia.  Hypokalemia: Resolved.  Hypomagnesemia: Resolved   HTN: Fairly controlled, continue to closely monitor. Continue p.o. hydralazine 75mg  q8h.  Continue to monitor for preeclampsia.    Resolved acute urinary retention:  Right hydronephrosis is likely physiologic in this setting.  Voiding spontaneously without difficulty at this time.    Resolved nonoliguric AKI, prerenal in the setting of dehydration from poor oral intake, intake is now improved:  Creatinine 1.05 from 1.11 from 1.16.  GFR greater than 60. Avoid nephrotoxic agents Monitor back on diuretic Continue to monitor urine output  IUP:  - MFM recommends rLTCS at 34 weeks (9/20). Continue NSTs/BPP/serial growth scans. Leaving orders in this regard to MFM/OB.  NST on 9/8 was reactive, reassuring.  - Has received BMZ - Procardia given 9/5 and on 01/29/2021 for contractions.  - OB co-managing, appreciate OB/GYN's assistance.   Treated Enterococcus faecalis UTI: Urine culture collected from 01/05/2021, pansensitive, treated with amoxicillin 8/16 - 8/22.   Limited venous access:  - PICC reinserted 8/30.   DVT prophylaxis: Heparin gtt Code Status: Full Family Communication: None at bedside Disposition Plan:  Status is: Inpatient   Remains inpatient appropriate because:IV treatments appropriate due to intensity of illness or inability to take PO   Dispo: The patient is from: Home              Anticipated d/c is to: Home with OB/GYN signs off.              Patient currently is not medically stable to d/c.   Consultants:  OB/GYN Nephrology Cardiology GI  Subjective: Seen and examined.  She has no complaints.  Objective: Vitals:   02/04/21 1714 02/04/21 2321 02/05/21 0442 02/05/21 0813  BP: (!) 142/66 (!) 147/70 (!) 146/62 (!) 145/64  Pulse: 97 89 92 95  Resp: 16 16 18 17   Temp: 98.1 F (36.7 C) 97.8 F (36.6 C) 98.2  F (36.8 C) 98.2 F (36.8 C)  TempSrc: Oral Oral Oral Oral  SpO2: 99% 99% 99% 96%  Weight:      Height:        Intake/Output Summary (Last 24 hours) at 02/05/2021 1007 Last data filed at 02/05/2021 0825 Gross per 24 hour  Intake 2160.9 ml  Output 2225 ml  Net -64.1 ml    Filed Weights   01/20/21 0605 01/24/21 0603 01/26/21 0530  Weight: 72 kg 81.7 kg 80.6 kg    Exam: General exam: Appears calm and comfortable  Respiratory  system: Clear to auscultation. Respiratory effort normal. Cardiovascular system: S1 & S2 heard, RRR. No JVD, murmurs, rubs, gallops or clicks. No pedal edema. Gastrointestinal system: Abdomen gravid, soft and nontender. No organomegaly or masses felt. Normal bowel sounds heard. Central nervous system: Alert and oriented. No focal neurological deficits. Extremities: Symmetric 5 x 5 power. Skin: No rashes, lesions or ulcers.  Psychiatry: Judgement and insight appear normal. Mood & affect flat. Data Reviewed: CBC: Recent Labs  Lab 02/01/21 0513 02/02/21 0458 02/03/21 0550 02/04/21 0530 02/05/21 0512  WBC 7.8 7.3 6.5 5.6 5.4  HGB 7.9* 8.2* 7.9* 7.4* 7.8*  HCT 24.9* 25.0* 23.4* 22.1* 24.9*  MCV 96.9 94.3 92.5 93.2 96.9  PLT 107* 104* 110* 106* 109*    Basic Metabolic Panel: Recent Labs  Lab 01/31/21 0612 02/01/21 0513 02/02/21 0458 02/03/21 0550 02/04/21 0530 02/05/21 0512  NA  --  136 135 134* 136 138  K 3.9 3.8 3.7 3.4* 3.8 3.9  CL  --  101 100 101 102 104  CO2  --  26 24 26 26 27   GLUCOSE  --  108* 117* 219* 77 115*  BUN  --  24* 20 18 17 19   CREATININE  --  1.16* 1.11* 1.05* 1.11* 1.13*  CALCIUM  --  8.2* 8.4* 8.0* 8.1* 8.3*  MG 1.8  --  2.0  --  1.9  --     GFR: Estimated Creatinine Clearance: 70.6 mL/min (A) (by C-G formula based on SCr of 1.13 mg/dL (H)). Liver Function Tests: Recent Labs  Lab 02/01/21 0513 02/02/21 0458 02/03/21 0550 02/04/21 0530 02/05/21 0512  AST 51* 49* 49* 41 36  ALT 72* 71* 70* 61* 58*  ALKPHOS 81 83 70 72 83  BILITOT 0.9 0.9 0.7 0.7 0.5  PROT 4.7* 4.6* 4.2* 4.2* 4.6*  ALBUMIN 2.4* 2.3* 2.1* 2.1* 2.2*    No results for input(s): LIPASE, AMYLASE in the last 168 hours. No results for input(s): AMMONIA in the last 168 hours. Coagulation Profile: No results for input(s): INR, PROTIME in the last 168 hours. Cardiac Enzymes: No results for input(s): CKTOTAL, CKMB, CKMBINDEX, TROPONINI in the last 168 hours. BNP (last 3  results) No results for input(s): PROBNP in the last 8760 hours. HbA1C: No results for input(s): HGBA1C in the last 72 hours. CBG: Recent Labs  Lab 02/02/21 0602 02/03/21 0540 02/03/21 1600 02/04/21 0537 02/05/21 0649  GLUCAP 122* 74 112* 76 112*    Lipid Profile: No results for input(s): CHOL, HDL, LDLCALC, TRIG, CHOLHDL, LDLDIRECT in the last 72 hours. Thyroid Function Tests: No results for input(s): TSH, T4TOTAL, FREET4, T3FREE, THYROIDAB in the last 72 hours. Anemia Panel: No results for input(s): VITAMINB12, FOLATE, FERRITIN, TIBC, IRON, RETICCTPCT in the last 72 hours.  Urine analysis:    Component Value Date/Time   COLORURINE AMBER (A) 01/10/2021 0131   APPEARANCEUR CLOUDY (A) 01/10/2021 0131   APPEARANCEUR Clear 09/18/2020 1153  LABSPEC 1.017 01/10/2021 0131   LABSPEC 1.026 03/01/2013 1844   PHURINE 5.0 01/10/2021 0131   GLUCOSEU NEGATIVE 01/10/2021 0131   GLUCOSEU >=500 03/01/2013 1844   HGBUR NEGATIVE 01/10/2021 0131   BILIRUBINUR NEGATIVE 01/10/2021 0131   BILIRUBINUR neg 10/23/2020 0953   BILIRUBINUR Negative 09/18/2020 1153   BILIRUBINUR Negative 03/01/2013 1844   KETONESUR NEGATIVE 01/10/2021 0131   PROTEINUR NEGATIVE 01/10/2021 0131   UROBILINOGEN 0.2 10/23/2020 0953   NITRITE NEGATIVE 01/10/2021 0131   LEUKOCYTESUR SMALL (A) 01/10/2021 0131   LEUKOCYTESUR Negative 03/01/2013 1844   Sepsis Labs: @LABRCNTIP (procalcitonin:4,lacticidven:4)  ) Recent Results (from the past 240 hour(s))  OB Urine Culture     Status: Abnormal   Collection Time: 01/27/21 11:30 AM   Specimen: OB Clean Catch; Urine  Result Value Ref Range Status   Specimen Description OB CLEAN CATCH  Final   Special Requests NONE  Final   Culture (A)  Final    MULTIPLE SPECIES PRESENT, SUGGEST RECOLLECTION NO GROUP B STREP (S.AGALACTIAE) ISOLATED Performed at [facility], [address] [place], [place]    Report Status 01/28/2021 FINAL  Final  OB Urine Culture      Status: Abnormal   Collection Time: 01/28/21  2:37 PM   Specimen: OB Clean Catch; Urine  Result Value Ref Range Status   Specimen Description OB CLEAN CATCH  Final   Special Requests NONE  Final   Culture (A)  Final    MULTIPLE SPECIES PRESENT, SUGGEST RECOLLECTION NO GROUP B STREP (S.AGALACTIAE) ISOLATED Performed at [facility], [address] [place], [place]    Report Status 01/29/2021 FINAL  Final  Urine Culture     Status: None   Collection Time: 01/30/21  9:21 AM   Specimen: Urine, Catheterized  Result Value Ref Range Status   Specimen Description URINE, CATHETERIZED  Final   Special Requests NONE  Final   Culture   Final    NO GROWTH Performed at [facility], [address], [place], [place]    Report Status 01/31/2021 FINAL  Final      Studies: No results found.  Scheduled Meds:  sodium chloride   Intravenous Once   Chlorhexidine Gluconate Cloth  6 each Topical Daily   cholecalciferol  1,000 Units Oral Daily   vitamin B-6  12.5 mg Oral BID   And   doxylamine (Sleep)  12.5 mg Oral BID   DULoxetine  20 mg Oral Daily   famotidine  20 mg Oral BID   feeding supplement (GLUCERNA SHAKE)  237 mL Oral BID BM   hydrALAZINE  75 mg Oral Q8H   hydrocortisone sod succinate (SOLU-CORTEF) inj  40 mg Intravenous Daily   mouth rinse  15 mL Mouth Rinse q12n4p   metoCLOPramide (REGLAN) injection  5 mg Intravenous Q6H   pantoprazole  40 mg Oral Daily   prenatal vitamin w/FE, FA  1 tablet Oral Q1200   sodium chloride  1 spray Each Nare Q2H   sodium chloride flush  10-40 mL Intracatheter Q12H   sucralfate  1 g Oral TID WC & HS   terbutaline  0.25 mg Subcutaneous Once   vitamin B-12  1,000 mcg Oral Daily    Continuous Infusions:  dextrose 25 mL/hr at 02/04/21 2022   heparin 1,300 Units/hr (02/05/21 0814)   insulin 1 Units/hr (02/05/21 0832)     LOS: 51 days     02/07/21, MD [facility]  If  7PM-7AM, please contact  night-coverage [email or web address] 02/05/2021, 10:07 AM

## 2021-02-05 NOTE — Progress Notes (Signed)
ANTICOAGULATION CONSULT NOTE - Follow Up Consult  Pharmacy Consult for heparin Indication:  right brachial VTE in pregnancy  No Known Allergies  Patient Measurements: Height: 5\' 1"  (154.9 cm) Weight: 80.6 kg (177 lb 9.6 oz) IBW/kg (Calculated) : 47.8 Heparin Dosing Weight: 63.4 kg  Vital Signs: Temp: 98 F (36.7 C) (09/15 1201) Temp Source: Oral (09/15 1201) BP: 146/58 (09/15 1201) Pulse Rate: 92 (09/15 1201)  Labs: Recent Labs    02/03/21 0550 02/04/21 0530 02/04/21 0835 02/05/21 0512  HGB 7.9* 7.4*  --  7.8*  HCT 23.4* 22.1*  --  24.9*  PLT 110* 106*  --  109*  HEPARINUNFRC 0.53 >1.10* 0.55 0.44  CREATININE 1.05* 1.11*  --  1.13*    Estimated Creatinine Clearance: 70.6 mL/min (A) (by C-G formula based on SCr of 1.13 mg/dL (H)).   Medications:  Scheduled:   sodium chloride   Intravenous Once   Chlorhexidine Gluconate Cloth  6 each Topical Daily   cholecalciferol  1,000 Units Oral Daily   vitamin B-6  12.5 mg Oral BID   And   doxylamine (Sleep)  12.5 mg Oral BID   DULoxetine  20 mg Oral Daily   famotidine  20 mg Oral BID   feeding supplement (GLUCERNA SHAKE)  237 mL Oral BID BM   hydrALAZINE  75 mg Oral Q8H   hydrocortisone sod succinate (SOLU-CORTEF) inj  40 mg Intravenous Daily   mouth rinse  15 mL Mouth Rinse q12n4p   metoCLOPramide (REGLAN) injection  5 mg Intravenous Q6H   pantoprazole  40 mg Oral Daily   prenatal vitamin w/FE, FA  1 tablet Oral Q1200   sodium chloride  1 spray Each Nare Q2H   sodium chloride flush  10-40 mL Intracatheter Q12H   sucralfate  1 g Oral TID WC & HS   terbutaline  0.25 mg Subcutaneous Once   vitamin B-12  1,000 mcg Oral Daily   Infusions:   dextrose 25 mL/hr at 02/05/21 1000   heparin 1,300 Units/hr (02/05/21 0814)   insulin Stopped (02/05/21 1337)    Assessment: 29 YOF at [redacted]w[redacted]d gestation on heparin drip at 1300 units/hr for right brachial VTE. Platelets today are low but stable at 109. Will continue to follow.  Heparin level this AM at 0512 is therapeutic at 0.44 units/mL. 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  Goal of Therapy:  Heparin level 0.3-0.7 units/ml Monitor platelets by anticoagulation protocol: Yes   Plan:  Continue heparin at current rate of 1300 units/hr. Monitor heparin levels daily. Continue to monitor H&H and platelets  10/13 02/05/2021,1:40 PM

## 2021-02-05 NOTE — Consult Note (Signed)
Neonatology Consult to Antenatal Patient: 02/05/2021 2:32 PM    I was requested by Dr Alysia Penna to see this patient in order to provide antenatal counseling due to maternal complications at 32 3/[redacted] weeks gestation.  Ashley Camacho is a 30 y/o G2P1 who was admitted last July for dehydration secondary to nausea and vomiting/starvation ketosis, CHF/heart failurewith reduced ejection fraction,Type 1 DM on insulin, anemia, acute kidney injury, and brachial vein DVT on heparin. She is currently "not" having active labor.  She received a course of BMZ on 8/23 and 8/24.  She will be a repeat C-section at [redacted] weeks gestation.   I spoke with Ashley Camacho.   We discussed in detail what to expect in case of possible delivery of the infant in the next few days including morbidity and mortality at this gestational age, usual delivery room resuscitation including need for oxygen support in the DR.  Discussed possible respiratory complications, IV access, sepsis work-up, NG/OG feedings ( benefits of DBM available which she is interested in since she is not planning to breast feed), hypoglycemia, temperature instability, length of stay and long-term outcome.  She was attentive, aware of what to expect since her fist daughter was born at 29 weeks, and expressed appreciation for my input.  She lives in [place] and aware that infant may be transferred to [facility] when she is more stable.  Thank you for asking [place] to see this patient and allowing [place] to participate in her care.  Please call if there are any further questions.   Ashley Mam, MD (Attending Neonatologist)  Total length of face-to-face or floor/unit time for this encounter was 40 minutes. Counseling and/or coordination of care was greater than fifty percent of the time above.

## 2021-02-05 NOTE — Progress Notes (Signed)
Patient ID: Ashley Camacho, female   DOB: [birth year], 30 y.o.   MRN: [id number] [facility] ANTEPARTUM COMPREHENSIVE PROGRESS NOTE  AVO SCHLACHTER is a 30 y.o. G2P0101 at [redacted]w[redacted]d  who is admitted for management of medical issues related to T1 DM in setting of IUP 32 3/7 weeks.   Fetal presentation is cephalic. Length of Stay:  51  Days  Subjective: Pt without new complaints today Patient reports good fetal movement.  She reports occ uterine contractions, no bleeding and no loss of fluid per vagina.  Vitals:  Blood pressure (!) 145/64, pulse 95, temperature 98.2 F (36.8 C), temperature source Oral, resp. rate 17, height 5\' 1"  (1.549 m), weight 80.6 kg, SpO2 96 %.  Physical Examination: Lungs clear Heart RRR Abd soft + BS gravid, non tender Ext 1-2 + edema  Fetal Monitoring:  Baseline: 140's bpm, Variability: Good {> 6 bpm), Accelerations: Reactive, and Decelerations: Absent  Labs:  Results for orders placed or performed during the hospital encounter of 12/16/20 (from the past 24 hour(s))  Heparin level (unfractionated)   Collection Time: 02/05/21  5:12 AM  Result Value Ref Range   Heparin Unfractionated 0.44 0.30 - 0.70 IU/mL  CBC   Collection Time: 02/05/21  5:12 AM  Result Value Ref Range   WBC 5.4 4.0 - 10.5 K/uL   RBC 2.57 (L) 3.87 - 5.11 MIL/uL   Hemoglobin 7.8 (L) 12.0 - 15.0 g/dL   HCT 02/07/21 (L) 68.3 - 41.9 %   MCV 96.9 80.0 - 100.0 fL   MCH 30.4 26.0 - 34.0 pg   MCHC 31.3 30.0 - 36.0 g/dL   RDW 62.2 (H) 29.7 - 98.9 %   Platelets 109 (L) 150 - 400 K/uL   nRBC 0.0 0.0 - 0.2 %  Comprehensive metabolic panel   Collection Time: 02/05/21  5:12 AM  Result Value Ref Range   Sodium 138 135 - 145 mmol/L   Potassium 3.9 3.5 - 5.1 mmol/L   Chloride 104 98 - 111 mmol/L   CO2 27 22 - 32 mmol/L   Glucose, Bld 115 (H) 70 - 99 mg/dL   BUN 19 6 - 20 mg/dL   Creatinine, Ser 02/07/21 (H) 0.44 - 1.00 mg/dL   Calcium 8.3 (L) 8.9 - 10.3 mg/dL   Total Protein 4.6 (L) 6.5 - 8.1 g/dL    Albumin 2.2 (L) 3.5 - 5.0 g/dL   AST 36 15 - 41 U/L   ALT 58 (H) 0 - 44 U/L   Alkaline Phosphatase 83 38 - 126 U/L   Total Bilirubin 0.5 0.3 - 1.2 mg/dL   GFR, Estimated 9.41 >74 mL/min   Anion gap 7 5 - 15  Glucose, capillary   Collection Time: 02/05/21  6:49 AM  Result Value Ref Range   Glucose-Capillary 112 (H) 70 - 99 mg/dL    Imaging Studies:    NA   Medications:  Scheduled  sodium chloride   Intravenous Once   Chlorhexidine Gluconate Cloth  6 each Topical Daily   cholecalciferol  1,000 Units Oral Daily   vitamin B-6  12.5 mg Oral BID   And   doxylamine (Sleep)  12.5 mg Oral BID   DULoxetine  20 mg Oral Daily   famotidine  20 mg Oral BID   feeding supplement (GLUCERNA SHAKE)  237 mL Oral BID BM   hydrALAZINE  75 mg Oral Q8H   hydrocortisone sod succinate (SOLU-CORTEF) inj  40 mg Intravenous Daily   mouth rinse  15  mL Mouth Rinse q12n4p   metoCLOPramide (REGLAN) injection  5 mg Intravenous Q6H   pantoprazole  40 mg Oral Daily   prenatal vitamin w/FE, FA  1 tablet Oral Q1200   sodium chloride  1 spray Each Nare Q2H   sodium chloride flush  10-40 mL Intracatheter Q12H   sucralfate  1 g Oral TID WC & HS   terbutaline  0.25 mg Subcutaneous Once   vitamin B-12  1,000 mcg Oral Daily   I have reviewed the patient's current medications.  ASSESSMENT: IUP 32 3/7 weeks  Multiple medical problems as outline in chart  PLAN: Stable from an OB standpoint. Fetal well being reassuring. Delivery plan as previously outline in chart. Management of medical problems as per medicine team.  Continue routine antenatal care.   Hermina Staggers 02/05/2021,10:43 AM

## 2021-02-06 LAB — CBC
HCT: 24.1 % — ABNORMAL LOW (ref 36.0–46.0)
Hemoglobin: 7.9 g/dL — ABNORMAL LOW (ref 12.0–15.0)
MCH: 31.3 pg (ref 26.0–34.0)
MCHC: 32.8 g/dL (ref 30.0–36.0)
MCV: 95.6 fL (ref 80.0–100.0)
Platelets: 108 10*3/uL — ABNORMAL LOW (ref 150–400)
RBC: 2.52 MIL/uL — ABNORMAL LOW (ref 3.87–5.11)
RDW: 16.6 % — ABNORMAL HIGH (ref 11.5–15.5)
WBC: 5.1 10*3/uL (ref 4.0–10.5)
nRBC: 0 % (ref 0.0–0.2)

## 2021-02-06 LAB — COMPREHENSIVE METABOLIC PANEL
ALT: 51 U/L — ABNORMAL HIGH (ref 0–44)
AST: 31 U/L (ref 15–41)
Albumin: 2.2 g/dL — ABNORMAL LOW (ref 3.5–5.0)
Alkaline Phosphatase: 74 U/L (ref 38–126)
Anion gap: 9 (ref 5–15)
BUN: 21 mg/dL — ABNORMAL HIGH (ref 6–20)
CO2: 26 mmol/L (ref 22–32)
Calcium: 8.7 mg/dL — ABNORMAL LOW (ref 8.9–10.3)
Chloride: 101 mmol/L (ref 98–111)
Creatinine, Ser: 1.17 mg/dL — ABNORMAL HIGH (ref 0.44–1.00)
GFR, Estimated: >60 mL/min (ref >60)
Glucose, Bld: 99 mg/dL (ref 70–99)
Potassium: 3.8 mmol/L (ref 3.5–5.1)
Sodium: 136 mmol/L (ref 135–145)
Total Bilirubin: 0.7 mg/dL (ref 0.3–1.2)
Total Protein: 4.5 g/dL — ABNORMAL LOW (ref 6.5–8.1)

## 2021-02-06 LAB — HEPATIC FUNCTION PANEL
ALT: 50 U/L — ABNORMAL HIGH (ref 0–44)
AST: 30 U/L (ref 15–41)
Albumin: 2.1 g/dL — ABNORMAL LOW (ref 3.5–5.0)
Alkaline Phosphatase: 76 U/L (ref 38–126)
Bilirubin, Direct: 0.2 mg/dL (ref 0.0–0.2)
Indirect Bilirubin: 0.4 mg/dL (ref 0.3–0.9)
Total Bilirubin: 0.6 mg/dL (ref 0.3–1.2)
Total Protein: 4.4 g/dL — ABNORMAL LOW (ref 6.5–8.1)

## 2021-02-06 LAB — GLUCOSE, CAPILLARY: Glucose-Capillary: 74 mg/dL (ref 70–99)

## 2021-02-06 LAB — HEPARIN LEVEL (UNFRACTIONATED): Heparin Unfractionated: 0.35 IU/mL (ref 0.30–0.70)

## 2021-02-06 MED ORDER — HYDROCORTISONE SOD SUC (PF) 100 MG IJ SOLR: 15.0000 mg | INTRAMUSCULAR | Status: DC

## 2021-02-06 MED ORDER — HYDROCORTISONE 20 MG PO TABS
25.0000 mg | ORAL_TABLET | ORAL | Status: AC
Start: 2021-02-07 — End: 2021-02-16
  Administered 2021-02-07 – 2021-02-16 (×10): 25 mg via ORAL
  Filled 2021-02-06 (×11): qty 1

## 2021-02-06 MED ORDER — HYDROCORTISONE 5 MG PO TABS
15.0000 mg | ORAL_TABLET | ORAL | Status: AC
  Administered 2021-02-07 – 2021-02-16 (×10): 15 mg via ORAL
  Filled 2021-02-06 (×12): qty 1

## 2021-02-06 MED ORDER — HYDROCORTISONE SOD SUC (PF) 100 MG IJ SOLR: 25.0000 mg | INTRAMUSCULAR | Status: DC

## 2021-02-06 NOTE — Care Management (Signed)
CM spoke to RN for patient with updates. No needs at this time. CM will continue to follow patient.   Gretchen Short RNC-MNN, BSN Transitions of Care [facility]

## 2021-02-06 NOTE — Progress Notes (Signed)
PROGRESS NOTE  Ashley Camacho PQD:[id number] DOB: May 04, 1991 DOA: 12/16/2020 PCP: Hildred Laser, MD  HPI/Recap of past 24 hours: Ashley Camacho is a 30 y.o. G2P0101 with a history of T1DM, chronic HFrEF who was admitted initially 6/30 to [facility] for nausea and vomiting subsequently transferred to [facility] for rhinovirus pneumonia, discharged 7/20 only to return 7/22 with starvation ketosis and brachial vein DVT. TPN thru PICC was started and ultimately the patient was transferred to [facility] for cortrack placement on 7/26. Further complications detailed below:  7/27 cardiology consulted for cardiomyopathy, PICC line inserted, tube feeding started. 7/29 having recurrent retention, Foley catheter inserted 7/30 PRBC transfused 8/5 PRBC transfused 8/15 UCX pansensitive Enterococcus faecalis. 8/16 cardiology signed off, no follow-up recommended 8/19 nephrology consulted, no follow-up recommended 8/20 PRBC transfused 8/21 rescue course of betamethasone 8/22 Foley catheter reinserted after removal due to retention. 8/23 GI consulted. MFM consulted, nephrology signed off. 8/24 underwent EGD shows severe esophagitis with mild oozing.   Dobbhoff removed patient on oral diet. 8/25 PICC line removed. 8/26 started on steroids for adrenal insufficiency. 8/27 2 episodes of hematemesis.  Started on PPI drip.  GI reconsulted.  Lovenox held. 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 8/30 PICC line reinserted due to poor IV access, difficulty with blood draws and need for IV heparin. 9/9 Hematuria, IV Lasix stopped and started on p.o. amoxicillin 500 mg twice daily due to concern for hemorrhagic cystitis.  Augmentin was stopped on 02/01/21  9/13: Still with hematuria.  Restarted Iv lasix 20 mg daily x 3 days, Kcl po 20 meq x 3 days.  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 Orvan Falconer.  IV Protonix switched to oral on 01/28/2021.  Continue Pepcid, Protonix and Carafate No plan to repeat endoscopic evaluation at this time.  Gross hematuria, unclear etiology Initial concern for hemorrhagic cystitis but repeat urine cx was negative.  Augmentin started on 9/9 was Dced on 9/11 by obgyn Renal ultrasound obtained as discussed below.  Recommend consulting urology for their opinion.  I personally saw the urine today.  It was dark but I do not think it had any hematuria.  Patient's hemoglobin has remained solid stable since last 6 days which once again supports the fact that she is not having any hematuria.  Nonetheless, patient told me that she has been told by her OB physician that they have discussed her case with urology and since she is pregnant, they cannot do any cystoscopy at this point in time anyways.  We will continue to monitor her hemoglobin.  Acute urinary retention/bilateral hydronephrosis Retention improved.  Voiding spontaneously.  Renal ultrasound showed right kidney moderate hydronephrosis, slightly improved compared to recent prior examination, left kidney mild hydronephrosis which is new.  Might need to repeat ultrasound in a week or  so.  Nausea and vomiting/hyperemesis/gastroparesis in the setting of pregnancy Management per OB. Decreased insulin rate on 9/11 to avoid hypoglycemia with poor oral intake.  According to patient, she has no more nausea and her oral intake is improved and she is consuming about 100% of her lunch and dinner.  Remains on insulin drip. Closely monitor for hypoglycemia  Elevated liver chemistries, downtrending Normal T bilirubin.  Does not appear to be hemolyzing. Continue to closely monitor intermittently.  Acute blood loss anemia in the setting of hematuria Hemoglobin is stable between 7 and 8.  Transfuse if drops less than 7.  Recurrent Hypoglycemia, due to poor oral intake, nausea with vomiting. Continue insulin drip and D10. Continue to encourage increase in oral intake. Antiemetics as needed  Worsening Anasarca due to severe malnutrition related to hyperemesis as above as well as acute on chronic HFrEF, HTN:  Updated echo showed improved LVEF 45% now up to 50-55%. RV and pulmonary artery pressures wnl. Small pericardial effusion noted without tamponade. Off Lasix 01/30/21, restarted IV lasix 20 mg daily.  Chronic insomnia She is currently on Unisom 12.5 mg twice daily  Acute thrombocytopenia Stable around 110 area.  Monitor closely while patient is on heparin drip.  Adrenal insufficiency: ACTH stim test diagnostic.  Now that she is taking p.o.  We will switch from IV cortisone to oral Cortef.  We will divide the dose at 25 mg in the morning and 15 mg at 5 PM.  Will need to stress dose around her C-section at 100 mg IV every 8 hours for 1 day followed by 50 mg every 8 hours for 1 day followed by 50 mg twice daily for 1 day and then back to her current dose.   Right brachial vein DVT: Dx 12/07/20.  Seems to be recannulating based on repeat U/S.  -Continue therapeutic anticoagulation with heparin drip.   T1DM with gastroparesis and peripheral neuropathy: Brittle control due to  sensitivity and inconsistent calorie inputs. HbA1c was 5.8% 11/21/2020 (impacted by mid-late pregnancy).  She is on scheduled IV Reglan We will keep on insulin drip and D10 W as she is approaching her delivery date. Appreciate diabetes coordinator's assistance. Avoid hypoglycemia.  Hypokalemia: Resolved.  Hypomagnesemia: Resolved   HTN: Fairly controlled, continue to closely monitor. Continue p.o. hydralazine 75mg  q8h.  Continue to monitor for preeclampsia.    Resolved nonoliguric AKI, prerenal in the setting of dehydration from poor oral intake, intake is now improved:  Creatinine 1.05 from 1.11 from 1.16.  GFR greater than 60. Avoid nephrotoxic agents Monitor back on diuretic Continue to monitor urine output  IUP:  - MFM recommends rLTCS at 34 weeks (9/20). Continue NSTs/BPP/serial growth scans. Leaving orders in this regard to MFM/OB.  NST on 9/8 was reactive, reassuring.  - Has received BMZ - Procardia given 9/5 and on 01/29/2021 for contractions.  - OB co-managing, appreciate OB/GYN's assistance.   Treated Enterococcus faecalis UTI: Urine culture collected from 01/05/2021, pansensitive, treated with amoxicillin 8/16 - 8/22.   Limited venous access:  - PICC reinserted 8/30.  Patient has remained fairly stable medically, not much changes are needed.  Hospital service will continue to follow however will see patient in person every other day.   DVT prophylaxis: Heparin gtt Code Status: Full Family Communication: None at  bedside Disposition Plan:  Status is: Inpatient   Remains inpatient appropriate because:IV treatments appropriate due to intensity of illness or inability to take PO   Dispo: The patient is from: Home              Anticipated d/c is to: Home with OB/GYN signs off.              Patient currently is not medically stable to d/c.   Consultants:  OB/GYN Nephrology Cardiology GI  Subjective: Patient seen and examined.  No complaints.  Objective: Vitals:    02/06/21 0500 02/06/21 0803 02/06/21 1155 02/06/21 1348  BP: 140/62 (!) 141/58 (!) 143/62 (!) 143/62  Pulse: 86 90 87 93  Resp: 16 16 16    Temp: 98 F (36.7 C) 98.8 F (37.1 C) 98 F (36.7 C)   TempSrc: Oral Oral Oral   SpO2: 98% 98% 97% 98%  Weight:      Height:        Intake/Output Summary (Last 24 hours) at 02/06/2021 1443 Last data filed at 02/06/2021 1405 Gross per 24 hour  Intake 1211.81 ml  Output 1100 ml  Net 111.81 ml    Filed Weights   01/20/21 0605 01/24/21 0603 01/26/21 0530  Weight: 72 kg 81.7 kg 80.6 kg    Exam: General exam: Appears calm and comfortable  Respiratory system: Clear to auscultation. Respiratory effort normal. Cardiovascular system: S1 & S2 heard, RRR. No JVD, murmurs, rubs, gallops or clicks. No pedal edema. Gastrointestinal system: Abdomen is nondistended, soft and nontender. No organomegaly or masses felt. Normal bowel sounds heard. Central nervous system: Alert and oriented. No focal neurological deficits. Extremities: Symmetric 5 x 5 power. Skin: No rashes, lesions or ulcers.     Data Reviewed: CBC: Recent Labs  Lab 02/02/21 0458 02/03/21 0550 02/04/21 0530 02/05/21 0512 02/06/21 0423  WBC 7.3 6.5 5.6 5.4 5.1  HGB 8.2* 7.9* 7.4* 7.8* 7.9*  HCT 25.0* 23.4* 22.1* 24.9* 24.1*  MCV 94.3 92.5 93.2 96.9 95.6  PLT 104* 110* 106* 109* 108*    Basic Metabolic Panel: Recent Labs  Lab 01/31/21 0612 02/01/21 0513 02/02/21 0458 02/03/21 0550 02/04/21 0530 02/05/21 0512 02/06/21 0423  NA  --    < > 135 134* 136 138 136  K 3.9   < > 3.7 3.4* 3.8 3.9 3.8  CL  --    < > 100 101 102 104 101  CO2  --    < > 24 26 26 27 26   GLUCOSE  --    < > 117* 219* 77 115* 99  BUN  --    < > 20 18 17 19  21*  CREATININE  --    < > 1.11* 1.05* 1.11* 1.13* 1.17*  CALCIUM  --    < > 8.4* 8.0* 8.1* 8.3* 8.7*  MG 1.8  --  2.0  --  1.9  --   --    < > = values in this interval not displayed.    GFR: Estimated Creatinine Clearance: 68.2 mL/min (A)  (by C-G formula based on SCr of 1.17 mg/dL (H)). Liver Function Tests: Recent Labs  Lab 02/03/21 0550 02/04/21 0530 02/05/21 0512 02/06/21 0423 02/06/21 0931  AST 49* 41 36 31 30  ALT 70* 61* 58* 51* 50*  ALKPHOS 70 72 83 74 76  BILITOT 0.7 0.7 0.5 0.7 0.6  PROT 4.2* 4.2* 4.6* 4.5* 4.4*  ALBUMIN 2.1* 2.1* 2.2* 2.2* 2.1*    No results for  input(s): LIPASE, AMYLASE in the last 168 hours. No results for input(s): AMMONIA in the last 168 hours. Coagulation Profile: No results for input(s): INR, PROTIME in the last 168 hours. Cardiac Enzymes: No results for input(s): CKTOTAL, CKMB, CKMBINDEX, TROPONINI in the last 168 hours. BNP (last 3 results) No results for input(s): PROBNP in the last 8760 hours. HbA1C: No results for input(s): HGBA1C in the last 72 hours. CBG: Recent Labs  Lab 02/03/21 0540 02/03/21 1600 02/04/21 0537 02/05/21 0649 02/06/21 0501  GLUCAP 74 112* 76 112* 74    Lipid Profile: No results for input(s): CHOL, HDL, LDLCALC, TRIG, CHOLHDL, LDLDIRECT in the last 72 hours. Thyroid Function Tests: No results for input(s): TSH, T4TOTAL, FREET4, T3FREE, THYROIDAB in the last 72 hours. Anemia Panel: No results for input(s): VITAMINB12, FOLATE, FERRITIN, TIBC, IRON, RETICCTPCT in the last 72 hours.  Urine analysis:    Component Value Date/Time   COLORURINE AMBER (A) 01/10/2021 0131   APPEARANCEUR CLOUDY (A) 01/10/2021 0131   APPEARANCEUR Clear 09/18/2020 1153   LABSPEC 1.017 01/10/2021 0131   LABSPEC 1.026 03/01/2013 1844   PHURINE 5.0 01/10/2021 0131   GLUCOSEU NEGATIVE 01/10/2021 0131   GLUCOSEU >=500 03/01/2013 1844   HGBUR NEGATIVE 01/10/2021 0131   BILIRUBINUR NEGATIVE 01/10/2021 0131   BILIRUBINUR neg 10/23/2020 0953   BILIRUBINUR Negative 09/18/2020 1153   BILIRUBINUR Negative 03/01/2013 1844   KETONESUR NEGATIVE 01/10/2021 0131   PROTEINUR NEGATIVE 01/10/2021 0131   UROBILINOGEN 0.2 10/23/2020 0953   NITRITE NEGATIVE 01/10/2021 0131    LEUKOCYTESUR SMALL (A) 01/10/2021 0131   LEUKOCYTESUR Negative 03/01/2013 1844   Sepsis Labs: @LABRCNTIP (procalcitonin:4,lacticidven:4)  ) Recent Results (from the past 240 hour(s))  OB Urine Culture     Status: Abnormal   Collection Time: 01/28/21  2:37 PM   Specimen: OB Clean Catch; Urine  Result Value Ref Range Status   Specimen Description OB CLEAN CATCH  Final   Special Requests NONE  Final   Culture (A)  Final    MULTIPLE SPECIES PRESENT, SUGGEST RECOLLECTION NO GROUP B STREP (S.AGALACTIAE) ISOLATED Performed at [facility], [address] [place], [place]    Report Status 01/29/2021 FINAL  Final  Urine Culture     Status: None   Collection Time: 01/30/21  9:21 AM   Specimen: Urine, Catheterized  Result Value Ref Range Status   Specimen Description URINE, CATHETERIZED  Final   Special Requests NONE  Final   Culture   Final    NO GROWTH Performed at [facility], [address] [place], [place]    Report Status 01/31/2021 FINAL  Final      Studies: No results found.  Scheduled Meds:  sodium chloride   Intravenous Once   Chlorhexidine Gluconate Cloth  6 each Topical Daily   cholecalciferol  1,000 Units Oral Daily   vitamin B-6  12.5 mg Oral BID   And   doxylamine (Sleep)  12.5 mg Oral BID   DULoxetine  20 mg Oral Daily   famotidine  20 mg Oral BID   feeding supplement (GLUCERNA SHAKE)  237 mL Oral BID BM   hydrALAZINE  75 mg Oral Q8H   [START ON 02/07/2021] hydrocortisone  25 mg Oral Q24H   And   hydrocortisone  15 mg Oral Q24H   mouth rinse  15 mL Mouth Rinse q12n4p   metoCLOPramide (REGLAN) injection  5 mg Intravenous Q6H   pantoprazole  40 mg Oral Daily   prenatal  vitamin w/FE, FA  1 tablet Oral Q1200   sodium chloride  1 spray Each Nare Q2H   sodium chloride flush  10-40 mL Intracatheter Q12H   sucralfate  1 g Oral TID WC & HS   terbutaline  0.25 mg Subcutaneous Once   vitamin B-12  1,000 mcg Oral Daily     Continuous Infusions:  dextrose 25 mL/hr at 02/06/21 1405   heparin 1,300 Units/hr (02/06/21 1405)   insulin 0.7 Units/hr (02/06/21 1350)     LOS: 52 days     Hughie Closs, MD [facility]  If 7PM-7AM, please contact night-coverage [email or web address] 02/06/2021, 2:43 PM

## 2021-02-06 NOTE — Progress Notes (Signed)
Patient ID: Ashley Camacho, female   DOB: 11/21/1990, 30 y.o.   MRN: [id number] [facility] ANTEPARTUM COMPREHENSIVE PROGRESS NOTE  Ashley Camacho is a 30 y.o. G2P0101 at [redacted]w[redacted]d  who is admitted for management of medical problems related to T1 DM in setting of IUP 30 4/7 weeks.   Fetal presentation is cephalic. Length of Stay:  52  Days  Subjective: Pt without new OB complaints today Patient reports good fetal movement.  She reports occ uterine contractions, no bleeding and no loss of fluid per vagina.  Vitals:  Blood pressure (!) 141/58, pulse 90, temperature 98.8 F (37.1 C), temperature source Oral, resp. rate 16, height 5\' 1"  (1.549 m), weight 80.6 kg, SpO2 98 %. Physical Examination: Lungs clear Heart RRR Abd soft + BS gravid Ext 1-2 + edema    Fetal Monitoring:  Baseline: 140's bpm, Variability: Good {> 6 bpm), Accelerations: Reactive, and Decelerations: Absent  Labs:  Results for orders placed or performed during the hospital encounter of 12/16/20 (from the past 24 hour(s))  Heparin level (unfractionated)   Collection Time: 02/06/21  4:23 AM  Result Value Ref Range   Heparin Unfractionated 0.35 0.30 - 0.70 IU/mL  CBC   Collection Time: 02/06/21  4:23 AM  Result Value Ref Range   WBC 5.1 4.0 - 10.5 K/uL   RBC 2.52 (L) 3.87 - 5.11 MIL/uL   Hemoglobin 7.9 (L) 12.0 - 15.0 g/dL   HCT 02/08/21 (L) 49.4 - 49.6 %   MCV 95.6 80.0 - 100.0 fL   MCH 31.3 26.0 - 34.0 pg   MCHC 32.8 30.0 - 36.0 g/dL   RDW 75.9 (H) 16.3 - 84.6 %   Platelets 108 (L) 150 - 400 K/uL   nRBC 0.0 0.0 - 0.2 %  Comprehensive metabolic panel   Collection Time: 02/06/21  4:23 AM  Result Value Ref Range   Sodium 136 135 - 145 mmol/L   Potassium 3.8 3.5 - 5.1 mmol/L   Chloride 101 98 - 111 mmol/L   CO2 26 22 - 32 mmol/L   Glucose, Bld 99 70 - 99 mg/dL   BUN 21 (H) 6 - 20 mg/dL   Creatinine, Ser 02/08/21 (H) 0.44 - 1.00 mg/dL   Calcium 8.7 (L) 8.9 - 10.3 mg/dL   Total Protein 4.5 (L) 6.5 - 8.1 g/dL    Albumin 2.2 (L) 3.5 - 5.0 g/dL   AST 31 15 - 41 U/L   ALT 51 (H) 0 - 44 U/L   Alkaline Phosphatase 74 38 - 126 U/L   Total Bilirubin 0.7 0.3 - 1.2 mg/dL   GFR, Estimated 9.35 >70 mL/min   Anion gap 9 5 - 15  Glucose, capillary   Collection Time: 02/06/21  5:01 AM  Result Value Ref Range   Glucose-Capillary 74 70 - 99 mg/dL  Hepatic function panel   Collection Time: 02/06/21  9:31 AM  Result Value Ref Range   Total Protein 4.4 (L) 6.5 - 8.1 g/dL   Albumin 2.1 (L) 3.5 - 5.0 g/dL   AST 30 15 - 41 U/L   ALT 50 (H) 0 - 44 U/L   Alkaline Phosphatase 76 38 - 126 U/L   Total Bilirubin 0.6 0.3 - 1.2 mg/dL   Bilirubin, Direct 0.2 0.0 - 0.2 mg/dL   Indirect Bilirubin 0.4 0.3 - 0.9 mg/dL    Imaging Studies:    NA   Medications:  Scheduled  sodium chloride   Intravenous Once   Chlorhexidine Gluconate Cloth  6 each Topical Daily   cholecalciferol  1,000 Units Oral Daily   vitamin B-6  12.5 mg Oral BID   And   doxylamine (Sleep)  12.5 mg Oral BID   DULoxetine  20 mg Oral Daily   famotidine  20 mg Oral BID   feeding supplement (GLUCERNA SHAKE)  237 mL Oral BID BM   hydrALAZINE  75 mg Oral Q8H   hydrocortisone sod succinate (SOLU-CORTEF) inj  40 mg Intravenous Daily   mouth rinse  15 mL Mouth Rinse q12n4p   metoCLOPramide (REGLAN) injection  5 mg Intravenous Q6H   pantoprazole  40 mg Oral Daily   prenatal vitamin w/FE, FA  1 tablet Oral Q1200   sodium chloride  1 spray Each Nare Q2H   sodium chloride flush  10-40 mL Intracatheter Q12H   sucralfate  1 g Oral TID WC & HS   terbutaline  0.25 mg Subcutaneous Once   vitamin B-12  1,000 mcg Oral Daily   I have reviewed the patient's current medications.  ASSESSMENT: IUP 32 3/7 weeks Multiple medical problems as outlined in the chart.  PLAN: Stable form an OB stable point. Fetal well being reassuring. Delivery at 34 weeks via c section with BTL. Management of medical problems problems as per medicine team Continue routine antenatal  care.   Hermina Staggers 02/06/2021,11:57 AM

## 2021-02-06 NOTE — Progress Notes (Signed)
ANTICOAGULATION CONSULT NOTE - Follow Up Consult  Pharmacy Consult for heparin Indication:  right brachial VTE in pregnancy  No Known Allergies  Patient Measurements: Height: 5\' 1"  (154.9 cm) Weight: 80.6 kg (177 lb 9.6 oz) IBW/kg (Calculated) : 47.8 Heparin Dosing Weight: 63.4kg  Vital Signs: Temp: 98 F (36.7 C) (09/16 1155) Temp Source: Oral (09/16 1155) BP: 143/62 (09/16 1348) Pulse Rate: 93 (09/16 1348)  Labs: Recent Labs    02/04/21 0530 02/04/21 0835 02/05/21 0512 02/06/21 0423  HGB 7.4*  --  7.8* 7.9*  HCT 22.1*  --  24.9* 24.1*  PLT 106*  --  109* 108*  HEPARINUNFRC >1.10* 0.55 0.44 0.35  CREATININE 1.11*  --  1.13* 1.17*    Estimated Creatinine Clearance: 68.2 mL/min (A) (by C-G formula based on SCr of 1.17 mg/dL (H)).   Medications:  Scheduled:   sodium chloride   Intravenous Once   Chlorhexidine Gluconate Cloth  6 each Topical Daily   cholecalciferol  1,000 Units Oral Daily   vitamin B-6  12.5 mg Oral BID   And   doxylamine (Sleep)  12.5 mg Oral BID   DULoxetine  20 mg Oral Daily   famotidine  20 mg Oral BID   feeding supplement (GLUCERNA SHAKE)  237 mL Oral BID BM   hydrALAZINE  75 mg Oral Q8H   [START ON 02/07/2021] hydrocortisone  25 mg Oral Q24H   And   [START ON 02/07/2021] hydrocortisone  15 mg Oral Q24H   mouth rinse  15 mL Mouth Rinse q12n4p   metoCLOPramide (REGLAN) injection  5 mg Intravenous Q6H   pantoprazole  40 mg Oral Daily   prenatal vitamin w/FE, FA  1 tablet Oral Q1200   sodium chloride  1 spray Each Nare Q2H   sodium chloride flush  10-40 mL Intracatheter Q12H   sucralfate  1 g Oral TID WC & HS   terbutaline  0.25 mg Subcutaneous Once   vitamin B-12  1,000 mcg Oral Daily   Infusions:   dextrose 25 mL/hr at 02/06/21 1405   heparin 1,300 Units/hr (02/06/21 1405)   insulin 0.7 Units/hr (02/06/21 1350)    Assessment: Ashley Camacho at [redacted]w[redacted]d gestation on heparin drip at 1300 units/hr for right brachial VTE. Platelets today are low  but stable at 108. Will continue to follow. Heparin level this AM at 0423 is therapeutic at 0.35 units/mL.   Goal of Therapy:  Heparin level 0.3-0.7 units/ml Monitor platelets by anticoagulation protocol: Yes   Plan:  Given steady decline in heparin level, to avoid becoming subtherapeutic, will increase heparin drip slightly to 1350 units/hr. Will continue to monitor daily heparin levels and adjust accordingly. Continue to monitor H&H and platelets  [redacted]w[redacted]d 02/06/2021,3:40 PM

## 2021-02-07 DIAGNOSIS — O0990 Supervision of high risk pregnancy, unspecified, unspecified trimester: Secondary | ICD-10-CM

## 2021-02-07 DIAGNOSIS — L7632 Postprocedural hematoma of skin and subcutaneous tissue following other procedure: Secondary | ICD-10-CM

## 2021-02-07 LAB — TYPE AND SCREEN
ABO/RH(D): A POS
Antibody Screen: NEGATIVE

## 2021-02-07 LAB — GLUCOSE, CAPILLARY: Glucose-Capillary: 73 mg/dL (ref 70–99)

## 2021-02-07 LAB — HEPARIN LEVEL (UNFRACTIONATED): Heparin Unfractionated: 0.47 IU/mL (ref 0.30–0.70)

## 2021-02-07 MED ORDER — GLUCERNA SHAKE PO LIQD
237.0000 mL | Freq: Two times a day (BID) | ORAL | Status: DC
Start: 2021-02-07 — End: 2021-02-26
  Administered 2021-02-08 – 2021-02-26 (×26): 237 mL via ORAL
  Filled 2021-02-07 (×40): qty 237

## 2021-02-07 MED ORDER — FUROSEMIDE 10 MG/ML IJ SOLN
40.0000 mg | Freq: Two times a day (BID) | INTRAMUSCULAR | Status: AC
  Administered 2021-02-07 – 2021-02-09 (×6): 40 mg via INTRAVENOUS
  Filled 2021-02-07 (×6): qty 4

## 2021-02-07 NOTE — Progress Notes (Signed)
PROGRESS NOTE  Ashley Camacho SFK:[id number] DOB: 1990-12-29 DOA: 12/16/2020 PCP: Hildred Laser, MD  HPI/Recap of past 24 hours: Ashley Camacho is a 30 y.o. G2P0101 with a history of T1DM, chronic HFrEF who was admitted initially 6/30 to [facility] for nausea and vomiting subsequently transferred to [facility] for rhinovirus pneumonia, discharged 7/20 only to return 7/22 with starvation ketosis and brachial vein DVT. TPN thru PICC was started and ultimately the patient was transferred to [facility] for cortrack placement on 7/26. Further complications detailed below:  7/27 cardiology consulted for cardiomyopathy, PICC line inserted, tube feeding started. 7/29 having recurrent retention, Foley catheter inserted 7/30 PRBC transfused 8/5 PRBC transfused 8/15 UCX pansensitive Enterococcus faecalis. 8/16 cardiology signed off, no follow-up recommended 8/19 nephrology consulted, no follow-up recommended 8/20 PRBC transfused 8/21 rescue course of betamethasone 8/22 Foley catheter reinserted after removal due to retention. 8/23 GI consulted. MFM consulted, nephrology signed off. 8/24 underwent EGD shows severe esophagitis with mild oozing.   Dobbhoff removed patient on oral diet. 8/25 PICC line removed. 8/26 started on steroids for adrenal insufficiency. 8/27 2 episodes of hematemesis.  Started on PPI drip.  GI reconsulted.  Lovenox held. 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 8/30 PICC line reinserted due to poor IV access, difficulty with blood draws and need for IV heparin. 9/9 Hematuria, IV Lasix stopped and started on p.o. amoxicillin 500 mg twice daily due to concern for hemorrhagic cystitis.  Augmentin was stopped on 02/01/21  9/13: Still with hematuria.  Restarted Iv lasix 20 mg daily x 3 days, Kcl po 20 meq x 3 days.  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 Orvan Falconer.  IV Protonix switched to oral on 01/28/2021.  Continue Pepcid, Protonix and Carafate No plan to repeat endoscopic evaluation at this time.  Gross hematuria, unclear etiology Initial concern for hemorrhagic cystitis but repeat urine cx was negative.  Augmentin started on 9/9 was Dced on 9/11 by obgyn Renal ultrasound obtained as discussed below.  Recommend consulting urology for their opinion.  I personally saw the urine today.  It was dark but I do not think it had any hematuria.  Patient's hemoglobin has remained solid stable since last 6 days which once again supports the fact that she is not having any hematuria.  Nonetheless, patient told me that she has been told by her OB physician that they have discussed her case with urology and since she is pregnant, they cannot do any cystoscopy at this point in time anyways.  We will continue to monitor her hemoglobin.  Acute urinary retention/bilateral hydronephrosis Retention improved.  Voiding spontaneously.  Renal ultrasound showed right kidney  moderate hydronephrosis, slightly improved compared to recent prior examination, left kidney mild hydronephrosis which is new.  We will repeat ultrasound on 02/12/2021.  Nausea and vomiting/hyperemesis/gastroparesis in the  setting of pregnancy Management per OB. Decreased insulin rate on 9/11 to avoid hypoglycemia with poor oral intake.  According to patient, she has no more nausea and her oral intake is improved and she is consuming about 100% of her lunch and dinner.  Remains on insulin drip. Closely monitor for hypoglycemia  Elevated liver chemistries, downtrending Normal T bilirubin.  Does not appear to be hemolyzing. Continue to closely monitor intermittently.  Acute blood loss anemia in the setting of hematuria Hemoglobin is stable between 7 and 8.  Transfuse if drops less than 7.  Recurrent Hypoglycemia, due to poor oral intake, nausea with vomiting. Continue insulin drip and D10. Continue to encourage increase in oral intake. Antiemetics as needed  Worsening Anasarca due to severe malnutrition related to hyperemesis as above as well as acute on chronic HFrEF, HTN:  Updated echo showed improved LVEF 45% now up to 50-55%. RV and pulmonary artery pressures wnl. Small pericardial effusion noted without tamponade. Off Lasix since yesterday.  Patient tells me that her edema has worsened and she is requesting to be placed back on Lasix.  On exam, she does have +3 pitting edema all the way up to the waist.  We will place her back on 40 mg IV Lasix twice daily for 6 doses.  Reassess on Tuesday.  Monitor renal function daily  Chronic insomnia She is currently on Unisom 12.5 mg twice daily  Acute thrombocytopenia Stable around 110 area.  Monitor closely while patient is on heparin drip.  Adrenal insufficiency: ACTH stim test diagnostic.  Continue Cortef 25 mg in the morning and 15 mg at 5 PM.  Will need to stress dose around her C-section at 100 mg IV every 8 hours for 1 day followed by 50 mg every 8 hours for 1 day followed by 50 mg twice daily for 1 day and then back to her current dose.   Right brachial vein DVT: Dx 12/07/20.  Seems to be recannulating based on repeat U/S.  -Continue therapeutic  anticoagulation with heparin drip.   T1DM with gastroparesis and peripheral neuropathy: Brittle control due to sensitivity and inconsistent calorie inputs. HbA1c was 5.8% 11/21/2020 (impacted by mid-late pregnancy).  She is on scheduled IV Reglan We will keep on insulin drip and D10 W as she is approaching her delivery date. Appreciate diabetes coordinator's assistance. Avoid hypoglycemia.  Hypokalemia: Resolved.  Hypomagnesemia: Resolved   HTN: Fairly controlled, continue to closely monitor. Continue p.o. hydralazine 75mg  q8h.  Continue to monitor for preeclampsia.    Resolved nonoliguric AKI, prerenal in the setting of dehydration from poor oral intake, intake is now improved:  Creatinine 1.05 from 1.11 from 1.16.  GFR greater than 60. Avoid nephrotoxic agents Monitor back on diuretic Continue to monitor urine output  IUP:  - MFM recommends rLTCS at 34 weeks (9/20). Continue NSTs/BPP/serial growth scans. Leaving orders in this regard to MFM/OB.  NST on 9/8 was reactive, reassuring.  - Has received BMZ - Procardia given 9/5 and on 01/29/2021 for contractions.  - OB co-managing, appreciate OB/GYN's assistance.   Treated Enterococcus faecalis UTI: Urine culture collected from 01/05/2021, pansensitive, treated with amoxicillin 8/16 - 8/22.   Limited venous access:  - PICC reinserted 8/30.  Patient has remained fairly stable medically, not much changes are needed.  Hospitalist service will continue to follow  however will see patient in person every other day.   DVT prophylaxis: Heparin gtt Code Status: Full Family Communication: None at bedside Disposition Plan:  Status is: Inpatient   Remains inpatient appropriate because:IV treatments appropriate due to intensity of illness or inability to take PO   Dispo: The patient is from: Home              Anticipated d/c is to: Home with OB/GYN signs off.              Patient currently is not medically stable to d/c.   Consultants:   OB/GYN Nephrology Cardiology GI  Subjective: Seen and examined.  She is concerned about developing edema.  No other complaint.  Objective: Vitals:   02/06/21 2349 02/07/21 0357 02/07/21 0742 02/07/21 1132  BP: (!) 139/58 (!) 139/59 (!) 153/65 (!) 147/66  Pulse: 87 91 92 89  Resp: 16 17 16 16   Temp: 98.3 F (36.8 C) 98.1 F (36.7 C) 98.1 F (36.7 C) 98.2 F (36.8 C)  TempSrc:  Oral Oral Oral  SpO2: 98% 98% 98% 99%  Weight:      Height:        Intake/Output Summary (Last 24 hours) at 02/07/2021 1228 Last data filed at 02/07/2021 1200 Gross per 24 hour  Intake 1526.23 ml  Output 1400 ml  Net 126.23 ml    Filed Weights   01/20/21 0605 01/24/21 0603 01/26/21 0530  Weight: 72 kg 81.7 kg 80.6 kg    Exam: General exam: Appears calm and comfortable  Respiratory system: Clear to auscultation. Respiratory effort normal. Cardiovascular system: S1 & S2 heard, RRR. No JVD, murmurs, rubs, gallops or clicks.  +3 pitting edema bilateral lower extremity. Gastrointestinal system: Abdomen is nondistended, soft and nontender. No organomegaly or masses felt. Normal bowel sounds heard. Central nervous system: Alert and oriented. No focal neurological deficits. Extremities: Symmetric 5 x 5 power. Skin: No rashes, lesions or ulcers.  Psychiatry: Judgement and insight appear normal. Mood & affect appropriate.    Data Reviewed: CBC: Recent Labs  Lab 02/02/21 0458 02/03/21 0550 02/04/21 0530 02/05/21 0512 02/06/21 0423  WBC 7.3 6.5 5.6 5.4 5.1  HGB 8.2* 7.9* 7.4* 7.8* 7.9*  HCT 25.0* 23.4* 22.1* 24.9* 24.1*  MCV 94.3 92.5 93.2 96.9 95.6  PLT 104* 110* 106* 109* 108*    Basic Metabolic Panel: Recent Labs  Lab 02/02/21 0458 02/03/21 0550 02/04/21 0530 02/05/21 0512 02/06/21 0423  NA 135 134* 136 138 136  K 3.7 3.4* 3.8 3.9 3.8  CL 100 101 102 104 101  CO2 24 26 26 27 26   GLUCOSE 117* 219* 77 115* 99  BUN 20 18 17 19  21*  CREATININE 1.11* 1.05* 1.11* 1.13* 1.17*   CALCIUM 8.4* 8.0* 8.1* 8.3* 8.7*  MG 2.0  --  1.9  --   --     GFR: Estimated Creatinine Clearance: 68.2 mL/min (A) (by C-G formula based on SCr of 1.17 mg/dL (H)). Liver Function Tests: Recent Labs  Lab 02/03/21 0550 02/04/21 0530 02/05/21 0512 02/06/21 0423 02/06/21 0931  AST 49* 41 36 31 30  ALT 70* 61* 58* 51* 50*  ALKPHOS 70 72 83 74 76  BILITOT 0.7 0.7 0.5 0.7 0.6  PROT 4.2* 4.2* 4.6* 4.5* 4.4*  ALBUMIN 2.1* 2.1* 2.2* 2.2* 2.1*    No results for input(s): LIPASE, AMYLASE in the last 168 hours. No results for input(s): AMMONIA in the last 168 hours. Coagulation Profile: No results for input(s): INR, PROTIME  in the last 168 hours. Cardiac Enzymes: No results for input(s): CKTOTAL, CKMB, CKMBINDEX, TROPONINI in the last 168 hours. BNP (last 3 results) No results for input(s): PROBNP in the last 8760 hours. HbA1C: No results for input(s): HGBA1C in the last 72 hours. CBG: Recent Labs  Lab 02/03/21 1600 02/04/21 0537 02/05/21 0649 02/06/21 0501 02/07/21 0502  GLUCAP 112* 76 112* 74 73    Lipid Profile: No results for input(s): CHOL, HDL, LDLCALC, TRIG, CHOLHDL, LDLDIRECT in the last 72 hours. Thyroid Function Tests: No results for input(s): TSH, T4TOTAL, FREET4, T3FREE, THYROIDAB in the last 72 hours. Anemia Panel: No results for input(s): VITAMINB12, FOLATE, FERRITIN, TIBC, IRON, RETICCTPCT in the last 72 hours.  Urine analysis:    Component Value Date/Time   COLORURINE AMBER (A) 01/10/2021 0131   APPEARANCEUR CLOUDY (A) 01/10/2021 0131   APPEARANCEUR Clear 09/18/2020 1153   LABSPEC 1.017 01/10/2021 0131   LABSPEC 1.026 03/01/2013 1844   PHURINE 5.0 01/10/2021 0131   GLUCOSEU NEGATIVE 01/10/2021 0131   GLUCOSEU >=500 03/01/2013 1844   HGBUR NEGATIVE 01/10/2021 0131   BILIRUBINUR NEGATIVE 01/10/2021 0131   BILIRUBINUR neg 10/23/2020 0953   BILIRUBINUR Negative 09/18/2020 1153   BILIRUBINUR Negative 03/01/2013 1844   KETONESUR NEGATIVE 01/10/2021  0131   PROTEINUR NEGATIVE 01/10/2021 0131   UROBILINOGEN 0.2 10/23/2020 0953   NITRITE NEGATIVE 01/10/2021 0131   LEUKOCYTESUR SMALL (A) 01/10/2021 0131   LEUKOCYTESUR Negative 03/01/2013 1844   Sepsis Labs: @LABRCNTIP (procalcitonin:4,lacticidven:4)  ) Recent Results (from the past 240 hour(s))  OB Urine Culture     Status: Abnormal   Collection Time: 01/28/21  2:37 PM   Specimen: OB Clean Catch; Urine  Result Value Ref Range Status   Specimen Description OB CLEAN CATCH  Final   Special Requests NONE  Final   Culture (A)  Final    MULTIPLE SPECIES PRESENT, SUGGEST RECOLLECTION NO GROUP B STREP (S.AGALACTIAE) ISOLATED Performed at [facility], [address] [place], [place]    Report Status 01/29/2021 FINAL  Final  Urine Culture     Status: None   Collection Time: 01/30/21  9:21 AM   Specimen: Urine, Catheterized  Result Value Ref Range Status   Specimen Description URINE, CATHETERIZED  Final   Special Requests NONE  Final   Culture   Final    NO GROWTH Performed at [facility], [address], [place], [place]    Report Status 01/31/2021 FINAL  Final      Studies: No results found.  Scheduled Meds:  sodium chloride   Intravenous Once   Chlorhexidine Gluconate Cloth  6 each Topical Daily   cholecalciferol  1,000 Units Oral Daily   vitamin B-6  12.5 mg Oral BID   And   doxylamine (Sleep)  12.5 mg Oral BID   DULoxetine  20 mg Oral Daily   famotidine  20 mg Oral BID   feeding supplement (GLUCERNA SHAKE)  237 mL Oral BID BM   furosemide  40 mg Intravenous BID   hydrALAZINE  75 mg Oral Q8H   hydrocortisone  25 mg Oral Q24H   And   hydrocortisone  15 mg Oral Q24H   mouth rinse  15 mL Mouth Rinse q12n4p   metoCLOPramide (REGLAN) injection  5 mg Intravenous Q6H   pantoprazole  40 mg Oral Daily   prenatal vitamin w/FE, FA  1 tablet Oral Q1200   sodium chloride  1 spray Each Nare Q2H   sodium  chloride flush  10-40 mL Intracatheter  Q12H   sucralfate  1 g Oral TID WC & HS   terbutaline  0.25 mg Subcutaneous Once   vitamin B-12  1,000 mcg Oral Daily    Continuous Infusions:  dextrose 25 mL/hr at 02/06/21 1800   heparin 1,350 Units/hr (02/06/21 2142)   insulin 1.9 Units/hr (02/07/21 1215)     LOS: 53 days     Hughie Closs, MD [facility]  If 7PM-7AM, please contact night-coverage [email or web address] 02/07/2021, 12:28 PM

## 2021-02-07 NOTE — Progress Notes (Signed)
Hypoglycemic Event   CBG: 0457-68 per Freestyle 73 per Finger stick  Treatment: 30 mL D50 per Endotool  Symptoms: None  Follow-up CBG: Time: 05:12 CBG Result:140  Possible Reasons for Event:  Unknown  Comments/MD notified: Dr. Perrin Maltese Dr. Vergie Living- no new orders    Barbaraann Rondo

## 2021-02-07 NOTE — Progress Notes (Signed)
ANTICOAGULATION CONSULT NOTE - Follow Up Consult  Pharmacy Consult for heparin Indication:  right brachial VTE in pregnancy  No Known Allergies  Patient Measurements: Height: 5\' 1"  (154.9 cm) Weight: 80.6 kg (177 lb 9.6 oz) IBW/kg (Calculated) : 47.8 kg Heparin Dosing Weight: 63.4kg  Vital Signs: Temp: 98.1 F (36.7 C) (09/17 0742) Temp Source: Oral (09/17 0742) BP: 153/65 (09/17 0742) Pulse Rate: 92 (09/17 0742)  Labs: Recent Labs    02/05/21 0512 02/06/21 0423 02/07/21 0425  HGB 7.8* 7.9*  --   HCT 24.9* 24.1*  --   PLT 109* 108*  --   HEPARINUNFRC 0.44 0.35 0.47  CREATININE 1.13* 1.17*  --      Estimated Creatinine Clearance: 68.2 mL/min (A) (by C-G formula based on SCr of 1.17 mg/dL (H)).   Medications:  Scheduled:   sodium chloride   Intravenous Once   Chlorhexidine Gluconate Cloth  6 each Topical Daily   cholecalciferol  1,000 Units Oral Daily   vitamin B-6  12.5 mg Oral BID   And   doxylamine (Sleep)  12.5 mg Oral BID   DULoxetine  20 mg Oral Daily   famotidine  20 mg Oral BID   feeding supplement (GLUCERNA SHAKE)  237 mL Oral BID BM   hydrALAZINE  75 mg Oral Q8H   hydrocortisone  25 mg Oral Q24H   And   hydrocortisone  15 mg Oral Q24H   mouth rinse  15 mL Mouth Rinse q12n4p   metoCLOPramide (REGLAN) injection  5 mg Intravenous Q6H   pantoprazole  40 mg Oral Daily   prenatal vitamin w/FE, FA  1 tablet Oral Q1200   sodium chloride  1 spray Each Nare Q2H   sodium chloride flush  10-40 mL Intracatheter Q12H   sucralfate  1 g Oral TID WC & HS   terbutaline  0.25 mg Subcutaneous Once   vitamin B-12  1,000 mcg Oral Daily   Infusions:   dextrose 25 mL/hr at 02/06/21 1800   heparin 1,350 Units/hr (02/06/21 2142)   insulin 1 Units/hr (02/07/21 0817)    Assessment: 29 YOF at [redacted]w[redacted]d gestation on heparin drip at 1350 units/hr for right brachial VTE. CBC not drawn today. CBC has been stable past 4 days.  Will reorder daily CBC.  Heparin level this AM is  therapeutic at 0.47 units/mL.   Goal of Therapy:  Heparin level 0.3-0.7 units/ml Monitor platelets by anticoagulation protocol: Yes   Plan:  Continue heparin drip at 1350 units/hr. Will continue to monitor daily heparin levels and adjust accordingly. Continue to monitor H&H and platelets - Order daily CBC.  [redacted]w[redacted]d 02/07/2021,8:35 AM

## 2021-02-07 NOTE — Progress Notes (Signed)
Daily Antepartum Note  Admission Date: 12/16/2020 Current Date: 02/07/2021   Ashley Camacho is a 30 y.o. G2P0101 @ [redacted]w[redacted]d, admitted for acute on chronic GI s/s.  Pregnancy complicated by: Patient Active Problem List   Diagnosis Date Noted   Pregnancy with 31 completed weeks gestation    Acute esophagitis    Leg edema    History of preterm delivery, currently pregnant    Pre-existing type 1 diabetes mellitus during pregnancy in third trimester    Anemia during pregnancy    Type 1 diabetes mellitus affecting pregnancy in second trimester, antepartum    HFrEF (heart failure with reduced ejection fraction) (HCC)    Acute deep vein thrombosis (DVT) of brachial vein of right upper extremity (HCC) 12/17/2020   Anxiety 12/17/2020   Diabetic retinopathy (HCC) 12/17/2020   Acute on chronic combined systolic and diastolic CHF (congestive heart failure) (HCC)    Hyperemesis 12/13/2020   DKA, type 1, not at goal [facility]) 12/13/2020   Sunburn 12/13/2020   Hypothyroidism 12/10/2020   Malnutrition (HCC) 12/10/2020   Vitreous hemorrhage of right eye (HCC) 12/10/2020   Pleural effusion associated with pulmonary infection 11/25/2020   Pneumonia affecting pregnancy 11/24/2020   Diabetes mellitus affecting pregnancy, second trimester 11/24/2020   Edema 11/24/2020   Decreased urine output 11/24/2020   Shortness of breath    Zinc deficiency    SOB (shortness of breath)    Social problem 11/21/2020   Nausea and vomiting during pregnancy prior to [redacted] weeks gestation 11/21/2020   Low serum vitamin B12    Delivery with history of C-section 11/20/2020   Absolute anemia    Acute kidney injury (HCC)    Nausea and vomiting during pregnancy 10/23/2020   History of premature delivery, currently pregnant, second trimester 10/13/2020   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 No other OB s/s.   Objective:    Current Vital Signs 24h Vital Sign Ranges  T 98.1 F (36.7 C) Temp  Avg: 98.1 F (36.7 C)  Min: 97.8 F (36.6 C)  Max: 98.3 F (36.8 C)  BP (!) 153/65  BP  Min: 139/59  Max: 153/65  HR 92 Pulse  Avg: 91.4  Min: 87  Max: 95  RR 16 Resp  Avg: 16.3  Min: 16  Max: 17  SaO2 98 % Room Air SpO2  Avg: 97.9 %  Min: 97 %  Max: 100 %       24 Hour I/O Current Shift I/O  Time Ins Outs 09/16 0701 - 09/17 0700 In: 1761.4 [P.O.:880; I.V.:881.4] Out: 1450 [Urine:1450] 09/17 0701 - 09/17 1900 In: 240 [P.O.:240] Out: 250 [Urine:250]   Patient Vitals for the past 24 hrs:  BP Temp Temp src Pulse Resp SpO2  02/07/21 0742 (!) 153/65 98.1 F (36.7 C) Oral 92 16 98 %  02/07/21 0357 (!) 139/59 98.1 F (36.7 C) Oral 91 17 98 %  02/06/21 2349 (!) 139/58 98.3 F (36.8 C) -- 87 16 98 %  02/06/21 1955 -- -- -- -- -- 97 %  02/06/21 1953 (!) 146/67 98 F (36.7 C) Oral 95 16 97 %  02/06/21 1602 (!) 148/64 97.8 F (36.6 C) Oral 95 17 100 %  02/06/21 1348 (!) 143/62 -- -- 93 -- 98 %  02/06/21 1155 (!) 143/62 98 F (36.7 C) Oral 87 16 97 %    Fetal Heart Tones: 135 baseline, +accels, no decel, mod variability Tocometry: occasional contractions  Physical exam: General: Well nourished, well developed female in no acute distress. Abdomen: gravid nttp Respiratory: no respiratory distress Extremities: no clubbing, cyanosis or edema Skin: Warm and dry.   Medications: Current Facility-Administered Medications  Medication Dose Route Frequency Provider Last Rate Last Admin   0.9 %  sodium chloride infusion (Manually program via Guardrails IV Fluids)   Intravenous Once Hermina Staggers, MD       acetaminophen (TYLENOL) tablet 650 mg  650 mg Oral Q6H PRN Darlin Drop, DO   650 mg at 02/04/21 1047   Chlorhexidine Gluconate Cloth 2 % PADS 6 each  6 each Topical Daily Rolly Salter, MD   6 each at 02/07/21 1006   cholecalciferol (VITAMIN D3) tablet 1,000 Units  1,000 Units Oral Daily [name], DO   1,000 Units at 02/07/21 1011   dextrose 10 % infusion   Intravenous Continuous Tyrone Nine, MD 25 mL/hr at 02/06/21 1800 Infusion Verify at 02/06/21 1800   dextrose 50 % solution 0-50 mL  0-50 mL Intravenous PRN Alberteen Sam, MD   30 mL at 02/07/21 0501   diphenhydrAMINE-zinc acetate (BENADRYL) 2-0.1 % cream   Topical Daily PRN Anyanwu, Ugonna A, MD       vitamin B-6 (pyridOXINE) tablet 12.5 mg  12.5 mg Oral BID [name], DO   12.5 mg at 02/07/21 1012   And   doxylamine (Sleep) (UNISOM) tablet 12.5 mg  12.5 mg Oral BID [name], DO   12.5 mg at 02/07/21 1012   DULoxetine (CYMBALTA) DR capsule 20 mg  20 mg Oral Daily Ozan, Jennifer, DO   20 mg at 02/07/21 1011   famotidine (PEPCID) tablet 20 mg  20 mg Oral BID Anyanwu, Ugonna A, MD   20 mg at 02/07/21 0918   feeding supplement (GLUCERNA SHAKE) (GLUCERNA SHAKE) liquid 237 mL  237 mL Oral BID BM Rolly Salter, MD   237 mL at 02/06/21 1649   Glycerin (Adult) 2 g suppository 1 suppository  1 suppository Rectal Once PRN Opyd, Lavone Neri, MD       heparin ADULT infusion 100 units/mL (25000 units/252mL)  1,350 Units/hr Intravenous Continuous Hughie Closs, MD 13.5 mL/hr at 02/06/21 2142 1,350 Units/hr at 02/06/21 2142   hydrALAZINE (APRESOLINE) tablet 75 mg  75 mg Oral Q8H Tyrone Nine, MD   75 mg at 02/07/21 0617   hydrocortisone (CORTEF) tablet 25 mg  25 mg Oral Q24H Pahwani, Daleen Bo, MD   25 mg at 02/07/21 1007   And   hydrocortisone (CORTEF) tablet 15 mg  15 mg Oral Q24H Pahwani, Ravi, MD       insulin regular, human (MYXREDLIN) 100 units/ 100 mL infusion   Intravenous Continuous Hall, Carole N, DO 1.5 mL/hr at 02/07/21 1020 1.5 Units/hr at 02/07/21 1020   MEDLINE mouth rinse  15 mL  Mouth Rinse q12n4p Elgergawy, Leana Roe, MD   15 mL at 02/06/21 1547   metoCLOPramide (REGLAN) injection 5 mg  5 mg Intravenous Q6H Warden Fillers, MD   5 mg at 02/07/21 0917   NIFEdipine (PROCARDIA) capsule 10 mg  10 mg Oral Q6H PRN Adam Phenix, MD   10 mg at 02/03/21 1403   ondansetron (ZOFRAN-ODT) disintegrating tablet  4 mg  4 mg Oral Q8H PRN [name], DO   4 mg at 02/01/21 0738   pantoprazole (PROTONIX) EC tablet 40 mg  40 mg Oral Daily Dow Adolph N, DO   40 mg at 02/07/21 4540   prenatal vitamin w/FE, FA (NATACHEW) chewable tablet 1 tablet  1 tablet Oral Q1200 Albertine Grates, MD   1 tablet at 02/07/21 1011   prochlorperazine (COMPAZINE) injection 10 mg  10 mg Intravenous Q6H PRN Warden Fillers, MD   10 mg at 02/02/21 0245   senna (SENOKOT) tablet 8.6 mg  1 tablet Oral Daily PRN Opyd, Lavone Neri, MD       sodium chloride (OCEAN) 0.65 % nasal spray 1 spray  1 spray Each Nare Q2H Rolly Salter, MD   1 spray at 02/07/21 0924   sodium chloride flush (NS) 0.9 % injection 10-40 mL  10-40 mL Intracatheter Q12H Rolly Salter, MD   10 mL at 02/06/21 0945   sodium chloride flush (NS) 0.9 % injection 10-40 mL  10-40 mL Intracatheter PRN Rolly Salter, MD   10 mL at 01/26/21 1509   sucralfate (CARAFATE) 1 GM/10ML suspension 1 g  1 g Oral TID WC & HS Tressia Danas, MD   1 g at 02/07/21 0841   terbutaline (BRETHINE) injection 0.25 mg  0.25 mg Subcutaneous Once Lazaro Arms, MD       vitamin B-12 (CYANOCOBALAMIN) tablet 1,000 mcg  1,000 mcg Oral Daily [name], DO   1,000 mcg at 02/07/21 1008    Labs:  Recent Labs  Lab 02/04/21 0530 02/05/21 0512 02/06/21 0423  WBC 5.6 5.4 5.1  HGB 7.4* 7.8* 7.9*  HCT 22.1* 24.9* 24.1*  PLT 106* 109* 108*    Recent Labs  Lab 02/04/21 0530 02/05/21 0512 02/06/21 0423 02/06/21 0931  NA 136 138 136  --   K 3.8 3.9 3.8  --   CL 102 104 101  --   CO2 26 27 26   --   BUN 17 19 21*  --   CREATININE 1.11* 1.13* 1.17*  --   CALCIUM 8.1*  8.3* 8.7*  --   PROT 4.2* 4.6* 4.5* 4.4*  BILITOT 0.7 0.5 0.7 0.6  ALKPHOS 72 83 74 76  ALT 61* 58* 51* 50*  AST 41 36 31 30  GLUCOSE 77 115* 99  --    Recent Labs  Lab 02/02/21 0458 02/03/21 0550 02/04/21 0530 02/05/21 0512 02/06/21 0423  WBC 7.3 6.5 5.6 5.4 5.1  HGB 8.2* 7.9* 7.4* 7.8* 7.9*  HCT 25.0* 23.4* 22.1* 24.9* 24.1*  PLT 104* 110* 106* 109* 108*  MCV 94.3 92.5 93.2 96.9 95.6  MCH 30.9 31.2 31.2 30.4 31.3  MCHC 32.8 33.8 33.5 31.3 32.8  RDW 16.1* 16.1* 16.5* 16.6* 16.6*    Recent Labs  Lab 02/02/21 0458 02/03/21 0550 02/04/21 0530 02/05/21 0512 02/06/21 0423 02/06/21 0931  NA 135 134* 136 138 136  --   K 3.7 3.4* 3.8 3.9 3.8  --   CL 100 101 102 104 101  --   CO2 24 26 26 27 26   --   GLUCOSE 117* 219* 77 115* 99  --   BUN 20 18 17 19  21*  --   CREATININE 1.11* 1.05* 1.11* 1.13* 1.17*  --   CALCIUM 8.4* 8.0* 8.1* 8.3* 8.7*  --   AST 49* 49* 41 36 31 30  ALT 71* 70* 61* 58* 51* 50*  ALKPHOS 83  70 72 83 74 76  BILITOT 0.9 0.7 0.7 0.5 0.7 0.6  ALBUMIN 2.3* 2.1* 2.1* 2.2* 2.2* 2.1*  MG 2.0  --  1.9  --   --   --     Radiology: no new imaging 9/13: afi 17, cephalic, normal placenta, bpp 8/8, efw 33%, 1875gm, ac 31%  Assessment & Plan:  Pt stable *Pregnancy: reactive nst. Fetal status reassuring. Continue with daily NSTs *h/o c-section: plan for rpt at 34wks. Pt requests on Tuesday. Can schedule with OR this upcoming week. Ask patient about BTL *Preterm: s/p betamethasone on 8/23 and 8/24. S/p NICU consult Continue medical management per medicine team. Labs look stable. *Dispo: inpatient until delivery  Cornelia Copa MD Attending Center for [facility]) GYN Consult Phone: [phone] (M-F, [phone]) & [phone]  (Off hours, weekends, holidays)

## 2021-02-08 LAB — COMPREHENSIVE METABOLIC PANEL
ALT: 48 U/L — ABNORMAL HIGH (ref 0–44)
AST: 34 U/L (ref 15–41)
Albumin: 2.2 g/dL — ABNORMAL LOW (ref 3.5–5.0)
Alkaline Phosphatase: 81 U/L (ref 38–126)
Anion gap: 8 (ref 5–15)
BUN: 22 mg/dL — ABNORMAL HIGH (ref 6–20)
CO2: 27 mmol/L (ref 22–32)
Calcium: 8.4 mg/dL — ABNORMAL LOW (ref 8.9–10.3)
Chloride: 100 mmol/L (ref 98–111)
Creatinine, Ser: 1.21 mg/dL — ABNORMAL HIGH (ref 0.44–1.00)
GFR, Estimated: >60 mL/min (ref >60)
Glucose, Bld: 95 mg/dL (ref 70–99)
Potassium: 3.6 mmol/L (ref 3.5–5.1)
Sodium: 135 mmol/L (ref 135–145)
Total Bilirubin: 1 mg/dL (ref 0.3–1.2)
Total Protein: 4.8 g/dL — ABNORMAL LOW (ref 6.5–8.1)

## 2021-02-08 LAB — CBC
HCT: 24.2 % — ABNORMAL LOW (ref 36.0–46.0)
Hemoglobin: 8.1 g/dL — ABNORMAL LOW (ref 12.0–15.0)
MCH: 31.4 pg (ref 26.0–34.0)
MCHC: 33.5 g/dL (ref 30.0–36.0)
MCV: 93.8 fL (ref 80.0–100.0)
Platelets: 129 10*3/uL — ABNORMAL LOW (ref 150–400)
RBC: 2.58 MIL/uL — ABNORMAL LOW (ref 3.87–5.11)
RDW: 16.2 % — ABNORMAL HIGH (ref 11.5–15.5)
WBC: 5.1 10*3/uL (ref 4.0–10.5)
nRBC: 0 % (ref 0.0–0.2)

## 2021-02-08 LAB — GLUCOSE, CAPILLARY: Glucose-Capillary: 99 mg/dL (ref 70–99)

## 2021-02-08 MED ORDER — LACTATED RINGERS IV BOLUS
500.0000 mL | Freq: Once | INTRAVENOUS | Status: AC
  Administered 2021-02-08: 500 mL via INTRAVENOUS

## 2021-02-08 NOTE — Progress Notes (Signed)
FACULTY PRACTICE ANTEPARTUM(COMPREHENSIVE) NOTE  Ashley Camacho is a 30 y.o. G2P0101 with Estimated Date of Delivery: 03/30/21   By  LMP [redacted]w[redacted]d  who is admitted for management of medical problems related to T1DM in the setting of IUP  Fetal presentation is cephalic. Length of Stay:  54  Days  Date of admission:12/16/2020  Subjective: Pt without OB complaints today, resting comfortably in bed.  Noted to have some mild regular contractions overnight with one episode of spotting- no further bleeding.  Contractions resolved with fluids.  Pt only reported pain as mild. Patient reports the fetal movement as active. Patient reports uterine contraction  activity as none. Patient reports  vaginal bleeding as none. Patient describes fluid per vagina as None.  Vitals:  Blood pressure (!) 146/65, pulse 87, temperature 97.8 F (36.6 C), temperature source Oral, resp. rate 18, height 5\' 1"  (1.549 m), weight 80.6 kg, SpO2 99 %. Vitals:   02/07/21 1513 02/07/21 2011 02/07/21 2328 02/08/21 0504  BP: (!) 142/64 (!) 145/63 (!) 143/63 (!) 146/65  Pulse: 92 86 80 87  Resp: 16 16 17 18   Temp: 98.2 F (36.8 C) 97.9 F (36.6 C) 98.1 F (36.7 C) 97.8 F (36.6 C)  TempSrc: Oral Oral Oral Oral  SpO2: 98% 99% 97% 99%  Weight:      Height:       Physical Examination:  General appearance - alert and in no distress Mental status - normal mood, behavior, speech, dress, motor activity, and thought processes Chest - CTAB Heart - S1 and S2 normal Abdomen - gravid and non-tender, no rebound or guarding Extremities -2+ edema, no calf tenderness bilaterally Skin - warm and dry   Fetal Monitoring:  Baseline: 150 bpm, Variability: moderate, Accelerations: +15x15 accels, and Decelerations: Absent    reactive  Labs:  Results for orders placed or performed during the hospital encounter of 12/16/20 (from the past 24 hour(s))  Glucose, capillary   Collection Time: 02/08/21  4:50 AM  Result Value Ref Range    Glucose-Capillary 99 70 - 99 mg/dL  CBC   Collection Time: 02/08/21  4:54 AM  Result Value Ref Range   WBC 5.1 4.0 - 10.5 K/uL   RBC 2.58 (L) 3.87 - 5.11 MIL/uL   Hemoglobin 8.1 (L) 12.0 - 15.0 g/dL   HCT 02/10/21 (L) 02/10/21 - 36.6 %   MCV 93.8 80.0 - 100.0 fL   MCH 31.4 26.0 - 34.0 pg   MCHC 33.5 30.0 - 36.0 g/dL   RDW 44.0 (H) 34.7 - 42.5 %   Platelets 129 (L) 150 - 400 K/uL   nRBC 0.0 0.0 - 0.2 %  Comprehensive metabolic panel   Collection Time: 02/08/21  4:54 AM  Result Value Ref Range   Sodium 135 135 - 145 mmol/L   Potassium 3.6 3.5 - 5.1 mmol/L   Chloride 100 98 - 111 mmol/L   CO2 27 22 - 32 mmol/L   Glucose, Bld 95 70 - 99 mg/dL   BUN 22 (H) 6 - 20 mg/dL   Creatinine, Ser 38.7 (H) 0.44 - 1.00 mg/dL   Calcium 8.4 (L) 8.9 - 10.3 mg/dL   Total Protein 4.8 (L) 6.5 - 8.1 g/dL   Albumin 2.2 (L) 3.5 - 5.0 g/dL   AST 34 15 - 41 U/L   ALT 48 (H) 0 - 44 U/L   Alkaline Phosphatase 81 38 - 126 U/L   Total Bilirubin 1.0 0.3 - 1.2 mg/dL   GFR, Estimated 02/10/21 5.64  mL/min   Anion gap 8 5 - 15    Imaging Studies:    No results found.   ASSESSMENT: G2P0101 [redacted]w[redacted]d Estimated Date of Delivery: 03/30/21  Patient Active Problem List   Diagnosis Date Noted   Acute esophagitis    Leg edema    History of preterm delivery, currently pregnant    Pre-existing type 1 diabetes mellitus during pregnancy in third trimester    Anemia during pregnancy    Type 1 diabetes mellitus affecting pregnancy in second trimester, antepartum    HFrEF (heart failure with reduced ejection fraction) (HCC)    Acute deep vein thrombosis (DVT) of brachial vein of right upper extremity (HCC) 12/17/2020   Anxiety 12/17/2020   Diabetic retinopathy (HCC) 12/17/2020   Acute on chronic combined systolic and diastolic CHF (congestive heart failure) (HCC)    Hyperemesis 12/13/2020   DKA, type 1, not at goal [facility]) 12/13/2020   Sunburn 12/13/2020   Hypothyroidism 12/10/2020   Malnutrition (HCC) 12/10/2020   Vitreous  hemorrhage of right eye (HCC) 12/10/2020   Pleural effusion associated with pulmonary infection 11/25/2020   Pneumonia affecting pregnancy 11/24/2020   Diabetes mellitus affecting pregnancy, second trimester 11/24/2020   Edema 11/24/2020   Decreased urine output 11/24/2020   Shortness of breath    Zinc deficiency    SOB (shortness of breath)    Social problem 11/21/2020   Nausea and vomiting during pregnancy prior to [redacted] weeks gestation 11/21/2020   Low serum vitamin B12    Delivery with history of C-section 11/20/2020   Absolute anemia    Acute kidney injury (HCC)    Nausea and vomiting during pregnancy 10/23/2020   History of premature delivery, currently pregnant, second trimester 10/13/2020   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   Depressive disorder 09/28/2018   Neuropathy 09/28/2018   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

## 2021-02-08 NOTE — Progress Notes (Signed)
Chart reviewed.  Hemoglobin is stable.  Reports of vaginal bleeding and transient uterine contractions.  No further reports of epistaxis or hemoptysis.  We will continue to hold heparin in the face of vaginal bleeding.  Hemoglobin is stable.  Appears hemodynamically stable as well.  Continue current management.

## 2021-02-09 DIAGNOSIS — Z3A33 33 weeks gestation of pregnancy: Secondary | ICD-10-CM

## 2021-02-09 LAB — COMPREHENSIVE METABOLIC PANEL
ALT: 44 U/L (ref 0–44)
AST: 29 U/L (ref 15–41)
Albumin: 2.4 g/dL — ABNORMAL LOW (ref 3.5–5.0)
Alkaline Phosphatase: 83 U/L (ref 38–126)
Anion gap: 10 (ref 5–15)
BUN: 24 mg/dL — ABNORMAL HIGH (ref 6–20)
CO2: 27 mmol/L (ref 22–32)
Calcium: 8.5 mg/dL — ABNORMAL LOW (ref 8.9–10.3)
Chloride: 98 mmol/L (ref 98–111)
Creatinine, Ser: 1.35 mg/dL — ABNORMAL HIGH (ref 0.44–1.00)
GFR, Estimated: 55 mL/min — ABNORMAL LOW (ref >60)
Glucose, Bld: 133 mg/dL — ABNORMAL HIGH (ref 70–99)
Potassium: 3.3 mmol/L — ABNORMAL LOW (ref 3.5–5.1)
Sodium: 135 mmol/L (ref 135–145)
Total Bilirubin: 0.9 mg/dL (ref 0.3–1.2)
Total Protein: 4.9 g/dL — ABNORMAL LOW (ref 6.5–8.1)

## 2021-02-09 LAB — CBC
HCT: 25.6 % — ABNORMAL LOW (ref 36.0–46.0)
Hemoglobin: 8.3 g/dL — ABNORMAL LOW (ref 12.0–15.0)
MCH: 30.7 pg (ref 26.0–34.0)
MCHC: 32.4 g/dL (ref 30.0–36.0)
MCV: 94.8 fL (ref 80.0–100.0)
Platelets: 144 10*3/uL — ABNORMAL LOW (ref 150–400)
RBC: 2.7 MIL/uL — ABNORMAL LOW (ref 3.87–5.11)
RDW: 16.2 % — ABNORMAL HIGH (ref 11.5–15.5)
WBC: 5.5 10*3/uL (ref 4.0–10.5)
nRBC: 0 % (ref 0.0–0.2)

## 2021-02-09 LAB — HEPARIN LEVEL (UNFRACTIONATED): Heparin Unfractionated: 0.25 IU/mL — ABNORMAL LOW (ref 0.30–0.70)

## 2021-02-09 LAB — GLUCOSE, CAPILLARY: Glucose-Capillary: 90 mg/dL (ref 70–99)

## 2021-02-09 MED ORDER — HEPARIN (PORCINE) 25000 UT/250ML-% IV SOLN
1300.0000 [IU]/h | INTRAVENOUS | Status: DC
  Administered 2021-02-09: 1350 [IU]/h via INTRAVENOUS
  Administered 2021-02-10 (×2): 1450 [IU]/h via INTRAVENOUS
  Administered 2021-02-11: 1300 [IU]/h via INTRAVENOUS
  Filled 2021-02-09 (×4): qty 250

## 2021-02-09 MED ORDER — HEPARIN BOLUS VIA INFUSION
1000.0000 [IU] | Freq: Once | INTRAVENOUS | Status: AC
  Administered 2021-02-09: 1000 [IU] via INTRAVENOUS
  Filled 2021-02-09: qty 1000

## 2021-02-09 MED ORDER — HEPARIN BOLUS VIA INFUSION
2000.0000 [IU] | Freq: Once | INTRAVENOUS | Status: DC
  Filled 2021-02-09: qty 2000

## 2021-02-09 MED ORDER — POTASSIUM CHLORIDE CRYS ER 20 MEQ PO TBCR
40.0000 meq | EXTENDED_RELEASE_TABLET | Freq: Once | ORAL | Status: AC
  Administered 2021-02-09: 40 meq via ORAL
  Filled 2021-02-09: qty 2

## 2021-02-09 NOTE — Progress Notes (Signed)
ANTICOAGULATION CONSULT NOTE - Initial Consult  Pharmacy Consult for Heparin Indication:  Right brachial VTE in pregnancy  No Known Allergies  Patient Measurements: Height: 5\' 1"  (154.9 cm) Weight: 76.1 kg (167 lb 12.8 oz) IBW/kg (Calculated) : 47.8 Heparin Dosing Weight: 64.8 kg  Vital Signs: Temp: 98.1 F (36.7 C) (09/19 0756) Temp Source: Oral (09/19 0756) BP: 155/61 (09/19 0758) Pulse Rate: 96 (09/19 0758)  Labs: Recent Labs    02/07/21 0425 02/08/21 0454 02/09/21 0822  HGB  --  8.1* 8.3*  HCT  --  24.2* 25.6*  PLT  --  129* 144*  HEPARINUNFRC 0.47  --   --   CREATININE  --  1.21* 1.35*    Estimated Creatinine Clearance: 57.4 mL/min (A) (by C-G formula based on SCr of 1.35 mg/dL (H)).   Medical History: Past Medical History:  Diagnosis Date   Anemia    Gastroparesis    Hypertension    Type 1 diabetes (HCC)     Medications:  Scheduled:   sodium chloride   Intravenous Once   Chlorhexidine Gluconate Cloth  6 each Topical Daily   cholecalciferol  1,000 Units Oral Daily   vitamin B-6  12.5 mg Oral BID   And   doxylamine (Sleep)  12.5 mg Oral BID   DULoxetine  20 mg Oral Daily   famotidine  20 mg Oral BID   feeding supplement (GLUCERNA SHAKE)  237 mL Oral BID BM   furosemide  40 mg Intravenous BID   heparin  2,000 Units Intravenous Once   hydrALAZINE  75 mg Oral Q8H   hydrocortisone  25 mg Oral Q24H   And   hydrocortisone  15 mg Oral Q24H   mouth rinse  15 mL Mouth Rinse q12n4p   metoCLOPramide (REGLAN) injection  5 mg Intravenous Q6H   pantoprazole  40 mg Oral Daily   prenatal vitamin w/FE, FA  1 tablet Oral Q1200   sodium chloride  1 spray Each Nare Q2H   sodium chloride flush  10-40 mL Intracatheter Q12H   sucralfate  1 g Oral TID WC & HS   terbutaline  0.25 mg Subcutaneous Once   vitamin B-12  1,000 mcg Oral Daily   Infusions:   dextrose 25 mL/hr at 02/08/21 1859   heparin     insulin 2.2 Units/hr (02/09/21 1027)    Assessment: 29 YOF  at [redacted]w[redacted]d gestation previously on heparin drip for right brachial VTE. The drip has been held since 9/17 for epistaxis, hemoptysis and vaginal bleeding. Bleeding has since resolved and hemoglobin has remained stable (8.3 g/dL). Last heparin level 9/17 therapeutic at 0.47, so will resume at the same rate.   Goal of Therapy:  Heparin level 0.3-0.7 units/ml Monitor platelets by anticoagulation protocol: Yes   Plan:  Resume heparin infusion rate of 1350 units/hr without bolus per hospitalist. Will obtain 6h level ~1800 today and adjust dose accordingly. Will resume daily heparin levels and continue to follow bleeding status, hemoglobin and platelet count.   Thank you for allowing pharmacy to take part in this patient's care.   10/17, PharmD, MHSA, BCPPS 02/09/2021,10:57 AM

## 2021-02-09 NOTE — Progress Notes (Signed)
Physical Therapy Treatment Patient Details Name: Ashley Camacho MRN: [id number] DOB: [birth year] Today's Date: 02/09/2021   History of Present Illness 30 y.o. 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 Medical history significant of [redacted]w[redacted]d on presentation, Estimated Date of Delivery: 03/30/21 by by 9 week ultrasound however trying to make it to 34w for planned c-section, Type 1 diabetic, Cardiomyopathy, chronic systolic heart failure, DVT in bilateral arm. H/o recent hospitalizations d/t hyperemesis and sepsis secondary to pneumonia.    PT Comments    Pt continues to have improved activity tolerance. Instructed pt to amb to nurses station and back at least 2x/day to continue to work toward incr endurance in preparation for return home after delivery.   Recommendations for follow up therapy are one component of a multi-disciplinary discharge planning process, led by the attending physician.  Recommendations may be updated based on patient status, additional functional criteria and insurance authorization.  Follow Up Recommendations  No PT follow up     Equipment Recommendations  None recommended by PT    Recommendations for Other Services       Precautions / Restrictions Precautions Precautions: Other (comment) Precaution Comments: swelling in LEs, pregnancy     Mobility  Bed Mobility Overal bed mobility: Modified Independent Bed Mobility: Supine to Sit     Supine to sit: Modified independent (Device/Increase time);HOB elevated          Transfers Overall transfer level: Modified independent Equipment used: None Transfers: Sit to/from Stand Sit to Stand: Modified independent (Device/Increase time)            Ambulation/Gait Ambulation/Gait assistance: Modified independent (Device/Increase time);Supervision Gait Distance (Feet): 200 Feet (200' x 1, 100'x  2) Assistive device: None;IV Pole Gait Pattern/deviations: Step-through pattern;Wide base of support Gait velocity: decreased Gait velocity interpretation: 1.31 - 2.62 ft/sec, indicative of limited community ambulator General Gait Details: Pt continues to fatigue with incr amb distance and requires sitting rest break   Stairs             Wheelchair Mobility    Modified Rankin (Stroke Patients Only)       Balance Overall balance assessment: Mild deficits observed, not formally tested                                          Cognition Arousal/Alertness: Awake/alert Behavior During Therapy: WFL for tasks assessed/performed Overall Cognitive Status: Within Functional Limits for tasks assessed                                        Exercises      General Comments        Pertinent Vitals/Pain Pain Assessment: Faces Faces Pain Scale: Hurts a little bit Pain Location: lt hipo Pain Descriptors / Indicators: Grimacing Pain Intervention(s): Monitored during session    Home Living                      Prior Function            PT Goals (current goals can now be found in the care plan section) Acute Rehab PT Goals Patient Stated Goal: have a healthy baby Progress towards PT goals: Progressing toward goals    Frequency  Min 2X/week      PT Plan Current plan remains appropriate    Co-evaluation              AM-PAC PT "6 Clicks" Mobility   Outcome Measure  Help needed turning from your back to your side while in a flat bed without using bedrails?: None Help needed moving from lying on your back to sitting on the side of a flat bed without using bedrails?: None Help needed moving to and from a bed to a chair (including a wheelchair)?: None Help needed standing up from a chair using your arms (e.g., wheelchair or bedside chair)?: None Help needed to walk in hospital room?: None Help needed climbing 3-5  steps with a railing? : A Little 6 Click Score: 23    End of Session   Activity Tolerance: Patient tolerated treatment well Patient left: in bed;with call bell/phone within reach (sitting EOB) Nurse Communication: Mobility status PT Visit Diagnosis: Other abnormalities of gait and mobility (R26.89);Muscle weakness (generalized) (M62.81)     Time: 2694-8546 PT Time Calculation (min) (ACUTE ONLY): 14 min  Charges:  $Gait Training: 8-22 mins                     [facility] PT Acute Rehabilitation Services Pager [phone] Office [phone]    Angelina Ok [facility] 02/09/2021, 1:04 PM

## 2021-02-09 NOTE — Progress Notes (Signed)
NST reactive by strict criteria.  Category 1 strip.  Moderate variability, accels noted, occasional variable decel.  Toco sporadic contractions.

## 2021-02-09 NOTE — Progress Notes (Signed)
PROGRESS NOTE  Ashley Camacho BWG:[id number] DOB: September 15, 1990 DOA: 12/16/2020 PCP: Hildred Laser, MD  HPI/Recap of past 24 hours: Ashley Camacho is a 30 y.o. G2P0101 with a history of T1DM, chronic HFrEF who was admitted initially 6/30 to [facility] for nausea and vomiting subsequently transferred to [facility] for rhinovirus pneumonia, discharged 7/20 only to return 7/22 with starvation ketosis and brachial vein DVT. TPN thru PICC was started and ultimately the patient was transferred to [facility] for cortrack placement on 7/26. Further complications detailed below:  7/27 cardiology consulted for cardiomyopathy, PICC line inserted, tube feeding started. 7/29 having recurrent retention, Foley catheter inserted 7/30 PRBC transfused 8/5 PRBC transfused 8/15 UCX pansensitive Enterococcus faecalis. 8/16 cardiology signed off, no follow-up recommended 8/19 nephrology consulted, no follow-up recommended 8/20 PRBC transfused 8/21 rescue course of betamethasone 8/22 Foley catheter reinserted after removal due to retention. 8/23 GI consulted. MFM consulted, nephrology signed off. 8/24 underwent EGD shows severe esophagitis with mild oozing.   Dobbhoff removed patient on oral diet. 8/25 PICC line removed. 8/26 started on steroids for adrenal insufficiency. 8/27 2 episodes of hematemesis.  Started on PPI drip.  GI reconsulted.  Lovenox held. 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 8/30 PICC line reinserted due to poor IV access, difficulty with blood draws and need for IV heparin. 9/9 Hematuria, IV Lasix stopped and started on p.o. amoxicillin 500 mg twice daily due to concern for hemorrhagic cystitis.  Augmentin was stopped on 02/01/21  9/13: Still with hematuria.  Restarted Iv lasix 20 mg daily x 3 days, Kcl po 20 meq x 3 days.  Assessment/Plan: Active Problems:   Delivery with history of C-section   Acute kidney injury (HCC)   Hyperemesis   DKA, type 1, not  at goal [facility])   Acute deep vein thrombosis (DVT) of brachial vein of right upper extremity (HCC)   Vitreous hemorrhage of right eye (HCC)   Gastroparesis due to DM (HCC)   Diabetic retinopathy (HCC)   Acute on chronic combined systolic and diastolic CHF (congestive heart failure) (HCC)   Type 1 diabetes mellitus affecting pregnancy in second trimester, antepartum   HFrEF (heart failure with reduced ejection fraction) (HCC)   Anemia during pregnancy   Pre-existing type 1 diabetes mellitus during pregnancy in third trimester   History of preterm delivery, currently pregnant   Leg edema   Acute esophagitis  Resolved hematemesis due to grade C esophagitis: Noted on EGD 8/24 by Dr. Orvan Falconer.  IV Protonix switched to oral on 01/28/2021.  Continue Pepcid, Protonix and Carafate No plan to repeat endoscopic evaluation at this time.  Gross hematuria, unclear etiology Initial concern for hemorrhagic cystitis but repeat urine cx was negative.  Augmentin started on 9/9 was Dced on 9/11 by obgyn Renal ultrasound obtained as discussed below.  I personally saw the urine a few days ago.  It was dark but I do not think it had any hematuria.  Patient tells me that her urine is now clear.  Nonetheless, patient told me that she has been told by her OB physician that they have discussed her case with urology and since she is pregnant, they cannot do any cystoscopy at this point in time anyways.  We will continue to monitor her hemoglobin which has remained stable since last several days now.  Acute urinary retention/bilateral hydronephrosis Retention improved.  Voiding spontaneously.  Renal ultrasound showed right kidney moderate hydronephrosis, slightly improved compared to recent prior examination,  left kidney mild hydronephrosis which is new.  We will repeat ultrasound on 02/12/2021.  Nausea and vomiting/hyperemesis/gastroparesis in the setting of pregnancy Management per OB. Decreased insulin rate on 9/11 to  avoid hypoglycemia with poor oral intake.  According to patient, she has no more nausea and her oral intake is improved and she is consuming about 100% of her lunch and dinner.  Remains on insulin drip. Closely monitor for hypoglycemia  Elevated liver chemistries, downtrending Normal T bilirubin.  Does not appear to be hemolyzing. Continue to closely monitor intermittently.  Acute blood loss anemia in the setting of hematuria Hemoglobin is stable between 7 and 8.  Transfuse if drops less than 7.  Recurrent Hypoglycemia, due to poor oral intake, nausea with vomiting. Continue insulin drip and D10. Continue to encourage increase in oral intake. Antiemetics as needed  Worsening Anasarca due to severe malnutrition related to hyperemesis as above as well as acute on chronic HFrEF, HTN:  Updated echo showed improved LVEF 45% now up to 50-55%. RV and pulmonary artery pressures wnl. Small pericardial effusion noted without tamponade. Off Lasix since yesterday.  Patient tells me that her edema has worsened and she is requesting to be placed back on Lasix.  On exam, she does have +3 pitting edema all the way up to the waist.  We will continue 40 mg IV Lasix twice daily daily until next few days.  Monitor renal function daily.  Chronic insomnia She is currently on Unisom 12.5 mg twice daily  Acute thrombocytopenia Improving.  Monitor closely.  Adrenal insufficiency: ACTH stim test diagnostic.  Continue Cortef 25 mg in the morning and 15 mg at 5 PM.  Will need to stress dose around her C-section at 100 mg IV every 8 hours for 1 day followed by 50 mg every 8 hours for 1 day followed by 50 mg twice daily for 1 day and then back to her current dose.   Right brachial vein DVT: Dx 12/07/20.  Seems to be recannulating based on repeat U/S.  Resume heparin drip today.   T1DM with gastroparesis and peripheral neuropathy: Brittle control due to sensitivity and inconsistent calorie inputs. HbA1c was 5.8%  11/21/2020 (impacted by mid-late pregnancy).  She is on scheduled IV Reglan We will keep on insulin drip and D10 W as she is approaching her delivery date. Appreciate diabetes coordinator's assistance. Avoid hypoglycemia.  Hypokalemia: Resolved.  Hypomagnesemia: Resolved   HTN: Fairly controlled, continue to closely monitor. Continue p.o. hydralazine 75mg  q8h.  Continue to monitor for preeclampsia.    Resolved nonoliguric AKI, prerenal in the setting of dehydration from poor oral intake, intake is now improved:  Creatinine 1.05 from 1.11 from 1.16.  GFR greater than 60. Avoid nephrotoxic agents Monitor back on diuretic Continue to monitor urine output  IUP:  - MFM recommends rLTCS at 34 weeks (9/20). Continue NSTs/BPP/serial growth scans. Leaving orders in this regard to MFM/OB.  NST on 9/8 was reactive, reassuring.  - Has received BMZ - Procardia given 9/5 and on 01/29/2021 for contractions.  - OB co-managing, appreciate OB/GYN's assistance.   Treated Enterococcus faecalis UTI: Urine culture collected from 01/05/2021, pansensitive, treated with amoxicillin 8/16 - 8/22.   Limited venous access:  - PICC reinserted 8/30.  Epistaxis/vaginal bleeding: Due to reports of epistaxis on 02/07/2021, heparin drip was stopped.  She also had spotting of vaginal bleeding yesterday.  According to patient, no further bleeding.  We will resume heparin drip without bolus.  Discussed with patient's primary RN  as well as patient to let me know if she has significant bleeding.  Despite of reports of bleeding, hemoglobin has remained stable.   DVT prophylaxis: Heparin gtt Code Status: Full Family Communication: None at bedside Disposition Plan:  Status is: Inpatient   Remains inpatient appropriate because:IV treatments appropriate due to intensity of illness or inability to take PO   Dispo: The patient is from: Home              Anticipated d/c is to: Home with OB/GYN signs off.              Patient  currently is not medically stable to d/c.   Consultants:  OB/GYN Nephrology Cardiology GI  Subjective: Seen and examined.  She has no complaints.  Her urine is clear now.  No further bleeding from nose or vagina.  Patient has not seen her husband or her daughter since more than 10 days now since her husband's car is broken.  Discussed with patient's primary RN.  Will involve social worker to see how we can help the situation so Hendy can see her family.  This will definitely boost her Mood and morale since she has been here for so long.  Objective: Vitals:   02/09/21 0100 02/09/21 0458 02/09/21 0756 02/09/21 0758  BP: (!) 142/61 (!) 144/61 (!) 160/61 (!) 155/61  Pulse: 89 87 96 96  Resp: 16 18 18    Temp: 98.2 F (36.8 C) 98.2 F (36.8 C) 98.1 F (36.7 C)   TempSrc: Oral Oral Oral   SpO2: 99% 96% 99%   Weight:      Height:        Intake/Output Summary (Last 24 hours) at 02/09/2021 1011 Last data filed at 02/09/2021 0758 Gross per 24 hour  Intake 842.45 ml  Output 4350 ml  Net -3507.55 ml    Filed Weights   01/24/21 0603 01/26/21 0530 02/08/21 1157  Weight: 81.7 kg 80.6 kg 76.1 kg    Exam: General exam: Appears calm and comfortable  Respiratory system: Clear to auscultation. Respiratory effort normal. Cardiovascular system: S1 & S2 heard, RRR. No JVD, murmurs, rubs, gallops or clicks.  +2 pitting edema bilateral lower extremity. Gastrointestinal system: Abdomen is nondistended, soft and nontender. No organomegaly or masses felt. Normal bowel sounds heard. Central nervous system: Alert and oriented. No focal neurological deficits. Extremities: Symmetric 5 x 5 power. Skin: No rashes, lesions or ulcers.  Psychiatry: Judgement and insight appear normal. Mood & affect appropriate.    Data Reviewed: CBC: Recent Labs  Lab 02/04/21 0530 02/05/21 0512 02/06/21 0423 02/08/21 0454 02/09/21 0822  WBC 5.6 5.4 5.1 5.1 5.5  HGB 7.4* 7.8* 7.9* 8.1* 8.3*  HCT 22.1* 24.9*  24.1* 24.2* 25.6*  MCV 93.2 96.9 95.6 93.8 94.8  PLT 106* 109* 108* 129* 144*    Basic Metabolic Panel: Recent Labs  Lab 02/04/21 0530 02/05/21 0512 02/06/21 0423 02/08/21 0454 02/09/21 0822  NA 136 138 136 135 135  K 3.8 3.9 3.8 3.6 3.3*  CL 102 104 101 100 98  CO2 26 27 26 27 27   GLUCOSE 77 115* 99 95 133*  BUN 17 19 21* 22* 24*  CREATININE 1.11* 1.13* 1.17* 1.21* 1.35*  CALCIUM 8.1* 8.3* 8.7* 8.4* 8.5*  MG 1.9  --   --   --   --     GFR: Estimated Creatinine Clearance: 57.4 mL/min (A) (by C-G formula based on SCr of 1.35 mg/dL (H)). Liver Function Tests: Recent Labs  Lab 02/05/21 0512 02/06/21 0423 02/06/21 0931 02/08/21 0454 02/09/21 0822  AST 36 31 30 34 29  ALT 58* 51* 50* 48* 44  ALKPHOS 83 74 76 81 83  BILITOT 0.5 0.7 0.6 1.0 0.9  PROT 4.6* 4.5* 4.4* 4.8* 4.9*  ALBUMIN 2.2* 2.2* 2.1* 2.2* 2.4*    No results for input(s): LIPASE, AMYLASE in the last 168 hours. No results for input(s): AMMONIA in the last 168 hours. Coagulation Profile: No results for input(s): INR, PROTIME in the last 168 hours. Cardiac Enzymes: No results for input(s): CKTOTAL, CKMB, CKMBINDEX, TROPONINI in the last 168 hours. BNP (last 3 results) No results for input(s): PROBNP in the last 8760 hours. HbA1C: No results for input(s): HGBA1C in the last 72 hours. CBG: Recent Labs  Lab 02/05/21 0649 02/06/21 0501 02/07/21 0502 02/08/21 0450 02/09/21 0523  GLUCAP 112* 74 73 99 90    Lipid Profile: No results for input(s): CHOL, HDL, LDLCALC, TRIG, CHOLHDL, LDLDIRECT in the last 72 hours. Thyroid Function Tests: No results for input(s): TSH, T4TOTAL, FREET4, T3FREE, THYROIDAB in the last 72 hours. Anemia Panel: No results for input(s): VITAMINB12, FOLATE, FERRITIN, TIBC, IRON, RETICCTPCT in the last 72 hours.  Urine analysis:    Component Value Date/Time   COLORURINE AMBER (A) 01/10/2021 0131   APPEARANCEUR CLOUDY (A) 01/10/2021 0131   APPEARANCEUR Clear 09/18/2020 1153    LABSPEC 1.017 01/10/2021 0131   LABSPEC 1.026 03/01/2013 1844   PHURINE 5.0 01/10/2021 0131   GLUCOSEU NEGATIVE 01/10/2021 0131   GLUCOSEU >=500 03/01/2013 1844   HGBUR NEGATIVE 01/10/2021 0131   BILIRUBINUR NEGATIVE 01/10/2021 0131   BILIRUBINUR neg 10/23/2020 0953   BILIRUBINUR Negative 09/18/2020 1153   BILIRUBINUR Negative 03/01/2013 1844   KETONESUR NEGATIVE 01/10/2021 0131   PROTEINUR NEGATIVE 01/10/2021 0131   UROBILINOGEN 0.2 10/23/2020 0953   NITRITE NEGATIVE 01/10/2021 0131   LEUKOCYTESUR SMALL (A) 01/10/2021 0131   LEUKOCYTESUR Negative 03/01/2013 1844   Sepsis Labs: @LABRCNTIP (procalcitonin:4,lacticidven:4)  ) No results found for this or any previous visit (from the past 240 hour(s)).     Studies: No results found.  Scheduled Meds:  sodium chloride   Intravenous Once   Chlorhexidine Gluconate Cloth  6 each Topical Daily   cholecalciferol  1,000 Units Oral Daily   vitamin B-6  12.5 mg Oral BID   And   doxylamine (Sleep)  12.5 mg Oral BID   DULoxetine  20 mg Oral Daily   famotidine  20 mg Oral BID   feeding supplement (GLUCERNA SHAKE)  237 mL Oral BID BM   furosemide  40 mg Intravenous BID   hydrALAZINE  75 mg Oral Q8H   hydrocortisone  25 mg Oral Q24H   And   hydrocortisone  15 mg Oral Q24H   mouth rinse  15 mL Mouth Rinse q12n4p   metoCLOPramide (REGLAN) injection  5 mg Intravenous Q6H   pantoprazole  40 mg Oral Daily   potassium chloride  40 mEq Oral Once   prenatal vitamin w/FE, FA  1 tablet Oral Q1200   sodium chloride  1 spray Each Nare Q2H   sodium chloride flush  10-40 mL Intracatheter Q12H   sucralfate  1 g Oral TID WC & HS   terbutaline  0.25 mg Subcutaneous Once   vitamin B-12  1,000 mcg Oral Daily    Continuous Infusions:  dextrose 25 mL/hr at 02/08/21 1859   insulin 1.9 Units/hr (02/09/21 0904)     LOS: 55 days  Hughie Closs, MD [facility]  If 7PM-7AM, please contact night-coverage [email or web address] 02/09/2021,  10:11 AM

## 2021-02-09 NOTE — Progress Notes (Signed)
ANTICOAGULATION CONSULT NOTE - Follow Up Consult  Pharmacy Consult for Heparin Indication: Right brachial VTE in pregnancy  No Known Allergies  Patient Measurements: Height: 5\' 1"  (154.9 cm) Weight: 76.1 kg (167 lb 12.8 oz) IBW/kg (Calculated) : 47.8 Heparin Dosing Weight: 64.8kg  Vital Signs: Temp: 98.5 F (36.9 C) (09/19 1952) Temp Source: Oral (09/19 1952) BP: 144/60 (09/19 1952) Pulse Rate: 87 (09/19 1952)  Labs: Recent Labs    02/07/21 0425 02/08/21 0454 02/09/21 0822 02/09/21 1855  HGB  --  8.1* 8.3*  --   HCT  --  24.2* 25.6*  --   PLT  --  129* 144*  --   HEPARINUNFRC 0.47  --   --  0.25*  CREATININE  --  1.21* 1.35*  --     Estimated Creatinine Clearance: 57.4 mL/min (A) (by C-G formula based on SCr of 1.35 mg/dL (H)).  Assessment: 30yo F at [redacted]w[redacted]d gestation previously on Heparin drip for right brachial DVT. Drip held 9/17- 9/19 and restarted today at 1242. Heparin level at 1855= 0.25 units/ml.Will give 2 unit/kg bolus and increase drip to 1450 units/hr (14.11ml/hr). Goal of Therapy:  Heparin level 0.3-0.7 units/ml Monitor platelets by anticoagulation protocol: Yes   Plan:  Heparin 1000 unit bolus x 1 and increase Heparin drip to 1450 units/hr (14.68ml/hr) Check Heparin level with am labs and adjust to target goal.  Will monitor bleeding, H/H and platelets  4m 02/09/2021,8:34 PM

## 2021-02-09 NOTE — Progress Notes (Signed)
FACULTY PRACTICE ANTEPARTUM PROGRESS NOTE  Ashley Camacho is a 30 y.o. G2P0101 at [redacted]w[redacted]d who is admitted for Type 1 DM complications and gastroparesis.  Estimated Date of Delivery: 03/30/21 Fetal presentation is cephalic.  Length of Stay:  55 Days. Admitted 12/16/2020  Subjective: Pt seen.  Awake and alert.  Pt resting comfortably.  Pt did note some irregular contractions yesterday which resolved with po procardia.   Patient reports normal fetal movement.  She denies bleeding and leaking of fluid per vagina.  Vitals:  Blood pressure (!) 133/56, pulse 90, temperature 97.9 F (36.6 C), temperature source Oral, resp. rate 20, height 5\' 1"  (1.549 m), weight 76.1 kg, SpO2 99 %. Physical Examination: CONSTITUTIONAL: Well-developed, well-nourished female in no acute distress.  HENT:  Normocephalic, atraumatic, External right and left ear normal. Oropharynx is clear and moist EYES: Conjunctivae and EOM are normal.  NECK: Normal range of motion, supple, no masses. SKIN: Skin is warm and dry. No rash noted. Not diaphoretic. No erythema. No pallor. NEUROLGIC: Alert and oriented to person, place, and time. Normal reflexes, muscle tone coordination. No cranial nerve deficit noted. PSYCHIATRIC: Normal mood and affect. Normal behavior. Normal judgment and thought content. CARDIOVASCULAR: Normal heart rate noted, regular rhythm RESPIRATORY: Effort and breath sounds normal, no problems with respiration noted MUSCULOSKELETAL: Normal range of motion. No edema and no tenderness. ABDOMEN: Soft, nontender, nondistended, gravid. CERVIX: deferred  Fetal monitoring: pending for the day  Results for orders placed or performed during the hospital encounter of 12/16/20 (from the past 48 hour(s))  Glucose, capillary     Status: None   Collection Time: 02/08/21  4:50 AM  Result Value Ref Range   Glucose-Capillary 99 70 - 99 mg/dL    Comment: Glucose reference range applies only to samples taken after fasting for  at least 8 hours.  CBC     Status: Abnormal   Collection Time: 02/08/21  4:54 AM  Result Value Ref Range   WBC 5.1 4.0 - 10.5 K/uL   RBC 2.58 (L) 3.87 - 5.11 MIL/uL   Hemoglobin 8.1 (L) 12.0 - 15.0 g/dL   HCT 02/10/21 (L) 19.4 - 17.4 %   MCV 93.8 80.0 - 100.0 fL   MCH 31.4 26.0 - 34.0 pg   MCHC 33.5 30.0 - 36.0 g/dL   RDW 08.1 (H) 44.8 - 18.5 %   Platelets 129 (L) 150 - 400 K/uL    Comment: REPEATED TO VERIFY   nRBC 0.0 0.0 - 0.2 %    Comment: Performed at [facility], [address] [place], [place]  Comprehensive metabolic panel     Status: Abnormal   Collection Time: 02/08/21  4:54 AM  Result Value Ref Range   Sodium 135 135 - 145 mmol/L   Potassium 3.6 3.5 - 5.1 mmol/L   Chloride 100 98 - 111 mmol/L   CO2 27 22 - 32 mmol/L   Glucose, Bld 95 70 - 99 mg/dL    Comment: Glucose reference range applies only to samples taken after fasting for at least 8 hours.   BUN 22 (H) 6 - 20 mg/dL   Creatinine, Ser 02/10/21 (H) 0.44 - 1.00 mg/dL   Calcium 8.4 (L) 8.9 - 10.3 mg/dL   Total Protein 4.8 (L) 6.5 - 8.1 g/dL   Albumin 2.2 (L) 3.5 - 5.0 g/dL   AST 34 15 - 41 U/L   ALT 48 (H) 0 - 44 U/L   Alkaline Phosphatase 81 38 - 126  U/L   Total Bilirubin 1.0 0.3 - 1.2 mg/dL   GFR, Estimated >62 >70 mL/min    Comment: (NOTE) Calculated using the CKD-EPI Creatinine Equation (2021)    Anion gap 8 5 - 15    Comment: Performed at [facility], [address], [place], [place] [address]  Glucose, capillary     Status: None   Collection Time: 02/09/21  5:23 AM  Result Value Ref Range   Glucose-Capillary 90 70 - 99 mg/dL    Comment: Glucose reference range applies only to samples taken after fasting for at least 8 hours.  CBC     Status: Abnormal   Collection Time: 02/09/21  8:22 AM  Result Value Ref Range   WBC 5.5 4.0 - 10.5 K/uL   RBC 2.70 (L) 3.87 - 5.11 MIL/uL   Hemoglobin 8.3 (L) 12.0 - 15.0 g/dL   HCT 38.1 (L) 82.9 - 93.7 %   MCV 94.8 80.0 - 100.0 fL   MCH 30.7 26.0  - 34.0 pg   MCHC 32.4 30.0 - 36.0 g/dL   RDW 16.9 (H) 67.8 - 93.8 %   Platelets 144 (L) 150 - 400 K/uL   nRBC 0.0 0.0 - 0.2 %    Comment: Performed at [facility], [address], [place], [place] [address]  Comprehensive metabolic panel     Status: Abnormal   Collection Time: 02/09/21  8:22 AM  Result Value Ref Range   Sodium 135 135 - 145 mmol/L   Potassium 3.3 (L) 3.5 - 5.1 mmol/L   Chloride 98 98 - 111 mmol/L   CO2 27 22 - 32 mmol/L   Glucose, Bld 133 (H) 70 - 99 mg/dL    Comment: Glucose reference range applies only to samples taken after fasting for at least 8 hours.   BUN 24 (H) 6 - 20 mg/dL   Creatinine, Ser 1.02 (H) 0.44 - 1.00 mg/dL   Calcium 8.5 (L) 8.9 - 10.3 mg/dL   Total Protein 4.9 (L) 6.5 - 8.1 g/dL   Albumin 2.4 (L) 3.5 - 5.0 g/dL   AST 29 15 - 41 U/L   ALT 44 0 - 44 U/L   Alkaline Phosphatase 83 38 - 126 U/L   Total Bilirubin 0.9 0.3 - 1.2 mg/dL   GFR, Estimated 55 (L) >60 mL/min    Comment: (NOTE) Calculated using the CKD-EPI Creatinine Equation (2021)    Anion gap 10 5 - 15    Comment: Performed at [facility], [address] [place], [place] [address]    I have reviewed the patient's current medications.  ASSESSMENT: Active Problems:   Delivery with history of C-section   Acute kidney injury (HCC)   Hyperemesis   DKA, type 1, not at goal [facility])   Acute deep vein thrombosis (DVT) of brachial vein of right upper extremity (HCC)   Vitreous hemorrhage of right eye (HCC)   Gastroparesis due to DM (HCC)   Diabetic retinopathy (HCC)   Acute on chronic combined systolic and diastolic CHF (congestive heart failure) (HCC)   Type 1 diabetes mellitus affecting pregnancy in second trimester, antepartum   HFrEF (heart failure with reduced ejection fraction) (HCC)   Anemia during pregnancy   Pre-existing type 1 diabetes mellitus during pregnancy in third trimester   History of preterm delivery, currently pregnant   Leg edema   Acute  esophagitis   PLAN: NST pending, BPP tomorrow Repeat c/s at 34 weeks with stress does steroids  Diabetes and medical management per IM Continue inpatient management until delivery.   Continue routine antenatal care.   Mariel Aloe, MD [facility] Faculty Attending, [facility] 02/09/2021 1:08 PM

## 2021-02-10 ENCOUNTER — Inpatient Hospital Stay (HOSPITAL_BASED_OUTPATIENT_CLINIC_OR_DEPARTMENT_OTHER): Payer: Medicaid Other

## 2021-02-10 DIAGNOSIS — Z0371 Encounter for suspected problem with amniotic cavity and membrane ruled out: Secondary | ICD-10-CM

## 2021-02-10 DIAGNOSIS — E109 Type 1 diabetes mellitus without complications: Secondary | ICD-10-CM | POA: Diagnosis not present

## 2021-02-10 DIAGNOSIS — O99013 Anemia complicating pregnancy, third trimester: Secondary | ICD-10-CM | POA: Diagnosis not present

## 2021-02-10 DIAGNOSIS — E876 Hypokalemia: Secondary | ICD-10-CM

## 2021-02-10 DIAGNOSIS — Z3A33 33 weeks gestation of pregnancy: Secondary | ICD-10-CM

## 2021-02-10 DIAGNOSIS — D649 Anemia, unspecified: Secondary | ICD-10-CM | POA: Diagnosis not present

## 2021-02-10 DIAGNOSIS — O24013 Pre-existing diabetes mellitus, type 1, in pregnancy, third trimester: Secondary | ICD-10-CM

## 2021-02-10 LAB — HEPARIN LEVEL (UNFRACTIONATED): Heparin Unfractionated: 0.72 IU/mL — ABNORMAL HIGH (ref 0.30–0.70)

## 2021-02-10 LAB — CBC
HCT: 25.1 % — ABNORMAL LOW (ref 36.0–46.0)
Hemoglobin: 8.1 g/dL — ABNORMAL LOW (ref 12.0–15.0)
MCH: 30.6 pg (ref 26.0–34.0)
MCHC: 32.3 g/dL (ref 30.0–36.0)
MCV: 94.7 fL (ref 80.0–100.0)
Platelets: 156 10*3/uL (ref 150–400)
RBC: 2.65 MIL/uL — ABNORMAL LOW (ref 3.87–5.11)
RDW: 16.2 % — ABNORMAL HIGH (ref 11.5–15.5)
WBC: 5.7 10*3/uL (ref 4.0–10.5)
nRBC: 0 % (ref 0.0–0.2)

## 2021-02-10 LAB — COMPREHENSIVE METABOLIC PANEL
ALT: 39 U/L (ref 0–44)
AST: 29 U/L (ref 15–41)
Albumin: 2.4 g/dL — ABNORMAL LOW (ref 3.5–5.0)
Alkaline Phosphatase: 78 U/L (ref 38–126)
Anion gap: 11 (ref 5–15)
BUN: 27 mg/dL — ABNORMAL HIGH (ref 6–20)
CO2: 28 mmol/L (ref 22–32)
Calcium: 8.3 mg/dL — ABNORMAL LOW (ref 8.9–10.3)
Chloride: 94 mmol/L — ABNORMAL LOW (ref 98–111)
Creatinine, Ser: 1.3 mg/dL — ABNORMAL HIGH (ref 0.44–1.00)
GFR, Estimated: 57 mL/min — ABNORMAL LOW (ref >60)
Glucose, Bld: 357 mg/dL — ABNORMAL HIGH (ref 70–99)
Potassium: 3.3 mmol/L — ABNORMAL LOW (ref 3.5–5.1)
Sodium: 133 mmol/L — ABNORMAL LOW (ref 135–145)
Total Bilirubin: 0.5 mg/dL (ref 0.3–1.2)
Total Protein: 4.8 g/dL — ABNORMAL LOW (ref 6.5–8.1)

## 2021-02-10 LAB — HEPATIC FUNCTION PANEL
ALT: 42 U/L (ref 0–44)
AST: 34 U/L (ref 15–41)
Albumin: 2.5 g/dL — ABNORMAL LOW (ref 3.5–5.0)
Alkaline Phosphatase: 78 U/L (ref 38–126)
Bilirubin, Direct: 0.1 mg/dL (ref 0.0–0.2)
Indirect Bilirubin: 0.5 mg/dL (ref 0.3–0.9)
Total Bilirubin: 0.6 mg/dL (ref 0.3–1.2)
Total Protein: 5.2 g/dL — ABNORMAL LOW (ref 6.5–8.1)

## 2021-02-10 LAB — TYPE AND SCREEN
ABO/RH(D): A POS
Antibody Screen: NEGATIVE

## 2021-02-10 LAB — GLUCOSE, CAPILLARY: Glucose-Capillary: 99 mg/dL (ref 70–99)

## 2021-02-10 LAB — MAGNESIUM: Magnesium: 1.7 mg/dL (ref 1.7–2.4)

## 2021-02-10 MED ORDER — POTASSIUM CHLORIDE CRYS ER 20 MEQ PO TBCR
40.0000 meq | EXTENDED_RELEASE_TABLET | Freq: Every day | ORAL | Status: DC
  Administered 2021-02-10 – 2021-02-19 (×9): 40 meq via ORAL
  Filled 2021-02-10 (×10): qty 2

## 2021-02-10 MED ORDER — POTASSIUM CHLORIDE CRYS ER 20 MEQ PO TBCR
40.0000 meq | EXTENDED_RELEASE_TABLET | Freq: Once | ORAL | Status: AC
  Administered 2021-02-10: 40 meq via ORAL
  Filled 2021-02-10: qty 2

## 2021-02-10 MED ORDER — MAGNESIUM SULFATE 2 GM/50ML IV SOLN
2.0000 g | Freq: Once | INTRAVENOUS | Status: AC
  Administered 2021-02-10: 2 g via INTRAVENOUS
  Filled 2021-02-10: qty 50

## 2021-02-10 MED ORDER — FUROSEMIDE 10 MG/ML IJ SOLN
40.0000 mg | Freq: Two times a day (BID) | INTRAMUSCULAR | Status: AC
  Administered 2021-02-10 – 2021-02-12 (×6): 40 mg via INTRAVENOUS
  Filled 2021-02-10 (×7): qty 4

## 2021-02-10 NOTE — Progress Notes (Signed)
ANTICOAGULATION CONSULT NOTE - Follow Up Consult  Pharmacy Consult for heparin Indication:  right brachial VTE in pregnancy  No Known Allergies  Patient Measurements: Height: 5\' 1"  (154.9 cm) Weight: 76.1 kg (167 lb 12.8 oz) IBW/kg (Calculated) : 47.8 Heparin Dosing Weight: 64.8 kg  Vital Signs: Temp: 98.5 F (36.9 C) (09/20 0918) Temp Source: Oral (09/20 0918) BP: 146/68 (09/20 0918) Pulse Rate: 90 (09/20 0918)  Labs: Recent Labs    02/08/21 0454 02/09/21 0822 02/09/21 1855 02/10/21 0433  HGB 8.1* 8.3*  --  8.1*  HCT 24.2* 25.6*  --  25.1*  PLT 129* 144*  --  156  HEPARINUNFRC  --   --  0.25* 0.72*  CREATININE 1.21* 1.35*  --  1.30*    Estimated Creatinine Clearance: 59.6 mL/min (A) (by C-G formula based on SCr of 1.3 mg/dL (H)).   Medications:  Scheduled:   sodium chloride   Intravenous Once   Chlorhexidine Gluconate Cloth  6 each Topical Daily   cholecalciferol  1,000 Units Oral Daily   vitamin B-6  12.5 mg Oral BID   And   doxylamine (Sleep)  12.5 mg Oral BID   DULoxetine  20 mg Oral Daily   famotidine  20 mg Oral BID   feeding supplement (GLUCERNA SHAKE)  237 mL Oral BID BM   furosemide  40 mg Intravenous BID   hydrALAZINE  75 mg Oral Q8H   hydrocortisone  25 mg Oral Q24H   And   hydrocortisone  15 mg Oral Q24H   mouth rinse  15 mL Mouth Rinse q12n4p   metoCLOPramide (REGLAN) injection  5 mg Intravenous Q6H   pantoprazole  40 mg Oral Daily   potassium chloride  40 mEq Oral Once   potassium chloride  40 mEq Oral Daily   prenatal vitamin w/FE, FA  1 tablet Oral Q1200   sodium chloride  1 spray Each Nare Q2H   sodium chloride flush  10-40 mL Intracatheter Q12H   sucralfate  1 g Oral TID WC & HS   terbutaline  0.25 mg Subcutaneous Once   vitamin B-12  1,000 mcg Oral Daily   Infusions:   dextrose 25 mL/hr at 02/10/21 0053   heparin 1,450 Units/hr (02/10/21 0507)   insulin 2.4 Units/hr (02/10/21 1015)    Assessment: 30 YO female at [redacted]w[redacted]d  gestation on heparin drip for right brachial VTE, held 9/17-9/19 and restarted 9/19 at 12:42. Heparin level this AM is 0.72 units/ml. Platelets continue to improve at 156 this AM.   Goal of Therapy:  Heparin level 0.3-0.7 units/ml Monitor platelets by anticoagulation protocol: Yes   Plan:  Heparin level slightly above goal but will continue current rate of 1450 units/hr. Will check heparin level with AM labs Continue to monitor for bleeding, H/H, and platelets.  10/19 Jarvis Knodel 02/10/2021,10:31 AM

## 2021-02-10 NOTE — Progress Notes (Signed)
CSW acknowledged consult regarding patient's husband and daughter not being able to visit within the past 10 days. CSW spoke with patient's RN who reported that patient's husband visited yesterday and patient is in good spirits today. RN reported no current concerns or need for CSW intervention at this time. CSW encouraged RN to re-consult CSW if any needs/concerns arise.   Celso Sickle, LCSW Clinical Social Worker [facility] Cell#: [phone]

## 2021-02-10 NOTE — Progress Notes (Addendum)
FACULTY PRACTICE ANTEPARTUM PROGRESS NOTE  Ashley Camacho is a 30 y.o. G2P0101 at [redacted]w[redacted]d who is admitted for Type 1 DM complications and gastroparesis.  Estimated Date of Delivery: 03/30/21 Fetal presentation is cephalic.  Length of Stay:  56 Days. Admitted 12/16/2020  Subjective: Pt seen this morning, she is standing and states she feels well.  Per nursing BPP today was 8/8.  Discussed with patient actually delivery date and she would prefer 02/17/21 due to family needs.  She confirms desire for BTL.   Patient reports normal fetal movement.  She denies uterine contractions, denies bleeding and leaking of fluid per vagina.  No report of nausea/vomiting.  Vitals:  Blood pressure (!) 146/68, pulse 90, temperature 98.5 F (36.9 C), temperature source Oral, resp. rate 18, height 5\' 1"  (1.549 m), weight 76.1 kg, SpO2 96 %. Physical Examination: CONSTITUTIONAL: Well-developed,  female in no acute distress.  HENT:  Normocephalic, atraumatic, External right and left ear normal. Oropharynx is clear and moist EYES: Conjunctivae and EOM are normal. SKIN: Skin is warm and dry. No rash noted. Not diaphoretic. No erythema. No pallor. NEUROLGIC: Alert and oriented to person, place, and time. Normal reflexes, muscle tone coordination. No cranial nerve deficit noted. PSYCHIATRIC: Normal mood and affect. Normal behavior. Normal judgment and thought content. CARDIOVASCULAR: Normal heart rate noted, regular rhythm RESPIRATORY: Effort and breath sounds normal, no problems with respiration noted MUSCULOSKELETAL: Normal range of motion. No edema and no tenderness. ABDOMEN: Soft, nontender, nondistended, gravid. CERVIX: deferred  Fetal monitoring: BPP 8/8 per nursing, awaiting formal interpretation  Results for orders placed or performed during the hospital encounter of 12/16/20 (from the past 48 hour(s))  Glucose, capillary     Status: None   Collection Time: 02/09/21  5:23 AM  Result Value Ref Range    Glucose-Capillary 90 70 - 99 mg/dL    Comment: Glucose reference range applies only to samples taken after fasting for at least 8 hours.  CBC     Status: Abnormal   Collection Time: 02/09/21  8:22 AM  Result Value Ref Range   WBC 5.5 4.0 - 10.5 K/uL   RBC 2.70 (L) 3.87 - 5.11 MIL/uL   Hemoglobin 8.3 (L) 12.0 - 15.0 g/dL   HCT 02/11/21 (L) 33.2 - 95.1 %   MCV 94.8 80.0 - 100.0 fL   MCH 30.7 26.0 - 34.0 pg   MCHC 32.4 30.0 - 36.0 g/dL   RDW 88.4 (H) 16.6 - 06.3 %   Platelets 144 (L) 150 - 400 K/uL   nRBC 0.0 0.0 - 0.2 %    Comment: Performed at [facility], [address], [place], [place]  Comprehensive metabolic panel     Status: Abnormal   Collection Time: 02/09/21  8:22 AM  Result Value Ref Range   Sodium 135 135 - 145 mmol/L   Potassium 3.3 (L) 3.5 - 5.1 mmol/L   Chloride 98 98 - 111 mmol/L   CO2 27 22 - 32 mmol/L   Glucose, Bld 133 (H) 70 - 99 mg/dL    Comment: Glucose reference range applies only to samples taken after fasting for at least 8 hours.   BUN 24 (H) 6 - 20 mg/dL   Creatinine, Ser 02/11/21 (H) 0.44 - 1.00 mg/dL   Calcium 8.5 (L) 8.9 - 10.3 mg/dL   Total Protein 4.9 (L) 6.5 - 8.1 g/dL   Albumin 2.4 (L) 3.5 - 5.0 g/dL   AST 29 15 - 41 U/L  ALT 44 0 - 44 U/L   Alkaline Phosphatase 83 38 - 126 U/L   Total Bilirubin 0.9 0.3 - 1.2 mg/dL   GFR, Estimated 55 (L) >60 mL/min    Comment: (NOTE) Calculated using the CKD-EPI Creatinine Equation (2021)    Anion gap 10 5 - 15    Comment: Performed at [facility], [address] [place], [place] [address]  Heparin level (unfractionated)     Status: Abnormal   Collection Time: 02/09/21  6:55 PM  Result Value Ref Range   Heparin Unfractionated 0.25 (L) 0.30 - 0.70 IU/mL    Comment: (NOTE) The clinical reportable range upper limit is being lowered to >1.10 to align with the FDA approved guidance for the current laboratory assay.  If heparin results are below expected values, and patient dosage has   been confirmed, suggest follow up testing of antithrombin III levels. Performed at [facility], [address], [place], [place] [address]   CBC     Status: Abnormal   Collection Time: 02/10/21  4:33 AM  Result Value Ref Range   WBC 5.7 4.0 - 10.5 K/uL   RBC 2.65 (L) 3.87 - 5.11 MIL/uL   Hemoglobin 8.1 (L) 12.0 - 15.0 g/dL   HCT 37.1 (L) 06.2 - 69.4 %   MCV 94.7 80.0 - 100.0 fL   MCH 30.6 26.0 - 34.0 pg   MCHC 32.3 30.0 - 36.0 g/dL   RDW 85.4 (H) 62.7 - 03.5 %   Platelets 156 150 - 400 K/uL   nRBC 0.0 0.0 - 0.2 %    Comment: Performed at [facility], [address] [place], [place] [address]  Comprehensive metabolic panel     Status: Abnormal   Collection Time: 02/10/21  4:33 AM  Result Value Ref Range   Sodium 133 (L) 135 - 145 mmol/L   Potassium 3.3 (L) 3.5 - 5.1 mmol/L   Chloride 94 (L) 98 - 111 mmol/L   CO2 28 22 - 32 mmol/L   Glucose, Bld 357 (H) 70 - 99 mg/dL    Comment: Glucose reference range applies only to samples taken after fasting for at least 8 hours.   BUN 27 (H) 6 - 20 mg/dL   Creatinine, Ser 1.82 (H) 0.44 - 1.00 mg/dL   Calcium 8.3 (L) 8.9 - 10.3 mg/dL   Total Protein 4.8 (L) 6.5 - 8.1 g/dL   Albumin 2.4 (L) 3.5 - 5.0 g/dL   AST 29 15 - 41 U/L   ALT 39 0 - 44 U/L   Alkaline Phosphatase 78 38 - 126 U/L   Total Bilirubin 0.5 0.3 - 1.2 mg/dL   GFR, Estimated 57 (L) >60 mL/min    Comment: (NOTE) Calculated using the CKD-EPI Creatinine Equation (2021)    Anion gap 11 5 - 15    Comment: Performed at [facility], [address], [place], [place] [address]  Heparin level (unfractionated)     Status: Abnormal   Collection Time: 02/10/21  4:33 AM  Result Value Ref Range   Heparin Unfractionated 0.72 (H) 0.30 - 0.70 IU/mL    Comment: (NOTE) The clinical reportable range upper limit is being lowered to >1.10 to align with the FDA approved guidance for the current laboratory assay.  If heparin results are below expected values, and  patient dosage has  been confirmed, suggest follow up testing of antithrombin III levels. Performed at [facility], [address],  [place], [place] [address]   Glucose, capillary     Status: None   Collection Time: 02/10/21  5:17 AM  Result Value Ref Range   Glucose-Capillary 99 70 - 99 mg/dL    Comment: Glucose reference range applies only to samples taken after fasting for at least 8 hours.    I have reviewed the patient's current medications.  ASSESSMENT: Active Problems:   Delivery with history of C-section   Acute kidney injury (HCC)   Hyperemesis   DKA, type 1, not at goal [facility])   Acute deep vein thrombosis (DVT) of brachial vein of right upper extremity (HCC)   Vitreous hemorrhage of right eye (HCC)   Gastroparesis due to DM (HCC)   Diabetic retinopathy (HCC)   Acute on chronic combined systolic and diastolic CHF (congestive heart failure) (HCC)   Type 1 diabetes mellitus affecting pregnancy in second trimester, antepartum   HFrEF (heart failure with reduced ejection fraction) (HCC)   Anemia during pregnancy   Pre-existing type 1 diabetes mellitus during pregnancy in third trimester   History of preterm delivery, currently pregnant   Leg edema   Acute esophagitis   [redacted] weeks gestation of pregnancy   PLAN: BPP reassuring Repeat c/s at 34.1 week on 9/27 with BTL, pt will need stress dose steroids and hold lovenox for 12 hours prior to procedure Anemia still noted, for surgical purposes will transfuse 1 unit PRBC next Monday prior to surgery and will have cell saver available. Diabetes and medical management per IM/hospitalist Inpatient care until delivery   Continue routine antenatal care.   Mariel Aloe, MD [facility] Faculty Attending, [facility] 02/10/2021 10:19 AM

## 2021-02-10 NOTE — Progress Notes (Signed)
Inpatient Diabetes Program Recommendations  Diabetes Treatment Program Recommendations  ADA Standards of Care 2018 Diabetes in Pregnancy Target Glucose Ranges:  Fasting: 60 - 90 mg/dL Preprandial: 60 - 161 mg/dL 1 hr postprandial: Less than 140mg /dL (from first bite of meal) 2 hr postprandial: Less than 120 mg/dL (from first bite of meal)     Lab Results  Component Value Date   GLUCAP 99 02/10/2021   HGBA1C 5.8 (H) 11/21/2020    Review of Glycemic Control Results for Ashley Camacho, Ashley Camacho (MRN [name]) as of 02/10/2021 11:39  Ref. Range 02/07/2021 05:02 02/08/2021 04:50 02/09/2021 05:23 02/10/2021 05:17  Glucose-Capillary Latest Ref Range: 70 - 99 mg/dL 73 99 90 99   Diabetes history: DM 1 Outpatient Diabetes medications: Omnipod with Novolog insulin  Most recent insulin pump rates from [facility]: 0.05 units/hr= 1.2 units in 24 hours, 1 units drops blood sugar 100 mg/dL, 1 units per 30 grams of CHO   In DM coordinator note from 11/20/2020: "Current pump settings are: 12-6am  0.15 units/hour 6am-12am 0.25 units/hour Total 5.4 units/24 hours  CF 80 (1 unit drops glucose 80 mg/dl) I:C 11/22/2020 (1 unit covers 20 grams of carbs) Target BG 100 mg/dl"   Current orders for Inpatient glycemic control:  IV insulin Solucortef 25 mg QAM/15 mg QPM D10% @25  ml/hr   Inpatient Diabetes Program Recommendations:  Reapplied Freestyle Libre to right upper arm. Previous sensor from left upper arm removed. Patient reports improvement with intake. Denies nausea/vomiting. Continue with IV insulin.  Following.   Thanks, 8:11, MSN, RNC-OB Diabetes Coordinator [phone] (8a-5p)

## 2021-02-10 NOTE — Progress Notes (Signed)
Chart reviewed.  Hemodynamically stable.  No reports of bleeding any further.  Continue heparin.  Slight hypokalemia, will replace.  Continue Lasix.  Check magnesium.

## 2021-02-11 LAB — CBC
HCT: 25.4 % — ABNORMAL LOW (ref 36.0–46.0)
Hemoglobin: 8.3 g/dL — ABNORMAL LOW (ref 12.0–15.0)
MCH: 31.2 pg (ref 26.0–34.0)
MCHC: 32.7 g/dL (ref 30.0–36.0)
MCV: 95.5 fL (ref 80.0–100.0)
Platelets: 173 10*3/uL (ref 150–400)
RBC: 2.66 MIL/uL — ABNORMAL LOW (ref 3.87–5.11)
RDW: 16.4 % — ABNORMAL HIGH (ref 11.5–15.5)
WBC: 6.5 10*3/uL (ref 4.0–10.5)
nRBC: 0 % (ref 0.0–0.2)

## 2021-02-11 LAB — GLUCOSE, CAPILLARY: Glucose-Capillary: 88 mg/dL (ref 70–99)

## 2021-02-11 LAB — HEPARIN LEVEL (UNFRACTIONATED)
Heparin Unfractionated: 0.96 IU/mL — ABNORMAL HIGH (ref 0.30–0.70)
Heparin Unfractionated: 1.04 IU/mL — ABNORMAL HIGH (ref 0.30–0.70)
Heparin Unfractionated: 0.7 IU/mL (ref 0.30–0.70)

## 2021-02-11 LAB — COMPREHENSIVE METABOLIC PANEL
ALT: 48 U/L — ABNORMAL HIGH (ref 0–44)
AST: 43 U/L — ABNORMAL HIGH (ref 15–41)
Albumin: 2.5 g/dL — ABNORMAL LOW (ref 3.5–5.0)
Alkaline Phosphatase: 85 U/L (ref 38–126)
Anion gap: 9 (ref 5–15)
BUN: 32 mg/dL — ABNORMAL HIGH (ref 6–20)
CO2: 29 mmol/L (ref 22–32)
Calcium: 8.4 mg/dL — ABNORMAL LOW (ref 8.9–10.3)
Chloride: 98 mmol/L (ref 98–111)
Creatinine, Ser: 1.48 mg/dL — ABNORMAL HIGH (ref 0.44–1.00)
GFR, Estimated: 49 mL/min — ABNORMAL LOW (ref >60)
Glucose, Bld: 165 mg/dL — ABNORMAL HIGH (ref 70–99)
Potassium: 3.8 mmol/L (ref 3.5–5.1)
Sodium: 136 mmol/L (ref 135–145)
Total Bilirubin: 0.6 mg/dL (ref 0.3–1.2)
Total Protein: 5.1 g/dL — ABNORMAL LOW (ref 6.5–8.1)

## 2021-02-11 MED ORDER — HEPARIN (PORCINE) 25000 UT/250ML-% IV SOLN
1000.0000 [IU]/h | INTRAVENOUS | Status: DC
  Administered 2021-02-12: 1100 [IU]/h via INTRAVENOUS
  Administered 2021-02-13 – 2021-02-16 (×5): 1000 [IU]/h via INTRAVENOUS
  Filled 2021-02-11 (×8): qty 250

## 2021-02-11 NOTE — Progress Notes (Signed)
ANTICOAGULATION CONSULT NOTE - Follow Up Consult  Pharmacy Consult for heparin Indication: right brachial VTE in pregnancy  No Known Allergies  Patient Measurements: Height: 5\' 1"  (154.9 cm) Weight: 76.1 kg (167 lb 12.8 oz) IBW/kg (Calculated) : 47.8 kg   Vital Signs: Temp: 98.5 F (36.9 C) (09/21 0809) Temp Source: Oral (09/21 0809) BP: 148/62 (09/21 0809) Pulse Rate: 90 (09/21 0809)  Labs: Recent Labs    02/09/21 0822 02/09/21 1855 02/10/21 0433 02/11/21 0420  HGB 8.3*  --  8.1* 8.3*  HCT 25.6*  --  25.1* 25.4*  PLT 144*  --  156 173  HEPARINUNFRC  --  0.25* 0.72* 0.96*  CREATININE 1.35*  --  1.30*  --     Estimated Creatinine Clearance: 59.6 mL/min (A) (by C-G formula based on SCr of 1.3 mg/dL (H)).   Medications:  Scheduled:   sodium chloride   Intravenous Once   Chlorhexidine Gluconate Cloth  6 each Topical Daily   cholecalciferol  1,000 Units Oral Daily   vitamin B-6  12.5 mg Oral BID   And   doxylamine (Sleep)  12.5 mg Oral BID   DULoxetine  20 mg Oral Daily   famotidine  20 mg Oral BID   feeding supplement (GLUCERNA SHAKE)  237 mL Oral BID BM   furosemide  40 mg Intravenous BID   hydrALAZINE  75 mg Oral Q8H   hydrocortisone  25 mg Oral Q24H   And   hydrocortisone  15 mg Oral Q24H   mouth rinse  15 mL Mouth Rinse q12n4p   metoCLOPramide (REGLAN) injection  5 mg Intravenous Q6H   pantoprazole  40 mg Oral Daily   potassium chloride  40 mEq Oral Daily   prenatal vitamin w/FE, FA  1 tablet Oral Q1200   sodium chloride  1 spray Each Nare Q2H   sodium chloride flush  10-40 mL Intracatheter Q12H   sucralfate  1 g Oral TID WC & HS   terbutaline  0.25 mg Subcutaneous Once   vitamin B-12  1,000 mcg Oral Daily   Infusions:   dextrose 25 mL/hr at 02/10/21 0053   heparin 1,300 Units/hr (02/11/21 0756)   insulin 2.8 Units/hr (02/11/21 1032)    Assessment: 30 yo female [redacted]w[redacted]d gestation on heparin drip for right brachial VTE.  Heparin level this am was  0.96.  H/H and plt ct are stable.  No bleeding noted.  Goal of Therapy:  Heparin level 0.3-0.7 units/ml Monitor platelets by anticoagulation protocol: Yes   Plan:  Decrease heparin drip by ~ 2units/kg to 1300 units/hr. Check anti-Xa level in 6 hours and daily while on heparin Continue to monitor H&H and platelets  [redacted]w[redacted]d 02/11/2021,11:00 AM

## 2021-02-11 NOTE — Progress Notes (Signed)
ANTICOAGULATION CONSULT NOTE - Follow Up Consult  Pharmacy Consult for heparin Indication:  right brachial VTE in pregnancy   No Known Allergies  Patient Measurements: Height: 5\' 1"  (154.9 cm) Weight: 76.1 kg (167 lb 12.8 oz) IBW/kg (Calculated) : 47.8 Heparin Dosing Weight: 64.8 kg  Vital Signs: Temp: 98.2 F (36.8 C) (09/21 1940) Temp Source: Oral (09/21 1940) BP: 125/61 (09/21 1940) Pulse Rate: 89 (09/21 1940)  Labs: Recent Labs    02/09/21 0822 02/09/21 1855 02/10/21 0433 02/11/21 0420 02/11/21 1112 02/11/21 1400 02/11/21 1953  HGB 8.3*  --  8.1* 8.3*  --   --   --   HCT 25.6*  --  25.1* 25.4*  --   --   --   PLT 144*  --  156 173  --   --   --   HEPARINUNFRC  --    < > 0.72* 0.96*  --  1.04* 0.70  CREATININE 1.35*  --  1.30*  --  1.48*  --   --    < > = values in this interval not displayed.    Estimated Creatinine Clearance: 52.3 mL/min (A) (by C-G formula based on SCr of 1.48 mg/dL (H)).   Medications:  Scheduled:   sodium chloride   Intravenous Once   Chlorhexidine Gluconate Cloth  6 each Topical Daily   cholecalciferol  1,000 Units Oral Daily   vitamin B-6  12.5 mg Oral BID   And   doxylamine (Sleep)  12.5 mg Oral BID   DULoxetine  20 mg Oral Daily   famotidine  20 mg Oral BID   feeding supplement (GLUCERNA SHAKE)  237 mL Oral BID BM   furosemide  40 mg Intravenous BID   hydrALAZINE  75 mg Oral Q8H   hydrocortisone  25 mg Oral Q24H   And   hydrocortisone  15 mg Oral Q24H   mouth rinse  15 mL Mouth Rinse q12n4p   metoCLOPramide (REGLAN) injection  5 mg Intravenous Q6H   pantoprazole  40 mg Oral Daily   potassium chloride  40 mEq Oral Daily   prenatal vitamin w/FE, FA  1 tablet Oral Q1200   sodium chloride  1 spray Each Nare Q2H   sodium chloride flush  10-40 mL Intracatheter Q12H   sucralfate  1 g Oral TID WC & HS   terbutaline  0.25 mg Subcutaneous Once   vitamin B-12  1,000 mcg Oral Daily   Infusions:   dextrose 25 mL/hr at 02/11/21 1407    heparin 1,150 Units/hr (02/11/21 1600)   insulin 5 Units/hr (02/11/21 1903)    Assessment: 30 yo female [redacted]w[redacted]d gestation on heparin drip for right brachial VTE. Heparin was held following supratherapeutic level this afternoon and restarted at 1600 today at lower rate of 1150 units/hr. Approximate 6 hours level after heparin was restarted is therapeutic at 0.7 units/ml. H/H and platelets stabel with no s/sx of bleeding.   Goal of Therapy:  Heparin level 0.3-0.7 units/ml Monitor platelets by anticoagulation protocol: Yes   Plan:  Continue heparin drip at 1150 units/hr. Monitor daily heparin levels. Continue to monitor H&H and platelets  [redacted]w[redacted]d 02/11/2021,10:42 PM

## 2021-02-11 NOTE — Progress Notes (Signed)
ANTICOAGULATION CONSULT NOTE - Follow Up Consult  Pharmacy Consult for heparin Indication: right brachial VTE in pregnancy  No Known Allergies  Patient Measurements: Height: 5\' 1"  (154.9 cm) Weight: 76.1 kg (167 lb 12.8 oz) IBW/kg (Calculated) : 47.8 kg   Vital Signs: Temp: 97.6 F (36.4 C) (09/21 1140) Temp Source: Oral (09/21 1140) BP: 139/56 (09/21 1140) Pulse Rate: 93 (09/21 1140)  Labs: Recent Labs    02/09/21 0822 02/09/21 1855 02/10/21 0433 02/11/21 0420 02/11/21 1112 02/11/21 1400  HGB 8.3*  --  8.1* 8.3*  --   --   HCT 25.6*  --  25.1* 25.4*  --   --   PLT 144*  --  156 173  --   --   HEPARINUNFRC  --    < > 0.72* 0.96*  --  1.04*  CREATININE 1.35*  --  1.30*  --  1.48*  --    < > = values in this interval not displayed.     Estimated Creatinine Clearance: 52.3 mL/min (A) (by C-G formula based on SCr of 1.48 mg/dL (H)).   Medications:  Scheduled:   sodium chloride   Intravenous Once   Chlorhexidine Gluconate Cloth  6 each Topical Daily   cholecalciferol  1,000 Units Oral Daily   vitamin B-6  12.5 mg Oral BID   And   doxylamine (Sleep)  12.5 mg Oral BID   DULoxetine  20 mg Oral Daily   famotidine  20 mg Oral BID   feeding supplement (GLUCERNA SHAKE)  237 mL Oral BID BM   furosemide  40 mg Intravenous BID   hydrALAZINE  75 mg Oral Q8H   hydrocortisone  25 mg Oral Q24H   And   hydrocortisone  15 mg Oral Q24H   mouth rinse  15 mL Mouth Rinse q12n4p   metoCLOPramide (REGLAN) injection  5 mg Intravenous Q6H   pantoprazole  40 mg Oral Daily   potassium chloride  40 mEq Oral Daily   prenatal vitamin w/FE, FA  1 tablet Oral Q1200   sodium chloride  1 spray Each Nare Q2H   sodium chloride flush  10-40 mL Intracatheter Q12H   sucralfate  1 g Oral TID WC & HS   terbutaline  0.25 mg Subcutaneous Once   vitamin B-12  1,000 mcg Oral Daily   Infusions:   dextrose 25 mL/hr at 02/11/21 1407   heparin 1,300 Units/hr (02/11/21 1348)   insulin 2.6 Units/hr  (02/11/21 1334)    Assessment: 30 yo female [redacted]w[redacted]d gestation on heparin drip for right brachial VTE.  Heparin level this am was 0.96.  H/H and plt ct are stable.  No bleeding noted. Heparin drip rate was reduced by 2 units/kg.  The heparin level 6 hours after the rate change was 1.04.  Confirmed with the RN that the level was drawn correctly.  Goal of Therapy:  Heparin level 0.3-0.7 units/ml Monitor platelets by anticoagulation protocol: Yes   Plan:  Hold heparin drip x1 hour then restart heparin at 1150 units/hr. Check anti-Xa level in 6 hours and daily while on heparin Continue to monitor H&H and platelets  [redacted]w[redacted]d 02/11/2021,2:47 PM

## 2021-02-11 NOTE — Progress Notes (Signed)
PROGRESS NOTE  Ashley Camacho XAJ:[id number] DOB: 06-Nov-1990 DOA: 12/16/2020 PCP: Hildred Laser, MD  HPI/Recap of past 24 hours: Ashley Camacho is a 30 y.o. G2P0101 with a history of T1DM, chronic HFrEF who was admitted initially 6/30 to [facility] for nausea and vomiting subsequently transferred to [facility] for rhinovirus pneumonia, discharged 7/20 only to return 7/22 with starvation ketosis and brachial vein DVT. TPN thru PICC was started and ultimately the patient was transferred to [facility] for cortrack placement on 7/26. Further complications detailed below:  7/27 cardiology consulted for cardiomyopathy, PICC line inserted, tube feeding started. 7/29 having recurrent retention, Foley catheter inserted 7/30 PRBC transfused 8/5 PRBC transfused 8/15 UCX pansensitive Enterococcus faecalis. 8/16 cardiology signed off, no follow-up recommended 8/19 nephrology consulted, no follow-up recommended 8/20 PRBC transfused 8/21 rescue course of betamethasone 8/22 Foley catheter reinserted after removal due to retention. 8/23 GI consulted. MFM consulted, nephrology signed off. 8/24 underwent EGD shows severe esophagitis with mild oozing.   Dobbhoff removed patient on oral diet. 8/25 PICC line removed. 8/26 started on steroids for adrenal insufficiency. 8/27 2 episodes of hematemesis.  Started on PPI drip.  GI reconsulted.  Lovenox held. 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 8/30 PICC line reinserted due to poor IV access, difficulty with blood draws and need for IV heparin. 9/9 Hematuria, IV Lasix stopped and started on p.o. amoxicillin 500 mg twice daily due to concern for hemorrhagic cystitis.  Augmentin was stopped on 02/01/21  9/13: Still with hematuria.  Restarted Iv lasix 20 mg daily x 3 days, Kcl po 20 meq x 3 days.  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 Orvan Falconer.  IV Protonix switched to oral on 01/28/2021.  Continue Pepcid, Protonix and Carafate No plan to repeat endoscopic evaluation at this time.  Gross hematuria, unclear etiology Initial concern for hemorrhagic cystitis but repeat urine cx was negative.  Augmentin started on 9/9 was Dced on 9/11 by obgyn Renal ultrasound obtained as discussed below.  I personally saw the urine a few days ago.  It was dark but I do not think it had any hematuria.  Patient tells me that her urine is now clear.  Nonetheless, patient told me that she has been told by her OB physician that they have discussed her case with urology and since she is pregnant, they cannot do any cystoscopy at this point in time anyways.  We will continue to monitor her hemoglobin which has remained stable since last several days now.  Acute urinary retention/bilateral hydronephrosis Retention improved.  Voiding spontaneously.  Renal ultrasound showed right kidney moderate hydronephrosis,  slightly improved compared to recent prior examination, left kidney mild hydronephrosis which is new.  We will repeat ultrasound on 02/12/2021.  Nausea and vomiting/hyperemesis/gastroparesis in the setting of pregnancy Management per  OB. Decreased insulin rate on 9/11 to avoid hypoglycemia with poor oral intake.  According to patient, she has no more nausea and her oral intake is improved and she is consuming about 100% of her lunch and dinner.  Remains on insulin drip. Closely monitor for hypoglycemia  Elevated liver chemistries, downtrending Normal T bilirubin.  Does not appear to be hemolyzing. Continue to closely monitor intermittently.  Acute blood loss anemia in the setting of hematuria Hemoglobin is stable between 7 and 8.  Transfuse if drops less than 7.  Recurrent Hypoglycemia, due to poor oral intake, nausea with vomiting. Continue insulin drip and D10. Continue to encourage increase in oral intake. Antiemetics as needed  Worsening Anasarca due to severe malnutrition related to hyperemesis as above as well as acute on chronic HFrEF, HTN:  Updated echo showed improved LVEF 45% now up to 50-55%. RV and pulmonary artery pressures wnl. Small pericardial effusion noted without tamponade. Off Lasix since yesterday.  Patient tells me that her edema has worsened and she is requesting to be placed back on Lasix.  On exam, she does have +3 pitting edema all the way up to the waist.  We will continue 40 mg IV Lasix twice daily daily until Friday and reassess.  Chronic insomnia She is currently on Unisom 12.5 mg twice daily  Acute thrombocytopenia Improving.  Monitor closely.  Adrenal insufficiency: ACTH stim test diagnostic.  Continue Cortef 25 mg in the morning and 15 mg at 5 PM.  Will need to stress dose around her C-section at 100 mg IV every 8 hours for 1 day followed by 50 mg every 8 hours for 1 day followed by 50 mg twice daily for 1 day and then back to her current dose.   Right brachial vein DVT: Dx 12/07/20.  Seems to be recannulating based on repeat U/S.  Continue heparin.  No signs of bleeding.   T1DM with gastroparesis and peripheral neuropathy: Brittle control due to sensitivity and inconsistent  calorie inputs. HbA1c was 5.8% 11/21/2020 (impacted by mid-late pregnancy).  She is on scheduled IV Reglan We will keep on insulin drip and D10 W as she is approaching her delivery date. Appreciate diabetes coordinator's assistance. Avoid hypoglycemia.  Hypokalemia: Resolved.  Hypomagnesemia: Resolved   HTN: Fairly controlled, continue to closely monitor. Continue p.o. hydralazine 75mg  q8h.  Continue to monitor for preeclampsia.    Resolved nonoliguric AKI, prerenal in the setting of dehydration from poor oral intake, intake is now improved:  Creatinine 1.05 from 1.11 from 1.16.  GFR greater than 60. Avoid nephrotoxic agents Monitor back on diuretic Continue to monitor urine output  IUP:  - MFM recommends rLTCS at 34 weeks (9/20). Continue NSTs/BPP/serial growth scans. Leaving orders in this regard to MFM/OB.  NST on 9/8 was reactive, reassuring.  - Has received BMZ - Procardia given 9/5 and on 01/29/2021 for contractions.  - OB co-managing, appreciate OB/GYN's assistance.   Treated Enterococcus faecalis UTI: Urine culture collected from 01/05/2021, pansensitive, treated with amoxicillin 8/16 - 8/22.   Limited venous access:  - PICC reinserted 8/30.  Epistaxis/vaginal bleeding: Due to reports of epistaxis on 02/07/2021, heparin drip was stopped.  She also had spotting of vaginal bleeding on 02/08/2021.  Heparin was resumed on 02/09/2021 after 48 hours of holiday, according to patient, no  further bleeding.    DVT prophylaxis: Heparin gtt Code Status: Full Family Communication: None at bedside Disposition Plan:  Status is: Inpatient   Remains inpatient appropriate because:IV treatments appropriate due to intensity of illness or inability to take PO   Dispo: The patient is from: Home              Anticipated d/c is to: Home with OB/GYN signs off.              Patient currently is not medically stable to d/c.   Consultants:  OB/GYN Nephrology Cardiology GI  Subjective: Seen  and examined.  No complaints.  Objective: Vitals:   02/10/21 2359 02/11/21 0000 02/11/21 0341 02/11/21 0809  BP: (!) 133/58  135/78 (!) 148/62  Pulse: 87  86 90  Resp: 18  16 16   Temp: 98.1 F (36.7 C)  98.4 F (36.9 C) 98.5 F (36.9 C)  TempSrc: Oral  Oral Oral  SpO2: 98% 98% 98% 96%  Weight:      Height:        Intake/Output Summary (Last 24 hours) at 02/11/2021 1110 Last data filed at 02/11/2021 0900 Gross per 24 hour  Intake 1622.69 ml  Output 3300 ml  Net -1677.31 ml    Filed Weights   01/24/21 0603 01/26/21 0530 02/08/21 1157  Weight: 81.7 kg 80.6 kg 76.1 kg    Exam: General exam: Appears calm and comfortable  Respiratory system: Clear to auscultation. Respiratory effort normal. Cardiovascular system: S1 & S2 heard, RRR. No JVD, murmurs, rubs, gallops or clicks.  +2 pitting edema bilateral lower extremity. Gastrointestinal system: Abdomen is nondistended, soft and nontender. No organomegaly or masses felt. Normal bowel sounds heard. Central nervous system: Alert and oriented. No focal neurological deficits. Extremities: Symmetric 5 x 5 power. Skin: No rashes, lesions or ulcers.  Psychiatry: Judgement and insight appear normal. Mood & affect appropriate.   Data Reviewed: CBC: Recent Labs  Lab 02/06/21 0423 02/08/21 0454 02/09/21 0822 02/10/21 0433 02/11/21 0420  WBC 5.1 5.1 5.5 5.7 6.5  HGB 7.9* 8.1* 8.3* 8.1* 8.3*  HCT 24.1* 24.2* 25.6* 25.1* 25.4*  MCV 95.6 93.8 94.8 94.7 95.5  PLT 108* 129* 144* 156 173    Basic Metabolic Panel: Recent Labs  Lab 02/05/21 0512 02/06/21 0423 02/08/21 0454 02/09/21 0822 02/10/21 0433 02/10/21 1049  NA 138 136 135 135 133*  --   K 3.9 3.8 3.6 3.3* 3.3*  --   CL 104 101 100 98 94*  --   CO2 27 26 27 27 28   --   GLUCOSE 115* 99 95 133* 357*  --   BUN 19 21* 22* 24* 27*  --   CREATININE 1.13* 1.17* 1.21* 1.35* 1.30*  --   CALCIUM 8.3* 8.7* 8.4* 8.5* 8.3*  --   MG  --   --   --   --   --  1.7     GFR: Estimated Creatinine Clearance: 59.6 mL/min (A) (by C-G formula based on SCr of 1.3 mg/dL (H)). Liver Function Tests: Recent Labs  Lab 02/06/21 0931 02/08/21 0454 02/09/21 0822 02/10/21 0433 02/10/21 1049  AST 30 34 29 29 34  ALT 50* 48* 44 39 42  ALKPHOS 76 81 83 78 78  BILITOT 0.6 1.0 0.9 0.5 0.6  PROT 4.4* 4.8* 4.9* 4.8* 5.2*  ALBUMIN 2.1* 2.2* 2.4* 2.4* 2.5*    No results for input(s): LIPASE, AMYLASE in the last 168 hours. No results for input(s): AMMONIA in the  last 168 hours. Coagulation Profile: No results for input(s): INR, PROTIME in the last 168 hours. Cardiac Enzymes: No results for input(s): CKTOTAL, CKMB, CKMBINDEX, TROPONINI in the last 168 hours. BNP (last 3 results) No results for input(s): PROBNP in the last 8760 hours. HbA1C: No results for input(s): HGBA1C in the last 72 hours. CBG: Recent Labs  Lab 02/07/21 0502 02/08/21 0450 02/09/21 0523 02/10/21 0517 02/11/21 0417  GLUCAP 73 99 90 99 88    Lipid Profile: No results for input(s): CHOL, HDL, LDLCALC, TRIG, CHOLHDL, LDLDIRECT in the last 72 hours. Thyroid Function Tests: No results for input(s): TSH, T4TOTAL, FREET4, T3FREE, THYROIDAB in the last 72 hours. Anemia Panel: No results for input(s): VITAMINB12, FOLATE, FERRITIN, TIBC, IRON, RETICCTPCT in the last 72 hours.  Urine analysis:    Component Value Date/Time   COLORURINE AMBER (A) 01/10/2021 0131   APPEARANCEUR CLOUDY (A) 01/10/2021 0131   APPEARANCEUR Clear 09/18/2020 1153   LABSPEC 1.017 01/10/2021 0131   LABSPEC 1.026 03/01/2013 1844   PHURINE 5.0 01/10/2021 0131   GLUCOSEU NEGATIVE 01/10/2021 0131   GLUCOSEU >=500 03/01/2013 1844   HGBUR NEGATIVE 01/10/2021 0131   BILIRUBINUR NEGATIVE 01/10/2021 0131   BILIRUBINUR neg 10/23/2020 0953   BILIRUBINUR Negative 09/18/2020 1153   BILIRUBINUR Negative 03/01/2013 1844   KETONESUR NEGATIVE 01/10/2021 0131   PROTEINUR NEGATIVE 01/10/2021 0131   UROBILINOGEN 0.2 10/23/2020  0953   NITRITE NEGATIVE 01/10/2021 0131   LEUKOCYTESUR SMALL (A) 01/10/2021 0131   LEUKOCYTESUR Negative 03/01/2013 1844   Sepsis Labs: @LABRCNTIP (procalcitonin:4,lacticidven:4)  ) No results found for this or any previous visit (from the past 240 hour(s)).     Studies: No results found.  Scheduled Meds:  sodium chloride   Intravenous Once   Chlorhexidine Gluconate Cloth  6 each Topical Daily   cholecalciferol  1,000 Units Oral Daily   vitamin B-6  12.5 mg Oral BID   And   doxylamine (Sleep)  12.5 mg Oral BID   DULoxetine  20 mg Oral Daily   famotidine  20 mg Oral BID   feeding supplement (GLUCERNA SHAKE)  237 mL Oral BID BM   furosemide  40 mg Intravenous BID   hydrALAZINE  75 mg Oral Q8H   hydrocortisone  25 mg Oral Q24H   And   hydrocortisone  15 mg Oral Q24H   mouth rinse  15 mL Mouth Rinse q12n4p   metoCLOPramide (REGLAN) injection  5 mg Intravenous Q6H   pantoprazole  40 mg Oral Daily   potassium chloride  40 mEq Oral Daily   prenatal vitamin w/FE, FA  1 tablet Oral Q1200   sodium chloride  1 spray Each Nare Q2H   sodium chloride flush  10-40 mL Intracatheter Q12H   sucralfate  1 g Oral TID WC & HS   terbutaline  0.25 mg Subcutaneous Once   vitamin B-12  1,000 mcg Oral Daily    Continuous Infusions:  dextrose 25 mL/hr at 02/10/21 0053   heparin 1,300 Units/hr (02/11/21 0756)   insulin 2.8 Units/hr (02/11/21 1032)     LOS: 57 days     02/13/21, MD [facility]  If 7PM-7AM, please contact night-coverage [email or web address] 02/11/2021, 11:10 AM

## 2021-02-11 NOTE — Progress Notes (Signed)
FACULTY PRACTICE ANTEPARTUM PROGRESS NOTE  Ashley Camacho is a 30 y.o. G2P0101 at [redacted]w[redacted]d who is admitted for management of type 1 DM, gastroparesis and their complications.  Estimated Date of Delivery: 03/30/21 Fetal presentation is cephalic.  Length of Stay:  57 Days. Admitted 12/16/2020  Subjective: Pt seen, doing well.  Pt still is taking po nutrition.  She denies any nausea at this time. Patient reports normal fetal movement.  She denies uterine contractions, denies bleeding and leaking of fluid per vagina.  Vitals:  Blood pressure 140/64, pulse 94, temperature 97.6 F (36.4 C), temperature source Oral, resp. rate 17, height 5\' 1"  (1.549 m), weight 76.1 kg, SpO2 98 %. Physical Examination: CONSTITUTIONAL: Well-developed, well-nourished female in no acute distress.  HENT:  Normocephalic, atraumatic, External right and left ear normal. Oropharynx is clear and moist EYES: Conjunctivae and EOM are normal.  NECK: Normal range of motion, supple, no masses. SKIN: Skin is warm and dry. No rash noted. Not diaphoretic. No erythema. No pallor. NEUROLGIC: Alert and oriented to person, place, and time. Normal reflexes, muscle tone coordination. No cranial nerve deficit noted. PSYCHIATRIC: Normal mood and affect. Normal behavior. Normal judgment and thought content. CARDIOVASCULAR: Normal heart rate noted, regular rhythm RESPIRATORY: Effort and breath sounds normal, no problems with respiration noted MUSCULOSKELETAL: Normal range of motion. Trace- 1+ edema and no tenderness. ABDOMEN: Soft, nontender, nondistended, gravid. CERVIX: deferred  Fetal monitoring: FHR: 140s bpm, Variability: moderate, Accelerations: Present, Decelerations: Absent  Uterine activity: irritability   Results for orders placed or performed during the hospital encounter of 12/16/20 (from the past 48 hour(s))  Heparin level (unfractionated)     Status: Abnormal   Collection Time: 02/09/21  6:55 PM  Result Value Ref Range    Heparin Unfractionated 0.25 (L) 0.30 - 0.70 IU/mL    Comment: (NOTE) The clinical reportable range upper limit is being lowered to >1.10 to align with the FDA approved guidance for the current laboratory assay.  If heparin results are below expected values, and patient dosage has  been confirmed, suggest follow up testing of antithrombin III levels. Performed at [facility], [address] [place], [place]   CBC     Status: Abnormal   Collection Time: 02/10/21  4:33 AM  Result Value Ref Range   WBC 5.7 4.0 - 10.5 K/uL   RBC 2.65 (L) 3.87 - 5.11 MIL/uL   Hemoglobin 8.1 (L) 12.0 - 15.0 g/dL   HCT 02/12/21 (L) 07.6 - 22.6 %   MCV 94.7 80.0 - 100.0 fL   MCH 30.6 26.0 - 34.0 pg   MCHC 32.3 30.0 - 36.0 g/dL   RDW 33.3 (H) 54.5 - 62.5 %   Platelets 156 150 - 400 K/uL   nRBC 0.0 0.0 - 0.2 %    Comment: Performed at [facility], [address] [place], [place]  Comprehensive metabolic panel     Status: Abnormal   Collection Time: 02/10/21  4:33 AM  Result Value Ref Range   Sodium 133 (L) 135 - 145 mmol/L   Potassium 3.3 (L) 3.5 - 5.1 mmol/L   Chloride 94 (L) 98 - 111 mmol/L   CO2 28 22 - 32 mmol/L   Glucose, Bld 357 (H) 70 - 99 mg/dL    Comment: Glucose reference range applies only to samples taken after fasting for at least 8 hours.   BUN 27 (H) 6 - 20 mg/dL   Creatinine, Ser 02/12/21 (H) 0.44 -  1.00 mg/dL   Calcium 8.3 (L) 8.9 - 10.3 mg/dL   Total Protein 4.8 (L) 6.5 - 8.1 g/dL   Albumin 2.4 (L) 3.5 - 5.0 g/dL   AST 29 15 - 41 U/L   ALT 39 0 - 44 U/L   Alkaline Phosphatase 78 38 - 126 U/L   Total Bilirubin 0.5 0.3 - 1.2 mg/dL   GFR, Estimated 57 (L) >60 mL/min    Comment: (NOTE) Calculated using the CKD-EPI Creatinine Equation (2021)    Anion gap 11 5 - 15    Comment: Performed at [facility], [address], [place], [place] [address]  Heparin level (unfractionated)     Status: Abnormal   Collection Time: 02/10/21  4:33 AM  Result Value  Ref Range   Heparin Unfractionated 0.72 (H) 0.30 - 0.70 IU/mL    Comment: (NOTE) The clinical reportable range upper limit is being lowered to >1.10 to align with the FDA approved guidance for the current laboratory assay.  If heparin results are below expected values, and patient dosage has  been confirmed, suggest follow up testing of antithrombin III levels. Performed at [facility], [address], [place], [place] [address]   Glucose, capillary     Status: None   Collection Time: 02/10/21  5:17 AM  Result Value Ref Range   Glucose-Capillary 99 70 - 99 mg/dL    Comment: Glucose reference range applies only to samples taken after fasting for at least 8 hours.  Type and screen [facility] Draw Type and Screen with other 5:00am labs     Status: None   Collection Time: 02/10/21 10:48 AM  Result Value Ref Range   ABO/RH(D) A POS    Antibody Screen NEG    Sample Expiration      02/13/2021,2359 Performed at [facility], [address] [place], [place] [address]   Hepatic function panel     Status: Abnormal   Collection Time: 02/10/21 10:49 AM  Result Value Ref Range   Total Protein 5.2 (L) 6.5 - 8.1 g/dL   Albumin 2.5 (L) 3.5 - 5.0 g/dL   AST 34 15 - 41 U/L   ALT 42 0 - 44 U/L   Alkaline Phosphatase 78 38 - 126 U/L   Total Bilirubin 0.6 0.3 - 1.2 mg/dL   Bilirubin, Direct 0.1 0.0 - 0.2 mg/dL   Indirect Bilirubin 0.5 0.3 - 0.9 mg/dL    Comment: Performed at [facility], [address] [place], [place] [address]  Magnesium     Status: None   Collection Time: 02/10/21 10:49 AM  Result Value Ref Range   Magnesium 1.7 1.7 - 2.4 mg/dL    Comment: Performed at [facility], [address], [place], [place] [address]  Glucose, capillary     Status: None   Collection Time: 02/11/21  4:17 AM  Result Value Ref Range   Glucose-Capillary 88 70 - 99 mg/dL    Comment: Glucose reference range applies only to samples taken after fasting for at  least 8 hours.  CBC     Status: Abnormal   Collection Time: 02/11/21  4:20 AM  Result Value Ref Range   WBC 6.5 4.0 - 10.5 K/uL   RBC 2.66 (L) 3.87 - 5.11 MIL/uL   Hemoglobin 8.3 (L) 12.0 - 15.0 g/dL   HCT 71.2 (L) 45.8 - 09.9 %   MCV 95.5 80.0 - 100.0 fL   MCH  31.2 26.0 - 34.0 pg   MCHC 32.7 30.0 - 36.0 g/dL   RDW 01.0 (H) 93.2 - 35.5 %   Platelets 173 150 - 400 K/uL   nRBC 0.0 0.0 - 0.2 %    Comment: Performed at [facility], [address] [place], [place] [address]  Heparin level (unfractionated)     Status: Abnormal   Collection Time: 02/11/21  4:20 AM  Result Value Ref Range   Heparin Unfractionated 0.96 (H) 0.30 - 0.70 IU/mL    Comment: (NOTE) The clinical reportable range upper limit is being lowered to >1.10 to align with the FDA approved guidance for the current laboratory assay.  If heparin results are below expected values, and patient dosage has  been confirmed, suggest follow up testing of antithrombin III levels. Performed at [facility], [address] [place], [place] [address]   Comprehensive metabolic panel     Status: Abnormal   Collection Time: 02/11/21 11:12 AM  Result Value Ref Range   Sodium 136 135 - 145 mmol/L   Potassium 3.8 3.5 - 5.1 mmol/L   Chloride 98 98 - 111 mmol/L   CO2 29 22 - 32 mmol/L   Glucose, Bld 165 (H) 70 - 99 mg/dL    Comment: Glucose reference range applies only to samples taken after fasting for at least 8 hours.   BUN 32 (H) 6 - 20 mg/dL   Creatinine, Ser 0.62 (H) 0.44 - 1.00 mg/dL   Calcium 8.4 (L) 8.9 - 10.3 mg/dL   Total Protein 5.1 (L) 6.5 - 8.1 g/dL   Albumin 2.5 (L) 3.5 - 5.0 g/dL   AST 43 (H) 15 - 41 U/L   ALT 48 (H) 0 - 44 U/L   Alkaline Phosphatase 85 38 - 126 U/L   Total Bilirubin 0.6 0.3 - 1.2 mg/dL   GFR, Estimated 49 (L) >60 mL/min    Comment: (NOTE) Calculated using the CKD-EPI Creatinine Equation (2021)    Anion gap 9 5 - 15    Comment: Performed at [facility], [address], [place], [place] [address]  Heparin level (unfractionated)     Status: Abnormal   Collection Time: 02/11/21  2:00 PM  Result Value Ref Range   Heparin Unfractionated 1.04 (H) 0.30 - 0.70 IU/mL    Comment: (NOTE) The clinical reportable range upper limit is being lowered to >1.10 to align with the FDA approved guidance for the current laboratory assay.  If heparin results are below expected values, and patient dosage has  been confirmed, suggest follow up testing of antithrombin III levels. Performed at [facility], [address], [place], [place] [address]     I have reviewed the patient's current medications.  ASSESSMENT: Active Problems:   Delivery with history of C-section   Acute kidney injury (HCC)   Hyperemesis   DKA, type 1, not at goal [facility])   Acute deep vein thrombosis (DVT) of brachial vein of right upper extremity (HCC)   Vitreous hemorrhage of right eye (HCC)   Gastroparesis due to DM (HCC)   Diabetic retinopathy (HCC)   Acute on chronic combined systolic and diastolic CHF (congestive heart failure) (HCC)   Type 1 diabetes mellitus affecting pregnancy in second trimester, antepartum   HFrEF (heart failure with reduced ejection fraction) (HCC)   Anemia during pregnancy   Pre-existing type 1 diabetes mellitus during pregnancy in third trimester   History of preterm delivery, currently pregnant  Leg edema   Acute esophagitis   [redacted] weeks gestation of pregnancy   PLAN: BPP reactive Repeat c/s BTL on 9/27 Diabetes and medical management per hospitalist. Inpatient care until delivery    Continue routine antenatal care.   Mariel Aloe, MD [facility] Faculty Attending, [facility] 02/11/2021 4:00 PM

## 2021-02-12 LAB — COMPREHENSIVE METABOLIC PANEL
ALT: 48 U/L — ABNORMAL HIGH (ref 0–44)
AST: 43 U/L — ABNORMAL HIGH (ref 15–41)
Albumin: 2.4 g/dL — ABNORMAL LOW (ref 3.5–5.0)
Alkaline Phosphatase: 83 U/L (ref 38–126)
Anion gap: 9 (ref 5–15)
BUN: 32 mg/dL — ABNORMAL HIGH (ref 6–20)
CO2: 29 mmol/L (ref 22–32)
Calcium: 8.3 mg/dL — ABNORMAL LOW (ref 8.9–10.3)
Chloride: 98 mmol/L (ref 98–111)
Creatinine, Ser: 1.39 mg/dL — ABNORMAL HIGH (ref 0.44–1.00)
GFR, Estimated: 53 mL/min — ABNORMAL LOW (ref >60)
Glucose, Bld: 98 mg/dL (ref 70–99)
Potassium: 3.7 mmol/L (ref 3.5–5.1)
Sodium: 136 mmol/L (ref 135–145)
Total Bilirubin: 0.7 mg/dL (ref 0.3–1.2)
Total Protein: 5.1 g/dL — ABNORMAL LOW (ref 6.5–8.1)

## 2021-02-12 LAB — CBC
HCT: 24.7 % — ABNORMAL LOW (ref 36.0–46.0)
Hemoglobin: 7.9 g/dL — ABNORMAL LOW (ref 12.0–15.0)
MCH: 30.6 pg (ref 26.0–34.0)
MCHC: 32 g/dL (ref 30.0–36.0)
MCV: 95.7 fL (ref 80.0–100.0)
Platelets: 161 10*3/uL (ref 150–400)
RBC: 2.58 MIL/uL — ABNORMAL LOW (ref 3.87–5.11)
RDW: 16.3 % — ABNORMAL HIGH (ref 11.5–15.5)
WBC: 6.3 10*3/uL (ref 4.0–10.5)
nRBC: 0 % (ref 0.0–0.2)

## 2021-02-12 LAB — GLUCOSE, CAPILLARY: Glucose-Capillary: 92 mg/dL (ref 70–99)

## 2021-02-12 LAB — HEPARIN LEVEL (UNFRACTIONATED): Heparin Unfractionated: 0.71 IU/mL — ABNORMAL HIGH (ref 0.30–0.70)

## 2021-02-12 MED ORDER — METOCLOPRAMIDE HCL 10 MG PO TABS
5.0000 mg | ORAL_TABLET | Freq: Three times a day (TID) | ORAL | Status: DC
  Administered 2021-02-12 – 2021-02-22 (×37): 5 mg via ORAL
  Filled 2021-02-12 (×38): qty 1

## 2021-02-12 NOTE — Progress Notes (Signed)
Physical Therapy Treatment Patient Details Name: Ashley Camacho MRN: [id number] DOB: Aug 22, 1990 Today's Date: 02/12/2021   History of Present Illness 30 y.o. 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 Medical history significant of [redacted]w[redacted]d on presentation, Estimated Date of Delivery: 03/30/21 by by 9 week ultrasound however trying to make it to 34w for planned c-section, Type 1 diabetic, Cardiomyopathy, chronic systolic heart failure, DVT in bilateral arm. H/o recent hospitalizations d/t hyperemesis and sepsis secondary to pneumonia.    PT Comments    Pt continues to show improved activity tolerance and able to do some stairs (portable step) and is incr amb distance without sitting rest breaks. Pt needs continued incr activity to build endurance prior to return home after upcoming c section.    Recommendations for follow up therapy are one component of a multi-disciplinary discharge planning process, led by the attending physician.  Recommendations may be updated based on patient status, additional functional criteria and insurance authorization.  Follow Up Recommendations  No PT follow up     Equipment Recommendations  None recommended by PT    Recommendations for Other Services       Precautions / Restrictions Precautions Precautions: Other (comment) Precaution Comments: swelling in LEs, pregnancy     Mobility  Bed Mobility Overal bed mobility: Modified Independent                  Transfers Overall transfer level: Modified independent Equipment used: None Transfers: Sit to/from Stand Sit to Stand: Modified independent (Device/Increase time)            Ambulation/Gait Ambulation/Gait assistance: Modified independent (Device/Increase time);Supervision Gait Distance (Feet): 350 Feet Assistive device: None Gait Pattern/deviations: Step-through  pattern;Wide base of support Gait velocity: decreased Gait velocity interpretation: 1.31 - 2.62 ft/sec, indicative of limited community ambulator General Gait Details: Pt with 1 standing rest break   Stairs Stairs: Yes Stairs assistance: Min guard Stair Management: Forwards;Backwards;Step to pattern (countertop support) Number of Stairs: 1 (1 x 5) General stair comments: Assist for safety. Pt with incr effort but able to complete   Wheelchair Mobility    Modified Rankin (Stroke Patients Only)       Balance Overall balance assessment: Mild deficits observed, not formally tested                                          Cognition Arousal/Alertness: Awake/alert Behavior During Therapy: WFL for tasks assessed/performed Overall Cognitive Status: Within Functional Limits for tasks assessed                                        Exercises      General Comments        Pertinent Vitals/Pain Pain Assessment: Faces Faces Pain Scale: Hurts a little bit Pain Location: back Pain Descriptors / Indicators: Grimacing Pain Intervention(s): Limited activity within patient's tolerance    Home Living                      Prior Function            PT Goals (current goals can now be found in the care plan section) Acute Rehab PT Goals Patient Stated Goal: have a healthy baby Progress  towards PT goals: Progressing toward goals    Frequency    Min 2X/week      PT Plan Current plan remains appropriate    Co-evaluation              AM-PAC PT "6 Clicks" Mobility   Outcome Measure  Help needed turning from your back to your side while in a flat bed without using bedrails?: None Help needed moving from lying on your back to sitting on the side of a flat bed without using bedrails?: None   Help needed standing up from a chair using your arms (e.g., wheelchair or bedside chair)?: None Help needed to walk in hospital room?:  None Help needed climbing 3-5 steps with a railing? : A Little 6 Click Score: 19    End of Session   Activity Tolerance: Patient tolerated treatment well Patient left: in bed;with call bell/phone within reach (EOB) Nurse Communication: Mobility status PT Visit Diagnosis: Other abnormalities of gait and mobility (R26.89);Muscle weakness (generalized) (M62.81)     Time: 2956-2130 PT Time Calculation (min) (ACUTE ONLY): 15 min  Charges:  $Therapeutic Exercise: 8-22 mins                     [facility] PT Acute Rehabilitation Services Pager [phone] Office [phone]    Angelina Ok [facility] 02/12/2021, 10:17 AM

## 2021-02-12 NOTE — Progress Notes (Signed)
Chart reviewed.  Hemodynamically stable.  Labs stable.  No reports of bleeding.  Watch hemoglobin closely.  Continue heparin.

## 2021-02-12 NOTE — Progress Notes (Signed)
OT Cancellation Note  Patient Details Name: Ashley Camacho MRN: [id number] DOB: Oct 11, 1990   Cancelled Treatment:    Reason Eval/Treat Not Completed:  (Pt with family visiting, reports she has walked x 2 today.)  [name] 02/12/2021, 3:59 PM Martie Round, OTR/L Acute Rehabilitation Services Pager: [phone] Office: [phone]

## 2021-02-12 NOTE — Progress Notes (Signed)
ANTICOAGULATION CONSULT NOTE - Follow Up Consult  Pharmacy Consult for heparin Indication: right brachial VTE in pregnancy  No Known Allergies  Patient Measurements: Height: 5\' 1"  (154.9 cm) Weight: 76.1 kg (167 lb 12.8 oz) IBW/kg (Calculated) : 47.8 kg   Vital Signs: Temp: 98 F (36.7 C) (09/22 0405) Temp Source: Oral (09/22 0405) BP: 134/58 (09/22 0405) Pulse Rate: 92 (09/22 0405)  Labs: Recent Labs    02/10/21 0433 02/11/21 0420 02/11/21 1112 02/11/21 1400 02/11/21 1953 02/12/21 0527  HGB 8.1* 8.3*  --   --   --  7.9*  HCT 25.1* 25.4*  --   --   --  24.7*  PLT 156 173  --   --   --  161  HEPARINUNFRC 0.72* 0.96*  --  1.04* 0.70 0.71*  CREATININE 1.30*  --  1.48*  --   --  1.39*     Estimated Creatinine Clearance: 55.7 mL/min (A) (by C-G formula based on SCr of 1.39 mg/dL (H)).   Medications:  Scheduled:   sodium chloride   Intravenous Once   Chlorhexidine Gluconate Cloth  6 each Topical Daily   cholecalciferol  1,000 Units Oral Daily   vitamin B-6  12.5 mg Oral BID   And   doxylamine (Sleep)  12.5 mg Oral BID   DULoxetine  20 mg Oral Daily   famotidine  20 mg Oral BID   feeding supplement (GLUCERNA SHAKE)  237 mL Oral BID BM   furosemide  40 mg Intravenous BID   hydrALAZINE  75 mg Oral Q8H   hydrocortisone  25 mg Oral Q24H   And   hydrocortisone  15 mg Oral Q24H   mouth rinse  15 mL Mouth Rinse q12n4p   metoCLOPramide  5 mg Oral TID AC & HS   pantoprazole  40 mg Oral Daily   potassium chloride  40 mEq Oral Daily   prenatal vitamin w/FE, FA  1 tablet Oral Q1200   sodium chloride  1 spray Each Nare Q2H   sodium chloride flush  10-40 mL Intracatheter Q12H   sucralfate  1 g Oral TID WC & HS   terbutaline  0.25 mg Subcutaneous Once   vitamin B-12  1,000 mcg Oral Daily   Infusions:   dextrose 25 mL/hr at 02/11/21 1407   heparin 1,100 Units/hr (02/12/21 0913)   insulin 1.6 Units/hr (02/12/21 0915)    Assessment: 30 yo female [redacted]w[redacted]d gestation on  heparin drip 1150 units/hr for right brachial VTE.  Heparin level this am was 0.71, slightly above therapeutic goal  after reduction yesterday. Hgb 7.9, Pltc 161K overall stable. No bleeding per RN. Plan for C-section 9/27.   Goal of Therapy:  Heparin level 0.3-0.7 units/ml Monitor platelets by anticoagulation protocol: Yes   Plan:  Decrease heparin rate slightly to 1100 units/hr F/u daily heparin level tomorrow AM Monitor CBC  10/27, PharmD, BCPS, BCPPS Clinical Pharmacist  Pager: [phone]  02/12/2021,9:21 AM

## 2021-02-12 NOTE — Progress Notes (Signed)
Patient ID: Ashley Camacho, female   DOB: 06-20-1990, 30 y.o.   MRN: [id number] FACULTY PRACTICE ANTEPARTUM(COMPREHENSIVE) NOTE  Ashley Camacho is a 30 y.o. G2P0101 with Estimated Date of Delivery: 03/30/21   By   [redacted]w[redacted]d  who is admitted for uncontrolled DM with multiple complications.    Fetal presentation is cephalic. Length of Stay:  58  Days  Date of admission:12/16/2020  Subjective: No complaints Patient reports the fetal movement as active. Patient reports uterine contraction  activity as none. Patient reports  vaginal bleeding as none. Patient describes fluid per vagina as None.  Vitals:  Blood pressure (!) 134/58, pulse 92, temperature 98 F (36.7 C), temperature source Oral, resp. rate 16, height 5\' 1"  (1.549 m), weight 76.1 kg, SpO2 97 %. Vitals:   02/11/21 1553 02/11/21 1940 02/12/21 0003 02/12/21 0405  BP: 140/64 125/61 (!) 134/58 (!) 134/58  Pulse: 94 89 87 92  Resp: 16 16 18 16   Temp: 98.9 F (37.2 C) 98.2 F (36.8 C) 98 F (36.7 C) 98 F (36.7 C)  TempSrc: Oral Oral Oral Oral  SpO2: 96% 98% 97%   Weight:      Height:       Physical Examination:  General appearance - alert, well appearing, and in no distress Abdomen - soft, nontender, nondistended, no masses or organomegaly Fundal Height:  size equals dates Pelvic Exam:  examination not indicated Cervical Exam: Not evaluated. . Extremities: extremities normal, atraumatic, no cyanosis or edema with DTRs 2+ bilaterally Membranes:intact  Fetal Monitoring:  Baseline: 140s bpm, Variability: Good {> 6 bpm), Accelerations: Reactive, and Decelerations: Absent   reactive  Labs:  Results for orders placed or performed during the hospital encounter of 12/16/20 (from the past 24 hour(s))  Comprehensive metabolic panel   Collection Time: 02/11/21 11:12 AM  Result Value Ref Range   Sodium 136 135 - 145 mmol/L   Potassium 3.8 3.5 - 5.1 mmol/L   Chloride 98 98 - 111 mmol/L   CO2 29 22 - 32 mmol/L   Glucose, Bld 165 (H)  70 - 99 mg/dL   BUN 32 (H) 6 - 20 mg/dL   Creatinine, Ser 12/18/20 (H) 0.44 - 1.00 mg/dL   Calcium 8.4 (L) 8.9 - 10.3 mg/dL   Total Protein 5.1 (L) 6.5 - 8.1 g/dL   Albumin 2.5 (L) 3.5 - 5.0 g/dL   AST 43 (H) 15 - 41 U/L   ALT 48 (H) 0 - 44 U/L   Alkaline Phosphatase 85 38 - 126 U/L   Total Bilirubin 0.6 0.3 - 1.2 mg/dL   GFR, Estimated 49 (L) >60 mL/min   Anion gap 9 5 - 15  Heparin level (unfractionated)   Collection Time: 02/11/21  2:00 PM  Result Value Ref Range   Heparin Unfractionated 1.04 (H) 0.30 - 0.70 IU/mL  Heparin level (unfractionated)   Collection Time: 02/11/21  7:53 PM  Result Value Ref Range   Heparin Unfractionated 0.70 0.30 - 0.70 IU/mL  Glucose, capillary   Collection Time: 02/12/21  4:59 AM  Result Value Ref Range   Glucose-Capillary 92 70 - 99 mg/dL  CBC   Collection Time: 02/12/21  5:27 AM  Result Value Ref Range   WBC 6.3 4.0 - 10.5 K/uL   RBC 2.58 (L) 3.87 - 5.11 MIL/uL   Hemoglobin 7.9 (L) 12.0 - 15.0 g/dL   HCT 02/14/21 (L) 02/14/21 - 39.5 %   MCV 95.7 80.0 - 100.0 fL   MCH 30.6 26.0 - 34.0  pg   MCHC 32.0 30.0 - 36.0 g/dL   RDW 05.3 (H) 97.6 - 73.4 %   Platelets 161 150 - 400 K/uL   nRBC 0.0 0.0 - 0.2 %  Heparin level (unfractionated)   Collection Time: 02/12/21  5:27 AM  Result Value Ref Range   Heparin Unfractionated 0.71 (H) 0.30 - 0.70 IU/mL  Comprehensive metabolic panel   Collection Time: 02/12/21  5:27 AM  Result Value Ref Range   Sodium 136 135 - 145 mmol/L   Potassium 3.7 3.5 - 5.1 mmol/L   Chloride 98 98 - 111 mmol/L   CO2 29 22 - 32 mmol/L   Glucose, Bld 98 70 - 99 mg/dL   BUN 32 (H) 6 - 20 mg/dL   Creatinine, Ser 1.93 (H) 0.44 - 1.00 mg/dL   Calcium 8.3 (L) 8.9 - 10.3 mg/dL   Total Protein 5.1 (L) 6.5 - 8.1 g/dL   Albumin 2.4 (L) 3.5 - 5.0 g/dL   AST 43 (H) 15 - 41 U/L   ALT 48 (H) 0 - 44 U/L   Alkaline Phosphatase 83 38 - 126 U/L   Total Bilirubin 0.7 0.3 - 1.2 mg/dL   GFR, Estimated 53 (L) >60 mL/min   Anion gap 9 5 - 15     Imaging Studies:    [place] MFM FETAL BPP WO NON STRESS  Result Date: 02/10/2021 ----------------------------------------------------------------------  OBSTETRICS REPORT                       (Signed Final 02/10/2021 06:20 pm) ---------------------------------------------------------------------- Patient Info  ID #:       [id number]                          D.O.B.:  07-10-1990 (29 yrs)  Name:       ZAKIYAH Camacho [facility]                 Visit Date: 02/10/2021 07:49 am ---------------------------------------------------------------------- Performed By  Attending:        Noralee Space MD        Ref. Address:     [facility]                                                             [facility]                                                             [address]                                                             [address]  [address]                                                             [place]                                                             [address]  Performed By:     Hurman Horn          [facility]  Referred By:      Linzie Collin MD ---------------------------------------------------------------------- Orders  #  Description                           Code        Ordered By  1  [place] MFM FETAL BPP WO NON               76811.57    Morey Hummingbird     STRESS ----------------------------------------------------------------------  #  Order #                     Accession #                Episode #  1  [id number]                   [id number]                 [id number] ---------------------------------------------------------------------- Indications  [redacted] weeks gestation of pregnancy                Z3A.33  Polyhydramnios, third trimester, antepartum    O40.3XX0  condition or complication, unspecified fetus  (Resolved)   Pre-existing diabetes, type 1, in pregnancy,   O24.013  third trimester  Hyperemesis gravidarum                         O21.0  Anemia during pregnancy in third trimester     O99.013  Abnormal fetal ultrasound (? liver             O28.9  calcification) ---------------------------------------------------------------------- Fetal Evaluation  Num Of Fetuses:         1  Fetal Heart Rate(bpm):  162  Cardiac Activity:       Observed  Presentation:           Cephalic  Placenta:               Posterior  P. Cord Insertion:      Visualized  Amniotic Fluid  AFI FV:  Polyhydramnios  AFI Sum(cm)     %Tile       Largest Pocket(cm)  24.4            94          6.9  RUQ(cm)       RLQ(cm)       LUQ(cm)        LLQ(cm)  6.3           4.5           6.7            6.9 ---------------------------------------------------------------------- Biophysical Evaluation  Amniotic F.V:   Within normal limits       F. Tone:        Observed  F. Movement:    Observed                   Score:          8/8  F. Breathing:   Observed ---------------------------------------------------------------------- Gestational Age  Best:          33w 1d     Det. ByMarcella Dubs         EDD:   03/30/21                                      (08/27/20) ---------------------------------------------------------------------- Impression  Amniotic fluid is normal and good fetal activity is seen  .Antenatal testing is reassuring. BPP 8/8.  Cephalic  presentation. ----------------------------------------------------------------------                  Noralee Space, MD Electronically Signed Final Report   02/10/2021 06:20 pm ----------------------------------------------------------------------    Medications:  Scheduled  sodium chloride   Intravenous Once   Chlorhexidine Gluconate Cloth  6 each Topical Daily   cholecalciferol  1,000 Units Oral Daily   vitamin B-6  12.5 mg Oral BID   And   doxylamine (Sleep)  12.5 mg Oral BID   DULoxetine  20 mg Oral Daily    famotidine  20 mg Oral BID   feeding supplement (GLUCERNA SHAKE)  237 mL Oral BID BM   furosemide  40 mg Intravenous BID   hydrALAZINE  75 mg Oral Q8H   hydrocortisone  25 mg Oral Q24H   And   hydrocortisone  15 mg Oral Q24H   mouth rinse  15 mL Mouth Rinse q12n4p   metoCLOPramide  5 mg Oral TID AC & HS   pantoprazole  40 mg Oral Daily   potassium chloride  40 mEq Oral Daily   prenatal vitamin w/FE, FA  1 tablet Oral Q1200   sodium chloride  1 spray Each Nare Q2H   sodium chloride flush  10-40 mL Intracatheter Q12H   sucralfate  1 g Oral TID WC & HS   terbutaline  0.25 mg Subcutaneous Once   vitamin B-12  1,000 mcg Oral Daily   I have reviewed the patient's current medications.  ASSESSMENT: G2P0101 [redacted]w[redacted]d Estimated Date of Delivery: 03/30/21  Patient Active Problem List   Diagnosis Date Noted   [redacted] weeks gestation of pregnancy    Acute esophagitis    Leg edema    History of preterm delivery, currently pregnant    Pre-existing type 1 diabetes mellitus during pregnancy in third trimester    Anemia during pregnancy    Type 1 diabetes mellitus affecting pregnancy in second trimester, antepartum  HFrEF (heart failure with reduced ejection fraction) (HCC)    Acute deep vein thrombosis (DVT) of brachial vein of right upper extremity (HCC) 12/17/2020   Anxiety 12/17/2020   Diabetic retinopathy (HCC) 12/17/2020   Acute on chronic combined systolic and diastolic CHF (congestive heart failure) (HCC)    Hyperemesis 12/13/2020   DKA, type 1, not at goal [facility]) 12/13/2020   Sunburn 12/13/2020   Hypothyroidism 12/10/2020   Malnutrition (HCC) 12/10/2020   Vitreous hemorrhage of right eye (HCC) 12/10/2020   Pleural effusion associated with pulmonary infection 11/25/2020   Pneumonia affecting pregnancy 11/24/2020   Diabetes mellitus affecting pregnancy, second trimester 11/24/2020   Edema 11/24/2020   Decreased urine output 11/24/2020   Shortness of breath    Zinc deficiency    SOB  (shortness of breath)    Social problem 11/21/2020   Nausea and vomiting during pregnancy prior to [redacted] weeks gestation 11/21/2020   Low serum vitamin B12    Delivery with history of C-section 11/20/2020   Absolute anemia    Acute kidney injury (HCC)    Nausea and vomiting during pregnancy 10/23/2020   History of premature delivery, currently pregnant, second trimester 10/13/2020   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

## 2021-02-13 LAB — COMPREHENSIVE METABOLIC PANEL
ALT: 53 U/L — ABNORMAL HIGH (ref 0–44)
AST: 43 U/L — ABNORMAL HIGH (ref 15–41)
Albumin: 2.5 g/dL — ABNORMAL LOW (ref 3.5–5.0)
Alkaline Phosphatase: 77 U/L (ref 38–126)
Anion gap: 8 (ref 5–15)
BUN: 36 mg/dL — ABNORMAL HIGH (ref 6–20)
CO2: 30 mmol/L (ref 22–32)
Calcium: 8.4 mg/dL — ABNORMAL LOW (ref 8.9–10.3)
Chloride: 98 mmol/L (ref 98–111)
Creatinine, Ser: 1.36 mg/dL — ABNORMAL HIGH (ref 0.44–1.00)
GFR, Estimated: 54 mL/min — ABNORMAL LOW (ref >60)
Glucose, Bld: 99 mg/dL (ref 70–99)
Potassium: 3.5 mmol/L (ref 3.5–5.1)
Sodium: 136 mmol/L (ref 135–145)
Total Bilirubin: 0.6 mg/dL (ref 0.3–1.2)
Total Protein: 5.1 g/dL — ABNORMAL LOW (ref 6.5–8.1)

## 2021-02-13 LAB — CBC
HCT: 24.8 % — ABNORMAL LOW (ref 36.0–46.0)
Hemoglobin: 8.2 g/dL — ABNORMAL LOW (ref 12.0–15.0)
MCH: 31.7 pg (ref 26.0–34.0)
MCHC: 33.1 g/dL (ref 30.0–36.0)
MCV: 95.8 fL (ref 80.0–100.0)
Platelets: 166 10*3/uL (ref 150–400)
RBC: 2.59 MIL/uL — ABNORMAL LOW (ref 3.87–5.11)
RDW: 16.2 % — ABNORMAL HIGH (ref 11.5–15.5)
WBC: 6.5 10*3/uL (ref 4.0–10.5)
nRBC: 0 % (ref 0.0–0.2)

## 2021-02-13 LAB — HEPARIN LEVEL (UNFRACTIONATED)
Heparin Unfractionated: 0.76 IU/mL — ABNORMAL HIGH (ref 0.30–0.70)
Heparin Unfractionated: 0.38 IU/mL (ref 0.30–0.70)

## 2021-02-13 LAB — TYPE AND SCREEN
ABO/RH(D): A POS
Antibody Screen: NEGATIVE

## 2021-02-13 LAB — GLUCOSE, CAPILLARY: Glucose-Capillary: 109 mg/dL — ABNORMAL HIGH (ref 70–99)

## 2021-02-13 MED ORDER — FUROSEMIDE 10 MG/ML IJ SOLN
40.0000 mg | Freq: Two times a day (BID) | INTRAMUSCULAR | Status: DC
  Administered 2021-02-13 – 2021-02-15 (×4): 40 mg via INTRAVENOUS
  Filled 2021-02-13 (×4): qty 4

## 2021-02-13 NOTE — Progress Notes (Signed)
PROGRESS NOTE  ROBERTA ANGELL QMV:[id number] DOB: [birth year] DOA: 12/16/2020 PCP: Hildred Laser, MD  HPI/Recap of past 24 hours: LATALIA ETZLER is a 30 y.o. G2P0101 with a history of T1DM, chronic HFrEF who was admitted initially 6/30 to [facility] for nausea and vomiting subsequently transferred to [facility] for rhinovirus pneumonia, discharged 7/20 only to return 7/22 with starvation ketosis and brachial vein DVT. TPN thru PICC was started and ultimately the patient was transferred to [facility] for cortrack placement on 7/26. Further complications detailed below:  7/27 cardiology consulted for cardiomyopathy, PICC line inserted, tube feeding started. 7/29 having recurrent retention, Foley catheter inserted 7/30 PRBC transfused 8/5 PRBC transfused 8/15 UCX pansensitive Enterococcus faecalis. 8/16 cardiology signed off, no follow-up recommended 8/19 nephrology consulted, no follow-up recommended 8/20 PRBC transfused 8/21 rescue course of betamethasone 8/22 Foley catheter reinserted after removal due to retention. 8/23 GI consulted. MFM consulted, nephrology signed off. 8/24 underwent EGD shows severe esophagitis with mild oozing.   Dobbhoff removed patient on oral diet. 8/25 PICC line removed. 8/26 started on steroids for adrenal insufficiency. 8/27 2 episodes of hematemesis.  Started on PPI drip.  GI reconsulted.  Lovenox held. 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 8/30 PICC line reinserted due to poor IV access, difficulty with blood draws and need for IV heparin. 9/9 Hematuria, IV Lasix stopped and started on p.o. amoxicillin 500 mg twice daily due to concern for hemorrhagic cystitis.  Augmentin was stopped on 02/01/21  9/13: Still with hematuria.  Restarted Iv lasix 20 mg daily x 3 days, Kcl po 20 meq x 3 days.  Assessment/Plan: Active Problems:   Delivery with history of C-section   Acute kidney injury (HCC)   Hyperemesis   DKA, type 1, not  at goal 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 Orvan Falconer.  IV Protonix switched to oral on 01/28/2021.  Continue Pepcid, Protonix and Carafate No plan to repeat endoscopic evaluation at this time.  Gross hematuria, unclear etiology Initial concern for hemorrhagic cystitis but repeat urine cx was negative.  Augmentin started on 9/9 was Dced on 9/11 by obgyn Renal ultrasound obtained as discussed below.  Urine is now clear.  Nonetheless, patient told me that she has been told by her OB physician that they have discussed her case with urology and since she is pregnant, they cannot do any cystoscopy at this point in time anyways.  We will continue to monitor her hemoglobin which has remained stable since last several days now.  Acute urinary retention/bilateral hydronephrosis Retention improved.  Voiding spontaneously.  Renal ultrasound showed right kidney moderate hydronephrosis, slightly improved compared to recent prior examination, left kidney mild hydronephrosis which is new.  We will repeat ultrasound next week.  Nausea and vomiting/hyperemesis/gastroparesis in the setting  of pregnancy Management per OB. Decreased insulin rate on 9/11 to avoid hypoglycemia with poor oral intake.  According to patient, she has no more nausea and  her oral intake is improved and she is consuming about 100% of her lunch and dinner.  Remains on insulin drip. Closely monitor for hypoglycemia  Elevated liver chemistries, downtrending Normal T bilirubin.  Does not appear to be hemolyzing. Continue to closely monitor intermittently.  Acute blood loss anemia in the setting of hematuria Hemoglobin is stable between 7 and 8.  Transfuse if drops less than 7.  Type 1 diabetes mellitus with recurrent Hypoglycemia, due to poor oral intake, nausea with vomiting. Continue insulin drip and D10. Continue to encourage increase in oral intake.  Blood sugar fairly controlled since last several days now. Antiemetics as needed  Worsening Anasarca due to severe malnutrition related to hyperemesis as above as well as acute on chronic HFrEF, HTN:  Updated echo showed improved LVEF 45% now up to 50-55%. RV and pulmonary artery pressures wnl. Small pericardial effusion noted without tamponade. Off Lasix since yesterday.  Patient tells me that her edema has worsened and she is requesting to be placed back on Lasix.  On exam, she does have +3 pitting edema all the way up to the waist.  We will continue 40 mg IV Lasix twice daily and reassess on Monday.  Chronic insomnia She is currently on Unisom 12.5 mg twice daily  Acute thrombocytopenia Improving.  Monitor closely.  Adrenal insufficiency: ACTH stim test diagnostic.  Continue Cortef 25 mg in the morning and 15 mg at 5 PM.  Will need to stress dose around her C-section at 100 mg IV every 8 hours for 1 day followed by 50 mg every 8 hours for 1 day followed by 50 mg twice daily for 1 day and then back to her current dose.   Right brachial vein DVT: Dx 12/07/20.  Seems to be recannulating based on repeat U/S.  Continue heparin.  No signs of bleeding.   T1DM with gastroparesis and peripheral neuropathy: Brittle control due to sensitivity and inconsistent calorie inputs. HbA1c was 5.8% 11/21/2020 (impacted by  mid-late pregnancy).  She is on scheduled IV Reglan We will keep on insulin drip and D10 W as she is approaching her delivery date. Appreciate diabetes coordinator's assistance. Avoid hypoglycemia.  Hypokalemia: Resolved.  Hypomagnesemia: Resolved   HTN: Fairly controlled, continue to closely monitor. Continue p.o. hydralazine 75mg  q8h.  Continue to monitor for preeclampsia.    Resolved nonoliguric AKI, prerenal in the setting of dehydration from poor oral intake, intake is now improved:  Creatinine 1.05 from 1.11 from 1.16.  GFR greater than 60. Avoid nephrotoxic agents Monitor back on diuretic Continue to monitor urine output  IUP:  - MFM recommends rLTCS at 34 weeks (9/20). Continue NSTs/BPP/serial growth scans. Leaving orders in this regard to MFM/OB.  NST on 9/8 was reactive, reassuring.  - Has received BMZ - Procardia given 9/5 and on 01/29/2021 for contractions.  - OB co-managing, appreciate OB/GYN's assistance.   Treated Enterococcus faecalis UTI: Urine culture collected from 01/05/2021, pansensitive, treated with amoxicillin 8/16 - 8/22.   Limited venous access:  - PICC reinserted 8/30.  Epistaxis/vaginal bleeding: Due to reports of epistaxis on 02/07/2021, heparin drip was stopped.  She also had spotting of vaginal bleeding on 02/08/2021.  Heparin was resumed on 02/09/2021 after 48 hours of holiday, according to patient, no further bleeding.    DVT prophylaxis: Heparin gtt Code Status: Full Family Communication:  None at bedside Disposition Plan:  Status is: Inpatient   Remains inpatient appropriate because:IV treatments appropriate due to intensity of illness or inability to take PO   Dispo: The patient is from: Home              Anticipated d/c is to: Home with OB/GYN signs off.              Patient currently is not medically stable to d/c.   Consultants:  OB/GYN Nephrology Cardiology GI  Subjective: Patient seen and examined.  She has no  complaints  Objective: Vitals:   02/12/21 2014 02/12/21 2326 02/13/21 0417 02/13/21 0808  BP: 136/63 (!) 151/55 (!) 140/57 (!) 135/55  Pulse: 86 84 88 87  Resp: 16 18 16 17   Temp: 98 F (36.7 C) 98.1 F (36.7 C) 98.2 F (36.8 C) 98 F (36.7 C)  TempSrc: Oral Oral Oral Oral  SpO2: 98% 97% 98% 100%  Weight:      Height:        Intake/Output Summary (Last 24 hours) at 02/13/2021 1021 Last data filed at 02/13/2021 0600 Gross per 24 hour  Intake 1548.67 ml  Output 3300 ml  Net -1751.33 ml    Filed Weights   01/24/21 0603 01/26/21 0530 02/08/21 1157  Weight: 81.7 kg 80.6 kg 76.1 kg    Exam: General exam: Appears calm and comfortable  Respiratory system: Clear to auscultation. Respiratory effort normal. Cardiovascular system: S1 & S2 heard, RRR. No JVD, murmurs, rubs, gallops or clicks.  +2 pitting edema bilateral lower extremity Gastrointestinal system: Abdomen is gravid.  No organomegaly or masses felt. Normal bowel sounds heard. Central nervous system: Alert and oriented. No focal neurological deficits. Extremities: Symmetric 5 x 5 power. Skin: No rashes, lesions or ulcers.  Psychiatry: Judgement and insight appear normal. Mood & affect appropriate.    Data Reviewed: CBC: Recent Labs  Lab 02/09/21 0822 02/10/21 0433 02/11/21 0420 02/12/21 0527 02/13/21 0523  WBC 5.5 5.7 6.5 6.3 6.5  HGB 8.3* 8.1* 8.3* 7.9* 8.2*  HCT 25.6* 25.1* 25.4* 24.7* 24.8*  MCV 94.8 94.7 95.5 95.7 95.8  PLT 144* 156 173 161 166    Basic Metabolic Panel: Recent Labs  Lab 02/09/21 0822 02/10/21 0433 02/10/21 1049 02/11/21 1112 02/12/21 0527 02/13/21 0523  NA 135 133*  --  136 136 136  K 3.3* 3.3*  --  3.8 3.7 3.5  CL 98 94*  --  98 98 98  CO2 27 28  --  29 29 30   GLUCOSE 133* 357*  --  165* 98 99  BUN 24* 27*  --  32* 32* 36*  CREATININE 1.35* 1.30*  --  1.48* 1.39* 1.36*  CALCIUM 8.5* 8.3*  --  8.4* 8.3* 8.4*  MG  --   --  1.7  --   --   --     GFR: Estimated Creatinine  Clearance: 56.9 mL/min (A) (by C-G formula based on SCr of 1.36 mg/dL (H)). Liver Function Tests: Recent Labs  Lab 02/10/21 0433 02/10/21 1049 02/11/21 1112 02/12/21 0527 02/13/21 0523  AST 29 34 43* 43* 43*  ALT 39 42 48* 48* 53*  ALKPHOS 78 78 85 83 77  BILITOT 0.5 0.6 0.6 0.7 0.6  PROT 4.8* 5.2* 5.1* 5.1* 5.1*  ALBUMIN 2.4* 2.5* 2.5* 2.4* 2.5*    No results for input(s): LIPASE, AMYLASE in the last 168 hours. No results for input(s): AMMONIA in the last 168 hours. Coagulation Profile: No results for  input(s): INR, PROTIME in the last 168 hours. Cardiac Enzymes: No results for input(s): CKTOTAL, CKMB, CKMBINDEX, TROPONINI in the last 168 hours. BNP (last 3 results) No results for input(s): PROBNP in the last 8760 hours. HbA1C: No results for input(s): HGBA1C in the last 72 hours. CBG: Recent Labs  Lab 02/09/21 0523 02/10/21 0517 02/11/21 0417 02/12/21 0459 02/13/21 0515  GLUCAP 90 99 88 92 109*    Lipid Profile: No results for input(s): CHOL, HDL, LDLCALC, TRIG, CHOLHDL, LDLDIRECT in the last 72 hours. Thyroid Function Tests: No results for input(s): TSH, T4TOTAL, FREET4, T3FREE, THYROIDAB in the last 72 hours. Anemia Panel: No results for input(s): VITAMINB12, FOLATE, FERRITIN, TIBC, IRON, RETICCTPCT in the last 72 hours.  Urine analysis:    Component Value Date/Time   COLORURINE AMBER (A) 01/10/2021 0131   APPEARANCEUR CLOUDY (A) 01/10/2021 0131   APPEARANCEUR Clear 09/18/2020 1153   LABSPEC 1.017 01/10/2021 0131   LABSPEC 1.026 03/01/2013 1844   PHURINE 5.0 01/10/2021 0131   GLUCOSEU NEGATIVE 01/10/2021 0131   GLUCOSEU >=500 03/01/2013 1844   HGBUR NEGATIVE 01/10/2021 0131   BILIRUBINUR NEGATIVE 01/10/2021 0131   BILIRUBINUR neg 10/23/2020 0953   BILIRUBINUR Negative 09/18/2020 1153   BILIRUBINUR Negative 03/01/2013 1844   KETONESUR NEGATIVE 01/10/2021 0131   PROTEINUR NEGATIVE 01/10/2021 0131   UROBILINOGEN 0.2 10/23/2020 0953   NITRITE NEGATIVE  01/10/2021 0131   LEUKOCYTESUR SMALL (A) 01/10/2021 0131   LEUKOCYTESUR Negative 03/01/2013 1844   Sepsis Labs: @LABRCNTIP (procalcitonin:4,lacticidven:4)  ) No results found for this or any previous visit (from the past 240 hour(s)).     Studies: No results found.  Scheduled Meds:  sodium chloride   Intravenous Once   Chlorhexidine Gluconate Cloth  6 each Topical Daily   cholecalciferol  1,000 Units Oral Daily   vitamin B-6  12.5 mg Oral BID   And   doxylamine (Sleep)  12.5 mg Oral BID   DULoxetine  20 mg Oral Daily   famotidine  20 mg Oral BID   feeding supplement (GLUCERNA SHAKE)  237 mL Oral BID BM   hydrALAZINE  75 mg Oral Q8H   hydrocortisone  25 mg Oral Q24H   And   hydrocortisone  15 mg Oral Q24H   mouth rinse  15 mL Mouth Rinse q12n4p   metoCLOPramide  5 mg Oral TID AC & HS   pantoprazole  40 mg Oral Daily   potassium chloride  40 mEq Oral Daily   prenatal vitamin w/FE, FA  1 tablet Oral Q1200   sodium chloride  1 spray Each Nare Q2H   sodium chloride flush  10-40 mL Intracatheter Q12H   sucralfate  1 g Oral TID WC & HS   terbutaline  0.25 mg Subcutaneous Once   vitamin B-12  1,000 mcg Oral Daily    Continuous Infusions:  dextrose 25 mL/hr at 02/12/21 1910   heparin 1,000 Units/hr (02/13/21 0647)   insulin 1.7 Units/hr (02/13/21 0947)     LOS: 59 days     02/15/21, MD [facility]  If 7PM-7AM, please contact night-coverage [email or web address] 02/13/2021, 10:21 AM

## 2021-02-13 NOTE — Progress Notes (Signed)
FACULTY PRACTICE ANTEPARTUM PROGRESS NOTE  Ashley Camacho is a 30 y.o. G2P0101 at 110w4d who is admitted for inpatient management of Type 1 DM complications and gastroparesis.  Estimated Date of Delivery: 03/30/21 Fetal presentation is cephalic.  Length of Stay:  59 Days. Admitted 12/16/2020  Subjective: Pt seen, doing well. No major changes from obstetric context Patient reports normal fetal movement.  She denies uterine contractions, denies bleeding and leaking of fluid per vagina.  Vitals:  Blood pressure (!) 135/55, pulse 87, temperature 98 F (36.7 C), temperature source Oral, resp. rate 17, height 5\' 1"  (1.549 m), weight 76.1 kg, SpO2 100 %. Physical Examination: CONSTITUTIONAL: Well-developed, well-nourished female in no acute distress.  HENT:  Normocephalic, atraumatic, External right and left ear normal. Oropharynx is clear and moist EYES: Conjunctivae and EOM are normal. Pupils are equal, round, and reactive to light. No scleral icterus.  NECK: Normal range of motion, supple, no masses. SKIN: Skin is warm and dry. No rash noted. Not diaphoretic. No erythema. No pallor. NEUROLGIC: Alert and oriented to person, place, and time. Normal reflexes, muscle tone coordination. No cranial nerve deficit noted. PSYCHIATRIC: Normal mood and affect. Normal behavior. Normal judgment and thought content. CARDIOVASCULAR: Normal heart rate noted, regular rhythm RESPIRATORY: Effort and breath sounds normal, no problems with respiration noted MUSCULOSKELETAL: Normal range of motion. No edema and no tenderness. ABDOMEN: Soft, nontender, nondistended, gravid. CERVIX: deferred  Fetal monitoring: pending  Results for orders placed or performed during the hospital encounter of 12/16/20 (from the past 48 hour(s))  Heparin level (unfractionated)     Status: Abnormal   Collection Time: 02/11/21  2:00 PM  Result Value Ref Range   Heparin Unfractionated 1.04 (H) 0.30 - 0.70 IU/mL    Comment: (NOTE) The  clinical reportable range upper limit is being lowered to >1.10 to align with the FDA approved guidance for the current laboratory assay.  If heparin results are below expected values, and patient dosage has  been confirmed, suggest follow up testing of antithrombin III levels. Performed at [facility], [address], [place], [place]   Heparin level (unfractionated)     Status: None   Collection Time: 02/11/21  7:53 PM  Result Value Ref Range   Heparin Unfractionated 0.70 0.30 - 0.70 IU/mL    Comment: (NOTE) The clinical reportable range upper limit is being lowered to >1.10 to align with the FDA approved guidance for the current laboratory assay.  If heparin results are below expected values, and patient dosage has  been confirmed, suggest follow up testing of antithrombin III levels. Performed at [facility], [address] [place], [place]   Glucose, capillary     Status: None   Collection Time: 02/12/21  4:59 AM  Result Value Ref Range   Glucose-Capillary 92 70 - 99 mg/dL    Comment: Glucose reference range applies only to samples taken after fasting for at least 8 hours.  CBC     Status: Abnormal   Collection Time: 02/12/21  5:27 AM  Result Value Ref Range   WBC 6.3 4.0 - 10.5 K/uL   RBC 2.58 (L) 3.87 - 5.11 MIL/uL   Hemoglobin 7.9 (L) 12.0 - 15.0 g/dL   HCT 02/14/21 (L) 02.7 - 74.1 %   MCV 95.7 80.0 - 100.0 fL   MCH 30.6 26.0 - 34.0 pg   MCHC 32.0 30.0 - 36.0 g/dL   RDW 28.7 (H) 86.7 - 67.2 %  Platelets 161 150 - 400 K/uL   nRBC 0.0 0.0 - 0.2 %    Comment: Performed at [facility], [address] [place], [place] [address]  Heparin level (unfractionated)     Status: Abnormal   Collection Time: 02/12/21  5:27 AM  Result Value Ref Range   Heparin Unfractionated 0.71 (H) 0.30 - 0.70 IU/mL    Comment: (NOTE) The clinical reportable range upper limit is being lowered to >1.10 to align with the FDA approved guidance for the  current laboratory assay.  If heparin results are below expected values, and patient dosage has  been confirmed, suggest follow up testing of antithrombin III levels. Performed at [facility], [address] [place], [place] [address]   Comprehensive metabolic panel     Status: Abnormal   Collection Time: 02/12/21  5:27 AM  Result Value Ref Range   Sodium 136 135 - 145 mmol/L   Potassium 3.7 3.5 - 5.1 mmol/L   Chloride 98 98 - 111 mmol/L   CO2 29 22 - 32 mmol/L   Glucose, Bld 98 70 - 99 mg/dL    Comment: Glucose reference range applies only to samples taken after fasting for at least 8 hours.   BUN 32 (H) 6 - 20 mg/dL   Creatinine, Ser 9.62 (H) 0.44 - 1.00 mg/dL   Calcium 8.3 (L) 8.9 - 10.3 mg/dL   Total Protein 5.1 (L) 6.5 - 8.1 g/dL   Albumin 2.4 (L) 3.5 - 5.0 g/dL   AST 43 (H) 15 - 41 U/L   ALT 48 (H) 0 - 44 U/L   Alkaline Phosphatase 83 38 - 126 U/L   Total Bilirubin 0.7 0.3 - 1.2 mg/dL   GFR, Estimated 53 (L) >60 mL/min    Comment: (NOTE) Calculated using the CKD-EPI Creatinine Equation (2021)    Anion gap 9 5 - 15    Comment: Performed at [facility], [address] [place], [place] [address]  Glucose, capillary     Status: Abnormal   Collection Time: 02/13/21  5:15 AM  Result Value Ref Range   Glucose-Capillary 109 (H) 70 - 99 mg/dL    Comment: Glucose reference range applies only to samples taken after fasting for at least 8 hours.  CBC     Status: Abnormal   Collection Time: 02/13/21  5:23 AM  Result Value Ref Range   WBC 6.5 4.0 - 10.5 K/uL   RBC 2.59 (L) 3.87 - 5.11 MIL/uL   Hemoglobin 8.2 (L) 12.0 - 15.0 g/dL   HCT 94.7 (L) 65.4 - 65.0 %   MCV 95.8 80.0 - 100.0 fL   MCH 31.7 26.0 - 34.0 pg   MCHC 33.1 30.0 - 36.0 g/dL   RDW 35.4 (H) 65.6 - 81.2 %   Platelets 166 150 - 400 K/uL   nRBC 0.0 0.0 - 0.2 %    Comment: Performed at [facility], [address], [place], [place] [address]  Heparin level (unfractionated)     Status: Abnormal    Collection Time: 02/13/21  5:23 AM  Result Value Ref Range   Heparin Unfractionated 0.76 (H) 0.30 - 0.70 IU/mL    Comment: (NOTE) The clinical reportable range upper limit is being lowered to >1.10 to align with the FDA approved guidance for the current laboratory assay.  If heparin results are below expected values, and patient dosage has  been confirmed, suggest follow up testing of antithrombin III levels. Performed  at [facility], [address] [place], [place] [address]   Comprehensive metabolic panel     Status: Abnormal   Collection Time: 02/13/21  5:23 AM  Result Value Ref Range   Sodium 136 135 - 145 mmol/L   Potassium 3.5 3.5 - 5.1 mmol/L   Chloride 98 98 - 111 mmol/L   CO2 30 22 - 32 mmol/L   Glucose, Bld 99 70 - 99 mg/dL    Comment: Glucose reference range applies only to samples taken after fasting for at least 8 hours.   BUN 36 (H) 6 - 20 mg/dL   Creatinine, Ser 5.36 (H) 0.44 - 1.00 mg/dL   Calcium 8.4 (L) 8.9 - 10.3 mg/dL   Total Protein 5.1 (L) 6.5 - 8.1 g/dL   Albumin 2.5 (L) 3.5 - 5.0 g/dL   AST 43 (H) 15 - 41 U/L   ALT 53 (H) 0 - 44 U/L   Alkaline Phosphatase 77 38 - 126 U/L   Total Bilirubin 0.6 0.3 - 1.2 mg/dL   GFR, Estimated 54 (L) >60 mL/min    Comment: (NOTE) Calculated using the CKD-EPI Creatinine Equation (2021)    Anion gap 8 5 - 15    Comment: Performed at [facility], [address], [place], [place] [address]    I have reviewed the patient's current medications.  ASSESSMENT: Active Problems:   Delivery with history of C-section   Acute kidney injury (HCC)   Hyperemesis   DKA, type 1, not at goal [facility])   Acute deep vein thrombosis (DVT) of brachial vein of right upper extremity (HCC)   Vitreous hemorrhage of right eye (HCC)   Gastroparesis due to DM (HCC)   Diabetic retinopathy (HCC)   Acute on chronic combined systolic and diastolic CHF (congestive heart failure) (HCC)   Type 1 diabetes mellitus affecting pregnancy  in second trimester, antepartum   HFrEF (heart failure with reduced ejection fraction) (HCC)   Anemia during pregnancy   Pre-existing type 1 diabetes mellitus during pregnancy in third trimester   History of preterm delivery, currently pregnant   Leg edema   Acute esophagitis   [redacted] weeks gestation of pregnancy   PLAN: Continue current inpatient care Will start heparin 12-24 hours before procedure.  Will check with pharmacy to solidify the timing   Continue routine antenatal care.   Mariel Aloe, MD [facility] Faculty Attending, [facility] for [facility] 02/13/2021 11:27 AM

## 2021-02-13 NOTE — Progress Notes (Signed)
Occupational Therapy Treatment Patient Details Name: Ashley Camacho MRN: [id number] DOB: Jan 06, 1991 Today's Date: 02/13/2021   History of present illness 30 y.o. 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 Medical history significant of [redacted]w[redacted]d on presentation, Estimated Date of Delivery: 03/30/21 by by 9 week ultrasound however trying to make it to 34w for planned c-section, Type 1 diabetic, Cardiomyopathy, chronic systolic heart failure, DVT in bilateral arm. H/o recent hospitalizations d/t hyperemesis and sepsis secondary to pneumonia.   OT comments  Pt reports she has been more tired today, but feeling well. Had not taken a walk. Assisted to don socks and front opening gown. Pt able to manage toileting and standing grooming with IV pole. Ambulated to entrance of hospital to bench and back to room as weather was conducive to sitting outside for a few minutes.    Recommendations for follow up therapy are one component of a multi-disciplinary discharge planning process, led by the attending physician.  Recommendations may be updated based on patient status, additional functional criteria and insurance authorization.    Follow Up Recommendations  No OT follow up;Supervision/Assistance - 24 hour    Equipment Recommendations  None recommended by OT    Recommendations for Other Services      Precautions / Restrictions Precautions Precautions: Other (comment) Precaution Comments: swelling in LEs, pregnancy       Mobility Bed Mobility Overal bed mobility: Modified Independent                  Transfers Overall transfer level: Modified independent Equipment used: None                  Balance Overall balance assessment: Mild deficits observed, not formally tested                                         ADL either performed or assessed with  clinical judgement   ADL Overall ADL's : Needs assistance/impaired     Grooming: Modified independent;Standing;Wash/dry hands           Upper Body Dressing : Set up;Sitting   Lower Body Dressing: Maximal assistance;Sitting/lateral leans Lower Body Dressing Details (indicate cue type and reason): for socks Toilet Transfer: Modified Independent;Ambulation   Toileting- Clothing Manipulation and Hygiene: Modified independent;Sit to/from stand       Functional mobility during ADLs: Supervision/safety       Vision       Perception     Praxis      Cognition Arousal/Alertness: Awake/alert Behavior During Therapy: WFL for tasks assessed/performed Overall Cognitive Status: Within Functional Limits for tasks assessed                                          Exercises     Shoulder Instructions       General Comments      Pertinent Vitals/ Pain       Pain Assessment: No/denies pain  Home Living                                          Prior Functioning/Environment  Frequency  Min 2X/week        Progress Toward Goals  OT Goals(current goals can now be found in the care plan section)  Progress towards OT goals: Progressing toward goals  Acute Rehab OT Goals Patient Stated Goal: have a healthy baby OT Goal Formulation: With patient Time For Goal Achievement: 02/18/21 Potential to Achieve Goals: Good  Plan Discharge plan remains appropriate    Co-evaluation                 AM-PAC OT "6 Clicks" Daily Activity     Outcome Measure   Help from another person eating meals?: None Help from another person taking care of personal grooming?: None Help from another person toileting, which includes using toliet, bedpan, or urinal?: None Help from another person bathing (including washing, rinsing, drying)?: A Little Help from another person to put on and taking off regular upper body clothing?:  None Help from another person to put on and taking off regular lower body clothing?: A Lot 6 Click Score: 21    End of Session    OT Visit Diagnosis: Muscle weakness (generalized) (M62.81);Pain;Feeding difficulties (R63.3)   Activity Tolerance Patient tolerated treatment well   Patient Left in bed;with call bell/phone within reach   Nurse Communication          Time: 5188-4166 OT Time Calculation (min): 34 min  Charges: OT General Charges $OT Visit: 1 Visit OT Treatments $Self Care/Home Management : 8-22 mins $Therapeutic Activity: 8-22 mins  Martie Round, OTR/L Acute Rehabilitation Services Pager: [phone] Office: [phone]  [name] 02/13/2021, 3:37 PM

## 2021-02-13 NOTE — Progress Notes (Signed)
ANTICOAGULATION CONSULT NOTE - Follow Up Consult  Pharmacy Consult for heparin Indication:  right brachial VTE in pregnancy  No Known Allergies  Patient Measurements: Height: 5\' 1"  (154.9 cm) Weight: 76.1 kg (167 lb 12.8 oz) IBW/kg (Calculated) : 47.8 Heparin Dosing Weight: 64.8 kg  Vital Signs: Temp: 98.2 F (36.8 C) (09/23 0417) Temp Source: Oral (09/23 0417) BP: 140/57 (09/23 0417) Pulse Rate: 88 (09/23 0417)  Labs: Recent Labs    02/11/21 0420 02/11/21 1112 02/11/21 1400 02/11/21 1953 02/12/21 0527 02/13/21 0523  HGB 8.3*  --   --   --  7.9* 8.2*  HCT 25.4*  --   --   --  24.7* 24.8*  PLT 173  --   --   --  161 166  HEPARINUNFRC 0.96*  --    < > 0.70 0.71* 0.76*  CREATININE  --  1.48*  --   --  1.39* 1.36*   < > = values in this interval not displayed.    Estimated Creatinine Clearance: 56.9 mL/min (A) (by C-G formula based on SCr of 1.36 mg/dL (H)).   Assessment: 30 yo at [redacted]w[redacted]d on heparin at 1100 units/hr for right brachial VTE.  Heparin level this AM is 0.76 after decrease in rate yesterday due to HL of 0.71.  H/H and Plts stable.  Goal of Therapy:  Heparin level 0.3-0.7 units/ml Monitor platelets by anticoagulation protocol: Yes   Plan:  Decrease heparin by ~1.5 units/kg to 1000 units/hr.  Check anti-Xa level in 6 hours and daily while on heparin Continue to monitor H&H and platelets   Moskal Scarlett 02/13/2021,6:21 AM

## 2021-02-13 NOTE — Progress Notes (Signed)
ANTICOAGULATION CONSULT NOTE - Follow Up Consult  Pharmacy Consult for heparin Indication: right brachial VTE in pregnancy  No Known Allergies  Patient Measurements: Height: 5\' 1"  (154.9 cm) Weight: 76.1 kg (167 lb 12.8 oz) IBW/kg (Calculated) : 47.8 kg   Vital Signs: Temp: 98 F (36.7 C) (09/23 1142) Temp Source: Oral (09/23 1142) BP: 151/58 (09/23 1426) Pulse Rate: 90 (09/23 1142)  Labs: Recent Labs    02/11/21 0420 02/11/21 1112 02/11/21 1400 02/12/21 0527 02/13/21 0523 02/13/21 1257  HGB 8.3*  --   --  7.9* 8.2*  --   HCT 25.4*  --   --  24.7* 24.8*  --   PLT 173  --   --  161 166  --   HEPARINUNFRC 0.96*  --    < > 0.71* 0.76* 0.38  CREATININE  --  1.48*  --  1.39* 1.36*  --    < > = values in this interval not displayed.     Estimated Creatinine Clearance: 56.9 mL/min (A) (by C-G formula based on SCr of 1.36 mg/dL (H)).   Medications:  Scheduled:   sodium chloride   Intravenous Once   Chlorhexidine Gluconate Cloth  6 each Topical Daily   cholecalciferol  1,000 Units Oral Daily   vitamin B-6  12.5 mg Oral BID   And   doxylamine (Sleep)  12.5 mg Oral BID   DULoxetine  20 mg Oral Daily   famotidine  20 mg Oral BID   feeding supplement (GLUCERNA SHAKE)  237 mL Oral BID BM   furosemide  40 mg Intravenous BID   hydrALAZINE  75 mg Oral Q8H   hydrocortisone  25 mg Oral Q24H   And   hydrocortisone  15 mg Oral Q24H   mouth rinse  15 mL Mouth Rinse q12n4p   metoCLOPramide  5 mg Oral TID AC & HS   pantoprazole  40 mg Oral Daily   potassium chloride  40 mEq Oral Daily   prenatal vitamin w/FE, FA  1 tablet Oral Q1200   sodium chloride  1 spray Each Nare Q2H   sodium chloride flush  10-40 mL Intracatheter Q12H   sucralfate  1 g Oral TID WC & HS   terbutaline  0.25 mg Subcutaneous Once   vitamin B-12  1,000 mcg Oral Daily   Infusions:   dextrose 25 mL/hr at 02/12/21 1910   heparin 1,000 Units/hr (02/13/21 1308)   insulin 1.1 Units/hr (02/13/21 1426)     Assessment: 30 yo female [redacted]w[redacted]d gestation on heparin drip for right brachial VTE.  Heparin level 0.38 after rate is decreased to 1000 units/hr. No interruption with infusion per RN. Hgb 8.3, Pltc 173K overall stable. No bleeding per RN. Plan for C-section 9/27.   Goal of Therapy:  Heparin level 0.3-0.7 units/ml Monitor platelets by anticoagulation protocol: Yes   Plan:  Continue heparin infusion 1000 units/hr F/u daily heparin level tomorrow AM Monitor CBC  10/27, PharmD, BCPS, BCPPS Clinical Pharmacist  Pager: [phone]  02/13/2021,3:12 PM

## 2021-02-14 DIAGNOSIS — R7989 Other specified abnormal findings of blood chemistry: Secondary | ICD-10-CM

## 2021-02-14 DIAGNOSIS — E2740 Unspecified adrenocortical insufficiency: Secondary | ICD-10-CM

## 2021-02-14 DIAGNOSIS — R319 Hematuria, unspecified: Secondary | ICD-10-CM

## 2021-02-14 DIAGNOSIS — R748 Abnormal levels of other serum enzymes: Secondary | ICD-10-CM

## 2021-02-14 DIAGNOSIS — N1330 Unspecified hydronephrosis: Secondary | ICD-10-CM

## 2021-02-14 DIAGNOSIS — K920 Hematemesis: Secondary | ICD-10-CM

## 2021-02-14 LAB — RENAL FUNCTION PANEL
Albumin: 2.4 g/dL — ABNORMAL LOW (ref 3.5–5.0)
Anion gap: 7 (ref 5–15)
BUN: 32 mg/dL — ABNORMAL HIGH (ref 6–20)
CO2: 29 mmol/L (ref 22–32)
Calcium: 8.4 mg/dL — ABNORMAL LOW (ref 8.9–10.3)
Chloride: 101 mmol/L (ref 98–111)
Creatinine, Ser: 1.32 mg/dL — ABNORMAL HIGH (ref 0.44–1.00)
GFR, Estimated: 56 mL/min — ABNORMAL LOW (ref >60)
Glucose, Bld: 94 mg/dL (ref 70–99)
Phosphorus: 3.7 mg/dL (ref 2.5–4.6)
Potassium: 3.7 mmol/L (ref 3.5–5.1)
Sodium: 137 mmol/L (ref 135–145)

## 2021-02-14 LAB — CBC
HCT: 25.2 % — ABNORMAL LOW (ref 36.0–46.0)
Hemoglobin: 8.2 g/dL — ABNORMAL LOW (ref 12.0–15.0)
MCH: 31.1 pg (ref 26.0–34.0)
MCHC: 32.5 g/dL (ref 30.0–36.0)
MCV: 95.5 fL (ref 80.0–100.0)
Platelets: 173 10*3/uL (ref 150–400)
RBC: 2.64 MIL/uL — ABNORMAL LOW (ref 3.87–5.11)
RDW: 16.3 % — ABNORMAL HIGH (ref 11.5–15.5)
WBC: 6.9 10*3/uL (ref 4.0–10.5)
nRBC: 0 % (ref 0.0–0.2)

## 2021-02-14 LAB — MAGNESIUM: Magnesium: 2.2 mg/dL (ref 1.7–2.4)

## 2021-02-14 LAB — GLUCOSE, CAPILLARY: Glucose-Capillary: 107 mg/dL — ABNORMAL HIGH (ref 70–99)

## 2021-02-14 LAB — HEPARIN LEVEL (UNFRACTIONATED)
Heparin Unfractionated: >1.1 IU/mL — ABNORMAL HIGH (ref 0.30–0.70)
Heparin Unfractionated: 0.63 IU/mL (ref 0.30–0.70)

## 2021-02-14 NOTE — Progress Notes (Signed)
PROGRESS NOTE  Ashley Camacho CWC:[id number] DOB: Jan 15, 1991   PCP: Hildred Laser, MD  Patient is from: Home.  DOA: 12/16/2020 LOS: 60  Chief complaints:  No chief complaint on file.    Brief Narrative / Interim history: 30 y.o. G2P0101 with a history of T1DM, chronic HFrEF and recent hospitalization at [facility] on 6/30 for N/V and rhinovirus pneumonia requiring transfer to [facility].  She was discharged home from [facility] on 7/20 only to return on 7/22 with starvation ketoacidosis in the setting of nausea and vomiting likely due to uncontrolled DM-1 and gastroparesis, and brachial vein DVT.She was a started on TPN through PICC line and transferred to [facility] for cortrak and TF initiation.   Hospital course noteworthy of systolic CHF exacerbation (on IV diuretics), AI (p.o. Cortef), ABLA, hematemesis in the setting of grade C esophagitis on EGD, hematuria (seems to have resolved), moderate right hydronephrosis (improved), Enterococcus faecalis UTI (completed treatment), acute urinary retention (resolved)...  She is currently on IV Lasix.  Subjective: Seen and examined earlier this morning.  No major events overnight of this morning.  She has no complaints.  She denies chest pain, dyspnea, GI or UTI symptoms.  No further hematemesis or hematuria.  She denies melena or hematochezia.  Edema has improved.   Objective: Vitals:   02/13/21 2304 02/14/21 0330 02/14/21 0754 02/14/21 1138  BP: (!) 155/62 (!) 149/62 (!) 139/57 (!) 147/58  Pulse: 84 88 90 90  Resp: 18 17 18 18   Temp: 98.2 F (36.8 C) 98 F (36.7 C) 98.1 F (36.7 C)   TempSrc: Oral Oral Oral   SpO2: 97% 98% 99%   Weight:      Height:        Intake/Output Summary (Last 24 hours) at 02/14/2021 1248 Last data filed at 02/14/2021 1200 Gross per 24 hour  Intake 1422.21 ml  Output 4150 ml  Net -2727.79 ml   Filed Weights   01/24/21 0603 01/26/21 0530 02/08/21 1157  Weight: 81.7 kg 80.6 kg 76.1 kg    Examination:  GENERAL: No apparent  distress.  Nontoxic. HEENT: MMM.  Vision and hearing grossly intact.  NECK: Supple.  No apparent JVD.  RESP: 99% on RA.  No IWOB.  Fair aeration bilaterally. CVS:  RRR. Heart sounds normal.  ABD/GI/GU: BS+.  Gravid uterus. MSK/EXT:  Moves extremities. No apparent deformity.  2+ edema, L> R. SKIN: no apparent skin lesion or wound NEURO: Awake, alert and oriented appropriately.  No apparent focal neuro deficit. PSYCH: Calm. Normal affect.   Procedures:  8/24-EGD with grade C esophagitis.  Microbiology summarized: 7/30-wet prep negative. 7/30-urine culture with insignificant growth. 8/15-urine culture with pansensitive Enterococcus faecalis 8/22-C. difficile negative 9/6 & 7-urine culture with multiple species 9/9-urine culture negative  Assessment & Plan: Volume overload/acute systolic CHF with improved EF-significant lower extremity edema.  She denies cardiopulmonary symptoms.  Her anemia and poor nutritional status could be playing a role.  Tolerating IV Lasix 40 mg twice daily.  Excellent urine output.  Renal function stable. -Continue IV Lasix 40 mg twice daily -Closely monitor fluid status, renal functions and electrolytes -Cardiology signed off.  Intractable nausea/vomiting-multifactorial including possible gastroparesis from uncontrolled DM-1, adrenal insufficiency and esophagitis.  Seems to have resolved. 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  However, low cortisol level with minimal to no response to ACTH also suggests primary.  Notes that, ACTH level was drawn 2 days after stim test.  Other  pituitary function seems to be intact. -Continue Cortef 25 mg the morning and 50 mg in the evening for now -Needs stress dose IV Solu-Cortef perioperatively for surgical stress. -She needs evaluation by endocrinology outpatient.  Hematemesis could be due to grade C esophagitis as noted on EGD on 8/24.   Resolved. -Continue Protonix and Carafate   Gross hematuria, unclear etiology: In the setting of [facility] for RUE DVT?  Renal US with hydronephrosis.  H&H stable on anticoagulation now.. -Reportedly not eligible for cystoscopy due to gravid uterus -Monitor H&H  Acute urinary tension/bilateral hydronephrosis-moderate right and mild left.  Urinary tension resolved. -Closely monitor urine output  Controlled DM-1 with possible gastroparesis: A1c 5.8% on 7/1. Recent Labs  Lab 02/10/21 0517 02/11/21 0417 02/12/21 0459 02/13/21 0515 02/14/21 0538  GLUCAP 99 88 92 109* 107*  -On insulin drip and D10 infusion in the setting of poor p.o. intake -Continue Reglan  AKI/azotemia: Unclear etiology.  Seems to be improving with IV diuretics. Recent Labs    02/04/21 0530 02/05/21 0512 02/06/21 0423 02/08/21 0454 02/09/21 7169 02/10/21 0433 02/11/21 1112 02/12/21 0527 02/13/21 0523 02/14/21 0656  BUN 17 19 21* 22* 24* 27* 32* 32* 36* 32*  CREATININE 1.11* 1.13* 1.17* 1.21* 1.35* 1.30* 1.48* 1.39* 1.36* 1.32*  -Continue IV Lasix -Monitor urine output -Consider repeat renal ultrasound if worse.    Elevated liver enzymes: Total bili within normal.  Stable. Recent Labs  Lab 02/10/21 0433 02/10/21 1049 02/11/21 1112 02/12/21 0527 02/13/21 0523 02/14/21 0656  AST 29 34 43* 43* 43*  --   ALT 39 42 48* 48* 53*  --   ALKPHOS 78 78 85 83 77  --   BILITOT 0.5 0.6 0.6 0.7 0.6  --   PROT 4.8* 5.2* 5.1* 5.1* 5.1*  --   ALBUMIN 2.4* 2.5* 2.5* 2.4* 2.5* 2.4*    Essential hypertension: BP slightly high. -Continue Procardia, hydralazine and Lasix  ABLA in the setting of hematemesis and hematuria: Seems to have resolved.  H&H stable. Recent Labs    02/04/21 0530 02/05/21 0512 02/06/21 0423 02/08/21 0454 02/09/21 6789 02/10/21 0433 02/11/21 0420 02/12/21 0527 02/13/21 0523 02/14/21 0430  HGB 7.4* 7.8* 7.9* 8.1* 8.3* 8.1* 8.3* 7.9* 8.2* 8.2*  -Monitor  Chronic insomnia She is  currently on Unisom 12.5 mg twice daily   Acute thrombocytopenia: Resolved. Recent Labs  Lab 02/08/21 0454 02/09/21 0822 02/10/21 0433 02/11/21 0420 02/12/21 0527 02/13/21 0523 02/14/21 0430  PLT 129* 144* 156 173 161 166 173    Right brachial vein DVT: Dx 12/07/20: seems to be recannulating based on repeat U/S.  Continue heparin.  No signs of bleeding.   Hypokalemia/hypomagnesemia: Resolved.   Third trimester intrauterine pregnancy: G2 P0-1-0-1 -Seems cesarean section is a scheduled for 02/17/2021. -Per primary.   Enterococcus faecalis UTI-completed antibiotic course on 8/22.   Limited venous access:  - PICC reinserted 8/30.   Epistaxis/vaginal bleeding: Seems to have resolved.  Inadequate oral intake Body mass index is 31.71 kg/m. Nutrition Problem: Inadequate oral intake Etiology: nausea, vomiting Signs/Symptoms: NPO status Interventions: TPN, Tube feeding   DVT prophylaxis:  Place TED hose Start: 01/17/21 1655 Patient is on heparin drip for DVT.  Code Status: Full code Family Communication: Patient and/or RN. Available if any question.  Level of care: Antepartum Status is: Inpatient   Dispo: Per primary   Sch Meds:  Scheduled Meds:  sodium chloride   Intravenous Once   Chlorhexidine Gluconate Cloth  6 each Topical Daily  cholecalciferol  1,000 Units Oral Daily   vitamin B-6  12.5 mg Oral BID   And   doxylamine (Sleep)  12.5 mg Oral BID   DULoxetine  20 mg Oral Daily   famotidine  20 mg Oral BID   feeding supplement (GLUCERNA SHAKE)  237 mL Oral BID BM   furosemide  40 mg Intravenous BID   hydrALAZINE  75 mg Oral Q8H   hydrocortisone  25 mg Oral Q24H   And   hydrocortisone  15 mg Oral Q24H   mouth rinse  15 mL Mouth Rinse q12n4p   metoCLOPramide  5 mg Oral TID AC & HS   pantoprazole  40 mg Oral Daily   potassium chloride  40 mEq Oral Daily   prenatal vitamin w/FE, FA  1 tablet Oral Q1200   sodium chloride  1 spray Each Nare Q2H   sodium  chloride flush  10-40 mL Intracatheter Q12H   sucralfate  1 g Oral TID WC & HS   terbutaline  0.25 mg Subcutaneous Once   vitamin B-12  1,000 mcg Oral Daily   Continuous Infusions:  dextrose 25 mL/hr at 02/14/21 1244   heparin 1,000 Units/hr (02/14/21 0748)   insulin 3.4 Units/hr (02/14/21 1242)   PRN Meds:.acetaminophen, dextrose, diphenhydrAMINE-zinc acetate, Glycerin (Adult), NIFEdipine, ondansetron, prochlorperazine, senna, sodium chloride flush  Antimicrobials: Anti-infectives (From admission, onward)    Start     Dose/Rate Route Frequency Ordered Stop   01/30/21 1315  amoxicillin (AMOXIL) capsule 500 mg  Status:  Discontinued        500 mg Oral 2 times daily 01/30/21 1218 02/01/21 1426   01/30/21 1200  ampicillin (OMNIPEN) 2 g in sodium chloride 0.9 % 100 mL IVPB  Status:  Discontinued        2 g 300 mL/hr over 20 Minutes Intravenous Every 6 hours 01/30/21 1105 01/30/21 1218   01/06/21 1630  amoxicillin (AMOXIL) 250 MG/5ML suspension 500 mg        500 mg Per Tube Every 8 hours 01/06/21 1534 01/13/21 0602   01/06/21 1615  amoxicillin (AMOXIL) chewable tablet 500 mg  Status:  Discontinued        500 mg Oral Every 8 hours 01/06/21 1528 01/06/21 1532        I have personally reviewed the following labs and images: CBC: Recent Labs  Lab 02/10/21 0433 02/11/21 0420 02/12/21 0527 02/13/21 0523 02/14/21 0430  WBC 5.7 6.5 6.3 6.5 6.9  HGB 8.1* 8.3* 7.9* 8.2* 8.2*  HCT 25.1* 25.4* 24.7* 24.8* 25.2*  MCV 94.7 95.5 95.7 95.8 95.5  PLT 156 173 161 166 173   BMP &GFR Recent Labs  Lab 02/10/21 0433 02/10/21 1049 02/11/21 1112 02/12/21 0527 02/13/21 0523 02/14/21 0656  NA 133*  --  136 136 136 137  K 3.3*  --  3.8 3.7 3.5 3.7  CL 94*  --  98 98 98 101  CO2 28  --  29 29 30 29   GLUCOSE 357*  --  165* 98 99 94  BUN 27*  --  32* 32* 36* 32*  CREATININE 1.30*  --  1.48* 1.39* 1.36* 1.32*  CALCIUM 8.3*  --  8.4* 8.3* 8.4* 8.4*  MG  --  1.7  --   --   --  2.2  PHOS  --    --   --   --   --  3.7   Estimated Creatinine Clearance: 58.7 mL/min (A) (by C-G formula based on SCr of  1.32 mg/dL (H)). Liver & Pancreas: Recent Labs  Lab 02/10/21 0433 02/10/21 1049 02/11/21 1112 02/12/21 0527 02/13/21 0523 02/14/21 0656  AST 29 34 43* 43* 43*  --   ALT 39 42 48* 48* 53*  --   ALKPHOS 78 78 85 83 77  --   BILITOT 0.5 0.6 0.6 0.7 0.6  --   PROT 4.8* 5.2* 5.1* 5.1* 5.1*  --   ALBUMIN 2.4* 2.5* 2.5* 2.4* 2.5* 2.4*   No results for input(s): LIPASE, AMYLASE in the last 168 hours. No results for input(s): AMMONIA in the last 168 hours. Diabetic: No results for input(s): HGBA1C in the last 72 hours. Recent Labs  Lab 02/10/21 0517 02/11/21 0417 02/12/21 0459 02/13/21 0515 02/14/21 0538  GLUCAP 99 88 92 109* 107*   Cardiac Enzymes: No results for input(s): CKTOTAL, CKMB, CKMBINDEX, TROPONINI in the last 168 hours. No results for input(s): PROBNP in the last 8760 hours. Coagulation Profile: No results for input(s): INR, PROTIME in the last 168 hours. Thyroid Function Tests: No results for input(s): TSH, T4TOTAL, FREET4, T3FREE, THYROIDAB in the last 72 hours. Lipid Profile: No results for input(s): CHOL, HDL, LDLCALC, TRIG, CHOLHDL, LDLDIRECT in the last 72 hours. Anemia Panel: No results for input(s): VITAMINB12, FOLATE, FERRITIN, TIBC, IRON, RETICCTPCT in the last 72 hours. Urine analysis:    Component Value Date/Time   COLORURINE AMBER (A) 01/10/2021 0131   APPEARANCEUR CLOUDY (A) 01/10/2021 0131   APPEARANCEUR Clear 09/18/2020 1153   LABSPEC 1.017 01/10/2021 0131   LABSPEC 1.026 03/01/2013 1844   PHURINE 5.0 01/10/2021 0131   GLUCOSEU NEGATIVE 01/10/2021 0131   GLUCOSEU >=500 03/01/2013 1844   HGBUR NEGATIVE 01/10/2021 0131   BILIRUBINUR NEGATIVE 01/10/2021 0131   BILIRUBINUR neg 10/23/2020 0953   BILIRUBINUR Negative 09/18/2020 1153   BILIRUBINUR Negative 03/01/2013 1844   KETONESUR NEGATIVE 01/10/2021 0131   PROTEINUR NEGATIVE 01/10/2021  0131   UROBILINOGEN 0.2 10/23/2020 0953   NITRITE NEGATIVE 01/10/2021 0131   LEUKOCYTESUR SMALL (A) 01/10/2021 0131   LEUKOCYTESUR Negative 03/01/2013 1844   Sepsis Labs: Invalid input(s): PROCALCITONIN, LACTICIDVEN  Microbiology: No results found for this or any previous visit (from the past 240 hour(s)).  Radiology Studies: No results found.  60 minutes with more than 50% spent in reviewing records, counseling patient/family and coordinating care.   Jeannetta Cerutti T. Machi Whittaker [facility]  If 7PM-7AM, please contact night-coverage [email or web address] 02/14/2021, 12:48 PM

## 2021-02-14 NOTE — Progress Notes (Signed)
ANTICOAGULATION CONSULT NOTE - Follow Up Consult  Pharmacy Consult for heparin Indication:  right brachial VTE  No Known Allergies  Patient Measurements: Height: 5\' 1"  (154.9 cm) Weight: 76.1 kg (167 lb 12.8 oz) IBW/kg (Calculated) : 47.8 Heparin Dosing Weight: 64.8 kg  Vital Signs: Temp: 98 F (36.7 C) (09/24 0330) Temp Source: Oral (09/24 0330) BP: 149/62 (09/24 0330) Pulse Rate: 88 (09/24 0330)  Labs: Recent Labs    02/11/21 1112 02/11/21 1400 02/12/21 0527 02/13/21 0523 02/13/21 1257 02/14/21 0430 02/14/21 0641  HGB  --    < > 7.9* 8.2*  --  8.2*  --   HCT  --   --  24.7* 24.8*  --  25.2*  --   PLT  --   --  161 166  --  173  --   HEPARINUNFRC  --    < > 0.71* 0.76* 0.38 >1.10* 0.63  CREATININE 1.48*  --  1.39* 1.36*  --   --   --    < > = values in this interval not displayed.    Estimated Creatinine Clearance: 56.9 mL/min (A) (by C-G formula based on SCr of 1.36 mg/dL (H)).   Assessment: Heparin level this AM >1.10.  Given her recent levels and changes made, assumed this level was erroneous. Spoke with nurse and asked her to draw another level stat.  In the meantime, we did hold the drip just in case it was accurate.  Repeat level was 0.63.  Instructed nurse to restart the drip which had not been off for an hour yet.  Goal of Therapy:  Heparin level 0.3-0.7 units/ml Monitor platelets by anticoagulation protocol: Yes   Plan:  Continue heparin at current rate of 1000 units/hr Will recheck level in AM with labs Continue to monitor H&H and platelets  Evelette Hollern Scarlett 02/14/2021,7:22 AM

## 2021-02-14 NOTE — Progress Notes (Signed)
Patient ID: Ashley Camacho, female   DOB: [birth year], 30 y.o.   MRN: [id number] FACULTY PRACTICE ANTEPARTUM(COMPREHENSIVE) NOTE  Ashley Camacho is a 30 y.o. G2P0101 at [redacted]w[redacted]d by best clinical estimate who is admitted for complications of T1DM and gastroparesis.   Fetal presentation is cephalic. Length of Stay:  60  Days  ASSESSMENT: Active Problems:   Delivery with history of C-section   Acute kidney injury (HCC)   Hyperemesis   DKA, type 1, not at goal [facility])   Acute deep vein thrombosis (DVT) of brachial vein of right upper extremity (HCC)   Vitreous hemorrhage of right eye (HCC)   Gastroparesis due to DM (HCC)   Diabetic retinopathy (HCC)   Acute on chronic combined systolic and diastolic CHF (congestive heart failure) (HCC)   Type 1 diabetes mellitus affecting pregnancy in second trimester, antepartum   HFrEF (heart failure with reduced ejection fraction) (HCC)   Anemia during pregnancy   Pre-existing type 1 diabetes mellitus during pregnancy in third trimester   History of preterm delivery, currently pregnant   Leg edema   Acute esophagitis   [redacted] weeks gestation of pregnancy   PLAN: Plan for delivery at 34 weeks by RCS and BTL Continue endotool Heparin gtt on hold due to elevated level - per pharmacy For tx prior to surgery BP is not well controlled - has procardia prn  Subjective: Feels baby moving. No complaints Patient reports the fetal movement as active. Patient reports uterine contraction  activity as none. Patient reports  vaginal bleeding as none. Patient describes fluid per vagina as None.  Vitals:  Blood pressure (!) 149/62, pulse 88, temperature 98 F (36.7 C), temperature source Oral, resp. rate 17, height 5\' 1"  (1.549 m), weight 76.1 kg, SpO2 98 %. Physical Examination:  General appearance - alert, well appearing, and in no distress Chest - normal effort Abdomen - gravid, non-tender Fundal Height:  size equals dates Extremities: Homans sign is  negative, no sign of DVT  Membranes:intact  Fetal Monitoring:  Baseline: 145 bpm, Variability: Good {> 6 bpm), Accelerations: Reactive, and Decelerations: Absent  Labs:  Results for orders placed or performed during the hospital encounter of 12/16/20 (from the past 24 hour(s))  Heparin level (unfractionated)   Collection Time: 02/13/21 12:57 PM  Result Value Ref Range   Heparin Unfractionated 0.38 0.30 - 0.70 IU/mL  Type and screen [facility] Draw Type and Screen with other 5:00am labs   Collection Time: 02/13/21 12:57 PM  Result Value Ref Range   ABO/RH(D) A POS    Antibody Screen NEG    Sample Expiration      02/16/2021,2359 Performed at [facility], [address] [place], [place]   CBC   Collection Time: 02/14/21  4:30 AM  Result Value Ref Range   WBC 6.9 4.0 - 10.5 K/uL   RBC 2.64 (L) 3.87 - 5.11 MIL/uL   Hemoglobin 8.2 (L) 12.0 - 15.0 g/dL   HCT 02/16/21 (L) 54.6 - 56.8 %   MCV 95.5 80.0 - 100.0 fL   MCH 31.1 26.0 - 34.0 pg   MCHC 32.5 30.0 - 36.0 g/dL   RDW 12.7 (H) 51.7 - 00.1 %   Platelets 173 150 - 400 K/uL   nRBC 0.0 0.0 - 0.2 %  Heparin level (unfractionated)   Collection Time: 02/14/21  4:30 AM  Result Value Ref Range   Heparin Unfractionated >1.10 (H) 0.30 - 0.70 IU/mL  Glucose, capillary  Collection Time: 02/14/21  5:38 AM  Result Value Ref Range   Glucose-Capillary 107 (H) 70 - 99 mg/dL  Heparin level (unfractionated)   Collection Time: 02/14/21  6:41 AM  Result Value Ref Range   Heparin Unfractionated 0.63 0.30 - 0.70 IU/mL    Imaging Studies:    Last BPP 8/8  Medications:  Scheduled  sodium chloride   Intravenous Once   Chlorhexidine Gluconate Cloth  6 each Topical Daily   cholecalciferol  1,000 Units Oral Daily   vitamin B-6  12.5 mg Oral BID   And   doxylamine (Sleep)  12.5 mg Oral BID   DULoxetine  20 mg Oral Daily   famotidine  20 mg Oral BID   feeding supplement (GLUCERNA SHAKE)  237 mL Oral BID BM    furosemide  40 mg Intravenous BID   hydrALAZINE  75 mg Oral Q8H   hydrocortisone  25 mg Oral Q24H   And   hydrocortisone  15 mg Oral Q24H   mouth rinse  15 mL Mouth Rinse q12n4p   metoCLOPramide  5 mg Oral TID AC & HS   pantoprazole  40 mg Oral Daily   potassium chloride  40 mEq Oral Daily   prenatal vitamin w/FE, FA  1 tablet Oral Q1200   sodium chloride  1 spray Each Nare Q2H   sodium chloride flush  10-40 mL Intracatheter Q12H   sucralfate  1 g Oral TID WC & HS   terbutaline  0.25 mg Subcutaneous Once   vitamin B-12  1,000 mcg Oral Daily   I have reviewed the patient's current medications.   Reva Bores, MD 02/14/2021,7:44 AM

## 2021-02-15 LAB — COMPREHENSIVE METABOLIC PANEL
ALT: 59 U/L — ABNORMAL HIGH (ref 0–44)
AST: 45 U/L — ABNORMAL HIGH (ref 15–41)
Albumin: 2.5 g/dL — ABNORMAL LOW (ref 3.5–5.0)
Alkaline Phosphatase: 78 U/L (ref 38–126)
Anion gap: 7 (ref 5–15)
BUN: 32 mg/dL — ABNORMAL HIGH (ref 6–20)
CO2: 30 mmol/L (ref 22–32)
Calcium: 8.5 mg/dL — ABNORMAL LOW (ref 8.9–10.3)
Chloride: 99 mmol/L (ref 98–111)
Creatinine, Ser: 1.6 mg/dL — ABNORMAL HIGH (ref 0.44–1.00)
GFR, Estimated: 44 mL/min — ABNORMAL LOW (ref >60)
Glucose, Bld: 143 mg/dL — ABNORMAL HIGH (ref 70–99)
Potassium: 4 mmol/L (ref 3.5–5.1)
Sodium: 136 mmol/L (ref 135–145)
Total Bilirubin: 1 mg/dL (ref 0.3–1.2)
Total Protein: 5.2 g/dL — ABNORMAL LOW (ref 6.5–8.1)

## 2021-02-15 LAB — CBC
HCT: 26 % — ABNORMAL LOW (ref 36.0–46.0)
Hemoglobin: 8.6 g/dL — ABNORMAL LOW (ref 12.0–15.0)
MCH: 31.3 pg (ref 26.0–34.0)
MCHC: 33.1 g/dL (ref 30.0–36.0)
MCV: 94.5 fL (ref 80.0–100.0)
Platelets: 180 10*3/uL (ref 150–400)
RBC: 2.75 MIL/uL — ABNORMAL LOW (ref 3.87–5.11)
RDW: 16.2 % — ABNORMAL HIGH (ref 11.5–15.5)
WBC: 6.4 10*3/uL (ref 4.0–10.5)
nRBC: 0 % (ref 0.0–0.2)

## 2021-02-15 LAB — GLUCOSE, CAPILLARY
Glucose-Capillary: 113 mg/dL — ABNORMAL HIGH (ref 70–99)
Glucose-Capillary: 154 mg/dL — ABNORMAL HIGH (ref 70–99)

## 2021-02-15 LAB — PHOSPHORUS: Phosphorus: 3.4 mg/dL (ref 2.5–4.6)

## 2021-02-15 LAB — BRAIN NATRIURETIC PEPTIDE: B Natriuretic Peptide: 347 pg/mL — ABNORMAL HIGH (ref 0.0–100.0)

## 2021-02-15 LAB — MAGNESIUM: Magnesium: 2.1 mg/dL (ref 1.7–2.4)

## 2021-02-15 LAB — HEPARIN LEVEL (UNFRACTIONATED): Heparin Unfractionated: 0.43 IU/mL (ref 0.30–0.70)

## 2021-02-15 MED ORDER — FUROSEMIDE 10 MG/ML IJ SOLN: 40.0000 mg | Freq: Every day | INTRAMUSCULAR | Status: DC

## 2021-02-15 MED ORDER — NIFEDIPINE 10 MG PO CAPS: 10.0000 mg | ORAL_CAPSULE | Freq: Four times a day (QID) | ORAL | Status: DC | PRN

## 2021-02-15 MED ORDER — NIFEDIPINE ER OSMOTIC RELEASE 30 MG PO TB24
30.0000 mg | ORAL_TABLET | Freq: Every day | ORAL | Status: DC
  Administered 2021-02-15 – 2021-02-18 (×3): 30 mg via ORAL
  Filled 2021-02-15 (×3): qty 1

## 2021-02-15 NOTE — Progress Notes (Signed)
PROGRESS NOTE  Ashley Camacho:[id number] DOB: 1990-06-27   PCP: Hildred Laser, MD  Patient is from: Home.  DOA: 12/16/2020 LOS: 61  Chief complaints:  No chief complaint on file.    Brief Narrative / Interim history: 30 y.o. G2P0101 with a history of T1DM, chronic HFrEF and recent hospitalization at [facility] on 6/30 for N/V and rhinovirus pneumonia requiring transfer to [facility].  She was discharged home from [facility] on 7/20 only to return on 7/22 with starvation ketoacidosis in the setting of nausea and vomiting likely due to uncontrolled DM-1 and gastroparesis, and brachial vein DVT.She was a started on TPN through PICC line and transferred to [facility] for cortrak and TF initiation.   Hospital course noteworthy of systolic CHF exacerbation (on IV diuretics), AI (p.o. Cortef), ABLA, hematemesis in the setting of grade C esophagitis on EGD, hematuria (seems to have resolved), moderate right hydronephrosis (improved), Enterococcus faecalis UTI (completed treatment), acute urinary retention (resolved)...  She is currently on IV Lasix.  Plan for cesarean section on 02/17/2021.  She needs stress dose steroid.   Subjective: Seen and examined earlier this morning.  No major events overnight of this morning.  No complaints.  She denies chest pain, dyspnea, orthopnea, GI or UTI symptoms.  Objective: Vitals:   02/15/21 0750 02/15/21 0751 02/15/21 0818 02/15/21 1159  BP:  (!) 161/54 (!) 160/65 (!) 155/53  Pulse:  92 91 96  Resp:  18  18  Temp:  98.1 F (36.7 C)  98.1 F (36.7 C)  TempSrc:  Oral  Oral  SpO2: 98% 99%  99%  Weight:      Height:        Intake/Output Summary (Last 24 hours) at 02/15/2021 1237 Last data filed at 02/15/2021 1100 Gross per 24 hour  Intake 1595.1 ml  Output 4250 ml  Net -2654.9 ml   Filed Weights   01/24/21 0603 01/26/21 0530 02/08/21 1157  Weight: 81.7 kg 80.6 kg 76.1 kg    Examination:  GENERAL: No apparent distress.  Nontoxic. HEENT: MMM.  Vision and hearing  grossly intact.  NECK: Supple.  No apparent JVD.  RESP: 97% on RA at rest.  No IWOB.  Fair aeration bilaterally. CVS:  RRR. Heart sounds normal.  ABD/GI/GU: BS+. Abd soft, NTND.  MSK/EXT:  Moves extremities. No apparent deformity.  Trace bilateral edema.  SKIN: no apparent skin lesion or wound NEURO: Awake and alert. Oriented appropriately.  No apparent focal neuro deficit. PSYCH: Calm. Normal affect.   Procedures:  8/24-EGD with grade C esophagitis.  Microbiology summarized: 7/30-wet prep negative. 7/30-urine culture with insignificant growth. 8/15-urine culture with pansensitive Enterococcus faecalis 8/22-C. difficile negative 9/6 & 7-urine culture with multiple species 9/9-urine culture negative  Assessment & Plan: Volume overload/acute systolic CHF with improved EF-improved.  Denies cardiopulmonary symptoms.   About 5 L UOP/24 hours with IV Lasix.  Creatinine slightly up.  SBP in 150s to 160s -Decrease IV Lasix to 40 mg daily -Closely monitor fluid status, renal functions and electrolytes -Cardiology signed off.  Intractable nausea/vomiting-multifactorial including possible gastroparesis from uncontrolled DM-1, adrenal insufficiency and esophagitis.  Seems to have resolved. -Continue Reglan and Zofran -Treat treatable causes as below  Adrenal insufficiency: low cortisol level with minimal to no response to ACTH suggests primary AI.  ACTH level low but drawn after she started stress dose steroid. Other pituitary function seems to be intact. -Continue Cortef 25 mg the morning and 50 mg in the evening for now -Needs stress dose IV Solu-Cortef  perioperatively -Outpatient follow-up with endocrinology after discharge  Hematemesis could be due to grade C esophagitis as noted on EGD on 8/24.  Resolved. -Continue Protonix and Carafate   Gross hematuria, unclear etiology: In the setting of [facility] for RUE DVT?  Renal US with hydronephrosis.  H&H stable on anticoagulation  now.. -Reportedly not eligible for cystoscopy due to gravid uterus -Monitor H&H  Acute urinary tension/bilateral hydronephrosis-moderate right and mild left.  Urinary tension resolved. -Closely monitor urine output  Controlled DM-1 with possible gastroparesis: A1c 5.8% on 7/1. Recent Labs  Lab 02/12/21 0459 02/13/21 0515 02/14/21 0538 02/15/21 0101 02/15/21 0503  GLUCAP 92 109* 107* 113* 154*  -On insulin drip and D10 infusion in the setting of poor p.o. intake -Continue Reglan for possible gastroparesis  AKI/azotemia: Unclear etiology.  Creatinine slightly up today.  About 5 L UOP/24 hours. Recent Labs    02/05/21 0512 02/06/21 0423 02/08/21 0454 02/09/21 6294 02/10/21 0433 02/11/21 1112 02/12/21 0527 02/13/21 0523 02/14/21 0656 02/15/21 0500  BUN 19 21* 22* 24* 27* 32* 32* 36* 32* 32*  CREATININE 1.13* 1.17* 1.21* 1.35* 1.30* 1.48* 1.39* 1.36* 1.32* 1.60*  -Decreased IV Lasix to 40 mg daily -Monitor urine output  Elevated liver enzymes: Total bili within normal.  Stable. Recent Labs  Lab 02/10/21 1049 02/11/21 1112 02/12/21 0527 02/13/21 0523 02/14/21 0656 02/15/21 0500  AST 34 43* 43* 43*  --  45*  ALT 42 48* 48* 53*  --  59*  ALKPHOS 78 85 83 77  --  78  BILITOT 0.6 0.6 0.7 0.6  --  1.0  PROT 5.2* 5.1* 5.1* 5.1*  --  5.2*  ALBUMIN 2.5* 2.5* 2.4* 2.5* 2.4* 2.5*    Essential hypertension: SBP in 150s to 160s this morning -Added Procardia XL 30 mg daily -Continue hydralazine 75 mg 3 times daily -Procardia 10 mg as needed SBP > 160. -She should be able to tolerate this since we are cutting down on Lasix.  ABLA in the setting of hematemesis and hematuria: Seems to have resolved.  H&H stable. Recent Labs    02/05/21 0512 02/06/21 0423 02/08/21 0454 02/09/21 7654 02/10/21 0433 02/11/21 0420 02/12/21 0527 02/13/21 0523 02/14/21 0430 02/15/21 0500  HGB 7.8* 7.9* 8.1* 8.3* 8.1* 8.3* 7.9* 8.2* 8.2* 8.6*  -Agree with transfusion perioperatively   -Continue monitor  Chronic insomnia -On Unisom 12.5 mg twice daily   Acute thrombocytopenia: Resolved. Recent Labs  Lab 02/09/21 0822 02/10/21 0433 02/11/21 0420 02/12/21 0527 02/13/21 0523 02/14/21 0430 02/15/21 0500  PLT 144* 156 173 161 166 173 180    Right brachial vein DVT: Dx 12/07/20: seems to be recannulating based on repeat U/S.  -Continue heparin.  No signs of bleeding.   Hypokalemia/hypomagnesemia: Resolved.   Third trimester intrauterine pregnancy: G2 P0-1-0-1 -Seems cesarean section is a scheduled for 02/17/2021. -Per primary.   Enterococcus faecalis UTI-completed antibiotic course on 8/22.   Limited venous access:  - PICC reinserted 8/30.   Epistaxis/vaginal bleeding: Seems to have resolved.  Inadequate oral intake Body mass index is 31.71 kg/m. Nutrition Problem: Inadequate oral intake Etiology: nausea, vomiting Signs/Symptoms: NPO status Interventions: TPN, Tube feeding   DVT prophylaxis:  Place TED hose Start: 01/17/21 1655 Patient is on heparin drip for DVT.  Code Status: Full code Family Communication: Patient and/or RN. Available if any question.  Level of care: Antepartum Status is: Inpatient   Dispo: Per primary   Sch Meds:  Scheduled Meds:  sodium chloride   Intravenous Once  Chlorhexidine Gluconate Cloth  6 each Topical Daily   cholecalciferol  1,000 Units Oral Daily   vitamin B-6  12.5 mg Oral BID   And   doxylamine (Sleep)  12.5 mg Oral BID   DULoxetine  20 mg Oral Daily   famotidine  20 mg Oral BID   feeding supplement (GLUCERNA SHAKE)  237 mL Oral BID BM   [START ON 02/16/2021] furosemide  40 mg Intravenous Daily   hydrALAZINE  75 mg Oral Q8H   hydrocortisone  25 mg Oral Q24H   And   hydrocortisone  15 mg Oral Q24H   mouth rinse  15 mL Mouth Rinse q12n4p   metoCLOPramide  5 mg Oral TID AC & HS   pantoprazole  40 mg Oral Daily   potassium chloride  40 mEq Oral Daily   prenatal vitamin w/FE, FA  1 tablet Oral Q1200    sodium chloride  1 spray Each Nare Q2H   sodium chloride flush  10-40 mL Intracatheter Q12H   sucralfate  1 g Oral TID WC & HS   terbutaline  0.25 mg Subcutaneous Once   vitamin B-12  1,000 mcg Oral Daily   Continuous Infusions:  dextrose 25 mL/hr at 02/14/21 1244   heparin 1,000 Units/hr (02/14/21 1541)   insulin 3.4 Units/hr (02/15/21 1101)   PRN Meds:.acetaminophen, dextrose, diphenhydrAMINE-zinc acetate, Glycerin (Adult), NIFEdipine, ondansetron, prochlorperazine, senna, sodium chloride flush  Antimicrobials: Anti-infectives (From admission, onward)    Start     Dose/Rate Route Frequency Ordered Stop   01/30/21 1315  amoxicillin (AMOXIL) capsule 500 mg  Status:  Discontinued        500 mg Oral 2 times daily 01/30/21 1218 02/01/21 1426   01/30/21 1200  ampicillin (OMNIPEN) 2 g in sodium chloride 0.9 % 100 mL IVPB  Status:  Discontinued        2 g 300 mL/hr over 20 Minutes Intravenous Every 6 hours 01/30/21 1105 01/30/21 1218   01/06/21 1630  amoxicillin (AMOXIL) 250 MG/5ML suspension 500 mg        500 mg Per Tube Every 8 hours 01/06/21 1534 01/13/21 0602   01/06/21 1615  amoxicillin (AMOXIL) chewable tablet 500 mg  Status:  Discontinued        500 mg Oral Every 8 hours 01/06/21 1528 01/06/21 1532        I have personally reviewed the following labs and images: CBC: Recent Labs  Lab 02/11/21 0420 02/12/21 0527 02/13/21 0523 02/14/21 0430 02/15/21 0500  WBC 6.5 6.3 6.5 6.9 6.4  HGB 8.3* 7.9* 8.2* 8.2* 8.6*  HCT 25.4* 24.7* 24.8* 25.2* 26.0*  MCV 95.5 95.7 95.8 95.5 94.5  PLT 173 161 166 173 180   BMP &GFR Recent Labs  Lab 02/10/21 1049 02/11/21 1112 02/12/21 0527 02/13/21 0523 02/14/21 0656 02/15/21 0500  NA  --  136 136 136 137 136  K  --  3.8 3.7 3.5 3.7 4.0  CL  --  98 98 98 101 99  CO2  --  29 29 30 29 30   GLUCOSE  --  165* 98 99 94 143*  BUN  --  32* 32* 36* 32* 32*  CREATININE  --  1.48* 1.39* 1.36* 1.32* 1.60*  CALCIUM  --  8.4* 8.3* 8.4* 8.4*  8.5*  MG 1.7  --   --   --  2.2 2.1  PHOS  --   --   --   --  3.7 3.4   Estimated Creatinine Clearance: 48.4  mL/min (A) (by C-G formula based on SCr of 1.6 mg/dL (H)). Liver & Pancreas: Recent Labs  Lab 02/10/21 1049 02/11/21 1112 02/12/21 0527 02/13/21 0523 02/14/21 0656 02/15/21 0500  AST 34 43* 43* 43*  --  45*  ALT 42 48* 48* 53*  --  59*  ALKPHOS 78 85 83 77  --  78  BILITOT 0.6 0.6 0.7 0.6  --  1.0  PROT 5.2* 5.1* 5.1* 5.1*  --  5.2*  ALBUMIN 2.5* 2.5* 2.4* 2.5* 2.4* 2.5*   No results for input(s): LIPASE, AMYLASE in the last 168 hours. No results for input(s): AMMONIA in the last 168 hours. Diabetic: No results for input(s): HGBA1C in the last 72 hours. Recent Labs  Lab 02/12/21 0459 02/13/21 0515 02/14/21 0538 02/15/21 0101 02/15/21 0503  GLUCAP 92 109* 107* 113* 154*   Cardiac Enzymes: No results for input(s): CKTOTAL, CKMB, CKMBINDEX, TROPONINI in the last 168 hours. No results for input(s): PROBNP in the last 8760 hours. Coagulation Profile: No results for input(s): INR, PROTIME in the last 168 hours. Thyroid Function Tests: No results for input(s): TSH, T4TOTAL, FREET4, T3FREE, THYROIDAB in the last 72 hours. Lipid Profile: No results for input(s): CHOL, HDL, LDLCALC, TRIG, CHOLHDL, LDLDIRECT in the last 72 hours. Anemia Panel: No results for input(s): VITAMINB12, FOLATE, FERRITIN, TIBC, IRON, RETICCTPCT in the last 72 hours. Urine analysis:    Component Value Date/Time   COLORURINE AMBER (A) 01/10/2021 0131   APPEARANCEUR CLOUDY (A) 01/10/2021 0131   APPEARANCEUR Clear 09/18/2020 1153   LABSPEC 1.017 01/10/2021 0131   LABSPEC 1.026 03/01/2013 1844   PHURINE 5.0 01/10/2021 0131   GLUCOSEU NEGATIVE 01/10/2021 0131   GLUCOSEU >=500 03/01/2013 1844   HGBUR NEGATIVE 01/10/2021 0131   BILIRUBINUR NEGATIVE 01/10/2021 0131   BILIRUBINUR neg 10/23/2020 0953   BILIRUBINUR Negative 09/18/2020 1153   BILIRUBINUR Negative 03/01/2013 1844   KETONESUR  NEGATIVE 01/10/2021 0131   PROTEINUR NEGATIVE 01/10/2021 0131   UROBILINOGEN 0.2 10/23/2020 0953   NITRITE NEGATIVE 01/10/2021 0131   LEUKOCYTESUR SMALL (A) 01/10/2021 0131   LEUKOCYTESUR Negative 03/01/2013 1844   Sepsis Labs: Invalid input(s): PROCALCITONIN, LACTICIDVEN  Microbiology: No results found for this or any previous visit (from the past 240 hour(s)).  Radiology Studies: No results found.   Edell Mesenbrink T. Iliyah Bui [facility]  If 7PM-7AM, please contact night-coverage [email or web address] 02/15/2021, 12:37 PM

## 2021-02-15 NOTE — Progress Notes (Addendum)
FACULTY PRACTICE ANTEPARTUM PROGRESS NOTE  Ashley Camacho is a 30 y.o. G2P0101 at [redacted]w[redacted]d who is admitted for inpatient management of Type 1 DM complications and gastroparesis.  Estimated Date of Delivery: 03/30/21 Fetal presentation is cephalic.  Length of Stay:  61 Days. Admitted 12/16/2020  Subjective: Pt seen, doing well. No major changes from obstetric context Patient reports normal fetal movement.  She denies uterine contractions, denies bleeding and leaking of fluid per vagina. She has no HA/BV/RUQ pain.   Vitals:  Blood pressure (!) 160/65, pulse 91, temperature 98.1 F (36.7 C), temperature source Oral, resp. rate 18, height 5\' 1"  (1.549 m), weight 76.1 kg, SpO2 99 %. Physical Examination: CONSTITUTIONAL: Well-developed, well-nourished female in no acute distress.  HENT:  Normocephalic, atraumatic, External right and left ear normal. Oropharynx is clear and moist EYES: Conjunctivae and EOM are normal. Pupils are equal, round, and reactive to light. No scleral icterus.  NECK: Normal range of motion, supple, no masses. SKIN: Skin is warm and dry. No rash noted. Not diaphoretic. No erythema. No pallor. NEUROLGIC: Alert and oriented to person, place, and time. Normal reflexes, muscle tone coordination. No cranial nerve deficit noted. PSYCHIATRIC: Normal mood and affect. Normal behavior. Normal judgment and thought content. CARDIOVASCULAR: Normal heart rate noted, regular rhythm RESPIRATORY: Effort and breath sounds normal, no problems with respiration noted MUSCULOSKELETAL: Normal range of motion. No edema and no tenderness. ABDOMEN: Soft, nontender, nondistended, gravid. CERVIX: deferred  Fetal monitoring: pending  CBC Latest Ref Rng & Units 02/15/2021 02/14/2021 02/13/2021  WBC 4.0 - 10.5 K/uL 6.4 6.9 6.5  Hemoglobin 12.0 - 15.0 g/dL 02/15/2021) 1.6(X) 0.9(U)  Hematocrit 36.0 - 46.0 % 26.0(L) 25.2(L) 24.8(L)  Platelets 150 - 400 K/uL 180 173 166   CMP Latest Ref Rng & Units 02/15/2021  02/14/2021 02/13/2021  Glucose 70 - 99 mg/dL 02/15/2021) 94 99  BUN 6 - 20 mg/dL 409(W) 11(B) 14(N)  Creatinine 0.44 - 1.00 mg/dL 82(N) 5.62(Z) 3.08(M)  Sodium 135 - 145 mmol/L 136 137 136  Potassium 3.5 - 5.1 mmol/L 4.0 3.7 3.5  Chloride 98 - 111 mmol/L 99 101 98  CO2 22 - 32 mmol/L 30 29 30   Calcium 8.9 - 10.3 mg/dL 5.78(I) ) 6.9(G)  Total Protein 6.5 - 8.1 g/dL 5.2(L) - 5.1(L)  Total Bilirubin 0.3 - 1.2 mg/dL 1.0 - 0.6  Alkaline Phos 38 - 126 U/L 78 - 77  AST 15 - 41 U/L 45(H) - 43(H)  ALT 0 - 44 U/L 59(H) - 53(H)     I have reviewed the patient's current medications.  ASSESSMENT: Active Problems:   Acute kidney injury (HCC)   Acute deep vein thrombosis (DVT) of brachial vein of right upper extremity (HCC)   Vitreous hemorrhage of right eye (HCC)   Gastroparesis due to DM (HCC)   Diabetic retinopathy (HCC)   Acute on chronic combined systolic and diastolic CHF (congestive heart failure) (HCC)   Type 1 diabetes mellitus affecting pregnancy in second trimester, antepartum   Anemia during pregnancy   Pre-existing type 1 diabetes mellitus during pregnancy in third trimester   History of preterm delivery, currently pregnant   Leg edema   [redacted] weeks gestation of pregnancy   PLAN: - She is doing well today with no acute change. Anticipate delivery on 9/27 as planned. Mode of delivery is c-section/BTL (currently scheduled for 3pm) - Preop Plan:  - Stress dose steroids:  - POD0: 100mg  IV Q8h starting around time of surgery - POD1: 50mg  Q8h -  POD2: 50mg  Q12h - Return to usual dose  - Transfuse 1 unit PRBC 9/26  - Stop heparin drip 8 hours preop - check PTT on POD0 with other labs  - Confirmed cell saver in OR - Labs today overall stable from baseline - Continue to monitor blood pressure today  Continue routine antenatal care.   10/26, MD Faculty Attending, [facility]

## 2021-02-16 LAB — GLUCOSE, CAPILLARY: Glucose-Capillary: 124 mg/dL — ABNORMAL HIGH (ref 70–99)

## 2021-02-16 LAB — PREPARE RBC (CROSSMATCH)

## 2021-02-16 LAB — CBC
HCT: 25.3 % — ABNORMAL LOW (ref 36.0–46.0)
Hemoglobin: 8.2 g/dL — ABNORMAL LOW (ref 12.0–15.0)
MCH: 31.3 pg (ref 26.0–34.0)
MCHC: 32.4 g/dL (ref 30.0–36.0)
MCV: 96.6 fL (ref 80.0–100.0)
Platelets: 180 10*3/uL (ref 150–400)
RBC: 2.62 MIL/uL — ABNORMAL LOW (ref 3.87–5.11)
RDW: 16.2 % — ABNORMAL HIGH (ref 11.5–15.5)
WBC: 6.4 10*3/uL (ref 4.0–10.5)
nRBC: 0 % (ref 0.0–0.2)

## 2021-02-16 LAB — HEPARIN LEVEL (UNFRACTIONATED): Heparin Unfractionated: 0.3 IU/mL (ref 0.30–0.70)

## 2021-02-16 LAB — RENAL FUNCTION PANEL
Albumin: 2.5 g/dL — ABNORMAL LOW (ref 3.5–5.0)
Anion gap: 7 (ref 5–15)
BUN: 37 mg/dL — ABNORMAL HIGH (ref 6–20)
CO2: 29 mmol/L (ref 22–32)
Calcium: 8.6 mg/dL — ABNORMAL LOW (ref 8.9–10.3)
Chloride: 101 mmol/L (ref 98–111)
Creatinine, Ser: 1.7 mg/dL — ABNORMAL HIGH (ref 0.44–1.00)
GFR, Estimated: 41 mL/min — ABNORMAL LOW (ref >60)
Glucose, Bld: 126 mg/dL — ABNORMAL HIGH (ref 70–99)
Phosphorus: 3.1 mg/dL (ref 2.5–4.6)
Potassium: 4.1 mmol/L (ref 3.5–5.1)
Sodium: 137 mmol/L (ref 135–145)

## 2021-02-16 LAB — MAGNESIUM: Magnesium: 2.1 mg/dL (ref 1.7–2.4)

## 2021-02-16 MED ORDER — HYDROCORTISONE 5 MG PO TABS
15.0000 mg | ORAL_TABLET | ORAL | Status: DC
  Administered 2021-02-20 – 2021-02-23 (×4): 15 mg via ORAL
  Filled 2021-02-16 (×6): qty 1

## 2021-02-16 MED ORDER — SALINE SPRAY 0.65 % NA SOLN
1.0000 | NASAL | Status: DC | PRN
  Filled 2021-02-16: qty 44

## 2021-02-16 MED ORDER — HYDROCORTISONE 5 MG PO TABS
25.0000 mg | ORAL_TABLET | ORAL | Status: DC
  Administered 2021-02-20 – 2021-02-24 (×5): 25 mg via ORAL
  Filled 2021-02-16 (×5): qty 1

## 2021-02-16 MED ORDER — HYDROCORTISONE SOD SUC (PF) 100 MG IJ SOLR
100.0000 mg | Freq: Three times a day (TID) | INTRAMUSCULAR | Status: AC
Start: 2021-02-17 — End: 2021-02-17
  Administered 2021-02-17 (×3): 100 mg via INTRAVENOUS
  Filled 2021-02-16 (×3): qty 2

## 2021-02-16 MED ORDER — SODIUM CHLORIDE 0.9% IV SOLUTION: Freq: Once | INTRAVENOUS | Status: DC

## 2021-02-16 MED ORDER — HYDROCORTISONE SOD SUC (PF) 100 MG IJ SOLR
50.0000 mg | Freq: Two times a day (BID) | INTRAMUSCULAR | Status: AC
  Administered 2021-02-19 (×2): 50 mg via INTRAVENOUS
  Filled 2021-02-16 (×2): qty 1

## 2021-02-16 MED ORDER — HYDROCORTISONE SOD SUC (PF) 100 MG IJ SOLR
50.0000 mg | Freq: Three times a day (TID) | INTRAMUSCULAR | Status: AC
Start: 2021-02-18 — End: 2021-02-18
  Administered 2021-02-18 (×3): 50 mg via INTRAVENOUS
  Filled 2021-02-16 (×3): qty 1

## 2021-02-16 MED ORDER — SODIUM CHLORIDE 0.9 % IV SOLN
INTRAVENOUS | Status: DC | PRN
  Administered 2021-02-17: 250 mL via INTRAVENOUS

## 2021-02-16 NOTE — Progress Notes (Signed)
ANTICOAGULATION CONSULT NOTE - Follow Up Consult  Pharmacy Consult for heparin Indication:  right brachial VTE in pregnancy  No Known Allergies  Patient Measurements: Height: 5\' 1"  (154.9 cm) Weight: 76.1 kg (167 lb 12.8 oz) IBW/kg (Calculated) : 47.8 Heparin Dosing Weight: 64.8 kg   Vital Signs: Temp: 98.1 F (36.7 C) (09/26 1203) Temp Source: Oral (09/26 1203) BP: 159/60 (09/26 1203) Pulse Rate: 96 (09/26 1203)  Labs: Recent Labs    02/14/21 0430 02/14/21 0641 02/14/21 0656 02/15/21 0500 02/16/21 0319  HGB 8.2*  --   --  8.6* 8.2*  HCT 25.2*  --   --  26.0* 25.3*  PLT 173  --   --  180 180  HEPARINUNFRC >1.10* 0.63  --  0.43 0.30  CREATININE  --   --  1.32* 1.60* 1.70*    Estimated Creatinine Clearance: 45.6 mL/min (A) (by C-G formula based on SCr of 1.7 mg/dL (H)).   Medications:  Scheduled:   sodium chloride   Intravenous Once   Chlorhexidine Gluconate Cloth  6 each Topical Daily   cholecalciferol  1,000 Units Oral Daily   vitamin B-6  12.5 mg Oral BID   And   doxylamine (Sleep)  12.5 mg Oral BID   DULoxetine  20 mg Oral Daily   famotidine  20 mg Oral BID   feeding supplement (GLUCERNA SHAKE)  237 mL Oral BID BM   hydrALAZINE  75 mg Oral Q8H   hydrocortisone  15 mg Oral Q24H   [START ON 02/20/2021] hydrocortisone  25 mg Oral Q24H   And   [START ON 02/20/2021] hydrocortisone  15 mg Oral Q24H   [START ON 02/17/2021] hydrocortisone sod succinate (SOLU-CORTEF) inj  100 mg Intravenous Q8H   Followed by   02/19/2021 ON 02/18/2021] hydrocortisone sod succinate (SOLU-CORTEF) inj  50 mg Intravenous Q8H   Followed by   02/20/2021 ON 02/19/2021] hydrocortisone sod succinate (SOLU-CORTEF) inj  50 mg Intravenous Q12H   mouth rinse  15 mL Mouth Rinse q12n4p   metoCLOPramide  5 mg Oral TID AC & HS   NIFEdipine  30 mg Oral Daily   pantoprazole  40 mg Oral Daily   potassium chloride  40 mEq Oral Daily   prenatal vitamin w/FE, FA  1 tablet Oral Q1200   sodium chloride flush   10-40 mL Intracatheter Q12H   sucralfate  1 g Oral TID WC & HS   terbutaline  0.25 mg Subcutaneous Once   vitamin B-12  1,000 mcg Oral Daily   Infusions:   dextrose 25 mL/hr at 02/15/21 2006   heparin 1,000 Units/hr (02/15/21 2001)   insulin 3 Units/hr (02/16/21 1338)    Assessment: Ashley Camacho is a 30 YO female [redacted]w[redacted]d today on heparin for right brachial VTE. Heparin level this AM is 0.3 units/ml at heparin rate of 1000 units/hr. Hgb 8.2 mg/dL and platelets [redacted]w[redacted]d today. No s/sx of bleeding. Per MD note, planning for C-section and tubal ligation for 9/27 @ 15:00.  Goal of Therapy:  Heparin level 0.3-0.7 units/ml Monitor platelets by anticoagulation protocol: Yes   Plan:  Stop heparin drip ~ 6 hours prior to scheduled C-section and obtain APTT to ensure adequate drop prior to epidural. Pan to restart therapeutic enoxaparin at least 12 hours after surgery.   10/27 Ashley Camacho 02/16/2021,2:32 PM

## 2021-02-16 NOTE — Care Management (Signed)
CM following for needs.  Plan is for inpatient until delivery.  [facility] referral made by CM on 01/21/21 for outpatient follow up.  CM will continue to follow patient while inpatient and needs after delivery.   Gretchen Short RNC-MNN, BSN Transitions of Care [facility]

## 2021-02-16 NOTE — Progress Notes (Signed)
PROGRESS NOTE  TEMIMA KUTSCH WUX:[id number] DOB: Nov 11, 1990   PCP: Hildred Laser, MD  Patient is from: Home.  DOA: 12/16/2020 LOS: 62  Chief complaints:  No chief complaint on file.    Brief Narrative / Interim history: 30 y.o. G2P0101 with a history of T1DM, chronic HFrEF and recent hospitalization at [facility] on 6/30 for N/V and rhinovirus pneumonia requiring transfer to [facility].  She was discharged home from [facility] on 7/20 only to return on 7/22 with starvation ketoacidosis in the setting of nausea and vomiting likely due to uncontrolled DM-1 and gastroparesis, and brachial vein DVT.She was a started on TPN through PICC line and transferred to [facility] for cortrak and TF initiation.   Hospital course noteworthy of systolic CHF exacerbation (on IV diuretics), AI (p.o. Cortef), ABLA, hematemesis in the setting of grade C esophagitis on EGD, hematuria (seems to have resolved), moderate right hydronephrosis (improved), Enterococcus faecalis UTI (completed treatment), acute urinary retention (resolved)...  Plan for cesarean section on 02/17/2021.  She needs stress dose steroid.   Subjective: Seen and examined earlier this morning.  No major events overnight of this morning.  She has no complaints.  She denies shortness of breath, chest pain, dyspnea, orthopnea, GI or UTI symptoms.  Creatinine slightly up.  Last dose of Lasix yesterday morning.  Objective: Vitals:   02/15/21 2015 02/15/21 2335 02/16/21 0335 02/16/21 0736  BP: (!) 137/57 135/60 140/61 (!) 136/53  Pulse: 89 91 97 94  Resp: 18 18 18 16   Temp: 97.6 F (36.4 C) 97.9 F (36.6 C) 97.9 F (36.6 C) 98.1 F (36.7 C)  TempSrc: Oral Oral Oral Oral  SpO2: 98% 97% 96% 99%  Weight:      Height:        Intake/Output Summary (Last 24 hours) at 02/16/2021 1138 Last data filed at 02/16/2021 1100 Gross per 24 hour  Intake 1437.35 ml  Output 1800 ml  Net -362.65 ml   Filed Weights   01/24/21 0603 01/26/21 0530 02/08/21 1157  Weight: 81.7  kg 80.6 kg 76.1 kg    Examination  GENERAL: No apparent distress.  Nontoxic. HEENT: MMM.  Vision and hearing grossly intact.  NECK: Supple.  No apparent JVD.  RESP: 96% on RA.  No IWOB.  Fair aeration bilaterally. CVS:  RRR. Heart sounds normal.  ABD/GI/GU: BS+.  Gravid uterus. MSK/EXT:  Moves extremities. No apparent deformity.  Trace edema L>R SKIN: no apparent skin lesion or wound NEURO: Awake and alert. Oriented appropriately.  No apparent focal neuro deficit. PSYCH: Calm. Normal affect.   Procedures:  8/24-EGD with grade C esophagitis.  Microbiology summarized: 7/30-wet prep negative. 7/30-urine culture with insignificant growth. 8/15-urine culture with pansensitive Enterococcus faecalis 8/22-C. difficile negative 9/6 & 7-urine culture with multiple species 9/9-urine culture negative  Assessment & Plan: Volume overload/acute systolic CHF with improved EF-improved.  Denies cardiopulmonary symptoms.  About 2.6 L UOP/24 hours.  Net -22 L.  Cr slightly up.  SBP in 130s to 140s. -Stop IV Lasix-last dose the morning of 9/25 -Closely monitor fluid status, renal functions and electrolytes -Cardiology signed off.  Intractable nausea/vomiting-could be due to possible gastroparesis, AI, and esophagitis.  Resolved. -Treat treatable causes -Continue Reglan and Zofran as needed  Adrenal insufficiency: low cortisol level with minimal to no response to ACTH suggests primary AI.  ACTH level low but drawn after she started stress dose steroid.  -Continue Cortef 25 mg the morning and 50 mg in the evening -Needs stress dose IV Solu-Cortef perioperatively -Outpatient  follow-up with endocrinology after discharge  Hematemesis could be due to grade C esophagitis as noted on EGD on 8/24.  Resolved. -Continue Protonix and Carafate   Gross hematuria, unclear etiology:  Renal US with hydronephrosis.  Resolved and anticoagulation resumed.  H&H stable. -Reportedly not eligible for cystoscopy due  to gravid uterus -Monitor H&H  Acute urinary tension/bilateral hydronephrosis-moderate right and mild left.  Urinary tension resolved. -Closely monitor urine output  Controlled DM-1 with possible gastroparesis: A1c 5.8% on 7/1. Recent Labs  Lab 02/13/21 0515 02/14/21 0538 02/15/21 0101 02/15/21 0503 02/16/21 0437  GLUCAP 109* 107* 113* 154* 124*  -On insulin drip and D10 infusion in the setting of poor p.o. intake -Continue Reglan for possible gastroparesis  AKI/azotemia: Unclear etiology.  Creatinine slightly up again today.  Excellent UOP Recent Labs    02/06/21 0423 02/08/21 0454 02/09/21 0822 02/10/21 0433 02/11/21 1112 02/12/21 0527 02/13/21 0523 02/14/21 0656 02/15/21 0500 02/16/21 0319  BUN 21* 22* 24* 27* 32* 32* 36* 32* 32* 37*  CREATININE 1.17* 1.21* 1.35* 1.30* 1.48* 1.39* 1.36* 1.32* 1.60* 1.70*  -Discontinued IV Lasix. -Monitor urine output and renal function  Elevated liver enzymes: Total bili within normal.  Stable. Recent Labs  Lab 02/10/21 1049 02/11/21 1112 02/12/21 0527 02/13/21 0523 02/14/21 0656 02/15/21 0500 02/16/21 0319  AST 34 43* 43* 43*  --  45*  --   ALT 42 48* 48* 53*  --  59*  --   ALKPHOS 78 85 83 77  --  78  --   BILITOT 0.6 0.6 0.7 0.6  --  1.0  --   PROT 5.2* 5.1* 5.1* 5.1*  --  5.2*  --   ALBUMIN 2.5* 2.5* 2.4* 2.5* 2.4* 2.5* 2.5*    Essential hypertension: SBP in 130s to 140s.  Improved. -Added Procardia XL 30 mg daily on 9/25 -Continue hydralazine 75 mg 3 times daily -Procardia 10 mg as needed SBP > 160. -Discontinue IV Lasix  ABLA in the setting of hematemesis and hematuria: H&H relatively stable. Recent Labs    02/06/21 0423 02/08/21 0454 02/09/21 0822 02/10/21 0433 02/11/21 0420 02/12/21 0527 02/13/21 0523 02/14/21 0430 02/15/21 0500 02/16/21 0319  HGB 7.9* 8.1* 8.3* 8.1* 8.3* 7.9* 8.2* 8.2* 8.6* 8.2*  -Agree with transfusion perioperatively  -Continue monitor  Chronic insomnia -On Unisom 12.5 mg  twice daily   Acute thrombocytopenia: Resolved.   Right brachial vein DVT: Dx 12/07/20: seems to be recannulating based on repeat U/S.  -Continue heparin.  No signs of bleeding.   Hypokalemia/hypomagnesemia: Resolved. -Monitor   Third trimester intrauterine pregnancy: G2 P0-1-0-1 -Plan for C/S on 02/17/2021 by primary.   Enterococcus faecalis UTI-completed antibiotic course on 8/22.   Limited venous access:  - PICC reinserted 8/30.   Epistaxis/vaginal bleeding: Seems to have resolved.  Inadequate oral intake Body mass index is 31.71 kg/m. Nutrition Problem: Inadequate oral intake Etiology: nausea, vomiting Signs/Symptoms: NPO status Interventions: TPN, Tube feeding   DVT prophylaxis:  Place TED hose Start: 01/17/21 1655 Patient is on heparin drip for DVT.  Code Status: Full code Family Communication: Patient and/or RN. Available if any question.  Level of care: Antepartum Status is: Inpatient   Dispo: Per primary   Sch Meds:  Scheduled Meds:  sodium chloride   Intravenous Once   Chlorhexidine Gluconate Cloth  6 each Topical Daily   cholecalciferol  1,000 Units Oral Daily   vitamin B-6  12.5 mg Oral BID   And   doxylamine (Sleep)  12.5 mg  Oral BID   DULoxetine  20 mg Oral Daily   famotidine  20 mg Oral BID   feeding supplement (GLUCERNA SHAKE)  237 mL Oral BID BM   hydrALAZINE  75 mg Oral Q8H   hydrocortisone  25 mg Oral Q24H   And   hydrocortisone  15 mg Oral Q24H   [START ON 02/20/2021] hydrocortisone  25 mg Oral Q24H   And   [START ON 02/20/2021] hydrocortisone  15 mg Oral Q24H   [START ON 02/17/2021] hydrocortisone sod succinate (SOLU-CORTEF) inj  100 mg Intravenous Q8H   Followed by   Melene Muller ON 02/18/2021] hydrocortisone sod succinate (SOLU-CORTEF) inj  50 mg Intravenous Q8H   Followed by   Melene Muller ON 02/19/2021] hydrocortisone sod succinate (SOLU-CORTEF) inj  50 mg Intravenous Q12H   mouth rinse  15 mL Mouth Rinse q12n4p   metoCLOPramide  5 mg Oral TID AC  & HS   NIFEdipine  30 mg Oral Daily   pantoprazole  40 mg Oral Daily   potassium chloride  40 mEq Oral Daily   prenatal vitamin w/FE, FA  1 tablet Oral Q1200   sodium chloride flush  10-40 mL Intracatheter Q12H   sucralfate  1 g Oral TID WC & HS   terbutaline  0.25 mg Subcutaneous Once   vitamin B-12  1,000 mcg Oral Daily   Continuous Infusions:  dextrose 25 mL/hr at 02/15/21 2006   heparin 1,000 Units/hr (02/15/21 2001)   insulin 3.8 Units/hr (02/16/21 1041)   PRN Meds:.acetaminophen, dextrose, diphenhydrAMINE-zinc acetate, Glycerin (Adult), NIFEdipine, ondansetron, prochlorperazine, senna, sodium chloride, sodium chloride flush  Antimicrobials: Anti-infectives (From admission, onward)    Start     Dose/Rate Route Frequency Ordered Stop   01/30/21 1315  amoxicillin (AMOXIL) capsule 500 mg  Status:  Discontinued        500 mg Oral 2 times daily 01/30/21 1218 02/01/21 1426   01/30/21 1200  ampicillin (OMNIPEN) 2 g in sodium chloride 0.9 % 100 mL IVPB  Status:  Discontinued        2 g 300 mL/hr over 20 Minutes Intravenous Every 6 hours 01/30/21 1105 01/30/21 1218   01/06/21 1630  amoxicillin (AMOXIL) 250 MG/5ML suspension 500 mg        500 mg Per Tube Every 8 hours 01/06/21 1534 01/13/21 0602   01/06/21 1615  amoxicillin (AMOXIL) chewable tablet 500 mg  Status:  Discontinued        500 mg Oral Every 8 hours 01/06/21 1528 01/06/21 1532        I have personally reviewed the following labs and images: CBC: Recent Labs  Lab 02/12/21 0527 02/13/21 0523 02/14/21 0430 02/15/21 0500 02/16/21 0319  WBC 6.3 6.5 6.9 6.4 6.4  HGB 7.9* 8.2* 8.2* 8.6* 8.2*  HCT 24.7* 24.8* 25.2* 26.0* 25.3*  MCV 95.7 95.8 95.5 94.5 96.6  PLT 161 166 173 180 180   BMP &GFR Recent Labs  Lab 02/10/21 1049 02/11/21 1112 02/12/21 0527 02/13/21 0523 02/14/21 0656 02/15/21 0500 02/16/21 0319  NA  --    < > 136 136 137 136 137  K  --    < > 3.7 3.5 3.7 4.0 4.1  CL  --    < > 98 98 101 99 101   CO2  --    < > 29 30 29 30 29   GLUCOSE  --    < > 98 99 94 143* 126*  BUN  --    < > 32* 36* 32*  32* 37*  CREATININE  --    < > 1.39* 1.36* 1.32* 1.60* 1.70*  CALCIUM  --    < > 8.3* 8.4* 8.4* 8.5* 8.6*  MG 1.7  --   --   --  2.2 2.1 2.1  PHOS  --   --   --   --  3.7 3.4 3.1   < > = values in this interval not displayed.   Estimated Creatinine Clearance: 45.6 mL/min (A) (by C-G formula based on SCr of 1.7 mg/dL (H)). Liver & Pancreas: Recent Labs  Lab 02/10/21 1049 02/11/21 1112 02/12/21 0527 02/13/21 0523 02/14/21 0656 02/15/21 0500 02/16/21 0319  AST 34 43* 43* 43*  --  45*  --   ALT 42 48* 48* 53*  --  59*  --   ALKPHOS 78 85 83 77  --  78  --   BILITOT 0.6 0.6 0.7 0.6  --  1.0  --   PROT 5.2* 5.1* 5.1* 5.1*  --  5.2*  --   ALBUMIN 2.5* 2.5* 2.4* 2.5* 2.4* 2.5* 2.5*   No results for input(s): LIPASE, AMYLASE in the last 168 hours. No results for input(s): AMMONIA in the last 168 hours. Diabetic: No results for input(s): HGBA1C in the last 72 hours. Recent Labs  Lab 02/13/21 0515 02/14/21 0538 02/15/21 0101 02/15/21 0503 02/16/21 0437  GLUCAP 109* 107* 113* 154* 124*   Cardiac Enzymes: No results for input(s): CKTOTAL, CKMB, CKMBINDEX, TROPONINI in the last 168 hours. No results for input(s): PROBNP in the last 8760 hours. Coagulation Profile: No results for input(s): INR, PROTIME in the last 168 hours. Thyroid Function Tests: No results for input(s): TSH, T4TOTAL, FREET4, T3FREE, THYROIDAB in the last 72 hours. Lipid Profile: No results for input(s): CHOL, HDL, LDLCALC, TRIG, CHOLHDL, LDLDIRECT in the last 72 hours. Anemia Panel: No results for input(s): VITAMINB12, FOLATE, FERRITIN, TIBC, IRON, RETICCTPCT in the last 72 hours. Urine analysis:    Component Value Date/Time   COLORURINE AMBER (A) 01/10/2021 0131   APPEARANCEUR CLOUDY (A) 01/10/2021 0131   APPEARANCEUR Clear 09/18/2020 1153   LABSPEC 1.017 01/10/2021 0131   LABSPEC 1.026 03/01/2013 1844    PHURINE 5.0 01/10/2021 0131   GLUCOSEU NEGATIVE 01/10/2021 0131   GLUCOSEU >=500 03/01/2013 1844   HGBUR NEGATIVE 01/10/2021 0131   BILIRUBINUR NEGATIVE 01/10/2021 0131   BILIRUBINUR neg 10/23/2020 0953   BILIRUBINUR Negative 09/18/2020 1153   BILIRUBINUR Negative 03/01/2013 1844   KETONESUR NEGATIVE 01/10/2021 0131   PROTEINUR NEGATIVE 01/10/2021 0131   UROBILINOGEN 0.2 10/23/2020 0953   NITRITE NEGATIVE 01/10/2021 0131   LEUKOCYTESUR SMALL (A) 01/10/2021 0131   LEUKOCYTESUR Negative 03/01/2013 1844   Sepsis Labs: Invalid input(s): PROCALCITONIN, LACTICIDVEN  Microbiology: No results found for this or any previous visit (from the past 240 hour(s)).  Radiology Studies: No results found.   Jule Schlabach T. Geraldine Tesar [facility]  If 7PM-7AM, please contact night-coverage [email or web address] 02/16/2021, 11:38 AM

## 2021-02-16 NOTE — Anesthesia Preprocedure Evaluation (Addendum)
Anesthesia Evaluation  Patient identified by MRN, date of birth, ID band Patient awake    Reviewed: Allergy & Precautions, NPO status , Patient's Chart, lab work & pertinent test results  Airway Mallampati: II  TM Distance: >3 FB Neck ROM: Full    Dental no notable dental hx.    Pulmonary pneumonia, resolved,  Hospitalized June-July 2022 for rhinovirus pneumonia for which she was treated at [facility]   Pulmonary exam normal breath sounds clear to auscultation       Cardiovascular hypertension, Pt. on medications +CHF (chronic HF with recent improvement of LVEF to 50%) and + DVT (RUE brahcial DVT on lovenox- transitioned ot heparin gtt as inpatient)  Normal cardiovascular exam+ Valvular Problems/Murmurs (mild to mod MR) MR  Rhythm:Regular Rate:Normal  Echo 12/28/20: 1. A small pericardial effusion is present. The pericardial effusion is  posterior to the left ventricle. There is no evidence of cardiac  tamponade.  2. Left ventricular ejection fraction, by estimation, is 50 to 55%. The  left ventricle has low normal function.  3. Right ventricular systolic function is normal. The right ventricular  size is normal. There is normal pulmonary artery systolic pressure. The  estimated right ventricular systolic pressure is 34.4 mmHg.  4. The mitral valve is grossly normal. Mild to moderate mitral valve  regurgitation.  5. Tricuspid valve regurgitation is moderate.  6. The aortic valve is normal in structure. Aortic valve regurgitation is  not visualized.  7. The inferior vena cava is normal in size with <50% respiratory  variability, suggesting right atrial pressure of 8 mmHg.    Neuro/Psych PSYCHIATRIC DISORDERS Anxiety Depression  Neuromuscular disease (peripheral neuropathy 2/2 poorly controlled T2DM)    GI/Hepatic Gastroparesis 2/2 poorly controlled T1DM Malnourished 2/2 hyperemesis- on TPN Hx hematemesis in the setting of  grade C esophagitis on EGD   Endo/Other  diabetes, Well Controlled, Type 1, Insulin DependentHypothyroidism Last a1c 5.8 (from 12.7) adrenal insufficiency   Renal/GU Renal InsufficiencyRenal diseaseCr 1.7 B/L hydronephrosis, acute urinary retention (resolved)  negative genitourinary   Musculoskeletal negative musculoskeletal ROS (+)   Abdominal Normal abdominal exam  (+)   Peds  Hematology  (+) Blood dyscrasia, anemia , 8.2/25.3 on 9/26- 1 unit prbc in preparation for OR 9/27 plt 180 Heparin gtt to be stopped 8h preop, preop coags:    Anesthesia Other Findings   Reproductive/Obstetrics (+) Pregnancy 1 prior section                            Anesthesia Physical Anesthesia Plan  ASA: 4  Anesthesia Plan: Spinal   Post-op Pain Management:    Induction:   PONV Risk Score and Plan: 3 and Ondansetron, Dexamethasone and Treatment may vary due to age or medical condition  Airway Management Planned: Natural Airway and Nasal Cannula  Additional Equipment: None  Intra-op Plan:   Post-operative Plan:   Informed Consent: I have reviewed the patients History and Physical, chart, labs and discussed the procedure including the risks, benefits and alternatives for the proposed anesthesia with the patient or authorized representative who has indicated his/her understanding and acceptance.     Dental advisory given  Plan Discussed with: CRNA and Anesthesiologist  Anesthesia Plan Comments: (Plan per OB notes: - Stress dose steroids: POD0: 100mg  IV Q8h starting around time of surgery, POD1: 50mg  Q8h, POD2: 50mg  Q12h - Transfuse1 unit PRBC 9/26 - Stop heparin drip 8 hours preop- checkPTTon POD0 with other labs - Confirmedcell saver  in OR  Anes plan: Crossmatch for 2 units. Patient last ate at 0600. Right brachial DVT. Heparin gtt was d/c'd at 0630. Discussed patient with primary RN. Per RN, patient unable to take po meds without applesauce,  therefore she will need IV meds as needed. I made St[place], DO aware. Plan is to check a PTT at 1300. When normalized will plan for a spinal anesthetic.GETA/RSI as backup plan. Per primary team, patient will receive 1uPRBC at 0900. Insulin gtt is continuing. Discussed with primary RN that patient will need additional IV access prior to OR, plan is to call IV team to attempt access in left hand/wrist. [name], MD  )      Anesthesia Quick Evaluation

## 2021-02-16 NOTE — Progress Notes (Signed)
Physical Therapy Treatment Patient Details Name: Ashley Camacho MRN: [id number] DOB: 1990/08/07 Today's Date: 02/16/2021   History of Present Illness 30 y.o. 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 Medical history significant of [redacted]w[redacted]d on presentation, Estimated Date of Delivery: 03/30/21 by by 9 week ultrasound however trying to make it to 34w for planned c-section, Type 1 diabetic, Cardiomyopathy, chronic systolic heart failure, DVT in bilateral arm. H/o recent hospitalizations d/t hyperemesis and sepsis secondary to pneumonia.    PT Comments    Pt continues to improve her activity tolerance. Pt able to negotiate 3 steps with step to pattern and walk 350' pushing IV pole without a rest break. Pt planned to have c-section tomorrow. PT to return as able, as appropriate s/p c-section to re-assess pt mobility.    Recommendations for follow up therapy are one component of a multi-disciplinary discharge planning process, led by the attending physician.  Recommendations may be updated based on patient status, additional functional criteria and insurance authorization.  Follow Up Recommendations  No PT follow up (will re-asses s/p c-section)     Equipment Recommendations  None recommended by PT    Recommendations for Other Services       Precautions / Restrictions Precautions Precautions: Other (comment) Precaution Comments: swelling in LEs, pregnancy Restrictions Weight Bearing Restrictions: No     Mobility  Bed Mobility Overal bed mobility: Modified Independent Bed Mobility: Supine to Sit     Supine to sit: Modified independent (Device/Increase time);HOB elevated     General bed mobility comments: incr time    Transfers Overall transfer level: Modified independent Equipment used: None Transfers: Sit to/from Stand Sit to Stand: Modified independent  (Device/Increase time)         General transfer comment: incr time to rise  Ambulation/Gait Ambulation/Gait assistance: Modified independent (Device/Increase time);Supervision Gait Distance (Feet): 350 Feet Assistive device: IV Pole Gait Pattern/deviations: Step-through pattern;Wide base of support Gait velocity: decreased   General Gait Details: no rest break today, even did stairs in the middle, mild onset of fatigue   Stairs Stairs: Yes Stairs assistance: Min guard Stair Management: Forwards;Step to pattern Number of Stairs: 3 (limited by IV line) General stair comments: used R handrail to mimic home   Wheelchair Mobility    Modified Rankin (Stroke Patients Only)       Balance Overall balance assessment: Mild deficits observed, not formally tested                                          Cognition Arousal/Alertness: Awake/alert Behavior During Therapy: WFL for tasks assessed/performed Overall Cognitive Status: Within Functional Limits for tasks assessed                                 General Comments: improved spirits, excited for c-section and to meet her daughter      Exercises      General Comments General comments (skin integrity, edema, etc.): pt continues with bilat LE swelling      Pertinent Vitals/Pain Pain Assessment: No/denies pain    Home Living                      Prior Function  PT Goals (current goals can now be found in the care plan section) Acute Rehab PT Goals PT Goal Formulation: With patient Time For Goal Achievement: 03/04/21 Potential to Achieve Goals: Good Progress towards PT goals: Progressing toward goals    Frequency    Min 2X/week      PT Plan Current plan remains appropriate    Co-evaluation              AM-PAC PT "6 Clicks" Mobility   Outcome Measure  Help needed turning from your back to your side while in a flat bed without using bedrails?:  None Help needed moving from lying on your back to sitting on the side of a flat bed without using bedrails?: None Help needed moving to and from a bed to a chair (including a wheelchair)?: None Help needed standing up from a chair using your arms (e.g., wheelchair or bedside chair)?: None Help needed to walk in hospital room?: None Help needed climbing 3-5 steps with a railing? : A Little 6 Click Score: 23    End of Session   Activity Tolerance: Patient tolerated treatment well Patient left: in bed;with call bell/phone within reach (EOB) Nurse Communication: Mobility status PT Visit Diagnosis: Other abnormalities of gait and mobility (R26.89);Muscle weakness (generalized) (M62.81) Pain - Right/Left: Right Pain - part of body: Knee     Time: 8676-7209 PT Time Calculation (min) (ACUTE ONLY): 16 min  Charges:  $Gait Training: 8-22 mins                     Lewis Shock, PT, DPT Acute Rehabilitation Services Pager #: [phone] Office #: [phone]    Iona Hansen 02/16/2021, 2:53 PM

## 2021-02-16 NOTE — Progress Notes (Signed)
Patient ID: Ashley Camacho, female   DOB: 06/05/1990, 30 y.o.   MRN: [id number] FACULTY PRACTICE ANTEPARTUM(COMPREHENSIVE) NOTE  Ashley Camacho is a 30 y.o. [id number] at [redacted]w[redacted]d by early ultrasound who is admitted for inpatient management of Type 1 DM complications and gastroparesis. Fetal presentation is cephalic. Length of Stay:  62  Days  Subjective: Patient is doing well Patient reports the fetal movement as active. Patient reports uterine contraction  activity as none. Patient reports  vaginal bleeding as none. Patient describes fluid per vagina as None.  Vitals:  Blood pressure (!) 159/60, pulse 96, temperature 98.1 F (36.7 C), temperature source Oral, resp. rate 19, height 5\' 1"  (1.549 m), weight 76.1 kg, SpO2 99 %. Physical Examination:  General appearance - alert, well appearing, and in no distress Heart - normal rate and regular rhythm Abdomen - soft, nontender, nondistended Fundal Height:  size equals dates Cervical Exam: Not evaluated. . Extremities: extremities normal, atraumatic, no cyanosis or edema and Homans sign is negative, no sign of DVT with DTRs 2+ bilaterally Membranes:intact  Fetal Monitoring:   Fetal Heart Rate A   Mode External filed at 02/15/2021 1400  Baseline Rate (A) 135 bpm filed at 02/15/2021 1400  Variability 6-25 BPM filed at 02/15/2021 1400  Accelerations 15 x 15 filed at 02/15/2021 1400  Decelerations Variable filed at 02/15/2021 1400    Labs:  Results for orders placed or performed during the hospital encounter of 12/16/20 (from the past 24 hour(s))  CBC   Collection Time: 02/16/21  3:19 AM  Result Value Ref Range   WBC 6.4 4.0 - 10.5 K/uL   RBC 2.62 (L) 3.87 - 5.11 MIL/uL   Hemoglobin 8.2 (L) 12.0 - 15.0 g/dL   HCT 02/18/21 (L) 87.5 - 64.3 %   MCV 96.6 80.0 - 100.0 fL   MCH 31.3 26.0 - 34.0 pg   MCHC 32.4 30.0 - 36.0 g/dL   RDW 32.9 (H) 51.8 - 84.1 %   Platelets 180 150 - 400 K/uL   nRBC 0.0 0.0 - 0.2 %  Heparin level (unfractionated)    Collection Time: 02/16/21  3:19 AM  Result Value Ref Range   Heparin Unfractionated 0.30 0.30 - 0.70 IU/mL  Renal function panel   Collection Time: 02/16/21  3:19 AM  Result Value Ref Range   Sodium 137 135 - 145 mmol/L   Potassium 4.1 3.5 - 5.1 mmol/L   Chloride 101 98 - 111 mmol/L   CO2 29 22 - 32 mmol/L   Glucose, Bld 126 (H) 70 - 99 mg/dL   BUN 37 (H) 6 - 20 mg/dL   Creatinine, Ser 02/18/21 (H) 0.44 - 1.00 mg/dL   Calcium 8.6 (L) 8.9 - 10.3 mg/dL   Phosphorus 3.1 2.5 - 4.6 mg/dL   Albumin 2.5 (L) 3.5 - 5.0 g/dL   GFR, Estimated 41 (L) >60 mL/min   Anion gap 7 5 - 15  Magnesium   Collection Time: 02/16/21  3:19 AM  Result Value Ref Range   Magnesium 2.1 1.7 - 2.4 mg/dL  Glucose, capillary   Collection Time: 02/16/21  4:37 AM  Result Value Ref Range   Glucose-Capillary 124 (H) 70 - 99 mg/dL  Type and screen [facility] Draw Type and Screen with other 5:00am labs   Collection Time: 02/16/21 10:50 AM  Result Value Ref Range   ABO/RH(D) A POS    Antibody Screen NEG    Sample Expiration      02/19/2021,2359 Performed  at [facility], [address] [place], [place] [address]      Medications:  Scheduled  sodium chloride   Intravenous Once   Chlorhexidine Gluconate Cloth  6 each Topical Daily   cholecalciferol  1,000 Units Oral Daily   vitamin B-6  12.5 mg Oral BID   And   doxylamine (Sleep)  12.5 mg Oral BID   DULoxetine  20 mg Oral Daily   famotidine  20 mg Oral BID   feeding supplement (GLUCERNA SHAKE)  237 mL Oral BID BM   hydrALAZINE  75 mg Oral Q8H   hydrocortisone  15 mg Oral Q24H   [START ON 02/20/2021] hydrocortisone  25 mg Oral Q24H   And   [START ON 02/20/2021] hydrocortisone  15 mg Oral Q24H   [START ON 02/17/2021] hydrocortisone sod succinate (SOLU-CORTEF) inj  100 mg Intravenous Q8H   Followed by   Melene Muller ON 02/18/2021] hydrocortisone sod succinate (SOLU-CORTEF) inj  50 mg Intravenous Q8H   Followed by   Melene Muller ON 02/19/2021]  hydrocortisone sod succinate (SOLU-CORTEF) inj  50 mg Intravenous Q12H   mouth rinse  15 mL Mouth Rinse q12n4p   metoCLOPramide  5 mg Oral TID AC & HS   NIFEdipine  30 mg Oral Daily   pantoprazole  40 mg Oral Daily   potassium chloride  40 mEq Oral Daily   prenatal vitamin w/FE, FA  1 tablet Oral Q1200   sodium chloride flush  10-40 mL Intracatheter Q12H   sucralfate  1 g Oral TID WC & HS   terbutaline  0.25 mg Subcutaneous Once   vitamin B-12  1,000 mcg Oral Daily   I have reviewed the patient's current medications.  ASSESSMENT: Patient Active Problem List   Diagnosis Date Noted   [redacted] weeks gestation of pregnancy    Acute esophagitis    Leg edema    History of preterm delivery, currently pregnant    Pre-existing type 1 diabetes mellitus during pregnancy in third trimester    Anemia during pregnancy    Type 1 diabetes mellitus affecting pregnancy in second trimester, antepartum    HFrEF (heart failure with reduced ejection fraction) (HCC)    Acute deep vein thrombosis (DVT) of brachial vein of right upper extremity (HCC) 12/17/2020   Anxiety 12/17/2020   Diabetic retinopathy (HCC) 12/17/2020   Acute on chronic combined systolic and diastolic CHF (congestive heart failure) (HCC)    Hyperemesis 12/13/2020   DKA, type 1, not at goal [facility]) 12/13/2020   Sunburn 12/13/2020   Hypothyroidism 12/10/2020   Malnutrition (HCC) 12/10/2020   Vitreous hemorrhage of right eye (HCC) 12/10/2020   Pleural effusion associated with pulmonary infection 11/25/2020   Pneumonia affecting pregnancy 11/24/2020   Diabetes mellitus affecting pregnancy, second trimester 11/24/2020   Edema 11/24/2020   Decreased urine output 11/24/2020   Shortness of breath    Zinc deficiency    SOB (shortness of breath)    Social problem 11/21/2020   Nausea and vomiting during pregnancy prior to [redacted] weeks gestation 11/21/2020   Low serum vitamin B12    Delivery with history of C-section 11/20/2020   Absolute anemia     Acute kidney injury (HCC)    Nausea and vomiting during pregnancy 10/23/2020   History of premature delivery, currently pregnant, second trimester 10/13/2020   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 Anticipate delivery on 9/27 as planned. Mode of delivery is c-section/BTL (currently scheduled for 3pm) - Preop Plan:             - Stress dose steroids:  - POD0: 100mg  IV Q8h starting around time of surgery - POD1: 50mg  Q8h - POD2: 50mg  Q12h - Return to usual dose             - Transfuse 1 unit PRBC 9/26             - Stop heparin drip 8 hours preop - check PTT on POD0 with other labs             - Confirmed cell saver in OR  02/16/2021,3:14 PM

## 2021-02-17 ENCOUNTER — Inpatient Hospital Stay (HOSPITAL_COMMUNITY): Payer: Medicaid Other | Admitting: Anesthesiology

## 2021-02-17 ENCOUNTER — Encounter (HOSPITAL_COMMUNITY)
Admission: AD | Disposition: A | Discharge status: Home / Self Care | Payer: Self-pay | Source: Other Acute Inpatient Hospital | Attending: Internal Medicine

## 2021-02-17 ENCOUNTER — Encounter (HOSPITAL_COMMUNITY): Payer: Self-pay | Admitting: Internal Medicine

## 2021-02-17 DIAGNOSIS — O34211 Maternal care for low transverse scar from previous cesarean delivery: Secondary | ICD-10-CM

## 2021-02-17 DIAGNOSIS — O1092 Unspecified pre-existing hypertension complicating childbirth: Secondary | ICD-10-CM

## 2021-02-17 DIAGNOSIS — Z98891 History of uterine scar from previous surgery: Secondary | ICD-10-CM

## 2021-02-17 DIAGNOSIS — Z3A34 34 weeks gestation of pregnancy: Secondary | ICD-10-CM

## 2021-02-17 DIAGNOSIS — Z302 Encounter for sterilization: Secondary | ICD-10-CM

## 2021-02-17 DIAGNOSIS — O2402 Pre-existing diabetes mellitus, type 1, in childbirth: Secondary | ICD-10-CM

## 2021-02-17 LAB — MAGNESIUM: Magnesium: 2.3 mg/dL (ref 1.7–2.4)

## 2021-02-17 LAB — RENAL FUNCTION PANEL
Albumin: 2.5 g/dL — ABNORMAL LOW (ref 3.5–5.0)
Anion gap: 6 (ref 5–15)
BUN: 36 mg/dL — ABNORMAL HIGH (ref 6–20)
CO2: 27 mmol/L (ref 22–32)
Calcium: 8.4 mg/dL — ABNORMAL LOW (ref 8.9–10.3)
Chloride: 102 mmol/L (ref 98–111)
Creatinine, Ser: 1.49 mg/dL — ABNORMAL HIGH (ref 0.44–1.00)
GFR, Estimated: 48 mL/min — ABNORMAL LOW (ref >60)
Glucose, Bld: 261 mg/dL — ABNORMAL HIGH (ref 70–99)
Phosphorus: 3.1 mg/dL (ref 2.5–4.6)
Potassium: 4.3 mmol/L (ref 3.5–5.1)
Sodium: 135 mmol/L (ref 135–145)

## 2021-02-17 LAB — APTT: aPTT: 28 seconds (ref 24–36)

## 2021-02-17 LAB — GLUCOSE, CAPILLARY
Glucose-Capillary: 113 mg/dL — ABNORMAL HIGH (ref 70–99)
Glucose-Capillary: 75 mg/dL (ref 70–99)

## 2021-02-17 LAB — HEPARIN LEVEL (UNFRACTIONATED): Heparin Unfractionated: 0.36 IU/mL (ref 0.30–0.70)

## 2021-02-17 LAB — CBC
HCT: 24.3 % — ABNORMAL LOW (ref 36.0–46.0)
Hemoglobin: 7.9 g/dL — ABNORMAL LOW (ref 12.0–15.0)
MCH: 31.3 pg (ref 26.0–34.0)
MCHC: 32.5 g/dL (ref 30.0–36.0)
MCV: 96.4 fL (ref 80.0–100.0)
Platelets: 161 10*3/uL (ref 150–400)
RBC: 2.52 MIL/uL — ABNORMAL LOW (ref 3.87–5.11)
RDW: 16.3 % — ABNORMAL HIGH (ref 11.5–15.5)
WBC: 7.4 10*3/uL (ref 4.0–10.5)
nRBC: 0 % (ref 0.0–0.2)

## 2021-02-17 LAB — RESP PANEL BY RT-PCR (FLU A&B, COVID) ARPGX2
Influenza A by PCR: NEGATIVE
Influenza B by PCR: NEGATIVE
SARS Coronavirus 2 by RT PCR: NEGATIVE

## 2021-02-17 SURGERY — Surgical Case
Anesthesia: Spinal

## 2021-02-17 MED ORDER — DROPERIDOL 2.5 MG/ML IJ SOLN: 0.6250 mg | Freq: Once | INTRAMUSCULAR | Status: DC | PRN

## 2021-02-17 MED ORDER — PHENYLEPHRINE HCL-NACL 20-0.9 MG/250ML-% IV SOLN
INTRAVENOUS | Status: DC | PRN
  Administered 2021-02-17: 60 ug/min via INTRAVENOUS

## 2021-02-17 MED ORDER — FENTANYL CITRATE (PF) 100 MCG/2ML IJ SOLN
INTRAMUSCULAR | Status: DC | PRN
  Administered 2021-02-17: 15 ug via INTRATHECAL

## 2021-02-17 MED ORDER — HYDRALAZINE HCL 20 MG/ML IJ SOLN
5.0000 mg | Freq: Once | INTRAMUSCULAR | Status: AC
  Administered 2021-02-17: 5 mg via INTRAVENOUS
  Filled 2021-02-17: qty 1

## 2021-02-17 MED ORDER — OXYCODONE HCL 5 MG PO TABS
5.0000 mg | ORAL_TABLET | ORAL | Status: DC | PRN
  Administered 2021-02-18 – 2021-02-20 (×5): 5 mg via ORAL
  Administered 2021-02-21 – 2021-02-25 (×9): 10 mg via ORAL
  Administered 2021-02-26: 5 mg via ORAL
  Administered 2021-02-27 – 2021-03-01 (×5): 10 mg via ORAL
  Administered 2021-03-01 – 2021-03-02 (×2): 5 mg via ORAL
  Administered 2021-03-03 – 2021-03-04 (×2): 10 mg via ORAL
  Filled 2021-02-17 (×2): qty 1
  Filled 2021-02-17 (×2): qty 2
  Filled 2021-02-17: qty 1
  Filled 2021-02-17: qty 2
  Filled 2021-02-17: qty 1
  Filled 2021-02-17 (×9): qty 2
  Filled 2021-02-17: qty 1
  Filled 2021-02-17: qty 2
  Filled 2021-02-17: qty 1
  Filled 2021-02-17: qty 2
  Filled 2021-02-17 (×2): qty 1
  Filled 2021-02-17 (×3): qty 2
  Filled 2021-02-17: qty 1

## 2021-02-17 MED ORDER — MEPERIDINE HCL 25 MG/ML IJ SOLN: 6.2500 mg | INTRAMUSCULAR | Status: DC | PRN

## 2021-02-17 MED ORDER — HYDROMORPHONE HCL 1 MG/ML IJ SOLN
0.5000 mg | INTRAMUSCULAR | Status: DC | PRN
  Administered 2021-02-17: 0.5 mg via INTRAVENOUS

## 2021-02-17 MED ORDER — ACETAMINOPHEN 500 MG PO TABS
1000.0000 mg | ORAL_TABLET | Freq: Four times a day (QID) | ORAL | Status: DC
  Administered 2021-02-17 – 2021-02-23 (×20): 1000 mg via ORAL
  Filled 2021-02-17 (×22): qty 2

## 2021-02-17 MED ORDER — MIDAZOLAM HCL 2 MG/2ML IJ SOLN
INTRAMUSCULAR | Status: AC
  Filled 2021-02-17: qty 2

## 2021-02-17 MED ORDER — FENTANYL CITRATE (PF) 100 MCG/2ML IJ SOLN
INTRAMUSCULAR | Status: AC
  Filled 2021-02-17: qty 2

## 2021-02-17 MED ORDER — NALOXONE HCL 4 MG/10ML IJ SOLN
1.0000 ug/kg/h | INTRAVENOUS | Status: DC | PRN
  Filled 2021-02-17: qty 5

## 2021-02-17 MED ORDER — NALBUPHINE HCL 10 MG/ML IJ SOLN: 5.0000 mg | INTRAMUSCULAR | Status: DC | PRN

## 2021-02-17 MED ORDER — ALBUMIN HUMAN 5 % IV SOLN
INTRAVENOUS | Status: AC
  Filled 2021-02-17: qty 250

## 2021-02-17 MED ORDER — FENTANYL CITRATE (PF) 100 MCG/2ML IJ SOLN: 25.0000 ug | INTRAMUSCULAR | Status: DC | PRN

## 2021-02-17 MED ORDER — TETANUS-DIPHTH-ACELL PERTUSSIS 5-2.5-18.5 LF-MCG/0.5 IM SUSY: 0.5000 mL | PREFILLED_SYRINGE | Freq: Once | INTRAMUSCULAR | Status: DC

## 2021-02-17 MED ORDER — BUPIVACAINE HCL (PF) 0.5 % IJ SOLN
INTRAMUSCULAR | Status: AC
  Filled 2021-02-17: qty 30

## 2021-02-17 MED ORDER — DEXMEDETOMIDINE (PRECEDEX) IN NS 20 MCG/5ML (4 MCG/ML) IV SYRINGE
PREFILLED_SYRINGE | INTRAVENOUS | Status: DC | PRN
  Administered 2021-02-17: 8 ug via INTRAVENOUS

## 2021-02-17 MED ORDER — PRENATAL MULTIVITAMIN CH
1.0000 | ORAL_TABLET | Freq: Every day | ORAL | Status: DC
  Administered 2021-02-18 – 2021-03-04 (×15): 1 via ORAL
  Filled 2021-02-17 (×15): qty 1

## 2021-02-17 MED ORDER — COCONUT OIL OIL: 1.0000 "application " | TOPICAL_OIL | Status: DC | PRN

## 2021-02-17 MED ORDER — NALBUPHINE HCL 10 MG/ML IJ SOLN: 5.0000 mg | Freq: Once | INTRAMUSCULAR | Status: DC | PRN

## 2021-02-17 MED ORDER — FENTANYL CITRATE (PF) 100 MCG/2ML IJ SOLN
INTRAMUSCULAR | Status: DC | PRN
  Administered 2021-02-17: 85 ug via INTRAVENOUS
  Administered 2021-02-17 (×2): 50 ug via INTRAVENOUS

## 2021-02-17 MED ORDER — PHENYLEPHRINE 40 MCG/ML (10ML) SYRINGE FOR IV PUSH (FOR BLOOD PRESSURE SUPPORT)
PREFILLED_SYRINGE | INTRAVENOUS | Status: DC | PRN
  Administered 2021-02-17: 40 ug via INTRAVENOUS

## 2021-02-17 MED ORDER — BUPIVACAINE HCL (PF) 0.5 % IJ SOLN
INTRAMUSCULAR | Status: DC | PRN
  Administered 2021-02-17: 30 mL

## 2021-02-17 MED ORDER — PHENYLEPHRINE HCL (PRESSORS) 10 MG/ML IV SOLN
INTRAVENOUS | Status: DC | PRN
  Administered 2021-02-17 (×4): 40 ug via INTRAVENOUS

## 2021-02-17 MED ORDER — OXYTOCIN-SODIUM CHLORIDE 30-0.9 UT/500ML-% IV SOLN
INTRAVENOUS | Status: AC
  Filled 2021-02-17: qty 500

## 2021-02-17 MED ORDER — OXYTOCIN-SODIUM CHLORIDE 30-0.9 UT/500ML-% IV SOLN: 2.5000 [IU]/h | INTRAVENOUS | Status: AC

## 2021-02-17 MED ORDER — ACETAMINOPHEN 10 MG/ML IV SOLN: 1000.0000 mg | Freq: Once | INTRAVENOUS | Status: DC | PRN

## 2021-02-17 MED ORDER — KETAMINE HCL 10 MG/ML IJ SOLN
INTRAMUSCULAR | Status: DC | PRN
  Administered 2021-02-17: 20 mg via INTRAVENOUS

## 2021-02-17 MED ORDER — MENTHOL 3 MG MT LOZG: 1.0000 | LOZENGE | OROMUCOSAL | Status: DC | PRN

## 2021-02-17 MED ORDER — MEASLES, MUMPS & RUBELLA VAC IJ SOLR
0.5000 mL | Freq: Once | INTRAMUSCULAR | Status: DC
Start: 2021-02-18 — End: 2021-03-05

## 2021-02-17 MED ORDER — LACTATED RINGERS IV SOLN: INTRAVENOUS | Status: DC | PRN

## 2021-02-17 MED ORDER — MIDAZOLAM HCL 2 MG/2ML IJ SOLN
INTRAMUSCULAR | Status: DC | PRN
  Administered 2021-02-17: 2 mg via INTRAVENOUS

## 2021-02-17 MED ORDER — WITCH HAZEL-GLYCERIN EX PADS: 1.0000 "application " | MEDICATED_PAD | CUTANEOUS | Status: DC | PRN

## 2021-02-17 MED ORDER — ACETAMINOPHEN 10 MG/ML IV SOLN
INTRAVENOUS | Status: AC
  Filled 2021-02-17: qty 100

## 2021-02-17 MED ORDER — OXYCODONE-ACETAMINOPHEN 5-325 MG PO TABS
2.0000 | ORAL_TABLET | ORAL | Status: DC | PRN
  Filled 2021-02-17: qty 2

## 2021-02-17 MED ORDER — ACETAMINOPHEN 10 MG/ML IV SOLN
INTRAVENOUS | Status: DC | PRN
  Administered 2021-02-17: 1000 mg via INTRAVENOUS

## 2021-02-17 MED ORDER — ONDANSETRON HCL 4 MG/2ML IJ SOLN
INTRAMUSCULAR | Status: DC | PRN
  Administered 2021-02-17: 4 mg via INTRAVENOUS

## 2021-02-17 MED ORDER — MORPHINE SULFATE (PF) 0.5 MG/ML IJ SOLN
INTRAMUSCULAR | Status: AC
  Filled 2021-02-17: qty 10

## 2021-02-17 MED ORDER — DIBUCAINE (PERIANAL) 1 % EX OINT: 1.0000 "application " | TOPICAL_OINTMENT | CUTANEOUS | Status: DC | PRN

## 2021-02-17 MED ORDER — SCOPOLAMINE 1 MG/3DAYS TD PT72
1.0000 | MEDICATED_PATCH | Freq: Once | TRANSDERMAL | Status: AC
  Administered 2021-02-17: 1.5 mg via TRANSDERMAL

## 2021-02-17 MED ORDER — PHENYLEPHRINE HCL-NACL 20-0.9 MG/250ML-% IV SOLN
INTRAVENOUS | Status: AC
  Filled 2021-02-17: qty 250

## 2021-02-17 MED ORDER — DIPHENHYDRAMINE HCL 25 MG PO CAPS: 25.0000 mg | ORAL_CAPSULE | Freq: Four times a day (QID) | ORAL | Status: DC | PRN

## 2021-02-17 MED ORDER — SODIUM CHLORIDE 0.9% FLUSH: 3.0000 mL | INTRAVENOUS | Status: DC | PRN

## 2021-02-17 MED ORDER — ENOXAPARIN SODIUM 60 MG/0.6ML IJ SOSY
60.0000 mg | PREFILLED_SYRINGE | Freq: Two times a day (BID) | INTRAMUSCULAR | Status: DC
  Administered 2021-02-18 – 2021-02-21 (×5): 60 mg via SUBCUTANEOUS
  Filled 2021-02-17 (×5): qty 0.6

## 2021-02-17 MED ORDER — SCOPOLAMINE 1 MG/3DAYS TD PT72
MEDICATED_PATCH | TRANSDERMAL | Status: AC
  Filled 2021-02-17: qty 1

## 2021-02-17 MED ORDER — PROMETHAZINE HCL 25 MG/ML IJ SOLN: 6.2500 mg | INTRAMUSCULAR | Status: DC | PRN

## 2021-02-17 MED ORDER — SENNOSIDES-DOCUSATE SODIUM 8.6-50 MG PO TABS
2.0000 | ORAL_TABLET | Freq: Every day | ORAL | Status: DC
  Administered 2021-02-18: 2 via ORAL
  Filled 2021-02-17: qty 2

## 2021-02-17 MED ORDER — ONDANSETRON HCL 4 MG/2ML IJ SOLN
4.0000 mg | Freq: Three times a day (TID) | INTRAMUSCULAR | Status: DC | PRN
  Administered 2021-02-17: 4 mg via INTRAVENOUS
  Filled 2021-02-17: qty 2

## 2021-02-17 MED ORDER — DEXMEDETOMIDINE (PRECEDEX) IN NS 20 MCG/5ML (4 MCG/ML) IV SYRINGE
PREFILLED_SYRINGE | INTRAVENOUS | Status: AC
  Filled 2021-02-17: qty 10

## 2021-02-17 MED ORDER — STERILE WATER FOR IRRIGATION IR SOLN
Status: DC | PRN
  Administered 2021-02-17: 1

## 2021-02-17 MED ORDER — ZOLPIDEM TARTRATE 5 MG PO TABS: 5.0000 mg | ORAL_TABLET | Freq: Every evening | ORAL | Status: DC | PRN

## 2021-02-17 MED ORDER — OXYCODONE HCL 5 MG/5ML PO SOLN: 5.0000 mg | Freq: Once | ORAL | Status: DC | PRN

## 2021-02-17 MED ORDER — PHENYLEPHRINE 40 MCG/ML (10ML) SYRINGE FOR IV PUSH (FOR BLOOD PRESSURE SUPPORT)
PREFILLED_SYRINGE | INTRAVENOUS | Status: AC
  Filled 2021-02-17: qty 10

## 2021-02-17 MED ORDER — ONDANSETRON HCL 4 MG/2ML IJ SOLN
INTRAMUSCULAR | Status: AC
  Filled 2021-02-17: qty 2

## 2021-02-17 MED ORDER — CHLOROPROCAINE HCL (PF) 3 % IJ SOLN
INTRAMUSCULAR | Status: AC
  Filled 2021-02-17: qty 20

## 2021-02-17 MED ORDER — OXYCODONE HCL 5 MG PO TABS: 5.0000 mg | ORAL_TABLET | Freq: Once | ORAL | Status: DC | PRN

## 2021-02-17 MED ORDER — SIMETHICONE 80 MG PO CHEW
80.0000 mg | CHEWABLE_TABLET | Freq: Three times a day (TID) | ORAL | Status: DC
  Administered 2021-02-18 – 2021-03-04 (×43): 80 mg via ORAL
  Filled 2021-02-17 (×43): qty 1

## 2021-02-17 MED ORDER — MORPHINE SULFATE (PF) 0.5 MG/ML IJ SOLN
INTRAMUSCULAR | Status: DC | PRN
  Administered 2021-02-17: 150 ug via INTRATHECAL

## 2021-02-17 MED ORDER — OXYTOCIN-SODIUM CHLORIDE 30-0.9 UT/500ML-% IV SOLN
INTRAVENOUS | Status: DC | PRN
  Administered 2021-02-17 (×2): 30 [IU] via INTRAVENOUS

## 2021-02-17 MED ORDER — SOD CITRATE-CITRIC ACID 500-334 MG/5ML PO SOLN
30.0000 mL | Freq: Once | ORAL | Status: AC
  Administered 2021-02-17: 30 mL via ORAL

## 2021-02-17 MED ORDER — ALBUMIN HUMAN 5 % IV SOLN: INTRAVENOUS | Status: DC | PRN

## 2021-02-17 MED ORDER — NALOXONE HCL 0.4 MG/ML IJ SOLN: 0.4000 mg | INTRAMUSCULAR | Status: DC | PRN

## 2021-02-17 MED ORDER — SIMETHICONE 80 MG PO CHEW
80.0000 mg | CHEWABLE_TABLET | ORAL | Status: DC | PRN
  Administered 2021-02-19 – 2021-03-01 (×2): 80 mg via ORAL
  Filled 2021-02-17: qty 1

## 2021-02-17 MED ORDER — HYDROMORPHONE HCL 1 MG/ML IJ SOLN
INTRAMUSCULAR | Status: AC
  Filled 2021-02-17: qty 0.5

## 2021-02-17 MED ORDER — DIPHENHYDRAMINE HCL 25 MG PO CAPS: 25.0000 mg | ORAL_CAPSULE | ORAL | Status: DC | PRN

## 2021-02-17 MED ORDER — DIPHENHYDRAMINE HCL 50 MG/ML IJ SOLN: 12.5000 mg | INTRAMUSCULAR | Status: DC | PRN

## 2021-02-17 MED ORDER — SOD CITRATE-CITRIC ACID 500-334 MG/5ML PO SOLN
ORAL | Status: AC
  Filled 2021-02-17: qty 30

## 2021-02-17 MED ORDER — ACETAMINOPHEN 500 MG PO TABS: 1000.0000 mg | ORAL_TABLET | Freq: Four times a day (QID) | ORAL | Status: DC

## 2021-02-17 MED ORDER — KETAMINE HCL 50 MG/5ML IJ SOSY
PREFILLED_SYRINGE | INTRAMUSCULAR | Status: AC
  Filled 2021-02-17: qty 5

## 2021-02-17 SURGICAL SUPPLY — 38 items
BENZOIN TINCTURE PRP APPL 2/3 (GAUZE/BANDAGES/DRESSINGS) ×2 IMPLANT
CATH ROBINSON RED A/P 16FR (CATHETERS) IMPLANT
CHLORAPREP W/TINT 26ML (MISCELLANEOUS) ×4 IMPLANT
CLAMP CORD UMBIL (MISCELLANEOUS) IMPLANT
CLOTH BEACON ORANGE TIMEOUT ST (SAFETY) ×2 IMPLANT
DERMABOND ADVANCED (GAUZE/BANDAGES/DRESSINGS) ×1
DERMABOND ADVANCED .7 DNX12 (GAUZE/BANDAGES/DRESSINGS) ×1 IMPLANT
DRSG OPSITE POSTOP 4X10 (GAUZE/BANDAGES/DRESSINGS) ×2 IMPLANT
ELECT REM PT RETURN 9FT ADLT (ELECTROSURGICAL) ×2
ELECTRODE REM PT RTRN 9FT ADLT (ELECTROSURGICAL) ×1 IMPLANT
EXTRACTOR VACUUM M CUP 4 TUBE (SUCTIONS) IMPLANT
GLOVE BIOGEL PI IND STRL 7.0 (GLOVE) ×1 IMPLANT
GLOVE BIOGEL PI IND STRL 7.5 (GLOVE) ×1 IMPLANT
GLOVE BIOGEL PI INDICATOR 7.0 (GLOVE) ×1
GLOVE BIOGEL PI INDICATOR 7.5 (GLOVE) ×1
GLOVE ECLIPSE 7.5 STRL STRAW (GLOVE) ×2 IMPLANT
GOWN STRL REUS W/TWL LRG LVL3 (GOWN DISPOSABLE) ×6 IMPLANT
HEMOSTAT ARISTA ABSORB 3G PWDR (HEMOSTASIS) ×4 IMPLANT
KIT ABG SYR 3ML LUER SLIP (SYRINGE) IMPLANT
NEEDLE HYPO 25X5/8 SAFETYGLIDE (NEEDLE) IMPLANT
NS IRRIG 1000ML POUR BTL (IV SOLUTION) ×2 IMPLANT
PACK C SECTION WH (CUSTOM PROCEDURE TRAY) ×2 IMPLANT
PAD OB MATERNITY 4.3X12.25 (PERSONAL CARE ITEMS) ×2 IMPLANT
PENCIL SMOKE EVAC W/HOLSTER (ELECTROSURGICAL) ×2 IMPLANT
RTRCTR C-SECT PINK 25CM LRG (MISCELLANEOUS) ×2 IMPLANT
STRIP CLOSURE SKIN 1/2X4 (GAUZE/BANDAGES/DRESSINGS) ×2 IMPLANT
SUT PLAIN 2 0 (SUTURE) ×1
SUT PLAIN ABS 2-0 54XMFL TIE (SUTURE) ×1 IMPLANT
SUT VIC AB 0 CT1 27 (SUTURE) ×1
SUT VIC AB 0 CT1 27XBRD ANBCTR (SUTURE) ×1 IMPLANT
SUT VIC AB 0 CTX 36 (SUTURE) ×3
SUT VIC AB 0 CTX36XBRD ANBCTRL (SUTURE) ×3 IMPLANT
SUT VIC AB 2-0 CT1 27 (SUTURE) ×3
SUT VIC AB 2-0 CT1 TAPERPNT 27 (SUTURE) ×3 IMPLANT
SUT VIC AB 4-0 KS 27 (SUTURE) ×2 IMPLANT
TOWEL OR 17X24 6PK STRL BLUE (TOWEL DISPOSABLE) ×2 IMPLANT
TRAY FOLEY W/BAG SLVR 14FR LF (SET/KITS/TRAYS/PACK) ×2 IMPLANT
WATER STERILE IRR 1000ML POUR (IV SOLUTION) ×2 IMPLANT

## 2021-02-17 NOTE — Progress Notes (Signed)
Report for cesarean surgery called to [facility], CRNA.

## 2021-02-17 NOTE — Progress Notes (Signed)
Inpatient Diabetes Program Recommendations  Diabetes Treatment Program Recommendations  ADA Standards of Care 2018 Diabetes in Pregnancy Target Glucose Ranges:  Fasting: 60 - 90 mg/dL Preprandial: 60 - 585 mg/dL 1 hr postprandial: Less than 140mg /dL (from first bite of meal) 2 hr postprandial: Less than 120 mg/dL (from first bite of meal)     Lab Results  Component Value Date   GLUCAP 113 (H) 02/17/2021   HGBA1C 5.8 (H) 11/21/2020    Review of Glycemic Control Results for MARKIE, HEFFERNAN (MRN [name]) as of 02/17/2021 09:13  Ref. Range 02/15/2021 01:01 02/15/2021 05:03 02/16/2021 04:37 02/17/2021 04:35  Glucose-Capillary Latest Ref Range: 70 - 99 mg/dL 02/19/2021 (H) 638 (H) 177 (H) 113 (H)  Diabetes history: DM 1 Outpatient Diabetes medications: Omnipod with Novolog insulin  Most recent insulin pump rates from [facility]: 0.05 units/hr= 1.2 units in 24 hours, 1 units drops blood sugar 100 mg/dL, 1 units per 30 grams of CHO   In DM coordinator note from 11/20/2020: "Current pump settings are: 12-6am  0.15 units/hour 6am-12am 0.25 units/hour Total 5.4 units/24 hours  CF 80 (1 unit drops glucose 80 mg/dl) I:C 11/22/2020 (1 unit covers 20 grams of carbs) Target BG 100 mg/dl"   Current orders for Inpatient glycemic control:  IV insulin Solucortef  D10% @25  ml/hr   Continue with IV insulin for at least 24 hours post op to determine insulin needs.  Thanks, 5:79, MSN, RNC-OB Diabetes Coordinator [phone] (8a-5p)

## 2021-02-17 NOTE — Interval H&P Note (Signed)
History and Physical Interval Note:  02/17/2021 10:19 AM  Ashley Camacho [id number] at [redacted]w[redacted]d has been hospitalized since 7/26 for gastroparesis and hyperemesis due to type 1 diabetes and adrenal insufficiency.  The various methods of treatment have been discussed with the patient and family. After consideration of risks, benefits and other options for treatment, the patient has consented to  Procedure: Cesarean Section with Tubal ligation as a surgical intervention.  The patient's history has been reviewed, patient examined, no change in status, stable for surgery.  I have reviewed the patient's chart and labs.  Questions were answered to the patient's satisfaction.     [name]

## 2021-02-17 NOTE — Transfer of Care (Signed)
Immediate Anesthesia Transfer of Care Note  Patient: Ashley Camacho  Procedure(s) Performed: CESAREAN SECTION WITH BILATERAL TUBAL LIGATION APPLICATION OF CELL SAVER  Patient Location: PACU  Anesthesia Type:Spinal  Level of Consciousness: awake  Airway & Oxygen Therapy: Patient Spontanous Breathing  Post-op Assessment: Report given to RN and Post -op Vital signs reviewed and stable  Post vital signs: Reviewed and stable  Last Vitals:  Vitals Value Taken Time  BP 145/69 02/17/21 1745  Temp    Pulse 81 02/17/21 1752  Resp 13 02/17/21 1752  SpO2 94 % 02/17/21 1752  Vitals shown include unvalidated device data.  Last Pain:  Vitals:   02/17/21 1516  TempSrc: Oral  PainSc:       Patients Stated Pain Goal: 2 (02/17/21 1000)  Complications: No notable events documented.

## 2021-02-17 NOTE — Lactation Note (Signed)
This note was copied from a baby's chart. Lactation Consultation Note  Patient Name: Ashley Camacho [id number] Date: 02/17/2021   Age:30 hours  Spoke to [facility] Specialty care RN Marita [place] and she reported to [facility] that they just got mom in her room a few minutes ago. She'll be setting her up with a pump tonight, NICU LC to follow up tomorrow to do initial assessment.   Joel Cowin S Aleathia Purdy 02/17/2021, 8:27 PM

## 2021-02-17 NOTE — Op Note (Signed)
Cesarean Section and Bilateral Salpingectomy Op Note  Ashley Camacho PROCEDURE DATE: 02/17/2021  PREOPERATIVE DIAGNOSIS: Intrauterine pregnancy at  [redacted]w[redacted]d weeks gestation; cHTN, chronic CHF, adrenal insufficiency, and type 1 diabetes complicated by gastroparesis  POSTOPERATIVE DIAGNOSIS: The same  PROCEDURE: Repeat Low Transverse Cesarean Section  SURGEON:  Dr. Candelaria Celeste  ASSISTANT: Dr. Warner Mccreedy  INDICATIONS: Ashley Camacho is a 30 y.o. [phone] at [redacted]w[redacted]d scheduled for cesarean section secondary to maternal indications including cHTN, chronic CHF, adrenal insufficiency, and type 1 diabetes complicated by gastroparesis.  The risks of cesarean section discussed with the patient included but were not limited to: bleeding which may require transfusion or reoperation; infection which may require antibiotics; injury to bowel, bladder, ureters or other surrounding organs; injury to the fetus; need for additional procedures including hysterectomy in the event of a life-threatening hemorrhage; placental abnormalities wth subsequent pregnancies, incisional problems, thromboembolic phenomenon and other postoperative/anesthesia complications. The patient concurred with the proposed plan, giving informed written consent for the procedure.    FINDINGS:  Viable female infant in vertex presentation.  Apgars 9 and 9, weight, 2060g.  Clear amniotic fluid.  Intact placenta, three vessel cord.  Normal uterus, fallopian tubes and ovaries bilaterally.  ANESTHESIA:    Spinal INTRAVENOUS FLUIDS:1500 ml of crystalloid and 250cc albumin ESTIMATED BLOOD LOSS: 991 ml URINE OUTPUT:  100 ml SPECIMENS: Placenta sent to L&D COMPLICATIONS: Bleeding as listed below.   PROCEDURE IN DETAIL:  The patient received intravenous antibiotics and had sequential compression devices applied to her lower extremities while in the preoperative area.  She was then taken to the operating room where spinal anesthesia was administered and  was found to be adequate. She was then placed in a dorsal supine position with a leftward tilt, and prepped and draped in a sterile manner.  A foley catheter was placed into her bladder and attached to constant gravity, which drained clear fluid throughout.  After an adequate timeout was performed, a Pfannenstiel skin incision was made with scalpel and carried through to the underlying layer of fascia. The fascia was incised in the midline and this incision was extended bilaterally using the Mayo scissors. Kocher clamps were applied to the superior aspect of the fascial incision and the underlying rectus muscles were dissected off bluntly. The rectus muscles were separated in the midline bluntly and the peritoneum was entered bluntly. An Alexis retractor was placed to aid in visualization of the uterus.  Attention was turned to the lower uterine segment where a transverse hysterotomy was made with a scalpel and extended bilaterally bluntly. The infant was successfully delivered, and cord was clamped and cut and infant was handed over to awaiting neonatology team. Uterine massage was then administered and the placenta delivered intact with three-vessel cord. The uterus was then cleared of clot and debris.  The hysterotomy was closed with 0 Vicryl in a running locked fashion, and an imbricating layer was also placed with a 0 Vicryl. There was some oozing at the site and arista was used. At that time hemostasis was noted.   Attention was then turned to the fallopian tubes. Bilateral salpingectomy: Two Kelly clamps were placed on the right fallopian tube taking care to incorporate the fimbriae.  The fallopian tube was then removed with Metzenbaum scissors. The pedicle was then ligated with 2-0 Vicryl suture and the clamp was removed with excellent hemostasis noted. Then a second ligature of 0 Vicryl suture was placed below the remaining clamp, the clamp was then removed and  hemostasis was observed. The same procedure  was then carried out on the left fallopian tube with excellent hemostasis noted.  The abdomen and the pelvis were cleared of all clot and debris and the Jon Gills was removed. Hemostasis was confirmed on all surfaces.  The peritoneum was reapproximated using 2-0 vicryl running stitches. While the fascial stitch was being done pooling of blood was noted underneath an opening in the peritoneal  stitch so the initial peritoneal stitch was removed. The right adnexa was evaluated and found to have oozing at the site of the salpingectomy and a 3-0 vicryl suture was used to tie knots after which it was found to be hemostatic. Additionally, there was some oozing noted at the hysterotomy site and a figure of eight and a U stitch was done and excellent hemostasis was noted.   At that time the peritoneum was again reapproximated using 2-0 vicryl running stitches. The fascia was then completely closed using 0 Vicryl in a running fashion. The subcutaneous layer was reapproximated with vicryl and the skin was closed with 4-0 vicryl. The patient tolerated the procedure well. Sponge, lap, instrument and needle counts were correct x 2. She was taken to the recovery room in stable condition.     Warner Mccreedy, MD, MPH OB Fellow, Faculty Practice

## 2021-02-17 NOTE — Progress Notes (Addendum)
ANTICOAGULATION CONSULT NOTE - Follow Up Consult  Pharmacy Consult for heparin Indication: right brachial VTE in pregnancy  No Known Allergies  Patient Measurements: Height: 5\' 1"  (154.9 cm) Weight: 76.1 kg (167 lb 12.8 oz) IBW/kg (Calculated) : 47.8 Heparin Dosing Weight: 64.8 kg  Vital Signs: Temp: 97.8 F (36.6 C) (09/27 0912) Temp Source: Oral (09/27 0912) BP: 151/62 (09/27 0912) Pulse Rate: 96 (09/27 0912)  Labs: Recent Labs    02/15/21 0500 02/16/21 0319 02/17/21 0500 02/17/21 0642  HGB 8.6* 8.2* 7.9*  --   HCT 26.0* 25.3* 24.3*  --   PLT 180 180 161  --   HEPARINUNFRC 0.43 0.30 0.36  --   CREATININE 1.60* 1.70*  --  1.49*    Estimated Creatinine Clearance: 52 mL/min (A) (by C-G formula based on SCr of 1.49 mg/dL (H)).   Medications:  Scheduled:   Chlorhexidine Gluconate Cloth  6 each Topical Daily   cholecalciferol  1,000 Units Oral Daily   vitamin B-6  12.5 mg Oral BID   And   doxylamine (Sleep)  12.5 mg Oral BID   DULoxetine  20 mg Oral Daily   famotidine  20 mg Oral BID   feeding supplement (GLUCERNA SHAKE)  237 mL Oral BID BM   hydrALAZINE  75 mg Oral Q8H   [START ON 02/20/2021] hydrocortisone  25 mg Oral Q24H   And   [START ON 02/20/2021] hydrocortisone  15 mg Oral Q24H   hydrocortisone sod succinate (SOLU-CORTEF) inj  100 mg Intravenous Q8H   Followed by   02/22/2021 ON 02/18/2021] hydrocortisone sod succinate (SOLU-CORTEF) inj  50 mg Intravenous Q8H   Followed by   02/20/2021 ON 02/19/2021] hydrocortisone sod succinate (SOLU-CORTEF) inj  50 mg Intravenous Q12H   mouth rinse  15 mL Mouth Rinse q12n4p   metoCLOPramide  5 mg Oral TID AC & HS   NIFEdipine  30 mg Oral Daily   pantoprazole  40 mg Oral Daily   potassium chloride  40 mEq Oral Daily   prenatal vitamin w/FE, FA  1 tablet Oral Q1200   sodium chloride flush  10-40 mL Intracatheter Q12H   sucralfate  1 g Oral TID WC & HS   terbutaline  0.25 mg Subcutaneous Once   vitamin B-12  1,000 mcg Oral  Daily    Assessment: [name] is a 30 YO female [redacted]w[redacted]d today on heparin for right brachial VTE. Heparin level this AM is 0.36 units/ml at heparin rate of 1000 units/hr. Hgb 7.9 mg/dL and platelets [redacted]w[redacted]d k/uL today. No s/sx of bleeding. Planning for C-section and tubal ligation  @ 15:00 today. Goal of Therapy:  Heparin level 0.3-0.7 units/ml Monitor platelets by anticoagulation protocol: Yes   Plan:  Stopped heparin drip ~ 6 hours prior to scheduled C-section and obtain APTT to ensure adequate drop prior to epidural. Plan to restart therapeutic enoxaparin at least 12 hours after surgery.  Ashley Camacho, Ashley Camacho 02/17/2021,9:44 AM

## 2021-02-17 NOTE — Progress Notes (Signed)
PROGRESS NOTE  Ashley Camacho YYQ:[id number] DOB: 05/15/1991   PCP: Hildred Laser, MD  Patient is from: Home.  DOA: 12/16/2020 LOS: 63  Chief complaints:  No chief complaint on file.    Brief Narrative / Interim history: 30 y.o. G2P0101 with a history of T1DM, chronic HFrEF and recent hospitalization at [facility] on 6/30 for N/V and rhinovirus pneumonia requiring transfer to [facility].  She was discharged home from [facility] on 7/20 only to return on 7/22 with starvation ketoacidosis in the setting of nausea and vomiting likely due to uncontrolled DM-1 and gastroparesis, and brachial vein DVT.She was a started on TPN through PICC line and transferred to [facility] for cortrak and TF initiation.   Hospital course noteworthy of systolic CHF exacerbation (on IV diuretics), AI (p.o. Cortef), ABLA, hematemesis in the setting of grade C esophagitis on EGD, hematuria (seems to have resolved), moderate right hydronephrosis (improved), Enterococcus faecalis UTI (completed treatment), acute urinary retention (resolved)...  Plan for cesarean section on 02/17/2021.  She needs stress dose steroid.   Subjective: Seen and examined earlier this morning.  No major events overnight of this morning.  A little nervous about delivery/cesarean section today.  No chest pain, shortness of breath, dizziness, GI or UTI symptoms.  Objective: Vitals:   02/17/21 0440 02/17/21 0833 02/17/21 0912 02/17/21 0951  BP: (!) 142/62 (!) 152/62 (!) 151/62 (!) 147/61  Pulse: 92 98 96 96  Resp: 18 18 20 20   Temp: 98.1 F (36.7 C) 98 F (36.7 C) 97.8 F (36.6 C) 97.8 F (36.6 C)  TempSrc: Oral Oral Oral Oral  SpO2: 98% 98% 95%   Weight:      Height:        Intake/Output Summary (Last 24 hours) at 02/17/2021 1042 Last data filed at 02/17/2021 0928 Gross per 24 hour  Intake 1327.53 ml  Output 1350 ml  Net -22.47 ml   Filed Weights   01/24/21 0603 01/26/21 0530 02/08/21 1157  Weight: 81.7 kg 80.6 kg 76.1 kg     Examination  GENERAL: No apparent distress.  Nontoxic. HEENT: MMM.  Vision and hearing grossly intact.  NECK: Supple.  No apparent JVD.  RESP: 95% on RA.  No IWOB.  Fair aeration bilaterally. CVS:  RRR. Heart sounds normal.  ABD/GI/GU: BS+.  Gravid uterus. MSK/EXT:  Moves extremities. No apparent deformity.  Trace edema, L> R. SKIN: no apparent skin lesion or wound NEURO: Awake and alert. Oriented appropriately.  No apparent focal neuro deficit. PSYCH: Calm. Normal affect.   Procedures:  8/24-EGD with grade C esophagitis.  Microbiology summarized: 7/30-wet prep negative. 7/30-urine culture with insignificant growth. 8/15-urine culture with pansensitive Enterococcus faecalis 8/22-C. difficile negative 9/6 & 7-urine culture with multiple species 9/9-urine culture negative  Assessment & Plan: Volume overload/acute systolic CHF with improved EF-improved.  Denies cardiopulmonary symptoms.  About 1.5 L UOP/24 hours off diuretics.  Net -21 L so far.  Creatinine improved after holding Lasix. -Continue holding Lasix -Be cautious with IV fluid perioperatively -Closely monitor fluid status, renal functions and electrolytes -Cardiology signed off.  Intractable nausea/vomiting-could be due to possible gastroparesis, AI, and esophagitis.  Resolved. -Treat treatable causes -Continue Reglan and Zofran as needed  Adrenal insufficiency: low cortisol level with minimal to no response to ACTH suggests primary AI.  ACTH level low but drawn after she started stress dose steroid.  -On stress dose steroid perioperatively  -Will wean to maintenance dose POD day 3. -Outpatient follow-up with endocrinology after discharge  Hematemesis could be due  to grade C esophagitis as noted on EGD on 8/24.  Resolved. -Continue Protonix and Carafate   Gross hematuria, unclear etiology:  Renal US with hydronephrosis.  Hematuria resolved.  H&H stable.  -Reportedly not eligible for cystoscopy due to gravid  uterus -Monitor H&H  Acute urinary tension/bilateral hydronephrosis-moderate right and mild left.  Urinary tension resolved. -Closely monitor urine output  Controlled DM-1 with possible gastroparesis: A1c 5.8% on 7/1. Recent Labs  Lab 02/15/21 0101 02/15/21 0503 02/16/21 0437 02/17/21 0435 02/17/21 1027  GLUCAP 113* 154* 124* 113* 75  -On insulin drip and D10 infusion in the setting of poor p.o. intake -Continue Reglan for possible gastroparesis  AKI/azotemia: Likely from diuretics.  Improving. Recent Labs    02/08/21 0454 02/09/21 0822 02/10/21 0433 02/11/21 1112 02/12/21 0527 02/13/21 0523 02/14/21 0656 02/15/21 0500 02/16/21 0319 02/17/21 0642  BUN 22* 24* 27* 32* 32* 36* 32* 32* 37* 36*  CREATININE 1.21* 1.35* 1.30* 1.48* 1.39* 1.36* 1.32* 1.60* 1.70* 1.49*  -Continue holding diuretics -Monitor urine output and renal function  Elevated liver enzymes: Total bili within normal.  Stable. Recent Labs  Lab 02/10/21 1049 02/11/21 1112 02/12/21 0527 02/13/21 0523 02/14/21 0656 02/15/21 0500 02/16/21 0319 02/17/21 0642  AST 34 43* 43* 43*  --  45*  --   --   ALT 42 48* 48* 53*  --  59*  --   --   ALKPHOS 78 85 83 77  --  78  --   --   BILITOT 0.6 0.6 0.7 0.6  --  1.0  --   --   PROT 5.2* 5.1* 5.1* 5.1*  --  5.2*  --   --   ALBUMIN 2.5* 2.5* 2.4* 2.5* 2.4* 2.5* 2.5* 2.5*    Essential hypertension: SBP in 140s to 150s.  Did not get morning dose due to n.p.o. status -Resume Pradaxa and hydralazine postop  ABLA in the setting of hematemesis and hematuria: H&H relatively stable. Recent Labs    02/08/21 0454 02/09/21 0822 02/10/21 0433 02/11/21 0420 02/12/21 0527 02/13/21 0523 02/14/21 0430 02/15/21 0500 02/16/21 0319 02/17/21 0500  HGB 8.1* 8.3* 8.1* 8.3* 7.9* 8.2* 8.2* 8.6* 8.2* 7.9*  -Agree with transfusion perioperatively  -Continue monitor  Chronic insomnia -On Unisom 12.5 mg twice daily   Acute thrombocytopenia: Resolved.   Right brachial  vein DVT: Dx 12/07/20: seems to be recannulating based on repeat U/S.  -Heparin to be hold preoperatively   Hypokalemia/hypomagnesemia: Resolved. -Monitor   Third trimester intrauterine pregnancy: G2 P0-1-0-1 -Plan for C/S this afternoon.   Enterococcus faecalis UTI-completed antibiotic course on 8/22.   Limited venous access:  - PICC reinserted 8/30.   Epistaxis/vaginal bleeding: Seems to have resolved.  Inadequate oral intake Body mass index is 31.71 kg/m. Nutrition Problem: Inadequate oral intake Etiology: nausea, vomiting Signs/Symptoms: NPO status Interventions: TPN, Tube feeding   DVT prophylaxis:  Place TED hose Start: 01/17/21 1655 Patient is on heparin drip for DVT.  Code Status: Full code Family Communication: Patient and/or RN. Available if any question.  Level of care: Antepartum Status is: Inpatient   Dispo: Per primary   Sch Meds:  Scheduled Meds:  Chlorhexidine Gluconate Cloth  6 each Topical Daily   cholecalciferol  1,000 Units Oral Daily   vitamin B-6  12.5 mg Oral BID   And   doxylamine (Sleep)  12.5 mg Oral BID   DULoxetine  20 mg Oral Daily   famotidine  20 mg Oral BID   feeding supplement (GLUCERNA SHAKE)  237 mL Oral BID BM   hydrALAZINE  75 mg Oral Q8H   [START ON 02/20/2021] hydrocortisone  25 mg Oral Q24H   And   [START ON 02/20/2021] hydrocortisone  15 mg Oral Q24H   hydrocortisone sod succinate (SOLU-CORTEF) inj  100 mg Intravenous Q8H   Followed by   Melene Muller ON 02/18/2021] hydrocortisone sod succinate (SOLU-CORTEF) inj  50 mg Intravenous Q8H   Followed by   Melene Muller ON 02/19/2021] hydrocortisone sod succinate (SOLU-CORTEF) inj  50 mg Intravenous Q12H   mouth rinse  15 mL Mouth Rinse q12n4p   metoCLOPramide  5 mg Oral TID AC & HS   NIFEdipine  30 mg Oral Daily   pantoprazole  40 mg Oral Daily   potassium chloride  40 mEq Oral Daily   prenatal vitamin w/FE, FA  1 tablet Oral Q1200   sodium chloride flush  10-40 mL Intracatheter Q12H    sucralfate  1 g Oral TID WC & HS   terbutaline  0.25 mg Subcutaneous Once   vitamin B-12  1,000 mcg Oral Daily   Continuous Infusions:  sodium chloride 250 mL (02/17/21 0927)   dextrose 25 mL/hr at 02/17/21 0239   insulin Stopped (02/17/21 1025)   PRN Meds:.sodium chloride, acetaminophen, dextrose, diphenhydrAMINE-zinc acetate, Glycerin (Adult), NIFEdipine, ondansetron, prochlorperazine, senna, sodium chloride, sodium chloride flush  Antimicrobials: Anti-infectives (From admission, onward)    Start     Dose/Rate Route Frequency Ordered Stop   01/30/21 1315  amoxicillin (AMOXIL) capsule 500 mg  Status:  Discontinued        500 mg Oral 2 times daily 01/30/21 1218 02/01/21 1426   01/30/21 1200  ampicillin (OMNIPEN) 2 g in sodium chloride 0.9 % 100 mL IVPB  Status:  Discontinued        2 g 300 mL/hr over 20 Minutes Intravenous Every 6 hours 01/30/21 1105 01/30/21 1218   01/06/21 1630  amoxicillin (AMOXIL) 250 MG/5ML suspension 500 mg        500 mg Per Tube Every 8 hours 01/06/21 1534 01/13/21 0602   01/06/21 1615  amoxicillin (AMOXIL) chewable tablet 500 mg  Status:  Discontinued        500 mg Oral Every 8 hours 01/06/21 1528 01/06/21 1532        I have personally reviewed the following labs and images: CBC: Recent Labs  Lab 02/13/21 0523 02/14/21 0430 02/15/21 0500 02/16/21 0319 02/17/21 0500  WBC 6.5 6.9 6.4 6.4 7.4  HGB 8.2* 8.2* 8.6* 8.2* 7.9*  HCT 24.8* 25.2* 26.0* 25.3* 24.3*  MCV 95.8 95.5 94.5 96.6 96.4  PLT 166 173 180 180 161   BMP &GFR Recent Labs  Lab 02/10/21 1049 02/11/21 1112 02/13/21 0523 02/14/21 0656 02/15/21 0500 02/16/21 0319 02/17/21 0500 02/17/21 0642  NA  --    < > 136 137 136 137  --  135  K  --    < > 3.5 3.7 4.0 4.1  --  4.3  CL  --    < > 98 101 99 101  --  102  CO2  --    < > 30 29 30 29   --  27  GLUCOSE  --    < > 99 94 143* 126*  --  261*  BUN  --    < > 36* 32* 32* 37*  --  36*  CREATININE  --    < > 1.36* 1.32* 1.60* 1.70*  --   1.49*  CALCIUM  --    < >  8.4* 8.4* 8.5* 8.6*  --  8.4*  MG 1.7  --   --  2.2 2.1 2.1 2.3  --   PHOS  --   --   --  3.7 3.4 3.1  --  3.1   < > = values in this interval not displayed.   Estimated Creatinine Clearance: 52 mL/min (A) (by C-G formula based on SCr of 1.49 mg/dL (H)). Liver & Pancreas: Recent Labs  Lab 02/10/21 1049 02/11/21 1112 02/12/21 0527 02/13/21 0523 02/14/21 0656 02/15/21 0500 02/16/21 0319 02/17/21 0642  AST 34 43* 43* 43*  --  45*  --   --   ALT 42 48* 48* 53*  --  59*  --   --   ALKPHOS 78 85 83 77  --  78  --   --   BILITOT 0.6 0.6 0.7 0.6  --  1.0  --   --   PROT 5.2* 5.1* 5.1* 5.1*  --  5.2*  --   --   ALBUMIN 2.5* 2.5* 2.4* 2.5* 2.4* 2.5* 2.5* 2.5*   No results for input(s): LIPASE, AMYLASE in the last 168 hours. No results for input(s): AMMONIA in the last 168 hours. Diabetic: No results for input(s): HGBA1C in the last 72 hours. Recent Labs  Lab 02/15/21 0101 02/15/21 0503 02/16/21 0437 02/17/21 0435 02/17/21 1027  GLUCAP 113* 154* 124* 113* 75   Cardiac Enzymes: No results for input(s): CKTOTAL, CKMB, CKMBINDEX, TROPONINI in the last 168 hours. No results for input(s): PROBNP in the last 8760 hours. Coagulation Profile: No results for input(s): INR, PROTIME in the last 168 hours. Thyroid Function Tests: No results for input(s): TSH, T4TOTAL, FREET4, T3FREE, THYROIDAB in the last 72 hours. Lipid Profile: No results for input(s): CHOL, HDL, LDLCALC, TRIG, CHOLHDL, LDLDIRECT in the last 72 hours. Anemia Panel: No results for input(s): VITAMINB12, FOLATE, FERRITIN, TIBC, IRON, RETICCTPCT in the last 72 hours. Urine analysis:    Component Value Date/Time   COLORURINE AMBER (A) 01/10/2021 0131   APPEARANCEUR CLOUDY (A) 01/10/2021 0131   APPEARANCEUR Clear 09/18/2020 1153   LABSPEC 1.017 01/10/2021 0131   LABSPEC 1.026 03/01/2013 1844   PHURINE 5.0 01/10/2021 0131   GLUCOSEU NEGATIVE 01/10/2021 0131   GLUCOSEU >=500 03/01/2013 1844    HGBUR NEGATIVE 01/10/2021 0131   BILIRUBINUR NEGATIVE 01/10/2021 0131   BILIRUBINUR neg 10/23/2020 0953   BILIRUBINUR Negative 09/18/2020 1153   BILIRUBINUR Negative 03/01/2013 1844   KETONESUR NEGATIVE 01/10/2021 0131   PROTEINUR NEGATIVE 01/10/2021 0131   UROBILINOGEN 0.2 10/23/2020 0953   NITRITE NEGATIVE 01/10/2021 0131   LEUKOCYTESUR SMALL (A) 01/10/2021 0131   LEUKOCYTESUR Negative 03/01/2013 1844   Sepsis Labs: Invalid input(s): PROCALCITONIN, LACTICIDVEN  Microbiology: Recent Results (from the past 240 hour(s))  Resp Panel by RT-PCR (Flu A&B, Covid) Nasopharyngeal Swab     Status: None   Collection Time: 02/17/21  6:27 AM   Specimen: Nasopharyngeal Swab; Nasopharyngeal(NP) swabs in vial transport medium  Result Value Ref Range Status   SARS Coronavirus 2 by RT PCR NEGATIVE NEGATIVE Final    Comment: (NOTE) SARS-CoV-2 target nucleic acids are NOT DETECTED.  The SARS-CoV-2 RNA is generally detectable in upper respiratory specimens during the acute phase of infection. The lowest concentration of SARS-CoV-2 viral copies this assay can detect is 138 copies/mL. A negative result does not preclude SARS-Cov-2 infection and should not be used as the sole basis for treatment or other patient management decisions. A negative result may occur with  improper  specimen collection/handling, submission of specimen other than nasopharyngeal swab, presence of viral mutation(s) within the areas targeted by this assay, and inadequate number of viral copies(<138 copies/mL). A negative result must be combined with clinical observations, patient history, and epidemiological information. The expected result is Negative.  Fact Sheet for Patients:  BloggerCourse.com  Fact Sheet for Healthcare Providers:  SeriousBroker.it  This test is no t yet approved or cleared by the [place] FDA and  has been authorized for detection and/or diagnosis  of SARS-CoV-2 by FDA under an Emergency Use Authorization (EUA). This EUA will remain  in effect (meaning this test can be used) for the duration of the COVID-19 declaration under Section 564(b)(1) of the Act, 21 U.S.C.section 360bbb-3(b)(1), unless the authorization is terminated  or revoked sooner.       Influenza A by PCR NEGATIVE NEGATIVE Final   Influenza B by PCR NEGATIVE NEGATIVE Final    Comment: (NOTE) The Xpert Xpress SARS-CoV-2/FLU/RSV plus assay is intended as an aid in the diagnosis of influenza from Nasopharyngeal swab specimens and should not be used as a sole basis for treatment. Nasal washings and aspirates are unacceptable for Xpert Xpress SARS-CoV-2/FLU/RSV testing.  Fact Sheet for Patients: BloggerCourse.com  Fact Sheet for Healthcare Providers: SeriousBroker.it  This test is not yet approved or cleared by the [place] FDA and has been authorized for detection and/or diagnosis of SARS-CoV-2 by FDA under an Emergency Use Authorization (EUA). This EUA will remain in effect (meaning this test can be used) for the duration of the COVID-19 declaration under Section 564(b)(1) of the Act, 21 U.S.C. section 360bbb-3(b)(1), unless the authorization is terminated or revoked.  Performed at [facility], [address], [place], [place] [address]     Radiology Studies: No results found.   Javante Nilsson T. Hazem Kenner [facility]  If 7PM-7AM, please contact night-coverage [email or web address] 02/17/2021, 10:42 AM

## 2021-02-17 NOTE — H&P (View-Only) (Signed)
FACULTY PRACTICE ANTEPARTUM NOTE  Ashley Camacho is a 30 y.o. G2P0101 at [redacted]w[redacted]d  who is admitted for inpatient management of diabetes, gastroparesis, chronic hypertension, adrenal insufficiency, hyperemesis. Fetal presentation is cephalic. Length of Stay:  63  Days  Subjective: Patient is n.p.o. since last night for scheduled C-section with tubal ligation.  Heparin turned off at 6 AM for surgery this afternoon at 3:15p.  We will continue Endotool throughout surgery.  Patient reports good fetal movement.   She reports no uterine contractions She reports no bleeding  She reports no loss of fluid per vagina.  Vitals:  Blood pressure (!) 147/61, pulse 96, temperature 97.8 F (36.6 C), temperature source Oral, resp. rate 20, height 5\' 1"  (1.549 m), weight 76.1 kg, SpO2 95 %. Physical Examination:  General appearance - alert, well appearing, and in no distress Chest - clear to auscultation, no wheezes, rales or rhonchi, symmetric air entry Heart - normal rate, regular rhythm, normal S1, S2, no murmurs, rubs, clicks or gallops Abdomen - soft, nontender, nondistended, no masses or organomegaly Extremities - peripheral pulses normal, no pedal edema, no clubbing or cyanosis Fundal Height:  size equals dates Pelvic Exam:  examination not indicated Cervical Exam: Not evaluated. Extremities: extremities normal, atraumatic, no cyanosis or edema  Membranes:intact  Fetal Monitoring:  Baseline: 140 bpm, Variability: Good {> 6 bpm), Accelerations: no accels, and Decelerations: Absent  Labs:  Results for orders placed or performed during the hospital encounter of 12/16/20 (from the past 24 hour(s))  Type and screen [facility] Draw Type and Screen with other 5:00am labs   Collection Time: 02/16/21 10:50 AM  Result Value Ref Range   ABO/RH(D) A POS    Antibody Screen NEG    Sample Expiration 02/19/2021,2359    Unit Number 02/21/2021    Blood Component Type RED CELLS,LR    Unit  division 00    Status of Unit ISSUED    Transfusion Status OK TO TRANSFUSE    Crossmatch Result      Compatible Performed at [facility], [address], [place], [place]    Unit Number [id number]    Blood Component Type RED CELLS,LR    Unit division 00    Status of Unit ALLOCATED    Transfusion Status OK TO TRANSFUSE    Crossmatch Result Compatible   BPAM RBC   Collection Time: 02/16/21 10:50 AM  Result Value Ref Range   ISSUE DATE / TIME 02/18/21    Blood Product Unit Number [id number]    PRODUCT CODE [id number]    Unit Type and Rh 6200    Blood Product Expiration Date C7893Y10    ISSUE DATE / TIME [id number]    Blood Product Unit Number [id number]    PRODUCT CODE [id number]    Unit Type and Rh 6200    Blood Product Expiration Date D3267T24   Prepare RBC (crossmatch)   Collection Time: 02/16/21  3:20 PM  Result Value Ref Range   Order Confirmation      ORDER PROCESSED BY BLOOD BANK Performed at [facility], [address] [place], [place]   Glucose, capillary   Collection Time: 02/17/21  4:35 AM  Result Value Ref Range   Glucose-Capillary 113 (H) 70 - 99 mg/dL  CBC   Collection Time: 02/17/21  5:00 AM  Result Value Ref Range   WBC 7.4 4.0 - 10.5 K/uL   RBC 2.52 (L) 3.87 - 5.11 MIL/uL  Hemoglobin 7.9 (L) 12.0 - 15.0 g/dL   HCT 24.3 (L) 36.0 - 46.0 %   MCV 96.4 80.0 - 100.0 fL   MCH 31.3 26.0 - 34.0 pg   MCHC 32.5 30.0 - 36.0 g/dL   RDW 16.3 (H) 11.5 - 15.5 %   Platelets 161 150 - 400 K/uL   nRBC 0.0 0.0 - 0.2 %  Heparin level (unfractionated)   Collection Time: 02/17/21  5:00 AM  Result Value Ref Range   Heparin Unfractionated 0.36 0.30 - 0.70 IU/mL  Magnesium   Collection Time: 02/17/21  5:00 AM  Result Value Ref Range   Magnesium 2.3 1.7 - 2.4 mg/dL  Resp Panel by RT-PCR (Flu A&B, Covid) Nasopharyngeal Swab   Collection Time: 02/17/21  6:27 AM   Specimen: Nasopharyngeal Swab; Nasopharyngeal(NP) swabs  in vial transport medium  Result Value Ref Range   SARS Coronavirus 2 by RT PCR NEGATIVE NEGATIVE   Influenza A by PCR NEGATIVE NEGATIVE   Influenza B by PCR NEGATIVE NEGATIVE  Renal function panel   Collection Time: 02/17/21  6:42 AM  Result Value Ref Range   Sodium 135 135 - 145 mmol/L   Potassium 4.3 3.5 - 5.1 mmol/L   Chloride 102 98 - 111 mmol/L   CO2 27 22 - 32 mmol/L   Glucose, Bld 261 (H) 70 - 99 mg/dL   BUN 36 (H) 6 - 20 mg/dL   Creatinine, Ser 1.49 (H) 0.44 - 1.00 mg/dL   Calcium 8.4 (L) 8.9 - 10.3 mg/dL   Phosphorus 3.1 2.5 - 4.6 mg/dL   Albumin 2.5 (L) 3.5 - 5.0 g/dL   GFR, Estimated 48 (L) >60 mL/min   Anion gap 6 5 - 15    Imaging Studies:       Medications:  Scheduled  Chlorhexidine Gluconate Cloth  6 each Topical Daily   cholecalciferol  1,000 Units Oral Daily   vitamin B-6  12.5 mg Oral BID   And   doxylamine (Sleep)  12.5 mg Oral BID   DULoxetine  20 mg Oral Daily   famotidine  20 mg Oral BID   feeding supplement (GLUCERNA SHAKE)  237 mL Oral BID BM   hydrALAZINE  75 mg Oral Q8H   [START ON 02/20/2021] hydrocortisone  25 mg Oral Q24H   And   [START ON 02/20/2021] hydrocortisone  15 mg Oral Q24H   hydrocortisone sod succinate (SOLU-CORTEF) inj  100 mg Intravenous Q8H   Followed by   [START ON 02/18/2021] hydrocortisone sod succinate (SOLU-CORTEF) inj  50 mg Intravenous Q8H   Followed by   [START ON 02/19/2021] hydrocortisone sod succinate (SOLU-CORTEF) inj  50 mg Intravenous Q12H   mouth rinse  15 mL Mouth Rinse q12n4p   metoCLOPramide  5 mg Oral TID AC & HS   NIFEdipine  30 mg Oral Daily   pantoprazole  40 mg Oral Daily   potassium chloride  40 mEq Oral Daily   prenatal vitamin w/FE, FA  1 tablet Oral Q1200   sodium chloride flush  10-40 mL Intracatheter Q12H   sucralfate  1 g Oral TID WC & HS   terbutaline  0.25 mg Subcutaneous Once   vitamin B-12  1,000 mcg Oral Daily   I have reviewed the patient's current medications.  ASSESSMENT: Active  Problems:   Delivery with history of C-section   Acute kidney injury (HCC)   Hyperemesis   DKA, type 1, not at goal (HCC)   Acute deep vein thrombosis (DVT) of   brachial vein of right upper extremity (HCC)   Vitreous hemorrhage of right eye (HCC)   Gastroparesis due to DM (HCC)   Diabetic retinopathy (HCC)   Acute on chronic combined systolic and diastolic CHF (congestive heart failure) (HCC)   Type 1 diabetes mellitus affecting pregnancy in second trimester, antepartum   HFrEF (heart failure with reduced ejection fraction) (HCC)   Anemia during pregnancy   Pre-existing type 1 diabetes mellitus during pregnancy in third trimester   History of preterm delivery, currently pregnant   Leg edema   Acute esophagitis   [redacted] weeks gestation of pregnancy   PLAN: The risks of cesarean section with tubal ligation discussed with the patient included but were not limited to: bleeding which may require transfusion or reoperation; infection which may require antibiotics; injury to bowel, bladder, ureters or other surrounding organs; injury to the fetus; need for additional procedures including hysterectomy in the event of a life-threatening hemorrhage; placental abnormalities wth subsequent pregnancies, incisional problems, thromboembolic phenomenon and other postoperative/anesthesia complications. The patient concurred with the proposed plan, giving informed written consent for the procedure.   Patient has been NPO since last night, she will remain NPO for procedure. Anesthesia and OR aware.  Preoperative prophylactic Ancef ordered on call to the OR.    Will continue endotool throughout surgery and at least first throughout the night. Patient received stress dose steroids - continue for 72 hours.  Will go back on therapeutic anticoagulation 12 hours after surgery.   Ignacio Lowder J, DO 02/17/2021 10:12 AM    

## 2021-02-17 NOTE — Progress Notes (Signed)
FACULTY PRACTICE ANTEPARTUM NOTE  Ashley Camacho is a 30 y.o. G2P0101 at [redacted]w[redacted]d  who is admitted for inpatient management of diabetes, gastroparesis, chronic hypertension, adrenal insufficiency, hyperemesis. Fetal presentation is cephalic. Length of Stay:  63  Days  Subjective: Patient is n.p.o. since last night for scheduled C-section with tubal ligation.  Heparin turned off at 6 AM for surgery this afternoon at 3:15p.  We will continue Endotool throughout surgery.  Patient reports good fetal movement.   She reports no uterine contractions She reports no bleeding  She reports no loss of fluid per vagina.  Vitals:  Blood pressure (!) 147/61, pulse 96, temperature 97.8 F (36.6 C), temperature source Oral, resp. rate 20, height 5\' 1"  (1.549 m), weight 76.1 kg, SpO2 95 %. Physical Examination:  General appearance - alert, well appearing, and in no distress Chest - clear to auscultation, no wheezes, rales or rhonchi, symmetric air entry Heart - normal rate, regular rhythm, normal S1, S2, no murmurs, rubs, clicks or gallops Abdomen - soft, nontender, nondistended, no masses or organomegaly Extremities - peripheral pulses normal, no pedal edema, no clubbing or cyanosis Fundal Height:  size equals dates Pelvic Exam:  examination not indicated Cervical Exam: Not evaluated. Extremities: extremities normal, atraumatic, no cyanosis or edema  Membranes:intact  Fetal Monitoring:  Baseline: 140 bpm, Variability: Good {> 6 bpm), Accelerations: no accels, and Decelerations: Absent  Labs:  Results for orders placed or performed during the hospital encounter of 12/16/20 (from the past 24 hour(s))  Type and screen [facility] Draw Type and Screen with other 5:00am labs   Collection Time: 02/16/21 10:50 AM  Result Value Ref Range   ABO/RH(D) A POS    Antibody Screen NEG    Sample Expiration 02/19/2021,2359    Unit Number 02/21/2021    Blood Component Type RED CELLS,LR    Unit  division 00    Status of Unit ISSUED    Transfusion Status OK TO TRANSFUSE    Crossmatch Result      Compatible Performed at [facility], [address], [place], [place]    Unit Number [id number]    Blood Component Type RED CELLS,LR    Unit division 00    Status of Unit ALLOCATED    Transfusion Status OK TO TRANSFUSE    Crossmatch Result Compatible   BPAM RBC   Collection Time: 02/16/21 10:50 AM  Result Value Ref Range   ISSUE DATE / TIME 02/18/21    Blood Product Unit Number [id number]    PRODUCT CODE [id number]    Unit Type and Rh 6200    Blood Product Expiration Date C7893Y10    ISSUE DATE / TIME [id number]    Blood Product Unit Number [id number]    PRODUCT CODE [id number]    Unit Type and Rh 6200    Blood Product Expiration Date D3267T24   Prepare RBC (crossmatch)   Collection Time: 02/16/21  3:20 PM  Result Value Ref Range   Order Confirmation      ORDER PROCESSED BY BLOOD BANK Performed at [facility], [address] [place], [place]   Glucose, capillary   Collection Time: 02/17/21  4:35 AM  Result Value Ref Range   Glucose-Capillary 113 (H) 70 - 99 mg/dL  CBC   Collection Time: 02/17/21  5:00 AM  Result Value Ref Range   WBC 7.4 4.0 - 10.5 K/uL   RBC 2.52 (L) 3.87 - 5.11 MIL/uL  Hemoglobin 7.9 (L) 12.0 - 15.0 g/dL   HCT 69.4 (L) 85.4 - 62.7 %   MCV 96.4 80.0 - 100.0 fL   MCH 31.3 26.0 - 34.0 pg   MCHC 32.5 30.0 - 36.0 g/dL   RDW 03.5 (H) 00.9 - 38.1 %   Platelets 161 150 - 400 K/uL   nRBC 0.0 0.0 - 0.2 %  Heparin level (unfractionated)   Collection Time: 02/17/21  5:00 AM  Result Value Ref Range   Heparin Unfractionated 0.36 0.30 - 0.70 IU/mL  Magnesium   Collection Time: 02/17/21  5:00 AM  Result Value Ref Range   Magnesium 2.3 1.7 - 2.4 mg/dL  Resp Panel by RT-PCR (Flu A&B, Covid) Nasopharyngeal Swab   Collection Time: 02/17/21  6:27 AM   Specimen: Nasopharyngeal Swab; Nasopharyngeal(NP) swabs  in vial transport medium  Result Value Ref Range   SARS Coronavirus 2 by RT PCR NEGATIVE NEGATIVE   Influenza A by PCR NEGATIVE NEGATIVE   Influenza B by PCR NEGATIVE NEGATIVE  Renal function panel   Collection Time: 02/17/21  6:42 AM  Result Value Ref Range   Sodium 135 135 - 145 mmol/L   Potassium 4.3 3.5 - 5.1 mmol/L   Chloride 102 98 - 111 mmol/L   CO2 27 22 - 32 mmol/L   Glucose, Bld 261 (H) 70 - 99 mg/dL   BUN 36 (H) 6 - 20 mg/dL   Creatinine, Ser 8.29 (H) 0.44 - 1.00 mg/dL   Calcium 8.4 (L) 8.9 - 10.3 mg/dL   Phosphorus 3.1 2.5 - 4.6 mg/dL   Albumin 2.5 (L) 3.5 - 5.0 g/dL   GFR, Estimated 48 (L) >60 mL/min   Anion gap 6 5 - 15    Imaging Studies:       Medications:  Scheduled  Chlorhexidine Gluconate Cloth  6 each Topical Daily   cholecalciferol  1,000 Units Oral Daily   vitamin B-6  12.5 mg Oral BID   And   doxylamine (Sleep)  12.5 mg Oral BID   DULoxetine  20 mg Oral Daily   famotidine  20 mg Oral BID   feeding supplement (GLUCERNA SHAKE)  237 mL Oral BID BM   hydrALAZINE  75 mg Oral Q8H   [START ON 02/20/2021] hydrocortisone  25 mg Oral Q24H   And   [START ON 02/20/2021] hydrocortisone  15 mg Oral Q24H   hydrocortisone sod succinate (SOLU-CORTEF) inj  100 mg Intravenous Q8H   Followed by   Melene Muller ON 02/18/2021] hydrocortisone sod succinate (SOLU-CORTEF) inj  50 mg Intravenous Q8H   Followed by   Melene Muller ON 02/19/2021] hydrocortisone sod succinate (SOLU-CORTEF) inj  50 mg Intravenous Q12H   mouth rinse  15 mL Mouth Rinse q12n4p   metoCLOPramide  5 mg Oral TID AC & HS   NIFEdipine  30 mg Oral Daily   pantoprazole  40 mg Oral Daily   potassium chloride  40 mEq Oral Daily   prenatal vitamin w/FE, FA  1 tablet Oral Q1200   sodium chloride flush  10-40 mL Intracatheter Q12H   sucralfate  1 g Oral TID WC & HS   terbutaline  0.25 mg Subcutaneous Once   vitamin B-12  1,000 mcg Oral Daily   I have reviewed the patient's current medications.  ASSESSMENT: Active  Problems:   Delivery with history of C-section   Acute kidney injury (HCC)   Hyperemesis   DKA, type 1, not at goal [facility])   Acute deep vein thrombosis (DVT) of  brachial vein of right upper extremity (HCC)   Vitreous hemorrhage of right eye (HCC)   Gastroparesis due to DM (HCC)   Diabetic retinopathy (HCC)   Acute on chronic combined systolic and diastolic CHF (congestive heart failure) (HCC)   Type 1 diabetes mellitus affecting pregnancy in second trimester, antepartum   HFrEF (heart failure with reduced ejection fraction) (HCC)   Anemia during pregnancy   Pre-existing type 1 diabetes mellitus during pregnancy in third trimester   History of preterm delivery, currently pregnant   Leg edema   Acute esophagitis   [redacted] weeks gestation of pregnancy   PLAN: The risks of cesarean section with tubal ligation discussed with the patient included but were not limited to: bleeding which may require transfusion or reoperation; infection which may require antibiotics; injury to bowel, bladder, ureters or other surrounding organs; injury to the fetus; need for additional procedures including hysterectomy in the event of a life-threatening hemorrhage; placental abnormalities wth subsequent pregnancies, incisional problems, thromboembolic phenomenon and other postoperative/anesthesia complications. The patient concurred with the proposed plan, giving informed written consent for the procedure.   Patient has been NPO since last night, she will remain NPO for procedure. Anesthesia and OR aware.  Preoperative prophylactic Ancef ordered on call to the OR.    Will continue endotool throughout surgery and at least first throughout the night. Patient received stress dose steroids - continue for 72 hours.  Will go back on therapeutic anticoagulation 12 hours after surgery.   [name], DO 02/17/2021 10:12 AM

## 2021-02-17 NOTE — Anesthesia Postprocedure Evaluation (Signed)
Anesthesia Post Note  Patient: Ashley Camacho  Procedure(s) Performed: CESAREAN SECTION WITH BILATERAL TUBAL LIGATION APPLICATION OF CELL SAVER     Anesthesia Type: Spinal Anesthetic complications: no Comments: Dr. Adrian Blackwater would like to use therapeutic Lovenox postoperatively. I advised that that LMWH may be resumed 24 hours after non-bleeding risk surgery and 48-72 hours after high-bleeding risk surgery. [name], MD     No notable events documented.  Last Vitals:  Vitals:   02/17/21 1151 02/17/21 1516  BP: (!) 156/62 (!) 158/65  Pulse: 96 (!) 101  Resp:  18  Temp:  36.5 C  SpO2:  98%    Last Pain:  Vitals:   02/17/21 1516  TempSrc: Oral  PainSc:    Pain Goal: Patients Stated Pain Goal: 2 (02/17/21 1000)                 Mellody Dance

## 2021-02-17 NOTE — Discharge Summary (Signed)
Postpartum Discharge Summary       Patient Name: Ashley Camacho DOB: 1991-02-01 MRN: [id number]  Date of admission: 12/16/2020 Delivery date:02/17/2021  Delivering provider: [name]  Date of discharge: 03/04/2021  Admitting diagnosis: Hyperemesis [R11.10] Intrauterine pregnancy: [redacted]w[redacted]d     Secondary diagnosis:  Active Problems:   Delivery with history of C-section   Acute kidney injury (HCC)   Hyperemesis   DKA, type 1, not at goal [facility])   Acute deep vein thrombosis (DVT) of brachial vein of right upper extremity (HCC)   Vitreous hemorrhage of right eye (HCC)   Gastroparesis due to DM (HCC)   Diabetic retinopathy (HCC)   Acute on chronic combined systolic and diastolic CHF (congestive heart failure) (HCC)   Type 1 diabetes mellitus affecting pregnancy in second trimester, antepartum   HFrEF (heart failure with reduced ejection fraction) (HCC)   Anemia during pregnancy   Pre-existing type 1 diabetes mellitus during pregnancy in third trimester   History of preterm delivery, currently pregnant   Leg edema   Acute esophagitis   S/P cesarean section   Postoperative hematoma involving genitourinary system following genitourinary procedure  Additional problems: None    Discharge diagnosis: Preterm Pregnancy Delivered and several co-morbid conditions                                               Post partum procedures:blood transfusion Complications: Wound hematoma  Hospital course: Sceduled C/S   30 y.o. yo G2P0202 at [redacted]w[redacted]d was admitted to the hospital 12/16/2020 for scheduled cesarean section with the following indication: elective repeat  with bilateral tubal ligation.Delivery details are as follows:  Membrane Rupture Time/Date: 4:04 PM ,02/17/2021   Delivery Method:C-Section, Low Transverse  Details of operation can be found in separate operative note.  Patient had a postpartum course complicated by a wound hematoma which did not require evacuation. Her hemoglobin  remained stable. Hospitalist team and cardiologist were heavily involved in the management of her other medical conditions. She is ambulating, tolerating a regular diet, passing flatus, and urinating well. Patient is discharged home in stable condition on  03/04/21. Precautions were reviewed with the patient. She has several upcoming appointments for wound check, endocrinologist and cardiologist        Newborn Data: Birth date:02/17/2021  Birth time:4:05 PM  Gender:Female  Living status:Living  Apgars:9 ,9  Weight:2.06 kg      Physical exam  Vitals:   03/03/21 2348 03/04/21 0359 03/04/21 0800 03/04/21 1129  BP: (!) 138/56 (!) 143/53 (!) 146/60 (!) 128/51  Pulse: 81 78 86 84  Resp: 18 18 18 18   Temp: 97.9 F (36.6 C) 98.3 F (36.8 C) 97.9 F (36.6 C) 97.8 F (36.6 C)  TempSrc: Oral Oral Oral Oral  SpO2: 98% 99% 99% 99%  Weight:      Height:       General: alert, cooperative, and no distress Lochia: appropriate Uterine Fundus: firm Incision: dressing is minimally stained with dark blood DVT Evaluation: No evidence of DVT seen on physical exam. Labs: Lab Results  Component Value Date   WBC 9.2 03/02/2021   HGB 7.9 (L) 03/02/2021   HCT 24.7 (L) 03/02/2021   MCV 92.2 03/02/2021   PLT 321 03/02/2021   CMP Latest Ref Rng & Units 03/04/2021  Glucose 70 - 99 mg/dL 05/04/2021)  BUN 6 -  20 mg/dL 62(X)  Creatinine 5.28 - 1.00 mg/dL 4.13(K)  Sodium 440 - 102 mmol/L 139  Potassium 3.5 - 5.1 mmol/L 4.1  Chloride 98 - 111 mmol/L 97(L)  CO2 22 - 32 mmol/L 34(H)  Calcium 8.9 - 10.3 mg/dL 7.2(Z)  Total Protein 6.5 - 8.1 g/dL -  Total Bilirubin 0.3 - 1.2 mg/dL -  Alkaline Phos 38 - 366 U/L -  AST 15 - 41 U/L -  ALT 0 - 44 U/L -   Edinburgh Score: Edinburgh Postnatal Depression Scale Screening Tool 02/18/2021  I have been able to laugh and see the funny side of things. 0  I have looked forward with enjoyment to things. 0  I have blamed myself unnecessarily when things went wrong. 2   I have been anxious or worried for no good reason. 1  I have felt scared or panicky for no good reason. 1  Things have been getting on top of me. 1  I have been so unhappy that I have had difficulty sleeping. 0  I have felt sad or miserable. 1  I have been so unhappy that I have been crying. 0  The thought of harming myself has occurred to me. 0  Edinburgh Postnatal Depression Scale Total 6     After visit meds:  Allergies as of 03/04/2021   No Known Allergies      Medication List     STOP taking these medications    famotidine 20-0.9 MG/50ML-% Commonly known as: PEPCID   ferrous sulfate 324 MG Tbec   NovoLOG FlexPen 100 UNIT/ML FlexPen Generic drug: insulin aspart Replaced by: insulin aspart 100 UNIT/ML injection   ondansetron 4 MG/2ML Soln injection Commonly known as: ZOFRAN   prochlorperazine 10 MG/2ML injection Commonly known as: COMPAZINE   sodium chloride 0.9 % SOLN 50 mL with promethazine 25 MG/ML SOLN 25 mg       TAKE these medications    acetaminophen 500 MG tablet Commonly known as: TYLENOL Take 2 tablets (1,000 mg total) by mouth every 6 (six) hours as needed for headache, fever or moderate pain.   aluminum-petrolatum-zinc 0.027-13.7-12.5% Pste paste Commonly known as: 1-2-3 PASTE Apply 1 application topically 3 (three) times daily.   amLODipine 5 MG tablet Commonly known as: NORVASC Take 1 tablet (5 mg total) by mouth daily. Start taking on: March 05, 2021   bisacodyl 10 MG suppository Commonly known as: DULCOLAX Place 1 suppository (10 mg total) rectally daily as needed for moderate constipation.   enoxaparin 80 MG/0.8ML injection Commonly known as: LOVENOX Inject 0.8 mLs (80 mg total) into the skin every 12 (twelve) hours.   famotidine 20 MG tablet Commonly known as: PEPCID Take 1 tablet (20 mg total) by mouth 2 (two) times daily.   furosemide 40 MG tablet Commonly known as: LASIX Take 1 tablet (40 mg total) by mouth daily.    hydrocortisone 10 MG tablet Commonly known as: CORTEF Take 1 tablet (10 mg total) by mouth daily with breakfast.   hydrocortisone 5 MG tablet Commonly known as: CORTEF Take 1 tablet (5 mg total) by mouth daily with supper.   insulin aspart 100 UNIT/ML injection Commonly known as: novoLOG Inject 0-6 Units into the skin every 4 (four) hours. Replaces: NovoLOG FlexPen 100 UNIT/ML FlexPen   insulin aspart 100 UNIT/ML injection Commonly known as: novoLOG Inject 2 Units into the skin 3 (three) times daily with meals.   insulin glargine-yfgn 100 UNIT/ML injection Commonly known as: SEMGLEE Inject 0.14 mLs (14 Units  total) into the skin daily. Start taking on: March 05, 2021   metoCLOPramide 5 MG tablet Commonly known as: REGLAN Take 1 tablet (5 mg total) by mouth 3 (three) times daily with meals as needed for nausea or vomiting.   mirtazapine 15 MG disintegrating tablet Commonly known as: REMERON SOL-TAB Take 1 tablet (15 mg total) by mouth at bedtime.   oxyCODONE 5 MG immediate release tablet Commonly known as: Oxy IR/ROXICODONE Take 1 tablet (5 mg total) by mouth every 4 (four) hours as needed for moderate pain.   prenatal multivitamin Tabs tablet Take 1 tablet by mouth daily at 12 noon.         Discharge home in stable condition Infant Feeding: Bottle Infant Disposition:NICU Discharge instruction: per After Visit Summary and Postpartum booklet. Activity: Advance as tolerated. Pelvic rest for 6 weeks.  Diet: carb modified diet Future Appointments: Future Appointments  Date Time Provider Department Center  03/18/2021  2:55 PM Venora Maples, MD [facility]  03/19/2021  3:15 PM Azalee Course, PA [facility]   Follow up Visit:  Follow-up Information     [facility] Follow up on 06/09/2021.   Why: best phone is: [phone] please bring your insurance card, photo ID and paperwork that is mailed to your house filled out to your appt. APPOINTMENT  time : 12:45  Dr. name: Marisue Ivan - PCP Contact information: [address] [place] [address] [phone]         [facility] Follow up in 1 week(s).   Specialty: Obstetrics and Gynecology Why: An appointment will be scheduled for you to follow up in 1 week for incision check Contact information: [address] [place] [address] [phone]                Please schedule this patient for a In person postpartum visit in 4 weeks with the following provider: MD. Additional Postpartum F/U:Incision check 1 week  High risk pregnancy complicated by: Bleeding disorder on Lovenox, Type 1 DM, cHTN, gastroparesis Delivery mode:  C-Section, Low Transverse  Anticipated Birth Control:   Salpingectomy done   03/04/2021 Catalina Antigua, MD

## 2021-02-18 ENCOUNTER — Encounter (HOSPITAL_COMMUNITY): Payer: Self-pay | Admitting: Family Medicine

## 2021-02-18 DIAGNOSIS — D62 Acute posthemorrhagic anemia: Secondary | ICD-10-CM

## 2021-02-18 LAB — CBC
HCT: 19.7 % — ABNORMAL LOW (ref 36.0–46.0)
Hemoglobin: 6.5 g/dL — CL (ref 12.0–15.0)
MCH: 31.3 pg (ref 26.0–34.0)
MCHC: 33 g/dL (ref 30.0–36.0)
MCV: 94.7 fL (ref 80.0–100.0)
Platelets: 133 10*3/uL — ABNORMAL LOW (ref 150–400)
RBC: 2.08 MIL/uL — ABNORMAL LOW (ref 3.87–5.11)
RDW: 16.5 % — ABNORMAL HIGH (ref 11.5–15.5)
WBC: 11.4 10*3/uL — ABNORMAL HIGH (ref 4.0–10.5)
nRBC: 0 % (ref 0.0–0.2)

## 2021-02-18 LAB — GLUCOSE, CAPILLARY
Glucose-Capillary: 118 mg/dL — ABNORMAL HIGH (ref 70–99)
Glucose-Capillary: 120 mg/dL — ABNORMAL HIGH (ref 70–99)

## 2021-02-18 LAB — PREPARE RBC (CROSSMATCH)

## 2021-02-18 LAB — RENAL FUNCTION PANEL
Albumin: 2.2 g/dL — ABNORMAL LOW (ref 3.5–5.0)
Anion gap: 6 (ref 5–15)
BUN: 33 mg/dL — ABNORMAL HIGH (ref 6–20)
CO2: 27 mmol/L (ref 22–32)
Calcium: 8.1 mg/dL — ABNORMAL LOW (ref 8.9–10.3)
Chloride: 105 mmol/L (ref 98–111)
Creatinine, Ser: 1.22 mg/dL — ABNORMAL HIGH (ref 0.44–1.00)
GFR, Estimated: >60 mL/min (ref >60)
Glucose, Bld: 124 mg/dL — ABNORMAL HIGH (ref 70–99)
Phosphorus: 5.2 mg/dL — ABNORMAL HIGH (ref 2.5–4.6)
Potassium: 4.2 mmol/L (ref 3.5–5.1)
Sodium: 138 mmol/L (ref 135–145)

## 2021-02-18 LAB — MAGNESIUM: Magnesium: 2 mg/dL (ref 1.7–2.4)

## 2021-02-18 MED ORDER — SODIUM CHLORIDE 0.9% IV SOLUTION: Freq: Once | INTRAVENOUS | Status: AC

## 2021-02-18 MED ORDER — SODIUM CHLORIDE 0.9% IV SOLUTION: Freq: Once | INTRAVENOUS | Status: DC

## 2021-02-18 MED ORDER — FUROSEMIDE 10 MG/ML IJ SOLN
40.0000 mg | Freq: Once | INTRAMUSCULAR | Status: AC
  Administered 2021-02-18: 40 mg via INTRAVENOUS
  Filled 2021-02-18: qty 4

## 2021-02-18 NOTE — Progress Notes (Signed)
Subjective: Postpartum Day 1: Cesarean Delivery Patient reports incisional pain and tolerating PO.    Objective: Vital signs in last 24 hours: Temp:  [96.5 F (35.8 C)-97.8 F (36.6 C)] 97.8 F (36.6 C) (09/28 0905) Pulse Rate:  [77-101] 79 (09/28 0927) Resp:  [0-20] 16 (09/28 0927) BP: (135-186)/(58-107) 137/69 (09/28 0927) SpO2:  [88 %-100 %] 98 % (09/28 0910)  Physical Exam:  General: alert, cooperative, and no distress Lochia: appropriate Uterine Fundus: firm Incision: healing well, no significant drainage DVT Evaluation: No evidence of DVT seen on physical exam.  Recent Labs    02/17/21 0500 02/18/21 0803  HGB 7.9* 6.5*  HCT 24.3* 19.7*    Assessment/Plan: Status post Cesarean section. Postoperative course complicated by anemia   Transfuse 1 unit PRBC.  Scheryl Darter 02/18/2021, 10:17 AM

## 2021-02-18 NOTE — Progress Notes (Signed)
Physical Therapy Treatment Patient Details Name: Ashley Camacho MRN: [id number] DOB: 1991-04-04 Today's Date: 02/18/2021   History of Present Illness 30 y.o. 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 Medical history significant of 56w5don presentation, Estimated Date of Delivery: 03/30/21 by by 9 week ultrasound however trying to make it to 34w for planned c-section, Type 1 diabetic, Cardiomyopathy, chronic systolic heart failure, DVT in bilateral arm. H/o recent hospitalizations d/t hyperemesis and sepsis secondary to pneumonia. Pt delivered by C-section  and Bilateral Salpingectomy on 02/17/21.    PT Comments    Pt now s/p C-section. Able to mobilize in hallway today. Should continue to progress. Will continue to emphasize building activity tolerance in preparation for return home.    Recommendations for follow up therapy are one component of a multi-disciplinary discharge planning process, led by the attending physician.  Recommendations may be updated based on patient status, additional functional criteria and insurance authorization.  Follow Up Recommendations  No PT follow up     Equipment Recommendations  None recommended by PT    Recommendations for Other Services       Precautions / Restrictions Precautions Precautions: Other (comment) Precaution Comments: c-section     Mobility  Bed Mobility Overal bed mobility: Needs Assistance Bed Mobility: Sidelying to Sit   Sidelying to sit: Supervision;HOB elevated       General bed mobility comments: Incr time and effort due to incisional pain    Transfers Overall transfer level: Needs assistance Equipment used: 4-wheeled walker Transfers: Sit to/from Stand Sit to Stand: Supervision         General transfer comment: supervision for safety. Incr time  Ambulation/Gait Ambulation/Gait assistance:  Supervision Gait Distance (Feet): 300 Feet Assistive device: 4-wheeled walker Gait Pattern/deviations: Step-through pattern;Wide base of support;Trunk flexed Gait velocity: decreased Gait velocity interpretation: 1.31 - 2.62 ft/sec, indicative of limited community ambulator General Gait Details: Assist for safety. One sitting rest break. slight trunk flexion due to incisional pain   Stairs             Wheelchair Mobility    Modified Rankin (Stroke Patients Only)       Balance Overall balance assessment: No apparent balance deficits (not formally assessed)                                          Cognition Arousal/Alertness: Awake/alert Behavior During Therapy: WFL for tasks assessed/performed                                          Exercises      General Comments        Pertinent Vitals/Pain Pain Assessment: Faces Faces Pain Scale: Hurts little more Pain Location: abdominal incision Pain Descriptors / Indicators: Operative site guarding;Grimacing Pain Intervention(s): Limited activity within patient's tolerance;Premedicated before session    Home Living                      Prior Function            PT Goals (current goals can now be found in the care plan section) Acute Rehab PT Goals Patient Stated Goal: return home PT Goal Formulation: With patient Time For Goal Achievement:  02/25/21 Potential to Achieve Goals: Good Progress towards PT goals: Goals met and updated - see care plan    Frequency    Min 2X/week      PT Plan Current plan remains appropriate    Co-evaluation              AM-PAC PT "6 Clicks" Mobility   Outcome Measure  Help needed turning from your back to your side while in a flat bed without using bedrails?: None Help needed moving from lying on your back to sitting on the side of a flat bed without using bedrails?: None Help needed moving to and from a bed to a chair  (including a wheelchair)?: A Little Help needed standing up from a chair using your arms (e.g., wheelchair or bedside chair)?: A Little Help needed to walk in hospital room?: A Little Help needed climbing 3-5 steps with a railing? : A Little 6 Click Score: 20    End of Session   Activity Tolerance: Patient tolerated treatment well Patient left: in bed;with call bell/phone within reach (sitting EOB) Nurse Communication: Mobility status PT Visit Diagnosis: Other abnormalities of gait and mobility (R26.89);Muscle weakness (generalized) (M62.81) Pain - part of body:  (abdomen)     Time: 0459-1368 PT Time Calculation (min) (ACUTE ONLY): 14 min  Charges:  $Gait Training: 8-22 mins                     Martin Pager [phone] Office [place] 02/18/2021, 2:45 PM

## 2021-02-18 NOTE — Progress Notes (Signed)
PROGRESS NOTE  Ashley Camacho RJJ:[id number] DOB: January 24, 1991   PCP: Hildred Laser, MD  Patient is from: Home.  DOA: 12/16/2020 LOS: 64  Chief complaints:  No chief complaint on file.    Brief Narrative / Interim history: 30 y.o. G2P0101 with a history of T1DM, chronic HFrEF and recent hospitalization at [facility] on 6/30 for N/V and rhinovirus pneumonia requiring transfer to [facility].  She was discharged home from [facility] on 7/20 only to return on 7/22 with starvation ketoacidosis in the setting of nausea and vomiting likely due to uncontrolled DM-1 and gastroparesis, and brachial vein DVT.She was a started on TPN through PICC line and transferred to [facility] for cortrak and TF initiation.   Hospital course noteworthy of systolic CHF exacerbation (on IV diuretics), AI (p.o. Cortef), ABLA, hematemesis in the setting of grade C esophagitis on EGD, hematuria (seems to have resolved), moderate right hydronephrosis (improved), Enterococcus faecalis UTI (completed treatment), acute urinary retention (resolved)...  Patient underwent cesarean section on 9/27.   Subjective: Seen and examined earlier this afternoon.  No major events overnight of this morning.  No complaints.  She denies chest pain, dyspnea, orthopnea, GI or UTI symptoms.  Still with indwelling Foley catheter after CS.  Surgical pain fairly controlled.  Objective: Vitals:   02/18/21 1120 02/18/21 1142 02/18/21 1323 02/18/21 1325  BP:  126/73 (!) 149/79   Pulse:  72 79   Resp:  16 16   Temp:  97.7 F (36.5 C) 97.6 F (36.4 C)   TempSrc:  Oral Axillary   SpO2: 98% 98%  98%  Weight:      Height:        Intake/Output Summary (Last 24 hours) at 02/18/2021 1336 Last data filed at 02/18/2021 1321 Gross per 24 hour  Intake 1779 ml  Output 1891 ml  Net -112 ml   Filed Weights   01/24/21 0603 01/26/21 0530 02/08/21 1157  Weight: 81.7 kg 80.6 kg 76.1 kg    Examination  GENERAL: No apparent distress.  Nontoxic. HEENT: MMM.  Vision and  hearing grossly intact.  NECK: Supple.  No apparent JVD.  RESP:  No IWOB.  Fair aeration bilaterally. CVS:  RRR. Heart sounds normal.  ABD/GI/GU: BS+. Abd soft.  Mild tenderness MSK/EXT:  Moves extremities. No apparent deformity.  Trace edema in BLE. SKIN: no apparent skin lesion or wound NEURO: Awake and alert. Oriented appropriately.  No apparent focal neuro deficit. PSYCH: Calm. Normal affect.   Procedures:  8/24-EGD with grade C esophagitis. 9/27-cesarean section  Microbiology summarized: 7/30-wet prep negative. 7/30-urine culture with insignificant growth. 8/15-urine culture with pansensitive Enterococcus faecalis 8/22-C. difficile negative 9/6 & 7-urine culture with multiple species 9/9-urine culture negative  Assessment & Plan: Volume overload/acute systolic CHF with improved EF-improved.  Denies cardiopulmonary symptoms.  I and O incomplete but about 725 cc charted.  Net -21 L so far.  Creatinine improved. -IV Lasix 40 mg between transfusions -Closely monitor fluid status, renal functions and electrolytes -Cardiology signed off.  Intractable nausea/vomiting-could be due to possible gastroparesis, AI, and esophagitis.  Resolved. -Treat treatable causes -Continue Reglan and Zofran as needed  Adrenal insufficiency: low cortisol level with minimal to no response to ACTH suggests primary AI.  ACTH level low but drawn after she started stress dose steroid.  -On stress dose steroid perioperatively  -Will wean to maintenance dose POD day 3. -Outpatient follow-up with endocrinology after discharge  ABLA due to surgical blood loss-about 999 cc charted.  Received 1 unit perioperatively. Recent  Labs    02/09/21 0822 02/10/21 0433 02/11/21 0420 02/12/21 0527 02/13/21 0523 02/14/21 0430 02/15/21 0500 02/16/21 0319 02/17/21 0500 02/18/21 0803  HGB 8.3* 8.1* 8.3* 7.9* 8.2* 8.2* 8.6* 8.2* 7.9* 6.5*  -Transfuse 2 units -IV Lasix 40 mg x 1 between transfusions -Continue  monitoring  Hematemesis could be due to grade C esophagitis as noted on EGD on 8/24.  Resolved. -Continue Protonix and Carafate   Gross hematuria, unclear etiology:  Renal US with hydronephrosis.  Hematuria resolved.  H&H stable.  -Consider repeat renal US to see if hydronephrosis has resolved -Monitor H&H  Acute urinary tension/bilateral hydronephrosis-moderate right and mild left.  Urinary tension resolved. -As above.  Controlled DM-1 with possible gastroparesis: A1c 5.8% on 7/1. Recent Labs  Lab 02/16/21 0437 02/17/21 0435 02/17/21 1027 02/18/21 0504 02/18/21 0859  GLUCAP 124* 113* 75 118* 120*  -Continue insulin drip while on stress dose steroid, and transition to subcu insulin  -Continue Reglan for possible gastroparesis  AKI/azotemia: Likely from diuretics.  Improved. Recent Labs    02/09/21 0822 02/10/21 0433 02/11/21 1112 02/12/21 0527 02/13/21 0523 02/14/21 0656 02/15/21 0500 02/16/21 0319 02/17/21 0642 02/18/21 0803  BUN 24* 27* 32* 32* 36* 32* 32* 37* 36* 33*  CREATININE 1.35* 1.30* 1.48* 1.39* 1.36* 1.32* 1.60* 1.70* 1.49* 1.22*  -Monitor urine output and renal function  Elevated liver enzymes: Total bili within normal.  Stable. Recent Labs  Lab 02/12/21 0527 02/13/21 0523 02/14/21 0656 02/15/21 0500 02/16/21 0319 02/17/21 0642 02/18/21 0803  AST 43* 43*  --  45*  --   --   --   ALT 48* 53*  --  59*  --   --   --   ALKPHOS 83 77  --  78  --   --   --   BILITOT 0.7 0.6  --  1.0  --   --   --   PROT 5.1* 5.1*  --  5.2*  --   --   --   ALBUMIN 2.4* 2.5* 2.4* 2.5* 2.5* 2.5* 2.2*    Essential hypertension: Relatively stable. -Continue Pradaxa and hydralazine  Chronic insomnia -On Unisom 12.5 mg twice daily   Acute thrombocytopenia: Resolved.   Right brachial vein DVT: Dx 12/07/20: seems to be recannulating based on repeat U/S.  -Heparin to be hold preoperatively   Hypokalemia/hypomagnesemia: Resolved. -Monitor   Third trimester  intrauterine pregnancy: s/p successful C-section   Enterococcus faecalis UTI-completed antibiotic course on 8/22.   Limited venous access:  - PICC reinserted 8/30.   Epistaxis/vaginal bleeding: Seems to have resolved.  Inadequate oral intake Body mass index is 31.71 kg/m. Nutrition Problem: Inadequate oral intake Etiology: nausea, vomiting Signs/Symptoms: NPO status Interventions: TPN, Tube feeding   DVT prophylaxis:  SCD's Start: 02/17/21 1953 Place TED hose Start: 01/17/21 1655 Patient is on heparin drip for DVT.  Code Status: Full code Family Communication: Patient and/or RN.  Updated patient's significant other and patient's daughter at bedside Level of care: Antepartum Status is: Inpatient   Dispo: Per primary   Sch Meds:  Scheduled Meds:  sodium chloride   Intravenous Once   acetaminophen  1,000 mg Oral Q6H   Chlorhexidine Gluconate Cloth  6 each Topical Daily   cholecalciferol  1,000 Units Oral Daily   vitamin B-6  12.5 mg Oral BID   And   doxylamine (Sleep)  12.5 mg Oral BID   DULoxetine  20 mg Oral Daily   enoxaparin (LOVENOX) injection  60 mg Subcutaneous Q12H  famotidine  20 mg Oral BID   feeding supplement (GLUCERNA SHAKE)  237 mL Oral BID BM   hydrALAZINE  75 mg Oral Q8H   [START ON 02/20/2021] hydrocortisone  25 mg Oral Q24H   And   [START ON 02/20/2021] hydrocortisone  15 mg Oral Q24H   hydrocortisone sod succinate (SOLU-CORTEF) inj  50 mg Intravenous Q8H   Followed by   Melene Muller ON 02/19/2021] hydrocortisone sod succinate (SOLU-CORTEF) inj  50 mg Intravenous Q12H   measles, mumps & rubella vaccine  0.5 mL Subcutaneous Once   mouth rinse  15 mL Mouth Rinse q12n4p   metoCLOPramide  5 mg Oral TID AC & HS   NIFEdipine  30 mg Oral Daily   pantoprazole  40 mg Oral Daily   potassium chloride  40 mEq Oral Daily   prenatal multivitamin  1 tablet Oral Q1200   prenatal vitamin w/FE, FA  1 tablet Oral Q1200   scopolamine  1 patch Transdermal Once    senna-docusate  2 tablet Oral Daily   simethicone  80 mg Oral TID PC   sodium chloride flush  10-40 mL Intracatheter Q12H   sucralfate  1 g Oral TID WC & HS   Tdap  0.5 mL Intramuscular Once   terbutaline  0.25 mg Subcutaneous Once   vitamin B-12  1,000 mcg Oral Daily   Continuous Infusions:  insulin 2 Units/hr (02/18/21 1327)   naLOXone (NARCAN) adult infusion for PRURITIS     PRN Meds:.coconut oil, witch hazel-glycerin **AND** dibucaine, diphenhydrAMINE, diphenhydrAMINE **OR** diphenhydrAMINE, diphenhydrAMINE-zinc acetate, Glycerin (Adult), menthol-cetylpyridinium, nalbuphine **OR** nalbuphine, nalbuphine **OR** nalbuphine, naloxone **AND** sodium chloride flush, naLOXone (NARCAN) adult infusion for PRURITIS, NIFEdipine, ondansetron (ZOFRAN) IV, ondansetron, oxyCODONE, oxyCODONE-acetaminophen, prochlorperazine, senna, simethicone, sodium chloride, sodium chloride flush, zolpidem  Antimicrobials: Anti-infectives (From admission, onward)    Start     Dose/Rate Route Frequency Ordered Stop   01/30/21 1315  amoxicillin (AMOXIL) capsule 500 mg  Status:  Discontinued        500 mg Oral 2 times daily 01/30/21 1218 02/01/21 1426   01/30/21 1200  ampicillin (OMNIPEN) 2 g in sodium chloride 0.9 % 100 mL IVPB  Status:  Discontinued        2 g 300 mL/hr over 20 Minutes Intravenous Every 6 hours 01/30/21 1105 01/30/21 1218   01/06/21 1630  amoxicillin (AMOXIL) 250 MG/5ML suspension 500 mg        500 mg Per Tube Every 8 hours 01/06/21 1534 01/13/21 0602   01/06/21 1615  amoxicillin (AMOXIL) chewable tablet 500 mg  Status:  Discontinued        500 mg Oral Every 8 hours 01/06/21 1528 01/06/21 1532        I have personally reviewed the following labs and images: CBC: Recent Labs  Lab 02/14/21 0430 02/15/21 0500 02/16/21 0319 02/17/21 0500 02/18/21 0803  WBC 6.9 6.4 6.4 7.4 11.4*  HGB 8.2* 8.6* 8.2* 7.9* 6.5*  HCT 25.2* 26.0* 25.3* 24.3* 19.7*  MCV 95.5 94.5 96.6 96.4 94.7  PLT 173 180  180 161 133*   BMP &GFR Recent Labs  Lab 02/14/21 0656 02/15/21 0500 02/16/21 0319 02/17/21 0500 02/17/21 0642 02/18/21 0803  NA 137 136 137  --  135 138  K 3.7 4.0 4.1  --  4.3 4.2  CL 101 99 101  --  102 105  CO2 29 30 29   --  27 27  GLUCOSE 94 143* 126*  --  261* 124*  BUN 32* 32* 37*  --  36* 33*  CREATININE 1.32* 1.60* 1.70*  --  1.49* 1.22*  CALCIUM 8.4* 8.5* 8.6*  --  8.4* 8.1*  MG 2.2 2.1 2.1 2.3  --  2.0  PHOS 3.7 3.4 3.1  --  3.1 5.2*   Estimated Creatinine Clearance: 63.5 mL/min (A) (by C-G formula based on SCr of 1.22 mg/dL (H)). Liver & Pancreas: Recent Labs  Lab 02/12/21 0527 02/13/21 0523 02/14/21 0656 02/15/21 0500 02/16/21 0319 02/17/21 0642 02/18/21 0803  AST 43* 43*  --  45*  --   --   --   ALT 48* 53*  --  59*  --   --   --   ALKPHOS 83 77  --  78  --   --   --   BILITOT 0.7 0.6  --  1.0  --   --   --   PROT 5.1* 5.1*  --  5.2*  --   --   --   ALBUMIN 2.4* 2.5* 2.4* 2.5* 2.5* 2.5* 2.2*   No results for input(s): LIPASE, AMYLASE in the last 168 hours. No results for input(s): AMMONIA in the last 168 hours. Diabetic: No results for input(s): HGBA1C in the last 72 hours. Recent Labs  Lab 02/16/21 0437 02/17/21 0435 02/17/21 1027 02/18/21 0504 02/18/21 0859  GLUCAP 124* 113* 75 118* 120*   Cardiac Enzymes: No results for input(s): CKTOTAL, CKMB, CKMBINDEX, TROPONINI in the last 168 hours. No results for input(s): PROBNP in the last 8760 hours. Coagulation Profile: No results for input(s): INR, PROTIME in the last 168 hours. Thyroid Function Tests: No results for input(s): TSH, T4TOTAL, FREET4, T3FREE, THYROIDAB in the last 72 hours. Lipid Profile: No results for input(s): CHOL, HDL, LDLCALC, TRIG, CHOLHDL, LDLDIRECT in the last 72 hours. Anemia Panel: No results for input(s): VITAMINB12, FOLATE, FERRITIN, TIBC, IRON, RETICCTPCT in the last 72 hours. Urine analysis:    Component Value Date/Time   COLORURINE AMBER (A) 01/10/2021 0131    APPEARANCEUR CLOUDY (A) 01/10/2021 0131   APPEARANCEUR Clear 09/18/2020 1153   LABSPEC 1.017 01/10/2021 0131   LABSPEC 1.026 03/01/2013 1844   PHURINE 5.0 01/10/2021 0131   GLUCOSEU NEGATIVE 01/10/2021 0131   GLUCOSEU >=500 03/01/2013 1844   HGBUR NEGATIVE 01/10/2021 0131   BILIRUBINUR NEGATIVE 01/10/2021 0131   BILIRUBINUR neg 10/23/2020 0953   BILIRUBINUR Negative 09/18/2020 1153   BILIRUBINUR Negative 03/01/2013 1844   KETONESUR NEGATIVE 01/10/2021 0131   PROTEINUR NEGATIVE 01/10/2021 0131   UROBILINOGEN 0.2 10/23/2020 0953   NITRITE NEGATIVE 01/10/2021 0131   LEUKOCYTESUR SMALL (A) 01/10/2021 0131   LEUKOCYTESUR Negative 03/01/2013 1844   Sepsis Labs: Invalid input(s): PROCALCITONIN, LACTICIDVEN  Microbiology: Recent Results (from the past 240 hour(s))  Resp Panel by RT-PCR (Flu A&B, Covid) Nasopharyngeal Swab     Status: None   Collection Time: 02/17/21  6:27 AM   Specimen: Nasopharyngeal Swab; Nasopharyngeal(NP) swabs in vial transport medium  Result Value Ref Range Status   SARS Coronavirus 2 by RT PCR NEGATIVE NEGATIVE Final    Comment: (NOTE) SARS-CoV-2 target nucleic acids are NOT DETECTED.  The SARS-CoV-2 RNA is generally detectable in upper respiratory specimens during the acute phase of infection. The lowest concentration of SARS-CoV-2 viral copies this assay can detect is 138 copies/mL. A negative result does not preclude SARS-Cov-2 infection and should not be used as the sole basis for treatment or other patient management decisions. A negative result may occur with  improper specimen collection/handling, submission of specimen other than nasopharyngeal swab,  presence of viral mutation(s) within the areas targeted by this assay, and inadequate number of viral copies(<138 copies/mL). A negative result must be combined with clinical observations, patient history, and epidemiological information. The expected result is Negative.  Fact Sheet for Patients:   BloggerCourse.com  Fact Sheet for Healthcare Providers:  SeriousBroker.it  This test is no t yet approved or cleared by the [place] FDA and  has been authorized for detection and/or diagnosis of SARS-CoV-2 by FDA under an Emergency Use Authorization (EUA). This EUA will remain  in effect (meaning this test can be used) for the duration of the COVID-19 declaration under Section 564(b)(1) of the Act, 21 U.S.C.section 360bbb-3(b)(1), unless the authorization is terminated  or revoked sooner.       Influenza A by PCR NEGATIVE NEGATIVE Final   Influenza B by PCR NEGATIVE NEGATIVE Final    Comment: (NOTE) The Xpert Xpress SARS-CoV-2/FLU/RSV plus assay is intended as an aid in the diagnosis of influenza from Nasopharyngeal swab specimens and should not be used as a sole basis for treatment. Nasal washings and aspirates are unacceptable for Xpert Xpress SARS-CoV-2/FLU/RSV testing.  Fact Sheet for Patients: BloggerCourse.com  Fact Sheet for Healthcare Providers: SeriousBroker.it  This test is not yet approved or cleared by the [place] FDA and has been authorized for detection and/or diagnosis of SARS-CoV-2 by FDA under an Emergency Use Authorization (EUA). This EUA will remain in effect (meaning this test can be used) for the duration of the COVID-19 declaration under Section 564(b)(1) of the Act, 21 U.S.C. section 360bbb-3(b)(1), unless the authorization is terminated or revoked.  Performed at [facility], [address], [place], [place] [address]     Radiology Studies: No results found.   Ikea Demicco T[facility]  If 7PM-7AM, please contact night-coverage [email or web address] 02/18/2021, 1:36 PM

## 2021-02-18 NOTE — Lactation Note (Addendum)
This note was copied from a baby's chart.  Lactation Consultation Note  Patient Name: Girl Kirby Argueta [id number] Date: 02/18/2021 Age:30 years   Subjective Reason for consult: Initial assessment; NICU baby; Maternal endocrine disorder  Objective Infant data: Mother's Current Feeding Choice: Breast Milk  Maternal data: [id number]  C-Section, Low Transverse Significant Breast History:: wide-spacing  Does the patient have breastfeeding experience prior to this delivery?: No   Assessment Infant:  Maternal: Mother is at risk for low milk supply.  Intervention/Plan Interventions: Education; [employer] brochure; Hand express  Plan: Consult Status: Follow-up  NICU Follow-up type: New admission follow up  Pt to pump 15 minutes q3 and bring EBM to NICU. Pt to clean pump pieces q each use.  Elder Negus 02/18/2021, 12:19 PM

## 2021-02-19 LAB — TYPE AND SCREEN
ABO/RH(D): A POS
Antibody Screen: NEGATIVE
Unit division: 0
Unit division: 0
Unit division: 0

## 2021-02-19 LAB — BPAM RBC
Blood Product Expiration Date: 202209302359
Blood Product Expiration Date: 202210252359
Blood Product Expiration Date: 202210272359
ISSUE DATE / TIME: 202209270920
ISSUE DATE / TIME: 202209281053
ISSUE DATE / TIME: 202209281429
Unit Type and Rh: 6200
Unit Type and Rh: 6200
Unit Type and Rh: 6200

## 2021-02-19 LAB — CBC
HCT: 28.5 % — ABNORMAL LOW (ref 36.0–46.0)
Hemoglobin: 9.6 g/dL — ABNORMAL LOW (ref 12.0–15.0)
MCH: 29.5 pg (ref 26.0–34.0)
MCHC: 33.7 g/dL (ref 30.0–36.0)
MCV: 87.7 fL (ref 80.0–100.0)
Platelets: 154 10*3/uL (ref 150–400)
RBC: 3.25 MIL/uL — ABNORMAL LOW (ref 3.87–5.11)
RDW: 18.3 % — ABNORMAL HIGH (ref 11.5–15.5)
WBC: 13.7 10*3/uL — ABNORMAL HIGH (ref 4.0–10.5)
nRBC: 0 % (ref 0.0–0.2)

## 2021-02-19 LAB — RENAL FUNCTION PANEL
Albumin: 2.3 g/dL — ABNORMAL LOW (ref 3.5–5.0)
Anion gap: 7 (ref 5–15)
BUN: 30 mg/dL — ABNORMAL HIGH (ref 6–20)
CO2: 25 mmol/L (ref 22–32)
Calcium: 8.4 mg/dL — ABNORMAL LOW (ref 8.9–10.3)
Chloride: 104 mmol/L (ref 98–111)
Creatinine, Ser: 1.18 mg/dL — ABNORMAL HIGH (ref 0.44–1.00)
GFR, Estimated: >60 mL/min (ref >60)
Glucose, Bld: 120 mg/dL — ABNORMAL HIGH (ref 70–99)
Phosphorus: 5 mg/dL — ABNORMAL HIGH (ref 2.5–4.6)
Potassium: 4.3 mmol/L (ref 3.5–5.1)
Sodium: 136 mmol/L (ref 135–145)

## 2021-02-19 LAB — GLUCOSE, CAPILLARY: Glucose-Capillary: 127 mg/dL — ABNORMAL HIGH (ref 70–99)

## 2021-02-19 LAB — MAGNESIUM: Magnesium: 2.4 mg/dL (ref 1.7–2.4)

## 2021-02-19 LAB — SURGICAL PATHOLOGY

## 2021-02-19 MED ORDER — NIFEDIPINE ER OSMOTIC RELEASE 60 MG PO TB24
60.0000 mg | ORAL_TABLET | Freq: Every day | ORAL | Status: DC
  Administered 2021-02-19 – 2021-02-21 (×3): 60 mg via ORAL
  Filled 2021-02-19 (×3): qty 1

## 2021-02-19 MED ORDER — DEXTROSE 10 % IV SOLN: INTRAVENOUS | Status: DC

## 2021-02-19 MED FILL — Sodium Chloride IV Soln 0.9%: INTRAVENOUS | Qty: 1000 | Status: AC

## 2021-02-19 MED FILL — Heparin Sodium (Porcine) Inj 1000 Unit/ML: INTRAMUSCULAR | Qty: 30 | Status: AC

## 2021-02-19 NOTE — Clinical Social Work Maternal (Signed)
CLINICAL SOCIAL WORK MATERNAL/CHILD NOTE  Patient Details  Name: Ashley Camacho MRN: [id number] Date of Birth: 1991-04-23  Date:  02/19/2021  Clinical Social Worker Initiating Note:  Abundio Miu, [place] Date/Time: Initiated:  02/19/21/1408     Child's Name:  Elwyn Reach   Biological Parents:  Mother, Father (Father: Annitta Jersey)   Need for Interpreter:  None   Reason for Referral:  Behavioral Health Concerns, Parental Support of Premature Babies < 32 weeks/or Critically Ill babies   Address:  [place]  [address]    Phone number:  [phone] (home)     Additional phone number:   Household Members/Support Persons (HM/SP):   Household Member/Support Person 1, Household Member/Support Person 2, Household Member/Support Person 3, Household Member/Support Person 4, Household Member/Support Person 5   HM/SP Name Relationship DOB or Age  HM/SP -Magee daughter 12/13/10  HM/SP -3   FOB's brother    HM/SP -4   FOB's sister    HM/SP -33   FOB's sister's kids    HM/SP -6        HM/SP -7        HM/SP -8          Natural Supports (not living in the home):      Professional Supports: None   Employment: Unemployed   Type of Work:     Education:  9 to 11 years (10th)   Homebound arranged: No  Financial Resources:  Medicaid   Other Resources:  [employer]   Cultural/Religious Considerations Which May Impact Care:    Strengths:  Ability to meet basic needs  , Engineer, materials, Understanding of illness, Home prepared for child     Psychotropic Medications:         Pediatrician:    [employer]  Pediatrician List:   [facility]      Pediatrician Fax Number:    Risk Factors/Current Problems:  Mental Health Concerns     Cognitive State:  Able to Concentrate  , Alert  , Linear Thinking  , Insightful  ,  Goal Oriented     Mood/Affect:  Calm  , Interested  , Comfortable     CSW Assessment: CSW met with MOB at infant's bedside to complete psychosocial assessment. CSW introduced self and explained role. MOB was welcoming and remained engaged during assessment. MOB reported that she resides with FOB, older daughter, FOB's brother, FOB's sister, and FOB's sister's kids. MOB reported that she receives [facility] and has all items needed to care for infant. MOB reported that she has a car seat and basinet/pack and play. CSW inquired about MOB's support system, MOB reported that FOB and FOB's sister are supports.   CSW inquired about MOB's mental health history. MOB reported that she was diagnosed with anxiety and depression 2 years ago, noting it was situational. MOB shared that she also experienced anxiety and depression at 30 years old after losing her dad. CSW offered condolences. MOB denied any current symptoms. MOB reported that she is not participating in therapy nor taking medication to treat mental health diagnoses. MOB denied any history of postpartum depression. CSW inquired about how MOB was feeling emotionally after giving birth, MOB reported that she was feeling good. MOB presented calm and did not demonstrate any acute mental health signs/symptoms.  CSW assessed for safety, MOB denied SI, HI, and domestic violence.   CSW provided education regarding the baby blues period vs. perinatal mood disorders, discussed treatment and gave resources for mental health follow up if concerns arise.  CSW recommends self-evaluation during the postpartum time period using the New Mom Checklist from Postpartum Progress and encouraged MOB to contact a medical professional if symptoms are noted at any time.    CSW provided review of Sudden Infant Death Syndrome (SIDS) precautions.    CSW and MOB discussed infant's NICU admission. CSW informed MOB about the NICU, what to expect, and resources/supports available while infant  is admitted to the NICU. MOB reported that she felt mostly informed about infant's care. MOB reported that meal vouchers would be helpful, CSW provided 7 meal vouchers. MOB denied any transportation barriers with visiting infant in the NICU. MOB denied any questions/concerns regarding the NICU.   CSW inquired about any stressors for MOB, MOB reported being hospitalized for so Ewing Fandino was a stressor. CSW acknowledged and validated MOB's feelings surrounding her experience. MOB reported that she has all items needed to care for her health at home.   CSW will continue to offer resources/supports while infant is admitted to the NICU.    CSW Plan/Description:  Sudden Infant Death Syndrome (SIDS) Education, Perinatal Mood and Anxiety Disorder (PMADs) Education, Psychosocial Support and Ongoing Assessment of Needs, Other Patient/Family Education    Burnis Medin, LCSW 02/19/2021, 2:28 PM

## 2021-02-19 NOTE — Lactation Note (Signed)
This note was copied from a baby's chart. Lactation Consultation Note  Patient Name: Ashley Camacho [id number] Date: 02/19/2021 Reason for consult: Follow-up assessment;NICU baby;Late-preterm 34-36.6wks Age:30 hours  I followed up with Ashley Camacho. She has been slightly under-pumping at every 3-4 hours. I reviewed recommendations for pumping every 2-3 hours by day and every 3-4 hours at night. [name] is now 36 hours old; she has not seen any changes in her milk volume. I reviewed normal milk production expectations for days 1-4.   [name] has labels and cleaning supplies. She states that she understands how to use her pump and does not need any additional supplies at this time.   Maternal Data Has patient been taught Hand Expression?: Yes  Feeding Mother's Current Feeding Choice: Breast Milk and Donor Milk   Lactation Tools Discussed/Used Breast pump type: Double-Electric Breast Pump Pump Education: Setup, frequency, and cleaning Reason for Pumping: NICU Pumping frequency: q3-4 - recommended 2-3 during the day and 3-4 during the night Pumped volume: 0 mL  Interventions Interventions: Education  Discharge Pump: DEBP;Personal  Consult Status Consult Status: Follow-up Date: 02/19/21 Follow-up type: In-patient    Walker Shadow 02/19/2021, 12:01 PM

## 2021-02-19 NOTE — Progress Notes (Signed)
POSTPARTUM PROGRESS NOTE  POD #2  Subjective:  Ashley Camacho is a 30 y.o. [id number] s/p repeat C-section at [redacted]w[redacted]d. Today she notes no acute complaints. She denies any problems with ambulating, voiding or po intake. Denies nausea or vomiting. She hasnot yet passed flatus, no BM.  Pain is minimal- notes her pain a 3/10.  Lochia minimal Denies fever/chills/chest pain/SOB.  no HA, no blurry vision, noRUQ pain  Objective: Blood pressure (!) 148/73, pulse 89, temperature 98.2 F (36.8 C), temperature source Oral, resp. rate 16, height 5\' 1"  (1.549 m), weight 76.1 kg, SpO2 99 %, unknown if currently breastfeeding.  Physical Exam:  General: alert, cooperative and no distress Chest: no respiratory distress, CTAB Heart: regular rate and rhythm Abdomen: soft, nontender, +BS Uterine Fundus: firm, appropriately tender Incision: C/D/I with honeycomb DVT Evaluation: No calf swelling or tenderness Extremities: minimal edema Skin: warm, dry  Results for orders placed or performed during the hospital encounter of 12/16/20 (from the past 24 hour(s))  CBC     Status: Abnormal   Collection Time: 02/18/21  8:03 AM  Result Value Ref Range   WBC 11.4 (H) 4.0 - 10.5 K/uL   RBC 2.08 (L) 3.87 - 5.11 MIL/uL   Hemoglobin 6.5 (LL) 12.0 - 15.0 g/dL   HCT 02/20/21 (L) 47.6 - 54.6 %   MCV 94.7 80.0 - 100.0 fL   MCH 31.3 26.0 - 34.0 pg   MCHC 33.0 30.0 - 36.0 g/dL   RDW 50.3 (H) 54.6 - 56.8 %   Platelets 133 (L) 150 - 400 K/uL   nRBC 0.0 0.0 - 0.2 %  Renal function panel     Status: Abnormal   Collection Time: 02/18/21  8:03 AM  Result Value Ref Range   Sodium 138 135 - 145 mmol/L   Potassium 4.2 3.5 - 5.1 mmol/L   Chloride 105 98 - 111 mmol/L   CO2 27 22 - 32 mmol/L   Glucose, Bld 124 (H) 70 - 99 mg/dL   BUN 33 (H) 6 - 20 mg/dL   Creatinine, Ser 02/20/21 (H) 0.44 - 1.00 mg/dL   Calcium 8.1 (L) 8.9 - 10.3 mg/dL   Phosphorus 5.2 (H) 2.5 - 4.6 mg/dL   Albumin 2.2 (L) 3.5 - 5.0 g/dL   GFR, Estimated 5.17 >00  mL/min   Anion gap 6 5 - 15  Magnesium     Status: None   Collection Time: 02/18/21  8:03 AM  Result Value Ref Range   Magnesium 2.0 1.7 - 2.4 mg/dL  Glucose, capillary     Status: Abnormal   Collection Time: 02/18/21  8:59 AM  Result Value Ref Range   Glucose-Capillary 120 (H) 70 - 99 mg/dL   Comment 1 Notify RN    Comment 2 Document in Chart   Prepare RBC (crossmatch)     Status: None   Collection Time: 02/18/21  9:45 AM  Result Value Ref Range   Order Confirmation      BB SAMPLE OR UNITS ALREADY AVAILABLE Performed at [facility], [address], [place], [place]   Prepare RBC (crossmatch)     Status: None   Collection Time: 02/18/21 10:22 AM  Result Value Ref Range   Order Confirmation      ORDER PROCESSED BY BLOOD BANK BB SAMPLE OR UNITS ALREADY AVAILABLE Performed at [facility], [address] [place], [place]   CBC     Status: Abnormal   Collection Time: 02/19/21  5:30 AM  Result Value Ref Range   WBC 13.7 (H) 4.0 - 10.5 K/uL   RBC 3.25 (L) 3.87 - 5.11 MIL/uL   Hemoglobin 9.6 (L) 12.0 - 15.0 g/dL   HCT 25.8 (L) 52.7 - 78.2 %   MCV 87.7 80.0 - 100.0 fL   MCH 29.5 26.0 - 34.0 pg   MCHC 33.7 30.0 - 36.0 g/dL   RDW 42.3 (H) 53.6 - 14.4 %   Platelets 154 150 - 400 K/uL   nRBC 0.0 0.0 - 0.2 %  Magnesium     Status: None   Collection Time: 02/19/21  5:30 AM  Result Value Ref Range   Magnesium 2.4 1.7 - 2.4 mg/dL  Type and screen [facility] Draw Type and Screen with other 5:00am labs     Status: None   Collection Time: 02/19/21  5:30 AM  Result Value Ref Range   ABO/RH(D) A POS    Antibody Screen NEG    Sample Expiration      02/22/2021,2359 Performed at [facility], [address], [place], [place] [address]   Renal function panel     Status: Abnormal   Collection Time: 02/19/21  5:30 AM  Result Value Ref Range   Sodium 136 135 - 145 mmol/L   Potassium 4.3 3.5 - 5.1 mmol/L   Chloride 104 98 - 111  mmol/L   CO2 25 22 - 32 mmol/L   Glucose, Bld 120 (H) 70 - 99 mg/dL   BUN 30 (H) 6 - 20 mg/dL   Creatinine, Ser 0.86 (H) 0.44 - 1.00 mg/dL   Calcium 8.4 (L) 8.9 - 10.3 mg/dL   Phosphorus 5.0 (H) 2.5 - 4.6 mg/dL   Albumin 2.3 (L) 3.5 - 5.0 g/dL   GFR, Estimated >76 >19 mL/min   Anion gap 7 5 - 15  Glucose, capillary     Status: Abnormal   Collection Time: 02/19/21  6:18 AM  Result Value Ref Range   Glucose-Capillary 127 (H) 70 - 99 mg/dL    Assessment/Plan: Ashley Camacho is a 30 y.o. G2P0202 s/p repeat C-section at [redacted]w[redacted]d POD#2 complicated by:  1) Heme -s/p 2u 9/28- total of 3upRBCs postpartum -appropriate increase in Hgb -Vitals stable, pt asymptomatic  2) GI -nausea/vomiting resolved- discontinued B6/unisom, will discuss weening off anti-emetics with IM team -tolerating general diet -encouraged ambulation and mylicon to assist with flatus -previously had hematemesis- on protonix and carafate  3) GU -voiding freely, UOP adequate  4) ENDO DM-1 -management per IM team -currently on insulin drip while on stress dose steroids Adrenal insufficiency -on stress dose steroids -management per IM team  5) Cardio HTN -will increase procardia to 60XL daily -continue hydralazine daily  Right brachial vein DVT: Dx 12/07/20 DVT prophylaxis  Continue Lovenox twice daily  6) Renal -AKI improving, labs as above   Contraception: plan to review Feeding: baby in NICU  Dispo: Continue management as outlined above, appreciate co-management with IM team   LOS: 65 days   Ashley Hidalgo, DO Faculty Attending, [facility] 02/19/2021, 7:08 AM

## 2021-02-19 NOTE — Progress Notes (Signed)
PROGRESS NOTE  Ashley Camacho TDH:[id number] DOB: [birth year]   PCP: Hildred Laser, MD  Patient is from: Home.  DOA: 12/16/2020 LOS: 65  Chief complaints:  No chief complaint on file.    Brief Narrative / Interim history: 30 y.o. G2P0101 with a history of T1DM, chronic HFrEF and recent hospitalization at [facility] on 6/30 for N/V and rhinovirus pneumonia requiring transfer to [facility].  She was discharged home from [facility] on 7/20 only to return on 7/22 with starvation ketoacidosis in the setting of nausea and vomiting likely due to uncontrolled DM-1 and gastroparesis, and brachial vein DVT.She was a started on TPN through PICC line and transferred to [facility] for cortrak and TF initiation.   Hospital course noteworthy of systolic CHF exacerbation (on IV diuretics), AI (p.o. Cortef), ABLA, hematemesis in the setting of grade C esophagitis on EGD, hematuria (seems to have resolved), moderate right hydronephrosis (improved), Enterococcus faecalis UTI (completed treatment), acute urinary retention (resolved)...  Patient underwent cesarean section on 9/27.   Subjective: Seen and examined earlier this morning.  No major events overnight of this morning.  A little sore at surgical site.  No other complaints.  She denies chest pain, dyspnea, orthopnea, GI or UTI symptoms.  Objective: Vitals:   02/18/21 2340 02/19/21 0400 02/19/21 0749 02/19/21 1132  BP: (!) 142/77 (!) 148/73 (!) 159/81 (!) 143/76  Pulse: 86 89 99 99  Resp: 16 16 17 16   Temp: 97.9 F (36.6 C) 98.2 F (36.8 C) 98.6 F (37 C) 98.3 F (36.8 C)  TempSrc: Oral Oral Oral Oral  SpO2: 99% 99% 99% 99%  Weight:      Height:        Intake/Output Summary (Last 24 hours) at 02/19/2021 1519 Last data filed at 02/19/2021 0945 Gross per 24 hour  Intake 508.08 ml  Output 601 ml  Net -92.92 ml   Filed Weights   01/24/21 0603 01/26/21 0530 02/08/21 1157  Weight: 81.7 kg 80.6 kg 76.1 kg    Examination  GENERAL: No apparent distress.   Nontoxic. HEENT: MMM.  Vision and hearing grossly intact.  NECK: Supple.  No apparent JVD.  RESP: 99% on RA.  No IWOB.  Fair aeration bilaterally. CVS:  RRR. Heart sounds normal.  ABD/GI/GU: BS+. Abd soft and a little tender. MSK/EXT:  Moves extremities. No apparent deformity.  Trace BLE edema. SKIN: no apparent skin lesion or wound NEURO: Awake and alert. Oriented appropriately.  No apparent focal neuro deficit. PSYCH: Calm. Normal affect.   Procedures:  8/24-EGD with grade C esophagitis. 9/27-cesarean section  Microbiology summarized: 7/30-wet prep negative. 7/30-urine culture with insignificant growth. 8/15-urine culture with pansensitive Enterococcus faecalis 8/22-C. difficile negative 9/6 & 7-urine culture with multiple species 9/9-urine culture negative  Assessment & Plan: Volume overload/acute systolic CHF with improved EF-improved.  Denies cardiopulmonary symptoms.  I and O incomplete but about 900 cc over the last 24 hours.  Net -22 L.  Renal function improving. -Hold diuretics today -Closely monitor fluid status, renal functions and electrolytes -Cardiology signed off.  Intractable nausea/vomiting-could be due to possible gastroparesis, AI, and esophagitis.  Resolved. -As needed Reglan  Adrenal insufficiency: low cortisol level with minimal to no response to ACTH suggests primary AI.  ACTH level low but drawn after she started stress dose steroid.  -Weaning of stress dose steroid perioperatively. -Outpatient follow-up with endocrinology after discharge  ABLA due to surgical blood loss-about 999 cc charted.  Transfused 2 units with appropriate response. Recent Labs    02/10/21  1610 02/11/21 0420 02/12/21 0527 02/13/21 0523 02/14/21 0430 02/15/21 0500 02/16/21 0319 02/17/21 0500 02/18/21 0803 02/19/21 0530  HGB 8.1* 8.3* 7.9* 8.2* 8.2* 8.6* 8.2* 7.9* 6.5* 9.6*  -Continue monitoring  Hematemesis could be due to grade C esophagitis as noted on EGD on 8/24.   Resolved. -Continue Protonix and Carafate   Gross hematuria, unclear etiology:  Renal US with hydronephrosis.  Hematuria resolved.  H&H stable.  -Consider repeat renal US to see if hydronephrosis has resolved -Monitor H&H  Acute urinary tension/bilateral hydronephrosis-moderate right and mild left.  Urinary tension resolved. -As above.  Controlled DM-1 with possible gastroparesis: A1c 5.8% on 7/1. Recent Labs  Lab 02/17/21 0435 02/17/21 1027 02/18/21 0504 02/18/21 0859 02/19/21 0618  GLUCAP 113* 75 118* 120* 127*  -Continue insulin drip while on stress dose steroid, and transition to subcu insulin  -Continue Reglan for possible gastroparesis  AKI/azotemia: Likely from diuretics.  Improved. Recent Labs    02/10/21 0433 02/11/21 1112 02/12/21 0527 02/13/21 0523 02/14/21 0656 02/15/21 0500 02/16/21 0319 02/17/21 0642 02/18/21 0803 02/19/21 0530  BUN 27* 32* 32* 36* 32* 32* 37* 36* 33* 30*  CREATININE 1.30* 1.48* 1.39* 1.36* 1.32* 1.60* 1.70* 1.49* 1.22* 1.18*  -Monitor urine output and renal function  Elevated liver enzymes: Total bili within normal.  Stable. Recent Labs  Lab 02/13/21 0523 02/14/21 0656 02/15/21 0500 02/16/21 0319 02/17/21 0642 02/18/21 0803 02/19/21 0530  AST 43*  --  45*  --   --   --   --   ALT 53*  --  59*  --   --   --   --   ALKPHOS 77  --  78  --   --   --   --   BILITOT 0.6  --  1.0  --   --   --   --   PROT 5.1*  --  5.2*  --   --   --   --   ALBUMIN 2.5*   < > 2.5* 2.5* 2.5* 2.2* 2.3*   < > = values in this interval not displayed.    Essential hypertension: Relatively stable. -Continue Pradaxa and hydralazine  Chronic insomnia -On Unisom 12.5 mg twice daily   Acute thrombocytopenia: Resolved.   Right brachial vein DVT: Dx 12/07/20: seems to be recannulating based on repeat U/S.  -Heparin to be hold preoperatively   Hypokalemia/hypomagnesemia: Resolved. -Monitor   Third trimester intrauterine pregnancy: s/p successful  C-section   Enterococcus faecalis UTI-completed antibiotic course on 8/22.   Limited venous access:  - PICC reinserted 8/30.   Epistaxis/vaginal bleeding: Seems to have resolved.  Leukocytosis/bandemia: Likely demargination.  Inadequate oral intake Body mass index is 31.71 kg/m. Nutrition Problem: Inadequate oral intake Etiology: nausea, vomiting Signs/Symptoms: NPO status Interventions: TPN, Tube feeding   DVT prophylaxis:  SCD's Start: 02/17/21 1953 Place TED hose Start: 01/17/21 1655 Patient is on heparin drip for DVT.  Code Status: Full code Family Communication: Patient and/or RN.  U no family member at bedside.    Level of care: Antepartum Status is: Inpatient   Dispo: Per primary   Sch Meds:  Scheduled Meds:  sodium chloride   Intravenous Once   acetaminophen  1,000 mg Oral Q6H   Chlorhexidine Gluconate Cloth  6 each Topical Daily   cholecalciferol  1,000 Units Oral Daily   enoxaparin (LOVENOX) injection  60 mg Subcutaneous Q12H   famotidine  20 mg Oral BID   feeding supplement (GLUCERNA SHAKE)  237 mL  Oral BID BM   hydrALAZINE  75 mg Oral Q8H   [START ON 02/20/2021] hydrocortisone  25 mg Oral Q24H   And   [START ON 02/20/2021] hydrocortisone  15 mg Oral Q24H   hydrocortisone sod succinate (SOLU-CORTEF) inj  50 mg Intravenous Q12H   measles, mumps & rubella vaccine  0.5 mL Subcutaneous Once   metoCLOPramide  5 mg Oral TID AC & HS   NIFEdipine  60 mg Oral Daily   pantoprazole  40 mg Oral Daily   potassium chloride  40 mEq Oral Daily   prenatal multivitamin  1 tablet Oral Q1200   scopolamine  1 patch Transdermal Once   simethicone  80 mg Oral TID PC   sodium chloride flush  10-40 mL Intracatheter Q12H   Tdap  0.5 mL Intramuscular Once   Continuous Infusions:  insulin 4 Units/hr (02/19/21 1427)   PRN Meds:.coconut oil, witch hazel-glycerin **AND** dibucaine, diphenhydrAMINE, menthol-cetylpyridinium, oxyCODONE, oxyCODONE-acetaminophen, simethicone,  sodium chloride, zolpidem  Antimicrobials: Anti-infectives (From admission, onward)    Start     Dose/Rate Route Frequency Ordered Stop   01/30/21 1315  amoxicillin (AMOXIL) capsule 500 mg  Status:  Discontinued        500 mg Oral 2 times daily 01/30/21 1218 02/01/21 1426   01/30/21 1200  ampicillin (OMNIPEN) 2 g in sodium chloride 0.9 % 100 mL IVPB  Status:  Discontinued        2 g 300 mL/hr over 20 Minutes Intravenous Every 6 hours 01/30/21 1105 01/30/21 1218   01/06/21 1630  amoxicillin (AMOXIL) 250 MG/5ML suspension 500 mg        500 mg Per Tube Every 8 hours 01/06/21 1534 01/13/21 0602   01/06/21 1615  amoxicillin (AMOXIL) chewable tablet 500 mg  Status:  Discontinued        500 mg Oral Every 8 hours 01/06/21 1528 01/06/21 1532        I have personally reviewed the following labs and images: CBC: Recent Labs  Lab 02/15/21 0500 02/16/21 0319 02/17/21 0500 02/18/21 0803 02/19/21 0530  WBC 6.4 6.4 7.4 11.4* 13.7*  HGB 8.6* 8.2* 7.9* 6.5* 9.6*  HCT 26.0* 25.3* 24.3* 19.7* 28.5*  MCV 94.5 96.6 96.4 94.7 87.7  PLT 180 180 161 133* 154   BMP &GFR Recent Labs  Lab 02/15/21 0500 02/16/21 0319 02/17/21 0500 02/17/21 0642 02/18/21 0803 02/19/21 0530  NA 136 137  --  135 138 136  K 4.0 4.1  --  4.3 4.2 4.3  CL 99 101  --  102 105 104  CO2 30 29  --  27 27 25   GLUCOSE 143* 126*  --  261* 124* 120*  BUN 32* 37*  --  36* 33* 30*  CREATININE 1.60* 1.70*  --  1.49* 1.22* 1.18*  CALCIUM 8.5* 8.6*  --  8.4* 8.1* 8.4*  MG 2.1 2.1 2.3  --  2.0 2.4  PHOS 3.4 3.1  --  3.1 5.2* 5.0*   Estimated Creatinine Clearance: 65.6 mL/min (A) (by C-G formula based on SCr of 1.18 mg/dL (H)). Liver & Pancreas: Recent Labs  Lab 02/13/21 0523 02/14/21 0656 02/15/21 0500 02/16/21 0319 02/17/21 0642 02/18/21 0803 02/19/21 0530  AST 43*  --  45*  --   --   --   --   ALT 53*  --  59*  --   --   --   --   ALKPHOS 77  --  78  --   --   --   --  BILITOT 0.6  --  1.0  --   --   --   --    PROT 5.1*  --  5.2*  --   --   --   --   ALBUMIN 2.5*   < > 2.5* 2.5* 2.5* 2.2* 2.3*   < > = values in this interval not displayed.   No results for input(s): LIPASE, AMYLASE in the last 168 hours. No results for input(s): AMMONIA in the last 168 hours. Diabetic: No results for input(s): HGBA1C in the last 72 hours. Recent Labs  Lab 02/17/21 0435 02/17/21 1027 02/18/21 0504 02/18/21 0859 02/19/21 0618  GLUCAP 113* 75 118* 120* 127*   Cardiac Enzymes: No results for input(s): CKTOTAL, CKMB, CKMBINDEX, TROPONINI in the last 168 hours. No results for input(s): PROBNP in the last 8760 hours. Coagulation Profile: No results for input(s): INR, PROTIME in the last 168 hours. Thyroid Function Tests: No results for input(s): TSH, T4TOTAL, FREET4, T3FREE, THYROIDAB in the last 72 hours. Lipid Profile: No results for input(s): CHOL, HDL, LDLCALC, TRIG, CHOLHDL, LDLDIRECT in the last 72 hours. Anemia Panel: No results for input(s): VITAMINB12, FOLATE, FERRITIN, TIBC, IRON, RETICCTPCT in the last 72 hours. Urine analysis:    Component Value Date/Time   COLORURINE AMBER (A) 01/10/2021 0131   APPEARANCEUR CLOUDY (A) 01/10/2021 0131   APPEARANCEUR Clear 09/18/2020 1153   LABSPEC 1.017 01/10/2021 0131   LABSPEC 1.026 03/01/2013 1844   PHURINE 5.0 01/10/2021 0131   GLUCOSEU NEGATIVE 01/10/2021 0131   GLUCOSEU >=500 03/01/2013 1844   HGBUR NEGATIVE 01/10/2021 0131   BILIRUBINUR NEGATIVE 01/10/2021 0131   BILIRUBINUR neg 10/23/2020 0953   BILIRUBINUR Negative 09/18/2020 1153   BILIRUBINUR Negative 03/01/2013 1844   KETONESUR NEGATIVE 01/10/2021 0131   PROTEINUR NEGATIVE 01/10/2021 0131   UROBILINOGEN 0.2 10/23/2020 0953   NITRITE NEGATIVE 01/10/2021 0131   LEUKOCYTESUR SMALL (A) 01/10/2021 0131   LEUKOCYTESUR Negative 03/01/2013 1844   Sepsis Labs: Invalid input(s): PROCALCITONIN, LACTICIDVEN  Microbiology: Recent Results (from the past 240 hour(s))  Resp Panel by RT-PCR (Flu  A&B, Covid) Nasopharyngeal Swab     Status: None   Collection Time: 02/17/21  6:27 AM   Specimen: Nasopharyngeal Swab; Nasopharyngeal(NP) swabs in vial transport medium  Result Value Ref Range Status   SARS Coronavirus 2 by RT PCR NEGATIVE NEGATIVE Final    Comment: (NOTE) SARS-CoV-2 target nucleic acids are NOT DETECTED.  The SARS-CoV-2 RNA is generally detectable in upper respiratory specimens during the acute phase of infection. The lowest concentration of SARS-CoV-2 viral copies this assay can detect is 138 copies/mL. A negative result does not preclude SARS-Cov-2 infection and should not be used as the sole basis for treatment or other patient management decisions. A negative result may occur with  improper specimen collection/handling, submission of specimen other than nasopharyngeal swab, presence of viral mutation(s) within the areas targeted by this assay, and inadequate number of viral copies(<138 copies/mL). A negative result must be combined with clinical observations, patient history, and epidemiological information. The expected result is Negative.  Fact Sheet for Patients:  BloggerCourse.com  Fact Sheet for Healthcare Providers:  SeriousBroker.it  This test is no t yet approved or cleared by the [place] FDA and  has been authorized for detection and/or diagnosis of SARS-CoV-2 by FDA under an Emergency Use Authorization (EUA). This EUA will remain  in effect (meaning this test can be used) for the duration of the COVID-19 declaration under Section 564(b)(1) of the Act, 21 U.S.C.section  360bbb-3(b)(1), unless the authorization is terminated  or revoked sooner.       Influenza A by PCR NEGATIVE NEGATIVE Final   Influenza B by PCR NEGATIVE NEGATIVE Final    Comment: (NOTE) The Xpert Xpress SARS-CoV-2/FLU/RSV plus assay is intended as an aid in the diagnosis of influenza from Nasopharyngeal swab specimens  and should not be used as a sole basis for treatment. Nasal washings and aspirates are unacceptable for Xpert Xpress SARS-CoV-2/FLU/RSV testing.  Fact Sheet for Patients: BloggerCourse.com  Fact Sheet for Healthcare Providers: SeriousBroker.it  This test is not yet approved or cleared by the [place] FDA and has been authorized for detection and/or diagnosis of SARS-CoV-2 by FDA under an Emergency Use Authorization (EUA). This EUA will remain in effect (meaning this test can be used) for the duration of the COVID-19 declaration under Section 564(b)(1) of the Act, 21 U.S.C. section 360bbb-3(b)(1), unless the authorization is terminated or revoked.  Performed at [facility], [address] [place], [place] [address]     Radiology Studies: No results found.   Diahann Guajardo T. [facility]  If 7PM-7AM, please contact night-coverage [email or web address] 02/19/2021, 3:19 PM

## 2021-02-20 LAB — CBC
HCT: 25.8 % — ABNORMAL LOW (ref 36.0–46.0)
Hemoglobin: 8.5 g/dL — ABNORMAL LOW (ref 12.0–15.0)
MCH: 29.9 pg (ref 26.0–34.0)
MCHC: 32.9 g/dL (ref 30.0–36.0)
MCV: 90.8 fL (ref 80.0–100.0)
Platelets: 159 10*3/uL (ref 150–400)
RBC: 2.84 MIL/uL — ABNORMAL LOW (ref 3.87–5.11)
RDW: 18.4 % — ABNORMAL HIGH (ref 11.5–15.5)
WBC: 11.7 10*3/uL — ABNORMAL HIGH (ref 4.0–10.5)
nRBC: 0 % (ref 0.0–0.2)

## 2021-02-20 LAB — RENAL FUNCTION PANEL
Albumin: 2.2 g/dL — ABNORMAL LOW (ref 3.5–5.0)
Anion gap: 7 (ref 5–15)
BUN: 35 mg/dL — ABNORMAL HIGH (ref 6–20)
CO2: 25 mmol/L (ref 22–32)
Calcium: 8 mg/dL — ABNORMAL LOW (ref 8.9–10.3)
Chloride: 105 mmol/L (ref 98–111)
Creatinine, Ser: 1.36 mg/dL — ABNORMAL HIGH (ref 0.44–1.00)
GFR, Estimated: 54 mL/min — ABNORMAL LOW (ref >60)
Glucose, Bld: 112 mg/dL — ABNORMAL HIGH (ref 70–99)
Phosphorus: 4.5 mg/dL (ref 2.5–4.6)
Potassium: 4.7 mmol/L (ref 3.5–5.1)
Sodium: 137 mmol/L (ref 135–145)

## 2021-02-20 LAB — MAGNESIUM: Magnesium: 2.2 mg/dL (ref 1.7–2.4)

## 2021-02-20 LAB — HEMOGLOBIN AND HEMATOCRIT, BLOOD
HCT: 24.4 % — ABNORMAL LOW (ref 36.0–46.0)
Hemoglobin: 8.2 g/dL — ABNORMAL LOW (ref 12.0–15.0)

## 2021-02-20 LAB — PROTIME-INR
INR: 0.9 (ref 0.8–1.2)
Prothrombin Time: 12.6 seconds (ref 11.4–15.2)

## 2021-02-20 LAB — APTT: aPTT: 28 seconds (ref 24–36)

## 2021-02-20 LAB — GLUCOSE, CAPILLARY: Glucose-Capillary: 180 mg/dL — ABNORMAL HIGH (ref 70–99)

## 2021-02-20 LAB — HEPARIN ANTI-XA: Heparin LMW: 0.35 IU/mL

## 2021-02-20 MED ORDER — INSULIN ASPART 100 UNIT/ML IJ SOLN
0.0000 [IU] | INTRAMUSCULAR | Status: DC
  Administered 2021-02-20: 1 [IU] via SUBCUTANEOUS

## 2021-02-20 MED ORDER — FUROSEMIDE 40 MG PO TABS
40.0000 mg | ORAL_TABLET | Freq: Every day | ORAL | Status: DC
  Administered 2021-02-20: 40 mg via ORAL
  Filled 2021-02-20: qty 1

## 2021-02-20 MED ORDER — INSULIN DETEMIR 100 UNIT/ML ~~LOC~~ SOLN
10.0000 [IU] | SUBCUTANEOUS | Status: DC
  Administered 2021-02-20: 10 [IU] via SUBCUTANEOUS
  Filled 2021-02-20: qty 0.1

## 2021-02-20 MED ORDER — PNEUMOCOCCAL VAC POLYVALENT 25 MCG/0.5ML IJ INJ
0.5000 mL | INJECTION | INTRAMUSCULAR | Status: AC
  Administered 2021-02-22: 0.5 mL via INTRAMUSCULAR
  Filled 2021-02-20: qty 0.5

## 2021-02-20 MED ORDER — INFLUENZA VAC SPLIT QUAD 0.5 ML IM SUSY
0.5000 mL | PREFILLED_SYRINGE | INTRAMUSCULAR | Status: AC
  Administered 2021-02-21: 0.5 mL via INTRAMUSCULAR
  Filled 2021-02-20: qty 0.5

## 2021-02-20 MED ORDER — INSULIN NPH (HUMAN) (ISOPHANE) 100 UNIT/ML ~~LOC~~ SUSP
5.0000 [IU] | Freq: Two times a day (BID) | SUBCUTANEOUS | Status: DC
  Administered 2021-02-20: 5 [IU] via SUBCUTANEOUS
  Filled 2021-02-20 (×2): qty 10

## 2021-02-20 MED ORDER — INSULIN ASPART 100 UNIT/ML IJ SOLN
0.0000 [IU] | INTRAMUSCULAR | Status: DC
Start: 2021-02-20 — End: 2021-02-21
  Administered 2021-02-20: 2 [IU] via SUBCUTANEOUS
  Administered 2021-02-20: 5 [IU] via SUBCUTANEOUS

## 2021-02-20 MED ORDER — INSULIN ASPART 100 UNIT/ML IJ SOLN
2.0000 [IU] | Freq: Three times a day (TID) | INTRAMUSCULAR | Status: DC
  Administered 2021-02-20 – 2021-02-22 (×5): 2 [IU] via SUBCUTANEOUS

## 2021-02-20 NOTE — Progress Notes (Signed)
Subjective: Postpartum Day 3: Cesarean Delivery Patient reports incisional pain.    Objective: Vital signs in last 24 hours: Temp:  [97.9 F (36.6 C)-98.8 F (37.1 C)] 98.3 F (36.8 C) (09/30 0736) Pulse Rate:  [88-104] 99 (09/30 0736) Resp:  [15-18] 18 (09/30 0736) BP: (132-157)/(60-77) 133/71 (09/30 0736) SpO2:  [95 %-99 %] 99 % (09/30 0736)  Physical Exam:  General: alert, cooperative, and no distress Lochia: appropriate Uterine Fundus: firm Incision: healing well DVT Evaluation: No evidence of DVT seen on physical exam.  Recent Labs    02/19/21 0530 02/20/21 0324  HGB 9.6* 8.5*  HCT 28.5* 25.8*    Assessment/Plan: Status post Cesarean section. Doing well postoperatively.  Management of insulin per hospitalist and DM coordinator.  Scheryl Darter 02/20/2021, 10:22 AM

## 2021-02-20 NOTE — Progress Notes (Signed)
PROGRESS NOTE  Ashley Camacho FTD:[id number] DOB: Jan 09, 1991   PCP: Hildred Laser, MD  Patient is from: Home.  DOA: 12/16/2020 LOS: 66  Chief complaints:  No chief complaint on file.    Brief Narrative / Interim history: 30 y.o. G2P0101 with a history of T1DM, chronic HFrEF and recent hospitalization at [facility] on 6/30 for N/V and rhinovirus pneumonia requiring transfer to [facility].  She was discharged home from [facility] on 7/20 only to return on 7/22 with starvation ketoacidosis in the setting of nausea and vomiting likely due to uncontrolled DM-1 and gastroparesis, and brachial vein DVT.She was a started on TPN through PICC line and transferred to [facility] for cortrak and TF initiation.   Hospital course noteworthy of systolic CHF exacerbation (on IV diuretics), AI (p.o. Cortef), ABLA, hematemesis in the setting of grade C esophagitis on EGD, hematuria (seems to have resolved), moderate right hydronephrosis (improved), Enterococcus faecalis UTI (completed treatment), acute urinary retention (resolved)...  Patient underwent cesarean section on 9/27.   Subjective: Seen and examined earlier this morning.  No major events overnight of this morning.  Reports some pain from surgery.  No other complaints.  She denies chest pain, dyspnea, GI or UTI symptoms.  Objective: Vitals:   02/20/21 0408 02/20/21 0736 02/20/21 1138 02/20/21 1352  BP: 140/61 133/71 (!) 115/59 122/65  Pulse: 88 99 97 97  Resp: 18 18 16    Temp: 98.8 F (37.1 C) 98.3 F (36.8 C) 98.3 F (36.8 C)   TempSrc: Oral Oral Oral   SpO2: 97% 99% 99%   Weight:      Height:        Intake/Output Summary (Last 24 hours) at 02/20/2021 1520 Last data filed at 02/20/2021 1333 Gross per 24 hour  Intake 50 ml  Output 1051 ml  Net -1001 ml   Filed Weights   01/24/21 0603 01/26/21 0530 02/08/21 1157  Weight: 81.7 kg 80.6 kg 76.1 kg    Examination  GENERAL: No apparent distress.  Nontoxic. HEENT: MMM.  Vision and hearing grossly intact.   NECK: Supple.  No apparent JVD.  RESP: 99% on RA.  No IWOB.  Fair aeration bilaterally. CVS:  RRR. Heart sounds normal.  ABD/GI/GU: BS+. Abd soft.  Mild tenderness with palpation. MSK/EXT:  Moves extremities. No apparent deformity.  2+ pedal edema SKIN: no apparent skin lesion or wound NEURO: Awake and alert. Oriented appropriately.  No apparent focal neuro deficit. PSYCH: Calm. Normal affect.   Procedures:  8/24-EGD with grade C esophagitis. 9/27-cesarean section  Microbiology summarized: 7/30-wet prep negative. 7/30-urine culture with insignificant growth. 8/15-urine culture with pansensitive Enterococcus faecalis 8/22-C. difficile negative 9/6 & 7-urine culture with multiple species 9/9-urine culture negative  Assessment & Plan: Volume overload/acute systolic CHF with improved EF-improved.  Denies cardiopulmonary symptoms.  About 750 cc UOP/24 hours off diuretics.  Net -22 L.  Creatinine slightly up.  Seems to have 2+ pedal edema today.  No cardiopulmonary symptoms -Start p.o. Lasix 40 mg daily and adjust as appropriate -Closely monitor fluid status, renal functions and electrolytes -Cardiology signed off.  Intractable nausea/vomiting-could be due to possible gastroparesis, AI, and esophagitis.  Resolved. -Reglan as needed  Adrenal insufficiency: low cortisol level with minimal to no response to ACTH suggests primary AI.  ACTH level low but drawn after she started stress dose steroid.  -Weaning of stress dose steroid perioperatively. -Outpatient follow-up with endocrinology after discharge  ABLA/surgical blood loss: Transfused a total of 3 units.  Hgb dropped about 1 g.  Dilutional? Recent Labs    02/11/21 0420 02/12/21 0527 02/13/21 0523 02/14/21 0430 02/15/21 0500 02/16/21 0319 02/17/21 0500 02/18/21 0803 02/19/21 0530 02/20/21 0324  HGB 8.3* 7.9* 8.2* 8.2* 8.6* 8.2* 7.9* 6.5* 9.6* 8.5*  -Diuretics as above -Recheck CBC in the morning -Check anemia panel in  the morning  Hematemesis could be due to grade C esophagitis as noted on EGD on 8/24.  Resolved. -Continue Protonix and Carafate   Gross hematuria, unclear etiology:  Renal US with hydronephrosis.  Hematuria resolved.  H&H stable.  -Consider repeat renal US to see if hydronephrosis has resolved -Monitor H&H  Acute urinary tension/bilateral hydronephrosis-moderate right and mild left.  Urinary tension resolved. -As above.  Controlled DM-1 with possible gastroparesis: A1c 5.8% on 7/1. -Transitioned off insulin drip to Levemir 10 units, SSI-very sensitive and NovoLog 2 units 3 times daily -CBG monitoring every 4 hours given her insulin sensitivity-CBG moving from 78>>>142 so far -Continue Reglan for possible gastroparesis  AKI/azotemia: Cr slightly up today Recent Labs    02/11/21 1112 02/12/21 0527 02/13/21 0523 02/14/21 0656 02/15/21 0500 02/16/21 0319 02/17/21 0642 02/18/21 0803 02/19/21 0530 02/20/21 0324  BUN 32* 32* 36* 32* 32* 37* 36* 33* 30* 35*  CREATININE 1.48* 1.39* 1.36* 1.32* 1.60* 1.70* 1.49* 1.22* 1.18* 1.36*  -Monitor urine output and renal function  Elevated liver enzymes: Total bili within normal.  Stable. Recent Labs  Lab 02/15/21 0500 02/16/21 0319 02/17/21 1610 02/18/21 0803 02/19/21 0530 02/20/21 0324  AST 45*  --   --   --   --   --   ALT 59*  --   --   --   --   --   ALKPHOS 78  --   --   --   --   --   BILITOT 1.0  --   --   --   --   --   PROT 5.2*  --   --   --   --   --   ALBUMIN 2.5* 2.5* 2.5* 2.2* 2.3* 2.2*  -Recheck LFT in the morning  Essential hypertension: Relatively stable. -Continue Pradaxa XL 60 mg daily and hydralazine 75 mg 3 times daily -Also starting p.o. Lasix 40 mg daily  Chronic insomnia -On Unisom 12.5 mg twice daily   Acute thrombocytopenia: Resolved.   Right brachial vein DVT: Dx 12/07/20: seems to be recannulating based on repeat U/S.  Lovenox held this morning due to bleeding from injection site.  Coag labs  within normal. -Resume subcu Lovenox for anticoagulation   Hypokalemia/hypomagnesemia: Resolved.  K4.7.  Mg 2.2. -Hold K-Dur today   Third trimester intrauterine pregnancy: s/p successful C-section   Enterococcus faecalis UTI-completed antibiotic course on 8/22.   Limited venous access:  - PICC reinserted 8/30.   Epistaxis/vaginal bleeding: Seems to have resolved.  Leukocytosis/bandemia: Likely demargination.  Improved  Inadequate oral intake Body mass index is 31.71 kg/m. Nutrition Problem: Inadequate oral intake Etiology: nausea, vomiting Signs/Symptoms: NPO status Interventions: TPN, Tube feeding   DVT prophylaxis:  SCD's Start: 02/17/21 1953 Place TED hose Start: 01/17/21 1655 Patient is on heparin drip for DVT.  Code Status: Full code Family Communication: Patient and/or RN.  U no family member at bedside.    Level of care: Antepartum Status is: Inpatient   Dispo: Per primary   Sch Meds:  Scheduled Meds:  sodium chloride   Intravenous Once   acetaminophen  1,000 mg Oral Q6H   Chlorhexidine Gluconate Cloth  6 each Topical Daily  cholecalciferol  1,000 Units Oral Daily   enoxaparin (LOVENOX) injection  60 mg Subcutaneous Q12H   famotidine  20 mg Oral BID   feeding supplement (GLUCERNA SHAKE)  237 mL Oral BID BM   furosemide  40 mg Oral Daily   hydrALAZINE  75 mg Oral Q8H   hydrocortisone  25 mg Oral Q24H   And   hydrocortisone  15 mg Oral Q24H   [START ON 02/21/2021] influenza vac split quadrivalent PF  0.5 mL Intramuscular Tomorrow-1000   insulin aspart  0-6 Units Subcutaneous Q4H   insulin aspart  2 Units Subcutaneous TID WC   insulin detemir  10 Units Subcutaneous Q24H   measles, mumps & rubella vaccine  0.5 mL Subcutaneous Once   metoCLOPramide  5 mg Oral TID AC & HS   NIFEdipine  60 mg Oral Daily   pantoprazole  40 mg Oral Daily   [START ON 02/21/2021] pneumococcal 23 valent vaccine  0.5 mL Intramuscular Tomorrow-1000   prenatal multivitamin  1  tablet Oral Q1200   scopolamine  1 patch Transdermal Once   simethicone  80 mg Oral TID PC   sodium chloride flush  10-40 mL Intracatheter Q12H   Tdap  0.5 mL Intramuscular Once   Continuous Infusions:  dextrose Stopped (02/20/21 1230)   insulin Stopped (02/20/21 1230)   PRN Meds:.coconut oil, witch hazel-glycerin **AND** dibucaine, diphenhydrAMINE, menthol-cetylpyridinium, oxyCODONE, oxyCODONE-acetaminophen, simethicone, sodium chloride, zolpidem  Antimicrobials: Anti-infectives (From admission, onward)    Start     Dose/Rate Route Frequency Ordered Stop   01/30/21 1315  amoxicillin (AMOXIL) capsule 500 mg  Status:  Discontinued        500 mg Oral 2 times daily 01/30/21 1218 02/01/21 1426   01/30/21 1200  ampicillin (OMNIPEN) 2 g in sodium chloride 0.9 % 100 mL IVPB  Status:  Discontinued        2 g 300 mL/hr over 20 Minutes Intravenous Every 6 hours 01/30/21 1105 01/30/21 1218   01/06/21 1630  amoxicillin (AMOXIL) 250 MG/5ML suspension 500 mg        500 mg Per Tube Every 8 hours 01/06/21 1534 01/13/21 0602   01/06/21 1615  amoxicillin (AMOXIL) chewable tablet 500 mg  Status:  Discontinued        500 mg Oral Every 8 hours 01/06/21 1528 01/06/21 1532        I have personally reviewed the following labs and images: CBC: Recent Labs  Lab 02/16/21 0319 02/17/21 0500 02/18/21 0803 02/19/21 0530 02/20/21 0324  WBC 6.4 7.4 11.4* 13.7* 11.7*  HGB 8.2* 7.9* 6.5* 9.6* 8.5*  HCT 25.3* 24.3* 19.7* 28.5* 25.8*  MCV 96.6 96.4 94.7 87.7 90.8  PLT 180 161 133* 154 159   BMP &GFR Recent Labs  Lab 02/16/21 0319 02/17/21 0500 02/17/21 0642 02/18/21 0803 02/19/21 0530 02/20/21 0324  NA 137  --  135 138 136 137  K 4.1  --  4.3 4.2 4.3 4.7  CL 101  --  102 105 104 105  CO2 29  --  27 27 25 25   GLUCOSE 126*  --  261* 124* 120* 112*  BUN 37*  --  36* 33* 30* 35*  CREATININE 1.70*  --  1.49* 1.22* 1.18* 1.36*  CALCIUM 8.6*  --  8.4* 8.1* 8.4* 8.0*  MG 2.1 2.3  --  2.0 2.4 2.2   PHOS 3.1  --  3.1 5.2* 5.0* 4.5   Estimated Creatinine Clearance: 56.9 mL/min (A) (by C-G formula based on SCr  of 1.36 mg/dL (H)). Liver & Pancreas: Recent Labs  Lab 02/15/21 0500 02/16/21 0319 02/17/21 5093 02/18/21 0803 02/19/21 0530 02/20/21 0324  AST 45*  --   --   --   --   --   ALT 59*  --   --   --   --   --   ALKPHOS 78  --   --   --   --   --   BILITOT 1.0  --   --   --   --   --   PROT 5.2*  --   --   --   --   --   ALBUMIN 2.5* 2.5* 2.5* 2.2* 2.3* 2.2*   No results for input(s): LIPASE, AMYLASE in the last 168 hours. No results for input(s): AMMONIA in the last 168 hours. Diabetic: No results for input(s): HGBA1C in the last 72 hours. Recent Labs  Lab 02/17/21 0435 02/17/21 1027 02/18/21 0504 02/18/21 0859 02/19/21 0618  GLUCAP 113* 75 118* 120* 127*   Cardiac Enzymes: No results for input(s): CKTOTAL, CKMB, CKMBINDEX, TROPONINI in the last 168 hours. No results for input(s): PROBNP in the last 8760 hours. Coagulation Profile: Recent Labs  Lab 02/20/21 1007  INR 0.9   Thyroid Function Tests: No results for input(s): TSH, T4TOTAL, FREET4, T3FREE, THYROIDAB in the last 72 hours. Lipid Profile: No results for input(s): CHOL, HDL, LDLCALC, TRIG, CHOLHDL, LDLDIRECT in the last 72 hours. Anemia Panel: No results for input(s): VITAMINB12, FOLATE, FERRITIN, TIBC, IRON, RETICCTPCT in the last 72 hours. Urine analysis:    Component Value Date/Time   COLORURINE AMBER (A) 01/10/2021 0131   APPEARANCEUR CLOUDY (A) 01/10/2021 0131   APPEARANCEUR Clear 09/18/2020 1153   LABSPEC 1.017 01/10/2021 0131   LABSPEC 1.026 03/01/2013 1844   PHURINE 5.0 01/10/2021 0131   GLUCOSEU NEGATIVE 01/10/2021 0131   GLUCOSEU >=500 03/01/2013 1844   HGBUR NEGATIVE 01/10/2021 0131   BILIRUBINUR NEGATIVE 01/10/2021 0131   BILIRUBINUR neg 10/23/2020 0953   BILIRUBINUR Negative 09/18/2020 1153   BILIRUBINUR Negative 03/01/2013 1844   KETONESUR NEGATIVE 01/10/2021 0131    PROTEINUR NEGATIVE 01/10/2021 0131   UROBILINOGEN 0.2 10/23/2020 0953   NITRITE NEGATIVE 01/10/2021 0131   LEUKOCYTESUR SMALL (A) 01/10/2021 0131   LEUKOCYTESUR Negative 03/01/2013 1844   Sepsis Labs: Invalid input(s): PROCALCITONIN, LACTICIDVEN  Microbiology: Recent Results (from the past 240 hour(s))  Resp Panel by RT-PCR (Flu A&B, Covid) Nasopharyngeal Swab     Status: None   Collection Time: 02/17/21  6:27 AM   Specimen: Nasopharyngeal Swab; Nasopharyngeal(NP) swabs in vial transport medium  Result Value Ref Range Status   SARS Coronavirus 2 by RT PCR NEGATIVE NEGATIVE Final    Comment: (NOTE) SARS-CoV-2 target nucleic acids are NOT DETECTED.  The SARS-CoV-2 RNA is generally detectable in upper respiratory specimens during the acute phase of infection. The lowest concentration of SARS-CoV-2 viral copies this assay can detect is 138 copies/mL. A negative result does not preclude SARS-Cov-2 infection and should not be used as the sole basis for treatment or other patient management decisions. A negative result may occur with  improper specimen collection/handling, submission of specimen other than nasopharyngeal swab, presence of viral mutation(s) within the areas targeted by this assay, and inadequate number of viral copies(<138 copies/mL). A negative result must be combined with clinical observations, patient history, and epidemiological information. The expected result is Negative.  Fact Sheet for Patients:  BloggerCourse.com  Fact Sheet for Healthcare Providers:  SeriousBroker.it  This  test is no t yet approved or cleared by the [place] and  has been authorized for detection and/or diagnosis of SARS-CoV-2 by FDA under an Emergency Use Authorization (EUA). This EUA will remain  in effect (meaning this test can be used) for the duration of the COVID-19 declaration under Section 564(b)(1) of the Act,  21 U.S.C.section 360bbb-3(b)(1), unless the authorization is terminated  or revoked sooner.       Influenza A by PCR NEGATIVE NEGATIVE Final   Influenza B by PCR NEGATIVE NEGATIVE Final    Comment: (NOTE) The Xpert Xpress SARS-CoV-2/FLU/RSV plus assay is intended as an aid in the diagnosis of influenza from Nasopharyngeal swab specimens and should not be used as a sole basis for treatment. Nasal washings and aspirates are unacceptable for Xpert Xpress SARS-CoV-2/FLU/RSV testing.  Fact Sheet for Patients: BloggerCourse.com  Fact Sheet for Healthcare Providers: SeriousBroker.it  This test is not yet approved or cleared by the [place] FDA and has been authorized for detection and/or diagnosis of SARS-CoV-2 by FDA under an Emergency Use Authorization (EUA). This EUA will remain in effect (meaning this test can be used) for the duration of the COVID-19 declaration under Section 564(b)(1) of the Act, 21 U.S.C. section 360bbb-3(b)(1), unless the authorization is terminated or revoked.  Performed at [facility], [address], [place], [place] [address]     Radiology Studies: No results found.   Saahas Hidrogo T. Ruthanna Macchia [facility]  If 7PM-7AM, please contact night-coverage [email or web address] 02/20/2021, 3:20 PM

## 2021-02-20 NOTE — Progress Notes (Signed)
ANTICOAGULATION CONSULT NOTE - Follow Up Consult  Enoxaparin Indication:  right brachial VTE  No Known Allergies  Patient Measurements: Height: 5\' 1"  (154.9 cm) Weight: 76.1 kg (167 lb 12.8 oz) IBW/kg (Calculated) : 47.8  Vital Signs: Temp: 98.3 F (36.8 C) (09/30 1138) Temp Source: Oral (09/30 1138) BP: 122/65 (09/30 1352) Pulse Rate: 97 (09/30 1352)  Labs: Recent Labs    02/18/21 0803 02/19/21 0530 02/20/21 0324 02/20/21 1007  HGB 6.5* 9.6* 8.5*  --   HCT 19.7* 28.5* 25.8*  --   PLT 133* 154 159  --   APTT  --   --   --  28  LABPROT  --   --   --  12.6  INR  --   --   --  0.9  HEPRLOWMOCWT  --   --   --  0.35  CREATININE 1.22* 1.18* 1.36*  --     Estimated Creatinine Clearance: 56.9 mL/min (A) (by C-G formula based on SCr of 1.36 mg/dL (H)).   Medications:  Scheduled:   sodium chloride   Intravenous Once   acetaminophen  1,000 mg Oral Q6H   Chlorhexidine Gluconate Cloth  6 each Topical Daily   cholecalciferol  1,000 Units Oral Daily   enoxaparin (LOVENOX) injection  60 mg Subcutaneous Q12H   famotidine  20 mg Oral BID   feeding supplement (GLUCERNA SHAKE)  237 mL Oral BID BM   furosemide  40 mg Oral Daily   hydrALAZINE  75 mg Oral Q8H   hydrocortisone  25 mg Oral Q24H   And   hydrocortisone  15 mg Oral Q24H   [START ON 02/21/2021] influenza vac split quadrivalent PF  0.5 mL Intramuscular Tomorrow-1000   insulin aspart  0-6 Units Subcutaneous Q4H   insulin aspart  2 Units Subcutaneous TID WC   insulin detemir  10 Units Subcutaneous Q24H   measles, mumps & rubella vaccine  0.5 mL Subcutaneous Once   metoCLOPramide  5 mg Oral TID AC & HS   NIFEdipine  60 mg Oral Daily   pantoprazole  40 mg Oral Daily   [START ON 02/21/2021] pneumococcal 23 valent vaccine  0.5 mL Intramuscular Tomorrow-1000   prenatal multivitamin  1 tablet Oral Q1200   scopolamine  1 patch Transdermal Once   simethicone  80 mg Oral TID PC   sodium chloride flush  10-40 mL Intracatheter  Q12H   Tdap  0.5 mL Intramuscular Once    Assessment: 30 yo female POD#3 s/p Cesarean delivery. Patient reporting continuous oozing from enoxaparin injection site that is now resolved. AM dose this morning held. Given history of significant bleeding in the past and need to switch to heparin. Obtained antiXa on 9/30 @ 10:07 approximately 17 hours after last enoxaparin dose (9/29 @ 1735). AntiXa trough resulted at 0.35 which is at goal of < 0.5 units/ml.   Goal of Therapy:  Anti-Xa level 0.6-1 units/ml 4hrs after LMWH dose given or trough < 0.5 units/ml. Monitor platelets by anticoagulation protocol: Yes   Plan:  Continue Lovenox 60mg  q12h. Continue to monitor for s/sx of bleeding.  10-01-1978 Calysta Craigo 02/20/2021,3:03 PM

## 2021-02-20 NOTE — Progress Notes (Signed)
Pt complains of new onset right-sided sharp shooting pain near incision. RN assessed abdomen and noted tenderness on palpation of right lower quadrant. Patient firm/taught to palpation. Dr. Debroah Loop, MD notified at 1245pm.

## 2021-02-20 NOTE — Progress Notes (Signed)
Physical Therapy Treatment Patient Details Name: Ashley Camacho MRN: [id number] DOB: [birth year] Today's Date: 02/20/2021   History of Present Illness 30 y.o. 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 Medical history significant of [redacted]w[redacted]d on presentation.  Pt s/p C-section and bil salpingectomy on 9/06/30/20 at 34 weeks. Pt with hx of Type 1 diabetic, Cardiomyopathy, chronic systolic heart failure, DVT in bilateral arm. H/o recent hospitalizations d/t hyperemesis and sepsis secondary to pneumonia. .    PT Comments    Pt making good progress.  Session focused on educating on transfer techniques for pain control, as well as, increasing gait distance and educating on safe activity/progression of activity at home.  Pt ambulated 200' x 2 with seated rest break and VSS.  Continue plan of care.     Recommendations for follow up therapy are one component of a multi-disciplinary discharge planning process, led by the attending physician.  Recommendations may be updated based on patient status, additional functional criteria and insurance authorization.  Follow Up Recommendations  No PT follow up     Equipment Recommendations  None recommended by PT    Recommendations for Other Services       Precautions / Restrictions Precautions Precautions: Other (comment) Precaution Comments: c-section     Mobility  Bed Mobility Overal bed mobility: Needs Assistance Bed Mobility: Sit to Supine       Sit to supine: Min assist;HOB elevated   General bed mobility comments: Incr time and effort due to incisional pain and min A for legs    Transfers Overall transfer level: Needs assistance   Transfers: Sit to/from Stand Sit to Stand: Supervision         General transfer comment: Increased time; performed x 2  Ambulation/Gait Ambulation/Gait assistance: Supervision Gait Distance  (Feet): 200 Feet (200'x2) Assistive device: Pushed wheelchair Gait Pattern/deviations: Step-through pattern;Decreased stride length Gait velocity: decreased   General Gait Details: Supervision for safety; ambulated 200' x 2 with seated rest break; taking some steps in room without UE support   Stairs             Wheelchair Mobility    Modified Rankin (Stroke Patients Only)       Balance Overall balance assessment: No apparent balance deficits (not formally assessed)                                          Cognition Arousal/Alertness: Awake/alert Behavior During Therapy: WFL for tasks assessed/performed Overall Cognitive Status: Within Functional Limits for tasks assessed                                        Exercises      General Comments General comments (skin integrity, edema, etc.): Educated on gradual increase in activity.  Pt reports some worry about activity when returning home b/c still needs to get baby's room ready and they are moving their stuff from upstairs bedroom downstairs.  Educated on taking it easy, not lifting/straining/stretching, and having assist as needed with moving and getting things ready.  Discussed rest breaks as needed.  Also, discussed comfortable sleeping positions - recliner or wedge/pillows for bed      Pertinent Vitals/Pain Pain Assessment: Faces Faces Pain Scale: Hurts little more Pain  Location: abdominal incision with transfers Pain Descriptors / Indicators: Operative site guarding;Grimacing Pain Intervention(s): Limited activity within patient's tolerance;Monitored during session;Premedicated before session    Home Living                      Prior Function            PT Goals (current goals can now be found in the care plan section) Progress towards PT goals: Progressing toward goals    Frequency    Min 2X/week      PT Plan Current plan remains appropriate     Co-evaluation              AM-PAC PT "6 Clicks" Mobility   Outcome Measure  Help needed turning from your back to your side while in a flat bed without using bedrails?: None Help needed moving from lying on your back to sitting on the side of a flat bed without using bedrails?: A Little Help needed moving to and from a bed to a chair (including a wheelchair)?: A Little Help needed standing up from a chair using your arms (e.g., wheelchair or bedside chair)?: A Little Help needed to walk in hospital room?: A Little Help needed climbing 3-5 steps with a railing? : A Little 6 Click Score: 19    End of Session Equipment Utilized During Treatment: Gait belt Activity Tolerance: Patient tolerated treatment well Patient left: in bed;with call bell/phone within reach Nurse Communication: Mobility status PT Visit Diagnosis: Other abnormalities of gait and mobility (R26.89);Muscle weakness (generalized) (M62.81)     Time: 2800-3491 PT Time Calculation (min) (ACUTE ONLY): 19 min  Charges:  $Therapeutic Activity: 8-22 mins                     Anise Salvo, PT Acute Rehab Services Pager [phone] [facility] [phone]    Rayetta Humphrey 02/20/2021, 4:54 PM

## 2021-02-20 NOTE — Progress Notes (Signed)
Inpatient Diabetes Program Recommendations  AACE/ADA: New Consensus Statement on Inpatient Glycemic Control (2015)  Target Ranges:  Prepandial:   less than 140 mg/dL      Peak postprandial:   less than 180 mg/dL (1-2 hours)      Critically ill patients:  140 - 180 mg/dL   Lab Results  Component Value Date   GLUCAP 127 (H) 02/19/2021   HGBA1C 5.8 (H) 11/21/2020    Review of Glycemic Control Results for [name], Ashley Camacho (MRN [id number]) as of 02/20/2021 07:27  Ref. Range 02/18/2021 05:04 02/18/2021 08:59 02/19/2021 06:18  Glucose-Capillary Latest Ref Range: 70 - 99 mg/dL 109 (H) 323 (H) 557 (H)   Diabetes history: DM 1 Outpatient Diabetes medications: Omnipod with Novolog insulin  Most recent insulin pump rates from [facility]: 0.05 units/hr= 1.2 units in 24 hours, 1 units drops blood sugar 100 mg/dL, 1 units per 30 grams of CHO   In DM coordinator note from 11/20/2020: "Current pump settings are: 12-6am  0.15 units/hour 6am-12am 0.25 units/hour Total 5.4 units/24 hours  CF 80 (1 unit drops glucose 80 mg/dl) I:C 3:22 (1 unit covers 20 grams of carbs) Target BG 100 mg/dl"   Current orders for Inpatient glycemic control:  IV insulin Solucortef 25 mg QAM, 15 mg QPM   When ready to transition, consider: -Levemir 10 units two hours prior to discontinuation of IV insulin. Anticipate further adjustment on 10/1. -Novolog 0-6 Q4H - Novolog 2 units TID (assuming patient is consuming >50% of meals)  Thanks, Lujean Rave, MSN, RNC-OB Diabetes Coordinator [phone] (8a-5p)

## 2021-02-21 ENCOUNTER — Inpatient Hospital Stay (HOSPITAL_COMMUNITY): Payer: Medicaid Other

## 2021-02-21 LAB — HEMOGLOBIN A1C
Hgb A1c MFr Bld: 5.4 % (ref 4.8–5.6)
Mean Plasma Glucose: 108.28 mg/dL

## 2021-02-21 LAB — RENAL FUNCTION PANEL
Albumin: 2.1 g/dL — ABNORMAL LOW (ref 3.5–5.0)
Anion gap: 4 — ABNORMAL LOW (ref 5–15)
BUN: 45 mg/dL — ABNORMAL HIGH (ref 6–20)
CO2: 26 mmol/L (ref 22–32)
Calcium: 7.9 mg/dL — ABNORMAL LOW (ref 8.9–10.3)
Chloride: 108 mmol/L (ref 98–111)
Creatinine, Ser: 1.38 mg/dL — ABNORMAL HIGH (ref 0.44–1.00)
GFR, Estimated: 53 mL/min — ABNORMAL LOW (ref >60)
Glucose, Bld: 98 mg/dL (ref 70–99)
Phosphorus: 4.5 mg/dL (ref 2.5–4.6)
Potassium: 4.3 mmol/L (ref 3.5–5.1)
Sodium: 138 mmol/L (ref 135–145)

## 2021-02-21 LAB — MAGNESIUM: Magnesium: 2.2 mg/dL (ref 1.7–2.4)

## 2021-02-21 LAB — CBC
HCT: 19.3 % — ABNORMAL LOW (ref 36.0–46.0)
HCT: 19.6 % — ABNORMAL LOW (ref 36.0–46.0)
Hemoglobin: 6.2 g/dL — CL (ref 12.0–15.0)
Hemoglobin: 6.3 g/dL — CL (ref 12.0–15.0)
MCH: 30 pg (ref 26.0–34.0)
MCH: 29.9 pg (ref 26.0–34.0)
MCHC: 32.1 g/dL (ref 30.0–36.0)
MCHC: 32.1 g/dL (ref 30.0–36.0)
MCV: 93.2 fL (ref 80.0–100.0)
MCV: 92.9 fL (ref 80.0–100.0)
Platelets: 165 10*3/uL (ref 150–400)
Platelets: 185 10*3/uL (ref 150–400)
RBC: 2.07 MIL/uL — ABNORMAL LOW (ref 3.87–5.11)
RBC: 2.11 MIL/uL — ABNORMAL LOW (ref 3.87–5.11)
RDW: 18.1 % — ABNORMAL HIGH (ref 11.5–15.5)
RDW: 18.2 % — ABNORMAL HIGH (ref 11.5–15.5)
WBC: 11.5 10*3/uL — ABNORMAL HIGH (ref 4.0–10.5)
WBC: 15.5 10*3/uL — ABNORMAL HIGH (ref 4.0–10.5)
nRBC: 0 % (ref 0.0–0.2)
nRBC: 0 % (ref 0.0–0.2)

## 2021-02-21 LAB — FERRITIN: Ferritin: 347 ng/mL — ABNORMAL HIGH (ref 11–307)

## 2021-02-21 LAB — GLUCOSE, CAPILLARY
Glucose-Capillary: 95 mg/dL (ref 70–99)
Glucose-Capillary: 77 mg/dL (ref 70–99)
Glucose-Capillary: 74 mg/dL (ref 70–99)
Glucose-Capillary: 67 mg/dL — ABNORMAL LOW (ref 70–99)
Glucose-Capillary: 160 mg/dL — ABNORMAL HIGH (ref 70–99)

## 2021-02-21 LAB — VITAMIN B12: Vitamin B-12: 351 pg/mL (ref 180–914)

## 2021-02-21 LAB — IRON AND TIBC
Iron: 30 ug/dL (ref 28–170)
Saturation Ratios: 12 % (ref 10.4–31.8)
TIBC: 245 ug/dL — ABNORMAL LOW (ref 250–450)
UIBC: 215 ug/dL

## 2021-02-21 LAB — RETICULOCYTES
Immature Retic Fract: 25.5 % — ABNORMAL HIGH (ref 2.3–15.9)
RBC.: 2.09 MIL/uL — ABNORMAL LOW (ref 3.87–5.11)
Retic Count, Absolute: 79.4 10*3/uL (ref 19.0–186.0)
Retic Ct Pct: 3.8 % — ABNORMAL HIGH (ref 0.4–3.1)

## 2021-02-21 LAB — PREPARE RBC (CROSSMATCH)

## 2021-02-21 LAB — BRAIN NATRIURETIC PEPTIDE: B Natriuretic Peptide: 95.9 pg/mL (ref 0.0–100.0)

## 2021-02-21 LAB — FOLATE: Folate: 29.5 ng/mL (ref >5.9)

## 2021-02-21 MED ORDER — IOHEXOL 300 MG/ML  SOLN
65.0000 mL | Freq: Once | INTRAMUSCULAR | Status: AC | PRN
  Administered 2021-02-21: 65 mL via INTRAVENOUS

## 2021-02-21 MED ORDER — INSULIN NPH (HUMAN) (ISOPHANE) 100 UNIT/ML ~~LOC~~ SUSP
5.0000 [IU] | Freq: Two times a day (BID) | SUBCUTANEOUS | Status: DC
  Administered 2021-02-22: 5 [IU] via SUBCUTANEOUS
  Filled 2021-02-21: qty 10

## 2021-02-21 MED ORDER — HYDROMORPHONE HCL 1 MG/ML IJ SOLN: 1.0000 mg | Freq: Once | INTRAMUSCULAR | Status: DC | PRN

## 2021-02-21 MED ORDER — HYDROMORPHONE HCL 1 MG/ML IJ SOLN
1.0000 mg | INTRAMUSCULAR | Status: DC | PRN
  Administered 2021-02-21 – 2021-03-03 (×18): 1 mg via INTRAVENOUS
  Filled 2021-02-21 (×18): qty 1

## 2021-02-21 MED ORDER — SODIUM CHLORIDE 0.9% IV SOLUTION: Freq: Once | INTRAVENOUS | Status: AC

## 2021-02-21 MED ORDER — HYDRALAZINE HCL 50 MG PO TABS
50.0000 mg | ORAL_TABLET | Freq: Three times a day (TID) | ORAL | Status: DC
  Filled 2021-02-21: qty 1

## 2021-02-21 MED ORDER — SODIUM CHLORIDE 0.9 % IV SOLN: Freq: Once | INTRAVENOUS | Status: AC

## 2021-02-21 MED ORDER — INSULIN ASPART 100 UNIT/ML IJ SOLN
0.0000 [IU] | Freq: Three times a day (TID) | INTRAMUSCULAR | Status: DC
  Administered 2021-02-22 (×2): 2 [IU] via SUBCUTANEOUS
  Administered 2021-02-22: 3 [IU] via SUBCUTANEOUS
  Administered 2021-02-23: 2 [IU] via SUBCUTANEOUS
  Administered 2021-02-24 (×3): 3 [IU] via SUBCUTANEOUS
  Administered 2021-02-25: 1 [IU] via SUBCUTANEOUS

## 2021-02-21 MED ORDER — HYDROMORPHONE HCL 1 MG/ML IJ SOLN
1.0000 mg | Freq: Once | INTRAMUSCULAR | Status: DC
Start: 2021-02-21 — End: 2021-02-21

## 2021-02-21 MED ORDER — DEXTROSE 50 % IV SOLN
INTRAVENOUS | Status: AC
  Administered 2021-02-21: 12.5 g via INTRAVENOUS
  Filled 2021-02-21: qty 50

## 2021-02-21 MED ORDER — DEXTROSE 50 % IV SOLN: 12.5000 g | INTRAVENOUS | Status: AC

## 2021-02-21 MED ORDER — HYDROMORPHONE HCL 1 MG/ML IJ SOLN
0.5000 mg | Freq: Once | INTRAMUSCULAR | Status: DC
  Filled 2021-02-21: qty 0.5

## 2021-02-21 MED ORDER — HYDROMORPHONE HCL 1 MG/ML IJ SOLN
0.5000 mg | Freq: Once | INTRAMUSCULAR | Status: AC
  Administered 2021-02-21: 0.5 mg via INTRAVENOUS

## 2021-02-21 MED ORDER — GABAPENTIN 600 MG PO TABS
300.0000 mg | ORAL_TABLET | Freq: Once | ORAL | Status: AC
  Administered 2021-02-21: 300 mg via ORAL
  Filled 2021-02-21: qty 0.5

## 2021-02-21 MED ORDER — HYDROMORPHONE HCL 1 MG/ML IJ SOLN: 1.0000 mg | Freq: Once | INTRAMUSCULAR | Status: DC

## 2021-02-21 MED ORDER — NIFEDIPINE ER OSMOTIC RELEASE 30 MG PO TB24: 30.0000 mg | ORAL_TABLET | Freq: Every day | ORAL | Status: DC

## 2021-02-21 NOTE — Progress Notes (Signed)
Both PICC line were occluded and hard to flush. Changed caps and dressing. Lined pulled out 1 cm previously. Talked patient's nurse that patient need CXR for placement. Will check CXR then possible tPa for ports . HS [employer]

## 2021-02-21 NOTE — Progress Notes (Signed)
Subjective: Postpartum Day 4: Cesarean Delivery Patient reports incisional pain but states today is worse. She denies bowel movement. She reports pain is 9/10. She felt her pain was previously better controlled on her PO medication.   Objective: Vital signs in last 24 hours: Temp:  [97.9 F (36.6 C)-98.3 F (36.8 C)] 98 F (36.7 C) (10/01 0744) Pulse Rate:  [88-97] 95 (10/01 0744) Resp:  [16-18] 18 (10/01 0744) BP: (111-129)/(58-65) 111/60 (10/01 0744) SpO2:  [97 %-99 %] 99 % (10/01 0744)  Physical Exam:  General: alert, cooperative, and no distress Lochia: appropriate Uterine Fundus: unable to palpate well due to distension and pain Abdomen: + bowel sounds, distended, + rebound Incision: healing well DVT Evaluation: No evidence of DVT seen on physical exam.  Recent Labs    02/20/21 1557 02/21/21 0321  HGB 8.2* 6.2*  HCT 24.4* 19.3*     Assessment/Plan: Status post Cesarean section.  - Given drop in hgb, her pain and distension, I recommend she have a CT.  - Spoke with radiologist, Dr. Mayford Knife about pros and cons for iv contrast vs no contrast. Ultimately, I think it would be best to have contrast. They will do reduced contrast protocol and we will hydrate prior to the CT - Will communicate this plan with her nurse on specialty - Dilaudid IV ordered temporarily at this time while awaiting CT results  - Management of insulin per hospitalist and DM coordinator along with other multiple medical issues  Milas Hock 02/21/2021, 8:31 AM

## 2021-02-21 NOTE — Lactation Note (Signed)
This note was copied from a baby's chart. Lactation Consultation Note LC to mother's inpatient room for f/u visit. Mother is acutely ill and has not pumped today. LC spoke briefly with mother and RN. Will re-visit when mother is stable. No charge.   Patient Name: Ashley Camacho Date: 02/21/2021   Age:30 days  Ashley Camacho 02/21/2021, 2:31 PM

## 2021-02-21 NOTE — Progress Notes (Signed)
PROGRESS NOTE  Ashley Camacho LYH:[id number] DOB: 1990/12/01   PCP: Hildred Laser, MD  Patient is from: Home.  DOA: 12/16/2020 LOS: 67  Chief complaints:  No chief complaint on file.    Brief Narrative / Interim history: 30 y.o. G2P0101 with a history of T1DM, chronic HFrEF and recent hospitalization at [facility] on 6/30 for N/V and rhinovirus pneumonia requiring transfer to [facility].  She was discharged home from [facility] on 7/20 only to return on 7/22 with starvation ketoacidosis in the setting of nausea and vomiting likely due to uncontrolled DM-1 and gastroparesis, and brachial vein DVT.She was a started on TPN through PICC line and transferred to [facility] for cortrak and TF initiation.   Hospital course noteworthy of systolic CHF exacerbation (on IV diuretics), AI (p.o. Cortef), ABLA, hematemesis in the setting of grade C esophagitis on EGD, hematuria (seems to have resolved), moderate right hydronephrosis (improved), Enterococcus faecalis UTI (completed treatment), acute urinary retention (resolved)...  Patient underwent cesarean section on 9/27.  Had postop blood loss requiring blood transfusions.  Subjective: Seen and examined earlier this morning.  Had severe 10/10 pain at incision site last night.  Pain improved to 5/10 this morning after IV pain medication.  She denies more than usual vaginal bleeding or abdominal distention.  She denies chest pain or dyspnea.  She reports dizziness and fatigue.  She also reports poor appetite that she attributes to pain.  CBG has been fluctuating up and down.   Objective: Vitals:   02/20/21 1918 02/20/21 2318 02/21/21 0341 02/21/21 0744  BP: 123/64 116/61 (!) 129/58 111/60  Pulse: 93 95 88 95  Resp: 18 18 18 18   Temp: 98.1 F (36.7 C) 98 F (36.7 C) 98.1 F (36.7 C) 98 F (36.7 C)  TempSrc: Oral Oral Oral Oral  SpO2: 98% 97% 98% 99%  Weight:      Height:        Intake/Output Summary (Last 24 hours) at 02/21/2021 1207 Last data filed at 02/21/2021  0708 Gross per 24 hour  Intake 933.37 ml  Output 700 ml  Net 233.37 ml   Filed Weights   01/24/21 0603 01/26/21 0530 02/08/21 1157  Weight: 81.7 kg 80.6 kg 76.1 kg    Examination  GENERAL: No apparent distress.  Nontoxic. HEENT: MMM.  Vision and hearing grossly intact.  NECK: Supple.  No apparent JVD.  RESP: 99% on RA.  No IWOB.  Fair aeration bilaterally. CVS:  RRR. Heart sounds normal.  ABD/GI/GU: BS+. Abd soft but tender.  Honeycomb dressing DCI. MSK/EXT:  Moves extremities. No apparent deformity.  1-2 pedal edema. SKIN: no apparent skin lesion or wound NEURO: Awake and alert. Oriented appropriately.  No apparent focal neuro deficit. PSYCH: Calm. Normal affect.   Procedures:  8/24-EGD with grade C esophagitis. 9/27-cesarean section  Microbiology summarized: 7/30-wet prep negative. 7/30-urine culture with insignificant growth. 8/15-urine culture with pansensitive Enterococcus faecalis 8/22-C. difficile negative 9/6 & 7-urine culture with multiple species 9/9-urine culture negative  Assessment & Plan: Volume overload/acute systolic CHF with improved EF-improved.  Denies cardiopulmonary symptoms.  About 1 L UOP/24 hours on p.o. Lasix.  Net -22 L.  Creatinine stable.  Has 1-2 pedal edema on exam.  No cardiopulmonary symptoms other than dizziness likely from anemia -Hold p.o. Lasix today since she is getting CT abdomen and pelvis with IV contrast -Agree with gentle hydration for CT contrast but cautiously. -Closely monitor fluid status, renal functions and electrolytes -Cardiology signed off.  Intractable nausea/vomiting-could be due to possible  gastroparesis, AI, and esophagitis.  Resolved. -Reglan as needed  Adrenal insufficiency: low cortisol level with minimal to no response to ACTH suggests primary AI.  ACTH level low but drawn after she started stress dose steroid.  -Off stress dose steroids to maintenance.  -Outpatient follow-up with endocrinology after  discharge  ABLA/surgical blood loss: 3 units after C/S with appropriate response.  Hgb dropped about 3 g again.  Given her new and severe abdominal pain, concern about internal bleeding from surgical site. Recent Labs    02/14/21 0430 02/15/21 0500 02/16/21 0319 02/17/21 0500 02/18/21 0803 02/19/21 0530 02/20/21 0324 02/20/21 1557 02/21/21 0321 02/21/21 1010  HGB 8.2* 8.6* 8.2* 7.9* 6.5* 9.6* 8.5* 8.2* 6.2* 6.3*  -Transfuse 1 unit -Agree with CT abdomen and pelvis with contrast -Monitor H&H -Will hold evening dose of Lovenox if H&H continues to drop or active bleed on CT abdomen and pelvis  Hematemesis could be due to grade C esophagitis as noted on EGD on 8/24.  Resolved. -Continue Protonix and Carafate   Gross hematuria, unclear etiology:  Renal US with hydronephrosis.  Hematuria resolved.  -Consider repeat renal US to see if hydronephrosis has resolved  Acute urinary tension/bilateral hydronephrosis-moderate right and mild left.  Urinary tension resolved. -As above.  Controlled DM-1 with with hypo and hyperglycemia: A1c 5.8% on 7/1.  Repeat A1c not reliable after blood transfusion.  She has labile blood glucose probably due to poor p.o. intake.  Recent Labs  Lab 02/21/21 0316 02/21/21 0514 02/21/21 0747 02/21/21 1006 02/21/21 1038  GLUCAP 95 77 74 67* 160*  -Change Levemir to NPH 5 units twice daily after meals. Will prevent sugar spikes with steroid -CBG monitoring and every 4 SSI-very sensitive -NovoLog 2 units 3 times daily with meals if she eats> 50% of her meals -Mealtime coverage and NPH to be called if CBG less than 100 or she doesn't finish 50% of her meals -Continue Reglan for possible gastroparesis  AKI/azotemia: Cr slightly up today Recent Labs    02/12/21 0527 02/13/21 0523 02/14/21 0656 02/15/21 0500 02/16/21 0319 02/17/21 0642 02/18/21 0803 02/19/21 0530 02/20/21 0324 02/21/21 0321  BUN 32* 36* 32* 32* 37* 36* 33* 30* 35* 45*  CREATININE  1.39* 1.36* 1.32* 1.60* 1.70* 1.49* 1.22* 1.18* 1.36* 1.38*  -Monitor urine output and renal function  Elevated liver enzymes: Total bili within normal.  Stable. Recent Labs  Lab 02/15/21 0500 02/16/21 0319 02/17/21 3267 02/18/21 0803 02/19/21 0530 02/20/21 0324 02/21/21 0321  AST 45*  --   --   --   --   --   --   ALT 59*  --   --   --   --   --   --   ALKPHOS 78  --   --   --   --   --   --   BILITOT 1.0  --   --   --   --   --   --   PROT 5.2*  --   --   --   --   --   --   ALBUMIN 2.5*   < > 2.5* 2.2* 2.3* 2.2* 2.1*   < > = values in this interval not displayed.  -Recheck LFT in the morning  Essential hypertension: Normotensive this morning. -Continue Pradaxa XL 60 mg daily and hydralazine 75 mg 3 times daily -Added holding parameters for SBP<110 or DBP<60. -Hold Lasix today  Chronic insomnia -On Unisom 12.5 mg twice daily   Acute  thrombocytopenia: Resolved.   Right brachial vein DVT: Dx 12/07/20: seems to be recannulating based on repeat U/S.  Lovenox held this morning due to bleeding from injection site.  Coag labs within normal. -Resume subcu Lovenox for anticoagulation   Hypokalemia/hypomagnesemia: Resolved.    Third trimester intrauterine pregnancy: s/p C-section delivery.   Enterococcus faecalis UTI-completed antibiotic course on 8/22.   Limited venous access:  -New PICC line on 02/21/2021   Epistaxis/vaginal bleeding: Seems to have resolved.  Leukocytosis/bandemia: Likely demargination.  Improved  Inadequate oral intake Body mass index is 31.71 kg/m. Nutrition Problem: Inadequate oral intake Etiology: nausea, vomiting Signs/Symptoms: NPO status Interventions: TPN, Tube feeding   DVT prophylaxis:  SCD's Start: 02/17/21 1953 Place TED hose Start: 01/17/21 1655 Patient is on heparin drip for DVT.  Code Status: Full code Family Communication: Patient and/or RN.  No family member at bedside.  Level of care: Postpartum Status is:  Inpatient   Dispo: Per primary   Sch Meds:  Scheduled Meds:  sodium chloride   Intravenous Once   sodium chloride   Intravenous Once   acetaminophen  1,000 mg Oral Q6H   Chlorhexidine Gluconate Cloth  6 each Topical Daily   cholecalciferol  1,000 Units Oral Daily   enoxaparin (LOVENOX) injection  60 mg Subcutaneous Q12H   famotidine  20 mg Oral BID   feeding supplement (GLUCERNA SHAKE)  237 mL Oral BID BM   furosemide  40 mg Oral Daily   hydrALAZINE  75 mg Oral Q8H   hydrocortisone  25 mg Oral Q24H   And   hydrocortisone  15 mg Oral Q24H   influenza vac split quadrivalent PF  0.5 mL Intramuscular Tomorrow-1000   insulin aspart  0-6 Units Subcutaneous TID WC   insulin aspart  2 Units Subcutaneous TID WC   insulin NPH Human  5 Units Subcutaneous BID   measles, mumps & rubella vaccine  0.5 mL Subcutaneous Once   metoCLOPramide  5 mg Oral TID AC & HS   NIFEdipine  60 mg Oral Daily   pantoprazole  40 mg Oral Daily   pneumococcal 23 valent vaccine  0.5 mL Intramuscular Tomorrow-1000   prenatal multivitamin  1 tablet Oral Q1200   simethicone  80 mg Oral TID PC   sodium chloride flush  10-40 mL Intracatheter Q12H   Tdap  0.5 mL Intramuscular Once   Continuous Infusions:  insulin Stopped (02/20/21 1230)   PRN Meds:.coconut oil, witch hazel-glycerin **AND** dibucaine, diphenhydrAMINE, HYDROmorphone (DILAUDID) injection, menthol-cetylpyridinium, oxyCODONE, simethicone, sodium chloride, zolpidem  Antimicrobials: Anti-infectives (From admission, onward)    Start     Dose/Rate Route Frequency Ordered Stop   01/30/21 1315  amoxicillin (AMOXIL) capsule 500 mg  Status:  Discontinued        500 mg Oral 2 times daily 01/30/21 1218 02/01/21 1426   01/30/21 1200  ampicillin (OMNIPEN) 2 g in sodium chloride 0.9 % 100 mL IVPB  Status:  Discontinued        2 g 300 mL/hr over 20 Minutes Intravenous Every 6 hours 01/30/21 1105 01/30/21 1218   01/06/21 1630  amoxicillin (AMOXIL) 250 MG/5ML  suspension 500 mg        500 mg Per Tube Every 8 hours 01/06/21 1534 01/13/21 0602   01/06/21 1615  amoxicillin (AMOXIL) chewable tablet 500 mg  Status:  Discontinued        500 mg Oral Every 8 hours 01/06/21 1528 01/06/21 1532        I have personally reviewed  the following labs and images: CBC: Recent Labs  Lab 02/18/21 0803 02/19/21 0530 02/20/21 0324 02/20/21 1557 02/21/21 0321 02/21/21 1010  WBC 11.4* 13.7* 11.7*  --  11.5* 15.5*  HGB 6.5* 9.6* 8.5* 8.2* 6.2* 6.3*  HCT 19.7* 28.5* 25.8* 24.4* 19.3* 19.6*  MCV 94.7 87.7 90.8  --  93.2 92.9  PLT 133* 154 159  --  165 185   BMP &GFR Recent Labs  Lab 02/17/21 0500 02/17/21 0642 02/18/21 0803 02/19/21 0530 02/20/21 0324 02/21/21 0321  NA  --  135 138 136 137 138  K  --  4.3 4.2 4.3 4.7 4.3  CL  --  102 105 104 105 108  CO2  --  27 27 25 25 26   GLUCOSE  --  261* 124* 120* 112* 98  BUN  --  36* 33* 30* 35* 45*  CREATININE  --  1.49* 1.22* 1.18* 1.36* 1.38*  CALCIUM  --  8.4* 8.1* 8.4* 8.0* 7.9*  MG 2.3  --  2.0 2.4 2.2 2.2  PHOS  --  3.1 5.2* 5.0* 4.5 4.5   Estimated Creatinine Clearance: 56.1 mL/min (A) (by C-G formula based on SCr of 1.38 mg/dL (H)). Liver & Pancreas: Recent Labs  Lab 02/15/21 0500 02/16/21 0319 02/17/21 02/19/21 02/18/21 0803 02/19/21 0530 02/20/21 0324 02/21/21 0321  AST 45*  --   --   --   --   --   --   ALT 59*  --   --   --   --   --   --   ALKPHOS 78  --   --   --   --   --   --   BILITOT 1.0  --   --   --   --   --   --   PROT 5.2*  --   --   --   --   --   --   ALBUMIN 2.5*   < > 2.5* 2.2* 2.3* 2.2* 2.1*   < > = values in this interval not displayed.   No results for input(s): LIPASE, AMYLASE in the last 168 hours. No results for input(s): AMMONIA in the last 168 hours. Diabetic: Recent Labs    02/21/21 0321  HGBA1C 5.4   Recent Labs  Lab 02/21/21 0316 02/21/21 0514 02/21/21 0747 02/21/21 1006 02/21/21 1038  GLUCAP 95 77 74 67* 160*   Cardiac Enzymes: No results  for input(s): CKTOTAL, CKMB, CKMBINDEX, TROPONINI in the last 168 hours. No results for input(s): PROBNP in the last 8760 hours. Coagulation Profile: Recent Labs  Lab 02/20/21 1007  INR 0.9   Thyroid Function Tests: No results for input(s): TSH, T4TOTAL, FREET4, T3FREE, THYROIDAB in the last 72 hours. Lipid Profile: No results for input(s): CHOL, HDL, LDLCALC, TRIG, CHOLHDL, LDLDIRECT in the last 72 hours. Anemia Panel: Recent Labs    02/21/21 0321  VITAMINB12 351  FOLATE 29.5  FERRITIN 347*  TIBC 245*  IRON 30  RETICCTPCT 3.8*   Urine analysis:    Component Value Date/Time   COLORURINE AMBER (A) 01/10/2021 0131   APPEARANCEUR CLOUDY (A) 01/10/2021 0131   APPEARANCEUR Clear 09/18/2020 1153   LABSPEC 1.017 01/10/2021 0131   LABSPEC 1.026 03/01/2013 1844   PHURINE 5.0 01/10/2021 0131   GLUCOSEU NEGATIVE 01/10/2021 0131   GLUCOSEU >=500 03/01/2013 1844   HGBUR NEGATIVE 01/10/2021 0131   BILIRUBINUR NEGATIVE 01/10/2021 0131   BILIRUBINUR neg 10/23/2020 0953   BILIRUBINUR Negative 09/18/2020 1153  BILIRUBINUR Negative 03/01/2013 1844   KETONESUR NEGATIVE 01/10/2021 0131   PROTEINUR NEGATIVE 01/10/2021 0131   UROBILINOGEN 0.2 10/23/2020 0953   NITRITE NEGATIVE 01/10/2021 0131   LEUKOCYTESUR SMALL (A) 01/10/2021 0131   LEUKOCYTESUR Negative 03/01/2013 1844   Sepsis Labs: Invalid input(s): PROCALCITONIN, LACTICIDVEN  Microbiology: Recent Results (from the past 240 hour(s))  Resp Panel by RT-PCR (Flu A&B, Covid) Nasopharyngeal Swab     Status: None   Collection Time: 02/17/21  6:27 AM   Specimen: Nasopharyngeal Swab; Nasopharyngeal(NP) swabs in vial transport medium  Result Value Ref Range Status   SARS Coronavirus 2 by RT PCR NEGATIVE NEGATIVE Final    Comment: (NOTE) SARS-CoV-2 target nucleic acids are NOT DETECTED.  The SARS-CoV-2 RNA is generally detectable in upper respiratory specimens during the acute phase of infection. The lowest concentration of  SARS-CoV-2 viral copies this assay can detect is 138 copies/mL. A negative result does not preclude SARS-Cov-2 infection and should not be used as the sole basis for treatment or other patient management decisions. A negative result may occur with  improper specimen collection/handling, submission of specimen other than nasopharyngeal swab, presence of viral mutation(s) within the areas targeted by this assay, and inadequate number of viral copies(<138 copies/mL). A negative result must be combined with clinical observations, patient history, and epidemiological information. The expected result is Negative.  Fact Sheet for Patients:  BloggerCourse.com  Fact Sheet for Healthcare Providers:  SeriousBroker.it  This test is no t yet approved or cleared by the [place] FDA and  has been authorized for detection and/or diagnosis of SARS-CoV-2 by FDA under an Emergency Use Authorization (EUA). This EUA will remain  in effect (meaning this test can be used) for the duration of the COVID-19 declaration under Section 564(b)(1) of the Act, 21 U.S.C.section 360bbb-3(b)(1), unless the authorization is terminated  or revoked sooner.       Influenza A by PCR NEGATIVE NEGATIVE Final   Influenza B by PCR NEGATIVE NEGATIVE Final    Comment: (NOTE) The Xpert Xpress SARS-CoV-2/FLU/RSV plus assay is intended as an aid in the diagnosis of influenza from Nasopharyngeal swab specimens and should not be used as a sole basis for treatment. Nasal washings and aspirates are unacceptable for Xpert Xpress SARS-CoV-2/FLU/RSV testing.  Fact Sheet for Patients: BloggerCourse.com  Fact Sheet for Healthcare Providers: SeriousBroker.it  This test is not yet approved or cleared by the [place] FDA and has been authorized for detection and/or diagnosis of SARS-CoV-2 by FDA under an Emergency Use  Authorization (EUA). This EUA will remain in effect (meaning this test can be used) for the duration of the COVID-19 declaration under Section 564(b)(1) of the Act, 21 U.S.C. section 360bbb-3(b)(1), unless the authorization is terminated or revoked.  Performed at [facility], [address], [place], [place] [address]     Radiology Studies: DG CHEST PORT 1 VIEW  Result Date: 02/21/2021 CLINICAL DATA:  PICC line placement EXAM: PORTABLE CHEST 1 VIEW COMPARISON:  Chest x-ray dated February 21, 2021 FINDINGS: Left arm PICC line repositioning with tip now over the expected area of the mid SVC. Clear lungs. No large pleural effusion or pneumothorax. Cardiac and mediastinal contours are unchanged and within normal limits for AP technique. IMPRESSION: Left arm PICC line tip is positioned over the expected area of the mid SVC. Electronically Signed   By: Allegra Lai M.D.   On: 02/21/2021 11:56   DG CHEST PORT 1 VIEW  Result Date: 02/21/2021 CLINICAL DATA:  PICC line complication. EXAM: PORTABLE CHEST 1 VIEW COMPARISON:  January 13, 2021 FINDINGS: The PICC line is in a different position than previously identified. Left PICC line previously terminating in the SVC. The distal tip is now likely in the right brachiocephalic vein with a kink in the distal end. The heart, hila, mediastinum, lungs, and pleura are otherwise unremarkable. IMPRESSION: The left PICC line is repositioned in the interval. The left PICC line now likely terminates in the right brachiocephalic vein with the distal tip flipped back on itself. Recommend repositioning. Electronically Signed   By: Gerome Sam III M.D.   On: 02/21/2021 08:42     Asher Torpey T. Braxdon Gappa [facility]  If 7PM-7AM, please contact night-coverage [email or web address] 02/21/2021, 12:07 PM

## 2021-02-21 NOTE — Progress Notes (Signed)
Inpatient Diabetes Program Recommendations  AACE/ADA: New Consensus Statement on Inpatient Glycemic Control (2015)  Target Ranges:  Prepandial:   less than 140 mg/dL      Peak postprandial:   less than 180 mg/dL (1-2 hours)      Critically ill patients:  140 - 180 mg/dL   Lab Results  Component Value Date   GLUCAP 74 02/21/2021   HGBA1C 5.4 02/21/2021    Review of Glycemic Control Results for [name], [name] (MRN [id number]) as of 02/21/2021 08:51  Ref. Range 02/19/2021 06:18 02/20/2021 23:15 02/21/2021 03:16 02/21/2021 05:14 02/21/2021 07:47  Glucose-Capillary Latest Ref Range: 70 - 99 mg/dL 937 (H) 902 (H) 95 77 74  Diabetes history: DM 1 Outpatient Diabetes medications: Omnipod with Novolog insulin  Most recent insulin pump rates from [facility]: 0.05 units/hr= 1.2 units in 24 hours, 1 units drops blood sugar 100 mg/dL, 1 units per 30 grams of CHO   In DM coordinator note from 11/20/2020: "Current pump settings are: 12-6am  0.15 units/hour 6am-12am 0.25 units/hour Total 5.4 units/24 hours  CF 80 (1 unit drops glucose 80 mg/dl) I:C 4:09 (1 unit covers 20 grams of carbs) Target BG 100 mg/dl"   Current orders for Inpatient glycemic control:  NPH 5 units am & hs, Novolog 2 units tid meal coverage, Novolog 0-9 corrections q 4 hrs.  Inpatient Diabetes Program Recommendations:  : -D/C NPH -Levemir 4 units q 12 hrs. -Change Novolog correction to 0-6 units tid + hs 0-5 units Secure chat sent to Dr. Alanda Slim.  Thank you, Ashley Camacho. [name], RN, MSN, CDE  Diabetes Coordinator Inpatient Glycemic Control Team Team Pager [phone] (8am-5pm) 02/21/2021 8:59 AM

## 2021-02-21 NOTE — Progress Notes (Signed)
CXR results with PICC now in the SVC as expected.  Secure chat sent to RN to notify to remove PIV in R Hand and change tubing and ok to utilize PICC line for IVF and meds.  Instructed as well, to leave red port for CT access for power injectable use.

## 2021-02-21 NOTE — Progress Notes (Signed)
CT results reviewed with pt. Pain a little better with pain medication adjustments. Currently receiving 1 unit of PRBC's Will recheck labs in AM. Will follow hematoma clinically for now. POC discussed with pt.

## 2021-02-21 NOTE — Progress Notes (Signed)
Pt c/o of incisional pain 10/10. Pt denies need to void and s/s of gas pain. Provider notified of increase in pain. Verbal order given for Dilaudid. Will continue to monitor.

## 2021-02-21 NOTE — Progress Notes (Signed)
CXR noted that PICC is malpositioned.  Retracted PICC to 3 cm exposed.  Flushing easily and great blood return present.  Repeat CXR ordered to check placement.

## 2021-02-22 DIAGNOSIS — N99840 Postprocedural hematoma of a genitourinary system organ or structure following a genitourinary system procedure: Secondary | ICD-10-CM

## 2021-02-22 DIAGNOSIS — Z98891 History of uterine scar from previous surgery: Secondary | ICD-10-CM

## 2021-02-22 LAB — CBC
HCT: 17.6 % — ABNORMAL LOW (ref 36.0–46.0)
HCT: 20.6 % — ABNORMAL LOW (ref 36.0–46.0)
Hemoglobin: 5.9 g/dL — CL (ref 12.0–15.0)
Hemoglobin: 7.1 g/dL — ABNORMAL LOW (ref 12.0–15.0)
MCH: 29.8 pg (ref 26.0–34.0)
MCH: 30.4 pg (ref 26.0–34.0)
MCHC: 33.5 g/dL (ref 30.0–36.0)
MCHC: 34.5 g/dL (ref 30.0–36.0)
MCV: 86.6 fL (ref 80.0–100.0)
MCV: 90.7 fL (ref 80.0–100.0)
Platelets: 158 10*3/uL (ref 150–400)
Platelets: 178 10*3/uL (ref 150–400)
RBC: 1.94 MIL/uL — ABNORMAL LOW (ref 3.87–5.11)
RBC: 2.38 MIL/uL — ABNORMAL LOW (ref 3.87–5.11)
RDW: 18.3 % — ABNORMAL HIGH (ref 11.5–15.5)
RDW: 20.6 % — ABNORMAL HIGH (ref 11.5–15.5)
WBC: 20.2 10*3/uL — ABNORMAL HIGH (ref 4.0–10.5)
WBC: 23.3 10*3/uL — ABNORMAL HIGH (ref 4.0–10.5)
nRBC: 0 % (ref 0.0–0.2)
nRBC: 0 % (ref 0.0–0.2)

## 2021-02-22 LAB — BPAM RBC
Blood Product Expiration Date: 202210022359
Blood Product Expiration Date: 202210062359
Blood Product Expiration Date: 202210262359
ISSUE DATE / TIME: 202210011233
ISSUE DATE / TIME: 202210011505
ISSUE DATE / TIME: 202210021217
Unit Type and Rh: 6200
Unit Type and Rh: 6200
Unit Type and Rh: 6200

## 2021-02-22 LAB — TYPE AND SCREEN
ABO/RH(D): A POS
Antibody Screen: NEGATIVE
Unit division: 0
Unit division: 0
Unit division: 0

## 2021-02-22 LAB — BASIC METABOLIC PANEL
Anion gap: 6 (ref 5–15)
BUN: 52 mg/dL — ABNORMAL HIGH (ref 6–20)
CO2: 24 mmol/L (ref 22–32)
Calcium: 7.7 mg/dL — ABNORMAL LOW (ref 8.9–10.3)
Chloride: 105 mmol/L (ref 98–111)
Creatinine, Ser: 1.66 mg/dL — ABNORMAL HIGH (ref 0.44–1.00)
GFR, Estimated: 43 mL/min — ABNORMAL LOW (ref >60)
Glucose, Bld: 180 mg/dL — ABNORMAL HIGH (ref 70–99)
Potassium: 4.9 mmol/L (ref 3.5–5.1)
Sodium: 135 mmol/L (ref 135–145)

## 2021-02-22 LAB — PREPARE RBC (CROSSMATCH)

## 2021-02-22 LAB — HEMOGLOBIN AND HEMATOCRIT, BLOOD
HCT: 18.8 % — ABNORMAL LOW (ref 36.0–46.0)
Hemoglobin: 6.2 g/dL — CL (ref 12.0–15.0)

## 2021-02-22 MED ORDER — INSULIN ASPART 100 UNIT/ML IJ SOLN
3.0000 [IU] | Freq: Three times a day (TID) | INTRAMUSCULAR | Status: DC
  Administered 2021-02-22 – 2021-02-23 (×2): 3 [IU] via SUBCUTANEOUS

## 2021-02-22 MED ORDER — METOCLOPRAMIDE HCL 10 MG PO TABS: 5.0000 mg | ORAL_TABLET | Freq: Three times a day (TID) | ORAL | Status: DC | PRN

## 2021-02-22 MED ORDER — INSULIN NPH (HUMAN) (ISOPHANE) 100 UNIT/ML ~~LOC~~ SUSP
6.0000 [IU] | Freq: Two times a day (BID) | SUBCUTANEOUS | Status: DC
  Administered 2021-02-22 – 2021-02-23 (×2): 6 [IU] via SUBCUTANEOUS
  Filled 2021-02-22: qty 10

## 2021-02-22 NOTE — Lactation Note (Signed)
This note was copied from a baby's chart. Lactation Consultation Note  Patient Name: Ashley Camacho [id number] Date: 02/22/2021 Reason for consult: Follow-up assessment;Late-preterm 34-36.6wks;NICU baby;Maternal endocrine disorder Age:30 days  Visited with mom of 61 66/50 weeks old (adjusted) NICU female, she voiced she hasn't pumped today either, RN Olegario Messier working with mom when [facility] entered the room.  Mom is at risk of low supply and engorgement due to infrequent pumping, will try to F/U in baby's room; she was getting ready to visit him. Continue current plan of care, encouraged mom to start pumping again every 3 hours.  Maternal Data   Mom hasn't pump due to pain/discomfort  Feeding Mother's Current Feeding Choice: Breast Milk and Donor Milk  Lactation Tools Discussed/Used Tools: Pump Breast pump type: Double-Electric Breast Pump Pump Education: Setup, frequency, and cleaning Reason for Pumping: LPI in NICU Pumping frequency: Mom has not pumped today, OB Specialty care RN Olegario Messier reported that mom has an hematoma on her abdomen and she's in pain Pumped volume: 0 mL  Discharge Pump: DEBP;Personal  Consult Status Consult Status: Follow-up Date: 02/22/21 Follow-up type: In-patient   Ashley Camacho 02/22/2021, 4:42 PM

## 2021-02-22 NOTE — Progress Notes (Signed)
POSTPARTUM PROGRESS NOTE  POD #5  Subjective:  Ashley Camacho is a 29 y.o. [id number] s/p repeat LTCS at [redacted]w[redacted]d.  Pt continues to have some moderate abdominal pain.  Again discussed presence of hematoma.  At time of evaluation pt was finishing her blood transfusion.  She is scheduled for h/h after completion of transfusion.   She is able to eat.   Denies nausea or vomiting. She has  passed flatus. Pain is moderately controlled.  Lochia is appropriate.  Objective: Blood pressure (!) 115/50, pulse (!) 102, temperature 99.3 F (37.4 C), temperature source Oral, resp. rate 17, height 5\' 1"  (1.549 m), weight 76.1 kg, SpO2 95 %, unknown if currently breastfeeding.  Physical Exam:  General: alert, cooperative and no distress Chest: no respiratory distress Heart:regular rate, distal pulses intact Abdomen: tender just above incision line.  Large firm mass detected in same area.  No discoloration or ecchymoses.   Uterine Fundus: firm, appropriately tender DVT Evaluation: No calf swelling or tenderness Extremities: moderate edema Skin: warm, dry; incision clean/dry/intact w/ honeycomb dressing in place  Recent Labs    02/22/21 0015 02/22/21 0204  HGB 5.9* 6.2*  HCT 17.6* 18.8*    Assessment/Plan: Ashley Camacho is a 30 y.o. G2P0202 s/p repeat LTCS now with bladder flap hematoma .  POD#5 -  Contraception: BTL Follow up on post transfusion h/h.  If appropriate rise or stable will continue to follow.   If continued drop in h/h will discuss with interventional radiology to see if potential offending vessel can be embolized without repeat surgical intervention.    LOS: 68 days   11-19-1993, Md Faculty Attending, [facility] 02/22/2021, 11:44 AM

## 2021-02-22 NOTE — Progress Notes (Signed)
Date and time results received: 02/22/21 0015 (use smartphrase ".now" to insert current time)  Test: Hemoglobin   Critical Value: 5.9  Name of Provider Notified: Dr. Alysia Penna    Orders Received? Or Actions Taken?: Orders Received - See Orders for details  Provider verbal order to retest pt. Retest taken at 0202 and hemoglobin resulted at 6.2. Provider made aware. New orders to transfuse 1 U PRBC and retake CBC in AM.

## 2021-02-22 NOTE — Progress Notes (Signed)
PROGRESS NOTE  Ashley Camacho TLX:[id number] DOB: 08-08-1990   PCP: Hildred Laser, MD  Patient is from: Home.  DOA: 12/16/2020 LOS: 68  Chief complaints:  No chief complaint on file.    Brief Narrative / Interim history: 30 y.o. G2P0101 with a history of T1DM, chronic HFrEF and recent hospitalization at [facility] on 6/30 for N/V and rhinovirus pneumonia requiring transfer to [facility].  She was discharged home from [facility] on 7/20 only to return on 7/22 with starvation ketoacidosis in the setting of nausea and vomiting likely due to uncontrolled DM-1 and gastroparesis, and brachial vein DVT.She was a started on TPN through PICC line and transferred to [facility] for cortrak and TF initiation.   Hospital course noteworthy of systolic CHF exacerbation (on IV diuretics), AI (p.o. Cortef), ABLA, hematemesis in the setting of grade C esophagitis on EGD, hematuria (seems to have resolved), moderate right hydronephrosis (improved), Enterococcus faecalis UTI (completed treatment), acute urinary retention (resolved)...  Patient underwent cesarean section on 9/27.  Had postop blood loss with large bladder flap hematoma measuring about 15 cm requiring blood transfusions.  Subjective: Seen and examined earlier this morning.  Hgb dropped to 5.9 overnight.  Also leukocytosis to 20.2.  Continues to endorse surgical site pain.  Describes the pain as sharp and constant.  Rates her pain 7/10.  Also reports feeling dizzy when she gets up.  Increased lower extremity edema as well.  She denies chest pain, dyspnea, orthopnea, nausea, vomiting or UTI symptoms.  About 825 cc UOP/24 hours.  Net +1 L / 24 hours.  Objective: Vitals:   02/21/21 2342 02/22/21 0300 02/22/21 0724 02/22/21 0805  BP: (!) 112/54 (!) 109/53 (!) 119/52 (!) 115/50  Pulse: (!) 110 98 (!) 103 (!) 105  Resp:  16 16 16   Temp: 98.2 F (36.8 C) 98.9 F (37.2 C) 99.8 F (37.7 C) 99.3 F (37.4 C)  TempSrc: Oral Oral Oral Oral  SpO2: 98% 95% 94% 97%  Weight:       Height:        Intake/Output Summary (Last 24 hours) at 02/22/2021 1127 Last data filed at 02/22/2021 1000 Gross per 24 hour  Intake 2141.23 ml  Output 975 ml  Net 1166.23 ml   Filed Weights   01/24/21 0603 01/26/21 0530 02/08/21 1157  Weight: 81.7 kg 80.6 kg 76.1 kg    Examination  GENERAL: No apparent distress.  Nontoxic. HEENT: MMM.  Vision and hearing grossly intact.  NECK: Supple.  No apparent JVD.  RESP: 97% on RA.  No IWOB.  Fair aeration bilaterally. CVS: HR in 100s.  Regular rhythm. Heart sounds normal.  ABD/GI/GU: BS+. Abd soft but tender.  Honeycomb dressing DCI. MSK/EXT:  Moves extremities. No apparent deformity.  2+ BLE edema. SKIN: no apparent skin lesion or wound NEURO: Awake and alert. Oriented appropriately.  No apparent focal neuro deficit. PSYCH: Calm. Normal affect.   Procedures:  8/24-EGD with grade C esophagitis. 9/27-cesarean section  Microbiology summarized: 7/30-wet prep negative. 7/30-urine culture with insignificant growth. 8/15-urine culture with pansensitive Enterococcus faecalis 8/22-C. difficile negative 9/6 & 7-urine culture with multiple species 9/9-urine culture negative  Assessment & Plan: Volume overload/acute systolic CHF with improved EF-BLE edema seems to be worse.  UOP 825 cc / 24 hours but net +1 L / 24 hours.  Had fluid bolus yesterday for contrast.  Cr trending up.  No shortness of breath, orthopnea or chest pain.  97% on RA.  Volume overload/edema could be from CHF, anemia, steroid and  poor nutrition. -Continue holding Lasix as creatinine is uptrending and blood pressure is soft -Closely monitor fluid status, renal functions and electrolytes -Cardiology signed off.  Intractable nausea/vomiting-could be due to possible gastroparesis, AI, and esophagitis.  Resolved. -Change Reglan to as needed  Adrenal insufficiency: low cortisol level with minimal to no response to ACTH suggests primary AI.  ACTH level low but drawn after  she started stress dose steroid.  -Back on maintenance oral Cortef. May need stress dose steroid with slow taper. -Outpatient follow-up with endocrinology after discharge  Epistaxis-resolved. Hematemesis could be due to grade C esophagitis as noted on EGD on 8/24.  Resolved. ABLA/surgical blood loss/hematoma: 3 units after C/S with appropriate response.  Hgb Hgb dropped further to 5.9 despite additional 1 unit.  CT shows 15 cm bladder flap hematoma with possible mass-effect on ureters.  Lovenox on hold.  Primary transfusing 1 unit Recent Labs    02/16/21 0319 02/17/21 0500 02/18/21 0803 02/19/21 0530 02/20/21 0324 02/20/21 1557 02/21/21 0321 02/21/21 1010 02/22/21 0015 02/22/21 0204  HGB 8.2* 7.9* 6.5* 9.6* 8.5* 8.2* 6.2* 6.3* 5.9* 6.2*  -Monitor H&H -Defer further care to primary team. -Continue p.o. Protonix for esophagitis  AKI/azotemia: Cr/BUN up despite holding Lasix.  Could be due to soft blood pressures.  She also had contrast with CT. CT shows moderate right and mild left hydronephrosis although these seem to be chronic Recent Labs    02/13/21 0523 02/14/21 0656 02/15/21 0500 02/16/21 0319 02/17/21 0642 02/18/21 0803 02/19/21 0530 02/20/21 0324 02/21/21 0321 02/22/21 0015  BUN 36* 32* 32* 37* 36* 33* 30* 35* 45* 52*  CREATININE 1.36* 1.32* 1.60* 1.70* 1.49* 1.22* 1.18* 1.36* 1.38* 1.66*  -Closely monitor renal function -Renal US and urology consult if renal function gets worse   Gross hematuria, unclear etiology:  Renal US with hydronephrosis.  Hematuria resolved.  Acute urinary tension/bilateral hydronephrosis-moderate right and mild left.  Urinary tension resolved. -As above.  Controlled DM-1 with with hypo and hyperglycemia: A1c 5.8% on 7/1.  Repeat A1c not reliable after blood transfusion.  She has labile blood glucose probably due to poor p.o. intake.  Recent Labs  Lab 02/21/21 0316 02/21/21 0514 02/21/21 0747 02/21/21 1006 02/21/21 1038  GLUCAP 95  77 74 67* 160*  -Continue NPH 5 units twice daily after meals. Will prevent sugar spikes with steroid -Continue CBG monitoring and every 4 SSI-very sensitive -Continue NovoLog 2 units 3 times daily with meals if she eats> 50% of her meals -Mealtime coverage and NPH to be held if CBG less than 100 or she doesn't finish 50% of her meals  Elevated liver enzymes: Total bili within normal.  Stable. -Recheck LFT in the morning  Essential hypertension: Normotensive this morning. -Discontinue Pradaxa and hydralazine  Chronic insomnia -On Unisom 12.5 mg twice daily   Acute thrombocytopenia: Resolved.   Right brachial vein DVT: Dx 12/07/20: seems to be recannulating based on repeat U/S.  Lovenox held this morning due to bleeding from injection site.  Coag labs within normal. -Holding Lovenox in the setting of hematoma   Hypokalemia/hypomagnesemia: Resolved.    Third trimester intrauterine pregnancy: s/p C-section delivery.   Enterococcus faecalis UTI-completed antibiotic course on 8/22.   Limited venous access:  -New PICC line on 02/21/2021  Leukocytosis/bandemia: Likely demargination.  Improved  Inadequate oral intake Body mass index is 31.71 kg/m. Nutrition Problem: Inadequate oral intake Etiology: nausea, vomiting Signs/Symptoms: NPO status Interventions: TPN, Tube feeding   DVT prophylaxis:  SCD's Start: 02/17/21 1953 Place  TED hose Start: 01/17/21 1655 Patient is on heparin drip for DVT.  Code Status: Full code Family Communication: Patient and/or RN.  No family member at bedside.  Level of care: Postpartum Status is: Inpatient   Dispo: Per primary   Sch Meds:  Scheduled Meds:  sodium chloride   Intravenous Once   acetaminophen  1,000 mg Oral Q6H   Chlorhexidine Gluconate Cloth  6 each Topical Daily   cholecalciferol  1,000 Units Oral Daily   famotidine  20 mg Oral BID   feeding supplement (GLUCERNA SHAKE)  237 mL Oral BID BM   hydrocortisone  25 mg Oral Q24H    And   hydrocortisone  15 mg Oral Q24H   insulin aspart  0-6 Units Subcutaneous TID WC   insulin aspart  2 Units Subcutaneous TID WC   insulin NPH Human  5 Units Subcutaneous BID PC   measles, mumps & rubella vaccine  0.5 mL Subcutaneous Once   metoCLOPramide  5 mg Oral TID AC & HS   pantoprazole  40 mg Oral Daily   pneumococcal 23 valent vaccine  0.5 mL Intramuscular Tomorrow-1000   prenatal multivitamin  1 tablet Oral Q1200   simethicone  80 mg Oral TID PC   sodium chloride flush  10-40 mL Intracatheter Q12H   Tdap  0.5 mL Intramuscular Once   Continuous Infusions:   PRN Meds:.coconut oil, witch hazel-glycerin **AND** dibucaine, diphenhydrAMINE, HYDROmorphone (DILAUDID) injection, menthol-cetylpyridinium, oxyCODONE, simethicone, sodium chloride, zolpidem  Antimicrobials: Anti-infectives (From admission, onward)    Start     Dose/Rate Route Frequency Ordered Stop   01/30/21 1315  amoxicillin (AMOXIL) capsule 500 mg  Status:  Discontinued        500 mg Oral 2 times daily 01/30/21 1218 02/01/21 1426   01/30/21 1200  ampicillin (OMNIPEN) 2 g in sodium chloride 0.9 % 100 mL IVPB  Status:  Discontinued        2 g 300 mL/hr over 20 Minutes Intravenous Every 6 hours 01/30/21 1105 01/30/21 1218   01/06/21 1630  amoxicillin (AMOXIL) 250 MG/5ML suspension 500 mg        500 mg Per Tube Every 8 hours 01/06/21 1534 01/13/21 0602   01/06/21 1615  amoxicillin (AMOXIL) chewable tablet 500 mg  Status:  Discontinued        500 mg Oral Every 8 hours 01/06/21 1528 01/06/21 1532        I have personally reviewed the following labs and images: CBC: Recent Labs  Lab 02/19/21 0530 02/20/21 0324 02/20/21 1557 02/21/21 0321 02/21/21 1010 02/22/21 0015 02/22/21 0204  WBC 13.7* 11.7*  --  11.5* 15.5* 20.2*  --   HGB 9.6* 8.5* 8.2* 6.2* 6.3* 5.9* 6.2*  HCT 28.5* 25.8* 24.4* 19.3* 19.6* 17.6* 18.8*  MCV 87.7 90.8  --  93.2 92.9 90.7  --   PLT 154 159  --  165 185 158  --    BMP &GFR Recent  Labs  Lab 02/17/21 0500 02/17/21 0642 02/18/21 0803 02/19/21 0530 02/20/21 0324 02/21/21 0321 02/22/21 0015  NA  --  135 138 136 137 138 135  K  --  4.3 4.2 4.3 4.7 4.3 4.9  CL  --  102 105 104 105 108 105  CO2  --  27 27 25 25 26 24   GLUCOSE  --  261* 124* 120* 112* 98 180*  BUN  --  36* 33* 30* 35* 45* 52*  CREATININE  --  1.49* 1.22* 1.18* 1.36* 1.38* 1.66*  CALCIUM  --  8.4* 8.1* 8.4* 8.0* 7.9* 7.7*  MG 2.3  --  2.0 2.4 2.2 2.2  --   PHOS  --  3.1 5.2* 5.0* 4.5 4.5  --    Estimated Creatinine Clearance: 46.7 mL/min (A) (by C-G formula based on SCr of 1.66 mg/dL (H)). Liver & Pancreas: Recent Labs  Lab 02/17/21 3532 02/18/21 0803 02/19/21 0530 02/20/21 0324 02/21/21 0321  ALBUMIN 2.5* 2.2* 2.3* 2.2* 2.1*   No results for input(s): LIPASE, AMYLASE in the last 168 hours. No results for input(s): AMMONIA in the last 168 hours. Diabetic: Recent Labs    02/21/21 0321  HGBA1C 5.4   Recent Labs  Lab 02/21/21 0316 02/21/21 0514 02/21/21 0747 02/21/21 1006 02/21/21 1038  GLUCAP 95 77 74 67* 160*   Cardiac Enzymes: No results for input(s): CKTOTAL, CKMB, CKMBINDEX, TROPONINI in the last 168 hours. No results for input(s): PROBNP in the last 8760 hours. Coagulation Profile: Recent Labs  Lab 02/20/21 1007  INR 0.9   Thyroid Function Tests: No results for input(s): TSH, T4TOTAL, FREET4, T3FREE, THYROIDAB in the last 72 hours. Lipid Profile: No results for input(s): CHOL, HDL, LDLCALC, TRIG, CHOLHDL, LDLDIRECT in the last 72 hours. Anemia Panel: Recent Labs    02/21/21 0321  VITAMINB12 351  FOLATE 29.5  FERRITIN 347*  TIBC 245*  IRON 30  RETICCTPCT 3.8*   Urine analysis:    Component Value Date/Time   COLORURINE AMBER (A) 01/10/2021 0131   APPEARANCEUR CLOUDY (A) 01/10/2021 0131   APPEARANCEUR Clear 09/18/2020 1153   LABSPEC 1.017 01/10/2021 0131   LABSPEC 1.026 03/01/2013 1844   PHURINE 5.0 01/10/2021 0131   GLUCOSEU NEGATIVE 01/10/2021 0131    GLUCOSEU >=500 03/01/2013 1844   HGBUR NEGATIVE 01/10/2021 0131   BILIRUBINUR NEGATIVE 01/10/2021 0131   BILIRUBINUR neg 10/23/2020 0953   BILIRUBINUR Negative 09/18/2020 1153   BILIRUBINUR Negative 03/01/2013 1844   KETONESUR NEGATIVE 01/10/2021 0131   PROTEINUR NEGATIVE 01/10/2021 0131   UROBILINOGEN 0.2 10/23/2020 0953   NITRITE NEGATIVE 01/10/2021 0131   LEUKOCYTESUR SMALL (A) 01/10/2021 0131   LEUKOCYTESUR Negative 03/01/2013 1844   Sepsis Labs: Invalid input(s): PROCALCITONIN, LACTICIDVEN  Microbiology: Recent Results (from the past 240 hour(s))  Resp Panel by RT-PCR (Flu A&B, Covid) Nasopharyngeal Swab     Status: None   Collection Time: 02/17/21  6:27 AM   Specimen: Nasopharyngeal Swab; Nasopharyngeal(NP) swabs in vial transport medium  Result Value Ref Range Status   SARS Coronavirus 2 by RT PCR NEGATIVE NEGATIVE Final    Comment: (NOTE) SARS-CoV-2 target nucleic acids are NOT DETECTED.  The SARS-CoV-2 RNA is generally detectable in upper respiratory specimens during the acute phase of infection. The lowest concentration of SARS-CoV-2 viral copies this assay can detect is 138 copies/mL. A negative result does not preclude SARS-Cov-2 infection and should not be used as the sole basis for treatment or other patient management decisions. A negative result may occur with  improper specimen collection/handling, submission of specimen other than nasopharyngeal swab, presence of viral mutation(s) within the areas targeted by this assay, and inadequate number of viral copies(<138 copies/mL). A negative result must be combined with clinical observations, patient history, and epidemiological information. The expected result is Negative.  Fact Sheet for Patients:  BloggerCourse.com  Fact Sheet for Healthcare Providers:  SeriousBroker.it  This test is no t yet approved or cleared by the [place] FDA and  has been  authorized for detection and/or diagnosis of SARS-CoV-2 by FDA under an  Emergency Use Authorization (EUA). This EUA will remain  in effect (meaning this test can be used) for the duration of the COVID-19 declaration under Section 564(b)(1) of the Act, 21 U.S.C.section 360bbb-3(b)(1), unless the authorization is terminated  or revoked sooner.       Influenza A by PCR NEGATIVE NEGATIVE Final   Influenza B by PCR NEGATIVE NEGATIVE Final    Comment: (NOTE) The Xpert Xpress SARS-CoV-2/FLU/RSV plus assay is intended as an aid in the diagnosis of influenza from Nasopharyngeal swab specimens and should not be used as a sole basis for treatment. Nasal washings and aspirates are unacceptable for Xpert Xpress SARS-CoV-2/FLU/RSV testing.  Fact Sheet for Patients: BloggerCourse.com  Fact Sheet for Healthcare Providers: SeriousBroker.it  This test is not yet approved or cleared by the [place] FDA and has been authorized for detection and/or diagnosis of SARS-CoV-2 by FDA under an Emergency Use Authorization (EUA). This EUA will remain in effect (meaning this test can be used) for the duration of the COVID-19 declaration under Section 564(b)(1) of the Act, 21 U.S.C. section 360bbb-3(b)(1), unless the authorization is terminated or revoked.  Performed at [facility], [address], [place], [place] [address]     Radiology Studies: CT ABDOMEN PELVIS W CONTRAST  Result Date: 02/21/2021 CLINICAL DATA:  Worsening abdominal pain and distension. Anemia. 4 days postop from C-section. EXAM: CT ABDOMEN AND PELVIS WITH CONTRAST TECHNIQUE: Multidetector CT imaging of the abdomen and pelvis was performed using the standard protocol following bolus administration of intravenous contrast. CONTRAST:  39mL OMNIPAQUE IOHEXOL 300 MG/ML  SOLN COMPARISON:  08/29/2019 FINDINGS: Lower Chest: Small bilateral pleural effusions. Hepatobiliary: No hepatic  masses identified. High attenuation material in the dependent gallbladder may represent tiny gallstones versus gallbladder sludge. No evidence of cholecystitis or biliary ductal dilatation. Pancreas:  No mass or inflammatory changes. Spleen: Within normal limits in size and appearance. Adrenals/Urinary Tract: No masses identified. Moderate right and mild left hydroureteronephrosis is seen due to ureteral compression by the enlarged postpartum uterus. Small amount of gas in urinary bladder is most likely due to recent bladder catheterization. Stomach/Bowel: No evidence of obstruction, inflammatory process or abnormal fluid collections. Vascular/Lymphatic: No pathologically enlarged lymph nodes. No acute vascular findings. Reproductive: Enlarged postpartum uterus is seen, with postop changes from recent cesarean section delivery. A large heterogeneous attenuation hematoma is seen between the lower anterior abdominal wall and anterior and superior wall of the urinary bladder, which measures 15.3 x 7.2 by 11.9 cm, consistent with a large bladder flap hematoma. Other: Small amount of postop free air is noted. Mild ascites is seen as well as diffuse mesenteric and body wall edema. Musculoskeletal:  No suspicious bone lesions identified. IMPRESSION: Postop changes from recent cesarean section delivery, with large bladder flap hematoma measuring 15 cm. Mild ascites, diffuse mesenteric and body wall edema, and small bilateral pleural effusions. Moderate right and mild left hydroureteronephrosis due to ureteral compression by the enlarged postpartum uterus. Tiny gallstones versus gallbladder sludge. No radiographic evidence of cholecystitis. Electronically Signed   By: Danae Orleans M.D.   On: 02/21/2021 15:42   DG CHEST PORT 1 VIEW  Result Date: 02/21/2021 CLINICAL DATA:  PICC line placement EXAM: PORTABLE CHEST 1 VIEW COMPARISON:  Chest x-ray dated February 21, 2021 FINDINGS: Left arm PICC line repositioning with tip now  over the expected area of the mid SVC. Clear lungs. No large pleural effusion or pneumothorax. Cardiac and mediastinal contours are unchanged and within normal limits for AP technique.  IMPRESSION: Left arm PICC line tip is positioned over the expected area of the mid SVC. Electronically Signed   By: Allegra Lai M.D.   On: 02/21/2021 11:56     Nysa Sarin T. Harsh Trulock [facility]  If 7PM-7AM, please contact night-coverage [email or web address] 02/22/2021, 11:27 AM

## 2021-02-22 NOTE — Plan of Care (Signed)
  Problem: Education: Goal: Knowledge of condition will improve Outcome: Completed/Met   Problem: Coping: Goal: Ability to identify and utilize available resources and services will improve Outcome: Completed/Met   Problem: Role Relationship: Goal: Ability to demonstrate positive interaction with newborn will improve Outcome: Completed/Met

## 2021-02-22 NOTE — Progress Notes (Signed)
Post transfusion h/h is 7.1/20.6  .  Appropriate rise noted.  Recheck previously ordered by medicine team tomorrow morning.  Mariel Aloe, MD

## 2021-02-22 NOTE — Lactation Note (Signed)
This note was copied from a baby's chart. Lactation Consultation Note  Patient Name: Girl Reeshemah Nazaryan [id number] Date: 02/22/2021 Reason for consult: Follow-up assessment;Late-preterm 34-36.6wks;NICU baby;Maternal endocrine disorder Age:30 days  LC assisted mom with pumping in baby's room, notices that breasts are filling in, encouraged mom to pump consistently to avoid engorgement. Mom reported pain again in her lower abdomen in the middle of her pumping sessions, OB Specialty care RN Olegario Messier will assist mom afterwards; she was also in baby's room.  Maternal Data    Feeding Mother's Current Feeding Choice: Breast Milk and Donor Milk   Lactation Tools Discussed/Used Tools: Pump Breast pump type: Double-Electric Breast Pump Pump Education: Setup, frequency, and cleaning Reason for Pumping: LPI in NICU Pumping frequency: Mom has not pumped today, OB Specialty care RN Olegario Messier reported that mom has an hematoma on her abdomen and she's in pain Pumped volume: 0 mL- 5 ml in baby's room  Interventions    Discharge Pump: DEBP;Personal  Consult Status Consult Status: Follow-up Date: 02/22/21 Follow-up type: In-patient    Sante Biedermann Venetia Constable 02/22/2021, 5:43 PM

## 2021-02-23 ENCOUNTER — Inpatient Hospital Stay (HOSPITAL_COMMUNITY): Payer: Medicaid Other

## 2021-02-23 LAB — CBC
HCT: 16.6 % — ABNORMAL LOW (ref 36.0–46.0)
HCT: 24.5 % — ABNORMAL LOW (ref 36.0–46.0)
Hemoglobin: 5.6 g/dL — CL (ref 12.0–15.0)
Hemoglobin: 8.4 g/dL — ABNORMAL LOW (ref 12.0–15.0)
MCH: 29.6 pg (ref 26.0–34.0)
MCH: 29.5 pg (ref 26.0–34.0)
MCHC: 33.7 g/dL (ref 30.0–36.0)
MCHC: 34.3 g/dL (ref 30.0–36.0)
MCV: 87.8 fL (ref 80.0–100.0)
MCV: 86 fL (ref 80.0–100.0)
Platelets: 165 10*3/uL (ref 150–400)
Platelets: 197 10*3/uL (ref 150–400)
RBC: 1.89 MIL/uL — ABNORMAL LOW (ref 3.87–5.11)
RBC: 2.85 MIL/uL — ABNORMAL LOW (ref 3.87–5.11)
RDW: 18.4 % — ABNORMAL HIGH (ref 11.5–15.5)
RDW: 16.6 % — ABNORMAL HIGH (ref 11.5–15.5)
WBC: 20.4 10*3/uL — ABNORMAL HIGH (ref 4.0–10.5)
WBC: 22.3 10*3/uL — ABNORMAL HIGH (ref 4.0–10.5)
nRBC: 0 % (ref 0.0–0.2)
nRBC: 0 % (ref 0.0–0.2)

## 2021-02-23 LAB — COMPREHENSIVE METABOLIC PANEL
ALT: 23 U/L (ref 0–44)
AST: 19 U/L (ref 15–41)
Albumin: 1.7 g/dL — ABNORMAL LOW (ref 3.5–5.0)
Alkaline Phosphatase: 53 U/L (ref 38–126)
Anion gap: 7 (ref 5–15)
BUN: 54 mg/dL — ABNORMAL HIGH (ref 6–20)
CO2: 24 mmol/L (ref 22–32)
Calcium: 7.8 mg/dL — ABNORMAL LOW (ref 8.9–10.3)
Chloride: 101 mmol/L (ref 98–111)
Creatinine, Ser: 1.66 mg/dL — ABNORMAL HIGH (ref 0.44–1.00)
GFR, Estimated: 43 mL/min — ABNORMAL LOW (ref >60)
Glucose, Bld: 76 mg/dL (ref 70–99)
Potassium: 5.4 mmol/L — ABNORMAL HIGH (ref 3.5–5.1)
Sodium: 132 mmol/L — ABNORMAL LOW (ref 135–145)
Total Bilirubin: 0.6 mg/dL (ref 0.3–1.2)
Total Protein: 4.4 g/dL — ABNORMAL LOW (ref 6.5–8.1)

## 2021-02-23 LAB — BRAIN NATRIURETIC PEPTIDE: B Natriuretic Peptide: 448.7 pg/mL — ABNORMAL HIGH (ref 0.0–100.0)

## 2021-02-23 LAB — PREPARE RBC (CROSSMATCH)

## 2021-02-23 LAB — GLUCOSE, CAPILLARY: Glucose-Capillary: 227 mg/dL — ABNORMAL HIGH (ref 70–99)

## 2021-02-23 LAB — PHOSPHORUS: Phosphorus: 4.9 mg/dL — ABNORMAL HIGH (ref 2.5–4.6)

## 2021-02-23 LAB — MAGNESIUM: Magnesium: 2.2 mg/dL (ref 1.7–2.4)

## 2021-02-23 LAB — POTASSIUM: Potassium: 5.6 mmol/L — ABNORMAL HIGH (ref 3.5–5.1)

## 2021-02-23 MED ORDER — ACETAMINOPHEN 500 MG PO TABS
1000.0000 mg | ORAL_TABLET | Freq: Four times a day (QID) | ORAL | Status: DC | PRN
  Administered 2021-02-26 – 2021-03-03 (×6): 1000 mg via ORAL
  Filled 2021-02-23 (×6): qty 2

## 2021-02-23 MED ORDER — SODIUM CHLORIDE 0.9% IV SOLUTION
Freq: Once | INTRAVENOUS | Status: AC
  Administered 2021-02-23: 10 mL/h via INTRAVENOUS

## 2021-02-23 MED ORDER — INSULIN ASPART 100 UNIT/ML IJ SOLN
2.0000 [IU] | Freq: Once | INTRAMUSCULAR | Status: AC
  Administered 2021-02-23: 2 [IU] via SUBCUTANEOUS

## 2021-02-23 MED ORDER — INSULIN ASPART 100 UNIT/ML IJ SOLN
2.0000 [IU] | Freq: Three times a day (TID) | INTRAMUSCULAR | Status: DC
  Administered 2021-02-23: 2 [IU] via SUBCUTANEOUS

## 2021-02-23 MED ORDER — ALTEPLASE 2 MG IJ SOLR
2.0000 mg | Freq: Once | INTRAMUSCULAR | Status: DC
  Filled 2021-02-23: qty 2

## 2021-02-23 MED ORDER — IOHEXOL 350 MG/ML SOLN
80.0000 mL | Freq: Once | INTRAVENOUS | Status: AC
  Administered 2021-02-23: 75 mL via INTRAVENOUS

## 2021-02-23 MED ORDER — INSULIN GLARGINE-YFGN 100 UNIT/ML ~~LOC~~ SOLN
6.0000 [IU] | Freq: Every day | SUBCUTANEOUS | Status: DC
  Administered 2021-02-24: 6 [IU] via SUBCUTANEOUS
  Filled 2021-02-23 (×2): qty 0.06

## 2021-02-23 MED ORDER — BISACODYL 10 MG RE SUPP
10.0000 mg | Freq: Once | RECTAL | Status: DC
  Filled 2021-02-23: qty 1

## 2021-02-23 MED ORDER — SODIUM ZIRCONIUM CYCLOSILICATE 5 G PO PACK
5.0000 g | PACK | Freq: Once | ORAL | Status: AC
  Administered 2021-02-23: 5 g via ORAL
  Filled 2021-02-23: qty 1

## 2021-02-23 NOTE — Progress Notes (Addendum)
PROGRESS NOTE  Ashley Camacho KTG:[id number] DOB: 1991/05/13   PCP: Hildred Laser, MD  Patient is from: Home.  DOA: 12/16/2020 LOS: 69  Chief complaints:  No chief complaint on file.    Brief Narrative / Interim history: 30 y.o. G2P0101 with a history of T1DM, chronic HFrEF and recent hospitalization at [facility] on 6/30 for N/V and rhinovirus pneumonia requiring transfer to [facility].  She was discharged home from [facility] on 7/20 only to return on 7/22 with starvation ketoacidosis in the setting of nausea and vomiting likely due to uncontrolled DM-1 and gastroparesis, and brachial vein DVT.She was a started on TPN through PICC line and transferred to [facility] for cortrak and TF initiation.   Hospital course noteworthy of systolic CHF exacerbation (on IV diuretics), AI (p.o. Cortef), ABLA, hematemesis in the setting of grade C esophagitis on EGD, hematuria (seems to have resolved), moderate right hydronephrosis (improved), Enterococcus faecalis UTI (completed treatment), acute urinary retention (resolved)...  Patient underwent cesarean section on 9/27.  Had postop blood loss with large bladder flap hematoma measuring requiring blood transfusions.  CTA did not show active extravasation  Subjective: Seen and examined earlier this morning.  Hgb dropped to 5.6 despite transfusion again.  She reports abdominal pain at surgical site.  Pain has improved to 3/10 after IV Dilaudid.  Feels a little tired and a little dizzy.  Denies shortness of breath, chest pain or orthopnea.  Getting blood transfusion.  Objective: Vitals:   02/23/21 0942 02/23/21 0943 02/23/21 1154 02/23/21 1155  BP:  127/61 111/64   Pulse:  94 97   Resp:      Temp: 98 F (36.7 C)  97.8 F (36.6 C)   TempSrc: Oral  Oral   SpO2:  99% 97% 96%  Weight:      Height:        Intake/Output Summary (Last 24 hours) at 02/23/2021 1402 Last data filed at 02/23/2021 1154 Gross per 24 hour  Intake 888 ml  Output 1150 ml  Net -262 ml   Filed  Weights   01/24/21 0603 01/26/21 0530 02/08/21 1157  Weight: 81.7 kg 80.6 kg 76.1 kg    Examination  GENERAL: No apparent distress.  Nontoxic. HEENT: MMM.  Vision and hearing grossly intact.  NECK: Supple.  No apparent JVD.  RESP: 96% on RA.  No IWOB.  Fair aeration bilaterally. CVS:  RRR. Heart sounds normal.  ABD/GI/GU: BS+. Abd soft.  Mild tenderness with palpation. MSK/EXT:  Moves extremities. No apparent deformity.  1-2 BLE edema more in her thighs. SKIN: Honeycomb dressing over surgical site. NEURO: Awake and alert. Oriented appropriately.  No apparent focal neuro deficit. PSYCH: Calm. Normal affect.   Procedures:  8/24-EGD with grade C esophagitis. 9/27-cesarean section  Microbiology summarized: 7/30-wet prep negative. 7/30-urine culture with insignificant growth. 8/15-urine culture with pansensitive Enterococcus faecalis 8/22-C. difficile negative 9/6 & 7-urine culture with multiple species 9/9-urine culture negative  Assessment & Plan: Volume overload/acute systolic CHF with improved EF-BLE edema seems to be worse, mainly in her thighs.  About 1.1 L UOP with net 0 L/24 hours.  Net -21 L but not sure if this is accurate.   Cr trending up likely from contrast for CT. No shortness of breath, orthopnea or chest pain.  Anemia, poor nutrition/hypoalbuminemia and steroid might be contributing to fluid status.  -Continue holding Lasix as creatinine is uptrending and blood pressure is soft -Closely monitor fluid status, renal functions and electrolytes -Cardiology signed off.  Postsurgical ABLA/hematoma: Hgb continues  to drop despite multiple blood transfusions.  CTA did not show active extravasation.  Hemodynamically stable.  Lovenox on hold.  Primary transfusing 2 units Recent Labs    02/18/21 0803 02/19/21 0530 02/20/21 0324 02/20/21 1557 02/21/21 0321 02/21/21 1010 02/22/21 0015 02/22/21 0204 02/22/21 1332 02/23/21 0447  HGB 6.5* 9.6* 8.5* 8.2* 6.2* 6.3* 5.9* 6.2*  7.1* 5.6*  -Monitor H&H -Defer further care to primary team.  AKI/azotemia: Cr/BUN up but plateauing.  This could be due to low BP, anemia and contrast for CT. Recent Labs    02/14/21 0656 02/15/21 0500 02/16/21 0319 02/17/21 4098 02/18/21 0803 02/19/21 0530 02/20/21 0324 02/21/21 0321 02/22/21 0015 02/23/21 0447  BUN 32* 32* 37* 36* 33* 30* 35* 45* 52* 54*  CREATININE 1.32* 1.60* 1.70* 1.49* 1.22* 1.18* 1.36* 1.38* 1.66* 1.66*  -Closely monitor renal function -Renal US and urology consult if renal function gets worse  Adrenal insufficiency: low cortisol level with minimal to no response to ACTH suggests primary AI.  ACTH level low but drawn after she started stress dose steroid.  -Back on maintenance oral Cortef. May need stress dose steroid with slow taper. -Outpatient follow-up with endocrinology after discharge   Gross hematuria, unclear etiology:  Renal US with hydronephrosis.  Hematuria resolved.  Acute urinary tension/bilateral hydronephrosis-moderate right and mild left.  Urinary tension resolved.  Controlled DM-1 with with hypo and hyperglycemia: A1c 5.8% on 7/1.  Very labile CBG probably due to poor p.o. intake Recent Labs  Lab 02/21/21 0316 02/21/21 0514 02/21/21 0747 02/21/21 1006 02/21/21 1038  GLUCAP 95 77 74 67* 160*  -Changing NPH to Lantus with holding parameters starting tomorrow -Continue CBG monitoring and every 4 SSI-very sensitive -Decreased NovoLog from 3 to 2 units 3 times daily with meals if she eats> 50% of her meals -Mealtime coverage and Lantus to be held if CBG less than 100 or she doesn't finish 50% of her meals  Right brachial vein DVT: Dx 12/07/20: seems to be recannulating based on repeat U/S.  Lovenox held this morning due to bleeding from injection site.  Coag labs within normal. -Holding Lovenox in the setting of bleeding and hematoma  Essential hypertension: Normotensive this morning. -Discontinue Pradaxa and hydralazine  Mild  hyperkalemia: K5.4.  Due to AKI?   -Lokelma 5 g x 1 -Recheck in the afternoon with afternoon CBC  Chronic insomnia -On Unisom 12.5 mg twice daily   Intractable nausea/vomiting-resolved.  Elevated liver enzymes: Resolved. Acute thrombocytopenia: Resolved Epistaxis: Resolved Hematemesis: Resolved.   Hypokalemia/hypomagnesemia: Resolved.    Third trimester intrauterine pregnancy: s/p C-section delivery.   Enterococcus faecalis UTI-resolved   Limited venous access:  -New PICC line on 02/21/2021  Leukocytosis/bandemia: Likely demargination versus infection.  Improving.  Inadequate oral intake Body mass index is 31.71 kg/m. Nutrition Problem: Inadequate oral intake Etiology: nausea, vomiting Signs/Symptoms: NPO status Interventions: TPN, Tube feeding   DVT prophylaxis:  SCD's Start: 02/17/21 1953 Place TED hose Start: 01/17/21 1655 Patient is on heparin drip for DVT.  Code Status: Full code Family Communication: Patient and/or RN.  No family member at bedside.  Level of care: Postpartum Status is: Inpatient   Dispo: Per primary   Sch Meds:  Scheduled Meds:  sodium chloride   Intravenous Once   acetaminophen  1,000 mg Oral Q6H   Chlorhexidine Gluconate Cloth  6 each Topical Daily   cholecalciferol  1,000 Units Oral Daily   famotidine  20 mg Oral BID   feeding supplement (GLUCERNA SHAKE)  237 mL Oral  BID BM   hydrocortisone  25 mg Oral Q24H   And   hydrocortisone  15 mg Oral Q24H   insulin aspart  0-6 Units Subcutaneous TID WC   insulin aspart  3 Units Subcutaneous TID WC   insulin NPH Human  6 Units Subcutaneous BID PC   measles, mumps & rubella vaccine  0.5 mL Subcutaneous Once   pantoprazole  40 mg Oral Daily   prenatal multivitamin  1 tablet Oral Q1200   simethicone  80 mg Oral TID PC   sodium chloride flush  10-40 mL Intracatheter Q12H   Tdap  0.5 mL Intramuscular Once   Continuous Infusions:   PRN Meds:.coconut oil, witch hazel-glycerin **AND**  dibucaine, diphenhydrAMINE, HYDROmorphone (DILAUDID) injection, menthol-cetylpyridinium, metoCLOPramide, oxyCODONE, simethicone, sodium chloride, zolpidem  Antimicrobials: Anti-infectives (From admission, onward)    Start     Dose/Rate Route Frequency Ordered Stop   01/30/21 1315  amoxicillin (AMOXIL) capsule 500 mg  Status:  Discontinued        500 mg Oral 2 times daily 01/30/21 1218 02/01/21 1426   01/30/21 1200  ampicillin (OMNIPEN) 2 g in sodium chloride 0.9 % 100 mL IVPB  Status:  Discontinued        2 g 300 mL/hr over 20 Minutes Intravenous Every 6 hours 01/30/21 1105 01/30/21 1218   01/06/21 1630  amoxicillin (AMOXIL) 250 MG/5ML suspension 500 mg        500 mg Per Tube Every 8 hours 01/06/21 1534 01/13/21 0602   01/06/21 1615  amoxicillin (AMOXIL) chewable tablet 500 mg  Status:  Discontinued        500 mg Oral Every 8 hours 01/06/21 1528 01/06/21 1532        I have personally reviewed the following labs and images: CBC: Recent Labs  Lab 02/21/21 0321 02/21/21 1010 02/22/21 0015 02/22/21 0204 02/22/21 1332 02/23/21 0447  WBC 11.5* 15.5* 20.2*  --  23.3* 20.4*  HGB 6.2* 6.3* 5.9* 6.2* 7.1* 5.6*  HCT 19.3* 19.6* 17.6* 18.8* 20.6* 16.6*  MCV 93.2 92.9 90.7  --  86.6 87.8  PLT 165 185 158  --  178 165   BMP &GFR Recent Labs  Lab 02/18/21 0803 02/19/21 0530 02/20/21 0324 02/21/21 0321 02/22/21 0015 02/23/21 0447  NA 138 136 137 138 135 132*  K 4.2 4.3 4.7 4.3 4.9 5.4*  CL 105 104 105 108 105 101  CO2 27 25 25 26 24 24   GLUCOSE 124* 120* 112* 98 180* 76  BUN 33* 30* 35* 45* 52* 54*  CREATININE 1.22* 1.18* 1.36* 1.38* 1.66* 1.66*  CALCIUM 8.1* 8.4* 8.0* 7.9* 7.7* 7.8*  MG 2.0 2.4 2.2 2.2  --  2.2  PHOS 5.2* 5.0* 4.5 4.5  --  4.9*   Estimated Creatinine Clearance: 46.7 mL/min (A) (by C-G formula based on SCr of 1.66 mg/dL (H)). Liver & Pancreas: Recent Labs  Lab 02/18/21 0803 02/19/21 0530 02/20/21 0324 02/21/21 0321 02/23/21 0447  AST  --   --   --    --  19  ALT  --   --   --   --  23  ALKPHOS  --   --   --   --  53  BILITOT  --   --   --   --  0.6  PROT  --   --   --   --  4.4*  ALBUMIN 2.2* 2.3* 2.2* 2.1* 1.7*   No results for input(s): LIPASE, AMYLASE in the last  168 hours. No results for input(s): AMMONIA in the last 168 hours. Diabetic: Recent Labs    02/21/21 0321  HGBA1C 5.4   Recent Labs  Lab 02/21/21 0316 02/21/21 0514 02/21/21 0747 02/21/21 1006 02/21/21 1038  GLUCAP 95 77 74 67* 160*   Cardiac Enzymes: No results for input(s): CKTOTAL, CKMB, CKMBINDEX, TROPONINI in the last 168 hours. No results for input(s): PROBNP in the last 8760 hours. Coagulation Profile: Recent Labs  Lab 02/20/21 1007  INR 0.9   Thyroid Function Tests: No results for input(s): TSH, T4TOTAL, FREET4, T3FREE, THYROIDAB in the last 72 hours. Lipid Profile: No results for input(s): CHOL, HDL, LDLCALC, TRIG, CHOLHDL, LDLDIRECT in the last 72 hours. Anemia Panel: Recent Labs    02/21/21 0321  VITAMINB12 351  FOLATE 29.5  FERRITIN 347*  TIBC 245*  IRON 30  RETICCTPCT 3.8*   Urine analysis:    Component Value Date/Time   COLORURINE AMBER (A) 01/10/2021 0131   APPEARANCEUR CLOUDY (A) 01/10/2021 0131   APPEARANCEUR Clear 09/18/2020 1153   LABSPEC 1.017 01/10/2021 0131   LABSPEC 1.026 03/01/2013 1844   PHURINE 5.0 01/10/2021 0131   GLUCOSEU NEGATIVE 01/10/2021 0131   GLUCOSEU >=500 03/01/2013 1844   HGBUR NEGATIVE 01/10/2021 0131   BILIRUBINUR NEGATIVE 01/10/2021 0131   BILIRUBINUR neg 10/23/2020 0953   BILIRUBINUR Negative 09/18/2020 1153   BILIRUBINUR Negative 03/01/2013 1844   KETONESUR NEGATIVE 01/10/2021 0131   PROTEINUR NEGATIVE 01/10/2021 0131   UROBILINOGEN 0.2 10/23/2020 0953   NITRITE NEGATIVE 01/10/2021 0131   LEUKOCYTESUR SMALL (A) 01/10/2021 0131   LEUKOCYTESUR Negative 03/01/2013 1844   Sepsis Labs: Invalid input(s): PROCALCITONIN, LACTICIDVEN  Microbiology: Recent Results (from the past 240 hour(s))   Resp Panel by RT-PCR (Flu A&B, Covid) Nasopharyngeal Swab     Status: None   Collection Time: 02/17/21  6:27 AM   Specimen: Nasopharyngeal Swab; Nasopharyngeal(NP) swabs in vial transport medium  Result Value Ref Range Status   SARS Coronavirus 2 by RT PCR NEGATIVE NEGATIVE Final    Comment: (NOTE) SARS-CoV-2 target nucleic acids are NOT DETECTED.  The SARS-CoV-2 RNA is generally detectable in upper respiratory specimens during the acute phase of infection. The lowest concentration of SARS-CoV-2 viral copies this assay can detect is 138 copies/mL. A negative result does not preclude SARS-Cov-2 infection and should not be used as the sole basis for treatment or other patient management decisions. A negative result may occur with  improper specimen collection/handling, submission of specimen other than nasopharyngeal swab, presence of viral mutation(s) within the areas targeted by this assay, and inadequate number of viral copies(<138 copies/mL). A negative result must be combined with clinical observations, patient history, and epidemiological information. The expected result is Negative.  Fact Sheet for Patients:  BloggerCourse.com  Fact Sheet for Healthcare Providers:  SeriousBroker.it  This test is no t yet approved or cleared by the [place] FDA and  has been authorized for detection and/or diagnosis of SARS-CoV-2 by FDA under an Emergency Use Authorization (EUA). This EUA will remain  in effect (meaning this test can be used) for the duration of the COVID-19 declaration under Section 564(b)(1) of the Act, 21 U.S.C.section 360bbb-3(b)(1), unless the authorization is terminated  or revoked sooner.       Influenza A by PCR NEGATIVE NEGATIVE Final   Influenza B by PCR NEGATIVE NEGATIVE Final    Comment: (NOTE) The Xpert Xpress SARS-CoV-2/FLU/RSV plus assay is intended as an aid in the diagnosis of influenza from  Nasopharyngeal swab specimens  and should not be used as a sole basis for treatment. Nasal washings and aspirates are unacceptable for Xpert Xpress SARS-CoV-2/FLU/RSV testing.  Fact Sheet for Patients: BloggerCourse.com  Fact Sheet for Healthcare Providers: SeriousBroker.it  This test is not yet approved or cleared by the [place] FDA and has been authorized for detection and/or diagnosis of SARS-CoV-2 by FDA under an Emergency Use Authorization (EUA). This EUA will remain in effect (meaning this test can be used) for the duration of the COVID-19 declaration under Section 564(b)(1) of the Act, 21 U.S.C. section 360bbb-3(b)(1), unless the authorization is terminated or revoked.  Performed at [facility], [address], [place], [place] [address]     Radiology Studies: CT Angio Abd/Pel w/ and/or w/o  Result Date: 02/23/2021 CLINICAL DATA:  30 year old female with a history worsening abdominal pain and anemia after prior cesarean section EXAM: CTA ABDOMEN AND PELVIS WITHOUT AND WITH CONTRAST TECHNIQUE: Multidetector CT imaging of the abdomen and pelvis was performed using the standard protocol during bolus administration of intravenous contrast. Multiplanar reconstructed images and MIPs were obtained and reviewed to evaluate the vascular anatomy. CONTRAST:  21mL OMNIPAQUE IOHEXOL 350 MG/ML SOLN COMPARISON:  02/21/2021 FINDINGS: VASCULAR Aorta: Unremarkable course, caliber, contour of the abdominal aorta. No dissection, aneurysm, or periaortic fluid. No atherosclerotic changes. Celiac: Patent, with no significant atherosclerotic changes. SMA: Patent, with no significant atherosclerotic changes. Renals: - Right: There are 2 right renal arteries. The larger arises cephalad, smaller caudal. There is possibility of a right ovarian artery from the inferior and smaller of the right renal arteries. - Left: There are 2 left renal arteries.  The larger arises cephalad, the smaller more caudal. There is a left ovarian artery that arises from the smaller/inferior of the renal arteries. IMA: Inferior mesenteric artery is patent. Right lower extremity: Unremarkable course, caliber, and contour of the right iliac system. No aneurysm, dissection, or occlusion. Hypogastric artery is patent. Common femoral artery patent. Proximal SFA and profunda femoris patent. Pelvic arteries from the hypogastric artery are patent. A right uterine artery arises from the hypogastric branches, anterior division. Obturator artery arises from the hypogastric artery branches. Patent inferior epigastric artery no CT evidence of extravasation of contrast, pooling of contrast, or pseudoaneurysm as a definitive source hemorrhage. Left lower extremity: Unremarkable course, caliber, and contour of the left iliac system. No aneurysm, dissection, or occlusion. Hypogastric artery is patent. Common femoral artery patent. Proximal SFA and profunda femoris patent. Pelvic arteries from the hypogastric artery patent. A left uterine artery arises from the branches of the hypogastric anterior division. Obturator artery arises as a replaced artery from the left inferior epigastric artery. Patent inferior epigastric artery. There is no evidence of contrast pooling, extravasation of contrast, or pseudoaneurysm as a definitive source of hemorrhage. Veins: Unremarkable appearance of the venous system. Review of the MIP images confirms the above findings. NON-VASCULAR Lower chest: Atelectatic changes at the lung bases. Trace bilateral pleural effusions. Hepatobiliary: Unremarkable appearance of the liver. Hyperdense material layered dependently within the gallbladder, either sludge/cholelithiasis or potentially vicarious excretion of contrast. No wall thickening or significant adjacent inflammatory changes. Pancreas: Unremarkable. Spleen: Unremarkable. Adrenals/Urinary Tract: - Right adrenal gland:  Unremarkable - Left adrenal gland: Unremarkable. - Right kidney: Developing hydronephrosis/pelvicaliectasis and dilation of the right ureter. No calcified radiopaque stones distally. No focal lesion. - Left Kidney: Left kidney is slightly smaller compared to the right. Mild pelvicaliectasis and dilation of the left ureter. No radiopaque stones. No focal lesion. - Urinary Bladder:  Urinary bladder partially distended with excreted contrast. Stomach/Bowel: - Stomach: Unremarkable. - Small bowel: Unremarkable - Appendix: Normal. - Colon: Moderate to large stool burden. No transition point. No focal inflammatory changes. Lymphatic: Body wall edema/anasarca. No lymphadenopathy.  Small lymph nodes of the inguinal region. Mesenteric: Edema of the mesentery. Small volume of dependently layered free fluid within the pelvis. This appears mostly low-density with no pooling of extravasated contrast Reproductive: Postpartum uterus, enlarged with free fluid in the canal. There is no evidence of extravasation of contrast, pooling of contrast to indicate a source of uterine hemorrhage. Unremarkable adnexa. Other: Redemonstration of surgical changes cesarean section. Extraperitoneal hematoma again demonstrated within the low anterior abdominal wall/pelvic musculature, subjacent to the site of the surgery. The AP dimension has enlarged slightly compared to the prior, previous 9.7 cm, current 12.3 cm. The left right diameter is essentially unchanged. There is no extravasation of contrast, pooling of contrast, or pseudoaneurysm that would definitively localize a source of hemorrhage. Evidence of injection granulomas of the abdominal wall. Musculoskeletal: No acute displaced fracture. No significant degenerative changes. IMPRESSION: The CT angiogram is negative for direct evidence of acute hemorrhage. Redemonstration of extraperitoneal hemorrhage in the anterior abdominal wall musculature, underlying the surgical changes of cesarean  section. Although difficult to measure precisely, it appears that the hematoma has enlarged in the interval, with increasing AP dimension, previously 9.7 cm, currently 12 cm. Abdominal wall and mesenteric edema, trace bilateral pleural effusions and ascites. Bilateral, right greater than left ureteral-hydronephrosis. No radiopaque stones are identified, with the most likely etiology being secondary to external compression. Surgical injury can not be excluded. Additional ancillary findings as above. Signed, Yvone Neu. Reyne Dumas, RPVI [facility] Radiology Electronically Signed   By: Gilmer Mor D.O.   On: 02/23/2021 09:40     Maxtyn Nuzum T. Ashira Kirsten [facility]  If 7PM-7AM, please contact night-coverage [email or web address] 02/23/2021, 2:02 PM

## 2021-02-23 NOTE — Progress Notes (Signed)
POSTPARTUM PROGRESS NOTE  Post Partum Day 6  Subjective:  ALAURA SCHIPPERS is a 30 y.o. [phone] s/p repeat c/s and BTL at [redacted]w[redacted]d.  Nursing staff called with morning h/h of 5.6 which is a decrease from 7.1.  There is concern for ongoing bleed. Interventional radiology has been contacted for evaluation of pelvis and possible treatment.   She denies any problems with ambulating or po intake. Pt does note some mild discomfort with voiding and has not had a bowel movement since Wednesday or Thursday.  Denies nausea or vomiting.  Pain is moderately controlled.  Lochia is WNL.  Objective: Blood pressure (!) 114/48, pulse 94, temperature 97.9 F (36.6 C), temperature source Oral, resp. rate 16, height 5\' 1"  (1.549 m), weight 76.1 kg, SpO2 98 %, unknown if currently breastfeeding.  Physical Exam:  General: alert, cooperative and no distress Chest: no respiratory distress Heart:regular rate, distal pulses intact Abdomen: firm mass present just anterior to the incision, no skin discoloration.  Positive bowel sounds Uterine Fundus: firm, appropriately tender DVT Evaluation: No calf swelling or tenderness Extremities: minimal edema Skin: warm, dry  Recent Labs    02/22/21 1332 02/23/21 0447  HGB 7.1* 5.6*  HCT 20.6* 16.6*    Assessment/Plan: TAKEILA THAYNE is a 30 y.o. G2P0202 s/p repeat cesarean section and BTL at [redacted]w[redacted]d   PPD#6 -  Concern for ongoing bleed. Pt currently is being transfused 2 units PRBC.   Interventional radiology consult placed and radiologist contacted.   He has ordered CTA for evaluation of bleeding. NPO for now. Vitals are currently stable  LOS: 69 days   [redacted]w[redacted]d, MD Faculty attending 02/23/2021, 6:54 AM

## 2021-02-23 NOTE — Progress Notes (Signed)
CTA reviewed.  Slight overall enlargement of extraperitoneal, lower abdominal hematoma without active extravasation.  Uterus appears normal post-gravid without AVM, pseudoaneurysm, or endometrial hemorrhage.  No viable targets for prophylactic embolization.  Recommend continued resuscitation as needed, abdominal binder as tolerated.  Agree with continuing to hold anticoagulation, and consider repeat RUE duplex if clinically worsening RUE DVT.    If the patient does not improve from anemia standpoint or worsens hemodynamically, angiographic interrogation of the inferior epigastric arteries could be considered.    IR will follow.    Marliss Coots, MD Pager: [phone]

## 2021-02-23 NOTE — Progress Notes (Signed)
Date and time results received: 02/23/21 0535  (use smartphrase ".now" to insert current time)  Test: HGB  Critical Value: 5.6  Name of Provider Notified: Dr Donavan Foil  Orders Received? Or Actions Taken?: no new orders

## 2021-02-23 NOTE — Progress Notes (Signed)
OB Note 02/23/2021 1025am Per IR, hematoma looks bigger (12 from 10cm) and inferior epigastric arteries look fine and no suspicious branches leading to hematoma with recommendation for exp management and if no improvement in the next 24-48h then can consider angiogram to eval inferior epigastric for any occult bleeders not seen on CTA today.  2nd unit PRBC to be done around noon; rpt CBC ordered for 1600 today. Patient updated (patient pumping currently) and honeycomb dressing removed (had small spot of old blood). Incision c/d/I and no e/o infection, separation or drainage. Still with indurated area above the skin to a few cm below the umbilicus which is likely the hematoma and it's mildly ttp. Pt states minimal VB/lochia.   Patient Vitals for the past 24 hrs:  BP Temp Temp src Pulse Resp SpO2  02/23/21 0943 127/61 -- -- 94 -- 99 %  02/23/21 0942 -- 98 F (36.7 C) Oral -- -- --  02/23/21 0857 (!) 124/57 98.7 F (37.1 C) Oral 98 -- --  02/23/21 0637 (!) 114/48 97.9 F (36.6 C) Oral 94 16 98 %  02/23/21 0610 (!) 114/43 98.4 F (36.9 C) Oral 93 16 99 %  02/23/21 0406 (!) 109/43 98.1 F (36.7 C) Oral 94 16 100 %  02/22/21 2306 (!) 109/48 98.8 F (37.1 C) Oral 92 16 99 %  02/22/21 2200 -- 99 F (37.2 C) Oral -- -- --  02/22/21 1919 (!) 110/50 99.2 F (37.3 C) Oral 96 18 97 %  02/22/21 1130 (!) 115/50 99.3 F (37.4 C) Oral (!) 102 17 95 %    Narrative & Impression  CLINICAL DATA:  30 year old female with a history worsening abdominal pain and anemia after prior cesarean section   EXAM: CTA ABDOMEN AND PELVIS WITHOUT AND WITH CONTRAST   TECHNIQUE: Multidetector CT imaging of the abdomen and pelvis was performed using the standard protocol during bolus administration of intravenous contrast. Multiplanar reconstructed images and MIPs were obtained and reviewed to evaluate the vascular anatomy.   CONTRAST:  63mL OMNIPAQUE IOHEXOL 350 MG/ML SOLN   COMPARISON:  02/21/2021    FINDINGS: VASCULAR   Aorta: Unremarkable course, caliber, contour of the abdominal aorta. No dissection, aneurysm, or periaortic fluid. No atherosclerotic changes.   Celiac: Patent, with no significant atherosclerotic changes.   SMA: Patent, with no significant atherosclerotic changes.   Renals:   - Right: There are 2 right renal arteries. The larger arises cephalad, smaller caudal. There is possibility of a right ovarian artery from the inferior and smaller of the right renal arteries.   - Left: There are 2 left renal arteries. The larger arises cephalad, the smaller more caudal. There is a left ovarian artery that arises from the smaller/inferior of the renal arteries.   IMA: Inferior mesenteric artery is patent.   Right lower extremity:   Unremarkable course, caliber, and contour of the right iliac system. No aneurysm, dissection, or occlusion. Hypogastric artery is patent. Common femoral artery patent. Proximal SFA and profunda femoris patent.   Pelvic arteries from the hypogastric artery are patent. A right uterine artery arises from the hypogastric branches, anterior division. Obturator artery arises from the hypogastric artery branches.   Patent inferior epigastric artery no CT evidence of extravasation of contrast, pooling of contrast, or pseudoaneurysm as a definitive source hemorrhage.   Left lower extremity:   Unremarkable course, caliber, and contour of the left iliac system. No aneurysm, dissection, or occlusion. Hypogastric artery is patent. Common femoral artery patent. Proximal SFA and profunda  femoris patent.   Pelvic arteries from the hypogastric artery patent. A left uterine artery arises from the branches of the hypogastric anterior division.   Obturator artery arises as a replaced artery from the left inferior epigastric artery.   Patent inferior epigastric artery. There is no evidence of contrast pooling, extravasation of contrast, or  pseudoaneurysm as a definitive source of hemorrhage.   Veins: Unremarkable appearance of the venous system.   Review of the MIP images confirms the above findings.   NON-VASCULAR   Lower chest: Atelectatic changes at the lung bases. Trace bilateral pleural effusions.   Hepatobiliary: Unremarkable appearance of the liver. Hyperdense material layered dependently within the gallbladder, either sludge/cholelithiasis or potentially vicarious excretion of contrast. No wall thickening or significant adjacent inflammatory changes.   Pancreas: Unremarkable.   Spleen: Unremarkable.   Adrenals/Urinary Tract:   - Right adrenal gland: Unremarkable   - Left adrenal gland: Unremarkable.   - Right kidney: Developing hydronephrosis/pelvicaliectasis and dilation of the right ureter. No calcified radiopaque stones distally. No focal lesion.   - Left Kidney: Left kidney is slightly smaller compared to the right. Mild pelvicaliectasis and dilation of the left ureter. No radiopaque stones. No focal lesion.   - Urinary Bladder: Urinary bladder partially distended with excreted contrast.   Stomach/Bowel:   - Stomach: Unremarkable.   - Small bowel: Unremarkable   - Appendix: Normal.   - Colon: Moderate to large stool burden. No transition point. No focal inflammatory changes.   Lymphatic: Body wall edema/anasarca.   No lymphadenopathy.  Small lymph nodes of the inguinal region.   Mesenteric: Edema of the mesentery. Small volume of dependently layered free fluid within the pelvis. This appears mostly low-density with no pooling of extravasated contrast   Reproductive: Postpartum uterus, enlarged with free fluid in the canal. There is no evidence of extravasation of contrast, pooling of contrast to indicate a source of uterine hemorrhage. Unremarkable adnexa.   Other: Redemonstration of surgical changes cesarean section. Extraperitoneal hematoma again demonstrated within the low  anterior abdominal wall/pelvic musculature, subjacent to the site of the surgery. The AP dimension has enlarged slightly compared to the prior, previous 9.7 cm, current 12.3 cm. The left right diameter is essentially unchanged. There is no extravasation of contrast, pooling of contrast, or pseudoaneurysm that would definitively localize a source of hemorrhage.   Evidence of injection granulomas of the abdominal wall.   Musculoskeletal: No acute displaced fracture. No significant degenerative changes.   IMPRESSION: The CT angiogram is negative for direct evidence of acute hemorrhage.   Redemonstration of extraperitoneal hemorrhage in the anterior abdominal wall musculature, underlying the surgical changes of cesarean section. Although difficult to measure precisely, it appears that the hematoma has enlarged in the interval, with increasing AP dimension, previously 9.7 cm, currently 12 cm.   Abdominal wall and mesenteric edema, trace bilateral pleural effusions and ascites.   Bilateral, right greater than left ureteral-hydronephrosis. No radiopaque stones are identified, with the most likely etiology being secondary to external compression. Surgical injury can not be excluded.   Additional ancillary findings as above.   Signed,   Yvone Neu. Reyne Dumas, RPVI   [facility]   [facility]     Electronically Signed   By: Gilmer Mor D.O.   On: 02/23/2021 09:40   Results for DESHONDA, CRYDERMAN (MRN [id number]) as of 02/23/2021 11:07  Ref. Range 02/21/2021 10:10 02/22/2021 00:15 02/22/2021 02:04 02/22/2021 13:32 02/23/2021 04:47  WBC Latest Ref Range: 4.0 -  10.5 K/uL 15.5 (H) 20.2 (H)  23.3 (H) 20.4 (H)  RBC Latest Ref Range: 3.87 - 5.11 MIL/uL 2.11 (L) 1.94 (L)  2.38 (L) 1.89 (L)  Hemoglobin Latest Ref Range: 12.0 - 15.0 g/dL 6.3 (LL) 5.9 (LL) 6.2 (LL) 7.1 (L) 5.6 (LL)  HCT Latest Ref Range: 36.0 - 46.0 % 19.6 (L) 17.6 (L) 18.8 (L) 20.6  (L) 16.6 (L)  MCV Latest Ref Range: 80.0 - 100.0 fL 92.9 90.7  86.6 87.8  MCH Latest Ref Range: 26.0 - 34.0 pg 29.9 30.4  29.8 29.6  MCHC Latest Ref Range: 30.0 - 36.0 g/dL 16.6 06.3  01.6 01.0  RDW Latest Ref Range: 11.5 - 15.5 % 18.2 (H) 20.6 (H)  18.3 (H) 18.4 (H)  Platelets Latest Ref Range: 150 - 400 K/uL 185 158  178 165  nRBC Latest Ref Range: 0.0 - 0.2 % 0.0 0.0  0.0 0.0     Okay to resume diet. Follow up afternoon CBC.   Cornelia Copa MD Attending Center for [facility] (Faculty Practice) GYN Consult Phone: [phone] (M-F, [phone]) & [phone] (Off hours, weekends, holidays)

## 2021-02-23 NOTE — Progress Notes (Signed)
Results for Ashley Camacho, Ashley Camacho (MRN [id number]) as of 02/23/2021 05:41  Ref. Range 02/23/2021 04:47  WBC Latest Ref Range: 4.0 - 10.5 K/uL 20.4 (H)  RBC Latest Ref Range: 3.87 - 5.11 MIL/uL 1.89 (L)  Hemoglobin Latest Ref Range: 12.0 - 15.0 g/dL 5.6 (LL)  HCT Latest Ref Range: 36.0 - 46.0 % 16.6 (L)  MCV Latest Ref Range: 80.0 - 100.0 fL 87.8  MCH Latest Ref Range: 26.0 - 34.0 pg 29.6  MCHC Latest Ref Range: 30.0 - 36.0 g/dL 37.8  RDW Latest Ref Range: 11.5 - 15.5 % 18.4 (H)  Platelets Latest Ref Range: 150 - 400 K/uL 165  nRBC Latest Ref Range: 0.0 - 0.2 % 0.0

## 2021-02-23 NOTE — Progress Notes (Signed)
IR   I spoke with Dr. Donavan Foil with OB this AM around 0620 regarding Ashley Camacho, who is a 30 y.o. female with a history of T1DM s/p C-section last Tuesday. Her post-operative course has been complicated by bleeding, recently having received pRBC x2 with an initial correction in her Hgb to 7.1, but not dropped back again to 5.9. She is getting another 1-2 pRBC now.   Recent CT on Oct 1 shows pelvic hematoma superior to the bladder, expected post gravid appearance of the uterus. Clinically, she is having no significant external bleeding. At this point, it is unclear the exact source of bleeding given lack of significant vaginal that would normally push [place] in the direction of doing an empiric [place] in these patients. Given that the uterus appears to be less likely the culprit in this case, we will pursue a triple phase CTA A/P to identify a bleeding vessel/source. Although she has a slight AKI, GFR remains >30 and should be safe to receive contrast. IR will follow up.    Recommendations:  Follow up results of CTA    Please call IR with any questions or changes.    Olive Bass, MD  [facility] 02/23/2021 6:51 AM

## 2021-02-23 NOTE — Care Management (Signed)
CM sent Charlesetta Shanks new referral for [facility] for patient.  She confirmed and will have her followed in the community after discharge.    Gretchen Short RNC-MNN, BSN Transitions of Care Pediatrics[facility]

## 2021-02-24 LAB — BPAM RBC
Blood Product Expiration Date: 202210272359
Blood Product Expiration Date: 202210302359
Blood Product Expiration Date: 202210302359
ISSUE DATE / TIME: 202210020738
ISSUE DATE / TIME: 202210030611
ISSUE DATE / TIME: 202210030911
Unit Type and Rh: 6200
Unit Type and Rh: 6200
Unit Type and Rh: 6200

## 2021-02-24 LAB — RENAL FUNCTION PANEL
Albumin: 1.9 g/dL — ABNORMAL LOW (ref 3.5–5.0)
Anion gap: 8 (ref 5–15)
BUN: 52 mg/dL — ABNORMAL HIGH (ref 6–20)
CO2: 24 mmol/L (ref 22–32)
Calcium: 8.1 mg/dL — ABNORMAL LOW (ref 8.9–10.3)
Chloride: 102 mmol/L (ref 98–111)
Creatinine, Ser: 1.47 mg/dL — ABNORMAL HIGH (ref 0.44–1.00)
GFR, Estimated: 49 mL/min — ABNORMAL LOW (ref >60)
Glucose, Bld: 199 mg/dL — ABNORMAL HIGH (ref 70–99)
Phosphorus: 4.4 mg/dL (ref 2.5–4.6)
Potassium: 5.4 mmol/L — ABNORMAL HIGH (ref 3.5–5.1)
Sodium: 134 mmol/L — ABNORMAL LOW (ref 135–145)

## 2021-02-24 LAB — TYPE AND SCREEN
ABO/RH(D): A POS
Antibody Screen: NEGATIVE
Unit division: 0
Unit division: 0
Unit division: 0

## 2021-02-24 LAB — CBC
HCT: 22 % — ABNORMAL LOW (ref 36.0–46.0)
Hemoglobin: 7.4 g/dL — ABNORMAL LOW (ref 12.0–15.0)
MCH: 29.5 pg (ref 26.0–34.0)
MCHC: 33.6 g/dL (ref 30.0–36.0)
MCV: 87.6 fL (ref 80.0–100.0)
Platelets: 186 10*3/uL (ref 150–400)
RBC: 2.51 MIL/uL — ABNORMAL LOW (ref 3.87–5.11)
RDW: 16.8 % — ABNORMAL HIGH (ref 11.5–15.5)
WBC: 18.3 10*3/uL — ABNORMAL HIGH (ref 4.0–10.5)
nRBC: 0 % (ref 0.0–0.2)

## 2021-02-24 LAB — MAGNESIUM: Magnesium: 2.5 mg/dL — ABNORMAL HIGH (ref 1.7–2.4)

## 2021-02-24 LAB — GLUCOSE, CAPILLARY
Glucose-Capillary: 275 mg/dL — ABNORMAL HIGH (ref 70–99)
Glucose-Capillary: 243 mg/dL — ABNORMAL HIGH (ref 70–99)

## 2021-02-24 MED ORDER — BISACODYL 10 MG RE SUPP
10.0000 mg | Freq: Once | RECTAL | Status: AC
  Administered 2021-02-24: 10 mg via RECTAL
  Filled 2021-02-24: qty 1

## 2021-02-24 MED ORDER — INSULIN ASPART 100 UNIT/ML IJ SOLN
3.0000 [IU] | Freq: Three times a day (TID) | INTRAMUSCULAR | Status: DC
  Administered 2021-02-24 – 2021-02-26 (×7): 3 [IU] via SUBCUTANEOUS

## 2021-02-24 MED ORDER — SENNOSIDES-DOCUSATE SODIUM 8.6-50 MG PO TABS
2.0000 | ORAL_TABLET | Freq: Every evening | ORAL | Status: DC | PRN
  Administered 2021-02-28 – 2021-03-01 (×2): 2 via ORAL
  Filled 2021-02-24 (×2): qty 2

## 2021-02-24 MED ORDER — HYDROCORTISONE 5 MG PO TABS
15.0000 mg | ORAL_TABLET | ORAL | Status: DC
  Administered 2021-02-24 – 2021-02-25 (×3): 15 mg via ORAL
  Filled 2021-02-24 (×3): qty 1

## 2021-02-24 MED ORDER — BISACODYL 10 MG RE SUPP
10.0000 mg | Freq: Every day | RECTAL | Status: DC | PRN
  Administered 2021-02-27: 10 mg via RECTAL
  Filled 2021-02-24 (×2): qty 1

## 2021-02-24 MED ORDER — INSULIN GLARGINE-YFGN 100 UNIT/ML ~~LOC~~ SOLN
4.0000 [IU] | Freq: Once | SUBCUTANEOUS | Status: AC
  Administered 2021-02-24: 4 [IU] via SUBCUTANEOUS
  Filled 2021-02-24: qty 0.04

## 2021-02-24 MED ORDER — HYDROCORTISONE 20 MG PO TABS
20.0000 mg | ORAL_TABLET | ORAL | Status: DC
  Administered 2021-02-25 – 2021-02-26 (×2): 20 mg via ORAL
  Filled 2021-02-24 (×2): qty 1

## 2021-02-24 MED ORDER — INSULIN ASPART 100 UNIT/ML IJ SOLN
3.0000 [IU] | Freq: Once | INTRAMUSCULAR | Status: AC
  Administered 2021-02-24: 3 [IU] via SUBCUTANEOUS

## 2021-02-24 MED ORDER — INSULIN GLARGINE-YFGN 100 UNIT/ML ~~LOC~~ SOLN
10.0000 [IU] | Freq: Every day | SUBCUTANEOUS | Status: DC
  Administered 2021-02-25: 10 [IU] via SUBCUTANEOUS
  Filled 2021-02-24: qty 0.1

## 2021-02-24 MED ORDER — FUROSEMIDE 10 MG/ML IJ SOLN
40.0000 mg | Freq: Two times a day (BID) | INTRAMUSCULAR | Status: DC
  Administered 2021-02-24 – 2021-02-26 (×4): 40 mg via INTRAVENOUS
  Filled 2021-02-24 (×5): qty 4

## 2021-02-24 MED ORDER — SODIUM ZIRCONIUM CYCLOSILICATE 10 G PO PACK
10.0000 g | PACK | Freq: Once | ORAL | Status: AC
  Administered 2021-02-24: 10 g via ORAL
  Filled 2021-02-24: qty 1

## 2021-02-24 NOTE — Progress Notes (Signed)
Daily Antepartum Note  Admission Date: 12/16/2020 Current Date: 02/24/2021 9:23 AM  Ashley Camacho Ashley Camacho is a 30 y.o. G2P0202 POD#7 s/p repeat low transverse c-section via pfannenstiela incision and bilateral salpingectomy@ [redacted]w[redacted]d admitted for multiple medical issues.  Post op course c/b suprapubic hematoma followed expectantly.  Pregnancy complicated by: Patient Active Problem List   Diagnosis Date Noted   Postoperative hematoma involving genitourinary system following genitourinary procedure    S/P cesarean section 02/17/2021   [redacted] weeks gestation of pregnancy    Acute esophagitis    Leg edema    History of preterm delivery, currently pregnant    Pre-existing type 1 diabetes mellitus during pregnancy in third trimester    Anemia during pregnancy    Type 1 diabetes mellitus affecting pregnancy in second trimester, antepartum    HFrEF (heart failure with reduced ejection fraction) (HCC)    Acute deep vein thrombosis (DVT) of brachial vein of right upper extremity ([place] 12/17/2020   Anxiety 12/17/2020   Diabetic retinopathy ([place] 12/17/2020   Acute on chronic combined systolic and diastolic CHF (congestive heart failure) ([place]    Hyperemesis 12/13/2020   DKA, type 1, not at goal ([facility] 12/13/2020   Sunburn 12/13/2020   Hypothyroidism 12/10/2020   Malnutrition ([place] 12/10/2020   Vitreous hemorrhage of right eye ([place] 12/10/2020   Pleural effusion associated with pulmonary infection 11/25/2020   Pneumonia affecting pregnancy 11/24/2020   Diabetes mellitus affecting pregnancy, second trimester 11/24/2020   Edema 11/24/2020   Decreased urine output 11/24/2020   Shortness of breath    Zinc deficiency    SOB (shortness of breath)    Social problem 11/21/2020   Nausea and vomiting during pregnancy prior to [redacted] weeks gestation 11/21/2020   Low serum vitamin B12    Delivery with history of C-section 11/20/2020   Absolute anemia    Acute kidney injury ([place]    Nausea and vomiting during  pregnancy 10/23/2020   History of premature delivery, currently pregnant, second trimester 10/13/2020   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   Objective:     Current Vital Signs 24h Vital Sign Ranges  T 97.8 F (36.6 C) Temp  Avg: 97.9 F (36.6 C)  Min: 97.7 F (36.5 C)  Max: 98 F (36.7 C)  BP 132/64 BP  Min: 111/64  Max: 147/78  HR 90 Pulse  Avg: 91.4  Min: 85  Max: 97  RR 17 Resp  Avg: 17  Min: 16  Max: 18  SaO2 96 % Room Air SpO2  Avg: 97.9 %  Min: 96 %  Max: 100 %       24 Hour I/O Current Shift I/O  Time Ins Outs 10/03 0701 - 10/04 0700 In: 13734[P.O.:485] Out: 1500 [Urine:1500] No intake/output data recorded.   Patient Vitals for the past 24 hrs:  BP Temp Temp src Pulse Resp SpO2  02/24/21 0751 132/64 97.8 F (36.6 C) Oral 90 17 96 %  02/24/21 0307 (!) 142/68 98 F (36.7 C) Oral 89 16 98 %  02/24/21 0020 137/80 97.8 F (36.6 C) Oral 85 18 97 %  02/23/21 2043 139/68 98 F (36.7 C) Oral 88 17 100 %  02/23/21 1643 (!) 147/78 97.7 F (36.5 C) Oral 97 -- 100 %  02/23/21 1155 -- -- -- -- -- 96 %  02/23/21 1154 111/64 97.8 F (36.6 C) Oral 97 -- 97 %  02/23/21 0943 127/61 -- -- 94 -- 99 %  02/23/21 0942 -- 98 F (36.7 C) Oral -- -- --    Physical exam: General: Well nourished, well developed female in no acute distress. Abdomen: incision c/d/I, +BS; stable indurated area above the skin to a few cm below the umbilicus which is likely the hematoma and it's mildly ttp.  Cardiovascular: S1, S2 normal, no murmur, rub or gallop, regular rate and rhythm Respiratory: CTAB Extremities: no clubbing, cyanosis or edema Skin: Warm  and dry.   Medications: Current Facility-Administered Medications  Medication Dose Route Frequency Provider Last Rate Last Admin   0.9 %  sodium chloride infusion (Manually program via Guardrails IV Fluids)   Intravenous Once Woodroe Mode, MD       acetaminophen (TYLENOL) tablet 1,000 mg  1,000 mg Oral Q6H PRN Aletha Halim, MD       alteplase (CATHFLO ACTIVASE) injection 2 mg  2 mg Intracatheter Once Mercy Riding, MD       Chlorhexidine Gluconate Cloth 2 % PADS 6 each  6 each Topical Daily Renard Matter, MD   6 each at 02/24/21 0917   cholecalciferol (VITAMIN D3) tablet 1,000 Units  1,000 Units Oral Daily Renard Matter, MD   1,000 Units at 02/24/21 0916   coconut oil  1 application Topical PRN Renard Matter, MD       witch hazel-glycerin (TUCKS) pad 1 application  1 application Topical PRN Renard Matter, MD       And   dibucaine (NUPERCAINAL) 1 % rectal ointment 1 application  1 application Rectal PRN Renard Matter, MD       diphenhydrAMINE (BENADRYL) capsule 25 mg  25 mg Oral Q6H PRN Renard Matter, MD       famotidine (PEPCID) tablet 20 mg  20 mg Oral BID Renard Matter, MD   20 mg at 02/24/21 0916   feeding supplement (GLUCERNA SHAKE) ([place]) liquid 237 mL  237 mL Oral BID BM Renard Matter, MD   237 mL at 02/24/21 0918   hydrocortisone (CORTEF) tablet 25 mg  25 mg Oral Q24H Renard Matter, MD   25 mg at 02/24/21 0354   And   hydrocortisone (CORTEF) tablet 15 mg  15 mg Oral Q24H Renard Matter, MD   15 mg at 02/23/21 1852   HYDROmorphone (DILAUDID) injection 1 mg  1 mg Intravenous Q2H PRN Radene Gunning, MD   1 mg at 02/24/21 0340   insulin aspart (novoLOG) injection 0-6 Units  0-6 Units Subcutaneous TID WC Wendee Beavers T, MD   2 Units at 02/23/21 1941   insulin aspart (novoLOG) injection 3 Units  3 Units Subcutaneous TID WC Gonfa, Taye T, MD       insulin glargine-yfgn (SEMGLEE) injection 6 Units  6 Units Subcutaneous Daily Wendee Beavers T, MD   6 Units at 02/24/21 0654   measles, mumps & rubella vaccine (MMR)  injection 0.5 mL  0.5 mL Subcutaneous Once Renard Matter, MD       menthol-cetylpyridinium (CEPACOL) lozenge 3 mg  1 lozenge Oral Q2H PRN Renard Matter, MD       metoCLOPramide (REGLAN) tablet 5 mg  5 mg Oral TID WC PRN Mercy Riding, MD       oxyCODONE (Oxy IR/ROXICODONE) immediate  release tablet 5-10 mg  5-10 mg Oral Q4H PRN Renard Matter, MD   10 mg at 02/23/21 2046   pantoprazole (PROTONIX) EC tablet 40 mg  40 mg Oral Daily Renard Matter, MD   40 mg at 02/24/21 0916   prenatal multivitamin tablet 1 tablet  1 tablet Oral Q1200 Renard Matter, MD   1 tablet at 02/23/21 1149   simethicone (MYLICON) chewable tablet 80 mg  80 mg Oral TID PC Renard Matter, MD   80 mg at 02/24/21 0916   simethicone (MYLICON) chewable tablet 80 mg  80 mg Oral PRN Renard Matter, MD   80 mg at 02/19/21 0147   sodium chloride (OCEAN) 0.65 % nasal spray 1 spray  1 spray Each Nare PRN Renard Matter, MD       sodium chloride flush (NS) 0.9 % injection 10-40 mL  10-40 mL Intracatheter Q12H Renard Matter, MD   10 mL at 02/24/21 0919   Tdap (BOOSTRIX) injection 0.5 mL  0.5 mL Intramuscular Once Renard Matter, MD       zolpidem [facility]) tablet 5 mg  5 mg Oral QHS PRN Renard Matter, MD        Labs:  Recent Labs  Lab 02/22/21 0015 02/23/21 0447 02/23/21 1600 02/24/21 0327  NA 135 132*  --  134*  K 4.9 5.4* 5.6* 5.4*  CL 105 101  --  102  CO2 24 24  --  24  BUN 52* 54*  --  52*  CREATININE 1.66* 1.66*  --  1.47*  CALCIUM 7.7* 7.8*  --  8.1*  PROT  --  4.4*  --   --   BILITOT  --  0.6  --   --   ALKPHOS  --  53  --   --   ALT  --  23  --   --   AST  --  19  --   --   GLUCOSE 180* 76  --  199*   Recent Labs  Lab 02/22/21 0015 02/22/21 0204 02/22/21 1332 02/23/21 0447 02/23/21 1600 02/24/21 0327  WBC 20.2*  --  23.3* 20.4* 22.3* 18.3*  HGB 5.9* 6.2* 7.1* 5.6* 8.4* 7.4*  HCT 17.6* 18.8* 20.6* 16.6* 24.5* 22.0*  PLT 158  --  178 165 197 186  MCV 90.7  --  86.6 87.8 86.0 87.6  MCH 30.4  --  29.8 29.6 29.5 29.5  MCHC 33.5  --  34.5 33.7 34.3  33.6  RDW 20.6*  --  18.3* 18.4* 16.6* 16.8*    Recent Labs  Lab 02/19/21 0530 02/20/21 0324 02/20/21 1007 02/21/21 0321 02/22/21 0015 02/23/21 0447 02/23/21 1600 02/24/21 0327  NA 136 137  --  138 135 132*  --  134*  K 4.3 4.7  --  4.3 4.9 5.4* 5.6* 5.4*  CL 104 105  --  108 105 101  --  102  CO2 25 25  --  26 24 24   --  24  GLUCOSE 120* 112*  --  98 180* 76  --  199*  BUN 30* 35*  --  45* 52* 54*  --  52*  CREATININE 1.18* 1.36*  --  1.38* 1.66* 1.66*  --  1.47*  CALCIUM 8.4* 8.0*  --  7.9* 7.7* 7.8*  --  8.1*  AST  --   --   --   --   --  19  --   --   ALT  --   --   --   --   --  23  --   --   ALKPHOS  --   --   --   --   --  53  --   --   BILITOT  --   --   --   --   --  0.6  --   --   ALBUMIN 2.3* 2.2*  --  2.1*  --  1.7*  --  1.9*  MG 2.4 2.2  --  2.2  --  2.2  --  2.5*  INR  --   --  0.9  --   --   --   --   --   HGBA1C  --   --   --  5.4  --   --   --   --   BNP  --   --   --  95.9  --  448.7*  --   --     Recent Labs  Lab 02/21/21 0321 02/23/21 0447  BNP 95.9 448.7*     Radiology  No new imaging   Assessment & Plan:  Patient stable *Postpartum/post op: a two point rise is what I would expect. Continue with exp management and follow up CBC in morning  I would hold off on anti-coagulation until labs are back tomorrow and if H/H stable then should be fine to restart anti-coagulation  Appreciate recs and care with Hospitalist team for her other multiple issues.   Durene Romans MD Attending Center for [place] (Faculty Practice) GYN Consult Phone: [phone] (M-F, [phone]) & [phone]  (Off hours, weekends, holidays)

## 2021-02-24 NOTE — Progress Notes (Signed)
PROGRESS NOTE  Ashley Camacho:[id number] DOB: 1990/09/26   PCP: Hildred Laser, MD  Patient is from: Home.  DOA: 12/16/2020 LOS: 70  Chief complaints:  No chief complaint on file.    Brief Narrative / Interim history: 30 y.o. G2P0101 with a history of T1DM, chronic HFrEF and recent hospitalization at [facility] on 6/30 for N/V and rhinovirus pneumonia requiring transfer to [facility].  She was discharged home from [facility] on 7/20 only to return on 7/22 with starvation ketoacidosis in the setting of nausea and vomiting likely due to uncontrolled DM-1 and gastroparesis, and brachial vein DVT.She was a started on TPN through PICC line and transferred to [facility] for cortrak and TF initiation.   Hospital course noteworthy of systolic CHF exacerbation (on IV diuretics), AI (p.o. Cortef), ABLA, hematemesis in the setting of grade C esophagitis on EGD, hematuria (seems to have resolved), moderate right hydronephrosis (improved), Enterococcus faecalis UTI (completed treatment), acute urinary retention (resolved)...  Patient underwent cesarean section on 9/27.  Had postop blood loss with large bladder flap hematoma measuring requiring blood transfusions.  CTA did not show active extravasation.  H&H seems to be stable now.  Subjective: Seen and examined earlier this morning.  No major events overnight of this morning.  Continues to endorse surgical site pain.  Also reports lower extremity edema with fluid oozing from her right thigh.  Denies chest pain, dyspnea, GI or UTI symptoms.  Objective: Vitals:   02/24/21 0020 02/24/21 0307 02/24/21 0751 02/24/21 1226  BP: 137/80 (!) 142/68 132/64 136/66  Pulse: 85 89 90 95  Resp: 18 16 17 18   Temp: 97.8 F (36.6 C) 98 F (36.7 C) 97.8 F (36.6 C) 97.9 F (36.6 C)  TempSrc: Oral Oral Oral Oral  SpO2: 97% 98% 96% 100%  Weight:      Height:        Intake/Output Summary (Last 24 hours) at 02/24/2021 1443 Last data filed at 02/24/2021 1200 Gross per 24 hour   Intake 725 ml  Output 1100 ml  Net -375 ml   Filed Weights   01/24/21 0603 01/26/21 0530 02/08/21 1157  Weight: 81.7 kg 80.6 kg 76.1 kg    Examination  GENERAL: No apparent distress.  Nontoxic. HEENT: MMM.  Vision and hearing grossly intact.  NECK: Supple.  No apparent JVD.  RESP: 100% on RA.  No IWOB.  Fair aeration bilaterally. CVS:  RRR. Heart sounds normal.  ABD/GI/GU: BS+. Abd soft.  Mild tenderness. MSK/EXT:  Moves extremities. No apparent deformity.  2+ edema up to her waist. SKIN: no apparent skin lesion or wound NEURO: Awake and alert. Oriented appropriately.  No apparent focal neuro deficit. PSYCH: Calm. Normal affect.   Procedures:  8/24-EGD with grade C esophagitis. 9/27-cesarean section  Microbiology summarized: 7/30-wet prep negative. 7/30-urine culture with insignificant growth. 8/15-urine culture with pansensitive Enterococcus faecalis 8/22-C. difficile negative 9/6 & 7-urine culture with multiple species 9/9-urine culture negative  Assessment & Plan: Volume overload/acute systolic CHF with improved EF-2+ edema to her waist bilaterally.  Lasix was on hold due to hypotension, AKI and bleeding.  1.5 L UOP with net negative/24 hours.  Net -22 L since admission but not sure if this is accurate.   No cardiopulmonary symptoms.  Creatinine and BNP improved. -Resume IV Lasix 40 mg twice daily -Closely monitor fluid status, renal functions and electrolytes -Cardiology signed off.  Postsurgical ABLA/hematoma: H&H seems to be stable after multiple blood transfusions.  CTA did not show active extravasation.  Recent Labs  02/20/21 0324 02/20/21 1557 02/21/21 0321 02/21/21 1010 02/22/21 0015 02/22/21 0204 02/22/21 1332 02/23/21 0447 02/23/21 1600 02/24/21 0327  HGB 8.5* 8.2* 6.2* 6.3* 5.9* 6.2* 7.1* 5.6* 8.4* 7.4*  -Monitor H&H -Defer further care to primary team.  AKI/azotemia: Improving. Recent Labs    02/15/21 0500 02/16/21 0319 02/17/21 7026  02/18/21 0803 02/19/21 0530 02/20/21 0324 02/21/21 0321 02/22/21 0015 02/23/21 0447 02/24/21 0327  BUN 32* 37* 36* 33* 30* 35* 45* 52* 54* 52*  CREATININE 1.60* 1.70* 1.49* 1.22* 1.18* 1.36* 1.38* 1.66* 1.66* 1.47*  -Closely monitor renal function -Renal US and urology consult if renal function gets worse  Adrenal insufficiency: low cortisol level with minimal to no response to ACTH suggests primary AI.  ACTH level low but drawn after she started stress dose steroid.  -Wean morning Cortef from 25 to 20 mg.  Continue evening dose of 15 mg -Consider weaning evening dose to 10 mg in about a week -Outpatient follow-up with endocrinology after discharge   Gross hematuria, unclear etiology:  Renal US with hydronephrosis.  Hematuria resolved.  Acute urinary tension/bilateral hydronephrosis-moderate right and mild left.  Urinary tension resolved.  Controlled DM-1 with with hypo and hyperglycemia: A1c 5.8% on 7/1.  Recent Labs  Lab 02/21/21 0747 02/21/21 1006 02/21/21 1038 02/23/21 2242 02/24/21 0625  GLUCAP 74 67* 160* 227* 275*  -Agree with increasing Lantus to 10 units daily -Continue CBG monitoring and every 4 SSI-very sensitive -Continue NovoLog 3 units 3 times daily with meals -Mealtime coverage and Lantus to be held if CBG less than 100 or she doesn't finish 50% of her meals -Appreciate help by diabetic coordinator who is coordinating insulin pump with outpatient endocrinologist  Right brachial vein DVT: Dx 12/07/20: seems to be recannulating based on repeat U/S.  -Holding Lovenox in the setting of bleeding and hematoma  Essential hypertension: Normotensive for most part. -Continue holding Pradaxa and hydralazine-could also make edema worse. -IV Lasix as above  Mild hyperkalemia: K5.4.  Due to AKI?   -Lokelma 10 g x 1 -Recheck in the morning  Chronic insomnia -On Unisom 12.5 mg twice daily  Hyponatremia: Likely hypervolemic/dilutional. -Diuretics as  above  Intractable nausea/vomiting-resolved. Elevated liver enzymes: Resolved. Acute thrombocytopenia: Resolved Epistaxis: Resolved Hematemesis: Resolved. Enterococcus faecalis UTI-resolved Hypokalemia/hypomagnesemia: Resolved.  Third trimester intrauterine pregnancy: s/p C-section delivery.   Limited venous access:  -New PICC line on 02/21/2021  Leukocytosis/bandemia: Likely demargination versus infection.  Improving. -Recheck in the morning  Inadequate oral intake Body mass index is 31.71 kg/m. Nutrition Problem: Inadequate oral intake Etiology: nausea, vomiting Signs/Symptoms: NPO status Interventions: TPN, Tube feeding   DVT prophylaxis:  SCD's Start: 02/17/21 1953 Place TED hose Start: 01/17/21 1655 Patient is on heparin drip for DVT.  Code Status: Full code Family Communication: Patient and/or RN.  No family member at bedside.  Level of care: Postpartum Status is: Inpatient   Dispo: Per primary   Sch Meds:  Scheduled Meds:  sodium chloride   Intravenous Once   alteplase  2 mg Intracatheter Once   Chlorhexidine Gluconate Cloth  6 each Topical Daily   cholecalciferol  1,000 Units Oral Daily   famotidine  20 mg Oral BID   feeding supplement (GLUCERNA SHAKE)  237 mL Oral BID BM   furosemide  40 mg Intravenous Q12H   hydrocortisone  25 mg Oral Q24H   And   hydrocortisone  15 mg Oral Q24H   insulin aspart  0-6 Units Subcutaneous TID WC   insulin aspart  3  Units Subcutaneous TID WC   [START ON 02/25/2021] insulin glargine-yfgn  10 Units Subcutaneous Daily   measles, mumps & rubella vaccine  0.5 mL Subcutaneous Once   pantoprazole  40 mg Oral Daily   prenatal multivitamin  1 tablet Oral Q1200   simethicone  80 mg Oral TID PC   sodium chloride flush  10-40 mL Intracatheter Q12H   Tdap  0.5 mL Intramuscular Once   Continuous Infusions:   PRN Meds:.acetaminophen, coconut oil, witch hazel-glycerin **AND** dibucaine, diphenhydrAMINE, HYDROmorphone (DILAUDID)  injection, menthol-cetylpyridinium, metoCLOPramide, oxyCODONE, simethicone, sodium chloride, zolpidem  Antimicrobials: Anti-infectives (From admission, onward)    Start     Dose/Rate Route Frequency Ordered Stop   01/30/21 1315  amoxicillin (AMOXIL) capsule 500 mg  Status:  Discontinued        500 mg Oral 2 times daily 01/30/21 1218 02/01/21 1426   01/30/21 1200  ampicillin (OMNIPEN) 2 g in sodium chloride 0.9 % 100 mL IVPB  Status:  Discontinued        2 g 300 mL/hr over 20 Minutes Intravenous Every 6 hours 01/30/21 1105 01/30/21 1218   01/06/21 1630  amoxicillin (AMOXIL) 250 MG/5ML suspension 500 mg        500 mg Per Tube Every 8 hours 01/06/21 1534 01/13/21 0602   01/06/21 1615  amoxicillin (AMOXIL) chewable tablet 500 mg  Status:  Discontinued        500 mg Oral Every 8 hours 01/06/21 1528 01/06/21 1532        I have personally reviewed the following labs and images: CBC: Recent Labs  Lab 02/22/21 0015 02/22/21 0204 02/22/21 1332 02/23/21 0447 02/23/21 1600 02/24/21 0327  WBC 20.2*  --  23.3* 20.4* 22.3* 18.3*  HGB 5.9* 6.2* 7.1* 5.6* 8.4* 7.4*  HCT 17.6* 18.8* 20.6* 16.6* 24.5* 22.0*  MCV 90.7  --  86.6 87.8 86.0 87.6  PLT 158  --  178 165 197 186   BMP &GFR Recent Labs  Lab 02/19/21 0530 02/20/21 0324 02/21/21 0321 02/22/21 0015 02/23/21 0447 02/23/21 1600 02/24/21 0327  NA 136 137 138 135 132*  --  134*  K 4.3 4.7 4.3 4.9 5.4* 5.6* 5.4*  CL 104 105 108 105 101  --  102  CO2 25 25 26 24 24   --  24  GLUCOSE 120* 112* 98 180* 76  --  199*  BUN 30* 35* 45* 52* 54*  --  52*  CREATININE 1.18* 1.36* 1.38* 1.66* 1.66*  --  1.47*  CALCIUM 8.4* 8.0* 7.9* 7.7* 7.8*  --  8.1*  MG 2.4 2.2 2.2  --  2.2  --  2.5*  PHOS 5.0* 4.5 4.5  --  4.9*  --  4.4   Estimated Creatinine Clearance: 52.7 mL/min (A) (by C-G formula based on SCr of 1.47 mg/dL (H)). Liver & Pancreas: Recent Labs  Lab 02/19/21 0530 02/20/21 0324 02/21/21 0321 02/23/21 0447 02/24/21 0327  AST  --    --   --  19  --   ALT  --   --   --  23  --   ALKPHOS  --   --   --  53  --   BILITOT  --   --   --  0.6  --   PROT  --   --   --  4.4*  --   ALBUMIN 2.3* 2.2* 2.1* 1.7* 1.9*   No results for input(s): LIPASE, AMYLASE in the last 168 hours. No results for  input(s): AMMONIA in the last 168 hours. Diabetic: No results for input(s): HGBA1C in the last 72 hours.  Recent Labs  Lab 02/21/21 0747 02/21/21 1006 02/21/21 1038 02/23/21 2242 02/24/21 0625  GLUCAP 74 67* 160* 227* 275*   Cardiac Enzymes: No results for input(s): CKTOTAL, CKMB, CKMBINDEX, TROPONINI in the last 168 hours. No results for input(s): PROBNP in the last 8760 hours. Coagulation Profile: Recent Labs  Lab 02/20/21 1007  INR 0.9   Thyroid Function Tests: No results for input(s): TSH, T4TOTAL, FREET4, T3FREE, THYROIDAB in the last 72 hours. Lipid Profile: No results for input(s): CHOL, HDL, LDLCALC, TRIG, CHOLHDL, LDLDIRECT in the last 72 hours. Anemia Panel: No results for input(s): VITAMINB12, FOLATE, FERRITIN, TIBC, IRON, RETICCTPCT in the last 72 hours.  Urine analysis:    Component Value Date/Time   COLORURINE AMBER (A) 01/10/2021 0131   APPEARANCEUR CLOUDY (A) 01/10/2021 0131   APPEARANCEUR Clear 09/18/2020 1153   LABSPEC 1.017 01/10/2021 0131   LABSPEC 1.026 03/01/2013 1844   PHURINE 5.0 01/10/2021 0131   GLUCOSEU NEGATIVE 01/10/2021 0131   GLUCOSEU >=500 03/01/2013 1844   HGBUR NEGATIVE 01/10/2021 0131   BILIRUBINUR NEGATIVE 01/10/2021 0131   BILIRUBINUR neg 10/23/2020 0953   BILIRUBINUR Negative 09/18/2020 1153   BILIRUBINUR Negative 03/01/2013 1844   KETONESUR NEGATIVE 01/10/2021 0131   PROTEINUR NEGATIVE 01/10/2021 0131   UROBILINOGEN 0.2 10/23/2020 0953   NITRITE NEGATIVE 01/10/2021 0131   LEUKOCYTESUR SMALL (A) 01/10/2021 0131   LEUKOCYTESUR Negative 03/01/2013 1844   Sepsis Labs: Invalid input(s): PROCALCITONIN, LACTICIDVEN  Microbiology: Recent Results (from the past 240  hour(s))  Resp Panel by RT-PCR (Flu A&B, Covid) Nasopharyngeal Swab     Status: None   Collection Time: 02/17/21  6:27 AM   Specimen: Nasopharyngeal Swab; Nasopharyngeal(NP) swabs in vial transport medium  Result Value Ref Range Status   SARS Coronavirus 2 by RT PCR NEGATIVE NEGATIVE Final    Comment: (NOTE) SARS-CoV-2 target nucleic acids are NOT DETECTED.  The SARS-CoV-2 RNA is generally detectable in upper respiratory specimens during the acute phase of infection. The lowest concentration of SARS-CoV-2 viral copies this assay can detect is 138 copies/mL. A negative result does not preclude SARS-Cov-2 infection and should not be used as the sole basis for treatment or other patient management decisions. A negative result may occur with  improper specimen collection/handling, submission of specimen other than nasopharyngeal swab, presence of viral mutation(s) within the areas targeted by this assay, and inadequate number of viral copies(<138 copies/mL). A negative result must be combined with clinical observations, patient history, and epidemiological information. The expected result is Negative.  Fact Sheet for Patients:  BloggerCourse.com  Fact Sheet for Healthcare Providers:  SeriousBroker.it  This test is no t yet approved or cleared by the [place] FDA and  has been authorized for detection and/or diagnosis of SARS-CoV-2 by FDA under an Emergency Use Authorization (EUA). This EUA will remain  in effect (meaning this test can be used) for the duration of the COVID-19 declaration under Section 564(b)(1) of the Act, 21 U.S.C.section 360bbb-3(b)(1), unless the authorization is terminated  or revoked sooner.       Influenza A by PCR NEGATIVE NEGATIVE Final   Influenza B by PCR NEGATIVE NEGATIVE Final    Comment: (NOTE) The Xpert Xpress SARS-CoV-2/FLU/RSV plus assay is intended as an aid in the diagnosis of influenza from  Nasopharyngeal swab specimens and should not be used as a sole basis for treatment. Nasal washings and aspirates are unacceptable  for Xpert Xpress SARS-CoV-2/FLU/RSV testing.  Fact Sheet for Patients: BloggerCourse.com  Fact Sheet for Healthcare Providers: SeriousBroker.it  This test is not yet approved or cleared by the [place] FDA and has been authorized for detection and/or diagnosis of SARS-CoV-2 by FDA under an Emergency Use Authorization (EUA). This EUA will remain in effect (meaning this test can be used) for the duration of the COVID-19 declaration under Section 564(b)(1) of the Act, 21 U.S.C. section 360bbb-3(b)(1), unless the authorization is terminated or revoked.  Performed at [facility], [address] [place], [place] [address]     Radiology Studies: No results found.   Ulyess Muto T. Rahkim Rabalais [facility]  If 7PM-7AM, please contact night-coverage [email or web address] 02/24/2021, 2:43 PM

## 2021-02-24 NOTE — Progress Notes (Addendum)
Inpatient Diabetes Program Recommendations  AACE/ADA: New Consensus Statement on Inpatient Glycemic Control (2015)  Target Ranges:  Prepandial:   less than 140 mg/dL      Peak postprandial:   less than 180 mg/dL (1-2 hours)      Critically ill patients:  140 - 180 mg/dL   Lab Results  Component Value Date   GLUCAP 275 (H) 02/24/2021   HGBA1C 5.4 02/21/2021    Review of Glycemic Control Results for JACQULYNN, SHAPPELL (MRN [id number]) as of 02/24/2021 09:19  Ref. Range 02/21/2021 10:38 02/23/2021 22:42 02/24/2021 06:25  Glucose-Capillary Latest Ref Range: 70 - 99 mg/dL 902 (H) 409 (H) 735 (H)  Diabetes history: DM 1 Outpatient Diabetes medications: Omnipod with Novolog insulin  Most recent insulin pump rates from [facility]: 0.05 units/hr= 1.2 units in 24 hours, 1 units drops blood sugar 100 mg/dL, 1 units per 30 grams of CHO   In DM coordinator note from 11/20/2020: "Current pump settings are: 12-6am  0.15 units/hour 6am-12am 0.25 units/hour Total 5.4 units/24 hours  CF 80 (1 unit drops glucose 80 mg/dl) I:C 3:29 (1 unit covers 20 grams of carbs) Target BG 100 mg/dl"   Current orders for Inpatient glycemic control:  Semglee 6 units QD, Novolog 0-6 units TID & HS, Novolog 3 units TID Solucortef 25 mg QAM, 15 mg QPM     If steroids to continue at maintenance does Consider: -One time dose of Semglee 4 units (to start now) -Semglee 10 units QD (to start 10/5)  Patient has been elevated >200s mg/dL due to missed dose of QHS NPH on 10/3. Noted hold parameters on Semglee; Would recommend not holding dose because of lack of intake as patient is type 1. Secure chat sent to MD.  Sherron Monday with patient at bedside. Encouraged patient to have boyfriend bring pump supplies to hospital when he returns. Patient feels she is "running higher than usual". Patient does not know what pump supplies she needs. Agreed to help assist and coordinate. TOC pharmacy called. Reached out to Dr Darrol Jump office, outpatient  endocrinologist for orders on insulin pump supplies.  Freestyle Libre reapplied to patient's left forearm. RN at bedside and encouraged to give sliding scale insulin and meal coverage insulin as meal tray arrived.   Thanks, Lujean Rave, MSN, RNC-OB Diabetes Coordinator [phone] (8a-5p)

## 2021-02-24 NOTE — Lactation Note (Signed)
This note was copied from a baby's chart. Lactation Consultation Note  Patient Name: Ashley Camacho [id number] Date: 02/24/2021 Reason for consult: Follow-up assessment;Late-preterm 34-36.6wks;Maternal endocrine disorder;NICU baby Age:30 days  Visited with mom of 65 57/25 weeks old (adjusted) NICU female, she now has a central line (PICC line) on her left arm and hasn't been pumping consistently due to pain.  Provided emotional support to mom and explained that consistent pumping is necessary for the full onset of lactogenesis II; and also the prevention of engorgement, which will mostly aggravate her existing pain.  Assisted mom with washing her pump parts, she wasn't ready to pump yet at the time of [facility] consultation, but let her know what they're ready for the next use.   Assisted mom with breast massage and hand expression; noticed that mom's breast are still soft with a few milk nodes they're filling in but no s/s of engorgement as today.  Plan of care:  Encouraged mom to try to pump as much as she can, ideally 8 times/24 hours but she'll go at her own pace as she recovers Breast massage and and expression were also encouraged prior pumping  No other support person at this time. All questions and concerns answered, mom to call NICU LC PRN.  Maternal Data   Mom's supply is BNL, probably due to risk factors (diabetes, wide spaced breast and infrequent pumping).  Feeding Mother's Current Feeding Choice: Breast Milk and Donor Milk Nipple Type: Dr. Levert Feinstein Preemie  Lactation Tools Discussed/Used Tools: Pump Breast pump type: Double-Electric Breast Pump Pump Education: Setup, frequency, and cleaning;Milk Storage Reason for Pumping: LPI in NICU Pumping frequency: 2 times/24 hours; mom voice she can't keep up with recommended pumping schedule due to pain Pumped volume: 5 mL (5-6)  Interventions Interventions: DEBP;Education;Breast massage;Hand express  Discharge Discharge  Education: Engorgement and breast care Pump: DEBP;Personal  Consult Status Consult Status: Follow-up Date: 02/24/21 Follow-up type: In-patient   Ashley Camacho 02/24/2021, 2:36 PM

## 2021-02-25 ENCOUNTER — Telehealth (HOSPITAL_COMMUNITY): Payer: Self-pay | Admitting: *Deleted

## 2021-02-25 LAB — MAGNESIUM: Magnesium: 2.3 mg/dL (ref 1.7–2.4)

## 2021-02-25 LAB — RENAL FUNCTION PANEL
Albumin: 2 g/dL — ABNORMAL LOW (ref 3.5–5.0)
Anion gap: 9 (ref 5–15)
BUN: 57 mg/dL — ABNORMAL HIGH (ref 6–20)
CO2: 27 mmol/L (ref 22–32)
Calcium: 8.6 mg/dL — ABNORMAL LOW (ref 8.9–10.3)
Chloride: 99 mmol/L (ref 98–111)
Creatinine, Ser: 1.46 mg/dL — ABNORMAL HIGH (ref 0.44–1.00)
GFR, Estimated: 50 mL/min — ABNORMAL LOW (ref >60)
Glucose, Bld: 238 mg/dL — ABNORMAL HIGH (ref 70–99)
Phosphorus: 4.1 mg/dL (ref 2.5–4.6)
Potassium: 4.9 mmol/L (ref 3.5–5.1)
Sodium: 135 mmol/L (ref 135–145)

## 2021-02-25 LAB — CBC
HCT: 22.4 % — ABNORMAL LOW (ref 36.0–46.0)
Hemoglobin: 7.5 g/dL — ABNORMAL LOW (ref 12.0–15.0)
MCH: 29.8 pg (ref 26.0–34.0)
MCHC: 33.5 g/dL (ref 30.0–36.0)
MCV: 88.9 fL (ref 80.0–100.0)
Platelets: 235 10*3/uL (ref 150–400)
RBC: 2.52 MIL/uL — ABNORMAL LOW (ref 3.87–5.11)
RDW: 16.5 % — ABNORMAL HIGH (ref 11.5–15.5)
WBC: 14.2 10*3/uL — ABNORMAL HIGH (ref 4.0–10.5)
nRBC: 0 % (ref 0.0–0.2)

## 2021-02-25 LAB — GLUCOSE, CAPILLARY: Glucose-Capillary: 185 mg/dL — ABNORMAL HIGH (ref 70–99)

## 2021-02-25 LAB — BRAIN NATRIURETIC PEPTIDE: B Natriuretic Peptide: 1024.1 pg/mL — ABNORMAL HIGH (ref 0.0–100.0)

## 2021-02-25 MED ORDER — INSULIN ASPART 100 UNIT/ML IJ SOLN
4.0000 [IU] | Freq: Once | INTRAMUSCULAR | Status: AC
  Administered 2021-02-25: 4 [IU] via SUBCUTANEOUS

## 2021-02-25 MED ORDER — INSULIN ASPART 100 UNIT/ML IJ SOLN
0.0000 [IU] | INTRAMUSCULAR | Status: DC
  Administered 2021-02-25: 1 [IU] via SUBCUTANEOUS
  Administered 2021-02-25: 2 [IU] via SUBCUTANEOUS
  Administered 2021-02-26 (×2): 1 [IU] via SUBCUTANEOUS
  Administered 2021-02-26: 3 [IU] via SUBCUTANEOUS
  Administered 2021-02-26: 2 [IU] via SUBCUTANEOUS
  Administered 2021-02-26 – 2021-02-27 (×3): 1 [IU] via SUBCUTANEOUS
  Administered 2021-02-27: 3 [IU] via SUBCUTANEOUS
  Administered 2021-02-27: 4 [IU] via SUBCUTANEOUS
  Administered 2021-02-27: 3 [IU] via SUBCUTANEOUS

## 2021-02-25 MED ORDER — INSULIN GLARGINE-YFGN 100 UNIT/ML ~~LOC~~ SOLN
12.0000 [IU] | Freq: Every day | SUBCUTANEOUS | Status: DC
  Administered 2021-02-25 – 2021-02-26 (×2): 12 [IU] via SUBCUTANEOUS
  Filled 2021-02-25 (×2): qty 0.12

## 2021-02-25 NOTE — Telephone Encounter (Signed)
No call due to current admission in hospital.  Duffy Rhody, RN 02-25-2021 at 3:17pm

## 2021-02-25 NOTE — Progress Notes (Addendum)
Inpatient Diabetes Program Recommendations  AACE/ADA: New Consensus Statement on Inpatient Glycemic Control (2015)  Target Ranges:  Prepandial:   less than 140 mg/dL      Peak postprandial:   less than 180 mg/dL (1-2 hours)      Critically ill patients:  140 - 180 mg/dL   Lab Results  Component Value Date   GLUCAP 185 (H) 02/25/2021   HGBA1C 5.4 02/21/2021    Review of Glycemic Control Results for Ashley Camacho, Ashley Camacho (MRN [id number]) as of 02/25/2021 09:36  Ref. Range 02/24/2021 06:25 02/24/2021 20:57 02/25/2021 01:19  Glucose-Capillary Latest Ref Range: 70 - 99 mg/dL 570 (H) 177 (H) 939 (H)   Diabetes history: DM 1 Outpatient Diabetes medications: Omnipod with Novolog insulin  Most recent insulin pump rates from [facility]: 0.05 units/hr= 1.2 units in 24 hours, 1 units drops blood sugar 100 mg/dL, 1 units per 30 grams of CHO   In DM coordinator note from 11/20/2020: "Current pump settings are: 12-6am  0.15 units/hour 6am-12am 0.25 units/hour Total 5.4 units/24 hours  CF 80 (1 unit drops glucose 80 mg/dl) I:C 0:30 (1 unit covers 20 grams of carbs) Target BG 100 mg/dl"   Current orders for Inpatient glycemic control:  Semglee 10 units QD, Novolog 0-6 units TID & HS, Novolog 3 units TID, Novolog 4 units x 1 Solucortef 25 mg QAM, 15 mg QPM     If steroids to continue at maintenance does Consider: -Semglee 12 units QD -Novolog 0-6 units q4H  Have called Dr Leeroy Cha office again regarding insulin pump supplies. Awaiting call back. Secure chat sent to Md with recs and discussion with RN regarding correction.  Addendum: Spoke with Nehemiah Settle, RN through Dr Darrol Jump office, outpatient endocrinology. Working on transitioning to Tandem pump. Coordinating with Thayer Ohm, pump trainer to help assist with face time teaching and sending supplies.   Thanks, Lujean Rave, MSN, RNC-OB Diabetes Coordinator [phone] (8a-5p)

## 2021-02-25 NOTE — Progress Notes (Signed)
Daily Obstetrics Note  Admission Date: 12/16/2020 Current Date: 02/25/2021 10:42 AM  Ashley Camacho is a 30 y.o. G2P0202 POD#8 s/p repeat low transverse c-section via pfannenstiela incision and bilateral salpingectomy@ [redacted]w[redacted]d admitted for multiple medical issues.  Post op course c/b suprapubic hematoma followed expectantly.  Pregnancy complicated by: Patient Active Problem List   Diagnosis Date Noted   Postoperative hematoma involving genitourinary system following genitourinary procedure    S/P cesarean section 02/17/2021   Acute esophagitis    Leg edema    History of preterm delivery, currently pregnant    Pre-existing type 1 diabetes mellitus during pregnancy in third trimester    Anemia during pregnancy    Type 1 diabetes mellitus affecting pregnancy in second trimester, antepartum    HFrEF (heart failure with reduced ejection fraction) (HCC)    Acute deep vein thrombosis (DVT) of brachial vein of right upper extremity ([facility] 12/17/2020   Anxiety 12/17/2020   Diabetic retinopathy ([facility] 12/17/2020   Acute on chronic combined systolic and diastolic CHF (congestive heart failure) ([facility]    Hyperemesis 12/13/2020   DKA, type 1, not at goal ([facility] 12/13/2020   Sunburn 12/13/2020   Hypothyroidism 12/10/2020   Malnutrition ([facility] 12/10/2020   Vitreous hemorrhage of right eye ([facility] 12/10/2020   Pleural effusion associated with pulmonary infection 11/25/2020   Pneumonia affecting pregnancy 11/24/2020   Diabetes mellitus affecting pregnancy, second trimester 11/24/2020   Edema 11/24/2020   Decreased urine output 11/24/2020   Shortness of breath    Zinc deficiency    SOB (shortness of breath)    Social problem 11/21/2020   Nausea and vomiting during pregnancy prior to [redacted] weeks gestation 11/21/2020   Low serum vitamin B12    Delivery with history of C-section 11/20/2020   Absolute anemia    Acute kidney injury ([facility]    Nausea and vomiting during pregnancy 10/23/2020   History of  premature delivery, currently pregnant, second trimester 10/13/2020   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 Still meeting post op goals  Objective:     Current Vital Signs 24h Vital Sign Ranges  T 98.2 F (36.8 C) Temp  Avg: 98.1 F (36.7 C)  Min: 97.9 F (36.6 C)  Max: 98.4 F (36.9 C)  BP 135/60 BP  Min: 135/60  Max: 148/72  HR 89 Pulse  Avg: 90.7  Min: 87  Max: 95  RR 16 Resp  Avg: 16.8  Min: 16  Max: 18  SaO2 98 % Room Air SpO2  Avg: 98.1 %  Min: 96 %  Max: 100 %       24 Hour I/O Current Shift I/O  Time Ins Outs 10/04 0701 - 10/05 0700 In: 290 [P.O.:290] Out: 2225 [Urine:2225] No intake/output data recorded.   Patient Vitals for the past 24 hrs:  BP Temp Temp src Pulse Resp SpO2  02/25/21 0817 135/60 98.2 F (36.8 C) Oral 89 16 98 %  02/25/21 0431 (!) 148/72 98 F (36.7 C) Oral 89 17 100 %  02/24/21 2347 (!) 145/66 98 F (36.7 C) Oral 87 16 96 %  02/24/21 2345 -- -- -- -- -- 96 %  02/24/21 1953 (!) 148/72 98.4 F (36.9 C) Oral 90 16 99 %  02/24/21 1523 (!)  144/63 98 F (36.7 C) Oral 94 18 98 %  02/24/21 1226 136/66 97.9 F (36.6 C) Oral 95 18 100 %     Physical exam: General: Well nourished, well developed female in no acute distress. Abdomen: incision c/d/I, +BS; stable indurated area above the skin to a few cm below the umbilicus that is starting to bruise. Incision c/d/I with some old blood that is dried and out of the incision which is likely the hematoma coming through. No e/o separation, infection or breakdown of the incision.  Cardiovascular: S1, S2 normal, no murmur, rub or gallop, regular rate and rhythm Respiratory: CTAB Extremities: no clubbing, cyanosis or edema Skin:  Warm and dry.   Medications: Current Facility-Administered Medications  Medication Dose Route Frequency Provider Last Rate Last Admin   0.9 %  sodium chloride infusion (Manually program via Guardrails IV Fluids)   Intravenous Once Woodroe Mode, MD       acetaminophen (TYLENOL) tablet 1,000 mg  1,000 mg Oral Q6H PRN Aletha Halim, MD       alteplase (CATHFLO ACTIVASE) injection 2 mg  2 mg Intracatheter Once Mercy Riding, MD       bisacodyl (DULCOLAX) suppository 10 mg  10 mg Rectal Daily PRN Aletha Halim, MD       Chlorhexidine Gluconate Cloth 2 % PADS 6 each  6 each Topical Daily Renard Matter, MD   6 each at 02/25/21 1012   cholecalciferol (VITAMIN D3) tablet 1,000 Units  1,000 Units Oral Daily Renard Matter, MD   1,000 Units at 02/25/21 1012   coconut oil  1 application Topical PRN Renard Matter, MD       witch hazel-glycerin (TUCKS) pad 1 application  1 application Topical PRN Renard Matter, MD       And   dibucaine (NUPERCAINAL) 1 % rectal ointment 1 application  1 application Rectal PRN Renard Matter, MD       diphenhydrAMINE (BENADRYL) capsule 25 mg  25 mg Oral Q6H PRN Renard Matter, MD       famotidine (PEPCID) tablet 20 mg  20 mg Oral BID Renard Matter, MD   20 mg at 02/25/21 1013   feeding supplement (GLUCERNA SHAKE) (GLUCERNA SHAKE) liquid 237 mL  237 mL Oral BID BM Renard Matter, MD   237 mL at 02/25/21 1007   furosemide (LASIX) injection 40 mg  40 mg Intravenous Q12H Wendee Beavers T, MD   40 mg at 02/25/21 0127   hydrocortisone (CORTEF) tablet 20 mg  20 mg Oral Q24H Gonfa, Taye T, MD   20 mg at 02/25/21 1020   And   hydrocortisone (CORTEF) tablet 15 mg  15 mg Oral Q24H Gonfa, Taye T, MD   15 mg at 02/25/21 1009   HYDROmorphone (DILAUDID) injection 1 mg  1 mg Intravenous Q2H PRN Radene Gunning, MD   1 mg at 02/24/21 2136   insulin aspart (novoLOG) injection 0-6 Units  0-6 Units Subcutaneous Q4H Hosie Poisson, MD       insulin aspart (novoLOG) injection 3 Units  3 Units Subcutaneous TID WC Gonfa,  Taye T, MD   3 Units at 02/25/21 1022   [START ON 02/26/2021] insulin glargine-yfgn (SEMGLEE) injection 12 Units  12 Units Subcutaneous Daily Hosie Poisson, MD       measles, mumps & rubella vaccine (MMR) injection 0.5 mL  0.5 mL Subcutaneous Once Renard Matter, MD       menthol-cetylpyridinium (CEPACOL) lozenge 3 mg  1 lozenge Oral Q2H PRN Cy Blamer,  Lew Dawes, MD       metoCLOPramide (REGLAN) tablet 5 mg  5 mg Oral TID WC PRN Wendee Beavers T, MD       oxyCODONE (Oxy IR/ROXICODONE) immediate release tablet 5-10 mg  5-10 mg Oral Q4H PRN Renard Matter, MD   10 mg at 02/25/21 0427   pantoprazole (PROTONIX) EC tablet 40 mg  40 mg Oral Daily Renard Matter, MD   40 mg at 02/25/21 1012   prenatal multivitamin tablet 1 tablet  1 tablet Oral Q1200 Renard Matter, MD   1 tablet at 02/24/21 1159   senna-docusate (Senokot-S) tablet 2 tablet  2 tablet Oral QHS PRN Aletha Halim, MD       simethicone (MYLICON) chewable tablet 80 mg  80 mg Oral TID PC Renard Matter, MD   80 mg at 02/25/21 1012   simethicone (MYLICON) chewable tablet 80 mg  80 mg Oral PRN Renard Matter, MD   80 mg at 02/19/21 0147   sodium chloride (OCEAN) 0.65 % nasal spray 1 spray  1 spray Each Nare PRN Renard Matter, MD       sodium chloride flush (NS) 0.9 % injection 10-40 mL  10-40 mL Intracatheter Q12H Renard Matter, MD   10 mL at 02/24/21 2227   Tdap (BOOSTRIX) injection 0.5 mL  0.5 mL Intramuscular Once Renard Matter, MD       zolpidem [facility]) tablet 5 mg  5 mg Oral QHS PRN Renard Matter, MD        Labs:  Recent Labs  Lab 02/23/21 0447 02/23/21 1600 02/24/21 0327 02/25/21 0421  NA 132*  --  134* 135  K 5.4* 5.6* 5.4* 4.9  CL 101  --  102 99  CO2 24  --  24 27  BUN 54*  --  52* 57*  CREATININE 1.66*  --  1.47* 1.46*  CALCIUM 7.8*  --  8.1* 8.6*  PROT 4.4*  --   --   --   BILITOT 0.6  --   --   --   ALKPHOS 53  --   --   --   ALT 23  --   --   --   AST 19  --   --   --   GLUCOSE 76  --  199* 238*    Recent Labs  Lab 02/22/21 1332 02/23/21 0447  02/23/21 1600 02/24/21 0327 02/25/21 0421  WBC 23.3* 20.4* 22.3* 18.3* 14.2*  HGB 7.1* 5.6* 8.4* 7.4* 7.5*  HCT 20.6* 16.6* 24.5* 22.0* 22.4*  PLT 178 165 197 186 235  MCV 86.6 87.8 86.0 87.6 88.9  MCH 29.8 29.6 29.5 29.5 29.8  MCHC 34.5 33.7 34.3 33.6 33.5  RDW 18.3* 18.4* 16.6* 16.8* 16.5*     Recent Labs  Lab 02/20/21 0324 02/20/21 1007 02/21/21 0321 02/22/21 0015 02/23/21 0447 02/23/21 1600 02/24/21 0327 02/25/21 0421  NA 137  --  138 135 132*  --  134* 135  K 4.7  --  4.3 4.9 5.4* 5.6* 5.4* 4.9  CL 105  --  108 105 101  --  102 99  CO2 25  --  26 24 24   --  24 27  GLUCOSE 112*  --  98 180* 76  --  199* 238*  BUN 35*  --  45* 52* 54*  --  52* 57*  CREATININE 1.36*  --  1.38* 1.66* 1.66*  --  1.47* 1.46*  CALCIUM 8.0*  --  7.9* 7.7* 7.8*  --  8.1* 8.6*  AST  --   --   --   --  19  --   --   --   ALT  --   --   --   --  23  --   --   --   ALKPHOS  --   --   --   --  53  --   --   --   BILITOT  --   --   --   --  0.6  --   --   --   ALBUMIN 2.2*  --  2.1*  --  1.7*  --  1.9* 2.0*  MG 2.2  --  2.2  --  2.2  --  2.5* 2.3  INR  --  0.9  --   --   --   --   --   --   HGBA1C  --   --  5.4  --   --   --   --   --   BNP  --   --  95.9  --  448.7*  --   --  1,024.1*     Recent Labs  Lab 02/21/21 0321 02/23/21 0447 02/25/21 0421  BNP 95.9 448.7* 1,024.1*      Radiology  No new imaging  Assessment & Plan:  Patient stable *Postpartum/post op: H/H holding well. Okay to start anti-coagulation from a surgical standpoint.   Appreciate recs and care with Hospitalist team for her other multiple issues.   Durene Romans MD Attending Center for [place] (Faculty Practice) GYN Consult Phone: [phone] (M-F, [phone]) & [phone]  (Off hours, weekends, holidays)

## 2021-02-25 NOTE — Progress Notes (Signed)
Patient at this time visiting her baby, RN is going to reassess PICC line on patient return if unable to flush a new IV team consult will be placed

## 2021-02-25 NOTE — Progress Notes (Signed)
PROGRESS NOTE    Ashley Camacho  CNO:[id number] DOB: July 22, 1990 DOA: 12/16/2020 PCP: Hildred Laser, MD    No chief complaint on file.   Brief Narrative:  30 y.o. G2P0101 with a history of T1DM, chronic HFrEF and recent hospitalization at [facility] on 6/30 for N/V and rhinovirus pneumonia requiring transfer to [facility].  She was discharged home from [facility] on 7/20 only to return on 7/22 with starvation ketoacidosis in the setting of nausea and vomiting likely due to uncontrolled DM-1 and gastroparesis, and brachial vein DVT.She was a started on TPN through PICC line and transferred to [facility] for cortrak and TF initiation.    Hospital course noteworthy of systolic CHF exacerbation (on IV diuretics), AI (p.o. Cortef), ABLA, hematemesis in the setting of grade C esophagitis on EGD, hematuria (seems to have resolved), moderate right hydronephrosis (improved), Enterococcus faecalis UTI (completed treatment), acute urinary retention (resolved)...   Patient underwent cesarean section on 9/27.  Had postop blood loss with large bladder flap hematoma measuring requiring blood transfusions.  CTA did not show active extravasation.  H&H seems to be stable now.   Assessment & Plan:   Active Problems:   Delivery with history of C-section   Acute kidney injury (HCC)   Hyperemesis   DKA, type 1, not at goal [facility])   Acute deep vein thrombosis (DVT) of brachial vein of right upper extremity (HCC)   Vitreous hemorrhage of right eye (HCC)   Gastroparesis due to DM (HCC)   Diabetic retinopathy (HCC)   Acute on chronic combined systolic and diastolic CHF (congestive heart failure) (HCC)   Type 1 diabetes mellitus affecting pregnancy in second trimester, antepartum   HFrEF (heart failure with reduced ejection fraction) (HCC)   Anemia during pregnancy   Pre-existing type 1 diabetes mellitus during pregnancy in third trimester   History of preterm delivery, currently pregnant   Leg edema   Acute esophagitis   S/P  cesarean section   Postoperative hematoma involving genitourinary system following genitourinary procedure  Acute systolic heart failure with improved EF/volume overload  -Currently on IV Lasix 40 mg twice daily Patient is still fluid overloaded will need IV Lasix for a few more days prior to discharge. Will request cardiology to give [place] input prior to discharge.    Postsurgical anemia of blood loss/hematoma Hemoglobin remains around 7. Transfuse to keep hemoglobin greater than 7.   Acute urinary retention/bilateral hydronephrosis Resolved   Uncontrolled type 1 diabetes with hyperglycemia and hypoglycemia Increasing Semglee to 12 units daily and continue with sliding scale insulin. 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     Hyperkalemia Resolved    Hyponatremia probably from hypervolemia/fluid overload Improving with diuretics.    Hypertension blood pressure parameters have improved.    Right brachial DVT Seems to be recannulating based on repeat ultrasound. Patient can go back to anticoagulation as per primary.   GERD .  Continue with PPI.   AKI/azotemia Patient's baseline creatinine less than 1 currently stable around 1.4.  Continue to monitor.   DVT prophylaxis: SCDs Code Status: full code.  Family Communication: none at bedside.  Disposition:   Status is: Inpatient  Remains inpatient appropriate because:IV treatments appropriate due to intensity of illness or inability to take PO               Antimicrobials: none.    Subjective: Pain at the incision site.    Objective: Vitals:   02/24/21 2347  02/25/21 0431 02/25/21 0817 02/25/21 1215  BP: (!) 145/66 (!) 148/72 135/60 132/67  Pulse: 87 89 89 88  Resp: 16 17 16 17   Temp: 98 F (36.7 C) 98 F (36.7 C) 98.2 F (36.8 C) 98.3 F (36.8 C)  TempSrc: Oral Oral Oral Oral  SpO2: 96% 100% 98% 100%  Weight:      Height:        Intake/Output Summary (Last 24 hours) at 02/25/2021 1618 Last data filed at 02/25/2021 1500 Gross per 24 hour  Intake 170 ml  Output 2750 ml  Net -2580 ml   Filed Weights   01/24/21 0603 01/26/21 0530 02/08/21 1157  Weight: 81.7 kg 80.6 kg 76.1 kg    Examination:  General exam: Appears calm and comfortable  Respiratory system: Clear to auscultation. Respiratory effort normal. Cardiovascular system: S1 & S2 heard, RRR. No JVD, .  3+ leg edema Gastrointestinal system: Abdomen is nondistended, soft and nontender.  Normal bowel sounds heard. Central nervous system: Alert and oriented. No focal neurological deficits. Extremities: Symmetric 5 x 5 power. Skin: No rashes, lesions or ulcers Psychiatry: Mood & affect appropriate.     Data Reviewed: I have personally reviewed following labs and imaging studies  CBC: Recent Labs  Lab 02/22/21 1332 02/23/21 0447 02/23/21 1600 02/24/21 0327 02/25/21 0421  WBC 23.3* 20.4* 22.3* 18.3* 14.2*  HGB 7.1* 5.6* 8.4* 7.4* 7.5*  HCT 20.6* 16.6* 24.5* 22.0* 22.4*  MCV 86.6 87.8 86.0 87.6 88.9  PLT 178 165 197 186 235    Basic Metabolic Panel: Recent Labs  Lab 02/20/21 0324 02/21/21 0321 02/22/21 0015 02/23/21 0447 02/23/21 1600 02/24/21 0327 02/25/21 0421  NA 137 138 135 132*  --  134* 135  K 4.7 4.3 4.9 5.4* 5.6* 5.4* 4.9  CL 105 108 105 101  --  102 99  CO2 25 26 24 24   --  24 27  GLUCOSE 112* 98 180* 76  --  199* 238*  BUN 35* 45* 52* 54*  --  52* 57*  CREATININE 1.36* 1.38* 1.66* 1.66*  --  1.47* 1.46*  CALCIUM 8.0* 7.9* 7.7* 7.8*  --  8.1* 8.6*  MG 2.2 2.2  --  2.2  --  2.5* 2.3  PHOS 4.5 4.5  --  4.9*  --  4.4 4.1    GFR: Estimated  Creatinine Clearance: 53 mL/min (A) (by C-G formula based on SCr of 1.46 mg/dL (H)).  Liver Function Tests: Recent Labs  Lab 02/20/21 0324 02/21/21 0321 02/23/21 0447 02/24/21 0327 02/25/21 0421  AST  --   --  19  --   --   ALT  --   --  23  --   --   ALKPHOS  --   --  53  --   --   BILITOT  --   --  0.6  --   --   PROT  --   --  4.4*  --   --   ALBUMIN 2.2* 2.1* 1.7* 1.9* 2.0*    CBG: Recent Labs  Lab 02/21/21 1038 02/23/21 2242 02/24/21 0625 02/24/21 2057 02/25/21 0119  GLUCAP 160* 227* 275* 243* 185*     Recent Results (  from the past 240 hour(s))  Resp Panel by RT-PCR (Flu A&B, Covid) Nasopharyngeal Swab     Status: None   Collection Time: 02/17/21  6:27 AM   Specimen: Nasopharyngeal Swab; Nasopharyngeal(NP) swabs in vial transport medium  Result Value Ref Range Status   SARS Coronavirus 2 by RT PCR NEGATIVE NEGATIVE Final    Comment: (NOTE) SARS-CoV-2 target nucleic acids are NOT DETECTED.  The SARS-CoV-2 RNA is generally detectable in upper respiratory specimens during the acute phase of infection. The lowest concentration of SARS-CoV-2 viral copies this assay can detect is 138 copies/mL. A negative result does not preclude SARS-Cov-2 infection and should not be used as the sole basis for treatment or other patient management decisions. A negative result may occur with  improper specimen collection/handling, submission of specimen other than nasopharyngeal swab, presence of viral mutation(s) within the areas targeted by this assay, and inadequate number of viral copies(<138 copies/mL). A negative result must be combined with clinical observations, patient history, and epidemiological information. The expected result is Negative.  Fact Sheet for Patients:  BloggerCourse.com  Fact Sheet for Healthcare Providers:  SeriousBroker.it  This test is no t yet approved or cleared by the [place] FDA and  has  been authorized for detection and/or diagnosis of SARS-CoV-2 by FDA under an Emergency Use Authorization (EUA). This EUA will remain  in effect (meaning this test can be used) for the duration of the COVID-19 declaration under Section 564(b)(1) of the Act, 21 U.S.C.section 360bbb-3(b)(1), unless the authorization is terminated  or revoked sooner.       Influenza A by PCR NEGATIVE NEGATIVE Final   Influenza B by PCR NEGATIVE NEGATIVE Final    Comment: (NOTE) The Xpert Xpress SARS-CoV-2/FLU/RSV plus assay is intended as an aid in the diagnosis of influenza from Nasopharyngeal swab specimens and should not be used as a sole basis for treatment. Nasal washings and aspirates are unacceptable for Xpert Xpress SARS-CoV-2/FLU/RSV testing.  Fact Sheet for Patients: BloggerCourse.com  Fact Sheet for Healthcare Providers: SeriousBroker.it  This test is not yet approved or cleared by the [place] FDA and has been authorized for detection and/or diagnosis of SARS-CoV-2 by FDA under an Emergency Use Authorization (EUA). This EUA will remain in effect (meaning this test can be used) for the duration of the COVID-19 declaration under Section 564(b)(1) of the Act, 21 U.S.C. section 360bbb-3(b)(1), unless the authorization is terminated or revoked.  Performed at [facility], [address], [place], [place] [address]          Radiology Studies: No results found.      Scheduled Meds:  sodium chloride   Intravenous Once   alteplase  2 mg Intracatheter Once   Chlorhexidine Gluconate Cloth  6 each Topical Daily   cholecalciferol  1,000 Units Oral Daily   famotidine  20 mg Oral BID   feeding supplement (GLUCERNA SHAKE)  237 mL Oral BID BM   furosemide  40 mg Intravenous Q12H   hydrocortisone  20 mg Oral Q24H   And   hydrocortisone  15 mg Oral Q24H   insulin aspart  0-6 Units Subcutaneous Q4H   insulin aspart  3 Units  Subcutaneous TID WC   [START ON 02/26/2021] insulin glargine-yfgn  12 Units Subcutaneous Daily   measles, mumps & rubella vaccine  0.5 mL Subcutaneous Once   pantoprazole  40 mg Oral Daily   prenatal multivitamin  1 tablet Oral Q1200   simethicone  80 mg Oral TID  PC   sodium chloride flush  10-40 mL Intracatheter Q12H   Tdap  0.5 mL Intramuscular Once   Continuous Infusions:   LOS: 71 days        Kathlen Mody, MD [facility]   To contact the attending provider between 7A-7P or the covering provider during after hours 7P-7A, please log into the web site [email or web address] and access using universal [place] password for that web site. If you do not have the password, please call the hospital operator.  02/25/2021, 4:18 PM

## 2021-02-25 NOTE — Progress Notes (Signed)
Called to bedside to examine incision.  Ecchymosis present, appropriately tender.  No active bleeding noted.  Old blood noted at incision, which had previously been seen.  Induration noted.  Findings consistent with known skin hematoma.  No acute changes noted.  Dressing applied.  Continue with management as previously outlined.  [name], DO Attending Obstetrician & Gynecologist, [facility] for [employer], [facility]

## 2021-02-25 NOTE — Care Management Note (Signed)
Case Management Note  Patient Details  Name: Ashley Camacho MRN: [id number] Date of Birth: 1990/06/09  Subjective/Objective:                  y.o. G2P0101 with a history of T1DM, chronic HFrEF and recent hospitalization at [facility] on 6/30 for N/V and rhinovirus pneumonia requiring transfer to [facility].  She was discharged home from [facility] on 7/20 only to return on 7/22 with starvation ketoacidosis in the setting of nausea and vomiting likely due to uncontrolled DM-1 and gastroparesis, and brachial vein DVT.She was a started on TPN through PICC line and transferred to [facility] for cortrak and TF initiation.    In-House Referral:  [facility]; CSW; Nutrition   Discharge planning Services  CM Consult; PCP    Additional Comments: CM met with patient and discussed choices for PCP. Reviewed demographics and they are correct in system., Patient lives in [place] and she chose [facility] in [place] phone# [phone] for PCP.  CM called and spoke to [facility] and obtained appointment for patient.  Appointment placed in AVS. Reviewed with patient and she is in agreement with plan. Also discussed [facility] and that a referral has been made to follow up with patient after discharge in the home. Mom follows : [facility] in [place] for diabetes - she plans to call for an appt at discharge.   CSW following patient also and baby is in NICU.  She denies any other needs to CM at this time.  No barriers for medications.     Yong Channel, RN 02/25/2021, 1:36 PM

## 2021-02-26 DIAGNOSIS — D649 Anemia, unspecified: Secondary | ICD-10-CM

## 2021-02-26 LAB — CBC WITH DIFFERENTIAL/PLATELET
Abs Immature Granulocytes: 0.06 10*3/uL (ref 0.00–0.07)
Basophils Absolute: 0 10*3/uL (ref 0.0–0.1)
Basophils Relative: 0 %
Eosinophils Absolute: 0 10*3/uL (ref 0.0–0.5)
Eosinophils Relative: 0 %
HCT: 23 % — ABNORMAL LOW (ref 36.0–46.0)
Hemoglobin: 7.4 g/dL — ABNORMAL LOW (ref 12.0–15.0)
Immature Granulocytes: 1 %
Lymphocytes Relative: 6 %
Lymphs Abs: 0.8 10*3/uL (ref 0.7–4.0)
MCH: 28.8 pg (ref 26.0–34.0)
MCHC: 32.2 g/dL (ref 30.0–36.0)
MCV: 89.5 fL (ref 80.0–100.0)
Monocytes Absolute: 0.5 10*3/uL (ref 0.1–1.0)
Monocytes Relative: 4 %
Neutro Abs: 11.5 10*3/uL — ABNORMAL HIGH (ref 1.7–7.7)
Neutrophils Relative %: 89 %
Platelets: 260 10*3/uL (ref 150–400)
RBC: 2.57 MIL/uL — ABNORMAL LOW (ref 3.87–5.11)
RDW: 15.8 % — ABNORMAL HIGH (ref 11.5–15.5)
WBC: 12.9 10*3/uL — ABNORMAL HIGH (ref 4.0–10.5)
nRBC: 0 % (ref 0.0–0.2)

## 2021-02-26 LAB — BASIC METABOLIC PANEL
Anion gap: 7 (ref 5–15)
BUN: 57 mg/dL — ABNORMAL HIGH (ref 6–20)
CO2: 29 mmol/L (ref 22–32)
Calcium: 8.5 mg/dL — ABNORMAL LOW (ref 8.9–10.3)
Chloride: 102 mmol/L (ref 98–111)
Creatinine, Ser: 1.44 mg/dL — ABNORMAL HIGH (ref 0.44–1.00)
GFR, Estimated: 50 mL/min — ABNORMAL LOW (ref >60)
Glucose, Bld: 255 mg/dL — ABNORMAL HIGH (ref 70–99)
Potassium: 4.3 mmol/L (ref 3.5–5.1)
Sodium: 138 mmol/L (ref 135–145)

## 2021-02-26 MED ORDER — HYDROCORTISONE 5 MG PO TABS
15.0000 mg | ORAL_TABLET | ORAL | Status: DC
  Administered 2021-02-27 – 2021-02-28 (×2): 15 mg via ORAL
  Filled 2021-02-26 (×2): qty 1

## 2021-02-26 MED ORDER — ENOXAPARIN SODIUM 60 MG/0.6ML IJ SOSY
60.0000 mg | PREFILLED_SYRINGE | Freq: Two times a day (BID) | INTRAMUSCULAR | Status: DC
  Administered 2021-02-26 – 2021-02-28 (×4): 60 mg via SUBCUTANEOUS
  Filled 2021-02-26 (×4): qty 0.6

## 2021-02-26 MED ORDER — FUROSEMIDE 10 MG/ML IJ SOLN
60.0000 mg | Freq: Two times a day (BID) | INTRAMUSCULAR | Status: DC
  Administered 2021-02-26 – 2021-03-04 (×12): 60 mg via INTRAVENOUS
  Filled 2021-02-26 (×13): qty 6

## 2021-02-26 MED ORDER — HYDROCORTISONE 20 MG PO TABS: 20.0000 mg | ORAL_TABLET | ORAL | Status: DC

## 2021-02-26 MED ORDER — HYDRALAZINE HCL 50 MG PO TABS: 25.0000 mg | ORAL_TABLET | Freq: Three times a day (TID) | ORAL | Status: DC | PRN

## 2021-02-26 MED ORDER — HYDROCORTISONE 10 MG PO TABS
10.0000 mg | ORAL_TABLET | ORAL | Status: DC
  Administered 2021-02-26 – 2021-02-27 (×2): 10 mg via ORAL
  Filled 2021-02-26 (×2): qty 1

## 2021-02-26 MED ORDER — INSULIN ASPART 100 UNIT/ML IJ SOLN
4.0000 [IU] | Freq: Three times a day (TID) | INTRAMUSCULAR | Status: DC
  Administered 2021-02-26 – 2021-03-01 (×8): 4 [IU] via SUBCUTANEOUS

## 2021-02-26 MED ORDER — HYDROCORTISONE 10 MG PO TABS
10.0000 mg | ORAL_TABLET | ORAL | Status: DC
  Filled 2021-02-26: qty 1

## 2021-02-26 MED ORDER — INSULIN GLARGINE-YFGN 100 UNIT/ML ~~LOC~~ SOLN
15.0000 [IU] | Freq: Every day | SUBCUTANEOUS | Status: DC
  Administered 2021-02-27 – 2021-02-28 (×2): 15 [IU] via SUBCUTANEOUS
  Filled 2021-02-26 (×3): qty 0.15

## 2021-02-26 NOTE — Progress Notes (Signed)
Inpatient Diabetes Program Recommendations  AACE/ADA: New Consensus Statement on Inpatient Glycemic Control (2015)  Target Ranges:  Prepandial:   less than 140 mg/dL      Peak postprandial:   less than 180 mg/dL (1-2 hours)      Critically ill patients:  140 - 180 mg/dL   Lab Results  Component Value Date   GLUCAP 185 (H) 02/25/2021   HGBA1C 5.4 02/21/2021    Review of Glycemic Control Results for ANZLEY, DIBBERN (MRN [id number]) as of 02/26/2021 15:07  Ref. Range 02/23/2021 22:42 02/24/2021 06:25 02/24/2021 20:57 02/25/2021 01:19  Glucose-Capillary Latest Ref Range: 70 - 99 mg/dL 846 (H) 962 (H) 952 (H) 185 (H)   Diabetes history: DM 1 Outpatient Diabetes medications: Omnipod with Novolog insulin  Most recent insulin pump rates from [facility]: 0.05 units/hr= 1.2 units in 24 hours, 1 units drops blood sugar 100 mg/dL, 1 units per 30 grams of CHO   In DM coordinator note from 11/20/2020: "Current pump settings are: 12-6am  0.15 units/hour 6am-12am 0.25 units/hour Total 5.4 units/24 hours  CF 80 (1 unit drops glucose 80 mg/dl) I:C 8:41 (1 unit covers 20 grams of carbs) Target BG 100 mg/dl"   Current orders for Inpatient glycemic control:  Semglee 10 units QD, Novolog 0-6 units TID & HS, Novolog 4 units TID, Novolog 4 units x 1 Solucortef 25 mg QAM, 15 mg QPM     If steroids to continue at maintenance does Consider: -Semglee 14 units QD - Question need for continue nutritional supplement?  Thanks, Lujean Rave, MSN, RNC-OB Diabetes Coordinator [phone] (8a-5p)

## 2021-02-26 NOTE — Lactation Note (Signed)
This note was copied from a baby's chart. Lactation Consultation Note  Patient Name: Ashley Camacho Date: 02/26/2021 Reason for consult: Follow-up assessment Age:30 days  Lactation briefly checked in with [name]. She states there is no change in her status with regard to pumping, and she states that she has all needed supplies. She plans to visit [facility] after lunch today.   Feeding Nipple Type: Dr. Levert Feinstein Preemie     Consult Status Consult Status: Follow-up Date: 02/26/21 Follow-up type: In-patient    Walker Shadow 02/26/2021, 1:40 PM

## 2021-02-26 NOTE — Progress Notes (Signed)
Progress Note  Patient Name: Ashley Camacho Date of Encounter: 02/26/2021  [facility] Cardiologist: Thurmon Fair, MD   Subjective   Patient not SOB  No CP   Inpatient Medications    Scheduled Meds:  sodium chloride   Intravenous Once   alteplase  2 mg Intracatheter Once   Chlorhexidine Gluconate Cloth  6 each Topical Daily   cholecalciferol  1,000 Units Oral Daily   enoxaparin (LOVENOX) injection  60 mg Subcutaneous Q12H   famotidine  20 mg Oral BID   feeding supplement (GLUCERNA SHAKE)  237 mL Oral BID BM   furosemide  60 mg Intravenous Q12H   [START ON 02/27/2021] hydrocortisone  15 mg Oral Q24H   And   hydrocortisone  10 mg Oral Q24H   insulin aspart  0-6 Units Subcutaneous Q4H   insulin aspart  4 Units Subcutaneous TID WC   [START ON 02/27/2021] insulin glargine-yfgn  15 Units Subcutaneous Daily   measles, mumps & rubella vaccine  0.5 mL Subcutaneous Once   pantoprazole  40 mg Oral Daily   prenatal multivitamin  1 tablet Oral Q1200   simethicone  80 mg Oral TID PC   sodium chloride flush  10-40 mL Intracatheter Q12H   Tdap  0.5 mL Intramuscular Once   Continuous Infusions:  PRN Meds: acetaminophen, bisacodyl, coconut oil, witch hazel-glycerin **AND** dibucaine, diphenhydrAMINE, hydrALAZINE, HYDROmorphone (DILAUDID) injection, menthol-cetylpyridinium, metoCLOPramide, oxyCODONE, senna-docusate, simethicone, sodium chloride, zolpidem   Vital Signs    Vitals:   02/26/21 0806 02/26/21 1441 02/26/21 1448 02/26/21 1608  BP:  (!) 170/77  (!) 153/82  Pulse:  85  84  Resp:  16    Temp:  98.2 F (36.8 C)    TempSrc:  Oral    SpO2: 96% 100%    Weight:   69.5 kg   Height:        Intake/Output Summary (Last 24 hours) at 02/26/2021 1608 Last data filed at 02/26/2021 1523 Gross per 24 hour  Intake 1010 ml  Output 3750 ml  Net -2740 ml   Last 3 Weights 02/26/2021 02/08/2021 01/26/2021  Weight (lbs) 153 lb 3.2 oz 167 lb 12.8 oz 177 lb 9.6 oz  Weight (kg) 69.491 kg  76.114 kg 80.559 kg      Telemetry     - Personally Reviewed  ECG     - Personally Reviewed  Physical Exam   GEN: No acute distress.   Neck: JVP is increased Cardiac: RRR, no murmurs Split s1   No signif murmurs  Respiratory: Clear to auscultation bilaterally. GI: Soft,  Mildly tender   MS: 2+ LE edema; No deformity. Neuro:  Nonfocal  Psych: Normal affect   Labs    High Sensitivity Troponin:  No results for input(s): TROPONINIHS in the last 720 hours.   Chemistry Recent Labs  Lab 02/23/21 0447 02/23/21 1600 02/24/21 0327 02/25/21 0421 02/26/21 1215  NA 132*  --  134* 135 138  K 5.4*   < > 5.4* 4.9 4.3  CL 101  --  102 99 102  CO2 24  --  24 27 29   GLUCOSE 76  --  199* 238* 255*  BUN 54*  --  52* 57* 57*  CREATININE 1.66*  --  1.47* 1.46* 1.44*  CALCIUM 7.8*  --  8.1* 8.6* 8.5*  MG 2.2  --  2.5* 2.3  --   PROT 4.4*  --   --   --   --   ALBUMIN 1.7*  --  1.9* 2.0*  --   AST 19  --   --   --   --   ALT 23  --   --   --   --   ALKPHOS 53  --   --   --   --   BILITOT 0.6  --   --   --   --   GFRNONAA 43*  --  49* 50* 50*  ANIONGAP 7  --  8 9 7    < > = values in this interval not displayed.    Lipids No results for input(s): CHOL, TRIG, HDL, LABVLDL, LDLCALC, CHOLHDL in the last 168 hours.  Hematology Recent Labs  Lab 02/24/21 0327 02/25/21 0421 02/26/21 1215  WBC 18.3* 14.2* 12.9*  RBC 2.51* 2.52* 2.57*  HGB 7.4* 7.5* 7.4*  HCT 22.0* 22.4* 23.0*  MCV 87.6 88.9 89.5  MCH 29.5 29.8 28.8  MCHC 33.6 33.5 32.2  RDW 16.8* 16.5* 15.8*  PLT 186 235 260   Thyroid No results for input(s): TSH, FREET4 in the last 168 hours.  BNP Recent Labs  Lab 02/21/21 0321 02/23/21 0447 02/25/21 0421  BNP 95.9 448.7* 1,024.1*    DDimer No results for input(s): DDIMER in the last 168 hours.   Radiology    No results found.  Cardiac Studies   A small pericardial effusion is present. The pericardial effusion is posterior to the left ventricle. There is no  evidence of cardiac tamponade. 1. Left ventricular ejection fraction, by estimation, is 50 to 55%. The left ventricle has low normal function. 2. Right ventricular systolic function is normal. The right ventricular size is normal. There is normal pulmonary artery systolic pressure. The estimated right ventricular systolic pressure is 34.4 mmHg. 3. 4. The mitral valve is grossly normal. Mild to moderate mitral valve regurgitation. 5. Tricuspid valve regurgitation is moderate. 6. The aortic valve is normal in structure. Aortic valve regurgitation is not visualized. The inferior vena cava is normal in size with <50% respiratory variability, suggesting right atrial pressure of 8 mmHg. 7. Comparison(s): A prior study was performed on 12/17/20. Prior images reviewed side by side. LVEF has improved slightly. No change in pericardial effusion. 12/28/20    Patient Profile     30 y.o. female G64P0101 with hx of T1 DM, systolic CHF, adrenal insufficiency, esophagitis, hydronephrosis, UTI.  Asked to see in follow up for recommendations on medical Rx    Assessment & Plan    Patient previously seen by cardiology service   Last on 01/06/21 prior to C Section (done on 9/27)     She has remained in hosp since    Currently with signif volume increase  Hypertensive    Note plans for possible d/c home this weekend  Note that pt is curr getting TPN   1   Acute on chronic systolic / diastolic CHF      VOlume is increased   She is getting 60 mg IV lasix bid  need strict I/O    Fluid restrict to 1500 cc  Low Na diet   Note that Alb is 2 which compounds problem  Would recomm repeat echo to reeval LVEF    I have reviewed with pharmacy drugs that are safe with lactation.   Ca blockers and ACE I OK   Await results of echo to decide oral agents  2  HTN   BP is high today    Contnue diuresis  for now and prn hydralazine   Review  echo    3  Anemia   Had blood loss  with large bladder flap hemaatoma  has been  transfused   H/H stable but low  4  DM   Type I    IM treating  5  Adrenal insuff    On Cortef 10 mg at night  now   Dose   6   Renal  Baseline Cr 1   Now 1.4   Follow as diurese     For questions or updates, please contact [facility] Please consult [email or web address] for contact info under        Signed, Dietrich Pates, MD  02/26/2021, 4:08 PM

## 2021-02-26 NOTE — Progress Notes (Addendum)
Daily Obstetrics Note  Admission Date: 12/16/2020 Current Date: 02/26/2021 9:21 AM  Ashley Camacho is a 30 y.o. G2P0202 POD#9 s/p repeat low transverse c-section via pfannenstiela incision and bilateral salpingectomy@ [redacted]w[redacted]d admitted for multiple medical issues.  Post op course c/b suprapubic hematoma followed expectantly.  Pregnancy complicated by: Patient Active Problem List   Diagnosis Date Noted   Postoperative hematoma involving genitourinary system following genitourinary procedure    S/P cesarean section 02/17/2021   Acute esophagitis    Leg edema    History of preterm delivery, currently pregnant    Pre-existing type 1 diabetes mellitus during pregnancy in third trimester    Anemia during pregnancy    Type 1 diabetes mellitus affecting pregnancy in second trimester, antepartum    HFrEF (heart failure with reduced ejection fraction) (HCC)    Acute deep vein thrombosis (DVT) of brachial vein of right upper extremity ([facility] 12/17/2020   Anxiety 12/17/2020   Diabetic retinopathy ([facility] 12/17/2020   Acute on chronic combined systolic and diastolic CHF (congestive heart failure) ([facility]    Hyperemesis 12/13/2020   DKA, type 1, not at goal ([facility] 12/13/2020   Sunburn 12/13/2020   Hypothyroidism 12/10/2020   Malnutrition ([facility] 12/10/2020   Vitreous hemorrhage of right eye ([facility] 12/10/2020   Pleural effusion associated with pulmonary infection 11/25/2020   Pneumonia affecting pregnancy 11/24/2020   Diabetes mellitus affecting pregnancy, second trimester 11/24/2020   Edema 11/24/2020   Decreased urine output 11/24/2020   Shortness of breath    Zinc deficiency    SOB (shortness of breath)    Social problem 11/21/2020   Nausea and vomiting during pregnancy prior to [redacted] weeks gestation 11/21/2020   Low serum vitamin B12    Delivery with history of C-section 11/20/2020   Absolute anemia    Acute kidney injury ([facility]    Nausea and vomiting during pregnancy 10/23/2020   History of  premature delivery, currently pregnant, second trimester 10/13/2020   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 Still meeting post op goals  Objective:     Current Vital Signs 24h Vital Sign Ranges  T 98.6 F (37 C) Temp  Avg: 98.2 F (36.8 C)  Min: 97.7 F (36.5 C)  Max: 98.6 F (37 C)  BP (!) 154/75 BP  Min: 132/67  Max: 154/75  HR 93 Pulse  Avg: 89.8  Min: 86  Max: 93  RR 16 Resp  Avg: 17.2  Min: 16  Max: 18  SaO2 96 % Room Air SpO2  Avg: 98.2 %  Min: 96 %  Max: 100 %       24 Hour I/O Current Shift I/O  Time Ins Outs 10/05 0701 - 10/06 0700 In: 457 [P.O.:457] Out: 3550 [Urine:3550] 10/06 0701 - 10/06 1900 In: 50 [P.O.:50] Out: 350 [Urine:350]   Patient Vitals for the past 24 hrs:  BP Temp Temp src Pulse Resp SpO2  02/26/21 0806 -- -- -- -- -- 96 %  02/26/21 0805 (!) 154/75 98.6 F (37 C) Oral 93 16 --  02/26/21 0420 (!) 150/71 98.2 F (36.8 C) Oral 86 16 96 %  02/25/21 2358 (!) 153/71 97.7 F (36.5 C) Oral 90 18 100 %  02/25/21 1943 (!) 152/73 98 F (36.7 C)  Oral 92 18 97 %  02/25/21 1618 (!) 147/73 98.3 F (36.8 C) Oral 90 18 100 %  02/25/21 1215 132/67 98.3 F (36.8 C) Oral 88 17 100 %     Physical exam: General: Well nourished, well developed female in no acute distress. Abdomen: (stable from yesterday). incision c/d/I, +BS; stable indurated area above the skin to a few cm below the umbilicus that is starting to bruise. Incision c/d/I with some old blood that is dried and out of the incision which is likely the hematoma coming through. No e/o separation, infection or breakdown of the incision. Minimally and appropriately ttp Cardiovascular: S1, S2 normal, no murmur, rub or gallop,  regular rate and rhythm Respiratory: CTAB Extremities: no clubbing, cyanosis or edema Skin: Warm and dry.   Medications: Current Facility-Administered Medications  Medication Dose Route Frequency Provider Last Rate Last Admin   0.9 %  sodium chloride infusion (Manually program via Guardrails IV Fluids)   Intravenous Once Woodroe Mode, MD       acetaminophen (TYLENOL) tablet 1,000 mg  1,000 mg Oral Q6H PRN Aletha Halim, MD   1,000 mg at 02/26/21 0429   alteplase (CATHFLO ACTIVASE) injection 2 mg  2 mg Intracatheter Once Mercy Riding, MD       bisacodyl (DULCOLAX) suppository 10 mg  10 mg Rectal Daily PRN Aletha Halim, MD       Chlorhexidine Gluconate Cloth 2 % PADS 6 each  6 each Topical Daily Renard Matter, MD   6 each at 02/26/21 0916   cholecalciferol (VITAMIN D3) tablet 1,000 Units  1,000 Units Oral Daily Renard Matter, MD   1,000 Units at 02/26/21 0916   coconut oil  1 application Topical PRN Renard Matter, MD       witch hazel-glycerin (TUCKS) pad 1 application  1 application Topical PRN Renard Matter, MD       And   dibucaine (NUPERCAINAL) 1 % rectal ointment 1 application  1 application Rectal PRN Renard Matter, MD       diphenhydrAMINE (BENADRYL) capsule 25 mg  25 mg Oral Q6H PRN Renard Matter, MD       famotidine (PEPCID) tablet 20 mg  20 mg Oral BID Renard Matter, MD   20 mg at 02/26/21 0916   feeding supplement (GLUCERNA SHAKE) ([place]) liquid 237 mL  237 mL Oral BID BM Renard Matter, MD   237 mL at 02/26/21 0916   furosemide (LASIX) injection 40 mg  40 mg Intravenous Q12H Wendee Beavers T, MD   40 mg at 02/26/21 0818   hydrocortisone (CORTEF) tablet 20 mg  20 mg Oral Q24H Wendee Beavers T, MD   20 mg at 02/26/21 9292   And   hydrocortisone (CORTEF) tablet 15 mg  15 mg Oral Q24H Wendee Beavers T, MD   15 mg at 02/25/21 1751   HYDROmorphone (DILAUDID) injection 1 mg  1 mg Intravenous Q2H PRN Radene Gunning, MD   1 mg at 02/24/21 2136   insulin aspart (novoLOG) injection 0-6 Units  0-6 Units  Subcutaneous Q4H Hosie Poisson, MD   1 Units at 02/26/21 0819   insulin aspart (novoLOG) injection 3 Units  3 Units Subcutaneous TID WC Mercy Riding, MD   3 Units at 02/26/21 0915   insulin glargine-yfgn (SEMGLEE) injection 12 Units  12 Units Subcutaneous Daily Hosie Poisson, MD   12 Units at 02/26/21 0915   measles, mumps & rubella vaccine (MMR) injection 0.5 mL  0.5 mL Subcutaneous Once  Renard Matter, MD       menthol-cetylpyridinium (CEPACOL) lozenge 3 mg  1 lozenge Oral Q2H PRN Renard Matter, MD       metoCLOPramide (REGLAN) tablet 5 mg  5 mg Oral TID WC PRN Wendee Beavers T, MD       oxyCODONE (Oxy IR/ROXICODONE) immediate release tablet 5-10 mg  5-10 mg Oral Q4H PRN Renard Matter, MD   10 mg at 02/25/21 2012   pantoprazole (PROTONIX) EC tablet 40 mg  40 mg Oral Daily Renard Matter, MD   40 mg at 02/26/21 9373   prenatal multivitamin tablet 1 tablet  1 tablet Oral Q1200 Renard Matter, MD   1 tablet at 02/26/21 0916   senna-docusate (Senokot-S) tablet 2 tablet  2 tablet Oral QHS PRN Aletha Halim, MD       simethicone (MYLICON) chewable tablet 80 mg  80 mg Oral TID PC Renard Matter, MD   80 mg at 02/26/21 0819   simethicone (MYLICON) chewable tablet 80 mg  80 mg Oral PRN Renard Matter, MD   80 mg at 02/19/21 0147   sodium chloride (OCEAN) 0.65 % nasal spray 1 spray  1 spray Each Nare PRN Renard Matter, MD       sodium chloride flush (NS) 0.9 % injection 10-40 mL  10-40 mL Intracatheter Q12H Renard Matter, MD   20 mL at 02/26/21 0917   Tdap (BOOSTRIX) injection 0.5 mL  0.5 mL Intramuscular Once Renard Matter, MD       zolpidem [facility]) tablet 5 mg  5 mg Oral QHS PRN Renard Matter, MD        Labs:  Recent Labs  Lab 02/22/21 1332 02/23/21 0447 02/23/21 1600 02/24/21 0327 02/25/21 0421  WBC 23.3* 20.4* 22.3* 18.3* 14.2*  HGB 7.1* 5.6* 8.4* 7.4* 7.5*  HCT 20.6* 16.6* 24.5* 22.0* 22.4*  PLT 178 165 197 186 235  MCV 86.6 87.8 86.0 87.6 88.9  MCH 29.8 29.6 29.5 29.5 29.8  MCHC 34.5 33.7 34.3 33.6 33.5  RDW 18.3* 18.4*  16.6* 16.8* 16.5*     Recent Labs  Lab 02/20/21 0324 02/20/21 1007 02/21/21 0321 02/22/21 0015 02/23/21 0447 02/23/21 1600 02/24/21 0327 02/25/21 0421  NA 137  --  138 135 132*  --  134* 135  K 4.7  --  4.3 4.9 5.4* 5.6* 5.4* 4.9  CL 105  --  108 105 101  --  102 99  CO2 25  --  26 24 24   --  24 27  GLUCOSE 112*  --  98 180* 76  --  199* 238*  BUN 35*  --  45* 52* 54*  --  52* 57*  CREATININE 1.36*  --  1.38* 1.66* 1.66*  --  1.47* 1.46*  CALCIUM 8.0*  --  7.9* 7.7* 7.8*  --  8.1* 8.6*  AST  --   --   --   --  19  --   --   --   ALT  --   --   --   --  23  --   --   --   ALKPHOS  --   --   --   --  53  --   --   --   BILITOT  --   --   --   --  0.6  --   --   --   ALBUMIN 2.2*  --  2.1*  --  1.7*  --  1.9* 2.0*  MG  2.2  --  2.2  --  2.2  --  2.5* 2.3  INR  --  0.9  --   --   --   --   --   --   HGBA1C  --   --  5.4  --   --   --   --   --   BNP  --   --  95.9  --  448.7*  --   --  1,024.1*    Radiology  No new imaging  Assessment & Plan:  Patient stable *Postpartum/post op: I d/w her that from a surgical and OB stand point she is doing well and no need to stay in the hospital and that the hematoma will resolve by itself over the next 3-4 weeks, but that her primary, medicine team will need to make sure she is okay for discharge from their standpoint especially with her BNP trending up.   Okay to start anti-coagulation from a surgical standpoint>message sent to Dr. Karleen Hampshire re: this.   Appreciate care with Hospitalist team for her other multiple issues.   Durene Romans MD Attending [facility] [place] (Faculty Practice) GYN Consult Phone: [phone] (M-F, [phone]) & [phone]  (Off hours, weekends, holidays)

## 2021-02-26 NOTE — Progress Notes (Signed)
PROGRESS NOTE    Ashley Camacho  HMC:[id number] DOB: 1991-05-03 DOA: 12/16/2020 PCP: Hildred Laser, MD    No chief complaint on file.   Brief Narrative:  30 y.o. G2P0101 with a history of T1DM, chronic HFrEF and recent hospitalization at [facility] on 6/30 for N/V and rhinovirus pneumonia requiring transfer to [facility].  She was discharged home from [facility] on 7/20 only to return on 7/22 with starvation ketoacidosis in the setting of nausea and vomiting likely due to uncontrolled DM-1 and gastroparesis, and brachial vein DVT.She was a started on TPN through PICC line and transferred to [facility] for cortrak and TF initiation.    Hospital course noteworthy of systolic CHF exacerbation (on IV diuretics), AI (p.o. Cortef), ABLA, hematemesis in the setting of grade C esophagitis on EGD, hematuria (seems to have resolved), moderate right hydronephrosis (improved), Enterococcus faecalis UTI (completed treatment), acute urinary retention (resolved)...   Patient underwent cesarean section on 9/27.  Had postop blood loss with large bladder flap hematoma measuring requiring blood transfusions.  CTA did not show active extravasation.  H&H seems to be stable now. Pt seen this afternoon, still very much fluid overloaded.    Assessment & Plan:   Active Problems:   Delivery with history of C-section   Acute kidney injury (HCC)   Hyperemesis   DKA, type 1, not at goal [facility])   Acute deep vein thrombosis (DVT) of brachial vein of right upper extremity (HCC)   Vitreous hemorrhage of right eye (HCC)   Gastroparesis due to DM (HCC)   Diabetic retinopathy (HCC)   Acute on chronic combined systolic and diastolic CHF (congestive heart failure) (HCC)   Type 1 diabetes mellitus affecting pregnancy in second trimester, antepartum   HFrEF (heart failure with reduced ejection fraction) (HCC)   Anemia during pregnancy   Pre-existing type 1 diabetes mellitus during pregnancy in third trimester   History of preterm delivery,  currently pregnant   Leg edema   Acute esophagitis   S/P cesarean section   Postoperative hematoma involving genitourinary system following genitourinary procedure  Acute systolic heart failure with improved EF/volume overload  -Currently on IV Lasix 40 mg twice daily, increased the dose to 60 mg BID AS pt is still fluid overloaded. .  Creatinine remains stable around 1.4. potassium is around 4.3. Repeat BNP is 1024. Diuresed about 3.5 lit in the last 24 hours.  Will request cardiology to give [place] input prior to discharge.    Postsurgical anemia of blood loss/hematoma Hemoglobin remains around 7. Transfuse to keep hemoglobin greater than 7.    Acute urinary retention/bilateral hydronephrosis Resolved. Creatinine stable around 1.4.    Uncontrolled type 1 diabetes with hyperglycemia and hypoglycemia Increasing Semglee to 15 units daily and continue with sliding scale insulin. CBG's continue to remain greater than 200.  Pt plans to go back on the insulin pump post discharge.    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  She will need close follow-up with endocrinology postdischarge.   Hyperkalemia Resolved  Hyponatremia probably from hypervolemia/fluid overload Improving with diuretics.    Hypertension  blood pressure parameters have improved.    Right brachial DVT Restarted the patient on lovenox today.  Will request pharmacy to dose it.    GERD .  Continue with  PPI.   AKI/azotemia Patient's baseline creatinine less than 1 currently stable around 1.4 with IV diuresis.   Continue to monitor.  Leukocytosis  Improving.   IUP: S/P C section delivery.    Hypokalemia and hypomagnesemia:  Replaced.    Enterococcus faecalis UTI:  Resolved.     Acute thrombocytopenia:  Resolved.    DVT prophylaxis: SCDs Code Status: full code.  Family Communication: none at bedside.  Disposition:   Status is: Inpatient  Remains inpatient appropriate because:IV treatments appropriate due to intensity of illness or inability to take PO               Antimicrobials: none.    Subjective: Pt reports the pain is getting better, she is getting out of bed .   Objective: Vitals:   02/25/21 2358 02/26/21 0420 02/26/21 0805 02/26/21 0806  BP: (!) 153/71 (!) 150/71 (!) 154/75   Pulse: 90 86 93   Resp: 18 16 16    Temp: 97.7 F (36.5 C) 98.2 F (36.8 C) 98.6 F (37 C)   TempSrc: Oral Oral Oral   SpO2: 100% 96%  96%  Weight:      Height:        Intake/Output Summary (Last 24 hours) at 02/26/2021 1338 Last data filed at 02/26/2021 1055 Gross per 24 hour  Intake 650 ml  Output 3950 ml  Net -3300 ml    Filed Weights   01/24/21 0603 01/26/21 0530 02/08/21 1157  Weight: 81.7 kg 80.6 kg 76.1 kg    Examination: General exam: Appears calm and comfortable  Respiratory system: diminished air entry at bases, on RA.  Cardiovascular system: S1 & S2 heard, RRR. 3+  pedal edema. Gastrointestinal system: Abdomen is nondistended, soft and nontender.  Normal bowel sounds heard. Central nervous system: Alert and oriented. No focal neurological deficits. Extremities: Symmetric 5 x 5 power. Skin: No rashes, lesions or ulcers Psychiatry:  Mood & affect appropriate.     Data Reviewed: I have personally reviewed following labs and imaging studies  CBC: Recent Labs  Lab 02/23/21 0447 02/23/21 1600 02/24/21 0327 02/25/21 0421 02/26/21 1215  WBC 20.4* 22.3* 18.3* 14.2* 12.9*  NEUTROABS  --   --   --   --  11.5*  HGB 5.6* 8.4* 7.4* 7.5* 7.4*  HCT 16.6* 24.5* 22.0* 22.4* 23.0*  MCV 87.8 86.0 87.6 88.9 89.5  PLT 165 197 186 235 260     Basic Metabolic Panel: Recent Labs  Lab 02/20/21 0324 02/21/21 0321 02/22/21 0015  02/23/21 0447 02/23/21 1600 02/24/21 0327 02/25/21 0421 02/26/21 1215  NA 137 138 135 132*  --  134* 135 138  K 4.7 4.3 4.9 5.4* 5.6* 5.4* 4.9 4.3  CL 105 108 105 101  --  102 99 102  CO2 25 26 24 24   --  24 27 29   GLUCOSE 112* 98 180* 76  --  199* 238* 255*  BUN 35* 45* 52* 54*  --  52* 57* 57*  CREATININE 1.36* 1.38* 1.66* 1.66*  --  1.47* 1.46* 1.44*  CALCIUM 8.0* 7.9* 7.7* 7.8*  --  8.1* 8.6* 8.5*  MG 2.2 2.2  --  2.2  --  2.5* 2.3  --   PHOS 4.5 4.5  --  4.9*  --  4.4 4.1  --      GFR: Estimated Creatinine Clearance: 53.8 mL/min (A) (by C-G formula based on SCr of 1.44 mg/dL (H)).  Liver Function Tests: Recent Labs  Lab 02/20/21 0324 02/21/21 0321  02/23/21 0447 02/24/21 0327 02/25/21 0421  AST  --   --  19  --   --   ALT  --   --  23  --   --   ALKPHOS  --   --  53  --   --   BILITOT  --   --  0.6  --   --   PROT  --   --  4.4*  --   --   ALBUMIN 2.2* 2.1* 1.7* 1.9* 2.0*     CBG: Recent Labs  Lab 02/21/21 1038 02/23/21 2242 02/24/21 0625 02/24/21 2057 02/25/21 0119  GLUCAP 160* 227* 275* 243* 185*      Recent Results (from the past 240 hour(s))  Resp Panel by RT-PCR (Flu A&B, Covid) Nasopharyngeal Swab     Status: None   Collection Time: 02/17/21  6:27 AM   Specimen: Nasopharyngeal Swab; Nasopharyngeal(NP) swabs in vial transport medium  Result Value Ref Range Status   SARS Coronavirus 2 by RT PCR NEGATIVE NEGATIVE Final    Comment: (NOTE) SARS-CoV-2 target nucleic acids are NOT DETECTED.  The SARS-CoV-2 RNA is generally detectable in upper respiratory specimens during the acute phase of infection. The lowest concentration of SARS-CoV-2 viral copies this assay can detect is 138 copies/mL. A negative result does not preclude SARS-Cov-2 infection and should not be used as the sole basis for treatment or other patient management decisions. A negative result may occur with  improper specimen collection/handling, submission of specimen other than  nasopharyngeal swab, presence of viral mutation(s) within the areas targeted by this assay, and inadequate number of viral copies(<138 copies/mL). A negative result must be combined with clinical observations, patient history, and epidemiological information. The expected result is Negative.  Fact Sheet for Patients:  BloggerCourse.com  Fact Sheet for Healthcare Providers:  SeriousBroker.it  This test is no t yet approved or cleared by the [place] FDA and  has been authorized for detection and/or diagnosis of SARS-CoV-2 by FDA under an Emergency Use Authorization (EUA). This EUA will remain  in effect (meaning this test can be used) for the duration of the COVID-19 declaration under Section 564(b)(1) of the Act, 21 U.S.C.section 360bbb-3(b)(1), unless the authorization is terminated  or revoked sooner.       Influenza A by PCR NEGATIVE NEGATIVE Final   Influenza B by PCR NEGATIVE NEGATIVE Final    Comment: (NOTE) The Xpert Xpress SARS-CoV-2/FLU/RSV plus assay is intended as an aid in the diagnosis of influenza from Nasopharyngeal swab specimens and should not be used as a sole basis for treatment. Nasal washings and aspirates are unacceptable for Xpert Xpress SARS-CoV-2/FLU/RSV testing.  Fact Sheet for Patients: BloggerCourse.com  Fact Sheet for Healthcare Providers: SeriousBroker.it  This test is not yet approved or cleared by the [place] FDA and has been authorized for detection and/or diagnosis of SARS-CoV-2 by FDA under an Emergency Use Authorization (EUA). This EUA will remain in effect (meaning this test can be used) for the duration of the COVID-19 declaration under Section 564(b)(1) of the Act, 21 U.S.C. section 360bbb-3(b)(1), unless the authorization is terminated or revoked.  Performed at [facility], [address], [place], [place] [address]            Radiology Studies: No results found.      Scheduled Meds:  sodium chloride   Intravenous Once   alteplase  2 mg Intracatheter Once   Chlorhexidine Gluconate Cloth  6 each Topical Daily  cholecalciferol  1,000 Units Oral Daily   famotidine  20 mg Oral BID   feeding supplement (GLUCERNA SHAKE)  237 mL Oral BID BM   furosemide  40 mg Intravenous Q12H   hydrocortisone  10 mg Oral Q24H   And   [START ON 02/27/2021] hydrocortisone  20 mg Oral Q24H   insulin aspart  0-6 Units Subcutaneous Q4H   insulin aspart  4 Units Subcutaneous TID WC   [START ON 02/27/2021] insulin glargine-yfgn  15 Units Subcutaneous Daily   measles, mumps & rubella vaccine  0.5 mL Subcutaneous Once   pantoprazole  40 mg Oral Daily   prenatal multivitamin  1 tablet Oral Q1200   simethicone  80 mg Oral TID PC   sodium chloride flush  10-40 mL Intracatheter Q12H   Tdap  0.5 mL Intramuscular Once   Continuous Infusions:   LOS: 72 days        Kathlen Mody, MD [facility]   To contact the attending provider between 7A-7P or the covering provider during after hours 7P-7A, please log into the web site [email or web address] and access using universal [place] password for that web site. If you do not have the password, please call the hospital operator.  02/26/2021, 1:38 PM

## 2021-02-26 NOTE — Progress Notes (Signed)
ANTICOAGULATION CONSULT NOTE - Follow Up Consult  Pharmacy Consult for Lovenox Indication: DVT  No Known Allergies  Patient Measurements: Height: 5\' 1"  (154.9 cm) Weight: 69.5 kg (153 lb 3.2 oz) IBW/kg (Calculated) : 47.8  Vital Signs: Temp: 98.2 F (36.8 C) (10/06 1441) Temp Source: Oral (10/06 1441) BP: 170/77 (10/06 1441) Pulse Rate: 85 (10/06 1441)  Labs: Recent Labs    02/24/21 0327 02/25/21 0421 02/26/21 1215  HGB 7.4* 7.5* 7.4*  HCT 22.0* 22.4* 23.0*  PLT 186 235 260  CREATININE 1.47* 1.46* 1.44*    Estimated Creatinine Clearance: 51.4 mL/min (A) (by C-G formula based on SCr of 1.44 mg/dL (H)).   Assessment: 15 YOF known to pharmacy for complex PMH, high risk pregnancy.  Pt with R brachial VTE on treatment dose anticoagulation (lovenox/heparin) during pregnancy. She is s/p C/S delivery on 9/27, but unfortunately developed bladder flap hematoma after restarting lovenox. Lovenox has been holding since 10/1. Pt has been stable now, hgb 7.4, low but stable x 3 days, Pltc 260K. Pharmacy is consulted to restart lovenox.  Current weight 69.5kg. AKI, scr stable at 1.44, est GFR ~ 11ml/min. Reviewed previous dosing history. On 8/26, she was on lovenox ~ 60mg  BID, LMWH level was ~ 0.53 - 0.59, and her weight was ~ 71-72 kg at that time.    Goal of Therapy Anti-Xa level 0.6-1 units/ml 4hrs after LMWH dose given Monitor platelets by anticoagulation protocol: Yes   Plan:  Lovenox 60 mg SQ Q 12 hrs, first dose this evening at 1800 F/u discharge plan. Check LMW heparin level after 3 doses Monitor CBC and s/sx of bleeding  9/26, PharmD, BCPS, BCPPS Clinical Pharmacist  Pager: [phone]  02/26/2021,2:54 PM

## 2021-02-27 ENCOUNTER — Inpatient Hospital Stay (HOSPITAL_COMMUNITY): Payer: Medicaid Other

## 2021-02-27 DIAGNOSIS — I5021 Acute systolic (congestive) heart failure: Secondary | ICD-10-CM

## 2021-02-27 LAB — BASIC METABOLIC PANEL
Anion gap: 6 (ref 5–15)
BUN: 61 mg/dL — ABNORMAL HIGH (ref 6–20)
CO2: 32 mmol/L (ref 22–32)
Calcium: 8.3 mg/dL — ABNORMAL LOW (ref 8.9–10.3)
Chloride: 101 mmol/L (ref 98–111)
Creatinine, Ser: 1.39 mg/dL — ABNORMAL HIGH (ref 0.44–1.00)
GFR, Estimated: 53 mL/min — ABNORMAL LOW (ref >60)
Glucose, Bld: 326 mg/dL — ABNORMAL HIGH (ref 70–99)
Potassium: 4.4 mmol/L (ref 3.5–5.1)
Sodium: 139 mmol/L (ref 135–145)

## 2021-02-27 LAB — ECHOCARDIOGRAM COMPLETE
AR max vel: 1.46 cm2
AV Area VTI: 1.47 cm2
AV Area mean vel: 1.42 cm2
AV Mean grad: 3 mmHg
AV Peak grad: 6.1 mmHg
Ao pk vel: 1.23 m/s
Area-P 1/2: 5.54 cm2
Height: 61 in
S' Lateral: 3.2 cm
Weight: 2451.2 oz

## 2021-02-27 LAB — CBC
HCT: 25.1 % — ABNORMAL LOW (ref 36.0–46.0)
Hemoglobin: 8 g/dL — ABNORMAL LOW (ref 12.0–15.0)
MCH: 29 pg (ref 26.0–34.0)
MCHC: 31.9 g/dL (ref 30.0–36.0)
MCV: 90.9 fL (ref 80.0–100.0)
Platelets: 307 10*3/uL (ref 150–400)
RBC: 2.76 MIL/uL — ABNORMAL LOW (ref 3.87–5.11)
RDW: 15.1 % (ref 11.5–15.5)
WBC: 13.4 10*3/uL — ABNORMAL HIGH (ref 4.0–10.5)
nRBC: 0 % (ref 0.0–0.2)

## 2021-02-27 MED ORDER — INSULIN ASPART 100 UNIT/ML IJ SOLN
0.0000 [IU] | Freq: Every day | INTRAMUSCULAR | Status: DC
  Administered 2021-02-28 – 2021-03-01 (×2): 2 [IU] via SUBCUTANEOUS

## 2021-02-27 MED ORDER — INSULIN ASPART 100 UNIT/ML IJ SOLN
0.0000 [IU] | Freq: Three times a day (TID) | INTRAMUSCULAR | Status: DC
  Administered 2021-02-27: 1 [IU] via SUBCUTANEOUS
  Administered 2021-02-28: 2 [IU] via SUBCUTANEOUS
  Administered 2021-02-28 (×2): 1 [IU] via SUBCUTANEOUS
  Administered 2021-03-01: 3 [IU] via SUBCUTANEOUS

## 2021-02-27 MED ORDER — AMLODIPINE BESYLATE 5 MG PO TABS
2.5000 mg | ORAL_TABLET | Freq: Every day | ORAL | Status: DC
  Administered 2021-02-27: 2.5 mg via ORAL
  Filled 2021-02-27: qty 1

## 2021-02-27 NOTE — Progress Notes (Addendum)
Daily Obstetrics Consult Note  Admission Date: 12/16/2020 Current Date: 02/27/2021 9:33 AM  Ashley Camacho Ashley Camacho is a 30 y.o. G2P0202 POD#10 s/p repeat low transverse c-section via pfannenstiela incision and bilateral salpingectomy@ [redacted]w[redacted]d admitted for multiple medical issues.  Post op course c/b suprapubic hematoma followed expectantly.  Pregnancy complicated by: Patient Active Problem List   Diagnosis Date Noted   Postoperative hematoma involving genitourinary system following genitourinary procedure    S/P cesarean section 02/17/2021   Acute esophagitis    Leg edema    History of preterm delivery, currently pregnant    Pre-existing type 1 diabetes mellitus during pregnancy in third trimester    Anemia during pregnancy    Type 1 diabetes mellitus affecting pregnancy in second trimester, antepartum    HFrEF (heart failure with reduced ejection fraction) (HCC)    Acute deep vein thrombosis (DVT) of brachial vein of right upper extremity ([place] 12/17/2020   Anxiety 12/17/2020   Diabetic retinopathy ([place] 12/17/2020   Acute on chronic combined systolic and diastolic CHF (congestive heart failure) ([place]    Hyperemesis 12/13/2020   DKA, type 1, not at goal ([facility] 12/13/2020   Sunburn 12/13/2020   Hypothyroidism 12/10/2020   Malnutrition ([place] 12/10/2020   Vitreous hemorrhage of right eye ([place] 12/10/2020   Pleural effusion associated with pulmonary infection 11/25/2020   Pneumonia affecting pregnancy 11/24/2020   Diabetes mellitus affecting pregnancy, second trimester 11/24/2020   Edema 11/24/2020   Decreased urine output 11/24/2020   Shortness of breath    Zinc deficiency    SOB (shortness of breath)    Social problem 11/21/2020   Nausea and vomiting during pregnancy prior to [redacted] weeks gestation 11/21/2020   Low serum vitamin B12    Delivery with history of C-section 11/20/2020   Absolute anemia    [place]ute kidney injury ([place]    Nausea and vomiting during pregnancy 10/23/2020   History  of premature delivery, currently pregnant, second trimester 10/13/2020   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 Still meeting post op goals  Objective:     Current Vital Signs 24h Vital Sign Ranges  T 97.8 F (36.6 C) Temp  Avg: 97.9 F (36.6 C)  Min: 97.5 F (36.4 C)  Max: 98.2 F (36.8 C)  BP (!) 154/72 BP  Min: 151/70  Max: 170/77  HR 88 Pulse  Avg: 84.3  Min: 81  Max: 88  RR 16 Resp  Avg: 16.2  Min: 16  Max: 17  SaO2 100 % Room Air SpO2  Avg: 99.5 %  Min: 97 %  Max: 100 %       24 Hour I/O Current Shift I/O  Time Ins Outs 10/06 0701 - 10/07 0700 In: 1250 [P.O.:1250] Out: 4750 [Urine:4750] 10/07 0701 - 10/07 1900 In: -  Out: 550 [Urine:550]   Patient Vitals for the past 24 hrs:  BP Temp Temp src Pulse Resp SpO2 Weight  02/27/21 0814 (!) 154/72 97.8 F (36.6 C) Oral 88 16 100 % --  02/27/21 0357 (!) 151/70 -- -- 81 16 100 % --  02/26/21 2344 (!) 152/74 97.8 F (36.6 C) Oral 83 16 100 % --  02/26/21 1930 (!) 154/76 (!) 97.5 F (36.4 C) Oral 85 17 100 % --  02/26/21 1608 (!) 153/82 98.2 F (36.8 C) Oral 84 16 97 % --  02/26/21 1448 -- -- -- -- -- -- 69.5 kg  02/26/21 1441 (!) 170/77 98.2 F (36.8 C) Oral 85 16 100 % --     Physical exam: General: Well nourished, well developed female in no acute distress. Abdomen: (stable from yesterday). incision c/d/I, +BS; stable indurated area above the skin to a few cm below the umbilicus that is slightly bruised. ncision c/d/I with some old blood that is dried and out of the incision which is likely the hematoma coming through. No e/o separation, infection or breakdown of the incision. Minimally and appropriately ttp It looks like the old blood at  the incision is attached to dermabond. Nothing active is coming out of the incision. The op note doesn't state dermabond was used but the OR billing states a dermabond was used so I suspect that's what it is and not any new blood or hematoma coming out of the incision.  Cardiovascular: S1, S2 normal, no murmur, rub or gallop, regular rate and rhythm Respiratory: CTAB Extremities: no clubbing, cyanosis or edema Skin: Warm and dry.   Medications: Current Facility-Administered Medications  Medication Dose Route Frequency Provider Last Rate Last Admin   0.9 %  sodium chloride infusion (Manually program via Guardrails IV Fluids)   Intravenous Once Woodroe Mode, MD       acetaminophen (TYLENOL) tablet 1,000 mg  1,000 mg Oral Q6H PRN Aletha Halim, MD   1,000 mg at 02/27/21 0439   alteplase (CATHFLO ACTIVASE) injection 2 mg  2 mg Intracatheter Once Mercy Riding, MD       bisacodyl (DULCOLAX) suppository 10 mg  10 mg Rectal Daily PRN Aletha Halim, MD       Chlorhexidine Gluconate Cloth 2 % PADS 6 each  6 each Topical Daily Renard Matter, MD   6 each at 02/26/21 0916   cholecalciferol (VITAMIN D3) tablet 1,000 Units  1,000 Units Oral Daily Renard Matter, MD   1,000 Units at 02/26/21 0916   coconut oil  1 application Topical PRN Renard Matter, MD       witch hazel-glycerin (TUCKS) pad 1 application  1 application Topical PRN Renard Matter, MD       And   dibucaine (NUPERCAINAL) 1 % rectal ointment 1 application  1 application Rectal PRN Renard Matter, MD       diphenhydrAMINE (BENADRYL) capsule 25 mg  25 mg Oral Q6H PRN Renard Matter, MD       enoxaparin (LOVENOX) injection 60 mg  60 mg Subcutaneous Q12H Leodis Sias, RPH   60 mg at 02/27/21 0530   famotidine (PEPCID) tablet 20 mg  20 mg Oral BID Renard Matter, MD   20 mg at 02/26/21 2230   furosemide (LASIX) injection 60 mg  60 mg Intravenous Q12H Hosie Poisson, MD   60 mg at 02/27/21 0408   hydrALAZINE (APRESOLINE) tablet 25 mg  25 mg Oral Q8H PRN Hosie Poisson,  MD       hydrocortisone (CORTEF) tablet 15 mg  15 mg Oral Q24H Hosie Poisson, MD   15 mg at 02/27/21 1761   And   hydrocortisone (CORTEF) tablet 10 mg  10 mg Oral Q24H Hosie Poisson, MD   10 mg at 02/26/21 1717   HYDROmorphone (DILAUDID) injection 1 mg  1 mg Intravenous Q2H PRN Radene Gunning, MD   1 mg at 02/27/21 0116   insulin aspart (novoLOG) injection 0-6 Units  0-6 Units Subcutaneous Q4H Hosie Poisson, MD   4 Units at 02/27/21 0825   insulin aspart (novoLOG) injection 4 Units  4 Units Subcutaneous TID WC Hosie Poisson, MD   4 Units at 02/26/21 1840   insulin glargine-yfgn (SEMGLEE) injection 15 Units  15 Units Subcutaneous Daily Hosie Poisson, MD       measles, mumps & rubella vaccine (MMR) injection 0.5 mL  0.5 mL Subcutaneous Once Renard Matter, MD       menthol-cetylpyridinium (CEPACOL) lozenge 3 mg  1 lozenge Oral Q2H PRN Renard Matter, MD       metoCLOPramide (REGLAN) tablet 5 mg  5 mg Oral TID WC PRN Wendee Beavers T, MD       oxyCODONE (Oxy IR/ROXICODONE) immediate release tablet 5-10 mg  5-10 mg Oral Q4H PRN Renard Matter, MD   5 mg at 02/26/21 2350   pantoprazole (PROTONIX) EC tablet 40 mg  40 mg Oral Daily Renard Matter, MD   40 mg at 02/26/21 0277   prenatal multivitamin tablet 1 tablet  1 tablet Oral Q1200 Renard Matter, MD   1 tablet at 02/26/21 0916   senna-docusate (Senokot-S) tablet 2 tablet  2 tablet Oral QHS PRN Aletha Halim, MD       simethicone (MYLICON) chewable tablet 80 mg  80 mg Oral TID PC Renard Matter, MD   80 mg at 02/27/21 0825   simethicone (MYLICON) chewable tablet 80 mg  80 mg Oral PRN Renard Matter, MD   80 mg at 02/19/21 0147   sodium chloride (OCEAN) 0.65 % nasal spray 1 spray  1 spray Each Nare PRN Renard Matter, MD       sodium chloride flush (NS) 0.9 % injection 10-40 mL  10-40 mL Intracatheter Q12H Renard Matter, MD   10 mL at 02/26/21 2231   Tdap (BOOSTRIX) injection 0.5 mL  0.5 mL Intramuscular Once Renard Matter, MD       zolpidem [facility]) tablet 5 mg  5 mg Oral QHS PRN Renard Matter, MD        Labs:  Recent Labs  Lab 02/23/21 0447 02/23/21 1600 02/24/21 0327 02/25/21 0421 02/26/21 1215  WBC 20.4* 22.3* 18.3* 14.2* 12.9*  HGB 5.6* 8.4* 7.4* 7.5* 7.4*  HCT 16.6* 24.5* 22.0* 22.4* 23.0*  PLT 165 197 186 235 260  MCV 87.8 86.0 87.6 88.9 89.5  MCH 29.6 29.5 29.5 29.8 28.8  MCHC 33.7 34.3 33.6 33.5 32.2  RDW 18.4* 16.6* 16.8* 16.5* 15.8*  LYMPHSABS  --   --   --   --  0.8  MONOABS  --   --   --   --  0.5  EOSABS  --   --   --   --  0.0  BASOSABS  --   --   --   --  0.0     Recent Labs  Lab 02/20/21 1007 02/21/21 0321 02/22/21 0015 02/23/21 0447 02/23/21 1600 02/24/21 0327 02/25/21 0421 02/26/21 1215 02/27/21 0412  NA  --  138   < > 132*  --  134* 135 138 139  K  --  4.3   < > 5.4* 5.6* 5.4* 4.9 4.3 4.4  CL  --  108   < > 101  --  102 99 102 101  CO2  --  26   < > 24  --  _0 32  GLUCOSE  --  98   < > 76  --  199* 238* 255* 326*  BUN  --  45*   < > 54*  --  52* 57* 57* 61*  CREATININE  --  1.38*   < > 1.66*  --  1.47* 1.46* 1.44* 1.39*  CALCIUM  --  7.9*   < > 7.8*  --  8.1* 8.6* 8.5* 8.3*  AST  --   --   --  19  --   --   --   --   --   ALT  --   --   --  23  --   --   --   --   --   ALKPHOS  --   --   --  53  --   --   --   --   --   BILITOT  --   --   --  0.6  --   --   --   --   --   ALBUMIN  --  2.1*  --  1.7*  --  1.9* 2.0*  --   --   MG  --  2.2  --  2.2  --  2.5* 2.3  --   --   INR 0.9  --   --   --   --   --   --   --   --   HGBA1C  --  5.4  --   --   --   --   --   --   --   BNP  --  95.9  --  448.7*  --   --  1,024.1*  --   --    < > = values in this interval not displayed.    Radiology  No new imaging  Assessment & Plan:  Patient stable *Postpartum/post op: doing well. We will continue to check on her incision daily while she is inpatient.   Appreciate care with Hospitalist team for her other multiple issues.   Durene Romans MD [facility]ending [facility] (Faculty Practice) GYN Consult Phone:  [phone] (M-F, [phone]) & [phone]  (Off hours, weekends, holidays)

## 2021-02-27 NOTE — Progress Notes (Signed)
PROGRESS NOTE    Ashley Camacho  KGU:[id number] DOB: [birth year] DOA: 12/16/2020 PCP: Hildred Laser, MD    No chief complaint on file.   Brief Narrative:  30 y.o. G2P0101 with a history of T1DM, chronic HFrEF and recent hospitalization at [facility] on 6/30 for N/V and rhinovirus pneumonia requiring transfer to [facility].  She was discharged home from [facility] on 7/20 only to return on 7/22 with starvation ketoacidosis in the setting of nausea and vomiting likely due to uncontrolled DM-1 and gastroparesis, and brachial vein DVT.She was a started on TPN through PICC line and transferred to [facility] for cortrak and TF initiation.    Hospital course noteworthy of systolic CHF exacerbation (on IV diuretics), AI (p.o. Cortef), ABLA, hematemesis in the setting of grade C esophagitis on EGD, hematuria (seems to have resolved), moderate right hydronephrosis (improved), Enterococcus faecalis UTI (completed treatment), acute urinary retention (resolved)...   Patient underwent cesarean section on 9/27.  Had postop blood loss with large bladder flap hematoma measuring requiring blood transfusions.  CTA did not show active extravasation.  H&H seems to be stable now. Pt seen, diuresing well with increased dose of IV lasix .  Continue the same, repeat ECHO.    Assessment & Plan:   Active Problems:   Delivery with history of C-section   Acute kidney injury (HCC)   Hyperemesis   DKA, type 1, not at goal [facility])   Acute deep vein thrombosis (DVT) of brachial vein of right upper extremity (HCC)   Vitreous hemorrhage of right eye (HCC)   Gastroparesis due to DM (HCC)   Diabetic retinopathy (HCC)   Acute on chronic combined systolic and diastolic CHF (congestive heart failure) (HCC)   Type 1 diabetes mellitus affecting pregnancy in second trimester, antepartum   HFrEF (heart failure with reduced ejection fraction) (HCC)   Anemia during pregnancy   Pre-existing type 1 diabetes mellitus during pregnancy in third trimester    History of preterm delivery, currently pregnant   Leg edema   Acute esophagitis   S/P cesarean section   Postoperative hematoma involving genitourinary system following genitourinary procedure  Acute systolic heart failure with improved EF/volume overload  -Currently on IV Lasix 40 mg twice daily, increased the dose to 60 mg BID as pt is still fluid overloaded. .  Strict intake and output and fluid restriction to .  Creatinine improving.  Repeat BNP is 1024. Diuresed about 16 lit so far.  Appreciate cardiology input . Repeated the echocardiogram.     Postsurgical anemia of blood loss/hematoma Hemoglobin remains around 7.  Transfuse to keep hemoglobin greater than 7. Recheck cbc now that we started her on Lovenox.     Acute urinary retention/bilateral hydronephrosis Resolved. Creatinine stable around 1.3.   Uncontrolled type 1 diabetes with hyperglycemia and hypoglycemia Increasing Semglee to 15 units daily and continue with sliding scale insulin. CBG's continue to remain between 300 to 180.  Pt plans to go back on the insulin pump post discharge.    Adrenal insufficiency Low cortisol level with minimal to no response to ACTH probably suggests primary adrenal insufficiency Slowly weaning Cortef Currently on hydrocortisone 15 mg in the morning and 10 at night.  She will need close follow-up with endocrinology postdischarge.   Hyperkalemia Resolved  Hyponatremia probably from hypervolemia/fluid overload Improving with diuretics.    Hypertension  Uncontrolled hypertension, started weaning the solu cortef.  Cardiology added 2.5 of amlodipine.    Right brachial DVT Restarted the patient on lovenox  Will request  pharmacy to dose it.    GERD .  Continue with PPI.   AKI/azotemia Patient's baseline creatinine less than 1 currently stable around 1.3 with IV diuresis.   Continue to monitor.  Leukocytosis  Improving.   IUP: S/P C section delivery at 34  weeks.    Hypokalemia and hypomagnesemia:  Replaced.    Enterococcus faecalis UTI:  Resolved.    Acute thrombocytopenia:  Resolved.    DVT prophylaxis: SCDs Code Status: full code.  Family Communication: none at bedside.  Disposition:   Status is: Inpatient  Remains inpatient appropriate because:IV treatments appropriate due to intensity of illness or inability to take PO               Antimicrobials: none.    Subjective: No new complaints this morning.   Objective: Vitals:   02/26/21 2344 02/27/21 0357 02/27/21 0814 02/27/21 1229  BP: (!) 152/74 (!) 151/70 (!) 154/72 (!) 152/63  Pulse: 83 81 88 82  Resp: 16 16 16 18   Temp: 97.8 F (36.6 C)  97.8 F (36.6 C) (!) 97.3 F (36.3 C)  TempSrc: Oral  Oral Oral  SpO2: 100% 100% 100% 100%  Weight:      Height:        Intake/Output Summary (Last 24 hours) at 02/27/2021 1702 Last data filed at 02/27/2021 1608 Gross per 24 hour  Intake 240 ml  Output 4300 ml  Net -4060 ml    Filed Weights   01/26/21 0530 02/08/21 1157 02/26/21 1448  Weight: 80.6 kg 76.1 kg 69.5 kg    Examination: General exam: Appears calm and comfortable  Respiratory system: Clear to auscultation. Respiratory effort normal. Cardiovascular system: S1 & S2 heard, RRR. No  JVD Gastrointestinal system: Abdomen is nondistended, soft and nontender.  Normal bowel sounds heard. Central nervous system: Alert and oriented. No focal neurological deficits. Extremities: leg edema improving.  Skin: c section incision area , indurated, and erythematous. Improving pain.  Psychiatry:  Mood & affect appropriate.      Data Reviewed: I have personally reviewed following labs and imaging studies  CBC: Recent Labs  Lab 02/23/21 0447 02/23/21 1600 02/24/21 0327 02/25/21 0421 02/26/21 1215  WBC 20.4* 22.3* 18.3* 14.2* 12.9*  NEUTROABS  --   --   --   --  11.5*  HGB 5.6* 8.4* 7.4* 7.5* 7.4*  HCT 16.6* 24.5* 22.0* 22.4* 23.0*  MCV 87.8 86.0  87.6 88.9 89.5  PLT 165 197 186 235 260     Basic Metabolic Panel: Recent Labs  Lab 02/21/21 0321 02/22/21 0015 02/23/21 0447 02/23/21 1600 02/24/21 0327 02/25/21 0421 02/26/21 1215 02/27/21 0412  NA 138   < > 132*  --  134* 135 138 139  K 4.3   < > 5.4* 5.6* 5.4* 4.9 4.3 4.4  CL 108   < > 101  --  102 99 102 101  CO2 26   < > 24  --  24 27 29  32  GLUCOSE 98   < > 76  --  199* 238* 255* 326*  BUN 45*   < > 54*  --  52* 57* 57* 61*  CREATININE 1.38*   < > 1.66*  --  1.47* 1.46* 1.44* 1.39*  CALCIUM 7.9*   < > 7.8*  --  8.1* 8.6* 8.5* 8.3*  MG 2.2  --  2.2  --  2.5* 2.3  --   --   PHOS 4.5  --  4.9*  --  4.4 4.1  --   --    < > =  values in this interval not displayed.     GFR: Estimated Creatinine Clearance: 53.3 mL/min (A) (by C-G formula based on SCr of 1.39 mg/dL (H)).  Liver Function Tests: Recent Labs  Lab 02/21/21 0321 02/23/21 0447 02/24/21 0327 02/25/21 0421  AST  --  19  --   --   ALT  --  23  --   --   ALKPHOS  --  53  --   --   BILITOT  --  0.6  --   --   PROT  --  4.4*  --   --   ALBUMIN 2.1* 1.7* 1.9* 2.0*     CBG: Recent Labs  Lab 02/21/21 1038 02/23/21 2242 02/24/21 0625 02/24/21 2057 02/25/21 0119  GLUCAP 160* 227* 275* 243* 185*      No results found for this or any previous visit (from the past 240 hour(s)).        Radiology Studies: ECHOCARDIOGRAM COMPLETE  Result Date: 02/27/2021    ECHOCARDIOGRAM REPORT   Patient Name:   MARIAVICTORIA NOTTINGHAM Date of Exam: 02/27/2021 Medical Rec #:  [id number]       Height:       61.0 in Accession #:    [id number]      Weight:       153.2 lb Date of Birth:  Jul 31, 1990      BSA:          1.686 m Patient Age:    29 years        BP:           154/72 mmHg Patient Gender: F               HR:           85 bpm. Exam Location:  Inpatient Procedure: 2D Echo, Cardiac Doppler and Color Doppler Indications:    CHF-Acute Systolic I50.21  History:        Patient has prior history of Echocardiogram examinations,  most                 recent 12/28/2020. Risk Factors:Diabetes and Hypertension. Edema.                 Post Cesarean at [redacted] weeks gestation. One prior child.  Sonographer:    Roosvelt Maser RDCS Referring Phys: [id number] PAULA V ROSS IMPRESSIONS  1. Left ventricular ejection fraction, by estimation, is 50 to 55%. The left ventricle has low normal function. The left ventricle demonstrates global hypokinesis. Left ventricular diastolic parameters were normal.  2. Right ventricular systolic function is normal. The right ventricular size is normal. There is normal pulmonary artery systolic pressure. The estimated right ventricular systolic pressure is 35.7 mmHg.  3. A small pericardial effusion is present. The pericardial effusion is posterior to the left ventricle. There is no evidence of cardiac tamponade.  4. The mitral valve is normal in structure. Moderate mitral valve regurgitation. No evidence of mitral stenosis.  5. The aortic valve is normal in structure. Aortic valve regurgitation is trivial. No aortic stenosis is present.  6. Pulmonic valve regurgitation is moderate.  7. The inferior vena cava is normal in size with greater than 50% respiratory variability, suggesting right atrial pressure of 3 mmHg. Comparison(s): No significant change from prior study. Prior images reviewed side by side. FINDINGS  Left Ventricle: Left ventricular ejection fraction, by estimation, is 50 to 55%. The left ventricle has low normal function. The left ventricle demonstrates global hypokinesis. The left  ventricular internal cavity size was normal in size. There is no left ventricular hypertrophy. Left ventricular diastolic parameters were normal. Right Ventricle: The right ventricular size is normal. No increase in right ventricular wall thickness. Right ventricular systolic function is normal. There is normal pulmonary artery systolic pressure. The tricuspid regurgitant velocity is 2.86 m/s, and  with an assumed right atrial pressure of 3  mmHg, the estimated right ventricular systolic pressure is 35.7 mmHg. Left Atrium: Left atrial size was normal in size. Right Atrium: Right atrial size was normal in size. Pericardium: A small pericardial effusion is present. The pericardial effusion is posterior to the left ventricle. There is no evidence of cardiac tamponade. Mitral Valve: The mitral valve is normal in structure. Moderate mitral valve regurgitation, with posteriorly-directed jet. No evidence of mitral valve stenosis. Tricuspid Valve: The tricuspid valve is normal in structure. Tricuspid valve regurgitation is mild . No evidence of tricuspid stenosis. Aortic Valve: The aortic valve is normal in structure. Aortic valve regurgitation is trivial. No aortic stenosis is present. Aortic valve mean gradient measures 3.0 mmHg. Aortic valve peak gradient measures 6.1 mmHg. Aortic valve area, by VTI measures 1.47 cm. Pulmonic Valve: The pulmonic valve was normal in structure. Pulmonic valve regurgitation is moderate. No evidence of pulmonic stenosis. Aorta: The aortic root is normal in size and structure. Venous: The inferior vena cava is normal in size with greater than 50% respiratory variability, suggesting right atrial pressure of 3 mmHg. IAS/Shunts: No atrial level shunt detected by color flow Doppler.  LEFT VENTRICLE PLAX 2D LVIDd:         4.10 cm   Diastology LVIDs:         3.20 cm   LV e' medial:    8.93 cm/s LV PW:         1.00 cm   LV E/e' medial:  16.5 LV IVS:        0.80 cm   LV e' lateral:   12.20 cm/s LVOT diam:     1.70 cm   LV E/e' lateral: 12.0 LV SV:         37 LV SV Index:   22 LVOT Area:     2.27 cm  RIGHT VENTRICLE RV Basal diam:  3.10 cm LEFT ATRIUM             Index        RIGHT ATRIUM           Index LA diam:        4.00 cm 2.37 cm/m   RA Area:     15.40 cm LA Vol (A2C):   43.1 ml 25.56 ml/m  RA Volume:   37.50 ml  22.24 ml/m LA Vol (A4C):   34.9 ml 20.69 ml/m LA Biplane Vol: 39.5 ml 23.42 ml/m  AORTIC VALVE AV Area (Vmax):     1.46 cm AV Area (Vmean):   1.42 cm AV Area (VTI):     1.47 cm AV Vmax:           123.00 cm/s AV Vmean:          84.700 cm/s AV VTI:            0.252 m AV Peak Grad:      6.1 mmHg AV Mean Grad:      3.0 mmHg LVOT Vmax:         79.10 cm/s LVOT Vmean:        53.100 cm/s LVOT VTI:  0.163 m LVOT/AV VTI ratio: 0.65  AORTA Ao Root diam: 2.70 cm Ao Asc diam:  2.50 cm MITRAL VALVE                TRICUSPID VALVE MV Area (PHT): 5.54 cm     TR Peak grad:   32.7 mmHg MV Decel Time: 137 msec     TR Vmax:        286.00 cm/s MV E velocity: 147.00 cm/s MV A velocity: 102.00 cm/s  SHUNTS MV E/A ratio:  1.44         Systemic VTI:  0.16 m                             Systemic Diam: 1.70 cm Donato Schultz MD Electronically signed by Donato Schultz MD Signature Date/Time: 02/27/2021/11:47:22 AM    Final         Scheduled Meds:  sodium chloride   Intravenous Once   alteplase  2 mg Intracatheter Once   amLODipine  2.5 mg Oral Daily   Chlorhexidine Gluconate Cloth  6 each Topical Daily   cholecalciferol  1,000 Units Oral Daily   enoxaparin (LOVENOX) injection  60 mg Subcutaneous Q12H   famotidine  20 mg Oral BID   furosemide  60 mg Intravenous Q12H   hydrocortisone  15 mg Oral Q24H   And   hydrocortisone  10 mg Oral Q24H   insulin aspart  0-6 Units Subcutaneous Q4H   insulin aspart  4 Units Subcutaneous TID WC   insulin glargine-yfgn  15 Units Subcutaneous Daily   measles, mumps & rubella vaccine  0.5 mL Subcutaneous Once   pantoprazole  40 mg Oral Daily   prenatal multivitamin  1 tablet Oral Q1200   simethicone  80 mg Oral TID PC   sodium chloride flush  10-40 mL Intracatheter Q12H   Tdap  0.5 mL Intramuscular Once   Continuous Infusions:   LOS: 73 days        Kathlen Mody, MD [facility]   To contact the attending provider between 7A-7P or the covering provider during after hours 7P-7A, please log into the web site [email or web address] and access using universal [place] password for that  web site. If you do not have the password, please call the hospital operator.  02/27/2021, 5:02 PM

## 2021-02-27 NOTE — Progress Notes (Signed)
  Echocardiogram 2D Echocardiogram has been performed.  Ashley Camacho F 02/27/2021, 10:20 AM

## 2021-02-27 NOTE — Progress Notes (Signed)
Progress Note  Patient Name: MATILYNN DACEY Date of Encounter: 02/27/2021  [facility] Cardiologist: Thurmon Fair, MD   Subjective   Breathing is OK at rest   NO CP   Inpatient Medications    Scheduled Meds:  sodium chloride   Intravenous Once   alteplase  2 mg Intracatheter Once   Chlorhexidine Gluconate Cloth  6 each Topical Daily   cholecalciferol  1,000 Units Oral Daily   enoxaparin (LOVENOX) injection  60 mg Subcutaneous Q12H   famotidine  20 mg Oral BID   furosemide  60 mg Intravenous Q12H   hydrocortisone  15 mg Oral Q24H   And   hydrocortisone  10 mg Oral Q24H   insulin aspart  0-6 Units Subcutaneous Q4H   insulin aspart  4 Units Subcutaneous TID WC   insulin glargine-yfgn  15 Units Subcutaneous Daily   measles, mumps & rubella vaccine  0.5 mL Subcutaneous Once   pantoprazole  40 mg Oral Daily   prenatal multivitamin  1 tablet Oral Q1200   simethicone  80 mg Oral TID PC   sodium chloride flush  10-40 mL Intracatheter Q12H   Tdap  0.5 mL Intramuscular Once   Continuous Infusions:  PRN Meds: acetaminophen, bisacodyl, coconut oil, witch hazel-glycerin **AND** dibucaine, diphenhydrAMINE, hydrALAZINE, HYDROmorphone (DILAUDID) injection, menthol-cetylpyridinium, metoCLOPramide, oxyCODONE, senna-docusate, simethicone, sodium chloride, zolpidem   Vital Signs    Vitals:   02/26/21 1608 02/26/21 1930 02/26/21 2344 02/27/21 0357  BP: (!) 153/82 (!) 154/76 (!) 152/74 (!) 151/70  Pulse: 84 85 83 81  Resp: 16 17 16 16   Temp: 98.2 F (36.8 C) (!) 97.5 F (36.4 C) 97.8 F (36.6 C)   TempSrc: Oral Oral Oral   SpO2: 97% 100% 100% 100%  Weight:      Height:        Intake/Output Summary (Last 24 hours) at 02/27/2021 0710 Last data filed at 02/27/2021 0532 Gross per 24 hour  Intake 1250 ml  Output 4750 ml  Net -3500 ml   Last 3 Weights 02/26/2021 02/08/2021 01/26/2021  Weight (lbs) 153 lb 3.2 oz 167 lb 12.8 oz 177 lb 9.6 oz  Weight (kg) 69.491 kg 76.114 kg  80.559 kg      Telemetry     - Personally Reviewed Not on tele ECG     No new  - Personally Reviewed  Physical Exam   GEN: No acute distress.   Neck: JVP is increased Cardiac: RRR, no murmurs Split s1   Respiratory: Clear to auscultation bilaterally. GI: Soft,  Mildly tender diffuse   MS: 2+ LE edema up leg   No deformity. Neuro:  Nonfocal  Psych: Normal affect   Labs    High Sensitivity Troponin:  No results for input(s): TROPONINIHS in the last 720 hours.   Chemistry Recent Labs  Lab 02/23/21 0447 02/23/21 1600 02/24/21 0327 02/25/21 0421 02/26/21 1215 02/27/21 0412  NA 132*  --  134* 135 138 139  K 5.4*   < > 5.4* 4.9 4.3 4.4  CL 101  --  102 99 102 101  CO2 24  --  24 27 29  32  GLUCOSE 76  --  199* 238* 255* 326*  BUN 54*  --  52* 57* 57* 61*  CREATININE 1.66*  --  1.47* 1.46* 1.44* 1.39*  CALCIUM 7.8*  --  8.1* 8.6* 8.5* 8.3*  MG 2.2  --  2.5* 2.3  --   --   PROT 4.4*  --   --   --   --   --  ALBUMIN 1.7*  --  1.9* 2.0*  --   --   AST 19  --   --   --   --   --   ALT 23  --   --   --   --   --   ALKPHOS 53  --   --   --   --   --   BILITOT 0.6  --   --   --   --   --   GFRNONAA 43*  --  49* 50* 50* 53*  ANIONGAP 7  --  8 9 7 6    < > = values in this interval not displayed.    Lipids No results for input(s): CHOL, TRIG, HDL, LABVLDL, LDLCALC, CHOLHDL in the last 168 hours.  Hematology Recent Labs  Lab 02/24/21 0327 02/25/21 0421 02/26/21 1215  WBC 18.3* 14.2* 12.9*  RBC 2.51* 2.52* 2.57*  HGB 7.4* 7.5* 7.4*  HCT 22.0* 22.4* 23.0*  MCV 87.6 88.9 89.5  MCH 29.5 29.8 28.8  MCHC 33.6 33.5 32.2  RDW 16.8* 16.5* 15.8*  PLT 186 235 260   Thyroid No results for input(s): TSH, FREET4 in the last 168 hours.  BNP Recent Labs  Lab 02/21/21 0321 02/23/21 0447 02/25/21 0421  BNP 95.9 448.7* 1,024.1*    DDimer No results for input(s): DDIMER in the last 168 hours.   Radiology    No results found.  Cardiac Studies   A small pericardial  effusion is present. The pericardial effusion is posterior to the left ventricle. There is no evidence of cardiac tamponade. 1. Left ventricular ejection fraction, by estimation, is 50 to 55%. The left ventricle has low normal function. 2. Right ventricular systolic function is normal. The right ventricular size is normal. There is normal pulmonary artery systolic pressure. The estimated right ventricular systolic pressure is 34.4 mmHg. 3. 4. The mitral valve is grossly normal. Mild to moderate mitral valve regurgitation. 5. Tricuspid valve regurgitation is moderate. 6. The aortic valve is normal in structure. Aortic valve regurgitation is not visualized. The inferior vena cava is normal in size with <50% respiratory variability, suggesting right atrial pressure of 8 mmHg. 7. Comparison(s): A prior study was performed on 12/17/20. Prior images reviewed side by side. LVEF has improved slightly. No change in pericardial effusion. 12/28/20    Patient Profile     30 y.o. female G68P0101 with hx of T1 DM, systolic CHF, adrenal insufficiency, esophagitis, hydronephrosis, UTI.  Asked to see in follow up for recommendations on medical Rx    Assessment & Plan    Patient previously seen by cardiology service   Last on 01/06/21 prior to C Section (done on 9/27)     She has remained in hosp since    Currently with signif volume increase  Hypertensive    Note plans for possible d/c home this weekend  Note that pt is curr getting TPN   1   Acute on chronic systolic / diastolic CHF       Volume remains increased  Echo today shows RVEF and LVEF are normal    It will be difficult to remove fluid given hypoalbuminemia Needs increased nutrition, increase protein stores    She is getting 60 mg IV lasix bid  need strict I/O  REcheck BMET and BNP in am        Fluid restrict to 1500 cc  Low Na diet     2  HTN   BP is remains  high today    Continue diuresis   I have reviewed with pharmacy   ACE I/ARB would be ok  with breastfeeding but, hold for now while trying to diurese in settin of increased Cr.   Can try amlodipine   Start low dose 2.5    3  Anemia   Had blood loss  with large bladder flap hemaatoma  has been transfused   H/H stable but low     4  DM   Type I    IM treating  5  Adrenal insuff    On Cortef 10 mg at night  now   Dose   6   Renal  Baseline Cr 1   Now 1.4   Follow as diurese     For questions or updates, please contact [facility] Please consult [email or web address] for contact info under        Signed, Dietrich Pates, MD  02/27/2021, 7:10 AM

## 2021-02-28 DIAGNOSIS — I5032 Chronic diastolic (congestive) heart failure: Secondary | ICD-10-CM

## 2021-02-28 LAB — HEPATIC FUNCTION PANEL
ALT: 23 U/L (ref 0–44)
AST: 23 U/L (ref 15–41)
Albumin: 2.1 g/dL — ABNORMAL LOW (ref 3.5–5.0)
Alkaline Phosphatase: 102 U/L (ref 38–126)
Bilirubin, Direct: 0.1 mg/dL (ref 0.0–0.2)
Indirect Bilirubin: 0.9 mg/dL (ref 0.3–0.9)
Total Bilirubin: 1 mg/dL (ref 0.3–1.2)
Total Protein: 5.2 g/dL — ABNORMAL LOW (ref 6.5–8.1)

## 2021-02-28 LAB — TYPE AND SCREEN
ABO/RH(D): A POS
Antibody Screen: NEGATIVE

## 2021-02-28 LAB — GLUCOSE, CAPILLARY
Glucose-Capillary: 174 mg/dL — ABNORMAL HIGH (ref 70–99)
Glucose-Capillary: 210 mg/dL — ABNORMAL HIGH (ref 70–99)
Glucose-Capillary: 226 mg/dL — ABNORMAL HIGH (ref 70–99)

## 2021-02-28 LAB — CBC
HCT: 24.2 % — ABNORMAL LOW (ref 36.0–46.0)
Hemoglobin: 7.9 g/dL — ABNORMAL LOW (ref 12.0–15.0)
MCH: 29.5 pg (ref 26.0–34.0)
MCHC: 32.6 g/dL (ref 30.0–36.0)
MCV: 90.3 fL (ref 80.0–100.0)
Platelets: 308 10*3/uL (ref 150–400)
RBC: 2.68 MIL/uL — ABNORMAL LOW (ref 3.87–5.11)
RDW: 15.1 % (ref 11.5–15.5)
WBC: 12.1 10*3/uL — ABNORMAL HIGH (ref 4.0–10.5)
nRBC: 0 % (ref 0.0–0.2)

## 2021-02-28 LAB — BASIC METABOLIC PANEL
Anion gap: 8 (ref 5–15)
BUN: 60 mg/dL — ABNORMAL HIGH (ref 6–20)
CO2: 32 mmol/L (ref 22–32)
Calcium: 8.5 mg/dL — ABNORMAL LOW (ref 8.9–10.3)
Chloride: 99 mmol/L (ref 98–111)
Creatinine, Ser: 1.35 mg/dL — ABNORMAL HIGH (ref 0.44–1.00)
GFR, Estimated: 55 mL/min — ABNORMAL LOW (ref >60)
Glucose, Bld: 214 mg/dL — ABNORMAL HIGH (ref 70–99)
Potassium: 4.6 mmol/L (ref 3.5–5.1)
Sodium: 139 mmol/L (ref 135–145)

## 2021-02-28 LAB — BRAIN NATRIURETIC PEPTIDE: B Natriuretic Peptide: 715.6 pg/mL — ABNORMAL HIGH (ref 0.0–100.0)

## 2021-02-28 MED ORDER — AMLODIPINE BESYLATE 5 MG PO TABS
5.0000 mg | ORAL_TABLET | Freq: Every day | ORAL | Status: DC
  Administered 2021-02-28 – 2021-03-04 (×5): 5 mg via ORAL
  Filled 2021-02-28 (×5): qty 1

## 2021-02-28 MED ORDER — HYDROCORTISONE 5 MG PO TABS
5.0000 mg | ORAL_TABLET | ORAL | Status: DC
  Administered 2021-02-28 – 2021-03-04 (×5): 5 mg via ORAL
  Filled 2021-02-28 (×6): qty 1

## 2021-02-28 MED ORDER — INSULIN ASPART 100 UNIT/ML IJ SOLN
2.0000 [IU] | Freq: Once | INTRAMUSCULAR | Status: AC
  Administered 2021-02-28: 2 [IU] via SUBCUTANEOUS

## 2021-02-28 MED ORDER — HYDROCORTISONE 10 MG PO TABS
10.0000 mg | ORAL_TABLET | ORAL | Status: DC
  Administered 2021-03-01 – 2021-03-04 (×4): 10 mg via ORAL
  Filled 2021-02-28 (×4): qty 1

## 2021-02-28 NOTE — Progress Notes (Signed)
Daily Obstetrics Consult Note  Admission Date: 12/16/2020 Current Date: 02/28/2021 12:17 PM  Ashley Camacho is a 30 y.o. G2P0202 POD#10 s/p repeat low transverse c-section via pfannenstiela incision and bilateral salpingectomy@ [redacted]w[redacted]d, admitted for multiple medical issues.  Post op course c/b suprapubic hematoma followed expectantly.   Overnight/24hr events:  None  Subjective:  Feels well. Still meeting post op goals but notes ongoing cramping. She is voiding spontaneously, ambulatory, passing gas and BM.   Objective:     Current Vital Signs 24h Vital Sign Ranges  T 97.8 F (36.6 C) Temp  Avg: 97.8 F (36.6 C)  Min: 97.3 F (36.3 C)  Max: 98.4 F (36.9 C)  BP (!) 145/63 BP  Min: 144/64  Max: 156/89  HR 88 Pulse  Avg: 85  Min: 81  Max: 88  RR 17 Resp  Avg: 17  Min: 16  Max: 18  SaO2 98 % Room Air SpO2  Avg: 99.1 %  Min: 98 %  Max: 100 %       24 Hour I/O Current Shift I/O  Time Ins Outs 10/07 0701 - 10/08 0700 In: 386 [P.O.:386] Out: 2825 [Urine:2825] 10/08 0701 - 10/08 1900 In: 530 [P.O.:530] Out: 1300 [Urine:1300]   Patient Vitals for the past 24 hrs:  BP Temp Temp src Pulse Resp SpO2  02/28/21 1115 (!) 145/63 97.8 F (36.6 C) Oral 88 17 98 %  02/28/21 0809 (!) 148/64 98.4 F (36.9 C) Oral 86 18 --  02/28/21 0442 (!) 151/73 98.1 F (36.7 C) Oral 88 18 99 %  02/27/21 2346 (!) 144/64 97.9 F (36.6 C) Oral 84 16 99 %  02/27/21 1938 (!) 153/74 97.7 F (36.5 C) Oral 81 16 99 %  02/27/21 1845 -- -- -- -- -- 100 %  02/27/21 1844 (!) 149/72 -- -- 87 -- --  02/27/21 1755 (!) 153/67 97.7 F (36.5 C) Oral 86 16 99 %  02/27/21 1734 (!) 156/89 -- -- 83 -- --  02/27/21 1229 (!) 152/63 (!) 97.3 F (36.3 C) Oral 82 18 100 %     Physical exam: General: Well nourished, well developed female in no acute distress.  Abdomen: (stable). incision c/d/I, +BS; some old blood that is dried and out of the incision which is likely the hematoma coming through. No e/o separation,  infection or breakdown of the incision. Minimally and appropriately ttp  Cardiovascular: S1, S2 normal, no murmur, rub or gallop, regular rate and rhythm Respiratory: CTAB Extremities: no clubbing, cyanosis or edema Skin: Warm and dry   Labs:  Recent Labs  Lab 02/24/21 0327 02/25/21 0421 02/26/21 1215 02/27/21 1915 02/28/21 0437  WBC 18.3* 14.2* 12.9* 13.4* 12.1*  HGB 7.4* 7.5* 7.4* 8.0* 7.9*  HCT 22.0* 22.4* 23.0* 25.1* 24.2*  PLT 186 235 260 307 308  MCV 87.6 88.9 89.5 90.9 90.3  MCH 29.5 29.8 28.8 29.0 29.5  MCHC 33.6 33.5 32.2 31.9 32.6  RDW 16.8* 16.5* 15.8* 15.1 15.1  LYMPHSABS  --   --  0.8  --   --   MONOABS  --   --  0.5  --   --   EOSABS  --   --  0.0  --   --   BASOSABS  --   --  0.0  --   --      Recent Labs  Lab 02/23/21 0447 02/23/21 1600 02/24/21 0327 02/25/21 0421 02/26/21 1215 02/27/21 0412 02/28/21 0437  NA 132*  --  134* 135  138 139 139  K 5.4*   < > 5.4* 4.9 4.3 4.4 4.6  CL 101  --  102 99 102 101 99  CO2 24  --  24 27 29  32 32  GLUCOSE 76  --  199* 238* 255* 326* 214*  BUN 54*  --  52* 57* 57* 61* 60*  CREATININE 1.66*  --  1.47* 1.46* 1.44* 1.39* 1.35*  CALCIUM 7.8*  --  8.1* 8.6* 8.5* 8.3* 8.5*  AST 19  --   --   --   --   --  23  ALT 23  --   --   --   --   --  23  ALKPHOS 53  --   --   --   --   --  102  BILITOT 0.6  --   --   --   --   --  1.0  ALBUMIN 1.7*  --  1.9* 2.0*  --   --  2.1*  MG 2.2  --  2.5* 2.3  --   --   --   BNP 448.7*  --   --  1,024.1*  --   --  715.6*   < > = values in this interval not displayed.    Radiology  No new imaging  Assessment & Plan:  Patient stable *Postpartum/post op: doing well. We will continue to check on her incision daily while she is inpatient.  *Norvasc increased to 5mg  overnight  Appreciate care with Hospitalist team for her other multiple issues.   [facility] for (Faculty Practice) GYN Consult Phone: [phone] (M-F, 0800-1700) &  [phone]  (Off hours, weekends, holidays)

## 2021-02-28 NOTE — Progress Notes (Signed)
Progress Note  Patient Name: Ashley Camacho Date of Encounter: 02/28/2021  [facility] Cardiologist: Thurmon Fair, MD   Subjective   Denies dyspnea at rest, while recumbent or with ambulation.  Has edema to the hips bilaterally, soft and pitting. Constant oozing at her incision site.  Hemoglobin staying steady just around 8. Creatinine stable around 1.3, elevated BUN around 60.  Severely hypoalbuminemic BNP has decreased from 1000 to roughly 700 over the last 3 days (baseline around 100).  Inpatient Medications    Scheduled Meds:  sodium chloride   Intravenous Once   alteplase  2 mg Intracatheter Once   amLODipine  5 mg Oral Daily   Chlorhexidine Gluconate Cloth  6 each Topical Daily   cholecalciferol  1,000 Units Oral Daily   famotidine  20 mg Oral BID   furosemide  60 mg Intravenous Q12H   hydrocortisone  15 mg Oral Q24H   And   hydrocortisone  10 mg Oral Q24H   insulin aspart  0-5 Units Subcutaneous QHS   insulin aspart  0-9 Units Subcutaneous TID WC   insulin aspart  4 Units Subcutaneous TID WC   insulin glargine-yfgn  15 Units Subcutaneous Daily   measles, mumps & rubella vaccine  0.5 mL Subcutaneous Once   pantoprazole  40 mg Oral Daily   prenatal multivitamin  1 tablet Oral Q1200   simethicone  80 mg Oral TID PC   sodium chloride flush  10-40 mL Intracatheter Q12H   Tdap  0.5 mL Intramuscular Once   Continuous Infusions:  PRN Meds: acetaminophen, bisacodyl, coconut oil, witch hazel-glycerin **AND** dibucaine, diphenhydrAMINE, hydrALAZINE, HYDROmorphone (DILAUDID) injection, menthol-cetylpyridinium, metoCLOPramide, oxyCODONE, senna-docusate, simethicone, sodium chloride, zolpidem   Vital Signs    Vitals:   02/27/21 1938 02/27/21 2346 02/28/21 0442 02/28/21 0809  BP: (!) 153/74 (!) 144/64 (!) 151/73 (!) 148/64  Pulse: 81 84 88 86  Resp: 16 16 18 18   Temp: 97.7 F (36.5 C) 97.9 F (36.6 C) 98.1 F (36.7 C) 98.4 F (36.9 C)  TempSrc: Oral Oral Oral  Oral  SpO2: 99% 99% 99%   Weight:      Height:        Intake/Output Summary (Last 24 hours) at 02/28/2021 1011 Last data filed at 02/28/2021 0855 Gross per 24 hour  Intake 446 ml  Output 2675 ml  Net -2229 ml   Last 3 Weights 02/26/2021 02/08/2021 01/26/2021  Weight (lbs) 153 lb 3.2 oz 167 lb 12.8 oz 177 lb 9.6 oz  Weight (kg) 69.491 kg 76.114 kg 80.559 kg      Telemetry    N/a - Personally Reviewed  ECG    No new tracing- Personally Reviewed  Physical Exam  Appears well.  Pale GEN: No acute distress.   Neck: No JVD Cardiac: RRR, no murmurs, rubs, or gallops.  Respiratory: Clear to auscultation bilaterally. GI: Soft, nontender, non-distended  MS: 3+ soft pitting edema to the hips bilaterally; No deformity. Neuro:  Nonfocal  Psych: Normal affect   Labs    High Sensitivity Troponin:  No results for input(s): TROPONINIHS in the last 720 hours.   Chemistry Recent Labs  Lab 02/23/21 0447 02/23/21 1600 02/24/21 0327 02/25/21 0421 02/26/21 1215 02/27/21 0412 02/28/21 0437  NA 132*  --  134* 135 138 139 139  K 5.4*   < > 5.4* 4.9 4.3 4.4 4.6  CL 101  --  102 99 102 101 99  CO2 24  --  24 27 29  32 32  GLUCOSE 76  --  199* 238* 255* 326* 214*  BUN 54*  --  52* 57* 57* 61* 60*  CREATININE 1.66*  --  1.47* 1.46* 1.44* 1.39* 1.35*  CALCIUM 7.8*  --  8.1* 8.6* 8.5* 8.3* 8.5*  MG 2.2  --  2.5* 2.3  --   --   --   PROT 4.4*  --   --   --   --   --   --   ALBUMIN 1.7*  --  1.9* 2.0*  --   --   --   AST 19  --   --   --   --   --   --   ALT 23  --   --   --   --   --   --   ALKPHOS 53  --   --   --   --   --   --   BILITOT 0.6  --   --   --   --   --   --   GFRNONAA 43*  --  49* 50* 50* 53* 55*  ANIONGAP 7  --  8 9 7 6 8    < > = values in this interval not displayed.    Lipids No results for input(s): CHOL, TRIG, HDL, LABVLDL, LDLCALC, CHOLHDL in the last 168 hours.  Hematology Recent Labs  Lab 02/26/21 1215 02/27/21 1915 02/28/21 0437  WBC 12.9* 13.4* 12.1*  RBC  2.57* 2.76* 2.68*  HGB 7.4* 8.0* 7.9*  HCT 23.0* 25.1* 24.2*  MCV 89.5 90.9 90.3  MCH 28.8 29.0 29.5  MCHC 32.2 31.9 32.6  RDW 15.8* 15.1 15.1  PLT 260 307 308   Thyroid No results for input(s): TSH, FREET4 in the last 168 hours.  BNP Recent Labs  Lab 02/23/21 0447 02/25/21 0421 02/28/21 0437  BNP 448.7* 1,024.1* 715.6*    DDimer No results for input(s): DDIMER in the last 168 hours.   Radiology    ECHOCARDIOGRAM COMPLETE  Result Date: 02/27/2021    ECHOCARDIOGRAM REPORT   Patient Name:   Ashley Camacho Date of Exam: 02/27/2021 Medical Rec #:  04/29/2021       Height:       61.0 in Accession #:    [id number]      Weight:       153.2 lb Date of Birth:  [birth year]      BSA:          1.686 m Patient Age:    29 years        BP:           154/72 mmHg Patient Gender: F               HR:           85 bpm. Exam Location:  Inpatient Procedure: 2D Echo, Cardiac Doppler and Color Doppler Indications:    CHF-Acute Systolic I50.21  History:        Patient has prior history of Echocardiogram examinations, most                 recent 12/28/2020. Risk Factors:Diabetes and Hypertension. Edema.                 Post Cesarean at [redacted] weeks gestation. One prior child.  Sonographer:    02/27/2021 RDCS Referring Phys: [id number] PAULA V ROSS IMPRESSIONS  1. Left ventricular ejection fraction, by estimation, is 50 to 55%. The left ventricle  has low normal function. The left ventricle demonstrates global hypokinesis. Left ventricular diastolic parameters were normal.  2. Right ventricular systolic function is normal. The right ventricular size is normal. There is normal pulmonary artery systolic pressure. The estimated right ventricular systolic pressure is 35.7 mmHg.  3. A small pericardial effusion is present. The pericardial effusion is posterior to the left ventricle. There is no evidence of cardiac tamponade.  4. The mitral valve is normal in structure. Moderate mitral valve regurgitation. No evidence of mitral stenosis.   5. The aortic valve is normal in structure. Aortic valve regurgitation is trivial. No aortic stenosis is present.  6. Pulmonic valve regurgitation is moderate.  7. The inferior vena cava is normal in size with greater than 50% respiratory variability, suggesting right atrial pressure of 3 mmHg. Comparison(s): No significant change from prior study. Prior images reviewed side by side. FINDINGS  Left Ventricle: Left ventricular ejection fraction, by estimation, is 50 to 55%. The left ventricle has low normal function. The left ventricle demonstrates global hypokinesis. The left ventricular internal cavity size was normal in size. There is no left ventricular hypertrophy. Left ventricular diastolic parameters were normal. Right Ventricle: The right ventricular size is normal. No increase in right ventricular wall thickness. Right ventricular systolic function is normal. There is normal pulmonary artery systolic pressure. The tricuspid regurgitant velocity is 2.86 m/s, and  with an assumed right atrial pressure of 3 mmHg, the estimated right ventricular systolic pressure is 35.7 mmHg. Left Atrium: Left atrial size was normal in size. Right Atrium: Right atrial size was normal in size. Pericardium: A small pericardial effusion is present. The pericardial effusion is posterior to the left ventricle. There is no evidence of cardiac tamponade. Mitral Valve: The mitral valve is normal in structure. Moderate mitral valve regurgitation, with posteriorly-directed jet. No evidence of mitral valve stenosis. Tricuspid Valve: The tricuspid valve is normal in structure. Tricuspid valve regurgitation is mild . No evidence of tricuspid stenosis. Aortic Valve: The aortic valve is normal in structure. Aortic valve regurgitation is trivial. No aortic stenosis is present. Aortic valve mean gradient measures 3.0 mmHg. Aortic valve peak gradient measures 6.1 mmHg. Aortic valve area, by VTI measures 1.47 cm. Pulmonic Valve: The pulmonic  valve was normal in structure. Pulmonic valve regurgitation is moderate. No evidence of pulmonic stenosis. Aorta: The aortic root is normal in size and structure. Venous: The inferior vena cava is normal in size with greater than 50% respiratory variability, suggesting right atrial pressure of 3 mmHg. IAS/Shunts: No atrial level shunt detected by color flow Doppler.  LEFT VENTRICLE PLAX 2D LVIDd:         4.10 cm   Diastology LVIDs:         3.20 cm   LV e' medial:    8.93 cm/s LV PW:         1.00 cm   LV E/e' medial:  16.5 LV IVS:        0.80 cm   LV e' lateral:   12.20 cm/s LVOT diam:     1.70 cm   LV E/e' lateral: 12.0 LV SV:         37 LV SV Index:   22 LVOT Area:     2.27 cm  RIGHT VENTRICLE RV Basal diam:  3.10 cm LEFT ATRIUM             Index        RIGHT ATRIUM  Index LA diam:        4.00 cm 2.37 cm/m   RA Area:     15.40 cm LA Vol (A2C):   43.1 ml 25.56 ml/m  RA Volume:   37.50 ml  22.24 ml/m LA Vol (A4C):   34.9 ml 20.69 ml/m LA Biplane Vol: 39.5 ml 23.42 ml/m  AORTIC VALVE AV Area (Vmax):    1.46 cm AV Area (Vmean):   1.42 cm AV Area (VTI):     1.47 cm AV Vmax:           123.00 cm/s AV Vmean:          84.700 cm/s AV VTI:            0.252 m AV Peak Grad:      6.1 mmHg AV Mean Grad:      3.0 mmHg LVOT Vmax:         79.10 cm/s LVOT Vmean:        53.100 cm/s LVOT VTI:          0.163 m LVOT/AV VTI ratio: 0.65  AORTA Ao Root diam: 2.70 cm Ao Asc diam:  2.50 cm MITRAL VALVE                TRICUSPID VALVE MV Area (PHT): 5.54 cm     TR Peak grad:   32.7 mmHg MV Decel Time: 137 msec     TR Vmax:        286.00 cm/s MV E velocity: 147.00 cm/s MV A velocity: 102.00 cm/s  SHUNTS MV E/A ratio:  1.44         Systemic VTI:  0.16 m                             Systemic Diam: 1.70 cm Donato Schultz MD Electronically signed by Donato Schultz MD Signature Date/Time: 02/27/2021/11:47:22 AM    Final     Cardiac Studies   Echo 02/27/2021 above unchanged from previous 12/28/2020  Patient Profile     30 y.o.  female with history of type 1 diabetes mellitus heart failure with mildly reduced left ventricular systolic function of uncertain etiology (possibly nutritional due to severe emesis during pregnancy), adrenal insufficiency, severe hypoalbuminemia, improving following delivery of a 34-week daughter.  Assessment & Plan    Diastolic CHF with EF 50-55%, but large part of her hypervolemia is related to severe nutritional hypoalbuminemia.  BNP is still above baseline, continue diuretics while closely monitoring renal function.  SGLT2 inhibitors are contraindicated with type 1 diabetes.  Holding off on ARB and spironolactone with abnormal renal parameters at this time. HTN: Amlodipine has just been started.  No particular benefit from any certain antihypertensive with her near normal LVEF, so this is is better choices any. Anemia: She did require transfusion a few days ago.  Hemoglobin is stable, but she continues to have oozing at the surgical site.  I recommend stopping the enoxaparin.  I think her risk of recurrent DVT is low.  She did have a small upper extremity DVT that has recanalized.  She is very mobile, walking back and forth to the NICU every day.  She has already received enoxaparin dose this morning.  Can restart in prophylactic dose tomorrow.     For questions or updates, please contact [facility] Please consult [email or web address] for contact info under        Signed, Thurmon Fair, MD  02/28/2021, 10:11 AM

## 2021-02-28 NOTE — Progress Notes (Signed)
PROGRESS NOTE    Ashley Camacho  FTD:[id number] DOB: 1991-01-20 DOA: 12/16/2020 PCP: Hildred Laser, MD    No chief complaint on file.   Brief Narrative:  30 y.o. G2P0101 with a history of T1DM, chronic HFrEF and recent hospitalization at [facility] on 6/30 for N/V and rhinovirus pneumonia requiring transfer to [facility]  She was discharged home from [facility] on 7/20 only to return on 7/22 with starvation ketoacidosis in the setting of nausea and vomiting likely due to uncontrolled DM-1 and gastroparesis, and brachial vein DVT.She was a started on TPN through PICC line and transferred to [facility] for cortrak and TF initiation.    Hospital course noteworthy of systolic CHF exacerbation (on IV diuretics), AI (p.o. Cortef), ABLA, hematemesis in the setting of grade C esophagitis on EGD, hematuria (seems to have resolved), moderate right hydronephrosis (improved), Enterococcus faecalis UTI (completed treatment), acute urinary retention (resolved)...   Patient underwent cesarean section on 9/27.  Had postop blood loss with large bladder flap hematoma measuring requiring blood transfusions.  CTA did not show active extravasation.  H&H seems to be stable now. Pt seen, diuresing well with increased dose of IV lasix .  Continue the same, repeat ECHO.    Assessment & Plan:   Active Problems:   Delivery with history of C-section   Acute kidney injury (HCC)   Hyperemesis   DKA, type 1, not at goal [facility])   Acute deep vein thrombosis (DVT) of brachial vein of right upper extremity (HCC)   Vitreous hemorrhage of right eye (HCC)   Gastroparesis due to DM (HCC)   Diabetic retinopathy (HCC)   Acute on chronic combined systolic and diastolic CHF (congestive heart failure) (HCC)   Type 1 diabetes mellitus affecting pregnancy in second trimester, antepartum   HFrEF (heart failure with reduced ejection fraction) (HCC)   Anemia during pregnancy   Pre-existing type 1 diabetes mellitus during pregnancy in third trimester    History of preterm delivery, currently pregnant   Leg edema   Acute esophagitis   S/P cesarean section   Postoperative hematoma involving genitourinary system following genitourinary procedure  Acute systolic heart failure with improved EF/volume overload  Pt on 60 mg BID of IV lasix, and diuresing well.   Strict intake and output and fluid restriction to .  Creatinine improving to 1.3.she has diuresed about 15.6 lit since admission.  Appreciate cardiology input . Reviewed the repeat echocardiogram.     Postsurgical anemia of blood loss/hematoma Hemoglobin remains around 7.  Transfuse to keep hemoglobin greater than 7.     Acute urinary retention/bilateral hydronephrosis Resolved. Creatinine stable around 1.3.   Uncontrolled type 1 diabetes with hyperglycemia and hypoglycemia Increasing Semglee to 15 units daily and continue with sliding scale insulin. CBG's continue to remain between 300 to 180.  Pt plans to go back on the insulin pump post discharge.  CBG (last 3)  Recent Labs    02/28/21 0004 02/28/21 0439 02/28/21 0812  GLUCAP 226* 210* 174*   Plan to transition to insulin pump on Monday.    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  She will need close follow-up with endocrinology postdischarge.   Hyperkalemia Resolved  Hyponatremia probably from hypervolemia/fluid overload Resolved.     Hypertension  Uncontrolled hypertension, started weaning the solu cortef.  Cardiology added 2.5 of amlodipine.    Right  brachial DVT Recannulation of the blood vessel.    GERD .  Continue with PPI.   AKI/azotemia Patient's baseline creatinine less than 1 currently stable around 1.3 with IV diuresis.   Continue to monitor.  Leukocytosis  Improving.   IUP: S/P C section delivery at  34 weeks.    Hypokalemia and hypomagnesemia:  Replaced.    Enterococcus faecalis UTI:  Completed the course of treatment.    Acute thrombocytopenia:  Resolved.    DVT prophylaxis: SCDs Code Status: full code.  Family Communication: none at bedside.  Disposition:   Status is: Inpatient  Remains inpatient appropriate because:IV treatments appropriate due to intensity of illness or inability to take PO        Antimicrobials: none.    Subjective:  She reports having persistent pain in the abdomen.   Objective: Vitals:   02/27/21 2346 02/28/21 0442 02/28/21 0809 02/28/21 1115  BP: (!) 144/64 (!) 151/73 (!) 148/64 (!) 145/63  Pulse: 84 88 86 88  Resp: 16 18 18 17   Temp: 97.9 F (36.6 C) 98.1 F (36.7 C) 98.4 F (36.9 C) 97.8 F (36.6 C)  TempSrc: Oral Oral Oral Oral  SpO2: 99% 99%  98%  Weight:      Height:        Intake/Output Summary (Last 24 hours) at 02/28/2021 1558 Last data filed at 02/28/2021 1542 Gross per 24 hour  Intake 1156 ml  Output 3375 ml  Net -2219 ml    Filed Weights   01/26/21 0530 02/08/21 1157 02/26/21 1448  Weight: 80.6 kg 76.1 kg 69.5 kg    Examination: General exam: alert and comfortable.  Respiratory system: Clear to auscultation, no wheezing heard.  Cardiovascular system: S1 & S2 heard, RRR no JVD.  Gastrointestinal system: Abdomen is soft, mildly tender around the incision area. Bowel sounds wnl.  Central nervous system: alert and oriented.  Extremities: leg edema improving.  Skin: c section incision area , indurated, and erythematous. Improving pain.  Psychiatry:  Mood & affect appropriate.      Data Reviewed: I have personally reviewed following labs and imaging studies  CBC: Recent Labs  Lab 02/24/21 0327 02/25/21 0421 02/26/21 1215 02/27/21 1915 02/28/21 0437  WBC 18.3* 14.2* 12.9* 13.4* 12.1*  NEUTROABS  --   --  11.5*  --   --   HGB 7.4* 7.5* 7.4* 8.0* 7.9*  HCT 22.0* 22.4* 23.0* 25.1* 24.2*  MCV 87.6  88.9 89.5 90.9 90.3  PLT 186 235 260 307 308     Basic Metabolic Panel: Recent Labs  Lab 02/23/21 0447 02/23/21 1600 02/24/21 0327 02/25/21 0421 02/26/21 1215 02/27/21 0412 02/28/21 0437  NA 132*  --  134* 135 138 139 139  K 5.4*   < > 5.4* 4.9 4.3 4.4 4.6  CL 101  --  102 99 102 101 99  CO2 24  --  24 27 29  32 32  GLUCOSE 76  --  199* 238* 255* 326* 214*  BUN 54*  --  52* 57* 57* 61* 60*  CREATININE 1.66*  --  1.47* 1.46* 1.44* 1.39* 1.35*  CALCIUM 7.8*  --  8.1* 8.6* 8.5* 8.3* 8.5*  MG 2.2  --  2.5* 2.3  --   --   --   PHOS 4.9*  --  4.4 4.1  --   --   --    < > = values in this interval not displayed.     GFR: Estimated Creatinine Clearance: 54.8 mL/min (A) (  by C-G formula based on SCr of 1.35 mg/dL (H)).  Liver Function Tests: Recent Labs  Lab 02/23/21 0447 02/24/21 0327 02/25/21 0421 02/28/21 0437  AST 19  --   --  23  ALT 23  --   --  23  ALKPHOS 53  --   --  102  BILITOT 0.6  --   --  1.0  PROT 4.4*  --   --  5.2*  ALBUMIN 1.7* 1.9* 2.0* 2.1*     CBG: Recent Labs  Lab 02/24/21 2057 02/25/21 0119 02/28/21 0004 02/28/21 0439 02/28/21 0812  GLUCAP 243* 185* 226* 210* 174*      No results found for this or any previous visit (from the past 240 hour(s)).        Radiology Studies: ECHOCARDIOGRAM COMPLETE  Result Date: 02/27/2021    ECHOCARDIOGRAM REPORT   Patient Name:   TALASIA SAULTER Date of Exam: 02/27/2021 Medical Rec #:  [id number]       Height:       61.0 in Accession #:    [id number]      Weight:       153.2 lb Date of Birth:  Mar 24, 1991      BSA:          1.686 m Patient Age:    29 years        BP:           154/72 mmHg Patient Gender: F               HR:           85 bpm. Exam Location:  Inpatient Procedure: 2D Echo, Cardiac Doppler and Color Doppler Indications:    CHF-Acute Systolic I50.21  History:        Patient has prior history of Echocardiogram examinations, most                 recent 12/28/2020. Risk Factors:Diabetes and  Hypertension. Edema.                 Post Cesarean at [redacted] weeks gestation. One prior child.  Sonographer:    Roosvelt Maser RDCS Referring Phys: [id number] PAULA V ROSS IMPRESSIONS  1. Left ventricular ejection fraction, by estimation, is 50 to 55%. The left ventricle has low normal function. The left ventricle demonstrates global hypokinesis. Left ventricular diastolic parameters were normal.  2. Right ventricular systolic function is normal. The right ventricular size is normal. There is normal pulmonary artery systolic pressure. The estimated right ventricular systolic pressure is 35.7 mmHg.  3. A small pericardial effusion is present. The pericardial effusion is posterior to the left ventricle. There is no evidence of cardiac tamponade.  4. The mitral valve is normal in structure. Moderate mitral valve regurgitation. No evidence of mitral stenosis.  5. The aortic valve is normal in structure. Aortic valve regurgitation is trivial. No aortic stenosis is present.  6. Pulmonic valve regurgitation is moderate.  7. The inferior vena cava is normal in size with greater than 50% respiratory variability, suggesting right atrial pressure of 3 mmHg. Comparison(s): No significant change from prior study. Prior images reviewed side by side. FINDINGS  Left Ventricle: Left ventricular ejection fraction, by estimation, is 50 to 55%. The left ventricle has low normal function. The left ventricle demonstrates global hypokinesis. The left ventricular internal cavity size was normal in size. There is no left ventricular hypertrophy. Left ventricular diastolic parameters were normal. Right Ventricle: The right ventricular size is  normal. No increase in right ventricular wall thickness. Right ventricular systolic function is normal. There is normal pulmonary artery systolic pressure. The tricuspid regurgitant velocity is 2.86 m/s, and  with an assumed right atrial pressure of 3 mmHg, the estimated right ventricular systolic pressure is 35.7  mmHg. Left Atrium: Left atrial size was normal in size. Right Atrium: Right atrial size was normal in size. Pericardium: A small pericardial effusion is present. The pericardial effusion is posterior to the left ventricle. There is no evidence of cardiac tamponade. Mitral Valve: The mitral valve is normal in structure. Moderate mitral valve regurgitation, with posteriorly-directed jet. No evidence of mitral valve stenosis. Tricuspid Valve: The tricuspid valve is normal in structure. Tricuspid valve regurgitation is mild . No evidence of tricuspid stenosis. Aortic Valve: The aortic valve is normal in structure. Aortic valve regurgitation is trivial. No aortic stenosis is present. Aortic valve mean gradient measures 3.0 mmHg. Aortic valve peak gradient measures 6.1 mmHg. Aortic valve area, by VTI measures 1.47 cm. Pulmonic Valve: The pulmonic valve was normal in structure. Pulmonic valve regurgitation is moderate. No evidence of pulmonic stenosis. Aorta: The aortic root is normal in size and structure. Venous: The inferior vena cava is normal in size with greater than 50% respiratory variability, suggesting right atrial pressure of 3 mmHg. IAS/Shunts: No atrial level shunt detected by color flow Doppler.  LEFT VENTRICLE PLAX 2D LVIDd:         4.10 cm   Diastology LVIDs:         3.20 cm   LV e' medial:    8.93 cm/s LV PW:         1.00 cm   LV E/e' medial:  16.5 LV IVS:        0.80 cm   LV e' lateral:   12.20 cm/s LVOT diam:     1.70 cm   LV E/e' lateral: 12.0 LV SV:         37 LV SV Index:   22 LVOT Area:     2.27 cm  RIGHT VENTRICLE RV Basal diam:  3.10 cm LEFT ATRIUM             Index        RIGHT ATRIUM           Index LA diam:        4.00 cm 2.37 cm/m   RA Area:     15.40 cm LA Vol (A2C):   43.1 ml 25.56 ml/m  RA Volume:   37.50 ml  22.24 ml/m LA Vol (A4C):   34.9 ml 20.69 ml/m LA Biplane Vol: 39.5 ml 23.42 ml/m  AORTIC VALVE AV Area (Vmax):    1.46 cm AV Area (Vmean):   1.42 cm AV Area (VTI):     1.47  cm AV Vmax:           123.00 cm/s AV Vmean:          84.700 cm/s AV VTI:            0.252 m AV Peak Grad:      6.1 mmHg AV Mean Grad:      3.0 mmHg LVOT Vmax:         79.10 cm/s LVOT Vmean:        53.100 cm/s LVOT VTI:          0.163 m LVOT/AV VTI ratio: 0.65  AORTA Ao Root diam: 2.70 cm Ao Asc diam:  2.50 cm MITRAL VALVE  TRICUSPID VALVE MV Area (PHT): 5.54 cm     TR Peak grad:   32.7 mmHg MV Decel Time: 137 msec     TR Vmax:        286.00 cm/s MV E velocity: 147.00 cm/s MV A velocity: 102.00 cm/s  SHUNTS MV E/A ratio:  1.44         Systemic VTI:  0.16 m                             Systemic Diam: 1.70 cm Donato Schultz MD Electronically signed by Donato Schultz MD Signature Date/Time: 02/27/2021/11:47:22 AM    Final         Scheduled Meds:  sodium chloride   Intravenous Once   alteplase  2 mg Intracatheter Once   amLODipine  5 mg Oral Daily   Chlorhexidine Gluconate Cloth  6 each Topical Daily   cholecalciferol  1,000 Units Oral Daily   famotidine  20 mg Oral BID   furosemide  60 mg Intravenous Q12H   [START ON 03/01/2021] hydrocortisone  10 mg Oral Q24H   And   hydrocortisone  5 mg Oral Q24H   insulin aspart  0-5 Units Subcutaneous QHS   insulin aspart  0-9 Units Subcutaneous TID WC   insulin aspart  4 Units Subcutaneous TID WC   insulin glargine-yfgn  15 Units Subcutaneous Daily   measles, mumps & rubella vaccine  0.5 mL Subcutaneous Once   pantoprazole  40 mg Oral Daily   prenatal multivitamin  1 tablet Oral Q1200   simethicone  80 mg Oral TID PC   sodium chloride flush  10-40 mL Intracatheter Q12H   Tdap  0.5 mL Intramuscular Once   Continuous Infusions:   LOS: 74 days        Kathlen Mody, MD [facility]   To contact the attending provider between 7A-7P or the covering provider during after hours 7P-7A, please log into the web site [email or web address] and access using universal [place] password for that web site. If you do not have the password, please call  the hospital operator.  02/28/2021, 3:58 PM

## 2021-03-01 LAB — BASIC METABOLIC PANEL
Anion gap: 5 (ref 5–15)
BUN: 60 mg/dL — ABNORMAL HIGH (ref 6–20)
CO2: 34 mmol/L — ABNORMAL HIGH (ref 22–32)
Calcium: 8.2 mg/dL — ABNORMAL LOW (ref 8.9–10.3)
Chloride: 97 mmol/L — ABNORMAL LOW (ref 98–111)
Creatinine, Ser: 1.26 mg/dL — ABNORMAL HIGH (ref 0.44–1.00)
GFR, Estimated: 59 mL/min — ABNORMAL LOW (ref >60)
Glucose, Bld: 329 mg/dL — ABNORMAL HIGH (ref 70–99)
Potassium: 4.3 mmol/L (ref 3.5–5.1)
Sodium: 136 mmol/L (ref 135–145)

## 2021-03-01 LAB — CBC
HCT: 24.7 % — ABNORMAL LOW (ref 36.0–46.0)
Hemoglobin: 8 g/dL — ABNORMAL LOW (ref 12.0–15.0)
MCH: 29.6 pg (ref 26.0–34.0)
MCHC: 32.4 g/dL (ref 30.0–36.0)
MCV: 91.5 fL (ref 80.0–100.0)
Platelets: 314 10*3/uL (ref 150–400)
RBC: 2.7 MIL/uL — ABNORMAL LOW (ref 3.87–5.11)
RDW: 15.1 % (ref 11.5–15.5)
WBC: 10 10*3/uL (ref 4.0–10.5)
nRBC: 0 % (ref 0.0–0.2)

## 2021-03-01 LAB — GLUCOSE, CAPILLARY
Glucose-Capillary: 319 mg/dL — ABNORMAL HIGH (ref 70–99)
Glucose-Capillary: 39 mg/dL — CL (ref 70–99)
Glucose-Capillary: 57 mg/dL — ABNORMAL LOW (ref 70–99)

## 2021-03-01 MED ORDER — INSULIN ASPART 100 UNIT/ML IJ SOLN
6.0000 [IU] | Freq: Three times a day (TID) | INTRAMUSCULAR | Status: DC
  Administered 2021-03-01: 6 [IU] via SUBCUTANEOUS

## 2021-03-01 MED ORDER — INSULIN ASPART 100 UNIT/ML IJ SOLN
0.0000 [IU] | Freq: Three times a day (TID) | INTRAMUSCULAR | Status: DC
  Administered 2021-03-02: 2 [IU] via SUBCUTANEOUS

## 2021-03-01 MED ORDER — INSULIN GLARGINE-YFGN 100 UNIT/ML ~~LOC~~ SOLN
18.0000 [IU] | Freq: Every day | SUBCUTANEOUS | Status: DC
  Administered 2021-03-01 – 2021-03-02 (×2): 18 [IU] via SUBCUTANEOUS
  Filled 2021-03-01 (×2): qty 0.18

## 2021-03-01 MED ORDER — INSULIN ASPART 100 UNIT/ML IJ SOLN
5.0000 [IU] | Freq: Once | INTRAMUSCULAR | Status: AC
  Administered 2021-03-01: 5 [IU] via SUBCUTANEOUS

## 2021-03-01 NOTE — Progress Notes (Addendum)
Hypoglycemic Event  CBG: 53 with freestyle libre, 39 with fingerstick CBG at 1738  Treatment: 8 oz juice/soda  Symptoms: Shaky  Follow-up CBG: Time:1800 CBG Result:57  Possible Reasons for Event: Medication regimen: increase in insulin dosages  Comments/MD notified:Dr. Othella Boyer Libre recheck 86 at [address]

## 2021-03-01 NOTE — Progress Notes (Signed)
Daily Obstetrics Consult Note  Admission Date: 12/16/2020 Current Date: 03/01/2021 6:59 AM  Ashley Camacho Ashley Camacho is a 30 y.o. G2P0202 POD#11 s/p repeat low transverse c-section via pfannenstiel incision and bilateral salpingectomy@ [redacted]w[redacted]d, admitted for multiple medical issues.  Post op course c/b suprapubic hematoma followed expectantly.   Overnight/24hr events:  None  Subjective:  Feels well. She is voiding spontaneously, ambulatory, passing gas and BM.   Objective:     Current Vital Signs 24h Vital Sign Ranges  T 98.4 F (36.9 C) Temp  Avg: 98.1 F (36.7 C)  Min: 97.7 F (36.5 C)  Max: 98.4 F (36.9 C)  BP (!) 149/64 BP  Min: 144/68  Max: 150/66  HR 88 Pulse  Avg: 88  Min: 86  Max: 91  RR 18 Resp  Avg: 17.7  Min: 17  Max: 18  SaO2 98 % Room Air SpO2  Avg: 97.7 %  Min: 97 %  Max: 98 %       24 Hour I/O Current Shift I/O  Time Ins Outs 10/08 0701 - 10/09 0700 In: 1435 [P.O.:1430; I.V.:5] Out: 3650 [Urine:3650] 10/08 1901 - 10/09 0700 In: 665 [P.O.:660; I.V.:5] Out: 1450 [Urine:1450]   Patient Vitals for the past 24 hrs:  BP Temp Temp src Pulse Resp SpO2  03/01/21 0325 (!) 149/64 98.4 F (36.9 C) Oral 88 18 98 %  02/28/21 2328 (!) 150/66 98.3 F (36.8 C) Oral 87 18 97 %  02/28/21 1936 (!) 144/68 98.1 F (36.7 C) Oral 91 18 --  02/28/21 1649 (!) 146/66 97.7 F (36.5 C) Oral 88 17 --  02/28/21 1115 (!) 145/63 97.8 F (36.6 C) Oral 88 17 98 %  02/28/21 0809 (!) 148/64 98.4 F (36.9 C) Oral 86 18 --     Physical exam: General: Well nourished, well developed female in no acute distress.  Abdomen: (stable). Incision dressing c/d/I with no new drainage on it from yesterday  Cardiovascular: S1, S2 normal, no murmur, rub or gallop, regular rate and rhythm Respiratory: CTAB Extremities: no clubbing, cyanosis or edema Skin: Warm and dry   Labs:  Recent Labs  Lab 02/25/21 0421 02/26/21 1215 02/27/21 1915 02/28/21 0437 03/01/21 0443  WBC 14.2* 12.9* 13.4* 12.1* 10.0   HGB 7.5* 7.4* 8.0* 7.9* 8.0*  HCT 22.4* 23.0* 25.1* 24.2* 24.7*  PLT 235 260 307 308 314  MCV 88.9 89.5 90.9 90.3 91.5  MCH 29.8 28.8 29.0 29.5 29.6  MCHC 33.5 32.2 31.9 32.6 32.4  RDW 16.5* 15.8* 15.1 15.1 15.1  LYMPHSABS  --  0.8  --   --   --   MONOABS  --  0.5  --   --   --   EOSABS  --  0.0  --   --   --   BASOSABS  --  0.0  --   --   --      Recent Labs  Lab 02/23/21 0447 02/23/21 1600 02/24/21 0327 02/25/21 0421 02/26/21 1215 02/27/21 0412 02/28/21 0437 03/01/21 0443  NA 132*  --  134* 135 138 139 139 136  K 5.4*   < > 5.4* 4.9 4.3 4.4 4.6 4.3  CL 101  --  102 99 102 101 99 97*  CO2 24  --  24 27 29  32 32 34*  GLUCOSE 76  --  199* 238* 255* 326* 214* 329*  BUN 54*  --  52* 57* 57* 61* 60* 60*  CREATININE 1.66*  --  1.47* 1.46* 1.44* 1.39*  1.35* 1.26*  CALCIUM 7.8*  --  8.1* 8.6* 8.5* 8.3* 8.5* 8.2*  AST 19  --   --   --   --   --  23  --   ALT 23  --   --   --   --   --  23  --   ALKPHOS 53  --   --   --   --   --  102  --   BILITOT 0.6  --   --   --   --   --  1.0  --   ALBUMIN 1.7*  --  1.9* 2.0*  --   --  2.1*  --   MG 2.2  --  2.5* 2.3  --   --   --   --   BNP 448.7*  --   --  1,024.1*  --   --  715.6*  --    < > = values in this interval not displayed.    Radiology  No new imaging  Assessment & Plan:  Patient stable *Postpartum/post op: doing well. We will continue to check on her incision daily while she is inpatient.  - At this point her pain is adequately controlled and her hemoglobin is stable. This is c/w improving hematoma. When medically stable, she is surgically stable for discharge.   Appreciate care with Hospitalist team for her other multiple issues.   Milas Hock [facility]ending [facility] [employer] (Faculty Practice) GYN Consult Phone: [phone] (M-F, 0800-1700) & [phone]  (Off hours, weekends, holidays)

## 2021-03-01 NOTE — Progress Notes (Signed)
Inpatient Diabetes Program Recommendations  AACE/ADA: New Consensus Statement on Inpatient Glycemic Control (2015)  Target Ranges:  Prepandial:   less than 140 mg/dL      Peak postprandial:   less than 180 mg/dL (1-2 hours)      Critically ill patients:  140 - 180 mg/dL   Lab Results  Component Value Date   GLUCAP 319 (H) 03/01/2021   HGBA1C 5.4 02/21/2021    Review of Glycemic Control  Diabetes history: DM1  Outpatient Diabetes medications:  Omnipod with Novolog insulin  Most recent insulin pump rates from [facility]: 0.05 units/hr= 1.2 units in 24 hours, 1 units drops blood sugar 100 mg/dL, 1 units per 30 grams of CHO Cortef 10 mg in am and 5 mg Q 1700  Current orders for Inpatient glycemic control: Semglee 15 QD, Novolog 0-9 TID and HS + 4 units TID   Inpatient Diabetes Program Recommendations:    Consider increasing Semglee to 16-17 units QD if steroids continue at 15 mg total per day.  Will follow closely.  Thank you. Ailene Ards, RD, LDN, CDE Inpatient Diabetes Coordinator [phone]

## 2021-03-01 NOTE — Progress Notes (Signed)
PROGRESS NOTE    Ashley Camacho  NWG:[id number] DOB: [birth year] DOA: 12/16/2020 PCP: Hildred Laser, MD    No chief complaint on file.   Brief Narrative:  30 y.o. G2P0101 with a history of T1DM, chronic HFrEF and recent hospitalization at [facility] on 6/30 for N/V and rhinovirus pneumonia requiring transfer to [facility].  She was discharged home from [facility] on 7/20 only to return on 7/22 with starvation ketoacidosis in the setting of nausea and vomiting likely due to uncontrolled DM-1 and gastroparesis, and brachial vein DVT.She was a started on TPN through PICC line and transferred to [facility] for cortrak and TF initiation.    Hospital course noteworthy of systolic CHF exacerbation (on IV diuretics), AI (p.o. Cortef), ABLA, hematemesis in the setting of grade C esophagitis on EGD, hematuria (seems to have resolved), moderate right hydronephrosis (improved), Enterococcus faecalis UTI (completed treatment), acute urinary retention (resolved)...   Patient underwent cesarean section on 9/27.  Had postop blood loss with large bladder flap hematoma measuring requiring blood transfusions.  CTA did not show active extravasation.  H&H seems to be stable now. Pt seen, diuresing well with increased dose of IV lasix . Echocardiogram reviewed with the patient.     Assessment & Plan:   Active Problems:   Delivery with history of C-section   Acute kidney injury (HCC)   Hyperemesis   DKA, type 1, not at goal [facility])   Acute deep vein thrombosis (DVT) of brachial vein of right upper extremity (HCC)   Vitreous hemorrhage of right eye (HCC)   Gastroparesis due to DM (HCC)   Diabetic retinopathy (HCC)   Acute on chronic combined systolic and diastolic CHF (congestive heart failure) (HCC)   Type 1 diabetes mellitus affecting pregnancy in second trimester, antepartum   HFrEF (heart failure with reduced ejection fraction) (HCC)   Anemia during pregnancy   Pre-existing type 1 diabetes mellitus during pregnancy in third  trimester   History of preterm delivery, currently pregnant   Leg edema   Acute esophagitis   S/P cesarean section   Postoperative hematoma involving genitourinary system following genitourinary procedure  Acute systolic heart failure with improved EF/volume overload  Pt on 60 mg BID of IV lasix, and diuresing well.   Strict intake and output and fluid restriction to .  Creatinine improving to 1.2. she has diuresed about 15. lit since admission.  Appreciate cardiology input.      Postsurgical anemia of blood loss/hematoma Improved hemoglobin to 8.  Transfuse to keep hemoglobin greater than 7.    Acute urinary retention/bilateral hydronephrosis Resolved. Creatinine stable around 1.2   Uncontrolled type 1 diabetes with hyperglycemia and hypoglycemia Increasing Semglee to 18 units daily and continue with sliding scale insulin. Increased novolog to 6 units TIDAC.  CBG's continue to remain between 300 to 180.  Pt plans to go back on the insulin pump post discharge.  CBG (last 3)  Recent Labs    02/28/21 0439 02/28/21 0812 03/01/21 0442  GLUCAP 210* 174* 319*    Plan to transition to insulin pump on Monday.    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  She will need close follow-up with endocrinology postdischarge.   Hyperkalemia Resolved  Hyponatremia probably from hypervolemia/fluid overload Resolved.     Hypertension  Bp parameters have improved. .  Cardiology added 2.5 of amlodipine.  Right brachial DVT Recannulation of the blood vessel.  Pt ambulating.    GERD .  Continue with PPI.   AKI/azotemia Patient's baseline creatinine less than 1 currently stable around 1.2  with IV diuresis.   Continue to monitor.  Leukocytosis  Improving.   IUP: S/P C section delivery at 34  weeks.    Hypokalemia and hypomagnesemia:  Replaced.    Enterococcus faecalis UTI:  Completed the course of treatment.    Acute thrombocytopenia:  Resolved.    DVT prophylaxis: SCDs Code Status: full code.  Family Communication: none at bedside.  Disposition:   Status is: Inpatient  Remains inpatient appropriate because:IV treatments appropriate due to intensity of illness or inability to take PO        Antimicrobials: none.    Subjective:  No new complaints today.  She is in good spirits.   Objective: Vitals:   03/01/21 0728 03/01/21 1245 03/01/21 1301 03/01/21 1541  BP: (!) 144/66  (!) 150/64 140/60  Pulse: 89  80 90  Resp: 18   18  Temp: 97.9 F (36.6 C) 98.1 F (36.7 C)  97.9 F (36.6 C)  TempSrc: Oral Oral  Oral  SpO2: 98% 100%  98%  Weight:      Height:        Intake/Output Summary (Last 24 hours) at 03/01/2021 1651 Last data filed at 03/01/2021 1500 Gross per 24 hour  Intake 1205 ml  Output 3100 ml  Net -1895 ml    Filed Weights   01/26/21 0530 02/08/21 1157 02/26/21 1448  Weight: 80.6 kg 76.1 kg 69.5 kg    Examination: General exam: young female not in distress.  Respiratory system: air entry fair, no wheezing heard.  Cardiovascular system: S1 & S2 heard, RRR no JVD. Improving pedal edema.  Gastrointestinal system: Abdomen is soft, mildly tender in the incision area.  Central nervous system: alert and oriented.  Extremities: leg edema improving.  Skin: c section incision area , indurated, and erythematous. Improving pain.  Psychiatry:  Mood is appropriate.      Data Reviewed: I have personally reviewed following labs and imaging studies  CBC: Recent Labs  Lab 02/25/21 0421 02/26/21 1215 02/27/21 1915 02/28/21 0437 03/01/21 0443  WBC 14.2* 12.9* 13.4* 12.1* 10.0  NEUTROABS  --  11.5*  --   --   --   HGB 7.5* 7.4* 8.0* 7.9* 8.0*  HCT 22.4* 23.0* 25.1* 24.2* 24.7*  MCV 88.9 89.5 90.9 90.3 91.5  PLT 235 260 307 308 314      Basic Metabolic Panel: Recent Labs  Lab 02/23/21 0447 02/23/21 1600 02/24/21 0327 02/25/21 0421 02/26/21 1215 02/27/21 0412 02/28/21 0437 03/01/21 0443  NA 132*  --  134* 135 138 139 139 136  K 5.4*   < > 5.4* 4.9 4.3 4.4 4.6 4.3  CL 101  --  102 99 102 101 99 97*  CO2 24  --  24 27 29  32 32 34*  GLUCOSE 76  --  199* 238* 255* 326* 214* 329*  BUN 54*  --  52* 57* 57* 61* 60* 60*  CREATININE 1.66*  --  1.47* 1.46* 1.44* 1.39* 1.35* 1.26*  CALCIUM 7.8*  --  8.1* 8.6* 8.5* 8.3* 8.5* 8.2*  MG 2.2  --  2.5* 2.3  --   --   --   --   PHOS 4.9*  --  4.4 4.1  --   --   --   --    < > =  values in this interval not displayed.     GFR: Estimated Creatinine Clearance: 58.8 mL/min (A) (by C-G formula based on SCr of 1.26 mg/dL (H)).  Liver Function Tests: Recent Labs  Lab 02/23/21 0447 02/24/21 0327 02/25/21 0421 02/28/21 0437  AST 19  --   --  23  ALT 23  --   --  23  ALKPHOS 53  --   --  102  BILITOT 0.6  --   --  1.0  PROT 4.4*  --   --  5.2*  ALBUMIN 1.7* 1.9* 2.0* 2.1*     CBG: Recent Labs  Lab 02/25/21 0119 02/28/21 0004 02/28/21 0439 02/28/21 0812 03/01/21 0442  GLUCAP 185* 226* 210* 174* 319*      No results found for this or any previous visit (from the past 240 hour(s)).        Radiology Studies: No results found.      Scheduled Meds:  sodium chloride   Intravenous Once   alteplase  2 mg Intracatheter Once   amLODipine  5 mg Oral Daily   Chlorhexidine Gluconate Cloth  6 each Topical Daily   cholecalciferol  1,000 Units Oral Daily   famotidine  20 mg Oral BID   furosemide  60 mg Intravenous Q12H   hydrocortisone  10 mg Oral Q24H   And   hydrocortisone  5 mg Oral Q24H   insulin aspart  0-15 Units Subcutaneous TID WC   insulin aspart  0-5 Units Subcutaneous QHS   insulin aspart  6 Units Subcutaneous TID WC   insulin glargine-yfgn  18 Units Subcutaneous Daily   measles, mumps & rubella vaccine  0.5 mL Subcutaneous Once    pantoprazole  40 mg Oral Daily   prenatal multivitamin  1 tablet Oral Q1200   simethicone  80 mg Oral TID PC   sodium chloride flush  10-40 mL Intracatheter Q12H   Tdap  0.5 mL Intramuscular Once   Continuous Infusions:   LOS: 75 days        Kathlen Mody, MD [facility]   To contact the attending provider between 7A-7P or the covering provider during after hours 7P-7A, please log into the web site [email or web address] and access using universal [place] password for that web site. If you do not have the password, please call the hospital operator.  03/01/2021, 4:51 PM

## 2021-03-01 NOTE — Lactation Note (Addendum)
This note was copied from a baby's chart. Lactation Consultation Note  Patient Name: Ashley Camacho [id number] Date: 03/01/2021 Reason for consult: Follow-up assessment;Maternal endocrine disorder;NICU baby;Late-preterm 34-36.6wks Age:30 days  Visited with mom of 72 62/82 weeks old (adjusted) NICU female, mom is still in-patient at [facility] and continues to pump but infrequently due to abdominal pain.  She came to the NICU today to visit her baby and brought 10 ml of EBM, she voiced she's trying to put reminders on her phone but that pain and exhaustion and keeping her from settling into a routine.   Mom understands that her supply will continue to dwindle if she's not pumping consistently, but she's still willing to try and to provide any amount of EBM she can get, praised her for her efforts. No s/s of engorgement at this point; mom reports pain is just on the abdominal region but not at the breast.  Plan of care:   Encouraged mom to try to pump as much as she can, ideally 8 times/24 hours but she'll go at her own pace as she recovers She'll contact the [facility] office upon discharge to get a DEBP, [facility]IC has already contacted her and knows she's still in-patient at the hospital   No other support person at this time. All questions and concerns answered, mom to call NICU LC PRN.  Maternal Data   Mom's supply is BNL and slowly dwindling due to infrequent pumping  Feeding Mother's Current Feeding Choice: Breast Milk and Formula  Lactation Tools Discussed/Used Tools: Pump Breast pump type: Double-Electric Breast Pump Pump Education: Setup, frequency, and cleaning;Milk Storage Reason for Pumping: LPI in NICU Pumping frequency: 1 pumping session/24 hours Pumped volume: 10 mL  Interventions Interventions: DEBP;Education  Discharge Pump: DEBP  Consult Status Consult Status: Follow-up Date: 03/01/21 Follow-up type: In-patient   Jerry Haugen Venetia Constable 03/01/2021, 12:34 PM

## 2021-03-02 LAB — GLUCOSE, CAPILLARY: Glucose-Capillary: 333 mg/dL — ABNORMAL HIGH (ref 70–99)

## 2021-03-02 LAB — CBC
HCT: 24.7 % — ABNORMAL LOW (ref 36.0–46.0)
Hemoglobin: 7.9 g/dL — ABNORMAL LOW (ref 12.0–15.0)
MCH: 29.5 pg (ref 26.0–34.0)
MCHC: 32 g/dL (ref 30.0–36.0)
MCV: 92.2 fL (ref 80.0–100.0)
Platelets: 321 10*3/uL (ref 150–400)
RBC: 2.68 MIL/uL — ABNORMAL LOW (ref 3.87–5.11)
RDW: 15 % (ref 11.5–15.5)
WBC: 9.2 10*3/uL (ref 4.0–10.5)
nRBC: 0 % (ref 0.0–0.2)

## 2021-03-02 LAB — BASIC METABOLIC PANEL
Anion gap: 7 (ref 5–15)
BUN: 60 mg/dL — ABNORMAL HIGH (ref 6–20)
CO2: 35 mmol/L — ABNORMAL HIGH (ref 22–32)
Calcium: 8.6 mg/dL — ABNORMAL LOW (ref 8.9–10.3)
Chloride: 97 mmol/L — ABNORMAL LOW (ref 98–111)
Creatinine, Ser: 1.32 mg/dL — ABNORMAL HIGH (ref 0.44–1.00)
GFR, Estimated: 56 mL/min — ABNORMAL LOW (ref >60)
Glucose, Bld: 142 mg/dL — ABNORMAL HIGH (ref 70–99)
Potassium: 3.7 mmol/L (ref 3.5–5.1)
Sodium: 139 mmol/L (ref 135–145)

## 2021-03-02 MED ORDER — INSULIN ASPART 100 UNIT/ML IJ SOLN
3.0000 [IU] | Freq: Once | INTRAMUSCULAR | Status: AC
  Administered 2021-03-02: 3 [IU] via SUBCUTANEOUS

## 2021-03-02 MED ORDER — INSULIN ASPART 100 UNIT/ML IJ SOLN
8.0000 [IU] | Freq: Once | INTRAMUSCULAR | Status: AC
  Administered 2021-03-03: 8 [IU] via SUBCUTANEOUS

## 2021-03-02 MED ORDER — INSULIN ASPART 100 UNIT/ML IJ SOLN: 3.0000 [IU] | Freq: Three times a day (TID) | INTRAMUSCULAR | Status: DC

## 2021-03-02 MED ORDER — INSULIN ASPART 100 UNIT/ML IJ SOLN
0.0000 [IU] | Freq: Three times a day (TID) | INTRAMUSCULAR | Status: DC
  Administered 2021-03-02: 4 [IU] via SUBCUTANEOUS

## 2021-03-02 MED ORDER — INSULIN GLARGINE-YFGN 100 UNIT/ML ~~LOC~~ SOLN
15.0000 [IU] | Freq: Every day | SUBCUTANEOUS | Status: DC
  Administered 2021-03-03: 15 [IU] via SUBCUTANEOUS
  Filled 2021-03-02: qty 0.15

## 2021-03-02 NOTE — Progress Notes (Signed)
Hypoglycemic Event  CBG: 69  Treatment: 4 oz juice/soda  Symptoms: None  Follow-up CBG: Time:1308 CBG Result:82  Possible Reasons for Event: Unknown   Ashley Camacho

## 2021-03-02 NOTE — Progress Notes (Signed)
Patient ID: Ashley Camacho, female   DOB: [birth year], 30 y.o.   MRN: [id number] Daily Obstetrics Consult Note  Admission Date: 12/16/2020 Current Date: 03/02/2021 9:41 AM  Ashley Camacho Ashley Camacho is a 30 y.o. [id number] POD#12 s/p repeat low transverse c-section via pfannenstiel incision and bilateral salpingectomy@ [redacted]w[redacted]d, admitted for multiple medical issues.  Post op course c/b suprapubic hematoma followed expectantly.   Overnight/24hr events:  None  Subjective:  Feels well. She is voiding spontaneously, ambulatory, passing gas and BM. Pain well controlled with oxycodone  Objective:     Current Vital Signs 24h Vital Sign Ranges  T 98 F (36.7 C) Temp  Avg: 98.1 F (36.7 C)  Min: 97.9 F (36.6 C)  Max: 98.2 F (36.8 C)  BP 140/61 BP  Min: 136/57  Max: 150/64  HR 90 Pulse  Avg: 88.8  Min: 80  Max: 98  RR 18 Resp  Avg: 18  Min: 18  Max: 18  SaO2 98 % Room Air SpO2  Avg: 98.2 %  Min: 97 %  Max: 100 %       24 Hour I/O Current Shift I/O  Time Ins Outs 10/09 0701 - 10/10 0700 In: 660 [P.O.:660] Out: 3500 [Urine:3500] No intake/output data recorded.   Patient Vitals for the past 24 hrs:  BP Temp Temp src Pulse Resp SpO2  03/02/21 0753 140/61 98 F (36.7 C) Oral 90 18 98 %  03/02/21 0423 (!) 136/57 98.2 F (36.8 C) Oral 85 18 98 %  03/01/21 2315 (!) 137/56 98.1 F (36.7 C) Oral 90 18 97 %  03/01/21 1950 -- -- -- -- -- 98 %  03/01/21 1948 139/61 98 F (36.7 C) Oral 98 18 --  03/01/21 1541 140/60 97.9 F (36.6 C) Oral 90 18 98 %  03/01/21 1301 (!) 150/64 -- -- 80 -- --  03/01/21 1245 -- 98.1 F (36.7 C) Oral -- -- 100 %    Physical exam: General: Well nourished, well developed female in no acute distress.  Abdomen: (stable). Incision dressing c/d/I with no new drainage on it from yesterday  Cardiovascular: S1, S2 normal, no murmur, rub or gallop, regular rate and rhythm Respiratory: CTAB Extremities: no clubbing, cyanosis or edema Skin: Warm and dry   Labs:  Recent Labs   Lab 02/26/21 1215 02/27/21 1915 02/28/21 0437 03/01/21 0443 03/02/21 0320  WBC 12.9* 13.4* 12.1* 10.0 9.2  HGB 7.4* 8.0* 7.9* 8.0* 7.9*  HCT 23.0* 25.1* 24.2* 24.7* 24.7*  PLT 260 307 308 314 321  MCV 89.5 90.9 90.3 91.5 92.2  MCH 28.8 29.0 29.5 29.6 29.5  MCHC 32.2 31.9 32.6 32.4 32.0  RDW 15.8* 15.1 15.1 15.1 15.0  LYMPHSABS 0.8  --   --   --   --   MONOABS 0.5  --   --   --   --   EOSABS 0.0  --   --   --   --   BASOSABS 0.0  --   --   --   --     Recent Labs  Lab 02/24/21 0327 02/25/21 0421 02/26/21 1215 02/27/21 0412 02/28/21 0437 03/01/21 0443  NA 134* 135 138 139 139 136  K 5.4* 4.9 4.3 4.4 4.6 4.3  CL 102 99 102 101 99 97*  CO2 24 27 29  32 32 34*  GLUCOSE 199* 238* 255* 326* 214* 329*  BUN 52* 57* 57* 61* 60* 60*  CREATININE 1.47* 1.46* 1.44* 1.39* 1.35* 1.26*  CALCIUM 8.1* 8.6* 8.5*  8.3* 8.5* 8.2*  AST  --   --   --   --  23  --   ALT  --   --   --   --  23  --   ALKPHOS  --   --   --   --  102  --   BILITOT  --   --   --   --  1.0  --   ALBUMIN 1.9* 2.0*  --   --  2.1*  --   MG 2.5* 2.3  --   --   --   --   BNP  --  1,024.1*  --   --  715.6*  --    Radiology  No new imaging  Assessment & Plan:  - Patient hemodynamically stable - Will continue to check on her incision daily while she is inpatient. Patient scheduled for outpatient follow up in 2 weeks - Her pain is adequately controlled and her hemoglobin is stable.  - Patient is surgically cleared for discharge. - Discharge pending medical team. - Appreciate care of Hospitalist team with her other multiple issues.   Catalina Antigua Attending [facility] for [employer] [facility]) GYN Consult Phone: [phone] (M-F, [phone]) & [phone]  (Off hours, weekends, holidays)

## 2021-03-02 NOTE — Progress Notes (Signed)
Progress Note  Patient Name: Ashley Camacho Date of Encounter: 03/02/2021  Primary Cardiologist:   Thurmon Fair, MD   Subjective   Denies SOB.  Still with lots of lower extremity edema  Inpatient Medications    Scheduled Meds:  sodium chloride   Intravenous Once   alteplase  2 mg Intracatheter Once   amLODipine  5 mg Oral Daily   Chlorhexidine Gluconate Cloth  6 each Topical Daily   cholecalciferol  1,000 Units Oral Daily   famotidine  20 mg Oral BID   furosemide  60 mg Intravenous Q12H   hydrocortisone  10 mg Oral Q24H   And   hydrocortisone  5 mg Oral Q24H   insulin aspart  0-15 Units Subcutaneous TID WC   insulin aspart  0-5 Units Subcutaneous QHS   insulin glargine-yfgn  18 Units Subcutaneous Daily   measles, mumps & rubella vaccine  0.5 mL Subcutaneous Once   pantoprazole  40 mg Oral Daily   prenatal multivitamin  1 tablet Oral Q1200   simethicone  80 mg Oral TID PC   sodium chloride flush  10-40 mL Intracatheter Q12H   Tdap  0.5 mL Intramuscular Once   Continuous Infusions:  PRN Meds: acetaminophen, bisacodyl, coconut oil, witch hazel-glycerin **AND** dibucaine, diphenhydrAMINE, hydrALAZINE, HYDROmorphone (DILAUDID) injection, menthol-cetylpyridinium, metoCLOPramide, oxyCODONE, senna-docusate, simethicone, sodium chloride, zolpidem   Vital Signs    Vitals:   03/01/21 1950 03/01/21 2315 03/02/21 0423 03/02/21 0753  BP:  (!) 137/56 (!) 136/57 140/61  Pulse:  90 85 90  Resp:  18 18 18   Temp:  98.1 F (36.7 C) 98.2 F (36.8 C) 98 F (36.7 C)  TempSrc:  Oral Oral Oral  SpO2: 98% 97% 98% 98%  Weight:      Height:        Intake/Output Summary (Last 24 hours) at 03/02/2021 0815 Last data filed at 03/02/2021 0753 Gross per 24 hour  Intake 660 ml  Output 3500 ml  Net -2840 ml   Filed Weights   01/26/21 0530 02/08/21 1157 02/26/21 1448  Weight: 80.6 kg 76.1 kg 69.5 kg    Telemetry    NA - Personally Reviewed  ECG    NA - Personally  Reviewed  Physical Exam   GEN: No acute distress.   Neck: No  JVD Cardiac: RRR, no murmurs, rubs, or gallops.  Respiratory: Clear  to auscultation bilaterally. GI: Soft, nontender, non-distended  MS:     Severe bilateral leg edema; No deformity. Neuro:  Nonfocal  Psych: Normal affect   Labs    Chemistry Recent Labs  Lab 02/24/21 0327 02/25/21 0421 02/26/21 1215 02/27/21 0412 02/28/21 0437 03/01/21 0443  NA 134* 135   < > 139 139 136  K 5.4* 4.9   < > 4.4 4.6 4.3  CL 102 99   < > 101 99 97*  CO2 24 27   < > 32 32 34*  GLUCOSE 199* 238*   < > 326* 214* 329*  BUN 52* 57*   < > 61* 60* 60*  CREATININE 1.47* 1.46*   < > 1.39* 1.35* 1.26*  CALCIUM 8.1* 8.6*   < > 8.3* 8.5* 8.2*  PROT  --   --   --   --  5.2*  --   ALBUMIN 1.9* 2.0*  --   --  2.1*  --   AST  --   --   --   --  23  --   ALT  --   --   --   --  23  --   ALKPHOS  --   --   --   --  102  --   BILITOT  --   --   --   --  1.0  --   GFRNONAA 49* 50*   < > 53* 55* 59*  ANIONGAP 8 9   < > 6 8 5    < > = values in this interval not displayed.     Hematology Recent Labs  Lab 02/28/21 0437 03/01/21 0443 03/02/21 0320  WBC 12.1* 10.0 9.2  RBC 2.68* 2.70* 2.68*  HGB 7.9* 8.0* 7.9*  HCT 24.2* 24.7* 24.7*  MCV 90.3 91.5 92.2  MCH 29.5 29.6 29.5  MCHC 32.6 32.4 32.0  RDW 15.1 15.1 15.0  PLT 308 314 321    Cardiac EnzymesNo results for input(s): TROPONINI in the last 168 hours. No results for input(s): TROPIPOC in the last 168 hours.   BNP Recent Labs  Lab 02/25/21 0421 02/28/21 0437  BNP 1,024.1* 715.6*     DDimer No results for input(s): DDIMER in the last 168 hours.   Radiology    No results found.  Cardiac Studies   ECHO:   1. Left ventricular ejection fraction, by estimation, is 50 to 55%. The  left ventricle has low normal function. The left ventricle demonstrates  global hypokinesis. Left ventricular diastolic parameters were normal.   2. Right ventricular systolic function is normal.  The right ventricular  size is normal. There is normal pulmonary artery systolic pressure. The  estimated right ventricular systolic pressure is 35.7 mmHg.   3. A small pericardial effusion is present. The pericardial effusion is  posterior to the left ventricle. There is no evidence of cardiac  tamponade.   4. The mitral valve is normal in structure. Moderate mitral valve  regurgitation. No evidence of mitral stenosis.   5. The aortic valve is normal in structure. Aortic valve regurgitation is  trivial. No aortic stenosis is present.   6. Pulmonic valve regurgitation is moderate.   7. The inferior vena cava is normal in size with greater than 50%  respiratory variability, suggesting right atrial pressure of 3 mmHg.   Patient Profile     30 y.o. female with history of type 1 diabetes mellitus heart failure with mildly reduced left ventricular systolic function of uncertain etiology (possibly nutritional due to severe emesis during pregnancy), adrenal insufficiency, severe hypoalbuminemia, improving following delivery of a 34-week daughter.  Assessment & Plan    ACUTE DIASTOLIC HF:  Intake and out put is net negative 16 liters.     .  Not able to use ARB or spironolactone with abnormal kidney function.   On 60 mg IV BID Lasix.    Renal function is stable.   Continue current IV diuresis   HTN:  Norvasc started.   BP is upper limites of normal.    ANEMIA:  Hgb is unchanged over several days.  .     For questions or updates, please contact [facility] Please consult [email or web address] for contact info under Cardiology/STEMI.   Signed, 37, MD  03/02/2021, 8:15 AM

## 2021-03-02 NOTE — Progress Notes (Signed)
PROGRESS NOTE    Ashley Camacho  KDT:[id number] DOB: 1990/08/22 DOA: 12/16/2020 PCP: Hildred Laser, MD    No chief complaint on file.   Brief Narrative:  30 y.o. G2P0101 with a history of T1DM, chronic HFrEF and recent hospitalization at [facility] on 6/30 for N/V and rhinovirus pneumonia requiring transfer to [facility].  She was discharged home from [facility] on 7/20 only to return on 7/22 with starvation ketoacidosis in the setting of nausea and vomiting likely due to uncontrolled DM-1 and gastroparesis, and brachial vein DVT.She was a started on TPN through PICC line and transferred to [facility] for cortrak and TF initiation.    Hospital course noteworthy of systolic CHF exacerbation (on IV diuretics), AI (p.o. Cortef), ABLA, hematemesis in the setting of grade C esophagitis on EGD, hematuria (seems to have resolved), moderate right hydronephrosis (improved), Enterococcus faecalis UTI (completed treatment), acute urinary retention (resolved)...   Patient underwent cesarean section on 9/27.  Had postop blood loss with large bladder flap hematoma measuring requiring blood transfusions.  CTA did not show active extravasation.  H&H seems to be stable now. Pt seen, diuresing well with increased dose of IV lasix . Echocardiogram reviewed with the patient.     Assessment & Plan:   Active Problems:   Delivery with history of C-section   Acute kidney injury (HCC)   Hyperemesis   DKA, type 1, not at goal [facility])   Acute deep vein thrombosis (DVT) of brachial vein of right upper extremity (HCC)   Vitreous hemorrhage of right eye (HCC)   Gastroparesis due to DM (HCC)   Diabetic retinopathy (HCC)   Acute on chronic combined systolic and diastolic CHF (congestive heart failure) (HCC)   Type 1 diabetes mellitus affecting pregnancy in second trimester, antepartum   HFrEF (heart failure with reduced ejection fraction) (HCC)   Anemia during pregnancy   Pre-existing type 1 diabetes mellitus during pregnancy in third  trimester   History of preterm delivery, currently pregnant   Leg edema   Acute esophagitis   S/P cesarean section   Postoperative hematoma involving genitourinary system following genitourinary procedure  Acute systolic heart failure with improved EF/volume overload  Pt on 60 mg BID of IV lasix, and diuresing well.   Strict intake and output and fluid restriction to .  Creatinine around 1.3,  she has diuresed about 16.9 lit since admission.  Appreciate cardiology input.   Will try to cut down the dose of lasix if creatinine continues to worsen.    Postsurgical anemia of blood loss/hematoma Hemoglobin around 8.  Transfuse to keep hemoglobin greater than 7.    Acute urinary retention/bilateral hydronephrosis Resolved.    Uncontrolled type 1 diabetes with hyperglycemia and hypoglycemia Pt plans to go back on the insulin pump once she receives the pump from the mail .  Currently she is on smglee 18 units, and her CBG's dropped to 50's. Will decrease the Semglee 15 units .  CBG (last 3)  Recent Labs    03/01/21 0442 03/01/21 1743 03/01/21 1800  GLUCAP 319* 39* 57*    Discussed in detail with Dr Amalia Hailey her endocrinologist, recommended 1 unit of Novolog [facility] and very sensitive SSI.     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 Continue the same.  She will need close follow-up with endocrinology postdischarge. Called her Endocrinology office and scheduled  an appt for October 18 th . Pt can go the appt anytime and they will try to squeeze her in to see the Dr Amalia Hailey.    Hyperkalemia Resolved  Hyponatremia probably from hypervolemia/fluid overload Resolved.     Hypertension  Bp parameters have improved. .  Cardiology added 2.5 of amlodipine.    Right brachial DVT Recannulation of the blood  vessel.  Pt ambulating.    GERD .  Continue with PPI.   AKI/azotemia Patient's baseline creatinine less than 1 currently stable around 1.3  with IV diuresis.   Continue to monitor.  Leukocytosis  Improving.   IUP: S/P C section delivery at 34 weeks.    Hypokalemia and hypomagnesemia:  Replaced.    Enterococcus faecalis UTI:  Completed the course of treatment.    Acute thrombocytopenia:  Resolved.    DVT prophylaxis: SCDs Code Status: full code.  Family Communication: none at bedside.  Disposition:   Status is: Inpatient  Remains inpatient appropriate because:IV treatments appropriate due to intensity of illness or inability to take PO        Antimicrobials: none.    Subjective: No new complaints.  Objective: Vitals:   03/02/21 0423 03/02/21 0753 03/02/21 1250 03/02/21 1556  BP: (!) 136/57 140/61 (!) 155/68 (!) 138/58  Pulse: 85 90 95 (!) 105  Resp: 18 18 18 18   Temp: 98.2 F (36.8 C) 98 F (36.7 C) 97.7 F (36.5 C) 98.7 F (37.1 C)  TempSrc: Oral Oral Oral Oral  SpO2: 98% 98% 100% 98%  Weight:      Height:        Intake/Output Summary (Last 24 hours) at 03/02/2021 1700 Last data filed at 03/02/2021 1556 Gross per 24 hour  Intake 120 ml  Output 2950 ml  Net -2830 ml    Filed Weights   01/26/21 0530 02/08/21 1157 02/26/21 1448  Weight: 80.6 kg 76.1 kg 69.5 kg    Examination:  General exam: Appears calm and comfortable  Respiratory system: Clear to auscultation. Respiratory effort normal. Cardiovascular system: S1 & S2 heard, RRR.  Improving pedal edema.  Gastrointestinal system: Abdomen is nondistended, soft and nontender.  Normal bowel sounds heard. Central nervous system: Alert and oriented. No focal neurological deficits. Extremities: Symmetric 5 x 5 power. Skin:  c section incision area , indurated, and erythematous, slightly tender.  Psychiatry: Mood & affect appropriate.       Data Reviewed: I have personally reviewed  following labs and imaging studies  CBC: Recent Labs  Lab 02/26/21 1215 02/27/21 1915 02/28/21 0437 03/01/21 0443 03/02/21 0320  WBC 12.9* 13.4* 12.1* 10.0 9.2  NEUTROABS 11.5*  --   --   --   --   HGB 7.4* 8.0* 7.9* 8.0* 7.9*  HCT 23.0* 25.1* 24.2* 24.7* 24.7*  MCV 89.5 90.9 90.3 91.5 92.2  PLT 260 307 308 314 321     Basic Metabolic Panel: Recent Labs  Lab 02/24/21 0327 02/25/21 0421 02/26/21 1215 02/27/21 0412 02/28/21 0437 03/01/21 0443 03/02/21 1032  NA 134* 135 138 139 139 136 139  K 5.4* 4.9 4.3 4.4 4.6 4.3 3.7  CL 102 99 102 101 99 97* 97*  CO2 24 27 29  32 32 34* 35*  GLUCOSE 199* 238* 255* 326* 214* 329* 142*  BUN 52* 57* 57* 61* 60* 60* 60*  CREATININE 1.47* 1.46* 1.44* 1.39* 1.35* 1.26* 1.32*  CALCIUM 8.1* 8.6* 8.5* 8.3* 8.5* 8.2* 8.6*  MG 2.5* 2.3  --   --   --   --   --  PHOS 4.4 4.1  --   --   --   --   --      GFR: Estimated Creatinine Clearance: 56.1 mL/min (A) (by C-G formula based on SCr of 1.32 mg/dL (H)).  Liver Function Tests: Recent Labs  Lab 02/24/21 0327 02/25/21 0421 02/28/21 0437  AST  --   --  23  ALT  --   --  23  ALKPHOS  --   --  102  BILITOT  --   --  1.0  PROT  --   --  5.2*  ALBUMIN 1.9* 2.0* 2.1*     CBG: Recent Labs  Lab 02/28/21 0439 02/28/21 0812 03/01/21 0442 03/01/21 1743 03/01/21 1800  GLUCAP 210* 174* 319* 39* 57*      No results found for this or any previous visit (from the past 240 hour(s)).        Radiology Studies: No results found.      Scheduled Meds:  sodium chloride   Intravenous Once   alteplase  2 mg Intracatheter Once   amLODipine  5 mg Oral Daily   Chlorhexidine Gluconate Cloth  6 each Topical Daily   cholecalciferol  1,000 Units Oral Daily   famotidine  20 mg Oral BID   furosemide  60 mg Intravenous Q12H   hydrocortisone  10 mg Oral Q24H   And   hydrocortisone  5 mg Oral Q24H   insulin aspart  0-6 Units Subcutaneous TID WC   [START ON 03/03/2021] insulin  glargine-yfgn  15 Units Subcutaneous Daily   measles, mumps & rubella vaccine  0.5 mL Subcutaneous Once   pantoprazole  40 mg Oral Daily   prenatal multivitamin  1 tablet Oral Q1200   simethicone  80 mg Oral TID PC   sodium chloride flush  10-40 mL Intracatheter Q12H   Tdap  0.5 mL Intramuscular Once   Continuous Infusions:   LOS: 76 days        Kathlen Mody, MD [facility]   To contact the attending provider between 7A-7P or the covering provider during after hours 7P-7A, please log into the web site [email or web address] and access using universal [place] password for that web site. If you do not have the password, please call the hospital operator.  03/02/2021, 5:00 PM

## 2021-03-02 NOTE — Progress Notes (Signed)
Inpatient Diabetes Program Recommendations  AACE/ADA: New Consensus Statement on Inpatient Glycemic Control   Target Ranges:  Prepandial:   less than 140 mg/dL      Peak postprandial:   less than 180 mg/dL (1-2 hours)      Critically ill patients:  140 - 180 mg/dL  Results for Ashley Camacho, Ashley Camacho (MRN [id number]) as of 03/02/2021 09:43  Ref. Range 03/02/2021 00:28 03/02/2021 1:35  03/02/2021 03:00 03/02/2021 9:44  Glucose-Capillary Latest Ref Range: 70 - 99 mg/dL 409   Novolog 3 units 811   Novolog 2 units  Semglee 18 units   Results for Ashley Camacho, Ashley Camacho (MRN [id number]) as of 03/02/2021 09:43  Ref. Range 03/01/2021 04:42 03/01/2021 8:26 03/01/2021 12:48 03/01/2021 15:00 03/01/2021 17:43 03/01/2021 18:00 03/01/2021 21:58  Glucose-Capillary Latest Ref Range: 70 - 99 mg/dL 213 (H)  Novolog 5 units @5 :09   Novolog 7 units     Semglee 18 units 102  Novolog 6 units 39 (LL) 57 (L) 211  Novolog 2 units   Review of Glycemic Control  Diabetes history: DM 1 Outpatient Diabetes medications: Omnipod with Novolog insulin  Most recent insulin pump rates from [facility]: 0.05 units/hr= 1.2 units in 24 hours, 1 units drops blood sugar 100 mg/dL, 1 units per 30 grams of CHO   In DM coordinator note from 11/20/2020: "Current pump settings are: 12-6am  0.15 units/hour 6am-12am 0.25 units/hour Total 5.4 units/24 hours  CF 80 (1 unit drops glucose 80 mg/dl) I:C 11/22/2020 (1 unit covers 20 grams of carbs) Target BG 100 mg/dl"   Current orders for Inpatient glycemic control: Semglee 18 units daily, Novolog 0-15 units TID with meals, Novolog 0-5 units QHS; Cortef 10 mg Q24H   Inpatient Diabetes Program Recommendations:    Insulin: Please decrease Novolog correction to 0-9  units TID with meals and order Novolog 4 units TID with meals for meal coverage if patient eats at least 50% of meals.  Thanks, 0:86, RN, MSN, CDE Diabetes Coordinator Inpatient Diabetes Program [phone] (Team Pager  from 8am to 5pm)

## 2021-03-03 LAB — BASIC METABOLIC PANEL
Anion gap: 5 (ref 5–15)
BUN: 57 mg/dL — ABNORMAL HIGH (ref 6–20)
CO2: 36 mmol/L — ABNORMAL HIGH (ref 22–32)
Calcium: 8.4 mg/dL — ABNORMAL LOW (ref 8.9–10.3)
Chloride: 100 mmol/L (ref 98–111)
Creatinine, Ser: 1.25 mg/dL — ABNORMAL HIGH (ref 0.44–1.00)
GFR, Estimated: 60 mL/min — ABNORMAL LOW (ref >60)
Glucose, Bld: 96 mg/dL (ref 70–99)
Potassium: 4.1 mmol/L (ref 3.5–5.1)
Sodium: 141 mmol/L (ref 135–145)

## 2021-03-03 LAB — TYPE AND SCREEN
ABO/RH(D): A POS
Antibody Screen: NEGATIVE

## 2021-03-03 MED ORDER — INSULIN ASPART 100 UNIT/ML IJ SOLN
2.0000 [IU] | Freq: Three times a day (TID) | INTRAMUSCULAR | Status: DC
  Administered 2021-03-03 – 2021-03-04 (×5): 2 [IU] via SUBCUTANEOUS

## 2021-03-03 MED ORDER — INSULIN GLARGINE-YFGN 100 UNIT/ML ~~LOC~~ SOLN
14.0000 [IU] | Freq: Every day | SUBCUTANEOUS | Status: DC
  Administered 2021-03-04: 14 [IU] via SUBCUTANEOUS
  Filled 2021-03-03: qty 0.14

## 2021-03-03 MED ORDER — INSULIN ASPART 100 UNIT/ML IJ SOLN
0.0000 [IU] | INTRAMUSCULAR | Status: DC
  Administered 2021-03-03: 2 [IU] via SUBCUTANEOUS
  Administered 2021-03-03 (×2): 1 [IU] via SUBCUTANEOUS
  Administered 2021-03-04: 2 [IU] via SUBCUTANEOUS
  Administered 2021-03-04: 1 [IU] via SUBCUTANEOUS
  Administered 2021-03-04: 2 [IU] via SUBCUTANEOUS
  Administered 2021-03-04 (×2): 1 [IU] via SUBCUTANEOUS

## 2021-03-03 NOTE — Progress Notes (Signed)
Progress Note  Patient Name: Ashley Camacho Date of Encounter: 03/03/2021  Primary Cardiologist:   Thurmon Fair, MD   Subjective   Breathing OK.  Still with incisional pain.   Inpatient Medications    Scheduled Meds:  sodium chloride   Intravenous Once   alteplase  2 mg Intracatheter Once   amLODipine  5 mg Oral Daily   Chlorhexidine Gluconate Cloth  6 each Topical Daily   cholecalciferol  1,000 Units Oral Daily   famotidine  20 mg Oral BID   furosemide  60 mg Intravenous Q12H   hydrocortisone  10 mg Oral Q24H   And   hydrocortisone  5 mg Oral Q24H   insulin aspart  0-6 Units Subcutaneous TID WC   insulin glargine-yfgn  15 Units Subcutaneous Daily   measles, mumps & rubella vaccine  0.5 mL Subcutaneous Once   pantoprazole  40 mg Oral Daily   prenatal multivitamin  1 tablet Oral Q1200   simethicone  80 mg Oral TID PC   sodium chloride flush  10-40 mL Intracatheter Q12H   Tdap  0.5 mL Intramuscular Once   Continuous Infusions:  PRN Meds: acetaminophen, bisacodyl, coconut oil, witch hazel-glycerin **AND** dibucaine, diphenhydrAMINE, hydrALAZINE, HYDROmorphone (DILAUDID) injection, menthol-cetylpyridinium, metoCLOPramide, oxyCODONE, senna-docusate, simethicone, sodium chloride, zolpidem   Vital Signs    Vitals:   03/02/21 1250 03/02/21 1556 03/02/21 2143 03/03/21 0557  BP: (!) 155/68 (!) 138/58 (!) 146/63 (!) 145/56  Pulse: 95 (!) 105 93 87  Resp: 18 18    Temp: 97.7 F (36.5 C) 98.7 F (37.1 C) 98.7 F (37.1 C) 98 F (36.7 C)  TempSrc: Oral Oral Oral Oral  SpO2: 100% 98% 100% 98%  Weight:      Height:        Intake/Output Summary (Last 24 hours) at 03/03/2021 0743 Last data filed at 03/03/2021 0600 Gross per 24 hour  Intake 706 ml  Output 2300 ml  Net -1594 ml   Filed Weights   01/26/21 0530 02/08/21 1157 02/26/21 1448  Weight: 80.6 kg 76.1 kg 69.5 kg    Telemetry    NA - Personally Reviewed  ECG    NA - Personally Reviewed  Physical  Exam   GEN: No  acute distress.   Neck: No  JVD Cardiac: RRR, no murmurs, rubs, or gallops.  Respiratory: Clear  to auscultation bilaterally. GI: Soft, nontender, non-distended, normal bowel sounds  MS:  Moderate bilateral leg edema; No deformity. Neuro:   Nonfocal  Psych: Oriented and appropriate    Labs    Chemistry Recent Labs  Lab 02/25/21 0421 02/26/21 1215 02/28/21 0437 03/01/21 0443 03/02/21 1032 03/03/21 0450  NA 135   < > 139 136 139 141  K 4.9   < > 4.6 4.3 3.7 4.1  CL 99   < > 99 97* 97* 100  CO2 27   < > 32 34* 35* 36*  GLUCOSE 238*   < > 214* 329* 142* 96  BUN 57*   < > 60* 60* 60* 57*  CREATININE 1.46*   < > 1.35* 1.26* 1.32* 1.25*  CALCIUM 8.6*   < > 8.5* 8.2* 8.6* 8.4*  PROT  --   --  5.2*  --   --   --   ALBUMIN 2.0*  --  2.1*  --   --   --   AST  --   --  23  --   --   --   ALT  --   --  23  --   --   --   ALKPHOS  --   --  102  --   --   --   BILITOT  --   --  1.0  --   --   --   GFRNONAA 50*   < > 55* 59* 56* 60*  ANIONGAP 9   < > 8 5 7 5    < > = values in this interval not displayed.     Hematology Recent Labs  Lab 02/28/21 0437 03/01/21 0443 03/02/21 0320  WBC 12.1* 10.0 9.2  RBC 2.68* 2.70* 2.68*  HGB 7.9* 8.0* 7.9*  HCT 24.2* 24.7* 24.7*  MCV 90.3 91.5 92.2  MCH 29.5 29.6 29.5  MCHC 32.6 32.4 32.0  RDW 15.1 15.1 15.0  PLT 308 314 321    Cardiac EnzymesNo results for input(s): TROPONINI in the last 168 hours. No results for input(s): TROPIPOC in the last 168 hours.   BNP Recent Labs  Lab 02/25/21 0421 02/28/21 0437  BNP 1,024.1* 715.6*     DDimer No results for input(s): DDIMER in the last 168 hours.   Radiology    No results found.  Cardiac Studies   ECHO:   1. Left ventricular ejection fraction, by estimation, is 50 to 55%. The  left ventricle has low normal function. The left ventricle demonstrates  global hypokinesis. Left ventricular diastolic parameters were normal.   2. Right ventricular systolic function  is normal. The right ventricular  size is normal. There is normal pulmonary artery systolic pressure. The  estimated right ventricular systolic pressure is 35.7 mmHg.   3. A small pericardial effusion is present. The pericardial effusion is  posterior to the left ventricle. There is no evidence of cardiac  tamponade.   4. The mitral valve is normal in structure. Moderate mitral valve  regurgitation. No evidence of mitral stenosis.   5. The aortic valve is normal in structure. Aortic valve regurgitation is  trivial. No aortic stenosis is present.   6. Pulmonic valve regurgitation is moderate.   7. The inferior vena cava is normal in size with greater than 50%  respiratory variability, suggesting right atrial pressure of 3 mmHg.   Patient Profile     30 y.o. female with history of type 1 diabetes mellitus heart failure with mildly reduced left ventricular systolic function of uncertain etiology (possibly nutritional due to severe emesis during pregnancy), adrenal insufficiency, severe hypoalbuminemia, improving following delivery of a 34-week daughter.  Assessment & Plan    ACUTE DIASTOLIC HF:  Intake and out put is net negative 17.6 liters.   Continue IV diuresis while she is in the hospital.   HTN:  Norvasc started.   BP mildly elevated.  Continue current dosing and she needs a BP cuff at home to follow up BP.s    ANEMIA:  Hgb low.  Follow per primary team.      For questions or updates, please contact [facility] Please consult [email or web address] for contact info under Cardiology/STEMI.   Signed, 37, MD  03/03/2021, 7:43 AM

## 2021-03-03 NOTE — Progress Notes (Signed)
PROGRESS NOTE    Ashley Camacho  MWN:[id number] DOB: June 24, 1990 DOA: 12/16/2020 PCP: Hildred Laser, MD    No chief complaint on file.   Brief Narrative:  30 y.o. G2P0101 with a history of T1DM, chronic HFrEF and recent hospitalization at [facility] on 6/30 for N/V and rhinovirus pneumonia requiring transfer to [facility].  She was discharged home from [facility] on 7/20 only to return on 7/22 with starvation ketoacidosis in the setting of nausea and vomiting likely due to uncontrolled DM-1 and gastroparesis, and brachial vein DVT.She was a started on TPN through PICC line and transferred to [facility] for cortrak and TF initiation.    Hospital course noteworthy of systolic CHF exacerbation (on IV diuretics), AI (p.o. Cortef), ABLA, hematemesis in the setting of grade C esophagitis on EGD, hematuria (seems to have resolved), moderate right hydronephrosis (improved), Enterococcus faecalis UTI (completed treatment), acute urinary retention (resolved)...   Patient underwent cesarean section on 9/27.  Had postop blood loss with large bladder flap hematoma measuring requiring blood transfusions.  CTA did not show active extravasation.  H&H seems to be stable now. Pt seen, diuresing well with increased dose of IV lasix . Echocardiogram reviewed with the patient.   Discussed with her endocrinologist on the phone and she should be able to go home with atleast 2 weeks of cortef 10 mg in the morning and 5 mg in the evening.  Ideally it would be best if she gets discharged on the insulin pump, her endocrinology office has mailed it but she hasn't received it yet. Since she doesn't have the insulin pump, recommend watching her on Semglee 14units daily with 1 to 2 units of Novolog TIDAC and Novolog 0 to 6 units every 4 hours for SSI for 24 hours to 48 hours and can be discharged home after we receive cardiology recommendations on the diuretic dose for discharge.  Of note she has an appointment with her endocrinologist Dr Amalia Hailey on  October 18th ( they will see her anytime) and she cannot make that appointment, she has another appt on 27 th of October at 1:30 pm.    Assessment & Plan:   Active Problems:   Delivery with history of C-section   Acute kidney injury (HCC)   Hyperemesis   DKA, type 1, not at goal [facility])   Acute deep vein thrombosis (DVT) of brachial vein of right upper extremity (HCC)   Vitreous hemorrhage of right eye (HCC)   Gastroparesis due to DM (HCC)   Diabetic retinopathy (HCC)   Acute on chronic combined systolic and diastolic CHF (congestive heart failure) (HCC)   Type 1 diabetes mellitus affecting pregnancy in second trimester, antepartum   HFrEF (heart failure with reduced ejection fraction) (HCC)   Anemia during pregnancy   Pre-existing type 1 diabetes mellitus during pregnancy in third trimester   History of preterm delivery, currently pregnant   Leg edema   Acute esophagitis   S/P cesarean section   Postoperative hematoma involving genitourinary system following genitourinary procedure  Acute systolic heart failure with improved EF/volume overload  Pt on 60 mg BID of IV lasix, and diuresing well.   Strict intake and output and fluid restriction to .  Creatinine around 1.3,  she has diuresed about 18.4 lit since admission.  Appreciate cardiology input.      Postsurgical anemia of blood loss/hematoma Hemoglobin around 8.  Transfuse to keep hemoglobin greater than 7.    Acute urinary retention/bilateral hydronephrosis Resolved.    Uncontrolled type 1 diabetes  with hyperglycemia and hypoglycemia Pt plans to go back on the insulin pump once she receives the pump from the mail .  Currently she is on Semglee 14 units, novolog 2 units tidac and novolog 0 to 6 units for SSI. Marland Kitchen      Adrenal insufficiency Low cortisol level with minimal to no response to ACTH probably suggests primary adrenal insufficiency Slowly weaning Cortef Currently on hydrocortisone 10 mg in the  morning and 5 at night. Continue the same.  She will need close follow-up with endocrinology postdischarge. Called her Endocrinology office and scheduled an appt for October 18 th . Pt can go the appt anytime and they will try to squeeze her in to see the Dr Amalia Hailey.    Hyperkalemia Resolved  Hyponatremia probably from hypervolemia/fluid overload Resolved.     Hypertension  Bp parameters have improved. .  Cardiology added 2.5 of amlodipine.    Right brachial DVT Recannulation of the blood vessel.  Pt ambulating.    GERD .  Continue with PPI.   AKI/azotemia Patient's baseline creatinine less than 1 currently stable around 1.2  with IV diuresis.   Continue to monitor.  Leukocytosis  Improving.   IUP: S/P C section delivery at 34 weeks.    Hypokalemia and hypomagnesemia:  Replaced.    Enterococcus faecalis UTI:  Completed the course of treatment.    Acute thrombocytopenia:  Resolved.    DVT prophylaxis: SCDs Code Status: full code.  Family Communication: none at bedside.  Disposition:   Status is: Inpatient  Remains inpatient appropriate because:IV treatments appropriate due to intensity of illness or inability to take PO        Antimicrobials: none.    Subjective: No new complaints.  Objective: Vitals:   03/02/21 2143 03/03/21 0557 03/03/21 1119 03/03/21 1639  BP: (!) 146/63 (!) 145/56 (!) 139/56 (!) 132/56  Pulse: 93 87 92 88  Resp:   17 18  Temp: 98.7 F (37.1 C) 98 F (36.7 C) 98 F (36.7 C) (!) 97.4 F (36.3 C)  TempSrc: Oral Oral Oral Oral  SpO2: 100% 98% 99% 100%  Weight:      Height:        Intake/Output Summary (Last 24 hours) at 03/03/2021 1726 Last data filed at 03/03/2021 1120 Gross per 24 hour  Intake 706 ml  Output 2300 ml  Net -1594 ml    Filed Weights   01/26/21 0530 02/08/21 1157 02/26/21 1448  Weight: 80.6 kg 76.1 kg 69.5 kg    Examination:  General exam: Appears calm and comfortable  Respiratory system:  Clear to auscultation. Respiratory effort normal. Cardiovascular system: S1 & S2 heard, RRR. No JVD, improving pedal edema.  Gastrointestinal system: Abdomen is nondistended, soft and nontender.  Normal bowel sounds heard. Central nervous system: Alert and oriented. No focal neurological deficits. Extremities: Symmetric 5 x 5 power. Skin: c section incision slightly tender.  Psychiatry:  Mood & affect appropriate.        Data Reviewed: I have personally reviewed following labs and imaging studies  CBC: Recent Labs  Lab 02/26/21 1215 02/27/21 1915 02/28/21 0437 03/01/21 0443 03/02/21 0320  WBC 12.9* 13.4* 12.1* 10.0 9.2  NEUTROABS 11.5*  --   --   --   --   HGB 7.4* 8.0* 7.9* 8.0* 7.9*  HCT 23.0* 25.1* 24.2* 24.7* 24.7*  MCV 89.5 90.9 90.3 91.5 92.2  PLT 260 307 308 314 321     Basic Metabolic Panel: Recent Labs  Lab 02/25/21  3557 02/26/21 1215 02/27/21 0412 02/28/21 0437 03/01/21 0443 03/02/21 1032 03/03/21 0450  NA 135   < > 139 139 136 139 141  K 4.9   < > 4.4 4.6 4.3 3.7 4.1  CL 99   < > 101 99 97* 97* 100  CO2 27   < > 32 32 34* 35* 36*  GLUCOSE 238*   < > 326* 214* 329* 142* 96  BUN 57*   < > 61* 60* 60* 60* 57*  CREATININE 1.46*   < > 1.39* 1.35* 1.26* 1.32* 1.25*  CALCIUM 8.6*   < > 8.3* 8.5* 8.2* 8.6* 8.4*  MG 2.3  --   --   --   --   --   --   PHOS 4.1  --   --   --   --   --   --    < > = values in this interval not displayed.     GFR: Estimated Creatinine Clearance: 59.2 mL/min (A) (by C-G formula based on SCr of 1.25 mg/dL (H)).  Liver Function Tests: Recent Labs  Lab 02/25/21 0421 02/28/21 0437  AST  --  23  ALT  --  23  ALKPHOS  --  102  BILITOT  --  1.0  PROT  --  5.2*  ALBUMIN 2.0* 2.1*     CBG: Recent Labs  Lab 02/28/21 0812 03/01/21 0442 03/01/21 1743 03/01/21 1800 03/02/21 1812  GLUCAP 174* 319* 39* 57* 333*      No results found for this or any previous visit (from the past 240 hour(s)).        Radiology  Studies: No results found.      Scheduled Meds:  sodium chloride   Intravenous Once   alteplase  2 mg Intracatheter Once   amLODipine  5 mg Oral Daily   Chlorhexidine Gluconate Cloth  6 each Topical Daily   cholecalciferol  1,000 Units Oral Daily   famotidine  20 mg Oral BID   furosemide  60 mg Intravenous Q12H   hydrocortisone  10 mg Oral Q24H   And   hydrocortisone  5 mg Oral Q24H   insulin aspart  0-6 Units Subcutaneous Q4H   insulin aspart  2 Units Subcutaneous TID WC   insulin glargine-yfgn  15 Units Subcutaneous Daily   measles, mumps & rubella vaccine  0.5 mL Subcutaneous Once   pantoprazole  40 mg Oral Daily   prenatal multivitamin  1 tablet Oral Q1200   simethicone  80 mg Oral TID PC   sodium chloride flush  10-40 mL Intracatheter Q12H   Tdap  0.5 mL Intramuscular Once   Continuous Infusions:   LOS: 77 days        Kathlen Mody, MD [facility]   To contact the attending provider between 7A-7P or the covering provider during after hours 7P-7A, please log into the web site [email or web address] and access using universal [place] password for that web site. If you do not have the password, please call the hospital operator.  03/03/2021, 5:26 PM

## 2021-03-03 NOTE — Progress Notes (Signed)
Inpatient Diabetes Program Recommendations  AACE/ADA: New Consensus Statement on Inpatient Glycemic Control (2015)  Target Ranges:  Prepandial:   less than 140 mg/dL      Peak postprandial:   less than 180 mg/dL (1-2 hours)      Critically ill patients:  140 - 180 mg/dL   Lab Results  Component Value Date   GLUCAP 333 (H) 03/02/2021   HGBA1C 5.4 02/21/2021    Review of Glycemic Control Results for Ashley Camacho, Ashley Camacho (MRN [id number]) as of 03/03/2021 09:14  Ref. Range 03/01/2021 04:42 03/01/2021 17:43 03/01/2021 18:00 03/02/2021 18:12  Glucose-Capillary Latest Ref Range: 70 - 99 mg/dL 235 (H) 39 (LL) 57 (L) 333 (H)   Diabetes history: DM 1 Outpatient Diabetes medications: Omnipod with Novolog insulin  Most recent insulin pump rates from [facility]: 0.05 units/hr= 1.2 units in 24 hours, 1 units drops blood sugar 100 mg/dL, 1 units per 30 grams of CHO   In DM coordinator note from 11/20/2020: "Current pump settings are: 12-6am  0.15 units/hour 6am-12am 0.25 units/hour Total 5.4 units/24 hours  CF 80 (1 unit drops glucose 80 mg/dl) I:C 3:61 (1 unit covers 20 grams of carbs) Target BG 100 mg/dl"   Current orders for Inpatient glycemic control: Semglee 15 units daily, Novolog 0-9 units TID with meals, Novolog 0-5 units QHS;  Cortef 10 mg QD, 5 mg QD   Inpatient Diabetes Program Recommendations:  Consider changing: -Novolog 0-6 units Q4H -Novolog 2 units TID (assuming patient is consuming >50% of meals) -Semglee 14 units QA  Noted patient experiencing labile blood sugars especially elevations occurring in the evening. Patient is sensitive to insulin and is given larger doses of short acting insulin to cover, thus contributing to lows. Would continue VERY sensitive Q4H correction until patient is discharged to prevent lability especially in the presence of tapered steroids.  @0900 : Discussed current orders, plan of care, insulin and plan for insulin pump with RN. At bedside, patient states that  the insulin pump arrived yesterday in a big cooler but that no one was able to bring it due to transportation issues. @930 :Spoke with Dr given plan of care and recommendations. Attempt to obtain insulin pump that patient states has been shipped to her home address. Orders received. Will plan to go pick up pump from patient's home. Spoke with patient again to ensure all supplies will be left on front porch ready for pick up. Confusion on delivered package at patient's home being insulin pump. Verified with patient's boyfriend that the insulin pump was NOT delivered. Discussed plan with [facility] care services, DME company and [place] Medicaid. Product has been approved by [facility] Medicaid, however, company cannot ship product due to inpatient stay. Verified through Diabetes services manager for attempted work around to avoid potential rehospitalization; however authorization for overnight shipping was denies until patient discharged from hospital.  In this case, patient to be discharged on SQ insulin. Contacted insulin pump trainer to inform that she would need to reach out to patient to schedule. Patient is schedule for outpatient endo appointment.  Secure chat sent to medical team to inform.  Following.    Thanks, [facility], MSN, RNC-OB Diabetes Coordinator [phone] (8a-5p)

## 2021-03-03 NOTE — Progress Notes (Signed)
Gynecology Progress Note  Admission Date: 12/16/2020 Current Date: 03/03/2021 7:22 AM  Ashley Camacho is a 30 y.o. [id number] POD 13  s/p repeat LTCS with BTL at 34.1 weeks.  Pt developed postoperative hematoma which is being managed expectantly.  History complicated by: Patient Active Problem List   Diagnosis Date Noted   Postoperative hematoma involving genitourinary system following genitourinary procedure    S/P cesarean section 02/17/2021   Acute esophagitis    Leg edema    History of preterm delivery, currently pregnant    Pre-existing type 1 diabetes mellitus during pregnancy in third trimester    Anemia during pregnancy    Type 1 diabetes mellitus affecting pregnancy in second trimester, antepartum    HFrEF (heart failure with reduced ejection fraction) (HCC)    Acute deep vein thrombosis (DVT) of brachial vein of right upper extremity ([place]) 12/17/2020   Anxiety 12/17/2020   Diabetic retinopathy ([place]) 12/17/2020   Acute on chronic combined systolic and diastolic CHF (congestive heart failure) ([place])    Hyperemesis 12/13/2020   DKA, type 1, not at goal [facility]) 12/13/2020   Sunburn 12/13/2020   Hypothyroidism 12/10/2020   Malnutrition ([place]) 12/10/2020   Vitreous hemorrhage of right eye ([place]) 12/10/2020   Pleural effusion associated with pulmonary infection 11/25/2020   Pneumonia affecting pregnancy 11/24/2020   Diabetes mellitus affecting pregnancy, second trimester 11/24/2020   Edema 11/24/2020   Decreased urine output 11/24/2020   Shortness of breath    Zinc deficiency    SOB (shortness of breath)    Social problem 11/21/2020   Nausea and vomiting during pregnancy prior to [redacted] weeks gestation 11/21/2020   Low serum vitamin B12    Delivery with history of C-section 11/20/2020   Absolute anemia    Acute kidney injury ([place])    Nausea and vomiting during pregnancy 10/23/2020   History of premature delivery, currently pregnant, second trimester 10/13/2020   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  Bandage exchanged.  Subjective:  Pt seen this morning.  Per pt she is waiting on an insulin pump.  Otherwise, she is doing well.  She is eating and drinking as well as passing gas.  Pain is controlled with oxycodone.  Objective:   Vitals:   03/02/21 1250 03/02/21 1556 03/02/21 2143 03/03/21 0557  BP: (!) 155/68 (!) 138/58 (!) 146/63 (!) 145/56  Pulse: 95 (!) 105 93 87  Resp: 18 18    Temp: 97.7 F (36.5 C) 98.7 F (37.1 C) 98.7 F (37.1 C) 98 F (36.7 C)  TempSrc: Oral Oral Oral Oral  SpO2: 100% 98% 100% 98%  Weight:      Height:        Temp:  [97.7 F (36.5 C)-98.7 F (37.1 C)] 98 F (36.7 C) (10/11 0557) Pulse Rate:  [87-105] 87 (10/11 0557) Resp:  [18] 18 (10/10 1556) BP: (138-155)/(56-68) 145/56 (10/11 0557) SpO2:  [98 %-100 %] 98 % (10/11 0557) I/O last 3 completed shifts: In: 2 [P.O.:826] Out: 2482 [Urine:4350] No intake/output data recorded.  Intake/Output Summary (Last 24 hours) at 03/03/2021 5003 Last data filed at 03/03/2021  0600 Gross per 24 hour  Intake 706 ml  Output 2300 ml  Net -1594 ml     Current Vital Signs 24h Vital Sign Ranges  T 98 F (36.7 C) Temp  Avg: 98.2 F (36.8 C)  Min: 97.7 F (36.5 C)  Max: 98.7 F (37.1 C)  BP (!) 145/56  BP  Min: 138/58  Max: 155/68  HR 87 Pulse  Avg: 94  Min: 87  Max: 105  RR 18 Resp  Avg: 18  Min: 18  Max: 18  SaO2 98 % Room Air SpO2  Avg: 98.8 %  Min: 98 %  Max: 100 %       24 Hour I/O Current Shift I/O  Time Ins Outs 10/10 0701 - 10/11  0700 In: 706 [P.O.:706] Out: 2300 [Urine:2300] No intake/output data recorded.   Patient Vitals for the past 12 hrs:  BP Temp Temp src Pulse SpO2  03/03/21 0557 (!) 145/56 98 F (36.7 C) Oral 87 98 %  03/02/21 2143 (!) 146/63 98.7 F (37.1 C) Oral 93 100 %     Patient Vitals for the past 24 hrs:  BP Temp Temp src Pulse Resp SpO2  03/03/21 0557 (!) 145/56 98 F (36.7 C) Oral 87 -- 98 %  03/02/21 2143 (!) 146/63 98.7 F (37.1 C) Oral 93 -- 100 %  03/02/21 1556 (!) 138/58 98.7 F (37.1 C) Oral (!) 105 18 98 %  03/02/21 1250 (!) 155/68 97.7 F (36.5 C) Oral 95 18 100 %  03/02/21 0753 140/61 98 F (36.7 C) Oral 90 18 98 %    Physical exam: General appearance: alert, cooperative, appears stated age, and no distress Abdomen: soft, non-tender; bowel sounds normal; no masses,  no organomegaly and Pfannenstiel incision with no active drainage this AM GU: No gross VB Lungs: clear to auscultation bilaterally Heart: regular rate and rhythm Extremities: mild edema, no calf pain bilaterally Skin: WNL Psych: appropriate Neurologic: Grossly normal  Medications Current Facility-Administered Medications  Medication Dose Route Frequency Provider Last Rate Last Admin   0.9 %  sodium chloride infusion (Manually program via Guardrails IV Fluids)   Intravenous Once Woodroe Mode, MD       acetaminophen (TYLENOL) tablet 1,000 mg  1,000 mg Oral Q6H PRN Aletha Halim, MD   1,000 mg at 02/27/21 1049   alteplase (CATHFLO ACTIVASE) injection 2 mg  2 mg Intracatheter Once Wendee Beavers T, MD       amLODipine (NORVASC) tablet 5 mg  5 mg Oral Daily Donnamae Jude, MD   5 mg at 03/02/21 4098   bisacodyl (DULCOLAX) suppository 10 mg  10 mg Rectal Daily PRN Aletha Halim, MD   10 mg at 02/27/21 2035   Chlorhexidine Gluconate Cloth 2 % PADS 6 each  6 each Topical Daily Renard Matter, MD   6 each at 03/02/21 1312   cholecalciferol (VITAMIN D3) tablet 1,000 Units  1,000 Units Oral Daily Renard Matter, MD    1,000 Units at 03/02/21 0915   coconut oil  1 application Topical PRN Renard Matter, MD       witch hazel-glycerin (TUCKS) pad 1 application  1 application Topical PRN Renard Matter, MD       And   dibucaine (NUPERCAINAL) 1 % rectal ointment 1 application  1 application Rectal PRN Renard Matter, MD       diphenhydrAMINE (BENADRYL) capsule 25 mg  25 mg Oral Q6H PRN Renard Matter, MD       famotidine (  PEPCID) tablet 20 mg  20 mg Oral BID Renard Matter, MD   20 mg at 03/02/21 2134   furosemide (LASIX) injection 60 mg  60 mg Intravenous Q12H Hosie Poisson, MD   60 mg at 03/02/21 2134   hydrALAZINE (APRESOLINE) tablet 25 mg  25 mg Oral Q8H PRN Hosie Poisson, MD       hydrocortisone (CORTEF) tablet 10 mg  10 mg Oral Q24H Hosie Poisson, MD   10 mg at 03/02/21 3888   And   hydrocortisone (CORTEF) tablet 5 mg  5 mg Oral Q24H Hosie Poisson, MD   5 mg at 03/02/21 1832   HYDROmorphone (DILAUDID) injection 1 mg  1 mg Intravenous Q2H PRN Radene Gunning, MD   1 mg at 03/02/21 2132   insulin aspart (novoLOG) injection 0-6 Units  0-6 Units Subcutaneous TID WC Hosie Poisson, MD   4 Units at 03/02/21 1833   insulin glargine-yfgn (SEMGLEE) injection 15 Units  15 Units Subcutaneous Daily Hosie Poisson, MD       measles, mumps & rubella vaccine (MMR) injection 0.5 mL  0.5 mL Subcutaneous Once Renard Matter, MD       menthol-cetylpyridinium (CEPACOL) lozenge 3 mg  1 lozenge Oral Q2H PRN Renard Matter, MD       metoCLOPramide (REGLAN) tablet 5 mg  5 mg Oral TID WC PRN Wendee Beavers T, MD       oxyCODONE (Oxy IR/ROXICODONE) immediate release tablet 5-10 mg  5-10 mg Oral Q4H PRN Renard Matter, MD   5 mg at 03/02/21 1759   pantoprazole (PROTONIX) EC tablet 40 mg  40 mg Oral Daily Renard Matter, MD   40 mg at 03/02/21 0915   prenatal multivitamin tablet 1 tablet  1 tablet Oral Q1200 Renard Matter, MD   1 tablet at 03/02/21 1308   senna-docusate (Senokot-S) tablet 2 tablet  2 tablet Oral QHS PRN Aletha Halim, MD   2 tablet at 03/01/21 0825    simethicone (MYLICON) chewable tablet 80 mg  80 mg Oral TID PC Renard Matter, MD   80 mg at 03/02/21 1759   simethicone (MYLICON) chewable tablet 80 mg  80 mg Oral PRN Renard Matter, MD   80 mg at 03/01/21 1953   sodium chloride (OCEAN) 0.65 % nasal spray 1 spray  1 spray Each Nare PRN Renard Matter, MD       sodium chloride flush (NS) 0.9 % injection 10-40 mL  10-40 mL Intracatheter Q12H Renard Matter, MD   10 mL at 03/02/21 2234   Tdap (BOOSTRIX) injection 0.5 mL  0.5 mL Intramuscular Once Renard Matter, MD       zolpidem [facility]) tablet 5 mg  5 mg Oral QHS PRN Renard Matter, MD          Labs  Recent Labs  Lab 02/28/21 [phone] 03/01/21 0443 03/02/21 0320  WBC 12.1* 10.0 9.2  HGB 7.9* 8.0* 7.9*  HCT 24.2* 24.7* 24.7*  PLT 308 314 321    Recent Labs  Lab 02/28/21 0437 03/01/21 0443 03/02/21 1032 03/03/21 0450  NA 139 136 139 141  K 4.6 4.3 3.7 4.1  CL 99 97* 97* 100  CO2 32 34* 35* 36*  BUN 60* 60* 60* 57*  CREATININE 1.35* 1.26* 1.32* 1.25*  CALCIUM 8.5* 8.2* 8.6* 8.4*  PROT 5.2*  --   --   --   BILITOT 1.0  --   --   --   ALKPHOS 102  --   --   --  ALT 23  --   --   --   AST 23  --   --   --   GLUCOSE 214* 329* 142* 96    Radiology N/a  Assessment & Plan:  POD 13 s/p repeat LTCS with BTL, post op hematoma Hematoma appears to be breaking down with some liquefied clot escaping from the incision.   No signs of infection Pt stable from gyn standpoint  Code Status: Full Code   Lynnda Shields, MD Attending Center for [facility])

## 2021-03-04 ENCOUNTER — Other Ambulatory Visit (HOSPITAL_COMMUNITY): Payer: Self-pay

## 2021-03-04 ENCOUNTER — Telehealth: Payer: Self-pay | Admitting: Radiology

## 2021-03-04 DIAGNOSIS — N99840 Postprocedural hematoma of a genitourinary system organ or structure following a genitourinary system procedure: Secondary | ICD-10-CM | POA: Diagnosis not present

## 2021-03-04 LAB — BASIC METABOLIC PANEL
Anion gap: 8 (ref 5–15)
BUN: 58 mg/dL — ABNORMAL HIGH (ref 6–20)
CO2: 34 mmol/L — ABNORMAL HIGH (ref 22–32)
Calcium: 8.4 mg/dL — ABNORMAL LOW (ref 8.9–10.3)
Chloride: 97 mmol/L — ABNORMAL LOW (ref 98–111)
Creatinine, Ser: 1.43 mg/dL — ABNORMAL HIGH (ref 0.44–1.00)
GFR, Estimated: 51 mL/min — ABNORMAL LOW (ref >60)
Glucose, Bld: 194 mg/dL — ABNORMAL HIGH (ref 70–99)
Potassium: 4.1 mmol/L (ref 3.5–5.1)
Sodium: 139 mmol/L (ref 135–145)

## 2021-03-04 MED ORDER — HYDROCORTISONE 10 MG PO TABS
10.0000 mg | ORAL_TABLET | Freq: Every day | ORAL | 0 refills | Status: DC
  Filled 2021-03-04: qty 14, 14d supply, fill #0

## 2021-03-04 MED ORDER — HYDROCORTISONE 5 MG PO TABS
5.0000 mg | ORAL_TABLET | Freq: Every day | ORAL | 0 refills | Status: DC
  Filled 2021-03-04: qty 14, 14d supply, fill #0

## 2021-03-04 MED ORDER — ACETAMINOPHEN 500 MG PO TABS
1000.0000 mg | ORAL_TABLET | Freq: Four times a day (QID) | ORAL | 3 refills | Status: DC | PRN
  Filled 2021-03-04: qty 30, 4d supply, fill #0

## 2021-03-04 MED ORDER — INSULIN GLARGINE 100 UNIT/ML SOLOSTAR PEN
14.0000 [IU] | PEN_INJECTOR | Freq: Every day | SUBCUTANEOUS | 11 refills | Status: DC
  Filled 2021-03-04: qty 6, 42d supply, fill #0

## 2021-03-04 MED ORDER — FUROSEMIDE 40 MG PO TABS
40.0000 mg | ORAL_TABLET | Freq: Every day | ORAL | 1 refills | Status: DC
  Filled 2021-03-04: qty 30, 30d supply, fill #0

## 2021-03-04 MED ORDER — AMLODIPINE BESYLATE 5 MG PO TABS
5.0000 mg | ORAL_TABLET | Freq: Every day | ORAL | 3 refills | Status: DC
  Filled 2021-03-04: qty 30, 30d supply, fill #0

## 2021-03-04 MED ORDER — INSULIN ASPART 100 UNIT/ML FLEXPEN
2.0000 [IU] | PEN_INJECTOR | Freq: Three times a day (TID) | SUBCUTANEOUS | 11 refills | Status: DC
  Filled 2021-03-04: qty 15, 34d supply, fill #0

## 2021-03-04 MED ORDER — ENOXAPARIN SODIUM 40 MG/0.4ML IJ SOSY
40.0000 mg | PREFILLED_SYRINGE | INTRAMUSCULAR | 2 refills | Status: DC
Start: 2021-03-04 — End: 2021-04-28
  Filled 2021-03-04: qty 13.6, 34d supply, fill #0

## 2021-03-04 MED ORDER — METOCLOPRAMIDE HCL 5 MG PO TABS
5.0000 mg | ORAL_TABLET | Freq: Three times a day (TID) | ORAL | 2 refills | Status: DC | PRN
  Filled 2021-03-04: qty 60, 20d supply, fill #0

## 2021-03-04 MED ORDER — OXYCODONE HCL 5 MG PO TABS
5.0000 mg | ORAL_TABLET | ORAL | 0 refills | Status: DC | PRN
  Filled 2021-03-04: qty 20, 4d supply, fill #0

## 2021-03-04 MED ORDER — FAMOTIDINE 20 MG PO TABS
20.0000 mg | ORAL_TABLET | Freq: Two times a day (BID) | ORAL | 1 refills | Status: DC
  Filled 2021-03-04: qty 60, 30d supply, fill #0

## 2021-03-04 MED ORDER — FUROSEMIDE 40 MG PO TABS: 40.0000 mg | ORAL_TABLET | Freq: Every day | ORAL | Status: DC

## 2021-03-04 MED ORDER — BISACODYL 10 MG RE SUPP
10.0000 mg | Freq: Every day | RECTAL | 0 refills | Status: DC | PRN
  Filled 2021-03-04: qty 12, 12d supply, fill #0

## 2021-03-04 MED ORDER — INSULIN ASPART 100 UNIT/ML IJ SOLN
0.0000 [IU] | INTRAMUSCULAR | 11 refills | Status: DC
  Filled 2021-03-04: qty 10, 28d supply, fill #0

## 2021-03-04 MED ORDER — INSULIN PEN NEEDLE 32G X 4 MM MISC
2 refills | Status: DC
  Filled 2021-03-04: qty 200, 30d supply, fill #0

## 2021-03-04 NOTE — Progress Notes (Signed)
PROGRESS NOTE    Ashley Camacho  JQZ:[id number] DOB: 08-20-1990 DOA: 12/16/2020 PCP: Hildred Laser, MD    No chief complaint on file.   Brief Narrative:  30 y.o. G2P0101 with a history of T1DM, chronic HFrEF and recent hospitalization at [facility] on 6/30 for N/V and rhinovirus pneumonia requiring transfer to [facility].  She was discharged home from [facility] on 7/20 only to return on 7/22 with starvation ketoacidosis in the setting of nausea and vomiting likely due to uncontrolled DM-1 and gastroparesis, and brachial vein DVT.She was a started on TPN through PICC line and transferred to [facility] for cortrak and TF initiation.    Hospital course noteworthy of systolic CHF exacerbation (on IV diuretics), AI (p.o. Cortef), ABLA, hematemesis in the setting of grade C esophagitis on EGD, hematuria (seems to have resolved), moderate right hydronephrosis (improved), Enterococcus faecalis UTI (completed treatment), acute urinary retention (resolved)...   Patient underwent cesarean section on 9/27.  Had postop blood loss with large bladder flap hematoma measuring requiring blood transfusions.  CTA did not show active extravasation.  H&H seems to be stable now. Pt seen, diuresing well with increased dose of IV lasix . Echocardiogram reviewed with the patient.   Discussed with her endocrinologist on the phone and she should be able to go home with atleast 2 weeks of cortef 10 mg in the morning and 5 mg in the evening.  Ideally it would be best if she gets discharged on the insulin pump, her endocrinology office has mailed it but she hasn't received it yet. Since she doesn't have the insulin pump, recommend watching her on Semglee 14units daily with 1 to 2 units of Novolog TIDAC and Novolog 0 to 6 units every 4 hours for SSI for 24 hours to 48 hours and can be discharged home after we receive cardiology recommendations on the diuretic dose for discharge.  Of note she has an appointment with her endocrinologist Dr Amalia Hailey on  October 18th ( they will see her anytime) and she cannot make that appointment, she has another appt on 27 th of October at 1:30 pm.    Assessment & Plan:   Active Problems:   Delivery with history of C-section   Acute kidney injury (HCC)   Hyperemesis   DKA, type 1, not at goal [facility])   Acute deep vein thrombosis (DVT) of brachial vein of right upper extremity (HCC)   Vitreous hemorrhage of right eye (HCC)   Gastroparesis due to DM (HCC)   Diabetic retinopathy (HCC)   Acute on chronic combined systolic and diastolic CHF (congestive heart failure) (HCC)   Type 1 diabetes mellitus affecting pregnancy in second trimester, antepartum   HFrEF (heart failure with reduced ejection fraction) (HCC)   Anemia during pregnancy   Pre-existing type 1 diabetes mellitus during pregnancy in third trimester   History of preterm delivery, currently pregnant   Leg edema   Acute esophagitis   S/P cesarean section   Postoperative hematoma involving genitourinary system following genitourinary procedure  Acute systolic heart failure with improved EF/volume overload  Pt on 60 mg BID of IV lasix, and diuresing well.   Strict intake and output and fluid restriction to .  she has diuresed about 18.4 lit since admission.  Appreciate cardiology input.  Further management per cardiology.  Postsurgical anemia of blood loss/hematoma Hemoglobin around 8.  Transfuse to keep hemoglobin greater than 7.  Acute urinary retention/bilateral hydronephrosis Resolved.   Uncontrolled type 1 diabetes with hyperglycemia and hypoglycemia Currently she  is on Semglee 14 units, novolog 2 units tidac and novolog 0 to 6 units for SSI.  Will need to be discharged on current regimen.  Patient to receive insulin pump however per diabetes coordinator, the pump cannot be shipped until patient literally gets discharged from the hospital.  Once she receives the pump, she will start on pump and discontinue subcutaneous  insulin.  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 Continue the same for at least 2 weeks. She will need close follow-up with endocrinology postdischarge and has scheduled an appt for October 18 th . Pt can go the appt anytime and they will try to squeeze her in to see the Dr Amalia Hailey.   Hyperkalemia Resolved  Hyponatremia probably from hypervolemia/fluid overload Resolved.   Hypertension  Bp parameters have improved. .  Cardiology added 2.5 of amlodipine.   Right brachial DVT Recannulation of the blood vessel.  Patient's heparin was discontinued due to hematoma.  Hemoglobin has remained stable.  If patient is deemed safe from hematoma perspective and low risk for bleeding, anticoagulation can be considered but if she is high risk, anticoagulation should be held.  Final decision at the discretion of primary service/OB.  GERD .  Continue with PPI  AKI/azotemia Resolved.  Leukocytosis  Improving.   IUP: S/P C section delivery at 34 weeks.   Hypokalemia and hypomagnesemia:  Replaced.   Enterococcus faecalis UTI:  Completed the course of treatment.   Acute thrombocytopenia:  Resolved.   DVT prophylaxis: SCDs Code Status: full code.  Family Communication: none at bedside.  Disposition:   Status is: Inpatient  Remains inpatient appropriate because:IV treatments appropriate due to intensity of illness or inability to take PO        Antimicrobials: none.    Subjective: Patient seen and examined.  She has no complaints.  She is eager to go home and is looking forward to that.  Objective: Vitals:   03/03/21 2348 03/04/21 0359 03/04/21 0800 03/04/21 1129  BP: (!) 138/56 (!) 143/53 (!) 146/60 (!) 128/51  Pulse: 81 78 86 84  Resp: 18 18 18 18   Temp: 97.9 F (36.6 C) 98.3 F (36.8 C) 97.9 F (36.6  C) 97.8 F (36.6 C)  TempSrc: Oral Oral Oral Oral  SpO2: 98% 99% 99% 99%  Weight:      Height:        Intake/Output Summary (Last 24 hours) at 03/04/2021 1206 Last data filed at 03/04/2021 1134 Gross per 24 hour  Intake 270 ml  Output 2900 ml  Net -2630 ml    Filed Weights   01/26/21 0530 02/08/21 1157 02/26/21 1448  Weight: 80.6 kg 76.1 kg 69.5 kg    Examination:  General exam: Appears calm and comfortable  Respiratory system: Clear to auscultation. Respiratory effort normal. Cardiovascular system: S1 & S2 heard, RRR. No JVD, murmurs, rubs, gallops or clicks.  +2-3 pitting edema bilateral lower extremity Gastrointestinal system: Abdomen is nondistended, soft and nontender. No organomegaly or masses felt. Normal bowel sounds heard. Central nervous system: Alert and oriented. No focal neurological deficits. Extremities: Symmetric 5 x 5 power. Skin: No rashes, lesions or ulcers.  Psychiatry: Judgement and insight appear normal. Mood & affect appropriate.    Data Reviewed: I have personally reviewed following labs and imaging studies  CBC: Recent Labs  Lab 02/26/21 1215 02/27/21 1915 02/28/21 04/30/21  03/01/21 0443 03/02/21 0320  WBC 12.9* 13.4* 12.1* 10.0 9.2  NEUTROABS 11.5*  --   --   --   --   HGB 7.4* 8.0* 7.9* 8.0* 7.9*  HCT 23.0* 25.1* 24.2* 24.7* 24.7*  MCV 89.5 90.9 90.3 91.5 92.2  PLT 260 307 308 314 321     Basic Metabolic Panel: Recent Labs  Lab 02/28/21 0437 03/01/21 0443 03/02/21 1032 03/03/21 0450 03/04/21 0409  NA 139 136 139 141 139  K 4.6 4.3 3.7 4.1 4.1  CL 99 97* 97* 100 97*  CO2 32 34* 35* 36* 34*  GLUCOSE 214* 329* 142* 96 194*  BUN 60* 60* 60* 57* 58*  CREATININE 1.35* 1.26* 1.32* 1.25* 1.43*  CALCIUM 8.5* 8.2* 8.6* 8.4* 8.4*     GFR: Estimated Creatinine Clearance: 51.8 mL/min (A) (by C-G formula based on SCr of 1.43 mg/dL (H)).  Liver Function Tests: Recent Labs  Lab 02/28/21 0437  AST 23  ALT 23  ALKPHOS 102  BILITOT  1.0  PROT 5.2*  ALBUMIN 2.1*     CBG: Recent Labs  Lab 02/28/21 0812 03/01/21 0442 03/01/21 1743 03/01/21 1800 03/02/21 1812  GLUCAP 174* 319* 39* 57* 333*      No results found for this or any previous visit (from the past 240 hour(s)).        Radiology Studies: No results found.      Scheduled Meds:  sodium chloride   Intravenous Once   alteplase  2 mg Intracatheter Once   amLODipine  5 mg Oral Daily   Chlorhexidine Gluconate Cloth  6 each Topical Daily   cholecalciferol  1,000 Units Oral Daily   famotidine  20 mg Oral BID   furosemide  40 mg Oral Daily   hydrocortisone  10 mg Oral Q24H   And   hydrocortisone  5 mg Oral Q24H   insulin aspart  0-6 Units Subcutaneous Q4H   insulin aspart  2 Units Subcutaneous TID WC   insulin glargine-yfgn  14 Units Subcutaneous Daily   measles, mumps & rubella vaccine  0.5 mL Subcutaneous Once   pantoprazole  40 mg Oral Daily   prenatal multivitamin  1 tablet Oral Q1200   simethicone  80 mg Oral TID PC   sodium chloride flush  10-40 mL Intracatheter Q12H   Tdap  0.5 mL Intramuscular Once   Continuous Infusions:   LOS: 78 days   Hughie Closs, MD [facility]  To contact the attending provider between 7A-7P or the covering provider during after hours 7P-7A, please log into the web site [email or web address] and access using universal [place] password for that web site. If you do not have the password, please call the hospital operator.  03/04/2021, 12:06 PM

## 2021-03-04 NOTE — Consult Note (Signed)
   [facility] Inpatient Consult   03/04/2021  Ashley Camacho 19-Dec-1990 [id number]   Managed Medicaid: [place] [employer]  Follow up: referral made to Managed Medicaid team  Spoke with patient at the hospital phone, HIPAA verified and contact information noted.  She states it best to call and leave a voicemail.  Patient denies any current issues with transportation, food insecurity needs at this time.  Plan:  Will make the Managed Medicaid team aware of transitioning home and to follow up.  For questions,  Charlesetta Shanks, RN BSN CCM [facility]  [phone] business mobile phone Toll free office [phone]  Fax number: [phone] [email or web address]

## 2021-03-04 NOTE — Progress Notes (Signed)
Progress Note  Patient Name: Ashley Camacho Date of Encounter: 03/04/2021  Primary Cardiologist:   Thurmon Fair, MD   Subjective   Denies any new SOB.  Legs still swollen.   Inpatient Medications    Scheduled Meds:  sodium chloride   Intravenous Once   alteplase  2 mg Intracatheter Once   amLODipine  5 mg Oral Daily   Chlorhexidine Gluconate Cloth  6 each Topical Daily   cholecalciferol  1,000 Units Oral Daily   famotidine  20 mg Oral BID   furosemide  60 mg Intravenous Q12H   hydrocortisone  10 mg Oral Q24H   And   hydrocortisone  5 mg Oral Q24H   insulin aspart  0-6 Units Subcutaneous Q4H   insulin aspart  2 Units Subcutaneous TID WC   insulin glargine-yfgn  14 Units Subcutaneous Daily   measles, mumps & rubella vaccine  0.5 mL Subcutaneous Once   pantoprazole  40 mg Oral Daily   prenatal multivitamin  1 tablet Oral Q1200   simethicone  80 mg Oral TID PC   sodium chloride flush  10-40 mL Intracatheter Q12H   Tdap  0.5 mL Intramuscular Once   Continuous Infusions:  PRN Meds: acetaminophen, bisacodyl, coconut oil, witch hazel-glycerin **AND** dibucaine, diphenhydrAMINE, hydrALAZINE, HYDROmorphone (DILAUDID) injection, menthol-cetylpyridinium, metoCLOPramide, oxyCODONE, senna-docusate, simethicone, sodium chloride, zolpidem   Vital Signs    Vitals:   03/03/21 1639 03/03/21 2025 03/03/21 2348 03/04/21 0359  BP: (!) 132/56 (!) 132/49 (!) 138/56 (!) 143/53  Pulse: 88 86 81 78  Resp: 18 18 18 18   Temp: (!) 97.4 F (36.3 C) 98.4 F (36.9 C) 97.9 F (36.6 C) 98.3 F (36.8 C)  TempSrc: Oral  Oral Oral  SpO2: 100% 97% 98% 99%  Weight:      Height:        Intake/Output Summary (Last 24 hours) at 03/04/2021 0713 Last data filed at 03/04/2021 0400 Gross per 24 hour  Intake 30 ml  Output 2800 ml  Net -2770 ml   Filed Weights   01/26/21 0530 02/08/21 1157 02/26/21 1448  Weight: 80.6 kg 76.1 kg 69.5 kg    Telemetry    NA - Personally Reviewed  ECG     NA - Personally Reviewed  Physical Exam   GENERAL:  Well appearing NECK:  No jugular venous distention, waveform within normal limits, carotid upstroke brisk and symmetric, no bruits, no thyromegaly LUNGS:  Clear to auscultation bilaterally CHEST:  Unremarkable HEART:  PMI not displaced or sustained,S1 and S2 within normal limits, no S3, no S4, no clicks, no rubs, no murmurs ABD:  Flat, positive bowel sounds normal in frequency in pitch, no bruits, no rebound, no guarding, no midline pulsatile mass, no hepatomegaly, no splenomegaly EXT:  2 plus pulses throughout, moderate leg edema, no cyanosis no clubbing    Labs    Chemistry Recent Labs  Lab 02/28/21 0437 03/01/21 0443 03/02/21 1032 03/03/21 0450 03/04/21 0409  NA 139   < > 139 141 139  K 4.6   < > 3.7 4.1 4.1  CL 99   < > 97* 100 97*  CO2 32   < > 35* 36* 34*  GLUCOSE 214*   < > 142* 96 194*  BUN 60*   < > 60* 57* 58*  CREATININE 1.35*   < > 1.32* 1.25* 1.43*  CALCIUM 8.5*   < > 8.6* 8.4* 8.4*  PROT 5.2*  --   --   --   --  ALBUMIN 2.1*  --   --   --   --   AST 23  --   --   --   --   ALT 23  --   --   --   --   ALKPHOS 102  --   --   --   --   BILITOT 1.0  --   --   --   --   GFRNONAA 55*   < > 56* 60* 51*  ANIONGAP 8   < > 7 5 8    < > = values in this interval not displayed.     Hematology Recent Labs  Lab 02/28/21 0437 03/01/21 0443 03/02/21 0320  WBC 12.1* 10.0 9.2  RBC 2.68* 2.70* 2.68*  HGB 7.9* 8.0* 7.9*  HCT 24.2* 24.7* 24.7*  MCV 90.3 91.5 92.2  MCH 29.5 29.6 29.5  MCHC 32.6 32.4 32.0  RDW 15.1 15.1 15.0  PLT 308 314 321    Cardiac EnzymesNo results for input(s): TROPONINI in the last 168 hours. No results for input(s): TROPIPOC in the last 168 hours.   BNP Recent Labs  Lab 02/28/21 0437  BNP 715.6*     DDimer No results for input(s): DDIMER in the last 168 hours.   Radiology    No results found.  Cardiac Studies   ECHO:   1. Left ventricular ejection fraction, by  estimation, is 50 to 55%. The  left ventricle has low normal function. The left ventricle demonstrates  global hypokinesis. Left ventricular diastolic parameters were normal.   2. Right ventricular systolic function is normal. The right ventricular  size is normal. There is normal pulmonary artery systolic pressure. The  estimated right ventricular systolic pressure is 35.7 mmHg.   3. A small pericardial effusion is present. The pericardial effusion is  posterior to the left ventricle. There is no evidence of cardiac  tamponade.   4. The mitral valve is normal in structure. Moderate mitral valve  regurgitation. No evidence of mitral stenosis.   5. The aortic valve is normal in structure. Aortic valve regurgitation is  trivial. No aortic stenosis is present.   6. Pulmonic valve regurgitation is moderate.   7. The inferior vena cava is normal in size with greater than 50%  respiratory variability, suggesting right atrial pressure of 3 mmHg.   Patient Profile     30 y.o. female with history of type 1 diabetes mellitus heart failure with mildly reduced left ventricular systolic function of uncertain etiology (possibly nutritional due to severe emesis during pregnancy), adrenal insufficiency, severe hypoalbuminemia, improving following delivery of a 34-week daughter.  Assessment & Plan    ACUTE DIASTOLIC HF:  Intake and out put is net negative 20.1 liters.   Creat is up.   I will change the Lasix to PO in anticipation of her discharge.  She should be sent home with keep her feet elevated and reduce salt.  She needs a BMET per her primary provider.    HTN:  Norvasc started.   BP mildly elevated.  Needs BP cuff at home.     ANEMIA:  Hgb low.  Follow per primary team.     FOLLOW UP:  We will arrange.    For questions or updates, please contact [facility] Please consult [email or web address] for contact info under Cardiology/STEMI.   Signed, 37, MD  03/04/2021, 7:13 AM

## 2021-03-04 NOTE — Progress Notes (Signed)
CSW followed up with MOB in her room (117) to offer support and assess for needs, concerns, and resources; CSW inquired about how MOB was doing, MOB reported that she was doing good and denied any postpartum depression signs/symptoms. MOB shared that she was being discharged today and planned to stay with infant in the NICU until Friday. MOB reported that she has everything that she needs to stay with infant in the NICU. MOB requested meal vouchers, CSW agreed to place meal vouchers in infant's room. CSW inquired about any additional needs/concerns, MOB reported none. CSW inquired about any transportation barriers with visiting infant in the NICU once she goes home, MOB reported no transportation barriers at this time. CSW encouraged MOB to contact CSW if any needs/concerns arise. MOB confirmed having CSW contact information.    CSW will continue to offer support and resources to family while infant remains in NICU.    Clayden Withem, LCSW Clinical Social Worker [facility] Cell#: [phone]    

## 2021-03-04 NOTE — Progress Notes (Signed)
Patient discharged from unit. Discharge instructions given by previous RN, but this RN also reviewed medications, follow up appointments, and discharge instructions.   Patient discharged from unit ambulatory and with no further questions.   Quincy Simmonds, RN

## 2021-03-04 NOTE — Lactation Note (Addendum)
This note was copied from a baby's chart. Lactation Consultation Note Mother to d/c today. She has not pumped in past 2 days. We reviewed importance of frequent pumping to avoid her milk drying. She requested stork pump referral. RN placed order and LC reached out to [facility] pump rep to determine eligibility.  Will plan f/u in NICU for bf assistance prn.  1130: stork pump delivered to room.  Patient Name: Ashley Camacho [id number] Date: 03/04/2021 Reason for consult: Follow-up assessment;Other (Comment) (maternal d/c) Age:105 wk.o.   Feeding Mother's Current Feeding Choice: Breast Milk and Formula Nipple Type: Dr. Levert Feinstein Preemie   Lactation Tools Discussed/Used Pumping frequency: no pumping in past 24-hours  Interventions Interventions: Education   Consult Status Consult Status: Follow-up Date: 03/04/21 Follow-up type: In-patient   Elder Negus 03/04/2021, 9:42 AM

## 2021-03-04 NOTE — Telephone Encounter (Signed)
Left voicemail with New GYN appt information with Dr Crissie Reese with office information if needing to contact.

## 2021-03-05 DIAGNOSIS — E1065 Type 1 diabetes mellitus with hyperglycemia: Secondary | ICD-10-CM | POA: Diagnosis not present

## 2021-03-05 DIAGNOSIS — E1042 Type 1 diabetes mellitus with diabetic polyneuropathy: Secondary | ICD-10-CM | POA: Diagnosis not present

## 2021-03-05 DIAGNOSIS — O24012 Pre-existing diabetes mellitus, type 1, in pregnancy, second trimester: Secondary | ICD-10-CM | POA: Diagnosis not present

## 2021-03-06 ENCOUNTER — Ambulatory Visit: Payer: Self-pay

## 2021-03-06 NOTE — Lactation Note (Signed)
This note was copied from a baby's chart. Lactation Consultation Note LC called to room for concern about breast pump. Mother received a Stork Pump yesterday. According to the mother, it does not have a plug in and requires 8 batteries (not included). LC called [employer] for further information. Stork Pump rep requested that mother bring pump back to hospital for assessment. [facility] team will f/u tomorrow. No charge for this visit.   Patient Name: Ashley Camacho Date: 03/06/2021   Age:63 wk.o.   Elder Negus 03/06/2021, 5:24 PM

## 2021-03-07 ENCOUNTER — Ambulatory Visit: Payer: Self-pay

## 2021-03-07 NOTE — Lactation Note (Signed)
This note was copied from a baby's chart. Lactation Consultation Note  Patient Name: Ashley Camacho [id number] Date: 03/07/2021   Age:30 wk.o.  LC to follow up with mom, but she wasn't in baby's room at this time. This LC called mom and left a message (no answer) inquiring about her pumping status and if [employer] replaced her pump. Will attempt to F/U again tomorrow. NICU RN aware of the situation and will page NICU [facility] when mom arrives to the hospital.  Feeding Nipple Type: Dr. Levert Feinstein Preemie   [name] 03/07/2021, 8:11 PM

## 2021-03-08 ENCOUNTER — Ambulatory Visit: Payer: Self-pay

## 2021-03-08 NOTE — Lactation Note (Signed)
This note was copied from a baby's chart. Lactation Consultation Note  Patient Name: Ashley Camacho [id number] Date: 03/08/2021   Age:30 wk.o.  Mom came into the hospital briefly today, by the time LC came in the room, she had already left. Followed up regarding missing pump parts from [employer] and she voiced that she made a mistake, she didn't realized that the wall outlet was also included, she just didn't see it because it came on a separate box.  She continues to pump infrequently, discussed some strategies to increase her pumping frequency, NICU LC to F/U with her the next time she comes to the hospital. No charge.  Maternal Data   Pumping: 1 times/24 hours Amount: drops  Amel Kitch S Mickael Mcnutt 03/08/2021, 8:13 PM

## 2021-03-10 DIAGNOSIS — E039 Hypothyroidism, unspecified: Secondary | ICD-10-CM | POA: Diagnosis not present

## 2021-03-10 DIAGNOSIS — E271 Primary adrenocortical insufficiency: Secondary | ICD-10-CM | POA: Insufficient documentation

## 2021-03-10 DIAGNOSIS — R801 Persistent proteinuria, unspecified: Secondary | ICD-10-CM | POA: Diagnosis not present

## 2021-03-10 DIAGNOSIS — N289 Disorder of kidney and ureter, unspecified: Secondary | ICD-10-CM | POA: Diagnosis not present

## 2021-03-10 DIAGNOSIS — O1495 Unspecified pre-eclampsia, complicating the puerperium: Secondary | ICD-10-CM | POA: Diagnosis not present

## 2021-03-10 DIAGNOSIS — E43 Unspecified severe protein-calorie malnutrition: Secondary | ICD-10-CM | POA: Diagnosis not present

## 2021-03-10 DIAGNOSIS — D649 Anemia, unspecified: Secondary | ICD-10-CM | POA: Diagnosis not present

## 2021-03-10 DIAGNOSIS — R609 Edema, unspecified: Secondary | ICD-10-CM | POA: Diagnosis not present

## 2021-03-10 DIAGNOSIS — I5040 Unspecified combined systolic (congestive) and diastolic (congestive) heart failure: Secondary | ICD-10-CM | POA: Diagnosis not present

## 2021-03-10 DIAGNOSIS — E1143 Type 2 diabetes mellitus with diabetic autonomic (poly)neuropathy: Secondary | ICD-10-CM | POA: Diagnosis not present

## 2021-03-11 DIAGNOSIS — E10649 Type 1 diabetes mellitus with hypoglycemia without coma: Secondary | ICD-10-CM | POA: Insufficient documentation

## 2021-03-11 DIAGNOSIS — E162 Hypoglycemia, unspecified: Secondary | ICD-10-CM | POA: Insufficient documentation

## 2021-03-11 DIAGNOSIS — E43 Unspecified severe protein-calorie malnutrition: Secondary | ICD-10-CM | POA: Insufficient documentation

## 2021-03-11 DIAGNOSIS — I5040 Unspecified combined systolic (congestive) and diastolic (congestive) heart failure: Secondary | ICD-10-CM | POA: Insufficient documentation

## 2021-03-13 ENCOUNTER — Other Ambulatory Visit (HOSPITAL_COMMUNITY): Payer: Self-pay

## 2021-03-16 ENCOUNTER — Other Ambulatory Visit (HOSPITAL_COMMUNITY): Payer: Self-pay

## 2021-03-18 ENCOUNTER — Encounter: Payer: Medicaid Other | Admitting: Family Medicine

## 2021-03-19 ENCOUNTER — Encounter: Payer: Medicaid Other | Admitting: Physician Assistant

## 2021-03-21 NOTE — Progress Notes (Signed)
This encounter was created in error - please disregard.

## 2021-03-24 ENCOUNTER — Other Ambulatory Visit: Payer: Self-pay | Admitting: *Deleted

## 2021-03-24 NOTE — Patient Outreach (Signed)
Care Coordination  03/24/2021  Ashley Camacho [birth year] [id number]  Successful telephone outreach to patient via contact number. She state she was unaware she had a telephone appointment with this RNCM. Reviewed purpose of  Medicaid Managed - High Risk Care Management /Care Coordination program and managed Medicaid team member roles of RNCM, BSW , LCSW and pharmacist. Patient declined services at this time but did agree to review program information that will be mailed to her with this RNCM's contact information should she decide she would like to participate in the program.  Cranford Mon RN, CCM, CDCES [place]  [facility] Care Management Coordinator - Managed [place] High Risk [phone]

## 2021-04-02 DIAGNOSIS — E1042 Type 1 diabetes mellitus with diabetic polyneuropathy: Secondary | ICD-10-CM | POA: Diagnosis not present

## 2021-04-02 DIAGNOSIS — E1010 Type 1 diabetes mellitus with ketoacidosis without coma: Secondary | ICD-10-CM | POA: Diagnosis not present

## 2021-04-02 DIAGNOSIS — E1065 Type 1 diabetes mellitus with hyperglycemia: Secondary | ICD-10-CM | POA: Diagnosis not present

## 2021-04-02 DIAGNOSIS — E039 Hypothyroidism, unspecified: Secondary | ICD-10-CM | POA: Diagnosis not present

## 2021-04-02 DIAGNOSIS — E8889 Other specified metabolic disorders: Secondary | ICD-10-CM | POA: Diagnosis not present

## 2021-04-02 DIAGNOSIS — N289 Disorder of kidney and ureter, unspecified: Secondary | ICD-10-CM | POA: Diagnosis not present

## 2021-04-02 DIAGNOSIS — E271 Primary adrenocortical insufficiency: Secondary | ICD-10-CM | POA: Diagnosis not present

## 2021-04-02 DIAGNOSIS — D649 Anemia, unspecified: Secondary | ICD-10-CM | POA: Diagnosis not present

## 2021-04-02 DIAGNOSIS — E875 Hyperkalemia: Secondary | ICD-10-CM | POA: Diagnosis not present

## 2021-04-02 DIAGNOSIS — E10649 Type 1 diabetes mellitus with hypoglycemia without coma: Secondary | ICD-10-CM | POA: Diagnosis not present

## 2021-04-03 ENCOUNTER — Encounter: Payer: Medicaid Other | Admitting: Family

## 2021-04-27 ENCOUNTER — Observation Stay
Admission: EM | Admit: 2021-04-27 | Discharge: 2021-04-28 | Disposition: A | Discharge status: Home / Self Care | Payer: Medicaid Other | Attending: Internal Medicine | Admitting: Internal Medicine

## 2021-04-27 ENCOUNTER — Other Ambulatory Visit: Payer: Self-pay

## 2021-04-27 ENCOUNTER — Emergency Department: Payer: Medicaid Other

## 2021-04-27 DIAGNOSIS — E039 Hypothyroidism, unspecified: Secondary | ICD-10-CM | POA: Diagnosis not present

## 2021-04-27 DIAGNOSIS — E161 Other hypoglycemia: Secondary | ICD-10-CM | POA: Diagnosis not present

## 2021-04-27 DIAGNOSIS — X31XXXA Exposure to excessive natural cold, initial encounter: Secondary | ICD-10-CM | POA: Diagnosis not present

## 2021-04-27 DIAGNOSIS — Z79899 Other long term (current) drug therapy: Secondary | ICD-10-CM | POA: Insufficient documentation

## 2021-04-27 DIAGNOSIS — T68XXXA Hypothermia, initial encounter: Secondary | ICD-10-CM | POA: Diagnosis not present

## 2021-04-27 DIAGNOSIS — N179 Acute kidney failure, unspecified: Secondary | ICD-10-CM

## 2021-04-27 DIAGNOSIS — R404 Transient alteration of awareness: Secondary | ICD-10-CM | POA: Diagnosis not present

## 2021-04-27 DIAGNOSIS — Y900 Blood alcohol level of less than 20 mg/100 ml: Secondary | ICD-10-CM | POA: Insufficient documentation

## 2021-04-27 DIAGNOSIS — E162 Hypoglycemia, unspecified: Secondary | ICD-10-CM | POA: Diagnosis not present

## 2021-04-27 DIAGNOSIS — R531 Weakness: Secondary | ICD-10-CM | POA: Diagnosis not present

## 2021-04-27 DIAGNOSIS — I10 Essential (primary) hypertension: Secondary | ICD-10-CM | POA: Diagnosis not present

## 2021-04-27 DIAGNOSIS — R55 Syncope and collapse: Secondary | ICD-10-CM | POA: Diagnosis not present

## 2021-04-27 DIAGNOSIS — E1069 Type 1 diabetes mellitus with other specified complication: Secondary | ICD-10-CM

## 2021-04-27 DIAGNOSIS — I82409 Acute embolism and thrombosis of unspecified deep veins of unspecified lower extremity: Secondary | ICD-10-CM

## 2021-04-27 DIAGNOSIS — Z794 Long term (current) use of insulin: Secondary | ICD-10-CM | POA: Diagnosis not present

## 2021-04-27 DIAGNOSIS — Z20822 Contact with and (suspected) exposure to covid-19: Secondary | ICD-10-CM | POA: Insufficient documentation

## 2021-04-27 DIAGNOSIS — I110 Hypertensive heart disease with heart failure: Secondary | ICD-10-CM | POA: Diagnosis not present

## 2021-04-27 DIAGNOSIS — I5043 Acute on chronic combined systolic (congestive) and diastolic (congestive) heart failure: Secondary | ICD-10-CM | POA: Insufficient documentation

## 2021-04-27 DIAGNOSIS — E10649 Type 1 diabetes mellitus with hypoglycemia without coma: Secondary | ICD-10-CM | POA: Diagnosis not present

## 2021-04-27 DIAGNOSIS — E11649 Type 2 diabetes mellitus with hypoglycemia without coma: Secondary | ICD-10-CM | POA: Diagnosis not present

## 2021-04-27 DIAGNOSIS — E1010 Type 1 diabetes mellitus with ketoacidosis without coma: Secondary | ICD-10-CM | POA: Diagnosis present

## 2021-04-27 LAB — ETHANOL: Alcohol, Ethyl (B): <10 mg/dL (ref <10)

## 2021-04-27 LAB — URINALYSIS, ROUTINE W REFLEX MICROSCOPIC
Bilirubin Urine: NEGATIVE
Glucose, UA: 50 mg/dL — AB
Ketones, ur: NEGATIVE mg/dL
Leukocytes,Ua: NEGATIVE
Nitrite: NEGATIVE
Protein, ur: 30 mg/dL — AB
Specific Gravity, Urine: 1.018 (ref 1.005–1.030)
pH: 5 (ref 5.0–8.0)

## 2021-04-27 LAB — COMPREHENSIVE METABOLIC PANEL
ALT: 10 U/L (ref 0–44)
AST: 26 U/L (ref 15–41)
Albumin: 4 g/dL (ref 3.5–5.0)
Alkaline Phosphatase: 62 U/L (ref 38–126)
Anion gap: 6 (ref 5–15)
BUN: 33 mg/dL — ABNORMAL HIGH (ref 6–20)
CO2: 21 mmol/L — ABNORMAL LOW (ref 22–32)
Calcium: 9 mg/dL (ref 8.9–10.3)
Chloride: 111 mmol/L (ref 98–111)
Creatinine, Ser: 1.34 mg/dL — ABNORMAL HIGH (ref 0.44–1.00)
GFR, Estimated: 55 mL/min — ABNORMAL LOW (ref >60)
Glucose, Bld: 121 mg/dL — ABNORMAL HIGH (ref 70–99)
Potassium: 5.8 mmol/L — ABNORMAL HIGH (ref 3.5–5.1)
Sodium: 138 mmol/L (ref 135–145)
Total Bilirubin: 0.7 mg/dL (ref 0.3–1.2)
Total Protein: 7.4 g/dL (ref 6.5–8.1)

## 2021-04-27 LAB — CBC WITH DIFFERENTIAL/PLATELET
Abs Immature Granulocytes: 0.01 10*3/uL (ref 0.00–0.07)
Basophils Absolute: 0.1 10*3/uL (ref 0.0–0.1)
Basophils Relative: 1 %
Eosinophils Absolute: 0.3 10*3/uL (ref 0.0–0.5)
Eosinophils Relative: 7 %
HCT: 30.9 % — ABNORMAL LOW (ref 36.0–46.0)
Hemoglobin: 10.1 g/dL — ABNORMAL LOW (ref 12.0–15.0)
Immature Granulocytes: 0 %
Lymphocytes Relative: 30 %
Lymphs Abs: 1.1 10*3/uL (ref 0.7–4.0)
MCH: 29.6 pg (ref 26.0–34.0)
MCHC: 32.7 g/dL (ref 30.0–36.0)
MCV: 90.6 fL (ref 80.0–100.0)
Monocytes Absolute: 0.2 10*3/uL (ref 0.1–1.0)
Monocytes Relative: 6 %
Neutro Abs: 2 10*3/uL (ref 1.7–7.7)
Neutrophils Relative %: 56 %
Platelets: 166 10*3/uL (ref 150–400)
RBC: 3.41 MIL/uL — ABNORMAL LOW (ref 3.87–5.11)
RDW: 13.8 % (ref 11.5–15.5)
WBC: 3.6 10*3/uL — ABNORMAL LOW (ref 4.0–10.5)
nRBC: 0 % (ref 0.0–0.2)

## 2021-04-27 LAB — URINE DRUG SCREEN, QUALITATIVE (ARMC ONLY)
Amphetamines, Ur Screen: NOT DETECTED
Barbiturates, Ur Screen: NOT DETECTED
Benzodiazepine, Ur Scrn: NOT DETECTED
Cannabinoid 50 Ng, Ur ~~LOC~~: NOT DETECTED
Cocaine Metabolite,Ur ~~LOC~~: NOT DETECTED
MDMA (Ecstasy)Ur Screen: NOT DETECTED
Methadone Scn, Ur: NOT DETECTED
Opiate, Ur Screen: NOT DETECTED
Phencyclidine (PCP) Ur S: NOT DETECTED
Tricyclic, Ur Screen: NOT DETECTED

## 2021-04-27 LAB — MAGNESIUM: Magnesium: 2.2 mg/dL (ref 1.7–2.4)

## 2021-04-27 LAB — RESP PANEL BY RT-PCR (FLU A&B, COVID) ARPGX2
Influenza A by PCR: NEGATIVE
Influenza B by PCR: NEGATIVE
SARS Coronavirus 2 by RT PCR: NEGATIVE

## 2021-04-27 LAB — CBG MONITORING, ED
Glucose-Capillary: 110 mg/dL — ABNORMAL HIGH (ref 70–99)
Glucose-Capillary: 128 mg/dL — ABNORMAL HIGH (ref 70–99)
Glucose-Capillary: 286 mg/dL — ABNORMAL HIGH (ref 70–99)
Glucose-Capillary: 191 mg/dL — ABNORMAL HIGH (ref 70–99)
Glucose-Capillary: 116 mg/dL — ABNORMAL HIGH (ref 70–99)
Glucose-Capillary: 138 mg/dL — ABNORMAL HIGH (ref 70–99)
Glucose-Capillary: 111 mg/dL — ABNORMAL HIGH (ref 70–99)
Glucose-Capillary: 80 mg/dL (ref 70–99)

## 2021-04-27 LAB — HCG, QUANTITATIVE, PREGNANCY: hCG, Beta Chain, Quant, S: 3 m[IU]/mL (ref <5)

## 2021-04-27 LAB — T4, FREE: Free T4: 0.65 ng/dL (ref 0.61–1.12)

## 2021-04-27 LAB — TSH: TSH: 4.991 u[IU]/mL — ABNORMAL HIGH (ref 0.350–4.500)

## 2021-04-27 LAB — LACTIC ACID, PLASMA: Lactic Acid, Venous: 0.9 mmol/L (ref 0.5–1.9)

## 2021-04-27 LAB — LIPASE, BLOOD: Lipase: 43 U/L (ref 11–51)

## 2021-04-27 MED ORDER — ACETAMINOPHEN 325 MG PO TABS
650.0000 mg | ORAL_TABLET | Freq: Four times a day (QID) | ORAL | Status: DC | PRN
  Administered 2021-04-28: 650 mg via ORAL
  Filled 2021-04-27: qty 2

## 2021-04-27 MED ORDER — SODIUM CHLORIDE 0.9 % IV SOLN: INTRAVENOUS | Status: DC

## 2021-04-27 MED ORDER — LACTATED RINGERS IV BOLUS: 1000.0000 mL | Freq: Once | INTRAVENOUS | Status: DC

## 2021-04-27 MED ORDER — ONDANSETRON HCL 4 MG/2ML IJ SOLN: 4.0000 mg | Freq: Four times a day (QID) | INTRAMUSCULAR | Status: DC | PRN

## 2021-04-27 MED ORDER — ACETAMINOPHEN 650 MG RE SUPP
650.0000 mg | Freq: Four times a day (QID) | RECTAL | Status: DC | PRN
  Filled 2021-04-27: qty 1

## 2021-04-27 MED ORDER — SODIUM CHLORIDE 0.9% FLUSH
3.0000 mL | Freq: Two times a day (BID) | INTRAVENOUS | Status: DC
  Administered 2021-04-27: 3 mL via INTRAVENOUS

## 2021-04-27 MED ORDER — ONDANSETRON HCL 4 MG PO TABS: 4.0000 mg | ORAL_TABLET | Freq: Four times a day (QID) | ORAL | Status: DC | PRN

## 2021-04-27 MED ORDER — BISACODYL 5 MG PO TBEC: 5.0000 mg | DELAYED_RELEASE_TABLET | Freq: Every day | ORAL | Status: DC | PRN

## 2021-04-27 MED ORDER — INSULIN ASPART 100 UNIT/ML IJ SOLN
0.0000 [IU] | INTRAMUSCULAR | Status: DC
Start: 2021-04-27 — End: 2021-04-28
  Administered 2021-04-28: 3 [IU] via SUBCUTANEOUS
  Administered 2021-04-28: 2 [IU] via SUBCUTANEOUS
  Filled 2021-04-27: qty 1

## 2021-04-27 MED ORDER — SODIUM CHLORIDE 0.9 % IV BOLUS
1000.0000 mL | Freq: Once | INTRAVENOUS | Status: AC
  Administered 2021-04-27: 1000 mL via INTRAVENOUS

## 2021-04-27 MED ORDER — MORPHINE SULFATE (PF) 2 MG/ML IV SOLN: 1.0000 mg | INTRAVENOUS | Status: DC | PRN

## 2021-04-27 MED ORDER — SODIUM ZIRCONIUM CYCLOSILICATE 5 G PO PACK
5.0000 g | PACK | Freq: Once | ORAL | Status: AC
Start: 2021-04-27 — End: 2021-04-27
  Administered 2021-04-27: 5 g via ORAL
  Filled 2021-04-27: qty 1

## 2021-04-27 MED ORDER — OXYCODONE HCL 5 MG PO TABS: 5.0000 mg | ORAL_TABLET | ORAL | Status: DC | PRN

## 2021-04-27 MED ORDER — ENOXAPARIN SODIUM 40 MG/0.4ML IJ SOSY
40.0000 mg | PREFILLED_SYRINGE | INTRAMUSCULAR | Status: DC
  Administered 2021-04-28: 40 mg via SUBCUTANEOUS

## 2021-04-27 NOTE — ED Notes (Signed)
CBG 191 

## 2021-04-27 NOTE — ED Notes (Signed)
Patient assisted to the restroom. Provided apple juice and graham crackers

## 2021-04-27 NOTE — ED Triage Notes (Signed)
Pt presents via [facility] from home when significant other called EMS due to decreased responsiveness. BG was 37 when fire department responded. EMS administered oral glucose and BG improved to 40 then administered glucagon en route. BG improved to 71. Pt unable to speak or verbalize name. Pt open eyes spontaneously, responds to voice and painful stimuli. Pt able to follow finger with eyes but unable to follow other commands. EMS vitals: BP 111/70, HR 84. Pt shook head when asked if know if has been exposed to anyone sick lately.

## 2021-04-27 NOTE — ED Notes (Signed)
Pt acutely began speaking and was responsive. Requested telephone to call boyfriend at home. Responded to commands fully and GCS updated. Pt denied any pain. Reported was feeling better overall. EDP notified by RN that pt alert and oriented x 4 now.

## 2021-04-27 NOTE — ED Notes (Signed)
Report received from Melissa, RN.

## 2021-04-27 NOTE — Progress Notes (Addendum)
Inpatient Diabetes Program Recommendations  AACE/ADA: New Consensus Statement on Inpatient Glycemic Control   Target Ranges:  Prepandial:   less than 140 mg/dL      Peak postprandial:   less than 180 mg/dL (1-2 hours)      Critically ill patients:  140 - 180 mg/dL    Latest Reference Range & Units 04/27/21 08:02 04/27/21 08:27 04/27/21 09:23 04/27/21 11:38  Glucose-Capillary 70 - 99 mg/dL 703 (H) 500 (H) 938 (H) 191 (H)    Latest Reference Range & Units 04/27/21 08:29  CO2 22 - 32 mmol/L 21 (L)  Glucose 70 - 99 mg/dL 182 (H)  Anion gap 5 - 15  6   Review of Glycemic Control Diabetes history: DM1 (makes NO insulin; requires basal, correction, and carb coverage insulin) Outpatient Diabetes medications: T-Slim insulin pump with Novolog, when not using pump taking Lantus 14 units daily, Novolog 0-6 units Q4H, Novolog 2 units TID with meals Current orders for Inpatient glycemic control: None  Inpatient Diabetes Program Recommendations:    Insulin: Please consider ordering CBGs Q4H with Novolog 0-6 units Q4H. If glucose becomes consistently over 200 mg/dl with SSI X9B, may want to consider ordering Semglee 5 units Q24H.  NOTE: Patient has DM1 and well known to inpatient DM team due to prolonged complex hospitalization from 12/13/20-03/04/21 (was pregnant with second child experienced hyperemesis gravidarum and malnutrition and sever hypoglycemia as well as starvation/DKA. Had c-section 02/17/21 and discharged 03/04/21). When patient was discharged on 03/04/21 she was prescribed Semglee 14 units QHS, Novolog 2 units TID with meals for meal coverage, Novolog 0-6 units Q4H for correction (had been on OmniPod prior to admission). Per notes in chart today, " EMS had spoken to patient's boyfriend, who stated that she was doing well last night but found her this morning unresponsive on the couch.  Patient found to have a blood sugar in the 30s and EMS was able to administer oral glucose with improvement  into the 110s.  Boyfriend had stated to EMS that patient usually takes off her insulin pump at night when she goes to sleep."  Initial glucose 110 mg/dl at 7:16 am today. Noted in Care Everywhere that patient seen Dr. Amalia Hailey [facility] Endocrinologist) on 04/02/21 and per note, plan was "Continue lantus 14 units daily and novolog using sliding scale. Patient is scheduled for a tandem insulin pump instructional appointment today though due to elevated blood sugars and forgetting pump at home this will be rescheduled."  Spoke with patient at bedside regarding glycemic control. Patient states that she started on T-Slim insulin pump with Dexcom recently and that her glucose had been running low on the pump but pump adjustments were made last week and her glucose was much better since then. Patient reports that her Dexcom CGM ran out and she had not replaced the CGM sensor so pump was not able to adjust insulin since she did not have on CGM. Patient states she removed her insulin pump on Saturday night and she states that she took Lantus 14 units on Sunday morning. Patient states she was using Novolog if she thought she needed it as she had not checked her glucose since CGM was not on and she could not find her glucometer to do finger stick glucose. Patient reports that she took Novolog 3 units Sunday morning and Novolog 2 units last night with supper (ate pizza). Patient states that she had been working with [employer] representative (pump rep) to set up pump and make adjustments. Patient  states she talked with [employer] rep Paula Compton last week and insulin pump settings were decreased further because she was still having lows. Patient does not have her insulin pump here at the hospital with her and she does not know what her total daily basal insulin was decreased to last week. Explained that if she took Lantus 14 units and had severe hypoglycemia, anticipate that her Lantus insulin dose would need to be decreased in case she uses Lantus  when her pump is not on. Patient states that she has pump supplies and one Dexcom CGM at home. She would like to resume her Dexcom and T-slim insulin pump once she gets discharged home. Patient does not know the number for the [employer] representative she was working with as she does not have her cell phone here at the hospital. Informed patient that I would call Dr. Darrol Jump office to see if I could find out what her insulin pump settings are currently (if [employer] representative provided them to Endocrinologist office). Explained that we would ask that SQ insulin be used while inpatient. Patient verbalized understanding of information and states she has no questions at this time. Called Dr. Darrol Jump office and had to leave a message on nurse line requesting return call to find out what insulin pump settings should be based on changes made last week.  Thanks, Orlando Penner, RN, MSN, CDE Diabetes Coordinator Inpatient Diabetes Program [phone] (Team Pager from 8am to 5pm)

## 2021-04-27 NOTE — H&P (Addendum)
History and Physical    Ashley Camacho WSF:[id number] DOB: [birth year] DOA: 04/27/2021  PCP: Hildred Laser, MD  Patient coming from: home     Chief Complaint: AMS, hypoglycemic  HPI: 30 y/o F w/ PMH of DM1 who presents w/ altered mental status and hypoglycemia. EMS found the pt to have BS in 30s and pt was given oral glucose and repeated BS was in 110s. Pt recently had a baby in Sept 2022 and had multiple complications, including hypoglycemic episodes and pt took steroids, BP meds for other complications. (pt did not know the dx she was given during pregnancy). Pt did not eat any food or take an insulin prior to coming to the hospital. However, pt did eat pizza for dinner last night and cereal for a snack prior to going to bed. Pt gave herself a "couple units of novolog" last night without checking her blood sugar and went to bed. Pt denies any fever, chills, sweating, chest pain, shortness of breath, nausea, vomiting, abd pain, dysuria, urinary urgency, urinary frequency or constipation. Of note, pt had 1 episode of diarrhea last night after eating cereal and pt is lactose intolerant.   Review of Systems: As per HPI otherwise 14 point review of systems negative.    Past Medical History:  Diagnosis Date   Anemia    Gastroparesis    Hypertension    Type 1 diabetes [facility])     Past Surgical History:  Procedure Laterality Date   BIOPSY  01/14/2021   Procedure: BIOPSY;  Surgeon: Tressia Danas, MD;  Location: [facility] ENDOSCOPY;  Service: Gastroenterology;;   CESAREAN SECTION     CESAREAN SECTION WITH BILATERAL TUBAL LIGATION N/A 02/17/2021   Procedure: CESAREAN SECTION WITH BILATERAL TUBAL LIGATION;  Surgeon: [name], DO;  Location: [facility] LD ORS;  Service: Obstetrics;  Laterality: N/A;   ESOPHAGOGASTRODUODENOSCOPY (EGD) WITH PROPOFOL N/A 01/14/2021   Procedure: ESOPHAGOGASTRODUODENOSCOPY (EGD) WITH PROPOFOL;  Surgeon: Tressia Danas, MD;  Location: [facility] ENDOSCOPY;  Service:  Gastroenterology;  Laterality: N/A;   MOUTH SURGERY       reports that she has never smoked. She has never used smokeless tobacco. She reports that she does not drink alcohol and does not use drugs.  No Known Allergies  Family History  Problem Relation Age of Onset   Diabetes type I Father    CAD Father    CAD Paternal Grandmother    CAD Paternal Grandfather    Breast cancer Mother    Ovarian cancer Neg Hx      Prior to Admission medications   Medication Sig Start Date End Date Taking? Authorizing Provider  acetaminophen (TYLENOL) 500 MG tablet Take 2 tablets (1,000 mg total) by mouth every 6 (six) hours as needed for headache, fever or moderate pain. 03/04/21   Constant, Peggy, MD  aluminum-petrolatum-zinc (1-2-3 PASTE) 0.027-13.7-12.5% PSTE paste Apply 1 application topically 3 (three) times daily. 12/16/20   Darlin Priestly, MD  amLODipine (NORVASC) 5 MG tablet Take 1 tablet (5 mg total) by mouth daily. 03/05/21   Constant, Peggy, MD  bisacodyl (DULCOLAX) 10 MG suppository Place 1 suppository (10 mg total) rectally daily as needed for moderate constipation. 03/04/21   Constant, Peggy, MD  enoxaparin (LOVENOX) 40 MG/0.4ML injection Inject 0.4 mLs (40 mg total) into the skin daily. 03/04/21   Constant, Peggy, MD  famotidine (PEPCID) 20 MG tablet Take 1 tablet (20 mg total) by mouth 2 (two) times daily. 03/04/21   Constant, Peggy, MD  furosemide (LASIX) 40 MG  tablet Take 1 tablet (40 mg total) by mouth daily. 03/04/21   Constant, Peggy, MD  hydrocortisone (CORTEF) 10 MG tablet Take 1 tablet (10 mg total) by mouth daily with breakfast. 03/04/21   Constant, Peggy, MD  hydrocortisone (CORTEF) 5 MG tablet Take 1 tablet (5 mg total) by mouth daily with supper. 03/04/21   Constant, Peggy, MD  insulin aspart (NOVOLOG) 100 UNIT/ML injection Inject 0-6 Units into the skin every 4 (four) hours. 03/04/21   Constant, Peggy, MD  insulin aspart (NOVOLOG) 100 UNIT/ML FlexPen Inject 2 Units into the skin 3  (three) times daily with meals Inject 0-6 Units into the skin every 4 (four) hours. 03/04/21   Constant, Peggy, MD  insulin glargine (LANTUS) 100 UNIT/ML Solostar Pen Inject 14 Units into the skin daily. 03/05/21   Constant, Peggy, MD  metoCLOPramide (REGLAN) 5 MG tablet Take 1 tablet (5 mg total) by mouth 3 (three) times daily with meals as needed for nausea or vomiting. 03/04/21   Constant, Peggy, MD  oxyCODONE (OXY IR/ROXICODONE) 5 MG immediate release tablet Take 1 tablet (5 mg total) by mouth every 4 (four) hours as needed for moderate pain. 03/04/21   Constant, Peggy, MD  Prenatal Vit-Fe Fumarate-FA (PRENATAL MULTIVITAMIN) TABS tablet Take 1 tablet by mouth daily at 12 noon.    [provider]    Physical Exam: Vitals:   04/27/21 0806 04/27/21 0928 04/27/21 1015  BP: 123/68 118/78 110/64  Pulse: 67 65 66  Resp:  12 12  Temp: (!) 91.8 F (33.2 C) (!) 94 F (34.4 C)   TempSrc: Rectal Axillary   SpO2: 98% 100% 100%    Constitutional: NAD, calm, comfortable Vitals:   04/27/21 0806 04/27/21 0928 04/27/21 1015  BP: 123/68 118/78 110/64  Pulse: 67 65 66  Resp:  12 12  Temp: (!) 91.8 F (33.2 C) (!) 94 F (34.4 C)   TempSrc: Rectal Axillary   SpO2: 98% 100% 100%   Eyes: PERRL, lids and conjunctivae normal ENMT: Mucous membranes are moist. Normal dentition.  Neck: normal, supple Respiratory: clear to auscultation bilaterally, no wheezing, no crackles. Normal respiratory effort. No accessory muscle use.  Cardiovascular: S1/S2+, no rubs/ gallops. No extremity edema. Abdomen: soft, no tenderness, non-distended, Bowel sounds positive.  Musculoskeletal: no clubbing / cyanosis. No joint deformity upper and lower extremities. Good ROM, no contractures. Normal muscle tone.  Skin: no rashes, lesions Neurologic: CN 2-12 grossly intact. Moves all extremities  Psychiatric: Normal judgment and insight. Alert and oriented x 3. Normal mood.     Labs on Admission: I have personally  reviewed following labs and imaging studies  CBC: Recent Labs  Lab 04/27/21 0829  WBC 3.6*  NEUTROABS 2.0  HGB 10.1*  HCT 30.9*  MCV 90.6  PLT 166   Basic Metabolic Panel: Recent Labs  Lab 04/27/21 0829  NA 138  K 5.8*  CL 111  CO2 21*  GLUCOSE 121*  BUN 33*  CREATININE 1.34*  CALCIUM 9.0  MG 2.2   GFR: CrCl cannot be calculated (Unknown ideal weight.). Liver Function Tests: Recent Labs  Lab 04/27/21 0829  AST 26  ALT 10  ALKPHOS 62  BILITOT 0.7  PROT 7.4  ALBUMIN 4.0   Recent Labs  Lab 04/27/21 0829  LIPASE 43   No results for input(s): AMMONIA in the last 168 hours. Coagulation Profile: No results for input(s): INR, PROTIME in the last 168 hours. Cardiac Enzymes: No results for input(s): CKTOTAL, CKMB, CKMBINDEX, TROPONINI in the last  168 hours. BNP (last 3 results) No results for input(s): PROBNP in the last 8760 hours. HbA1C: No results for input(s): HGBA1C in the last 72 hours. CBG: Recent Labs  Lab 04/27/21 0802 04/27/21 0827 04/27/21 0923  GLUCAP 110* 128* 286*   Lipid Profile: No results for input(s): CHOL, HDL, LDLCALC, TRIG, CHOLHDL, LDLDIRECT in the last 72 hours. Thyroid Function Tests: Recent Labs    04/27/21 0829  TSH 4.991*  FREET4 0.65   Anemia Panel: No results for input(s): VITAMINB12, FOLATE, FERRITIN, TIBC, IRON, RETICCTPCT in the last 72 hours. Urine analysis:    Component Value Date/Time   COLORURINE AMBER (A) 01/10/2021 0131   APPEARANCEUR CLOUDY (A) 01/10/2021 0131   APPEARANCEUR Clear 09/18/2020 1153   LABSPEC 1.017 01/10/2021 0131   LABSPEC 1.026 03/01/2013 1844   PHURINE 5.0 01/10/2021 0131   GLUCOSEU NEGATIVE 01/10/2021 0131   GLUCOSEU >=500 03/01/2013 1844   HGBUR NEGATIVE 01/10/2021 0131   BILIRUBINUR NEGATIVE 01/10/2021 0131   BILIRUBINUR neg 10/23/2020 0953   BILIRUBINUR Negative 09/18/2020 1153   BILIRUBINUR Negative 03/01/2013 1844   KETONESUR NEGATIVE 01/10/2021 0131   PROTEINUR NEGATIVE  01/10/2021 0131   UROBILINOGEN 0.2 10/23/2020 0953   NITRITE NEGATIVE 01/10/2021 0131   LEUKOCYTESUR SMALL (A) 01/10/2021 0131   LEUKOCYTESUR Negative 03/01/2013 1844    Radiological Exams on Admission: CT Head Wo Contrast  Result Date: 04/27/2021 CLINICAL DATA:  Decreased responsiveness.  Hypoglycemia. EXAM: CT HEAD WITHOUT CONTRAST TECHNIQUE: Contiguous axial images were obtained from the base of the skull through the vertex without intravenous contrast. COMPARISON:  None. FINDINGS: Brain: The brain shows a normal appearance without evidence of malformation, atrophy, old or acute small or large vessel infarction, mass lesion, hemorrhage, hydrocephalus or extra-axial collection. Vascular: No hyperdense vessel. No evidence of atherosclerotic calcification. Skull: Normal.  No traumatic finding.  No focal bone lesion. Sinuses/Orbits: Sinuses are clear. Orbits appear normal. Mastoids are clear. Other: None significant IMPRESSION: Normal head CT. Electronically Signed   By: Paulina Fusi M.D.   On: 04/27/2021 08:42   DG Chest Portable 1 View  Result Date: 04/27/2021 CLINICAL DATA:  Weakness EXAM: PORTABLE CHEST 1 VIEW COMPARISON:  02/21/2021 FINDINGS: Previously seen PICC line has been removed. The heart size and mediastinal contours are within normal limits. Both lungs are clear. The visualized skeletal structures are unremarkable. IMPRESSION: No active disease. Electronically Signed   By: Duanne Guess D.O.   On: 04/27/2021 08:53    EKG: Independently reviewed.   Assessment/Plan Principal Problem:   Hypoglycemia  Acute metabolic encephalopathy: likely secondary to hypoglycemia. Hx of DM1. Last BS was 286. Mental status is back to baseline   DM1: pretty well controlled, last HbA1c 6.9 & w/ hypoglycemic episodes. Continue on accuchecks ACHS. DM coordinator consulted  Postpartum: delivered a baby in Sept 2022. Had multiple complications during pregnancy for which she took steroids, BP meds &  GERD meds and no longer takes as per pt. Pt is not sure of the dx she was given during pregnancy   Normocytic anemia: H&H are stable. No need for a transfusion currently   AKI: continue on IVFs. Avoid nephrotoxic meds  Hyperkalemia: lokelma x 1. Repeat potassium level ordered     DVT prophylaxis: lovenox Code Status: full  Family Communication:  Disposition Plan: likely d/c back home  Consults called:  none  Admission status: observation, med-surg    Charise Killian MD [facility]   If 7PM-7AM, please contact night-coverage   04/27/2021, 11:14 AM

## 2021-04-27 NOTE — ED Provider Notes (Signed)
[facility] Emergency Department Provider Note   ____________________________________________   Event Date/Time   First MD Initiated Contact with Patient 04/27/21 [id number]     (approximate)  I have reviewed the triage vital signs and the nursing notes.   HISTORY  Chief Complaint Hypoglycemia    HPI Ashley Camacho is a 30 y.o. female with past medical history of type 1 diabetes, cardiomyopathy, systolic CHF, upper extremity DVT, gastroparesis, adrenal insufficiency, and hypothyroidism who presents to the ED for altered mental status.  History is limited as patient arrives unable to provide any verbal response, is able to nod her head yes and no to answer questions.  EMS had spoken to patient's boyfriend, who stated that she was doing well last night but found her this morning unresponsive on the couch.  Patient found to have a blood sugar in the 30s and EMS was able to administer oral glucose with improvement into the 110s.  Boyfriend had stated to EMS that patient usually takes off her insulin pump at night when she goes to sleep.  Boyfriend was unable to state the last time that patient had anything to eat or drink.         Past Medical History:  Diagnosis Date   Anemia    Gastroparesis    Hypertension    Type 1 diabetes [facility])     Patient Active Problem List   Diagnosis Date Noted   Postoperative hematoma involving genitourinary system following genitourinary procedure    S/P cesarean section 02/17/2021   Acute esophagitis    Leg edema    History of preterm delivery, currently pregnant    Pre-existing type 1 diabetes mellitus during pregnancy in third trimester    Anemia during pregnancy    Type 1 diabetes mellitus affecting pregnancy in second trimester, antepartum    HFrEF (heart failure with reduced ejection fraction) (HCC)    Acute deep vein thrombosis (DVT) of brachial vein of right upper extremity (HCC) 12/17/2020   Anxiety 12/17/2020    Diabetic retinopathy (HCC) 12/17/2020   Acute on chronic combined systolic and diastolic CHF (congestive heart failure) (HCC)    Hyperemesis 12/13/2020   DKA, type 1, not at goal [facility]) 12/13/2020   Sunburn 12/13/2020   Hypothyroidism 12/10/2020   Malnutrition (HCC) 12/10/2020   Vitreous hemorrhage of right eye (HCC) 12/10/2020   Pleural effusion associated with pulmonary infection 11/25/2020   Pneumonia affecting pregnancy 11/24/2020   Diabetes mellitus affecting pregnancy, second trimester 11/24/2020   Edema 11/24/2020   Decreased urine output 11/24/2020   Shortness of breath    Zinc deficiency    SOB (shortness of breath)    Social problem 11/21/2020   Nausea and vomiting during pregnancy prior to [redacted] weeks gestation 11/21/2020   Low serum vitamin B12    Delivery with history of C-section 11/20/2020   Absolute anemia    Acute kidney injury (HCC)    Nausea and vomiting during pregnancy 10/23/2020   History of premature delivery, currently pregnant, second trimester 10/13/2020   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   Depressive disorder 09/28/2018   Neuropathy 09/28/2018   Gastroparesis due to DM (HCC) 04/22/2016   Diabetic sensorimotor neuropathy (HCC) 04/19/2016   DKA (diabetic ketoacidoses) 04/03/2016   Microalbuminuria 09/26/2005    Past Surgical History:  Procedure Laterality Date   BIOPSY  01/14/2021   Procedure: BIOPSY;  Surgeon: Tressia Danas, MD;  Location: [facility] ENDOSCOPY;  Service: Gastroenterology;;   CESAREAN SECTION     CESAREAN  SECTION WITH BILATERAL TUBAL LIGATION N/A 02/17/2021   Procedure: CESAREAN SECTION WITH BILATERAL TUBAL LIGATION;  Surgeon: [name], DO;  Location: [facility] LD ORS;  Service: Obstetrics;  Laterality: N/A;   ESOPHAGOGASTRODUODENOSCOPY (EGD) WITH PROPOFOL N/A 01/14/2021   Procedure: ESOPHAGOGASTRODUODENOSCOPY (EGD) WITH PROPOFOL;  Surgeon: Tressia Danas, MD;  Location: [facility] ENDOSCOPY;  Service: Gastroenterology;  Laterality: N/A;   MOUTH  SURGERY      Prior to Admission medications   Medication Sig Start Date End Date Taking? Authorizing Provider  acetaminophen (TYLENOL) 500 MG tablet Take 2 tablets (1,000 mg total) by mouth every 6 (six) hours as needed for headache, fever or moderate pain. 03/04/21   Constant, Peggy, MD  aluminum-petrolatum-zinc (1-2-3 PASTE) 0.027-13.7-12.5% PSTE paste Apply 1 application topically 3 (three) times daily. 12/16/20   Darlin Priestly, MD  amLODipine (NORVASC) 5 MG tablet Take 1 tablet (5 mg total) by mouth daily. 03/05/21   Constant, Peggy, MD  bisacodyl (DULCOLAX) 10 MG suppository Place 1 suppository (10 mg total) rectally daily as needed for moderate constipation. 03/04/21   Constant, Peggy, MD  enoxaparin (LOVENOX) 40 MG/0.4ML injection Inject 0.4 mLs (40 mg total) into the skin daily. 03/04/21   Constant, Peggy, MD  famotidine (PEPCID) 20 MG tablet Take 1 tablet (20 mg total) by mouth 2 (two) times daily. 03/04/21   Constant, Peggy, MD  furosemide (LASIX) 40 MG tablet Take 1 tablet (40 mg total) by mouth daily. 03/04/21   Constant, Peggy, MD  hydrocortisone (CORTEF) 10 MG tablet Take 1 tablet (10 mg total) by mouth daily with breakfast. 03/04/21   Constant, Peggy, MD  hydrocortisone (CORTEF) 5 MG tablet Take 1 tablet (5 mg total) by mouth daily with supper. 03/04/21   Constant, Peggy, MD  insulin aspart (NOVOLOG) 100 UNIT/ML injection Inject 0-6 Units into the skin every 4 (four) hours. 03/04/21   Constant, Peggy, MD  insulin aspart (NOVOLOG) 100 UNIT/ML FlexPen Inject 2 Units into the skin 3 (three) times daily with meals Inject 0-6 Units into the skin every 4 (four) hours. 03/04/21   Constant, Peggy, MD  insulin glargine (LANTUS) 100 UNIT/ML Solostar Pen Inject 14 Units into the skin daily. 03/05/21   Constant, Peggy, MD  metoCLOPramide (REGLAN) 5 MG tablet Take 1 tablet (5 mg total) by mouth 3 (three) times daily with meals as needed for nausea or vomiting. 03/04/21   Constant, Peggy, MD  oxyCODONE  (OXY IR/ROXICODONE) 5 MG immediate release tablet Take 1 tablet (5 mg total) by mouth every 4 (four) hours as needed for moderate pain. 03/04/21   Constant, Peggy, MD  Prenatal Vit-Fe Fumarate-FA (PRENATAL MULTIVITAMIN) TABS tablet Take 1 tablet by mouth daily at 12 noon.    [provider]    Allergies Patient has no known allergies.  Family History  Problem Relation Age of Onset   Diabetes type I Father    CAD Father    CAD Paternal Grandmother    CAD Paternal Grandfather    Breast cancer Mother    Ovarian cancer Neg Hx     Social History Social History   Tobacco Use   Smoking status: Never   Smokeless tobacco: Never  Vaping Use   Vaping Use: Never used  Substance Use Topics   Alcohol use: No   Drug use: No    Review of Systems Unable to obtain secondary to altered mental status  ____________________________________________   PHYSICAL EXAM:  VITAL SIGNS: ED Triage Vitals  Enc Vitals Group  BP      Pulse      Resp      Temp      Temp src      SpO2      Weight      Height      Head Circumference      Peak Flow      Pain Score      Pain Loc      Pain Edu?      Excl. in GC?     Constitutional: Awake and alert, slow to respond and communicates with head nodding. Eyes: Conjunctivae are normal.  Pupils equal, round, and reactive to light bilaterally. Head: Atraumatic. Nose: No congestion/rhinnorhea. Mouth/Throat: Mucous membranes are moist. Neck: Normal ROM Cardiovascular: Normal rate, regular rhythm. Grossly normal heart sounds.  2+ radial pulses bilaterally. Respiratory: Normal respiratory effort.  No retractions. Lungs CTAB. Gastrointestinal: Soft and nontender. No distention. Genitourinary: deferred Musculoskeletal: No lower extremity tenderness nor edema. Neurologic:  Normal speech and language. No gross focal neurologic deficits are appreciated. Skin:  Skin is cool to touch, dry and intact.  No rashes noted. Psychiatric: Unable to  assess.  ____________________________________________   LABS (all labs ordered are listed, but only abnormal results are displayed)  Labs Reviewed  CBC WITH DIFFERENTIAL/PLATELET - Abnormal; Notable for the following components:      Result Value   WBC 3.6 (*)    RBC 3.41 (*)    Hemoglobin 10.1 (*)    HCT 30.9 (*)    All other components within normal limits  COMPREHENSIVE METABOLIC PANEL - Abnormal; Notable for the following components:   Potassium 5.8 (*)    CO2 21 (*)    Glucose, Bld 121 (*)    BUN 33 (*)    Creatinine, Ser 1.34 (*)    GFR, Estimated 55 (*)    All other components within normal limits  TSH - Abnormal; Notable for the following components:   TSH 4.991 (*)    All other components within normal limits  CBG MONITORING, ED - Abnormal; Notable for the following components:   Glucose-Capillary 110 (*)    All other components within normal limits  CBG MONITORING, ED - Abnormal; Notable for the following components:   Glucose-Capillary 128 (*)    All other components within normal limits  CBG MONITORING, ED - Abnormal; Notable for the following components:   Glucose-Capillary 286 (*)    All other components within normal limits  RESP PANEL BY RT-PCR (FLU A&B, COVID) ARPGX2  CULTURE, BLOOD (ROUTINE X 2)  CULTURE, BLOOD (ROUTINE X 2)  LIPASE, BLOOD  MAGNESIUM  T4, FREE  URINALYSIS, ROUTINE W REFLEX MICROSCOPIC  HCG, QUANTITATIVE, PREGNANCY  URINE DRUG SCREEN, QUALITATIVE ([facility] ONLY)  LACTIC ACID, PLASMA  LACTIC ACID, PLASMA  ETHANOL   ____________________________________________  EKG  ED ECG REPORT I, Chesley Noon, the attending physician, personally viewed and interpreted this ECG.   Date: 04/27/2021  EKG Time: 8:06  Rate: 68  Rhythm: normal sinus rhythm  Axis: Normal  Intervals: Prolonged QT  ST&T Change: None    PROCEDURES  Procedure(s) performed (including Critical Care):  .Critical Care Performed by: Chesley Noon, MD Authorized  by: Chesley Noon, MD   Critical care provider statement:    Critical care time (minutes):  45   Critical care time was exclusive of:  Separately billable procedures and treating other patients and teaching time   Critical care was necessary to treat or prevent imminent or life-threatening deterioration  of the following conditions:  Endocrine crisis and metabolic crisis   Critical care was time spent personally by me on the following activities:  Development of treatment plan with patient or surrogate, discussions with consultants, evaluation of patient's response to treatment, examination of patient, ordering and review of laboratory studies, ordering and review of radiographic studies, ordering and performing treatments and interventions, pulse oximetry, re-evaluation of patient's condition and review of old charts   I assumed direction of critical care for this patient from another provider in my specialty: no     Care discussed with: admitting provider     ____________________________________________   INITIAL IMPRESSION / ASSESSMENT AND PLAN / ED COURSE      30 year old female with past medical history of type 1 diabetes, cardiomyopathy, systolic CHF, upper extremity DVT, gastroparesis, adrenal insufficiency, and hypothyroidism who presents to the ED for altered mental status after being found with a blood sugar in the 30s at home.  Blood glucose has now improved after patient was given oral glucose by EMS, she is awake and alert, tracking with eyes but only able to communicate via head nodding.  She has a long history of poor nutrition due to food insecurity, also has a long history of brittle diabetes with poor compliance.  Patient noted to be markedly hypothermic but with stable vital signs, we will start sepsis work-up but hold off on antibiotics given no clear source of infection.  Given she has not returned to her baseline mental status with normal blood sugar, we will check CT head,  no focal neurologic deficits noted on exam.  We will assess for electrolyte abnormality, hypothyroidism, and infectious process to explain her presentation today.  Notably, patient was seen by [facility] endocrinology about 1 month ago and found to have a potassium of greater than 6, instructed to go to the ED at that time but refused.  No EKG changes today to suggest hyperkalemia, however prolonged QT is concerning for hypokalemia or hypomagnesemia.  Labs remarkable for mild hyperkalemia but no other electrolyte abnormality noted and renal function is stable compared to previous.  We will give dose of Lokelma but hyperkalemia may be related to hemolysis.  Patient is now more alert and answering questions appropriately, temperature gradually improving.  Chest x-ray reviewed by me and shows no infiltrate, edema, or effusion.  UA is pending but no obvious source of infection at this time and we will hold off on antibiotics.  CT head is negative for acute process.  Prolonged QT is likely related to patient's hypothermia.  Case discussed with hospitalist for admission.      ____________________________________________   FINAL CLINICAL IMPRESSION(S) / ED DIAGNOSES  Final diagnoses:  Hypoglycemia  Hypothermia, initial encounter     ED Discharge Orders     None        Note:  This document was prepared using Dragon voice recognition software and may include unintentional dictation errors.    Chesley Noon, MD 04/27/21 1114

## 2021-04-27 NOTE — ED Notes (Signed)
Due to low temperature pt placed on bairhugger

## 2021-04-27 NOTE — ED Notes (Signed)
Pt ate majority of lunch provided at bedside

## 2021-04-27 NOTE — ED Notes (Signed)
Lab notified for blood collection since was not able to collect enough blood during original IV collection and reattempt at approx 1130 and 1215 when available was unsuccessful.

## 2021-04-27 NOTE — ED Notes (Signed)
Pt ate all dinner provided at bedside

## 2021-04-28 ENCOUNTER — Other Ambulatory Visit: Payer: Self-pay

## 2021-04-28 ENCOUNTER — Observation Stay: Payer: Medicaid Other

## 2021-04-28 DIAGNOSIS — M79621 Pain in right upper arm: Secondary | ICD-10-CM | POA: Diagnosis not present

## 2021-04-28 DIAGNOSIS — E1010 Type 1 diabetes mellitus with ketoacidosis without coma: Secondary | ICD-10-CM | POA: Diagnosis not present

## 2021-04-28 DIAGNOSIS — E162 Hypoglycemia, unspecified: Secondary | ICD-10-CM | POA: Diagnosis not present

## 2021-04-28 LAB — LACTIC ACID, PLASMA: Lactic Acid, Venous: 0.8 mmol/L (ref 0.5–1.9)

## 2021-04-28 LAB — BASIC METABOLIC PANEL
Anion gap: 3 — ABNORMAL LOW (ref 5–15)
BUN: 30 mg/dL — ABNORMAL HIGH (ref 6–20)
CO2: 20 mmol/L — ABNORMAL LOW (ref 22–32)
Calcium: 8.1 mg/dL — ABNORMAL LOW (ref 8.9–10.3)
Chloride: 112 mmol/L — ABNORMAL HIGH (ref 98–111)
Creatinine, Ser: 1.16 mg/dL — ABNORMAL HIGH (ref 0.44–1.00)
GFR, Estimated: >60 mL/min (ref >60)
Glucose, Bld: 218 mg/dL — ABNORMAL HIGH (ref 70–99)
Potassium: 4.9 mmol/L (ref 3.5–5.1)
Sodium: 135 mmol/L (ref 135–145)

## 2021-04-28 LAB — HEMOGLOBIN A1C
Hgb A1c MFr Bld: 6.6 % — ABNORMAL HIGH (ref 4.8–5.6)
Mean Plasma Glucose: 143 mg/dL

## 2021-04-28 LAB — MAGNESIUM: Magnesium: 2 mg/dL (ref 1.7–2.4)

## 2021-04-28 LAB — CBC
HCT: 28.2 % — ABNORMAL LOW (ref 36.0–46.0)
Hemoglobin: 9.2 g/dL — ABNORMAL LOW (ref 12.0–15.0)
MCH: 29.3 pg (ref 26.0–34.0)
MCHC: 32.6 g/dL (ref 30.0–36.0)
MCV: 89.8 fL (ref 80.0–100.0)
Platelets: 155 10*3/uL (ref 150–400)
RBC: 3.14 MIL/uL — ABNORMAL LOW (ref 3.87–5.11)
RDW: 13.5 % (ref 11.5–15.5)
WBC: 4.1 10*3/uL (ref 4.0–10.5)
nRBC: 0 % (ref 0.0–0.2)

## 2021-04-28 LAB — GLUCOSE, CAPILLARY
Glucose-Capillary: 204 mg/dL — ABNORMAL HIGH (ref 70–99)
Glucose-Capillary: 106 mg/dL — ABNORMAL HIGH (ref 70–99)
Glucose-Capillary: 51 mg/dL — ABNORMAL LOW (ref 70–99)
Glucose-Capillary: 66 mg/dL — ABNORMAL LOW (ref 70–99)
Glucose-Capillary: 269 mg/dL — ABNORMAL HIGH (ref 70–99)

## 2021-04-28 MED ORDER — INSULIN GLARGINE-YFGN 100 UNIT/ML ~~LOC~~ SOLN
6.0000 [IU] | Freq: Every day | SUBCUTANEOUS | Status: DC
  Filled 2021-04-28: qty 0.06

## 2021-04-28 NOTE — Progress Notes (Signed)
During shift report patient found with CBG of 51. 8oz of Orange juice provided per protocol. CBG rechecked ~15 min later and CBG 66. Orange juice provided as well as graham crackers. Patient reports no symptoms. Will recheck in 15 min

## 2021-04-28 NOTE — Progress Notes (Addendum)
Inpatient Diabetes Program Recommendations  AACE/ADA: New Consensus Statement on Inpatient Glycemic Control  Target Ranges:  Prepandial:   less than 140 mg/dL      Peak postprandial:   less than 180 mg/dL (1-2 hours)      Critically ill patients:  140 - 180 mg/dL    Latest Reference Range & Units 04/27/21 09:23 04/27/21 11:38 04/27/21 14:41 04/27/21 17:41 04/27/21 21:00 04/27/21 23:06 04/28/21 04:24 04/28/21 07:37  Glucose-Capillary 70 - 99 mg/dL 409 (H) 811 (H) 914 (H) 138 (H) 111 (H) 80 204 (H)  Novolog 2 units@5 :02 51 (L)    Review of Glycemic Control  Diabetes history: DM1 (makes NO insulin; requires basal, correction, and carb coverage insulin) Outpatient Diabetes medications: T-Slim insulin pump with Novolog, when not using pump taking Lantus 14 units daily, Novolog 0-6 units Q4H, Novolog 2 units TID with meals Current orders for Inpatient glycemic control: Semglee 6 units daily, Novolog 0-6 units Q4H  Inpatient Diabetes Program Recommendations:    Insulin: No insulin given on 04/27/21. Glucose 204 mg/dl at 7:82 am today and patient was given Novolog 2 units at 5:02 am. Following glucose 51 mg/dl at 9:56 am today. Noted Semglee 6 units daily ordered today. If patient will be discharged today, may want to consider discontinuing Semglee since patient plans to resume her insulin pump as soon as she gets home.  Insulin Pump: Patient would like to resume her insulin pump once she is discharged. Please instruct patient to contact her Endocrinologist to see if she needs additional adjustments with insulin pump settings.   NOTE: Spoke with patient on 04/27/21 and patient reported that she started on T-Slim insulin pump with Dexcom recently and that her glucose had been running low on the pump but pump adjustments were made last week and her glucose was much better since then. Patient reports that her Dexcom CGM ran out and she had not replaced the CGM sensor so pump was not able to adjust  insulin since she did not have on CGM. Patient states she removed her insulin pump on Saturday night and she states that she took Lantus 14 units on Sunday morning. Patient states she talked with [employer] rep Paula Compton last week and insulin pump settings were decreased further because she was still having lows. . Patient states that she has pump supplies and one Dexcom CGM at home. She would like to resume her Dexcom and T-slim insulin pump once she gets discharged home. Received message from Endocrinology office called yesterday requesting that I call another office for Dr. Amalia Hailey [phone]) to discuss patient. Called this morning at 8:00 am and left message on the nurse line requesting a return call.  Addendum 04/28/21@8 :49-Spoke with patient over the phone. Discussed plan to be discharged today. Patient states that Dr. Chipper Herb has already came in and talked with her and told her that she would be discharged this morning. Discussed that she was last given Novolog 2 units at 5:02 am and no basal insulin has been given since she arrived at the hospital. Stressed that she will need to get her insulin pump restarted as soon as she gets home this morning as well as her Dexcom CGM.  Stressed that pump will need to be resumed to prevent her from developing DKA.  Patient confirms that she has pump supplies and a Dexcom CGM sensor that she will get started as soon as she gets home today. Discussed with patient that she is very sensitive to insulin and explained that when  she received the Novolog 2 units at 5:02 am today that it dropped her glucose 153 mg/dl (so 1 unit drops glucose about 76 mg/dl). Encouraged patient to call Dr. Darrol Jump office to go ahead and as for refill for Dexcom sensors (only has 1 sensor at home) and pump supplies (if needed). Informed patient that I had left a message with Dr. Darrol Jump office and that if they called back after she is discharged then I would ask them to call her directly. Encouraged patient to  contact Dr. Darrol Jump office immediately if she has any further glucose values less than 70 mg/dl so they can assist her with insulin pump setting adjustments. Patient verbalized understanding of information and states she has no questions at this time.  Addendum 04/28/21@14 :24-Received return call from [facility] with Dr. Darrol Jump office. Informed Brooke of severe hypoglycemia that brought patient in the the hospital this admission and patient plan to resume Dexcom CGM and T-slim insulin pump as soon as she got home today. Informed Nehemiah Settle that patient had already been discharged so Brooke plans to call patient directly. Provided with patient's current insulin pump settings Basal 12A 0.2 units/hour 6A   0.3 units/hour Total Basal: 6.6 units per 24 hours Correction Factor 1:80 mg/dl (1 unit drops glucose 80 mg/dl) Carb Ratio 2:75 grams (1 unit covers 20 grams of carbs) Target glucose 110 mg/dl Brooke provided her office number [phone]) and cell number [phone]) in case patient is inpatient in the future and providers or diabetes coordinator team needs to reach out regarding patient.   Thanks, Orlando Penner, RN, MSN, CDE Diabetes Coordinator Inpatient Diabetes Program [phone] (Team Pager from 8am to 5pm)

## 2021-04-28 NOTE — Discharge Summary (Signed)
Physician Discharge Summary  Patient ID: Ashley Camacho MRN: [id number] DOB/AGE: [birth year] 30 y.o.  Admit date: 04/27/2021 Discharge date: 04/28/2021  Admission Diagnoses:  Discharge Diagnoses:  Principal Problem:   Hypoglycemia   Discharged Condition: good  Hospital Course:   30 y/o F w/ PMH of DM1 who presents w/ altered mental status and hypoglycemia. EMS found the pt to have BS in 30 and pt was given oral glucose and repeated BS was in 110s. Patient has brittle diabetes type 1, recently she was not able to use her insulin pump due to lack insulin, she was placed on 12 units of her Lantus instead.  Patient never had hypoglycemia while on insulin pump. After discussed with patient and the diabetic coordinator, patient is able to set up insulin pump today.  This point, she is advised to discontinue Lantus, and restart insulin pump. Patient also complaining of a right arm pain, she had a history of DVT.  Duplex ultrasound did not show DVT at this time.  At this point she is medically stable to be discharged.    Consults: None  Significant Diagnostic Studies: None  Treatments: D50  Discharge Exam: Blood pressure (!) 93/54, pulse 82, temperature 98 F (36.7 C), temperature source Oral, resp. rate 20, height 5\' 1"  (1.549 m), weight 49.7 kg, SpO2 100 %, unknown if currently breastfeeding. General appearance: alert and cooperative Resp: clear to auscultation bilaterally Cardio: regular rate and rhythm, S1, S2 normal, no murmur, click, rub or gallop GI: soft, non-tender; bowel sounds normal; no masses,  no organomegaly Extremities: extremities normal, atraumatic, no cyanosis or edema  Disposition: Discharge disposition: 01-Home or Self Care       Discharge Instructions     Diet - low sodium heart healthy   Complete by: As directed    Increase activity slowly   Complete by: As directed       Allergies as of 04/28/2021   No Known Allergies      Medication List      STOP taking these medications    aluminum-petrolatum-zinc 0.027-13.7-12.5% Pste paste Commonly known as: 1-2-3 PASTE   amLODipine 5 MG tablet Commonly known as: NORVASC   bisacodyl 10 MG suppository Commonly known as: DULCOLAX   enoxaparin 40 MG/0.4ML injection Commonly known as: LOVENOX   famotidine 20 MG tablet Commonly known as: PEPCID   furosemide 40 MG tablet Commonly known as: LASIX   hydrocortisone 10 MG tablet Commonly known as: CORTEF   hydrocortisone 5 MG tablet Commonly known as: CORTEF   Lantus SoloStar 100 UNIT/ML Solostar Pen Generic drug: insulin glargine   metoCLOPramide 5 MG tablet Commonly known as: REGLAN   oxyCODONE 5 MG immediate release tablet Commonly known as: Oxy IR/ROXICODONE       TAKE these medications    Acetaminophen Extra Strength 500 MG tablet Generic drug: acetaminophen Take 2 tablets (1,000 mg total) by mouth every 6 (six) hours as needed for headache, fever or moderate pain.   NovoLOG FlexPen 100 UNIT/ML FlexPen Generic drug: insulin aspart Inject 2 Units into the skin 3 (three) times daily with meals Inject 0-6 Units into the skin every 4 (four) hours. What changed: Another medication with the same name was removed. Continue taking this medication, and follow the directions you see here.        Follow-up Information     07-04-1996, MD Follow up in 1 week(s).   Specialties: Obstetrics and Gynecology, Radiology Contact information: [address] [place]  [address] [phone]         Thurmon Fair, MD .   Specialty: Cardiology Contact information: [address] [place] [address] [phone]                 Signed: Marrion Coy 04/28/2021, 10:29 AM

## 2021-04-30 NOTE — Progress Notes (Deleted)
Cardiology Office Note   Date:  04/30/2021   ID:  Ashley, Camacho 1991/02/15, MRN [id number]  PCP:  Rubie Maid, MD  Cardiologist:  Dr.Croitoru  No chief complaint on file.    History of Present Illness: Ashley Camacho is a 30 y.o. female who presents for ongoing assessment and management of CHF with mildly reduced left ventricular systolic function of uncertain etiology, with other history to include type 1 diabetes, adrenal insufficiency, severe hypoalbuminemia.  Was seen most recently during hospitalization after delivery of a 34-week gestation daughter and 03/04/2021.  She was diagnosed with acute diastolic heart failure.  She was treated with IV diuretics and sent home on p.o. Lasix.  Echocardiogram on 02/27/2021 revealed EF of 50% to 55%, there was a small pericardial effusion present posterior to the left ventricle with no evidence of tamponade.  There was moderate pulmonic valve regurgitation otherwise there was normal valvular function of other cardiac valves.  She was recently discharged on 04/28/2021 after admission with altered mental status and hypoglycemia.  Her blood glucose was found to be 30 and she was given oral glucose and brought to emergency room.  She was noted that her diabetes was diagnosed as "brittle diabetes," as she had not been using her insulin pump due to lack of insulin.  During hospitalization she was changed to 12 units of Lantus instead and met with diabetic coordinator who did set her up with an insulin pump.  Due to complaints of right arm pain she was checked for a DVT and found to be negative.  Furosemide and amlodipine were discontinued during hospitalization.    Past Medical History:  Diagnosis Date   Anemia    Gastroparesis    Hypertension    Type 1 diabetes [facility])     Past Surgical History:  Procedure Laterality Date   BIOPSY  01/14/2021   Procedure: BIOPSY;  Surgeon: Thornton Park, MD;  Location: [place];  Service:  Gastroenterology;;   CESAREAN SECTION     CESAREAN SECTION WITH BILATERAL TUBAL LIGATION N/A 02/17/2021   Procedure: CESAREAN SECTION WITH BILATERAL TUBAL LIGATION;  Surgeon: Truett Mainland, DO;  Location: [place] [facility];  Service: Obstetrics;  Laterality: N/A;   ESOPHAGOGASTRODUODENOSCOPY (EGD) WITH PROPOFOL N/A 01/14/2021   Procedure: ESOPHAGOGASTRODUODENOSCOPY (EGD) WITH PROPOFOL;  Surgeon: Thornton Park, MD;  Location: [place];  Service: Gastroenterology;  Laterality: N/A;   MOUTH SURGERY       Current Outpatient Medications  Medication Sig Dispense Refill   acetaminophen (TYLENOL) 500 MG tablet Take 2 tablets (1,000 mg total) by mouth every 6 (six) hours as needed for headache, fever or moderate pain. 30 tablet 3   insulin aspart (NOVOLOG) 100 UNIT/ML FlexPen Inject 2 Units into the skin 3 (three) times daily with meals Inject 0-6 Units into the skin every 4 (four) hours. 15 mL 11   No current facility-administered medications for this visit.    Allergies:   Patient has no known allergies.    Social History:  The patient  reports that she has never smoked. She has never used smokeless tobacco. She reports that she does not drink alcohol and does not use drugs.   Family History:  The patient's family history includes Breast cancer in her mother; CAD in her father, paternal grandfather, and paternal grandmother; Diabetes type I in her father.    ROS: All other systems are reviewed and negative. Unless otherwise mentioned in H&P    PHYSICAL EXAM: VS:  LMP  (LMP  Unknown)  , BMI There is no height or weight on file to calculate BMI. GEN: Well nourished, well developed, in no acute distress HEENT: normal Neck: no JVD, carotid bruits, or masses Cardiac: ***RRR; no murmurs, rubs, or gallops,no edema  Respiratory:  Clear to auscultation bilaterally, normal work of breathing GI: soft, nontender, nondistended, + BS MS: no deformity or atrophy Skin: warm and dry, no rash Neuro:   Strength and sensation are intact Psych: euthymic mood, full affect   EKG:  EKG {ACTION; IS/IS EVO:[id number]} ordered today. The ekg ordered today demonstrates ***   Recent Labs: 02/28/2021: B Natriuretic Peptide 715.6 04/27/2021: ALT 10; TSH 4.991 04/28/2021: BUN 30; Creatinine, Ser 1.16; Hemoglobin 9.2; Magnesium 2.0; Platelets 155; Potassium 4.9; Sodium 135    Lipid Panel    Component Value Date/Time   CHOL 115 12/21/2020 0550   TRIG 107 12/21/2020 0550   HDL 25 (L) 12/21/2020 0550   CHOLHDL 4.6 12/21/2020 0550   VLDL 21 12/21/2020 0550   LDLCALC 69 12/21/2020 0550      Wt Readings from Last 3 Encounters:  04/28/21 109 lb 9.1 oz (49.7 kg)  02/26/21 153 lb 3.2 oz (69.5 kg)  12/12/20 125 lb 10.6 oz (57 kg)      Other studies Reviewed: ECHO: 03/04/2021    1. Left ventricular ejection fraction, by estimation, is 50 to 55%. The  left ventricle has low normal function. The left ventricle demonstrates  global hypokinesis. Left ventricular diastolic parameters were normal.   2. Right ventricular systolic function is normal. The right ventricular  size is normal. There is normal pulmonary artery systolic pressure. The  estimated right ventricular systolic pressure is 82.9 mmHg.   3. A small pericardial effusion is present. The pericardial effusion is  posterior to the left ventricle. There is no evidence of cardiac  tamponade.   4. The mitral valve is normal in structure. Moderate mitral valve  regurgitation. No evidence of mitral stenosis.   5. The aortic valve is normal in structure. Aortic valve regurgitation is  trivial. No aortic stenosis is present.   6. Pulmonic valve regurgitation is moderate.   7. The inferior vena cava is normal in size with greater than 50%  respiratory variability, suggesting right atrial pressure of 3 mmHg  ASSESSMENT AND PLAN:  1.  ***   Current medicines are reviewed at length with the patient today.  I have spent *** dedicated to the  care of this patient on the date of this encounter to include pre-visit review of records, assessment, management and diagnostic testing,with shared decision making.  Labs/ tests ordered today include: *** Phill Myron. [place] Pugh, ANP, AACC   04/30/2021 11:12 AM    [place] [address] Office [phone] Fax [phone]  Notice: This dictation was prepared with Dragon dictation along with smaller phrase technology. Any transcriptional errors that result from this process are unintentional and may not be corrected upon review.

## 2021-05-01 ENCOUNTER — Ambulatory Visit: Payer: Medicaid Other | Admitting: Adult Health

## 2021-05-01 DIAGNOSIS — S5400XA Injury of ulnar nerve at forearm level, unspecified arm, initial encounter: Secondary | ICD-10-CM | POA: Diagnosis not present

## 2021-05-02 LAB — CULTURE, BLOOD (ROUTINE X 2): Culture: NO GROWTH

## 2021-05-03 LAB — CULTURE, BLOOD (ROUTINE X 2)
Culture: NO GROWTH
Special Requests: ADEQUATE

## 2021-05-05 ENCOUNTER — Inpatient Hospital Stay
Admission: EM | Admit: 2021-05-05 | Discharge: 2021-05-10 | DRG: 194 | Disposition: A | Discharge status: Home / Self Care | Payer: Medicaid Other | Attending: Internal Medicine | Admitting: Internal Medicine

## 2021-05-05 ENCOUNTER — Emergency Department: Payer: Medicaid Other

## 2021-05-05 DIAGNOSIS — T383X5A Adverse effect of insulin and oral hypoglycemic [antidiabetic] drugs, initial encounter: Secondary | ICD-10-CM

## 2021-05-05 DIAGNOSIS — M79601 Pain in right arm: Secondary | ICD-10-CM | POA: Diagnosis present

## 2021-05-05 DIAGNOSIS — O99345 Other mental disorders complicating the puerperium: Secondary | ICD-10-CM | POA: Diagnosis present

## 2021-05-05 DIAGNOSIS — E1043 Type 1 diabetes mellitus with diabetic autonomic (poly)neuropathy: Secondary | ICD-10-CM | POA: Diagnosis present

## 2021-05-05 DIAGNOSIS — E875 Hyperkalemia: Secondary | ICD-10-CM | POA: Diagnosis not present

## 2021-05-05 DIAGNOSIS — Z20822 Contact with and (suspected) exposure to covid-19: Secondary | ICD-10-CM | POA: Diagnosis present

## 2021-05-05 DIAGNOSIS — R52 Pain, unspecified: Secondary | ICD-10-CM | POA: Diagnosis not present

## 2021-05-05 DIAGNOSIS — A419 Sepsis, unspecified organism: Secondary | ICD-10-CM | POA: Diagnosis not present

## 2021-05-05 DIAGNOSIS — E1042 Type 1 diabetes mellitus with diabetic polyneuropathy: Secondary | ICD-10-CM | POA: Diagnosis not present

## 2021-05-05 DIAGNOSIS — Z79890 Hormone replacement therapy (postmenopausal): Secondary | ICD-10-CM | POA: Diagnosis not present

## 2021-05-05 DIAGNOSIS — R21 Rash and other nonspecific skin eruption: Secondary | ICD-10-CM | POA: Diagnosis not present

## 2021-05-05 DIAGNOSIS — R4182 Altered mental status, unspecified: Secondary | ICD-10-CM | POA: Diagnosis not present

## 2021-05-05 DIAGNOSIS — I10 Essential (primary) hypertension: Secondary | ICD-10-CM | POA: Diagnosis not present

## 2021-05-05 DIAGNOSIS — E10649 Type 1 diabetes mellitus with hypoglycemia without coma: Secondary | ICD-10-CM | POA: Diagnosis present

## 2021-05-05 DIAGNOSIS — F53 Postpartum depression: Secondary | ICD-10-CM | POA: Diagnosis not present

## 2021-05-05 DIAGNOSIS — E11649 Type 2 diabetes mellitus with hypoglycemia without coma: Secondary | ICD-10-CM | POA: Diagnosis not present

## 2021-05-05 DIAGNOSIS — Z5986 Financial insecurity: Secondary | ICD-10-CM | POA: Diagnosis not present

## 2021-05-05 DIAGNOSIS — Z794 Long term (current) use of insulin: Secondary | ICD-10-CM | POA: Diagnosis not present

## 2021-05-05 DIAGNOSIS — J189 Pneumonia, unspecified organism: Secondary | ICD-10-CM | POA: Diagnosis not present

## 2021-05-05 DIAGNOSIS — E039 Hypothyroidism, unspecified: Secondary | ICD-10-CM | POA: Diagnosis not present

## 2021-05-05 DIAGNOSIS — E2740 Unspecified adrenocortical insufficiency: Secondary | ICD-10-CM | POA: Diagnosis not present

## 2021-05-05 DIAGNOSIS — F331 Major depressive disorder, recurrent, moderate: Secondary | ICD-10-CM | POA: Diagnosis not present

## 2021-05-05 DIAGNOSIS — E1143 Type 2 diabetes mellitus with diabetic autonomic (poly)neuropathy: Secondary | ICD-10-CM | POA: Diagnosis present

## 2021-05-05 DIAGNOSIS — Z9641 Presence of insulin pump (external) (internal): Secondary | ICD-10-CM | POA: Diagnosis present

## 2021-05-05 DIAGNOSIS — Z8249 Family history of ischemic heart disease and other diseases of the circulatory system: Secondary | ICD-10-CM

## 2021-05-05 DIAGNOSIS — Z833 Family history of diabetes mellitus: Secondary | ICD-10-CM

## 2021-05-05 DIAGNOSIS — E160 Drug-induced hypoglycemia without coma: Secondary | ICD-10-CM | POA: Diagnosis not present

## 2021-05-05 DIAGNOSIS — R404 Transient alteration of awareness: Secondary | ICD-10-CM | POA: Diagnosis not present

## 2021-05-05 DIAGNOSIS — K3184 Gastroparesis: Secondary | ICD-10-CM | POA: Diagnosis present

## 2021-05-05 DIAGNOSIS — E161 Other hypoglycemia: Secondary | ICD-10-CM | POA: Diagnosis not present

## 2021-05-05 DIAGNOSIS — R000 Tachycardia, unspecified: Secondary | ICD-10-CM | POA: Diagnosis not present

## 2021-05-05 DIAGNOSIS — E162 Hypoglycemia, unspecified: Secondary | ICD-10-CM | POA: Diagnosis not present

## 2021-05-05 DIAGNOSIS — Z8639 Personal history of other endocrine, nutritional and metabolic disease: Secondary | ICD-10-CM

## 2021-05-05 DIAGNOSIS — G629 Polyneuropathy, unspecified: Secondary | ICD-10-CM

## 2021-05-05 HISTORY — DX: Unspecified adrenocortical insufficiency: E27.40

## 2021-05-05 LAB — CBC WITH DIFFERENTIAL/PLATELET
Abs Immature Granulocytes: 0.01 10*3/uL (ref 0.00–0.07)
Basophils Absolute: 0.1 10*3/uL (ref 0.0–0.1)
Basophils Relative: 1 %
Eosinophils Absolute: 0.2 10*3/uL (ref 0.0–0.5)
Eosinophils Relative: 3 %
HCT: 28.8 % — ABNORMAL LOW (ref 36.0–46.0)
Hemoglobin: 9.1 g/dL — ABNORMAL LOW (ref 12.0–15.0)
Immature Granulocytes: 0 %
Lymphocytes Relative: 19 %
Lymphs Abs: 1.2 10*3/uL (ref 0.7–4.0)
MCH: 29.8 pg (ref 26.0–34.0)
MCHC: 31.6 g/dL (ref 30.0–36.0)
MCV: 94.4 fL (ref 80.0–100.0)
Monocytes Absolute: 0.4 10*3/uL (ref 0.1–1.0)
Monocytes Relative: 7 %
Neutro Abs: 4.4 10*3/uL (ref 1.7–7.7)
Neutrophils Relative %: 70 %
Platelets: 192 10*3/uL (ref 150–400)
RBC: 3.05 MIL/uL — ABNORMAL LOW (ref 3.87–5.11)
RDW: 13.9 % (ref 11.5–15.5)
WBC: 6.2 10*3/uL (ref 4.0–10.5)
nRBC: 0 % (ref 0.0–0.2)

## 2021-05-05 LAB — RESP PANEL BY RT-PCR (FLU A&B, COVID) ARPGX2
Influenza A by PCR: NEGATIVE
Influenza B by PCR: NEGATIVE
SARS Coronavirus 2 by RT PCR: NEGATIVE

## 2021-05-05 LAB — BASIC METABOLIC PANEL
Anion gap: 12 (ref 5–15)
BUN: 26 mg/dL — ABNORMAL HIGH (ref 6–20)
CO2: 20 mmol/L — ABNORMAL LOW (ref 22–32)
Calcium: 8.3 mg/dL — ABNORMAL LOW (ref 8.9–10.3)
Chloride: 103 mmol/L (ref 98–111)
Creatinine, Ser: 1.28 mg/dL — ABNORMAL HIGH (ref 0.44–1.00)
GFR, Estimated: 58 mL/min — ABNORMAL LOW (ref >60)
Glucose, Bld: 408 mg/dL — ABNORMAL HIGH (ref 70–99)
Potassium: 6.4 mmol/L (ref 3.5–5.1)
Sodium: 135 mmol/L (ref 135–145)

## 2021-05-05 LAB — CBG MONITORING, ED
Glucose-Capillary: 179 mg/dL — ABNORMAL HIGH (ref 70–99)
Glucose-Capillary: 56 mg/dL — ABNORMAL LOW (ref 70–99)
Glucose-Capillary: 137 mg/dL — ABNORMAL HIGH (ref 70–99)
Glucose-Capillary: 96 mg/dL (ref 70–99)
Glucose-Capillary: 117 mg/dL — ABNORMAL HIGH (ref 70–99)
Glucose-Capillary: 151 mg/dL — ABNORMAL HIGH (ref 70–99)
Glucose-Capillary: 279 mg/dL — ABNORMAL HIGH (ref 70–99)

## 2021-05-05 LAB — COMPREHENSIVE METABOLIC PANEL
ALT: 15 U/L (ref 0–44)
AST: 23 U/L (ref 15–41)
Albumin: 3.9 g/dL (ref 3.5–5.0)
Alkaline Phosphatase: 55 U/L (ref 38–126)
Anion gap: 3 — ABNORMAL LOW (ref 5–15)
BUN: 29 mg/dL — ABNORMAL HIGH (ref 6–20)
CO2: 26 mmol/L (ref 22–32)
Calcium: 8.5 mg/dL — ABNORMAL LOW (ref 8.9–10.3)
Chloride: 108 mmol/L (ref 98–111)
Creatinine, Ser: 1.5 mg/dL — ABNORMAL HIGH (ref 0.44–1.00)
GFR, Estimated: 48 mL/min — ABNORMAL LOW (ref >60)
Glucose, Bld: 180 mg/dL — ABNORMAL HIGH (ref 70–99)
Potassium: 6 mmol/L — ABNORMAL HIGH (ref 3.5–5.1)
Sodium: 137 mmol/L (ref 135–145)
Total Bilirubin: 0.6 mg/dL (ref 0.3–1.2)
Total Protein: 7.3 g/dL (ref 6.5–8.1)

## 2021-05-05 LAB — GLUCOSE, CAPILLARY
Glucose-Capillary: 125 mg/dL — ABNORMAL HIGH (ref 70–99)
Glucose-Capillary: 14 mg/dL — CL (ref 70–99)
Glucose-Capillary: 215 mg/dL — ABNORMAL HIGH (ref 70–99)
Glucose-Capillary: 334 mg/dL — ABNORMAL HIGH (ref 70–99)

## 2021-05-05 LAB — LACTIC ACID, PLASMA: Lactic Acid, Venous: 1.3 mmol/L (ref 0.5–1.9)

## 2021-05-05 LAB — LIPASE, BLOOD: Lipase: 21 U/L (ref 11–51)

## 2021-05-05 MED ORDER — SODIUM CHLORIDE 0.9 % IV SOLN
2.0000 g | INTRAVENOUS | Status: AC
  Administered 2021-05-05 – 2021-05-09 (×5): 2 g via INTRAVENOUS
  Filled 2021-05-05 (×2): qty 20
  Filled 2021-05-05 (×3): qty 2

## 2021-05-05 MED ORDER — SODIUM BICARBONATE 8.4 % IV SOLN
50.0000 meq | Freq: Once | INTRAVENOUS | Status: AC
  Administered 2021-05-05: 50 meq via INTRAVENOUS
  Filled 2021-05-05: qty 50

## 2021-05-05 MED ORDER — DEXTROSE 10 % IV SOLN: INTRAVENOUS | Status: DC

## 2021-05-05 MED ORDER — DEXTROSE 50 % IV SOLN
INTRAVENOUS | Status: AC
  Administered 2021-05-06: 50 mL
  Filled 2021-05-05: qty 50

## 2021-05-05 MED ORDER — SODIUM CHLORIDE 0.9 % IV BOLUS (SEPSIS)
1000.0000 mL | Freq: Once | INTRAVENOUS | Status: AC
  Administered 2021-05-05: 1000 mL via INTRAVENOUS

## 2021-05-05 MED ORDER — SODIUM CHLORIDE 0.9 % IV SOLN: INTRAVENOUS | Status: DC

## 2021-05-05 MED ORDER — SODIUM CHLORIDE 0.9 % IV SOLN: 500.0000 mg | INTRAVENOUS | Status: DC

## 2021-05-05 MED ORDER — ONDANSETRON HCL 4 MG/2ML IJ SOLN
4.0000 mg | Freq: Once | INTRAMUSCULAR | Status: AC
  Administered 2021-05-05: 4 mg via INTRAVENOUS

## 2021-05-05 MED ORDER — INSULIN ASPART 100 UNIT/ML IJ SOLN
0.0000 [IU] | Freq: Three times a day (TID) | INTRAMUSCULAR | Status: DC
Start: 2021-05-05 — End: 2021-05-06
  Administered 2021-05-05: 5 [IU] via SUBCUTANEOUS
  Administered 2021-05-05: 1 [IU] via SUBCUTANEOUS
  Administered 2021-05-06: 09:00:00 9 [IU] via SUBCUTANEOUS
  Filled 2021-05-05 (×3): qty 1

## 2021-05-05 MED ORDER — ACETAMINOPHEN 325 MG PO TABS
650.0000 mg | ORAL_TABLET | Freq: Four times a day (QID) | ORAL | Status: DC | PRN
  Administered 2021-05-05 – 2021-05-06 (×3): 650 mg via ORAL
  Filled 2021-05-05 (×3): qty 2

## 2021-05-05 MED ORDER — SODIUM CHLORIDE 0.9 % IV SOLN: 2.0000 g | INTRAVENOUS | Status: DC

## 2021-05-05 MED ORDER — DEXTROSE 50 % IV SOLN
1.0000 | Freq: Once | INTRAVENOUS | Status: AC
  Administered 2021-05-05: 50 mL via INTRAVENOUS

## 2021-05-05 MED ORDER — MAGNESIUM HYDROXIDE 400 MG/5ML PO SUSP
30.0000 mL | Freq: Every day | ORAL | Status: DC | PRN
  Filled 2021-05-05: qty 30

## 2021-05-05 MED ORDER — SODIUM CHLORIDE 0.9 % IV SOLN
500.0000 mg | INTRAVENOUS | Status: AC
  Administered 2021-05-05 – 2021-05-09 (×5): 500 mg via INTRAVENOUS
  Filled 2021-05-05 (×3): qty 500
  Filled 2021-05-05: qty 5
  Filled 2021-05-05: qty 500

## 2021-05-05 MED ORDER — ONDANSETRON HCL 4 MG PO TABS: 4.0000 mg | ORAL_TABLET | Freq: Four times a day (QID) | ORAL | Status: DC | PRN

## 2021-05-05 MED ORDER — ONDANSETRON HCL 4 MG/2ML IJ SOLN
4.0000 mg | Freq: Four times a day (QID) | INTRAMUSCULAR | Status: DC | PRN
  Administered 2021-05-05 – 2021-05-07 (×6): 4 mg via INTRAVENOUS
  Filled 2021-05-05 (×6): qty 2

## 2021-05-05 MED ORDER — TRAZODONE HCL 50 MG PO TABS
25.0000 mg | ORAL_TABLET | Freq: Every evening | ORAL | Status: DC | PRN
  Administered 2021-05-08: 25 mg via ORAL
  Filled 2021-05-05: qty 1

## 2021-05-05 MED ORDER — SODIUM ZIRCONIUM CYCLOSILICATE 10 G PO PACK
10.0000 g | PACK | Freq: Once | ORAL | Status: AC
Start: 2021-05-05 — End: 2021-05-05
  Administered 2021-05-05: 10 g via ORAL
  Filled 2021-05-05: qty 1

## 2021-05-05 MED ORDER — ACETAMINOPHEN 650 MG RE SUPP
650.0000 mg | Freq: Four times a day (QID) | RECTAL | Status: DC | PRN
  Filled 2021-05-05: qty 1

## 2021-05-05 MED ORDER — ONDANSETRON HCL 4 MG/2ML IJ SOLN
INTRAMUSCULAR | Status: AC
  Filled 2021-05-05: qty 2

## 2021-05-05 MED ORDER — DEXTROSE 50 % IV SOLN
INTRAVENOUS | Status: AC
  Filled 2021-05-05: qty 50

## 2021-05-05 MED ORDER — CALCIUM GLUCONATE-NACL 1-0.675 GM/50ML-% IV SOLN
1.0000 g | Freq: Once | INTRAVENOUS | Status: AC
  Administered 2021-05-05: 1000 mg via INTRAVENOUS
  Filled 2021-05-05: qty 50

## 2021-05-05 MED ORDER — ENOXAPARIN SODIUM 40 MG/0.4ML IJ SOSY
40.0000 mg | PREFILLED_SYRINGE | INTRAMUSCULAR | Status: DC
  Administered 2021-05-05 – 2021-05-09 (×5): 40 mg via SUBCUTANEOUS
  Filled 2021-05-05 (×5): qty 0.4

## 2021-05-05 MED ORDER — METOCLOPRAMIDE HCL 5 MG/ML IJ SOLN
10.0000 mg | Freq: Four times a day (QID) | INTRAMUSCULAR | Status: DC
  Administered 2021-05-05 – 2021-05-09 (×15): 10 mg via INTRAVENOUS
  Filled 2021-05-05 (×15): qty 2

## 2021-05-05 MED ORDER — GLUCOSE 4 G PO CHEW
CHEWABLE_TABLET | ORAL | Status: AC
  Filled 2021-05-05: qty 1

## 2021-05-05 NOTE — ED Notes (Signed)
Pt. Vomiting again, dr. Arville Care, notified, states ok to give pt's scheduled 1800 Reglan now

## 2021-05-05 NOTE — ED Notes (Signed)
Pt's oral temp is 100.9, Dr. Arville Care notified, see orders.

## 2021-05-05 NOTE — Progress Notes (Signed)
Patient states that she is too tired to go through admission questions right now and would like [place] to ask her when she is feeling a little bit better. Requesting for RN to go through admission questions in the morning.

## 2021-05-05 NOTE — Progress Notes (Addendum)
°   05/05/21 1858  Provider Notification  Provider Name/Title Dr. Arville Care  Date Provider Notified 05/05/21  Time Provider Notified 1858  Notification Type Page  Notification Reason Critical result  Test performed and critical result K 6.4  Date Critical Result Received 05/05/21  Time Critical Result Received 1857  Provider response Other (Comment) (waiting for response)   Dr. Arville Care returned page and had RN order Lokelma 10 grams PO, and reorder sodium bicarbonate injection 50 mEq and calcium gluconate 1 gram IVPB that the patient received earlier in the ED. He asked that her potassium level be redrawn in 3 hours and notify on call provider if still elevated.

## 2021-05-05 NOTE — ED Triage Notes (Signed)
C/o persistent hypoglylcemia today. Per medic pt's BGL was 58 on scene, pt received 18mL of D50 enroute, last BGL for medics was 118. Pt. Is awake, able to respond by shaking head yes/no, but unable to verbalize currently. Medic states 911 was called this AM for same reason.

## 2021-05-05 NOTE — H&P (Addendum)
[place]   PATIENT NAME: Ashley Camacho    MR#:  [id number]  DATE OF BIRTH:  [birth year]  DATE OF ADMISSION:  05/05/2021  PRIMARY CARE PHYSICIAN: Pcp, No   Patient is coming from: Home  REQUESTING/REFERRING PHYSICIAN: Sharman Cheek, MD  CHIEF COMPLAINT:   Chief Complaint  Patient presents with   Hypoglycemia    C/o persistent hypoglylcemia today. Per medic pt's BGL was 58 on scene, pt received 52mL of D50 enroute, last BGL for medics was 118. Pt. Is awake, able to respond by shaking head yes/no, but unable to verbalize currently. Medic states 911 was called this AM for same reason.    HISTORY OF PRESENT ILLNESS:  Ashley Camacho is a 30 y.o. Caucasian female with medical history significant for type 1 diabetes mellitus on insulin pump, hypertension diabetic gastroparesis and recent observation here for hypoglycemia on 12/5 that were thought be related to taking Lantus as she was not able to use her insulin pump due to lack of insulin.  She presents with recurrent vomiting over the last couple of days and was noted to be hypoglycemic by EMS with blood glucose of 58 cm for which she was given an amp of D50.  She later dropped in the ER was given another amp of D50.  Her  insulin pump was stopped.  She denies taking any long-acting insulin.  She denies any bloody vomitus or hematemesis.  She admits to dry cough over the last several days without wheezing or dyspnea.  She denies any fever or chills.  No dysuria, oliguria or urinary frequency or urgency or flank pain.  ED Course: When she came to the ER, heart rate was 106 with otherwise normal vital signs and later respiratory rate was 27 then down to 15.  Labs revealed a CMP remarkable for hyperkalemia of 6 and a glucose of 180 with a BUN of 29 and creatinine 1.5 with a calcium of 8.5.  Lactic acid was 1.3.  CBC showed anemia close to baseline.  Influenza antigens and COVID-19 PCR came back negative.  Blood cultures were  drawn.  Imaging: Chest x-ray showed small right midlung opacity could be developing infection.  The patient was given IV Rocephin and Zithromax as well as an amp of D50, 4 mg IV Zofran and 1 L bolus of IV normal saline then was started on D10W at 100 ml/h.  She will be admitted to a medical monitored bed for further evaluation and management. PAST MEDICAL HISTORY:   Past Medical History:  Diagnosis Date   Anemia    Gastroparesis    Hypertension    Type 1 diabetes (HCC)     PAST SURGICAL HISTORY:   Past Surgical History:  Procedure Laterality Date   BIOPSY  01/14/2021   Procedure: BIOPSY;  Surgeon: Tressia Danas, MD;  Location: [facility];  Service: Gastroenterology;;   CESAREAN SECTION     CESAREAN SECTION WITH BILATERAL TUBAL LIGATION N/A 02/17/2021   Procedure: CESAREAN SECTION WITH BILATERAL TUBAL LIGATION;  Surgeon: [name], DO;  Location: [facility];  Service: Obstetrics;  Laterality: N/A;   ESOPHAGOGASTRODUODENOSCOPY (EGD) WITH PROPOFOL N/A 01/14/2021   Procedure: ESOPHAGOGASTRODUODENOSCOPY (EGD) WITH PROPOFOL;  Surgeon: Tressia Danas, MD;  Location: [facility] ENDOSCOPY;  Service: Gastroenterology;  Laterality: N/A;   MOUTH SURGERY      SOCIAL HISTORY:   Social History   Tobacco Use   Smoking status: Never   Smokeless tobacco: Never  Substance Use Topics  Alcohol use: No    FAMILY HISTORY:   Family History  Problem Relation Age of Onset   Diabetes type I Father    CAD Father    CAD Paternal Grandmother    CAD Paternal Grandfather    Breast cancer Mother    Ovarian cancer Neg Hx     DRUG ALLERGIES:  Not on File  REVIEW OF SYSTEMS:   ROS As per history of present illness. All pertinent systems were reviewed above. Constitutional, HEENT, cardiovascular, respiratory, GI, GU, musculoskeletal, neuro, psychiatric, endocrine, integumentary and hematologic systems were reviewed and are otherwise negative/unremarkable except for positive findings  mentioned above in the HPI.   MEDICATIONS AT HOME:   Prior to Admission medications   Medication Sig Start Date End Date Taking? Authorizing Provider  acetaminophen (TYLENOL) 500 MG tablet Take 2 tablets (1,000 mg total) by mouth every 6 (six) hours as needed for headache, fever or moderate pain. 03/04/21   Constant, Peggy, MD  insulin aspart (NOVOLOG) 100 UNIT/ML FlexPen Inject 2 Units into the skin 3 (three) times daily with meals Inject 0-6 Units into the skin every 4 (four) hours. 03/04/21   Constant, Peggy, MD      VITAL SIGNS:  Blood pressure 124/63, pulse (!) 109, temperature 98.5 F (36.9 C), resp. rate (!) 27, SpO2 100 %, unknown if currently breastfeeding.  PHYSICAL EXAMINATION:  Physical Exam  GENERAL:  30 y.o.-year-old Caucasian female patient lying in the bed with no acute distress.  EYES: Pupils equal, round, reactive to light and accommodation. No scleral icterus. Extraocular muscles intact.  HEENT: Head atraumatic, normocephalic. Oropharynx and nasopharynx clear.  NECK:  Supple, no jugular venous distention. No thyroid enlargement, no tenderness.  LUNGS: Slightly diminished breath midlung zone breath sounds with associated crackles.  No use of accessory muscles of respiration.  CARDIOVASCULAR: Regular rate and rhythm, S1, S2 normal. No murmurs, rubs, or gallops.  ABDOMEN: Soft, nondistended, nontender. Bowel sounds present. No organomegaly or mass.  EXTREMITIES: No pedal edema, cyanosis, or clubbing.  NEUROLOGIC: Cranial nerves II through XII are intact. Muscle strength 5/5 in all extremities. Sensation intact. Gait not checked.  PSYCHIATRIC: The patient is alert and oriented x 3.  Normal affect and good eye contact. SKIN: No obvious rash, lesion, or ulcer.   LABORATORY PANEL:   CBC Recent Labs  Lab 05/05/21 1209  WBC 6.2  HGB 9.1*  HCT 28.8*  PLT 192    ------------------------------------------------------------------------------------------------------------------  Chemistries  Recent Labs  Lab 05/05/21 1209  NA 137  K 6.0*  CL 108  CO2 26  GLUCOSE 180*  BUN 29*  CREATININE 1.50*  CALCIUM 8.5*  AST 23  ALT 15  ALKPHOS 55  BILITOT 0.6   ------------------------------------------------------------------------------------------------------------------  Cardiac Enzymes No results for input(s): TROPONINI in the last 168 hours. ------------------------------------------------------------------------------------------------------------------  RADIOLOGY:  DG Chest Portable 1 View  Result Date: 05/05/2021 CLINICAL DATA:  AMS, hypoglycemia EXAM: PORTABLE CHEST 1 VIEW COMPARISON:  Radiograph 04/27/2021 FINDINGS: Unchanged cardiomediastinal silhouette. There is a small right midlung opacity new from prior exam. No large pleural effusion. No visible pneumothorax. No acute osseous abnormality. IMPRESSION: Small right midlung opacity new from prior exam, could be developing infection. Electronically Signed   By: Caprice Renshaw M.D.   On: 05/05/2021 12:36      IMPRESSION AND PLAN:  Principal Problem:   CAP (community acquired pneumonia)  1.  Right midlung community-acquired pneumonia with subsequent sepsis as manifested by tachycardia and tachypnea.. - The patient will be admitted to  a medical monitored bed. - We will continue antibiotic therapy with IV Rocephin and Zithromax. - We will follow blood and sputum culture. - Mucolytic therapy and bronchodilator therapy will be given as needed. - She will be hydrated with IV normal saline.  2.  Hypoglycemia block secondary to #1. - Hypoglycemia protocol will be followed. - We will avoid long-acting insulin. - Insulin pump was held off.  3.  Hyperkalemia. - She will be given an amp of sodium bicarbonate and 1 amp of calcium gluconate. - We will follow potassium level.  4.   Peripheral neuropathy. - We will continue Neurontin.   DVT prophylaxis: Lovenox.  Code Status: full code.  Family Communication:  The plan of care was discussed in details with the patient (and family). I answered all questions. The patient agreed to proceed with the above mentioned plan. Further management will depend upon hospital course. Disposition Plan: Back to previous home environment Consults called: none.  All the records are reviewed and case discussed with ED provider.  Status is: Inpatient  Remains inpatient appropriate because:Ongoing diagnostic testing needed not appropriate for outpatient work up, Unsafe d/c plan, IV treatments appropriate due to intensity of illness or inability to take PO, and Inpatient level of care appropriate due to severity of illness   Dispo: The patient is from: Home              Anticipated d/c is to: Home              Patient currently is not medically stable to d/c.              Difficult to place patient: No  TOTAL TIME TAKING CARE OF THIS PATIENT: 55 minutes.     Hannah Beat M.D on 05/05/2021 at 1:50 PM  [facility]   From 7 PM-7 AM, contact night-coverage [email or web address]  CC: Primary care physician; Pcp, No

## 2021-05-05 NOTE — ED Notes (Signed)
Dr. Scotty Court verbalized to this RN, only one set of blood cultures needed.

## 2021-05-05 NOTE — ED Notes (Signed)
Pt's BGL 279, Dr. Arville Care notified, see MAR for orders.

## 2021-05-05 NOTE — Progress Notes (Signed)
°   05/05/21 1802  Provider Notification  Provider Name/Title Dr. Arville Care  Date Provider Notified 05/05/21  Time Provider Notified [phone]  Notification Type Page (secure chat)  Notification Reason Red med refusal (Patient CBG calls for 7 units of novolog. Patient feels this it too much. Refusing at this time.)  Provider response See new orders (Give 5 units)  Date of Provider Response 05/05/21  Time of Provider Response [phone]

## 2021-05-05 NOTE — ED Provider Notes (Signed)
[facility] Emergency Department Provider Note  ____________________________________________  Time seen: Approximately 12:51 PM  I have reviewed the triage vital signs and the nursing notes.   HISTORY  Chief Complaint Hypoglycemia (C/o persistent hypoglylcemia today. Per medic pt's BGL was 58 on scene, pt received 40mL of D50 enroute, last BGL for medics was 118. Pt. Is awake, able to respond by shaking head yes/no, but unable to verbalize currently. Medic states 911 was called this AM for same reason.)    Level 5 Caveat: Portions of the History and Physical including HPI and review of systems are unable to be completely obtained due to patient being a poor historian   HPI Ashley Camacho is a 30 y.o. female with a history of hypertension, type 1 diabetes with insulin pump, gastroparesis who is brought to the ED due to hypoglycemia.  Per EMS, they were called out to the patient for low blood sugar this morning, they fed her a sandwich and she seemed better so they did not bring her to the hospital at that time.  They were called out again and found her blood sugar to be in the 50s, they gave some D50 during transport.  Patient currently not able to answer questions.  Shakes her head to deny focal pain or acute symptoms.    Past Medical History:  Diagnosis Date   Anemia    Gastroparesis    Hypertension    Type 1 diabetes (HCC)      Patient Active Problem List   Diagnosis Date Noted   CAP (community acquired pneumonia) 05/05/2021   Hypoglycemia 04/27/2021   Postoperative hematoma involving genitourinary system following genitourinary procedure    S/P cesarean section 02/17/2021   Acute esophagitis    Leg edema    History of preterm delivery, currently pregnant    Pre-existing type 1 diabetes mellitus during pregnancy in third trimester    Anemia during pregnancy    Type 1 diabetes mellitus affecting pregnancy in second trimester, antepartum    HFrEF  (heart failure with reduced ejection fraction) (HCC)    Acute deep vein thrombosis (DVT) of brachial vein of right upper extremity (HCC) 12/17/2020   Anxiety 12/17/2020   Diabetic retinopathy (HCC) 12/17/2020   Acute on chronic combined systolic and diastolic CHF (congestive heart failure) (HCC)    Hyperemesis 12/13/2020   DKA, type 1, not at goal [facility]) 12/13/2020   Sunburn 12/13/2020   Hypothyroidism 12/10/2020   Malnutrition (HCC) 12/10/2020   Vitreous hemorrhage of right eye (HCC) 12/10/2020   Pleural effusion associated with pulmonary infection 11/25/2020   Pneumonia affecting pregnancy 11/24/2020   Diabetes mellitus affecting pregnancy, second trimester 11/24/2020   Edema 11/24/2020   Decreased urine output 11/24/2020   Shortness of breath    Zinc deficiency    SOB (shortness of breath)    Social problem 11/21/2020   Nausea and vomiting during pregnancy prior to [redacted] weeks gestation 11/21/2020   Low serum vitamin B12    Delivery with history of C-section 11/20/2020   Absolute anemia    Acute kidney injury (HCC)    Nausea and vomiting during pregnancy 10/23/2020   History of premature delivery, currently pregnant, second trimester 10/13/2020   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   Depressive disorder 09/28/2018   Neuropathy 09/28/2018   Gastroparesis due to DM (HCC) 04/22/2016   Diabetic sensorimotor neuropathy (HCC) 04/19/2016   DKA (diabetic ketoacidoses) 04/03/2016   Microalbuminuria 09/26/2005     Past Surgical History:  Procedure Laterality  Date   BIOPSY  01/14/2021   Procedure: BIOPSY;  Surgeon: Tressia Danas, MD;  Location: [facility] ENDOSCOPY;  Service: Gastroenterology;;   CESAREAN SECTION     CESAREAN SECTION WITH BILATERAL TUBAL LIGATION N/A 02/17/2021   Procedure: CESAREAN SECTION WITH BILATERAL TUBAL LIGATION;  Surgeon: [name], DO;  Location: [facility] LD ORS;  Service: Obstetrics;  Laterality: N/A;   ESOPHAGOGASTRODUODENOSCOPY (EGD) WITH PROPOFOL N/A 01/14/2021    Procedure: ESOPHAGOGASTRODUODENOSCOPY (EGD) WITH PROPOFOL;  Surgeon: Tressia Danas, MD;  Location: [facility] ENDOSCOPY;  Service: Gastroenterology;  Laterality: N/A;   MOUTH SURGERY       Prior to Admission medications   Medication Sig Start Date End Date Taking? Authorizing Provider  acetaminophen (TYLENOL) 500 MG tablet Take 2 tablets (1,000 mg total) by mouth every 6 (six) hours as needed for headache, fever or moderate pain. 03/04/21   Constant, Peggy, MD  insulin aspart (NOVOLOG) 100 UNIT/ML FlexPen Inject 2 Units into the skin 3 (three) times daily with meals Inject 0-6 Units into the skin every 4 (four) hours. 03/04/21   Constant, Peggy, MD     Allergies Patient has no allergy information on record.   Family History  Problem Relation Age of Onset   Diabetes type I Father    CAD Father    CAD Paternal Grandmother    CAD Paternal Grandfather    Breast cancer Mother    Ovarian cancer Neg Hx     Social History Social History   Tobacco Use   Smoking status: Never   Smokeless tobacco: Never  Vaping Use   Vaping Use: Never used  Substance Use Topics   Alcohol use: No   Drug use: No    Review of Systems Level 5 Caveat: Portions of the History and Physical including HPI and review of systems are unable to be completely obtained due to patient being a poor historian   Constitutional:   No known fever.  ENT:   No rhinorrhea. Cardiovascular:   No chest pain or syncope. Respiratory:   No dyspnea or cough. Gastrointestinal:   Negative for abdominal pain, vomiting and diarrhea.  Musculoskeletal:   Negative for focal pain or swelling ____________________________________________   PHYSICAL EXAM:  VITAL SIGNS: ED Triage Vitals [05/05/21 1145]  Enc Vitals Group     BP 119/82     Pulse Rate (!) 106     Resp 16     Temp 98.5 F (36.9 C)     Temp src      SpO2 100 %     Weight      Height      Head Circumference      Peak Flow      Pain Score      Pain Loc       Pain Edu?      Excl. in GC?     Vital signs reviewed, nursing assessments reviewed.   Constitutional: Awake and alert, not oriented.  Ill-appearing. Eyes:   Conjunctivae are normal. EOMI. PERRL. ENT      Head:   Normocephalic and atraumatic.      Nose:   No congestion/rhinnorhea.       Mouth/Throat:   MMM, no pharyngeal erythema. No peritonsillar mass.       Neck:   No meningismus. Full ROM. Hematological/Lymphatic/Immunilogical:   No cervical lymphadenopathy. Cardiovascular:   Tachycardia heart rate 105. Symmetric bilateral radial and DP pulses.  No murmurs. Cap refill less than 2 seconds. Respiratory:   Normal respiratory  effort without tachypnea/retractions. Breath sounds are clear and equal bilaterally. No wheezes/rales/rhonchi. Gastrointestinal:   Soft and nontender. Non distended. There is no CVA tenderness.  No rebound, rigidity, or guarding. Genitourinary:   deferred Musculoskeletal:   Normal range of motion in all extremities. No joint effusions.  No lower extremity tenderness.  No edema. Neurologic:   Currently nonverbal Skin:    Skin is warm, dry and intact. No rash noted.  No petechiae, purpura, or bullae.  ____________________________________________    LABS (pertinent positives/negatives) (all labs ordered are listed, but only abnormal results are displayed) Labs Reviewed  COMPREHENSIVE METABOLIC PANEL - Abnormal; Notable for the following components:      Result Value   Potassium 6.0 (*)    Glucose, Bld 180 (*)    BUN 29 (*)    Creatinine, Ser 1.50 (*)    Calcium 8.5 (*)    GFR, Estimated 48 (*)    Anion gap 3 (*)    All other components within normal limits  CBC WITH DIFFERENTIAL/PLATELET - Abnormal; Notable for the following components:   RBC 3.05 (*)    Hemoglobin 9.1 (*)    HCT 28.8 (*)    All other components within normal limits  CBG MONITORING, ED - Abnormal; Notable for the following components:   Glucose-Capillary 56 (*)    All other components  within normal limits  CBG MONITORING, ED - Abnormal; Notable for the following components:   Glucose-Capillary 179 (*)    All other components within normal limits  CBG MONITORING, ED - Abnormal; Notable for the following components:   Glucose-Capillary 137 (*)    All other components within normal limits  CBG MONITORING, ED - Abnormal; Notable for the following components:   Glucose-Capillary 117 (*)    All other components within normal limits  RESP PANEL BY RT-PCR (FLU A&B, COVID) ARPGX2  CULTURE, BLOOD (ROUTINE X 2)  CULTURE, BLOOD (ROUTINE X 2)  LIPASE, BLOOD  LACTIC ACID, PLASMA  URINALYSIS, ROUTINE W REFLEX MICROSCOPIC  LACTIC ACID, PLASMA  CBG MONITORING, ED   ____________________________________________   EKG  Interpreted by me Sinus tachycardia rate 108, normal axis, normal intervals.  Poor R wave progression.  Normal ST segments and T waves.  ____________________________________________    RADIOLOGY  DG Chest Portable 1 View  Result Date: 05/05/2021 CLINICAL DATA:  AMS, hypoglycemia EXAM: PORTABLE CHEST 1 VIEW COMPARISON:  Radiograph 04/27/2021 FINDINGS: Unchanged cardiomediastinal silhouette. There is a small right midlung opacity new from prior exam. No large pleural effusion. No visible pneumothorax. No acute osseous abnormality. IMPRESSION: Small right midlung opacity new from prior exam, could be developing infection. Electronically Signed   By: Caprice Renshaw M.D.   On: 05/05/2021 12:36    ____________________________________________   PROCEDURES .Critical Care Performed by: Sharman Cheek, MD Authorized by: Sharman Cheek, MD   Critical care provider statement:    Critical care time (minutes):  35   Critical care time was exclusive of:  Separately billable procedures and treating other patients   Critical care was necessary to treat or prevent imminent or life-threatening deterioration of the following conditions:  Sepsis, endocrine crisis and  dehydration   Critical care was time spent personally by me on the following activities:  Development of treatment plan with patient or surrogate, discussions with consultants, evaluation of patient's response to treatment, examination of patient, obtaining history from patient or surrogate, ordering and performing treatments and interventions, ordering and review of laboratory studies, ordering and review of radiographic studies,  pulse oximetry, re-evaluation of patient's condition and review of old charts  ____________________________________________  DIFFERENTIAL DIAGNOSIS   UTI, pneumonia, dehydration, electrolyte abnormality  CLINICAL IMPRESSION / ASSESSMENT AND PLAN / ED COURSE  Medications ordered in the ED: Medications  dextrose 50 % solution (  Not Given 05/05/21 1207)  cefTRIAXone (ROCEPHIN) 2 g in sodium chloride 0.9 % 100 mL IVPB (2 g Intravenous New Bag/Given 05/05/21 1338)  azithromycin (ZITHROMAX) 500 mg in sodium chloride 0.9 % 250 mL IVPB (has no administration in time range)  ondansetron (ZOFRAN) 4 MG/2ML injection (  Not Given 05/05/21 1339)  dextrose 10 % infusion ( Intravenous New Bag/Given 05/05/21 1334)  dextrose 50 % solution 50 mL (50 mLs Intravenous Given 05/05/21 1155)  sodium chloride 0.9 % bolus 1,000 mL (0 mLs Intravenous Stopped 05/05/21 1338)  ondansetron (ZOFRAN) injection 4 mg (4 mg Intravenous Given 05/05/21 1327)    Pertinent labs & imaging results that were available during my care of the patient were reviewed by me and considered in my medical decision making (see chart for details).   Ashley Camacho was evaluated in Emergency Department on 05/05/2021 for the symptoms described in the history of present illness. She was evaluated in the context of the global COVID-19 pandemic, which necessitated consideration that the patient might be at risk for infection with the SARS-CoV-2 virus that causes COVID-19. Institutional protocols and algorithms that  pertain to the evaluation of patients at risk for COVID-19 are in a state of rapid change based on information released by regulatory bodies including the CDC and federal and state organizations. These policies and algorithms were followed during the patient's care in the ED.     Clinical Course as of 05/05/21 1351  Tue May 05, 2021  1205 Patient presents with hypoglycemia and altered mental status.  Amp of D50 given on arrival.  She has a insulin pump.  Unable to obtain any CGM data from the device, but it does appear to be giving about 4 units/day including this morning.  I paused the device on arrival. [PS]    Clinical Course User Index [PS] Sharman Cheek, MD    ----------------------------------------- 1:51 PM on 05/05/2021 ----------------------------------------- Patient with persistently downtrending CBG from 180 after the D50 bolus down to 90 over 1 hour.  D10 infusion started to avoid recurrent hypoglycemia.  Chest x-ray shows right middle lobe pneumonia.  She is tachycardic and tachypneic, no respiratory distress.  She is received Rocephin and azithromycin.  Lactate is normal, blood pressure is normal.  Case discussed with hospitalist for further management.   ____________________________________________   FINAL CLINICAL IMPRESSION(S) / ED DIAGNOSES    Final diagnoses:  Type 1 diabetes mellitus with hypoglycemia and without coma (HCC)  Community acquired pneumonia of right middle lobe of lung     ED Discharge Orders     None       Portions of this note were generated with dragon dictation software. Dictation errors may occur despite best attempts at proofreading.   Sharman Cheek, MD 05/05/21 [phone]

## 2021-05-05 NOTE — ED Notes (Signed)
This RN to bedside, pt. Began vomiting, Dr. Scotty Court notified, see [facility] for orders. Dr. Scotty Court also notified pt's current BGL, and that her BGL has been dropping pretty quickly. See MAR for orders.

## 2021-05-05 NOTE — ED Notes (Signed)
Pt. Contineus to be sleepy, but rouses easily. Pt. Unable to speak, but indicates answers to questions with ease. Pt. Indicates with nods, pointing, and hand gestures.

## 2021-05-05 NOTE — Progress Notes (Signed)
°   05/05/21 1716  Provider Notification  Provider Name/Title Dr. Arville Care  Date Provider Notified 05/05/21  Time Provider Notified [phone]  Notification Type Page (secure chat)  Notification Reason Other (Comment) (Order for Q1 hour VS until 12/13 2000 followed by Q2 hour VS until 12/23. Do you really want Q2 hour VS for 10 days?)  Provider response See new orders (Okay to change to Q4 VS once Q1 hour VS complete.)  Date of Provider Response 05/05/21  Time of Provider Response [phone]

## 2021-05-05 NOTE — Progress Notes (Signed)
°   05/05/21 1732  Assess: MEWS Score  Temp 100.1 F (37.8 C)  BP 114/79  Pulse Rate (!) 112  Resp 18  Level of Consciousness Alert  SpO2 100 %  O2 Device Room Air  Patient Activity (if Appropriate) In bed  Assess: MEWS Score  MEWS Temp 0  MEWS Systolic 0  MEWS Pulse 2  MEWS RR 0  MEWS LOC 0  MEWS Score 2  MEWS Score Color Yellow  Assess: if the MEWS score is Yellow or Red  Were vital signs taken at a resting state? Yes  Focused Assessment No change from prior assessment  Does the patient meet 2 or more of the SIRS criteria? No  MEWS guidelines implemented *See Row Information* Yes  Treat  Pain Scale 0-10  Pain Score 3  Pain Type Acute pain  Pain Location Arm  Pain Orientation Right  Pain Descriptors / Indicators Aching  Pain Intervention(s) Refused  Take Vital Signs  Increase Vital Sign Frequency  Yellow: Q 2hr X 2 then Q 4hr X 2, if remains yellow, continue Q 4hrs  Escalate  MEWS: Escalate Yellow: discuss with charge nurse/RN and consider discussing with provider and RRT  Notify: Charge Nurse/RN  Name of Charge Nurse/RN Notified Alcario Drought, RN  Date Charge Nurse/RN Notified 05/05/21  Time Charge Nurse/RN Notified 1732  Document  Patient Outcome Stabilized after interventions (Patient just arrived from ED. MD aware.)  Progress note created (see row info) Yes  Assess: SIRS CRITERIA  SIRS Temperature  0  SIRS Pulse 1  SIRS Respirations  0  SIRS WBC 0  SIRS Score Sum  1

## 2021-05-06 DIAGNOSIS — Z8639 Personal history of other endocrine, nutritional and metabolic disease: Secondary | ICD-10-CM

## 2021-05-06 DIAGNOSIS — E875 Hyperkalemia: Secondary | ICD-10-CM | POA: Diagnosis present

## 2021-05-06 DIAGNOSIS — J189 Pneumonia, unspecified organism: Secondary | ICD-10-CM | POA: Diagnosis not present

## 2021-05-06 LAB — GLUCOSE, CAPILLARY
Glucose-Capillary: 107 mg/dL — ABNORMAL HIGH (ref 70–99)
Glucose-Capillary: 138 mg/dL — ABNORMAL HIGH (ref 70–99)
Glucose-Capillary: 151 mg/dL — ABNORMAL HIGH (ref 70–99)
Glucose-Capillary: 158 mg/dL — ABNORMAL HIGH (ref 70–99)
Glucose-Capillary: 162 mg/dL — ABNORMAL HIGH (ref 70–99)
Glucose-Capillary: 172 mg/dL — ABNORMAL HIGH (ref 70–99)
Glucose-Capillary: 175 mg/dL — ABNORMAL HIGH (ref 70–99)
Glucose-Capillary: 215 mg/dL — ABNORMAL HIGH (ref 70–99)
Glucose-Capillary: 289 mg/dL — ABNORMAL HIGH (ref 70–99)
Glucose-Capillary: 328 mg/dL — ABNORMAL HIGH (ref 70–99)
Glucose-Capillary: 363 mg/dL — ABNORMAL HIGH (ref 70–99)
Glucose-Capillary: 49 mg/dL — ABNORMAL LOW (ref 70–99)
Glucose-Capillary: 67 mg/dL — ABNORMAL LOW (ref 70–99)
Glucose-Capillary: 73 mg/dL (ref 70–99)
Glucose-Capillary: 91 mg/dL (ref 70–99)
Glucose-Capillary: 91 mg/dL (ref 70–99)
Glucose-Capillary: 95 mg/dL (ref 70–99)
Glucose-Capillary: 97 mg/dL (ref 70–99)
Glucose-Capillary: <10 mg/dL — CL (ref 70–99)

## 2021-05-06 LAB — COMPREHENSIVE METABOLIC PANEL
ALT: 11 U/L (ref 0–44)
AST: 19 U/L (ref 15–41)
Albumin: 3.4 g/dL — ABNORMAL LOW (ref 3.5–5.0)
Alkaline Phosphatase: 48 U/L (ref 38–126)
Anion gap: 7 (ref 5–15)
BUN: 27 mg/dL — ABNORMAL HIGH (ref 6–20)
CO2: 28 mmol/L (ref 22–32)
Calcium: 8.7 mg/dL — ABNORMAL LOW (ref 8.9–10.3)
Chloride: 105 mmol/L (ref 98–111)
Creatinine, Ser: 1.37 mg/dL — ABNORMAL HIGH (ref 0.44–1.00)
GFR, Estimated: 54 mL/min — ABNORMAL LOW (ref >60)
Glucose, Bld: 37 mg/dL — CL (ref 70–99)
Potassium: 4.8 mmol/L (ref 3.5–5.1)
Sodium: 140 mmol/L (ref 135–145)
Total Bilirubin: 0.5 mg/dL (ref 0.3–1.2)
Total Protein: 6 g/dL — ABNORMAL LOW (ref 6.5–8.1)

## 2021-05-06 LAB — CBC
HCT: 24.1 % — ABNORMAL LOW (ref 36.0–46.0)
Hemoglobin: 7.8 g/dL — ABNORMAL LOW (ref 12.0–15.0)
MCH: 29.7 pg (ref 26.0–34.0)
MCHC: 32.4 g/dL (ref 30.0–36.0)
MCV: 91.6 fL (ref 80.0–100.0)
Platelets: 155 10*3/uL (ref 150–400)
RBC: 2.63 MIL/uL — ABNORMAL LOW (ref 3.87–5.11)
RDW: 13.6 % (ref 11.5–15.5)
WBC: 4.9 10*3/uL (ref 4.0–10.5)
nRBC: 0 % (ref 0.0–0.2)

## 2021-05-06 LAB — BASIC METABOLIC PANEL
Anion gap: 6 (ref 5–15)
BUN: 29 mg/dL — ABNORMAL HIGH (ref 6–20)
CO2: 22 mmol/L (ref 22–32)
Calcium: 7.9 mg/dL — ABNORMAL LOW (ref 8.9–10.3)
Chloride: 106 mmol/L (ref 98–111)
Creatinine, Ser: 1.59 mg/dL — ABNORMAL HIGH (ref 0.44–1.00)
GFR, Estimated: 45 mL/min — ABNORMAL LOW (ref >60)
Glucose, Bld: 372 mg/dL — ABNORMAL HIGH (ref 70–99)
Potassium: 6.8 mmol/L (ref 3.5–5.1)
Sodium: 134 mmol/L — ABNORMAL LOW (ref 135–145)

## 2021-05-06 LAB — PROTIME-INR
INR: 1.4 — ABNORMAL HIGH (ref 0.8–1.2)
Prothrombin Time: 17.2 seconds — ABNORMAL HIGH (ref 11.4–15.2)

## 2021-05-06 LAB — CORTISOL-AM, BLOOD: Cortisol - AM: 11.6 ug/dL (ref 6.7–22.6)

## 2021-05-06 LAB — POTASSIUM: Potassium: 4.4 mmol/L (ref 3.5–5.1)

## 2021-05-06 LAB — PROCALCITONIN: Procalcitonin: 0.53 ng/mL

## 2021-05-06 MED ORDER — DEXTROSE 50 % IV SOLN
1.0000 | INTRAVENOUS | Status: AC | PRN
  Administered 2021-05-06: 50 mL via INTRAVENOUS
  Filled 2021-05-06: qty 50

## 2021-05-06 MED ORDER — INSULIN ASPART 100 UNIT/ML IV SOLN: 5.0000 [IU] | Freq: Once | INTRAVENOUS | Status: DC

## 2021-05-06 MED ORDER — CHLORHEXIDINE GLUCONATE CLOTH 2 % EX PADS
6.0000 | MEDICATED_PAD | Freq: Every day | CUTANEOUS | Status: DC
  Administered 2021-05-06 – 2021-05-10 (×5): 6 via TOPICAL

## 2021-05-06 MED ORDER — DEXTROSE 50 % IV SOLN
INTRAVENOUS | Status: AC
  Filled 2021-05-06: qty 50

## 2021-05-06 MED ORDER — LOPERAMIDE HCL 2 MG PO CAPS
4.0000 mg | ORAL_CAPSULE | ORAL | Status: DC | PRN
  Administered 2021-05-06: 05:00:00 4 mg via ORAL
  Filled 2021-05-06: qty 2

## 2021-05-06 MED ORDER — DEXTROSE 50 % IV SOLN
12.5000 g | INTRAVENOUS | Status: AC
  Administered 2021-05-06: 16:00:00 12.5 g via INTRAVENOUS

## 2021-05-06 MED ORDER — METOPROLOL TARTRATE 5 MG/5ML IV SOLN: 2.5000 mg | INTRAVENOUS | Status: DC | PRN

## 2021-05-06 MED ORDER — DEXTROSE 50 % IV SOLN: 1.0000 | Freq: Once | INTRAVENOUS | Status: DC

## 2021-05-06 MED ORDER — INSULIN ASPART 100 UNIT/ML IJ SOLN
0.0000 [IU] | Freq: Three times a day (TID) | INTRAMUSCULAR | Status: DC
  Administered 2021-05-06 – 2021-05-07 (×2): 1 [IU] via SUBCUTANEOUS
  Filled 2021-05-06 (×2): qty 1

## 2021-05-06 MED ORDER — INSULIN ASPART 100 UNIT/ML IV SOLN
10.0000 [IU] | Freq: Once | INTRAVENOUS | Status: DC
  Filled 2021-05-06: qty 0.1

## 2021-05-06 MED ORDER — CALCIUM GLUCONATE-NACL 1-0.675 GM/50ML-% IV SOLN
1.0000 g | Freq: Once | INTRAVENOUS | Status: AC
  Administered 2021-05-06: 09:00:00 1000 mg via INTRAVENOUS
  Filled 2021-05-06: qty 50

## 2021-05-06 MED ORDER — DEXTROSE 10 % IV SOLN: INTRAVENOUS | Status: DC

## 2021-05-06 MED ORDER — SODIUM BICARBONATE 8.4 % IV SOLN
Freq: Once | INTRAVENOUS | Status: AC
  Filled 2021-05-06: qty 150

## 2021-05-06 MED ORDER — INSULIN ASPART 100 UNIT/ML IV SOLN
5.0000 [IU] | Freq: Once | INTRAVENOUS | Status: AC
  Administered 2021-05-06: 10:00:00 5 [IU] via INTRAVENOUS
  Filled 2021-05-06: qty 0.05

## 2021-05-06 MED ORDER — LEVOTHYROXINE SODIUM 25 MCG PO TABS
25.0000 ug | ORAL_TABLET | Freq: Every day | ORAL | Status: DC
  Administered 2021-05-06 – 2021-05-10 (×5): 25 ug via ORAL
  Filled 2021-05-06 (×5): qty 1

## 2021-05-06 MED ORDER — SODIUM ZIRCONIUM CYCLOSILICATE 10 G PO PACK
10.0000 g | PACK | Freq: Once | ORAL | Status: AC
  Administered 2021-05-06: 10:00:00 10 g via ORAL
  Filled 2021-05-06 (×2): qty 1

## 2021-05-06 MED ORDER — DEXTROSE 50 % IV SOLN
25.0000 g | INTRAVENOUS | Status: AC
  Administered 2021-05-06: 13:00:00 25 g via INTRAVENOUS
  Filled 2021-05-06: qty 50

## 2021-05-06 MED ORDER — INSULIN GLARGINE-YFGN 100 UNIT/ML ~~LOC~~ SOLN
5.0000 [IU] | Freq: Every day | SUBCUTANEOUS | Status: DC
  Administered 2021-05-06: 12:00:00 5 [IU] via SUBCUTANEOUS
  Filled 2021-05-06 (×2): qty 0.05

## 2021-05-06 NOTE — Progress Notes (Signed)
Patient alerts staff that she is feeling shaky. Staff obtains blood glucose of 14 on the meter and gives the patient 15grams of carbs to consume as she remains alert. Approximatley 0005, this nurse is at the bedside to asses the patient. Upon assessing the patient and obtaining a repeat CBG of less than 10, hypoglycemic protocol is initiated immediately with pharmalogical interventions and the patient responded will with a blood glucose of 175 at 0029. Vitals remained WNL, patient remained oriented and able to follow simple commands

## 2021-05-06 NOTE — Progress Notes (Signed)
°   05/06/21 6269  Notify: Provider  Provider Name/Title Esaw Grandchild DO  Date Provider Notified 05/06/21  Time Provider Notified [phone]  Notification Type Page  Notification Reason Critical result  Provider response See new orders  Date of Provider Response 05/06/21  Time of Provider Response [phone]

## 2021-05-06 NOTE — Assessment & Plan Note (Signed)
Present on admission with K6.0>> 6.8 on repeat labs this morning. -- IV insulin and D50 ordered -- Bicarb drip --Lokelma ordered but may not tolerate due to nausea vomiting --Repeat potassium this afternoon improved 4.4 -- Monitor BMP and treat as needed

## 2021-05-06 NOTE — Progress Notes (Signed)
Inpatient Diabetes Program Recommendations  AACE/ADA: New Consensus Statement on Inpatient Glycemic Control (2015)  Target Ranges:  Prepandial:   less than 140 mg/dL      Peak postprandial:   less than 180 mg/dL (1-2 hours)      Critically ill patients:  140 - 180 mg/dL   Lab Results  Component Value Date   GLUCAP 328 (H) 05/06/2021   HGBA1C 6.6 (H) 04/27/2021    Review of Glycemic Control  Latest Reference Range & Units 05/05/21 23:52 05/06/21 00:05 05/06/21 00:29 05/06/21 00:52 05/06/21 07:31 05/06/21 08:39 05/06/21 09:21  Glucose-Capillary 70 - 99 mg/dL 14 (LL) <51 (LL) 025 (H) 162 (H) 289 (H) 363 (H) 328 (H)   Diabetes history: Type 1 DM- very sensitive to insulin Outpatient Diabetes medications: T slim insulin pump Basal 12A 0.2 units/hour 6A   0.3 units/hour Total Basal: 6.6 units per 24 hours Correction Factor 1:80 mg/dl (1 unit drops glucose 80 mg/dl) Carb Ratio 8:52 grams (1 unit covers 20 grams of carbs) Target glucose 110 mg/dl  Current orders for Inpatient glycemic control:  Novolog sensitive q 4 hours  Inpatient Diabetes Program Recommendations:    Please add Semglee 5 units daily (basal insulin) and reduce Novolog to very sensitive (0-6 units) q 4 hours.  Recommend checking blood sugars every 1-2 hours as 1 unit of insulin drops patient's blood sugars approximately 80 mg/dL.  May need dextrose added to IV fluids if patient does not eat.   Thanks,  Beryl Meager, RN, BC-ADM Inpatient Diabetes Coordinator Pager [phone]  (8a-5p)

## 2021-05-06 NOTE — Progress Notes (Signed)
Report given to [facility], patient going to ICU Rm#2.  Patient transferred via bed, transfer uneventful.

## 2021-05-06 NOTE — Progress Notes (Signed)
°   05/06/21 0451  Assess: MEWS Score  Temp (!) 103.1 F (39.5 C)  BP 114/80  Pulse Rate (!) 114  SpO2 98 %  Assess: MEWS Score  MEWS Temp 2  MEWS Systolic 0  MEWS Pulse 2  MEWS RR 0  MEWS LOC 0  MEWS Score 4  MEWS Score Color Red  Assess: if the MEWS score is Yellow or Red  Were vital signs taken at a resting state? Yes  Focused Assessment Change from prior assessment (see assessment flowsheet)  Does the patient meet 2 or more of the SIRS criteria? Yes  Does the patient have a confirmed or suspected source of infection? No  Provider and Rapid Response Notified? No  MEWS guidelines implemented *See Row Information* Yes  Treat  MEWS Interventions Administered prn meds/treatments  Pain Scale 0-10  Pain Score 0  Take Vital Signs  Increase Vital Sign Frequency  Red: Q 1hr X 4 then Q 4hr X 4, if remains red, continue Q 4hrs  Escalate  MEWS: Escalate Red: discuss with charge nurse/RN and provider, consider discussing with RRT  Notify: Charge Nurse/RN  Name of Charge Nurse/RN Notified Katrina Stack  Date Charge Nurse/RN Notified 05/06/21  Time Charge Nurse/RN Notified 0502  Notify: Provider  Provider Name/Title Dr.Amy Cox`  Date Provider Notified 05/06/21  Time Provider Notified 0503  Notification Type Page  Notification Reason Change in status  Provider response No new orders  Document  Patient Outcome Not stable and remains on department  Progress note created (see row info) Yes  Assess: SIRS CRITERIA  SIRS Temperature  1  SIRS Pulse 1  SIRS Respirations  0  SIRS WBC 0  SIRS Score Sum  2

## 2021-05-06 NOTE — Progress Notes (Signed)
°   05/06/21 0451  Assess: MEWS Score  Temp (!) 103.1 F (39.5 C)  BP 114/80  Pulse Rate (!) 114  SpO2 98 %  Assess: MEWS Score  MEWS Temp 2  MEWS Systolic 0  MEWS Pulse 2  MEWS RR 0  MEWS LOC 0  MEWS Score 4  MEWS Score Color Red  Assess: if the MEWS score is Yellow or Red  Were vital signs taken at a resting state? Yes  Focused Assessment Change from prior assessment (see assessment flowsheet)  Does the patient meet 2 or more of the SIRS criteria? Yes  Does the patient have a confirmed or suspected source of infection? No  Provider and Rapid Response Notified? No  MEWS guidelines implemented *See Row Information* Yes  Treat  MEWS Interventions Administered prn meds/treatments  Pain Scale 0-10  Pain Score 0  Take Vital Signs  Increase Vital Sign Frequency  Red: Q 1hr X 4 then Q 4hr X 4, if remains red, continue Q 4hrs  Escalate  MEWS: Escalate Red: discuss with charge nurse/RN and provider, consider discussing with RRT  Notify: Charge Nurse/RN  Name of Charge Nurse/RN Notified Katrina Stack  Date Charge Nurse/RN Notified 05/06/21  Time Charge Nurse/RN Notified 0502  Notify: Provider  Provider Name/Title Dr.Amy Cox`  Date Provider Notified 05/06/21  Time Provider Notified 0503  Notification Type Page  Notification Reason Change in status  Provider response No new orders  Assess: SIRS CRITERIA  SIRS Temperature  1  SIRS Pulse 1  SIRS Respirations  0  SIRS WBC 0  SIRS Score Sum  2

## 2021-05-06 NOTE — Progress Notes (Signed)
Briefly spoke with patient at bedside.  She states she is still feeling poorly.  Patient has insulin pump/Dexcom sensor at home.  I asked patient if Dexcom helps with alerting her of lows.  She states "yes" but that it "ran out" while she was sleeping the night before.  Reminded patient of the importance of the sensor to help her identify low blood sugars, etc.  She has a newborn baby at home.  Asked if she could have her husband bring her insulin pump and sensor to the hospital so that we can potentially restart it tomorrow and she states "no".   Patient is very sensitive to insulin doses and to the omission of insulin due to hx. Of Type 1 DM.  She also has history of adrenal insufficiency which could potentially increase risk of hypoglycemia?? Discussed with RN.  Will follow.   Thanks,  Beryl Meager, RN, BC-ADM Inpatient Diabetes Coordinator Pager [phone]  (8a-5p)

## 2021-05-06 NOTE — Hospital Course (Addendum)
Ashley Camacho is a 30 y.o. Caucasian female with medical history significant for type 1 diabetes mellitus on insulin pump, hypertension diabetic gastroparesis and recent observation here for hypoglycemia on 12/5 that were thought be related to taking Lantus as she was not able to use her insulin pump due to lack of insulin.  She presented to the ED on 05/05/2021 with recurrent vomiting over the last couple of days. Noted to be hypoglycemic by EMS with blood glucose of 58, given an amp of D50.  Glucose later dropped in the ER was given another amp of D50.  Her  insulin pump was stopped.  She denies taking any long-acting insulin.  Her only other complaint on admission was dry cough recently, no fever, chills, SOB or wheezing.    Evaluation in the ED - chest xray concerning for RML opacity concerning for pneumonia, started on empiric IV antibiotics.  Labs showed hyperkalemia with K 6.0.  Started on IV fluids, D10w infusion and admitted to hospitalist service for further evaluation and management as outlined below.

## 2021-05-06 NOTE — Progress Notes (Signed)
°   05/06/21 4492  Provider Notification  Provider Name/Title Esaw Grandchild DO  Date Provider Notified 05/06/21  Time Provider Notified [phone]  Notification Type Page  Notification Reason Critical result  Test performed and critical result Potassium 6.8  Date Critical Result Received 05/06/21  Time Critical Result Received [phone]

## 2021-05-06 NOTE — Assessment & Plan Note (Signed)
Presented with dry cough and chest xray showing RML opacity.   -- Continue empiric Rocephin and Zithromax --Monitor oxygenation and supplement O2 if SPO2 below 90% on room air -- Supportive care

## 2021-05-06 NOTE — Assessment & Plan Note (Signed)
With recent nausea vomiting likely contributing to her hypoglycemia. -- IV Reglan every 6 hours -- Zofran as needed

## 2021-05-06 NOTE — Progress Notes (Signed)
Treatment for hyperkalemia ongoing, patient is sleeping, easily arousable.  Awaiting bed availability in ICU.  Charge RN aware.

## 2021-05-06 NOTE — Assessment & Plan Note (Signed)
Resumed on levothyroxine 25 mcg daily.  Patient follows with endocrinology at [facility].  Recent TSH mildly elevated 4.991 and free T4 normal at 5 on 04/27/2021

## 2021-05-06 NOTE — Plan of Care (Signed)
Patient arrived to ICU for hypoglycemia, nausea and vomiting.  Gave zofran for nausea.

## 2021-05-06 NOTE — Assessment & Plan Note (Signed)
Continue gabapentin.

## 2021-05-06 NOTE — Progress Notes (Signed)
Patient has been arousable, responsive and following commands.  Patient refuses to eat at this time due to "I throw it up all the time".  Reglan scheduled given.  K=6.8, Dr. Denton Lank made aware via [facility] text message.  Updated MD on pt's condition this am as well.  BG has been highly fluctuating requiring at least q1 hour BG check.  No distress at this time.  Awaiting MD response.

## 2021-05-06 NOTE — Assessment & Plan Note (Signed)
Patient is very brittle diabetic, extremely sensitive to insulin.  Uses insulin pump at home but recently her sensor stopped working.  She is having recurrent and refractory hypoglycemic episodes despite IV dextrose being given.  Possibly secondary to adrenal insufficiency but her a.m. level this morning was normal. -- Diabetes coordinator following, appreciate input -- Continue D10 infusion, increased to 50 cc/h -- Hypoglycemia protocol -- Close monitoring in stepdown unit with CBGs every hour -- Current insulin orders are Semglee 5 units daily-very sensitive sliding scale NovoLog

## 2021-05-06 NOTE — Assessment & Plan Note (Signed)
Possibly contributing to her hyperkalemia and recurrent hypoglycemic episodes. Per chart review, patient has been followed by endocrinologist, Dr. Amalia Hailey, at [facility].  Last seen for follow-up of type 1 diabetes and adrenal insufficiency 04/02/2021.  Adrenal insufficiency appears to have occurred during long complicated admission during pregnancy.  Refer to care everywhere 04/02/2021 endocrinology encounter for more details. Patient had not been taking Cortef as of last follow-up. -- A.m. cortisol today within normal limits -- Repeat a.m. cortisol tomorrow along with ACTH level -- Hold off steroids for now

## 2021-05-06 NOTE — Progress Notes (Signed)
Progress Note    Ashley Camacho   TIR:[id number]  DOB: 05-Jul-1990  DOA: 05/05/2021     1 Date of Service: 05/06/2021   Brief narrative Ashley Camacho is a 30 y.o. Caucasian female with medical history significant for type 1 diabetes mellitus on insulin pump, hypertension diabetic gastroparesis and recent observation here for hypoglycemia on 12/5 that were thought be related to taking Lantus as she was not able to use her insulin pump due to lack of insulin.  She presented to the ED on 05/05/2021 with recurrent vomiting over the last couple of days. Noted to be hypoglycemic by EMS with blood glucose of 58, given an amp of D50.  Glucose later dropped in the ER was given another amp of D50.  Her  insulin pump was stopped.  She denies taking any long-acting insulin.  Her only other complaint on admission was dry cough recently, no fever, chills, SOB or wheezing.    Evaluation in the ED - chest xray concerning for RML opacity concerning for pneumonia, started on empiric IV antibiotics.  Labs showed hyperkalemia with K 6.0.  Started on IV fluids, D10w infusion and admitted to hospitalist service for further evaluation and management as outlined below.     Assessment and Plan * CAP (community acquired pneumonia) Presented with dry cough and chest xray showing RML opacity.   -- Continue empiric Rocephin and Zithromax --Monitor oxygenation and supplement O2 if SPO2 below 90% on room air -- Supportive care  Type 1 diabetes mellitus with hypoglycemia without coma (HCC) Patient is very brittle diabetic, extremely sensitive to insulin.  Uses insulin pump at home but recently her sensor stopped working.  She is having recurrent and refractory hypoglycemic episodes despite IV dextrose being given.  Possibly secondary to adrenal insufficiency but her a.m. level this morning was normal. -- Diabetes coordinator following, appreciate input -- Continue D10 infusion, increased to 50 cc/h -- Hypoglycemia  protocol -- Close monitoring in stepdown unit with CBGs every hour -- Current insulin orders are Semglee 5 units daily-very sensitive sliding scale NovoLog  History of adrenal insufficiency Possibly contributing to her hyperkalemia and recurrent hypoglycemic episodes. Per chart review, patient has been followed by endocrinologist, Dr. Amalia Hailey, at [facility].  Last seen for follow-up of type 1 diabetes and adrenal insufficiency 04/02/2021.  Adrenal insufficiency appears to have occurred during long complicated admission during pregnancy.  Refer to care everywhere 04/02/2021 endocrinology encounter for more details. Patient had not been taking Cortef as of last follow-up. -- A.m. cortisol today within normal limits -- Repeat a.m. cortisol tomorrow along with ACTH level -- Hold off steroids for now  Hyperkalemia Present on admission with K6.0>> 6.8 on repeat labs this morning. -- IV insulin and D50 ordered -- Bicarb drip --Lokelma ordered but may not tolerate due to nausea vomiting --Repeat potassium this afternoon improved 4.4 -- Monitor BMP and treat as needed  Diabetic gastroparesis (HCC) With recent nausea vomiting likely contributing to her hypoglycemia. -- IV Reglan every 6 hours -- Zofran as needed  Peripheral neuropathy Continue gabapentin     Subjective:  Patient somnolent and only minimally responsive when seen on rounds today.  She is arousable and will answer yes/no questions but otherwise does not wake up to interact.  Bedside RN reports nausea vomiting this morning, unlikely to tolerate oral medication including Lokelma.  Transferred to stepdown unit today for very close monitoring of refractory hypoglycemia.  Objective Vitals:   05/06/21 1226 05/06/21 1307 05/06/21 1400 05/06/21 1700  BP: 103/66 111/78 94/67 106/74  Pulse: 93 99 96 98  Resp:  17 17 13   Temp: 99 F (37.2 C) 99 F (37.2 C)  98.6 F (37 C)  TempSrc: Oral Oral  Oral  SpO2: 98% 100% 100% 100%  Weight:   52.2 kg     52.2 kg  Vital signs were reviewed and unremarkable except for: Soft but stable blood pressures   Exam General exam: Somnolent, arousable to voice briefly, no acute distress Respiratory system: CTAB, no wheezes, rales or rhonchi, normal respiratory effort. Cardiovascular system: normal S1/S2, RRR, 2+ radial pulses bilaterally.   Gastrointestinal system: soft, NT, ND, no HSM felt, +bowel sounds. Central nervous system: Unable to evaluate at this time as patient somnolent and not following commands.  Responds appropriately to voice.  No gross focal deficits Extremities: Distal extremities are warm with palpable pulses, no edema, normal tone Skin: dry, intact, normal temperature    Labs / Other Information My review of labs, imaging, notes and other tests is significant for Recurrent hypoglycemia.  Early morning potassium 6.8 improved to 4.4 after treatment today.  Increased from 1.37-1.59, hemoglobin 7.8 this is down from 9.1 (likely dilutional).   Disposition Plan: Status is: Inpatient  Remains inpatient appropriate because: Refractory hypoglycemic episodes requiring close monitoring in stepdown unit and IV therapies as outlined above        Time spent: 45 minutes with greater than 50% spent at bedside and coronation of care [facility] 05/06/2021, 5:30 PM

## 2021-05-07 ENCOUNTER — Other Ambulatory Visit: Payer: Self-pay

## 2021-05-07 ENCOUNTER — Inpatient Hospital Stay: Payer: Self-pay

## 2021-05-07 ENCOUNTER — Encounter: Payer: Self-pay | Admitting: Family Medicine

## 2021-05-07 DIAGNOSIS — Z8639 Personal history of other endocrine, nutritional and metabolic disease: Secondary | ICD-10-CM

## 2021-05-07 DIAGNOSIS — E10649 Type 1 diabetes mellitus with hypoglycemia without coma: Secondary | ICD-10-CM

## 2021-05-07 DIAGNOSIS — F331 Major depressive disorder, recurrent, moderate: Secondary | ICD-10-CM | POA: Diagnosis not present

## 2021-05-07 DIAGNOSIS — E1042 Type 1 diabetes mellitus with diabetic polyneuropathy: Secondary | ICD-10-CM

## 2021-05-07 DIAGNOSIS — E1043 Type 1 diabetes mellitus with diabetic autonomic (poly)neuropathy: Secondary | ICD-10-CM

## 2021-05-07 DIAGNOSIS — J189 Pneumonia, unspecified organism: Secondary | ICD-10-CM | POA: Diagnosis not present

## 2021-05-07 DIAGNOSIS — E039 Hypothyroidism, unspecified: Secondary | ICD-10-CM

## 2021-05-07 DIAGNOSIS — R21 Rash and other nonspecific skin eruption: Secondary | ICD-10-CM

## 2021-05-07 DIAGNOSIS — M79601 Pain in right arm: Secondary | ICD-10-CM | POA: Diagnosis present

## 2021-05-07 LAB — CORTISOL: Cortisol, Plasma: 6.3 ug/dL

## 2021-05-07 LAB — COMPREHENSIVE METABOLIC PANEL
ALT: 9 U/L (ref 0–44)
AST: 16 U/L (ref 15–41)
Albumin: 2.8 g/dL — ABNORMAL LOW (ref 3.5–5.0)
Alkaline Phosphatase: 42 U/L (ref 38–126)
Anion gap: 7 (ref 5–15)
BUN: 21 mg/dL — ABNORMAL HIGH (ref 6–20)
CO2: 28 mmol/L (ref 22–32)
Calcium: 7.9 mg/dL — ABNORMAL LOW (ref 8.9–10.3)
Chloride: 101 mmol/L (ref 98–111)
Creatinine, Ser: 1.4 mg/dL — ABNORMAL HIGH (ref 0.44–1.00)
GFR, Estimated: 52 mL/min — ABNORMAL LOW (ref >60)
Glucose, Bld: 95 mg/dL (ref 70–99)
Potassium: 4.4 mmol/L (ref 3.5–5.1)
Sodium: 136 mmol/L (ref 135–145)
Total Bilirubin: 0.5 mg/dL (ref 0.3–1.2)
Total Protein: 5.6 g/dL — ABNORMAL LOW (ref 6.5–8.1)

## 2021-05-07 LAB — CBC
HCT: 22 % — ABNORMAL LOW (ref 36.0–46.0)
Hemoglobin: 7.5 g/dL — ABNORMAL LOW (ref 12.0–15.0)
MCH: 30.9 pg (ref 26.0–34.0)
MCHC: 34.1 g/dL (ref 30.0–36.0)
MCV: 90.5 fL (ref 80.0–100.0)
Platelets: 139 10*3/uL — ABNORMAL LOW (ref 150–400)
RBC: 2.43 MIL/uL — ABNORMAL LOW (ref 3.87–5.11)
RDW: 13.2 % (ref 11.5–15.5)
WBC: 3.7 10*3/uL — ABNORMAL LOW (ref 4.0–10.5)
nRBC: 0 % (ref 0.0–0.2)

## 2021-05-07 LAB — GLUCOSE, CAPILLARY
Glucose-Capillary: 110 mg/dL — ABNORMAL HIGH (ref 70–99)
Glucose-Capillary: 117 mg/dL — ABNORMAL HIGH (ref 70–99)
Glucose-Capillary: 121 mg/dL — ABNORMAL HIGH (ref 70–99)
Glucose-Capillary: 128 mg/dL — ABNORMAL HIGH (ref 70–99)
Glucose-Capillary: 128 mg/dL — ABNORMAL HIGH (ref 70–99)
Glucose-Capillary: 150 mg/dL — ABNORMAL HIGH (ref 70–99)
Glucose-Capillary: 152 mg/dL — ABNORMAL HIGH (ref 70–99)
Glucose-Capillary: 157 mg/dL — ABNORMAL HIGH (ref 70–99)
Glucose-Capillary: 200 mg/dL — ABNORMAL HIGH (ref 70–99)
Glucose-Capillary: 221 mg/dL — ABNORMAL HIGH (ref 70–99)
Glucose-Capillary: 64 mg/dL — ABNORMAL LOW (ref 70–99)
Glucose-Capillary: 73 mg/dL (ref 70–99)
Glucose-Capillary: 90 mg/dL (ref 70–99)
Glucose-Capillary: 91 mg/dL (ref 70–99)

## 2021-05-07 LAB — PHOSPHORUS: Phosphorus: 2.9 mg/dL (ref 2.5–4.6)

## 2021-05-07 LAB — PROCALCITONIN: Procalcitonin: 0.52 ng/mL

## 2021-05-07 LAB — MAGNESIUM: Magnesium: 1.7 mg/dL (ref 1.7–2.4)

## 2021-05-07 MED ORDER — NEPRO/CARBSTEADY PO LIQD
237.0000 mL | Freq: Three times a day (TID) | ORAL | Status: DC
  Administered 2021-05-07: 237 mL via ORAL

## 2021-05-07 MED ORDER — INSULIN GLARGINE-YFGN 100 UNIT/ML ~~LOC~~ SOLN
3.0000 [IU] | Freq: Every day | SUBCUTANEOUS | Status: DC
  Filled 2021-05-07: qty 0.03

## 2021-05-07 MED ORDER — SODIUM CHLORIDE 0.9% FLUSH
10.0000 mL | Freq: Two times a day (BID) | INTRAVENOUS | Status: DC
  Administered 2021-05-07 – 2021-05-10 (×6): 10 mL

## 2021-05-07 MED ORDER — ADULT MULTIVITAMIN W/MINERALS CH
1.0000 | ORAL_TABLET | Freq: Every day | ORAL | Status: DC
  Administered 2021-05-08 – 2021-05-10 (×3): 1 via ORAL
  Filled 2021-05-07 (×3): qty 1

## 2021-05-07 MED ORDER — SODIUM CHLORIDE 0.9% FLUSH: 10.0000 mL | INTRAVENOUS | Status: DC | PRN

## 2021-05-07 MED ORDER — HYDROCORTISONE SOD SUC (PF) 100 MG IJ SOLR
50.0000 mg | Freq: Three times a day (TID) | INTRAMUSCULAR | Status: DC
  Administered 2021-05-07 – 2021-05-09 (×6): 50 mg via INTRAVENOUS
  Filled 2021-05-07 (×6): qty 2

## 2021-05-07 MED ORDER — DEXTROSE 50 % IV SOLN
12.5000 g | Freq: Once | INTRAVENOUS | Status: AC
  Administered 2021-05-07: 12.5 g via INTRAVENOUS

## 2021-05-07 MED ORDER — HYDROCORTISONE SOD SUC (PF) 100 MG IJ SOLR
50.0000 mg | Freq: Three times a day (TID) | INTRAMUSCULAR | Status: DC
Start: 2021-05-07 — End: 2021-05-07

## 2021-05-07 MED ORDER — MORPHINE SULFATE (PF) 2 MG/ML IV SOLN
1.0000 mg | INTRAVENOUS | Status: DC | PRN
  Administered 2021-05-07 – 2021-05-08 (×5): 1 mg via INTRAVENOUS
  Filled 2021-05-07 (×5): qty 1

## 2021-05-07 MED ORDER — DEXTROSE 50 % IV SOLN
INTRAVENOUS | Status: AC
  Filled 2021-05-07: qty 50

## 2021-05-07 MED ORDER — HYDROCORTISONE SOD SUC (PF) 100 MG IJ SOLR
100.0000 mg | Freq: Once | INTRAMUSCULAR | Status: AC
  Administered 2021-05-07: 100 mg via INTRAVENOUS
  Filled 2021-05-07: qty 2

## 2021-05-07 MED ORDER — INSULIN ASPART 100 UNIT/ML IJ SOLN
0.0000 [IU] | INTRAMUSCULAR | Status: DC
  Administered 2021-05-07: 2 [IU] via SUBCUTANEOUS
  Administered 2021-05-07: 1 [IU] via SUBCUTANEOUS
  Administered 2021-05-08: 5 [IU] via SUBCUTANEOUS
  Administered 2021-05-08: 2 [IU] via SUBCUTANEOUS
  Administered 2021-05-08: 3 [IU] via SUBCUTANEOUS
  Administered 2021-05-08: 4 [IU] via SUBCUTANEOUS
  Administered 2021-05-09: 3 [IU] via SUBCUTANEOUS
  Administered 2021-05-09: 1 [IU] via SUBCUTANEOUS
  Administered 2021-05-09: 2 [IU] via SUBCUTANEOUS
  Administered 2021-05-09 – 2021-05-10 (×3): 1 [IU] via SUBCUTANEOUS
  Administered 2021-05-10: 06:00:00 3 [IU] via SUBCUTANEOUS
  Filled 2021-05-07 (×13): qty 1

## 2021-05-07 MED ORDER — DEXTROSE 5 % IV SOLN
10.0000 mg/kg | Freq: Two times a day (BID) | INTRAVENOUS | Status: DC
  Administered 2021-05-07 (×2): 520 mg via INTRAVENOUS
  Filled 2021-05-07 (×4): qty 10.4

## 2021-05-07 MED ORDER — INSULIN GLARGINE-YFGN 100 UNIT/ML ~~LOC~~ SOLN
5.0000 [IU] | Freq: Every day | SUBCUTANEOUS | Status: DC
  Administered 2021-05-07 – 2021-05-10 (×4): 5 [IU] via SUBCUTANEOUS
  Filled 2021-05-07 (×5): qty 0.05

## 2021-05-07 NOTE — Progress Notes (Signed)
Progress Note    Ashley WAHLER   NWG:[id number]  DOB: [birth year]  DOA: 05/05/2021     2 Date of Service: 05/07/2021   Brief Narrative Ashley Camacho is a 30 y.o. Caucasian female with medical history significant for type 1 diabetes mellitus on insulin pump, hypertension diabetic gastroparesis and recent observation here for hypoglycemia on 12/5 that were thought be related to taking Lantus as she was not able to use her insulin pump due to lack of insulin.  She presented to the ED on 05/05/2021 with recurrent vomiting over the last couple of days. Noted to be hypoglycemic by EMS with blood glucose of 58, given an amp of D50.  Glucose later dropped in the ER was given another amp of D50.  Her  insulin pump was stopped.  She denies taking any long-acting insulin.  Her only other complaint on admission was dry cough recently, no fever, chills, SOB or wheezing.    Evaluation in the ED - chest xray concerning for RML opacity concerning for pneumonia, started on empiric IV antibiotics.  Labs showed hyperkalemia with K 6.0.  Started on IV fluids, D10w infusion and admitted to hospitalist service for further evaluation and management as outlined below.     Assessment and Plan * CAP (community acquired pneumonia) Presented with dry cough and chest xray showing RML opacity.   -- Continue empiric Rocephin and Zithromax --Monitor oxygenation and supplement O2 if SPO2 below 90% on room air -- Supportive care  Rash of hand With right arm pain preceding onset of vesicular appearing rash on right palm, history of varicella in childhood, concerning for Shingles. --Continue acyclovir --Airborne and contact precautions --Swab vesicle fluid for culture and VZV   Right arm pain Pt reports deep aching right arm pain for a couple of weeks prior to admission.  Started on gabapentin which has not helped.  Now with rash on her right palm.  (See rash A&P)  Type 1 diabetes mellitus with hypoglycemia without  coma (HCC) Patient is very brittle diabetic, extremely sensitive to insulin.  Uses insulin pump at home but recently her sensor stopped working.  She is having recurrent and refractory hypoglycemic episodes despite IV dextrose being given.  Possibly secondary to adrenal insufficiency but her a.m. level this morning was normal. -- Diabetes coordinator following, appreciate input -- On D10 infusion, stopped this afternoon with glucose in 200's on steroids -- Hypoglycemia protocol -- Close monitoring in stepdown unit  -- Continue Semglee 5 units daily-very sensitive sliding scale NovoLog q4h --Diabetes coordinator following  History of adrenal insufficiency Possibly contributing to her hyperkalemia and recurrent hypoglycemic episodes. Per chart review, patient has been followed by endocrinologist, Dr. Amalia Hailey, at [facility].  Last seen for follow-up of type 1 diabetes and adrenal insufficiency 04/02/2021.  Adrenal insufficiency appears to have occurred during long complicated admission during pregnancy.  Refer to care everywhere 04/02/2021 endocrinology encounter for more details. Patient had not been taking Cortef as of last follow-up. 12/14 A.m. cortisol within normal limits -- Given persistent hypoglycemia, start stress dose steroids --Call placed to patient's endocrinologist at [facility], awaiting call back  Hyperkalemia Present on admission with K6.0>> 6.8 on 12/14. K normal today 4.4 -- Off bicarb drip -- Monitor BMP and treat as needed  Diabetic gastroparesis (HCC) With recent nausea vomiting likely contributing to her hypoglycemia. Pt continues to have nausea/vomiting, taking in very little PO intake. -- IV Reglan every 6 hours -- Zofran as needed  Peripheral neuropathy Continue gabapentin  Hypothyroidism Resumed on levothyroxine 25 mcg daily.  Patient follows with endocrinology at [facility].  Recent TSH mildly elevated 4.991 and free T4 normal at 5 on 04/27/2021     Subjective:  Pt reports still  having N/V, not tolerating very much PO intake, sipping on liquids.  Has new rash on right palm after having a couple weeks of right arm pain she describes as deep "bone pain", aching.  No rashes or similar elsewhere or on the left side of her body. No other acute complaints.   Objective Vitals:   05/07/21 0400 05/07/21 0800 05/07/21 1200 05/07/21 1400  BP: 95/63 119/78 120/81 124/77  Pulse: 79 88 85 83  Resp: 18 16 17 16   Temp: 99.5 F (37.5 C) 98.9 F (37.2 C) 98.9 F (37.2 C)   TempSrc: Oral Oral Oral   SpO2: 98% 97% 97% 97%  Weight:       52.2 kg  Vital signs were reviewed and unremarkable except for: soft blood pressures, last fever 101.5 at 8pm last night   Exam General exam: awake, alert, no acute distress Respiratory system: CTAB, no wheezes, rales or rhonchi, normal respiratory effort. Cardiovascular system: normal S1/S2, RRR, no JVD, murmurs, rubs, gallops, no pedal edema.   Gastrointestinal system: soft, NT, ND, no HSM felt, +bowel sounds. Central nervous system: A&O x3. no gross focal neurologic deficits, normal speech Extremities: right hand vesicular erythematous rash, no rash or erythema proximally Psychiatry: normal mood, congruent affect, judgement and insight appear normal   Labs / Other Information My review of labs, imaging, notes and other tests is significant for glucose improving in 100's to low 200's since steroids started,   Cr 1.40 improving, albumin 2.8, Ca 7.9. Hbg 7.5, Plt 139k, cortisol 6.3   Disposition Plan: Status is: Inpatient  Remains inpatient appropriate because: severity of illness on IV medications       Time spent: 35 minutes with > 50% spent at bedside and in coordination of care  [facility] 05/07/2021, 6:44 PM

## 2021-05-07 NOTE — Progress Notes (Signed)
Pharmacy Anti-Infective Agent Note  Ashley Camacho is a 30 y.o. female admitted on 05/05/2021 with possible shingles outbreak.  Pharmacy has been consulted for Acyclovir dosing.  Plan: Acyclovir 10 mg/kg (TBW) q12h x 7 days per indication and current renal function.  Pharmacy will continue to follow and will adjust dose and/or transition to oral dose form when appropriate.  Weight: 52.2 kg (115 lb 1.3 oz)  Temp (24hrs), Avg:100.4 F (38 C), Min:98.6 F (37 C), Max:103.1 F (39.5 C)  Recent Labs  Lab 05/05/21 1209 05/05/21 1310 05/05/21 1807 05/05/21 2342 05/06/21 0745  WBC 6.2  --   --   --  4.9  CREATININE 1.50*  --  1.28* 1.37* 1.59*  LATICACIDVEN  --  1.3  --   --   --     Estimated Creatinine Clearance: 39.4 mL/min (A) (by C-G formula based on SCr of 1.59 mg/dL (H)).    No Known Allergies  Anti-Infectives this admission: 12/13 Ceftriaxone >> x 5 days 12/13 Azithromycin >> x 5 days 12/15 Acyclovir >> x 7 days  Microbiology results: 12/13 BCx: NG < 24 hr  Thank you for allowing pharmacy to be a part of this patients care.  Otelia Sergeant, PharmD, [facility] 05/07/2021 3:58 AM

## 2021-05-07 NOTE — Progress Notes (Signed)
Notified B. Jon Billings, NP of BG steadily declining w/ each 1 hour check. CBG-73 now. Refuses oral intake of fluid or food due to continuing nausea despite scheduled antiemetics given. D10 currently infusing @ 50 cc/ hr. New order to increase D10 to 75 cc/hr. Continuing to monitor BG q 1 hour.

## 2021-05-07 NOTE — Assessment & Plan Note (Addendum)
Possibly contributing to her hyperkalemia and recurrent hypoglycemic episodes. Per chart review, patient has been followed by endocrinologist, Dr. Amalia Hailey, at [facility].  Last seen for follow-up of type 1 diabetes and adrenal insufficiency 04/02/2021.  Adrenal insufficiency appears to have occurred during long complicated admission during pregnancy.  Refer to care everywhere 04/02/2021 endocrinology encounter for more details. Patient had not been taking Cortef as of last follow-up. 12/14 A.m. cortisol within normal limits -- Given persistent hypoglycemia, start stress dose steroids --Call placed to patient's endocrinologist at [facility], awaiting call back

## 2021-05-07 NOTE — Consult Note (Signed)
°  Psychiatry consult was requested this afternoon.  Him by to see patient in the intensive care unit.  Unable to see patient because of a procedure being performed.  I will try to see her tomorrow or have our staff people see her.

## 2021-05-07 NOTE — Progress Notes (Signed)
Pharmacy Anti-Infective Agent Note  Ashley Camacho is a 30 y.o. female with PMH of type 1 diabetes mellitus on insulin pump, peripheral neuropathy,  and adrenal insufficiency who was admitted on 05/05/2021 with possible shingles outbreak.  Pharmacy has been consulted for Acyclovir dosing.  Scr 1.37>1.59>1.40  Plan: Acyclovir  --Will continue acyclovir 520mg  (10 mg/kg w/ TBW) q12h x 7 days per indication and current renal function.    Pharmacy will continue to follow and will adjust dose and/or transition to oral dose form when appropriate.  Weight: 52.2 kg (115 lb 1.3 oz)  Temp (24hrs), Avg:99.7 F (37.6 C), Min:98.6 F (37 C), Max:101.5 F (38.6 C)  Recent Labs  Lab 05/05/21 1209 05/05/21 1310 05/05/21 1807 05/05/21 2342 05/06/21 0745 05/07/21 0447  WBC 6.2  --   --   --  4.9 3.7*  CREATININE 1.50*  --  1.28* 1.37* 1.59* 1.40*  LATICACIDVEN  --  1.3  --   --   --   --      Estimated Creatinine Clearance: 44.7 mL/min (A) (by C-G formula based on SCr of 1.4 mg/dL (H)).    No Known Allergies  Anti-Infectives this admission: 12/13 Ceftriaxone >> 12/18 (5 days) 12/13 Azithromycin >> 12/18 (5 days) 12/15 Acyclovir >> 12/22  (7 days)  Microbiology results: 12/13 BCx: NG x2 days 12/13 C difficile screen: sent 12/13 MRSA PCR: sent  Thank you for allowing pharmacy to be a part of this patients care.  1/14, PharmD Pharmacy Resident  05/07/2021 11:45 AM

## 2021-05-07 NOTE — Progress Notes (Signed)
CBG 64. IVF infusing , D10 @ 75 cc/hr.   Also, she is c/o pain in rt elbow/arm/hand that she said she was seen for about a week ago. She said she was started on Gabapentin w/ no relief. Today, in addition to the pain/tingling, she also has developed a small cluster of blisters on the palm of the rt hand. Assessed her arm and hand, and notified B.Jon Billings, NP .

## 2021-05-07 NOTE — Assessment & Plan Note (Addendum)
With recent nausea vomiting likely contributing to her hypoglycemia. Pt continues to have nausea/vomiting, taking in very little PO intake. -- IV Reglan every 6 hours -- Zofran as needed

## 2021-05-07 NOTE — Assessment & Plan Note (Signed)
Resumed on levothyroxine 25 mcg daily.  Patient follows with endocrinology at [facility].  Recent TSH mildly elevated 4.991 and free T4 normal at 5 on 04/27/2021

## 2021-05-07 NOTE — Assessment & Plan Note (Signed)
Presented with dry cough and chest xray showing RML opacity.   -- Continue empiric Rocephin and Zithromax --Monitor oxygenation and supplement O2 if SPO2 below 90% on room air -- Supportive care

## 2021-05-07 NOTE — Progress Notes (Signed)
[employer] [facility] Nurse called with reports of blisters on hand  which was preceeded by one week on pain from right elbow down right lateral hand O  BP 93/60    Pulse 83    Temp 99.2 F (37.3 C) (Oral)    Resp 18    Wt 52.2 kg    LMP  (LMP Unknown)    SpO2 98%    BMI 21.74 kg/m   A 2 cluster areas of fluid filled intact blisters, small in diameter. patient + hx varicella as child - young age for shingles Symptoms typical of shingles but early onset outbreak. Will need to monitor and track area of spread  P give dose of acyclovir and monitor - impetigo also part of differential - keep area clean and dry - contact/airborne precautions. If shingles rules out, start bactroban BID to affected areas

## 2021-05-07 NOTE — Assessment & Plan Note (Signed)
Continue gabapentin.

## 2021-05-07 NOTE — Progress Notes (Signed)
Peripherally Inserted Central Catheter Placement  The IV Nurse has discussed with the patient and/or persons authorized to consent for the patient, the purpose of this procedure and the potential benefits and risks involved with this procedure.  The benefits include less needle sticks, lab draws from the catheter, and the patient may be discharged home with the catheter. Risks include, but not limited to, infection, bleeding, blood clot (thrombus formation), and puncture of an artery; nerve damage and irregular heartbeat and possibility to perform a PICC exchange if needed/ordered by physician.  Alternatives to this procedure were also discussed.  Bard Power PICC patient education guide, fact sheet on infection prevention and patient information card has been provided to patient /or left at bedside.    PICC Placement Documentation  PICC Double Lumen 05/07/21 Left Basilic 41 cm 0 cm (Active)  Indication for Insertion or Continuance of Line Vasoactive infusions 05/07/21 1600  Dressing Change Due 05/14/21 05/07/21 1600    Consent obtained by Devra Dopp RN   Dewain Penning M 05/07/2021, 4:30 PM

## 2021-05-07 NOTE — Assessment & Plan Note (Signed)
Pt reports deep aching right arm pain for a couple of weeks prior to admission.  Started on gabapentin which has not helped.  Now with rash on her right palm.  (See rash A&P)

## 2021-05-07 NOTE — Progress Notes (Addendum)
Inpatient Diabetes Program Recommendations  AACE/ADA: New Consensus Statement on Inpatient Glycemic Control (2015)  Target Ranges:  Prepandial:   less than 140 mg/dL      Peak postprandial:   less than 180 mg/dL (1-2 hours)      Critically ill patients:  140 - 180 mg/dL   Lab Results  Component Value Date   GLUCAP 128 (H) 05/07/2021   HGBA1C 6.6 (H) 04/27/2021    Review of Glycemic Control  Latest Reference Range & Units 05/07/21 03:14 05/07/21 04:05 05/07/21 04:59 05/07/21 06:06 05/07/21 07:22 05/07/21 08:14  Glucose-Capillary 70 - 99 mg/dL 101 (H) 751 (H) 91 90 025 (H) 128 (H)   Diabetes history: DM 1 Outpatient Diabetes medications: T slim insulin pump Basal 12A 0.2 units/hour 6A   0.3 units/hour Total Basal: 6.6 units per 24 hours Correction Factor 1:80 mg/dl (1 unit drops glucose 80 mg/dl) Carb Ratio 8:52 grams (1 unit covers 20 grams of carbs) Target glucose 110 mg/dl   Current orders for Inpatient glycemic control:  Novolog very sensitive (0-6 units) tid with meals Solucortef 50 mg IV q 8 hours  Inpatient Diabetes Program Recommendations:   Note steroids started today.  Consider increasing Novolog to "very sensitive" 0-6 units q 4 hours.  Also patient needs basal insulin restarted due to history of Type 1 DM.    Thanks,  Beryl Meager, RN, BC-ADM Inpatient Diabetes Coordinator Pager [phone]  (8a-5p)  Addendum:  Spoke with patient's pump trainer regarding home insulin pump use.  She is very familiar with patient and states that she talks with patient weekly at least.  Her last visit to the MD's office in November, blood sugars were elevated and it was recommended that patient go to the hospital. Per Nehemiah Settle, it was recommended that patient go to the ED.  However, when called, patient refused stating that she needed to stay home to keep the children.  Social work consult placed.  Of note, patient delivered on 02/17/21 and likely need post-partum depression  assessment as well.

## 2021-05-07 NOTE — Assessment & Plan Note (Signed)
Patient is very brittle diabetic, extremely sensitive to insulin.  Uses insulin pump at home but recently her sensor stopped working.  She is having recurrent and refractory hypoglycemic episodes despite IV dextrose being given.  Possibly secondary to adrenal insufficiency but her a.m. level this morning was normal. -- Diabetes coordinator following, appreciate input -- On D10 infusion, stopped this afternoon with glucose in 200's on steroids -- Hypoglycemia protocol -- Close monitoring in stepdown unit  -- Continue Semglee 5 units daily-very sensitive sliding scale NovoLog q4h --Diabetes coordinator following

## 2021-05-07 NOTE — Assessment & Plan Note (Signed)
Present on admission with K6.0>> 6.8 on 12/14. K normal today 4.4 -- Off bicarb drip -- Monitor BMP and treat as needed

## 2021-05-07 NOTE — TOC Initial Note (Addendum)
Transition of Care [facility]) - Initial/Assessment Note    Patient Details  Name: Ashley Camacho MRN: [id number] Date of Birth: [birth year]  Transition of Care [facility]) CM/SW Contact:    Allayne Butcher, RN Phone Number: 05/07/2021, 3:37 PM  Clinical Narrative:                 Patient admitted to the hospital for type 1 diabetes and community acquired pneumonia.  TOC consulted for medication assistance and to evaluate for home needs.    Nursing and diabetes educator expressed concerns that the patient may be lacking in social support.  Patient agrees to speak with RNCM, sat down at the bedside to talk.  Patient is from home where she lives with her boyfriend, Sondra Come.  She and Sondra Come have 2 children together, 30 year old Girl Lucero, and 93 month old infant Girl [place].   Sondra Come works in a factory in [place] night shift.  He is not currently working while she is in the hospital because he is caring for the children.  Patient does not work and she does not drive, she has a permit but never had a license or driven.  They have one car, she reports that Sondra Come can pick her up at discharge.    She does express some food insecurities but she has WIC and is going to apply for food stamps, she reports she has the application from the health department but has not returned it yet.  Baby drinks formula.  Daughter goes to elementary school at [employer].  She denies physical or verbal abuse of herself and her children.  She reports that she and Sondra Come have a good relationship, they go out to eat when they can, Cruz's sister will babysit occasionally, but not since new baby arrived.  Patient reports that the baby cries all the time and not just crying but screaming, she reports this is stressful.  Patient is a type 1 diabetic and depends on an insulin pump and Dexcom.  She has insulin at home and did not run out, but her Dexcom did expire while she was sleeping and stopped communicating with the insulin  pump so she didn't get the insulin she needed over night.    Patient makes good eye contact during conversation, she is not emotional and even slightly smiles during the conversation at times.    Patient does not have a good social support system, no family that she regularly speaks to, no friends, she does not drive or work.    RNCM encouraged patient to reach out if she has any concerns or needs.        Expected Discharge Plan: Home/Self Care Barriers to Discharge: Continued Medical Work up   Patient Goals and CMS Choice        Expected Discharge Plan and Services Expected Discharge Plan: Home/Self Care   Discharge Planning Services: CM Consult   Living arrangements for the past 2 months: Single Family Home                 DME Arranged: N/A DME Agency: NA       HH Arranged: NA          Prior Living Arrangements/Services Living arrangements for the past 2 months: Single Family Home Lives with:: Significant Other, Minor Children Patient language and need for interpreter reviewed:: Yes Do you feel safe going back to the place where you live?: Yes      Need for Family  Participation in Patient Care: Yes (Comment) Care giver support system in place?: Yes (comment) (boyfriend)   Criminal Activity/Legal Involvement Pertinent to Current Situation/Hospitalization: No - Comment as needed  Activities of Daily Living      Permission Sought/Granted   Permission granted to share information with : No              Emotional Assessment Appearance:: Appears younger than stated age Attitude/Demeanor/Rapport: Engaged Affect (typically observed): Accepting Orientation: : Oriented to Self, Oriented to Place, Oriented to  Time, Oriented to Situation Alcohol / Substance Use: Not Applicable Psych Involvement: No (comment)  Admission diagnosis:  CAP (community acquired pneumonia) [J18.9] Type 1 diabetes mellitus with hypoglycemia and without coma (HCC)  [E10.649] Community acquired pneumonia of right middle lobe of lung [J18.9] Patient Active Problem List   Diagnosis Date Noted   Hyperkalemia 05/06/2021   History of adrenal insufficiency 05/06/2021   CAP (community acquired pneumonia) 05/05/2021   Hypoglycemia 04/27/2021   Postoperative hematoma involving genitourinary system following genitourinary procedure    S/P cesarean section 02/17/2021   Acute esophagitis    Leg edema    History of preterm delivery, currently pregnant    Pre-existing type 1 diabetes mellitus during pregnancy in third trimester    Anemia during pregnancy    Type 1 diabetes mellitus affecting pregnancy in second trimester, antepartum    HFrEF (heart failure with reduced ejection fraction) (HCC)    Acute deep vein thrombosis (DVT) of brachial vein of right upper extremity (HCC) 12/17/2020   Anxiety 12/17/2020   Diabetic retinopathy (HCC) 12/17/2020   Acute on chronic combined systolic and diastolic CHF (congestive heart failure) (HCC)    Hyperemesis 12/13/2020   Type 1 diabetes mellitus with hypoglycemia without coma (HCC) 12/13/2020   Sunburn 12/13/2020   Hypothyroidism 12/10/2020   Malnutrition (HCC) 12/10/2020   Vitreous hemorrhage of right eye (HCC) 12/10/2020   Pleural effusion associated with pulmonary infection 11/25/2020   Pneumonia affecting pregnancy 11/24/2020   Diabetes mellitus affecting pregnancy, second trimester 11/24/2020   Edema 11/24/2020   Decreased urine output 11/24/2020   Shortness of breath    Zinc deficiency    SOB (shortness of breath)    Social problem 11/21/2020   Nausea and vomiting during pregnancy prior to [redacted] weeks gestation 11/21/2020   Low serum vitamin B12    Delivery with history of C-section 11/20/2020   Absolute anemia    Acute kidney injury (HCC)    Nausea and vomiting during pregnancy 10/23/2020   History of premature delivery, currently pregnant, second trimester 10/13/2020   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 [place] [address] Phone: [phone] Fax: [phone]  [facility] [address] [place] [address] Phone: [phone] Fax: [phone]     Social Determinants of Health (SDOH) Interventions    Readmission Risk Interventions Readmission Risk Prevention Plan 05/07/2021  Transportation Screening Complete  PCP or Specialist Appt within 3-5 Days Complete  HRI or Home Care Consult Complete  Social Work Consult for Recovery Care Planning/Counseling Complete  Palliative Care Screening Not Applicable  Medication Review Oceanographer) Referral to Pharmacy  Some recent data might be hidden

## 2021-05-07 NOTE — Assessment & Plan Note (Signed)
With right arm pain preceding onset of vesicular appearing rash on right palm, history of varicella in childhood, concerning for Shingles. --Continue acyclovir --Airborne and contact precautions --Swab vesicle fluid for culture and VZV

## 2021-05-08 DIAGNOSIS — Z8639 Personal history of other endocrine, nutritional and metabolic disease: Secondary | ICD-10-CM | POA: Diagnosis not present

## 2021-05-08 DIAGNOSIS — E039 Hypothyroidism, unspecified: Secondary | ICD-10-CM | POA: Diagnosis not present

## 2021-05-08 DIAGNOSIS — J189 Pneumonia, unspecified organism: Secondary | ICD-10-CM | POA: Diagnosis not present

## 2021-05-08 DIAGNOSIS — F331 Major depressive disorder, recurrent, moderate: Secondary | ICD-10-CM | POA: Diagnosis not present

## 2021-05-08 DIAGNOSIS — E1043 Type 1 diabetes mellitus with diabetic autonomic (poly)neuropathy: Secondary | ICD-10-CM | POA: Diagnosis not present

## 2021-05-08 DIAGNOSIS — E10649 Type 1 diabetes mellitus with hypoglycemia without coma: Secondary | ICD-10-CM | POA: Diagnosis not present

## 2021-05-08 DIAGNOSIS — R21 Rash and other nonspecific skin eruption: Secondary | ICD-10-CM | POA: Diagnosis not present

## 2021-05-08 DIAGNOSIS — E1042 Type 1 diabetes mellitus with diabetic polyneuropathy: Secondary | ICD-10-CM | POA: Diagnosis not present

## 2021-05-08 LAB — BASIC METABOLIC PANEL
Anion gap: 4 — ABNORMAL LOW (ref 5–15)
BUN: 18 mg/dL (ref 6–20)
CO2: 28 mmol/L (ref 22–32)
Calcium: 8.1 mg/dL — ABNORMAL LOW (ref 8.9–10.3)
Chloride: 105 mmol/L (ref 98–111)
Creatinine, Ser: 1.21 mg/dL — ABNORMAL HIGH (ref 0.44–1.00)
GFR, Estimated: >60 mL/min (ref >60)
Glucose, Bld: 222 mg/dL — ABNORMAL HIGH (ref 70–99)
Potassium: 3.4 mmol/L — ABNORMAL LOW (ref 3.5–5.1)
Sodium: 137 mmol/L (ref 135–145)

## 2021-05-08 LAB — GLUCOSE, CAPILLARY
Glucose-Capillary: 269 mg/dL — ABNORMAL HIGH (ref 70–99)
Glucose-Capillary: 205 mg/dL — ABNORMAL HIGH (ref 70–99)
Glucose-Capillary: 123 mg/dL — ABNORMAL HIGH (ref 70–99)
Glucose-Capillary: 107 mg/dL — ABNORMAL HIGH (ref 70–99)
Glucose-Capillary: 344 mg/dL — ABNORMAL HIGH (ref 70–99)
Glucose-Capillary: 355 mg/dL — ABNORMAL HIGH (ref 70–99)

## 2021-05-08 LAB — ACTH: C206 ACTH: 27.3 pg/mL (ref 7.2–63.3)

## 2021-05-08 LAB — CBC
HCT: 22.7 % — ABNORMAL LOW (ref 36.0–46.0)
Hemoglobin: 8 g/dL — ABNORMAL LOW (ref 12.0–15.0)
MCH: 30.8 pg (ref 26.0–34.0)
MCHC: 35.2 g/dL (ref 30.0–36.0)
MCV: 87.3 fL (ref 80.0–100.0)
Platelets: 133 10*3/uL — ABNORMAL LOW (ref 150–400)
RBC: 2.6 MIL/uL — ABNORMAL LOW (ref 3.87–5.11)
RDW: 12.9 % (ref 11.5–15.5)
WBC: 7.1 10*3/uL (ref 4.0–10.5)
nRBC: 0 % (ref 0.0–0.2)

## 2021-05-08 LAB — PROCALCITONIN: Procalcitonin: 0.22 ng/mL

## 2021-05-08 LAB — MAGNESIUM: Magnesium: 1.8 mg/dL (ref 1.7–2.4)

## 2021-05-08 MED ORDER — MORPHINE SULFATE (PF) 2 MG/ML IV SOLN
1.0000 mg | INTRAVENOUS | Status: DC | PRN
  Administered 2021-05-08 – 2021-05-09 (×3): 1 mg via INTRAVENOUS
  Filled 2021-05-08 (×3): qty 1

## 2021-05-08 MED ORDER — GLUCERNA SHAKE PO LIQD
237.0000 mL | Freq: Three times a day (TID) | ORAL | Status: DC
  Administered 2021-05-08 – 2021-05-09 (×4): 237 mL via ORAL

## 2021-05-08 MED ORDER — MIRTAZAPINE 15 MG PO TABS
7.5000 mg | ORAL_TABLET | Freq: Every day | ORAL | Status: DC
  Administered 2021-05-08 – 2021-05-09 (×2): 7.5 mg via ORAL
  Filled 2021-05-08 (×2): qty 1

## 2021-05-08 MED ORDER — DEXTROSE 5 % IV SOLN
10.0000 mg/kg | Freq: Three times a day (TID) | INTRAVENOUS | Status: AC
  Administered 2021-05-08 – 2021-05-10 (×8): 520 mg via INTRAVENOUS
  Filled 2021-05-08 (×9): qty 10.4

## 2021-05-08 MED ORDER — HYDROCODONE-ACETAMINOPHEN 5-325 MG PO TABS
1.0000 | ORAL_TABLET | Freq: Four times a day (QID) | ORAL | Status: DC | PRN
  Administered 2021-05-08: 1 via ORAL
  Administered 2021-05-09 (×2): 2 via ORAL
  Administered 2021-05-09 – 2021-05-10 (×2): 1 via ORAL
  Administered 2021-05-10: 01:00:00 2 via ORAL
  Administered 2021-05-10: 10:00:00 1 via ORAL
  Filled 2021-05-08: qty 2
  Filled 2021-05-08 (×2): qty 1
  Filled 2021-05-08 (×2): qty 2
  Filled 2021-05-08 (×2): qty 1

## 2021-05-08 MED ORDER — SODIUM CHLORIDE 0.9 % IV SOLN: INTRAVENOUS | Status: DC

## 2021-05-08 MED ORDER — DEXTROSE-NACL 5-0.9 % IV SOLN: INTRAVENOUS | Status: DC

## 2021-05-08 MED ORDER — DEXTROSE 5 % IV SOLN
10.0000 mg/kg | Freq: Three times a day (TID) | INTRAVENOUS | Status: DC
Start: 2021-05-08 — End: 2021-05-08

## 2021-05-08 NOTE — Assessment & Plan Note (Signed)
Presented with dry cough and chest xray showing RML opacity.   Improved. No longer complaining of respiratory symptoms. -- Continue empiric Rocephin and Zithromax --Monitor oxygenation and supplement O2 if SPO2 below 90% on room air -- Supportive care

## 2021-05-08 NOTE — Assessment & Plan Note (Signed)
Pt reports deep aching right arm pain for a couple of weeks prior to admission.  Started on gabapentin which has not helped.  Now with rash on her right palm.  (See rash A&P)

## 2021-05-08 NOTE — Consult Note (Signed)
[facility] Consult   Reason for Consult: Consult for 30 year old woman with diabetes on insulin pump currently in the hospital after an episode of hypoglycemia with other complications.  Concern about depression Referring Physician: Arbutus Ped Patient Identification: Ashley Camacho MRN:  [id number] Principal Diagnosis: Moderate recurrent major depression ([place]) Diagnosis:  Principal Problem:   Moderate recurrent major depression (HCC) Active Problems:   Type 1 diabetes mellitus with hypoglycemia without coma (HCC)   Hypothyroidism   Peripheral neuropathy   Diabetic gastroparesis (HCC)   CAP (community acquired pneumonia)   Hyperkalemia   History of adrenal insufficiency   Right arm pain   Rash of hand   Total Time spent with patient: 1 hour  Subjective:   Ashley Camacho is a 30 y.o. female patient admitted with "my sugars went low".  HPI: Patient seen and chart reviewed.  30 year old woman presented to the hospital with altered mental status related to hypoglycemia.  Patient says she remembers that she had gotten up in the morning to take care of her 69 year old daughter going off to school and then she went back to sleep and did not wake up after that.  She did not have anything to eat while she was awake.  Patient says that the sensor did not notify her of her low blood sugar because somehow it does not do the notification when it is hooked up to the pump.  She denies the pump running out of insulin and does not describe any other use of insulin.  Patient says that she has been under a lot of stress and has been feeling tired and overwhelmed since her baby was born 2 months ago.  She denies the word "depressed" but becomes tearful during the conversation and says she feels a lot of stress.  She is not working outside the home.  Boyfriend works at night and is sleeping during the day.  Sounds like they have a bit of a stressful relationship.  She admits that there are  serious financial problems causing food scarcity.  She says because they often end up eating out rather than buying groceries they run short of money and have little food at home and so there is often nothing for her to eat during the day.  She claims that she does not really feel hungry however.  Sleep is a little bit erratic and interrupted.  Patient does feel chronically fatigued.  She denies any suicidal thoughts.  Denies any psychosis.  Not currently seeing anyone for mental health treatment or under any medication for depression.  Denies substance abuse.  Past Psychiatric History: Patient was treated for depression as an adolescent when her father died.  Does not recall medication.  No inpatient hospitalization.  No history of suicide attempts.  I have seen her 1 other time some years ago for similar circumstances in the hospital.  Risk to Self:   Risk to Others:   Prior Inpatient Therapy:   Prior Outpatient Therapy:    Past Medical History:  Past Medical History:  Diagnosis Date   Adrenal insufficiency ([place])    Anemia    Gastroparesis    Hypertension    Type 1 diabetes ([place])     Past Surgical History:  Procedure Laterality Date   BIOPSY  01/14/2021   Procedure: BIOPSY;  Surgeon: Thornton Park, MD;  Location: [facility];  Service: Gastroenterology;;   CESAREAN SECTION     CESAREAN SECTION WITH BILATERAL TUBAL LIGATION N/A 02/17/2021   Procedure: CESAREAN  SECTION WITH BILATERAL TUBAL LIGATION;  Surgeon: Truett Mainland, DO;  Location: [facility];  Service: Obstetrics;  Laterality: N/A;   ESOPHAGOGASTRODUODENOSCOPY (EGD) WITH PROPOFOL N/A 01/14/2021   Procedure: ESOPHAGOGASTRODUODENOSCOPY (EGD) WITH PROPOFOL;  Surgeon: Thornton Park, MD;  Location: [facility];  Service: Gastroenterology;  Laterality: N/A;   MOUTH SURGERY     Family History:  Family History  Problem Relation Age of Onset   Diabetes type I Father    CAD Father    CAD Paternal 68    CAD Paternal  Grandfather    Breast cancer Mother    Ovarian cancer Neg Hx    Family Psychiatric  History: Does not report any Social History:  Social History   Substance and Sexual Activity  Alcohol Use No     Social History   Substance and Sexual Activity  Drug Use No    Social History   Socioeconomic History   Marital status: Single    Spouse name: Not on file   Number of children: Not on file   Years of education: Not on file   Highest education level: Not on file  Occupational History   Occupation: home maker  Tobacco Use   Smoking status: Never   Smokeless tobacco: Never  Vaping Use   Vaping Use: Never used  Substance and Sexual Activity   Alcohol use: No   Drug use: No   Sexual activity: Yes    Birth control/protection: None  Other Topics Concern   Not on file  Social History Narrative   Not on file   Social Determinants of Health   Financial Resource Strain: Not on file  Food Insecurity: Not on file  Transportation Needs: Not on file  Physical Activity: Not on file  Stress: Not on file  Social Connections: Not on file   Additional Social History:    Allergies:  No Known Allergies  Labs:  Results for orders placed or performed during the hospital encounter of 05/05/21 (from the past 48 hour(s))  Glucose, capillary     Status: Abnormal   Collection Time: 05/06/21  5:01 PM  Result Value Ref Range   Glucose-Capillary 158 (H) 70 - 99 mg/dL    Comment: Glucose reference range applies only to samples taken after fasting for at least 8 hours.  Glucose, capillary     Status: Abnormal   Collection Time: 05/06/21  6:09 PM  Result Value Ref Range   Glucose-Capillary 138 (H) 70 - 99 mg/dL    Comment: Glucose reference range applies only to samples taken after fasting for at least 8 hours.  Glucose, capillary     Status: Abnormal   Collection Time: 05/06/21  7:55 PM  Result Value Ref Range   Glucose-Capillary 107 (H) 70 - 99 mg/dL    Comment: Glucose reference range  applies only to samples taken after fasting for at least 8 hours.  Glucose, capillary     Status: None   Collection Time: 05/06/21  8:57 PM  Result Value Ref Range   Glucose-Capillary 97 70 - 99 mg/dL    Comment: Glucose reference range applies only to samples taken after fasting for at least 8 hours.  Glucose, capillary     Status: None   Collection Time: 05/06/21 10:05 PM  Result Value Ref Range   Glucose-Capillary 95 70 - 99 mg/dL    Comment: Glucose reference range applies only to samples taken after fasting for at least 8 hours.  Glucose, capillary  Status: None   Collection Time: 05/06/21 11:02 PM  Result Value Ref Range   Glucose-Capillary 91 70 - 99 mg/dL    Comment: Glucose reference range applies only to samples taken after fasting for at least 8 hours.  Glucose, capillary     Status: None   Collection Time: 05/07/21 12:02 AM  Result Value Ref Range   Glucose-Capillary 73 70 - 99 mg/dL    Comment: Glucose reference range applies only to samples taken after fasting for at least 8 hours.  Glucose, capillary     Status: Abnormal   Collection Time: 05/07/21  1:09 AM  Result Value Ref Range   Glucose-Capillary 64 (L) 70 - 99 mg/dL    Comment: Glucose reference range applies only to samples taken after fasting for at least 8 hours.  Glucose, capillary     Status: Abnormal   Collection Time: 05/07/21  2:07 AM  Result Value Ref Range   Glucose-Capillary 128 (H) 70 - 99 mg/dL    Comment: Glucose reference range applies only to samples taken after fasting for at least 8 hours.  Glucose, capillary     Status: Abnormal   Collection Time: 05/07/21  3:14 AM  Result Value Ref Range   Glucose-Capillary 117 (H) 70 - 99 mg/dL    Comment: Glucose reference range applies only to samples taken after fasting for at least 8 hours.  Glucose, capillary     Status: Abnormal   Collection Time: 05/07/21  4:05 AM  Result Value Ref Range   Glucose-Capillary 121 (H) 70 - 99 mg/dL    Comment:  Glucose reference range applies only to samples taken after fasting for at least 8 hours.  Cortisol, Random     Status: None   Collection Time: 05/07/21  4:47 AM  Result Value Ref Range   Cortisol, Plasma 6.3 ug/dL    Comment: (NOTE) AM    6.7 - 22.6 ug/dL PM   <10.0       ug/dL Performed at [facility] [address], [place], [place] [address]   ACTH Level     Status: None   Collection Time: 05/07/21  4:47 AM  Result Value Ref Range   C206 ACTH 27.3 7.2 - 63.3 pg/mL    Comment: (NOTE) ACTH reference interval for samples collected between 7 and 10 AM. Performed At: [facility] [place], [place] [id number] Rush Farmer MD [id number]   Comprehensive metabolic panel     Status: Abnormal   Collection Time: 05/07/21  4:47 AM  Result Value Ref Range   Sodium 136 135 - 145 mmol/L   Potassium 4.4 3.5 - 5.1 mmol/L   Chloride 101 98 - 111 mmol/L   CO2 28 22 - 32 mmol/L   Glucose, Bld 95 70 - 99 mg/dL    Comment: Glucose reference range applies only to samples taken after fasting for at least 8 hours.   BUN 21 (H) 6 - 20 mg/dL   Creatinine, Ser 1.40 (H) 0.44 - 1.00 mg/dL   Calcium 7.9 (L) 8.9 - 10.3 mg/dL   Total Protein 5.6 (L) 6.5 - 8.1 g/dL   Albumin 2.8 (L) 3.5 - 5.0 g/dL   AST 16 15 - 41 U/L   ALT 9 0 - 44 U/L   Alkaline Phosphatase 42 38 - 126 U/L   Total Bilirubin 0.5 0.3 - 1.2 mg/dL   GFR, Estimated 52 (L) >60 mL/min    Comment: (NOTE) Calculated using the CKD-EPI  Creatinine Equation (2021)    Anion gap 7 5 - 15    Comment: Performed at [facility], [address] [place], [place] [address]  CBC     Status: Abnormal   Collection Time: 05/07/21  4:47 AM  Result Value Ref Range   WBC 3.7 (L) 4.0 - 10.5 K/uL   RBC 2.43 (L) 3.87 - 5.11 MIL/uL   Hemoglobin 7.5 (L) 12.0 - 15.0 g/dL   HCT 34.7 (L) 42.5 - 95.6 %   MCV 90.5 80.0 - 100.0 fL   MCH 30.9 26.0 - 34.0 pg   MCHC 34.1 30.0 - 36.0 g/dL   RDW 38.7 56.4 - 33.2 %    Platelets 139 (L) 150 - 400 K/uL   nRBC 0.0 0.0 - 0.2 %    Comment: Performed at [facility], [address], [place], [place] [address]  Magnesium     Status: None   Collection Time: 05/07/21  4:47 AM  Result Value Ref Range   Magnesium 1.7 1.7 - 2.4 mg/dL    Comment: Performed at [facility], [address] [place], [place] [address]  Phosphorus     Status: None   Collection Time: 05/07/21  4:47 AM  Result Value Ref Range   Phosphorus 2.9 2.5 - 4.6 mg/dL    Comment: Performed at [facility], [address] [place], [place] [address]  Procalcitonin     Status: None   Collection Time: 05/07/21  4:47 AM  Result Value Ref Range   Procalcitonin 0.52 ng/mL    Comment:        Interpretation: PCT > 0.5 ng/mL and <= 2 ng/mL: Systemic infection (sepsis) is possible, but other conditions are known to elevate PCT as well. (NOTE)       Sepsis PCT Algorithm           Lower Respiratory Tract                                      Infection PCT Algorithm    ----------------------------     ----------------------------         PCT < 0.25 ng/mL                PCT < 0.10 ng/mL          Strongly encourage             Strongly discourage   discontinuation of antibiotics    initiation of antibiotics    ----------------------------     -----------------------------       PCT 0.25 - 0.50 ng/mL            PCT 0.10 - 0.25 ng/mL               OR       >80% decrease in PCT            Discourage initiation of                                            antibiotics      Encourage discontinuation           of antibiotics    ----------------------------     -----------------------------         PCT >= 0.50 ng/mL  PCT 0.26 - 0.50 ng/mL                AND       <80% decrease in PCT             Encourage initiation of                                             antibiotics       Encourage continuation           of antibiotics    ----------------------------      -----------------------------        PCT >= 0.50 ng/mL                  PCT > 0.50 ng/mL               AND         increase in PCT                  Strongly encourage                                      initiation of antibiotics    Strongly encourage escalation           of antibiotics                                     -----------------------------                                           PCT <= 0.25 ng/mL                                                 OR                                        > 80% decrease in PCT                                      Discontinue / Do not initiate                                             antibiotics  Performed at [facility], [address] [place], [place] [address]   Glucose, capillary     Status: None   Collection Time: 05/07/21  4:59 AM  Result Value Ref Range   Glucose-Capillary 91 70 - 99 mg/dL    Comment: Glucose reference range applies only to samples taken after fasting for at least 8 hours.  Glucose, capillary     Status: None   Collection Time: 05/07/21  6:06 AM  Result Value Ref Range   Glucose-Capillary 90 70 - 99 mg/dL    Comment: Glucose reference range applies only to samples taken after fasting for at least 8 hours.  Glucose, capillary     Status: Abnormal   Collection Time: 05/07/21  7:22 AM  Result Value Ref Range   Glucose-Capillary 110 (H) 70 - 99 mg/dL    Comment: Glucose reference range applies only to samples taken after fasting for at least 8 hours.  Glucose, capillary     Status: Abnormal   Collection Time: 05/07/21  8:14 AM  Result Value Ref Range   Glucose-Capillary 128 (H) 70 - 99 mg/dL    Comment: Glucose reference range applies only to samples taken after fasting for at least 8 hours.  Glucose, capillary     Status: Abnormal   Collection Time: 05/07/21  9:26 AM  Result Value Ref Range   Glucose-Capillary 152 (H) 70 - 99 mg/dL    Comment: Glucose reference range applies only to samples taken  after fasting for at least 8 hours.  Glucose, capillary     Status: Abnormal   Collection Time: 05/07/21 10:58 AM  Result Value Ref Range   Glucose-Capillary 157 (H) 70 - 99 mg/dL    Comment: Glucose reference range applies only to samples taken after fasting for at least 8 hours.  Glucose, capillary     Status: Abnormal   Collection Time: 05/07/21 12:44 PM  Result Value Ref Range   Glucose-Capillary 150 (H) 70 - 99 mg/dL    Comment: Glucose reference range applies only to samples taken after fasting for at least 8 hours.  Glucose, capillary     Status: Abnormal   Collection Time: 05/07/21  2:16 PM  Result Value Ref Range   Glucose-Capillary 221 (H) 70 - 99 mg/dL    Comment: Glucose reference range applies only to samples taken after fasting for at least 8 hours.  Glucose, capillary     Status: Abnormal   Collection Time: 05/07/21  5:20 PM  Result Value Ref Range   Glucose-Capillary 200 (H) 70 - 99 mg/dL    Comment: Glucose reference range applies only to samples taken after fasting for at least 8 hours.  Glucose, capillary     Status: Abnormal   Collection Time: 05/08/21  1:40 AM  Result Value Ref Range   Glucose-Capillary 269 (H) 70 - 99 mg/dL    Comment: Glucose reference range applies only to samples taken after fasting for at least 8 hours.  Procalcitonin     Status: None   Collection Time: 05/08/21  5:30 AM  Result Value Ref Range   Procalcitonin 0.22 ng/mL    Comment:        Interpretation: PCT (Procalcitonin) <= 0.5 ng/mL: Systemic infection (sepsis) is not likely. Local bacterial infection is possible. (NOTE)       Sepsis PCT Algorithm           Lower Respiratory Tract                                      Infection PCT Algorithm    ----------------------------     ----------------------------         PCT < 0.25 ng/mL                PCT < 0.10 ng/mL          Strongly encourage  Strongly discourage   discontinuation of antibiotics    initiation of  antibiotics    ----------------------------     -----------------------------       PCT 0.25 - 0.50 ng/mL            PCT 0.10 - 0.25 ng/mL               OR       >80% decrease in PCT            Discourage initiation of                                            antibiotics      Encourage discontinuation           of antibiotics    ----------------------------     -----------------------------         PCT >= 0.50 ng/mL              PCT 0.26 - 0.50 ng/mL               AND        <80% decrease in PCT             Encourage initiation of                                             antibiotics       Encourage continuation           of antibiotics    ----------------------------     -----------------------------        PCT >= 0.50 ng/mL                  PCT > 0.50 ng/mL               AND         increase in PCT                  Strongly encourage                                      initiation of antibiotics    Strongly encourage escalation           of antibiotics                                     -----------------------------                                           PCT <= 0.25 ng/mL                                                 OR                                        >  80% decrease in PCT                                      Discontinue / Do not initiate                                             antibiotics  Performed at [facility], [address] [place], [place] [id number]   Basic metabolic panel     Status: Abnormal   Collection Time: 05/08/21  5:30 AM  Result Value Ref Range   Sodium 137 135 - 145 mmol/L   Potassium 3.4 (L) 3.5 - 5.1 mmol/L   Chloride 105 98 - 111 mmol/L   CO2 28 22 - 32 mmol/L   Glucose, Bld 222 (H) 70 - 99 mg/dL    Comment: Glucose reference range applies only to samples taken after fasting for at least 8 hours.   BUN 18 6 - 20 mg/dL   Creatinine, Ser 1.21 (H) 0.44 - 1.00 mg/dL   Calcium 8.1 (L) 8.9 - 10.3 mg/dL   GFR, Estimated >60  >60 mL/min    Comment: (NOTE) Calculated using the CKD-EPI Creatinine Equation (2021)    Anion gap 4 (L) 5 - 15    Comment: Performed at [facility], [address] [place], [place] [address]  CBC     Status: Abnormal   Collection Time: 05/08/21  5:30 AM  Result Value Ref Range   WBC 7.1 4.0 - 10.5 K/uL   RBC 2.60 (L) 3.87 - 5.11 MIL/uL   Hemoglobin 8.0 (L) 12.0 - 15.0 g/dL   HCT 22.7 (L) 36.0 - 46.0 %   MCV 87.3 80.0 - 100.0 fL   MCH 30.8 26.0 - 34.0 pg   MCHC 35.2 30.0 - 36.0 g/dL   RDW 12.9 11.5 - 15.5 %   Platelets 133 (L) 150 - 400 K/uL   nRBC 0.0 0.0 - 0.2 %    Comment: Performed at [facility], [address], [place], [place] [address]  Magnesium     Status: None   Collection Time: 05/08/21  5:30 AM  Result Value Ref Range   Magnesium 1.8 1.7 - 2.4 mg/dL    Comment: Performed at [facility], [address] [place],  [address]  Glucose, capillary     Status: Abnormal   Collection Time: 05/08/21  7:39 AM  Result Value Ref Range   Glucose-Capillary 123 (H) 70 - 99 mg/dL    Comment: Glucose reference range applies only to samples taken after fasting for at least 8 hours.  Glucose, capillary     Status: Abnormal   Collection Time: 05/08/21 11:07 AM  Result Value Ref Range   Glucose-Capillary 107 (H) 70 - 99 mg/dL    Comment: Glucose reference range applies only to samples taken after fasting for at least 8 hours.  Glucose, capillary     Status: Abnormal   Collection Time: 05/08/21  4:13 PM  Result Value Ref Range   Glucose-Capillary 344 (H) 70 - 99 mg/dL    Comment: Glucose reference range applies only to samples taken after fasting for at least 8 hours.    Current Facility-Administered Medications  Medication Dose Route Frequency Provider Last Rate Last Admin   acetaminophen (TYLENOL) tablet 650  mg  650 mg Oral Q6H PRN Mansy, Jan A, MD   650 mg at 05/06/21 2051   Or   acetaminophen (TYLENOL) suppository 650 mg  650 mg Rectal  Q6H PRN Mansy, Jan A, MD       acyclovir (ZOVIRAX) 520 mg in dextrose 5 % 100 mL IVPB  10 mg/kg Intravenous Q8H Deal, Adonis Housekeeper, RPH 110.4 mL/hr at 05/08/21 1520 520 mg at 05/08/21 1520   azithromycin (ZITHROMAX) 500 mg in sodium chloride 0.9 % 250 mL IVPB  500 mg Intravenous Q24H Sharman Cheek, MD 250 mL/hr at 05/08/21 1401 500 mg at 05/08/21 1401   cefTRIAXone (ROCEPHIN) 2 g in sodium chloride 0.9 % 100 mL IVPB  2 g Intravenous Q24H Sharman Cheek, MD 200 mL/hr at 05/08/21 1400 2 g at 05/08/21 1400   Chlorhexidine Gluconate Cloth 2 % PADS 6 each  6 each Topical Daily Esaw Grandchild A, DO   6 each at 05/07/21 1114   dextrose 5 %-0.9 % sodium chloride infusion   Intravenous Continuous Esaw Grandchild A, DO 75 mL/hr at 05/08/21 1356 New Bag at 05/08/21 1356   enoxaparin (LOVENOX) injection 40 mg  40 mg Subcutaneous Q24H Mansy, Jan A, MD   40 mg at 05/07/21 2205   feeding supplement (GLUCERNA SHAKE) (GLUCERNA SHAKE) liquid 237 mL  237 mL Oral TID BM Esaw Grandchild A, DO   237 mL at 05/08/21 1356   HYDROcodone-acetaminophen (NORCO/VICODIN) 5-325 MG per tablet 1-2 tablet  1-2 tablet Oral Q6H PRN Esaw Grandchild A, DO   1 tablet at 05/08/21 1403   hydrocortisone sodium succinate (SOLU-CORTEF) 100 MG injection 50 mg  50 mg Intravenous Q8H Griffith, Kelly A, DO   50 mg at 05/08/21 1350   insulin aspart (novoLOG) injection 0-6 Units  0-6 Units Subcutaneous Q4H Esaw Grandchild A, DO   2 Units at 05/08/21 0524   insulin glargine-yfgn (SEMGLEE) injection 5 Units  5 Units Subcutaneous Daily Esaw Grandchild A, DO   5 Units at 05/08/21 0759   levothyroxine (SYNTHROID) tablet 25 mcg  25 mcg Oral Q0600 Esaw Grandchild A, DO   25 mcg at 05/08/21 0524   loperamide (IMODIUM) capsule 4 mg  4 mg Oral PRN Cox, Amy N, DO   4 mg at 05/06/21 0448   magnesium hydroxide (MILK OF MAGNESIA) suspension 30 mL  30 mL Oral Daily PRN Mansy, Jan A, MD       metoCLOPramide (REGLAN) injection 10 mg  10 mg Intravenous Q6H Mansy,  Jan A, MD   10 mg at 05/08/21 2297   metoprolol tartrate (LOPRESSOR) injection 2.5 mg  2.5 mg Intravenous Q2H PRN Cox, Amy N, DO       mirtazapine (REMERON) tablet 7.5 mg  7.5 mg Oral QHS Joby Hershkowitz T, MD       morphine 2 MG/ML injection 1 mg  1 mg Intravenous Q4H PRN Esaw Grandchild A, DO   1 mg at 05/08/21 1521   multivitamin with minerals tablet 1 tablet  1 tablet Oral Daily Esaw Grandchild A, DO   1 tablet at 05/08/21 0758   ondansetron (ZOFRAN) tablet 4 mg  4 mg Oral Q6H PRN Mansy, Jan A, MD       Or   ondansetron [facility]) injection 4 mg  4 mg Intravenous Q6H PRN Mansy, Jan A, MD   4 mg at 05/07/21 1536   sodium chloride flush (NS) 0.9 % injection 10-40 mL  10-40 mL Intracatheter Q12H Esaw Grandchild A, DO  10 mL at 05/08/21 0801   sodium chloride flush (NS) 0.9 % injection 10-40 mL  10-40 mL Intracatheter PRN Nicole Kindred A, DO       traZODone (DESYREL) tablet 25 mg  25 mg Oral QHS PRN Mansy, Arvella Merles, MD        Musculoskeletal: Strength & Muscle Tone: decreased Gait & Station: normal Patient leans: N/A            Psychiatric Specialty Exam:  Presentation  General Appearance: No data recorded Eye Contact:No data recorded Speech:No data recorded Speech Volume:No data recorded Handedness:No data recorded  Mood and Affect  Mood:No data recorded Affect:No data recorded  Thought Process  Thought Processes:No data recorded Descriptions of Associations:No data recorded Orientation:No data recorded Thought Content:No data recorded History of Schizophrenia/Schizoaffective disorder:No data recorded Duration of Psychotic Symptoms:No data recorded Hallucinations:No data recorded Ideas of Reference:No data recorded Suicidal Thoughts:No data recorded Homicidal Thoughts:No data recorded  Sensorium  Memory:No data recorded Judgment:No data recorded Insight:No data recorded  Executive Functions  Concentration:No data recorded Attention Span:No data  recorded Recall:No data recorded Fund of Knowledge:No data recorded Language:No data recorded  Psychomotor Activity  Psychomotor Activity:No data recorded  Assets  Assets:No data recorded  Sleep  Sleep:No data recorded  Physical Exam: Physical Exam Vitals and nursing note reviewed.  Constitutional:      Appearance: Normal appearance. She is ill-appearing.  HENT:     Head: Normocephalic and atraumatic.     Mouth/Throat:     Pharynx: Oropharynx is clear.  Eyes:     Pupils: Pupils are equal, round, and reactive to light.  Cardiovascular:     Rate and Rhythm: Normal rate and regular rhythm.  Pulmonary:     Effort: Pulmonary effort is normal.     Breath sounds: Normal breath sounds.  Abdominal:     General: Abdomen is flat.     Palpations: Abdomen is soft.  Musculoskeletal:        General: Normal range of motion.  Skin:    General: Skin is warm and dry.  Neurological:     General: No focal deficit present.     Mental Status: She is alert. Mental status is at baseline.  Psychiatric:        Attention and Perception: Attention normal.        Mood and Affect: Mood is anxious. Affect is tearful.        Speech: Speech normal.        Behavior: Behavior is cooperative.        Thought Content: Thought content normal.        Cognition and Memory: Cognition normal.        Judgment: Judgment normal.   Review of Systems  Constitutional:  Positive for malaise/fatigue.  HENT: Negative.    Eyes: Negative.   Respiratory: Negative.    Cardiovascular: Negative.   Gastrointestinal: Negative.   Musculoskeletal: Negative.   Skin: Negative.   Neurological: Negative.   Psychiatric/Behavioral:  Negative for depression, hallucinations, substance abuse and suicidal ideas. The patient is nervous/anxious and has insomnia.   Blood pressure (!) 113/59, pulse 87, temperature 98.4 F (36.9 C), temperature source Oral, resp. rate 13, weight 52.2 kg, SpO2 96 %, unknown if currently  breastfeeding. Body mass index is 21.74 kg/m.  Treatment Plan Summary: Medication management and Plan patient is endorsing a lot of feelings of fatigue feeling overwhelmed all the time generally down mood although she does not describe it as depressed.  She does  appear tearful and is not eating well and not taking care of herself very well.  She understands this but rationalizes it because of their social and financial problems at home.  Patient counseled about the obvious life or death importance of eating regularly for her.  Also counseled about how depression can be present sometimes even without a person feeling "depressed".  I suggested adding mirtazapine 7.5 mg at night as an initial treatment for depression which should be well tolerated and affordable and help with appetite and sleep.  If she tolerates this the dose can be ratcheted up over several days.  Target dose for treating depression is usually 30 to 45 mg at night if tolerated.  Patient can be seen by psychiatric consult service for follow-up while she is in the hospital.  She should be referred to [facility] for outpatient mental health follow-up at discharge  Disposition: Patient does not meet criteria for psychiatric inpatient admission. Supportive therapy provided about ongoing stressors. Discussed crisis plan, support from social network, calling 911, coming to the Emergency Department, and calling Suicide Hotline.  Alethia Berthold, MD 05/08/2021 4:34 PM

## 2021-05-08 NOTE — Progress Notes (Signed)
Pharmacy Anti-Infective Agent Note  Ashley Camacho is a 30 y.o. female with PMH of type 1 diabetes mellitus on insulin pump, peripheral neuropathy,  and adrenal insufficiency who was admitted on 05/05/2021 with possible shingles outbreak.  Pharmacy has been consulted for Acyclovir dosing.  Scr 1.37>1.59>1.40>1.21 (est. CrCl 51.8)  MIVF: NS @ 142mL/hr   Plan: Acyclovir  --Will transition to acyclovir 520mg  (10 mg/kg w/ TBW) q8h given current renal function   Pharmacy will continue to follow and will adjust dose and/or transition to oral dose form when appropriate.  Weight: 52.2 kg (115 lb 1.3 oz)  Temp (24hrs), Avg:99.2 F (37.3 C), Min:98.9 F (37.2 C), Max:99.8 F (37.7 C)  Recent Labs  Lab 05/05/21 1209 05/05/21 1310 05/05/21 1807 05/05/21 2342 05/06/21 0745 05/07/21 0447 05/08/21 0530  WBC 6.2  --   --   --  4.9 3.7* 7.1  CREATININE 1.50*  --  1.28* 1.37* 1.59* 1.40* 1.21*  LATICACIDVEN  --  1.3  --   --   --   --   --      Estimated Creatinine Clearance: 51.8 mL/min (A) (by C-G formula based on SCr of 1.21 mg/dL (H)).    No Known Allergies  Anti-Infectives this admission: 12/13 Ceftriaxone >> 12/18 (5 days) 12/13 Azithromycin >> 12/18 (5 days) 12/15 Acyclovir >> 12/22  (7 days)  Microbiology results: 12/13 BCx: NG x3 days 12/13 C difficile screen: sent 12/13 MRSA PCR: sent  Thank you for allowing pharmacy to be a part of this patients care.  1/14, PharmD Pharmacy Resident  05/08/2021 7:21 AM

## 2021-05-08 NOTE — Assessment & Plan Note (Signed)
With right arm pain preceding onset of vesicular appearing rash on right palm, history of varicella in childhood, concerning for Shingles. --Continue acyclovir --Airborne and contact precautions --Swab vesicle fluid for culture and VZV  --Pain control as needed

## 2021-05-08 NOTE — Assessment & Plan Note (Signed)
Resolved.  Present on admission with K6.0>> 6.8 on 12/14. Treated with IV insulin + D50 and bicarb drip. K today down from 4.4>>3.4 -- Off bicarb drip -- Monitor BMP and treat as needed

## 2021-05-08 NOTE — Progress Notes (Signed)
Initial Nutrition Assessment  DOCUMENTATION CODES:   Not applicable  INTERVENTION:   Glucerna Shake po TID, each supplement provides 220 kcal and 10 grams of protein  MVI po daily   Pt at high refeed risk; recommend monitor potassium, magnesium and phosphorus labs daily until stable  NUTRITION DIAGNOSIS:   Inadequate oral intake related to chronic illness (gastroparesis) as evidenced by per patient/family report.  GOAL:   Patient will meet greater than or equal to 90% of their needs  MONITOR:   PO intake, Supplement acceptance, Labs, Weight trends, Skin, I & O's  REASON FOR ASSESSMENT:   Consult Assessment of nutrition requirement/status  ASSESSMENT:   30 y.o. caucasian female with medical history significant for type 1 diabetes mellitus on insulin pump, hypertension, diabetic gastroparesis, anemia, adrenal insufficiency, hyperemesis in pregnancy s/p C-section and tubal ligation on 02/17/2021 who is admitted with CAP and shingles.  Met with pt in room today. Pt is well known to this RD from a recent previous admit. Pt with h/o hyperemesis during both of her pregnancies. Pt required TPN and post-pyloric nasogastric tube with nutrition support during her last pregnancy and is s/p delivery by C-section in September. Pt reports that she has been doing well since delivering her baby. Pt reports intermittent nausea and vomiting r/t her gastroparesis. Pt has not been taking any reglan at home. Pt is not taking a daily MVI. Per chart, pt is down 52lbs(32%) since having her baby; pt appears to be back to her UBW. Pt reports that she is not breastfeeding at home. RD discussed with pt the importance of adequate nutrition needed to preserve lean muscle. Pt does not like Nepro and would like to have Glucerna in hospital as she was drinking this last time. RD will add supplements and MVI to help pt meet her estimated needs. Pt is at high refeed risk. Pt reports eating 100% of her breakfast this  morning and is documented to have eaten 100% of her dinner after addition of reglan.   Medications reviewed and include: lovenox, solu-cortef, insulin, synthroid, reglan, MVI, azithromycin, ceftriaxone   Labs reviewed: K 3.4(L), creat 1.21(H), Mg 1.8 wnl P 2.9 wnl- 12/15 Hgb 8.0(L), Hct 22.7(L) Cbgs- 123, 269 x 24 hrs AIC 6.6(H)- 12/5  NUTRITION - FOCUSED PHYSICAL EXAM:  Flowsheet Row Most Recent Value  Orbital Region No depletion  Upper Arm Region No depletion  Thoracic and Lumbar Region No depletion  Buccal Region No depletion  Temple Region No depletion  Clavicle Bone Region Mild depletion  Clavicle and Acromion Bone Region Mild depletion  Scapular Bone Region No depletion  Dorsal Hand No depletion  Patellar Region No depletion  Anterior Thigh Region No depletion  Posterior Calf Region No depletion  Edema (RD Assessment) Mild  Hair Reviewed  Eyes Reviewed  Mouth Reviewed  Skin Reviewed  Nails Reviewed   Diet Order:   Diet Order             Diet Carb Modified Fluid consistency: Thin; Room service appropriate? Yes  Diet effective now                  EDUCATION NEEDS:   Education needs have been addressed  Skin:  Skin Assessment: Reviewed RN Assessment  Last BM:  12/16- type 6  Height:   Ht Readings from Last 1 Encounters:  04/28/21 5' 1"  (1.549 m)    Weight:   Wt Readings from Last 1 Encounters:  05/06/21 52.2 kg    Ideal Body  Weight:  47.7 kg  BMI:  Body mass index is 21.74 kg/m.  Estimated Nutritional Needs:   Kcal:  1600-1800kcal/day  Protein:  80-90g/day  Fluid:  1.6-1.8L/day  Koleen Distance MS, RD, LDN Please refer to [facility] for RD and/or RD on-call/weekend/after hours pager

## 2021-05-08 NOTE — Progress Notes (Signed)
Progress Note    SIMRAT KENDRICK   LNL:[id number]  DOB: [birth year]  DOA: 05/05/2021     3 Date of Service: 05/08/2021   Brief Narrative Ashley Camacho is a 30 y.o. Caucasian female with medical history significant for type 1 diabetes mellitus on insulin pump, hypertension diabetic gastroparesis and recent observation here for hypoglycemia on 12/5 that were thought be related to taking Lantus as she was not able to use her insulin pump due to lack of insulin.  She presented to the ED on 05/05/2021 with recurrent vomiting over the last couple of days. Noted to be hypoglycemic by EMS with blood glucose of 58, given an amp of D50.  Glucose later dropped in the ER was given another amp of D50.  Her  insulin pump was stopped.  She denies taking any long-acting insulin.  Her only other complaint on admission was dry cough recently, no fever, chills, SOB or wheezing.    Evaluation in the ED - chest xray concerning for RML opacity concerning for pneumonia, started on empiric IV antibiotics.  Labs showed hyperkalemia with K 6.0.  Started on IV fluids, D10w infusion and admitted to hospitalist service for further evaluation and management as outlined below.     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 Psychiatry consulted.  Rash of hand With right arm pain preceding onset of vesicular appearing rash on right palm, history of varicella in childhood, concerning for Shingles. --Continue acyclovir --Airborne and contact precautions --Swab vesicle fluid for culture and VZV  --Pain control as needed  Right arm pain Pt reports deep aching right arm pain for a couple of weeks prior to admission.  Started on gabapentin which has not helped.  Now with rash on her right palm.  (See rash A&P)  Type 1 diabetes mellitus with hypoglycemia without coma (HCC) Patient is very brittle diabetic, extremely sensitive to insulin.  Uses insulin pump at home but  recently her sensor stopped working.  She is having recurrent and refractory hypoglycemic episodes despite IV dextrose being given.  Possibly secondary to adrenal insufficiency (AM cortisol was normal) 12/16: PO intake improving  -- Diabetes coordinator following, appreciate input -- Stopped D10 infusion 12/15 afternoon with glucose in 200's on steroids --CBG's this AM 123>>107 --Resume D5-NS fluids to prevent hypoglycemia recurrence --Can stop fluids and monitor if her PO intake continues improving -- Hypoglycemia protocol --Stable for transfer to med/surg floor -- Continue Semglee 5 units daily-very sensitive sliding scale NovoLog q4h --Diabetes coordinator following  History of adrenal insufficiency Possibly contributing to her hyperkalemia and recurrent hypoglycemic episodes. Per chart review, patient has been followed by endocrinologist, Dr. Amalia Hailey, at [facility].  Last seen for follow-up of type 1 diabetes and adrenal insufficiency 04/02/2021.  Adrenal insufficiency appears to have occurred during long complicated admission during pregnancy.  Refer to care everywhere 04/02/2021 endocrinology encounter for more details. Patient had not been taking Cortef as of last follow-up. 12/14 A.m. cortisol within normal limits -- Given persistent hypoglycemia, started stress dose steroids --Call placed to patient's endocrinologist at [facility], awaiting call back --Will need close endocrine follow up after d/c.  Hyperkalemia Resolved.  Present on admission with K6.0>> 6.8 on 12/14. Treated with IV insulin + D50 and bicarb drip. K today down from 4.4>>3.4 -- Off bicarb drip -- Monitor BMP and treat as needed   CAP (community acquired pneumonia) Presented with dry cough and chest xray showing RML opacity.   Improved. No longer complaining  of respiratory symptoms. -- Continue empiric Rocephin and Zithromax --Monitor oxygenation and supplement O2 if SPO2 below 90% on room air -- Supportive care  Diabetic  gastroparesis (HCC) With recent nausea vomiting likely contributing to her hypoglycemia. Pt continues to have nausea/vomiting, taking in very little PO intake. Doing well on Reglan -- IV Reglan every 6 hours -- Zofran as needed -- Plan to d/c on PO Reglan  Peripheral neuropathy Continue gabapentin  Hypothyroidism Resumed on levothyroxine 25 mcg daily.  Patient follows with endocrinology at [facility].  Recent TSH mildly elevated 4.991 and free T4 normal at 5 on 04/27/2021     Subjective:  Pt feeling better today.  N/V better and she's tolerating oral intake now, able to eat more.  Continues to have pain in right hand and arm.  Otherwise denies acute complaints.   Objective Vitals:   05/08/21 1000 05/08/21 1100 05/08/21 1111 05/08/21 1200  BP: 111/65 118/67  (!) 113/59  Pulse: 80 80 81 87  Resp: 13 12  13   Temp:   98.4 F (36.9 C)   TempSrc:   Oral   SpO2: 98% 96% 99% 96%  Weight:       52.2 kg  Vital signs were reviewed and unremarkable.   Exam General exam: awake, alert, no acute distress HEENT: atraumatic, clear conjunctiva, anicteric sclera, moist mucus membranes, hearing grossly normal  Respiratory system: normal respiratory effort, on room air. Cardiovascular system: RRR, no pedal edema.   Central nervous system: A&O x3. no gross focal neurologic deficits, normal speech Extremities: Right palmar vesicular rash appears stable and unchanged since yesterday without any drainage seen, no edema, normal tone Psychiatry: normal mood, flat affect, judgement and insight appear normal   Labs / Other Information My review of labs, imaging, notes and other tests is significant for Potassium 3.4, glucose 222 on labs (CBGs 123, 107, 344 this afternoon), creatinine improved to 1.21, hemoglobin 8.0, procalcitonin trending down 0.2 to     Disposition Plan: Status is: Inpatient  Remains inpatient appropriate because: Severity of illness on IV therapies requiring close monitoring for  recurrent hypoglycemia     Time spent: 30 minutes [facility] 05/08/2021, 5:00 PM

## 2021-05-08 NOTE — Assessment & Plan Note (Signed)
Likely postpartum depression. Psychiatry consulted.

## 2021-05-08 NOTE — Assessment & Plan Note (Signed)
Possibly contributing to her hyperkalemia and recurrent hypoglycemic episodes. Per chart review, patient has been followed by endocrinologist, Dr. Amalia Hailey, at [facility].  Last seen for follow-up of type 1 diabetes and adrenal insufficiency 04/02/2021.  Adrenal insufficiency appears to have occurred during long complicated admission during pregnancy.  Refer to care everywhere 04/02/2021 endocrinology encounter for more details. Patient had not been taking Cortef as of last follow-up. 12/14 A.m. cortisol within normal limits -- Given persistent hypoglycemia, started stress dose steroids --Call placed to patient's endocrinologist at [facility], awaiting call back --Will need close endocrine follow up after d/c.

## 2021-05-08 NOTE — Assessment & Plan Note (Signed)
With recent nausea vomiting likely contributing to her hypoglycemia. Pt continues to have nausea/vomiting, taking in very little PO intake. Doing well on Reglan -- IV Reglan every 6 hours -- Zofran as needed -- Plan to d/c on PO Reglan

## 2021-05-08 NOTE — Assessment & Plan Note (Signed)
Continue gabapentin.

## 2021-05-08 NOTE — Assessment & Plan Note (Signed)
Patient is very brittle diabetic, extremely sensitive to insulin.  Uses insulin pump at home but recently her sensor stopped working.  She is having recurrent and refractory hypoglycemic episodes despite IV dextrose being given.  Possibly secondary to adrenal insufficiency (AM cortisol was normal) 12/16: PO intake improving  -- Diabetes coordinator following, appreciate input -- Stopped D10 infusion 12/15 afternoon with glucose in 200's on steroids --CBG's this AM 123>>107 --Resume D5-NS fluids to prevent hypoglycemia recurrence --Can stop fluids and monitor if her PO intake continues improving -- Hypoglycemia protocol --Stable for transfer to med/surg floor -- Continue Semglee 5 units daily-very sensitive sliding scale NovoLog q4h --Diabetes coordinator following

## 2021-05-08 NOTE — Assessment & Plan Note (Signed)
Resumed on levothyroxine 25 mcg daily.  Patient follows with endocrinology at [facility].  Recent TSH mildly elevated 4.991 and free T4 normal at 5 on 04/27/2021

## 2021-05-09 DIAGNOSIS — E039 Hypothyroidism, unspecified: Secondary | ICD-10-CM | POA: Diagnosis not present

## 2021-05-09 DIAGNOSIS — J189 Pneumonia, unspecified organism: Secondary | ICD-10-CM | POA: Diagnosis not present

## 2021-05-09 DIAGNOSIS — F331 Major depressive disorder, recurrent, moderate: Secondary | ICD-10-CM | POA: Diagnosis not present

## 2021-05-09 DIAGNOSIS — E1042 Type 1 diabetes mellitus with diabetic polyneuropathy: Secondary | ICD-10-CM | POA: Diagnosis not present

## 2021-05-09 DIAGNOSIS — E1043 Type 1 diabetes mellitus with diabetic autonomic (poly)neuropathy: Secondary | ICD-10-CM | POA: Diagnosis not present

## 2021-05-09 DIAGNOSIS — R21 Rash and other nonspecific skin eruption: Secondary | ICD-10-CM | POA: Diagnosis not present

## 2021-05-09 DIAGNOSIS — Z8639 Personal history of other endocrine, nutritional and metabolic disease: Secondary | ICD-10-CM | POA: Diagnosis not present

## 2021-05-09 DIAGNOSIS — E10649 Type 1 diabetes mellitus with hypoglycemia without coma: Secondary | ICD-10-CM | POA: Diagnosis not present

## 2021-05-09 LAB — BASIC METABOLIC PANEL
Anion gap: 4 — ABNORMAL LOW (ref 5–15)
BUN: 24 mg/dL — ABNORMAL HIGH (ref 6–20)
CO2: 30 mmol/L (ref 22–32)
Calcium: 7.9 mg/dL — ABNORMAL LOW (ref 8.9–10.3)
Chloride: 108 mmol/L (ref 98–111)
Creatinine, Ser: 1.09 mg/dL — ABNORMAL HIGH (ref 0.44–1.00)
GFR, Estimated: >60 mL/min (ref >60)
Glucose, Bld: 147 mg/dL — ABNORMAL HIGH (ref 70–99)
Potassium: 3.6 mmol/L (ref 3.5–5.1)
Sodium: 142 mmol/L (ref 135–145)

## 2021-05-09 LAB — GLUCOSE, CAPILLARY
Glucose-Capillary: 184 mg/dL — ABNORMAL HIGH (ref 70–99)
Glucose-Capillary: 171 mg/dL — ABNORMAL HIGH (ref 70–99)
Glucose-Capillary: 172 mg/dL — ABNORMAL HIGH (ref 70–99)
Glucose-Capillary: 225 mg/dL — ABNORMAL HIGH (ref 70–99)
Glucose-Capillary: 205 mg/dL — ABNORMAL HIGH (ref 70–99)
Glucose-Capillary: 94 mg/dL (ref 70–99)

## 2021-05-09 LAB — MAGNESIUM: Magnesium: 2 mg/dL (ref 1.7–2.4)

## 2021-05-09 LAB — CBC
HCT: 23.3 % — ABNORMAL LOW (ref 36.0–46.0)
Hemoglobin: 7.9 g/dL — ABNORMAL LOW (ref 12.0–15.0)
MCH: 29.6 pg (ref 26.0–34.0)
MCHC: 33.9 g/dL (ref 30.0–36.0)
MCV: 87.3 fL (ref 80.0–100.0)
Platelets: 144 10*3/uL — ABNORMAL LOW (ref 150–400)
RBC: 2.67 MIL/uL — ABNORMAL LOW (ref 3.87–5.11)
RDW: 13.4 % (ref 11.5–15.5)
WBC: 4.8 10*3/uL (ref 4.0–10.5)
nRBC: 0 % (ref 0.0–0.2)

## 2021-05-09 MED ORDER — HYDROCORTISONE 10 MG PO TABS
10.0000 mg | ORAL_TABLET | Freq: Two times a day (BID) | ORAL | Status: DC
  Administered 2021-05-09: 10 mg via ORAL
  Filled 2021-05-09 (×2): qty 1

## 2021-05-09 MED ORDER — HYDROCORTISONE 10 MG PO TABS
10.0000 mg | ORAL_TABLET | Freq: Two times a day (BID) | ORAL | Status: DC
Start: 2021-05-09 — End: 2021-05-09

## 2021-05-09 MED ORDER — METOCLOPRAMIDE HCL 5 MG PO TABS
10.0000 mg | ORAL_TABLET | Freq: Three times a day (TID) | ORAL | Status: DC
  Administered 2021-05-09 – 2021-05-10 (×4): 10 mg via ORAL
  Filled 2021-05-09 (×2): qty 2
  Filled 2021-05-09 (×2): qty 1
  Filled 2021-05-09: qty 2
  Filled 2021-05-09: qty 1
  Filled 2021-05-09: qty 2

## 2021-05-09 NOTE — Assessment & Plan Note (Signed)
Patient is very brittle diabetic, extremely sensitive to insulin.  Uses insulin pump at home but recently her sensor stopped working.  She is having recurrent and refractory hypoglycemic episodes despite IV dextrose being given.  Possibly secondary to adrenal insufficiency (AM cortisol was normal) 12/16-17: PO intake improving and sugars more stable in the 100-200s -- Diabetes coordinator following, appreciate input --Required D10 infusion, off since 12/15  --Resumed on D5-NS due to sugars in the low 100's to prevent recurrent hypoglycemia --Stop IV fluids --Resume on D5 if hypoglycemia recurs --Steroids have been reduced, monitor closely -- Hypoglycemia protocol --Stable for transfer to med/surg floor -- Continue Semglee 5 units daily-very sensitive sliding scale NovoLog q4h

## 2021-05-09 NOTE — Assessment & Plan Note (Signed)
Likely postpartum depression. Psychiatry consulted. Started on mirtazapine for mood appetite and sleep. Outpatient follow-up with [facility] mental [facility].

## 2021-05-09 NOTE — Assessment & Plan Note (Signed)
Presented with dry cough and chest xray showing RML opacity.  Never required oxygen. Improved. No longer complaining of respiratory symptoms. -- Completed course of Rocephin and Zithromax

## 2021-05-09 NOTE — Assessment & Plan Note (Signed)
Resolved.  Present on admission with K6.0>> 6.8 on 12/14. Treated with IV insulin + D50 and bicarb drip. K today down from 4.4>>3.4>>3.6 -- Off bicarb drip -- Monitor BMP and treat as needed

## 2021-05-09 NOTE — Progress Notes (Signed)
Patient's boyfriend Sondra Come notified via phone of her transfer from ICU to room 128 per request. Patient also instructed RN to provide [facility] with password.

## 2021-05-09 NOTE — Assessment & Plan Note (Signed)
Possibly contributing to her hyperkalemia and recurrent hypoglycemic episodes. Per chart review, patient has been followed by endocrinologist, Dr. Amalia Hailey, at [facility].  Last seen for follow-up of type 1 diabetes and adrenal insufficiency 04/02/2021.  Adrenal insufficiency appears to have occurred during long complicated admission during pregnancy.  Refer to care everywhere 04/02/2021 endocrinology encounter for more details. Patient had not been taking Cortef as of last follow-up. 12/14 A.m. cortisol within normal limits -- Given persistent hypoglycemia, started stress dose IV steroids --Transition to oral Cortef 10 mg twice daily for now --Monitor blood sugars closely with reduced steroid dose --Call placed to patient's endocrinologist at [facility], have not received call back --Close endocrine follow up after d/c.

## 2021-05-09 NOTE — Assessment & Plan Note (Signed)
Pt reports deep aching right arm pain for a couple of weeks prior to admission.  Started on gabapentin which has not helped.  Now with rash on her right palm.  (See rash A&P)

## 2021-05-09 NOTE — Assessment & Plan Note (Addendum)
With recent nausea vomiting likely contributing to her hypoglycemia. Nausea and vomiting have resolved and patient now tolerating oral intake. Doing well on Reglan -- Transition IV to p.o. Reglan AC/HS -- Zofran as needed -- Plan to d/c on PO Reglan

## 2021-05-09 NOTE — Assessment & Plan Note (Signed)
Resumed on levothyroxine 25 mcg daily.  Patient follows with endocrinology at [facility].  Recent TSH mildly elevated 4.991 and free T4 normal at 5 on 04/27/2021

## 2021-05-09 NOTE — Progress Notes (Signed)
Progress Note    Ashley Camacho   VQQ:[id number]  DOB: 1990/06/18  DOA: 05/05/2021     4 Date of Service: 05/09/2021   Brief narrative Ashley Camacho is a 30 y.o. Caucasian female with medical history significant for type 1 diabetes mellitus on insulin pump, hypertension diabetic gastroparesis and recent observation here for hypoglycemia on 12/5 that were thought be related to taking Lantus as she was not able to use her insulin pump due to lack of insulin.  She presented to the ED on 05/05/2021 with recurrent vomiting over the last couple of days. Noted to be hypoglycemic by EMS with blood glucose of 58, given an amp of D50.  Glucose later dropped in the ER was given another amp of D50.  Her  insulin pump was stopped.  She denies taking any long-acting insulin.  Her only other complaint on admission was dry cough recently, no fever, chills, SOB or wheezing.    Evaluation in the ED - chest xray concerning for RML opacity concerning for pneumonia, started on empiric IV antibiotics.  Labs showed hyperkalemia with K 6.0.  Started on IV fluids, D10w infusion and admitted to hospitalist service for further evaluation and management as outlined below.     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 Psychiatry consulted. Started on mirtazapine for mood appetite and sleep. Outpatient follow-up with [facility].  Rash of hand With right arm pain preceding onset of vesicular appearing rash on right palm, history of varicella in childhood, concerning for Shingles. 12/17: Rash appears to be in the drying phase, vesicles shrinking, no weeping, erythema beginning to recede --Continue acyclovir --Airborne and contact precautions --Swab vesicle fluid for culture and VZV  --Pain control as needed  Right arm pain Pt reports deep aching right arm pain for a couple of weeks prior to admission.  Started on gabapentin which has not helped.  Now  with rash on her right palm.  (See rash A&P)  Type 1 diabetes mellitus with hypoglycemia without coma (HCC) Patient is very brittle diabetic, extremely sensitive to insulin.  Uses insulin pump at home but recently her sensor stopped working.  She is having recurrent and refractory hypoglycemic episodes despite IV dextrose being given.  Possibly secondary to adrenal insufficiency (AM cortisol was normal) 12/16-17: PO intake improving and sugars more stable in the 100-200s -- Diabetes coordinator following, appreciate input --Required D10 infusion, off since 12/15  --Resumed on D5-NS due to sugars in the low 100's to prevent recurrent hypoglycemia --Stop IV fluids --Resume on D5 if hypoglycemia recurs --Steroids have been reduced, monitor closely -- Hypoglycemia protocol --Stable for transfer to med/surg floor -- Continue Semglee 5 units daily-very sensitive sliding scale NovoLog q4h   History of adrenal insufficiency Possibly contributing to her hyperkalemia and recurrent hypoglycemic episodes. Per chart review, patient has been followed by endocrinologist, Dr. Amalia Hailey, at [facility].  Last seen for follow-up of type 1 diabetes and adrenal insufficiency 04/02/2021.  Adrenal insufficiency appears to have occurred during long complicated admission during pregnancy.  Refer to care everywhere 04/02/2021 endocrinology encounter for more details. Patient had not been taking Cortef as of last follow-up. 12/14 A.m. cortisol within normal limits -- Given persistent hypoglycemia, started stress dose IV steroids --Transition to oral Cortef 10 mg twice daily for now --Monitor blood sugars closely with reduced steroid dose --Call placed to patient's endocrinologist at [facility], have not received call back --Close endocrine follow up after d/c.  Hyperkalemia Resolved.  Present on admission with K6.0>> 6.8 on 12/14. Treated with IV insulin + D50 and bicarb drip. K today down from 4.4>>3.4>>3.6 -- Off bicarb drip --  Monitor BMP and treat as needed   CAP (community acquired pneumonia) Presented with dry cough and chest xray showing RML opacity.  Never required oxygen. Improved. No longer complaining of respiratory symptoms. -- Completed course of Rocephin and Zithromax  Diabetic gastroparesis (HCC) With recent nausea vomiting likely contributing to her hypoglycemia. Nausea and vomiting have resolved and patient now tolerating oral intake. Doing well on Reglan -- Transition IV to p.o. Reglan AC/HS -- Zofran as needed -- Plan to d/c on PO Reglan  Peripheral neuropathy Continue gabapentin  Hypothyroidism Resumed on levothyroxine 25 mcg daily.  Patient follows with endocrinology at [facility].  Recent TSH mildly elevated 4.991 and free T4 normal at 5 on 04/27/2021     Subjective:  Patient awake resting in bed when seen in ICU today.  She is awaiting transfer to the floor.  She reports sleeping well, appetite improved, no longer having nausea vomiting.  Says as when she might be discharged from the hospital and needs to be home by Wednesday.  She denies other acute complaints.  Still having some hand pain but controlled with medication.  Objective Vitals:   05/09/21 0800 05/09/21 0900 05/09/21 1000 05/09/21 1100  BP: 120/80     Pulse:  81 76 81  Resp: 14 14 14 15   Temp: 98.7 F (37.1 C)     TempSrc:      SpO2:  98% 98% 100%  Weight:       52.2 kg  Vital signs were reviewed and unremarkable.   Exam General exam: awake, alert, no acute distress HEENT: atraumatic, clear conjunctiva, anicteric sclera, moist mucus membranes, hearing grossly normal  Respiratory system: CTAB no wheezes, rales or rhonchi, normal respiratory effort. Cardiovascular system: normal S1/S2, RRR, no JVD, murmurs, rubs, gallops, no pedal edema.   Central nervous system: A&O x3. no gross focal neurologic deficits, normal speech Extremities: Right hand vesicular rash beginning to dry up and erythema beginning to recede no  weeping seen Psychiatry: normal mood, congruent affect, judgement and insight appear normal   Labs / Other Information My review of labs, imaging, notes and other tests is significant for Stable electrolytes.  Creatinine improved to 1.21 down to 1.09, BUN up slightly at 24.  CBGs so far this morning 171, 172, 225.  Hemoglobin 7.9, platelets improved from 133 up to 140 4K.     Disposition Plan: Status is: Inpatient  Remains inpatient appropriate because: Warrants further close monitoring with transition from IV to lower dose oral steroids and now off dextrose fluids.  Very brittle diabetes with severe hypoglycemic episodes, continue monitoring closely with hopeful discharge next 24 to 48 hours of stable.    Time spent: 30 minutes [facility] 05/09/2021, 2:34 PM

## 2021-05-09 NOTE — Assessment & Plan Note (Signed)
With right arm pain preceding onset of vesicular appearing rash on right palm, history of varicella in childhood, concerning for Shingles. 12/17: Rash appears to be in the drying phase, vesicles shrinking, no weeping, erythema beginning to recede --Continue acyclovir --Airborne and contact precautions --Swab vesicle fluid for culture and VZV  --Pain control as needed

## 2021-05-09 NOTE — Assessment & Plan Note (Signed)
Continue gabapentin.

## 2021-05-09 NOTE — Progress Notes (Signed)
Patient arrived to room via bed from ICU in stable condition.

## 2021-05-09 NOTE — Progress Notes (Signed)
0800 Talked in detail about new baby. Patient has very flat affect. Never smiles or has any facial expressions.Mother does not communicate with her. She speaks softly and does not converse unless she is asked questions. Only wants to sleep. Encouraged to get up in chair and move about room-patient not interested. Has a 30 year old at home. States she expects no visitors today.

## 2021-05-09 NOTE — Progress Notes (Signed)
Late note 1530 Report called to Tiffanie RN on 1C. 1555 Patient moved via bed to room 128.

## 2021-05-10 DIAGNOSIS — J189 Pneumonia, unspecified organism: Secondary | ICD-10-CM | POA: Diagnosis not present

## 2021-05-10 DIAGNOSIS — E039 Hypothyroidism, unspecified: Secondary | ICD-10-CM | POA: Diagnosis not present

## 2021-05-10 DIAGNOSIS — E1043 Type 1 diabetes mellitus with diabetic autonomic (poly)neuropathy: Secondary | ICD-10-CM | POA: Diagnosis not present

## 2021-05-10 DIAGNOSIS — F331 Major depressive disorder, recurrent, moderate: Secondary | ICD-10-CM | POA: Diagnosis not present

## 2021-05-10 DIAGNOSIS — R21 Rash and other nonspecific skin eruption: Secondary | ICD-10-CM | POA: Diagnosis not present

## 2021-05-10 DIAGNOSIS — E10649 Type 1 diabetes mellitus with hypoglycemia without coma: Secondary | ICD-10-CM | POA: Diagnosis not present

## 2021-05-10 DIAGNOSIS — E1042 Type 1 diabetes mellitus with diabetic polyneuropathy: Secondary | ICD-10-CM | POA: Diagnosis not present

## 2021-05-10 DIAGNOSIS — Z8639 Personal history of other endocrine, nutritional and metabolic disease: Secondary | ICD-10-CM | POA: Diagnosis not present

## 2021-05-10 LAB — CBC
HCT: 23.9 % — ABNORMAL LOW (ref 36.0–46.0)
Hemoglobin: 7.9 g/dL — ABNORMAL LOW (ref 12.0–15.0)
MCH: 29.6 pg (ref 26.0–34.0)
MCHC: 33.1 g/dL (ref 30.0–36.0)
MCV: 89.5 fL (ref 80.0–100.0)
Platelets: 139 10*3/uL — ABNORMAL LOW (ref 150–400)
RBC: 2.67 MIL/uL — ABNORMAL LOW (ref 3.87–5.11)
RDW: 13.3 % (ref 11.5–15.5)
WBC: 4.7 10*3/uL (ref 4.0–10.5)
nRBC: 0 % (ref 0.0–0.2)

## 2021-05-10 LAB — BASIC METABOLIC PANEL
Anion gap: 3 — ABNORMAL LOW (ref 5–15)
BUN: 25 mg/dL — ABNORMAL HIGH (ref 6–20)
CO2: 30 mmol/L (ref 22–32)
Calcium: 7.9 mg/dL — ABNORMAL LOW (ref 8.9–10.3)
Chloride: 107 mmol/L (ref 98–111)
Creatinine, Ser: 1.14 mg/dL — ABNORMAL HIGH (ref 0.44–1.00)
GFR, Estimated: >60 mL/min (ref >60)
Glucose, Bld: 294 mg/dL — ABNORMAL HIGH (ref 70–99)
Potassium: 4 mmol/L (ref 3.5–5.1)
Sodium: 140 mmol/L (ref 135–145)

## 2021-05-10 LAB — CULTURE, BLOOD (ROUTINE X 2)
Culture: NO GROWTH
Culture: NO GROWTH
Special Requests: ADEQUATE

## 2021-05-10 LAB — GLUCOSE, CAPILLARY
Glucose-Capillary: 119 mg/dL — ABNORMAL HIGH (ref 70–99)
Glucose-Capillary: 136 mg/dL — ABNORMAL HIGH (ref 70–99)
Glucose-Capillary: 195 mg/dL — ABNORMAL HIGH (ref 70–99)
Glucose-Capillary: 195 mg/dL — ABNORMAL HIGH (ref 70–99)
Glucose-Capillary: 257 mg/dL — ABNORMAL HIGH (ref 70–99)

## 2021-05-10 MED ORDER — ACYCLOVIR 200 MG PO CAPS
800.0000 mg | ORAL_CAPSULE | Freq: Every day | ORAL | Status: DC
  Filled 2021-05-10 (×2): qty 4

## 2021-05-10 MED ORDER — HYDROCODONE-ACETAMINOPHEN 5-325 MG PO TABS: 1.0000 | ORAL_TABLET | Freq: Four times a day (QID) | ORAL | 0 refills | Status: DC | PRN

## 2021-05-10 MED ORDER — GABAPENTIN 100 MG PO CAPS: 200.0000 mg | ORAL_CAPSULE | Freq: Three times a day (TID) | ORAL | 0 refills | Status: DC

## 2021-05-10 MED ORDER — ADULT MULTIVITAMIN W/MINERALS CH: 1.0000 | ORAL_TABLET | Freq: Every day | ORAL | Status: DC

## 2021-05-10 MED ORDER — ACYCLOVIR 200 MG PO CAPS
800.0000 mg | ORAL_CAPSULE | Freq: Every day | ORAL | 0 refills | Status: AC
Start: 2021-05-10 — End: 2021-05-13

## 2021-05-10 MED ORDER — LEVOTHYROXINE SODIUM 25 MCG PO TABS: 25.0000 ug | ORAL_TABLET | Freq: Every day | ORAL | 3 refills | Status: DC

## 2021-05-10 MED ORDER — GLUCERNA SHAKE PO LIQD: 237.0000 mL | Freq: Three times a day (TID) | ORAL | 0 refills | Status: DC

## 2021-05-10 MED ORDER — METOCLOPRAMIDE HCL 10 MG PO TABS: 10.0000 mg | ORAL_TABLET | Freq: Three times a day (TID) | ORAL | 1 refills | Status: DC

## 2021-05-10 MED ORDER — HYDROCORTISONE 5 MG PO TABS
5.0000 mg | ORAL_TABLET | Freq: Two times a day (BID) | ORAL | Status: DC
  Administered 2021-05-10 (×2): 5 mg via ORAL
  Filled 2021-05-10 (×3): qty 1

## 2021-05-10 MED ORDER — IBUPROFEN 600 MG PO TABS
600.0000 mg | ORAL_TABLET | Freq: Four times a day (QID) | ORAL | 0 refills | Status: DC | PRN
Start: 2021-05-10 — End: 2021-05-25

## 2021-05-10 MED ORDER — HYDROCORTISONE 5 MG PO TABS: 5.0000 mg | ORAL_TABLET | Freq: Two times a day (BID) | ORAL | 1 refills | Status: DC

## 2021-05-10 MED ORDER — MIRTAZAPINE 7.5 MG PO TABS: 7.5000 mg | ORAL_TABLET | Freq: Every day | ORAL | 2 refills | Status: DC

## 2021-05-10 NOTE — Discharge Summary (Signed)
Physician Discharge Summary   Patient name: Ashley Camacho  Admit date:     05/05/2021  Discharge date: 05/10/2021  Discharge Physician: Pennie Banter   PCP: Pcp, No   Recommendations at discharge:   Close follow up with endocrinology. Follow up with PCP in 1-2 weeks. Follow up with [facility].   Discharge Diagnoses Principal Problem:   Moderate recurrent major depression (HCC) Active Problems:   Right arm pain   Rash of hand   Type 1 diabetes mellitus with hypoglycemia without coma (HCC)   Hyperkalemia   History of adrenal insufficiency   CAP (community acquired pneumonia)   Diabetic gastroparesis [facility])   Peripheral neuropathy   Hypothyroidism    Hospital Course   Ashley Camacho is a 30 y.o. Caucasian female with medical history significant for type 1 diabetes mellitus on insulin pump, hypertension diabetic gastroparesis and recent observation here for hypoglycemia on 12/5 that were thought be related to taking Lantus as she was not able to use her insulin pump due to lack of insulin.  She presented to the ED on 05/05/2021 with recurrent vomiting over the last couple of days. Noted to be hypoglycemic by EMS with blood glucose of 58, given an amp of D50.  Glucose later dropped in the ER was given another amp of D50.  Her  insulin pump was stopped.  She denies taking any long-acting insulin.  Her only other complaint on admission was dry cough recently, no fever, chills, SOB or wheezing.    Evaluation in the ED - chest xray concerning for RML opacity concerning for pneumonia, started on empiric IV antibiotics.  Labs showed hyperkalemia with K 6.0.  Started on IV fluids, D10w infusion and admitted to hospitalist service for further evaluation and management as outlined below.      Type 1 diabetes mellitus with hypoglycemia without coma (HCC) Patient is very brittle diabetic, extremely sensitive to insulin.  Uses insulin pump at home but recently her sensor stopped  working.  She is having recurrent and refractory hypoglycemic episodes despite IV dextrose being given.  Possibly secondary to adrenal insufficiency (AM cortisol was normal) 12/16-18: PO intake improving and sugars more stable in the 100-200s -- Diabetes coordinator following, appreciate input --Required D10 infusion, off since 12/15  --Resumed on D5-NS due to sugars in the low 100's to prevent recurrent hypoglycemia --Off IV fluids --Steroids have been reduced, monitor closely -- Treated with Semglee 5 units daily-very sensitive sliding scale NovoLog q4h --Resume insulin pump at discharge --Close endocrinology follow up after discharge     History of adrenal insufficiency Possibly contributing to her hyperkalemia and recurrent hypoglycemic episodes. Per chart review, patient has been followed by endocrinologist, Dr. Amalia Hailey, at [facility].  Last seen for follow-up of type 1 diabetes and adrenal insufficiency 04/02/2021.  Adrenal insufficiency appears to have occurred during long complicated admission during pregnancy.  Refer to care everywhere 04/02/2021 endocrinology encounter for more details. Patient had not been taking Cortef as of last follow-up. 12/14 A.m. cortisol within normal limits -- Given persistent hypoglycemia, started stress dose IV steroids --Transitioned to oral Cortef 10 mg twice daily for now --Blood sugars stable on reduced steroid dose --Call placed to patient's endocrinologist at [facility], have not received call back --Close endocrine follow up after d/c.     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 Psychiatry consulted. Started on mirtazapine for mood appetite and sleep. Outpatient follow-up with [facility].   Rash of hand  With right arm pain preceding onset of vesicular appearing rash on right palm, history of varicella in childhood, concerning for Shingles. 12/17-18: Rash appears to be in the drying phase, vesicles shrinking, no  weeping, erythema beginning to recede --Continue acyclovir --Placed on airborne and contact precautions --Pain control as needed Patient advised to wear glove on hand at home until any visible vesicles have dried and resolved. PCP follow up to monitor resolution   Right arm pain Pt reports deep aching right arm pain for a couple of weeks prior to admission.  Started on gabapentin which has not helped.  Now with rash on her right palm.  (See rash A&P), suspect this is related to shingles.  No swelling or erythema of the arm.  No hx of trauma reported.  Monitor for improvement with supportive care and treat Shingles.   Hyperkalemia Resolved.  ?if due to adrenal insufficiency but cortisol levels were normal. Present on admission with K6.0>> 6.8 on 12/14. Treated with IV insulin + D50 and bicarb drip. -- Monitor BMP and treat as needed     CAP (community acquired pneumonia) Presented with dry cough and chest xray showing RML opacity.  Never required oxygen. Improved. No longer complaining of respiratory symptoms. -- Completed course of Rocephin and Zithromax   Diabetic gastroparesis (HCC) With recent nausea vomiting likely contributing to her hypoglycemia. Nausea and vomiting have resolved and patient now tolerating oral intake. Doing well on Reglan -- Transitioned from IV to p.o. Reglan AC/HS -- Zofran as needed -- Will continue PO Reglan at discharge, tolerating well   Peripheral neuropathy Continue gabapentin   Hypothyroidism Resumed on levothyroxine 25 mcg daily.  Patient follows with endocrinology at [facility].  Recent TSH mildly elevated 4.991 and free T4 normal at 5 on 04/27/2021         Procedures performed: None   Condition at discharge: stable  Exam Physical Exam  General exam: awake, alert, no acute distress HEENT: atraumatic, clear conjunctiva, anicteric sclera, moist mucus membranes, hearing grossly normal  Respiratory system: CTAB, no wheezes, rales or rhonchi,  normal respiratory effort. Cardiovascular system: normal S1/S2,  RRR, no JVD, murmurs, rubs, gallops,  no pedal edema.   Gastrointestinal system: soft, NT, ND, no HSM felt, +bowel sounds. Central nervous system: A&O x3. no gross focal neurologic deficits, normal speech Extremities: Right hand rash improving, vesicles in drying phase, no draining, erythema receding, no edema, normal tone Psychiatry: normal mood, congruent affect, judgement and insight appear normal   Disposition: Home  Discharge time: greater than 30 minutes.   Allergies as of 05/10/2021   No Known Allergies      Medication List     TAKE these medications    Acetaminophen Extra Strength 500 MG tablet Generic drug: acetaminophen Take 2 tablets (1,000 mg total) by mouth every 6 (six) hours as needed for headache, fever or moderate pain.   acyclovir 200 MG capsule Commonly known as: ZOVIRAX Take 4 capsules (800 mg total) by mouth 5 (five) times daily for 16 doses.   feeding supplement (GLUCERNA SHAKE) Liqd Take 237 mLs by mouth 3 (three) times daily between meals.   gabapentin 100 MG capsule Commonly known as: NEURONTIN Take 2 capsules (200 mg total) by mouth 3 (three) times daily. What changed:  how much to take when to take this   HYDROcodone-acetaminophen 5-325 MG tablet Commonly known as: NORCO/VICODIN Take 1-2 tablets by mouth every 6 (six) hours as needed for moderate pain or severe pain.   hydrocortisone 5  MG tablet Commonly known as: CORTEF Take 1 tablet (5 mg total) by mouth 2 (two) times daily.   ibuprofen 600 MG tablet Commonly known as: ADVIL Take 1 tablet (600 mg total) by mouth every 6 (six) hours as needed for fever, headache or mild pain. What changed:  when to take this reasons to take this   levothyroxine 25 MCG tablet Commonly known as: SYNTHROID Take 1 tablet (25 mcg total) by mouth daily at 6 (six) AM. Start taking on: May 11, 2021   metoCLOPramide 10 MG  tablet Commonly known as: REGLAN Take 1 tablet (10 mg total) by mouth 4 (four) times daily -  before meals and at bedtime.   mirtazapine 7.5 MG tablet Commonly known as: REMERON Take 1 tablet (7.5 mg total) by mouth at bedtime.   multivitamin with minerals Tabs tablet Take 1 tablet by mouth daily. Start taking on: May 11, 2021   NovoLOG FlexPen 100 UNIT/ML FlexPen Generic drug: insulin aspart Inject 2 Units into the skin 3 (three) times daily with meals Inject 0-6 Units into the skin every 4 (four) hours.        CT Head Wo Contrast  Result Date: 04/27/2021 CLINICAL DATA:  Decreased responsiveness.  Hypoglycemia. EXAM: CT HEAD WITHOUT CONTRAST TECHNIQUE: Contiguous axial images were obtained from the base of the skull through the vertex without intravenous contrast. COMPARISON:  None. FINDINGS: Brain: The brain shows a normal appearance without evidence of malformation, atrophy, old or acute small or large vessel infarction, mass lesion, hemorrhage, hydrocephalus or extra-axial collection. Vascular: No hyperdense vessel. No evidence of atherosclerotic calcification. Skull: Normal.  No traumatic finding.  No focal bone lesion. Sinuses/Orbits: Sinuses are clear. Orbits appear normal. Mastoids are clear. Other: None significant IMPRESSION: Normal head CT. Electronically Signed   By: Paulina Fusi M.D.   On: 04/27/2021 08:42   US Venous Img Upper Uni Right(DVT)  Result Date: 04/28/2021 CLINICAL DATA:  Right upper extremity pain for the past day. History of right upper extremity DVT. EXAM: RIGHT UPPER EXTREMITY VENOUS DOPPLER ULTRASOUND TECHNIQUE: Gray-scale sonography with graded compression, as well as color Doppler and duplex ultrasound were performed to evaluate the upper extremity deep venous system from the level of the subclavian vein and including the jugular, axillary, basilic, radial, ulnar and upper cephalic vein. Spectral Doppler was utilized to evaluate flow at rest and with  distal augmentation maneuvers. COMPARISON:  None. FINDINGS: Contralateral Subclavian Vein: Respiratory phasicity is normal and symmetric with the symptomatic side. No evidence of thrombus. Normal compressibility. Internal Jugular Vein: No evidence of thrombus. Normal compressibility, respiratory phasicity and response to augmentation. Subclavian Vein: No evidence of thrombus. Normal compressibility, respiratory phasicity and response to augmentation. Axillary Vein: No evidence of thrombus. Normal compressibility, respiratory phasicity and response to augmentation. Cephalic Vein: No evidence of thrombus. Normal compressibility, respiratory phasicity and response to augmentation. Basilic Vein: No evidence of thrombus. Normal compressibility, respiratory phasicity and response to augmentation. Brachial Veins: No evidence of thrombus within either of the paired brachial veins. Normal compressibility, respiratory phasicity and response to augmentation. Radial Veins: No evidence of thrombus. Normal compressibility, respiratory phasicity and response to augmentation. Ulnar Veins: No evidence of thrombus. Normal compressibility, respiratory phasicity and response to augmentation. Other Findings:  None visualized. IMPRESSION: No evidence of acute or chronic DVT within the right upper extremity. Electronically Signed   By: Simonne Come M.D.   On: 04/28/2021 10:16   DG Chest Portable 1 View  Result Date: 05/05/2021 CLINICAL DATA:  AMS, hypoglycemia EXAM: PORTABLE CHEST 1 VIEW COMPARISON:  Radiograph 04/27/2021 FINDINGS: Unchanged cardiomediastinal silhouette. There is a small right midlung opacity new from prior exam. No large pleural effusion. No visible pneumothorax. No acute osseous abnormality. IMPRESSION: Small right midlung opacity new from prior exam, could be developing infection. Electronically Signed   By: Caprice Renshaw M.D.   On: 05/05/2021 12:36   DG Chest Portable 1 View  Result Date: 04/27/2021 CLINICAL  DATA:  Weakness EXAM: PORTABLE CHEST 1 VIEW COMPARISON:  02/21/2021 FINDINGS: Previously seen PICC line has been removed. The heart size and mediastinal contours are within normal limits. Both lungs are clear. The visualized skeletal structures are unremarkable. IMPRESSION: No active disease. Electronically Signed   By: Duanne Guess D.O.   On: 04/27/2021 08:53   [place] EKG SITE RITE  Result Date: 05/07/2021 If Site Rite image not attached, placement could not be confirmed due to current cardiac rhythm.  [place] EKG SITE RITE  Result Date: 05/07/2021 If Site Rite image not attached, placement could not be confirmed due to current cardiac rhythm.  Results for orders placed or performed during the hospital encounter of 05/05/21  Resp Panel by RT-PCR (Flu A&B, Covid) Nasopharyngeal Swab     Status: None   Collection Time: 05/05/21 12:08 PM   Specimen: Nasopharyngeal Swab; Nasopharyngeal(NP) swabs in vial transport medium  Result Value Ref Range Status   SARS Coronavirus 2 by RT PCR NEGATIVE NEGATIVE Final    Comment: (NOTE) SARS-CoV-2 target nucleic acids are NOT DETECTED.  The SARS-CoV-2 RNA is generally detectable in upper respiratory specimens during the acute phase of infection. The lowest concentration of SARS-CoV-2 viral copies this assay can detect is 138 copies/mL. A negative result does not preclude SARS-Cov-2 infection and should not be used as the sole basis for treatment or other patient management decisions. A negative result may occur with  improper specimen collection/handling, submission of specimen other than nasopharyngeal swab, presence of viral mutation(s) within the areas targeted by this assay, and inadequate number of viral copies(<138 copies/mL). A negative result must be combined with clinical observations, patient history, and epidemiological information. The expected result is Negative.  Fact Sheet for Patients:  BloggerCourse.com  Fact  Sheet for Healthcare Providers:  SeriousBroker.it  This test is no t yet approved or cleared by the [place] FDA and  has been authorized for detection and/or diagnosis of SARS-CoV-2 by FDA under an Emergency Use Authorization (EUA). This EUA will remain  in effect (meaning this test can be used) for the duration of the COVID-19 declaration under Section 564(b)(1) of the Act, 21 U.S.C.section 360bbb-3(b)(1), unless the authorization is terminated  or revoked sooner.       Influenza A by PCR NEGATIVE NEGATIVE Final   Influenza B by PCR NEGATIVE NEGATIVE Final    Comment: (NOTE) The Xpert Xpress SARS-CoV-2/FLU/RSV plus assay is intended as an aid in the diagnosis of influenza from Nasopharyngeal swab specimens and should not be used as a sole basis for treatment. Nasal washings and aspirates are unacceptable for Xpert Xpress SARS-CoV-2/FLU/RSV testing.  Fact Sheet for Patients: BloggerCourse.com  Fact Sheet for Healthcare Providers: SeriousBroker.it  This test is not yet approved or cleared by the [place] FDA and has been authorized for detection and/or diagnosis of SARS-CoV-2 by FDA under an Emergency Use Authorization (EUA). This EUA will remain in effect (meaning this test can be used) for the duration of the COVID-19 declaration under Section 564(b)(1) of the Act, 21 U.S.C. section  360bbb-3(b)(1), unless the authorization is terminated or revoked.  Performed at [facility], [address] [place], [place] [address]   Culture, blood (routine x 2)     Status: None   Collection Time: 05/05/21  1:10 PM   Specimen: BLOOD  Result Value Ref Range Status   Specimen Description BLOOD BLOOD LEFT FOREARM  Final   Special Requests   Final    BOTTLES DRAWN AEROBIC AND ANAEROBIC Blood Culture adequate volume   Culture   Final    NO GROWTH 5 DAYS Performed at [facility], [address] [place], [place] [address]    Report Status 05/10/2021 FINAL  Final  Culture, blood (routine x 2)     Status: None   Collection Time: 05/05/21  2:30 PM   Specimen: BLOOD  Result Value Ref Range Status   Specimen Description BLOOD BLOOD LEFT HAND  Final   Special Requests   Final    BOTTLES DRAWN AEROBIC AND ANAEROBIC Blood Culture results may not be optimal due to an inadequate volume of blood received in culture bottles   Culture   Final    NO GROWTH 5 DAYS Performed at [facility], [address] [place], [place] [address]    Report Status 05/10/2021 FINAL  Final    Signed:  Pennie Banter MD.  [facility] 05/10/2021, 10:10 AM

## 2021-05-19 ENCOUNTER — Encounter: Payer: Self-pay | Admitting: Emergency Medicine

## 2021-05-19 ENCOUNTER — Other Ambulatory Visit: Payer: Self-pay

## 2021-05-19 DIAGNOSIS — Z833 Family history of diabetes mellitus: Secondary | ICD-10-CM

## 2021-05-19 DIAGNOSIS — Z794 Long term (current) use of insulin: Secondary | ICD-10-CM

## 2021-05-19 DIAGNOSIS — N179 Acute kidney failure, unspecified: Secondary | ICD-10-CM | POA: Diagnosis present

## 2021-05-19 DIAGNOSIS — E1042 Type 1 diabetes mellitus with diabetic polyneuropathy: Secondary | ICD-10-CM | POA: Diagnosis present

## 2021-05-19 DIAGNOSIS — N1831 Chronic kidney disease, stage 3a: Secondary | ICD-10-CM | POA: Diagnosis present

## 2021-05-19 DIAGNOSIS — E1010 Type 1 diabetes mellitus with ketoacidosis without coma: Principal | ICD-10-CM | POA: Diagnosis present

## 2021-05-19 DIAGNOSIS — E1022 Type 1 diabetes mellitus with diabetic chronic kidney disease: Secondary | ICD-10-CM | POA: Diagnosis present

## 2021-05-19 DIAGNOSIS — Z8249 Family history of ischemic heart disease and other diseases of the circulatory system: Secondary | ICD-10-CM

## 2021-05-19 DIAGNOSIS — Z79890 Hormone replacement therapy (postmenopausal): Secondary | ICD-10-CM

## 2021-05-19 DIAGNOSIS — Z803 Family history of malignant neoplasm of breast: Secondary | ICD-10-CM

## 2021-05-19 DIAGNOSIS — K3184 Gastroparesis: Secondary | ICD-10-CM | POA: Diagnosis present

## 2021-05-19 DIAGNOSIS — E875 Hyperkalemia: Secondary | ICD-10-CM | POA: Diagnosis present

## 2021-05-19 DIAGNOSIS — E10649 Type 1 diabetes mellitus with hypoglycemia without coma: Secondary | ICD-10-CM | POA: Diagnosis not present

## 2021-05-19 DIAGNOSIS — E039 Hypothyroidism, unspecified: Secondary | ICD-10-CM | POA: Diagnosis present

## 2021-05-19 DIAGNOSIS — F32A Depression, unspecified: Secondary | ICD-10-CM | POA: Diagnosis present

## 2021-05-19 DIAGNOSIS — Z9641 Presence of insulin pump (external) (internal): Secondary | ICD-10-CM | POA: Diagnosis present

## 2021-05-19 DIAGNOSIS — I129 Hypertensive chronic kidney disease with stage 1 through stage 4 chronic kidney disease, or unspecified chronic kidney disease: Secondary | ICD-10-CM | POA: Diagnosis present

## 2021-05-19 DIAGNOSIS — Z20822 Contact with and (suspected) exposure to covid-19: Secondary | ICD-10-CM | POA: Diagnosis present

## 2021-05-19 DIAGNOSIS — R112 Nausea with vomiting, unspecified: Secondary | ICD-10-CM | POA: Diagnosis not present

## 2021-05-19 DIAGNOSIS — Z86718 Personal history of other venous thrombosis and embolism: Secondary | ICD-10-CM

## 2021-05-19 DIAGNOSIS — E2740 Unspecified adrenocortical insufficiency: Secondary | ICD-10-CM | POA: Diagnosis present

## 2021-05-19 LAB — COMPREHENSIVE METABOLIC PANEL
ALT: 38 U/L (ref 0–44)
AST: 46 U/L — ABNORMAL HIGH (ref 15–41)
Albumin: 4.1 g/dL (ref 3.5–5.0)
Alkaline Phosphatase: 79 U/L (ref 38–126)
Anion gap: 17 — ABNORMAL HIGH (ref 5–15)
BUN: 26 mg/dL — ABNORMAL HIGH (ref 6–20)
CO2: 19 mmol/L — ABNORMAL LOW (ref 22–32)
Calcium: 8.5 mg/dL — ABNORMAL LOW (ref 8.9–10.3)
Chloride: 105 mmol/L (ref 98–111)
Creatinine, Ser: 1.34 mg/dL — ABNORMAL HIGH (ref 0.44–1.00)
GFR, Estimated: 55 mL/min — ABNORMAL LOW (ref >60)
Glucose, Bld: 116 mg/dL — ABNORMAL HIGH (ref 70–99)
Potassium: 4.6 mmol/L (ref 3.5–5.1)
Sodium: 141 mmol/L (ref 135–145)
Total Bilirubin: 1.4 mg/dL — ABNORMAL HIGH (ref 0.3–1.2)
Total Protein: 7.2 g/dL (ref 6.5–8.1)

## 2021-05-19 LAB — CBC
HCT: 28.1 % — ABNORMAL LOW (ref 36.0–46.0)
Hemoglobin: 9.1 g/dL — ABNORMAL LOW (ref 12.0–15.0)
MCH: 31 pg (ref 26.0–34.0)
MCHC: 32.4 g/dL (ref 30.0–36.0)
MCV: 95.6 fL (ref 80.0–100.0)
Platelets: 207 10*3/uL (ref 150–400)
RBC: 2.94 MIL/uL — ABNORMAL LOW (ref 3.87–5.11)
RDW: 15.6 % — ABNORMAL HIGH (ref 11.5–15.5)
WBC: 7.9 10*3/uL (ref 4.0–10.5)
nRBC: 0 % (ref 0.0–0.2)

## 2021-05-19 LAB — LIPASE, BLOOD: Lipase: 23 U/L (ref 11–51)

## 2021-05-19 LAB — CBG MONITORING, ED: Glucose-Capillary: 114 mg/dL — ABNORMAL HIGH (ref 70–99)

## 2021-05-19 MED ORDER — ONDANSETRON 4 MG PO TBDP
4.0000 mg | ORAL_TABLET | Freq: Once | ORAL | Status: AC
  Administered 2021-05-19: 22:00:00 4 mg via ORAL
  Filled 2021-05-19: qty 1

## 2021-05-19 NOTE — ED Provider Notes (Signed)
Emergency Medicine Provider Triage Evaluation Note  Ashley Camacho, a 30 y.o. female  was evaluated in triage.  Pt complains of nausea, vomiting, and diarrhea for the last 4 days.  Patient is type I diabetic with insulin pump.  She denies any associated fever, chills, or sweats.  She denies any cough, congestion, or chest pain.  Review of Systems  Positive: NVD Negative: FCS  Physical Exam  BP (!) 133/97    Pulse 98    Temp 98.3 F (36.8 C) (Oral)    Resp 15    Ht 5\' 1"  (1.549 m)    Wt 52.2 kg    LMP 05/19/2021    SpO2 96%    BMI 21.73 kg/m  Gen:   Awake, no distress  NAD Resp:  Normal effort CTA MSK:   Moves extremities without difficulty  Other:  CVS: RRR  Medical Decision Making  Medically screening exam initiated at 9:41 PM.  Appropriate orders placed.  KURT AZIMI was informed that the remainder of the evaluation will be completed by another provider, this initial triage assessment does not replace that evaluation, and the importance of remaining in the ED until their evaluation is complete.  Presents to the ED for evaluation of nausea, vomiting, and diarrhea for the last 4 days.  Patient is type I diabetic with an insulin pump.   [name], PA-C 05/19/21 2142    2143, MD 05/19/21 [phone]

## 2021-05-19 NOTE — ED Triage Notes (Signed)
Pt to ED from home c/o n/v/d x4 days and loss of appetite.  Denies urinary changes or pain.  States type 1 diabetes and has insulin pump.  Pt A&Ox4, chest rise even and unlabored, skin pale, soft spoken answering questions but seems answering appropriately.

## 2021-05-20 ENCOUNTER — Inpatient Hospital Stay
Admission: EM | Admit: 2021-05-20 | Discharge: 2021-05-25 | DRG: 638 | Disposition: A | Discharge status: Home Health | Payer: Medicaid Other | Attending: Internal Medicine | Admitting: Internal Medicine

## 2021-05-20 ENCOUNTER — Encounter: Payer: Self-pay | Admitting: Internal Medicine

## 2021-05-20 ENCOUNTER — Encounter: Payer: Medicaid Other | Admitting: Obstetrics and Gynecology

## 2021-05-20 DIAGNOSIS — N1831 Chronic kidney disease, stage 3a: Secondary | ICD-10-CM | POA: Diagnosis not present

## 2021-05-20 DIAGNOSIS — Z833 Family history of diabetes mellitus: Secondary | ICD-10-CM | POA: Diagnosis not present

## 2021-05-20 DIAGNOSIS — E1010 Type 1 diabetes mellitus with ketoacidosis without coma: Principal | ICD-10-CM | POA: Diagnosis present

## 2021-05-20 DIAGNOSIS — E039 Hypothyroidism, unspecified: Secondary | ICD-10-CM | POA: Diagnosis not present

## 2021-05-20 DIAGNOSIS — F32A Depression, unspecified: Secondary | ICD-10-CM | POA: Diagnosis not present

## 2021-05-20 DIAGNOSIS — E875 Hyperkalemia: Secondary | ICD-10-CM | POA: Diagnosis not present

## 2021-05-20 DIAGNOSIS — E162 Hypoglycemia, unspecified: Secondary | ICD-10-CM | POA: Diagnosis not present

## 2021-05-20 DIAGNOSIS — E2740 Unspecified adrenocortical insufficiency: Secondary | ICD-10-CM | POA: Diagnosis not present

## 2021-05-20 DIAGNOSIS — E10649 Type 1 diabetes mellitus with hypoglycemia without coma: Secondary | ICD-10-CM | POA: Diagnosis not present

## 2021-05-20 DIAGNOSIS — E1022 Type 1 diabetes mellitus with diabetic chronic kidney disease: Secondary | ICD-10-CM | POA: Diagnosis not present

## 2021-05-20 DIAGNOSIS — R112 Nausea with vomiting, unspecified: Secondary | ICD-10-CM

## 2021-05-20 DIAGNOSIS — Z9641 Presence of insulin pump (external) (internal): Secondary | ICD-10-CM | POA: Diagnosis not present

## 2021-05-20 DIAGNOSIS — N179 Acute kidney failure, unspecified: Secondary | ICD-10-CM | POA: Diagnosis not present

## 2021-05-20 DIAGNOSIS — Z803 Family history of malignant neoplasm of breast: Secondary | ICD-10-CM | POA: Diagnosis not present

## 2021-05-20 DIAGNOSIS — E1042 Type 1 diabetes mellitus with diabetic polyneuropathy: Secondary | ICD-10-CM | POA: Diagnosis not present

## 2021-05-20 DIAGNOSIS — I129 Hypertensive chronic kidney disease with stage 1 through stage 4 chronic kidney disease, or unspecified chronic kidney disease: Secondary | ICD-10-CM | POA: Diagnosis not present

## 2021-05-20 DIAGNOSIS — Z794 Long term (current) use of insulin: Secondary | ICD-10-CM | POA: Diagnosis not present

## 2021-05-20 DIAGNOSIS — Z8249 Family history of ischemic heart disease and other diseases of the circulatory system: Secondary | ICD-10-CM | POA: Diagnosis not present

## 2021-05-20 DIAGNOSIS — Z79890 Hormone replacement therapy (postmenopausal): Secondary | ICD-10-CM | POA: Diagnosis not present

## 2021-05-20 DIAGNOSIS — K3184 Gastroparesis: Secondary | ICD-10-CM | POA: Diagnosis not present

## 2021-05-20 DIAGNOSIS — Z86718 Personal history of other venous thrombosis and embolism: Secondary | ICD-10-CM | POA: Diagnosis not present

## 2021-05-20 DIAGNOSIS — Z20822 Contact with and (suspected) exposure to covid-19: Secondary | ICD-10-CM | POA: Diagnosis not present

## 2021-05-20 LAB — BASIC METABOLIC PANEL
Anion gap: 24 — ABNORMAL HIGH (ref 5–15)
Anion gap: 22 — ABNORMAL HIGH (ref 5–15)
Anion gap: 17 — ABNORMAL HIGH (ref 5–15)
Anion gap: 9 (ref 5–15)
BUN: 34 mg/dL — ABNORMAL HIGH (ref 6–20)
BUN: 32 mg/dL — ABNORMAL HIGH (ref 6–20)
BUN: 28 mg/dL — ABNORMAL HIGH (ref 6–20)
BUN: 28 mg/dL — ABNORMAL HIGH (ref 6–20)
CO2: 13 mmol/L — ABNORMAL LOW (ref 22–32)
CO2: 12 mmol/L — ABNORMAL LOW (ref 22–32)
CO2: 16 mmol/L — ABNORMAL LOW (ref 22–32)
CO2: 24 mmol/L (ref 22–32)
Calcium: 8.6 mg/dL — ABNORMAL LOW (ref 8.9–10.3)
Calcium: 8.3 mg/dL — ABNORMAL LOW (ref 8.9–10.3)
Calcium: 8.1 mg/dL — ABNORMAL LOW (ref 8.9–10.3)
Calcium: 8.3 mg/dL — ABNORMAL LOW (ref 8.9–10.3)
Chloride: 103 mmol/L (ref 98–111)
Chloride: 104 mmol/L (ref 98–111)
Chloride: 106 mmol/L (ref 98–111)
Chloride: 108 mmol/L (ref 98–111)
Creatinine, Ser: 1.55 mg/dL — ABNORMAL HIGH (ref 0.44–1.00)
Creatinine, Ser: 1.56 mg/dL — ABNORMAL HIGH (ref 0.44–1.00)
Creatinine, Ser: 1.47 mg/dL — ABNORMAL HIGH (ref 0.44–1.00)
Creatinine, Ser: 1.35 mg/dL — ABNORMAL HIGH (ref 0.44–1.00)
GFR, Estimated: 46 mL/min — ABNORMAL LOW (ref >60)
GFR, Estimated: 46 mL/min — ABNORMAL LOW (ref >60)
GFR, Estimated: 49 mL/min — ABNORMAL LOW (ref >60)
GFR, Estimated: 55 mL/min — ABNORMAL LOW (ref >60)
Glucose, Bld: 181 mg/dL — ABNORMAL HIGH (ref 70–99)
Glucose, Bld: 204 mg/dL — ABNORMAL HIGH (ref 70–99)
Glucose, Bld: 197 mg/dL — ABNORMAL HIGH (ref 70–99)
Glucose, Bld: 123 mg/dL — ABNORMAL HIGH (ref 70–99)
Potassium: 4.6 mmol/L (ref 3.5–5.1)
Potassium: 4.8 mmol/L (ref 3.5–5.1)
Potassium: 4.3 mmol/L (ref 3.5–5.1)
Potassium: 4.3 mmol/L (ref 3.5–5.1)
Sodium: 140 mmol/L (ref 135–145)
Sodium: 138 mmol/L (ref 135–145)
Sodium: 139 mmol/L (ref 135–145)
Sodium: 141 mmol/L (ref 135–145)

## 2021-05-20 LAB — URINALYSIS, ROUTINE W REFLEX MICROSCOPIC
Glucose, UA: NEGATIVE mg/dL
Ketones, ur: >160 mg/dL — AB
Leukocytes,Ua: NEGATIVE
Nitrite: NEGATIVE
Protein, ur: >300 mg/dL — AB
Specific Gravity, Urine: >1.03 — ABNORMAL HIGH (ref 1.005–1.030)
pH: 6 (ref 5.0–8.0)

## 2021-05-20 LAB — RESP PANEL BY RT-PCR (FLU A&B, COVID) ARPGX2
Influenza A by PCR: NEGATIVE
Influenza B by PCR: NEGATIVE
SARS Coronavirus 2 by RT PCR: NEGATIVE

## 2021-05-20 LAB — CBG MONITORING, ED
Glucose-Capillary: 173 mg/dL — ABNORMAL HIGH (ref 70–99)
Glucose-Capillary: 213 mg/dL — ABNORMAL HIGH (ref 70–99)
Glucose-Capillary: 203 mg/dL — ABNORMAL HIGH (ref 70–99)
Glucose-Capillary: 178 mg/dL — ABNORMAL HIGH (ref 70–99)
Glucose-Capillary: 169 mg/dL — ABNORMAL HIGH (ref 70–99)
Glucose-Capillary: 158 mg/dL — ABNORMAL HIGH (ref 70–99)
Glucose-Capillary: 121 mg/dL — ABNORMAL HIGH (ref 70–99)
Glucose-Capillary: 63 mg/dL — ABNORMAL LOW (ref 70–99)
Glucose-Capillary: 124 mg/dL — ABNORMAL HIGH (ref 70–99)
Glucose-Capillary: 142 mg/dL — ABNORMAL HIGH (ref 70–99)

## 2021-05-20 LAB — POC URINE PREG, ED: Preg Test, Ur: NEGATIVE

## 2021-05-20 LAB — BLOOD GAS, VENOUS
Acid-base deficit: 15.6 mmol/L — ABNORMAL HIGH (ref 0.0–2.0)
Bicarbonate: 10.8 mmol/L — ABNORMAL LOW (ref 20.0–28.0)
O2 Saturation: 75.4 %
Patient temperature: 37
pCO2, Ven: 27 mmHg — ABNORMAL LOW (ref 44.0–60.0)
pH, Ven: 7.21 — ABNORMAL LOW (ref 7.250–7.430)
pO2, Ven: 50 mmHg — ABNORMAL HIGH (ref 32.0–45.0)

## 2021-05-20 LAB — URINALYSIS, MICROSCOPIC (REFLEX)

## 2021-05-20 LAB — MAGNESIUM: Magnesium: 2.2 mg/dL (ref 1.7–2.4)

## 2021-05-20 LAB — BETA-HYDROXYBUTYRIC ACID: Beta-Hydroxybutyric Acid: >8 mmol/L — ABNORMAL HIGH (ref 0.05–0.27)

## 2021-05-20 LAB — HCG, QUANTITATIVE, PREGNANCY: hCG, Beta Chain, Quant, S: 1 m[IU]/mL (ref <5)

## 2021-05-20 MED ORDER — POTASSIUM CHLORIDE 10 MEQ/100ML IV SOLN
10.0000 meq | INTRAVENOUS | Status: AC
  Administered 2021-05-20 (×2): 10 meq via INTRAVENOUS
  Filled 2021-05-20: qty 100

## 2021-05-20 MED ORDER — LACTATED RINGERS IV BOLUS
1000.0000 mL | Freq: Once | INTRAVENOUS | Status: AC
  Administered 2021-05-20: 08:00:00 1000 mL via INTRAVENOUS

## 2021-05-20 MED ORDER — ONDANSETRON HCL 4 MG/2ML IJ SOLN
4.0000 mg | Freq: Four times a day (QID) | INTRAMUSCULAR | Status: DC | PRN
  Administered 2021-05-20 (×2): 4 mg via INTRAVENOUS
  Filled 2021-05-20 (×2): qty 2

## 2021-05-20 MED ORDER — LACTATED RINGERS IV SOLN: INTRAVENOUS | Status: DC

## 2021-05-20 MED ORDER — ACETAMINOPHEN 325 MG PO TABS
650.0000 mg | ORAL_TABLET | Freq: Four times a day (QID) | ORAL | Status: DC | PRN
  Filled 2021-05-20: qty 2

## 2021-05-20 MED ORDER — DEXTROSE 50 % IV SOLN
0.0000 mL | INTRAVENOUS | Status: DC | PRN
  Administered 2021-05-20: 21:00:00 25 mL via INTRAVENOUS
  Administered 2021-05-21: 06:00:00 50 mL via INTRAVENOUS
  Filled 2021-05-20 (×3): qty 50

## 2021-05-20 MED ORDER — LACTATED RINGERS IV BOLUS
1000.0000 mL | Freq: Once | INTRAVENOUS | Status: AC
  Administered 2021-05-20: 09:00:00 1000 mL via INTRAVENOUS

## 2021-05-20 MED ORDER — INSULIN REGULAR(HUMAN) IN NACL 100-0.9 UT/100ML-% IV SOLN
INTRAVENOUS | Status: DC
  Administered 2021-05-20: 10:00:00 1.7 [IU]/h via INTRAVENOUS
  Filled 2021-05-20: qty 100

## 2021-05-20 MED ORDER — METOCLOPRAMIDE HCL 5 MG/ML IJ SOLN
10.0000 mg | Freq: Once | INTRAMUSCULAR | Status: AC
  Administered 2021-05-20: 08:00:00 10 mg via INTRAVENOUS
  Filled 2021-05-20: qty 2

## 2021-05-20 MED ORDER — DEXTROSE IN LACTATED RINGERS 5 % IV SOLN: INTRAVENOUS | Status: DC

## 2021-05-20 NOTE — ED Notes (Signed)
RT called at this time to inform of VBG being sent to lab

## 2021-05-20 NOTE — H&P (Signed)
History and Physical:    Ashley Camacho   [id number] DOB: Apr 13, 1991 DOA: 05/20/2021  Referring MD/provider: Blake Divine, MD PCP: Pcp, No   Patient coming from: Home  Chief Complaint: Nausea and vomiting  History of Present Illness:   Ashley Camacho is a 30 y.o. female with medical history significant for type I DM on insulin pump, adrenal insufficiency, peripheral neuropathy, depression, hypertension, diabetic gastroparesis, recent hypoglycemic episodes requiring intubation on 04/27/2021, recent hospitalization for community-acquired pneumonia from 05/05/2021 through 05/10/2021.  She presented to the hospital because of nausea, vomiting, diarrhea, poor oral intake and generalized weakness.  She said her symptoms started about 4 days ago.  She has not been able to keep any food down.  She has also been having loose, nonbloody watery stools and she has about 1-2 watery stools a day.  No abdominal pain, fever, chills, shortness of breath, chest pain.  She recently had hypoglycemic episodes and she said she was advised to take Cortef.  She said she has not picked up her Cortef from the pharmacy since she was discharged from the hospital.  She said she had also spoken to her endocrinologist, Dr. Barry Brunner, about hypoglycemic episodes and she recommended that she take Cortef  ED Course:  The patient was found to have DKA and she was started on IV fluids and IV insulin.  ROS:   ROS all other systems reviewed were negative.  Past Medical History:   Past Medical History:  Diagnosis Date   Adrenal insufficiency ([place])    Anemia    Gastroparesis    Hypertension    Type 1 diabetes ([place])     Past Surgical History:   Past Surgical History:  Procedure Laterality Date   BIOPSY  01/14/2021   Procedure: BIOPSY;  Surgeon: Thornton Park, MD;  Location: [facility];  Service: Gastroenterology;;   CESAREAN SECTION     CESAREAN SECTION WITH BILATERAL TUBAL LIGATION N/A 02/17/2021    Procedure: CESAREAN SECTION WITH BILATERAL TUBAL LIGATION;  Surgeon: Truett Mainland, DO;  Location: [facility];  Service: Obstetrics;  Laterality: N/A;   ESOPHAGOGASTRODUODENOSCOPY (EGD) WITH PROPOFOL N/A 01/14/2021   Procedure: ESOPHAGOGASTRODUODENOSCOPY (EGD) WITH PROPOFOL;  Surgeon: Thornton Park, MD;  Location: [facility];  Service: Gastroenterology;  Laterality: N/A;   MOUTH SURGERY      Social History:   Social History   Socioeconomic History   Marital status: Single    Spouse name: Not on file   Number of children: Not on file   Years of education: Not on file   Highest education level: Not on file  Occupational History   Occupation: home maker  Tobacco Use   Smoking status: Never   Smokeless tobacco: Never  Vaping Use   Vaping Use: Never used  Substance and Sexual Activity   Alcohol use: No   Drug use: No   Sexual activity: Yes    Birth control/protection: None  Other Topics Concern   Not on file  Social History Narrative   Not on file   Social Determinants of Health   Financial Resource Strain: Not on file  Food Insecurity: Not on file  Transportation Needs: Not on file  Physical Activity: Not on file  Stress: Not on file  Social Connections: Not on file  Intimate Partner Violence: Not on file    Allergies   Patient has no known allergies.  Family history:   Family History  Problem Relation Age of Onset   Diabetes type I  Father    CAD Father    CAD Paternal Grandmother    CAD Paternal Grandfather    Breast cancer Mother    Ovarian cancer Neg Hx     Current Medications:   Prior to Admission medications   Medication Sig Start Date End Date Taking? Authorizing Provider  gabapentin (NEURONTIN) 100 MG capsule Take 2 capsules (200 mg total) by mouth 3 (three) times daily. 05/10/21  Yes Esaw Grandchild A, DO  insulin aspart (NOVOLOG) 100 UNIT/ML FlexPen Inject 2 Units into the skin 3 (three) times daily with meals Inject 0-6 Units into the skin  every 4 (four) hours. 03/04/21  Yes Constant, Peggy, MD  acetaminophen (TYLENOL) 500 MG tablet Take 2 tablets (1,000 mg total) by mouth every 6 (six) hours as needed for headache, fever or moderate pain. 03/04/21   Constant, Peggy, MD  feeding supplement, GLUCERNA SHAKE, (GLUCERNA SHAKE) LIQD Take 237 mLs by mouth 3 (three) times daily between meals. Patient not taking: Reported on 05/20/2021 05/10/21   Esaw Grandchild A, DO  HYDROcodone-acetaminophen (NORCO/VICODIN) 5-325 MG tablet Take 1-2 tablets by mouth every 6 (six) hours as needed for moderate pain or severe pain. Patient not taking: Reported on 05/20/2021 05/10/21   Esaw Grandchild A, DO  hydrocortisone (CORTEF) 5 MG tablet Take 1 tablet (5 mg total) by mouth 2 (two) times daily. Patient not taking: Reported on 05/20/2021 05/10/21   Esaw Grandchild A, DO  ibuprofen (ADVIL) 600 MG tablet Take 1 tablet (600 mg total) by mouth every 6 (six) hours as needed for fever, headache or mild pain. Patient not taking: Reported on 05/20/2021 05/10/21   Pennie Banter, DO  levothyroxine (SYNTHROID) 25 MCG tablet Take 1 tablet (25 mcg total) by mouth daily at 6 (six) AM. Patient not taking: Reported on 05/20/2021 05/11/21   Pennie Banter, DO  metoCLOPramide (REGLAN) 10 MG tablet Take 1 tablet (10 mg total) by mouth 4 (four) times daily -  before meals and at bedtime. Patient not taking: Reported on 05/20/2021 05/10/21   Esaw Grandchild A, DO  mirtazapine (REMERON) 7.5 MG tablet Take 1 tablet (7.5 mg total) by mouth at bedtime. Patient not taking: Reported on 05/20/2021 05/10/21   Esaw Grandchild A, DO  Multiple Vitamin (MULTIVITAMIN WITH MINERALS) TABS tablet Take 1 tablet by mouth daily. Patient not taking: Reported on 05/20/2021 05/11/21   Pennie Banter, DO    Physical Exam:   Vitals:   05/20/21 0900 05/20/21 0930 05/20/21 1015 05/20/21 1030  BP: 125/85 110/72 121/84 113/87  Pulse: (!) 105 (!) 103 (!) 105 (!) 101  Resp: 16   20   Temp:      TempSrc:      SpO2: 100% 99% 100% 99%  Weight:      Height:         Physical Exam: Blood pressure 113/87, pulse (!) 101, temperature 98.3 F (36.8 C), temperature source Oral, resp. rate 20, height 5\' 1"  (1.549 m), weight 52.2 kg, last menstrual period 05/19/2021, SpO2 99 %, unknown if currently breastfeeding. Gen: No acute distress. Head: Normocephalic, atraumatic. Eyes: Pupils equal, round and reactive to light. Extraocular movements intact.  Sclerae nonicteric.  Mouth: Dry mucous membranes Neck: Supple, no thyromegaly, no lymphadenopathy, no jugular venous distention. Chest: Lungs are clear to auscultation with good air movement. No rales, rhonchi or wheezes.  CV: Heart sounds are regular with an S1, S2. No murmurs, rubs or gallops.  Abdomen: Soft, nontender, nondistended with normal active bowel sounds.  No palpable masses. Extremities: Extremities are without clubbing, or cyanosis. No edema. Pedal pulses 2+.  Skin: Warm and dry. No rashes, lesions or wounds Neuro: Alert and oriented times 3; grossly nonfocal.  Psych: Insight is good and judgment is appropriate. Mood and affect normal.   Data Review:    Labs: Basic Metabolic Panel: Recent Labs  Lab 05/19/21 2136 05/20/21 0749 05/20/21 0951  NA 141 140 138  K 4.6 4.6 4.8  CL 105 103 104  CO2 19* 13* 12*  GLUCOSE 116* 181* 204*  BUN 26* 34* 32*  CREATININE 1.34* 1.55* 1.56*  CALCIUM 8.5* 8.6* 8.3*  MG  --  2.2  --    Liver Function Tests: Recent Labs  Lab 05/19/21 2136  AST 46*  ALT 38  ALKPHOS 79  BILITOT 1.4*  PROT 7.2  ALBUMIN 4.1   Recent Labs  Lab 05/19/21 2136  LIPASE 23   No results for input(s): AMMONIA in the last 168 hours. CBC: Recent Labs  Lab 05/19/21 2136  WBC 7.9  HGB 9.1*  HCT 28.1*  MCV 95.6  PLT 207   Cardiac Enzymes: No results for input(s): CKTOTAL, CKMB, CKMBINDEX, TROPONINI in the last 168 hours.  BNP (last 3 results) No results for input(s): PROBNP in the  last 8760 hours. CBG: Recent Labs  Lab 05/19/21 2136 05/20/21 1019 05/20/21 1126 05/20/21 1232  GLUCAP 114* 173* 213* 203*    Urinalysis    Component Value Date/Time   COLORURINE YELLOW 05/20/2021 1053   APPEARANCEUR CLEAR 05/20/2021 1053   APPEARANCEUR Clear 09/18/2020 1153   LABSPEC >1.030 (H) 05/20/2021 1053   LABSPEC 1.026 03/01/2013 1844   PHURINE 6.0 05/20/2021 1053   GLUCOSEU NEGATIVE 05/20/2021 1053   GLUCOSEU >=500 03/01/2013 1844   HGBUR LARGE (A) 05/20/2021 1053   BILIRUBINUR SMALL (A) 05/20/2021 1053   BILIRUBINUR neg 10/23/2020 0953   BILIRUBINUR Negative 09/18/2020 1153   BILIRUBINUR Negative 03/01/2013 1844   KETONESUR >160 (A) 05/20/2021 1053   PROTEINUR >300 (A) 05/20/2021 1053   UROBILINOGEN 0.2 10/23/2020 0953   NITRITE NEGATIVE 05/20/2021 [place] 05/20/2021 1053   LEUKOCYTESUR Negative 03/01/2013 1844      Radiographic Studies: No results found.  EKG: Not done   Assessment/Plan:   Principal Problem:   DKA, type 1 (HCC)   Body mass index is 21.73 kg/m.  DKA, type I: Admit to stepdown unit.  Treat with IV insulin infusion and IV fluids we will monitor glucose levels per DKA protocol.  Antiemetics as needed for nausea/vomiting.  CKD stage IIIa: Creatinine is stable.  Adrenal insufficiency: Resume hydrocortisone tomorrow  Hypothyroidism: Resume Synthroid tomorrow  Depression: Continue Remeron  Other information:   DVT prophylaxis:   Lovenox  Code Status: Full code. Family Communication: None  Disposition Plan: Possible discharge home in 2 to 3 days Consults called: None Admission status: Inpatient  The medical decision making on this patient was of high complexity and the patient is at high risk for clinical deterioration, therefore this is a level 3 visit.    Eriyah Fernando [facility] Pager: Please check [email or web address]   How to contact the [facility] Attending or Consulting provider [place] or  covering provider during after hours [place], for this patient?   Check the care team in [facility] and look for a) attending/consulting [facility] provider listed and b) the [facility] team listed Log into [email or web address] and use [place]'s universal password to access. If you do not have the password,  please contact the hospital operator. Locate the [facility] provider you are looking for under [facility] and page to a number that you can be directly reached. If you still have difficulty reaching the provider, please page the [facility] (Director on Call) for the Hospitalists listed on amion for assistance.  05/20/2021, 1:08 PM

## 2021-05-20 NOTE — ED Provider Notes (Signed)
[facility] Emergency Department Provider Note   ____________________________________________   Event Date/Time   First MD Initiated Contact with Patient 05/20/21 0700     (approximate)  I have reviewed the triage vital signs and the nursing notes.   HISTORY  Chief Complaint Vomiting    HPI Ashley Camacho is a 30 y.o. female with past medical history of hypertension, type 1 diabetes, gastroparesis, and hypothyroidism who presents to the ED complaining of nausea and vomiting.  Patient reports that she has been dealing with persistent nausea and vomiting for the past 4 days along with a small amount of diarrhea.  She denies any abdominal pain and has not noticed any blood in her emesis or stool.  She denies any fevers, dysuria, or hematuria.  She reports not being able to eat or drink anything at all over the past 48 hours.  She had been using her insulin pump as normal, but states she took it off sometime yesterday since she was not eating.  She states her period started in the past couple of days and she has not missed any periods recently.  She was given Zofran in triage and reports no improvement in her symptoms.        Past Medical History:  Diagnosis Date   Adrenal insufficiency (HCC)    Anemia    Gastroparesis    Hypertension    Type 1 diabetes [facility])     Patient Active Problem List   Diagnosis Date Noted   DKA, type 1 (HCC) 05/20/2021   Moderate recurrent major depression (HCC) 05/08/2021   Right arm pain 05/07/2021   Rash of hand 05/07/2021   Hyperkalemia 05/06/2021   History of adrenal insufficiency 05/06/2021   CAP (community acquired pneumonia) 05/05/2021   Hypoglycemia 04/27/2021   Postoperative hematoma involving genitourinary system following genitourinary procedure    S/P cesarean section 02/17/2021   Acute esophagitis    Leg edema    History of preterm delivery, currently pregnant    Pre-existing type 1 diabetes mellitus during  pregnancy in third trimester    Anemia during pregnancy    Type 1 diabetes mellitus affecting pregnancy in second trimester, antepartum    HFrEF (heart failure with reduced ejection fraction) (HCC)    Acute deep vein thrombosis (DVT) of brachial vein of right upper extremity (HCC) 12/17/2020   Anxiety 12/17/2020   Diabetic retinopathy (HCC) 12/17/2020   Acute on chronic combined systolic and diastolic CHF (congestive heart failure) (HCC)    Hyperemesis 12/13/2020   Type 1 diabetes mellitus with hypoglycemia without coma (HCC) 12/13/2020   Sunburn 12/13/2020   Hypothyroidism 12/10/2020   Malnutrition (HCC) 12/10/2020   Vitreous hemorrhage of right eye (HCC) 12/10/2020   Pleural effusion associated with pulmonary infection 11/25/2020   Pneumonia affecting pregnancy 11/24/2020   Diabetes mellitus affecting pregnancy, second trimester 11/24/2020   Edema 11/24/2020   Decreased urine output 11/24/2020   Shortness of breath    Zinc deficiency    SOB (shortness of breath)    Social problem 11/21/2020   Nausea and vomiting during pregnancy prior to [redacted] weeks gestation 11/21/2020   Low serum vitamin B12    Delivery with history of C-section 11/20/2020   Absolute anemia    Acute kidney injury (HCC)    Nausea and vomiting during pregnancy 10/23/2020   History of premature delivery, currently pregnant, second trimester 10/13/2020   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   Depressive disorder 09/28/2018   Peripheral neuropathy 09/28/2018  Diabetic gastroparesis ([place]) 04/22/2016   Diabetic sensorimotor neuropathy ([place]) 04/19/2016   DKA (diabetic ketoacidoses) 04/03/2016   Microalbuminuria 09/26/2005    Past Surgical History:  Procedure Laterality Date   BIOPSY  01/14/2021   Procedure: BIOPSY;  Surgeon: Thornton Park, MD;  Location: [facility] ENDOSCOPY;  Service: Gastroenterology;;   CESAREAN SECTION     CESAREAN SECTION WITH BILATERAL TUBAL LIGATION N/A 02/17/2021   Procedure: CESAREAN SECTION  WITH BILATERAL TUBAL LIGATION;  Surgeon: Truett Mainland, DO;  Location: [place] ORS;  Service: Obstetrics;  Laterality: N/A;   ESOPHAGOGASTRODUODENOSCOPY (EGD) WITH PROPOFOL N/A 01/14/2021   Procedure: ESOPHAGOGASTRODUODENOSCOPY (EGD) WITH PROPOFOL;  Surgeon: Thornton Park, MD;  Location: [place];  Service: Gastroenterology;  Laterality: N/A;   MOUTH SURGERY      Prior to Admission medications   Medication Sig Start Date End Date Taking? Authorizing Provider  acetaminophen (TYLENOL) 500 MG tablet Take 2 tablets (1,000 mg total) by mouth every 6 (six) hours as needed for headache, fever or moderate pain. 03/04/21   Constant, Peggy, MD  feeding supplement, GLUCERNA SHAKE, (GLUCERNA SHAKE) LIQD Take 237 mLs by mouth 3 (three) times daily between meals. 05/10/21   Ezekiel Slocumb, DO  gabapentin (NEURONTIN) 100 MG capsule Take 2 capsules (200 mg total) by mouth 3 (three) times daily. 05/10/21   Ezekiel Slocumb, DO  HYDROcodone-acetaminophen (NORCO/VICODIN) 5-325 MG tablet Take 1-2 tablets by mouth every 6 (six) hours as needed for moderate pain or severe pain. 05/10/21   Ezekiel Slocumb, DO  hydrocortisone (CORTEF) 5 MG tablet Take 1 tablet (5 mg total) by mouth 2 (two) times daily. 05/10/21   Nicole Kindred A, DO  ibuprofen (ADVIL) 600 MG tablet Take 1 tablet (600 mg total) by mouth every 6 (six) hours as needed for fever, headache or mild pain. 05/10/21   Nicole Kindred A, DO  insulin aspart (NOVOLOG) 100 UNIT/ML FlexPen Inject 2 Units into the skin 3 (three) times daily with meals Inject 0-6 Units into the skin every 4 (four) hours. 03/04/21   Constant, Peggy, MD  levothyroxine (SYNTHROID) 25 MCG tablet Take 1 tablet (25 mcg total) by mouth daily at 6 (six) AM. 05/11/21   Ezekiel Slocumb, DO  metoCLOPramide (REGLAN) 10 MG tablet Take 1 tablet (10 mg total) by mouth 4 (four) times daily -  before meals and at bedtime. 05/10/21   Nicole Kindred A, DO  mirtazapine (REMERON) 7.5 MG  tablet Take 1 tablet (7.5 mg total) by mouth at bedtime. 05/10/21   Ezekiel Slocumb, DO  Multiple Vitamin (MULTIVITAMIN WITH MINERALS) TABS tablet Take 1 tablet by mouth daily. 05/11/21   Ezekiel Slocumb, DO    Allergies Patient has no known allergies.  Family History  Problem Relation Age of Onset   Diabetes type I Father    CAD Father    CAD Paternal Grandmother    CAD Paternal Grandfather    Breast cancer Mother    Ovarian cancer Neg Hx     Social History Social History   Tobacco Use   Smoking status: Never   Smokeless tobacco: Never  Vaping Use   Vaping Use: Never used  Substance Use Topics   Alcohol use: No   Drug use: No    Review of Systems  Constitutional: No fever/chills Eyes: No visual changes. ENT: No sore throat. Cardiovascular: Denies chest pain. Respiratory: Denies shortness of breath. Gastrointestinal: No abdominal pain.  Nausea, vomiting, and diarrhea.  No constipation. Genitourinary: Negative for dysuria.  Musculoskeletal: Negative for back pain. Skin: Negative for rash. Neurological: Negative for headaches, focal weakness or numbness.  ____________________________________________   PHYSICAL EXAM:  VITAL SIGNS: ED Triage Vitals  Enc Vitals Group     BP 05/19/21 2131 (!) 133/97     Pulse Rate 05/19/21 2131 98     Resp 05/19/21 2131 15     Temp 05/19/21 2131 98.3 F (36.8 C)     Temp Source 05/19/21 2131 Oral     SpO2 05/19/21 2131 96 %     Weight 05/19/21 2130 115 lb (52.2 kg)     Height 05/19/21 2130 5\' 1"  (1.549 m)     Head Circumference --      Peak Flow --      Pain Score 05/19/21 2130 0     Pain Loc --      Pain Edu? --      Excl. in GC? --     Constitutional: Alert and oriented. Eyes: Conjunctivae are normal. Head: Atraumatic. Nose: No congestion/rhinnorhea. Mouth/Throat: Mucous membranes are dry. Neck: Normal ROM Cardiovascular: Normal rate, regular rhythm. Grossly normal heart sounds.  2+ radial pulses  bilaterally. Respiratory: Normal respiratory effort.  No retractions. Lungs CTAB. Gastrointestinal: Soft and nontender. No distention. Genitourinary: deferred Musculoskeletal: No lower extremity tenderness nor edema. Neurologic:  Normal speech and language. No gross focal neurologic deficits are appreciated. Skin:  Skin is warm, dry and intact. No rash noted. Psychiatric: Mood and affect are normal. Speech and behavior are normal.  ____________________________________________   LABS (all labs ordered are listed, but only abnormal results are displayed)  Labs Reviewed  COMPREHENSIVE METABOLIC PANEL - Abnormal; Notable for the following components:      Result Value   CO2 19 (*)    Glucose, Bld 116 (*)    BUN 26 (*)    Creatinine, Ser 1.34 (*)    Calcium 8.5 (*)    AST 46 (*)    Total Bilirubin 1.4 (*)    GFR, Estimated 55 (*)    Anion gap 17 (*)    All other components within normal limits  CBC - Abnormal; Notable for the following components:   RBC 2.94 (*)    Hemoglobin 9.1 (*)    HCT 28.1 (*)    RDW 15.6 (*)    All other components within normal limits  BASIC METABOLIC PANEL - Abnormal; Notable for the following components:   CO2 13 (*)    Glucose, Bld 181 (*)    BUN 34 (*)    Creatinine, Ser 1.55 (*)    Calcium 8.6 (*)    GFR, Estimated 46 (*)    Anion gap 24 (*)    All other components within normal limits  BLOOD GAS, VENOUS - Abnormal; Notable for the following components:   pH, Ven 7.21 (*)    pCO2, Ven 27 (*)    pO2, Ven 50.0 (*)    Bicarbonate 10.8 (*)    Acid-base deficit 15.6 (*)    All other components within normal limits  CBG MONITORING, ED - Abnormal; Notable for the following components:   Glucose-Capillary 114 (*)    All other components within normal limits  RESP PANEL BY RT-PCR (FLU A&B, COVID) ARPGX2  LIPASE, BLOOD  MAGNESIUM  HCG, QUANTITATIVE, PREGNANCY  URINALYSIS, ROUTINE W REFLEX MICROSCOPIC  BETA-HYDROXYBUTYRIC ACID  BASIC METABOLIC  PANEL  BASIC METABOLIC PANEL  BASIC METABOLIC PANEL  BASIC METABOLIC PANEL  CBG MONITORING, ED  POC URINE PREG, ED  POC URINE PREG, ED    PROCEDURES  Procedure(s) performed (including Critical Care):  .Critical Care Performed by: Blake Divine, MD Authorized by: Blake Divine, MD   Critical care provider statement:    Critical care time (minutes):  45   Critical care time was exclusive of:  Separately billable procedures and treating other patients and teaching time   Critical care was necessary to treat or prevent imminent or life-threatening deterioration of the following conditions:  Endocrine crisis and metabolic crisis   Critical care was time spent personally by me on the following activities:  Development of treatment plan with patient or surrogate, discussions with consultants, evaluation of patient's response to treatment, examination of patient, ordering and review of laboratory studies, ordering and review of radiographic studies, ordering and performing treatments and interventions, pulse oximetry, re-evaluation of patient's condition and review of old charts   I assumed direction of critical care for this patient from another provider in my specialty: no     Care discussed with: admitting provider     ____________________________________________   INITIAL IMPRESSION / ASSESSMENT AND PLAN / ED COURSE      30 year old female with past medical history of hypertension, type 1 diabetes, gastroparesis, and hypothyroidism who presents to the ED with persistent nausea and vomiting over the past 4 days and inability to tolerate p.o. over the past 2 days.  Initial labs showed normal glucose level but increased anion gap and mild acidosis, potentially concerning for starvation ketosis.  Electrolytes are unremarkable and renal function stable from previous.  Patient with no abdominal tenderness to necessitate CT imaging, suspect symptoms are related to known gastroparesis and we  will hydrate with IV fluids, treat symptomatically with IV Reglan.  Unfortunately, patient had been waiting for greater than 10 hours due to ED boarding and we will need to repeat labs to ensure glucose remains stable with no worsening acidosis.  Pregnancy testing and UA are pending.  Beta hCG is negative, patient unable to provide urine sample thus far.  Repeat labs show worsening acidosis with increased anion gap, only slight increase in glucose however given her brittle diabetes, I am still concerned for DKA.  Lab has apparently had to dilute her beta hydroxybutyrate multiple times and it is still pending.  We will start patient on insulin drip with supplemental potassium, also give additional IV fluid bolus.  Case discussed with hospitalist for admission.      ____________________________________________   FINAL CLINICAL IMPRESSION(S) / ED DIAGNOSES  Final diagnoses:  Diabetic ketoacidosis without coma associated with type 1 diabetes mellitus ([place])  Intractable nausea and vomiting     ED Discharge Orders     None        Note:  This document was prepared using Dragon voice recognition software and may include unintentional dictation errors.    Blake Divine, MD 05/20/21 1017

## 2021-05-20 NOTE — Progress Notes (Signed)
Inpatient Diabetes Program Recommendations  AACE/ADA: New Consensus Statement on Inpatient Glycemic Control  Target Ranges:  Prepandial:   less than 140 mg/dL      Peak postprandial:   less than 180 mg/dL (1-2 hours)      Critically ill patients:  140 - 180 mg/dL    Latest Reference Range & Units 05/20/21 10:19  Glucose-Capillary 70 - 99 mg/dL 673 (H)  (H): Data is abnormally high   Latest Reference Range & Units 05/20/21 07:49  CO2 22 - 32 mmol/L 13 (L)  Glucose 70 - 99 mg/dL 419 (H)  Anion gap 5 - 15  24 (H)    Latest Reference Range & Units 05/20/21 07:49  Beta-Hydroxybutyric Acid 0.05 - 0.27 mmol/L >8.00 (H)   Review of Glycemic Control  Diabetes history: DM1 (makes NO insulin; requires basal, correction, and carbohydrate coverage insulin) Outpatient Diabetes medications: T-slim insulin pump; per prior visit insulin pump settings should be: Basal 12A 0.2 units/hour 6A   0.3 units/hour Total Basal: 6.6 units per 24 hours Correction Factor 1:80 mg/dl (1 unit drops glucose 80 mg/dl) Carb Ratio 3:79 grams (1 unit covers 20 grams of carbs) Target glucose 110 mg/dl Current orders for Inpatient glycemic control: IV insulin  Inpatient Diabetes Program Recommendations:    Insulin: IV insulin should be continued until acidosis is completely resolved. Once acidosis is completely resolved, please consider ordering Semglee 3 units Q24H and Novolog 0-6 units Q4H.  NOTE: Patient is well known to inpatient diabetes team and has had several admissions this year. Patient was most recently inpatient from 05/05/2021-05/10/2021. Patient has Type 1 DM and is very sensitive to insulin. Per note by Dr. Larinda Buttery on 05/20/21, " presents to the ED complaining of nausea and vomiting.  Patient reports that she has been dealing with persistent nausea and vomiting for the past 4 days along with a small amount of diarrhea. She reports not being able to eat or drink anything at all over the past 48 hours.   She had been using her insulin pump as normal, but states she took it off sometime yesterday since she was not eating."  Thanks, Orlando Penner, RN, MSN, CDE Diabetes Coordinator Inpatient Diabetes Program [phone] (Team Pager from 8am to 5pm)

## 2021-05-21 LAB — BASIC METABOLIC PANEL
Anion gap: 4 — ABNORMAL LOW (ref 5–15)
Anion gap: 6 (ref 5–15)
BUN: 27 mg/dL — ABNORMAL HIGH (ref 6–20)
BUN: 25 mg/dL — ABNORMAL HIGH (ref 6–20)
CO2: 25 mmol/L (ref 22–32)
CO2: 22 mmol/L (ref 22–32)
Calcium: 8.1 mg/dL — ABNORMAL LOW (ref 8.9–10.3)
Calcium: 7.7 mg/dL — ABNORMAL LOW (ref 8.9–10.3)
Chloride: 108 mmol/L (ref 98–111)
Chloride: 108 mmol/L (ref 98–111)
Creatinine, Ser: 1.52 mg/dL — ABNORMAL HIGH (ref 0.44–1.00)
Creatinine, Ser: 1.45 mg/dL — ABNORMAL HIGH (ref 0.44–1.00)
GFR, Estimated: 47 mL/min — ABNORMAL LOW (ref >60)
GFR, Estimated: 50 mL/min — ABNORMAL LOW (ref >60)
Glucose, Bld: 140 mg/dL — ABNORMAL HIGH (ref 70–99)
Glucose, Bld: 194 mg/dL — ABNORMAL HIGH (ref 70–99)
Potassium: 3.9 mmol/L (ref 3.5–5.1)
Potassium: 4.3 mmol/L (ref 3.5–5.1)
Sodium: 137 mmol/L (ref 135–145)
Sodium: 136 mmol/L (ref 135–145)

## 2021-05-21 LAB — CBG MONITORING, ED
Glucose-Capillary: 135 mg/dL — ABNORMAL HIGH (ref 70–99)
Glucose-Capillary: 145 mg/dL — ABNORMAL HIGH (ref 70–99)
Glucose-Capillary: 179 mg/dL — ABNORMAL HIGH (ref 70–99)
Glucose-Capillary: 181 mg/dL — ABNORMAL HIGH (ref 70–99)
Glucose-Capillary: 188 mg/dL — ABNORMAL HIGH (ref 70–99)
Glucose-Capillary: 191 mg/dL — ABNORMAL HIGH (ref 70–99)
Glucose-Capillary: 192 mg/dL — ABNORMAL HIGH (ref 70–99)
Glucose-Capillary: 213 mg/dL — ABNORMAL HIGH (ref 70–99)
Glucose-Capillary: 27 mg/dL — CL (ref 70–99)
Glucose-Capillary: 30 mg/dL — CL (ref 70–99)
Glucose-Capillary: 33 mg/dL — CL (ref 70–99)
Glucose-Capillary: 64 mg/dL — ABNORMAL LOW (ref 70–99)

## 2021-05-21 LAB — BETA-HYDROXYBUTYRIC ACID
Beta-Hydroxybutyric Acid: 0.15 mmol/L (ref 0.05–0.27)
Beta-Hydroxybutyric Acid: 0.09 mmol/L (ref 0.05–0.27)
Beta-Hydroxybutyric Acid: 0.29 mmol/L — ABNORMAL HIGH (ref 0.05–0.27)

## 2021-05-21 LAB — GLUCOSE, CAPILLARY: Glucose-Capillary: 92 mg/dL (ref 70–99)

## 2021-05-21 LAB — PHOSPHORUS: Phosphorus: 2.7 mg/dL (ref 2.5–4.6)

## 2021-05-21 MED ORDER — DEXTROSE IN LACTATED RINGERS 5 % IV SOLN: INTRAVENOUS | Status: DC

## 2021-05-21 MED ORDER — DEXTROSE 10 % IV SOLN: INTRAVENOUS | Status: DC

## 2021-05-21 MED ORDER — HYDROCORTISONE SOD SUC (PF) 100 MG IJ SOLR
100.0000 mg | Freq: Three times a day (TID) | INTRAMUSCULAR | Status: DC
  Administered 2021-05-22 (×2): 100 mg via INTRAVENOUS
  Filled 2021-05-21 (×4): qty 2

## 2021-05-21 MED ORDER — POTASSIUM CHLORIDE 10 MEQ/100ML IV SOLN: 10.0000 meq | INTRAVENOUS | Status: DC

## 2021-05-21 MED ORDER — LACTATED RINGERS IV SOLN: INTRAVENOUS | Status: DC

## 2021-05-21 MED ORDER — INSULIN ASPART 100 UNIT/ML IJ SOLN: 0.0000 [IU] | Freq: Three times a day (TID) | INTRAMUSCULAR | Status: DC

## 2021-05-21 MED ORDER — PROCHLORPERAZINE EDISYLATE 10 MG/2ML IJ SOLN
10.0000 mg | INTRAMUSCULAR | Status: DC | PRN
  Administered 2021-05-21: 02:00:00 10 mg via INTRAVENOUS
  Filled 2021-05-21: qty 2

## 2021-05-21 MED ORDER — INSULIN GLARGINE-YFGN 100 UNIT/ML ~~LOC~~ SOLN
5.0000 [IU] | Freq: Every day | SUBCUTANEOUS | Status: DC
  Administered 2021-05-21: 03:00:00 5 [IU] via SUBCUTANEOUS
  Filled 2021-05-21: qty 0.05

## 2021-05-21 MED ORDER — LACTATED RINGERS IV BOLUS: 20.0000 mL/kg | Freq: Once | INTRAVENOUS | Status: DC

## 2021-05-21 MED ORDER — METOCLOPRAMIDE HCL 5 MG/ML IJ SOLN
10.0000 mg | Freq: Once | INTRAMUSCULAR | Status: AC
  Administered 2021-05-21: 04:00:00 10 mg via INTRAVENOUS
  Filled 2021-05-21: qty 2

## 2021-05-21 MED ORDER — INSULIN REGULAR(HUMAN) IN NACL 100-0.9 UT/100ML-% IV SOLN: INTRAVENOUS | Status: DC

## 2021-05-21 MED ORDER — LORAZEPAM 2 MG/ML IJ SOLN
0.5000 mg | Freq: Once | INTRAMUSCULAR | Status: AC
  Administered 2021-05-21: 21:00:00 0.5 mg via INTRAVENOUS
  Filled 2021-05-21: qty 1

## 2021-05-21 MED ORDER — MIRTAZAPINE 15 MG PO TABS: 7.5000 mg | ORAL_TABLET | Freq: Every day | ORAL | Status: DC

## 2021-05-21 MED ORDER — ENOXAPARIN SODIUM 40 MG/0.4ML IJ SOSY
40.0000 mg | PREFILLED_SYRINGE | INTRAMUSCULAR | Status: DC
  Administered 2021-05-21 – 2021-05-25 (×5): 40 mg via SUBCUTANEOUS
  Filled 2021-05-21 (×5): qty 0.4

## 2021-05-21 MED ORDER — HYDROCORTISONE 5 MG PO TABS
5.0000 mg | ORAL_TABLET | Freq: Two times a day (BID) | ORAL | Status: DC
  Administered 2021-05-21: 10:00:00 5 mg via ORAL
  Filled 2021-05-21 (×2): qty 1

## 2021-05-21 MED ORDER — DEXTROSE 50 % IV SOLN: 0.0000 mL | INTRAVENOUS | Status: DC | PRN

## 2021-05-21 MED ORDER — INSULIN ASPART 100 UNIT/ML IJ SOLN
0.0000 [IU] | Freq: Three times a day (TID) | INTRAMUSCULAR | Status: DC
  Administered 2021-05-21: 13:00:00 2 [IU] via SUBCUTANEOUS
  Filled 2021-05-21: qty 1

## 2021-05-21 MED ORDER — LORAZEPAM 2 MG/ML IJ SOLN
0.5000 mg | Freq: Once | INTRAMUSCULAR | Status: AC
  Administered 2021-05-21: 07:00:00 0.5 mg via INTRAVENOUS
  Filled 2021-05-21: qty 1

## 2021-05-21 MED ORDER — LEVOTHYROXINE SODIUM 25 MCG PO TABS
25.0000 ug | ORAL_TABLET | Freq: Every day | ORAL | Status: DC
  Administered 2021-05-21 – 2021-05-25 (×5): 25 ug via ORAL
  Filled 2021-05-21 (×5): qty 1

## 2021-05-21 NOTE — ED Notes (Signed)
Lab at bedside

## 2021-05-21 NOTE — ED Notes (Signed)
Pt resting with eyes closed, respirations even and unlabored.

## 2021-05-21 NOTE — Progress Notes (Signed)
Inpatient Diabetes Program Recommendations  AACE/ADA: New Consensus Statement on Inpatient Glycemic Control   Target Ranges:  Prepandial:   less than 140 mg/dL      Peak postprandial:   less than 180 mg/dL (1-2 hours)      Critically ill patients:  140 - 180 mg/dL    Latest Reference Range & Units 05/21/21 01:55 05/21/21 03:23 05/21/21 05:28 05/21/21 05:43 05/21/21 06:19  Glucose-Capillary 70 - 99 mg/dL 742 (H)  30 (LL) 27 (LL) 188 (H)     Latest Reference Range & Units 05/20/21 21:44 05/20/21 22:42 05/21/21 00:24  Glucose-Capillary 70 - 99 mg/dL 595 (H) 638 (H) 756 (H)   Review of Glycemic Control  Diabetes history: DM1 (makes NO insulin; requires basal, correction, and carbohydrate coverage insulin) Outpatient Diabetes medications: T-slim insulin pump; per prior visit insulin pump settings should be: Basal 12A 0.2 units/hour 6A   0.3 units/hour Total Basal: 6.6 units per 24 hours Correction Factor 1:80 mg/dl (1 unit drops glucose 80 mg/dl) Carb Ratio 4:33 grams (1 unit covers 20 grams of carbs) Target glucose 110 mg/dl Current orders for Inpatient glycemic control: Semglee 5 units daily, Novolog 0-9 units AC&HS   Inpatient Diabetes Program Recommendations:     Insulin: Patient received Semglee 5 units at 3:23 am today and glucose down to 30 mg/dl at 2:95 am. Please consider discontinuing Semglee and Novolog 0-9 units AC&HS and order custom Novolog scale of 0-4 units Q4H. Custom Novolog correction scale 0-4 units       151-200  0 unit      201-250  1 units      251-300  2 units      301-350  3 units      351-400  4 units      401 or higher  Call provider  NOTE: Patient is well known to inpatient diabetes team and has had several admissions this year. Patient was most recently inpatient from 05/05/2021-05/10/2021. Patient has Type 1 DM and is very sensitive to insulin. Per note by Dr. Larinda Buttery on 05/20/21, " presents to the ED complaining of nausea and vomiting.  Patient  reports that she has been dealing with persistent nausea and vomiting for the past 4 days along with a small amount of diarrhea. She reports not being able to eat or drink anything at all over the past 48 hours.  She had been using her insulin pump as normal, but states she took it off sometime yesterday since she was not eating." Patient was ordered IV insulin for DKA and was transitioned to SQ insulin this morning; patient received Semglee 5 units at 3:23 am today and glucose down to 30 mg/dl 1:88. Would recommend to discontinue Semglee for now and use custom Novolog scale Q4H for correction.   Thanks, Orlando Penner, RN, MSN, CDE Diabetes Coordinator Inpatient Diabetes Program [phone] (Team Pager from 8am to 5pm)

## 2021-05-21 NOTE — ED Notes (Signed)
Pt resting comfortably in bed, NAD. NO needs verbalized at this time. Bed low & locked; call light & personal items within reach.

## 2021-05-21 NOTE — ED Notes (Signed)
IPNP notified of critical low BG, pt awake and alert, drank 8oz apple juice and eating graham crackers at this time.

## 2021-05-21 NOTE — ED Notes (Signed)
Pt resting comfortably in bed, NAD. No needs verbalized at this time. Bed low & locked; call light & personal items within reach. °

## 2021-05-21 NOTE — Progress Notes (Signed)
Cross Cover Patient transitioned off insulin drip but ongoing nausea preventing oral intake with subsequent drop in blood sugars. Hypoglycemia treated and D10, along with maintenance fluids ordered. Close CBG monitoring ongoing. EKG to eval QTc secondary to multiple medications needed for nausea treatment.  QTc 611.  Prolonging QT meds discontinued. Dose of ativan for nausea ordered

## 2021-05-21 NOTE — ED Notes (Signed)
Pt given apple juice for blood sugar of 33. Pt requested apple juice instead of the D50 at this time.

## 2021-05-21 NOTE — ED Notes (Signed)
Recheck on BGL 64. Pt does not want more juice & also refuses D50.

## 2021-05-21 NOTE — Progress Notes (Addendum)
Progress Note    SILAH KERCHNER  [id number] DOB: October 24, 1990  DOA: 05/20/2021 PCP: Pcp, No      Brief Narrative:    Medical records reviewed and are as summarized below:  DANEQUA AMOR is a 30 y.o. female with medical history significant for type I DM on insulin pump, adrenal insufficiency, peripheral neuropathy, depression, hypertension, diabetic gastroparesis, recent hypoglycemic episodes requiring intubation on 04/27/2021, recent hospitalization for community-acquired pneumonia from 05/05/2021 through 05/10/2021.  She presented to the hospital because of nausea, vomiting, diarrhea, poor oral intake and generalized weakness.    Assessment/Plan:   Principal Problem:   DKA, type 1 (HCC)    Body mass index is 21.73 kg/m.  Type I DKA: DKA has resolved and insulin drip has been discontinued.  Hypoglycemia: She received insulin glargine 5 units early this morning.  Insulin glargine has been discontinued.  She was treated with IV 10% dextrose but this has been discontinued because glucose levels are increasing.  Use NovoLog as needed for hyperglycemia.  Monitor glucose levels closely.  Nausea and vomiting: Continue antiemetics as needed.  Prolonged QTc interval (601): Repeat ECG tomorrow.  Reglan and Zofran have been discontinued.  Adrenal insufficiency: This was confirmed by ACTH test on January 16, 2021.  Continue hydrocortisone.  Hypothyroidism: Continue Synthroid  Depression: Patient said she no longer takes Remeron.  CKD stage IIIa: Creatinine is stable.   Diet Order             Diet Carb Modified Fluid consistency: Thin; Room service appropriate? Yes  Diet effective now                      Consultants: None  Procedures: None    Medications:    enoxaparin (LOVENOX) injection  40 mg Subcutaneous Q24H   hydrocortisone  5 mg Oral BID   insulin aspart  0-6 Units Subcutaneous TID WC   levothyroxine  25 mcg Oral Q0600   Continuous  Infusions:   Anti-infectives (From admission, onward)    None              Family Communication/Anticipated D/C date and plan/Code Status   DVT prophylaxis: enoxaparin (LOVENOX) injection 40 mg Start: 05/21/21 0900 SCDs Start: 05/21/21 [id number]     Code Status: Full Code  Family Communication: None Disposition Plan: Possible discharge to home in 1 to 2 days   Status is: Inpatient  Remains inpatient appropriate because: Fluctuating glucose levels           Subjective:   C/o nausea and vomiting.  She does not provide much history.  She said she feels "the same".  Objective:    Vitals:   05/21/21 1230 05/21/21 1300 05/21/21 1400 05/21/21 1500  BP: 109/83 111/81 107/87 103/78  Pulse: 90 88 92 93  Resp: (!) 23 (!) 22 (!) 22 (!) 23  Temp:      TempSrc:      SpO2: 100% 92% 97% 98%  Weight:      Height:       No data found.   Intake/Output Summary (Last 24 hours) at 05/21/2021 1642 Last data filed at 05/21/2021 1310 Gross per 24 hour  Intake 1499.94 ml  Output --  Net 1499.94 ml   Filed Weights   05/19/21 2130  Weight: 52.2 kg    Exam:  GEN: NAD SKIN: No rash EYES: EOMI ENT: MMM CV: RRR PULM: CTA B ABD: soft, ND, NT, +BS CNS: AAO x  3, non focal EXT: No edema or tenderness        Data Reviewed:   I have personally reviewed following labs and imaging studies:  Labs: Labs show the following:   Basic Metabolic Panel: Recent Labs  Lab 05/20/21 0749 05/20/21 0951 05/20/21 1340 05/20/21 1802 05/21/21 0152 05/21/21 0845  NA 140 138 139 141 137 136  K 4.6 4.8 4.3 4.3 3.9 4.3  CL 103 104 106 108 108 108  CO2 13* 12* 16* 24 25 22   GLUCOSE 181* 204* 197* 123* 140* 194*  BUN 34* 32* 28* 28* 27* 25*  CREATININE 1.55* 1.56* 1.47* 1.35* 1.52* 1.45*  CALCIUM 8.6* 8.3* 8.1* 8.3* 8.1* 7.7*  MG 2.2  --   --   --   --   --   PHOS  --   --   --   --  2.7  --    GFR Estimated Creatinine Clearance: 43.2 mL/min (A) (by C-G formula  based on SCr of 1.45 mg/dL (H)). Liver Function Tests: Recent Labs  Lab 05/19/21 2136  AST 46*  ALT 38  ALKPHOS 79  BILITOT 1.4*  PROT 7.2  ALBUMIN 4.1   Recent Labs  Lab 05/19/21 2136  LIPASE 23   No results for input(s): AMMONIA in the last 168 hours. Coagulation profile No results for input(s): INR, PROTIME in the last 168 hours.  CBC: Recent Labs  Lab 05/19/21 2136  WBC 7.9  HGB 9.1*  HCT 28.1*  MCV 95.6  PLT 207   Cardiac Enzymes: No results for input(s): CKTOTAL, CKMB, CKMBINDEX, TROPONINI in the last 168 hours. BNP (last 3 results) No results for input(s): PROBNP in the last 8760 hours. CBG: Recent Labs  Lab 05/21/21 0729 05/21/21 0823 05/21/21 0937 05/21/21 1024 05/21/21 1216  GLUCAP 191* 181* 179* 192* 213*   D-Dimer: No results for input(s): DDIMER in the last 72 hours. Hgb A1c: No results for input(s): HGBA1C in the last 72 hours. Lipid Profile: No results for input(s): CHOL, HDL, LDLCALC, TRIG, CHOLHDL, LDLDIRECT in the last 72 hours. Thyroid function studies: No results for input(s): TSH, T4TOTAL, T3FREE, THYROIDAB in the last 72 hours.  Invalid input(s): FREET3 Anemia work up: No results for input(s): VITAMINB12, FOLATE, FERRITIN, TIBC, IRON, RETICCTPCT in the last 72 hours. Sepsis Labs: Recent Labs  Lab 05/19/21 2136  WBC 7.9    Microbiology Recent Results (from the past 240 hour(s))  Resp Panel by RT-PCR (Flu A&B, Covid) Nasopharyngeal Swab     Status: None   Collection Time: 05/20/21  7:56 AM   Specimen: Nasopharyngeal Swab; Nasopharyngeal(NP) swabs in vial transport medium  Result Value Ref Range Status   SARS Coronavirus 2 by RT PCR NEGATIVE NEGATIVE Final    Comment: (NOTE) SARS-CoV-2 target nucleic acids are NOT DETECTED.  The SARS-CoV-2 RNA is generally detectable in upper respiratory specimens during the acute phase of infection. The lowest concentration of SARS-CoV-2 viral copies this assay can detect is 138  copies/mL. A negative result does not preclude SARS-Cov-2 infection and should not be used as the sole basis for treatment or other patient management decisions. A negative result may occur with  improper specimen collection/handling, submission of specimen other than nasopharyngeal swab, presence of viral mutation(s) within the areas targeted by this assay, and inadequate number of viral copies(<138 copies/mL). A negative result must be combined with clinical observations, patient history, and epidemiological information. The expected result is Negative.  Fact Sheet for Patients:  05/22/21  Fact Sheet  for Healthcare Providers:  IncredibleEmployment.be  This test is no t yet approved or cleared by the [place] and  has been authorized for detection and/or diagnosis of SARS-CoV-2 by FDA under an Emergency Use Authorization (EUA). This EUA will remain  in effect (meaning this test can be used) for the duration of the COVID-19 declaration under Section 564(b)(1) of the Act, 21 U.S.C.section 360bbb-3(b)(1), unless the authorization is terminated  or revoked sooner.       Influenza A by PCR NEGATIVE NEGATIVE Final   Influenza B by PCR NEGATIVE NEGATIVE Final    Comment: (NOTE) The Xpert Xpress SARS-CoV-2/FLU/RSV plus assay is intended as an aid in the diagnosis of influenza from Nasopharyngeal swab specimens and should not be used as a sole basis for treatment. Nasal washings and aspirates are unacceptable for Xpert Xpress SARS-CoV-2/FLU/RSV testing.  Fact Sheet for Patients: EntrepreneurPulse.com.au  Fact Sheet for Healthcare Providers: IncredibleEmployment.be  This test is not yet approved or cleared by the [place] FDA and has been authorized for detection and/or diagnosis of SARS-CoV-2 by FDA under an Emergency Use Authorization (EUA). This EUA will remain in effect (meaning  this test can be used) for the duration of the COVID-19 declaration under Section 564(b)(1) of the Act, 21 U.S.C. section 360bbb-3(b)(1), unless the authorization is terminated or revoked.  Performed at [facility], [address] [place], [place] [address]     Procedures and diagnostic studies:  No results found.             LOS: 1 day   Zaiyah Sottile  [facility]   Pager on [email or web address]. If 7PM-7AM, please contact night-coverage at [email or web address]     05/21/2021, 4:42 PM

## 2021-05-21 NOTE — ED Notes (Addendum)
Messaged IPNP 'anion gap 4, phos 2.7.pt sleeping, I think the compazine worked, please advise on transition.'

## 2021-05-21 NOTE — ED Notes (Signed)
Pt not able to eat the crackers but drank another 4 oz juice for total of 12oz.  IPNP notified.

## 2021-05-21 NOTE — ED Notes (Signed)
Dr Ayiku at bedside 

## 2021-05-21 NOTE — ED Notes (Signed)
Per Dr. Myriam Forehand, stop D10 infusion.

## 2021-05-21 NOTE — ED Notes (Addendum)
Secure msg sent to Dr. Myriam Forehand re: last several blood sugar checks being elevated, most recent 213 & do they still want the D10 running.

## 2021-05-22 ENCOUNTER — Other Ambulatory Visit: Payer: Self-pay

## 2021-05-22 LAB — BASIC METABOLIC PANEL
Anion gap: 9 (ref 5–15)
BUN: 24 mg/dL — ABNORMAL HIGH (ref 6–20)
CO2: 23 mmol/L (ref 22–32)
Calcium: 8.1 mg/dL — ABNORMAL LOW (ref 8.9–10.3)
Chloride: 106 mmol/L (ref 98–111)
Creatinine, Ser: 1.65 mg/dL — ABNORMAL HIGH (ref 0.44–1.00)
GFR, Estimated: 43 mL/min — ABNORMAL LOW (ref >60)
Glucose, Bld: 72 mg/dL (ref 70–99)
Potassium: 4.6 mmol/L (ref 3.5–5.1)
Sodium: 138 mmol/L (ref 135–145)

## 2021-05-22 LAB — GLUCOSE, CAPILLARY
Glucose-Capillary: 115 mg/dL — ABNORMAL HIGH (ref 70–99)
Glucose-Capillary: 190 mg/dL — ABNORMAL HIGH (ref 70–99)
Glucose-Capillary: 377 mg/dL — ABNORMAL HIGH (ref 70–99)
Glucose-Capillary: 411 mg/dL — ABNORMAL HIGH (ref 70–99)

## 2021-05-22 MED ORDER — SODIUM CHLORIDE 0.9 % IV SOLN
12.5000 mg | Freq: Four times a day (QID) | INTRAVENOUS | Status: DC | PRN
  Administered 2021-05-23 (×2): 12.5 mg via INTRAVENOUS
  Filled 2021-05-22 (×4): qty 0.5

## 2021-05-22 MED ORDER — LACTATED RINGERS IV SOLN
INTRAVENOUS | Status: DC
Start: 2021-05-22 — End: 2021-05-24

## 2021-05-22 MED ORDER — INSULIN ASPART 100 UNIT/ML IJ SOLN
0.0000 [IU] | Freq: Four times a day (QID) | INTRAMUSCULAR | 0 refills | Status: DC
  Filled 2021-05-22: qty 5, 32d supply, fill #0

## 2021-05-22 MED ORDER — LORAZEPAM 2 MG/ML IJ SOLN
0.5000 mg | Freq: Once | INTRAMUSCULAR | Status: AC
  Administered 2021-05-22: 08:00:00 0.5 mg via INTRAVENOUS
  Filled 2021-05-22: qty 1

## 2021-05-22 MED ORDER — HYDROCORTISONE 10 MG PO TABS
10.0000 mg | ORAL_TABLET | ORAL | Status: DC
  Administered 2021-05-23 – 2021-05-25 (×3): 10 mg via ORAL
  Filled 2021-05-22 (×4): qty 1

## 2021-05-22 MED ORDER — PROCHLORPERAZINE EDISYLATE 10 MG/2ML IJ SOLN
5.0000 mg | Freq: Once | INTRAMUSCULAR | Status: AC
  Administered 2021-05-22: 16:00:00 5 mg via INTRAVENOUS
  Filled 2021-05-22 (×2): qty 1

## 2021-05-22 MED ORDER — HYDROCORTISONE 5 MG PO TABS
5.0000 mg | ORAL_TABLET | Freq: Every day | ORAL | Status: DC
  Administered 2021-05-22 – 2021-05-25 (×4): 5 mg via ORAL
  Filled 2021-05-22 (×5): qty 1

## 2021-05-22 MED ORDER — HYDROCORTISONE 10 MG PO TABS
10.0000 mg | ORAL_TABLET | ORAL | 0 refills | Status: DC
  Filled 2021-05-22: qty 30, 30d supply, fill #0

## 2021-05-22 MED ORDER — ALUM & MAG HYDROXIDE-SIMETH 200-200-20 MG/5ML PO SUSP
30.0000 mL | Freq: Four times a day (QID) | ORAL | Status: DC | PRN
  Administered 2021-05-22 – 2021-05-24 (×5): 30 mL via ORAL
  Filled 2021-05-22 (×5): qty 30

## 2021-05-22 MED ORDER — HYDROCORTISONE 5 MG PO TABS
5.0000 mg | ORAL_TABLET | Freq: Every day | ORAL | 0 refills | Status: DC
  Filled 2021-05-22: qty 30, 30d supply, fill #0

## 2021-05-22 MED ORDER — INSULIN ASPART 100 UNIT/ML IJ SOLN
0.0000 [IU] | Freq: Four times a day (QID) | INTRAMUSCULAR | Status: DC
  Administered 2021-05-23 (×3): 2 [IU] via SUBCUTANEOUS
  Administered 2021-05-23: 4 [IU] via SUBCUTANEOUS
  Administered 2021-05-24: 1 [IU] via SUBCUTANEOUS
  Administered 2021-05-24: 2 [IU] via SUBCUTANEOUS
  Filled 2021-05-22 (×6): qty 1

## 2021-05-22 NOTE — TOC Initial Note (Signed)
Transition of Care [facility]) - Initial/Assessment Note    Patient Details  Name: Ashley Camacho MRN: [id number] Date of Birth: August 25, 1990  [facility]) CM/SW Contact:    Liliana Cline, LCSW Phone Number: 05/22/2021, 12:46 PM  Clinical Narrative:                 Completed assessment with patient.  Assessment also completed by [facility] 12/15. No changes since that assessment. Patient still lives with her boyfriend and minor children. Boyfriend provides transportation. Patient says she does not have a PCP but goes to her Endocrinologist- [employer] [facility]- regularly. Has an insulin pump and dexcom.  [facility] [address] [facility]. Says she also uses a mail order pharmacy through the Endocrinology office. Patient denies needs at this time.  TOC to follow for needs.  Expected Discharge Plan: Home/Self Care Barriers to Discharge: Continued Medical Work up   Patient Goals and CMS Choice Patient states their goals for this hospitalization and ongoing recovery are:: home with significant other CMS Medicare.gov Compare Post Acute Care list provided to:: Patient Choice offered to / list presented to : Patient  Expected Discharge Plan and Services Expected Discharge Plan: Home/Self Care       Living arrangements for the past 2 months: Single Family Home                                      Prior Living Arrangements/Services Living arrangements for the past 2 months: Single Family Home Lives with:: Minor Children, Significant Other Patient language and need for interpreter reviewed:: Yes Do you feel safe going back to the place where you live?: Yes      Need for Family Participation in Patient Care: Yes (Comment) Care giver support system in place?: Yes (comment)   Criminal Activity/Legal Involvement Pertinent to Current Situation/Hospitalization: No - Comment as needed  Activities of Daily Living Home Assistive Devices/Equipment: None ADL  Screening (condition at time of admission) Patient's cognitive ability adequate to safely complete daily activities?: Yes Is the patient deaf or have difficulty hearing?: No Does the patient have difficulty seeing, even when wearing glasses/contacts?: No Does the patient have difficulty concentrating, remembering, or making decisions?: No Patient able to express need for assistance with ADLs?: Yes Does the patient have difficulty dressing or bathing?: No Independently performs ADLs?: Yes (appropriate for developmental age) Does the patient have difficulty walking or climbing stairs?: No Weakness of Legs: None Weakness of Arms/Hands: None  Permission Sought/Granted Permission sought to share information with : Other (comment), Family Supports Permission granted to share information with : Yes, Verbal Permission Granted              Emotional Assessment       Orientation: : Oriented to Self, Oriented to Place, Oriented to  Time, Oriented to Situation Alcohol / Substance Use: Not Applicable Psych Involvement: No (comment)  Admission diagnosis:  DKA, type 1 (HCC) [E10.10] Intractable nausea and vomiting [R11.2] Diabetic ketoacidosis without coma associated with type 1 diabetes mellitus (HCC) [E10.10] Patient Active Problem List   Diagnosis Date Noted   DKA, type 1 (HCC) 05/20/2021   Moderate recurrent major depression (HCC) 05/08/2021   Right arm pain 05/07/2021   Rash of hand 05/07/2021   Hyperkalemia 05/06/2021   History of adrenal insufficiency 05/06/2021   CAP (community acquired pneumonia) 05/05/2021   Hypoglycemia 04/27/2021   Postoperative  hematoma involving genitourinary system following genitourinary procedure    S/P cesarean section 02/17/2021   Acute esophagitis    Leg edema    History of preterm delivery, currently pregnant    Pre-existing type 1 diabetes mellitus during pregnancy in third trimester    Anemia during pregnancy    Type 1 diabetes mellitus  affecting pregnancy in second trimester, antepartum    HFrEF (heart failure with reduced ejection fraction) (HCC)    Acute deep vein thrombosis (DVT) of brachial vein of right upper extremity (HCC) 12/17/2020   Anxiety 12/17/2020   Diabetic retinopathy (HCC) 12/17/2020   Acute on chronic combined systolic and diastolic CHF (congestive heart failure) (HCC)    Hyperemesis 12/13/2020   Type 1 diabetes mellitus with hypoglycemia without coma (HCC) 12/13/2020   Sunburn 12/13/2020   Hypothyroidism 12/10/2020   Malnutrition (HCC) 12/10/2020   Vitreous hemorrhage of right eye (HCC) 12/10/2020   Pleural effusion associated with pulmonary infection 11/25/2020   Pneumonia affecting pregnancy 11/24/2020   Diabetes mellitus affecting pregnancy, second trimester 11/24/2020   Edema 11/24/2020   Decreased urine output 11/24/2020   Shortness of breath    Zinc deficiency    SOB (shortness of breath)    Social problem 11/21/2020   Nausea and vomiting during pregnancy prior to [redacted] weeks gestation 11/21/2020   Low serum vitamin B12    Delivery with history of C-section 11/20/2020   Absolute anemia    Acute kidney injury (HCC)    Nausea and vomiting during pregnancy 10/23/2020   History of premature delivery, currently pregnant, second trimester 10/13/2020   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 [place] [address] Phone: [phone] Fax: [phone]  [facility] [address] [place] [address] Phone: [phone] Fax: [phone]     Social Determinants of Health (SDOH) Interventions    Readmission Risk  Interventions Readmission Risk Prevention Plan 05/22/2021 05/07/2021  Transportation Screening Complete Complete  PCP or Specialist Appt within 3-5 Days Complete Complete  HRI or Home Care Consult Complete Complete  Social Work Consult for Recovery Care Planning/Counseling Complete Complete  Palliative Care Screening Not Applicable Not Applicable  Medication Review Oceanographer) Complete Referral to Pharmacy  Some recent data might be hidden
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
## 2021-05-22 NOTE — Progress Notes (Signed)
Progress Note    Ashley Camacho  [id number] DOB: 03-21-1991  DOA: 05/20/2021 PCP: Pcp, No      Brief Narrative:    Medical records reviewed and are as summarized below:  Ashley Camacho is a 30 y.o. female with medical history significant for type I DM on insulin pump, adrenal insufficiency, peripheral neuropathy, depression, hypertension, diabetic gastroparesis, recent hypoglycemic episodes requiring intubation on 04/27/2021, recent hospitalization for community-acquired pneumonia from 05/05/2021 through 05/10/2021.  She presented to the hospital because of nausea, vomiting, diarrhea, poor oral intake and generalized weakness.    Assessment/Plan:   Principal Problem:   DKA, type 1 (HCC)    Body mass index is 21.73 kg/m.  Type I DKA: DKA has resolved.  Recurrent hypoglycemia: Use NovoLog as needed for hyperglycemia.  Case was discussed with Mathis Bud, diabetic coordinator.  She said she had contacted Jerene Pitch, and nurse at Dr. Mitchel Honour office.  Dr. Barry Brunner is patient's endocrinologist.  Because of recurrent hypoglycemia with insulin pump at home, decision has been made to discharge patient on a low-dose sliding scale.   Nausea and poor oral intake: Restart IV fluids at a low rate.  And appears vomiting is improved.  He is antiemetics as needed.  Prolonged QTc interval (601): Repeat EKG is pending.    Adrenal insufficiency: This was confirmed by ACTH test on January 16, 2021.  Change IV to oral hydrocortisone  Hypothyroidism: Continue Synthroid  Depression: Patient said she she no longer takes Remeron  CKD stage IIIa: Creatinine is stable.   Diet Order             Diet Carb Modified Fluid consistency: Thin; Room service appropriate? Yes  Diet effective now                      Consultants: None  Procedures: None    Medications:    enoxaparin (LOVENOX) injection  40 mg Subcutaneous Q24H   [START ON 05/23/2021] hydrocortisone  10 mg Oral BH-q7a    hydrocortisone  5 mg Oral Q1500   insulin aspart  0-4 Units Subcutaneous Q6H   levothyroxine  25 mcg Oral Q0600   Continuous Infusions:  lactated ringers       Anti-infectives (From admission, onward)    None              Family Communication/Anticipated D/C date and plan/Code Status   DVT prophylaxis: enoxaparin (LOVENOX) injection 40 mg Start: 05/21/21 0900 SCDs Start: 05/21/21 [id number]     Code Status: Full Code  Family Communication: None Disposition Plan: Possible discharge to home in tomorrow   Status is: Inpatient  Remains inpatient appropriate because: Fluctuating glucose levels, nausea and poor oral intake           Subjective:   C/o nausea and poor oral intake.  Glucose dropped to 33 last night.  Objective:    Vitals:   05/22/21 0210 05/22/21 0816 05/22/21 1133 05/22/21 1615  BP: (!) 117/91 (!) 125/96 (!) 126/93 (!) 126/93  Pulse: 96 (!) 104 (!) 102 (!) 102  Resp: 16 20 17 17   Temp: 98.4 F (36.9 C) 98.2 F (36.8 C) 98.6 F (37 C) 98.2 F (36.8 C)  TempSrc:      SpO2: 99% 98% 97% 100%  Weight:      Height:       No data found.   Intake/Output Summary (Last 24 hours) at 05/22/2021 1729 Last data filed at 05/22/2021 0300 Gross  per 24 hour  Intake 18.02 ml  Output --  Net 18.02 ml   Filed Weights   05/19/21 2130  Weight: 52.2 kg    Exam:  GEN: NAD SKIN: No rash EYES: EOMI ENT: MMM CV: RRR PULM: CTA B ABD: soft, ND, NT, +BS CNS: AAO x 3, non focal EXT: No edema or tenderness         Data Reviewed:   I have personally reviewed following labs and imaging studies:  Labs: Labs show the following:   Basic Metabolic Panel: Recent Labs  Lab 05/20/21 0749 05/20/21 0951 05/20/21 1340 05/20/21 1802 05/21/21 0152 05/21/21 0845 05/22/21 0616  NA 140   < > 139 141 137 136 138  K 4.6   < > 4.3 4.3 3.9 4.3 4.6  CL 103   < > 106 108 108 108 106  CO2 13*   < > 16* 24 25 22 23   GLUCOSE 181*   < > 197* 123*  140* 194* 72  BUN 34*   < > 28* 28* 27* 25* 24*  CREATININE 1.55*   < > 1.47* 1.35* 1.52* 1.45* 1.65*  CALCIUM 8.6*   < > 8.1* 8.3* 8.1* 7.7* 8.1*  MG 2.2  --   --   --   --   --   --   PHOS  --   --   --   --  2.7  --   --    < > = values in this interval not displayed.   GFR Estimated Creatinine Clearance: 37.6 mL/min (A) (by C-G formula based on SCr of 1.65 mg/dL (H)). Liver Function Tests: Recent Labs  Lab 05/19/21 2136  AST 46*  ALT 38  ALKPHOS 79  BILITOT 1.4*  PROT 7.2  ALBUMIN 4.1   Recent Labs  Lab 05/19/21 2136  LIPASE 23   No results for input(s): AMMONIA in the last 168 hours. Coagulation profile No results for input(s): INR, PROTIME in the last 168 hours.  CBC: Recent Labs  Lab 05/19/21 2136  WBC 7.9  HGB 9.1*  HCT 28.1*  MCV 95.6  PLT 207   Cardiac Enzymes: No results for input(s): CKTOTAL, CKMB, CKMBINDEX, TROPONINI in the last 168 hours. BNP (last 3 results) No results for input(s): PROBNP in the last 8760 hours. CBG: Recent Labs  Lab 05/21/21 1833 05/21/21 1907 05/21/21 2028 05/22/21 0816 05/22/21 1132  GLUCAP 33* 64* 92 115* 190*   D-Dimer: No results for input(s): DDIMER in the last 72 hours. Hgb A1c: No results for input(s): HGBA1C in the last 72 hours. Lipid Profile: No results for input(s): CHOL, HDL, LDLCALC, TRIG, CHOLHDL, LDLDIRECT in the last 72 hours. Thyroid function studies: No results for input(s): TSH, T4TOTAL, T3FREE, THYROIDAB in the last 72 hours.  Invalid input(s): FREET3 Anemia work up: No results for input(s): VITAMINB12, FOLATE, FERRITIN, TIBC, IRON, RETICCTPCT in the last 72 hours. Sepsis Labs: Recent Labs  Lab 05/19/21 2136  WBC 7.9    Microbiology Recent Results (from the past 240 hour(s))  Resp Panel by RT-PCR (Flu A&B, Covid) Nasopharyngeal Swab     Status: None   Collection Time: 05/20/21  7:56 AM   Specimen: Nasopharyngeal Swab; Nasopharyngeal(NP) swabs in vial transport medium  Result Value Ref  Range Status   SARS Coronavirus 2 by RT PCR NEGATIVE NEGATIVE Final    Comment: (NOTE) SARS-CoV-2 target nucleic acids are NOT DETECTED.  The SARS-CoV-2 RNA is generally detectable in upper respiratory specimens during the acute  phase of infection. The lowest concentration of SARS-CoV-2 viral copies this assay can detect is 138 copies/mL. A negative result does not preclude SARS-Cov-2 infection and should not be used as the sole basis for treatment or other patient management decisions. A negative result may occur with  improper specimen collection/handling, submission of specimen other than nasopharyngeal swab, presence of viral mutation(s) within the areas targeted by this assay, and inadequate number of viral copies(<138 copies/mL). A negative result must be combined with clinical observations, patient history, and epidemiological information. The expected result is Negative.  Fact Sheet for Patients:  BloggerCourse.com  Fact Sheet for Healthcare Providers:  SeriousBroker.it  This test is no t yet approved or cleared by the [place] FDA and  has been authorized for detection and/or diagnosis of SARS-CoV-2 by FDA under an Emergency Use Authorization (EUA). This EUA will remain  in effect (meaning this test can be used) for the duration of the COVID-19 declaration under Section 564(b)(1) of the Act, 21 U.S.C.section 360bbb-3(b)(1), unless the authorization is terminated  or revoked sooner.       Influenza A by PCR NEGATIVE NEGATIVE Final   Influenza B by PCR NEGATIVE NEGATIVE Final    Comment: (NOTE) The Xpert Xpress SARS-CoV-2/FLU/RSV plus assay is intended as an aid in the diagnosis of influenza from Nasopharyngeal swab specimens and should not be used as a sole basis for treatment. Nasal washings and aspirates are unacceptable for Xpert Xpress SARS-CoV-2/FLU/RSV testing.  Fact Sheet for  Patients: BloggerCourse.com  Fact Sheet for Healthcare Providers: SeriousBroker.it  This test is not yet approved or cleared by the [place] FDA and has been authorized for detection and/or diagnosis of SARS-CoV-2 by FDA under an Emergency Use Authorization (EUA). This EUA will remain in effect (meaning this test can be used) for the duration of the COVID-19 declaration under Section 564(b)(1) of the Act, 21 U.S.C. section 360bbb-3(b)(1), unless the authorization is terminated or revoked.  Performed at [facility], [address] [place], [place] [address]     Procedures and diagnostic studies:  No results found.             LOS: 2 days   Keerthi Hazell  [facility]   Pager on [email or web address]. If 7PM-7AM, please contact night-coverage at [email or web address]     05/22/2021, 5:29 PM
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
## 2021-05-23 DIAGNOSIS — E875 Hyperkalemia: Secondary | ICD-10-CM

## 2021-05-23 DIAGNOSIS — E162 Hypoglycemia, unspecified: Secondary | ICD-10-CM

## 2021-05-23 DIAGNOSIS — N179 Acute kidney failure, unspecified: Secondary | ICD-10-CM

## 2021-05-23 LAB — BASIC METABOLIC PANEL
Anion gap: 8 (ref 5–15)
BUN: 32 mg/dL — ABNORMAL HIGH (ref 6–20)
CO2: 22 mmol/L (ref 22–32)
Calcium: 8 mg/dL — ABNORMAL LOW (ref 8.9–10.3)
Chloride: 105 mmol/L (ref 98–111)
Creatinine, Ser: 1.86 mg/dL — ABNORMAL HIGH (ref 0.44–1.00)
GFR, Estimated: 37 mL/min — ABNORMAL LOW (ref >60)
Glucose, Bld: 321 mg/dL — ABNORMAL HIGH (ref 70–99)
Potassium: 5.3 mmol/L — ABNORMAL HIGH (ref 3.5–5.1)
Sodium: 135 mmol/L (ref 135–145)

## 2021-05-23 LAB — GLUCOSE, CAPILLARY
Glucose-Capillary: 319 mg/dL — ABNORMAL HIGH (ref 70–99)
Glucose-Capillary: 279 mg/dL — ABNORMAL HIGH (ref 70–99)
Glucose-Capillary: 268 mg/dL — ABNORMAL HIGH (ref 70–99)
Glucose-Capillary: 263 mg/dL — ABNORMAL HIGH (ref 70–99)

## 2021-05-23 MED ORDER — SODIUM ZIRCONIUM CYCLOSILICATE 10 G PO PACK
10.0000 g | PACK | Freq: Two times a day (BID) | ORAL | Status: AC
  Administered 2021-05-23 (×2): 10 g via ORAL
  Filled 2021-05-23 (×2): qty 1

## 2021-05-23 NOTE — Progress Notes (Addendum)
Progress Note    Ashley Camacho  [id number] DOB: 1990-06-25  DOA: 05/20/2021 PCP: Pcp, No      Brief Narrative:    Medical records reviewed and are as summarized below:  Ashley Camacho is a 30 y.o. female with medical history significant for type I DM on insulin pump, adrenal insufficiency, peripheral neuropathy, depression, hypertension, diabetic gastroparesis, recent hypoglycemic episodes requiring intubation on 04/27/2021, recent hospitalization for community-acquired pneumonia from 05/05/2021 through 05/10/2021.  She presented to the hospital because of nausea, vomiting, diarrhea, poor oral intake and generalized weakness.    Assessment/Plan:   Principal Problem:   DKA, type 1 ([place]) Active Problems:   Acute kidney injury ([place])   Hypoglycemia   Hyperkalemia    Body mass index is 21.73 kg/m.  Type I DKA, hyperglycemia: DKA has resolved.  Recurrent hypoglycemia: Use NovoLog as needed for hyperglycemia.  Patient will no longer continue with insulin pump at discharge.  She will be discharged on very low-dose NovoLog sliding scale.  She will follow-up with her endocrinologist, Dr. Barry Brunner, for further recommendations.  Nausea and poor oral intake: Continue IV fluids and antiemetics.  AKI on probable CKD stage IIIa, Hyperkalemia: Continue IV fluids but increase rate from 50 mL/h to 100 mL/h.  Start Lokelma for hyperkalemia.  Pete BMP tomorrow.  Prolonged QTc interval (601): Improved to 435 on EKG done on 05/22/2021.  Adrenal insufficiency: This was confirmed by ACTH test on January 16, 2021.  Continue hydrocortisone.  Hypothyroidism: Continue Synthroid  Depression: Patient said she she no longer takes Remeron  CKD stage IIIa: Creatinine is stable.   Diet Order             Diet Carb Modified Fluid consistency: Thin; Room service appropriate? Yes  Diet effective now                      Consultants: None  Procedures: None    Medications:     enoxaparin (LOVENOX) injection  40 mg Subcutaneous Q24H   hydrocortisone  10 mg Oral BH-q7a   hydrocortisone  5 mg Oral Q1500   insulin aspart  0-4 Units Subcutaneous Q6H   levothyroxine  25 mcg Oral Q0600   sodium zirconium cyclosilicate  10 g Oral BID   Continuous Infusions:  lactated ringers 100 mL/hr at 05/23/21 0844   promethazine (PHENERGAN) injection (IM or IVPB) 12.5 mg (05/23/21 1103)     Anti-infectives (From admission, onward)    None              Family Communication/Anticipated D/C date and plan/Code Status   DVT prophylaxis: enoxaparin (LOVENOX) injection 40 mg Start: 05/21/21 0900 SCDs Start: 05/21/21 [id number]     Code Status: Full Code  Family Communication: None Disposition Plan: Possible discharge to home in tomorrow   Status is: Inpatient  Remains inpatient appropriate because: Fluctuating glucose levels, nausea and poor oral intake           Subjective:   Interval events noted.  She still complains of nausea.  No vomiting or abdominal pain.  Objective:    Vitals:   05/22/21 1935 05/22/21 2342 05/23/21 0333 05/23/21 0820  BP: 117/86 100/84 113/81 111/88  Pulse: 98 98 94 93  Resp: 15 16 15 15   Temp: 98.3 F (36.8 C) 98 F (36.7 C) 98 F (36.7 C) 99 F (37.2 C)  TempSrc:      SpO2: 99% 99% 96% 97%  Weight:  Height:       No data found.   Intake/Output Summary (Last 24 hours) at 05/23/2021 1218 Last data filed at 05/23/2021 0300 Gross per 24 hour  Intake 474.22 ml  Output --  Net 474.22 ml   Filed Weights   05/19/21 2130  Weight: 52.2 kg    Exam:  GEN: NAD SKIN: No rash EYES: EOMI ENT: MMM CV: RRR PULM: CTA B ABD: soft, ND, NT, +BS CNS: AAO x 3, non focal EXT: No edema or tenderness         Data Reviewed:   I have personally reviewed following labs and imaging studies:  Labs: Labs show the following:   Basic Metabolic Panel: Recent Labs  Lab 05/20/21 0749 05/20/21 0951  05/20/21 1802 05/21/21 0152 05/21/21 0845 05/22/21 0616 05/23/21 0525  NA 140   < > 141 137 136 138 135  K 4.6   < > 4.3 3.9 4.3 4.6 5.3*  CL 103   < > 108 108 108 106 105  CO2 13*   < > 24 25 22 23 22   GLUCOSE 181*   < > 123* 140* 194* 72 321*  BUN 34*   < > 28* 27* 25* 24* 32*  CREATININE 1.55*   < > 1.35* 1.52* 1.45* 1.65* 1.86*  CALCIUM 8.6*   < > 8.3* 8.1* 7.7* 8.1* 8.0*  MG 2.2  --   --   --   --   --   --   PHOS  --   --   --  2.7  --   --   --    < > = values in this interval not displayed.   GFR Estimated Creatinine Clearance: 33.4 mL/min (A) (by C-G formula based on SCr of 1.86 mg/dL (H)). Liver Function Tests: Recent Labs  Lab 05/19/21 2136  AST 46*  ALT 38  ALKPHOS 79  BILITOT 1.4*  PROT 7.2  ALBUMIN 4.1   Recent Labs  Lab 05/19/21 2136  LIPASE 23   No results for input(s): AMMONIA in the last 168 hours. Coagulation profile No results for input(s): INR, PROTIME in the last 168 hours.  CBC: Recent Labs  Lab 05/19/21 2136  WBC 7.9  HGB 9.1*  HCT 28.1*  MCV 95.6  PLT 207   Cardiac Enzymes: No results for input(s): CKTOTAL, CKMB, CKMBINDEX, TROPONINI in the last 168 hours. BNP (last 3 results) No results for input(s): PROBNP in the last 8760 hours. CBG: Recent Labs  Lab 05/22/21 1132 05/22/21 2157 05/22/21 2341 05/23/21 0333 05/23/21 0545  GLUCAP 190* 377* 411* 319* 279*   D-Dimer: No results for input(s): DDIMER in the last 72 hours. Hgb A1c: No results for input(s): HGBA1C in the last 72 hours. Lipid Profile: No results for input(s): CHOL, HDL, LDLCALC, TRIG, CHOLHDL, LDLDIRECT in the last 72 hours. Thyroid function studies: No results for input(s): TSH, T4TOTAL, T3FREE, THYROIDAB in the last 72 hours.  Invalid input(s): FREET3 Anemia work up: No results for input(s): VITAMINB12, FOLATE, FERRITIN, TIBC, IRON, RETICCTPCT in the last 72 hours. Sepsis Labs: Recent Labs  Lab 05/19/21 2136  WBC 7.9    Microbiology Recent Results  (from the past 240 hour(s))  Resp Panel by RT-PCR (Flu A&B, Covid) Nasopharyngeal Swab     Status: None   Collection Time: 05/20/21  7:56 AM   Specimen: Nasopharyngeal Swab; Nasopharyngeal(NP) swabs in vial transport medium  Result Value Ref Range Status   SARS Coronavirus 2 by RT PCR NEGATIVE NEGATIVE  Final    Comment: (NOTE) SARS-CoV-2 target nucleic acids are NOT DETECTED.  The SARS-CoV-2 RNA is generally detectable in upper respiratory specimens during the acute phase of infection. The lowest concentration of SARS-CoV-2 viral copies this assay can detect is 138 copies/mL. A negative result does not preclude SARS-Cov-2 infection and should not be used as the sole basis for treatment or other patient management decisions. A negative result may occur with  improper specimen collection/handling, submission of specimen other than nasopharyngeal swab, presence of viral mutation(s) within the areas targeted by this assay, and inadequate number of viral copies(<138 copies/mL). A negative result must be combined with clinical observations, patient history, and epidemiological information. The expected result is Negative.  Fact Sheet for Patients:  EntrepreneurPulse.com.au  Fact Sheet for Healthcare Providers:  IncredibleEmployment.be  This test is no t yet approved or cleared by the [place] FDA and  has been authorized for detection and/or diagnosis of SARS-CoV-2 by FDA under an Emergency Use Authorization (EUA). This EUA will remain  in effect (meaning this test can be used) for the duration of the COVID-19 declaration under Section 564(b)(1) of the Act, 21 U.S.C.section 360bbb-3(b)(1), unless the authorization is terminated  or revoked sooner.       Influenza A by PCR NEGATIVE NEGATIVE Final   Influenza B by PCR NEGATIVE NEGATIVE Final    Comment: (NOTE) The Xpert Xpress SARS-CoV-2/FLU/RSV plus assay is intended as an aid in the  diagnosis of influenza from Nasopharyngeal swab specimens and should not be used as a sole basis for treatment. Nasal washings and aspirates are unacceptable for Xpert Xpress SARS-CoV-2/FLU/RSV testing.  Fact Sheet for Patients: EntrepreneurPulse.com.au  Fact Sheet for Healthcare Providers: IncredibleEmployment.be  This test is not yet approved or cleared by the [place] FDA and has been authorized for detection and/or diagnosis of SARS-CoV-2 by FDA under an Emergency Use Authorization (EUA). This EUA will remain in effect (meaning this test can be used) for the duration of the COVID-19 declaration under Section 564(b)(1) of the Act, 21 U.S.C. section 360bbb-3(b)(1), unless the authorization is terminated or revoked.  Performed at [facility], [address] [place], [place] [address]     Procedures and diagnostic studies:  No results found.             LOS: 3 days   Jaliyah Fotheringham  [facility]   Pager on [email or web address]. If 7PM-7AM, please contact night-coverage at [email or web address]     05/23/2021, 12:18 PM

## 2021-05-24 DIAGNOSIS — N179 Acute kidney failure, unspecified: Secondary | ICD-10-CM | POA: Diagnosis not present

## 2021-05-24 DIAGNOSIS — E1010 Type 1 diabetes mellitus with ketoacidosis without coma: Secondary | ICD-10-CM | POA: Diagnosis not present

## 2021-05-24 DIAGNOSIS — E875 Hyperkalemia: Secondary | ICD-10-CM | POA: Diagnosis not present

## 2021-05-24 LAB — GLUCOSE, CAPILLARY
Glucose-Capillary: 217 mg/dL — ABNORMAL HIGH (ref 70–99)
Glucose-Capillary: 227 mg/dL — ABNORMAL HIGH (ref 70–99)
Glucose-Capillary: 250 mg/dL — ABNORMAL HIGH (ref 70–99)
Glucose-Capillary: 260 mg/dL — ABNORMAL HIGH (ref 70–99)
Glucose-Capillary: 299 mg/dL — ABNORMAL HIGH (ref 70–99)
Glucose-Capillary: 399 mg/dL — ABNORMAL HIGH (ref 70–99)

## 2021-05-24 LAB — BASIC METABOLIC PANEL
Anion gap: 8 (ref 5–15)
BUN: 33 mg/dL — ABNORMAL HIGH (ref 6–20)
CO2: 25 mmol/L (ref 22–32)
Calcium: 8.1 mg/dL — ABNORMAL LOW (ref 8.9–10.3)
Chloride: 102 mmol/L (ref 98–111)
Creatinine, Ser: 1.7 mg/dL — ABNORMAL HIGH (ref 0.44–1.00)
GFR, Estimated: 41 mL/min — ABNORMAL LOW (ref >60)
Glucose, Bld: 268 mg/dL — ABNORMAL HIGH (ref 70–99)
Potassium: 4.1 mmol/L (ref 3.5–5.1)
Sodium: 135 mmol/L (ref 135–145)

## 2021-05-24 MED ORDER — LOPERAMIDE HCL 2 MG PO CAPS: 2.0000 mg | ORAL_CAPSULE | ORAL | Status: DC | PRN

## 2021-05-24 MED ORDER — INSULIN ASPART 100 UNIT/ML IJ SOLN
0.0000 [IU] | Freq: Three times a day (TID) | INTRAMUSCULAR | Status: DC
  Administered 2021-05-24: 5 [IU] via SUBCUTANEOUS
  Administered 2021-05-24: 3 [IU] via SUBCUTANEOUS
  Administered 2021-05-25: 2 [IU] via SUBCUTANEOUS
  Administered 2021-05-25: 5 [IU] via SUBCUTANEOUS
  Filled 2021-05-24 (×4): qty 1

## 2021-05-24 NOTE — Progress Notes (Signed)
PROGRESS NOTE    Ashley Camacho  ESP:[id number] DOB: 1991/03/29 DOA: 05/20/2021 PCP: Pcp, No (Confirm with patient/family/NH records and if not entered, this HAS to be entered at [facility] point of entry. "No PCP" if truly none.)   Chief Complaint  Patient presents with   Vomiting    Brief Narrative: 31 year old lady with TYPE 1 dm admitted for DKA.    Assessment & Plan:   Principal Problem:   DKA, type 1 (HCC) Active Problems:   Acute kidney injury (HCC)   Hypoglycemia   Hyperkalemia   DKA: Resolved.  Pt had multiple hypoglycemic episodes for which the insulin pump had to be discontinued.  Currently she is on very sensitive SSI. We will monitor her for 24 hours and if cbg's are less than 250, will plan for discharge in am.    AKI:  Improving with IV fluids.   Adrenal insufficiency :  Resume home meds.     Hyperkalemia:  Resolved.     DVT prophylaxis:  Code Status: full code  Family Communication: none at bedside.   Disposition:   Status is: Inpatient  Remains inpatient appropriate because:        Consultants:  None.   Procedures: none.   Antimicrobials: none.    Subjective: No new complaints.   Objective: Vitals:   05/23/21 1932 05/24/21 0336 05/24/21 0842 05/24/21 1535  BP: 103/82 106/81 119/85 115/80  Pulse: 96 87 78 99  Resp: 16 16 16 18   Temp: 98.6 F (37 C) 98.5 F (36.9 C) 97.9 F (36.6 C) 97.7 F (36.5 C)  TempSrc:      SpO2: 100% 100% 96% 96%  Weight:      Height:       No intake or output data in the 24 hours ending 05/24/21 1659 Filed Weights   05/19/21 2130  Weight: 52.2 kg    Examination:  General exam: Appears calm and comfortable  Respiratory system: Clear to auscultation. Respiratory effort normal. Cardiovascular system: S1 & S2 heard, RRR. No JVD, No pedal edema. Gastrointestinal system: Abdomen is nondistended, soft and nontender.Normal bowel sounds heard. Central nervous system: Alert and oriented. No  focal neurological deficits. Extremities: Symmetric 5 x 5 power. Skin: No rashes, lesions or ulcers Psychiatry: \Mood & affect appropriate.     Data Reviewed: I have personally reviewed following labs and imaging studies  CBC: Recent Labs  Lab 05/19/21 2136  WBC 7.9  HGB 9.1*  HCT 28.1*  MCV 95.6  PLT 207    Basic Metabolic Panel: Recent Labs  Lab 05/20/21 0749 05/20/21 0951 05/21/21 0152 05/21/21 0845 05/22/21 0616 05/23/21 0525 05/24/21 0824  NA 140   < > 137 136 138 135 135  K 4.6   < > 3.9 4.3 4.6 5.3* 4.1  CL 103   < > 108 108 106 105 102  CO2 13*   < > 25 22 23 22 25   GLUCOSE 181*   < > 140* 194* 72 321* 268*  BUN 34*   < > 27* 25* 24* 32* 33*  CREATININE 1.55*   < > 1.52* 1.45* 1.65* 1.86* 1.70*  CALCIUM 8.6*   < > 8.1* 7.7* 8.1* 8.0* 8.1*  MG 2.2  --   --   --   --   --   --   PHOS  --   --  2.7  --   --   --   --    < > = values in  this interval not displayed.    GFR: Estimated Creatinine Clearance: 36.5 mL/min (A) (by C-G formula based on SCr of 1.7 mg/dL (H)).  Liver Function Tests: Recent Labs  Lab 05/19/21 2136  AST 46*  ALT 38  ALKPHOS 79  BILITOT 1.4*  PROT 7.2  ALBUMIN 4.1    CBG: Recent Labs  Lab 05/23/21 1759 05/24/21 0022 05/24/21 0521 05/24/21 0604 05/24/21 1230  GLUCAP 263* 260* 227* 250* 399*     Recent Results (from the past 240 hour(s))  Resp Panel by RT-PCR (Flu A&B, Covid) Nasopharyngeal Swab     Status: None   Collection Time: 05/20/21  7:56 AM   Specimen: Nasopharyngeal Swab; Nasopharyngeal(NP) swabs in vial transport medium  Result Value Ref Range Status   SARS Coronavirus 2 by RT PCR NEGATIVE NEGATIVE Final    Comment: (NOTE) SARS-CoV-2 target nucleic acids are NOT DETECTED.  The SARS-CoV-2 RNA is generally detectable in upper respiratory specimens during the acute phase of infection. The lowest concentration of SARS-CoV-2 viral copies this assay can detect is 138 copies/mL. A negative result does not  preclude SARS-Cov-2 infection and should not be used as the sole basis for treatment or other patient management decisions. A negative result may occur with  improper specimen collection/handling, submission of specimen other than nasopharyngeal swab, presence of viral mutation(s) within the areas targeted by this assay, and inadequate number of viral copies(<138 copies/mL). A negative result must be combined with clinical observations, patient history, and epidemiological information. The expected result is Negative.  Fact Sheet for Patients:  BloggerCourse.com  Fact Sheet for Healthcare Providers:  SeriousBroker.it  This test is no t yet approved or cleared by the [place] FDA and  has been authorized for detection and/or diagnosis of SARS-CoV-2 by FDA under an Emergency Use Authorization (EUA). This EUA will remain  in effect (meaning this test can be used) for the duration of the COVID-19 declaration under Section 564(b)(1) of the Act, 21 U.S.C.section 360bbb-3(b)(1), unless the authorization is terminated  or revoked sooner.       Influenza A by PCR NEGATIVE NEGATIVE Final   Influenza B by PCR NEGATIVE NEGATIVE Final    Comment: (NOTE) The Xpert Xpress SARS-CoV-2/FLU/RSV plus assay is intended as an aid in the diagnosis of influenza from Nasopharyngeal swab specimens and should not be used as a sole basis for treatment. Nasal washings and aspirates are unacceptable for Xpert Xpress SARS-CoV-2/FLU/RSV testing.  Fact Sheet for Patients: BloggerCourse.com  Fact Sheet for Healthcare Providers: SeriousBroker.it  This test is not yet approved or cleared by the [place] FDA and has been authorized for detection and/or diagnosis of SARS-CoV-2 by FDA under an Emergency Use Authorization (EUA). This EUA will remain in effect (meaning this test can be used) for the  duration of the COVID-19 declaration under Section 564(b)(1) of the Act, 21 U.S.C. section 360bbb-3(b)(1), unless the authorization is terminated or revoked.  Performed at [facility], [address] [place], [place] [address]          Radiology Studies: No results found.      Scheduled Meds:  enoxaparin (LOVENOX) injection  40 mg Subcutaneous Q24H   hydrocortisone  10 mg Oral BH-[id number]   hydrocortisone  5 mg Oral Q1500   insulin aspart  0-6 Units Subcutaneous TID WC   levothyroxine  25 mcg Oral Q0600   Continuous Infusions:  promethazine (PHENERGAN) injection (IM or IVPB) 12.5 mg (05/23/21 1103)     LOS: 4 days  Kathlen Mody, MD [facility]   To contact the attending provider between 7A-7P or the covering provider during after hours 7P-7A, please log into the web site [email or web address] and access using universal [place] password for that web site. If you do not have the password, please call the hospital operator.  05/24/2021, 4:59 PM

## 2021-05-24 NOTE — Evaluation (Signed)
Physical Therapy Evaluation Patient Details Name: Ashley Camacho MRN: [id number] DOB: 1991-05-06 Today's Date: 05/24/2021  History of Present Illness  Ashley Camacho is a 31 y.o. female with medical history significant for type I DM on insulin pump, adrenal insufficiency, peripheral neuropathy, depression, hypertension, diabetic gastroparesis, recent hypoglycemic episodes requiring intubation on 04/27/2021, recent hospitalization for community-acquired pneumonia from 05/05/2021 through 05/10/2021.  She presented to the hospital because of nausea, vomiting, diarrhea, poor oral intake and generalized weakness.    Clinical Impression  Pt admitted with above diagnosis. Pt received in bed agreeable to PT services. Reports being indep at baseline with all mobility, ADL's/IADL completion. To date pt is independent with bed mobility, transfers, ambulation, and asc/desc stairs with B rail use. Pt is initially unsteady on her feet with R/L drifting but pt able to correct independently and normalizes as she ambulates. She is slow and cautious but performs with reciprocal pattern. Post stair assessment pt endorses feelings that she is about to pass out in PT gym so pt assisted in laying in supine. All vitals WNL: HR 98BPM, SPO2 98%, BP 128/92 mm Hg. Diastolic elevated but anticipate due to multiple layers of clothes donned. Pt able to safely ambulate back to room without dizziness or similar sensation after laying down 2-3 minutes. Pt educated on benefits of gradual return to activity, energy conservation, and benefits of HH PT due to decline in strength/endurance with basic ambulatory tasks. Pt acknowledges but overall not engaging. Safe to d/c home, PT to recommend [facility] PT at d/c due to decline in strength/endurance with upright mobility. Pt currently with functional limitations due to the deficits listed below (see PT Problem List). Pt will benefit from skilled PT to increase their independence and safety with  mobility to allow discharge to the venue listed below.      Recommendations for follow up therapy are one component of a multi-disciplinary discharge planning process, led by the attending physician.  Recommendations may be updated based on patient status, additional functional criteria and insurance authorization.  Follow Up Recommendations Home health PT    Assistance Recommended at Discharge PRN  Functional Status Assessment Patient has had a recent decline in their functional status and demonstrates the ability to make significant improvements in function in a reasonable and predictable amount of time.  Equipment Recommendations  None recommended by PT    Recommendations for Other Services       Precautions / Restrictions Precautions Precautions: Fall Restrictions Weight Bearing Restrictions: No      Mobility  Bed Mobility Overal bed mobility: Modified Independent               Patient Response: Flat affect  Transfers Overall transfer level: Modified independent                      Ambulation/Gait Ambulation/Gait assistance: Supervision Gait Distance (Feet): 300 Feet Assistive device: None Gait Pattern/deviations: Step-through pattern;Drifts right/left Gait velocity: Slow and cautious     General Gait Details: INitially unsteady with minor drifting to R/L but able to correct without external support, reporting minor dizziness. IMproves and gait improves as pt ambulates.  Stairs Stairs: Yes Stairs assistance: Supervision Stair Management: Two rails;Alternating pattern;Forwards Number of Stairs: 4 General stair comments: Reports dizziness after asc/desc stairs requesting to sit then lay down.  Wheelchair Mobility    Modified Rankin (Stroke Patients Only)       Balance Overall balance assessment: Mild deficits observed, not formally tested  Pertinent Vitals/Pain Pain Assessment:  No/denies pain    Home Living Family/patient expects to be discharged to:: Private residence Living Arrangements: Spouse/significant other;Children Available Help at Discharge: Available 24 hours/day Type of Home: House Home Access: Stairs to enter Entrance Stairs-Rails: Left Entrance Stairs-Number of Steps: 3 Alternate Level Stairs-Number of Steps: flight Home Layout: Two level;Full bath on main level (reports going up/down flight of stairs on occasion, has been living on main level.) Home Equipment: None      Prior Function Prior Level of Function : Independent/Modified Independent                     Hand Dominance   Dominant Hand: Left    Extremity/Trunk Assessment   Upper Extremity Assessment Upper Extremity Assessment: Overall WFL for tasks assessed    Lower Extremity Assessment Lower Extremity Assessment: Generalized weakness    Cervical / Trunk Assessment Cervical / Trunk Assessment: Normal  Communication   Communication:  (pt very soft spoken)  Cognition Arousal/Alertness: Awake/alert Behavior During Therapy: WFL for tasks assessed/performed Overall Cognitive Status: Within Functional Limits for tasks assessed                                          General Comments General comments (skin integrity, edema, etc.): Initially unsteady with standing and ambulating. Able to don shoes in sitting with no LOB.    Exercises Other Exercises Other Exercises: Role of PT in acute setting, D/c recs, energy conservation techniques, benefits of HHPT to return to PLOF   Assessment/Plan    PT Assessment Patient needs continued PT services  PT Problem List Decreased strength;Decreased activity tolerance       PT Treatment Interventions Therapeutic exercise;Balance training;Stair training;Neuromuscular re-education;Therapeutic activities;Patient/family education    PT Goals (Current goals can be found in the Care Plan section)  Acute Rehab PT  Goals Patient Stated Goal: to go home PT Goal Formulation: With patient Time For Goal Achievement: 06/07/21 Potential to Achieve Goals: Good    Frequency Min 2X/week   Barriers to discharge        Co-evaluation               AM-PAC PT "6 Clicks" Mobility  Outcome Measure Help needed turning from your back to your side while in a flat bed without using bedrails?: None Help needed moving from lying on your back to sitting on the side of a flat bed without using bedrails?: None Help needed moving to and from a bed to a chair (including a wheelchair)?: None Help needed standing up from a chair using your arms (e.g., wheelchair or bedside chair)?: None Help needed to walk in hospital room?: None Help needed climbing 3-5 steps with a railing? : A Little 6 Click Score: 23    End of Session   Activity Tolerance: Patient tolerated treatment well Patient left: in bed Nurse Communication: Mobility status PT Visit Diagnosis: Unsteadiness on feet (R26.81);Muscle weakness (generalized) (M62.81)    Time: 9449-6759 PT Time Calculation (min) (ACUTE ONLY): 20 min   Charges:   PT Evaluation $PT Eval Low Complexity: 1 Low         Aithana Kushner M. Fairly IV, PT, DPT Physical Therapist- [place]  [facility]  05/24/2021, 3:06 PM

## 2021-05-24 NOTE — Progress Notes (Signed)
Inpatient Diabetes Program Recommendations  AACE/ADA: New Consensus Statement on Inpatient Glycemic Control (2015)  Target Ranges:  Prepandial:   less than 140 mg/dL      Peak postprandial:   less than 180 mg/dL (1-2 hours)      Critically ill patients:  140 - 180 mg/dL   Lab Results  Component Value Date   GLUCAP 250 (H) 05/24/2021   HGBA1C 6.6 (H) 04/27/2021     Diabetes history: DM1 (makes NO insulin; requires basal, correction, and carbohydrate coverage insulin) Outpatient Diabetes medications: T-slim insulin pump; per prior visit insulin pump settings should be: Basal 12A 0.2 units/hour 6A   0.3 units/hour Total Basal: 6.6 units per 24 hours Correction Factor 1:80 mg/dl (1 unit drops glucose 80 mg/dl) Carb Ratio 1:20 grams (1 unit covers 20 grams of carbs) Target glucose 110 mg/dl Current orders for Inpatient glycemic control: Novolog 0-4 units Q6H; Solucortef 10 mg Qd, 5 mg QP  Inpatient Diabetes Program Recommendations:    With steroids, consider slight increase to correction to Novolog 0-6 units Q6H. Secure chat sent to MD.   Thanks, Bronson Curb, MSN, RNC-OB Diabetes Coordinator [phone] (8a-5p)

## 2021-05-24 NOTE — Significant Event (Incomplete)
Rapid Response Event Note   Reason for Call :    Initial Focused Assessment:       Interventions:    Plan of Care:     Event Summary:   MD Notified:  Call Time: Arrival Time: End Time:  Tequisha Maahs A, RN

## 2021-05-25 DIAGNOSIS — E1010 Type 1 diabetes mellitus with ketoacidosis without coma: Secondary | ICD-10-CM | POA: Diagnosis not present

## 2021-05-25 DIAGNOSIS — E875 Hyperkalemia: Secondary | ICD-10-CM | POA: Diagnosis not present

## 2021-05-25 DIAGNOSIS — N179 Acute kidney failure, unspecified: Secondary | ICD-10-CM | POA: Diagnosis not present

## 2021-05-25 LAB — BASIC METABOLIC PANEL
Anion gap: 8 (ref 5–15)
BUN: 34 mg/dL — ABNORMAL HIGH (ref 6–20)
CO2: 24 mmol/L (ref 22–32)
Calcium: 8.2 mg/dL — ABNORMAL LOW (ref 8.9–10.3)
Chloride: 105 mmol/L (ref 98–111)
Creatinine, Ser: 1.59 mg/dL — ABNORMAL HIGH (ref 0.44–1.00)
GFR, Estimated: 45 mL/min — ABNORMAL LOW (ref >60)
Glucose, Bld: 320 mg/dL — ABNORMAL HIGH (ref 70–99)
Potassium: 4.8 mmol/L (ref 3.5–5.1)
Sodium: 137 mmol/L (ref 135–145)

## 2021-05-25 LAB — GLUCOSE, CAPILLARY
Glucose-Capillary: 375 mg/dL — ABNORMAL HIGH (ref 70–99)
Glucose-Capillary: 235 mg/dL — ABNORMAL HIGH (ref 70–99)

## 2021-05-25 MED ORDER — INSULIN ASPART 100 UNIT/ML IJ SOLN
2.0000 [IU] | Freq: Three times a day (TID) | INTRAMUSCULAR | Status: DC
  Administered 2021-05-25 (×2): 2 [IU] via SUBCUTANEOUS
  Filled 2021-05-25 (×2): qty 1

## 2021-05-25 MED ORDER — LEVOTHYROXINE SODIUM 25 MCG PO TABS
25.0000 ug | ORAL_TABLET | Freq: Every day | ORAL | 3 refills | Status: DC
  Filled 2021-05-25: qty 30, 30d supply, fill #0

## 2021-05-25 MED ORDER — INSULIN ASPART 100 UNIT/ML IJ SOLN: INTRAMUSCULAR | 11 refills | Status: DC

## 2021-05-25 NOTE — Discharge Summary (Signed)
Physician Discharge Summary  Ashley Camacho:[id number] DOB: 09-Mar-1991 DOA: 05/20/2021  PCP: Pcp, No  Admit date: 05/20/2021 Discharge date: 05/25/2021  Admitted From: Home Disposition:  Home  Recommendations for Outpatient Follow-up:  Follow up with PCP in 1-2 weeks Please obtain BMP/CBC in one week Please follow up with endocrinologist in one week.   Discharge Condition:stable. CODE STATUS: full code Diet recommendation: Heart Healthy /carb modified diet.  Brief/Interim Summary:  Ashley Camacho is a 31 y.o. female with medical history significant for type I DM on insulin pump, adrenal insufficiency, peripheral neuropathy, depression, hypertension, diabetic gastroparesis, recent hypoglycemic episodes requiring intubation on 04/27/2021, recent hospitalization for community-acquired pneumonia from 05/05/2021 through 05/10/2021.  She presented to the hospital because of nausea, vomiting, diarrhea, poor oral intake and generalized weakness.  Discharge Diagnoses:  Principal Problem:   DKA, type 1 (HCC) Active Problems:   Acute kidney injury (HCC)   Hypoglycemia   Hyperkalemia  Type I DKA, hyperglycemia: DKA has resolved.   Recurrent hypoglycemia: Use NovoLog as needed for hyperglycemia.  Patient will no longer continue with insulin pump at discharge.  She will be discharged on very low-dose NovoLog sliding scale.  She will follow-up with her endocrinologist, Dr. Amalia Hailey, for further recommendations.   Nausea and poor oral intake: resolved.   AKI on probable CKD stage IIIa, Hyperkalemia: resolved with IV fluids.    Prolonged QTc interval (601): Improved to 435 on EKG done on 05/22/2021.   Adrenal insufficiency: This was confirmed by ACTH test on January 16, 2021.  Continue hydrocortisone.   Hypothyroidism: Continue Synthroid  Depression:no suicidal ideations.       Discharge Instructions  Discharge Instructions     Diet - low sodium heart healthy   Complete by: As  directed    Discharge instructions   Complete by: As directed    PLEASE follow up with your endocrinologist in one week.      Allergies as of 05/25/2021   No Known Allergies      Medication List     STOP taking these medications    feeding supplement (GLUCERNA SHAKE) Liqd   gabapentin 100 MG capsule Commonly known as: NEURONTIN   HYDROcodone-acetaminophen 5-325 MG tablet Commonly known as: NORCO/VICODIN   ibuprofen 600 MG tablet Commonly known as: ADVIL   metoCLOPramide 10 MG tablet Commonly known as: REGLAN   mirtazapine 7.5 MG tablet Commonly known as: REMERON       TAKE these medications    Acetaminophen Extra Strength 500 MG tablet Generic drug: acetaminophen Take 2 tablets (1,000 mg total) by mouth every 6 (six) hours as needed for headache, fever or moderate pain.   hydrocortisone 5 MG tablet Commonly known as: CORTEF Take 1 tablet (5 mg total) by mouth once daily at 4PM. What changed: You were already taking a medication with the same name, and this prescription was added. Make sure you understand how and when to take each.   hydrocortisone 10 MG tablet Commonly known as: CORTEF Take 1 tablet (10 mg total) by mouth once every morning. What changed:  medication strength how much to take when to take this   levothyroxine 25 MCG tablet Commonly known as: SYNTHROID Take 1 tablet (25 mcg total) by mouth daily at 6 (six) AM.   multivitamin with minerals Tabs tablet Take 1 tablet by mouth daily.   NovoLOG FlexPen 100 UNIT/ML FlexPen Generic drug: insulin aspart Inject 2 Units into the skin 3 (three) times daily with meals Inject 0-6 Units  into the skin every 4 (four) hours. What changed: Another medication with the same name was added. Make sure you understand how and when to take each.   insulin aspart 100 UNIT/ML injection Commonly known as: novoLOG CBG 70 - 120: 0 units  CBG 121 - 150: 0 units  CBG 151 - 200: 1 unit  CBG 201-250: 2 units   CBG 251-300: 3 units  CBG 301-350: 4 units  CBG 351-400: 5 units What changed: You were already taking a medication with the same name, and this prescription was added. Make sure you understand how and when to take each.        Follow-up Information     Croitoru, Mihai, MD Follow up.   Specialty: Cardiology Why: Patient to call office for Appt. Contact information: [address] [place] [address] [phone]                No Known Allergies  Consultations: None.    Procedures/Studies: CT Head Wo Contrast  Result Date: 04/27/2021 CLINICAL DATA:  Decreased responsiveness.  Hypoglycemia. EXAM: CT HEAD WITHOUT CONTRAST TECHNIQUE: Contiguous axial images were obtained from the base of the skull through the vertex without intravenous contrast. COMPARISON:  None. FINDINGS: Brain: The brain shows a normal appearance without evidence of malformation, atrophy, old or acute small or large vessel infarction, mass lesion, hemorrhage, hydrocephalus or extra-axial collection. Vascular: No hyperdense vessel. No evidence of atherosclerotic calcification. Skull: Normal.  No traumatic finding.  No focal bone lesion. Sinuses/Orbits: Sinuses are clear. Orbits appear normal. Mastoids are clear. Other: None significant IMPRESSION: Normal head CT. Electronically Signed   By: Nelson Chimes M.D.   On: 04/27/2021 08:42   US Venous Img Upper Uni Right(DVT)  Result Date: 04/28/2021 CLINICAL DATA:  Right upper extremity pain for the past day. History of right upper extremity DVT. EXAM: RIGHT UPPER EXTREMITY VENOUS DOPPLER ULTRASOUND TECHNIQUE: Gray-scale sonography with graded compression, as well as color Doppler and duplex ultrasound were performed to evaluate the upper extremity deep venous system from the level of the subclavian vein and including the jugular, axillary, basilic, radial, ulnar and upper cephalic vein. Spectral Doppler was utilized to evaluate flow at rest and with  distal augmentation maneuvers. COMPARISON:  None. FINDINGS: Contralateral Subclavian Vein: Respiratory phasicity is normal and symmetric with the symptomatic side. No evidence of thrombus. Normal compressibility. Internal Jugular Vein: No evidence of thrombus. Normal compressibility, respiratory phasicity and response to augmentation. Subclavian Vein: No evidence of thrombus. Normal compressibility, respiratory phasicity and response to augmentation. Axillary Vein: No evidence of thrombus. Normal compressibility, respiratory phasicity and response to augmentation. Cephalic Vein: No evidence of thrombus. Normal compressibility, respiratory phasicity and response to augmentation. Basilic Vein: No evidence of thrombus. Normal compressibility, respiratory phasicity and response to augmentation. Brachial Veins: No evidence of thrombus within either of the paired brachial veins. Normal compressibility, respiratory phasicity and response to augmentation. Radial Veins: No evidence of thrombus. Normal compressibility, respiratory phasicity and response to augmentation. Ulnar Veins: No evidence of thrombus. Normal compressibility, respiratory phasicity and response to augmentation. Other Findings:  None visualized. IMPRESSION: No evidence of acute or chronic DVT within the right upper extremity. Electronically Signed   By: Sandi Mariscal M.D.   On: 04/28/2021 10:16   DG Chest Portable 1 View  Result Date: 05/05/2021 CLINICAL DATA:  AMS, hypoglycemia EXAM: PORTABLE CHEST 1 VIEW COMPARISON:  Radiograph 04/27/2021 FINDINGS: Unchanged cardiomediastinal silhouette. There is a small right midlung opacity new from prior exam.  No large pleural effusion. No visible pneumothorax. No acute osseous abnormality. IMPRESSION: Small right midlung opacity new from prior exam, could be developing infection. Electronically Signed   By: Maurine Simmering M.D.   On: 05/05/2021 12:36   DG Chest Portable 1 View  Result Date: 04/27/2021 CLINICAL  DATA:  Weakness EXAM: PORTABLE CHEST 1 VIEW COMPARISON:  02/21/2021 FINDINGS: Previously seen PICC line has been removed. The heart size and mediastinal contours are within normal limits. Both lungs are clear. The visualized skeletal structures are unremarkable. IMPRESSION: No active disease. Electronically Signed   By: Davina Poke D.O.   On: 04/27/2021 08:53   [place] EKG SITE RITE  Result Date: 05/07/2021 If Site Rite image not attached, placement could not be confirmed due to current cardiac rhythm.  [place] EKG SITE RITE  Result Date: 05/07/2021 If Site Rite image not attached, placement could not be confirmed due to current cardiac rhythm.     Subjective: No new complaints.   Discharge Exam: Vitals:   05/25/21 0505 05/25/21 0750  BP: 127/89 121/81  Pulse: 89 86  Resp: 20 16  Temp: 98.3 F (36.8 C) 97.8 F (36.6 C)  SpO2: 94% 97%   Vitals:   05/24/21 1535 05/24/21 1947 05/25/21 0505 05/25/21 0750  BP: 115/80 118/85 127/89 121/81  Pulse: 99 92 89 86  Resp: 18 18 20 16   Temp: 97.7 F (36.5 C) 98 F (36.7 C) 98.3 F (36.8 C) 97.8 F (36.6 C)  TempSrc:      SpO2: 96% 98% 94% 97%  Weight:      Height:        General: Pt is alert, awake, not in acute distress Cardiovascular: RRR, S1/S2 +, no rubs, no gallops Respiratory: CTA bilaterally, no wheezing, no rhonchi Abdominal: Soft, NT, ND, bowel sounds + Extremities: no edema, no cyanosis    The results of significant diagnostics from this hospitalization (including imaging, microbiology, ancillary and laboratory) are listed below for reference.     Microbiology: Recent Results (from the past 240 hour(s))  Resp Panel by RT-PCR (Flu A&B, Covid) Nasopharyngeal Swab     Status: None   Collection Time: 05/20/21  7:56 AM   Specimen: Nasopharyngeal Swab; Nasopharyngeal(NP) swabs in vial transport medium  Result Value Ref Range Status   SARS Coronavirus 2 by RT PCR NEGATIVE NEGATIVE Final    Comment: (NOTE) SARS-CoV-2  target nucleic acids are NOT DETECTED.  The SARS-CoV-2 RNA is generally detectable in upper respiratory specimens during the acute phase of infection. The lowest concentration of SARS-CoV-2 viral copies this assay can detect is 138 copies/mL. A negative result does not preclude SARS-Cov-2 infection and should not be used as the sole basis for treatment or other patient management decisions. A negative result may occur with  improper specimen collection/handling, submission of specimen other than nasopharyngeal swab, presence of viral mutation(s) within the areas targeted by this assay, and inadequate number of viral copies(<138 copies/mL). A negative result must be combined with clinical observations, patient history, and epidemiological information. The expected result is Negative.  Fact Sheet for Patients:  EntrepreneurPulse.com.au  Fact Sheet for Healthcare Providers:  IncredibleEmployment.be  This test is no t yet approved or cleared by the [place] FDA and  has been authorized for detection and/or diagnosis of SARS-CoV-2 by FDA under an Emergency Use Authorization (EUA). This EUA will remain  in effect (meaning this test can be used) for the duration of the COVID-19 declaration under Section 564(b)(1) of the Act, 21  U.S.C.section 360bbb-3(b)(1), unless the authorization is terminated  or revoked sooner.       Influenza A by PCR NEGATIVE NEGATIVE Final   Influenza B by PCR NEGATIVE NEGATIVE Final    Comment: (NOTE) The Xpert Xpress SARS-CoV-2/FLU/RSV plus assay is intended as an aid in the diagnosis of influenza from Nasopharyngeal swab specimens and should not be used as a sole basis for treatment. Nasal washings and aspirates are unacceptable for Xpert Xpress SARS-CoV-2/FLU/RSV testing.  Fact Sheet for Patients: EntrepreneurPulse.com.au  Fact Sheet for Healthcare  Providers: IncredibleEmployment.be  This test is not yet approved or cleared by the [place] FDA and has been authorized for detection and/or diagnosis of SARS-CoV-2 by FDA under an Emergency Use Authorization (EUA). This EUA will remain in effect (meaning this test can be used) for the duration of the COVID-19 declaration under Section 564(b)(1) of the Act, 21 U.S.C. section 360bbb-3(b)(1), unless the authorization is terminated or revoked.  Performed at [facility], [address] [place], [place] [address]      Labs: BNP (last 3 results) Recent Labs    02/23/21 0447 02/25/21 0421 02/28/21 0437  BNP 448.7* 1,024.1* [id number]   Basic Metabolic Panel: Recent Labs  Lab 05/20/21 0749 05/20/21 0951 05/21/21 0152 05/21/21 0845 05/22/21 0616 05/23/21 0525 05/24/21 0824 05/25/21 0525  NA 140   < > 137 136 138 135 135 137  K 4.6   < > 3.9 4.3 4.6 5.3* 4.1 4.8  CL 103   < > 108 108 106 105 102 105  CO2 13*   < > 25 22 23 22 25 24   GLUCOSE 181*   < > 140* 194* 72 321* 268* 320*  BUN 34*   < > 27* 25* 24* 32* 33* 34*  CREATININE 1.55*   < > 1.52* 1.45* 1.65* 1.86* 1.70* 1.59*  CALCIUM 8.6*   < > 8.1* 7.7* 8.1* 8.0* 8.1* 8.2*  MG 2.2  --   --   --   --   --   --   --   PHOS  --   --  2.7  --   --   --   --   --    < > = values in this interval not displayed.   Liver Function Tests: Recent Labs  Lab 05/19/21 2136  AST 46*  ALT 38  ALKPHOS 79  BILITOT 1.4*  PROT 7.2  ALBUMIN 4.1   Recent Labs  Lab 05/19/21 2136  LIPASE 23   No results for input(s): AMMONIA in the last 168 hours. CBC: Recent Labs  Lab 05/19/21 2136  WBC 7.9  HGB 9.1*  HCT 28.1*  MCV 95.6  PLT 207   Cardiac Enzymes: No results for input(s): CKTOTAL, CKMB, CKMBINDEX, TROPONINI in the last 168 hours. BNP: Invalid input(s): POCBNP CBG: Recent Labs  Lab 05/24/21 1230 05/24/21 1729 05/24/21 1944 05/25/21 0808 05/25/21 1146  GLUCAP 399* 299* 217* 375*  235*   D-Dimer No results for input(s): DDIMER in the last 72 hours. Hgb A1c No results for input(s): HGBA1C in the last 72 hours. Lipid Profile No results for input(s): CHOL, HDL, LDLCALC, TRIG, CHOLHDL, LDLDIRECT in the last 72 hours. Thyroid function studies No results for input(s): TSH, T4TOTAL, T3FREE, THYROIDAB in the last 72 hours.  Invalid input(s): FREET3 Anemia work up No results for input(s): VITAMINB12, FOLATE, FERRITIN, TIBC, IRON, RETICCTPCT in the last 72 hours. Urinalysis    Component Value Date/Time   COLORURINE YELLOW 05/20/2021 1053  APPEARANCEUR CLEAR 05/20/2021 1053   APPEARANCEUR Clear 09/18/2020 1153   LABSPEC >1.030 (H) 05/20/2021 1053   LABSPEC 1.026 03/01/2013 1844   PHURINE 6.0 05/20/2021 1053   GLUCOSEU NEGATIVE 05/20/2021 1053   GLUCOSEU >=500 03/01/2013 1844   HGBUR LARGE (A) 05/20/2021 1053   BILIRUBINUR SMALL (A) 05/20/2021 1053   BILIRUBINUR neg 10/23/2020 0953   BILIRUBINUR Negative 09/18/2020 1153   BILIRUBINUR Negative 03/01/2013 1844   KETONESUR >160 (A) 05/20/2021 1053   PROTEINUR >300 (A) 05/20/2021 1053   UROBILINOGEN 0.2 10/23/2020 0953   NITRITE NEGATIVE 05/20/2021 1053   LEUKOCYTESUR NEGATIVE 05/20/2021 1053   LEUKOCYTESUR Negative 03/01/2013 1844   Sepsis Labs Invalid input(s): PROCALCITONIN,  WBC,  LACTICIDVEN Microbiology Recent Results (from the past 240 hour(s))  Resp Panel by RT-PCR (Flu A&B, Covid) Nasopharyngeal Swab     Status: None   Collection Time: 05/20/21  7:56 AM   Specimen: Nasopharyngeal Swab; Nasopharyngeal(NP) swabs in vial transport medium  Result Value Ref Range Status   SARS Coronavirus 2 by RT PCR NEGATIVE NEGATIVE Final    Comment: (NOTE) SARS-CoV-2 target nucleic acids are NOT DETECTED.  The SARS-CoV-2 RNA is generally detectable in upper respiratory specimens during the acute phase of infection. The lowest concentration of SARS-CoV-2 viral copies this assay can detect is 138 copies/mL. A negative  result does not preclude SARS-Cov-2 infection and should not be used as the sole basis for treatment or other patient management decisions. A negative result may occur with  improper specimen collection/handling, submission of specimen other than nasopharyngeal swab, presence of viral mutation(s) within the areas targeted by this assay, and inadequate number of viral copies(<138 copies/mL). A negative result must be combined with clinical observations, patient history, and epidemiological information. The expected result is Negative.  Fact Sheet for Patients:  EntrepreneurPulse.com.au  Fact Sheet for Healthcare Providers:  IncredibleEmployment.be  This test is no t yet approved or cleared by the [place] FDA and  has been authorized for detection and/or diagnosis of SARS-CoV-2 by FDA under an Emergency Use Authorization (EUA). This EUA will remain  in effect (meaning this test can be used) for the duration of the COVID-19 declaration under Section 564(b)(1) of the Act, 21 U.S.C.section 360bbb-3(b)(1), unless the authorization is terminated  or revoked sooner.       Influenza A by PCR NEGATIVE NEGATIVE Final   Influenza B by PCR NEGATIVE NEGATIVE Final    Comment: (NOTE) The Xpert Xpress SARS-CoV-2/FLU/RSV plus assay is intended as an aid in the diagnosis of influenza from Nasopharyngeal swab specimens and should not be used as a sole basis for treatment. Nasal washings and aspirates are unacceptable for Xpert Xpress SARS-CoV-2/FLU/RSV testing.  Fact Sheet for Patients: EntrepreneurPulse.com.au  Fact Sheet for Healthcare Providers: IncredibleEmployment.be  This test is not yet approved or cleared by the [place] FDA and has been authorized for detection and/or diagnosis of SARS-CoV-2 by FDA under an Emergency Use Authorization (EUA). This EUA will remain in effect (meaning this test can be used)  for the duration of the COVID-19 declaration under Section 564(b)(1) of the Act, 21 U.S.C. section 360bbb-3(b)(1), unless the authorization is terminated or revoked.  Performed at [facility], [address], [place], [place] [address]      Time coordinating discharge: 38 minutes.  SIGNED:   Hosie Poisson, MD  [facility]

## 2021-05-25 NOTE — Plan of Care (Signed)

## 2021-05-25 NOTE — Plan of Care (Signed)
Patient discharged home per MD orders at this time.All discharge instructions, education and medications reviewed with the Pt at the bedside.patient expressed understanding and will comply with dc instructions.follow up appointments was also communicated to the patient.no verbal c/o or any ssx of distress.patient was discharged home with self-care per order.Pt was transported home by boyfriend in a privately owned vehicle.

## 2021-05-26 ENCOUNTER — Other Ambulatory Visit: Payer: Self-pay

## 2021-06-01 ENCOUNTER — Encounter: Payer: Self-pay | Admitting: Emergency Medicine

## 2021-06-01 ENCOUNTER — Other Ambulatory Visit: Payer: Self-pay

## 2021-06-01 ENCOUNTER — Inpatient Hospital Stay
Admission: EM | Admit: 2021-06-01 | Discharge: 2021-06-04 | DRG: 638 | Disposition: A | Discharge status: Home / Self Care | Payer: Medicaid Other | Attending: Internal Medicine | Admitting: Internal Medicine

## 2021-06-01 DIAGNOSIS — D649 Anemia, unspecified: Secondary | ICD-10-CM | POA: Diagnosis present

## 2021-06-01 DIAGNOSIS — E11649 Type 2 diabetes mellitus with hypoglycemia without coma: Secondary | ICD-10-CM | POA: Diagnosis not present

## 2021-06-01 DIAGNOSIS — R404 Transient alteration of awareness: Secondary | ICD-10-CM | POA: Diagnosis not present

## 2021-06-01 DIAGNOSIS — E2740 Unspecified adrenocortical insufficiency: Secondary | ICD-10-CM

## 2021-06-01 DIAGNOSIS — E1010 Type 1 diabetes mellitus with ketoacidosis without coma: Secondary | ICD-10-CM | POA: Diagnosis present

## 2021-06-01 DIAGNOSIS — Z609 Problem related to social environment, unspecified: Secondary | ICD-10-CM

## 2021-06-01 DIAGNOSIS — I10 Essential (primary) hypertension: Secondary | ICD-10-CM | POA: Diagnosis not present

## 2021-06-01 DIAGNOSIS — E1043 Type 1 diabetes mellitus with diabetic autonomic (poly)neuropathy: Secondary | ICD-10-CM | POA: Diagnosis present

## 2021-06-01 DIAGNOSIS — E1042 Type 1 diabetes mellitus with diabetic polyneuropathy: Secondary | ICD-10-CM | POA: Diagnosis present

## 2021-06-01 DIAGNOSIS — Z79899 Other long term (current) drug therapy: Secondary | ICD-10-CM

## 2021-06-01 DIAGNOSIS — Z79890 Hormone replacement therapy (postmenopausal): Secondary | ICD-10-CM

## 2021-06-01 DIAGNOSIS — Z9641 Presence of insulin pump (external) (internal): Secondary | ICD-10-CM | POA: Diagnosis present

## 2021-06-01 DIAGNOSIS — Z659 Problem related to unspecified psychosocial circumstances: Secondary | ICD-10-CM

## 2021-06-01 DIAGNOSIS — F339 Major depressive disorder, recurrent, unspecified: Secondary | ICD-10-CM | POA: Diagnosis present

## 2021-06-01 DIAGNOSIS — Z98891 History of uterine scar from previous surgery: Secondary | ICD-10-CM

## 2021-06-01 DIAGNOSIS — F419 Anxiety disorder, unspecified: Secondary | ICD-10-CM | POA: Diagnosis present

## 2021-06-01 DIAGNOSIS — E46 Unspecified protein-calorie malnutrition: Secondary | ICD-10-CM | POA: Diagnosis present

## 2021-06-01 DIAGNOSIS — Z20822 Contact with and (suspected) exposure to covid-19: Secondary | ICD-10-CM | POA: Diagnosis present

## 2021-06-01 DIAGNOSIS — E109 Type 1 diabetes mellitus without complications: Secondary | ICD-10-CM | POA: Diagnosis present

## 2021-06-01 DIAGNOSIS — Z833 Family history of diabetes mellitus: Secondary | ICD-10-CM

## 2021-06-01 DIAGNOSIS — E162 Hypoglycemia, unspecified: Secondary | ICD-10-CM | POA: Diagnosis not present

## 2021-06-01 DIAGNOSIS — K3184 Gastroparesis: Secondary | ICD-10-CM | POA: Diagnosis present

## 2021-06-01 DIAGNOSIS — E10319 Type 1 diabetes mellitus with unspecified diabetic retinopathy without macular edema: Secondary | ICD-10-CM | POA: Diagnosis present

## 2021-06-01 DIAGNOSIS — F32A Depression, unspecified: Secondary | ICD-10-CM | POA: Diagnosis present

## 2021-06-01 DIAGNOSIS — T50904A Poisoning by unspecified drugs, medicaments and biological substances, undetermined, initial encounter: Secondary | ICD-10-CM | POA: Diagnosis not present

## 2021-06-01 DIAGNOSIS — Z794 Long term (current) use of insulin: Secondary | ICD-10-CM

## 2021-06-01 DIAGNOSIS — Z86718 Personal history of other venous thrombosis and embolism: Secondary | ICD-10-CM

## 2021-06-01 DIAGNOSIS — E039 Hypothyroidism, unspecified: Secondary | ICD-10-CM | POA: Diagnosis present

## 2021-06-01 DIAGNOSIS — E10649 Type 1 diabetes mellitus with hypoglycemia without coma: Principal | ICD-10-CM | POA: Diagnosis present

## 2021-06-01 DIAGNOSIS — E161 Other hypoglycemia: Secondary | ICD-10-CM | POA: Diagnosis not present

## 2021-06-01 LAB — BASIC METABOLIC PANEL
Anion gap: 7 (ref 5–15)
BUN: 17 mg/dL (ref 6–20)
CO2: 27 mmol/L (ref 22–32)
Calcium: 8.1 mg/dL — ABNORMAL LOW (ref 8.9–10.3)
Chloride: 103 mmol/L (ref 98–111)
Creatinine, Ser: 1.3 mg/dL — ABNORMAL HIGH (ref 0.44–1.00)
GFR, Estimated: 57 mL/min — ABNORMAL LOW (ref >60)
Glucose, Bld: 69 mg/dL — ABNORMAL LOW (ref 70–99)
Potassium: 4 mmol/L (ref 3.5–5.1)
Sodium: 137 mmol/L (ref 135–145)

## 2021-06-01 LAB — MAGNESIUM: Magnesium: 2 mg/dL (ref 1.7–2.4)

## 2021-06-01 LAB — URINALYSIS, COMPLETE (UACMP) WITH MICROSCOPIC
Bilirubin Urine: NEGATIVE
Glucose, UA: NEGATIVE mg/dL
Ketones, ur: NEGATIVE mg/dL
Nitrite: NEGATIVE
Protein, ur: >=300 mg/dL — AB
Specific Gravity, Urine: 1.016 (ref 1.005–1.030)
pH: 5 (ref 5.0–8.0)

## 2021-06-01 LAB — CBC WITH DIFFERENTIAL/PLATELET
Abs Immature Granulocytes: 0.01 10*3/uL (ref 0.00–0.07)
Basophils Absolute: 0.1 10*3/uL (ref 0.0–0.1)
Basophils Relative: 1 %
Eosinophils Absolute: 0.1 10*3/uL (ref 0.0–0.5)
Eosinophils Relative: 2 %
HCT: 33.3 % — ABNORMAL LOW (ref 36.0–46.0)
Hemoglobin: 10.2 g/dL — ABNORMAL LOW (ref 12.0–15.0)
Immature Granulocytes: 0 %
Lymphocytes Relative: 27 %
Lymphs Abs: 1.5 10*3/uL (ref 0.7–4.0)
MCH: 30.4 pg (ref 26.0–34.0)
MCHC: 30.6 g/dL (ref 30.0–36.0)
MCV: 99.4 fL (ref 80.0–100.0)
Monocytes Absolute: 0.4 10*3/uL (ref 0.1–1.0)
Monocytes Relative: 7 %
Neutro Abs: 3.4 10*3/uL (ref 1.7–7.7)
Neutrophils Relative %: 63 %
Platelets: 208 10*3/uL (ref 150–400)
RBC: 3.35 MIL/uL — ABNORMAL LOW (ref 3.87–5.11)
RDW: 15.1 % (ref 11.5–15.5)
WBC: 5.5 10*3/uL (ref 4.0–10.5)
nRBC: 0 % (ref 0.0–0.2)

## 2021-06-01 LAB — PHOSPHORUS: Phosphorus: 3.6 mg/dL (ref 2.5–4.6)

## 2021-06-01 LAB — RESP PANEL BY RT-PCR (FLU A&B, COVID) ARPGX2
Influenza A by PCR: NEGATIVE
Influenza B by PCR: NEGATIVE
SARS Coronavirus 2 by RT PCR: NEGATIVE

## 2021-06-01 LAB — CBG MONITORING, ED
Glucose-Capillary: 62 mg/dL — ABNORMAL LOW (ref 70–99)
Glucose-Capillary: 80 mg/dL (ref 70–99)
Glucose-Capillary: 81 mg/dL (ref 70–99)
Glucose-Capillary: 80 mg/dL (ref 70–99)

## 2021-06-01 MED ORDER — ENOXAPARIN SODIUM 40 MG/0.4ML IJ SOSY
40.0000 mg | PREFILLED_SYRINGE | INTRAMUSCULAR | Status: DC
  Administered 2021-06-01 – 2021-06-03 (×3): 40 mg via SUBCUTANEOUS
  Filled 2021-06-01 (×3): qty 0.4

## 2021-06-01 MED ORDER — DEXTROSE 10 % IV SOLN: INTRAVENOUS | Status: DC

## 2021-06-01 MED ORDER — DEXTROSE-NACL 5-0.9 % IV SOLN: INTRAVENOUS | Status: AC

## 2021-06-01 MED ORDER — ACETAMINOPHEN 650 MG RE SUPP: 650.0000 mg | Freq: Four times a day (QID) | RECTAL | Status: AC | PRN

## 2021-06-01 MED ORDER — HYDROCORTISONE 5 MG PO TABS
5.0000 mg | ORAL_TABLET | Freq: Every day | ORAL | Status: DC
  Administered 2021-06-01: 5 mg via ORAL
  Filled 2021-06-01: qty 1

## 2021-06-01 MED ORDER — DEXTROSE-NACL 5-0.9 % IV SOLN: INTRAVENOUS | Status: DC

## 2021-06-01 MED ORDER — ONDANSETRON HCL 4 MG PO TABS: 4.0000 mg | ORAL_TABLET | Freq: Four times a day (QID) | ORAL | Status: DC | PRN

## 2021-06-01 MED ORDER — INSULIN ASPART 100 UNIT/ML IJ SOLN
0.0000 [IU] | Freq: Three times a day (TID) | INTRAMUSCULAR | Status: DC
  Administered 2021-06-02: 2 [IU] via SUBCUTANEOUS
  Filled 2021-06-01: qty 1

## 2021-06-01 MED ORDER — HYDROCORTISONE 10 MG PO TABS
10.0000 mg | ORAL_TABLET | ORAL | Status: DC
  Filled 2021-06-01 (×2): qty 1

## 2021-06-01 MED ORDER — ONDANSETRON HCL 4 MG/2ML IJ SOLN
4.0000 mg | Freq: Four times a day (QID) | INTRAMUSCULAR | Status: DC | PRN
  Administered 2021-06-01 – 2021-06-02 (×4): 4 mg via INTRAVENOUS
  Filled 2021-06-01 (×4): qty 2

## 2021-06-01 MED ORDER — INSULIN ASPART 100 UNIT/ML IJ SOLN: 0.0000 [IU] | Freq: Every day | INTRAMUSCULAR | Status: DC

## 2021-06-01 MED ORDER — LEVOTHYROXINE SODIUM 50 MCG PO TABS
25.0000 ug | ORAL_TABLET | Freq: Every day | ORAL | Status: DC
  Administered 2021-06-02 – 2021-06-04 (×3): 25 ug via ORAL
  Filled 2021-06-01 (×3): qty 1

## 2021-06-01 MED ORDER — ACETAMINOPHEN 500 MG PO TABS: 1000.0000 mg | ORAL_TABLET | Freq: Four times a day (QID) | ORAL | Status: AC | PRN

## 2021-06-01 NOTE — H&P (Addendum)
History and Physical   Ashley Camacho [id number] DOB: 1990-11-05 DOA: 06/01/2021  PCP: [facility]ists: Dr. Anders Simmonds, endocrinology Patient coming from: Home via EMS  I have personally briefly reviewed patient's old medical records in [place].  Chief Concern: Hyperglycemia  HPI: Ashley Camacho is a 31 y.o. female with medical history significant for type 1 insulin-dependent diabetes mellitus, G2P2, depression, anxiety, hypothyroid, adrenal insufficiency, type I polyneuropathy, fatigue associated with anemia, history of ketosis, who presents emergency department via EMS for chief concerns of hypoglycemia.  At bedside patient is able to tell her name, her age, she knows she is in the hospital.  She denies being in any pain and states that she is feeling better.  She endorses that for the last couple of days she has been feeling generalized fatigue and weakness.  She endorses nausea and no vomiting.  She denies any chest pain, shortness of breath, syncope, dysuria, hematuria.  She denies cough, fever, chills, syncope, swelling of her lower extremity.  She endorses that she does not eat due to not feeling well with the nausea.  She endorses that she has diarrhea, states that this has been ongoing chronic issue for many years.  Social history: She lives with her boyfriend and 2 kids.  She denies any history of tobacco, EtOH, recreational drug use.  She currently is a homemaker.  Vaccination history: Unknown  ROS: Constitutional: no weight change, no fever ENT/Mouth: no sore throat, no rhinorrhea Eyes: no eye pain, no vision changes Cardiovascular: no chest pain, no dyspnea,  no edema, no palpitations Respiratory: no cough, no sputum, no wheezing Gastrointestinal: + nausea, no vomiting, no diarrhea, no constipation Genitourinary: no urinary incontinence, no dysuria, no hematuria Musculoskeletal: no arthralgias, no myalgias Skin: no skin  lesions, no pruritus, Neuro: + weakness, no loss of consciousness, no syncope Psych: no anxiety, no depression, + decrease appetite Heme/Lymph: no bruising, no bleeding  ED Course: Discussed with emergency medicine provider, patient requiring hospitalization for chief concerns of hypoglycemia.  Vitals in the emergency department showed temperature of 97.5, respiration rate of 15, heart rate of 94, blood pressure 122/89, SPO2 of 97% on room air.  Serum sodium 137, potassium 4.0, chloride 103, bicarb 27, BUN of 17, serum creatinine of 1.30, nonfasting blood glucose 69, GFR is 57.  WBC 5.5, hemoglobin 10.2, platelets of 208.  In the emergency department patient was started on D5 in sodium chloride infusion and D10 infusion.  Per ED note, EMS arrived and patient CBG was in the 40s, patient received 1 mg of glucagon and 15 g of p.o. glucose.  Per ED nursing note, patient CBG increased to 58 after glucagon.  Patient was offered food several times by ED nurse and patient refused despite explanation that patient needs to eat in order for her blood glucose to come up.  Per EDP, patient was told by her endocrinologist to turn the pump back on however her pump ran out of insulin.  Patient gave herself 8 units of Lantus and 2 units of short acting in the morning despite not eating anything.  Assessment/Plan  Principal Problem:   Hypoglycemia Active Problems:   Social problem   Anxiety   Brittle diabetes (HCC)   Depressive disorder   Hypothyroidism   Malnutrition (HCC)   S/P cesarean section   DKA, type 1 ([place])   Adrenal insufficiency ([place])   # Severe hypoglycemia-presumed secondary to insulin use without p.o. intake - Continue D10 and  D5 gtt. - Continue CBG monitoring every 2 hours for 3 more occurrences - Insulin SSI with at bedtime coverage and CBG monitoring 3 times daily [facility] - Inpatient consult placed to diabetes coordinator ordered - Goal inpatient blood glucose level is 140-180 -  Extensive discussion at bedside regarding the importance of eating with insulin use and the risk of hypoglycemia including death - I explained to patient that if she does not maintain compliance with eating and insulin use, she could develop slow gut/gastroparesis which would decrease her GI's ability to digest food - Recommend that she be referred to outpatient diabetes education as well by her endocrinologist  # Anxiety # Depressive disorder - Currently not on any antidepression medications - Psychiatry has been consulted, Joesphine Bare responded stating that psychiatry service will see the patient  # Normocytic anemia- - Patient's hemoglobin range has been 7.5-10.1 for December 2022 - Patient is within appropriate range at 10.2 on admission - Recommend outpatient follow-up with hematologist  # Hypothyroid-resume levothyroxine 25 mcg every morning  # Adrenal insufficiency-resumed hydrocortisone 10 mg q. morning and hydrocortisone 5 mg daily  # Med reconciliation-pending, a.m. team to complete  Chart reviewed.   DVT prophylaxis: Enoxaparin 40 mg subcutaneous Code Status: Full code Diet: Diabetic/carb modified Family Communication: no Disposition Plan: Pending clinical course Consults called: Psychiatry; diabetes coordinator Admission status: Cardiac telemetry, observation, telemetry ordered for 36 hours  Past Medical History:  Diagnosis Date   Adrenal insufficiency ([place])    Anemia    Gastroparesis    Hypertension    Type 1 diabetes ([place])    Past Surgical History:  Procedure Laterality Date   BIOPSY  01/14/2021   Procedure: BIOPSY;  Surgeon: Thornton Park, MD;  Location: [facility];  Service: Gastroenterology;;   CESAREAN SECTION     CESAREAN SECTION WITH BILATERAL TUBAL LIGATION N/A 02/17/2021   Procedure: CESAREAN SECTION WITH BILATERAL TUBAL LIGATION;  Surgeon: Truett Mainland, DO;  Location: [facility];  Service: Obstetrics;  Laterality: N/A;    ESOPHAGOGASTRODUODENOSCOPY (EGD) WITH PROPOFOL N/A 01/14/2021   Procedure: ESOPHAGOGASTRODUODENOSCOPY (EGD) WITH PROPOFOL;  Surgeon: Thornton Park, MD;  Location: [facility];  Service: Gastroenterology;  Laterality: N/A;   MOUTH SURGERY     Social History:  reports that she has never smoked. She has never used smokeless tobacco. She reports that she does not drink alcohol and does not use drugs.  No Known Allergies Family History  Problem Relation Age of Onset   Diabetes type I Father    CAD Father    CAD Paternal 8    CAD Paternal Grandfather    Breast cancer Mother    Ovarian cancer Neg Hx    Family history: Family history reviewed and pertinent diabetes type 1 in father.  Prior to Admission medications   Medication Sig Start Date End Date Taking? Authorizing Provider  acetaminophen (TYLENOL) 500 MG tablet Take 2 tablets (1,000 mg total) by mouth every 6 (six) hours as needed for headache, fever or moderate pain. 03/04/21   Constant, Peggy, MD  hydrocortisone (CORTEF) 10 MG tablet Take 1 tablet (10 mg total) by mouth once every morning. 05/23/21   Jennye Boroughs, MD  hydrocortisone (CORTEF) 5 MG tablet Take 1 tablet (5 mg total) by mouth once daily at 4PM. 05/22/21   Jennye Boroughs, MD  insulin aspart (NOVOLOG) 100 UNIT/ML FlexPen Inject 2 Units into the skin 3 (three) times daily with meals Inject 0-6 Units into the skin every 4 (four) hours. 03/04/21  Constant, Peggy, MD  insulin aspart (NOVOLOG) 100 UNIT/ML injection CBG 70 - 120: 0 units  CBG 121 - 150: 0 units  CBG 151 - 200: 1 unit  CBG 201-250: 2 units  CBG 251-300: 3 units  CBG 301-350: 4 units  CBG 351-400: 5 units 05/25/21   Hosie Poisson, MD  levothyroxine (SYNTHROID) 25 MCG tablet Take 1 tablet (25 mcg total) by mouth once daily at 6 (six) AM. 05/25/21   Hosie Poisson, MD  Multiple Vitamin (MULTIVITAMIN WITH MINERALS) TABS tablet Take 1 tablet by mouth daily. Patient not taking: Reported on 05/20/2021  05/11/21   Ezekiel Slocumb, DO   Physical Exam: Vitals:   06/01/21 1420 06/01/21 1426 06/01/21 1426 06/01/21 1556  BP:  122/89  97/76  Pulse:  94  90  Resp:  15  14  Temp:   (!) 97.5 F (36.4 C)   TempSrc:   Oral   SpO2:  97%  100%  Weight: 52.2 kg     Height: 5\' 1"  (1.549 m)      Constitutional: appears age-appropriate, frail, NAD, calm, comfortable Eyes: PERRL, lids and conjunctivae normal ENMT: Mucous membranes are moist. Posterior pharynx clear of any exudate or lesions. Age-appropriate dentition. Hearing appropriate Neck: normal, supple, no masses, no thyromegaly Respiratory: clear to auscultation bilaterally, no wheezing, no crackles. Normal respiratory effort. No accessory muscle use.  Cardiovascular: Regular rate and rhythm, no murmurs / rubs / gallops. No extremity edema. 2+ pedal pulses. No carotid bruits.  Abdomen: no tenderness, no masses palpated, no hepatosplenomegaly. Bowel sounds positive.  Musculoskeletal: no clubbing / cyanosis. No joint deformity upper and lower extremities. Good ROM, no contractures, no atrophy. Normal muscle tone.  Skin: no rashes, lesions, ulcers. No induration Neurologic: Sensation intact. Strength 5/5 in all 4.  Psychiatric: Normal judgment and insight. Alert and oriented x 3. Normal mood.   EKG: independently reviewed, showing sinus rhythm with rate of 88, QTc 418  Chest x-ray on Admission: Not indicated  Labs on Admission: I have personally reviewed following labs  CBC: Recent Labs  Lab 06/01/21 1450  WBC 5.5  NEUTROABS 3.4  HGB 10.2*  HCT 33.3*  MCV 99.4  PLT [id number]   Basic Metabolic Panel: Recent Labs  Lab 06/01/21 1450  NA 137  K 4.0  CL 103  CO2 27  GLUCOSE 69*  BUN 17  CREATININE 1.30*  CALCIUM 8.1*  MG 2.0  PHOS 3.6   GFR: Estimated Creatinine Clearance: 47.7 mL/min (A) (by C-G formula based on SCr of 1.3 mg/dL (H)).  CBG: Recent Labs  Lab 06/01/21 1418 06/01/21 1533  GLUCAP 62* 80   Urine analysis:     Component Value Date/Time   COLORURINE YELLOW 05/20/2021 1053   APPEARANCEUR CLEAR 05/20/2021 1053   APPEARANCEUR Clear 09/18/2020 1153   LABSPEC >1.030 (H) 05/20/2021 1053   LABSPEC 1.026 03/01/2013 1844   PHURINE 6.0 05/20/2021 1053   GLUCOSEU NEGATIVE 05/20/2021 1053   GLUCOSEU >=500 03/01/2013 1844   HGBUR LARGE (A) 05/20/2021 1053   BILIRUBINUR SMALL (A) 05/20/2021 1053   BILIRUBINUR neg 10/23/2020 0953   BILIRUBINUR Negative 09/18/2020 1153   BILIRUBINUR Negative 03/01/2013 1844   KETONESUR >160 (A) 05/20/2021 1053   PROTEINUR >300 (A) 05/20/2021 1053   UROBILINOGEN 0.2 10/23/2020 0953   NITRITE NEGATIVE 05/20/2021 1053   LEUKOCYTESUR NEGATIVE 05/20/2021 1053   LEUKOCYTESUR Negative 03/01/2013 1844   Dr. Tobie Poet [facility]  If 7PM-7AM, please contact overnight-coverage provider If 7AM-7PM, please contact day coverage  provider [email or web address]  06/01/2021, 5:55 PM

## 2021-06-01 NOTE — Consult Note (Signed)
[facility] Face-to-Face Psychiatry Consult   Reason for Consult: Hypoglycemia Referring Physician: Dr. Sidney Ace Patient Identification: Ashley Camacho MRN:  [id number] Principal Diagnosis: Hypoglycemia Diagnosis:  Principal Problem:   Hypoglycemia Active Problems:   Social problem   Anxiety   Brittle diabetes (HCC)   Depressive disorder   Hypothyroidism   Malnutrition (HCC)   S/P cesarean section   DKA, type 1 (HCC)   Adrenal insufficiency (HCC)   Total Time spent with patient: 1 hour  Subjective: "I don't need to see anybody." Ashley Camacho is a 31 y.o. female patient presented to [facility] ED via EMS from home due to CBG of 40. There were concerns about the patient due to her diagnosis of anxiety and depression, but she is currently not on any antidepressants or anti-anxieties. The patient said she does not want to be on antidepressants or anti-anxiety medications. She shared that it is her choice not to be followed by psychiatry. She states she has two children, a two-year-old and a three-month-old. She notes she lives with her boyfriend, the father of her two kids. The patient shared that she has great family support and does not need to speak with a therapist. The patient states she is doing well mentally. The patient was provided with resources for outpatient services should she need them. The patient discussed not suffering from postpartum depression and has lots of support with her children. The patient was seen face-to-face by this provider; the chart was reviewed and consulted with Dr. Erma Heritage on 06/01/2021 due to the patient's care. It was discussed with the EDP that the patient does not meet the criteria to be admitted to the psychiatric inpatient unit.  On evaluation, the patient is alert and oriented x 4, calm, flat but cooperative, and mood-congruent with affect.  The patient does not appear to be responding to internal or external stimuli. Neither is the patient presenting with any  delusional thinking. The patient denies auditory or visual hallucinations. The patient denies any suicidal, homicidal, or self-harm ideations. The patient is not presenting with any psychotic or paranoid behaviors.   Disposition-Patient psychiatrically cleared  HPI: Per Dr. Sidney Ace, Ashley Camacho is a 31 y.o. female with past medical history of type 1 diabetes, adrenal insufficiency on hydrocortisone, diabetic gastroparesis who presents with hypoglycemia.  Patient tells me that when she woke up this morning she gave herself 8 units of Lantus and 2 units of NovoLog.  She did not eat breakfast but just had some glucose tabs.  Then was not feeling well and checked her blood sugar was in the 20s.  When EMS arrived her blood sugar was in the 40s.  They were not able to establish IV access and she got glucagon and some glucose tabs and blood sugar improved to the 60s.  Patient tells me she has not been able to eat.  She is feels full and is not hungry and is having significant nausea and vomiting.  She is 3 months postpartum, denies suicidal ideation.   Review of patient's recent admission when she was discharged on 05/25/2021, patient came in with DKA and had frequent episodes of hypoglycemia.  She was ultimately taken off of her insulin pump and was advised to take a low-dose sliding scale of NovoLog.  Patient tells me she was doing this but then spoke with her outpatient endocrinologist who recommended that she resume the insulin pump.   Past Psychiatric History: History reviewed. No pertinent past psychiatric history  Risk to Self:  Risk to Others:   Prior Inpatient Therapy:   Prior Outpatient Therapy:    Past Medical History:  Past Medical History:  Diagnosis Date   Adrenal insufficiency (HCC)    Anemia    Gastroparesis    Hypertension    Type 1 diabetes (HCC)     Past Surgical History:  Procedure Laterality Date   BIOPSY  01/14/2021   Procedure: BIOPSY;  Surgeon: Tressia Danas, MD;   Location: [facility] ENDOSCOPY;  Service: Gastroenterology;;   CESAREAN SECTION     CESAREAN SECTION WITH BILATERAL TUBAL LIGATION N/A 02/17/2021   Procedure: CESAREAN SECTION WITH BILATERAL TUBAL LIGATION;  Surgeon: [name], DO;  Location: [facility] LD ORS;  Service: Obstetrics;  Laterality: N/A;   ESOPHAGOGASTRODUODENOSCOPY (EGD) WITH PROPOFOL N/A 01/14/2021   Procedure: ESOPHAGOGASTRODUODENOSCOPY (EGD) WITH PROPOFOL;  Surgeon: Tressia Danas, MD;  Location: [facility] ENDOSCOPY;  Service: Gastroenterology;  Laterality: N/A;   MOUTH SURGERY     Family History:  Family History  Problem Relation Age of Onset   Diabetes type I Father    CAD Father    CAD Paternal Grandmother    CAD Paternal Grandfather    Breast cancer Mother    Ovarian cancer Neg Hx    Family Psychiatric  History:  Social History:  Social History   Substance and Sexual Activity  Alcohol Use No     Social History   Substance and Sexual Activity  Drug Use No    Social History   Socioeconomic History   Marital status: Single    Spouse name: Not on file   Number of children: Not on file   Years of education: Not on file   Highest education level: Not on file  Occupational History   Occupation: home maker  Tobacco Use   Smoking status: Never   Smokeless tobacco: Never  Vaping Use   Vaping Use: Never used  Substance and Sexual Activity   Alcohol use: No   Drug use: No   Sexual activity: Yes    Birth control/protection: None  Other Topics Concern   Not on file  Social History Narrative   Not on file   Social Determinants of Health   Financial Resource Strain: Not on file  Food Insecurity: Not on file  Transportation Needs: Not on file  Physical Activity: Not on file  Stress: Not on file  Social Connections: Not on file   Additional Social History:    Allergies:  No Known Allergies  Labs:  Results for orders placed or performed during the hospital encounter of 06/01/21 (from the past 48 hour(s))  CBG  monitoring, ED     Status: Abnormal   Collection Time: 06/01/21  2:18 PM  Result Value Ref Range   Glucose-Capillary 62 (L) 70 - 99 mg/dL    Comment: Glucose reference range applies only to samples taken after fasting for at least 8 hours.  CBC with Differential/Platelet     Status: Abnormal   Collection Time: 06/01/21  2:50 PM  Result Value Ref Range   WBC 5.5 4.0 - 10.5 K/uL   RBC 3.35 (L) 3.87 - 5.11 MIL/uL   Hemoglobin 10.2 (L) 12.0 - 15.0 g/dL   HCT 40.9 (L) 81.1 - 91.4 %   MCV 99.4 80.0 - 100.0 fL   MCH 30.4 26.0 - 34.0 pg   MCHC 30.6 30.0 - 36.0 g/dL   RDW 78.2 95.6 - 21.3 %   Platelets 208 150 - 400 K/uL   nRBC 0.0 0.0 -  0.2 %   Neutrophils Relative % 63 %   Neutro Abs 3.4 1.7 - 7.7 K/uL   Lymphocytes Relative 27 %   Lymphs Abs 1.5 0.7 - 4.0 K/uL   Monocytes Relative 7 %   Monocytes Absolute 0.4 0.1 - 1.0 K/uL   Eosinophils Relative 2 %   Eosinophils Absolute 0.1 0.0 - 0.5 K/uL   Basophils Relative 1 %   Basophils Absolute 0.1 0.0 - 0.1 K/uL   Immature Granulocytes 0 %   Abs Immature Granulocytes 0.01 0.00 - 0.07 K/uL    Comment: Performed at [facility], [address] [place], [place] [address]  Basic metabolic panel     Status: Abnormal   Collection Time: 06/01/21  2:50 PM  Result Value Ref Range   Sodium 137 135 - 145 mmol/L   Potassium 4.0 3.5 - 5.1 mmol/L   Chloride 103 98 - 111 mmol/L   CO2 27 22 - 32 mmol/L   Glucose, Bld 69 (L) 70 - 99 mg/dL    Comment: Glucose reference range applies only to samples taken after fasting for at least 8 hours.   BUN 17 6 - 20 mg/dL   Creatinine, Ser 7.62 (H) 0.44 - 1.00 mg/dL   Calcium 8.1 (L) 8.9 - 10.3 mg/dL   GFR, Estimated 57 (L) >60 mL/min    Comment: (NOTE) Calculated using the CKD-EPI Creatinine Equation (2021)    Anion gap 7 5 - 15    Comment: Performed at [facility], [address] [place], [place] [address]  Magnesium     Status: None   Collection Time: 06/01/21  2:50 PM  Result  Value Ref Range   Magnesium 2.0 1.7 - 2.4 mg/dL    Comment: Performed at [facility], [address] [place], [place] [address]  Phosphorus     Status: None   Collection Time: 06/01/21  2:50 PM  Result Value Ref Range   Phosphorus 3.6 2.5 - 4.6 mg/dL    Comment: Performed at [facility], [address] [place], [place] [address]  CBG monitoring, ED     Status: None   Collection Time: 06/01/21  3:33 PM  Result Value Ref Range   Glucose-Capillary 80 70 - 99 mg/dL    Comment: Glucose reference range applies only to samples taken after fasting for at least 8 hours.  Resp Panel by RT-PCR (Flu A&B, Covid) Nasopharyngeal Swab     Status: None   Collection Time: 06/01/21  4:25 PM   Specimen: Nasopharyngeal Swab; Nasopharyngeal(NP) swabs in vial transport medium  Result Value Ref Range   SARS Coronavirus 2 by RT PCR NEGATIVE NEGATIVE    Comment: (NOTE) SARS-CoV-2 target nucleic acids are NOT DETECTED.  The SARS-CoV-2 RNA is generally detectable in upper respiratory specimens during the acute phase of infection. The lowest concentration of SARS-CoV-2 viral copies this assay can detect is 138 copies/mL. A negative result does not preclude SARS-Cov-2 infection and should not be used as the sole basis for treatment or other patient management decisions. A negative result may occur with  improper specimen collection/handling, submission of specimen other than nasopharyngeal swab, presence of viral mutation(s) within the areas targeted by this assay, and inadequate number of viral copies(<138 copies/mL). A negative result must be combined with clinical observations, patient history, and epidemiological information. The expected result is Negative.  Fact Sheet for Patients:  BloggerCourse.com  Fact Sheet for Healthcare Providers:  SeriousBroker.it  This test is no t yet  approved or cleared by the [place] and   has been authorized for detection and/or diagnosis of SARS-CoV-2 by FDA under an Emergency Use Authorization (EUA). This EUA will remain  in effect (meaning this test can be used) for the duration of the COVID-19 declaration under Section 564(b)(1) of the Act, 21 U.S.C.section 360bbb-3(b)(1), unless the authorization is terminated  or revoked sooner.       Influenza A by PCR NEGATIVE NEGATIVE   Influenza B by PCR NEGATIVE NEGATIVE    Comment: (NOTE) The Xpert Xpress SARS-CoV-2/FLU/RSV plus assay is intended as an aid in the diagnosis of influenza from Nasopharyngeal swab specimens and should not be used as a sole basis for treatment. Nasal washings and aspirates are unacceptable for Xpert Xpress SARS-CoV-2/FLU/RSV testing.  Fact Sheet for Patients: BloggerCourse.com  Fact Sheet for Healthcare Providers: SeriousBroker.it  This test is not yet approved or cleared by the [place] FDA and has been authorized for detection and/or diagnosis of SARS-CoV-2 by FDA under an Emergency Use Authorization (EUA). This EUA will remain in effect (meaning this test can be used) for the duration of the COVID-19 declaration under Section 564(b)(1) of the Act, 21 U.S.C. section 360bbb-3(b)(1), unless the authorization is terminated or revoked.  Performed at [facility], [address] [place], [place] [address]   CBG monitoring, ED     Status: None   Collection Time: 06/01/21  8:17 PM  Result Value Ref Range   Glucose-Capillary 81 70 - 99 mg/dL    Comment: Glucose reference range applies only to samples taken after fasting for at least 8 hours.    Current Facility-Administered Medications  Medication Dose Route Frequency Provider Last Rate Last Admin   acetaminophen (TYLENOL) tablet 1,000 mg  1,000 mg Oral Q6H PRN Cox, Amy N, DO       Or   acetaminophen (TYLENOL) suppository 650 mg  650 mg Rectal Q6H PRN Cox, Amy N, DO        dextrose 10 % infusion   Intravenous Continuous Cox, Amy N, DO 100 mL/hr at 06/01/21 2058 New Bag at 06/01/21 2058   dextrose 5 %-0.9 % sodium chloride infusion   Intravenous Continuous Cox, Amy N, DO 100 mL/hr at 06/01/21 1623 New Bag at 06/01/21 1623   enoxaparin (LOVENOX) injection 40 mg  40 mg Subcutaneous Q24H Cox, Amy N, DO       [START ON 06/02/2021] hydrocortisone (CORTEF) tablet 10 mg  10 mg Oral BH-q7a Cox, Amy N, DO       hydrocortisone (CORTEF) tablet 5 mg  5 mg Oral q1600 Cox, Amy N, DO   5 mg at 06/01/21 1821   insulin aspart (novoLOG) injection 0-5 Units  0-5 Units Subcutaneous QHS Cox, Amy N, DO       insulin aspart (novoLOG) injection 0-6 Units  0-6 Units Subcutaneous TID WC Cox, Amy N, DO       [START ON 06/02/2021] levothyroxine (SYNTHROID) tablet 25 mcg  25 mcg Oral Q0600 Cox, Amy N, DO       ondansetron (ZOFRAN) tablet 4 mg  4 mg Oral Q6H PRN Cox, Amy N, DO       Or   ondansetron (ZOFRAN) injection 4 mg  4 mg Intravenous Q6H PRN Cox, Amy N, DO   4 mg at 06/01/21 5462   Current Outpatient Medications  Medication Sig Dispense Refill   acetaminophen (TYLENOL) 500 MG tablet Take 2 tablets (1,000 mg total) by mouth every 6 (six) hours  as needed for headache, fever or moderate pain. 30 tablet 3   hydrocortisone (CORTEF) 10 MG tablet Take 1 tablet (10 mg total) by mouth once every morning. 30 tablet 0   hydrocortisone (CORTEF) 5 MG tablet Take 1 tablet (5 mg total) by mouth once daily at 4PM. 30 tablet 0   insulin aspart (NOVOLOG) 100 UNIT/ML FlexPen Inject 2 Units into the skin 3 (three) times daily with meals Inject 0-6 Units into the skin every 4 (four) hours. 15 mL 11   insulin aspart (NOVOLOG) 100 UNIT/ML injection CBG 70 - 120: 0 units  CBG 121 - 150: 0 units  CBG 151 - 200: 1 unit  CBG 201-250: 2 units  CBG 251-300: 3 units  CBG 301-350: 4 units  CBG 351-400: 5 units 10 mL 11   levothyroxine (SYNTHROID) 25 MCG tablet Take 1 tablet (25 mcg total) by mouth once daily at 6  (six) AM. 30 tablet 3   Multiple Vitamin (MULTIVITAMIN WITH MINERALS) TABS tablet Take 1 tablet by mouth daily. (Patient not taking: Reported on 05/20/2021)      Musculoskeletal: Strength & Muscle Tone: within normal limits Gait & Station: normal Patient leans: N/A  Psychiatric Specialty Exam:  Presentation  General Appearance: Appropriate for Environment  Eye Contact:Good  Speech:Clear and Coherent; Slow  Speech Volume:Decreased  Handedness:Right   Mood and Affect  Mood:Euthymic  Affect:Flat   Thought Process  Thought Processes:Coherent  Descriptions of Associations:Intact  Orientation:Full (Time, Place and Person)  Thought Content:Logical  History of Schizophrenia/Schizoaffective disorder:No data recorded Duration of Psychotic Symptoms:No data recorded Hallucinations:Hallucinations: None  Ideas of Reference:None  Suicidal Thoughts:Suicidal Thoughts: No  Homicidal Thoughts:Homicidal Thoughts: No   Sensorium  Memory:Immediate Good; Recent Good; Remote Good  Judgment:Good  Insight:Good   Executive Functions  Concentration:Good  Attention Span:Good  Recall:Good  Fund of Knowledge:Good  Language:Good   Psychomotor Activity  Psychomotor Activity:Psychomotor Activity: Normal   Assets  Assets:Communication Skills; Desire for Improvement; Resilience; Social Support; Physical Health   Sleep  Sleep:Sleep: Fair Number of Hours of Sleep: 6   Physical Exam: Physical Exam Vitals and nursing note reviewed.  Constitutional:      Appearance: Normal appearance. She is normal weight. She is ill-appearing.  HENT:     Head: Normocephalic and atraumatic.     Right Ear: External ear normal.     Left Ear: External ear normal.     Nose: Nose normal.     Mouth/Throat:     Mouth: Mucous membranes are moist.  Cardiovascular:     Rate and Rhythm: Normal rate.     Pulses: Normal pulses.  Pulmonary:     Effort: Pulmonary effort is normal.   Musculoskeletal:        General: Normal range of motion.     Cervical back: Normal range of motion and neck supple.  Neurological:     General: No focal deficit present.     Mental Status: She is alert and oriented to person, place, and time. Mental status is at baseline.  Psychiatric:        Attention and Perception: Attention and perception normal.        Mood and Affect: Mood normal. Affect is flat.        Speech: Speech normal.        Behavior: Behavior normal. Behavior is cooperative.        Thought Content: Thought content normal.        Cognition and Memory: Cognition and memory normal.  Judgment: Judgment normal.   Review of Systems  All other systems reviewed and are negative. Blood pressure 111/84, pulse 84, temperature (!) 97.5 F (36.4 C), temperature source Oral, resp. rate 16, height  (1.549 m), weight 52.2 kg, last menstrual period 05/19/2021, SpO2 100 %, unknown if currently breastfeeding. Body mass index is 21.73 kg/m.  Treatment Plan Summary: Plan -Patient psychiatrically cleared  with resources given for outpatient services.   Disposition: No evidence of imminent risk to self or others at present.   Patient does not meet criteria for psychiatric inpatient admission. Supportive therapy provided about ongoing stressors.  Gillermo Murdoch, NP 06/01/2021 9:37 PM

## 2021-06-01 NOTE — ED Notes (Signed)
This RN and Dr. Sidney Ace at bedside, pt here for hypoglycemia, pt is a type I diabetic. Pt states she turned off her pump last night and did not turn it back on and she has not been wanting to eat. Offered pt food, pt refused and states she does not want anything and explained we needed to do this for her CBG to come back up. Pt continuously refused. Pt has a flat affect and is very soft spoken. Asked if pt had any thoughts of harming herself, she said no.

## 2021-06-01 NOTE — ED Notes (Signed)
Meds given  pt eating dinner   nausea meds given also.  Pt alert skin warm and dry  iv fluids infusing.

## 2021-06-01 NOTE — ED Triage Notes (Signed)
Pt via EMS from home. Pt initial CBG of 40. EMS gave 1mg  of Glucagon and 15g of oral glucose. Pt denies any pain. CBG increased to 58 after the glucagon. Pt is type I diabetic and states her insulin pump has been off since yesterday. Pt is A&Ox4 and NAD  Dr. at bedside.

## 2021-06-01 NOTE — ED Provider Notes (Signed)
[facility] Provider Note    Event Date/Time   First MD Initiated Contact with Patient 06/01/21 1417     (approximate)   History   Hypoglycemia   HPI  Ashley Camacho is a 31 y.o. female with past medical history of type 1 diabetes, adrenal insufficiency on hydrocortisone, diabetic gastroparesis who presents with hypoglycemia.  Patient tells me that when she woke up this morning she gave herself 8 units of Lantus and 2 units of NovoLog.  She did not eat breakfast but just had some glucose tabs.  Then was not feeling well and checked her blood sugar was in the 20s.  When EMS arrived her blood sugar was in the 40s.  They were not able to establish IV access and she got glucagon and some glucose tabs and blood sugar improved to the 60s.  Patient tells me she has not been able to eat.  She is feels full and is not hungry and is having significant nausea and vomiting.  She is 3 months postpartum, denies suicidal ideation.  Review of patient's recent admission when she was discharged on 05/25/2021, patient came in with DKA and had frequent episodes of hypoglycemia.  She was ultimately taken off of her insulin pump and was advised to take a low-dose sliding scale of NovoLog.  Patient tells me she was doing this but then spoke with her outpatient endocrinologist who recommended that she resume the insulin pump.     Past Medical History:  Diagnosis Date   Adrenal insufficiency (HCC)    Anemia    Gastroparesis    Hypertension    Type 1 diabetes [facility])     Patient Active Problem List   Diagnosis Date Noted   DKA, type 1 (HCC) 05/20/2021   Moderate recurrent major depression (HCC) 05/08/2021   Right arm pain 05/07/2021   Rash of hand 05/07/2021   Hyperkalemia 05/06/2021   History of adrenal insufficiency 05/06/2021   CAP (community acquired pneumonia) 05/05/2021   Hypoglycemia 04/27/2021   Postoperative hematoma involving genitourinary system following genitourinary  procedure    S/P cesarean section 02/17/2021   Acute esophagitis    Leg edema    History of preterm delivery, currently pregnant    Pre-existing type 1 diabetes mellitus during pregnancy in third trimester    Anemia during pregnancy    Type 1 diabetes mellitus affecting pregnancy in second trimester, antepartum    HFrEF (heart failure with reduced ejection fraction) (HCC)    Acute deep vein thrombosis (DVT) of brachial vein of right upper extremity (HCC) 12/17/2020   Anxiety 12/17/2020   Diabetic retinopathy (HCC) 12/17/2020   Acute on chronic combined systolic and diastolic CHF (congestive heart failure) (HCC)    Hyperemesis 12/13/2020   Type 1 diabetes mellitus with hypoglycemia without coma (HCC) 12/13/2020   Sunburn 12/13/2020   Hypothyroidism 12/10/2020   Malnutrition (HCC) 12/10/2020   Vitreous hemorrhage of right eye (HCC) 12/10/2020   Pleural effusion associated with pulmonary infection 11/25/2020   Pneumonia affecting pregnancy 11/24/2020   Diabetes mellitus affecting pregnancy, second trimester 11/24/2020   Edema 11/24/2020   Decreased urine output 11/24/2020   Shortness of breath    Zinc deficiency    SOB (shortness of breath)    Social problem 11/21/2020   Nausea and vomiting during pregnancy prior to [redacted] weeks gestation 11/21/2020   Low serum vitamin B12    Delivery with history of C-section 11/20/2020   Absolute anemia    Acute kidney  injury (HCC)    Nausea and vomiting during pregnancy 10/23/2020   History of premature delivery, currently pregnant, second trimester 10/13/2020   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   Depressive disorder 09/28/2018   Peripheral neuropathy 09/28/2018   Diabetic gastroparesis (HCC) 04/22/2016   Diabetic sensorimotor neuropathy (HCC) 04/19/2016   DKA (diabetic ketoacidoses) 04/03/2016   Microalbuminuria 09/26/2005     Physical Exam  Triage Vital Signs: ED Triage Vitals  Enc Vitals Group     BP 06/01/21 1426 122/89     Pulse  Rate 06/01/21 1426 94     Resp 06/01/21 1426 15     Temp 06/01/21 1426 (!) 97.5 F (36.4 C)     Temp Source 06/01/21 1426 Oral     SpO2 06/01/21 1426 97 %     Weight 06/01/21 1420 115 lb (52.2 kg)     Height 06/01/21 1420 5\' 1"  (1.549 m)     Head Circumference --      Peak Flow --      Pain Score 06/01/21 1420 0     Pain Loc --      Pain Edu? --      Excl. in GC? --     Most recent vital signs: Vitals:   06/01/21 1426 06/01/21 1556  BP:  97/76  Pulse:  90  Resp:  14  Temp: (!) 97.5 F (36.4 C)   SpO2:  100%     General: Awake, no distress.  CV:  Good peripheral perfusion.  Resp:  Normal effort.  Abd:  No distention.  Neuro:             Awake, Alert, Oriented x 3  Other:  Patient has very flat affect, soft voice, denies suicidal ideation   ED Results / Procedures / Treatments  Labs (all labs ordered are listed, but only abnormal results are displayed) Labs Reviewed  CBC WITH DIFFERENTIAL/PLATELET - Abnormal; Notable for the following components:      Result Value   RBC 3.35 (*)    Hemoglobin 10.2 (*)    HCT 33.3 (*)    All other components within normal limits  BASIC METABOLIC PANEL - Abnormal; Notable for the following components:   Glucose, Bld 69 (*)    Creatinine, Ser 1.30 (*)    Calcium 8.1 (*)    GFR, Estimated 57 (*)    All other components within normal limits  CBG MONITORING, ED - Abnormal; Notable for the following components:   Glucose-Capillary 62 (*)    All other components within normal limits  RESP PANEL BY RT-PCR (FLU A&B, COVID) [id number]  URINALYSIS, COMPLETE (UACMP) WITH MICROSCOPIC  CBG MONITORING, ED  POC URINE PREG, ED     EKG     RADIOLOGY    PROCEDURES:  Critical Care performed: Yes, see critical care procedure note(s)     MEDICATIONS ORDERED IN ED: Medications  dextrose 10 % infusion ( Intravenous New Bag/Given 06/01/21 1500)  dextrose 5 %-0.9 % sodium chloride infusion (has no administration in time range)      IMPRESSION / MDM / ASSESSMENT AND PLAN / ED COURSE  I reviewed the triage vital signs and the nursing notes.                              Differential diagnosis includes, but is not limited to, electrolyte abnormality, kidney injury, hypoglycemia, malnutrition, adrenal insufficiency  Patient is a 31 year old female with rather brittle  type 1 diabetes who presents with hypoglycemia.  Patient recently admitted for DKA, ultimately was taken off of her insulin pump and told to take a very low dose of NovoLog per sliding scale.  However patient has discussed this with her endocrinologist to put her back on her insulin pump however she has been off insulin pump since yesterday for unclear reasons and gave herself Lantus and NovoLog this morning.  Patient has a very flat affect and is currently refusing to eat saying that she just does not feel like she can.  Patient does have a history of diabetic gastroparesis and I suspect that this is likely the underlying cause.  Patient also postpartum and I question whether she has postpartum depression.  Would likely benefit from seeing psychiatry but she has not suicidal at this time no indication for IVC.  Given she has taken long-acting insulin has already demonstrated that she has been hypoglycemic and is not going to eat I do not think she is safe to be discharged and will likely need admission.     FINAL CLINICAL IMPRESSION(S) / ED DIAGNOSES   Final diagnoses:  Hypoglycemia     Rx / DC Orders   ED Discharge Orders     None        Note:  This document was prepared using Dragon voice recognition software and may include unintentional dictation errors.   Georga Hacking, MD 06/01/21 [phone]

## 2021-06-02 DIAGNOSIS — E11649 Type 2 diabetes mellitus with hypoglycemia without coma: Secondary | ICD-10-CM | POA: Diagnosis not present

## 2021-06-02 DIAGNOSIS — E2740 Unspecified adrenocortical insufficiency: Secondary | ICD-10-CM | POA: Diagnosis not present

## 2021-06-02 DIAGNOSIS — Z98891 History of uterine scar from previous surgery: Secondary | ICD-10-CM | POA: Diagnosis not present

## 2021-06-02 DIAGNOSIS — E162 Hypoglycemia, unspecified: Secondary | ICD-10-CM

## 2021-06-02 DIAGNOSIS — E1043 Type 1 diabetes mellitus with diabetic autonomic (poly)neuropathy: Secondary | ICD-10-CM | POA: Diagnosis not present

## 2021-06-02 DIAGNOSIS — Z9641 Presence of insulin pump (external) (internal): Secondary | ICD-10-CM | POA: Diagnosis not present

## 2021-06-02 DIAGNOSIS — F419 Anxiety disorder, unspecified: Secondary | ICD-10-CM | POA: Diagnosis not present

## 2021-06-02 DIAGNOSIS — F339 Major depressive disorder, recurrent, unspecified: Secondary | ICD-10-CM | POA: Diagnosis not present

## 2021-06-02 DIAGNOSIS — Z794 Long term (current) use of insulin: Secondary | ICD-10-CM | POA: Diagnosis not present

## 2021-06-02 DIAGNOSIS — K3184 Gastroparesis: Secondary | ICD-10-CM | POA: Diagnosis not present

## 2021-06-02 DIAGNOSIS — Z79899 Other long term (current) drug therapy: Secondary | ICD-10-CM | POA: Diagnosis not present

## 2021-06-02 DIAGNOSIS — Z79890 Hormone replacement therapy (postmenopausal): Secondary | ICD-10-CM | POA: Diagnosis not present

## 2021-06-02 DIAGNOSIS — E10319 Type 1 diabetes mellitus with unspecified diabetic retinopathy without macular edema: Secondary | ICD-10-CM | POA: Diagnosis not present

## 2021-06-02 DIAGNOSIS — Z833 Family history of diabetes mellitus: Secondary | ICD-10-CM | POA: Diagnosis not present

## 2021-06-02 DIAGNOSIS — E10649 Type 1 diabetes mellitus with hypoglycemia without coma: Secondary | ICD-10-CM | POA: Diagnosis not present

## 2021-06-02 DIAGNOSIS — E038 Other specified hypothyroidism: Secondary | ICD-10-CM | POA: Diagnosis not present

## 2021-06-02 DIAGNOSIS — E109 Type 1 diabetes mellitus without complications: Secondary | ICD-10-CM | POA: Diagnosis not present

## 2021-06-02 DIAGNOSIS — E039 Hypothyroidism, unspecified: Secondary | ICD-10-CM | POA: Diagnosis not present

## 2021-06-02 DIAGNOSIS — E1042 Type 1 diabetes mellitus with diabetic polyneuropathy: Secondary | ICD-10-CM | POA: Diagnosis not present

## 2021-06-02 DIAGNOSIS — I10 Essential (primary) hypertension: Secondary | ICD-10-CM | POA: Diagnosis not present

## 2021-06-02 DIAGNOSIS — D649 Anemia, unspecified: Secondary | ICD-10-CM | POA: Diagnosis not present

## 2021-06-02 DIAGNOSIS — Z20822 Contact with and (suspected) exposure to covid-19: Secondary | ICD-10-CM | POA: Diagnosis not present

## 2021-06-02 DIAGNOSIS — Z86718 Personal history of other venous thrombosis and embolism: Secondary | ICD-10-CM | POA: Diagnosis not present

## 2021-06-02 LAB — BASIC METABOLIC PANEL
Anion gap: 7 (ref 5–15)
BUN: 16 mg/dL (ref 6–20)
CO2: 24 mmol/L (ref 22–32)
Calcium: 7.4 mg/dL — ABNORMAL LOW (ref 8.9–10.3)
Chloride: 103 mmol/L (ref 98–111)
Creatinine, Ser: 1.23 mg/dL — ABNORMAL HIGH (ref 0.44–1.00)
GFR, Estimated: >60 mL/min (ref >60)
Glucose, Bld: 190 mg/dL — ABNORMAL HIGH (ref 70–99)
Potassium: 4.4 mmol/L (ref 3.5–5.1)
Sodium: 134 mmol/L — ABNORMAL LOW (ref 135–145)

## 2021-06-02 LAB — CBC
HCT: 28.7 % — ABNORMAL LOW (ref 36.0–46.0)
Hemoglobin: 9.2 g/dL — ABNORMAL LOW (ref 12.0–15.0)
MCH: 31 pg (ref 26.0–34.0)
MCHC: 32.1 g/dL (ref 30.0–36.0)
MCV: 96.6 fL (ref 80.0–100.0)
Platelets: 189 10*3/uL (ref 150–400)
RBC: 2.97 MIL/uL — ABNORMAL LOW (ref 3.87–5.11)
RDW: 14.9 % (ref 11.5–15.5)
WBC: 3.9 10*3/uL — ABNORMAL LOW (ref 4.0–10.5)
nRBC: 0 % (ref 0.0–0.2)

## 2021-06-02 LAB — POC URINE PREG, ED: Preg Test, Ur: NEGATIVE

## 2021-06-02 LAB — CBG MONITORING, ED
Glucose-Capillary: 149 mg/dL — ABNORMAL HIGH (ref 70–99)
Glucose-Capillary: 150 mg/dL — ABNORMAL HIGH (ref 70–99)
Glucose-Capillary: 179 mg/dL — ABNORMAL HIGH (ref 70–99)
Glucose-Capillary: 179 mg/dL — ABNORMAL HIGH (ref 70–99)
Glucose-Capillary: 237 mg/dL — ABNORMAL HIGH (ref 70–99)
Glucose-Capillary: 76 mg/dL (ref 70–99)

## 2021-06-02 MED ORDER — PROCHLORPERAZINE EDISYLATE 10 MG/2ML IJ SOLN
10.0000 mg | Freq: Once | INTRAMUSCULAR | Status: AC
  Administered 2021-06-02: 10 mg via INTRAVENOUS
  Filled 2021-06-02: qty 2

## 2021-06-02 MED ORDER — METOCLOPRAMIDE HCL 5 MG/ML IJ SOLN
5.0000 mg | Freq: Once | INTRAMUSCULAR | Status: AC
Start: 2021-06-02 — End: 2021-06-02
  Administered 2021-06-02: 5 mg via INTRAVENOUS
  Filled 2021-06-02: qty 2

## 2021-06-02 MED ORDER — INSULIN ASPART 100 UNIT/ML IJ SOLN
0.0000 [IU] | INTRAMUSCULAR | Status: DC
  Administered 2021-06-02 (×2): 1 [IU] via SUBCUTANEOUS
  Administered 2021-06-03 (×2): 2 [IU] via SUBCUTANEOUS
  Administered 2021-06-03 – 2021-06-04 (×3): 1 [IU] via SUBCUTANEOUS
  Filled 2021-06-02 (×7): qty 1

## 2021-06-02 MED ORDER — INSULIN GLARGINE-YFGN 100 UNIT/ML ~~LOC~~ SOLN
10.0000 [IU] | Freq: Every day | SUBCUTANEOUS | Status: DC
  Administered 2021-06-02 – 2021-06-04 (×2): 10 [IU] via SUBCUTANEOUS
  Filled 2021-06-02 (×4): qty 0.1

## 2021-06-02 MED ORDER — HYDROCORTISONE 5 MG PO TABS
15.0000 mg | ORAL_TABLET | Freq: Every day | ORAL | Status: DC
  Administered 2021-06-02 – 2021-06-03 (×2): 15 mg via ORAL
  Filled 2021-06-02 (×3): qty 1

## 2021-06-02 MED ORDER — HYDROCORTISONE 10 MG PO TABS
30.0000 mg | ORAL_TABLET | ORAL | Status: DC
  Administered 2021-06-02 – 2021-06-04 (×3): 30 mg via ORAL
  Filled 2021-06-02 (×4): qty 3

## 2021-06-02 MED ORDER — LOPERAMIDE HCL 2 MG PO CAPS
2.0000 mg | ORAL_CAPSULE | Freq: Once | ORAL | Status: AC
  Administered 2021-06-02: 2 mg via ORAL
  Filled 2021-06-02: qty 1

## 2021-06-02 NOTE — ED Notes (Signed)
Pt requesting something for diarrhea.  Hospitalist notified.

## 2021-06-02 NOTE — Discharge Instructions (Addendum)
On day of discharge, call Brooke (nurse at Dr. Darrol Jump office) on her office number [phone]) or cell number [phone]) to set up an appointment with Dr. Amalia Hailey.

## 2021-06-02 NOTE — ED Notes (Signed)
Pt provided a sandwich box.

## 2021-06-02 NOTE — Progress Notes (Addendum)
Inpatient Diabetes Program Recommendations  AACE/ADA: New Consensus Statement on Inpatient Glycemic Control   Target Ranges:  Prepandial:   less than 140 mg/dL      Peak postprandial:   less than 180 mg/dL (1-2 hours)      Critically ill patients:  140 - 180 mg/dL    Latest Reference Range & Units 06/02/21 05:54 06/02/21 07:56  Glucose-Capillary 70 - 99 mg/dL 625 (H) 638 (H)    Latest Reference Range & Units 06/01/21 14:18 06/01/21 15:33 06/01/21 20:17 06/01/21 22:10  Glucose-Capillary 70 - 99 mg/dL 62 (L) 80 81 80    Latest Reference Range & Units 06/02/21 06:24  CO2 22 - 32 mmol/L 24  Glucose 70 - 99 mg/dL 937 (H)  Anion gap 5 - 15  7   Review of Glycemic Control  Diabetes history: DM1 (makes NO insulin; requires basal, correction, and carbohydrate coverage insulin) Outpatient Diabetes medications: Lantus and Novolog when not on insulin pump; T-slim insulin pump with Novolog; per prior visit insulin pump settings should be: Basal 12A 0.2 units/hour 6A   0.3 units/hour Total Basal: 6.6 units per 24 hours Correction Factor 1:80 mg/dl (1 unit drops glucose 80 mg/dl) Carb Ratio 3:42 grams (1 unit covers 20 grams of carbs) Target glucose 110 mg/dl Current orders for Inpatient glycemic control: Novolog 0-6 units TID with meals, Novolog 0-5 units QHS; Cortef 15 mg daily at 16:00; D5NS@100  ml/hr, D10@100  ml/hr; Semglee 10 units daily (ordered at 8:18 am today).  Inpatient Diabetes Program Recommendations:    Insulin: Noted Semglee 10 units daily ordered today at 8:18 am and given at 11:14 today; also noted Cortef increased from 5 mg to 15 mg daily today.  Glucose down to 76 mg/dl at 87:68 pm today.  Please monitor CBGs at least Q4H. If any further hypoglycemia noted, please consider discontinuing Semglee.  Outpatient DM medications: Please consider discharging patient on very sensitive Novolog 0-6 units TID and HS correction scale and have her follow up with Dr. Amalia Hailey [facility]  Endocrinology) about recurrent hypoglycemia and regarding resuming insulin pump.  NOTE: Per H&P on 06/01/21 by Dr. Sedalia Muta, "Per ED note, EMS arrived and patient CBG was in the 40s, patient received 1 mg of glucagon and 15 g of p.o. glucose.  Per ED nursing note, patient CBG increased to 58 after glucagon.Patient was offered food several times by ED nurse and patient refused despite explanation that patient needs to eat in order for her blood glucose to come up. Per EDP, patient was told by her endocrinologist to turn the pump back on however her pump ran out of insulin.  Patient gave herself 8 units of Lantus and 2 units of short acting in the morning despite not eating anything.  Per note by J. Janee Morn, NP (with Psychiatry) on 06/01/21, " Patient tells me that when she woke up this morning she gave herself 8 units of Lantus and 2 units of NovoLog.  She did not eat breakfast but just had some glucose tabs.  Then was not feeling well and checked her blood sugar was in the 20s.  When EMS arrived her blood sugar was in the 40s.  They were not able to establish IV access and she got glucagon and some glucose tabs and blood sugar improved to the 60s.Review of patient's recent admission when she was discharged on 05/25/2021, patient came in with DKA and had frequent episodes of hypoglycemia.  She was ultimately taken off of her insulin pump and was advised to  take a low-dose sliding scale of NovoLog.  Patient tells me she was doing this but then spoke with her outpatient endocrinologist who recommended that she resume the insulin pump." Patient last inpatient 05/20/21-05/25/21 and when she was discharged she was instructed to take Novolog 2 units with meals plus Novolog 0-6 units Q4H for correction for DM control.  Called Brooke (nurse at Dr. Darrol Jump office, Nehemiah Settle provided her office number [phone]) and cell number [phone]) during prior conversation) regarding patient admitted again with hypoglycemia. Nehemiah Settle reports  that she spoke with patient after last hospital discharge and patient stated she was going to put her Dexcom CGM sensor back on and Brooke advised her to continue using the Novolog correction scale as needed for now. Therefore, patient was not instructed to resume her insulin pump by Endocrinology. Brooke asked that patient call her directly when she is discharged so that patient can get an appointment to be worked in. Sent secure message to Dr. Dareen Piano at 8:28 am regarding patient and recurrent hypoglycemia.  Thanks, Orlando Penner, RN, MSN, CDE Diabetes Coordinator Inpatient Diabetes Program [phone] (Team Pager from 8am to 5pm)

## 2021-06-02 NOTE — ED Notes (Signed)
Pt attempting to eat dinner.  C/o nausea.  Hospitalist made aware.

## 2021-06-02 NOTE — Progress Notes (Signed)
PROGRESS NOTE  Ashley Camacho    DOB: May 09, 1991, 31 y.o.  [id number]  PCP: Pcp, No   Code Status: Full Code   DOA: 06/01/2021   LOS: 0  Brief Narrative of Current Hospitalization  Ashley Camacho is a 31 y.o. female with a PMH significant for type 1 diabetes, depression/anxiety, hypothyroidism, adrenal insufficiency, type I polyneuropathy, chronic anemia. They presented from home to the ED on 06/01/2021 with severe hypoglycemia with blood sugar reading in the 40s without coma. In the ED, it was found that they had blood glucose of 69 which was s/p D5 ampule with EMS. They were treated with D5 and D10 infusion.  Patient was admitted to medicine service for further workup and management of hypoglycemia as outlined in detail below.  06/02/21 -stable, remains borderline hypoglycemic  Assessment & Plan  Principal Problem:   Hypoglycemia Active Problems:   Social problem   Anxiety   Brittle diabetes ([place])   Depressive disorder   Hypothyroidism   Malnutrition (HCC)   S/P cesarean section   DKA, type 1 (HCC)   Adrenal insufficiency (HCC)  Type I DM- presenting with severe hypoglycemia-presumed secondary to insulin use without p.o. intake. Blood sugars remain borderline low.  - diabetes education - sliding scale - discontinue D5 gtt when taking good PO - continue q4hr CBG monitoring - basal semglee  Adrenal insufficiency-concern for adrenal crisis given low blood pressures in acutely ill setting. Na+ 134. Systolics in 0000000. -Consulted endocrinology, reached out to Dr. Gabriel Carina for assistance in managing.  It appears that the patient was a previous patient at this practice and had been dismissed. -Triple patient's home dose of hydrocortisone x3 days - BMP am - f/u with [facility] outpatient   Anxiety/Depression- chronic, stable. Psychiatry evaluated - Currently not on any antidepression medications   Normocytic anemia- chronic, stable   Hypothyroidism- - continue levothyroxine 25 mcg  every morning  DVT prophylaxis: enoxaparin (LOVENOX) injection 40 mg Start: 06/01/21 2200 Place TED hose Start: 06/01/21 1619   Diet:  Diet Orders (From admission, onward)     Start     Ordered   06/01/21 1620  Diet Carb Modified Fluid consistency: Thin; Room service appropriate? Yes  Diet effective now       Question Answer Comment  Diet-HS Snack? Nothing   Calorie Level Medium 1600-2000   Fluid consistency: Thin   Room service appropriate? Yes      06/01/21 1620            Subjective 06/02/21    Pt reports feeling improved. Denies any complaints at this time.   Disposition Plan & Communication  Patient status: Observation  Admitted From: Home Disposition: Home Anticipated discharge date: 1/11  Family Communication: none  Consults, Procedures, Significant Events  Consultants:  endocrinology  Procedures/significant events:  None  Antimicrobials:  Anti-infectives (From admission, onward)    None       Objective   Vitals:   06/02/21 0630 06/02/21 0700 06/02/21 0715 06/02/21 0730  BP: 98/74 95/73  99/77  Pulse: 81 79 79 86  Resp: 18 18  18   Temp:      TempSrc:      SpO2: 98% 99% 98% 98%  Weight:      Height:        Intake/Output Summary (Last 24 hours) at 06/02/2021 0808 Last data filed at 06/01/2021 1622 Gross per 24 hour  Intake 136.67 ml  Output --  Net 136.67 ml   [employer]  06/01/21 1420  Weight: 52.2 kg    Patient BMI: Body mass index is 21.73 kg/m.   Physical Exam:  General: awake, alert, NAD HEENT: atraumatic, clear conjunctiva, anicteric sclera, MMM, hearing grossly normal Respiratory: normal respiratory effort. Cardiovascular: quick capillary refill  Gastrointestinal: soft, NT, ND Nervous: A&O x3. Extremities: moves all equally, no edema Skin: dry, intact, normal temperature, normal color. No rashes, lesions or ulcers on exposed skin Psychiatry: flat mood  Labs   I have personally reviewed following labs and imaging  studies Admission on 06/01/2021  Component Date Value Ref Range Status   Glucose-Capillary 06/01/2021 62 (L)  70 - 99 mg/dL Final   WBC 06/01/2021 5.5  4.0 - 10.5 K/uL Final   RBC 06/01/2021 3.35 (L)  3.87 - 5.11 MIL/uL Final   Hemoglobin 06/01/2021 10.2 (L)  12.0 - 15.0 g/dL Final   HCT 06/01/2021 33.3 (L)  36.0 - 46.0 % Final   MCV 06/01/2021 99.4  80.0 - 100.0 fL Final   MCH 06/01/2021 30.4  26.0 - 34.0 pg Final   MCHC 06/01/2021 30.6  30.0 - 36.0 g/dL Final   RDW 06/01/2021 15.1  11.5 - 15.5 % Final   Platelets 06/01/2021 208  150 - 400 K/uL Final   nRBC 06/01/2021 0.0  0.0 - 0.2 % Final   Neutrophils Relative % 06/01/2021 63  % Final   Neutro Abs 06/01/2021 3.4  1.7 - 7.7 K/uL Final   Lymphocytes Relative 06/01/2021 27  % Final   Lymphs Abs 06/01/2021 1.5  0.7 - 4.0 K/uL Final   Monocytes Relative 06/01/2021 7  % Final   Monocytes Absolute 06/01/2021 0.4  0.1 - 1.0 K/uL Final   Eosinophils Relative 06/01/2021 2  % Final   Eosinophils Absolute 06/01/2021 0.1  0.0 - 0.5 K/uL Final   Basophils Relative 06/01/2021 1  % Final   Basophils Absolute 06/01/2021 0.1  0.0 - 0.1 K/uL Final   Immature Granulocytes 06/01/2021 0  % Final   Abs Immature Granulocytes 06/01/2021 0.01  0.00 - 0.07 K/uL Final   Sodium 06/01/2021 137  135 - 145 mmol/L Final   Potassium 06/01/2021 4.0  3.5 - 5.1 mmol/L Final   Chloride 06/01/2021 103  98 - 111 mmol/L Final   CO2 06/01/2021 27  22 - 32 mmol/L Final   Glucose, Bld 06/01/2021 69 (L)  70 - 99 mg/dL Final   BUN 06/01/2021 17  6 - 20 mg/dL Final   Creatinine, Ser 06/01/2021 1.30 (H)  0.44 - 1.00 mg/dL Final   Calcium 06/01/2021 8.1 (L)  8.9 - 10.3 mg/dL Final   GFR, Estimated 06/01/2021 57 (L)  >60 mL/min Final   Anion gap 06/01/2021 7  5 - 15 Final   Glucose-Capillary 06/01/2021 80  70 - 99 mg/dL Final   SARS Coronavirus 2 by RT PCR 06/01/2021 NEGATIVE  NEGATIVE Final   Influenza A by PCR 06/01/2021 NEGATIVE  NEGATIVE Final   Influenza B by PCR  06/01/2021 NEGATIVE  NEGATIVE Final   Color, Urine 06/01/2021 YELLOW (A)  YELLOW Final   APPearance 06/01/2021 HAZY (A)  CLEAR Final   Specific Gravity, Urine 06/01/2021 1.016  1.005 - 1.030 Final   pH 06/01/2021 5.0  5.0 - 8.0 Final   Glucose, UA 06/01/2021 NEGATIVE  NEGATIVE mg/dL Final   Hgb urine dipstick 06/01/2021 MODERATE (A)  NEGATIVE Final   Bilirubin Urine 06/01/2021 NEGATIVE  NEGATIVE Final   Ketones, ur 06/01/2021 NEGATIVE  NEGATIVE mg/dL Final   Protein, ur  06/01/2021 >=300 (A)  NEGATIVE mg/dL Final   Nitrite 06/01/2021 NEGATIVE  NEGATIVE Final   Leukocytes,Ua 06/01/2021 SMALL (A)  NEGATIVE Final   RBC / HPF 06/01/2021 6-10  0 - 5 RBC/hpf Final   WBC, UA 06/01/2021 21-50  0 - 5 WBC/hpf Final   Bacteria, UA 06/01/2021 RARE (A)  NONE SEEN Final   Squamous Epithelial / LPF 06/01/2021 6-10  0 - 5 Final   Mucus 06/01/2021 PRESENT   Final   Hyaline Casts, UA 06/01/2021 PRESENT   Final   Preg Test, Ur 06/01/2021 NEGATIVE  NEGATIVE Final   Magnesium 06/01/2021 2.0  1.7 - 2.4 mg/dL Final   Phosphorus 06/01/2021 3.6  2.5 - 4.6 mg/dL Final   Sodium 06/02/2021 134 (L)  135 - 145 mmol/L Final   Potassium 06/02/2021 4.4  3.5 - 5.1 mmol/L Final   Chloride 06/02/2021 103  98 - 111 mmol/L Final   CO2 06/02/2021 24  22 - 32 mmol/L Final   Glucose, Bld 06/02/2021 190 (H)  70 - 99 mg/dL Final   BUN 06/02/2021 16  6 - 20 mg/dL Final   Creatinine, Ser 06/02/2021 1.23 (H)  0.44 - 1.00 mg/dL Final   Calcium 06/02/2021 7.4 (L)  8.9 - 10.3 mg/dL Final   GFR, Estimated 06/02/2021 >60  >60 mL/min Final   Anion gap 06/02/2021 7  5 - 15 Final   WBC 06/02/2021 3.9 (L)  4.0 - 10.5 K/uL Final   RBC 06/02/2021 2.97 (L)  3.87 - 5.11 MIL/uL Final   Hemoglobin 06/02/2021 9.2 (L)  12.0 - 15.0 g/dL Final   HCT 06/02/2021 28.7 (L)  36.0 - 46.0 % Final   MCV 06/02/2021 96.6  80.0 - 100.0 fL Final   MCH 06/02/2021 31.0  26.0 - 34.0 pg Final   MCHC 06/02/2021 32.1  30.0 - 36.0 g/dL Final   RDW 06/02/2021  14.9  11.5 - 15.5 % Final   Platelets 06/02/2021 189  150 - 400 K/uL Final   nRBC 06/02/2021 0.0  0.0 - 0.2 % Final   Glucose-Capillary 06/01/2021 81  70 - 99 mg/dL Final   Glucose-Capillary 06/01/2021 80  70 - 99 mg/dL Final   Glucose-Capillary 06/02/2021 150 (H)  70 - 99 mg/dL Final   Glucose-Capillary 06/02/2021 237 (H)  70 - 99 mg/dL Final    Imaging Studies  No results found.  Medications   Scheduled Meds:  enoxaparin (LOVENOX) injection  40 mg Subcutaneous Q24H   hydrocortisone  10 mg Oral [id number]   hydrocortisone  5 mg Oral q1600   insulin aspart  0-5 Units Subcutaneous QHS   insulin aspart  0-6 Units Subcutaneous TID WC   levothyroxine  25 mcg Oral Q0600   No recently discontinued medications to reconcile  LOS: 0 days   Richarda Osmond, DO [facility] 06/02/2021, 8:08 AM   Available by Epic secure chat 7AM-7PM. If 7PM-7AM, please contact night-coverage Refer to [email or web address] to contact the [facility] Attending or Consulting provider for this pt

## 2021-06-03 DIAGNOSIS — E10649 Type 1 diabetes mellitus with hypoglycemia without coma: Secondary | ICD-10-CM | POA: Diagnosis not present

## 2021-06-03 DIAGNOSIS — E11649 Type 2 diabetes mellitus with hypoglycemia without coma: Secondary | ICD-10-CM | POA: Diagnosis present

## 2021-06-03 LAB — BASIC METABOLIC PANEL
Anion gap: 5 (ref 5–15)
BUN: 16 mg/dL (ref 6–20)
CO2: 23 mmol/L (ref 22–32)
Calcium: 7.9 mg/dL — ABNORMAL LOW (ref 8.9–10.3)
Chloride: 107 mmol/L (ref 98–111)
Creatinine, Ser: 1.46 mg/dL — ABNORMAL HIGH (ref 0.44–1.00)
GFR, Estimated: 49 mL/min — ABNORMAL LOW (ref >60)
Glucose, Bld: 157 mg/dL — ABNORMAL HIGH (ref 70–99)
Potassium: 4.6 mmol/L (ref 3.5–5.1)
Sodium: 135 mmol/L (ref 135–145)

## 2021-06-03 LAB — CBG MONITORING, ED
Glucose-Capillary: 170 mg/dL — ABNORMAL HIGH (ref 70–99)
Glucose-Capillary: 114 mg/dL — ABNORMAL HIGH (ref 70–99)
Glucose-Capillary: 138 mg/dL — ABNORMAL HIGH (ref 70–99)

## 2021-06-03 LAB — GLUCOSE, CAPILLARY
Glucose-Capillary: 202 mg/dL — ABNORMAL HIGH (ref 70–99)
Glucose-Capillary: 239 mg/dL — ABNORMAL HIGH (ref 70–99)

## 2021-06-03 MED ORDER — SODIUM CHLORIDE 0.9 % IV SOLN: INTRAVENOUS | Status: DC

## 2021-06-03 MED ORDER — METOCLOPRAMIDE HCL 5 MG/ML IJ SOLN
10.0000 mg | Freq: Three times a day (TID) | INTRAMUSCULAR | Status: DC
  Administered 2021-06-03 – 2021-06-04 (×4): 10 mg via INTRAVENOUS
  Filled 2021-06-03 (×4): qty 2

## 2021-06-03 NOTE — ED Notes (Signed)
Ericka RN aware of assigned bed 

## 2021-06-03 NOTE — Plan of Care (Addendum)
Patient arrived to floor, alert and oriented.  Patient ambulated independently to bed. Oriented patient to room and call bell.

## 2021-06-03 NOTE — Progress Notes (Signed)
PROGRESS NOTE    Ashley Camacho  HQP:[id number] DOB: 09/20/1990 DOA: 06/01/2021 PCP: Pcp, No    Brief Narrative:  31 y.o. female with a PMH significant for type 1 diabetes, depression/anxiety, hypothyroidism, adrenal insufficiency, type I polyneuropathy, chronic anemia. They presented from home to the ED on 06/01/2021 with severe hypoglycemia with blood sugar reading in the 40s without coma. In the ED, it was found that they had blood glucose of 69 which was s/p D5 ampule with EMS. They were treated with D5 and D10 infusion.  Patient was admitted to medicine service for further workup and management of hypoglycemia as outlined in detail below.  Glycemic control has improved.  Patient remains symptomatic.  Nausea vomiting to poor tolerance of p.o.  Assessment & Plan:   Principal Problem:   Hypoglycemia Active Problems:   Social problem   Anxiety   Brittle diabetes (HCC)   Depressive disorder   Hypothyroidism   Malnutrition (HCC)   S/P cesarean section   DKA, type 1 (HCC)   Adrenal insufficiency (HCC)   Hypoglycemia associated with diabetes (HCC)  Type I DM presenting with severe hypoglycemia presumed secondary to insulin use without p.o. intake.  Glycemic control is improved Plan: Normal saline 75 cc/h Every 4 hour CBG monitoring, sensitive scale Basal Semglee, low threshold to hold Anticipate discharge 1/12   Adrenal insufficiency Concern for acute adrenal insufficiency given relative hypotension on admission Suspect this is multifactorial Presentation not entirely consistent with acute adrenal crisis Plan: Continue high-dose hydrocortisone x3 days Can resume home regimen at time of discharge Follow-[facility] outpatient [facility]   Anxiety/Depression chronic, stable. Psychiatry evaluated - Currently not on any antidepression medications   Normocytic anemia- chronic, stable   Hypothyroidism- - continue levothyroxine 25 mcg every morning   DVT prophylaxis: SQ Lovenox Code  Status: Full Family Communication: None today.  Offered to call but patient declined Disposition Plan: Status is: Inpatient  Remains inpatient appropriate because: Severe symptomatic hypoglycemia.  Slowly improving.  Anticipated discharge 1/12       Level of care: Med-Surg  Consultants:  None  Procedures:  None  Antimicrobials: None   Subjective: Seen and examined.  Sitting up trying to eat.  Endorses some abdominal pain associated with nausea.  No other issues.  Objective: Vitals:   06/03/21 0700 06/03/21 0800 06/03/21 1100 06/03/21 1400  BP: 98/78 (!) 117/92 98/73 103/78  Pulse: 78 96 83 84  Resp: 11 14 14 16   Temp:      TempSrc:      SpO2: 99% 99% 97% 96%  Weight:      Height:       No intake or output data in the 24 hours ending 06/03/21 1541 Filed Weights   06/01/21 1420  Weight: 52.2 kg    Examination:  General exam: No acute distress appears frail Respiratory system: Clear.  Normal work of breathing.  Room air Cardiovascular system: S1-S2, RRR, no murmurs, no pedal edema Gastrointestinal system: Soft, NT/ND, normal bowel sounds Central nervous system: Alert and oriented. No focal neurological deficits. Extremities: Symmetric 5 x 5 power. Skin: Pale, no obvious rashes or lesions Psychiatry: Judgement and insight appear normal. Mood & affect appropriate.     Data Reviewed: I have personally reviewed following labs and imaging studies  CBC: Recent Labs  Lab 06/01/21 1450 06/02/21 0624  WBC 5.5 3.9*  NEUTROABS 3.4  --   HGB 10.2* 9.2*  HCT 33.3* 28.7*  MCV 99.4 96.6  PLT 208 189   Basic  Metabolic Panel: Recent Labs  Lab 06/01/21 1450 06/02/21 0624 06/03/21 0626  NA 137 134* 135  K 4.0 4.4 4.6  CL 103 103 107  CO2 27 24 23   GLUCOSE 69* 190* 157*  BUN 17 16 16   CREATININE 1.30* 1.23* 1.46*  CALCIUM 8.1* 7.4* 7.9*  MG 2.0  --   --   PHOS 3.6  --   --    GFR: Estimated Creatinine Clearance: 42.5 mL/min (A) (by C-G formula based  on SCr of 1.46 mg/dL (H)). Liver Function Tests: No results for input(s): AST, ALT, ALKPHOS, BILITOT, PROT, ALBUMIN in the last 168 hours. No results for input(s): LIPASE, AMYLASE in the last 168 hours. No results for input(s): AMMONIA in the last 168 hours. Coagulation Profile: No results for input(s): INR, PROTIME in the last 168 hours. Cardiac Enzymes: No results for input(s): CKTOTAL, CKMB, CKMBINDEX, TROPONINI in the last 168 hours. BNP (last 3 results) No results for input(s): PROBNP in the last 8760 hours. HbA1C: No results for input(s): HGBA1C in the last 72 hours. CBG: Recent Labs  Lab 06/02/21 1959 06/02/21 2353 06/03/21 0355 06/03/21 0805 06/03/21 1125  GLUCAP 179* 179* 170* 114* 138*   Lipid Profile: No results for input(s): CHOL, HDL, LDLCALC, TRIG, CHOLHDL, LDLDIRECT in the last 72 hours. Thyroid Function Tests: No results for input(s): TSH, T4TOTAL, FREET4, T3FREE, THYROIDAB in the last 72 hours. Anemia Panel: No results for input(s): VITAMINB12, FOLATE, FERRITIN, TIBC, IRON, RETICCTPCT in the last 72 hours. Sepsis Labs: No results for input(s): PROCALCITON, LATICACIDVEN in the last 168 hours.  Recent Results (from the past 240 hour(s))  Resp Panel by RT-PCR (Flu A&B, Covid) Nasopharyngeal Swab     Status: None   Collection Time: 06/01/21  4:25 PM   Specimen: Nasopharyngeal Swab; Nasopharyngeal(NP) swabs in vial transport medium  Result Value Ref Range Status   SARS Coronavirus 2 by RT PCR NEGATIVE NEGATIVE Final    Comment: (NOTE) SARS-CoV-2 target nucleic acids are NOT DETECTED.  The SARS-CoV-2 RNA is generally detectable in upper respiratory specimens during the acute phase of infection. The lowest concentration of SARS-CoV-2 viral copies this assay can detect is 138 copies/mL. A negative result does not preclude SARS-Cov-2 infection and should not be used as the sole basis for treatment or other patient management decisions. A negative result may occur  with  improper specimen collection/handling, submission of specimen other than nasopharyngeal swab, presence of viral mutation(s) within the areas targeted by this assay, and inadequate number of viral copies(<138 copies/mL). A negative result must be combined with clinical observations, patient history, and epidemiological information. The expected result is Negative.  Fact Sheet for Patients:  BloggerCourse.com  Fact Sheet for Healthcare Providers:  SeriousBroker.it  This test is no t yet approved or cleared by the [place] FDA and  has been authorized for detection and/or diagnosis of SARS-CoV-2 by FDA under an Emergency Use Authorization (EUA). This EUA will remain  in effect (meaning this test can be used) for the duration of the COVID-19 declaration under Section 564(b)(1) of the Act, 21 U.S.C.section 360bbb-3(b)(1), unless the authorization is terminated  or revoked sooner.       Influenza A by PCR NEGATIVE NEGATIVE Final   Influenza B by PCR NEGATIVE NEGATIVE Final    Comment: (NOTE) The Xpert Xpress SARS-CoV-2/FLU/RSV plus assay is intended as an aid in the diagnosis of influenza from Nasopharyngeal swab specimens and should not be used as a sole basis for treatment. Nasal washings and  aspirates are unacceptable for Xpert Xpress SARS-CoV-2/FLU/RSV testing.  Fact Sheet for Patients: BloggerCourse.com  Fact Sheet for Healthcare Providers: SeriousBroker.it  This test is not yet approved or cleared by the [place] FDA and has been authorized for detection and/or diagnosis of SARS-CoV-2 by FDA under an Emergency Use Authorization (EUA). This EUA will remain in effect (meaning this test can be used) for the duration of the COVID-19 declaration under Section 564(b)(1) of the Act, 21 U.S.C. section 360bbb-3(b)(1), unless the authorization is terminated  or revoked.  Performed at [facility], [address], [place], [place] [address]          Radiology Studies: No results found.      Scheduled Meds:  enoxaparin (LOVENOX) injection  40 mg Subcutaneous Q24H   hydrocortisone  15 mg Oral q1600   hydrocortisone  30 mg Oral BH-q7a   insulin aspart  0-6 Units Subcutaneous Q4H   insulin glargine-yfgn  10 Units Subcutaneous Daily   levothyroxine  25 mcg Oral Q0600   metoCLOPramide (REGLAN) injection  10 mg Intravenous Q8H   Continuous Infusions:  sodium chloride 75 mL/hr at 06/03/21 0815     LOS: 1 day    Time spent: 35 minutes    Tresa Moore, MD [facility]   If 7PM-7AM, please contact night-coverage  06/03/2021, 3:41 PM

## 2021-06-03 NOTE — TOC Progression Note (Signed)
Transition of Care [facility]) - Progression Note    Patient Details  Name: Ashley Camacho MRN: [id number] Date of Birth: 11/03/1990  Transition [facility]) CM/SW Contact  Allayne Butcher, RN Phone Number: 06/03/2021, 4:28 PM  Clinical Narrative:      Transition of Care [facility]) Screening Note   Patient Details  Name: Ashley Camacho Date of Birth: 10/17/1990   Transition of Care [facility]) CM/SW Contact:    Allayne Butcher, RN Phone Number: 06/03/2021, 4:28 PM    Transition of Care Department [facility]) has reviewed patient and no TOC needs have been identified at this time. We will continue to monitor patient advancement through interdisciplinary progression rounds. If new patient transition needs arise, please place a TOC consult.         Expected Discharge Plan and Services                                                 Social Determinants of Health (SDOH) Interventions    Readmission Risk Interventions Readmission Risk Prevention Plan 06/03/2021 05/22/2021 05/07/2021  Transportation Screening Complete Complete Complete  PCP or Specialist Appt within 3-5 Days - Complete Complete  HRI or Home Care Consult - Complete Complete  Social Work Consult for Recovery Care Planning/Counseling - Complete Complete  Palliative Care Screening - Not Applicable Not Applicable  Medication Review [employer]) Complete Complete Referral to Pharmacy  PCP or Specialist appointment within 3-5 days of discharge Complete - -  HRI or Home Care Consult Not Complete - -  HRI or Home Care Consult Pt Refusal Comments NA - -  SW Recovery Care/Counseling Consult Complete - -  Palliative Care Screening Not Applicable - -  Skilled Nursing Facility Not Applicable - -  Some recent data might be hidden

## 2021-06-04 DIAGNOSIS — E10649 Type 1 diabetes mellitus with hypoglycemia without coma: Secondary | ICD-10-CM | POA: Diagnosis not present

## 2021-06-04 LAB — GLUCOSE, CAPILLARY
Glucose-Capillary: 109 mg/dL — ABNORMAL HIGH (ref 70–99)
Glucose-Capillary: 170 mg/dL — ABNORMAL HIGH (ref 70–99)
Glucose-Capillary: 189 mg/dL — ABNORMAL HIGH (ref 70–99)
Glucose-Capillary: 193 mg/dL — ABNORMAL HIGH (ref 70–99)

## 2021-06-04 MED ORDER — HYDROCORTISONE 10 MG PO TABS: 10.0000 mg | ORAL_TABLET | ORAL | 0 refills | Status: DC

## 2021-06-04 MED ORDER — METOCLOPRAMIDE HCL 10 MG PO TABS
10.0000 mg | ORAL_TABLET | Freq: Three times a day (TID) | ORAL | 0 refills | Status: DC
Start: 2021-06-04 — End: 2021-06-19

## 2021-06-04 MED ORDER — HYDROCORTISONE 10 MG PO TABS
10.0000 mg | ORAL_TABLET | ORAL | Status: DC
  Filled 2021-06-04: qty 1

## 2021-06-04 MED ORDER — HYDROCORTISONE 5 MG PO TABS
5.0000 mg | ORAL_TABLET | Freq: Every day | ORAL | Status: DC
  Administered 2021-06-04: 5 mg via ORAL
  Filled 2021-06-04: qty 1

## 2021-06-04 MED ORDER — ALUM & MAG HYDROXIDE-SIMETH 200-200-20 MG/5ML PO SUSP
30.0000 mL | ORAL | Status: DC | PRN
  Administered 2021-06-04 (×2): 30 mL via ORAL
  Filled 2021-06-04 (×2): qty 30

## 2021-06-04 MED ORDER — HYDROCORTISONE 5 MG PO TABS: 5.0000 mg | ORAL_TABLET | Freq: Every day | ORAL | 0 refills | Status: DC

## 2021-06-04 NOTE — Discharge Summary (Signed)
Physician Discharge Summary  Ashley Camacho [id number] DOB: January 07, 1991 DOA: 06/01/2021  PCP: Pcp, No  Admit date: 06/01/2021 Discharge date: 06/04/2021  Admitted From: Home Disposition: Home  Recommendations for Outpatient Follow-up:  Follow up with PCP in 1-2 weeks Follow-up with endocrinology 1 day after discharge  Home Health: No Equipment/Devices: None  Discharge Condition: Stable CODE STATUS: Full Diet recommendation: Carb modified  Brief/Interim Summary:  31 y.o. female with a PMH significant for type 1 diabetes, depression/anxiety, hypothyroidism, adrenal insufficiency, type I polyneuropathy, chronic anemia. They presented from home to the ED on 06/01/2021 with severe hypoglycemia with blood sugar reading in the 40s without coma. In the ED, it was found that they had blood glucose of 69 which was s/p D5 ampule with EMS. They were treated with D5 and D10 infusion.  Patient was admitted to medicine service for further workup and management of hypoglycemia as outlined in detail below.   Glycemic control has improved.  Patient remains symptomatic.  Nausea and vomiting have improved but not entirely resolved at the time of discharge.  Home incarceration with patient states is a chronic issue.  Will recommend Reglan 10 mg p.o. 3 times daily.  Recommend take 20 to 30 minutes prior to meal attempts.  After conversation with diabetes coordinator decision made to advise patient to discontinue her insulin pump.  She is given phone numbers to call her endocrinologist office.  She is encouraged to call the day of discharge to arrange very close follow-up.  Hopefully the patient can be seen within 24 to 48 hours to discuss further diabetes management.  At time of discharge will recommend NovoLog via sliding scale.  I did confirm with patient that she has NovoLog at home, has a glucometer and will be able to monitor her blood sugars.  She will be discharged home  Discharge Diagnoses:  Principal  Problem:   Hypoglycemia Active Problems:   Social problem   Anxiety   Brittle diabetes ([place])   Depressive disorder   Hypothyroidism   Malnutrition ([place])   S/P cesarean section   DKA, type 1 (HCC)   Adrenal insufficiency (H[facility])   Hypoglycemia associated with diabetes ([place])  Type I DM presenting with severe hypoglycemia presumed secondary to insulin use without p.o. intake.  Glycemic control is improved At time of discharge will recommend stopping insulin pump.  Patient will call her endocrinologist office on the day of discharge to arrange very close follow-up.  Recommend NovoLog at home via sliding scale.  Patient has a glucometer.  I confirm that she has all necessary medications and supplies to be able to monitor her sugars at home without use of insulin pump.   Adrenal insufficiency Concern for acute adrenal insufficiency given relative hypotension on admission Suspect this is multifactorial Presentation not entirely consistent with acute adrenal crisis Patient was on high-dose Cardizem drip cortisone for 2 days.  At time of discharge will decrease back to home dose of 10 mg hydrocortisone every morning, 5 mg every afternoon.  Provided a 1 month supply and sent prescription to patient's preferred pharmacy.  Follow[facility]up outpatient at [facility].  Anxiety/Depression chronic, stable. Psychiatry evaluated - Currently not on any antidepression medications   Normocytic anemia- chronic, stable   Hypothyroidism- - continue levothyroxine 25 mcg every morning  Discharge Instructions  Discharge Instructions     Diet - low sodium heart healthy   Complete by: As directed    Increase activity slowly   Complete by: As directed  Allergies as of 06/04/2021   No Known Allergies      Medication List     TAKE these medications    Acetaminophen Extra Strength 500 MG tablet Generic drug: acetaminophen Take 2 tablets (1,000 mg total) by mouth every 6 (six) hours as needed for  headache, fever or moderate pain.   hydrocortisone 10 MG tablet Commonly known as: CORTEF Take 1 tablet (10 mg total) by mouth once every morning.   hydrocortisone 5 MG tablet Commonly known as: CORTEF Take 1 tablet (5 mg total) by mouth once daily at 4PM.   levothyroxine 25 MCG tablet Commonly known as: SYNTHROID Take 1 tablet (25 mcg total) by mouth once daily at 6 (six) AM.   metoCLOPramide 10 MG tablet Commonly known as: REGLAN Take 1 tablet (10 mg total) by mouth 3 (three) times daily with meals.   multivitamin with minerals Tabs tablet Take 1 tablet by mouth daily.   NovoLOG FlexPen 100 UNIT/ML FlexPen Generic drug: insulin aspart Inject 2 Units into the skin 3 (three) times daily with meals Inject 0-6 Units into the skin every 4 (four) hours. What changed: Another medication with the same name was removed. Continue taking this medication, and follow the directions you see here.        No Known Allergies  Consultations: Diabetes coordinator   Procedures/Studies: [place] EKG SITE RITE  Result Date: 05/07/2021 If Site Rite image not attached, placement could not be confirmed due to current cardiac rhythm.  [place] EKG SITE RITE  Result Date: 05/07/2021 If Site Rite image not attached, placement could not be confirmed due to current cardiac rhythm.     Subjective: Seen and examined the day of discharge.  Stable no distress.  Still with some low-grade nausea but no vomiting.  Stable for discharge home.  Sugars improved.  Discharge Exam: Vitals:   06/04/21 0351 06/04/21 0809  BP: 118/90 (!) 121/92  Pulse: 80 83  Resp: 18 16  Temp: 97.9 F (36.6 C) (!) 97.4 F (36.3 C)  SpO2: 100% 99%   Vitals:   06/03/21 1657 06/03/21 2004 06/04/21 0351 06/04/21 0809  BP: (!) 117/91 112/89 118/90 (!) 121/92  Pulse: 85 88 80 83  Resp: 20  18 16   Temp: 97.8 F (36.6 C) 97.6 F (36.4 C) 97.9 F (36.6 C) (!) 97.4 F (36.3 C)  TempSrc:   Oral   SpO2: 100% 97% 100% 99%   Weight:      Height:        General: Pt is alert, awake, not in acute distress Cardiovascular: RRR, S1/S2 +, no rubs, no gallops Respiratory: CTA bilaterally, no wheezing, no rhonchi Abdominal: Soft, NT, ND, bowel sounds + Extremities: no edema, no cyanosis    The results of significant diagnostics from this hospitalization (including imaging, microbiology, ancillary and laboratory) are listed below for reference.     Microbiology: Recent Results (from the past 240 hour(s))  Resp Panel by RT-PCR (Flu A&B, Covid) Nasopharyngeal Swab     Status: None   Collection Time: 06/01/21  4:25 PM   Specimen: Nasopharyngeal Swab; Nasopharyngeal(NP) swabs in vial transport medium  Result Value Ref Range Status   SARS Coronavirus 2 by RT PCR NEGATIVE NEGATIVE Final    Comment: (NOTE) SARS-CoV-2 target nucleic acids are NOT DETECTED.  The SARS-CoV-2 RNA is generally detectable in upper respiratory specimens during the acute phase of infection. The lowest concentration of SARS-CoV-2 viral copies this assay can detect is 138 copies/mL. A negative result does  not preclude SARS-Cov-2 infection and should not be used as the sole basis for treatment or other patient management decisions. A negative result may occur with  improper specimen collection/handling, submission of specimen other than nasopharyngeal swab, presence of viral mutation(s) within the areas targeted by this assay, and inadequate number of viral copies(<138 copies/mL). A negative result must be combined with clinical observations, patient history, and epidemiological information. The expected result is Negative.  Fact Sheet for Patients:  EntrepreneurPulse.com.au  Fact Sheet for Healthcare Providers:  IncredibleEmployment.be  This test is no t yet approved or cleared by the [place] FDA and  has been authorized for detection and/or diagnosis of SARS-CoV-2 by FDA under an Emergency  Use Authorization (EUA). This EUA will remain  in effect (meaning this test can be used) for the duration of the COVID-19 declaration under Section 564(b)(1) of the Act, 21 U.S.C.section 360bbb-3(b)(1), unless the authorization is terminated  or revoked sooner.       Influenza A by PCR NEGATIVE NEGATIVE Final   Influenza B by PCR NEGATIVE NEGATIVE Final    Comment: (NOTE) The Xpert Xpress SARS-CoV-2/FLU/RSV plus assay is intended as an aid in the diagnosis of influenza from Nasopharyngeal swab specimens and should not be used as a sole basis for treatment. Nasal washings and aspirates are unacceptable for Xpert Xpress SARS-CoV-2/FLU/RSV testing.  Fact Sheet for Patients: EntrepreneurPulse.com.au  Fact Sheet for Healthcare Providers: IncredibleEmployment.be  This test is not yet approved or cleared by the [place] FDA and has been authorized for detection and/or diagnosis of SARS-CoV-2 by FDA under an Emergency Use Authorization (EUA). This EUA will remain in effect (meaning this test can be used) for the duration of the COVID-19 declaration under Section 564(b)(1) of the Act, 21 U.S.C. section 360bbb-3(b)(1), unless the authorization is terminated or revoked.  Performed at [facility], [address] [place], [place] [address]      Labs: BNP (last 3 results) Recent Labs    02/23/21 0447 02/25/21 0421 02/28/21 0437  BNP 448.7* 1,024.1* [id number]   Basic Metabolic Panel: Recent Labs  Lab 06/01/21 1450 06/02/21 0624 06/03/21 0626  NA 137 134* 135  K 4.0 4.4 4.6  CL 103 103 107  CO2 27 24 23   GLUCOSE 69* 190* 157*  BUN 17 16 16   CREATININE 1.30* 1.23* 1.46*  CALCIUM 8.1* 7.4* 7.9*  MG 2.0  --   --   PHOS 3.6  --   --    Liver Function Tests: No results for input(s): AST, ALT, ALKPHOS, BILITOT, PROT, ALBUMIN in the last 168 hours. No results for input(s): LIPASE, AMYLASE in the last 168 hours. No results for  input(s): AMMONIA in the last 168 hours. CBC: Recent Labs  Lab 06/01/21 1450 06/02/21 0624  WBC 5.5 3.9*  NEUTROABS 3.4  --   HGB 10.2* 9.2*  HCT 33.3* 28.7*  MCV 99.4 96.6  PLT 208 189   Cardiac Enzymes: No results for input(s): CKTOTAL, CKMB, CKMBINDEX, TROPONINI in the last 168 hours. BNP: Invalid input(s): POCBNP CBG: Recent Labs  Lab 06/03/21 2003 06/04/21 0056 06/04/21 0347 06/04/21 0805 06/04/21 1134  GLUCAP 239* 109* 170* 189* 193*   D-Dimer No results for input(s): DDIMER in the last 72 hours. Hgb A1c No results for input(s): HGBA1C in the last 72 hours. Lipid Profile No results for input(s): CHOL, HDL, LDLCALC, TRIG, CHOLHDL, LDLDIRECT in the last 72 hours. Thyroid function studies No results for input(s): TSH, T4TOTAL, T3FREE, THYROIDAB in the last  72 hours.  Invalid input(s): FREET3 Anemia work up No results for input(s): VITAMINB12, FOLATE, FERRITIN, TIBC, IRON, RETICCTPCT in the last 72 hours. Urinalysis    Component Value Date/Time   COLORURINE YELLOW (A) 06/01/2021 2131   APPEARANCEUR HAZY (A) 06/01/2021 2131   APPEARANCEUR Clear 09/18/2020 1153   LABSPEC 1.016 06/01/2021 2131   LABSPEC 1.026 03/01/2013 1844   PHURINE 5.0 06/01/2021 2131   GLUCOSEU NEGATIVE 06/01/2021 2131   GLUCOSEU >=500 03/01/2013 1844   HGBUR MODERATE (A) 06/01/2021 2131   BILIRUBINUR NEGATIVE 06/01/2021 2131   BILIRUBINUR neg 10/23/2020 0953   BILIRUBINUR Negative 09/18/2020 1153   BILIRUBINUR Negative 03/01/2013 1844   KETONESUR NEGATIVE 06/01/2021 2131   PROTEINUR >=300 (A) 06/01/2021 2131   UROBILINOGEN 0.2 10/23/2020 0953   NITRITE NEGATIVE 06/01/2021 2131   LEUKOCYTESUR SMALL (A) 06/01/2021 2131   LEUKOCYTESUR Negative 03/01/2013 1844   Sepsis Labs Invalid input(s): PROCALCITONIN,  WBC,  LACTICIDVEN Microbiology Recent Results (from the past 240 hour(s))  Resp Panel by RT-PCR (Flu A&B, Covid) Nasopharyngeal Swab     Status: None   Collection Time: 06/01/21   4:25 PM   Specimen: Nasopharyngeal Swab; Nasopharyngeal(NP) swabs in vial transport medium  Result Value Ref Range Status   SARS Coronavirus 2 by RT PCR NEGATIVE NEGATIVE Final    Comment: (NOTE) SARS-CoV-2 target nucleic acids are NOT DETECTED.  The SARS-CoV-2 RNA is generally detectable in upper respiratory specimens during the acute phase of infection. The lowest concentration of SARS-CoV-2 viral copies this assay can detect is 138 copies/mL. A negative result does not preclude SARS-Cov-2 infection and should not be used as the sole basis for treatment or other patient management decisions. A negative result may occur with  improper specimen collection/handling, submission of specimen other than nasopharyngeal swab, presence of viral mutation(s) within the areas targeted by this assay, and inadequate number of viral copies(<138 copies/mL). A negative result must be combined with clinical observations, patient history, and epidemiological information. The expected result is Negative.  Fact Sheet for Patients:  EntrepreneurPulse.com.au  Fact Sheet for Healthcare Providers:  IncredibleEmployment.be  This test is no t yet approved or cleared by the [place] FDA and  has been authorized for detection and/or diagnosis of SARS-CoV-2 by FDA under an Emergency Use Authorization (EUA). This EUA will remain  in effect (meaning this test can be used) for the duration of the COVID-19 declaration under Section 564(b)(1) of the Act, 21 U.S.C.section 360bbb-3(b)(1), unless the authorization is terminated  or revoked sooner.       Influenza A by PCR NEGATIVE NEGATIVE Final   Influenza B by PCR NEGATIVE NEGATIVE Final    Comment: (NOTE) The Xpert Xpress SARS-CoV-2/FLU/RSV plus assay is intended as an aid in the diagnosis of influenza from Nasopharyngeal swab specimens and should not be used as a sole basis for treatment. Nasal washings and aspirates  are unacceptable for Xpert Xpress SARS-CoV-2/FLU/RSV testing.  Fact Sheet for Patients: EntrepreneurPulse.com.au  Fact Sheet for Healthcare Providers: IncredibleEmployment.be  This test is not yet approved or cleared by the [place] FDA and has been authorized for detection and/or diagnosis of SARS-CoV-2 by FDA under an Emergency Use Authorization (EUA). This EUA will remain in effect (meaning this test can be used) for the duration of the COVID-19 declaration under Section 564(b)(1) of the Act, 21 U.S.C. section 360bbb-3(b)(1), unless the authorization is terminated or revoked.  Performed at [facility], [address], [place], [place] [address]      Time  coordinating discharge: Over 30 minutes  SIGNED:   Sidney Ace, MD  [facility] 06/04/2021, 2:42 PM Pager   If 7PM-7AM, please contact night-coverage

## 2021-06-04 NOTE — Progress Notes (Signed)
Contacted by the MD and RN regarding this pt.  Per chart review, looks like the pt's ENDO Dr. Amalia Hailey does not want pt to resume her home insulin pump until she is seen in the ENDO office.  Discussed the above with Dr. Georgeann Oppenheim.  Vernie Shanks, RN with Dr. Darrol Jump office--placed Ashley Camacho on speaker phone so pt and I could both hear her.  Reviewed insulin doses given to pt in the hospital.  Ashley Camacho relayed to [place] that the ENDO office wants Ashley Camacho to take the following when she gets home: Lantus 4 units AM Lantus 4 units PM Hold Lantus if CBG <125  Take Novolog TID with meals per the following Sliding scale (wait 10 minutes after starting meal to administer the Novolog: <150- None 151-200- 1 unit 201-250- 2 units 251-300- 3 units 301-350- 4 units 351-400- 5 units >400- 6 units  Ashley Camacho able to hear the entire conversation and acknowledged the instructions.  Brooke with ENDO office to call pt tomorrow to schedule next appt.  Ashley Camacho knows NOT to resume her home insulin pump and told me she has CBG meter and supplies at home, Lantus and Novolog insulins at home, and other DM supplies.  Relayed all of the above info to Dr. Georgeann Oppenheim and RN caring for pt today.     --Will follow patient during hospitalization--  Ambrose Finland RN, MSN, CDE Diabetes Coordinator Inpatient Glycemic Control Team Team Pager: [phone] (8a-5p)

## 2021-06-14 ENCOUNTER — Encounter: Payer: Self-pay | Admitting: Emergency Medicine

## 2021-06-14 ENCOUNTER — Other Ambulatory Visit: Payer: Self-pay

## 2021-06-14 ENCOUNTER — Emergency Department: Payer: Medicaid Other

## 2021-06-14 ENCOUNTER — Inpatient Hospital Stay
Admission: EM | Admit: 2021-06-14 | Discharge: 2021-06-19 | DRG: 441 | Disposition: A | Discharge status: Home / Self Care | Payer: Medicaid Other | Attending: Internal Medicine | Admitting: Internal Medicine

## 2021-06-14 DIAGNOSIS — E875 Hyperkalemia: Secondary | ICD-10-CM | POA: Diagnosis not present

## 2021-06-14 DIAGNOSIS — K8020 Calculus of gallbladder without cholecystitis without obstruction: Secondary | ICD-10-CM | POA: Diagnosis not present

## 2021-06-14 DIAGNOSIS — T383X6A Underdosing of insulin and oral hypoglycemic [antidiabetic] drugs, initial encounter: Secondary | ICD-10-CM | POA: Diagnosis present

## 2021-06-14 DIAGNOSIS — Z20822 Contact with and (suspected) exposure to covid-19: Secondary | ICD-10-CM | POA: Diagnosis present

## 2021-06-14 DIAGNOSIS — K3184 Gastroparesis: Secondary | ICD-10-CM | POA: Diagnosis present

## 2021-06-14 DIAGNOSIS — Z8719 Personal history of other diseases of the digestive system: Secondary | ICD-10-CM

## 2021-06-14 DIAGNOSIS — N179 Acute kidney failure, unspecified: Secondary | ICD-10-CM | POA: Diagnosis present

## 2021-06-14 DIAGNOSIS — N1831 Chronic kidney disease, stage 3a: Secondary | ICD-10-CM | POA: Diagnosis not present

## 2021-06-14 DIAGNOSIS — E1010 Type 1 diabetes mellitus with ketoacidosis without coma: Secondary | ICD-10-CM | POA: Diagnosis not present

## 2021-06-14 DIAGNOSIS — Z794 Long term (current) use of insulin: Secondary | ICD-10-CM

## 2021-06-14 DIAGNOSIS — E1043 Type 1 diabetes mellitus with diabetic autonomic (poly)neuropathy: Secondary | ICD-10-CM | POA: Diagnosis not present

## 2021-06-14 DIAGNOSIS — R188 Other ascites: Secondary | ICD-10-CM | POA: Diagnosis not present

## 2021-06-14 DIAGNOSIS — E039 Hypothyroidism, unspecified: Secondary | ICD-10-CM | POA: Diagnosis not present

## 2021-06-14 DIAGNOSIS — Z5901 Sheltered homelessness: Secondary | ICD-10-CM

## 2021-06-14 DIAGNOSIS — F419 Anxiety disorder, unspecified: Secondary | ICD-10-CM | POA: Diagnosis present

## 2021-06-14 DIAGNOSIS — E10649 Type 1 diabetes mellitus with hypoglycemia without coma: Secondary | ICD-10-CM | POA: Diagnosis not present

## 2021-06-14 DIAGNOSIS — Z79890 Hormone replacement therapy (postmenopausal): Secondary | ICD-10-CM

## 2021-06-14 DIAGNOSIS — F323 Major depressive disorder, single episode, severe with psychotic features: Secondary | ICD-10-CM | POA: Diagnosis not present

## 2021-06-14 DIAGNOSIS — I5031 Acute diastolic (congestive) heart failure: Secondary | ICD-10-CM

## 2021-06-14 DIAGNOSIS — R7401 Elevation of levels of liver transaminase levels: Secondary | ICD-10-CM | POA: Diagnosis present

## 2021-06-14 DIAGNOSIS — E119 Type 2 diabetes mellitus without complications: Secondary | ICD-10-CM | POA: Diagnosis not present

## 2021-06-14 DIAGNOSIS — D696 Thrombocytopenia, unspecified: Secondary | ICD-10-CM | POA: Diagnosis not present

## 2021-06-14 DIAGNOSIS — E1042 Type 1 diabetes mellitus with diabetic polyneuropathy: Secondary | ICD-10-CM | POA: Diagnosis not present

## 2021-06-14 DIAGNOSIS — E2740 Unspecified adrenocortical insufficiency: Secondary | ICD-10-CM | POA: Diagnosis not present

## 2021-06-14 DIAGNOSIS — Z91138 Patient's unintentional underdosing of medication regimen for other reason: Secondary | ICD-10-CM

## 2021-06-14 DIAGNOSIS — N183 Chronic kidney disease, stage 3 unspecified: Secondary | ICD-10-CM | POA: Diagnosis present

## 2021-06-14 DIAGNOSIS — Z833 Family history of diabetes mellitus: Secondary | ICD-10-CM

## 2021-06-14 DIAGNOSIS — I1310 Hypertensive heart and chronic kidney disease without heart failure, with stage 1 through stage 4 chronic kidney disease, or unspecified chronic kidney disease: Secondary | ICD-10-CM | POA: Diagnosis not present

## 2021-06-14 DIAGNOSIS — E1143 Type 2 diabetes mellitus with diabetic autonomic (poly)neuropathy: Secondary | ICD-10-CM | POA: Diagnosis not present

## 2021-06-14 DIAGNOSIS — E160 Drug-induced hypoglycemia without coma: Secondary | ICD-10-CM | POA: Insufficient documentation

## 2021-06-14 DIAGNOSIS — M7989 Other specified soft tissue disorders: Secondary | ICD-10-CM | POA: Diagnosis present

## 2021-06-14 DIAGNOSIS — R1011 Right upper quadrant pain: Secondary | ICD-10-CM | POA: Diagnosis not present

## 2021-06-14 DIAGNOSIS — I340 Nonrheumatic mitral (valve) insufficiency: Secondary | ICD-10-CM | POA: Diagnosis present

## 2021-06-14 DIAGNOSIS — R748 Abnormal levels of other serum enzymes: Secondary | ICD-10-CM | POA: Diagnosis not present

## 2021-06-14 DIAGNOSIS — S301XXA Contusion of abdominal wall, initial encounter: Secondary | ICD-10-CM | POA: Diagnosis not present

## 2021-06-14 DIAGNOSIS — D631 Anemia in chronic kidney disease: Secondary | ICD-10-CM | POA: Diagnosis not present

## 2021-06-14 DIAGNOSIS — Z8249 Family history of ischemic heart disease and other diseases of the circulatory system: Secondary | ICD-10-CM

## 2021-06-14 DIAGNOSIS — E1110 Type 2 diabetes mellitus with ketoacidosis without coma: Secondary | ICD-10-CM | POA: Diagnosis present

## 2021-06-14 DIAGNOSIS — K7200 Acute and subacute hepatic failure without coma: Principal | ICD-10-CM | POA: Diagnosis present

## 2021-06-14 DIAGNOSIS — T383X5A Adverse effect of insulin and oral hypoglycemic [antidiabetic] drugs, initial encounter: Secondary | ICD-10-CM | POA: Insufficient documentation

## 2021-06-14 DIAGNOSIS — I371 Nonrheumatic pulmonary valve insufficiency: Secondary | ICD-10-CM | POA: Diagnosis not present

## 2021-06-14 DIAGNOSIS — R112 Nausea with vomiting, unspecified: Secondary | ICD-10-CM

## 2021-06-14 DIAGNOSIS — F32A Depression, unspecified: Secondary | ICD-10-CM | POA: Diagnosis present

## 2021-06-14 DIAGNOSIS — J90 Pleural effusion, not elsewhere classified: Secondary | ICD-10-CM | POA: Diagnosis not present

## 2021-06-14 DIAGNOSIS — E1022 Type 1 diabetes mellitus with diabetic chronic kidney disease: Secondary | ICD-10-CM | POA: Diagnosis present

## 2021-06-14 DIAGNOSIS — Z803 Family history of malignant neoplasm of breast: Secondary | ICD-10-CM

## 2021-06-14 DIAGNOSIS — Z8701 Personal history of pneumonia (recurrent): Secondary | ICD-10-CM

## 2021-06-14 LAB — SALICYLATE LEVEL: Salicylate Lvl: <7 mg/dL — ABNORMAL LOW (ref 7.0–30.0)

## 2021-06-14 LAB — COMPREHENSIVE METABOLIC PANEL
ALT: 554 U/L — ABNORMAL HIGH (ref 0–44)
AST: 1648 U/L — ABNORMAL HIGH (ref 15–41)
Albumin: 3.5 g/dL (ref 3.5–5.0)
Alkaline Phosphatase: 202 U/L — ABNORMAL HIGH (ref 38–126)
Anion gap: 20 — ABNORMAL HIGH (ref 5–15)
BUN: 27 mg/dL — ABNORMAL HIGH (ref 6–20)
CO2: 13 mmol/L — ABNORMAL LOW (ref 22–32)
Calcium: 8.5 mg/dL — ABNORMAL LOW (ref 8.9–10.3)
Chloride: 108 mmol/L (ref 98–111)
Creatinine, Ser: 1.8 mg/dL — ABNORMAL HIGH (ref 0.44–1.00)
GFR, Estimated: 38 mL/min — ABNORMAL LOW (ref >60)
Glucose, Bld: 187 mg/dL — ABNORMAL HIGH (ref 70–99)
Potassium: 5.2 mmol/L — ABNORMAL HIGH (ref 3.5–5.1)
Sodium: 141 mmol/L (ref 135–145)
Total Bilirubin: 1.7 mg/dL — ABNORMAL HIGH (ref 0.3–1.2)
Total Protein: 6.6 g/dL (ref 6.5–8.1)

## 2021-06-14 LAB — URINE DRUG SCREEN, QUALITATIVE (ARMC ONLY)
Amphetamines, Ur Screen: NOT DETECTED
Barbiturates, Ur Screen: NOT DETECTED
Benzodiazepine, Ur Scrn: NOT DETECTED
Cannabinoid 50 Ng, Ur ~~LOC~~: NOT DETECTED
Cocaine Metabolite,Ur ~~LOC~~: NOT DETECTED
MDMA (Ecstasy)Ur Screen: NOT DETECTED
Methadone Scn, Ur: NOT DETECTED
Opiate, Ur Screen: NOT DETECTED
Phencyclidine (PCP) Ur S: NOT DETECTED
Tricyclic, Ur Screen: NOT DETECTED

## 2021-06-14 LAB — BLOOD GAS, VENOUS
Acid-Base Excess: 2.3 mmol/L — ABNORMAL HIGH (ref 0.0–2.0)
Acid-base deficit: 5.1 mmol/L — ABNORMAL HIGH (ref 0.0–2.0)
Bicarbonate: 19.9 mmol/L — ABNORMAL LOW (ref 20.0–28.0)
Bicarbonate: 26.4 mmol/L (ref 20.0–28.0)
O2 Saturation: 74.5 %
O2 Saturation: 94.6 %
Patient temperature: 37
Patient temperature: 37
pCO2, Ven: 36 mmHg — ABNORMAL LOW (ref 44.0–60.0)
pCO2, Ven: 38 mmHg — ABNORMAL LOW (ref 44.0–60.0)
pH, Ven: 7.35 (ref 7.250–7.430)
pH, Ven: 7.45 — ABNORMAL HIGH (ref 7.250–7.430)
pO2, Ven: 42 mmHg (ref 32.0–45.0)
pO2, Ven: 70 mmHg — ABNORMAL HIGH (ref 32.0–45.0)

## 2021-06-14 LAB — BETA-HYDROXYBUTYRIC ACID
Beta-Hydroxybutyric Acid: 4.94 mmol/L — ABNORMAL HIGH (ref 0.05–0.27)
Beta-Hydroxybutyric Acid: 3.85 mmol/L — ABNORMAL HIGH (ref 0.05–0.27)

## 2021-06-14 LAB — CBC
HCT: 38.6 % (ref 36.0–46.0)
Hemoglobin: 11.6 g/dL — ABNORMAL LOW (ref 12.0–15.0)
MCH: 30.1 pg (ref 26.0–34.0)
MCHC: 30.1 g/dL (ref 30.0–36.0)
MCV: 100 fL (ref 80.0–100.0)
Platelets: 114 10*3/uL — ABNORMAL LOW (ref 150–400)
RBC: 3.86 MIL/uL — ABNORMAL LOW (ref 3.87–5.11)
RDW: 14.7 % (ref 11.5–15.5)
WBC: 8 10*3/uL (ref 4.0–10.5)
nRBC: 0 % (ref 0.0–0.2)

## 2021-06-14 LAB — GLUCOSE, CAPILLARY
Glucose-Capillary: 187 mg/dL — ABNORMAL HIGH (ref 70–99)
Glucose-Capillary: 189 mg/dL — ABNORMAL HIGH (ref 70–99)
Glucose-Capillary: 166 mg/dL — ABNORMAL HIGH (ref 70–99)
Glucose-Capillary: 140 mg/dL — ABNORMAL HIGH (ref 70–99)

## 2021-06-14 LAB — HCG, QUANTITATIVE, PREGNANCY: hCG, Beta Chain, Quant, S: 2 m[IU]/mL (ref <5)

## 2021-06-14 LAB — RESP PANEL BY RT-PCR (FLU A&B, COVID) ARPGX2
Influenza A by PCR: NEGATIVE
Influenza B by PCR: NEGATIVE
SARS Coronavirus 2 by RT PCR: NEGATIVE

## 2021-06-14 LAB — MAGNESIUM: Magnesium: 2.3 mg/dL (ref 1.7–2.4)

## 2021-06-14 LAB — LIPASE, BLOOD: Lipase: 25 U/L (ref 11–51)

## 2021-06-14 LAB — PROTIME-INR
INR: 2.2 — ABNORMAL HIGH (ref 0.8–1.2)
Prothrombin Time: 24.8 seconds — ABNORMAL HIGH (ref 11.4–15.2)

## 2021-06-14 LAB — ACETAMINOPHEN LEVEL: Acetaminophen (Tylenol), Serum: <10 ug/mL — ABNORMAL LOW (ref 10–30)

## 2021-06-14 LAB — CBG MONITORING, ED
Glucose-Capillary: 183 mg/dL — ABNORMAL HIGH (ref 70–99)
Glucose-Capillary: 198 mg/dL — ABNORMAL HIGH (ref 70–99)
Glucose-Capillary: 209 mg/dL — ABNORMAL HIGH (ref 70–99)

## 2021-06-14 LAB — ETHANOL: Alcohol, Ethyl (B): <10 mg/dL (ref <10)

## 2021-06-14 LAB — LACTIC ACID, PLASMA: Lactic Acid, Venous: 2.4 mmol/L (ref 0.5–1.9)

## 2021-06-14 LAB — MRSA NEXT GEN BY PCR, NASAL: MRSA by PCR Next Gen: NOT DETECTED

## 2021-06-14 LAB — CK: Total CK: 218 U/L (ref 38–234)

## 2021-06-14 LAB — CORTISOL: Cortisol, Plasma: 16.4 ug/dL

## 2021-06-14 MED ORDER — LACTATED RINGERS IV SOLN: INTRAVENOUS | Status: AC

## 2021-06-14 MED ORDER — ONDANSETRON HCL 4 MG/2ML IJ SOLN
4.0000 mg | INTRAMUSCULAR | Status: AC
  Administered 2021-06-14: 4 mg via INTRAVENOUS
  Filled 2021-06-14: qty 2

## 2021-06-14 MED ORDER — DEXTROSE 50 % IV SOLN
0.0000 mL | INTRAVENOUS | Status: DC | PRN
  Administered 2021-06-18: 06:00:00 50 mL via INTRAVENOUS
  Filled 2021-06-14: qty 50

## 2021-06-14 MED ORDER — SODIUM CHLORIDE 0.9 % IV BOLUS
500.0000 mL | Freq: Once | INTRAVENOUS | Status: AC
  Administered 2021-06-14: 500 mL via INTRAVENOUS

## 2021-06-14 MED ORDER — LACTATED RINGERS IV BOLUS: 20.0000 mL/kg | Freq: Once | INTRAVENOUS | Status: DC

## 2021-06-14 MED ORDER — HYDROCORTISONE SOD SUC (PF) 100 MG IJ SOLR
100.0000 mg | Freq: Two times a day (BID) | INTRAMUSCULAR | Status: DC
  Administered 2021-06-14 – 2021-06-16 (×4): 100 mg via INTRAVENOUS
  Filled 2021-06-14 (×5): qty 2

## 2021-06-14 MED ORDER — METOCLOPRAMIDE HCL 5 MG/ML IJ SOLN
10.0000 mg | Freq: Two times a day (BID) | INTRAMUSCULAR | Status: AC
  Administered 2021-06-14 (×2): 10 mg via INTRAVENOUS
  Filled 2021-06-14 (×2): qty 2

## 2021-06-14 MED ORDER — IOHEXOL 300 MG/ML  SOLN
80.0000 mL | Freq: Once | INTRAMUSCULAR | Status: AC | PRN
  Administered 2021-06-14: 80 mL via INTRAVENOUS

## 2021-06-14 MED ORDER — DEXTROSE IN LACTATED RINGERS 5 % IV SOLN
INTRAVENOUS | Status: DC
  Administered 2021-06-15: 125 mL/h via INTRAVENOUS

## 2021-06-14 MED ORDER — ACETAMINOPHEN 500 MG PO TABS: 1000.0000 mg | ORAL_TABLET | Freq: Four times a day (QID) | ORAL | Status: DC | PRN

## 2021-06-14 MED ORDER — INSULIN REGULAR(HUMAN) IN NACL 100-0.9 UT/100ML-% IV SOLN
INTRAVENOUS | Status: DC
  Administered 2021-06-14: 2.8 [IU]/h via INTRAVENOUS
  Filled 2021-06-14: qty 100

## 2021-06-14 MED ORDER — FUROSEMIDE 10 MG/ML IJ SOLN
40.0000 mg | Freq: Every day | INTRAMUSCULAR | Status: DC
  Administered 2021-06-14 – 2021-06-15 (×2): 40 mg via INTRAVENOUS
  Filled 2021-06-14 (×2): qty 4

## 2021-06-14 MED ORDER — HYDROCORTISONE SOD SUC (PF) 100 MG IJ SOLR
100.0000 mg | INTRAMUSCULAR | Status: AC
  Administered 2021-06-14: 100 mg via INTRAVENOUS
  Filled 2021-06-14: qty 2

## 2021-06-14 MED ORDER — CHLORHEXIDINE GLUCONATE CLOTH 2 % EX PADS
6.0000 | MEDICATED_PAD | Freq: Every day | CUTANEOUS | Status: DC
  Administered 2021-06-15 – 2021-06-17 (×3): 6 via TOPICAL

## 2021-06-14 MED ORDER — SODIUM CHLORIDE 0.9 % IV BOLUS
1000.0000 mL | Freq: Once | INTRAVENOUS | Status: AC
  Administered 2021-06-14: 1000 mL via INTRAVENOUS

## 2021-06-14 MED ORDER — LEVOTHYROXINE SODIUM 25 MCG PO TABS
25.0000 ug | ORAL_TABLET | Freq: Every day | ORAL | Status: DC
  Administered 2021-06-15 – 2021-06-19 (×5): 25 ug via ORAL
  Filled 2021-06-14 (×5): qty 1

## 2021-06-14 MED ORDER — VITAMIN K1 10 MG/ML IJ SOLN
10.0000 mg | Freq: Once | INTRAVENOUS | Status: AC
  Administered 2021-06-14: 10 mg via INTRAVENOUS
  Filled 2021-06-14: qty 1

## 2021-06-14 NOTE — H&P (Addendum)
History and Physical    Ashley Camacho [id number] DOB: 1990-09-22 DOA: 06/14/2021  PCP: Pcp, No   Patient coming from: Home  I have personally briefly reviewed patient's old medical records in [place]  Chief Complaint: Nausea/Vomiting  HPI: Ashley Camacho is a 31 y.o. female with medical history significant for insulin-dependent diabetes mellitus with complications of stage III chronic kidney disease and diabetic gastroparesis, history of adrenal insufficiency, history of CHF who presents to the emergency room for evaluation of a 2-day history of nausea, vomiting and inability to tolerate any oral intake.  She has not used her insulin in 2 days.  She has bilateral lower extremity swelling. She denies having any abdominal pain, no diarrhea, no headache, no fever, no chills, no palpitations, no diaphoresis, no chest pain, no cough, no focal deficit or blurred vision, no changes in her bowel habits or urinary symptoms. She denies alcohol use and denies ingestion of Tylenol. ABG 7.35/36/42/19.9/74.5 Sodium 141, potassium 5.2, chloride 108, bicarb 13, glucose 187, BUN 27, creatinine 1.80, calcium 8.5, anion gap 20, alkaline phosphatase 202, albumin 3.5, lipase 25, AST 1648, ALT 554, total protein 6.6, total bilirubin 1.7, total CK2 18, lactic acid 2.4, white count 8.0, hemoglobin 11.6, hematocrit 38.6, MCV 100, RDW 14.7, platelet count 114, PT 24.8, INR 2.2, beta hydroxybutyric acid 4.94, Urine pregnancy test is negative Respiratory viral panel is negative CT scan of abdomen and pelvis shows marked diffuse body wall edema compatible with anasarca.  Increase in abdominal pelvic ascites.  Bilateral pleural effusions, right greater than left.  Gallstones with mild gallbladder edema.  Findings are nonspecific in the setting of ascites. Gallbladder ultrasound shows cholelithiasis without biliary dilatation.  Ascites.  Right pleural effusion   ED Course: Patient is a 31 year old female  who presents to the ER for evaluation of a 2-day history of nausea, vomiting and inability to tolerate oral intake. Labs show transaminitis and anion gap metabolic acidosis. Imaging shows evidence of anasarca. She received a dose of IV hydrocortisone in the ER and will be admitted to the hospital for further evaluation. She will be admitted to the hospital for further evaluation.   Review of Systems: As per HPI otherwise all other systems reviewed and negative.    Past Medical History:  Diagnosis Date   Adrenal insufficiency ([place])    Anemia    Gastroparesis    Hypertension    Type 1 diabetes ([place])     Past Surgical History:  Procedure Laterality Date   BIOPSY  01/14/2021   Procedure: BIOPSY;  Surgeon: Thornton Park, MD;  Location: [facility];  Service: Gastroenterology;;   CESAREAN SECTION     CESAREAN SECTION WITH BILATERAL TUBAL LIGATION N/A 02/17/2021   Procedure: CESAREAN SECTION WITH BILATERAL TUBAL LIGATION;  Surgeon: Truett Mainland, DO;  Location: [facility];  Service: Obstetrics;  Laterality: N/A;   ESOPHAGOGASTRODUODENOSCOPY (EGD) WITH PROPOFOL N/A 01/14/2021   Procedure: ESOPHAGOGASTRODUODENOSCOPY (EGD) WITH PROPOFOL;  Surgeon: Thornton Park, MD;  Location: [facility];  Service: Gastroenterology;  Laterality: N/A;   MOUTH SURGERY       reports that she has never smoked. She has never used smokeless tobacco. She reports that she does not drink alcohol and does not use drugs.  No Known Allergies  Family History  Problem Relation Age of Onset   Diabetes type I Father    CAD Father    CAD Paternal Grandmother    CAD Paternal Grandfather    Breast cancer Mother  Ovarian cancer Neg Hx       Prior to Admission medications   Medication Sig Start Date End Date Taking? Authorizing Provider  acetaminophen (TYLENOL) 500 MG tablet Take 2 tablets (1,000 mg total) by mouth every 6 (six) hours as needed for headache, fever or moderate pain. 03/04/21   Constant,  Peggy, MD  hydrocortisone (CORTEF) 10 MG tablet Take 1 tablet (10 mg total) by mouth once every morning. 06/04/21   Sidney Ace, MD  hydrocortisone (CORTEF) 5 MG tablet Take 1 tablet (5 mg total) by mouth once daily at 4PM. 06/04/21   Sreenath, Louanne Belton B, MD  insulin aspart (NOVOLOG) 100 UNIT/ML FlexPen Inject 2 Units into the skin 3 (three) times daily with meals Inject 0-6 Units into the skin every 4 (four) hours. 03/04/21   Constant, Peggy, MD  levothyroxine (SYNTHROID) 25 MCG tablet Take 1 tablet (25 mcg total) by mouth once daily at 6 (six) AM. Patient not taking: Reported on 06/02/2021 05/25/21   Hosie Poisson, MD  metoCLOPramide (REGLAN) 10 MG tablet Take 1 tablet (10 mg total) by mouth 3 (three) times daily with meals. 06/04/21 07/04/21  Sidney Ace, MD  Multiple Vitamin (MULTIVITAMIN WITH MINERALS) TABS tablet Take 1 tablet by mouth daily. Patient not taking: Reported on 05/20/2021 05/11/21   Ezekiel Slocumb, DO    Physical Exam: Vitals:   06/14/21 [id number] 06/14/21 0943 06/14/21 1100 06/14/21 1300  BP: (!) 127/98  (!) 126/98 (!) 124/96  Pulse: (!) 103  (!) 101 96  Resp: 18  18 17   Temp: 97.7 F (36.5 C)     TempSrc: Oral     SpO2: 100%  98% 97%  Weight:  52.2 kg    Height:  5\' 1"  (1.549 m)       Vitals:   06/14/21 0942 06/14/21 0943 06/14/21 1100 06/14/21 1300  BP: (!) 127/98  (!) 126/98 (!) 124/96  Pulse: (!) 103  (!) 101 96  Resp: 18  18 17   Temp: 97.7 F (36.5 C)     TempSrc: Oral     SpO2: 100%  98% 97%  Weight:  52.2 kg    Height:  5\' 1"  (1.549 m)        Constitutional: Alert and oriented x 3 . Not in any apparent distress HEENT:      Head: Normocephalic and atraumatic.         Eyes: PERLA, EOMI, Conjunctivae are normal. Sclera is non-icteric.       Mouth/Throat: Mucous membranes are dry       Neck: Supple with no signs of meningismus. Cardiovascular: Tachycardic. No murmurs, gallops, or rubs. 2+ symmetrical distal pulses are present . No JVD. 2+  LE edema Respiratory: Respiratory effort normal .crackles in both lung bases bilaterally  No wheezes or rhonchi. Gastrointestinal: Soft, non tender, and non distended with positive bowel sounds.  Genitourinary: No CVA tenderness. Musculoskeletal: Nontender with normal range of motion in all extremities. No cyanosis, or erythema of extremities. Neurologic:  Face is symmetric. Moving all extremities. No gross focal neurologic deficits . Skin: Skin is warm, dry.  No rash or ulcers Psychiatric: Mood and affect are normal    Labs on Admission: I have personally reviewed following labs and imaging studies  CBC: Recent Labs  Lab 06/14/21 1003  WBC 8.0  HGB 11.6*  HCT 38.6  MCV 100.0  PLT [id number]*   Basic Metabolic Panel: Recent Labs  Lab 06/14/21 1003 06/14/21 1004  NA 141  --  K 5.2*  --   CL 108  --   CO2 13*  --   GLUCOSE 187*  --   BUN 27*  --   CREATININE 1.80*  --   CALCIUM 8.5*  --   MG  --  2.3   GFR: Estimated Creatinine Clearance: 34.5 mL/min (A) (by C-G formula based on SCr of 1.8 mg/dL (H)). Liver Function Tests: Recent Labs  Lab 06/14/21 1003  AST 1,648*  ALT 554*  ALKPHOS 202*  BILITOT 1.7*  PROT 6.6  ALBUMIN 3.5   Recent Labs  Lab 06/14/21 1003  LIPASE 25   No results for input(s): AMMONIA in the last 168 hours. Coagulation Profile: Recent Labs  Lab 06/14/21 1202  INR 2.2*   Cardiac Enzymes: Recent Labs  Lab 06/14/21 1202  CKTOTAL 218   BNP (last 3 results) No results for input(s): PROBNP in the last 8760 hours. HbA1C: No results for input(s): HGBA1C in the last 72 hours. CBG: Recent Labs  Lab 06/14/21 1025  GLUCAP 183*   Lipid Profile: No results for input(s): CHOL, HDL, LDLCALC, TRIG, CHOLHDL, LDLDIRECT in the last 72 hours. Thyroid Function Tests: No results for input(s): TSH, T4TOTAL, FREET4, T3FREE, THYROIDAB in the last 72 hours. Anemia Panel: No results for input(s): VITAMINB12, FOLATE, FERRITIN, TIBC, IRON, RETICCTPCT in  the last 72 hours. Urine analysis:    Component Value Date/Time   COLORURINE YELLOW (A) 06/01/2021 2131   APPEARANCEUR HAZY (A) 06/01/2021 2131   APPEARANCEUR Clear 09/18/2020 1153   LABSPEC 1.016 06/01/2021 2131   LABSPEC 1.026 03/01/2013 1844   PHURINE 5.0 06/01/2021 2131   GLUCOSEU NEGATIVE 06/01/2021 2131   GLUCOSEU >=500 03/01/2013 1844   HGBUR MODERATE (A) 06/01/2021 2131   BILIRUBINUR NEGATIVE 06/01/2021 2131   BILIRUBINUR neg 10/23/2020 0953   BILIRUBINUR Negative 09/18/2020 1153   BILIRUBINUR Negative 03/01/2013 1844   KETONESUR NEGATIVE 06/01/2021 2131   PROTEINUR >=300 (A) 06/01/2021 2131   UROBILINOGEN 0.2 10/23/2020 0953   NITRITE NEGATIVE 06/01/2021 2131   LEUKOCYTESUR SMALL (A) 06/01/2021 2131   LEUKOCYTESUR Negative 03/01/2013 1844    Radiological Exams on Admission: CT ABDOMEN PELVIS WO CONTRAST  Result Date: 06/14/2021 CLINICAL DATA:  Diabetic presenting from home with vomiting for 2-3 days. EXAM: CT ABDOMEN AND PELVIS WITHOUT CONTRAST TECHNIQUE: Multidetector CT imaging of the abdomen and pelvis was performed following the standard protocol without IV contrast. RADIATION DOSE REDUCTION: This exam was performed according to the departmental dose-optimization program which includes automated exposure control, adjustment of the mA and/or kV according to patient size and/or use of iterative reconstruction technique. COMPARISON:  02/23/2021 FINDINGS: Lower chest: Moderate to large right pleural effusion. Small to moderate left pleural effusion noted. Hepatobiliary: No focal liver abnormality. There is mild diffuse gallbladder wall edema. Stones are noted layering within the dependent portion of the gallbladder. No signs of bile duct dilatation. Pancreas: Unremarkable. No pancreatic ductal dilatation or surrounding inflammatory changes. Spleen: Normal in size without focal abnormality. Adrenals/Urinary Tract: Normal appearance of the adrenal glands. The left kidney is  unremarkable. Right-sided pelvocaliectasis is identified. No urinary tract calculi identified bilaterally. Bladder appears normal. Stomach/Bowel: Stomach appears normal. The appendix is visualized and is within normal limits. No bowel wall thickening, inflammation or distension identified. Vascular/Lymphatic: No significant vascular findings are present. No enlarged abdominal or pelvic lymph nodes. Reproductive: Uterus and bilateral adnexa are unremarkable. Other: Interval increase in abdominopelvic ascites. Musculoskeletal: Previously noted ventral abdominal wall fluid collection/hematoma has decreased in size compared with 02/23/2021.  On today's study this measures 5.2 x 2.3 by 5.9 cm. Previously this measured 11.8 by 11.0 by 14.4 cm. There is marked diffuse body wall edema compatible with anasarca. 2 no acute or suspicious osseous findings. IMPRESSION: 1. Marked diffuse body wall edema compatible with anasarca. 2. Interval increase in abdominopelvic ascites. 3. Decrease in size of ventral abdominal wall fluid collection/hematoma. 4. Bilateral pleural effusions, right greater than left. 5. Gallstones with mild gallbladder wall edema. Findings are nonspecific in the setting of ascites. If there is a clinical concern for acute cholecystitis consider further evaluation with right upper quadrant sonogram. 6. Right-sided pelvocaliectasis. No urinary tract calculi identified. Electronically Signed   By: Kerby Moors M.D.   On: 06/14/2021 14:37   US ABDOMEN LIMITED RUQ (LIVER/GB)  Result Date: 06/14/2021 CLINICAL DATA:  Right upper quadrant pain 3 days.  Transaminitis. EXAM: ULTRASOUND ABDOMEN LIMITED RIGHT UPPER QUADRANT COMPARISON:  CT abdomen pelvis 02/23/2021 FINDINGS: Gallbladder: Multiple gallstones up to 8 mm in diameter. Negative sonographic Murphy sign. Fluid around the gallbladder may be pericholecystic fluid versus ascites. Common bile duct: Diameter: 3 mm Liver: No focal lesion identified. Within normal  limits in parenchymal echogenicity. Portal vein is patent on color Doppler imaging with normal direction of blood flow towards the liver. Other: Small amount of ascites Right pleural effusion IMPRESSION: Cholelithiasis without   biliary dilatation Ascites Right pleural effusion Electronically Signed   By: Franchot Gallo M.D.   On: 06/14/2021 13:25     Assessment/Plan Principal Problem:   DKA (diabetic ketoacidosis) ([place]) Active Problems:   Diabetic gastroparesis ([place])   Adrenal insufficiency ([place])   CKD stage 3 due to type 1 diabetes mellitus ([facility])   Acute diastolic CHF (congestive heart failure) ([place])   Transaminitis     Patient is a 31 year old female admitted to the hospital for DKA and acute diastolic dysfunction CHF.    DKA In a patient with type 1 diabetes mellitus who presents for evaluation of nausea and vomiting and has skipped her insulin due to poor oral intake. Patient appears to be euglycemic Hydrate patient with dextrose containing fluids Place patient on insulin protocol Check serial electrolytes and beta hydroxybutyric acid levels Keep N.p.o. for now IV Reglan Diabetic education     Acute diastolic dysfunction CHF Last known LVEF of 50 to 55% Patient is noted to have anasarca as well as bilateral pleural effusions. Place patient on Lasix 40 mg IV daily Patient is not on an ACE inhibitor due to renal insufficiency and not on a beta-blocker and she is normotensive.     Transaminitis Unclear etiology ??  Hepatic congestion from anasarca Obtain acute hepatitis panel We will consult GI    Adrenal insufficiency Will place patient on IV hydrocortisone 100 mg every 12 since she is unable to take p.o. at this time    Stage III chronic kidney disease secondary to diabetes mellitus Renal function is stable Monitor closely during this hospitalization  DVT prophylaxis: SCD  Code Status: full code  Family Communication: Greater than 50% of time was spent  discussing patient's condition and plan of care with her at the bedside.  All questions and concerns have been addressed.  She verbalizes understanding and agrees with the plan. Disposition Plan: Back to previous home environment Consults called: Gastroenterology Status:At the time of admission, it appears that the appropriate admission status for this patient is inpatient. This is judged to be reasonable and necessary to provide the required intensity of service to ensure the patient's  safety given the presenting symptoms, physical exam findings, and initial radiographic and laboratory data in the context of their comorbid conditions. Patient requires inpatient status due to high intensity of service, high risk for further deterioration and high frequency of surveillance required.     Collier Bullock MD [facility]     06/14/2021, 3:30 PM

## 2021-06-14 NOTE — Progress Notes (Signed)
1800 Patient admitted from ED via stretcher.

## 2021-06-14 NOTE — ED Notes (Signed)
Phlebotomy called to get labs.  RN unable to obtain

## 2021-06-14 NOTE — ED Notes (Signed)
IV access attempted by this RN x 1. Unsuccessful IV attempt.

## 2021-06-14 NOTE — ED Provider Notes (Signed)
[facility] Provider Note    Event Date/Time   First MD Initiated Contact with Patient 06/14/21 1003     (approximate)   History   Emesis   HPI  Ashley Camacho is a 31 y.o. female type 1 diabetes, depression/anxiety, hypothyroidism, adrenal insufficiency, type I polyneuropathy, chronic anemia  She had recent hospitalization which I reviewed in January where she was admitted and evaluated for hypoglycemic episodes and adrenal insufficiency  She reports for about the last 3 days she has had nausea and vomiting.  She reports this occurs frequently.  She will get nauseated and started vomiting.  She has no pain or anything but she will be able to keep fluids down just vomits yellow emesis and cannot keep food or water on her stomach  No fevers or chills.  She reports compliance with her insulin her blood sugars have averaged about 130s when she has been checking them.  She denies any low glucoses.  She also notes that she ran out of her oral steroid or hydrocortisone and she has not had that since she left the hospital because she cannot afford it  She feels very fatigued weak and generally tired worsening over the last several days.      Physical Exam   Triage Vital Signs: ED Triage Vitals  Enc Vitals Group     BP 06/14/21 0942 (!) 127/98     Pulse Rate 06/14/21 0942 (!) 103     Resp 06/14/21 0942 18     Temp 06/14/21 0942 97.7 F (36.5 C)     Temp Source 06/14/21 0942 Oral     SpO2 06/14/21 0942 100 %     Weight 06/14/21 0943 115 lb (52.2 kg)     Height 06/14/21 0943  (1.549 m)     Head Circumference --      Peak Flow --      Pain Score 06/14/21 0943 0     Pain Loc --      Pain Edu? --      Excl. in GC? --     Most recent vital signs: Vitals:   06/14/21 1100 06/14/21 1300  BP: (!) 126/98 (!) 124/96  Pulse: (!) 101 96  Resp: 18 17  Temp:    SpO2: 98% 97%     General: Awake, no distress.  She appears fatigued, no acute distress  though.  No alteration of mental status. Mucous membranes are dry. CV:  Good peripheral perfusion.  She has a normal S1 but a somewhat divided S2.  She seems to have a slight murmur with S2. Resp:  Normal effort.  Clear lung sounds no distress denies any shortness of breath Abd:  No distention.  No pain throughout on abdominal exam.  Negative Murphy.  No pain in any quadrant.  No rebound or guarding.  Patient reports nausea but denies abdominal pain She does not believe she is pregnant Other:  Skin appears slightly pale in nature.  No jaundice.  She feels somewhat fatigued in all extremities including 4+ strength in all extremities.  No hyperreflexia   ED Results / Procedures / Treatments   Labs (all labs ordered are listed, but only abnormal results are displayed) Labs Reviewed  COMPREHENSIVE METABOLIC PANEL - Abnormal; Notable for the following components:      Result Value   Potassium 5.2 (*)    CO2 13 (*)    Glucose, Bld 187 (*)    BUN 27 (*)  Creatinine, Ser 1.80 (*)    Calcium 8.5 (*)    AST 1,648 (*)    ALT 554 (*)    Alkaline Phosphatase 202 (*)    Total Bilirubin 1.7 (*)    GFR, Estimated 38 (*)    Anion gap 20 (*)    All other components within normal limits  CBC - Abnormal; Notable for the following components:   RBC 3.86 (*)    Hemoglobin 11.6 (*)    Platelets 114 (*)    All other components within normal limits  BETA-HYDROXYBUTYRIC ACID - Abnormal; Notable for the following components:   Beta-Hydroxybutyric Acid 4.94 (*)    All other components within normal limits  BLOOD GAS, VENOUS - Abnormal; Notable for the following components:   pCO2, Ven 36 (*)    Bicarbonate 19.9 (*)    Acid-base deficit 5.1 (*)    All other components within normal limits  LACTIC ACID, PLASMA - Abnormal; Notable for the following components:   Lactic Acid, Venous 2.4 (*)    All other components within normal limits  ACETAMINOPHEN LEVEL - Abnormal; Notable for the following  components:   Acetaminophen (Tylenol), Serum <10 (*)    All other components within normal limits  PROTIME-INR - Abnormal; Notable for the following components:   Prothrombin Time 24.8 (*)    INR 2.2 (*)    All other components within normal limits  CBG MONITORING, ED - Abnormal; Notable for the following components:   Glucose-Capillary 183 (*)    All other components within normal limits  RESP PANEL BY RT-PCR (FLU A&B, COVID) ARPGX2  LIPASE, BLOOD  CORTISOL  MAGNESIUM  CK  HCG, QUANTITATIVE, PREGNANCY  URINALYSIS, ROUTINE W REFLEX MICROSCOPIC  ACTH  BASIC METABOLIC PANEL  BASIC METABOLIC PANEL  BASIC METABOLIC PANEL  BASIC METABOLIC PANEL  BETA-HYDROXYBUTYRIC ACID  BETA-HYDROXYBUTYRIC ACID  SALICYLATE LEVEL  POC URINE PREG, ED     EKG     RADIOLOGY  CT ABDOMEN PELVIS WO CONTRAST  Result Date: 06/14/2021 CLINICAL DATA:  Diabetic presenting from home with vomiting for 2-3 days. EXAM: CT ABDOMEN AND PELVIS WITHOUT CONTRAST TECHNIQUE: Multidetector CT imaging of the abdomen and pelvis was performed following the standard protocol without IV contrast. RADIATION DOSE REDUCTION: This exam was performed according to the departmental dose-optimization program which includes automated exposure control, adjustment of the mA and/or kV according to patient size and/or use of iterative reconstruction technique. COMPARISON:  02/23/2021 FINDINGS: Lower chest: Moderate to large right pleural effusion. Small to moderate left pleural effusion noted. Hepatobiliary: No focal liver abnormality. There is mild diffuse gallbladder wall edema. Stones are noted layering within the dependent portion of the gallbladder. No signs of bile duct dilatation. Pancreas: Unremarkable. No pancreatic ductal dilatation or surrounding inflammatory changes. Spleen: Normal in size without focal abnormality. Adrenals/Urinary Tract: Normal appearance of the adrenal glands. The left kidney is unremarkable. Right-sided  pelvocaliectasis is identified. No urinary tract calculi identified bilaterally. Bladder appears normal. Stomach/Bowel: Stomach appears normal. The appendix is visualized and is within normal limits. No bowel wall thickening, inflammation or distension identified. Vascular/Lymphatic: No significant vascular findings are present. No enlarged abdominal or pelvic lymph nodes. Reproductive: Uterus and bilateral adnexa are unremarkable. Other: Interval increase in abdominopelvic ascites. Musculoskeletal: Previously noted ventral abdominal wall fluid collection/hematoma has decreased in size compared with 02/23/2021. On today's study this measures 5.2 x 2.3 by 5.9 cm. Previously this measured 11.8 by 11.0 by 14.4 cm. There is marked diffuse body wall edema  compatible with anasarca. 2 no acute or suspicious osseous findings. IMPRESSION: 1. Marked diffuse body wall edema compatible with anasarca. 2. Interval increase in abdominopelvic ascites. 3. Decrease in size of ventral abdominal wall fluid collection/hematoma. 4. Bilateral pleural effusions, right greater than left. 5. Gallstones with mild gallbladder wall edema. Findings are nonspecific in the setting of ascites. If there is a clinical concern for acute cholecystitis consider further evaluation with right upper quadrant sonogram. 6. Right-sided pelvocaliectasis. No urinary tract calculi identified. Electronically Signed   By: Signa Kell M.D.   On: 06/14/2021 14:37   US ABDOMEN LIMITED RUQ (LIVER/GB)  Result Date: 06/14/2021 CLINICAL DATA:  Right upper quadrant pain 3 days.  Transaminitis. EXAM: ULTRASOUND ABDOMEN LIMITED RIGHT UPPER QUADRANT COMPARISON:  CT abdomen pelvis 02/23/2021 FINDINGS: Gallbladder: Multiple gallstones up to 8 mm in diameter. Negative sonographic Murphy sign. Fluid around the gallbladder may be pericholecystic fluid versus ascites. Common bile duct: Diameter: 3 mm Liver: No focal lesion identified. Within normal limits in parenchymal  echogenicity. Portal vein is patent on color Doppler imaging with normal direction of blood flow towards the liver. Other: Small amount of ascites Right pleural effusion IMPRESSION: Cholelithiasis without   biliary dilatation Ascites Right pleural effusion Electronically Signed   By: Marlan Palau M.D.   On: 06/14/2021 13:25      Ultrasound imaging reviewed positive for cholelithiasis without evidence of biliary dilatation  Also has ascites and right pleural effusion  CT abdomen pelvis shows diffuse edema.  Personally viewed these images as well.  Personally interpreted the images is notable for concerns of free fluid or ascites in the abdomen.  Radiologist also denotes ascites, decreased size and a ventral wall fluid collection.  Bilateral pleural effusions gallstones with mild gallbladder wall edema.        PROCEDURES:  Critical Care performed: Yes, see critical care procedure note(s)  CRITICAL CARE Performed by: Sharyn Creamer   Total critical care time: 30 minutes  Critical care time was exclusive of separately billable procedures and treating other patients.  Critical care was necessary to treat or prevent imminent or life-threatening deterioration.  Critical care was time spent personally by me on the following activities: development of treatment plan with patient and/or surrogate as well as nursing, discussions with consultants, evaluation of patient's response to treatment, examination of patient, obtaining history from patient or surrogate, ordering and performing treatments and interventions, ordering and review of laboratory studies, ordering and review of radiographic studies, pulse oximetry and re-evaluation of patient's condition.   Procedures   MEDICATIONS ORDERED IN ED: Medications  sodium chloride 0.9 % bolus 500 mL (has no administration in time range)  levothyroxine (SYNTHROID) tablet 25 mcg (has no administration in time range)  lactated ringers bolus 1,044  mL (has no administration in time range)  insulin regular, human (MYXREDLIN) 100 units/ 100 mL infusion (has no administration in time range)  lactated ringers infusion (has no administration in time range)  dextrose 5 % in lactated ringers infusion (has no administration in time range)  dextrose 50 % solution 0-50 mL (has no administration in time range)  furosemide (LASIX) injection 40 mg (has no administration in time range)  hydrocortisone sodium succinate (SOLU-CORTEF) 100 MG injection 100 mg (has no administration in time range)  hydrocortisone sodium succinate (SOLU-CORTEF) 100 MG injection 100 mg (100 mg Intravenous Given 06/14/21 1038)  sodium chloride 0.9 % bolus 1,000 mL (1,000 mLs Intravenous New Bag/Given 06/14/21 1030)  ondansetron (ZOFRAN) injection 4 mg (4 mg  Intravenous Given 06/14/21 1220)  iohexol (OMNIPAQUE) 300 MG/ML solution 80 mL (80 mLs Intravenous Contrast Given 06/14/21 1359)     IMPRESSION / MDM / ASSESSMENT AND PLAN / ED COURSE  I reviewed the triage vital signs and the nursing notes.                              Differential diagnosis includes, but is not limited to, adrenal crisis, acute metabolic abnormality, diabetes complications, acidosis, or other toxic metabolic etiologies.  She appears generally quite fatigued, nauseated vomiting denies abdominal pain.  Doubt acute intra-abdominal process and she reports multiple presentations with similar nature in the past she has been admitted twice recently.  Highly concerned about the fact that she is not been on steroids with a history of adrenal insufficiency.  We will dose 100 mg Solu-Cortef and start IV fluids now.  Continue to monitor carefully, follow-up on pending laboratory studies which include beta hydroxy, venous blood gas cortisol etc.   ----------------------------------------- 3:26 PM on 06/14/2021 -----------------------------------------   Patient being treated for acute adrenal insufficiency, labs  concerning for DKA.  IV access has now been reestablished, and the patient will be admitted to the hospitalist service under Dr. Joylene Igo who I discussed the case with and we discussed appropriateness of level of care and felt to be appropriate for hospitalist service at this time.  Appreciate consultation with the hospitalist.  The patient does have evidence of concerns for transaminitis mildly elevated INR, but no associated right upper quadrant pain and based on her clinical exam I doubt this is acute cholecystitis.  I suspect the patient may have anasarca and also gallbladder wall edema, she will certainly need further work-up and care under the hospitalist service.  They will be initiating treatment for suspected DKA with surprisingly low glucose level but given her known use of insulin for last 2 to 3 days with nausea and vomiting and I suspect this is DKA.  Also treating with IV steroids for concerns of acute adrenal insufficiency   Independently reviewed the patient's imaging as well as labs.  Coordinated and placed consultation with the hospitalist service for concern the patient required admission  Clinical Course as of 06/14/21 1527  Sun Jun 14, 2021  1143 Discussed with patient, she reports no history of IV drug use no history of hepatitis, no recent fevers and denies right upper abdominal pain.  Denies having any abdominal pain.  Does not take any products containing acetaminophen or Tylenol, denies any ingestion.  She does not drink any alcohol. [MQ]  1143 Discussed with the patient that based on her testing so far it would seem that she will need to be hospitalized, she is understanding agreeable with this.  Does feel improved after receiving antiemetic and starting IV fluids [MQ]  1143 Her labs are reviewed and her mild hyperkalemia suspect is likely secondary to adrenal insufficiency.  She has been treated with hydrocortisone [MQ]  1143 Searching out etiology for metabolic [MQ]  1143  Searching out etiology for metabolic acidosis with anion gap at this time [MQ]  1439 On patient lost her peripheral access.  IV team consult has been placed.  Awaiting CT imaging report Patient awake and alert, hemodynamics normal.  She reports ongoing nausea at this time [MQ]  1439 Labs are concerning for possible ketoacidosis but her glucoses not significantly elevated at this time.  She reports she has not used her insulin for about  2 to 3 days since nausea started in. [MQ]    Clinical Course User Index [MQ] Sharyn Creamer, MD   To this point I do not see evidence of acute bacterial infection.  Further work-up is pending under the care of the hospitalist service  FINAL CLINICAL IMPRESSION(S) / ED DIAGNOSES   Final diagnoses:  Transaminitis  Hyperkalemia  Acute adrenal insufficiency (HCC)  Nausea and vomiting, unspecified vomiting type  AKI (acute kidney injury) (HCC)     Rx / DC Orders   ED Discharge Orders     None        Note:  This document was prepared using Dragon voice recognition software and may include unintentional dictation errors.   Sharyn Creamer, MD 06/14/21 [phone]

## 2021-06-14 NOTE — ED Triage Notes (Signed)
Pt via POV from home. Pt c/o vomiting for the past 2-3 days. Denies pain. Denies fever. Denies diarrhea. Pt still has gallbladder and appendix.  Pt is A&OX4 and NAD.

## 2021-06-15 ENCOUNTER — Encounter: Payer: Self-pay | Admitting: Internal Medicine

## 2021-06-15 DIAGNOSIS — E2740 Unspecified adrenocortical insufficiency: Secondary | ICD-10-CM | POA: Diagnosis not present

## 2021-06-15 DIAGNOSIS — F32A Depression, unspecified: Secondary | ICD-10-CM | POA: Diagnosis not present

## 2021-06-15 DIAGNOSIS — N179 Acute kidney failure, unspecified: Secondary | ICD-10-CM | POA: Diagnosis not present

## 2021-06-15 DIAGNOSIS — E1010 Type 1 diabetes mellitus with ketoacidosis without coma: Secondary | ICD-10-CM | POA: Diagnosis not present

## 2021-06-15 DIAGNOSIS — R112 Nausea with vomiting, unspecified: Secondary | ICD-10-CM | POA: Diagnosis not present

## 2021-06-15 DIAGNOSIS — K3184 Gastroparesis: Secondary | ICD-10-CM | POA: Diagnosis not present

## 2021-06-15 DIAGNOSIS — E1143 Type 2 diabetes mellitus with diabetic autonomic (poly)neuropathy: Secondary | ICD-10-CM | POA: Diagnosis not present

## 2021-06-15 DIAGNOSIS — R7401 Elevation of levels of liver transaminase levels: Secondary | ICD-10-CM | POA: Diagnosis not present

## 2021-06-15 LAB — GLUCOSE, CAPILLARY
Glucose-Capillary: 105 mg/dL — ABNORMAL HIGH (ref 70–99)
Glucose-Capillary: 109 mg/dL — ABNORMAL HIGH (ref 70–99)
Glucose-Capillary: 117 mg/dL — ABNORMAL HIGH (ref 70–99)
Glucose-Capillary: 121 mg/dL — ABNORMAL HIGH (ref 70–99)
Glucose-Capillary: 129 mg/dL — ABNORMAL HIGH (ref 70–99)
Glucose-Capillary: 145 mg/dL — ABNORMAL HIGH (ref 70–99)
Glucose-Capillary: 160 mg/dL — ABNORMAL HIGH (ref 70–99)
Glucose-Capillary: 222 mg/dL — ABNORMAL HIGH (ref 70–99)

## 2021-06-15 LAB — COMPREHENSIVE METABOLIC PANEL
ALT: 426 U/L — ABNORMAL HIGH (ref 0–44)
ALT: 440 U/L — ABNORMAL HIGH (ref 0–44)
AST: 561 U/L — ABNORMAL HIGH (ref 15–41)
AST: 675 U/L — ABNORMAL HIGH (ref 15–41)
Albumin: 2.8 g/dL — ABNORMAL LOW (ref 3.5–5.0)
Albumin: 2.9 g/dL — ABNORMAL LOW (ref 3.5–5.0)
Alkaline Phosphatase: 159 U/L — ABNORMAL HIGH (ref 38–126)
Alkaline Phosphatase: 159 U/L — ABNORMAL HIGH (ref 38–126)
Anion gap: 6 (ref 5–15)
Anion gap: 9 (ref 5–15)
BUN: 30 mg/dL — ABNORMAL HIGH (ref 6–20)
BUN: 30 mg/dL — ABNORMAL HIGH (ref 6–20)
CO2: 25 mmol/L (ref 22–32)
CO2: 26 mmol/L (ref 22–32)
Calcium: 8.1 mg/dL — ABNORMAL LOW (ref 8.9–10.3)
Calcium: 8.2 mg/dL — ABNORMAL LOW (ref 8.9–10.3)
Chloride: 108 mmol/L (ref 98–111)
Chloride: 109 mmol/L (ref 98–111)
Creatinine, Ser: 1.97 mg/dL — ABNORMAL HIGH (ref 0.44–1.00)
Creatinine, Ser: 1.98 mg/dL — ABNORMAL HIGH (ref 0.44–1.00)
GFR, Estimated: 34 mL/min — ABNORMAL LOW (ref >60)
GFR, Estimated: 34 mL/min — ABNORMAL LOW (ref >60)
Glucose, Bld: 148 mg/dL — ABNORMAL HIGH (ref 70–99)
Glucose, Bld: 152 mg/dL — ABNORMAL HIGH (ref 70–99)
Potassium: 3.6 mmol/L (ref 3.5–5.1)
Potassium: 3.9 mmol/L (ref 3.5–5.1)
Sodium: 141 mmol/L (ref 135–145)
Sodium: 142 mmol/L (ref 135–145)
Total Bilirubin: 0.9 mg/dL (ref 0.3–1.2)
Total Bilirubin: 1 mg/dL (ref 0.3–1.2)
Total Protein: 5.2 g/dL — ABNORMAL LOW (ref 6.5–8.1)
Total Protein: 5.3 g/dL — ABNORMAL LOW (ref 6.5–8.1)

## 2021-06-15 LAB — IRON AND TIBC
Iron: 77 ug/dL (ref 28–170)
Saturation Ratios: 61 % — ABNORMAL HIGH (ref 10.4–31.8)
TIBC: 126 ug/dL — ABNORMAL LOW (ref 250–450)
UIBC: 49 ug/dL

## 2021-06-15 LAB — BETA-HYDROXYBUTYRIC ACID
Beta-Hydroxybutyric Acid: 0.13 mmol/L (ref 0.05–0.27)
Beta-Hydroxybutyric Acid: 0.99 mmol/L — ABNORMAL HIGH (ref 0.05–0.27)

## 2021-06-15 LAB — HEPATITIS PANEL, ACUTE
HCV Ab: NONREACTIVE
Hep A IgM: NONREACTIVE
Hep B C IgM: NONREACTIVE
Hepatitis B Surface Ag: NONREACTIVE

## 2021-06-15 LAB — ACTH: C206 ACTH: 8 pg/mL (ref 7.2–63.3)

## 2021-06-15 LAB — CBC
HCT: 33.2 % — ABNORMAL LOW (ref 36.0–46.0)
Hemoglobin: 10.5 g/dL — ABNORMAL LOW (ref 12.0–15.0)
MCH: 30.5 pg (ref 26.0–34.0)
MCHC: 31.6 g/dL (ref 30.0–36.0)
MCV: 96.5 fL (ref 80.0–100.0)
Platelets: 94 10*3/uL — ABNORMAL LOW (ref 150–400)
RBC: 3.44 MIL/uL — ABNORMAL LOW (ref 3.87–5.11)
RDW: 14.5 % (ref 11.5–15.5)
WBC: 4 10*3/uL (ref 4.0–10.5)
nRBC: 0 % (ref 0.0–0.2)

## 2021-06-15 LAB — MAGNESIUM: Magnesium: 1.9 mg/dL (ref 1.7–2.4)

## 2021-06-15 LAB — PREALBUMIN: Prealbumin: 5.1 mg/dL — ABNORMAL LOW (ref 18–38)

## 2021-06-15 LAB — GAMMA GT: GGT: 39 U/L (ref 7–50)

## 2021-06-15 LAB — PHOSPHORUS: Phosphorus: 2.7 mg/dL (ref 2.5–4.6)

## 2021-06-15 LAB — PROTIME-INR
INR: 1.9 — ABNORMAL HIGH (ref 0.8–1.2)
Prothrombin Time: 22 seconds — ABNORMAL HIGH (ref 11.4–15.2)

## 2021-06-15 LAB — FERRITIN: Ferritin: 3657 ng/mL — ABNORMAL HIGH (ref 11–307)

## 2021-06-15 MED ORDER — LOPERAMIDE HCL 2 MG PO CAPS
2.0000 mg | ORAL_CAPSULE | Freq: Four times a day (QID) | ORAL | Status: DC | PRN
  Administered 2021-06-15 – 2021-06-18 (×2): 2 mg via ORAL
  Filled 2021-06-15 (×2): qty 1

## 2021-06-15 MED ORDER — INSULIN ASPART 100 UNIT/ML IJ SOLN
2.0000 [IU] | Freq: Once | INTRAMUSCULAR | Status: AC
  Administered 2021-06-15: 2 [IU] via SUBCUTANEOUS
  Filled 2021-06-15: qty 1

## 2021-06-15 MED ORDER — ERYTHROMYCIN BASE 250 MG PO TBEC
250.0000 mg | DELAYED_RELEASE_TABLET | Freq: Four times a day (QID) | ORAL | Status: DC
  Administered 2021-06-15 – 2021-06-19 (×17): 250 mg via ORAL
  Filled 2021-06-15 (×19): qty 1

## 2021-06-15 MED ORDER — FAMOTIDINE 20 MG PO TABS
20.0000 mg | ORAL_TABLET | Freq: Two times a day (BID) | ORAL | Status: DC
  Administered 2021-06-15 – 2021-06-17 (×5): 20 mg via ORAL
  Filled 2021-06-15 (×5): qty 1

## 2021-06-15 MED ORDER — INSULIN ASPART 100 UNIT/ML IJ SOLN
0.0000 [IU] | Freq: Three times a day (TID) | INTRAMUSCULAR | Status: DC
  Administered 2021-06-16 (×2): 1 [IU] via SUBCUTANEOUS
  Administered 2021-06-17: 09:00:00 2 [IU] via SUBCUTANEOUS
  Administered 2021-06-17: 17:00:00 1 [IU] via SUBCUTANEOUS
  Administered 2021-06-17: 13:00:00 2 [IU] via SUBCUTANEOUS
  Administered 2021-06-18 – 2021-06-19 (×2): 1 [IU] via SUBCUTANEOUS
  Filled 2021-06-15 (×7): qty 1

## 2021-06-15 MED ORDER — ALUM & MAG HYDROXIDE-SIMETH 200-200-20 MG/5ML PO SUSP
30.0000 mL | Freq: Three times a day (TID) | ORAL | Status: DC | PRN
  Administered 2021-06-15 – 2021-06-18 (×6): 30 mL via ORAL
  Filled 2021-06-15 (×6): qty 30

## 2021-06-15 MED ORDER — ADULT MULTIVITAMIN W/MINERALS CH
1.0000 | ORAL_TABLET | Freq: Every day | ORAL | Status: DC
  Administered 2021-06-16 – 2021-06-19 (×4): 1 via ORAL
  Filled 2021-06-15 (×4): qty 1

## 2021-06-15 MED ORDER — LIDOCAINE VISCOUS HCL 2 % MT SOLN
15.0000 mL | Freq: Three times a day (TID) | OROMUCOSAL | Status: DC | PRN
  Administered 2021-06-15 – 2021-06-17 (×5): 15 mL via ORAL
  Filled 2021-06-15 (×5): qty 15

## 2021-06-15 MED ORDER — LACTATED RINGERS IV SOLN: INTRAVENOUS | Status: DC

## 2021-06-15 MED ORDER — GLUCERNA SHAKE PO LIQD
237.0000 mL | Freq: Three times a day (TID) | ORAL | Status: DC
  Administered 2021-06-15 – 2021-06-19 (×9): 237 mL via ORAL

## 2021-06-15 MED ORDER — SODIUM CHLORIDE 0.9 % IV SOLN
12.5000 mg | Freq: Four times a day (QID) | INTRAVENOUS | Status: DC | PRN
  Administered 2021-06-15: 21:00:00 12.5 mg via INTRAVENOUS
  Filled 2021-06-15: qty 0.5
  Filled 2021-06-15: qty 12.5

## 2021-06-15 MED ORDER — INSULIN GLARGINE-YFGN 100 UNIT/ML ~~LOC~~ SOLN
4.0000 [IU] | Freq: Two times a day (BID) | SUBCUTANEOUS | Status: DC
  Administered 2021-06-15 – 2021-06-17 (×6): 4 [IU] via SUBCUTANEOUS
  Filled 2021-06-15 (×8): qty 0.04

## 2021-06-15 NOTE — Consult Note (Addendum)
GI Inpatient Consult Note  Reason for Consult: Transaminitis    Attending Requesting Consult: Dr. Royce Macadamia Agbata  History of Present Illness: Ashley Camacho is a 31 y.o. female seen for evaluation of transaminitis at the request of admitting hospitalist - Dr. Collier Bullock. Pt has a PMH of T1DM, depression, anxiety, hypothyroidism, adrenal insufficiency, chronic anemia, diabetic gastroparesis, and CKD Stage III. She presented to the [facility] ED yesterday morning for chief complaint of 2-day history of poor oral intake and nausea and vomiting. She had not used her insulin over the past two days. Upon presentation to the ED, labs were significant for sodium 141, potassium 5.2, bicarb 13, glucose 187, BUN 27, serum creatinine 1.80, anion gap 20, albumin 3.5, alkaline phosphatase 202, AST 1648, ALT 554, total bilirubin 1.7, lactic acid 2.4, WBC 8.0K, hemoglobin 11.6, platelet 114K, INR 2.2, and beta hydroxybutyric 4.94. Urine pregnancy test negative. Respiratory virus panel negative. CT abd/pelvis showed marked diffuse body wall edema compatible with anasarca, abdominopelvic ascites, bilateral pleural effusions right > left, gallstones with mild gallbladder wall edema. RUQ US showed cholelithiasis without biliary dilatation. She was started on DKA protocol and admitted to the ICU for further management.   Patient seen and examined this morning resting comfortably in ICU bed. No acute events overnight. She reports she is still nauseous, but has not had any vomiting overnight. Reglan has not worked previously for her nausea. She denies any fevers, chills, abdominal pain, or changes in her bowel habits. She denies any EtOH, tobacco, or illicit drug use. No sick contacts or recent travel. No recent raw seafood or mushroom ingestion. For the past two days she hasn't really eaten anything. She does not use NSAIDs. She does not regularly follow a gastroparesis diet at home. She reports she had a lot of issues  with nausea and vomiting with her most recent pregnancy, delivery in September 2022. When she feels poorly she doesn't take her medications. She lives at home with her two children - 45 years old and 63-monthold - and boyfriend of 12 years. She does feel safe in the home. She has hx of multiple medical admissions for hypoglycemia episodes and DKA. During hospitalization last month she did require intubation. She also had admission last month for CAP. She was hospitalized 12/28 - 1/2 for DKA. She had EGD in August 2022 for nausea/vomiting which showed LA Grade C reflux esophagitis, gastritis, and normal duodenum.    EGD 01/14/21 - LA Grade C reflux esophagitis with esophageal biopsies showing ulceration with granulation tissue, gastritis, normal duodenum with negative duodenal biopsies   Past Medical History:  Past Medical History:  Diagnosis Date   Adrenal insufficiency ([facility]    Anemia    Gastroparesis    Hypertension    Type 1 diabetes ([facility]     Problem List: Patient Active Problem List   Diagnosis Date Noted   Hypoglycemia due to insulin 06/14/2021   DKA (diabetic ketoacidosis) ([facility] 06/14/2021   CKD stage 3 due to type 1 diabetes mellitus ([facility] 029/79/8921  Acute diastolic CHF (congestive heart failure) ([facility] 06/14/2021   Transaminitis 06/14/2021   Hypoglycemia associated with diabetes ([facility] 06/03/2021   Adrenal insufficiency ([facility] 06/01/2021   DKA, type 1 ([facility] 05/20/2021   Moderate recurrent major depression ([place] 05/08/2021   Right arm pain 05/07/2021   Rash of hand 05/07/2021   Hyperkalemia 05/06/2021   History of adrenal insufficiency 05/06/2021   CAP (community acquired pneumonia) 05/05/2021   Hypoglycemia 04/27/2021  Postoperative hematoma involving genitourinary system following genitourinary procedure    S/P cesarean section 02/17/2021   Acute esophagitis    Leg edema    History of preterm delivery, currently pregnant    Pre-existing type 1 diabetes mellitus during  pregnancy in third trimester    Anemia during pregnancy    Type 1 diabetes mellitus affecting pregnancy in second trimester, antepartum    HFrEF (heart failure with reduced ejection fraction) (HCC)    Acute deep vein thrombosis (DVT) of brachial vein of right upper extremity ([place]) 12/17/2020   Anxiety 12/17/2020   Diabetic retinopathy ([place]) 12/17/2020   Acute on chronic combined systolic and diastolic CHF (congestive heart failure) ([place])    Hyperemesis 12/13/2020   Type 1 diabetes mellitus with hypoglycemia without coma ([place]) 12/13/2020   Sunburn 12/13/2020   Hypothyroidism 12/10/2020   Malnutrition ([place]) 12/10/2020   Vitreous hemorrhage of right eye ([place]) 12/10/2020   Pleural effusion associated with pulmonary infection 11/25/2020   Pneumonia affecting pregnancy 11/24/2020   Diabetes mellitus affecting pregnancy, second trimester 11/24/2020   Edema 11/24/2020   Decreased urine output 11/24/2020   Shortness of breath    Zinc deficiency    SOB (shortness of breath)    Social problem 11/21/2020   Nausea and vomiting during pregnancy prior to [redacted] weeks gestation 11/21/2020   Low serum vitamin B12    Delivery with history of C-section 11/20/2020   Absolute anemia    Acute kidney injury ([place])    Nausea and vomiting during pregnancy 10/23/2020   History of premature delivery, currently pregnant, second trimester 10/13/2020   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   Depressive disorder 09/28/2018   Peripheral neuropathy 09/28/2018   Diabetic gastroparesis ([place]) 04/22/2016   Diabetic sensorimotor neuropathy ([place]) 04/19/2016   DKA (diabetic ketoacidoses) 04/03/2016   Microalbuminuria 09/26/2005    Past Surgical History: Past Surgical History:  Procedure Laterality Date   BIOPSY  01/14/2021   Procedure: BIOPSY;  Surgeon: Thornton Park, MD;  Location: [place];  Service: Gastroenterology;;   CESAREAN SECTION     CESAREAN SECTION WITH BILATERAL TUBAL LIGATION N/A 02/17/2021    Procedure: CESAREAN SECTION WITH BILATERAL TUBAL LIGATION;  Surgeon: Truett Mainland, DO;  Location: [facility];  Service: Obstetrics;  Laterality: N/A;   ESOPHAGOGASTRODUODENOSCOPY (EGD) WITH PROPOFOL N/A 01/14/2021   Procedure: ESOPHAGOGASTRODUODENOSCOPY (EGD) WITH PROPOFOL;  Surgeon: Thornton Park, MD;  Location: [place];  Service: Gastroenterology;  Laterality: N/A;   MOUTH SURGERY      Allergies: No Known Allergies  Home Medications: Medications Prior to Admission  Medication Sig Dispense Refill Last Dose   insulin glargine (LANTUS) 100 UNIT/ML injection Inject 4 Units into the skin 2 (two) times daily. Inject 4 units in the morning and 4 units in the evening   06/12/2021   acetaminophen (TYLENOL) 500 MG tablet Take 2 tablets (1,000 mg total) by mouth every 6 (six) hours as needed for headache, fever or moderate pain. 30 tablet 3 PRN at PRN   hydrocortisone (CORTEF) 10 MG tablet Take 1 tablet (10 mg total) by mouth once every morning. (Patient not taking: Reported on 06/14/2021) 30 tablet 0 Not Taking   hydrocortisone (CORTEF) 5 MG tablet Take 1 tablet (5 mg total) by mouth once daily at 4PM. (Patient not taking: Reported on 06/14/2021) 30 tablet 0 Not Taking   insulin aspart (NOVOLOG) 100 UNIT/ML FlexPen Inject 2 Units into the skin 3 (three) times daily with meals Inject 0-6 Units into the skin every 4 (four)  hours. 15 mL 11 06/12/2021   levothyroxine (SYNTHROID) 25 MCG tablet Take 1 tablet (25 mcg total) by mouth once daily at 6 (six) AM. (Patient not taking: Reported on 06/02/2021) 30 tablet 3 Not Taking   metoCLOPramide (REGLAN) 10 MG tablet Take 1 tablet (10 mg total) by mouth 3 (three) times daily with meals. (Patient not taking: Reported on 06/14/2021) 90 tablet 0 Not Taking   Multiple Vitamin (MULTIVITAMIN WITH MINERALS) TABS tablet Take 1 tablet by mouth daily. (Patient not taking: Reported on 05/20/2021)   Not Taking   Home medication reconciliation was completed with the  patient.   Scheduled Inpatient Medications:    Chlorhexidine Gluconate Cloth  6 each Topical Q0600   furosemide  40 mg Intravenous Daily   hydrocortisone sod succinate (SOLU-CORTEF) inj  100 mg Intravenous Q12H   levothyroxine  25 mcg Oral Q0600    Continuous Inpatient Infusions:    dextrose 5% lactated ringers 125 mL/hr at 06/15/21 0659   insulin 0.4 Units/hr (06/15/21 0659)   lactated ringers Stopped (06/14/21 1720)    PRN Inpatient Medications:  dextrose  Family History: family history includes Breast cancer in her mother; CAD in her father, paternal grandfather, and paternal grandmother; Diabetes type I in her father.  The patient's family history is negative for inflammatory bowel disorders, GI malignancy, or solid organ transplantation.  Social History:   reports that she has never smoked. She has never used smokeless tobacco. She reports that she does not drink alcohol and does not use drugs. The patient denies ETOH, tobacco, or drug use.   Review of Systems: Constitutional: Weight is stable.  Eyes: No changes in vision. ENT: No oral lesions, sore throat.  GI: see HPI.  Heme/Lymph: No easy bruising.  CV: No chest pain.  GU: No hematuria.  Integumentary: No rashes.  Neuro: No headaches.  Psych: No depression/anxiety.  Endocrine: No heat/cold intolerance.  Allergic/Immunologic: No urticaria.  Resp: No cough, SOB.  Musculoskeletal: No joint swelling.    Physical Examination: BP 106/79    Pulse 95    Temp 98.6 F (37 C) (Oral)    Resp 15    Ht 5' 1"  (1.549 m)    Wt 63.9 kg    LMP 05/19/2021    SpO2 96%    BMI 26.62 kg/m  Gen: NAD, alert and oriented x 4 HEENT: PEERLA, EOMI, Neck: supple, no JVD or thyromegaly Chest: CTA bilaterally, no wheezes, crackles, or other adventitious sounds CV: RRR, no m/g/c/r Abd: soft, NT, ND, +BS in all four quadrants; no HSM, guarding, ridigity, or rebound tenderness Ext: no edema, well perfused with 2+ pulses, Skin: no rash or  lesions noted Lymph: no LAD  Data: Lab Results  Component Value Date   WBC 4.0 06/15/2021   HGB 10.5 (L) 06/15/2021   HCT 33.2 (L) 06/15/2021   MCV 96.5 06/15/2021   PLT 94 (L) 06/15/2021   Recent Labs  Lab 06/14/21 1003 06/15/21 0342  HGB 11.6* 10.5*   Lab Results  Component Value Date   NA 141 06/15/2021   K 3.6 06/15/2021   CL 109 06/15/2021   CO2 26 06/15/2021   BUN 30 (H) 06/15/2021   CREATININE 1.97 (H) 06/15/2021   Lab Results  Component Value Date   ALT 426 (H) 06/15/2021   AST 561 (H) 06/15/2021   ALKPHOS 159 (H) 06/15/2021   BILITOT 0.9 06/15/2021   Recent Labs  Lab 06/15/21 0342  INR 1.9*   CT abd/pelvis without contrast 06/14/21:  IMPRESSION: 1. Marked diffuse body wall edema compatible with anasarca. 2. Interval increase in abdominopelvic ascites. 3. Decrease in size of ventral abdominal wall fluid collection/hematoma. 4. Bilateral pleural effusions, right greater than left. 5. Gallstones with mild gallbladder wall edema. Findings are nonspecific in the setting of ascites. If there is a clinical concern for acute cholecystitis consider further evaluation with right upper quadrant sonogram. 6. Right-sided pelvocaliectasis. No urinary tract calculi identified.  RUQ [place] 06/14/21: IMPRESSION: Cholelithiasis without   biliary dilatation   Ascites   Right pleural effusion   Assessment/Plan:  31 y/o Caucasian female with a PMH of T1DM, depression, anxiety, hypothyroidism, adrenal insufficiency, chronic anemia, diabetic gastroparesis, and CKD Stage III presented to the [facility] ED yesterday morning for chief complaint of nausea and vomiting admitted for DKA. GI consulted for further evaluation and management of transaminitis   Transaminitis - upon presentation to the ED she had marked transaminitis with AST 1648 and ALT 554. She also had mildly elevated alk phos 202 and elevated total bilirubin 1.7. Imaging negative for any extrahepatic or intrahepatic  biliary dilatation. No clinical concern for choledocholithiasis or cholangitis. No evidence of acute hepatic decompensation (severe ascites, encephalopathy, etc). Aminotransferases are improving and trending down with repeat CMP this morning showing AST 561 and ALT 426. Differential diagnosis includes ischemic hepatopathy from transient hypoperfusion 2/2 DKA and diastolic dysfunction, acute viral hepatitis, toxin exposure, autoimmune hepatitis, or idiosyncratic drug reactions.   Nausea and vomiting - 2/2 underlying DKA, no vomiting overnight. Likely exacerbated in setting of gastroparesis.   DKA - euglycemic   Adrenal insufficiency - on IV hydrocortisone  CKD Stage III - 2/2 uncontrolled T1DM  Hx of medication noncompliance   Recommendations:  - LFTs trending down which is reassuring. Follow-up on other hepatic serologic markers to rule out other intrinsic causes of liver disease (A1A, AMA, ANA, ASMA, ceruloplasmin, CMV, EBV, HAV, HBV, etc) - Continue to trend LFTs - Continue treatment of DKA per hospital team - Continue supportive care  - Add on famotidine 20 mg PO BID and viscous lidocaine prn  - No plans for luminal evaluation at present time - Consider short-term treatment with erythromycin for nausea in setting of known diabetic gastroparesis - Consider referral to psychiatry for assessment. Patient is very withdrawn on exam and has untreated anxiety and depression. She has multiple hospital admissions for hypoglycemia and DKA. - GI will sign off at this time and follow peripherally  Thank you for the consult. Please call with questions or concerns.  Reeves Forth [facility] [phone] (Cell)

## 2021-06-15 NOTE — Progress Notes (Signed)
1730 Report called to [facility], [facility], [facility] on 1C. 1800 Transferred via wheelchair to room 128.

## 2021-06-15 NOTE — Progress Notes (Signed)
Progress Note    Ashley Camacho  [id number] DOB: October 30, 1990  DOA: 06/14/2021 PCP: Pcp, No      Brief Narrative:    Medical records reviewed and are as summarized below:  Ashley Camacho is a 31 y.o. 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  She presented to the hospital again on 06/14/2021 because of nausea and vomiting.  She was found to have DKA, AKI and elevated liver enzymes.      Assessment/Plan:   Principal Problem:   DKA (diabetic ketoacidosis) ([place]) Active Problems:   Diabetic gastroparesis ([place])   Adrenal insufficiency ([place])   CKD stage 3 due to type 1 diabetes mellitus (H[facility])   Acute diastolic CHF (congestive heart failure) (H[facility])   Transaminitis   Body mass index is 26.62 kg/m.   DKA, type I DM with hyperglycemia: DKA has resolved.  Transition from IV insulin infusion to low-dose glargine.  Use NovoLog as needed for hyperglycemia.  Monitor glucose levels closely because of brittle type I DM  AKI on CKD stage IIIa: Continue IV fluids and monitor BMP  Diabetic gastroparesis: Antiemetics as needed for nausea/vomiting.  She has been started on IV erythromycin.  Elevated liver enzymes/acute hepatitis: Improving.  Work-up for hepatitis in progress.  Adrenal insufficiency: Continue IV hydrocortisone for now with plan to switch to oral hydrocortisone tomorrow.  Valvular heart disease (moderate mitral regurgitation, moderate pulmonary regurgitation): Hold IV Lasix  Depression: She said the only reason she stopped taking her Remeron was because  of financial constraints.    Diet Order             Diet Carb Modified Fluid consistency: Thin; Room service appropriate? Yes  Diet effective now                      Consultants: Gastroenterologist  Procedures: None    Medications:    Chlorhexidine Gluconate Cloth  6 each Topical Q0600   erythromycin  250 mg Oral Q6H   hydrocortisone sod succinate (SOLU-CORTEF) inj  100 mg Intravenous Q12H   insulin aspart  0-6 Units Subcutaneous TID WC   insulin glargine-yfgn  4 Units Subcutaneous BID   levothyroxine  25 mcg Oral Q0600   Continuous Infusions:  lactated ringers Stopped (06/14/21 1720)   lactated ringers       Anti-infectives (From admission, onward)    Start     Dose/Rate Route Frequency Ordered Stop   06/15/21 1200  erythromycin (ERY-TAB) EC tablet 250 mg        250 mg Oral Every 6 hours 06/15/21 0825                Family Communication/Anticipated D/C date and plan/Code Status   DVT prophylaxis: SCDs Start: 06/14/21 1512     Code Status: Full Code  Family Communication: None Disposition Plan: Plan to discharge home in 2 to 3 days   Status is: Inpatient  Remains inpatient appropriate because: DKA, monitor glucose and liver enzymes          Subjective:   Interval events noted.  No nausea, vomiting or abdominal pain today.  Objective:    Vitals:   06/15/21 0700 06/15/21 0800 06/15/21 0900 06/15/21 1000  BP: 114/89  112/89 115/89 101/71  Pulse: 87 89 79 72  Resp: (!) 22 (!) 21 16 16   Temp:      TempSrc:      SpO2: 92% 96% 96% 91%  Weight:      Height:       No data found.   Intake/Output Summary (Last 24 hours) at 06/15/2021 1144 Last data filed at 06/15/2021 0659 Gross per 24 hour  Intake 1731.06 ml  Output 525 ml  Net 1206.06 ml   Filed Weights   06/14/21 0943 06/14/21 1805 06/15/21 0500  Weight: 52.2 kg 58.9 kg 63.9 kg    Exam:  GEN: NAD SKIN: No rash EYES: EOMI ENT: MMM CV: RRR PULM: CTA B ABD:  soft, distended, NT, +BS CNS: AAO x 3, non focal EXT: Bilateral leg edema, no tenderness        Data Reviewed:   I have personally reviewed following labs and imaging studies:  Labs: Labs show the following:   Basic Metabolic Panel: Recent Labs  Lab 06/14/21 1003 06/14/21 1004 06/14/21 2337 06/15/21 0342  NA 141  --  142 141  K 5.2*  --  3.9 3.6  CL 108  --  108 109  CO2 13*  --  25 26  GLUCOSE 187*  --  148* 152*  BUN 27*  --  30* 30*  CREATININE 1.80*  --  1.98* 1.97*  CALCIUM 8.5*  --  8.2* 8.1*  MG  --  2.3  --   --    GFR Estimated Creatinine Clearance: 35.7 mL/min (A) (by C-G formula based on SCr of 1.97 mg/dL (H)). Liver Function Tests: Recent Labs  Lab 06/14/21 1003 06/14/21 2337 06/15/21 0342  AST 1,648* 675* 561*  ALT 554* 440* 426*  ALKPHOS 202* 159* 159*  BILITOT 1.7* 1.0 0.9  PROT 6.6 5.2* 5.3*  ALBUMIN 3.5 2.9* 2.8*   Recent Labs  Lab 06/14/21 1003  LIPASE 25   No results for input(s): AMMONIA in the last 168 hours. Coagulation profile Recent Labs  Lab 06/14/21 1202 06/15/21 0342  INR 2.2* 1.9*    CBC: Recent Labs  Lab 06/14/21 1003 06/15/21 0342  WBC 8.0 4.0  HGB 11.6* 10.5*  HCT 38.6 33.2*  MCV 100.0 96.5  PLT 114* 94*   Cardiac Enzymes: Recent Labs  Lab 06/14/21 1202  CKTOTAL 218   BNP (last 3 results) No results for input(s): PROBNP in the last 8760 hours. CBG: Recent Labs  Lab 06/15/21 0259 06/15/21 0635 06/15/21 0740 06/15/21 0830 06/15/21 0930  GLUCAP 145* 121* 109* 105* 117*   D-Dimer: No results for input(s): DDIMER in the last 72 hours. Hgb A1c: No results for input(s): HGBA1C in the last 72 hours. Lipid Profile: No results for input(s): CHOL, HDL, LDLCALC, TRIG, CHOLHDL, LDLDIRECT in the last 72 hours. Thyroid function studies: No results for input(s): TSH, T4TOTAL, T3FREE, THYROIDAB in the last 72 hours.  Invalid input(s): FREET3 Anemia work up: Recent Labs    06/15/21 0342  FERRITIN  3,657*  TIBC 126*  IRON 77   Sepsis Labs: Recent Labs  Lab 06/14/21 1003 06/14/21 1202 06/15/21 0342  WBC 8.0  --  4.0  LATICACIDVEN  --  2.4*  --     Microbiology Recent Results (from the past 240 hour(s))  Resp Panel by RT-PCR (Flu A&B, Covid) Nasopharyngeal Swab     Status: None   Collection Time: 06/14/21 10:27 AM   Specimen: Nasopharyngeal Swab; Nasopharyngeal(NP) swabs in vial transport medium  Result Value Ref Range Status   SARS Coronavirus 2 by RT PCR NEGATIVE NEGATIVE Final    Comment: (NOTE) SARS-CoV-2 target nucleic acids are NOT DETECTED.  The SARS-CoV-2 RNA is generally detectable in upper respiratory specimens during the acute phase of infection. The lowest concentration of SARS-CoV-2 viral copies this assay can detect is 138 copies/mL. A negative result does not preclude SARS-Cov-2 infection and should not be used as the sole basis for treatment or other patient management decisions. A negative result may occur with  improper specimen collection/handling, submission of specimen other than nasopharyngeal swab, presence of viral mutation(s) within the areas targeted by this assay, and inadequate number of viral copies(<138 copies/mL). A negative result must be combined with clinical observations, patient history, and epidemiological information. The expected result is Negative.  Fact Sheet for Patients:  EntrepreneurPulse.com.au  Fact Sheet for Healthcare Providers:  IncredibleEmployment.be  This test is no t yet approved or cleared by the [place] FDA and  has been authorized for detection and/or diagnosis of SARS-CoV-2 by FDA under an Emergency Use Authorization (EUA). This EUA will remain  in effect (meaning this test can be used) for the duration of the COVID-19 declaration under Section 564(b)(1) of the Act, 21 U.S.C.section 360bbb-3(b)(1), unless the authorization is terminated  or revoked sooner.        Influenza A by PCR NEGATIVE NEGATIVE Final   Influenza B by PCR NEGATIVE NEGATIVE Final    Comment: (NOTE) The Xpert Xpress SARS-CoV-2/FLU/RSV plus assay is intended as an aid in the diagnosis of influenza from Nasopharyngeal swab specimens and should not be used as a sole basis for treatment. Nasal washings and aspirates are unacceptable for Xpert Xpress SARS-CoV-2/FLU/RSV testing.  Fact Sheet for Patients: EntrepreneurPulse.com.au  Fact Sheet for Healthcare Providers: IncredibleEmployment.be  This test is not yet approved or cleared by the [place] FDA and has been authorized for detection and/or diagnosis of SARS-CoV-2 by FDA under an Emergency Use Authorization (EUA). This EUA will remain in effect (meaning this test can be used) for the duration of the COVID-19 declaration under Section 564(b)(1) of the Act, 21 U.S.C. section 360bbb-3(b)(1), unless the authorization is terminated or revoked.  Performed at [facility], [address] [place], [place] [address]   MRSA Next Gen by PCR, Nasal     Status: None   Collection Time: 06/14/21  6:08 PM   Specimen: Nasal Mucosa; Nasal Swab  Result Value Ref Range Status   MRSA by PCR Next Gen NOT DETECTED NOT DETECTED Final    Comment: (NOTE) The GeneXpert MRSA Assay (FDA approved for NASAL specimens only), is one component of a comprehensive MRSA colonization surveillance program. It is not intended to diagnose MRSA infection nor to guide or monitor treatment for MRSA infections. Test performance is not FDA approved in patients less than 58 years old. Performed at [facility], [address] [place], [place] [address]     Procedures and diagnostic studies:  CT ABDOMEN PELVIS WO CONTRAST  Result Date: 06/14/2021 CLINICAL DATA:  Diabetic presenting from home with vomiting for 2-3 days. EXAM: CT ABDOMEN AND PELVIS WITHOUT CONTRAST TECHNIQUE: Multidetector CT imaging  of the abdomen and pelvis was performed following the standard protocol without IV contrast. RADIATION DOSE REDUCTION: This exam was performed according to the departmental dose-optimization program which includes automated exposure control, adjustment of the mA and/or kV according to patient size and/or use of iterative reconstruction technique. COMPARISON:  02/23/2021 FINDINGS: Lower chest: Moderate to  large right pleural effusion. Small to moderate left pleural effusion noted. Hepatobiliary: No focal liver abnormality. There is mild diffuse gallbladder wall edema. Stones are noted layering within the dependent portion of the gallbladder. No signs of bile duct dilatation. Pancreas: Unremarkable. No pancreatic ductal dilatation or surrounding inflammatory changes. Spleen: Normal in size without focal abnormality. Adrenals/Urinary Tract: Normal appearance of the adrenal glands. The left kidney is unremarkable. Right-sided pelvocaliectasis is identified. No urinary tract calculi identified bilaterally. Bladder appears normal. Stomach/Bowel: Stomach appears normal. The appendix is visualized and is within normal limits. No bowel wall thickening, inflammation or distension identified. Vascular/Lymphatic: No significant vascular findings are present. No enlarged abdominal or pelvic lymph nodes. Reproductive: Uterus and bilateral adnexa are unremarkable. Other: Interval increase in abdominopelvic ascites. Musculoskeletal: Previously noted ventral abdominal wall fluid collection/hematoma has decreased in size compared with 02/23/2021. On today's study this measures 5.2 x 2.3 by 5.9 cm. Previously this measured 11.8 by 11.0 by 14.4 cm. There is marked diffuse body wall edema compatible with anasarca. 2 no acute or suspicious osseous findings. IMPRESSION: 1. Marked diffuse body wall edema compatible with anasarca. 2. Interval increase in abdominopelvic ascites. 3. Decrease in size of ventral abdominal wall fluid  collection/hematoma. 4. Bilateral pleural effusions, right greater than left. 5. Gallstones with mild gallbladder wall edema. Findings are nonspecific in the setting of ascites. If there is a clinical concern for acute cholecystitis consider further evaluation with right upper quadrant sonogram. 6. Right-sided pelvocaliectasis. No urinary tract calculi identified. Electronically Signed   By: Kerby Moors M.D.   On: 06/14/2021 14:37   US ABDOMEN LIMITED RUQ (LIVER/GB)  Result Date: 06/14/2021 CLINICAL DATA:  Right upper quadrant pain 3 days.  Transaminitis. EXAM: ULTRASOUND ABDOMEN LIMITED RIGHT UPPER QUADRANT COMPARISON:  CT abdomen pelvis 02/23/2021 FINDINGS: Gallbladder: Multiple gallstones up to 8 mm in diameter. Negative sonographic Murphy sign. Fluid around the gallbladder may be pericholecystic fluid versus ascites. Common bile duct: Diameter: 3 mm Liver: No focal lesion identified. Within normal limits in parenchymal echogenicity. Portal vein is patent on color Doppler imaging with normal direction of blood flow towards the liver. Other: Small amount of ascites Right pleural effusion IMPRESSION: Cholelithiasis without   biliary dilatation Ascites Right pleural effusion Electronically Signed   By: Franchot Gallo M.D.   On: 06/14/2021 13:25               LOS: 1 day   Jasmeet Gehl  [facility]   Pager on [email or web address]. If 7PM-7AM, please contact night-coverage at [email or web address]     06/15/2021, 11:44 AM

## 2021-06-15 NOTE — Progress Notes (Addendum)
Inpatient Diabetes Program Recommendations  AACE/ADA: New Consensus Statement on Inpatient Glycemic Control   Target Ranges:  Prepandial:   less than 140 mg/dL      Peak postprandial:   less than 180 mg/dL (1-2 hours)      Critically ill patients:  140 - 180 mg/dL    Latest Reference Range & Units 06/14/21 18:21 06/14/21 19:29 06/14/21 20:56 06/14/21 22:22 06/15/21 00:42 06/15/21 01:47 06/15/21 02:59 06/15/21 06:35  Glucose-Capillary 70 - 99 mg/dL 683 (H) 419 (H) 622 (H) 140 (H) 129 (H) 160 (H) 145 (H) 121 (H)   Review of Glycemic Control  Diabetes history: DM1 hx Outpatient Diabetes medications: Novolog 2 units TID with meals plus Novolog 0-6 units Q4H for correction Current orders for Inpatient glycemic control: IV insulin; Solucortef 100 mg Q12H  Inpatient Diabetes Program Recommendations:    Insulin: When provider is ready to transition off IV insulin, please consider CBGs Q4H and Novolog 0-6 units Q4H.  Outpatient: Patient is not taking Cortef outpatient because she can not afford $4 copay. Also, she is almost out of Novolog Flexpens [phone]) and test strips for glucometer. Have asked TOC to see if they can assist with medication copays at discharge.  NOTE: Patient is well known to inpatient diabetes team for multiple admissions for DKA and hypoglycemia. Patient's Endocrinologist is Dr. Amalia Hailey and inpatient diabetes team frequently communicates with  Nehemiah Settle (nurse at Dr. Darrol Jump office, [place]'s office number [phone]) and cell number [phone]))  Patient recently inpatient 06/01/21-06/04/21 and J. Orlinda Blalock, RN (Diabetes Coordinator) spoke with patient and Nehemiah Settle (Dr. Darrol Jump nurse) on 06/04/21 and patient was not to continue her insulin pump (has been on T-slim pump) and they advised that patient take Lantus 4 units QAM, Lantus 4 units QPM (hold Lantus if CBG <125) and Novolog 0-6 units TID with meals. Per discharge summary on 06/04/21, patient was asked to take Novolog 2 units TID with  meals plus Novolog 0-6 units Q4H per correction scale. During last hospitalization, Dr. Dareen Piano consulted with Dr. Tedd Sias (Endocrinologist) regarding adrenal insufficiency and recurrent hypoglycemia. Attending provider may want to reach out to Dr. Tedd Sias again to discuss patient.  Addendum 06/15/21@13 :45- Spoke with patient at bedside regarding DM control since last discharge. Patient states that when she was discharged on 06/04/21 that she was able to keep fluids and food down and done well for a few days before she began throwing up again. Patient states that when she was discharged she was taking Lantus 4 units BID (was holding if CBG <125) and Novolog 0-6 units for correction. Patient states that her sugars done well for a couple of days and then she started having issues with hypoglycemia again. Patient reports that she did not take any insulin for 2 days prior to admission and she was not able to keep anything down. Asked about taking Cortef and patient states that she does not have Cortef at home because she can not afford copay of $4 for the medication. During prior hospitalization 05/20/21-05/25/21, TOC had made note that petty cash would be used for copays. Asked patient if she was informed about it. Patient states that she was told they would help with the copays but she was told that the pharmacy was closed by the time she was discharged and she did not receive the medication prior to discharge. Patient states that Nehemiah Settle (Dr. Darrol Jump nurse) called her after she was discharged on 06/04/21 to make an appointment for follow up. However, patient was not able to take  any of the appointments because she does not have transportation. Patient asked about transportation with Medicaid in [place] to see if she could get transportation. Informed patient that I would reach out to [facility] to see if they can provide her with information on transportation and also if they can assist with copays for medications at  discharge. Explained to patient that it is very important that she get steroids and start taking them consistently along with insulin. Encouraged patient to work on getting transportation set up so she can attend her follow up appointments with Dr. Amalia Hailey at [facility]. Patient states that she is almost out of Novolog Flexpens and would like to see if she can get Rx for Novolog Flexpens and test strips for glucometer at discharge. Patient verbalized understanding of information and states she has no questions at this time.  Thanks, Orlando Penner, RN, MSN, CDE Diabetes Coordinator Inpatient Diabetes Program [phone] (Team Pager from 8am to 5pm)

## 2021-06-16 DIAGNOSIS — E1143 Type 2 diabetes mellitus with diabetic autonomic (poly)neuropathy: Secondary | ICD-10-CM | POA: Diagnosis not present

## 2021-06-16 DIAGNOSIS — N179 Acute kidney failure, unspecified: Secondary | ICD-10-CM | POA: Diagnosis not present

## 2021-06-16 DIAGNOSIS — E1010 Type 1 diabetes mellitus with ketoacidosis without coma: Secondary | ICD-10-CM | POA: Diagnosis not present

## 2021-06-16 DIAGNOSIS — E2740 Unspecified adrenocortical insufficiency: Secondary | ICD-10-CM | POA: Diagnosis not present

## 2021-06-16 DIAGNOSIS — K3184 Gastroparesis: Secondary | ICD-10-CM | POA: Diagnosis not present

## 2021-06-16 DIAGNOSIS — F32A Depression, unspecified: Secondary | ICD-10-CM | POA: Diagnosis not present

## 2021-06-16 LAB — BRAIN NATRIURETIC PEPTIDE: B Natriuretic Peptide: >4500 pg/mL — ABNORMAL HIGH (ref 0.0–100.0)

## 2021-06-16 LAB — URINALYSIS, ROUTINE W REFLEX MICROSCOPIC
Glucose, UA: NEGATIVE mg/dL
Ketones, ur: 15 mg/dL — AB
Leukocytes,Ua: NEGATIVE
Nitrite: NEGATIVE
Protein, ur: >300 mg/dL — AB
Specific Gravity, Urine: 1.025 (ref 1.005–1.030)
pH: 5 (ref 5.0–8.0)

## 2021-06-16 LAB — URINALYSIS, MICROSCOPIC (REFLEX)

## 2021-06-16 LAB — COMPREHENSIVE METABOLIC PANEL
ALT: 390 U/L — ABNORMAL HIGH (ref 0–44)
AST: 353 U/L — ABNORMAL HIGH (ref 15–41)
Albumin: 3 g/dL — ABNORMAL LOW (ref 3.5–5.0)
Alkaline Phosphatase: 196 U/L — ABNORMAL HIGH (ref 38–126)
Anion gap: 7 (ref 5–15)
BUN: 35 mg/dL — ABNORMAL HIGH (ref 6–20)
CO2: 27 mmol/L (ref 22–32)
Calcium: 8 mg/dL — ABNORMAL LOW (ref 8.9–10.3)
Chloride: 106 mmol/L (ref 98–111)
Creatinine, Ser: 2.31 mg/dL — ABNORMAL HIGH (ref 0.44–1.00)
GFR, Estimated: 28 mL/min — ABNORMAL LOW (ref >60)
Glucose, Bld: 192 mg/dL — ABNORMAL HIGH (ref 70–99)
Potassium: 4.4 mmol/L (ref 3.5–5.1)
Sodium: 140 mmol/L (ref 135–145)
Total Bilirubin: 0.7 mg/dL (ref 0.3–1.2)
Total Protein: 5.2 g/dL — ABNORMAL LOW (ref 6.5–8.1)

## 2021-06-16 LAB — CBC WITH DIFFERENTIAL/PLATELET
Abs Immature Granulocytes: 0.04 10*3/uL (ref 0.00–0.07)
Basophils Absolute: 0 10*3/uL (ref 0.0–0.1)
Basophils Relative: 0 %
Eosinophils Absolute: 0 10*3/uL (ref 0.0–0.5)
Eosinophils Relative: 0 %
HCT: 33.1 % — ABNORMAL LOW (ref 36.0–46.0)
Hemoglobin: 10.4 g/dL — ABNORMAL LOW (ref 12.0–15.0)
Immature Granulocytes: 1 %
Lymphocytes Relative: 5 %
Lymphs Abs: 0.5 10*3/uL — ABNORMAL LOW (ref 0.7–4.0)
MCH: 31.2 pg (ref 26.0–34.0)
MCHC: 31.4 g/dL (ref 30.0–36.0)
MCV: 99.4 fL (ref 80.0–100.0)
Monocytes Absolute: 0.1 10*3/uL (ref 0.1–1.0)
Monocytes Relative: 1 %
Neutro Abs: 8 10*3/uL — ABNORMAL HIGH (ref 1.7–7.7)
Neutrophils Relative %: 93 %
Platelets: 95 10*3/uL — ABNORMAL LOW (ref 150–400)
RBC: 3.33 MIL/uL — ABNORMAL LOW (ref 3.87–5.11)
RDW: 14.8 % (ref 11.5–15.5)
WBC: 8.6 10*3/uL (ref 4.0–10.5)
nRBC: 0.2 % (ref 0.0–0.2)

## 2021-06-16 LAB — EPSTEIN-BARR VIRUS (EBV) ANTIBODY PROFILE
EBV NA IgG: <18 U/mL (ref 0.0–17.9)
EBV VCA IgG: 387 U/mL — ABNORMAL HIGH (ref 0.0–17.9)
EBV VCA IgM: <36 U/mL (ref 0.0–35.9)

## 2021-06-16 LAB — GLUCOSE, CAPILLARY
Glucose-Capillary: 133 mg/dL — ABNORMAL HIGH (ref 70–99)
Glucose-Capillary: 169 mg/dL — ABNORMAL HIGH (ref 70–99)
Glucose-Capillary: 184 mg/dL — ABNORMAL HIGH (ref 70–99)
Glucose-Capillary: 133 mg/dL — ABNORMAL HIGH (ref 70–99)
Glucose-Capillary: 173 mg/dL — ABNORMAL HIGH (ref 70–99)

## 2021-06-16 LAB — PHOSPHORUS: Phosphorus: 3.8 mg/dL (ref 2.5–4.6)

## 2021-06-16 LAB — ANTI-SMOOTH MUSCLE ANTIBODY, IGG: F-Actin IgG: 3 Units (ref 0–19)

## 2021-06-16 LAB — ALPHA-1-ANTITRYPSIN: A-1 Antitrypsin, Ser: 147 mg/dL (ref 100–188)

## 2021-06-16 LAB — MAGNESIUM: Magnesium: 2.1 mg/dL (ref 1.7–2.4)

## 2021-06-16 LAB — CERULOPLASMIN: Ceruloplasmin: 31 mg/dL (ref 19.0–39.0)

## 2021-06-16 LAB — MITOCHONDRIAL ANTIBODIES: Mitochondrial M2 Ab, IgG: <20 Units (ref 0.0–20.0)

## 2021-06-16 MED ORDER — HYDROCORTISONE 10 MG PO TABS
20.0000 mg | ORAL_TABLET | Freq: Every day | ORAL | Status: DC
  Administered 2021-06-17 – 2021-06-19 (×3): 20 mg via ORAL
  Filled 2021-06-16 (×3): qty 2

## 2021-06-16 MED ORDER — ALPRAZOLAM 0.25 MG PO TABS
0.2500 mg | ORAL_TABLET | Freq: Once | ORAL | Status: AC
  Administered 2021-06-16: 12:00:00 0.25 mg via ORAL
  Filled 2021-06-16: qty 1

## 2021-06-16 MED ORDER — MIRTAZAPINE 15 MG PO TABS
15.0000 mg | ORAL_TABLET | Freq: Every day | ORAL | Status: DC
  Administered 2021-06-16: 22:00:00 15 mg via ORAL
  Filled 2021-06-16: qty 1

## 2021-06-16 MED ORDER — HYDROCORTISONE 10 MG PO TABS
10.0000 mg | ORAL_TABLET | Freq: Every day | ORAL | Status: DC
  Administered 2021-06-16 – 2021-06-18 (×3): 10 mg via ORAL
  Filled 2021-06-16 (×4): qty 1

## 2021-06-16 NOTE — Progress Notes (Signed)
PT spoke of a recent past anxiety attack, Ch offered listening and a caring presence

## 2021-06-16 NOTE — TOC Initial Note (Signed)
Transition of Care [facility]) - Initial/Assessment Note    Patient Details  Name: Ashley Camacho MRN: [id number] Date of Birth: 04/24/1991  Transition of [facility]) CM/SW Contact:    Candie Chroman, LCSW Phone Number: 06/16/2021, 12:30 PM  Clinical Narrative:    Readmission prevention screen complete. CSW met with patient. No supports at bedside. CSW introduced role and explained that discharge planning would be discussed. Patient initially thought [place] came by to talk with her about the visual hallucinations she had today. RN is aware. Patient is not active with a PCP. She primarily follows up with her endocrinologist. She confirmed [facility] is assigned to her but she does not want to go there so she plans on completing paperwork with DSS to change her provider. Patient does not have transportation to appointments but knows she needs to complete assessment for Medicaid Transportation. Encouraged her to go ahead and call them while she is here. No home health or DME use prior to admission. Patient stated she cannot return home with her boyfriend and children. She reports her boyfriend told her not to come back if she goes to the hospital again due to feeling like he is taking care of another child. Patient confirmed her children are in a safe environment with access to food and shelter. She said she could potentially go to the shelter but last time she called them (not this admission) they did not answer the phone. CSW encouraged her to call them again. She said she could google the phone number. She does not think her sister or aunt will let her stay with them and she rarely speaks to her mother. Encouraged her to call them and confirm. Patient has not signed up for food stamps yet. According to [facility] note from 12/30, she was given an application at the beginning of that admission. Patient confirmed she cannot afford her $4 copays due to not having a job. Will follow for this need but if we are  able to provide petty cash, this will not be a long-term solution. No further concerns. CSW encouraged patient to contact CSW as needed. CSW will continue to follow patient for support and facilitate discharge when medically stable.             Expected Discharge Plan: [place] (vs home with family) Barriers to Discharge: Continued Medical Work up   Patient Goals and CMS Choice        Expected Discharge Plan and Services Expected Discharge Plan: [place] (vs home with family)     Post Acute Care Choice: NA Living arrangements for the past 2 months: Single Family Home Expected Discharge Date: 06/15/21                                    Prior Living Arrangements/Services Living arrangements for the past 2 months: Single Family Home Lives with:: Minor Children, Significant Other Patient language and need for interpreter reviewed:: Yes Do you feel safe going back to the place where you live?: Yes      Need for Family Participation in Patient Care: Yes (Comment) Care giver support system in place?: No (comment)   Criminal Activity/Legal Involvement Pertinent to Current Situation/Hospitalization: No - Comment as needed  Activities of Daily Living Home Assistive Devices/Equipment: None ADL Screening (condition at time of admission) Patient's cognitive ability adequate to safely complete daily activities?: Yes Is the patient deaf  or have difficulty hearing?: No Does the patient have difficulty seeing, even when wearing glasses/contacts?: No Does the patient have difficulty concentrating, remembering, or making decisions?: No Patient able to express need for assistance with ADLs?: Yes Does the patient have difficulty dressing or bathing?: No Independently performs ADLs?: Yes (appropriate for developmental age) Does the patient have difficulty walking or climbing stairs?: No Weakness of Legs: None Weakness of Arms/Hands: None  Permission Sought/Granted                   Emotional Assessment Appearance:: Appears stated age Attitude/Demeanor/Rapport: Engaged Affect (typically observed): Accepting, Appropriate, Calm Orientation: : Oriented to Self, Oriented to Place, Oriented to  Time, Oriented to Situation Alcohol / Substance Use: Not Applicable Psych Involvement: No (comment)  Admission diagnosis:  Hyperkalemia [E87.5] Acute adrenal insufficiency (HCC) [E27.40] DKA (diabetic ketoacidosis) ([place]) [E11.10] Transaminitis [R74.01] AKI (acute kidney injury) ([place]) [N17.9] Nausea and vomiting, unspecified vomiting type [R11.2] Patient Active Problem List   Diagnosis Date Noted   Hypoglycemia due to insulin 06/14/2021   DKA (diabetic ketoacidosis) ([place]) 06/14/2021   CKD stage 3 due to type 1 diabetes mellitus ([place]) 06/14/2021   Transaminitis 06/14/2021   Hypoglycemia associated with diabetes ([place]) 06/03/2021   Adrenal insufficiency ([place]) 06/01/2021   DKA, type 1 ([place]) 05/20/2021   Moderate recurrent major depression ([place]) 05/08/2021   Right arm pain 05/07/2021   Rash of hand 05/07/2021   Hyperkalemia 05/06/2021   History of adrenal insufficiency 05/06/2021   CAP (community acquired pneumonia) 05/05/2021   Hypoglycemia 04/27/2021   Postoperative hematoma involving genitourinary system following genitourinary procedure    S/P cesarean section 02/17/2021   Acute esophagitis    Leg edema    History of preterm delivery, currently pregnant    Pre-existing type 1 diabetes mellitus during pregnancy in third trimester    Anemia during pregnancy    Type 1 diabetes mellitus affecting pregnancy in second trimester, antepartum    HFrEF (heart failure with reduced ejection fraction) (HCC)    Acute deep vein thrombosis (DVT) of brachial vein of right upper extremity ([place]) 12/17/2020   Anxiety 12/17/2020   Diabetic retinopathy ([place]) 12/17/2020   Acute on chronic combined systolic and diastolic CHF (congestive heart failure) ([place])    Hyperemesis  12/13/2020   Type 1 diabetes mellitus with hypoglycemia without coma ([place]) 12/13/2020   Sunburn 12/13/2020   Hypothyroidism 12/10/2020   Malnutrition ([place]) 12/10/2020   Vitreous hemorrhage of right eye ([place]) 12/10/2020   Pleural effusion associated with pulmonary infection 11/25/2020   Pneumonia affecting pregnancy 11/24/2020   Diabetes mellitus affecting pregnancy, second trimester 11/24/2020   Edema 11/24/2020   Decreased urine output 11/24/2020   Shortness of breath    Zinc deficiency    SOB (shortness of breath)    Social problem 11/21/2020   Nausea and vomiting during pregnancy prior to [redacted] weeks gestation 11/21/2020   Low serum vitamin B12    Delivery with history of C-section 11/20/2020   Absolute anemia    Acute kidney injury (HCC)    Nausea and vomiting during pregnancy 10/23/2020   History of premature delivery, currently pregnant, second trimester 10/13/2020   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 [place] [address] Phone: [phone] Fax: [phone]  [facility] [address] [place] [address] Phone: [phone] Fax: [phone]     Social Determinants of Health (SDOH) Interventions    Readmission Risk Interventions Readmission Risk Prevention Plan 06/16/2021 06/03/2021 05/22/2021  Transportation Screening Complete Complete Complete  PCP or Specialist Appt within 3-5 Days - - Complete  HRI or [place] - - Complete  Social Work Consult for  [place] Planning/Counseling - - Complete  Palliative Care Screening - - Not Applicable  Medication Review Press photographer) Complete Complete Complete  PCP or Specialist appointment within 3-5 days of discharge Complete Complete -  [place] or [place] - Not Complete -  [place] or Home Care Consult Pt Refusal Comments - NA -  SW Recovery Care/Counseling Consult Complete Complete -  Palliative Care Screening Not Applicable Not Applicable -  [place] Not Applicable Not Applicable -  Some recent data might be hidden

## 2021-06-16 NOTE — Progress Notes (Addendum)
Progress Note    Ashley Camacho  [id number] DOB: [birth year]  DOA: 06/14/2021 PCP: Pcp, No      Brief Narrative:    Medical records reviewed and are as summarized below:  Ashley Camacho is a 31 y.o. 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  She presented to the hospital again on 06/14/2021 because of nausea and vomiting.  She was found to have DKA, AKI and elevated liver enzymes.      Assessment/Plan:   Principal Problem:   DKA (diabetic ketoacidosis) ([place]) Active Problems:   Acute kidney injury ([place])   Diabetic gastroparesis ([place])   Adrenal insufficiency ([place])   CKD stage 3 due to type 1 diabetes mellitus (HCC)   Transaminitis   Body mass index is 26.16 kg/m.   DKA, type I DM with hyperglycemia: DKA has resolved.  Continue low-dose insulin glargine and NovoLog.  Monitor glucose levels closely because she is at risk for hypoglycemia.  AKI on CKD stage IIIa: Creatinine is trending upwards.  Patient had requested and IV fluids be discontinued yesterday.  Discussed the importance of restarting IV fluids because of worsening kidney disease.  She is agreeable to restart IV fluids.  Use Ringer's lactate infusion for AKI.  Diabetic gastroparesis: Continue IV erythromycin and antiemetics as needed for nausea/vomiting  Elevated liver enzymes/acute hepatitis: Improving.  GI has signed off.  Adrenal insufficiency: Switch IV to oral hydrocortisone  Valvular heart disease (moderate mitral regurgitation, moderate pulmonary regurgitation):  Compensated  Depression, anxiety: Restart Remeron.  Use Xanax as needed for anxiety.  Of note, patient had previously stated she lives at home with her boyfriend.  She said she cannot afford her medicines because her husband has to make car payment.  However, today she opened up to her nurse, Daquana.  She said she is homeless.  Consult TOC to assist with homelessness and medications.    Diet Order             Diet Carb Modified Fluid consistency: Thin; Room service appropriate? Yes  Diet effective now                      Consultants: Gastroenterologist  Procedures: None    Medications:    ALPRAZolam  0.25 mg Oral Once   Chlorhexidine Gluconate Cloth  6 each Topical Q0600   erythromycin  250 mg Oral Q6H   famotidine  20 mg Oral BID   feeding supplement (GLUCERNA SHAKE)  237 mL Oral TID BM   hydrocortisone  10 mg Oral q1600   [START ON 06/17/2021] hydrocortisone  20 mg Oral Q breakfast   insulin aspart  0-6 Units Subcutaneous TID WC   insulin glargine-yfgn  4 Units Subcutaneous BID   levothyroxine  25 mcg Oral Q0600   mirtazapine  15 mg Oral QHS   multivitamin with minerals  1 tablet Oral Daily   Continuous Infusions:  lactated ringers 50 mL/hr at 06/16/21 1026   promethazine (PHENERGAN) injection (IM or IVPB) 12.5 mg (06/15/21 2121)     Anti-infectives (From admission, onward)    Start     Dose/Rate Route Frequency Ordered Stop   06/15/21 1200  erythromycin (ERY-TAB) EC tablet 250 mg  250 mg Oral Every 6 hours 06/15/21 0825                Family Communication/Anticipated D/C date and plan/Code Status   DVT prophylaxis: SCDs Start: 06/14/21 1512     Code Status: Full Code  Family Communication: None Disposition Plan: Plan to discharge home in 2 to 3 days   Status is: Inpatient  Remains inpatient appropriate because: DKA, monitor glucose and liver enzymes          Subjective:   Interval events noted.  She complains of  nausea but no vomiting.  She vomited yesterday.  She is a little confused.  She kept saying "I can't see the building".  She also complains of anxiety.  Objective:    Vitals:   06/15/21 2005 06/16/21 0500 06/16/21 0519 06/16/21 0834  BP: (!) 121/91  115/87 (!) 119/91  Pulse: 95  76 95  Resp: 20  20 18   Temp: 98.2 F (36.8 C)  97.8 F (36.6 C) 98.9 F (37.2 C)  TempSrc: Oral  Oral Oral  SpO2: 99%  99% 100%  Weight:  62.8 kg    Height:       No data found.   Intake/Output Summary (Last 24 hours) at 06/16/2021 1047 Last data filed at 06/16/2021 0837 Gross per 24 hour  Intake 1811.31 ml  Output 300 ml  Net 1511.31 ml   Filed Weights   06/14/21 1805 06/15/21 0500 06/16/21 0500  Weight: 58.9 kg 63.9 kg 62.8 kg    Exam:  GEN: NAD SKIN: No rash EYES: EOMI ENT: MMM CV: RRR PULM: CTA B ABD: soft, ND, NT, +BS CNS: AAO x 3, non focal EXT: No edema or tenderness         Data Reviewed:   I have personally reviewed following labs and imaging studies:  Labs: Labs show the following:   Basic Metabolic Panel: Recent Labs  Lab 06/14/21 1003 06/14/21 1004 06/14/21 2337 06/15/21 0342 06/15/21 0728 06/16/21 0504  NA 141  --  142 141  --  140  K 5.2*  --  3.9 3.6  --  4.4  CL 108  --  108 109  --  106  CO2 13*  --  25 26  --  27  GLUCOSE 187*  --  148* 152*  --  192*  BUN 27*  --  30* 30*  --  35*  CREATININE 1.80*  --  1.98* 1.97*  --  2.31*  CALCIUM 8.5*  --  8.2* 8.1*  --  8.0*  MG  --  2.3  --   --  1.9 2.1  PHOS  --   --   --   --  2.7 3.8   GFR Estimated Creatinine Clearance: 30.2 mL/min (A) (by C-G formula based on SCr of 2.31 mg/dL (H)). Liver Function Tests: Recent Labs  Lab 06/14/21 1003 06/14/21 2337 06/15/21 0342 06/16/21 0504  AST 1,648* 675* 561* 353*  ALT 554* 440* 426* 390*  ALKPHOS 202* 159* 159* 196*  BILITOT 1.7* 1.0 0.9 0.7  PROT 6.6 5.2* 5.3* 5.2*  ALBUMIN 3.5 2.9* 2.8* 3.0*   Recent Labs  Lab 06/14/21 1003  LIPASE 25    No results for input(s): AMMONIA in the last 168 hours. Coagulation profile Recent Labs  Lab 06/14/21 1202 06/15/21 0342  INR 2.2* 1.9*    CBC: Recent Labs  Lab 06/14/21 1003 06/15/21 0342 06/16/21 0504  WBC 8.0 4.0 8.6  NEUTROABS  --   --  8.0*  HGB 11.6* 10.5* 10.4*  HCT 38.6 33.2* 33.1*  MCV 100.0 96.5 99.4  PLT 114* 94* 95*   Cardiac Enzymes: Recent Labs  Lab 06/14/21 1202  CKTOTAL 218   BNP (last 3 results) No results for input(s): PROBNP in the last 8760 hours. CBG: Recent Labs  Lab 06/15/21 0830 06/15/21 0930 06/15/21 1702 06/15/21 2035 06/16/21 0836  GLUCAP 105* 117* 133* 222* 169*   D-Dimer: No results for input(s): DDIMER in the last 72 hours. Hgb A1c: No results for input(s): HGBA1C in the last 72 hours. Lipid Profile: No results for input(s): CHOL, HDL, LDLCALC, TRIG, CHOLHDL, LDLDIRECT in the last 72 hours. Thyroid function studies: No results for input(s): TSH, T4TOTAL, T3FREE, THYROIDAB in the last 72 hours.  Invalid input(s): FREET3 Anemia work up: Recent Labs    06/15/21 0342  FERRITIN 3,657*  TIBC 126*  IRON 77   Sepsis Labs: Recent Labs  Lab 06/14/21 1003 06/14/21 1202 06/15/21 0342 06/16/21 0504  WBC 8.0  --  4.0 8.6  LATICACIDVEN  --  2.4*  --   --     Microbiology Recent Results (from the past 240 hour(s))  Resp Panel by RT-PCR (Flu A&B, Covid) Nasopharyngeal Swab     Status: None   Collection Time: 06/14/21 10:27 AM   Specimen: Nasopharyngeal Swab; Nasopharyngeal(NP) swabs in vial transport medium  Result Value Ref Range Status   SARS Coronavirus 2 by RT PCR NEGATIVE NEGATIVE Final    Comment: (NOTE) SARS-CoV-2 target nucleic acids are NOT DETECTED.  The SARS-CoV-2 RNA is generally detectable in upper respiratory specimens during the acute phase of infection. The lowest concentration of SARS-CoV-2 viral copies this assay can detect is 138 copies/mL. A negative result does not preclude SARS-Cov-2 infection  and should not be used as the sole basis for treatment or other patient management decisions. A negative result may occur with  improper specimen collection/handling, submission of specimen other than nasopharyngeal swab, presence of viral mutation(s) within the areas targeted by this assay, and inadequate number of viral copies(<138 copies/mL). A negative result must be combined with clinical observations, patient history, and epidemiological information. The expected result is Negative.  Fact Sheet for Patients:  EntrepreneurPulse.com.au  Fact Sheet for Healthcare Providers:  IncredibleEmployment.be  This test is no t yet approved or cleared by the [place] FDA and  has been authorized for detection and/or diagnosis of SARS-CoV-2 by FDA under an Emergency Use Authorization (EUA). This EUA will remain  in effect (meaning this test can be used) for the duration of the COVID-19 declaration under Section 564(b)(1) of the Act, 21 U.S.C.section 360bbb-3(b)(1), unless the authorization is terminated  or revoked sooner.       Influenza A by PCR NEGATIVE NEGATIVE Final   Influenza B by PCR NEGATIVE NEGATIVE Final    Comment: (NOTE) The Xpert Xpress SARS-CoV-2/FLU/RSV plus assay is intended as an aid in the diagnosis of influenza from Nasopharyngeal swab specimens and should not be used as a sole basis for treatment. Nasal washings and aspirates are unacceptable for Xpert Xpress SARS-CoV-2/FLU/RSV testing.  Fact Sheet for Patients: EntrepreneurPulse.com.au  Fact Sheet for Healthcare Providers: IncredibleEmployment.be  This test is not yet approved or cleared by the [place] FDA and has been authorized for detection and/or diagnosis of SARS-CoV-2 by FDA under an Emergency Use Authorization (EUA). This EUA will remain in effect (meaning this test can be used) for the duration of the COVID-19 declaration  under Section 564(b)(1) of the Act,  21 U.S.C. section 360bbb-3(b)(1), unless the authorization is terminated or revoked.  Performed at [facility], [address] [place], [place] [address]   MRSA Next Gen by PCR, Nasal     Status: None   Collection Time: 06/14/21  6:08 PM   Specimen: Nasal Mucosa; Nasal Swab  Result Value Ref Range Status   MRSA by PCR Next Gen NOT DETECTED NOT DETECTED Final    Comment: (NOTE) The GeneXpert MRSA Assay (FDA approved for NASAL specimens only), is one component of a comprehensive MRSA colonization surveillance program. It is not intended to diagnose MRSA infection nor to guide or monitor treatment for MRSA infections. Test performance is not FDA approved in patients less than 28 years old. Performed at [facility], [address] [place], [place] [address]     Procedures and diagnostic studies:  CT ABDOMEN PELVIS WO CONTRAST  Result Date: 06/14/2021 CLINICAL DATA:  Diabetic presenting from home with vomiting for 2-3 days. EXAM: CT ABDOMEN AND PELVIS WITHOUT CONTRAST TECHNIQUE: Multidetector CT imaging of the abdomen and pelvis was performed following the standard protocol without IV contrast. RADIATION DOSE REDUCTION: This exam was performed according to the departmental dose-optimization program which includes automated exposure control, adjustment of the mA and/or kV according to patient size and/or use of iterative reconstruction technique. COMPARISON:  02/23/2021 FINDINGS: Lower chest: Moderate to large right pleural effusion. Small to moderate left pleural effusion noted. Hepatobiliary: No focal liver abnormality. There is mild diffuse gallbladder wall edema. Stones are noted layering within the dependent portion of the gallbladder. No signs of bile duct dilatation. Pancreas: Unremarkable. No pancreatic ductal dilatation or surrounding inflammatory changes. Spleen: Normal in size without focal abnormality. Adrenals/Urinary  Tract: Normal appearance of the adrenal glands. The left kidney is unremarkable. Right-sided pelvocaliectasis is identified. No urinary tract calculi identified bilaterally. Bladder appears normal. Stomach/Bowel: Stomach appears normal. The appendix is visualized and is within normal limits. No bowel wall thickening, inflammation or distension identified. Vascular/Lymphatic: No significant vascular findings are present. No enlarged abdominal or pelvic lymph nodes. Reproductive: Uterus and bilateral adnexa are unremarkable. Other: Interval increase in abdominopelvic ascites. Musculoskeletal: Previously noted ventral abdominal wall fluid collection/hematoma has decreased in size compared with 02/23/2021. On today's study this measures 5.2 x 2.3 by 5.9 cm. Previously this measured 11.8 by 11.0 by 14.4 cm. There is marked diffuse body wall edema compatible with anasarca. 2 no acute or suspicious osseous findings. IMPRESSION: 1. Marked diffuse body wall edema compatible with anasarca. 2. Interval increase in abdominopelvic ascites. 3. Decrease in size of ventral abdominal wall fluid collection/hematoma. 4. Bilateral pleural effusions, right greater than left. 5. Gallstones with mild gallbladder wall edema. Findings are nonspecific in the setting of ascites. If there is a clinical concern for acute cholecystitis consider further evaluation with right upper quadrant sonogram. 6. Right-sided pelvocaliectasis. No urinary tract calculi identified. Electronically Signed   By: Kerby Moors M.D.   On: 06/14/2021 14:37   US ABDOMEN LIMITED RUQ (LIVER/GB)  Result Date: 06/14/2021 CLINICAL DATA:  Right upper quadrant pain 3 days.  Transaminitis. EXAM: ULTRASOUND ABDOMEN LIMITED RIGHT UPPER QUADRANT COMPARISON:  CT abdomen pelvis 02/23/2021 FINDINGS: Gallbladder: Multiple gallstones up to 8 mm in diameter. Negative sonographic Murphy sign. Fluid around the gallbladder may be pericholecystic fluid versus ascites. Common bile  duct: Diameter: 3 mm Liver: No focal lesion identified. Within normal limits in parenchymal echogenicity. Portal vein is patent on color Doppler imaging with normal direction of blood flow towards the  liver. Other: Small amount of ascites Right pleural effusion IMPRESSION: Cholelithiasis without   biliary dilatation Ascites Right pleural effusion Electronically Signed   By: Franchot Gallo M.D.   On: 06/14/2021 13:25               LOS: 2 days   Ovella Manygoats  [facility]   Pager on [email or web address]. If 7PM-7AM, please contact night-coverage at [email or web address]     06/16/2021, 10:47 AM

## 2021-06-16 NOTE — Progress Notes (Signed)
Entered patient's room at request of bedside RN to further evaluation after patient reported she was "seeing things" and "scared to be alone." Patient known from previous admissions. When asked what was going on, patient replied "I'm seeing people that aren't there!" Patient further elaborated she "just knew" they weren't there and they were telling her if she "didn't see the third person" she "was crazy."  Patient tearful as she continued to talk and stated, "I don't want locked up. I don't want to lose my children." When asked why she thought she would lose her children, patient stated "because of seeing things and being in the hospital sick again." Patient has two children, one of which is 62 months old. Patient endorses she has struggled with some depression and anxiety since prior to child's birth. Child has "colic" and is "difficult."  Patient has had multiple hospitalizations since becoming pregnant, putting a huge strain on her relationship with her husband. Patient endorses "probably needing" to speak to a therapist or mental health practitioner but also says she "fears they will take my children."  Towards the end of discussion, patient noted to be falling asleep rapidly mid sentence.  Bedside RN updated on all of the above findings who then notified MD.

## 2021-06-17 DIAGNOSIS — E2740 Unspecified adrenocortical insufficiency: Secondary | ICD-10-CM | POA: Diagnosis not present

## 2021-06-17 DIAGNOSIS — K3184 Gastroparesis: Secondary | ICD-10-CM | POA: Diagnosis not present

## 2021-06-17 DIAGNOSIS — F32A Depression, unspecified: Secondary | ICD-10-CM

## 2021-06-17 DIAGNOSIS — E1010 Type 1 diabetes mellitus with ketoacidosis without coma: Secondary | ICD-10-CM | POA: Diagnosis not present

## 2021-06-17 DIAGNOSIS — N179 Acute kidney failure, unspecified: Secondary | ICD-10-CM | POA: Diagnosis not present

## 2021-06-17 DIAGNOSIS — E1143 Type 2 diabetes mellitus with diabetic autonomic (poly)neuropathy: Secondary | ICD-10-CM | POA: Diagnosis not present

## 2021-06-17 LAB — CBC WITH DIFFERENTIAL/PLATELET
Abs Immature Granulocytes: 0.03 10*3/uL (ref 0.00–0.07)
Basophils Absolute: 0 10*3/uL (ref 0.0–0.1)
Basophils Relative: 0 %
Eosinophils Absolute: 0 10*3/uL (ref 0.0–0.5)
Eosinophils Relative: 0 %
HCT: 34.6 % — ABNORMAL LOW (ref 36.0–46.0)
Hemoglobin: 10.6 g/dL — ABNORMAL LOW (ref 12.0–15.0)
Immature Granulocytes: 0 %
Lymphocytes Relative: 8 %
Lymphs Abs: 0.6 10*3/uL — ABNORMAL LOW (ref 0.7–4.0)
MCH: 30.8 pg (ref 26.0–34.0)
MCHC: 30.6 g/dL (ref 30.0–36.0)
MCV: 100.6 fL — ABNORMAL HIGH (ref 80.0–100.0)
Monocytes Absolute: 0.2 10*3/uL (ref 0.1–1.0)
Monocytes Relative: 3 %
Neutro Abs: 6.9 10*3/uL (ref 1.7–7.7)
Neutrophils Relative %: 89 %
Platelets: 99 10*3/uL — ABNORMAL LOW (ref 150–400)
RBC: 3.44 MIL/uL — ABNORMAL LOW (ref 3.87–5.11)
RDW: 14.8 % (ref 11.5–15.5)
WBC: 7.7 10*3/uL (ref 4.0–10.5)
nRBC: 0.3 % — ABNORMAL HIGH (ref 0.0–0.2)

## 2021-06-17 LAB — HSV(HERPES SIMPLEX VRS) I + II AB-IGG
HSV 1 Glycoprotein G Ab, IgG: <0.91 index (ref 0.00–0.90)
HSV 2 Glycoprotein G Ab, IgG: <0.91 index (ref 0.00–0.90)

## 2021-06-17 LAB — GLUCOSE, CAPILLARY
Glucose-Capillary: 202 mg/dL — ABNORMAL HIGH (ref 70–99)
Glucose-Capillary: 212 mg/dL — ABNORMAL HIGH (ref 70–99)
Glucose-Capillary: 173 mg/dL — ABNORMAL HIGH (ref 70–99)
Glucose-Capillary: 150 mg/dL — ABNORMAL HIGH (ref 70–99)
Glucose-Capillary: 116 mg/dL — ABNORMAL HIGH (ref 70–99)

## 2021-06-17 LAB — COMPREHENSIVE METABOLIC PANEL
ALT: 402 U/L — ABNORMAL HIGH (ref 0–44)
AST: 324 U/L — ABNORMAL HIGH (ref 15–41)
Albumin: 2.9 g/dL — ABNORMAL LOW (ref 3.5–5.0)
Alkaline Phosphatase: 233 U/L — ABNORMAL HIGH (ref 38–126)
Anion gap: 9 (ref 5–15)
BUN: 42 mg/dL — ABNORMAL HIGH (ref 6–20)
CO2: 26 mmol/L (ref 22–32)
Calcium: 8 mg/dL — ABNORMAL LOW (ref 8.9–10.3)
Chloride: 105 mmol/L (ref 98–111)
Creatinine, Ser: 2.51 mg/dL — ABNORMAL HIGH (ref 0.44–1.00)
GFR, Estimated: 26 mL/min — ABNORMAL LOW (ref >60)
Glucose, Bld: 223 mg/dL — ABNORMAL HIGH (ref 70–99)
Potassium: 5 mmol/L (ref 3.5–5.1)
Sodium: 140 mmol/L (ref 135–145)
Total Bilirubin: 0.6 mg/dL (ref 0.3–1.2)
Total Protein: 5 g/dL — ABNORMAL LOW (ref 6.5–8.1)

## 2021-06-17 LAB — CMV IGM: CMV IgM: <30 AU/mL (ref 0.0–29.9)

## 2021-06-17 LAB — CMV DNA, QUANTITATIVE, PCR
CMV DNA Quant: NEGATIVE IU/mL
Log10 CMV Qn DNA Pl: UNDETERMINED log10 IU/mL

## 2021-06-17 LAB — PHOSPHORUS: Phosphorus: 3.9 mg/dL (ref 2.5–4.6)

## 2021-06-17 LAB — MAGNESIUM: Magnesium: 2.2 mg/dL (ref 1.7–2.4)

## 2021-06-17 MED ORDER — FAMOTIDINE 20 MG PO TABS
20.0000 mg | ORAL_TABLET | Freq: Every day | ORAL | Status: DC
  Administered 2021-06-18 – 2021-06-19 (×2): 20 mg via ORAL
  Filled 2021-06-17 (×2): qty 1

## 2021-06-17 MED ORDER — MIRTAZAPINE 15 MG PO TABS
30.0000 mg | ORAL_TABLET | Freq: Every day | ORAL | Status: DC
  Administered 2021-06-17 – 2021-06-18 (×2): 30 mg via ORAL
  Filled 2021-06-17 (×2): qty 2

## 2021-06-17 MED ORDER — GUAIFENESIN-DM 100-10 MG/5ML PO SYRP
5.0000 mL | ORAL_SOLUTION | ORAL | Status: DC | PRN
  Administered 2021-06-17 – 2021-06-18 (×3): 5 mL via ORAL
  Filled 2021-06-17 (×3): qty 5

## 2021-06-17 NOTE — Plan of Care (Signed)
°  Problem: Health Behavior/Discharge Planning: Goal: Ability to manage health-related needs will improve Outcome: Progressing   Problem: Clinical Measurements: Goal: Ability to maintain clinical measurements within normal limits will improve Outcome: Progressing   Problem: Activity: Goal: Risk for activity intolerance will decrease Outcome: Progressing   Problem: Elimination: Goal: Will not experience complications related to urinary retention Outcome: Progressing   Problem: Safety: Goal: Ability to remain free from injury will improve Outcome: Progressing
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
## 2021-06-17 NOTE — Progress Notes (Addendum)
Progress Note    Ashley Camacho  [id number] DOB: 1991/04/27  DOA: 06/14/2021 PCP: Pcp, No      Brief Narrative:    Medical records reviewed and are as summarized below:  Ashley Camacho is a 31 y.o. 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  She presented to the hospital again on 06/14/2021 because of nausea and vomiting.  She was found to have DKA, AKI and elevated liver enzymes.      Assessment/Plan:   Principal Problem:   Depressive disorder Active Problems:   Acute kidney injury ([place])   Diabetic gastroparesis (H[facility])   Adrenal insufficiency (HCC)   DKA (diabetic ketoacidosis) ([place])   CKD stage 3 due to type 1 diabetes mellitus (HCC)   Transaminitis   Body mass index is 25.33 kg/m.   DKA, type I DM with hyperglycemia: DKA has resolved.  Continue low-dose glargine and NovoLog as needed.  Monitor glucose levels closely because she is at risk for hypoglycemia.  AKI on CKD stage IIIa: Creatinine is trending upward.  Continue IV fluids for hydration.  Diabetic gastroparesis: Continue IV erythromycin and antiemetics as needed for nausea/vomiting  Elevated liver enzymes/acute hepatitis: Improving GI has signed off.  Adrenal insufficiency: Continue hydrocortisone  Thrombocytopenia: Monitor CBC  Valvular heart disease (moderate mitral regurgitation, moderate pulmonary regurgitation): Compensated  Depression, anxiety, hallucinations: Continue Remeron and Xanax as needed.  Consulted psychiatrist to assist with management.      Homelessness: Consulted  Education officer, museum to assist with medications and disposition.    Diet Order             Diet Carb Modified Fluid consistency: Thin; Room service appropriate? Yes  Diet effective now                      Consultants: Gastroenterologist Psychiatrist  Procedures: None    Medications:    Chlorhexidine Gluconate Cloth  6 each Topical Q0600   erythromycin  250 mg Oral Q6H   famotidine  20 mg Oral BID   feeding supplement (GLUCERNA SHAKE)  237 mL Oral TID BM   hydrocortisone  10 mg Oral q1600   hydrocortisone  20 mg Oral Q breakfast   insulin aspart  0-6 Units Subcutaneous TID WC   insulin glargine-yfgn  4 Units Subcutaneous BID   levothyroxine  25 mcg Oral Q0600   mirtazapine  15 mg Oral QHS   multivitamin with minerals  1 tablet Oral Daily   Continuous Infusions:  lactated ringers 50 mL/hr at 06/17/21 0450   promethazine (PHENERGAN) injection (IM or IVPB) 12.5 mg (06/15/21 2121)     Anti-infectives (From admission, onward)    Start     Dose/Rate Route Frequency Ordered Stop   06/15/21 1200  erythromycin (ERY-TAB) EC tablet 250 mg        250 mg Oral Every 6 hours 06/15/21 0825                Family Communication/Anticipated D/C date and plan/Code Status   DVT prophylaxis: SCDs Start: 06/14/21 1512     Code Status: Full Code  Family Communication: None Disposition Plan: Plan to discharge home in 1 to 2 days   Status is: Inpatient  Remains inpatient  appropriate because: DKA, monitor glucose and liver enzymes          Subjective:   Interval events noted.  No nausea or vomiting today.  She said she vomited yesterday.  She is feeling anxious and depressed.  She reported the hallucinations she had yesterday and she is concerned about this.  Objective:    Vitals:   06/17/21 0438 06/17/21 0448 06/17/21 0800 06/17/21 1138  BP:  112/90 107/85 109/85  Pulse:  74 72 81  Resp:  20 18 19   Temp:  97.7 F (36.5 C) 97.7 F (36.5 C) (!) 97.4 F  (36.3 C)  TempSrc:  Oral Oral Axillary  SpO2:  98% 98%   Weight: 60.8 kg     Height:       No data found.   Intake/Output Summary (Last 24 hours) at 06/17/2021 1316 Last data filed at 06/17/2021 0600 Gross per 24 hour  Intake 1317.52 ml  Output 200 ml  Net 1117.52 ml   Filed Weights   06/15/21 0500 06/16/21 0500 06/17/21 0438  Weight: 63.9 kg 62.8 kg 60.8 kg    Exam:  GEN: NAD SKIN: No rash EYES: EOMI ENT: MMM CV: RRR PULM: CTA B ABD: soft, ND, NT, +BS CNS: AAO x 3, non focal EXT: No edema or tenderness          Data Reviewed:   I have personally reviewed following labs and imaging studies:  Labs: Labs show the following:   Basic Metabolic Panel: Recent Labs  Lab 06/14/21 1003 06/14/21 1004 06/14/21 2337 06/15/21 0342 06/15/21 0728 06/16/21 0504 06/17/21 0426  NA 141  --  142 141  --  140 140  K 5.2*  --  3.9 3.6  --  4.4 5.0  CL 108  --  108 109  --  106 105  CO2 13*  --  25 26  --  27 26  GLUCOSE 187*  --  148* 152*  --  192* 223*  BUN 27*  --  30* 30*  --  35* 42*  CREATININE 1.80*  --  1.98* 1.97*  --  2.31* 2.51*  CALCIUM 8.5*  --  8.2* 8.1*  --  8.0* 8.0*  MG  --  2.3  --   --  1.9 2.1 2.2  PHOS  --   --   --   --  2.7 3.8 3.9   GFR Estimated Creatinine Clearance: 27.4 mL/min (A) (by C-G formula based on SCr of 2.51 mg/dL (H)). Liver Function Tests: Recent Labs  Lab 06/14/21 1003 06/14/21 2337 06/15/21 0342 06/16/21 0504 06/17/21 0426  AST 1,648* 675* 561* 353* 324*  ALT 554* 440* 426* 390* 402*  ALKPHOS 202* 159* 159* 196* 233*  BILITOT 1.7* 1.0 0.9 0.7 0.6  PROT 6.6 5.2* 5.3* 5.2* 5.0*  ALBUMIN 3.5 2.9* 2.8* 3.0* 2.9*   Recent Labs  Lab 06/14/21 1003  LIPASE 25   No results for input(s): AMMONIA in the last 168 hours. Coagulation profile Recent Labs  Lab 06/14/21 1202 06/15/21 0342  INR 2.2* 1.9*    CBC: Recent Labs  Lab 06/14/21 1003 06/15/21 0342 06/16/21 0504 06/17/21 0426  WBC 8.0 4.0 8.6 7.7   NEUTROABS  --   --  8.0* 6.9  HGB 11.6* 10.5* 10.4* 10.6*  HCT 38.6 33.2* 33.1* 34.6*  MCV 100.0 96.5 99.4 100.6*  PLT 114* 94* 95* 99*   Cardiac Enzymes: Recent Labs  Lab 06/14/21 1202  CKTOTAL 218   BNP (last  3 results) No results for input(s): PROBNP in the last 8760 hours. CBG: Recent Labs  Lab 06/16/21 1149 06/16/21 1640 06/16/21 2156 06/17/21 0831 06/17/21 1134  GLUCAP 184* 133* 173* 202* 212*   D-Dimer: No results for input(s): DDIMER in the last 72 hours. Hgb A1c: No results for input(s): HGBA1C in the last 72 hours. Lipid Profile: No results for input(s): CHOL, HDL, LDLCALC, TRIG, CHOLHDL, LDLDIRECT in the last 72 hours. Thyroid function studies: No results for input(s): TSH, T4TOTAL, T3FREE, THYROIDAB in the last 72 hours.  Invalid input(s): FREET3 Anemia work up: Recent Labs    06/15/21 0342  FERRITIN 3,657*  TIBC 126*  IRON 77   Sepsis Labs: Recent Labs  Lab 06/14/21 1003 06/14/21 1202 06/15/21 0342 06/16/21 0504 06/17/21 0426  WBC 8.0  --  4.0 8.6 7.7  LATICACIDVEN  --  2.4*  --   --   --     Microbiology Recent Results (from the past 240 hour(s))  Resp Panel by RT-PCR (Flu A&B, Covid) Nasopharyngeal Swab     Status: None   Collection Time: 06/14/21 10:27 AM   Specimen: Nasopharyngeal Swab; Nasopharyngeal(NP) swabs in vial transport medium  Result Value Ref Range Status   SARS Coronavirus 2 by RT PCR NEGATIVE NEGATIVE Final    Comment: (NOTE) SARS-CoV-2 target nucleic acids are NOT DETECTED.  The SARS-CoV-2 RNA is generally detectable in upper respiratory specimens during the acute phase of infection. The lowest concentration of SARS-CoV-2 viral copies this assay can detect is 138 copies/mL. A negative result does not preclude SARS-Cov-2 infection and should not be used as the sole basis for treatment or other patient management decisions. A negative result may occur with  improper specimen collection/handling, submission of  specimen other than nasopharyngeal swab, presence of viral mutation(s) within the areas targeted by this assay, and inadequate number of viral copies(<138 copies/mL). A negative result must be combined with clinical observations, patient history, and epidemiological information. The expected result is Negative.  Fact Sheet for Patients:  BloggerCourse.com  Fact Sheet for Healthcare Providers:  SeriousBroker.it  This test is no t yet approved or cleared by the [place] FDA and  has been authorized for detection and/or diagnosis of SARS-CoV-2 by FDA under an Emergency Use Authorization (EUA). This EUA will remain  in effect (meaning this test can be used) for the duration of the COVID-19 declaration under Section 564(b)(1) of the Act, 21 U.S.C.section 360bbb-3(b)(1), unless the authorization is terminated  or revoked sooner.       Influenza A by PCR NEGATIVE NEGATIVE Final   Influenza B by PCR NEGATIVE NEGATIVE Final    Comment: (NOTE) The Xpert Xpress SARS-CoV-2/FLU/RSV plus assay is intended as an aid in the diagnosis of influenza from Nasopharyngeal swab specimens and should not be used as a sole basis for treatment. Nasal washings and aspirates are unacceptable for Xpert Xpress SARS-CoV-2/FLU/RSV testing.  Fact Sheet for Patients: BloggerCourse.com  Fact Sheet for Healthcare Providers: SeriousBroker.it  This test is not yet approved or cleared by the [place] FDA and has been authorized for detection and/or diagnosis of SARS-CoV-2 by FDA under an Emergency Use Authorization (EUA). This EUA will remain in effect (meaning this test can be used) for the duration of the COVID-19 declaration under Section 564(b)(1) of the Act, 21 U.S.C. section 360bbb-3(b)(1), unless the authorization is terminated or revoked.  Performed at [facility], [address] [place], [place] [address]   MRSA Next Gen by PCR, Nasal  Status: None   Collection Time: 06/14/21  6:08 PM   Specimen: Nasal Mucosa; Nasal Swab  Result Value Ref Range Status   MRSA by PCR Next Gen NOT DETECTED NOT DETECTED Final    Comment: (NOTE) The GeneXpert MRSA Assay (FDA approved for NASAL specimens only), is one component of a comprehensive MRSA colonization surveillance program. It is not intended to diagnose MRSA infection nor to guide or monitor treatment for MRSA infections. Test performance is not FDA approved in patients less than 65 years old. Performed at [facility], [address] [place], [place] [address]     Procedures and diagnostic studies:  No results found.             LOS: 3 days   Jeanice Dempsey  [facility]   Pager on [email or web address]. If 7PM-7AM, please contact night-coverage at [email or web address]     06/17/2021, 1:16 PM

## 2021-06-18 DIAGNOSIS — K3184 Gastroparesis: Secondary | ICD-10-CM | POA: Diagnosis not present

## 2021-06-18 DIAGNOSIS — F32A Depression, unspecified: Secondary | ICD-10-CM | POA: Diagnosis not present

## 2021-06-18 DIAGNOSIS — E1143 Type 2 diabetes mellitus with diabetic autonomic (poly)neuropathy: Secondary | ICD-10-CM | POA: Diagnosis not present

## 2021-06-18 DIAGNOSIS — E1010 Type 1 diabetes mellitus with ketoacidosis without coma: Secondary | ICD-10-CM | POA: Diagnosis not present

## 2021-06-18 DIAGNOSIS — N179 Acute kidney failure, unspecified: Secondary | ICD-10-CM | POA: Diagnosis not present

## 2021-06-18 DIAGNOSIS — E2740 Unspecified adrenocortical insufficiency: Secondary | ICD-10-CM | POA: Diagnosis not present

## 2021-06-18 LAB — CBC WITH DIFFERENTIAL/PLATELET
Abs Immature Granulocytes: 0.01 10*3/uL (ref 0.00–0.07)
Basophils Absolute: 0 10*3/uL (ref 0.0–0.1)
Basophils Relative: 0 %
Eosinophils Absolute: 0 10*3/uL (ref 0.0–0.5)
Eosinophils Relative: 0 %
HCT: 36.2 % (ref 36.0–46.0)
Hemoglobin: 11.1 g/dL — ABNORMAL LOW (ref 12.0–15.0)
Immature Granulocytes: 0 %
Lymphocytes Relative: 16 %
Lymphs Abs: 1.2 10*3/uL (ref 0.7–4.0)
MCH: 30.6 pg (ref 26.0–34.0)
MCHC: 30.7 g/dL (ref 30.0–36.0)
MCV: 99.7 fL (ref 80.0–100.0)
Monocytes Absolute: 0.4 10*3/uL (ref 0.1–1.0)
Monocytes Relative: 5 %
Neutro Abs: 5.9 10*3/uL (ref 1.7–7.7)
Neutrophils Relative %: 79 %
Platelets: 96 10*3/uL — ABNORMAL LOW (ref 150–400)
RBC: 3.63 MIL/uL — ABNORMAL LOW (ref 3.87–5.11)
RDW: 14.8 % (ref 11.5–15.5)
WBC: 7.5 10*3/uL (ref 4.0–10.5)
nRBC: 0.3 % — ABNORMAL HIGH (ref 0.0–0.2)

## 2021-06-18 LAB — COMPREHENSIVE METABOLIC PANEL
ALT: 344 U/L — ABNORMAL HIGH (ref 0–44)
AST: 184 U/L — ABNORMAL HIGH (ref 15–41)
Albumin: 2.9 g/dL — ABNORMAL LOW (ref 3.5–5.0)
Alkaline Phosphatase: 271 U/L — ABNORMAL HIGH (ref 38–126)
Anion gap: 7 (ref 5–15)
BUN: 42 mg/dL — ABNORMAL HIGH (ref 6–20)
CO2: 25 mmol/L (ref 22–32)
Calcium: 8 mg/dL — ABNORMAL LOW (ref 8.9–10.3)
Chloride: 106 mmol/L (ref 98–111)
Creatinine, Ser: 2.07 mg/dL — ABNORMAL HIGH (ref 0.44–1.00)
GFR, Estimated: 32 mL/min — ABNORMAL LOW (ref >60)
Glucose, Bld: 42 mg/dL — CL (ref 70–99)
Potassium: 5.2 mmol/L — ABNORMAL HIGH (ref 3.5–5.1)
Sodium: 138 mmol/L (ref 135–145)
Total Bilirubin: 1 mg/dL (ref 0.3–1.2)
Total Protein: 5.1 g/dL — ABNORMAL LOW (ref 6.5–8.1)

## 2021-06-18 LAB — GLUCOSE, CAPILLARY
Glucose-Capillary: 179 mg/dL — ABNORMAL HIGH (ref 70–99)
Glucose-Capillary: 51 mg/dL — ABNORMAL LOW (ref 70–99)
Glucose-Capillary: 55 mg/dL — ABNORMAL LOW (ref 70–99)
Glucose-Capillary: 151 mg/dL — ABNORMAL HIGH (ref 70–99)
Glucose-Capillary: 145 mg/dL — ABNORMAL HIGH (ref 70–99)
Glucose-Capillary: 121 mg/dL — ABNORMAL HIGH (ref 70–99)
Glucose-Capillary: 140 mg/dL — ABNORMAL HIGH (ref 70–99)

## 2021-06-18 MED ORDER — INSULIN GLARGINE-YFGN 100 UNIT/ML ~~LOC~~ SOLN
3.0000 [IU] | Freq: Every day | SUBCUTANEOUS | Status: DC
  Administered 2021-06-18: 3 [IU] via SUBCUTANEOUS
  Filled 2021-06-18 (×2): qty 0.03

## 2021-06-18 MED ORDER — INSULIN GLARGINE-YFGN 100 UNIT/ML ~~LOC~~ SOLN
4.0000 [IU] | Freq: Every day | SUBCUTANEOUS | Status: DC
  Administered 2021-06-18 – 2021-06-19 (×2): 4 [IU] via SUBCUTANEOUS
  Filled 2021-06-18 (×2): qty 0.04

## 2021-06-18 MED ORDER — PANTOPRAZOLE SODIUM 40 MG PO TBEC
40.0000 mg | DELAYED_RELEASE_TABLET | Freq: Every day | ORAL | Status: DC
  Administered 2021-06-18 – 2021-06-19 (×2): 40 mg via ORAL
  Filled 2021-06-18 (×2): qty 1

## 2021-06-18 NOTE — Progress Notes (Addendum)
Progress Note    Ashley Camacho  [id number] DOB: 1991-05-16  DOA: 06/14/2021 PCP: Pcp, No      Brief Narrative:    Medical records reviewed and are as summarized below:  Ashley Camacho is a 31 y.o. 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  She presented to the hospital again on 06/14/2021 because of nausea and vomiting.  She was found to have DKA, AKI and elevated liver enzymes.      Assessment/Plan:   Principal Problem:   DKA (diabetic ketoacidosis) ([place]) Active Problems:   Acute kidney injury ([place])   Depressive disorder   Diabetic gastroparesis ([place])   Adrenal insufficiency ([place])   CKD stage 3 due to type 1 diabetes mellitus (HCC)   Transaminitis   Body mass index is 25.33 kg/m.   DKA, type I DM with hyperglycemia, hypoglycemia: DKA has resolved.  Glucose was down to 42 this morning.  Continue low-dose glargine (decreased dose) and NovoLog as needed.  Monitor glucose levels closely.   AKI on CKD stage IIIa, mild hyperkalemia: Creatinine is trending down.  Continue low rate IV fluids for now but fluids will be discontinued later today to avoid fluid overload.  Diabetic gastroparesis, GERD: Continue IV erythromycin and antiemetics as needed for nausea/vomiting.  Continue Pepcid.  Add Protonix.  Elevated liver enzymes/acute hepatitis: Improving GI has signed off.  Adrenal insufficiency: Continue hydrocortisone  Thrombocytopenia: Monitor CBC  Valvular heart disease (moderate mitral regurgitation, moderate pulmonary regurgitation):  Compensated  Depression, anxiety, hallucinations: Continue Remeron and Xanax as needed.  Follow-up with psychiatrist.  Homelessness: Consulted social worker to assist with medications and disposition.    Diet Order             Diet Carb Modified Fluid consistency: Thin; Room service appropriate? Yes  Diet effective now                      Consultants: Gastroenterologist Psychiatrist  Procedures: None    Medications:    Chlorhexidine Gluconate Cloth  6 each Topical Q0600   erythromycin  250 mg Oral Q6H   famotidine  20 mg Oral Daily   feeding supplement (GLUCERNA SHAKE)  237 mL Oral TID BM   hydrocortisone  10 mg Oral q1600   hydrocortisone  20 mg Oral Q breakfast   insulin aspart  0-6 Units Subcutaneous TID WC   insulin glargine-yfgn  3 Units Subcutaneous QHS   insulin glargine-yfgn  4 Units Subcutaneous Daily   levothyroxine  25 mcg Oral Q0600   mirtazapine  30 mg Oral QHS   multivitamin with minerals  1 tablet Oral Daily   pantoprazole  40 mg Oral Daily   Continuous Infusions:  lactated ringers 50 mL/hr at 06/18/21 0911   promethazine (PHENERGAN) injection (IM or IVPB) 12.5 mg (06/15/21 2121)     Anti-infectives (From admission, onward)    Start     Dose/Rate Route Frequency Ordered Stop   06/15/21 1200  erythromycin (ERY-TAB) EC tablet 250 mg        250 mg Oral Every 6 hours 06/15/21 0825                Family Communication/Anticipated D/C date and plan/Code Status  DVT prophylaxis: SCDs Start: 06/14/21 1512     Code Status: Full Code  Family Communication: None Disposition Plan: Plan to discharge home in 1 to 2 days   Status is: Inpatient  Remains inpatient appropriate because: DKA, monitor glucose and liver enzymes          Subjective:   Interval events noted.  She had hypoglycemic episodes this morning.  She said she is not eating well because of acid reflux.  Objective:    Vitals:   06/18/21 0502 06/18/21  0556 06/18/21 0818 06/18/21 1204  BP: 108/73  102/82 (!) 118/94  Pulse: 77  79 80  Resp: 18  16 16   Temp:  (!) 97.5 F (36.4 C) 97.9 F (36.6 C) (!) 97.3 F (36.3 C)  TempSrc:  Oral Oral   SpO2: 99%  99% 100%  Weight:      Height:       No data found.   Intake/Output Summary (Last 24 hours) at 06/18/2021 1307 Last data filed at 06/18/2021 0911 Gross per 24 hour  Intake 1545.94 ml  Output --  Net 1545.94 ml   Filed Weights   06/15/21 0500 06/16/21 0500 06/17/21 0438  Weight: 63.9 kg 62.8 kg 60.8 kg    Exam:  GEN: NAD SKIN: No rash EYES: EOMI ENT: MMM CV: RRR PULM: CTA B ABD: soft, ND, NT, +BS CNS: AAO x 3, non focal EXT: No edema or tenderness        Data Reviewed:   I have personally reviewed following labs and imaging studies:  Labs: Labs show the following:   Basic Metabolic Panel: Recent Labs  Lab 06/14/21 1004 06/14/21 2337 06/15/21 0342 06/15/21 0728 06/16/21 0504 06/17/21 0426 06/18/21 0408  NA  --  142 141  --  140 140 138  K  --  3.9 3.6  --  4.4 5.0 5.2*  CL  --  108 109  --  106 105 106  CO2  --  25 26  --  27 26 25   GLUCOSE  --  148* 152*  --  192* 223* 42*  BUN  --  30* 30*  --  35* 42* 42*  CREATININE  --  1.98* 1.97*  --  2.31* 2.51* 2.07*  CALCIUM  --  8.2* 8.1*  --  8.0* 8.0* 8.0*  MG 2.3  --   --  1.9 2.1 2.2  --   PHOS  --   --   --  2.7 3.8 3.9  --    GFR Estimated Creatinine Clearance: 33.2 mL/min (A) (by C-G formula based on SCr of 2.07 mg/dL (H)). Liver Function Tests: Recent Labs  Lab 06/14/21 2337 06/15/21 0342 06/16/21 0504 06/17/21 0426 06/18/21 0408  AST 675* 561* 353* 324* 184*  ALT 440* 426* 390* 402* 344*  ALKPHOS 159* 159* 196* 233* 271*  BILITOT 1.0 0.9 0.7 0.6 1.0  PROT 5.2* 5.3* 5.2* 5.0* 5.1*  ALBUMIN 2.9* 2.8* 3.0* 2.9* 2.9*   Recent Labs  Lab 06/14/21 1003  LIPASE 25   No results for input(s): AMMONIA in the last 168 hours. Coagulation profile Recent Labs  Lab 06/14/21 1202  06/15/21 0342  INR 2.2* 1.9*    CBC: Recent Labs  Lab 06/14/21 1003 06/15/21 0342 06/16/21 0504 06/17/21 0426 06/18/21 0408  WBC 8.0 4.0 8.6 7.7 7.5  NEUTROABS  --   --  8.0* 6.9 5.9  HGB 11.6* 10.5* 10.4* 10.6* 11.1*  HCT 38.6 33.2* 33.1* 34.6* 36.2  MCV  100.0 96.5 99.4 100.6* 99.7  PLT 114* 94* 95* 99* 96*   Cardiac Enzymes: Recent Labs  Lab 06/14/21 1202  CKTOTAL 218   BNP (last 3 results) No results for input(s): PROBNP in the last 8760 hours. CBG: Recent Labs  Lab 06/18/21 0531 06/18/21 0553 06/18/21 0621 06/18/21 0819 06/18/21 1206  GLUCAP 51* 55* 179* 151* 145*   D-Dimer: No results for input(s): DDIMER in the last 72 hours. Hgb A1c: No results for input(s): HGBA1C in the last 72 hours. Lipid Profile: No results for input(s): CHOL, HDL, LDLCALC, TRIG, CHOLHDL, LDLDIRECT in the last 72 hours. Thyroid function studies: No results for input(s): TSH, T4TOTAL, T3FREE, THYROIDAB in the last 72 hours.  Invalid input(s): FREET3 Anemia work up: No results for input(s): VITAMINB12, FOLATE, FERRITIN, TIBC, IRON, RETICCTPCT in the last 72 hours.  Sepsis Labs: Recent Labs  Lab 06/14/21 1202 06/15/21 0342 06/16/21 0504 06/17/21 0426 06/18/21 0408  WBC  --  4.0 8.6 7.7 7.5  LATICACIDVEN 2.4*  --   --   --   --     Microbiology Recent Results (from the past 240 hour(s))  Resp Panel by RT-PCR (Flu A&B, Covid) Nasopharyngeal Swab     Status: None   Collection Time: 06/14/21 10:27 AM   Specimen: Nasopharyngeal Swab; Nasopharyngeal(NP) swabs in vial transport medium  Result Value Ref Range Status   SARS Coronavirus 2 by RT PCR NEGATIVE NEGATIVE Final    Comment: (NOTE) SARS-CoV-2 target nucleic acids are NOT DETECTED.  The SARS-CoV-2 RNA is generally detectable in upper respiratory specimens during the acute phase of infection. The lowest concentration of SARS-CoV-2 viral copies this assay can detect is 138 copies/mL. A negative result does not preclude  SARS-Cov-2 infection and should not be used as the sole basis for treatment or other patient management decisions. A negative result may occur with  improper specimen collection/handling, submission of specimen other than nasopharyngeal swab, presence of viral mutation(s) within the areas targeted by this assay, and inadequate number of viral copies(<138 copies/mL). A negative result must be combined with clinical observations, patient history, and epidemiological information. The expected result is Negative.  Fact Sheet for Patients:  EntrepreneurPulse.com.au  Fact Sheet for Healthcare Providers:  IncredibleEmployment.be  This test is no t yet approved or cleared by the [place] FDA and  has been authorized for detection and/or diagnosis of SARS-CoV-2 by FDA under an Emergency Use Authorization (EUA). This EUA will remain  in effect (meaning this test can be used) for the duration of the COVID-19 declaration under Section 564(b)(1) of the Act, 21 U.S.C.section 360bbb-3(b)(1), unless the authorization is terminated  or revoked sooner.       Influenza A by PCR NEGATIVE NEGATIVE Final   Influenza B by PCR NEGATIVE NEGATIVE Final    Comment: (NOTE) The Xpert Xpress SARS-CoV-2/FLU/RSV plus assay is intended as an aid in the diagnosis of influenza from Nasopharyngeal swab specimens and should not be used as a sole basis for treatment. Nasal washings and aspirates are unacceptable for Xpert Xpress SARS-CoV-2/FLU/RSV testing.  Fact Sheet for Patients: EntrepreneurPulse.com.au  Fact Sheet for Healthcare Providers: IncredibleEmployment.be  This test is not yet approved or cleared by the [place] FDA and has been authorized for detection and/or diagnosis of SARS-CoV-2 by FDA under an Emergency Use Authorization (EUA). This EUA will remain in effect (meaning this test can be used) for the duration of  the COVID-19 declaration under Section 564(b)(1) of the Act, 21 U.S.C. section 360bbb-3(b)(1), unless  the authorization is terminated or revoked.  Performed at [facility], [address] [place], [place] [address]   MRSA Next Gen by PCR, Nasal     Status: None   Collection Time: 06/14/21  6:08 PM   Specimen: Nasal Mucosa; Nasal Swab  Result Value Ref Range Status   MRSA by PCR Next Gen NOT DETECTED NOT DETECTED Final    Comment: (NOTE) The GeneXpert MRSA Assay (FDA approved for NASAL specimens only), is one component of a comprehensive MRSA colonization surveillance program. It is not intended to diagnose MRSA infection nor to guide or monitor treatment for MRSA infections. Test performance is not FDA approved in patients less than 63 years old. Performed at [facility], [address] [place], [place] [address]     Procedures and diagnostic studies:  No results found.             LOS: 4 days   Vietta Bonifield  [facility]   Pager on [email or web address]. If 7PM-7AM, please contact night-coverage at [email or web address]     06/18/2021, 1:07 PM

## 2021-06-19 ENCOUNTER — Other Ambulatory Visit: Payer: Self-pay

## 2021-06-19 DIAGNOSIS — N179 Acute kidney failure, unspecified: Secondary | ICD-10-CM | POA: Diagnosis not present

## 2021-06-19 DIAGNOSIS — E1010 Type 1 diabetes mellitus with ketoacidosis without coma: Secondary | ICD-10-CM | POA: Diagnosis not present

## 2021-06-19 LAB — BASIC METABOLIC PANEL
Anion gap: 7 (ref 5–15)
BUN: 41 mg/dL — ABNORMAL HIGH (ref 6–20)
CO2: 29 mmol/L (ref 22–32)
Calcium: 7.9 mg/dL — ABNORMAL LOW (ref 8.9–10.3)
Chloride: 104 mmol/L (ref 98–111)
Creatinine, Ser: 1.76 mg/dL — ABNORMAL HIGH (ref 0.44–1.00)
GFR, Estimated: 39 mL/min — ABNORMAL LOW (ref >60)
Glucose, Bld: 128 mg/dL — ABNORMAL HIGH (ref 70–99)
Potassium: 5 mmol/L (ref 3.5–5.1)
Sodium: 140 mmol/L (ref 135–145)

## 2021-06-19 LAB — CBC WITH DIFFERENTIAL/PLATELET
Abs Immature Granulocytes: 0.03 10*3/uL (ref 0.00–0.07)
Basophils Absolute: 0 10*3/uL (ref 0.0–0.1)
Basophils Relative: 0 %
Eosinophils Absolute: 0 10*3/uL (ref 0.0–0.5)
Eosinophils Relative: 0 %
HCT: 38 % (ref 36.0–46.0)
Hemoglobin: 11.6 g/dL — ABNORMAL LOW (ref 12.0–15.0)
Immature Granulocytes: 0 %
Lymphocytes Relative: 12 %
Lymphs Abs: 0.9 10*3/uL (ref 0.7–4.0)
MCH: 30.3 pg (ref 26.0–34.0)
MCHC: 30.5 g/dL (ref 30.0–36.0)
MCV: 99.2 fL (ref 80.0–100.0)
Monocytes Absolute: 0.4 10*3/uL (ref 0.1–1.0)
Monocytes Relative: 5 %
Neutro Abs: 6.3 10*3/uL (ref 1.7–7.7)
Neutrophils Relative %: 83 %
Platelets: 108 10*3/uL — ABNORMAL LOW (ref 150–400)
RBC: 3.83 MIL/uL — ABNORMAL LOW (ref 3.87–5.11)
RDW: 14.9 % (ref 11.5–15.5)
WBC: 7.6 10*3/uL (ref 4.0–10.5)
nRBC: 0.5 % — ABNORMAL HIGH (ref 0.0–0.2)

## 2021-06-19 LAB — MAGNESIUM: Magnesium: 2.3 mg/dL (ref 1.7–2.4)

## 2021-06-19 LAB — GLUCOSE, CAPILLARY
Glucose-Capillary: 134 mg/dL — ABNORMAL HIGH (ref 70–99)
Glucose-Capillary: 186 mg/dL — ABNORMAL HIGH (ref 70–99)

## 2021-06-19 LAB — PHOSPHORUS: Phosphorus: 2.9 mg/dL (ref 2.5–4.6)

## 2021-06-19 MED ORDER — "INSULIN SYRINGE-NEEDLE U-100 31G X 5/16"" 0.3 ML MISC"
0 refills | Status: DC
  Filled 2021-06-19: qty 60, 12d supply, fill #0

## 2021-06-19 MED ORDER — INSULIN ASPART 100 UNIT/ML IJ SOLN
2.0000 [IU] | Freq: Three times a day (TID) | INTRAMUSCULAR | 0 refills | Status: DC
  Filled 2021-06-19: qty 10, 90d supply, fill #0

## 2021-06-19 MED ORDER — PANTOPRAZOLE SODIUM 40 MG PO TBEC
40.0000 mg | DELAYED_RELEASE_TABLET | Freq: Every day | ORAL | 0 refills | Status: DC
  Filled 2021-06-19: qty 30, 30d supply, fill #0

## 2021-06-19 MED ORDER — ERYTHROMYCIN BASE 250 MG PO TBEC
250.0000 mg | DELAYED_RELEASE_TABLET | Freq: Three times a day (TID) | ORAL | 0 refills | Status: AC
  Filled 2021-06-19: qty 12, 4d supply, fill #0

## 2021-06-19 MED ORDER — HYDROCORTISONE 10 MG PO TABS
10.0000 mg | ORAL_TABLET | ORAL | 0 refills | Status: DC
  Filled 2021-06-19: qty 30, 30d supply, fill #0

## 2021-06-19 MED ORDER — HYDROCORTISONE 5 MG PO TABS
5.0000 mg | ORAL_TABLET | Freq: Every day | ORAL | 0 refills | Status: DC
  Filled 2021-06-19: qty 30, 30d supply, fill #0

## 2021-06-19 MED ORDER — MIRTAZAPINE 30 MG PO TABS
30.0000 mg | ORAL_TABLET | Freq: Every day | ORAL | 0 refills | Status: DC
  Filled 2021-06-19: qty 30, 30d supply, fill #0

## 2021-06-19 MED ORDER — INSULIN GLARGINE 100 UNIT/ML ~~LOC~~ SOLN
4.0000 [IU] | Freq: Two times a day (BID) | SUBCUTANEOUS | 0 refills | Status: DC
  Filled 2021-06-19: qty 10, 90d supply, fill #0

## 2021-06-19 MED ORDER — LEVOTHYROXINE SODIUM 25 MCG PO TABS
25.0000 ug | ORAL_TABLET | Freq: Every day | ORAL | 0 refills | Status: DC
  Filled 2021-06-19: qty 30, 30d supply, fill #0

## 2021-06-19 NOTE — Discharge Summary (Signed)
Physician Discharge Summary  Ashley Camacho [id number] DOB: July 08, 1990 DOA: 06/14/2021  PCP: Merryl Hacker, No  Admit date: 06/14/2021 Discharge date: 06/19/2021  Discharge disposition: Home   Recommendations for Outpatient Follow-Up:   Follow-up with PCP in 1 week Follow-up with Dr.  Barry Brunner, endocrinologist, as soon as possible   Discharge Diagnosis:   Principal Problem:   DKA (diabetic ketoacidosis) ([place]) Active Problems:   Acute kidney injury ([place])   Depressive disorder   Diabetic gastroparesis ([place])   Adrenal insufficiency ([place])   CKD stage 3 due to type 1 diabetes mellitus ([place])   Transaminitis    Discharge Condition: Stable.  Diet recommendation:  Diet Order             Diet - low sodium heart healthy           Diet Carb Modified           Diet Carb Modified Fluid consistency: Thin; Room service appropriate? Yes  Diet effective now                     Code Status: Full Code     Hospital Course:   Ashley Camacho is a 31 y.o. 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  She presented to the hospital again on 06/14/2021 because of nausea and vomiting.  She was found to have DKA, AKI and elevated liver enzymes.  She was treated with IV fluids and IV insulin per DKA protocol.  He was given antiemetics and IV azithromycin for nausea and vomiting secondary to diabetic gastroparesis.  She was seen by the psychiatrist for depression, anxiety and hallucinations.  Elevated liver enzymes were attributed to ischemic liver.  Her condition slowly improved.  She  reported that she was going to be homeless after leaving the hospital.  Social worker was consulted to assist with disposition and medications.  She plans to live with a friend at discharge.       Medical Consultants:   Gastroenterologist Psychiatrist   Discharge Exam:    Vitals:   06/18/21 2019 06/19/21 0405 06/19/21 0804 06/19/21 1206  BP: 113/87 114/90 (!) 120/97 114/88  Pulse: 78 81 85 85  Resp: 18 20 16 16   Temp: 98.4 F (36.9 C) 98 F (36.7 C) 97.8 F (36.6 C) 97.6 F (36.4 C)  TempSrc:   Oral   SpO2: 98% 99% 99% 99%  Weight:      Height:         GEN: NAD SKIN: Warm and dry EYES: No pallor or icterus ENT: MMM CV: RRR PULM: CTA B ABD: soft, ND, NT, +BS CNS: AAO x 3, non focal EXT: No edema or tenderness   The results of significant diagnostics from this hospitalization (including imaging, microbiology, ancillary and laboratory) are listed below for reference.     Procedures and Diagnostic Studies:   CT ABDOMEN PELVIS WO CONTRAST  Result Date: 06/14/2021 CLINICAL DATA:  Diabetic presenting from home with vomiting for 2-3 days. EXAM: CT ABDOMEN AND PELVIS WITHOUT CONTRAST TECHNIQUE: Multidetector CT imaging of the abdomen and pelvis was performed following the standard protocol without IV contrast. RADIATION DOSE REDUCTION: This exam was performed according to the departmental dose-optimization program which includes automated exposure control,  adjustment of the mA and/or kV according to patient size and/or use of iterative reconstruction technique. COMPARISON:  02/23/2021 FINDINGS: Lower chest: Moderate to large right pleural effusion. Small to moderate left pleural effusion noted. Hepatobiliary: No focal liver abnormality. There is mild diffuse gallbladder wall edema. Stones are noted layering within the dependent portion of the gallbladder. No signs of bile duct dilatation. Pancreas: Unremarkable. No pancreatic ductal dilatation or surrounding inflammatory  changes. Spleen: Normal in size without focal abnormality. Adrenals/Urinary Tract: Normal appearance of the adrenal glands. The left kidney is unremarkable. Right-sided pelvocaliectasis is identified. No urinary tract calculi identified bilaterally. Bladder appears normal. Stomach/Bowel: Stomach appears normal. The appendix is visualized and is within normal limits. No bowel wall thickening, inflammation or distension identified. Vascular/Lymphatic: No significant vascular findings are present. No enlarged abdominal or pelvic lymph nodes. Reproductive: Uterus and bilateral adnexa are unremarkable. Other: Interval increase in abdominopelvic ascites. Musculoskeletal: Previously noted ventral abdominal wall fluid collection/hematoma has decreased in size compared with 02/23/2021. On today's study this measures 5.2 x 2.3 by 5.9 cm. Previously this measured 11.8 by 11.0 by 14.4 cm. There is marked diffuse body wall edema compatible with anasarca. 2 no acute or suspicious osseous findings. IMPRESSION: 1. Marked diffuse body wall edema compatible with anasarca. 2. Interval increase in abdominopelvic ascites. 3. Decrease in size of ventral abdominal wall fluid collection/hematoma. 4. Bilateral pleural effusions, right greater than left. 5. Gallstones with mild gallbladder wall edema. Findings are nonspecific in the setting of ascites. If there is a clinical concern for acute cholecystitis consider further evaluation with right upper quadrant sonogram. 6. Right-sided pelvocaliectasis. No urinary tract calculi identified. Electronically Signed   By: Kerby Moors M.D.   On: 06/14/2021 14:37   US ABDOMEN LIMITED RUQ (LIVER/GB)  Result Date: 06/14/2021 CLINICAL DATA:  Right upper quadrant pain 3 days.  Transaminitis. EXAM: ULTRASOUND ABDOMEN LIMITED RIGHT UPPER QUADRANT COMPARISON:  CT abdomen pelvis 02/23/2021 FINDINGS: Gallbladder: Multiple gallstones up to 8 mm in diameter. Negative sonographic Murphy sign. Fluid around  the gallbladder may be pericholecystic fluid versus ascites. Common bile duct: Diameter: 3 mm Liver: No focal lesion identified. Within normal limits in parenchymal echogenicity. Portal vein is patent on color Doppler imaging with normal direction of blood flow towards the liver. Other: Small amount of ascites Right pleural effusion IMPRESSION: Cholelithiasis without   biliary dilatation Ascites Right pleural effusion Electronically Signed   By: Franchot Gallo M.D.   On: 06/14/2021 13:25     Labs:   Basic Metabolic Panel: Recent Labs  Lab 06/14/21 1004 06/14/21 2337 06/15/21 0342 06/15/21 0728 06/16/21 0504 06/17/21 0426 06/18/21 0408 06/19/21 0604  NA  --    < > 141  --  140 140 138 140  K  --    < > 3.6  --  4.4 5.0 5.2* 5.0  CL  --    < > 109  --  106 105 106 104  CO2  --    < > 26  --  27 26 25 29   GLUCOSE  --    < > 152*  --  192* 223* 42* 128*  BUN  --    < > 30*  --  35* 42* 42* 41*  CREATININE  --    < > 1.97*  --  2.31* 2.51* 2.07* 1.76*  CALCIUM  --    < > 8.1*  --  8.0* 8.0* 8.0* 7.9*  MG 2.3  --   --  1.9 2.1  2.2  --  2.3  PHOS  --   --   --  2.7 3.8 3.9  --  2.9   < > = values in this interval not displayed.   GFR Estimated Creatinine Clearance: 39.1 mL/min (A) (by C-G formula based on SCr of 1.76 mg/dL (H)). Liver Function Tests: Recent Labs  Lab 06/14/21 2337 06/15/21 0342 06/16/21 0504 06/17/21 0426 06/18/21 0408  AST 675* 561* 353* 324* 184*  ALT 440* 426* 390* 402* 344*  ALKPHOS 159* 159* 196* 233* 271*  BILITOT 1.0 0.9 0.7 0.6 1.0  PROT 5.2* 5.3* 5.2* 5.0* 5.1*  ALBUMIN 2.9* 2.8* 3.0* 2.9* 2.9*   Recent Labs  Lab 06/14/21 1003  LIPASE 25   No results for input(s): AMMONIA in the last 168 hours. Coagulation profile Recent Labs  Lab 06/14/21 1202 06/15/21 0342  INR 2.2* 1.9*    CBC: Recent Labs  Lab 06/15/21 0342 06/16/21 0504 06/17/21 0426 06/18/21 0408 06/19/21 0604  WBC 4.0 8.6 7.7 7.5 7.6  NEUTROABS  --  8.0* 6.9 5.9 6.3  HGB  10.5* 10.4* 10.6* 11.1* 11.6*  HCT 33.2* 33.1* 34.6* 36.2 38.0  MCV 96.5 99.4 100.6* 99.7 99.2  PLT 94* 95* 99* 96* 108*   Cardiac Enzymes: Recent Labs  Lab 06/14/21 1202  CKTOTAL 218   BNP: Invalid input(s): POCBNP CBG: Recent Labs  Lab 06/18/21 1206 06/18/21 1601 06/18/21 2200 06/19/21 0753 06/19/21 1149  GLUCAP 145* 121* 140* 134* 186*   D-Dimer No results for input(s): DDIMER in the last 72 hours. Hgb A1c No results for input(s): HGBA1C in the last 72 hours. Lipid Profile No results for input(s): CHOL, HDL, LDLCALC, TRIG, CHOLHDL, LDLDIRECT in the last 72 hours. Thyroid function studies No results for input(s): TSH, T4TOTAL, T3FREE, THYROIDAB in the last 72 hours.  Invalid input(s): FREET3 Anemia work up No results for input(s): VITAMINB12, FOLATE, FERRITIN, TIBC, IRON, RETICCTPCT in the last 72 hours. Microbiology Recent Results (from the past 240 hour(s))  Resp Panel by RT-PCR (Flu A&B, Covid) Nasopharyngeal Swab     Status: None   Collection Time: 06/14/21 10:27 AM   Specimen: Nasopharyngeal Swab; Nasopharyngeal(NP) swabs in vial transport medium  Result Value Ref Range Status   SARS Coronavirus 2 by RT PCR NEGATIVE NEGATIVE Final    Comment: (NOTE) SARS-CoV-2 target nucleic acids are NOT DETECTED.  The SARS-CoV-2 RNA is generally detectable in upper respiratory specimens during the acute phase of infection. The lowest concentration of SARS-CoV-2 viral copies this assay can detect is 138 copies/mL. A negative result does not preclude SARS-Cov-2 infection and should not be used as the sole basis for treatment or other patient management decisions. A negative result may occur with  improper specimen collection/handling, submission of specimen other than nasopharyngeal swab, presence of viral mutation(s) within the areas targeted by this assay, and inadequate number of viral copies(<138 copies/mL). A negative result must be combined with clinical  observations, patient history, and epidemiological information. The expected result is Negative.  Fact Sheet for Patients:  EntrepreneurPulse.com.au  Fact Sheet for Healthcare Providers:  IncredibleEmployment.be  This test is no t yet approved or cleared by the [place] FDA and  has been authorized for detection and/or diagnosis of SARS-CoV-2 by FDA under an Emergency Use Authorization (EUA). This EUA will remain  in effect (meaning this test can be used) for the duration of the COVID-19 declaration under Section 564(b)(1) of the Act, 21 U.S.C.section 360bbb-3(b)(1), unless the authorization is terminated  or revoked  sooner.       Influenza A by PCR NEGATIVE NEGATIVE Final   Influenza B by PCR NEGATIVE NEGATIVE Final    Comment: (NOTE) The Xpert Xpress SARS-CoV-2/FLU/RSV plus assay is intended as an aid in the diagnosis of influenza from Nasopharyngeal swab specimens and should not be used as a sole basis for treatment. Nasal washings and aspirates are unacceptable for Xpert Xpress SARS-CoV-2/FLU/RSV testing.  Fact Sheet for Patients: BloggerCourse.com  Fact Sheet for Healthcare Providers: SeriousBroker.it  This test is not yet approved or cleared by the [place] FDA and has been authorized for detection and/or diagnosis of SARS-CoV-2 by FDA under an Emergency Use Authorization (EUA). This EUA will remain in effect (meaning this test can be used) for the duration of the COVID-19 declaration under Section 564(b)(1) of the Act, 21 U.S.C. section 360bbb-3(b)(1), unless the authorization is terminated or revoked.  Performed at [facility], [address] [place], [place] [address]   MRSA Next Gen by PCR, Nasal     Status: None   Collection Time: 06/14/21  6:08 PM   Specimen: Nasal Mucosa; Nasal Swab  Result Value Ref Range Status   MRSA by PCR Next Gen NOT DETECTED  NOT DETECTED Final    Comment: (NOTE) The GeneXpert MRSA Assay (FDA approved for NASAL specimens only), is one component of a comprehensive MRSA colonization surveillance program. It is not intended to diagnose MRSA infection nor to guide or monitor treatment for MRSA infections. Test performance is not FDA approved in patients less than 80 years old. Performed at [facility], [address] [place], [place] [address]      Discharge Instructions:   Discharge Instructions     Diet - low sodium heart healthy   Complete by: As directed    Diet Carb Modified   Complete by: As directed    Discharge instructions   Complete by: As directed    Follow up with Dr. Amalia Hailey, endocrinologist, as soon as possible   Increase activity slowly   Complete by: As directed       Allergies as of 06/19/2021   No Known Allergies      Medication List     STOP taking these medications    metoCLOPramide 10 MG tablet Commonly known as: REGLAN   NovoLOG FlexPen 100 UNIT/ML FlexPen Generic drug: insulin aspart Replaced by: insulin aspart 100 UNIT/ML injection       TAKE these medications    Acetaminophen Extra Strength 500 MG tablet Generic drug: acetaminophen Take 2 tablets (1,000 mg total) by mouth every 6 (six) hours as needed for headache, fever or moderate pain.   erythromycin 250 MG EC tablet Commonly known as: ERY-TAB Take 1 tablet (250 mg total) by mouth 3 (three) times daily for 5 days.   hydrocortisone 10 MG tablet Commonly known as: CORTEF Take 1 tablet (10 mg total) by mouth once every morning.   hydrocortisone 5 MG tablet Commonly known as: CORTEF Take 1 tablet (5 mg total) by mouth once daily at 4PM.   insulin aspart 100 UNIT/ML injection Commonly known as: NovoLOG Inject 2 Units into the skin 3 (three) times daily with meals. Replaces: NovoLOG FlexPen 100 UNIT/ML FlexPen   insulin glargine 100 UNIT/ML injection Commonly known as: LANTUS Inject 0.04  mLs (4 Units total) into the skin 2 (two) times daily. Inject 4 units in the morning and 4 units in the evening   Insulin Syringe-Needle U-100 31G X 5/16" 0.3 ML Misc Commonly  known as: BD Insulin Syringe U/F Use as directed 5 times daily with Lantus and Novolog.   levothyroxine 25 MCG tablet Commonly known as: SYNTHROID Take 1 tablet (25 mcg total) by mouth once daily at 6 (six) AM.   mirtazapine 30 MG tablet Commonly known as: REMERON Take 1 tablet (30 mg total) by mouth at bedtime.   multivitamin with minerals Tabs tablet Take 1 tablet by mouth daily.   pantoprazole 40 MG tablet Commonly known as: PROTONIX Take 1 tablet (40 mg total) by mouth daily. Start taking on: June 20, 2021           If you experience worsening of your admission symptoms, develop shortness of breath, life threatening emergency, suicidal or homicidal thoughts you must seek medical attention immediately by calling 911 or calling your MD immediately  if symptoms less severe.   You must read complete instructions/literature along with all the possible adverse reactions/side effects for all the medicines you take and that have been prescribed to you. Take any new medicines after you have completely understood and accept all the possible adverse reactions/side effects.    Please note   You were cared for by a hospitalist during your hospital stay. If you have any questions about your discharge medications or the care you received while you were in the hospital after you are discharged, you can call the unit and asked to speak with the hospitalist on call if the hospitalist that took care of you is not available. Once you are discharged, your primary care physician will handle any further medical issues. Please note that NO REFILLS for any discharge medications will be authorized once you are discharged, as it is imperative that you return to your primary care physician (or establish a relationship with a  primary care physician if you do not have one) for your aftercare needs so that they can reassess your need for medications and monitor your lab values.       Time coordinating discharge: 34 minutes  Signed:  Lavette Yankovich  [facility] 06/19/2021, 1:00 PM   Pager on [email or web address]. If 7PM-7AM, please contact night-coverage at [email or web address]

## 2021-06-19 NOTE — TOC Progression Note (Signed)
Transition of Care [facility]) - Progression Note    Patient Details  Name: Ashley Camacho MRN: [id number] Date of Birth: 09/09/1990  Transition of Care [facility]) CM/SW Contact  Margarito Liner, LCSW Phone Number: 06/19/2021, 11:08 AM  Clinical Narrative:   Patient said she thinks she can stay with some friends in [place] near [place]. She may need a ride to her home to pick up her belongings and will figure out a ride to her friends' house from there. Pharmacist will find out cost of medications. May be able to use petty cash.  Expected Discharge Plan: Homeless Shelter (vs home with family) Barriers to Discharge: Continued Medical Work up  Expected Discharge Plan and Services Expected Discharge Plan: Homeless Shelter (vs home with family)     Post Acute Care Choice: NA Living arrangements for the past 2 months: Single Family Home Expected Discharge Date: 06/15/21                                     Social Determinants of Health (SDOH) Interventions    Readmission Risk Interventions Readmission Risk Prevention Plan 06/16/2021 06/03/2021 05/22/2021  Transportation Screening Complete Complete Complete  PCP or Specialist Appt within 3-5 Days - - Complete  HRI or Home Care Consult - - Complete  Social Work Consult for Recovery Care Planning/Counseling - - Complete  Palliative Care Screening - - Not Applicable  Medication Review [employer]) Complete Complete Complete  PCP or Specialist appointment within 3-5 days of discharge Complete Complete -  HRI or Home Care Consult - Not Complete -  HRI or Home Care Consult Pt Refusal Comments - NA -  SW Recovery Care/Counseling Consult Complete Complete -  Palliative Care Screening Not Applicable Not Applicable -  Skilled Nursing Facility Not Applicable Not Applicable -  Some recent data might be hidden

## 2021-06-22 ENCOUNTER — Inpatient Hospital Stay
Admission: AD | Admit: 2021-06-22 | Payer: Medicaid Other | Source: Other Acute Inpatient Hospital | Admitting: Family Medicine

## 2021-06-22 DIAGNOSIS — K8020 Calculus of gallbladder without cholecystitis without obstruction: Secondary | ICD-10-CM | POA: Diagnosis not present

## 2021-06-22 DIAGNOSIS — M7989 Other specified soft tissue disorders: Secondary | ICD-10-CM | POA: Diagnosis not present

## 2021-06-22 DIAGNOSIS — K760 Fatty (change of) liver, not elsewhere classified: Secondary | ICD-10-CM | POA: Diagnosis not present

## 2021-06-22 DIAGNOSIS — I509 Heart failure, unspecified: Secondary | ICD-10-CM | POA: Diagnosis not present

## 2021-06-22 DIAGNOSIS — E1042 Type 1 diabetes mellitus with diabetic polyneuropathy: Secondary | ICD-10-CM | POA: Diagnosis not present

## 2021-06-22 DIAGNOSIS — I517 Cardiomegaly: Secondary | ICD-10-CM | POA: Diagnosis not present

## 2021-06-22 DIAGNOSIS — R079 Chest pain, unspecified: Secondary | ICD-10-CM | POA: Diagnosis not present

## 2021-06-22 DIAGNOSIS — J90 Pleural effusion, not elsewhere classified: Secondary | ICD-10-CM | POA: Diagnosis not present

## 2021-06-22 DIAGNOSIS — R188 Other ascites: Secondary | ICD-10-CM | POA: Diagnosis not present

## 2021-06-22 DIAGNOSIS — Z20822 Contact with and (suspected) exposure to covid-19: Secondary | ICD-10-CM | POA: Diagnosis not present

## 2021-06-22 DIAGNOSIS — R0602 Shortness of breath: Secondary | ICD-10-CM | POA: Diagnosis not present

## 2021-06-22 DIAGNOSIS — E109 Type 1 diabetes mellitus without complications: Secondary | ICD-10-CM | POA: Diagnosis not present

## 2021-06-23 DIAGNOSIS — I509 Heart failure, unspecified: Secondary | ICD-10-CM | POA: Diagnosis not present

## 2021-06-24 DIAGNOSIS — J90 Pleural effusion, not elsewhere classified: Secondary | ICD-10-CM | POA: Diagnosis not present

## 2021-06-24 DIAGNOSIS — M7989 Other specified soft tissue disorders: Secondary | ICD-10-CM | POA: Diagnosis not present

## 2021-06-24 DIAGNOSIS — K760 Fatty (change of) liver, not elsewhere classified: Secondary | ICD-10-CM | POA: Diagnosis not present

## 2021-06-24 DIAGNOSIS — R188 Other ascites: Secondary | ICD-10-CM | POA: Diagnosis not present

## 2021-06-24 DIAGNOSIS — K8020 Calculus of gallbladder without cholecystitis without obstruction: Secondary | ICD-10-CM | POA: Diagnosis not present

## 2021-06-25 DIAGNOSIS — R079 Chest pain, unspecified: Secondary | ICD-10-CM | POA: Diagnosis not present

## 2021-06-25 DIAGNOSIS — K760 Fatty (change of) liver, not elsewhere classified: Secondary | ICD-10-CM | POA: Diagnosis not present

## 2021-06-25 DIAGNOSIS — K8020 Calculus of gallbladder without cholecystitis without obstruction: Secondary | ICD-10-CM | POA: Diagnosis not present

## 2021-06-25 DIAGNOSIS — T381X6A Underdosing of thyroid hormones and substitutes, initial encounter: Secondary | ICD-10-CM | POA: Diagnosis not present

## 2021-06-25 DIAGNOSIS — I5023 Acute on chronic systolic (congestive) heart failure: Secondary | ICD-10-CM | POA: Diagnosis not present

## 2021-06-25 DIAGNOSIS — I110 Hypertensive heart disease with heart failure: Secondary | ICD-10-CM | POA: Diagnosis not present

## 2021-06-25 DIAGNOSIS — E039 Hypothyroidism, unspecified: Secondary | ICD-10-CM | POA: Diagnosis not present

## 2021-06-25 DIAGNOSIS — E2740 Unspecified adrenocortical insufficiency: Secondary | ICD-10-CM | POA: Diagnosis not present

## 2021-06-25 DIAGNOSIS — Z794 Long term (current) use of insulin: Secondary | ICD-10-CM | POA: Diagnosis not present

## 2021-06-25 DIAGNOSIS — F32A Depression, unspecified: Secondary | ICD-10-CM | POA: Diagnosis not present

## 2021-06-25 DIAGNOSIS — R600 Localized edema: Secondary | ICD-10-CM | POA: Diagnosis not present

## 2021-06-25 DIAGNOSIS — E109 Type 1 diabetes mellitus without complications: Secondary | ICD-10-CM | POA: Diagnosis not present

## 2021-06-25 DIAGNOSIS — J90 Pleural effusion, not elsewhere classified: Secondary | ICD-10-CM | POA: Diagnosis not present

## 2021-06-25 DIAGNOSIS — M7989 Other specified soft tissue disorders: Secondary | ICD-10-CM | POA: Diagnosis not present

## 2021-06-25 DIAGNOSIS — I509 Heart failure, unspecified: Secondary | ICD-10-CM | POA: Diagnosis not present

## 2021-06-25 DIAGNOSIS — Z79899 Other long term (current) drug therapy: Secondary | ICD-10-CM | POA: Diagnosis not present

## 2021-06-25 DIAGNOSIS — R0600 Dyspnea, unspecified: Secondary | ICD-10-CM | POA: Diagnosis not present

## 2021-06-25 DIAGNOSIS — Z86718 Personal history of other venous thrombosis and embolism: Secondary | ICD-10-CM | POA: Diagnosis not present

## 2021-06-25 DIAGNOSIS — Z91128 Patient's intentional underdosing of medication regimen for other reason: Secondary | ICD-10-CM | POA: Diagnosis not present

## 2021-06-25 DIAGNOSIS — E1042 Type 1 diabetes mellitus with diabetic polyneuropathy: Secondary | ICD-10-CM | POA: Diagnosis not present

## 2021-06-25 DIAGNOSIS — R188 Other ascites: Secondary | ICD-10-CM | POA: Diagnosis not present

## 2021-06-25 DIAGNOSIS — Z5900 Homelessness unspecified: Secondary | ICD-10-CM | POA: Diagnosis not present

## 2021-06-25 DIAGNOSIS — K761 Chronic passive congestion of liver: Secondary | ICD-10-CM | POA: Diagnosis not present

## 2021-06-25 DIAGNOSIS — Z20822 Contact with and (suspected) exposure to covid-19: Secondary | ICD-10-CM | POA: Diagnosis not present

## 2021-06-25 DIAGNOSIS — O903 Peripartum cardiomyopathy: Secondary | ICD-10-CM | POA: Diagnosis not present

## 2021-06-25 DIAGNOSIS — I959 Hypotension, unspecified: Secondary | ICD-10-CM | POA: Diagnosis not present

## 2021-06-25 DIAGNOSIS — N179 Acute kidney failure, unspecified: Secondary | ICD-10-CM | POA: Diagnosis not present

## 2021-06-25 DIAGNOSIS — Z9641 Presence of insulin pump (external) (internal): Secondary | ICD-10-CM | POA: Diagnosis not present

## 2021-06-25 DIAGNOSIS — F419 Anxiety disorder, unspecified: Secondary | ICD-10-CM | POA: Diagnosis not present

## 2021-06-25 LAB — MISC LABCORP TEST (SEND OUT): Labcorp test code: 791584

## 2021-06-26 DIAGNOSIS — E1142 Type 2 diabetes mellitus with diabetic polyneuropathy: Secondary | ICD-10-CM | POA: Diagnosis not present

## 2021-06-26 DIAGNOSIS — E2740 Unspecified adrenocortical insufficiency: Secondary | ICD-10-CM | POA: Diagnosis not present

## 2021-06-26 DIAGNOSIS — R079 Chest pain, unspecified: Secondary | ICD-10-CM | POA: Diagnosis not present

## 2021-06-26 DIAGNOSIS — Z8639 Personal history of other endocrine, nutritional and metabolic disease: Secondary | ICD-10-CM | POA: Diagnosis not present

## 2021-06-26 DIAGNOSIS — E1042 Type 1 diabetes mellitus with diabetic polyneuropathy: Secondary | ICD-10-CM | POA: Diagnosis not present

## 2021-06-26 DIAGNOSIS — R946 Abnormal results of thyroid function studies: Secondary | ICD-10-CM | POA: Diagnosis not present

## 2021-06-26 DIAGNOSIS — I5023 Acute on chronic systolic (congestive) heart failure: Secondary | ICD-10-CM | POA: Diagnosis not present

## 2021-06-27 DIAGNOSIS — E1142 Type 2 diabetes mellitus with diabetic polyneuropathy: Secondary | ICD-10-CM | POA: Diagnosis not present

## 2021-06-27 DIAGNOSIS — E2740 Unspecified adrenocortical insufficiency: Secondary | ICD-10-CM | POA: Diagnosis not present

## 2021-06-27 DIAGNOSIS — R079 Chest pain, unspecified: Secondary | ICD-10-CM | POA: Diagnosis not present

## 2021-06-27 DIAGNOSIS — I5023 Acute on chronic systolic (congestive) heart failure: Secondary | ICD-10-CM | POA: Diagnosis not present

## 2021-06-28 DIAGNOSIS — E109 Type 1 diabetes mellitus without complications: Secondary | ICD-10-CM | POA: Diagnosis not present

## 2021-06-28 DIAGNOSIS — I5023 Acute on chronic systolic (congestive) heart failure: Secondary | ICD-10-CM | POA: Diagnosis not present

## 2021-06-28 DIAGNOSIS — E2740 Unspecified adrenocortical insufficiency: Secondary | ICD-10-CM | POA: Diagnosis not present

## 2021-06-28 DIAGNOSIS — R079 Chest pain, unspecified: Secondary | ICD-10-CM | POA: Diagnosis not present

## 2021-06-28 DIAGNOSIS — E1142 Type 2 diabetes mellitus with diabetic polyneuropathy: Secondary | ICD-10-CM | POA: Diagnosis not present

## 2021-06-29 DIAGNOSIS — I5023 Acute on chronic systolic (congestive) heart failure: Secondary | ICD-10-CM | POA: Diagnosis not present

## 2021-06-29 DIAGNOSIS — E2740 Unspecified adrenocortical insufficiency: Secondary | ICD-10-CM | POA: Diagnosis not present

## 2021-06-29 DIAGNOSIS — E1142 Type 2 diabetes mellitus with diabetic polyneuropathy: Secondary | ICD-10-CM | POA: Diagnosis not present

## 2021-06-29 DIAGNOSIS — R079 Chest pain, unspecified: Secondary | ICD-10-CM | POA: Diagnosis not present

## 2021-06-30 DIAGNOSIS — E1042 Type 1 diabetes mellitus with diabetic polyneuropathy: Secondary | ICD-10-CM | POA: Diagnosis not present

## 2021-06-30 DIAGNOSIS — R946 Abnormal results of thyroid function studies: Secondary | ICD-10-CM | POA: Diagnosis not present

## 2021-06-30 DIAGNOSIS — I5023 Acute on chronic systolic (congestive) heart failure: Secondary | ICD-10-CM | POA: Diagnosis not present

## 2021-06-30 DIAGNOSIS — Z8639 Personal history of other endocrine, nutritional and metabolic disease: Secondary | ICD-10-CM | POA: Diagnosis not present

## 2021-06-30 DIAGNOSIS — E2740 Unspecified adrenocortical insufficiency: Secondary | ICD-10-CM | POA: Diagnosis not present

## 2021-06-30 DIAGNOSIS — E1142 Type 2 diabetes mellitus with diabetic polyneuropathy: Secondary | ICD-10-CM | POA: Diagnosis not present

## 2021-06-30 DIAGNOSIS — R079 Chest pain, unspecified: Secondary | ICD-10-CM | POA: Diagnosis not present

## 2021-07-07 DIAGNOSIS — I82629 Acute embolism and thrombosis of deep veins of unspecified upper extremity: Secondary | ICD-10-CM | POA: Diagnosis not present

## 2021-07-07 DIAGNOSIS — I5022 Chronic systolic (congestive) heart failure: Secondary | ICD-10-CM | POA: Diagnosis not present

## 2021-07-07 DIAGNOSIS — E2740 Unspecified adrenocortical insufficiency: Secondary | ICD-10-CM | POA: Diagnosis not present

## 2021-07-07 DIAGNOSIS — Z98891 History of uterine scar from previous surgery: Secondary | ICD-10-CM | POA: Diagnosis not present

## 2021-07-14 DIAGNOSIS — J918 Pleural effusion in other conditions classified elsewhere: Secondary | ICD-10-CM | POA: Diagnosis not present

## 2021-07-14 DIAGNOSIS — J189 Pneumonia, unspecified organism: Secondary | ICD-10-CM | POA: Diagnosis not present

## 2021-07-14 DIAGNOSIS — I5023 Acute on chronic systolic (congestive) heart failure: Secondary | ICD-10-CM | POA: Diagnosis not present

## 2021-07-14 DIAGNOSIS — I425 Other restrictive cardiomyopathy: Secondary | ICD-10-CM | POA: Diagnosis not present

## 2021-07-18 DIAGNOSIS — E162 Hypoglycemia, unspecified: Secondary | ICD-10-CM | POA: Diagnosis not present

## 2021-07-18 DIAGNOSIS — R079 Chest pain, unspecified: Secondary | ICD-10-CM | POA: Diagnosis not present

## 2021-07-18 DIAGNOSIS — E161 Other hypoglycemia: Secondary | ICD-10-CM | POA: Diagnosis not present

## 2021-07-18 DIAGNOSIS — R410 Disorientation, unspecified: Secondary | ICD-10-CM | POA: Diagnosis not present

## 2021-07-18 DIAGNOSIS — R0789 Other chest pain: Secondary | ICD-10-CM | POA: Diagnosis not present

## 2021-07-19 DIAGNOSIS — E10649 Type 1 diabetes mellitus with hypoglycemia without coma: Secondary | ICD-10-CM | POA: Diagnosis not present

## 2021-07-19 DIAGNOSIS — I5A Non-ischemic myocardial injury (non-traumatic): Secondary | ICD-10-CM | POA: Diagnosis not present

## 2021-07-19 DIAGNOSIS — R1011 Right upper quadrant pain: Secondary | ICD-10-CM | POA: Diagnosis not present

## 2021-07-19 DIAGNOSIS — R6520 Severe sepsis without septic shock: Secondary | ICD-10-CM | POA: Diagnosis not present

## 2021-07-19 DIAGNOSIS — Z794 Long term (current) use of insulin: Secondary | ICD-10-CM | POA: Diagnosis not present

## 2021-07-19 DIAGNOSIS — I2699 Other pulmonary embolism without acute cor pulmonale: Secondary | ICD-10-CM | POA: Diagnosis not present

## 2021-07-19 DIAGNOSIS — L03116 Cellulitis of left lower limb: Secondary | ICD-10-CM | POA: Diagnosis not present

## 2021-07-19 DIAGNOSIS — I428 Other cardiomyopathies: Secondary | ICD-10-CM | POA: Diagnosis not present

## 2021-07-19 DIAGNOSIS — E038 Other specified hypothyroidism: Secondary | ICD-10-CM | POA: Diagnosis not present

## 2021-07-19 DIAGNOSIS — Z9641 Presence of insulin pump (external) (internal): Secondary | ICD-10-CM | POA: Diagnosis not present

## 2021-07-19 DIAGNOSIS — R188 Other ascites: Secondary | ICD-10-CM | POA: Diagnosis not present

## 2021-07-19 DIAGNOSIS — R0789 Other chest pain: Secondary | ICD-10-CM | POA: Diagnosis not present

## 2021-07-19 DIAGNOSIS — Z7952 Long term (current) use of systemic steroids: Secondary | ICD-10-CM | POA: Diagnosis not present

## 2021-07-19 DIAGNOSIS — D8481 Immunodeficiency due to conditions classified elsewhere: Secondary | ICD-10-CM | POA: Diagnosis not present

## 2021-07-19 DIAGNOSIS — I5022 Chronic systolic (congestive) heart failure: Secondary | ICD-10-CM | POA: Diagnosis not present

## 2021-07-19 DIAGNOSIS — E1042 Type 1 diabetes mellitus with diabetic polyneuropathy: Secondary | ICD-10-CM | POA: Diagnosis not present

## 2021-07-19 DIAGNOSIS — K3184 Gastroparesis: Secondary | ICD-10-CM | POA: Diagnosis not present

## 2021-07-19 DIAGNOSIS — I5020 Unspecified systolic (congestive) heart failure: Secondary | ICD-10-CM | POA: Diagnosis not present

## 2021-07-19 DIAGNOSIS — I425 Other restrictive cardiomyopathy: Secondary | ICD-10-CM | POA: Diagnosis not present

## 2021-07-19 DIAGNOSIS — E1010 Type 1 diabetes mellitus with ketoacidosis without coma: Secondary | ICD-10-CM | POA: Diagnosis not present

## 2021-07-19 DIAGNOSIS — E873 Alkalosis: Secondary | ICD-10-CM | POA: Diagnosis not present

## 2021-07-19 DIAGNOSIS — E039 Hypothyroidism, unspecified: Secondary | ICD-10-CM | POA: Diagnosis not present

## 2021-07-19 DIAGNOSIS — A419 Sepsis, unspecified organism: Secondary | ICD-10-CM | POA: Diagnosis not present

## 2021-07-19 DIAGNOSIS — I3139 Other pericardial effusion (noninflammatory): Secondary | ICD-10-CM | POA: Diagnosis not present

## 2021-07-19 DIAGNOSIS — J90 Pleural effusion, not elsewhere classified: Secondary | ICD-10-CM | POA: Diagnosis not present

## 2021-07-19 DIAGNOSIS — R160 Hepatomegaly, not elsewhere classified: Secondary | ICD-10-CM | POA: Diagnosis not present

## 2021-07-19 DIAGNOSIS — T501X5A Adverse effect of loop [high-ceiling] diuretics, initial encounter: Secondary | ICD-10-CM | POA: Diagnosis not present

## 2021-07-19 DIAGNOSIS — N179 Acute kidney failure, unspecified: Secondary | ICD-10-CM | POA: Diagnosis not present

## 2021-07-19 DIAGNOSIS — J123 Human metapneumovirus pneumonia: Secondary | ICD-10-CM | POA: Diagnosis not present

## 2021-07-19 DIAGNOSIS — R071 Chest pain on breathing: Secondary | ICD-10-CM | POA: Diagnosis not present

## 2021-07-19 DIAGNOSIS — E2740 Unspecified adrenocortical insufficiency: Secondary | ICD-10-CM | POA: Diagnosis not present

## 2021-07-19 DIAGNOSIS — R8281 Pyuria: Secondary | ICD-10-CM | POA: Diagnosis not present

## 2021-07-19 DIAGNOSIS — E2749 Other adrenocortical insufficiency: Secondary | ICD-10-CM | POA: Diagnosis not present

## 2021-07-19 DIAGNOSIS — Z20822 Contact with and (suspected) exposure to covid-19: Secondary | ICD-10-CM | POA: Diagnosis not present

## 2021-07-19 DIAGNOSIS — T380X5A Adverse effect of glucocorticoids and synthetic analogues, initial encounter: Secondary | ICD-10-CM | POA: Diagnosis not present

## 2021-07-19 DIAGNOSIS — R509 Fever, unspecified: Secondary | ICD-10-CM | POA: Diagnosis not present

## 2021-07-20 DIAGNOSIS — R071 Chest pain on breathing: Secondary | ICD-10-CM | POA: Diagnosis not present

## 2021-07-21 DIAGNOSIS — R071 Chest pain on breathing: Secondary | ICD-10-CM | POA: Diagnosis not present

## 2021-07-22 DIAGNOSIS — R071 Chest pain on breathing: Secondary | ICD-10-CM | POA: Diagnosis not present

## 2021-07-22 DIAGNOSIS — N179 Acute kidney failure, unspecified: Secondary | ICD-10-CM | POA: Diagnosis not present

## 2021-07-22 DIAGNOSIS — I5020 Unspecified systolic (congestive) heart failure: Secondary | ICD-10-CM | POA: Diagnosis not present

## 2021-07-22 DIAGNOSIS — Z419 Encounter for procedure for purposes other than remedying health state, unspecified: Secondary | ICD-10-CM | POA: Diagnosis not present

## 2021-07-22 DIAGNOSIS — E2740 Unspecified adrenocortical insufficiency: Secondary | ICD-10-CM | POA: Diagnosis not present

## 2021-07-25 DIAGNOSIS — R0789 Other chest pain: Secondary | ICD-10-CM | POA: Diagnosis not present

## 2021-07-25 DIAGNOSIS — I469 Cardiac arrest, cause unspecified: Secondary | ICD-10-CM | POA: Diagnosis not present

## 2021-07-25 DIAGNOSIS — R1111 Vomiting without nausea: Secondary | ICD-10-CM | POA: Diagnosis not present

## 2021-07-25 DIAGNOSIS — N179 Acute kidney failure, unspecified: Secondary | ICD-10-CM | POA: Diagnosis not present

## 2021-07-25 DIAGNOSIS — D631 Anemia in chronic kidney disease: Secondary | ICD-10-CM | POA: Diagnosis not present

## 2021-07-25 DIAGNOSIS — E1042 Type 1 diabetes mellitus with diabetic polyneuropathy: Secondary | ICD-10-CM | POA: Diagnosis not present

## 2021-07-25 DIAGNOSIS — K3184 Gastroparesis: Secondary | ICD-10-CM | POA: Diagnosis not present

## 2021-07-25 DIAGNOSIS — R1312 Dysphagia, oropharyngeal phase: Secondary | ICD-10-CM | POA: Diagnosis not present

## 2021-07-25 DIAGNOSIS — F32A Depression, unspecified: Secondary | ICD-10-CM | POA: Diagnosis not present

## 2021-07-25 DIAGNOSIS — R7881 Bacteremia: Secondary | ICD-10-CM | POA: Diagnosis not present

## 2021-07-25 DIAGNOSIS — I5023 Acute on chronic systolic (congestive) heart failure: Secondary | ICD-10-CM | POA: Diagnosis not present

## 2021-07-25 DIAGNOSIS — I10 Essential (primary) hypertension: Secondary | ICD-10-CM | POA: Diagnosis not present

## 2021-07-25 DIAGNOSIS — R112 Nausea with vomiting, unspecified: Secondary | ICD-10-CM | POA: Diagnosis not present

## 2021-07-25 DIAGNOSIS — R0602 Shortness of breath: Secondary | ICD-10-CM | POA: Diagnosis not present

## 2021-07-25 DIAGNOSIS — O903 Peripartum cardiomyopathy: Secondary | ICD-10-CM | POA: Diagnosis not present

## 2021-07-25 DIAGNOSIS — A0472 Enterocolitis due to Clostridium difficile, not specified as recurrent: Secondary | ICD-10-CM | POA: Diagnosis not present

## 2021-07-25 DIAGNOSIS — J9601 Acute respiratory failure with hypoxia: Secondary | ICD-10-CM | POA: Diagnosis not present

## 2021-07-25 DIAGNOSIS — R079 Chest pain, unspecified: Secondary | ICD-10-CM | POA: Diagnosis not present

## 2021-07-25 DIAGNOSIS — R110 Nausea: Secondary | ICD-10-CM | POA: Diagnosis not present

## 2021-07-25 DIAGNOSIS — D696 Thrombocytopenia, unspecified: Secondary | ICD-10-CM | POA: Diagnosis not present

## 2021-07-25 DIAGNOSIS — J189 Pneumonia, unspecified organism: Secondary | ICD-10-CM | POA: Diagnosis not present

## 2021-07-25 DIAGNOSIS — R0600 Dyspnea, unspecified: Secondary | ICD-10-CM | POA: Diagnosis not present

## 2021-07-25 DIAGNOSIS — I130 Hypertensive heart and chronic kidney disease with heart failure and stage 1 through stage 4 chronic kidney disease, or unspecified chronic kidney disease: Secondary | ICD-10-CM | POA: Diagnosis not present

## 2021-07-26 DIAGNOSIS — E039 Hypothyroidism, unspecified: Secondary | ICD-10-CM | POA: Diagnosis not present

## 2021-07-26 DIAGNOSIS — E1065 Type 1 diabetes mellitus with hyperglycemia: Secondary | ICD-10-CM | POA: Diagnosis not present

## 2021-07-26 DIAGNOSIS — E1010 Type 1 diabetes mellitus with ketoacidosis without coma: Secondary | ICD-10-CM | POA: Diagnosis not present

## 2021-07-26 DIAGNOSIS — I5023 Acute on chronic systolic (congestive) heart failure: Secondary | ICD-10-CM | POA: Diagnosis not present

## 2021-07-26 DIAGNOSIS — E2740 Unspecified adrenocortical insufficiency: Secondary | ICD-10-CM | POA: Diagnosis not present

## 2021-07-26 DIAGNOSIS — E038 Other specified hypothyroidism: Secondary | ICD-10-CM | POA: Diagnosis not present

## 2021-07-26 DIAGNOSIS — N179 Acute kidney failure, unspecified: Secondary | ICD-10-CM | POA: Diagnosis not present

## 2021-07-27 DIAGNOSIS — R0600 Dyspnea, unspecified: Secondary | ICD-10-CM | POA: Diagnosis not present

## 2021-07-27 DIAGNOSIS — E1022 Type 1 diabetes mellitus with diabetic chronic kidney disease: Secondary | ICD-10-CM | POA: Diagnosis not present

## 2021-07-27 DIAGNOSIS — I130 Hypertensive heart and chronic kidney disease with heart failure and stage 1 through stage 4 chronic kidney disease, or unspecified chronic kidney disease: Secondary | ICD-10-CM | POA: Diagnosis not present

## 2021-07-27 DIAGNOSIS — E2740 Unspecified adrenocortical insufficiency: Secondary | ICD-10-CM | POA: Diagnosis not present

## 2021-07-27 DIAGNOSIS — E038 Other specified hypothyroidism: Secondary | ICD-10-CM | POA: Diagnosis not present

## 2021-07-27 DIAGNOSIS — J9601 Acute respiratory failure with hypoxia: Secondary | ICD-10-CM | POA: Diagnosis not present

## 2021-07-27 DIAGNOSIS — N183 Chronic kidney disease, stage 3 unspecified: Secondary | ICD-10-CM | POA: Diagnosis not present

## 2021-07-27 DIAGNOSIS — R9431 Abnormal electrocardiogram [ECG] [EKG]: Secondary | ICD-10-CM | POA: Diagnosis not present

## 2021-07-27 DIAGNOSIS — E8721 Acute metabolic acidosis: Secondary | ICD-10-CM | POA: Diagnosis not present

## 2021-07-27 DIAGNOSIS — Z4682 Encounter for fitting and adjustment of non-vascular catheter: Secondary | ICD-10-CM | POA: Diagnosis not present

## 2021-07-27 DIAGNOSIS — R4182 Altered mental status, unspecified: Secondary | ICD-10-CM | POA: Diagnosis not present

## 2021-07-27 DIAGNOSIS — E1010 Type 1 diabetes mellitus with ketoacidosis without coma: Secondary | ICD-10-CM | POA: Diagnosis not present

## 2021-07-27 DIAGNOSIS — T8189XA Other complications of procedures, not elsewhere classified, initial encounter: Secondary | ICD-10-CM | POA: Diagnosis not present

## 2021-07-27 DIAGNOSIS — N179 Acute kidney failure, unspecified: Secondary | ICD-10-CM | POA: Diagnosis not present

## 2021-07-27 DIAGNOSIS — R188 Other ascites: Secondary | ICD-10-CM | POA: Diagnosis not present

## 2021-07-27 DIAGNOSIS — L02211 Cutaneous abscess of abdominal wall: Secondary | ICD-10-CM | POA: Diagnosis not present

## 2021-07-27 DIAGNOSIS — I5023 Acute on chronic systolic (congestive) heart failure: Secondary | ICD-10-CM | POA: Diagnosis not present

## 2021-07-27 DIAGNOSIS — J90 Pleural effusion, not elsewhere classified: Secondary | ICD-10-CM | POA: Diagnosis not present

## 2021-07-28 DIAGNOSIS — N183 Chronic kidney disease, stage 3 unspecified: Secondary | ICD-10-CM | POA: Diagnosis not present

## 2021-07-28 DIAGNOSIS — J123 Human metapneumovirus pneumonia: Secondary | ICD-10-CM | POA: Diagnosis not present

## 2021-07-28 DIAGNOSIS — E039 Hypothyroidism, unspecified: Secondary | ICD-10-CM | POA: Diagnosis not present

## 2021-07-28 DIAGNOSIS — E1022 Type 1 diabetes mellitus with diabetic chronic kidney disease: Secondary | ICD-10-CM | POA: Diagnosis not present

## 2021-07-28 DIAGNOSIS — J189 Pneumonia, unspecified organism: Secondary | ICD-10-CM | POA: Diagnosis not present

## 2021-07-28 DIAGNOSIS — E8721 Acute metabolic acidosis: Secondary | ICD-10-CM | POA: Diagnosis not present

## 2021-07-28 DIAGNOSIS — R0600 Dyspnea, unspecified: Secondary | ICD-10-CM | POA: Diagnosis not present

## 2021-07-28 DIAGNOSIS — E1010 Type 1 diabetes mellitus with ketoacidosis without coma: Secondary | ICD-10-CM | POA: Diagnosis not present

## 2021-07-28 DIAGNOSIS — E1042 Type 1 diabetes mellitus with diabetic polyneuropathy: Secondary | ICD-10-CM | POA: Diagnosis not present

## 2021-07-28 DIAGNOSIS — L03116 Cellulitis of left lower limb: Secondary | ICD-10-CM | POA: Diagnosis not present

## 2021-07-28 DIAGNOSIS — J9601 Acute respiratory failure with hypoxia: Secondary | ICD-10-CM | POA: Diagnosis not present

## 2021-07-28 DIAGNOSIS — I5020 Unspecified systolic (congestive) heart failure: Secondary | ICD-10-CM | POA: Diagnosis not present

## 2021-07-28 DIAGNOSIS — E2740 Unspecified adrenocortical insufficiency: Secondary | ICD-10-CM | POA: Diagnosis not present

## 2021-07-28 DIAGNOSIS — R1312 Dysphagia, oropharyngeal phase: Secondary | ICD-10-CM | POA: Diagnosis not present

## 2021-07-28 DIAGNOSIS — I5023 Acute on chronic systolic (congestive) heart failure: Secondary | ICD-10-CM | POA: Diagnosis not present

## 2021-07-28 DIAGNOSIS — N179 Acute kidney failure, unspecified: Secondary | ICD-10-CM | POA: Diagnosis not present

## 2021-07-28 DIAGNOSIS — I130 Hypertensive heart and chronic kidney disease with heart failure and stage 1 through stage 4 chronic kidney disease, or unspecified chronic kidney disease: Secondary | ICD-10-CM | POA: Diagnosis not present

## 2021-07-29 DIAGNOSIS — N179 Acute kidney failure, unspecified: Secondary | ICD-10-CM | POA: Diagnosis not present

## 2021-07-29 DIAGNOSIS — E1010 Type 1 diabetes mellitus with ketoacidosis without coma: Secondary | ICD-10-CM | POA: Diagnosis not present

## 2021-07-29 DIAGNOSIS — J123 Human metapneumovirus pneumonia: Secondary | ICD-10-CM | POA: Diagnosis not present

## 2021-07-29 DIAGNOSIS — E1042 Type 1 diabetes mellitus with diabetic polyneuropathy: Secondary | ICD-10-CM | POA: Diagnosis not present

## 2021-07-29 DIAGNOSIS — N183 Chronic kidney disease, stage 3 unspecified: Secondary | ICD-10-CM | POA: Diagnosis not present

## 2021-07-29 DIAGNOSIS — I130 Hypertensive heart and chronic kidney disease with heart failure and stage 1 through stage 4 chronic kidney disease, or unspecified chronic kidney disease: Secondary | ICD-10-CM | POA: Diagnosis not present

## 2021-07-29 DIAGNOSIS — E1022 Type 1 diabetes mellitus with diabetic chronic kidney disease: Secondary | ICD-10-CM | POA: Diagnosis not present

## 2021-07-29 DIAGNOSIS — L03116 Cellulitis of left lower limb: Secondary | ICD-10-CM | POA: Diagnosis not present

## 2021-07-29 DIAGNOSIS — E039 Hypothyroidism, unspecified: Secondary | ICD-10-CM | POA: Diagnosis not present

## 2021-07-29 DIAGNOSIS — R0600 Dyspnea, unspecified: Secondary | ICD-10-CM | POA: Diagnosis not present

## 2021-07-29 DIAGNOSIS — J90 Pleural effusion, not elsewhere classified: Secondary | ICD-10-CM | POA: Diagnosis not present

## 2021-07-29 DIAGNOSIS — E8721 Acute metabolic acidosis: Secondary | ICD-10-CM | POA: Diagnosis not present

## 2021-07-29 DIAGNOSIS — I5023 Acute on chronic systolic (congestive) heart failure: Secondary | ICD-10-CM | POA: Diagnosis not present

## 2021-07-29 DIAGNOSIS — E2740 Unspecified adrenocortical insufficiency: Secondary | ICD-10-CM | POA: Diagnosis not present

## 2021-07-29 DIAGNOSIS — J189 Pneumonia, unspecified organism: Secondary | ICD-10-CM | POA: Diagnosis not present

## 2021-07-29 DIAGNOSIS — R1312 Dysphagia, oropharyngeal phase: Secondary | ICD-10-CM | POA: Diagnosis not present

## 2021-07-29 DIAGNOSIS — J9601 Acute respiratory failure with hypoxia: Secondary | ICD-10-CM | POA: Diagnosis not present

## 2021-07-29 DIAGNOSIS — I5020 Unspecified systolic (congestive) heart failure: Secondary | ICD-10-CM | POA: Diagnosis not present

## 2021-07-30 DIAGNOSIS — N183 Chronic kidney disease, stage 3 unspecified: Secondary | ICD-10-CM | POA: Diagnosis not present

## 2021-07-30 DIAGNOSIS — E1042 Type 1 diabetes mellitus with diabetic polyneuropathy: Secondary | ICD-10-CM | POA: Diagnosis not present

## 2021-07-30 DIAGNOSIS — J9601 Acute respiratory failure with hypoxia: Secondary | ICD-10-CM | POA: Diagnosis not present

## 2021-07-30 DIAGNOSIS — N179 Acute kidney failure, unspecified: Secondary | ICD-10-CM | POA: Diagnosis not present

## 2021-07-30 DIAGNOSIS — E1022 Type 1 diabetes mellitus with diabetic chronic kidney disease: Secondary | ICD-10-CM | POA: Diagnosis not present

## 2021-07-30 DIAGNOSIS — L03116 Cellulitis of left lower limb: Secondary | ICD-10-CM | POA: Diagnosis not present

## 2021-07-30 DIAGNOSIS — J123 Human metapneumovirus pneumonia: Secondary | ICD-10-CM | POA: Diagnosis not present

## 2021-07-30 DIAGNOSIS — J189 Pneumonia, unspecified organism: Secondary | ICD-10-CM | POA: Diagnosis not present

## 2021-07-30 DIAGNOSIS — E8721 Acute metabolic acidosis: Secondary | ICD-10-CM | POA: Diagnosis not present

## 2021-07-30 DIAGNOSIS — E2740 Unspecified adrenocortical insufficiency: Secondary | ICD-10-CM | POA: Diagnosis not present

## 2021-07-30 DIAGNOSIS — I5020 Unspecified systolic (congestive) heart failure: Secondary | ICD-10-CM | POA: Diagnosis not present

## 2021-07-30 DIAGNOSIS — I130 Hypertensive heart and chronic kidney disease with heart failure and stage 1 through stage 4 chronic kidney disease, or unspecified chronic kidney disease: Secondary | ICD-10-CM | POA: Diagnosis not present

## 2021-07-30 DIAGNOSIS — R1312 Dysphagia, oropharyngeal phase: Secondary | ICD-10-CM | POA: Diagnosis not present

## 2021-07-30 DIAGNOSIS — E1010 Type 1 diabetes mellitus with ketoacidosis without coma: Secondary | ICD-10-CM | POA: Diagnosis not present

## 2021-07-30 DIAGNOSIS — R0600 Dyspnea, unspecified: Secondary | ICD-10-CM | POA: Diagnosis not present

## 2021-07-30 DIAGNOSIS — E039 Hypothyroidism, unspecified: Secondary | ICD-10-CM | POA: Diagnosis not present

## 2021-07-30 DIAGNOSIS — R1310 Dysphagia, unspecified: Secondary | ICD-10-CM | POA: Diagnosis not present

## 2021-07-30 DIAGNOSIS — I5023 Acute on chronic systolic (congestive) heart failure: Secondary | ICD-10-CM | POA: Diagnosis not present

## 2021-07-31 DIAGNOSIS — I469 Cardiac arrest, cause unspecified: Secondary | ICD-10-CM | POA: Diagnosis not present

## 2021-07-31 DIAGNOSIS — I5020 Unspecified systolic (congestive) heart failure: Secondary | ICD-10-CM | POA: Diagnosis not present

## 2021-07-31 DIAGNOSIS — J123 Human metapneumovirus pneumonia: Secondary | ICD-10-CM | POA: Diagnosis not present

## 2021-07-31 DIAGNOSIS — I5023 Acute on chronic systolic (congestive) heart failure: Secondary | ICD-10-CM | POA: Diagnosis not present

## 2021-07-31 DIAGNOSIS — N183 Chronic kidney disease, stage 3 unspecified: Secondary | ICD-10-CM | POA: Diagnosis not present

## 2021-07-31 DIAGNOSIS — J189 Pneumonia, unspecified organism: Secondary | ICD-10-CM | POA: Diagnosis not present

## 2021-07-31 DIAGNOSIS — N179 Acute kidney failure, unspecified: Secondary | ICD-10-CM | POA: Diagnosis not present

## 2021-07-31 DIAGNOSIS — I130 Hypertensive heart and chronic kidney disease with heart failure and stage 1 through stage 4 chronic kidney disease, or unspecified chronic kidney disease: Secondary | ICD-10-CM | POA: Diagnosis not present

## 2021-07-31 DIAGNOSIS — L03116 Cellulitis of left lower limb: Secondary | ICD-10-CM | POA: Diagnosis not present

## 2021-07-31 DIAGNOSIS — J90 Pleural effusion, not elsewhere classified: Secondary | ICD-10-CM | POA: Diagnosis not present

## 2021-07-31 DIAGNOSIS — E1042 Type 1 diabetes mellitus with diabetic polyneuropathy: Secondary | ICD-10-CM | POA: Diagnosis not present

## 2021-07-31 DIAGNOSIS — E1010 Type 1 diabetes mellitus with ketoacidosis without coma: Secondary | ICD-10-CM | POA: Diagnosis not present

## 2021-07-31 DIAGNOSIS — K209 Esophagitis, unspecified without bleeding: Secondary | ICD-10-CM | POA: Diagnosis not present

## 2021-07-31 DIAGNOSIS — E2740 Unspecified adrenocortical insufficiency: Secondary | ICD-10-CM | POA: Diagnosis not present

## 2021-07-31 DIAGNOSIS — R0600 Dyspnea, unspecified: Secondary | ICD-10-CM | POA: Diagnosis not present

## 2021-07-31 DIAGNOSIS — J9601 Acute respiratory failure with hypoxia: Secondary | ICD-10-CM | POA: Diagnosis not present

## 2021-07-31 DIAGNOSIS — Z4682 Encounter for fitting and adjustment of non-vascular catheter: Secondary | ICD-10-CM | POA: Diagnosis not present

## 2021-07-31 DIAGNOSIS — E108 Type 1 diabetes mellitus with unspecified complications: Secondary | ICD-10-CM | POA: Diagnosis not present

## 2021-07-31 DIAGNOSIS — E1022 Type 1 diabetes mellitus with diabetic chronic kidney disease: Secondary | ICD-10-CM | POA: Diagnosis not present

## 2021-07-31 DIAGNOSIS — E8721 Acute metabolic acidosis: Secondary | ICD-10-CM | POA: Diagnosis not present

## 2021-07-31 DIAGNOSIS — K2210 Ulcer of esophagus without bleeding: Secondary | ICD-10-CM | POA: Diagnosis not present

## 2021-07-31 DIAGNOSIS — I4581 Long QT syndrome: Secondary | ICD-10-CM | POA: Diagnosis not present

## 2021-07-31 DIAGNOSIS — R4182 Altered mental status, unspecified: Secondary | ICD-10-CM | POA: Diagnosis not present

## 2021-07-31 DIAGNOSIS — R1310 Dysphagia, unspecified: Secondary | ICD-10-CM | POA: Diagnosis not present

## 2021-07-31 DIAGNOSIS — R188 Other ascites: Secondary | ICD-10-CM | POA: Diagnosis not present

## 2021-07-31 DIAGNOSIS — L02211 Cutaneous abscess of abdominal wall: Secondary | ICD-10-CM | POA: Diagnosis not present

## 2021-07-31 DIAGNOSIS — T8189XA Other complications of procedures, not elsewhere classified, initial encounter: Secondary | ICD-10-CM | POA: Diagnosis not present

## 2021-07-31 DIAGNOSIS — I428 Other cardiomyopathies: Secondary | ICD-10-CM | POA: Diagnosis not present

## 2021-07-31 DIAGNOSIS — Z7189 Other specified counseling: Secondary | ICD-10-CM | POA: Diagnosis not present

## 2021-07-31 DIAGNOSIS — E039 Hypothyroidism, unspecified: Secondary | ICD-10-CM | POA: Diagnosis not present

## 2021-07-31 DIAGNOSIS — Z794 Long term (current) use of insulin: Secondary | ICD-10-CM | POA: Diagnosis not present

## 2021-08-01 DIAGNOSIS — Z8674 Personal history of sudden cardiac arrest: Secondary | ICD-10-CM | POA: Diagnosis not present

## 2021-08-01 DIAGNOSIS — J90 Pleural effusion, not elsewhere classified: Secondary | ICD-10-CM | POA: Diagnosis not present

## 2021-08-01 DIAGNOSIS — J9601 Acute respiratory failure with hypoxia: Secondary | ICD-10-CM | POA: Diagnosis not present

## 2021-08-01 DIAGNOSIS — J123 Human metapneumovirus pneumonia: Secondary | ICD-10-CM | POA: Diagnosis not present

## 2021-08-01 DIAGNOSIS — E2740 Unspecified adrenocortical insufficiency: Secondary | ICD-10-CM | POA: Diagnosis not present

## 2021-08-01 DIAGNOSIS — E039 Hypothyroidism, unspecified: Secondary | ICD-10-CM | POA: Diagnosis not present

## 2021-08-01 DIAGNOSIS — Z4682 Encounter for fitting and adjustment of non-vascular catheter: Secondary | ICD-10-CM | POA: Diagnosis not present

## 2021-08-01 DIAGNOSIS — E1022 Type 1 diabetes mellitus with diabetic chronic kidney disease: Secondary | ICD-10-CM | POA: Diagnosis not present

## 2021-08-01 DIAGNOSIS — R4182 Altered mental status, unspecified: Secondary | ICD-10-CM | POA: Diagnosis not present

## 2021-08-01 DIAGNOSIS — I3139 Other pericardial effusion (noninflammatory): Secondary | ICD-10-CM | POA: Diagnosis not present

## 2021-08-01 DIAGNOSIS — E1010 Type 1 diabetes mellitus with ketoacidosis without coma: Secondary | ICD-10-CM | POA: Diagnosis not present

## 2021-08-01 DIAGNOSIS — I5020 Unspecified systolic (congestive) heart failure: Secondary | ICD-10-CM | POA: Diagnosis not present

## 2021-08-01 DIAGNOSIS — I5023 Acute on chronic systolic (congestive) heart failure: Secondary | ICD-10-CM | POA: Diagnosis not present

## 2021-08-01 DIAGNOSIS — L02211 Cutaneous abscess of abdominal wall: Secondary | ICD-10-CM | POA: Diagnosis not present

## 2021-08-01 DIAGNOSIS — N183 Chronic kidney disease, stage 3 unspecified: Secondary | ICD-10-CM | POA: Diagnosis not present

## 2021-08-01 DIAGNOSIS — I130 Hypertensive heart and chronic kidney disease with heart failure and stage 1 through stage 4 chronic kidney disease, or unspecified chronic kidney disease: Secondary | ICD-10-CM | POA: Diagnosis not present

## 2021-08-01 DIAGNOSIS — L03116 Cellulitis of left lower limb: Secondary | ICD-10-CM | POA: Diagnosis not present

## 2021-08-01 DIAGNOSIS — R0600 Dyspnea, unspecified: Secondary | ICD-10-CM | POA: Diagnosis not present

## 2021-08-01 DIAGNOSIS — E1042 Type 1 diabetes mellitus with diabetic polyneuropathy: Secondary | ICD-10-CM | POA: Diagnosis not present

## 2021-08-01 DIAGNOSIS — T8189XA Other complications of procedures, not elsewhere classified, initial encounter: Secondary | ICD-10-CM | POA: Diagnosis not present

## 2021-08-01 DIAGNOSIS — N179 Acute kidney failure, unspecified: Secondary | ICD-10-CM | POA: Diagnosis not present

## 2021-08-01 DIAGNOSIS — I428 Other cardiomyopathies: Secondary | ICD-10-CM | POA: Diagnosis not present

## 2021-08-01 DIAGNOSIS — I5030 Unspecified diastolic (congestive) heart failure: Secondary | ICD-10-CM | POA: Diagnosis not present

## 2021-08-01 DIAGNOSIS — R188 Other ascites: Secondary | ICD-10-CM | POA: Diagnosis not present

## 2021-08-01 DIAGNOSIS — E8721 Acute metabolic acidosis: Secondary | ICD-10-CM | POA: Diagnosis not present

## 2021-08-01 DIAGNOSIS — R0789 Other chest pain: Secondary | ICD-10-CM | POA: Diagnosis not present

## 2021-08-02 DIAGNOSIS — L02211 Cutaneous abscess of abdominal wall: Secondary | ICD-10-CM | POA: Diagnosis not present

## 2021-08-02 DIAGNOSIS — Z7189 Other specified counseling: Secondary | ICD-10-CM | POA: Diagnosis not present

## 2021-08-02 DIAGNOSIS — J9601 Acute respiratory failure with hypoxia: Secondary | ICD-10-CM | POA: Diagnosis not present

## 2021-08-02 DIAGNOSIS — J123 Human metapneumovirus pneumonia: Secondary | ICD-10-CM | POA: Diagnosis not present

## 2021-08-02 DIAGNOSIS — N183 Chronic kidney disease, stage 3 unspecified: Secondary | ICD-10-CM | POA: Diagnosis not present

## 2021-08-02 DIAGNOSIS — Z4682 Encounter for fitting and adjustment of non-vascular catheter: Secondary | ICD-10-CM | POA: Diagnosis not present

## 2021-08-02 DIAGNOSIS — R188 Other ascites: Secondary | ICD-10-CM | POA: Diagnosis not present

## 2021-08-02 DIAGNOSIS — I5020 Unspecified systolic (congestive) heart failure: Secondary | ICD-10-CM | POA: Diagnosis not present

## 2021-08-02 DIAGNOSIS — R0600 Dyspnea, unspecified: Secondary | ICD-10-CM | POA: Diagnosis not present

## 2021-08-02 DIAGNOSIS — K219 Gastro-esophageal reflux disease without esophagitis: Secondary | ICD-10-CM | POA: Diagnosis not present

## 2021-08-02 DIAGNOSIS — I469 Cardiac arrest, cause unspecified: Secondary | ICD-10-CM | POA: Diagnosis not present

## 2021-08-02 DIAGNOSIS — I3139 Other pericardial effusion (noninflammatory): Secondary | ICD-10-CM | POA: Diagnosis not present

## 2021-08-02 DIAGNOSIS — I5023 Acute on chronic systolic (congestive) heart failure: Secondary | ICD-10-CM | POA: Diagnosis not present

## 2021-08-02 DIAGNOSIS — I5030 Unspecified diastolic (congestive) heart failure: Secondary | ICD-10-CM | POA: Diagnosis not present

## 2021-08-02 DIAGNOSIS — E108 Type 1 diabetes mellitus with unspecified complications: Secondary | ICD-10-CM | POA: Diagnosis not present

## 2021-08-02 DIAGNOSIS — E2740 Unspecified adrenocortical insufficiency: Secondary | ICD-10-CM | POA: Diagnosis not present

## 2021-08-02 DIAGNOSIS — Z794 Long term (current) use of insulin: Secondary | ICD-10-CM | POA: Diagnosis not present

## 2021-08-02 DIAGNOSIS — I428 Other cardiomyopathies: Secondary | ICD-10-CM | POA: Diagnosis not present

## 2021-08-02 DIAGNOSIS — E8721 Acute metabolic acidosis: Secondary | ICD-10-CM | POA: Diagnosis not present

## 2021-08-02 DIAGNOSIS — E1042 Type 1 diabetes mellitus with diabetic polyneuropathy: Secondary | ICD-10-CM | POA: Diagnosis not present

## 2021-08-02 DIAGNOSIS — T8189XA Other complications of procedures, not elsewhere classified, initial encounter: Secondary | ICD-10-CM | POA: Diagnosis not present

## 2021-08-02 DIAGNOSIS — I130 Hypertensive heart and chronic kidney disease with heart failure and stage 1 through stage 4 chronic kidney disease, or unspecified chronic kidney disease: Secondary | ICD-10-CM | POA: Diagnosis not present

## 2021-08-02 DIAGNOSIS — I4581 Long QT syndrome: Secondary | ICD-10-CM | POA: Diagnosis not present

## 2021-08-02 DIAGNOSIS — N179 Acute kidney failure, unspecified: Secondary | ICD-10-CM | POA: Diagnosis not present

## 2021-08-02 DIAGNOSIS — L03116 Cellulitis of left lower limb: Secondary | ICD-10-CM | POA: Diagnosis not present

## 2021-08-02 DIAGNOSIS — E039 Hypothyroidism, unspecified: Secondary | ICD-10-CM | POA: Diagnosis not present

## 2021-08-02 DIAGNOSIS — Z8674 Personal history of sudden cardiac arrest: Secondary | ICD-10-CM | POA: Diagnosis not present

## 2021-08-02 DIAGNOSIS — R4182 Altered mental status, unspecified: Secondary | ICD-10-CM | POA: Diagnosis not present

## 2021-08-02 DIAGNOSIS — E1022 Type 1 diabetes mellitus with diabetic chronic kidney disease: Secondary | ICD-10-CM | POA: Diagnosis not present

## 2021-08-02 DIAGNOSIS — J90 Pleural effusion, not elsewhere classified: Secondary | ICD-10-CM | POA: Diagnosis not present

## 2021-08-03 DIAGNOSIS — E1022 Type 1 diabetes mellitus with diabetic chronic kidney disease: Secondary | ICD-10-CM | POA: Diagnosis not present

## 2021-08-03 DIAGNOSIS — R633 Feeding difficulties, unspecified: Secondary | ICD-10-CM | POA: Diagnosis not present

## 2021-08-03 DIAGNOSIS — R1312 Dysphagia, oropharyngeal phase: Secondary | ICD-10-CM | POA: Diagnosis not present

## 2021-08-03 DIAGNOSIS — Z1621 Resistance to vancomycin: Secondary | ICD-10-CM | POA: Diagnosis not present

## 2021-08-03 DIAGNOSIS — J9601 Acute respiratory failure with hypoxia: Secondary | ICD-10-CM | POA: Diagnosis not present

## 2021-08-03 DIAGNOSIS — I5023 Acute on chronic systolic (congestive) heart failure: Secondary | ICD-10-CM | POA: Diagnosis not present

## 2021-08-03 DIAGNOSIS — R7881 Bacteremia: Secondary | ICD-10-CM | POA: Diagnosis not present

## 2021-08-03 DIAGNOSIS — E8721 Acute metabolic acidosis: Secondary | ICD-10-CM | POA: Diagnosis not present

## 2021-08-03 DIAGNOSIS — I5020 Unspecified systolic (congestive) heart failure: Secondary | ICD-10-CM | POA: Diagnosis not present

## 2021-08-03 DIAGNOSIS — J90 Pleural effusion, not elsewhere classified: Secondary | ICD-10-CM | POA: Diagnosis not present

## 2021-08-03 DIAGNOSIS — E2740 Unspecified adrenocortical insufficiency: Secondary | ICD-10-CM | POA: Diagnosis not present

## 2021-08-03 DIAGNOSIS — R0600 Dyspnea, unspecified: Secondary | ICD-10-CM | POA: Diagnosis not present

## 2021-08-03 DIAGNOSIS — I469 Cardiac arrest, cause unspecified: Secondary | ICD-10-CM | POA: Diagnosis not present

## 2021-08-03 DIAGNOSIS — I3139 Other pericardial effusion (noninflammatory): Secondary | ICD-10-CM | POA: Diagnosis not present

## 2021-08-03 DIAGNOSIS — E1010 Type 1 diabetes mellitus with ketoacidosis without coma: Secondary | ICD-10-CM | POA: Diagnosis not present

## 2021-08-03 DIAGNOSIS — I130 Hypertensive heart and chronic kidney disease with heart failure and stage 1 through stage 4 chronic kidney disease, or unspecified chronic kidney disease: Secondary | ICD-10-CM | POA: Diagnosis not present

## 2021-08-03 DIAGNOSIS — I428 Other cardiomyopathies: Secondary | ICD-10-CM | POA: Diagnosis not present

## 2021-08-03 DIAGNOSIS — N183 Chronic kidney disease, stage 3 unspecified: Secondary | ICD-10-CM | POA: Diagnosis not present

## 2021-08-03 DIAGNOSIS — Z8674 Personal history of sudden cardiac arrest: Secondary | ICD-10-CM | POA: Diagnosis not present

## 2021-08-03 DIAGNOSIS — L03116 Cellulitis of left lower limb: Secondary | ICD-10-CM | POA: Diagnosis not present

## 2021-08-03 DIAGNOSIS — B952 Enterococcus as the cause of diseases classified elsewhere: Secondary | ICD-10-CM | POA: Diagnosis not present

## 2021-08-03 DIAGNOSIS — J123 Human metapneumovirus pneumonia: Secondary | ICD-10-CM | POA: Diagnosis not present

## 2021-08-03 DIAGNOSIS — K219 Gastro-esophageal reflux disease without esophagitis: Secondary | ICD-10-CM | POA: Diagnosis not present

## 2021-08-03 DIAGNOSIS — N179 Acute kidney failure, unspecified: Secondary | ICD-10-CM | POA: Diagnosis not present

## 2021-08-03 DIAGNOSIS — R1310 Dysphagia, unspecified: Secondary | ICD-10-CM | POA: Diagnosis not present

## 2021-08-03 DIAGNOSIS — I4581 Long QT syndrome: Secondary | ICD-10-CM | POA: Diagnosis not present

## 2021-08-04 DIAGNOSIS — I5023 Acute on chronic systolic (congestive) heart failure: Secondary | ICD-10-CM | POA: Diagnosis not present

## 2021-08-04 DIAGNOSIS — J90 Pleural effusion, not elsewhere classified: Secondary | ICD-10-CM | POA: Diagnosis not present

## 2021-08-04 DIAGNOSIS — R1312 Dysphagia, oropharyngeal phase: Secondary | ICD-10-CM | POA: Diagnosis not present

## 2021-08-04 DIAGNOSIS — I469 Cardiac arrest, cause unspecified: Secondary | ICD-10-CM | POA: Diagnosis not present

## 2021-08-04 DIAGNOSIS — Z9114 Patient's other noncompliance with medication regimen: Secondary | ICD-10-CM | POA: Diagnosis not present

## 2021-08-04 DIAGNOSIS — R633 Feeding difficulties, unspecified: Secondary | ICD-10-CM | POA: Diagnosis not present

## 2021-08-04 DIAGNOSIS — R7881 Bacteremia: Secondary | ICD-10-CM | POA: Diagnosis not present

## 2021-08-04 DIAGNOSIS — B952 Enterococcus as the cause of diseases classified elsewhere: Secondary | ICD-10-CM | POA: Diagnosis not present

## 2021-08-04 DIAGNOSIS — I5020 Unspecified systolic (congestive) heart failure: Secondary | ICD-10-CM | POA: Diagnosis not present

## 2021-08-04 DIAGNOSIS — E8721 Acute metabolic acidosis: Secondary | ICD-10-CM | POA: Diagnosis not present

## 2021-08-04 DIAGNOSIS — N179 Acute kidney failure, unspecified: Secondary | ICD-10-CM | POA: Diagnosis not present

## 2021-08-04 DIAGNOSIS — R0600 Dyspnea, unspecified: Secondary | ICD-10-CM | POA: Diagnosis not present

## 2021-08-04 DIAGNOSIS — Z8674 Personal history of sudden cardiac arrest: Secondary | ICD-10-CM | POA: Diagnosis not present

## 2021-08-04 DIAGNOSIS — R1310 Dysphagia, unspecified: Secondary | ICD-10-CM | POA: Diagnosis not present

## 2021-08-04 DIAGNOSIS — N183 Chronic kidney disease, stage 3 unspecified: Secondary | ICD-10-CM | POA: Diagnosis not present

## 2021-08-04 DIAGNOSIS — E1010 Type 1 diabetes mellitus with ketoacidosis without coma: Secondary | ICD-10-CM | POA: Diagnosis not present

## 2021-08-04 DIAGNOSIS — E2740 Unspecified adrenocortical insufficiency: Secondary | ICD-10-CM | POA: Diagnosis not present

## 2021-08-04 DIAGNOSIS — J9601 Acute respiratory failure with hypoxia: Secondary | ICD-10-CM | POA: Diagnosis not present

## 2021-08-04 DIAGNOSIS — O903 Peripartum cardiomyopathy: Secondary | ICD-10-CM | POA: Diagnosis not present

## 2021-08-04 DIAGNOSIS — I130 Hypertensive heart and chronic kidney disease with heart failure and stage 1 through stage 4 chronic kidney disease, or unspecified chronic kidney disease: Secondary | ICD-10-CM | POA: Diagnosis not present

## 2021-08-04 DIAGNOSIS — E1022 Type 1 diabetes mellitus with diabetic chronic kidney disease: Secondary | ICD-10-CM | POA: Diagnosis not present

## 2021-08-04 DIAGNOSIS — E039 Hypothyroidism, unspecified: Secondary | ICD-10-CM | POA: Diagnosis not present

## 2021-08-05 DIAGNOSIS — I130 Hypertensive heart and chronic kidney disease with heart failure and stage 1 through stage 4 chronic kidney disease, or unspecified chronic kidney disease: Secondary | ICD-10-CM | POA: Diagnosis not present

## 2021-08-05 DIAGNOSIS — D638 Anemia in other chronic diseases classified elsewhere: Secondary | ICD-10-CM | POA: Diagnosis not present

## 2021-08-05 DIAGNOSIS — I5023 Acute on chronic systolic (congestive) heart failure: Secondary | ICD-10-CM | POA: Diagnosis not present

## 2021-08-05 DIAGNOSIS — E108 Type 1 diabetes mellitus with unspecified complications: Secondary | ICD-10-CM | POA: Diagnosis not present

## 2021-08-05 DIAGNOSIS — R7881 Bacteremia: Secondary | ICD-10-CM | POA: Diagnosis not present

## 2021-08-05 DIAGNOSIS — E8721 Acute metabolic acidosis: Secondary | ICD-10-CM | POA: Diagnosis not present

## 2021-08-05 DIAGNOSIS — E1022 Type 1 diabetes mellitus with diabetic chronic kidney disease: Secondary | ICD-10-CM | POA: Diagnosis not present

## 2021-08-05 DIAGNOSIS — N183 Chronic kidney disease, stage 3 unspecified: Secondary | ICD-10-CM | POA: Diagnosis not present

## 2021-08-05 DIAGNOSIS — E039 Hypothyroidism, unspecified: Secondary | ICD-10-CM | POA: Diagnosis not present

## 2021-08-05 DIAGNOSIS — Z794 Long term (current) use of insulin: Secondary | ICD-10-CM | POA: Diagnosis not present

## 2021-08-05 DIAGNOSIS — B952 Enterococcus as the cause of diseases classified elsewhere: Secondary | ICD-10-CM | POA: Diagnosis not present

## 2021-08-05 DIAGNOSIS — N179 Acute kidney failure, unspecified: Secondary | ICD-10-CM | POA: Diagnosis not present

## 2021-08-05 DIAGNOSIS — R0600 Dyspnea, unspecified: Secondary | ICD-10-CM | POA: Diagnosis not present

## 2021-08-05 DIAGNOSIS — E2740 Unspecified adrenocortical insufficiency: Secondary | ICD-10-CM | POA: Diagnosis not present

## 2021-08-05 DIAGNOSIS — J9601 Acute respiratory failure with hypoxia: Secondary | ICD-10-CM | POA: Diagnosis not present

## 2021-08-06 DIAGNOSIS — R7881 Bacteremia: Secondary | ICD-10-CM | POA: Diagnosis not present

## 2021-08-06 DIAGNOSIS — E039 Hypothyroidism, unspecified: Secondary | ICD-10-CM | POA: Diagnosis not present

## 2021-08-06 DIAGNOSIS — Z794 Long term (current) use of insulin: Secondary | ICD-10-CM | POA: Diagnosis not present

## 2021-08-06 DIAGNOSIS — E108 Type 1 diabetes mellitus with unspecified complications: Secondary | ICD-10-CM | POA: Diagnosis not present

## 2021-08-06 DIAGNOSIS — J9601 Acute respiratory failure with hypoxia: Secondary | ICD-10-CM | POA: Diagnosis not present

## 2021-08-06 DIAGNOSIS — N179 Acute kidney failure, unspecified: Secondary | ICD-10-CM | POA: Diagnosis not present

## 2021-08-06 DIAGNOSIS — E2740 Unspecified adrenocortical insufficiency: Secondary | ICD-10-CM | POA: Diagnosis not present

## 2021-08-06 DIAGNOSIS — R0600 Dyspnea, unspecified: Secondary | ICD-10-CM | POA: Diagnosis not present

## 2021-08-06 DIAGNOSIS — D638 Anemia in other chronic diseases classified elsewhere: Secondary | ICD-10-CM | POA: Diagnosis not present

## 2021-08-06 DIAGNOSIS — B952 Enterococcus as the cause of diseases classified elsewhere: Secondary | ICD-10-CM | POA: Diagnosis not present

## 2021-08-06 DIAGNOSIS — J90 Pleural effusion, not elsewhere classified: Secondary | ICD-10-CM | POA: Diagnosis not present

## 2021-08-07 DIAGNOSIS — E2740 Unspecified adrenocortical insufficiency: Secondary | ICD-10-CM | POA: Diagnosis not present

## 2021-08-07 DIAGNOSIS — N179 Acute kidney failure, unspecified: Secondary | ICD-10-CM | POA: Diagnosis not present

## 2021-08-07 DIAGNOSIS — D638 Anemia in other chronic diseases classified elsewhere: Secondary | ICD-10-CM | POA: Diagnosis not present

## 2021-08-07 DIAGNOSIS — J9601 Acute respiratory failure with hypoxia: Secondary | ICD-10-CM | POA: Diagnosis not present

## 2021-08-08 DIAGNOSIS — J123 Human metapneumovirus pneumonia: Secondary | ICD-10-CM | POA: Diagnosis not present

## 2021-08-08 DIAGNOSIS — D649 Anemia, unspecified: Secondary | ICD-10-CM | POA: Diagnosis not present

## 2021-08-08 DIAGNOSIS — B952 Enterococcus as the cause of diseases classified elsewhere: Secondary | ICD-10-CM | POA: Diagnosis not present

## 2021-08-08 DIAGNOSIS — N179 Acute kidney failure, unspecified: Secondary | ICD-10-CM | POA: Diagnosis not present

## 2021-08-08 DIAGNOSIS — I130 Hypertensive heart and chronic kidney disease with heart failure and stage 1 through stage 4 chronic kidney disease, or unspecified chronic kidney disease: Secondary | ICD-10-CM | POA: Diagnosis not present

## 2021-08-08 DIAGNOSIS — J9601 Acute respiratory failure with hypoxia: Secondary | ICD-10-CM | POA: Diagnosis not present

## 2021-08-08 DIAGNOSIS — E2740 Unspecified adrenocortical insufficiency: Secondary | ICD-10-CM | POA: Diagnosis not present

## 2021-08-08 DIAGNOSIS — E039 Hypothyroidism, unspecified: Secondary | ICD-10-CM | POA: Diagnosis not present

## 2021-08-08 DIAGNOSIS — E108 Type 1 diabetes mellitus with unspecified complications: Secondary | ICD-10-CM | POA: Diagnosis not present

## 2021-08-08 DIAGNOSIS — N170 Acute kidney failure with tubular necrosis: Secondary | ICD-10-CM | POA: Diagnosis not present

## 2021-08-08 DIAGNOSIS — I509 Heart failure, unspecified: Secondary | ICD-10-CM | POA: Diagnosis not present

## 2021-08-08 DIAGNOSIS — J9610 Chronic respiratory failure, unspecified whether with hypoxia or hypercapnia: Secondary | ICD-10-CM | POA: Diagnosis not present

## 2021-08-08 DIAGNOSIS — N183 Chronic kidney disease, stage 3 unspecified: Secondary | ICD-10-CM | POA: Diagnosis not present

## 2021-08-08 DIAGNOSIS — D638 Anemia in other chronic diseases classified elsewhere: Secondary | ICD-10-CM | POA: Diagnosis not present

## 2021-08-08 DIAGNOSIS — R7881 Bacteremia: Secondary | ICD-10-CM | POA: Diagnosis not present

## 2021-08-09 DIAGNOSIS — I509 Heart failure, unspecified: Secondary | ICD-10-CM | POA: Diagnosis not present

## 2021-08-09 DIAGNOSIS — I130 Hypertensive heart and chronic kidney disease with heart failure and stage 1 through stage 4 chronic kidney disease, or unspecified chronic kidney disease: Secondary | ICD-10-CM | POA: Diagnosis not present

## 2021-08-09 DIAGNOSIS — J9610 Chronic respiratory failure, unspecified whether with hypoxia or hypercapnia: Secondary | ICD-10-CM | POA: Diagnosis not present

## 2021-08-09 DIAGNOSIS — D638 Anemia in other chronic diseases classified elsewhere: Secondary | ICD-10-CM | POA: Diagnosis not present

## 2021-08-09 DIAGNOSIS — J123 Human metapneumovirus pneumonia: Secondary | ICD-10-CM | POA: Diagnosis not present

## 2021-08-09 DIAGNOSIS — B952 Enterococcus as the cause of diseases classified elsewhere: Secondary | ICD-10-CM | POA: Diagnosis not present

## 2021-08-09 DIAGNOSIS — E2740 Unspecified adrenocortical insufficiency: Secondary | ICD-10-CM | POA: Diagnosis not present

## 2021-08-09 DIAGNOSIS — R7881 Bacteremia: Secondary | ICD-10-CM | POA: Diagnosis not present

## 2021-08-09 DIAGNOSIS — N170 Acute kidney failure with tubular necrosis: Secondary | ICD-10-CM | POA: Diagnosis not present

## 2021-08-09 DIAGNOSIS — J9601 Acute respiratory failure with hypoxia: Secondary | ICD-10-CM | POA: Diagnosis not present

## 2021-08-09 DIAGNOSIS — N179 Acute kidney failure, unspecified: Secondary | ICD-10-CM | POA: Diagnosis not present

## 2021-08-09 DIAGNOSIS — N183 Chronic kidney disease, stage 3 unspecified: Secondary | ICD-10-CM | POA: Diagnosis not present

## 2021-08-09 DIAGNOSIS — D649 Anemia, unspecified: Secondary | ICD-10-CM | POA: Diagnosis not present

## 2021-08-09 DIAGNOSIS — R1312 Dysphagia, oropharyngeal phase: Secondary | ICD-10-CM | POA: Diagnosis not present

## 2021-08-10 DIAGNOSIS — Z4901 Encounter for fitting and adjustment of extracorporeal dialysis catheter: Secondary | ICD-10-CM | POA: Diagnosis not present

## 2021-08-10 DIAGNOSIS — J90 Pleural effusion, not elsewhere classified: Secondary | ICD-10-CM | POA: Diagnosis not present

## 2021-08-10 DIAGNOSIS — D649 Anemia, unspecified: Secondary | ICD-10-CM | POA: Diagnosis not present

## 2021-08-10 DIAGNOSIS — E039 Hypothyroidism, unspecified: Secondary | ICD-10-CM | POA: Diagnosis not present

## 2021-08-10 DIAGNOSIS — R0600 Dyspnea, unspecified: Secondary | ICD-10-CM | POA: Diagnosis not present

## 2021-08-10 DIAGNOSIS — E1010 Type 1 diabetes mellitus with ketoacidosis without coma: Secondary | ICD-10-CM | POA: Diagnosis not present

## 2021-08-10 DIAGNOSIS — R1312 Dysphagia, oropharyngeal phase: Secondary | ICD-10-CM | POA: Diagnosis not present

## 2021-08-10 DIAGNOSIS — R918 Other nonspecific abnormal finding of lung field: Secondary | ICD-10-CM | POA: Diagnosis not present

## 2021-08-10 DIAGNOSIS — N179 Acute kidney failure, unspecified: Secondary | ICD-10-CM | POA: Diagnosis not present

## 2021-08-10 DIAGNOSIS — E1042 Type 1 diabetes mellitus with diabetic polyneuropathy: Secondary | ICD-10-CM | POA: Diagnosis not present

## 2021-08-10 DIAGNOSIS — R079 Chest pain, unspecified: Secondary | ICD-10-CM | POA: Diagnosis not present

## 2021-08-10 DIAGNOSIS — I509 Heart failure, unspecified: Secondary | ICD-10-CM | POA: Diagnosis not present

## 2021-08-10 DIAGNOSIS — J9601 Acute respiratory failure with hypoxia: Secondary | ICD-10-CM | POA: Diagnosis not present

## 2021-08-10 DIAGNOSIS — N183 Chronic kidney disease, stage 3 unspecified: Secondary | ICD-10-CM | POA: Diagnosis not present

## 2021-08-10 DIAGNOSIS — I130 Hypertensive heart and chronic kidney disease with heart failure and stage 1 through stage 4 chronic kidney disease, or unspecified chronic kidney disease: Secondary | ICD-10-CM | POA: Diagnosis not present

## 2021-08-10 DIAGNOSIS — E2740 Unspecified adrenocortical insufficiency: Secondary | ICD-10-CM | POA: Diagnosis not present

## 2021-08-10 DIAGNOSIS — E875 Hyperkalemia: Secondary | ICD-10-CM | POA: Diagnosis not present

## 2021-08-10 DIAGNOSIS — Z86718 Personal history of other venous thrombosis and embolism: Secondary | ICD-10-CM | POA: Diagnosis not present

## 2021-08-10 DIAGNOSIS — D638 Anemia in other chronic diseases classified elsewhere: Secondary | ICD-10-CM | POA: Diagnosis not present

## 2021-08-10 DIAGNOSIS — R7881 Bacteremia: Secondary | ICD-10-CM | POA: Diagnosis not present

## 2021-08-10 DIAGNOSIS — N170 Acute kidney failure with tubular necrosis: Secondary | ICD-10-CM | POA: Diagnosis not present

## 2021-08-11 DIAGNOSIS — N170 Acute kidney failure with tubular necrosis: Secondary | ICD-10-CM | POA: Diagnosis not present

## 2021-08-11 DIAGNOSIS — R1312 Dysphagia, oropharyngeal phase: Secondary | ICD-10-CM | POA: Diagnosis not present

## 2021-08-11 DIAGNOSIS — R7881 Bacteremia: Secondary | ICD-10-CM | POA: Diagnosis not present

## 2021-08-11 DIAGNOSIS — I509 Heart failure, unspecified: Secondary | ICD-10-CM | POA: Diagnosis not present

## 2021-08-11 DIAGNOSIS — E039 Hypothyroidism, unspecified: Secondary | ICD-10-CM | POA: Diagnosis not present

## 2021-08-11 DIAGNOSIS — N179 Acute kidney failure, unspecified: Secondary | ICD-10-CM | POA: Diagnosis not present

## 2021-08-11 DIAGNOSIS — B952 Enterococcus as the cause of diseases classified elsewhere: Secondary | ICD-10-CM | POA: Diagnosis not present

## 2021-08-11 DIAGNOSIS — I130 Hypertensive heart and chronic kidney disease with heart failure and stage 1 through stage 4 chronic kidney disease, or unspecified chronic kidney disease: Secondary | ICD-10-CM | POA: Diagnosis not present

## 2021-08-11 DIAGNOSIS — N183 Chronic kidney disease, stage 3 unspecified: Secondary | ICD-10-CM | POA: Diagnosis not present

## 2021-08-11 DIAGNOSIS — E1010 Type 1 diabetes mellitus with ketoacidosis without coma: Secondary | ICD-10-CM | POA: Diagnosis not present

## 2021-08-11 DIAGNOSIS — D649 Anemia, unspecified: Secondary | ICD-10-CM | POA: Diagnosis not present

## 2021-08-11 DIAGNOSIS — E2740 Unspecified adrenocortical insufficiency: Secondary | ICD-10-CM | POA: Diagnosis not present

## 2021-08-11 DIAGNOSIS — J9601 Acute respiratory failure with hypoxia: Secondary | ICD-10-CM | POA: Diagnosis not present

## 2021-08-12 DIAGNOSIS — D649 Anemia, unspecified: Secondary | ICD-10-CM | POA: Diagnosis not present

## 2021-08-12 DIAGNOSIS — E2740 Unspecified adrenocortical insufficiency: Secondary | ICD-10-CM | POA: Diagnosis not present

## 2021-08-12 DIAGNOSIS — I509 Heart failure, unspecified: Secondary | ICD-10-CM | POA: Diagnosis not present

## 2021-08-12 DIAGNOSIS — R1312 Dysphagia, oropharyngeal phase: Secondary | ICD-10-CM | POA: Diagnosis not present

## 2021-08-12 DIAGNOSIS — E108 Type 1 diabetes mellitus with unspecified complications: Secondary | ICD-10-CM | POA: Diagnosis not present

## 2021-08-12 DIAGNOSIS — I130 Hypertensive heart and chronic kidney disease with heart failure and stage 1 through stage 4 chronic kidney disease, or unspecified chronic kidney disease: Secondary | ICD-10-CM | POA: Diagnosis not present

## 2021-08-12 DIAGNOSIS — Z794 Long term (current) use of insulin: Secondary | ICD-10-CM | POA: Diagnosis not present

## 2021-08-12 DIAGNOSIS — B952 Enterococcus as the cause of diseases classified elsewhere: Secondary | ICD-10-CM | POA: Diagnosis not present

## 2021-08-12 DIAGNOSIS — N170 Acute kidney failure with tubular necrosis: Secondary | ICD-10-CM | POA: Diagnosis not present

## 2021-08-12 DIAGNOSIS — N183 Chronic kidney disease, stage 3 unspecified: Secondary | ICD-10-CM | POA: Diagnosis not present

## 2021-08-12 DIAGNOSIS — E039 Hypothyroidism, unspecified: Secondary | ICD-10-CM | POA: Diagnosis not present

## 2021-08-12 DIAGNOSIS — J9601 Acute respiratory failure with hypoxia: Secondary | ICD-10-CM | POA: Diagnosis not present

## 2021-08-12 DIAGNOSIS — N179 Acute kidney failure, unspecified: Secondary | ICD-10-CM | POA: Diagnosis not present

## 2021-08-12 DIAGNOSIS — R7881 Bacteremia: Secondary | ICD-10-CM | POA: Diagnosis not present

## 2021-08-13 DIAGNOSIS — J9601 Acute respiratory failure with hypoxia: Secondary | ICD-10-CM | POA: Diagnosis not present

## 2021-08-13 DIAGNOSIS — N179 Acute kidney failure, unspecified: Secondary | ICD-10-CM | POA: Diagnosis not present

## 2021-08-13 DIAGNOSIS — I509 Heart failure, unspecified: Secondary | ICD-10-CM | POA: Diagnosis not present

## 2021-08-13 DIAGNOSIS — D649 Anemia, unspecified: Secondary | ICD-10-CM | POA: Diagnosis not present

## 2021-08-13 DIAGNOSIS — N183 Chronic kidney disease, stage 3 unspecified: Secondary | ICD-10-CM | POA: Diagnosis not present

## 2021-08-13 DIAGNOSIS — I130 Hypertensive heart and chronic kidney disease with heart failure and stage 1 through stage 4 chronic kidney disease, or unspecified chronic kidney disease: Secondary | ICD-10-CM | POA: Diagnosis not present

## 2021-08-13 DIAGNOSIS — R1312 Dysphagia, oropharyngeal phase: Secondary | ICD-10-CM | POA: Diagnosis not present

## 2021-08-13 DIAGNOSIS — B952 Enterococcus as the cause of diseases classified elsewhere: Secondary | ICD-10-CM | POA: Diagnosis not present

## 2021-08-13 DIAGNOSIS — N170 Acute kidney failure with tubular necrosis: Secondary | ICD-10-CM | POA: Diagnosis not present

## 2021-08-13 DIAGNOSIS — R7881 Bacteremia: Secondary | ICD-10-CM | POA: Diagnosis not present

## 2021-08-14 DIAGNOSIS — N179 Acute kidney failure, unspecified: Secondary | ICD-10-CM | POA: Diagnosis not present

## 2021-08-14 DIAGNOSIS — Z794 Long term (current) use of insulin: Secondary | ICD-10-CM | POA: Diagnosis not present

## 2021-08-14 DIAGNOSIS — I509 Heart failure, unspecified: Secondary | ICD-10-CM | POA: Diagnosis not present

## 2021-08-14 DIAGNOSIS — R1312 Dysphagia, oropharyngeal phase: Secondary | ICD-10-CM | POA: Diagnosis not present

## 2021-08-14 DIAGNOSIS — E108 Type 1 diabetes mellitus with unspecified complications: Secondary | ICD-10-CM | POA: Diagnosis not present

## 2021-08-14 DIAGNOSIS — R7881 Bacteremia: Secondary | ICD-10-CM | POA: Diagnosis not present

## 2021-08-14 DIAGNOSIS — E2740 Unspecified adrenocortical insufficiency: Secondary | ICD-10-CM | POA: Diagnosis not present

## 2021-08-14 DIAGNOSIS — N183 Chronic kidney disease, stage 3 unspecified: Secondary | ICD-10-CM | POA: Diagnosis not present

## 2021-08-14 DIAGNOSIS — J9601 Acute respiratory failure with hypoxia: Secondary | ICD-10-CM | POA: Diagnosis not present

## 2021-08-14 DIAGNOSIS — E039 Hypothyroidism, unspecified: Secondary | ICD-10-CM | POA: Diagnosis not present

## 2021-08-14 DIAGNOSIS — D649 Anemia, unspecified: Secondary | ICD-10-CM | POA: Diagnosis not present

## 2021-08-14 DIAGNOSIS — N170 Acute kidney failure with tubular necrosis: Secondary | ICD-10-CM | POA: Diagnosis not present

## 2021-08-14 DIAGNOSIS — I130 Hypertensive heart and chronic kidney disease with heart failure and stage 1 through stage 4 chronic kidney disease, or unspecified chronic kidney disease: Secondary | ICD-10-CM | POA: Diagnosis not present

## 2021-08-14 DIAGNOSIS — B952 Enterococcus as the cause of diseases classified elsewhere: Secondary | ICD-10-CM | POA: Diagnosis not present

## 2021-08-15 DIAGNOSIS — N179 Acute kidney failure, unspecified: Secondary | ICD-10-CM | POA: Diagnosis not present

## 2021-08-15 DIAGNOSIS — I509 Heart failure, unspecified: Secondary | ICD-10-CM | POA: Diagnosis not present

## 2021-08-15 DIAGNOSIS — D649 Anemia, unspecified: Secondary | ICD-10-CM | POA: Diagnosis not present

## 2021-08-15 DIAGNOSIS — R1312 Dysphagia, oropharyngeal phase: Secondary | ICD-10-CM | POA: Diagnosis not present

## 2021-08-15 DIAGNOSIS — J9601 Acute respiratory failure with hypoxia: Secondary | ICD-10-CM | POA: Diagnosis not present

## 2021-08-15 DIAGNOSIS — B952 Enterococcus as the cause of diseases classified elsewhere: Secondary | ICD-10-CM | POA: Diagnosis not present

## 2021-08-15 DIAGNOSIS — N170 Acute kidney failure with tubular necrosis: Secondary | ICD-10-CM | POA: Diagnosis not present

## 2021-08-15 DIAGNOSIS — I130 Hypertensive heart and chronic kidney disease with heart failure and stage 1 through stage 4 chronic kidney disease, or unspecified chronic kidney disease: Secondary | ICD-10-CM | POA: Diagnosis not present

## 2021-08-15 DIAGNOSIS — N183 Chronic kidney disease, stage 3 unspecified: Secondary | ICD-10-CM | POA: Diagnosis not present

## 2021-08-15 DIAGNOSIS — R7881 Bacteremia: Secondary | ICD-10-CM | POA: Diagnosis not present

## 2021-08-16 DIAGNOSIS — I130 Hypertensive heart and chronic kidney disease with heart failure and stage 1 through stage 4 chronic kidney disease, or unspecified chronic kidney disease: Secondary | ICD-10-CM | POA: Diagnosis not present

## 2021-08-16 DIAGNOSIS — I509 Heart failure, unspecified: Secondary | ICD-10-CM | POA: Diagnosis not present

## 2021-08-16 DIAGNOSIS — B952 Enterococcus as the cause of diseases classified elsewhere: Secondary | ICD-10-CM | POA: Diagnosis not present

## 2021-08-16 DIAGNOSIS — R1312 Dysphagia, oropharyngeal phase: Secondary | ICD-10-CM | POA: Diagnosis not present

## 2021-08-16 DIAGNOSIS — D649 Anemia, unspecified: Secondary | ICD-10-CM | POA: Diagnosis not present

## 2021-08-16 DIAGNOSIS — N170 Acute kidney failure with tubular necrosis: Secondary | ICD-10-CM | POA: Diagnosis not present

## 2021-08-16 DIAGNOSIS — R7881 Bacteremia: Secondary | ICD-10-CM | POA: Diagnosis not present

## 2021-08-16 DIAGNOSIS — J9601 Acute respiratory failure with hypoxia: Secondary | ICD-10-CM | POA: Diagnosis not present

## 2021-08-16 DIAGNOSIS — N179 Acute kidney failure, unspecified: Secondary | ICD-10-CM | POA: Diagnosis not present

## 2021-08-16 DIAGNOSIS — N183 Chronic kidney disease, stage 3 unspecified: Secondary | ICD-10-CM | POA: Diagnosis not present

## 2021-08-17 DIAGNOSIS — R7881 Bacteremia: Secondary | ICD-10-CM | POA: Diagnosis not present

## 2021-08-17 DIAGNOSIS — N170 Acute kidney failure with tubular necrosis: Secondary | ICD-10-CM | POA: Diagnosis not present

## 2021-08-17 DIAGNOSIS — B952 Enterococcus as the cause of diseases classified elsewhere: Secondary | ICD-10-CM | POA: Diagnosis not present

## 2021-08-17 DIAGNOSIS — N183 Chronic kidney disease, stage 3 unspecified: Secondary | ICD-10-CM | POA: Diagnosis not present

## 2021-08-17 DIAGNOSIS — I130 Hypertensive heart and chronic kidney disease with heart failure and stage 1 through stage 4 chronic kidney disease, or unspecified chronic kidney disease: Secondary | ICD-10-CM | POA: Diagnosis not present

## 2021-08-17 DIAGNOSIS — R1312 Dysphagia, oropharyngeal phase: Secondary | ICD-10-CM | POA: Diagnosis not present

## 2021-08-17 DIAGNOSIS — J9601 Acute respiratory failure with hypoxia: Secondary | ICD-10-CM | POA: Diagnosis not present

## 2021-08-17 DIAGNOSIS — N179 Acute kidney failure, unspecified: Secondary | ICD-10-CM | POA: Diagnosis not present

## 2021-08-17 DIAGNOSIS — D649 Anemia, unspecified: Secondary | ICD-10-CM | POA: Diagnosis not present

## 2021-08-17 DIAGNOSIS — I509 Heart failure, unspecified: Secondary | ICD-10-CM | POA: Diagnosis not present

## 2021-08-18 DIAGNOSIS — F419 Anxiety disorder, unspecified: Secondary | ICD-10-CM | POA: Diagnosis not present

## 2021-08-18 DIAGNOSIS — F32A Depression, unspecified: Secondary | ICD-10-CM | POA: Diagnosis not present

## 2021-08-18 DIAGNOSIS — E1040 Type 1 diabetes mellitus with diabetic neuropathy, unspecified: Secondary | ICD-10-CM | POA: Diagnosis not present

## 2021-08-18 DIAGNOSIS — I5022 Chronic systolic (congestive) heart failure: Secondary | ICD-10-CM | POA: Diagnosis not present

## 2021-08-18 DIAGNOSIS — I428 Other cardiomyopathies: Secondary | ICD-10-CM | POA: Diagnosis not present

## 2021-08-18 DIAGNOSIS — A0472 Enterocolitis due to Clostridium difficile, not specified as recurrent: Secondary | ICD-10-CM | POA: Diagnosis not present

## 2021-08-18 DIAGNOSIS — E039 Hypothyroidism, unspecified: Secondary | ICD-10-CM | POA: Diagnosis not present

## 2021-08-18 DIAGNOSIS — E1022 Type 1 diabetes mellitus with diabetic chronic kidney disease: Secondary | ICD-10-CM | POA: Diagnosis not present

## 2021-08-18 DIAGNOSIS — N189 Chronic kidney disease, unspecified: Secondary | ICD-10-CM | POA: Diagnosis not present

## 2021-08-18 DIAGNOSIS — E1021 Type 1 diabetes mellitus with diabetic nephropathy: Secondary | ICD-10-CM | POA: Diagnosis not present

## 2021-08-18 DIAGNOSIS — Z8674 Personal history of sudden cardiac arrest: Secondary | ICD-10-CM | POA: Diagnosis not present

## 2021-08-18 DIAGNOSIS — E1043 Type 1 diabetes mellitus with diabetic autonomic (poly)neuropathy: Secondary | ICD-10-CM | POA: Diagnosis not present

## 2021-08-18 DIAGNOSIS — I10 Essential (primary) hypertension: Secondary | ICD-10-CM | POA: Diagnosis not present

## 2021-08-19 DIAGNOSIS — E1040 Type 1 diabetes mellitus with diabetic neuropathy, unspecified: Secondary | ICD-10-CM | POA: Diagnosis not present

## 2021-08-19 DIAGNOSIS — F32A Depression, unspecified: Secondary | ICD-10-CM | POA: Diagnosis not present

## 2021-08-19 DIAGNOSIS — F419 Anxiety disorder, unspecified: Secondary | ICD-10-CM | POA: Diagnosis not present

## 2021-08-19 DIAGNOSIS — N189 Chronic kidney disease, unspecified: Secondary | ICD-10-CM | POA: Diagnosis not present

## 2021-08-19 DIAGNOSIS — A0472 Enterocolitis due to Clostridium difficile, not specified as recurrent: Secondary | ICD-10-CM | POA: Diagnosis not present

## 2021-08-19 DIAGNOSIS — E1021 Type 1 diabetes mellitus with diabetic nephropathy: Secondary | ICD-10-CM | POA: Diagnosis not present

## 2021-08-19 DIAGNOSIS — I5022 Chronic systolic (congestive) heart failure: Secondary | ICD-10-CM | POA: Diagnosis not present

## 2021-08-19 DIAGNOSIS — E039 Hypothyroidism, unspecified: Secondary | ICD-10-CM | POA: Diagnosis not present

## 2021-08-19 DIAGNOSIS — I428 Other cardiomyopathies: Secondary | ICD-10-CM | POA: Diagnosis not present

## 2021-08-19 DIAGNOSIS — E1043 Type 1 diabetes mellitus with diabetic autonomic (poly)neuropathy: Secondary | ICD-10-CM | POA: Diagnosis not present

## 2021-08-19 DIAGNOSIS — E1022 Type 1 diabetes mellitus with diabetic chronic kidney disease: Secondary | ICD-10-CM | POA: Diagnosis not present

## 2021-08-19 DIAGNOSIS — I10 Essential (primary) hypertension: Secondary | ICD-10-CM | POA: Diagnosis not present

## 2021-08-19 DIAGNOSIS — Z8674 Personal history of sudden cardiac arrest: Secondary | ICD-10-CM | POA: Diagnosis not present

## 2021-08-21 DIAGNOSIS — I428 Other cardiomyopathies: Secondary | ICD-10-CM | POA: Diagnosis not present

## 2021-08-21 DIAGNOSIS — E039 Hypothyroidism, unspecified: Secondary | ICD-10-CM | POA: Diagnosis not present

## 2021-08-21 DIAGNOSIS — F32A Depression, unspecified: Secondary | ICD-10-CM | POA: Diagnosis not present

## 2021-08-21 DIAGNOSIS — Z8674 Personal history of sudden cardiac arrest: Secondary | ICD-10-CM | POA: Diagnosis not present

## 2021-08-21 DIAGNOSIS — E1021 Type 1 diabetes mellitus with diabetic nephropathy: Secondary | ICD-10-CM | POA: Diagnosis not present

## 2021-08-21 DIAGNOSIS — A0472 Enterocolitis due to Clostridium difficile, not specified as recurrent: Secondary | ICD-10-CM | POA: Diagnosis not present

## 2021-08-21 DIAGNOSIS — I5022 Chronic systolic (congestive) heart failure: Secondary | ICD-10-CM | POA: Diagnosis not present

## 2021-08-21 DIAGNOSIS — E1043 Type 1 diabetes mellitus with diabetic autonomic (poly)neuropathy: Secondary | ICD-10-CM | POA: Diagnosis not present

## 2021-08-21 DIAGNOSIS — F419 Anxiety disorder, unspecified: Secondary | ICD-10-CM | POA: Diagnosis not present

## 2021-08-21 DIAGNOSIS — I10 Essential (primary) hypertension: Secondary | ICD-10-CM | POA: Diagnosis not present

## 2021-08-21 DIAGNOSIS — N189 Chronic kidney disease, unspecified: Secondary | ICD-10-CM | POA: Diagnosis not present

## 2021-08-21 DIAGNOSIS — E1022 Type 1 diabetes mellitus with diabetic chronic kidney disease: Secondary | ICD-10-CM | POA: Diagnosis not present

## 2021-08-21 DIAGNOSIS — E1040 Type 1 diabetes mellitus with diabetic neuropathy, unspecified: Secondary | ICD-10-CM | POA: Diagnosis not present

## 2021-08-22 DIAGNOSIS — N289 Disorder of kidney and ureter, unspecified: Secondary | ICD-10-CM | POA: Diagnosis not present

## 2021-08-22 DIAGNOSIS — R404 Transient alteration of awareness: Secondary | ICD-10-CM | POA: Diagnosis not present

## 2021-08-22 DIAGNOSIS — Z419 Encounter for procedure for purposes other than remedying health state, unspecified: Secondary | ICD-10-CM | POA: Diagnosis not present

## 2021-08-22 DIAGNOSIS — Z794 Long term (current) use of insulin: Secondary | ICD-10-CM | POA: Diagnosis not present

## 2021-08-22 DIAGNOSIS — E271 Primary adrenocortical insufficiency: Secondary | ICD-10-CM | POA: Diagnosis not present

## 2021-08-22 DIAGNOSIS — R4020 Unspecified coma: Secondary | ICD-10-CM | POA: Diagnosis not present

## 2021-08-22 DIAGNOSIS — Z87891 Personal history of nicotine dependence: Secondary | ICD-10-CM | POA: Diagnosis not present

## 2021-08-22 DIAGNOSIS — E161 Other hypoglycemia: Secondary | ICD-10-CM | POA: Diagnosis not present

## 2021-08-22 DIAGNOSIS — R42 Dizziness and giddiness: Secondary | ICD-10-CM | POA: Diagnosis not present

## 2021-08-22 DIAGNOSIS — E162 Hypoglycemia, unspecified: Secondary | ICD-10-CM | POA: Diagnosis not present

## 2021-08-22 DIAGNOSIS — E10649 Type 1 diabetes mellitus with hypoglycemia without coma: Secondary | ICD-10-CM | POA: Diagnosis not present

## 2021-08-22 DIAGNOSIS — R531 Weakness: Secondary | ICD-10-CM | POA: Diagnosis not present

## 2021-08-22 DIAGNOSIS — I517 Cardiomegaly: Secondary | ICD-10-CM | POA: Diagnosis not present

## 2021-08-24 DIAGNOSIS — F419 Anxiety disorder, unspecified: Secondary | ICD-10-CM | POA: Diagnosis not present

## 2021-08-24 DIAGNOSIS — E1022 Type 1 diabetes mellitus with diabetic chronic kidney disease: Secondary | ICD-10-CM | POA: Diagnosis not present

## 2021-08-24 DIAGNOSIS — A0472 Enterocolitis due to Clostridium difficile, not specified as recurrent: Secondary | ICD-10-CM | POA: Diagnosis not present

## 2021-08-24 DIAGNOSIS — F32A Depression, unspecified: Secondary | ICD-10-CM | POA: Diagnosis not present

## 2021-08-24 DIAGNOSIS — I5022 Chronic systolic (congestive) heart failure: Secondary | ICD-10-CM | POA: Diagnosis not present

## 2021-08-24 DIAGNOSIS — E1040 Type 1 diabetes mellitus with diabetic neuropathy, unspecified: Secondary | ICD-10-CM | POA: Diagnosis not present

## 2021-08-24 DIAGNOSIS — N189 Chronic kidney disease, unspecified: Secondary | ICD-10-CM | POA: Diagnosis not present

## 2021-08-24 DIAGNOSIS — Z8674 Personal history of sudden cardiac arrest: Secondary | ICD-10-CM | POA: Diagnosis not present

## 2021-08-24 DIAGNOSIS — E039 Hypothyroidism, unspecified: Secondary | ICD-10-CM | POA: Diagnosis not present

## 2021-08-24 DIAGNOSIS — I10 Essential (primary) hypertension: Secondary | ICD-10-CM | POA: Diagnosis not present

## 2021-08-24 DIAGNOSIS — I428 Other cardiomyopathies: Secondary | ICD-10-CM | POA: Diagnosis not present

## 2021-08-24 DIAGNOSIS — E1043 Type 1 diabetes mellitus with diabetic autonomic (poly)neuropathy: Secondary | ICD-10-CM | POA: Diagnosis not present

## 2021-08-24 DIAGNOSIS — E1021 Type 1 diabetes mellitus with diabetic nephropathy: Secondary | ICD-10-CM | POA: Diagnosis not present

## 2021-08-27 DIAGNOSIS — I10 Essential (primary) hypertension: Secondary | ICD-10-CM | POA: Diagnosis not present

## 2021-08-27 DIAGNOSIS — Z7689 Persons encountering health services in other specified circumstances: Secondary | ICD-10-CM | POA: Diagnosis not present

## 2021-08-27 DIAGNOSIS — I428 Other cardiomyopathies: Secondary | ICD-10-CM | POA: Diagnosis not present

## 2021-08-27 DIAGNOSIS — I5020 Unspecified systolic (congestive) heart failure: Secondary | ICD-10-CM | POA: Diagnosis not present

## 2021-08-28 DIAGNOSIS — F32A Depression, unspecified: Secondary | ICD-10-CM | POA: Diagnosis not present

## 2021-08-28 DIAGNOSIS — E1040 Type 1 diabetes mellitus with diabetic neuropathy, unspecified: Secondary | ICD-10-CM | POA: Diagnosis not present

## 2021-08-28 DIAGNOSIS — E1021 Type 1 diabetes mellitus with diabetic nephropathy: Secondary | ICD-10-CM | POA: Diagnosis not present

## 2021-08-28 DIAGNOSIS — Z8674 Personal history of sudden cardiac arrest: Secondary | ICD-10-CM | POA: Diagnosis not present

## 2021-08-28 DIAGNOSIS — F419 Anxiety disorder, unspecified: Secondary | ICD-10-CM | POA: Diagnosis not present

## 2021-08-28 DIAGNOSIS — I5022 Chronic systolic (congestive) heart failure: Secondary | ICD-10-CM | POA: Diagnosis not present

## 2021-08-28 DIAGNOSIS — N189 Chronic kidney disease, unspecified: Secondary | ICD-10-CM | POA: Diagnosis not present

## 2021-08-28 DIAGNOSIS — E1043 Type 1 diabetes mellitus with diabetic autonomic (poly)neuropathy: Secondary | ICD-10-CM | POA: Diagnosis not present

## 2021-08-28 DIAGNOSIS — A0472 Enterocolitis due to Clostridium difficile, not specified as recurrent: Secondary | ICD-10-CM | POA: Diagnosis not present

## 2021-08-28 DIAGNOSIS — I10 Essential (primary) hypertension: Secondary | ICD-10-CM | POA: Diagnosis not present

## 2021-08-28 DIAGNOSIS — E039 Hypothyroidism, unspecified: Secondary | ICD-10-CM | POA: Diagnosis not present

## 2021-08-28 DIAGNOSIS — I428 Other cardiomyopathies: Secondary | ICD-10-CM | POA: Diagnosis not present

## 2021-08-28 DIAGNOSIS — E1022 Type 1 diabetes mellitus with diabetic chronic kidney disease: Secondary | ICD-10-CM | POA: Diagnosis not present

## 2021-08-31 DIAGNOSIS — E1043 Type 1 diabetes mellitus with diabetic autonomic (poly)neuropathy: Secondary | ICD-10-CM | POA: Diagnosis not present

## 2021-08-31 DIAGNOSIS — E1040 Type 1 diabetes mellitus with diabetic neuropathy, unspecified: Secondary | ICD-10-CM | POA: Diagnosis not present

## 2021-08-31 DIAGNOSIS — E2740 Unspecified adrenocortical insufficiency: Secondary | ICD-10-CM | POA: Insufficient documentation

## 2021-08-31 DIAGNOSIS — I10 Essential (primary) hypertension: Secondary | ICD-10-CM | POA: Diagnosis not present

## 2021-08-31 DIAGNOSIS — A0472 Enterocolitis due to Clostridium difficile, not specified as recurrent: Secondary | ICD-10-CM | POA: Diagnosis not present

## 2021-08-31 DIAGNOSIS — K2100 Gastro-esophageal reflux disease with esophagitis, without bleeding: Secondary | ICD-10-CM | POA: Insufficient documentation

## 2021-08-31 DIAGNOSIS — N189 Chronic kidney disease, unspecified: Secondary | ICD-10-CM | POA: Diagnosis not present

## 2021-08-31 DIAGNOSIS — I428 Other cardiomyopathies: Secondary | ICD-10-CM | POA: Diagnosis not present

## 2021-08-31 DIAGNOSIS — J123 Human metapneumovirus pneumonia: Secondary | ICD-10-CM | POA: Insufficient documentation

## 2021-08-31 DIAGNOSIS — Z8674 Personal history of sudden cardiac arrest: Secondary | ICD-10-CM | POA: Diagnosis not present

## 2021-08-31 DIAGNOSIS — F32A Depression, unspecified: Secondary | ICD-10-CM | POA: Diagnosis not present

## 2021-08-31 DIAGNOSIS — E039 Hypothyroidism, unspecified: Secondary | ICD-10-CM | POA: Diagnosis not present

## 2021-08-31 DIAGNOSIS — E1021 Type 1 diabetes mellitus with diabetic nephropathy: Secondary | ICD-10-CM | POA: Diagnosis not present

## 2021-08-31 DIAGNOSIS — F419 Anxiety disorder, unspecified: Secondary | ICD-10-CM | POA: Diagnosis not present

## 2021-08-31 DIAGNOSIS — I5022 Chronic systolic (congestive) heart failure: Secondary | ICD-10-CM | POA: Diagnosis not present

## 2021-08-31 DIAGNOSIS — E1022 Type 1 diabetes mellitus with diabetic chronic kidney disease: Secondary | ICD-10-CM | POA: Diagnosis not present

## 2021-08-31 DIAGNOSIS — I509 Heart failure, unspecified: Secondary | ICD-10-CM | POA: Insufficient documentation

## 2021-09-01 DIAGNOSIS — I5022 Chronic systolic (congestive) heart failure: Secondary | ICD-10-CM | POA: Diagnosis not present

## 2021-09-01 DIAGNOSIS — I5020 Unspecified systolic (congestive) heart failure: Secondary | ICD-10-CM | POA: Diagnosis not present

## 2021-09-01 DIAGNOSIS — E1021 Type 1 diabetes mellitus with diabetic nephropathy: Secondary | ICD-10-CM | POA: Diagnosis not present

## 2021-09-01 DIAGNOSIS — K2100 Gastro-esophageal reflux disease with esophagitis, without bleeding: Secondary | ICD-10-CM | POA: Diagnosis not present

## 2021-09-01 DIAGNOSIS — Z7689 Persons encountering health services in other specified circumstances: Secondary | ICD-10-CM | POA: Diagnosis not present

## 2021-09-01 DIAGNOSIS — E1042 Type 1 diabetes mellitus with diabetic polyneuropathy: Secondary | ICD-10-CM | POA: Diagnosis not present

## 2021-09-01 DIAGNOSIS — F411 Generalized anxiety disorder: Secondary | ICD-10-CM | POA: Insufficient documentation

## 2021-09-01 DIAGNOSIS — R7881 Bacteremia: Secondary | ICD-10-CM | POA: Diagnosis not present

## 2021-09-01 DIAGNOSIS — I428 Other cardiomyopathies: Secondary | ICD-10-CM | POA: Diagnosis not present

## 2021-09-01 DIAGNOSIS — E2740 Unspecified adrenocortical insufficiency: Secondary | ICD-10-CM | POA: Diagnosis not present

## 2021-09-01 DIAGNOSIS — Z87448 Personal history of other diseases of urinary system: Secondary | ICD-10-CM | POA: Diagnosis not present

## 2021-09-01 DIAGNOSIS — K3184 Gastroparesis: Secondary | ICD-10-CM | POA: Diagnosis not present

## 2021-09-01 DIAGNOSIS — O903 Peripartum cardiomyopathy: Secondary | ICD-10-CM | POA: Diagnosis not present

## 2021-09-01 DIAGNOSIS — B952 Enterococcus as the cause of diseases classified elsewhere: Secondary | ICD-10-CM | POA: Diagnosis not present

## 2021-09-01 DIAGNOSIS — Z8674 Personal history of sudden cardiac arrest: Secondary | ICD-10-CM | POA: Diagnosis not present

## 2021-09-02 DIAGNOSIS — E1042 Type 1 diabetes mellitus with diabetic polyneuropathy: Secondary | ICD-10-CM | POA: Diagnosis not present

## 2021-09-02 DIAGNOSIS — E039 Hypothyroidism, unspecified: Secondary | ICD-10-CM | POA: Diagnosis not present

## 2021-09-02 DIAGNOSIS — I428 Other cardiomyopathies: Secondary | ICD-10-CM | POA: Diagnosis not present

## 2021-09-02 DIAGNOSIS — E271 Primary adrenocortical insufficiency: Secondary | ICD-10-CM | POA: Diagnosis not present

## 2021-09-02 DIAGNOSIS — I5020 Unspecified systolic (congestive) heart failure: Secondary | ICD-10-CM | POA: Diagnosis not present

## 2021-09-03 DIAGNOSIS — F419 Anxiety disorder, unspecified: Secondary | ICD-10-CM | POA: Diagnosis not present

## 2021-09-03 DIAGNOSIS — Z8674 Personal history of sudden cardiac arrest: Secondary | ICD-10-CM | POA: Diagnosis not present

## 2021-09-03 DIAGNOSIS — I5022 Chronic systolic (congestive) heart failure: Secondary | ICD-10-CM | POA: Diagnosis not present

## 2021-09-03 DIAGNOSIS — I10 Essential (primary) hypertension: Secondary | ICD-10-CM | POA: Diagnosis not present

## 2021-09-03 DIAGNOSIS — F32A Depression, unspecified: Secondary | ICD-10-CM | POA: Diagnosis not present

## 2021-09-03 DIAGNOSIS — E1043 Type 1 diabetes mellitus with diabetic autonomic (poly)neuropathy: Secondary | ICD-10-CM | POA: Diagnosis not present

## 2021-09-03 DIAGNOSIS — E039 Hypothyroidism, unspecified: Secondary | ICD-10-CM | POA: Diagnosis not present

## 2021-09-03 DIAGNOSIS — E1040 Type 1 diabetes mellitus with diabetic neuropathy, unspecified: Secondary | ICD-10-CM | POA: Diagnosis not present

## 2021-09-03 DIAGNOSIS — N189 Chronic kidney disease, unspecified: Secondary | ICD-10-CM | POA: Diagnosis not present

## 2021-09-03 DIAGNOSIS — A0472 Enterocolitis due to Clostridium difficile, not specified as recurrent: Secondary | ICD-10-CM | POA: Diagnosis not present

## 2021-09-03 DIAGNOSIS — I428 Other cardiomyopathies: Secondary | ICD-10-CM | POA: Diagnosis not present

## 2021-09-03 DIAGNOSIS — E1021 Type 1 diabetes mellitus with diabetic nephropathy: Secondary | ICD-10-CM | POA: Diagnosis not present

## 2021-09-03 DIAGNOSIS — E1022 Type 1 diabetes mellitus with diabetic chronic kidney disease: Secondary | ICD-10-CM | POA: Diagnosis not present

## 2021-09-07 DIAGNOSIS — I5023 Acute on chronic systolic (congestive) heart failure: Secondary | ICD-10-CM | POA: Insufficient documentation

## 2021-09-07 DIAGNOSIS — F39 Unspecified mood [affective] disorder: Secondary | ICD-10-CM | POA: Diagnosis not present

## 2021-09-07 DIAGNOSIS — N184 Chronic kidney disease, stage 4 (severe): Secondary | ICD-10-CM | POA: Diagnosis not present

## 2021-09-07 DIAGNOSIS — I5020 Unspecified systolic (congestive) heart failure: Secondary | ICD-10-CM | POA: Diagnosis not present

## 2021-09-07 DIAGNOSIS — D696 Thrombocytopenia, unspecified: Secondary | ICD-10-CM | POA: Diagnosis not present

## 2021-09-07 DIAGNOSIS — R9431 Abnormal electrocardiogram [ECG] [EKG]: Secondary | ICD-10-CM | POA: Diagnosis not present

## 2021-09-07 DIAGNOSIS — E1043 Type 1 diabetes mellitus with diabetic autonomic (poly)neuropathy: Secondary | ICD-10-CM | POA: Diagnosis not present

## 2021-09-07 DIAGNOSIS — J90 Pleural effusion, not elsewhere classified: Secondary | ICD-10-CM | POA: Diagnosis not present

## 2021-09-07 DIAGNOSIS — D631 Anemia in chronic kidney disease: Secondary | ICD-10-CM | POA: Diagnosis not present

## 2021-09-07 DIAGNOSIS — E10319 Type 1 diabetes mellitus with unspecified diabetic retinopathy without macular edema: Secondary | ICD-10-CM | POA: Diagnosis not present

## 2021-09-07 DIAGNOSIS — E271 Primary adrenocortical insufficiency: Secondary | ICD-10-CM | POA: Diagnosis not present

## 2021-09-07 DIAGNOSIS — E873 Alkalosis: Secondary | ICD-10-CM | POA: Diagnosis not present

## 2021-09-07 DIAGNOSIS — E1042 Type 1 diabetes mellitus with diabetic polyneuropathy: Secondary | ICD-10-CM | POA: Diagnosis not present

## 2021-09-07 DIAGNOSIS — O24012 Pre-existing diabetes mellitus, type 1, in pregnancy, second trimester: Secondary | ICD-10-CM | POA: Diagnosis not present

## 2021-09-07 DIAGNOSIS — I428 Other cardiomyopathies: Secondary | ICD-10-CM | POA: Diagnosis not present

## 2021-09-07 DIAGNOSIS — E1065 Type 1 diabetes mellitus with hyperglycemia: Secondary | ICD-10-CM | POA: Diagnosis not present

## 2021-09-07 DIAGNOSIS — K3184 Gastroparesis: Secondary | ICD-10-CM | POA: Diagnosis not present

## 2021-09-07 DIAGNOSIS — E10649 Type 1 diabetes mellitus with hypoglycemia without coma: Secondary | ICD-10-CM | POA: Diagnosis not present

## 2021-09-07 DIAGNOSIS — E2740 Unspecified adrenocortical insufficiency: Secondary | ICD-10-CM | POA: Diagnosis not present

## 2021-09-07 DIAGNOSIS — R0902 Hypoxemia: Secondary | ICD-10-CM | POA: Diagnosis not present

## 2021-09-07 DIAGNOSIS — E1143 Type 2 diabetes mellitus with diabetic autonomic (poly)neuropathy: Secondary | ICD-10-CM | POA: Diagnosis not present

## 2021-09-07 DIAGNOSIS — I2109 ST elevation (STEMI) myocardial infarction involving other coronary artery of anterior wall: Secondary | ICD-10-CM | POA: Diagnosis not present

## 2021-09-07 DIAGNOSIS — E039 Hypothyroidism, unspecified: Secondary | ICD-10-CM | POA: Diagnosis not present

## 2021-09-08 DIAGNOSIS — R0600 Dyspnea, unspecified: Secondary | ICD-10-CM | POA: Diagnosis not present

## 2021-09-08 DIAGNOSIS — E876 Hypokalemia: Secondary | ICD-10-CM | POA: Diagnosis not present

## 2021-09-08 DIAGNOSIS — I5023 Acute on chronic systolic (congestive) heart failure: Secondary | ICD-10-CM | POA: Diagnosis not present

## 2021-09-08 DIAGNOSIS — E1021 Type 1 diabetes mellitus with diabetic nephropathy: Secondary | ICD-10-CM | POA: Diagnosis not present

## 2021-09-08 DIAGNOSIS — E039 Hypothyroidism, unspecified: Secondary | ICD-10-CM | POA: Diagnosis not present

## 2021-09-08 DIAGNOSIS — E2740 Unspecified adrenocortical insufficiency: Secondary | ICD-10-CM | POA: Diagnosis not present

## 2021-09-08 DIAGNOSIS — I130 Hypertensive heart and chronic kidney disease with heart failure and stage 1 through stage 4 chronic kidney disease, or unspecified chronic kidney disease: Secondary | ICD-10-CM | POA: Diagnosis not present

## 2021-09-08 DIAGNOSIS — N179 Acute kidney failure, unspecified: Secondary | ICD-10-CM | POA: Diagnosis not present

## 2021-09-08 DIAGNOSIS — E1042 Type 1 diabetes mellitus with diabetic polyneuropathy: Secondary | ICD-10-CM | POA: Diagnosis not present

## 2021-09-08 DIAGNOSIS — D649 Anemia, unspecified: Secondary | ICD-10-CM | POA: Diagnosis not present

## 2021-09-09 DIAGNOSIS — E1021 Type 1 diabetes mellitus with diabetic nephropathy: Secondary | ICD-10-CM | POA: Diagnosis not present

## 2021-09-09 DIAGNOSIS — E1042 Type 1 diabetes mellitus with diabetic polyneuropathy: Secondary | ICD-10-CM | POA: Diagnosis not present

## 2021-09-09 DIAGNOSIS — N179 Acute kidney failure, unspecified: Secondary | ICD-10-CM | POA: Diagnosis not present

## 2021-09-09 DIAGNOSIS — I130 Hypertensive heart and chronic kidney disease with heart failure and stage 1 through stage 4 chronic kidney disease, or unspecified chronic kidney disease: Secondary | ICD-10-CM | POA: Diagnosis not present

## 2021-09-09 DIAGNOSIS — R0600 Dyspnea, unspecified: Secondary | ICD-10-CM | POA: Diagnosis not present

## 2021-09-09 DIAGNOSIS — D649 Anemia, unspecified: Secondary | ICD-10-CM | POA: Diagnosis not present

## 2021-09-09 DIAGNOSIS — E876 Hypokalemia: Secondary | ICD-10-CM | POA: Diagnosis not present

## 2021-09-09 DIAGNOSIS — I5023 Acute on chronic systolic (congestive) heart failure: Secondary | ICD-10-CM | POA: Diagnosis not present

## 2021-09-10 DIAGNOSIS — E876 Hypokalemia: Secondary | ICD-10-CM | POA: Diagnosis not present

## 2021-09-10 DIAGNOSIS — E1042 Type 1 diabetes mellitus with diabetic polyneuropathy: Secondary | ICD-10-CM | POA: Diagnosis not present

## 2021-09-10 DIAGNOSIS — I5023 Acute on chronic systolic (congestive) heart failure: Secondary | ICD-10-CM | POA: Diagnosis not present

## 2021-09-10 DIAGNOSIS — I130 Hypertensive heart and chronic kidney disease with heart failure and stage 1 through stage 4 chronic kidney disease, or unspecified chronic kidney disease: Secondary | ICD-10-CM | POA: Diagnosis not present

## 2021-09-10 DIAGNOSIS — E1021 Type 1 diabetes mellitus with diabetic nephropathy: Secondary | ICD-10-CM | POA: Diagnosis not present

## 2021-09-10 DIAGNOSIS — D649 Anemia, unspecified: Secondary | ICD-10-CM | POA: Diagnosis not present

## 2021-09-10 DIAGNOSIS — R0600 Dyspnea, unspecified: Secondary | ICD-10-CM | POA: Diagnosis not present

## 2021-09-10 DIAGNOSIS — N179 Acute kidney failure, unspecified: Secondary | ICD-10-CM | POA: Diagnosis not present

## 2021-09-11 DIAGNOSIS — E1021 Type 1 diabetes mellitus with diabetic nephropathy: Secondary | ICD-10-CM | POA: Diagnosis not present

## 2021-09-11 DIAGNOSIS — N179 Acute kidney failure, unspecified: Secondary | ICD-10-CM | POA: Diagnosis not present

## 2021-09-11 DIAGNOSIS — I130 Hypertensive heart and chronic kidney disease with heart failure and stage 1 through stage 4 chronic kidney disease, or unspecified chronic kidney disease: Secondary | ICD-10-CM | POA: Diagnosis not present

## 2021-09-11 DIAGNOSIS — D649 Anemia, unspecified: Secondary | ICD-10-CM | POA: Diagnosis not present

## 2021-09-11 DIAGNOSIS — I5023 Acute on chronic systolic (congestive) heart failure: Secondary | ICD-10-CM | POA: Diagnosis not present

## 2021-09-11 DIAGNOSIS — E876 Hypokalemia: Secondary | ICD-10-CM | POA: Diagnosis not present

## 2021-09-11 DIAGNOSIS — E1042 Type 1 diabetes mellitus with diabetic polyneuropathy: Secondary | ICD-10-CM | POA: Diagnosis not present

## 2021-09-11 DIAGNOSIS — R0600 Dyspnea, unspecified: Secondary | ICD-10-CM | POA: Diagnosis not present

## 2021-09-12 DIAGNOSIS — D649 Anemia, unspecified: Secondary | ICD-10-CM | POA: Diagnosis not present

## 2021-09-12 DIAGNOSIS — I130 Hypertensive heart and chronic kidney disease with heart failure and stage 1 through stage 4 chronic kidney disease, or unspecified chronic kidney disease: Secondary | ICD-10-CM | POA: Diagnosis not present

## 2021-09-12 DIAGNOSIS — I5023 Acute on chronic systolic (congestive) heart failure: Secondary | ICD-10-CM | POA: Diagnosis not present

## 2021-09-12 DIAGNOSIS — E1021 Type 1 diabetes mellitus with diabetic nephropathy: Secondary | ICD-10-CM | POA: Diagnosis not present

## 2021-09-12 DIAGNOSIS — N179 Acute kidney failure, unspecified: Secondary | ICD-10-CM | POA: Diagnosis not present

## 2021-09-12 DIAGNOSIS — E1143 Type 2 diabetes mellitus with diabetic autonomic (poly)neuropathy: Secondary | ICD-10-CM | POA: Diagnosis not present

## 2021-09-12 DIAGNOSIS — E876 Hypokalemia: Secondary | ICD-10-CM | POA: Diagnosis not present

## 2021-09-12 DIAGNOSIS — R0600 Dyspnea, unspecified: Secondary | ICD-10-CM | POA: Diagnosis not present

## 2021-09-12 DIAGNOSIS — E2740 Unspecified adrenocortical insufficiency: Secondary | ICD-10-CM | POA: Diagnosis not present

## 2021-09-12 DIAGNOSIS — E1042 Type 1 diabetes mellitus with diabetic polyneuropathy: Secondary | ICD-10-CM | POA: Diagnosis not present

## 2021-09-12 DIAGNOSIS — K3184 Gastroparesis: Secondary | ICD-10-CM | POA: Diagnosis not present

## 2021-09-13 DIAGNOSIS — I5023 Acute on chronic systolic (congestive) heart failure: Secondary | ICD-10-CM | POA: Diagnosis not present

## 2021-09-13 DIAGNOSIS — D649 Anemia, unspecified: Secondary | ICD-10-CM | POA: Diagnosis not present

## 2021-09-13 DIAGNOSIS — E1042 Type 1 diabetes mellitus with diabetic polyneuropathy: Secondary | ICD-10-CM | POA: Diagnosis not present

## 2021-09-13 DIAGNOSIS — N179 Acute kidney failure, unspecified: Secondary | ICD-10-CM | POA: Diagnosis not present

## 2021-09-13 DIAGNOSIS — E1021 Type 1 diabetes mellitus with diabetic nephropathy: Secondary | ICD-10-CM | POA: Diagnosis not present

## 2021-09-13 DIAGNOSIS — E876 Hypokalemia: Secondary | ICD-10-CM | POA: Diagnosis not present

## 2021-09-13 DIAGNOSIS — I130 Hypertensive heart and chronic kidney disease with heart failure and stage 1 through stage 4 chronic kidney disease, or unspecified chronic kidney disease: Secondary | ICD-10-CM | POA: Diagnosis not present

## 2021-09-13 DIAGNOSIS — R0600 Dyspnea, unspecified: Secondary | ICD-10-CM | POA: Diagnosis not present

## 2021-09-13 DIAGNOSIS — E1143 Type 2 diabetes mellitus with diabetic autonomic (poly)neuropathy: Secondary | ICD-10-CM | POA: Diagnosis not present

## 2021-09-13 DIAGNOSIS — E2740 Unspecified adrenocortical insufficiency: Secondary | ICD-10-CM | POA: Diagnosis not present

## 2021-09-13 DIAGNOSIS — K3184 Gastroparesis: Secondary | ICD-10-CM | POA: Diagnosis not present

## 2021-09-14 DIAGNOSIS — E1042 Type 1 diabetes mellitus with diabetic polyneuropathy: Secondary | ICD-10-CM | POA: Diagnosis not present

## 2021-09-14 DIAGNOSIS — N179 Acute kidney failure, unspecified: Secondary | ICD-10-CM | POA: Diagnosis not present

## 2021-09-14 DIAGNOSIS — R0600 Dyspnea, unspecified: Secondary | ICD-10-CM | POA: Diagnosis not present

## 2021-09-14 DIAGNOSIS — E876 Hypokalemia: Secondary | ICD-10-CM | POA: Diagnosis not present

## 2021-09-14 DIAGNOSIS — I5023 Acute on chronic systolic (congestive) heart failure: Secondary | ICD-10-CM | POA: Diagnosis not present

## 2021-09-14 DIAGNOSIS — I130 Hypertensive heart and chronic kidney disease with heart failure and stage 1 through stage 4 chronic kidney disease, or unspecified chronic kidney disease: Secondary | ICD-10-CM | POA: Diagnosis not present

## 2021-09-14 DIAGNOSIS — D649 Anemia, unspecified: Secondary | ICD-10-CM | POA: Diagnosis not present

## 2021-09-14 DIAGNOSIS — E1021 Type 1 diabetes mellitus with diabetic nephropathy: Secondary | ICD-10-CM | POA: Diagnosis not present

## 2021-09-15 DIAGNOSIS — N179 Acute kidney failure, unspecified: Secondary | ICD-10-CM | POA: Diagnosis not present

## 2021-09-15 DIAGNOSIS — E1042 Type 1 diabetes mellitus with diabetic polyneuropathy: Secondary | ICD-10-CM | POA: Diagnosis not present

## 2021-09-15 DIAGNOSIS — D649 Anemia, unspecified: Secondary | ICD-10-CM | POA: Diagnosis not present

## 2021-09-15 DIAGNOSIS — E2740 Unspecified adrenocortical insufficiency: Secondary | ICD-10-CM | POA: Diagnosis not present

## 2021-09-15 DIAGNOSIS — I129 Hypertensive chronic kidney disease with stage 1 through stage 4 chronic kidney disease, or unspecified chronic kidney disease: Secondary | ICD-10-CM | POA: Diagnosis not present

## 2021-09-15 DIAGNOSIS — R0600 Dyspnea, unspecified: Secondary | ICD-10-CM | POA: Diagnosis not present

## 2021-09-15 DIAGNOSIS — I5023 Acute on chronic systolic (congestive) heart failure: Secondary | ICD-10-CM | POA: Diagnosis not present

## 2021-09-15 DIAGNOSIS — E1021 Type 1 diabetes mellitus with diabetic nephropathy: Secondary | ICD-10-CM | POA: Diagnosis not present

## 2021-09-15 DIAGNOSIS — Z7189 Other specified counseling: Secondary | ICD-10-CM | POA: Diagnosis not present

## 2021-09-15 DIAGNOSIS — E876 Hypokalemia: Secondary | ICD-10-CM | POA: Diagnosis not present

## 2021-09-15 DIAGNOSIS — N183 Chronic kidney disease, stage 3 unspecified: Secondary | ICD-10-CM | POA: Diagnosis not present

## 2021-09-16 DIAGNOSIS — E1021 Type 1 diabetes mellitus with diabetic nephropathy: Secondary | ICD-10-CM | POA: Diagnosis not present

## 2021-09-16 DIAGNOSIS — I129 Hypertensive chronic kidney disease with stage 1 through stage 4 chronic kidney disease, or unspecified chronic kidney disease: Secondary | ICD-10-CM | POA: Diagnosis not present

## 2021-09-16 DIAGNOSIS — D649 Anemia, unspecified: Secondary | ICD-10-CM | POA: Diagnosis not present

## 2021-09-16 DIAGNOSIS — N183 Chronic kidney disease, stage 3 unspecified: Secondary | ICD-10-CM | POA: Diagnosis not present

## 2021-09-16 DIAGNOSIS — I5023 Acute on chronic systolic (congestive) heart failure: Secondary | ICD-10-CM | POA: Diagnosis not present

## 2021-09-16 DIAGNOSIS — E1042 Type 1 diabetes mellitus with diabetic polyneuropathy: Secondary | ICD-10-CM | POA: Diagnosis not present

## 2021-09-16 DIAGNOSIS — Z7189 Other specified counseling: Secondary | ICD-10-CM | POA: Diagnosis not present

## 2021-09-16 DIAGNOSIS — E876 Hypokalemia: Secondary | ICD-10-CM | POA: Diagnosis not present

## 2021-09-16 DIAGNOSIS — N179 Acute kidney failure, unspecified: Secondary | ICD-10-CM | POA: Diagnosis not present

## 2021-09-16 DIAGNOSIS — R0600 Dyspnea, unspecified: Secondary | ICD-10-CM | POA: Diagnosis not present

## 2021-09-16 DIAGNOSIS — E2740 Unspecified adrenocortical insufficiency: Secondary | ICD-10-CM | POA: Diagnosis not present

## 2021-09-21 DIAGNOSIS — B348 Other viral infections of unspecified site: Secondary | ICD-10-CM | POA: Diagnosis not present

## 2021-09-21 DIAGNOSIS — E1022 Type 1 diabetes mellitus with diabetic chronic kidney disease: Secondary | ICD-10-CM | POA: Diagnosis not present

## 2021-09-21 DIAGNOSIS — E1042 Type 1 diabetes mellitus with diabetic polyneuropathy: Secondary | ICD-10-CM | POA: Diagnosis not present

## 2021-09-21 DIAGNOSIS — I5022 Chronic systolic (congestive) heart failure: Secondary | ICD-10-CM | POA: Diagnosis not present

## 2021-09-21 DIAGNOSIS — N184 Chronic kidney disease, stage 4 (severe): Secondary | ICD-10-CM | POA: Diagnosis not present

## 2021-09-21 DIAGNOSIS — E10649 Type 1 diabetes mellitus with hypoglycemia without coma: Secondary | ICD-10-CM | POA: Diagnosis not present

## 2021-09-21 DIAGNOSIS — E1021 Type 1 diabetes mellitus with diabetic nephropathy: Secondary | ICD-10-CM | POA: Diagnosis not present

## 2021-09-21 DIAGNOSIS — I425 Other restrictive cardiomyopathy: Secondary | ICD-10-CM | POA: Diagnosis not present

## 2021-09-21 DIAGNOSIS — N179 Acute kidney failure, unspecified: Secondary | ICD-10-CM | POA: Diagnosis not present

## 2021-09-21 DIAGNOSIS — A419 Sepsis, unspecified organism: Secondary | ICD-10-CM | POA: Diagnosis not present

## 2021-09-21 DIAGNOSIS — E2740 Unspecified adrenocortical insufficiency: Secondary | ICD-10-CM | POA: Diagnosis not present

## 2021-09-21 DIAGNOSIS — Z419 Encounter for procedure for purposes other than remedying health state, unspecified: Secondary | ICD-10-CM | POA: Diagnosis not present

## 2021-09-21 DIAGNOSIS — I5020 Unspecified systolic (congestive) heart failure: Secondary | ICD-10-CM | POA: Diagnosis not present

## 2021-09-21 DIAGNOSIS — D696 Thrombocytopenia, unspecified: Secondary | ICD-10-CM | POA: Diagnosis not present

## 2021-09-21 DIAGNOSIS — E869 Volume depletion, unspecified: Secondary | ICD-10-CM | POA: Diagnosis not present

## 2021-09-21 DIAGNOSIS — I5A Non-ischemic myocardial injury (non-traumatic): Secondary | ICD-10-CM | POA: Diagnosis not present

## 2021-09-21 DIAGNOSIS — F32A Depression, unspecified: Secondary | ICD-10-CM | POA: Diagnosis not present

## 2021-09-21 DIAGNOSIS — D631 Anemia in chronic kidney disease: Secondary | ICD-10-CM | POA: Diagnosis not present

## 2021-09-21 DIAGNOSIS — R109 Unspecified abdominal pain: Secondary | ICD-10-CM | POA: Diagnosis not present

## 2021-09-21 DIAGNOSIS — E162 Hypoglycemia, unspecified: Secondary | ICD-10-CM | POA: Diagnosis not present

## 2021-09-21 DIAGNOSIS — E872 Acidosis, unspecified: Secondary | ICD-10-CM | POA: Diagnosis not present

## 2021-09-21 DIAGNOSIS — R188 Other ascites: Secondary | ICD-10-CM | POA: Diagnosis not present

## 2021-09-21 DIAGNOSIS — D638 Anemia in other chronic diseases classified elsewhere: Secondary | ICD-10-CM | POA: Diagnosis not present

## 2021-09-21 DIAGNOSIS — E039 Hypothyroidism, unspecified: Secondary | ICD-10-CM | POA: Diagnosis not present

## 2021-09-22 DIAGNOSIS — R079 Chest pain, unspecified: Secondary | ICD-10-CM | POA: Diagnosis not present

## 2021-09-22 DIAGNOSIS — B348 Other viral infections of unspecified site: Secondary | ICD-10-CM | POA: Insufficient documentation

## 2021-09-22 DIAGNOSIS — D638 Anemia in other chronic diseases classified elsewhere: Secondary | ICD-10-CM | POA: Diagnosis not present

## 2021-09-22 DIAGNOSIS — Z794 Long term (current) use of insulin: Secondary | ICD-10-CM | POA: Diagnosis not present

## 2021-09-22 DIAGNOSIS — I5020 Unspecified systolic (congestive) heart failure: Secondary | ICD-10-CM | POA: Diagnosis not present

## 2021-09-22 DIAGNOSIS — E1021 Type 1 diabetes mellitus with diabetic nephropathy: Secondary | ICD-10-CM | POA: Diagnosis not present

## 2021-09-22 DIAGNOSIS — E2740 Unspecified adrenocortical insufficiency: Secondary | ICD-10-CM | POA: Diagnosis not present

## 2021-09-22 DIAGNOSIS — E039 Hypothyroidism, unspecified: Secondary | ICD-10-CM | POA: Diagnosis not present

## 2021-09-23 DIAGNOSIS — J90 Pleural effusion, not elsewhere classified: Secondary | ICD-10-CM | POA: Diagnosis not present

## 2021-09-23 DIAGNOSIS — D638 Anemia in other chronic diseases classified elsewhere: Secondary | ICD-10-CM | POA: Diagnosis not present

## 2021-09-23 DIAGNOSIS — E2740 Unspecified adrenocortical insufficiency: Secondary | ICD-10-CM | POA: Diagnosis not present

## 2021-09-23 DIAGNOSIS — J9811 Atelectasis: Secondary | ICD-10-CM | POA: Diagnosis not present

## 2021-09-23 DIAGNOSIS — E1021 Type 1 diabetes mellitus with diabetic nephropathy: Secondary | ICD-10-CM | POA: Diagnosis not present

## 2021-09-23 DIAGNOSIS — R079 Chest pain, unspecified: Secondary | ICD-10-CM | POA: Diagnosis not present

## 2021-09-23 DIAGNOSIS — I5020 Unspecified systolic (congestive) heart failure: Secondary | ICD-10-CM | POA: Diagnosis not present

## 2021-09-24 DIAGNOSIS — R079 Chest pain, unspecified: Secondary | ICD-10-CM | POA: Diagnosis not present

## 2021-09-24 DIAGNOSIS — Z609 Problem related to social environment, unspecified: Secondary | ICD-10-CM | POA: Insufficient documentation

## 2021-09-24 DIAGNOSIS — E2740 Unspecified adrenocortical insufficiency: Secondary | ICD-10-CM | POA: Diagnosis not present

## 2021-09-24 DIAGNOSIS — I5020 Unspecified systolic (congestive) heart failure: Secondary | ICD-10-CM | POA: Diagnosis not present

## 2021-09-24 DIAGNOSIS — D638 Anemia in other chronic diseases classified elsewhere: Secondary | ICD-10-CM | POA: Diagnosis not present

## 2021-09-24 DIAGNOSIS — E1021 Type 1 diabetes mellitus with diabetic nephropathy: Secondary | ICD-10-CM | POA: Diagnosis not present

## 2021-09-25 DIAGNOSIS — D638 Anemia in other chronic diseases classified elsewhere: Secondary | ICD-10-CM | POA: Diagnosis not present

## 2021-09-25 DIAGNOSIS — E2740 Unspecified adrenocortical insufficiency: Secondary | ICD-10-CM | POA: Diagnosis not present

## 2021-09-25 DIAGNOSIS — R079 Chest pain, unspecified: Secondary | ICD-10-CM | POA: Diagnosis not present

## 2021-09-25 DIAGNOSIS — I5020 Unspecified systolic (congestive) heart failure: Secondary | ICD-10-CM | POA: Diagnosis not present

## 2021-09-25 DIAGNOSIS — E1021 Type 1 diabetes mellitus with diabetic nephropathy: Secondary | ICD-10-CM | POA: Diagnosis not present

## 2021-09-26 DIAGNOSIS — F32A Depression, unspecified: Secondary | ICD-10-CM | POA: Diagnosis not present

## 2021-09-26 DIAGNOSIS — N189 Chronic kidney disease, unspecified: Secondary | ICD-10-CM | POA: Diagnosis not present

## 2021-09-26 DIAGNOSIS — E1021 Type 1 diabetes mellitus with diabetic nephropathy: Secondary | ICD-10-CM | POA: Diagnosis not present

## 2021-09-26 DIAGNOSIS — A0472 Enterocolitis due to Clostridium difficile, not specified as recurrent: Secondary | ICD-10-CM | POA: Diagnosis not present

## 2021-09-26 DIAGNOSIS — F419 Anxiety disorder, unspecified: Secondary | ICD-10-CM | POA: Diagnosis not present

## 2021-09-26 DIAGNOSIS — I10 Essential (primary) hypertension: Secondary | ICD-10-CM | POA: Diagnosis not present

## 2021-09-26 DIAGNOSIS — E1043 Type 1 diabetes mellitus with diabetic autonomic (poly)neuropathy: Secondary | ICD-10-CM | POA: Diagnosis not present

## 2021-09-26 DIAGNOSIS — E1040 Type 1 diabetes mellitus with diabetic neuropathy, unspecified: Secondary | ICD-10-CM | POA: Diagnosis not present

## 2021-09-26 DIAGNOSIS — Z8674 Personal history of sudden cardiac arrest: Secondary | ICD-10-CM | POA: Diagnosis not present

## 2021-09-26 DIAGNOSIS — I428 Other cardiomyopathies: Secondary | ICD-10-CM | POA: Diagnosis not present

## 2021-09-26 DIAGNOSIS — E1022 Type 1 diabetes mellitus with diabetic chronic kidney disease: Secondary | ICD-10-CM | POA: Diagnosis not present

## 2021-09-26 DIAGNOSIS — I5022 Chronic systolic (congestive) heart failure: Secondary | ICD-10-CM | POA: Diagnosis not present

## 2021-09-26 DIAGNOSIS — E039 Hypothyroidism, unspecified: Secondary | ICD-10-CM | POA: Diagnosis not present

## 2021-10-01 DIAGNOSIS — E1040 Type 1 diabetes mellitus with diabetic neuropathy, unspecified: Secondary | ICD-10-CM | POA: Diagnosis not present

## 2021-10-01 DIAGNOSIS — A0472 Enterocolitis due to Clostridium difficile, not specified as recurrent: Secondary | ICD-10-CM | POA: Diagnosis not present

## 2021-10-01 DIAGNOSIS — Z8674 Personal history of sudden cardiac arrest: Secondary | ICD-10-CM | POA: Diagnosis not present

## 2021-10-01 DIAGNOSIS — E1043 Type 1 diabetes mellitus with diabetic autonomic (poly)neuropathy: Secondary | ICD-10-CM | POA: Diagnosis not present

## 2021-10-01 DIAGNOSIS — I5022 Chronic systolic (congestive) heart failure: Secondary | ICD-10-CM | POA: Diagnosis not present

## 2021-10-01 DIAGNOSIS — F419 Anxiety disorder, unspecified: Secondary | ICD-10-CM | POA: Diagnosis not present

## 2021-10-01 DIAGNOSIS — N189 Chronic kidney disease, unspecified: Secondary | ICD-10-CM | POA: Diagnosis not present

## 2021-10-01 DIAGNOSIS — E1021 Type 1 diabetes mellitus with diabetic nephropathy: Secondary | ICD-10-CM | POA: Diagnosis not present

## 2021-10-01 DIAGNOSIS — E1022 Type 1 diabetes mellitus with diabetic chronic kidney disease: Secondary | ICD-10-CM | POA: Diagnosis not present

## 2021-10-01 DIAGNOSIS — E039 Hypothyroidism, unspecified: Secondary | ICD-10-CM | POA: Diagnosis not present

## 2021-10-01 DIAGNOSIS — I428 Other cardiomyopathies: Secondary | ICD-10-CM | POA: Diagnosis not present

## 2021-10-01 DIAGNOSIS — I10 Essential (primary) hypertension: Secondary | ICD-10-CM | POA: Diagnosis not present

## 2021-10-01 DIAGNOSIS — F32A Depression, unspecified: Secondary | ICD-10-CM | POA: Diagnosis not present

## 2021-10-02 DIAGNOSIS — E1022 Type 1 diabetes mellitus with diabetic chronic kidney disease: Secondary | ICD-10-CM | POA: Diagnosis not present

## 2021-10-02 DIAGNOSIS — D631 Anemia in chronic kidney disease: Secondary | ICD-10-CM | POA: Diagnosis not present

## 2021-10-02 DIAGNOSIS — R809 Proteinuria, unspecified: Secondary | ICD-10-CM | POA: Diagnosis not present

## 2021-10-02 DIAGNOSIS — N1832 Chronic kidney disease, stage 3b: Secondary | ICD-10-CM | POA: Diagnosis not present

## 2021-10-02 DIAGNOSIS — N179 Acute kidney failure, unspecified: Secondary | ICD-10-CM | POA: Diagnosis not present

## 2021-10-22 DIAGNOSIS — Z419 Encounter for procedure for purposes other than remedying health state, unspecified: Secondary | ICD-10-CM | POA: Diagnosis not present

## 2021-11-01 DIAGNOSIS — R4020 Unspecified coma: Secondary | ICD-10-CM | POA: Diagnosis not present

## 2021-11-01 DIAGNOSIS — R404 Transient alteration of awareness: Secondary | ICD-10-CM | POA: Diagnosis not present

## 2021-11-01 DIAGNOSIS — R000 Tachycardia, unspecified: Secondary | ICD-10-CM | POA: Diagnosis not present

## 2021-11-02 ENCOUNTER — Inpatient Hospital Stay
Admission: EM | Admit: 2021-11-02 | Discharge: 2021-11-06 | DRG: 637 | Disposition: A | Discharge status: Home / Self Care | Payer: Medicaid Other | Attending: Internal Medicine | Admitting: Internal Medicine

## 2021-11-02 ENCOUNTER — Other Ambulatory Visit: Payer: Self-pay

## 2021-11-02 ENCOUNTER — Emergency Department: Payer: Medicaid Other

## 2021-11-02 DIAGNOSIS — Z794 Long term (current) use of insulin: Secondary | ICD-10-CM | POA: Diagnosis not present

## 2021-11-02 DIAGNOSIS — Z79890 Hormone replacement therapy (postmenopausal): Secondary | ICD-10-CM | POA: Diagnosis not present

## 2021-11-02 DIAGNOSIS — D631 Anemia in chronic kidney disease: Secondary | ICD-10-CM | POA: Diagnosis not present

## 2021-11-02 DIAGNOSIS — N179 Acute kidney failure, unspecified: Secondary | ICD-10-CM | POA: Diagnosis not present

## 2021-11-02 DIAGNOSIS — R000 Tachycardia, unspecified: Secondary | ICD-10-CM | POA: Diagnosis not present

## 2021-11-02 DIAGNOSIS — E1043 Type 1 diabetes mellitus with diabetic autonomic (poly)neuropathy: Secondary | ICD-10-CM | POA: Diagnosis present

## 2021-11-02 DIAGNOSIS — D61818 Other pancytopenia: Secondary | ICD-10-CM | POA: Diagnosis not present

## 2021-11-02 DIAGNOSIS — E039 Hypothyroidism, unspecified: Secondary | ICD-10-CM | POA: Diagnosis not present

## 2021-11-02 DIAGNOSIS — K3184 Gastroparesis: Secondary | ICD-10-CM | POA: Diagnosis present

## 2021-11-02 DIAGNOSIS — I5022 Chronic systolic (congestive) heart failure: Secondary | ICD-10-CM | POA: Diagnosis not present

## 2021-11-02 DIAGNOSIS — E2740 Unspecified adrenocortical insufficiency: Secondary | ICD-10-CM | POA: Diagnosis not present

## 2021-11-02 DIAGNOSIS — E1069 Type 1 diabetes mellitus with other specified complication: Secondary | ICD-10-CM | POA: Diagnosis not present

## 2021-11-02 DIAGNOSIS — I428 Other cardiomyopathies: Secondary | ICD-10-CM | POA: Diagnosis not present

## 2021-11-02 DIAGNOSIS — Z79899 Other long term (current) drug therapy: Secondary | ICD-10-CM | POA: Diagnosis not present

## 2021-11-02 DIAGNOSIS — G9341 Metabolic encephalopathy: Secondary | ICD-10-CM | POA: Diagnosis present

## 2021-11-02 DIAGNOSIS — E162 Hypoglycemia, unspecified: Secondary | ICD-10-CM | POA: Diagnosis not present

## 2021-11-02 DIAGNOSIS — R9431 Abnormal electrocardiogram [ECG] [EKG]: Secondary | ICD-10-CM | POA: Diagnosis not present

## 2021-11-02 DIAGNOSIS — Z86718 Personal history of other venous thrombosis and embolism: Secondary | ICD-10-CM

## 2021-11-02 DIAGNOSIS — D696 Thrombocytopenia, unspecified: Secondary | ICD-10-CM

## 2021-11-02 DIAGNOSIS — F32A Depression, unspecified: Secondary | ICD-10-CM | POA: Diagnosis not present

## 2021-11-02 DIAGNOSIS — I130 Hypertensive heart and chronic kidney disease with heart failure and stage 1 through stage 4 chronic kidney disease, or unspecified chronic kidney disease: Secondary | ICD-10-CM | POA: Diagnosis not present

## 2021-11-02 DIAGNOSIS — E1022 Type 1 diabetes mellitus with diabetic chronic kidney disease: Secondary | ICD-10-CM | POA: Diagnosis not present

## 2021-11-02 DIAGNOSIS — Z833 Family history of diabetes mellitus: Secondary | ICD-10-CM

## 2021-11-02 DIAGNOSIS — Z8249 Family history of ischemic heart disease and other diseases of the circulatory system: Secondary | ICD-10-CM

## 2021-11-02 DIAGNOSIS — E875 Hyperkalemia: Secondary | ICD-10-CM | POA: Diagnosis not present

## 2021-11-02 DIAGNOSIS — N390 Urinary tract infection, site not specified: Secondary | ICD-10-CM | POA: Diagnosis present

## 2021-11-02 DIAGNOSIS — I5020 Unspecified systolic (congestive) heart failure: Secondary | ICD-10-CM | POA: Diagnosis present

## 2021-11-02 DIAGNOSIS — N1832 Chronic kidney disease, stage 3b: Secondary | ICD-10-CM

## 2021-11-02 DIAGNOSIS — E10649 Type 1 diabetes mellitus with hypoglycemia without coma: Principal | ICD-10-CM | POA: Diagnosis present

## 2021-11-02 DIAGNOSIS — E1010 Type 1 diabetes mellitus with ketoacidosis without coma: Secondary | ICD-10-CM

## 2021-11-02 DIAGNOSIS — E11649 Type 2 diabetes mellitus with hypoglycemia without coma: Secondary | ICD-10-CM | POA: Diagnosis not present

## 2021-11-02 LAB — URINALYSIS, ROUTINE W REFLEX MICROSCOPIC
Bilirubin Urine: NEGATIVE
Glucose, UA: >=500 mg/dL — AB
Ketones, ur: NEGATIVE mg/dL
Nitrite: NEGATIVE
Protein, ur: 30 mg/dL — AB
Specific Gravity, Urine: 1.006 (ref 1.005–1.030)
pH: 5 (ref 5.0–8.0)

## 2021-11-02 LAB — HEPATIC FUNCTION PANEL
ALT: 21 U/L (ref 0–44)
AST: 32 U/L (ref 15–41)
Albumin: 3.3 g/dL — ABNORMAL LOW (ref 3.5–5.0)
Alkaline Phosphatase: 69 U/L (ref 38–126)
Bilirubin, Direct: 0.1 mg/dL (ref 0.0–0.2)
Indirect Bilirubin: 0.5 mg/dL (ref 0.3–0.9)
Total Bilirubin: 0.6 mg/dL (ref 0.3–1.2)
Total Protein: 6.6 g/dL (ref 6.5–8.1)

## 2021-11-02 LAB — CBG MONITORING, ED
Glucose-Capillary: 132 mg/dL — ABNORMAL HIGH (ref 70–99)
Glucose-Capillary: 33 mg/dL — CL (ref 70–99)
Glucose-Capillary: 36 mg/dL — CL (ref 70–99)
Glucose-Capillary: 124 mg/dL — ABNORMAL HIGH (ref 70–99)
Glucose-Capillary: 68 mg/dL — ABNORMAL LOW (ref 70–99)
Glucose-Capillary: 175 mg/dL — ABNORMAL HIGH (ref 70–99)
Glucose-Capillary: 48 mg/dL — ABNORMAL LOW (ref 70–99)
Glucose-Capillary: 37 mg/dL — CL (ref 70–99)
Glucose-Capillary: 59 mg/dL — ABNORMAL LOW (ref 70–99)
Glucose-Capillary: 150 mg/dL — ABNORMAL HIGH (ref 70–99)
Glucose-Capillary: 70 mg/dL (ref 70–99)
Glucose-Capillary: 85 mg/dL (ref 70–99)
Glucose-Capillary: 119 mg/dL — ABNORMAL HIGH (ref 70–99)
Glucose-Capillary: 211 mg/dL — ABNORMAL HIGH (ref 70–99)
Glucose-Capillary: 40 mg/dL — CL (ref 70–99)
Glucose-Capillary: 77 mg/dL (ref 70–99)
Glucose-Capillary: 168 mg/dL — ABNORMAL HIGH (ref 70–99)
Glucose-Capillary: 168 mg/dL — ABNORMAL HIGH (ref 70–99)

## 2021-11-02 LAB — PHOSPHORUS
Phosphorus: <1 mg/dL — CL (ref 2.5–4.6)
Phosphorus: 5.6 mg/dL — ABNORMAL HIGH (ref 2.5–4.6)

## 2021-11-02 LAB — GLUCOSE, CAPILLARY
Glucose-Capillary: 120 mg/dL — ABNORMAL HIGH (ref 70–99)
Glucose-Capillary: 153 mg/dL — ABNORMAL HIGH (ref 70–99)
Glucose-Capillary: 202 mg/dL — ABNORMAL HIGH (ref 70–99)
Glucose-Capillary: 208 mg/dL — ABNORMAL HIGH (ref 70–99)
Glucose-Capillary: 248 mg/dL — ABNORMAL HIGH (ref 70–99)
Glucose-Capillary: 256 mg/dL — ABNORMAL HIGH (ref 70–99)
Glucose-Capillary: 342 mg/dL — ABNORMAL HIGH (ref 70–99)
Glucose-Capillary: 353 mg/dL — ABNORMAL HIGH (ref 70–99)
Glucose-Capillary: 394 mg/dL — ABNORMAL HIGH (ref 70–99)
Glucose-Capillary: 441 mg/dL — ABNORMAL HIGH (ref 70–99)
Glucose-Capillary: 78 mg/dL (ref 70–99)

## 2021-11-02 LAB — PROCALCITONIN: Procalcitonin: 0.85 ng/mL

## 2021-11-02 LAB — BASIC METABOLIC PANEL
Anion gap: 3 — ABNORMAL LOW (ref 5–15)
Anion gap: 7 (ref 5–15)
BUN: 36 mg/dL — ABNORMAL HIGH (ref 6–20)
BUN: 37 mg/dL — ABNORMAL HIGH (ref 6–20)
CO2: 23 mmol/L (ref 22–32)
CO2: 23 mmol/L (ref 22–32)
Calcium: 8.5 mg/dL — ABNORMAL LOW (ref 8.9–10.3)
Calcium: 9 mg/dL (ref 8.9–10.3)
Chloride: 107 mmol/L (ref 98–111)
Chloride: 114 mmol/L — ABNORMAL HIGH (ref 98–111)
Creatinine, Ser: 1.94 mg/dL — ABNORMAL HIGH (ref 0.44–1.00)
Creatinine, Ser: 1.94 mg/dL — ABNORMAL HIGH (ref 0.44–1.00)
GFR, Estimated: 35 mL/min — ABNORMAL LOW (ref >60)
GFR, Estimated: 35 mL/min — ABNORMAL LOW (ref >60)
Glucose, Bld: 133 mg/dL — ABNORMAL HIGH (ref 70–99)
Glucose, Bld: 40 mg/dL — CL (ref 70–99)
Potassium: 4 mmol/L (ref 3.5–5.1)
Potassium: 4.4 mmol/L (ref 3.5–5.1)
Sodium: 137 mmol/L (ref 135–145)
Sodium: 140 mmol/L (ref 135–145)

## 2021-11-02 LAB — PATHOLOGIST SMEAR REVIEW

## 2021-11-02 LAB — CBC WITH DIFFERENTIAL/PLATELET
Abs Immature Granulocytes: 0.01 10*3/uL (ref 0.00–0.07)
Basophils Absolute: 0 10*3/uL (ref 0.0–0.1)
Basophils Relative: 0 %
Eosinophils Absolute: 0.1 10*3/uL (ref 0.0–0.5)
Eosinophils Relative: 2 %
HCT: 30.3 % — ABNORMAL LOW (ref 36.0–46.0)
Hemoglobin: 9.6 g/dL — ABNORMAL LOW (ref 12.0–15.0)
Immature Granulocytes: 0 %
Lymphocytes Relative: 43 %
Lymphs Abs: 1.7 10*3/uL (ref 0.7–4.0)
MCH: 29.4 pg (ref 26.0–34.0)
MCHC: 31.7 g/dL (ref 30.0–36.0)
MCV: 92.9 fL (ref 80.0–100.0)
Monocytes Absolute: 0.2 10*3/uL (ref 0.1–1.0)
Monocytes Relative: 4 %
Neutro Abs: 2 10*3/uL (ref 1.7–7.7)
Neutrophils Relative %: 51 %
Platelets: 103 10*3/uL — ABNORMAL LOW (ref 150–400)
RBC: 3.26 MIL/uL — ABNORMAL LOW (ref 3.87–5.11)
RDW: 12.4 % (ref 11.5–15.5)
WBC: 3.9 10*3/uL — ABNORMAL LOW (ref 4.0–10.5)
nRBC: 0 % (ref 0.0–0.2)

## 2021-11-02 LAB — HEMOGLOBIN A1C
Hgb A1c MFr Bld: 8.4 % — ABNORMAL HIGH (ref 4.8–5.6)
Mean Plasma Glucose: 194.38 mg/dL

## 2021-11-02 LAB — BLOOD GAS, VENOUS
Acid-base deficit: 0.8 mmol/L (ref 0.0–2.0)
Bicarbonate: 24.3 mmol/L (ref 20.0–28.0)
O2 Saturation: 83.5 %
Patient temperature: 37
pCO2, Ven: 41 mmHg — ABNORMAL LOW (ref 44–60)
pH, Ven: 7.38 (ref 7.25–7.43)
pO2, Ven: 47 mmHg — ABNORMAL HIGH (ref 32–45)

## 2021-11-02 LAB — MRSA NEXT GEN BY PCR, NASAL: MRSA by PCR Next Gen: NOT DETECTED

## 2021-11-02 LAB — T4, FREE: Free T4: 0.59 ng/dL — ABNORMAL LOW (ref 0.61–1.12)

## 2021-11-02 LAB — MONONUCLEOSIS SCREEN: Mono Screen: NEGATIVE

## 2021-11-02 LAB — HIV ANTIBODY (ROUTINE TESTING W REFLEX): HIV Screen 4th Generation wRfx: NONREACTIVE

## 2021-11-02 LAB — TSH: TSH: 4.873 u[IU]/mL — ABNORMAL HIGH (ref 0.350–4.500)

## 2021-11-02 LAB — CORTISOL: Cortisol, Plasma: 5.2 ug/dL

## 2021-11-02 LAB — MAGNESIUM
Magnesium: 1.6 mg/dL — ABNORMAL LOW (ref 1.7–2.4)
Magnesium: 2.3 mg/dL (ref 1.7–2.4)

## 2021-11-02 LAB — HCG, QUANTITATIVE, PREGNANCY: hCG, Beta Chain, Quant, S: 1 m[IU]/mL (ref <5)

## 2021-11-02 MED ORDER — ACETAMINOPHEN 650 MG RE SUPP: 650.0000 mg | Freq: Four times a day (QID) | RECTAL | Status: DC | PRN

## 2021-11-02 MED ORDER — INSULIN ASPART 100 UNIT/ML IJ SOLN
0.0000 [IU] | Freq: Three times a day (TID) | INTRAMUSCULAR | Status: DC
  Administered 2021-11-02: 15 [IU] via SUBCUTANEOUS
  Filled 2021-11-02: qty 1

## 2021-11-02 MED ORDER — MAGNESIUM SULFATE 2 GM/50ML IV SOLN
2.0000 g | Freq: Once | INTRAVENOUS | Status: AC
  Administered 2021-11-02: 2 g via INTRAVENOUS
  Filled 2021-11-02: qty 50

## 2021-11-02 MED ORDER — SODIUM PHOSPHATES 45 MMOLE/15ML IV SOLN
30.0000 mmol | Freq: Once | INTRAVENOUS | Status: AC
  Administered 2021-11-02: 30 mmol via INTRAVENOUS
  Filled 2021-11-02: qty 10

## 2021-11-02 MED ORDER — LEVOTHYROXINE SODIUM 50 MCG PO TABS
25.0000 ug | ORAL_TABLET | Freq: Every day | ORAL | Status: DC
Start: 2021-11-02 — End: 2021-11-06
  Administered 2021-11-02 – 2021-11-06 (×5): 25 ug via ORAL
  Filled 2021-11-02 (×5): qty 1

## 2021-11-02 MED ORDER — MIRTAZAPINE 15 MG PO TABS
30.0000 mg | ORAL_TABLET | Freq: Every day | ORAL | Status: DC
  Administered 2021-11-02 – 2021-11-05 (×4): 30 mg via ORAL
  Filled 2021-11-02 (×4): qty 2

## 2021-11-02 MED ORDER — DEXTROSE 50 % IV SOLN: 1.0000 | INTRAVENOUS | Status: AC

## 2021-11-02 MED ORDER — CHLORHEXIDINE GLUCONATE CLOTH 2 % EX PADS
6.0000 | MEDICATED_PAD | Freq: Every day | CUTANEOUS | Status: DC
  Administered 2021-11-02 – 2021-11-04 (×3): 6 via TOPICAL

## 2021-11-02 MED ORDER — HYDROCORTISONE SOD SUC (PF) 100 MG IJ SOLR
100.0000 mg | Freq: Three times a day (TID) | INTRAMUSCULAR | Status: DC
  Filled 2021-11-02: qty 2

## 2021-11-02 MED ORDER — ENOXAPARIN SODIUM 40 MG/0.4ML IJ SOSY
40.0000 mg | PREFILLED_SYRINGE | INTRAMUSCULAR | Status: DC
  Filled 2021-11-02 (×3): qty 0.4

## 2021-11-02 MED ORDER — LACTATED RINGERS IV BOLUS
1000.0000 mL | Freq: Once | INTRAVENOUS | Status: AC
  Administered 2021-11-02: 1000 mL via INTRAVENOUS

## 2021-11-02 MED ORDER — INSULIN GLARGINE-YFGN 100 UNIT/ML ~~LOC~~ SOLN
4.0000 [IU] | Freq: Two times a day (BID) | SUBCUTANEOUS | Status: DC
  Administered 2021-11-02: 4 [IU] via SUBCUTANEOUS
  Filled 2021-11-02: qty 0.04

## 2021-11-02 MED ORDER — DEXTROSE 10 % IV SOLN: INTRAVENOUS | Status: DC

## 2021-11-02 MED ORDER — DEXTROSE 50 % IV SOLN
INTRAVENOUS | Status: AC
  Filled 2021-11-02: qty 50

## 2021-11-02 MED ORDER — DEXTROSE 50 % IV SOLN
0.0000 mL | INTRAVENOUS | Status: DC | PRN
  Administered 2021-11-03: 20 mL via INTRAVENOUS
  Filled 2021-11-02: qty 50

## 2021-11-02 MED ORDER — INSULIN ASPART 100 UNIT/ML IJ SOLN
10.0000 [IU] | Freq: Once | INTRAMUSCULAR | Status: AC
  Administered 2021-11-02: 10 [IU] via SUBCUTANEOUS
  Filled 2021-11-02: qty 1

## 2021-11-02 MED ORDER — ACETAMINOPHEN 325 MG PO TABS: 650.0000 mg | ORAL_TABLET | Freq: Four times a day (QID) | ORAL | Status: DC | PRN

## 2021-11-02 MED ORDER — INSULIN REGULAR(HUMAN) IN NACL 100-0.9 UT/100ML-% IV SOLN
INTRAVENOUS | Status: DC
  Administered 2021-11-02: 6.5 [IU]/h via INTRAVENOUS
  Filled 2021-11-02: qty 100

## 2021-11-02 MED ORDER — METHYLPREDNISOLONE SODIUM SUCC 40 MG IJ SOLR
40.0000 mg | Freq: Once | INTRAMUSCULAR | Status: AC
  Administered 2021-11-02: 40 mg via INTRAVENOUS
  Filled 2021-11-02: qty 1

## 2021-11-02 MED ORDER — DEXTROSE 50 % IV SOLN
INTRAVENOUS | Status: AC
  Administered 2021-11-02: 50 mL via INTRAVENOUS
  Filled 2021-11-02: qty 50

## 2021-11-02 MED ORDER — HYDROCORTISONE 5 MG PO TABS: 5.0000 mg | ORAL_TABLET | Freq: Every day | ORAL | Status: DC

## 2021-11-02 MED ORDER — METHYLPREDNISOLONE SODIUM SUCC 40 MG IJ SOLR
20.0000 mg | Freq: Two times a day (BID) | INTRAMUSCULAR | Status: DC
  Administered 2021-11-02 – 2021-11-04 (×5): 20 mg via INTRAVENOUS
  Filled 2021-11-02 (×5): qty 1

## 2021-11-02 MED ORDER — PANTOPRAZOLE SODIUM 40 MG PO TBEC
40.0000 mg | DELAYED_RELEASE_TABLET | Freq: Every day | ORAL | Status: DC
  Administered 2021-11-02 – 2021-11-06 (×5): 40 mg via ORAL
  Filled 2021-11-02 (×5): qty 1

## 2021-11-02 MED ORDER — DEXTROSE 50 % IV SOLN: 1.0000 | Freq: Once | INTRAVENOUS | Status: AC

## 2021-11-02 MED ORDER — DEXTROSE 50 % IV SOLN
1.0000 | Freq: Once | INTRAVENOUS | Status: AC
  Administered 2021-11-02: 50 mL via INTRAVENOUS

## 2021-11-02 MED ORDER — HYDROCORTISONE 10 MG PO TABS
10.0000 mg | ORAL_TABLET | ORAL | Status: DC
  Administered 2021-11-02: 10 mg via ORAL
  Filled 2021-11-02: qty 1

## 2021-11-02 MED ORDER — METHYLPREDNISOLONE SODIUM SUCC 40 MG IJ SOLR
20.0000 mg | Freq: Once | INTRAMUSCULAR | Status: AC
  Administered 2021-11-02: 20 mg via INTRAVENOUS
  Filled 2021-11-02: qty 1

## 2021-11-02 MED ORDER — INSULIN ASPART 100 UNIT/ML IJ SOLN: 0.0000 [IU] | Freq: Every day | INTRAMUSCULAR | Status: DC

## 2021-11-02 NOTE — Progress Notes (Signed)
No charge note. Pt seen and examined. H/p reviewed.  Ashley Camacho is a 31 y.o. 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  On arrival of EMS blood sugar was in the 20s.  They were unable to obtain IV access and administered glucagon with improvement in glucose to the 40s.  She was lethargic on arrival to the ED whereupon IV access was established and she received an amp of D50 with return of full consciousness.  Patient initially remained stable in the ED and was eating a meal and blood sugar went up to 132 however she again became lethargic and blood sugar rechecked was 30.  She was a administered another amp of D50 and hospitalist consulted for admission. Patient denies recent change in insulin dose, denies poor oral intake, nausea vomiting or diarrhea.  Denies cough, fever or chills.  States that she was in her usual state of health. Additional chart review: Vitals significant for 91/54 that improved with a bolus to 108/65 by admission.  Initial labs with glucose 40, creatinine 1.94 which is at baseline, WBC 3.9, hemoglobin 9.6 and platelets 103000. EKG, personally viewed and interpreted with sinus tachycardia at 100 with no acute ST-T wave changes.   Chest x-ray unremarkable patient admitted to stepdown unit Patient seen has no complaints currently.  Denies shortness of breath or chest pain.  Mg and phosph low  Cta  Reg s1`/s2  Soft benign No edema  A/P Replace electrolytes Continue current care

## 2021-11-02 NOTE — ED Provider Notes (Signed)
[facility] Provider Note    Event Date/Time   First MD Initiated Contact with Patient 11/02/21 0006     (approximate)   History   Hypoglycemia   HPI  Ashley Camacho is a 31 y.o. female who presents to the ED for evaluation of Hypoglycemia   I review dc summary from 5/5. She was admitted for 4 days . Hx CHF 20%, T1DM, adrenal insuff.   Patient presents to the ED from home by EMS for evaluation of poor responsiveness and hypoglycemia.  Family told first responders that she gets like this when her sugar is low.  They found her with CBG in the 20s.  Could not get IV access to provide D50, so they gave IM glucagon with improvement of her glucose to the 40s but not resolution of her mental status.  Here in the ED, she arrives poorly responsive and unable to provide any history.  We quickly acquire IV access and provide an amp of D50 with her return to consciousness.  She reports that she does not know what happened.  Did not provide herself with extra insulin on purpose.  Denies suicidality.  Denies recent illnesses.  Denies fluid losses such as nausea, vomiting and diarrhea.  She was briefly outside this morning before it was too hot, but no exertion this afternoon or sweating. Reports feeling better now, but is somewhat dizzy.   Physical Exam   Triage Vital Signs: ED Triage Vitals  Enc Vitals Group     BP      Pulse      Resp      Temp      Temp src      SpO2      Weight      Height      Head Circumference      Peak Flow      Pain Score      Pain Loc      Pain Edu?      Excl. in GC?     Most recent vital signs: Vitals:   11/02/21 0115 11/02/21 0145  BP: (!) 91/54 108/65  Pulse: 81 99  Resp: 14 16  Temp:    SpO2: 99% 100%    General: On arrival to the ED, she is somnolent and poorly responsive.  Opens eyes to sternal rub and looks at me, but does not phonate or interact more than this. After D50, she is sitting upright in bed, pleasant  and conversational. CV:  Good peripheral perfusion.  Resp:  Normal effort.  Abd:  No distention. Soft and benign throughout. MSK:  No deformity noted.  Neuro:  No focal deficits appreciated. Cranial nerves II through XII intact 5/5 strength and sensation in all 4 extremities  Other:     ED Results / Procedures / Treatments   Labs (all labs ordered are listed, but only abnormal results are displayed) Labs Reviewed  CBC WITH DIFFERENTIAL/PLATELET - Abnormal; Notable for the following components:      Result Value   WBC 3.9 (*)    RBC 3.26 (*)    Hemoglobin 9.6 (*)    HCT 30.3 (*)    Platelets 103 (*)    All other components within normal limits  BASIC METABOLIC PANEL - Abnormal; Notable for the following components:   Chloride 114 (*)    Glucose, Bld 40 (*)    BUN 36 (*)    Creatinine, Ser 1.94 (*)    GFR,  Estimated 35 (*)    Anion gap 3 (*)    All other components within normal limits  CBG MONITORING, ED - Abnormal; Notable for the following components:   Glucose-Capillary 132 (*)    All other components within normal limits  CBG MONITORING, ED - Abnormal; Notable for the following components:   Glucose-Capillary 36 (*)    All other components within normal limits  CBG MONITORING, ED - Abnormal; Notable for the following components:   Glucose-Capillary 33 (*)    All other components within normal limits  URINALYSIS, ROUTINE W REFLEX MICROSCOPIC  POC URINE PREG, ED    EKG Sinus tachycardia with a rate of 100 bpm.  Normal axis.  Prolonged QTc of 529 ms.  No evidence of acute ischemia.  RADIOLOGY   Official radiology report(s): No results found.  PROCEDURES and INTERVENTIONS:  .1-3 Lead EKG Interpretation  Performed by: Delton Prairie, MD Authorized by: Delton Prairie, MD     Interpretation: normal     ECG rate:  97   ECG rate assessment: normal     Rhythm: sinus rhythm     Ectopy: none     Conduction: normal   .Critical Care  Performed by: Delton Prairie,  MD Authorized by: Delton Prairie, MD   Critical care provider statement:    Critical care time (minutes):  30   Critical care time was exclusive of:  Separately billable procedures and treating other patients   Critical care was necessary to treat or prevent imminent or life-threatening deterioration of the following conditions:  Metabolic crisis   Critical care was time spent personally by me on the following activities:  Development of treatment plan with patient or surrogate, discussions with consultants, evaluation of patient's response to treatment, examination of patient, ordering and review of laboratory studies, ordering and review of radiographic studies, ordering and performing treatments and interventions, pulse oximetry, re-evaluation of patient's condition and review of old charts   Medications  lactated ringers bolus 1,000 mL (0 mLs Intravenous Stopped 11/02/21 0206)  dextrose 50 % solution 50 mL (50 mLs Intravenous Given 11/02/21 0205)     IMPRESSION / MDM / ASSESSMENT AND PLAN / ED COURSE  I reviewed the triage vital signs and the nursing notes.  Differential diagnosis includes, but is not limited to, insulin overdose, cardiogenic syncope, accidental overdose or intentional overdose, poor p.o. intake, dehydration, pneumonia, cystitis  {Patient presents with symptoms of an acute illness or injury that is potentially life-threatening.  31 year old type I diabetic presents to the ED with recurrent hypoglycemia requiring medical admission.  She presents somnolent and poorly responsive, resolved with an amp of D50.  She is observed for about 2 hours after this, sitting up in bed and looking well but has recurrence of her hypoglycemia despite her good p.o. intake requiring another amp of D50.  I am hesitant to start a D10 drip due to her poor ejection fraction.  She received 200 cc of LR in the ED.  No signs of volume overload.  Blood work with chronic anemia around baseline.  CKD around  baseline.  EKG with prolonged QTc and she is kept on the monitor without dysrhythmias and I think cardiogenic syncope is less likely.  Due to her recurrent hypoglycemia we will consult medicine for admission.  Clinical Course as of 11/02/21 0215  Mon Nov 02, 2021  0010 Rapidly improving mental status after D50 administration. [DS]  0018 Reassessed.  Still looks well.  Repeat 211. [DS]  0141 Reassessed  and acquired some additional history.  She looks well.  Eating some food and sitting up in bed.  Does not know what happened today.  Denies any suicidal thoughts or intent [DS]  0157 Reassessed and provided some water [DS]  0205 Patient called out because she was feeling "weird".  Repeat shows CBG of 33.  I immediately reevaluated the patient and she is still sitting upright but reports feeling weak and unusual as if her sugars were low.  Nurses are providing D50 right now.  We discussed my recommendation for medical admission and she is agreeable.  Hospitalist paged. [DS]  0215 I consult with medicine who agrees to admit [DS]    Clinical Course User Index [DS] Delton Prairie, MD     FINAL CLINICAL IMPRESSION(S) / ED DIAGNOSES   Final diagnoses:  Hypoglycemia due to type 1 diabetes mellitus (HCC)  Prolonged Q-T interval on ECG     Rx / DC Orders   ED Discharge Orders     None        Note:  This document was prepared using Dragon voice recognition software and may include unintentional dictation errors.   Delton Prairie, MD 11/02/21 [id number]

## 2021-11-02 NOTE — Assessment & Plan Note (Signed)
Continue levothyroxine and will check TSH

## 2021-11-02 NOTE — Assessment & Plan Note (Signed)
Continue home Remeron 

## 2021-11-02 NOTE — Consult Note (Signed)
NAME:  Ashley Camacho, MRN:  [id number], DOB:  12/14/1990, LOS: 0 ADMISSION DATE:  11/02/2021, CONSULTATION DATE:  11/02/2021 REFERRING MD:  Dr. Damita Dunnings, CHIEF COMPLAINT:  Hypoglycemia   Brief Pt Description / Synopsis:  31 y.o. female admitted with Refractory Hypoglycemia due to Suspected Adrenal Insufficiency requiring D10 infusion.  History of Present Illness:  31 yo F presenting to [facility] ED from home via EMS with complaints of poor responsiveness and hypoglycemia (CBG's in 20's). Per ED documentation, EMS could not get IV access so glucagon given IM with improvement of glucose to the 40's- patient remained altered. on arrival to the ED whereupon IV access was established and she received an amp of D50 with return of full consciousness.  Patient initially remained stable in the ED and was eating a meal and blood sugar went up to 132 however she again became lethargic and blood sugar rechecked was 30.  She was a administered another amp of D50.  Patient reports that she has not been in her usual state of health until this acute episode of hypoglycemia.  She denies any recent history of viral-like symptoms, chest pain, shortness of breath, fever, chills, nausea, vomiting, diarrhea.  She reports compliance with prescribed insulin regimen and steroids.  She does report that she has ran out of her home Synthroid and has been unable to get it refilled.  Of note she was recently admitted at [facility] from 5/1 through 5/4 for rhinovirus with associated diarrhea and vomiting.  ED Course: Upon arrival to ED patient poorly responsive, upon IV placement and D 50 administration mental status improved. She denied administering extra insulin, denied recent illnesses or symptoms including nausea/vomiting/diarrhea denies feeling suicidal. She reports being outside this morning without exertion, and is unsure what happened. Medications given: 3 amps of D50, D10 infusion started, 1 L LR bolus, solu medrol 20 mg &  solu medrol 40 mg Initial Vitals: afebrile at 98, slightly tachypneic - 21, NSR- 99, 113/70 & SpO2 100% on RA Significant labs: (Labs/ Imaging personally reviewed) I, Domingo Pulse [place], AGACNP-BC, personally viewed and interpreted this ECG. EKG Interpretation: Date: 11/02/21, EKG Time: 00:08, Rate: 100, Rhythm: ST, QRS Axis:  borderline LAD, Intervals: prolonged Qtc, ST/T Wave abnormalities: non specific T wave abnormalities, Narrative Interpretation: ST with prolonged QTc Chemistry: Na+:140, K+: 4.0, BUN/Cr.: 36/1.94, Serum CO2/ AG: 23/3 Hematology: WBC: 3.9, Hgb: 9.6,  PCT: pending, UA: pending   CXR 11/02/21: no active disease   TRH consulted to admit. PCCM consulted for assistance in management and monitoring due to suspected adrenal insufficiency and severe hypoglycemia.    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 postpartum CM Hypothyroidism CKD Stage 3b DVT (11/2020- not on anticoagulation) T1DM HTN Adrenal Insufficiency Anemia Gastroparesis  Micro Data:  Mononucleosis screen>> HIV antibody>>  Antimicrobials:  N/A  Significant Hospital Events: Including procedures, antibiotic start and stop dates in addition to other pertinent events   11/02/21: Admit to SDU with refractory hypoglycemia and suspected adrenal insufficiency  Interim History / Subjective:  -Patient seen upon arrival to ICU -Patient is awake and alert, glucose is now stabilized in the 200s ~will wean D10 infusion and follow CBGs -Afebrile, hemodynamically stable -Thyroid function found to be low, patient does admit to running out of Synthroid and has been able to get it refilled. -Cortisol still pending -Labs also notable for pancytopenia, (known history of anemia and thrombocytopenia), negative for pregnancy,  will rule out viral illnesses including mono and HIV  Objective   Blood pressure  110/67, pulse 94, temperature 98 F (36.7 C), temperature source Oral, resp. rate (!) 21, height 5\' 1"  (1.549 m), weight 51 kg, SpO2 100 %, not currently breastfeeding.        Intake/Output Summary (Last 24 hours) at 11/02/2021 [id number] Last data filed at 11/02/2021 0206 Gross per 24 hour  Intake 200 ml  Output --  Net 200 ml   Filed Weights   11/02/21 0014  Weight: 51 kg    Examination: General: Acutely ill-appearing female, laying in bed, on room air, no acute distress HENT: Atraumatic, normocephalic, neck supple, no JVD Lungs: Breath sounds clear throughout, no wheezing or rhonchi noted, even, nonlabored Cardiovascular: Regular rate and rhythm, S1-S2, no murmurs, rubs, gallops Abdomen: Soft, nontender, nondistended, no guarding rebound tenderness, bowel sounds positive x4 Extremities: Normal bulk and tone, no edema, no deformities Neuro: Awake and alert, oriented x4, moves all extremities to commands, no focal deficits, speech clear GU: Deferred Skin: Petechia noted to left antecubital area, reddened lesion to right upper back without ecchymosis or erythema noted (see image below)     Resolved Hospital Problem list     Assessment & Plan:   Refractory Hypoglycemia Suspected Adrenal Insufficiency PMHx: DM Type I Patient received total of 60 mg of solu medrol this AM -start solu-cortef SDS 100 mg Q 8h -continue D10 infusion to maintain CBG > 100 -Q 1 h CBG checks -f/u cortisol level -Consult Diabetes Coordinator, appreciate input -Check TSH and Thyroid panel ~ TSH high at 4.8, Free T4 low at 0.59 ~ continue home Synthroid 0.25 mcg as pt reports she ran out of home synthroid and has been unable to get it refilled  Pancytopenia PMHx: Anemia, Thrombocytopenia -Monitor for S/Sx of bleeding -Trend CBC -Lovenox for VTE Prophylaxis  -Transfuse for Hgb <7 -Transfuse platelets for platelets <50 with active bleeding -Obtain peripheral blood smear -Negative for pregnancy -Check  LFT's -CXR negative for active disease, UA pending -Denies any Upper respiratory, GI symptoms, tick bites, or wounds ~ will rule out other viral syndromes including Mono & HIV    Best Practice (right click and "Reselect all SmartList Selections" daily)   Diet/type: Regular consistency (see orders) DVT prophylaxis: LMWH GI prophylaxis: PPI Lines: N/A Foley:  N/A Code Status:  full code Last date of multidisciplinary goals of care discussion [6/12]  Labs   CBC: Recent Labs  Lab 11/02/21 0019  WBC 3.9*  NEUTROABS 2.0  HGB 9.6*  HCT 30.3*  MCV 92.9  PLT 103*    Basic Metabolic Panel: Recent Labs  Lab 11/02/21 0019 11/02/21 0639  NA 140  --   K 4.0  --   CL 114*  --   CO2 23  --   GLUCOSE 40*  --   BUN 36*  --   CREATININE 1.94*  --   CALCIUM 9.0  --   MG  --  1.6*  PHOS  --  <1.0*   GFR: Estimated Creatinine Clearance: 32 mL/min (A) (by C-G formula based on SCr of 1.94 mg/dL (H)). Recent Labs  Lab 11/02/21 0019 11/02/21 0639  PROCALCITON  --  0.85  WBC 3.9*  --     Liver Function Tests: No results for input(s): "AST", "ALT", "ALKPHOS", "BILITOT", "PROT", "ALBUMIN" in the last 168 hours. No results for input(s): "LIPASE", "AMYLASE" in the last 168 hours. No results for input(s): "AMMONIA" in the last 168 hours.  ABG  Component Value Date/Time   PHART 7.38 12/13/2020 0857   PCO2ART 20 (L) 12/13/2020 0857   PO2ART 90 12/13/2020 0857   HCO3 26.4 06/14/2021 2337   ACIDBASEDEF 5.1 (H) 06/14/2021 1015   O2SAT 94.6 06/14/2021 2337     Coagulation Profile: No results for input(s): "INR", "PROTIME" in the last 168 hours.  Cardiac Enzymes: No results for input(s): "CKTOTAL", "CKMB", "CKMBINDEX", "TROPONINI" in the last 168 hours.  HbA1C: Hgb A1c MFr Bld  Date/Time Value Ref Range Status  04/27/2021 08:29 AM 6.6 (H) 4.8 - 5.6 % Final    Comment:    (NOTE)         Prediabetes: 5.7 - 6.4         Diabetes: >6.4         Glycemic control for adults  with diabetes: <7.0   02/21/2021 03:21 AM 5.4 4.8 - 5.6 % Final    Comment:    (NOTE) Pre diabetes:          5.7%-6.4%  Diabetes:              >6.4%  Glycemic control for   <7.0% adults with diabetes     CBG: Recent Labs  Lab 11/02/21 0654 11/02/21 0713 11/02/21 0743 11/02/21 0828 11/02/21 0909  GLUCAP 85 70 77 119* 168*    Review of Systems:   Positives in BOLD: Pt currently denies all complaints Gen: Denies fever, chills, weight change, fatigue, night sweats HEENT: Denies blurred vision, double vision, hearing loss, tinnitus, sinus congestion, rhinorrhea, sore throat, neck stiffness, dysphagia PULM: Denies shortness of breath, cough, sputum production, hemoptysis, wheezing CV: Denies chest pain, edema, orthopnea, paroxysmal nocturnal dyspnea, palpitations GI: Denies abdominal pain, nausea, vomiting, diarrhea, hematochezia, melena, constipation, change in bowel habits GU: Denies dysuria, hematuria, polyuria, oliguria, urethral discharge Endocrine: Denies hot or cold intolerance, polyuria, polyphagia or appetite change Derm: Denies rash, dry skin, scaling or peeling skin change Heme: Denies easy bruising, bleeding, bleeding gums Neuro: Denies headache, numbness, weakness, slurred speech, loss of memory or consciousness   Past Medical History:  She,  has a past medical history of Adrenal insufficiency (HCC), Anemia, Gastroparesis, Hypertension, and Type 1 diabetes ([place]).   Surgical History:   Past Surgical History:  Procedure Laterality Date   BIOPSY  01/14/2021   Procedure: BIOPSY;  Surgeon: Thornton Park, MD;  Location: [facility] ENDOSCOPY;  Service: Gastroenterology;;   CESAREAN SECTION     CESAREAN SECTION WITH BILATERAL TUBAL LIGATION N/A 02/17/2021   Procedure: CESAREAN SECTION WITH BILATERAL TUBAL LIGATION;  Surgeon: Truett Mainland, DO;  Location: [place] LD ORS;  Service: Obstetrics;  Laterality: N/A;   ESOPHAGOGASTRODUODENOSCOPY (EGD) WITH PROPOFOL N/A 01/14/2021    Procedure: ESOPHAGOGASTRODUODENOSCOPY (EGD) WITH PROPOFOL;  Surgeon: Thornton Park, MD;  Location: [place];  Service: Gastroenterology;  Laterality: N/A;   MOUTH SURGERY       Social History:   reports that she has never smoked. She has never used smokeless tobacco. She reports that she does not drink alcohol and does not use drugs.   Family History:  Her family history includes Breast cancer in her mother; CAD in her father, paternal grandfather, and paternal grandmother; Diabetes type I in her father. There is no history of Ovarian cancer.   Allergies No Known Allergies   Home Medications  Prior to Admission medications   Medication Sig Start Date End Date Taking? Authorizing Provider  acetaminophen (TYLENOL) 500 MG tablet Take 2 tablets (1,000 mg total) by mouth every 6 (six)  hours as needed for headache, fever or moderate pain. 03/04/21  Yes Constant, Peggy, MD  carvedilol (COREG) 12.5 MG tablet Take 12.5 mg by mouth 2 (two) times daily. 09/13/21  Yes [provider]  escitalopram (LEXAPRO) 10 MG tablet Take 10 mg by mouth daily. 09/01/21  Yes [provider]  furosemide (LASIX) 20 MG tablet Take 20 mg by mouth as directed. 09/04/21  Yes [provider]  hydrocortisone (CORTEF) 10 MG tablet Take 1 tablet (10 mg total) by mouth once every morning. Patient taking differently: Take 10-20 mg by mouth 2 (two) times daily. Take 2 tablets (20 mg) in the morning and 1 tablet (10 mg) in the evening. 06/19/21  Yes Jennye Boroughs, MD  insulin aspart (NOVOLOG) 100 UNIT/ML injection Inject 2 Units into the skin 3 (three) times daily with meals. Patient taking differently: Inject 3 Units into the skin 3 (three) times daily with meals. 06/19/21  Yes Jennye Boroughs, MD  insulin glargine (LANTUS) 100 UNIT/ML injection Inject 0.04 mLs (4 Units total) into the skin 2 (two) times daily. Inject 4 units in the morning and 4 units in the evening Patient taking differently: Inject  4-6 Units into the skin 2 (two) times daily. Inject 4 units in the morning and 4 units in the evening. May add additional 2 units for large snacks once a day. 06/19/21 11/02/21 Yes Jennye Boroughs, MD  levothyroxine (SYNTHROID) 25 MCG tablet Take 1 tablet (25 mcg total) by mouth once daily at 6 (six) AM. 06/19/21  Yes Jennye Boroughs, MD  torsemide (DEMADEX) 20 MG tablet Take 40 mg by mouth 2 (two) times daily. 09/16/21  Yes [provider]  HUMALOG 100 UNIT/ML injection Inject 3 Units into the skin in the morning, at noon, and at bedtime. Patient not taking: Reported on 11/02/2021 09/16/21   [provider]  hydrocortisone (CORTEF) 5 MG tablet Take 1 tablet (5 mg total) by mouth once daily at 4PM. Patient not taking: Reported on 11/02/2021 06/19/21   Jennye Boroughs, MD  Insulin Syringe-Needle U-100 (BD INSULIN SYRINGE U/F) 31G X 5/16" 0.3 ML MISC Use as directed 5 times daily with Lantus and Novolog. 06/19/21   Jennye Boroughs, MD  KLOR-CON M20 20 MEQ tablet Take 20 mEq by mouth as directed. Patient not taking: Reported on 11/02/2021 09/04/21   [provider]  losartan (COZAAR) 25 MG tablet Take 12.5 mg by mouth daily. Patient not taking: Reported on 11/02/2021 06/30/21   [provider]  magnesium oxide (MAG-OX) 400 MG tablet Take 400 mg by mouth 2 (two) times daily. Patient not taking: Reported on 11/02/2021 09/16/21   [provider]  mirtazapine (REMERON) 30 MG tablet Take 1 tablet (30 mg total) by mouth at bedtime. Patient not taking: Reported on 11/02/2021 06/19/21   Jennye Boroughs, MD  Multiple Vitamin (MULTIVITAMIN WITH MINERALS) TABS tablet Take 1 tablet by mouth daily. Patient not taking: Reported on 05/20/2021 05/11/21   Nicole Kindred A, DO  pantoprazole (PROTONIX) 40 MG tablet Take 1 tablet (40 mg total) by mouth daily. Patient not taking: Reported on 11/02/2021 06/20/21   Jennye Boroughs, MD     Critical care time: 63 minutes     Darel Hong,  AGACNP-BC [facility] Prefer epic messenger for cross cover needs If after hours, please call E-link

## 2021-11-02 NOTE — ED Triage Notes (Signed)
Pt arrives via [facility] from home with CC of hypoglycemia. Fire department reported CBG of 26 upon arrival. EMS gave 1mg  glucagon in L deltoid and got CBG of 42. Upon arrival POC CBG 40. 1 amp of D50 given. Pt lethargic but more easily aroused after medication intervention.

## 2021-11-02 NOTE — Assessment & Plan Note (Addendum)
Nonischemic cardiomyopathy Appears compensated Monitor for fluid overload with D10 infusion Daily weights

## 2021-11-02 NOTE — ED Notes (Signed)
Secure chat sent to MD: "Pts blood sugar went from 68 to 48 in 17 mins with the D10 running. I ordered and gave another amp of D50. BP also dropped again but it seems to correlate with her blood sugar and is now back up at 103/65 (76). CBG is now 175"

## 2021-11-02 NOTE — H&P (Signed)
History and Physical    Patient: Ashley Camacho [id number] DOB: 24-Oct-1990 DOA: 11/02/2021 DOS: the patient was seen and examined on 11/02/2021 PCP: Pcp, No  Patient coming from: Home  Chief Complaint:  Chief Complaint  Patient presents with   Hypoglycemia    HPI: Ashley Camacho is a 31 y.o. 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  On arrival of EMS blood sugar was in the 20s.  They were unable to obtain IV access and administered glucagon with improvement in glucose to the 40s.  She was lethargic on arrival to the ED whereupon IV access was established and she received an amp of D50 with return of full consciousness.  Patient initially remained stable in the ED and was eating a meal and blood sugar went up to 132 however she again became lethargic and blood sugar rechecked was 30.  She was a administered another amp of D50 and hospitalist consulted for admission. Patient denies recent change in insulin dose, denies poor oral intake, nausea vomiting or diarrhea.  Denies cough, fever or chills.  States that she was in her usual state of health. Additional chart review: Vitals significant for 91/54 that improved with a bolus to 108/65 by admission.  Initial labs with glucose 40, creatinine 1.94 which is at baseline, WBC 3.9, hemoglobin 9.6 and platelets 103000. EKG, personally viewed and interpreted with sinus tachycardia at 100 with no acute ST-T wave changes.  Chest x-ray report pending. Patient will be admitted to stepdown.   Review of Systems: As mentioned in the history of present illness. All other systems reviewed and are negative.  Past Medical History:   Diagnosis Date   Adrenal insufficiency ([place])    Anemia    Gastroparesis    Hypertension    Type 1 diabetes ([place])    Past Surgical History:  Procedure Laterality Date   BIOPSY  01/14/2021   Procedure: BIOPSY;  Surgeon: Thornton Park, MD;  Location: [place];  Service: Gastroenterology;;   CESAREAN SECTION     CESAREAN SECTION WITH BILATERAL TUBAL LIGATION N/A 02/17/2021   Procedure: CESAREAN SECTION WITH BILATERAL TUBAL LIGATION;  Surgeon: Truett Mainland, DO;  Location: [place] LD ORS;  Service: Obstetrics;  Laterality: N/A;   ESOPHAGOGASTRODUODENOSCOPY (EGD) WITH PROPOFOL N/A 01/14/2021   Procedure: ESOPHAGOGASTRODUODENOSCOPY (EGD) WITH PROPOFOL;  Surgeon: Thornton Park, MD;  Location: [place];  Service: Gastroenterology;  Laterality: N/A;   MOUTH SURGERY     Social History:  reports that she has never smoked. She has never used smokeless tobacco. She reports that she does not drink alcohol and does not use drugs.  No Known Allergies  Family History  Problem Relation Age of Onset   Diabetes type I Father    CAD Father    CAD Paternal Grandmother    CAD Paternal Grandfather    Breast cancer Mother    Ovarian cancer Neg Hx     Prior to Admission medications   Medication Sig Start Date End Date Taking? Authorizing Provider  acetaminophen (TYLENOL) 500 MG tablet Take 2 tablets (1,000 mg total) by mouth every 6 (six) hours as needed for headache, fever or moderate pain. 03/04/21   Constant, Peggy, MD  hydrocortisone (CORTEF) 10 MG  tablet Take 1 tablet (10 mg total) by mouth once every morning. 06/19/21   Jennye Boroughs, MD  hydrocortisone (CORTEF) 5 MG tablet Take 1 tablet (5 mg total) by mouth once daily at 4PM. 06/19/21   Jennye Boroughs, MD  insulin aspart (NOVOLOG) 100 UNIT/ML injection Inject 2 Units into the skin 3 (three) times daily with meals. 06/19/21   Jennye Boroughs, MD  insulin glargine (LANTUS) 100 UNIT/ML injection Inject 0.04 mLs (4 Units total) into the skin  2 (two) times daily. Inject 4 units in the morning and 4 units in the evening 06/19/21 10/17/21  Jennye Boroughs, MD  Insulin Syringe-Needle U-100 (BD INSULIN SYRINGE U/F) 31G X 5/16" 0.3 ML MISC Use as directed 5 times daily with Lantus and Novolog. 06/19/21   Jennye Boroughs, MD  levothyroxine (SYNTHROID) 25 MCG tablet Take 1 tablet (25 mcg total) by mouth once daily at 6 (six) AM. 06/19/21   Jennye Boroughs, MD  mirtazapine (REMERON) 30 MG tablet Take 1 tablet (30 mg total) by mouth at bedtime. 06/19/21   Jennye Boroughs, MD  Multiple Vitamin (MULTIVITAMIN WITH MINERALS) TABS tablet Take 1 tablet by mouth daily. Patient not taking: Reported on 05/20/2021 05/11/21   Nicole Kindred A, DO  pantoprazole (PROTONIX) 40 MG tablet Take 1 tablet (40 mg total) by mouth daily. 06/20/21   Jennye Boroughs, MD    Physical Exam: Vitals:   11/02/21 0014 11/02/21 0038 11/02/21 0115 11/02/21 0145  BP: 113/70  (!) 91/54 108/65  Pulse: 99  81 99  Resp: (!) 21  14 16   Temp:  98 F (36.7 C)    TempSrc:  Oral    SpO2: 100%  99% 100%  Weight: 51 kg     Height: 5\' 1"  (1.549 m)      Physical Exam Vitals and nursing note reviewed.  Constitutional:      General: She is sleeping. She is not in acute distress.    Comments: Frail-appearing female.  Sleeping but easily aroused  HENT:     Head: Normocephalic and atraumatic.  Cardiovascular:     Rate and Rhythm: Normal rate and regular rhythm.     Heart sounds: Normal heart sounds.  Pulmonary:     Effort: Pulmonary effort is normal.     Breath sounds: Normal breath sounds.  Abdominal:     Palpations: Abdomen is soft.     Tenderness: There is no abdominal tenderness.  Neurological:     Mental Status: She is easily aroused. Mental status is at baseline.     Labs on Admission: I have personally reviewed following labs and imaging studies  CBC: Recent Labs  Lab 11/02/21 0019  WBC 3.9*  NEUTROABS 2.0  HGB 9.6*  HCT 30.3*  MCV 92.9  PLT 103*   Basic  Metabolic Panel: Recent Labs  Lab 11/02/21 0019  NA 140  K 4.0  CL 114*  CO2 23  GLUCOSE 40*  BUN 36*  CREATININE 1.94*  CALCIUM 9.0   GFR: Estimated Creatinine Clearance: 32 mL/min (A) (by C-G formula based on SCr of 1.94 mg/dL (H)). Liver Function Tests: No results for input(s): "AST", "ALT", "ALKPHOS", "BILITOT", "PROT", "ALBUMIN" in the last 168 hours. No results for input(s): "LIPASE", "AMYLASE" in the last 168 hours. No results for input(s): "AMMONIA" in the last 168 hours. Coagulation Profile: No results for input(s): "INR", "PROTIME" in the last 168 hours. Cardiac Enzymes: No results for input(s): "CKTOTAL", "CKMB", "CKMBINDEX", "TROPONINI" in the last 168 hours. BNP (last 3 results)  No results for input(s): "PROBNP" in the last 8760 hours. HbA1C: No results for input(s): "HGBA1C" in the last 72 hours. CBG: Recent Labs  Lab 11/02/21 0102 11/02/21 0200 11/02/21 0238  GLUCAP 132* 33*  36* 124*   Lipid Profile: No results for input(s): "CHOL", "HDL", "LDLCALC", "TRIG", "CHOLHDL", "LDLDIRECT" in the last 72 hours. Thyroid Function Tests: No results for input(s): "TSH", "T4TOTAL", "FREET4", "T3FREE", "THYROIDAB" in the last 72 hours. Anemia Panel: No results for input(s): "VITAMINB12", "FOLATE", "FERRITIN", "TIBC", "IRON", "RETICCTPCT" in the last 72 hours. Urine analysis:    Component Value Date/Time   COLORURINE YELLOW 06/14/2021 1808   APPEARANCEUR CLEAR 06/14/2021 1808   APPEARANCEUR Clear 09/18/2020 1153   LABSPEC 1.025 06/14/2021 1808   LABSPEC 1.026 03/01/2013 1844   PHURINE 5.0 06/14/2021 1808   GLUCOSEU NEGATIVE 06/14/2021 1808   GLUCOSEU >=500 03/01/2013 1844   HGBUR LARGE (A) 06/14/2021 1808   BILIRUBINUR SMALL (A) 06/14/2021 1808   BILIRUBINUR neg 10/23/2020 0953   BILIRUBINUR Negative 09/18/2020 1153   BILIRUBINUR Negative 03/01/2013 1844   KETONESUR 15 (A) 06/14/2021 1808   PROTEINUR >300 (A) 06/14/2021 1808   UROBILINOGEN 0.2 10/23/2020  0953   NITRITE NEGATIVE 06/14/2021 1808   LEUKOCYTESUR NEGATIVE 06/14/2021 1808   LEUKOCYTESUR Negative 03/01/2013 1844    Radiological Exams on Admission: No results found.   Data Reviewed: Relevant notes from primary care and specialist visits, past discharge summaries as available in EHR, including Care Everywhere. Prior diagnostic testing as pertinent to current admission diagnoses Updated medications and problem lists for reconciliation ED course, including vitals, labs, imaging, treatment and response to treatment Triage notes, nursing and pharmacy notes and ED provider's notes Notable results as noted in HPI   Assessment and Plan: * Hypoglycemia due to type 1 diabetes mellitus ([place]) Acute metabolic encephalopathy secondary to hypoglycemia Received 2 A of D50 in the ED, glucagon in the field Continue D10 and monitor for fluid overload in view of HFrEF/cardiomyopathy CBG every hour Monitor in stepdown Resume insulin when appropriate  Adrenal insufficiency ([place]) Patient denies missing doses of oral steroids We will give a dose of stress dose steroids and can resume oral dosing when more stable  Thrombocytopenia (HCC) Appears chronic, stable  Chronic kidney disease, stage 3b ([place]) Renal function at baseline  HFrEF (heart failure with reduced ejection fraction) ([place]) Nonischemic cardiomyopathy Appears compensated Monitor for fluid overload with D10 infusion Daily weights  Hypothyroidism Continue levothyroxine and will check TSH  Depression Continue home Remeron        DVT prophylaxis: Lovenox  Consults: none  Advance Care Planning:   Code Status: Prior   Family Communication: none  Disposition Plan: Back to previous home environment  Severity of Illness: The appropriate patient status for this patient is INPATIENT. Inpatient status is judged to be reasonable and necessary in order to provide the required intensity of service to ensure the patient's  safety. The patient's presenting symptoms, physical exam findings, and initial radiographic and laboratory data in the context of their chronic comorbidities is felt to place them at high risk for further clinical deterioration. Furthermore, it is not anticipated that the patient will be medically stable for discharge from the hospital within 2 midnights of admission.   * I certify that at the point of admission it is my clinical judgment that the patient will require inpatient hospital care spanning beyond 2 midnights from the point of admission due to high intensity of service, high risk for further deterioration and high frequency  of surveillance required.*  Author: Athena Masse, MD 11/02/2021 2:49 AM  For on call review [email or web address].

## 2021-11-02 NOTE — Progress Notes (Signed)
ICU NEEDS RESOLVED PLEASE CALL ICU TEAM WITH ANY ISSUES  Vital signs reviewed,  Will sign off at this time. No further recommendations at this time.    Lucie Leather, M.D.  [facility]  Medical Director [facility] Medical Director [facility] Cardio-Pulmonary Department

## 2021-11-02 NOTE — Assessment & Plan Note (Signed)
Appears chronic, stable

## 2021-11-02 NOTE — ED Notes (Signed)
Pt alert, calm, resp reg/unlabored, denies pain or any needs currently.

## 2021-11-02 NOTE — Inpatient Diabetes Management (Incomplete)
Inpatient Diabetes Program Recommendations  AACE/ADA: New Consensus Statement on Inpatient Glycemic Control (2015)  Target Ranges:  Prepandial:   less than 140 mg/dL      Peak postprandial:   less than 180 mg/dL (1-2 hours)      Critically ill patients:  140 - 180 mg/dL   Lab Results  Component Value Date   GLUCAP 208 (H) 11/02/2021   HGBA1C 6.6 (H) 04/27/2021    Review of Glycemic Control  Latest Reference Range & Units 11/02/21 09:38 11/02/21 10:17 11/02/21 11:17 11/02/21 13:41  Glucose-Capillary 70 - 99 mg/dL 536 (H) 644 (H) 034 (H) 208 (H)  (H): Data is abnormally high  Diabetes history: DM1(does not make insulin.  Needs correction, basal and meal coverage)  Outpatient Diabetes medications: Lantus 4 units TID, Novolog 2 units TID and SSI correction, Cortef 5 mg QD  Current orders for Inpatient glycemic control: Solumedrol 20 mg BID  Inpatient Diabetes Program Recommendations:    Novolog 0-6 units TID and 0-5 units QHS Semglee 3 units BID Once eating Novolog 2 units tID IF consumes at least 50%  Spoke with patient at bedside.  She confirms above home medications.  SHe has been taking her steroids at home and has noticed an increase in her CBGs.  SHe does not check her CBG consistently.  She felt like her blood sugar was elevated last night so she gave herself Novolog (does not remember how much).  She did not check her BG at that time.    She has the Dexcom and T-Slim insulin pump at home.  She states she is scheduled for pump traning with ENDO at [facility] sometime this month.  Cannot remember exactly when the appointment is scheduled.

## 2021-11-02 NOTE — Assessment & Plan Note (Signed)
Renal function at baseline 

## 2021-11-02 NOTE — Progress Notes (Signed)
Admission profile updated. ?

## 2021-11-02 NOTE — Assessment & Plan Note (Signed)
Patient denies missing doses of oral steroids We will give a dose of stress dose steroids and can resume oral dosing when more stable

## 2021-11-02 NOTE — Assessment & Plan Note (Addendum)
Acute metabolic encephalopathy secondary to hypoglycemia Received 2 A of D50 in the ED, glucagon in the field Continue D10 and monitor for fluid overload in view of HFrEF/cardiomyopathy CBG every hour Monitor in stepdown Resume insulin when appropriate

## 2021-11-02 NOTE — Progress Notes (Signed)
Type 1 Diabetes Mellitus Steroid Induced Hyperglycemia Patient admitted with refractory hypoglycemia, SDS added for suspected adrenal insufficiency. Now hyperglycemic with CBG in 300's. Insulin drip started to lower blood sugar safely and avoid DKA. - Phase 2 ICU glycemic protocol initiated, start Insulin drip ordered per Endo tool protocol - Strict I/O's: alert provider if UOP < 0.5 mL/kg/hr - f/u BMP, Mg, Phos, VBG @ 2300, replace electrolytes PRN - monitor blood glucose every 1 hour, per Endo tool protocol - Diabetes coordinator following, appreciate input - Resume home insulin regiment once appropriate   Betsey Holiday, AGACNP-BC Acute Care Nurse Practitioner [facility]   [phone] / [phone] Please see Amion for pager details.

## 2021-11-03 DIAGNOSIS — E10649 Type 1 diabetes mellitus with hypoglycemia without coma: Secondary | ICD-10-CM | POA: Diagnosis not present

## 2021-11-03 DIAGNOSIS — N390 Urinary tract infection, site not specified: Secondary | ICD-10-CM

## 2021-11-03 LAB — GLUCOSE, CAPILLARY
Glucose-Capillary: 108 mg/dL — ABNORMAL HIGH (ref 70–99)
Glucose-Capillary: 127 mg/dL — ABNORMAL HIGH (ref 70–99)
Glucose-Capillary: 142 mg/dL — ABNORMAL HIGH (ref 70–99)
Glucose-Capillary: 156 mg/dL — ABNORMAL HIGH (ref 70–99)
Glucose-Capillary: 175 mg/dL — ABNORMAL HIGH (ref 70–99)
Glucose-Capillary: 295 mg/dL — ABNORMAL HIGH (ref 70–99)
Glucose-Capillary: 376 mg/dL — ABNORMAL HIGH (ref 70–99)
Glucose-Capillary: 400 mg/dL — ABNORMAL HIGH (ref 70–99)
Glucose-Capillary: 452 mg/dL — ABNORMAL HIGH (ref 70–99)
Glucose-Capillary: 69 mg/dL — ABNORMAL LOW (ref 70–99)
Glucose-Capillary: 78 mg/dL (ref 70–99)
Glucose-Capillary: 96 mg/dL (ref 70–99)

## 2021-11-03 LAB — BASIC METABOLIC PANEL
Anion gap: 7 (ref 5–15)
BUN: 38 mg/dL — ABNORMAL HIGH (ref 6–20)
CO2: 22 mmol/L (ref 22–32)
Calcium: 8.3 mg/dL — ABNORMAL LOW (ref 8.9–10.3)
Chloride: 110 mmol/L (ref 98–111)
Creatinine, Ser: 1.95 mg/dL — ABNORMAL HIGH (ref 0.44–1.00)
GFR, Estimated: 35 mL/min — ABNORMAL LOW (ref >60)
Glucose, Bld: 79 mg/dL (ref 70–99)
Potassium: 4.9 mmol/L (ref 3.5–5.1)
Sodium: 139 mmol/L (ref 135–145)

## 2021-11-03 LAB — CBC
HCT: 27.7 % — ABNORMAL LOW (ref 36.0–46.0)
Hemoglobin: 9.3 g/dL — ABNORMAL LOW (ref 12.0–15.0)
MCH: 29.3 pg (ref 26.0–34.0)
MCHC: 33.6 g/dL (ref 30.0–36.0)
MCV: 87.4 fL (ref 80.0–100.0)
Platelets: 132 10*3/uL — ABNORMAL LOW (ref 150–400)
RBC: 3.17 MIL/uL — ABNORMAL LOW (ref 3.87–5.11)
RDW: 12.3 % (ref 11.5–15.5)
WBC: 5.5 10*3/uL (ref 4.0–10.5)
nRBC: 0 % (ref 0.0–0.2)

## 2021-11-03 LAB — MAGNESIUM: Magnesium: 2.3 mg/dL (ref 1.7–2.4)

## 2021-11-03 LAB — PHOSPHORUS: Phosphorus: 6.1 mg/dL — ABNORMAL HIGH (ref 2.5–4.6)

## 2021-11-03 LAB — PROCALCITONIN: Procalcitonin: 3.18 ng/mL

## 2021-11-03 LAB — T3: T3, Total: 90 ng/dL (ref 71–180)

## 2021-11-03 MED ORDER — INSULIN DETEMIR 100 UNIT/ML ~~LOC~~ SOLN
4.0000 [IU] | Freq: Once | SUBCUTANEOUS | Status: DC
  Filled 2021-11-03: qty 0.04

## 2021-11-03 MED ORDER — INSULIN ASPART 100 UNIT/ML IJ SOLN
0.0000 [IU] | INTRAMUSCULAR | Status: DC
  Administered 2021-11-03: 6 [IU] via SUBCUTANEOUS
  Administered 2021-11-03: 5 [IU] via SUBCUTANEOUS
  Administered 2021-11-03: 3 [IU] via SUBCUTANEOUS
  Administered 2021-11-04: 2 [IU] via SUBCUTANEOUS
  Administered 2021-11-04: 4 [IU] via SUBCUTANEOUS
  Administered 2021-11-04: 3 [IU] via SUBCUTANEOUS
  Administered 2021-11-04: 5 [IU] via SUBCUTANEOUS
  Administered 2021-11-05: 6 [IU] via SUBCUTANEOUS
  Administered 2021-11-05 – 2021-11-06 (×2): 1 [IU] via SUBCUTANEOUS
  Administered 2021-11-06: 3 [IU] via SUBCUTANEOUS
  Filled 2021-11-03 (×11): qty 1

## 2021-11-03 MED ORDER — INSULIN DETEMIR 100 UNIT/ML ~~LOC~~ SOLN
4.0000 [IU] | Freq: Two times a day (BID) | SUBCUTANEOUS | Status: DC
  Administered 2021-11-03 – 2021-11-06 (×7): 4 [IU] via SUBCUTANEOUS
  Filled 2021-11-03 (×8): qty 0.04

## 2021-11-03 MED ORDER — INSULIN ASPART 100 UNIT/ML IJ SOLN: 3.0000 [IU] | Freq: Three times a day (TID) | INTRAMUSCULAR | Status: DC

## 2021-11-03 MED ORDER — INSULIN ASPART 100 UNIT/ML IJ SOLN
3.0000 [IU] | Freq: Three times a day (TID) | INTRAMUSCULAR | Status: DC
  Administered 2021-11-03 – 2021-11-06 (×6): 3 [IU] via SUBCUTANEOUS
  Filled 2021-11-03 (×6): qty 1

## 2021-11-03 MED ORDER — SODIUM CHLORIDE 0.9 % IV SOLN
1.0000 g | INTRAVENOUS | Status: DC
  Administered 2021-11-03 – 2021-11-05 (×3): 1 g via INTRAVENOUS
  Filled 2021-11-03: qty 10
  Filled 2021-11-03: qty 1
  Filled 2021-11-03: qty 10
  Filled 2021-11-03 (×2): qty 1

## 2021-11-03 NOTE — Progress Notes (Addendum)
PROGRESS NOTE    Ashley Camacho  [id number] DOB: Sep 28, 1990 DOA: 11/02/2021 PCP: Merryl Hacker, No    Brief Narrative:  Ashley Camacho is a 31 y.o. 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  On arrival of EMS blood sugar was in the 20s.  They were unable to obtain IV access and administered glucagon with improvement in glucose to the 40s.  She was lethargic on arrival to the ED whereupon IV access was established and she received an amp of D50 with return of full consciousness.  Patient initially remained stable in the ED and was eating a meal and blood sugar went up to 132 however she again became lethargic and blood sugar rechecked was 30.  She was a administered another amp of D50 and hospitalist consulted for admission. Patient denies recent change in insulin dose, denies poor oral intake, nausea vomiting or diarrhea.  Denies cough, fever or chills.  States that she was in her usual state of health.  Initial labs with glucose 40, creatinine 1.94 which is at baseline, WBC 3.9, hemoglobin 9.6 and platelets 103000.  PCCM was consulted on admission for severe hypoglycemia and suspected adrenal insuffi. Admitted to SDU..  Consultants:  PCCM  Procedures:   Antimicrobials:     Subjective: Feels better. Eating breakfast. No sob, dizziness, cp  Objective: Vitals:   11/03/21 1000 11/03/21 1200 11/03/21 1400 11/03/21 1600  BP: 108/70 102/68 115/80 112/80  Pulse: 90 91 96 93  Resp: 17 14 13 17   Temp:  98 F (36.7 C)  97.8 F (36.6 C)  TempSrc:  Oral  Oral  SpO2: 100% 97% 98% 99%  Weight:      Height:        Intake/Output Summary (Last 24 hours) at 11/03/2021  2000 Last data filed at 11/03/2021 0945 Gross per 24 hour  Intake 251.56 ml  Output 2250 ml  Net -1998.44 ml   Filed Weights   11/02/21 0014  Weight: 51 kg    Examination: Calm, NAD Cta no w/r Reg s1/s2 no gallop Soft benign +bs No edema Aaoxox3  Mood and affect appropriate in current setting     Data Reviewed: I have personally reviewed following labs and imaging studies  CBC: Recent Labs  Lab 11/02/21 0019 11/03/21 0410  WBC 3.9* 5.5  NEUTROABS 2.0  --   HGB 9.6* 9.3*  HCT 30.3* 27.7*  MCV 92.9 87.4  PLT 103* [id number]*   Basic Metabolic Panel: Recent Labs  Lab 11/02/21 0019 11/02/21 0639 11/02/21 2255 11/03/21 0410  NA 140  --  137 139  K 4.0  --  4.4 4.9  CL 114*  --  107 110  CO2 23  --  23 22  GLUCOSE 40*  --  133* 79  BUN 36*  --  37* 38*  CREATININE 1.94*  --  1.94* 1.95*  CALCIUM 9.0  --  8.5* 8.3*  MG  --  1.6* 2.3 2.3  PHOS  --  <1.0* 5.6* 6.1*   GFR: Estimated Creatinine Clearance: 31.8 mL/min (A) (by C-G formula based on SCr of 1.95 mg/dL (H)). Liver Function Tests: Recent Labs  Lab 11/02/21 1126  AST 32  ALT 21  ALKPHOS 69  BILITOT  0.6  PROT 6.6  ALBUMIN 3.3*   No results for input(s): "LIPASE", "AMYLASE" in the last 168 hours. No results for input(s): "AMMONIA" in the last 168 hours. Coagulation Profile: No results for input(s): "INR", "PROTIME" in the last 168 hours. Cardiac Enzymes: No results for input(s): "CKTOTAL", "CKMB", "CKMBINDEX", "TROPONINI" in the last 168 hours. BNP (last 3 results) No results for input(s): "PROBNP" in the last 8760 hours. HbA1C: Recent Labs    11/02/21 0019  HGBA1C 8.4*   CBG: Recent Labs  Lab 11/03/21 0711 11/03/21 0951 11/03/21 1119 11/03/21 1620 11/03/21 1940  GLUCAP 142* 156* 295* 376* 452*   Lipid Profile: No results for input(s): "CHOL", "HDL", "LDLCALC", "TRIG", "CHOLHDL", "LDLDIRECT" in the last 72 hours. Thyroid Function Tests: Recent Labs    11/02/21 0019 11/02/21 0639   TSH 4.873*  --   FREET4  --  0.59*   Anemia Panel: No results for input(s): "VITAMINB12", "FOLATE", "FERRITIN", "TIBC", "IRON", "RETICCTPCT" in the last 72 hours. Sepsis Labs: Recent Labs  Lab 11/02/21 0639 11/03/21 0410  PROCALCITON 0.85 3.18    Recent Results (from the past 240 hour(s))  MRSA Next Gen by PCR, Nasal     Status: None   Collection Time: 11/02/21  8:00 AM   Specimen: Nasal Mucosa; Nasal Swab  Result Value Ref Range Status   MRSA by PCR Next Gen NOT DETECTED NOT DETECTED Final    Comment: (NOTE) The GeneXpert MRSA Assay (FDA approved for NASAL specimens only), is one component of a comprehensive MRSA colonization surveillance program. It is not intended to diagnose MRSA infection nor to guide or monitor treatment for MRSA infections. Test performance is not FDA approved in patients less than 58 years old. Performed at [facility], [address] [place], [place] [address]          Radiology Studies: DG Chest Portable 1 View  Result Date: 11/02/2021 CLINICAL DATA:  Hypoglycemia. EXAM: PORTABLE CHEST 1 VIEW COMPARISON:  Chest radiograph dated 08/22/2021. FINDINGS: The heart size and mediastinal contours are within normal limits. Both lungs are clear. The visualized skeletal structures are unremarkable. IMPRESSION: No active disease. Electronically Signed   By: Anner Crete M.D.   On: 11/02/2021 02:58        Scheduled Meds:  Chlorhexidine Gluconate Cloth  6 each Topical Daily   enoxaparin (LOVENOX) injection  40 mg Subcutaneous Q24H   insulin aspart  0-6 Units Subcutaneous Q4H   insulin aspart  3 Units Subcutaneous TID WC   insulin detemir  4 Units Subcutaneous BID   levothyroxine  25 mcg Oral Q0600   methylPREDNISolone (SOLU-MEDROL) injection  20 mg Intravenous BID   mirtazapine  30 mg Oral QHS   pantoprazole  40 mg Oral Daily   Continuous Infusions:  cefTRIAXone (ROCEPHIN)  IV      Assessment & Plan:   Principal Problem:    Hypoglycemia due to type 1 diabetes mellitus (HCC) Active Problems:   Adrenal insufficiency (HCC)   Depression   Hypothyroidism   HFrEF (heart failure with reduced ejection fraction) (HCC)   Chronic kidney disease, stage 3b (HCC)   Thrombocytopenia (HCC)   UTI (urinary tract infection)   Hypoglycemia due to type 1 diabetes mellitus ([facility]) Acute metabolic encephalopathy secondary to hypoglycemia Received 2 A of D50 in the ED, glucagon in the field 6/13Received D10 infusion BG improved, now elevated.  More likely from steroids.  Was given cortif too. Add novolog to 3units with meals Continue lantus  Riss MS  has improved.    UTI +UA Procal up No leukocytosis. Not sure if ucx sent Start ceftriaxone   Adrenal insufficiency [facility]) Patient denies missing doses of oral steroids PCCM was consulted Cortisol level pending Was given cortif. On iv steroids now, will transition to home meds in am   Thrombocytopenia (HCC) Chronic, stable. Improved from yesterday No bleed Mono negative. Hiv nonreactive    Chronic kidney disease, stage 3b (HCC) At baseline Avoid nephrotoxic meds   HFrEF (heart failure with reduced ejection fraction) (HCC) Nonischemic cardiomyopathy Compensated Beta bl and diuretics on hold. Monitor I/o , daily wt   Hypothyroidism  patient does admit to running out of Synthroid and has been able to get it refilled 6/13 TSH high, FT4 low.    Depression Continue home Remeron   DVT prophylaxis: lovenox Code Status:full Family Communication: none at bedside Disposition Plan:  Status is: Inpatient Remains inpatient appropriate because: IV treatment. BG needs better control        LOS: 1 day   Time spent: 55 min    Nolberto Hanlon, MD [facility] Pager [phone]  If 7PM-7AM, please contact night-coverage 11/03/2021, 8:00 PM

## 2021-11-03 NOTE — Progress Notes (Signed)
Spoke with Micheline Maze NP and informed her that pt fsbs is 127. Endo tool instructed to restart drip at 0.5. Instructed by Micheline Maze NP to keep the drip off for now and recheck in 1hr. Read back and verified.

## 2021-11-03 NOTE — TOC Initial Note (Signed)
Transition of Care [facility]) - Initial/Assessment Note    Patient Details  Name: Ashley Camacho MRN: [id number] Date of Birth: April 17, 1991  Transition of [facility]) CM/SW Contact:    Shelbie Hutching, RN Phone Number: 11/03/2021, 10:39 AM  Clinical Narrative:                 RNCM met with patient at the bedside, introduced self and explained role.  High risk readmission assessment completed. Patient reports that she lives in [place] with her children's father, they are not together any longer, he provides her transportation to appointments.  Her PCP is in [place] where she was living until just a couple of months ago.  Her Endocrinologist is at [facility], she reports that they are going to get her set up with an insulin pump, she reports that she has gotten the pump but they have to do the education with her before she starts it.  Patient denies any needs or concerns.  Her children's father will pick her up at discharge.    Expected Discharge Plan: Home/Self Care Barriers to Discharge: Continued Medical Work up   Patient Goals and CMS Choice Patient states their goals for this hospitalization and ongoing recovery are:: to get back home- expresses no needs      Expected Discharge Plan and Services Expected Discharge Plan: Home/Self Care   Discharge Planning Services: CM Consult   Living arrangements for the past 2 months: Single Family Home                 DME Arranged: N/A DME Agency: NA       HH Arranged: NA HH Agency: NA        Prior Living Arrangements/Services Living arrangements for the past 2 months: Single Family Home Lives with:: Other (Comment), Minor Children Patient language and need for interpreter reviewed:: Yes Do you feel safe going back to the place where you live?: Yes      Need for Family Participation in Patient Care: Yes (Comment) Care giver support system in place?: Yes (comment)   Criminal Activity/Legal Involvement Pertinent to Current  Situation/Hospitalization: No - Comment as needed  Activities of Daily Living Home Assistive Devices/Equipment: None ADL Screening (condition at time of admission) Patient's cognitive ability adequate to safely complete daily activities?: Yes Is the patient deaf or have difficulty hearing?: No Does the patient have difficulty seeing, even when wearing glasses/contacts?: No Does the patient have difficulty concentrating, remembering, or making decisions?: No Patient able to express need for assistance with ADLs?: Yes Does the patient have difficulty dressing or bathing?: No Independently performs ADLs?: Yes (appropriate for developmental age) Does the patient have difficulty walking or climbing stairs?: No Weakness of Legs: None Weakness of Arms/Hands: None  Permission Sought/Granted   Permission granted to share information with : No              Emotional Assessment Appearance:: Appears younger than stated age Attitude/Demeanor/Rapport: Engaged Affect (typically observed): Accepting, Quiet Orientation: : Oriented to Self, Oriented to Place, Oriented to  Time, Oriented to Situation Alcohol / Substance Use: Not Applicable Psych Involvement: No (comment)  Admission diagnosis:  Prolonged Q-T interval on ECG [R94.31] Hypoglycemia due to type 1 diabetes mellitus ([place]) [E10.649] Patient Active Problem List   Diagnosis Date Noted   Hypoglycemia due to type 1 diabetes mellitus ([place]) 11/02/2021   Chronic kidney disease, stage 3b ([place]) 11/02/2021   Thrombocytopenia ([place]) 11/02/2021   Hypoglycemia due to insulin 06/14/2021  DKA (diabetic ketoacidosis) ([place]) 06/14/2021   CKD stage 3 due to type 1 diabetes mellitus ([place]) 06/14/2021   Transaminitis 06/14/2021   Hypoglycemia associated with diabetes ([place]) 06/03/2021   Adrenal insufficiency ([place]) 06/01/2021   DKA, type 1 ([place]) 05/20/2021   Moderate recurrent major depression ([place]) 05/08/2021   Right arm pain 05/07/2021   Rash of hand  05/07/2021   Hyperkalemia 05/06/2021   History of adrenal insufficiency 05/06/2021   CAP (community acquired pneumonia) 05/05/2021   Hypoglycemia 04/27/2021   Postoperative hematoma involving genitourinary system following genitourinary procedure    S/P cesarean section 02/17/2021   Acute esophagitis    Leg edema    History of preterm delivery, currently pregnant    Pre-existing type 1 diabetes mellitus during pregnancy in third trimester    Anemia during pregnancy    Type 1 diabetes mellitus affecting pregnancy in second trimester, antepartum    HFrEF (heart failure with reduced ejection fraction) ([place])    Acute deep vein thrombosis (DVT) of brachial vein of right upper extremity ([place]) 12/17/2020   Anxiety 12/17/2020   Diabetic retinopathy ([place]) 12/17/2020   Acute on chronic combined systolic and diastolic CHF (congestive heart failure) ([place])    Hyperemesis 12/13/2020   Type 1 diabetes mellitus with hypoglycemia without coma ([place]) 12/13/2020   Sunburn 12/13/2020   Hypothyroidism 12/10/2020   Malnutrition ([place]) 12/10/2020   Vitreous hemorrhage of right eye ([place]) 12/10/2020   Pleural effusion associated with pulmonary infection 11/25/2020   Pneumonia affecting pregnancy 11/24/2020   Diabetes mellitus affecting pregnancy, second trimester 11/24/2020   Edema 11/24/2020   Decreased urine output 11/24/2020   Shortness of breath    Zinc deficiency    SOB (shortness of breath)    Social problem 11/21/2020   Nausea and vomiting during pregnancy prior to [redacted] weeks gestation 11/21/2020   Low serum vitamin B12    Delivery with history of C-section 11/20/2020   Absolute anemia    Acute kidney injury ([place])    Nausea and vomiting during pregnancy 10/23/2020   History of premature delivery, currently pregnant, second trimester 10/13/2020   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   Depression 09/28/2018   Peripheral neuropathy 09/28/2018   Diabetic gastroparesis ([place]) 04/22/2016   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[place]) 04/19/2016   DKA (diabetic ketoacidoses) 04/03/2016   Microalbuminuria 09/26/2005   PCP:  Pcp, No Pharmacy:   [id number]: [phone]: [phone] [id number]: [phone]: [phone]    Social Determinants of Health (SDOH) Interventions    Readmission Risk Interventions    11/03/2021   10:37 AM 06/16/2021   12:30 PM 06/03/2021    4:26 PM  Readmission Risk Prevention Plan  Transportation Screening Complete Complete Complete  Medication Review (RN Care Manager) Complete Complete Complete  PCP or Specialist appointment within 3-5 days of discharge Complete Complete Complete  HRI or Home Care Consult Not Complete  Not Complete  HRI or Home Care Consult Pt Refusal Comments NA  NA  SW Recovery Care/Counseling Consult Not Complete Complete Complete  SW Consult Not Complete Comments NA    Palliative Care Screening Not Applicable Not Applicable Not Applicable  Skilled Nursing Facility Not Applicable Not Applicable Not Applicable

## 2021-11-04 DIAGNOSIS — N1832 Chronic kidney disease, stage 3b: Secondary | ICD-10-CM

## 2021-11-04 DIAGNOSIS — E2740 Unspecified adrenocortical insufficiency: Secondary | ICD-10-CM

## 2021-11-04 DIAGNOSIS — E10649 Type 1 diabetes mellitus with hypoglycemia without coma: Secondary | ICD-10-CM | POA: Diagnosis not present

## 2021-11-04 LAB — BASIC METABOLIC PANEL
Anion gap: 8 (ref 5–15)
BUN: 51 mg/dL — ABNORMAL HIGH (ref 6–20)
CO2: 24 mmol/L (ref 22–32)
Calcium: 8.1 mg/dL — ABNORMAL LOW (ref 8.9–10.3)
Chloride: 111 mmol/L (ref 98–111)
Creatinine, Ser: 2.07 mg/dL — ABNORMAL HIGH (ref 0.44–1.00)
GFR, Estimated: 32 mL/min — ABNORMAL LOW (ref >60)
Glucose, Bld: 94 mg/dL (ref 70–99)
Potassium: 5 mmol/L (ref 3.5–5.1)
Sodium: 143 mmol/L (ref 135–145)

## 2021-11-04 LAB — CBC
HCT: 27.3 % — ABNORMAL LOW (ref 36.0–46.0)
Hemoglobin: 9 g/dL — ABNORMAL LOW (ref 12.0–15.0)
MCH: 30.1 pg (ref 26.0–34.0)
MCHC: 33 g/dL (ref 30.0–36.0)
MCV: 91.3 fL (ref 80.0–100.0)
Platelets: 140 10*3/uL — ABNORMAL LOW (ref 150–400)
RBC: 2.99 MIL/uL — ABNORMAL LOW (ref 3.87–5.11)
RDW: 12.7 % (ref 11.5–15.5)
WBC: 6 10*3/uL (ref 4.0–10.5)
nRBC: 0 % (ref 0.0–0.2)

## 2021-11-04 LAB — GLUCOSE, CAPILLARY
Glucose-Capillary: 167 mg/dL — ABNORMAL HIGH (ref 70–99)
Glucose-Capillary: 213 mg/dL — ABNORMAL HIGH (ref 70–99)
Glucose-Capillary: 264 mg/dL — ABNORMAL HIGH (ref 70–99)
Glucose-Capillary: 349 mg/dL — ABNORMAL HIGH (ref 70–99)
Glucose-Capillary: 359 mg/dL — ABNORMAL HIGH (ref 70–99)
Glucose-Capillary: 37 mg/dL — CL (ref 70–99)
Glucose-Capillary: 40 mg/dL — CL (ref 70–99)
Glucose-Capillary: 92 mg/dL (ref 70–99)
Glucose-Capillary: 99 mg/dL (ref 70–99)

## 2021-11-04 LAB — PROCALCITONIN: Procalcitonin: 2.44 ng/mL

## 2021-11-04 MED ORDER — METHYLPREDNISOLONE SODIUM SUCC 40 MG IJ SOLR
40.0000 mg | Freq: Every day | INTRAMUSCULAR | Status: DC
Start: 2021-11-05 — End: 2021-11-04

## 2021-11-04 MED ORDER — HYDROCORTISONE 10 MG PO TABS
10.0000 mg | ORAL_TABLET | ORAL | Status: DC
  Administered 2021-11-06: 10 mg via ORAL
  Filled 2021-11-04 (×2): qty 1

## 2021-11-04 MED ORDER — SODIUM CHLORIDE 0.9 % IV SOLN: INTRAVENOUS | Status: DC | PRN

## 2021-11-04 MED ORDER — HYDROCORTISONE 5 MG PO TABS
5.0000 mg | ORAL_TABLET | Freq: Every day | ORAL | Status: DC
  Administered 2021-11-04 – 2021-11-05 (×2): 5 mg via ORAL
  Filled 2021-11-04 (×4): qty 1

## 2021-11-04 NOTE — Progress Notes (Signed)
PHARMACIST - PHYSICIAN COMMUNICATION   CONCERNING: Methylprednisolone IV    Current order: Methylprednisolone IV 20 mg BID     DESCRIPTION: Per [facility] Protocol:   IV methylprednisolone will be converted to either a q12h or q24h frequency with the same total daily dose (TDD).  Ordered Dose: 1 to 125 mg TDD; convert to: TDD q24h.  Ordered Dose: 126 to 250 mg TDD; convert to: TDD div q12h.  Ordered Dose: >250 mg TDD; DAW.  Order has been adjusted to: Methylprednisolone IV 40 mg daily  Tressie Ellis  11/04/2021 11:28 AM

## 2021-11-04 NOTE — Progress Notes (Signed)
PROGRESS NOTE    Ashley Camacho  VVK:[id number] DOB: Dec 25, 1990 DOA: 11/02/2021 PCP: Pcp, No    Assessment & Plan:   Principal Problem:   Hypoglycemia due to type 1 diabetes mellitus (HCC) Active Problems:   Adrenal insufficiency (HCC)   Depression   Hypothyroidism   HFrEF (heart failure with reduced ejection fraction) (HCC)   Chronic kidney disease, stage 3b (HCC)   Thrombocytopenia (HCC)   UTI (urinary tract infection)  Assessment and Plan: Hypoglycemia: secondary to poorly controlled DM1. Still w/ hypoglycemic episodes today, 37 was the lowest so far. Continue on levemir, aspart & SSI w/ accuchecks. Received D50 in ED & glucagon in the field  DM1: poorly controlled. Still w/ hypoglycemic episodes. Continue on levemir, aspart, SSI w/ accuchecks. DM coordinator consulted   Acute metabolic encephalopathy: secondary to hypoglycemia. Resolved    UTI: no urine cx sent. Will give abxs x 3 days    Adrenal insufficiency: d/c IV steroids and start home dose of po steroids    Thrombocytopenia: chronic. Labile    CKDIIIb: Cr is trending up from day prior. Avoid nephrotoxic meds   Anemia of chronic disease: likely secondary to CKD. No need for a transfusion currently    Chronic systolic CHF: w/ hx of nonischemic cardiomyopathy. Appears compensated. Monitor I/Os. Holding lasix, coreg    Hypothyroidism: continue on levothyroxine. Admitted to running out of levothyroxine   Depression: severity unknown. Continue on home dose of remeron       DVT prophylaxis: lovenox  Code Status:  full  Family Communication:  Disposition Plan: likely d/c back home   Level of care: Stepdown  Status is: Inpatient Remains inpatient appropriate because: severity of illness    Consultants:    Procedures:   Antimicrobials: rocephin    Subjective: Pt c/o malaise   Objective: Vitals:   11/04/21 0300 11/04/21 0400 11/04/21 0500 11/04/21 0800  BP:  113/79  109/72  Pulse: 93 92 89  93  Resp: 15 14 17  (!) 22  Temp:    97.9 F (36.6 C)  TempSrc:    Oral  SpO2: 100% 100% 99% 100%  Weight:      Height:        Intake/Output Summary (Last 24 hours) at 11/04/2021 0909 Last data filed at 11/04/2021 0600 Gross per 24 hour  Intake 100 ml  Output 900 ml  Net -800 ml   Filed Weights   11/02/21 0014  Weight: 51 kg    Examination:  General exam: Appears calm and comfortable  Respiratory system: Clear to auscultation. Respiratory effort normal. Cardiovascular system: S1 & S2 +. No rubs, gallops or clicks.  Gastrointestinal system: Abdomen is nondistended, soft and nontender. Normal bowel sounds heard. Central nervous system: Alert and oriented. Moves all extremities  Psychiatry: Judgement and insight appear normal. Flat mood and affect     Data Reviewed: I have personally reviewed following labs and imaging studies  CBC: Recent Labs  Lab 11/02/21 0019 11/03/21 0410 11/04/21 0359  WBC 3.9* 5.5 6.0  NEUTROABS 2.0  --   --   HGB 9.6* 9.3* 9.0*  HCT 30.3* 27.7* 27.3*  MCV 92.9 87.4 91.3  PLT 103* 132* 140*   Basic Metabolic Panel: Recent Labs  Lab 11/02/21 0019 11/02/21 0639 11/02/21 2255 11/03/21 0410 11/04/21 0359  NA 140  --  137 139 143  K 4.0  --  4.4 4.9 5.0  CL 114*  --  107 110 111  CO2 23  --  23  22 24  GLUCOSE 40*  --  133* 79 94  BUN 36*  --  37* 38* 51*  CREATININE 1.94*  --  1.94* 1.95* 2.07*  CALCIUM 9.0  --  8.5* 8.3* 8.1*  MG  --  1.6* 2.3 2.3  --   PHOS  --  <1.0* 5.6* 6.1*  --    GFR: Estimated Creatinine Clearance: 30 mL/min (A) (by C-G formula based on SCr of 2.07 mg/dL (H)). Liver Function Tests: Recent Labs  Lab 11/02/21 1126  AST 32  ALT 21  ALKPHOS 69  BILITOT 0.6  PROT 6.6  ALBUMIN 3.3*   No results for input(s): "LIPASE", "AMYLASE" in the last 168 hours. No results for input(s): "AMMONIA" in the last 168 hours. Coagulation Profile: No results for input(s): "INR", "PROTIME" in the last 168 hours. Cardiac  Enzymes: No results for input(s): "CKTOTAL", "CKMB", "CKMBINDEX", "TROPONINI" in the last 168 hours. BNP (last 3 results) No results for input(s): "PROBNP" in the last 8760 hours. HbA1C: Recent Labs    11/02/21 0019  HGBA1C 8.4*   CBG: Recent Labs  Lab 11/04/21 0002 11/04/21 0319 11/04/21 0724 11/04/21 0726 11/04/21 0903  GLUCAP 213* 92 40* 37* 167*   Lipid Profile: No results for input(s): "CHOL", "HDL", "LDLCALC", "TRIG", "CHOLHDL", "LDLDIRECT" in the last 72 hours. Thyroid Function Tests: Recent Labs    11/02/21 0019 11/02/21 0639  TSH 4.873*  --   FREET4  --  0.59*   Anemia Panel: No results for input(s): "VITAMINB12", "FOLATE", "FERRITIN", "TIBC", "IRON", "RETICCTPCT" in the last 72 hours. Sepsis Labs: Recent Labs  Lab 11/02/21 0639 11/03/21 0410 11/04/21 0359  PROCALCITON 0.85 3.18 2.44    Recent Results (from the past 240 hour(s))  MRSA Next Gen by PCR, Nasal     Status: None   Collection Time: 11/02/21  8:00 AM   Specimen: Nasal Mucosa; Nasal Swab  Result Value Ref Range Status   MRSA by PCR Next Gen NOT DETECTED NOT DETECTED Final    Comment: (NOTE) The GeneXpert MRSA Assay (FDA approved for NASAL specimens only), is one component of a comprehensive MRSA colonization surveillance program. It is not intended to diagnose MRSA infection nor to guide or monitor treatment for MRSA infections. Test performance is not FDA approved in patients less than 79 years old. Performed at [facility], [address] [place], [place] [address]          Radiology Studies: No results found.      Scheduled Meds:  Chlorhexidine Gluconate Cloth  6 each Topical Daily   enoxaparin (LOVENOX) injection  40 mg Subcutaneous Q24H   insulin aspart  0-6 Units Subcutaneous Q4H   insulin aspart  3 Units Subcutaneous TID WC   insulin detemir  4 Units Subcutaneous BID   levothyroxine  25 mcg Oral Q0600   methylPREDNISolone (SOLU-MEDROL) injection  20 mg  Intravenous BID   mirtazapine  30 mg Oral QHS   pantoprazole  40 mg Oral Daily   Continuous Infusions:  cefTRIAXone (ROCEPHIN)  IV Stopped (11/03/21 2216)     LOS: 2 days    Time spent: 33 mins     Wyvonnia Dusky, MD [facility] Pager [phone]  If 7PM-7AM, please contact night-coverage 11/04/2021, 9:09 AM

## 2021-11-05 DIAGNOSIS — E1069 Type 1 diabetes mellitus with other specified complication: Secondary | ICD-10-CM

## 2021-11-05 DIAGNOSIS — E2740 Unspecified adrenocortical insufficiency: Secondary | ICD-10-CM | POA: Diagnosis not present

## 2021-11-05 DIAGNOSIS — N1832 Chronic kidney disease, stage 3b: Secondary | ICD-10-CM | POA: Diagnosis not present

## 2021-11-05 DIAGNOSIS — E10649 Type 1 diabetes mellitus with hypoglycemia without coma: Secondary | ICD-10-CM | POA: Diagnosis not present

## 2021-11-05 DIAGNOSIS — E875 Hyperkalemia: Secondary | ICD-10-CM

## 2021-11-05 LAB — BASIC METABOLIC PANEL
Anion gap: 4 — ABNORMAL LOW (ref 5–15)
BUN: 59 mg/dL — ABNORMAL HIGH (ref 6–20)
CO2: 24 mmol/L (ref 22–32)
Calcium: 8.2 mg/dL — ABNORMAL LOW (ref 8.9–10.3)
Chloride: 112 mmol/L — ABNORMAL HIGH (ref 98–111)
Creatinine, Ser: 2.12 mg/dL — ABNORMAL HIGH (ref 0.44–1.00)
GFR, Estimated: 32 mL/min — ABNORMAL LOW (ref >60)
Glucose, Bld: 103 mg/dL — ABNORMAL HIGH (ref 70–99)
Potassium: 5.5 mmol/L — ABNORMAL HIGH (ref 3.5–5.1)
Sodium: 140 mmol/L (ref 135–145)

## 2021-11-05 LAB — CBC
HCT: 27.8 % — ABNORMAL LOW (ref 36.0–46.0)
Hemoglobin: 8.9 g/dL — ABNORMAL LOW (ref 12.0–15.0)
MCH: 29.9 pg (ref 26.0–34.0)
MCHC: 32 g/dL (ref 30.0–36.0)
MCV: 93.3 fL (ref 80.0–100.0)
Platelets: 136 10*3/uL — ABNORMAL LOW (ref 150–400)
RBC: 2.98 MIL/uL — ABNORMAL LOW (ref 3.87–5.11)
RDW: 12.9 % (ref 11.5–15.5)
WBC: 8.4 10*3/uL (ref 4.0–10.5)
nRBC: 0 % (ref 0.0–0.2)

## 2021-11-05 LAB — GLUCOSE, CAPILLARY
Glucose-Capillary: 99 mg/dL (ref 70–99)
Glucose-Capillary: 90 mg/dL (ref 70–99)
Glucose-Capillary: 401 mg/dL — ABNORMAL HIGH (ref 70–99)
Glucose-Capillary: 137 mg/dL — ABNORMAL HIGH (ref 70–99)
Glucose-Capillary: 196 mg/dL — ABNORMAL HIGH (ref 70–99)

## 2021-11-05 LAB — POTASSIUM
Potassium: 5.4 mmol/L — ABNORMAL HIGH (ref 3.5–5.1)
Potassium: 5.3 mmol/L — ABNORMAL HIGH (ref 3.5–5.1)

## 2021-11-05 MED ORDER — SODIUM ZIRCONIUM CYCLOSILICATE 10 G PO PACK
10.0000 g | PACK | Freq: Once | ORAL | Status: AC
  Administered 2021-11-05: 10 g via ORAL
  Filled 2021-11-05: qty 1

## 2021-11-05 NOTE — Progress Notes (Signed)
PROGRESS NOTE    Ashley Camacho  BOF:[id number] DOB: 18-Jul-1990 DOA: 11/02/2021 PCP: Pcp, No    Assessment & Plan:   Principal Problem:   Hypoglycemia due to type 1 diabetes mellitus (HCC) Active Problems:   Adrenal insufficiency (HCC)   Depression   Hypothyroidism   HFrEF (heart failure with reduced ejection fraction) (HCC)   Chronic kidney disease, stage 3b (HCC)   Thrombocytopenia (HCC)   UTI (urinary tract infection)  Assessment and Plan: Hypoglycemia: secondary to poorly controlled DM1. Still w/ hypoglycemic episodes today, 37 was the lowest so far. Continue on levemir, aspart & SSI w/ accuchecks. Received D50 in ED & glucagon in the field  DM1: poorly controlled. No hypoglycemic episodes today so far. Continue on levemir, aspart, SSI w/ accuchecks   Hyperkalemia: likely secondary to AKI on CKD. Lokelma x 1. Repeat potassium level ordered   Acute metabolic encephalopathy: resolved    UTI: no urine cx sent. Abxs x 3 days    Adrenal insufficiency: continue on home dose of po steroids    Thrombocytopenia: chronic. Labile     AKI on CKDIIIb: Cr is labile. Avoid nephrotoxic meds    Anemia of chronic disease: likely secondary to CKD. No need for a transfusion currently    Chronic systolic CHF: w/ hx of nonischemic cardiomyopathy. Appears euvolemic. Monitor I/Os. Holding coreg, lasix    Hypothyroidism: continue on levothyroxine. Admitted to running out of levothyroxine   Depression: severity unknown. Continue on home dose of remeron       DVT prophylaxis: lovenox  Code Status:  full  Family Communication:  Disposition Plan: likely d/c back home   Level of care: Med-Surg  Status is: Inpatient Remains inpatient appropriate because: severity of illness    Consultants:    Procedures:   Antimicrobials: rocephin    Subjective: Pt c/o fatigue   Objective: Vitals:   11/04/21 1200 11/04/21 1629 11/04/21 1939 11/05/21 0354  BP: 125/87 115/79 113/79  109/75  Pulse: 97 87 90 82  Resp: 20  20 20   Temp:  97.6 F (36.4 C) 98.2 F (36.8 C) 98.2 F (36.8 C)  TempSrc:  Oral Oral Oral  SpO2: 99% 100% 100% 100%  Weight:      Height:        Intake/Output Summary (Last 24 hours) at 11/05/2021 0729 Last data filed at 11/05/2021 0516 Gross per 24 hour  Intake 344.8 ml  Output 600 ml  Net -255.2 ml   Filed Weights   11/02/21 0014  Weight: 51 kg    Examination:  General exam: Appears comfortable  Respiratory system: clear breath sounds b/l  Cardiovascular system: S1/S2+. No rubs or clicks  Gastrointestinal system: Abd is soft, ND, NT & normal bowel sounds  Central nervous system: alert and oriented. Moves all extremities  Psychiatry: Judgement and insight is at baseline. Flat mood and affect     Data Reviewed: I have personally reviewed following labs and imaging studies  CBC: Recent Labs  Lab 11/02/21 0019 11/03/21 0410 11/04/21 0359 11/05/21 0407  WBC 3.9* 5.5 6.0 8.4  NEUTROABS 2.0  --   --   --   HGB 9.6* 9.3* 9.0* 8.9*  HCT 30.3* 27.7* 27.3* 27.8*  MCV 92.9 87.4 91.3 93.3  PLT 103* 132* 140* 136*   Basic Metabolic Panel: Recent Labs  Lab 11/02/21 0019 11/02/21 0639 11/02/21 2255 11/03/21 0410 11/04/21 0359 11/05/21 0407  NA 140  --  137 139 143 140  K 4.0  --  4.4 4.9 5.0 5.5*  CL 114*  --  107 110 111 112*  CO2 23  --  23 22 24 24   GLUCOSE 40*  --  133* 79 94 103*  BUN 36*  --  37* 38* 51* 59*  CREATININE 1.94*  --  1.94* 1.95* 2.07* 2.12*  CALCIUM 9.0  --  8.5* 8.3* 8.1* 8.2*  MG  --  1.6* 2.3 2.3  --   --   PHOS  --  <1.0* 5.6* 6.1*  --   --    GFR: Estimated Creatinine Clearance: 29.3 mL/min (A) (by C-G formula based on SCr of 2.12 mg/dL (H)). Liver Function Tests: Recent Labs  Lab 11/02/21 1126  AST 32  ALT 21  ALKPHOS 69  BILITOT 0.6  PROT 6.6  ALBUMIN 3.3*   No results for input(s): "LIPASE", "AMYLASE" in the last 168 hours. No results for input(s): "AMMONIA" in the last 168  hours. Coagulation Profile: No results for input(s): "INR", "PROTIME" in the last 168 hours. Cardiac Enzymes: No results for input(s): "CKTOTAL", "CKMB", "CKMBINDEX", "TROPONINI" in the last 168 hours. BNP (last 3 results) No results for input(s): "PROBNP" in the last 8760 hours. HbA1C: No results for input(s): "HGBA1C" in the last 72 hours.  CBG: Recent Labs  Lab 11/04/21 1135 11/04/21 1544 11/04/21 1941 11/04/21 2345 11/05/21 0352  GLUCAP 349* 359* 264* 99 99   Lipid Profile: No results for input(s): "CHOL", "HDL", "LDLCALC", "TRIG", "CHOLHDL", "LDLDIRECT" in the last 72 hours. Thyroid Function Tests: No results for input(s): "TSH", "T4TOTAL", "FREET4", "T3FREE", "THYROIDAB" in the last 72 hours.  Anemia Panel: No results for input(s): "VITAMINB12", "FOLATE", "FERRITIN", "TIBC", "IRON", "RETICCTPCT" in the last 72 hours. Sepsis Labs: Recent Labs  Lab 11/02/21 0639 11/03/21 0410 11/04/21 0359  PROCALCITON 0.85 3.18 2.44    Recent Results (from the past 240 hour(s))  MRSA Next Gen by PCR, Nasal     Status: None   Collection Time: 11/02/21  8:00 AM   Specimen: Nasal Mucosa; Nasal Swab  Result Value Ref Range Status   MRSA by PCR Next Gen NOT DETECTED NOT DETECTED Final    Comment: (NOTE) The GeneXpert MRSA Assay (FDA approved for NASAL specimens only), is one component of a comprehensive MRSA colonization surveillance program. It is not intended to diagnose MRSA infection nor to guide or monitor treatment for MRSA infections. Test performance is not FDA approved in patients less than 25 years old. Performed at [facility], [address], [place], [place]          Radiology Studies: No results found.      Scheduled Meds:  enoxaparin (LOVENOX) injection  40 mg Subcutaneous Q24H   hydrocortisone  10 mg Oral BH-q7a   hydrocortisone  5 mg Oral q1600   insulin aspart  0-6 Units Subcutaneous Q4H   insulin aspart  3 Units Subcutaneous  TID WC   insulin detemir  4 Units Subcutaneous BID   levothyroxine  25 mcg Oral Q0600   mirtazapine  30 mg Oral QHS   pantoprazole  40 mg Oral Daily   sodium zirconium cyclosilicate  10 g Oral Once   Continuous Infusions:  sodium chloride Stopped (11/04/21 2214)   cefTRIAXone (ROCEPHIN)  IV Stopped (11/04/21 2144)     LOS: 3 days    Time spent: 30 mins     11/06/21, MD [facility] Pager [phone]  If 7PM-7AM, please contact night-coverage 11/05/2021, 7:29 AM

## 2021-11-05 NOTE — Plan of Care (Signed)
  Problem: Health Behavior/Discharge Planning: Goal: Ability to manage health-related needs will improve Outcome: Progressing   Problem: Clinical Measurements: Goal: Ability to maintain clinical measurements within normal limits will improve Outcome: Progressing   Problem: Clinical Measurements: Goal: Will remain free from infection Outcome: Progressing   Problem: Clinical Measurements: Goal: Respiratory complications will improve Outcome: Progressing   Problem: Clinical Measurements: Goal: Cardiovascular complication will be avoided Outcome: Progressing   Problem: Activity: Goal: Risk for activity intolerance will decrease Outcome: Progressing   Problem: Elimination: Goal: Will not experience complications related to bowel motility Outcome: Progressing   Problem: Elimination: Goal: Will not experience complications related to urinary retention Outcome: Progressing

## 2021-11-06 DIAGNOSIS — N1832 Chronic kidney disease, stage 3b: Secondary | ICD-10-CM | POA: Diagnosis not present

## 2021-11-06 DIAGNOSIS — E1069 Type 1 diabetes mellitus with other specified complication: Secondary | ICD-10-CM | POA: Diagnosis not present

## 2021-11-06 DIAGNOSIS — E875 Hyperkalemia: Secondary | ICD-10-CM | POA: Diagnosis not present

## 2021-11-06 DIAGNOSIS — E2740 Unspecified adrenocortical insufficiency: Secondary | ICD-10-CM | POA: Diagnosis not present

## 2021-11-06 DIAGNOSIS — E10649 Type 1 diabetes mellitus with hypoglycemia without coma: Secondary | ICD-10-CM | POA: Diagnosis not present

## 2021-11-06 LAB — CBC
HCT: 27.8 % — ABNORMAL LOW (ref 36.0–46.0)
Hemoglobin: 9 g/dL — ABNORMAL LOW (ref 12.0–15.0)
MCH: 29.8 pg (ref 26.0–34.0)
MCHC: 32.4 g/dL (ref 30.0–36.0)
MCV: 92.1 fL (ref 80.0–100.0)
Platelets: 142 10*3/uL — ABNORMAL LOW (ref 150–400)
RBC: 3.02 MIL/uL — ABNORMAL LOW (ref 3.87–5.11)
RDW: 12.6 % (ref 11.5–15.5)
WBC: 7.5 10*3/uL (ref 4.0–10.5)
nRBC: 0 % (ref 0.0–0.2)

## 2021-11-06 LAB — GLUCOSE, CAPILLARY
Glucose-Capillary: 125 mg/dL — ABNORMAL HIGH (ref 70–99)
Glucose-Capillary: 154 mg/dL — ABNORMAL HIGH (ref 70–99)
Glucose-Capillary: 292 mg/dL — ABNORMAL HIGH (ref 70–99)
Glucose-Capillary: 89 mg/dL (ref 70–99)

## 2021-11-06 LAB — BASIC METABOLIC PANEL
Anion gap: 5 (ref 5–15)
BUN: 67 mg/dL — ABNORMAL HIGH (ref 6–20)
CO2: 25 mmol/L (ref 22–32)
Calcium: 8.2 mg/dL — ABNORMAL LOW (ref 8.9–10.3)
Chloride: 111 mmol/L (ref 98–111)
Creatinine, Ser: 2.28 mg/dL — ABNORMAL HIGH (ref 0.44–1.00)
GFR, Estimated: 29 mL/min — ABNORMAL LOW (ref >60)
Glucose, Bld: 115 mg/dL — ABNORMAL HIGH (ref 70–99)
Potassium: 4.7 mmol/L (ref 3.5–5.1)
Sodium: 141 mmol/L (ref 135–145)

## 2021-11-06 MED ORDER — LEVOTHYROXINE SODIUM 25 MCG PO TABS
25.0000 ug | ORAL_TABLET | Freq: Every day | ORAL | 0 refills | Status: DC
Start: 2021-11-06 — End: 2022-07-09

## 2021-11-06 MED ORDER — HYDROCORTISONE 10 MG PO TABS: 10.0000 mg | ORAL_TABLET | Freq: Two times a day (BID) | ORAL | Status: DC

## 2021-11-06 NOTE — Progress Notes (Addendum)
Pt blood sugar at 292 glucometer not sinking yet. Will continue to monitor.  Update 0325: Pt blood sugar at 125. Glucometer not sinking yet. Will continue to monitor.

## 2021-11-06 NOTE — Discharge Summary (Signed)
Physician Discharge Summary  Ashley Camacho [id number] DOB: March 10, 1991 DOA: 11/02/2021  PCP: Ashley Camacho, No  Admit date: 11/02/2021 Discharge date: 11/06/2021  Admitted From: home  Disposition:  home   Recommendations for Outpatient Follow-up:  Follow up with PCP in 1 week to get Cr/Kidney function checked F/u w/ endocrinology in 1-2 weeks   Home Health: no  Equipment/Devices:  Discharge Condition: stable  CODE STATUS: full  Diet recommendation: Heart Healthy / Carb Modified   Brief/Interim Summary: HPI was taken from Dr. Damita Camacho:  Ashley Camacho is a 31 y.o. 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  On arrival of EMS blood sugar was in the 20s.  They were unable to obtain IV access and administered glucagon with improvement in glucose to the 40s.  She was lethargic on arrival to the ED whereupon IV access was established and she received an amp of D50 with return of full consciousness.  Patient initially remained stable in the ED and was eating a meal and blood sugar went up to 132 however she again became lethargic and blood sugar rechecked was 30.  She was a administered another amp of D50 and hospitalist consulted for admission. Patient denies recent change in insulin dose, denies poor oral intake, nausea vomiting or diarrhea.  Denies cough, fever or chills.  States that she was in her usual state of health. Additional chart review: Vitals significant for 91/54 that improved with a bolus to 108/65 by admission.  Initial labs with glucose 40, creatinine 1.94 which is at baseline, WBC 3.9, hemoglobin 9.6 and platelets 103000. EKG, personally viewed and interpreted with  sinus tachycardia at 100 with no acute ST-T wave changes.  Chest x-ray report pending. Patient will be admitted to stepdown.    Review of Systems: As mentioned in the history of present illness. All other systems reviewed and are negative.  As per Dr. Jimmye Camacho 6/14-6/16/23: Pt presented w/ hypoglycemia secondary to poorly controlled DM1. Pt's insulin regimen was adjusted while inpatient. Pt received DM education. Pt was encourage to f/u w/ her endocrinologist as well. Of note, did have an episode of hyperkalemia that resolved w/ lokelma. Pt was also had AKI on CKD. Cr was trending up on the day of d/c at 2.28. Pt will f/u w/ her PCP w/in to get Cr/GFR checked. Pt verbalized her understanding    Discharge Diagnoses:  Principal Problem:   Hypoglycemia due to type 1 diabetes mellitus ([place]) Active Problems:   Adrenal insufficiency (HCC)   Depression   Hypothyroidism   HFrEF (heart failure with reduced ejection fraction) (HCC)   Chronic kidney disease, stage 3b (HCC)   Thrombocytopenia (HCC)   UTI (urinary tract infection)  Hypoglycemia: secondary to poorly controlled DM1. No hypoglycemic episodes today so far. Continue on levemir, aspart & SSI w/ accuchecks. Received D50 in ED & glucagon in the field. Resolved   DM1: poorly controlled. No hypoglycemic episodes today so far. Continue on levemir, aspart, SSI w/ accuchecks   Hyperkalemia: resolved    Acute metabolic encephalopathy: resolved    UTI: no urine cx sent. Abxs x 3 days    Adrenal insufficiency: continue on home dose of po steroids    Thrombocytopenia: chronic. Labile  AKI on CKDIIIb: Cr is trending up today. Avoid nephrotoxic meds    Anemia of chronic disease: likely secondary to CKD. No need for a transfusion currently    Chronic systolic CHF: w/ hx of nonischemic cardiomyopathy. Appears euvolemic. Monitor I/Os. Holding coreg, lasix    Hypothyroidism: continue on levothyroxine. Admitted to running out of levothyroxine    Depression: severity unknown. Continue on home dose of remeron   Discharge Instructions  Discharge Instructions     Diet - low sodium heart healthy   Complete by: As directed    Diet Carb Modified   Complete by: As directed    Discharge instructions   Complete by: As directed    F/u w/ PCP in 1 week to check Cr/kidney function.   Increase activity slowly   Complete by: As directed       Allergies as of 11/06/2021   No Known Allergies      Medication List     STOP taking these medications    HumaLOG 100 UNIT/ML injection Generic drug: insulin lispro   Klor-Con M20 20 MEQ tablet Generic drug: potassium chloride SA   magnesium oxide 400 MG tablet Commonly known as: MAG-OX   mirtazapine 30 MG tablet Commonly known as: REMERON   pantoprazole 40 MG tablet Commonly known as: PROTONIX       TAKE these medications    Acetaminophen Extra Strength 500 MG tablet Generic drug: acetaminophen Take 2 tablets (1,000 mg total) by mouth every 6 (six) hours as needed for headache, fever or moderate pain.   BD Insulin Syringe U/F 31G X 5/16" 0.3 ML Misc Generic drug: Insulin Syringe-Needle U-100 Use as directed 5 times daily with Lantus and Novolog.   carvedilol 12.5 MG tablet Commonly known as: COREG Take 12.5 mg by mouth 2 (two) times daily.   escitalopram 10 MG tablet Commonly known as: LEXAPRO Take 10 mg by mouth daily.   furosemide 20 MG tablet Commonly known as: LASIX Take 20 mg by mouth as directed.   hydrocortisone 10 MG tablet Commonly known as: CORTEF Take 1-2 tablets (10-20 mg total) by mouth 2 (two) times daily. Take 2 tablets (20 mg) in the morning and 1 tablet (10 mg) in the evening. What changed:  how much to take when to take this additional instructions Another medication with the same name was removed. Continue taking this medication, and follow the directions you see here.   Lantus 100 UNIT/ML injection Generic drug: insulin  glargine Inject 0.04 mLs (4 Units total) into the skin 2 (two) times daily. Inject 4 units in the morning and 4 units in the evening What changed:  how much to take additional instructions   levothyroxine 25 MCG tablet Commonly known as: SYNTHROID Take 1 tablet (25 mcg total) by mouth daily before breakfast. What changed: when to take this   losartan 25 MG tablet Commonly known as: COZAAR Take 12.5 mg by mouth daily.   multivitamin with minerals Tabs tablet Take 1 tablet by mouth daily.   NovoLOG 100 UNIT/ML injection Generic drug: insulin aspart Inject 2 Units into the skin 3 (three) times daily with meals. What changed: how much to take   torsemide 20 MG tablet Commonly known as: DEMADEX Take 40 mg by mouth 2 (two) times daily.        No Known Allergies  Consultations:    Procedures/Studies: DG Chest Portable 1 View  Result Date: 11/02/2021 CLINICAL DATA:  Hypoglycemia. EXAM: PORTABLE CHEST 1 VIEW COMPARISON:  Chest  radiograph dated 08/22/2021. FINDINGS: The heart size and mediastinal contours are within normal limits. Both lungs are clear. The visualized skeletal structures are unremarkable. IMPRESSION: No active disease. Electronically Signed   By: Anner Crete M.D.   On: 11/02/2021 02:58   (Echo, Carotid, EGD, Colonoscopy, ERCP)    Subjective: Pt denies any complaints    Discharge Exam: Vitals:   11/06/21 0320 11/06/21 0739  BP: 111/72 112/73  Pulse:  77  Resp: 18   Temp: 98.1 F (36.7 C) 98.1 F (36.7 C)  SpO2: 100% 100%   Vitals:   11/05/21 0804 11/05/21 1604 11/06/21 0320 11/06/21 0739  BP: 115/84 112/77 111/72 112/73  Pulse: 94 83  77  Resp: 16 18 18    Temp: 98 F (36.7 C) 98.1 F (36.7 C) 98.1 F (36.7 C) 98.1 F (36.7 C)  TempSrc:   Oral   SpO2: 100% 100% 100% 100%  Weight:      Height:        General: Pt is alert, awake, not in acute distress Cardiovascular:S1/S2 +, no rubs, no gallops Respiratory: CTA bilaterally, no  wheezing, no rhonchi Abdominal: Soft, NT, ND, bowel sounds + Extremities: no edema, no cyanosis    The results of significant diagnostics from this hospitalization (including imaging, microbiology, ancillary and laboratory) are listed below for reference.     Microbiology: Recent Results (from the past 240 hour(s))  MRSA Next Gen by PCR, Nasal     Status: None   Collection Time: 11/02/21  8:00 AM   Specimen: Nasal Mucosa; Nasal Swab  Result Value Ref Range Status   MRSA by PCR Next Gen NOT DETECTED NOT DETECTED Final    Comment: (NOTE) The GeneXpert MRSA Assay (FDA approved for NASAL specimens only), is one component of a comprehensive MRSA colonization surveillance program. It is not intended to diagnose MRSA infection nor to guide or monitor treatment for MRSA infections. Test performance is not FDA approved in patients less than 16 years old. Performed at [facility], [address] [place], [place] [address]      Labs: BNP (last 3 results) Recent Labs    02/25/21 0421 02/28/21 0437 06/16/21 0504  BNP 1,024.1* 715.6* [id number]*   Basic Metabolic Panel: Recent Labs  Lab 11/02/21 0639 11/02/21 2255 11/03/21 0410 11/04/21 0359 11/05/21 0407 11/05/21 1335 11/05/21 1901 11/06/21 0442  NA  --  137 139 143 140  --   --  141  K  --  4.4 4.9 5.0 5.5* 5.4* 5.3* 4.7  CL  --  107 110 111 112*  --   --  111  CO2  --  23 22 24 24   --   --  25  GLUCOSE  --  133* 79 94 103*  --   --  115*  BUN  --  37* 38* 51* 59*  --   --  67*  CREATININE  --  1.94* 1.95* 2.07* 2.12*  --   --  2.28*  CALCIUM  --  8.5* 8.3* 8.1* 8.2*  --   --  8.2*  MG 1.6* 2.3 2.3  --   --   --   --   --   PHOS <1.0* 5.6* 6.1*  --   --   --   --   --    Liver Function Tests: Recent Labs  Lab 11/02/21 1126  AST 32  ALT 21  ALKPHOS 69  BILITOT 0.6  PROT 6.6  ALBUMIN 3.3*   No  results for input(s): "LIPASE", "AMYLASE" in the last 168 hours. No results for input(s): "AMMONIA" in the  last 168 hours. CBC: Recent Labs  Lab 11/02/21 0019 11/03/21 0410 11/04/21 0359 11/05/21 0407 11/06/21 0442  WBC 3.9* 5.5 6.0 8.4 7.5  NEUTROABS 2.0  --   --   --   --   HGB 9.6* 9.3* 9.0* 8.9* 9.0*  HCT 30.3* 27.7* 27.3* 27.8* 27.8*  MCV 92.9 87.4 91.3 93.3 92.1  PLT 103* 132* 140* 136* 142*   Cardiac Enzymes: No results for input(s): "CKTOTAL", "CKMB", "CKMBINDEX", "TROPONINI" in the last 168 hours. BNP: Invalid input(s): "POCBNP" CBG: Recent Labs  Lab 11/05/21 1649 11/05/21 1959 11/06/21 0011 11/06/21 0323 11/06/21 0848  GLUCAP 137* 196* 292* 125* 154*   D-Dimer No results for input(s): "DDIMER" in the last 72 hours. Hgb A1c No results for input(s): "HGBA1C" in the last 72 hours. Lipid Profile No results for input(s): "CHOL", "HDL", "LDLCALC", "TRIG", "CHOLHDL", "LDLDIRECT" in the last 72 hours. Thyroid function studies No results for input(s): "TSH", "T4TOTAL", "T3FREE", "THYROIDAB" in the last 72 hours.  Invalid input(s): "FREET3" Anemia work up No results for input(s): "VITAMINB12", "FOLATE", "FERRITIN", "TIBC", "IRON", "RETICCTPCT" in the last 72 hours. Urinalysis    Component Value Date/Time   COLORURINE YELLOW (A) 11/02/2021 1651   APPEARANCEUR HAZY (A) 11/02/2021 1651   APPEARANCEUR Clear 09/18/2020 1153   LABSPEC 1.006 11/02/2021 1651   LABSPEC 1.026 03/01/2013 1844   PHURINE 5.0 11/02/2021 1651   GLUCOSEU >=500 (A) 11/02/2021 1651   GLUCOSEU >=500 03/01/2013 1844   HGBUR SMALL (A) 11/02/2021 1651   BILIRUBINUR NEGATIVE 11/02/2021 1651   BILIRUBINUR neg 10/23/2020 0953   BILIRUBINUR Negative 09/18/2020 1153   BILIRUBINUR Negative 03/01/2013 1844   KETONESUR NEGATIVE 11/02/2021 1651   PROTEINUR 30 (A) 11/02/2021 1651   UROBILINOGEN 0.2 10/23/2020 0953   NITRITE NEGATIVE 11/02/2021 1651   LEUKOCYTESUR SMALL (A) 11/02/2021 1651   LEUKOCYTESUR Negative 03/01/2013 1844   Sepsis Labs Recent Labs  Lab 11/03/21 0410 11/04/21 0359 11/05/21 0407  11/06/21 0442  WBC 5.5 6.0 8.4 7.5   Microbiology Recent Results (from the past 240 hour(s))  MRSA Next Gen by PCR, Nasal     Status: None   Collection Time: 11/02/21  8:00 AM   Specimen: Nasal Mucosa; Nasal Swab  Result Value Ref Range Status   MRSA by PCR Next Gen NOT DETECTED NOT DETECTED Final    Comment: (NOTE) The GeneXpert MRSA Assay (FDA approved for NASAL specimens only), is one component of a comprehensive MRSA colonization surveillance program. It is not intended to diagnose MRSA infection nor to guide or monitor treatment for MRSA infections. Test performance is not FDA approved in patients less than 53 years old. Performed at [facility], [address], [place], [place] [address]      Time coordinating discharge: Over 30 minutes  SIGNED:   Charise Killian, MD  [facility] 11/06/2021, 11:26 AM Pager   If 7PM-7AM, please contact night-coverage

## 2021-11-06 NOTE — Progress Notes (Signed)
Patient medically cleared with discharge order placed by MD.  Patient's PIVs removed prior to discharge with AVS given to patient by primary RN with education provided regarding follow up appointments and changes to medications.  Patient ambulated off the unit for transport by spouse at main entrance @1245 .

## 2021-11-16 ENCOUNTER — Encounter: Payer: Self-pay | Admitting: Emergency Medicine

## 2021-11-16 ENCOUNTER — Other Ambulatory Visit: Payer: Self-pay

## 2021-11-16 ENCOUNTER — Emergency Department
Admission: EM | Admit: 2021-11-16 | Discharge: 2021-11-17 | Disposition: A | Discharge status: Home / Self Care | Payer: Medicaid Other | Attending: Emergency Medicine | Admitting: Emergency Medicine

## 2021-11-16 DIAGNOSIS — I130 Hypertensive heart and chronic kidney disease with heart failure and stage 1 through stage 4 chronic kidney disease, or unspecified chronic kidney disease: Secondary | ICD-10-CM | POA: Insufficient documentation

## 2021-11-16 DIAGNOSIS — I5043 Acute on chronic combined systolic (congestive) and diastolic (congestive) heart failure: Secondary | ICD-10-CM | POA: Insufficient documentation

## 2021-11-16 DIAGNOSIS — E1022 Type 1 diabetes mellitus with diabetic chronic kidney disease: Secondary | ICD-10-CM | POA: Insufficient documentation

## 2021-11-16 DIAGNOSIS — N1832 Chronic kidney disease, stage 3b: Secondary | ICD-10-CM | POA: Insufficient documentation

## 2021-11-16 DIAGNOSIS — E162 Hypoglycemia, unspecified: Secondary | ICD-10-CM | POA: Diagnosis present

## 2021-11-16 DIAGNOSIS — E10649 Type 1 diabetes mellitus with hypoglycemia without coma: Secondary | ICD-10-CM | POA: Insufficient documentation

## 2021-11-16 DIAGNOSIS — E039 Hypothyroidism, unspecified: Secondary | ICD-10-CM | POA: Diagnosis not present

## 2021-11-16 DIAGNOSIS — E11649 Type 2 diabetes mellitus with hypoglycemia without coma: Secondary | ICD-10-CM | POA: Diagnosis not present

## 2021-11-16 LAB — COMPREHENSIVE METABOLIC PANEL
ALT: 55 U/L — ABNORMAL HIGH (ref 0–44)
AST: 62 U/L — ABNORMAL HIGH (ref 15–41)
Albumin: 4.1 g/dL (ref 3.5–5.0)
Alkaline Phosphatase: 198 U/L — ABNORMAL HIGH (ref 38–126)
Anion gap: 8 (ref 5–15)
BUN: 34 mg/dL — ABNORMAL HIGH (ref 6–20)
CO2: 20 mmol/L — ABNORMAL LOW (ref 22–32)
Calcium: 8.5 mg/dL — ABNORMAL LOW (ref 8.9–10.3)
Chloride: 110 mmol/L (ref 98–111)
Creatinine, Ser: 2.35 mg/dL — ABNORMAL HIGH (ref 0.44–1.00)
GFR, Estimated: 28 mL/min — ABNORMAL LOW (ref >60)
Glucose, Bld: 47 mg/dL — ABNORMAL LOW (ref 70–99)
Potassium: 4.5 mmol/L (ref 3.5–5.1)
Sodium: 138 mmol/L (ref 135–145)
Total Bilirubin: 0.7 mg/dL (ref 0.3–1.2)
Total Protein: 7.1 g/dL (ref 6.5–8.1)

## 2021-11-16 LAB — CBG MONITORING, ED
Glucose-Capillary: 46 mg/dL — ABNORMAL LOW (ref 70–99)
Glucose-Capillary: 80 mg/dL (ref 70–99)
Glucose-Capillary: 133 mg/dL — ABNORMAL HIGH (ref 70–99)
Glucose-Capillary: 109 mg/dL — ABNORMAL HIGH (ref 70–99)
Glucose-Capillary: 124 mg/dL — ABNORMAL HIGH (ref 70–99)

## 2021-11-16 LAB — CBC
HCT: 30.6 % — ABNORMAL LOW (ref 36.0–46.0)
Hemoglobin: 9.4 g/dL — ABNORMAL LOW (ref 12.0–15.0)
MCH: 29.4 pg (ref 26.0–34.0)
MCHC: 30.7 g/dL (ref 30.0–36.0)
MCV: 95.6 fL (ref 80.0–100.0)
Platelets: 191 10*3/uL (ref 150–400)
RBC: 3.2 MIL/uL — ABNORMAL LOW (ref 3.87–5.11)
RDW: 13.9 % (ref 11.5–15.5)
WBC: 6.4 10*3/uL (ref 4.0–10.5)
nRBC: 0 % (ref 0.0–0.2)

## 2021-11-16 LAB — URINALYSIS, ROUTINE W REFLEX MICROSCOPIC
Bilirubin Urine: NEGATIVE
Glucose, UA: NEGATIVE mg/dL
Ketones, ur: NEGATIVE mg/dL
Leukocytes,Ua: NEGATIVE
Nitrite: NEGATIVE
Protein, ur: 100 mg/dL — AB
Specific Gravity, Urine: 1.013 (ref 1.005–1.030)
pH: 5 (ref 5.0–8.0)

## 2021-11-16 LAB — PREGNANCY, URINE: Preg Test, Ur: NEGATIVE

## 2021-11-16 LAB — LIPASE, BLOOD: Lipase: 30 U/L (ref 11–51)

## 2021-11-16 MED ORDER — SODIUM CHLORIDE 0.9 % IV BOLUS
1000.0000 mL | Freq: Once | INTRAVENOUS | Status: AC
  Administered 2021-11-16: 1000 mL via INTRAVENOUS

## 2021-11-16 MED ORDER — HYDROCORTISONE 10 MG PO TABS
40.0000 mg | ORAL_TABLET | Freq: Every day | ORAL | Status: DC
  Administered 2021-11-16: 40 mg via ORAL
  Filled 2021-11-16: qty 4

## 2021-11-16 MED ORDER — ONDANSETRON HCL 4 MG/2ML IJ SOLN
4.0000 mg | Freq: Once | INTRAMUSCULAR | Status: AC
  Administered 2021-11-16: 4 mg via INTRAVENOUS
  Filled 2021-11-16: qty 2

## 2021-11-16 NOTE — ED Notes (Signed)
Patient provided with sandwich tray and apple juice.

## 2021-11-16 NOTE — ED Triage Notes (Signed)
Pt to ED via POV, pt states that she has Adrenal insufficiently and that she ran out of steroids a few days ago. Pt states that she tried to get them refilled but her doctor would not fill them until she was seen. Pt states that she has been dry heaving since this morning and that her CBG has been dropping into the 40's. Pt seen here on 6/12 for hypoglycemia requiring admission. Pt is pale appearing but is in NAD.

## 2021-11-17 MED ORDER — HYDROCORTISONE 10 MG PO TABS: 10.0000 mg | ORAL_TABLET | Freq: Every day | ORAL | 0 refills | Status: AC

## 2021-11-21 DIAGNOSIS — Z419 Encounter for procedure for purposes other than remedying health state, unspecified: Secondary | ICD-10-CM | POA: Diagnosis not present

## 2021-12-22 DIAGNOSIS — Z419 Encounter for procedure for purposes other than remedying health state, unspecified: Secondary | ICD-10-CM | POA: Diagnosis not present

## 2021-12-27 IMAGING — DX DG CHEST 1V PORT
1 series · 1 of 1 positions shown · non-contrast
Comparison: CT 11/22/2020

CLINICAL DATA: Pneumonia.  Pregnant

EXAM:
PORTABLE CHEST 1 VIEW

[chest ap]
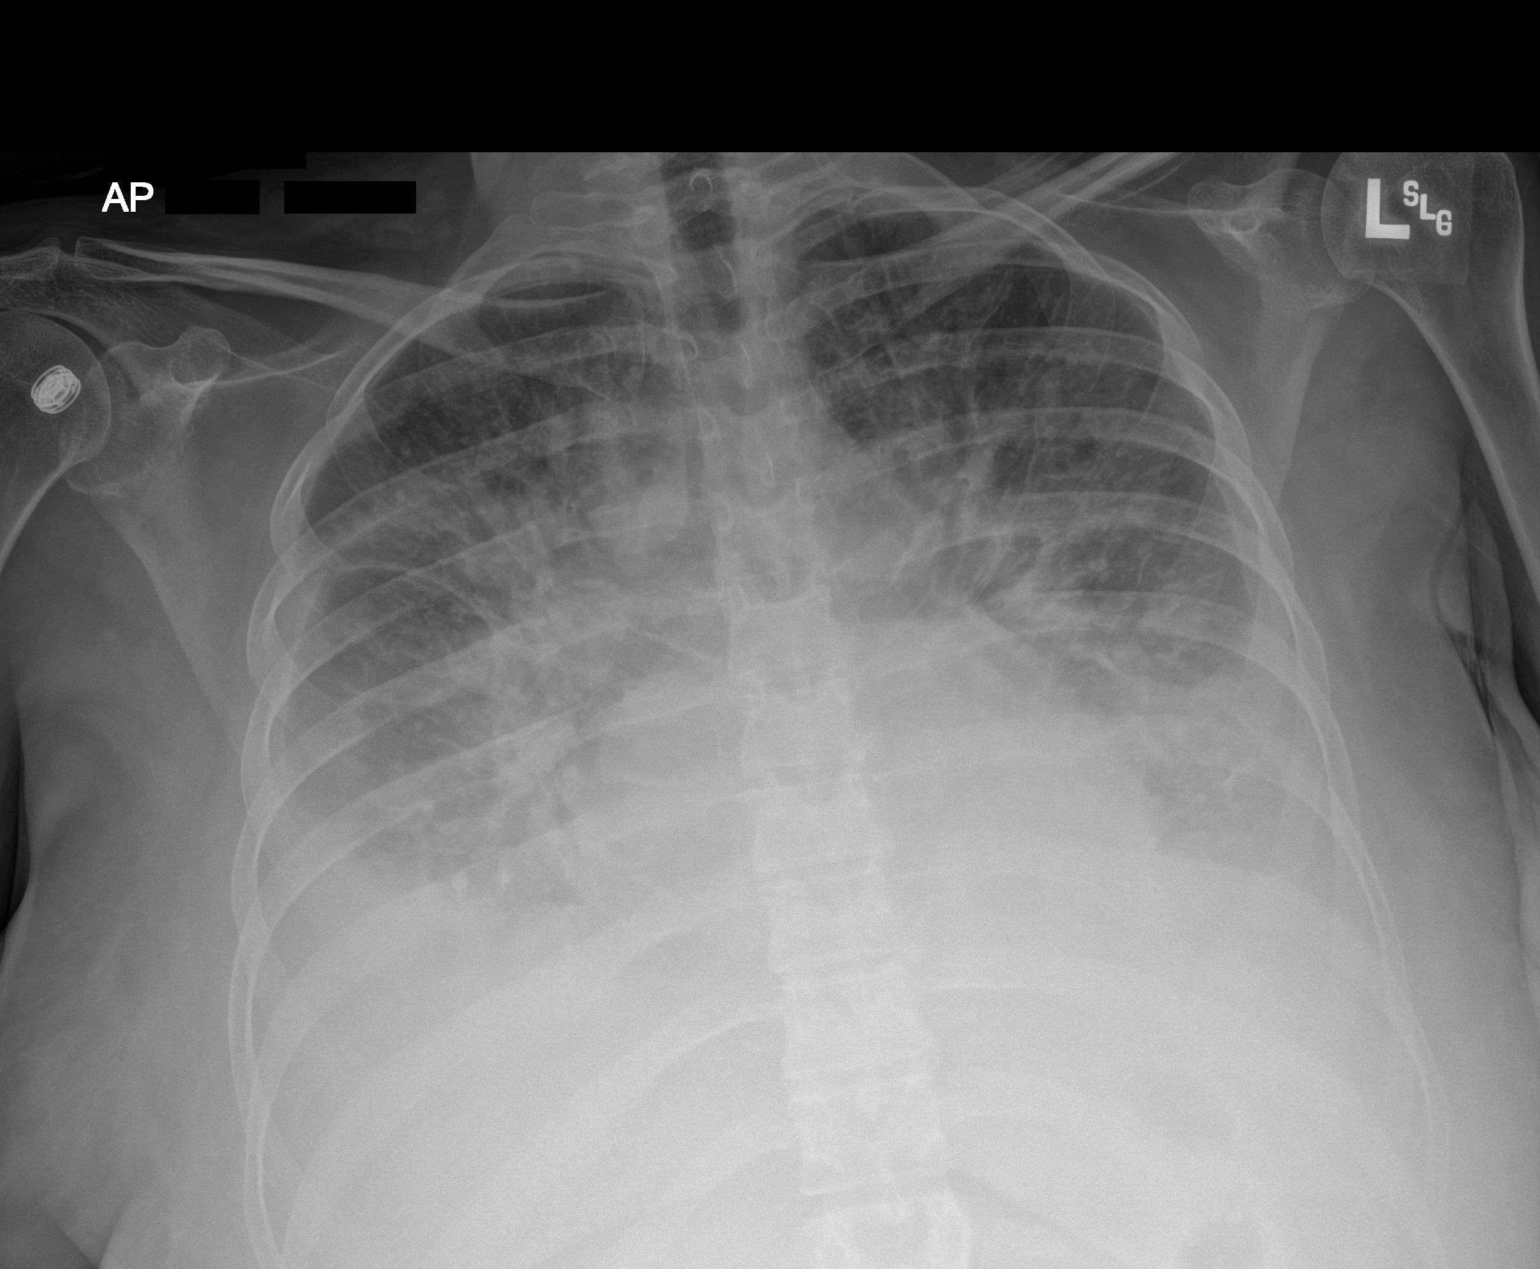

[1 of 1 positions shown; findings below may reference images not displayed]

FINDINGS: Normal cardiac silhouette. bilateral moderate pleural effusions.
There is central venous congestion and mild pulmonary edema. No
pneumothorax.
IMPRESSION: 1. Bilateral pleural effusions similar comparison CT.
2. Potential increase in central pulmonary edema pattern

## 2022-01-01 DIAGNOSIS — N184 Chronic kidney disease, stage 4 (severe): Secondary | ICD-10-CM | POA: Diagnosis not present

## 2022-01-01 DIAGNOSIS — D696 Thrombocytopenia, unspecified: Secondary | ICD-10-CM | POA: Diagnosis not present

## 2022-01-01 DIAGNOSIS — F32A Depression, unspecified: Secondary | ICD-10-CM | POA: Diagnosis not present

## 2022-01-01 DIAGNOSIS — F419 Anxiety disorder, unspecified: Secondary | ICD-10-CM | POA: Diagnosis not present

## 2022-01-01 DIAGNOSIS — I509 Heart failure, unspecified: Secondary | ICD-10-CM | POA: Diagnosis not present

## 2022-01-01 DIAGNOSIS — E271 Primary adrenocortical insufficiency: Secondary | ICD-10-CM | POA: Diagnosis not present

## 2022-01-01 DIAGNOSIS — R0602 Shortness of breath: Secondary | ICD-10-CM | POA: Diagnosis not present

## 2022-01-01 DIAGNOSIS — E1022 Type 1 diabetes mellitus with diabetic chronic kidney disease: Secondary | ICD-10-CM | POA: Diagnosis not present

## 2022-01-01 DIAGNOSIS — E039 Hypothyroidism, unspecified: Secondary | ICD-10-CM | POA: Diagnosis not present

## 2022-01-01 DIAGNOSIS — R0600 Dyspnea, unspecified: Secondary | ICD-10-CM | POA: Diagnosis not present

## 2022-01-01 DIAGNOSIS — Z794 Long term (current) use of insulin: Secondary | ICD-10-CM | POA: Diagnosis not present

## 2022-01-01 DIAGNOSIS — T383X6A Underdosing of insulin and oral hypoglycemic [antidiabetic] drugs, initial encounter: Secondary | ICD-10-CM | POA: Diagnosis not present

## 2022-01-01 DIAGNOSIS — E1021 Type 1 diabetes mellitus with diabetic nephropathy: Secondary | ICD-10-CM | POA: Diagnosis not present

## 2022-01-01 DIAGNOSIS — E10649 Type 1 diabetes mellitus with hypoglycemia without coma: Secondary | ICD-10-CM | POA: Diagnosis not present

## 2022-01-01 DIAGNOSIS — E8729 Other acidosis: Secondary | ICD-10-CM | POA: Diagnosis not present

## 2022-01-01 DIAGNOSIS — E1010 Type 1 diabetes mellitus with ketoacidosis without coma: Secondary | ICD-10-CM | POA: Diagnosis not present

## 2022-01-01 DIAGNOSIS — I5023 Acute on chronic systolic (congestive) heart failure: Secondary | ICD-10-CM | POA: Diagnosis not present

## 2022-01-01 DIAGNOSIS — I425 Other restrictive cardiomyopathy: Secondary | ICD-10-CM | POA: Diagnosis not present

## 2022-01-02 DIAGNOSIS — N184 Chronic kidney disease, stage 4 (severe): Secondary | ICD-10-CM | POA: Diagnosis not present

## 2022-01-02 DIAGNOSIS — E8729 Other acidosis: Secondary | ICD-10-CM | POA: Diagnosis not present

## 2022-01-02 DIAGNOSIS — E1021 Type 1 diabetes mellitus with diabetic nephropathy: Secondary | ICD-10-CM | POA: Diagnosis not present

## 2022-01-02 DIAGNOSIS — E1065 Type 1 diabetes mellitus with hyperglycemia: Secondary | ICD-10-CM | POA: Diagnosis not present

## 2022-01-02 DIAGNOSIS — E271 Primary adrenocortical insufficiency: Secondary | ICD-10-CM | POA: Diagnosis not present

## 2022-01-02 DIAGNOSIS — E039 Hypothyroidism, unspecified: Secondary | ICD-10-CM | POA: Diagnosis not present

## 2022-01-02 DIAGNOSIS — E1010 Type 1 diabetes mellitus with ketoacidosis without coma: Secondary | ICD-10-CM | POA: Diagnosis not present

## 2022-01-02 DIAGNOSIS — I5023 Acute on chronic systolic (congestive) heart failure: Secondary | ICD-10-CM | POA: Diagnosis not present

## 2022-01-03 DIAGNOSIS — E1065 Type 1 diabetes mellitus with hyperglycemia: Secondary | ICD-10-CM | POA: Diagnosis not present

## 2022-01-03 DIAGNOSIS — E2740 Unspecified adrenocortical insufficiency: Secondary | ICD-10-CM | POA: Diagnosis not present

## 2022-01-03 DIAGNOSIS — I5023 Acute on chronic systolic (congestive) heart failure: Secondary | ICD-10-CM | POA: Diagnosis not present

## 2022-01-03 DIAGNOSIS — E1010 Type 1 diabetes mellitus with ketoacidosis without coma: Secondary | ICD-10-CM | POA: Diagnosis not present

## 2022-01-03 DIAGNOSIS — E039 Hypothyroidism, unspecified: Secondary | ICD-10-CM | POA: Diagnosis not present

## 2022-01-03 DIAGNOSIS — N184 Chronic kidney disease, stage 4 (severe): Secondary | ICD-10-CM | POA: Diagnosis not present

## 2022-01-03 DIAGNOSIS — E271 Primary adrenocortical insufficiency: Secondary | ICD-10-CM | POA: Diagnosis not present

## 2022-01-04 DIAGNOSIS — E1065 Type 1 diabetes mellitus with hyperglycemia: Secondary | ICD-10-CM | POA: Diagnosis not present

## 2022-01-04 DIAGNOSIS — E271 Primary adrenocortical insufficiency: Secondary | ICD-10-CM | POA: Diagnosis not present

## 2022-01-04 DIAGNOSIS — E1010 Type 1 diabetes mellitus with ketoacidosis without coma: Secondary | ICD-10-CM | POA: Diagnosis not present

## 2022-01-04 DIAGNOSIS — I5023 Acute on chronic systolic (congestive) heart failure: Secondary | ICD-10-CM | POA: Diagnosis not present

## 2022-01-04 DIAGNOSIS — N184 Chronic kidney disease, stage 4 (severe): Secondary | ICD-10-CM | POA: Diagnosis not present

## 2022-01-04 DIAGNOSIS — E2740 Unspecified adrenocortical insufficiency: Secondary | ICD-10-CM | POA: Diagnosis not present

## 2022-01-04 DIAGNOSIS — E039 Hypothyroidism, unspecified: Secondary | ICD-10-CM | POA: Diagnosis not present

## 2022-01-05 DIAGNOSIS — I5023 Acute on chronic systolic (congestive) heart failure: Secondary | ICD-10-CM | POA: Diagnosis not present

## 2022-01-05 DIAGNOSIS — E1065 Type 1 diabetes mellitus with hyperglycemia: Secondary | ICD-10-CM | POA: Diagnosis not present

## 2022-01-05 DIAGNOSIS — E271 Primary adrenocortical insufficiency: Secondary | ICD-10-CM | POA: Diagnosis not present

## 2022-01-05 DIAGNOSIS — E039 Hypothyroidism, unspecified: Secondary | ICD-10-CM | POA: Diagnosis not present

## 2022-01-05 DIAGNOSIS — E1021 Type 1 diabetes mellitus with diabetic nephropathy: Secondary | ICD-10-CM | POA: Diagnosis not present

## 2022-01-05 DIAGNOSIS — E1010 Type 1 diabetes mellitus with ketoacidosis without coma: Secondary | ICD-10-CM | POA: Diagnosis not present

## 2022-01-05 DIAGNOSIS — R0600 Dyspnea, unspecified: Secondary | ICD-10-CM | POA: Diagnosis not present

## 2022-01-05 DIAGNOSIS — I428 Other cardiomyopathies: Secondary | ICD-10-CM | POA: Diagnosis not present

## 2022-01-05 DIAGNOSIS — N184 Chronic kidney disease, stage 4 (severe): Secondary | ICD-10-CM | POA: Diagnosis not present

## 2022-01-14 DIAGNOSIS — I5020 Unspecified systolic (congestive) heart failure: Secondary | ICD-10-CM | POA: Diagnosis not present

## 2022-01-14 DIAGNOSIS — I10 Essential (primary) hypertension: Secondary | ICD-10-CM | POA: Diagnosis not present

## 2022-01-14 DIAGNOSIS — I428 Other cardiomyopathies: Secondary | ICD-10-CM | POA: Diagnosis not present

## 2022-01-22 DIAGNOSIS — Z419 Encounter for procedure for purposes other than remedying health state, unspecified: Secondary | ICD-10-CM | POA: Diagnosis not present

## 2022-02-07 IMAGING — US US MFM FETAL BPP W/O NON-STRESS
1 series · 15 of 23 positions shown · non-contrast
Comparison: none

[Series 1: us mfm fetal bpp w/o non-stress · 23 acquisitions, 15 frames shown]
[im 1/23]
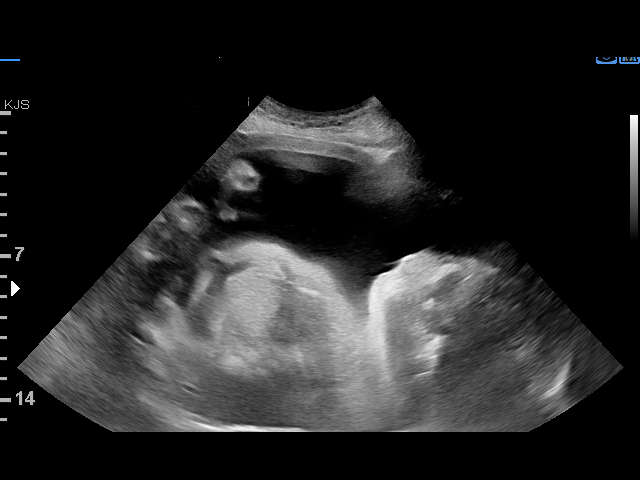
[im 3/23]
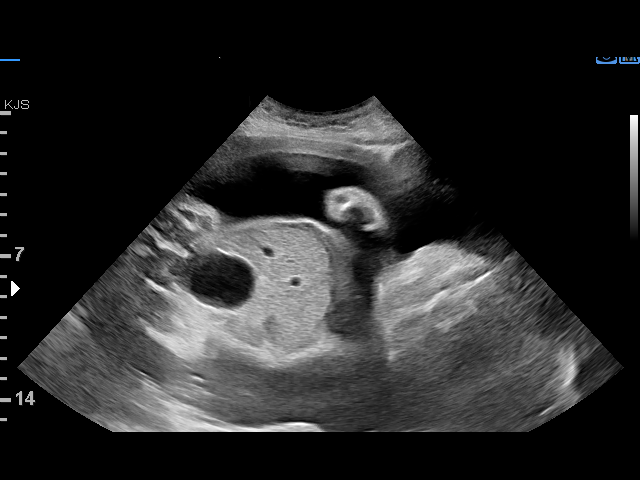
[im 4/23]
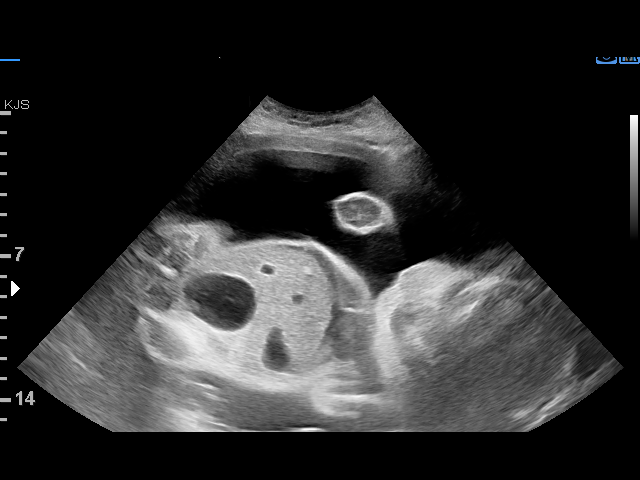
[im 6/23]
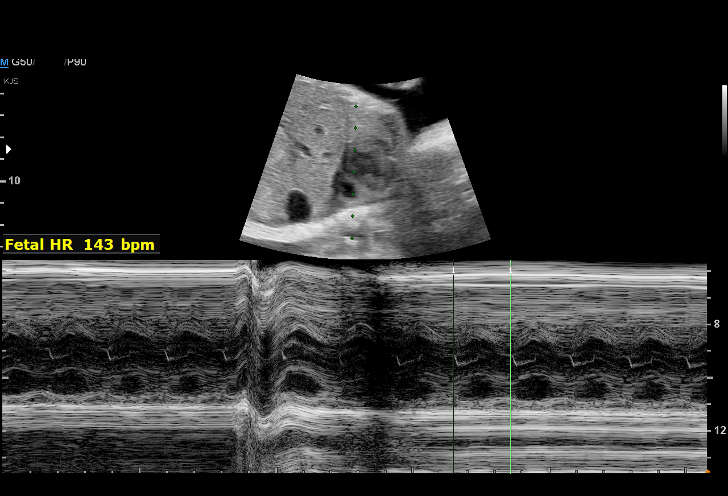
[im 7/23]
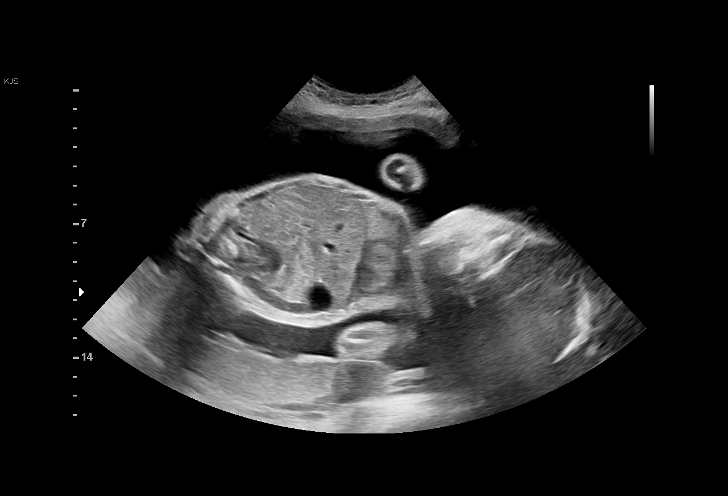
[im 9/23]
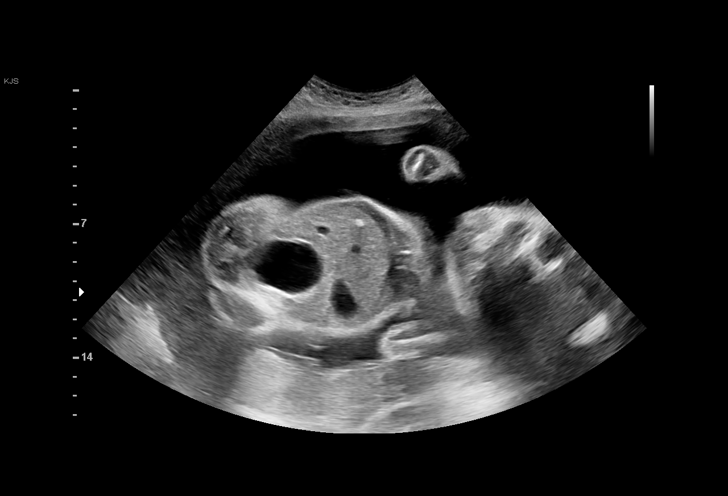
[im 10/23]
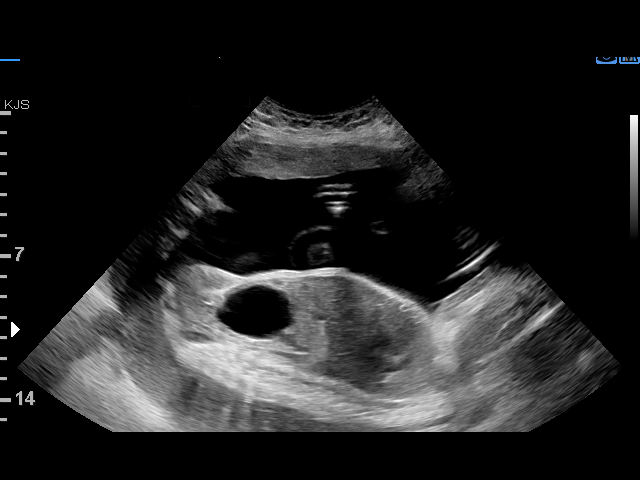
[im 12/23]
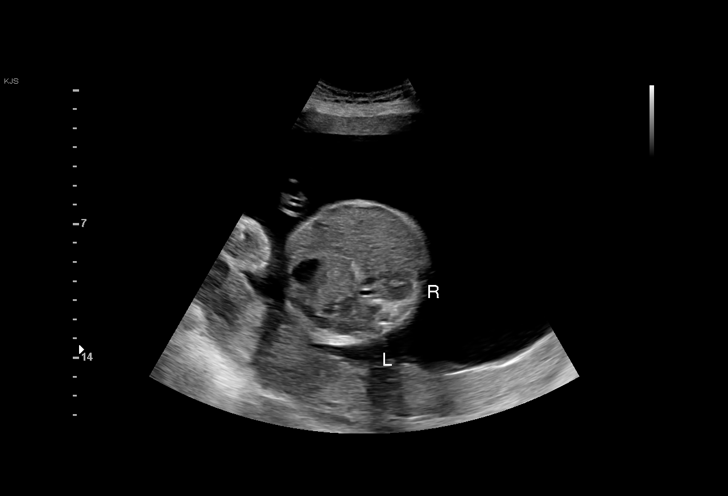
[im 14/23]
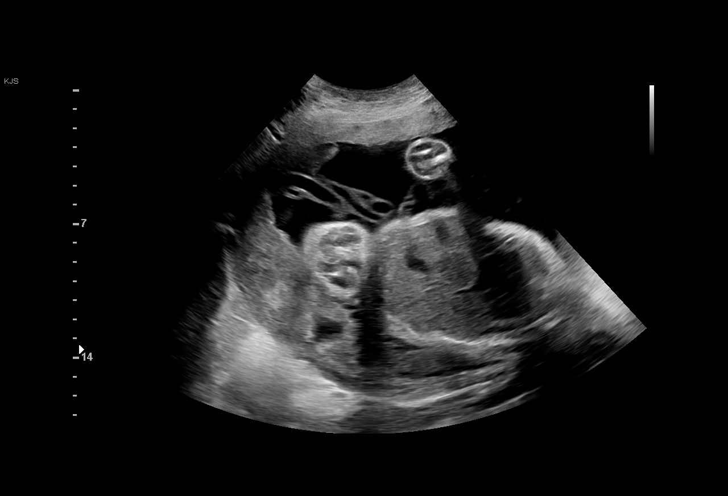
[im 15/23]
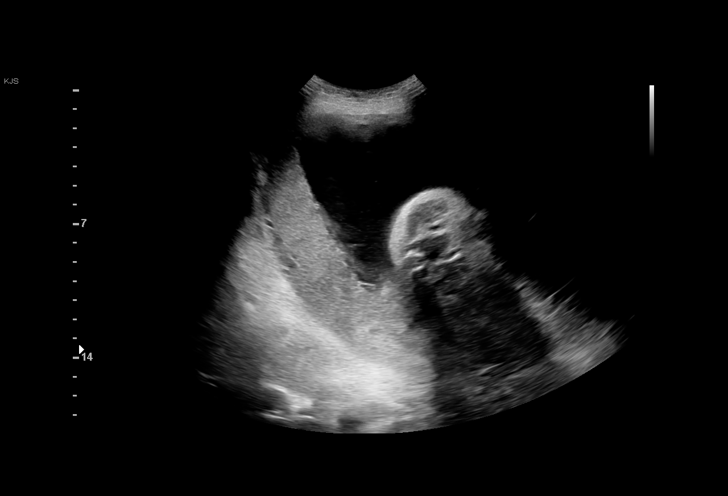
[im 17/23]
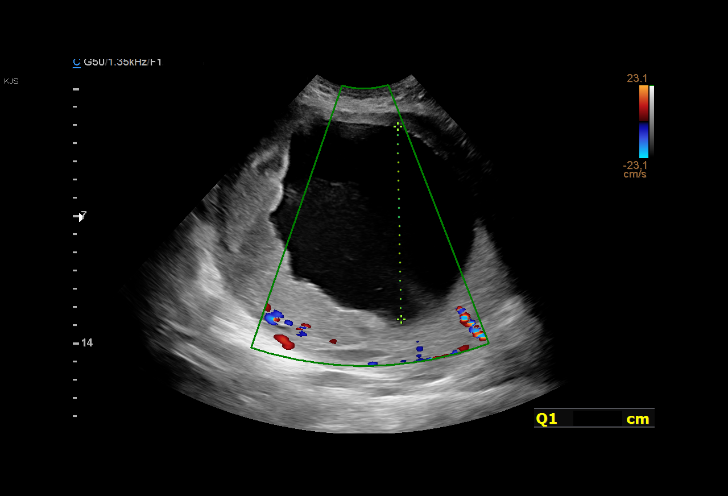
[im 18/23]
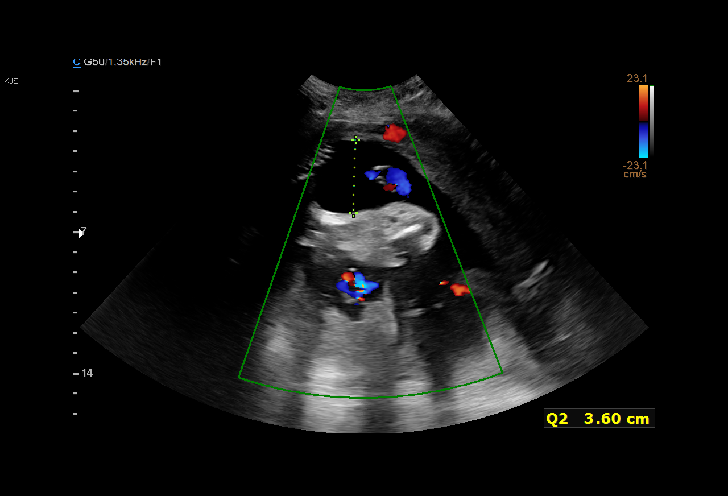
[im 20/23]
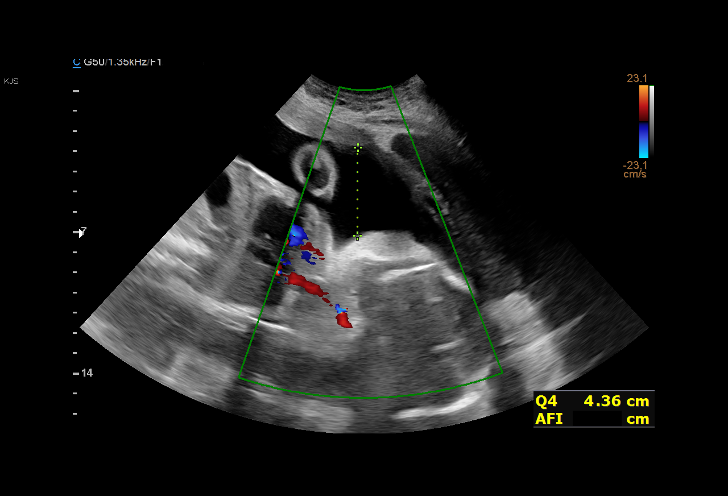
[im 21/23]
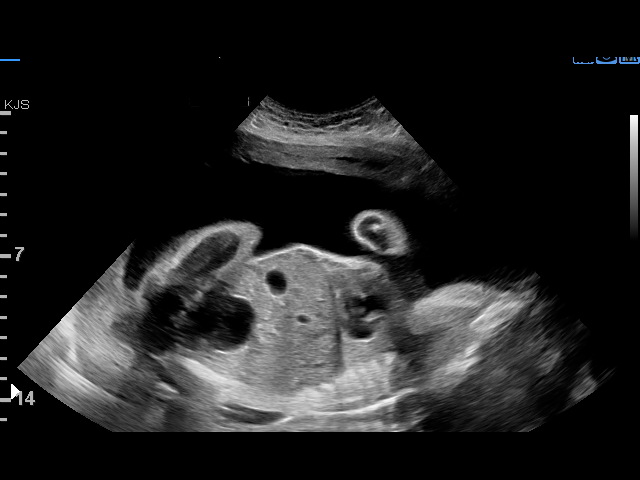
[im 23/23]
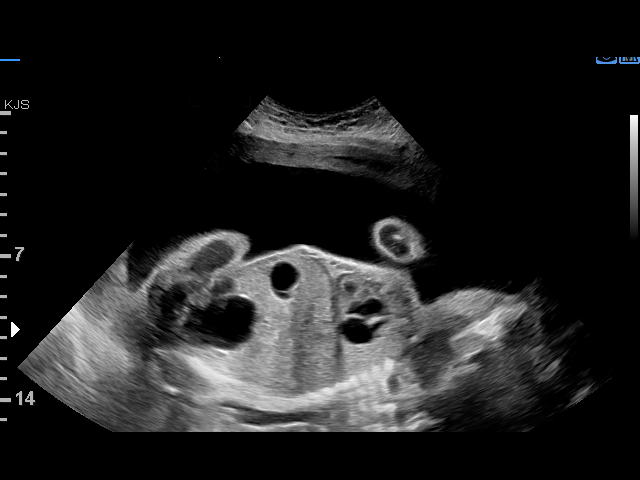

[15 of 23 positions shown; findings below may reference images not displayed]

[REDACTED]
                                                            1610

Indications

 28 weeks gestation of pregnancy
 Polyhydramnios, second trimester,
 antepartum condition or complication, fetus
 unspecified
 Pre-existing diabetes, type 1, in pregnancy,
 second trimester
 Hyperemesis gravidarum
 Anemia during pregnancy in second trimester
 Abnormal fetal ultrasound (? liver
 calcification)
Fetal Evaluation

 Num Of Fetuses:         1
 Fetal Heart Rate(bpm):  143
 Cardiac Activity:       Observed
 Presentation:           Cephalic
 Placenta:               Posterior
 P. Cord Insertion:      Previously Visualized

 Amniotic Fluid
 AFI FV:      Polyhydramnios

 AFI Sum(cm)     %Tile       Largest Pocket(cm)
 25.59           > 97

 RUQ(cm)       RLQ(cm)       LUQ(cm)        LLQ(cm)

Biophysical Evaluation

 Amniotic F.V:   Polyhydramnios             F. Tone:        Observed
 F. Movement:    Observed                   Score:          [DATE]
 F. Breathing:   Observed
Gestational Age

 Best:          28w 0d     Det. By:  Early Ultrasound         EDD:   03/30/21
                                     (08/27/20)
Anatomy

 Diaphragm:             Appears normal         Kidneys:                Appear normal
 Stomach:               Appears normal, left   Bladder:                Appears normal
                        sided
 Abdomen:               Echogenic Focus
                        prev seen
Cervix Uterus Adnexa

 Cervix
 Not visualized (advanced GA >00wks)
Impression

 Ms. Nurmammad is hospitalized for management of Type 1 DM and
 known polyhydramnios
 The biophysical profile was [DATE] with good fetal movement and
 polyhydramnios persist.

 The focal intrabdominal echogenic focus was again seen.
Recommendations

 Continue daily fetal testing as previously recommended.
 Repeat growth 1-2 weeks.

## 2022-02-27 ENCOUNTER — Emergency Department: Payer: Self-pay

## 2022-02-27 ENCOUNTER — Inpatient Hospital Stay
Admission: EM | Admit: 2022-02-27 | Discharge: 2022-03-01 | DRG: 074 | Disposition: A | Discharge status: Home / Self Care | Payer: Self-pay | Attending: Internal Medicine | Admitting: Internal Medicine

## 2022-02-27 ENCOUNTER — Other Ambulatory Visit: Payer: Self-pay

## 2022-02-27 DIAGNOSIS — N1832 Chronic kidney disease, stage 3b: Secondary | ICD-10-CM | POA: Diagnosis present

## 2022-02-27 DIAGNOSIS — E2740 Unspecified adrenocortical insufficiency: Secondary | ICD-10-CM | POA: Diagnosis present

## 2022-02-27 DIAGNOSIS — I428 Other cardiomyopathies: Secondary | ICD-10-CM | POA: Diagnosis present

## 2022-02-27 DIAGNOSIS — E1110 Type 2 diabetes mellitus with ketoacidosis without coma: Secondary | ICD-10-CM | POA: Diagnosis present

## 2022-02-27 DIAGNOSIS — E1010 Type 1 diabetes mellitus with ketoacidosis without coma: Secondary | ICD-10-CM | POA: Diagnosis present

## 2022-02-27 DIAGNOSIS — I129 Hypertensive chronic kidney disease with stage 1 through stage 4 chronic kidney disease, or unspecified chronic kidney disease: Secondary | ICD-10-CM | POA: Diagnosis present

## 2022-02-27 DIAGNOSIS — D72819 Decreased white blood cell count, unspecified: Secondary | ICD-10-CM | POA: Diagnosis present

## 2022-02-27 DIAGNOSIS — Z1152 Encounter for screening for COVID-19: Secondary | ICD-10-CM

## 2022-02-27 DIAGNOSIS — E1022 Type 1 diabetes mellitus with diabetic chronic kidney disease: Secondary | ICD-10-CM | POA: Diagnosis present

## 2022-02-27 DIAGNOSIS — D649 Anemia, unspecified: Secondary | ICD-10-CM | POA: Diagnosis present

## 2022-02-27 DIAGNOSIS — I959 Hypotension, unspecified: Secondary | ICD-10-CM | POA: Diagnosis present

## 2022-02-27 DIAGNOSIS — Z794 Long term (current) use of insulin: Secondary | ICD-10-CM

## 2022-02-27 DIAGNOSIS — I10 Essential (primary) hypertension: Secondary | ICD-10-CM

## 2022-02-27 DIAGNOSIS — E039 Hypothyroidism, unspecified: Secondary | ICD-10-CM | POA: Diagnosis present

## 2022-02-27 DIAGNOSIS — F32A Depression, unspecified: Secondary | ICD-10-CM | POA: Diagnosis present

## 2022-02-27 DIAGNOSIS — E876 Hypokalemia: Secondary | ICD-10-CM

## 2022-02-27 DIAGNOSIS — O903 Peripartum cardiomyopathy: Secondary | ICD-10-CM

## 2022-02-27 DIAGNOSIS — K529 Noninfective gastroenteritis and colitis, unspecified: Secondary | ICD-10-CM | POA: Diagnosis present

## 2022-02-27 DIAGNOSIS — E1165 Type 2 diabetes mellitus with hyperglycemia: Secondary | ICD-10-CM | POA: Diagnosis not present

## 2022-02-27 DIAGNOSIS — R112 Nausea with vomiting, unspecified: Secondary | ICD-10-CM | POA: Diagnosis present

## 2022-02-27 DIAGNOSIS — E860 Dehydration: Secondary | ICD-10-CM | POA: Diagnosis not present

## 2022-02-27 DIAGNOSIS — E1065 Type 1 diabetes mellitus with hyperglycemia: Secondary | ICD-10-CM

## 2022-02-27 DIAGNOSIS — Z79899 Other long term (current) drug therapy: Secondary | ICD-10-CM

## 2022-02-27 DIAGNOSIS — E1043 Type 1 diabetes mellitus with diabetic autonomic (poly)neuropathy: Principal | ICD-10-CM | POA: Diagnosis present

## 2022-02-27 DIAGNOSIS — D696 Thrombocytopenia, unspecified: Secondary | ICD-10-CM | POA: Diagnosis present

## 2022-02-27 DIAGNOSIS — Z833 Family history of diabetes mellitus: Secondary | ICD-10-CM

## 2022-02-27 DIAGNOSIS — K3184 Gastroparesis: Secondary | ICD-10-CM | POA: Diagnosis present

## 2022-02-27 DIAGNOSIS — Z79890 Hormone replacement therapy (postmenopausal): Secondary | ICD-10-CM

## 2022-02-27 DIAGNOSIS — E872 Acidosis, unspecified: Secondary | ICD-10-CM

## 2022-02-27 LAB — BETA-HYDROXYBUTYRIC ACID: Beta-Hydroxybutyric Acid: 3.86 mmol/L — ABNORMAL HIGH (ref 0.05–0.27)

## 2022-02-27 LAB — CBC WITH DIFFERENTIAL/PLATELET
Abs Immature Granulocytes: 0 10*3/uL (ref 0.00–0.07)
Basophils Absolute: 0 10*3/uL (ref 0.0–0.1)
Basophils Relative: 1 %
Eosinophils Absolute: 0 10*3/uL (ref 0.0–0.5)
Eosinophils Relative: 0 %
HCT: 24.8 % — ABNORMAL LOW (ref 36.0–46.0)
Hemoglobin: 7.7 g/dL — ABNORMAL LOW (ref 12.0–15.0)
Immature Granulocytes: 0 %
Lymphocytes Relative: 14 %
Lymphs Abs: 0.4 10*3/uL — ABNORMAL LOW (ref 0.7–4.0)
MCH: 28.9 pg (ref 26.0–34.0)
MCHC: 31 g/dL (ref 30.0–36.0)
MCV: 93.2 fL (ref 80.0–100.0)
Monocytes Absolute: 0 10*3/uL — ABNORMAL LOW (ref 0.1–1.0)
Monocytes Relative: 1 %
Neutro Abs: 2.6 10*3/uL (ref 1.7–7.7)
Neutrophils Relative %: 84 %
Platelets: 65 10*3/uL — ABNORMAL LOW (ref 150–400)
RBC: 2.66 MIL/uL — ABNORMAL LOW (ref 3.87–5.11)
RDW: 13.3 % (ref 11.5–15.5)
WBC: 3.1 10*3/uL — ABNORMAL LOW (ref 4.0–10.5)
nRBC: 0 % (ref 0.0–0.2)

## 2022-02-27 LAB — PROTIME-INR
INR: 2.2 — ABNORMAL HIGH (ref 0.8–1.2)
Prothrombin Time: 24.6 seconds — ABNORMAL HIGH (ref 11.4–15.2)

## 2022-02-27 LAB — COMPREHENSIVE METABOLIC PANEL
ALT: 7 U/L (ref 0–44)
AST: 13 U/L — ABNORMAL LOW (ref 15–41)
Albumin: 2.1 g/dL — ABNORMAL LOW (ref 3.5–5.0)
Alkaline Phosphatase: 43 U/L (ref 38–126)
Anion gap: 8 (ref 5–15)
BUN: 13 mg/dL (ref 6–20)
CO2: 15 mmol/L — ABNORMAL LOW (ref 22–32)
Calcium: 5.7 mg/dL — CL (ref 8.9–10.3)
Chloride: 119 mmol/L — ABNORMAL HIGH (ref 98–111)
Creatinine, Ser: 1.21 mg/dL — ABNORMAL HIGH (ref 0.44–1.00)
GFR, Estimated: >60 mL/min (ref >60)
Glucose, Bld: 216 mg/dL — ABNORMAL HIGH (ref 70–99)
Potassium: 2.7 mmol/L — CL (ref 3.5–5.1)
Sodium: 142 mmol/L (ref 135–145)
Total Bilirubin: 1.5 mg/dL — ABNORMAL HIGH (ref 0.3–1.2)
Total Protein: 3.4 g/dL — ABNORMAL LOW (ref 6.5–8.1)

## 2022-02-27 LAB — BRAIN NATRIURETIC PEPTIDE: B Natriuretic Peptide: 652 pg/mL — ABNORMAL HIGH (ref 0.0–100.0)

## 2022-02-27 LAB — CBG MONITORING, ED: Glucose-Capillary: 352 mg/dL — ABNORMAL HIGH (ref 70–99)

## 2022-02-27 LAB — RESP PANEL BY RT-PCR (FLU A&B, COVID) ARPGX2
Influenza A by PCR: NEGATIVE
Influenza B by PCR: NEGATIVE
SARS Coronavirus 2 by RT PCR: NEGATIVE

## 2022-02-27 LAB — APTT: aPTT: 45 seconds — ABNORMAL HIGH (ref 24–36)

## 2022-02-27 LAB — LACTIC ACID, PLASMA: Lactic Acid, Venous: 1.2 mmol/L (ref 0.5–1.9)

## 2022-02-27 LAB — LIPASE, BLOOD: Lipase: 29 U/L (ref 11–51)

## 2022-02-27 MED ORDER — SODIUM CHLORIDE 0.9 % IV SOLN: 4.0000 g | Freq: Once | INTRAVENOUS | Status: DC

## 2022-02-27 MED ORDER — CALCIUM GLUCONATE-NACL 2-0.675 GM/100ML-% IV SOLN
2.0000 g | Freq: Once | INTRAVENOUS | Status: AC
  Administered 2022-02-28: 2000 mg via INTRAVENOUS
  Filled 2022-02-27: qty 100

## 2022-02-27 MED ORDER — SODIUM CHLORIDE 0.9 % IV BOLUS (SEPSIS)
1000.0000 mL | Freq: Once | INTRAVENOUS | Status: AC
  Administered 2022-02-27: 1000 mL via INTRAVENOUS

## 2022-02-27 MED ORDER — PANTOPRAZOLE SODIUM 40 MG IV SOLR
40.0000 mg | Freq: Once | INTRAVENOUS | Status: AC
  Administered 2022-02-28: 40 mg via INTRAVENOUS
  Filled 2022-02-27: qty 10

## 2022-02-27 MED ORDER — SODIUM CHLORIDE 0.9 % IV SOLN
500.0000 mg | INTRAVENOUS | Status: DC
  Administered 2022-02-28: 500 mg via INTRAVENOUS
  Filled 2022-02-27: qty 5

## 2022-02-27 MED ORDER — DEXAMETHASONE SODIUM PHOSPHATE 10 MG/ML IJ SOLN
10.0000 mg | Freq: Once | INTRAMUSCULAR | Status: AC
  Administered 2022-02-28: 10 mg via INTRAVENOUS
  Filled 2022-02-27: qty 1

## 2022-02-27 MED ORDER — SODIUM CHLORIDE 0.9 % IV SOLN
2.0000 g | INTRAVENOUS | Status: DC
  Administered 2022-02-27: 2 g via INTRAVENOUS
  Filled 2022-02-27: qty 20

## 2022-02-27 MED ORDER — SODIUM CHLORIDE 0.9 % IV BOLUS (SEPSIS)
500.0000 mL | Freq: Once | INTRAVENOUS | Status: AC
  Administered 2022-02-27: 500 mL via INTRAVENOUS

## 2022-02-27 MED ORDER — POTASSIUM CHLORIDE 10 MEQ/100ML IV SOLN
10.0000 meq | INTRAVENOUS | Status: AC
  Administered 2022-02-28 (×5): 10 meq via INTRAVENOUS
  Filled 2022-02-27 (×5): qty 100

## 2022-02-27 MED ORDER — METOCLOPRAMIDE HCL 5 MG/ML IJ SOLN
10.0000 mg | INTRAMUSCULAR | Status: AC
  Administered 2022-02-28: 10 mg via INTRAVENOUS
  Filled 2022-02-27: qty 2

## 2022-02-27 MED ORDER — SODIUM CHLORIDE 0.9 % IV BOLUS
500.0000 mL | Freq: Once | INTRAVENOUS | Status: AC
  Administered 2022-02-28: 500 mL via INTRAVENOUS

## 2022-02-27 NOTE — ED Provider Notes (Signed)
[facility] Provider Note    Event Date/Time   First MD Initiated Contact with Patient 02/27/22 2154     (approximate)   History   Chief Complaint: Shortness of Breath and Hypotension   HPI  Ashley Camacho is a 31 y.o. female with a history of type 1 diabetes, adrenal insufficiency, postpartum cardiomyopathy, DKA, gastroparesis, CKD who comes ED complaining of nausea and vomiting for the past 3 days, unable to take her medicines or eat.  No diarrhea or constipation.  Also complains of some burning chest pain and shortness of breath for the past hour, no aggravating or alleviating factors.  Not pleuritic, not exertional.  No falls or trauma.  No fevers.  She does report that her child was recently sick with a viral illness.  She has a history of adrenal insufficiency and has been taking her usual, not stress dose of hydrocortisone lately.     Physical Exam   Triage Vital Signs: ED Triage Vitals  Enc Vitals Group     BP 02/27/22 2142 (!) 68/53     Pulse Rate 02/27/22 2142 96     Resp 02/27/22 2142 20     Temp 02/27/22 2153 98.5 F (36.9 C)     Temp Source 02/27/22 2153 Oral     SpO2 02/27/22 2142 94 %     Weight 02/27/22 2149 94 lb 3.2 oz (42.7 kg)     Height 02/27/22 2149 5\' 1"  (1.549 m)     Head Circumference --      Peak Flow --      Pain Score --      Pain Loc --      Pain Edu? --      Excl. in [place]? --     Most recent vital signs: Vitals:   02/27/22 2306 02/27/22 2330  BP: (!) 92/57 (!) 101/59  Pulse: 90 86  Resp: 17 14  Temp:    SpO2: 99% 100%    General: Awake, no distress.  Ill-appearing CV:  Good peripheral perfusion.  Regular rate and rhythm.  Thready peripheral pulses. Resp:  Normal effort.  Clear to auscultation bilaterally.  No Kussmaul breathing Abd:  No distention.  Soft nontender Other:  Dry mucous membranes.  No wounds.  No lower extremity edema or calf tenderness.  Normal mental status.   ED Results / Procedures /  Treatments   Labs (all labs ordered are listed, but only abnormal results are displayed) Labs Reviewed  COMPREHENSIVE METABOLIC PANEL - Abnormal; Notable for the following components:      Result Value   Potassium 2.7 (*)    Chloride 119 (*)    CO2 15 (*)    Glucose, Bld 216 (*)    Creatinine, Ser 1.21 (*)    Calcium 5.7 (*)    Total Protein 3.4 (*)    Albumin 2.1 (*)    AST 13 (*)    Total Bilirubin 1.5 (*)    All other components within normal limits  CBC WITH DIFFERENTIAL/PLATELET - Abnormal; Notable for the following components:   WBC 3.1 (*)    RBC 2.66 (*)    Hemoglobin 7.7 (*)    HCT 24.8 (*)    Platelets 65 (*)    Lymphs Abs 0.4 (*)    Monocytes Absolute 0.0 (*)    All other components within normal limits  PROTIME-INR - Abnormal; Notable for the following components:   Prothrombin Time 24.6 (*)    INR  2.2 (*)    All other components within normal limits  APTT - Abnormal; Notable for the following components:   aPTT 45 (*)    All other components within normal limits  BRAIN NATRIURETIC PEPTIDE - Abnormal; Notable for the following components:   B Natriuretic Peptide 652.0 (*)    All other components within normal limits  CBG MONITORING, ED - Abnormal; Notable for the following components:   Glucose-Capillary 352 (*)    All other components within normal limits  RESP PANEL BY RT-PCR (FLU A&B, COVID) ARPGX2  CULTURE, BLOOD (ROUTINE X 2)  CULTURE, BLOOD (ROUTINE X 2)  URINE CULTURE  LACTIC ACID, PLASMA  LIPASE, BLOOD  URINALYSIS, COMPLETE (UACMP) WITH MICROSCOPIC  BETA-HYDROXYBUTYRIC ACID  POC URINE PREG, ED  POC URINE PREG, ED     EKG Interpreted by me Sinus rhythm rate of 95.  Normal axis, normal intervals.  Poor R wave progression.  There is slight inferior ST depression along with T wave inversions.  This appears new from previous.   RADIOLOGY Chest x-ray interpreted by me, appears unremarkable without edema or infiltrate.  Radiology report  reviewed.   PROCEDURES:  .Critical Care  Performed by: Sharman Cheek, MD Authorized by: Sharman Cheek, MD   Critical care provider statement:    Critical care time (minutes):  35   Critical care time was exclusive of:  Separately billable procedures and treating other patients   Critical care was necessary to treat or prevent imminent or life-threatening deterioration of the following conditions:  Shock, dehydration, endocrine crisis and metabolic crisis   Critical care was time spent personally by me on the following activities:  Development of treatment plan with patient or surrogate, discussions with consultants, evaluation of patient's response to treatment, examination of patient, obtaining history from patient or surrogate, ordering and performing treatments and interventions, ordering and review of laboratory studies, ordering and review of radiographic studies, pulse oximetry, re-evaluation of patient's condition and review of old charts   Care discussed with: admitting provider      MEDICATIONS ORDERED IN ED: Medications  sodium chloride 0.9 % bolus 1,000 mL (1,000 mLs Intravenous New Bag/Given 02/27/22 2309)    And  sodium chloride 0.9 % bolus 500 mL (500 mLs Intravenous New Bag/Given 02/27/22 2309)  cefTRIAXone (ROCEPHIN) 2 g in sodium chloride 0.9 % 100 mL IVPB (2 g Intravenous New Bag/Given 02/27/22 2319)  azithromycin (ZITHROMAX) 500 mg in sodium chloride 0.9 % 250 mL IVPB (has no administration in time range)  sodium chloride 0.9 % bolus 500 mL (has no administration in time range)  calcium gluconate 4 g in sodium chloride 0.9 % 100 mL IVPB (has no administration in time range)  potassium chloride 10 mEq in 100 mL IVPB (has no administration in time range)     IMPRESSION / MDM / ASSESSMENT AND PLAN / ED COURSE  I reviewed the triage vital signs and the nursing notes.                              Differential diagnosis includes, but is not limited to, AKI,  electrolyte abnormality, anemia, dehydration, hyperglycemia, DKA, UTI, pneumonia, viral illness, sepsis, gastroparesis  Patient's presentation is most consistent with acute presentation with potential threat to life or bodily function.  Patient presents with intractable vomiting for the past 3 days preventing her from getting adequate oral intake or taking her oral medications.  I think hypertension is multifactorial,  likely due to dehydration as well as adrenal insufficiency.  Labs show potassium of 2.7, calcium of 5.7.  Lactate is normal.  She has chronic anemia roughly at baseline currently.  She has normal she does have moderate ketones.  And a bicarb of 15 but no metabolic acidosis, equivocal for DKA.    EKG shows new ST depression in the inferior leads.  We will add on troponin, though I think this is most likely related to her electrolyte derangement.  Patient ordered IV antibiotics, IV calcium gluconate, IV potassium chloride.  Patient received 2 L IV fluid with good response of IV fluids.  We will also give IV Decadron for steroid replacement in the setting of adrenal insufficiency.   Case discussed with hospitalist for further management.       FINAL CLINICAL IMPRESSION(S) / ED DIAGNOSES   Final diagnoses:  Hypotension, unspecified hypotension type  Type 1 diabetes mellitus with hyperglycemia (HCC)  Dehydration     Rx / DC Orders   ED Discharge Orders     None        Note:  This document was prepared using Dragon voice recognition software and may include unintentional dictation errors.   Sharman Cheek, MD 02/28/22 0001

## 2022-02-27 NOTE — ED Notes (Signed)
Carrie Mew, MD made aware of critical calcium of 5.7 and potassium of 2.7 via face-to-face.

## 2022-02-27 NOTE — Progress Notes (Signed)
Pt being followed by [facility] for Sepsis protocol. 

## 2022-02-27 NOTE — ED Triage Notes (Signed)
Pt arrives POV, C/O SHOB, dizziness, N/V x 3 days.  CP x 1 hour.  Pt States HX of DM1, CHF, and adrenal insufficiency. Pt presents visibly weak. A&Ox4.

## 2022-02-27 NOTE — ED Notes (Signed)
Lab called to help obtain blood cultures

## 2022-02-27 NOTE — Progress Notes (Signed)
CODE SEPSIS - PHARMACY COMMUNICATION  **Broad Spectrum Antibiotics should be administered within 1 hour of Sepsis diagnosis**  Time Code Sepsis Called/Page Received: 2220  Antibiotics Ordered: Azithromycin & Ceftriaxone  Time of 1st antibiotic administration: 2319  Renda Rolls, PharmD, [facility] 02/27/2022 10:17 PM

## 2022-02-28 ENCOUNTER — Encounter: Payer: Self-pay | Admitting: Internal Medicine

## 2022-02-28 DIAGNOSIS — I10 Essential (primary) hypertension: Secondary | ICD-10-CM

## 2022-02-28 DIAGNOSIS — K529 Noninfective gastroenteritis and colitis, unspecified: Secondary | ICD-10-CM | POA: Diagnosis present

## 2022-02-28 DIAGNOSIS — O903 Peripartum cardiomyopathy: Secondary | ICD-10-CM

## 2022-02-28 DIAGNOSIS — E872 Acidosis, unspecified: Secondary | ICD-10-CM

## 2022-02-28 DIAGNOSIS — R112 Nausea with vomiting, unspecified: Secondary | ICD-10-CM | POA: Diagnosis present

## 2022-02-28 DIAGNOSIS — E876 Hypokalemia: Secondary | ICD-10-CM

## 2022-02-28 DIAGNOSIS — E2740 Unspecified adrenocortical insufficiency: Secondary | ICD-10-CM

## 2022-02-28 LAB — BLOOD GAS, VENOUS
Acid-base deficit: 11.6 mmol/L — ABNORMAL HIGH (ref 0.0–2.0)
Bicarbonate: 14.5 mmol/L — ABNORMAL LOW (ref 20.0–28.0)
O2 Saturation: 92.2 %
Patient temperature: 37
pCO2, Ven: 33 mmHg — ABNORMAL LOW (ref 44–60)
pH, Ven: 7.25 (ref 7.25–7.43)
pO2, Ven: 63 mmHg — ABNORMAL HIGH (ref 32–45)

## 2022-02-28 LAB — CBC
HCT: 35.4 % — ABNORMAL LOW (ref 36.0–46.0)
Hemoglobin: 11.1 g/dL — ABNORMAL LOW (ref 12.0–15.0)
MCH: 28.9 pg (ref 26.0–34.0)
MCHC: 31.4 g/dL (ref 30.0–36.0)
MCV: 92.2 fL (ref 80.0–100.0)
Platelets: 91 10*3/uL — ABNORMAL LOW (ref 150–400)
RBC: 3.84 MIL/uL — ABNORMAL LOW (ref 3.87–5.11)
RDW: 13.4 % (ref 11.5–15.5)
WBC: 3.8 10*3/uL — ABNORMAL LOW (ref 4.0–10.5)
nRBC: 0 % (ref 0.0–0.2)

## 2022-02-28 LAB — URINALYSIS, COMPLETE (UACMP) WITH MICROSCOPIC
Bilirubin Urine: NEGATIVE
Glucose, UA: >=500 mg/dL — AB
Ketones, ur: 80 mg/dL — AB
Nitrite: NEGATIVE
Protein, ur: 30 mg/dL — AB
Specific Gravity, Urine: 1.012 (ref 1.005–1.030)
pH: 5 (ref 5.0–8.0)

## 2022-02-28 LAB — BASIC METABOLIC PANEL
Anion gap: 14 (ref 5–15)
Anion gap: 11 (ref 5–15)
BUN: 23 mg/dL — ABNORMAL HIGH (ref 6–20)
BUN: 27 mg/dL — ABNORMAL HIGH (ref 6–20)
CO2: 17 mmol/L — ABNORMAL LOW (ref 22–32)
CO2: 21 mmol/L — ABNORMAL LOW (ref 22–32)
Calcium: 10.6 mg/dL — ABNORMAL HIGH (ref 8.9–10.3)
Calcium: 10 mg/dL (ref 8.9–10.3)
Chloride: 105 mmol/L (ref 98–111)
Chloride: 103 mmol/L (ref 98–111)
Creatinine, Ser: 2.08 mg/dL — ABNORMAL HIGH (ref 0.44–1.00)
Creatinine, Ser: 2.04 mg/dL — ABNORMAL HIGH (ref 0.44–1.00)
GFR, Estimated: 32 mL/min — ABNORMAL LOW (ref >60)
GFR, Estimated: 33 mL/min — ABNORMAL LOW (ref >60)
Glucose, Bld: 406 mg/dL — ABNORMAL HIGH (ref 70–99)
Glucose, Bld: 345 mg/dL — ABNORMAL HIGH (ref 70–99)
Potassium: 6.6 mmol/L (ref 3.5–5.1)
Potassium: 4.7 mmol/L (ref 3.5–5.1)
Sodium: 136 mmol/L (ref 135–145)
Sodium: 135 mmol/L (ref 135–145)

## 2022-02-28 LAB — POTASSIUM: Potassium: 4.7 mmol/L (ref 3.5–5.1)

## 2022-02-28 LAB — PHOSPHORUS: Phosphorus: 3.9 mg/dL (ref 2.5–4.6)

## 2022-02-28 LAB — GLUCOSE, CAPILLARY
Glucose-Capillary: 318 mg/dL — ABNORMAL HIGH (ref 70–99)
Glucose-Capillary: 265 mg/dL — ABNORMAL HIGH (ref 70–99)

## 2022-02-28 LAB — HEMOGLOBIN A1C
Hgb A1c MFr Bld: 10.2 % — ABNORMAL HIGH (ref 4.8–5.6)
Mean Plasma Glucose: 246.04 mg/dL

## 2022-02-28 LAB — CBG MONITORING, ED
Glucose-Capillary: 395 mg/dL — ABNORMAL HIGH (ref 70–99)
Glucose-Capillary: 357 mg/dL — ABNORMAL HIGH (ref 70–99)
Glucose-Capillary: 338 mg/dL — ABNORMAL HIGH (ref 70–99)
Glucose-Capillary: 320 mg/dL — ABNORMAL HIGH (ref 70–99)
Glucose-Capillary: 326 mg/dL — ABNORMAL HIGH (ref 70–99)

## 2022-02-28 LAB — MAGNESIUM: Magnesium: 1.3 mg/dL — ABNORMAL LOW (ref 1.7–2.4)

## 2022-02-28 LAB — TROPONIN I (HIGH SENSITIVITY): Troponin I (High Sensitivity): 22 ng/L — ABNORMAL HIGH (ref <18)

## 2022-02-28 LAB — POC URINE PREG, ED: Preg Test, Ur: NEGATIVE

## 2022-02-28 MED ORDER — ALUM & MAG HYDROXIDE-SIMETH 200-200-20 MG/5ML PO SUSP
15.0000 mL | ORAL | Status: DC | PRN
  Administered 2022-02-28: 15 mL via ORAL
  Filled 2022-02-28: qty 30

## 2022-02-28 MED ORDER — HYDROCORTISONE 10 MG PO TABS
10.0000 mg | ORAL_TABLET | Freq: Every evening | ORAL | Status: DC
  Administered 2022-02-28: 10 mg via ORAL
  Filled 2022-02-28 (×2): qty 1

## 2022-02-28 MED ORDER — ONDANSETRON HCL 4 MG/2ML IJ SOLN: 4.0000 mg | Freq: Four times a day (QID) | INTRAMUSCULAR | Status: DC | PRN

## 2022-02-28 MED ORDER — DEXTROSE-NACL 5-0.45 % IV SOLN: INTRAVENOUS | Status: DC

## 2022-02-28 MED ORDER — SODIUM CHLORIDE 0.9 % IV BOLUS
1000.0000 mL | Freq: Once | INTRAVENOUS | Status: AC
  Administered 2022-02-28: 1000 mL via INTRAVENOUS

## 2022-02-28 MED ORDER — ONDANSETRON HCL 4 MG PO TABS: 4.0000 mg | ORAL_TABLET | Freq: Four times a day (QID) | ORAL | Status: DC | PRN

## 2022-02-28 MED ORDER — DEXTROSE 50 % IV SOLN: 0.0000 mL | INTRAVENOUS | Status: DC | PRN

## 2022-02-28 MED ORDER — POTASSIUM CHLORIDE 20 MEQ PO PACK: 40.0000 meq | PACK | Freq: Once | ORAL | Status: DC

## 2022-02-28 MED ORDER — INSULIN ASPART 100 UNIT/ML IJ SOLN: 3.0000 [IU] | Freq: Three times a day (TID) | INTRAMUSCULAR | Status: DC

## 2022-02-28 MED ORDER — PANTOPRAZOLE SODIUM 40 MG PO TBEC
40.0000 mg | DELAYED_RELEASE_TABLET | Freq: Every day | ORAL | Status: DC
  Administered 2022-02-28: 40 mg via ORAL
  Filled 2022-02-28: qty 1

## 2022-02-28 MED ORDER — INSULIN ASPART 100 UNIT/ML IJ SOLN: 0.0000 [IU] | Freq: Every day | INTRAMUSCULAR | Status: DC

## 2022-02-28 MED ORDER — ESCITALOPRAM OXALATE 10 MG PO TABS
10.0000 mg | ORAL_TABLET | Freq: Every day | ORAL | Status: DC
  Administered 2022-02-28 – 2022-03-01 (×2): 10 mg via ORAL
  Filled 2022-02-28 (×2): qty 1

## 2022-02-28 MED ORDER — PANTOPRAZOLE SODIUM 40 MG IV SOLR: 40.0000 mg | Freq: Two times a day (BID) | INTRAVENOUS | Status: DC

## 2022-02-28 MED ORDER — INSULIN ASPART 100 UNIT/ML IJ SOLN
0.0000 [IU] | Freq: Every day | INTRAMUSCULAR | Status: DC
  Administered 2022-02-28: 3 [IU] via SUBCUTANEOUS
  Filled 2022-02-28: qty 1

## 2022-02-28 MED ORDER — ENOXAPARIN SODIUM 30 MG/0.3ML IJ SOSY
30.0000 mg | PREFILLED_SYRINGE | INTRAMUSCULAR | Status: DC
  Administered 2022-02-28: 30 mg via SUBCUTANEOUS
  Filled 2022-02-28 (×2): qty 0.3

## 2022-02-28 MED ORDER — MAGNESIUM SULFATE 4 GM/100ML IV SOLN
4.0000 g | Freq: Once | INTRAVENOUS | Status: AC
  Administered 2022-02-28: 4 g via INTRAVENOUS
  Filled 2022-02-28: qty 100

## 2022-02-28 MED ORDER — HYDROCORTISONE 10 MG PO TABS
20.0000 mg | ORAL_TABLET | Freq: Every day | ORAL | Status: DC
  Administered 2022-02-28 – 2022-03-01 (×2): 20 mg via ORAL
  Filled 2022-02-28 (×2): qty 2

## 2022-02-28 MED ORDER — ACETAMINOPHEN 650 MG RE SUPP: 650.0000 mg | Freq: Four times a day (QID) | RECTAL | Status: DC | PRN

## 2022-02-28 MED ORDER — LEVOTHYROXINE SODIUM 50 MCG PO TABS
25.0000 ug | ORAL_TABLET | Freq: Every day | ORAL | Status: DC
  Administered 2022-02-28 – 2022-03-01 (×2): 25 ug via ORAL
  Filled 2022-02-28 (×2): qty 1

## 2022-02-28 MED ORDER — INSULIN GLARGINE-YFGN 100 UNIT/ML ~~LOC~~ SOLN
6.0000 [IU] | Freq: Every day | SUBCUTANEOUS | Status: DC
  Administered 2022-02-28: 6 [IU] via SUBCUTANEOUS
  Filled 2022-02-28 (×2): qty 0.06

## 2022-02-28 MED ORDER — INSULIN GLARGINE-YFGN 100 UNIT/ML ~~LOC~~ SOLN: 15.0000 [IU] | Freq: Every day | SUBCUTANEOUS | Status: DC

## 2022-02-28 MED ORDER — TRAZODONE HCL 50 MG PO TABS
25.0000 mg | ORAL_TABLET | Freq: Every evening | ORAL | Status: DC | PRN
  Filled 2022-02-28: qty 1

## 2022-02-28 MED ORDER — MAGNESIUM HYDROXIDE 400 MG/5ML PO SUSP: 30.0000 mL | Freq: Every day | ORAL | Status: DC | PRN

## 2022-02-28 MED ORDER — HYDROCORTISONE 10 MG PO TABS: 10.0000 mg | ORAL_TABLET | Freq: Two times a day (BID) | ORAL | Status: DC

## 2022-02-28 MED ORDER — CARVEDILOL 12.5 MG PO TABS
12.5000 mg | ORAL_TABLET | Freq: Two times a day (BID) | ORAL | Status: DC
  Administered 2022-02-28 – 2022-03-01 (×3): 12.5 mg via ORAL
  Filled 2022-02-28 (×2): qty 1
  Filled 2022-02-28: qty 2

## 2022-02-28 MED ORDER — INSULIN ASPART 100 UNIT/ML IJ SOLN
0.0000 [IU] | Freq: Three times a day (TID) | INTRAMUSCULAR | Status: DC
  Administered 2022-02-28: 11 [IU] via SUBCUTANEOUS
  Filled 2022-02-28: qty 1

## 2022-02-28 MED ORDER — ASPIRIN 81 MG PO TBEC
81.0000 mg | DELAYED_RELEASE_TABLET | Freq: Every day | ORAL | Status: DC
  Administered 2022-02-28 – 2022-03-01 (×2): 81 mg via ORAL
  Filled 2022-02-28 (×2): qty 1

## 2022-02-28 MED ORDER — INSULIN ASPART 100 UNIT/ML IJ SOLN: 0.0000 [IU] | Freq: Three times a day (TID) | INTRAMUSCULAR | Status: DC

## 2022-02-28 MED ORDER — INSULIN ASPART 100 UNIT/ML IJ SOLN
0.0000 [IU] | Freq: Three times a day (TID) | INTRAMUSCULAR | Status: DC
  Administered 2022-02-28 (×2): 7 [IU] via SUBCUTANEOUS
  Administered 2022-03-01: 5 [IU] via SUBCUTANEOUS
  Administered 2022-03-01: 2 [IU] via SUBCUTANEOUS
  Filled 2022-02-28 (×4): qty 1

## 2022-02-28 MED ORDER — PANTOPRAZOLE SODIUM 40 MG PO TBEC
40.0000 mg | DELAYED_RELEASE_TABLET | Freq: Two times a day (BID) | ORAL | Status: DC
  Administered 2022-02-28 – 2022-03-01 (×2): 40 mg via ORAL
  Filled 2022-02-28 (×2): qty 1

## 2022-02-28 MED ORDER — INSULIN REGULAR(HUMAN) IN NACL 100-0.9 UT/100ML-% IV SOLN
INTRAVENOUS | Status: DC
  Administered 2022-02-28: 4.4 [IU]/h via INTRAVENOUS
  Filled 2022-02-28: qty 100

## 2022-02-28 MED ORDER — SODIUM CHLORIDE 0.9 % IV SOLN: INTRAVENOUS | Status: DC

## 2022-02-28 MED ORDER — INSULIN ASPART 100 UNIT/ML IV SOLN
10.0000 [IU] | Freq: Once | INTRAVENOUS | Status: AC
  Administered 2022-02-28: 10 [IU] via INTRAVENOUS
  Filled 2022-02-28: qty 0.1

## 2022-02-28 MED ORDER — INSULIN GLARGINE-YFGN 100 UNIT/ML ~~LOC~~ SOLN
4.0000 [IU] | Freq: Two times a day (BID) | SUBCUTANEOUS | Status: DC
  Filled 2022-02-28: qty 0.04

## 2022-02-28 MED ORDER — INSULIN GLARGINE-YFGN 100 UNIT/ML ~~LOC~~ SOLN
15.0000 [IU] | Freq: Every day | SUBCUTANEOUS | Status: DC
  Filled 2022-02-28: qty 0.15

## 2022-02-28 MED ORDER — SODIUM BICARBONATE 8.4 % IV SOLN
50.0000 meq | Freq: Once | INTRAVENOUS | Status: AC
  Administered 2022-02-28: 50 meq via INTRAVENOUS
  Filled 2022-02-28: qty 50

## 2022-02-28 MED ORDER — ACETAMINOPHEN 325 MG PO TABS: 650.0000 mg | ORAL_TABLET | Freq: Four times a day (QID) | ORAL | Status: DC | PRN

## 2022-02-28 NOTE — Assessment & Plan Note (Signed)
-   We will continue her antihypertensives. 

## 2022-02-28 NOTE — ED Notes (Signed)
Pt states she is feeling okay. Pt given a cup of water and asked to drink.

## 2022-02-28 NOTE — ED Notes (Signed)
Lab called for add on mag level 

## 2022-02-28 NOTE — Assessment & Plan Note (Addendum)
-   The patient will be admitted to a medical telemetry observation bed. - We will continue hydration with IV normal saline with added potassium chloride. - She will be placed on scheduled IV Reglan for associated gastroparesis. - We will place him on IV PPI therapy.

## 2022-02-28 NOTE — Assessment & Plan Note (Signed)
-   We will continue Synthroid. 

## 2022-02-28 NOTE — Assessment & Plan Note (Signed)
-   We will continue with Bumex and Coreg as well as Cozaar.

## 2022-02-28 NOTE — Inpatient Diabetes Management (Signed)
Inpatient Diabetes Program Recommendations  AACE/ADA: New Consensus Statement on Inpatient Glycemic Control   Target Ranges:  Prepandial:   less than 140 mg/dL      Peak postprandial:   less than 180 mg/dL (1-2 hours)      Critically ill patients:  140 - 180 mg/dL    Latest Reference Range & Units 02/28/22 05:50 02/28/22 06:48 02/28/22 07:42 02/28/22 8:15 02/28/22 08:59  Glucose-Capillary 70 - 99 mg/dL 395 (H) 357 (H)  Novolog 10 units @6 :10  IV insulin 4.4 units 338 (H)      IV insulin 4.4 units       IV insulin stopped 320 (H)  Novolog 11 units @8 :35  Semglee 6 units @8 :36    Latest Reference Range & Units 02/27/22 22:26  CO2 22 - 32 mmol/L 15 (L)  Glucose 70 - 99 mg/dL 216 (H)  BUN 6 - 20 mg/dL 13  Creatinine 0.44 - 1.00 mg/dL 1.21 (H)  Calcium 8.9 - 10.3 mg/dL 5.7 (LL)  Anion gap 5 - 15  8    Latest Reference Range & Units 02/27/22 22:26  Beta-Hydroxybutyric Acid 0.05 - 0.27 mmol/L 3.86 (H)    Review of Glycemic Control  Diabetes history: DM1 (does NOT make any insulin; requires basal, correction, and carb coverage insulin) Outpatient Diabetes medications: Lantus 4 units BID, Novolog 3 units TID with meals plus additional for correction Current orders for Inpatient glycemic control: Semglee 6 units daily, Novolog 0-15 units TID with meals, Novolog 0-5 units QHS; Cortef 20 mg QAM, Cortef 10 mg QPM  Inpatient Diabetes Program Recommendations:    Insulin: Please decrease Novolog correction to 0-6 units and change frequency to Q4H. If glucose remains consistently elevated, would recommend re-ordering IV insulin.  NOTE: Noted consult for Diabetes Coordinator. Diabetes Coordinator is not on campus over the weekend but available by pager from 8am to 5pm for questions or concerns. Chart reviewed. Patient admitted with gastroenteritis. Initial labs 02/27/22 @22 :26 with glucose 216 mg/dl, CO2 15, AG 8, and Beta-Hydroxybutyric acid 3.86. Patient was given Decadron 10 mg  at 00:05 on 02/28/22 and glucose up to 406 mg/dl at 4:16 am. Noted IV insulin was started at 6:49 with glucose of 357 mg/dl and insulin drip rate of 4.4 units per hour. It appears IV insulin was stopped at 8:15 am on 02/28/22 and patient was given Novolog 11 units at 8:35 and Semglee 6 units @ 8:36 am today. Patient has Type 1 DM is very sensitive to insulin. Would recommend to decrease Novolog correction to 0-6 units and change frequency to Q4H. If glucose remains consistently over 180 mg/dl, would recommend discontinuing all SQ insulin orders and ordering IV insulin drip.  Thanks, Barnie Alderman, RN, MSN, [place] Diabetes Coordinator Inpatient Diabetes Program [phone] (Team Pager from 8am to [place])

## 2022-02-28 NOTE — Progress Notes (Signed)
PHARMACIST - PHYSICIAN COMMUNICATION  CONCERNING:  Enoxaparin (Lovenox) for DVT Prophylaxis    RECOMMENDATION: Patient was prescribed enoxaprin 40mg  q24 hours for VTE prophylaxis.   Filed Weights   02/27/22 2149  Weight: 42.7 kg (94 lb 3.2 oz)    Body mass index is 17.8 kg/m.  Estimated Creatinine Clearance: 45.8 mL/min (A) (by C-G formula based on SCr of 1.21 mg/dL (H)).  Patient is candidate for enoxaparin 30mg  every 24 hours based on CrCl <35ml/min or Weight <45kg  DESCRIPTION: Pharmacy has adjusted enoxaparin dose per [facility] policy.  Patient is now receiving enoxaparin 30 mg every 24 hours   Renda Rolls, PharmD, [facility] 02/28/2022 12:15 AM

## 2022-02-28 NOTE — Progress Notes (Signed)
[facility]  - [place] [facility]   PATIENT NAME: Ashley Camacho    MR#:  [id number]  DATE OF BIRTH:  08-21-1990  SUBJECTIVE:  no family at bedside. Came in with intractable nausea vomiting. Fluids overall somewhat better. Started on insulin drip for uncontrolled sugars since patient had not taken her insulin at home due to vomiting. Will discontinue insulin drip started on long-acting with sliding scale started on PO diet.    VITALS:  Blood pressure (!) 98/56, pulse 78, temperature (!) 97.5 F (36.4 C), temperature source Oral, resp. rate 16, height 5\' 1"  (1.549 m), weight 42.7 kg, SpO2 100 %, not currently breastfeeding.  PHYSICAL EXAMINATION:   GENERAL:  31 y.o.-year-old patient lying in the bed with no acute distress. Thin fraile LUNGS: Normal breath sounds bilaterally, no wheezing CARDIOVASCULAR: S1, S2 normal. No murmurs,   ABDOMEN: Soft, nontender, nondistended. Bowel sounds present.  EXTREMITIES: No  edema b/l.    NEUROLOGIC: nonfocal  patient is alert and awake SKIN: No obvious rash, lesion, or ulcer.   LABORATORY PANEL:  CBC Recent Labs  Lab 02/28/22 0416  WBC 3.8*  HGB 11.1*  HCT 35.4*  PLT 91*    Chemistries  Recent Labs  Lab 02/27/22 2226 02/28/22 0416 02/28/22 0438 02/28/22 0744  NA 142 136  --   --   K 2.7* 6.6*  --  4.7  CL 119* 105  --   --   CO2 15* 17*  --   --   GLUCOSE 216* 406*  --   --   BUN 13 23*  --   --   CREATININE 1.21* 2.08*  --   --   CALCIUM 5.7* 10.6*  --   --   MG  --   --  1.3*  --   AST 13*  --   --   --   ALT 7  --   --   --   ALKPHOS 43  --   --   --   BILITOT 1.5*  --   --   --    Cardiac Enzymes No results for input(s): "TROPONINI" in the last 168 hours. RADIOLOGY:  DG Chest 2 View  Result Date: 02/27/2022 CLINICAL DATA:  Chest pain EXAM: CHEST - 2 VIEW COMPARISON:  11/02/2021 FINDINGS: The heart size and mediastinal contours are within normal limits. Both lungs are clear. The visualized  skeletal structures are unremarkable. IMPRESSION: No active cardiopulmonary disease. Electronically Signed   By: 01/02/2022 M.D.   On: 02/27/2022 23:05    Assessment and Plan Maci Eickholt Arthurs is a 31 y.o. Caucasian female with medical history significant for type 1 diabetes mellitus and gastroparesis, hypertension and adrenal insufficiency, who presented to the ER with a Kalisetti of intractable nausea and vomiting over the last 3 days with inability to keep solids or fluids  Intractable nausea vomiting gastroparesis continue hydration with IV normal saline  - She will be placed on scheduled IV Reglan for associated gastroparesis. -  on IV PPI therapy. -- Start PO diet.   Hypokalemia - will aggressively replace his potassium and check magnesium level. -- Pharmacy to replete electrolytes   Metabolic acidosis - likely due to early diabetic ketoacidosis that should response to IV fluids for now. - Check metabolic panel today   Adrenal insufficiency (HCC) - We will continue her p.o. steroids.     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 Her last EF according to echo of August 2023 is 30 to 35% -- patient tells me she is scheduled to get a defibrillator placement at [facility] and is going to follow-up.    Hypothyroidism - continue Synthroid.       Procedures:none Family communication :none Consults :none CODE STATUS: full DVT Prophylaxis : Level of care: Med-Surg Status is: Inpatient Remains inpatient appropriate because: n,v, low bp, high sugars    TOTAL TIME TAKING CARE OF THIS PATIENT: 35 minutes.  >50% time spent on counselling and coordination of care  Note: This dictation was prepared with Dragon dictation along with smaller phrase technology. Any transcriptional errors that result from this process are unintentional.  Fritzi Mandes M.D    [facility]   CC: Primary care physician;  Pcp, No

## 2022-02-28 NOTE — Assessment & Plan Note (Signed)
-   We will aggressively replace his potassium and check magnesium level.

## 2022-02-28 NOTE — Consult Note (Signed)
[place] for Electrolyte Monitoring and Replacement   Recent Labs: Potassium (mmol/L)  Date Value  02/28/2022 4.7  03/01/2013 4.1   Magnesium (mg/dL)  Date Value  02/28/2022 1.3 (L)   Calcium (mg/dL)  Date Value  02/28/2022 10.6 (H)   Calcium, Total (mg/dL)  Date Value  03/01/2013 9.1   Albumin (g/dL)  Date Value  02/27/2022 2.1 (L)  03/01/2013 3.9   Phosphorus (mg/dL)  Date Value  02/28/2022 3.9   Sodium (mmol/L)  Date Value  02/28/2022 136  03/01/2013 133 (L)   Assessment: Patient is a 31 y/o F with medical history including T1DM, gastroparesis, HTN, adrenal insufficiency who is admitted with acute gastroenteritis. Pharmacy consulted to assist with electrolyte monitoring and replacement as indicated.  MIVF: NS at 50 cc/hr  Scr 1.21 >> 2.08  Goal of Therapy:  Electrolytes within normal limits  Plan:  --K 2.7 >> 6.6 (erroneous?) >> 4.7 s/p Kcl 10 mEq IV x 5 --Ca 7.2 (corrected) >> 10.6 (un-corrected) after patient received calcium gluconate 2 g IV x 2 doses --Mg 1.3, completed magnesium sulfate 4 g x 1 --No further electrolyte replacement indicated at this time. Noted Scr worsened.  --Follow-up electrolytes with AM labs tomorrow  Benita Gutter 02/28/2022 8:18 AM

## 2022-02-28 NOTE — Progress Notes (Signed)
    PATIENT NAME: Ashley Camacho    MR#:  [id number]  DATE OF BIRTH:  [birth year]    Cross Cover Nurse reports potassium level 6.7 while receiving suppplemental potassium IV. Results reconfirmed with redraw.  Review of labs shows elevated betahydroxybutyric acid and acidosis on labs as well as worsening renal function and hyperglycemia EKG reviewed by me SR no T wave changes, prolonged QT  Orders  10 units IV insulin Stop potassium replacement VBG    Latest Reference Range & Units 02/27/22 22:26 02/28/22 04:16 02/28/22 93:81  BASIC METABOLIC PANEL   Rpt !!   COMPREHENSIVE METABOLIC PANEL  Rpt !!    Sodium 135 - 145 mmol/L 142 136   Potassium 3.5 - 5.1 mmol/L 2.7 (LL) 6.6 (HH)   Chloride 98 - 111 mmol/L 119 (H) 105   CO2 22 - 32 mmol/L 15 (L) 17 (L)   Glucose 70 - 99 mg/dL 216 (H) 406 (H)   BUN 6 - 20 mg/dL 13 23 (H)   Creatinine 0.44 - 1.00 mg/dL 1.21 (H) 2.08 (H)   Calcium 8.9 - 10.3 mg/dL 5.7 (LL) 10.6 (H)   Anion gap 5 - 15  8 14    Magnesium 1.7 - 2.4 mg/dL   1.3 (L)  Alkaline Phosphatase 38 - 126 U/L 43    Albumin 3.5 - 5.0 g/dL 2.1 (L)    Lipase 11 - 51 U/L 29    AST 15 - 41 U/L 13 (L)    ALT 0 - 44 U/L 7    Total Protein 6.5 - 8.1 g/dL 3.4 (L)    Total Bilirubin 0.3 - 1.2 mg/dL 1.5 (H)    GFR, Estimated >60 mL/min >60 32 (L)   !!: Data is critical (LL): Data is critically low (HH): Data is critically high (H): Data is abnormally high (L): Data is abnormally low Rpt: View report in Results Review for more information   Latest Reference Range & Units 02/28/22 05:55  pH, Ven 7.25 - 7.43  7.25  pCO2, Ven 44 - 60 mmHg 33 (L)  pO2, Ven 32 - 45 mmHg 63 (H)  Acid-base deficit 0.0 - 2.0 mmol/L 11.6 (H)  Bicarbonate 20.0 - 28.0 mmol/L 14.5 (L)  O2 Saturation % 92.2  Patient temperature  37.0  Collection site  VEIN  (L): Data is abnormally low (H): Data is abnormally high   Admit to step down on DKA protocol 1 liter NS 1 amp sodium bicarb 4 gm mag Cardiac  monitoring Avoid QT prolonging meds Check phos level - replace electrolytes as needed  Kathlene Cote NP [facility]

## 2022-02-28 NOTE — Assessment & Plan Note (Signed)
-   We will continue her p.o. steroids.  If she has persistent vomiting we will switch to IV.

## 2022-02-28 NOTE — Assessment & Plan Note (Signed)
-   We will replace his calcium and recheck its level.

## 2022-02-28 NOTE — H&P (Addendum)
PATIENT NAME: Ashley Camacho    MR#:  [id number]  DATE OF BIRTH:  07/26/1990  DATE OF ADMISSION:  02/27/2022  PRIMARY CARE PHYSICIAN: Pcp, No   Patient is coming from: Home  REQUESTING/REFERRING PHYSICIAN: Brenton Grills, MD  CHIEF COMPLAINT:   Chief Complaint  Patient presents with   Shortness of Breath   Hypotension    HISTORY OF PRESENT ILLNESS:  SHAKILA Camacho is a 31 y.o. Caucasian female with medical history significant for type 1 diabetes mellitus and gastroparesis, hypertension and adrenal insufficiency, who presented to the ER with a Kalisetti of intractable nausea and vomiting over the last 3 days with inability to keep solids or fluids.  She denies any abdominal pain.  No fever or chills.  She denies any bilious vomitus or hematemesis.  No diarrhea or melena or bright red bleeding per rectum.  No chest pain or palpitations.  She had mild dyspnea without  cough or wheezing. No dysuria, oliguria or hematuria or flank pain.  Blood glucose levels have been elevated.  She has been avoiding her insulin with nausea and vomiting.  ED Course: When she came to the ER, she was hypotensive with BP of 68/53 and otherwise normal vital signs.  Labs revealed hypokalemia of 2.7 and metabolic acidosis with a CO2 of 15 with a blood glucose of 216 and anion gap of 8 with calcium at 5.7 and albumin 2.1 with total bili of 1.5 and total protein of 3.4.  BNP was 652 and high sensitive troponin was 22.  Lactic acid was 1.2 and CBC showed mild leukopenia thrombocytopenia with anemia. EKG as reviewed by me : EKG showed sinus rhythm with a rate of 95 with right atrial enlargement with poor R wave progression and T wave inversion inferiorly.rombocytopenia with anemia Imaging: No acute cardiopulmonary disease.  The patient was given 10 mg of IV Reglan 40 mg IV Protonix bolus 10 mg of IV Decadron and 2 g of IV calcium gluconate with 500 mill per hour IV normal saline bolus.  She  will be admitted to a medical telemetry observation bed for further evaluation and management. PAST MEDICAL HISTORY:   Past Medical History:  Diagnosis Date   Adrenal insufficiency ([place])    Anemia    Gastroparesis    Hypertension    Type 1 diabetes ([place])     PAST SURGICAL HISTORY:   Past Surgical History:  Procedure Laterality Date   BIOPSY  01/14/2021   Procedure: BIOPSY;  Surgeon: Thornton Park, MD;  Location: [place];  Service: Gastroenterology;;   CESAREAN SECTION     CESAREAN SECTION WITH BILATERAL TUBAL LIGATION N/A 02/17/2021   Procedure: CESAREAN SECTION WITH BILATERAL TUBAL LIGATION;  Surgeon: Truett Mainland, DO;  Location: [place] [facility];  Service: Obstetrics;  Laterality: N/A;   ESOPHAGOGASTRODUODENOSCOPY (EGD) WITH PROPOFOL N/A 01/14/2021   Procedure: ESOPHAGOGASTRODUODENOSCOPY (EGD) WITH PROPOFOL;  Surgeon: Thornton Park, MD;  Location: [place];  Service: Gastroenterology;  Laterality: N/A;   MOUTH SURGERY      SOCIAL HISTORY:   Social History   Tobacco Use   Smoking status: Never   Smokeless tobacco: Never  Substance Use Topics   Alcohol use: No    FAMILY HISTORY:   Family History  Problem Relation Age of Onset   Diabetes type I Father    CAD Father    CAD Paternal Grandmother    CAD Paternal 69    Breast cancer Mother  Ovarian cancer Neg Hx     DRUG ALLERGIES:  No Known Allergies  REVIEW OF SYSTEMS:   ROS As per history of present illness. All pertinent systems were reviewed above. Constitutional, HEENT, cardiovascular, respiratory, GI, GU, musculoskeletal, neuro, psychiatric, endocrine, integumentary and hematologic systems were reviewed and are otherwise negative/unremarkable except for positive findings mentioned above in the HPI.   MEDICATIONS AT HOME:   Prior to Admission medications   Medication Sig Start Date End Date Taking? Authorizing Provider  acetaminophen (TYLENOL) 500 MG tablet Take 2 tablets (1,000 mg  total) by mouth every 6 (six) hours as needed for headache, fever or moderate pain. 03/04/21   Constant, Peggy, MD  carvedilol (COREG) 12.5 MG tablet Take 12.5 mg by mouth 2 (two) times daily. 09/13/21   [provider]  escitalopram (LEXAPRO) 10 MG tablet Take 10 mg by mouth daily. 09/01/21   [provider]  furosemide (LASIX) 20 MG tablet Take 20 mg by mouth as directed. 09/04/21   [provider]  hydrocortisone (CORTEF) 10 MG tablet Take 1-2 tablets (10-20 mg total) by mouth 2 (two) times daily. Take 2 tablets (20 mg) in the morning and 1 tablet (10 mg) in the evening. 11/06/21   Charise Killian, MD  insulin aspart (NOVOLOG) 100 UNIT/ML injection Inject 2 Units into the skin 3 (three) times daily with meals. Patient taking differently: Inject 3 Units into the skin 3 (three) times daily with meals. 06/19/21   Lurene Shadow, MD  insulin glargine (LANTUS) 100 UNIT/ML injection Inject 0.04 mLs (4 Units total) into the skin 2 (two) times daily. Inject 4 units in the morning and 4 units in the evening Patient taking differently: Inject 4-6 Units into the skin 2 (two) times daily. Inject 4 units in the morning and 4 units in the evening. May add additional 2 units for large snacks once a day. 06/19/21 11/02/21  Lurene Shadow, MD  Insulin Syringe-Needle U-100 (BD INSULIN SYRINGE U/F) 31G X 5/16" 0.3 ML MISC Use as directed 5 times daily with Lantus and Novolog. 06/19/21   Lurene Shadow, MD  levothyroxine (SYNTHROID) 25 MCG tablet Take 1 tablet (25 mcg total) by mouth daily before breakfast. 11/06/21 12/06/21  Charise Killian, MD  losartan (COZAAR) 25 MG tablet Take 12.5 mg by mouth daily. Patient not taking: Reported on 11/02/2021 06/30/21   [provider]  Multiple Vitamin (MULTIVITAMIN WITH MINERALS) TABS tablet Take 1 tablet by mouth daily. Patient not taking: Reported on 05/20/2021 05/11/21   Esaw Grandchild A, DO  torsemide (DEMADEX) 20 MG tablet Take 40 mg by  mouth 2 (two) times daily. 09/16/21   [provider]      VITAL SIGNS:  Blood pressure (!) 99/58, pulse 96, temperature 98.5 F (36.9 C), temperature source Oral, resp. rate 13, height 5\' 1"  (1.549 m), weight 42.7 kg, SpO2 99 %, not currently breastfeeding.  PHYSICAL EXAMINATION:  Physical Exam  GENERAL:  31 y.o.-year-old Caucasian female patient lying in the bed with no acute distress.  EYES: Pupils equal, round, reactive to light and accommodation. No scleral icterus. Extraocular muscles intact.  HEENT: Head atraumatic, normocephalic. Oropharynx with dry mucous membrane and tongue and nasopharynx clear.  NECK:  Supple, no jugular venous distention. No thyroid enlargement, no tenderness.  LUNGS: Normal breath sounds bilaterally, no wheezing, rales,rhonchi or crepitation. No use of accessory muscles of respiration.  CARDIOVASCULAR: Regular rate and rhythm, S1, S2 normal. No murmurs, rubs, or gallops.  ABDOMEN: Soft, nondistended, nontender. Bowel  sounds present. No organomegaly or mass.  EXTREMITIES: No pedal edema, cyanosis, or clubbing.  NEUROLOGIC: Cranial nerves II through XII are intact. Muscle strength 5/5 in all extremities. Sensation intact. Gait not checked.  PSYCHIATRIC: The patient is alert and oriented x 3.  Normal affect and good eye contact. SKIN: No obvious rash, lesion, or ulcer.   LABORATORY PANEL:   CBC Recent Labs  Lab 02/27/22 2226  WBC 3.1*  HGB 7.7*  HCT 24.8*  PLT 65*   ------------------------------------------------------------------------------------------------------------------  Chemistries  Recent Labs  Lab 02/27/22 2226  NA 142  K 2.7*  CL 119*  CO2 15*  GLUCOSE 216*  BUN 13  CREATININE 1.21*  CALCIUM 5.7*  AST 13*  ALT 7  ALKPHOS 43  BILITOT 1.5*   ------------------------------------------------------------------------------------------------------------------  Cardiac Enzymes No results for input(s): "TROPONINI" in the  last 168 hours. ------------------------------------------------------------------------------------------------------------------  RADIOLOGY:  DG Chest 2 View  Result Date: 02/27/2022 CLINICAL DATA:  Chest pain EXAM: CHEST - 2 VIEW COMPARISON:  11/02/2021 FINDINGS: The heart size and mediastinal contours are within normal limits. Both lungs are clear. The visualized skeletal structures are unremarkable. IMPRESSION: No active cardiopulmonary disease. Electronically Signed   By: Burman Nieves M.D.   On: 02/27/2022 23:05      IMPRESSION AND PLAN:  Assessment and Plan: * Acute gastroenteritis - The patient will be admitted to a medical telemetry observation bed. - We will continue hydration with IV normal saline with added potassium chloride. - She will be placed on scheduled IV Reglan for associated gastroparesis. - We will place him on IV PPI therapy.  Hypokalemia - We will aggressively replace his potassium and check magnesium level.  Hypocalcemia - We will replace his calcium and recheck its level.  Metabolic acidosis - This could be early diabetic ketoacidosis that should response to IV fluids for now. - We will check BMPs.  Adrenal insufficiency (HCC) - We will continue her p.o. steroids.  If she has persistent vomiting we will switch to IV.  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  Essential hypertension - We will continue her antihypertensives.  Hypothyroidism - We will continue Synthroid.   DVT prophylaxis: Lovenox.  Advanced Care Planning:  Code Status: full code.  Family Communication:  The plan of care was discussed in details with the patient (and family). I answered all questions. The patient agreed to proceed with the above mentioned plan. Further management will depend upon hospital course. Disposition Plan: Back to previous home environment Consults called: none.  All the records are reviewed and case  discussed with ED provider.  Status is: Observation  I certify that at the time of admission, it is my clinical judgment that the patient will require  hospital care extending less than 2 midnights.                            Dispo: The patient is from: Home              Anticipated d/c is to: Home              Patient currently is not medically stable to d/c.              Difficult to place patient: No  Hannah Beat M.D on 02/28/2022 at 2:25 AM  [facility]   From 7 PM-7 AM, contact night-coverage [email or web address]  CC: Primary care physician; Pcp, No

## 2022-02-28 NOTE — Assessment & Plan Note (Signed)
-   This could be early diabetic ketoacidosis that should response to IV fluids for now. - We will check BMPs.

## 2022-02-28 NOTE — ED Notes (Signed)
Lab contacted this RN regarding AM K+ level being elevated @0430 , Pt currently on her 5th bag of IV K+. Specimen was obtained via distal IV from K+; K+ paused during this time until repeat sampled could be obtained; repeat specimen obtained via peripheral stick on opposite arm and sent to lab. Lab contacted this RN to inform her that the level was still 6.6 with the peripheral stick verification. Provider B. Randol Kern notified of the patients elevated K+ and ok'd to D/C the K+ at this time - see MAR.

## 2022-03-01 DIAGNOSIS — R112 Nausea with vomiting, unspecified: Secondary | ICD-10-CM

## 2022-03-01 DIAGNOSIS — E1065 Type 1 diabetes mellitus with hyperglycemia: Secondary | ICD-10-CM

## 2022-03-01 DIAGNOSIS — O903 Peripartum cardiomyopathy: Secondary | ICD-10-CM

## 2022-03-01 LAB — URINE CULTURE: Culture: NO GROWTH

## 2022-03-01 LAB — BASIC METABOLIC PANEL
Anion gap: 9 (ref 5–15)
BUN: 33 mg/dL — ABNORMAL HIGH (ref 6–20)
CO2: 23 mmol/L (ref 22–32)
Calcium: 9.6 mg/dL (ref 8.9–10.3)
Chloride: 109 mmol/L (ref 98–111)
Creatinine, Ser: 2.11 mg/dL — ABNORMAL HIGH (ref 0.44–1.00)
GFR, Estimated: 32 mL/min — ABNORMAL LOW (ref >60)
Glucose, Bld: 153 mg/dL — ABNORMAL HIGH (ref 70–99)
Potassium: 4.7 mmol/L (ref 3.5–5.1)
Sodium: 141 mmol/L (ref 135–145)

## 2022-03-01 LAB — PHOSPHORUS: Phosphorus: 2.1 mg/dL — ABNORMAL LOW (ref 2.5–4.6)

## 2022-03-01 LAB — GLUCOSE, CAPILLARY
Glucose-Capillary: 185 mg/dL — ABNORMAL HIGH (ref 70–99)
Glucose-Capillary: 282 mg/dL — ABNORMAL HIGH (ref 70–99)

## 2022-03-01 LAB — MAGNESIUM: Magnesium: 2.3 mg/dL (ref 1.7–2.4)

## 2022-03-01 MED ORDER — INSULIN GLARGINE-YFGN 100 UNIT/ML ~~LOC~~ SOLN
9.0000 [IU] | Freq: Every day | SUBCUTANEOUS | Status: DC
  Administered 2022-03-01: 9 [IU] via SUBCUTANEOUS
  Filled 2022-03-01: qty 0.09

## 2022-03-01 NOTE — Discharge Summary (Signed)
Physician Discharge Summary   Patient: Ashley Camacho MRN: [id number] DOB: 24-Apr-1991  Admit date:     02/27/2022  Discharge date: 03/01/2022  Discharge Physician: Fritzi Mandes   PCP: Pcp, No   Recommendations at discharge:   follow-up with PCP in 1 to 2 weeks F/u with your [facility] cardiology and endocrinology on your appts  Discharge Diagnoses: intractable nausea vomiting/gastroparesis  Hospital Course:  Ashley Camacho is a 31 y.o. Caucasian female with medical history significant for type 1 diabetes mellitus and gastroparesis, hypertension and adrenal insufficiency, who presented to the ER with a [place] of intractable nausea and vomiting over the last 3 days with inability to keep solids or fluids   Intractable nausea vomiting gastroparesis continue hydration with IV normal saline  - on scheduled IV Reglan for associated gastroparesis. -  on IV PPI therapy--change to po -- Start PO diet--tolerating well   Hypokalemia---resolved - --Pharmacy to replete electrolytes   Metabolic acidosis - likely due to early diabetic ketoacidosis that should response to IV fluids for now. - co2 23   H/o Adrenal insufficiency ([place]) -  continue her p.o. steroids.     Postpartum cardiomyopathy (non ischemic) - continue with Coreg  -- patient follows with cardiology at [facility]. Her last EF according to echo of August 2023 is 30 to 35% -- patient tells me she is scheduled to get a defibrillator placement at [facility] and is going to follow-up. --not on cozaar    Hypothyroidism - continue Synthroid.  CKD-IIIB --pt's baseline line creat 1.8--2.5  --stable  Overall hemodynamically stable. Will discharged to home with outpatient follow-up endocrinology and cardiology     Procedures:none Family communication :none Consults :none CODE STATUS: full DVT Prophylaxis : Level of care: Med-Surg     Consultants: none  Disposition: Home Diet recommendation:  Discharge Diet Orders (From  admission, onward)     Start     Ordered   03/01/22 0000  Diet - low sodium heart healthy        03/01/22 1350   03/01/22 0000  Diet Carb Modified        03/01/22 1350           Cardiac and Carb modified diet DISCHARGE MEDICATION: Allergies as of 03/01/2022   No Known Allergies      Medication List     STOP taking these medications    escitalopram 10 MG tablet Commonly known as: LEXAPRO   furosemide 20 MG tablet Commonly known as: LASIX   losartan 25 MG tablet Commonly known as: COZAAR   multivitamin with minerals Tabs tablet   torsemide 20 MG tablet Commonly known as: DEMADEX       TAKE these medications    Acetaminophen Extra Strength 500 MG Tabs Take 2 tablets (1,000 mg total) by mouth every 6 (six) hours as needed for headache, fever or moderate pain.   BD Insulin Syringe U/F 31G X 5/16" 0.3 ML Misc Generic drug: Insulin Syringe-Needle U-100 Use as directed 5 times daily with Lantus and Novolog.   bumetanide 2 MG tablet Commonly known as: BUMEX Take 2 mg by mouth 2 (two) times daily.   carvedilol 12.5 MG tablet Commonly known as: COREG Take 12.5 mg by mouth 2 (two) times daily.   hydrocortisone 10 MG tablet Commonly known as: CORTEF Take 1-2 tablets (10-20 mg total) by mouth 2 (two) times daily. Take 2 tablets (20 mg) in the morning and 1 tablet (10 mg) in the evening.   Lantus 100  UNIT/ML injection Generic drug: insulin glargine Inject 0.04 mLs (4 Units total) into the skin 2 (two) times daily. Inject 4 units in the morning and 4 units in the evening What changed:  how much to take additional instructions   levothyroxine 25 MCG tablet Commonly known as: SYNTHROID Take 1 tablet (25 mcg total) by mouth daily before breakfast.   NovoLOG 100 UNIT/ML injection Generic drug: insulin aspart Inject 2 Units into the skin 3 (three) times daily with meals. What changed: how much to take   pantoprazole 40 MG tablet Commonly known as:  PROTONIX Take 1 tablet by mouth 2 (two) times daily.        Follow-up Information     Croitoru, Mihai, MD. Schedule an appointment as soon as possible for a visit in 1 week(s).   Specialty: Cardiology Contact information: [address] [place] [address] [phone]         Jena Gauss Jamey Ripa, FNP. Call.   Specialty: Cardiology Why: Patient to call and make appointment with cardiology in 1 to 2 week Contact information: [address] [place] [address] [place] [address] [phone]                Discharge Exam: Ceasar Mons Weights   02/27/22 2149  Weight: 42.7 kg     Condition at discharge: fair  The results of significant diagnostics from this hospitalization (including imaging, microbiology, ancillary and laboratory) are listed below for reference.   Imaging Studies: DG Chest 2 View  Result Date: 02/27/2022 CLINICAL DATA:  Chest pain EXAM: CHEST - 2 VIEW COMPARISON:  11/02/2021 FINDINGS: The heart size and mediastinal contours are within normal limits. Both lungs are clear. The visualized skeletal structures are unremarkable. IMPRESSION: No active cardiopulmonary disease. Electronically Signed   By: Burman Nieves M.D.   On: 02/27/2022 23:05    Microbiology: Results for orders placed or performed during the hospital encounter of 02/27/22  Resp Panel by RT-PCR (Flu A&B, Covid) Anterior Nasal Swab     Status: None   Collection Time: 02/27/22 10:26 PM   Specimen: Anterior Nasal Swab  Result Value Ref Range Status   SARS Coronavirus 2 by RT PCR NEGATIVE NEGATIVE Final    Comment: (NOTE) SARS-CoV-2 target nucleic acids are NOT DETECTED.  The SARS-CoV-2 RNA is generally detectable in upper respiratory specimens during the acute phase of infection. The lowest concentration of SARS-CoV-2 viral copies this assay can detect is 138 copies/mL. A negative result does not preclude SARS-Cov-2 infection and should not be used as the sole basis  for treatment or other patient management decisions. A negative result may occur with  improper specimen collection/handling, submission of specimen other than nasopharyngeal swab, presence of viral mutation(s) within the areas targeted by this assay, and inadequate number of viral copies(<138 copies/mL). A negative result must be combined with clinical observations, patient history, and epidemiological information. The expected result is Negative.  Fact Sheet for Patients:  BloggerCourse.com  Fact Sheet for Healthcare Providers:  SeriousBroker.it  This test is no t yet approved or cleared by the [place] FDA and  has been authorized for detection and/or diagnosis of SARS-CoV-2 by FDA under an Emergency Use Authorization (EUA). This EUA will remain  in effect (meaning this test can be used) for the duration of the COVID-19 declaration under Section 564(b)(1) of the Act, 21 U.S.C.section 360bbb-3(b)(1), unless the authorization is terminated  or revoked sooner.       Influenza A by PCR NEGATIVE NEGATIVE  Final   Influenza B by PCR NEGATIVE NEGATIVE Final    Comment: (NOTE) The Xpert Xpress SARS-CoV-2/FLU/RSV plus assay is intended as an aid in the diagnosis of influenza from Nasopharyngeal swab specimens and should not be used as a sole basis for treatment. Nasal washings and aspirates are unacceptable for Xpert Xpress SARS-CoV-2/FLU/RSV testing.  Fact Sheet for Patients: BloggerCourse.com  Fact Sheet for Healthcare Providers: SeriousBroker.it  This test is not yet approved or cleared by the [place] FDA and has been authorized for detection and/or diagnosis of SARS-CoV-2 by FDA under an Emergency Use Authorization (EUA). This EUA will remain in effect (meaning this test can be used) for the duration of the COVID-19 declaration under Section 564(b)(1) of the Act, 21  U.S.C. section 360bbb-3(b)(1), unless the authorization is terminated or revoked.  Performed at [facility], [address] [place], [place] [address]   Blood Culture (routine x 2)     Status: None (Preliminary result)   Collection Time: 02/27/22 11:07 PM   Specimen: BLOOD  Result Value Ref Range Status   Specimen Description BLOOD LEFT ANTECUBITAL  Final   Special Requests   Final    BOTTLES DRAWN AEROBIC AND ANAEROBIC Blood Culture adequate volume   Culture   Final    NO GROWTH 2 DAYS Performed at [facility], [address], [place], [place] [address]    Report Status PENDING  Incomplete  Blood Culture (routine x 2)     Status: None (Preliminary result)   Collection Time: 02/28/22 12:15 AM   Specimen: BLOOD  Result Value Ref Range Status   Specimen Description BLOOD BLOOD RIGHT HAND  Final   Special Requests   Final    BOTTLES DRAWN AEROBIC ONLY Blood Culture adequate volume   Culture   Final    NO GROWTH 1 DAY Performed at [facility], [address] [place], [place] [address]    Report Status PENDING  Incomplete  Urine Culture     Status: None   Collection Time: 02/28/22  6:00 AM   Specimen: In/Out Cath Urine  Result Value Ref Range Status   Specimen Description   Final    IN/OUT CATH URINE Performed at [facility], [address] [place], [place] [address]    Special Requests   Final    NONE Performed at [facility], [address] [place], [place] [address]    Culture   Final    NO GROWTH Performed at [facility], [address], [place], [place] [address]    Report Status 03/01/2022 FINAL  Final    Labs: CBC: Recent Labs  Lab 02/27/22 2226 02/28/22 0416  WBC 3.1* 3.8*  NEUTROABS 2.6  --   HGB 7.7* 11.1*  HCT 24.8* 35.4*  MCV 93.2 92.2  PLT 65* 91*   Basic Metabolic Panel: Recent Labs  Lab 02/27/22 2226 02/28/22 0416 02/28/22 0438 02/28/22 0744 02/28/22 1545 03/01/22 0548   NA 142 136  --   --  135 141  K 2.7* 6.6*  --  4.7 4.7 4.7  CL 119* 105  --   --  103 109  CO2 15* 17*  --   --  21* 23  GLUCOSE 216* 406*  --   --  345* 153*  BUN 13 23*  --   --  27* 33*  CREATININE 1.21* 2.08*  --   --  2.04* 2.11*  CALCIUM 5.7* 10.6*  --   --  10.0 9.6  MG  --   --  1.3*  --   --  2.3  PHOS  --  3.9  --   --   --  2.1*   Liver Function Tests: Recent Labs  Lab 02/27/22 2226  AST 13*  ALT 7  ALKPHOS 43  BILITOT 1.5*  PROT 3.4*  ALBUMIN 2.1*   CBG: Recent Labs  Lab 02/28/22 1208 02/28/22 1736 02/28/22 2103 03/01/22 0806 03/01/22 1137  GLUCAP 326* 318* 265* 185* 282*    Discharge time spent: greater than 30 minutes.  Signed: Enedina Finner, MD [facility] 03/01/2022

## 2022-03-01 NOTE — Discharge Instructions (Signed)
Resume your sugar check and sliding scale along with long-acting insulin as before

## 2022-03-01 NOTE — Inpatient Diabetes Management (Signed)
Inpatient Diabetes Program Recommendations  AACE/ADA: New Consensus Statement on Inpatient Glycemic Control (2015)  Target Ranges:  Prepandial:   less than 140 mg/dL      Peak postprandial:   less than 180 mg/dL (1-2 hours)      Critically ill patients:  140 - 180 mg/dL   Lab Results  Component Value Date   GLUCAP 282 (H) 03/01/2022   HGBA1C 10.2 (H) 02/28/2022    Review of Glycemic Control  Latest Reference Range & Units 03/01/22 08:06 03/01/22 11:37  Glucose-Capillary 70 - 99 mg/dL 185 (H) 282 (H)  (H): Data is abnormally high  Diabetes history: DM1(does not make insulin.  Needs correction, basal and meal coverage)  Outpatient Diabetes medications:  Novolog 3 units TID Lantus 6 units QD  Current orders for Inpatient glycemic control:  Semglee 9 units QD Novolog 0-9 units TID and 0-5 units QHS Cortef 20 mg QAM & 10 QHS  Inpatient Diabetes Program Recommendations:    Please consider:  Novolog 3 units TID with meals if consumes at least 50%.  Will continue to follow while inpatient.  Thank you, Reche Dixon, MSN, [place] Diabetes Coordinator Inpatient Diabetes Program [phone] (team pager from 8a-5p)

## 2022-03-01 NOTE — Consult Note (Signed)
[facility] for Electrolyte Monitoring and Replacement   Recent Labs: Potassium (mmol/L)  Date Value  03/01/2022 4.7  03/01/2013 4.1   Magnesium (mg/dL)  Date Value  03/01/2022 2.3   Calcium (mg/dL)  Date Value  03/01/2022 9.6   Calcium, Total (mg/dL)  Date Value  03/01/2013 9.1   Albumin (g/dL)  Date Value  02/27/2022 2.1 (L)  03/01/2013 3.9   Phosphorus (mg/dL)  Date Value  03/01/2022 2.1 (L)   Sodium (mmol/L)  Date Value  03/01/2022 141  03/01/2013 133 (L)   Assessment: Patient is a 31 y/o F with medical history including T1DM, gastroparesis, HTN, adrenal insufficiency who is admitted with acute gastroenteritis. Pharmacy consulted to assist with electrolyte monitoring and replacement as indicated.  MIVF: NS stopped 10/8  Po diet ordered 10/8  Scr 1.21 -->2.11   Goal of Therapy:  Electrolytes within normal limits  Plan:  --Phos 2.1- with worsening renal function and reintroduction of po diet, will not replace and will f/u tomorrow --Follow-up electrolytes with AM labs tomorrow  Alison Murray 03/01/2022 2:09 PM

## 2022-03-01 NOTE — Progress Notes (Signed)
Ashley Camacho to be D/C'd Home per MD order.  Discussed prescriptions and follow up appointments with the patient. Prescriptions given to patient, medication list explained in detail. Pt verbalized understanding.  Allergies as of 03/01/2022   No Known Allergies      Medication List     STOP taking these medications    escitalopram 10 MG tablet Commonly known as: LEXAPRO   furosemide 20 MG tablet Commonly known as: LASIX   losartan 25 MG tablet Commonly known as: COZAAR   multivitamin with minerals Tabs tablet   torsemide 20 MG tablet Commonly known as: DEMADEX       TAKE these medications    Acetaminophen Extra Strength 500 MG Tabs Take 2 tablets (1,000 mg total) by mouth every 6 (six) hours as needed for headache, fever or moderate pain.   BD Insulin Syringe U/F 31G X 5/16" 0.3 ML Misc Generic drug: Insulin Syringe-Needle U-100 Use as directed 5 times daily with Lantus and Novolog.   bumetanide 2 MG tablet Commonly known as: BUMEX Take 2 mg by mouth 2 (two) times daily.   carvedilol 12.5 MG tablet Commonly known as: COREG Take 12.5 mg by mouth 2 (two) times daily.   hydrocortisone 10 MG tablet Commonly known as: CORTEF Take 1-2 tablets (10-20 mg total) by mouth 2 (two) times daily. Take 2 tablets (20 mg) in the morning and 1 tablet (10 mg) in the evening.   Lantus 100 UNIT/ML injection Generic drug: insulin glargine Inject 0.04 mLs (4 Units total) into the skin 2 (two) times daily. Inject 4 units in the morning and 4 units in the evening What changed:  how much to take additional instructions   levothyroxine 25 MCG tablet Commonly known as: SYNTHROID Take 1 tablet (25 mcg total) by mouth daily before breakfast.   NovoLOG 100 UNIT/ML injection Generic drug: insulin aspart Inject 2 Units into the skin 3 (three) times daily with meals. What changed: how much to take   pantoprazole 40 MG tablet Commonly known as: PROTONIX Take 1 tablet by mouth 2  (two) times daily.        Vitals:   03/01/22 0508 03/01/22 0808  BP: 110/64 111/72  Pulse: 73 75  Resp: 14 18  Temp: 98.7 F (37.1 C) 98.3 F (36.8 C)  SpO2: 99% 99%    Skin clean, dry and intact without evidence of skin break down, no evidence of skin tears noted. IV catheter discontinued intact. Site without signs and symptoms of complications. Dressing and pressure applied. Pt denies pain at this time. No complaints noted.  An After Visit Summary was printed and given to the patient. Patient escorted via [place], and D/C home via private auto.  [place] C. Deatra Ina

## 2022-03-04 LAB — CULTURE, BLOOD (ROUTINE X 2)
Culture: NO GROWTH
Special Requests: ADEQUATE

## 2022-03-05 LAB — CULTURE, BLOOD (ROUTINE X 2)
Culture: NO GROWTH
Special Requests: ADEQUATE

## 2022-03-17 LAB — HSV DNA BY PCR (REFERENCE LAB)
HSV 1 DNA: NEGATIVE
HSV 2 DNA: NEGATIVE

## 2022-06-07 IMAGING — DX DG CHEST 1V PORT
1 series · 1 of 1 positions shown · non-contrast
Comparison: Radiograph 04/27/2021

CLINICAL DATA: AMS, hypoglycemia

EXAM:
PORTABLE CHEST 1 VIEW

[chest ap]
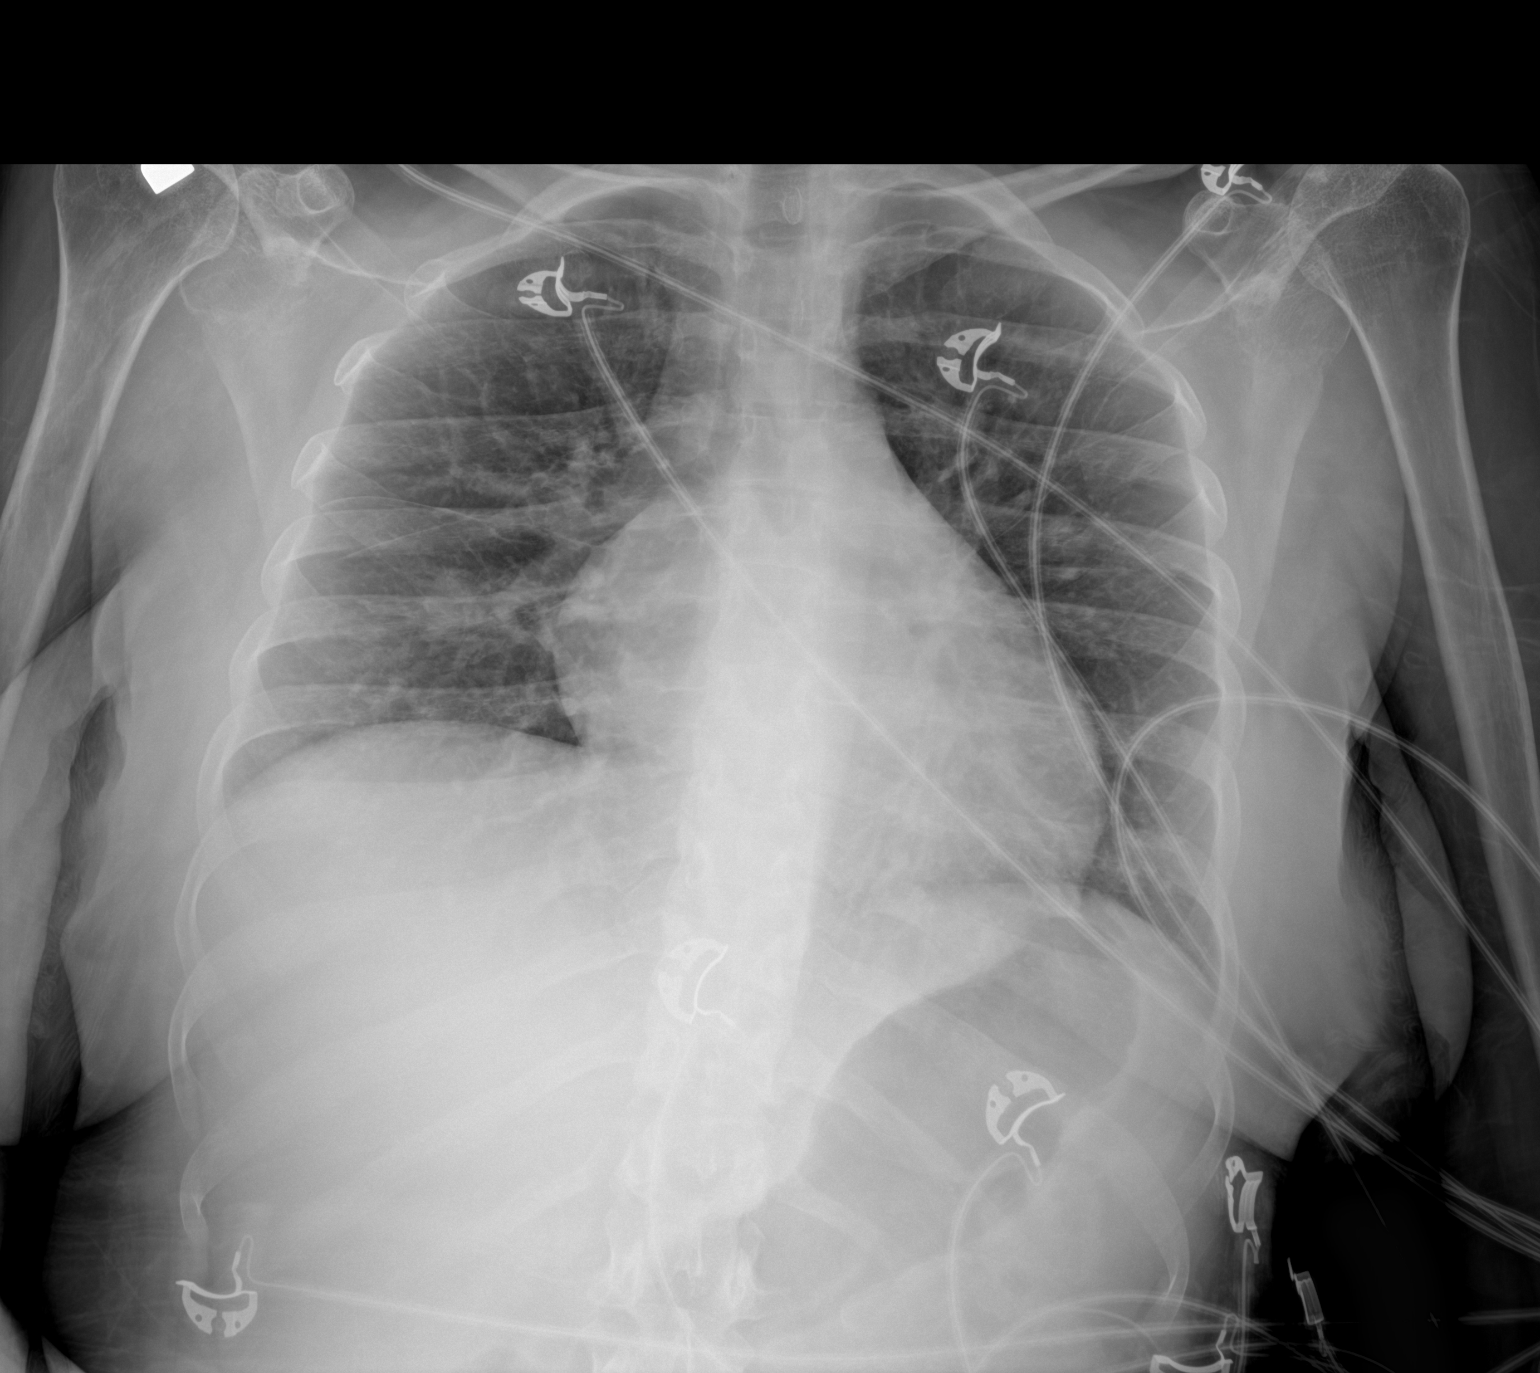

[1 of 1 positions shown; findings below may reference images not displayed]

FINDINGS: Unchanged cardiomediastinal silhouette. There is a small right
midlung opacity new from prior exam. No large pleural effusion. No
visible pneumothorax. No acute osseous abnormality.
IMPRESSION: Small right midlung opacity new from prior exam, could be developing
infection.

## 2022-06-09 ENCOUNTER — Emergency Department: Payer: Medicaid Other

## 2022-06-09 ENCOUNTER — Other Ambulatory Visit: Payer: Self-pay

## 2022-06-09 ENCOUNTER — Inpatient Hospital Stay
Admission: EM | Admit: 2022-06-09 | Discharge: 2022-07-09 | DRG: 643 | Disposition: A | Discharge status: Home / Self Care | Payer: Medicaid Other | Attending: Internal Medicine | Admitting: Internal Medicine

## 2022-06-09 DIAGNOSIS — I5042 Chronic combined systolic (congestive) and diastolic (congestive) heart failure: Secondary | ICD-10-CM | POA: Diagnosis not present

## 2022-06-09 DIAGNOSIS — Z934 Other artificial openings of gastrointestinal tract status: Secondary | ICD-10-CM

## 2022-06-09 DIAGNOSIS — E1043 Type 1 diabetes mellitus with diabetic autonomic (poly)neuropathy: Secondary | ICD-10-CM | POA: Diagnosis not present

## 2022-06-09 DIAGNOSIS — Z681 Body mass index (BMI) 19 or less, adult: Secondary | ICD-10-CM | POA: Diagnosis not present

## 2022-06-09 DIAGNOSIS — Z98891 History of uterine scar from previous surgery: Secondary | ICD-10-CM

## 2022-06-09 DIAGNOSIS — Z794 Long term (current) use of insulin: Secondary | ICD-10-CM

## 2022-06-09 DIAGNOSIS — N179 Acute kidney failure, unspecified: Secondary | ICD-10-CM | POA: Diagnosis present

## 2022-06-09 DIAGNOSIS — E1042 Type 1 diabetes mellitus with diabetic polyneuropathy: Secondary | ICD-10-CM | POA: Diagnosis present

## 2022-06-09 DIAGNOSIS — R571 Hypovolemic shock: Secondary | ICD-10-CM | POA: Diagnosis present

## 2022-06-09 DIAGNOSIS — Z597 Insufficient social insurance and welfare support: Secondary | ICD-10-CM

## 2022-06-09 DIAGNOSIS — D638 Anemia in other chronic diseases classified elsewhere: Secondary | ICD-10-CM | POA: Diagnosis present

## 2022-06-09 DIAGNOSIS — I130 Hypertensive heart and chronic kidney disease with heart failure and stage 1 through stage 4 chronic kidney disease, or unspecified chronic kidney disease: Secondary | ICD-10-CM | POA: Diagnosis not present

## 2022-06-09 DIAGNOSIS — Z833 Family history of diabetes mellitus: Secondary | ICD-10-CM

## 2022-06-09 DIAGNOSIS — R636 Underweight: Secondary | ICD-10-CM | POA: Diagnosis present

## 2022-06-09 DIAGNOSIS — D509 Iron deficiency anemia, unspecified: Secondary | ICD-10-CM | POA: Diagnosis not present

## 2022-06-09 DIAGNOSIS — E162 Hypoglycemia, unspecified: Secondary | ICD-10-CM | POA: Diagnosis present

## 2022-06-09 DIAGNOSIS — N1832 Chronic kidney disease, stage 3b: Secondary | ICD-10-CM | POA: Diagnosis present

## 2022-06-09 DIAGNOSIS — J019 Acute sinusitis, unspecified: Secondary | ICD-10-CM | POA: Diagnosis present

## 2022-06-09 DIAGNOSIS — E43 Unspecified severe protein-calorie malnutrition: Secondary | ICD-10-CM | POA: Diagnosis not present

## 2022-06-09 DIAGNOSIS — F32A Depression, unspecified: Secondary | ICD-10-CM | POA: Diagnosis present

## 2022-06-09 DIAGNOSIS — R627 Adult failure to thrive: Secondary | ICD-10-CM | POA: Diagnosis present

## 2022-06-09 DIAGNOSIS — D696 Thrombocytopenia, unspecified: Secondary | ICD-10-CM | POA: Diagnosis not present

## 2022-06-09 DIAGNOSIS — Z9151 Personal history of suicidal behavior: Secondary | ICD-10-CM

## 2022-06-09 DIAGNOSIS — N189 Chronic kidney disease, unspecified: Secondary | ICD-10-CM | POA: Diagnosis not present

## 2022-06-09 DIAGNOSIS — E2740 Unspecified adrenocortical insufficiency: Secondary | ICD-10-CM | POA: Diagnosis not present

## 2022-06-09 DIAGNOSIS — G9341 Metabolic encephalopathy: Secondary | ICD-10-CM | POA: Diagnosis not present

## 2022-06-09 DIAGNOSIS — E875 Hyperkalemia: Secondary | ICD-10-CM | POA: Diagnosis present

## 2022-06-09 DIAGNOSIS — F419 Anxiety disorder, unspecified: Secondary | ICD-10-CM | POA: Diagnosis present

## 2022-06-09 DIAGNOSIS — Z79899 Other long term (current) drug therapy: Secondary | ICD-10-CM

## 2022-06-09 DIAGNOSIS — R7989 Other specified abnormal findings of blood chemistry: Secondary | ICD-10-CM | POA: Diagnosis not present

## 2022-06-09 DIAGNOSIS — I9581 Postprocedural hypotension: Secondary | ICD-10-CM | POA: Diagnosis not present

## 2022-06-09 DIAGNOSIS — Z8659 Personal history of other mental and behavioral disorders: Secondary | ICD-10-CM

## 2022-06-09 DIAGNOSIS — E039 Hypothyroidism, unspecified: Secondary | ICD-10-CM | POA: Diagnosis not present

## 2022-06-09 DIAGNOSIS — Z1152 Encounter for screening for COVID-19: Secondary | ICD-10-CM

## 2022-06-09 DIAGNOSIS — F331 Major depressive disorder, recurrent, moderate: Secondary | ICD-10-CM | POA: Diagnosis present

## 2022-06-09 DIAGNOSIS — I951 Orthostatic hypotension: Secondary | ICD-10-CM | POA: Diagnosis not present

## 2022-06-09 DIAGNOSIS — I129 Hypertensive chronic kidney disease with stage 1 through stage 4 chronic kidney disease, or unspecified chronic kidney disease: Secondary | ICD-10-CM | POA: Diagnosis not present

## 2022-06-09 DIAGNOSIS — E1010 Type 1 diabetes mellitus with ketoacidosis without coma: Secondary | ICD-10-CM | POA: Diagnosis present

## 2022-06-09 DIAGNOSIS — R634 Abnormal weight loss: Secondary | ICD-10-CM | POA: Diagnosis not present

## 2022-06-09 DIAGNOSIS — K3184 Gastroparesis: Secondary | ICD-10-CM | POA: Diagnosis present

## 2022-06-09 DIAGNOSIS — R579 Shock, unspecified: Secondary | ICD-10-CM

## 2022-06-09 DIAGNOSIS — D631 Anemia in chronic kidney disease: Secondary | ICD-10-CM | POA: Diagnosis present

## 2022-06-09 DIAGNOSIS — E1011 Type 1 diabetes mellitus with ketoacidosis with coma: Principal | ICD-10-CM

## 2022-06-09 DIAGNOSIS — E10649 Type 1 diabetes mellitus with hypoglycemia without coma: Secondary | ICD-10-CM | POA: Diagnosis not present

## 2022-06-09 DIAGNOSIS — Z91148 Patient's other noncompliance with medication regimen for other reason: Secondary | ICD-10-CM

## 2022-06-09 DIAGNOSIS — K219 Gastro-esophageal reflux disease without esophagitis: Secondary | ICD-10-CM | POA: Diagnosis present

## 2022-06-09 DIAGNOSIS — E1022 Type 1 diabetes mellitus with diabetic chronic kidney disease: Secondary | ICD-10-CM | POA: Diagnosis present

## 2022-06-09 DIAGNOSIS — G4700 Insomnia, unspecified: Secondary | ICD-10-CM | POA: Diagnosis present

## 2022-06-09 DIAGNOSIS — I959 Hypotension, unspecified: Secondary | ICD-10-CM | POA: Diagnosis present

## 2022-06-09 DIAGNOSIS — G928 Other toxic encephalopathy: Secondary | ICD-10-CM | POA: Diagnosis present

## 2022-06-09 DIAGNOSIS — Z9851 Tubal ligation status: Secondary | ICD-10-CM

## 2022-06-09 DIAGNOSIS — E1110 Type 2 diabetes mellitus with ketoacidosis without coma: Secondary | ICD-10-CM | POA: Diagnosis present

## 2022-06-09 DIAGNOSIS — I9589 Other hypotension: Secondary | ICD-10-CM | POA: Diagnosis not present

## 2022-06-09 DIAGNOSIS — Z79890 Hormone replacement therapy (postmenopausal): Secondary | ICD-10-CM

## 2022-06-09 DIAGNOSIS — R112 Nausea with vomiting, unspecified: Secondary | ICD-10-CM | POA: Diagnosis not present

## 2022-06-09 DIAGNOSIS — D649 Anemia, unspecified: Secondary | ICD-10-CM | POA: Diagnosis not present

## 2022-06-09 LAB — ETHANOL: Alcohol, Ethyl (B): <10 mg/dL (ref <10)

## 2022-06-09 LAB — PROTIME-INR
INR: 1.4 — ABNORMAL HIGH (ref 0.8–1.2)
Prothrombin Time: 17.2 seconds — ABNORMAL HIGH (ref 11.4–15.2)

## 2022-06-09 LAB — URINALYSIS, ROUTINE W REFLEX MICROSCOPIC
Bacteria, UA: NONE SEEN
Bilirubin Urine: NEGATIVE
Glucose, UA: >=500 mg/dL — AB
Ketones, ur: 80 mg/dL — AB
Leukocytes,Ua: NEGATIVE
Nitrite: NEGATIVE
Protein, ur: NEGATIVE mg/dL
Specific Gravity, Urine: 1.016 (ref 1.005–1.030)
pH: 5 (ref 5.0–8.0)

## 2022-06-09 LAB — BLOOD GAS, ARTERIAL
Acid-base deficit: 10.2 mmol/L — ABNORMAL HIGH (ref 0.0–2.0)
Bicarbonate: 14.4 mmol/L — ABNORMAL LOW (ref 20.0–28.0)
O2 Saturation: 99.8 %
Patient temperature: 37
pCO2 arterial: 28 mmHg — ABNORMAL LOW (ref 32–48)
pH, Arterial: 7.32 — ABNORMAL LOW (ref 7.35–7.45)
pO2, Arterial: 142 mmHg — ABNORMAL HIGH (ref 83–108)

## 2022-06-09 LAB — COMPREHENSIVE METABOLIC PANEL
ALT: 15 U/L (ref 0–44)
ALT: 13 U/L (ref 0–44)
AST: 23 U/L (ref 15–41)
AST: 22 U/L (ref 15–41)
Albumin: 4.4 g/dL (ref 3.5–5.0)
Albumin: 3.4 g/dL — ABNORMAL LOW (ref 3.5–5.0)
Alkaline Phosphatase: 101 U/L (ref 38–126)
Alkaline Phosphatase: 78 U/L (ref 38–126)
Anion gap: 27 — ABNORMAL HIGH (ref 5–15)
Anion gap: 21 — ABNORMAL HIGH (ref 5–15)
BUN: 63 mg/dL — ABNORMAL HIGH (ref 6–20)
BUN: 60 mg/dL — ABNORMAL HIGH (ref 6–20)
CO2: 10 mmol/L — ABNORMAL LOW (ref 22–32)
CO2: 13 mmol/L — ABNORMAL LOW (ref 22–32)
Calcium: 9.2 mg/dL (ref 8.9–10.3)
Calcium: 8.5 mg/dL — ABNORMAL LOW (ref 8.9–10.3)
Chloride: 95 mmol/L — ABNORMAL LOW (ref 98–111)
Chloride: 100 mmol/L (ref 98–111)
Creatinine, Ser: 2.58 mg/dL — ABNORMAL HIGH (ref 0.44–1.00)
Creatinine, Ser: 2.25 mg/dL — ABNORMAL HIGH (ref 0.44–1.00)
GFR, Estimated: 25 mL/min — ABNORMAL LOW (ref >60)
GFR, Estimated: 29 mL/min — ABNORMAL LOW (ref >60)
Glucose, Bld: 887 mg/dL (ref 70–99)
Glucose, Bld: 736 mg/dL (ref 70–99)
Potassium: 4.9 mmol/L (ref 3.5–5.1)
Potassium: 5 mmol/L (ref 3.5–5.1)
Sodium: 132 mmol/L — ABNORMAL LOW (ref 135–145)
Sodium: 134 mmol/L — ABNORMAL LOW (ref 135–145)
Total Bilirubin: 2.3 mg/dL — ABNORMAL HIGH (ref 0.3–1.2)
Total Bilirubin: 1.8 mg/dL — ABNORMAL HIGH (ref 0.3–1.2)
Total Protein: 6.9 g/dL (ref 6.5–8.1)
Total Protein: 5.6 g/dL — ABNORMAL LOW (ref 6.5–8.1)

## 2022-06-09 LAB — IRON AND TIBC
Iron: 99 ug/dL (ref 28–170)
Saturation Ratios: 68 % — ABNORMAL HIGH (ref 10.4–31.8)
TIBC: 146 ug/dL — ABNORMAL LOW (ref 250–450)
UIBC: 47 ug/dL

## 2022-06-09 LAB — CBC
HCT: 19.1 % — ABNORMAL LOW (ref 36.0–46.0)
Hemoglobin: 7 g/dL — ABNORMAL LOW (ref 12.0–15.0)
MCH: 30 pg (ref 26.0–34.0)
MCHC: 36.6 g/dL — ABNORMAL HIGH (ref 30.0–36.0)
MCV: 82 fL (ref 80.0–100.0)
Platelets: 118 10*3/uL — ABNORMAL LOW (ref 150–400)
RBC: 2.33 MIL/uL — ABNORMAL LOW (ref 3.87–5.11)
RDW: 12.4 % (ref 11.5–15.5)
WBC: 9.1 10*3/uL (ref 4.0–10.5)
nRBC: 0 % (ref 0.0–0.2)

## 2022-06-09 LAB — BASIC METABOLIC PANEL
Anion gap: 16 — ABNORMAL HIGH (ref 5–15)
BUN: 53 mg/dL — ABNORMAL HIGH (ref 6–20)
CO2: 18 mmol/L — ABNORMAL LOW (ref 22–32)
Calcium: 9 mg/dL (ref 8.9–10.3)
Chloride: 104 mmol/L (ref 98–111)
Creatinine, Ser: 2.17 mg/dL — ABNORMAL HIGH (ref 0.44–1.00)
GFR, Estimated: 30 mL/min — ABNORMAL LOW (ref >60)
Glucose, Bld: 442 mg/dL — ABNORMAL HIGH (ref 70–99)
Potassium: 4.3 mmol/L (ref 3.5–5.1)
Sodium: 138 mmol/L (ref 135–145)

## 2022-06-09 LAB — CBG MONITORING, ED
Glucose-Capillary: 380 mg/dL — ABNORMAL HIGH (ref 70–99)
Glucose-Capillary: 418 mg/dL — ABNORMAL HIGH (ref 70–99)
Glucose-Capillary: 485 mg/dL — ABNORMAL HIGH (ref 70–99)
Glucose-Capillary: 515 mg/dL (ref 70–99)
Glucose-Capillary: 584 mg/dL (ref 70–99)
Glucose-Capillary: >600 mg/dL (ref 70–99)
Glucose-Capillary: >600 mg/dL (ref 70–99)
Glucose-Capillary: >600 mg/dL (ref 70–99)

## 2022-06-09 LAB — CBC WITH DIFFERENTIAL/PLATELET
Abs Immature Granulocytes: 0.02 10*3/uL (ref 0.00–0.07)
Basophils Absolute: 0.1 10*3/uL (ref 0.0–0.1)
Basophils Relative: 1 %
Eosinophils Absolute: 0.1 10*3/uL (ref 0.0–0.5)
Eosinophils Relative: 1 %
HCT: 23.4 % — ABNORMAL LOW (ref 36.0–46.0)
Hemoglobin: 8.2 g/dL — ABNORMAL LOW (ref 12.0–15.0)
Immature Granulocytes: 0 %
Lymphocytes Relative: 16 %
Lymphs Abs: 1.6 10*3/uL (ref 0.7–4.0)
MCH: 30.3 pg (ref 26.0–34.0)
MCHC: 35 g/dL (ref 30.0–36.0)
MCV: 86.3 fL (ref 80.0–100.0)
Monocytes Absolute: 0.5 10*3/uL (ref 0.1–1.0)
Monocytes Relative: 5 %
Neutro Abs: 8.1 10*3/uL — ABNORMAL HIGH (ref 1.7–7.7)
Neutrophils Relative %: 77 %
Platelets: 129 10*3/uL — ABNORMAL LOW (ref 150–400)
RBC: 2.71 MIL/uL — ABNORMAL LOW (ref 3.87–5.11)
RDW: 12.3 % (ref 11.5–15.5)
WBC: 10.4 10*3/uL (ref 4.0–10.5)
nRBC: 0 % (ref 0.0–0.2)

## 2022-06-09 LAB — URINE DRUG SCREEN, QUALITATIVE (ARMC ONLY)
Amphetamines, Ur Screen: NOT DETECTED
Barbiturates, Ur Screen: NOT DETECTED
Benzodiazepine, Ur Scrn: NOT DETECTED
Cannabinoid 50 Ng, Ur ~~LOC~~: NOT DETECTED
Cocaine Metabolite,Ur ~~LOC~~: NOT DETECTED
MDMA (Ecstasy)Ur Screen: NOT DETECTED
Methadone Scn, Ur: NOT DETECTED
Opiate, Ur Screen: NOT DETECTED
Phencyclidine (PCP) Ur S: NOT DETECTED
Tricyclic, Ur Screen: NOT DETECTED

## 2022-06-09 LAB — TSH: TSH: 7.397 u[IU]/mL — ABNORMAL HIGH (ref 0.350–4.500)

## 2022-06-09 LAB — CK: Total CK: 20 U/L — ABNORMAL LOW (ref 38–234)

## 2022-06-09 LAB — SALICYLATE LEVEL: Salicylate Lvl: <7 mg/dL — ABNORMAL LOW (ref 7.0–30.0)

## 2022-06-09 LAB — ACETAMINOPHEN LEVEL: Acetaminophen (Tylenol), Serum: <10 ug/mL — ABNORMAL LOW (ref 10–30)

## 2022-06-09 LAB — RESP PANEL BY RT-PCR (RSV, FLU A&B, COVID)  RVPGX2
Influenza A by PCR: NEGATIVE
Influenza B by PCR: NEGATIVE
Resp Syncytial Virus by PCR: NEGATIVE
SARS Coronavirus 2 by RT PCR: NEGATIVE

## 2022-06-09 LAB — BETA-HYDROXYBUTYRIC ACID: Beta-Hydroxybutyric Acid: >8 mmol/L — ABNORMAL HIGH (ref 0.05–0.27)

## 2022-06-09 LAB — TROPONIN I (HIGH SENSITIVITY)
Troponin I (High Sensitivity): 3 ng/L (ref <18)
Troponin I (High Sensitivity): 4 ng/L (ref <18)

## 2022-06-09 LAB — AMMONIA: Ammonia: 47 umol/L — ABNORMAL HIGH (ref 9–35)

## 2022-06-09 LAB — LACTIC ACID, PLASMA
Lactic Acid, Venous: 3.4 mmol/L (ref 0.5–1.9)
Lactic Acid, Venous: 3.9 mmol/L (ref 0.5–1.9)

## 2022-06-09 LAB — MAGNESIUM: Magnesium: 2.5 mg/dL — ABNORMAL HIGH (ref 1.7–2.4)

## 2022-06-09 LAB — PROCALCITONIN: Procalcitonin: 0.26 ng/mL

## 2022-06-09 LAB — LIPASE, BLOOD: Lipase: 31 U/L (ref 11–51)

## 2022-06-09 LAB — T4, FREE: Free T4: 0.63 ng/dL (ref 0.61–1.12)

## 2022-06-09 LAB — HCG, QUANTITATIVE, PREGNANCY: hCG, Beta Chain, Quant, S: 2 m[IU]/mL (ref <5)

## 2022-06-09 LAB — FERRITIN: Ferritin: 1511 ng/mL — ABNORMAL HIGH (ref 11–307)

## 2022-06-09 LAB — PHOSPHORUS: Phosphorus: 6.7 mg/dL — ABNORMAL HIGH (ref 2.5–4.6)

## 2022-06-09 MED ORDER — SODIUM CHLORIDE 0.9 % IV SOLN
2.0000 g | INTRAVENOUS | Status: DC
  Administered 2022-06-09: 2 g via INTRAVENOUS
  Filled 2022-06-09: qty 20

## 2022-06-09 MED ORDER — INSULIN REGULAR(HUMAN) IN NACL 100-0.9 UT/100ML-% IV SOLN
INTRAVENOUS | Status: DC
  Administered 2022-06-09: 3.8 [IU]/h via INTRAVENOUS
  Filled 2022-06-09: qty 100

## 2022-06-09 MED ORDER — SODIUM CHLORIDE 0.9 % IV SOLN
1.0000 g | INTRAVENOUS | Status: DC
  Administered 2022-06-10 – 2022-06-11 (×2): 1 g via INTRAVENOUS
  Filled 2022-06-09: qty 1
  Filled 2022-06-09 (×2): qty 10

## 2022-06-09 MED ORDER — HYDROCORTISONE SOD SUC (PF) 100 MG IJ SOLR
50.0000 mg | Freq: Two times a day (BID) | INTRAMUSCULAR | Status: DC
  Administered 2022-06-09 – 2022-06-12 (×6): 50 mg via INTRAVENOUS
  Filled 2022-06-09 (×5): qty 1
  Filled 2022-06-09: qty 2

## 2022-06-09 MED ORDER — LACTATED RINGERS IV SOLN: INTRAVENOUS | Status: DC

## 2022-06-09 MED ORDER — DEXAMETHASONE SODIUM PHOSPHATE 10 MG/ML IJ SOLN
10.0000 mg | Freq: Once | INTRAMUSCULAR | Status: AC
  Administered 2022-06-09: 10 mg via INTRAVENOUS
  Filled 2022-06-09: qty 1

## 2022-06-09 MED ORDER — DEXTROSE 50 % IV SOLN
0.0000 mL | INTRAVENOUS | Status: DC | PRN
  Administered 2022-06-13 – 2022-06-14 (×2): 50 mL via INTRAVENOUS
  Administered 2022-06-29: 25 mL via INTRAVENOUS
  Filled 2022-06-09 (×3): qty 50

## 2022-06-09 MED ORDER — DEXTROSE 50 % IV SOLN: 0.0000 mL | INTRAVENOUS | Status: DC | PRN

## 2022-06-09 MED ORDER — INSULIN REGULAR(HUMAN) IN NACL 100-0.9 UT/100ML-% IV SOLN
INTRAVENOUS | Status: DC
  Administered 2022-06-09: 4 [IU]/h via INTRAVENOUS

## 2022-06-09 MED ORDER — DEXTROSE IN LACTATED RINGERS 5 % IV SOLN: INTRAVENOUS | Status: DC

## 2022-06-09 MED ORDER — PANTOPRAZOLE SODIUM 40 MG IV SOLR
40.0000 mg | Freq: Once | INTRAVENOUS | Status: AC
  Administered 2022-06-09: 40 mg via INTRAVENOUS
  Filled 2022-06-09: qty 10

## 2022-06-09 MED ORDER — LACTATED RINGERS IV BOLUS
2000.0000 mL | Freq: Once | INTRAVENOUS | Status: AC
  Administered 2022-06-09: 2000 mL via INTRAVENOUS

## 2022-06-09 MED ORDER — POTASSIUM CHLORIDE 10 MEQ/100ML IV SOLN
10.0000 meq | INTRAVENOUS | Status: AC
  Administered 2022-06-09 (×2): 10 meq via INTRAVENOUS
  Filled 2022-06-09 (×2): qty 100

## 2022-06-09 NOTE — ED Notes (Signed)
Patient roomed to 1. Joni Fears, MD and Nira Conn, Charge RN at bedside with patient .

## 2022-06-09 NOTE — Assessment & Plan Note (Addendum)
Appears chronic.  Monitor CBC. Platelets 207     Latest Ref Rng & Units 07/07/2022    5:10 AM 07/06/2022    7:49 PM 07/06/2022    3:56 AM  CBC  WBC 4.0 - 10.5 K/uL 6.9   6.8   Hemoglobin 12.0 - 15.0 g/dL 9.0  9.1  6.9   Hematocrit 36.0 - 46.0 % 27.1  28.0  21.1   Platelets 150 - 400 K/uL 207   214

## 2022-06-09 NOTE — ED Notes (Signed)
Per EDT and RN Ria Comment pt is in and out of it. Sugar checked and reading HI. Charge RN heather informed.

## 2022-06-09 NOTE — ED Provider Notes (Addendum)
[facility] Provider Note    Event Date/Time   First MD Initiated Contact with Patient 06/09/22 1732     (approximate)   History   Chief Complaint: Weakness   HPI  Ashley Camacho is a 32 y.o. female with a history of type 1 diabetes, DKA, CKD, hypertension, gastroparesis, adrenal insufficiency was brought to the ED by her boyfriend due to confusion and generalized weakness that started yesterday.  Patient is not able to interact or answer questions.  Does not express any pain.     Physical Exam   Triage Vital Signs: ED Triage Vitals  Enc Vitals Group     BP 06/09/22 1735 (!) 68/52     Pulse Rate 06/09/22 1735 99     Resp 06/09/22 1740 (!) 22     Temp --      Temp src --      SpO2 06/09/22 1735 100 %     Weight --      Height --      Head Circumference --      Peak Flow --      Pain Score 06/09/22 1728 0     Pain Loc --      Pain Edu? --      Excl. in GC? --     Most recent vital signs: Vitals:   06/09/22 1910 06/09/22 2000  BP: 115/70   Pulse: 94 99  Resp: (!) 21 16  SpO2: 100% 100%    General: GCS = E1V2M5 = 8 CV:  Regular rate and rhythm.  Thready peripheral pulses.  Pale.  No subconjunctival pallor Resp:  Normal effort.  Clear to auscultation bilaterally Abd:  No distention.  Soft nontender Other:  Dry mucous membranes.   ED Results / Procedures / Treatments   Labs (all labs ordered are listed, but only abnormal results are displayed) Labs Reviewed  LACTIC ACID, PLASMA - Abnormal; Notable for the following components:      Result Value   Lactic Acid, Venous 3.4 (*)    All other components within normal limits  COMPREHENSIVE METABOLIC PANEL - Abnormal; Notable for the following components:   Sodium 132 (*)    Chloride 95 (*)    CO2 10 (*)    Glucose, Bld 887 (*)    BUN 63 (*)    Creatinine, Ser 2.58 (*)    Total Bilirubin 2.3 (*)    GFR, Estimated 25 (*)    Anion gap 27 (*)    All other components within normal  limits  CBC WITH DIFFERENTIAL/PLATELET - Abnormal; Notable for the following components:   RBC 2.71 (*)    Hemoglobin 8.2 (*)    HCT 23.4 (*)    Platelets 129 (*)    Neutro Abs 8.1 (*)    All other components within normal limits  AMMONIA - Abnormal; Notable for the following components:   Ammonia 47 (*)    All other components within normal limits  ACETAMINOPHEN LEVEL - Abnormal; Notable for the following components:   Acetaminophen (Tylenol), Serum <10 (*)    All other components within normal limits  SALICYLATE LEVEL - Abnormal; Notable for the following components:   Salicylate Lvl <7.0 (*)    All other components within normal limits  IRON AND TIBC - Abnormal; Notable for the following components:   TIBC 146 (*)    Saturation Ratios 68 (*)    All other components within normal limits  FERRITIN - Abnormal;  Notable for the following components:   Ferritin 1,511 (*)    All other components within normal limits  TSH - Abnormal; Notable for the following components:   TSH 7.397 (*)    All other components within normal limits  MAGNESIUM - Abnormal; Notable for the following components:   Magnesium 2.5 (*)    All other components within normal limits  PHOSPHORUS - Abnormal; Notable for the following components:   Phosphorus 6.7 (*)    All other components within normal limits  CBG MONITORING, ED - Abnormal; Notable for the following components:   Glucose-Capillary >600 (*)    All other components within normal limits  CBG MONITORING, ED - Abnormal; Notable for the following components:   Glucose-Capillary >600 (*)    All other components within normal limits  RESP PANEL BY RT-PCR (RSV, FLU A&B, COVID)  RVPGX2  CULTURE, BLOOD (ROUTINE X 2)  CULTURE, BLOOD (ROUTINE X 2)  URINE CULTURE  LIPASE, BLOOD  PROCALCITONIN  HCG, QUANTITATIVE, PREGNANCY  URINALYSIS, ROUTINE W REFLEX MICROSCOPIC  LACTIC ACID, PLASMA  BLOOD GAS, VENOUS  URINE DRUG SCREEN, QUALITATIVE ([facility] ONLY)   COMPREHENSIVE METABOLIC PANEL  URINALYSIS, COMPLETE (UACMP) WITH MICROSCOPIC  PROTIME-INR  T4, FREE  PH, GASTRIC FLUID (GASTROCCULT CARD)  ETHANOL  POC URINE PREG, ED  TYPE AND SCREEN  TROPONIN I (HIGH SENSITIVITY)  TROPONIN I (HIGH SENSITIVITY)     EKG Interpreted by me Sinus rhythm rate of 97.  Normal axis, normal intervals.  Poor R wave progression.  Normal ST segments and T waves.  No ischemic changes.   RADIOLOGY Chest x-ray interpreted by me, appears normal.  Radiology report reviewed   PROCEDURES:  .Critical Care  Performed by: Carrie Mew, MD Authorized by: Carrie Mew, MD   Critical care provider statement:    Critical care time (minutes):  35   Critical care time was exclusive of:  Separately billable procedures and treating other patients   Critical care was necessary to treat or prevent imminent or life-threatening deterioration of the following conditions:  Sepsis, circulatory failure, CNS failure or compromise, dehydration, renal failure, shock, endocrine crisis and metabolic crisis   Critical care was time spent personally by me on the following activities:  Discussions with consultants, evaluation of patient's response to treatment, examination of patient, obtaining history from patient or surrogate, ordering and performing treatments and interventions, ordering and review of laboratory studies, ordering and review of radiographic studies, pulse oximetry, re-evaluation of patient's condition, review of old charts, blood draw for specimens and development of treatment plan with patient or surrogate   Care discussed with: admitting provider   Comments:        .1-3 Lead EKG Interpretation  Performed by: Carrie Mew, MD Authorized by: Carrie Mew, MD     Interpretation: normal     ECG rate:  90   ECG rate assessment: normal     Rhythm: sinus rhythm     Ectopy: none     Conduction: normal   Comments:        Angiocath  insertion  Date/Time: 06/09/2022 7:10 PM  Performed by: Carrie Mew, MD Authorized by: Carrie Mew, MD  Consent: The procedure was performed in an emergent situation. Preparation: Patient was prepped and draped in the usual sterile fashion. Local anesthesia used: no  Anesthesia: Local anesthesia used: no  Sedation: Patient sedated: no  Patient tolerance: patient tolerated the procedure well with no immediate complications Comments: [place] visualization, 1 attempted in L brachial, unsuccessful. EBL  0.      MEDICATIONS ORDERED IN ED: Medications  cefTRIAXone (ROCEPHIN) 2 g in sodium chloride 0.9 % 100 mL IVPB (0 g Intravenous Stopped 06/09/22 1821)  insulin regular, human (MYXREDLIN) 100 units/ 100 mL infusion (3.8 Units/hr Intravenous New Bag/Given 06/09/22 2003)  lactated ringers infusion (has no administration in time range)  dextrose 5 % in lactated ringers infusion (0 mLs Intravenous Hold 06/09/22 1902)  dextrose 50 % solution 0-50 mL (has no administration in time range)  potassium chloride 10 mEq in 100 mL IVPB (has no administration in time range)  lactated ringers bolus 2,000 mL (2,000 mLs Intravenous New Bag/Given 06/09/22 1805)  pantoprazole (PROTONIX) injection 40 mg (40 mg Intravenous Given 06/09/22 1934)  dexamethasone (DECADRON) injection 10 mg (10 mg Intravenous Given 06/09/22 1933)     IMPRESSION / MDM / ASSESSMENT AND PLAN / ED COURSE  I reviewed the triage vital signs and the nursing notes.                              Differential diagnosis includes, but is not limited to, dehydration, electrolyte abnormality, anemia, DKA, UTI, pneumonia, viral illness, sepsis  Patient's presentation is most consistent with acute presentation with potential threat to life or bodily function.     Clinical Course as of 06/09/22 2010  Wed Jun 09, 2022  1754 Pt p/w shock with hypotension, AMS. Highest suspicion for DKA, electrolyte abnormality, dehydration,  intoxication.  Will workup for sepsis, toxic ingestion, endocrine crisis.  BP improved to 90/50 with IVF bolus initiated.  [PS]  1909 Last blood pressure 118/55.  Heart rate 90.  Chemistry panel shows profound metabolic acidosis with a blood sugar of 900, consistent with DKA.  Hemoglobin is 8, creatinine is at baseline CKD.  Will give IV Protonix, Decadron for her history of adrenal insufficiency.  Obtain CT head and plan to admit. [PS]    Clinical Course User Index [PS] Carrie Mew, MD     ----------------------------------------- 8:10 PM on 06/09/2022 ----------------------------------------- CT head unremarkable.  Case discussed with ICU team for admission.   FINAL CLINICAL IMPRESSION(S) / ED DIAGNOSES   Final diagnoses:  Type 1 diabetes mellitus with ketoacidotic coma (HCC)  Hypovolemic shock (HCC)  Stage 3b chronic kidney disease ([place])     Rx / DC Orders   ED Discharge Orders     None        Note:  This document was prepared using Dragon voice recognition software and may include unintentional dictation errors.   Carrie Mew, MD 06/09/22 1911    Carrie Mew, MD 06/09/22 2010

## 2022-06-09 NOTE — H&P (Signed)
History and Physical    Chief Complaint: Adrenal insufficiency  Altered mental status   HISTORY OF PRESENT ILLNESS: Ashley Camacho is an 32 y.o. female brought to the emergency room for weakness vomiting lethargy.  Per report patient had not been eating and has been sleeping a lot. Patient has a history of postpartum depression and has stopped taking medications for her depression. On arrival patient was found to be hypotensive hypoglycemic and acidotic. Patient has a history of adrenal insufficiency and her presentation is still very suspicious for crisis.  Patient is unable to provide any HPI she is lethargic and somnolent. While examining the patient she has her head turned to the left side and her hands on her abdomen as if she is in pain and patient is vomiting.  Her vomitus is coffee-ground in color. We have sent Gastroccult from the emergency room and has not resulted yet but patient has a GI bleed from presentation and her anemia. We will repeat CBC as well and follow type and screen.    Pt has  Past Medical History:  Diagnosis Date   Adrenal insufficiency (HCC)    Anemia    Gastroparesis    Hypertension    Type 1 diabetes (HCC)      Review of Systems  Unable to perform ROS: Mental status change   No Known Allergies Past Surgical History:  Procedure Laterality Date   BIOPSY  01/14/2021   Procedure: BIOPSY;  Surgeon: Tressia Danas, MD;  Location: [facility] ENDOSCOPY;  Service: Gastroenterology;;   CESAREAN SECTION     CESAREAN SECTION WITH BILATERAL TUBAL LIGATION N/A 02/17/2021   Procedure: CESAREAN SECTION WITH BILATERAL TUBAL LIGATION;  Surgeon: [name], DO;  Location: [facility] LD ORS;  Service: Obstetrics;  Laterality: N/A;   ESOPHAGOGASTRODUODENOSCOPY (EGD) WITH PROPOFOL N/A 01/14/2021   Procedure: ESOPHAGOGASTRODUODENOSCOPY (EGD) WITH PROPOFOL;  Surgeon: Tressia Danas, MD;  Location: [facility] ENDOSCOPY;  Service: Gastroenterology;  Laterality: N/A;   MOUTH SURGERY          MEDICATIONS: Current Outpatient Medications  Medication Instructions   Acetaminophen Extra Strength 1,000 mg, Oral, Every 6 hours PRN   bumetanide (BUMEX) 2 mg, 2 times daily   carvedilol (COREG) 12.5 mg, 2 times daily   hydrocortisone (CORTEF) 10-20 mg, Oral, 2 times daily, Take 2 tablets (20 mg) in the morning and 1 tablet (10 mg) in the evening.   Insulin Syringe-Needle U-100 (BD INSULIN SYRINGE U/F) 31G X 5/16" 0.3 ML MISC Use as directed 5 times daily with Lantus and Novolog.   Lantus 4 Units, Subcutaneous, 2 times daily, Inject 4 units in the morning and 4 units in the evening   levothyroxine (SYNTHROID) 25 mcg, Oral, Daily before breakfast   NovoLOG 2 Units, Subcutaneous, 3 times daily with meals   pantoprazole (PROTONIX) 40 MG tablet 1 tablet, 2 times daily     sodium chloride   Intravenous Once   hydrocortisone sod succinate (SOLU-CORTEF) inj  50 mg Intravenous Q12H   prochlorperazine  10 mg Intravenous Once     cefTRIAXone (ROCEPHIN)  IV     dextrose 5% lactated ringers Stopped (06/09/22 1902)   dextrose 5% lactated ringers Stopped (06/09/22 2218)   insulin 3.4 Units/hr (06/09/22 2307)   lactated ringers 150 mL/hr at 06/09/22 2230    dextrose    ED Course: Pt in Ed is again lethargic not following commands hypotensive. Initially recommended that patient be admitted to the ICU for management of DKA, suspected adrenal insufficiency, GI bleed.  ICU provider evaluated patient patient declined for ICU placement. We will admit patient to stepdown unit and continue patient on her insulin drip and continue patient on hydrocortisone. Close monitoring of electrolytes and replacement therapy. Vitals:   06/09/22 2228 06/09/22 2257 06/09/22 2300 06/09/22 2330  BP: 110/60   121/73  Pulse:   (!) 102 (!) 103  Resp:   (!) 21 16  Temp:  (!) 96.6 F (35.9 C) (!) 96.6 F (35.9 C) (!) 97 F (36.1 C)  TempSrc:  Bladder    SpO2:   100% 100%  Blood work shows metabolic  acidosis with an anion gap of 21 initially and a serum bicarb of 13 serum creatinine of 2.25. Repeat CMP after insulin drip has been initiated bicarb is improved to 18, glucose has improved to 442 from 736, anion gap has improved to 16, Serum creatinine has improved to 217 with IV fluids and hydration.  Blood pressure has come up from the initial systolic presentation of 60.   No intake/output data recorded. SpO2: 100 % Blood work in ed shows: Results for orders placed or performed during the hospital encounter of 06/09/22 (from the past 24 hour(s))  CBG monitoring, ED     Status: Abnormal   Collection Time: 06/09/22  5:31 PM  Result Value Ref Range   Glucose-Capillary >600 (HH) 70 - 99 mg/dL  Lactic acid, plasma     Status: Abnormal   Collection Time: 06/09/22  5:44 PM  Result Value Ref Range   Lactic Acid, Venous 3.4 (HH) 0.5 - 1.9 mmol/L  Comprehensive metabolic panel     Status: Abnormal   Collection Time: 06/09/22  5:44 PM  Result Value Ref Range   Sodium 132 (L) 135 - 145 mmol/L   Potassium 4.9 3.5 - 5.1 mmol/L   Chloride 95 (L) 98 - 111 mmol/L   CO2 10 (L) 22 - 32 mmol/L   Glucose, Bld 887 (HH) 70 - 99 mg/dL   BUN 63 (H) 6 - 20 mg/dL   Creatinine, Ser 2.58 (H) 0.44 - 1.00 mg/dL   Calcium 9.2 8.9 - 10.3 mg/dL   Total Protein 6.9 6.5 - 8.1 g/dL   Albumin 4.4 3.5 - 5.0 g/dL   AST 23 15 - 41 U/L   ALT 15 0 - 44 U/L   Alkaline Phosphatase 101 38 - 126 U/L   Total Bilirubin 2.3 (H) 0.3 - 1.2 mg/dL   GFR, Estimated 25 (L) >60 mL/min   Anion gap 27 (H) 5 - 15  CBC with Differential     Status: Abnormal   Collection Time: 06/09/22  5:44 PM  Result Value Ref Range   WBC 10.4 4.0 - 10.5 K/uL   RBC 2.71 (L) 3.87 - 5.11 MIL/uL   Hemoglobin 8.2 (L) 12.0 - 15.0 g/dL   HCT 23.4 (L) 36.0 - 46.0 %   MCV 86.3 80.0 - 100.0 fL   MCH 30.3 26.0 - 34.0 pg   MCHC 35.0 30.0 - 36.0 g/dL   RDW 12.3 11.5 - 15.5 %   Platelets 129 (L) 150 - 400 K/uL   nRBC 0.0 0.0 - 0.2 %   Neutrophils  Relative % 77 %   Neutro Abs 8.1 (H) 1.7 - 7.7 K/uL   Lymphocytes Relative 16 %   Lymphs Abs 1.6 0.7 - 4.0 K/uL   Monocytes Relative 5 %   Monocytes Absolute 0.5 0.1 - 1.0 K/uL   Eosinophils Relative 1 %   Eosinophils Absolute 0.1 0.0 -  0.5 K/uL   Basophils Relative 1 %   Basophils Absolute 0.1 0.0 - 0.1 K/uL   Immature Granulocytes 0 %   Abs Immature Granulocytes 0.02 0.00 - 0.07 K/uL  Lipase, blood     Status: None   Collection Time: 06/09/22  5:44 PM  Result Value Ref Range   Lipase 31 11 - 51 U/L  Troponin I (High Sensitivity)     Status: None   Collection Time: 06/09/22  5:44 PM  Result Value Ref Range   Troponin I (High Sensitivity) 3 <18 ng/L  Procalcitonin - Baseline     Status: None   Collection Time: 06/09/22  5:44 PM  Result Value Ref Range   Procalcitonin 0.26 ng/mL  Acetaminophen level     Status: Abnormal   Collection Time: 06/09/22  5:44 PM  Result Value Ref Range   Acetaminophen (Tylenol), Serum <10 (L) 10 - 30 ug/mL  Salicylate level     Status: Abnormal   Collection Time: 06/09/22  5:44 PM  Result Value Ref Range   Salicylate Lvl <7.0 (L) 7.0 - 30.0 mg/dL  Iron and TIBC     Status: Abnormal   Collection Time: 06/09/22  5:44 PM  Result Value Ref Range   Iron 99 28 - 170 ug/dL   TIBC 161146 (L) 096250 - 045450 ug/dL   Saturation Ratios 68 (H) 10.4 - 31.8 %   UIBC 47 ug/dL  Ferritin (Iron Binding Protein)     Status: Abnormal   Collection Time: 06/09/22  5:44 PM  Result Value Ref Range   Ferritin 1,511 (H) 11 - 307 ng/mL  TSH     Status: Abnormal   Collection Time: 06/09/22  5:44 PM  Result Value Ref Range   TSH 7.397 (H) 0.350 - 4.500 uIU/mL  Magnesium     Status: Abnormal   Collection Time: 06/09/22  5:44 PM  Result Value Ref Range   Magnesium 2.5 (H) 1.7 - 2.4 mg/dL  Phosphorus     Status: Abnormal   Collection Time: 06/09/22  5:44 PM  Result Value Ref Range   Phosphorus 6.7 (H) 2.5 - 4.6 mg/dL  hCG, quantitative, pregnancy     Status: None    Collection Time: 06/09/22  5:44 PM  Result Value Ref Range   hCG, Beta Chain, Quant, S 2 <5 mIU/mL  Protime-INR     Status: Abnormal   Collection Time: 06/09/22  5:44 PM  Result Value Ref Range   Prothrombin Time 17.2 (H) 11.4 - 15.2 seconds   INR 1.4 (H) 0.8 - 1.2  T4, free     Status: None   Collection Time: 06/09/22  5:44 PM  Result Value Ref Range   Free T4 0.63 0.61 - 1.12 ng/dL  Ammonia     Status: Abnormal   Collection Time: 06/09/22  5:54 PM  Result Value Ref Range   Ammonia 47 (H) 9 - 35 umol/L  CBG monitoring, ED     Status: Abnormal   Collection Time: 06/09/22  7:49 PM  Result Value Ref Range   Glucose-Capillary >600 (HH) 70 - 99 mg/dL  Beta-hydroxybutyric acid     Status: Abnormal   Collection Time: 06/09/22  8:28 PM  Result Value Ref Range   Beta-Hydroxybutyric Acid >8.00 (H) 0.05 - 0.27 mmol/L  CK     Status: Abnormal   Collection Time: 06/09/22  8:28 PM  Result Value Ref Range   Total CK 20 (L) 38 - 234 U/L  Resp panel by  RT-PCR (RSV, Flu A&B, Covid) Anterior Nasal Swab     Status: None   Collection Time: 06/09/22  8:30 PM   Specimen: Anterior Nasal Swab  Result Value Ref Range   SARS Coronavirus 2 by RT PCR NEGATIVE NEGATIVE   Influenza A by PCR NEGATIVE NEGATIVE   Influenza B by PCR NEGATIVE NEGATIVE   Resp Syncytial Virus by PCR NEGATIVE NEGATIVE  Lactic acid, plasma     Status: Abnormal   Collection Time: 06/09/22  8:30 PM  Result Value Ref Range   Lactic Acid, Venous 3.9 (HH) 0.5 - 1.9 mmol/L  Troponin I (High Sensitivity)     Status: None   Collection Time: 06/09/22  8:30 PM  Result Value Ref Range   Troponin I (High Sensitivity) 4 <18 ng/L  Comprehensive metabolic panel     Status: Abnormal   Collection Time: 06/09/22  8:30 PM  Result Value Ref Range   Sodium 134 (L) 135 - 145 mmol/L   Potassium 5.0 3.5 - 5.1 mmol/L   Chloride 100 98 - 111 mmol/L   CO2 13 (L) 22 - 32 mmol/L   Glucose, Bld 736 (HH) 70 - 99 mg/dL   BUN 60 (H) 6 - 20 mg/dL    Creatinine, Ser 7.82 (H) 0.44 - 1.00 mg/dL   Calcium 8.5 (L) 8.9 - 10.3 mg/dL   Total Protein 5.6 (L) 6.5 - 8.1 g/dL   Albumin 3.4 (L) 3.5 - 5.0 g/dL   AST 22 15 - 41 U/L   ALT 13 0 - 44 U/L   Alkaline Phosphatase 78 38 - 126 U/L   Total Bilirubin 1.8 (H) 0.3 - 1.2 mg/dL   GFR, Estimated 29 (L) >60 mL/min   Anion gap 21 (H) 5 - 15  Type and screen     Status: None (Preliminary result)   Collection Time: 06/09/22  8:30 PM  Result Value Ref Range   ABO/RH(D) A POS    Antibody Screen NEG    Sample Expiration      06/12/2022,2359 Performed at [facility], [address], [place], [place] [address]    Unit Number [id number]    Blood Component Type RED CELLS,LR    Unit division 00    Status of Unit ALLOCATED    Transfusion Status OK TO TRANSFUSE    Crossmatch Result Compatible   Ethanol     Status: None   Collection Time: 06/09/22  8:30 PM  Result Value Ref Range   Alcohol, Ethyl (B) <10 <10 mg/dL  CBG monitoring, ED     Status: Abnormal   Collection Time: 06/09/22  8:38 PM  Result Value Ref Range   Glucose-Capillary >600 (HH) 70 - 99 mg/dL  Blood gas, arterial     Status: Abnormal   Collection Time: 06/09/22  9:05 PM  Result Value Ref Range   pH, Arterial 7.32 (L) 7.35 - 7.45   pCO2 arterial 28 (L) 32 - 48 mmHg   pO2, Arterial 142 (H) 83 - 108 mmHg   Bicarbonate 14.4 (L) 20.0 - 28.0 mmol/L   Acid-base deficit 10.2 (H) 0.0 - 2.0 mmol/L   O2 Saturation 99.8 %   Patient temperature 37.0    Collection site LEFT RADIAL    Allens test (pass/fail) PASS PASS  CBG monitoring, ED     Status: Abnormal   Collection Time: 06/09/22  9:10 PM  Result Value Ref Range   Glucose-Capillary 584 (HH) 70 - 99 mg/dL  CBG monitoring, ED  Status: Abnormal   Collection Time: 06/09/22  9:54 PM  Result Value Ref Range   Glucose-Capillary 515 (HH) 70 - 99 mg/dL   Comment 1 Document in Chart   CBG monitoring, ED     Status: Abnormal   Collection Time: 06/09/22 10:32 PM  Result  Value Ref Range   Glucose-Capillary 485 (H) 70 - 99 mg/dL  Urinalysis, Routine w reflex microscopic     Status: Abnormal   Collection Time: 06/09/22 10:59 PM  Result Value Ref Range   Color, Urine STRAW (A) YELLOW   APPearance CLEAR (A) CLEAR   Specific Gravity, Urine 1.016 1.005 - 1.030   pH 5.0 5.0 - 8.0   Glucose, UA >=500 (A) NEGATIVE mg/dL   Hgb urine dipstick SMALL (A) NEGATIVE   Bilirubin Urine NEGATIVE NEGATIVE   Ketones, ur 80 (A) NEGATIVE mg/dL   Protein, ur NEGATIVE NEGATIVE mg/dL   Nitrite NEGATIVE NEGATIVE   Leukocytes,Ua NEGATIVE NEGATIVE   RBC / HPF 0-5 0 - 5 RBC/hpf   WBC, UA 0-5 0 - 5 WBC/hpf   Bacteria, UA NONE SEEN NONE SEEN   Squamous Epithelial / HPF 0-5 0 - 5 /HPF   Mucus PRESENT   Urine Drug Screen, Qualitative     Status: None   Collection Time: 06/09/22 10:59 PM  Result Value Ref Range   Tricyclic, Ur Screen NONE DETECTED NONE DETECTED   Amphetamines, Ur Screen NONE DETECTED NONE DETECTED   MDMA (Ecstasy)Ur Screen NONE DETECTED NONE DETECTED   Cocaine Metabolite,Ur [place] NONE DETECTED NONE DETECTED   Opiate, Ur Screen NONE DETECTED NONE DETECTED   Phencyclidine (PCP) Ur S NONE DETECTED NONE DETECTED   Cannabinoid 50 Ng, Ur [place] NONE DETECTED NONE DETECTED   Barbiturates, Ur Screen NONE DETECTED NONE DETECTED   Benzodiazepine, Ur Scrn NONE DETECTED NONE DETECTED   Methadone Scn, Ur NONE DETECTED NONE DETECTED  CBG monitoring, ED     Status: Abnormal   Collection Time: 06/09/22 11:05 PM  Result Value Ref Range   Glucose-Capillary 418 (H) 70 - 99 mg/dL  Basic metabolic panel     Status: Abnormal   Collection Time: 06/09/22 11:20 PM  Result Value Ref Range   Sodium 138 135 - 145 mmol/L   Potassium 4.3 3.5 - 5.1 mmol/L   Chloride 104 98 - 111 mmol/L   CO2 18 (L) 22 - 32 mmol/L   Glucose, Bld 442 (H) 70 - 99 mg/dL   BUN 53 (H) 6 - 20 mg/dL   Creatinine, Ser 0.862.17 (H) 0.44 - 1.00 mg/dL   Calcium 9.0 8.9 - 57.810.3 mg/dL   GFR, Estimated 30 (L) >60 mL/min    Anion gap 16 (H) 5 - 15  CBC     Status: Abnormal   Collection Time: 06/09/22 11:20 PM  Result Value Ref Range   WBC 9.1 4.0 - 10.5 K/uL   RBC 2.33 (L) 3.87 - 5.11 MIL/uL   Hemoglobin 7.0 (L) 12.0 - 15.0 g/dL   HCT 46.919.1 (L) 62.936.0 - 52.846.0 %   MCV 82.0 80.0 - 100.0 fL   MCH 30.0 26.0 - 34.0 pg   MCHC 36.6 (H) 30.0 - 36.0 g/dL   RDW 41.312.4 24.411.5 - 01.015.5 %   Platelets 118 (L) 150 - 400 K/uL   nRBC 0.0 0.0 - 0.2 %  CBG monitoring, ED     Status: Abnormal   Collection Time: 06/09/22 11:40 PM  Result Value Ref Range   Glucose-Capillary 380 (H) 70 - 99 mg/dL  CBG monitoring, ED     Status: Abnormal   Collection Time: 06/10/22  1:02 AM  Result Value Ref Range   Glucose-Capillary 294 (H) 70 - 99 mg/dL  Prepare RBC (crossmatch)     Status: None   Collection Time: 06/10/22  1:25 AM  Result Value Ref Range   Order Confirmation      ORDER PROCESSED BY BLOOD BANK Performed at [facility], [address] [place], [place] [address]     Unresulted Labs (From admission, onward)     Start     Ordered   06/10/22 0500  Lactic acid, plasma  Tomorrow morning,   R        06/10/22 0024   06/10/22 0500  CBC with Differential/Platelet  Tomorrow morning,   R        06/10/22 0144   06/10/22 0500  Magnesium  Tomorrow morning,   R        06/10/22 0144   06/10/22 0125  Type and screen [facility]  (Blood Administration Adult)  Once,   R       Comments: [facility]    06/10/22 0125   06/09/22 2305  pH, gastric fluid (gastroccult card)  Once,   R        06/09/22 2305   06/09/22 2209  Basic metabolic panel  (Diabetes Ketoacidosis (DKA))  STAT Now then every 4 hours ,   STAT      06/09/22 2212   06/09/22 1753  Blood Culture (routine x 2)  (Septic presentation on arrival (screening labs, nursing and treatment orders for obvious sepsis))  BLOOD CULTURE X 2,   STAT      06/09/22 1754   06/09/22 1753  Urine Culture  (Septic presentation on arrival (screening  labs, nursing and treatment orders for obvious sepsis))  ONCE - URGENT,   URGENT       Question:  Indication  Answer:  Sepsis   06/09/22 1754           Pt has received : Orders Placed This Encounter  Procedures   Critical Care    This order was created via procedure documentation    Standing Status:   Standing    Number of Occurrences:   1   1-3 Lead EKG Interpretation    This order was created via procedure documentation    Standing Status:   Standing    Number of Occurrences:   1   Angiocath insertion    This order was created via procedure documentation    Standing Status:   Standing    Number of Occurrences:   1   Resp panel by RT-PCR (RSV, Flu A&B, Covid) Anterior Nasal Swab    Standing Status:   Standing    Number of Occurrences:   1   Blood Culture (routine x 2)    Standing Status:   Standing    Number of Occurrences:   2   Urine Culture    Standing Status:   Standing    Number of Occurrences:   1    Order Specific Question:   Indication    Answer:   Sepsis   DG Chest Port 1 View    Standing Status:   Standing    Number of Occurrences:   1    Order Specific Question:   Reason for Exam (SYMPTOM  OR DIAGNOSIS REQUIRED)    Answer:   Questionable sepsis - evaluate for abnormality  CT Head Wo Contrast    Standing Status:   Standing    Number of Occurrences:   1    Order Specific Question:   Is patient pregnant?    Answer:   No   Urinalysis, Routine w reflex microscopic    Standing Status:   Standing    Number of Occurrences:   1   Lactic acid, plasma    Standing Status:   Standing    Number of Occurrences:   2   Comprehensive metabolic panel    Standing Status:   Standing    Number of Occurrences:   1   CBC with Differential    Standing Status:   Standing    Number of Occurrences:   1   Lipase, blood    Standing Status:   Standing    Number of Occurrences:   1   Ammonia    Standing Status:   Standing    Number of Occurrences:   1   Procalcitonin -  Baseline    Standing Status:   Standing    Number of Occurrences:   1   Acetaminophen level    Standing Status:   Standing    Number of Occurrences:   1   Salicylate level    Standing Status:   Standing    Number of Occurrences:   1   Urine Drug Screen, Qualitative    Standing Status:   Standing    Number of Occurrences:   1   Iron and TIBC    Standing Status:   Standing    Number of Occurrences:   1   Ferritin (Iron Binding Protein)    Standing Status:   Standing    Number of Occurrences:   1   TSH    Standing Status:   Standing    Number of Occurrences:   1   Magnesium    Standing Status:   Standing    Number of Occurrences:   1   Phosphorus    Standing Status:   Standing    Number of Occurrences:   1   hCG, quantitative, pregnancy    Standing Status:   Standing    Number of Occurrences:   1   Comprehensive metabolic panel    Standing Status:   Standing    Number of Occurrences:   1   Ethanol    THIS TEST CAN'T BE ADDED BECAUSE WE NEED A UNOPENED  RED TOP TUBE    Standing Status:   Standing    Number of Occurrences:   1   Protime-INR    Standing Status:   Standing    Number of Occurrences:   1   T4, free    Standing Status:   Standing    Number of Occurrences:   1   Blood gas, arterial    Standing Status:   Standing    Number of Occurrences:   1   Beta-hydroxybutyric acid    Standing Status:   Standing    Number of Occurrences:   1   CK    Standing Status:   Standing    Number of Occurrences:   1   Basic metabolic panel    Standing Status:   Standing    Number of Occurrences:   5   CBC    Standing Status:   Standing    Number of Occurrences:   1   pH, gastric fluid (gastroccult card)  Standing Status:   Standing    Number of Occurrences:   1   Lactic acid, plasma    Standing Status:   Standing    Number of Occurrences:   1   CBC with Differential/Platelet    Standing Status:   Standing    Number of Occurrences:   1   Magnesium    Standing  Status:   Standing    Number of Occurrences:   1   Diet NPO time specified    Standing Status:   Standing    Number of Occurrences:   1   Document Height and Actual Weight    Use scales to weigh patient, not stated or estimated weight.    Standing Status:   Standing    Number of Occurrences:   1   Cardiac monitoring    Standing Status:   Standing    Number of Occurrences:   1   Document height and weight    Standing Status:   Standing    Number of Occurrences:   1   Assess and Document Glasgow Coma Scale    Standing Status:   Standing    Number of Occurrences:   1   Document vital signs within 1-hour of fluid bolus completion. Notify provider of abnormal vital signs despite fluid resuscitation.    Standing Status:   Standing    Number of Occurrences:   1   DO NOT delay antibiotics if unable to obtain blood culture.    Standing Status:   Standing    Number of Occurrences:   1   Refer to Sidebar Report: Sepsis Sidebar ED/IP    Sepsis Sidebar ED/IP    Standing Status:   Standing    Number of Occurrences:   1   Notify provider for difficulties obtaining IV access.    Standing Status:   Standing    Number of Occurrences:   1   Insert peripheral IV x 2    Angiocath size 20G or larger    Standing Status:   Standing    Number of Occurrences:   1   Initiate Carrier Fluid Protocol    Standing Status:   Standing    Number of Occurrences:   1   Initiate Carrier Fluid Protocol    Standing Status:   Standing    Number of Occurrences:   1   Notify physician (specify)    Standing Status:   Standing    Number of Occurrences:   1    Order Specific Question:   Notify physician for    Answer:   K+ <3.5 mmol/L    Order Specific Question:   Notify physician for    Answer:   Sudden headache    Order Specific Question:   Notify physician for    Answer:   Change in LOC    Order Specific Question:   Notify physician    Answer:   As prompted by [facility]    Order Specific Question:   Notify  physician for    Answer:   All abnormal BMET and Beta-Hydroxybutyrate Acid results   If present, discontinue Insulin Pump after IV Insulin is initiated.    Standing Status:   Standing    Number of Occurrences:   1   Do NOT use lab glucose values in [facility].  If CBG meter reads "Critical High", enter 600.    Standing Status:   Standing    Number of Occurrences:  1   IV insulin infusion with sufficient glucose should be continued until MD determines acidosis is corrected and places transition orders.    Standing Status:   Standing    Number of Occurrences:   1   Upon IV fluid bolus completion, place order for STAT BMET (LAB15) and call provider with results.    Standing Status:   Standing    Number of Occurrences:   1   IV bolus already initiated    Standing Status:   Standing    Number of Occurrences:   1   In and Out Cath    Standing Status:   Standing    Number of Occurrences:   1   Vital signs    Standing Status:   Standing    Number of Occurrences:   1   Up as tolerated    Standing Status:   Standing    Number of Occurrences:   1   Apply Diabetes Mellitus Care Plan    Standing Status:   Standing    Number of Occurrences:   1   Apply Diabetic Ketoacidosis Care Plan    Standing Status:   Standing    Number of Occurrences:   1   Notify physician (specify)    Standing Status:   Standing    Number of Occurrences:   1    Order Specific Question:   Notify physician for    Answer:   Pulse less than 60 or greater than 120    Order Specific Question:   Notify physician for    Answer:   Respiratory rate less than 12 or greater than 25    Order Specific Question:   Notify physician for    Answer:   Temperature greater than 38.5C (101.10F)    Order Specific Question:   Notify physician for    Answer:   Urine output less than 30 ml/hr for four hours    Order Specific Question:   Notify physician for    Answer:   Systolic BP less than 90 or greater than 140; Diastolic BP less than 60  or greater than 90   If present, discontinue Insulin Pump after IV Insulin is initiated.    Standing Status:   Standing    Number of Occurrences:   1   Do NOT use lab glucose values in [facility].  If CBG meter reads "Critical High", enter 600.    Standing Status:   Standing    Number of Occurrences:   1   Please refer to DKA Protocol sidebar report    Standing Status:   Standing    Number of Occurrences:   1   Strict intake and output    Standing Status:   Standing    Number of Occurrences:   1   Initiate Oral Care Protocol    Standing Status:   Standing    Number of Occurrences:   1   Initiate Carrier Fluid Protocol    Standing Status:   Standing    Number of Occurrences:   1   Notify physician (specify)    Standing Status:   Standing    Number of Occurrences:   1    Order Specific Question:   Notify physician    Answer:   As prompted by [facility]    Order Specific Question:   Notify physician for    Answer:   All abnormal BMET and Beta-Hydroxybutyrate Acid results    Order Specific Question:  Notify physician for    Answer:   K+ <3.5 mmol/L    Order Specific Question:   Notify physician for    Answer:   Sudden headache    Order Specific Question:   Notify physician for    Answer:   Change in LOC   Upon IV fluid bolus completion, place order for STAT BMET (LAB15) and call provider with results.    Standing Status:   Standing    Number of Occurrences:   1   IV insulin infusion with sufficient glucose should be continued until MD determines acidosis is corrected and places transition orders.    Standing Status:   Standing    Number of Occurrences:   1   RN may order General Admission PRN Orders utilizing "General Admission PRN medications" (through manage orders) for the following patient needs: allergy symptoms (Claritin), cold sores (Carmex), cough (Robitussin DM), eye irritation (Liquifilm Tears), hemorrhoids (Tucks), indigestion (Maalox), minor skin irritation (Hydrocortisone  Cream), muscle pain Romeo Apple Gay), nose irritation (saline nasal spray) and sore throat (Chloraseptic spray).    Standing Status:   Standing    Number of Occurrences:   [phone]   Neuro checks    Standing Status:   Standing    Number of Occurrences:   1   K+ > 5 mEq/L and/or K+ addressed separately    Standing Status:   Standing    Number of Occurrences:   1   Insert temp foley    Standing Status:   Standing    Number of Occurrences:   1   Apply warming blanket    Standing Status:   Standing    Number of Occurrences:   1   CBC post transfusion - RN to place lab order with appropriate draw time    Standing Status:   Standing    Number of Occurrences:   1   Full code    Standing Status:   Standing    Number of Occurrences:   1    Order Specific Question:   By:    Answer:   Other   Code Sepsis activation.  This occurs automatically when order is signed and prioritizes pharmacy, lab, and radiology services for STAT collections and interventions.  If [facility] downtime, call [facility] [phone]) to activate Code Sepsis.    Standing Status:   Standing    Number of Occurrences:   1    Order Specific Question:   Initiate "Code Sepsis" tracking    Answer:   Yes    Order Specific Question:   Contact eLink [phone])    Answer:   No    Order Specific Question:   Reason for Consult?    Answer:   tracking   Consult to Transition of Care Team    Standing Status:   Standing    Number of Occurrences:   1    Order Specific Question:   Reason for Consult:    Answer:   Medication Assistance    Order Specific Question:   Reason for Consult:    Answer:   PCP Needs   Pulse oximetry, continuous    Standing Status:   Standing    Number of Occurrences:   1   Pulse oximetry, continuous    Standing Status:   Standing    Number of Occurrences:   1   CBG monitoring, ED    Standing Status:   Standing    Number of Occurrences:   1  POC urine preg, ED    Standing Status:   Standing    Number of  Occurrences:   1   CBG monitoring, ED    Standing Status:   Standing    Number of Occurrences:   1   CBG monitoring, ED    Standing Status:   Standing    Number of Occurrences:   1   CBG monitoring, ED    Standing Status:   Standing    Number of Occurrences:   1   CBG monitoring, ED    Standing Status:   Standing    Number of Occurrences:   1   CBG monitoring, ED    Standing Status:   Standing    Number of Occurrences:   1   CBG monitoring, ED    Standing Status:   Standing    Number of Occurrences:   1   CBG monitoring, ED    Standing Status:   Standing    Number of Occurrences:   1   CBG monitoring, ED    Standing Status:   Standing    Number of Occurrences:   1   ED EKG    Altered mental status    Standing Status:   Standing    Number of Occurrences:   1    Order Specific Question:   Reason for Exam    Answer:   Other (See Comments)   EKG 12-Lead    Standing Status:   Standing    Number of Occurrences:   1   Type and screen    Standing Status:   Standing    Number of Occurrences:   1   Type and screen [place]     Standing Status:   Standing    Number of Occurrences:   1   Prepare RBC (crossmatch)    Standing Status:   Standing    Number of Occurrences:   1    Order Specific Question:   # of Units    Answer:   1 unit    Order Specific Question:   Transfusion Indications    Answer:   Hemoglobin < 7 gm/dL and symptomatic    Order Specific Question:   Number of Units to Keep Ahead    Answer:   NO units ahead    Order Specific Question:   If emergent release call blood bank    Answer:   Not emergent release   Insert peripheral IV    Standing Status:   Standing    Number of Occurrences:   1   Admit to Inpatient (patient's expected length of stay will be greater than 2 midnights or inpatient only procedure)    5-7 DAYS.    Standing Status:   Standing    Number of Occurrences:   1    Order Specific  Question:   Hospital Area    Answer:   [place] [[id number]]    Order Specific Question:   Level of Care    Answer:   Stepdown [14]    Order Specific Question:   Covid Evaluation    Answer:   Asymptomatic - no recent exposure (last 10 days) testing not required    Order Specific Question:   Diagnosis    Answer:   AMS (altered mental status) [3086578]    Order Specific Question:   Admitting Physician    Answer:   Posey Pronto,  Eliezer Mccoy [[id number]]    Order Specific Question:   Attending Physician    Answer:   Darrold Junker    Order Specific Question:   Certification:    Answer:   I certify this patient will need inpatient services for at least 2 midnights    Order Specific Question:   Estimated Length of Stay    Answer:   5   Admit to Inpatient (patient's expected length of stay will be greater than 2 midnights or inpatient only procedure)    5-7 DAYS.    Standing Status:   Standing    Number of Occurrences:   1    Order Specific Question:   Hospital Area    Answer:   [facility] [[id number]]    Order Specific Question:   Level of Care    Answer:   Stepdown [14]    Order Specific Question:   Covid Evaluation    Answer:   Asymptomatic - no recent exposure (last 10 days) testing not required    Order Specific Question:   Diagnosis    Answer:   Adrenal insufficiency (HCC) [190020]    Order Specific Question:   Admitting Physician    Answer:   Darrold Junker    Order Specific Question:   Attending Physician    Answer:   Darrold Junker    Order Specific Question:   Certification:    Answer:   I certify this patient will need inpatient services for at least 2 midnights    Order Specific Question:   Estimated Length of Stay    Answer:   5    Meds ordered this encounter  Medications   lactated ringers bolus 2,000 mL   DISCONTD: lactated ringers infusion   DISCONTD: cefTRIAXone (ROCEPHIN) 2 g in sodium chloride 0.9 % 100 mL IVPB     Order Specific Question:   Antibiotic Indication:    Answer:   UTI   DISCONTD: insulin regular, human (MYXREDLIN) 100 units/ 100 mL infusion    Order Specific Question:   EndoTool low target:    Answer:   140    Order Specific Question:   EndoTool high target:    Answer:   180    Order Specific Question:   Type of Diabetes    Answer:   Type 1    Order Specific Question:   Mode of Therapy    Answer:   ENDOX1 for DKA    Order Specific Question:   Start Method    Answer:   EndoTool to calculate   DISCONTD: lactated ringers infusion   dextrose 5 % in lactated ringers infusion   DISCONTD: dextrose 50 % solution 0-50 mL   potassium chloride 10 mEq in 100 mL IVPB   pantoprazole (PROTONIX) injection 40 mg   dexamethasone (DECADRON) injection 10 mg   hydrocortisone sodium succinate (SOLU-CORTEF) 100 MG injection 50 mg    IV hydrocortisone will be converted to either a q8h or q12h frequency with the same total daily dose (TDD).  Ordered Dose: 1 to 200 mg TDD; convert to: TDD div q12h.  Ordered Dose: 201 to 300 mg TDD; convert to: TDD div q8h.  Ordered Dose: >300 mg TDD; DAW.   insulin regular, human (MYXREDLIN) 100 units/ 100 mL infusion    Order Specific Question:   EndoTool low target:    Answer:   140    Order Specific Question:   EndoTool high target:  Answer:   180    Order Specific Question:   Type of Diabetes    Answer:   Type 1    Order Specific Question:   Mode of Therapy    Answer:   ENDOX1 for DKA    Order Specific Question:   Start Method    Answer:   EndoTool to calculate   lactated ringers infusion   dextrose 5 % in lactated ringers infusion   dextrose 50 % solution 0-50 mL   cefTRIAXone (ROCEPHIN) 1 g in sodium chloride 0.9 % 100 mL IVPB    Order Specific Question:   Antibiotic Indication:    Answer:   UTI   ondansetron (ZOFRAN) injection 4 mg   prochlorperazine (COMPAZINE) injection 10 mg   0.9 %  sodium chloride infusion (Manually program via Guardrails IV Fluids)      Admission Imaging : CT Head Wo Contrast  Result Date: 06/09/2022 CLINICAL DATA:  Altered mental status and increasing weakness EXAM: CT HEAD WITHOUT CONTRAST TECHNIQUE: Contiguous axial images were obtained from the base of the skull through the vertex without intravenous contrast. RADIATION DOSE REDUCTION: This exam was performed according to the departmental dose-optimization program which includes automated exposure control, adjustment of the mA and/or kV according to patient size and/or use of iterative reconstruction technique. COMPARISON:  None Available. FINDINGS: Brain: No evidence of acute infarction, hemorrhage, hydrocephalus, extra-axial collection or mass lesion/mass effect. Vascular: No hyperdense vessel or unexpected calcification. Skull: Normal. Negative for fracture or focal lesion. Sinuses/Orbits: Air-fluid level within the left maxillary antrum Other: None. IMPRESSION: No acute intracranial abnormality noted. Air-fluid level in left maxillary antrum consistent with acute sinusitis. Electronically Signed   By: Alcide Clever M.D.   On: 06/09/2022 19:33   DG Chest Port 1 View  Result Date: 06/09/2022 CLINICAL DATA:  Questionable sepsis - evaluate for abnormality EXAM: PORTABLE CHEST 1 VIEW COMPARISON:  Radiograph 02/27/2022 FINDINGS: Unchanged cardiomediastinal silhouette. There is no focal airspace consolidation. There is no pleural effusion or evidence of pneumothorax. No acute osseous abnormality. IMPRESSION: No evidence of acute cardiopulmonary disease. Electronically Signed   By: Caprice Renshaw M.D.   On: 06/09/2022 18:33      Physical Examination: Vitals:   06/09/22 2228 06/09/22 2257 06/09/22 2300 06/09/22 2330  BP: 110/60   121/73  Pulse:   (!) 102 (!) 103  Temp:  (!) 96.6 F (35.9 C) (!) 96.6 F (35.9 C) (!) 97 F (36.1 C)  Resp:   (!) 21 16  SpO2:   100% 100%  TempSrc:  Bladder     Physical Exam Constitutional:      Appearance: She is ill-appearing.  HENT:      Head: Normocephalic and atraumatic.     Mouth/Throat:     Mouth: Mucous membranes are dry.  Cardiovascular:     Rate and Rhythm: Regular rhythm. Tachycardia present.  Pulmonary:     Effort: Pulmonary effort is normal.     Breath sounds: Normal breath sounds.  Abdominal:     General: Bowel sounds are normal.     Palpations: Abdomen is soft. There is no mass.  Musculoskeletal:     Right lower leg: No edema.     Left lower leg: No edema.  Skin:    General: Skin is warm.       Assessment and Plan: * Shock (HCC) .  AMS (altered mental status) .  DKA (diabetic ketoacidosis) (HCC) .  Type 1 diabetes mellitus with hyperglycemia (HCC) .  Coffee ground emesis .  Hypothyroidism .  Thrombocytopenia (HCC) .  Iron deficiency anemia .   > Shock: Patient presented initially with systolic blood pressure 60s. Patient was started on IV fluids and received. Patient was given Decadron for suspicion of adrenal crisis. Will start patient on hydrocortisone 50 mg every 12. Vitals:   06/09/22 2000 06/09/22 2015 06/09/22 2030 06/09/22 2045  BP: 127/75 135/86 (!) 157/84 135/69   06/09/22 2100 06/09/22 2115 06/09/22 2130 06/09/22 2145  BP: 104/80 128/78 136/78 135/85   06/09/22 2200 06/09/22 2215 06/09/22 2228 06/09/22 2330  BP: 128/83 (!) 142/96 110/60 121/73  Hold blood pressure medications.  >> Altered mental status: Attribute to combination of metabolic encephalopathy, hypoperfusion, dehydration, metabolic acidosis. Treat the underlying cause. Neurochecks every 4. Head CT within normal limits negative for any acute findings.   >> DKA/diabetes mellitus type 1: Patient came in with blood sugars over 800 and started on DKA protocol anion gap and serum bicarb has both improved. Will continue  Metabolic panel checks and glycemic protocol with Accu-Cheks.    >> Coffee-ground emesis/anemia:    Latest Ref Rng & Units 06/09/2022   11:20 PM 06/09/2022    5:44 PM 02/28/2022     4:16 AM  CBC  WBC 4.0 - 10.5 K/uL 9.1  10.4  3.8   Hemoglobin 12.0 - 15.0 g/dL 7.0  8.2  16.1   Hematocrit 36.0 - 46.0 % 19.1  23.4  35.4   Platelets 150 - 400 K/uL 118  129  91   Attribute to chronic blood loss in addition to anemia of chronic disease from Chronic kidney disease. Repeat CBC has shown a hemoglobin of 7 we will proceed with type and cross 1 unit and transfuse. We will transfuse pt one unit.post transfusion CBC.     >> Hypothyroidism: Continue patient on levothyroxine p.o. once stable and med rec is done. Will consider IV levothyroxine tomorrow,   >> Anion gap metabolic acidosis: High index of suspicion for adrenal insufficiency, DKA related dehydration and hypoperfusion and hypotension, Reduced ejection fraction congestive heart failure causing hypotension and hypoperfusion.    DVT prophylaxis:  SCD's  Code Status:  Full code   Family Communication:  Maya,Cruz Clearence Cheek) [phone] (Mobile)   Disposition Plan:  Home   Consults called:  None  Admission status: Inpatient   Unit/ Expected LOS: Stepdown / 4 days.     Gertha Calkin MD [facility]  6 PM- 2 AM. Please contact me via secure Chat 6 PM-2 AM. [phone]( Pager ) To contact the [facility] Attending or Consulting provider 7A - 7P or covering provider during after hours 7P -7A, for this patient.   Check the care team in [facility] and look for a) attending/consulting [facility] provider listed and b) the [facility] team listed Log into [email or web address] and use [place]'s universal password to access. If you do not have the password, please contact the hospital operator. Locate the [facility] provider you are looking for under [facility] and page to a number that you can be directly reached. If you still have difficulty reaching the provider, please page the [facility] (Director on Call) for the Hospitalists listed on amion for assistance. [email or web address] 06/10/2022, 1:44 AM

## 2022-06-09 NOTE — Sepsis Progress Note (Signed)
eLink is following this Code Sepsis.

## 2022-06-09 NOTE — Hospital Course (Addendum)
32 year old female brought to the emergency room for weakness, vomiting and lethargy.  She was found to be in diabetic ketoacidosis.  She also has a history of depression and stopped taking her medication secondary to cost.  Patient has a history of adrenal insufficiency and type 1 diabetes.  Prolonged and complicated hospital admission with detailed course below.  Currently being trained to self-administer nocturnal tube feeds, and has persistent / recurrent electrolyte abnormalities and anemia needing transfusion.  1/18.  Tapered off insulin drip.  No tolerance for PO intake due to refractory N/V related to gastroparesis 1/19.  Patient agreeable to Dobbhoff feeding tube, it was placed by IR, but did not advance past stomach after 3 days.  Dobhoff removed 1/21.  Jejunostomy tube placed by Dr. Dahlia Byes. 1/22.  Fluid bolus for low BP;s & stress dose steroids. Transfused 1 units for Hbg 7.4.   1/23.  Patient feeling a little bit better.  Steroids tapered back to home Cortef.  Had some intermittent vomiting yesterday.  Up to 30 mL/hr tube feeds by J-tube.  1/24 >> 1/31 recurrent N/V and abdominal discomfort with increasing tube feed rate.   1/25: stopped Reglan, increased erythromycin,  1/26: pt eating a sandwich, N/V improved today.  .  1/28: no N/V past couple days, tolerating meals much better, requesting Glucerna shakes.  1/29: transition to nocturnal feeds tonight. She continues to do well.   1/30: hypoglycemic this AM (44) - insulin was given overnight despite tube feeds not started 1/31: Nocturnal feeds started, she tolerated at 55 cc/hr (goal 75 cc/hr).    2/7 >> 2/13: calorie count done. Eating adequate calories, but pt eating much better here than she is able to at home.   Tapered and stopped erythromycin (per GI max is 2 weeks). Monitoring closely for recurrent motility issues. Patient wants to continue nocturnal feeds. Night nurses should be training to her manage tube feeds. Needed 1  unit RBC's today 2/13. Recurrent hyperkalemia remains an issue. Sugars typically are VERY labile, but stable for past few days. 2/14: Discontinue tube feeds.  TOC looking into placement.  Remains hyperkalemic.  Blood pressure running high so added amlodipine 2/15: Patient prefers to go home instead of group home

## 2022-06-09 NOTE — ED Notes (Signed)
Per lab gastric fluid (vomit) was not collected correctly and needs to be recollected when pt vomits again.

## 2022-06-09 NOTE — Progress Notes (Signed)
CODE SEPSIS - PHARMACY COMMUNICATION  **Broad Spectrum Antibiotics should be administered within 1 hour of Sepsis diagnosis**  Time Code Sepsis Called/Page Received: 1757  Antibiotics Ordered: ceftriaxone  Time of 1st antibiotic administration: 1807  Additional action taken by pharmacy:    If necessary, Name of Provider/Nurse Contacted:      Noralee Space ,PharmD Clinical Pharmacist  06/09/2022  6:48 PM

## 2022-06-09 NOTE — Progress Notes (Signed)
Requested to evaluate patient for ICU admission. 32 yo F presenting with lethargy & weakness with vomiting in the setting of T1DM and DKA. Upon bedside assessment patient is encephalopathic, and frail appearing, however vitals are stable on room air. ABG consistent with compensated metabolic acidosis: 4.74/ 25/ 142/ 14.4.  I agree with DKA protocol treatment plan. Also a concern for upper GIB, Hgb currently stable at 8.2 and BP stable without intervention other then IVF resuscitation.  Patient does not meet ICU criteria for admission at this time. Please re-consult if needed in the future.    Domingo Pulse [place], AGACNP-BC Acute Care Nurse Practitioner [facility]   [phone] / [phone] Please see Amion for pager details.

## 2022-06-09 NOTE — ED Notes (Signed)
Pt is hard stick. MD Mosetta Pigeon, RN using ultrasound to try to get IV insertion into patient.

## 2022-06-09 NOTE — Assessment & Plan Note (Addendum)
TSH slightly high.  Restarted levothyroxine at a slightly higher dose.

## 2022-06-09 NOTE — ED Triage Notes (Addendum)
Pt comes with c/o increased weakness, vomiting. Pt denies any dark stools. Pt is very pale and looks very sick. Pt is diabetic.unsure what sugar is.   Pt is currently very depressed per family member and has not been taking medications. Pt has been not eating and staying in bed a lot.

## 2022-06-09 NOTE — ED Notes (Signed)
Pt in room very lethargic. Absolutely no response to name or anything. Rns and MD at bedside trying to obtain access and get labs drawn.

## 2022-06-10 DIAGNOSIS — N179 Acute kidney failure, unspecified: Secondary | ICD-10-CM | POA: Diagnosis not present

## 2022-06-10 DIAGNOSIS — R634 Abnormal weight loss: Secondary | ICD-10-CM | POA: Diagnosis not present

## 2022-06-10 DIAGNOSIS — R636 Underweight: Secondary | ICD-10-CM | POA: Diagnosis present

## 2022-06-10 DIAGNOSIS — G928 Other toxic encephalopathy: Secondary | ICD-10-CM | POA: Diagnosis present

## 2022-06-10 DIAGNOSIS — E43 Unspecified severe protein-calorie malnutrition: Secondary | ICD-10-CM

## 2022-06-10 DIAGNOSIS — K3184 Gastroparesis: Secondary | ICD-10-CM | POA: Diagnosis not present

## 2022-06-10 DIAGNOSIS — E2740 Unspecified adrenocortical insufficiency: Secondary | ICD-10-CM | POA: Diagnosis not present

## 2022-06-10 DIAGNOSIS — D638 Anemia in other chronic diseases classified elsewhere: Secondary | ICD-10-CM | POA: Diagnosis not present

## 2022-06-10 DIAGNOSIS — D649 Anemia, unspecified: Secondary | ICD-10-CM | POA: Diagnosis not present

## 2022-06-10 DIAGNOSIS — N189 Chronic kidney disease, unspecified: Secondary | ICD-10-CM | POA: Diagnosis not present

## 2022-06-10 DIAGNOSIS — D696 Thrombocytopenia, unspecified: Secondary | ICD-10-CM

## 2022-06-10 DIAGNOSIS — F32A Depression, unspecified: Secondary | ICD-10-CM

## 2022-06-10 DIAGNOSIS — I9589 Other hypotension: Secondary | ICD-10-CM

## 2022-06-10 DIAGNOSIS — G9341 Metabolic encephalopathy: Secondary | ICD-10-CM | POA: Diagnosis not present

## 2022-06-10 DIAGNOSIS — E039 Hypothyroidism, unspecified: Secondary | ICD-10-CM | POA: Diagnosis not present

## 2022-06-10 DIAGNOSIS — R7989 Other specified abnormal findings of blood chemistry: Secondary | ICD-10-CM | POA: Diagnosis not present

## 2022-06-10 DIAGNOSIS — E1010 Type 1 diabetes mellitus with ketoacidosis without coma: Secondary | ICD-10-CM | POA: Diagnosis not present

## 2022-06-10 DIAGNOSIS — R112 Nausea with vomiting, unspecified: Secondary | ICD-10-CM | POA: Diagnosis not present

## 2022-06-10 DIAGNOSIS — I959 Hypotension, unspecified: Secondary | ICD-10-CM | POA: Diagnosis present

## 2022-06-10 HISTORY — DX: Metabolic encephalopathy: G93.41

## 2022-06-10 LAB — BASIC METABOLIC PANEL
Anion gap: 10 (ref 5–15)
Anion gap: 8 (ref 5–15)
BUN: 53 mg/dL — ABNORMAL HIGH (ref 6–20)
BUN: 48 mg/dL — ABNORMAL HIGH (ref 6–20)
CO2: 23 mmol/L (ref 22–32)
CO2: 24 mmol/L (ref 22–32)
Calcium: 9.3 mg/dL (ref 8.9–10.3)
Calcium: 8.8 mg/dL — ABNORMAL LOW (ref 8.9–10.3)
Chloride: 104 mmol/L (ref 98–111)
Chloride: 106 mmol/L (ref 98–111)
Creatinine, Ser: 1.95 mg/dL — ABNORMAL HIGH (ref 0.44–1.00)
Creatinine, Ser: 1.71 mg/dL — ABNORMAL HIGH (ref 0.44–1.00)
GFR, Estimated: 35 mL/min — ABNORMAL LOW (ref >60)
GFR, Estimated: 41 mL/min — ABNORMAL LOW (ref >60)
Glucose, Bld: 288 mg/dL — ABNORMAL HIGH (ref 70–99)
Glucose, Bld: 198 mg/dL — ABNORMAL HIGH (ref 70–99)
Potassium: 4.3 mmol/L (ref 3.5–5.1)
Potassium: 4.3 mmol/L (ref 3.5–5.1)
Sodium: 137 mmol/L (ref 135–145)
Sodium: 138 mmol/L (ref 135–145)

## 2022-06-10 LAB — GLUCOSE, CAPILLARY
Glucose-Capillary: 179 mg/dL — ABNORMAL HIGH (ref 70–99)
Glucose-Capillary: 184 mg/dL — ABNORMAL HIGH (ref 70–99)
Glucose-Capillary: 180 mg/dL — ABNORMAL HIGH (ref 70–99)
Glucose-Capillary: 193 mg/dL — ABNORMAL HIGH (ref 70–99)
Glucose-Capillary: 197 mg/dL — ABNORMAL HIGH (ref 70–99)
Glucose-Capillary: 158 mg/dL — ABNORMAL HIGH (ref 70–99)
Glucose-Capillary: 147 mg/dL — ABNORMAL HIGH (ref 70–99)
Glucose-Capillary: 149 mg/dL — ABNORMAL HIGH (ref 70–99)
Glucose-Capillary: 181 mg/dL — ABNORMAL HIGH (ref 70–99)
Glucose-Capillary: 153 mg/dL — ABNORMAL HIGH (ref 70–99)

## 2022-06-10 LAB — CBC
HCT: 24.8 % — ABNORMAL LOW (ref 36.0–46.0)
Hemoglobin: 9.1 g/dL — ABNORMAL LOW (ref 12.0–15.0)
MCH: 31.3 pg (ref 26.0–34.0)
MCHC: 36.7 g/dL — ABNORMAL HIGH (ref 30.0–36.0)
MCV: 85.2 fL (ref 80.0–100.0)
Platelets: 109 10*3/uL — ABNORMAL LOW (ref 150–400)
RBC: 2.91 MIL/uL — ABNORMAL LOW (ref 3.87–5.11)
RDW: 13.9 % (ref 11.5–15.5)
WBC: 7.3 10*3/uL (ref 4.0–10.5)
nRBC: 0 % (ref 0.0–0.2)

## 2022-06-10 LAB — CBC WITH DIFFERENTIAL/PLATELET
Abs Immature Granulocytes: 0.02 10*3/uL (ref 0.00–0.07)
Basophils Absolute: 0 10*3/uL (ref 0.0–0.1)
Basophils Relative: 0 %
Eosinophils Absolute: 0 10*3/uL (ref 0.0–0.5)
Eosinophils Relative: 0 %
HCT: 17.7 % — ABNORMAL LOW (ref 36.0–46.0)
Hemoglobin: 5.5 g/dL — ABNORMAL LOW (ref 12.0–15.0)
Immature Granulocytes: 1 %
Lymphocytes Relative: 7 %
Lymphs Abs: 0.3 10*3/uL — ABNORMAL LOW (ref 0.7–4.0)
MCH: 31.1 pg (ref 26.0–34.0)
MCHC: 31.1 g/dL (ref 30.0–36.0)
MCV: 100 fL (ref 80.0–100.0)
Monocytes Absolute: 0.1 10*3/uL (ref 0.1–1.0)
Monocytes Relative: 2 %
Neutro Abs: 3.9 10*3/uL (ref 1.7–7.7)
Neutrophils Relative %: 90 %
Platelets: 68 10*3/uL — ABNORMAL LOW (ref 150–400)
RBC: 1.77 MIL/uL — ABNORMAL LOW (ref 3.87–5.11)
RDW: 15.2 % (ref 11.5–15.5)
WBC: 4.3 10*3/uL (ref 4.0–10.5)
nRBC: 0 % (ref 0.0–0.2)

## 2022-06-10 LAB — MRSA NEXT GEN BY PCR, NASAL: MRSA by PCR Next Gen: NOT DETECTED

## 2022-06-10 LAB — LACTIC ACID, PLASMA
Lactic Acid, Venous: >9 mmol/L (ref 0.5–1.9)
Lactic Acid, Venous: 1.2 mmol/L (ref 0.5–1.9)
Lactic Acid, Venous: 1 mmol/L (ref 0.5–1.9)

## 2022-06-10 LAB — CBG MONITORING, ED
Glucose-Capillary: 294 mg/dL — ABNORMAL HIGH (ref 70–99)
Glucose-Capillary: 224 mg/dL — ABNORMAL HIGH (ref 70–99)
Glucose-Capillary: 211 mg/dL — ABNORMAL HIGH (ref 70–99)
Glucose-Capillary: 193 mg/dL — ABNORMAL HIGH (ref 70–99)

## 2022-06-10 LAB — MAGNESIUM: Magnesium: 1.7 mg/dL (ref 1.7–2.4)

## 2022-06-10 LAB — POC URINE PREG, ED: Preg Test, Ur: NEGATIVE

## 2022-06-10 LAB — PREPARE RBC (CROSSMATCH)

## 2022-06-10 MED ORDER — FLUOXETINE HCL 10 MG PO CAPS
10.0000 mg | ORAL_CAPSULE | Freq: Every day | ORAL | Status: DC
  Administered 2022-06-11 – 2022-07-09 (×28): 10 mg via ORAL
  Filled 2022-06-10 (×30): qty 1

## 2022-06-10 MED ORDER — SODIUM CHLORIDE 0.9% IV SOLUTION: Freq: Once | INTRAVENOUS | Status: DC

## 2022-06-10 MED ORDER — ONDANSETRON HCL 4 MG/2ML IJ SOLN
4.0000 mg | Freq: Four times a day (QID) | INTRAMUSCULAR | Status: DC | PRN
  Administered 2022-06-10 – 2022-06-11 (×2): 4 mg via INTRAVENOUS
  Filled 2022-06-10 (×2): qty 2

## 2022-06-10 MED ORDER — INSULIN GLARGINE-YFGN 100 UNIT/ML ~~LOC~~ SOLN
4.0000 [IU] | Freq: Two times a day (BID) | SUBCUTANEOUS | Status: DC
  Administered 2022-06-10: 4 [IU] via SUBCUTANEOUS
  Filled 2022-06-10 (×2): qty 0.04

## 2022-06-10 MED ORDER — INSULIN ASPART 100 UNIT/ML IJ SOLN
0.0000 [IU] | Freq: Three times a day (TID) | INTRAMUSCULAR | Status: DC
  Administered 2022-06-10: 1 [IU] via SUBCUTANEOUS
  Administered 2022-06-11 (×2): 2 [IU] via SUBCUTANEOUS
  Administered 2022-06-11: 5 [IU] via SUBCUTANEOUS
  Administered 2022-06-12: 3 [IU] via SUBCUTANEOUS
  Administered 2022-06-12: 4 [IU] via SUBCUTANEOUS
  Administered 2022-06-12 – 2022-06-13 (×2): 3 [IU] via SUBCUTANEOUS
  Administered 2022-06-14: 2 [IU] via SUBCUTANEOUS
  Administered 2022-06-15: 5 [IU] via SUBCUTANEOUS
  Filled 2022-06-10 (×10): qty 1

## 2022-06-10 MED ORDER — ALUM & MAG HYDROXIDE-SIMETH 200-200-20 MG/5ML PO SUSP
30.0000 mL | Freq: Once | ORAL | Status: AC
  Administered 2022-06-10: 30 mL via ORAL
  Filled 2022-06-10 (×2): qty 30

## 2022-06-10 MED ORDER — LEVOTHYROXINE SODIUM 50 MCG PO TABS
50.0000 ug | ORAL_TABLET | Freq: Every day | ORAL | Status: DC
  Administered 2022-06-11 – 2022-07-09 (×28): 50 ug via ORAL
  Filled 2022-06-10 (×28): qty 1

## 2022-06-10 MED ORDER — ENSURE ENLIVE PO LIQD
237.0000 mL | Freq: Three times a day (TID) | ORAL | Status: DC
  Administered 2022-06-10 – 2022-06-13 (×6): 237 mL via ORAL

## 2022-06-10 MED ORDER — METOCLOPRAMIDE HCL 5 MG/ML IJ SOLN
5.0000 mg | Freq: Three times a day (TID) | INTRAMUSCULAR | Status: DC
  Administered 2022-06-10 – 2022-06-17 (×18): 5 mg via INTRAVENOUS
  Filled 2022-06-10 (×19): qty 2

## 2022-06-10 MED ORDER — ADULT MULTIVITAMIN W/MINERALS CH
1.0000 | ORAL_TABLET | Freq: Every day | ORAL | Status: DC
  Administered 2022-06-14 – 2022-07-09 (×26): 1 via ORAL
  Filled 2022-06-10 (×27): qty 1

## 2022-06-10 MED ORDER — LACTATED RINGERS IV SOLN: INTRAVENOUS | Status: DC

## 2022-06-10 MED ORDER — INSULIN GLARGINE-YFGN 100 UNIT/ML ~~LOC~~ SOLN
4.0000 [IU] | Freq: Every day | SUBCUTANEOUS | Status: DC
  Filled 2022-06-10: qty 0.04

## 2022-06-10 MED ORDER — CHLORHEXIDINE GLUCONATE CLOTH 2 % EX PADS: 6.0000 | MEDICATED_PAD | Freq: Every day | CUTANEOUS | Status: DC

## 2022-06-10 MED ORDER — INSULIN ASPART 100 UNIT/ML IJ SOLN
0.0000 [IU] | Freq: Every day | INTRAMUSCULAR | Status: DC
  Administered 2022-06-12: 3 [IU] via SUBCUTANEOUS
  Administered 2022-06-14: 4 [IU] via SUBCUTANEOUS
  Filled 2022-06-10 (×2): qty 1

## 2022-06-10 MED ORDER — PROCHLORPERAZINE EDISYLATE 10 MG/2ML IJ SOLN
10.0000 mg | Freq: Once | INTRAMUSCULAR | Status: AC
  Administered 2022-06-10: 10 mg via INTRAVENOUS
  Filled 2022-06-10: qty 2

## 2022-06-10 MED ORDER — PANTOPRAZOLE SODIUM 40 MG IV SOLR
40.0000 mg | INTRAVENOUS | Status: DC
  Administered 2022-06-10 – 2022-06-12 (×3): 40 mg via INTRAVENOUS
  Filled 2022-06-10 (×3): qty 10

## 2022-06-10 MED ORDER — ONDANSETRON HCL 4 MG/2ML IJ SOLN
4.0000 mg | Freq: Once | INTRAMUSCULAR | Status: AC
  Administered 2022-06-10: 4 mg via INTRAVENOUS
  Filled 2022-06-10: qty 2

## 2022-06-10 MED ORDER — MAGNESIUM SULFATE 2 GM/50ML IV SOLN
2.0000 g | Freq: Once | INTRAVENOUS | Status: AC
  Administered 2022-06-10: 2 g via INTRAVENOUS
  Filled 2022-06-10: qty 50

## 2022-06-10 NOTE — Progress Notes (Addendum)
Progress Note   Patient: Ashley Camacho RCV:[id number] DOB: [birth year] DOA: 06/09/2022     1 DOS: the patient was seen and examined on 06/10/2022   Brief hospital course: 32 year old female brought to the emergency room for weakness, vomiting and lethargy.  She was found to be in diabetic ketoacidosis.  She also has a history of depression and stopped taking her medication secondary to cost.  Patient has a history of adrenal insufficiency and diabetes.  1/18.  Patient interested in having a place to stay to help take care of her.  Case discussed with psychiatry to see the patient in consultation.  Tapering off insulin drip to long-acting insulin.   Assessment and Plan: * DKA (diabetic ketoacidosis) ([place]) Type 1 diabetes with diabetic ketoacidosis.  Converting off insulin drip to long-acting insulin today 4 units day.  Will also give sliding scale insulin.  Advancing diet.  Hypotension Improved with IV fluids.  Patient also given IV steroids.  Check orthostatic vital signs.  Adrenal insufficiency (HCC) Continue Solu-Cortef today hopefully switch over to oral Cortef tomorrow.  Check orthostatic vital signs.  Acute metabolic encephalopathy Secondary to diabetic ketoacidosis.  This has improved today.  Hypothyroidism TSH slightly high.  Restart levothyroxine at a slightly higher dose.  Thrombocytopenia ([place]) Looking back at labs this seems chronic in nature.  Underweight BMI 15.37.  Hypomagnesemia Replacing magnesium IV  Pseudohyponatremia Sodium initially low secondary to diabetic ketoacidosis.  Last sodium in the normal range.  Protein-calorie malnutrition, severe Appreciate dietitian consultation.  Supplements.  Anemia of chronic disease Received 1 unit of packed red blood cells for hemoglobin of 7.0.  Ferritin elevated at 1511.  Last hemoglobin 9.1.  (Hemoglobin of 5.5 was a lab error).  Depression Appreciate psychiatric consultation.  Prozac started.  Acute kidney  injury superimposed on chronic kidney disease ([place]) Acute kidney injury on CKD likely stage II.  Creatinine 2.58 on presentation and down to 1.71.        Subjective: Patient was nauseous this morning.  Admitted with DKA.  She was interested in a place to go to help take care of her.  Patient states that she does have some depression.  She is scared that she has a 32-year-old child.  Feels very dizzy especially with standing up.  Physical Exam: Vitals:   06/10/22 1300 06/10/22 1323 06/10/22 1400 06/10/22 1500  BP: 116/71 (!) 141/84 129/74 119/63  Pulse: 77 92 82 77  Resp: 17 18 19 15   Temp: 99.7 F (37.6 C) 99 F (37.2 C) 99.3 F (37.4 C) 99.5 F (37.5 C)  TempSrc:   Bladder   SpO2: 100% 100% 97% 100%  Weight:      Height:       Physical Exam HENT:     Head: Normocephalic.     Mouth/Throat:     Pharynx: No oropharyngeal exudate.  Eyes:     General: Lids are normal.     Conjunctiva/sclera: Conjunctivae normal.  Cardiovascular:     Rate and Rhythm: Normal rate and regular rhythm.     Heart sounds: Normal heart sounds, S1 normal and S2 normal.  Pulmonary:     Breath sounds: No decreased breath sounds, wheezing, rhonchi or rales.  Abdominal:     Palpations: Abdomen is soft.     Tenderness: There is no abdominal tenderness.  Musculoskeletal:     Right lower leg: No swelling.     Left lower leg: No swelling.  Skin:    General: Skin is warm.  Findings: No rash.  Neurological:     Mental Status: She is alert and oriented to person, place, and time.     Data Reviewed: BUN 48 creatinine 1.71, lactic acid 1.0 (lactic acid of 9.0 was a contamination)  Family Communication: Declined  Disposition: Status is: Inpatient Remains inpatient appropriate because: Obtaining psychiatry consultation.  Tapering off insulin drip and on long-acting insulin.  Will need to continue working with physical therapy and need to check orthostatic vital signs.  Continue stress dose steroids  for now.  Planned Discharge Destination: To be determined    Time spent: 28 minutes Case discussed with dietitian, nursing staff, psychiatry, transitional care team  Author: Loletha Grayer, MD 06/10/2022 3:39 PM  For on call review [email or web address].

## 2022-06-10 NOTE — Consult Note (Signed)
[facility] Consult   Reason for Consult:  Depression  Referring Physician:  Dr. Leslye Peer  Patient Identification: Ashley Camacho MRN:  [id number] Principal Diagnosis: Shock [facility]) Diagnosis:  Principal Problem:   Shock ([place]) Active Problems:   Hypothyroidism   DKA (diabetic ketoacidosis) ([place])   Thrombocytopenia ([place])   Type 1 diabetes mellitus with hyperglycemia ([place])   AMS (altered mental status)   Coffee ground emesis   Adrenal insufficiency (HCC)   Iron deficiency anemia   Total Time spent with patient: 45 minutes  Subjective: "I've been so sick".  Ashley Camacho is a 32 y.o. female patient who was brought into the ED by significant other with increased weakness and vomiting which has led to a decrease in her overall mood and energy levels. Patient is noncompliant with medication regimen for mental health and medical comorbidities.   On evaluation, patient reports she has been experiencing depressive symptoms, which have been exacerbated by her physical health issues. She reports feeling sad due to being "sick all the time". Her depressive symptoms have led to decrease in her appetite and an increase in her time spent in bed. Patient has not been taking any medications for depression, but she is open to starting a new medication to manage her symptoms. Will start her on Prozac 10 mg in hopes that this medication would not only address her depressive symptoms but also increase her energy levels.  Patient admits to experiencing occasional thoughts of not wanting to be alive due to her physical health, but she denies having a specific plan. She also denies any history of suicide attempts or self-harm. She denies experiencing homicidal ideations and auditory or visual hallucinations.    Patient has 2 children, a 39-year-old and 35 year old, living with her at home. She reports that her current energy levels and physical health issues have made it difficult for her to care  for herself and her children. Despite her physical and mental health struggle, patient's sleep is reported as satisfactory. However, her appetite is poor, which she attributes to her ongoing comorbidities. Patient expresses a desire to be discharged to a nursing facility to regain her strength.   During visit, patient was very soft-spoken, maintaining good eye contact, and engaged easily in conversation. However she appeared very weak and exhibited a flat affect.  HPI: Per Dr. Posey Pronto, Ashley Camacho is an 32 y.o. female brought to the emergency room for weakness vomiting lethargy.  Per report patient had not been eating and has been sleeping a lot. Patient has a history of postpartum depression and has stopped taking medications for her depression. On arrival patient was found to be hypotensive hypoglycemic and acidotic. Patient has a history of adrenal insufficiency and her presentation is still very suspicious for crisis.  Past Psychiatric History:  Depression ([place])   Risk to Self:   Risk to Others:   Prior Inpatient Therapy:   Prior Outpatient Therapy:    Past Medical History:  Past Medical History:  Diagnosis Date   Adrenal insufficiency ([place])    Anemia    Gastroparesis    Hypertension    Type 1 diabetes ([place])     Past Surgical History:  Procedure Laterality Date   BIOPSY  01/14/2021   Procedure: BIOPSY;  Surgeon: Thornton Park, MD;  Location: [place];  Service: Gastroenterology;;   CESAREAN SECTION     CESAREAN SECTION WITH BILATERAL TUBAL LIGATION N/A 02/17/2021   Procedure: CESAREAN SECTION WITH BILATERAL TUBAL LIGATION;  Surgeon: Nehemiah Settle,  Rhona Raider, DO;  Location: [facility];  Service: Obstetrics;  Laterality: N/A;   ESOPHAGOGASTRODUODENOSCOPY (EGD) WITH PROPOFOL N/A 01/14/2021   Procedure: ESOPHAGOGASTRODUODENOSCOPY (EGD) WITH PROPOFOL;  Surgeon: Tressia Danas, MD;  Location: [facility] ENDOSCOPY;  Service: Gastroenterology;  Laterality: N/A;   MOUTH SURGERY     Family  History:  Family History  Problem Relation Age of Onset   Diabetes type I Father    CAD Father    CAD Paternal Grandmother    CAD Paternal Grandfather    Breast cancer Mother    Ovarian cancer Neg Hx    Family Psychiatric  History: Chart reviewed. No pertinent past psychiatric history. Social History:  Social History   Substance and Sexual Activity  Alcohol Use No     Social History   Substance and Sexual Activity  Drug Use No    Social History   Socioeconomic History   Marital status: Single    Spouse name: Not on file   Number of children: Not on file   Years of education: Not on file   Highest education level: Not on file  Occupational History   Occupation: home maker  Tobacco Use   Smoking status: Never   Smokeless tobacco: Never  Vaping Use   Vaping Use: Never used  Substance and Sexual Activity   Alcohol use: No   Drug use: No   Sexual activity: Yes    Birth control/protection: None  Other Topics Concern   Not on file  Social History Narrative   Not on file   Social Determinants of Health   Financial Resource Strain: Not on file  Food Insecurity: No Food Insecurity (06/10/2022)   Hunger Vital Sign    Worried About Running Out of Food in the Last Year: Never true    Ran Out of Food in the Last Year: Never true  Transportation Needs: No Transportation Needs (06/10/2022)   PRAPARE - Administrator, Civil Service (Medical): No    Lack of Transportation (Non-Medical): No  Physical Activity: Not on file  Stress: Not on file  Social Connections: Not on file   Additional Social History:    Allergies:  No Known Allergies  Labs:  Results for orders placed or performed during the hospital encounter of 06/09/22 (from the past 48 hour(s))  CBG monitoring, ED     Status: Abnormal   Collection Time: 06/09/22  5:31 PM  Result Value Ref Range   Glucose-Capillary >600 (HH) 70 - 99 mg/dL    Comment: Glucose reference range applies only to samples  taken after fasting for at least 8 hours.  Lactic acid, plasma     Status: Abnormal   Collection Time: 06/09/22  5:44 PM  Result Value Ref Range   Lactic Acid, Venous 3.4 (HH) 0.5 - 1.9 mmol/L    Comment: CRITICAL RESULT CALLED TO, READ BACK BY AND VERIFIED WITH HANNAH KLEIN @1822  ON 06/09/22 SKL Performed at [facility], [address] [place], [place] [address]   Comprehensive metabolic panel     Status: Abnormal   Collection Time: 06/09/22  5:44 PM  Result Value Ref Range   Sodium 132 (L) 135 - 145 mmol/L    Comment: ELECTROLYTES REPEATED TO VERIFY SKL   Potassium 4.9 3.5 - 5.1 mmol/L   Chloride 95 (L) 98 - 111 mmol/L   CO2 10 (L) 22 - 32 mmol/L   Glucose, Bld 887 (HH) 70 - 99 mg/dL    Comment: CRITICAL RESULT CALLED  TO, READ BACK BY AND VERIFIED WITH HANNAH KLEIN @1844  ON 06/09/22 SKL Glucose reference range applies only to samples taken after fasting for at least 8 hours.    BUN 63 (H) 6 - 20 mg/dL   Creatinine, Ser 06/11/22 (H) 0.44 - 1.00 mg/dL   Calcium 9.2 8.9 - 2.59 mg/dL   Total Protein 6.9 6.5 - 8.1 g/dL   Albumin 4.4 3.5 - 5.0 g/dL   AST 23 15 - 41 U/L   ALT 15 0 - 44 U/L   Alkaline Phosphatase 101 38 - 126 U/L   Total Bilirubin 2.3 (H) 0.3 - 1.2 mg/dL   GFR, Estimated 25 (L) >60 mL/min    Comment: (NOTE) Calculated using the CKD-EPI Creatinine Equation (2021)    Anion gap 27 (H) 5 - 15    Comment: Performed at [facility], [address] [place], [place]  CBC with Differential     Status: Abnormal   Collection Time: 06/09/22  5:44 PM  Result Value Ref Range   WBC 10.4 4.0 - 10.5 K/uL   RBC 2.71 (L) 3.87 - 5.11 MIL/uL   Hemoglobin 8.2 (L) 12.0 - 15.0 g/dL   HCT 06/11/22 (L) 33.2 - 95.1 %   MCV 86.3 80.0 - 100.0 fL   MCH 30.3 26.0 - 34.0 pg   MCHC 35.0 30.0 - 36.0 g/dL   RDW 88.4 16.6 - 06.3 %   Platelets 129 (L) 150 - 400 K/uL   nRBC 0.0 0.0 - 0.2 %   Neutrophils Relative % 77 %   Neutro Abs 8.1 (H) 1.7 - 7.7 K/uL   Lymphocytes  Relative 16 %   Lymphs Abs 1.6 0.7 - 4.0 K/uL   Monocytes Relative 5 %   Monocytes Absolute 0.5 0.1 - 1.0 K/uL   Eosinophils Relative 1 %   Eosinophils Absolute 0.1 0.0 - 0.5 K/uL   Basophils Relative 1 %   Basophils Absolute 0.1 0.0 - 0.1 K/uL   Immature Granulocytes 0 %   Abs Immature Granulocytes 0.02 0.00 - 0.07 K/uL    Comment: Performed at [facility], [address] [place], [place]  Blood Culture (routine x 2)     Status: None (Preliminary result)   Collection Time: 06/09/22  5:44 PM   Specimen: BLOOD  Result Value Ref Range   Specimen Description BLOOD BLOOD RIGHT ARM    Special Requests      BOTTLES DRAWN AEROBIC AND ANAEROBIC Blood Culture results may not be optimal due to an inadequate volume of blood received in culture bottles   Culture      NO GROWTH < 12 HOURS Performed at [facility], [address] [place], [place]    Report Status PENDING   Lipase, blood     Status: None   Collection Time: 06/09/22  5:44 PM  Result Value Ref Range   Lipase 31 11 - 51 U/L    Comment: Performed at [facility], [address] [place], [place]  Troponin I (High Sensitivity)     Status: None   Collection Time: 06/09/22  5:44 PM  Result Value Ref Range   Troponin I (High Sensitivity) 3 <18 ng/L    Comment: (NOTE) Elevated high sensitivity troponin I (hsTnI) values and significant  changes across serial measurements may suggest ACS but many other  chronic and acute conditions are known to elevate hsTnI results.  Refer to the Links section for chest pain algorithms  and additional  guidance. Performed at [facility], [address] [place], [place] [address]   Procalcitonin - Baseline     Status: None   Collection Time: 06/09/22  5:44 PM  Result Value Ref Range   Procalcitonin 0.26 ng/mL    Comment:        Interpretation: PCT (Procalcitonin) <= 0.5 ng/mL: Systemic infection (sepsis) is not  likely. Local bacterial infection is possible. (NOTE)       Sepsis PCT Algorithm           Lower Respiratory Tract                                      Infection PCT Algorithm    ----------------------------     ----------------------------         PCT < 0.25 ng/mL                PCT < 0.10 ng/mL          Strongly encourage             Strongly discourage   discontinuation of antibiotics    initiation of antibiotics    ----------------------------     -----------------------------       PCT 0.25 - 0.50 ng/mL            PCT 0.10 - 0.25 ng/mL               OR       >80% decrease in PCT            Discourage initiation of                                            antibiotics      Encourage discontinuation           of antibiotics    ----------------------------     -----------------------------         PCT >= 0.50 ng/mL              PCT 0.26 - 0.50 ng/mL               AND        <80% decrease in PCT             Encourage initiation of                                             antibiotics       Encourage continuation           of antibiotics    ----------------------------     -----------------------------        PCT >= 0.50 ng/mL                  PCT > 0.50 ng/mL               AND         increase in PCT                  Strongly encourage  initiation of antibiotics    Strongly encourage escalation           of antibiotics                                     -----------------------------                                           PCT <= 0.25 ng/mL                                                 OR                                        > 80% decrease in PCT                                      Discontinue / Do not initiate                                             antibiotics  Performed at [facility], [address] [place], [place] [address]   Acetaminophen level     Status: Abnormal   Collection Time: 06/09/22  5:44 PM   Result Value Ref Range   Acetaminophen (Tylenol), Serum <10 (L) 10 - 30 ug/mL    Comment: (NOTE) Therapeutic concentrations vary significantly. A range of 10-30 ug/mL  may be an effective concentration for many patients. However, some  are best treated at concentrations outside of this range. Acetaminophen concentrations >150 ug/mL at 4 hours after ingestion  and >50 ug/mL at 12 hours after ingestion are often associated with  toxic reactions.  Performed at [facility], [address] [place], [place] [address]   Salicylate level     Status: Abnormal   Collection Time: 06/09/22  5:44 PM  Result Value Ref Range   Salicylate Lvl <2.2 (L) 7.0 - 30.0 mg/dL    Comment: Performed at [facility], [address] [place], [place] [address]  Iron and TIBC     Status: Abnormal   Collection Time: 06/09/22  5:44 PM  Result Value Ref Range   Iron 99 28 - 170 ug/dL   TIBC 146 (L) 250 - 450 ug/dL   Saturation Ratios 68 (H) 10.4 - 31.8 %   UIBC 47 ug/dL    Comment: Performed at [facility], [address] [place], [place] [address]  Ferritin (Iron Binding Protein)     Status: Abnormal   Collection Time: 06/09/22  5:44 PM  Result Value Ref Range   Ferritin 1,511 (H) 11 - 307 ng/mL    Comment: Performed at [facility], [address] [place], [place] [address]  TSH     Status: Abnormal   Collection Time: 06/09/22  5:44 PM  Result Value Ref Range   TSH 7.397 (H)  0.350 - 4.500 uIU/mL    Comment: Performed by a 3rd Generation assay with a functional sensitivity of <=0.01 uIU/mL. Performed at [facility], [address] [place], [place] [address]   Magnesium     Status: Abnormal   Collection Time: 06/09/22  5:44 PM  Result Value Ref Range   Magnesium 2.5 (H) 1.7 - 2.4 mg/dL    Comment: Performed at [facility], [address] [place], [place] [address]  Phosphorus     Status: Abnormal   Collection Time: 06/09/22  5:44 PM   Result Value Ref Range   Phosphorus 6.7 (H) 2.5 - 4.6 mg/dL    Comment: Performed at [facility], [address] [place], [place] [address]  hCG, quantitative, pregnancy     Status: None   Collection Time: 06/09/22  5:44 PM  Result Value Ref Range   hCG, Beta Chain, Quant, S 2 <5 mIU/mL    Comment:          GEST. AGE      CONC.  (mIU/mL)   <=1 WEEK        5 - 50     2 WEEKS       50 - 500     3 WEEKS       100 - 10,000     4 WEEKS     1,000 - 30,000     5 WEEKS     3,500 - 115,000   6-8 WEEKS     12,000 - 270,000    12 WEEKS     15,000 - 220,000        FEMALE AND NON-PREGNANT FEMALE:     LESS THAN 5 mIU/mL Performed at [facility], [address] [place], [place] [address]   Protime-INR     Status: Abnormal   Collection Time: 06/09/22  5:44 PM  Result Value Ref Range   Prothrombin Time 17.2 (H) 11.4 - 15.2 seconds   INR 1.4 (H) 0.8 - 1.2    Comment: (NOTE) INR goal varies based on device and disease states. Performed at [facility], [address] [place] [address] [place], [place] [address]   T4, free     Status: None   Collection Time: 06/09/22  5:44 PM  Result Value Ref Range   Free T4 0.63 0.61 - 1.12 ng/dL    Comment: (NOTE) Biotin ingestion may interfere with free T4 tests. If the results are inconsistent with the TSH level, previous test results, or the clinical presentation, then consider biotin interference. If needed, order repeat testing after stopping biotin. Performed at [facility], [address] [place], [place] [address]   Blood Culture (routine x 2)     Status: None (Preliminary result)   Collection Time: 06/09/22  5:48 PM   Specimen: BLOOD  Result Value Ref Range   Specimen Description BLOOD LEFT ANTECUBITAL    Special Requests      BOTTLES DRAWN AEROBIC AND ANAEROBIC Blood Culture adequate volume   Culture      NO GROWTH < 12 HOURS Performed at [facility], [address] [place], [place] [address]     Report Status PENDING   Ammonia     Status: Abnormal   Collection Time: 06/09/22  5:54 PM  Result Value Ref Range   Ammonia 47 (H) 9 - 35 umol/L    Comment: Performed at [facility], [address] [place], [place] [address]  CBG monitoring, ED  Status: Abnormal   Collection Time: 06/09/22  7:49 PM  Result Value Ref Range   Glucose-Capillary >600 (HH) 70 - 99 mg/dL    Comment: Glucose reference range applies only to samples taken after fasting for at least 8 hours.  Beta-hydroxybutyric acid     Status: Abnormal   Collection Time: 06/09/22  8:28 PM  Result Value Ref Range   Beta-Hydroxybutyric Acid >8.00 (H) 0.05 - 0.27 mmol/L    Comment: RESULT CONFIRMED BY MANUAL DILUTION SKL Performed at [facility], [address] [place], [place] [address]   CK     Status: Abnormal   Collection Time: 06/09/22  8:28 PM  Result Value Ref Range   Total CK 20 (L) 38 - 234 U/L    Comment: Performed at [facility], [address] [place], [place] [address]  Resp panel by RT-PCR (RSV, Flu A&B, Covid) Anterior Nasal Swab     Status: None   Collection Time: 06/09/22  8:30 PM   Specimen: Anterior Nasal Swab  Result Value Ref Range   SARS Coronavirus 2 by RT PCR NEGATIVE NEGATIVE    Comment: (NOTE) SARS-CoV-2 target nucleic acids are NOT DETECTED.  The SARS-CoV-2 RNA is generally detectable in upper respiratory specimens during the acute phase of infection. The lowest concentration of SARS-CoV-2 viral copies this assay can detect is 138 copies/mL. A negative result does not preclude SARS-Cov-2 infection and should not be used as the sole basis for treatment or other patient management decisions. A negative result may occur with  improper specimen collection/handling, submission of specimen other than nasopharyngeal swab, presence of viral mutation(s) within the areas targeted by this assay, and inadequate number of viral copies(<138 copies/mL). A negative result  must be combined with clinical observations, patient history, and epidemiological information. The expected result is Negative.  Fact Sheet for Patients:  BloggerCourse.com  Fact Sheet for Healthcare Providers:  SeriousBroker.it  This test is no t yet approved or cleared by the [place] FDA and  has been authorized for detection and/or diagnosis of SARS-CoV-2 by FDA under an Emergency Use Authorization (EUA). This EUA will remain  in effect (meaning this test can be used) for the duration of the COVID-19 declaration under Section 564(b)(1) of the Act, 21 U.S.C.section 360bbb-3(b)(1), unless the authorization is terminated  or revoked sooner.       Influenza A by PCR NEGATIVE NEGATIVE   Influenza B by PCR NEGATIVE NEGATIVE    Comment: (NOTE) The Xpert Xpress SARS-CoV-2/FLU/RSV plus assay is intended as an aid in the diagnosis of influenza from Nasopharyngeal swab specimens and should not be used as a sole basis for treatment. Nasal washings and aspirates are unacceptable for Xpert Xpress SARS-CoV-2/FLU/RSV testing.  Fact Sheet for Patients: BloggerCourse.com  Fact Sheet for Healthcare Providers: SeriousBroker.it  This test is not yet approved or cleared by the [place] FDA and has been authorized for detection and/or diagnosis of SARS-CoV-2 by FDA under an Emergency Use Authorization (EUA). This EUA will remain in effect (meaning this test can be used) for the duration of the COVID-19 declaration under Section 564(b)(1) of the Act, 21 U.S.C. section 360bbb-3(b)(1), unless the authorization is terminated or revoked.     Resp Syncytial Virus by PCR NEGATIVE NEGATIVE    Comment: (NOTE) Fact Sheet for Patients: BloggerCourse.com  Fact Sheet for Healthcare Providers: SeriousBroker.it  This test is not yet approved or  cleared by the [place] FDA and has been authorized for detection and/or diagnosis  of SARS-CoV-2 by FDA under an Emergency Use Authorization (EUA). This EUA will remain in effect (meaning this test can be used) for the duration of the COVID-19 declaration under Section 564(b)(1) of the Act, 21 U.S.C. section 360bbb-3(b)(1), unless the authorization is terminated or revoked.  Performed at [facility], [address] [place], [place] [address]   Lactic acid, plasma     Status: Abnormal   Collection Time: 06/09/22  8:30 PM  Result Value Ref Range   Lactic Acid, Venous 3.9 (HH) 0.5 - 1.9 mmol/L    Comment: CRITICAL VALUE NOTED. VALUE IS CONSISTENT WITH PREVIOUSLY REPORTED/CALLED VALUE SKL Performed at [facility], [address] [place], [place] [address]   Troponin I (High Sensitivity)     Status: None   Collection Time: 06/09/22  8:30 PM  Result Value Ref Range   Troponin I (High Sensitivity) 4 <18 ng/L    Comment: (NOTE) Elevated high sensitivity troponin I (hsTnI) values and significant  changes across serial measurements may suggest ACS but many other  chronic and acute conditions are known to elevate hsTnI results.  Refer to the "Links" section for chest pain algorithms and additional  guidance. Performed at [facility], [address] [place], [place] [address]   Comprehensive metabolic panel     Status: Abnormal   Collection Time: 06/09/22  8:30 PM  Result Value Ref Range   Sodium 134 (L) 135 - 145 mmol/L    Comment: ELECTROLYTES REPEATED TO VERIFY MU   Potassium 5.0 3.5 - 5.1 mmol/L   Chloride 100 98 - 111 mmol/L   CO2 13 (L) 22 - 32 mmol/L   Glucose, Bld 736 (HH) 70 - 99 mg/dL    Comment: CRITICAL RESULT CALLED TO, READ BACK BY AND VERIFIED WITH Dahlia Client KLEIN 2121 06/09/22 MU Glucose reference range applies only to samples taken after fasting for at least 8 hours.    BUN 60 (H) 6 - 20 mg/dL   Creatinine, Ser 9.14 (H) 0.44 -  1.00 mg/dL   Calcium 8.5 (L) 8.9 - 10.3 mg/dL   Total Protein 5.6 (L) 6.5 - 8.1 g/dL   Albumin 3.4 (L) 3.5 - 5.0 g/dL   AST 22 15 - 41 U/L   ALT 13 0 - 44 U/L   Alkaline Phosphatase 78 38 - 126 U/L   Total Bilirubin 1.8 (H) 0.3 - 1.2 mg/dL   GFR, Estimated 29 (L) >60 mL/min    Comment: (NOTE) Calculated using the CKD-EPI Creatinine Equation (2021)    Anion gap 21 (H) 5 - 15    Comment: Performed at [facility], [address] [place] [address] [place], [place] [address]  Type and screen     Status: None (Preliminary result)   Collection Time: 06/09/22  8:30 PM  Result Value Ref Range   ABO/RH(D) A POS    Antibody Screen NEG    Sample Expiration 06/12/2022,2359    Unit Number [id number]    Blood Component Type RED CELLS,LR    Unit division 00    Status of Unit ISSUED    Transfusion Status OK TO TRANSFUSE    Crossmatch Result      Compatible Performed at [facility], [address] [place], [place] [address]   Ethanol     Status: None   Collection Time: 06/09/22  8:30 PM  Result Value Ref Range   Alcohol, Ethyl (B) <10 <10 mg/dL    Comment: (NOTE) Lowest detectable limit for serum alcohol is  10 mg/dL.  For medical purposes only. Performed at [facility], [address] [place], [place] [address]   CBG monitoring, ED     Status: Abnormal   Collection Time: 06/09/22  8:38 PM  Result Value Ref Range   Glucose-Capillary >600 (HH) 70 - 99 mg/dL    Comment: Glucose reference range applies only to samples taken after fasting for at least 8 hours.  Blood gas, arterial     Status: Abnormal   Collection Time: 06/09/22  9:05 PM  Result Value Ref Range   pH, Arterial 7.32 (L) 7.35 - 7.45   pCO2 arterial 28 (L) 32 - 48 mmHg   pO2, Arterial 142 (H) 83 - 108 mmHg   Bicarbonate 14.4 (L) 20.0 - 28.0 mmol/L   Acid-base deficit 10.2 (H) 0.0 - 2.0 mmol/L   O2 Saturation 99.8 %   Patient temperature 37.0    Collection site LEFT RADIAL    Allens test (pass/fail)  PASS PASS    Comment: Performed at [facility], [address] [place], [place] [address]  CBG monitoring, ED     Status: Abnormal   Collection Time: 06/09/22  9:10 PM  Result Value Ref Range   Glucose-Capillary 584 (HH) 70 - 99 mg/dL    Comment: Glucose reference range applies only to samples taken after fasting for at least 8 hours.  CBG monitoring, ED     Status: Abnormal   Collection Time: 06/09/22  9:54 PM  Result Value Ref Range   Glucose-Capillary 515 (HH) 70 - 99 mg/dL    Comment: Glucose reference range applies only to samples taken after fasting for at least 8 hours.   Comment 1 Document in Chart   CBG monitoring, ED     Status: Abnormal   Collection Time: 06/09/22 10:32 PM  Result Value Ref Range   Glucose-Capillary 485 (H) 70 - 99 mg/dL    Comment: Glucose reference range applies only to samples taken after fasting for at least 8 hours.  Urinalysis, Routine w reflex microscopic     Status: Abnormal   Collection Time: 06/09/22 10:59 PM  Result Value Ref Range   Color, Urine STRAW (A) YELLOW   APPearance CLEAR (A) CLEAR   Specific Gravity, Urine 1.016 1.005 - 1.030   pH 5.0 5.0 - 8.0   Glucose, UA >=500 (A) NEGATIVE mg/dL   Hgb urine dipstick SMALL (A) NEGATIVE   Bilirubin Urine NEGATIVE NEGATIVE   Ketones, ur 80 (A) NEGATIVE mg/dL   Protein, ur NEGATIVE NEGATIVE mg/dL   Nitrite NEGATIVE NEGATIVE   Leukocytes,Ua NEGATIVE NEGATIVE   RBC / HPF 0-5 0 - 5 RBC/hpf   WBC, UA 0-5 0 - 5 WBC/hpf   Bacteria, UA NONE SEEN NONE SEEN   Squamous Epithelial / HPF 0-5 0 - 5 /HPF   Mucus PRESENT     Comment: Performed at [facility], [address] [place], [place] [address]  Urine Drug Screen, Qualitative     Status: None   Collection Time: 06/09/22 10:59 PM  Result Value Ref Range   Tricyclic, Ur Screen NONE DETECTED NONE DETECTED   Amphetamines, Ur Screen NONE DETECTED NONE DETECTED   MDMA (Ecstasy)Ur Screen NONE DETECTED NONE DETECTED   Cocaine  Metabolite,Ur [place] NONE DETECTED NONE DETECTED   Opiate, Ur Screen NONE DETECTED NONE DETECTED   Phencyclidine (PCP) Ur S NONE DETECTED NONE DETECTED   Cannabinoid 50 Ng, Ur [place] NONE DETECTED NONE DETECTED   Barbiturates, Ur Screen NONE  DETECTED NONE DETECTED   Benzodiazepine, Ur Scrn NONE DETECTED NONE DETECTED   Methadone Scn, Ur NONE DETECTED NONE DETECTED    Comment: (NOTE) Tricyclics + metabolites, urine    Cutoff 1000 ng/mL Amphetamines + metabolites, urine  Cutoff 1000 ng/mL MDMA (Ecstasy), urine              Cutoff 500 ng/mL Cocaine Metabolite, urine          Cutoff 300 ng/mL Opiate + metabolites, urine        Cutoff 300 ng/mL Phencyclidine (PCP), urine         Cutoff 25 ng/mL Cannabinoid, urine                 Cutoff 50 ng/mL Barbiturates + metabolites, urine  Cutoff 200 ng/mL Benzodiazepine, urine              Cutoff 200 ng/mL Methadone, urine                   Cutoff 300 ng/mL  The urine drug screen provides only a preliminary, unconfirmed analytical test result and should not be used for non-medical purposes. Clinical consideration and professional judgment should be applied to any positive drug screen result due to possible interfering substances. A more specific alternate chemical method must be used in order to obtain a confirmed analytical result. Gas chromatography / mass spectrometry (GC/MS) is the preferred confirm atory method. Performed at [facility], [address] [place], [place] [address]   CBG monitoring, ED     Status: Abnormal   Collection Time: 06/09/22 11:05 PM  Result Value Ref Range   Glucose-Capillary 418 (H) 70 - 99 mg/dL    Comment: Glucose reference range applies only to samples taken after fasting for at least 8 hours.  Basic metabolic panel     Status: Abnormal   Collection Time: 06/09/22 11:20 PM  Result Value Ref Range   Sodium 138 135 - 145 mmol/L   Potassium 4.3 3.5 - 5.1 mmol/L   Chloride 104 98 - 111 mmol/L   CO2 18 (L) 22  - 32 mmol/L   Glucose, Bld 442 (H) 70 - 99 mg/dL    Comment: Glucose reference range applies only to samples taken after fasting for at least 8 hours.   BUN 53 (H) 6 - 20 mg/dL   Creatinine, Ser 0.92 (H) 0.44 - 1.00 mg/dL   Calcium 9.0 8.9 - 33.0 mg/dL   GFR, Estimated 30 (L) >60 mL/min    Comment: (NOTE) Calculated using the CKD-EPI Creatinine Equation (2021)    Anion gap 16 (H) 5 - 15    Comment: Performed at [facility], [address] [place], [place] [address]  CBC     Status: Abnormal   Collection Time: 06/09/22 11:20 PM  Result Value Ref Range   WBC 9.1 4.0 - 10.5 K/uL   RBC 2.33 (L) 3.87 - 5.11 MIL/uL   Hemoglobin 7.0 (L) 12.0 - 15.0 g/dL   HCT 63.3 (L) 35.4 - 56.2 %   MCV 82.0 80.0 - 100.0 fL   MCH 30.0 26.0 - 34.0 pg   MCHC 36.6 (H) 30.0 - 36.0 g/dL   RDW 56.3 89.3 - 73.4 %   Platelets 118 (L) 150 - 400 K/uL   nRBC 0.0 0.0 - 0.2 %    Comment: Performed at [facility], [address] [place], [place] [address]  CBG monitoring, ED     Status: Abnormal   Collection  Time: 06/09/22 11:40 PM  Result Value Ref Range   Glucose-Capillary 380 (H) 70 - 99 mg/dL    Comment: Glucose reference range applies only to samples taken after fasting for at least 8 hours.  CBG monitoring, ED     Status: Abnormal   Collection Time: 06/10/22  1:02 AM  Result Value Ref Range   Glucose-Capillary 294 (H) 70 - 99 mg/dL    Comment: Glucose reference range applies only to samples taken after fasting for at least 8 hours.  Prepare RBC (crossmatch)     Status: None   Collection Time: 06/10/22  1:25 AM  Result Value Ref Range   Order Confirmation      ORDER PROCESSED BY BLOOD BANK Performed at [facility], [address] [place], [place] [address]   Basic metabolic panel     Status: Abnormal   Collection Time: 06/10/22  1:52 AM  Result Value Ref Range   Sodium 137 135 - 145 mmol/L   Potassium 4.3 3.5 - 5.1 mmol/L   Chloride 104 98 - 111 mmol/L   CO2 23 22  - 32 mmol/L   Glucose, Bld 288 (H) 70 - 99 mg/dL    Comment: Glucose reference range applies only to samples taken after fasting for at least 8 hours.   BUN 53 (H) 6 - 20 mg/dL   Creatinine, Ser 6.04 (H) 0.44 - 1.00 mg/dL   Calcium 9.3 8.9 - 54.0 mg/dL   GFR, Estimated 35 (L) >60 mL/min    Comment: (NOTE) Calculated using the CKD-EPI Creatinine Equation (2021)    Anion gap 10 5 - 15    Comment: Performed at [facility], [address] [place], [place] [address]  CBG monitoring, ED     Status: Abnormal   Collection Time: 06/10/22  2:27 AM  Result Value Ref Range   Glucose-Capillary 224 (H) 70 - 99 mg/dL    Comment: Glucose reference range applies only to samples taken after fasting for at least 8 hours.  CBG monitoring, ED     Status: Abnormal   Collection Time: 06/10/22  3:33 AM  Result Value Ref Range   Glucose-Capillary 211 (H) 70 - 99 mg/dL    Comment: Glucose reference range applies only to samples taken after fasting for at least 8 hours.  CBG monitoring, ED     Status: Abnormal   Collection Time: 06/10/22  4:37 AM  Result Value Ref Range   Glucose-Capillary 193 (H) 70 - 99 mg/dL    Comment: Glucose reference range applies only to samples taken after fasting for at least 8 hours.  POC urine preg, ED     Status: None   Collection Time: 06/10/22  4:46 AM  Result Value Ref Range   Preg Test, Ur Negative Negative  Lactic acid, plasma     Status: Abnormal   Collection Time: 06/10/22  4:56 AM  Result Value Ref Range   Lactic Acid, Venous >9.0 (HH) 0.5 - 1.9 mmol/L    Comment: CRITICAL VALUE NOTED. VALUE IS CONSISTENT WITH PREVIOUSLY REPORTED/CALLED VALUE HNM Performed at [facility], [address] [place], [place] [address]   CBC with Differential/Platelet     Status: Abnormal   Collection Time: 06/10/22  4:56 AM  Result Value Ref Range   WBC 4.3 4.0 - 10.5 K/uL   RBC 1.77 (L) 3.87 - 5.11 MIL/uL   Hemoglobin 5.5 (L) 12.0 - 15.0 g/dL   HCT 95.6 (L)  21.3 - 46.0 %  MCV 100.0 80.0 - 100.0 fL    Comment: REPEATED TO VERIFY   MCH 31.1 26.0 - 34.0 pg   MCHC 31.1 30.0 - 36.0 g/dL   RDW 16.1 09.6 - 04.5 %   Platelets 68 (L) 150 - 400 K/uL    Comment: REPEATED TO VERIFY PLATELET COUNT CONFIRMED BY SMEAR    nRBC 0.0 0.0 - 0.2 %   Neutrophils Relative % 90 %   Neutro Abs 3.9 1.7 - 7.7 K/uL   Lymphocytes Relative 7 %   Lymphs Abs 0.3 (L) 0.7 - 4.0 K/uL   Monocytes Relative 2 %   Monocytes Absolute 0.1 0.1 - 1.0 K/uL   Eosinophils Relative 0 %   Eosinophils Absolute 0.0 0.0 - 0.5 K/uL   Basophils Relative 0 %   Basophils Absolute 0.0 0.0 - 0.1 K/uL   Immature Granulocytes 1 %   Abs Immature Granulocytes 0.02 0.00 - 0.07 K/uL    Comment: Performed at [facility], [address] [place], [place] [address]  Glucose, capillary     Status: Abnormal   Collection Time: 06/10/22  5:39 AM  Result Value Ref Range   Glucose-Capillary 179 (H) 70 - 99 mg/dL    Comment: Glucose reference range applies only to samples taken after fasting for at least 8 hours.  MRSA Next Gen by PCR, Nasal     Status: None   Collection Time: 06/10/22  5:43 AM   Specimen: Nasal Mucosa; Nasal Swab  Result Value Ref Range   MRSA by PCR Next Gen NOT DETECTED NOT DETECTED    Comment: (NOTE) The GeneXpert MRSA Assay (FDA approved for NASAL specimens only), is one component of a comprehensive MRSA colonization surveillance program. It is not intended to diagnose MRSA infection nor to guide or monitor treatment for MRSA infections. Test performance is not FDA approved in patients less than 30 years old. Performed at [facility], [address] [place], [place] [address]   Glucose, capillary     Status: Abnormal   Collection Time: 06/10/22  6:40 AM  Result Value Ref Range   Glucose-Capillary 184 (H) 70 - 99 mg/dL    Comment: Glucose reference range applies only to samples taken after fasting for at least 8 hours.  CBC     Status: Abnormal    Collection Time: 06/10/22  7:07 AM  Result Value Ref Range   WBC 7.3 4.0 - 10.5 K/uL   RBC 2.91 (L) 3.87 - 5.11 MIL/uL   Hemoglobin 9.1 (L) 12.0 - 15.0 g/dL    Comment: REPEATED TO VERIFY   HCT 24.8 (L) 36.0 - 46.0 %   MCV 85.2 80.0 - 100.0 fL    Comment: REPEATED TO VERIFY   MCH 31.3 26.0 - 34.0 pg   MCHC 36.7 (H) 30.0 - 36.0 g/dL   RDW 82.9 56.2 - 13.0 %   Platelets 109 (L) 150 - 400 K/uL   nRBC 0.0 0.0 - 0.2 %    Comment: Performed at [facility], [address] [place], [place] [address]  Basic metabolic panel     Status: Abnormal   Collection Time: 06/10/22  7:15 AM  Result Value Ref Range   Sodium 138 135 - 145 mmol/L   Potassium 4.3 3.5 - 5.1 mmol/L   Chloride 106 98 - 111 mmol/L   CO2 24 22 - 32 mmol/L   Glucose, Bld 198 (H) 70 - 99 mg/dL    Comment: Glucose reference range applies only to  samples taken after fasting for at least 8 hours.   BUN 48 (H) 6 - 20 mg/dL   Creatinine, Ser 1.611.71 (H) 0.44 - 1.00 mg/dL   Calcium 8.8 (L) 8.9 - 10.3 mg/dL   GFR, Estimated 41 (L) >60 mL/min    Comment: (NOTE) Calculated using the CKD-EPI Creatinine Equation (2021)    Anion gap 8 5 - 15    Comment: Performed at [facility], [address] [place], [place] [address]  Magnesium     Status: None   Collection Time: 06/10/22  7:15 AM  Result Value Ref Range   Magnesium 1.7 1.7 - 2.4 mg/dL    Comment: Performed at [facility], [address] [place], [place] [address]  Lactic acid, plasma     Status: None   Collection Time: 06/10/22  7:15 AM  Result Value Ref Range   Lactic Acid, Venous 1.2 0.5 - 1.9 mmol/L    Comment: Performed at [facility], [address] [place], [place] [address]  Glucose, capillary     Status: Abnormal   Collection Time: 06/10/22  8:06 AM  Result Value Ref Range   Glucose-Capillary 180 (H) 70 - 99 mg/dL    Comment: Glucose reference range applies only to samples taken after fasting for at least 8 hours.   Glucose, capillary     Status: Abnormal   Collection Time: 06/10/22  9:12 AM  Result Value Ref Range   Glucose-Capillary 193 (H) 70 - 99 mg/dL    Comment: Glucose reference range applies only to samples taken after fasting for at least 8 hours.  Glucose, capillary     Status: Abnormal   Collection Time: 06/10/22 10:04 AM  Result Value Ref Range   Glucose-Capillary 197 (H) 70 - 99 mg/dL    Comment: Glucose reference range applies only to samples taken after fasting for at least 8 hours.  Glucose, capillary     Status: Abnormal   Collection Time: 06/10/22 11:03 AM  Result Value Ref Range   Glucose-Capillary 158 (H) 70 - 99 mg/dL    Comment: Glucose reference range applies only to samples taken after fasting for at least 8 hours.  Lactic acid, plasma     Status: None   Collection Time: 06/10/22 11:37 AM  Result Value Ref Range   Lactic Acid, Venous 1.0 0.5 - 1.9 mmol/L    Comment: Performed at [facility], [address] [place], [place] [address]  Glucose, capillary     Status: Abnormal   Collection Time: 06/10/22 12:23 PM  Result Value Ref Range   Glucose-Capillary 147 (H) 70 - 99 mg/dL    Comment: Glucose reference range applies only to samples taken after fasting for at least 8 hours.    Current Facility-Administered Medications  Medication Dose Route Frequency Provider Last Rate Last Admin   0.9 %  sodium chloride infusion (Manually program via Guardrails IV Fluids)   Intravenous Once Gertha CalkinPatel, Ekta V, MD       cefTRIAXone (ROCEPHIN) 1 g in sodium chloride 0.9 % 100 mL IVPB  1 g Intravenous Q24H Gertha CalkinPatel, Ekta V, MD       Chlorhexidine Gluconate Cloth 2 % PADS 6 each  6 each Topical Daily Wieting, Richard, MD       dextrose 5 % in lactated ringers infusion   Intravenous Continuous Alford HighlandWieting, Richard, MD 75 mL/hr at 06/10/22 1152 Infusion Verify at 06/10/22 1152   dextrose 50 % solution 0-50 mL  0-50 mL  Intravenous PRN Gertha Calkin, MD       hydrocortisone sodium succinate  (SOLU-CORTEF) 100 MG injection 50 mg  50 mg Intravenous Q12H Gertha Calkin, MD   50 mg at 06/10/22 0913   insulin glargine-yfgn [facility]) injection 4 Units  4 Units Subcutaneous BID Alford Highland, MD   4 Units at 06/10/22 1147   insulin regular, human (MYXREDLIN) 100 units/ 100 mL infusion   Intravenous Continuous Irena Cords V, MD 0.9 mL/hr at 06/10/22 1152 0.9 Units/hr at 06/10/22 1152   metoCLOPramide (REGLAN) injection 5 mg  5 mg Intravenous TID AC Wieting, Richard, MD       ondansetron [facility]) injection 4 mg  4 mg Intravenous Q6H PRN Alford Highland, MD   4 mg at 06/10/22 1036   pantoprazole (PROTONIX) injection 40 mg  40 mg Intravenous Q24H Alford Highland, MD   40 mg at 06/10/22 1147    Musculoskeletal: Strength & Muscle Tone: decreased Gait & Station:  Did not observe Patient leans: N/A            Psychiatric Specialty Exam:  Presentation  General Appearance:  Appropriate for Environment  Eye Contact: Good  Speech: Clear and Coherent  Speech Volume: Decreased  Handedness:Right   Mood and Affect  Mood: Depressed  Affect: Congruent   Thought Process  Thought Processes: Coherent  Descriptions of Associations:Intact  Orientation:Full (Time, Place and Person)  Thought Content:Logical  History of Schizophrenia/Schizoaffective disorder:No data recorded Duration of Psychotic Symptoms:No data recorded Hallucinations:Hallucinations: None  Ideas of Reference:None  Suicidal Thoughts:Suicidal Thoughts: No  Homicidal Thoughts:Homicidal Thoughts: No   Sensorium  Memory: Immediate Good; Recent Good  Judgment: Fair  Insight: Fair   Art therapist  Concentration: Good  Attention Span: Good  Recall: Good  Fund of Knowledge: Fair  Language: Fair   Psychomotor Activity  Psychomotor Activity:Psychomotor Activity: Decreased   Assets  Assets: Desire for Improvement; Housing; Resilience   Sleep  Sleep:Sleep:  Fair   Physical Exam: Physical Exam Constitutional:      Appearance: She is ill-appearing.  HENT:     Head: Normocephalic.  Musculoskeletal:     Cervical back: Normal range of motion.  Skin:    General: Skin is dry.  Neurological:     Mental Status: She is alert.    Review of Systems  Constitutional:  Negative for malaise/fatigue.  HENT:  Negative for hearing loss.   Respiratory:  Negative for cough and shortness of breath.   Neurological:  Negative for tremors.  Psychiatric/Behavioral:  Positive for depression. Negative for suicidal ideas.    Blood pressure 121/64, pulse 78, temperature 99.1 F (37.3 C), resp. rate 18, height  (1.549 m), weight 36.9 kg, SpO2 100 %, not currently breastfeeding. Body mass index is 15.37 kg/m.  Treatment Plan Summary: Daily contact with patient to assess and evaluate symptoms and progress in treatment and Plan Will initiate pharmacologic therapy for depression.  Will prescribe Prozac 10 mg orally daily for depression and to increase energy levels.    Disposition: No evidence of imminent risk to self or others at present.   Patient does not meet criteria for psychiatric inpatient admission. Supportive therapy provided about ongoing stressors.  Norma Fredrickson, NP 06/10/2022 12:44 PM

## 2022-06-10 NOTE — Assessment & Plan Note (Addendum)
Type 1 diabetes with DKA on admission.  Sugars have been labile since admission, more steady since tolerating PO intake better.   Sugars are VERY labile, she is extremely insulin sensitive. --Continue 5 units Levemir qHS --Novolog 1 unit TID WC + very sensitive SSI --CBG's Q4H --Adjust for goal 140-180 --Hypoglycemia protocol --Appreciate diabetes coordinator input --Referral on d/c to [facility], Dr. Gabriel Carina.

## 2022-06-10 NOTE — Progress Notes (Signed)
Pt transferred to Rm. 111 at this time. VSS prior to transfer.

## 2022-06-10 NOTE — ED Notes (Signed)
Pt hit call bell for beeping pump d/t potassium infusion being complete. Pt is a GCS of 15. Pt states she remembers everything, was able to hear everything, just was unable to respond or move. NP notified. Pt in NAD at this time.

## 2022-06-10 NOTE — Plan of Care (Signed)
  Problem: Coping: Goal: Ability to adjust to condition or change in health will improve Outcome: Not Progressing Note: Patient profile completed. Patient reports self neglect and neglect from significant other. Patient requests to be placed. Patient admits to not monitoring glucose level due to the absence of strips. Patient cannot afford medications. Transition of care order placed for assistance with needs.

## 2022-06-10 NOTE — Assessment & Plan Note (Addendum)
Sodium initially low secondary to diabetic ketoacidosis.  Last sodium in the normal range.

## 2022-06-10 NOTE — Evaluation (Signed)
Physical Therapy Evaluation Patient Details Name: Ashley Camacho MRN: [id number] DOB: 1990/06/27 Today's Date: 06/10/2022  History of Present Illness  Ashley Camacho is a 31yoF who comes to [facility] on 06/09/22 c confusion and weakness. PMH: DM1, DKA, CKD, HTN, gastroparesis, adrenal insufficiency. In ED, BP 68/78mmHg. Per chart pt has been hypersomnolent, anorexic.  Clinical Impression  Pt in bed on entry, cleared for eval by RN. Pt has been alert today, but generally hypoactive, she says she feels 'really tired.' Pt denies any pain. She provides updates to home setup. Pt has been caring for 2 young children, youngest is 21 year old, however has had several years of fluctuating orthostatic dizziness which has progressed to syncope episodes for past 4 weeks or so. RN asks for orthos to be assessed in session, cannot be completed due to return of N/V while seated EOB. DC recs difficult at this time, workup still pending, unclear etiology and prognosis for many of pt's medical issues here, unclear what to expect in terms of resolution prior to DC, however many of these seem concerningly chronic and progressive which does not bode well in terms of anticipated improvements over the short term. Per pt reports, social support has been inadequate without any frank sign of improvement over the short term.         Recommendations for follow up therapy are one component of a multi-disciplinary discharge planning process, led by the attending physician.  Recommendations may be updated based on patient status, additional functional criteria and insurance authorization.  Follow Up Recommendations Other (comment) (workup still pending, more info needed to determine PT prognosis)      Assistance Recommended at Discharge Intermittent Supervision/Assistance  Patient can return home with the following  A little help with walking and/or transfers;Assistance with cooking/housework;Assistance with feeding;Help with stairs  or ramp for entrance;Assist for transportation;Direct supervision/assist for medications management;Direct supervision/assist for financial management    Equipment Recommendations Other (comment)  Recommendations for Other Services       Functional Status Assessment Patient has had a recent decline in their functional status and demonstrates the ability to make significant improvements in function in a reasonable and predictable amount of time.     Precautions / Restrictions Precautions Precautions: Fall Restrictions Weight Bearing Restrictions: No      Mobility  Bed Mobility Overal bed mobility: Needs Assistance Bed Mobility: Supine to Sit, Sit to Supine     Supine to sit: Modified independent (Device/Increase time) Sit to supine: Modified independent (Device/Increase time)   General bed mobility comments: nausea returns immediately upon sitting EOB, does not improve over 2-3 minutes seated    Transfers Overall transfer level:  (unable to attempt due to active low volume emesis.)                      Ambulation/Gait                  Stairs            Wheelchair Mobility    Modified Rankin (Stroke Patients Only)       Balance Overall balance assessment: Modified Independent, History of Falls                                           Pertinent Vitals/Pain Pain Assessment Pain Assessment: No/denies pain    Home Living Family/patient expects to  be discharged to:: Private residence Living Arrangements: Children (father of children, does not help much unless needed; 67yo and 2 yo children live at home; mothe rlives in [place], does not drive)   Type of Home: House Home Access: Stairs to enter Entrance Stairs-Rails: Right Entrance Stairs-Number of Steps: 3 Alternate Level Stairs-Number of Steps: typically does not go upstairs Home Layout: Two level;Able to live on main level with bedroom/bathroom Home Equipment: None       Prior Function               Mobility Comments: pt has been having dizziness issues with syncope over past month or so, prior to that several years of intermittent dizziness ADLs Comments: Has been limited due to chronic dizziness, syncope at home, uses baby wipes to clean self,  no other assisting adults.     Hand Dominance        Extremity/Trunk Assessment                Communication   Communication:  (kids' father provides transpotation for kids as needed.)  Cognition Arousal/Alertness: Awake/alert (hypophonic, hypoactive, flat affect) Behavior During Therapy: WFL for tasks assessed/performed Overall Cognitive Status: Within Functional Limits for tasks assessed                                          General Comments      Exercises     Assessment/Plan    PT Assessment Patient needs continued PT services  PT Problem List Decreased strength;Decreased range of motion;Decreased activity tolerance;Decreased balance;Decreased mobility;Decreased coordination;Decreased cognition;Decreased knowledge of use of DME;Decreased safety awareness;Decreased knowledge of precautions       PT Treatment Interventions DME instruction;Gait training;Stair training;Functional mobility training;Therapeutic activities;Therapeutic exercise;Balance training;Neuromuscular re-education;Patient/family education    PT Goals (Current goals can be found in the Care Plan section)  Acute Rehab PT Goals Patient Stated Goal: stop passing out, fix dizziness issue, be done with emesis PT Goal Formulation: With patient Time For Goal Achievement: 06/24/22 Potential to Achieve Goals: Fair    Frequency Min 2X/week     Co-evaluation               AM-PAC PT "6 Clicks" Mobility  Outcome Measure Help needed turning from your back to your side while in a flat bed without using bedrails?: A Little Help needed moving from lying on your back to sitting on the side of a  flat bed without using bedrails?: A Little Help needed moving to and from a bed to a chair (including a wheelchair)?: A Little Help needed standing up from a chair using your arms (e.g., wheelchair or bedside chair)?: A Little Help needed to walk in hospital room?: A Lot Help needed climbing 3-5 steps with a railing? : A Lot 6 Click Score: 16    End of Session   Activity Tolerance: Treatment limited secondary to medical complications (Comment) Patient left: in bed;with call bell/phone within reach Nurse Communication: Mobility status PT Visit Diagnosis: Difficulty in walking, not elsewhere classified (R26.2);Other abnormalities of gait and mobility (R26.89);Repeated falls (R29.6);Adult, failure to thrive (R62.7);Dizziness and giddiness (R42);Other symptoms and signs involving the nervous system (R29.898)    Time: 4196-2229 PT Time Calculation (min) (ACUTE ONLY): 30 min   Charges:   PT Evaluation $PT Eval High Complexity: 1 High         2:38 PM, 06/10/22 Jackquline Berlin  Dario Ave, PT, DPT Physical Therapist - [facility]  [phone] ([place])    [place] 06/10/2022, 2:31 PM

## 2022-06-10 NOTE — Assessment & Plan Note (Addendum)
Appreciate psychiatric consultation.  Prozac started. Resumed on Remeron for sleep and appetite as well.

## 2022-06-10 NOTE — Assessment & Plan Note (Addendum)
Status post transfusion of 2 units pRBC's  (on admission, and on 1/22) earlier this admission.   Hbg has slowly trended down to 6.9. No evidence of bleeding. --Transfused 1 unit pRBC's on 2/13 --Monitor CBC, transfuse PRN if < 7 --Recommend Hematology to follow after discharge for outpatient transfusions if needed.

## 2022-06-10 NOTE — Progress Notes (Signed)
   06/10/22 1000  Spiritual Encounters  Type of Visit Initial  Care provided to: Patient  Referral source Nurse (RN/NT/LPN)  Reason for visit Advance directives  OnCall Visit No   Chaplain responded to nurse consult for Advance Directives. Chaplain provided education on completing AD. New Chaplain resident present. Chaplain services are available for follow up as needed.

## 2022-06-10 NOTE — Progress Notes (Signed)
Lab personnel Ashley Camacho) notified RN that the lab collection (metabolic panel) may have been contaminated, will recollect.

## 2022-06-10 NOTE — Consult Note (Addendum)
GI Inpatient Consult Note  Reason for Consult:   Attending Requesting Consult:  History of Present Illness: Ashley Camacho is a 32 y.o. female seen for evaluation of weight loss at the request of Dr. Renae Gloss . Patient has a PMH of type 1 diabetes, DKA, CKD III, HTN,  diabetic gastroparesis, polyneuropathy adrenal insufficiency, depression, hypothyroidism, severe protein -calorie malnutrition, anemia of chronic disease requiring transfusions, thrombocytopenia, cardiomyopathy with EF 30%, history of right brachial DVT,  hepatocellular transaminitis (06/15/2021), chronic heartburn,  history of unexplained nausea vomiting associated anorexia- no improvement with previous treatment Remeron, Reglan, Zofran, scopolamine patch, Protonix, erythromycin avoids NSAID use.  She was treated with oral feedings via her nose with Cortrak while  pregnant in 2022.   She presented to the [facility] ED yesterday for chief complaint of increased weakness, vomiting, lethargy with DKA. Onset has been progressive this week. Patient is noncompliant with medication regimen for mental health and medical co morbidities. She reports that she lost her insurance.  Upon presentation to the ED, vital signs were  BP 68/52, 99, 22, SpO2 100%. Labs were significant for  WBC 10.4, Hgb 8.2, plt 129, sodium 132, glucose 887, BUN 63, creatinine 2.58, estimated GFR 25% down from 32,  bilirubin 2.3 up form 1.5, AST 23 8, ALT 15, alkaline phosphatase 101, anion gap 27, lactic acid 3.4, procalcitonin 0.26, iron sat 68, ferritin 1511, TSH 7.39, HCG negative. CT head showed no acute intracranial abnormality noted. Air-fluid level in left maxillary antrum consistent with acute sinusitis. CXR: No acute cardiopulmonary disease.  Patient was admitted to the ICU for insulin drip with DKA.  Her insulin drip was DC'd today.  Her diet has been advanced but patient is taking nothing by mouth. Reports significant heartburn, but has been off of her pantoprazole  since October.  Patient reports she has been unable to eat much.  No food in 2 days.  Prior to that she ate a banana.  She does exist on a microwave dinner once per day.  She reports gastroparesis is quite severe.  She has had no improvement with Reglan in the past.  She does have nausea, intermittent dry heaves for a long time.  Has any discrete abdominal pain.  Sometimes when she is vomited or red she has epigastric discomfort that resolves. She denies coffee-ground emesis or  melena.  She does have chronic fatigue and intermittent lightheaded for months.  She sates she  has had difficulty caring for her 21-year-old and 32 year old and has requested assisted living placement to regain her strength. She has been told she has anemia and has not been taking oral iron.  Reports her last menstrual period was very light in October 1 day.  History of BTL.  No history of vaginal or rectal bleeding. Weight reviewed during admission with weight of 51 kg in June, 42.7 kg  in October, and admission now is 36.9 kg.  Also reports depression and she was seen by psychiatric NP today and was advised to start 10 mg Prozac she has been unable to take due to her nausea and vomiting.  Denies any dysphagia, odynophagia, nausea.  We have EGD in 2022 with summary below, most recent EGD attempt was March 2023 with large amount of food found in the entire stomach.  She cannot recall when she last had a bowel movement.  She does not eat regularly and has formed stool.  No diarrhea.  Patient is a reluctant and vague historian.  - Admission in  January with severe hepatic cellular transaminitis thought secondary to ischemic hepatitis given quick recovery with IV fluid resuscitation.   Abdominal  ultrasound 06/14/21 with tubal gallstones up to 8 mm diameter.  Negative Murphy sign.  No biliary ductal dilatation.  Within normal parenchymal echogenicity.  Normal Doppler blood flow.  CT of the abdomen pelvis 06/14/2021 with marked diffuse body  wall edema compatible with anasarca.  Interval increase in abdominal pelvic ascites.  Decrease in size of ventral abdominal wall fluid collection/hematoma.  Bilateral pleural effusions right leg greater than left.  Gallstones with mild gallbladder wall edema.  Nonspecific in setting of ascites.  Right-sided pelvocaliectasis no urinary tract calculi identified.  Last Colonoscopy: none EGD 01/14/21 - LA Grade C reflux esophagitis with esophageal biopsies showing ulceration with granulation tissue, gastritis, normal duodenum with negative duodenal biopsies   Last Endoscopy: 07/31/2021: [facility]: Indication odynophagia, history of gastroparesis: Impression: Esophagitis was found in the distal esophagus both at the level of the GE junction and slightly proximal.  Large amount of food was found in the entire examined stomach.  This limited mucosal exam.  Retroflexion was not performed.  Bulb was briefly examined to exclude ulcer or source of outflow obstruction and was normal.   Past Medical History:  Past Medical History:  Diagnosis Date   Adrenal insufficiency ([place])    Anemia    Gastroparesis    Hypertension    Type 1 diabetes ([place])     Problem List: Patient Active Problem List   Diagnosis Date Noted   Protein-calorie malnutrition, severe 06/10/2022   Hypotension 62/83/1517   Acute metabolic encephalopathy 61/60/7371   Pseudohyponatremia 06/10/2022   Hypomagnesemia 06/10/2022   Underweight 06/10/2022   Adrenal insufficiency ([place]) 06/09/2022   Acute gastroenteritis 02/28/2022   Hypokalemia 02/28/2022   Hypocalcemia 11/17/9483   Metabolic acidosis 46/27/0350   Essential hypertension 02/28/2022   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   Nausea & vomiting 02/28/2022   UTI (urinary tract infection) 11/03/2021   Hypoglycemia due to type 1 diabetes mellitus ([place]) 11/02/2021   Chronic kidney disease, stage 3b ([place]) 11/02/2021   Thrombocytopenia ([place]) 11/02/2021   Hypoglycemia due to insulin  06/14/2021   DKA (diabetic ketoacidosis) ([place]) 06/14/2021   CKD stage 3 due to type 1 diabetes mellitus ([place]) 06/14/2021   Transaminitis 06/14/2021   Hypoglycemia associated with diabetes ([place]) 06/03/2021   DKA, type 1 ([place]) 05/20/2021   Moderate recurrent major depression ([place]) 05/08/2021   Right arm pain 05/07/2021   Rash of hand 05/07/2021   Hyperkalemia 05/06/2021   History of adrenal insufficiency 05/06/2021   CAP (community acquired pneumonia) 05/05/2021   Hypoglycemia 04/27/2021   Postoperative hematoma involving genitourinary system following genitourinary procedure    S/P cesarean section 02/17/2021   Acute esophagitis    Leg edema    History of preterm delivery, currently pregnant    Pre-existing type 1 diabetes mellitus during pregnancy in third trimester    Anemia of chronic disease    Type 1 diabetes mellitus affecting pregnancy in second trimester, antepartum    HFrEF (heart failure with reduced ejection fraction) ([place])    Acute deep vein thrombosis (DVT) of brachial vein of right upper extremity ([place]) 12/17/2020   Anxiety 12/17/2020   Diabetic retinopathy ([place]) 12/17/2020   Acute on chronic combined systolic and diastolic CHF (congestive heart failure) ([place])    Hyperemesis 12/13/2020   Type 1 diabetes mellitus with hypoglycemia without coma ([place]) 12/13/2020   Sunburn 12/13/2020   Hypothyroidism 12/10/2020   Malnutrition ([place])  12/10/2020   Vitreous hemorrhage of right eye ([place]) 12/10/2020   Pleural effusion associated with pulmonary infection 11/25/2020   Pneumonia affecting pregnancy 11/24/2020   Diabetes mellitus affecting pregnancy, second trimester 11/24/2020   Edema 11/24/2020   Decreased urine output 11/24/2020   Shortness of breath    Zinc deficiency    SOB (shortness of breath)    Social problem 11/21/2020   Nausea and vomiting during pregnancy prior to [redacted] weeks gestation 11/21/2020   Low serum vitamin B12    Delivery with history of C-section  11/20/2020   Absolute anemia    Acute kidney injury superimposed on chronic kidney disease (HCC)    Nausea and vomiting during pregnancy 10/23/2020   History of premature delivery, currently pregnant, second trimester 10/13/2020   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   Depression 09/28/2018   Peripheral neuropathy 09/28/2018   Diabetic gastroparesis ([place]) 04/22/2016   Diabetic sensorimotor neuropathy ([place]) 04/19/2016   DKA (diabetic ketoacidoses) 04/03/2016   Microalbuminuria 09/26/2005    Past Surgical History: Past Surgical History:  Procedure Laterality Date   BIOPSY  01/14/2021   Procedure: BIOPSY;  Surgeon: Thornton Park, MD;  Location: [facility];  Service: Gastroenterology;;   CESAREAN SECTION     CESAREAN SECTION WITH BILATERAL TUBAL LIGATION N/A 02/17/2021   Procedure: CESAREAN SECTION WITH BILATERAL TUBAL LIGATION;  Surgeon: Truett Mainland, DO;  Location: [facility];  Service: Obstetrics;  Laterality: N/A;   ESOPHAGOGASTRODUODENOSCOPY (EGD) WITH PROPOFOL N/A 01/14/2021   Procedure: ESOPHAGOGASTRODUODENOSCOPY (EGD) WITH PROPOFOL;  Surgeon: Thornton Park, MD;  Location: [facility];  Service: Gastroenterology;  Laterality: N/A;   MOUTH SURGERY      Allergies: No Known Allergies  Home Medications: Medications Prior to Admission  Medication Sig Dispense Refill Last Dose   acetaminophen (TYLENOL) 500 MG tablet Take 2 tablets (1,000 mg total) by mouth every 6 (six) hours as needed for headache, fever or moderate pain. 30 tablet 3 prn   bumetanide (BUMEX) 2 MG tablet Take 2 mg by mouth 2 (two) times daily. (Patient not taking: Reported on 06/09/2022)   Not Taking   carvedilol (COREG) 12.5 MG tablet Take 12.5 mg by mouth 2 (two) times daily. (Patient not taking: Reported on 06/09/2022)   Not Taking   hydrocortisone (CORTEF) 10 MG tablet Take 1-2 tablets (10-20 mg total) by mouth 2 (two) times daily. Take 2 tablets (20 mg) in the morning and 1 tablet (10 mg) in the evening.  (Patient not taking: Reported on 06/09/2022)   Not Taking   insulin aspart (NOVOLOG) 100 UNIT/ML injection Inject 2 Units into the skin 3 (three) times daily with meals. (Patient not taking: Reported on 06/09/2022) 10 mL 0 Not Taking   insulin glargine (LANTUS) 100 UNIT/ML injection Inject 0.04 mLs (4 Units total) into the skin 2 (two) times daily. Inject 4 units in the morning and 4 units in the evening (Patient taking differently: Inject 4-6 Units into the skin 2 (two) times daily. Inject 4 units in the morning and 4 units in the evening. May add additional 2 units for large snacks once a day.) 10 mL 0    Insulin Syringe-Needle U-100 (BD INSULIN SYRINGE U/F) 31G X 5/16" 0.3 ML MISC Use as directed 5 times daily with Lantus and Novolog. 60 each 0    levothyroxine (SYNTHROID) 25 MCG tablet Take 1 tablet (25 mcg total) by mouth daily before breakfast. 30 tablet 0    pantoprazole (PROTONIX) 40 MG tablet Take 1 tablet by mouth 2 (  two) times daily. (Patient not taking: Reported on 06/09/2022)   Not Taking   Home medication reconciliation was completed with the patient.   Scheduled Inpatient Medications:    sodium chloride   Intravenous Once   Chlorhexidine Gluconate Cloth  6 each Topical Daily   feeding supplement  237 mL Oral TID BM   FLUoxetine  10 mg Oral Daily   hydrocortisone sod succinate (SOLU-CORTEF) inj  50 mg Intravenous Q12H   insulin aspart  0-5 Units Subcutaneous QHS   insulin aspart  0-6 Units Subcutaneous TID WC   [START ON 06/11/2022] insulin glargine-yfgn  4 Units Subcutaneous Daily   [START ON 06/11/2022] levothyroxine  50 mcg Oral Q0600   metoCLOPramide (REGLAN) injection  5 mg Intravenous TID AC   multivitamin with minerals  1 tablet Oral Daily   pantoprazole (PROTONIX) IV  40 mg Intravenous Q24H    Continuous Inpatient Infusions:    cefTRIAXone (ROCEPHIN)  IV     lactated ringers      PRN Inpatient Medications:  dextrose, ondansetron (ZOFRAN) IV  Family History: family  history includes Breast cancer in her mother; CAD in her father, paternal grandfather, and paternal grandmother; Diabetes type I in her father.  The patient's family history is negative for inflammatory bowel disorders, GI malignancy, or solid organ transplantation.  Social History:   reports that she has never smoked. She has never used smokeless tobacco. She reports that she does not drink alcohol and does not use drugs. The patient denies ETOH, tobacco, or drug use.   Review of Systems: Constitutional: +Weight loss  Eyes: No changes in vision. ENT: No oral lesions, sore throat.  GI: see HPI.  Heme/Lymph: + easy bruising and gum bleeding per pt CV: No chest pain.  GU: No hematuria.  Integumentary: No rashes.  Neuro: No headaches.  Notes dizziness/lightheadedness upon standing Psych: Pos depression/anxiety.  Endocrine: Pos heat/cold intolerance.  Allergic/Immunologic: No urticaria.  Resp: No cough, SOB.  Musculoskeletal: No joint swelling.    Physical Examination: BP 119/63   Pulse 77   Temp 99.5 F (37.5 C)   Resp 15   Ht 5\' 1"  (1.549 m)   Wt 36.9 kg   SpO2 100%   BMI 15.37 kg/m  Gen: Cachetic, frail, pale, flat affect, speech is quiet, vague and disinterested historian, alert and oriented x 4 HEENT:  EOMI, Neck: supple, no JVD or thyromegaly Chest: CTA bilaterally, no wheezes, crackles, or other adventitious sounds CV: RRR, no m/g/c/r Abd: soft, NT, ND, +BS in all four quadrants; no HSM, guarding, rigidity, or rebound tenderness Ext: no edema, well perfused with 2+ pulses, muscle atrophy noted Skin: no rash or lesions noted  Data: Lab Results  Component Value Date   WBC 7.3 06/10/2022   HGB 9.1 (L) 06/10/2022   HCT 24.8 (L) 06/10/2022   MCV 85.2 06/10/2022   PLT 109 (L) 06/10/2022   Recent Labs  Lab 06/09/22 2320 06/10/22 0456 06/10/22 0707  HGB 7.0* 5.5* 9.1*   Lab Results  Component Value Date   NA 138 06/10/2022   K 4.3 06/10/2022   CL 106 06/10/2022    CO2 24 06/10/2022   BUN 48 (H) 06/10/2022   CREATININE 1.71 (H) 06/10/2022   Lab Results  Component Value Date   ALT 13 06/09/2022   AST 22 06/09/2022   GGT 39 06/15/2021   ALKPHOS 78 06/09/2022   BILITOT 1.8 (H) 06/09/2022   Recent Labs  Lab 06/09/22 1744  INR 1.4*  Assessment/Plan: Ms. Kobel is a 32 y.o. female is seen for evaluation of weight loss at the request of Dr. Renae Gloss . Patient has a PMH of type 1 diabetes, DKA, CKD III, HTN,  diabetic gastroparesis, polyneuropathy adrenal insufficiency, depression, hypothyroidism, severe protein -calorie malnutrition, anemia of chronic disease requiring transfusions, thrombocytopenia, cardiomyopathy with EF 30%, history of right brachial DVT,  hepatocellular transaminitis (06/15/2021), chronic heartburn he did with pantoprazole, history of unexplained nausea vomiting associated anorexia, no improvement with previous treatment Remeron, Reglan, Zofran, scopolamine patch, Protonix, erythromycin avoids NSAID use. Patient is noncompliant with medication regimen for mental health and medical co morbidities. Upon presentation to the ED, vital signs were hypotensive, hypoglycemic, and acidotic with DKA.   Recommendations: # Progressive abnormal weight loss #  Chronic persistent Nausea and vomiting   # DKA on hospital presentation; # Diabetic gastroparesis - chronic # Depression # Medication noncompliance  # Elevated ferritin and mild hyperbilirubinemia # CKD- Acute on chronic  # Anemia acute on chronic with elevated iron stores # Severe malnutrition Multiple causes for weight loss with very poor intake, recurrent nausea and vomiting and anorexia from severe multiple  untreated medical and psychiatric  problems. - No vomiting overnight. Positive nausea and dry heaves-during the day - DKA - euglycemic now and insulin drip discontinued today.  - Advance diet- pt is taking nothing by mouth, severe malnutrition. Strongly consider tube  feedings. - Previous EGD x 2 and concurrent organic GI disease has been previously shown to have esophagitis, gastritis, severe gastroparesis.  Negative for PUD,  gastric outlet obstruction, pancreatitis.Likely functional component, as well.  - No plans for luminal evaluation at present time with recent EGD in 2022 and 2023  - Treat with PPI, Reglan, check H pylori,   - When she is tolerating oral feeds, consider small frequent meals, antiemetics per primary team. - Agree with  referral to psychiatry as she is very withdrawn on exam and has untreated anxiety and depression. She has multiple hospital admissions for hypoglycemia and DKA.  - Recommend social service referral  - Recommend Nutrition consult  - For elevated ferritin and t bili - check HFE and monitor LFTs.  Current normal Tylenol salicylate and alcohol.  - Anemia and received 1 unit PRBC.  Monitor CBC per primary team.  No overt signs of GI bleed at this time.  Question of coffee-ground emesis to arrival.Monitor for GIB.   Thank you for the consult. Please call with questions or concerns.  Amedeo Kinsman, ANP [facility] [phone]

## 2022-06-10 NOTE — Assessment & Plan Note (Addendum)
Replaced & resolved. Continue to monitor.

## 2022-06-10 NOTE — Assessment & Plan Note (Deleted)
Received 1 unit of blood for hemoglobin of 7.0.  Hemoglobin up to 9.1.  (The hemoglobin of 5.5 was a lab error).

## 2022-06-10 NOTE — Assessment & Plan Note (Deleted)
Jejunostomy tube yesterday.  Starting tube feeding.

## 2022-06-10 NOTE — TOC Initial Note (Signed)
Transition of Care [facility]) - Initial/Assessment Note    Patient Details  Name: Ashley Camacho MRN: [id number] Date of Birth: [birth year]  Transition of [facility]) CM/SW Contact:    Shelbie Hutching, RN Phone Number: 06/10/2022, 3:39 PM  Clinical Narrative:                 Patient admitted to the hospital with DKA.  RNCM met with patient at the bedside, patient has also been seen by nutrition and diabetes coordinator.  Psychiatry has also been consulted.    Patient is from home where she lives with her former boyfriend/significant other and her 2 children, daughter Ashley Camacho 32 years old, and daughter Ashley Camacho 32 year old.  Patient is extremely weak and thin, emaciated, she reports not eating and not being able to get out of the bed.  She has been afraid that if she did get out of the bed she would pass out.  She has been relying on her 32 year old to help her with the 32 year old.  Her ex Ashley Camacho, will not help her with the children when she is home, she reports that he feels it is her job.   She does say that when she is not there he does take care of them and is good to them, she is not worried about their safety.   She does not work, she does not have friends or family, she does not drive.  She has a mom that lives in [place] but that she cannot live with and is not supportive.   Patient does not currently have Medicaid and has not been taking her medications.  She said that she did have some insulin saved, Lantus and novolog, she has not been checking her blood sugars.  She said she would take 5 units of Lantus and 3-4 units of the Novolog.  Ashley Camacho recently lost his job and their car was repossessed.  His brother will provide transportation at times.  She reports that Ashley Camacho is not home much during the day, she does not know where he goes.  Ashley Camacho was the one who brought her to the hospital.  RNCM has called and made an APS report out of concern for patient's wellbeing and ability to care for herself and her  children.    Patient gets $609 per month in SSDI, TOC will see what placement options can be found.   Expected Discharge Plan:  (TBD) Barriers to Discharge: Continued Medical Work up   Patient Goals and CMS Choice Patient states their goals for this hospitalization and ongoing recovery are:: Patient would like to be placed somewhere that can help her care for herself          Expected Discharge Plan and Services In-house Referral: Development worker, community, Nutrition Discharge Planning Services: CM Consult   Living arrangements for the past 2 months: Single Family Home                                      Prior Living Arrangements/Services Living arrangements for the past 2 months: Single Family Home Lives with:: Minor Children, Other (Comment) Patient language and need for interpreter reviewed:: Yes Do you feel safe going back to the place where you live?: No   patient cannot take care of herself  Need for Family Participation in Patient Care: Yes (Comment) Care giver support system in place?: No (comment)  Criminal Activity/Legal Involvement Pertinent to Current Situation/Hospitalization: No - Comment as needed  Activities of Daily Living Home Assistive Devices/Equipment: CBG Meter ADL Screening (condition at time of admission) Patient's cognitive ability adequate to safely complete daily activities?: No Is the patient deaf or have difficulty hearing?: No Does the patient have difficulty seeing, even when wearing glasses/contacts?: No Does the patient have difficulty concentrating, remembering, or making decisions?: No Patient able to express need for assistance with ADLs?: Yes Does the patient have difficulty dressing or bathing?: Yes Independently performs ADLs?: No Communication: Independent Dressing (OT): Dependent Is this a change from baseline?: Pre-admission baseline Grooming: Dependent Is this a change from baseline?: Pre-admission baseline Feeding:  Independent Bathing: Dependent Is this a change from baseline?: Pre-admission baseline Toileting: Dependent Is this a change from baseline?: Pre-admission baseline In/Out Bed: Dependent Is this a change from baseline?: Pre-admission baseline Walks in Home: Independent Does the patient have difficulty walking or climbing stairs?: Yes Weakness of Legs: Both Weakness of Arms/Hands: Both  Permission Sought/Granted                  Emotional Assessment Appearance:: Disheveled, Appears stated age Attitude/Demeanor/Rapport: Engaged Affect (typically observed): Accepting, Quiet Orientation: : Oriented to Self, Oriented to Place, Oriented to  Time, Oriented to Situation Alcohol / Substance Use: Not Applicable Psych Involvement: Yes (comment)  Admission diagnosis:  Hypovolemic shock ([place]) [R57.1] Adrenal insufficiency (HCC) [E27.40] Type 1 diabetes mellitus with ketoacidotic coma (HCC) [E10.11] Stage 3b chronic kidney disease ([place]) [N18.32] AMS (altered mental status) [R41.82] Patient Active Problem List   Diagnosis Date Noted   Protein-calorie malnutrition, severe 06/10/2022   Hypotension 03/50/0938   Acute metabolic encephalopathy 18/29/9371   Pseudohyponatremia 06/10/2022   Hypomagnesemia 06/10/2022   Underweight 06/10/2022   Adrenal insufficiency ([place]) 06/09/2022   Acute gastroenteritis 02/28/2022   Hypokalemia 02/28/2022   Hypocalcemia 69/67/8938   Metabolic acidosis 03/09/5101   Essential hypertension 02/28/2022   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   Nausea & vomiting 02/28/2022   UTI (urinary tract infection) 11/03/2021   Hypoglycemia due to type 1 diabetes mellitus ([place]) 11/02/2021   Chronic kidney disease, stage 3b ([place]) 11/02/2021   Thrombocytopenia ([place]) 11/02/2021   Hypoglycemia due to insulin 06/14/2021   DKA (diabetic ketoacidosis) ([place]) 06/14/2021   CKD stage 3 due to type 1 diabetes mellitus ([place]) 06/14/2021   Transaminitis 06/14/2021   Hypoglycemia  associated with diabetes ([place]) 06/03/2021   DKA, type 1 ([place]) 05/20/2021   Moderate recurrent major depression ([place]) 05/08/2021   Right arm pain 05/07/2021   Rash of hand 05/07/2021   Hyperkalemia 05/06/2021   History of adrenal insufficiency 05/06/2021   CAP (community acquired pneumonia) 05/05/2021   Hypoglycemia 04/27/2021   Postoperative hematoma involving genitourinary system following genitourinary procedure    S/P cesarean section 02/17/2021   Acute esophagitis    Leg edema    History of preterm delivery, currently pregnant    Pre-existing type 1 diabetes mellitus during pregnancy in third trimester    Anemia of chronic disease    Type 1 diabetes mellitus affecting pregnancy in second trimester, antepartum    HFrEF (heart failure with reduced ejection fraction) ([place])    Acute deep vein thrombosis (DVT) of brachial vein of right upper extremity ([place]) 12/17/2020   Anxiety 12/17/2020   Diabetic retinopathy ([place]) 12/17/2020   Acute on chronic combined systolic and diastolic CHF (congestive heart failure) ([place])    Hyperemesis 12/13/2020   Type 1 diabetes mellitus with hypoglycemia without coma ([place]) 12/13/2020  Sunburn 12/13/2020   Hypothyroidism 12/10/2020   Malnutrition (HCC) 12/10/2020   Vitreous hemorrhage of right eye (HCC) 12/10/2020   Pleural effusion associated with pulmonary infection 11/25/2020   Pneumonia affecting pregnancy 11/24/2020   Diabetes mellitus affecting pregnancy, second trimester 11/24/2020   Edema 11/24/2020   Decreased urine output 11/24/2020   Shortness of breath    Zinc deficiency    SOB (shortness of breath)    Social problem 11/21/2020   Nausea and vomiting during pregnancy prior to [redacted] weeks gestation 11/21/2020   Low serum vitamin B12    Delivery with history of C-section 11/20/2020   Absolute anemia    Acute kidney injury superimposed on chronic kidney disease (HCC)    Nausea and vomiting during pregnancy 10/23/2020   History of premature  delivery, currently pregnant, second trimester 10/13/2020   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 [place] [address] Phone: [phone] Fax: [phone]  [facility] [address] [place] [address] Phone: [phone] Fax: [phone]     Social Determinants of Health (SDOH) Social History: SDOH Screenings   Food Insecurity: No Food Insecurity (06/10/2022)  Housing: Low Risk  (06/10/2022)  Transportation Needs: No Transportation Needs (06/10/2022)  Utilities: Not At Risk (06/10/2022)  Depression (PHQ2-9): Low Risk  (11/11/2020)  Tobacco Use: Low Risk  (02/28/2022)   SDOH Interventions:     Readmission Risk Interventions    11/03/2021   10:37 AM 06/16/2021   12:30 PM 06/03/2021    4:26 PM  Readmission Risk Prevention Plan  Transportation Screening Complete Complete Complete  Medication Review Oceanographer) Complete Complete Complete  PCP or Specialist appointment within 3-5 days of discharge Complete Complete Complete  HRI or Home Care Consult Not Complete  Not Complete  HRI or Home Care Consult Pt Refusal Comments NA  NA  SW Recovery Care/Counseling Consult Not Complete Complete Complete  SW Consult Not Complete Comments NA    Palliative Care Screening Not Applicable Not Applicable Not Applicable  Skilled Nursing Facility Not Applicable Not Applicable Not Applicable

## 2022-06-10 NOTE — Assessment & Plan Note (Addendum)
Secondary to diabetic ketoacidosis.  This has improved.

## 2022-06-10 NOTE — Assessment & Plan Note (Addendum)
Initially required stress dose steroids.   Transitioned to PO Cortef on 1/24. On Cortef 20 mg daily at 0800, 10 mg at 1500 Recommend endocrinology follow up outpatient. Pt requests referral to [facility] due to transportation issues getting to [facility].

## 2022-06-10 NOTE — Assessment & Plan Note (Addendum)
Acute kidney injury on CKD likely stage II.  Creatinine 2.58 on presentation and briefly normalized.  Cr today is 1.37, up slightly from 1.29. Mild recurrent AKI after Lasix given 2/9 Monitor BMP
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
## 2022-06-10 NOTE — Evaluation (Signed)
Occupational Therapy Evaluation Patient Details Name: Ashley Camacho MRN: [id number] DOB: Jan 13, 1991 Today's Date: 06/10/2022   History of Present Illness Ashley Camacho is a 32yoF who [facility] to [facility]22 c confusion and weakness. PMH: DM1, DKA, CKD, HTN, gastroparesis, adrenal insufficiency. In ED, BP 68/61mmHg. Per chart pt has been hypersomnolent, anorexic.   Clinical Impression   Patient presenting with decreased Ind in self care,balance, functional mobility/transfers, endurance, and safety awareness. Patient lives at home with 32 y/o and 7 y/o daughter and children's father. When asked if he worked during the day she reports, " I am not sure what he does anymore". Pt endorses taking baths in bed with baby wipes and it being " All I can do to get to the bathroom during the day . I consider crawling ". Pt reports she keeps her 54 y/o daughter in the bed with her and she tried to care for her the best that she can. Pt reports children's father cares for children when she is in the hospital and makes sure food is in the house. Per chart, pt has lost 39% of her weight in the last year. Her hair is matted during this session. She attempted EOB with PT prior to therapist arrival and was actively vomiting. OT supplying pt with toothbrush and toothpaste with pt needing assistance for set up as she has difficulty opening any packages during session secondary to weakness. She endorses wanting to get stronger in order to care for herself and her children. Plan to attempt further OOB activity next session.  Patient will benefit from acute OT to increase overall independence in the areas of ADLs, functional mobility, and safety awareness in order to safely discharge to next venue of care.      Recommendations for follow up therapy are one component of a multi-disciplinary discharge planning process, led by the attending physician.  Recommendations may be updated based on patient status, additional functional  criteria and insurance authorization.   Follow Up Recommendations  Acute inpatient rehab (3hours/day)     Assistance Recommended at Discharge Intermittent Supervision/Assistance  Patient can return home with the following A lot of help with walking and/or transfers;A lot of help with bathing/dressing/bathroom;Assistance with cooking/housework;Assist for transportation    Functional Status Assessment  Patient has had a recent decline in their functional status and demonstrates the ability to make significant improvements in function in a reasonable and predictable amount of time.  Equipment Recommendations  Other (comment) (defer to next venue of care)       Precautions / Restrictions Precautions Precautions: Fall Restrictions Weight Bearing Restrictions: No      Mobility       Transfers                   General transfer comment: unable secondary to nausea and recent active vomiting with attempts      Balance Overall balance assessment: History of Falls                                         ADL either performed or assessed with clinical judgement   ADL Overall ADL's : Needs assistance/impaired                                       General ADL Comments:  Pt is grossly weak during session as she is barely able to open perforated tissue box and needs set up A to open packaging for tooth brush, apply toothpaste. She was able to brush teeth with increased time.     Vision Patient Visual Report: No change from baseline              Pertinent Vitals/Pain Pain Assessment Pain Assessment: No/denies pain     Hand Dominance Left   Extremity/Trunk Assessment Upper Extremity Assessment Upper Extremity Assessment: Generalized weakness   Lower Extremity Assessment Lower Extremity Assessment: Generalized weakness       Communication Communication Communication: No difficulties   Cognition Arousal/Alertness:  Awake/alert Behavior During Therapy: WFL for tasks assessed/performed Overall Cognitive Status: Within Functional Limits for tasks assessed                                                  Home Living Family/patient expects to be discharged to:: Private residence Living Arrangements: Children   Type of Home: House Home Access: Stairs to enter Secretary/administrator of Steps: 3 Entrance Stairs-Rails: Right Home Layout: Two level;Able to live on main level with bedroom/bathroom Alternate Level Stairs-Number of Steps: typically does not go upstairs   Bathroom Shower/Tub: Chief Strategy Officer: Standard     Home Equipment: None          Prior Functioning/Environment               Mobility Comments: pt has been having dizziness issues with syncope over past month or so, prior to that 32 years of intermittent dizziness ADLs Comments: Has been limited due to chronic dizziness, syncope at home, uses baby wipes to clean self,  no other assisting adults. 32 y/o in bed with her she tries to care for during the day        OT Problem List: Decreased strength;Decreased activity tolerance;Decreased safety awareness;Impaired balance (sitting and/or standing);Decreased knowledge of use of DME or AE      OT Treatment/Interventions: Self-care/ADL training;Therapeutic exercise;Therapeutic activities;DME and/or AE instruction;Patient/family education;Manual therapy;Balance training;Energy conservation    OT Goals(Current goals can be found in the care plan section) Acute Rehab OT Goals Patient Stated Goal: to get stronger so I can care for my children OT Goal Formulation: With patient Time For Goal Achievement: 06/24/22 Potential to Achieve Goals: Fair ADL Goals Pt Will Perform Grooming: with modified independence Pt Will Transfer to Toilet: with modified independence Pt Will Perform Toileting - Clothing Manipulation and hygiene: with modified  independence Pt Will Perform Tub/Shower Transfer: with modified independence  OT Frequency: Min 2X/week       AM-PAC OT "6 Clicks" Daily Activity     Outcome Measure Help from another person eating meals?: None Help from another person taking care of personal grooming?: A Little Help from another person toileting, which includes using toliet, bedpan, or urinal?: A Lot Help from another person bathing (including washing, rinsing, drying)?: A Lot Help from another person to put on and taking off regular upper body clothing?: A Lot Help from another person to put on and taking off regular lower body clothing?: A Lot 6 Click Score: 15   End of Session Nurse Communication: Mobility status  Activity Tolerance: Treatment limited secondary to medical complications (Comment) Patient left: in bed;with bed alarm set;with call bell/phone within reach  OT Visit Diagnosis: Unsteadiness on feet (R26.81);Repeated falls (R29.6);Muscle weakness (generalized) (M62.81)                Time: 3212-2482 OT Time Calculation (min): 18 min Charges:  OT General Charges $OT Visit: 1 Visit OT Evaluation $OT Eval Moderate Complexity: 1 Mod OT Treatments $Self Care/Home Management : 8-22 mins  Darleen Crocker, MS, OTR/L , CBIS ascom [phone]  06/10/22, 3:17 PM

## 2022-06-10 NOTE — Progress Notes (Addendum)
Initial Nutrition Assessment  DOCUMENTATION CODES:   Underweight, Severe malnutrition in context of chronic illness  INTERVENTION:   -48 hour calorie count per MD -Ensure Enlive po TID, each supplement provides 350 kcal and 20 grams of protein -MVI with minerals daily -Magic cup TID with meals, each supplement provides 290 kcal and 9 grams of protein  -Given pt's malnutrition and gastroparesis, suspect pt will be unable to meet nutritional needs orally. Consider initiation of enteral nutrition support:  Initiate Osmolite 1.2 @ 20 ml/hr and increase by 10 ml every 12 hours to goal rate of 60 ml/hr.   120 ml free water flush every 6 hours  Tube feeding regimen provides 1728 kcal (100% of needs), 80 grams of protein, and 1181 ml of H2O. Total free water: 1661 ml daily  -Monitor Mg, K, and Phos and replete as needed secondary to high refeeding risk   NUTRITION DIAGNOSIS:   Severe Malnutrition related to social / environmental circumstances as evidenced by moderate muscle depletion, severe muscle depletion, moderate fat depletion, severe fat depletion, percent weight loss.  GOAL:   Patient will meet greater than or equal to 90% of their needs  MONITOR:   PO intake, Supplement acceptance, Diet advancement  REASON FOR ASSESSMENT:   Consult Assessment of nutrition requirement/status, Calorie Count  ASSESSMENT:   Pt with PMH of type 1 DM, HTN, gastroparesis, anemia, and adrenal insufficiency admitted for evaluation of weakness and vomiting.  Pt admitted with shock, AMS, and DKA.   Reviewed I/O's: +398 ml x 24 hours   UOP: 480 ml x 24 hours  Spoke with pt at bedside. Pt familiar to this RD due to an admission approximately 2 years ago secondary to hyperemesis gravidarum (pt's daughter is now 32 year old). Pt able to provide history, but struggles to provide time frame for events, often stating "that has been going on for a long time". Pt estimates a general decline in health  since at least September 2023. Pt admits to weakness and fear of falling. Pt with poor social support at home; she explains that she lives with her two daughters (ages 1 and 32) and her daughter's father. She shares with this RD that her 32 year old daughter had been helping care for her and get her in and out of bed, however, this had not happened since daughter returned to school from winter break Per pt, she is unable to remember the last time she was out of bed or the last time she showered (noted significant matted on hair on the left side of her head). Pt shares that she has not support system- she describes her daughter's father as someone not sensitive to her needs and any other friends and family that she has relied on in the past are connected to her daughter's father.   Pt reports that her daughter's father does the grocery shopping for the household, but this usually consists of frozen meals. Pt shares that she has been eating one meal per day at best and often goes days without eating. Pt complains of early satiety. Noted that also with history of gastroparesis. Per pt, she has not been taking reglan PTA and discontinued this "a long time ago" as she did not feel like this medication has been helpful to her. She is unsure if her appetite/ oral intake has worsened with discontinuing the reglan. Pt shares it is hard for her to pinpoint whether her medical issues or her psychosocial issues are causing her symptoms.  Per pt, she has not been checking her blood sugar or taking her medications. She explains that her medicaid was recently discontinued, which has been impacting her access to care. Pt unsure when her medicaid got discontinued, however, shares that she "got a big bill in the mail the last time I was in the hospital" (per review of [place], last recorded hospital admission was on 02/27/2022 at [facility]   Per H&P, pt is history of post-partum depression and has not  been taking her medication for this. Per discussion with MD, plan for psych consult.   Per pt, her UBW is around 115#. She reports she initially lost weight after her pregnancy after developing CHF, but prior to pregnancy wt was steadily around 115-120#. Reviewed wt hx; pt has experienced a 39% wt loss over the past year, which is significant for time frame.   Case discussed with RN, MD, pharmacist, TOC, and diabetes coordinator and shared above concerns. DM coordinator plans to see today. Per TOC, likely plan to file APS report.   Given pt's state of malnutrition, diet history, and gastroparesis, highly concerned about pt's ability to meet nutritional needs orally. Recommending GI consult for further work-up and management of gastroparesis. May need to consider at least temporary enteral nutrition support. While pt's chronic illnesses of adrenal insuffiencey, type 1 DM, and gastroparesis are likely contributing to malnutrition, suspect pt's social circumstances are stronger component of malnutrition at this time.   Noted pt has been advanced to carb modified diet. RD to add supplements to help optimize oral intake. Pt with malnutrition and would benefit from nutrient dense supplement. One Ensure Enlive supplement provides 350 kcals, 20 grams protein, and 44-45 grams of carbohydrate vs one Glucerna shake supplement, which provides 220 kcals, 10 grams of protein, and 26 grams of carbohydrate. Given pt's hx of DM, RD will reassess adequacy of PO intake, CBGS, and adjust supplement regimen as appropriate at follow-up.    Medications reviewed and include solu-cortef, reglan, IV insulin drip, and dextrose 5% in lactated ringers infusion @ 75 ml/hr.    Lab Results  Component Value Date   HGBA1C 10.2 (H) 02/28/2022   PTA DM medications are 2 units insulin aspart TID and 4 units insulin glargine BID.   Labs reviewed: CBGS: 147-197 (inpatient orders for glycemic control are IV insulin drip and 4 units  insulin glargine-yfgn BID).    NUTRITION - FOCUSED PHYSICAL EXAM:  Flowsheet Row Most Recent Value  Orbital Region Moderate depletion  Upper Arm Region Severe depletion  Thoracic and Lumbar Region Severe depletion  Buccal Region Moderate depletion  Temple Region Moderate depletion  Clavicle Bone Region Severe depletion  Clavicle and Acromion Bone Region Severe depletion  Scapular Bone Region Severe depletion  Dorsal Hand Moderate depletion  Patellar Region Moderate depletion  Anterior Thigh Region Moderate depletion  Posterior Calf Region Moderate depletion  Edema (RD Assessment) None  Hair Reviewed  Eyes Reviewed  Mouth Reviewed  Skin Reviewed  Nails Reviewed       Diet Order:   Diet Order             Diet NPO time specified  Diet effective now                   EDUCATION NEEDS:   Education needs have been addressed  Skin:  Skin Assessment: Reviewed RN Assessment  Last BM:  Unknown  Height:   Ht Readings from Last 1 Encounters:  06/10/22 5'  1" (1.549 m)    Weight:   Wt Readings from Last 1 Encounters:  06/10/22 36.9 kg    Ideal Body Weight:  47.7 kg  BMI:  Body mass index is 15.37 kg/m.  Estimated Nutritional Needs:   Kcal:  1650-1850  Protein:  80-95 grams  Fluid:  > 1.6 L    Loistine Chance, RD, LDN, [place] Registered Dietitian II Certified Diabetes Care and Education Specialist Please refer to [facility] for RD and/or RD on-call/weekend/after hours pager

## 2022-06-10 NOTE — Assessment & Plan Note (Addendum)
BMI 21.29 History of eating disorder as well. Now with J tube -was on nocturnal feeds, in an attempt to optimize nutrition status.   With N/V improved, she is eating adequately.  Discussed with nutritionist.  Will stop nocturnal tube feeds at this time. Pt may have some food security issues as home, reports she is able to eat much better here than at home.

## 2022-06-10 NOTE — Assessment & Plan Note (Addendum)
Improved with stress dose steroids, blood transfusion and fluids.  Tapered off stress dose steroids.  Resumed on home dose Cortef. Monitor BP.

## 2022-06-10 NOTE — Progress Notes (Signed)
Notified Randol Kern NP about elevated lactic level and missing beta hydroxy labs. No new orders at this time.

## 2022-06-11 ENCOUNTER — Inpatient Hospital Stay: Payer: Medicaid Other

## 2022-06-11 DIAGNOSIS — K3184 Gastroparesis: Secondary | ICD-10-CM | POA: Diagnosis not present

## 2022-06-11 DIAGNOSIS — D638 Anemia in other chronic diseases classified elsewhere: Secondary | ICD-10-CM | POA: Diagnosis not present

## 2022-06-11 DIAGNOSIS — D696 Thrombocytopenia, unspecified: Secondary | ICD-10-CM

## 2022-06-11 DIAGNOSIS — N179 Acute kidney failure, unspecified: Secondary | ICD-10-CM

## 2022-06-11 DIAGNOSIS — R7989 Other specified abnormal findings of blood chemistry: Secondary | ICD-10-CM

## 2022-06-11 DIAGNOSIS — E2740 Unspecified adrenocortical insufficiency: Secondary | ICD-10-CM | POA: Diagnosis not present

## 2022-06-11 DIAGNOSIS — E1010 Type 1 diabetes mellitus with ketoacidosis without coma: Secondary | ICD-10-CM

## 2022-06-11 DIAGNOSIS — R634 Abnormal weight loss: Secondary | ICD-10-CM | POA: Diagnosis not present

## 2022-06-11 DIAGNOSIS — E43 Unspecified severe protein-calorie malnutrition: Secondary | ICD-10-CM

## 2022-06-11 DIAGNOSIS — G9341 Metabolic encephalopathy: Secondary | ICD-10-CM | POA: Diagnosis not present

## 2022-06-11 DIAGNOSIS — E039 Hypothyroidism, unspecified: Secondary | ICD-10-CM

## 2022-06-11 DIAGNOSIS — I951 Orthostatic hypotension: Secondary | ICD-10-CM | POA: Diagnosis not present

## 2022-06-11 DIAGNOSIS — R636 Underweight: Secondary | ICD-10-CM

## 2022-06-11 DIAGNOSIS — N189 Chronic kidney disease, unspecified: Secondary | ICD-10-CM

## 2022-06-11 DIAGNOSIS — F331 Major depressive disorder, recurrent, moderate: Secondary | ICD-10-CM

## 2022-06-11 LAB — TYPE AND SCREEN
ABO/RH(D): A POS
Antibody Screen: NEGATIVE
Unit division: 0

## 2022-06-11 LAB — CBC
HCT: 23.3 % — ABNORMAL LOW (ref 36.0–46.0)
Hemoglobin: 8.3 g/dL — ABNORMAL LOW (ref 12.0–15.0)
MCH: 31.6 pg (ref 26.0–34.0)
MCHC: 35.6 g/dL (ref 30.0–36.0)
MCV: 88.6 fL (ref 80.0–100.0)
Platelets: 97 10*3/uL — ABNORMAL LOW (ref 150–400)
RBC: 2.63 MIL/uL — ABNORMAL LOW (ref 3.87–5.11)
RDW: 13.9 % (ref 11.5–15.5)
WBC: 7.7 10*3/uL (ref 4.0–10.5)
nRBC: 0 % (ref 0.0–0.2)

## 2022-06-11 LAB — GLUCOSE, CAPILLARY
Glucose-Capillary: 351 mg/dL — ABNORMAL HIGH (ref 70–99)
Glucose-Capillary: 365 mg/dL — ABNORMAL HIGH (ref 70–99)
Glucose-Capillary: 245 mg/dL — ABNORMAL HIGH (ref 70–99)
Glucose-Capillary: 231 mg/dL — ABNORMAL HIGH (ref 70–99)
Glucose-Capillary: 102 mg/dL — ABNORMAL HIGH (ref 70–99)

## 2022-06-11 LAB — BASIC METABOLIC PANEL
Anion gap: 12 (ref 5–15)
BUN: 33 mg/dL — ABNORMAL HIGH (ref 6–20)
CO2: 18 mmol/L — ABNORMAL LOW (ref 22–32)
Calcium: 8.5 mg/dL — ABNORMAL LOW (ref 8.9–10.3)
Chloride: 109 mmol/L (ref 98–111)
Creatinine, Ser: 1.52 mg/dL — ABNORMAL HIGH (ref 0.44–1.00)
GFR, Estimated: 47 mL/min — ABNORMAL LOW (ref >60)
Glucose, Bld: 281 mg/dL — ABNORMAL HIGH (ref 70–99)
Potassium: 3.9 mmol/L (ref 3.5–5.1)
Sodium: 139 mmol/L (ref 135–145)

## 2022-06-11 LAB — BPAM RBC
Blood Product Expiration Date: 202402142359
ISSUE DATE / TIME: 202401180201
Unit Type and Rh: 6200

## 2022-06-11 LAB — URINE CULTURE: Culture: NO GROWTH

## 2022-06-11 LAB — MAGNESIUM: Magnesium: 2.5 mg/dL — ABNORMAL HIGH (ref 1.7–2.4)

## 2022-06-11 LAB — PHOSPHORUS: Phosphorus: 3.6 mg/dL (ref 2.5–4.6)

## 2022-06-11 MED ORDER — INSULIN ASPART 100 UNIT/ML IJ SOLN
2.0000 [IU] | Freq: Three times a day (TID) | INTRAMUSCULAR | Status: DC
  Administered 2022-06-11 – 2022-06-12 (×4): 2 [IU] via SUBCUTANEOUS
  Filled 2022-06-11 (×4): qty 1

## 2022-06-11 MED ORDER — ERYTHROMYCIN ETHYLSUCCINATE 200 MG/5ML PO SUSR
50.0000 mg | Freq: Three times a day (TID) | ORAL | Status: DC
  Administered 2022-06-11 – 2022-06-17 (×14): 52 mg via ORAL
  Filled 2022-06-11 (×20): qty 1.3

## 2022-06-11 MED ORDER — INSULIN GLARGINE-YFGN 100 UNIT/ML ~~LOC~~ SOLN
4.0000 [IU] | Freq: Two times a day (BID) | SUBCUTANEOUS | Status: DC
  Administered 2022-06-11 – 2022-06-12 (×3): 4 [IU] via SUBCUTANEOUS
  Filled 2022-06-11 (×4): qty 0.04

## 2022-06-11 MED ORDER — SODIUM CHLORIDE 0.9 % IV SOLN: INTRAVENOUS | Status: DC

## 2022-06-11 MED ORDER — HYDROXYZINE HCL 10 MG PO TABS
10.0000 mg | ORAL_TABLET | Freq: Three times a day (TID) | ORAL | Status: DC | PRN
  Administered 2022-06-13 – 2022-06-26 (×5): 10 mg via ORAL
  Filled 2022-06-11 (×7): qty 1

## 2022-06-11 MED ORDER — LIDOCAINE VISCOUS HCL 2 % MT SOLN
15.0000 mL | Freq: Once | OROMUCOSAL | Status: DC
  Filled 2022-06-11: qty 15

## 2022-06-11 MED ORDER — PROMETHAZINE HCL 25 MG PO TABS
12.5000 mg | ORAL_TABLET | Freq: Four times a day (QID) | ORAL | Status: DC | PRN
  Administered 2022-06-11 – 2022-06-17 (×8): 12.5 mg via ORAL
  Filled 2022-06-11 (×10): qty 1

## 2022-06-11 MED ORDER — ALUM & MAG HYDROXIDE-SIMETH 200-200-20 MG/5ML PO SUSP
30.0000 mL | ORAL | Status: DC | PRN
  Administered 2022-06-11 – 2022-06-22 (×6): 30 mL via ORAL
  Filled 2022-06-11 (×6): qty 30

## 2022-06-11 MED ORDER — INSULIN GLARGINE-YFGN 100 UNIT/ML ~~LOC~~ SOLN: 7.0000 [IU] | Freq: Every day | SUBCUTANEOUS | Status: DC

## 2022-06-11 MED ORDER — LIDOCAINE VISCOUS HCL 2 % MT SOLN
OROMUCOSAL | Status: AC
  Administered 2022-06-11: 15 mL via OROMUCOSAL
  Filled 2022-06-11: qty 15

## 2022-06-11 MED ORDER — LORAZEPAM 2 MG/ML IJ SOLN
0.5000 mg | Freq: Once | INTRAMUSCULAR | Status: AC | PRN
  Administered 2022-06-11: 0.5 mg via INTRAVENOUS
  Filled 2022-06-11: qty 1

## 2022-06-11 NOTE — Progress Notes (Signed)
Patient quite emaciated. Patient states she has "not eaten anything in weeks." Patient stated the MD mentioned a feeding tube vs a peg tube. Patient reluctant to agree to either. Patient asked what would happen if she refused both and then after briefly pausing stated, "I guess I would just starve to death." Very flat affect with no emotion noted during conversation. Patient encouraged to attempt some oral intake. Patient agreeable to trying jello and toast. Order placed for patient.

## 2022-06-11 NOTE — Inpatient Diabetes Management (Signed)
Inpatient Diabetes Program Recommendations  AACE/ADA: New Consensus Statement on Inpatient Glycemic Control (2015)  Target Ranges:  Prepandial:   less than 140 mg/dL      Peak postprandial:   less than 180 mg/dL (1-2 hours)      Critically ill patients:  140 - 180 mg/dL   Lab Results  Component Value Date   GLUCAP 365 (H) 06/11/2022   HGBA1C 10.2 (H) 02/28/2022    Review of Glycemic Control  Latest Reference Range & Units 06/10/22 16:13 06/10/22 21:23 06/11/22 07:30 06/11/22 07:32  Glucose-Capillary 70 - 99 mg/dL 181 (H) 153 (H) 351 (H) 365 (H)  (H): Data is abnormally high  Diabetes history:  DM1/gastroparesis Outpatient Diabetes medications:  Lantus 6 units QD  Novolog SSI Current orders for Inpatient glycemic control: Semglee 4 units QD, Novolog 0-6 units TID and 0-5 units QHS, Solu-cortef 50 mg Q12H  Inpatient Diabetes Program Recommendations:    Semglee 10 units QD   Once eating she will need small dose of meal coverage  Will continue to follow while inpatient.  Thank you, Reche Dixon, MSN, [place] Diabetes Coordinator Inpatient Diabetes Program [phone] (team pager from 8a-5p)

## 2022-06-11 NOTE — Progress Notes (Signed)
Patient ate 1 1/2 jello cups for breakfast and was able to keep it down. RD made aware

## 2022-06-11 NOTE — TOC Progression Note (Signed)
Transition of Care [facility]) - Progression Note    Patient Details  Name: Ashley Camacho MRN: [id number] Date of Birth: 16-Feb-1991  [facility]) CM/SW Contact  Gerilyn Pilgrim, LCSW Phone Number: 06/11/2022, 12:51 PM  Clinical Narrative:   SW spoke with [place] in patient accounts who states a medicaid application has been completed as of today for the patient and reports it could be 30-90 days before a determination.     Expected Discharge Plan:  (TBD) Barriers to Discharge: Continued Medical Work up  Expected Discharge Plan and Services In-house Referral: Development worker, community, Nutrition Discharge Planning Services: CM Consult   Living arrangements for the past 2 months: Single Family Home                                       Social Determinants of Health (SDOH) Interventions SDOH Screenings   Food Insecurity: No Food Insecurity (06/10/2022)  Housing: Low Risk  (06/10/2022)  Transportation Needs: No Transportation Needs (06/10/2022)  Utilities: Not At Risk (06/10/2022)  Depression (PHQ2-9): Low Risk  (11/11/2020)  Tobacco Use: Low Risk  (02/28/2022)    Readmission Risk Interventions    11/03/2021   10:37 AM 06/16/2021   12:30 PM 06/03/2021    4:26 PM  Readmission Risk Prevention Plan  Transportation Screening Complete Complete Complete  Medication Review Press photographer) Complete Complete Complete  PCP or Specialist appointment within 3-5 days of discharge Complete Complete Complete  HRI or [place] Not Complete  Not Complete  HRI or Home Care Consult Pt Refusal Comments NA  NA  SW Recovery Care/Counseling Consult Not Complete Complete Complete  SW Consult Not Complete Comments NA    Palliative Care Screening Not Applicable Not Applicable Not [place] Not Applicable Not Applicable Not Applicable

## 2022-06-11 NOTE — Progress Notes (Signed)
Inpatient Rehab Admissions Coordinator:   Per OT recommendation,  patient was screened for CIR candidacy by Clemens Catholic, MS, CCC-SLP . At this time, Pt. Has been supervision to mod I with transfers and Min A- set up with ADLS. I do not think Pt. Requires the intensity of CIR. Additionally, I suspect once medical issues are managed and Pt.'s psychological  and nutrition needs are addressed, that she will progress to independence.  I will not place   order for rehab consult at ths time. Please contact me any with questions.  Clemens Catholic, [place], [place] Admissions Coordinator  [phone] ([place]) [phone] (office)

## 2022-06-11 NOTE — Progress Notes (Signed)
Progress Note   Patient: Ashley Camacho YBF:[id number] DOB: December 12, 1990 DOA: 06/09/2022     2 DOS: the patient was seen and examined on 06/11/2022   Brief hospital course: 32 year old female brought to the emergency room for weakness, vomiting and lethargy.  She was found to be in diabetic ketoacidosis.  She also has a history of depression and stopped taking her medication secondary to cost.  Patient has a history of adrenal insufficiency and diabetes.  1/18.  Patient interested in having a place to stay to help take care of her.  Case discussed with psychiatry to see the patient in consultation.  Tapering off insulin drip to long-acting insulin. 1/19.  Patient agreeable to Dobbhoff feeding tube.  IR placed but it still in the stomach at this point.  Will need an x-ray tomorrow to confirm that it is advanced before using.   Assessment and Plan: * DKA (diabetic ketoacidosis) ([place]) Type 1 diabetes with diabetic ketoacidosis.  Increase long-acting insulin to 40 units twice a day will also give sliding scale insulin.  Advancing diet.  Hypotension Improved with IV fluids.  Patient also given IV steroids.  Check orthostatic vital signs.  Adrenal insufficiency (H[facility]) Continue Solu-Cortef today.  Acute metabolic encephalopathy Secondary to diabetic ketoacidosis.  This has improved.  Hypothyroidism TSH slightly high.  Restart levothyroxine at a slightly higher dose.  Thrombocytopenia ([place]) Looking back at labs this seems chronic in nature.  Underweight BMI 15.37.  Hypomagnesemia Replaced  Pseudohyponatremia Sodium initially low secondary to diabetic ketoacidosis.  Last sodium in the normal range.  Protein-calorie malnutrition, severe Appreciate dietitian consultation.  Dobbhoff tube placed today but not far enough to start tube feedings.  Another x-ray will be ordered for tomorrow to see if it is advanced.  Anemia of chronic disease Received 1 unit of packed red blood cells for  hemoglobin of 7.0.  Ferritin elevated at 1511.  Last hemoglobin 8.3  Depression Appreciate psychiatric consultation.  Prozac started.  Acute kidney injury superimposed on chronic kidney disease ([place]) Acute kidney injury on CKD likely stage II.  Creatinine 2.58 on presentation and down to 1.52.        Subjective: Patient did not eat much lunch or breakfast.  Agreeable to Dobbhoff tube short-term.  May end up needing a surgeon to place a feeding tube.  Physical Exam: Vitals:   06/11/22 0541 06/11/22 0824 06/11/22 1157 06/11/22 1157  BP: (!) 140/88 (!) 150/79 128/81 128/81  Pulse: 89 88 87 88  Resp: 19 16 15 15   Temp: 99.3 F (37.4 C) 99 F (37.2 C) 97.8 F (36.6 C) 97.8 F (36.6 C)  TempSrc: Oral     SpO2: 100% 100% 100% 100%  Weight:      Height:       Physical Exam HENT:     Head: Normocephalic.     Mouth/Throat:     Pharynx: No oropharyngeal exudate.  Eyes:     General: Lids are normal.     Conjunctiva/sclera: Conjunctivae normal.  Cardiovascular:     Rate and Rhythm: Normal rate and regular rhythm.     Heart sounds: Normal heart sounds, S1 normal and S2 normal.  Pulmonary:     Breath sounds: No decreased breath sounds, wheezing, rhonchi or rales.  Abdominal:     Palpations: Abdomen is soft.     Tenderness: There is no abdominal tenderness.  Musculoskeletal:     Right lower leg: No swelling.     Left lower leg: No swelling.  Skin:    General: Skin is warm.     Findings: No rash.  Neurological:     Mental Status: She is alert and oriented to person, place, and time.     Data Reviewed: Creatinine 1.52, hemoglobin 8.3, platelet count 97   Disposition: Status is: Inpatient Remains inpatient appropriate because: Dobbhoff tube placed but did not get through the stomach yet we will repeat another x-ray tomorrow to confirm placement.  May end up needing a jejunostomy tube.  Planned Discharge Destination: To be determined    Time spent: 28  minutes  Author: Loletha Grayer, MD 06/11/2022 4:25 PM  For on call review [email or web address].

## 2022-06-11 NOTE — Progress Notes (Signed)
APS at bedside 

## 2022-06-11 NOTE — Progress Notes (Signed)
Patient to IR via bed in stable condition

## 2022-06-11 NOTE — Progress Notes (Signed)
Patient pre-medicated for dobhoff placement in IR per IR team request

## 2022-06-11 NOTE — Progress Notes (Signed)
Occupational Therapy Treatment Patient Details Name: Ashley Camacho MRN: [id number] DOB: Nov 27, 1990 Today's Date: 06/11/2022   History of present illness Ashley Camacho is a 39yoF who comes [facility] on [facility]09[facility]22 c confusion and weakness. PMH: DM1, DKA, CKD, HTN, gastroparesis, adrenal insufficiency. In ED, BP 68/68mmHg. Per chart pt has been hypersomnolent, anorexic.   OT comments  Upon entering the room, pt supine in bed with no c/o pain and greeting therapist. Pt still reports nausea with mobility this session but willing to participate. Shower offered but pt believed it to be "too much" this session. Pt is agreeable to basin bath. Sit <>stand with supervision and min guard for 10' to recliner chair. Pt with LOB while drying to don B shoes this session requiring min A to correct. Pt seated for bathing to conserve energy with set up A for UB self care and min  guard- min A for LB self care in standing. Pt returns to seated position in recliner chair at end of session with call bell and all needed items within reach.    Recommendations for follow up therapy are one component of a multi-disciplinary discharge planning process, led by the attending physician.  Recommendations may be updated based on patient status, additional functional criteria and insurance authorization.    Follow Up Recommendations  Acute inpatient rehab (3hours/day)     Assistance Recommended at Discharge Intermittent Supervision/Assistance  Patient can return home with the following  Assistance with cooking/housework;Assist for transportation;A little help with walking and/or transfers;A little help with bathing/dressing/bathroom;Help with stairs or ramp for entrance   Equipment Recommendations  Other (comment) (defer to next venue of care)       Precautions / Restrictions Precautions Precautions: Fall       Mobility Bed Mobility Overal bed mobility: Needs Assistance Bed Mobility: Supine to Sit, Sit to Supine      Supine to sit: Supervision Sit to supine: Supervision        Transfers Overall transfer level: Needs assistance Equipment used: 1 person hand held assist Transfers: Sit to/from Stand, Bed to chair/wheelchair/BSC Sit to Stand: Supervision                 Balance Overall balance assessment: History of Falls                                         ADL either performed or assessed with clinical judgement   ADL Overall ADL's : Needs assistance/impaired         Upper Body Bathing: Set up;Supervision/ safety;Sitting   Lower Body Bathing: Minimal assistance;Sit to/from stand   Upper Body Dressing : Set up;Minimal assistance;Sitting Upper Body Dressing Details (indicate cue type and reason): to don hospital gown Lower Body Dressing: Minimal assistance;Sit to/from stand                      Extremity/Trunk Assessment Upper Extremity Assessment Upper Extremity Assessment: Generalized weakness   Lower Extremity Assessment Lower Extremity Assessment: Generalized weakness        Vision Patient Visual Report: No change from baseline            Cognition Arousal/Alertness: Awake/alert Behavior During Therapy: WFL for tasks assessed/performed Overall Cognitive Status: Within Functional Limits for tasks assessed  Pertinent Vitals/ Pain       Pain Assessment Pain Assessment: No/denies pain         Frequency  Min 3X/week        Progress Toward Goals  OT Goals(current goals can now be found in the care plan section)  Progress towards OT goals: Progressing toward goals  Acute Rehab OT Goals Patient Stated Goal: to get stronger OT Goal Formulation: With patient Time For Goal Achievement: 06/24/22 Potential to Achieve Goals: [place] Discharge plan remains appropriate;Frequency needs to be updated       AM-PAC OT "6 Clicks" Daily Activity     Outcome  Measure   Help from another person eating meals?: None Help from another person taking care of personal grooming?: A Little Help from another person toileting, which includes using toliet, bedpan, or urinal?: A Little Help from another person bathing (including washing, rinsing, drying)?: A Little Help from another person to put on and taking off regular upper body clothing?: A Little Help from another person to put on and taking off regular lower body clothing?: A Little 6 Click Score: 19    End of Session    OT Visit Diagnosis: Unsteadiness on feet (R26.81);Repeated falls (R29.6);Muscle weakness (generalized) (M62.81)   Activity Tolerance Patient tolerated treatment well;Patient limited by fatigue   Patient Left in chair;with call bell/phone within reach   Nurse Communication Mobility status;Other (comment) (notified pt not on chair alarm at this time)        Time: 3557-3220 OT Time Calculation (min): 30 min  Charges: OT General Charges $OT Visit: 1 Visit OT Treatments $Self Care/Home Management : 23-37 mins  Darleen Crocker, MS, OTR/L , CBIS ascom [phone]  06/11/22, 12:16 PM

## 2022-06-11 NOTE — Progress Notes (Signed)
Attempt made with dobhoff placement at bedside per request of IR team. Unsuccessful. Patient unable to tolerate. IR team made aware.

## 2022-06-11 NOTE — Progress Notes (Signed)
Patient noted to have eaten a few bites of jello and drank 1 container of milk for lunch.

## 2022-06-11 NOTE — Progress Notes (Signed)
Received notification from Dr. Leslye Peer to refrain from using Dobhoff today, as patient will need another x-ray to confirm placement tomorrow.

## 2022-06-11 NOTE — Discharge Instructions (Signed)
[facility] in [place], [place]  Address: [address], [place], [place] [address] Hours: 5?PM ? Opens 8?AM Mon Phone: [phone]

## 2022-06-11 NOTE — Progress Notes (Signed)
Physical Therapy Treatment Patient Details Name: Ashley Camacho MRN: [id number] DOB: [birth year] Today's Date: 06/11/2022   History of Present Illness Ashley Camacho is a 81yoF who comes to [facility] on 06/09/22 c confusion and weakness. PMH: DM1, DKA, CKD, HTN, gastroparesis, adrenal insufficiency. In ED, BP 68/7mmHg. Per chart pt has been hypersomnolent, anorexic.    PT Comments    Pt seen for PT tx with pt agreeable. Pt is able to complete bed mobility with mod I, STS with CGA<>supervision, and ambulate in hallway without AD & CGA. Pt endorses dizziness with mobility but BP sitting on EOB in room 128/81 mmHg MAP 95. Pt does engage in 5x STS exercise with min assist overall. Pt reports fatigue throughout session & presents with flat affect throughout session. Recommend rehab in post acute setting to maximize independence & facilitate return to PLOF.    Recommendations for follow up therapy are one component of a multi-disciplinary discharge planning process, led by the attending physician.  Recommendations may be updated based on patient status, additional functional criteria and insurance authorization.  Follow Up Recommendations  Acute inpatient rehab (3hours/day)     Assistance Recommended at Discharge Intermittent Supervision/Assistance  Patient can return home with the following A little help with walking and/or transfers;Assistance with cooking/housework;Assistance with feeding;Help with stairs or ramp for entrance;Assist for transportation;Direct supervision/assist for medications management;Direct supervision/assist for financial management   Equipment Recommendations  None recommended by PT    Recommendations for Other Services       Precautions / Restrictions Precautions Precautions: Fall Restrictions Weight Bearing Restrictions: No     Mobility  Bed Mobility Overal bed mobility: Modified Independent Bed Mobility: Supine to Sit     Supine to sit: Modified independent  (Device/Increase time), HOB elevated          Transfers Overall transfer level: Needs assistance Equipment used: None Transfers: Sit to/from Stand Sit to Stand: Supervision, Min guard                Ambulation/Gait Ambulation/Gait assistance: Min guard Gait Distance (Feet): 80 Feet (+ 160 ft) Assistive device: None Gait Pattern/deviations: Decreased step length - right, Decreased step length - left, Decreased stride length Gait velocity: decreased     General Gait Details: standing rest breaks PRN   Stairs             Wheelchair Mobility    Modified Rankin (Stroke Patients Only)       Balance Overall balance assessment: Needs assistance Sitting-balance support: Feet supported Sitting balance-Leahy Scale: Good     Standing balance support: During functional activity, No upper extremity supported Standing balance-Leahy Scale: Fair                              Cognition Arousal/Alertness: Awake/alert Behavior During Therapy: Flat affect Overall Cognitive Status: Within Functional Limits for tasks assessed                                          Exercises Other Exercises Other Exercises: Pt performed 5x STS From recliner without BUE support with min assist, pt leaning posteriorly on chair with BLE. Activity focuses on BLE strengthening & overall activity tolerance, pt fatigued after task.    General Comments        Pertinent Vitals/Pain Pain Assessment Pain Assessment: Faces Faces Pain Scale: No  hurt    Home Living                          Prior Function            PT Goals (current goals can now be found in the care plan section) Acute Rehab PT Goals Patient Stated Goal: stop passing out, fix dizziness issue, be done with emesis PT Goal Formulation: With patient Time For Goal Achievement: 06/24/22 Potential to Achieve Goals: Fair Progress towards PT goals: Progressing toward goals     Frequency    Min 2X/week      PT Plan Discharge plan needs to be updated    Co-evaluation              AM-PAC PT "6 Clicks" Mobility   Outcome Measure  Help needed turning from your back to your side while in a flat bed without using bedrails?: None Help needed moving from lying on your back to sitting on the side of a flat bed without using bedrails?: A Little Help needed moving to and from a bed to a chair (including a wheelchair)?: A Little Help needed standing up from a chair using your arms (e.g., wheelchair or bedside chair)?: A Little Help needed to walk in hospital room?: A Little Help needed climbing 3-5 steps with a railing? : A Little 6 Click Score: 19    End of Session   Activity Tolerance: Patient tolerated treatment well Patient left: in chair;with call bell/phone within reach Nurse Communication: Mobility status PT Visit Diagnosis: Difficulty in walking, not elsewhere classified (R26.2);Other abnormalities of gait and mobility (R26.89);Repeated falls (R29.6);Adult, failure to thrive (R62.7);Dizziness and giddiness (R42);Other symptoms and signs involving the nervous system (R29.898)     Time: 4270-6237 PT Time Calculation (min) (ACUTE ONLY): 13 min  Charges:  $Therapeutic Activity: 8-22 mins                     Lavone Nian, PT, DPT 06/11/22, 12:38 PM   Waunita Schooner 06/11/2022, 12:35 PM

## 2022-06-11 NOTE — Consult Note (Signed)
[facility] Gastroenterology Inpatient Progress Note  Subjective: Patient seen for weight loss with gastroparesis. She is feelings better today. She is more awake. Her main complaint is heartburn whenever she eats or drinks. Painful swallowing. No dysphagia. She is receiving IV Protonix. Slight nausea comes and goes. Less dry heaves- no vomiting since yesterday and reports she brought ups stomach acid. No coffee grounds or blood seen by patient.  She is not eating and plans to discuss Dobbhoff temporary tube feeding with her medical team.  Denies  any discrete abdominal pain.  Sometimes when she is vomited, she would feel epigastric discomfort that resolves quickly. She thinks she was treated with NGT feedings  while  pregnant in 2022.   Objective: Vital signs in last 24 hours: Temp:  [97.8 F (36.6 C)-99.7 F (37.6 C)] 97.8 F (36.6 C) (01/19 1157) Pulse Rate:  [77-94] 88 (01/19 1157) Resp:  [15-22] 15 (01/19 1157) BP: (98-150)/(63-88) 128/81 (01/19 1157) SpO2:  [97 %-100 %] 100 % (01/19 1157) Blood pressure 128/81, pulse 88, temperature 97.8 F (36.6 C), resp. rate 15, height 5\' 1"  (1.549 m), weight 36.9 kg, SpO2 100 %, not currently breastfeeding.    Intake/Output from previous day: 01/18 0701 - 01/19 0700 In: 1534.1 [I.V.:1484.1; IV Piggyback:50] Out: 625 [Urine:625]  Intake/Output this shift: Total I/O In: 180 [P.O.:180] Out: 300 [Urine:300]   Gen: NAD. Appears comfortable. She is more interactive and answering questions in more detail.   HEENT: /AT. NO oral candidiasis noted.   Chest: CTA, no wheezes.  CV: RR nl S1, S2. No gallops.  Abd: soft, nt, nd. BS+  Ext: no edema. Pulses 2+  Neuro: Alert and oriented. Judgement appears normal. Nonfocal.   Lab Results: Results for orders placed or performed during the hospital encounter of 06/09/22 (from the past 24 hour(s))  Glucose, capillary     Status: Abnormal   Collection Time: 06/10/22 12:23 PM  Result Value  Ref Range   Glucose-Capillary 147 (H) 70 - 99 mg/dL  Glucose, capillary     Status: Abnormal   Collection Time: 06/10/22  1:08 PM  Result Value Ref Range   Glucose-Capillary 149 (H) 70 - 99 mg/dL  Glucose, capillary     Status: Abnormal   Collection Time: 06/10/22  4:13 PM  Result Value Ref Range   Glucose-Capillary 181 (H) 70 - 99 mg/dL  Glucose, capillary     Status: Abnormal   Collection Time: 06/10/22  9:23 PM  Result Value Ref Range   Glucose-Capillary 153 (H) 70 - 99 mg/dL  Magnesium     Status: Abnormal   Collection Time: 06/11/22  3:36 AM  Result Value Ref Range   Magnesium 2.5 (H) 1.7 - 2.4 mg/dL  Phosphorus     Status: None   Collection Time: 06/11/22  3:36 AM  Result Value Ref Range   Phosphorus 3.6 2.5 - 4.6 mg/dL  Basic metabolic panel     Status: Abnormal   Collection Time: 06/11/22  3:36 AM  Result Value Ref Range   Sodium 139 135 - 145 mmol/L   Potassium 3.9 3.5 - 5.1 mmol/L   Chloride 109 98 - 111 mmol/L   CO2 18 (L) 22 - 32 mmol/L   Glucose, Bld 281 (H) 70 - 99 mg/dL   BUN 33 (H) 6 - 20 mg/dL   Creatinine, Ser 06/13/22 (H) 0.44 - 1.00 mg/dL   Calcium 8.5 (L) 8.9 - 10.3 mg/dL   GFR, Estimated 47 (L) >60 mL/min   Anion  gap 12 5 - 15  CBC     Status: Abnormal   Collection Time: 06/11/22  3:36 AM  Result Value Ref Range   WBC 7.7 4.0 - 10.5 K/uL   RBC 2.63 (L) 3.87 - 5.11 MIL/uL   Hemoglobin 8.3 (L) 12.0 - 15.0 g/dL   HCT 23.3 (L) 36.0 - 46.0 %   MCV 88.6 80.0 - 100.0 fL   MCH 31.6 26.0 - 34.0 pg   MCHC 35.6 30.0 - 36.0 g/dL   RDW 13.9 11.5 - 15.5 %   Platelets 97 (L) 150 - 400 K/uL   nRBC 0.0 0.0 - 0.2 %  Glucose, capillary     Status: Abnormal   Collection Time: 06/11/22  7:30 AM  Result Value Ref Range   Glucose-Capillary 351 (H) 70 - 99 mg/dL  Glucose, capillary     Status: Abnormal   Collection Time: 06/11/22  7:32 AM  Result Value Ref Range   Glucose-Capillary 365 (H) 70 - 99 mg/dL  Glucose, capillary     Status: Abnormal   Collection Time:  06/11/22 11:59 AM  Result Value Ref Range   Glucose-Capillary 245 (H) 70 - 99 mg/dL     Recent Labs    06/10/22 0456 06/10/22 0707 06/11/22 0336  WBC 4.3 7.3 7.7  HGB 5.5* 9.1* 8.3*  HCT 17.7* 24.8* 23.3*  PLT 68* 109* 97*   BMET Recent Labs    06/10/22 0152 06/10/22 0715 06/11/22 0336  NA 137 138 139  K 4.3 4.3 3.9  CL 104 106 109  CO2 23 24 18*  GLUCOSE 288* 198* 281*  BUN 53* 48* 33*  CREATININE 1.95* 1.71* 1.52*  CALCIUM 9.3 8.8* 8.5*   LFT Recent Labs    06/09/22 2030  PROT 5.6*  ALBUMIN 3.4*  AST 22  ALT 13  ALKPHOS 78  BILITOT 1.8*   PT/INR Recent Labs    06/09/22 1744  LABPROT 17.2*  INR 1.4*   Hepatitis Panel No results for input(s): "HEPBSAG", "HCVAB", "HEPAIGM", "HEPBIGM" in the last 72 hours. C-Diff No results for input(s): "CDIFFTOX" in the last 72 hours. No results for input(s): "CDIFFPCR" in the last 72 hours.   Studies/Results: CT Head Wo Contrast  Result Date: 06/09/2022 CLINICAL DATA:  Altered mental status and increasing weakness EXAM: CT HEAD WITHOUT CONTRAST TECHNIQUE: Contiguous axial images were obtained from the base of the skull through the vertex without intravenous contrast. RADIATION DOSE REDUCTION: This exam was performed according to the departmental dose-optimization program which includes automated exposure control, adjustment of the mA and/or kV according to patient size and/or use of iterative reconstruction technique. COMPARISON:  None Available. FINDINGS: Brain: No evidence of acute infarction, hemorrhage, hydrocephalus, extra-axial collection or mass lesion/mass effect. Vascular: No hyperdense vessel or unexpected calcification. Skull: Normal. Negative for fracture or focal lesion. Sinuses/Orbits: Air-fluid level within the left maxillary antrum Other: None. IMPRESSION: No acute intracranial abnormality noted. Air-fluid level in left maxillary antrum consistent with acute sinusitis. Electronically Signed   By: Inez Catalina  M.D.   On: 06/09/2022 19:33   DG Chest Port 1 View  Result Date: 06/09/2022 CLINICAL DATA:  Questionable sepsis - evaluate for abnormality EXAM: PORTABLE CHEST 1 VIEW COMPARISON:  Radiograph 02/27/2022 FINDINGS: Unchanged cardiomediastinal silhouette. There is no focal airspace consolidation. There is no pleural effusion or evidence of pneumothorax. No acute osseous abnormality. IMPRESSION: No evidence of acute cardiopulmonary disease. Electronically Signed   By: Maurine Simmering M.D.   On: 06/09/2022 18:33  Scheduled Inpatient Medications:    sodium chloride   Intravenous Once   erythromycin ethylsuccinate  52 mg Oral TID AC   feeding supplement  237 mL Oral TID BM   FLUoxetine  10 mg Oral Daily   hydrocortisone sod succinate (SOLU-CORTEF) inj  50 mg Intravenous Q12H   insulin aspart  0-5 Units Subcutaneous QHS   insulin aspart  0-6 Units Subcutaneous TID WC   insulin aspart  2 Units Subcutaneous TID WC   insulin glargine-yfgn  4 Units Subcutaneous BID   levothyroxine  50 mcg Oral Q0600   metoCLOPramide (REGLAN) injection  5 mg Intravenous TID AC   multivitamin with minerals  1 tablet Oral Daily   pantoprazole (PROTONIX) IV  40 mg Intravenous Q24H    Continuous Inpatient Infusions:    cefTRIAXone (ROCEPHIN)  IV 1 g (06/10/22 1724)    PRN Inpatient Medications:  alum & mag hydroxide-simeth, dextrose, hydrOXYzine, promethazine   Assessment:  # Patient has a longstanding history of recurrent severe protein calorie malnutrition, anorexia, hyperemesis, gastroparesis in the setting of type 1 diabetes.   # Heartburn likely secondary to severe gastroparesis- untreated. Hx of reflux esophagitis per prior EGD.   # Abnormal weight loss likely related to lack of mobility (has been staying in bed and lost a large amount of muscle mass), poor po intake.   # Anemia of chronic disease  # Ferritin remains elevated. Total bili trending down. In setting of HF and DM1 will check HFE.  HF/cardiomyopathy can also be post partum and nutritional as well. She is at risk for multiple nutritional deficiencies She has undergone 2 EGDs within the last 2 years for similar findings as well as her anemia which is also likely related to her nutritional status that she has no overt signs of GI bleeding.   Plan:   I do not think that she will benefit from upper or lower endoscopy at this point in time She has had multiple imaging studies as well which have proven to be negative from a GI standpoint.  Heartburn is likely from reflux and gastroparesis. No signs of oral candidiasis on exam. Continue with IV Protonix 40 mg Q 24 hour and oral PPI BID when taking po meds. No role for Carafate as may make constipation worse.   Noted significant gastroparesis and also has significant HFrEF at 30%.She is unable to keep up with oral intake.  In discussion, the patient is not keen on having an NG tube placed, but understands she needs additional nutritional support.  She has been considered for TPN in the past.  She is at high risk for refeeding syndrome.  I think the best way to improve her nutrition and her strength is to place a postpyloric feeding tube which would need to be placed surgically.  A G-tube and PEG tube would not be an option due to her severe gastroparesis.  Feeding tube would also allow her to administer her medications as well. She may benefit from mirtazapine odt to boost appetite and this may help with her depression as well.  Per patient and chart review, reglan has not worked in the past. Can consider trial of erythromycin instead for gastroparesis; dose can range from 40-250mg  every 8 hours. Recommend starting with 50mg  tid before meals. Limit to two week treatment course. If some improvement, can always consider uptitrating dose.   Recommend small frequent meals p.o. Recommend social services/work consult because the patient has lost her Medicaid which is contributed to her  poor p.o. intake and lack of medication both acquiring and taking    Anti emetics as per primary team- phenergan suppositories as needed if pt is not able to take po.  GI will sign off on this case and please re consult if we are  needed.  Thank you for allowing [place] to be involved in this patient's care.   Rowan Blase, ANP @[facility] @, 12:18 PM

## 2022-06-11 NOTE — Consult Note (Signed)
[facility] Consult   Reason for Consult:  Depression  Referring Physician:  Dr. Leslye Peer  Patient Identification: Ashley Camacho MRN:  [id number] Principal Diagnosis: DKA (diabetic ketoacidosis) ([place]) Diagnosis:  Principal Problem:   DKA (diabetic ketoacidosis) ([place]) Active Problems:   Acute kidney injury superimposed on chronic kidney disease ([place])   Depression   Hypothyroidism   Anemia of chronic disease   Thrombocytopenia (HCC)   Adrenal insufficiency ([place])   Protein-calorie malnutrition, severe   Hypotension   Acute metabolic encephalopathy   Pseudohyponatremia   Hypomagnesemia   Underweight  Time spent with patient: 20 minutes  Subjective: "I'm feeling tired."   Ashley Camacho is a 32 yo female patient who was brought into the ED by significant other with increased weakness and vomiting which has led to a decrease in her overall mood and energy levels. Patient is noncompliant with medication regimen for mental health and medical comorbidities.Psych consult was requested d/t patient verbalizing suicidal thoughts.  On evaluation, patient was lying in bed in the dark, but was awake. She reported feeling worried and anxious when asked about mood. Stated she "constantly worries" about her medical conditions and caring for her children. She denied currently having suicidal or homicidal thoughts, but reported a suicide attempt via pill overdose (med unknown) during the summer of 2023, after which she was hospitalized and said she did not tell staff she had attempted to overdose. In 2021, she began taking medications (does not remember which ones) for depression and anxiety symptoms, which she ultimately felt to be unhelpful. She denied using ETOH, cannabis, or other illicit substances. Denied auditory or visual hallucinations.   Patient was started on Prozac 10mg  daily; last dose given this morning. She is interested in therapy and resources will be placed in discharge  instructions. Offered referrals to nutritionist and eating disorder treatment, which were declined. Consult psych as needed, does not warrant admission to the psych inpatient unit.   HPI on 06/09/22:  Subjective: "I've been so sick".  Ashley Camacho is a 32 y.o. female patient who was brought into the ED by significant other with increased weakness and vomiting which has led to a decrease in her overall mood and energy levels. Patient is noncompliant with medication regimen for mental health and medical comorbidities.   On evaluation, patient reports she has been experiencing depressive symptoms, which have been exacerbated by her physical health issues. She reports feeling sad due to being "sick all the time". Her depressive symptoms have led to decrease in her appetite and an increase in her time spent in bed. Patient has not been taking any medications for depression, but she is open to starting a new medication to manage her symptoms. Will start her on Prozac 10 mg in hopes that this medication would not only address her depressive symptoms but also increase her energy levels.  Patient admits to experiencing occasional thoughts of not wanting to be alive due to her physical health, but she denies having a specific plan. She also denies any history of suicide attempts or self-harm. She denies experiencing homicidal ideations and auditory or visual hallucinations.    Patient has 2 children, a 54-year-old and 20 year old, living with her at home. She reports that her current energy levels and physical health issues have made it difficult for her to care for herself and her children. Despite her physical and mental health struggle, patient's sleep is reported as satisfactory. However, her appetite is poor, which she attributes to her ongoing  comorbidities. Patient expresses a desire to be discharged to a nursing facility to regain her strength.   During visit, patient was very soft-spoken, maintaining  good eye contact, and engaged easily in conversation. However she appeared very weak and exhibited a flat affect.  HPI: Per Dr. Allena Katz, Ashley Camacho is an 32 y.o. female brought to the emergency room for weakness vomiting lethargy.  Per report patient had not been eating and has been sleeping a lot. Patient has a history of postpartum depression and has stopped taking medications for her depression. On arrival patient was found to be hypotensive hypoglycemic and acidotic. Patient has a history of adrenal insufficiency and her presentation is still very suspicious for crisis.  Past Psychiatric History:  Depression (HCC) , anxiety  Risk to Self:  none Risk to Others:  none Prior Inpatient Therapy:  none Prior Outpatient Therapy:  none  Past Medical History:  Past Medical History:  Diagnosis Date   Adrenal insufficiency (HCC)    Anemia    Gastroparesis    Hypertension    Type 1 diabetes (HCC)     Past Surgical History:  Procedure Laterality Date   BIOPSY  01/14/2021   Procedure: BIOPSY;  Surgeon: Tressia Danas, MD;  Location: [facility] ENDOSCOPY;  Service: Gastroenterology;;   CESAREAN SECTION     CESAREAN SECTION WITH BILATERAL TUBAL LIGATION N/A 02/17/2021   Procedure: CESAREAN SECTION WITH BILATERAL TUBAL LIGATION;  Surgeon: [name], DO;  Location: [facility] LD ORS;  Service: Obstetrics;  Laterality: N/A;   ESOPHAGOGASTRODUODENOSCOPY (EGD) WITH PROPOFOL N/A 01/14/2021   Procedure: ESOPHAGOGASTRODUODENOSCOPY (EGD) WITH PROPOFOL;  Surgeon: Tressia Danas, MD;  Location: [facility] ENDOSCOPY;  Service: Gastroenterology;  Laterality: N/A;   MOUTH SURGERY     Family History:  Family History  Problem Relation Age of Onset   Diabetes type I Father    CAD Father    CAD Paternal Grandmother    CAD Paternal Grandfather    Breast cancer Mother    Ovarian cancer Neg Hx    Family Psychiatric  History: Chart reviewed. No pertinent past psychiatric history. Social History:  Social History    Substance and Sexual Activity  Alcohol Use No     Social History   Substance and Sexual Activity  Drug Use No    Social History   Socioeconomic History   Marital status: Single    Spouse name: Not on file   Number of children: Not on file   Years of education: Not on file   Highest education level: Not on file  Occupational History   Occupation: home maker  Tobacco Use   Smoking status: Never   Smokeless tobacco: Never  Vaping Use   Vaping Use: Never used  Substance and Sexual Activity   Alcohol use: No   Drug use: No   Sexual activity: Yes    Birth control/protection: None  Other Topics Concern   Not on file  Social History Narrative   Not on file   Social Determinants of Health   Financial Resource Strain: Not on file  Food Insecurity: No Food Insecurity (06/10/2022)   Hunger Vital Sign    Worried About Running Out of Food in the Last Year: Never true    Ran Out of Food in the Last Year: Never true  Transportation Needs: No Transportation Needs (06/10/2022)   PRAPARE - Administrator, Civil Service (Medical): No    Lack of Transportation (Non-Medical): No  Physical Activity: Not on file  Stress: Not on file  Social Connections: Not on file   Additional Social History:    Allergies:  No Known Allergies  Labs:  Results for orders placed or performed during the hospital encounter of 06/09/22 (from the past 48 hour(s))  CBG monitoring, ED     Status: Abnormal   Collection Time: 06/09/22  5:31 PM  Result Value Ref Range   Glucose-Capillary >600 (HH) 70 - 99 mg/dL    Comment: Glucose reference range applies only to samples taken after fasting for at least 8 hours.  Lactic acid, plasma     Status: Abnormal   Collection Time: 06/09/22  5:44 PM  Result Value Ref Range   Lactic Acid, Venous 3.4 (HH) 0.5 - 1.9 mmol/L    Comment: CRITICAL RESULT CALLED TO, READ BACK BY AND VERIFIED WITH HANNAH KLEIN @1822  ON 06/09/22 SKL Performed at [facility], [address] [place], [place]   Comprehensive metabolic panel     Status: Abnormal   Collection Time: 06/09/22  5:44 PM  Result Value Ref Range   Sodium 132 (L) 135 - 145 mmol/L    Comment: ELECTROLYTES REPEATED TO VERIFY SKL   Potassium 4.9 3.5 - 5.1 mmol/L   Chloride 95 (L) 98 - 111 mmol/L   CO2 10 (L) 22 - 32 mmol/L   Glucose, Bld 887 (HH) 70 - 99 mg/dL    Comment: CRITICAL RESULT CALLED TO, READ BACK BY AND VERIFIED WITH HANNAH KLEIN @1844  ON 06/09/22 SKL Glucose reference range applies only to samples taken after fasting for at least 8 hours.    BUN 63 (H) 6 - 20 mg/dL   Creatinine, Ser (H) 0.44 - 1.00 mg/dL   Calcium 9.2 8.9 - 06/11/22 mg/dL   Total Protein 6.9 6.5 - 8.1 g/dL   Albumin 4.4 3.5 - 5.0 g/dL   AST 23 15 - 41 U/L   ALT 15 0 - 44 U/L   Alkaline Phosphatase 101 38 - 126 U/L   Total Bilirubin 2.3 (H) 0.3 - 1.2 mg/dL   GFR, Estimated 25 (L) >60 mL/min    Comment: (NOTE) Calculated using the CKD-EPI Creatinine Equation (2021)    Anion gap 27 (H) 5 - 15    Comment: Performed at [facility], [address] [place], [address] [place]  CBC with Differential     Status: Abnormal   Collection Time: 06/09/22  5:44 PM  Result Value Ref Range   WBC 10.4 4.0 - 10.5 K/uL   RBC 2.71 (L) 3.87 - 5.11 MIL/uL   Hemoglobin 8.2 (L) 12.0 - 15.0 g/dL   HCT 56387 (L) 06/11/22 - 56.4 %   MCV 86.3 80.0 - 100.0 fL   MCH 30.3 26.0 - 34.0 pg   MCHC 35.0 30.0 - 36.0 g/dL   RDW 33.2 95.1 - 88.4 %   Platelets 129 (L) 150 - 400 K/uL   nRBC 0.0 0.0 - 0.2 %   Neutrophils Relative % 77 %   Neutro Abs 8.1 (H) 1.7 - 7.7 K/uL   Lymphocytes Relative 16 %   Lymphs Abs 1.6 0.7 - 4.0 K/uL   Monocytes Relative 5 %   Monocytes Absolute 0.5 0.1 - 1.0 K/uL   Eosinophils Relative 1 %   Eosinophils Absolute 0.1 0.0 - 0.5 K/uL   Basophils Relative 1 %   Basophils Absolute 0.1 0.0 - 0.1 K/uL   Immature Granulocytes 0 %   Abs Immature Granulocytes 0.02 0.00 - 0.07  K/uL    Comment: Performed at [facility], [address] [place], [place] [address]  Blood Culture (routine x 2)     Status: None (Preliminary result)   Collection Time: 06/09/22  5:44 PM   Specimen: BLOOD  Result Value Ref Range   Specimen Description BLOOD BLOOD RIGHT ARM    Special Requests      BOTTLES DRAWN AEROBIC AND ANAEROBIC Blood Culture results may not be optimal due to an inadequate volume of blood received in culture bottles   Culture      NO GROWTH 2 DAYS Performed at [facility], [address], [place], [place] [address]    Report Status PENDING   Lipase, blood     Status: None   Collection Time: 06/09/22  5:44 PM  Result Value Ref Range   Lipase 31 11 - 51 U/L    Comment: Performed at [facility], [address] [place], [place] [address]  Troponin I (High Sensitivity)     Status: None   Collection Time: 06/09/22  5:44 PM  Result Value Ref Range   Troponin I (High Sensitivity) 3 <18 ng/L    Comment: (NOTE) Elevated high sensitivity troponin I (hsTnI) values and significant  changes across serial measurements may suggest ACS but many other  chronic and acute conditions are known to elevate hsTnI results.  Refer to the Links section for chest pain algorithms and additional  guidance. Performed at [facility], [address] [place], [place] [address]   Procalcitonin - Baseline     Status: None   Collection Time: 06/09/22  5:44 PM  Result Value Ref Range   Procalcitonin 0.26 ng/mL    Comment:        Interpretation: PCT (Procalcitonin) <= 0.5 ng/mL: Systemic infection (sepsis) is not likely. Local bacterial infection is possible. (NOTE)       Sepsis PCT Algorithm           Lower Respiratory Tract                                      Infection PCT Algorithm    ----------------------------     ----------------------------         PCT < 0.25 ng/mL                PCT < 0.10 ng/mL          Strongly encourage              Strongly discourage   discontinuation of antibiotics    initiation of antibiotics    ----------------------------     -----------------------------       PCT 0.25 - 0.50 ng/mL            PCT 0.10 - 0.25 ng/mL               OR       >80% decrease in PCT            Discourage initiation of                                            antibiotics      Encourage discontinuation           of antibiotics    ----------------------------     -----------------------------  PCT >= 0.50 ng/mL              PCT 0.26 - 0.50 ng/mL               AND        <80% decrease in PCT             Encourage initiation of                                             antibiotics       Encourage continuation           of antibiotics    ----------------------------     -----------------------------        PCT >= 0.50 ng/mL                  PCT > 0.50 ng/mL               AND         increase in PCT                  Strongly encourage                                      initiation of antibiotics    Strongly encourage escalation           of antibiotics                                     -----------------------------                                           PCT <= 0.25 ng/mL                                                 OR                                        > 80% decrease in PCT                                      Discontinue / Do not initiate                                             antibiotics  Performed at [facility], [address] [place], [place] [address]   Acetaminophen level     Status: Abnormal   Collection Time: 06/09/22  5:44 PM  Result Value Ref Range   Acetaminophen (Tylenol), Serum <10 (L) 10 - 30 ug/mL    Comment: (NOTE) Therapeutic concentrations vary significantly. A range of 10-30 ug/mL  may be an  effective concentration for many patients. However, some  are best treated at concentrations outside of this range. Acetaminophen concentrations >150 ug/mL at  4 hours after ingestion  and >50 ug/mL at 12 hours after ingestion are often associated with  toxic reactions.  Performed at [facility], [address] [place], [place] [address]   Salicylate level     Status: Abnormal   Collection Time: 06/09/22  5:44 PM  Result Value Ref Range   Salicylate Lvl <7.0 (L) 7.0 - 30.0 mg/dL    Comment: Performed at [facility], [address] [place], [place] [address]  Iron and TIBC     Status: Abnormal   Collection Time: 06/09/22  5:44 PM  Result Value Ref Range   Iron 99 28 - 170 ug/dL   TIBC 562 (L) 563 - 893 ug/dL   Saturation Ratios 68 (H) 10.4 - 31.8 %   UIBC 47 ug/dL    Comment: Performed at [facility], [address] [place], [place] [address]  Ferritin (Iron Binding Protein)     Status: Abnormal   Collection Time: 06/09/22  5:44 PM  Result Value Ref Range   Ferritin 1,511 (H) 11 - 307 ng/mL    Comment: Performed at [facility], [address] [place], [place] [address]  TSH     Status: Abnormal   Collection Time: 06/09/22  5:44 PM  Result Value Ref Range   TSH 7.397 (H) 0.350 - 4.500 uIU/mL    Comment: Performed by a 3rd Generation assay with a functional sensitivity of <=0.01 uIU/mL. Performed at [facility], [address] [place], [place] [address]   Magnesium     Status: Abnormal   Collection Time: 06/09/22  5:44 PM  Result Value Ref Range   Magnesium 2.5 (H) 1.7 - 2.4 mg/dL    Comment: Performed at [facility], [address] [place], [place] [address]  Phosphorus     Status: Abnormal   Collection Time: 06/09/22  5:44 PM  Result Value Ref Range   Phosphorus 6.7 (H) 2.5 - 4.6 mg/dL    Comment: Performed at [facility], [address] [place], [place] [address]  hCG, quantitative, pregnancy     Status: None   Collection Time: 06/09/22  5:44 PM  Result Value Ref Range   hCG, Beta Chain, Quant, S 2 <5 mIU/mL    Comment:          GEST. AGE       CONC.  (mIU/mL)   <=1 WEEK        5 - 50     2 WEEKS       50 - 500     3 WEEKS       100 - 10,000     4 WEEKS     1,000 - 30,000     5 WEEKS     3,500 - 115,000   6-8 WEEKS     12,000 - 270,000    12 WEEKS     15,000 - 220,000        FEMALE AND NON-PREGNANT FEMALE:     LESS THAN 5 mIU/mL Performed at [facility], [address] [place], [place] [address]   Protime-INR     Status: Abnormal   Collection Time: 06/09/22  5:44 PM  Result Value Ref Range   Prothrombin Time 17.2 (H) 11.4 - 15.2 seconds   INR 1.4 (H) 0.8 - 1.2  Comment: (NOTE) INR goal varies based on device and disease states. Performed at [facility], [address] [place], [place] [address]   T4, free     Status: None   Collection Time: 06/09/22  5:44 PM  Result Value Ref Range   Free T4 0.63 0.61 - 1.12 ng/dL    Comment: (NOTE) Biotin ingestion may interfere with free T4 tests. If the results are inconsistent with the TSH level, previous test results, or the clinical presentation, then consider biotin interference. If needed, order repeat testing after stopping biotin. Performed at [facility], [address] [place], [place] [address]   Blood Culture (routine x 2)     Status: None (Preliminary result)   Collection Time: 06/09/22  5:48 PM   Specimen: BLOOD  Result Value Ref Range   Specimen Description BLOOD LEFT ANTECUBITAL    Special Requests      BOTTLES DRAWN AEROBIC AND ANAEROBIC Blood Culture adequate volume   Culture      NO GROWTH 2 DAYS Performed at [facility], [address] [place], [place] [address]    Report Status PENDING   Ammonia     Status: Abnormal   Collection Time: 06/09/22  5:54 PM  Result Value Ref Range   Ammonia 47 (H) 9 - 35 umol/L    Comment: Performed at [facility], [address] [place], [place] [address]  CBG monitoring, ED     Status: Abnormal   Collection Time: 06/09/22  7:49 PM  Result Value Ref Range    Glucose-Capillary >600 (HH) 70 - 99 mg/dL    Comment: Glucose reference range applies only to samples taken after fasting for at least 8 hours.  Beta-hydroxybutyric acid     Status: Abnormal   Collection Time: 06/09/22  8:28 PM  Result Value Ref Range   Beta-Hydroxybutyric Acid >8.00 (H) 0.05 - 0.27 mmol/L    Comment: RESULT CONFIRMED BY MANUAL DILUTION SKL Performed at [facility], [address] [place], [place] [address]   CK     Status: Abnormal   Collection Time: 06/09/22  8:28 PM  Result Value Ref Range   Total CK 20 (L) 38 - 234 U/L    Comment: Performed at [facility], [address] [place], [place] [address]  Resp panel by RT-PCR (RSV, Flu A&B, Covid) Anterior Nasal Swab     Status: None   Collection Time: 06/09/22  8:30 PM   Specimen: Anterior Nasal Swab  Result Value Ref Range   SARS Coronavirus 2 by RT PCR NEGATIVE NEGATIVE    Comment: (NOTE) SARS-CoV-2 target nucleic acids are NOT DETECTED.  The SARS-CoV-2 RNA is generally detectable in upper respiratory specimens during the acute phase of infection. The lowest concentration of SARS-CoV-2 viral copies this assay can detect is 138 copies/mL. A negative result does not preclude SARS-Cov-2 infection and should not be used as the sole basis for treatment or other patient management decisions. A negative result may occur with  improper specimen collection/handling, submission of specimen other than nasopharyngeal swab, presence of viral mutation(s) within the areas targeted by this assay, and inadequate number of viral copies(<138 copies/mL). A negative result must be combined with clinical observations, patient history, and epidemiological information. The expected result is Negative.  Fact Sheet for Patients:  BloggerCourse.comhttps://www.fda.gov/media/152166/download  Fact Sheet for Healthcare Providers:  SeriousBroker.ithttps://www.fda.gov/media/152162/download  This test is no t yet approved or cleared by the [place] FDA and  has been  authorized for detection and/or diagnosis of SARS-CoV-2 by FDA under an Emergency Use Authorization (EUA). This EUA will remain  in effect (meaning this test can be used) for the duration of the COVID-19 declaration under Section 564(b)(1) of the Act, 21 U.S.C.section 360bbb-3(b)(1), unless the authorization is terminated  or revoked sooner.       Influenza A by PCR NEGATIVE NEGATIVE   Influenza B by PCR NEGATIVE NEGATIVE    Comment: (NOTE) The Xpert Xpress SARS-CoV-2/FLU/RSV plus assay is intended as an aid in the diagnosis of influenza from Nasopharyngeal swab specimens and should not be used as a sole basis for treatment. Nasal washings and aspirates are unacceptable for Xpert Xpress SARS-CoV-2/FLU/RSV testing.  Fact Sheet for Patients: BloggerCourse.com  Fact Sheet for Healthcare Providers: SeriousBroker.it  This test is not yet approved or cleared by the [place] FDA and has been authorized for detection and/or diagnosis of SARS-CoV-2 by FDA under an Emergency Use Authorization (EUA). This EUA will remain in effect (meaning this test can be used) for the duration of the COVID-19 declaration under Section 564(b)(1) of the Act, 21 U.S.C. section 360bbb-3(b)(1), unless the authorization is terminated or revoked.     Resp Syncytial Virus by PCR NEGATIVE NEGATIVE    Comment: (NOTE) Fact Sheet for Patients: BloggerCourse.com  Fact Sheet for Healthcare Providers: SeriousBroker.it  This test is not yet approved or cleared by the [place] FDA and has been authorized for detection and/or diagnosis of SARS-CoV-2 by FDA under an Emergency Use Authorization (EUA). This EUA will remain in effect (meaning this test can be used) for the duration of the COVID-19 declaration under Section 564(b)(1) of the Act, 21 U.S.C. section 360bbb-3(b)(1), unless  the authorization is terminated or revoked.  Performed at [facility], [address] [place], [place] [address]   Lactic acid, plasma     Status: Abnormal   Collection Time: 06/09/22  8:30 PM  Result Value Ref Range   Lactic Acid, Venous 3.9 (HH) 0.5 - 1.9 mmol/L    Comment: CRITICAL VALUE NOTED. VALUE IS CONSISTENT WITH PREVIOUSLY REPORTED/CALLED VALUE SKL Performed at [facility], [address] [place], [place] [address]   Troponin I (High Sensitivity)     Status: None   Collection Time: 06/09/22  8:30 PM  Result Value Ref Range   Troponin I (High Sensitivity) 4 <18 ng/L    Comment: (NOTE) Elevated high sensitivity troponin I (hsTnI) values and significant  changes across serial measurements may suggest ACS but many other  chronic and acute conditions are known to elevate hsTnI results.  Refer to the "Links" section for chest pain algorithms and additional  guidance. Performed at [facility], [address] [place], [place] [address]   Comprehensive metabolic panel     Status: Abnormal   Collection Time: 06/09/22  8:30 PM  Result Value Ref Range   Sodium 134 (L) 135 - 145 mmol/L    Comment: ELECTROLYTES REPEATED TO VERIFY MU   Potassium 5.0 3.5 - 5.1 mmol/L   Chloride 100 98 - 111 mmol/L   CO2 13 (L) 22 - 32 mmol/L   Glucose, Bld 736 (HH) 70 - 99 mg/dL    Comment: CRITICAL RESULT CALLED TO, READ BACK BY AND VERIFIED WITH Dahlia Client KLEIN 2121 06/09/22 MU Glucose reference range applies only to samples taken after fasting for at least 8 hours.    BUN 60 (H) 6 - 20 mg/dL   Creatinine, Ser 8.88 (H) 0.44 - 1.00 mg/dL  Calcium 8.5 (L) 8.9 - 10.3 mg/dL   Total Protein 5.6 (L) 6.5 - 8.1 g/dL   Albumin 3.4 (L) 3.5 - 5.0 g/dL   AST 22 15 - 41 U/L   ALT 13 0 - 44 U/L   Alkaline Phosphatase 78 38 - 126 U/L   Total Bilirubin 1.8 (H) 0.3 - 1.2 mg/dL   GFR, Estimated 29 (L) >60 mL/min    Comment: (NOTE) Calculated using the CKD-EPI Creatinine  Equation (2021)    Anion gap 21 (H) 5 - 15    Comment: Performed at [facility], [address] [place], [place] [address]  Type and screen     Status: None (Preliminary result)   Collection Time: 06/09/22  8:30 PM  Result Value Ref Range   ABO/RH(D) A POS    Antibody Screen NEG    Sample Expiration 06/12/2022,2359    Unit Number [id number]    Blood Component Type RED CELLS,LR    Unit division 00    Status of Unit ISSUED    Transfusion Status OK TO TRANSFUSE    Crossmatch Result      Compatible Performed at [facility], [address] [place], [place] [address]   Ethanol     Status: None   Collection Time: 06/09/22  8:30 PM  Result Value Ref Range   Alcohol, Ethyl (B) <10 <10 mg/dL    Comment: (NOTE) Lowest detectable limit for serum alcohol is 10 mg/dL.  For medical purposes only. Performed at [facility], [address] [place], [place] [address]   CBG monitoring, ED     Status: Abnormal   Collection Time: 06/09/22  8:38 PM  Result Value Ref Range   Glucose-Capillary >600 (HH) 70 - 99 mg/dL    Comment: Glucose reference range applies only to samples taken after fasting for at least 8 hours.  Blood gas, arterial     Status: Abnormal   Collection Time: 06/09/22  9:05 PM  Result Value Ref Range   pH, Arterial 7.32 (L) 7.35 - 7.45   pCO2 arterial 28 (L) 32 - 48 mmHg   pO2, Arterial 142 (H) 83 - 108 mmHg   Bicarbonate 14.4 (L) 20.0 - 28.0 mmol/L   Acid-base deficit 10.2 (H) 0.0 - 2.0 mmol/L   O2 Saturation 99.8 %   Patient temperature 37.0    Collection site LEFT RADIAL    Allens test (pass/fail) PASS PASS    Comment: Performed at [facility], [address] [place], [place] [address]  CBG monitoring, ED     Status: Abnormal   Collection Time: 06/09/22  9:10 PM  Result Value Ref Range   Glucose-Capillary 584 (HH) 70 - 99 mg/dL    Comment: Glucose reference range applies only to samples taken after fasting for at least  8 hours.  CBG monitoring, ED     Status: Abnormal   Collection Time: 06/09/22  9:54 PM  Result Value Ref Range   Glucose-Capillary 515 (HH) 70 - 99 mg/dL    Comment: Glucose reference range applies only to samples taken after fasting for at least 8 hours.   Comment 1 Document in Chart   CBG monitoring, ED     Status: Abnormal   Collection Time: 06/09/22 10:32 PM  Result Value Ref Range   Glucose-Capillary 485 (H) 70 - 99 mg/dL    Comment: Glucose reference range applies only to samples taken after fasting for at least 8 hours.  Urinalysis, Routine w reflex  microscopic     Status: Abnormal   Collection Time: 06/09/22 10:59 PM  Result Value Ref Range   Color, Urine STRAW (A) YELLOW   APPearance CLEAR (A) CLEAR   Specific Gravity, Urine 1.016 1.005 - 1.030   pH 5.0 5.0 - 8.0   Glucose, UA >=500 (A) NEGATIVE mg/dL   Hgb urine dipstick SMALL (A) NEGATIVE   Bilirubin Urine NEGATIVE NEGATIVE   Ketones, ur 80 (A) NEGATIVE mg/dL   Protein, ur NEGATIVE NEGATIVE mg/dL   Nitrite NEGATIVE NEGATIVE   Leukocytes,Ua NEGATIVE NEGATIVE   RBC / HPF 0-5 0 - 5 RBC/hpf   WBC, UA 0-5 0 - 5 WBC/hpf   Bacteria, UA NONE SEEN NONE SEEN   Squamous Epithelial / HPF 0-5 0 - 5 /HPF   Mucus PRESENT     Comment: Performed at [facility], [address] [place], [place] [address]  Urine Culture     Status: None   Collection Time: 06/09/22 10:59 PM   Specimen: In/Out Cath Urine  Result Value Ref Range   Specimen Description      IN/OUT CATH URINE Performed at [facility], [address] [place], [place] [address]    Special Requests      NONE Performed at [facility], [address] [place], [place] [address]    Culture      NO GROWTH Performed at [facility], [place] [address], [place], [place] [address]    Report Status 06/11/2022 FINAL   Urine Drug Screen, Qualitative     Status: None   Collection Time: 06/09/22 10:59 PM  Result Value Ref Range   Tricyclic,  Ur Screen NONE DETECTED NONE DETECTED   Amphetamines, Ur Screen NONE DETECTED NONE DETECTED   MDMA (Ecstasy)Ur Screen NONE DETECTED NONE DETECTED   Cocaine Metabolite,Ur [place] NONE DETECTED NONE DETECTED   Opiate, Ur Screen NONE DETECTED NONE DETECTED   Phencyclidine (PCP) Ur S NONE DETECTED NONE DETECTED   Cannabinoid 50 Ng, Ur [place] NONE DETECTED NONE DETECTED   Barbiturates, Ur Screen NONE DETECTED NONE DETECTED   Benzodiazepine, Ur Scrn NONE DETECTED NONE DETECTED   Methadone Scn, Ur NONE DETECTED NONE DETECTED    Comment: (NOTE) Tricyclics + metabolites, urine    Cutoff 1000 ng/mL Amphetamines + metabolites, urine  Cutoff 1000 ng/mL MDMA (Ecstasy), urine              Cutoff 500 ng/mL Cocaine Metabolite, urine          Cutoff 300 ng/mL Opiate + metabolites, urine        Cutoff 300 ng/mL Phencyclidine (PCP), urine         Cutoff 25 ng/mL Cannabinoid, urine                 Cutoff 50 ng/mL Barbiturates + metabolites, urine  Cutoff 200 ng/mL Benzodiazepine, urine              Cutoff 200 ng/mL Methadone, urine                   Cutoff 300 ng/mL  The urine drug screen provides only a preliminary, unconfirmed analytical test result and should not be used for non-medical purposes. Clinical consideration and professional judgment should be applied to any positive drug screen result due to possible interfering substances. A more specific alternate chemical method must be used in order to obtain a confirmed analytical result. Gas chromatography / mass spectrometry (GC/MS) is the preferred confirm atory  method. Performed at [facility], [address] [place] [address] [place], [place] [address]   CBG monitoring, ED     Status: Abnormal   Collection Time: 06/09/22 11:05 PM  Result Value Ref Range   Glucose-Capillary 418 (H) 70 - 99 mg/dL    Comment: Glucose reference range applies only to samples taken after fasting for at least 8 hours.  Basic metabolic panel     Status: Abnormal   Collection  Time: 06/09/22 11:20 PM  Result Value Ref Range   Sodium 138 135 - 145 mmol/L   Potassium 4.3 3.5 - 5.1 mmol/L   Chloride 104 98 - 111 mmol/L   CO2 18 (L) 22 - 32 mmol/L   Glucose, Bld 442 (H) 70 - 99 mg/dL    Comment: Glucose reference range applies only to samples taken after fasting for at least 8 hours.   BUN 53 (H) 6 - 20 mg/dL   Creatinine, Ser 6.042.17 (H) 0.44 - 1.00 mg/dL   Calcium 9.0 8.9 - 54.010.3 mg/dL   GFR, Estimated 30 (L) >60 mL/min    Comment: (NOTE) Calculated using the CKD-EPI Creatinine Equation (2021)    Anion gap 16 (H) 5 - 15    Comment: Performed at [facility], [address] [place], [place] [address]  CBC     Status: Abnormal   Collection Time: 06/09/22 11:20 PM  Result Value Ref Range   WBC 9.1 4.0 - 10.5 K/uL   RBC 2.33 (L) 3.87 - 5.11 MIL/uL   Hemoglobin 7.0 (L) 12.0 - 15.0 g/dL   HCT 14.719.1 (L) 82.936.0 - 56.246.0 %   MCV 82.0 80.0 - 100.0 fL   MCH 30.0 26.0 - 34.0 pg   MCHC 36.6 (H) 30.0 - 36.0 g/dL   RDW 13.012.4 86.511.5 - 78.415.5 %   Platelets 118 (L) 150 - 400 K/uL   nRBC 0.0 0.0 - 0.2 %    Comment: Performed at [facility], [address] [place], [place] [address]  CBG monitoring, ED     Status: Abnormal   Collection Time: 06/09/22 11:40 PM  Result Value Ref Range   Glucose-Capillary 380 (H) 70 - 99 mg/dL    Comment: Glucose reference range applies only to samples taken after fasting for at least 8 hours.  CBG monitoring, ED     Status: Abnormal   Collection Time: 06/10/22  1:02 AM  Result Value Ref Range   Glucose-Capillary 294 (H) 70 - 99 mg/dL    Comment: Glucose reference range applies only to samples taken after fasting for at least 8 hours.  Prepare RBC (crossmatch)     Status: None   Collection Time: 06/10/22  1:25 AM  Result Value Ref Range   Order Confirmation      ORDER PROCESSED BY BLOOD BANK Performed at [facility], [address] [place], [place] [address]   Basic metabolic panel     Status: Abnormal    Collection Time: 06/10/22  1:52 AM  Result Value Ref Range   Sodium 137 135 - 145 mmol/L   Potassium 4.3 3.5 - 5.1 mmol/L   Chloride 104 98 - 111 mmol/L   CO2 23 22 - 32 mmol/L   Glucose, Bld 288 (H) 70 - 99 mg/dL    Comment: Glucose reference range applies only to samples taken after fasting for at least 8 hours.   BUN 53 (H) 6 - 20 mg/dL   Creatinine, Ser 3.241.95 (H) 0.44 - 1.00 mg/dL  Calcium 9.3 8.9 - 10.3 mg/dL   GFR, Estimated 35 (L) >60 mL/min    Comment: (NOTE) Calculated using the CKD-EPI Creatinine Equation (2021)    Anion gap 10 5 - 15    Comment: Performed at [facility], [address] [place], [place] [address]  CBG monitoring, ED     Status: Abnormal   Collection Time: 06/10/22  2:27 AM  Result Value Ref Range   Glucose-Capillary 224 (H) 70 - 99 mg/dL    Comment: Glucose reference range applies only to samples taken after fasting for at least 8 hours.  CBG monitoring, ED     Status: Abnormal   Collection Time: 06/10/22  3:33 AM  Result Value Ref Range   Glucose-Capillary 211 (H) 70 - 99 mg/dL    Comment: Glucose reference range applies only to samples taken after fasting for at least 8 hours.  CBG monitoring, ED     Status: Abnormal   Collection Time: 06/10/22  4:37 AM  Result Value Ref Range   Glucose-Capillary 193 (H) 70 - 99 mg/dL    Comment: Glucose reference range applies only to samples taken after fasting for at least 8 hours.  POC urine preg, ED     Status: None   Collection Time: 06/10/22  4:46 AM  Result Value Ref Range   Preg Test, Ur Negative Negative  Lactic acid, plasma     Status: Abnormal   Collection Time: 06/10/22  4:56 AM  Result Value Ref Range   Lactic Acid, Venous >9.0 (HH) 0.5 - 1.9 mmol/L    Comment: CRITICAL VALUE NOTED. VALUE IS CONSISTENT WITH PREVIOUSLY REPORTED/CALLED VALUE HNM Performed at [facility], [address] [place], [place] [address]   CBC with Differential/Platelet     Status: Abnormal    Collection Time: 06/10/22  4:56 AM  Result Value Ref Range   WBC 4.3 4.0 - 10.5 K/uL   RBC 1.77 (L) 3.87 - 5.11 MIL/uL   Hemoglobin 5.5 (L) 12.0 - 15.0 g/dL   HCT 74.2 (L) 59.5 - 63.8 %   MCV 100.0 80.0 - 100.0 fL    Comment: REPEATED TO VERIFY   MCH 31.1 26.0 - 34.0 pg   MCHC 31.1 30.0 - 36.0 g/dL   RDW 75.6 43.3 - 29.5 %   Platelets 68 (L) 150 - 400 K/uL    Comment: REPEATED TO VERIFY PLATELET COUNT CONFIRMED BY SMEAR    nRBC 0.0 0.0 - 0.2 %   Neutrophils Relative % 90 %   Neutro Abs 3.9 1.7 - 7.7 K/uL   Lymphocytes Relative 7 %   Lymphs Abs 0.3 (L) 0.7 - 4.0 K/uL   Monocytes Relative 2 %   Monocytes Absolute 0.1 0.1 - 1.0 K/uL   Eosinophils Relative 0 %   Eosinophils Absolute 0.0 0.0 - 0.5 K/uL   Basophils Relative 0 %   Basophils Absolute 0.0 0.0 - 0.1 K/uL   Immature Granulocytes 1 %   Abs Immature Granulocytes 0.02 0.00 - 0.07 K/uL    Comment: Performed at [facility], [address] [place], [place] [address]  Glucose, capillary     Status: Abnormal   Collection Time: 06/10/22  5:39 AM  Result Value Ref Range   Glucose-Capillary 179 (H) 70 - 99 mg/dL    Comment: Glucose reference range applies only to samples taken after fasting for at least 8 hours.  MRSA Next Gen by PCR, Nasal     Status: None   Collection  Time: 06/10/22  5:43 AM   Specimen: Nasal Mucosa; Nasal Swab  Result Value Ref Range   MRSA by PCR Next Gen NOT DETECTED NOT DETECTED    Comment: (NOTE) The GeneXpert MRSA Assay (FDA approved for NASAL specimens only), is one component of a comprehensive MRSA colonization surveillance program. It is not intended to diagnose MRSA infection nor to guide or monitor treatment for MRSA infections. Test performance is not FDA approved in patients less than 50 years old. Performed at [facility], [address] [place], [place] [address]   Glucose, capillary     Status: Abnormal   Collection Time: 06/10/22  6:40 AM  Result Value Ref Range    Glucose-Capillary 184 (H) 70 - 99 mg/dL    Comment: Glucose reference range applies only to samples taken after fasting for at least 8 hours.  CBC     Status: Abnormal   Collection Time: 06/10/22  7:07 AM  Result Value Ref Range   WBC 7.3 4.0 - 10.5 K/uL   RBC 2.91 (L) 3.87 - 5.11 MIL/uL   Hemoglobin 9.1 (L) 12.0 - 15.0 g/dL    Comment: REPEATED TO VERIFY   HCT 24.8 (L) 36.0 - 46.0 %   MCV 85.2 80.0 - 100.0 fL    Comment: REPEATED TO VERIFY   MCH 31.3 26.0 - 34.0 pg   MCHC 36.7 (H) 30.0 - 36.0 g/dL   RDW 19.1 47.8 - 29.5 %   Platelets 109 (L) 150 - 400 K/uL   nRBC 0.0 0.0 - 0.2 %    Comment: Performed at [facility], [address] [place], [place] [address]  Basic metabolic panel     Status: Abnormal   Collection Time: 06/10/22  7:15 AM  Result Value Ref Range   Sodium 138 135 - 145 mmol/L   Potassium 4.3 3.5 - 5.1 mmol/L   Chloride 106 98 - 111 mmol/L   CO2 24 22 - 32 mmol/L   Glucose, Bld 198 (H) 70 - 99 mg/dL    Comment: Glucose reference range applies only to samples taken after fasting for at least 8 hours.   BUN 48 (H) 6 - 20 mg/dL   Creatinine, Ser 8.65 (H) 0.44 - 1.00 mg/dL   Calcium 8.8 (L) 8.9 - 10.3 mg/dL   GFR, Estimated 41 (L) >60 mL/min    Comment: (NOTE) Calculated using the CKD-EPI Creatinine Equation (2021)    Anion gap 8 5 - 15    Comment: Performed at [facility], [address] [place], [place] [address]  Magnesium     Status: None   Collection Time: 06/10/22  7:15 AM  Result Value Ref Range   Magnesium 1.7 1.7 - 2.4 mg/dL    Comment: Performed at [facility], [address] [place], [place] [address]  Lactic acid, plasma     Status: None   Collection Time: 06/10/22  7:15 AM  Result Value Ref Range   Lactic Acid, Venous 1.2 0.5 - 1.9 mmol/L    Comment: Performed at [facility] [address] [place], [place] [address]  Glucose, capillary     Status: Abnormal   Collection Time: 06/10/22  8:06 AM   Result Value Ref Range   Glucose-Capillary 180 (H) 70 - 99 mg/dL    Comment: Glucose reference range applies only to samples taken after fasting for at least 8 hours.  Glucose, capillary     Status: Abnormal   Collection Time: 06/10/22  9:12 AM  Result Value Ref Range   Glucose-Capillary 193 (H) 70 - 99 mg/dL    Comment: Glucose reference range applies only to samples taken after fasting for at least 8 hours.  Glucose, capillary     Status: Abnormal   Collection Time: 06/10/22 10:04 AM  Result Value Ref Range   Glucose-Capillary 197 (H) 70 - 99 mg/dL    Comment: Glucose reference range applies only to samples taken after fasting for at least 8 hours.  Glucose, capillary     Status: Abnormal   Collection Time: 06/10/22 11:03 AM  Result Value Ref Range   Glucose-Capillary 158 (H) 70 - 99 mg/dL    Comment: Glucose reference range applies only to samples taken after fasting for at least 8 hours.  Lactic acid, plasma     Status: None   Collection Time: 06/10/22 11:37 AM  Result Value Ref Range   Lactic Acid, Venous 1.0 0.5 - 1.9 mmol/L    Comment: Performed at [facility], [address] [place], [place] [address]  Glucose, capillary     Status: Abnormal   Collection Time: 06/10/22 12:23 PM  Result Value Ref Range   Glucose-Capillary 147 (H) 70 - 99 mg/dL    Comment: Glucose reference range applies only to samples taken after fasting for at least 8 hours.  Glucose, capillary     Status: Abnormal   Collection Time: 06/10/22  1:08 PM  Result Value Ref Range   Glucose-Capillary 149 (H) 70 - 99 mg/dL    Comment: Glucose reference range applies only to samples taken after fasting for at least 8 hours.  Glucose, capillary     Status: Abnormal   Collection Time: 06/10/22  4:13 PM  Result Value Ref Range   Glucose-Capillary 181 (H) 70 - 99 mg/dL    Comment: Glucose reference range applies only to samples taken after fasting for at least 8 hours.  Glucose, capillary     Status:  Abnormal   Collection Time: 06/10/22  9:23 PM  Result Value Ref Range   Glucose-Capillary 153 (H) 70 - 99 mg/dL    Comment: Glucose reference range applies only to samples taken after fasting for at least 8 hours.  Magnesium     Status: Abnormal   Collection Time: 06/11/22  3:36 AM  Result Value Ref Range   Magnesium 2.5 (H) 1.7 - 2.4 mg/dL    Comment: Performed at [facility], [address] [place], [place] [address]  Phosphorus     Status: None   Collection Time: 06/11/22  3:36 AM  Result Value Ref Range   Phosphorus 3.6 2.5 - 4.6 mg/dL    Comment: Performed at [facility], [address] [place], [place] [address]  Basic metabolic panel     Status: Abnormal   Collection Time: 06/11/22  3:36 AM  Result Value Ref Range   Sodium 139 135 - 145 mmol/L   Potassium 3.9 3.5 - 5.1 mmol/L   Chloride 109 98 - 111 mmol/L   CO2 18 (L) 22 - 32 mmol/L   Glucose, Bld 281 (H) 70 - 99 mg/dL    Comment: Glucose reference range applies only to samples taken after fasting for at least 8 hours.   BUN 33 (H) 6 - 20 mg/dL   Creatinine, Ser 6.81 (H) 0.44 - 1.00 mg/dL   Calcium 8.5 (L) 8.9 - 10.3 mg/dL   GFR, Estimated 47 (L) >60 mL/min    Comment: (NOTE) Calculated using  the CKD-EPI Creatinine Equation (2021)    Anion gap 12 5 - 15    Comment: Performed at [facility], [address] [place], [place] [address]  CBC     Status: Abnormal   Collection Time: 06/11/22  3:36 AM  Result Value Ref Range   WBC 7.7 4.0 - 10.5 K/uL   RBC 2.63 (L) 3.87 - 5.11 MIL/uL   Hemoglobin 8.3 (L) 12.0 - 15.0 g/dL   HCT 91.4 (L) 78.2 - 95.6 %   MCV 88.6 80.0 - 100.0 fL   MCH 31.6 26.0 - 34.0 pg   MCHC 35.6 30.0 - 36.0 g/dL   RDW 21.3 08.6 - 57.8 %   Platelets 97 (L) 150 - 400 K/uL    Comment: Immature Platelet Fraction may be clinically indicated, consider ordering this additional test [id number] PLATELET COUNT CONFIRMED BY SMEAR    nRBC 0.0 0.0 - 0.2 %    Comment: Performed  at [facility], [address] [place], [place] [address]  Glucose, capillary     Status: Abnormal   Collection Time: 06/11/22  7:30 AM  Result Value Ref Range   Glucose-Capillary 351 (H) 70 - 99 mg/dL    Comment: Glucose reference range applies only to samples taken after fasting for at least 8 hours.  Glucose, capillary     Status: Abnormal   Collection Time: 06/11/22  7:32 AM  Result Value Ref Range   Glucose-Capillary 365 (H) 70 - 99 mg/dL    Comment: Glucose reference range applies only to samples taken after fasting for at least 8 hours.    Current Facility-Administered Medications  Medication Dose Route Frequency Provider Last Rate Last Admin   0.9 %  sodium chloride infusion (Manually program via Guardrails IV Fluids)   Intravenous Once Gertha Calkin, MD       alum & mag hydroxide-simeth (MAALOX/MYLANTA) 200-200-20 MG/5ML suspension 30 mL  30 mL Oral Q4H PRN Alford Highland, MD   30 mL at 06/11/22 0432   cefTRIAXone (ROCEPHIN) 1 g in sodium chloride 0.9 % 100 mL IVPB  1 g Intravenous Q24H Irena Cords V, MD 200 mL/hr at 06/10/22 1724 1 g at 06/10/22 1724   dextrose 50 % solution 0-50 mL  0-50 mL Intravenous PRN Gertha Calkin, MD       erythromycin ethylsuccinate (EES) 200 MG/5ML suspension 52 mg  52 mg Oral TID AC Wieting, Richard, MD       feeding supplement (ENSURE ENLIVE / ENSURE PLUS) liquid 237 mL  237 mL Oral TID BM Wieting, Richard, MD   237 mL at 06/10/22 2000   FLUoxetine (PROZAC) capsule 10 mg  10 mg Oral Daily Bennett, Christal H, NP   10 mg at 06/11/22 0814   hydrocortisone sodium succinate (SOLU-CORTEF) 100 MG injection 50 mg  50 mg Intravenous Q12H Irena Cords V, MD   50 mg at 06/11/22 0813   insulin aspart (novoLOG) injection 0-5 Units  0-5 Units Subcutaneous QHS Wieting, Richard, MD       insulin aspart (novoLOG) injection 0-6 Units  0-6 Units Subcutaneous TID WC Alford Highland, MD   5 Units at 06/11/22 0819   insulin aspart (novoLOG) injection 2 Units   2 Units Subcutaneous TID WC Wieting, Richard, MD       insulin glargine-yfgn [facility]) injection 4 Units  4 Units Subcutaneous BID Alford Highland, MD   4 Units at 06/11/22 0932   levothyroxine (SYNTHROID) tablet 50 mcg  50 mcg Oral  [id number] Alford Highland, MD   50 mcg at 06/11/22 0615   metoCLOPramide (REGLAN) injection 5 mg  5 mg Intravenous TID AC Wieting, Richard, MD   5 mg at 06/11/22 0813   multivitamin with minerals tablet 1 tablet  1 tablet Oral Daily Wieting, Richard, MD       pantoprazole (PROTONIX) injection 40 mg  40 mg Intravenous Q24H Alford Highland, MD   40 mg at 06/10/22 1147   promethazine (PHENERGAN) tablet 12.5 mg  12.5 mg Oral Q6H PRN Alford Highland, MD        Musculoskeletal: Strength & Muscle Tone: decreased Gait & Station:  Did not observe Patient leans: N/A  Psychiatric Specialty Exam: Physical Exam Vitals and nursing note reviewed.  Constitutional:      Appearance: She is ill-appearing.  HENT:     Head: Normocephalic.     Nose: Nose normal.  Pulmonary:     Effort: Pulmonary effort is normal.  Musculoskeletal:        General: Normal range of motion.     Cervical back: Normal range of motion.  Neurological:     Mental Status: She is alert.  Psychiatric:        Attention and Perception: Attention and perception normal.        Mood and Affect: Mood is anxious and depressed.        Speech: Speech normal.        Behavior: Behavior normal. Behavior is cooperative.        Thought Content: Thought content normal.        Cognition and Memory: Cognition and memory normal.        Judgment: Judgment normal.     Review of Systems  Constitutional:  Negative for malaise/fatigue.  HENT:  Negative for hearing loss.   Respiratory:  Negative for cough and shortness of breath.   Neurological:  Negative for tremors.  Psychiatric/Behavioral:  Positive for depression. Negative for suicidal ideas. The patient is nervous/anxious.     Blood pressure 128/81, pulse 88,  temperature 97.8 F (36.6 C), resp. rate 15, height 5\' 1"  (1.549 m), weight 36.9 kg, SpO2 100 %, not currently breastfeeding.Body mass index is 15.37 kg/m.  General Appearance: Fairly groomed, gaunt, appeared younger than stated age  Eye Contact:  Good  Speech:  Clear and Coherent  Volume:  Decreased  Mood:  Depressed and anxious  Affect:  Congruent  Thought Process:  Coherent and Linear  Orientation:  Full (Time, Place, and Person)  Thought Content:  WDL  Suicidal Thoughts:  No  Homicidal Thoughts:  No  Memory:  Good  Judgement:  Fair  Insight:  Fair  Psychomotor Activity:  Decreased  Concentration:  Concentration: Good  Recall:  Good  Fund of Knowledge:  Good  Language:  Good  Akathisia:  Negative  Handed:  Right  AIMS (if indicated):     Assets:  Communication Skills Housing  ADL's:  Intact  Cognition:  WNL  Sleep:         Physical Exam: Physical Exam Vitals and nursing note reviewed.  Constitutional:      Appearance: She is ill-appearing.  HENT:     Head: Normocephalic.     Nose: Nose normal.  Pulmonary:     Effort: Pulmonary effort is normal.  Musculoskeletal:        General: Normal range of motion.     Cervical back: Normal range of motion.  Neurological:     Mental Status: She is alert.  Psychiatric:        Attention and Perception: Attention and perception normal.        Mood and Affect: Mood is anxious and depressed.        Speech: Speech normal.        Behavior: Behavior normal. Behavior is cooperative.        Thought Content: Thought content normal.        Cognition and Memory: Cognition and memory normal.        Judgment: Judgment normal.    Review of Systems  Constitutional:  Negative for malaise/fatigue.  HENT:  Negative for hearing loss.   Respiratory:  Negative for cough and shortness of breath.   Neurological:  Negative for tremors.  Psychiatric/Behavioral:  Positive for depression. Negative for suicidal ideas. The patient is  nervous/anxious.    Blood pressure (!) 150/79, pulse 88, temperature 99 F (37.2 C), resp. rate 16, height 5\' 1"  (1.549 m), weight 36.9 kg, SpO2 100 %, not currently breastfeeding. Body mass index is 15.37 kg/m.  Treatment Plan Summary: Major depressive disorder, moderate: Continue Prozac 10 mg daily [facility] therapy outpatient resources provided in discharge instructions  Disposition: No evidence of imminent risk to self or others at present.   Patient does not meet criteria for psychiatric inpatient admission. Supportive therapy provided about ongoing stressors.  Nanine Means, NP 06/11/2022 9:58 AM

## 2022-06-11 NOTE — Progress Notes (Addendum)
Calorie Count Note  48 hour calorie count ordered.  Diet: carb modified Supplements: Ensure Enlive po TID, each supplement provides 350 kcal and 20 grams of protein; Magic cup TID with meals, each supplement provides 290 kcal and 9 grams of protein   Case discussed with RN, who reports pt remains with minimal intake. Pt has only consumed 1.5 cups of jello, container of milk, and water this shift. Pt ordered chicken broth for lunch. Pt has been refusing supplements, consumed a few sips of Ensure last night. Per RN, pt has refused feeding tube.   Per GI notes, recommending j-tube secondary to gastroparesis. Per discussion with RN, GI to talk with pt about j-tube placement.   Pt with severe malnutrition. Given 24 hour calorie count results, continue to be concerned about pt's ability to meet nutritional needs orally. RD will continue calorie count through the weekend in attempt to obtain more data, but continue to suspect pt will not need needs PO. May need to consider at least temporary enteral nutrition support. While pt's chronic illnesses of adrenal insuffiencey, type 1 DM, and gastroparesis are likely contributing to malnutrition, suspect pt's social circumstances are stronger component of malnutrition at this time.    Case discussed with TOC; plan to try to obtain placement for pt. APS report has been made.   Labs reviewed: K and Phos WDL. Mg: 2.5.  1/18-1/19 Breakfast: 105 kcals, 0 grams protein Lunch: 120 kcals, 8 grams protein Dinner: 0 kcals, 0 grams protein (0% documented) Supplements: 35 kcals, 0 grams proteins  Total intake: 225 kcal (14% of minimum estimated needs)  8 grams protein (10% of minimum estimated needs)  Nutrition Dx: Severe Malnutrition related to social / environmental circumstances as evidenced by moderate muscle depletion, severe muscle depletion, moderate fat depletion, severe fat depletion, percent weight loss; ongoing  Goal: Patient will meet greater than or  equal to 90% of their needs; unmet  Intervention:   -Continue 48 hour calorie count per MD; RD will follow-up with results on 06/14/22 -Ensure Enlive po TID, each supplement provides 350 kcal and 20 grams of protein -MVI with minerals daily -Magic cup TID with meals, each supplement provides 290 kcal and 9 grams of protein  -Given pt's malnutrition and gastroparesis, suspect pt will be unable to meet nutritional needs orally. Consider initiation of enteral nutrition support. May need to consider post-pyloric placement given history of gastroparesis:   Initiate Osmolite 1.2 @ 20 ml/hr and increase by 10 ml every 12 hours to goal rate of 60 ml/hr.    120 ml free water flush every 6 hours   Tube feeding regimen provides 1728 kcal (100% of needs), 80 grams of protein, and 1181 ml of H2O. Total free water: 1661 ml daily   -Monitor Mg, K, and Phos and replete as needed secondary to high refeeding risk   Loistine Chance, RD, LDN, [place] Registered Dietitian II Certified Diabetes Care and Education Specialist Please refer to [facility] for RD and/or RD on-call/weekend/after hours pager

## 2022-06-11 NOTE — Progress Notes (Signed)
Patient returned from IR in stable condition with dobhoff tube in place

## 2022-06-12 ENCOUNTER — Inpatient Hospital Stay: Payer: Medicaid Other

## 2022-06-12 DIAGNOSIS — I951 Orthostatic hypotension: Secondary | ICD-10-CM | POA: Diagnosis not present

## 2022-06-12 DIAGNOSIS — G9341 Metabolic encephalopathy: Secondary | ICD-10-CM | POA: Diagnosis not present

## 2022-06-12 DIAGNOSIS — K3184 Gastroparesis: Secondary | ICD-10-CM

## 2022-06-12 DIAGNOSIS — E43 Unspecified severe protein-calorie malnutrition: Secondary | ICD-10-CM

## 2022-06-12 DIAGNOSIS — E1010 Type 1 diabetes mellitus with ketoacidosis without coma: Secondary | ICD-10-CM | POA: Diagnosis not present

## 2022-06-12 DIAGNOSIS — E2740 Unspecified adrenocortical insufficiency: Secondary | ICD-10-CM | POA: Diagnosis not present

## 2022-06-12 DIAGNOSIS — R7989 Other specified abnormal findings of blood chemistry: Secondary | ICD-10-CM | POA: Diagnosis not present

## 2022-06-12 DIAGNOSIS — N179 Acute kidney failure, unspecified: Secondary | ICD-10-CM | POA: Diagnosis not present

## 2022-06-12 DIAGNOSIS — D696 Thrombocytopenia, unspecified: Secondary | ICD-10-CM | POA: Diagnosis not present

## 2022-06-12 DIAGNOSIS — D638 Anemia in other chronic diseases classified elsewhere: Secondary | ICD-10-CM | POA: Diagnosis not present

## 2022-06-12 DIAGNOSIS — E039 Hypothyroidism, unspecified: Secondary | ICD-10-CM | POA: Diagnosis not present

## 2022-06-12 LAB — BASIC METABOLIC PANEL
Anion gap: 8 (ref 5–15)
BUN: 31 mg/dL — ABNORMAL HIGH (ref 6–20)
CO2: 25 mmol/L (ref 22–32)
Calcium: 8.4 mg/dL — ABNORMAL LOW (ref 8.9–10.3)
Chloride: 111 mmol/L (ref 98–111)
Creatinine, Ser: 1.38 mg/dL — ABNORMAL HIGH (ref 0.44–1.00)
GFR, Estimated: 52 mL/min — ABNORMAL LOW (ref >60)
Glucose, Bld: 236 mg/dL — ABNORMAL HIGH (ref 70–99)
Potassium: 3.8 mmol/L (ref 3.5–5.1)
Sodium: 144 mmol/L (ref 135–145)

## 2022-06-12 LAB — CBC
HCT: 24.5 % — ABNORMAL LOW (ref 36.0–46.0)
Hemoglobin: 8.3 g/dL — ABNORMAL LOW (ref 12.0–15.0)
MCH: 30.5 pg (ref 26.0–34.0)
MCHC: 33.9 g/dL (ref 30.0–36.0)
MCV: 90.1 fL (ref 80.0–100.0)
Platelets: 88 10*3/uL — ABNORMAL LOW (ref 150–400)
RBC: 2.72 MIL/uL — ABNORMAL LOW (ref 3.87–5.11)
RDW: 13.6 % (ref 11.5–15.5)
WBC: 4.6 10*3/uL (ref 4.0–10.5)
nRBC: 0 % (ref 0.0–0.2)

## 2022-06-12 LAB — PHOSPHORUS: Phosphorus: 2.5 mg/dL (ref 2.5–4.6)

## 2022-06-12 LAB — GLUCOSE, CAPILLARY
Glucose-Capillary: 306 mg/dL — ABNORMAL HIGH (ref 70–99)
Glucose-Capillary: 279 mg/dL — ABNORMAL HIGH (ref 70–99)
Glucose-Capillary: 253 mg/dL — ABNORMAL HIGH (ref 70–99)
Glucose-Capillary: 282 mg/dL — ABNORMAL HIGH (ref 70–99)

## 2022-06-12 LAB — MAGNESIUM: Magnesium: 2.3 mg/dL (ref 1.7–2.4)

## 2022-06-12 MED ORDER — HYDROCORTISONE 10 MG PO TABS
10.0000 mg | ORAL_TABLET | Freq: Every day | ORAL | Status: AC
  Administered 2022-06-12: 10 mg via ORAL
  Filled 2022-06-12: qty 1

## 2022-06-12 MED ORDER — SODIUM CHLORIDE 0.9% IV SOLUTION: Freq: Once | INTRAVENOUS | Status: DC

## 2022-06-12 MED ORDER — INSULIN ASPART 100 UNIT/ML IJ SOLN
3.0000 [IU] | Freq: Three times a day (TID) | INTRAMUSCULAR | Status: DC
  Administered 2022-06-12: 3 [IU] via SUBCUTANEOUS
  Filled 2022-06-12: qty 1

## 2022-06-12 MED ORDER — SODIUM CHLORIDE 0.9 % IV SOLN
2.0000 g | Freq: Two times a day (BID) | INTRAVENOUS | Status: AC
  Administered 2022-06-13: 2 g via INTRAVENOUS
  Filled 2022-06-12: qty 2

## 2022-06-12 MED ORDER — HYDROCORTISONE 10 MG PO TABS
20.0000 mg | ORAL_TABLET | Freq: Every day | ORAL | Status: DC
  Filled 2022-06-12: qty 2

## 2022-06-12 MED ORDER — PANTOPRAZOLE SODIUM 40 MG PO TBEC
40.0000 mg | DELAYED_RELEASE_TABLET | Freq: Every day | ORAL | Status: DC
  Administered 2022-06-14: 40 mg via ORAL
  Filled 2022-06-12: qty 1

## 2022-06-12 MED ORDER — INSULIN GLARGINE-YFGN 100 UNIT/ML ~~LOC~~ SOLN
5.0000 [IU] | Freq: Two times a day (BID) | SUBCUTANEOUS | Status: DC
  Administered 2022-06-12: 5 [IU] via SUBCUTANEOUS
  Filled 2022-06-12 (×3): qty 0.05

## 2022-06-12 MED ORDER — CHLORHEXIDINE GLUCONATE CLOTH 2 % EX PADS
6.0000 | MEDICATED_PAD | Freq: Every day | CUTANEOUS | Status: DC
  Administered 2022-06-13 – 2022-06-14 (×2): 6 via TOPICAL

## 2022-06-12 NOTE — Progress Notes (Signed)
PHARMACIST - PHYSICIAN COMMUNICATION  CONCERNING: IV to Oral Route Change Policy  RECOMMENDATION: This patient is receiving pantoprazole by the intravenous route.  Based on criteria approved by the Pharmacy and Therapeutics Committee, the intravenous medication(s) is/are being converted to the equivalent oral dose form(s).   DESCRIPTION: These criteria include: The patient is eating (either orally or via tube) and/or has been taking other orally administered medications for a least 24 hours The patient has no evidence of active gastrointestinal bleeding or impaired GI absorption (gastrectomy, short bowel, patient on TNA or NPO).  If you have questions about this conversion, please contact the [place], [facility] 06/12/2022 2:03 PM

## 2022-06-12 NOTE — Inpatient Diabetes Management (Signed)
Inpatient Diabetes Program Recommendations  AACE/ADA: New Consensus Statement on Inpatient Glycemic Control (2015)  Target Ranges:  Prepandial:   less than 140 mg/dL      Peak postprandial:   less than 180 mg/dL (1-2 hours)      Critically ill patients:  140 - 180 mg/dL   Lab Results  Component Value Date   GLUCAP 279 (H) 06/12/2022   HGBA1C 10.2 (H) 02/28/2022    Review of Glycemic Control  Latest Reference Range & Units 06/11/22 07:32 06/11/22 11:59 06/11/22 17:40 06/11/22 21:15 06/12/22 08:29 06/12/22 12:10  Glucose-Capillary 70 - 99 mg/dL 365 (H) 245 (H) 231 (H) 102 (H) 306 (H) 279 (H)  (H): Data is abnormally high  Diabetes history:  DM1/gastroparesis Outpatient Diabetes medications:  Lantus 6 units QD Novolog SSI Current orders for Inpatient glycemic control: Semglee 4 units BID, Novolog 0-6 units TID and 0-5 units QHS, Novolog 2 units TID with meals, Solu-cortef 50 mg Q12H  Inpatient Diabetes Program Recommendations:    Semglee 5 units BID Novolog 3 units TID with meals/Supplements  Will continue to follow while inpatient.  Thank you, Reche Dixon, MSN, [place] Diabetes Coordinator Inpatient Diabetes Program [phone] (team pager from 8a-5p)

## 2022-06-12 NOTE — Consult Note (Signed)
Patient ID: Ashley Camacho, female   DOB: Sep 23, 1990, 32 y.o.   MRN: [id number]  HPI Ashley Camacho is a 32 y.o. female seen in consultation at the request of Dr.Wieting.  She does have a significant history of diabetes, severe gastroparesis.  Prior C-section. She came in through the ER with altered mental status, failure to thrive weakness and vomiting.  She was hypotensive and hypoglycemia as well as acidotic.  She has been appropriately hydrated and workup. Recently CT scan show evidence of anasarca and some gallstones. Current smallbore feeding tube still lays in the stomach.  He does have a history of chronic anemia and thrombocytopenia. Last platelet count is in the 88,000, hemoglobin of 8.3.  Did receive a unit of blood.  Hemoglobin went down to 5.5 And she denies any abdominal pain.  She does feel for.  She does feel weak.  HPI  Past Medical History:  Diagnosis Date   Adrenal insufficiency ([place])    Anemia    Gastroparesis    Hypertension    Type 1 diabetes ([place])     Past Surgical History:  Procedure Laterality Date   BIOPSY  01/14/2021   Procedure: BIOPSY;  Surgeon: Thornton Park, MD;  Location: [facility];  Service: Gastroenterology;;   CESAREAN SECTION     CESAREAN SECTION WITH BILATERAL TUBAL LIGATION N/A 02/17/2021   Procedure: CESAREAN SECTION WITH BILATERAL TUBAL LIGATION;  Surgeon: Truett Mainland, DO;  Location: [place] LD [facility]S;  Service: Obstetrics;  Laterality: N/A;   ESOPHAGOGASTRODUODENOSCOPY (EGD) WITH PROPOFOL N/A 01/14/2021   Procedure: ESOPHAGOGASTRODUODENOSCOPY (EGD) WITH PROPOFOL;  Surgeon: Thornton Park, MD;  Location: [facility];  Service: Gastroenterology;  Laterality: N/A;   MOUTH SURGERY      Family History  Problem Relation Age of Onset   Diabetes type I Father    CAD Father    CAD Paternal Grandmother    CAD Paternal Grandfather    Breast cancer Mother    Ovarian cancer Neg Hx     Social History Social History   Tobacco Use    Smoking status: Never   Smokeless tobacco: Never  Vaping Use   Vaping Use: Never used  Substance Use Topics   Alcohol use: No   Drug use: No    No Known Allergies  Current Facility-Administered Medications  Medication Dose Route Frequency Provider Last Rate Last Admin   0.9 %  sodium chloride infusion (Manually program via Guardrails IV Fluids)   Intravenous Once Para Skeans, MD       0.9 %  sodium chloride infusion   Intravenous Continuous Loletha Grayer, MD 50 mL/hr at 06/12/22 1317 New Bag at 06/12/22 1317   alum & mag hydroxide-simeth (MAALOX/MYLANTA) 200-200-20 MG/5ML suspension 30 mL  30 mL Oral Q4H PRN Loletha Grayer, MD   30 mL at 06/11/22 0432   [START ON 06/13/2022] cefoTEtan (CEFOTAN) 2 g in sodium chloride 0.9 % 100 mL IVPB  2 g Intravenous Q12H Raheim Beutler F, MD       [START ON 06/13/2022] Chlorhexidine Gluconate Cloth 2 % PADS 6 each  6 each Topical Q0600 Nicasio Barlowe F, MD       dextrose 50 % solution 0-50 mL  0-50 mL Intravenous PRN Para Skeans, MD       erythromycin ethylsuccinate (EES) 200 MG/5ML suspension 52 mg  52 mg Oral TID AC Wieting, Richard, MD   52 mg at 06/12/22 1310   feeding supplement (ENSURE ENLIVE / ENSURE PLUS) liquid 237  mL  237 mL Oral TID BM Loletha Grayer, MD   237 mL at 06/12/22 0912   FLUoxetine (PROZAC) capsule 10 mg  10 mg Oral Daily Bennett, Christal H, NP   10 mg at 06/12/22 0856   hydrocortisone (CORTEF) tablet 10 mg  10 mg Oral Daily Loletha Grayer, MD       Derrill Memo ON 06/13/2022] hydrocortisone (CORTEF) tablet 20 mg  20 mg Oral Q breakfast Wieting, Richard, MD       hydrOXYzine (ATARAX) tablet 10 mg  10 mg Oral TID PRN Patrecia Pour, NP       insulin aspart (novoLOG) injection 0-5 Units  0-5 Units Subcutaneous QHS Wieting, Richard, MD       insulin aspart (novoLOG) injection 0-6 Units  0-6 Units Subcutaneous TID WC Loletha Grayer, MD   3 Units at 06/12/22 1309   insulin aspart (novoLOG) injection 3 Units  3 Units Subcutaneous TID  WC Wieting, Richard, MD       insulin glargine-yfgn [facility]) injection 5 Units  5 Units Subcutaneous BID Loletha Grayer, MD       levothyroxine (SYNTHROID) tablet 50 mcg  50 mcg Oral Q0600 Loletha Grayer, MD   50 mcg at 06/12/22 0630   lidocaine (XYLOCAINE) 2 % viscous mouth solution 15 mL  15 mL Mouth/Throat Once Loletha Grayer, MD       metoCLOPramide (REGLAN) injection 5 mg  5 mg Intravenous TID AC Wieting, Richard, MD   5 mg at 06/12/22 1309   multivitamin with minerals tablet 1 tablet  1 tablet Oral Daily Loletha Grayer, MD       Derrill Memo ON 06/13/2022] pantoprazole (PROTONIX) EC tablet 40 mg  40 mg Oral Daily Benita Gutter, RPH       promethazine (PHENERGAN) tablet 12.5 mg  12.5 mg Oral Q6H PRN Loletha Grayer, MD   12.5 mg at 06/11/22 1249     Review of Systems Full ROS  was asked and was negative except for the information on the HPI  Physical Exam Blood pressure (!) 153/84, pulse 84, temperature (!) 97.2 F (36.2 C), resp. rate 16, height 5\' 1"  (1.549 m), weight 36.9 kg, SpO2 100 %, not currently breastfeeding. CONSTITUTIONAL: NAD, malnourished EYES: Pupils are equal, round, and reactive to light, Sclera are non-icteric. EARS, NOSE, MOUTH AND THROAT: The oropharynx is clear. The oral mucosa is pink and moist. Hearing is intact to voice. LYMPH NODES:  Lymph nodes in the neck are normal. RESPIRATORY:  Lungs are clear. There is normal respiratory effort, with equal breath sounds bilaterally, and without pathologic use of accessory muscles. CARDIOVASCULAR: Heart is regular without murmurs, gallops, or rubs. GI: The abdomen is  soft, nontender, and nondistended. There are no palpable masses. There is no hepatosplenomegaly. There are normal bowel sounds in all quadrants. GU: Rectal deferred.   MUSCULOSKELETAL: Normal muscle strength and tone. No cyanosis or edema.   SKIN: Turgor is good and there are no pathologic skin lesions or ulcers. NEUROLOGIC: Motor and sensation is  grossly normal. Cranial nerves are grossly intact. PSYCH:  Oriented to person, place and time. Affect is normal.  Data Reviewed  I have personally reviewed the patient's imaging, laboratory findings and medical records.    Assessment/Plan 32 year old female with failure to thrive severe malnutrition and severe gastroparesis.  Discussed with the patient in detail.  I do agree with jejunostomy feeding tube to improve her condition.  I do think that we can do this robotically.  Procedure discussed with her  in detail.  Risks, benefits and possible complications including but not limited to: Bleeding, infection injury to adjacent organs, obstruction.  She understands and wishes to proceed. Will tentatively get her scheduled for tomorrow.  We will type and cross her in case we need to give her any blood products given the chronicity of the thrombocytopenia and anemia Please note that I have spent 75 minutes in this encounter including personally reviewing imaging studies, coordinating her care, placing orders, counseling the patient and performing appropriate documentation   Sterling Big, MD FACS General Surgeon 06/12/2022, 2:27 PM

## 2022-06-12 NOTE — Assessment & Plan Note (Addendum)
Diabetic gastroparesis.  Continue trial of Reglan and erythromycin (limit erythromycin to 2 weeks). GI recommendations from 1/18-19 reviewed.   She has persistent N/V and abdominal pain on tube feeds. 1/26: since stopping Reglan yesterday and increasing erythromycin, reducing tube feed rate, she feels much better. Actually feels like eating for the first time. 1/27>>31: continues to do well, tolerating meals, no N/V. --Discussed with GI (1/25), appreciate input --Stopped Reglan (hx of this not working for her & could be contributing to N/V)  --Treated with erythromycin to 100 mg TID with excellent response.  No N/V recurrence for some time.   --Per GI, erythromycin duration max 2 weeks.  It has been tapered and discontinued --Monitor closely for recurrent symptoms --May ultimately benefit from tertiary center referral for consideration of G-POEM procedure --Advised patient keep food diary & learn which foods are best tolerated, and look at [facility] patient info PDF on gastroparesis diet --Appreciate collaboration with Dietitian.

## 2022-06-12 NOTE — Progress Notes (Signed)
Progress Note   Patient: Ashley Camacho HDQ:[id number] DOB: 04-20-1991 DOA: 06/09/2022     3 DOS: the patient was seen and examined on 06/12/2022   Brief hospital course: 32 year old female brought to the emergency room for weakness, vomiting and lethargy.  She was found to be in diabetic ketoacidosis.  She also has a history of depression and stopped taking her medication secondary to cost.  Patient has a history of adrenal insufficiency and diabetes.  1/18.  Patient interested in having a place to stay to help take care of her.  Case discussed with psychiatry to see the patient in consultation.  Tapering off insulin drip to long-acting insulin. 1/19.  Patient agreeable to Dobbhoff feeding tube.  IR placed but it still in the stomach at this point.  Will need an x-ray tomorrow to confirm that it is advanced before using. 1/20.  Dobbhoff tube has not advanced through the stomach despite 3 x-rays.  Case discussed with general surgery Dr. Dahlia Byes and he will take to the operating room for a jejunostomy tube tomorrow.  Dobbhoff tube removed.   Assessment and Plan: * Protein-calorie malnutrition, severe Appreciate dietitian consultation.  Dobbhoff tube removed because it has not advanced despite doing 3 x-rays.  Dr. Adora Fridge to take to the operating room tomorrow for jejunostomy tube and then we can start tube feedings likely on Monday.  Hypotension Improved with IV fluids.  And restarting steroids.  DKA (diabetic ketoacidosis) ([place]) Type 1 diabetes with diabetic ketoacidosis.  Increase long-acting insulin to 5 units twice a day will also give sliding scale insulin.  Advanced diet but only able to tolerate Jell-O.  Adrenal insufficiency ([place]) Changed to Cortef this afternoon.  Will likely have to give Solu-Cortef tomorrow morning.  Acute metabolic encephalopathy Secondary to diabetic ketoacidosis.  This has improved.  Hypothyroidism TSH slightly high.  Restart levothyroxine at a slightly  higher dose.  Thrombocytopenia ([place]) Looking back at labs this seems chronic in nature.  Underweight BMI 15.37.  Hypomagnesemia Replaced  Pseudohyponatremia Sodium initially low secondary to diabetic ketoacidosis.  Last sodium in the normal range.  Anemia of chronic disease Received 1 unit of packed red blood cells for hemoglobin of 7.0.  Ferritin elevated at 1511.  Last hemoglobin 8.3  Gastroparesis Diabetic gastroparesis.  Trial of Reglan and erythromycin  Depression Appreciate psychiatric consultation.  Prozac started.  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  Creatinine 2.58 on presentation and down to 1.38        Subjective: Patient only able to eat a little Jell-O.  Still having some acid reflux.  Her Dobbhoff tube has not advanced through the stomach at this point in time.  Dr. Dahlia Byes general surgery will take to the operating room tomorrow for jejunostomy tube.  Physical Exam: Vitals:   06/11/22 1157 06/11/22 2113 06/12/22 0538 06/12/22 0829  BP: 128/81 127/75 (!) 149/81 (!) 153/84  Pulse: 88 88 86 84  Resp: 15 16 18 16   Temp: 97.8 F (36.6 C) 98.2 F (36.8 C) 98.4 F (36.9 C) (!) 97.2 F (36.2 C)  TempSrc:      SpO2: 100% 99% 98% 100%  Weight:      Height:       Physical Exam HENT:     Head: Normocephalic.     Mouth/Throat:     Pharynx: No oropharyngeal exudate.  Eyes:     General: Lids are normal.     Conjunctiva/sclera: Conjunctivae  normal.  Cardiovascular:     Rate and Rhythm: Normal rate and regular rhythm.     Heart sounds: Normal heart sounds, S1 normal and S2 normal.  Pulmonary:     Breath sounds: No decreased breath sounds, wheezing, rhonchi or rales.  Abdominal:     Palpations: Abdomen is soft.     Tenderness: There is no abdominal tenderness.  Musculoskeletal:     Right lower leg: No swelling.     Left lower leg: No swelling.  Skin:    General: Skin is warm.      Findings: No rash.  Neurological:     Mental Status: She is alert and oriented to person, place, and time.     Data Reviewed: Dobbhoff tube has not advanced through the stomach at this point. Creatinine 1.38, hemoglobin 8.3, platelet count 88   Disposition: Status is: Inpatient Remains inpatient appropriate because: Patient will get a jejunostomy tube tomorrow  Planned Discharge Destination: To be determined    Time spent: 28 minutes  Author: Loletha Grayer, MD 06/12/2022 3:26 PM  For on call review [email or web address].

## 2022-06-13 ENCOUNTER — Inpatient Hospital Stay: Payer: Medicaid Other | Admitting: Certified Registered Nurse Anesthetist

## 2022-06-13 ENCOUNTER — Other Ambulatory Visit: Payer: Self-pay

## 2022-06-13 ENCOUNTER — Encounter
Admission: EM | Disposition: A | Discharge status: Home / Self Care | Payer: Self-pay | Source: Home / Self Care | Attending: Internal Medicine

## 2022-06-13 DIAGNOSIS — G9341 Metabolic encephalopathy: Secondary | ICD-10-CM | POA: Diagnosis not present

## 2022-06-13 DIAGNOSIS — K3184 Gastroparesis: Secondary | ICD-10-CM | POA: Diagnosis not present

## 2022-06-13 DIAGNOSIS — E43 Unspecified severe protein-calorie malnutrition: Secondary | ICD-10-CM | POA: Diagnosis not present

## 2022-06-13 DIAGNOSIS — R7989 Other specified abnormal findings of blood chemistry: Secondary | ICD-10-CM | POA: Diagnosis not present

## 2022-06-13 DIAGNOSIS — E2740 Unspecified adrenocortical insufficiency: Secondary | ICD-10-CM | POA: Diagnosis not present

## 2022-06-13 DIAGNOSIS — D638 Anemia in other chronic diseases classified elsewhere: Secondary | ICD-10-CM | POA: Diagnosis not present

## 2022-06-13 DIAGNOSIS — N179 Acute kidney failure, unspecified: Secondary | ICD-10-CM | POA: Diagnosis not present

## 2022-06-13 DIAGNOSIS — E039 Hypothyroidism, unspecified: Secondary | ICD-10-CM | POA: Diagnosis not present

## 2022-06-13 DIAGNOSIS — I951 Orthostatic hypotension: Secondary | ICD-10-CM | POA: Diagnosis not present

## 2022-06-13 DIAGNOSIS — D696 Thrombocytopenia, unspecified: Secondary | ICD-10-CM | POA: Diagnosis not present

## 2022-06-13 HISTORY — PX: JEJUNOSTOMY: SHX313

## 2022-06-13 LAB — GLUCOSE, CAPILLARY
Glucose-Capillary: 52 mg/dL — ABNORMAL LOW (ref 70–99)
Glucose-Capillary: 196 mg/dL — ABNORMAL HIGH (ref 70–99)
Glucose-Capillary: 274 mg/dL — ABNORMAL HIGH (ref 70–99)
Glucose-Capillary: 277 mg/dL — ABNORMAL HIGH (ref 70–99)
Glucose-Capillary: 161 mg/dL — ABNORMAL HIGH (ref 70–99)

## 2022-06-13 LAB — PHOSPHORUS
Phosphorus: 1.2 mg/dL — ABNORMAL LOW (ref 2.5–4.6)
Phosphorus: 1.4 mg/dL — ABNORMAL LOW (ref 2.5–4.6)
Phosphorus: 1.3 mg/dL — ABNORMAL LOW (ref 2.5–4.6)

## 2022-06-13 LAB — CBC
HCT: 23.6 % — ABNORMAL LOW (ref 36.0–46.0)
Hemoglobin: 8.1 g/dL — ABNORMAL LOW (ref 12.0–15.0)
MCH: 30.8 pg (ref 26.0–34.0)
MCHC: 34.3 g/dL (ref 30.0–36.0)
MCV: 89.7 fL (ref 80.0–100.0)
Platelets: 84 10*3/uL — ABNORMAL LOW (ref 150–400)
RBC: 2.63 MIL/uL — ABNORMAL LOW (ref 3.87–5.11)
RDW: 13 % (ref 11.5–15.5)
WBC: 5.5 10*3/uL (ref 4.0–10.5)
nRBC: 0 % (ref 0.0–0.2)

## 2022-06-13 LAB — MAGNESIUM
Magnesium: 2.2 mg/dL (ref 1.7–2.4)
Magnesium: 1.9 mg/dL (ref 1.7–2.4)
Magnesium: 1.8 mg/dL (ref 1.7–2.4)

## 2022-06-13 SURGERY — CREATION, JEJUNOSTOMY
Anesthesia: General | Site: Abdomen

## 2022-06-13 MED ORDER — OXYCODONE HCL 5 MG PO TABS
ORAL_TABLET | ORAL | Status: AC
  Administered 2022-06-13: 5 mg via ORAL
  Filled 2022-06-13: qty 1

## 2022-06-13 MED ORDER — ONDANSETRON HCL 4 MG/2ML IJ SOLN: 4.0000 mg | Freq: Once | INTRAMUSCULAR | Status: DC | PRN

## 2022-06-13 MED ORDER — HYDROCORTISONE 10 MG PO TABS
10.0000 mg | ORAL_TABLET | Freq: Every day | ORAL | Status: DC
  Filled 2022-06-13 (×2): qty 1

## 2022-06-13 MED ORDER — INSULIN ASPART 100 UNIT/ML IJ SOLN
3.0000 [IU] | Freq: Three times a day (TID) | INTRAMUSCULAR | Status: DC
  Administered 2022-06-14: 3 [IU] via SUBCUTANEOUS
  Filled 2022-06-13: qty 1

## 2022-06-13 MED ORDER — KETOROLAC TROMETHAMINE 15 MG/ML IJ SOLN
15.0000 mg | Freq: Four times a day (QID) | INTRAMUSCULAR | Status: DC
  Administered 2022-06-13 – 2022-06-14 (×3): 15 mg via INTRAVENOUS
  Filled 2022-06-13 (×3): qty 1

## 2022-06-13 MED ORDER — FENTANYL CITRATE (PF) 100 MCG/2ML IJ SOLN
INTRAMUSCULAR | Status: AC
  Filled 2022-06-13: qty 2

## 2022-06-13 MED ORDER — INSULIN GLARGINE-YFGN 100 UNIT/ML ~~LOC~~ SOLN
5.0000 [IU] | Freq: Every day | SUBCUTANEOUS | Status: DC
  Administered 2022-06-13: 5 [IU] via SUBCUTANEOUS
  Filled 2022-06-13 (×2): qty 0.05

## 2022-06-13 MED ORDER — OSMOLITE 1.2 CAL PO LIQD
1000.0000 mL | ORAL | Status: DC
  Filled 2022-06-13 (×2): qty 1000

## 2022-06-13 MED ORDER — SUGAMMADEX SODIUM 200 MG/2ML IV SOLN
INTRAVENOUS | Status: DC | PRN
  Administered 2022-06-13: 100 mg via INTRAVENOUS

## 2022-06-13 MED ORDER — BUPIVACAINE LIPOSOME 1.3 % IJ SUSP
INTRAMUSCULAR | Status: DC | PRN
  Administered 2022-06-13: 20 mL

## 2022-06-13 MED ORDER — MIDAZOLAM HCL 2 MG/2ML IJ SOLN
INTRAMUSCULAR | Status: DC | PRN
  Administered 2022-06-13 (×2): 1 mg via INTRAVENOUS

## 2022-06-13 MED ORDER — INSULIN GLARGINE-YFGN 100 UNIT/ML ~~LOC~~ SOLN
4.0000 [IU] | Freq: Two times a day (BID) | SUBCUTANEOUS | Status: DC
  Filled 2022-06-13: qty 0.04

## 2022-06-13 MED ORDER — OXYCODONE HCL 5 MG/5ML PO SOLN: 5.0000 mg | Freq: Once | ORAL | Status: AC | PRN

## 2022-06-13 MED ORDER — ROCURONIUM BROMIDE 100 MG/10ML IV SOLN
INTRAVENOUS | Status: DC | PRN
  Administered 2022-06-13: 20 mg via INTRAVENOUS

## 2022-06-13 MED ORDER — BUPIVACAINE-EPINEPHRINE 0.25% -1:200000 IJ SOLN
INTRAMUSCULAR | Status: DC | PRN
  Administered 2022-06-13: 30 mL

## 2022-06-13 MED ORDER — HYDROCORTISONE 10 MG PO TABS
20.0000 mg | ORAL_TABLET | Freq: Every day | ORAL | Status: DC
  Filled 2022-06-13: qty 2

## 2022-06-13 MED ORDER — HYDROCORTISONE SOD SUC (PF) 100 MG IJ SOLR
100.0000 mg | INTRAMUSCULAR | Status: AC
  Filled 2022-06-13: qty 2

## 2022-06-13 MED ORDER — FENTANYL CITRATE (PF) 100 MCG/2ML IJ SOLN
25.0000 ug | INTRAMUSCULAR | Status: DC | PRN
  Administered 2022-06-13: 50 ug via INTRAVENOUS

## 2022-06-13 MED ORDER — PHENYLEPHRINE HCL (PRESSORS) 10 MG/ML IV SOLN
INTRAVENOUS | Status: DC | PRN
  Administered 2022-06-13: 80 ug via INTRAVENOUS

## 2022-06-13 MED ORDER — MIDAZOLAM HCL 2 MG/2ML IJ SOLN
INTRAMUSCULAR | Status: AC
  Filled 2022-06-13: qty 2

## 2022-06-13 MED ORDER — FENTANYL CITRATE (PF) 100 MCG/2ML IJ SOLN
INTRAMUSCULAR | Status: DC | PRN
  Administered 2022-06-13 (×4): 25 ug via INTRAVENOUS

## 2022-06-13 MED ORDER — LIDOCAINE HCL (CARDIAC) PF 100 MG/5ML IV SOSY
PREFILLED_SYRINGE | INTRAVENOUS | Status: DC | PRN
  Administered 2022-06-13: 60 mg via INTRAVENOUS

## 2022-06-13 MED ORDER — PROPOFOL 10 MG/ML IV BOLUS
INTRAVENOUS | Status: DC | PRN
  Administered 2022-06-13: 50 mg via INTRAVENOUS

## 2022-06-13 MED ORDER — FENTANYL CITRATE (PF) 100 MCG/2ML IJ SOLN
INTRAMUSCULAR | Status: AC
  Administered 2022-06-13: 50 ug via INTRAVENOUS
  Filled 2022-06-13: qty 2

## 2022-06-13 MED ORDER — ACETAMINOPHEN 500 MG PO TABS
1000.0000 mg | ORAL_TABLET | Freq: Four times a day (QID) | ORAL | Status: DC
  Administered 2022-06-13 – 2022-06-18 (×19): 1000 mg via ORAL
  Filled 2022-06-13 (×22): qty 2

## 2022-06-13 MED ORDER — OXYCODONE HCL 5 MG PO TABS
5.0000 mg | ORAL_TABLET | ORAL | Status: DC | PRN
  Administered 2022-06-14 – 2022-06-15 (×3): 5 mg via ORAL
  Filled 2022-06-13 (×3): qty 1

## 2022-06-13 MED ORDER — WHITE PETROLATUM EX OINT
TOPICAL_OINTMENT | CUTANEOUS | Status: AC
  Filled 2022-06-13: qty 5

## 2022-06-13 MED ORDER — FREE WATER
120.0000 mL | Freq: Four times a day (QID) | Status: AC
  Administered 2022-06-13 – 2022-06-18 (×19): 120 mL

## 2022-06-13 MED ORDER — 0.9 % SODIUM CHLORIDE (POUR BTL) OPTIME
TOPICAL | Status: DC | PRN
  Administered 2022-06-13: 500 mL

## 2022-06-13 MED ORDER — OXYCODONE HCL 5 MG PO TABS: 5.0000 mg | ORAL_TABLET | Freq: Once | ORAL | Status: AC | PRN

## 2022-06-13 MED ORDER — POTASSIUM PHOSPHATES 15 MMOLE/5ML IV SOLN
30.0000 mmol | Freq: Once | INTRAVENOUS | Status: AC
  Administered 2022-06-13: 30 mmol via INTRAVENOUS
  Filled 2022-06-13: qty 10

## 2022-06-13 MED ORDER — ONDANSETRON HCL 4 MG/2ML IJ SOLN
INTRAMUSCULAR | Status: DC | PRN
  Administered 2022-06-13: 3 mg via INTRAVENOUS

## 2022-06-13 SURGICAL SUPPLY — 67 items
APPLIER CLIP 11 MED OPEN (CLIP)
APPLIER CLIP 13 LRG OPEN (CLIP)
BARRIER ADH SEPRAFILM 3INX5IN (MISCELLANEOUS) IMPLANT
BLADE CLIPPER SURG (BLADE) ×2 IMPLANT
CATH ROBINSON RED A/P 18FR (CATHETERS) ×1 IMPLANT
CHLORAPREP W/TINT 26 (MISCELLANEOUS) ×2 IMPLANT
CLIP APPLIE 11 MED OPEN (CLIP) IMPLANT
CLIP APPLIE 13 LRG OPEN (CLIP) IMPLANT
COVER BACK TABLE REUSABLE LG (DRAPES) IMPLANT
DERMABOND ADVANCED .7 DNX12 (GAUZE/BANDAGES/DRESSINGS) ×2 IMPLANT
DRAPE LAPAROTOMY 100X77 ABD (DRAPES) ×2 IMPLANT
DRSG OPSITE POSTOP 4X10 (GAUZE/BANDAGES/DRESSINGS) IMPLANT
DRSG OPSITE POSTOP 4X12 (GAUZE/BANDAGES/DRESSINGS) IMPLANT
DRSG OPSITE POSTOP 4X6 (GAUZE/BANDAGES/DRESSINGS) ×1 IMPLANT
ELECT BLADE 6.5 EXT (BLADE) ×2 IMPLANT
ELECT CAUTERY BLADE 6.4 (BLADE) ×2 IMPLANT
ELECT REM PT RETURN 9FT ADLT (ELECTROSURGICAL) ×4
ELECTRODE REM PT RTRN 9FT ADLT (ELECTROSURGICAL) ×4 IMPLANT
GLOVE BIO SURGEON STRL SZ7 (GLOVE) ×8 IMPLANT
GOWN STRL REUS W/ TWL LRG LVL3 (GOWN DISPOSABLE) ×10 IMPLANT
GOWN STRL REUS W/TWL LRG LVL3 (GOWN DISPOSABLE) ×10
IRRIGATION STRYKERFLOW (MISCELLANEOUS) IMPLANT
IRRIGATOR STRYKERFLOW (MISCELLANEOUS)
KIT PINK PAD W/HEAD ARE REST (MISCELLANEOUS) ×2 IMPLANT
KIT PINK PAD W/HEAD ARM REST (MISCELLANEOUS) ×2 IMPLANT
LABEL OR SOLS (LABEL) ×2 IMPLANT
LIGASURE IMPACT 36 18CM CVD LR (INSTRUMENTS) IMPLANT
MANIFOLD NEPTUNE II (INSTRUMENTS) ×4 IMPLANT
NEEDLE HYPO 22GX1.5 SAFETY (NEEDLE) ×6 IMPLANT
PACK BASIN MAJOR ARMC (MISCELLANEOUS) ×2 IMPLANT
PENCIL SMOKE EVACUATOR (MISCELLANEOUS) ×2 IMPLANT
PLUG CATH AND CAP STER (CATHETERS) ×1 IMPLANT
RELOAD PROXIMATE 75MM BLUE (ENDOMECHANICALS) IMPLANT
RELOAD STAPLE 75 3.8 BLU REG (ENDOMECHANICALS) IMPLANT
SPONGE DRAIN TRACH 4X4 STRL 2S (GAUZE/BANDAGES/DRESSINGS) ×1 IMPLANT
SPONGE T-LAP 18X18 ~~LOC~~+RFID (SPONGE) ×4 IMPLANT
SPONGE T-LAP 18X36 ~~LOC~~+RFID STR (SPONGE) IMPLANT
STAPLER PROXIMATE 75MM BLUE (STAPLE) IMPLANT
STAPLER SKIN PROX 35W (STAPLE) ×2 IMPLANT
SUT ETHILON 3-0 FS-10 30 BLK (SUTURE) ×2
SUT MNCRL 4-0 (SUTURE) ×2
SUT MNCRL 4-0 27XMFL (SUTURE) ×2
SUT PDS AB 0 CT1 27 (SUTURE) ×6 IMPLANT
SUT SILK 2 0 (SUTURE) ×2
SUT SILK 2 0 SH CR/8 (SUTURE) ×3 IMPLANT
SUT SILK 2 0SH CR/8 30 (SUTURE) ×2 IMPLANT
SUT SILK 2-0 18XBRD TIE 12 (SUTURE) ×2 IMPLANT
SUT VIC AB 0 CT1 36 (SUTURE) ×4 IMPLANT
SUT VIC AB 2-0 SH 27 (SUTURE)
SUT VIC AB 2-0 SH 27XBRD (SUTURE) IMPLANT
SUT VIC AB 3-0 SH 27 (SUTURE) ×2
SUT VIC AB 3-0 SH 27X BRD (SUTURE) ×2 IMPLANT
SUT VICRYL 0 UR6 27IN ABS (SUTURE) ×4 IMPLANT
SUTURE EHLN 3-0 FS-10 30 BLK (SUTURE) ×2 IMPLANT
SUTURE MNCRL 4-0 27XMF (SUTURE) ×2 IMPLANT
SYR 20ML LL LF (SYRINGE) ×6 IMPLANT
SYR 30ML LL (SYRINGE) ×2 IMPLANT
SYR 3ML LL SCALE MARK (SYRINGE) ×2 IMPLANT
SYR BULB IRRIG 60ML STRL (SYRINGE) ×1 IMPLANT
TAPE TRANSPORE STRL 2 31045 (GAUZE/BANDAGES/DRESSINGS) ×2 IMPLANT
TRAP FLUID SMOKE EVACUATOR (MISCELLANEOUS) ×4 IMPLANT
TRAY FOLEY MTR SLVR 16FR STAT (SET/KITS/TRAYS/PACK) ×2 IMPLANT
TUBE GASTRO 14F 5C (TUBING) IMPLANT
TUBE GASTRO FEEDING 16FR (TUBING) IMPLANT
TUBE JEJUNO 22X14 (TUBING) IMPLANT
TUBE MIC GASTROSTOMY 18FR 7-10 (TUBING) IMPLANT
WATER STERILE IRR 500ML POUR (IV SOLUTION) ×4 IMPLANT

## 2022-06-13 NOTE — Op Note (Signed)
PROCEDURES: Open Jejunostomy tube # 18 FR  Pre-operative Diagnosis:Malnutrition  Post-operative Diagnosis: same  Surgeon: [place]   Anesthesia: General endotracheal anesthesia  ASA Class: 3   Surgeon: Caroleen Hamman , MD FACS  Anesthesia: Gen. with endotracheal tube  Assistant:Haley PA-S  Findings: NO evdidence of acute intra-abdominal pathology   Estimated Blood Loss: 5cc                  Complications: none                Condition: stable  Procedure Details  The patient was seen again in the Holding Room. The benefits, complications, treatment options, and expected outcomes were discussed with the patient. The risks of bleeding, infection, recurrence of symptoms, failure to resolve symptoms,  bowel injury, any of which could require further surgery were reviewed with the patient.   The patient was taken to Operating Room, identified as Ashley Camacho and the procedure verified.  A Time Out was held and the above information confirmed.  Prior to the induction of general anesthesia, antibiotic prophylaxis was administered. VTE prophylaxis was in place. General endotracheal anesthesia was then administered and tolerated well. After the induction, the abdomen was prepped with Chloraprep and draped in the sterile fashion. The patient was positioned in the supine position.  Midline laparotomy incision created supraumbilically using 10 blade.  Electrocautery used to dissect the subcutaneous tissue and the fascia was elevated and open between Kocher's.  We accessed the abdominal cavity under direct visualization there was no evidence of injuries.  We enlarged our fascial incision.  The ligament of Treitz was found and we selected an area 20 cm from the ligament of Treitz.  A small enterotomy was performed with electrocautery.  An additional incision was created in the left upper quadrant and a red rubber catheter was fed through the abdominal wall defect.  Was able to use a  diamond suture at the jejunum and was able to thread the rubber catheter through the enterotomy.  The pursestring was tied to secure the catheter.  I did perform a Witzel jejunostomy with multiple interrupted 2 silk sutures.  Then jejunum was tacked to the abdominal wall in 4 corners using 2 oh silks. Liposomal Marcaine  was injected under direct visualization as a TAP block. The fascia was closed using the small bite technique using -0 PDS suture in a running fashion and the skin was closed with a pulse.  Needle and laparotomy count were correct and there were no immediate occasions  Caroleen Hamman, MD, FACS

## 2022-06-13 NOTE — Progress Notes (Signed)
Patient transferred to pre-op in bed with transport in stable condition in preparation for j-tube placement.

## 2022-06-13 NOTE — Progress Notes (Signed)
Patient arrived from PACU in bed.  Patient re-assessed with complaints of "rib pain 6/10.  Patient educated increased pain due to CO2 used during surgery to open abdominal cavity.  Advised patient to increase functional mobility to decrease abdominal discomfort from surgical gas.

## 2022-06-13 NOTE — Anesthesia Procedure Notes (Signed)
Arterial Line Insertion Performed by: Arita Miss, MD, anesthesiologist  Patient location: OR. Preanesthetic checklist: patient identified, IV checked, site marked, risks and benefits discussed, surgical consent, monitors and equipment checked, pre-op evaluation, timeout performed and anesthesia consent Lidocaine 1% used for infiltration and patient sedated Right, radial was placed Catheter size: 20 G Hand hygiene performed  and maximum sterile barriers used   Attempts: 2 Procedure performed using ultrasound guided technique. Ultrasound Notes:anatomy identified, needle tip was noted to be adjacent to the nerve/plexus identified and no ultrasound evidence of intravascular and/or intraneural injection Following insertion, dressing applied. Post procedure assessment: normal and unchanged  Post procedure complications: unsuccessful attempts. Additional procedure comments: One unsuccessful attempt, unsure if artery was entered, likely venous. Second attempt successful. Both USG.Marland Kitchen

## 2022-06-13 NOTE — Progress Notes (Addendum)
Nutrition Brief Note  RD received secure chat from surgeon to initiate enteral nutrition via J-tube tonight. Please see full RD assessment note from 1/18. Will dc calorie count since pt has a feeding tube present.   Tube Feeds via J-tube at 2000: - Initiate Osmolite 1.2 @ 20 ml/hr and increase by 10 ml every 12 hours to goal rate of 60 ml/hr.  -120 ml free water flush every 6 hours - Tube feeding regimen provides 1728 kcal (100% of needs), 80 grams of protein, and 1181 ml of H2O. Total free water: 1661 ml daily  Pt is at high refeeding risk. Monitor magnesium, potassium, and phosphorus BID for at least 3 days, MD to replete as needed.  Hermina Barters RD, LDN Clinical Dietitian See Shea Evans for contact information.

## 2022-06-13 NOTE — Anesthesia Preprocedure Evaluation (Signed)
Anesthesia Evaluation  Patient identified by MRN, date of birth, ID band Patient awake  General Assessment Comment:  65F admitted with DKA, failure to thrive. Hypoglycemic this morning requiring dextrose.  Hx PEA arrest during endoscopy 07/2021  Reviewed: Allergy & Precautions, NPO status , Patient's Chart, lab work & pertinent test results  History of Anesthesia Complications Negative for: history of anesthetic complications  Airway Mallampati: II  TM Distance: >3 FB Neck ROM: Full    Dental  (+) Poor Dentition, Chipped   Pulmonary neg pulmonary ROS, neg sleep apnea, neg COPD, Patient abstained from smoking.Not current smoker   Pulmonary exam normal breath sounds clear to auscultation       Cardiovascular Exercise Tolerance: Good METShypertension, Pt. on medications +CHF  (-) CAD and (-) Past MI (-) dysrhythmias  Rhythm:Regular Rate:Normal - Systolic murmurs Patient had PEA arrest during EGD 07/2021 requiring chest compressions and 2mg  epinephrine.  01/03/22 TTE: INTERPRETATION  SEVERE LV SYSTOLIC DYSFUNCTION (EF 78%) MILD RV SYSTOLIC DYSFUNCTION (Grade 3 restrictive pattern) NO VALVULAR STENOSIS  SMALL PERICARDIAL EFFUSION MILD TO MODERATE MR  COMPARED TO PRIOR STUDY 08/01/2021:  NO SIGNIFICANT CHANGE     Neuro/Psych  PSYCHIATRIC DISORDERS Anxiety Depression     Neuromuscular disease    GI/Hepatic ,neg GERD  ,,(+)     (-) substance abuse    Endo/Other  diabetes, Poorly Controlled, Type 1Hypothyroidism    Renal/GU CRFRenal disease     Musculoskeletal   Abdominal   Peds  Hematology   Anesthesia Other Findings Past Medical History: No date: Adrenal insufficiency (HCC) No date: Anemia No date: Gastroparesis No date: Hypertension No date: Type 1 diabetes (HCC)  Reproductive/Obstetrics                              Anesthesia Physical Anesthesia Plan  ASA: 4  Anesthesia Plan:  General   Post-op Pain Management: Ofirmev IV (intra-op)*   Induction: Intravenous  PONV Risk Score and Plan: 4 or greater and Ondansetron, Dexamethasone, Midazolam and Treatment may vary due to age or medical condition  Airway Management Planned: Oral ETT  Additional Equipment: Arterial line  Intra-op Plan:   Post-operative Plan: Extubation in OR  Informed Consent: I have reviewed the patients History and Physical, chart, labs and discussed the procedure including the risks, benefits and alternatives for the proposed anesthesia with the patient or authorized representative who has indicated his/her understanding and acceptance.     Dental advisory given  Plan Discussed with: CRNA and Surgeon  Anesthesia Plan Comments: (Patient counseled on being higher risk for anesthesia due to comorbidities: HFrEF. Patient was told about increased risk of cardiac and respiratory events, including death. Given her cardiac issues, I believe insufflation from robotic surgery would cause undue harm, especially considering she didn't tolerate anesthesia for endoscopy last year. I discussed with surgeon Dr Dahlia Byes and we have decided to proceed with Open J tube rather than robotic. I believe this will reduce her anesthetic risk as much as we can. Plan for pre induction arterial line. Discussed risks of anesthesia with patient, including PONV, sore throat, lip/dental/eye damage. Rare risks discussed as well, such as cardiorespiratory and neurological sequelae, and allergic reactions. Discussed the role of CRNA in patient's perioperative care. Patient understands.  )         Anesthesia Quick Evaluation

## 2022-06-13 NOTE — Anesthesia Procedure Notes (Signed)
Procedure Name: Intubation Date/Time: 06/13/2022 12:57 PM  Performed by: Johnna Acosta, CRNAPre-anesthesia Checklist: Patient identified, Emergency Drugs available, Suction available, Patient being monitored and Timeout performed Patient Re-evaluated:Patient Re-evaluated prior to induction Oxygen Delivery Method: Circle system utilized Preoxygenation: Pre-oxygenation with 100% oxygen Induction Type: IV induction Ventilation: Mask ventilation without difficulty Laryngoscope Size: McGraph and 3 Grade View: Grade I Tube type: Oral Tube size: 6.0 mm Number of attempts: 1 Airway Equipment and Method: Stylet and Video-laryngoscopy Placement Confirmation: ETT inserted through vocal cords under direct vision, positive ETCO2 and breath sounds checked- equal and bilateral Secured at: 20 cm Tube secured with: Tape Dental Injury: Teeth and Oropharynx as per pre-operative assessment

## 2022-06-13 NOTE — Assessment & Plan Note (Addendum)
Refeeding Syndrome Replaced on 1/22, 1/26, 1/27 1/28 given IV phos-Nak 1/29 phos 4.1 1/30 phos 3.3 Has stabilized, monitor intermittently.

## 2022-06-13 NOTE — Transfer of Care (Signed)
Immediate Anesthesia Transfer of Care Note  Patient: Ashley Camacho  Procedure(s) Performed: JEJUNOSTOMY (Abdomen)  Patient Location: PACU  Anesthesia Type:General  Level of Consciousness: awake, alert , and oriented  Airway & Oxygen Therapy: Patient Spontanous Breathing and Patient connected to face mask oxygen  Post-op Assessment: Report given to RN and Post -op Vital signs reviewed and stable  Post vital signs: Reviewed and stable  Last Vitals:  Vitals Value Taken Time  BP 102/59 06/13/22 1418  Temp 36.8 C 06/13/22 1415  Pulse 91 06/13/22 1418  Resp 15 06/13/22 1418  SpO2 100 % 06/13/22 1418  Vitals shown include unvalidated device data.  Last Pain:  Vitals:   06/13/22 0010  TempSrc: Oral  PainSc:          Complications: No notable events documented.

## 2022-06-13 NOTE — Progress Notes (Signed)
Hypoglycemic Event  CBG: 52  Treatment: Dextrose 50% 5ml  Symptoms: Pale  Follow-up CBG: Time:35 CBG Result:196  Possible Reasons for Event: NPO  Comments/MD notified:Dr Wieting notified via secure chat at [place]

## 2022-06-13 NOTE — Progress Notes (Signed)
Progress Note   Patient: Ashley Camacho [id number] DOB: 1991/03/11 DOA: 06/09/2022     4 DOS: the patient was seen and examined on 06/13/2022   Brief hospital course: 32 year old female brought to the emergency room for weakness, vomiting and lethargy.  She was found to be in diabetic ketoacidosis.  She also has a history of depression and stopped taking her medication secondary to cost.  Patient has a history of adrenal insufficiency and diabetes.  1/18.  Patient interested in having a place to stay to help take care of her.  Case discussed with psychiatry to see the patient in consultation.  Tapering off insulin drip to long-acting insulin. 1/19.  Patient agreeable to Dobbhoff feeding tube.  IR placed but it still in the stomach at this point.  Will need an x-ray tomorrow to confirm that it is advanced before using. 1/20.  Dobbhoff tube has not advanced through the stomach despite 3 x-rays.  Case discussed with general surgery Dr. Dahlia Byes and he will take to the operating room for a jejunostomy tube tomorrow.  Dobbhoff tube removed. 1/21.  low sugar this morning requiring a amp of D50.  Assessment and Plan: * Protein-calorie malnutrition, severe Patient for jejunostomy tube today.  Hypotension Improved with IV fluids.  And restarting steroids.  DKA (diabetic ketoacidosis) ([place]) Type 1 diabetes with diabetic ketoacidosis.  Had hypoglycemia this morning and an amp of D50 given.  Will decrease long-acting insulin to 40 units twice daily starting tonight.  Patient given a dose of Solu-Cortef prior to the operating room.  Adrenal insufficiency (HCC) A dose of Solu-Cortef 100 mg prior to the operating room.  Likely can go back on Cortef this afternoon.  Acute metabolic encephalopathy Secondary to diabetic ketoacidosis.  This has improved.  Hypothyroidism TSH slightly high.  Restart levothyroxine at a slightly higher dose.  Thrombocytopenia ([place]) Looking back at labs this seems chronic  in nature.  Hypophosphatemia Will replace K-Phos today  Underweight BMI 15.37.  Hypomagnesemia Replaced  Pseudohyponatremia Sodium initially low secondary to diabetic ketoacidosis.  Last sodium in the normal range.  Anemia of chronic disease Received 1 unit of packed red blood cells for hemoglobin of 7.0.  Ferritin elevated at 1511.  Last hemoglobin 8.1  Gastroparesis Diabetic gastroparesis.  Trial of Reglan and erythromycin  Depression Appreciate psychiatric consultation.  Prozac started.  Acute kidney injury superimposed on chronic kidney disease ([place]) Acute kidney injury on CKD likely stage II.  Creatinine 2.58 on presentation and down to 1.38        Subjective: Patient this morning had a bunch of questions about the feeding tube.  She is hoping that she will be able to eat better in the future and not need the tube.  As of yesterday only able to tolerate Jell-O.  Physical Exam: Vitals:   06/12/22 2141 06/13/22 0010 06/13/22 0456 06/13/22 0827  BP: 107/81 (!) 140/86 (!) 151/83 (!) 158/83  Pulse: 98 82 75 74  Resp: 16 16 18 16   Temp: (!) 97.5 F (36.4 C) 98.5 F (36.9 C) 98.4 F (36.9 C) 97.8 F (36.6 C)  TempSrc: Oral Oral    SpO2: 100% 99% 100% 100%  Weight:      Height:       Physical Exam HENT:     Head: Normocephalic.     Mouth/Throat:     Pharynx: No oropharyngeal exudate.  Eyes:     General: Lids are normal.     Conjunctiva/sclera: Conjunctivae normal.  Cardiovascular:  Rate and Rhythm: Normal rate and regular rhythm.     Heart sounds: Normal heart sounds, S1 normal and S2 normal.  Pulmonary:     Breath sounds: No decreased breath sounds, wheezing, rhonchi or rales.  Abdominal:     Palpations: Abdomen is soft.     Tenderness: There is no abdominal tenderness.  Musculoskeletal:     Right lower leg: No swelling.     Left lower leg: No swelling.  Skin:    General: Skin is warm.     Findings: No rash.  Neurological:     Mental Status:  She is alert and oriented to person, place, and time.     Data Reviewed: Hemoglobin 8.1, platelet count 84, magnesium 2.2, phosphorus 1.2   Disposition: Status is: Inpatient Remains inpatient appropriate because: Going to the OR today for jejunostomy tube  Planned Discharge Destination: To be determined    Time spent: 27 minutes  Author: Loletha Grayer, MD 06/13/2022 12:27 PM  For on call review [email or web address].

## 2022-06-13 NOTE — Inpatient Diabetes Management (Signed)
Inpatient Diabetes Program Recommendations  AACE/ADA: New Consensus Statement on Inpatient Glycemic Control (2015)  Target Ranges:  Prepandial:   less than 140 mg/dL      Peak postprandial:   less than 180 mg/dL (1-2 hours)      Critically ill patients:  140 - 180 mg/dL   Lab Results  Component Value Date   GLUCAP 196 (H) 06/13/2022   HGBA1C 10.2 (H) 02/28/2022    Review of Glycemic Control  Latest Reference Range & Units 06/13/22 08:33 06/13/22 09:30  Glucose-Capillary 70 - 99 mg/dL 52 (L) 196 (H)  (L): Data is abnormally low (H): Data is abnormally high  Diabetes history:  DM1/gastroparesis Outpatient Diabetes medications:  Lantus 6 units QD Novolog SSI Current orders for Inpatient glycemic control: Semglee 5 units BID, Novolog 0-6 units TID and 0-5 units QHS, Novolog 2 units TID with meals, cortef 10 mg QD, 20 QAM  Inpatient Diabetes Program Recommendations:    Noted steroids reduced.  Please consider Semglee 5 units QD.  Will continue to follow while inpatient.  Thank you, Reche Dixon, MSN, [place] Diabetes Coordinator Inpatient Diabetes Program [phone] (team pager from 8a-5p)

## 2022-06-13 NOTE — Progress Notes (Signed)
Due to hemodynamic concerns we will do open j tube. D/w the pt in detail. She agrees

## 2022-06-13 NOTE — Anesthesia Postprocedure Evaluation (Signed)
Anesthesia Post Note  Camacho: Ashley Camacho  Procedure(s) Performed: JEJUNOSTOMY (Abdomen)  Camacho location during evaluation: PACU Anesthesia Type: General Level of consciousness: awake and alert Pain management: pain level controlled Vital Signs Assessment: post-procedure vital signs reviewed and stable Respiratory status: spontaneous breathing, nonlabored ventilation, respiratory function stable and Camacho connected to nasal cannula oxygen Cardiovascular status: blood pressure returned to baseline and stable Postop Assessment: no apparent nausea or vomiting Anesthetic complications: no   No notable events documented.   Last Vitals:  Vitals:   06/13/22 1503 06/13/22 1509  BP:  125/73  Pulse: 93 86  Resp: 16 16  Temp:  37 C  SpO2: 100% 100%    Last Pain:  Vitals:   06/13/22 1415  TempSrc:   PainSc: 7                  Arita Miss

## 2022-06-14 ENCOUNTER — Encounter: Payer: Self-pay | Admitting: Surgery

## 2022-06-14 DIAGNOSIS — E1010 Type 1 diabetes mellitus with ketoacidosis without coma: Secondary | ICD-10-CM | POA: Diagnosis not present

## 2022-06-14 DIAGNOSIS — N179 Acute kidney failure, unspecified: Secondary | ICD-10-CM | POA: Diagnosis not present

## 2022-06-14 DIAGNOSIS — K3184 Gastroparesis: Secondary | ICD-10-CM | POA: Diagnosis not present

## 2022-06-14 DIAGNOSIS — E2740 Unspecified adrenocortical insufficiency: Secondary | ICD-10-CM | POA: Diagnosis not present

## 2022-06-14 DIAGNOSIS — E43 Unspecified severe protein-calorie malnutrition: Secondary | ICD-10-CM | POA: Diagnosis not present

## 2022-06-14 DIAGNOSIS — I951 Orthostatic hypotension: Secondary | ICD-10-CM | POA: Diagnosis not present

## 2022-06-14 DIAGNOSIS — D696 Thrombocytopenia, unspecified: Secondary | ICD-10-CM | POA: Diagnosis not present

## 2022-06-14 DIAGNOSIS — E039 Hypothyroidism, unspecified: Secondary | ICD-10-CM | POA: Diagnosis not present

## 2022-06-14 DIAGNOSIS — D638 Anemia in other chronic diseases classified elsewhere: Secondary | ICD-10-CM | POA: Diagnosis not present

## 2022-06-14 DIAGNOSIS — G9341 Metabolic encephalopathy: Secondary | ICD-10-CM | POA: Diagnosis not present

## 2022-06-14 LAB — CULTURE, BLOOD (ROUTINE X 2)
Culture: NO GROWTH
Culture: NO GROWTH
Special Requests: ADEQUATE

## 2022-06-14 LAB — PHOSPHORUS
Phosphorus: 3.4 mg/dL (ref 2.5–4.6)
Phosphorus: 4 mg/dL (ref 2.5–4.6)

## 2022-06-14 LAB — HEMOGLOBIN AND HEMATOCRIT, BLOOD
HCT: 31.3 % — ABNORMAL LOW (ref 36.0–46.0)
Hemoglobin: 11.2 g/dL — ABNORMAL LOW (ref 12.0–15.0)

## 2022-06-14 LAB — GLUCOSE, CAPILLARY
Glucose-Capillary: 104 mg/dL — ABNORMAL HIGH (ref 70–99)
Glucose-Capillary: 115 mg/dL — ABNORMAL HIGH (ref 70–99)
Glucose-Capillary: 123 mg/dL — ABNORMAL HIGH (ref 70–99)
Glucose-Capillary: 138 mg/dL — ABNORMAL HIGH (ref 70–99)
Glucose-Capillary: 188 mg/dL — ABNORMAL HIGH (ref 70–99)
Glucose-Capillary: 227 mg/dL — ABNORMAL HIGH (ref 70–99)
Glucose-Capillary: 257 mg/dL — ABNORMAL HIGH (ref 70–99)
Glucose-Capillary: 301 mg/dL — ABNORMAL HIGH (ref 70–99)
Glucose-Capillary: 57 mg/dL — ABNORMAL LOW (ref 70–99)

## 2022-06-14 LAB — CBC
HCT: 22.5 % — ABNORMAL LOW (ref 36.0–46.0)
Hemoglobin: 7.4 g/dL — ABNORMAL LOW (ref 12.0–15.0)
MCH: 30.7 pg (ref 26.0–34.0)
MCHC: 32.9 g/dL (ref 30.0–36.0)
MCV: 93.4 fL (ref 80.0–100.0)
Platelets: 67 10*3/uL — ABNORMAL LOW (ref 150–400)
RBC: 2.41 MIL/uL — ABNORMAL LOW (ref 3.87–5.11)
RDW: 13.2 % (ref 11.5–15.5)
WBC: 3.4 10*3/uL — ABNORMAL LOW (ref 4.0–10.5)
nRBC: 0 % (ref 0.0–0.2)

## 2022-06-14 LAB — BASIC METABOLIC PANEL
Anion gap: 8 (ref 5–15)
BUN: 34 mg/dL — ABNORMAL HIGH (ref 6–20)
CO2: 23 mmol/L (ref 22–32)
Calcium: 7.5 mg/dL — ABNORMAL LOW (ref 8.9–10.3)
Chloride: 109 mmol/L (ref 98–111)
Creatinine, Ser: 1.6 mg/dL — ABNORMAL HIGH (ref 0.44–1.00)
GFR, Estimated: 44 mL/min — ABNORMAL LOW (ref >60)
Glucose, Bld: 306 mg/dL — ABNORMAL HIGH (ref 70–99)
Potassium: 3.4 mmol/L — ABNORMAL LOW (ref 3.5–5.1)
Sodium: 140 mmol/L (ref 135–145)

## 2022-06-14 LAB — MAGNESIUM
Magnesium: 1.9 mg/dL (ref 1.7–2.4)
Magnesium: 1.8 mg/dL (ref 1.7–2.4)

## 2022-06-14 LAB — PREPARE RBC (CROSSMATCH)

## 2022-06-14 MED ORDER — INSULIN ASPART 100 UNIT/ML IJ SOLN: 2.0000 [IU] | Freq: Three times a day (TID) | INTRAMUSCULAR | Status: DC

## 2022-06-14 MED ORDER — PROCHLORPERAZINE EDISYLATE 10 MG/2ML IJ SOLN
10.0000 mg | Freq: Once | INTRAMUSCULAR | Status: AC
  Administered 2022-06-14: 10 mg via INTRAVENOUS
  Filled 2022-06-14: qty 2

## 2022-06-14 MED ORDER — HYDROCORTISONE SOD SUC (PF) 100 MG IJ SOLR
100.0000 mg | Freq: Three times a day (TID) | INTRAMUSCULAR | Status: DC
  Administered 2022-06-14 – 2022-06-15 (×3): 100 mg via INTRAVENOUS
  Filled 2022-06-14 (×5): qty 2

## 2022-06-14 MED ORDER — PANTOPRAZOLE SODIUM 40 MG IV SOLR
40.0000 mg | INTRAVENOUS | Status: DC
  Administered 2022-06-14 – 2022-06-20 (×7): 40 mg via INTRAVENOUS
  Filled 2022-06-14 (×7): qty 10

## 2022-06-14 MED ORDER — POTASSIUM CHLORIDE 20 MEQ PO PACK
20.0000 meq | PACK | Freq: Once | ORAL | Status: AC
  Administered 2022-06-14: 20 meq via ORAL
  Filled 2022-06-14: qty 1

## 2022-06-14 MED ORDER — SODIUM CHLORIDE 0.9 % IV BOLUS
500.0000 mL | Freq: Once | INTRAVENOUS | Status: AC
  Administered 2022-06-14: 500 mL via INTRAVENOUS

## 2022-06-14 MED ORDER — FLUDROCORTISONE ACETATE 0.1 MG PO TABS
0.1000 mg | ORAL_TABLET | ORAL | Status: AC
  Administered 2022-06-14: 0.1 mg via ORAL
  Filled 2022-06-14 (×2): qty 1

## 2022-06-14 MED ORDER — FUROSEMIDE 10 MG/ML IJ SOLN: 20.0000 mg | Freq: Once | INTRAMUSCULAR | Status: DC

## 2022-06-14 MED ORDER — SODIUM CHLORIDE 0.9% IV SOLUTION: Freq: Once | INTRAVENOUS | Status: DC

## 2022-06-14 MED ORDER — LACTATED RINGERS IV BOLUS
1000.0000 mL | Freq: Once | INTRAVENOUS | Status: AC
  Administered 2022-06-14: 1000 mL via INTRAVENOUS

## 2022-06-14 MED ORDER — INSULIN GLARGINE-YFGN 100 UNIT/ML ~~LOC~~ SOLN
4.0000 [IU] | Freq: Every day | SUBCUTANEOUS | Status: DC
  Administered 2022-06-14: 4 [IU] via SUBCUTANEOUS
  Filled 2022-06-14: qty 0.04

## 2022-06-14 MED ORDER — OSMOLITE 1.2 CAL PO LIQD
1000.0000 mL | ORAL | Status: AC
  Administered 2022-06-14 – 2022-06-16 (×4): 1000 mL via JEJUNOSTOMY

## 2022-06-14 NOTE — Progress Notes (Signed)
Progress Note   Patient: Ashley Camacho [id number]:[id number] DOB: 1990/12/08 DOA: 06/09/2022     5 DOS: the patient was seen and examined on 06/14/2022   Brief hospital course: 32 year old female brought to the emergency room for weakness, vomiting and lethargy.  She was found to be in diabetic ketoacidosis.  She also has a history of depression and stopped taking her medication secondary to cost.  Patient has a history of adrenal insufficiency and diabetes.  1/18.  Patient interested in having a place to stay to help take care of her.  Case discussed with psychiatry to see the patient in consultation.  Tapering off insulin drip to long-acting insulin. 1/19.  Patient agreeable to Dobbhoff feeding tube.  IR placed but it still in the stomach at this point.  Will need an x-ray tomorrow to confirm that it is advanced before using. 1/20.  Dobbhoff tube has not advanced through the stomach despite 3 x-rays.  Case discussed with general surgery Dr. Dahlia Byes and he will take to the operating room for a jejunostomy tube tomorrow.  Dobbhoff tube removed. 1/21.  low sugar this morning requiring a amp of D50.  Jejunostomy tube placed by Dr. Dahlia Byes. 1/22.  Blood pressure on the lower side.  Will give a fluid bolus, restart stress dose steroids and give a unit of blood for hemoglobin of 7.4.  Assessment and Plan: * Hypotension IV fluid bolus.  Transfuse unit of blood.  Stress dose steroids.  1 dose of Florinef.  Protein-calorie malnutrition, severe Jejunostomy tube placed yesterday.  Start feeding.  DKA (diabetic ketoacidosis) ([place]) Type 1 diabetes with diabetic ketoacidosis.  Sugars have been labile here despite being on Cortef.  Continue long-acting insulin 4 units at night.  Adrenal insufficiency ([place]) With hypotension today we will continue stress dose steroids today.  Acute metabolic encephalopathy Secondary to diabetic ketoacidosis.  This has improved.  Hypothyroidism TSH slightly high.  Restarted  levothyroxine at a slightly higher dose.  Thrombocytopenia ([place]) Looking back at labs this seems chronic in nature.  Hypophosphatemia Replaced on 1/22  Underweight BMI 16.58  Hypomagnesemia Replaced  Pseudohyponatremia Sodium initially low secondary to diabetic ketoacidosis.  Last sodium in the normal range.  Anemia of chronic disease Received 1 unit of packed red blood cells for hemoglobin of 7.0.  Ferritin elevated at 1511.  Current hemoglobin down to 7.4.  Will give another unit of packed red blood cells on 1/22.  Gastroparesis Diabetic gastroparesis.  Trial of Reglan and erythromycin  Depression Appreciate psychiatric consultation.  Prozac started.  Acute kidney injury superimposed on chronic kidney disease ([place]) Acute kidney injury on CKD likely stage II.  Creatinine 2.58 on presentation and down to 1.38 at its lowest point.  Up to 1.6 today.        Subjective: Patient this morning felt lightheaded her blood pressure was on the lower side.  Will restart stress dose steroids and given a unit of blood today.  Tube feeding was ordered to start last night but did not start.  Physical Exam: Vitals:   06/14/22 0500 06/14/22 0740 06/14/22 1042 06/14/22 1304  BP: (!) 96/58 (!) 75/49 98/63 108/67  Pulse: 72 79 81 78  Resp: 20 18    Temp:  97.7 F (36.5 C)  98 F (36.7 C)  TempSrc:      SpO2: 100% 100% 100% 100%  Weight:      Height:       Physical Exam HENT:     Head: Normocephalic.  Mouth/Throat:     Pharynx: No oropharyngeal exudate.  Eyes:     General: Lids are normal.     Conjunctiva/sclera: Conjunctivae normal.  Cardiovascular:     Rate and Rhythm: Normal rate and regular rhythm.     Heart sounds: Normal heart sounds, S1 normal and S2 normal.  Pulmonary:     Breath sounds: No decreased breath sounds, wheezing, rhonchi or rales.  Abdominal:     Palpations: Abdomen is soft.     Tenderness: There is no abdominal tenderness.  Musculoskeletal:      Right lower leg: No swelling.     Left lower leg: No swelling.  Skin:    General: Skin is warm.     Findings: No rash.  Neurological:     Mental Status: She is alert and oriented to person, place, and time.     Data Reviewed: Potassium 3.4, creatinine 1.6, hemoglobin 7.4, platelet count 67  Disposition: Status is: Inpatient Remains inpatient appropriate because: Postoperative day 1 for jejunostomy placement.  Blood pressure low giving a fluid bolus and restarting stress dose steroids.  Planned Discharge Destination: To be determined    Time spent: 28 minutes  Author: Loletha Grayer, MD 06/14/2022 1:56 PM  For on call review [email or web address].

## 2022-06-14 NOTE — Plan of Care (Signed)

## 2022-06-14 NOTE — Progress Notes (Signed)
Inpatient Rehab Admissions Coordinator:   Per therapy recommendation,  patient was screened for CIR candidacy by Clemens Catholic, MS, CCC-SLP  Pt. Not able to tolerate OOB today due to nausea vomiting. Will follow and re-assess once Pt has been receiving Tfs for a few days and once she's able to participate more with therapies. Please contact me any with questions.  Clemens Catholic, [place], [place] Rehab Admissions Coordinator  [phone] (office)   Clemens Catholic, [place], [place] Rehab Admissions Coordinator  [phone] (office)

## 2022-06-14 NOTE — Assessment & Plan Note (Addendum)
Jejunostomy tube placed on 1/21 by Dr. Dahlia Byes.  Started on tube feeds. --Appreciate Dietitian's recs 1/24>>29: started and titrated up tube feeds.  Started tolerating meals again, no N/V.  1/30: transitioning to nocturnal feeds, adjusting insulin accordingly --Added Remeron for appetite stimulant, mood disorder/s and sleep. --On nocturnal feeds tonight. --Requested nurses to teach patient how to manage her tube feeds and pump for d/c preparation

## 2022-06-14 NOTE — Progress Notes (Addendum)
Patient seen and examined.  Jejunostomy working.  Can transfuse due to chronic anemia, started trophic feeds Abdomen soft no peritonitis no infection May use advance feeds as tolerated We will be available F/U outpt for staple removal 2 weeks w Mr Olean Ree [facility]

## 2022-06-14 NOTE — Progress Notes (Signed)
CNA rounding on pt with b/p 66/46 HR 83, this nurse rechecked the b/p 68/48 HR 72. Patient also c/o dizziness. NP notified and order for bolus was given. Pt has had decrease out of 100 cc even after the bolus. Patient was bladder scan with the highest of 94 cc. MD was notified at the beginning of shift that GI feedings wait 24 hour after tube is placed. Patient did have applesauce and Ensure during the shift.Will continue to monitor patient.

## 2022-06-14 NOTE — Progress Notes (Addendum)
Nutrition Follow-up  DOCUMENTATION CODES:   Underweight, Severe malnutrition in context of chronic illness  INTERVENTION:   -D/c calorie count -D/c Ensure Enlive po BID, each supplement provides 350 kcal and 20 grams of protein -MVI with minerals daily  -TF via j-tube:   Osmolite 1.2 @ 20 ml/hr and increase by 10 ml every 12 hours to goal rate of 60 ml/hr.    120 ml free water flush every 6 hours   Tube feeding regimen provides 1728 kcal (100% of needs), 80 grams of protein, and 1181 ml of H2O. Total free water: 1661 ml daily   -Monitor Mg, K, and Phos and replete as needed secondary to high refeeding risk   NUTRITION DIAGNOSIS:   Severe Malnutrition related to social / environmental circumstances as evidenced by moderate muscle depletion, severe muscle depletion, moderate fat depletion, severe fat depletion, percent weight loss.  Ongoing  GOAL:   Patient will meet greater than or equal to 90% of their needs  Progressing; j-tube placed and TF started today  MONITOR:   PO intake, Supplement acceptance, Diet advancement  REASON FOR ASSESSMENT:   Consult Assessment of nutrition requirement/status, Calorie Count  ASSESSMENT:   Pt with PMH of type 1 DM, HTN, gastroparesis, anemia, and adrenal insufficiency admitted for evaluation of weakness and vomiting.  1/19- IR placed NGT 1/21- s/p open j-tube placement  Reviewed I/O's: +540 ml x 24 hours and +3.7 L since admission  UOP: 350 ml x 24 hours   Calorie count d/c on 1/21. Pt not meeting her needs orally- noted pt consuming only about 14% of estimated nutritional needs orally (intake mainly consisting of jello and milk).   On 06/11/22, RN unable to successfully place NGT at bedside. IR placed later that evening, however, did not use tube secondary to concern for tube positioning. General surgery placed open j-tube on 06/13/22 (j-tube placed due to pt's longstanding history of gastroparesis).   Spoke with pt at bedside,  who acknowledged j-tube placement yesterday. Pt still eating very little, mainly sips. Pt also complains of severe acid reflux, stating "it feels like all my stomach acid is jumping up into my chest".   Case discussed with RN, who reports STAT medications have been ordered to help with low BP and reflux. Plan to start TF this afternoon as feedings were asked to be started 24 hours after placement.   No wt loss noted since admission.   Medications reviewed and include lasix and solu-cortef.   Labs reviewed: CBGS: 57-227 (inpatient orders for glycemic control are 0-5 units insulin aspart daily at bedtime, 0-6 units insulin aspart TID with meals, 2 units insulin aspart TID with meals, and 4 units insulin glargine-yfgn daily).    Diet Order:   Diet Order             Diet heart healthy/carb modified Room service appropriate? Yes; Fluid consistency: Thin  Diet effective now                   EDUCATION NEEDS:   Education needs have been addressed  Skin:  Skin Assessment: Reviewed RN Assessment  Last BM:  Unknown  Height:   Ht Readings from Last 1 Encounters:  06/10/22 5\' 1"  (1.549 m)    Weight:   Wt Readings from Last 1 Encounters:  06/14/22 39.8 kg    Ideal Body Weight:  47.7 kg  BMI:  Body mass index is 16.58 kg/m.  Estimated Nutritional Needs:   Kcal:  1650-1850  Protein:  80-95 grams  Fluid:  > 1.6 L    Loistine Chance, RD, LDN, [place] Registered Dietitian II Certified Diabetes Care and Education Specialist Please refer to [facility] [place] for RD and/or RD on-call/weekend/after hours pager

## 2022-06-14 NOTE — Progress Notes (Signed)
Occupational Therapy Treatment Patient Details Name: MALAIKA ARNALL MRN: [id number] DOB: 09-10-1990 Today's Date: 06/14/2022   History of present illness Apphia Cropley is a 42yoF who comes to [facility] on 06/09/22 c confusion and weakness. PMH: DM1, DKA, CKD, HTN, gastroparesis, adrenal insufficiency. In ED, BP 68/34mmHg. Per chart pt has been hypersomnolent, anorexic.   OT comments  Upon entering the room, pt supine in bed with RN present in the room. Pt has been hypotensive this morning but BP was improving ( 98/58 (70)) in supine. Pt then reports, " I feel like I am going to pass out". Pt placed into trendelenburg in bed in an attempt to increase BP and bolus also being given. Max A to repositioning pt in bed and pt reports feeling very weak. After several minutes, pt returned to position with head slightly elevated and pt reports feeling some better. She grimaced during bed mobility/repositioning efforts. BP taken at this time with results of 96/57 (69) and pt follows cuing to participate in ankle pumps x 15 reps. Pt declined further efforts at this time as she just does not feel well. All needs within reach upon exiting the room.    Recommendations for follow up therapy are one component of a multi-disciplinary discharge planning process, led by the attending physician.  Recommendations may be updated based on patient status, additional functional criteria and insurance authorization.    Follow Up Recommendations  Acute inpatient rehab (3hours/day)     Assistance Recommended at Discharge Intermittent Supervision/Assistance  Patient can return home with the following  Assistance with cooking/housework;Assist for transportation;A little help with walking and/or transfers;A little help with bathing/dressing/bathroom;Help with stairs or ramp for entrance   Equipment Recommendations  Other (comment) (defer to next venue of care)       Precautions / Restrictions Precautions Precautions: Fall        Mobility Bed Mobility Overal bed mobility: Needs Assistance             General bed mobility comments: max A to reposition in bed    Transfers                   General transfer comment: not attempted secondary to nausea and dizziness         ADL either performed or assessed with clinical judgement    Extremity/Trunk Assessment Upper Extremity Assessment Upper Extremity Assessment: Generalized weakness   Lower Extremity Assessment Lower Extremity Assessment: Generalized weakness        Vision Patient Visual Report: No change from baseline            Cognition Arousal/Alertness: Awake/alert Behavior During Therapy: Flat affect Overall Cognitive Status: Within Functional Limits for tasks assessed                                                     Pertinent Vitals/ Pain       Pain Assessment Pain Assessment: Faces Faces Pain Scale: Hurts even more Pain Location: abdomen Pain Descriptors / Indicators: Aching, Discomfort, Grimacing Pain Intervention(s): Monitored during session, Premedicated before session, Repositioned         Frequency  Min 3X/week        Progress Toward Goals  OT Goals(current goals can now be found in the care plan section)  Progress towards OT goals: Progressing toward goals  Acute  Rehab OT Goals Patient Stated Goal: to get stronger OT Goal Formulation: With patient Time For Goal Achievement: 06/24/22 Potential to Achieve Goals: [place] Discharge plan remains appropriate;Frequency remains appropriate       AM-PAC OT "6 Clicks" Daily Activity     Outcome Measure   Help from another person eating meals?: None Help from another person taking care of personal grooming?: A Little Help from another person toileting, which includes using toliet, bedpan, or urinal?: A Lot Help from another person bathing (including washing, rinsing, drying)?: A Lot Help from another person to put on  and taking off regular upper body clothing?: A Little Help from another person to put on and taking off regular lower body clothing?: A Lot 6 Click Score: 16    End of Session    OT Visit Diagnosis: Unsteadiness on feet (R26.81);Repeated falls (R29.6);Muscle weakness (generalized) (M62.81)   Activity Tolerance Patient limited by fatigue   Patient Left in bed;with nursing/sitter in room   Nurse Communication          Time: 3646-8032 OT Time Calculation (min): 12 min  Charges: OT General Charges $OT Visit: 1 Visit OT Treatments $Therapeutic Activity: 8-22 mins  Darleen Crocker, MS, OTR/L , CBIS ascom [phone]  06/14/22, 12:18 PM

## 2022-06-14 NOTE — Progress Notes (Signed)
CROSS COVER NOTE  NAME: Ashley Camacho MRN: [id number] DOB : 1991-02-19 ATTENDING PHYSICIAN: Ashley Grayer, MD    Date of Service   06/14/2022   HPI/Events of Note   Notified of low BP 68/48 with patient report of dizziness earlier tonight. Ashley Camacho was hypotensive on arrival to [facility] ED 06/09/22 and was fluid responsive at that time.  Interventions   Assessment/Plan:  1L LR bolus      To reach the provider On-Call:   7AM- 7PM see care teams to locate the attending and reach out to them via [email or web address]. Password: [email or web address] 7PM-7AM contact night-coverage If you still have difficulty reaching the appropriate provider, please page the [facility] (Director on Call) for [facility] on amion for assistance  This document was prepared using Systems analyst and may include unintentional dictation errors.  Ashley Glass DNP, MBA, FNP-BC Nurse Practitioner [facility] Pager [phone]

## 2022-06-15 ENCOUNTER — Encounter: Payer: Self-pay | Admitting: Internal Medicine

## 2022-06-15 DIAGNOSIS — N179 Acute kidney failure, unspecified: Secondary | ICD-10-CM | POA: Diagnosis not present

## 2022-06-15 DIAGNOSIS — G9341 Metabolic encephalopathy: Secondary | ICD-10-CM | POA: Diagnosis not present

## 2022-06-15 DIAGNOSIS — I951 Orthostatic hypotension: Secondary | ICD-10-CM | POA: Diagnosis not present

## 2022-06-15 DIAGNOSIS — K3184 Gastroparesis: Secondary | ICD-10-CM | POA: Diagnosis not present

## 2022-06-15 DIAGNOSIS — E039 Hypothyroidism, unspecified: Secondary | ICD-10-CM | POA: Diagnosis not present

## 2022-06-15 DIAGNOSIS — D638 Anemia in other chronic diseases classified elsewhere: Secondary | ICD-10-CM | POA: Diagnosis not present

## 2022-06-15 DIAGNOSIS — D696 Thrombocytopenia, unspecified: Secondary | ICD-10-CM | POA: Diagnosis not present

## 2022-06-15 DIAGNOSIS — E43 Unspecified severe protein-calorie malnutrition: Secondary | ICD-10-CM | POA: Diagnosis not present

## 2022-06-15 DIAGNOSIS — E1010 Type 1 diabetes mellitus with ketoacidosis without coma: Secondary | ICD-10-CM | POA: Diagnosis not present

## 2022-06-15 DIAGNOSIS — E2740 Unspecified adrenocortical insufficiency: Secondary | ICD-10-CM | POA: Diagnosis not present

## 2022-06-15 LAB — GLUCOSE, CAPILLARY
Glucose-Capillary: 234 mg/dL — ABNORMAL HIGH (ref 70–99)
Glucose-Capillary: 244 mg/dL — ABNORMAL HIGH (ref 70–99)
Glucose-Capillary: 255 mg/dL — ABNORMAL HIGH (ref 70–99)
Glucose-Capillary: 256 mg/dL — ABNORMAL HIGH (ref 70–99)
Glucose-Capillary: 305 mg/dL — ABNORMAL HIGH (ref 70–99)
Glucose-Capillary: 367 mg/dL — ABNORMAL HIGH (ref 70–99)
Glucose-Capillary: 95 mg/dL (ref 70–99)

## 2022-06-15 LAB — MAGNESIUM: Magnesium: 2 mg/dL (ref 1.7–2.4)

## 2022-06-15 LAB — PHOSPHORUS: Phosphorus: 4.5 mg/dL (ref 2.5–4.6)

## 2022-06-15 MED ORDER — INSULIN ASPART 100 UNIT/ML IJ SOLN
0.0000 [IU] | INTRAMUSCULAR | Status: DC
  Administered 2022-06-15: 4 [IU] via SUBCUTANEOUS
  Administered 2022-06-15: 3 [IU] via SUBCUTANEOUS
  Administered 2022-06-15: 2 [IU] via SUBCUTANEOUS
  Administered 2022-06-16 (×2): 4 [IU] via SUBCUTANEOUS
  Administered 2022-06-16: 3 [IU] via SUBCUTANEOUS
  Administered 2022-06-16 – 2022-06-17 (×2): 2 [IU] via SUBCUTANEOUS
  Administered 2022-06-17: 1 [IU] via SUBCUTANEOUS
  Administered 2022-06-18: 2 [IU] via SUBCUTANEOUS
  Administered 2022-06-18: 4 [IU] via SUBCUTANEOUS
  Administered 2022-06-18: 2 [IU] via SUBCUTANEOUS
  Administered 2022-06-18: 1 [IU] via SUBCUTANEOUS
  Administered 2022-06-19: 3 [IU] via SUBCUTANEOUS
  Administered 2022-06-19 (×2): 4 [IU] via SUBCUTANEOUS
  Administered 2022-06-20: 1 [IU] via SUBCUTANEOUS
  Administered 2022-06-20: 5 [IU] via SUBCUTANEOUS
  Administered 2022-06-20: 2 [IU] via SUBCUTANEOUS
  Filled 2022-06-15 (×16): qty 1

## 2022-06-15 MED ORDER — HYDROCORTISONE SOD SUC (PF) 100 MG IJ SOLR
50.0000 mg | Freq: Two times a day (BID) | INTRAMUSCULAR | Status: DC
  Administered 2022-06-15: 50 mg via INTRAVENOUS
  Filled 2022-06-15: qty 1

## 2022-06-15 MED ORDER — INSULIN GLARGINE-YFGN 100 UNIT/ML ~~LOC~~ SOLN
5.0000 [IU] | Freq: Every day | SUBCUTANEOUS | Status: DC
  Administered 2022-06-15 – 2022-06-27 (×13): 5 [IU] via SUBCUTANEOUS
  Filled 2022-06-15 (×14): qty 0.05

## 2022-06-15 MED ORDER — HYDROCORTISONE SOD SUC (PF) 100 MG IJ SOLR
25.0000 mg | Freq: Two times a day (BID) | INTRAMUSCULAR | Status: AC
  Administered 2022-06-15: 25 mg via INTRAVENOUS
  Filled 2022-06-15: qty 0.5

## 2022-06-15 MED ORDER — HYDROCORTISONE 10 MG PO TABS
10.0000 mg | ORAL_TABLET | Freq: Every day | ORAL | Status: DC
  Administered 2022-06-16 – 2022-07-08 (×23): 10 mg via ORAL
  Filled 2022-06-15 (×25): qty 1

## 2022-06-15 MED ORDER — INSULIN ASPART 100 UNIT/ML IJ SOLN
2.0000 [IU] | INTRAMUSCULAR | Status: DC
  Administered 2022-06-15 – 2022-06-16 (×5): 2 [IU] via SUBCUTANEOUS
  Filled 2022-06-15 (×2): qty 1

## 2022-06-15 MED ORDER — HYDROCORTISONE 10 MG PO TABS
20.0000 mg | ORAL_TABLET | Freq: Every day | ORAL | Status: DC
  Administered 2022-06-16 – 2022-07-09 (×24): 20 mg via ORAL
  Filled 2022-06-15 (×24): qty 2

## 2022-06-15 NOTE — Progress Notes (Signed)
Patient alert and oriented, tube feeding increased by 31ml per order, no any symptoms of nausea and vomiting.

## 2022-06-15 NOTE — Progress Notes (Signed)
Occupational Therapy Treatment Patient Details Name: Ashley Camacho MRN: [id number] DOB: 1990/06/06 Today's Date: 06/15/2022   History of present illness Ashley Camacho is a 47yoF who comes to [facility] on 06/09/22 c confusion and weakness. PMH: DM1, DKA, CKD, HTN, gastroparesis, adrenal insufficiency. In ED, BP 68/72mmHg. Per chart pt has been hypersomnolent, anorexic.   OT comments  Upon entering the room, pt supine in bed with no c/o pain and pt is agreeable to OT intervention. Pt requesting to return to bathroom first. Pt performed bed mobility at mod I level and ambulated to bathroom with supervision but no physical assistance. Pt able to manage toilet transfer and hygiene without physical assistance. Pt takes seated rest break on EOB and then ambulates 200' in hallway with supervision overall. Pt returning to bed with call bell and all needed items within reach. Recommendation changed to [place] secondary to pt's progress.    Recommendations for follow up therapy are one component of a multi-disciplinary discharge planning process, led by the attending physician.  Recommendations may be updated based on patient status, additional functional criteria and insurance authorization.    Follow Up Recommendations  Home health OT     Assistance Recommended at Discharge Intermittent Supervision/Assistance  Patient can return home with the following  Assistance with cooking/housework;Assist for transportation;A little help with walking and/or transfers;A little help with bathing/dressing/bathroom;Help with stairs or ramp for entrance   Equipment Recommendations  Other (comment) (defer to next venue of care)       Precautions / Restrictions Precautions Precautions: Fall Restrictions Weight Bearing Restrictions: No       Mobility Bed Mobility Overal bed mobility: Modified Independent Bed Mobility: Supine to Sit, Sit to Supine           General bed mobility comments: increased time, no  physical assistance required    Transfers Overall transfer level: Needs assistance Equipment used: None Transfers: Sit to/from Stand Sit to Stand: Modified independent (Device/Increase time)                 Balance Overall balance assessment: Needs assistance Sitting-balance support: Feet supported Sitting balance-Leahy Scale: Good     Standing balance support: No upper extremity supported, During functional activity Standing balance-Leahy Scale: Fair                             ADL either performed or assessed with clinical judgement   ADL Overall ADL's : Needs assistance/impaired     Grooming: Supervision/safety                   Toilet Transfer: Supervision/safety   Writer and Hygiene: Supervision/safety              Extremity/Trunk Assessment Upper Extremity Assessment Upper Extremity Assessment: Generalized weakness   Lower Extremity Assessment Lower Extremity Assessment: Generalized weakness        Vision Patient Visual Report: No change from baseline            Cognition Arousal/Alertness: Awake/alert Behavior During Therapy: Flat affect Overall Cognitive Status: Within Functional Limits for tasks assessed                                                General Comments patient had one bowel incontinence episode with stiting up. multiple liquid stools  during session    Pertinent Vitals/ Pain       Pain Assessment Pain Assessment: No/denies pain         Frequency  Min 3X/week        Progress Toward Goals  OT Goals(current goals can now be found in the care plan section)  Progress towards OT goals: Progressing toward goals  Acute Rehab OT Goals Patient Stated Goal: to get stronger OT Goal Formulation: With patient Time For Goal Achievement: 06/24/22 Potential to Achieve Goals: [place] Discharge plan needs to be updated;Frequency needs to be updated        AM-PAC OT "6 Clicks" Daily Activity     Outcome Measure   Help from another person eating meals?: None Help from another person taking care of personal grooming?: None Help from another person toileting, which includes using toliet, bedpan, or urinal?: None Help from another person bathing (including washing, rinsing, drying)?: None Help from another person to put on and taking off regular upper body clothing?: None Help from another person to put on and taking off regular lower body clothing?: None 6 Click Score: 24    End of Session    OT Visit Diagnosis: Unsteadiness on feet (R26.81);Repeated falls (R29.6);Muscle weakness (generalized) (M62.81)   Activity Tolerance Patient tolerated treatment well   Patient Left in bed;with call bell/phone within reach   Nurse Communication          Time: 0093-8182 OT Time Calculation (min): 28 min  Charges: OT General Charges $OT Visit: 1 Visit OT Treatments $Self Care/Home Management : 8-22 mins $Therapeutic Activity: 8-22 mins  Darleen Crocker, MS, OTR/L , CBIS ascom [phone]  06/15/22, 3:04 PM

## 2022-06-15 NOTE — Inpatient Diabetes Management (Signed)
Inpatient Diabetes Program Recommendations  AACE/ADA: New Consensus Statement on Inpatient Glycemic Control (2015)  Target Ranges:  Prepandial:   less than 140 mg/dL      Peak postprandial:   less than 180 mg/dL (1-2 hours)      Critically ill patients:  140 - 180 mg/dL   Lab Results  Component Value Date   GLUCAP 256 (H) 06/15/2022   HGBA1C 10.2 (H) 02/28/2022    Review of Glycemic Control  Diabetes history:  DM1/gastroparesis Outpatient Diabetes medications:  Lantus 6 units QD Novolog SSI Current orders for Inpatient glycemic control: Semglee 5 units, Novolog 0-6 q 4 hrs., Solucortef 50 mg q 12 hrs.  Inpatient Diabetes Program Recommendations:   Please consider: -Novolog 2 units q 4 hrs. Tube feed coverage (hold if tube feed held or stopped for any reason)  Thank you, Bethena Roys E. Antonina Deziel, RN, MSN, CDE  Diabetes Coordinator Inpatient Glycemic Control Team Team Pager [phone] (8am-5pm) 06/15/2022 2:58 PM

## 2022-06-15 NOTE — Progress Notes (Signed)
Patient alert and oriented, tube feeding increased by 34ml per order, nausea nad vomiting x1, PRN PO phenergan given with improvement, pain controlled via PRN pain meds. Will continue to monitor.

## 2022-06-15 NOTE — Progress Notes (Signed)
Physical Therapy Treatment Patient Details Name: Ashley Camacho MRN: [id number] DOB: 07-13-1990 Today's Date: 06/15/2022   History of Present Illness Ashley Camacho is a 75yoF who comes to [facility] on 06/09/22 c confusion and weakness. PMH: DM1, DKA, CKD, HTN, gastroparesis, adrenal insufficiency. In ED, BP 68/60mmHg. Per chart pt has been hypersomnolent, anorexic.    PT Comments    The patient has increased activity tolerance and independence this session. No dizziness is reported with upright activity today. She was Modified independent with bed mobility and transfers. Supervision provided with ambulation in room with activity tolerance limited by diarrhea and fatigue with activity. Recommend to continue PT to maximize independence. Anticipate patient may be able to return home with continued improvement in functional independence and intermittent family support.   Recommendations for follow up therapy are one component of a multi-disciplinary discharge planning process, led by the attending physician.  Recommendations may be updated based on patient status, additional functional criteria and insurance authorization.  Follow Up Recommendations  Home health PT     Assistance Recommended at Discharge Intermittent Supervision/Assistance  Patient can return home with the following Assist for transportation;Help with stairs or ramp for entrance;Assistance with cooking/housework   Equipment Recommendations  None recommended by PT    Recommendations for Other Services       Precautions / Restrictions Precautions Precautions: Fall Restrictions Weight Bearing Restrictions: No     Mobility  Bed Mobility Overal bed mobility: Needs Assistance Bed Mobility: Supine to Sit     Supine to sit: Modified independent (Device/Increase time), HOB elevated     General bed mobility comments: increased time, no physical assistance required    Transfers Overall transfer level: Needs  assistance Equipment used: None Transfers: Sit to/from Stand Sit to Stand: Modified independent (Device/Increase time)           General transfer comment: for standing from the bed and from the toilet x 2 times. no safety concerns with transfers    Ambulation/Gait Ambulation/Gait assistance: Supervision Gait Distance (Feet): 50 Feet Assistive device: None Gait Pattern/deviations: Step-through pattern, Decreased stride length Gait velocity: decreased     General Gait Details: slow but steady gait without loss of balance, occasional cues for safety. no dizziness reported with ambulation. further ambulation self limited due to bouts of diarrhea. the patient did request to ambulate again later in the hallway.   Stairs             Wheelchair Mobility    Modified Rankin (Stroke Patients Only)       Balance Overall balance assessment: Needs assistance Sitting-balance support: Feet supported Sitting balance-Leahy Scale: Good     Standing balance support: No upper extremity supported, During functional activity Standing balance-Leahy Scale: Fair Standing balance comment: no external support is provided from therapist                            Cognition Arousal/Alertness: Awake/alert Behavior During Therapy: Flat affect Overall Cognitive Status: Within Functional Limits for tasks assessed                                          Exercises      General Comments General comments (skin integrity, edema, etc.): patient had one bowel incontinence episode with stiting up. multiple liquid stools during session      Pertinent Vitals/Pain Pain  Assessment Pain Assessment: No/denies pain    Home Living                          Prior Function            PT Goals (current goals can now be found in the care plan section) Acute Rehab PT Goals Patient Stated Goal: to get up and walk PT Goal Formulation: With patient Time For  Goal Achievement: 06/24/22 Potential to Achieve Goals: Fair Progress towards PT goals: Progressing toward goals    Frequency    Min 2X/week      PT Plan Discharge plan needs to be updated    Co-evaluation              AM-PAC PT "6 Clicks" Mobility   Outcome Measure  Help needed turning from your back to your side while in a flat bed without using bedrails?: None Help needed moving from lying on your back to sitting on the side of a flat bed without using bedrails?: None Help needed moving to and from a bed to a chair (including a wheelchair)?: None Help needed standing up from a chair using your arms (e.g., wheelchair or bedside chair)?: None Help needed to walk in hospital room?: A Little Help needed climbing 3-5 steps with a railing? : A Little 6 Click Score: 22    End of Session   Activity Tolerance: Patient tolerated treatment well Patient left:  (seated on bed per patient request with nurse present in room) Nurse Communication: Mobility status PT Visit Diagnosis: Difficulty in walking, not elsewhere classified (R26.2);Other abnormalities of gait and mobility (R26.89);Repeated falls (R29.6);Adult, failure to thrive (R62.7);Dizziness and giddiness (R42);Other symptoms and signs involving the nervous system (R29.898)     Time: 4166-0630 PT Time Calculation (min) (ACUTE ONLY): 26 min  Charges:  $Therapeutic Activity: 23-37 mins                    Minna Merritts, PT, MPT    Percell Locus 06/15/2022, 2:14 PM

## 2022-06-15 NOTE — Progress Notes (Addendum)
Brief Nutrition Follow-Up Note  Case discussed with RN. Pt with recurrent hypoglycemia (250-300's). Pt has not yet reached goal rate of TF (currently at 30 ml/hr, goal rate of 60 ml/hr).   Pt with type 1 DM and will likely need additional insulin and meal coverage to optimize glycemic control with TF. Current DM regimen 0-6 units insulin aspart every 4 hours and 5 units insulin glargine-yfgn daily).  RD paged DM coordinator for assistance with insulin recommendations and TF coverage.   Loistine Chance, RD, LDN, [place] Registered Dietitian II Certified Diabetes Care and Education Specialist Please refer to [employer] for RD and/or RD on-call/weekend/after hours pager

## 2022-06-15 NOTE — Progress Notes (Signed)
Progress Note   Patient: Ashley Camacho XBM:[id number] DOB: 27-Sep-1990 DOA: 06/09/2022     6 DOS: the patient was seen and examined on 06/15/2022   Brief hospital course: 32 year old female brought to the emergency room for weakness, vomiting and lethargy.  She was found to be in diabetic ketoacidosis.  She also has a history of depression and stopped taking her medication secondary to cost.  Patient has a history of adrenal insufficiency and diabetes.  1/18.  Patient interested in having a place to stay to help take care of her.  Case discussed with psychiatry to see the patient in consultation.  Tapering off insulin drip to long-acting insulin. 1/19.  Patient agreeable to Dobbhoff feeding tube.  IR placed but it still in the stomach at this point.  Will need an x-ray tomorrow to confirm that it is advanced before using. 1/20.  Dobbhoff tube has not advanced through the stomach despite 3 x-rays.  Case discussed with general surgery Dr. Dahlia Byes and he will take to the operating room for a jejunostomy tube tomorrow.  Dobbhoff tube removed. 1/21.  low sugar this morning requiring a amp of D50.  Jejunostomy tube placed by Dr. Dahlia Byes. 1/22.  Blood pressure on the lower side.  Will give a fluid bolus, restart stress dose steroids and give a unit of blood for hemoglobin of 7.4.  Hemoglobin after transfusion went up to 11.2 which is probably too high for 1 unit of blood. 1/23.  Patient feeling a little bit better.  Will taper stress dose steroids and hopefully switch over to Cortef for tomorrow.  Patient did have some intermittent vomiting yesterday.  Up to 30 mL an hour with tube feeds.  Assessment and Plan: * Hypotension Improved with stress dose steroids, blood transfusion and fluids.  Taper stress dose steroids.  Protein-calorie malnutrition, severe Jejunostomy tube placed on 1/21 by Dr. Dahlia Byes.  Continue to try to titrate tube feeding up to goal.  DKA (diabetic ketoacidosis) ([place]) Type 1 diabetes  with diabetic ketoacidosis.  Sugars have been labile here.  Decrease stress dose steroids and switch to cortef tomorrow.  Continue long-acting insulin at night and use short acting insulin every 4 hours plus sliding scale.  Adrenal insufficiency (H[facility]) Taper stress dose steroids today and switch over to Cortef for tomorrow.  Acute metabolic encephalopathy Secondary to diabetic ketoacidosis.  This has improved.  Hypothyroidism TSH slightly high.  Restarted levothyroxine at a slightly higher dose.  Thrombocytopenia ([place]) Looking back at labs this seems chronic in nature.  Hypophosphatemia Replaced on 1/22  Underweight BMI 17.83  Hypomagnesemia Replaced  Pseudohyponatremia Sodium initially low secondary to diabetic ketoacidosis.  Last sodium in the normal range.  Anemia of chronic disease Received 1 unit of packed red blood cells for hemoglobin of 7.0.  Ferritin elevated at 1511.  Current hemoglobin down to 7.4.  Will give another unit of packed red blood cells on 1/22.  Last hemoglobin 11.2.  Gastroparesis Diabetic gastroparesis.  Trial of Reglan and erythromycin (limit erythromycin to 2 weeks)  Depression Appreciate psychiatric consultation.  Prozac started.  Acute kidney injury superimposed on chronic kidney disease ([place]) Acute kidney injury on CKD likely stage II.  Creatinine 2.58 on presentation and down to 1.38 at its lowest point.  Last creatinine 1.6        Subjective: Patient had a vomiting episode last night.  On tube feeding.  Initially admitted with DKA.  Had hypotension after surgery requiring stress dose steroids.  Physical Exam: Vitals:  06/15/22 0514 06/15/22 0622 06/15/22 0740 06/15/22 1753  BP: 133/72  135/80 (!) 145/96  Pulse: 81  86 87  Resp: 16  18 18   Temp: 98.7 F (37.1 C)  98 F (36.7 C) 97.9 F (36.6 C)  TempSrc:      SpO2: 100%  100% 100%  Weight:  42.8 kg    Height:       Physical Exam HENT:     Head: Normocephalic.      Mouth/Throat:     Pharynx: No oropharyngeal exudate.  Eyes:     General: Lids are normal.     Conjunctiva/sclera: Conjunctivae normal.  Cardiovascular:     Rate and Rhythm: Normal rate and regular rhythm.     Heart sounds: Normal heart sounds, S1 normal and S2 normal.  Pulmonary:     Breath sounds: No decreased breath sounds, wheezing, rhonchi or rales.  Abdominal:     Palpations: Abdomen is soft.     Tenderness: There is no abdominal tenderness.  Musculoskeletal:     Right lower leg: No swelling.     Left lower leg: No swelling.  Skin:    General: Skin is warm.     Findings: No rash.  Neurological:     Mental Status: She is alert and oriented to person, place, and time.     Data Reviewed: Phosphorus 4.5, magnesium 2.0  Disposition: Status is: Inpatient Remains inpatient appropriate because: Continuing tube feeding today.  Will decrease steroids IV today and switch over to Cortef for tomorrow.  Planned Discharge Destination: To be determined    Time spent: 28 minutes  Author: Loletha Grayer, MD 06/15/2022 6:21 PM  For on call review [email or web address].

## 2022-06-16 DIAGNOSIS — N189 Chronic kidney disease, unspecified: Secondary | ICD-10-CM | POA: Diagnosis not present

## 2022-06-16 DIAGNOSIS — E43 Unspecified severe protein-calorie malnutrition: Secondary | ICD-10-CM | POA: Diagnosis not present

## 2022-06-16 DIAGNOSIS — E2740 Unspecified adrenocortical insufficiency: Secondary | ICD-10-CM | POA: Diagnosis not present

## 2022-06-16 DIAGNOSIS — K3184 Gastroparesis: Secondary | ICD-10-CM | POA: Diagnosis not present

## 2022-06-16 DIAGNOSIS — E10649 Type 1 diabetes mellitus with hypoglycemia without coma: Secondary | ICD-10-CM | POA: Diagnosis not present

## 2022-06-16 DIAGNOSIS — N179 Acute kidney failure, unspecified: Secondary | ICD-10-CM | POA: Diagnosis not present

## 2022-06-16 DIAGNOSIS — I5042 Chronic combined systolic (congestive) and diastolic (congestive) heart failure: Secondary | ICD-10-CM | POA: Diagnosis not present

## 2022-06-16 DIAGNOSIS — D638 Anemia in other chronic diseases classified elsewhere: Secondary | ICD-10-CM | POA: Diagnosis not present

## 2022-06-16 LAB — COMPREHENSIVE METABOLIC PANEL
ALT: 8 U/L (ref 0–44)
AST: 18 U/L (ref 15–41)
Albumin: 2.9 g/dL — ABNORMAL LOW (ref 3.5–5.0)
Alkaline Phosphatase: 58 U/L (ref 38–126)
Anion gap: 10 (ref 5–15)
BUN: 40 mg/dL — ABNORMAL HIGH (ref 6–20)
CO2: 25 mmol/L (ref 22–32)
Calcium: 7.8 mg/dL — ABNORMAL LOW (ref 8.9–10.3)
Chloride: 106 mmol/L (ref 98–111)
Creatinine, Ser: 1.55 mg/dL — ABNORMAL HIGH (ref 0.44–1.00)
GFR, Estimated: 46 mL/min — ABNORMAL LOW (ref >60)
Glucose, Bld: 354 mg/dL — ABNORMAL HIGH (ref 70–99)
Potassium: 3.7 mmol/L (ref 3.5–5.1)
Sodium: 141 mmol/L (ref 135–145)
Total Bilirubin: 0.6 mg/dL (ref 0.3–1.2)
Total Protein: 5.6 g/dL — ABNORMAL LOW (ref 6.5–8.1)

## 2022-06-16 LAB — CBC
HCT: 31.3 % — ABNORMAL LOW (ref 36.0–46.0)
Hemoglobin: 10.8 g/dL — ABNORMAL LOW (ref 12.0–15.0)
MCH: 31.2 pg (ref 26.0–34.0)
MCHC: 34.5 g/dL (ref 30.0–36.0)
MCV: 90.5 fL (ref 80.0–100.0)
Platelets: 129 10*3/uL — ABNORMAL LOW (ref 150–400)
RBC: 3.46 MIL/uL — ABNORMAL LOW (ref 3.87–5.11)
RDW: 12.7 % (ref 11.5–15.5)
WBC: 6.3 10*3/uL (ref 4.0–10.5)
nRBC: 0 % (ref 0.0–0.2)

## 2022-06-16 LAB — TYPE AND SCREEN
ABO/RH(D): A POS
Antibody Screen: NEGATIVE
Unit division: 0
Unit division: 0

## 2022-06-16 LAB — PREPARE RBC (CROSSMATCH)

## 2022-06-16 LAB — GLUCOSE, CAPILLARY
Glucose-Capillary: 334 mg/dL — ABNORMAL HIGH (ref 70–99)
Glucose-Capillary: 311 mg/dL — ABNORMAL HIGH (ref 70–99)
Glucose-Capillary: 215 mg/dL — ABNORMAL HIGH (ref 70–99)
Glucose-Capillary: 149 mg/dL — ABNORMAL HIGH (ref 70–99)
Glucose-Capillary: 275 mg/dL — ABNORMAL HIGH (ref 70–99)

## 2022-06-16 LAB — PHOSPHORUS: Phosphorus: 3.6 mg/dL (ref 2.5–4.6)

## 2022-06-16 LAB — BPAM RBC
Blood Product Expiration Date: 202402212359
Blood Product Expiration Date: 202402212359
ISSUE DATE / TIME: 202401221417
Unit Type and Rh: 6200
Unit Type and Rh: 6200

## 2022-06-16 LAB — MAGNESIUM: Magnesium: 2.3 mg/dL (ref 1.7–2.4)

## 2022-06-16 MED ORDER — OXYCODONE HCL 5 MG PO TABS
5.0000 mg | ORAL_TABLET | ORAL | Status: DC | PRN
  Administered 2022-06-17: 5 mg via ORAL
  Filled 2022-06-16 (×2): qty 1

## 2022-06-16 MED ORDER — MORPHINE SULFATE (PF) 2 MG/ML IV SOLN
2.0000 mg | Freq: Once | INTRAVENOUS | Status: AC | PRN
  Administered 2022-06-16: 2 mg via INTRAVENOUS
  Filled 2022-06-16: qty 1

## 2022-06-16 MED ORDER — INSULIN ASPART 100 UNIT/ML IJ SOLN
3.0000 [IU] | INTRAMUSCULAR | Status: DC
  Administered 2022-06-16 – 2022-06-17 (×4): 3 [IU] via SUBCUTANEOUS
  Filled 2022-06-16 (×4): qty 1

## 2022-06-16 NOTE — Progress Notes (Addendum)
Progress Note   Patient: Ashley Camacho YQI:[id number] DOB: Jan 28, 1991 DOA: 06/09/2022     7 DOS: the patient was seen and examined on 06/16/2022   Brief hospital course: 32 year old female brought to the emergency room for weakness, vomiting and lethargy.  She was found to be in diabetic ketoacidosis.  She also has a history of depression and stopped taking her medication secondary to cost.  Patient has a history of adrenal insufficiency and diabetes.  1/18.  Patient interested in having a place to stay to help take care of her.  Case discussed with psychiatry to see the patient in consultation.  Tapering off insulin drip to long-acting insulin. 1/19.  Patient agreeable to Dobbhoff feeding tube.  IR placed but it still in the stomach at this point.  Will need an x-ray tomorrow to confirm that it is advanced before using. 1/20.  Dobbhoff tube has not advanced through the stomach despite 3 x-rays.  Case discussed with general surgery Dr. Dahlia Byes and he will take to the operating room for a jejunostomy tube tomorrow.  Dobbhoff tube removed. 1/21.  low sugar this morning requiring a amp of D50.  Jejunostomy tube placed by Dr. Dahlia Byes. 1/22.  Blood pressure on the lower side.  Will give a fluid bolus, restart stress dose steroids and give a unit of blood for hemoglobin of 7.4.  Hemoglobin after transfusion went up to 11.2 which is probably too high for 1 unit of blood. 1/23.  Patient feeling a little bit better.  Will taper stress dose steroids and hopefully switch over to Cortef for tomorrow.  Patient did have some intermittent vomiting yesterday.  Up to 30 mL an hour with tube feeds.  1/24: pt reports feeling miserable with recurrent N/V and abdominal discomfort.  Tube feeds just increased 40 >> 50 cc/hr.  Request RN reduce rate to see if she feels better.  Assessment and Plan: * Hypotension Improved with stress dose steroids, blood transfusion and fluids.  Tapered off stress dose steroids.  Resumed on  Cortef. Monitor BP.  Protein-calorie malnutrition, severe Jejunostomy tube placed on 1/21 by Dr. Dahlia Byes.  Continue to try to titrate tube feeding up to goal.   1/24: pt reports increased abdominal pain and N/V with feed rate increased.  Trial today back rate off to 30 cc/hr and monitor for improvement.   DKA (diabetic ketoacidosis) ([place]) Type 1 diabetes with DKA on admission.  Sugars have been labile since admission (range from 95-334 past 24 hours). Continue long-acting insulin at night and use short acting insulin every 4 hours plus sliding scale.  Increased Novolog from 2 >> 3 units q4h today.    Adrenal insufficiency ([place]) Initially required stress dose steroids.  Transitioned to PO Cortef today 1/24.  Acute metabolic encephalopathy Resolved. Secondary to diabetic ketoacidosis.    Hypothyroidism TSH slightly high.  Restarted levothyroxine at a slightly higher dose.  Thrombocytopenia (HCC) Appears chronic.  Monitor CBC. Platelets 129k.  Hypophosphatemia Replaced on 1/22  Underweight BMI 17.83 History of eating disorder as well. Now with J tube, attempting to optimize nutrition status, but limited by recurrent abdominal pain and N/V.  Hypomagnesemia Replaced  Pseudohyponatremia Sodium initially low secondary to diabetic ketoacidosis.  Last sodium in the normal range.  Major depressive disorder, recurrent episode, moderate (HCC) Prozac started.  Appreciate psychiatry input.  Anemia of chronic disease Status post transfusion of 2 units pRBC's  (on admission, and on 1/22) earlier this admission.  Ferritin elevated at 1511.   Hbg today  10.8 Monitor CBC   Gastroparesis Diabetic gastroparesis.  Continue trial of Reglan and erythromycin (limit erythromycin to 2 weeks)  Depression Appreciate psychiatric consultation.  Prozac started.  Acute kidney injury superimposed on chronic kidney disease ([place]) Acute kidney injury on CKD likely stage II.  Creatinine 2.58 on  presentation and down to 1.38 at its lowest point.   Cr today 1.55         Subjective: Patient reports being miserable today.  She has diffuse abdominal discomfort and reports increased / recurrent nausea/vomiting overnight and this morning.  No other acute complaints.  States "I can't go on like this any more."   Physical Exam: Vitals:   06/15/22 1958 06/16/22 0326 06/16/22 0425 06/16/22 0805  BP: (!) 140/79  (!) 156/83 139/82  Pulse: 74  79 81  Resp: 18  20 18   Temp: 97.9 F (36.6 C)  98 F (36.7 C) 97.8 F (36.6 C)  TempSrc:   Oral   SpO2: 100%  99% 100%  Weight:  43.2 kg    Height:       General exam: awake, alert, no acute distress, frail and chronically ill-appearing, underweight HEENT: poor dentition, moist mucus membranes, hearing grossly normal  Respiratory system: CTAB, no wheezes, rales or rhonchi, normal respiratory effort. Cardiovascular system: normal S1/S2, RRR, no JVD, murmurs, rubs, gallops, no pedal edema.   Gastrointestinal system: soft, NT, ND, hypoactive bowel sounds.  J tube present with continuous tube feeds running at 50 cc/hr Central nervous system: A&O x 3. no gross focal neurologic deficits, normal speech Extremities: moves all , no edema, normal tone Skin: dry, intact, normal temperature Psychiatry: anxious mood, congruent affect      Data Reviewed:  Notable labs -- glucose 354 (CBG's 95 >> 334 >> 311). Cr 1.55 Ca 7.8 / albumin 2.9 Hbg 10.8 Platelets 129k  Disposition: Status is: Inpatient Remains inpatient appropriate because: Continuing tube feeding today.  Will decrease steroids IV today and switch over to Cortef for tomorrow.  Planned Discharge Destination: To be determined    Time spent: 36 minutes  Author: Ezekiel Slocumb, DO 06/16/2022 1:28 PM  For on call review [email or web address].

## 2022-06-16 NOTE — Assessment & Plan Note (Addendum)
Seen by psychiatry. They started Prozac. Adding Remeron at bedtime to help with mood, sleep and appetite stimulation.

## 2022-06-16 NOTE — Progress Notes (Signed)
Patient complained of feeling "full" and "uncomfortable" in abd area to rounding MD. Tube feed to be titrated back down to 33ml/hr per MD request.

## 2022-06-16 NOTE — Progress Notes (Signed)
Physical Therapy Treatment Patient Details Name: Ashley Camacho MRN: [id number] DOB: 04-23-1991 Today's Date: 06/16/2022   History of Present Illness Ashley Camacho is a 37yoF who [facility] on [facility]09[facility]22 c confusion and weakness. PMH: DM1, DKA, CKD, HTN, gastroparesis, adrenal insufficiency. In ED, BP 68/23mmHg. Per chart pt has been hypersomnolent, anorexic.   PT Comments    Patient with improved activity tolerance this session. She is able to stand from multiple surfaces Mod I, no physical assistance required. No dizziness or nausea is reported with activity, however patient is fatigued after hallway ambulation. She walked 262ft without device and occasionally prefers to hold on to the hallway railing. Recommend to continue PT to maximize independence and decrease caregiver burden. Anticipate patient can return home with family support.    Recommendations for follow up therapy are one component of a multi-disciplinary discharge planning process, led by the attending physician.  Recommendations may be updated based on patient status, additional functional criteria and insurance authorization.  Follow Up Recommendations  Home health PT     Assistance Recommended at Discharge Intermittent Supervision/Assistance  Patient can return home with the following Assist for transportation;Help with stairs or ramp for entrance;Assistance with cooking/housework   Equipment Recommendations  None recommended by PT    Recommendations for Other Services       Precautions / Restrictions Precautions Precautions: Fall Restrictions Weight Bearing Restrictions: No     Mobility  Bed Mobility               General bed mobility comments: not addressed as patient sitting up on arrival and post session    Transfers Overall transfer level: Needs assistance Equipment used: None Transfers: Sit to/from Stand Sit to Stand: Modified independent (Device/Increase time)           General  transfer comment: multiple standing bouts performed, from bed and from recliner chair. no physical assistance required and no dizziness reported with upright activity    Ambulation/Gait Ambulation/Gait assistance: Supervision Gait Distance (Feet): 200 Feet Assistive device: None (occasional use of hallway railing with fatigue) Gait Pattern/deviations: Step-through pattern Gait velocity: decreased     General Gait Details: patient occasionally used hallway railing for safety with fatigue but has no loss of balance without assistive device. cues for upright standing posture. patient is fatigued after walking and requires a short seated rest break on the bed.   Stairs             Wheelchair Mobility    Modified Rankin (Stroke Patients Only)       Balance Overall balance assessment: Needs assistance Sitting-balance support: Feet supported Sitting balance-Leahy Scale: Good     Standing balance support: No upper extremity supported, During functional activity Standing balance-Leahy Scale: Fair                              Cognition Arousal/Alertness: Awake/alert Behavior During Therapy: Flat affect Overall Cognitive Status: Within Functional Limits for tasks assessed                                 General Comments: patient seated in recliner, lights on, interactive and no complaints of dizziness today        Exercises      General Comments        Pertinent Vitals/Pain Pain Assessment Pain Assessment: No/denies pain    Home Living  Prior Function            PT Goals (current goals can now be found in the care plan section) Acute Rehab PT Goals Patient Stated Goal: to be able to eat more without feeling sick PT Goal Formulation: With patient Time For Goal Achievement: 06/24/22 Potential to Achieve Goals: Fair Progress towards PT goals: Progressing toward goals    Frequency    Min  2X/week      PT Plan Current plan remains appropriate    Co-evaluation              AM-PAC PT "6 Clicks" Mobility   Outcome Measure  Help needed turning from your back to your side while in a flat bed without using bedrails?: None Help needed moving from lying on your back to sitting on the side of a flat bed without using bedrails?: None Help needed moving to and from a bed to a chair (including a wheelchair)?: None Help needed standing up from a chair using your arms (e.g., wheelchair or bedside chair)?: None Help needed to walk in hospital room?: A Little Help needed climbing 3-5 steps with a railing? : A Little 6 Click Score: 22    End of Session   Activity Tolerance: Patient tolerated treatment well Patient left: in chair;with call bell/phone within reach Nurse Communication: Mobility status PT Visit Diagnosis: Difficulty in walking, not elsewhere classified (R26.2);Other abnormalities of gait and mobility (R26.89);Repeated falls (R29.6);Adult, failure to thrive (R62.7);Dizziness and giddiness (R42);Other symptoms and signs involving the nervous system (R29.898)     Time: 9371-6967 PT Time Calculation (min) (ACUTE ONLY): 25 min  Charges:  $Therapeutic Activity: 23-37 mins                    Minna Merritts, PT, MPT    Percell Locus 06/16/2022, 11:25 AM

## 2022-06-16 NOTE — Progress Notes (Signed)
Nutrition Follow-up  DOCUMENTATION CODES:   Underweight, Severe malnutrition in context of chronic illness  INTERVENTION:   -MVI with minerals daily  -TF via j-tube:    Osmolite 1.2 @ 50 ml/hr and increase by 10 ml every 12 hours to goal rate of 60 ml/hr.    120 ml free water flush every 6 hours   Tube feeding regimen provides 1728 kcal (100% of needs), 80 grams of protein, and 1181 ml of H2O. Total free water: 1661 ml daily   -Monitor Mg, K, and Phos and replete as needed secondary to high refeeding risk   NUTRITION DIAGNOSIS:   Severe Malnutrition related to social / environmental circumstances as evidenced by moderate muscle depletion, severe muscle depletion, moderate fat depletion, severe fat depletion, percent weight loss.  Ongoing  GOAL:   Patient will meet greater than or equal to 90% of their needs  Progressing   MONITOR:   PO intake, Supplement acceptance, Diet advancement  REASON FOR ASSESSMENT:   Consult Assessment of nutrition requirement/status, Calorie Count  ASSESSMENT:   Pt with PMH of type 1 DM, HTN, gastroparesis, anemia, and adrenal insufficiency admitted for evaluation of weakness and vomiting.  1/19- IR placed NGT 1/21- s/p open j-tube placement  Reviewed I/O's: +1 L x 24 hours and +5.4 L since admission  UOP: 200 ml x 24 hours   Osmolite 1.5 currently infusing at 50 ml/hr via j-tube.   Pt sitting up in chair at time of visit, with improved mood and affect. She reports she just showered and fixed her hair today. Pt shares that she is still eating minimally- noted pt consumed a few bites of oatmeal, a bite of applesauce, and two bites of toast this morning.   Pt reports that she has had episodes of vomiting, usually at night. Pt shares she feels uncomfortable and usually vomiting about 50% of the volume of ane emesis bag, but feels much better after this. She does not associate this with eating, as she often only eats in the morning and  episodes have typically been at night. She continues to complain of acid reflux when eating.   Case discussed with RN; reports concern regarding persistent hyperglycemia. Pt with type 1 DM and will require additional insulin and TF coverage. Case discussed with DM coordinator; recommendations initiated yesterday per MD. As TF rate has been increased, pt will need more insulin coverage. Per discussion with DM coordinator, discussed with pharmacist regarding new insulin needs.   Reviewed wts; noted pt has gained weight since admission, which is favorable given malnutrition.   Medications reviewed and include reglan  Labs reviewed: CBGS: 215-334 (inpatient orders for glycemic control are 0-6 units insulin aspart every 4 hours, 3 units insulin aspart every 4 hours, and 5 units insulin glargine-yfgn daily at bedtime).    Diet Order:   Diet Order             Diet heart healthy/carb modified Room service appropriate? Yes; Fluid consistency: Thin  Diet effective now                   EDUCATION NEEDS:   Education needs have been addressed  Skin:  Skin Assessment: Skin Integrity Issues: Skin Integrity Issues:: Incisions Incisions: closed abdomen s/p j-tube placement  Last BM:  06/16/22  Height:   Ht Readings from Last 1 Encounters:  06/10/22 5\' 1"  (1.549 m)    Weight:   Wt Readings from Last 1 Encounters:  06/16/22 43.2 kg  Ideal Body Weight:  47.7 kg  BMI:  Body mass index is 18 kg/m.  Estimated Nutritional Needs:   Kcal:  1650-1850  Protein:  80-95 grams  Fluid:  > 1.6 L    Loistine Chance, RD, LDN, [place] Registered Dietitian II Certified Diabetes Care and Education Specialist Please refer to [facility] for RD and/or RD on-call/weekend/after hours pager

## 2022-06-16 NOTE — Inpatient Diabetes Management (Signed)
Inpatient Diabetes Program Recommendations  AACE/ADA: New Consensus Statement on Inpatient Glycemic Control (2015)  Target Ranges:  Prepandial:   less than 140 mg/dL      Peak postprandial:   less than 180 mg/dL (1-2 hours)      Critically ill patients:  140 - 180 mg/dL   Lab Results  Component Value Date   GLUCAP 311 (H) 06/16/2022   HGBA1C 10.2 (H) 02/28/2022    Latest Reference Range & Units 06/16/22 04:28 06/16/22 07:35  Glucose-Capillary 70 - 99 mg/dL 334 (H) 311 (H)  (H): Data is abnormally high  Inpatient Diabetes Program Recommendations:   Noted decrease in steroids Please consider if CBGs continue >180: -Increase Novolog to 3 units q 4 hrs. Tube feed coverage (hold if tube feed held or stopped for any reason)  Thank you, Ashley Roys E. Bailey Faiella, RN, MSN, CDE  Diabetes Coordinator Inpatient Glycemic Control Team Team Pager [phone] (8am-5pm) 06/16/2022 11:56 AM

## 2022-06-17 ENCOUNTER — Encounter: Payer: Self-pay | Admitting: Internal Medicine

## 2022-06-17 ENCOUNTER — Inpatient Hospital Stay: Payer: Medicaid Other

## 2022-06-17 DIAGNOSIS — I5042 Chronic combined systolic (congestive) and diastolic (congestive) heart failure: Secondary | ICD-10-CM | POA: Diagnosis not present

## 2022-06-17 DIAGNOSIS — N179 Acute kidney failure, unspecified: Secondary | ICD-10-CM | POA: Diagnosis not present

## 2022-06-17 DIAGNOSIS — N189 Chronic kidney disease, unspecified: Secondary | ICD-10-CM | POA: Diagnosis not present

## 2022-06-17 DIAGNOSIS — K3184 Gastroparesis: Secondary | ICD-10-CM | POA: Diagnosis not present

## 2022-06-17 DIAGNOSIS — D638 Anemia in other chronic diseases classified elsewhere: Secondary | ICD-10-CM | POA: Diagnosis not present

## 2022-06-17 DIAGNOSIS — E10649 Type 1 diabetes mellitus with hypoglycemia without coma: Secondary | ICD-10-CM | POA: Diagnosis not present

## 2022-06-17 DIAGNOSIS — E2740 Unspecified adrenocortical insufficiency: Secondary | ICD-10-CM | POA: Diagnosis not present

## 2022-06-17 DIAGNOSIS — E43 Unspecified severe protein-calorie malnutrition: Secondary | ICD-10-CM | POA: Diagnosis not present

## 2022-06-17 LAB — GLUCOSE, CAPILLARY
Glucose-Capillary: 142 mg/dL — ABNORMAL HIGH (ref 70–99)
Glucose-Capillary: 145 mg/dL — ABNORMAL HIGH (ref 70–99)
Glucose-Capillary: 175 mg/dL — ABNORMAL HIGH (ref 70–99)
Glucose-Capillary: 206 mg/dL — ABNORMAL HIGH (ref 70–99)
Glucose-Capillary: 60 mg/dL — ABNORMAL LOW (ref 70–99)
Glucose-Capillary: 70 mg/dL (ref 70–99)
Glucose-Capillary: 70 mg/dL (ref 70–99)
Glucose-Capillary: 72 mg/dL (ref 70–99)
Glucose-Capillary: 92 mg/dL (ref 70–99)

## 2022-06-17 LAB — BASIC METABOLIC PANEL
Anion gap: 7 (ref 5–15)
BUN: 41 mg/dL — ABNORMAL HIGH (ref 6–20)
CO2: 29 mmol/L (ref 22–32)
Calcium: 8.1 mg/dL — ABNORMAL LOW (ref 8.9–10.3)
Chloride: 107 mmol/L (ref 98–111)
Creatinine, Ser: 1.3 mg/dL — ABNORMAL HIGH (ref 0.44–1.00)
GFR, Estimated: 56 mL/min — ABNORMAL LOW (ref >60)
Glucose, Bld: 81 mg/dL (ref 70–99)
Potassium: 3.6 mmol/L (ref 3.5–5.1)
Sodium: 143 mmol/L (ref 135–145)

## 2022-06-17 LAB — MAGNESIUM: Magnesium: 2.4 mg/dL (ref 1.7–2.4)

## 2022-06-17 LAB — PHOSPHORUS: Phosphorus: 2.9 mg/dL (ref 2.5–4.6)

## 2022-06-17 LAB — HEMOCHROMATOSIS DNA-PCR(C282Y,H63D)

## 2022-06-17 MED ORDER — INSULIN ASPART 100 UNIT/ML IJ SOLN
2.0000 [IU] | Freq: Four times a day (QID) | INTRAMUSCULAR | Status: DC
  Administered 2022-06-17 – 2022-06-20 (×10): 2 [IU] via SUBCUTANEOUS
  Filled 2022-06-17 (×10): qty 1

## 2022-06-17 MED ORDER — METOCLOPRAMIDE HCL 5 MG/ML IJ SOLN: 10.0000 mg | Freq: Four times a day (QID) | INTRAMUSCULAR | Status: DC

## 2022-06-17 MED ORDER — STERILE WATER FOR INJECTION IJ SOLN
INTRAMUSCULAR | Status: AC
  Administered 2022-06-17: 10 mL via INTRAMUSCULAR
  Filled 2022-06-17: qty 10

## 2022-06-17 MED ORDER — FUROSEMIDE 10 MG/ML IJ SOLN
20.0000 mg | Freq: Once | INTRAMUSCULAR | Status: AC
  Administered 2022-06-17: 20 mg via INTRAVENOUS
  Filled 2022-06-17: qty 2

## 2022-06-17 MED ORDER — ERYTHROMYCIN ETHYLSUCCINATE 200 MG/5ML PO SUSR
100.0000 mg | Freq: Three times a day (TID) | ORAL | Status: DC
  Administered 2022-06-17 – 2022-07-02 (×45): 100 mg via ORAL
  Filled 2022-06-17 (×47): qty 2.5

## 2022-06-17 MED ORDER — INSULIN ASPART 100 UNIT/ML IJ SOLN: 2.0000 [IU] | INTRAMUSCULAR | Status: DC

## 2022-06-17 NOTE — Assessment & Plan Note (Addendum)
Last echo 02/27/2021 -- EF 50-55% and normal diastolic parameters, apparently prior EF as low as 30%. --Monitor volume status with strict I/O's and daily weights --Given IV Lasix 20 mg intermittently, last on 2/9  --Further diuresis as needed, assess daily Pt reports increasing edema, but Cr is up after last Lasix given.  Monitor for now.

## 2022-06-17 NOTE — TOC Progression Note (Signed)
Transition of Care [facility]) - Progression Note    Patient Details  Name: Ashley Camacho MRN: [id number] Date of Birth: [birth year]  Transition of [facility] CM/SW Contact  Gerilyn Pilgrim, LCSW Phone Number: 06/17/2022, 4:49 PM  Clinical Narrative:   SW spoke with Joy regarding pt case. She would like [place] to send over pt records. She states she will be out of the office until Tuesday but plans to see the pt on Tuesday. CSW to send records.     Expected Discharge Plan:  (TBD) Barriers to Discharge: Continued Medical Work up  Expected Discharge Plan and Services In-house Referral: Development worker, community, Nutrition Discharge Planning Services: CM Consult   Living arrangements for the past 2 months: Single Family Home                                       Social Determinants of Health (SDOH) Interventions SDOH Screenings   Food Insecurity: No Food Insecurity (06/10/2022)  Housing: Low Risk  (06/10/2022)  Transportation Needs: No Transportation Needs (06/10/2022)  Utilities: Not At Risk (06/10/2022)  Depression (PHQ2-9): Low Risk  (11/11/2020)  Tobacco Use: Low Risk  (06/17/2022)    Readmission Risk Interventions    11/03/2021   10:37 AM 06/16/2021   12:30 PM 06/03/2021    4:26 PM  Readmission Risk Prevention Plan  Transportation Screening Complete Complete Complete  Medication Review Press photographer) Complete Complete Complete  PCP or Specialist appointment within 3-5 days of discharge Complete Complete Complete  HRI or [place] Not Complete  Not Complete  HRI or Home Care Consult Pt Refusal Comments NA  NA  SW Recovery Care/Counseling Consult Not Complete Complete Complete  SW Consult Not Complete Comments NA    Palliative Care Screening Not Applicable Not Applicable Not [place] Not Applicable Not Applicable Not Applicable

## 2022-06-17 NOTE — TOC Progression Note (Signed)
Transition of Care [facility]) - Progression Note    Patient Details  Name: Ashley Camacho MRN: [id number] Date of Birth: 1991-03-17  Transition of [facility]) CM/SW Contact  Gerilyn Pilgrim, LCSW Phone Number: 06/17/2022, 4:11 PM  Clinical Narrative:   LVM with [name] [phone] with DSS.     Expected Discharge Plan:  (TBD) Barriers to Discharge: Continued Medical Work up  Expected Discharge Plan and Services In-house Referral: Development worker, community, Nutrition Discharge Planning Services: CM Consult   Living arrangements for the past 2 months: Single Family Home                                       Social Determinants of Health (SDOH) Interventions SDOH Screenings   Food Insecurity: No Food Insecurity (06/10/2022)  Housing: Low Risk  (06/10/2022)  Transportation Needs: No Transportation Needs (06/10/2022)  Utilities: Not At Risk (06/10/2022)  Depression (PHQ2-9): Low Risk  (11/11/2020)  Tobacco Use: Low Risk  (06/17/2022)    Readmission Risk Interventions    11/03/2021   10:37 AM 06/16/2021   12:30 PM 06/03/2021    4:26 PM  Readmission Risk Prevention Plan  Transportation Screening Complete Complete Complete  Medication Review Press photographer) Complete Complete Complete  PCP or Specialist appointment within 3-5 days of discharge Complete Complete Complete  HRI or [place] Not Complete  Not Complete  HRI or Home Care Consult Pt Refusal Comments NA  NA  SW Recovery Care/Counseling Consult Not Complete Complete Complete  SW Consult Not Complete Comments NA    Palliative Care Screening Not Applicable Not Applicable Not [place] Not Applicable Not Applicable Not Applicable

## 2022-06-17 NOTE — Inpatient Diabetes Management (Signed)
Inpatient Diabetes Program Recommendations  AACE/ADA: New Consensus Statement on Inpatient Glycemic Control (2015)  Target Ranges:  Prepandial:   less than 140 mg/dL      Peak postprandial:   less than 180 mg/dL (1-2 hours)      Critically ill patients:  140 - 180 mg/dL   Lab Results  Component Value Date   GLUCAP 145 (H) 06/17/2022   HGBA1C 10.2 (H) 02/28/2022    Latest Reference Range & Units 06/16/22 07:35 06/16/22 12:00 06/16/22 15:52 06/16/22 20:58 06/17/22 00:24 06/17/22 03:39 06/17/22 04:23 06/17/22 07:32  Glucose-Capillary 70 - 99 mg/dL 311 (H) 215 (H) 149 (H) 275 (H) 142 (H) 70 72 145 (H)  (H): Data is abnormally high  Inpatient Diabetes Program Recommendations:   CBGs improved with steroids decreased. Tube feed titrated down to 40 cc/hr last hs. Please consider: -Decrease tube feed coverage to 2 units q 4 hrs. (Hold if tube feed held or stopped)  Thank you, Nani Gasser. Deidrea Gaetz, RN, MSN, CDE  Diabetes Coordinator Inpatient Glycemic Control Team Team Pager [phone] (8am-5pm) 06/17/2022 9:14 AM

## 2022-06-17 NOTE — Progress Notes (Signed)
Occupational Therapy Treatment Patient Details Name: Ashley Camacho MRN: [id number] DOB: [birth year] Today's Date: 06/17/2022   History of present illness Ashley Camacho is a 74yoF who comes to [facility] on 06/09/22 c confusion and weakness. PMH: DM1, DKA, CKD, HTN, gastroparesis, adrenal insufficiency. In ED, BP 68/47mmHg. Per chart pt has been hypersomnolent, anorexic.   OT comments  Upon entering the room, pt supine in bed with smile and her face and reports, " I was able to eat some today". Pt reports feeling better over the last few days. Pt performs bed mobility without physical assistance and dons B socks. Pt declines toileting needs and asks to ambulate. Pt ambulates while pushing IV pole without physical assistance. Pt with no reports of dizziness but does report tightness in abdomen. Pt returning to room and stands at sink for grooming tasks without physical assistance. Pt has met OT goals and does not need further skilled acute OT intervention. Pt is asking about inpatient psych stay but OT is unsure of teams plans in this direction. OT to complete orders at this time.    Recommendations for follow up therapy are one component of a multi-disciplinary discharge planning process, led by the attending physician.  Recommendations may be updated based on patient status, additional functional criteria and insurance authorization.    Follow Up Recommendations  No OT follow up     Assistance Recommended at Discharge None     Equipment Recommendations  None recommended by OT       Precautions / Restrictions Precautions Precautions: Fall Restrictions Weight Bearing Restrictions: No       Mobility Bed Mobility Overal bed mobility: Modified Independent                  Transfers Overall transfer level: Modified independent                       Balance Overall balance assessment: Needs assistance Sitting-balance support: Feet supported Sitting balance-Leahy Scale:  Good     Standing balance support: No upper extremity supported, During functional activity Standing balance-Leahy Scale: Good                             ADL either performed or assessed with clinical judgement   ADL Overall ADL's : Modified independent                                             Vision Patient Visual Report: No change from baseline            Cognition Arousal/Alertness: Awake/alert Behavior During Therapy: WFL for tasks assessed/performed Overall Cognitive Status: Within Functional Limits for tasks assessed                                 General Comments: patient seated in recliner, lights on, interactive and no complaints of dizziness today                   Pertinent Vitals/ Pain       Pain Assessment Pain Assessment: Faces Faces Pain Scale: Hurts a little bit Pain Location: abdomen Pain Descriptors / Indicators: Aching, Discomfort Pain Intervention(s): Monitored during session, Limited activity within patient's tolerance, Repositioned      Progress  Toward Goals  OT Goals(current goals can now be found in the care plan section)  Progress towards OT goals: Goals met/education completed, patient discharged from OT  Acute Rehab OT Goals Patient Stated Goal: to go to inpt program OT Goal Formulation: With patient Time For Goal Achievement: 06/24/22 Potential to Achieve Goals: [place] All goals met and education completed, patient discharged from [facility] OT "6 Clicks" Daily Activity     Outcome Measure   Help from another person eating meals?: None Help from another person taking care of personal grooming?: None Help from another person toileting, which includes using toliet, bedpan, or urinal?: None Help from another person bathing (including washing, rinsing, drying)?: None Help from another person to put on and taking off regular upper body clothing?: None Help from  another person to put on and taking off regular lower body clothing?: None 6 Click Score: 24       Activity Tolerance Patient tolerated treatment well   Patient Left with call bell/phone within reach;in chair           Time: 0948-1000 OT Time Calculation (min): 12 min  Charges: OT General Charges $OT Visit: 1 Visit OT Treatments $Therapeutic Activity: 8-22 mins  Darleen Crocker, MS, OTR/L , CBIS ascom [phone]  06/17/22, 10:53 AM

## 2022-06-17 NOTE — Progress Notes (Addendum)
Progress Note   Patient: Ashley Camacho KKX:[id number] DOB: 09/08/1990 DOA: 06/09/2022     8 DOS: the patient was seen and examined on 06/17/2022   Brief hospital course: 32 year old female brought to the emergency room for weakness, vomiting and lethargy.  She was found to be in diabetic ketoacidosis.  She also has a history of depression and stopped taking her medication secondary to cost.  Patient has a history of adrenal insufficiency and diabetes.  1/18.  Patient interested in having a place to stay to help take care of her.  Case discussed with psychiatry to see the patient in consultation.  Tapering off insulin drip to long-acting insulin. 1/19.  Patient agreeable to Dobbhoff feeding tube.  IR placed but it still in the stomach at this point.  Will need an x-ray tomorrow to confirm that it is advanced before using. 1/20.  Dobbhoff tube has not advanced through the stomach despite 3 x-rays.  Case discussed with general surgery Dr. Dahlia Byes and he will take to the operating room for a jejunostomy tube tomorrow.  Dobbhoff tube removed. 1/21.  low sugar this morning requiring a amp of D50.  Jejunostomy tube placed by Dr. Dahlia Byes. 1/22.  Blood pressure on the lower side.  Will give a fluid bolus, restart stress dose steroids and give a unit of blood for hemoglobin of 7.4.  Hemoglobin after transfusion went up to 11.2 which is probably too high for 1 unit of blood. 1/23.  Patient feeling a little bit better.  Will taper stress dose steroids and hopefully switch over to Cortef for tomorrow.  Patient did have some intermittent vomiting yesterday.  Up to 30 mL an hour with tube feeds.  1/24: pt reports feeling miserable with recurrent N/V and abdominal discomfort.  Tube feeds just increased 40 >> 50 cc/hr.  Request RN reduce rate to see if she feels better.  Assessment and Plan: * Hypotension Improved with stress dose steroids, blood transfusion and fluids.  Tapered off stress dose steroids.  Resumed on  Cortef. Monitor BP.  DKA (diabetic ketoacidosis) ([place]) Type 1 diabetes with DKA on admission.  Sugars have been labile since admission (range from 95-334 past 24 hours). Continue long-acting insulin at night and use short acting insulin every 4 hours plus sliding scale.  Decrease Novolog back to 2 units q4h given sugar 70 this AM.    Gastroparesis Diabetic gastroparesis.  Continue trial of Reglan and erythromycin (limit erythromycin to 2 weeks). GI recommendations from 1/18-19 reviewed.   She has persistent N/V and abdominal pain on tube feeds. --Discussed with GI, appreciate input --Stop Reglan (hx of this not working)  --Increase erythromycin to 100 mg TID --Abdominal xray to assess tip of J tube since emesis looks like tube feeds but should be post-pyloric  Acute kidney injury superimposed on chronic kidney disease ([place]) Acute kidney injury on CKD likely stage II.  Creatinine 2.58 on presentation and down to 1.38 at its lowest point.   Cr today 1.69>>6.78  Acute metabolic encephalopathy-resolved as of 06/17/2022 Resolved. Secondary to diabetic ketoacidosis.    Protein-calorie malnutrition, severe Jejunostomy tube placed on 1/21 by Dr. Dahlia Byes.  Continue to try to titrate tube feeding up to goal.   1/24: pt reports increased abdominal pain and N/V with feed rate increased.  Trial today back rate off to 30 cc/hr and monitor for improvement.   Hypophosphatemia Replaced on 1/22  Hypomagnesemia Replaced  Adrenal insufficiency (HCC) Initially required stress dose steroids.  Transitioned to PO Cortef  on 1/24.  Thrombocytopenia (HCC) Appears chronic.  Monitor CBC. Platelets 129k.  Chronic combined systolic and diastolic CHF (congestive heart failure) ([place]) Last echo 02/27/2021 -- EF 50-55% and normal diastolic parameters, apparently prior EF as low as 30%. --Monitor volume status with strict I/O's and daily weights --IV Lasix 20 mg x 1 today --Further diuresis as needed, assess  daily  Pseudohyponatremia Sodium initially low secondary to diabetic ketoacidosis.  Last sodium in the normal range.  Major depressive disorder, recurrent episode, moderate (HCC) Prozac started.  Appreciate psychiatry input.  Hypothyroidism TSH slightly high.  Restarted levothyroxine at a slightly higher dose.  Depression Appreciate psychiatric consultation.  Prozac started.  Underweight BMI 17.83 History of eating disorder as well. Now with J tube, attempting to optimize nutrition status, but limited by recurrent abdominal pain and N/V.  Anemia of chronic disease Status post transfusion of 2 units pRBC's  (on admission, and on 1/22) earlier this admission.  Ferritin elevated at 1511.   Hbg today 10.8 Monitor CBC          Subjective: Patient reports ongoing N/V overnight and this AM, has filled multiple emesis bags completely full.  She felt very slightly better with tube feed rate reduced from 50 to 40 cc/hr yesterday.  She gets severe abdominal pain just before vomiting and typically it eases up some after vomiting.  She asks what we can do for this pain. Discussed adverse effects of opiates on digestive motility and inability to use NSAID's with renal function.   She also reports feeling swollen in her low back and hips with sitting in the bed and hx of CHF.   Physical Exam: Vitals:   06/16/22 2056 06/17/22 0339 06/17/22 0500 06/17/22 0829  BP: (!) 141/77 126/78  127/73  Pulse: 86 79  80  Resp: 17 17  14   Temp: 98.6 F (37 C) 98.4 F (36.9 C)  98.3 F (36.8 C)  TempSrc: Oral Oral  Oral  SpO2: 100% 100%  99%  Weight:   42.5 kg   Height:       General exam: awake, alert, no acute distress, frail and chronically ill-appearing, underweight HEENT: poor dentition, moist mucus membranes, hearing grossly normal  Respiratory system: CTAB, no wheezes, rales or rhonchi, normal respiratory effort. Cardiovascular system: RRR, no pedal edema.   Gastrointestinal system:  soft, NT, ND, hypoactive bowel sounds.  J tube present with continuous tube feeds running at 50 cc/hr Central nervous system: A&O x 3. no gross focal neurologic deficits, normal speech Extremities: moves all , no edema, normal tone Skin: dry, intact, normal temperature, no visible swelling or erythema around J tube site Psychiatry: anxious mood, congruent affect      Data Reviewed:  Notable labs -- glucose 145 >> 60 >> 70 >> 92.  BMP with Cr improved 1.30 from 1.55, BUN 41  Disposition: Status is: Inpatient Remains inpatient appropriate because: Continuing tube feeding today.  Will decrease steroids IV today and switch over to Cortef for tomorrow.  Planned Discharge Destination: To be determined    Time spent: 38 minutes  Author: Ezekiel Slocumb, DO 06/17/2022 2:16 PM  For on call review [email or web address].

## 2022-06-18 DIAGNOSIS — I5042 Chronic combined systolic (congestive) and diastolic (congestive) heart failure: Secondary | ICD-10-CM | POA: Diagnosis not present

## 2022-06-18 DIAGNOSIS — E43 Unspecified severe protein-calorie malnutrition: Secondary | ICD-10-CM | POA: Diagnosis not present

## 2022-06-18 DIAGNOSIS — E10649 Type 1 diabetes mellitus with hypoglycemia without coma: Secondary | ICD-10-CM | POA: Diagnosis not present

## 2022-06-18 DIAGNOSIS — D638 Anemia in other chronic diseases classified elsewhere: Secondary | ICD-10-CM | POA: Diagnosis not present

## 2022-06-18 DIAGNOSIS — K3184 Gastroparesis: Secondary | ICD-10-CM | POA: Diagnosis not present

## 2022-06-18 DIAGNOSIS — N179 Acute kidney failure, unspecified: Secondary | ICD-10-CM | POA: Diagnosis not present

## 2022-06-18 DIAGNOSIS — E2740 Unspecified adrenocortical insufficiency: Secondary | ICD-10-CM | POA: Diagnosis not present

## 2022-06-18 DIAGNOSIS — N189 Chronic kidney disease, unspecified: Secondary | ICD-10-CM | POA: Diagnosis not present

## 2022-06-18 LAB — BASIC METABOLIC PANEL
Anion gap: 7 (ref 5–15)
BUN: 40 mg/dL — ABNORMAL HIGH (ref 6–20)
CO2: 27 mmol/L (ref 22–32)
Calcium: 7.9 mg/dL — ABNORMAL LOW (ref 8.9–10.3)
Chloride: 105 mmol/L (ref 98–111)
Creatinine, Ser: 1.15 mg/dL — ABNORMAL HIGH (ref 0.44–1.00)
GFR, Estimated: >60 mL/min (ref >60)
Glucose, Bld: 114 mg/dL — ABNORMAL HIGH (ref 70–99)
Potassium: 4.7 mmol/L (ref 3.5–5.1)
Sodium: 139 mmol/L (ref 135–145)

## 2022-06-18 LAB — GLUCOSE, CAPILLARY
Glucose-Capillary: 102 mg/dL — ABNORMAL HIGH (ref 70–99)
Glucose-Capillary: 137 mg/dL — ABNORMAL HIGH (ref 70–99)
Glucose-Capillary: 178 mg/dL — ABNORMAL HIGH (ref 70–99)
Glucose-Capillary: 188 mg/dL — ABNORMAL HIGH (ref 70–99)
Glucose-Capillary: 196 mg/dL — ABNORMAL HIGH (ref 70–99)
Glucose-Capillary: 217 mg/dL — ABNORMAL HIGH (ref 70–99)
Glucose-Capillary: 231 mg/dL — ABNORMAL HIGH (ref 70–99)
Glucose-Capillary: 334 mg/dL — ABNORMAL HIGH (ref 70–99)

## 2022-06-18 LAB — CBC
HCT: 28.9 % — ABNORMAL LOW (ref 36.0–46.0)
Hemoglobin: 9.7 g/dL — ABNORMAL LOW (ref 12.0–15.0)
MCH: 30.8 pg (ref 26.0–34.0)
MCHC: 33.6 g/dL (ref 30.0–36.0)
MCV: 91.7 fL (ref 80.0–100.0)
Platelets: 148 10*3/uL — ABNORMAL LOW (ref 150–400)
RBC: 3.15 MIL/uL — ABNORMAL LOW (ref 3.87–5.11)
RDW: 12.5 % (ref 11.5–15.5)
WBC: 4.1 10*3/uL (ref 4.0–10.5)
nRBC: 0 % (ref 0.0–0.2)

## 2022-06-18 LAB — PHOSPHORUS: Phosphorus: 2.1 mg/dL — ABNORMAL LOW (ref 2.5–4.6)

## 2022-06-18 LAB — MAGNESIUM: Magnesium: 2.3 mg/dL (ref 1.7–2.4)

## 2022-06-18 MED ORDER — POTASSIUM & SODIUM PHOSPHATES 280-160-250 MG PO PACK
1.0000 | PACK | Freq: Three times a day (TID) | ORAL | Status: AC
  Administered 2022-06-18 (×4): 1
  Filled 2022-06-18 (×5): qty 1

## 2022-06-18 MED ORDER — STERILE WATER FOR INJECTION IJ SOLN
INTRAMUSCULAR | Status: AC
  Administered 2022-06-18: 10 mL via INTRAVENOUS
  Filled 2022-06-18: qty 10

## 2022-06-18 MED ORDER — OSMOLITE 1.5 CAL PO LIQD
1000.0000 mL | ORAL | Status: DC
  Administered 2022-06-18 – 2022-06-20 (×4): 1000 mL

## 2022-06-18 MED ORDER — FUROSEMIDE 10 MG/ML IJ SOLN
20.0000 mg | Freq: Once | INTRAMUSCULAR | Status: AC
  Administered 2022-06-18: 20 mg via INTRAVENOUS
  Filled 2022-06-18: qty 2

## 2022-06-18 MED ORDER — FREE WATER
120.0000 mL | Status: DC
  Administered 2022-06-18 – 2022-06-21 (×17): 120 mL

## 2022-06-18 NOTE — Progress Notes (Signed)
Nutrition Follow-up  DOCUMENTATION CODES:   Underweight, Severe malnutrition in context of chronic illness  INTERVENTION:   -D/c Osmolite 1.2 -TF via j-tube:  Osmolite 1.5 @ 30 ml/hr and increase by 10 ml every 12 hours to goal rate of 45 ml/hr.   60 ml Prosource TF daily.  120 ml  free water flush every 4 hours  Tube feeding regimen provides 1700 kcal (100% of needs), 88 grams of protein, and 891 ml of H2O. Total free water: 1611 ml daily   NUTRITION DIAGNOSIS:   Severe Malnutrition related to social / environmental circumstances as evidenced by moderate muscle depletion, severe muscle depletion, moderate fat depletion, severe fat depletion, percent weight loss.  Ongoing  GOAL:   Patient will meet greater than or equal to 90% of their needs  Progressing   MONITOR:   PO intake, Supplement acceptance, Diet advancement  REASON FOR ASSESSMENT:   Consult Assessment of nutrition requirement/status, Calorie Count  ASSESSMENT:   Pt with PMH of type 1 DM, HTN, gastroparesis, anemia, and adrenal insufficiency admitted for evaluation of weakness and vomiting.  1/19- IR placed NGT 1/21- s/p open j-tube placement  Reviewed I/O's: 24 hours and +9.7 L since admission   Pt walking in hallways with therapy at time of visit. Noted TF rate infusion at 30 ml/hr.   Case discussed with RN and MD. TF rate was decreased starting 06/16/22 secondary to pt's complaint of fullness. Pt with vomiting yesterday and filled up multiple emesis bags. KUB was ordered to ensure appropriate placement of j-tube; x-ray reveals that jejunostomy catheter tip at lt lower quadrant. Per MD, likely no increase in feeding rate today. Discussed changing formula to a more concentrated formula to see if pt able to tolerate goal rate with less volume. Changing formula will also reduce volume of free water flushes. MD amenable to this plan.   Noted wt with steady increase. This is favorable given pt's malnutrition,  however, suspicious of how much is true weight gain related to edema for CHF. Switching TF to a more concentrated formula may also assist with management of fluid status.   Medications reviewed and include lasix.   Labs reviewed: CBGS: 102-196 (inpatient orders for glycemic control are 0-6 units insulin aspart eeyr 4 hours, 2 units insulin aspart every 6 hours, and 5 units insulin glargine-yfgn daily).    Diet Order:   Diet Order             Diet heart healthy/carb modified Room service appropriate? Yes; Fluid consistency: Thin  Diet effective now                   EDUCATION NEEDS:   Education needs have been addressed  Skin:  Skin Assessment: Skin Integrity Issues: Skin Integrity Issues:: Incisions Incisions: closed abdomen s/p j-tube placement  Last BM:  06/17/22  Height:   Ht Readings from Last 1 Encounters:  06/10/22 5\' 1"  (1.549 m)    Weight:   Wt Readings from Last 1 Encounters:  06/18/22 44.7 kg    Ideal Body Weight:  47.7 kg  BMI:  Body mass index is 18.62 kg/m.  Estimated Nutritional Needs:   Kcal:  1650-1850  Protein:  80-95 grams  Fluid:  > 1.6 L    Loistine Chance, RD, LDN, [place] Registered Dietitian II Certified Diabetes Care and Education Specialist Please refer to [facility] for RD and/or RD on-call/weekend/after hours pager

## 2022-06-18 NOTE — Progress Notes (Signed)
Physical Therapy Treatment Patient Details Name: MASAKO OVERALL MRN: [id number] DOB: April 16, 1991 Today's Date: 06/18/2022   History of Present Illness Payeton Germani is a 39yoF who comes to [facility] on 06/09/22 c confusion and weakness. PMH: DM1, DKA, CKD, HTN, gastroparesis, adrenal insufficiency. In ED, BP 68/33mmHg. Per chart pt has been hypersomnolent, anorexic.   PT Comments    Patient is agreeable to PT. She reports eating breakfast this morning. The patient has met goals adequate for discharge from acute PT services. She is independent with bed mobility, transfers and is ambulating in hallway without assistive device with Mod I (extra time) with no dizziness with activity. Encouraged routine hallway ambulation to maintain strength in preparation for discharge. Recommend ambulation with mobility team at this time. No further acute PT needs identified.    Recommendations for follow up therapy are one component of a multi-disciplinary discharge planning process, led by the attending physician.  Recommendations may be updated based on patient status, additional functional criteria and insurance authorization.  Follow Up Recommendations  No PT follow up     Assistance Recommended at Discharge PRN  Patient can return home with the following Help with stairs or ramp for entrance;Assist for transportation   Equipment Recommendations  None recommended by PT    Recommendations for Other Services       Precautions / Restrictions Precautions Precautions: Fall Restrictions Weight Bearing Restrictions: No     Mobility  Bed Mobility Overal bed mobility: Independent                  Transfers Overall transfer level: Independent                      Ambulation/Gait Ambulation/Gait assistance: Modified independent (Device/Increase time) Gait Distance (Feet): 200 Feet Assistive device: None Gait Pattern/deviations: Step-through pattern Gait velocity: decreased      General Gait Details: no loss of balance with hallway ambulation witout assistive device. no shortness of breath is noted with activity. encouraged patient to ambulate with assistance routinely in the hall daily as able to maintain strength   Stairs             Wheelchair Mobility    Modified Rankin (Stroke Patients Only)       Balance Overall balance assessment: Modified Independent                                          Cognition Arousal/Alertness: Awake/alert Behavior During Therapy: WFL for tasks assessed/performed Overall Cognitive Status: Within Functional Limits for tasks assessed                                          Exercises      General Comments        Pertinent Vitals/Pain Pain Assessment Pain Assessment: Faces Faces Pain Scale: Hurts a little bit Pain Location: abdomen Pain Descriptors / Indicators: Tightness Pain Intervention(s): Limited activity within patient's tolerance, Monitored during session    Home Living                          Prior Function            PT Goals (current goals can now be found in the care plan  section) Acute Rehab PT Goals Patient Stated Goal: to be able to eat more without feeling sick PT Goal Formulation: All assessment and education complete, DC therapy Progress towards PT goals: Goals met/education completed, patient discharged from PT    Frequency    Min 2X/week      PT Plan      Co-evaluation              AM-PAC PT "6 Clicks" Mobility   Outcome Measure  Help needed turning from your back to your side while in a flat bed without using bedrails?: None Help needed moving from lying on your back to sitting on the side of a flat bed without using bedrails?: None Help needed moving to and from a bed to a chair (including a wheelchair)?: None Help needed standing up from a chair using your arms (e.g., wheelchair or bedside chair)?: None Help  needed to walk in hospital room?: None Help needed climbing 3-5 steps with a railing? : None 6 Click Score: 24    End of Session   Activity Tolerance: Patient tolerated treatment well Patient left: in bed;with call bell/phone within reach   PT Visit Diagnosis: Difficulty in walking, not elsewhere classified (R26.2);Other abnormalities of gait and mobility (R26.89);Repeated falls (R29.6);Adult, failure to thrive (R62.7);Dizziness and giddiness (R42);Other symptoms and signs involving the nervous system (X32.355)     Time: 7322-0254 PT Time Calculation (min) (ACUTE ONLY): 19 min  Charges:  $Therapeutic Activity: 8-22 mins                     Minna Merritts, PT, MPT   Percell Locus 06/18/2022, 10:03 AM

## 2022-06-18 NOTE — Progress Notes (Addendum)
Progress Note   Patient: Ashley Camacho HKV:[id number] DOB: Sep 28, 1990 DOA: 06/09/2022     9 DOS: the patient was seen and examined on 06/18/2022   Brief hospital course: 32 year old female brought to the emergency room for weakness, vomiting and lethargy.  She was found to be in diabetic ketoacidosis.  She also has a history of depression and stopped taking her medication secondary to cost.  Patient has a history of adrenal insufficiency and diabetes.  1/18.  Patient interested in having a place to stay to help take care of her.  Case discussed with psychiatry to see the patient in consultation.  Tapering off insulin drip to long-acting insulin. 1/19.  Patient agreeable to Dobbhoff feeding tube.  IR placed but it still in the stomach at this point.  Will need an x-ray tomorrow to confirm that it is advanced before using. 1/20.  Dobbhoff tube has not advanced through the stomach despite 3 x-rays.  Case discussed with general surgery Dr. Dahlia Byes and he will take to the operating room for a jejunostomy tube tomorrow.  Dobbhoff tube removed. 1/21.  low sugar this morning requiring a amp of D50.  Jejunostomy tube placed by Dr. Dahlia Byes. 1/22.  Blood pressure on the lower side.  Will give a fluid bolus, restart stress dose steroids and give a unit of blood for hemoglobin of 7.4.  Hemoglobin after transfusion went up to 11.2 which is probably too high for 1 unit of blood. 1/23.  Patient feeling a little bit better.  Will taper stress dose steroids and hopefully switch over to Cortef for tomorrow.  Patient did have some intermittent vomiting yesterday.  Up to 30 mL an hour with tube feeds.  1/24: pt reports feeling miserable with recurrent N/V and abdominal discomfort.  Tube feeds just increased 40 >> 50 cc/hr.  Request RN reduce rate to see if she feels better.  Assessment and Plan: * Hypotension Improved with stress dose steroids, blood transfusion and fluids.  Tapered off stress dose steroids.  Resumed on  Cortef. Monitor BP.  DKA (diabetic ketoacidosis) ([place]) Type 1 diabetes with DKA on admission.  Sugars have been labile since admission (range from 95-334 past 24 hours). Continue long-acting insulin at night and use short acting insulin every 4 hours plus sliding scale.  Novolog 2 units Q6H for now, will resume Q4H if she continues eating well.  Gastroparesis Diabetic gastroparesis.  Continue trial of Reglan and erythromycin (limit erythromycin to 2 weeks). GI recommendations from 1/18-19 reviewed.   She has persistent N/V and abdominal pain on tube feeds. 1/26: since stopping Reglan yesterday and increasing erythromycin, reducing tube feed rate, she feels much better. Actually feels like eating for the first time --Discussed with GI (1/25), appreciate input --Stopped Reglan (hx of this not working & could be contributing to N/V)  --Increased erythromycin to 100 mg TID, can go up to 250 mg if needed. --May ultimately benefit from tertiary center referral for consideration of G-POEM procedure --Advised patient look at [facility] patient info PDF on gastroparesis diet for help finding foods that work best for her.  Acute kidney injury superimposed on chronic kidney disease ([place]) Acute kidney injury on CKD likely stage II.  Creatinine 2.58 on presentation and down to 1.38 at its lowest point.   Cr today 5.64>>3.32  Acute metabolic encephalopathy-resolved as of 06/17/2022 Resolved. Secondary to diabetic ketoacidosis.    Protein-calorie malnutrition, severe Jejunostomy tube placed on 1/21 by Dr. Dahlia Byes.  Continue to try to titrate tube  feeding up to goal.   1/24: pt reports increased abdominal pain and N/V with feed rate increased.  Trial today back rate off to 30 cc/hr 1/25: continue lower TF rate 30 cc/hr. 1/26: RD to change TF formula to concentrated so she can get adequate calories at lower feed rate  Hypophosphatemia Replaced on 1/22 Recurrent on 1/26 - replacing. 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  Transitioned to PO Cortef on 1/24.  Thrombocytopenia (HCC) Appears chronic.  Monitor CBC. Platelets 129k.  Chronic combined systolic and diastolic CHF (congestive heart failure) (HCC) Last echo 02/27/2021 -- EF 50-55% and normal diastolic parameters, apparently prior EF as low as 30%. --Monitor volume status with strict I/O's and daily weights --IV Lasix 20 mg yesterday 1/25 --Repeat IV Lasix 20 mg x 1 today --Further diuresis as needed, assess daily  Pseudohyponatremia Sodium initially low secondary to diabetic ketoacidosis.  Last sodium in the normal range.  Major depressive disorder, recurrent episode, moderate (HCC) Prozac started.  Appreciate psychiatry input.  Hypothyroidism TSH slightly high.  Restarted levothyroxine at a slightly higher dose.  Depression Appreciate psychiatric consultation.  Prozac started.  Underweight BMI 17.83 History of eating disorder as well. Now with J tube, attempting to optimize nutrition status, but limited by recurrent abdominal pain and N/V.  Anemia of chronic disease Status post transfusion of 2 units pRBC's  (on admission, and on 1/22) earlier this admission.  Ferritin elevated at 1511.   Hbg today 9.7 Monitor CBC          Subjective: Patient sitting up edge of bed and in good spirits, feeling better today and actually eating a sandwich.  This is the best she's felt and first regular food she has tolerated thus far.  Still has some flank edema, no shortness of breath.  We discussed gastroparesis diet and using a more concentrated tube feed formula so we can run it slowly and still get enough calories.  She is agreeable.   Physical Exam: Vitals:   06/18/22 0135 06/18/22 0411 06/18/22 0747 06/18/22 1234  BP:  119/77 128/77 (!) 113/90  Pulse:  77 76 85  Resp:  16 18 18   Temp:  98.7 F (37.1 C) 98 F (36.7 C)    TempSrc:      SpO2:  100% 100% 100%  Weight: 44.7 kg     Height:       General exam: in good spirits, awake, alert, no acute distress, frail and chronically ill-appearing, underweight HEENT: poor dentition, moist mucus membranes, hearing grossly normal  Respiratory system: on room air, normal respiratory effort. Cardiovascular system: RRR, no pedal edema.   Gastrointestinal system: soft, NT, ND, hypoactive bowel sounds.  J tube present with continuous tube feeds running at 30 cc/hr Central nervous system: A&O x 3. no gross focal neurologic deficits, normal speech Extremities: moves all , no edema, normal tone Skin: dry, intact, normal temperature, no visible swelling or erythema around J tube site Psychiatry: normal mood, congruent affect      Data Reviewed:  Notable labs -- glucose 217 >> 137>> 102 >>196 >> 188.  BMP with Cr improved 1.30 >> 1.15, BUN 40, Phosphorus 2.1.  Hbg 9.7, platelets 148k  Abdominal xray yesterday showed jejunostomy tube tip in left lower quadrant. Nonobstructive bowel gas pattern.  Disposition: Status is: Inpatient Remains inpatient appropriate because: Continuing tube feeding today.  Will decrease steroids IV today and switch over to Cortef for tomorrow.  Planned  Discharge Destination: To be determined    Time spent: 38 minutes  Author: Ezekiel Slocumb, DO 06/18/2022 1:54 PM  For on call review [email or web address].

## 2022-06-19 DIAGNOSIS — I5042 Chronic combined systolic (congestive) and diastolic (congestive) heart failure: Secondary | ICD-10-CM | POA: Diagnosis not present

## 2022-06-19 DIAGNOSIS — K3184 Gastroparesis: Secondary | ICD-10-CM | POA: Diagnosis not present

## 2022-06-19 DIAGNOSIS — N189 Chronic kidney disease, unspecified: Secondary | ICD-10-CM | POA: Diagnosis not present

## 2022-06-19 DIAGNOSIS — E43 Unspecified severe protein-calorie malnutrition: Secondary | ICD-10-CM | POA: Diagnosis not present

## 2022-06-19 DIAGNOSIS — E10649 Type 1 diabetes mellitus with hypoglycemia without coma: Secondary | ICD-10-CM | POA: Diagnosis not present

## 2022-06-19 DIAGNOSIS — N179 Acute kidney failure, unspecified: Secondary | ICD-10-CM | POA: Diagnosis not present

## 2022-06-19 DIAGNOSIS — E2740 Unspecified adrenocortical insufficiency: Secondary | ICD-10-CM | POA: Diagnosis not present

## 2022-06-19 DIAGNOSIS — D638 Anemia in other chronic diseases classified elsewhere: Secondary | ICD-10-CM | POA: Diagnosis not present

## 2022-06-19 LAB — BASIC METABOLIC PANEL
Anion gap: 8 (ref 5–15)
BUN: 51 mg/dL — ABNORMAL HIGH (ref 6–20)
CO2: 27 mmol/L (ref 22–32)
Calcium: 7.6 mg/dL — ABNORMAL LOW (ref 8.9–10.3)
Chloride: 99 mmol/L (ref 98–111)
Creatinine, Ser: 1.16 mg/dL — ABNORMAL HIGH (ref 0.44–1.00)
GFR, Estimated: >60 mL/min (ref >60)
Glucose, Bld: 197 mg/dL — ABNORMAL HIGH (ref 70–99)
Potassium: 4.6 mmol/L (ref 3.5–5.1)
Sodium: 134 mmol/L — ABNORMAL LOW (ref 135–145)

## 2022-06-19 LAB — PHOSPHORUS: Phosphorus: 1.6 mg/dL — ABNORMAL LOW (ref 2.5–4.6)

## 2022-06-19 LAB — GLUCOSE, CAPILLARY
Glucose-Capillary: 119 mg/dL — ABNORMAL HIGH (ref 70–99)
Glucose-Capillary: 130 mg/dL — ABNORMAL HIGH (ref 70–99)
Glucose-Capillary: 136 mg/dL — ABNORMAL HIGH (ref 70–99)
Glucose-Capillary: 170 mg/dL — ABNORMAL HIGH (ref 70–99)
Glucose-Capillary: 260 mg/dL — ABNORMAL HIGH (ref 70–99)
Glucose-Capillary: 293 mg/dL — ABNORMAL HIGH (ref 70–99)
Glucose-Capillary: 326 mg/dL — ABNORMAL HIGH (ref 70–99)
Glucose-Capillary: 342 mg/dL — ABNORMAL HIGH (ref 70–99)

## 2022-06-19 LAB — MAGNESIUM: Magnesium: 2.1 mg/dL (ref 1.7–2.4)

## 2022-06-19 MED ORDER — POTASSIUM & SODIUM PHOSPHATES 280-160-250 MG PO PACK
1.0000 | PACK | Freq: Three times a day (TID) | ORAL | Status: DC
  Administered 2022-06-19 (×4): 1 via JEJUNOSTOMY
  Filled 2022-06-19 (×6): qty 1

## 2022-06-19 MED ORDER — GLUCERNA SHAKE PO LIQD
237.0000 mL | Freq: Every day | ORAL | Status: DC
  Administered 2022-06-19: 237 mL via ORAL

## 2022-06-19 MED ORDER — FUROSEMIDE 10 MG/ML IJ SOLN: 20.0000 mg | Freq: Two times a day (BID) | INTRAMUSCULAR | Status: DC

## 2022-06-19 NOTE — Progress Notes (Signed)
Progress Note   Patient: Ashley Camacho:[id number] DOB: 1991/01/31 DOA: 06/09/2022     10 DOS: the patient was seen and examined on 06/19/2022   Brief hospital course: 33 year old female brought to the emergency room for weakness, vomiting and lethargy.  She was found to be in diabetic ketoacidosis.  She also has a history of depression and stopped taking her medication secondary to cost.  Patient has a history of adrenal insufficiency and diabetes.  1/18.  Patient interested in having a place to stay to help take care of her.  Case discussed with psychiatry to see the patient in consultation.  Tapering off insulin drip to long-acting insulin. 1/19.  Patient agreeable to Dobbhoff feeding tube.  IR placed but it still in the stomach at this point.  Will need an x-ray tomorrow to confirm that it is advanced before using. 1/20.  Dobbhoff tube has not advanced through the stomach despite 3 x-rays.  Case discussed with general surgery Dr. Everlene Farrier and he will take to the operating room for a jejunostomy tube tomorrow.  Dobbhoff tube removed. 1/21.  low sugar this morning requiring a amp of D50.  Jejunostomy tube placed by Dr. Everlene Farrier. 1/22.  Blood pressure on the lower side.  Will give a fluid bolus, restart stress dose steroids and give a unit of blood for hemoglobin of 7.4.  Hemoglobin after transfusion went up to 11.2 which is probably too high for 1 unit of blood. 1/23.  Patient feeling a little bit better.  Will taper stress dose steroids and hopefully switch over to Cortef for tomorrow.  Patient did have some intermittent vomiting yesterday.  Up to 30 mL an hour with tube feeds.  1/24: pt reports feeling miserable with recurrent N/V and abdominal discomfort.  Tube feeds just increased 40 >> 50 cc/hr.  Request RN reduce rate to see if she feels better. 1/25: stopped Reglan, increased erythromycin, reduced TF rate to 30 cc/hr.  Given IV Lasix for dependent edema. 1/26: pt eating a sandwich, N/V  improved today.  Notable improvement.  Given IV Lasix again.  Assessment and Plan: * Hypotension Improved with stress dose steroids, blood transfusion and fluids.  Tapered off stress dose steroids.  Resumed on Cortef. Monitor BP.  DKA (diabetic ketoacidosis) (HCC) Type 1 diabetes with DKA on admission.  Sugars have been labile since admission (range from 95-334 past 24 hours). Continue long-acting insulin at night and use short acting insulin every 4 hours plus sliding scale.  Novolog 2 units Q6H for now, will resume Q4H if she continues eating well.  Gastroparesis Diabetic gastroparesis.  Continue trial of Reglan and erythromycin (limit erythromycin to 2 weeks). GI recommendations from 1/18-19 reviewed.   She has persistent N/V and abdominal pain on tube feeds. 1/26: since stopping Reglan yesterday and increasing erythromycin, reducing tube feed rate, she feels much better. Actually feels like eating for the first time --Discussed with GI (1/25), appreciate input --Stopped Reglan (hx of this not working & could be contributing to N/V)  --Increased erythromycin to 100 mg TID, can go up to 250 mg if needed. --Change to Gastroparesis diet --May ultimately benefit from tertiary center referral for consideration of G-POEM procedure --Advised patient look at [facility] patient info PDF on gastroparesis diet for help finding foods that work best for her.  Acute kidney injury superimposed on chronic kidney disease (HCC) Acute kidney injury on CKD likely stage II.  Creatinine 2.58 on presentation and down to 1.38 at its lowest point.  Cr today 115 >> 7.12  Acute metabolic encephalopathy-resolved as of 06/17/2022 Resolved. Secondary to diabetic ketoacidosis.    Protein-calorie malnutrition, severe Jejunostomy tube placed on 1/21 by Dr. Dahlia Byes.  Continue to try to titrate tube feeding up to goal.   1/24: pt reports increased abdominal pain and N/V with feed rate increased.  Trial today  back rate off to 30 cc/hr 1/25: continue lower TF rate 30 cc/hr. 1/26: RD to change TF formula to concentrated so she can get adequate calories at lower feed rate  Hypophosphatemia Refeeding Syndrome Replaced on 1/22 Recurrent on 1/26 - replacing. Persistent on 1/27 - Phos-Nak packets per tube TID x 2 days Monitor phos level daily   Hypomagnesemia Replaced  Adrenal insufficiency (HCC) Initially required stress dose steroids.  Transitioned to PO Cortef on 1/24.  Thrombocytopenia (HCC) Appears chronic.  Monitor CBC. Platelets 129k.  Chronic combined systolic and diastolic CHF (congestive heart failure) ([place]) Last echo 02/27/2021 -- EF 50-55% and normal diastolic parameters, apparently prior EF as low as 30%. --Monitor volume status with strict I/O's and daily weights --Given IV Lasix 20 mg 1/22, 1/25, 1/26 --Hold off Lasix today as sodium is trending down, may be getting hypovolemic --Further diuresis as needed, assess daily  Pseudohyponatremia Sodium initially low secondary to diabetic ketoacidosis.  Last sodium in the normal range.  Major depressive disorder, recurrent episode, moderate (HCC) Prozac started.  Appreciate psychiatry input.  Hypothyroidism TSH slightly high.  Restarted levothyroxine at a slightly higher dose.  Depression Appreciate psychiatric consultation.  Prozac started.  Underweight BMI 17.83 History of eating disorder as well. Now with J tube, attempting to optimize nutrition status, but limited by recurrent abdominal pain and N/V.  Anemia of chronic disease Status post transfusion of 2 units pRBC's  (on admission, and on 1/22) earlier this admission.  Ferritin elevated at 1511.   Last Hbg 9.7 Monitor CBC          Subjective: Patient awake sitting up in bed today.  Reports a little increased abdominal discomfort after breakfast today.  Tolerated her meals yesterday pretty well.  She had french toast that was wheat bread, can be difficult in  gastroparesis.  Otherwise still having some dependent edema.  Feels a bit depressed as her 36 year old daughter is starting to try to walk and she is missing that milestone being here.     Physical Exam: Vitals:   06/18/22 2005 06/19/22 0500 06/19/22 0513 06/19/22 0825  BP: (!) 148/89  (!) 141/85 135/87  Pulse: 76  83 84  Resp: 16  16 16   Temp: 98.1 F (36.7 C)  98 F (36.7 C) 97.9 F (36.6 C)  TempSrc:    Oral  SpO2: 100%  100% 100%  Weight:  43 kg    Height:       General exam: awake, alert, no acute distress, frail and chronically ill-appearing, underweight HEENT: poor dentition, moist mucus membranes, hearing grossly normal  Respiratory system: on room air, normal respiratory effort. Cardiovascular system: RRR, no pedal edema.   Gastrointestinal system: soft, NT, ND, J tube with continuous tube feeds at 40 cc/hr Central nervous system: A&O x 3. no gross focal neurologic deficits, normal speech Extremities: moves all , no edema, normal tone Skin: dry, intact, normal temperature Psychiatry: normal mood, congruent affect      Data Reviewed:  Notable labs -- CBG's overall at goal.   Phos 1.6.  BMP with Na 134 from 139, glucose 197, Cr 1.16 stable, Ca 7.6  Disposition: Status is: Inpatient Remains inpatient appropriate because: Continuing tube feeding today.  Will decrease steroids IV today and switch over to Cortef for tomorrow.  Planned Discharge Destination: To be determined    Time spent: 38 minutes  Author: Ezekiel Slocumb, DO 06/19/2022 12:48 PM  For on call review [email or web address].

## 2022-06-20 DIAGNOSIS — D638 Anemia in other chronic diseases classified elsewhere: Secondary | ICD-10-CM | POA: Diagnosis not present

## 2022-06-20 DIAGNOSIS — E10649 Type 1 diabetes mellitus with hypoglycemia without coma: Secondary | ICD-10-CM | POA: Diagnosis not present

## 2022-06-20 DIAGNOSIS — I5042 Chronic combined systolic (congestive) and diastolic (congestive) heart failure: Secondary | ICD-10-CM | POA: Diagnosis not present

## 2022-06-20 DIAGNOSIS — G4700 Insomnia, unspecified: Secondary | ICD-10-CM | POA: Diagnosis present

## 2022-06-20 DIAGNOSIS — K3184 Gastroparesis: Secondary | ICD-10-CM | POA: Diagnosis not present

## 2022-06-20 DIAGNOSIS — N179 Acute kidney failure, unspecified: Secondary | ICD-10-CM | POA: Diagnosis not present

## 2022-06-20 DIAGNOSIS — N189 Chronic kidney disease, unspecified: Secondary | ICD-10-CM | POA: Diagnosis not present

## 2022-06-20 DIAGNOSIS — E43 Unspecified severe protein-calorie malnutrition: Secondary | ICD-10-CM | POA: Diagnosis not present

## 2022-06-20 DIAGNOSIS — E2740 Unspecified adrenocortical insufficiency: Secondary | ICD-10-CM | POA: Diagnosis not present

## 2022-06-20 LAB — MAGNESIUM: Magnesium: 2 mg/dL (ref 1.7–2.4)

## 2022-06-20 LAB — PHOSPHORUS: Phosphorus: 1.7 mg/dL — ABNORMAL LOW (ref 2.5–4.6)

## 2022-06-20 LAB — BASIC METABOLIC PANEL
Anion gap: 6 (ref 5–15)
BUN: 49 mg/dL — ABNORMAL HIGH (ref 6–20)
CO2: 28 mmol/L (ref 22–32)
Calcium: 7.7 mg/dL — ABNORMAL LOW (ref 8.9–10.3)
Chloride: 100 mmol/L (ref 98–111)
Creatinine, Ser: 1.05 mg/dL — ABNORMAL HIGH (ref 0.44–1.00)
GFR, Estimated: >60 mL/min (ref >60)
Glucose, Bld: 202 mg/dL — ABNORMAL HIGH (ref 70–99)
Potassium: 5 mmol/L (ref 3.5–5.1)
Sodium: 134 mmol/L — ABNORMAL LOW (ref 135–145)

## 2022-06-20 LAB — GLUCOSE, CAPILLARY
Glucose-Capillary: 142 mg/dL — ABNORMAL HIGH (ref 70–99)
Glucose-Capillary: 147 mg/dL — ABNORMAL HIGH (ref 70–99)
Glucose-Capillary: 198 mg/dL — ABNORMAL HIGH (ref 70–99)
Glucose-Capillary: 205 mg/dL — ABNORMAL HIGH (ref 70–99)
Glucose-Capillary: 381 mg/dL — ABNORMAL HIGH (ref 70–99)
Glucose-Capillary: 393 mg/dL — ABNORMAL HIGH (ref 70–99)
Glucose-Capillary: 459 mg/dL — ABNORMAL HIGH (ref 70–99)

## 2022-06-20 MED ORDER — INSULIN ASPART 100 UNIT/ML IJ SOLN
3.0000 [IU] | Freq: Four times a day (QID) | INTRAMUSCULAR | Status: DC
  Administered 2022-06-20 – 2022-06-21 (×4): 3 [IU] via SUBCUTANEOUS
  Filled 2022-06-20 (×4): qty 1

## 2022-06-20 MED ORDER — GLUCERNA SHAKE PO LIQD
237.0000 mL | Freq: Three times a day (TID) | ORAL | Status: DC
  Administered 2022-06-20 – 2022-07-03 (×34): 237 mL via ORAL

## 2022-06-20 MED ORDER — FUROSEMIDE 10 MG/ML IJ SOLN
20.0000 mg | Freq: Once | INTRAMUSCULAR | Status: AC
  Administered 2022-06-20: 20 mg via INTRAVENOUS
  Filled 2022-06-20: qty 2

## 2022-06-20 MED ORDER — MIRTAZAPINE 15 MG PO TABS
15.0000 mg | ORAL_TABLET | Freq: Every day | ORAL | Status: DC
  Administered 2022-06-20 – 2022-07-08 (×19): 15 mg via ORAL
  Filled 2022-06-20 (×19): qty 1

## 2022-06-20 MED ORDER — INSULIN ASPART 100 UNIT/ML IJ SOLN
0.0000 [IU] | Freq: Four times a day (QID) | INTRAMUSCULAR | Status: DC
  Administered 2022-06-20: 6 [IU] via SUBCUTANEOUS
  Administered 2022-06-21: 1 [IU] via SUBCUTANEOUS

## 2022-06-20 MED ORDER — POTASSIUM & SODIUM PHOSPHATES 280-160-250 MG PO PACK
2.0000 | PACK | Freq: Three times a day (TID) | ORAL | Status: DC
  Administered 2022-06-20 (×2): 2 via JEJUNOSTOMY
  Filled 2022-06-20 (×3): qty 2

## 2022-06-20 MED ORDER — MELATONIN 5 MG PO TABS
5.0000 mg | ORAL_TABLET | Freq: Every evening | ORAL | Status: DC | PRN
  Administered 2022-06-26 – 2022-06-28 (×2): 5 mg via ORAL
  Filled 2022-06-20 (×2): qty 1

## 2022-06-20 MED ORDER — SODIUM PHOSPHATES 45 MMOLE/15ML IV SOLN
30.0000 mmol | Freq: Once | INTRAVENOUS | Status: AC
  Administered 2022-06-20: 30 mmol via INTRAVENOUS
  Filled 2022-06-20: qty 10

## 2022-06-20 NOTE — Assessment & Plan Note (Addendum)
She used to be on Remeron for depression/anxiety and appetite stimulant. Appears not on this PTA, unclear when she last was taking it, she does not recall. --Started Remeron 15 mg at bedtime --As needed melatonin

## 2022-06-20 NOTE — Progress Notes (Signed)
Progress Note   Patient: Ashley Camacho FTD:[id number] DOB: 1991/04/27 DOA: 06/09/2022     11 DOS: the patient was seen and examined on 06/20/2022   Brief hospital course: 32 year old female brought to the emergency room for weakness, vomiting and lethargy.  She was found to be in diabetic ketoacidosis.  She also has a history of depression and stopped taking her medication secondary to cost.  Patient has a history of adrenal insufficiency and diabetes.  1/18.  Patient interested in having a place to stay to help take care of her.  Case discussed with psychiatry to see the patient in consultation.  Tapering off insulin drip to long-acting insulin. 1/19.  Patient agreeable to Dobbhoff feeding tube.  IR placed but it still in the stomach at this point.  Will need an x-ray tomorrow to confirm that it is advanced before using. 1/20.  Dobbhoff tube has not advanced through the stomach despite 3 x-rays.  Case discussed with general surgery Dr. Dahlia Byes and he will take to the operating room for a jejunostomy tube tomorrow.  Dobbhoff tube removed. 1/21.  low sugar this morning requiring a amp of D50.  Jejunostomy tube placed by Dr. Dahlia Byes. 1/22.  Blood pressure on the lower side.  Will give a fluid bolus, restart stress dose steroids and give a unit of blood for hemoglobin of 7.4.  Hemoglobin after transfusion went up to 11.2 which is probably too high for 1 unit of blood. 1/23.  Patient feeling a little bit better.  Will taper stress dose steroids and hopefully switch over to Cortef for tomorrow.  Patient did have some intermittent vomiting yesterday.  Up to 30 mL an hour with tube feeds.  1/24: pt reports feeling miserable with recurrent N/V and abdominal discomfort.  Tube feeds just increased 40 >> 50 cc/hr.  Request RN reduce rate to see if she feels better. 1/25: stopped Reglan, increased erythromycin, reduced TF rate to 30 cc/hr.  Given IV Lasix for dependent edema. 1/26: pt eating a sandwich, N/V  improved today.  Notable improvement.  Given IV Lasix again. 1/28: no N/V past couple days, tolerating meals much better, requesting Glucerna shakes. Agreeable to resume mirtazapine for sleep, anxiety and appetite stimulation.  Assessment and Plan: * Hypotension Improved with stress dose steroids, blood transfusion and fluids.  Tapered off stress dose steroids.  Resumed on Cortef. Monitor BP.  DKA (diabetic ketoacidosis) ([place]) Type 1 diabetes with DKA on admission.  Sugars have been labile since admission (range from 95-334 past 24 hours). Continue long-acting insulin at night and use short acting insulin every 4 hours plus sliding scale.  Novolog 2 units Q6H for now, will resume Q4H if she continues eating well.  Gastroparesis Diabetic gastroparesis.  Continue trial of Reglan and erythromycin (limit erythromycin to 2 weeks). GI recommendations from 1/18-19 reviewed.   She has persistent N/V and abdominal pain on tube feeds. 1/26: since stopping Reglan yesterday and increasing erythromycin, reducing tube feed rate, she feels much better. Actually feels like eating for the first time. 1/27>>28: continues to do well, tolerating meals, no N/V. --Discussed with GI (1/25), appreciate input --Stopped Reglan (hx of this not working for her & could be contributing to N/V)  --Increased erythromycin to 100 mg TID, can go up to 250 mg if needed. --Change to Gastroparesis diet --May ultimately benefit from tertiary center referral for consideration of G-POEM procedure --Advised patient look at [facility] patient info PDF on gastroparesis diet for help finding foods that work  best for her.  Acute kidney injury superimposed on chronic kidney disease ([place]) Acute kidney injury on CKD likely stage II.  Creatinine 2.58 on presentation and down to 1.38 at its lowest point.   Cr today 115 >> 7.42  Acute metabolic encephalopathy-resolved as of 06/17/2022 Resolved. Secondary to diabetic ketoacidosis.     Protein-calorie malnutrition, severe Jejunostomy tube placed on 1/21 by Dr. Dahlia Byes.  Started on tube feeds. --Appreciate Dietitian's recs 1/24: pt reports increased abdominal pain and N/V with feed rate increased.  Trial today back rate off to 30 cc/hr 1/25: continue lower TF rate 30 cc/hr. 1/26: RD to change TF formula to concentrated so she can get adequate calories at lower feed rate 1/27>>28: at goal 45 cc/hr TF's, tolerating meals well past couple of days, no N/V --Added Remeron for appetite stimulant, mood disorder/s and sleep. --May trial run tomorrow with bolus feeds to see how she tolerates. Continuous feeds at home would not be very feasible with 1-yr old.  Hypophosphatemia Refeeding Syndrome Replaced on 1/22 Recurrent on 1/26 - replacing. Persistent on 1/27 - Phos-Nak packets per tube TID x 2 days 1/28 increase phos-nak packets to 2 packs TID Monitor phos level daily   Hypomagnesemia Replaced & resolved. Continue to monitor.  Adrenal insufficiency ([place]) Initially required stress dose steroids.  Transitioned to PO Cortef on 1/24.  Thrombocytopenia (HCC) Appears chronic.  Monitor CBC. Platelets 129k.  Chronic combined systolic and diastolic CHF (congestive heart failure) ([place]) Last echo 02/27/2021 -- EF 50-55% and normal diastolic parameters, apparently prior EF as low as 30%. --Monitor volume status with strict I/O's and daily weights --Given IV Lasix 20 mg 1/22, 1/25, 1/26,  --Lasix 20 mg IV x 1 again today --Further diuresis as needed, assess daily  Pseudohyponatremia Sodium initially low secondary to diabetic ketoacidosis.  Last sodium in the normal range.  Major depressive disorder, recurrent episode, moderate (HCC) Seen by psychiatry. They started Prozac. Adding Remeron at bedtime to help with mood, sleep and appetite stimulation.  Hypothyroidism TSH slightly high.  Restarted levothyroxine at a slightly higher dose.  Depression Appreciate psychiatric  consultation.  Prozac started.  Insomnia She used to be on Remeron for depression/anxiety and appetite stimulant. Appears not on this PTA, unclear when she last was taking it, she does not recall. --Start Remeron 15 mg at bedtime --As needed melatonin  Underweight BMI 17.83 History of eating disorder as well. Now with J tube, attempting to optimize nutrition status, but limited by recurrent abdominal pain and N/V.  Anemia of chronic disease Status post transfusion of 2 units pRBC's  (on admission, and on 1/22) earlier this admission.  Ferritin elevated at 1511.   Last Hbg 9.7 Monitor CBC          Subjective: Patient awake sitting up in bed today.  Reports J tube dressing was changed earlier. She was not aware there are staple/s present and seeing this bothered her a little.  Reports no N/V and tolerating meals well past couple days.  Needs something to help with sleep.  Says she is getting more periods of feeling quite hungry.  When eating, she'll feel full, stop eating and short while later feels very hungry.  Agreeable to increased frequency of Glucerna supplement drinks.   Physical Exam: Vitals:   06/20/22 0500 06/20/22 0522 06/20/22 0803 06/20/22 1218  BP:  (!) 155/93 133/79 104/68  Pulse:  89 81 86  Resp:  16 17 18   Temp:  97.9 F (36.6 C) 98.3 F (36.8  C) 98.5 F (36.9 C)  TempSrc:  Oral    SpO2:  100% 100% 100%  Weight: 44.9 kg     Height:       General exam: awake, alert, no acute distress, frail and chronically ill-appearing, underweight HEENT: poor dentition, moist mucus membranes, hearing grossly normal  Respiratory system: on room air, normal respiratory effort. Cardiovascular system: RRR, no pedal edema.   Gastrointestinal system: soft, NT, ND, J tube with continuous tube feeds at 45 cc/hr Central nervous system: A&O x 3. no gross focal neurologic deficits, normal speech Extremities: moves all , no edema, normal tone Skin: dry, intact, normal  temperature Psychiatry: normal mood, congruent affect      Data Reviewed:  Notable labs -- Phos 1.7 from 1.6.  Na 134.  Glucose 202 on BMP.  Cr 1.05 from 1.16.  Ca 7.7.     Disposition: Status is: Inpatient Remains inpatient appropriate because: Continuing tube feeding today.  Will decrease steroids IV today and switch over to Cortef for tomorrow.  Planned Discharge Destination: To be determined    Time spent: 38 minutes  Author: Pennie Banter, DO 06/20/2022 1:52 PM  For on call review [email or web address].

## 2022-06-21 DIAGNOSIS — K3184 Gastroparesis: Secondary | ICD-10-CM | POA: Diagnosis not present

## 2022-06-21 DIAGNOSIS — E10649 Type 1 diabetes mellitus with hypoglycemia without coma: Secondary | ICD-10-CM | POA: Diagnosis not present

## 2022-06-21 DIAGNOSIS — E2740 Unspecified adrenocortical insufficiency: Secondary | ICD-10-CM | POA: Diagnosis not present

## 2022-06-21 DIAGNOSIS — E43 Unspecified severe protein-calorie malnutrition: Secondary | ICD-10-CM | POA: Diagnosis not present

## 2022-06-21 DIAGNOSIS — N189 Chronic kidney disease, unspecified: Secondary | ICD-10-CM | POA: Diagnosis not present

## 2022-06-21 DIAGNOSIS — N179 Acute kidney failure, unspecified: Secondary | ICD-10-CM | POA: Diagnosis not present

## 2022-06-21 DIAGNOSIS — D638 Anemia in other chronic diseases classified elsewhere: Secondary | ICD-10-CM | POA: Diagnosis not present

## 2022-06-21 DIAGNOSIS — I5042 Chronic combined systolic (congestive) and diastolic (congestive) heart failure: Secondary | ICD-10-CM | POA: Diagnosis not present

## 2022-06-21 LAB — GLUCOSE, CAPILLARY
Glucose-Capillary: 174 mg/dL — ABNORMAL HIGH (ref 70–99)
Glucose-Capillary: 175 mg/dL — ABNORMAL HIGH (ref 70–99)
Glucose-Capillary: 328 mg/dL — ABNORMAL HIGH (ref 70–99)
Glucose-Capillary: 477 mg/dL — ABNORMAL HIGH (ref 70–99)
Glucose-Capillary: 56 mg/dL — ABNORMAL LOW (ref 70–99)
Glucose-Capillary: 70 mg/dL (ref 70–99)

## 2022-06-21 LAB — BASIC METABOLIC PANEL
Anion gap: 9 (ref 5–15)
BUN: 60 mg/dL — ABNORMAL HIGH (ref 6–20)
CO2: 31 mmol/L (ref 22–32)
Calcium: 7.7 mg/dL — ABNORMAL LOW (ref 8.9–10.3)
Chloride: 101 mmol/L (ref 98–111)
Creatinine, Ser: 1.03 mg/dL — ABNORMAL HIGH (ref 0.44–1.00)
GFR, Estimated: >60 mL/min (ref >60)
Glucose, Bld: 179 mg/dL — ABNORMAL HIGH (ref 70–99)
Potassium: 4.6 mmol/L (ref 3.5–5.1)
Sodium: 141 mmol/L (ref 135–145)

## 2022-06-21 LAB — MAGNESIUM: Magnesium: 2.1 mg/dL (ref 1.7–2.4)

## 2022-06-21 LAB — ALBUMIN: Albumin: 2.7 g/dL — ABNORMAL LOW (ref 3.5–5.0)

## 2022-06-21 LAB — PHOSPHORUS: Phosphorus: 4.1 mg/dL (ref 2.5–4.6)

## 2022-06-21 MED ORDER — FREE WATER
125.0000 mL | Status: DC
  Administered 2022-06-21 – 2022-06-29 (×46): 125 mL

## 2022-06-21 MED ORDER — ACETAMINOPHEN 500 MG PO TABS
1000.0000 mg | ORAL_TABLET | Freq: Four times a day (QID) | ORAL | Status: DC | PRN
  Administered 2022-07-08 (×2): 1000 mg via ORAL
  Filled 2022-06-21 (×2): qty 2

## 2022-06-21 MED ORDER — INSULIN ASPART 100 UNIT/ML IJ SOLN
3.0000 [IU] | INTRAMUSCULAR | Status: DC
  Administered 2022-06-21 – 2022-06-22 (×5): 3 [IU] via SUBCUTANEOUS
  Filled 2022-06-21 (×5): qty 1

## 2022-06-21 MED ORDER — PANTOPRAZOLE SODIUM 40 MG PO TBEC
40.0000 mg | DELAYED_RELEASE_TABLET | Freq: Every day | ORAL | Status: DC
  Administered 2022-06-21 – 2022-07-08 (×18): 40 mg via ORAL
  Filled 2022-06-21 (×18): qty 1

## 2022-06-21 MED ORDER — INSULIN ASPART 100 UNIT/ML IJ SOLN
0.0000 [IU] | INTRAMUSCULAR | Status: DC
  Administered 2022-06-21: 1 [IU] via SUBCUTANEOUS
  Administered 2022-06-21: 6 [IU] via SUBCUTANEOUS
  Administered 2022-06-21 – 2022-06-22 (×2): 1 [IU] via SUBCUTANEOUS
  Administered 2022-06-22: 4 [IU] via SUBCUTANEOUS
  Administered 2022-06-22: 1 [IU] via SUBCUTANEOUS
  Administered 2022-06-22: 5 [IU] via SUBCUTANEOUS
  Administered 2022-06-23: 1 [IU] via SUBCUTANEOUS
  Administered 2022-06-23: 6 [IU] via SUBCUTANEOUS
  Administered 2022-06-23: 1 [IU] via SUBCUTANEOUS
  Administered 2022-06-23: 5 [IU] via SUBCUTANEOUS
  Administered 2022-06-24: 4 [IU] via SUBCUTANEOUS
  Administered 2022-06-24: 3 [IU] via SUBCUTANEOUS
  Administered 2022-06-24: 2 [IU] via SUBCUTANEOUS
  Filled 2022-06-21 (×14): qty 1

## 2022-06-21 MED ORDER — OSMOLITE 1.5 CAL PO LIQD
1200.0000 mL | ORAL | Status: DC
  Administered 2022-06-21 – 2022-06-28 (×9): 1200 mL

## 2022-06-21 MED ORDER — OSMOLITE 1.5 CAL PO LIQD: 900.0000 mL | ORAL | Status: DC

## 2022-06-21 NOTE — TOC Progression Note (Addendum)
Transition of Care [facility]) - Progression Note    Patient Details  Name: Ashley Camacho MRN: [id number] Date of Birth: 20-Nov-1990  Transition of Care [facility]) CM/SW Contact  Gerilyn Pilgrim, LCSW Phone Number: 06/21/2022, 12:05 PM  Clinical Narrative:   CSW spoke with patient regarding discharge plan. Pt states that she would rather go to an alternative setting such as a group home rather than discharge home. Pt states she does not feel she is able to take care of her children at home. Pt has talked to the Childrens father about this and he feels it is best that she leaves due to being unable to assist with children. Joy with [facility] contacted but still have not heard back. CSW contacted [facility] La[facility] Flatten to assist with placement needs.    12:21pm Referral sent to Janell Quiet who assists with group home placement.   2:36pm  Joy with DSS reports she will be coming on 1/30 to assess pt.   Expected Discharge Plan:  (TBD) Barriers to Discharge: Continued Medical Work up  Expected Discharge Plan and Services In-house Referral: Development worker, community, Nutrition Discharge Planning Services: CM Consult   Living arrangements for the past 2 months: Single Family Home                                       Social Determinants of Health (SDOH) Interventions SDOH Screenings   Food Insecurity: No Food Insecurity (06/10/2022)  Housing: Low Risk  (06/10/2022)  Transportation Needs: No Transportation Needs (06/10/2022)  Utilities: Not At Risk (06/10/2022)  Depression (PHQ2-9): Low Risk  (11/11/2020)  Tobacco Use: Low Risk  (06/17/2022)    Readmission Risk Interventions    11/03/2021   10:37 AM 06/16/2021   12:30 PM 06/03/2021    4:26 PM  Readmission Risk Prevention Plan  Transportation Screening Complete Complete Complete  Medication Review Press photographer) Complete Complete Complete  PCP or Specialist appointment within 3-5 days of discharge Complete Complete Complete  HRI or  [place] Not Complete  Not Complete  HRI or Home Care Consult Pt Refusal Comments NA  NA  SW Recovery Care/Counseling Consult Not Complete Complete Complete  SW Consult Not Complete Comments NA    Palliative Care Screening Not Applicable Not Applicable Not [place] Not Applicable Not Applicable Not Applicable

## 2022-06-21 NOTE — NC FL2 (Signed)
[place] LEVEL OF CARE FORM     IDENTIFICATION  Patient Name: Ashley Camacho Birthdate: [birth year] Sex: female Admission Date (Current Location): 06/09/2022  [facility] and [place] Number:  Network engineer and Address:  [facility], [address] [place], [place] [address]      Provider Number: [id number]  Attending Physician Name and Address:  Ezekiel Slocumb, DO  Relative Name and Phone Number:       Current Level of Care: Hospital Recommended Level of Care: [facility] Prior Approval Number:    Date Approved/Denied:   PASRR Number:    Discharge Plan: Other (Comment) ([place])    Current Diagnoses: Patient Active Problem List   Diagnosis Date Noted   Insomnia 06/20/2022   Hypophosphatemia 06/13/2022   Protein-calorie malnutrition, severe 06/13/2022   Hypotension 06/10/2022   Pseudohyponatremia 06/10/2022   Hypomagnesemia 06/10/2022   Underweight 06/10/2022   Adrenal insufficiency ([place]) 06/09/2022   Acute gastroenteritis 02/28/2022   Hypokalemia 02/28/2022   Hypocalcemia 42/59/5638   Metabolic acidosis 75/64/3329   Essential hypertension 02/28/2022   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) 06/14/2021   CKD stage 3 due to type 1 diabetes mellitus ([place]) 06/14/2021   Transaminitis 06/14/2021   Hypoglycemia associated with diabetes ([place]) 06/03/2021   DKA, type 1 ([place]) 05/20/2021   Major depressive disorder, recurrent episode, moderate ([place]) 05/08/2021   Right arm pain 05/07/2021   Rash of hand 05/07/2021   Hyperkalemia 05/06/2021   History of adrenal insufficiency 05/06/2021   CAP (community  acquired pneumonia) 05/05/2021   Hypoglycemia 04/27/2021   Postoperative hematoma involving genitourinary system following genitourinary procedure    S/P cesarean section 02/17/2021   Acute esophagitis    Leg edema    History of preterm delivery, currently pregnant    Pre-existing type 1 diabetes mellitus during pregnancy in third trimester    Anemia of chronic disease    Type 1 diabetes mellitus affecting pregnancy in second trimester, antepartum    HFrEF (heart failure with reduced ejection fraction) (HCC)    Acute deep vein thrombosis (DVT) of brachial vein of right upper extremity ([place]) 12/17/2020   Anxiety 12/17/2020   Diabetic retinopathy ([place]) 12/17/2020   Chronic combined systolic and diastolic CHF (congestive heart failure) ([place])    Hyperemesis 12/13/2020   Type 1 diabetes mellitus with hypoglycemia without coma ([place]) 12/13/2020   Sunburn 12/13/2020   Hypothyroidism 12/10/2020   Malnutrition ([place]) 12/10/2020   Vitreous hemorrhage of right eye ([place]) 12/10/2020   Pleural effusion associated with pulmonary infection 11/25/2020   Pneumonia affecting pregnancy 11/24/2020   Diabetes mellitus affecting pregnancy, second trimester 11/24/2020   Edema 11/24/2020   Decreased urine output 11/24/2020   Shortness of breath    Zinc deficiency    SOB (shortness of breath)    Social problem 11/21/2020   Nausea and vomiting during pregnancy prior to [redacted] weeks gestation 11/21/2020   Low serum vitamin B12    Delivery with history of C-section 11/20/2020   Absolute anemia    Acute kidney injury superimposed on chronic kidney disease (HCC)    Nausea and vomiting during pregnancy 10/23/2020   History of premature delivery, currently pregnant, second trimester  10/13/2020   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   Depression 09/28/2018   Peripheral neuropathy 09/28/2018   Gastroparesis 04/22/2016   Diabetic sensorimotor neuropathy ([place]) 04/19/2016   DKA (diabetic ketoacidoses) 04/03/2016    Microalbuminuria 09/26/2005    Orientation RESPIRATION BLADDER Height & Weight     Self, Time, Situation, Place  Normal Continent Weight: 99 lb 3.3 oz (45 kg) Height:  5\' 1"  (154.9 cm)  BEHAVIORAL SYMPTOMS/MOOD NEUROLOGICAL BOWEL NUTRITION STATUS      Continent Feeding tube  AMBULATORY STATUS COMMUNICATION OF NEEDS Skin   Supervision Verbally Normal                       Personal Care Assistance Level of Assistance  Bathing, Feeding, Dressing Bathing Assistance: Limited assistance Feeding assistance: Limited assistance Dressing Assistance: Limited assistance     Functional Limitations Info  Sight, Hearing, Speech Sight Info: Adequate Hearing Info: Adequate Speech Info: Adequate    SPECIAL CARE FACTORS FREQUENCY                       Contractures Contractures Info: Not present    Additional Factors Info  Code Status Code Status Info: FULL             Current Medications (06/21/2022):  This is the current hospital active medication list Current Facility-Administered Medications  Medication Dose Route Frequency Provider Last Rate Last Admin   acetaminophen (TYLENOL) tablet 1,000 mg  1,000 mg Oral Q6H PRN Nicole Kindred A, DO       alum & mag hydroxide-simeth (MAALOX/MYLANTA) 200-200-20 MG/5ML suspension 30 mL  30 mL Oral Q4H PRN Pabon, Diego F, MD   30 mL at 06/16/22 2126   dextrose 50 % solution 0-50 mL  0-50 mL Intravenous PRN Pabon, Bea Graff F, MD   50 mL at 06/14/22 1245   erythromycin ethylsuccinate (EES) 200 MG/5ML suspension 100 mg  100 mg Oral TID AC Nicole Kindred A, DO   100 mg at 06/21/22 0955   feeding supplement (GLUCERNA SHAKE) (GLUCERNA SHAKE) liquid 237 mL  237 mL Oral TID PC & HS Nicole Kindred A, DO   237 mL at 06/21/22 0955   feeding supplement (OSMOLITE 1.5 CAL) liquid 1,000 mL  1,000 mL Per Tube Continuous Nicole Kindred A, DO 45 mL/hr at 06/20/22 1741 1,000 mL at 06/20/22 1741   FLUoxetine (PROZAC) capsule 10 mg  10 mg Oral Daily  Pabon, [place] F, MD   10 mg at 06/21/22 0956   free water 120 mL  120 mL Per Tube Q4H Nicole Kindred A, DO   120 mL at 06/21/22 0800   hydrocortisone (CORTEF) tablet 20 mg  20 mg Oral Daily Loletha Grayer, MD   20 mg at 06/21/22 8469   And   hydrocortisone (CORTEF) tablet 10 mg  10 mg Oral Daily Loletha Grayer, MD   10 mg at 06/20/22 1734   hydrOXYzine (ATARAX) tablet 10 mg  10 mg Oral TID PRN Caroleen Hamman F, MD   10 mg at 06/20/22 0004   insulin aspart (novoLOG) injection 0-6 Units  0-6 Units Subcutaneous Q4H Nicole Kindred A, DO   1 Units at 06/21/22 0957   insulin aspart (novoLOG) injection 3 Units  3 Units Subcutaneous Q4H Nicole Kindred A, DO   3 Units at 06/21/22 0956   insulin glargine-yfgn (SEMGLEE) injection 5 Units  5 Units Subcutaneous QHS Loletha Grayer, MD   5 Units at 06/20/22 2206   levothyroxine (SYNTHROID)  tablet 50 mcg  50 mcg Oral Q0600 Pabon, [place] F, MD   50 mcg at 06/21/22 0559   melatonin tablet 5 mg  5 mg Oral QHS PRN Esaw Grandchild A, DO       mirtazapine (REMERON) tablet 15 mg  15 mg Oral QHS Esaw Grandchild A, DO   15 mg at 06/20/22 2206   multivitamin with minerals tablet 1 tablet  1 tablet Oral Daily Sterling Big F, MD   1 tablet at 06/21/22 7793   oxyCODONE (Oxy IR/ROXICODONE) immediate release tablet 5 mg  5 mg Oral Q4H PRN Esaw Grandchild A, DO   5 mg at 06/17/22 0058   pantoprazole (PROTONIX) EC tablet 40 mg  40 mg Oral QHS Esaw Grandchild A, DO       promethazine (PHENERGAN) tablet 12.5 mg  12.5 mg Oral Q6H PRN Sterling Big F, MD   12.5 mg at 06/17/22 1535     Discharge Medications: Please see discharge summary for a list of discharge medications.  Relevant Imaging Results:  Relevant Lab Results:   Additional Information SS# [id number]  Allena Katz, LCSW

## 2022-06-21 NOTE — Inpatient Diabetes Management (Signed)
Inpatient Diabetes Program Recommendations  AACE/ADA: New Consensus Statement on Inpatient Glycemic Control   Target Ranges:  Prepandial:   less than 140 mg/dL      Peak postprandial:   less than 180 mg/dL (1-2 hours)      Critically ill patients:  140 - 180 mg/dL    Latest Reference Range & Units 06/20/22 08:01 06/20/22 12:16 06/20/22 15:41 06/20/22 22:09 06/20/22 23:12 06/21/22 04:07  Glucose-Capillary 70 - 99 mg/dL 142 (H) 147 (H) 393 (H) 459 (H) 381 (H) 175 (H)   Review of Glycemic Control  Diabetes history: DM1 Outpatient Diabetes medications: Lantus 6 units daily, Novolog correction and carb coverage Current orders for Inpatient glycemic control: Semglee 5 units QHS, Novolog 3 units Q6H, Novolog 0-6 units Q6H; Cortef 20 mg QAM, Cortef 10 mg QPM, Glucerna 237 ml TID, Osmolite @ 45 ml/hr  Inpatient Diabetes Program Recommendations:    Insulin: Please consider changing frequency of Novolog correction and tube feeding coverage to Novolog 3 units Q4H and Novolog 0-6 units Q4H.  Thanks, Barnie Alderman, RN, MSN, [place] Diabetes Coordinator Inpatient Diabetes Program [phone] (Team Pager from 8am to [place])

## 2022-06-21 NOTE — Progress Notes (Signed)
Progress Note   Patient: Ashley Camacho:[id number] DOB: September 20, 1990 DOA: 06/09/2022     12 DOS: the patient was seen and examined on 06/21/2022   Brief hospital course: 32 year old female brought to the emergency room for weakness, vomiting and lethargy.  She was found to be in diabetic ketoacidosis.  She also has a history of depression and stopped taking her medication secondary to cost.  Patient has a history of adrenal insufficiency and diabetes.  1/18.  Patient interested in having a place to stay to help take care of her.  Case discussed with psychiatry to see the patient in consultation.  Tapering off insulin drip to long-acting insulin. 1/19.  Patient agreeable to Dobbhoff feeding tube.  IR placed but it still in the stomach at this point.  Will need an x-ray tomorrow to confirm that it is advanced before using. 1/20.  Dobbhoff tube has not advanced through the stomach despite 3 x-rays.  Case discussed with general surgery Dr. Dahlia Byes and he will take to the operating room for a jejunostomy tube tomorrow.  Dobbhoff tube removed. 1/21.  low sugar this morning requiring a amp of D50.  Jejunostomy tube placed by Dr. Dahlia Byes. 1/22.  Blood pressure on the lower side.  Will give a fluid bolus, restart stress dose steroids and give a unit of blood for hemoglobin of 7.4.  Hemoglobin after transfusion went up to 11.2 which is probably too high for 1 unit of blood. 1/23.  Patient feeling a little bit better.  Will taper stress dose steroids and hopefully switch over to Cortef for tomorrow.  Patient did have some intermittent vomiting yesterday.  Up to 30 mL an hour with tube feeds.  1/24: pt reports feeling miserable with recurrent N/V and abdominal discomfort.  Tube feeds just increased 40 >> 50 cc/hr.  Request RN reduce rate to see if she feels better. 1/25: stopped Reglan, increased erythromycin, reduced TF rate to 30 cc/hr.  Given IV Lasix for dependent edema. 1/26: pt eating a sandwich, N/V  improved today.  Notable improvement.  Given IV Lasix again. 1/28: no N/V past couple days, tolerating meals much better, requesting Glucerna shakes. Agreeable to resume mirtazapine for sleep, anxiety and appetite stimulation. 1/29: transition to nocturnal feeds tonight. She continues to do well.  Remeron helped w sleep.  Assessment and Plan: * Hypotension Improved with stress dose steroids, blood transfusion and fluids.  Tapered off stress dose steroids.  Resumed on Cortef. Monitor BP.  DKA (diabetic ketoacidosis) ([place]) Type 1 diabetes with DKA on admission.  Sugars have been labile since admission, more steady since tolerating PO intake better. --Continue long-acting insulin at night  --Novolog 2 units + SSI Q4H --Adjust for goal 140-180  Gastroparesis Diabetic gastroparesis.  Continue trial of Reglan and erythromycin (limit erythromycin to 2 weeks). GI recommendations from 1/18-19 reviewed.   She has persistent N/V and abdominal pain on tube feeds. 1/26: since stopping Reglan yesterday and increasing erythromycin, reducing tube feed rate, she feels much better. Actually feels like eating for the first time. 1/27>>29: continues to do well, tolerating meals, no N/V. --Discussed with GI (1/25), appreciate input --Stopped Reglan (hx of this not working for her & could be contributing to N/V)  --Increased erythromycin to 100 mg TID, can go up to 250 mg if needed. --Change to Gastroparesis diet --May ultimately benefit from tertiary center referral for consideration of G-POEM procedure --Advised patient look at [facility] patient info PDF on gastroparesis diet for help finding foods  that work best for her.  Acute kidney injury superimposed on chronic kidney disease (HCC) Acute kidney injury on CKD likely stage II.  Creatinine 2.58 on presentation and down to 1.38 at its lowest point.   Cr today 1.15 >> 1.16... 1.03  Acute metabolic encephalopathy-resolved as of  06/17/2022 Resolved. Secondary to diabetic ketoacidosis.    Protein-calorie malnutrition, severe Jejunostomy tube placed on 1/21 by Dr. Everlene Farrier.  Started on tube feeds. --Appreciate Dietitian's recs 1/24: pt reports increased abdominal pain and N/V with feed rate increased.  Trial today back rate off to 30 cc/hr 1/25: continue lower TF rate 30 cc/hr. 1/26: RD to change TF formula to concentrated so she can get adequate calories at lower feed rate 1/27>>28: at goal 45 cc/hr TF's, tolerating meals well past couple of days, no N/V --Added Remeron for appetite stimulant, mood disorder/s and sleep. --Will transition to nocturnal feeds tonight. Monitor for tolerance as this will be more feasible at home --Requested nurses to teach patient how to manage her tube feeds and pump  Hypophosphatemia Refeeding Syndrome Replaced on 1/22, 1/26, 1/27 1/28 given IV phos-Nak 1/29 phos 4.1 Monitor phos level daily until stable   Hypomagnesemia Replaced & resolved. Continue to monitor.  Adrenal insufficiency (HCC) Initially required stress dose steroids.  Transitioned to PO Cortef on 1/24.  Thrombocytopenia (HCC) Appears chronic.  Monitor CBC. Platelets 129k.  Chronic combined systolic and diastolic CHF (congestive heart failure) (HCC) Last echo 02/27/2021 -- EF 50-55% and normal diastolic parameters, apparently prior EF as low as 30%. --Monitor volume status with strict I/O's and daily weights --Given IV Lasix 20 mg 1/22, 1/25, 1/26, 1/28  --Further diuresis as needed, assess daily  Pseudohyponatremia Sodium initially low secondary to diabetic ketoacidosis.  Last sodium in the normal range.  Major depressive disorder, recurrent episode, moderate (HCC) Seen by psychiatry. They started Prozac. Adding Remeron at bedtime to help with mood, sleep and appetite stimulation.  Hypothyroidism TSH slightly high.  Restarted levothyroxine at a slightly higher dose.  Depression Appreciate psychiatric  consultation.  Prozac started.  Insomnia She used to be on Remeron for depression/anxiety and appetite stimulant. Appears not on this PTA, unclear when she last was taking it, she does not recall. --Start Remeron 15 mg at bedtime --As needed melatonin  Underweight BMI 17.83 History of eating disorder as well. Now with J tube, attempting to optimize nutrition status, but limited by recurrent abdominal pain and N/V.  Anemia of chronic disease Status post transfusion of 2 units pRBC's  (on admission, and on 1/22) earlier this admission.  Ferritin elevated at 1511.   Last Hbg 9.7 Monitor CBC          Subjective: Patient awake sitting up in bed today.  Reports later yesterday was not good, after eating a quesadilla and fries for lunch, had increased abdominal pain again and diarrhea. Feels like the cheese may have been too heavy.  We discussed keeping a food diary, in order to remember what foods cause her trouble.  She reports sleeping well after starting Remeron last night.     Physical Exam: Vitals:   06/20/22 1542 06/20/22 1922 06/21/22 0317 06/21/22 0409  BP: (!) 148/79 136/86  138/83  Pulse: 91 (!) 102  93  Resp: 18 20  16   Temp: 98.3 F (36.8 C) 98.6 F (37 C)  98 F (36.7 C)  TempSrc:  Oral    SpO2: 100% 100%  100%  Weight:   45 kg   Height:  General exam: awake, alert, no acute distress, frail and chronically ill-appearing, underweight HEENT: moist mucus membranes, hearing grossly normal  Respiratory system: on room air, normal respiratory effort. Cardiovascular system: RRR, no pedal edema.   Gastrointestinal system: soft, NT, ND, J tube with continuous tube feeds at 45 cc/hr Central nervous system: A&O x 3. no gross focal neurologic deficits, normal speech Extremities: moves all , no edema, normal tone Skin: dry, intact, normal temperature Psychiatry: normal mood, congruent affect      Data Reviewed:  Notable labs -- Phos 1.7 from 1.6.  Na 134.   Glucose 202 on BMP.  Cr 1.05 from 1.16.  Ca 7.7.     Disposition: Status is: Inpatient Remains inpatient appropriate because: Continuing tube feeding today.  Will decrease steroids IV today and switch over to Cortef for tomorrow.  Planned Discharge Destination: To be determined    Time spent: 46 minutes  Author: Ezekiel Slocumb, DO 06/21/2022 12:46 PM  For on call review [email or web address].

## 2022-06-21 NOTE — Progress Notes (Signed)
Nutrition Follow-up  DOCUMENTATION CODES:   Underweight, Severe malnutrition in context of chronic illness  INTERVENTION:   -Continue Glucerna Shake po TID, each supplement provides 220 kcal and 10 grams of protein  -TF via j-tube:  Osmolite 1.5 @ 75 ml/hr over 16 hours  125 ml free water flush every 4 hours  Tube feeding regimen provides 1800 kcal (100% of needs), 75 grams of protein, and 914 ml of H2O.  Total free water: 1664 ml daily  NUTRITION DIAGNOSIS:   Severe Malnutrition related to social / environmental circumstances as evidenced by moderate muscle depletion, severe muscle depletion, moderate fat depletion, severe fat depletion, percent weight loss.  Ongoing  GOAL:   Patient will meet greater than or equal to 90% of their needs  Progressing   MONITOR:   PO intake, Supplement acceptance, Diet advancement  REASON FOR ASSESSMENT:   Consult Assessment of nutrition requirement/status, Calorie Count  ASSESSMENT:   Ashley Camacho with PMH of type 1 DM, HTN, gastroparesis, anemia, and adrenal insufficiency admitted for evaluation of weakness and vomiting.  1/19- IR placed NGT 1/21- s/p open j-tube placement  Reviewed I/O's: +1 L x 24 hours and +12.6 L since admission   Case discussed with MD; requesting to transition to bolus feeds. Explained that bolus feeds are contraindicated due to j-tube, but able to trial nocturnal feedings.   Spoke with Ashley Camacho at bedside, who was pleasant and in good spirits today. Ashley Camacho reports she is eating better, but trying to figure out what she can tolerate. Per Ashley Camacho, she is consuming Glucerna, broccoli, carrots, and eggs. She has not had any vomiting today, but reports as far as how she feels and intake goes, she has good days and bad days. She is currently tolerating TF well.   Discussed transitioning to nocturnal feeds and what this would look like. Ashley Camacho is amenable to this and is encouraged by being able to participate in mobility without being attached  to the feeding pump.   Reviewed plan with MD and RN.   Medications reviewed and include erythromycin, prozac, and remeron.   Labs reviewed: CBGS: 102-585 (inpatient orders for glycemic control are 0-6 units insulin aspart every 4 hours, 3 units insulin aspart every 4 hours, and 5 units glargine daily at bedtime).    Diet Order:   Diet Order             DIET SOFT Room service appropriate? Yes; Fluid consistency: Thin  Diet effective now                   EDUCATION NEEDS:   Education needs have been addressed  Skin:  Skin Assessment: Skin Integrity Issues: Skin Integrity Issues:: Incisions Incisions: closed abdomen s/p j-tube placement  Last BM:  06/20/22  Height:   Ht Readings from Last 1 Encounters:  06/10/22 5\' 1"  (1.549 m)    Weight:   Wt Readings from Last 1 Encounters:  06/21/22 45 kg    Ideal Body Weight:  47.7 kg  BMI:  Body mass index is 18.74 kg/m.  Estimated Nutritional Needs:   Kcal:  1650-1850  Protein:  80-95 grams  Fluid:  > 1.6 L    Loistine Chance, RD, LDN, [place] Registered Dietitian II Certified Diabetes Care and Education Specialist Please refer to [facility] for RD and/or RD on-call/weekend/after hours pager

## 2022-06-22 DIAGNOSIS — D638 Anemia in other chronic diseases classified elsewhere: Secondary | ICD-10-CM | POA: Diagnosis not present

## 2022-06-22 DIAGNOSIS — N189 Chronic kidney disease, unspecified: Secondary | ICD-10-CM | POA: Diagnosis not present

## 2022-06-22 DIAGNOSIS — E10649 Type 1 diabetes mellitus with hypoglycemia without coma: Secondary | ICD-10-CM | POA: Diagnosis not present

## 2022-06-22 DIAGNOSIS — E43 Unspecified severe protein-calorie malnutrition: Secondary | ICD-10-CM | POA: Diagnosis not present

## 2022-06-22 DIAGNOSIS — N179 Acute kidney failure, unspecified: Secondary | ICD-10-CM | POA: Diagnosis not present

## 2022-06-22 DIAGNOSIS — K3184 Gastroparesis: Secondary | ICD-10-CM | POA: Diagnosis not present

## 2022-06-22 DIAGNOSIS — I5042 Chronic combined systolic (congestive) and diastolic (congestive) heart failure: Secondary | ICD-10-CM | POA: Diagnosis not present

## 2022-06-22 DIAGNOSIS — E2740 Unspecified adrenocortical insufficiency: Secondary | ICD-10-CM | POA: Diagnosis not present

## 2022-06-22 LAB — BASIC METABOLIC PANEL
Anion gap: 8 (ref 5–15)
BUN: 58 mg/dL — ABNORMAL HIGH (ref 6–20)
CO2: 30 mmol/L (ref 22–32)
Calcium: 7.9 mg/dL — ABNORMAL LOW (ref 8.9–10.3)
Chloride: 101 mmol/L (ref 98–111)
Creatinine, Ser: 1.01 mg/dL — ABNORMAL HIGH (ref 0.44–1.00)
GFR, Estimated: >60 mL/min (ref >60)
Glucose, Bld: 91 mg/dL (ref 70–99)
Potassium: 4.5 mmol/L (ref 3.5–5.1)
Sodium: 139 mmol/L (ref 135–145)

## 2022-06-22 LAB — GLUCOSE, CAPILLARY
Glucose-Capillary: 94 mg/dL (ref 70–99)
Glucose-Capillary: 44 mg/dL — CL (ref 70–99)
Glucose-Capillary: 84 mg/dL (ref 70–99)
Glucose-Capillary: 354 mg/dL — ABNORMAL HIGH (ref 70–99)
Glucose-Capillary: 173 mg/dL — ABNORMAL HIGH (ref 70–99)
Glucose-Capillary: 179 mg/dL — ABNORMAL HIGH (ref 70–99)

## 2022-06-22 LAB — CBC
HCT: 26.3 % — ABNORMAL LOW (ref 36.0–46.0)
Hemoglobin: 8.9 g/dL — ABNORMAL LOW (ref 12.0–15.0)
MCH: 30.8 pg (ref 26.0–34.0)
MCHC: 33.8 g/dL (ref 30.0–36.0)
MCV: 91 fL (ref 80.0–100.0)
Platelets: 196 10*3/uL (ref 150–400)
RBC: 2.89 MIL/uL — ABNORMAL LOW (ref 3.87–5.11)
RDW: 12.7 % (ref 11.5–15.5)
WBC: 9.5 10*3/uL (ref 4.0–10.5)
nRBC: 0 % (ref 0.0–0.2)

## 2022-06-22 LAB — MAGNESIUM: Magnesium: 1.9 mg/dL (ref 1.7–2.4)

## 2022-06-22 LAB — PHOSPHORUS: Phosphorus: 3.3 mg/dL (ref 2.5–4.6)

## 2022-06-22 MED ORDER — INSULIN ASPART 100 UNIT/ML IJ SOLN: 4.0000 [IU] | Freq: Three times a day (TID) | INTRAMUSCULAR | Status: DC

## 2022-06-22 MED ORDER — INSULIN ASPART 100 UNIT/ML IJ SOLN
4.0000 [IU] | INTRAMUSCULAR | Status: DC
  Administered 2022-06-22 – 2022-06-23 (×3): 4 [IU] via SUBCUTANEOUS
  Filled 2022-06-22 (×3): qty 1

## 2022-06-22 NOTE — Progress Notes (Signed)
Progress Note   Patient: Ashley Camacho [id number] DOB: Oct 04, 1990 DOA: 06/09/2022     13 DOS: the patient was seen and examined on 06/22/2022   Brief hospital course: 32 year old female brought to the emergency room for weakness, vomiting and lethargy.  She was found to be in diabetic ketoacidosis.  She also has a history of depression and stopped taking her medication secondary to cost.  Patient has a history of adrenal insufficiency and diabetes.  1/18.  Patient interested in having a place to stay to help take care of her.  Case discussed with psychiatry to see the patient in consultation.  Tapering off insulin drip to long-acting insulin. 1/19.  Patient agreeable to Dobbhoff feeding tube.  IR placed but it still in the stomach at this point.  Will need an x-ray tomorrow to confirm that it is advanced before using. 1/20.  Dobbhoff tube has not advanced through the stomach despite 3 x-rays.  Case discussed with general surgery Dr. Dahlia Byes and he will take to the operating room for a jejunostomy tube tomorrow.  Dobbhoff tube removed. 1/21.  low sugar this morning requiring a amp of D50.  Jejunostomy tube placed by Dr. Dahlia Byes. 1/22.  Blood pressure on the lower side.  Will give a fluid bolus, restart stress dose steroids and give a unit of blood for hemoglobin of 7.4.  Hemoglobin after transfusion went up to 11.2 which is probably too high for 1 unit of blood. 1/23.  Patient feeling a little bit better.  Will taper stress dose steroids and hopefully switch over to Cortef for tomorrow.  Patient did have some intermittent vomiting yesterday.  Up to 30 mL an hour with tube feeds.  1/24: pt reports feeling miserable with recurrent N/V and abdominal discomfort.  Tube feeds just increased 40 >> 50 cc/hr.  Request RN reduce rate to see if she feels better. 1/25: stopped Reglan, increased erythromycin, reduced TF rate to 30 cc/hr.  Given IV Lasix for dependent edema. 1/26: pt eating a sandwich, N/V  improved today.  Notable improvement.  Given IV Lasix again. 1/28: no N/V past couple days, tolerating meals much better, requesting Glucerna shakes. Agreeable to resume mirtazapine for sleep, anxiety and appetite stimulation. 1/29: transition to nocturnal feeds tonight. She continues to do well.  Remeron helped w sleep. 1/30: hypoglycemic this AM (44) after being given insulin with glucose only 94 early AM.  Repeat 84, with tube feeds still running. Transition to nocturnal feeds did not occur last night.  Assessment and Plan: * Hypotension Improved with stress dose steroids, blood transfusion and fluids.  Tapered off stress dose steroids.  Resumed on Cortef. Monitor BP.  DKA (diabetic ketoacidosis) ([place]) Type 1 diabetes with DKA on admission.  Sugars have been labile since admission, more steady since tolerating PO intake better.  Requiring frequent regimen adjustments as tube feeds are transitioned to nocturnal --Continue 5 units Levemir qHS --Novolog 4 units @ 2000, 0000, 0400 to cover nocturnal feeds --Sliding scale Novolog 0-6 and CBG's Q4H --Adjust for goal 140-180  Gastroparesis Diabetic gastroparesis.  Continue trial of Reglan and erythromycin (limit erythromycin to 2 weeks). GI recommendations from 1/18-19 reviewed.   She has persistent N/V and abdominal pain on tube feeds. 1/26: since stopping Reglan yesterday and increasing erythromycin, reducing tube feed rate, she feels much better. Actually feels like eating for the first time. 1/27>>29: continues to do well, tolerating meals, no N/V. --Discussed with GI (1/25), appreciate input --Stopped Reglan (hx of this not working  for her & could be contributing to N/V)  --Increased erythromycin to 100 mg TID, can go up to 250 mg if needed. --May ultimately benefit from tertiary center referral for consideration of G-POEM procedure --Advised patient keep food diary & learn which foods are best tolerated, and look at [facility]  patient info PDF on gastroparesis diet  Acute kidney injury superimposed on chronic kidney disease ([place]) Acute kidney injury on CKD likely stage II.  Creatinine 2.58 on presentation and down to 1.38 at its lowest point.   Cr today 1.15 >> 1.16... 3.82  Acute metabolic encephalopathy-resolved as of 06/17/2022 Resolved. Secondary to diabetic ketoacidosis.    Protein-calorie malnutrition, severe Jejunostomy tube placed on 1/21 by Dr. Dahlia Byes.  Started on tube feeds. --Appreciate Dietitian's recs 1/24: pt reports increased abdominal pain and N/V with feed rate increased.  Trial today back rate off to 30 cc/hr 1/25: continue lower TF rate 30 cc/hr. 1/26: RD to change TF formula to concentrated so she can get adequate calories at lower feed rate 1/27>>28: at goal 45 cc/hr TF's, tolerating meals well past couple of days, no N/V 1/29: transition to nocturnal feeds in prep for d/c 1/30: nursing to educate pt on mgmt of tube feeds & transitioning to nocturnal feeds, adjusting insulin accordingly --Added Remeron for appetite stimulant, mood disorder/s and sleep. --Will transition to nocturnal feeds tonight. Monitor for tolerance as this will be more feasible at home --Requested nurses to teach patient how to manage her tube feeds and pump  Hypophosphatemia Refeeding Syndrome Replaced on 1/22, 1/26, 1/27 1/28 given IV phos-Nak 1/29 phos 4.1 1/30 phos 3.3 Monitor phos level daily until stable   Hypomagnesemia Replaced & resolved. Continue to monitor.  Adrenal insufficiency ([place]) Initially required stress dose steroids.  Transitioned to PO Cortef on 1/24.  Thrombocytopenia (HCC) Appears chronic.  Monitor CBC. Platelets 129k.  Chronic combined systolic and diastolic CHF (congestive heart failure) ([place]) Last echo 02/27/2021 -- EF 50-55% and normal diastolic parameters, apparently prior EF as low as 30%. --Monitor volume status with strict I/O's and daily weights --Given IV Lasix 20 mg 1/22,  1/25, 1/26, 1/28  --Further diuresis as needed, assess daily  Pseudohyponatremia Sodium initially low secondary to diabetic ketoacidosis.  Last sodium in the normal range.  Major depressive disorder, recurrent episode, moderate (HCC) Seen by psychiatry. They started Prozac. Added Remeron at bedtime to help with mood, sleep and appetite stimulation.  Hypothyroidism TSH slightly high.  Restarted levothyroxine at a slightly higher dose.  Depression Appreciate psychiatric consultation.  Prozac started. Resumed on Remeron for sleep and appetite as well.    Insomnia She used to be on Remeron for depression/anxiety and appetite stimulant. Appears not on this PTA, unclear when she last was taking it, she does not recall. --Start Remeron 15 mg at bedtime --As needed melatonin  Underweight BMI 17.83 History of eating disorder as well. Now with J tube, attempting to optimize nutrition status, but limited by recurrent abdominal pain and N/V.  Anemia of chronic disease Status post transfusion of 2 units pRBC's  (on admission, and on 1/22) earlier this admission.  Ferritin elevated at 1511.   Last Hbg 8.9 Monitor CBC          Subjective: Patient awake sitting up in bed today. She hypoglycemia this Am after given isulin with sugar of only 94 earlier.  Now recovering okay, but sugars will low/normal.  She is otherwise doing okay. No N/V. Tolerating feeds at 55/hr right now.  Physical Exam: Vitals:   06/22/22 0435 06/22/22 0712 06/22/22 1046 06/22/22 1105  BP: 124/82 118/74  136/84  Pulse: 80 82  90  Resp: 17 17  18   Temp: 97.9 F (36.6 C) 97.6 F (36.4 C)  98.4 F (36.9 C)  TempSrc:  Oral  Oral  SpO2: 100% 100%  100%  Weight:   44.4 kg   Height:       General exam: awake, alert, no acute distress, frail and chronically ill-appearing, underweight HEENT: moist mucus membranes, hearing grossly normal  Respiratory system: on room air, normal respiratory  effort. Cardiovascular system: RRR, no pedal edema.   Gastrointestinal system: soft, NT, ND, J tube with continuous tube feeds at 55 cc/hr Central nervous system: A&O x 3. no gross focal neurologic deficits, normal speech Extremities: moves all , no edema, normal tone Skin: dry, intact, normal temperature Psychiatry: normal mood, congruent affect      Data Reviewed:  Notable labs -- Cr 1.01.  Normal Na, K, Mg and phos today.  Hbg 8.9 (9.7 on 1/26)   Disposition: Status is: Inpatient Remains inpatient appropriate because: remains on tube feeds, transitioning to nocturnal feeds in prep for discharge.  Hypoglycemic episodes needing frequent insulin titration.    Planned Discharge Destination:  Group home placement vs home    Time spent: 42 minutes  Author: Ezekiel Slocumb, DO 06/22/2022 12:26 PM  For on call review [email or web address].

## 2022-06-22 NOTE — Inpatient Diabetes Management (Signed)
Inpatient Diabetes Program Recommendations  AACE/ADA: New Consensus Statement on Inpatient Glycemic Control   Target Ranges:  Prepandial:   less than 140 mg/dL      Peak postprandial:   less than 180 mg/dL (1-2 hours)      Critically ill patients:  140 - 180 mg/dL    Latest Reference Range & Units 06/21/22 04:07 06/21/22 9:57 06/21/22 11:59 06/21/22 16:29 06/21/22 16:59 06/21/22 20:08 06/21/22 23:49 06/22/22 04:32  Glucose-Capillary 70 - 99 mg/dL 175 (H)  Novolog 4 units   Novolog 4 units 174 (H)  Novolog 4 units 56 (L) 70 477 (H)  Novolog 9 units  Semglee 5 units 328 (H)  Novolog 7 units 94  Novolog 3 units   Review of Glycemic Control  Diabetes history: DM1 Outpatient Diabetes medications: Lantus 6 units daily, Novolog correction and carb coverage Current orders for Inpatient glycemic control: Semglee 5 units QHS, Novolog 3 units Q4H, Novolog 0-6 units Q4H; Cortef 20 mg QAM, Cortef 10 mg QPM, Glucerna 237 ml TID, Osmolite @ 75 ml/hr (nocturnal feeds for 12 hours)  Inpatient Diabetes Program Recommendations:    Insulin: Since tube feeds have changed to nocturnal feeds, consider changing tube feeding coverage to Novolog 4 units to TID (20:00, 00:00, and 4:00) so it is given when patient is receiving nocturnal feeds.   Thanks, Barnie Alderman, RN, MSN, [place] Diabetes Coordinator Inpatient Diabetes Program [phone] (Team Pager from 8am to [place])

## 2022-06-22 NOTE — Assessment & Plan Note (Addendum)
See DKA for mgmt

## 2022-06-23 DIAGNOSIS — N189 Chronic kidney disease, unspecified: Secondary | ICD-10-CM | POA: Diagnosis not present

## 2022-06-23 DIAGNOSIS — N179 Acute kidney failure, unspecified: Secondary | ICD-10-CM | POA: Diagnosis not present

## 2022-06-23 DIAGNOSIS — E10649 Type 1 diabetes mellitus with hypoglycemia without coma: Secondary | ICD-10-CM

## 2022-06-23 DIAGNOSIS — I5042 Chronic combined systolic (congestive) and diastolic (congestive) heart failure: Secondary | ICD-10-CM | POA: Diagnosis not present

## 2022-06-23 DIAGNOSIS — D638 Anemia in other chronic diseases classified elsewhere: Secondary | ICD-10-CM | POA: Diagnosis not present

## 2022-06-23 DIAGNOSIS — K3184 Gastroparesis: Secondary | ICD-10-CM | POA: Diagnosis not present

## 2022-06-23 DIAGNOSIS — E2740 Unspecified adrenocortical insufficiency: Secondary | ICD-10-CM | POA: Diagnosis not present

## 2022-06-23 DIAGNOSIS — E43 Unspecified severe protein-calorie malnutrition: Secondary | ICD-10-CM | POA: Diagnosis not present

## 2022-06-23 LAB — BASIC METABOLIC PANEL
Anion gap: 3 — ABNORMAL LOW (ref 5–15)
BUN: 50 mg/dL — ABNORMAL HIGH (ref 6–20)
CO2: 28 mmol/L (ref 22–32)
Calcium: 8.2 mg/dL — ABNORMAL LOW (ref 8.9–10.3)
Chloride: 109 mmol/L (ref 98–111)
Creatinine, Ser: 0.97 mg/dL (ref 0.44–1.00)
GFR, Estimated: >60 mL/min (ref >60)
Glucose, Bld: 137 mg/dL — ABNORMAL HIGH (ref 70–99)
Potassium: 4.9 mmol/L (ref 3.5–5.1)
Sodium: 140 mmol/L (ref 135–145)

## 2022-06-23 LAB — GLUCOSE, CAPILLARY
Glucose-Capillary: 107 mg/dL — ABNORMAL HIGH (ref 70–99)
Glucose-Capillary: 113 mg/dL — ABNORMAL HIGH (ref 70–99)
Glucose-Capillary: 19 mg/dL — CL (ref 70–99)
Glucose-Capillary: 195 mg/dL — ABNORMAL HIGH (ref 70–99)
Glucose-Capillary: 200 mg/dL — ABNORMAL HIGH (ref 70–99)
Glucose-Capillary: 351 mg/dL — ABNORMAL HIGH (ref 70–99)
Glucose-Capillary: 408 mg/dL — ABNORMAL HIGH (ref 70–99)
Glucose-Capillary: 511 mg/dL (ref 70–99)

## 2022-06-23 LAB — PHOSPHORUS: Phosphorus: 2.3 mg/dL — ABNORMAL LOW (ref 2.5–4.6)

## 2022-06-23 LAB — GLUCOSE, RANDOM: Glucose, Bld: 535 mg/dL (ref 70–99)

## 2022-06-23 LAB — MAGNESIUM: Magnesium: 2.1 mg/dL (ref 1.7–2.4)

## 2022-06-23 MED ORDER — INSULIN ASPART 100 UNIT/ML IJ SOLN
15.0000 [IU] | Freq: Once | INTRAMUSCULAR | Status: AC
  Administered 2022-06-23: 15 [IU] via SUBCUTANEOUS
  Filled 2022-06-23: qty 1

## 2022-06-23 MED ORDER — INSULIN ASPART 100 UNIT/ML IJ SOLN
4.0000 [IU] | INTRAMUSCULAR | Status: DC
  Administered 2022-06-23: 4 [IU] via SUBCUTANEOUS
  Filled 2022-06-23: qty 1

## 2022-06-23 MED ORDER — LOPERAMIDE HCL 2 MG PO CAPS
2.0000 mg | ORAL_CAPSULE | ORAL | Status: DC | PRN
  Administered 2022-06-23 – 2022-06-24 (×3): 2 mg via ORAL
  Filled 2022-06-23 (×3): qty 1

## 2022-06-23 NOTE — Progress Notes (Signed)
Nutrition Follow-up  DOCUMENTATION CODES:   Underweight, Severe malnutrition in context of chronic illness  INTERVENTION:   -Continue Glucerna Shake po TID, each supplement provides 220 kcal and 10 grams of protein  -TF via j-tube:   Osmolite 1.5 @ 75 ml/hr over 16 hours   125 ml free water flush every 4 hours   Tube feeding regimen provides 1800 kcal (100% of needs), 75 grams of protein, and 914 ml of H2O.  Total free water: 1664 ml daily  NUTRITION DIAGNOSIS:   Severe Malnutrition related to social / environmental circumstances as evidenced by moderate muscle depletion, severe muscle depletion, moderate fat depletion, severe fat depletion, percent weight loss.  Ongoing  GOAL:   Patient will meet greater than or equal to 90% of their needs  Progressing   MONITOR:   PO intake, Supplement acceptance, Diet advancement  REASON FOR ASSESSMENT:   Consult Assessment of nutrition requirement/status, Calorie Count  ASSESSMENT:   Pt with PMH of type 1 DM, HTN, gastroparesis, anemia, and adrenal insufficiency admitted for evaluation of weakness and vomiting.  1/19- IR placed NGT 1/21- s/p open j-tube placement  Reviewed I/O's: +1.6 L x 24 hours and +14.3 L since admission   Pt sleeping soundly at time of visit.   Case discussed with MD and RN. Pt reached TF rate of 55 ml/hr last night. Pt was having frequent stools with TF also noted hypoglycemic event last night.   Pt's intake has improved. Noted meal completions 50-80%. Pt has been drinking Glucerna supplements and tolerating well.   Per TOC notes, plan for possible group home placement.   Labs reviewed: CBGS: 19-351 (inpatient orders for glycemic control are 0-6 units inuslin aspart every 4 hours, 4 units insulin aspart BID, and 5 units insulin glargine-yfgn daily).    Diet Order:   Diet Order             DIET SOFT Room service appropriate? Yes; Fluid consistency: Thin  Diet effective now                    EDUCATION NEEDS:   Education needs have been addressed  Skin:  Skin Assessment: Skin Integrity Issues: Skin Integrity Issues:: Incisions Incisions: closed abdomen s/p j-tube placement  Last BM:  06/23/22  Height:   Ht Readings from Last 1 Encounters:  06/10/22 5\' 1"  (1.549 m)    Weight:   Wt Readings from Last 1 Encounters:  06/23/22 45.1 kg    Ideal Body Weight:  47.7 kg  BMI:  Body mass index is 18.79 kg/m.  Estimated Nutritional Needs:   Kcal:  1650-1850  Protein:  80-95 grams  Fluid:  > 1.6 L    Loistine Chance, RD, LDN, [place] Registered Dietitian II Certified Diabetes Care and Education Specialist Please refer to [facility] for RD and/or RD on-call/weekend/after hours pager

## 2022-06-23 NOTE — Progress Notes (Signed)
Progress Note   Patient: Ashley Camacho WPY:[id number] DOB: 1990/12/01 DOA: 06/09/2022     14 DOS: the patient was seen and examined on 06/23/2022   Brief hospital course: 32 year old female brought to the emergency room for weakness, vomiting and lethargy.  She was found to be in diabetic ketoacidosis.  She also has a history of depression and stopped taking her medication secondary to cost.  Patient has a history of adrenal insufficiency and diabetes.  1/18.  Patient interested in having a place to stay to help take care of her.  Case discussed with psychiatry to see the patient in consultation.  Tapered off insulin drip to long-acting insulin. 1/19.  Patient agreeable to Dobbhoff feeding tube.  IR placed 1/20.  Dobbhoff tube has not advanced through the stomach on 3 x-rays.  General surgery consulted for jejunostomy tube placement.  Dobbhoff tube removed. 1/21.  Jejunostomy tube placed by Dr. Dahlia Byes. 1/22.  Fluid bolus for low BP;s & stress dose steroids. Transfused 1 units for Hbg 7.4.   1/23.  Patient feeling a little bit better.  Steroids tapered back to home Corteg.  Had some intermittent vomiting yesterday.  Up to 30 mL an hour with tube feeds by J-tube.  1/24: pt reports feeling miserable with recurrent N/V and abdominal discomfort.  Tube feeds just increased 40 >> 50 cc/hr.  Reduced TF rate to see if she feels better. 1/25: stopped Reglan, increased erythromycin, reduced TF rate to 30 cc/hr.  Given IV Lasix for dependent edema. 1/26: pt eating a sandwich, N/V improved today.  .  Given IV Lasix again. 1/28: no N/V past couple days, tolerating meals much better, requesting Glucerna shakes. Agreeable to resume mirtazapine for sleep, anxiety and appetite stimulation. 1/29: transition to nocturnal feeds tonight. She continues to do well.  Remeron helped w sleep. 1/30: hypoglycemic this AM (44) after being given insulin with glucose only 94 early AM.  Repeat 84, with tube feeds still running.  Transition to nocturnal feeds did not occur last night. 1/31: Nocturnal feeds started, she tolerated at 55 cc/hr (goal 75 cc/hr).  CBG very low at 19 this AM, recovered.  Insulin orders for tube feed coverage were adjusted.  Assessment and Plan: * Hypotension Improved with stress dose steroids, blood transfusion and fluids.  Tapered off stress dose steroids.  Resumed on Cortef. Monitor BP.  DKA (diabetic ketoacidosis) ([place]) Type 1 diabetes with DKA on admission.  Sugars have been labile since admission, more steady since tolerating PO intake better.  Requiring frequent regimen adjustments as tube feeds are transitioned to nocturnal --Continue 5 units Levemir qHS --Novolog tube feed coverage 4 units @ 2000, 0000 (hold if tube feeds off or not at goal rate) --Sliding scale Novolog 0-6 and CBG's Q4H --Adjust for goal 140-180 --Hypoglycemia protocol  Gastroparesis Diabetic gastroparesis.  Continue trial of Reglan and erythromycin (limit erythromycin to 2 weeks). GI recommendations from 1/18-19 reviewed.   She has persistent N/V and abdominal pain on tube feeds. 1/26: since stopping Reglan yesterday and increasing erythromycin, reducing tube feed rate, she feels much better. Actually feels like eating for the first time. 1/27>>31: continues to do well, tolerating meals, no N/V. --Discussed with GI (1/25), appreciate input --Stopped Reglan (hx of this not working for her & could be contributing to N/V)  --Increased erythromycin to 100 mg TID, can go up to 250 mg if needed. --May ultimately benefit from tertiary center referral for consideration of G-POEM procedure --Advised patient keep food diary & learn  which foods are best tolerated, and look at [facility] patient info PDF on gastroparesis diet  Acute kidney injury superimposed on chronic kidney disease ([place]) Acute kidney injury on CKD likely stage II.  Creatinine 2.58 on presentation and down to 1.38 at its lowest point.   Cr  today 1.15 >> 1.16... 6.30  Acute metabolic encephalopathy-resolved as of 06/17/2022 Resolved. Secondary to diabetic ketoacidosis.    Protein-calorie malnutrition, severe Jejunostomy tube placed on 1/21 by Dr. Dahlia Byes.  Started on tube feeds. --Appreciate Dietitian's recs 1/24>>29: started and titrated up tube feeds.  Started tolerating meals again, no N/V.  1/30: transitioning to nocturnal feeds, adjusting insulin accordingly --Added Remeron for appetite stimulant, mood disorder/s and sleep. --Will transition to nocturnal feeds tonight. Monitor for tolerance as this will be more feasible at home --Requested nurses to teach patient how to manage her tube feeds and pump  Hypophosphatemia Refeeding Syndrome Replaced on 1/22, 1/26, 1/27 1/28 given IV phos-Nak 1/29 phos 4.1 1/30 phos 3.3 Monitor phos level daily until stable   Hypomagnesemia Replaced & resolved. Continue to monitor.  Adrenal insufficiency ([place]) Initially required stress dose steroids.  Transitioned to PO Cortef on 1/24.  Thrombocytopenia (HCC) Appears chronic.  Monitor CBC. Platelets 129k.  Chronic combined systolic and diastolic CHF (congestive heart failure) ([place]) Last echo 02/27/2021 -- EF 50-55% and normal diastolic parameters, apparently prior EF as low as 30%. --Monitor volume status with strict I/O's and daily weights --Given IV Lasix 20 mg 1/22, 1/25, 1/26, 1/28  --Further diuresis as needed, assess daily  Pseudohyponatremia Sodium initially low secondary to diabetic ketoacidosis.  Last sodium in the normal range.  Major depressive disorder, recurrent episode, moderate (HCC) Seen by psychiatry. They started Prozac. Added Remeron at bedtime to help with mood, sleep and appetite stimulation.  Hypothyroidism TSH slightly high.  Restarted levothyroxine at a slightly higher dose.  Depression Appreciate psychiatric consultation.  Prozac started. Resumed on Remeron for sleep and appetite as well.     Insomnia She used to be on Remeron for depression/anxiety and appetite stimulant. Appears not on this PTA, unclear when she last was taking it, she does not recall. --Start Remeron 15 mg at bedtime --As needed melatonin  Underweight BMI 17.83 History of eating disorder as well. Now with J tube, attempting to optimize nutrition status, but limited by recurrent abdominal pain and N/V.  Anemia of chronic disease Status post transfusion of 2 units pRBC's  (on admission, and on 1/22) earlier this admission.  Ferritin elevated at 1511.   Last Hbg 8.9 Monitor CBC   Type 1 diabetes mellitus with hypoglycemia without coma ([place]) See DKA for mgmt         Subjective: Patient awake sitting up in bed today. She reports doing okay overall.  Had some increased nausea yesterday but did okay with breakfast today.  Tolerated nocturnal tube feeds running at 55 cc/hr.      Physical Exam: Vitals:   06/23/22 0440 06/23/22 0500 06/23/22 0733 06/23/22 1151  BP: (!) 142/80  123/71 (!) 160/95  Pulse: 84  99 85  Resp: 18   18  Temp: 97.9 F (36.6 C)  98.4 F (36.9 C) 98 F (36.7 C)  TempSrc: Oral     SpO2: 100%  100% 99%  Weight:  45.1 kg    Height:       General exam: awake, alert, no acute distress, frail and chronically ill-appearing, underweight HEENT: moist mucus membranes, hearing grossly normal  Respiratory system: on room air,  normal respiratory effort. Cardiovascular system: RRR, no pedal edema.   Gastrointestinal system: soft, NT, ND, J tube with continuous tube feeds at 55 cc/hr Central nervous system: A&O x 3. no gross focal neurologic deficits, normal speech Extremities: moves all , no edema, normal tone Skin: dry, intact, normal temperature Psychiatry: normal mood, congruent affect      Data Reviewed:  Notable labs -- normal BMP except glucose 137, Ca 8.2, gap 3, phos 2.3.     Disposition: Status is: Inpatient Remains inpatient appropriate because: remains on tube  feeds, transitioning to nocturnal feeds in prep for discharge.  Hypoglycemic episodes needing frequent insulin titration.    Planned Discharge Destination:  Group home placement vs home    Time spent: 42 minutes  Author: Ezekiel Slocumb, DO 06/23/2022 2:00 PM  For on call review [email or web address].

## 2022-06-23 NOTE — TOC Progression Note (Addendum)
Transition of Care [facility]) - Progression Note    Patient Details  Name: Ashley Camacho MRN: [id number] Date of Birth: 10-12-1990  [facility] CM/SW Contact  Gerilyn Pilgrim, LCSW Phone Number: 06/23/2022, 10:03 AM  Clinical Narrative:   [facility] to [facility] at [phone]. Spoke with Elberta Fortis who says he may be able to assist and is able to take her with a feeding tube. CSW will follow back up once Elberta Fortis has reviewed pt  2:38pm SW followed up with Janell Quiet, no updates yet for places able to take.   Anthony's phone number is [phone].   2:53pm  SW spoke with Melanee Spry states he only takes 50 and up for his group home.    Expected Discharge Plan:  (TBD) Barriers to Discharge: Continued Medical Work up  Expected Discharge Plan and Services In-house Referral: Development worker, community, Nutrition Discharge Planning Services: CM Consult   Living arrangements for the past 2 months: Single Family Home                                       Social Determinants of Health (SDOH) Interventions SDOH Screenings   Food Insecurity: No Food Insecurity (06/10/2022)  Housing: Low Risk  (06/10/2022)  Transportation Needs: No Transportation Needs (06/10/2022)  Utilities: Not At Risk (06/10/2022)  Depression (PHQ2-9): Low Risk  (11/11/2020)  Tobacco Use: Low Risk  (06/17/2022)    Readmission Risk Interventions    11/03/2021   10:37 AM 06/16/2021   12:30 PM 06/03/2021    4:26 PM  Readmission Risk Prevention Plan  Transportation Screening Complete Complete Complete  Medication Review Press photographer) Complete Complete Complete  PCP or Specialist appointment within 3-5 days of discharge Complete Complete Complete  HRI or [place] Not Complete  Not Complete  HRI or Home Care Consult Pt Refusal Comments NA  NA  SW Recovery Care/Counseling Consult Not Complete Complete Complete  SW Consult Not Complete Comments NA    Palliative  Care Screening Not Applicable Not Applicable Not [place] Not Applicable Not Applicable Not Applicable

## 2022-06-23 NOTE — Inpatient Diabetes Management (Signed)
Inpatient Diabetes Program Recommendations  AACE/ADA: New Consensus Statement on Inpatient Glycemic Control   Target Ranges:  Prepandial:   less than 140 mg/dL      Peak postprandial:   less than 180 mg/dL (1-2 hours)      Critically ill patients:  140 - 180 mg/dL    Latest Reference Range & Units 06/23/22 00:15 06/23/22 04:37 06/23/22 07:15  Glucose-Capillary 70 - 99 mg/dL 200 (H)  Novolog 5 units 113 (H)  Novolog 4 units 19 (LL)    Latest Reference Range & Units 06/22/22 04:32 06/22/22 07:22 06/22/22 07:53 06/22/22 12:41 06/22/22 15:47 06/22/22 20:08 06/22/22 22:47  Glucose-Capillary 70 - 99 mg/dL 94  Novolog 3 units 44 (LL) 84 354 (H)  Novolog 5 units 173 (H)  Novolog 1 units 179 (H)  Novolog 5 units     Semglee 5 units   Review of Glycemic Control  Diabetes history: DM1 Outpatient Diabetes medications: Lantus 6 units daily, Novolog correction and carb coverage Current orders for Inpatient glycemic control: Semglee 5 units QHS, Novolog 4 units TID (20:00, 00:00, 04:00), Novolog 0-6 units Q4H; Cortef 20 mg QAM, Cortef 10 mg QPM, Glucerna 237 ml TID, Osmolite @ 75 ml/hr (nocturnal feeds for 12 hours)  Inpatient Diabetes Program Recommendations:    Insulin: Tube feedings stopped around 6am and while they were infusing during the night the rate was at 55 ml/hr (not at goal rate yet). Please consider changing Novolog tube feeding coverage to BID (given at 20:00 and 00:00).   NOTE: CBG 19 mg/dl this morning. Estill Bamberg, RN reports that patient had tube feedings going last night at 55 ml/hr and they were stopped around 6am today; patient has not had any issues with vomiting during the night or this morning.   Thanks, Barnie Alderman, RN, MSN, [place] Diabetes Coordinator Inpatient Diabetes Program [phone] (Team Pager from 8am to [place])

## 2022-06-24 DIAGNOSIS — N179 Acute kidney failure, unspecified: Secondary | ICD-10-CM

## 2022-06-24 DIAGNOSIS — N189 Chronic kidney disease, unspecified: Secondary | ICD-10-CM

## 2022-06-24 LAB — CBC
HCT: 26 % — ABNORMAL LOW (ref 36.0–46.0)
Hemoglobin: 8.8 g/dL — ABNORMAL LOW (ref 12.0–15.0)
MCH: 31.1 pg (ref 26.0–34.0)
MCHC: 33.8 g/dL (ref 30.0–36.0)
MCV: 91.9 fL (ref 80.0–100.0)
Platelets: 195 10*3/uL (ref 150–400)
RBC: 2.83 MIL/uL — ABNORMAL LOW (ref 3.87–5.11)
RDW: 12.7 % (ref 11.5–15.5)
WBC: 11.5 10*3/uL — ABNORMAL HIGH (ref 4.0–10.5)
nRBC: 0 % (ref 0.0–0.2)

## 2022-06-24 LAB — BASIC METABOLIC PANEL
Anion gap: 7 (ref 5–15)
BUN: 59 mg/dL — ABNORMAL HIGH (ref 6–20)
CO2: 27 mmol/L (ref 22–32)
Calcium: 8.2 mg/dL — ABNORMAL LOW (ref 8.9–10.3)
Chloride: 107 mmol/L (ref 98–111)
Creatinine, Ser: 1.17 mg/dL — ABNORMAL HIGH (ref 0.44–1.00)
GFR, Estimated: >60 mL/min (ref >60)
Glucose, Bld: 90 mg/dL (ref 70–99)
Potassium: 4.9 mmol/L (ref 3.5–5.1)
Sodium: 141 mmol/L (ref 135–145)

## 2022-06-24 LAB — GLUCOSE, CAPILLARY
Glucose-Capillary: 119 mg/dL — ABNORMAL HIGH (ref 70–99)
Glucose-Capillary: 219 mg/dL — ABNORMAL HIGH (ref 70–99)
Glucose-Capillary: 262 mg/dL — ABNORMAL HIGH (ref 70–99)
Glucose-Capillary: 281 mg/dL — ABNORMAL HIGH (ref 70–99)
Glucose-Capillary: 344 mg/dL — ABNORMAL HIGH (ref 70–99)
Glucose-Capillary: 84 mg/dL (ref 70–99)

## 2022-06-24 LAB — MAGNESIUM: Magnesium: 2 mg/dL (ref 1.7–2.4)

## 2022-06-24 LAB — PHOSPHORUS: Phosphorus: 3.3 mg/dL (ref 2.5–4.6)

## 2022-06-24 MED ORDER — INSULIN ASPART 100 UNIT/ML IJ SOLN
4.0000 [IU] | Freq: Four times a day (QID) | INTRAMUSCULAR | Status: DC
  Administered 2022-06-24 – 2022-06-25 (×4): 4 [IU] via SUBCUTANEOUS
  Filled 2022-06-24 (×4): qty 1

## 2022-06-24 NOTE — Inpatient Diabetes Management (Signed)
Inpatient Diabetes Program Recommendations  AACE/ADA: New Consensus Statement on Inpatient Glycemic Control  Target Ranges:  Prepandial:   less than 140 mg/dL      Peak postprandial:   less than 180 mg/dL (1-2 hours)      Critically ill patients:  140 - 180 mg/dL    Latest Reference Range & Units 06/23/22 04:37 06/23/22 07:15 06/23/22 07:50 06/23/22 11:49 06/23/22 15:05 06/23/22 21:04 06/23/22 23:30 06/24/22 00:48 06/24/22 04:10  Glucose-Capillary 70 - 99 mg/dL 113 (H)  Novolog 4 units  19 (LL) 107 (H) 351 (H)  Novolog 5 units  195 (H)  Novolog 1 units  511 (HH)  Novolog 10  units @21 :22  Semglee 5 units 408 (H)  Novolog 15 units @23 :46 281 (H) 84   Review of Glycemic Control  Diabetes history: DM1 Outpatient Diabetes medications: Lantus 6 units daily, Novolog correction and carb coverage Current orders for Inpatient glycemic control: Semglee 5 units QHS, Novolog 4 units BID (20:00, 00:00 for tube feeding coverage), Novolog 0-6 units Q4H; Cortef 20 mg QAM, Cortef 10 mg QPM, Glucerna 237 ml TID, Osmolite @ 75 ml/hr (nocturnal feeds 16:00-8:00)  Inpatient Diabetes Program Recommendations:    Insulin: Please consider changing tube feeding coverage to Novolog 4 units QID to be given at 16:00, 20:00, 00:00, 04:00.  NOTE: Patient has DM1, is very sensitive to insulin, and requires insulin coverage for any carbohydrates (PO intake and for tube feedings). Tube feedings are ordered from 16:00-8:00. Glucose up to 511 mg/dl on 06/23/22 and patient given Novolog 10 units at 21:22 and another Novolog 5 units at 23:46. Per MAR, Novolog 4 units for tube feeding coverage was not given at 00:00 (per NP noted on [facility]). Glucose is extremely variable. Recommend adjusting Novolog tube feeding coverage to Novolog 4 units QID.   Thanks, Barnie Alderman, RN, MSN, [place] Diabetes Coordinator Inpatient Diabetes Program [phone] (Team Pager from 8am to [place])

## 2022-06-24 NOTE — TOC Progression Note (Signed)
Transition of Care [facility]) - Progression Note    Patient Details  Name: Ashley Camacho MRN: [id number] Date of Birth: January 03, 1991  Transition of Care [facility]) CM/SW Contact  Gerilyn Pilgrim, LCSW Phone Number: 06/24/2022, 11:51 AM  Clinical Narrative:   SW attempted to reach anthony at his group home to follow up on referral unable to reach and unable to leave VM. CSW will follow up later in day.     Expected Discharge Plan:  (TBD) Barriers to Discharge: Continued Medical Work up  Expected Discharge Plan and Services In-house Referral: Development worker, community, Nutrition Discharge Planning Services: CM Consult   Living arrangements for the past 2 months: Single Family Home                                       Social Determinants of Health (SDOH) Interventions SDOH Screenings   Food Insecurity: No Food Insecurity (06/10/2022)  Housing: Low Risk  (06/10/2022)  Transportation Needs: No Transportation Needs (06/10/2022)  Utilities: Not At Risk (06/10/2022)  Depression (PHQ2-9): Low Risk  (11/11/2020)  Tobacco Use: Low Risk  (06/17/2022)    Readmission Risk Interventions    11/03/2021   10:37 AM 06/16/2021   12:30 PM 06/03/2021    4:26 PM  Readmission Risk Prevention Plan  Transportation Screening Complete Complete Complete  Medication Review Press photographer) Complete Complete Complete  PCP or Specialist appointment within 3-5 days of discharge Complete Complete Complete  HRI or [place] Not Complete  Not Complete  HRI or Home Care Consult Pt Refusal Comments NA  NA  SW Recovery Care/Counseling Consult Not Complete Complete Complete  SW Consult Not Complete Comments NA    Palliative Care Screening Not Applicable Not Applicable Not [place] Not Applicable Not Applicable Not Applicable

## 2022-06-24 NOTE — Progress Notes (Signed)
Progress Note   Patient: Ashley Camacho LKG:[id number] DOB: [birth year] DOA: 06/09/2022     15 DOS: the patient was seen and examined on 06/24/2022   Brief hospital course: 32 year old female brought to the emergency room for weakness, vomiting and lethargy.  She was found to be in diabetic ketoacidosis.  She also has a history of depression and stopped taking her medication secondary to cost.  Patient has a history of adrenal insufficiency and diabetes.  1/18.  Patient interested in having a place to stay to help take care of her.  Case discussed with psychiatry to see the patient in consultation.  Tapered off insulin drip to long-acting insulin. 1/19.  Patient agreeable to Dobbhoff feeding tube.  IR placed 1/20.  Dobbhoff tube has not advanced through the stomach on 3 x-rays.  General surgery consulted for jejunostomy tube placement.  Dobbhoff tube removed. 1/21.  Jejunostomy tube placed by Dr. Dahlia Byes. 1/22.  Fluid bolus for low BP;s & stress dose steroids. Transfused 1 units for Hbg 7.4.   1/23.  Patient feeling a little bit better.  Steroids tapered back to home Corteg.  Had some intermittent vomiting yesterday.  Up to 30 mL an hour with tube feeds by J-tube.  1/24: pt reports feeling miserable with recurrent N/V and abdominal discomfort.  Tube feeds just increased 40 >> 50 cc/hr.  Reduced TF rate to see if she feels better. 1/25: stopped Reglan, increased erythromycin, reduced TF rate to 30 cc/hr.  Given IV Lasix for dependent edema. 1/26: pt eating a sandwich, N/V improved today.  .  Given IV Lasix again. 1/28: no N/V past couple days, tolerating meals much better, requesting Glucerna shakes. Agreeable to resume mirtazapine for sleep, anxiety and appetite stimulation. 1/29: transition to nocturnal feeds tonight. She continues to do well.  Remeron helped w sleep. 1/30: hypoglycemic this AM (44) after being given insulin with glucose only 94 early AM.  Repeat 84, with tube feeds still running.  Transition to nocturnal feeds did not occur last night. 1/31: Nocturnal feeds started, she tolerated at 55 cc/hr (goal 75 cc/hr).  CBG very low at 19 this AM, recovered.  Insulin orders for tube feed coverage were adjusted.  Assessment and Plan:  Resolved, hypotension Improved with stress dose steroids, blood transfusion and fluids.  Tapered off stress dose steroids.  Resumed on Cortef. Monitor BP.  DKA (diabetic ketoacidosis) ([place]) Type 1 diabetes with DKA on admission.  Sugars have been labile since admission, more steady since tolerating PO intake better.  Requiring frequent regimen adjustments as tube feeds are transitioned to nocturnal --Continue 5 units Levemir qHS --Novolog tube feed coverage 4 units @ 2000, 0000 (hold if tube feeds off or not at goal rate) --Sliding scale Novolog 0-6 and CBG's Q4H --Adjust for goal 140-180 --Hypoglycemia protocol  Gastroparesis Diabetic gastroparesis.  Continue trial of Reglan and erythromycin (limit erythromycin to 2 weeks). GI recommendations from 1/18-19 reviewed.   She has persistent N/V and abdominal pain on tube feeds. 1/26: since stopping Reglan yesterday and increasing erythromycin, reducing tube feed rate, she feels much better. Actually feels like eating for the first time. 1/27>>31: continues to do well, tolerating meals, no N/V. --Discussed with GI (1/25), appreciate input --Stopped Reglan (hx of this not working for her & could be contributing to N/V)  --Increased erythromycin to 100 mg TID, can go up to 250 mg if needed. --May ultimately benefit from tertiary center referral for consideration of G-POEM procedure --Advised patient keep food diary &  learn which foods are best tolerated, and look at [facility] patient info PDF on gastroparesis diet  Acute kidney injury superimposed on chronic kidney disease ([place]) Acute kidney injury on CKD likely stage II.  Creatinine 2.58 on presentation and down to 1.38 at its lowest point.    Cr today 1.15 >> 1.16... 9.37  Acute metabolic encephalopathy-resolved as of 06/17/2022 Resolved. Secondary to diabetic ketoacidosis.    Protein-calorie malnutrition, severe Jejunostomy tube placed on 1/21 by Dr. Dahlia Byes.  Started on tube feeds. --Appreciate Dietitian's recs 1/24>>29: started and titrated up tube feeds.  Started tolerating meals again, no N/V.  1/30: transitioning to nocturnal feeds, adjusting insulin accordingly --Added Remeron for appetite stimulant, mood disorder/s and sleep. --Will transition to nocturnal feeds tonight. Monitor for tolerance as this will be more feasible at home --Requested nurses to teach patient how to manage her tube feeds and pump F/U outpt for staple removal 2 weeks w Mr Olean Ree [facility] 06/14/2022 per general surgery Dr. Dahlia Byes.  Results hypophosphatemia Refeeding Syndrome Replaced on 1/22, 1/26, 1/27 1/28 given IV phos-Nak 1/29 phos 4.1 1/30 phos 3.3 Monitor phos level daily until stable  Resolved hypomagnesemia Replaced & resolved. Continue to monitor.  Adrenal insufficiency ([place]) Initially required stress dose steroids.  Transitioned to PO Cortef on 1/24.  Thrombocytopenia (HCC) Appears chronic.  Monitor CBC. Platelets 129k.  Chronic combined systolic and diastolic CHF (congestive heart failure) ([place]) Last echo 02/27/2021 -- EF 50-55% and normal diastolic parameters, apparently prior EF as low as 30%. --Monitor volume status with strict I/O's and daily weights --Given IV Lasix 20 mg 1/22, 1/25, 1/26, 1/28  --Further diuresis as needed, assess daily  Pseudohyponatremia Sodium initially low secondary to diabetic ketoacidosis.  Last sodium in the normal range.  Major depressive disorder, recurrent episode, moderate (HCC) Seen by psychiatry. They started Prozac. Added Remeron at bedtime to help with mood, sleep and appetite stimulation.  Hypothyroidism TSH slightly high.  Restarted levothyroxine at a slightly higher  dose.  Depression Appreciate psychiatric consultation.  Prozac started. Resumed on Remeron for sleep and appetite as well.    Insomnia She used to be on Remeron for depression/anxiety and appetite stimulant. Appears not on this PTA, unclear when she last was taking it, she does not recall. --Start Remeron 15 mg at bedtime --As needed melatonin  Underweight BMI 17.83 History of eating disorder as well. Now with J tube, attempting to optimize nutrition status, but limited by recurrent abdominal pain and N/V.  Anemia of chronic disease Status post transfusion of 2 units pRBC's  (on admission, and on 1/22) earlier this admission.  Ferritin elevated at 1511.   Last Hbg 8.9 Monitor CBC   Type 1 diabetes mellitus with hypoglycemia without coma ([place]) See DKA for mgmt       Subjective: Patient awake sitting up in bed today. She reports doing okay overall.  Had some increased nausea yesterday but did okay with breakfast today.  Tolerated nocturnal tube feeds running at 55 cc/hr.      Physical Exam: Vitals:   06/24/22 0400 06/24/22 0412 06/24/22 0905 06/24/22 1155  BP:  131/70 133/84 126/83  Pulse:  89 92 93  Resp:  18 20 (!) 23  Temp:  (!) 97.4 F (36.3 C) 97.7 F (36.5 C) 97.8 F (36.6 C)  TempSrc:      SpO2:  100% 100% 100%  Weight: 45.1 kg     Height:       General exam: awake, alert, no acute distress, frail  and chronically ill-appearing, underweight HEENT: moist mucus membranes, hearing grossly normal  Respiratory system: on room air, normal respiratory effort. Cardiovascular system: RRR, no pedal edema.   Gastrointestinal system: soft, NT, ND, J tube with continuous tube feeds at 55 cc/hr Central nervous system: A&O x 3. no gross focal neurologic deficits, normal speech Extremities: moves all , no edema, normal tone Skin: dry, intact, normal temperature Psychiatry: normal mood, congruent affect      Data Reviewed:  Notable labs -- normal BMP except glucose  137, Ca 8.2, gap 3, phos 2.3.     Disposition: Status is: Inpatient Remains inpatient appropriate because: remains on tube feeds, transitioning to nocturnal feeds in prep for discharge.  Hypoglycemic episodes needing frequent insulin titration.    Planned Discharge Destination:  Group home placement vs home    Time spent: 42 minutes  Author: Kayleen Memos, DO 06/24/2022 1:23 PM  For on call review [email or web address].

## 2022-06-24 NOTE — Progress Notes (Signed)
Mobility Specialist - Progress Note     06/24/22 1100  Mobility  Activity Ambulated independently in hallway;Stood at bedside;Dangled on edge of bed  Level of Assistance Independent  Assistive Device None  Distance Ambulated (ft) 160 ft  Activity Response Tolerated well  Mobility Referral Yes  $Mobility charge 1 Mobility   Pt resting in bed on RA upon entry. Pt STS and ambulates to hallway around NS with no AD for 1 lap. Pt returned to bed and left with needs in reach.   Loma Sender Mobility Specialist 06/24/22, 11:45 AM

## 2022-06-24 NOTE — Progress Notes (Signed)
Mobility Specialist - Progress Note     06/24/22 1400  Mobility  Activity Ambulated independently in hallway;Stood at bedside;Dangled on edge of bed  Level of Assistance Independent  Assistive Device None  Distance Ambulated (ft) 320 ft  Activity Response Tolerated well  Mobility Referral Yes  $Mobility charge 1 Mobility    [employer] Mobility Specialist 06/24/22, 2:20 PM

## 2022-06-25 DIAGNOSIS — N179 Acute kidney failure, unspecified: Secondary | ICD-10-CM | POA: Diagnosis not present

## 2022-06-25 DIAGNOSIS — N189 Chronic kidney disease, unspecified: Secondary | ICD-10-CM | POA: Diagnosis not present

## 2022-06-25 LAB — GLUCOSE, CAPILLARY
Glucose-Capillary: 140 mg/dL — ABNORMAL HIGH (ref 70–99)
Glucose-Capillary: 203 mg/dL — ABNORMAL HIGH (ref 70–99)
Glucose-Capillary: 227 mg/dL — ABNORMAL HIGH (ref 70–99)
Glucose-Capillary: 475 mg/dL — ABNORMAL HIGH (ref 70–99)
Glucose-Capillary: 61 mg/dL — ABNORMAL LOW (ref 70–99)
Glucose-Capillary: 75 mg/dL (ref 70–99)
Glucose-Capillary: 98 mg/dL (ref 70–99)

## 2022-06-25 LAB — PHOSPHORUS: Phosphorus: 3.2 mg/dL (ref 2.5–4.6)

## 2022-06-25 LAB — MAGNESIUM: Magnesium: 2.2 mg/dL (ref 1.7–2.4)

## 2022-06-25 MED ORDER — INSULIN ASPART 100 UNIT/ML IJ SOLN
0.0000 [IU] | INTRAMUSCULAR | Status: DC
  Administered 2022-06-26: 1 [IU] via SUBCUTANEOUS
  Administered 2022-06-26: 7 [IU] via SUBCUTANEOUS
  Administered 2022-06-26: 1 [IU] via SUBCUTANEOUS
  Administered 2022-06-26: 2 [IU] via SUBCUTANEOUS
  Administered 2022-06-26: 3 [IU] via SUBCUTANEOUS
  Administered 2022-06-26: 9 [IU] via SUBCUTANEOUS
  Administered 2022-06-27: 2 [IU] via SUBCUTANEOUS
  Administered 2022-06-27: 5 [IU] via SUBCUTANEOUS
  Administered 2022-06-27: 3 [IU] via SUBCUTANEOUS
  Administered 2022-06-27: 2 [IU] via SUBCUTANEOUS
  Administered 2022-06-28: 1 [IU] via SUBCUTANEOUS
  Administered 2022-06-28: 3 [IU] via SUBCUTANEOUS
  Filled 2022-06-25 (×12): qty 1

## 2022-06-25 MED ORDER — INSULIN ASPART 100 UNIT/ML IJ SOLN
9.0000 [IU] | Freq: Once | INTRAMUSCULAR | Status: AC
  Administered 2022-06-25: 9 [IU] via SUBCUTANEOUS
  Filled 2022-06-25: qty 1

## 2022-06-25 MED ORDER — INSULIN ASPART 100 UNIT/ML IJ SOLN
4.0000 [IU] | Freq: Three times a day (TID) | INTRAMUSCULAR | Status: DC
  Administered 2022-06-25 – 2022-06-29 (×7): 4 [IU] via SUBCUTANEOUS
  Filled 2022-06-25 (×8): qty 1

## 2022-06-25 NOTE — Progress Notes (Signed)
Patient with AM blood glucose of 61. Asymptomatic. Given juice to drink.

## 2022-06-25 NOTE — TOC Progression Note (Addendum)
Transition of Care [facility]) - Progression Note    Patient Details  Name: ALANTA SCOBEY MRN: [id number] Date of Birth: 1991-01-29  [facility]) CM/SW Contact  Gerilyn Pilgrim, LCSW Phone Number: 06/25/2022, 1:52 PM  Clinical Narrative:     Multiple calls made to [facility] unable to leave message. CSW unable to reach. Mittie Moore also working on placement at a group home.   3:09pm  LVM with Janetta Hora [phone] with local group home to see if she is able to accept. SW spoke with [place] [facility]ers [phone] who states he does not have any female openings for group homes currently. SW spoke with Dyanne Iha [phone] who states that he cannot help as he only takes elderly patients.   Expected Discharge Plan:  (TBD) Barriers to Discharge: Continued Medical Work up  Expected Discharge Plan and Services In-house Referral: Development worker, community, Nutrition Discharge Planning Services: CM Consult   Living arrangements for the past 2 months: Single Family Home                                       Social Determinants of Health (SDOH) Interventions SDOH Screenings   Food Insecurity: No Food Insecurity (06/10/2022)  Housing: Low Risk  (06/10/2022)  Transportation Needs: No Transportation Needs (06/10/2022)  Utilities: Not At Risk (06/10/2022)  Depression (PHQ2-9): Low Risk  (11/11/2020)  Tobacco Use: Low Risk  (06/17/2022)    Readmission Risk Interventions    11/03/2021   10:37 AM 06/16/2021   12:30 PM 06/03/2021    4:26 PM  Readmission Risk Prevention Plan  Transportation Screening Complete Complete Complete  Medication Review Press photographer) Complete Complete Complete  PCP or Specialist appointment within 3-5 days of discharge Complete Complete Complete  HRI or [place] Not Complete  Not Complete  HRI or Home Care Consult Pt Refusal Comments NA  NA  SW Recovery Care/Counseling Consult Not Complete Complete Complete  SW Consult Not  Complete Comments NA    Palliative Care Screening Not Applicable Not Applicable Not [place] Not Applicable Not Applicable Not Applicable

## 2022-06-25 NOTE — Progress Notes (Signed)
Progress Note   Patient: Ashley Camacho WUJ:[id number] DOB: August 22, 1990 DOA: 06/09/2022     16 DOS: the patient was seen and examined on 06/25/2022   Brief hospital course: 32 year old female brought to the emergency room for weakness, vomiting and lethargy.  She was found to be in diabetic ketoacidosis.  She also has a history of depression and stopped taking her medication secondary to cost.  Patient has a history of adrenal insufficiency and diabetes.  1/18.  Patient interested in having a place to stay to help take care of her.  Case discussed with psychiatry to see the patient in consultation.  Tapered off insulin drip to long-acting insulin. 1/19.  Patient agreeable to Dobbhoff feeding tube.  IR placed 1/20.  Dobbhoff tube has not advanced through the stomach on 3 x-rays.  General surgery consulted for jejunostomy tube placement.  Dobbhoff tube removed. 1/21.  Jejunostomy tube placed by Dr. Dahlia Byes. 1/22.  Fluid bolus for low BP;s & stress dose steroids. Transfused 1 units for Hbg 7.4.   1/23.  Patient feeling a little bit better.  Steroids tapered back to home Corteg.  Had some intermittent vomiting yesterday.  Up to 30 mL an hour with tube feeds by J-tube.  1/24: pt reports feeling miserable with recurrent N/V and abdominal discomfort.  Tube feeds just increased 40 >> 50 cc/hr.  Reduced TF rate to see if she feels better. 1/25: stopped Reglan, increased erythromycin, reduced TF rate to 30 cc/hr.  Given IV Lasix for dependent edema. 1/26: pt eating a sandwich, N/V improved today.  .  Given IV Lasix again. 1/28: no N/V past couple days, tolerating meals much better, requesting Glucerna shakes. Agreeable to resume mirtazapine for sleep, anxiety and appetite stimulation. 1/29: transition to nocturnal feeds tonight. She continues to do well.  Remeron helped w sleep. 1/30: hypoglycemic this AM (44) after being given insulin with glucose only 94 early AM.  Repeat 84, with tube feeds still running.  Transition to nocturnal feeds did not occur last night. 1/31: Nocturnal feeds started, she tolerated at 55 cc/hr (goal 75 cc/hr).  CBG very low at 19 this AM, recovered.  Insulin orders for tube feed coverage were adjusted.  06/25/2022: The patient was seen and examined at her bedside.  She continues to have labile blood sugars.  CBG 61 this morning.  Assessment and Plan:  Resolved, hypotension BP is now at goal.  She is on Cortef.  DKA (diabetic ketoacidosis) ([place]) Type 1 diabetes with DKA on admission.  Sugars have been labile since admission, more steady since tolerating PO intake better.  Requiring frequent regimen adjustments as tube feeds are transitioned to nocturnal --Continue 5 units Levemir qHS NovoLog 4 units 3 times a day. --Adjust for goal 140-180 --Hypoglycemia protocol  Gastroparesis Currently on erythromycin --Increased erythromycin to 100 mg TID, can go up to 250 mg if needed. --May ultimately benefit from tertiary center referral for consideration of G-POEM procedure --Advised patient keep food diary & learn which foods are best tolerated, and look at [facility] patient info PDF on gastroparesis diet  Resolving acute kidney injury superimposed on chronic kidney disease ([place]) Acute kidney injury on CKD likely stage II.  Creatinine 2.58 on presentation and down to 1.1 with GFR greater than 60.  Acute metabolic encephalopathy-resolved as of 06/17/2022 Resolved. Secondary to diabetic ketoacidosis.    Protein-calorie malnutrition, severe Jejunostomy tube placed on 1/21 by Dr. Dahlia Byes.  Started on tube feeds. --Appreciate Dietitian's recs 1/24>>29: started and titrated up  tube feeds.  Started tolerating meals again, no N/V.  1/30: transitioning to nocturnal feeds, adjusting insulin accordingly --Added Remeron for appetite stimulant, mood disorder/s and sleep. --Will transition to nocturnal feeds tonight. Monitor for tolerance as this will be more feasible at  home --Requested nurses to teach patient how to manage her tube feeds and pump F/U outpt for staple removal 2 weeks w [facility] 06/14/2022 per general surgery Dr. Dahlia Byes. Please contact general surgery if the patient is still here on 06/28/2022 for removal of staples.  Resolved, post repletion hypophosphatemia Refeeding Syndrome Replaced on 1/22, 1/26, 1/27 1/28 given IV phos-Nak 1/29 phos 4.1 1/30 phos 3.3 Phosphorus level stable as of 06/25/2022.  Resolved hypomagnesemia Magnesium level stable as of 06/25/2022.  Adrenal insufficiency ([place]) Initially required stress dose steroids.  Transitioned to PO Cortef on 1/24.  Resolved thrombocytopenia (HCC) Platelets 129k>> 195 K  Chronic diastolic CHF Last echo 15/08/84 -- EF 50-55% and normal diastolic parameters, apparently prior EF as low as 30%. --Monitor volume status with strict I/O's and daily weights --Given IV Lasix 20 mg 1/22, 1/25, 1/26, 1/28  --Further diuresis as needed, assess daily  Resolved pseudohyponatremia Sodium initially low secondary to diabetic ketoacidosis.  Last sodium in the normal range.  Major depressive disorder, recurrent episode, moderate (HCC) Seen by psychiatry. They started Prozac. Remeron at bedtime to help with mood, sleep and appetite stimulation.  Hypothyroidism TSH slightly high.  Restarted levothyroxine at a slightly higher dose.  Insomnia She used to be on Remeron for depression/anxiety and appetite stimulant. Appears not on this PTA, unclear when she last was taking it, she does not recall. --Start Remeron 15 mg at bedtime --As needed melatonin  Underweight BMI 17.83 History of eating disorder as well. Now with J tube, attempting to optimize nutrition status, but limited by recurrent abdominal pain and N/V.  Anemia of chronic disease Status post transfusion of 2 units pRBC's  (on admission, and on 1/22) earlier this admission.  Ferritin elevated at 1511.   Hemoglobin 8.8.  No overt  bleeding reported.   Type 1 diabetes mellitus with hypoglycemia without coma ([place]) See DKA for mgmt    Physical Exam: Vitals:   06/24/22 2210 06/25/22 0422 06/25/22 0730 06/25/22 1604  BP: (!) 156/81 (!) 146/82 129/75 121/79  Pulse: 91 88 96 80  Resp: 16 18 16 16   Temp: (!) 97.5 F (36.4 C) 98.4 F (36.9 C) 97.9 F (36.6 C) 98 F (36.7 C)  TempSrc:      SpO2: 100% 100% 100% 100%  Weight:      Height:       General exam: Frail appearing in no acute distress.  She is alert oriented x 3.   HEENT: moist mucus membranes, hearing grossly normal  Respiratory system: Clear to auscultation no wheezes or rales. Cardiovascular system: Regular rate and rhythm no rubs or gallops. Gastrointestinal system: Soft noted normal bowel sounds, J tube with continuous tube feeds at 55 cc/hr Central nervous system: A&O x 3. no gross focal neurologic deficits, normal speech Extremities: moves all , no edema, normal tone Skin: dry, intact, normal temperature Psychiatry: Mood is appropriate for condition and setting.      Data Reviewed:  Notable labs -- normal BMP except glucose 137, Ca 8.2, gap 3, phos 2.3.     Disposition: Status is: Inpatient Remains inpatient appropriate because: remains on tube feeds, transitioning to nocturnal feeds in prep for discharge.  Hypoglycemic episodes needing frequent insulin titration.  Planned Discharge Destination:  Group home placement vs home    Time spent: 42 minutes  Author: Kayleen Memos, DO 06/25/2022 5:22 PM  For on call review [email or web address].

## 2022-06-25 NOTE — Inpatient Diabetes Management (Signed)
Inpatient Diabetes Program Recommendations  AACE/ADA: New Consensus Statement on Inpatient Glycemic Control   Target Ranges:  Prepandial:   less than 140 mg/dL      Peak postprandial:   less than 180 mg/dL (1-2 hours)      Critically ill patients:  140 - 180 mg/dL    Latest Reference Range & Units 06/24/22 07:45 06/24/22 11:50 06/24/22 15:57 06/24/22 20:13 06/25/22 00:04 06/25/22 04:24  Glucose-Capillary 70 - 99 mg/dL 119 (H) 344 (H) 262 (H) 219 (H) 140 (H) 98   Review of Glycemic Control  Diabetes history: DM1 Outpatient Diabetes medications: Lantus 6 units daily, Novolog correction and carb coverage Current orders for Inpatient glycemic control: Semglee 5 units QHS, Novolog 4 units QID (16:00,20:00, 00:00, 4:00 for tube feeding coverage), Novolog 0-6 units Q4H; Cortef 20 mg QAM, Cortef 10 mg QPM, Glucerna 237 ml TID, Osmolite @ 75 ml/hr (nocturnal feeds 16:00-8:00)  Inpatient Diabetes Program Recommendations:    Insulin: Please consider ordering Novolog 2 units BID (with breakfast and lunch) if patient is eating at least 50% of meals.  Thanks, Barnie Alderman, RN, MSN, [place] Diabetes Coordinator Inpatient Diabetes Program [phone] (Team Pager from 8am to [place])

## 2022-06-26 DIAGNOSIS — E1011 Type 1 diabetes mellitus with ketoacidosis with coma: Secondary | ICD-10-CM | POA: Diagnosis not present

## 2022-06-26 DIAGNOSIS — N1832 Chronic kidney disease, stage 3b: Secondary | ICD-10-CM | POA: Diagnosis not present

## 2022-06-26 DIAGNOSIS — I9589 Other hypotension: Secondary | ICD-10-CM | POA: Diagnosis not present

## 2022-06-26 DIAGNOSIS — E43 Unspecified severe protein-calorie malnutrition: Secondary | ICD-10-CM | POA: Diagnosis not present

## 2022-06-26 DIAGNOSIS — R571 Hypovolemic shock: Secondary | ICD-10-CM | POA: Diagnosis not present

## 2022-06-26 LAB — BASIC METABOLIC PANEL
Anion gap: 9 (ref 5–15)
Anion gap: 9 (ref 5–15)
Anion gap: 8 (ref 5–15)
BUN: 72 mg/dL — ABNORMAL HIGH (ref 6–20)
BUN: 77 mg/dL — ABNORMAL HIGH (ref 6–20)
BUN: 72 mg/dL — ABNORMAL HIGH (ref 6–20)
CO2: 23 mmol/L (ref 22–32)
CO2: 22 mmol/L (ref 22–32)
CO2: 27 mmol/L (ref 22–32)
Calcium: 8 mg/dL — ABNORMAL LOW (ref 8.9–10.3)
Calcium: 8.1 mg/dL — ABNORMAL LOW (ref 8.9–10.3)
Calcium: 8.5 mg/dL — ABNORMAL LOW (ref 8.9–10.3)
Chloride: 99 mmol/L (ref 98–111)
Chloride: 103 mmol/L (ref 98–111)
Chloride: 100 mmol/L (ref 98–111)
Creatinine, Ser: 1.28 mg/dL — ABNORMAL HIGH (ref 0.44–1.00)
Creatinine, Ser: 1.26 mg/dL — ABNORMAL HIGH (ref 0.44–1.00)
Creatinine, Ser: 1.13 mg/dL — ABNORMAL HIGH (ref 0.44–1.00)
GFR, Estimated: 57 mL/min — ABNORMAL LOW (ref >60)
GFR, Estimated: 59 mL/min — ABNORMAL LOW (ref >60)
GFR, Estimated: >60 mL/min (ref >60)
Glucose, Bld: 691 mg/dL (ref 70–99)
Glucose, Bld: 361 mg/dL — ABNORMAL HIGH (ref 70–99)
Glucose, Bld: 197 mg/dL — ABNORMAL HIGH (ref 70–99)
Potassium: 7.5 mmol/L (ref 3.5–5.1)
Potassium: 5.7 mmol/L — ABNORMAL HIGH (ref 3.5–5.1)
Potassium: 5.9 mmol/L — ABNORMAL HIGH (ref 3.5–5.1)
Sodium: 131 mmol/L — ABNORMAL LOW (ref 135–145)
Sodium: 134 mmol/L — ABNORMAL LOW (ref 135–145)
Sodium: 135 mmol/L (ref 135–145)

## 2022-06-26 LAB — CBC
HCT: 25.5 % — ABNORMAL LOW (ref 36.0–46.0)
Hemoglobin: 8.6 g/dL — ABNORMAL LOW (ref 12.0–15.0)
MCH: 31.5 pg (ref 26.0–34.0)
MCHC: 33.7 g/dL (ref 30.0–36.0)
MCV: 93.4 fL (ref 80.0–100.0)
Platelets: 167 10*3/uL (ref 150–400)
RBC: 2.73 MIL/uL — ABNORMAL LOW (ref 3.87–5.11)
RDW: 12.7 % (ref 11.5–15.5)
WBC: 10.8 10*3/uL — ABNORMAL HIGH (ref 4.0–10.5)
nRBC: 0 % (ref 0.0–0.2)

## 2022-06-26 LAB — GLUCOSE, CAPILLARY
Glucose-Capillary: 143 mg/dL — ABNORMAL HIGH (ref 70–99)
Glucose-Capillary: 150 mg/dL — ABNORMAL HIGH (ref 70–99)
Glucose-Capillary: 168 mg/dL — ABNORMAL HIGH (ref 70–99)
Glucose-Capillary: 248 mg/dL — ABNORMAL HIGH (ref 70–99)
Glucose-Capillary: 310 mg/dL — ABNORMAL HIGH (ref 70–99)
Glucose-Capillary: 329 mg/dL — ABNORMAL HIGH (ref 70–99)
Glucose-Capillary: 438 mg/dL — ABNORMAL HIGH (ref 70–99)
Glucose-Capillary: 574 mg/dL (ref 70–99)
Glucose-Capillary: 87 mg/dL (ref 70–99)
Glucose-Capillary: 88 mg/dL (ref 70–99)
Glucose-Capillary: 94 mg/dL (ref 70–99)

## 2022-06-26 MED ORDER — ALBUTEROL SULFATE (2.5 MG/3ML) 0.083% IN NEBU
2.5000 mg | INHALATION_SOLUTION | Freq: Once | RESPIRATORY_TRACT | Status: AC
  Administered 2022-06-26: 2.5 mg via RESPIRATORY_TRACT
  Filled 2022-06-26: qty 3

## 2022-06-26 MED ORDER — CALCIUM GLUCONATE-NACL 1-0.675 GM/50ML-% IV SOLN
1.0000 g | Freq: Once | INTRAVENOUS | Status: AC
  Administered 2022-06-26: 1000 mg via INTRAVENOUS
  Filled 2022-06-26: qty 50

## 2022-06-26 NOTE — Progress Notes (Signed)
Progress Note   Patient: Ashley Camacho VZD:[id number] DOB: December 19, 1990 DOA: 06/09/2022     17 DOS: the patient was seen and examined on 06/26/2022   Brief hospital course: 32 year old female brought to the emergency room for weakness, vomiting and lethargy.  She was found to be in diabetic ketoacidosis.  She also has a history of depression and stopped taking her medication secondary to cost.  Patient has a history of adrenal insufficiency and diabetes.  1/18.  Patient interested in having a place to stay to help take care of her.  Case discussed with psychiatry to see the patient in consultation.  Tapered off insulin drip to long-acting insulin. 1/19.  Patient agreeable to Dobbhoff feeding tube.  IR placed 1/20.  Dobbhoff tube has not advanced through the stomach on 3 x-rays.  General surgery consulted for jejunostomy tube placement.  Dobbhoff tube removed. 1/21.  Jejunostomy tube placed by Dr. Dahlia Byes. 1/22.  Fluid bolus for low BP;s & stress dose steroids. Transfused 1 units for Hbg 7.4.   1/23.  Patient feeling a little bit better.  Steroids tapered back to home Corteg.  Had some intermittent vomiting yesterday.  Up to 30 mL an hour with tube feeds by J-tube.  1/24: pt reports feeling miserable with recurrent N/V and abdominal discomfort.  Tube feeds just increased 40 >> 50 cc/hr.  Reduced TF rate to see if she feels better. 1/25: stopped Reglan, increased erythromycin, reduced TF rate to 30 cc/hr.  Given IV Lasix for dependent edema. 1/26: pt eating a sandwich, N/V improved today.  .  Given IV Lasix again. 1/28: no N/V past couple days, tolerating meals much better, requesting Glucerna shakes. Agreeable to resume mirtazapine for sleep, anxiety and appetite stimulation. 1/29: transition to nocturnal feeds tonight. She continues to do well.  Remeron helped w sleep. 1/30: hypoglycemic this AM (44) after being given insulin with glucose only 94 early AM.  Repeat 84, with tube feeds still running.  Transition to nocturnal feeds did not occur last night. 1/31: Nocturnal feeds started, she tolerated at 55 cc/hr (goal 75 cc/hr).  CBG very low at 19 this AM, recovered.  Insulin orders for tube feed coverage were adjusted.  06/25/2022: The patient was seen and examined at her bedside.  She continues to have labile blood sugars.  CBG 61 this morning.  06/26/22 : Patient seen and examined this morning. no overnight events.  Labs with hypokalemia with potassium of 5.9.  Patient scheduled to get nebulizer and calcium gluconate.  Patient blood glucose still in the labile range.  Continues to stay on tube feeding at night and p.o. feeds in the day  Assessment and Plan:  Resolved, hypotension BP is now at goal.  She is on Cortef.  DKA (diabetic ketoacidosis) ([place])  Type 1 diabetes with DKA on admission.  Sugars have been labile since admission, more steady since tolerating PO intake better.  Requiring frequent regimen adjustments as tube feeds are transitioned to nocturnal --Continue 5 units Levemir qHS NovoLog 4 units 3 times a day. --Adjust for goal 140-180 --Hypoglycemia protocol  Gastroparesis  Currently on erythromycin --Increased erythromycin to 100 mg TID, can go up to 250 mg if needed. --May ultimately benefit from tertiary center referral for consideration of G-POEM procedure --Advised patient keep food diary & learn which foods are best tolerated, and look at [facility] patient info PDF on gastroparesis diet  Resolving acute kidney injury superimposed on chronic kidney disease (HCC)-resolved Acute kidney injury on CKD likely  stage II.  Creatinine 2.58 on presentation and down to 1.1 with GFR greater than 60.  Acute metabolic encephalopathy-resolved as of 06/17/2022 Resolved. Secondary to diabetic ketoacidosis.    Protein-calorie malnutrition, severe Jejunostomy tube placed on 1/21 by Dr. Dahlia Byes.  Started on tube feeds. --Appreciate Dietitian's recs 1/24>>29: started and  titrated up tube feeds.  Started tolerating meals again, no N/V.  1/30: transitioning to nocturnal feeds, adjusting insulin accordingly --Added Remeron for appetite stimulant, mood disorder/s and sleep. --Will transition to nocturnal feeds tonight. Monitor for tolerance as this will be more feasible at home --Requested nurses to teach patient how to manage her tube feeds and pump F/U outpt for staple removal 2 weeks w Mr [facility] 06/14/2022 per general surgery Dr. Dahlia Byes. Please contact general surgery if the patient is still here on 06/28/2022 for removal of staples.  Resolved, post repletion hypophosphatemia Refeeding Syndrome Replaced on 1/22, 1/26, 1/27 1/28 given IV phos-Nak 1/29 phos 4.1 1/30 phos 3.3 Phosphorus level stable as of 06/25/2022.  Resolved hypomagnesemia Magnesium level stable as of 06/25/2022.  Adrenal insufficiency ([place]) Initially required stress dose steroids.  Transitioned to PO Cortef on 1/24.  Resolved thrombocytopenia (HCC) Platelets 129k>> 195 K  Chronic diastolic CHF Last echo 97/01/8920 -- EF 50-55% and normal diastolic parameters, apparently prior EF as low as 30%. --Monitor volume status with strict I/O's and daily weights --Given IV Lasix 20 mg 1/22, 1/25, 1/26, 1/28  --Further diuresis as needed, assess daily  Resolved pseudohyponatremia Sodium initially low secondary to diabetic ketoacidosis.  Last sodium in the normal range.  Major depressive disorder, recurrent episode, moderate (HCC) Seen by psychiatry. They started Prozac. Remeron at bedtime to help with mood, sleep and appetite stimulation.  Hypothyroidism TSH slightly high.  Restarted levothyroxine at a slightly higher dose.  Insomnia She used to be on Remeron for depression/anxiety and appetite stimulant. Appears not on this PTA, unclear when she last was taking it, she does not recall. --Start Remeron 15 mg at bedtime --As needed melatonin  Underweight BMI 17.83 History of eating  disorder as well. Now with J tube, attempting to optimize nutrition status, but limited by recurrent abdominal pain and N/V.  Anemia of chronic disease Status post transfusion of 2 units pRBC's  (on admission, and on 1/22) earlier this admission.  Ferritin elevated at 1511.   Hemoglobin 8.8.  No overt bleeding reported.   Type 1 diabetes mellitus with hypoglycemia without coma ([place]) See DKA for mgmt    Physical Exam: Vitals:   06/25/22 2104 06/26/22 0437 06/26/22 0518 06/26/22 0824  BP: (!) 167/83  134/78 (!) 141/85  Pulse: 81  90 88  Resp: 16  16 16   Temp: 98.6 F (37 C)   98 F (36.7 C)  TempSrc:      SpO2: 100%  100% 100%  Weight:  44.6 kg    Height:       General exam: Frail appearing in no acute distress.  She is alert oriented x 3.   HEENT: moist mucus membranes, hearing grossly normal  Respiratory system: Clear to auscultation no wheezes or rales. Cardiovascular system: Regular rate and rhythm no rubs or gallops. Gastrointestinal system: Soft noted normal bowel sounds, J tube with continuous tube feeds at 55 cc/hr Central nervous system: A&O x 3. no gross focal neurologic deficits, normal speech Extremities: moves all , no edema, normal tone Skin: dry, intact, normal temperature Psychiatry: Mood is appropriate for condition and setting.      Data Reviewed:  Notable labs -- normal BMP except glucose 137, Ca 8.2, gap 3, phos 2.3.     Disposition: Status is: Inpatient Remains inpatient appropriate because: remains on tube feeds, transitioning to nocturnal feeds in prep for discharge.  Hypoglycemic episodes needing frequent insulin titration.    Planned Discharge Destination:  Group home placement vs home    Time spent: 42 minutes  Author: Oran Rein, MD 06/26/2022 12:07 PM  For on call review [email or web address].

## 2022-06-26 NOTE — Progress Notes (Signed)
Reason for contact:  Looking to clarify insulin order.   The tube feeds stop at 0800 and the 0800 insulin order is for 4 units while tube feed is at goal.   Requesting to clarify if the 4 unit insulin is still to be given at 0800 when the tube feed is stopped.

## 2022-06-26 NOTE — Progress Notes (Signed)
       CROSS COVER NOTE  NAME: Ashley Camacho MRN: [id number] DOB : Oct 06, 1990    HPI/Events of Note   Blood sugar 475  Assessment and  Interventions   Assessment: Type 1 diabetes with labile blood sugars hypoglycemic of late.  CBG was 61 earlier in the day Plan: Subcu insulin ordered.  Stat BMP revealed blood sugar for 691, normal anion gap and potassium of 7.5.  Being repeated and stat EKG also ordered Addendum: Repeat BMP with blood Kos 361 with anion gap of 9, bicarb 22, potassium 5.7.  EKG with no changes.  Will continue to monitor X

## 2022-06-26 NOTE — Progress Notes (Addendum)
Notified Dr. Dwyane Dee that  potassium this morning is 5.7, it was critical in the 7 range at 2330, Glucose was 600+ critical. Last CBG was 88 at 0630.  Checked with MD if they would like to recheck potassium or another BMP. Patient is not in any distress.  Awaiting response   MD ordered BMP.

## 2022-06-26 NOTE — Progress Notes (Signed)
Dr. Dwyane Dee alerted that repeat BMP is showing a K of 5.9 up from 5.7.  No orders given.

## 2022-06-27 DIAGNOSIS — I9589 Other hypotension: Secondary | ICD-10-CM | POA: Diagnosis not present

## 2022-06-27 DIAGNOSIS — E1011 Type 1 diabetes mellitus with ketoacidosis with coma: Secondary | ICD-10-CM | POA: Diagnosis not present

## 2022-06-27 DIAGNOSIS — R571 Hypovolemic shock: Secondary | ICD-10-CM | POA: Diagnosis not present

## 2022-06-27 DIAGNOSIS — N1832 Chronic kidney disease, stage 3b: Secondary | ICD-10-CM | POA: Diagnosis not present

## 2022-06-27 DIAGNOSIS — E43 Unspecified severe protein-calorie malnutrition: Secondary | ICD-10-CM | POA: Diagnosis not present

## 2022-06-27 LAB — GLUCOSE, CAPILLARY
Glucose-Capillary: 207 mg/dL — ABNORMAL HIGH (ref 70–99)
Glucose-Capillary: 155 mg/dL — ABNORMAL HIGH (ref 70–99)
Glucose-Capillary: 66 mg/dL — ABNORMAL LOW (ref 70–99)
Glucose-Capillary: 108 mg/dL — ABNORMAL HIGH (ref 70–99)
Glucose-Capillary: 259 mg/dL — ABNORMAL HIGH (ref 70–99)
Glucose-Capillary: 227 mg/dL — ABNORMAL HIGH (ref 70–99)
Glucose-Capillary: 155 mg/dL — ABNORMAL HIGH (ref 70–99)
Glucose-Capillary: 174 mg/dL — ABNORMAL HIGH (ref 70–99)

## 2022-06-27 LAB — CBC
HCT: 24.1 % — ABNORMAL LOW (ref 36.0–46.0)
Hemoglobin: 8.2 g/dL — ABNORMAL LOW (ref 12.0–15.0)
MCH: 31.4 pg (ref 26.0–34.0)
MCHC: 34 g/dL (ref 30.0–36.0)
MCV: 92.3 fL (ref 80.0–100.0)
Platelets: 172 10*3/uL (ref 150–400)
RBC: 2.61 MIL/uL — ABNORMAL LOW (ref 3.87–5.11)
RDW: 12.8 % (ref 11.5–15.5)
WBC: 9.2 10*3/uL (ref 4.0–10.5)
nRBC: 0 % (ref 0.0–0.2)

## 2022-06-27 LAB — BASIC METABOLIC PANEL
Anion gap: 8 (ref 5–15)
BUN: 80 mg/dL — ABNORMAL HIGH (ref 6–20)
CO2: 27 mmol/L (ref 22–32)
Calcium: 8.6 mg/dL — ABNORMAL LOW (ref 8.9–10.3)
Chloride: 105 mmol/L (ref 98–111)
Creatinine, Ser: 1.29 mg/dL — ABNORMAL HIGH (ref 0.44–1.00)
GFR, Estimated: 57 mL/min — ABNORMAL LOW (ref >60)
Glucose, Bld: 71 mg/dL (ref 70–99)
Potassium: 4.8 mmol/L (ref 3.5–5.1)
Sodium: 140 mmol/L (ref 135–145)

## 2022-06-27 LAB — COMPREHENSIVE METABOLIC PANEL
ALT: 34 U/L (ref 0–44)
AST: 41 U/L (ref 15–41)
Albumin: 2.7 g/dL — ABNORMAL LOW (ref 3.5–5.0)
Alkaline Phosphatase: 68 U/L (ref 38–126)
Anion gap: 6 (ref 5–15)
BUN: 74 mg/dL — ABNORMAL HIGH (ref 6–20)
CO2: 27 mmol/L (ref 22–32)
Calcium: 8.3 mg/dL — ABNORMAL LOW (ref 8.9–10.3)
Chloride: 104 mmol/L (ref 98–111)
Creatinine, Ser: 1.23 mg/dL — ABNORMAL HIGH (ref 0.44–1.00)
GFR, Estimated: >60 mL/min (ref >60)
Glucose, Bld: 193 mg/dL — ABNORMAL HIGH (ref 70–99)
Potassium: 6.2 mmol/L — ABNORMAL HIGH (ref 3.5–5.1)
Sodium: 137 mmol/L (ref 135–145)
Total Bilirubin: 0.6 mg/dL (ref 0.3–1.2)
Total Protein: 5.2 g/dL — ABNORMAL LOW (ref 6.5–8.1)

## 2022-06-27 MED ORDER — INSULIN ASPART 100 UNIT/ML IV SOLN
6.0000 [IU] | Freq: Once | INTRAVENOUS | Status: AC
  Administered 2022-06-27: 6 [IU] via INTRAVENOUS
  Filled 2022-06-27: qty 0.06

## 2022-06-27 MED ORDER — CALCIUM GLUCONATE-NACL 1-0.675 GM/50ML-% IV SOLN
1.0000 g | Freq: Once | INTRAVENOUS | Status: AC
  Administered 2022-06-27: 1000 mg via INTRAVENOUS
  Filled 2022-06-27: qty 50

## 2022-06-27 MED ORDER — SODIUM ZIRCONIUM CYCLOSILICATE 10 G PO PACK
10.0000 g | PACK | Freq: Once | ORAL | Status: AC
  Administered 2022-06-27: 10 g via ORAL
  Filled 2022-06-27: qty 1

## 2022-06-27 MED ORDER — ALBUTEROL SULFATE (2.5 MG/3ML) 0.083% IN NEBU
INHALATION_SOLUTION | RESPIRATORY_TRACT | Status: AC
  Filled 2022-06-27: qty 3

## 2022-06-27 MED ORDER — ALBUTEROL SULFATE (2.5 MG/3ML) 0.083% IN NEBU
5.0000 mg | INHALATION_SOLUTION | Freq: Once | RESPIRATORY_TRACT | Status: AC
  Administered 2022-06-27: 5 mg via RESPIRATORY_TRACT
  Filled 2022-06-27: qty 6

## 2022-06-27 NOTE — Progress Notes (Signed)
Dr. Dwyane Dee notified of AM labs as follows:  K = 6.2 was 5.7 yesterday Ca = 8.3 was 8.5 yesterday.

## 2022-06-27 NOTE — Progress Notes (Signed)
. Progress Note   Patient: Ashley Camacho VWU:[id number] DOB: 1990/05/25 DOA: 06/09/2022     18 DOS: the patient was seen and examined on 06/27/2022   Brief hospital course: 32 year old female brought to the emergency room for weakness, vomiting and lethargy.  She was found to be in diabetic ketoacidosis.  She also has a history of depression and stopped taking her medication secondary to cost.  Patient has a history of adrenal insufficiency and diabetes.  1/18.  Patient interested in having a place to stay to help take care of her.  Case discussed with psychiatry to see the patient in consultation.  Tapered off insulin drip to long-acting insulin. 1/19.  Patient agreeable to Dobbhoff feeding tube.  IR placed 1/20.  Dobbhoff tube has not advanced through the stomach on 3 x-rays.  General surgery consulted for jejunostomy tube placement.  Dobbhoff tube removed. 1/21.  Jejunostomy tube placed by Dr. Dahlia Byes. 1/22.  Fluid bolus for low BP;s & stress dose steroids. Transfused 1 units for Hbg 7.4.   1/23.  Patient feeling a little bit better.  Steroids tapered back to home Corteg.  Had some intermittent vomiting yesterday.  Up to 30 mL an hour with tube feeds by J-tube.  1/24: pt reports feeling miserable with recurrent N/V and abdominal discomfort.  Tube feeds just increased 40 >> 50 cc/hr.  Reduced TF rate to see if she feels better. 1/25: stopped Reglan, increased erythromycin, reduced TF rate to 30 cc/hr.  Given IV Lasix for dependent edema. 1/26: pt eating a sandwich, N/V improved today.  .  Given IV Lasix again. 1/28: no N/V past couple days, tolerating meals much better, requesting Glucerna shakes. Agreeable to resume mirtazapine for sleep, anxiety and appetite stimulation. 1/29: transition to nocturnal feeds tonight. She continues to do well.  Remeron helped w sleep. 1/30: hypoglycemic this AM (44) after being given insulin with glucose only 94 early AM.  Repeat 84, with tube feeds still running.  Transition to nocturnal feeds did not occur last night. 1/31: Nocturnal feeds started, she tolerated at 55 cc/hr (goal 75 cc/hr).  CBG very low at 19 this AM, recovered.  Insulin orders for tube feed coverage were adjusted.  06/25/2022: The patient was seen and examined at her bedside.  She continues to have labile blood sugars.  CBG 61 this morning.  06/26/22 : Patient seen and examined this morning. no overnight events.  Labs with hyperkalemia with potassium of 5.9.  Patient scheduled to get nebulizer and calcium gluconate.  Patient blood glucose still in the labile range.  Continues to stay on tube feeding at night and p.o. feeds in the day  2/4 : Patient seen and examined this morning. no overnight events.  Labs with hyperkalemia with potassium of 6.2. Meds ordered with nebs. Will need intake adjustment by nutrition as there is no abnormality.   Assessment and Plan:  Resolved, hypotension BP is now at goal.  She is on Cortef.  DKA (diabetic ketoacidosis) ([place])  Type 1 diabetes with DKA on admission.  Sugars have been labile since admission, more steady since tolerating PO intake better.  Requiring frequent regimen adjustments as tube feeds are transitioned to nocturnal --Continue 5 units Levemir qHS NovoLog 4 units 3 times a day. --Adjust for goal 140-180 --Hypoglycemia protocol  Gastroparesis  Currently on erythromycin --Increased erythromycin to 100 mg TID, can go up to 250 mg if needed. 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  Creatinine 2.58 on presentation and down to 1.1 with GFR greater than 60.  Acute metabolic encephalopathy-resolved as of  06/17/2022 Resolved. Secondary to diabetic ketoacidosis.    Protein-calorie malnutrition, severe Jejunostomy tube placed on 1/21 by Dr. Dahlia Byes.  Started on tube feeds. --Appreciate Dietitian's recs 1/24>>29: started and titrated up tube feeds.  Started tolerating meals again, no N/V.  1/30: transitioning to nocturnal feeds, adjusting insulin accordingly --Added Remeron for appetite stimulant, mood disorder/s and sleep. --Will transition to nocturnal feeds tonight. Monitor for tolerance as this will be more feasible at home --Requested nurses to teach patient how to manage her tube feeds and pump F/U outpt for staple removal 2 weeks w Mr Olean Ree [facility] 06/14/2022 per general surgery Dr. Dahlia Byes. Please contact general surgery if the patient is still here on 06/28/2022 for removal of staples.  Resolved, post repletion hypophosphatemia Refeeding Syndrome Replaced on 1/22, 1/26, 1/27 1/28 given IV phos-Nak 1/29 phos 4.1 1/30 phos 3.3 Phosphorus level stable as of 06/25/2022.  Resolved hypomagnesemia Magnesium level stable as of 06/25/2022.  Adrenal insufficiency ([place]) Initially required stress dose steroids.  Transitioned to PO Cortef on 1/24.  Resolved thrombocytopenia (HCC) Platelets 129k>> 195 K  Chronic diastolic CHF Last echo 40/0/8676 -- EF 50-55% and normal diastolic parameters, apparently prior EF as low as 30%. --Monitor volume status with strict I/O's and daily weights --Given IV Lasix 20 mg 1/22, 1/25, 1/26, 1/28  --Further diuresis as needed, assess daily  Resolved pseudohyponatremia Sodium initially low secondary to diabetic ketoacidosis.  Last sodium in the normal range.  Major depressive disorder, recurrent episode, moderate (HCC) Seen by psychiatry. They started Prozac. Remeron at bedtime to help with mood, sleep and appetite stimulation.  Hypothyroidism TSH slightly high.  Restarted levothyroxine at a slightly higher dose.  Insomnia She used to be on Remeron for  depression/anxiety and appetite stimulant. Appears not on this PTA, unclear when she last was taking it, she does not recall. --Start Remeron 15 mg at bedtime --As needed melatonin  Underweight BMI 17.83 History of eating disorder as well. Now with J tube, attempting to optimize nutrition status, but limited by recurrent abdominal pain and N/V.  Anemia of chronic disease Status post transfusion of 2 units pRBC's  (on admission, and on 1/22) earlier this admission.  Ferritin elevated at 1511.   Hemoglobin 8.8.  No overt bleeding reported.   Type 1 diabetes mellitus with hypoglycemia without coma [facility]) See DKA for mgmt    Physical Exam: Vitals:   06/26/22 2013 06/27/22 0429 06/27/22 0500 06/27/22 0747  BP: (!) 149/76 (!) 142/81  (!) 146/78  Pulse: (!) 101 93  91  Resp: 17 18  18   Temp: 98.4 F (36.9 C) 98 F (36.7 C)  98.4 F (36.9 C)  TempSrc:      SpO2: 100% 100%  100%  Weight:   48.2 kg   Height:       General exam: Frail appearing in no acute distress.  She is alert oriented x 3.   HEENT: moist mucus membranes, hearing grossly normal  Respiratory system: equal air entry b/l  Cardiovascular system: Regular rate and rhythm no rubs or gallops. Gastrointestinal system: Soft noted normal bowel sounds, J tube with continuous tube feeds at 55 cc/hr Central  nervous system: A&O x 3. no gross focal neurologic deficits, normal speech Extremities: moves all , no edema, normal tone Skin: dry, intact, normal temperature Psychiatry: Mood is appropriate for condition and setting.      Data Reviewed:  Notable labs -- normal BMP except glucose 137, Ca 8.2, gap 3, phos 2.3.     Disposition: Status is: Inpatient Remains inpatient appropriate because: remains on tube feeds, transitioning to nocturnal feeds in prep for discharge.  Hypoglycemic episodes needing frequent insulin titration.    Planned Discharge Destination:  Group home placement vs home    Time spent: 42  minutes  Author: Oran Rein, MD 06/27/2022 9:02 AM  For on call review [email or web address].

## 2022-06-27 NOTE — Progress Notes (Signed)
Hypoglycemic Event  CBG: 66   Treatment: 4 oz juice/soda  Symptoms: None  Follow-up CBG: Time:1300 CBG Result:108  Possible Reasons for Event: Medication regimen: one time insulin order 6 units IVP  Comments/MD notified:yes    Fady Stamps S Laniesha Das

## 2022-06-28 DIAGNOSIS — Z934 Other artificial openings of gastrointestinal tract status: Secondary | ICD-10-CM

## 2022-06-28 DIAGNOSIS — K3184 Gastroparesis: Secondary | ICD-10-CM | POA: Diagnosis not present

## 2022-06-28 DIAGNOSIS — E43 Unspecified severe protein-calorie malnutrition: Secondary | ICD-10-CM | POA: Diagnosis present

## 2022-06-28 DIAGNOSIS — E1011 Type 1 diabetes mellitus with ketoacidosis with coma: Secondary | ICD-10-CM

## 2022-06-28 DIAGNOSIS — R571 Hypovolemic shock: Secondary | ICD-10-CM

## 2022-06-28 DIAGNOSIS — N1832 Chronic kidney disease, stage 3b: Secondary | ICD-10-CM | POA: Diagnosis not present

## 2022-06-28 LAB — BASIC METABOLIC PANEL
Anion gap: 8 (ref 5–15)
BUN: 69 mg/dL — ABNORMAL HIGH (ref 6–20)
CO2: 29 mmol/L (ref 22–32)
Calcium: 8.5 mg/dL — ABNORMAL LOW (ref 8.9–10.3)
Chloride: 104 mmol/L (ref 98–111)
Creatinine, Ser: 1.24 mg/dL — ABNORMAL HIGH (ref 0.44–1.00)
GFR, Estimated: 60 mL/min — ABNORMAL LOW (ref >60)
Glucose, Bld: 86 mg/dL (ref 70–99)
Potassium: 5.5 mmol/L — ABNORMAL HIGH (ref 3.5–5.1)
Sodium: 141 mmol/L (ref 135–145)

## 2022-06-28 LAB — CBC
HCT: 24.4 % — ABNORMAL LOW (ref 36.0–46.0)
Hemoglobin: 8 g/dL — ABNORMAL LOW (ref 12.0–15.0)
MCH: 30.7 pg (ref 26.0–34.0)
MCHC: 32.8 g/dL (ref 30.0–36.0)
MCV: 93.5 fL (ref 80.0–100.0)
Platelets: 176 10*3/uL (ref 150–400)
RBC: 2.61 MIL/uL — ABNORMAL LOW (ref 3.87–5.11)
RDW: 12.8 % (ref 11.5–15.5)
WBC: 7.8 10*3/uL (ref 4.0–10.5)
nRBC: 0 % (ref 0.0–0.2)

## 2022-06-28 LAB — GLUCOSE, CAPILLARY
Glucose-Capillary: 111 mg/dL — ABNORMAL HIGH (ref 70–99)
Glucose-Capillary: 118 mg/dL — ABNORMAL HIGH (ref 70–99)
Glucose-Capillary: 149 mg/dL — ABNORMAL HIGH (ref 70–99)
Glucose-Capillary: 210 mg/dL — ABNORMAL HIGH (ref 70–99)
Glucose-Capillary: 408 mg/dL — ABNORMAL HIGH (ref 70–99)
Glucose-Capillary: 75 mg/dL (ref 70–99)
Glucose-Capillary: 89 mg/dL (ref 70–99)

## 2022-06-28 MED ORDER — ORAL CARE MOUTH RINSE: 15.0000 mL | OROMUCOSAL | Status: DC | PRN

## 2022-06-28 MED ORDER — ALBUTEROL SULFATE (2.5 MG/3ML) 0.083% IN NEBU
2.5000 mg | INHALATION_SOLUTION | Freq: Once | RESPIRATORY_TRACT | Status: AC
  Administered 2022-06-28: 2.5 mg via RESPIRATORY_TRACT
  Filled 2022-06-28: qty 3

## 2022-06-28 MED ORDER — FERROUS SULFATE 325 (65 FE) MG PO TABS
325.0000 mg | ORAL_TABLET | Freq: Every day | ORAL | Status: DC
  Administered 2022-06-28 – 2022-07-09 (×12): 325 mg via ORAL
  Filled 2022-06-28 (×12): qty 1

## 2022-06-28 MED ORDER — SODIUM ZIRCONIUM CYCLOSILICATE 5 G PO PACK
5.0000 g | PACK | Freq: Every day | ORAL | Status: AC
  Administered 2022-06-28: 5 g via ORAL
  Filled 2022-06-28: qty 1

## 2022-06-28 MED ORDER — INSULIN GLARGINE-YFGN 100 UNIT/ML ~~LOC~~ SOLN
5.0000 [IU] | Freq: Every day | SUBCUTANEOUS | Status: DC
  Filled 2022-06-28: qty 0.05

## 2022-06-28 MED ORDER — INSULIN ASPART 100 UNIT/ML IJ SOLN
0.0000 [IU] | Freq: Three times a day (TID) | INTRAMUSCULAR | Status: DC
  Administered 2022-06-28 – 2022-06-29 (×2): 9 [IU] via SUBCUTANEOUS
  Filled 2022-06-28 (×2): qty 1

## 2022-06-28 NOTE — TOC Progression Note (Signed)
Transition of Care [facility]) - Progression Note    Patient Details  Name: Ashley Camacho MRN: [id number] Date of Birth: 11-26-1990  Transition of Care [facility]) CM/SW Contact  Gerilyn Pilgrim, LCSW Phone Number: 06/28/2022, 1:58 PM  Clinical Narrative:   [facility]. Multiple attempts to get in touch with them.    Expected Discharge Plan:  (TBD) Barriers to Discharge: Continued Medical Work up  Expected Discharge Plan and Services In-house Referral: Development worker, community, Nutrition Discharge Planning Services: CM Consult   Living arrangements for the past 2 months: Single Family Home                                       Social Determinants of Health (SDOH) Interventions SDOH Screenings   Food Insecurity: No Food Insecurity (06/10/2022)  Housing: Low Risk  (06/10/2022)  Transportation Needs: No Transportation Needs (06/10/2022)  Utilities: Not At Risk (06/10/2022)  Depression (PHQ2-9): Low Risk  (11/11/2020)  Tobacco Use: Low Risk  (06/17/2022)    Readmission Risk Interventions    11/03/2021   10:37 AM 06/16/2021   12:30 PM 06/03/2021    4:26 PM  Readmission Risk Prevention Plan  Transportation Screening Complete Complete Complete  Medication Review Press photographer) Complete Complete Complete  PCP or Specialist appointment within 3-5 days of discharge Complete Complete Complete  HRI or [place] Not Complete  Not Complete  HRI or Home Care Consult Pt Refusal Comments NA  NA  SW Recovery Care/Counseling Consult Not Complete Complete Complete  SW Consult Not Complete Comments NA    Palliative Care Screening Not Applicable Not Applicable Not [place] Not Applicable Not Applicable Not Applicable

## 2022-06-28 NOTE — Progress Notes (Signed)
. Progress Note   Patient: Ashley Camacho QQV:[id number] DOB: 01/25/1991 DOA: 06/09/2022     19 DOS: the patient was seen and examined on 06/28/2022   Brief hospital course: 32 year old female brought to the emergency room for weakness, vomiting and lethargy.  She was found to be in diabetic ketoacidosis.  She also has a history of depression and stopped taking her medication secondary to cost.  Patient has a history of adrenal insufficiency and diabetes.   1/18.  Patient interested in having a place to stay to help take care of her.  Case discussed with psychiatry to see the patient in consultation.  Tapered off insulin drip to long-acting insulin. 1/19.  Patient agreeable to Dobbhoff feeding tube.  IR placed 1/20.  Dobbhoff tube has not advanced through the stomach on 3 x-rays.  General surgery consulted for jejunostomy tube placement.  Dobbhoff tube removed. 1/21.  Jejunostomy tube placed by Dr. Dahlia Byes. 1/22.  Fluid bolus for low BP;s & stress dose steroids. Transfused 1 units for Hbg 7.4.   1/23.  Patient feeling a little bit better.  Steroids tapered back to home Corteg.  Had some intermittent vomiting yesterday.  Up to 30 mL an hour with tube feeds by J-tube.   1/24: pt reports feeling miserable with recurrent N/V and abdominal discomfort.  Tube feeds just increased 40 >> 50 cc/hr.  Reduced TF rate to see if she feels better. 1/25: stopped Reglan, increased erythromycin, reduced TF rate to 30 cc/hr.  Given IV Lasix for dependent edema. 1/26: pt eating a sandwich, N/V improved today.  .  Given IV Lasix again. 1/28: no N/V past couple days, tolerating meals much better, requesting Glucerna shakes. Agreeable to resume mirtazapine for sleep, anxiety and appetite stimulation. 1/29: transition to nocturnal feeds tonight. She continues to do well.  Remeron helped w sleep. 1/30: hypoglycemic this AM (44) after being given insulin with glucose only 94 early AM.  Repeat 84, with tube feeds still  running. Transition to nocturnal feeds did not occur last night. 1/31: Nocturnal feeds started, she tolerated at 55 cc/hr (goal 75 cc/hr).  CBG very low at 19 this AM, recovered.  Insulin orders for tube feed coverage were adjusted. 2/2-2/5: adjusting feeds to account for blood sugar and electrolyte modification. Monitoring electrolytes. Patient near stable for dc. TOC engaged for placement.   Assessment and Plan:  Hypotension 2/2 adrenal insufficiency- resolved BP is now at goal.  She is on Cortef.  DKA- Type 1 diabetes mellitus with hypoglycemia without coma ([place]) Sugars have been labile since admission, more steady since tolerating PO intake better.  Requiring frequent regimen adjustments as tube feeds are transitioned to nocturnal --Continue 5 units Levemir daily NovoLog 4 units 3 times a day along with sliding scale. --Adjust for goal 140-180 --Hypoglycemia protocol  Gastroparesis --continue erythromycin to 100 mg TID, can go up to 250 mg if needed.  AKI on CKDII -resolved- Creatinine 2.58 on presentation and down to 1.1 with GFR greater than 60. Currently at baseline. - continue to monitor  Acute metabolic encephalopathy-resolved as of 06/17/2022 Resolved. Secondary to diabetic ketoacidosis.    Protein-calorie malnutrition, severe Jejunostomy tube placed on 1/21 by Dr. Dahlia Byes.  Started on tube feeds. --Appreciate Dietitian's recs 1/24>>29: started and titrated up tube feeds.  Started tolerating meals again, no N/V.  1/30: transitioning to nocturnal feeds, adjusting insulin accordingly --Added Remeron for appetite stimulant, mood disorder/s and sleep. - consulted general surgery for staple removal 2/5  Resolved, post repletion  hypophosphatemia Refeeding Syndrome Replaced on 1/22, 1/26, 1/27 1/28 given IV phos-Nak 1/29 phos 4.1 1/30 phos 3.3 Phosphorus level stable as of 06/25/2022.  Electrolyte abnormalities- due to poor PO intake, refeeding syndrome, feeding supplement  altercations - monitor K+, Mg++, Na+, phos - albuterol and lokelma PRN for hyperkalemia - replete PRN  Adrenal insufficiency ([place]) Initially required stress dose steroids.  Transitioned to PO Cortef on 1/24. - continue hydrocortisone   Thrombocytopenia- resolved  Chronic diastolic CHF Last echo 18/06/9935 -- EF 50-55% and normal diastolic parameters, apparently prior EF as low as 30%. --Monitor volume status with strict I/O's and daily weights --Given IV Lasix 20 mg 1/22, 1/25, 1/26, 1/28  --Further diuresis as needed, assess daily  Resolved pseudohyponatremia Sodium initially low secondary to diabetic ketoacidosis.  Last sodium in the normal range.  Major depressive disorder, recurrent episode, moderate (HCC) Seen by psychiatry. They started Prozac. Remeron at bedtime to help with mood, sleep and appetite stimulation.  Hypothyroidism TSH slightly high.  Restarted levothyroxine at a slightly higher dose.  Insomnia She used to be on Remeron for depression/anxiety and appetite stimulant. Appears not on this PTA, unclear when she last was taking it, she does not recall. --Start Remeron 15 mg at bedtime --As needed melatonin  Underweight BMI 17.83 History of eating disorder as well. Now with J tube, attempting to optimize nutrition status, but limited by recurrent abdominal pain and N/V.  Anemia of chronic disease- Status post transfusion of 2 units pRBC's  (on admission, and on 1/22) earlier this admission.  Ferritin elevated at 1511.   Hemoglobin 8.8.  No overt bleeding reported.  Physical Exam: Vitals:   06/27/22 2020 06/28/22 0439 06/28/22 0500 06/28/22 0717  BP: (!) 145/79 (!) 150/82  138/79  Pulse: 93 92  88  Resp: 17 17  16   Temp: 97.8 F (36.6 C) 98.2 F (36.8 C)  97.7 F (36.5 C)  TempSrc:      SpO2: 100% 100%  100%  Weight:   47.4 kg   Height:       General exam: Frail appearing in no acute distress.  She is alert oriented x 3.   HEENT: moist mucus  membranes, hearing grossly normal  Respiratory system: equal air entry b/l  Cardiovascular system: Regular rate and rhythm no rubs or gallops. Gastrointestinal system: Soft noted normal bowel sounds, J tube with continuous tube feeds at 55 cc/hr Central nervous system: A&O x 3. no gross focal neurologic deficits, normal speech Extremities: moves all , no edema, normal tone Skin: dry, intact, normal temperature Psychiatry: Mood is appropriate for condition and setting.    Data Reviewed:    Latest Ref Rng & Units 06/28/2022    4:48 AM 06/27/2022   11:45 AM 06/27/2022    4:18 AM  BMP  Glucose 70 - 99 mg/dL 86  71  193   BUN 6 - 20 mg/dL 69  80  74   Creatinine 0.44 - 1.00 mg/dL 1.24  1.29  1.23   Sodium 135 - 145 mmol/L 141  140  137   Potassium 3.5 - 5.1 mmol/L 5.5  4.8  6.2   Chloride 98 - 111 mmol/L 104  105  104   CO2 22 - 32 mmol/L 29  27  27    Calcium 8.9 - 10.3 mg/dL 8.5  8.6  8.3     Disposition: Status is: Inpatient Remains inpatient appropriate because: remains on tube feeds, transitioning to nocturnal feeds in prep for discharge.  Hypoglycemic  episodes needing frequent insulin titration. TOC engaged for placement    Planned Discharge Destination:  Group home placement vs home  Time spent: 42 minutes  Author: Richarda Osmond, MD 06/28/2022 7:29 AM  For on call review [email or web address].

## 2022-06-28 NOTE — Progress Notes (Signed)
   06/28/22 1000  Spiritual Encounters  Type of Visit Attempt (pt unavailable)  H[place] will continue to follow   PT was asleep Chaplain will return.

## 2022-06-28 NOTE — Progress Notes (Signed)
Brief Note POD15 from jejunostomy tube placement Doing well On soft diet Cyclic/Nocturnal tube feeds   Incision is CDI with staples, no erythema Staples removed; steri-strips placed Jejunostomy site is CDI  Staples removed; steri-strips in place Wound care reviewed Follow up with general surgery as needed  Please call with questions/concerns  -- Edison Simon, PA-C [facility] 06/28/2022, 12:40 PM M-F: 7am - 4pm

## 2022-06-29 DIAGNOSIS — E43 Unspecified severe protein-calorie malnutrition: Secondary | ICD-10-CM | POA: Diagnosis not present

## 2022-06-29 DIAGNOSIS — R571 Hypovolemic shock: Secondary | ICD-10-CM | POA: Diagnosis not present

## 2022-06-29 DIAGNOSIS — K3184 Gastroparesis: Secondary | ICD-10-CM | POA: Diagnosis not present

## 2022-06-29 DIAGNOSIS — E1011 Type 1 diabetes mellitus with ketoacidosis with coma: Secondary | ICD-10-CM | POA: Diagnosis not present

## 2022-06-29 DIAGNOSIS — N1832 Chronic kidney disease, stage 3b: Secondary | ICD-10-CM | POA: Diagnosis not present

## 2022-06-29 DIAGNOSIS — Z934 Other artificial openings of gastrointestinal tract status: Secondary | ICD-10-CM | POA: Diagnosis not present

## 2022-06-29 LAB — BASIC METABOLIC PANEL
Anion gap: 9 (ref 5–15)
Anion gap: 9 (ref 5–15)
Anion gap: 7 (ref 5–15)
BUN: 81 mg/dL — ABNORMAL HIGH (ref 6–20)
BUN: 75 mg/dL — ABNORMAL HIGH (ref 6–20)
BUN: 73 mg/dL — ABNORMAL HIGH (ref 6–20)
CO2: 26 mmol/L (ref 22–32)
CO2: 25 mmol/L (ref 22–32)
CO2: 27 mmol/L (ref 22–32)
Calcium: 8 mg/dL — ABNORMAL LOW (ref 8.9–10.3)
Calcium: 8.7 mg/dL — ABNORMAL LOW (ref 8.9–10.3)
Calcium: 8.4 mg/dL — ABNORMAL LOW (ref 8.9–10.3)
Chloride: 101 mmol/L (ref 98–111)
Chloride: 105 mmol/L (ref 98–111)
Chloride: 104 mmol/L (ref 98–111)
Creatinine, Ser: 1.32 mg/dL — ABNORMAL HIGH (ref 0.44–1.00)
Creatinine, Ser: 1.14 mg/dL — ABNORMAL HIGH (ref 0.44–1.00)
Creatinine, Ser: 1.19 mg/dL — ABNORMAL HIGH (ref 0.44–1.00)
GFR, Estimated: 55 mL/min — ABNORMAL LOW (ref >60)
GFR, Estimated: >60 mL/min (ref >60)
GFR, Estimated: >60 mL/min (ref >60)
Glucose, Bld: 594 mg/dL (ref 70–99)
Glucose, Bld: 63 mg/dL — ABNORMAL LOW (ref 70–99)
Glucose, Bld: 59 mg/dL — ABNORMAL LOW (ref 70–99)
Potassium: 6.1 mmol/L — ABNORMAL HIGH (ref 3.5–5.1)
Potassium: 5 mmol/L (ref 3.5–5.1)
Potassium: 5.5 mmol/L — ABNORMAL HIGH (ref 3.5–5.1)
Sodium: 136 mmol/L (ref 135–145)
Sodium: 139 mmol/L (ref 135–145)
Sodium: 138 mmol/L (ref 135–145)

## 2022-06-29 LAB — GLUCOSE, CAPILLARY
Glucose-Capillary: 280 mg/dL — ABNORMAL HIGH (ref 70–99)
Glucose-Capillary: 299 mg/dL — ABNORMAL HIGH (ref 70–99)
Glucose-Capillary: 31 mg/dL — CL (ref 70–99)
Glucose-Capillary: 342 mg/dL — ABNORMAL HIGH (ref 70–99)
Glucose-Capillary: 409 mg/dL — ABNORMAL HIGH (ref 70–99)
Glucose-Capillary: 53 mg/dL — ABNORMAL LOW (ref 70–99)
Glucose-Capillary: 569 mg/dL (ref 70–99)
Glucose-Capillary: 68 mg/dL — ABNORMAL LOW (ref 70–99)
Glucose-Capillary: 99 mg/dL (ref 70–99)
Glucose-Capillary: >600 mg/dL (ref 70–99)
Glucose-Capillary: >600 mg/dL (ref 70–99)

## 2022-06-29 LAB — CBC
HCT: 25.5 % — ABNORMAL LOW (ref 36.0–46.0)
Hemoglobin: 8.3 g/dL — ABNORMAL LOW (ref 12.0–15.0)
MCH: 30.7 pg (ref 26.0–34.0)
MCHC: 32.5 g/dL (ref 30.0–36.0)
MCV: 94.4 fL (ref 80.0–100.0)
Platelets: 176 10*3/uL (ref 150–400)
RBC: 2.7 MIL/uL — ABNORMAL LOW (ref 3.87–5.11)
RDW: 13 % (ref 11.5–15.5)
WBC: 6.9 10*3/uL (ref 4.0–10.5)
nRBC: 0 % (ref 0.0–0.2)

## 2022-06-29 LAB — PHOSPHORUS: Phosphorus: 3.7 mg/dL (ref 2.5–4.6)

## 2022-06-29 LAB — MAGNESIUM: Magnesium: 2.1 mg/dL (ref 1.7–2.4)

## 2022-06-29 MED ORDER — INSULIN ASPART 100 UNIT/ML IJ SOLN
0.0000 [IU] | Freq: Three times a day (TID) | INTRAMUSCULAR | Status: DC
  Administered 2022-06-29: 8 [IU] via SUBCUTANEOUS
  Filled 2022-06-29: qty 1

## 2022-06-29 MED ORDER — FREE WATER
30.0000 mL | Status: DC
  Administered 2022-06-29 – 2022-06-30 (×3): 30 mL

## 2022-06-29 MED ORDER — GLUCOSE 40 % PO GEL
1.0000 | Freq: Once | ORAL | Status: DC | PRN
  Filled 2022-06-29: qty 1

## 2022-06-29 MED ORDER — OSMOLITE 1.5 CAL PO LIQD
780.0000 mL | ORAL | Status: DC
  Administered 2022-06-29 – 2022-07-04 (×3): 780 mL

## 2022-06-29 MED ORDER — INSULIN GLARGINE-YFGN 100 UNIT/ML ~~LOC~~ SOLN
20.0000 [IU] | Freq: Every day | SUBCUTANEOUS | Status: DC
  Administered 2022-06-29: 20 [IU] via SUBCUTANEOUS
  Filled 2022-06-29: qty 0.2

## 2022-06-29 MED ORDER — INSULIN ASPART 100 UNIT/ML IJ SOLN
9.0000 [IU] | Freq: Once | INTRAMUSCULAR | Status: AC
  Administered 2022-06-29: 9 [IU] via SUBCUTANEOUS
  Filled 2022-06-29: qty 1

## 2022-06-29 MED ORDER — INSULIN ASPART 100 UNIT/ML IJ SOLN
0.0000 [IU] | Freq: Three times a day (TID) | INTRAMUSCULAR | Status: DC
  Administered 2022-06-30: 2 [IU] via SUBCUTANEOUS
  Filled 2022-06-29: qty 1

## 2022-06-29 MED ORDER — INSULIN GLARGINE-YFGN 100 UNIT/ML ~~LOC~~ SOLN
10.0000 [IU] | Freq: Every day | SUBCUTANEOUS | Status: DC
  Administered 2022-06-30: 10 [IU] via SUBCUTANEOUS
  Filled 2022-06-29: qty 0.1

## 2022-06-29 NOTE — Inpatient Diabetes Management (Signed)
Inpatient Diabetes Program Recommendations  AACE/ADA: New Consensus Statement on Inpatient Glycemic Control (2015)  Target Ranges:  Prepandial:   less than 140 mg/dL      Peak postprandial:   less than 180 mg/dL (1-2 hours)      Critically ill patients:  140 - 180 mg/dL   Lab Results  Component Value Date   GLUCAP 569 (HH) 06/29/2022   HGBA1C 10.2 (H) 02/28/2022    Latest Reference Range & Units 06/28/22 07:18 06/28/22 11:51 06/28/22 16:04 06/28/22 21:23 06/29/22 05:35 06/29/22 06:58 06/29/22 07:37  Glucose-Capillary 70 - 99 mg/dL 149 (H) 75 408 (H) 118 (H) >600 (HH) >600 (HH) 569 (HH)  (HH): Data is critically high (H): Data is abnormally high  Latest Reference Range & Units 06/29/22 04:41  Sodium 135 - 145 mmol/L 136  Potassium 3.5 - 5.1 mmol/L 6.1 (H)  Chloride 98 - 111 mmol/L 101  CO2 22 - 32 mmol/L 26  Glucose 70 - 99 mg/dL 594 (HH)  BUN 6 - 20 mg/dL 81 (H)  Creatinine 0.44 - 1.00 mg/dL 1.32 (H)  Calcium 8.9 - 10.3 mg/dL 8.0 (L)  Anion gap 5 - 15  9  Phosphorus 2.5 - 4.6 mg/dL 3.7  Magnesium 1.7 - 2.4 mg/dL 2.1  (HH): Data is critically high (H): Data is abnormally high (L): Data is abnormally low  Inpatient Diabetes Program Recommendations:   CBGs elevated this am. Noted patient did not receive basal insulin yesterday and CBGs elevated this am. Also received nutritional supplement @ 2138 pm.  Decrease Semglee to 5 units. Decrease Novolog correction to 0-6 units q 4 hrs. Discontinue all other insulin orders until in am and reassess  Anticipate recurrent hypoglycemia over the next 24 hrs. Patient received Semglee 20 units @ 8:42 this am. Discussed with Dr. Ouida Sills and RN Alric Quan.  Thank you, Nani Gasser. Daveah Varone, RN, MSN, CDE  Diabetes Coordinator Inpatient Glycemic Control Team Team Pager [phone] (8am-5pm) 06/29/2022 8:49 AM

## 2022-06-29 NOTE — Progress Notes (Signed)
. Progress Note   Patient: Ashley Camacho LNL:[id number] DOB: 1990/09/20 DOA: 06/09/2022     20 DOS: the patient was seen and examined on 06/29/2022   Brief hospital course: 32 year old female brought to the emergency room for weakness, vomiting and lethargy.  She was found to be in diabetic ketoacidosis.  She also has a history of depression and stopped taking her medication secondary to cost.  Patient has a history of adrenal insufficiency and diabetes.   1/18.  Patient interested in having a place to stay to help take care of her.  Case discussed with psychiatry to see the patient in consultation.  Tapered off insulin drip to long-acting insulin. 1/19.  Patient agreeable to Dobbhoff feeding tube.  IR placed 1/20.  Dobbhoff tube has not advanced through the stomach on 3 x-rays.  General surgery consulted for jejunostomy tube placement.  Dobbhoff tube removed. 1/21.  Jejunostomy tube placed by Dr. Dahlia Byes. 1/22.  Fluid bolus for low BP;s & stress dose steroids. Transfused 1 units for Hbg 7.4.   1/23.  Patient feeling a little bit better.  Steroids tapered back to home Corteg.  Had some intermittent vomiting yesterday.  Up to 30 mL an hour with tube feeds by J-tube.   1/24: pt reports feeling miserable with recurrent N/V and abdominal discomfort.  Tube feeds just increased 40 >> 50 cc/hr.  Reduced TF rate to see if she feels better. 1/25: stopped Reglan, increased erythromycin, reduced TF rate to 30 cc/hr.  Given IV Lasix for dependent edema. 1/26: pt eating a sandwich, N/V improved today.  .  Given IV Lasix again. 1/28: no N/V past couple days, tolerating meals much better, requesting Glucerna shakes. Agreeable to resume mirtazapine for sleep, anxiety and appetite stimulation. 1/29: transition to nocturnal feeds tonight. She continues to do well.  Remeron helped w sleep. 1/30: hypoglycemic this AM (44) after being given insulin with glucose only 94 early AM.  Repeat 84, with tube feeds still  running. Transition to nocturnal feeds did not occur last night. 1/31: Nocturnal feeds started, she tolerated at 55 cc/hr (goal 75 cc/hr).  CBG very low at 19 this AM, recovered.  Insulin orders for tube feed coverage were adjusted. 2/2-2/5: adjusting feeds to account for blood sugar and electrolyte modification. Monitoring electrolytes. Patient near stable for dc. TOC engaged for placement to group home but patient states she can go [facility] with [facility] if not able to find a placement. Staples removed 2/5. 2/6: continue to adjust feeding and insulin regimen to better control blood sugars and electrolytes.   Assessment and Plan:  Hypotension 2/2 adrenal insufficiency- resolved BP is now at goal.  She is on Cortef.  DKA-resolved Type 1 diabetes mellitus-  Sugars have been labile since admission with fluctuating nutrition intake. With chronic steroid use and introduction of enteral feeds via J tube, blood sugar control has been an ongoing challenge. Increasing insulin today for hyperglycemia >600 this am will also help with hyperkalemia. -- increased semglee to 20 units this morning with morning glucose reading >600 - increased to moderate sliding scale - continue NovoLog 4 units meal coverage - added glucose and dextrose PRN for hypoglycemia - monitor CBGs q4 hours  Gastroparesis- j-tube in place and healing well.  --continue erythromycin to 100 mg TID, can go up to 250 mg if needed.  AKI on CKDII -resolved- Creatinine 2.58 on presentation and down to 1.1 with GFR greater than 60. Currently at baseline. - continue to monitor  Acute metabolic  encephalopathy-resolved as of 06/17/2022 Resolved. Secondary to diabetic ketoacidosis.    Protein-calorie malnutrition, severe Jejunostomy tube placed on 1/21 by Dr. Dahlia Byes.  Started on tube feeds. Staples removed 2/5 --Appreciate Dietitian's recs 1/24>>29: started and titrated up tube feeds.  Started tolerating meals again, no N/V.  1/30: transitioning to  nocturnal feeds, adjusting insulin accordingly - continue Remeron for appetite stimulant, mood disorder/s and sleep.  Hypophosphatemia  Refeeding Syndrome- Resolved, post repletion and remains stable. 3.7 on 2/6  Electrolyte abnormalities- due to poor PO intake, refeeding syndrome, feeding supplement  - monitor K+, Mg++, Na+, phos - albuterol and lokelma PRN for hyperkalemia - replete PRN  Adrenal insufficiency (Crisp) Initially required stress dose steroids. Transitioned to PO Cortef on 1/24. - continue hydrocortisone 10am 20pm  Thrombocytopenia- resolved  Chronic diastolic CHF Last echo 63/0/1601 -- EF 50-55% and normal diastolic parameters, apparently prior EF as low as 30%. --Monitor volume status with strict I/O's and daily weights --Given IV Lasix 20 mg 1/22, 1/25, 1/26, 1/28  --Further diuresis as needed, assess daily  Major depressive disorder, recurrent episode, moderate (HCC) Insomnia Seen by psychiatry. They started Prozac. Remeron at bedtime to help with mood, sleep and appetite stimulation. - melatonin PRN  Hypothyroidism TSH slightly high.  Restarted levothyroxine at a slightly higher dose.  Underweight BMI 17.83 History of eating disorder as well. Now with J tube, attempting to optimize nutrition status, but limited by recurrent abdominal pain and N/V.  Anemia of chronic disease- Status post transfusion of 2 units pRBC's  (on admission, and on 1/22) earlier this admission.  Ferritin elevated at 1511.   Hemoglobin 8.8>>>8.3.  No overt bleeding reported.  Physical Exam: Vitals:   06/28/22 1304 06/28/22 1607 06/28/22 2100 06/29/22 0500  BP:  (!) 154/90 (!) 151/86 135/82  Pulse:  99 92 95  Resp:  19 18 18   Temp:  97.9 F (36.6 C)  (!) 97.4 F (36.3 C)  TempSrc:    Oral  SpO2: 100% 100% 100% 100%  Weight:      Height:       General exam: Frail appearing in no acute distress.  She is alert oriented x 3.   HEENT: moist mucus membranes, hearing grossly  normal  Respiratory system: equal air entry b/l  Cardiovascular system: Regular rate and rhythm no rubs or gallops. Gastrointestinal system: Soft noted normal bowel sounds, J tube in place and insertion site without drainage or signs of infection. Surgical incision healing well. Staples removed Central nervous system: A&O x 3. no gross focal neurologic deficits, normal speech Extremities: moves all , no edema, normal tone Skin: dry, intact, normal temperature Psychiatry: Mood is appropriate for condition and setting.    Data Reviewed:    Latest Ref Rng & Units 06/29/2022    4:41 AM 06/28/2022    4:48 AM 06/27/2022   11:45 AM  BMP  Glucose 70 - 99 mg/dL 594  86  71   BUN 6 - 20 mg/dL 81  69  80   Creatinine 0.44 - 1.00 mg/dL 1.32  1.24  1.29   Sodium 135 - 145 mmol/L 136  141  140   Potassium 3.5 - 5.1 mmol/L 6.1  5.5  4.8   Chloride 98 - 111 mmol/L 101  104  105   CO2 22 - 32 mmol/L 26  29  27    Calcium 8.9 - 10.3 mg/dL 8.0  8.5  8.6     Disposition: Status is: Inpatient Remains inpatient appropriate because: continues  to have poorly controlled electrolytes and blood sugars    Planned Discharge Destination:  Group home placement vs home  Time spent: 42 minutes  Author: Richarda Osmond, MD 06/29/2022 7:18 AM  For on call review [email or web address].

## 2022-06-29 NOTE — Progress Notes (Signed)
Patients blood sugar rechecked at 1500 and it was 53. Made MD aware, MD placed order for BMP to recheck and when it resulted at 15:55 patients blood sugar was 59. MD said to give glucose, 25 of D50 given IV and to hold dinner insulin. Blood sugar rechecked at 17:09 and it was 99. MD placed new orders for tube feeds as well.

## 2022-06-29 NOTE — Progress Notes (Addendum)
This afternoon patients blood sugar was 31. Gave oral glucose, crackers, peanut butter and juice. Rechecked blood sugar and it was 68. Will recheck again at 1500.

## 2022-06-29 NOTE — TOC Progression Note (Signed)
Transition of Care [facility]) - Progression Note    Patient Details  Name: Ashley Camacho MRN: [id number] Date of Birth: 05/24/1991  [facility]) CM/SW Contact  Gerilyn Pilgrim, LCSW Phone Number: 06/29/2022, 4:11 PM  Clinical Narrative:    CSW spoke with patient. Pt reports she would like to go home with [facility] services if possible. SW explained pt does not have a PCP and cannot get [facility] services without a PCP. Pt reports she does not have transportation to get to appts but states she will once she gets medicaid. Medicaid is still pending. Joy from [place] updated.    Expected Discharge Plan:  (TBD) Barriers to Discharge: Continued Medical Work up  Expected Discharge Plan and Services In-house Referral: Development worker, community, Nutrition Discharge Planning Services: CM Consult   Living arrangements for the past 2 months: Single Family Home                                       Social Determinants of Health (SDOH) Interventions SDOH Screenings   Food Insecurity: No Food Insecurity (06/10/2022)  Housing: Low Risk  (06/10/2022)  Transportation Needs: No Transportation Needs (06/10/2022)  Utilities: Not At Risk (06/10/2022)  Depression (PHQ2-9): Low Risk  (11/11/2020)  Tobacco Use: Low Risk  (06/17/2022)    Readmission Risk Interventions    11/03/2021   10:37 AM 06/16/2021   12:30 PM 06/03/2021    4:26 PM  Readmission Risk Prevention Plan  Transportation Screening Complete Complete Complete  Medication Review Press photographer) Complete Complete Complete  PCP or Specialist appointment within 3-5 days of discharge Complete Complete Complete  HRI or [place] Not Complete  Not Complete  HRI or Home Care Consult Pt Refusal Comments NA  NA  SW Recovery Care/Counseling Consult Not Complete Complete Complete  SW Consult Not Complete Comments NA    Palliative Care Screening Not Applicable Not Applicable Not [place] Not Applicable Not Applicable  Not Applicable

## 2022-06-29 NOTE — Progress Notes (Signed)
Nutrition Follow-up  DOCUMENTATION CODES:   Underweight, Severe malnutrition in context of chronic illness  INTERVENTION:   -Continue Glucerna Shake po TID, each supplement provides 220 kcal and 10 grams of protein  -TF via j-tube:   Osmolite 1.5 @ 65 ml/hr over 12 hours   30 ml free water flush every 4 hours   Tube feeding regimen provides 11170 kcal (71% of needs), 49 grams of protein, and 593 ml of H2O.  Total free water: 773 ml daily  NUTRITION DIAGNOSIS:   Severe Malnutrition related to social / environmental circumstances as evidenced by moderate muscle depletion, severe muscle depletion, moderate fat depletion, severe fat depletion, percent weight loss.  Ongoing  GOAL:   Patient will meet greater than or equal to 90% of their needs  Progressing   MONITOR:   PO intake, Supplement acceptance, Diet advancement  REASON FOR ASSESSMENT:   Consult Assessment of nutrition requirement/status, Calorie Count  ASSESSMENT:   Pt with PMH of type 1 DM, HTN, gastroparesis, anemia, and adrenal insufficiency admitted for evaluation of weakness and vomiting.  1/19- IR placed NGT 1/21- s/p open j-tube placement  Reviewed I/O's: +4 L x 24 hours and +22 L since 06/15/22   Spoke with pt at bedside, who was pleasant and in goods spirits today. Pt reports improved appetite- noted she consumed 100% of breakfast tray today. She denies any more nausea and vomiting with eating and with TF. Pt reports she likes Glucerna supplements, but mostly only drinks them in the evening, when she is most hungry. Noted meal completions 50-100%.   Case discussed with RN, MD, and diabetes coordinator. Pt is extremely sensitive to insulin and has has multiple peroids of hypoglycemia today. Noted TF were held overnight secondary to hyperglycemia. Per RN, CBGS between 50-60's this shift.   Discussed plan to decrease TF due to increased oral intake and additional freedom off pump. Pt amenable to plan.    Per TOC notes, pt desires to go home with home health services. Pt still awaiting medicaid.   Medications reviewed and include ferrous sulfate and remeron.   Labs reviewed: CBGS: 53-68 (inpatient orders for glycemic control are 0-9 units insulin aspart TID with meals and 4 units insulin aspart TID with meals, and 10 units insulin glargine-yfgn daily).    Diet Order:   Diet Order             DIET SOFT Room service appropriate? Yes; Fluid consistency: Thin  Diet effective now                   EDUCATION NEEDS:   Education needs have been addressed  Skin:  Skin Assessment: Skin Integrity Issues: Skin Integrity Issues:: Incisions Incisions: closed abdomen s/p j-tube placement  Last BM:  06/28/22  Height:   Ht Readings from Last 1 Encounters:  06/10/22 5\' 1"  (1.549 m)    Weight:   Wt Readings from Last 1 Encounters:  06/28/22 47.4 kg    Ideal Body Weight:  47.7 kg  BMI:  Body mass index is 19.74 kg/m.  Estimated Nutritional Needs:   Kcal:  1650-1850  Protein:  80-95 grams  Fluid:  > 1.6 L    Loistine Chance, RD, LDN, [place] Registered Dietitian II Certified Diabetes Care and Education Specialist Please refer to [facility] for RD and/or RD on-call/weekend/after hours pager

## 2022-06-29 NOTE — Progress Notes (Signed)
       CROSS COVER NOTE  NAME: Ashley Camacho MRN: [id number] DOB : 1991/02/04 ATTENDING PHYSICIAN: Richarda Osmond, MD    Date of Service   06/29/2022   HPI/Events of Note   Notified of elevated blood glucose--> 594.  Anion gap is 9  Interventions   Assessment/Plan: Stop tube feeds Give 9 units NovoLog SSI Hold 4 units scheduled novolog      To reach the provider On-Call:   7AM- 7PM see care teams to locate the attending and reach out to them via [email or web address] 7PM-7AM contact night-coverage If you still have difficulty reaching the appropriate provider, please page the [facility] (Director on Call) for [facility] on amion for assistance  This document was prepared using Systems analyst and may include unintentional dictation errors.  [name] DNP, MBA, FNP-BC, PMHNP-BC Nurse Practitioner [facility] Pager [phone]

## 2022-06-29 NOTE — Progress Notes (Signed)
       CROSS COVER NOTE  NAME: Ashley Camacho MRN: [id number] DOB : Jul 05, 1990 ATTENDING PHYSICIAN: Ashley Osmond, MD    Date of Service   06/29/2022   HPI/Events of Note   Message received from nursing staff reporting CBG of 299. Ashley Camacho has VERY labile blood glucose levels. Requested staff notify me of CBG >400. This morning CBG dropped ~300mg /dL in 4 hrs and she later became severely hypoglycemic with CBG of 31 at 1355 today.  2205: CBG 409  Interventions   Assessment/Plan:  Novolog 9U      To reach the provider On-Call:   7AM- 7PM see care teams to locate the attending and reach out to them via [email or web address]: [email or web address] 7PM-7AM contact night-coverage If you still have difficulty reaching the appropriate provider, please page the [facility] (Director on Call) for [facility] on amion for assistance  This document was prepared using Systems analyst and may include unintentional dictation errors.  Ashley Glass DNP, MBA, FNP-BC, PMHNP-BC Nurse Practitioner [facility] Pager [phone]

## 2022-06-30 DIAGNOSIS — R571 Hypovolemic shock: Secondary | ICD-10-CM | POA: Diagnosis not present

## 2022-06-30 DIAGNOSIS — N1832 Chronic kidney disease, stage 3b: Secondary | ICD-10-CM | POA: Diagnosis not present

## 2022-06-30 DIAGNOSIS — E43 Unspecified severe protein-calorie malnutrition: Secondary | ICD-10-CM | POA: Diagnosis not present

## 2022-06-30 DIAGNOSIS — E1011 Type 1 diabetes mellitus with ketoacidosis with coma: Secondary | ICD-10-CM | POA: Diagnosis not present

## 2022-06-30 LAB — COMPREHENSIVE METABOLIC PANEL
ALT: 40 U/L (ref 0–44)
AST: 37 U/L (ref 15–41)
Albumin: 2.8 g/dL — ABNORMAL LOW (ref 3.5–5.0)
Alkaline Phosphatase: 73 U/L (ref 38–126)
Anion gap: 7 (ref 5–15)
BUN: 75 mg/dL — ABNORMAL HIGH (ref 6–20)
CO2: 28 mmol/L (ref 22–32)
Calcium: 8.5 mg/dL — ABNORMAL LOW (ref 8.9–10.3)
Chloride: 105 mmol/L (ref 98–111)
Creatinine, Ser: 1.19 mg/dL — ABNORMAL HIGH (ref 0.44–1.00)
GFR, Estimated: >60 mL/min (ref >60)
Glucose, Bld: 151 mg/dL — ABNORMAL HIGH (ref 70–99)
Potassium: 5.1 mmol/L (ref 3.5–5.1)
Sodium: 140 mmol/L (ref 135–145)
Total Bilirubin: 0.5 mg/dL (ref 0.3–1.2)
Total Protein: 5.3 g/dL — ABNORMAL LOW (ref 6.5–8.1)

## 2022-06-30 LAB — GLUCOSE, CAPILLARY
Glucose-Capillary: 111 mg/dL — ABNORMAL HIGH (ref 70–99)
Glucose-Capillary: 159 mg/dL — ABNORMAL HIGH (ref 70–99)
Glucose-Capillary: 162 mg/dL — ABNORMAL HIGH (ref 70–99)
Glucose-Capillary: 163 mg/dL — ABNORMAL HIGH (ref 70–99)
Glucose-Capillary: 177 mg/dL — ABNORMAL HIGH (ref 70–99)
Glucose-Capillary: 220 mg/dL — ABNORMAL HIGH (ref 70–99)
Glucose-Capillary: 229 mg/dL — ABNORMAL HIGH (ref 70–99)
Glucose-Capillary: 280 mg/dL — ABNORMAL HIGH (ref 70–99)

## 2022-06-30 LAB — CBC
HCT: 23.2 % — ABNORMAL LOW (ref 36.0–46.0)
Hemoglobin: 7.8 g/dL — ABNORMAL LOW (ref 12.0–15.0)
MCH: 31.3 pg (ref 26.0–34.0)
MCHC: 33.6 g/dL (ref 30.0–36.0)
MCV: 93.2 fL (ref 80.0–100.0)
Platelets: 191 10*3/uL (ref 150–400)
RBC: 2.49 MIL/uL — ABNORMAL LOW (ref 3.87–5.11)
RDW: 13.2 % (ref 11.5–15.5)
WBC: 8.4 10*3/uL (ref 4.0–10.5)
nRBC: 0 % (ref 0.0–0.2)

## 2022-06-30 MED ORDER — FREE WATER
30.0000 mL | Freq: Three times a day (TID) | Status: DC
  Administered 2022-06-30 – 2022-07-07 (×21): 30 mL

## 2022-06-30 MED ORDER — INSULIN ASPART 100 UNIT/ML IJ SOLN
0.0000 [IU] | INTRAMUSCULAR | Status: DC
  Administered 2022-06-30: 3 [IU] via SUBCUTANEOUS
  Administered 2022-06-30: 1 [IU] via SUBCUTANEOUS
  Administered 2022-07-01: 2 [IU] via SUBCUTANEOUS
  Administered 2022-07-01: 1 [IU] via SUBCUTANEOUS
  Administered 2022-07-01: 6 [IU] via SUBCUTANEOUS
  Administered 2022-07-02: 2 [IU] via SUBCUTANEOUS
  Administered 2022-07-02 (×2): 1 [IU] via SUBCUTANEOUS
  Administered 2022-07-03 (×2): 3 [IU] via SUBCUTANEOUS
  Administered 2022-07-03 – 2022-07-04 (×4): 1 [IU] via SUBCUTANEOUS
  Administered 2022-07-04: 3 [IU] via SUBCUTANEOUS
  Administered 2022-07-04: 1 [IU] via SUBCUTANEOUS
  Administered 2022-07-04 – 2022-07-05 (×2): 2 [IU] via SUBCUTANEOUS
  Administered 2022-07-05 – 2022-07-06 (×3): 1 [IU] via SUBCUTANEOUS
  Administered 2022-07-06: 3 [IU] via SUBCUTANEOUS
  Administered 2022-07-06: 1 [IU] via SUBCUTANEOUS
  Administered 2022-07-06: 4 [IU] via SUBCUTANEOUS
  Administered 2022-07-07: 2 [IU] via SUBCUTANEOUS
  Administered 2022-07-07: 1 [IU] via SUBCUTANEOUS
  Administered 2022-07-07: 3 [IU] via SUBCUTANEOUS
  Filled 2022-06-30 (×27): qty 1

## 2022-06-30 MED ORDER — INSULIN DETEMIR 100 UNIT/ML ~~LOC~~ SOLN
5.0000 [IU] | Freq: Two times a day (BID) | SUBCUTANEOUS | Status: DC
  Administered 2022-07-01 – 2022-07-09 (×17): 5 [IU] via SUBCUTANEOUS
  Filled 2022-06-30 (×18): qty 0.05

## 2022-06-30 MED ORDER — INSULIN ASPART 100 UNIT/ML IJ SOLN
2.0000 [IU] | Freq: Three times a day (TID) | INTRAMUSCULAR | Status: DC
  Administered 2022-07-01 (×2): 2 [IU] via SUBCUTANEOUS
  Filled 2022-06-30 (×2): qty 1

## 2022-06-30 NOTE — Progress Notes (Signed)
Inpatient Diabetes Program Recommendations  AACE/ADA: New Consensus Statement on Inpatient Glycemic Control (2015)  Target Ranges:  Prepandial:   less than 140 mg/dL      Peak postprandial:   less than 180 mg/dL (1-2 hours)      Critically ill patients:  140 - 180 mg/dL   Lab Results  Component Value Date   GLUCAP 177 (H) 06/30/2022   HGBA1C 10.2 (H) 02/28/2022    Review of Glycemic Control  Latest Reference Range & Units 06/29/22 17:09 06/29/22 20:16 06/29/22 22:02 06/29/22 23:51 06/30/22 03:53 06/30/22 05:43 06/30/22 07:58  Glucose-Capillary 70 - 99 mg/dL 99 299 (H) 409 (H) 342 (H) 163 (H) 159 (H) 177 (H)   Diabetes history: DM 1 Outpatient Diabetes medications:  Novolog 2 unit tid with meals Lantus 4 units bid Current orders for Inpatient glycemic control:  Novolog 0-9 units tid with meals Novolog 4 units tid with meals Semglee 10 units daily Osmolite 65 ml/hr 2000-0800 (nocturnal feed) Cortef 20 mg in AM and 10 mg in PM  Inpatient Diabetes Program Recommendations:    Blood sugars are erratic. Patient is very sensitive to insulin doses and omission of insulin. Recommend reducing Novolog correction to 0-6 units q 4 hours and change Novolog meal coverage/tube feed coverage to 2 units q 4 hours. For basal insulin, it may be safer to try Levemir 4 units bid (instead of Semglee 10 units once a day) again so that doses can be adjusted as needed. Evidently basal was not given on 06/28/22 which contributed to her very high blood sugar yesterday morning as well as nocturnal feeds. Discussed with pharmacist.  Will follow.   Thanks,  Adah Perl, RN, BC-ADM Inpatient Diabetes Coordinator Pager [phone]  (8a-5p)

## 2022-06-30 NOTE — Progress Notes (Signed)
. Progress Note   Patient: Ashley Camacho DJM:[id number] DOB: [birth year] DOA: 06/09/2022     21 DOS: the patient was seen and examined on 06/30/2022   Brief hospital course: 32 year old female brought to the emergency room for weakness, vomiting and lethargy.  She was found to be in diabetic ketoacidosis.  She also has a history of depression and stopped taking her medication secondary to cost.  Patient has a history of adrenal insufficiency and diabetes.   1/18.  Patient interested in having a place to stay to help take care of her.  Case discussed with psychiatry to see the patient in consultation.  Tapered off insulin drip to long-acting insulin. 1/19.  Patient agreeable to Dobbhoff feeding tube.  IR placed 1/20.  Dobbhoff tube has not advanced through the stomach on 3 x-rays.  General surgery consulted for jejunostomy tube placement.  Dobbhoff tube removed. 1/21.  Jejunostomy tube placed by Dr. Dahlia Byes. 1/22.  Fluid bolus for low BP;s & stress dose steroids. Transfused 1 units for Hbg 7.4.   1/23.  Patient feeling a little bit better.  Steroids tapered back to home Corteg.  Had some intermittent vomiting yesterday.  Up to 30 mL an hour with tube feeds by J-tube.   1/24: pt reports feeling miserable with recurrent N/V and abdominal discomfort.  Tube feeds just increased 40 >> 50 cc/hr.  Reduced TF rate to see if she feels better. 1/25: stopped Reglan, increased erythromycin, reduced TF rate to 30 cc/hr.  Given IV Lasix for dependent edema. 1/26: pt eating a sandwich, N/V improved today.  .  Given IV Lasix again. 1/28: no N/V past couple days, tolerating meals much better, requesting Glucerna shakes. Agreeable to resume mirtazapine for sleep, anxiety and appetite stimulation. 1/29: transition to nocturnal feeds tonight. She continues to do well.  Remeron helped w sleep. 1/30: hypoglycemic this AM (44) after being given insulin with glucose only 94 early AM.  Repeat 84, with tube feeds still  running. Transition to nocturnal feeds did not occur last night. 1/31: Nocturnal feeds started, she tolerated at 55 cc/hr (goal 75 cc/hr).  CBG very low at 19 this AM, recovered.  Insulin orders for tube feed coverage were adjusted. 2/2-2/5: adjusting feeds to account for blood sugar and electrolyte modification. Monitoring electrolytes. Patient near stable for dc. TOC engaged for placement to group home but patient states she can go home with [facility] if not able to find a placement. Staples removed 2/5. 2/6-2/7: continue to adjust feeding and insulin regimen to better control blood sugars and electrolytes.   06/30/22 patient feels well. Has some tenderness at site of j-tube due to being pulled in her sleep last night. She would like to shower. She is agreeable to going home when she is stable.   Assessment and Plan:  Hypotension 2/2 adrenal insufficiency- resolved BP is now at goal.  She is on Cortef.  DKA-resolved Type 1 diabetes mellitus-  Sugars have been labile since admission with fluctuating nutrition intake. With chronic steroid use and introduction of enteral feeds via J tube, blood sugar control has been an ongoing challenge.  - Insulin regimen being adjusted in conjunction with RD, diabetes coordinator, and pharmacy discussions. Patient would benefit from dexcom device, will need follow up with endocrinology first. - added glucose and dextrose PRN for hypoglycemia - monitor CBGs q4 hours  Gastroparesis- j-tube in place and healing well.  --continue erythromycin to 100 mg TID, can go up to 250 mg if needed.  AKI  on CKDII -resolved- Creatinine 2.58 on presentation and down to 1.1 with GFR greater than 60. Currently at baseline. - continue to monitor  Acute metabolic encephalopathy-resolved as of 06/17/2022 Resolved. Secondary to diabetic ketoacidosis.    Protein-calorie malnutrition, severe Jejunostomy tube placed on 1/21 by Dr. Dahlia Byes.  Started on tube feeds. Staples removed  2/5 --Appreciate Dietitian's recs 1/24>>29: started and titrated up tube feeds.  Started tolerating meals again, no N/V.  1/30: transitioning to nocturnal feeds, adjusting insulin accordingly 2/1- 2/7: continues on nocturnal feeds and tolerating well, rate gradually decreases as PO intake has improved.  - continue Remeron for appetite stimulant, mood disorder/s and sleep.  Hypophosphatemia  Refeeding Syndrome- Resolved, post repletion and remains stable. 3.7 on 2/6  Electrolyte abnormalities- due to poor PO intake, refeeding syndrome, feeding supplement  - monitor K+, Mg++, Na+, phos - albuterol and lokelma PRN for hyperkalemia - replete PRN  Adrenal insufficiency ([place]) Initially required stress dose steroids. Transitioned to PO Cortef on 1/24. - continue hydrocortisone 10mg  am 20mg  pm  Thrombocytopenia- resolved  Chronic diastolic CHF Last echo 40/01/7352 -- EF 50-55% and normal diastolic parameters, apparently prior EF as low as 30%. --Monitor volume status with strict I/O's and daily weights --Given IV Lasix 20 mg 1/22, 1/25, 1/26, 1/28  --Further diuresis as needed, assess daily  Major depressive disorder, recurrent episode, moderate (HCC) Insomnia Seen by psychiatry. They started Prozac. Remeron at bedtime to help with mood, sleep and appetite stimulation. - melatonin PRN  Hypothyroidism TSH slightly high.  Restarted levothyroxine at a slightly higher dose.  Underweight BMI 17.83 History of eating disorder as well. Now with J tube, attempting to optimize nutrition status, but limited by recurrent abdominal pain and N/V.  Anemia of chronic disease- Status post transfusion of 2 units pRBC's  (on admission, and on 1/22) earlier this admission.  Ferritin elevated at 1511.   Hemoglobin 8.8>>>8.3>>>7.8  No overt bleeding reported. - CBC am  Physical Exam: Vitals:   06/29/22 1511 06/29/22 2015 06/30/22 0331 06/30/22 0357  BP: 128/76 (!) 160/85  (!) 145/102  Pulse: 99 88   93  Resp: 20 19  18   Temp: 98 F (36.7 C) 98.2 F (36.8 C)  99.1 F (37.3 C)  TempSrc:      SpO2: 100% 100%  99%  Weight:   48.2 kg   Height:       General exam: Frail appearing in no acute distress. HEENT: moist mucus membranes, hearing grossly normal  Respiratory system: equal air entry b/l. Normal effort. Cardiovascular system: Regular rate and rhythm no rubs or gallops. Gastrointestinal system: Soft, non-tender, J tube in place and insertion site without drainage or signs of infection. Surgical incision healing well with mild oozing. Staples removed Central nervous system: A&O x 3. no gross focal neurologic deficits, normal speech Extremities: moves all , no edema, normal tone Skin: dry, intact, normal temperature Psychiatry: Mood is appropriate for condition and setting.    Data Reviewed:    Latest Ref Rng & Units 06/30/2022    4:01 AM 06/29/2022    3:55 PM 06/29/2022    2:20 PM  BMP  Glucose 70 - 99 mg/dL 151  59  63   BUN 6 - 20 mg/dL 75  73  75   Creatinine 0.44 - 1.00 mg/dL 1.19  1.19  1.14   Sodium 135 - 145 mmol/L 140  138  139   Potassium 3.5 - 5.1 mmol/L 5.1  5.5  5.0   Chloride 98 -  111 mmol/L 105  104  105   CO2 22 - 32 mmol/L 28  27  25    Calcium 8.9 - 10.3 mg/dL 8.5  8.4  8.7     Disposition: Status is: Inpatient Remains inpatient appropriate because: continues to have poorly controlled electrolytes and blood sugars    Planned Discharge Destination: home, possibly 2/8 if insulin regimen and blood sugars stable  Time spent: 42 minutes  Author: Richarda Osmond, MD 06/30/2022 7:22 AM  For on call review [email or web address].

## 2022-07-01 DIAGNOSIS — N1832 Chronic kidney disease, stage 3b: Secondary | ICD-10-CM | POA: Diagnosis not present

## 2022-07-01 DIAGNOSIS — E2740 Unspecified adrenocortical insufficiency: Secondary | ICD-10-CM | POA: Diagnosis not present

## 2022-07-01 DIAGNOSIS — E43 Unspecified severe protein-calorie malnutrition: Secondary | ICD-10-CM | POA: Diagnosis not present

## 2022-07-01 DIAGNOSIS — R571 Hypovolemic shock: Secondary | ICD-10-CM | POA: Diagnosis not present

## 2022-07-01 DIAGNOSIS — E1011 Type 1 diabetes mellitus with ketoacidosis with coma: Secondary | ICD-10-CM | POA: Diagnosis not present

## 2022-07-01 LAB — BASIC METABOLIC PANEL
Anion gap: 7 (ref 5–15)
BUN: 72 mg/dL — ABNORMAL HIGH (ref 6–20)
CO2: 27 mmol/L (ref 22–32)
Calcium: 8.2 mg/dL — ABNORMAL LOW (ref 8.9–10.3)
Chloride: 106 mmol/L (ref 98–111)
Creatinine, Ser: 1.16 mg/dL — ABNORMAL HIGH (ref 0.44–1.00)
GFR, Estimated: >60 mL/min (ref >60)
Glucose, Bld: 112 mg/dL — ABNORMAL HIGH (ref 70–99)
Potassium: 5.2 mmol/L — ABNORMAL HIGH (ref 3.5–5.1)
Sodium: 140 mmol/L (ref 135–145)

## 2022-07-01 LAB — CBC
HCT: 22.5 % — ABNORMAL LOW (ref 36.0–46.0)
Hemoglobin: 7.5 g/dL — ABNORMAL LOW (ref 12.0–15.0)
MCH: 31.5 pg (ref 26.0–34.0)
MCHC: 33.3 g/dL (ref 30.0–36.0)
MCV: 94.5 fL (ref 80.0–100.0)
Platelets: 198 10*3/uL (ref 150–400)
RBC: 2.38 MIL/uL — ABNORMAL LOW (ref 3.87–5.11)
RDW: 13.3 % (ref 11.5–15.5)
WBC: 8.3 10*3/uL (ref 4.0–10.5)
nRBC: 0 % (ref 0.0–0.2)

## 2022-07-01 LAB — GLUCOSE, CAPILLARY
Glucose-Capillary: 106 mg/dL — ABNORMAL HIGH (ref 70–99)
Glucose-Capillary: 95 mg/dL (ref 70–99)
Glucose-Capillary: 100 mg/dL — ABNORMAL HIGH (ref 70–99)
Glucose-Capillary: 351 mg/dL — ABNORMAL HIGH (ref 70–99)
Glucose-Capillary: 430 mg/dL — ABNORMAL HIGH (ref 70–99)
Glucose-Capillary: 436 mg/dL — ABNORMAL HIGH (ref 70–99)
Glucose-Capillary: 166 mg/dL — ABNORMAL HIGH (ref 70–99)
Glucose-Capillary: 115 mg/dL — ABNORMAL HIGH (ref 70–99)
Glucose-Capillary: 49 mg/dL — ABNORMAL LOW (ref 70–99)
Glucose-Capillary: 122 mg/dL — ABNORMAL HIGH (ref 70–99)

## 2022-07-01 MED ORDER — ALBUTEROL SULFATE (2.5 MG/3ML) 0.083% IN NEBU
5.0000 mg | INHALATION_SOLUTION | Freq: Once | RESPIRATORY_TRACT | Status: AC
  Administered 2022-07-01: 5 mg via RESPIRATORY_TRACT
  Filled 2022-07-01: qty 6

## 2022-07-01 NOTE — Progress Notes (Signed)
Inpatient Diabetes Program Recommendations  AACE/ADA: New Consensus Statement on Inpatient Glycemic Control (2015)  Target Ranges:  Prepandial:   less than 140 mg/dL      Peak postprandial:   less than 180 mg/dL (1-2 hours)      Critically ill patients:  140 - 180 mg/dL   Lab Results  Component Value Date   GLUCAP 100 (H) 07/01/2022   HGBA1C 10.2 (H) 02/28/2022    Review of Glycemic Control  Latest Reference Range & Units 06/30/22 16:52 06/30/22 20:07 06/30/22 23:55 07/01/22 04:15 07/01/22 06:08 07/01/22 07:50  Glucose-Capillary 70 - 99 mg/dL 280 (H) 162 (H) 220 (H) 106 (H) 95 100 (H)  Diabetes history: DM 1 Outpatient Diabetes medications:  Novolog 2 unit tid with meals Lantus 4 units bid Current orders for Inpatient glycemic control:  Novolog 0-6 units q 4 hours Osmolite 65 ml/hr Levemir 5 units bid Novolog 2 units tid with meals   Inpatient Diabetes Program Recommendations:    Blood sugars improved today.  Still very uncertain regarding d/c disposition.  Will follow.   Thanks,  Adah Perl, RN, BC-ADM Inpatient Diabetes Coordinator Pager [phone]  (8a-5p)

## 2022-07-01 NOTE — Progress Notes (Signed)
. Progress Note   Patient: Ashley Camacho:[id number] DOB: 17-Aug-1990 DOA: 06/09/2022     22 DOS: the patient was seen and examined on 07/01/2022   Brief hospital course: 32 year old female brought to the emergency room for weakness, vomiting and lethargy.  She was found to be in diabetic ketoacidosis.  She also has a history of depression and stopped taking her medication secondary to cost.  Patient has a history of adrenal insufficiency and diabetes.   1/18.  Patient interested in having a place to stay to help take care of her.  Case discussed with psychiatry to see the patient in consultation.  Tapered off insulin drip to long-acting insulin. 1/19.  Patient agreeable to Dobbhoff feeding tube.  IR placed 1/20.  Dobbhoff tube has not advanced through the stomach on 3 x-rays.  General surgery consulted for jejunostomy tube placement.  Dobbhoff tube removed. 1/21.  Jejunostomy tube placed by Dr. Dahlia Byes. 1/22.  Fluid bolus for low BP;s & stress dose steroids. Transfused 1 units for Hbg 7.4.   1/23.  Patient feeling a little bit better.  Steroids tapered back to home Corteg.  Had some intermittent vomiting yesterday.  Up to 30 mL an hour with tube feeds by J-tube.   1/24: pt reports feeling miserable with recurrent N/V and abdominal discomfort.  Tube feeds just increased 40 >> 50 cc/hr.  Reduced TF rate to see if she feels better. 1/25: stopped Reglan, increased erythromycin, reduced TF rate to 30 cc/hr.  Given IV Lasix for dependent edema. 1/26: pt eating a sandwich, N/V improved today.  .  Given IV Lasix again. 1/28: no N/V past couple days, tolerating meals much better, requesting Glucerna shakes. Agreeable to resume mirtazapine for sleep, anxiety and appetite stimulation. 1/29: transition to nocturnal feeds tonight. She continues to do well.  Remeron helped w sleep. 1/30: hypoglycemic this AM (44) after being given insulin with glucose only 94 early AM.  Repeat 84, with tube feeds still  running. Transition to nocturnal feeds did not occur last night. 1/31: Nocturnal feeds started, she tolerated at 55 cc/hr (goal 75 cc/hr).  CBG very low at 19 this AM, recovered.  Insulin orders for tube feed coverage were adjusted. 2/2-2/5: adjusting feeds to account for blood sugar and electrolyte modification. Monitoring electrolytes. Patient near stable for dc. TOC engaged for placement to group home but patient states she can go home with [facility] if not able to find a placement. Staples removed 2/5. 2/6-2/7: continue to adjust feeding and insulin regimen to better control blood sugars and electrolytes.   07/01/22 patient feels well. Has had >24 hours of relatively stable glucose control. Endorses mild irritation at J tube insertion site. No nausea.   Assessment and Plan:  Hypotension 2/2 adrenal insufficiency- resolved BP is now at goal.  She is on Cortef.  DKA-resolved Type 1 diabetes mellitus-  Sugars have been labile since admission with fluctuating nutrition intake. With chronic steroid use and introduction of enteral feeds via J tube, blood sugar control has been an ongoing challenge.  - Insulin regimen being adjusted in conjunction with RD, diabetes coordinator, and pharmacy discussions. Patient would benefit from dexcom device, will need follow up with endocrinology first. - added glucose and dextrose PRN for hypoglycemia - monitor CBGs q4 hours - discharge is delayed due to safety concerns about home disease management. She has no insurance or PCP so unable to get [facility] at this time. Medicaid application is pending. She would not be able to afford  the insulin or the nutrition supplement if discharged. TOC is engaged to evaluate for charity care and PCP assignment.  - consult to RD to do 24 hour calorie count. Would be helpful to see what percentage of nutritional needs patient is capable of consuming via PO and if it would be possible to stop tube feeds.  Gastroparesis- j-tube in place and  healing well.  --continue erythromycin to 100 mg TID, can go up to 250 mg if needed.  Anemia of chronic disease- Status post transfusion of 2 units pRBC's  (on admission, and on 1/22) earlier this admission.  Ferritin elevated at 1511.   Hemoglobin 8.8>>>8.3>>>7.8?7.5  No overt bleeding reported. - CBC am  AKI on CKDII -resolved- Creatinine 2.58 on presentation and down to 1.1 with GFR greater than 60. Currently at baseline. - continue to monitor  Mild hyperkalemia- likely secondary to the nutrition feeds. K+ 5.2 - albuterol, insulin given - lokelma PRN - BMP am  Acute metabolic encephalopathy-resolved as of 06/17/2022 Resolved. Secondary to diabetic ketoacidosis.    Protein-calorie malnutrition, severe Jejunostomy tube placed on 1/21 by Dr. Dahlia Byes.  Started on tube feeds. Staples removed 2/5 --Appreciate Dietitian's recs 1/24>>29: started and titrated up tube feeds.  Started tolerating meals again, no N/V.  1/30: transitioning to nocturnal feeds, adjusting insulin accordingly 2/1- 2/8: continues on nocturnal feeds and tolerating well, rate gradually decreases as PO intake has improved.  - continue Remeron for appetite stimulant, mood disorder/s and sleep.  Hypophosphatemia  Refeeding Syndrome- Resolved, post repletion and remains stable. 3.7 on 2/6  Electrolyte abnormalities- due to poor PO intake, refeeding syndrome, feeding supplement  - monitor K+, Mg++, Na+, phos - albuterol and lokelma PRN for hyperkalemia - replete PRN  Adrenal insufficiency ([place]) Initially required stress dose steroids. Transitioned to PO Cortef on 1/24. - continue hydrocortisone 10mg  am 20mg  pm  Thrombocytopenia- resolved  Chronic diastolic CHF Last echo 72/0/9470 -- EF 50-55% and normal diastolic parameters, apparently prior EF as low as 30%. --Monitor volume status with strict I/O's and daily weights --Given IV Lasix 20 mg 1/22, 1/25, 1/26, 1/28  --Further diuresis as needed, assess daily  Major  depressive disorder, recurrent episode, moderate (HCC) Insomnia Seen by psychiatry. They started Prozac. Remeron at bedtime to help with mood, sleep and appetite stimulation. - melatonin PRN  Hypothyroidism TSH slightly high.  Restarted levothyroxine at a slightly higher dose.  Underweight BMI 17.83 History of eating disorder as well. Now with J tube, attempting to optimize nutrition status, but limited by recurrent abdominal pain and N/V.  Physical Exam: Vitals:   06/30/22 1525 06/30/22 2005 07/01/22 0410 07/01/22 0500  BP: 134/81 (!) 140/80 (!) 150/85   Pulse: 87 90 93   Resp: 19 18 18    Temp: 98 F (36.7 C) 97.8 F (36.6 C) 98.7 F (37.1 C)   TempSrc:      SpO2: 100% 100% 100%   Weight:    47.6 kg  Height:       General exam: Frail appearing in no acute distress. HEENT: moist mucus membranes, hearing grossly normal  Respiratory system: equal air entry b/l. Normal effort. Cardiovascular system: Regular rate and rhythm no rubs or gallops. Gastrointestinal system: Soft, non-tender, J tube in place and insertion site without drainage or signs of infection. Surgical incision healing well with mild oozing. Staples removed Central nervous system: A&O x 3. no gross focal neurologic deficits, normal speech Extremities: moves all , no edema, normal tone Skin: dry, intact, normal temperature Psychiatry:  Mood is appropriate for condition and setting.    Data Reviewed:    Latest Ref Rng & Units 07/01/2022    3:48 AM 06/30/2022    4:01 AM 06/29/2022    3:55 PM  BMP  Glucose 70 - 99 mg/dL 112  151  59   BUN 6 - 20 mg/dL 72  75  73   Creatinine 0.44 - 1.00 mg/dL 1.16  1.19  1.19   Sodium 135 - 145 mmol/L 140  140  138   Potassium 3.5 - 5.1 mmol/L 5.2  5.1  5.5   Chloride 98 - 111 mmol/L 106  105  104   CO2 22 - 32 mmol/L 27  28  27    Calcium 8.9 - 10.3 mg/dL 8.2  8.5  8.4     Disposition: Status is: Inpatient Remains inpatient appropriate because: continues to have poorly  controlled electrolytes and blood sugars    Planned Discharge Destination: home, possibly 2/9 if insulin regimen and blood sugars stable and able to establish safe medication adherence at home  Time spent: 42 minutes  Author: Richarda Osmond, MD 07/01/2022 7:21 AM  For on call review [email or web address].

## 2022-07-01 NOTE — Progress Notes (Signed)
Nutrition Follow-up  DOCUMENTATION CODES:   Underweight, Severe malnutrition in context of chronic illness  INTERVENTION:   -48 hour calorie count per MD -Continue Glucerna Shake po TID, each supplement provides 220 kcal and 10 grams of protein  -TF via j-tube:   Osmolite 1.5 @ 65 ml/hr over 12 hours   30 ml free water flush every 4 hours   Tube feeding regimen provides 11170 kcal (71% of needs), 49 grams of protein, and 593 ml of H2O.  Total free water: 773 ml daily  NUTRITION DIAGNOSIS:   Severe Malnutrition related to social / environmental circumstances as evidenced by moderate muscle depletion, severe muscle depletion, moderate fat depletion, severe fat depletion, percent weight loss.  Ongoing  GOAL:   Patient will meet greater than or equal to 90% of their needs  Progressing   MONITOR:   PO intake, Supplement acceptance, Diet advancement  REASON FOR ASSESSMENT:   Consult Assessment of nutrition requirement/status, Calorie Count  ASSESSMENT:   Pt with PMH of type 1 DM, HTN, gastroparesis, anemia, and adrenal insufficiency admitted for evaluation of weakness and vomiting.  1/19- IR placed NGT 1/21- s/p open j-tube placement  Reviewed I/O's: +2.2 L x 24 hours and +20.9 L since 06/17/22   Case discussed with DM coordinator, TOC, and MD. Pt's discharge plan difficult secondary to lack of insurance (Medicaid pending). Per TOC notes, pt has not been accepted into any group home and is requesting to go home. Pt currently is uninsured and has no PCP, so has no resources to afford medications and TF. Pt's intake has improved and MD is requesting calorie count to determine pt if is eating adequately enough to sustain herself with oral intake only. Pt remains on nocturnal feeds for nutrition support.   Medications reviewed and include ferrous sulfate and remeron.    Labs reviewed: K: 5.2, CBGS: 166-436 (inpatient orders for glycemic control are 0-6 units insulin aspart  every 4 hours, 2 units insulin aspart TID with meals, and 5 units insulin detemir BID).    Diet Order:   Diet Order             DIET SOFT Room service appropriate? Yes; Fluid consistency: Thin  Diet effective now                   EDUCATION NEEDS:   Education needs have been addressed  Skin:  Skin Assessment: Skin Integrity Issues: Skin Integrity Issues:: Incisions Incisions: closed abdomen s/p j-tube placement  Last BM:  06/30/22  Height:   Ht Readings from Last 1 Encounters:  06/10/22 5\' 1"  (1.549 m)    Weight:   Wt Readings from Last 1 Encounters:  07/01/22 47.6 kg    Ideal Body Weight:  47.7 kg  BMI:  Body mass index is 19.83 kg/m.  Estimated Nutritional Needs:   Kcal:  1650-1850  Protein:  80-95 grams  Fluid:  > 1.6 L    Loistine Chance, RD, LDN, [place] Registered Dietitian II Certified Diabetes Care and Education Specialist Please refer to [facility] for RD and/or RD on-call/weekend/after hours pager

## 2022-07-01 NOTE — Progress Notes (Signed)
   07/01/22 1300  Spiritual Encounters  Type of Visit Initial;Follow up  Care provided to: Patient  Referral source Nurse (RN/NT/LPN)  Reason for visit Advance directives  OnCall Visit No  Spiritual Framework  Presenting Themes Community and relationships  Patient Stress Factors None identified  Interventions  Spiritual Care Interventions Made Established relationship of care and support  Ashley Camacho will continue to follow   Spoke with patient give her education on an Advance Directive.

## 2022-07-02 DIAGNOSIS — I5042 Chronic combined systolic (congestive) and diastolic (congestive) heart failure: Secondary | ICD-10-CM | POA: Diagnosis not present

## 2022-07-02 DIAGNOSIS — E10649 Type 1 diabetes mellitus with hypoglycemia without coma: Secondary | ICD-10-CM

## 2022-07-02 DIAGNOSIS — E43 Unspecified severe protein-calorie malnutrition: Secondary | ICD-10-CM | POA: Diagnosis not present

## 2022-07-02 DIAGNOSIS — Z934 Other artificial openings of gastrointestinal tract status: Secondary | ICD-10-CM | POA: Diagnosis not present

## 2022-07-02 DIAGNOSIS — K3184 Gastroparesis: Secondary | ICD-10-CM | POA: Diagnosis not present

## 2022-07-02 DIAGNOSIS — D638 Anemia in other chronic diseases classified elsewhere: Secondary | ICD-10-CM | POA: Diagnosis not present

## 2022-07-02 LAB — CBC
HCT: 22.3 % — ABNORMAL LOW (ref 36.0–46.0)
Hemoglobin: 7.3 g/dL — ABNORMAL LOW (ref 12.0–15.0)
MCH: 31.2 pg (ref 26.0–34.0)
MCHC: 32.7 g/dL (ref 30.0–36.0)
MCV: 95.3 fL (ref 80.0–100.0)
Platelets: 200 10*3/uL (ref 150–400)
RBC: 2.34 MIL/uL — ABNORMAL LOW (ref 3.87–5.11)
RDW: 13.7 % (ref 11.5–15.5)
WBC: 8.1 10*3/uL (ref 4.0–10.5)
nRBC: 0 % (ref 0.0–0.2)

## 2022-07-02 LAB — BASIC METABOLIC PANEL
Anion gap: 5 (ref 5–15)
BUN: 74 mg/dL — ABNORMAL HIGH (ref 6–20)
CO2: 27 mmol/L (ref 22–32)
Calcium: 8.1 mg/dL — ABNORMAL LOW (ref 8.9–10.3)
Chloride: 107 mmol/L (ref 98–111)
Creatinine, Ser: 1.13 mg/dL — ABNORMAL HIGH (ref 0.44–1.00)
GFR, Estimated: >60 mL/min (ref >60)
Glucose, Bld: 86 mg/dL (ref 70–99)
Potassium: 6.1 mmol/L — ABNORMAL HIGH (ref 3.5–5.1)
Sodium: 139 mmol/L (ref 135–145)

## 2022-07-02 LAB — GLUCOSE, CAPILLARY
Glucose-Capillary: 106 mg/dL — ABNORMAL HIGH (ref 70–99)
Glucose-Capillary: 109 mg/dL — ABNORMAL HIGH (ref 70–99)
Glucose-Capillary: 152 mg/dL — ABNORMAL HIGH (ref 70–99)
Glucose-Capillary: 191 mg/dL — ABNORMAL HIGH (ref 70–99)
Glucose-Capillary: 222 mg/dL — ABNORMAL HIGH (ref 70–99)
Glucose-Capillary: 228 mg/dL — ABNORMAL HIGH (ref 70–99)
Glucose-Capillary: 229 mg/dL — ABNORMAL HIGH (ref 70–99)
Glucose-Capillary: 86 mg/dL (ref 70–99)
Glucose-Capillary: 86 mg/dL (ref 70–99)
Glucose-Capillary: 90 mg/dL (ref 70–99)

## 2022-07-02 MED ORDER — INSULIN ASPART 100 UNIT/ML IJ SOLN
1.0000 [IU] | Freq: Three times a day (TID) | INTRAMUSCULAR | Status: DC
  Administered 2022-07-03 – 2022-07-07 (×12): 1 [IU] via SUBCUTANEOUS
  Filled 2022-07-02 (×13): qty 1

## 2022-07-02 MED ORDER — ERYTHROMYCIN ETHYLSUCCINATE 200 MG/5ML PO SUSR
100.0000 mg | Freq: Two times a day (BID) | ORAL | Status: DC
  Administered 2022-07-03 – 2022-07-04 (×3): 100 mg via ORAL
  Filled 2022-07-02 (×3): qty 2.5

## 2022-07-02 MED ORDER — SODIUM ZIRCONIUM CYCLOSILICATE 10 G PO PACK
10.0000 g | PACK | Freq: Once | ORAL | Status: AC
  Administered 2022-07-02: 10 g via ORAL
  Filled 2022-07-02: qty 1

## 2022-07-02 MED ORDER — FUROSEMIDE 10 MG/ML IJ SOLN
20.0000 mg | Freq: Once | INTRAMUSCULAR | Status: AC
  Administered 2022-07-02: 20 mg via INTRAVENOUS
  Filled 2022-07-02: qty 2

## 2022-07-02 NOTE — Progress Notes (Signed)
Calorie Count Note  48 hour calorie count ordered.  Diet: soft Supplements: Glucerna Shake po TID, each supplement provides 220 kcal and 10 grams of protein; nocturnal feeds via j-tube: Osmolite 1.5 @ 65 ml/hr x 12 hours  2/8-2/9 Breakfast: 995 kcals, 39 grams protein Lunch: nothing recorded yet Dinner: 739 kcals, 33 grams protein Supplements: 1 Glucerna (220 kcals, 10 grams protein)  Total intake: 1954 kcal (100% of minimum estimated needs)  82 grams protein (100% of minimum estimated needs)  Nutrition Dx: Severe Malnutrition related to social / environmental circumstances as evidenced by moderate muscle depletion, severe muscle depletion, moderate fat depletion, severe fat depletion, percent weight loss; ongoing  Goal: Patient will meet greater than or equal to 90% of their needs; progressing   Intervention:   -Continue 48 hour calorie count per MD; RD to follow-up on Monday, 07/05/22 -Continue Glucerna Shake po TID, each supplement provides 220 kcal and 10 grams of protein  -TF via j-tube:   Osmolite 1.5 @ 65 ml/hr over 12 hours   30 ml free water flush every 4 hours   Tube feeding regimen provides 11170 kcal (71% of needs), 49 grams of protein, and 593 ml of H2O.  Total free water: 773 ml daily    Loistine Chance, RD, LDN, [place] Registered Dietitian II Certified Diabetes Care and Education Specialist Please refer to [facility] for RD and/or RD on-call/weekend/after hours pager

## 2022-07-02 NOTE — Progress Notes (Signed)
Inpatient Diabetes Program Recommendations  AACE/ADA: New Consensus Statement on Inpatient Glycemic Control (2015)  Target Ranges:  Prepandial:   less than 140 mg/dL      Peak postprandial:   less than 180 mg/dL (1-2 hours)      Critically ill patients:  140 - 180 mg/dL   Lab Results  Component Value Date   GLUCAP 228 (H) 07/02/2022   HGBA1C 10.2 (H) 02/28/2022    Review of Glycemic Control  Latest Reference Range & Units 07/02/22 00:22 07/02/22 02:11 07/02/22 04:08 07/02/22 06:08 07/02/22 08:05 07/02/22 09:54  Glucose-Capillary 70 - 99 mg/dL 152 (H) 106 (H) 86 86 90 228 (H)  Diabetes history: DM 1 Outpatient Diabetes medications:  Novolog 2 unit tid with meals Lantus 4 units bid Current orders for Inpatient glycemic control:  Novolog 0-6 units q 4 hours Osmolite 65 ml/hr Levemir 5 units bid Novolog 2 units tid with meals   Inpatient Diabetes Program Recommendations:    Please reduce Novolog meal coverage to 1 units tid with meals.  Note that calorie count in process.  If tube feeds are going to be reduced/stopped, will need reduction in insulins as well.   Collaborative team still working on safe discharge plan for patient.   Thanks,  Adah Perl, RN, BC-ADM Inpatient Diabetes Coordinator Pager [phone]  (8a-5p)

## 2022-07-02 NOTE — Progress Notes (Signed)
. Progress Note   Patient: Ashley Camacho [id number] DOB: [birth year] DOA: 06/09/2022     23 DOS: the patient was seen and examined on 07/02/2022   Brief hospital course: 32 year old female brought to the emergency room for weakness, vomiting and lethargy.  She was found to be in diabetic ketoacidosis.  She also has a history of depression and stopped taking her medication secondary to cost.  Patient has a history of adrenal insufficiency and diabetes.   1/18.  Patient interested in having a place to stay to help take care of her.  Case discussed with psychiatry to see the patient in consultation.  Tapered off insulin drip to long-acting insulin. 1/19.  Patient agreeable to Dobbhoff feeding tube.  IR placed 1/20.  Dobbhoff tube has not advanced through the stomach on 3 x-rays.  General surgery consulted for jejunostomy tube placement.  Dobbhoff tube removed. 1/21.  Jejunostomy tube placed by Dr. Dahlia Byes. 1/22.  Fluid bolus for low BP;s & stress dose steroids. Transfused 1 units for Hbg 7.4.   1/23.  Patient feeling a little bit better.  Steroids tapered back to home Corteg.  Had some intermittent vomiting yesterday.  Up to 30 mL an hour with tube feeds by J-tube.   1/24: pt reports feeling miserable with recurrent N/V and abdominal discomfort.  Tube feeds just increased 40 >> 50 cc/hr.  Reduced TF rate to see if she feels better. 1/25: stopped Reglan, increased erythromycin, reduced TF rate to 30 cc/hr.  Given IV Lasix for dependent edema. 1/26: pt eating a sandwich, N/V improved today.  .  Given IV Lasix again. 1/28: no N/V past couple days, tolerating meals much better, requesting Glucerna shakes. Agreeable to resume mirtazapine for sleep, anxiety and appetite stimulation. 1/29: transition to nocturnal feeds tonight. She continues to do well.  Remeron helped w sleep. 1/30: hypoglycemic this AM (44) after being given insulin with glucose only 94 early AM.  Repeat 84, with tube feeds still  running. Transition to nocturnal feeds did not occur last night. 1/31: Nocturnal feeds started, she tolerated at 55 cc/hr (goal 75 cc/hr).  CBG very low at 19 this AM, recovered.  Insulin orders for tube feed coverage were adjusted. 2/2-2/5: adjusting feeds to account for blood sugar and electrolyte modification. Monitoring electrolytes. Patient near stable for dc. TOC engaged for placement to group home but patient states she can go home with [facility] if not able to find a placement. Staples removed 2/5. 2/6-2/7: continue to adjust feeding and insulin regimen to better control blood sugars and electrolytes.   2/8 - patient feels well. Has had >24 hours of relatively stable glucose control. Endorses mild irritation at J tube insertion site. No nausea.  2/9 - calorie count underway.  Pt has not been trained to manage tube feeds herself.  Assessment and Plan:  Hypotension 2/2 adrenal insufficiency- resolved BP is now at goal.  She is on Cortef.  DKA-resolved Type 1 diabetes mellitus-  Sugars have been labile since admission with fluctuating nutrition intake. With chronic steroid use and introduction of enteral feeds via J tube, blood sugar control has been an ongoing challenge.  - Insulin regimen being adjusted in conjunction with RD, diabetes coordinator, and pharmacy discussions. Patient would benefit from dexcom device, will need follow up with endocrinology first. - added glucose and dextrose PRN for hypoglycemia - monitor CBGs q4 hours - discharge is delayed due to safety concerns about home disease management. She has no insurance or PCP so unable  to get [facility] at this time. Medicaid application is pending. She would not be able to afford the insulin or the nutrition supplement if discharged. TOC is engaged to evaluate for charity care and PCP assignment.  - consult to RD to do 24 hour calorie count. Would be helpful to see what percentage of nutritional needs patient is capable of consuming via PO and  if it would be possible to stop tube feeds.  Gastroparesis- j-tube in place and healing well.  --continue erythromycin to 100 mg TID, can go up to 250 mg if needed. - GI recommended limit 2 weeks of eythromycin.  Will reduce it TID >> BID and taper it off, monitoring for recurrent motility issues or N/V recurrence.  She's been doing quite well tolerating PO intake since on erythromycin.  Anemia of chronic disease- Status post transfusion of 2 units pRBC's  (on admission, and on 1/22) earlier this admission.  Ferritin elevated at 1511.   Hemoglobin 8.8>>>8.3>>>7.8>>7.5>>7.3   No overt bleeding reported. - CBC am  AKI on CKDII -resolved- Creatinine 2.58 on presentation and down to 1.1 with GFR greater than 60.  Currently at baseline. - continue to monitor  Hhyperkalemia- likely secondary to the nutrition feeds. K+ 5.2 >> 6.1 this AM - 20 mg IV Lasix x 1 - 10 g Lokelma  - BMP am  Acute metabolic encephalopathy-resolved as of 06/17/2022 Resolved. Secondary to diabetic ketoacidosis.    Protein-calorie malnutrition, severe Jejunostomy tube placed on 1/21 by Dr. Dahlia Byes.  Started on tube feeds. Staples removed 2/5 --Appreciate Dietitian's recs 1/24>>29: started and titrated up tube feeds.  Started tolerating meals again, no N/V.  1/30: transitioning to nocturnal feeds, adjusting insulin accordingly 2/1- 2/8: continues on nocturnal feeds and tolerating well, rate gradually decreases as PO intake has improved.  - continue Remeron for appetite stimulant, mood disorder/s and sleep. - Nursing staff to train / educate patient with tube feed management  Hypophosphatemia  Refeeding Syndrome- Resolved, post repletion and remains stable. 3.7 on 2/6  Electrolyte abnormalities- due to poor PO intake, refeeding syndrome, feeding supplement  - monitor K+, Mg++, Na+, phos - albuterol and lokelma PRN for hyperkalemia - replete PRN  Adrenal insufficiency ([place]) Initially required stress dose steroids.  Transitioned to PO Cortef on 1/24. - continue hydrocortisone 59m am 26mpm  Thrombocytopenia- resolved  Chronic diastolic CHF Last echo [id number]- EF 50-55% and normal diastolic parameters, apparently prior EF as low as 30%. --Monitor volume status with strict I/O's and daily weights --Given IV Lasix 20 mg 1/22, 1/25, 1/26, 1/28, 2/9 (for hyperkalemia)  --Further diuresis as needed, assess daily  Major depressive disorder, recurrent episode, moderate (HCC) Insomnia Seen by psychiatry. They started Prozac. Remeron at bedtime to help with mood, sleep and appetite stimulation. - melatonin PRN  Hypothyroidism TSH slightly high.  Restarted levothyroxine at a slightly higher dose.  Underweight BMI 17.83 History of eating disorder as well. Now with J tube, attempting to optimize nutrition status, but limited by recurrent abdominal pain and N/V.  Physical Exam: Vitals:   07/01/22 2004 07/02/22 0406 07/02/22 0500 07/02/22 0817  BP: (!) 142/73 (!) 151/87  130/78  Pulse: (!) 104 (!) 102  91  Resp: 16 16  18  $ Temp: 98.1 F (36.7 C) 98.4 F (36.9 C)  97.8 F (36.6 C)  TempSrc: Oral Oral    SpO2: 100% 99%  100%  Weight:   48.2 kg   Height:       General exam: Frail appearing  in no acute distress. HEENT: moist mucus membranes, hearing grossly normal  Respiratory system: equal air entry b/l. Normal effort. Cardiovascular system: Regular rate and rhythm no rubs or gallops. Gastrointestinal system: Soft, non-tender, J tube in place and insertion site without drainage or signs of infection. Surgical incision healing well with mild oozing. Staples removed Central nervous system: A&O x 3. no gross focal neurologic deficits, normal speech Extremities: moves all , no edema, normal tone Skin: dry, intact, normal temperature Psychiatry: Mood is appropriate for condition and setting.    Data Reviewed:    Latest Ref Rng & Units 07/02/2022    4:46 AM 07/01/2022    3:48 AM 06/30/2022     4:01 AM  BMP  Glucose 70 - 99 mg/dL 86  112  151   BUN 6 - 20 mg/dL 74  72  75   Creatinine 0.44 - 1.00 mg/dL 1.13  1.16  1.19   Sodium 135 - 145 mmol/L 139  140  140   Potassium 3.5 - 5.1 mmol/L 6.1  5.2  5.1   Chloride 98 - 111 mmol/L 107  106  105   CO2 22 - 32 mmol/L 27  27  28   $ Calcium 8.9 - 10.3 mg/dL 8.1  8.2  8.5     Disposition: Status is: Inpatient Remains inpatient appropriate because: continues to have poorly controlled electrolytes and blood sugars.  Needs tube feed training in preparation for d/c.    Planned Discharge Destination: home after calorie count is completed and patient trained adequately on managing tube feeds if they will be required at home.  Time spent: 42 minutes  Author: Ezekiel Slocumb, DO 07/02/2022 2:00 PM  For on call review [email or web address].

## 2022-07-03 DIAGNOSIS — D638 Anemia in other chronic diseases classified elsewhere: Secondary | ICD-10-CM | POA: Diagnosis not present

## 2022-07-03 DIAGNOSIS — E43 Unspecified severe protein-calorie malnutrition: Secondary | ICD-10-CM | POA: Diagnosis not present

## 2022-07-03 DIAGNOSIS — E10649 Type 1 diabetes mellitus with hypoglycemia without coma: Secondary | ICD-10-CM | POA: Diagnosis not present

## 2022-07-03 DIAGNOSIS — Z934 Other artificial openings of gastrointestinal tract status: Secondary | ICD-10-CM | POA: Diagnosis not present

## 2022-07-03 DIAGNOSIS — I5042 Chronic combined systolic (congestive) and diastolic (congestive) heart failure: Secondary | ICD-10-CM | POA: Diagnosis not present

## 2022-07-03 DIAGNOSIS — K3184 Gastroparesis: Secondary | ICD-10-CM | POA: Diagnosis not present

## 2022-07-03 LAB — CBC
HCT: 23.7 % — ABNORMAL LOW (ref 36.0–46.0)
Hemoglobin: 7.7 g/dL — ABNORMAL LOW (ref 12.0–15.0)
MCH: 31.2 pg (ref 26.0–34.0)
MCHC: 32.5 g/dL (ref 30.0–36.0)
MCV: 96 fL (ref 80.0–100.0)
Platelets: 204 10*3/uL (ref 150–400)
RBC: 2.47 MIL/uL — ABNORMAL LOW (ref 3.87–5.11)
RDW: 13.7 % (ref 11.5–15.5)
WBC: 8.5 10*3/uL (ref 4.0–10.5)
nRBC: 0 % (ref 0.0–0.2)

## 2022-07-03 LAB — GLUCOSE, CAPILLARY
Glucose-Capillary: 108 mg/dL — ABNORMAL HIGH (ref 70–99)
Glucose-Capillary: 128 mg/dL — ABNORMAL HIGH (ref 70–99)
Glucose-Capillary: 132 mg/dL — ABNORMAL HIGH (ref 70–99)
Glucose-Capillary: 148 mg/dL — ABNORMAL HIGH (ref 70–99)
Glucose-Capillary: 158 mg/dL — ABNORMAL HIGH (ref 70–99)
Glucose-Capillary: 160 mg/dL — ABNORMAL HIGH (ref 70–99)
Glucose-Capillary: 162 mg/dL — ABNORMAL HIGH (ref 70–99)
Glucose-Capillary: 173 mg/dL — ABNORMAL HIGH (ref 70–99)
Glucose-Capillary: 174 mg/dL — ABNORMAL HIGH (ref 70–99)
Glucose-Capillary: 300 mg/dL — ABNORMAL HIGH (ref 70–99)
Glucose-Capillary: 91 mg/dL (ref 70–99)

## 2022-07-03 LAB — BASIC METABOLIC PANEL
Anion gap: 8 (ref 5–15)
BUN: 77 mg/dL — ABNORMAL HIGH (ref 6–20)
CO2: 25 mmol/L (ref 22–32)
Calcium: 8.4 mg/dL — ABNORMAL LOW (ref 8.9–10.3)
Chloride: 106 mmol/L (ref 98–111)
Creatinine, Ser: 1.46 mg/dL — ABNORMAL HIGH (ref 0.44–1.00)
GFR, Estimated: 49 mL/min — ABNORMAL LOW (ref >60)
Glucose, Bld: 195 mg/dL — ABNORMAL HIGH (ref 70–99)
Potassium: 5.5 mmol/L — ABNORMAL HIGH (ref 3.5–5.1)
Sodium: 139 mmol/L (ref 135–145)

## 2022-07-03 LAB — MAGNESIUM: Magnesium: 1.9 mg/dL (ref 1.7–2.4)

## 2022-07-03 LAB — PHOSPHORUS: Phosphorus: 4.1 mg/dL (ref 2.5–4.6)

## 2022-07-03 MED ORDER — SODIUM ZIRCONIUM CYCLOSILICATE 10 G PO PACK
10.0000 g | PACK | Freq: Once | ORAL | Status: AC
  Administered 2022-07-03: 10 g via ORAL
  Filled 2022-07-03: qty 1

## 2022-07-03 MED ORDER — NEPRO/CARBSTEADY PO LIQD
237.0000 mL | ORAL | Status: DC
  Administered 2022-07-03: 237 mL via ORAL

## 2022-07-03 NOTE — Progress Notes (Signed)
Mobility Specialist - Progress Note    07/03/22 1500  Mobility  Activity Ambulated independently in hallway  Level of Assistance Independent  Assistive Device None  Distance Ambulated (ft) 640 ft  Activity Response Tolerated well  Mobility Referral Yes  $Mobility charge 1 Mobility    [employer] Mobility Specialist 07/03/22, 3:11 PM

## 2022-07-03 NOTE — Progress Notes (Signed)
. Progress Note   Patient: Ashley Camacho [id number] DOB: [birth year] DOA: 06/09/2022     24 DOS: the patient was seen and examined on 07/03/2022   Brief hospital course: 32 year old female brought to the emergency room for weakness, vomiting and lethargy.  She was found to be in diabetic ketoacidosis.  She also has a history of depression and stopped taking her medication secondary to cost.  Patient has a history of adrenal insufficiency and diabetes.   1/18.  Patient interested in having a place to stay to help take care of her.  Case discussed with psychiatry to see the patient in consultation.  Tapered off insulin drip to long-acting insulin. 1/19.  Patient agreeable to Dobbhoff feeding tube.  IR placed 1/20.  Dobbhoff tube has not advanced through the stomach on 3 x-rays.  General surgery consulted for jejunostomy tube placement.  Dobbhoff tube removed. 1/21.  Jejunostomy tube placed by Dr. Dahlia Byes. 1/22.  Fluid bolus for low BP;s & stress dose steroids. Transfused 1 units for Hbg 7.4.   1/23.  Patient feeling a little bit better.  Steroids tapered back to home Corteg.  Had some intermittent vomiting yesterday.  Up to 30 mL an hour with tube feeds by J-tube.   1/24: pt reports feeling miserable with recurrent N/V and abdominal discomfort.  Tube feeds just increased 40 >> 50 cc/hr.  Reduced TF rate to see if she feels better. 1/25: stopped Reglan, increased erythromycin, reduced TF rate to 30 cc/hr.  Given IV Lasix for dependent edema. 1/26: pt eating a sandwich, N/V improved today.  .  Given IV Lasix again. 1/28: no N/V past couple days, tolerating meals much better, requesting Glucerna shakes. Agreeable to resume mirtazapine for sleep, anxiety and appetite stimulation. 1/29: transition to nocturnal feeds tonight. She continues to do well.  Remeron helped w sleep. 1/30: hypoglycemic this AM (44) after being given insulin with glucose only 94 early AM.  Repeat 84, with tube feeds still  running. Transition to nocturnal feeds did not occur last night. 1/31: Nocturnal feeds started, she tolerated at 55 cc/hr (goal 75 cc/hr).  CBG very low at 19 this AM, recovered.  Insulin orders for tube feed coverage were adjusted. 2/2-2/5: adjusting feeds to account for blood sugar and electrolyte modification. Monitoring electrolytes. Patient near stable for dc. TOC engaged for placement to group home but patient states she can go home with [facility] if not able to find a placement. Staples removed 2/5. 2/6-2/7: continue to adjust feeding and insulin regimen to better control blood sugars and electrolytes.   2/8 - patient feels well. Has had >24 hours of relatively stable glucose control. Endorses mild irritation at J tube insertion site. No nausea.  2/9--10 - calorie count underway.  Pt has not been trained to manage tube feeds herself.  Requested nurses pass along to night team to do so.  Assessment and Plan:  Hypotension 2/2 adrenal insufficiency- resolved BP is now at goal.  She is on Cortef.  DKA-resolved Type 1 diabetes mellitus-  Sugars have been labile since admission with fluctuating nutrition intake. With chronic steroid use and introduction of enteral feeds via J tube, blood sugar control has been an ongoing challenge.  - Insulin regimen being adjusted in conjunction with RD, diabetes coordinator, and pharmacy discussions. Patient would benefit from dexcom device, will need follow up with endocrinology first. - added glucose and dextrose PRN for hypoglycemia - monitor CBGs q4 hours - discharge is delayed due to safety concerns about  home disease management. She has no insurance or PCP so unable to get [facility] at this time. Medicaid application is pending. She would not be able to afford the insulin or the nutrition supplement if discharged. TOC is engaged to evaluate for charity care and PCP assignment.  - consult to RD to do 24 hour calorie count. Would be helpful to see what percentage of  nutritional needs patient is capable of consuming via PO and if it would be possible to stop tube feeds.  Gastroparesis- j-tube in place and healing well.  --continue erythromycin to 100 mg TID, can go up to 250 mg if needed. - GI recommended limit 2 weeks of eythromycin.   Reduced erythromycin TID >> BID on 2/9.   Will taper off, while monitoring for recurrent motility issues or N/V recurrence.  She's been doing quite well tolerating PO intake since on erythromycin.  Anemia of chronic disease- Status post transfusion of 2 units pRBC's  (on admission, and on 1/22) earlier this admission.  Ferritin elevated at 1511.   Hemoglobin 8.8>>>8.3>>>7.8>>7.5>>7.3  >>7.7 No overt bleeding reported. - CBC am  AKI on CKDII -resolved- Creatinine 2.58 on presentation and down to 1.1 with GFR greater than 60.  2/10 - mild AKI, Cr 1.49 after given Lasix yesterday for hyperkalemia - hold further diuresis, no need for fluids for now - repeat BMP in AM, gentle fluids if not improved - continue to monitor  Hhyperkalemia- likely secondary to the nutrition feeds. K+ 5.2 >> 6.1 >> 5.5 this AM - Change Glucerna shakes to Nepro  - Lokelma 10 g x 1 today - Yesterday given 20 mg IV Lasix x 1 and 10 g Lokelma - BMP am  Acute metabolic encephalopathy-resolved as of 06/17/2022 Resolved. Secondary to diabetic ketoacidosis.    Protein-calorie malnutrition, severe Jejunostomy tube placed on 1/21 by Dr. Dahlia Byes.  Started on tube feeds. Staples removed 2/5 --Appreciate Dietitian's recs 1/24>>29: started and titrated up tube feeds.  Started tolerating meals again, no N/V.  1/30: transitioning to nocturnal feeds, adjusting insulin accordingly 2/1- 2/8: continues on nocturnal feeds and tolerating well, rate gradually decreases as PO intake has improved.  - continue Remeron for appetite stimulant, mood disorder/s and sleep. - Nursing staff to train / educate patient with tube feed management  Hypophosphatemia  Refeeding  Syndrome- Resolved, post repletion and remains stable. 3.7 on 2/6  Electrolyte abnormalities- due to poor PO intake, refeeding syndrome, feeding supplement  - monitor K+, Mg++, Na+, phos - albuterol and lokelma PRN for hyperkalemia - replete PRN  Adrenal insufficiency ([place]) Initially required stress dose steroids. Transitioned to PO Cortef on 1/24. - continue hydrocortisone 51m am 272mpm  Thrombocytopenia- resolved  Chronic diastolic CHF Last echo [id number]- EF 50-55% and normal diastolic parameters, apparently prior EF as low as 30%. --Monitor volume status with strict I/O's and daily weights --Given IV Lasix 20 mg 1/22, 1/25, 1/26, 1/28, 2/9 (for hyperkalemia)  --Further diuresis as needed, assess daily  Major depressive disorder, recurrent episode, moderate (HCC) Insomnia Seen by psychiatry. They started Prozac. Remeron at bedtime to help with mood, sleep and appetite stimulation. - melatonin PRN  Hypothyroidism TSH slightly high.  Restarted levothyroxine at a slightly higher dose.  Underweight BMI 17.83 History of eating disorder as well. Now with J tube, attempting to optimize nutrition status, but limited by recurrent abdominal pain and N/V.   Subjective / Interval history Pt seen this AM, doing well overall.  She's been watching staff manage tube feeds  but has not been taught to do so on her own yet.  Staff had started this a couple of weeks ago, but apparently it has not continued.  She reports abdomen feeling more full today (erythromycin was reduced TID >> BID), but no nausea/vomiting.  No other acute complaints at this time.     Physical Exam: Vitals:   07/02/22 1653 07/02/22 2139 07/03/22 0436 07/03/22 0741  BP: (!) 159/90 (!) 142/85 (!) 141/79 119/73  Pulse: 97 94 94 95  Resp: 16 16 16   $ Temp: (!) 97.5 F (36.4 C) 98 F (36.7 C) 97.9 F (36.6 C) 97.8 F (36.6 C)  TempSrc: Oral Oral Oral   SpO2: 100% 100% 100% 100%  Weight:      Height:        General exam: awake, alert, no acute distress, frail but has gained weight HEENT: atraumatic, clear conjunctiva, anicteric sclera, moist mucus membranes, hearing grossly normal  Respiratory system: on room air, normal respiratory effort. Cardiovascular system: RRR, no pedal edema.   Gastrointestinal system: soft, J tube present. Central nervous system: A&O x4. no gross focal neurologic deficits, normal speech Extremities: moves all, no edema, normal tone Skin: dry, intact, normal temperature, normal color, No rashes, lesions or ulcers Psychiatry: normal mood, congruent affect, judgement and insight appear normal     Data Reviewed:    Latest Ref Rng & Units 07/03/2022    5:07 AM 07/02/2022    4:46 AM 07/01/2022    3:48 AM  BMP  Glucose 70 - 99 mg/dL 195  86  112   BUN 6 - 20 mg/dL 77  74  72   Creatinine 0.44 - 1.00 mg/dL 1.46  1.13  1.16   Sodium 135 - 145 mmol/L 139  139  140   Potassium 3.5 - 5.1 mmol/L 5.5  6.1  5.2   Chloride 98 - 111 mmol/L 106  107  106   CO2 22 - 32 mmol/L 25  27  27   $ Calcium 8.9 - 10.3 mg/dL 8.4  8.1  8.2     Disposition: Status is: Inpatient Remains inpatient appropriate because: continues to have poorly controlled electrolytes and blood sugars.  Needs tube feed training in preparation for d/c.    Planned Discharge Destination: home after calorie count is completed and patient trained adequately on managing tube feeds herself  Time spent: 36 minutes  Author: Ezekiel Slocumb, DO 07/03/2022 12:43 PM  For on call review [email or web address].

## 2022-07-03 NOTE — Progress Notes (Signed)
Mobility Specialist - Progress Note     07/03/22 0838  Mobility  Level of Assistance Independent  Assistive Device None  Distance Ambulated (ft) 640 ft  Activity Response Tolerated well  Mobility Referral Yes  $Mobility charge 1 Mobility   Pt resting EOB upon arrival on RA. Pt STS and ambulates to hallway indep for 4 laps. Pt returned to EOB and left with needs in reach.   Loma Sender Mobility Specialist 07/03/22, 8:40 AM

## 2022-07-03 NOTE — Progress Notes (Signed)
Inpatient Diabetes Program Recommendations  AACE/ADA: New Consensus Statement on Inpatient Glycemic Control (2015)  Target Ranges:  Prepandial:   less than 140 mg/dL      Peak postprandial:   less than 180 mg/dL (1-2 hours)      Critically ill patients:  140 - 180 mg/dL   Lab Results  Component Value Date   GLUCAP 132 (H) 07/03/2022   HGBA1C 10.2 (H) 02/28/2022    Review of Glycemic Control  Latest Reference Range & Units 07/03/22 00:51 07/03/22 02:30 07/03/22 04:34 07/03/22 06:33 07/03/22 07:43  Glucose-Capillary 70 - 99 mg/dL 300 (H) 160 (H) 173 (H) 108 (H) 132 (H)  Diabetes history: DM 1 Outpatient Diabetes medications:  Novolog 2 unit tid with meals Lantus 4 units bid Current orders for Inpatient glycemic control:  Novolog 0-6 units q 4 hours Osmolite 65 ml/hr Levemir 5 units bid Novolog 1 units tid with meals   Inpatient Diabetes Program Recommendations:    Blood sugars more stable overnight.  Will follow.   Thanks,  Adah Perl, RN, BC-ADM Inpatient Diabetes Coordinator Pager [phone]  (8a-5p)

## 2022-07-04 DIAGNOSIS — E43 Unspecified severe protein-calorie malnutrition: Secondary | ICD-10-CM | POA: Diagnosis not present

## 2022-07-04 DIAGNOSIS — K3184 Gastroparesis: Secondary | ICD-10-CM | POA: Diagnosis not present

## 2022-07-04 DIAGNOSIS — D638 Anemia in other chronic diseases classified elsewhere: Secondary | ICD-10-CM | POA: Diagnosis not present

## 2022-07-04 DIAGNOSIS — Z934 Other artificial openings of gastrointestinal tract status: Secondary | ICD-10-CM | POA: Diagnosis not present

## 2022-07-04 DIAGNOSIS — E10649 Type 1 diabetes mellitus with hypoglycemia without coma: Secondary | ICD-10-CM | POA: Diagnosis not present

## 2022-07-04 DIAGNOSIS — I5042 Chronic combined systolic (congestive) and diastolic (congestive) heart failure: Secondary | ICD-10-CM | POA: Diagnosis not present

## 2022-07-04 LAB — FOLATE: Folate: 10.6 ng/mL (ref >5.9)

## 2022-07-04 LAB — CBC
HCT: 22.3 % — ABNORMAL LOW (ref 36.0–46.0)
Hemoglobin: 7.2 g/dL — ABNORMAL LOW (ref 12.0–15.0)
MCH: 31.4 pg (ref 26.0–34.0)
MCHC: 32.3 g/dL (ref 30.0–36.0)
MCV: 97.4 fL (ref 80.0–100.0)
Platelets: 198 10*3/uL (ref 150–400)
RBC: 2.29 MIL/uL — ABNORMAL LOW (ref 3.87–5.11)
RDW: 13.8 % (ref 11.5–15.5)
WBC: 7.2 10*3/uL (ref 4.0–10.5)
nRBC: 0 % (ref 0.0–0.2)

## 2022-07-04 LAB — BASIC METABOLIC PANEL
Anion gap: 6 (ref 5–15)
BUN: 76 mg/dL — ABNORMAL HIGH (ref 6–20)
CO2: 26 mmol/L (ref 22–32)
Calcium: 8.3 mg/dL — ABNORMAL LOW (ref 8.9–10.3)
Chloride: 110 mmol/L (ref 98–111)
Creatinine, Ser: 1.33 mg/dL — ABNORMAL HIGH (ref 0.44–1.00)
GFR, Estimated: 55 mL/min — ABNORMAL LOW (ref >60)
Glucose, Bld: 160 mg/dL — ABNORMAL HIGH (ref 70–99)
Potassium: 5.1 mmol/L (ref 3.5–5.1)
Sodium: 142 mmol/L (ref 135–145)

## 2022-07-04 LAB — GLUCOSE, CAPILLARY
Glucose-Capillary: 117 mg/dL — ABNORMAL HIGH (ref 70–99)
Glucose-Capillary: 137 mg/dL — ABNORMAL HIGH (ref 70–99)
Glucose-Capillary: 151 mg/dL — ABNORMAL HIGH (ref 70–99)
Glucose-Capillary: 158 mg/dL — ABNORMAL HIGH (ref 70–99)
Glucose-Capillary: 191 mg/dL — ABNORMAL HIGH (ref 70–99)
Glucose-Capillary: 192 mg/dL — ABNORMAL HIGH (ref 70–99)
Glucose-Capillary: 196 mg/dL — ABNORMAL HIGH (ref 70–99)
Glucose-Capillary: 197 mg/dL — ABNORMAL HIGH (ref 70–99)
Glucose-Capillary: 222 mg/dL — ABNORMAL HIGH (ref 70–99)
Glucose-Capillary: 267 mg/dL — ABNORMAL HIGH (ref 70–99)
Glucose-Capillary: 270 mg/dL — ABNORMAL HIGH (ref 70–99)
Glucose-Capillary: 79 mg/dL (ref 70–99)

## 2022-07-04 LAB — VITAMIN B12: Vitamin B-12: 482 pg/mL (ref 180–914)

## 2022-07-04 MED ORDER — ERYTHROMYCIN ETHYLSUCCINATE 200 MG/5ML PO SUSR
100.0000 mg | Freq: Every day | ORAL | Status: DC
  Filled 2022-07-04: qty 2.5

## 2022-07-04 NOTE — Progress Notes (Signed)
Patient refused the Nepro Carb Steady. She stated that she does not like them.

## 2022-07-04 NOTE — Progress Notes (Signed)
. Progress Note   Patient: Ashley Camacho [id number] DOB: 1991/01/11 DOA: 06/09/2022     24 DOS: the patient was seen and examined on 07/03/2022   Brief hospital course: 32 year old female brought to the emergency room for weakness, vomiting and lethargy.  She was found to be in diabetic ketoacidosis.  She also has a history of depression and stopped taking her medication secondary to cost.  Patient has a history of adrenal insufficiency and diabetes.   1/18.  Patient interested in having a place to stay to help take care of her.  Case discussed with psychiatry to see the patient in consultation.  Tapered off insulin drip to long-acting insulin. 1/19.  Patient agreeable to Dobbhoff feeding tube.  IR placed 1/20.  Dobbhoff tube has not advanced through the stomach on 3 x-rays.  General surgery consulted for jejunostomy tube placement.  Dobbhoff tube removed. 1/21.  Jejunostomy tube placed by Dr. Dahlia Byes. 1/22.  Fluid bolus for low BP;s & stress dose steroids. Transfused 1 units for Hbg 7.4.   1/23.  Patient feeling a little bit better.  Steroids tapered back to home Corteg.  Had some intermittent vomiting yesterday.  Up to 30 mL an hour with tube feeds by J-tube.   1/24: pt reports feeling miserable with recurrent N/V and abdominal discomfort.  Tube feeds just increased 40 >> 50 cc/hr.  Reduced TF rate to see if she feels better. 1/25: stopped Reglan, increased erythromycin, reduced TF rate to 30 cc/hr.  Given IV Lasix for dependent edema. 1/26: pt eating a sandwich, N/V improved today.  .  Given IV Lasix again. 1/28: no N/V past couple days, tolerating meals much better, requesting Glucerna shakes. Agreeable to resume mirtazapine for sleep, anxiety and appetite stimulation. 1/29: transition to nocturnal feeds tonight. She continues to do well.  Remeron helped w sleep. 1/30: hypoglycemic this AM (44) after being given insulin with glucose only 94 early AM.  Repeat 84, with tube feeds still  running. Transition to nocturnal feeds did not occur last night. 1/31: Nocturnal feeds started, she tolerated at 55 cc/hr (goal 75 cc/hr).  CBG very low at 19 this AM, recovered.  Insulin orders for tube feed coverage were adjusted. 2/2-2/5: adjusting feeds to account for blood sugar and electrolyte modification. Monitoring electrolytes. Patient near stable for dc. TOC engaged for placement to group home but patient states she can go home with [facility] if not able to find a placement. Staples removed 2/5. 2/6-2/7: continue to adjust feeding and insulin regimen to better control blood sugars and electrolytes.   2/8 - patient feels well. Has had >24 hours of relatively stable glucose control. Endorses mild irritation at J tube insertion site. No nausea.  2/9--10 - calorie count underway.  Pt has not been trained to manage tube feeds herself.  Requested nurses pass along to night team to do so. 2/11 >> tube feed training underway.  RD to review calorie count and nutrition needs.   Assessment and Plan:  Hypotension 2/2 adrenal insufficiency- resolved BP is now at goal.  She is on Cortef.  DKA-resolved Type 1 diabetes mellitus-  Sugars have been labile since admission with fluctuating nutrition intake. With chronic steroid use and introduction of enteral feeds via J tube, blood sugar control has been an ongoing challenge.  - Insulin regimen being adjusted in conjunction with RD, diabetes coordinator, and pharmacy discussions.  - Patient would benefit from dexcom device, will need follow up with endocrinology first. - hypoglycemia protocol -  monitor CBGs q4 hours - discharge is delayed due to safety concerns about home disease management. She has no insurance or PCP so unable to get [facility] at this time. Medicaid application is pending. She would not be able to afford the insulin or the nutrition supplement if discharged. TOC is engaged to evaluate for charity care and PCP assignment.  - consult to RD to do 48  hour calorie count to see what percentage of nutritional needs patient is capable of consuming via PO and if it would be possible to stop tube feeds.   Gastroparesis- j-tube in place and healing well.  --continue erythromycin to 100 mg TID, can go up to 250 mg if needed. - GI recommended limit 2 weeks of eythromycin.   Reduced erythromycin TID >> BID on 2/9 >> once daily with lunch (2/11).   Will taper off erythromycin, while monitoring for recurrent motility issues or N/V recurrence.   She's been doing quite well tolerating PO intake since on erythromycin.   Anemia of chronic disease- Status post transfusion of 2 units pRBC's  (on admission, and on 1/22) earlier this admission.  Ferritin elevated at 1511 (acute phase?).  Iron studies do not show deficiency Hemoglobin 8.8>>>8.3>>>7.8>>7.5>>7.3  >>7.7 >> 7.2 No overt bleeding reported. - Check 123456 and folic acid levels - CBC am  AKI on CKDII -resolved- Creatinine 2.58 on presentation and down to 1.1 with GFR greater than 60.  2/10 - mild AKI, Cr 1.49 after given Lasix 2/9 for hyperkalemia. 2/11 - Cr improved 1.33 with no fluids, off diuretics - repeat BMP in AM, gentle fluids if not improved - continue to monitor  Hhyperkalemia- Resolved.  Likely secondary to the nutrition feeds. K+ 5.2 >> 6.1 (treated) >> 5.5 (treated) >> 5.1 - Changed Glucerna shakes to Nepro  - BMP am  Acute metabolic encephalopathy-resolved as of 06/17/2022 Resolved. Secondary to diabetic ketoacidosis.    Protein-calorie malnutrition, severe Jejunostomy tube placed on 1/21 by Dr. Dahlia Byes.  Started on tube feeds. Staples removed 2/5 --Appreciate Dietitian's recs 1/24>>29: started and titrated up tube feeds.  Started tolerating meals again, no N/V.  1/30: transitioning to nocturnal feeds, adjusting insulin accordingly 2/1- 2/11: continues on nocturnal feeds and tolerating well, rate gradually decreases as PO intake has improved.  - continue Remeron for appetite  stimulant, mood disorder/s and sleep. - Nursing staff to train / educate patient with tube feed management  Hypophosphatemia  Refeeding Syndrome- Resolved, post repletion and remains stable. 3.7 on 2/6  Electrolyte abnormalities- due to poor PO intake, refeeding syndrome, feeding supplement  - monitor K+, Mg++, Na+, phos - albuterol and lokelma PRN for hyperkalemia - replete PRN  Adrenal insufficiency ([place]) Initially required stress dose steroids. Transitioned to PO Cortef on 1/24. - continue hydrocortisone 4m am 247mpm  Thrombocytopenia- resolved  Chronic diastolic CHF Last echo [id number]- EF 50-55% and normal diastolic parameters, apparently prior EF as low as 30%. --Monitor volume status with strict I/O's and daily weights --Given IV Lasix 20 mg 1/22, 1/25, 1/26, 1/28, 2/9 (for hyperkalemia)  --Further diuresis as needed, assess daily  Major depressive disorder, recurrent episode, moderate (HCC) Insomnia Seen by psychiatry. They started Prozac. Remeron at bedtime to help with mood, sleep and appetite stimulation. - melatonin PRN  Hypothyroidism TSH slightly high.  Restarted levothyroxine at a slightly higher dose.  Underweight BMI 17.83 History of eating disorder as well. Now with J tube, attempting to optimize nutrition status, but limited by recurrent abdominal pain and N/V.  Subjective / Interval history Pt seen this AM, doing well overall.  She reports some abdominal fullness but no diarrhea.  No nausea or vomiting.  Night nurses did work with her on tube feed pump last night, pt reports it went okay.  Tolerating diet well.  She is worried how her GI motility will be once erythromycin is off.     Physical Exam: Vitals:   07/02/22 1653 07/02/22 2139 07/03/22 0436 07/03/22 0741  BP: (!) 159/90 (!) 142/85 (!) 141/79 119/73  Pulse: 97 94 94 95  Resp: 16 16 16   $ Temp: (!) 97.5 F (36.4 C) 98 F (36.7 C) 97.9 F (36.6 C) 97.8 F (36.6 C)  TempSrc: Oral  Oral Oral   SpO2: 100% 100% 100% 100%  Weight:      Height:       General exam: awake, alert, no acute distress, frail but has gained weight HEENT: moist mucus membranes, hearing grossly normal  Respiratory system: CTAB no wheezes or rhonchi, on room air, normal respiratory effort. Cardiovascular system: RRR, no pedal edema.   Gastrointestinal system: soft, J tube present.  Soft, non-distended Central nervous system: A&O x4. no gross focal neurologic deficits, normal speech Extremities: moves all, no edema, normal tone Skin: dry, intact, normal temperature Psychiatry: normal mood, congruent affect, judgement and insight appear normal     Data Reviewed:    Latest Ref Rng & Units 07/03/2022    5:07 AM 07/02/2022    4:46 AM 07/01/2022    3:48 AM  BMP  Glucose 70 - 99 mg/dL 195  86  112   BUN 6 - 20 mg/dL 77  74  72   Creatinine 0.44 - 1.00 mg/dL 1.46  1.13  1.16   Sodium 135 - 145 mmol/L 139  139  140   Potassium 3.5 - 5.1 mmol/L 5.5  6.1  5.2   Chloride 98 - 111 mmol/L 106  107  106   CO2 22 - 32 mmol/L 25  27  27   $ Calcium 8.9 - 10.3 mg/dL 8.4  8.1  8.2     Disposition: Status is: Inpatient Remains inpatient appropriate because: continuing to monitor for stability of electrolytes and renal function.   Needs tube feed training in preparation for d/c.    Planned Discharge Destination: home after calorie count is completed and patient trained adequately on managing tube feeds herself  Time spent: 36 minutes  Author: Ezekiel Slocumb, DO 07/03/2022 12:43 PM  For on call review [email or web address].

## 2022-07-04 NOTE — Progress Notes (Signed)
Mobility Specialist - Progress Note    07/04/22 1347  Mobility  Activity Ambulated independently in hallway  Level of Assistance Independent  Assistive Device None  Distance Ambulated (ft) 800 ft  Activity Response Tolerated well  Mobility Referral Yes  $Mobility charge 1 Mobility     [employer] Mobility Specialist 07/04/22, 1:48 PM

## 2022-07-04 NOTE — Progress Notes (Signed)
Mobility Specialist - Progress Note     07/04/22 1000  Mobility  Activity Ambulated independently in hallway;Stood at bedside;Dangled on edge of bed  Level of Assistance Independent  Assistive Device None  Distance Ambulated (ft) 640 ft  Activity Response Tolerated well  Mobility Referral Yes  $Mobility charge 1 Mobility   Pt resting EOB upon entry. Pt STS and ambulates to hallway around NS for 4 laps Indep. Pt returned to bed and left with needs in reach and bed alarm activated.   Loma Sender Mobility Specialist 07/04/22, 10:12 AM

## 2022-07-05 DIAGNOSIS — E10649 Type 1 diabetes mellitus with hypoglycemia without coma: Secondary | ICD-10-CM | POA: Diagnosis not present

## 2022-07-05 DIAGNOSIS — I5042 Chronic combined systolic (congestive) and diastolic (congestive) heart failure: Secondary | ICD-10-CM | POA: Diagnosis not present

## 2022-07-05 DIAGNOSIS — K3184 Gastroparesis: Secondary | ICD-10-CM | POA: Diagnosis not present

## 2022-07-05 DIAGNOSIS — Z934 Other artificial openings of gastrointestinal tract status: Secondary | ICD-10-CM | POA: Diagnosis not present

## 2022-07-05 DIAGNOSIS — D638 Anemia in other chronic diseases classified elsewhere: Secondary | ICD-10-CM | POA: Diagnosis not present

## 2022-07-05 DIAGNOSIS — E43 Unspecified severe protein-calorie malnutrition: Secondary | ICD-10-CM | POA: Diagnosis not present

## 2022-07-05 LAB — BASIC METABOLIC PANEL
Anion gap: 4 — ABNORMAL LOW (ref 5–15)
BUN: 75 mg/dL — ABNORMAL HIGH (ref 6–20)
CO2: 28 mmol/L (ref 22–32)
Calcium: 8.4 mg/dL — ABNORMAL LOW (ref 8.9–10.3)
Chloride: 110 mmol/L (ref 98–111)
Creatinine, Ser: 1.29 mg/dL — ABNORMAL HIGH (ref 0.44–1.00)
GFR, Estimated: 57 mL/min — ABNORMAL LOW (ref >60)
Glucose, Bld: 150 mg/dL — ABNORMAL HIGH (ref 70–99)
Potassium: 5.4 mmol/L — ABNORMAL HIGH (ref 3.5–5.1)
Sodium: 142 mmol/L (ref 135–145)

## 2022-07-05 LAB — GLUCOSE, CAPILLARY
Glucose-Capillary: 110 mg/dL — ABNORMAL HIGH (ref 70–99)
Glucose-Capillary: 113 mg/dL — ABNORMAL HIGH (ref 70–99)
Glucose-Capillary: 145 mg/dL — ABNORMAL HIGH (ref 70–99)
Glucose-Capillary: 189 mg/dL — ABNORMAL HIGH (ref 70–99)
Glucose-Capillary: 82 mg/dL (ref 70–99)
Glucose-Capillary: 201 mg/dL — ABNORMAL HIGH (ref 70–99)
Glucose-Capillary: 177 mg/dL — ABNORMAL HIGH (ref 70–99)

## 2022-07-05 LAB — HEMOGLOBIN AND HEMATOCRIT, BLOOD
HCT: 22.2 % — ABNORMAL LOW (ref 36.0–46.0)
Hemoglobin: 7.2 g/dL — ABNORMAL LOW (ref 12.0–15.0)

## 2022-07-05 MED ORDER — SODIUM ZIRCONIUM CYCLOSILICATE 10 G PO PACK
10.0000 g | PACK | Freq: Once | ORAL | Status: AC
  Administered 2022-07-05: 10 g via ORAL
  Filled 2022-07-05: qty 1

## 2022-07-05 MED ORDER — SIMETHICONE 80 MG PO CHEW
80.0000 mg | CHEWABLE_TABLET | Freq: Four times a day (QID) | ORAL | Status: DC | PRN
  Administered 2022-07-05 – 2022-07-06 (×2): 80 mg via ORAL
  Filled 2022-07-05 (×3): qty 1

## 2022-07-05 MED ORDER — GLUCERNA SHAKE PO LIQD
237.0000 mL | Freq: Every day | ORAL | Status: DC
  Administered 2022-07-05: 237 mL via ORAL

## 2022-07-05 MED ORDER — OSMOLITE 1.5 CAL PO LIQD
660.0000 mL | ORAL | Status: DC
  Administered 2022-07-05: 660 mL

## 2022-07-05 NOTE — Progress Notes (Signed)
. Progress Note   Patient: Ashley Camacho [id number] DOB: March 28, 1991 DOA: 06/09/2022     26 DOS: the patient was seen and examined on 07/05/2022   Brief hospital course: 32 year old female brought to the emergency room for weakness, vomiting and lethargy.  She was found to be in diabetic ketoacidosis.  She also has a history of depression and stopped taking her medication secondary to cost.  Patient has a history of adrenal insufficiency and diabetes.   1/18.  Patient interested in having a place to stay to help take care of her.  Case discussed with psychiatry to see the patient in consultation.  Tapered off insulin drip to long-acting insulin. 1/19.  Patient agreeable to Dobbhoff feeding tube.  IR placed 1/20.  Dobbhoff tube has not advanced through the stomach on 3 x-rays.  General surgery consulted for jejunostomy tube placement.  Dobbhoff tube removed. 1/21.  Jejunostomy tube placed by Dr. Dahlia Byes. 1/22.  Fluid bolus for low BP;s & stress dose steroids. Transfused 1 units for Hbg 7.4.   1/23.  Patient feeling a little bit better.  Steroids tapered back to home Corteg.  Had some intermittent vomiting yesterday.  Up to 30 mL an hour with tube feeds by J-tube.   1/24: pt reports feeling miserable with recurrent N/V and abdominal discomfort.  Tube feeds just increased 40 >> 50 cc/hr.  Reduced TF rate to see if she feels better. 1/25: stopped Reglan, increased erythromycin, reduced TF rate to 30 cc/hr.  Given IV Lasix for dependent edema. 1/26: pt eating a sandwich, N/V improved today.  .  Given IV Lasix again. 1/28: no N/V past couple days, tolerating meals much better, requesting Glucerna shakes. Agreeable to resume mirtazapine for sleep, anxiety and appetite stimulation. 1/29: transition to nocturnal feeds tonight. She continues to do well.  Remeron helped w sleep. 1/30: hypoglycemic this AM (44) after being given insulin with glucose only 94 early AM.  Repeat 84, with tube feeds still  running. Transition to nocturnal feeds did not occur last night. 1/31: Nocturnal feeds started, she tolerated at 55 cc/hr (goal 75 cc/hr).  CBG very low at 19 this AM, recovered.  Insulin orders for tube feed coverage were adjusted. 2/2-2/5: adjusting feeds to account for blood sugar and electrolyte modification. Monitoring electrolytes. Patient near stable for dc. TOC engaged for placement to group home but patient states she can go home with [facility] if not able to find a placement. Staples removed 2/5. 2/6-2/7: continue to adjust feeding and insulin regimen to better control blood sugars and electrolytes.   2/8 - patient feels well. Has had >24 hours of relatively stable glucose control. Endorses mild irritation at J tube insertion site. No nausea.  2/9--10 - calorie count underway.  Pt has not been trained to manage tube feeds herself.  Requested nurses pass along to night team to do so. 2/11 >> tube feed training underway.  RD to review calorie count and nutrition needs.   Assessment and Plan:  Hypotension 2/2 adrenal insufficiency- resolved BP is now at goal.  She is on Cortef.  DKA-resolved Type 1 diabetes mellitus-  Sugars have been labile since admission with fluctuating nutrition intake. With chronic steroid use and introduction of enteral feeds via J tube, blood sugar control has been an ongoing challenge.  - Insulin regimen being adjusted in conjunction with RD, diabetes coordinator, and pharmacy discussions.  - Patient would benefit from dexcom device, will need follow up with endocrinology first. - hypoglycemia protocol -  monitor CBGs q4 hours - discharge is delayed due to safety concerns about home disease management. She has no insurance or PCP so unable to get [facility] at this time. Medicaid application is pending. She would not be able to afford the insulin or the nutrition supplement if discharged. TOC is engaged to evaluate for charity care and PCP assignment.  - consult to RD to do 48  hour calorie count to see what percentage of nutritional needs patient is capable of consuming via PO and if it would be possible to stop tube feeds.   Gastroparesis- j-tube in place and healing well.  --continue erythromycin to 100 mg TID, can go up to 250 mg if needed. - GI recommended limit 2 weeks of eythromycin.   Reduced erythromycin TID >> BID on 2/9 >> once daily with lunch (2/11).   Stop erythromycin (2/12) Monitor closely for recurrent motility issues or N/V.   She's been doing quite well tolerating PO intake since on erythromycin.   Anemia of chronic disease- Status post transfusion of 2 units pRBC's  (on admission, and on 1/22) earlier this admission.  Ferritin elevated at 1511 (acute phase?).  Iron studies do not show deficiency Hemoglobin 8.8>>>8.3>>>7.8>>7.5>>7.3  >>7.7 >> 7.2>>7.2 No overt bleeding reported. - Check 123456 and folic acid levels - CBC am  AKI on CKDII -resolved- Creatinine 2.58 on presentation and down to 1.1 with GFR greater than 60.  2/10 - mild AKI, Cr 1.49 after given Lasix 2/9 for hyperkalemia. 2/11 - Cr improved 1.33 with no fluids, off diuretics 2/12 Cr 1.28 - repeat BMP in AM, gentle fluids if not improved - continue to monitor  Hhyperkalemia- Resolved.  Likely secondary to the nutrition feeds. K+ 5.2 >> 6.1 (treated) >> 5.5 (treated) >> 5.1 - Changed Glucerna shakes to Nepro  - BMP am  Acute metabolic encephalopathy-resolved as of 06/17/2022 Resolved. Secondary to diabetic ketoacidosis.    Protein-calorie malnutrition, severe Jejunostomy tube placed on 1/21 by Dr. Dahlia Byes.  Started on tube feeds. Staples removed 2/5 --Appreciate Dietitian's recs 1/24>>29: started and titrated up tube feeds.  Started tolerating meals again, no N/V.  1/30: transitioning to nocturnal feeds, adjusting insulin accordingly 2/1- 2/11: continues on nocturnal feeds and tolerating well, rate gradually decreases as PO intake has improved.  - continue Remeron for appetite  stimulant, mood disorder/s and sleep. - follow up dietitian recommendations - Nursing staff to train / educate patient with tube feed management  Hypophosphatemia  Refeeding Syndrome- Resolved, post repletion and remains stable. 3.7 on 2/6  Electrolyte abnormalities- due to poor PO intake, refeeding syndrome, feeding supplement  - monitor K+, Mg++, Na+, phos - albuterol and lokelma PRN for hyperkalemia - replete PRN  Adrenal insufficiency ([place]) Initially required stress dose steroids. Transitioned to PO Cortef on 1/24. - continue hydrocortisone 63m am 29mpm  Thrombocytopenia- resolved  Chronic diastolic CHF Last echo [id number]- EF 50-55% and normal diastolic parameters, apparently prior EF as low as 30%. --Monitor volume status with strict I/O's and daily weights --Given IV Lasix 20 mg 1/22, 1/25, 1/26, 1/28, 2/9 (for hyperkalemia)  --Further diuresis as needed, assess daily  Major depressive disorder, recurrent episode, moderate (HCC) Insomnia Seen by psychiatry. They started Prozac. Remeron at bedtime to help with mood, sleep and appetite stimulation. - melatonin PRN  Hypothyroidism TSH slightly high.  Restarted levothyroxine at a slightly higher dose.  Underweight BMI 17.83 History of eating disorder as well. Now with J tube, attempting to optimize nutrition status, but limited by  recurrent abdominal pain and N/V.   Subjective / Interval history Pt seen this AM, doing well.  Still having some cramping abdominal pain, possibly gas pains.  No N/V. Some abdominal fullness but this seem stable, not worsening.  Physical Exam: Vitals:   07/05/22 0305 07/05/22 0439 07/05/22 0734 07/05/22 1145  BP:  (!) 134/90 (!) 145/88 138/75  Pulse:  99 97 99  Resp:  18 19 19  $ Temp:  98.5 F (36.9 C) 98.1 F (36.7 C) 98.2 F (36.8 C)  TempSrc:  Oral    SpO2:  100% 100% 100%  Weight: 46 kg     Height:       General exam: awake, alert, no acute distress, frail but has gained  weight HEENT: moist mucus membranes, hearing grossly normal  Respiratory system: CTAB no wheezes or rhonchi, on room air, normal respiratory effort. Cardiovascular system: RRR, no pedal edema.   Gastrointestinal system: soft, J tube present with tube feeds running.  Soft, non-distended Central nervous system: A&O x4. no gross focal neurologic deficits, normal speech Extremities: moves all, no edema, normal tone Skin: dry, intact, normal temperature Psychiatry: normal mood, congruent affect, judgement and insight appear normal     Data Reviewed:    Latest Ref Rng & Units 07/05/2022    5:51 AM 07/04/2022    4:40 AM 07/03/2022    5:07 AM  BMP  Glucose 70 - 99 mg/dL 150  160  195   BUN 6 - 20 mg/dL 75  76  77   Creatinine 0.44 - 1.00 mg/dL 1.29  1.33  1.46   Sodium 135 - 145 mmol/L 142  142  139   Potassium 3.5 - 5.1 mmol/L 5.4  5.1  5.5   Chloride 98 - 111 mmol/L 110  110  106   CO2 22 - 32 mmol/L 28  26  25   $ Calcium 8.9 - 10.3 mg/dL 8.4  8.3  8.4     Disposition: Status is: Inpatient Remains inpatient appropriate because: recurrent electrolyte abnormalities (hyperkalemia) Continuing to monitor for stability of electrolytes and renal function.   Needs tube feed training in preparation for d/c.    Planned Discharge Destination: home after calorie count is completed and patient trained adequately on managing tube feeds herself  Time spent: 40 minutes  Author: Ezekiel Slocumb, DO 07/05/2022 12:10 PM  For on call review [email or web address].

## 2022-07-05 NOTE — Progress Notes (Addendum)
Calorie Count Note  48 hour calorie count ordered.  Diet: soft Supplements: Glucerna Shake po TID, each supplement provides 220 kcal and 10 grams of protein; nocturnal feeds via j-tube: Osmolite 1.5 @ 65 ml/hr x 12 hours  Case discussed with RN and MD. Pt has exceeded nutritional needs via calorie count. Plan to d/c j-tube feedings. Also paged DM coordinator to assist with insulin adjustments.   MD would like to continue TF for now in case pt does not do well when titrated off erythromycin.   2/8-2/9 Breakfast: 995 kcals, 39 grams protein Lunch: 805 kcals, 31 grams protein Dinner: 739 kcals, 33 grams protein   Total intake: 2539 kcal (100% of minimum estimated needs)  103 grams protein (100% of minimum estimated needs)  2/10 Breakfast: 871 kcals, 34 grams protein Lunch: 558 kcals, 25 grams protein Dinner: 665 kcals, 26 grams protein  Total intake: 2091 kcal (100% of minimum estimated needs)  85 grams protein (100% of minimum estimated needs)  2/11 Breakfast: 656 kcals, 20 grams protein Lunch: 742 kcals, 35 grams protein Dinner: 530 kcals, 27 grams protein Snack: 200 kcals, 9 grams protein  Total intake: 2128 kcal (100% of minimum estimated needs)  91 grams protein (100% of minimum estimated needs)  Average Total intake: 2253 kcal (100% of minimum estimated needs)  93 grams protein (100% of minimum estimated needs)  Nutrition Dx: Severe Malnutrition related to social / environmental circumstances as evidenced by moderate muscle depletion, severe muscle depletion, moderate fat depletion, severe fat depletion, percent weight loss; ongoing   Goal: Patient will meet greater than or equal to 90% of their needs; progressing    Intervention:    -D/c calorie count -Continue Glucerna Shake po TID, each supplement provides 220 kcal and 10 grams of protein  -D/c Magic Cup -TF via j-tube:   Osmolite 1.5 @ 55 ml/hr over 12 hours  30 ml free water flush every 4 hours  Tube  feeding regimen provides 990 kcal (60% of needs), 40 grams of protein, and 503 ml of H2O. Total free water: 683 ml daily    Loistine Chance, RD, LDN, [place] Registered Dietitian II Certified Diabetes Care and Education Specialist Please refer to [facility] for RD and/or RD on-call/weekend/after hours pager

## 2022-07-05 NOTE — Inpatient Diabetes Management (Signed)
Inpatient Diabetes Program Recommendations  AACE/ADA: New Consensus Statement on Inpatient Glycemic Control (2015)  Target Ranges:  Prepandial:   less than 140 mg/dL      Peak postprandial:   less than 180 mg/dL (1-2 hours)      Critically ill patients:  140 - 180 mg/dL    Latest Reference Range & Units 07/04/22 08:10 07/04/22 10:03 07/04/22 12:15 07/04/22 14:04 07/04/22 16:19 07/04/22 18:04 07/04/22 19:56 07/04/22 22:33  Glucose-Capillary 70 - 99 mg/dL 151 (H) 137 (H) 197 (H) 192 (H) 117 (H) 222 (H) 270 (H) 191 (H)    Latest Reference Range & Units 07/04/22 23:31 07/05/22 02:13 07/05/22 04:38 07/05/22 06:00 07/05/22 07:31 07/05/22 11:42  Glucose-Capillary 70 - 99 mg/dL 196 (H) 110 (H) 113 (H) 145 (H) 189 (H)  2 units Novolog  5 units Levemir @0924$  82  (H): Data is abnormally high   History: Type 1 diabetes  Home DM Meds: Novolog 2 unit tid with meals     Lantus 4 units bid  Current Orders: Levemir 5 units BID     Novolog 0-6 units Q4 hours     Novolog 1 unit TID with meals     Cortef 20 mg AM/ 10 mg PM     MD- Note that Nocturnal tube feed rate will be reduced tonight to 55cc/hr (was running at 65cc/hr)  Stopping the Erythromycin today  CBG down to 82 at 11am today  Please consider reduction of the Levemir to 4 units BID    --Will follow patient during hospitalization--  Wyn Quaker RN, MSN, [place] Diabetes Coordinator Inpatient Glycemic Control Team Team Pager: [phone] (8a-5p)

## 2022-07-06 DIAGNOSIS — D638 Anemia in other chronic diseases classified elsewhere: Secondary | ICD-10-CM | POA: Diagnosis not present

## 2022-07-06 DIAGNOSIS — K3184 Gastroparesis: Secondary | ICD-10-CM | POA: Diagnosis not present

## 2022-07-06 DIAGNOSIS — E875 Hyperkalemia: Secondary | ICD-10-CM

## 2022-07-06 DIAGNOSIS — E43 Unspecified severe protein-calorie malnutrition: Secondary | ICD-10-CM | POA: Diagnosis not present

## 2022-07-06 LAB — GLUCOSE, CAPILLARY
Glucose-Capillary: 142 mg/dL — ABNORMAL HIGH (ref 70–99)
Glucose-Capillary: 147 mg/dL — ABNORMAL HIGH (ref 70–99)
Glucose-Capillary: 156 mg/dL — ABNORMAL HIGH (ref 70–99)
Glucose-Capillary: 180 mg/dL — ABNORMAL HIGH (ref 70–99)
Glucose-Capillary: 192 mg/dL — ABNORMAL HIGH (ref 70–99)
Glucose-Capillary: 265 mg/dL — ABNORMAL HIGH (ref 70–99)
Glucose-Capillary: 286 mg/dL — ABNORMAL HIGH (ref 70–99)
Glucose-Capillary: 322 mg/dL — ABNORMAL HIGH (ref 70–99)

## 2022-07-06 LAB — BASIC METABOLIC PANEL
Anion gap: 7 (ref 5–15)
BUN: 79 mg/dL — ABNORMAL HIGH (ref 6–20)
CO2: 26 mmol/L (ref 22–32)
Calcium: 8.4 mg/dL — ABNORMAL LOW (ref 8.9–10.3)
Chloride: 108 mmol/L (ref 98–111)
Creatinine, Ser: 1.37 mg/dL — ABNORMAL HIGH (ref 0.44–1.00)
GFR, Estimated: 53 mL/min — ABNORMAL LOW (ref >60)
Glucose, Bld: 157 mg/dL — ABNORMAL HIGH (ref 70–99)
Potassium: 5.4 mmol/L — ABNORMAL HIGH (ref 3.5–5.1)
Sodium: 141 mmol/L (ref 135–145)

## 2022-07-06 LAB — PREPARE RBC (CROSSMATCH)

## 2022-07-06 LAB — CBC
HCT: 21.1 % — ABNORMAL LOW (ref 36.0–46.0)
Hemoglobin: 6.9 g/dL — ABNORMAL LOW (ref 12.0–15.0)
MCH: 31.7 pg (ref 26.0–34.0)
MCHC: 32.7 g/dL (ref 30.0–36.0)
MCV: 96.8 fL (ref 80.0–100.0)
Platelets: 214 10*3/uL (ref 150–400)
RBC: 2.18 MIL/uL — ABNORMAL LOW (ref 3.87–5.11)
RDW: 14.4 % (ref 11.5–15.5)
WBC: 6.8 10*3/uL (ref 4.0–10.5)
nRBC: 0 % (ref 0.0–0.2)

## 2022-07-06 LAB — HEMOGLOBIN AND HEMATOCRIT, BLOOD
HCT: 28 % — ABNORMAL LOW (ref 36.0–46.0)
Hemoglobin: 9.1 g/dL — ABNORMAL LOW (ref 12.0–15.0)

## 2022-07-06 MED ORDER — SODIUM CHLORIDE 0.9% IV SOLUTION: Freq: Once | INTRAVENOUS | Status: AC

## 2022-07-06 MED ORDER — SODIUM ZIRCONIUM CYCLOSILICATE 10 G PO PACK
10.0000 g | PACK | Freq: Once | ORAL | Status: AC
  Administered 2022-07-06: 10 g via ORAL
  Filled 2022-07-06: qty 1

## 2022-07-06 NOTE — Progress Notes (Signed)
Mobility Specialist - Progress Note     07/06/22 0835  Mobility  Activity Ambulated independently in hallway  Level of Assistance Independent  Assistive Device None  Distance Ambulated (ft) 640 ft  Activity Response Tolerated well  Mobility Referral Yes  $Mobility charge 1 Mobility    Loma Sender Mobility Specialist 07/06/22, 8:38 AM

## 2022-07-06 NOTE — Assessment & Plan Note (Addendum)
K 5.4.  Has has recurrent potassium elevation requiring Lokelma.  Will give another 10 g today. Monitor BMP Will need PCP appt very close to discharge for repeat labs

## 2022-07-06 NOTE — Assessment & Plan Note (Signed)
See AKI.  At baseline now  Lab Results  Component Value Date   CREATININE 1.18 (H) 07/07/2022   CREATININE 1.37 (H) 07/06/2022   CREATININE 1.29 (H) 07/05/2022

## 2022-07-06 NOTE — Assessment & Plan Note (Signed)
Due to DKA and refractory N/V.  Now resolved

## 2022-07-06 NOTE — Progress Notes (Signed)
Mobility Specialist - Progress Note     07/06/22 1132  Mobility  Activity Ambulated independently in hallway  Level of [place] Other (Comment) ([place]v [place])  Distance Ambulated (ft) 640 ft  Activity Response Tolerated well  Mobility Referral Yes  $Mobility charge 1 Mobility    [employer] Mobility Specialist 07/06/22, 11:41 AM

## 2022-07-06 NOTE — Progress Notes (Signed)
Progress Note   Patient: Ashley Camacho [id number] DOB: [birth year] DOA: 06/09/2022     27 DOS: the patient was seen and examined on 07/06/2022   Brief hospital course: 32 year old female brought to the emergency room for weakness, vomiting and lethargy.  She was found to be in diabetic ketoacidosis.  She also has a history of depression and stopped taking her medication secondary to cost.  Patient has a history of adrenal insufficiency and type 1 diabetes.  Prolonged and complicated hospital admission with detailed course below.  Currently being trained to self-administer nocturnal tube feeds, and has persistent / recurrent electrolyte abnormalities and anemia needing transfusion.  1/18.  Tapered off insulin drip.  No tolerance for PO intake due to refractory N/V related to gastroparesis 1/19.  Patient agreeable to Dobbhoff feeding tube, it was placed by IR, but did not advance past stomach after 3 days.  Dobhoff removed 1/21.  Jejunostomy tube placed by Dr. Dahlia Byes. 1/22.  Fluid bolus for low BP;s & stress dose steroids. Transfused 1 units for Hbg 7.4.   1/23.  Patient feeling a little bit better.  Steroids tapered back to home Cortef.  Had some intermittent vomiting yesterday.  Up to 30 mL/hr tube feeds by J-tube.  1/24 >> 1/31 recurrent N/V and abdominal discomfort with increasing tube feed rate.   1/25: stopped Reglan, increased erythromycin,  1/26: pt eating a sandwich, N/V improved today.  .  1/28: no N/V past couple days, tolerating meals much better, requesting Glucerna shakes.  1/29: transition to nocturnal feeds tonight. She continues to do well.   1/30: hypoglycemic this AM (44) - insulin was given overnight despite tube feeds not started 1/31: Nocturnal feeds started, she tolerated at 55 cc/hr (goal 75 cc/hr).    2/7 >> 2/13: calorie count done. Eating adequate calories, but pt eating much better here than she is able to at home.   Tapered and stopped erythromycin (per GI max  is 2 weeks). Monitoring closely for recurrent motility issues. Patient wants to continue nocturnal feeds. Night nurses should be training to her manage tube feeds. Needed 1 unit RBC's today 2/13. Recurrent hyperkalemia remains an issue. Sugars typically are VERY labile, but stable for past few days.  Assessment and Plan: * Hypotension Improved with stress dose steroids, blood transfusion and fluids.  Tapered off stress dose steroids.  Resumed on home dose Cortef. Monitor BP.  DKA (diabetic ketoacidosis) ([place]) Type 1 diabetes with DKA on admission.  Sugars have been labile since admission, more steady since tolerating PO intake better.   Sugars are VERY labile, she is extremely insulin sensitive. --Continue 5 units Levemir qHS --Novolog 1 unit TID WC + very sensitive SSI --CBG's Q4H --Adjust for goal 140-180 --Hypoglycemia protocol --Appreciate diabetes coordinator input --Referral on d/c to [facility], Dr. Gabriel Carina   Gastroparesis Diabetic gastroparesis.  Continue trial of Reglan and erythromycin (limit erythromycin to 2 weeks). GI recommendations from 1/18-19 reviewed.   She has persistent N/V and abdominal pain on tube feeds. 1/26: since stopping Reglan yesterday and increasing erythromycin, reducing tube feed rate, she feels much better. Actually feels like eating for the first time. 1/27>>31: continues to do well, tolerating meals, no N/V. --Discussed with GI (1/25), appreciate input --Stopped Reglan (hx of this not working for her & could be contributing to N/V)  --Treated with erythromycin to 100 mg TID with excellent response.  No N/V recurrence for some time.   --Per GI, erythromycin duration max 2 weeks.  It has  been tapered and discontinued --Monitor closely for recurrent symptoms --May ultimately benefit from tertiary center referral for consideration of G-POEM procedure --Advised patient keep food diary & learn which foods are best tolerated, and look at [facility] patient info PDF on gastroparesis diet --Appreciate collaboration with Dietitian  Acute kidney injury superimposed on chronic kidney disease ([place]) Acute kidney injury on CKD likely stage II.  Creatinine 2.58 on presentation and briefly normalized.  Cr today is 1.37, up slightly from 1.29. Mild recurrent AKI after Lasix given 2/9 Monitor BMP  Acute metabolic encephalopathy-resolved as of 06/17/2022 Resolved. Secondary to diabetic ketoacidosis.    Protein-calorie malnutrition, severe Jejunostomy tube placed on 1/21 by Dr. Dahlia Byes.  Started on tube feeds. --Appreciate Dietitian's recs 1/24>>29: started and titrated up tube feeds.  Started tolerating meals again, no N/V.  1/30: transitioning to nocturnal feeds, adjusting insulin accordingly --Added Remeron for appetite stimulant, mood disorder/s and sleep. --On nocturnal feeds tonight. --Requested nurses to teach patient how to manage her tube feeds and pump for d/c preparation  Hypophosphatemia Refeeding Syndrome Replaced on 1/22, 1/26, 1/27 1/28 given IV phos-Nak 1/29 phos 4.1 1/30 phos 3.3 Has stabilized, monitor intermittently   Hypomagnesemia Replaced & resolved. Continue to monitor.  Adrenal insufficiency ([place]) Initially required stress dose steroids.   Transitioned to PO Cortef on 1/24. On Cortef 20 mg daily at 0800, 10 mg at 1500 Recommend endocrinology follow up outpatient. Pt requests referral to [facility] due to transportation issues getting to [facility].  Thrombocytopenia (HCC) Appears chronic.  Monitor CBC. Platelets 129k.  Chronic combined systolic and diastolic CHF (congestive heart failure) ([place]) Last echo 02/27/2021 -- EF 50-55% and normal diastolic parameters, apparently prior EF as low as 30%. --Monitor volume status with strict I/O's and daily weights --Given IV Lasix 20 mg intermittently, last on 2/9  --Further diuresis as needed, assess daily Pt reports increasing edema, but Cr is up after  last Lasix given.  Monitor for now.  Pseudohyponatremia Sodium initially low secondary to diabetic ketoacidosis.  Last sodium in the normal range.  Major depressive disorder, recurrent episode, moderate (HCC) Seen by psychiatry. They started Prozac. Added Remeron at bedtime to help with mood, sleep and appetite stimulation.  Hypothyroidism TSH slightly high.  Restarted levothyroxine at a slightly higher dose.  Depression Appreciate psychiatric consultation.  Prozac started. Resumed on Remeron for sleep and appetite as well.    Stage 3b chronic kidney disease ([place]) See AKI  Severe malnutrition ([place]) .  Jejunostomy tube present ([place]) .  Hypovolemic shock (HCC) Due to DKA and refractory N/V  Insomnia She used to be on Remeron for depression/anxiety and appetite stimulant. Appears not on this PTA, unclear when she last was taking it, she does not recall. --Start Remeron 15 mg at bedtime --As needed melatonin  Underweight BMI 17.83 History of eating disorder as well. Now with J tube on nocturnal feeds, attempting to optimize nutrition status.   With N/V improved, she is eating adequately. Pt may have some food security issues as home, reports she is able to eat much better here than at home.  Hyperkalemia K 5.4.  Has has recurrent potassium elevation requiring Lokelma.  Will give another 10 g today. Monitor BMP Will need PCP appt very close to discharge for repeat labs  Anemia of chronic disease Status post transfusion of 2 units pRBC's  (on admission, and on 1/22) earlier this admission.   Hbg has slowly trended down since, today 6.9. No evidence of bleeding. --Transfused 1  unit pRBC's this AM --Monitor CBC, transfuse PRN if < 7 --Recommend Hematology to follow after discharge for outpatient transfusions if needed.   Type 1 diabetes mellitus with hypoglycemia without coma ([place]) See DKA for mgmt        Subjective: Pt seen after lunch today, doing well. No  N/V.  She reports slightly increasing edema. Getting blood transfusion today.  We discussed whether or not to continue nighttime tube feeds since she is eating enough calories.  She states that she eats [facility] better here than at home.  Often misses meals and available foods are not very healthy.  She is also concerned about recurrent gastroparesis symptoms and future need for tube feeds.   Physical Exam: Vitals:   07/06/22 1015 07/06/22 1335 07/06/22 1557 07/06/22 1853  BP: 126/76 (!) 141/80 (!) 149/82 (!) 151/86  Pulse: (!) 101 99 88 93  Resp: 16 16 18 18  $ Temp: 97.7 F (36.5 C) 97.7 F (36.5 C) 97.9 F (36.6 C) 98 F (36.7 C)  TempSrc: Oral Oral    SpO2: 100% 100% 100% 100%  Weight:      Height:       General exam: awake, alert, no acute distress, chronically ill appearing, has gained weight HEENT: moist mucus membranes, hearing grossly normal  Respiratory system: CTAB, no wheezes, rales or rhonchi, normal respiratory effort. Cardiovascular system: normal S1/S2, RRR, no pedal edema.   Gastrointestinal system: soft, NT, ND, J tube present Central nervous system: A&O x4. no gross focal neurologic deficits, normal speech Extremities: moves all, no edema, normal tone Skin: dry, intact, normal temperature, normal color, No rashes, lesions or ulcers Psychiatry: normal mood, congruent affect, judgement and insight appear normal  Data Reviewed:  Notable labs -- K 5.4 again,  glucose 157, BUN 79, Cr 1.37, Ca 8.4 Hbg 6.9 --   Family Communication: None. Pt able to update and declines need to update.  Disposition: Status is: Inpatient Remains inpatient appropriate because: Persisent electrolyte issues, anemia needing transfusion, training for pt to manage tube feeds is underway  Planned Discharge Destination: Home    Time spent: 42 minutes  Author: Ezekiel Slocumb, DO 07/06/2022 8:01 PM  For on call review [email or web address].

## 2022-07-06 NOTE — Progress Notes (Signed)
       CROSS COVER NOTE  NAME: JUDYANN CASASOLA MRN: [id number] DOB : 19-May-1991 ATTENDING PHYSICIAN: Ezekiel Slocumb, DO    Date of Service   07/06/2022   HPI/Events of Note   HGB has continued to trend down, 6.9 this morning.  Interventions   Assessment/Plan:  Transfuse 1U PRBC      To reach the provider On-Call:   7AM- 7PM see care teams to locate the attending and reach out to them via [email or web address] Password: [email or web address] 7PM-7AM contact night-coverage If you still have difficulty reaching the appropriate provider, please page the [facility] (Director on Call) for [facility] on amion for assistance  This document was prepared using Systems analyst and may include unintentional dictation errors.  [name] DNP, MBA, FNP-BC, PMHNP-BC Nurse Practitioner [facility] Pager [phone]

## 2022-07-06 NOTE — TOC Progression Note (Signed)
Transition of Care [facility]) - Progression Note    Patient Details  Name: Ashley Camacho MRN: [id number] Date of Birth: [birth year]  Transition of Care [facility]) CM/SW Contact  Gerilyn Pilgrim, LCSW Phone Number: 07/06/2022, 12:20 PM  Clinical Narrative:   SW spoke with Elberta Fortis at [facility] group home. He is going to call me back once he talks to his mother regarding patients transport at discharge.     Expected Discharge Plan:  (TBD) Barriers to Discharge: Continued Medical Work up  Expected Discharge Plan and Services In-house Referral: Development worker, community, Nutrition Discharge Planning Services: CM Consult   Living arrangements for the past 2 months: Single Family Home                                       Social Determinants of Health (SDOH) Interventions SDOH Screenings   Food Insecurity: No Food Insecurity (06/10/2022)  Housing: Low Risk  (06/10/2022)  Transportation Needs: No Transportation Needs (06/10/2022)  Utilities: Not At Risk (06/10/2022)  Depression (PHQ2-9): Low Risk  (11/11/2020)  Tobacco Use: Low Risk  (06/17/2022)    Readmission Risk Interventions    11/03/2021   10:37 AM 06/16/2021   12:30 PM 06/03/2021    4:26 PM  Readmission Risk Prevention Plan  Transportation Screening Complete Complete Complete  Medication Review Press photographer) Complete Complete Complete  PCP or Specialist appointment within 3-5 days of discharge Complete Complete Complete  HRI or [place] Not Complete  Not Complete  HRI or Home Care Consult Pt Refusal Comments NA  NA  SW Recovery Care/Counseling Consult Not Complete Complete Complete  SW Consult Not Complete Comments NA    Palliative Care Screening Not Applicable Not Applicable Not [place] Not Applicable Not Applicable Not Applicable

## 2022-07-07 DIAGNOSIS — K3184 Gastroparesis: Secondary | ICD-10-CM | POA: Diagnosis not present

## 2022-07-07 DIAGNOSIS — I9589 Other hypotension: Secondary | ICD-10-CM

## 2022-07-07 DIAGNOSIS — G9341 Metabolic encephalopathy: Secondary | ICD-10-CM | POA: Diagnosis not present

## 2022-07-07 DIAGNOSIS — E2740 Unspecified adrenocortical insufficiency: Secondary | ICD-10-CM | POA: Diagnosis not present

## 2022-07-07 DIAGNOSIS — E43 Unspecified severe protein-calorie malnutrition: Secondary | ICD-10-CM | POA: Diagnosis not present

## 2022-07-07 DIAGNOSIS — D638 Anemia in other chronic diseases classified elsewhere: Secondary | ICD-10-CM | POA: Diagnosis not present

## 2022-07-07 LAB — CBC
HCT: 27.1 % — ABNORMAL LOW (ref 36.0–46.0)
Hemoglobin: 9 g/dL — ABNORMAL LOW (ref 12.0–15.0)
MCH: 30.2 pg (ref 26.0–34.0)
MCHC: 33.2 g/dL (ref 30.0–36.0)
MCV: 90.9 fL (ref 80.0–100.0)
Platelets: 207 10*3/uL (ref 150–400)
RBC: 2.98 MIL/uL — ABNORMAL LOW (ref 3.87–5.11)
RDW: 16.4 % — ABNORMAL HIGH (ref 11.5–15.5)
WBC: 6.9 10*3/uL (ref 4.0–10.5)
nRBC: 0 % (ref 0.0–0.2)

## 2022-07-07 LAB — BASIC METABOLIC PANEL
Anion gap: 5 (ref 5–15)
BUN: 67 mg/dL — ABNORMAL HIGH (ref 6–20)
CO2: 27 mmol/L (ref 22–32)
Calcium: 8.3 mg/dL — ABNORMAL LOW (ref 8.9–10.3)
Chloride: 109 mmol/L (ref 98–111)
Creatinine, Ser: 1.18 mg/dL — ABNORMAL HIGH (ref 0.44–1.00)
GFR, Estimated: >60 mL/min (ref >60)
Glucose, Bld: 180 mg/dL — ABNORMAL HIGH (ref 70–99)
Potassium: 5.3 mmol/L — ABNORMAL HIGH (ref 3.5–5.1)
Sodium: 141 mmol/L (ref 135–145)

## 2022-07-07 LAB — BPAM RBC
Blood Product Expiration Date: 202403082359
ISSUE DATE / TIME: 202402130953
Unit Type and Rh: 6200

## 2022-07-07 LAB — TYPE AND SCREEN
ABO/RH(D): A POS
Antibody Screen: NEGATIVE
Unit division: 0

## 2022-07-07 LAB — PHOSPHORUS: Phosphorus: 3.5 mg/dL (ref 2.5–4.6)

## 2022-07-07 LAB — GLUCOSE, CAPILLARY
Glucose-Capillary: 160 mg/dL — ABNORMAL HIGH (ref 70–99)
Glucose-Capillary: 233 mg/dL — ABNORMAL HIGH (ref 70–99)
Glucose-Capillary: 135 mg/dL — ABNORMAL HIGH (ref 70–99)
Glucose-Capillary: 174 mg/dL — ABNORMAL HIGH (ref 70–99)
Glucose-Capillary: 260 mg/dL — ABNORMAL HIGH (ref 70–99)

## 2022-07-07 LAB — MAGNESIUM: Magnesium: 1.9 mg/dL (ref 1.7–2.4)

## 2022-07-07 MED ORDER — AMLODIPINE BESYLATE 5 MG PO TABS
5.0000 mg | ORAL_TABLET | Freq: Every day | ORAL | Status: DC
  Administered 2022-07-07 – 2022-07-09 (×3): 5 mg via ORAL
  Filled 2022-07-07 (×3): qty 1

## 2022-07-07 MED ORDER — OXYCODONE HCL 5 MG PO TABS: 5.0000 mg | ORAL_TABLET | Freq: Four times a day (QID) | ORAL | Status: DC | PRN

## 2022-07-07 MED ORDER — OXYCODONE HCL 5 MG PO TABS: 5.0000 mg | ORAL_TABLET | Freq: Three times a day (TID) | ORAL | Status: DC | PRN

## 2022-07-07 MED ORDER — GLUCERNA SHAKE PO LIQD
237.0000 mL | Freq: Three times a day (TID) | ORAL | Status: DC
  Administered 2022-07-07 – 2022-07-09 (×5): 237 mL via ORAL

## 2022-07-07 NOTE — TOC Progression Note (Addendum)
Transition of Care [facility]) - Progression Note    Patient Details  Name: Ashley Camacho MRN: [id number] Date of Birth: 06/20/1990  Transition of Care [facility]) CM/SW Contact  Gerilyn Pilgrim, LCSW Phone Number: 07/07/2022, 3:14 PM  Clinical Narrative:   Medication [facility] to [facility]. We are still working on finding a PCP to see pt. Group home checking cost of medications.   3:254pm Per Pharmacist, pt can get a 90 day supply of meds at [facility] outpatient pharmacy. CSW will communicate with group home on this and schedule PCP follow up.   3:44pm  Appt scheduled at [place] for March 28th at 4:00pm.   Expected Discharge Plan:  (TBD) Barriers to Discharge: Continued Medical Work up  Expected Discharge Plan and Services In-house Referral: Development worker, community, Nutrition Discharge Planning Services: CM Consult   Living arrangements for the past 2 months: [place] Determinants of Health (SDOH) Interventions SDOH Screenings   Food Insecurity: No Food Insecurity (06/10/2022)  Housing: Low Risk  (06/10/2022)  Transportation Needs: No Transportation Needs (06/10/2022)  Utilities: Not At Risk (06/10/2022)  Depression (PHQ2-9): Low Risk  (11/11/2020)  Tobacco Use: Low Risk  (06/17/2022)    Readmission Risk Interventions    11/03/2021   10:37 AM 06/16/2021   12:30 PM 06/03/2021    4:26 PM  Readmission Risk Prevention Plan  Transportation Screening Complete Complete Complete  Medication Review Press photographer) Complete Complete Complete  PCP or Specialist appointment within 3-5 days of discharge Complete Complete Complete  HRI or Home Care Consult Not Complete  Not Complete  HRI or Home Care Consult Pt Refusal Comments NA  NA  SW Recovery Care/Counseling Consult Not Complete Complete Complete  SW Consult Not Complete Comments NA    Palliative Care Screening Not Applicable Not Applicable Not  [place] Not Applicable Not Applicable Not Applicable

## 2022-07-07 NOTE — Progress Notes (Signed)
Nutrition Follow-up  DOCUMENTATION CODES:   Underweight, Severe malnutrition in context of chronic illness  INTERVENTION:   -MVI with minerals daily -Glucerna Shake po TID, each supplement provides 220 kcal and 10 grams of protein  -D/c j-tube feeds -30 ml free water flush every 4 hours to maintain tube patency   NUTRITION DIAGNOSIS:   Severe Malnutrition related to social / environmental circumstances as evidenced by moderate muscle depletion, severe muscle depletion, moderate fat depletion, severe fat depletion, percent weight loss.  Ongoing  GOAL:   Patient will meet greater than or equal to 90% of their needs  Progressing   MONITOR:   PO intake, Supplement acceptance, Diet advancement  REASON FOR ASSESSMENT:   Consult Assessment of nutrition requirement/status, Calorie Count  ASSESSMENT:   Pt with PMH of type 1 DM, HTN, gastroparesis, anemia, and adrenal insufficiency admitted for evaluation of weakness and vomiting.  1/19- IR placed NGT 1/21- s/p open j-tube placement  Reviewed I/O's: +1.6 L x 24 hours and +24.3 L since 06/23/22  Case discussed with TOC and MD. Attempting placement in group home, however, TF complicating pt's discharge disposition. Calorie count performed on 2/8-2/11/24; pt able to consume 100% of nutritional needs via PO. At that time, MD requested to continue TF so pt could learn how to administer TF in anticipation that pt did not do well off erythromycin.   Per discussion with TOC, recommending re-assess pt to see if pt able to d/c TF to help with discharge disposition. Informed MD of above discussion. Per MD, d/c nocturnal feeds today. Per MD, possible plan to d/c Friday (07/09/22).   Pt remains ion a soft diet and consuming 90-100% of meals.   Wt steadily increasing since admission, which is favorable, given pt's malnutrition.   Medications reviewed and include remeron.   Labs reviewed: K: 5.3, CBGS: 135-233 (inpatient orders for glycemic  control are 0-6 units insulin aspart every 4 hours, 1 units insulin aspart TID with meals, and 5 units insulin detemir BID).    Diet Order:   Diet Order             DIET SOFT Room service appropriate? Yes; Fluid consistency: Thin  Diet effective now                   EDUCATION NEEDS:   Education needs have been addressed  Skin:  Skin Assessment: Skin Integrity Issues: Skin Integrity Issues:: Incisions Incisions: closed abdomen s/p j-tube placement  Last BM:  07/06/22  Height:   Ht Readings from Last 1 Encounters:  06/10/22 5' 1"$  (1.549 m)    Weight:   Wt Readings from Last 1 Encounters:  07/07/22 51.1 kg    Ideal Body Weight:  47.7 kg  BMI:  Body mass index is 21.29 kg/m.  Estimated Nutritional Needs:   Kcal:  1650-1850  Protein:  80-95 grams  Fluid:  > 1.6 L    Loistine Chance, RD, LDN, [place] Registered Dietitian II Certified Diabetes Care and Education Specialist Please refer to [facility] for RD and/or RD on-call/weekend/after hours pager

## 2022-07-07 NOTE — Progress Notes (Signed)
Progress Note   Patient: Ashley Camacho [id number] DOB: February 09, 1991 DOA: 06/09/2022     28 DOS: the patient was seen and examined on 07/07/2022   Brief hospital course: 32 year old female brought to the emergency room for weakness, vomiting and lethargy.  She was found to be in diabetic ketoacidosis.  She also has a history of depression and stopped taking her medication secondary to cost.  Patient has a history of adrenal insufficiency and type 1 diabetes.  Prolonged and complicated hospital admission with detailed course below.  Currently being trained to self-administer nocturnal tube feeds, and has persistent / recurrent electrolyte abnormalities and anemia needing transfusion.  1/18.  Tapered off insulin drip.  No tolerance for PO intake due to refractory N/V related to gastroparesis 1/19.  Patient agreeable to Dobbhoff feeding tube, it was placed by IR, but did not advance past stomach after 3 days.  Dobhoff removed 1/21.  Jejunostomy tube placed by Dr. Dahlia Byes. 1/22.  Fluid bolus for low BP;s & stress dose steroids. Transfused 1 units for Hbg 7.4.   1/23.  Patient feeling a little bit better.  Steroids tapered back to home Cortef.  Had some intermittent vomiting yesterday.  Up to 30 mL/hr tube feeds by J-tube.  1/24 >> 1/31 recurrent N/V and abdominal discomfort with increasing tube feed rate.   1/25: stopped Reglan, increased erythromycin,  1/26: pt eating a sandwich, N/V improved today.  .  1/28: no N/V past couple days, tolerating meals much better, requesting Glucerna shakes.  1/29: transition to nocturnal feeds tonight. She continues to do well.   1/30: hypoglycemic this AM (44) - insulin was given overnight despite tube feeds not started 1/31: Nocturnal feeds started, she tolerated at 55 cc/hr (goal 75 cc/hr).    2/7 >> 2/13: calorie count done. Eating adequate calories, but pt eating much better here than she is able to at home.   Tapered and stopped erythromycin (per GI max  is 2 weeks). Monitoring closely for recurrent motility issues. Patient wants to continue nocturnal feeds. Night nurses should be training to her manage tube feeds. Needed 1 unit RBC's today 2/13. Recurrent hyperkalemia remains an issue. Sugars typically are VERY labile, but stable for past few days. 2/14: Discontinue tube feeds.  TOC looking into placement.  Remains hyperkalemic.  Blood pressure running high so added amlodipine  Assessment and Plan: * Hypotension Improved with stress dose steroids, blood transfusion and fluids.  Tapered off stress dose steroids.  Resumed on home dose Cortef. Monitor BP  DKA (diabetic ketoacidosis) ([place]) Type 1 diabetes with DKA on admission.  Sugars have been labile since admission, more steady since tolerating PO intake better.   Sugars are VERY labile, she is extremely insulin sensitive. --Continue 5 units Levemir qHS --Novolog 1 unit TID WC + very sensitive SSI --CBG's Q4H --Adjust for goal 140-180 --Hypoglycemia protocol --Appreciate diabetes coordinator input --Referral on d/c to [facility], Dr. Gabriel Carina.  Gastroparesis Diabetic gastroparesis.  Continue trial of Reglan and erythromycin (limit erythromycin to 2 weeks). GI recommendations from 1/18-19 reviewed.   She has persistent N/V and abdominal pain on tube feeds. 1/26: since stopping Reglan yesterday and increasing erythromycin, reducing tube feed rate, she feels much better. Actually feels like eating for the first time. 1/27>>31: continues to do well, tolerating meals, no N/V. --Discussed with GI (1/25), appreciate input --Stopped Reglan (hx of this not working for her & could be contributing to N/V)  --Treated with erythromycin to 100 mg TID with excellent response.  No N/V recurrence for some time.   --Per GI, erythromycin duration max 2 weeks.  It has been tapered and discontinued --Monitor closely for recurrent symptoms --May ultimately benefit from tertiary center referral for  consideration of G-POEM procedure --Advised patient keep food diary & learn which foods are best tolerated, and look at [facility] patient info PDF on gastroparesis diet --Appreciate collaboration with Dietitian.  Acute kidney injury superimposed on chronic kidney disease ([place]) Acute kidney injury on CKD likely stage II.  Creatinine 2.58 on presentation and briefly normalized.   Lab Results  Component Value Date   CREATININE 1.18 (H) 07/07/2022   CREATININE 1.37 (H) 07/06/2022   CREATININE 1.29 (H) 07/05/2022     Acute metabolic encephalopathy-resolved as of 06/17/2022 Resolved. Secondary to diabetic ketoacidosis.    Protein-calorie malnutrition, severe BMI 21.29 Jejunostomy tube placed on 1/21 by Dr. Dahlia Byes.  Started on tube feeds. --Appreciate Dietitian's recs 1/24>>29: started and titrated up tube feeds.  Started tolerating meals again, no N/V.  1/30: transitioning to nocturnal feeds, adjusting insulin accordingly --Added Remeron for appetite stimulant, mood disorder/s and sleep. -- Stopping nocturnal feeds now as she is eating well.  Discussed with nutritionist  Hypophosphatemia Refeeding Syndrome Replaced on 1/22, 1/26, 1/27 1/28 given IV phos-Nak 1/29 phos 4.1 1/30 phos 3.3 Has stabilized, monitor intermittently.   Hypomagnesemia Replaced & resolved. Continue to monitor  Adrenal insufficiency (HCC) Initially required stress dose steroids.   Transitioned to PO Cortef on 1/24. On Cortef 20 mg daily at 0800, 10 mg at 1500 Recommend endocrinology follow up outpatient. Pt requests referral to [facility] due to transportation issues getting to [facility]  Thrombocytopenia ([place]) Appears chronic.  Monitor CBC. Platelets 207     Latest Ref Rng & Units 07/07/2022    5:10 AM 07/06/2022    7:49 PM 07/06/2022    3:56 AM  CBC  WBC 4.0 - 10.5 K/uL 6.9   6.8   Hemoglobin 12.0 - 15.0 g/dL 9.0  9.1  6.9   Hematocrit 36.0 - 46.0 % 27.1  28.0  21.1   Platelets 150 -  400 K/uL 207   214      Chronic combined systolic and diastolic CHF (congestive heart failure) ([place]) Last echo 02/27/2021 -- EF 50-55% and normal diastolic parameters, apparently prior EF as low as 30%. --Monitor volume status with strict I/O's and daily weights --Given IV Lasix 20 mg intermittently, last on 2/9  --Further diuresis as needed, assess daily Monitor for now.   Pseudohyponatremia Sodium initially low secondary to diabetic ketoacidosis.  Last sodium in the normal range  Major depressive disorder, recurrent episode, moderate (HCC) Seen by psychiatry. They started Prozac. Added Remeron at bedtime to help with mood, sleep and appetite stimulation  Hypothyroidism TSH slightly high.  Restarted levothyroxine at a slightly higher dose  Stage 3b chronic kidney disease ([place]) See AKI.  At baseline now  Lab Results  Component Value Date   CREATININE 1.18 (H) 07/07/2022   CREATININE 1.37 (H) 07/06/2022   CREATININE 1.29 (H) 07/05/2022     Jejunostomy tube present ([place]) .  Placed by Dr. Adora Fridge on 1/21 for tube feeds  Hypovolemic shock ([place]) Due to DKA and refractory N/V.  Now resolved  Insomnia She used to be on Remeron for depression/anxiety and appetite stimulant. Appears not on this PTA, unclear when she last was taking it, she does not recall. --Started Remeron 15 mg at bedtime --As needed melatonin  Underweight BMI 21.29 History of eating disorder as  well. Now with J tube -was on nocturnal feeds, in an attempt to optimize nutrition status.   With N/V improved, she is eating adequately.  Discussed with nutritionist.  Will stop nocturnal tube feeds at this time. Pt may have some food security issues as home, reports she is able to eat much better here than at home.  Hyperkalemia K 5.3.  Has has recurrent potassium elevation requiring Lokelma.  Monitor today.  Will recheck tomorrow to decide need for [facility] Monitor BMP Will need PCP appt very close to discharge  for repeat labs  Anemia of chronic disease Status post transfusion of 2 units pRBC's  (on admission, and on 1/22) earlier this admission.   Hbg has slowly trended down to 6.9. No evidence of bleeding. --Transfused 1 unit pRBC's on 2/13 --Monitor CBC, transfuse PRN if < 7 --Recommend Hematology to follow after discharge for outpatient transfusions if needed.   Type 1 diabetes mellitus with hypoglycemia without coma ([place]) See DKA for mgmt.        Subjective: Sitting in the bed.  Requesting if she could go home  Physical Exam: Vitals:   07/06/22 2048 07/07/22 0500 07/07/22 0839 07/07/22 1223  BP: (!) 157/86  (!) 171/93 (!) 135/90  Pulse: 89  90 95  Resp: 18  18 18  $ Temp: 97.8 F (36.6 C)  98 F (36.7 C) 98.4 F (36.9 C)  TempSrc:      SpO2: 100%  100% 100%  Weight:  51.1 kg    Height:       General exam: awake, alert, no acute distress, chronically ill appearing, has gained weight HEENT: moist mucus membranes, hearing grossly normal  Respiratory system: CTAB, no wheezes, rales or rhonchi, normal respiratory effort. Cardiovascular system: normal S1/S2, RRR, no pedal edema.   Gastrointestinal system: soft, NT, ND, J tube present Central nervous system: A&O x4. no gross focal neurologic deficits, normal speech Extremities: moves all, no edema, normal tone Skin: dry, intact, normal temperature, normal color, No rashes, lesions or ulcers Psychiatry: normal mood, congruent affect, judgement and insight appear normal  Data Reviewed:  K5.3  Family Communication: None today  Disposition: Status is: Inpatient Remains inpatient appropriate because: Hyperkalemia.  Elevated blood pressure.  Monitoring her for nutritional status of tube feeds and waiting for placement  Planned Discharge Destination: Skilled nursing facility   DVT prophylaxis-SCD Time spent: 35 minutes  Author: Max Sane, MD 07/07/2022 2:09 PM  For on call review [email or web address].

## 2022-07-08 DIAGNOSIS — I9589 Other hypotension: Secondary | ICD-10-CM | POA: Diagnosis not present

## 2022-07-08 DIAGNOSIS — E43 Unspecified severe protein-calorie malnutrition: Secondary | ICD-10-CM | POA: Diagnosis not present

## 2022-07-08 DIAGNOSIS — G9341 Metabolic encephalopathy: Secondary | ICD-10-CM | POA: Diagnosis not present

## 2022-07-08 DIAGNOSIS — K3184 Gastroparesis: Secondary | ICD-10-CM | POA: Diagnosis not present

## 2022-07-08 DIAGNOSIS — D638 Anemia in other chronic diseases classified elsewhere: Secondary | ICD-10-CM | POA: Diagnosis not present

## 2022-07-08 DIAGNOSIS — E2740 Unspecified adrenocortical insufficiency: Secondary | ICD-10-CM | POA: Diagnosis not present

## 2022-07-08 LAB — CBC
HCT: 27.5 % — ABNORMAL LOW (ref 36.0–46.0)
Hemoglobin: 9 g/dL — ABNORMAL LOW (ref 12.0–15.0)
MCH: 30.3 pg (ref 26.0–34.0)
MCHC: 32.7 g/dL (ref 30.0–36.0)
MCV: 92.6 fL (ref 80.0–100.0)
Platelets: 217 10*3/uL (ref 150–400)
RBC: 2.97 MIL/uL — ABNORMAL LOW (ref 3.87–5.11)
RDW: 15.9 % — ABNORMAL HIGH (ref 11.5–15.5)
WBC: 7 10*3/uL (ref 4.0–10.5)
nRBC: 0 % (ref 0.0–0.2)

## 2022-07-08 LAB — BASIC METABOLIC PANEL
Anion gap: 6 (ref 5–15)
BUN: 62 mg/dL — ABNORMAL HIGH (ref 6–20)
CO2: 25 mmol/L (ref 22–32)
Calcium: 8.2 mg/dL — ABNORMAL LOW (ref 8.9–10.3)
Chloride: 110 mmol/L (ref 98–111)
Creatinine, Ser: 1.34 mg/dL — ABNORMAL HIGH (ref 0.44–1.00)
GFR, Estimated: 54 mL/min — ABNORMAL LOW (ref >60)
Glucose, Bld: 109 mg/dL — ABNORMAL HIGH (ref 70–99)
Potassium: 4.8 mmol/L (ref 3.5–5.1)
Sodium: 141 mmol/L (ref 135–145)

## 2022-07-08 LAB — GLUCOSE, CAPILLARY
Glucose-Capillary: 119 mg/dL — ABNORMAL HIGH (ref 70–99)
Glucose-Capillary: 120 mg/dL — ABNORMAL HIGH (ref 70–99)
Glucose-Capillary: 143 mg/dL — ABNORMAL HIGH (ref 70–99)
Glucose-Capillary: 158 mg/dL — ABNORMAL HIGH (ref 70–99)
Glucose-Capillary: 283 mg/dL — ABNORMAL HIGH (ref 70–99)
Glucose-Capillary: 284 mg/dL — ABNORMAL HIGH (ref 70–99)

## 2022-07-08 MED ORDER — INSULIN ASPART 100 UNIT/ML IJ SOLN: 0.0000 [IU] | Freq: Every day | INTRAMUSCULAR | Status: DC

## 2022-07-08 MED ORDER — INSULIN ASPART 100 UNIT/ML IJ SOLN: 0.0000 [IU] | Freq: Three times a day (TID) | INTRAMUSCULAR | Status: DC

## 2022-07-08 MED ORDER — INSULIN ASPART 100 UNIT/ML IJ SOLN
0.0000 [IU] | Freq: Three times a day (TID) | INTRAMUSCULAR | Status: DC
  Administered 2022-07-08 (×2): 5 [IU] via SUBCUTANEOUS
  Administered 2022-07-09: 4 [IU] via SUBCUTANEOUS
  Filled 2022-07-08 (×3): qty 1

## 2022-07-08 NOTE — Assessment & Plan Note (Signed)
Sodium initially low secondary to diabetic ketoacidosis.  Last sodium in the normal range.

## 2022-07-08 NOTE — Assessment & Plan Note (Signed)
Placed by Dr. Adora Fridge on 1/21 for tube feeds

## 2022-07-08 NOTE — Assessment & Plan Note (Addendum)
Normalized now, likely due to refeeding syndrome

## 2022-07-08 NOTE — Assessment & Plan Note (Signed)
Improved with stress dose steroids, blood transfusion and fluids.  Tapered off stress dose steroids.  Resumed on home dose Cortef.

## 2022-07-08 NOTE — Assessment & Plan Note (Signed)
Initially required stress dose steroids.   Transitioned to PO Cortef on 1/24. On Cortef 20 mg daily at 0800, 10 mg at 1500 Recommend endocrinology follow up outpatient. Pt requests referral to [facility] Endocrinology due to transportation issues getting to [facility].

## 2022-07-08 NOTE — Assessment & Plan Note (Signed)
She used to be on Remeron for depression/anxiety and appetite stimulant. Appears not on this PTA, unclear when she last was taking it, she does not recall. -- Continue Remeron 15 mg at bedtime --As needed melatonin

## 2022-07-08 NOTE — Assessment & Plan Note (Signed)
See AKI.  At baseline now.  Lab Results  Component Value Date   CREATININE 1.34 (H) 07/08/2022   CREATININE 1.18 (H) 07/07/2022   CREATININE 1.37 (H) 07/06/2022

## 2022-07-08 NOTE — Assessment & Plan Note (Signed)
Appears chronic.  Monitor CBC.     Latest Ref Rng & Units 07/08/2022    3:58 AM 07/07/2022    5:10 AM 07/06/2022    7:49 PM  CBC  WBC 4.0 - 10.5 K/uL 7.0  6.9    Hemoglobin 12.0 - 15.0 g/dL 9.0  9.0  9.1   Hematocrit 36.0 - 46.0 % 27.5  27.1  28.0   Platelets 150 - 400 K/uL 217  207

## 2022-07-08 NOTE — Assessment & Plan Note (Addendum)
BMI 20.99 Jejunostomy tube placed on 1/21 by Dr. Dahlia Byes.  Started on tube feeds. --Appreciate Dietitian's recs 1/24>>29: started and titrated up tube feeds.  Started tolerating meals again, no N/V.  1/30: transitioning to nocturnal feeds, adjusting insulin accordingly --Added Remeron for appetite stimulant, mood disorder/s and sleep. -- Off tube feeds now

## 2022-07-08 NOTE — Assessment & Plan Note (Signed)
Due to DKA and refractory N/V.  Now resolved.

## 2022-07-08 NOTE — Assessment & Plan Note (Signed)
Acute kidney injury on CKD likely stage II.  Creatinine 2.58 on presentation and briefly normalized  Lab Results  Component Value Date   CREATININE 1.34 (H) 07/08/2022   CREATININE 1.18 (H) 07/07/2022   CREATININE 1.37 (H) 07/06/2022

## 2022-07-08 NOTE — Assessment & Plan Note (Signed)
See DKA for mgmt

## 2022-07-08 NOTE — Assessment & Plan Note (Signed)
TSH slightly high.  Restarted levothyroxine at a slightly higher dose.

## 2022-07-08 NOTE — TOC Progression Note (Signed)
Transition of Care [facility]) - Progression Note    Patient Details  Name: Ashley Camacho MRN: [id number] Date of Birth: 03-18-1991  Transition of Care [facility] CM/SW Contact  Gerilyn Pilgrim, LCSW Phone Number: 07/08/2022, 1:14 PM  Clinical Narrative:   CSW spoke with pt regarding care at group home. CSW explained that pt is approved for 90 day supply of medications and arranged for PCP follow up at a PCP near the group home.Pt reports that she does not want to go to group home any longer. CSW explained that the conditions she was living in have not changed and she would likely readmit to the hospital if not having needed supports for medications and food. Pt states she is getting more money in her check and feels she can buy healthier foods and [facility]. She reports she will use the bus and figure the rest out once she gets home. Pt anticipates getting approved for medicaid and will use medicaid transport.  DSS, [place] notified. Joy coming to see the patient today.   Expected Discharge Plan:  (TBD) Barriers to Discharge: Continued Medical Work up  Expected Discharge Plan and Services In-house Referral: Development worker, community, Nutrition Discharge Planning Services: CM Consult   Living arrangements for the past 2 months: Single Family Home                                       Social Determinants of Health (SDOH) Interventions SDOH Screenings   Food Insecurity: No Food Insecurity (06/10/2022)  Housing: Low Risk  (06/10/2022)  Transportation Needs: No Transportation Needs (06/10/2022)  Utilities: Not At Risk (06/10/2022)  Depression (PHQ2-9): Low Risk  (11/11/2020)  Tobacco Use: Low Risk  (06/17/2022)    Readmission Risk Interventions    11/03/2021   10:37 AM 06/16/2021   12:30 PM 06/03/2021    4:26 PM  Readmission Risk Prevention Plan  Transportation Screening Complete Complete Complete  Medication Review Press photographer) Complete Complete  Complete  PCP or Specialist appointment within 3-5 days of discharge Complete Complete Complete  HRI or [place] Not Complete  Not Complete  HRI or Home Care Consult Pt Refusal Comments NA  NA  SW Recovery Care/Counseling Consult Not Complete Complete Complete  SW Consult Not Complete Comments NA    Palliative Care Screening Not Applicable Not Applicable Not [place] Not Applicable Not Applicable Not Applicable

## 2022-07-08 NOTE — Progress Notes (Signed)
Mobility Specialist - Progress Note     07/08/22 0927  Mobility  Level of Assistance Independent  Distance Ambulated (ft) 800 ft  Range of Motion/Exercises Active  Activity Response Tolerated well  Mobility Referral Yes  $Mobility charge 1 Mobility   Pt resting supine in bed on RA upon entry. Pt STS and ambulates to hallway around NS Indep with no AD. Pt returned to EOB and left with needs in reach and bed alarm activated.   Loma Sender Mobility Specialist 07/08/22, 9:30 AM

## 2022-07-08 NOTE — Progress Notes (Signed)
Progress Note   Patient: Ashley Camacho [id number] DOB: 12-08-1990 DOA: 06/09/2022     32 DOS: the patient was seen and examined on 07/08/2022   Brief hospital course: 32 year old female brought to the emergency room for weakness, vomiting and lethargy.  She was found to be in diabetic ketoacidosis.  She also has a history of depression and stopped taking her medication secondary to cost.  Patient has a history of adrenal insufficiency and type 1 diabetes.  Prolonged and complicated hospital admission with detailed course below.  Currently being trained to self-administer nocturnal tube feeds, and has persistent / recurrent electrolyte abnormalities and anemia needing transfusion.  1/18.  Tapered off insulin drip.  No tolerance for PO intake due to refractory N/V related to gastroparesis 1/19.  Patient agreeable to Dobbhoff feeding tube, it was placed by IR, but did not advance past stomach after 3 days.  Dobhoff removed 1/21.  Jejunostomy tube placed by Dr. Dahlia Byes. 1/22.  Fluid bolus for low BP;s & stress dose steroids. Transfused 1 units for Hbg 7.4.   1/23.  Patient feeling a little bit better.  Steroids tapered back to home Cortef.  Had some intermittent vomiting yesterday.  Up to 30 mL/hr tube feeds by J-tube.  1/24 >> 1/31 recurrent N/V and abdominal discomfort with increasing tube feed rate.   1/25: stopped Reglan, increased erythromycin,  1/26: pt eating a sandwich, N/V improved today.  .  1/28: no N/V past couple days, tolerating meals much better, requesting Glucerna shakes.  1/29: transition to nocturnal feeds tonight. She continues to do well.   1/30: hypoglycemic this AM (44) - insulin was given overnight despite tube feeds not started 1/31: Nocturnal feeds started, she tolerated at 55 cc/hr (goal 75 cc/hr).    2/7 >> 2/13: calorie count done. Eating adequate calories, but pt eating much better here than she is able to at home.   Tapered and stopped erythromycin (per GI max  is 2 weeks). Monitoring closely for recurrent motility issues. Patient wants to continue nocturnal feeds. Night nurses should be training to her manage tube feeds. Needed 1 unit RBC's today 2/13. Recurrent hyperkalemia remains an issue. Sugars typically are VERY labile, but stable for past few days. 2/14: Discontinue tube feeds.  TOC looking into placement.  Remains hyperkalemic.  Blood pressure running high so added amlodipine 2/15: Patient prefers to go home instead of group home  Assessment and Plan: * Hypotension Improved with stress dose steroids, blood transfusion and fluids.  Tapered off stress dose steroids.  Resumed on home dose Cortef.  DKA (diabetic ketoacidosis) ([place]) Type 1 diabetes with DKA on admission.  Sugars have been labile since admission, more steady since tolerating PO intake better.   Sugars are VERY labile, she is extremely insulin sensitive. --Appreciate diabetes coordinator input --Referral on d/c to [facility] Endocrinology, Dr. Gabriel Carina.  Gastroparesis Diabetic gastroparesis.  Continue trial of Reglan and erythromycin (limit erythromycin to 2 weeks). GI recommendations from 1/18-19 reviewed.   --Treated with erythromycin to 100 mg TID with excellent response.  No N/V recurrence for some time.   --Per GI, erythromycin duration max 2 weeks.  It has been tapered and discontinued --Monitor closely for recurrent symptoms --May ultimately benefit from tertiary center referral for consideration of G-POEM procedure --Advised patient keep food diary & learn which foods are best tolerated, and look at [facility] patient info PDF on gastroparesis diet --Appreciate collaboration with Dietitian.  Acute kidney injury superimposed on chronic kidney disease (HCC) Acute kidney  injury on CKD likely stage II.  Creatinine 2.58 on presentation and briefly normalized  Lab Results  Component Value Date   CREATININE 1.34 (H) 07/08/2022   CREATININE 1.18 (H) 07/07/2022    CREATININE 1.37 (H) 07/06/2022     Acute metabolic encephalopathy-resolved as of 06/17/2022 Resolved. Secondary to diabetic ketoacidosis.    Protein-calorie malnutrition, severe BMI 20.99 Jejunostomy tube placed on 1/21 by Dr. Dahlia Byes.  Started on tube feeds. --Appreciate Dietitian's recs 1/24>>32: started and titrated up tube feeds.  Started tolerating meals again, no N/V.  1/30: transitioning to nocturnal feeds, adjusting insulin accordingly --Added Remeron for appetite stimulant, mood disorder/s and sleep. -- Off tube feeds now  Hypophosphatemia Normalized now, likely due to refeeding syndrome   Hypomagnesemia Replaced & resolved.  Adrenal insufficiency ([place]) Initially required stress dose steroids.   Transitioned to PO Cortef on 1/24. On Cortef 20 mg daily at 0800, 10 mg at 1500 Recommend endocrinology follow up outpatient. Pt requests referral to [facility] Endocrinology due to transportation issues getting to [facility].  Thrombocytopenia (HCC) Appears chronic.  Monitor CBC.     Latest Ref Rng & Units 07/08/2022    3:58 AM 07/07/2022    5:10 AM 07/06/2022    7:49 PM  CBC  WBC 4.0 - 10.5 K/uL 7.0  6.9    Hemoglobin 12.0 - 15.0 g/dL 9.0  9.0  9.1   Hematocrit 36.0 - 46.0 % 27.5  27.1  28.0   Platelets 150 - 400 K/uL 217  207       Chronic combined systolic and diastolic CHF (congestive heart failure) ([place]) Last echo 02/27/2021 -- EF 50-55% and normal diastolic parameters, apparently prior EF as low as 30%. --Monitor volume status with strict I/O's and daily weights --Given IV Lasix 20 mg intermittently, last on 2/9     Pseudohyponatremia Sodium initially low secondary to diabetic ketoacidosis.  Last sodium in the normal range.  Major depressive disorder, recurrent episode, moderate (HCC) Continue Prozac and Remeron per psychiatry Remeron at bedtime to help with mood, sleep and appetite stimulation  Hypothyroidism TSH slightly high.  Restarted levothyroxine at a slightly  higher dose.  Stage 3b chronic kidney disease ([place]) See AKI.  At baseline now.  Lab Results  Component Value Date   CREATININE 1.34 (H) 07/08/2022   CREATININE 1.18 (H) 07/07/2022   CREATININE 1.37 (H) 07/06/2022     Jejunostomy tube present ([place]) Placed by Dr. Adora Fridge on 1/21 for tube feeds  Hypovolemic shock ([place]) Due to DKA and refractory N/V.  Now resolved.  Insomnia She used to be on Remeron for depression/anxiety and appetite stimulant. Appears not on this PTA, unclear when she last was taking it, she does not recall. -- Continue Remeron 15 mg at bedtime --As needed melatonin  Hyperkalemia Normalized - has recurrent potassium elevation requiring Lokelma. Will need PCP appt very close to discharge for repeat labs  Anemia of chronic disease Status post transfusion of 2 units pRBC's  (on admission, and on 1/22) earlier this admission.   Hbg has slowly trended down to 6.9. No evidence of bleeding --Transfused 1 unit pRBC's on 2/13 --Monitor CBC, transfuse PRN if < 7 --Recommend Hematology to follow after discharge for outpatient transfusions if needed.   Type 1 diabetes mellitus with hypoglycemia without coma ([place]) See DKA for mgmt        Subjective: Sitting in the bed, hoping that she could go home instead of group home  Physical Exam: Vitals:   07/07/22 2000 07/08/22 0400  07/08/22 0500 07/08/22 0722  BP: (!) 150/83 (!) 159/88  (!) 157/89  Pulse: 88 83  99  Resp: 18 18  16  $ Temp: 97.8 F (36.6 C) 98 F (36.7 C)  98 F (36.7 C)  TempSrc: Oral Oral    SpO2: 100% 100%  100%  Weight:   50.4 kg   Height:       General exam: awake, alert, no acute distress, chronically ill appearing, has gained weight HEENT: moist mucus membranes, hearing grossly normal  Respiratory system: CTAB, no wheezes, rales or rhonchi, normal respiratory effort. Cardiovascular system: normal S1/S2, RRR, no pedal edema.   Gastrointestinal system: soft, NT, ND, J tube present Central  nervous system: A&O x4. no gross focal neurologic deficits, normal speech Extremities: moves all, no edema, normal tone Skin: dry, intact, normal temperature, normal color, No rashes, lesions or ulcers Psychiatry: normal mood, congruent affect, judgement and insight appear normal  Data Reviewed:  Hemoglobin 9.0  Family Communication: None at bedside  Disposition: Status is: Inpatient Remains inpatient appropriate because: Coordinating medications for potential discharge tomorrow as she prefers to go home instead of group  Planned Discharge Destination: Home   DVT prophylaxis-SCDs Time spent: 35 minutes  Author: Max Sane, MD 07/08/2022 12:41 PM  For on call review [email or web address].

## 2022-07-08 NOTE — Assessment & Plan Note (Addendum)
Continue Prozac and Remeron per psychiatry Remeron at bedtime to help with mood, sleep and appetite stimulation

## 2022-07-08 NOTE — Assessment & Plan Note (Signed)
Last echo 02/27/2021 -- EF 50-55% and normal diastolic parameters, apparently prior EF as low as 30%. --Monitor volume status with strict I/O's and daily weights --Given IV Lasix 20 mg intermittently, last on 2/9

## 2022-07-08 NOTE — Assessment & Plan Note (Signed)
Diabetic gastroparesis.  Continue trial of Reglan and erythromycin (limit erythromycin to 2 weeks). GI recommendations from 1/18-19 reviewed.   --Treated with erythromycin to 100 mg TID with excellent response.  No N/V recurrence for some time.   --Per GI, erythromycin duration max 2 weeks.  It has been tapered and discontinued --Monitor closely for recurrent symptoms --May ultimately benefit from tertiary center referral for consideration of G-POEM procedure --Advised patient keep food diary & learn which foods are best tolerated, and look at [facility] patient info PDF on gastroparesis diet --Appreciate collaboration with Dietitian.

## 2022-07-08 NOTE — Assessment & Plan Note (Deleted)
BMI 21.29 History of eating disorder as well. Now with J tube -was on nocturnal feeds, in an attempt to optimize nutrition status.   With N/V improved, she is eating adequately.  Tube feeds stopped Pt may have some food security issues as home, reports she is able to eat much better here than at home.

## 2022-07-08 NOTE — Assessment & Plan Note (Signed)
Type 1 diabetes with DKA on admission.  Sugars have been labile since admission, more steady since tolerating PO intake better.   Sugars are VERY labile, she is extremely insulin sensitive. --Appreciate diabetes coordinator input --Referral on d/c to [facility] Endocrinology, Dr. Gabriel Carina.

## 2022-07-08 NOTE — Assessment & Plan Note (Signed)
Normalized - has recurrent potassium elevation requiring Lokelma. Will need PCP appt very close to discharge for repeat labs

## 2022-07-08 NOTE — Assessment & Plan Note (Signed)
Status post transfusion of 2 units pRBC's  (on admission, and on 1/22) earlier this admission.   Hbg has slowly trended down to 6.9. No evidence of bleeding --Transfused 1 unit pRBC's on 2/13 --Monitor CBC, transfuse PRN if < 7 --Recommend Hematology to follow after discharge for outpatient transfusions if needed.

## 2022-07-08 NOTE — Assessment & Plan Note (Signed)
Replaced & resolved.

## 2022-07-09 ENCOUNTER — Other Ambulatory Visit: Payer: Self-pay

## 2022-07-09 DIAGNOSIS — N189 Chronic kidney disease, unspecified: Secondary | ICD-10-CM | POA: Diagnosis not present

## 2022-07-09 DIAGNOSIS — E2740 Unspecified adrenocortical insufficiency: Secondary | ICD-10-CM | POA: Diagnosis not present

## 2022-07-09 DIAGNOSIS — I9589 Other hypotension: Secondary | ICD-10-CM | POA: Diagnosis not present

## 2022-07-09 DIAGNOSIS — N179 Acute kidney failure, unspecified: Secondary | ICD-10-CM | POA: Diagnosis not present

## 2022-07-09 DIAGNOSIS — E43 Unspecified severe protein-calorie malnutrition: Secondary | ICD-10-CM | POA: Diagnosis not present

## 2022-07-09 LAB — GLUCOSE, CAPILLARY
Glucose-Capillary: 40 mg/dL — CL (ref 70–99)
Glucose-Capillary: 41 mg/dL — CL (ref 70–99)
Glucose-Capillary: 106 mg/dL — ABNORMAL HIGH (ref 70–99)
Glucose-Capillary: 303 mg/dL — ABNORMAL HIGH (ref 70–99)

## 2022-07-09 MED ORDER — FLUOXETINE HCL 10 MG PO CAPS
10.0000 mg | ORAL_CAPSULE | Freq: Every day | ORAL | 0 refills | Status: DC
  Filled 2022-07-09: qty 30, 30d supply, fill #0

## 2022-07-09 MED ORDER — MIRTAZAPINE 15 MG PO TABS
15.0000 mg | ORAL_TABLET | Freq: Every day | ORAL | 0 refills | Status: DC
  Filled 2022-07-09: qty 30, 30d supply, fill #0

## 2022-07-09 MED ORDER — INSULIN ASPART 100 UNIT/ML IJ SOLN
0.0000 [IU] | Freq: Three times a day (TID) | INTRAMUSCULAR | 11 refills | Status: DC
  Filled 2022-07-09: qty 10, 37d supply, fill #0

## 2022-07-09 MED ORDER — BASAGLAR KWIKPEN 100 UNIT/ML ~~LOC~~ SOPN
4.0000 [IU] | PEN_INJECTOR | Freq: Two times a day (BID) | SUBCUTANEOUS | 0 refills | Status: DC
  Filled 2022-07-09: qty 15, 188d supply, fill #0

## 2022-07-09 MED ORDER — GLUCERNA SHAKE PO LIQD
237.0000 mL | Freq: Three times a day (TID) | ORAL | 0 refills | Status: DC
  Filled 2022-07-09: qty 21330, 30d supply, fill #0

## 2022-07-09 MED ORDER — PROMETHAZINE HCL 25 MG PO TABS
12.5000 mg | ORAL_TABLET | Freq: Four times a day (QID) | ORAL | 0 refills | Status: DC | PRN
  Filled 2022-07-09: qty 15, 8d supply, fill #0

## 2022-07-09 MED ORDER — NOVOLOG FLEXPEN 100 UNIT/ML ~~LOC~~ SOPN
0.0000 [IU] | PEN_INJECTOR | Freq: Three times a day (TID) | SUBCUTANEOUS | 11 refills | Status: DC
  Filled 2022-07-09: qty 15, 39d supply, fill #0

## 2022-07-09 MED ORDER — MELATONIN 5 MG PO TABS
5.0000 mg | ORAL_TABLET | Freq: Every evening | ORAL | 0 refills | Status: DC | PRN
  Filled 2022-07-09: qty 30, 30d supply, fill #0

## 2022-07-09 MED ORDER — HYDROCORTISONE 10 MG PO TABS
10.0000 mg | ORAL_TABLET | Freq: Every day | ORAL | 0 refills | Status: DC
  Filled 2022-07-09: qty 60, 20d supply, fill #0

## 2022-07-09 MED ORDER — INSULIN ASPART 100 UNIT/ML IJ SOLN
2.0000 [IU] | Freq: Three times a day (TID) | INTRAMUSCULAR | 0 refills | Status: DC
  Filled 2022-07-09: qty 10, 160d supply, fill #0

## 2022-07-09 MED ORDER — FERROUS SULFATE 325 (65 FE) MG PO TABS
325.0000 mg | ORAL_TABLET | Freq: Every day | ORAL | 0 refills | Status: DC
  Filled 2022-07-09: qty 30, 30d supply, fill #0

## 2022-07-09 MED ORDER — ADULT MULTIVITAMIN W/MINERALS CH
1.0000 | ORAL_TABLET | Freq: Every day | ORAL | 0 refills | Status: DC
  Filled 2022-07-09: qty 30, 30d supply, fill #0

## 2022-07-09 MED ORDER — LEVOTHYROXINE SODIUM 50 MCG PO TABS
50.0000 ug | ORAL_TABLET | Freq: Every day | ORAL | 0 refills | Status: DC
  Filled 2022-07-09: qty 30, 30d supply, fill #0

## 2022-07-09 MED ORDER — INSULIN ASPART 100 UNIT/ML IJ SOLN
0.0000 [IU] | Freq: Every day | INTRAMUSCULAR | 0 refills | Status: DC
  Filled 2022-07-09: qty 1.5, 30d supply, fill #0

## 2022-07-09 MED ORDER — AMLODIPINE BESYLATE 5 MG PO TABS
5.0000 mg | ORAL_TABLET | Freq: Every day | ORAL | 0 refills | Status: DC
  Filled 2022-07-09: qty 30, 30d supply, fill #0

## 2022-07-09 NOTE — Progress Notes (Signed)
Mobility Specialist - Progress Note     07/09/22 1317  Mobility  Activity Ambulated independently in hallway;Stood at bedside  Level of Assistance Independent  Assistive Device None  Distance Ambulated (ft) 640 ft  Range of Motion/Exercises Active  Activity Response Tolerated well  Mobility Referral Yes  $Mobility charge 1 Mobility   Pt resting EOB on RA upon entry. Pt STS and ambulates to hallway around NS with no AD. Pt returned to EOB and left with needs in reach.   Loma Sender Mobility Specialist 07/09/22, 1:19 PM

## 2022-07-09 NOTE — Progress Notes (Signed)
Received MD note to discharge  patient to home, reviewed homes meds, prescriptions, follow up appointments and peg tube care with patient and patient verbalizing understanding.

## 2022-07-09 NOTE — TOC Transition Note (Signed)
Transition of Care [facility]) - CM/SW Discharge Note   Patient Details  Name: Ashley Camacho MRN: [id number] Date of Birth: 02-14-1991  Transition of Care [facility]) CM/SW Contact:  Gerilyn Pilgrim, LCSW Phone Number: 07/09/2022, 12:29 PM   Clinical Narrative:   Pt has orders to discharge. Pt approved for 90 day supply of medications at [facility]. Pt given information for [facility] to [facility] up for primary care. CSW signing off.     Final next level of care: Home/Self Care Barriers to Discharge: Barriers Resolved   Patient Goals and CMS Choice CMS Medicare.gov Compare Post Acute Care list provided to:: Patient    Discharge Placement                         Discharge Plan and Services Additional resources added to the After Visit Summary for   In-house Referral: Development worker, community, Nutrition Discharge Planning Services: CM Consult                                 Social Determinants of Health (SDOH) Interventions SDOH Screenings   Food Insecurity: No Food Insecurity (06/10/2022)  Housing: Low Risk  (06/10/2022)  Transportation Needs: No Transportation Needs (06/10/2022)  Utilities: Not At Risk (06/10/2022)  Depression (PHQ2-9): Low Risk  (11/11/2020)  Tobacco Use: Low Risk  (06/17/2022)     Readmission Risk Interventions    11/03/2021   10:37 AM 06/16/2021   12:30 PM 06/03/2021    4:26 PM  Readmission Risk Prevention Plan  Transportation Screening Complete Complete Complete  Medication Review Press photographer) Complete Complete Complete  PCP or Specialist appointment within 3-5 days of discharge Complete Complete Complete  HRI or Home Care Consult Not Complete  Not Complete  HRI or Home Care Consult Pt Refusal Comments NA  NA  SW Recovery Care/Counseling Consult Not Complete Complete Complete  SW Consult Not Complete Comments NA    Palliative Care Screening Not Applicable Not Applicable Not [place] Not Applicable Not  Applicable Not Applicable

## 2022-07-09 NOTE — Inpatient Diabetes Management (Signed)
Inpatient Diabetes Program Recommendations  AACE/ADA: New Consensus Statement on Inpatient Glycemic Control (2015)  Target Ranges:  Prepandial:   less than 140 mg/dL      Peak postprandial:   less than 180 mg/dL (1-2 hours)      Critically ill patients:  140 - 180 mg/dL   Lab Results  Component Value Date   GLUCAP 40 (LL) 07/09/2022   HGBA1C 10.2 (H) 02/28/2022    Latest Reference Range & Units 07/08/22 07:30 07/08/22 12:22 07/08/22 15:14 07/08/22 19:55 07/09/22 07:39 07/09/22 07:42  Glucose-Capillary 70 - 99 mg/dL 120 (H) 283 (H) 284 (H) 158 (H) 41 (LL) 40 (LL)  (LL): Data is critically low (H): Data is abnormally high  Diabetes history: DM1 Outpatient Diabetes medications:  Novolog 2 unit tid with meals Lantus 4 units bid Current orders for Inpatient glycemic control: Levemir 5 units bid, Novolog 0-9 units tid, 0-5 units hs, Cortef 20 mg AM/ 10 mg PM   Inpatient Diabetes Program Recommendations:  Fasting CBG this am   Change Novolog correction to 0-6 units tid, 0-5 units hs Decrease Levemir to 4 units bid Spoke with patient and she has phone compatible with continuous glucose sensor application and has used Dexcom G6 In the past but does not have anymore sensors to use. Will plan to give patient Dexcom 7 sensors for home use-Permission given by Dr. Manuella Ghazi. Discussed with patient the hypoglycemia event this am and patient shared she normally eats a snack @ hs but didn't last pm. Discussed strategies for home management of hypoglycemia and patient states understanding.  Thank you, Nani Gasser. Norlene Lanes, RN, MSN, CDE  Diabetes Coordinator Inpatient Glycemic Control Team Team Pager [phone] (8am-5pm) 07/09/2022 9:04 AM

## 2022-07-10 NOTE — Discharge Summary (Addendum)
Physician Discharge Summary   Patient: Ashley Camacho MRN: [id number] DOB: 28-Dec-1990  Admit date:     06/09/2022  Discharge date: 07/09/2022  Discharge Physician: Max Sane   PCP: Pcp, No   Recommendations at discharge:   Follow-up with outpatient providers as requested  Discharge Diagnoses: Principal Problem:   Hypotension Active Problems:   Acute kidney injury superimposed on chronic kidney disease (HCC)   Gastroparesis   DKA (diabetic ketoacidosis) (HCC)   Chronic combined systolic and diastolic CHF (congestive heart failure) (HCC)   Thrombocytopenia (HCC)   Adrenal insufficiency (HCC)   Hypomagnesemia   Hypophosphatemia   Protein-calorie malnutrition, severe   Hypothyroidism   Major depressive disorder, recurrent episode, moderate (HCC)   Pseudohyponatremia   Type 1 diabetes mellitus with hypoglycemia without coma (HCC)   Anemia of chronic disease   Hypoglycemia   Hyperkalemia   Insomnia   Hypovolemic shock (HCC)   Jejunostomy tube present (HCC)   Stage 3b chronic kidney disease ([place])  Resolved Problems:   Acute metabolic encephalopathy  Hospital Course: 32 year old female brought to the emergency room for weakness, vomiting and lethargy.  She was found to be in diabetic ketoacidosis.  She also has a history of depression and stopped taking her medication secondary to cost.  Patient has a history of adrenal insufficiency and type 1 diabetes.  Prolonged and complicated hospital admission with detailed course below.  Currently being trained to self-administer nocturnal tube feeds, and has persistent / recurrent electrolyte abnormalities and anemia needing transfusion.  1/18.  Tapered off insulin drip.  No tolerance for PO intake due to refractory N/V related to gastroparesis 1/19.  Patient agreeable to Dobbhoff feeding tube, it was placed by IR, but did not advance past stomach after 3 days.  Dobhoff removed 1/21.  Jejunostomy tube placed by Dr. Dahlia Byes. 1/22.   Fluid bolus for low BP;s & stress dose steroids. Transfused 1 units for Hbg 7.4.   1/23.  Patient feeling a little bit better.  Steroids tapered back to home Cortef.  Had some intermittent vomiting yesterday.  Up to 30 mL/hr tube feeds by J-tube.  1/24 >> 1/31 recurrent N/V and abdominal discomfort with increasing tube feed rate.   1/25: stopped Reglan, increased erythromycin,  1/26: pt eating a sandwich, N/V improved today.  .  1/28: no N/V past couple days, tolerating meals much better, requesting Glucerna shakes.  1/29: transition to nocturnal feeds tonight. She continues to do well.   1/30: hypoglycemic this AM (44) - insulin was given overnight despite tube feeds not started 1/31: Nocturnal feeds started, she tolerated at 55 cc/hr (goal 75 cc/hr).    2/7 >> 2/13: calorie count done. Eating adequate calories, but pt eating much better here than she is able to at home.   Tapered and stopped erythromycin (per GI max is 2 weeks). Monitoring closely for recurrent motility issues. Patient wants to continue nocturnal feeds. Night nurses should be training to her manage tube feeds. Needed 1 unit RBC's today 2/13. Recurrent hyperkalemia remains an issue. Sugars typically are VERY labile, but stable for past few days. 2/14: Discontinue tube feeds.  TOC looking into placement.  Remains hyperkalemic.  Blood pressure running high so added amlodipine 2/15: Patient prefers to go home instead of group home  Assessment and Plan: * Hypotension Improved with stress dose steroids, blood transfusion and fluids.  Tapered off stress dose steroids.  Resumed on home dose Cortef at discharge  DKA (diabetic ketoacidosis) ([place]) Type 1 diabetes with DKA  on admission.  Sugars have been labile since admission, more steady since tolerating PO intake better.   Sugars are VERY labile, she is extremely insulin sensitive. --Appreciate diabetes coordinator input --Referral on d/c to [facility], Dr.  Gabriel Carina.  Gastroparesis Diabetic gastroparesis.  Continue trial of Reglan and erythromycin (limit erythromycin to 2 weeks). GI seen --Treated with erythromycin to 100 mg TID with excellent response.  No N/V recurrence for some time.   --Per GI, erythromycin duration max 2 weeks.  It has been tapered and discontinued --May ultimately benefit from tertiary center referral for consideration of G-POEM procedure --Advised patient keep food diary & learn which foods are best tolerated, and look at [facility] patient info PDF on gastroparesis diet --Appreciate collaboration with Dietitian.  Acute kidney injury superimposed on chronic kidney disease 3B ([place]) Acute kidney injury on CKD likely stage II.  Creatinine 2.58 on presentation and briefly normalized  Acute metabolic encephalopathy-resolved as of 06/17/2022 Likely secondary to diabetic ketoacidosis.    Protein-calorie malnutrition, severe BMI 20.99 Jejunostomy tube placed on 1/21 by Dr. Dahlia Byes.  Started on tube feeds. 1/24>>29: started and titrated up tube feeds.  Started tolerating meals again, no N/V.  1/30: transitioning to nocturnal feeds, adjusting insulin accordingly --Added Remeron for appetite stimulant, mood disorder/s and sleep. -- Off tube feeds now as maintaining oral nutrition -Jejunostomy tube was left in place if in case patient fails to maintain her nutrition as an outpatient so as she can resume tube feeds.  If she does not need it this can be removed as an outpatient by following with GI and or surgery.  Hypophosphatemia Normalized now, likely due to refeeding syndrome Hypomagnesemia Replaced & resolved.  Adrenal insufficiency ([place]) Initially required stress dose steroids.   Transitioned to PO Cortef on 1/24. On Cortef 20 mg daily at 0800, 10 mg at 1500 Recommend endocrinology follow up outpatient. Pt requests referral to [facility] due to transportation issues getting to [facility].  Thrombocytopenia  (HCC) Appears chronic and stable  Chronic combined systolic and diastolic CHF (congestive heart failure) ([place]) Last echo 02/27/2021 -- EF 50-55% and normal diastolic parameters, apparently prior EF as low as 30%.  Pseudohyponatremia Sodium initially low secondary to diabetic ketoacidosis.  Last sodium in the normal range.  Major depressive disorder, recurrent episode, moderate (HCC) Continue Prozac and Remeron per psychiatry Remeron at bedtime to help with mood, sleep and appetite stimulation  Hypothyroidism TSH slightly high.  Restarted levothyroxine at a slightly higher dose.  Jejunostomy tube present [facility]) Placed by Dr. Dahlia Byes on 1/21 for tube feeds  Hypovolemic shock ([place]) Due to DKA and refractory N/V.  Now resolved.  Insomnia She used to be on Remeron for depression/anxiety and appetite stimulant.  -- Continue Remeron 15 mg at bedtime  Hyperkalemia Normalized - has recurrent potassium elevation requiring Lokelma. Will need PCP appt very close to discharge for repeat labs  Anemia of chronic disease Status post transfusion of 3 units pRBC's  (on admission, 1/22 and 2/13)  No evidence of bleeding --Transfused 1 unit pRBC's on 2/13 --Recommend Hematology to follow after discharge for outpatient transfusions if needed.  Patient was given 90-day supply of medications from [place].  TOC been looking to place her at area group home but patient refused and decided to go home.  She is requested to follow-up with family medicine on March 28 at 4 PM as scheduled      Consultants: Surgery, GI, psychiatry Procedures performed: Jejunostomy tube placement by Dr. Dahlia Byes on  06/13/2022 Disposition: Home Diet recommendation:  Discharge Diet Orders (From admission, onward)     Start     Ordered   07/09/22 0000  Diet - low sodium heart healthy        07/09/22 1227           Carb modified diet DISCHARGE MEDICATION: Allergies as of 07/09/2022   No Known Allergies       Medication List     STOP taking these medications    BD Insulin Syringe U/F 31G X 5/16" 0.3 ML Misc Generic drug: Insulin Syringe-Needle U-100   bumetanide 2 MG tablet Commonly known as: BUMEX   carvedilol 12.5 MG tablet Commonly known as: COREG   Lantus 100 UNIT/ML injection Generic drug: insulin glargine Replaced by: Basaglar KwikPen 100 UNIT/ML   pantoprazole 40 MG tablet Commonly known as: PROTONIX       TAKE these medications    Acetaminophen Extra Strength 500 MG Tabs Take 2 tablets (1,000 mg total) by mouth every 6 (six) hours as needed for headache, fever or moderate pain.   amLODipine 5 MG tablet Commonly known as: NORVASC Take 1 tablet (5 mg total) by mouth daily.   Basaglar KwikPen 100 UNIT/ML Inject 4 Units into the skin 2 (two) times daily. Replaces: Lantus 100 UNIT/ML injection   feeding supplement (GLUCERNA SHAKE) Liqd Take 237 mLs by mouth 3 (three) times daily between meals.   FeroSul 325 (65 FE) MG tablet Generic drug: ferrous sulfate Take 1 tablet (325 mg total) by mouth daily with breakfast.   FLUoxetine 10 MG capsule Commonly known as: PROZAC Take 1 capsule (10 mg total) by mouth daily.   hydrocortisone 10 MG tablet Commonly known as: CORTEF Take 1-2 tablets (10-20 mg total) by mouth daily. Take 2 tablets (20 mg) in the morning and 1 tablet (10 mg) in the evening. What changed: when to take this   insulin aspart 100 UNIT/ML injection Commonly known as: NovoLOG Inject 2 Units into the skin 3 (three) times daily with meals. What changed: Another medication with the same name was added. Make sure you understand how and when to take each.   insulin aspart 100 UNIT/ML injection Commonly known as: novoLOG Inject 0-5 Units into the skin at bedtime. What changed: You were already taking a medication with the same name, and this prescription was added. Make sure you understand how and when to take each.   NovoLOG FlexPen 100 UNIT/ML  FlexPen Generic drug: insulin aspart Inject 2 units into the skin 3 (three) times daily with meals. In addition, inject 0-9 Units into the skin 3 (three) times daily with meals based on your blood sugar. Also inject 0-5 units into the skin every night at bedtime based on your blood sugar. See attached directions. What changed: You were already taking a medication with the same name, and this prescription was added. Make sure you understand how and when to take each.   levothyroxine 50 MCG tablet Commonly known as: SYNTHROID Take 1 tablet (50 mcg total) by mouth daily at 6 (six) AM. What changed:  medication strength how much to take when to take this   melatonin 5 MG Tabs Take 1 tablet (5 mg total) by mouth at bedtime as needed.   mirtazapine 15 MG tablet Commonly known as: REMERON Take 1 tablet (15 mg total) by mouth at bedtime.   multivitamin with minerals Tabs tablet Take 1 tablet by mouth daily.   promethazine 25 MG tablet Commonly known as: PHENERGAN  Take 0.5 tablets (12.5 mg total) by mouth every 6 (six) hours as needed for nausea or vomiting.        Follow-up Information     [facility]. Go to.   Why: March 28th at 4:00pm, go to [email or web address] to do new patient paperwork and sliding scale paperwork, ASAP. Contact information: [address] [place], [place] [address]        [facility] on 08/19/2022.   Why: At 4 PM as scheduled Contact information: [address] [place] [address] [phone]         Tylene Fantasia, PA-C. Schedule an appointment as soon as possible for a visit in 2 week(s).   Specialty: Physician Assistant Why: Office closed at this time Patient to make own follow up appt  [facility] Discharge F/UP Contact information: [address] [place] [address] [phone]         Jonathon Bellows, MD. Schedule an appointment as soon as possible for a visit in 4 week(s).    Specialty: Gastroenterology Why: Office closed .Marland KitchenMarland KitchenI left a message for office to call patient to set up appt  [facility] Discharge F/UP Contact information: [place] [address] [phone]                Discharge Exam: Filed Weights   07/07/22 0500 07/08/22 0500 07/09/22 0500  Weight: 51.1 kg 50.4 kg 51.5 kg   General exam: awake, alert, no acute distress, chronically ill appearing, has gained weight HEENT: moist mucus membranes, hearing grossly normal  Respiratory system: CTAB, no wheezes, rales or rhonchi, normal respiratory effort. Cardiovascular system: normal S1/S2, RRR, no pedal edema.   Gastrointestinal system: soft, NT, ND, J tube present Central nervous system: A&O x4. no gross focal neurologic deficits, normal speech Extremities: moves all, no edema, normal tone Skin: dry, intact, normal temperature, normal color, No rashes, lesions or ulcers Psychiatry: normal mood, congruent affect, judgement and insight appear normal  Condition at discharge: fair  The results of significant diagnostics from this hospitalization (including imaging, microbiology, ancillary and laboratory) are listed below for reference.   Imaging Studies: DG Abd 1 View  Result Date: 06/17/2022 CLINICAL DATA:  Abdomen distension EXAM: ABDOMEN - 1 VIEW COMPARISON:  06/12/2022 FINDINGS: Nonobstructed gas pattern. Interim placement left abdominal jejunostomy catheter, the tip overlies the left lower quadrant. IMPRESSION: Interim placement of left abdominal jejunostomy catheter with tip at the left lower quadrant. Nonobstructed gas pattern. Electronically Signed   By: Donavan Foil M.D.   On: 06/17/2022 15:07   DG Abd 1 View  Result Date: 06/12/2022 CLINICAL DATA:  NG tube placement.  Abdominal pain. EXAM: ABDOMEN - 1 VIEW COMPARISON:  Earlier same day. FINDINGS: Enteric tube without significant change with tip over the distal stomach in the midline of the mid  abdomen. Bowel gas pattern is nonobstructive. No free peritoneal air. Remainder of the exam is unchanged. IMPRESSION: Nonobstructive bowel gas pattern.  NG tube as described. Electronically Signed   By: Marin Olp M.D.   On: 06/12/2022 14:10   DG Abd 1 View  Result Date: 06/12/2022 CLINICAL DATA:  Feeding tube placement. EXAM: ABDOMEN - 1 VIEW COMPARISON:  06/11/2022 FINDINGS: Dobbhoff tube with the metallic tip projecting over the antrum of the stomach. No bowel dilatation to suggest obstruction. No evidence of pneumoperitoneum, portal venous gas or pneumatosis. No pathologic calcifications along the expected course of the ureters. No acute osseous  abnormality. IMPRESSION: 1. Dobbhoff tube with the metallic tip projecting over the antrum of the stomach. Electronically Signed   By: Kathreen Devoid M.D.   On: 06/12/2022 09:09   DG [facility] Dys Tube Plc W/Fl W/Rad  Result Date: 06/12/2022 INDICATION: Patient with gastroparesis, unable to tolerate oral intake. Need for post-pyloric tube feedings EXAM: NASO G TUBE PLACEMENT WITH FL AND WITH RAD COMPARISON:  None FLUOROSCOPY TIME:  Radiation Exposure Index (if provided by fluoroscopic device): 1.70 mGy Air Kerma COMPLICATIONS: None immediate PROCEDURE: The right nostril was prepped with viscous lidocaine and the Dobbhoff tube was lubricated with viscous lidocaine inserted into the right nostril. Under intermittent fluoroscopic guidance, the Dobbhoff tube was easily advanced into the esophagus and stomach, the wire was removed and 180 mL of air was instilled to stimulate peristalsis. The Dobbhoff tube was then passed into the duodenum with tip ultimately terminating in the stomach proximal to pyloric sphincter. A fluoroscopic image was saved for documentation purposes. The tube was affixed to the patient's nose with tape. The patient tolerated the procedure well without immediate postprocedural complication. FINDINGS: Dobhoff placement via right nare with tip currently  terminating in stomach near the pylorus. Tip was unable to be advanced to duodenal-jejunal junction IMPRESSION: Successful fluoroscopic guided placement of Dobhoff tube with tip terminating over the duodenojejunal junction. The tube is not ready for use, and ordering provider has been notified. Follow-up abdominal x-ray ordered for 06/12/22. Performed by Reatha Armour PA-C, and supervised and interpreted by Dr Kathreen Devoid. Electronically Signed   By: Kathreen Devoid M.D.   On: 06/12/2022 07:17    Microbiology: Results for orders placed or performed during the hospital encounter of 06/09/22  Blood Culture (routine x 2)     Status: None   Collection Time: 06/09/22  5:44 PM   Specimen: BLOOD  Result Value Ref Range Status   Specimen Description BLOOD BLOOD RIGHT ARM  Final   Special Requests   Final    BOTTLES DRAWN AEROBIC AND ANAEROBIC Blood Culture results may not be optimal due to an inadequate volume of blood received in culture bottles   Culture   Final    NO GROWTH 5 DAYS Performed at [facility], [address], [place], [place] [address]    Report Status 06/14/2022 FINAL  Final  Blood Culture (routine x 2)     Status: None   Collection Time: 06/09/22  5:48 PM   Specimen: BLOOD  Result Value Ref Range Status   Specimen Description BLOOD LEFT ANTECUBITAL  Final   Special Requests   Final    BOTTLES DRAWN AEROBIC AND ANAEROBIC Blood Culture adequate volume   Culture   Final    NO GROWTH 5 DAYS Performed at [facility], [address] [place], [place] [address]    Report Status 06/14/2022 FINAL  Final  Resp panel by RT-PCR (RSV, Flu A&B, Covid) Anterior Nasal Swab     Status: None   Collection Time: 06/09/22  8:30 PM   Specimen: Anterior Nasal Swab  Result Value Ref Range Status   SARS Coronavirus 2 by RT PCR NEGATIVE NEGATIVE Final    Comment: (NOTE) SARS-CoV-2 target nucleic acids are NOT DETECTED.  The SARS-CoV-2 RNA is generally detectable in upper  respiratory specimens during the acute phase of infection. The lowest concentration of SARS-CoV-2 viral copies this assay can detect is 138 copies/mL. A negative result does not preclude SARS-Cov-2 infection and should not be used as the sole basis for  treatment or other patient management decisions. A negative result may occur with  improper specimen collection/handling, submission of specimen other than nasopharyngeal swab, presence of viral mutation(s) within the areas targeted by this assay, and inadequate number of viral copies(<138 copies/mL). A negative result must be combined with clinical observations, patient history, and epidemiological information. The expected result is Negative.  Fact Sheet for Patients:  EntrepreneurPulse.com.au  Fact Sheet for Healthcare Providers:  IncredibleEmployment.be  This test is no t yet approved or cleared by the [place] FDA and  has been authorized for detection and/or diagnosis of SARS-CoV-2 by FDA under an Emergency Use Authorization (EUA). This EUA will remain  in effect (meaning this test can be used) for the duration of the COVID-19 declaration under Section 564(b)(1) of the Act, 21 U.S.C.section 360bbb-3(b)(1), unless the authorization is terminated  or revoked sooner.       Influenza A by PCR NEGATIVE NEGATIVE Final   Influenza B by PCR NEGATIVE NEGATIVE Final    Comment: (NOTE) The Xpert Xpress SARS-CoV-2/FLU/RSV plus assay is intended as an aid in the diagnosis of influenza from Nasopharyngeal swab specimens and should not be used as a sole basis for treatment. Nasal washings and aspirates are unacceptable for Xpert Xpress SARS-CoV-2/FLU/RSV testing.  Fact Sheet for Patients: EntrepreneurPulse.com.au  Fact Sheet for Healthcare Providers: IncredibleEmployment.be  This test is not yet approved or cleared by the [place] FDA and has been  authorized for detection and/or diagnosis of SARS-CoV-2 by FDA under an Emergency Use Authorization (EUA). This EUA will remain in effect (meaning this test can be used) for the duration of the COVID-19 declaration under Section 564(b)(1) of the Act, 21 U.S.C. section 360bbb-3(b)(1), unless the authorization is terminated or revoked.     Resp Syncytial Virus by PCR NEGATIVE NEGATIVE Final    Comment: (NOTE) Fact Sheet for Patients: EntrepreneurPulse.com.au  Fact Sheet for Healthcare Providers: IncredibleEmployment.be  This test is not yet approved or cleared by the [place] FDA and has been authorized for detection and/or diagnosis of SARS-CoV-2 by FDA under an Emergency Use Authorization (EUA). This EUA will remain in effect (meaning this test can be used) for the duration of the COVID-19 declaration under Section 564(b)(1) of the Act, 21 U.S.C. section 360bbb-3(b)(1), unless the authorization is terminated or revoked.  Performed at [facility], [address], [place], [place] [address]   Urine Culture     Status: None   Collection Time: 06/09/22 10:59 PM   Specimen: In/Out Cath Urine  Result Value Ref Range Status   Specimen Description   Final    IN/OUT CATH URINE Performed at [facility], [address] [place], [place] [address]    Special Requests   Final    NONE Performed at [facility], [address] [place], [place] [address]    Culture   Final    NO GROWTH Performed at [facility], [place] [address] [place], [place] [address]    Report Status 06/11/2022 FINAL  Final  MRSA Next Gen by PCR, Nasal     Status: None   Collection Time: 06/10/22  5:43 AM   Specimen: Nasal Mucosa; Nasal Swab  Result Value Ref Range Status   MRSA by PCR Next Gen NOT DETECTED NOT DETECTED Final    Comment: (NOTE) The GeneXpert MRSA Assay (FDA approved for NASAL specimens only), is one component of a  comprehensive MRSA colonization surveillance program. It is not intended to diagnose MRSA infection  nor to guide or monitor treatment for MRSA infections. Test performance is not FDA approved in patients less than 84 years old. Performed at [facility], [address] [place], [place] [address]     Labs: CBC: Recent Labs  Lab 07/04/22 0440 07/05/22 0551 07/06/22 0356 07/06/22 1949 07/07/22 0510 07/08/22 0358  WBC 7.2  --  6.8  --  6.9 7.0  HGB 7.2* 7.2* 6.9* 9.1* 9.0* 9.0*  HCT 22.3* 22.2* 21.1* 28.0* 27.1* 27.5*  MCV 97.4  --  96.8  --  90.9 92.6  PLT 198  --  214  --  207 [id number]   Basic Metabolic Panel: Recent Labs  Lab 07/04/22 0440 07/05/22 0551 07/06/22 0356 07/07/22 0510 07/08/22 0358  NA 142 142 141 141 141  K 5.1 5.4* 5.4* 5.3* 4.8  CL 110 110 108 109 110  CO2 26 28 26 27 25  $ GLUCOSE 160* 150* 157* 180* 109*  BUN 76* 75* 79* 67* 62*  CREATININE 1.33* 1.29* 1.37* 1.18* 1.34*  CALCIUM 8.3* 8.4* 8.4* 8.3* 8.2*  MG  --   --   --  1.9  --   PHOS  --   --   --  3.5  --    Liver Function Tests: No results for input(s): "AST", "ALT", "ALKPHOS", "BILITOT", "PROT", "ALBUMIN" in the last 168 hours. CBG: Recent Labs  Lab 07/08/22 1955 07/09/22 0739 07/09/22 0742 07/09/22 0842 07/09/22 1122  GLUCAP 158* 41* 40* 106* 303*    Discharge time spent: greater than 30 minutes.  Signed: Max Sane, MD [facility] 07/10/2022

## 2022-07-23 DIAGNOSIS — Z419 Encounter for procedure for purposes other than remedying health state, unspecified: Secondary | ICD-10-CM | POA: Diagnosis not present

## 2022-07-26 ENCOUNTER — Encounter: Payer: Self-pay | Admitting: Surgery

## 2022-07-26 ENCOUNTER — Ambulatory Visit (INDEPENDENT_AMBULATORY_CARE_PROVIDER_SITE_OTHER): Payer: Medicaid Other | Admitting: Surgery

## 2022-07-26 VITALS — BP 116/82 | HR 86 | Temp 98.0°F | Ht 61.0 in | Wt 95.6 lb

## 2022-07-26 DIAGNOSIS — Z934 Other artificial openings of gastrointestinal tract status: Secondary | ICD-10-CM

## 2022-07-26 DIAGNOSIS — E43 Unspecified severe protein-calorie malnutrition: Secondary | ICD-10-CM

## 2022-07-26 DIAGNOSIS — K3184 Gastroparesis: Secondary | ICD-10-CM

## 2022-07-26 NOTE — Patient Instructions (Signed)
You should follow up with Gastroenterology. They should call you to schedule this. If not please call them.   We have sent a referral to nutrition therapy. They will call you to schedule this.   Follow up here in 3 weeks with Dr Dahlia Byes.

## 2022-07-26 NOTE — Progress Notes (Signed)
Outpatient Surgical Follow Up  07/26/2022  Ashley Camacho is an 32 y.o. female.   Chief Complaint  Patient presents with   Routine Post Op    HPI: Ashley Camacho is a 32 y.o. female seen after jejunostomy tube placement. It was uneventful. She is taking PO and not using J tube. She has noticed some recent weight loss. She does have a significant history of diabetes, severe gastroparesis.  Prior C-section. She came in through the ER with altered mental status, failure to thrive weakness and vomiting.  She denies any abdominal pain.  She does feel for.  She does feel weak. She has been unable to f/u w GI or dietician. In the hospital her wight was 37 kg and today is 43.4 kg  Past Medical History:  Diagnosis Date   Acute metabolic encephalopathy 0000000   Adrenal insufficiency (HCC)    Anemia    Gastroparesis    Hypertension    Type 1 diabetes ([place])     Past Surgical History:  Procedure Laterality Date   BIOPSY  01/14/2021   Procedure: BIOPSY;  Surgeon: Thornton Park, MD;  Location: [facility];  Service: Gastroenterology;;   CESAREAN SECTION     CESAREAN SECTION WITH BILATERAL TUBAL LIGATION N/A 02/17/2021   Procedure: CESAREAN SECTION WITH BILATERAL TUBAL LIGATION;  Surgeon: Truett Mainland, DO;  Location: [place] [facility];  Service: Obstetrics;  Laterality: N/A;   ESOPHAGOGASTRODUODENOSCOPY (EGD) WITH PROPOFOL N/A 01/14/2021   Procedure: ESOPHAGOGASTRODUODENOSCOPY (EGD) WITH PROPOFOL;  Surgeon: Thornton Park, MD;  Location: [facility];  Service: Gastroenterology;  Laterality: N/A;   JEJUNOSTOMY N/A 06/13/2022   Procedure: JEJUNOSTOMY;  Surgeon: Jules Husbands, MD;  Location: [facility];  Service: General;  Laterality: N/A;   MOUTH SURGERY      Family History  Problem Relation Age of Onset   Diabetes type I Father    CAD Father    CAD Paternal Grandmother    CAD Paternal Grandfather    Breast cancer Mother    Ovarian cancer Neg Hx     Social History:  reports that  she has never smoked. She has never been exposed to tobacco smoke. She has never used smokeless tobacco. She reports that she does not drink alcohol and does not use drugs.  Allergies: No Known Allergies  Medications reviewed.    ROS Full ROS performed and is otherwise negative other than what is stated in HPI   BP 116/82   Pulse 86   Temp 98 F (36.7 C)   Ht '5\' 1"'$  (1.549 m)   Wt 95 lb 9.6 oz (43.4 kg)   LMP 06/29/2022 (Approximate)   SpO2 99%   BMI 18.06 kg/m   Physical Exam Vitals and nursing note reviewed. Exam conducted with a chaperone present.  Constitutional:      General: She is not in acute distress.    Comments: BMI 18  Cardiovascular:     Rate and Rhythm: Normal rate and regular rhythm.     Heart sounds: No murmur heard.    No friction rub.  Pulmonary:     Effort: Pulmonary effort is normal. No respiratory distress.     Breath sounds: No stridor. No wheezing.  Abdominal:     General: Abdomen is flat. There is no distension.     Palpations: Abdomen is soft. There is no mass.     Tenderness: There is no abdominal tenderness. There is no guarding or rebound.     Hernia: No hernia  is present.     Comments: Jejunostomy tube in place w/o infection, no peritonitis  Musculoskeletal:        General: No swelling or tenderness. Normal range of motion.     Cervical back: Normal range of motion and neck supple. No rigidity or tenderness.  Skin:    General: Skin is warm and dry.     Capillary Refill: Capillary refill takes less than 2 seconds.  Neurological:     General: No focal deficit present.     Mental Status: She is alert and oriented to person, place, and time.  Psychiatric:        Mood and Affect: Mood normal.        Behavior: Behavior normal.        Thought Content: Thought content normal.        Judgment: Judgment normal.     Assessment/Plan: 32 year old female with severe gastroparesis and diabetes mellitus type 1 following after jejunostomy tube.   No evidence of complications related to jejunostomy.  She still has significant gastroparesis and from a nutritional perspective might not be improving.  Recommended to use Glucerna daily , may use jejunostomy tube if unable to take it by mouth.  I have also made referrals for dietitian and gastroenterology respectively.  Dr. Virgina Jock from GI will see her.  We may think that she might be a candidate for G POEM in the future.  RTC 3 weeks or so.  Please note that I have spent 30 minutes ( not related to recent surgery)  in this encounter including personally reviewing imaging studies, coordinating her care, placing orders, counseling the patient and performing appropriate documentation   Caroleen Hamman, MD [facility] Surgeon

## 2022-07-27 ENCOUNTER — Emergency Department: Payer: Medicaid Other

## 2022-07-27 ENCOUNTER — Inpatient Hospital Stay
Admission: EM | Admit: 2022-07-27 | Discharge: 2022-08-05 | DRG: 638 | Disposition: A | Discharge status: Home / Self Care | Payer: Medicaid Other | Attending: Internal Medicine | Admitting: Internal Medicine

## 2022-07-27 ENCOUNTER — Encounter: Payer: Self-pay | Admitting: Internal Medicine

## 2022-07-27 DIAGNOSIS — Z1152 Encounter for screening for COVID-19: Secondary | ICD-10-CM

## 2022-07-27 DIAGNOSIS — G4700 Insomnia, unspecified: Secondary | ICD-10-CM | POA: Diagnosis present

## 2022-07-27 DIAGNOSIS — R55 Syncope and collapse: Secondary | ICD-10-CM | POA: Diagnosis present

## 2022-07-27 DIAGNOSIS — E1022 Type 1 diabetes mellitus with diabetic chronic kidney disease: Secondary | ICD-10-CM | POA: Diagnosis present

## 2022-07-27 DIAGNOSIS — K3184 Gastroparesis: Secondary | ICD-10-CM | POA: Diagnosis present

## 2022-07-27 DIAGNOSIS — Z934 Other artificial openings of gastrointestinal tract status: Secondary | ICD-10-CM

## 2022-07-27 DIAGNOSIS — E871 Hypo-osmolality and hyponatremia: Secondary | ICD-10-CM | POA: Diagnosis not present

## 2022-07-27 DIAGNOSIS — R9431 Abnormal electrocardiogram [ECG] [EKG]: Secondary | ICD-10-CM | POA: Diagnosis present

## 2022-07-27 DIAGNOSIS — E2740 Unspecified adrenocortical insufficiency: Secondary | ICD-10-CM | POA: Diagnosis present

## 2022-07-27 DIAGNOSIS — T383X5A Adverse effect of insulin and oral hypoglycemic [antidiabetic] drugs, initial encounter: Secondary | ICD-10-CM

## 2022-07-27 DIAGNOSIS — N1832 Chronic kidney disease, stage 3b: Secondary | ICD-10-CM | POA: Diagnosis present

## 2022-07-27 DIAGNOSIS — S199XXA Unspecified injury of neck, initial encounter: Secondary | ICD-10-CM | POA: Diagnosis not present

## 2022-07-27 DIAGNOSIS — E1065 Type 1 diabetes mellitus with hyperglycemia: Secondary | ICD-10-CM | POA: Diagnosis present

## 2022-07-27 DIAGNOSIS — Z681 Body mass index (BMI) 19 or less, adult: Secondary | ICD-10-CM

## 2022-07-27 DIAGNOSIS — E162 Hypoglycemia, unspecified: Secondary | ICD-10-CM | POA: Diagnosis not present

## 2022-07-27 DIAGNOSIS — E10649 Type 1 diabetes mellitus with hypoglycemia without coma: Principal | ICD-10-CM | POA: Diagnosis present

## 2022-07-27 DIAGNOSIS — R680 Hypothermia, not associated with low environmental temperature: Secondary | ICD-10-CM | POA: Diagnosis present

## 2022-07-27 DIAGNOSIS — F329 Major depressive disorder, single episode, unspecified: Secondary | ICD-10-CM | POA: Diagnosis present

## 2022-07-27 DIAGNOSIS — E160 Drug-induced hypoglycemia without coma: Secondary | ICD-10-CM

## 2022-07-27 DIAGNOSIS — D638 Anemia in other chronic diseases classified elsewhere: Secondary | ICD-10-CM | POA: Diagnosis present

## 2022-07-27 DIAGNOSIS — E161 Other hypoglycemia: Secondary | ICD-10-CM | POA: Diagnosis not present

## 2022-07-27 DIAGNOSIS — Z8249 Family history of ischemic heart disease and other diseases of the circulatory system: Secondary | ICD-10-CM

## 2022-07-27 DIAGNOSIS — Z79899 Other long term (current) drug therapy: Secondary | ICD-10-CM

## 2022-07-27 DIAGNOSIS — R519 Headache, unspecified: Secondary | ICD-10-CM | POA: Diagnosis not present

## 2022-07-27 DIAGNOSIS — S0990XA Unspecified injury of head, initial encounter: Secondary | ICD-10-CM | POA: Diagnosis not present

## 2022-07-27 DIAGNOSIS — Z794 Long term (current) use of insulin: Secondary | ICD-10-CM

## 2022-07-27 DIAGNOSIS — Z79890 Hormone replacement therapy (postmenopausal): Secondary | ICD-10-CM

## 2022-07-27 DIAGNOSIS — R54 Age-related physical debility: Secondary | ICD-10-CM | POA: Diagnosis present

## 2022-07-27 DIAGNOSIS — E875 Hyperkalemia: Secondary | ICD-10-CM | POA: Diagnosis not present

## 2022-07-27 DIAGNOSIS — N183 Chronic kidney disease, stage 3 unspecified: Secondary | ICD-10-CM | POA: Diagnosis present

## 2022-07-27 DIAGNOSIS — Z833 Family history of diabetes mellitus: Secondary | ICD-10-CM

## 2022-07-27 DIAGNOSIS — F419 Anxiety disorder, unspecified: Secondary | ICD-10-CM | POA: Diagnosis present

## 2022-07-27 DIAGNOSIS — E109 Type 1 diabetes mellitus without complications: Secondary | ICD-10-CM | POA: Diagnosis present

## 2022-07-27 DIAGNOSIS — R636 Underweight: Secondary | ICD-10-CM | POA: Diagnosis present

## 2022-07-27 DIAGNOSIS — T68XXXA Hypothermia, initial encounter: Secondary | ICD-10-CM | POA: Insufficient documentation

## 2022-07-27 DIAGNOSIS — Z803 Family history of malignant neoplasm of breast: Secondary | ICD-10-CM

## 2022-07-27 DIAGNOSIS — E1043 Type 1 diabetes mellitus with diabetic autonomic (poly)neuropathy: Secondary | ICD-10-CM | POA: Diagnosis present

## 2022-07-27 DIAGNOSIS — R0689 Other abnormalities of breathing: Secondary | ICD-10-CM | POA: Diagnosis not present

## 2022-07-27 DIAGNOSIS — D631 Anemia in chronic kidney disease: Secondary | ICD-10-CM | POA: Diagnosis present

## 2022-07-27 DIAGNOSIS — N189 Chronic kidney disease, unspecified: Secondary | ICD-10-CM | POA: Diagnosis present

## 2022-07-27 DIAGNOSIS — I129 Hypertensive chronic kidney disease with stage 1 through stage 4 chronic kidney disease, or unspecified chronic kidney disease: Secondary | ICD-10-CM | POA: Diagnosis present

## 2022-07-27 DIAGNOSIS — I10 Essential (primary) hypertension: Secondary | ICD-10-CM | POA: Diagnosis present

## 2022-07-27 DIAGNOSIS — R413 Other amnesia: Secondary | ICD-10-CM | POA: Diagnosis present

## 2022-07-27 DIAGNOSIS — E11649 Type 2 diabetes mellitus with hypoglycemia without coma: Secondary | ICD-10-CM | POA: Diagnosis not present

## 2022-07-27 DIAGNOSIS — E039 Hypothyroidism, unspecified: Secondary | ICD-10-CM | POA: Diagnosis present

## 2022-07-27 DIAGNOSIS — N179 Acute kidney failure, unspecified: Secondary | ICD-10-CM | POA: Diagnosis present

## 2022-07-27 DIAGNOSIS — R404 Transient alteration of awareness: Secondary | ICD-10-CM | POA: Diagnosis not present

## 2022-07-27 DIAGNOSIS — I4581 Long QT syndrome: Secondary | ICD-10-CM | POA: Diagnosis not present

## 2022-07-27 DIAGNOSIS — Z7952 Long term (current) use of systemic steroids: Secondary | ICD-10-CM

## 2022-07-27 LAB — CBC WITH DIFFERENTIAL/PLATELET
Abs Immature Granulocytes: 0.01 10*3/uL (ref 0.00–0.07)
Basophils Absolute: 0 10*3/uL (ref 0.0–0.1)
Basophils Relative: 1 %
Eosinophils Absolute: 0.1 10*3/uL (ref 0.0–0.5)
Eosinophils Relative: 1 %
HCT: 24.4 % — ABNORMAL LOW (ref 36.0–46.0)
Hemoglobin: 8.4 g/dL — ABNORMAL LOW (ref 12.0–15.0)
Immature Granulocytes: 0 %
Lymphocytes Relative: 26 %
Lymphs Abs: 1.2 10*3/uL (ref 0.7–4.0)
MCH: 30.2 pg (ref 26.0–34.0)
MCHC: 34.4 g/dL (ref 30.0–36.0)
MCV: 87.8 fL (ref 80.0–100.0)
Monocytes Absolute: 0.5 10*3/uL (ref 0.1–1.0)
Monocytes Relative: 11 %
Neutro Abs: 3 10*3/uL (ref 1.7–7.7)
Neutrophils Relative %: 61 %
Platelets: 178 10*3/uL (ref 150–400)
RBC: 2.78 MIL/uL — ABNORMAL LOW (ref 3.87–5.11)
RDW: 13.2 % (ref 11.5–15.5)
WBC: 4.8 10*3/uL (ref 4.0–10.5)
nRBC: 0 % (ref 0.0–0.2)

## 2022-07-27 LAB — COMPREHENSIVE METABOLIC PANEL
ALT: 18 U/L (ref 0–44)
AST: 29 U/L (ref 15–41)
Albumin: 3.6 g/dL (ref 3.5–5.0)
Alkaline Phosphatase: 84 U/L (ref 38–126)
Anion gap: 6 (ref 5–15)
BUN: 51 mg/dL — ABNORMAL HIGH (ref 6–20)
CO2: 24 mmol/L (ref 22–32)
Calcium: 8.6 mg/dL — ABNORMAL LOW (ref 8.9–10.3)
Chloride: 107 mmol/L (ref 98–111)
Creatinine, Ser: 1.57 mg/dL — ABNORMAL HIGH (ref 0.44–1.00)
GFR, Estimated: 45 mL/min — ABNORMAL LOW (ref >60)
Glucose, Bld: 90 mg/dL (ref 70–99)
Potassium: 4 mmol/L (ref 3.5–5.1)
Sodium: 137 mmol/L (ref 135–145)
Total Bilirubin: 0.5 mg/dL (ref 0.3–1.2)
Total Protein: 6.8 g/dL (ref 6.5–8.1)

## 2022-07-27 LAB — URINALYSIS, ROUTINE W REFLEX MICROSCOPIC
Bilirubin Urine: NEGATIVE
Glucose, UA: 50 mg/dL — AB
Ketones, ur: NEGATIVE mg/dL
Leukocytes,Ua: NEGATIVE
Nitrite: NEGATIVE
Protein, ur: 100 mg/dL — AB
Specific Gravity, Urine: 1.015 (ref 1.005–1.030)
pH: 5 (ref 5.0–8.0)

## 2022-07-27 LAB — TSH: TSH: 3.091 u[IU]/mL (ref 0.350–4.500)

## 2022-07-27 LAB — SALICYLATE LEVEL: Salicylate Lvl: <7 mg/dL — ABNORMAL LOW (ref 7.0–30.0)

## 2022-07-27 LAB — RESP PANEL BY RT-PCR (RSV, FLU A&B, COVID)  RVPGX2
Influenza A by PCR: NEGATIVE
Influenza B by PCR: NEGATIVE
Resp Syncytial Virus by PCR: NEGATIVE
SARS Coronavirus 2 by RT PCR: NEGATIVE

## 2022-07-27 LAB — URINE DRUG SCREEN, QUALITATIVE (ARMC ONLY)
Amphetamines, Ur Screen: NOT DETECTED
Barbiturates, Ur Screen: NOT DETECTED
Benzodiazepine, Ur Scrn: NOT DETECTED
Cannabinoid 50 Ng, Ur ~~LOC~~: NOT DETECTED
Cocaine Metabolite,Ur ~~LOC~~: NOT DETECTED
MDMA (Ecstasy)Ur Screen: NOT DETECTED
Methadone Scn, Ur: NOT DETECTED
Opiate, Ur Screen: NOT DETECTED
Phencyclidine (PCP) Ur S: NOT DETECTED
Tricyclic, Ur Screen: NOT DETECTED

## 2022-07-27 LAB — TROPONIN I (HIGH SENSITIVITY)
Troponin I (High Sensitivity): 6 ng/L (ref <18)
Troponin I (High Sensitivity): 7 ng/L (ref <18)

## 2022-07-27 LAB — PHOSPHORUS: Phosphorus: 3.8 mg/dL (ref 2.5–4.6)

## 2022-07-27 LAB — ETHANOL: Alcohol, Ethyl (B): <10 mg/dL (ref <10)

## 2022-07-27 LAB — CBG MONITORING, ED
Glucose-Capillary: 155 mg/dL — ABNORMAL HIGH (ref 70–99)
Glucose-Capillary: 71 mg/dL (ref 70–99)
Glucose-Capillary: 195 mg/dL — ABNORMAL HIGH (ref 70–99)

## 2022-07-27 LAB — HCG, QUANTITATIVE, PREGNANCY: hCG, Beta Chain, Quant, S: 2 m[IU]/mL (ref <5)

## 2022-07-27 LAB — MAGNESIUM: Magnesium: 1.9 mg/dL (ref 1.7–2.4)

## 2022-07-27 LAB — ACETAMINOPHEN LEVEL: Acetaminophen (Tylenol), Serum: <10 ug/mL — ABNORMAL LOW (ref 10–30)

## 2022-07-27 MED ORDER — FLUOXETINE HCL 10 MG PO CAPS
10.0000 mg | ORAL_CAPSULE | Freq: Every day | ORAL | Status: DC
  Administered 2022-07-28 – 2022-08-05 (×9): 10 mg via ORAL
  Filled 2022-07-27 (×9): qty 1

## 2022-07-27 MED ORDER — DEXTROSE 50 % IV SOLN: 1.0000 | INTRAVENOUS | Status: DC | PRN

## 2022-07-27 MED ORDER — GLUCERNA SHAKE PO LIQD
237.0000 mL | Freq: Three times a day (TID) | ORAL | Status: DC
  Administered 2022-07-28 – 2022-08-05 (×15): 237 mL via ORAL

## 2022-07-27 MED ORDER — HYDROCORTISONE 10 MG PO TABS
20.0000 mg | ORAL_TABLET | Freq: Every day | ORAL | Status: DC
  Administered 2022-07-28 – 2022-08-05 (×9): 20 mg via ORAL
  Filled 2022-07-27 (×10): qty 2

## 2022-07-27 MED ORDER — INSULIN ASPART 100 UNIT/ML IJ SOLN: 0.0000 [IU] | Freq: Three times a day (TID) | INTRAMUSCULAR | Status: DC

## 2022-07-27 MED ORDER — MAGNESIUM SULFATE IN D5W 1-5 GM/100ML-% IV SOLN
1.0000 g | Freq: Once | INTRAVENOUS | Status: AC
  Administered 2022-07-27: 1 g via INTRAVENOUS
  Filled 2022-07-27: qty 100

## 2022-07-27 MED ORDER — MELATONIN 5 MG PO TABS: 5.0000 mg | ORAL_TABLET | Freq: Every evening | ORAL | Status: DC | PRN

## 2022-07-27 MED ORDER — FERROUS SULFATE 325 (65 FE) MG PO TABS
325.0000 mg | ORAL_TABLET | Freq: Every day | ORAL | Status: DC
  Administered 2022-07-28 – 2022-08-05 (×9): 325 mg via ORAL
  Filled 2022-07-27 (×9): qty 1

## 2022-07-27 MED ORDER — HEPARIN SODIUM (PORCINE) 5000 UNIT/ML IJ SOLN
5000.0000 [IU] | Freq: Three times a day (TID) | INTRAMUSCULAR | Status: DC
  Filled 2022-07-27 (×4): qty 1

## 2022-07-27 MED ORDER — ACETAMINOPHEN 650 MG RE SUPP: 650.0000 mg | Freq: Four times a day (QID) | RECTAL | Status: AC | PRN

## 2022-07-27 MED ORDER — ONDANSETRON HCL 4 MG/2ML IJ SOLN: 4.0000 mg | Freq: Four times a day (QID) | INTRAMUSCULAR | Status: AC | PRN

## 2022-07-27 MED ORDER — IOHEXOL 350 MG/ML SOLN
75.0000 mL | Freq: Once | INTRAVENOUS | Status: AC | PRN
  Administered 2022-07-27: 75 mL via INTRAVENOUS

## 2022-07-27 MED ORDER — ONDANSETRON HCL 4 MG PO TABS
4.0000 mg | ORAL_TABLET | Freq: Four times a day (QID) | ORAL | Status: AC | PRN
  Administered 2022-07-28: 4 mg via ORAL
  Filled 2022-07-27: qty 1

## 2022-07-27 MED ORDER — INSULIN ASPART 100 UNIT/ML IJ SOLN: 0.0000 [IU] | Freq: Every day | INTRAMUSCULAR | Status: DC

## 2022-07-27 MED ORDER — HYDROCORTISONE 10 MG PO TABS: 10.0000 mg | ORAL_TABLET | ORAL | Status: DC

## 2022-07-27 MED ORDER — SENNOSIDES-DOCUSATE SODIUM 8.6-50 MG PO TABS: 1.0000 | ORAL_TABLET | Freq: Every evening | ORAL | Status: DC | PRN

## 2022-07-27 MED ORDER — ACETAMINOPHEN 325 MG PO TABS
650.0000 mg | ORAL_TABLET | Freq: Four times a day (QID) | ORAL | Status: AC | PRN
  Administered 2022-07-30: 650 mg via ORAL
  Filled 2022-07-27 (×2): qty 2

## 2022-07-27 MED ORDER — THIAMINE HCL 100 MG/ML IJ SOLN
100.0000 mg | Freq: Every day | INTRAMUSCULAR | Status: DC
  Administered 2022-07-27: 100 mg via INTRAVENOUS
  Filled 2022-07-27 (×2): qty 2

## 2022-07-27 MED ORDER — HYDRALAZINE HCL 20 MG/ML IJ SOLN: 5.0000 mg | Freq: Three times a day (TID) | INTRAMUSCULAR | Status: AC | PRN

## 2022-07-27 MED ORDER — ADULT MULTIVITAMIN W/MINERALS CH
1.0000 | ORAL_TABLET | Freq: Every day | ORAL | Status: DC
  Administered 2022-07-28 – 2022-08-05 (×9): 1 via ORAL
  Filled 2022-07-27 (×9): qty 1

## 2022-07-27 MED ORDER — LEVOTHYROXINE SODIUM 50 MCG PO TABS
50.0000 ug | ORAL_TABLET | Freq: Every day | ORAL | Status: DC
  Administered 2022-07-28 – 2022-08-05 (×9): 50 ug via ORAL
  Filled 2022-07-27 (×9): qty 1

## 2022-07-27 MED ORDER — AMLODIPINE BESYLATE 5 MG PO TABS
5.0000 mg | ORAL_TABLET | Freq: Every day | ORAL | Status: DC
  Administered 2022-07-28 – 2022-08-05 (×8): 5 mg via ORAL
  Filled 2022-07-27 (×8): qty 1

## 2022-07-27 MED ORDER — HYDROCORTISONE 10 MG PO TABS
10.0000 mg | ORAL_TABLET | Freq: Every day | ORAL | Status: DC
  Administered 2022-07-28 – 2022-08-04 (×8): 10 mg via ORAL
  Filled 2022-07-27 (×11): qty 1

## 2022-07-27 MED ORDER — SODIUM CHLORIDE 0.9 % IV SOLN: INTRAVENOUS | Status: DC

## 2022-07-27 MED ORDER — ACETAMINOPHEN 500 MG PO TABS
1000.0000 mg | ORAL_TABLET | Freq: Once | ORAL | Status: AC
  Administered 2022-07-27: 1000 mg via ORAL
  Filled 2022-07-27: qty 2

## 2022-07-27 NOTE — Assessment & Plan Note (Signed)
-   Hemoglobin is 8.4, her baseline is 9.0-9.1 over the last 3 weeks - No indication for blood transfusion at this time and patient denies any clinical signs of blood loss - Check anemia panel ordered

## 2022-07-27 NOTE — H&P (Addendum)
History and Physical   Ashley Camacho [id number] DOB: 03-14-1991 DOA: 07/27/2022  PCP: Pcp, No  Patient coming from: Home via EMS  I have personally briefly reviewed patient's old medical records in [place].  Chief Concern: Hyperglycemia  HPI: [name] is a 32 year old female with history of hypertension, insulin-dependent diabetes mellitus, hypothyroid, history of hypertension, CKD stage IIIb, adrenal insufficiency, hypothyroid, major depressive disorder, presence of jejunostomy tube, protein calorie malnutrition, who presents emergency department for chief concerns of hypoglycemia.  Vitals in the ED showed temperature 93.7, respiration rate of 13, heart rate 76, blood pressure 126/98, SpO2 100% room air.  Serum sodium is 137, potassium 4.0, chloride 107, bicarb 24, BUN 51, serum creatinine 1.57, EGFR 45, nonfasting blood glucose 98, WBC 4.8, hemoglobin 8.4, platelets of 176.  High sensitive troponin is 6 and on repeat was 6. UA was negative for nitrates and leukocytes.  UDS was negative.  ED treatment: Acetaminophen 1000 mg p.o. one-time dose, magnesium 1 g IV one-time dose. ----------------------------- At bedside, she is able to tell me her name, age, current year, current location.   She comes in for low sugar. She reports she did not take her insulin today. She last used insulin 07/26/22 evening. She states her blood glucose was 264 Monday night. She takes lantus 4 units bid.   She denies chest pain, shortness, dysuria. She endorses loose bowel movement, two days ago, brown.  She denies fever, cough, nausea. She endorses vomiting yesterday, it was light brown color.   She reports decent appetite. She reports her eldest daughter, 53 years old, has had a bad cough recently.   Patient denies cough. She endorses feeling fatigued and more tired recently. She denies SI, HI  Social history: She lives with her kids and their father. She denies tobacco, etoh, and  recreational drug use. She is a stay at home mom.   ROS: Constitutional: no weight change, no fever ENT/Mouth: no sore throat, no rhinorrhea Eyes: no eye pain, no vision changes Cardiovascular: no chest pain, no dyspnea,  no edema, no palpitations Respiratory: no cough, no sputum, no wheezing Gastrointestinal: no nausea, no vomiting, no diarrhea, no constipation Genitourinary: no urinary incontinence, no dysuria, no hematuria Musculoskeletal: no arthralgias, no myalgias Skin: no skin lesions, no pruritus, Neuro: + weakness, no loss of consciousness, no syncope, + fatigue Psych: no anxiety, no depression, + decrease appetite Heme/Lymph: no bruising, no bleeding  ED Course: Discussed with emergency medicine provider, patient requiring hospitalization for chief concerns of hypoglycemia.  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[place])   Essential hypertension   Insomnia   Hypothermia   Assessment and Plan:  * Hypoglycemia - With hypothermia, will check TSH-D50 IV as needed for low blood sugar, 4 days ordered - Check serum B12, B1, phosphorus, TSH - Thiamine 100 mg IV daily, 3 doses ordered  Hypoglycemia due to type 1 diabetes mellitus ([place]) - Holding home insulin dosing at this time - Insulin SSI with agents coverage, renal dosing ordered  Anemia of chronic disease - Hemoglobin is 8.4, her baseline is 9.0-9.1 over the last 3 weeks - No indication for blood  transfusion at this time and patient denies any clinical signs of blood loss - Check anemia panel ordered  Acute kidney injury superimposed on chronic kidney disease (HCC) - NS 125 ml/hr, 1 day  ordered - BMP in the AM  Adrenal insufficiency (HCC) - Resumed home Solu-Cortef 10-20 mg per home dosing 20 mg in the morning, 10 mg at night  Hypothyroidism - Levothyroxine 50 mcg daily before breakfast resumed  Hypothermia - Check TSH  Insomnia - Melatonin 5 mg nightly as needed  Essential hypertension - Amlodipine 5 mg daily resumed - Hydralazine 5 mg IV every 8 hours as needed for SBP greater 170, 5 days ordered  Anxiety - Resumed home fluoxetine 10 mg daily  Prolonged QT-avoid scheduled QT prolonging agents - Holding home mirtazapine  Chart reviewed.   DVT prophylaxis: Heparin Code Status: Full code Diet: Heart healthy/carb modified Family Communication: None Disposition Plan: pending clinical course Consults called: Dietary Admission status: Telemetry medical, observation  Past Medical History:  Diagnosis Date   Acute metabolic encephalopathy 0000000   Adrenal insufficiency ([place])    Anemia    Gastroparesis    Hypertension    Type 1 diabetes ([place])    Past Surgical History:  Procedure Laterality Date   BIOPSY  01/14/2021   Procedure: BIOPSY;  Surgeon: Thornton Park, MD;  Location: [place];  Service: Gastroenterology;;   CESAREAN SECTION     CESAREAN SECTION WITH BILATERAL TUBAL LIGATION N/A 02/17/2021   Procedure: CESAREAN SECTION WITH BILATERAL TUBAL LIGATION;  Surgeon: Truett Mainland, DO;  Location: [facility];  Service: Obstetrics;  Laterality: N/A;   ESOPHAGOGASTRODUODENOSCOPY (EGD) WITH PROPOFOL N/A 01/14/2021   Procedure: ESOPHAGOGASTRODUODENOSCOPY (EGD) WITH PROPOFOL;  Surgeon: Thornton Park, MD;  Location: [facility];  Service: Gastroenterology;  Laterality: N/A;   JEJUNOSTOMY N/A 06/13/2022   Procedure: JEJUNOSTOMY;  Surgeon: Jules Husbands, MD;  Location: [facility];  Service: General;  Laterality: N/A;   MOUTH SURGERY     Social History:  reports that she has never smoked. She has never been exposed to tobacco smoke. She has never  used smokeless tobacco. She reports that she does not drink alcohol and does not use drugs.  No Known Allergies Family History  Problem Relation Age of Onset   Diabetes type I Father    CAD Father    CAD Paternal 45    CAD Paternal Grandfather    Breast cancer Mother    Ovarian cancer Neg Hx    Family history: Family history reviewed and not pertinent  Prior to Admission medications   Medication Sig Start Date End Date Taking? Authorizing Provider  acetaminophen (TYLENOL) 500 MG tablet Take 2 tablets (1,000 mg total) by mouth every 6 (six) hours as needed for headache, fever or moderate pain. 03/04/21   Constant, Peggy, MD  amLODipine (NORVASC) 5 MG tablet Take 1 tablet (5 mg total) by mouth daily. 07/10/22 08/09/22  Max Sane, MD  feeding supplement, GLUCERNA SHAKE, (GLUCERNA SHAKE) LIQD Take 237 mLs by mouth 3 (three) times daily between meals. 07/09/22 08/08/22  Max Sane, MD  ferrous sulfate 325 (65 FE) MG tablet Take 1 tablet (325 mg total) by mouth daily with breakfast. 07/10/22 08/09/22  Max Sane, MD  FLUoxetine (PROZAC) 10 MG capsule Take 1 capsule (10 mg total) by mouth daily. 07/10/22 08/09/22  Max Sane, MD  hydrocortisone (CORTEF) 10 MG tablet Take 1-2 tablets (10-20 mg total) by mouth daily. Take 2 tablets (20 mg) in the morning and 1 tablet (  10 mg) in the evening. 07/09/22 08/08/22  Max Sane, MD  insulin aspart (NOVOLOG FLEXPEN) 100 UNIT/ML FlexPen Inject 2 units into the skin 3 (three) times daily with meals. In addition, inject 0-9 Units into the skin 3 (three) times daily with meals based on your blood sugar. Also inject 0-5 units into the skin every night at bedtime based on your blood sugar. See attached directions. 07/09/22   Max Sane, MD  insulin aspart (NOVOLOG) 100 UNIT/ML injection Inject 2 Units into the skin 3 (three) times daily with meals. 07/09/22 08/08/22  Max Sane, MD  insulin aspart (NOVOLOG) 100 UNIT/ML injection Inject 0-5 Units into the skin at  bedtime. 07/09/22 08/08/22  Max Sane, MD  Insulin Glargine [facility] KWIKPEN) 100 UNIT/ML Inject 4 Units into the skin 2 (two) times daily. 07/09/22 01/13/23  Max Sane, MD  levothyroxine (SYNTHROID) 50 MCG tablet Take 1 tablet (50 mcg total) by mouth daily at 6 (six) AM. 07/10/22 08/09/22  Max Sane, MD  melatonin 5 MG TABS Take 1 tablet (5 mg total) by mouth at bedtime as needed. 07/09/22 08/08/22  Max Sane, MD  mirtazapine (REMERON) 15 MG tablet Take 1 tablet (15 mg total) by mouth at bedtime. 07/09/22 08/08/22  Max Sane, MD  Multiple Vitamin (MULTIVITAMIN WITH MINERALS) TABS tablet Take 1 tablet by mouth daily. 07/10/22 08/09/22  Max Sane, MD  promethazine (PHENERGAN) 25 MG tablet Take 0.5 tablets (12.5 mg total) by mouth every 6 (six) hours as needed for nausea or vomiting. 07/09/22   Max Sane, MD   Physical Exam: Vitals:   07/27/22 1950 07/27/22 2000 07/27/22 2030 07/27/22 2042  BP: (!) 158/83 (!) 161/79 (!) 161/80   Pulse: 67 69 71   Resp: '14 12 16   '$ Temp:    (!) 97.4 F (36.3 C)  TempSrc:    Oral  SpO2: 100% 100% 100%    Constitutional: appears frail, malnourished, older than chronological age, NAD, calm, comfortable Eyes: PERRL, lids and conjunctivae normal ENMT: Mucous membranes are moist. Posterior pharynx clear of any exudate or lesions. Age-appropriate dentition. Hearing appropriate Neck: normal, supple, no masses, no thyromegaly Respiratory: clear to auscultation bilaterally, no wheezing, no crackles. Normal respiratory effort. No accessory muscle use.  Cardiovascular: Regular rate and rhythm, no murmurs / rubs / gallops. No extremity edema. 2+ pedal pulses. No carotid bruits.  Abdomen: no tenderness, no masses palpated, no hepatosplenomegaly. Bowel sounds positive. G tube Musculoskeletal: no clubbing / cyanosis. No joint deformity upper and lower extremities. Good ROM, no contractures, no atrophy. Normal muscle tone.  Skin: no rashes, lesions, ulcers. No  induration Neurologic: Sensation intact. Strength 5/5 in all 4.  Psychiatric: Normal judgment and insight. Alert and oriented x 3. Normal mood.   EKG: independently reviewed, showing sinus rhythm with rate of 74, QTc 518  Chest x-ray on Admission: I personally reviewed and I agree with radiologist reading as below.  CT ANGIO HEAD NECK W WO CM  Result Date: 07/27/2022 CLINICAL DATA:  Collapsed earlier today. EXAM: CT ANGIOGRAPHY HEAD AND NECK TECHNIQUE: Multidetector CT imaging of the head and neck was performed using the standard protocol during bolus administration of intravenous contrast. Multiplanar CT image reconstructions and MIPs were obtained to evaluate the vascular anatomy. Carotid stenosis measurements (when applicable) are obtained utilizing NASCET criteria, using the distal internal carotid diameter as the denominator. RADIATION DOSE REDUCTION: This exam was performed according to the departmental dose-optimization program which includes automated exposure control, adjustment of the mA and/or kV according  to patient size and/or use of iterative reconstruction technique. CONTRAST:  36m OMNIPAQUE IOHEXOL 350 MG/ML SOLN COMPARISON:  Same-day noncontrast CT head FINDINGS: CTA NECK FINDINGS Aortic arch: The imaged aortic arch is normal. The origins of the major branch vessels are patent. The subclavian arteries are patent to the level imaged. Right carotid system: The right common, internal, and external carotid arteries are patent, without stenosis or occlusion. There is no evidence of dissection or aneurysm. Left carotid system: The left common, internal, and external carotid arteries are patent, without stenosis or occlusion. There is no evidence of dissection or aneurysm. Vertebral arteries: The vertebral arteries are patent, without hemodynamically significant stenosis or occlusion. There is no evidence of dissection or aneurysm. Skeleton: There is no acute osseous abnormality or suspicious  osseous lesion. There is no visible canal hematoma. Other neck: Soft tissues of the neck are unremarkable. Upper chest: The imaged lung apices are clear. Review of the MIP images confirms the above findings CTA HEAD FINDINGS Anterior circulation: The intracranial ICAs are normal. The bilateral MCAs are normal. The bilateral ACAs are normal. The anterior communicating artery is normal There is no aneurysm or AVM. Posterior circulation: The bilateral V4 segments are normal. There is fenestration in the proximal basilar artery, a normal variant. The basilar artery is otherwise normal. The major cerebellar arteries appear normal. The bilateral PCAs are normal. Bilateral posterior communicating arteries are identified. There is no aneurysm or AVM. Venous sinuses: Patent. Anatomic variants: As above. Review of the MIP images confirms the above findings IMPRESSION: Normal vasculature of the head and neck. Electronically Signed   By: PValetta MoleM.D.   On: 07/27/2022 19:24   DG Chest Portable 1 View  Result Date: 07/27/2022 CLINICAL DATA:  Syncope. EXAM: PORTABLE CHEST 1 VIEW COMPARISON:  AP chest 06/09/2022 FINDINGS: Cardiac silhouette and mediastinal contours are within normal limits. The lungs are clear. No pleural effusion or pneumothorax. No acute skeletal abnormality. IMPRESSION: No active disease. Electronically Signed   By: RYvonne KendallM.D.   On: 07/27/2022 17:01   CT HEAD WO CONTRAST (5MM)  Result Date: 07/27/2022 CLINICAL DATA:  Head trauma, abnormal mental status (Age 497-64y; Ataxia, cervical trauma EXAM: CT HEAD WITHOUT CONTRAST CT CERVICAL SPINE WITHOUT CONTRAST TECHNIQUE: Multidetector CT imaging of the head and cervical spine was performed following the standard protocol without intravenous contrast. Multiplanar CT image reconstructions of the cervical spine were also generated. RADIATION DOSE REDUCTION: This exam was performed according to the departmental dose-optimization program which includes  automated exposure control, adjustment of the mA and/or kV according to patient size and/or use of iterative reconstruction technique. COMPARISON:  None Available. FINDINGS: CT HEAD FINDINGS Brain: No evidence of acute infarction, hemorrhage, hydrocephalus, extra-axial collection or mass lesion/mass effect. Vascular: No hyperdense vessel identified. Skull: No acute fracture. Sinuses/Orbits: Clear sinuses.  No acute orbital findings. Other: No mastoid effusions. CT CERVICAL SPINE FINDINGS Alignment: Straightening.  No substantial sagittal subluxation. Skull base and vertebrae: Vertebral body heights are maintained. No evidence of acute fracture. Soft tissues and spinal canal: No prevertebral fluid or swelling. No visible canal hematoma. Disc levels:  No significant focal bony degenerative change. Upper chest: Visualized lung apices are clear. IMPRESSION: No acute intracranial or cervical spine findings. Electronically Signed   By: FMargaretha SheffieldM.D.   On: 07/27/2022 16:56   CT Cervical Spine Wo Contrast  Result Date: 07/27/2022 CLINICAL DATA:  Head trauma, abnormal mental status (Age 457-64y; Ataxia, cervical trauma EXAM: CT HEAD WITHOUT CONTRAST  CT CERVICAL SPINE WITHOUT CONTRAST TECHNIQUE: Multidetector CT imaging of the head and cervical spine was performed following the standard protocol without intravenous contrast. Multiplanar CT image reconstructions of the cervical spine were also generated. RADIATION DOSE REDUCTION: This exam was performed according to the departmental dose-optimization program which includes automated exposure control, adjustment of the mA and/or kV according to patient size and/or use of iterative reconstruction technique. COMPARISON:  None Available. FINDINGS: CT HEAD FINDINGS Brain: No evidence of acute infarction, hemorrhage, hydrocephalus, extra-axial collection or mass lesion/mass effect. Vascular: No hyperdense vessel identified. Skull: No acute fracture. Sinuses/Orbits: Clear  sinuses.  No acute orbital findings. Other: No mastoid effusions. CT CERVICAL SPINE FINDINGS Alignment: Straightening.  No substantial sagittal subluxation. Skull base and vertebrae: Vertebral body heights are maintained. No evidence of acute fracture. Soft tissues and spinal canal: No prevertebral fluid or swelling. No visible canal hematoma. Disc levels:  No significant focal bony degenerative change. Upper chest: Visualized lung apices are clear. IMPRESSION: No acute intracranial or cervical spine findings. Electronically Signed   By: Margaretha Sheffield M.D.   On: 07/27/2022 16:56    Labs on Admission: I have personally reviewed following labs  CBC: Recent Labs  Lab 07/27/22 1733  WBC 4.8  NEUTROABS 3.0  HGB 8.4*  HCT 24.4*  MCV 87.8  PLT 0000000   Basic Metabolic Panel: Recent Labs  Lab 07/27/22 1733 07/27/22 2214  NA 137  --   K 4.0  --   CL 107  --   CO2 24  --   GLUCOSE 90  --   BUN 51*  --   CREATININE 1.57*  --   CALCIUM 8.6*  --   MG 1.9  --   PHOS  --  3.8   GFR: Estimated Creatinine Clearance: 35.6 mL/min (A) (by C-G formula based on SCr of 1.57 mg/dL (H)).  Liver Function Tests: Recent Labs  Lab 07/27/22 1733  AST 29  ALT 18  ALKPHOS 84  BILITOT 0.5  PROT 6.8  ALBUMIN 3.6   CBG: Recent Labs  Lab 07/27/22 1621 07/27/22 1825 07/27/22 2100  GLUCAP 155* 71 195*   Urine analysis:    Component Value Date/Time   COLORURINE YELLOW (A) 07/27/2022 1825   APPEARANCEUR CLEAR (A) 07/27/2022 1825   APPEARANCEUR Clear 09/18/2020 1153   LABSPEC 1.015 07/27/2022 1825   LABSPEC 1.026 03/01/2013 1844   PHURINE 5.0 07/27/2022 1825   GLUCOSEU 50 (A) 07/27/2022 1825   GLUCOSEU >=500 03/01/2013 1844   HGBUR SMALL (A) 07/27/2022 1825   BILIRUBINUR NEGATIVE 07/27/2022 1825   BILIRUBINUR neg 10/23/2020 0953   BILIRUBINUR Negative 09/18/2020 1153   BILIRUBINUR Negative 03/01/2013 1844   KETONESUR NEGATIVE 07/27/2022 1825   PROTEINUR 100 (A) 07/27/2022 1825    UROBILINOGEN 0.2 10/23/2020 0953   NITRITE NEGATIVE 07/27/2022 1825   LEUKOCYTESUR NEGATIVE 07/27/2022 1825   LEUKOCYTESUR Negative 03/01/2013 1844   This document was prepared using Dragon Voice Recognition software and may include unintentional dictation errors.  Dr. Tobie Poet [facility]  If 7PM-7AM, please contact overnight-coverage provider If 7AM-7PM, please contact day coverage provider [email or web address]  07/27/2022, 11:27 PM

## 2022-07-27 NOTE — Assessment & Plan Note (Signed)
-   NS 125 ml/hr, 1 day ordered - BMP in the AM

## 2022-07-27 NOTE — Assessment & Plan Note (Addendum)
-   With hypothermia, will check TSH-D50 IV as needed for low blood sugar, 4 days ordered - Check serum B12, B1, phosphorus, TSH - Thiamine 100 mg IV daily, 3 doses ordered

## 2022-07-27 NOTE — Assessment & Plan Note (Addendum)
-   Amlodipine 5 mg daily resumed - Hydralazine 5 mg IV every 8 hours as needed for SBP greater 170, 5 days ordered

## 2022-07-27 NOTE — Assessment & Plan Note (Addendum)
Check TSH 

## 2022-07-27 NOTE — Assessment & Plan Note (Signed)
-   Resumed home Solu-Cortef 10-20 mg per home dosing 20 mg in the morning, 10 mg at night

## 2022-07-27 NOTE — ED Triage Notes (Signed)
Pt arrived via [facility] with c/o hypoglycemia. Upon EMS arrival BGC was 34. Pt was in hospital for a few weeks due to not eating and received a feeding tube. Pt was discharged a week ago and had "doing better" per pt's family. Per EMS daughter reported pt had gone to take a nap earlier today and was her baseline, pt woke up from nap and wanted something to eat, pt collapsed. On arrival to [facility] pt's BGC was 180, and other VSS. Pt not responding to MD's questions. Pt has hx of DM.

## 2022-07-27 NOTE — ED Provider Notes (Signed)
[facility] Provider Note    Event Date/Time   First MD Initiated Contact with Patient 07/27/22 1618     (approximate)   History   Hypoglycemia (34 PTA)   HPI  Ashley Camacho is a 32 y.o. female type I diabetic who comes in with concerns for low blood sugar.  On review of records patient was seen by surgery yesterday had a jejunostomy tube placed.  Patient reportedly had tube placed due to failure to eat in the past.  There was a concern that patient went to take a nap at 10 AM and then she went up to wake up and get a snack and she went out.  They are concerned that she did hit her head but no obvious bump felt.  Patient was significantly altered with EMS sugars were in the 30s and patient was given some D10 and sugars have increased but patient is still altered.  Patient is able to move all extremities but has been nonverbal so unable to get full history.  She is able to nod yes or no denies any alcohol or drugs chest pain shortness of breath abdominal pain or any other concerns. Physical Exam   Triage Vital Signs: ED Triage Vitals  Enc Vitals Group     BP      Pulse      Resp      Temp      Temp src      SpO2      Weight      Height      Head Circumference      Peak Flow      Pain Score      Pain Loc      Pain Edu?      Excl. in [place]?     Most recent vital signs: Vitals:   07/27/22 1626  BP: (!) 124/98  Pulse: 76  Resp: 13  Temp: (!) 93.7 F (34.3 C)  SpO2: 100%     General: Awake, eyes are open patient follows basic commands.  But she has been nonverbal.  She is able to nod yes or no. CV:  Good peripheral perfusion.  Resp:  Normal effort.  Abd:  No distention.  Abdomen is soft nontender tube noted in place Other:  Patient is moving her extremities slightly all throughout but has noted nonverbal.  Pupils are equal round and reactive.   ED Results / Procedures / Treatments   Labs (all labs ordered are listed, but only abnormal  results are displayed) Labs Reviewed  CBG MONITORING, ED - Abnormal; Notable for the following components:      Result Value   Glucose-Capillary 155 (*)    All other components within normal limits     EKG  My interpretation of EKG:   Normal sinus rate of 73 without any ST elevation or T wave versions, normal intervals, QTc 510  Repeat EKG is sinus rate of 74 without any ST elevation or T wave inversions but QTc remains prolonged at 518  RADIOLOGY I have reviewed the CT had personally interpreted no evidence of intercranial hemorrhage.  PROCEDURES:  Critical Care performed: No  .1-3 Lead EKG Interpretation  Performed by: Vanessa Winslow, MD Authorized by: Vanessa San German, MD     Interpretation: normal     ECG rate:  70   ECG rate assessment: normal     Rhythm: sinus rhythm     Ectopy: none  Conduction: normal      MEDICATIONS ORDERED IN ED: Medications - No data to display   IMPRESSION / MDM / [place] / ED COURSE  I reviewed the triage vital signs and the nursing notes.   Patient's presentation is most consistent with acute presentation with potential threat to life or bodily function.   His last known normal was 10 AM before she went to take a nap.  Patient is out of the window for a stroke code.  Patient is still altered and nonverbal but moving all extremities at this time no obvious evidence of LVO.  Patient's hypoglycemia has been corrected but still seems slightly altered.  Will get workup for altered mental status  CT head negative Ct cervical negative Chest xray negative  5:41 PM reevaluated patient stroke scale is 0.  Patient is now more awake and alert answering all questions.  She states that she remembers getting up to get some food but she does remember anything after that.  Denies any syncopal episodes from low sugars in the past denies any seizure episodes previously.  She reports he has intermittent headaches that have been going on for  the past 2 weeks that are very severe in nature denies prior history of headaches.  Troponins are negative x 2.  Urine is reassuring.  UDS reassuring.  COVID, flu are negative hCG negative hemoglobin slightly low but similar to her prior of 9.  Kidney function is around her baseline.  The syncopal episode episode with amnesia I think most likely this was caused by hypoglycemia but she does have a new prolonged QTc. Her potassium is normal at 4.0 but her magnesium is 1.9.  We could try to replete this a little bit.  Reevaluated patient discussed about admission versus going home patient felt more comfortable with admission given her prolonged QTc, amnesia of the events, syncopal episode.  Discussed that is probably most likely related to the hypoglycemia but given this abnormal EKG I think some cardiac monitoring overnight is reasonable especially given her history of CHF.  The patient is on the cardiac monitor to evaluate for evidence of arrhythmia and/or significant heart rate changes.      FINAL CLINICAL IMPRESSION(S) / ED DIAGNOSES   Final diagnoses:  Syncope and collapse  Hypoglycemia  Prolonged QT interval     Rx / DC Orders   ED Discharge Orders     None        Note:  This document was prepared using Dragon voice recognition software and may include unintentional dictation errors.   [name], MD 07/27/22 [phone]

## 2022-07-27 NOTE — Assessment & Plan Note (Signed)
-   Resumed home fluoxetine 10 mg daily

## 2022-07-27 NOTE — Assessment & Plan Note (Signed)
-   Melatonin 5 mg nightly as needed

## 2022-07-27 NOTE — Hospital Course (Addendum)
[name] is a 32 year old female with history of hypertension, insulin-dependent diabetes mellitus, hypothyroid, history of hypertension, CKD stage IIIb, adrenal insufficiency, hypothyroid, major depressive disorder, presence of jejunostomy tube, protein calorie malnutrition, who presents emergency department from home via EMS on 07/27/2022 for chief concerns of hypoglycemia. Jejunostomy tube placed 03/04, reportedly had tube placed due to failure to eat. There was a concern that patient went to take a nap at 10 AM and then she went up to wake up and get a snack and she passed out. Family were concerned that she did hit her head but no obvious trauma noted Patient was significantly altered per EMS sugars were in the 30s and patient was given some D10 and sugars increased but patient is was altered on arrival to ED. She reports she did not take her insulin on date of arrival to ED. She last used insulin 07/26/22 evening. She states her blood glucose was 264 that night.  03/05: admitted to hospitalist service late evening 03/06-03/07: hypoglycemic in AM and hyperglycemic in afternoon, transitioned to q4h CBG checks, dietary following. Restarting tube feeds qhs and basal insulin daily vs home bid dose. AKI treating w/ fluids.  03/07: hyperkalemic, can't safely give lasix d/t renal fxn, can't safely give albuterol d/t prolonged QT hx, can't safeyl give kayexalate w/ bowel dysfunction - ordered another insulin + dextrose and bolus NS, renal fxn improving somewhat. K improving into the afternoon.  03/08: overnight hyperglycemic  up to 752 and K increasing to 6.1, not responding to sq insulin, stopped tube feeds and sent to stepdown for insulin gtt. Lipase slight elevated, Osm high. VBG no acidosis, started on drip then significant hypoglycemia to 37. Stopped gtt now checking Glc hourly.  03/09: Glc more stable overnight. D/c tube feeds and trial po intake daytime. Slow up-titration of insulin to avoid  hypoglycemia. Consider higher dose semglee this evening and if stays on tube feeds overnight hopefully this will prevent hypoglycemia in early am / overnight. Glc 463 at 19:30 03/10:  dipping a bit overnight (lowest 74 approx 04:00) then up again toward morning (209 at 06:30). Goal today: insulin at mealtimes only but will still monitor serum Glc along w/ K q4h blood draw, CBG and insulin ac. 03/11: increased basal insulin to Semglee 4 units bid, keep Novolog 4 units ac and add sliding scale if she eats >50% of her meals. HS dose as well per protocol. Tube feeds overnight at 99mL/h glucerna. TOC/dietary to arrange CBG monitor and sensor. TOC following for DME.   03/12:  Got Dexcom CBG monitor w/ extra sensors and w/ reading device - supplies should last about 40 days (DM educator is Felecia Shelling).  Changing Semglee 4 units bid to Levemir 4 units AM, 6 units PM.  Adjusting tube feeds to 8 h qhs which she will be able to replicate at home, will try 100 mL/h x8h to cover 70% of her calorie needs (she has 1yo child w/ erratic sleep schedule so is not reasonably able to do 12 hours tube feeds). Dietary is following (Dietician is Social research officer, [employer].)  Novolog 4 units w/ ALL meals, sand sliding scale on top of that if eats >50% of meals.  Sliding scale also w/ Novolog qhs.  To do:  make sure she has tube feed pump and knows how to use it - TOC working on this (SW is Aileen Pilot.), also getting home health set up.  I tried calling [facility] again to advocate for them taking  this patient back to their practice if possible - Dr Gabriel Carina and I spoke a few days ago and she recommended patient go to [facility] or [facility], pt had been d/c d/t no-shows - I think her no-shows were out of her control at the time due to family issues (she was relying on others for transportation), and currently she has other physicians at [facility] and she lives in [place] - she would have a lot of difficulty getting to [facility] or [facility] - I left message  for the practice administrator to call my cell.     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 to consult w/ endocrinology - she has been d/c from their clinic d/t no show appts, recommend [facility] follow up and pt is amenable to this.  D50 IV as needed for hypoglycemia Checking Glc q2-4h - remain in stepdown for close nursing care  Off gtt  Tube feeds as above  Adjust insulin as needed but ideally need to replicate what she can do at home    Hyperkalemia - waxes and wanes Likely intercellular shift d/t labile insulin  Caution w/ lasix d/t renal fxn, caution w/ albuterol d/t prolonged QT hx, caution w/ kayexalate d/t bowel dysfunction  additional insulin + dextrose and IV fluids as needed  Closely monitor K  Telemetry   Severe gastroparesis limiting po intake  Jejunostomy feeding tube in place Appreciate dietary recs Plan for patient to have tube feeds qhs and po intake during the day as able - she has been doing well w/ po during the day   Anemia of chronic disease Hemoglobin is 8.4, her baseline is 9.0-9.1 over the last 3 weeks No indication for blood transfusion at this time and patient denies any clinical signs of blood loss   Acute kidney injury superimposed on chronic kidney disease stage 3b ([place]) - AKI resolved has been stable CKD  Stop fluids  Follow BMP    Adrenal insufficiency ([place]) home  Solu-Cortef 10-20 mg per home dosing 20 mg in the morning, 10 mg at night   Chronic headache Prn meds as able - fioricet was not helpful, may consider nurtec or higher dose depakote though this doesn't sound like migraine headache. Will start depakote for headache prevention  Hypothyroidism TSH WNL 3.09 Levothyroxine 50 mcg daily   Insomnia Melatonin 5 mg nightly as needed   Essential hypertension Amlodipine 5 mg daily    Anxiety home fluoxetine 10 mg daily   Prolonged QT avoid scheduled QT prolonging agents Holding home mirtazapine     DVT prophylaxis: heparin - pt has declined  Pertinent IV fluids/nutrition: see above Central lines / invasive devices: jejunostomy tube   Code Status: FULL CODE   Current Admission Status: inpatient, stepdown - if ICU needs beds would be ok to transfer units - she is requiring frequent labs / med adjustments so will leave in SDU  for now.    TOC needs / Dispo plan: anticipate d/c to prevous home environment, will need tube feed supplies, Glc monitoring supplies, follow up w/ endocrine  Barriers to discharge / significant pending items: glc stability, dietary recs, titrating tube feeds, home health, endocrine f/u

## 2022-07-27 NOTE — Assessment & Plan Note (Signed)
-   Holding home insulin dosing at this time - Insulin SSI with agents coverage, renal dosing ordered

## 2022-07-27 NOTE — Assessment & Plan Note (Signed)
-   Levothyroxine 50 mcg daily before breakfast resumed 

## 2022-07-28 DIAGNOSIS — Z1152 Encounter for screening for COVID-19: Secondary | ICD-10-CM | POA: Diagnosis not present

## 2022-07-28 DIAGNOSIS — I4581 Long QT syndrome: Secondary | ICD-10-CM | POA: Diagnosis not present

## 2022-07-28 DIAGNOSIS — E1043 Type 1 diabetes mellitus with diabetic autonomic (poly)neuropathy: Secondary | ICD-10-CM | POA: Diagnosis not present

## 2022-07-28 DIAGNOSIS — G4700 Insomnia, unspecified: Secondary | ICD-10-CM | POA: Diagnosis present

## 2022-07-28 DIAGNOSIS — E11649 Type 2 diabetes mellitus with hypoglycemia without coma: Secondary | ICD-10-CM | POA: Diagnosis not present

## 2022-07-28 DIAGNOSIS — K3184 Gastroparesis: Secondary | ICD-10-CM | POA: Diagnosis not present

## 2022-07-28 DIAGNOSIS — N179 Acute kidney failure, unspecified: Secondary | ICD-10-CM | POA: Diagnosis not present

## 2022-07-28 DIAGNOSIS — S199XXA Unspecified injury of neck, initial encounter: Secondary | ICD-10-CM | POA: Diagnosis not present

## 2022-07-28 DIAGNOSIS — I129 Hypertensive chronic kidney disease with stage 1 through stage 4 chronic kidney disease, or unspecified chronic kidney disease: Secondary | ICD-10-CM | POA: Diagnosis not present

## 2022-07-28 DIAGNOSIS — E039 Hypothyroidism, unspecified: Secondary | ICD-10-CM | POA: Diagnosis not present

## 2022-07-28 DIAGNOSIS — Z7952 Long term (current) use of systemic steroids: Secondary | ICD-10-CM | POA: Diagnosis not present

## 2022-07-28 DIAGNOSIS — E871 Hypo-osmolality and hyponatremia: Secondary | ICD-10-CM | POA: Diagnosis not present

## 2022-07-28 DIAGNOSIS — R55 Syncope and collapse: Secondary | ICD-10-CM | POA: Diagnosis not present

## 2022-07-28 DIAGNOSIS — S0990XA Unspecified injury of head, initial encounter: Secondary | ICD-10-CM | POA: Diagnosis not present

## 2022-07-28 DIAGNOSIS — Z681 Body mass index (BMI) 19 or less, adult: Secondary | ICD-10-CM | POA: Diagnosis not present

## 2022-07-28 DIAGNOSIS — F329 Major depressive disorder, single episode, unspecified: Secondary | ICD-10-CM | POA: Diagnosis not present

## 2022-07-28 DIAGNOSIS — N1832 Chronic kidney disease, stage 3b: Secondary | ICD-10-CM | POA: Diagnosis not present

## 2022-07-28 DIAGNOSIS — E875 Hyperkalemia: Secondary | ICD-10-CM | POA: Diagnosis not present

## 2022-07-28 DIAGNOSIS — Z79899 Other long term (current) drug therapy: Secondary | ICD-10-CM | POA: Diagnosis not present

## 2022-07-28 DIAGNOSIS — Z794 Long term (current) use of insulin: Secondary | ICD-10-CM | POA: Diagnosis not present

## 2022-07-28 DIAGNOSIS — E1065 Type 1 diabetes mellitus with hyperglycemia: Secondary | ICD-10-CM | POA: Diagnosis present

## 2022-07-28 DIAGNOSIS — E10649 Type 1 diabetes mellitus with hypoglycemia without coma: Secondary | ICD-10-CM | POA: Diagnosis not present

## 2022-07-28 DIAGNOSIS — R636 Underweight: Secondary | ICD-10-CM | POA: Diagnosis present

## 2022-07-28 DIAGNOSIS — Z934 Other artificial openings of gastrointestinal tract status: Secondary | ICD-10-CM | POA: Diagnosis not present

## 2022-07-28 DIAGNOSIS — E162 Hypoglycemia, unspecified: Secondary | ICD-10-CM | POA: Diagnosis not present

## 2022-07-28 DIAGNOSIS — D631 Anemia in chronic kidney disease: Secondary | ICD-10-CM | POA: Diagnosis not present

## 2022-07-28 DIAGNOSIS — E2740 Unspecified adrenocortical insufficiency: Secondary | ICD-10-CM | POA: Diagnosis not present

## 2022-07-28 DIAGNOSIS — F419 Anxiety disorder, unspecified: Secondary | ICD-10-CM | POA: Diagnosis present

## 2022-07-28 DIAGNOSIS — R519 Headache, unspecified: Secondary | ICD-10-CM | POA: Diagnosis not present

## 2022-07-28 DIAGNOSIS — E1022 Type 1 diabetes mellitus with diabetic chronic kidney disease: Secondary | ICD-10-CM | POA: Diagnosis not present

## 2022-07-28 LAB — GLUCOSE, CAPILLARY
Glucose-Capillary: 262 mg/dL — ABNORMAL HIGH (ref 70–99)
Glucose-Capillary: 40 mg/dL — CL (ref 70–99)
Glucose-Capillary: 57 mg/dL — ABNORMAL LOW (ref 70–99)
Glucose-Capillary: 573 mg/dL (ref 70–99)
Glucose-Capillary: 327 mg/dL — ABNORMAL HIGH (ref 70–99)
Glucose-Capillary: 64 mg/dL — ABNORMAL LOW (ref 70–99)
Glucose-Capillary: 69 mg/dL — ABNORMAL LOW (ref 70–99)
Glucose-Capillary: 439 mg/dL — ABNORMAL HIGH (ref 70–99)
Glucose-Capillary: 496 mg/dL — ABNORMAL HIGH (ref 70–99)

## 2022-07-28 LAB — BASIC METABOLIC PANEL
Anion gap: 7 (ref 5–15)
BUN: 49 mg/dL — ABNORMAL HIGH (ref 6–20)
CO2: 23 mmol/L (ref 22–32)
Calcium: 8.5 mg/dL — ABNORMAL LOW (ref 8.9–10.3)
Chloride: 107 mmol/L (ref 98–111)
Creatinine, Ser: 1.68 mg/dL — ABNORMAL HIGH (ref 0.44–1.00)
GFR, Estimated: 41 mL/min — ABNORMAL LOW (ref >60)
Glucose, Bld: 125 mg/dL — ABNORMAL HIGH (ref 70–99)
Potassium: 4.9 mmol/L (ref 3.5–5.1)
Sodium: 137 mmol/L (ref 135–145)

## 2022-07-28 LAB — RETICULOCYTES
Immature Retic Fract: 7.1 % (ref 2.3–15.9)
RBC.: 2.81 MIL/uL — ABNORMAL LOW (ref 3.87–5.11)
Retic Count, Absolute: 57.9 10*3/uL (ref 19.0–186.0)
Retic Ct Pct: 2.1 % (ref 0.4–3.1)

## 2022-07-28 LAB — FOLATE: Folate: 18.5 ng/mL (ref >5.9)

## 2022-07-28 LAB — IRON AND TIBC
Iron: 74 ug/dL (ref 28–170)
Saturation Ratios: 46 % — ABNORMAL HIGH (ref 10.4–31.8)
TIBC: 160 ug/dL — ABNORMAL LOW (ref 250–450)
UIBC: 86 ug/dL

## 2022-07-28 LAB — CBC
HCT: 24.6 % — ABNORMAL LOW (ref 36.0–46.0)
Hemoglobin: 8.7 g/dL — ABNORMAL LOW (ref 12.0–15.0)
MCH: 30.1 pg (ref 26.0–34.0)
MCHC: 35.4 g/dL (ref 30.0–36.0)
MCV: 85.1 fL (ref 80.0–100.0)
Platelets: 193 10*3/uL (ref 150–400)
RBC: 2.89 MIL/uL — ABNORMAL LOW (ref 3.87–5.11)
RDW: 13.6 % (ref 11.5–15.5)
WBC: 5.1 10*3/uL (ref 4.0–10.5)
nRBC: 0 % (ref 0.0–0.2)

## 2022-07-28 LAB — VITAMIN B12: Vitamin B-12: 623 pg/mL (ref 180–914)

## 2022-07-28 LAB — FERRITIN: Ferritin: 952 ng/mL — ABNORMAL HIGH (ref 11–307)

## 2022-07-28 MED ORDER — DEXTROSE 50 % IV SOLN
1.0000 | INTRAVENOUS | Status: DC | PRN
  Administered 2022-07-28: 50 mL via INTRAVENOUS
  Filled 2022-07-28: qty 50

## 2022-07-28 MED ORDER — OSMOLITE 1.2 CAL PO LIQD: 1000.0000 mL | ORAL | Status: DC

## 2022-07-28 MED ORDER — DEXTROSE 50 % IV SOLN
25.0000 mL | INTRAVENOUS | Status: AC | PRN
  Administered 2022-07-29: 25 mL via INTRAVENOUS
  Administered 2022-07-30: 50 mL via INTRAVENOUS
  Filled 2022-07-28 (×3): qty 50

## 2022-07-28 MED ORDER — INSULIN ASPART 100 UNIT/ML IJ SOLN
0.0000 [IU] | INTRAMUSCULAR | Status: DC
  Administered 2022-07-28 (×2): 9 [IU] via SUBCUTANEOUS
  Administered 2022-07-29: 2 [IU] via SUBCUTANEOUS
  Administered 2022-07-29: 9 [IU] via SUBCUTANEOUS
  Filled 2022-07-28 (×4): qty 1

## 2022-07-28 MED ORDER — OSMOLITE 1.5 CAL PO LIQD
780.0000 mL | ORAL | Status: DC
  Administered 2022-07-28 – 2022-07-29 (×2): 780 mL

## 2022-07-28 MED ORDER — DIPHENHYDRAMINE HCL 50 MG/ML IJ SOLN
12.5000 mg | Freq: Once | INTRAMUSCULAR | Status: AC
  Administered 2022-07-28: 12.5 mg via INTRAVENOUS
  Filled 2022-07-28: qty 1

## 2022-07-28 MED ORDER — THIAMINE MONONITRATE 100 MG PO TABS
100.0000 mg | ORAL_TABLET | Freq: Every day | ORAL | Status: AC
  Administered 2022-07-28 – 2022-07-29 (×2): 100 mg via ORAL
  Filled 2022-07-28 (×2): qty 1

## 2022-07-28 NOTE — Inpatient Diabetes Management (Signed)
Inpatient Diabetes Program Recommendations  AACE/ADA: New Consensus Statement on Inpatient Glycemic Control   Target Ranges:  Prepandial:   less than 140 mg/dL      Peak postprandial:   less than 180 mg/dL (1-2 hours)      Critically ill patients:  140 - 180 mg/dL    Latest Reference Range & Units 07/28/22 07:32 07/28/22 07:56 07/28/22 08:17  Glucose-Capillary 70 - 99 mg/dL 40 (LL) 57 (L) 262 (H)    Latest Reference Range & Units 07/27/22 16:21 07/27/22 18:25 07/27/22 21:00  Glucose-Capillary 70 - 99 mg/dL 155 (H) 71 195 (H)   Review of Glycemic Control  Diabetes history: DM1 (does NOT make any insulin; requires basal, correction, and carb coverage insulin; very sensitive to insulin) Outpatient Diabetes medications: Basaglar 4 units BID, Novolog 2 units TID with meals plus correction Current orders for Inpatient glycemic control: Novolog 0-9 units TID with meals, Novolog 0-5 units QHS; Cortef 20 mg QAM, Cortef 10 mg QPM  Inpatient Diabetes Program Recommendations:    Insulin: Please consider discontinuing Novolog 0-9 units TID and Novolog 0-5 units QHS and order Novolog 0-6 units Q4H. Once hypoglycemia has resolved completely and glucose is consistently over 180 mg/dl, consider ordering Semglee 4 units Q24H.  NOTE: Patient admitted with hypoglycemia, acute on chronic kidney injury, and anemia. Patient is well known to inpatient diabetes team due to frequent prolonged hospital admissions. Patient was last inpatient 06/09/22-07/09/22 and last seen by our team on 07/09/22. Patient has Type 1 DM and is very sensitive to insulin.   Thanks, Barnie Alderman, RN, MSN, [place] Diabetes Coordinator Inpatient Diabetes Program [phone] (Team Pager from 8am to [place])

## 2022-07-28 NOTE — Progress Notes (Signed)
   07/28/22 1200  Spiritual Encounters  Type of Visit Initial  Care provided to: Patient  Referral source Nurse (RN/NT/LPN)  Reason for visit Routine spiritual support  OnCall Visit No   Chaplain responded to nurse consult. Chaplain provided compassionate presence and reflective listening as patient shared health challenges. Chaplain also educated patient on advance directives and left paperwork if patient wanted to complete. Patient appreciated [place] visit.

## 2022-07-28 NOTE — Progress Notes (Signed)
Initial Nutrition Assessment  DOCUMENTATION CODES:   Underweight  INTERVENTION:   -Glucerna Shake po TID, each supplement provides 220 kcal and 10 grams of protein  -MVI with minerals daily -Liberalize diet to carb modified for wider variety of meal selections -Resume nocturnal TF per MD:  Initiate Osmolite 1.5 @ 65 ml/hr via j-tube over 12 hour period (2000-0800)  30 ml free water flush every 4 hours  Tube feeding regimen provides 1170 kcal (69% of needs), 49 grams of protein, and 593 ml of H2O.  Total free water: 773 ml daily  NUTRITION DIAGNOSIS:   Inadequate oral intake related to nausea, vomiting, poor appetite as evidenced by per patient/family report.  GOAL:   Patient will meet greater than or equal to 90% of their needs  MONITOR:   PO intake, Supplement acceptance, TF tolerance  REASON FOR ASSESSMENT:   Consult Assessment of nutrition requirement/status  ASSESSMENT:   Pt with history of hypertension, insulin-dependent diabetes mellitus, hypothyroid, history of hypertension, CKD stage IIIb, adrenal insufficiency, hypothyroid, major depressive disorder, presence of jejunostomy tube, protein calorie malnutrition, who presents for chief concerns of hypoglycemia.  Pt admitted with hypoglycemia.   Reviewed I/O's: +928 ml x 24 hours   Spoke with pt at bedside. Pt reports she was doing well up until about 5 days PTA. She reports having abdominal pain and decreased appetite. Her blood sugars have been "up and down- anything from 100 to HI". Per pt, CBGS are low overnight and in the morning and tend to increase at dinner time. Pt was consuming 4 meals per day. She was getting her groceries delivered from [place]. Typical meal intake includes Breakfast: oatmeal OR cereal OR french toast sticks or pancakes on weekends, Lunch: sandwich; Dinner: meat, starch, and vegetable OR pasta with shrimp and garlic bread. Pt also consumes an evening snack of nabs. She drinks mainly water.  Pt still has j-tube access, however, has not used since after admission.   Pt shares she ate 100% of oatmeal and 50% of pancake today. She feels nauseous, but has not vomited yet (per discussion with RN, pt vomited after RD visit). Pt has also been sipping on Glucerna shakes, which she drinks intermittently at home (RD provided pt with coupons to help defray cost).   Pt denies any difficulty obtaining medications or transportation to appointments. She denies being on any medications for gastroparesis, but has an outpatient appointment scheduled for 08/10/22.Per pt, she has been unable to use her CGM, as her phone is not compatible. She has found a new phone for use, but needs to replace the battery before using. Per pt, her mouth is sore, as she felt she bit the inside of her cheek she she passed out. RD encouraged pt to try softer textured foods to ease in chewing. Also discussed smaller, more frequent meals for gastroparesis.   Reviewed wt hx; pt wt has been stable over the past 5 months. Noted admit wt on last hospitalization on 06/10/22 was 36.9 kg. Pt reports she has lost about 10 pounds over the past 2 weeks. She shares she was 105# when she left the hospital last and is now 95#. Suspect edema may also be masking true weight loss as well as fat and muscle depletions.   Case discussed with RN and MD. Suggested possible GI consult. Per MD, plan to re-start nocturnal TF.   Medications reviewed and include ferrous sulfate and thiamine.   Lab Results  Component Value Date   HGBA1C 10.2 (H) 02/28/2022  PTA DM medications are 2 units insulin aspart TID with meals, 0-5 units insulin aspart at bedtime, 4 units insulin glargine BID, and 2 units insulin aspart TID.   Labs reviewed: CBGS: 57-573 (inpatient orders for glycemic control are 0-9 units insulin aspart every 4 hours).    NUTRITION - FOCUSED PHYSICAL EXAM:  Flowsheet Row Most Recent Value  Orbital Region No depletion  Upper Arm Region Mild  depletion  Thoracic and Lumbar Region No depletion  Buccal Region No depletion  Temple Region No depletion  Clavicle Bone Region Moderate depletion  Clavicle and Acromion Bone Region Mild depletion  Scapular Bone Region Mild depletion  Dorsal Hand Mild depletion  Patellar Region Mild depletion  Anterior Thigh Region Mild depletion  Posterior Calf Region Mild depletion  Edema (RD Assessment) Mild  Hair Reviewed  Eyes Reviewed  Mouth Reviewed  Skin Reviewed  Nails Reviewed       Diet Order:   Diet Order             Diet Carb Modified Fluid consistency: Thin  Diet effective now                   EDUCATION NEEDS:   Education needs have been addressed  Skin:  Skin Assessment: Reviewed RN Assessment  Last BM:  Unknown  Height:   Ht Readings from Last 1 Encounters:  07/26/22 '5\' 1"'$  (1.549 m)    Weight:   Wt Readings from Last 1 Encounters:  07/26/22 43.4 kg    Ideal Body Weight:  47.7 kg  BMI:  There is no height or weight on file to calculate BMI.  Estimated Nutritional Needs:   Kcal:  1700-1900  Protein:  85-100 grams  Fluid:  > 1.7 L    Loistine Chance, RD, LDN, [place] Registered Dietitian II Certified Diabetes Care and Education Specialist Please refer to [facility] for RD and/or RD on-call/weekend/after hours pager

## 2022-07-28 NOTE — Progress Notes (Signed)
Mobility Specialist - Progress Note   07/28/22 1329  Mobility  Activity Ambulated independently in hallway;Stood at bedside;Dangled on edge of bed  Level of Assistance Independent  Assistive Device None  Distance Ambulated (ft) 600 ft  Activity Response Tolerated well  Mobility Referral Yes  $Mobility charge 1 Mobility   Pt supine in bed on RA. Pt completes bed mobility, STS, and ambulates in hallway indep. Pt returns to EOB with needs in reach.   Gretchen Short  Mobility Specialist  07/28/22 1:30 PM

## 2022-07-28 NOTE — Progress Notes (Signed)
PROGRESS NOTE    Ashley Camacho   [id number] DOB: 05/15/1991  DOA: 07/27/2022 Date of Service: 07/28/22 PCP: Merryl Hacker, No     Brief Narrative / Hospital Course:  [name] is a 32 year old female with history of hypertension, insulin-dependent diabetes mellitus, hypothyroid, history of hypertension, CKD stage IIIb, adrenal insufficiency, hypothyroid, major depressive disorder, presence of jejunostomy tube, protein calorie malnutrition, who presents emergency department from home via EMS on 07/27/2022 for chief concerns of hypoglycemia. Jejunostomy tube placed 03/04, reportedly had tube placed due to failure to eat. There was a concern that patient went to take a nap at 10 AM and then she went up to wake up and get a snack and she passed out. Family were concerned that she did hit her head but no obvious trauma noted Patient was significantly altered per EMS sugars were in the 30s and patient was given some D10 and sugars increased but patient is was altered on arrival to ED. She reports she did not take her insulin on date of arrival to ED. She last used insulin 07/26/22 evening. She states her blood glucose was 264 that night.  03/05: admitted to hospitalist service late evening 03/06: hypoglycemic in AM and hyperglycemic this afternoon, transitioned to q4h CBG checks, dietary following.     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    Gastroparesis Feeding tube in place Appreciate dietary recs Ideally would like "backup plan" in place for patient to have tube feeds as needed / qhs - she stated qhs tube feeds w/ po intake during the day worked well for her in the past   Anemia of chronic disease Hemoglobin is 8.4, her baseline is 9.0-9.1 over the last 3 weeks No indication for blood transfusion at this time and patient denies any clinical signs of blood loss   Acute kidney injury superimposed on chronic kidney disease stage 3b (HCC) NS 125 ml/hr, 1 day ordered BMP in the AM   Adrenal insufficiency (HCC) home Solu-Cortef 10-20 mg per home dosing 20 mg in the morning, 10 mg at night   Hypothyroidism TSH WNL Levothyroxine 50 mcg daily before breakfast resumed   Insomnia Melatonin 5 mg nightly as needed   Essential hypertension Amlodipine 5 mg daily resumed Hydralazine 5 mg IV every 8 hours as needed for SBP greater 170, 5 days ordered   Anxiety Resumed home fluoxetine 10 mg daily   Prolonged QT avoid scheduled QT prolonging agents Holding home mirtazapine     DVT prophylaxis: heparin Pertinent IV fluids/nutrition: NS 125 mL/h Central lines / invasive devices: jejunostomy tube   Code Status: FULL CODE  Current Admission Status: observation   TOC needs / Dispo plan: anticipate d/t to prevous home environment  Barriers to discharge / significant pending items: glc stability, dietary recs, titrating tube feeds              Subjective / Brief ROS:  Patient reports nausea and vomiting Denies CP/SOB.  Pain controlled.  Denies new weakness.  Reports no concerns w/ urination/defecation.   Family Communication: none at  this time    Objective Findings:  Vitals:   07/28/22 0300 07/28/22 0357 07/28/22 0421 07/28/22 0735  BP: 91/66  100/60 133/73  Pulse: 67  80 81  Resp: '18  18 16  '$ Temp:  98.4 F (36.9 C) 97.8 F (36.6 C) 97.8 F (36.6 C)  TempSrc:  Oral Oral   SpO2: 98%  100% 100%    Intake/Output Summary (Last 24 hours) at 07/28/2022 1314 Last data filed at 07/28/2022 0610 Gross per 24 hour  Intake 927.94 ml  Output --  Net 927.94 ml   There were no vitals filed for this visit.  Examination:  Physical Exam Constitutional:      General: She is not in acute distress.    Appearance: Normal appearance. She is ill-appearing.  Cardiovascular:     Rate and Rhythm: Normal rate and regular rhythm.     Heart sounds: Normal heart sounds.  Pulmonary:     Effort: Pulmonary effort is normal.     Breath sounds: Normal breath sounds.  Abdominal:     General: Abdomen is flat. Bowel sounds are normal.     Palpations: Abdomen is soft.  Skin:    General: Skin is warm and dry.  Neurological:     General: No focal deficit present.     Mental Status: She is alert and oriented to person, place, and time.  Psychiatric:        Mood and Affect: Mood normal.        Behavior: Behavior normal.          Scheduled Medications:   amLODipine  5 mg Oral Daily   feeding supplement (GLUCERNA SHAKE)  237 mL Oral TID BM   ferrous sulfate  325 mg Oral Q breakfast   FLUoxetine  10 mg Oral Daily   heparin  5,000 Units Subcutaneous Q8H   hydrocortisone  20 mg Oral Q breakfast   And   hydrocortisone  10 mg Oral Q supper   insulin aspart  0-9 Units Subcutaneous Q4H   levothyroxine  50 mcg Oral Q0600   multivitamin with minerals  1 tablet Oral Daily   thiamine  100 mg Oral Daily    Continuous Infusions:  sodium chloride Stopped (07/28/22 0720)    PRN Medications:  acetaminophen **OR** acetaminophen, dextrose, hydrALAZINE, melatonin, ondansetron **OR** ondansetron (ZOFRAN) IV,  senna-docusate  Antimicrobials from admission:  Anti-infectives (From admission, onward)    None           Data Reviewed:  I have personally reviewed the following...  CBC: Recent Labs  Lab 07/27/22 1733 07/28/22 0447  WBC 4.8 5.1  NEUTROABS 3.0  --   HGB 8.4* 8.7*  HCT 24.4* 24.6*  MCV 87.8 85.1  PLT 178 0000000   Basic Metabolic Panel: Recent Labs  Lab 07/27/22 1733 07/27/22 2214 07/28/22 0447  NA 137  --  137  K 4.0  --  4.9  CL 107  --  107  CO2 24  --  23  GLUCOSE 90  --  125*  BUN  51*  --  49*  CREATININE 1.57*  --  1.68*  CALCIUM 8.6*  --  8.5*  MG 1.9  --   --   PHOS  --  3.8  --    GFR: Estimated Creatinine Clearance: 33.2 mL/min (A) (by C-G formula based on SCr of 1.68 mg/dL (H)). Liver Function Tests: Recent Labs  Lab 07/27/22 1733  AST 29  ALT 18  ALKPHOS 84  BILITOT 0.5  PROT 6.8  ALBUMIN 3.6   No results for input(s): "LIPASE", "AMYLASE" in the last 168 hours. No results for input(s): "AMMONIA" in the last 168 hours. Coagulation Profile: No results for input(s): "INR", "PROTIME" in the last 168 hours. Cardiac Enzymes: No results for input(s): "CKTOTAL", "CKMB", "CKMBINDEX", "TROPONINI" in the last 168 hours. BNP (last 3 results) No results for input(s): "PROBNP" in the last 8760 hours. HbA1C: No results for input(s): "HGBA1C" in the last 72 hours. CBG: Recent Labs  Lab 07/27/22 2100 07/28/22 0732 07/28/22 0756 07/28/22 0817 07/28/22 1153  GLUCAP 195* 40* 57* 262* 573*   Lipid Profile: No results for input(s): "CHOL", "HDL", "LDLCALC", "TRIG", "CHOLHDL", "LDLDIRECT" in the last 72 hours. Thyroid Function Tests: Recent Labs    07/27/22 2214  TSH 3.091   Anemia Panel: Recent Labs    07/27/22 1734 07/27/22 1947 07/27/22 2214  VITAMINB12  --   --  623  FOLATE  --  18.5  --   FERRITIN  --  952*  --   TIBC  --  160*  --   IRON  --  74  --   RETICCTPCT 2.1  --   --    Most Recent Urinalysis On File:     Component  Value Date/Time   COLORURINE YELLOW (A) 07/27/2022 1825   APPEARANCEUR CLEAR (A) 07/27/2022 1825   APPEARANCEUR Clear 09/18/2020 1153   LABSPEC 1.015 07/27/2022 1825   LABSPEC 1.026 03/01/2013 1844   PHURINE 5.0 07/27/2022 1825   GLUCOSEU 50 (A) 07/27/2022 1825   GLUCOSEU >=500 03/01/2013 1844   HGBUR SMALL (A) 07/27/2022 1825   BILIRUBINUR NEGATIVE 07/27/2022 1825   BILIRUBINUR neg 10/23/2020 0953   BILIRUBINUR Negative 09/18/2020 1153   BILIRUBINUR Negative 03/01/2013 1844   KETONESUR NEGATIVE 07/27/2022 1825   PROTEINUR 100 (A) 07/27/2022 1825   UROBILINOGEN 0.2 10/23/2020 0953   NITRITE NEGATIVE 07/27/2022 1825   LEUKOCYTESUR NEGATIVE 07/27/2022 1825   LEUKOCYTESUR Negative 03/01/2013 1844   Sepsis Labs: '@LABRCNTIP'$ (procalcitonin:4,lacticidven:4) Microbiology: Recent Results (from the past 240 hour(s))  Resp panel by RT-PCR (RSV, Flu A&B, Covid) Anterior Nasal Swab     Status: None   Collection Time: 07/27/22  5:34 PM   Specimen: Anterior Nasal Swab  Result Value Ref Range Status   SARS Coronavirus 2 by RT PCR NEGATIVE NEGATIVE Final    Comment: (NOTE) SARS-CoV-2 target nucleic acids are NOT DETECTED.  The SARS-CoV-2 RNA is generally detectable in upper respiratory specimens during the acute phase of infection. The lowest concentration of SARS-CoV-2 viral copies this assay can detect is 138 copies/mL. A negative result does not preclude SARS-Cov-2 infection and should not be used as the sole basis for treatment or other patient management decisions. A negative result may occur with  improper specimen collection/handling, submission of specimen other than nasopharyngeal swab, presence of viral mutation(s) within the areas targeted by this assay, and inadequate number of viral copies(<138 copies/mL). A negative result must be combined with clinical observations, patient history, and epidemiological information. The expected result is Negative.  Fact Sheet for Patients:   EntrepreneurPulse.com.au  Fact Sheet for Healthcare Providers:  IncredibleEmployment.be  This test is no t yet approved or cleared by the [place] FDA and  has been authorized for detection and/or diagnosis of SARS-CoV-2 by FDA under an Emergency Use Authorization (EUA). This EUA will remain  in effect (meaning this test can be used) for the duration of the COVID-19 declaration under Section 564(b)(1) of the Act, 21 U.S.C.section 360bbb-3(b)(1), unless the authorization is terminated  or revoked sooner.       Influenza A by PCR NEGATIVE NEGATIVE Final   Influenza B by PCR NEGATIVE NEGATIVE Final    Comment: (NOTE) The Xpert Xpress SARS-CoV-2/FLU/RSV plus assay is intended as an aid in the diagnosis of influenza from Nasopharyngeal swab specimens and should not be used as a sole basis for treatment. Nasal washings and aspirates are unacceptable for Xpert Xpress SARS-CoV-2/FLU/RSV testing.  Fact Sheet for Patients: EntrepreneurPulse.com.au  Fact Sheet for Healthcare Providers: IncredibleEmployment.be  This test is not yet approved or cleared by the [place] FDA and has been authorized for detection and/or diagnosis of SARS-CoV-2 by FDA under an Emergency Use Authorization (EUA). This EUA will remain in effect (meaning this test can be used) for the duration of the COVID-19 declaration under Section 564(b)(1) of the Act, 21 U.S.C. section 360bbb-3(b)(1), unless the authorization is terminated or revoked.     Resp Syncytial Virus by PCR NEGATIVE NEGATIVE Final    Comment: (NOTE) Fact Sheet for Patients: EntrepreneurPulse.com.au  Fact Sheet for Healthcare Providers: IncredibleEmployment.be  This test is not yet approved or cleared by the [place] FDA and has been authorized for detection and/or diagnosis of SARS-CoV-2 by FDA under an Emergency Use  Authorization (EUA). This EUA will remain in effect (meaning this test can be used) for the duration of the COVID-19 declaration under Section 564(b)(1) of the Act, 21 U.S.C. section 360bbb-3(b)(1), unless the authorization is terminated or revoked.  Performed at [facility], [address], [place], [place] [address]       Radiology Studies last 3 days: CT ANGIO HEAD NECK W WO CM  Result Date: 07/27/2022 CLINICAL DATA:  Collapsed earlier today. EXAM: CT ANGIOGRAPHY HEAD AND NECK TECHNIQUE: Multidetector CT imaging of the head and neck was performed using the standard protocol during bolus administration of intravenous contrast. Multiplanar CT image reconstructions and MIPs were obtained to evaluate the vascular anatomy. Carotid stenosis measurements (when applicable) are obtained utilizing NASCET criteria, using the distal internal carotid diameter as the denominator. RADIATION DOSE REDUCTION: This exam was performed according to the departmental dose-optimization program which includes automated exposure control, adjustment of the mA and/or kV according to patient size and/or use of iterative reconstruction technique. CONTRAST:  61m OMNIPAQUE IOHEXOL 350 MG/ML SOLN COMPARISON:  Same-day noncontrast CT head FINDINGS: CTA NECK FINDINGS Aortic arch: The imaged aortic arch is normal. The origins of the major branch vessels are patent. The subclavian arteries are patent to the level imaged. Right carotid system: The right common, internal, and external carotid arteries are patent, without stenosis or occlusion. There is no evidence of dissection or aneurysm. Left carotid system: The left common, internal, and external carotid arteries are patent, without stenosis or occlusion. There is no evidence of dissection or aneurysm. Vertebral arteries: The vertebral arteries are patent, without hemodynamically significant stenosis or occlusion. There is no evidence of dissection or aneurysm. Skeleton:  There is no acute osseous abnormality or suspicious osseous lesion. There is no visible  canal hematoma. Other neck: Soft tissues of the neck are unremarkable. Upper chest: The imaged lung apices are clear. Review of the MIP images confirms the above findings CTA HEAD FINDINGS Anterior circulation: The intracranial ICAs are normal. The bilateral MCAs are normal. The bilateral ACAs are normal. The anterior communicating artery is normal There is no aneurysm or AVM. Posterior circulation: The bilateral V4 segments are normal. There is fenestration in the proximal basilar artery, a normal variant. The basilar artery is otherwise normal. The major cerebellar arteries appear normal. The bilateral PCAs are normal. Bilateral posterior communicating arteries are identified. There is no aneurysm or AVM. Venous sinuses: Patent. Anatomic variants: As above. Review of the MIP images confirms the above findings IMPRESSION: Normal vasculature of the head and neck. Electronically Signed   By: Valetta Mole M.D.   On: 07/27/2022 19:24   DG Chest Portable 1 View  Result Date: 07/27/2022 CLINICAL DATA:  Syncope. EXAM: PORTABLE CHEST 1 VIEW COMPARISON:  AP chest 06/09/2022 FINDINGS: Cardiac silhouette and mediastinal contours are within normal limits. The lungs are clear. No pleural effusion or pneumothorax. No acute skeletal abnormality. IMPRESSION: No active disease. Electronically Signed   By: Yvonne Kendall M.D.   On: 07/27/2022 17:01   CT HEAD WO CONTRAST (5MM)  Result Date: 07/27/2022 CLINICAL DATA:  Head trauma, abnormal mental status (Age 68-64y); Ataxia, cervical trauma EXAM: CT HEAD WITHOUT CONTRAST CT CERVICAL SPINE WITHOUT CONTRAST TECHNIQUE: Multidetector CT imaging of the head and cervical spine was performed following the standard protocol without intravenous contrast. Multiplanar CT image reconstructions of the cervical spine were also generated. RADIATION DOSE REDUCTION: This exam was performed according to the  departmental dose-optimization program which includes automated exposure control, adjustment of the mA and/or kV according to patient size and/or use of iterative reconstruction technique. COMPARISON:  None Available. FINDINGS: CT HEAD FINDINGS Brain: No evidence of acute infarction, hemorrhage, hydrocephalus, extra-axial collection or mass lesion/mass effect. Vascular: No hyperdense vessel identified. Skull: No acute fracture. Sinuses/Orbits: Clear sinuses.  No acute orbital findings. Other: No mastoid effusions. CT CERVICAL SPINE FINDINGS Alignment: Straightening.  No substantial sagittal subluxation. Skull base and vertebrae: Vertebral body heights are maintained. No evidence of acute fracture. Soft tissues and spinal canal: No prevertebral fluid or swelling. No visible canal hematoma. Disc levels:  No significant focal bony degenerative change. Upper chest: Visualized lung apices are clear. IMPRESSION: No acute intracranial or cervical spine findings. Electronically Signed   By: Margaretha Sheffield M.D.   On: 07/27/2022 16:56   CT Cervical Spine Wo Contrast  Result Date: 07/27/2022 CLINICAL DATA:  Head trauma, abnormal mental status (Age 61-64y); Ataxia, cervical trauma EXAM: CT HEAD WITHOUT CONTRAST CT CERVICAL SPINE WITHOUT CONTRAST TECHNIQUE: Multidetector CT imaging of the head and cervical spine was performed following the standard protocol without intravenous contrast. Multiplanar CT image reconstructions of the cervical spine were also generated. RADIATION DOSE REDUCTION: This exam was performed according to the departmental dose-optimization program which includes automated exposure control, adjustment of the mA and/or kV according to patient size and/or use of iterative reconstruction technique. COMPARISON:  None Available. FINDINGS: CT HEAD FINDINGS Brain: No evidence of acute infarction, hemorrhage, hydrocephalus, extra-axial collection or mass lesion/mass effect. Vascular: No hyperdense vessel  identified. Skull: No acute fracture. Sinuses/Orbits: Clear sinuses.  No acute orbital findings. Other: No mastoid effusions. CT CERVICAL SPINE FINDINGS Alignment: Straightening.  No substantial sagittal subluxation. Skull base and vertebrae: Vertebral body heights are maintained. No evidence of acute fracture. Soft tissues  and spinal canal: No prevertebral fluid or swelling. No visible canal hematoma. Disc levels:  No significant focal bony degenerative change. Upper chest: Visualized lung apices are clear. IMPRESSION: No acute intracranial or cervical spine findings. Electronically Signed   By: Margaretha Sheffield M.D.   On: 07/27/2022 16:56             LOS: 0 days       Emeterio Reeve, DO [facility] 07/28/2022, 1:14 PM    Dictation software may have been used to generate the above note. Typos may occur and escape review in typed/dictated notes. Please contact Dr Sheppard Coil directly for clarity if needed.  Staff may message me via secure chat in [place]  but this may not receive an immediate response,  please page me for urgent matters!  If 7PM-7AM, please contact night coverage [email or web address]

## 2022-07-29 ENCOUNTER — Other Ambulatory Visit: Payer: Self-pay

## 2022-07-29 DIAGNOSIS — E162 Hypoglycemia, unspecified: Secondary | ICD-10-CM | POA: Diagnosis not present

## 2022-07-29 LAB — GLUCOSE, CAPILLARY
Glucose-Capillary: 117 mg/dL — ABNORMAL HIGH (ref 70–99)
Glucose-Capillary: 119 mg/dL — ABNORMAL HIGH (ref 70–99)
Glucose-Capillary: 147 mg/dL — ABNORMAL HIGH (ref 70–99)
Glucose-Capillary: 154 mg/dL — ABNORMAL HIGH (ref 70–99)
Glucose-Capillary: 191 mg/dL — ABNORMAL HIGH (ref 70–99)
Glucose-Capillary: 240 mg/dL — ABNORMAL HIGH (ref 70–99)
Glucose-Capillary: 387 mg/dL — ABNORMAL HIGH (ref 70–99)
Glucose-Capillary: 442 mg/dL — ABNORMAL HIGH (ref 70–99)
Glucose-Capillary: 53 mg/dL — ABNORMAL LOW (ref 70–99)
Glucose-Capillary: 60 mg/dL — ABNORMAL LOW (ref 70–99)

## 2022-07-29 LAB — CBC
HCT: 24.5 % — ABNORMAL LOW (ref 36.0–46.0)
Hemoglobin: 8.6 g/dL — ABNORMAL LOW (ref 12.0–15.0)
MCH: 29.8 pg (ref 26.0–34.0)
MCHC: 35.1 g/dL (ref 30.0–36.0)
MCV: 84.8 fL (ref 80.0–100.0)
Platelets: 226 10*3/uL (ref 150–400)
RBC: 2.89 MIL/uL — ABNORMAL LOW (ref 3.87–5.11)
RDW: 13.3 % (ref 11.5–15.5)
WBC: 8 10*3/uL (ref 4.0–10.5)
nRBC: 0 % (ref 0.0–0.2)

## 2022-07-29 LAB — BASIC METABOLIC PANEL
Anion gap: 8 (ref 5–15)
Anion gap: 7 (ref 5–15)
BUN: 66 mg/dL — ABNORMAL HIGH (ref 6–20)
BUN: 66 mg/dL — ABNORMAL HIGH (ref 6–20)
CO2: 24 mmol/L (ref 22–32)
CO2: 21 mmol/L — ABNORMAL LOW (ref 22–32)
Calcium: 8.8 mg/dL — ABNORMAL LOW (ref 8.9–10.3)
Calcium: 8.4 mg/dL — ABNORMAL LOW (ref 8.9–10.3)
Chloride: 101 mmol/L (ref 98–111)
Chloride: 103 mmol/L (ref 98–111)
Creatinine, Ser: 2.19 mg/dL — ABNORMAL HIGH (ref 0.44–1.00)
Creatinine, Ser: 2.08 mg/dL — ABNORMAL HIGH (ref 0.44–1.00)
GFR, Estimated: 30 mL/min — ABNORMAL LOW (ref >60)
GFR, Estimated: 32 mL/min — ABNORMAL LOW (ref >60)
Glucose, Bld: 101 mg/dL — ABNORMAL HIGH (ref 70–99)
Glucose, Bld: 307 mg/dL — ABNORMAL HIGH (ref 70–99)
Potassium: 5.9 mmol/L — ABNORMAL HIGH (ref 3.5–5.1)
Potassium: 6.5 mmol/L (ref 3.5–5.1)
Sodium: 133 mmol/L — ABNORMAL LOW (ref 135–145)
Sodium: 131 mmol/L — ABNORMAL LOW (ref 135–145)

## 2022-07-29 LAB — POTASSIUM
Potassium: 6.3 mmol/L (ref 3.5–5.1)
Potassium: 5.3 mmol/L — ABNORMAL HIGH (ref 3.5–5.1)
Potassium: 5.8 mmol/L — ABNORMAL HIGH (ref 3.5–5.1)

## 2022-07-29 MED ORDER — INSULIN ASPART 100 UNIT/ML IV SOLN
5.0000 [IU] | Freq: Once | INTRAVENOUS | Status: AC
  Administered 2022-07-29: 5 [IU] via INTRAVENOUS
  Filled 2022-07-29: qty 0.05

## 2022-07-29 MED ORDER — SODIUM CHLORIDE 0.9 % IV BOLUS
500.0000 mL | Freq: Once | INTRAVENOUS | Status: AC
  Administered 2022-07-29: 500 mL via INTRAVENOUS

## 2022-07-29 MED ORDER — SODIUM CHLORIDE 0.9 % IV SOLN: INTRAVENOUS | Status: AC

## 2022-07-29 MED ORDER — INSULIN ASPART 100 UNIT/ML IV SOLN
5.0000 [IU] | Freq: Once | INTRAVENOUS | Status: DC
  Filled 2022-07-29: qty 0.05

## 2022-07-29 MED ORDER — OSMOLITE 1.5 CAL PO LIQD
780.0000 mL | ORAL | Status: DC
  Administered 2022-07-29: 780 mL

## 2022-07-29 MED ORDER — INSULIN ASPART 100 UNIT/ML IJ SOLN
0.0000 [IU] | INTRAMUSCULAR | Status: DC
  Administered 2022-07-29: 5 [IU] via SUBCUTANEOUS
  Administered 2022-07-29: 2 [IU] via SUBCUTANEOUS
  Administered 2022-07-30: 6 [IU] via SUBCUTANEOUS
  Administered 2022-07-30: 5 [IU] via SUBCUTANEOUS
  Filled 2022-07-29 (×4): qty 1

## 2022-07-29 MED ORDER — OSMOLITE 1.5 CAL PO LIQD: 780.0000 mL | ORAL | Status: DC

## 2022-07-29 MED ORDER — DEXTROSE 50 % IV SOLN: 25.0000 mL | Freq: Once | INTRAVENOUS | Status: DC

## 2022-07-29 MED ORDER — ORAL CARE MOUTH RINSE: 15.0000 mL | OROMUCOSAL | Status: DC | PRN

## 2022-07-29 MED ORDER — INSULIN GLARGINE-YFGN 100 UNIT/ML ~~LOC~~ SOLN
4.0000 [IU] | Freq: Every day | SUBCUTANEOUS | Status: DC
  Administered 2022-07-29: 4 [IU] via SUBCUTANEOUS
  Filled 2022-07-29 (×2): qty 0.04

## 2022-07-29 MED ORDER — DEXTROSE 50 % IV SOLN
25.0000 mL | Freq: Once | INTRAVENOUS | Status: AC
  Administered 2022-07-29: 25 mL via INTRAVENOUS
  Filled 2022-07-29: qty 50

## 2022-07-29 NOTE — Progress Notes (Signed)
Mobility Specialist - Progress Note     07/29/22 0938  Mobility  Activity Ambulated independently in hallway;Stood at bedside  Level of Assistance Independent  Assistive Device None  Distance Ambulated (ft) 500 ft  Activity Response Tolerated well  Mobility Referral Yes  $Mobility charge 1 Mobility   Pt resting in bed on RA upon entry. Pt STS and ambulates to hallway/Medical mall indep. Pt returned to bed and left with needs in reach.   Loma Sender Mobility Specialist 07/29/22, 9:40 AM

## 2022-07-29 NOTE — Plan of Care (Signed)

## 2022-07-29 NOTE — Inpatient Diabetes Management (Signed)
Inpatient Diabetes Program Recommendations  AACE/ADA: New Consensus Statement on Inpatient Glycemic Control   Target Ranges:  Prepandial:   less than 140 mg/dL      Peak postprandial:   less than 180 mg/dL (1-2 hours)      Critically ill patients:  140 - 180 mg/dL    Latest Reference Range & Units 07/29/22 00:28 07/29/22 02:29 07/29/22 03:51 07/29/22 04:44 07/29/22 05:14 07/29/22 06:08 07/29/22 08:09  Glucose-Capillary 70 - 99 mg/dL 442 (H)  Novolog 9 units '@00'$ :44 191 (H) 53 (L) 60 (L) 119 (H) 117 (H) 154 (H)  Novolog 2 units '@8'$ :34    Latest Reference Range & Units 07/28/22 07:32 07/28/22 07:56 07/28/22 08:17 07/28/22 11:53 07/28/22 14:25 07/28/22 16:01 07/28/22 16:18 07/28/22 20:57 07/28/22 23:09  Glucose-Capillary 70 - 99 mg/dL 40 57 (L) 262 (H) 573 (HH)  Novolog 9 units '@12'$ :39 327 (H) 64 (L) 69 (L) 439 (H)  Novolog 9 units '@21'$ :51 496 (H)   Review of Glycemic Control  Diabetes history: DM1 (does NOT make any insulin; requires basal, correction, and carb coverage insulin; very sensitive to insulin) Outpatient Diabetes medications: Basaglar 4 units BID, Novolog 2 units TID with meals plus correction Current orders for Inpatient glycemic control: Novolog 0-9 units Q4H; Cortef 20 mg QAM, Cortef 10 mg QPM   Inpatient Diabetes Program Recommendations:     Insulin: Glucose has ranged from 40-573 mg/dl over the past 26 hours and patient has experienced hypoglycemia 2 times after getting max dose of Novolog 9 units for correction. Patient has Type 1 DM so she needs basal insulin and if she eats she needs Novolog meal coverage and she is very sensitive to insulin.  Please consider discontinuing Novolog 0-9 units Q4H and order Novolog 0-6 units Q4H. Please consider ordering Semglee 4 units Q24H and Novolog 2 units TID with meals if she eats at least 50% of meals.   NOTE: Patient admitted with hypoglycemia, acute on chronic kidney injury, and anemia. Patient is well known to inpatient diabetes  team due to frequent prolonged hospital admissions. Patient was last inpatient 06/09/22-07/09/22 and last seen by our team on 07/09/22. Patient has Type 1 DM and is very sensitive to insulin.    Thanks, Barnie Alderman, RN, MSN, [place] Diabetes Coordinator Inpatient Diabetes Program [phone] (Team Pager from 8am to [place])

## 2022-07-29 NOTE — Progress Notes (Addendum)
PROGRESS NOTE    Ashley Camacho   [id number] DOB: [birth year]  DOA: 07/27/2022 Date of Service: 07/29/22 PCP: Merryl Hacker, No     Brief Narrative / Hospital Course:  Ashley Camacho is a 32 year old female with history of hypertension, insulin-dependent diabetes mellitus, hypothyroid, history of hypertension, CKD stage IIIb, adrenal insufficiency, hypothyroid, major depressive disorder, presence of jejunostomy tube, protein calorie malnutrition, who presents emergency department from home via EMS on 07/27/2022 for chief concerns of hypoglycemia. Jejunostomy tube placed 03/04, reportedly had tube placed due to failure to eat. There was a concern that patient went to take a nap at 10 AM and then she went up to wake up and get a snack and she passed out. Family were concerned that she did hit her head but no obvious trauma noted Patient was significantly altered per EMS sugars were in the 30s and patient was given some D10 and sugars increased but patient is was altered on arrival to ED. She reports she did not take her insulin on date of arrival to ED. She last used insulin 07/26/22 evening. She states her blood glucose was 264 that night.  03/05: admitted to hospitalist service late evening 03/06-03/07: hypoglycemic in AM and hyperglycemic in afternoon, transitioned to q4h CBG checks, dietary following. Restarting tube feeds qhs and basal insulin daily vs home bid dose. AKI treating w/ fluids.  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    Hyperkalemia  can't safely give lasix d/t renal fxn, can't safely give albuterol d/t prolonged QT hx, can't safely give kayexalate w/ bowel dysfunction  ordered another insulin + dextrose and bolus NS renal fxn improving somewhat, monitor closely  Gastroparesis Feeding tube in place Appreciate dietary recs Ideally would like "backup plan" in place for patient to have tube feeds as needed / qhs - she stated qhs tube feeds w/ po intake during the day worked well for her in the past   Anemia of chronic disease Hemoglobin is 8.4, her baseline is 9.0-9.1 over the last 3 weeks No indication for blood transfusion at this time and patient denies any clinical signs of blood loss   Acute kidney injury superimposed on chronic kidney disease stage 3b ([place]) Restarted fluids, NS 125 ml/hr, 1L ordered BMP in the AM   Adrenal insufficiency (HCC) home Solu-Cortef 10-20 mg per home dosing 20 mg in the morning, 10 mg at  night   Hypothyroidism TSH WNL Levothyroxine 50 mcg daily before breakfast resumed   Insomnia Melatonin 5 mg nightly as needed   Essential hypertension Amlodipine 5 mg daily resumed Hydralazine 5 mg IV every 8 hours as needed for SBP greater 170, 5 days ordered   Anxiety Resumed home fluoxetine 10 mg daily   Prolonged QT avoid scheduled QT prolonging  agents Holding home mirtazapine     DVT prophylaxis: heparin - pt has declined  Pertinent IV fluids/nutrition: NS 125 mL/h x1L. Plan for tube feeds at night and po as tolerated during the day Central lines / invasive devices: jejunostomy tube   Code Status: FULL CODE   Current Admission Status: observation   TOC needs / Dispo plan: anticipate d/t to prevous home environment, will need tube feed supplies Barriers to discharge / significant pending items: glc stability, dietary recs, titrating tube feeds              Subjective / Brief ROS:  Patient reports nausea and vomiting is better, no vomiting since yesterdya, tolerating some po intake.  Denies CP/SOB.  Pain controlled.  Denies new weakness.  Reports no concerns w/ urination/defecation.   Family Communication: none at this time    Objective Findings:  Vitals:   07/29/22 0253 07/29/22 0357 07/29/22 0704 07/29/22 1258  BP:  (!) 83/53 109/71 129/77  Pulse:  88 85 86  Resp:  18  18  Temp:  98.7 F (37.1 C)    TempSrc:      SpO2:  100%  100%  Weight: 43.3 kg       Intake/Output Summary (Last 24 hours) at 07/29/2022 1318 Last data filed at 07/29/2022 1010 Gross per 24 hour  Intake 1086 ml  Output --  Net 1086 ml   Filed Weights   07/29/22 0253  Weight: 43.3 kg    Examination:  Physical Exam Constitutional:      General: She is not in acute distress.    Appearance: Normal appearance. She is ill-appearing.  Cardiovascular:     Rate and Rhythm: Normal rate and regular rhythm.     Heart sounds: Normal heart sounds.  Pulmonary:     Effort: Pulmonary effort is normal.     Breath sounds: Normal breath sounds.  Abdominal:     General: Abdomen is flat. Bowel sounds are normal.     Palpations: Abdomen is soft.  Skin:    General: Skin is warm and dry.  Neurological:     General: No focal deficit present.     Mental Status: She is alert and oriented to person, place, and time.  Psychiatric:         Mood and Affect: Mood normal.        Behavior: Behavior normal.          Scheduled Medications:   amLODipine  5 mg Oral Daily   insulin aspart  5 Units Intravenous Once   And   dextrose  25 mL Intravenous Once   feeding supplement (GLUCERNA SHAKE)  237 mL Oral TID BM   feeding supplement (OSMOLITE 1.5 CAL)  780 mL Per Tube Q24H   ferrous sulfate  325 mg Oral Q breakfast   FLUoxetine  10 mg Oral Daily   heparin  5,000 Units Subcutaneous Q8H   hydrocortisone  20 mg Oral Q breakfast   And   hydrocortisone  10 mg Oral Q supper   insulin aspart  0-6 Units Subcutaneous Q4H   insulin glargine-yfgn  4  Units Subcutaneous Daily   levothyroxine  50 mcg Oral Q0600   multivitamin with minerals  1 tablet Oral Daily    Continuous Infusions:  sodium chloride 100 mL/hr at 07/29/22 0758    PRN Medications:  acetaminophen **OR** acetaminophen, dextrose, hydrALAZINE, melatonin, ondansetron **OR** ondansetron (ZOFRAN) IV, mouth rinse, senna-docusate  Antimicrobials from admission:  Anti-infectives (From admission, onward)    None           Data Reviewed:  I have personally reviewed the following...  CBC: Recent Labs  Lab 07/27/22 1733 07/28/22 0447 07/29/22 0532  WBC 4.8 5.1 8.0  NEUTROABS 3.0  --   --   HGB 8.4* 8.7* 8.6*  HCT 24.4* 24.6* 24.5*  MCV 87.8 85.1 84.8  PLT 178 193 [id number]   Basic Metabolic Panel: Recent Labs  Lab 07/27/22 1733 07/27/22 2214 07/28/22 0447 07/29/22 0532 07/29/22 1200  NA 137  --  137 133* 131*  K 4.0  --  4.9 5.9* 6.5*  CL 107  --  107 101 103  CO2 24  --  23 24 21*  GLUCOSE 90  --  125* 101* 307*  BUN 51*  --  49* 66* 66*  CREATININE 1.57*  --  1.68* 2.19* 2.08*  CALCIUM 8.6*  --  8.5* 8.8* 8.4*  MG 1.9  --   --   --   --   PHOS  --  3.8  --   --   --    GFR: Estimated Creatinine Clearance: 26.8 mL/min (A) (by C-G formula based on SCr of 2.08 mg/dL (H)). Liver Function Tests: Recent Labs  Lab 07/27/22 1733  AST 29  ALT 18   ALKPHOS 84  BILITOT 0.5  PROT 6.8  ALBUMIN 3.6   No results for input(s): "LIPASE", "AMYLASE" in the last 168 hours. No results for input(s): "AMMONIA" in the last 168 hours. Coagulation Profile: No results for input(s): "INR", "PROTIME" in the last 168 hours. Cardiac Enzymes: No results for input(s): "CKTOTAL", "CKMB", "CKMBINDEX", "TROPONINI" in the last 168 hours. BNP (last 3 results) No results for input(s): "PROBNP" in the last 8760 hours. HbA1C: No results for input(s): "HGBA1C" in the last 72 hours. CBG: Recent Labs  Lab 07/29/22 0444 07/29/22 0514 07/29/22 0608 07/29/22 0809 07/29/22 1259  GLUCAP 60* 119* 117* 154* 387*   Lipid Profile: No results for input(s): "CHOL", "HDL", "LDLCALC", "TRIG", "CHOLHDL", "LDLDIRECT" in the last 72 hours. Thyroid Function Tests: Recent Labs    07/27/22 2214  TSH 3.091   Anemia Panel: Recent Labs    07/27/22 1734 07/27/22 1947 07/27/22 2214  VITAMINB12  --   --  623  FOLATE  --  18.5  --   FERRITIN  --  952*  --   TIBC  --  160*  --   IRON  --  74  --   RETICCTPCT 2.1  --   --    Most Recent Urinalysis On File:     Component Value Date/Time   COLORURINE YELLOW (A) 07/27/2022 1825   APPEARANCEUR CLEAR (A) 07/27/2022 1825   APPEARANCEUR Clear 09/18/2020 1153   LABSPEC 1.015 07/27/2022 1825   LABSPEC 1.026 03/01/2013 1844   PHURINE 5.0 07/27/2022 1825   GLUCOSEU 50 (A) 07/27/2022 1825   GLUCOSEU >=500 03/01/2013 1844   HGBUR SMALL (A) 07/27/2022 1825   BILIRUBINUR NEGATIVE 07/27/2022 1825   BILIRUBINUR neg 10/23/2020 0953   BILIRUBINUR Negative 09/18/2020 1153   BILIRUBINUR Negative 03/01/2013 1844   [place] 07/27/2022  [place] (A) 07/27/2022 1825   UROBILINOGEN 0.2 10/23/2020 0953   NITRITE NEGATIVE 07/27/2022 1825   LEUKOCYTESUR NEGATIVE 07/27/2022 1825   LEUKOCYTESUR Negative 03/01/2013 1844   Sepsis Labs: '@LABRCNTIP'$ (procalcitonin:4,lacticidven:4) Microbiology: Recent Results  (from the past 240 hour(s))  Resp panel by RT-PCR (RSV, Flu A&B, Covid) Anterior Nasal Swab     Status: None   Collection Time: 07/27/22  5:34 PM   Specimen: Anterior Nasal Swab  Result Value Ref Range Status   SARS Coronavirus 2 by RT PCR NEGATIVE NEGATIVE Final    Comment: (NOTE) SARS-CoV-2 target nucleic acids are NOT DETECTED.  The SARS-CoV-2 RNA is generally detectable in upper respiratory specimens during the acute phase of infection. The lowest concentration of SARS-CoV-2 viral copies this assay can detect is 138 copies/mL. A negative result does not preclude SARS-Cov-2 infection and should not be used as the sole basis for treatment or other patient management decisions. A negative result may occur with  improper specimen collection/handling, submission of specimen other than nasopharyngeal swab, presence of viral mutation(s) within the areas targeted by this assay, and inadequate number of viral copies(<138 copies/mL). A negative result must be combined with clinical observations, patient history, and epidemiological information. The expected result is Negative.  Fact Sheet for Patients:  EntrepreneurPulse.com.au  Fact Sheet for Healthcare Providers:  IncredibleEmployment.be  This test is no t yet approved or cleared by the [place] FDA and  has been authorized for detection and/or diagnosis of SARS-CoV-2 by FDA under an Emergency Use Authorization (EUA). This EUA will remain  in effect (meaning this test can be used) for the duration of the COVID-19 declaration under Section 564(b)(1) of the Act, 21 U.S.C.section 360bbb-3(b)(1), unless the authorization is terminated  or revoked sooner.       Influenza A by PCR NEGATIVE NEGATIVE Final   Influenza B by PCR NEGATIVE NEGATIVE Final    Comment: (NOTE) The Xpert Xpress SARS-CoV-2/FLU/RSV plus assay is intended as an aid in the diagnosis of influenza from Nasopharyngeal swab  specimens and should not be used as a sole basis for treatment. Nasal washings and aspirates are unacceptable for Xpert Xpress SARS-CoV-2/FLU/RSV testing.  Fact Sheet for Patients: EntrepreneurPulse.com.au  Fact Sheet for Healthcare Providers: IncredibleEmployment.be  This test is not yet approved or cleared by the [place] FDA and has been authorized for detection and/or diagnosis of SARS-CoV-2 by FDA under an Emergency Use Authorization (EUA). This EUA will remain in effect (meaning this test can be used) for the duration of the COVID-19 declaration under Section 564(b)(1) of the Act, 21 U.S.C. section 360bbb-3(b)(1), unless the authorization is terminated or revoked.     Resp Syncytial Virus by PCR NEGATIVE NEGATIVE Final    Comment: (NOTE) Fact Sheet for Patients: EntrepreneurPulse.com.au  Fact Sheet for Healthcare Providers: IncredibleEmployment.be  This test is not yet approved or cleared by the [place] FDA and has been authorized for detection and/or diagnosis of SARS-CoV-2 by FDA under an Emergency Use Authorization (EUA). This EUA will remain in effect (meaning this test can be used) for the duration of the COVID-19 declaration under Section 564(b)(1) of the Act, 21 U.S.C. section 360bbb-3(b)(1), unless the authorization is terminated or revoked.  Performed at [facility], [address] [place],  [address]       Radiology Studies last 3 days: CT ANGIO HEAD NECK W WO CM  Result Date: 07/27/2022 CLINICAL DATA:  Collapsed earlier today. EXAM: CT ANGIOGRAPHY HEAD AND NECK TECHNIQUE: Multidetector  CT imaging of the head and neck was performed using the standard protocol during bolus administration of intravenous contrast. Multiplanar CT image reconstructions and MIPs were obtained to evaluate the vascular anatomy. Carotid stenosis measurements (when applicable) are  obtained utilizing NASCET criteria, using the distal internal carotid diameter as the denominator. RADIATION DOSE REDUCTION: This exam was performed according to the departmental dose-optimization program which includes automated exposure control, adjustment of the mA and/or kV according to patient size and/or use of iterative reconstruction technique. CONTRAST:  26m OMNIPAQUE IOHEXOL 350 MG/ML SOLN COMPARISON:  Same-day noncontrast CT head FINDINGS: CTA NECK FINDINGS Aortic arch: The imaged aortic arch is normal. The origins of the major branch vessels are patent. The subclavian arteries are patent to the level imaged. Right carotid system: The right common, internal, and external carotid arteries are patent, without stenosis or occlusion. There is no evidence of dissection or aneurysm. Left carotid system: The left common, internal, and external carotid arteries are patent, without stenosis or occlusion. There is no evidence of dissection or aneurysm. Vertebral arteries: The vertebral arteries are patent, without hemodynamically significant stenosis or occlusion. There is no evidence of dissection or aneurysm. Skeleton: There is no acute osseous abnormality or suspicious osseous lesion. There is no visible canal hematoma. Other neck: Soft tissues of the neck are unremarkable. Upper chest: The imaged lung apices are clear. Review of the MIP images confirms the above findings CTA HEAD FINDINGS Anterior circulation: The intracranial ICAs are normal. The bilateral MCAs are normal. The bilateral ACAs are normal. The anterior communicating artery is normal There is no aneurysm or AVM. Posterior circulation: The bilateral V4 segments are normal. There is fenestration in the proximal basilar artery, a normal variant. The basilar artery is otherwise normal. The major cerebellar arteries appear normal. The bilateral PCAs are normal. Bilateral posterior communicating arteries are identified. There is no aneurysm or AVM.  Venous sinuses: Patent. Anatomic variants: As above. Review of the MIP images confirms the above findings IMPRESSION: Normal vasculature of the head and neck. Electronically Signed   By: PValetta MoleM.D.   On: 07/27/2022 19:24   DG Chest Portable 1 View  Result Date: 07/27/2022 CLINICAL DATA:  Syncope. EXAM: PORTABLE CHEST 1 VIEW COMPARISON:  AP chest 06/09/2022 FINDINGS: Cardiac silhouette and mediastinal contours are within normal limits. The lungs are clear. No pleural effusion or pneumothorax. No acute skeletal abnormality. IMPRESSION: No active disease. Electronically Signed   By: RYvonne KendallM.D.   On: 07/27/2022 17:01   CT HEAD WO CONTRAST (5MM)  Result Date: 07/27/2022 CLINICAL DATA:  Head trauma, abnormal mental status (Age 32-64y; Ataxia, cervical trauma EXAM: CT HEAD WITHOUT CONTRAST CT CERVICAL SPINE WITHOUT CONTRAST TECHNIQUE: Multidetector CT imaging of the head and cervical spine was performed following the standard protocol without intravenous contrast. Multiplanar CT image reconstructions of the cervical spine were also generated. RADIATION DOSE REDUCTION: This exam was performed according to the departmental dose-optimization program which includes automated exposure control, adjustment of the mA and/or kV according to patient size and/or use of iterative reconstruction technique. COMPARISON:  None Available. FINDINGS: CT HEAD FINDINGS Brain: No evidence of acute infarction, hemorrhage, hydrocephalus, extra-axial collection or mass lesion/mass effect. Vascular: No hyperdense vessel identified. Skull: No acute fracture. Sinuses/Orbits: Clear sinuses.  No acute orbital findings. Other: No mastoid effusions. CT CERVICAL SPINE FINDINGS Alignment: Straightening.  No substantial sagittal subluxation. Skull base and vertebrae: Vertebral body heights are maintained. No evidence of acute fracture. Soft tissues and spinal canal:  No prevertebral fluid or swelling. No visible canal hematoma. Disc  levels:  No significant focal bony degenerative change. Upper chest: Visualized lung apices are clear. IMPRESSION: No acute intracranial or cervical spine findings. Electronically Signed   By: Margaretha Sheffield M.D.   On: 07/27/2022 16:56   CT Cervical Spine Wo Contrast  Result Date: 07/27/2022 CLINICAL DATA:  Head trauma, abnormal mental status (Age 9-64y); Ataxia, cervical trauma EXAM: CT HEAD WITHOUT CONTRAST CT CERVICAL SPINE WITHOUT CONTRAST TECHNIQUE: Multidetector CT imaging of the head and cervical spine was performed following the standard protocol without intravenous contrast. Multiplanar CT image reconstructions of the cervical spine were also generated. RADIATION DOSE REDUCTION: This exam was performed according to the departmental dose-optimization program which includes automated exposure control, adjustment of the mA and/or kV according to patient size and/or use of iterative reconstruction technique. COMPARISON:  None Available. FINDINGS: CT HEAD FINDINGS Brain: No evidence of acute infarction, hemorrhage, hydrocephalus, extra-axial collection or mass lesion/mass effect. Vascular: No hyperdense vessel identified. Skull: No acute fracture. Sinuses/Orbits: Clear sinuses.  No acute orbital findings. Other: No mastoid effusions. CT CERVICAL SPINE FINDINGS Alignment: Straightening.  No substantial sagittal subluxation. Skull base and vertebrae: Vertebral body heights are maintained. No evidence of acute fracture. Soft tissues and spinal canal: No prevertebral fluid or swelling. No visible canal hematoma. Disc levels:  No significant focal bony degenerative change. Upper chest: Visualized lung apices are clear. IMPRESSION: No acute intracranial or cervical spine findings. Electronically Signed   By: Margaretha Sheffield M.D.   On: 07/27/2022 16:56             LOS: 1 day       Emeterio Reeve, DO [facility] 07/29/2022, 1:18 PM    Dictation software may have been used to  generate the above note. Typos may occur and escape review in typed/dictated notes. Please contact Dr Sheppard Coil directly for clarity if needed.  Staff may message me via secure chat in [place]  but this may not receive an immediate response,  please page me for urgent matters!  If 7PM-7AM, please contact night coverage [email or web address]

## 2022-07-29 NOTE — Progress Notes (Signed)
Nutrition Follow-up  DOCUMENTATION CODES:   Underweight  INTERVENTION:   -Continue Glucerna Shake po TID, each supplement provides 220 kcal and 10 grams of protein  -Continue MVI with minerals daily -Continue carb modified diet -Nocturnal TF via j-tube:   Osmolite 1.5 @ 65 ml/hr via j-tube over 12 hour period (2000-0800)   30 ml free water flush every 4 hours   Tube feeding regimen provides 1170 kcal (69% of needs), 49 grams of protein, and 593 ml of H2O.  Total free water: 773 ml daily   NUTRITION DIAGNOSIS:   Inadequate oral intake related to nausea, vomiting, poor appetite as evidenced by per patient/family report.  Ongoing  GOAL:   Patient will meet greater than or equal to 90% of their needs  Progressing   MONITOR:   PO intake, Supplement acceptance, TF tolerance  REASON FOR ASSESSMENT:   Consult Assessment of nutrition requirement/status  ASSESSMENT:   Pt with history of hypertension, insulin-dependent diabetes mellitus, hypothyroid, history of hypertension, CKD stage IIIb, adrenal insufficiency, hypothyroid, major depressive disorder, presence of jejunostomy tube, protein calorie malnutrition, who presents for chief concerns of hypoglycemia.  Reviewed I/O's: +966 ml x 24 hours and +1.9 L since admission  Pt remains on carb modified diet. Noted meal completions 40-100%. Pt is consuming Glucerna supplements.   Case discussed with RN and MD; plan to continue nocturnal TF at discharge. TOC consult pending.   Medications reviewed and include cortef, ferrous sulfate, and 0.9% sodium chloride infusion @ 100 ml/hr.   Labs reviewed: Na: 131, K: 6.5, CBGS: 145-387 (inpatient orders for glycemic control are 4 units insulin glargine-yfgn daily and 0-6 units insulin aspart every 4 hours).    Diet Order:   Diet Order             Diet Carb Modified Fluid consistency: Thin  Diet effective now                   EDUCATION NEEDS:   Education needs have been  addressed  Skin:  Skin Assessment: Reviewed RN Assessment  Last BM:  07/27/22  Height:   Ht Readings from Last 1 Encounters:  07/26/22 '5\' 1"'$  (1.549 m)    Weight:   Wt Readings from Last 1 Encounters:  07/29/22 43.3 kg    Ideal Body Weight:  47.7 kg  BMI:  Body mass index is 18.05 kg/m.  Estimated Nutritional Needs:   Kcal:  1700-1900  Protein:  85-100 grams  Fluid:  > 1.7 L    Loistine Chance, RD, LDN, [place] Registered Dietitian II Certified Diabetes Care and Education Specialist Please refer to [facility] for RD and/or RD on-call/weekend/after hours pager

## 2022-07-30 DIAGNOSIS — E162 Hypoglycemia, unspecified: Secondary | ICD-10-CM | POA: Diagnosis not present

## 2022-07-30 LAB — OSMOLALITY
Osmolality: 323 mOsm/kg (ref 275–295)
Osmolality: 308 mOsm/kg — ABNORMAL HIGH (ref 275–295)

## 2022-07-30 LAB — BLOOD GAS, VENOUS
Acid-base deficit: 6 mmol/L — ABNORMAL HIGH (ref 0.0–2.0)
Bicarbonate: 19.6 mmol/L — ABNORMAL LOW (ref 20.0–28.0)
O2 Saturation: 98.2 %
Patient temperature: 37
pCO2, Ven: 38 mmHg — ABNORMAL LOW (ref 44–60)
pH, Ven: 7.32 (ref 7.25–7.43)
pO2, Ven: 72 mmHg — ABNORMAL HIGH (ref 32–45)

## 2022-07-30 LAB — GLUCOSE, CAPILLARY
Glucose-Capillary: 131 mg/dL — ABNORMAL HIGH (ref 70–99)
Glucose-Capillary: 133 mg/dL — ABNORMAL HIGH (ref 70–99)
Glucose-Capillary: 157 mg/dL — ABNORMAL HIGH (ref 70–99)
Glucose-Capillary: 162 mg/dL — ABNORMAL HIGH (ref 70–99)
Glucose-Capillary: 169 mg/dL — ABNORMAL HIGH (ref 70–99)
Glucose-Capillary: 180 mg/dL — ABNORMAL HIGH (ref 70–99)
Glucose-Capillary: 187 mg/dL — ABNORMAL HIGH (ref 70–99)
Glucose-Capillary: 194 mg/dL — ABNORMAL HIGH (ref 70–99)
Glucose-Capillary: 258 mg/dL — ABNORMAL HIGH (ref 70–99)
Glucose-Capillary: 266 mg/dL — ABNORMAL HIGH (ref 70–99)
Glucose-Capillary: 307 mg/dL — ABNORMAL HIGH (ref 70–99)
Glucose-Capillary: 335 mg/dL — ABNORMAL HIGH (ref 70–99)
Glucose-Capillary: 344 mg/dL — ABNORMAL HIGH (ref 70–99)
Glucose-Capillary: 36 mg/dL — CL (ref 70–99)
Glucose-Capillary: 37 mg/dL — CL (ref 70–99)
Glucose-Capillary: 388 mg/dL — ABNORMAL HIGH (ref 70–99)
Glucose-Capillary: 58 mg/dL — ABNORMAL LOW (ref 70–99)
Glucose-Capillary: 593 mg/dL (ref 70–99)
Glucose-Capillary: 595 mg/dL (ref 70–99)
Glucose-Capillary: 90 mg/dL (ref 70–99)
Glucose-Capillary: >600 mg/dL (ref 70–99)
Glucose-Capillary: >600 mg/dL (ref 70–99)

## 2022-07-30 LAB — BASIC METABOLIC PANEL
Anion gap: 8 (ref 5–15)
Anion gap: 11 (ref 5–15)
Anion gap: 6 (ref 5–15)
Anion gap: 5 (ref 5–15)
BUN: 60 mg/dL — ABNORMAL HIGH (ref 6–20)
BUN: 58 mg/dL — ABNORMAL HIGH (ref 6–20)
BUN: 52 mg/dL — ABNORMAL HIGH (ref 6–20)
BUN: 53 mg/dL — ABNORMAL HIGH (ref 6–20)
CO2: 21 mmol/L — ABNORMAL LOW (ref 22–32)
CO2: 18 mmol/L — ABNORMAL LOW (ref 22–32)
CO2: 21 mmol/L — ABNORMAL LOW (ref 22–32)
CO2: 22 mmol/L (ref 22–32)
Calcium: 8.3 mg/dL — ABNORMAL LOW (ref 8.9–10.3)
Calcium: 8 mg/dL — ABNORMAL LOW (ref 8.9–10.3)
Calcium: 8.1 mg/dL — ABNORMAL LOW (ref 8.9–10.3)
Calcium: 8.3 mg/dL — ABNORMAL LOW (ref 8.9–10.3)
Chloride: 103 mmol/L (ref 98–111)
Chloride: 99 mmol/L (ref 98–111)
Chloride: 105 mmol/L (ref 98–111)
Chloride: 109 mmol/L (ref 98–111)
Creatinine, Ser: 1.79 mg/dL — ABNORMAL HIGH (ref 0.44–1.00)
Creatinine, Ser: 1.83 mg/dL — ABNORMAL HIGH (ref 0.44–1.00)
Creatinine, Ser: 1.68 mg/dL — ABNORMAL HIGH (ref 0.44–1.00)
Creatinine, Ser: 1.65 mg/dL — ABNORMAL HIGH (ref 0.44–1.00)
GFR, Estimated: 38 mL/min — ABNORMAL LOW (ref >60)
GFR, Estimated: 37 mL/min — ABNORMAL LOW (ref >60)
GFR, Estimated: 41 mL/min — ABNORMAL LOW (ref >60)
GFR, Estimated: 42 mL/min — ABNORMAL LOW (ref >60)
Glucose, Bld: 501 mg/dL (ref 70–99)
Glucose, Bld: 752 mg/dL (ref 70–99)
Glucose, Bld: 338 mg/dL — ABNORMAL HIGH (ref 70–99)
Glucose, Bld: 207 mg/dL — ABNORMAL HIGH (ref 70–99)
Potassium: 6 mmol/L — ABNORMAL HIGH (ref 3.5–5.1)
Potassium: 6.1 mmol/L — ABNORMAL HIGH (ref 3.5–5.1)
Potassium: 6.2 mmol/L — ABNORMAL HIGH (ref 3.5–5.1)
Potassium: 4.6 mmol/L (ref 3.5–5.1)
Sodium: 132 mmol/L — ABNORMAL LOW (ref 135–145)
Sodium: 128 mmol/L — ABNORMAL LOW (ref 135–145)
Sodium: 132 mmol/L — ABNORMAL LOW (ref 135–145)
Sodium: 136 mmol/L (ref 135–145)

## 2022-07-30 LAB — MRSA NEXT GEN BY PCR, NASAL: MRSA by PCR Next Gen: NOT DETECTED

## 2022-07-30 LAB — POTASSIUM
Potassium: 4 mmol/L (ref 3.5–5.1)
Potassium: 4.6 mmol/L (ref 3.5–5.1)
Potassium: 5.8 mmol/L — ABNORMAL HIGH (ref 3.5–5.1)

## 2022-07-30 LAB — BETA-HYDROXYBUTYRIC ACID
Beta-Hydroxybutyric Acid: 0.85 mmol/L — ABNORMAL HIGH (ref 0.05–0.27)
Beta-Hydroxybutyric Acid: 0.14 mmol/L (ref 0.05–0.27)

## 2022-07-30 LAB — LIPASE, BLOOD: Lipase: 65 U/L — ABNORMAL HIGH (ref 11–51)

## 2022-07-30 MED ORDER — INSULIN ASPART 100 UNIT/ML IJ SOLN
20.0000 [IU] | Freq: Once | INTRAMUSCULAR | Status: AC
  Administered 2022-07-30: 20 [IU] via SUBCUTANEOUS
  Filled 2022-07-30: qty 1

## 2022-07-30 MED ORDER — SODIUM CHLORIDE 0.9 % IV SOLN: INTRAVENOUS | Status: DC

## 2022-07-30 MED ORDER — INSULIN ASPART 100 UNIT/ML IV SOLN
5.0000 [IU] | Freq: Once | INTRAVENOUS | Status: AC
  Administered 2022-07-30: 5 [IU] via INTRAVENOUS
  Filled 2022-07-30: qty 0.05

## 2022-07-30 MED ORDER — CHLORHEXIDINE GLUCONATE CLOTH 2 % EX PADS
6.0000 | MEDICATED_PAD | Freq: Every day | CUTANEOUS | Status: DC
  Administered 2022-07-30 – 2022-08-05 (×5): 6 via TOPICAL

## 2022-07-30 MED ORDER — INSULIN ASPART 100 UNIT/ML IJ SOLN
0.0000 [IU] | INTRAMUSCULAR | Status: DC
  Administered 2022-07-30: 1 [IU] via SUBCUTANEOUS
  Administered 2022-07-30: 4 [IU] via SUBCUTANEOUS
  Administered 2022-07-31: 1 [IU] via SUBCUTANEOUS
  Administered 2022-07-31: 3 [IU] via SUBCUTANEOUS
  Administered 2022-07-31: 1 [IU] via SUBCUTANEOUS
  Administered 2022-07-31: 2 [IU] via SUBCUTANEOUS
  Administered 2022-08-01: 5 [IU] via SUBCUTANEOUS
  Administered 2022-08-01: 4 [IU] via SUBCUTANEOUS
  Filled 2022-07-30 (×8): qty 1

## 2022-07-30 MED ORDER — LACTATED RINGERS IV SOLN: INTRAVENOUS | Status: DC

## 2022-07-30 MED ORDER — DEXTROSE 50 % IV SOLN
25.0000 g | INTRAVENOUS | Status: AC
  Administered 2022-07-30: 25 g via INTRAVENOUS

## 2022-07-30 MED ORDER — DEXTROSE 50 % IV SOLN: 0.0000 mL | INTRAVENOUS | Status: DC | PRN

## 2022-07-30 MED ORDER — SODIUM CHLORIDE 0.9 % IV BOLUS
500.0000 mL | Freq: Once | INTRAVENOUS | Status: AC
  Administered 2022-07-30: 500 mL via INTRAVENOUS

## 2022-07-30 MED ORDER — INSULIN ASPART 100 UNIT/ML IV SOLN
10.0000 [IU] | Freq: Once | INTRAVENOUS | Status: AC
  Administered 2022-07-30: 10 [IU] via INTRAVENOUS
  Filled 2022-07-30: qty 0.1

## 2022-07-30 MED ORDER — DEXTROSE-NACL 5-0.9 % IV SOLN: INTRAVENOUS | Status: DC

## 2022-07-30 MED ORDER — DEXTROSE IN LACTATED RINGERS 5 % IV SOLN: INTRAVENOUS | Status: DC

## 2022-07-30 MED ORDER — SODIUM ZIRCONIUM CYCLOSILICATE 5 G PO PACK
10.0000 g | PACK | Freq: Two times a day (BID) | ORAL | Status: AC
  Administered 2022-07-30 (×2): 10 g via ORAL
  Filled 2022-07-30: qty 1
  Filled 2022-07-30: qty 2

## 2022-07-30 MED ORDER — INSULIN GLARGINE-YFGN 100 UNIT/ML ~~LOC~~ SOLN
2.0000 [IU] | Freq: Every day | SUBCUTANEOUS | Status: DC
  Administered 2022-07-30 – 2022-07-31 (×2): 2 [IU] via SUBCUTANEOUS
  Filled 2022-07-30 (×3): qty 0.02

## 2022-07-30 MED ORDER — INSULIN REGULAR(HUMAN) IN NACL 100-0.9 UT/100ML-% IV SOLN
INTRAVENOUS | Status: DC
  Administered 2022-07-30: 4.6 [IU]/h via INTRAVENOUS
  Filled 2022-07-30 (×2): qty 100

## 2022-07-30 MED ORDER — GLUCERNA 1.5 CAL PO LIQD
1000.0000 mL | ORAL | Status: DC
  Administered 2022-07-30 – 2022-08-01 (×3): 1000 mL

## 2022-07-30 NOTE — Progress Notes (Signed)
       CROSS COVER NOTE  NAME: Ashley Camacho MRN: [id number] DOB : 1990/09/08    HPI/Events of Note   Report: Nurse reports critically high blood glucose and hyperkalemia On review of chart:    Assessment and  Interventions   Assessment: Despite IV insulin dose and hyperkalmeia treatment, blood glucose continues to worsen and renal function declining as well  Beta hydrox not significantly elevated  Plan: Stat vbg   Serum osmo En[place] tool for hyperglycemia - may need changed to hyperosmolar state depending on above results Transfer to stepdown  Stop tube feeding Npo Lipase added to labs 20 units SQ insulin given for treatment whil waiting for stepdown bed Start NS at [place] NP [facility]

## 2022-07-30 NOTE — Progress Notes (Signed)
Received critical Glucose results from Lab. Glucose 501. Anne Ng notified and orders received. Finger stick glucose checked times 2 was 595 and then HI. Potassium was 6. VSS. Pt alert and oriented.

## 2022-07-30 NOTE — Progress Notes (Signed)
PROGRESS NOTE    Ashley Camacho   [id number] DOB: 1990-07-16  DOA: 07/27/2022 Date of Service: 07/30/22 PCP: Merryl Hacker, No     Brief Narrative / Hospital Course:  [name] is a 32 year old female with history of hypertension, insulin-dependent diabetes mellitus, hypothyroid, history of hypertension, CKD stage IIIb, adrenal insufficiency, hypothyroid, major depressive disorder, presence of jejunostomy tube, protein calorie malnutrition, who presents emergency department from home via EMS on 07/27/2022 for chief concerns of hypoglycemia. Jejunostomy tube placed 03/04, reportedly had tube placed due to failure to eat. There was a concern that patient went to take a nap at 10 AM and then she went up to wake up and get a snack and she passed out. Family were concerned that she did hit her head but no obvious trauma noted Patient was significantly altered per EMS sugars were in the 30s and patient was given some D10 and sugars increased but patient is was altered on arrival to ED. She reports she did not take her insulin on date of arrival to ED. She last used insulin 07/26/22 evening. She states her blood glucose was 264 that night.  03/05: admitted to hospitalist service late evening 03/06-03/07: hypoglycemic in AM and hyperglycemic in afternoon, transitioned to q4h CBG checks, dietary following. Restarting tube feeds qhs and basal insulin daily vs home bid dose. AKI treating w/ fluids.  03/07: hyperkalemic, can't safely give lasix d/t renal fxn, can't safely give albuterol d/t prolonged QT hx, can't safeyl give kayexalate w/ bowel dysfunction - ordered another insulin + dextrose and bolus NS, renal fxn improving somewhat. K improving into the afternoon.  03/08: overnight hyperglycemic  up to 752 and K increasing to 6.1, not responding to sq insulin, stopped tube feeds and sent to stepdown for insulin gtt. Lipase slight elevated, Osm high. VBG no acidosis, started on drip then significant  hypoglycemia to 37. Stopped gtt now checking Glc hourly.     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 to consult w/ endocrinology, awaiting call back.  Ideally would like her to get continuous blood glucose monitor, will d/w diabetes coordinator Consider restart tube feeds w/ glucerna    Hyperkalemia - resolved Caution w/ lasix d/t renal fxn, caution w/ albuterol d/t prolonged QT hx, caution w/ kayexalate d/t bowel dysfunction  additional insulin + dextrose and IV fluids as needed  Closely monitor K  Telemetry   Severe gastroparesis limiting po intake  Jejunostomy feeding tube in place Appreciate dietary recs Plan for patient to have tube feeds qhs and po intake during the day as able  Anemia of chronic disease Hemoglobin is 8.4, her baseline is 9.0-9.1 over the last 3 weeks No indication for blood transfusion at this time and  patient denies any clinical signs of blood loss   Acute kidney injury superimposed on chronic kidney disease stage 3b (HCC)  fluids, NS 125 ml/hr Follow BMP    Adrenal insufficiency (HCC) home Solu-Cortef 10-20 mg per home dosing 20 mg in the morning, 10 mg at night   Hypothyroidism TSH WNL 3.09 Levothyroxine 50 mcg daily    Insomnia Melatonin 5 mg nightly as needed   Essential hypertension Amlodipine 5 mg daily resumed   Anxiety Resumed home fluoxetine 10 mg daily   Prolonged QT avoid scheduled QT prolonging agents Holding home mirtazapine     DVT prophylaxis: heparin - pt has declined  Pertinent IV fluids/nutrition: see above Central lines / invasive devices: jejunostomy tube   Code Status: FULL CODE   Current Admission Status: observation   TOC needs / Dispo plan: anticipate d/t to prevous home environment, will need tube feed supplies Barriers to discharge / significant pending items: glc stability, dietary recs, titrating tube feeds, hypo/hyperglycemia, hypoerkalemia               Subjective / Brief ROS:  Patient reports feeling okay this morning, tired.  No nausea Denies CP/SOB.  Pain controlled.  Denies new weakness.   Family Communication: none at this time    Objective Findings:  Vitals:   07/30/22 0839 07/30/22 0857 07/30/22 0900 07/30/22 1000  BP: (!) 112/57 128/73 136/71 (!) 108/58  Pulse: 94  89 78  Resp: '18 12 15 14  '$ Temp: 98.2 F (36.8 C) 98 F (36.7 C)    TempSrc:  Oral    SpO2: 100% 100%    Weight:       No intake or output data in the 24 hours ending 07/30/22 1220 Filed Weights   07/29/22 0253 07/30/22 0500  Weight: 43.3 kg 43.9 kg    Examination:  Physical Exam Constitutional:      General: She is not in acute distress.    Appearance: Normal appearance. She is ill-appearing.  Cardiovascular:     Rate and Rhythm: Normal rate and regular rhythm.     Heart sounds: Normal heart sounds.  Pulmonary:     Effort: Pulmonary effort is normal.     Breath sounds: Normal breath sounds.  Abdominal:     General: Abdomen is flat. Bowel sounds are normal.     Palpations: Abdomen is soft.  Skin:    General: Skin is warm and dry.  Neurological:     General: No focal deficit present.     Mental Status: She is alert and oriented to person, place, and  time.  Psychiatric:        Mood and Affect: Mood normal.        Behavior: Behavior normal.          Scheduled Medications:   amLODipine  5 mg Oral Daily   Chlorhexidine Gluconate Cloth  6 each Topical Daily   feeding supplement (GLUCERNA SHAKE)  237 mL Oral TID BM   ferrous sulfate  325 mg Oral Q breakfast   FLUoxetine  10 mg Oral Daily   heparin  5,000 Units Subcutaneous Q8H   hydrocortisone  20 mg Oral Q breakfast   And   hydrocortisone  10 mg Oral Q supper   levothyroxine  50 mcg Oral Q0600   multivitamin with minerals  1 tablet Oral Daily   sodium zirconium cyclosilicate  10 g Oral BID    Continuous Infusions:  sodium chloride 125 mL/hr at 07/30/22 0629   dextrose  5 % and 0.9% NaCl     feeding supplement (GLUCERNA 1.5 CAL)     insulin 4.6 Units/hr (07/30/22 0917)    PRN Medications:  acetaminophen **OR** acetaminophen, dextrose, hydrALAZINE, melatonin, ondansetron **OR** ondansetron (ZOFRAN) IV, mouth rinse, senna-docusate  Antimicrobials from admission:  Anti-infectives (From admission, onward)    None           Data Reviewed:  I have personally reviewed the following...  CBC: Recent Labs  Lab 07/27/22 1733 07/28/22 0447 07/29/22 0532  WBC 4.8 5.1 8.0  NEUTROABS 3.0  --   --   HGB 8.4* 8.7* 8.6*  HCT 24.4* 24.6* 24.5*  MCV 87.8 85.1 84.8  PLT 178 193 [id number]   Basic Metabolic Panel: Recent Labs  Lab 07/27/22 1733 07/27/22 2214 07/28/22 0447 07/29/22 0532 07/29/22 1200 07/29/22 1415 07/29/22 1800 07/29/22 2223 07/30/22 0210 07/30/22 0445 07/30/22 0937  NA 137  --  137 133* 131*  --   --   --  132* 128*  --   K 4.0  --  4.9 5.9* 6.5*   < > 5.3* 5.8* 6.0* 6.1* 4.0  CL 107  --  107 101 103  --   --   --  103 99  --   CO2 24  --  23 24 21*  --   --   --  21* 18*  --   GLUCOSE 90  --  125* 101* 307*  --   --   --  501* 752*  --   BUN 51*  --  49* 66* 66*  --   --   --  60* 58*  --   CREATININE 1.57*  --  1.68* 2.19* 2.08*  --   --   --   1.79* 1.83*  --   CALCIUM 8.6*  --  8.5* 8.8* 8.4*  --   --   --  8.3* 8.0*  --   MG 1.9  --   --   --   --   --   --   --   --   --   --   PHOS  --  3.8  --   --   --   --   --   --   --   --   --    < > = values in this interval not displayed.   GFR: Estimated Creatinine Clearance: 30.9 mL/min (A) (by C-G formula based on SCr of 1.83 mg/dL (H)). Liver Function Tests: Recent Labs  Lab 07/27/22 1733  AST 29  ALT 18  ALKPHOS 84  BILITOT 0.5  PROT 6.8  ALBUMIN 3.6   Recent Labs  Lab 07/30/22 0740  LIPASE 65*   No results for input(s): "AMMONIA" in the last 168 hours. Coagulation Profile: No results for input(s): "INR", "PROTIME" in the last 168 hours. Cardiac Enzymes: No results for input(s): "CKTOTAL", "CKMB", "CKMBINDEX", "TROPONINI" in the last 168 hours. BNP (last 3 results) No results for input(s): "PROBNP" in the last 8760 hours. HbA1C: No results for input(s): "HGBA1C" in the last 72 hours. CBG: Recent Labs  Lab 07/30/22 0651 07/30/22 0858 07/30/22 1032 07/30/22 1102 07/30/22 1129  GLUCAP 593* 344* 90 37* 157*   Lipid Profile: No results for input(s): "CHOL", "HDL", "LDLCALC", "TRIG", "CHOLHDL", "LDLDIRECT" in the last 72 hours. Thyroid Function Tests: Recent Labs    07/27/22 2214  TSH 3.091   Anemia Panel: Recent Labs    07/27/22 1734 07/27/22 1947 07/27/22 2214  VITAMINB12  --   --  623  FOLATE  --  18.5  --   FERRITIN  --  952*  --   TIBC  --  160*  --   IRON  --  74  --   RETICCTPCT 2.1  --   --    Most Recent Urinalysis On File:     Component Value Date/Time   COLORURINE YELLOW (A) 07/27/2022 1825   APPEARANCEUR CLEAR (A) 07/27/2022 1825   APPEARANCEUR Clear 09/18/2020 1153   LABSPEC 1.015 07/27/2022 1825   LABSPEC 1.026 03/01/2013 1844   PHURINE 5.0 07/27/2022 1825   GLUCOSEU 50 (A) 07/27/2022 1825   GLUCOSEU >=500 03/01/2013 1844   HGBUR SMALL (A) 07/27/2022 1825   BILIRUBINUR NEGATIVE 07/27/2022 1825   BILIRUBINUR neg  10/23/2020 0953   BILIRUBINUR Negative 09/18/2020 1153   BILIRUBINUR Negative 03/01/2013 1844   KETONESUR NEGATIVE 07/27/2022 1825   PROTEINUR 100 (A) 07/27/2022 1825   UROBILINOGEN 0.2 10/23/2020 0953   NITRITE NEGATIVE 07/27/2022 1825   LEUKOCYTESUR NEGATIVE 07/27/2022 1825   LEUKOCYTESUR Negative 03/01/2013 1844   Sepsis Labs: '@LABRCNTIP'$ (procalcitonin:4,lacticidven:4) Microbiology: Recent Results (from the past 240 hour(s))  Resp panel by RT-PCR (RSV, Flu A&B, Covid) Anterior Nasal Swab     Status: None   Collection Time: 07/27/22  5:34 PM   Specimen: Anterior Nasal Swab  Result Value Ref Range Status   SARS Coronavirus 2 by RT PCR NEGATIVE NEGATIVE Final    Comment: (NOTE) SARS-CoV-2 target nucleic acids are NOT DETECTED.  The SARS-CoV-2 RNA is generally detectable in upper respiratory specimens during the acute phase of infection. The lowest concentration of SARS-CoV-2 viral copies this assay can detect is 138 copies/mL. A negative result does not preclude SARS-Cov-2 infection and should not be used as the sole basis for treatment or other patient management decisions. A negative result may occur with  improper specimen collection/handling, submission of specimen other than nasopharyngeal swab, presence of viral mutation(s) within the areas targeted by this assay, and inadequate number of viral copies(<138 copies/mL). A negative result must be combined with clinical observations, patient history, and epidemiological information. The expected result is Negative.  Fact Sheet for Patients:  EntrepreneurPulse.com.au  Fact Sheet for Healthcare Providers:  IncredibleEmployment.be  This test is no t yet approved or cleared by the [place] FDA and  has been authorized for detection and/or diagnosis of SARS-CoV-2 by FDA under an Emergency Use Authorization (EUA). This EUA will remain  in effect (meaning this test can be used) for the  duration of the COVID-19 declaration under Section 564(b)(1) of the Act, 21 U.S.C.section 360bbb-3(b)(1), unless the authorization is terminated  or revoked sooner.       Influenza A by PCR NEGATIVE NEGATIVE Final   Influenza B by PCR NEGATIVE NEGATIVE Final    Comment: (NOTE) The Xpert Xpress SARS-CoV-2/FLU/RSV plus assay is intended as an aid in the diagnosis of influenza from Nasopharyngeal swab specimens and should not be used as a sole basis for treatment. Nasal washings and aspirates are unacceptable for Xpert Xpress SARS-CoV-2/FLU/RSV testing.  Fact Sheet for Patients: EntrepreneurPulse.com.au  Fact Sheet for Healthcare Providers: IncredibleEmployment.be  This test is not yet approved or cleared by the [place] FDA and has been authorized for detection and/or diagnosis of SARS-CoV-2 by FDA under an Emergency Use Authorization (EUA). This EUA will remain in effect (meaning this test can be used) for the duration of the COVID-19 declaration under Section 564(b)(1) of the Act, 21 U.S.C. section  360bbb-3(b)(1), unless the authorization is terminated or revoked.     Resp Syncytial Virus by PCR NEGATIVE NEGATIVE Final    Comment: (NOTE) Fact Sheet for Patients: EntrepreneurPulse.com.au  Fact Sheet for Healthcare Providers: IncredibleEmployment.be  This test is not yet approved or cleared by the [place] FDA and has been authorized for detection and/or diagnosis of SARS-CoV-2 by FDA under an Emergency Use Authorization (EUA). This EUA will remain in effect (meaning this test can be used) for the duration of the COVID-19 declaration under Section 564(b)(1) of the Act, 21 U.S.C. section 360bbb-3(b)(1), unless the authorization is terminated or revoked.  Performed at [facility], [address] [place], [place] [address]   MRSA Next Gen by PCR, Nasal     Status: None    Collection Time: 07/30/22  8:59 AM   Specimen: Nasal Mucosa; Nasal Swab  Result Value Ref Range Status   MRSA by PCR Next Gen NOT DETECTED NOT DETECTED Final    Comment: (NOTE) The GeneXpert MRSA Assay (FDA approved for NASAL specimens only), is one component of a comprehensive MRSA colonization surveillance program. It is not intended to diagnose MRSA infection nor to guide or monitor treatment for MRSA infections. Test performance is not FDA approved in patients less than 44 years old. Performed at [facility], [address], [place], [place] [address]       Radiology Studies last 3 days: CT ANGIO HEAD NECK W WO CM  Result Date: 07/27/2022 CLINICAL DATA:  Collapsed earlier today. EXAM: CT ANGIOGRAPHY HEAD AND NECK TECHNIQUE: Multidetector CT imaging of the head and neck was performed using the standard protocol during bolus administration of intravenous contrast. Multiplanar CT image reconstructions and MIPs were obtained to evaluate the vascular anatomy. Carotid stenosis measurements (when applicable) are obtained utilizing NASCET criteria, using the distal internal carotid diameter as the denominator. RADIATION DOSE REDUCTION: This exam was performed according to the departmental dose-optimization program which includes automated exposure control, adjustment of the mA and/or kV according to patient size and/or use of iterative reconstruction technique. CONTRAST:  44m OMNIPAQUE IOHEXOL 350 MG/ML SOLN COMPARISON:  Same-day noncontrast CT head FINDINGS: CTA NECK FINDINGS Aortic arch: The imaged aortic arch is normal. The origins of the major branch vessels are patent. The subclavian arteries are patent to the level imaged. Right carotid system: The right common, internal, and external carotid arteries are patent, without stenosis or occlusion. There is no evidence of dissection or aneurysm. Left carotid system: The left common, internal, and external carotid arteries are patent,  without stenosis or occlusion. There is no evidence of dissection or aneurysm. Vertebral arteries: The vertebral arteries are patent, without hemodynamically significant stenosis or occlusion. There is no evidence of dissection or aneurysm. Skeleton: There is no acute osseous abnormality or suspicious osseous lesion. There is no visible canal hematoma. Other neck: Soft tissues of the neck are unremarkable. Upper chest: The imaged lung apices are clear. Review of the MIP images confirms the above findings CTA HEAD FINDINGS Anterior circulation: The intracranial ICAs are normal. The bilateral MCAs are normal. The bilateral ACAs are normal. The anterior communicating artery is normal There is no aneurysm or AVM. Posterior circulation: The bilateral V4 segments are normal. There is fenestration in the proximal basilar artery, a normal variant. The basilar artery is otherwise normal. The major cerebellar arteries appear normal. The bilateral PCAs are normal. Bilateral posterior communicating arteries are identified. There is no aneurysm or AVM. Venous sinuses: Patent. Anatomic variants: As above.  Review of the MIP images confirms the above findings IMPRESSION: Normal vasculature of the head and neck. Electronically Signed   By: Valetta Mole M.D.   On: 07/27/2022 19:24   DG Chest Portable 1 View  Result Date: 07/27/2022 CLINICAL DATA:  Syncope. EXAM: PORTABLE CHEST 1 VIEW COMPARISON:  AP chest 06/09/2022 FINDINGS: Cardiac silhouette and mediastinal contours are within normal limits. The lungs are clear. No pleural effusion or pneumothorax. No acute skeletal abnormality. IMPRESSION: No active disease. Electronically Signed   By: Yvonne Kendall M.D.   On: 07/27/2022 17:01   CT HEAD WO CONTRAST (5MM)  Result Date: 07/27/2022 CLINICAL DATA:  Head trauma, abnormal mental status (Age 5-64y); Ataxia, cervical trauma EXAM: CT HEAD WITHOUT CONTRAST CT CERVICAL SPINE WITHOUT CONTRAST TECHNIQUE: Multidetector CT imaging of the  head and cervical spine was performed following the standard protocol without intravenous contrast. Multiplanar CT image reconstructions of the cervical spine were also generated. RADIATION DOSE REDUCTION: This exam was performed according to the departmental dose-optimization program which includes automated exposure control, adjustment of the mA and/or kV according to patient size and/or use of iterative reconstruction technique. COMPARISON:  None Available. FINDINGS: CT HEAD FINDINGS Brain: No evidence of acute infarction, hemorrhage, hydrocephalus, extra-axial collection or mass lesion/mass effect. Vascular: No hyperdense vessel identified. Skull: No acute fracture. Sinuses/Orbits: Clear sinuses.  No acute orbital findings. Other: No mastoid effusions. CT CERVICAL SPINE FINDINGS Alignment: Straightening.  No substantial sagittal subluxation. Skull base and vertebrae: Vertebral body heights are maintained. No evidence of acute fracture. Soft tissues and spinal canal: No prevertebral fluid or swelling. No visible canal hematoma. Disc levels:  No significant focal bony degenerative change. Upper chest: Visualized lung apices are clear. IMPRESSION: No acute intracranial or cervical spine findings. Electronically Signed   By: Margaretha Sheffield M.D.   On: 07/27/2022 16:56   CT Cervical Spine Wo Contrast  Result Date: 07/27/2022 CLINICAL DATA:  Head trauma, abnormal mental status (Age 66-64y); Ataxia, cervical trauma EXAM: CT HEAD WITHOUT CONTRAST CT CERVICAL SPINE WITHOUT CONTRAST TECHNIQUE: Multidetector CT imaging of the head and cervical spine was performed following the standard protocol without intravenous contrast. Multiplanar CT image reconstructions of the cervical spine were also generated. RADIATION DOSE REDUCTION: This exam was performed according to the departmental dose-optimization program which includes automated exposure control, adjustment of the mA and/or kV according to patient size and/or use of  iterative reconstruction technique. COMPARISON:  None Available. FINDINGS: CT HEAD FINDINGS Brain: No evidence of acute infarction, hemorrhage, hydrocephalus, extra-axial collection or mass lesion/mass effect. Vascular: No hyperdense vessel identified. Skull: No acute fracture. Sinuses/Orbits: Clear sinuses.  No acute orbital findings. Other: No mastoid effusions. CT CERVICAL SPINE FINDINGS Alignment: Straightening.  No substantial sagittal subluxation. Skull base and vertebrae: Vertebral body heights are maintained. No evidence of acute fracture. Soft tissues and spinal canal: No prevertebral fluid or swelling. No visible canal hematoma. Disc levels:  No significant focal bony degenerative change. Upper chest: Visualized lung apices are clear. IMPRESSION: No acute intracranial or cervical spine findings. Electronically Signed   By: Margaretha Sheffield M.D.   On: 07/27/2022 16:56             LOS: 2 days    Time spent 50 min    Emeterio Reeve, DO [facility] 07/30/2022, 12:20 PM    Dictation software may have been used to generate the above note. Typos may occur and escape review in typed/dictated notes. Please contact Dr Sheppard Coil directly for clarity if needed.  Staff may message me via secure chat in [place]  but this may not receive an immediate response,  please page me for urgent matters!  If 7PM-7AM, please contact night coverage [email or web address]

## 2022-07-30 NOTE — Progress Notes (Signed)
Nutrition Follow-up  DOCUMENTATION CODES:   Underweight  INTERVENTION:   -Transition to continuous TF via j-tube as pt is now NPO:  Initiate Glucerna 1.5 @ 20 ml/hr and increase by 10 ml every 4 hours to goal rate of 50 ml/hr.   30 ml free water flush every 4 hours   Tube feeding regimen provides 1800 kcal (100% of needs), 99 grams of protein, and 911 ml of H2O.  Total free water: 969 ml daily  NUTRITION DIAGNOSIS:   Inadequate oral intake related to nausea, vomiting, poor appetite as evidenced by per patient/family report.  Ongoing  GOAL:   Patient will meet greater than or equal to 90% of their needs  Progressing   MONITOR:   PO intake, Supplement acceptance, TF tolerance  REASON FOR ASSESSMENT:   Consult Assessment of nutrition requirement/status  ASSESSMENT:   Pt with history of hypertension, insulin-dependent diabetes mellitus, hypothyroid, history of hypertension, CKD stage IIIb, adrenal insufficiency, hypothyroid, major depressive disorder, presence of jejunostomy tube, protein calorie malnutrition, who presents for chief concerns of hypoglycemia.  Reviewed I/O's: +120 ml x 24 hours and +2 L since admission   Noted pt with hyperglycemic episodes overnight. TF has been stopped. Pt has been transitioned to ICU due to need for insulin drip.   Case discussed with Dr. Sheppard Coil. Plan to try to have endocrinology consult in-house. Would like to try DM friendly formula to see if this has any impact on blood sugar. Will transition to continuous feedings as pt is now NPO.   Medications reviewed and include ferrous sulfate, cortef, lokelma, and 0.9% sodium chloride infusion @ 125 ml/hr.   Labs reviewed: Na: 128, CBGS: 37-90 (inpatient orders for glycemic control are IV insulin drip).    Diet Order:   Diet Order             Diet NPO time specified Except for: Ice Chips, Sips with Meds  Diet effective now                   EDUCATION NEEDS:   Education  needs have been addressed  Skin:  Skin Assessment: Reviewed RN Assessment  Last BM:  07/27/22  Height:   Ht Readings from Last 1 Encounters:  07/26/22 '5\' 1"'$  (1.549 m)    Weight:   Wt Readings from Last 1 Encounters:  07/30/22 43.9 kg    Ideal Body Weight:  47.7 kg  BMI:  Body mass index is 18.29 kg/m.  Estimated Nutritional Needs:   Kcal:  1700-1900  Protein:  85-100 grams  Fluid:  > 1.7 L    Loistine Chance, RD, LDN, [place] Registered Dietitian II Certified Diabetes Care and Education Specialist Please refer to [facility] for RD and/or RD on-call/weekend/after hours pager

## 2022-07-30 NOTE — Progress Notes (Signed)
Critical results received from lab. Pt's glucose was 752. NP notified. Glucose to be rechecked at 0545. No new orders at this time.

## 2022-07-30 NOTE — Progress Notes (Signed)
Notification given from NP pt would be transferring to ICU for insulin drip. NS initiated at 125 via 20G in Right AC. Glucose recheck was 593. Waiting for bed placement. Pt has been informed of transfer.

## 2022-07-30 NOTE — Inpatient Diabetes Management (Signed)
Inpatient Diabetes Program Recommendations  AACE/ADA: New Consensus Statement on Inpatient Glycemic Control (2015)  Target Ranges:  Prepandial:   less than 140 mg/dL      Peak postprandial:   less than 180 mg/dL (1-2 hours)      Critically ill patients:  140 - 180 mg/dL   Lab Results  Component Value Date   GLUCAP 344 (H) 07/30/2022   HGBA1C 10.2 (H) 02/28/2022    Latest Reference Range & Units 07/29/22 21:08 07/30/22 00:31 07/30/22 03:53 07/30/22 03:57 07/30/22 05:51 07/30/22 06:51 07/30/22 08:58  Glucose-Capillary 70 - 99 mg/dL 147 (H) 388 (H) Novolog 5 units 595 (HH)  >600 (HH) >600 (HH) Novolog 10 units 593 (HH) Novolog 20 units 344 (H) Novolog 6 units  (HH): Data is critically high (H): Data is abnormally high   Diabetes history: DM1 (does NOT make any insulin; requires basal, correction, and carb coverage insulin; very sensitive to insulin) Outpatient Diabetes medications: Basaglar 4 units BID, Novolog 2 units TID with meals plus correction Current orders for Inpatient glycemic control: IV insulin, Cortef 20 mg QAM, Cortef 10 mg QPM    Inpatient Diabetes Program Recommendations:   Noted patient received total of 36 units Novolog over 3 hrs time Anticipate hypoglycemia due to patient sensitive to insulin.   When patient transitions back to [place] insulin, please consider: -Semglee 4 units q 12 hrs. -Novolog 2 units tid meal coverage if eats 50% -Novolog 0-6 units q 4 hrs. correction  Thank you, Nani Gasser. Annel Zunker, RN, MSN, CDE  Diabetes Coordinator Inpatient Glycemic Control Team Team Pager [phone] (8am-5pm) 07/30/2022 9:54 AM

## 2022-07-30 NOTE — Progress Notes (Signed)
Glucose remains HI . Order received to give 20 Novolog SQ . Insulin given. And TF stopped. Pt is alert and oriented. VSS.

## 2022-07-31 DIAGNOSIS — E162 Hypoglycemia, unspecified: Secondary | ICD-10-CM | POA: Diagnosis not present

## 2022-07-31 LAB — POTASSIUM
Potassium: 5 mmol/L (ref 3.5–5.1)
Potassium: 4.9 mmol/L (ref 3.5–5.1)
Potassium: 4.7 mmol/L (ref 3.5–5.1)
Potassium: 6 mmol/L — ABNORMAL HIGH (ref 3.5–5.1)
Potassium: 6.1 mmol/L — ABNORMAL HIGH (ref 3.5–5.1)

## 2022-07-31 LAB — GLUCOSE, CAPILLARY
Glucose-Capillary: 116 mg/dL — ABNORMAL HIGH (ref 70–99)
Glucose-Capillary: 150 mg/dL — ABNORMAL HIGH (ref 70–99)
Glucose-Capillary: 162 mg/dL — ABNORMAL HIGH (ref 70–99)
Glucose-Capillary: 170 mg/dL — ABNORMAL HIGH (ref 70–99)
Glucose-Capillary: 180 mg/dL — ABNORMAL HIGH (ref 70–99)
Glucose-Capillary: 215 mg/dL — ABNORMAL HIGH (ref 70–99)
Glucose-Capillary: 250 mg/dL — ABNORMAL HIGH (ref 70–99)
Glucose-Capillary: 286 mg/dL — ABNORMAL HIGH (ref 70–99)
Glucose-Capillary: 346 mg/dL — ABNORMAL HIGH (ref 70–99)
Glucose-Capillary: 388 mg/dL — ABNORMAL HIGH (ref 70–99)
Glucose-Capillary: 463 mg/dL — ABNORMAL HIGH (ref 70–99)
Glucose-Capillary: 92 mg/dL (ref 70–99)

## 2022-07-31 LAB — BASIC METABOLIC PANEL
Anion gap: 7 (ref 5–15)
BUN: 47 mg/dL — ABNORMAL HIGH (ref 6–20)
CO2: 22 mmol/L (ref 22–32)
Calcium: 8.5 mg/dL — ABNORMAL LOW (ref 8.9–10.3)
Chloride: 109 mmol/L (ref 98–111)
Creatinine, Ser: 1.53 mg/dL — ABNORMAL HIGH (ref 0.44–1.00)
GFR, Estimated: 46 mL/min — ABNORMAL LOW (ref >60)
Glucose, Bld: 124 mg/dL — ABNORMAL HIGH (ref 70–99)
Potassium: 5 mmol/L (ref 3.5–5.1)
Sodium: 138 mmol/L (ref 135–145)

## 2022-07-31 MED ORDER — INSULIN ASPART 100 UNIT/ML IJ SOLN
7.0000 [IU] | Freq: Once | INTRAMUSCULAR | Status: AC
  Administered 2022-07-31: 7 [IU] via SUBCUTANEOUS
  Filled 2022-07-31: qty 1

## 2022-07-31 MED ORDER — DIVALPROEX SODIUM ER 250 MG PO TB24
250.0000 mg | ORAL_TABLET | Freq: Every day | ORAL | Status: DC
  Administered 2022-07-31 – 2022-08-05 (×6): 250 mg via ORAL
  Filled 2022-07-31 (×6): qty 1

## 2022-07-31 MED ORDER — SODIUM CHLORIDE 0.9 % IV BOLUS
500.0000 mL | Freq: Once | INTRAVENOUS | Status: AC
  Administered 2022-07-31: 500 mL via INTRAVENOUS

## 2022-07-31 MED ORDER — TRAMADOL HCL 50 MG PO TABS
50.0000 mg | ORAL_TABLET | Freq: Once | ORAL | Status: AC
  Administered 2022-07-31: 50 mg via ORAL
  Filled 2022-07-31: qty 1

## 2022-07-31 MED ORDER — BUTALBITAL-APAP-CAFFEINE 50-325-40 MG PO TABS
1.0000 | ORAL_TABLET | Freq: Once | ORAL | Status: AC
  Administered 2022-07-31: 1 via ORAL
  Filled 2022-07-31: qty 1

## 2022-07-31 MED ORDER — INSULIN ASPART 100 UNIT/ML IJ SOLN: 5.0000 [IU] | Freq: Once | INTRAMUSCULAR | Status: DC

## 2022-07-31 MED ORDER — INSULIN ASPART 100 UNIT/ML IJ SOLN
8.0000 [IU] | Freq: Once | INTRAMUSCULAR | Status: AC
  Administered 2022-07-31: 8 [IU] via SUBCUTANEOUS
  Filled 2022-07-31: qty 1

## 2022-07-31 MED ORDER — INSULIN ASPART 100 UNIT/ML IJ SOLN
5.0000 [IU] | Freq: Once | INTRAMUSCULAR | Status: AC
  Administered 2022-07-31: 5 [IU] via INTRAVENOUS
  Filled 2022-07-31 (×2): qty 0.05

## 2022-07-31 MED ORDER — INSULIN ASPART 100 UNIT/ML IJ SOLN
3.0000 [IU] | Freq: Three times a day (TID) | INTRAMUSCULAR | Status: DC
  Administered 2022-08-01 (×2): 3 [IU] via SUBCUTANEOUS
  Filled 2022-07-31 (×2): qty 1

## 2022-07-31 NOTE — Progress Notes (Signed)
       CROSS COVER NOTE  NAME: GEET HOSKING MRN: [id number] DOB : 12/01/1990    HPI/Events of Note   Report: K 6.1, up from 6  On review of chart: Hyperkalemia Caution w/ lasix d/t renal fxn, caution w/ albuterol d/t prolonged QT hx, caution w/ kayexalate d/t bowel dysfunction  additional insulin + dextrose and IV fluids as needed  Closely monitor K  Telemetry    Assessment and  Interventions   Assessment: Cbg 365 Plan: insulin 5 units IV 500 cc NS bolus Continue tele monitoring Repeat K every 4 hours       Kathlene Cote NP [facility]

## 2022-07-31 NOTE — Progress Notes (Signed)
PROGRESS NOTE    Ashley Camacho   [id number] DOB: [birth year]  DOA: 07/27/2022 Date of Service: 07/31/22 PCP: Merryl Hacker, No     Brief Narrative / Hospital Course:  [name] is a 32 year old female with history of hypertension, insulin-dependent diabetes mellitus, hypothyroid, history of hypertension, CKD stage IIIb, adrenal insufficiency, hypothyroid, major depressive disorder, presence of jejunostomy tube, protein calorie malnutrition, who presents emergency department from home via EMS on 07/27/2022 for chief concerns of hypoglycemia. Jejunostomy tube placed 03/04, reportedly had tube placed due to failure to eat. There was a concern that patient went to take a nap at 10 AM and then she went up to wake up and get a snack and she passed out. Family were concerned that she did hit her head but no obvious trauma noted Patient was significantly altered per EMS sugars were in the 30s and patient was given some D10 and sugars increased but patient is was altered on arrival to ED. She reports she did not take her insulin on date of arrival to ED. She last used insulin 07/26/22 evening. She states her blood glucose was 264 that night.  03/05: admitted to hospitalist service late evening 03/06-03/07: hypoglycemic in AM and hyperglycemic in afternoon, transitioned to q4h CBG checks, dietary following. Restarting tube feeds qhs and basal insulin daily vs home bid dose. AKI treating w/ fluids.  03/07: hyperkalemic, can't safely give lasix d/t renal fxn, can't safely give albuterol d/t prolonged QT hx, can't safeyl give kayexalate w/ bowel dysfunction - ordered another insulin + dextrose and bolus NS, renal fxn improving somewhat. K improving into the afternoon.  03/08: overnight hyperglycemic  up to 752 and K increasing to 6.1, not responding to sq insulin, stopped tube feeds and sent to stepdown for insulin gtt. Lipase slight elevated, Osm high. VBG no acidosis, started on drip then significant  hypoglycemia to 37. Stopped gtt now checking Glc hourly.  03/09: Glc more stable overnight. D/c tube feeds and trial po intake daytime. Slow up-titration of insulin to avoid hypoglycemia. Consider higher dose semglee this evening and if stays on tube feeds overnight hopefully this will prevent hypoglycemia in early am / overnight.     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  Ideally would like her to get continuous blood glucose monitor, will d/w diabetes coordinator Consider restart tube feeds w/ glucerna - tube feeds qhs to get a substantial amount of her caloric intake and then po as able during the day w/ sliding scale    Hyperkalemia - resolved Caution w/ lasix d/t renal fxn,  caution w/ albuterol d/t prolonged QT hx, caution w/ kayexalate d/t bowel dysfunction  additional insulin + dextrose and IV fluids as needed  Closely monitor K  Telemetry   Severe gastroparesis limiting po intake  Jejunostomy feeding tube in place Appreciate dietary recs Plan for patient to have tube feeds qhs and po intake during the day as able  Anemia of  chronic disease Hemoglobin is 8.4, her baseline is 9.0-9.1 over the last 3 weeks No indication for blood transfusion at this time and patient denies any clinical signs of blood loss   Acute kidney injury superimposed on chronic kidney disease stage 3b (HCC)  fluids, NS 125 ml/hr Follow BMP    Adrenal insufficiency (HCC) home Solu-Cortef 10-20 mg per home dosing 20 mg in the morning, 10 mg at night   Chronic headache Prn meds as able - fioricet was not helpful, may consider nurtec  higher dose depakote though this doesn't sound like migraine headache. Will start depakote for headache prevention  Hypothyroidism TSH WNL 3.09 Levothyroxine 50 mcg daily   Insomnia Melatonin 5 mg nightly as needed   Essential hypertension Amlodipine 5 mg daily resumed   Anxiety Resumed home fluoxetine 10 mg daily   Prolonged QT avoid scheduled QT prolonging agents Holding home mirtazapine     DVT prophylaxis: heparin - pt has declined  Pertinent IV fluids/nutrition: see above Central lines / invasive devices: jejunostomy tube   Code Status: FULL CODE   Current Admission Status: observation   TOC needs / Dispo plan: anticipate d/t to prevous home environment, will need tube feed supplies Barriers to discharge / significant pending items: glc stability, dietary recs, titrating tube feeds, hypo/hyperglycemia, hypoerkalemia               Subjective / Brief ROS:  Patient reports feeling okay this morning, tired.  No nausea, would like to tty eating some today  Persistent headache past few weeks, takes occasional tylenol for this at home  Denies CP/SOB.  Pain controlled.  Denies new weakness.   Family Communication: none at this time    Objective Findings:  Vitals:   07/31/22 0700 07/31/22 0800 07/31/22 0900 07/31/22 1000  BP: (!) 148/81 (!) 145/77 (!) 154/78 (!) 148/82  Pulse: 78 74 79 87  Resp: (!) '9 13 15 16  '$ Temp:  98.4 F (36.9 C)    TempSrc:  Oral    SpO2: 99%  99% 99% 100%  Weight:        Intake/Output Summary (Last 24 hours) at 07/31/2022 1412 Last data filed at 07/31/2022 1358 Gross per 24 hour  Intake 2425.76 ml  Output 1475 ml  Net 950.76 ml   Filed Weights   07/29/22 0253 07/30/22 0500  Weight: 43.3 kg 43.9 kg    Examination:  Physical Exam Constitutional:      General: She is not in acute distress.    Appearance: Normal appearance. She is ill-appearing.  Cardiovascular:     Rate and Rhythm: Normal rate and regular rhythm.     Heart sounds: Normal heart sounds.  Pulmonary:     Effort: Pulmonary effort is normal.     Breath sounds: Normal breath sounds.  Abdominal:     General: Abdomen is flat. Bowel sounds are normal.     Palpations: Abdomen is soft.  Skin:    General: Skin is warm and dry.  Neurological:     General: No focal deficit present.     Mental Status: She is alert and oriented  to person, place, and time.  Psychiatric:        Mood and Affect: Mood normal.        Behavior: Behavior normal.          Scheduled Medications:   amLODipine  5 mg Oral Daily   Chlorhexidine Gluconate Cloth  6 each Topical Daily   divalproex  250 mg Oral Daily   feeding supplement (GLUCERNA SHAKE)  237 mL Oral TID BM   ferrous sulfate  325 mg Oral Q breakfast   FLUoxetine  10 mg Oral Daily   heparin  5,000 Units Subcutaneous Q8H   hydrocortisone  20 mg Oral Q breakfast   And   hydrocortisone  10 mg Oral Q supper   insulin aspart  0-6 Units Subcutaneous Q4H   insulin glargine-yfgn  2 Units Subcutaneous QHS   levothyroxine  50 mcg Oral Q0600   multivitamin with minerals  1 tablet Oral Daily    Continuous Infusions:  sodium chloride Stopped (07/31/22 1155)   feeding supplement (GLUCERNA 1.5 CAL) Stopped (07/31/22 1155)    PRN Medications:  dextrose, hydrALAZINE, melatonin, ondansetron **OR** ondansetron (ZOFRAN) IV, mouth rinse, senna-docusate  Antimicrobials from admission:  Anti-infectives (From admission, onward)     None           Data Reviewed:  I have personally reviewed the following...  CBC: Recent Labs  Lab 07/27/22 1733 07/28/22 0447 07/29/22 0532  WBC 4.8 5.1 8.0  NEUTROABS 3.0  --   --   HGB 8.4* 8.7* 8.6*  HCT 24.4* 24.6* 24.5*  MCV 87.8 85.1 84.8  PLT 178 193 [id number]   Basic Metabolic Panel: Recent Labs  Lab 07/27/22 1733 07/27/22 2214 07/28/22 0447 07/30/22 0210 07/30/22 0445 07/30/22 0937 07/30/22 1955 07/30/22 2308 07/31/22 0401 07/31/22 0404 07/31/22 0805 07/31/22 1145  NA 137  --    < > 132* 128*  --  132* 136  --  138  --   --   K 4.0  --    < > 6.0* 6.1*   < > 6.2* 4.6 5.0 5.0 4.9 4.7  CL 107  --    < > 103 99  --  105 109  --  109  --   --   CO2 24  --    < > 21* 18*  --  21* 22  --  22  --   --   GLUCOSE 90  --    < > 501* 752*  --  338* 207*  --  124*  --   --   BUN 51*  --    < > 60* 58*  --  52* 53*  --  47*  --   --   CREATININE 1.57*  --    < > 1.79* 1.83*  --  1.68* 1.65*  --  1.53*  --   --   CALCIUM 8.6*  --    < > 8.3* 8.0*  --  8.1* 8.3*  --  8.5*  --   --   MG 1.9  --   --   --   --   --   --   --   --   --   --   --   PHOS  --  3.8  --   --   --   --   --   --   --   --   --   --    < > = values  in this interval not displayed.   GFR: Estimated Creatinine Clearance: 36.9 mL/min (A) (by C-G formula based on SCr of 1.53 mg/dL (H)). Liver Function Tests: Recent Labs  Lab 07/27/22 1733  AST 29  ALT 18  ALKPHOS 84  BILITOT 0.5  PROT 6.8  ALBUMIN 3.6   Recent Labs  Lab 07/30/22 0740  LIPASE 65*   No results for input(s): "AMMONIA" in the last 168 hours. Coagulation Profile: No results for input(s): "INR", "PROTIME" in the last 168 hours. Cardiac Enzymes: No results for input(s): "CKTOTAL", "CKMB", "CKMBINDEX", "TROPONINI" in the last 168 hours. BNP (last 3 results) No results for input(s): "PROBNP" in the last 8760 hours. HbA1C: No results for input(s): "HGBA1C" in the last 72 hours. CBG: Recent Labs  Lab 07/31/22 0431  07/31/22 0633 07/31/22 0748 07/31/22 1122 07/31/22 1309  GLUCAP 162* 170* 180* 250* 215*   Lipid Profile: No results for input(s): "CHOL", "HDL", "LDLCALC", "TRIG", "CHOLHDL", "LDLDIRECT" in the last 72 hours. Thyroid Function Tests: No results for input(s): "TSH", "T4TOTAL", "FREET4", "T3FREE", "THYROIDAB" in the last 72 hours.  Anemia Panel: No results for input(s): "VITAMINB12", "FOLATE", "FERRITIN", "TIBC", "IRON", "RETICCTPCT" in the last 72 hours.  Most Recent Urinalysis On File:     Component Value Date/Time   COLORURINE YELLOW (A) 07/27/2022 1825   APPEARANCEUR CLEAR (A) 07/27/2022 1825   APPEARANCEUR Clear 09/18/2020 1153   LABSPEC 1.015 07/27/2022 1825   LABSPEC 1.026 03/01/2013 1844   PHURINE 5.0 07/27/2022 1825   GLUCOSEU 50 (A) 07/27/2022 1825   GLUCOSEU >=500 03/01/2013 1844   HGBUR SMALL (A) 07/27/2022 1825   BILIRUBINUR NEGATIVE 07/27/2022 1825   BILIRUBINUR neg 10/23/2020 0953   BILIRUBINUR Negative 09/18/2020 1153   BILIRUBINUR Negative 03/01/2013 1844   KETONESUR NEGATIVE 07/27/2022 1825   PROTEINUR 100 (A) 07/27/2022 1825   UROBILINOGEN 0.2 10/23/2020 0953   NITRITE NEGATIVE 07/27/2022 1825   LEUKOCYTESUR NEGATIVE 07/27/2022 1825   LEUKOCYTESUR Negative 03/01/2013 1844   Sepsis Labs: '@LABRCNTIP'$ (procalcitonin:4,lacticidven:4) Microbiology: Recent Results (from the past 240 hour(s))  Resp panel by RT-PCR (RSV, Flu A&B, Covid) Anterior Nasal Swab     Status: None   Collection Time: 07/27/22  5:34 PM   Specimen: Anterior Nasal Swab  Result Value Ref Range Status   SARS Coronavirus 2 by RT PCR NEGATIVE NEGATIVE Final    Comment: (NOTE) SARS-CoV-2 target nucleic acids are NOT DETECTED.  The SARS-CoV-2 RNA is generally detectable in upper respiratory specimens during the acute phase of infection. The lowest concentration of SARS-CoV-2 viral copies this assay can detect is 138 copies/mL. A negative result does not preclude SARS-Cov-2 infection and  should not be used as the sole basis for treatment or other patient management decisions. A negative result may occur with  improper specimen collection/handling, submission of specimen other than nasopharyngeal swab, presence of viral mutation(s) within the areas targeted by this assay, and inadequate number of viral copies(<138 copies/mL). A negative result must be combined with clinical observations, patient history, and epidemiological information. The expected result is Negative.  Fact Sheet for Patients:  EntrepreneurPulse.com.au  Fact Sheet for Healthcare Providers:  IncredibleEmployment.be  This test is no t yet approved or cleared by the [place] FDA and  has been authorized for detection and/or diagnosis of SARS-CoV-2 by FDA under an Emergency Use Authorization (EUA). This EUA will remain  in effect (meaning this test can be used) for the duration of the COVID-19 declaration under Section 564(b)(1) of the Act, 21 U.S.C.section 360bbb-3(b)(1), unless the  authorization is terminated  or revoked sooner.       Influenza A by PCR NEGATIVE NEGATIVE Final   Influenza B by PCR NEGATIVE NEGATIVE Final    Comment: (NOTE) The Xpert Xpress SARS-CoV-2/FLU/RSV plus assay is intended as an aid in the diagnosis of influenza from Nasopharyngeal swab specimens and should not be used as a sole basis for treatment. Nasal washings and aspirates are unacceptable for Xpert Xpress SARS-CoV-2/FLU/RSV testing.  Fact Sheet for Patients: EntrepreneurPulse.com.au  Fact Sheet for Healthcare Providers: IncredibleEmployment.be  This test is not yet approved or cleared by the [place] FDA and has been authorized for detection and/or diagnosis of SARS-CoV-2 by FDA under an Emergency Use Authorization (EUA). This EUA will remain in effect (meaning this test can be used) for the duration of the COVID-19 declaration  under Section 564(b)(1) of the Act, 21 U.S.C. section 360bbb-3(b)(1), unless the authorization is terminated or revoked.     Resp Syncytial Virus by PCR NEGATIVE NEGATIVE Final    Comment: (NOTE) Fact Sheet for Patients: EntrepreneurPulse.com.au  Fact Sheet for Healthcare Providers: IncredibleEmployment.be  This test is not yet approved or cleared by the [place] FDA and has been authorized for detection and/or diagnosis of SARS-CoV-2 by FDA under an Emergency Use Authorization (EUA). This EUA will remain in effect (meaning this test can be used) for the duration of the COVID-19 declaration under Section 564(b)(1) of the Act, 21 U.S.C. section 360bbb-3(b)(1), unless the authorization is terminated or revoked.  Performed at [facility], [address] [place], [place] [address]   MRSA Next Gen by PCR, Nasal     Status: None   Collection Time: 07/30/22  8:59 AM   Specimen: Nasal Mucosa; Nasal Swab  Result Value Ref Range Status   MRSA by PCR Next Gen NOT DETECTED NOT DETECTED Final    Comment: (NOTE) The GeneXpert MRSA Assay (FDA approved for NASAL specimens only), is one component of a comprehensive MRSA colonization surveillance program. It is not intended to diagnose MRSA infection nor to guide or monitor treatment for MRSA infections. Test performance is not FDA approved in patients less than 26 years old. Performed at [facility], [address] [place], [place] [address]       Radiology Studies last 3 days: CT ANGIO HEAD NECK W WO CM  Result Date: 07/27/2022 CLINICAL DATA:  Collapsed earlier today. EXAM: CT ANGIOGRAPHY HEAD AND NECK TECHNIQUE: Multidetector CT imaging of the head and neck was performed using the standard protocol during bolus administration of intravenous contrast. Multiplanar CT image reconstructions and MIPs were obtained to evaluate the vascular anatomy. Carotid stenosis measurements  (when applicable) are obtained utilizing NASCET criteria, using the distal internal carotid diameter as the denominator. RADIATION DOSE REDUCTION: This exam was performed according to the departmental dose-optimization program which includes automated exposure control, adjustment of the mA and/or kV according to patient size and/or use of iterative reconstruction technique. CONTRAST:  29m OMNIPAQUE IOHEXOL 350 MG/ML SOLN COMPARISON:  Same-day noncontrast CT head FINDINGS: CTA NECK FINDINGS Aortic arch: The imaged aortic arch is normal. The origins of the major branch vessels are patent. The subclavian arteries are patent to the level imaged. Right carotid system: The right common, internal, and external carotid arteries are patent, without stenosis or occlusion. There is no evidence of dissection or aneurysm. Left carotid system: The left common, internal, and external carotid arteries are patent, without stenosis or occlusion. There is no evidence of dissection or aneurysm. Vertebral arteries:  The vertebral arteries are patent, without hemodynamically significant stenosis or occlusion. There is no evidence of dissection or aneurysm. Skeleton: There is no acute osseous abnormality or suspicious osseous lesion. There is no visible canal hematoma. Other neck: Soft tissues of the neck are unremarkable. Upper chest: The imaged lung apices are clear. Review of the MIP images confirms the above findings CTA HEAD FINDINGS Anterior circulation: The intracranial ICAs are normal. The bilateral MCAs are normal. The bilateral ACAs are normal. The anterior communicating artery is normal There is no aneurysm or AVM. Posterior circulation: The bilateral V4 segments are normal. There is fenestration in the proximal basilar artery, a normal variant. The basilar artery is otherwise normal. The major cerebellar arteries appear normal. The bilateral PCAs are normal. Bilateral posterior communicating arteries are identified. There is  no aneurysm or AVM. Venous sinuses: Patent. Anatomic variants: As above. Review of the MIP images confirms the above findings IMPRESSION: Normal vasculature of the head and neck. Electronically Signed   By: Valetta Mole M.D.   On: 07/27/2022 19:24   DG Chest Portable 1 View  Result Date: 07/27/2022 CLINICAL DATA:  Syncope. EXAM: PORTABLE CHEST 1 VIEW COMPARISON:  AP chest 06/09/2022 FINDINGS: Cardiac silhouette and mediastinal contours are within normal limits. The lungs are clear. No pleural effusion or pneumothorax. No acute skeletal abnormality. IMPRESSION: No active disease. Electronically Signed   By: Yvonne Kendall M.D.   On: 07/27/2022 17:01   CT HEAD WO CONTRAST (5MM)  Result Date: 07/27/2022 CLINICAL DATA:  Head trauma, abnormal mental status (Age 34-64y); Ataxia, cervical trauma EXAM: CT HEAD WITHOUT CONTRAST CT CERVICAL SPINE WITHOUT CONTRAST TECHNIQUE: Multidetector CT imaging of the head and cervical spine was performed following the standard protocol without intravenous contrast. Multiplanar CT image reconstructions of the cervical spine were also generated. RADIATION DOSE REDUCTION: This exam was performed according to the departmental dose-optimization program which includes automated exposure control, adjustment of the mA and/or kV according to patient size and/or use of iterative reconstruction technique. COMPARISON:  None Available. FINDINGS: CT HEAD FINDINGS Brain: No evidence of acute infarction, hemorrhage, hydrocephalus, extra-axial collection or mass lesion/mass effect. Vascular: No hyperdense vessel identified. Skull: No acute fracture. Sinuses/Orbits: Clear sinuses.  No acute orbital findings. Other: No mastoid effusions. CT CERVICAL SPINE FINDINGS Alignment: Straightening.  No substantial sagittal subluxation. Skull base and vertebrae: Vertebral body heights are maintained. No evidence of acute fracture. Soft tissues and spinal canal: No prevertebral fluid or swelling. No visible  canal hematoma. Disc levels:  No significant focal bony degenerative change. Upper chest: Visualized lung apices are clear. IMPRESSION: No acute intracranial or cervical spine findings. Electronically Signed   By: Margaretha Sheffield M.D.   On: 07/27/2022 16:56   CT Cervical Spine Wo Contrast  Result Date: 07/27/2022 CLINICAL DATA:  Head trauma, abnormal mental status (Age 12-64y); Ataxia, cervical trauma EXAM: CT HEAD WITHOUT CONTRAST CT CERVICAL SPINE WITHOUT CONTRAST TECHNIQUE: Multidetector CT imaging of the head and cervical spine was performed following the standard protocol without intravenous contrast. Multiplanar CT image reconstructions of the cervical spine were also generated. RADIATION DOSE REDUCTION: This exam was performed according to the departmental dose-optimization program which includes automated exposure control, adjustment of the mA and/or kV according to patient size and/or use of iterative reconstruction technique. COMPARISON:  None Available. FINDINGS: CT HEAD FINDINGS Brain: No evidence of acute infarction, hemorrhage, hydrocephalus, extra-axial collection or mass lesion/mass effect. Vascular: No hyperdense vessel identified. Skull: No acute fracture. Sinuses/Orbits: Clear sinuses.  No  acute orbital findings. Other: No mastoid effusions. CT CERVICAL SPINE FINDINGS Alignment: Straightening.  No substantial sagittal subluxation. Skull base and vertebrae: Vertebral body heights are maintained. No evidence of acute fracture. Soft tissues and spinal canal: No prevertebral fluid or swelling. No visible canal hematoma. Disc levels:  No significant focal bony degenerative change. Upper chest: Visualized lung apices are clear. IMPRESSION: No acute intracranial or cervical spine findings. Electronically Signed   By: Margaretha Sheffield M.D.   On: 07/27/2022 16:56             LOS: 3 days    Time spent 50 min    Emeterio Reeve, DO [facility] 07/31/2022, 2:12 PM     Dictation software may have been used to generate the above note. Typos may occur and escape review in typed/dictated notes. Please contact Dr Sheppard Coil directly for clarity if needed.  Staff may message me via secure chat in [facility]  but this may not receive an immediate response,  please page me for urgent matters!  If 7PM-7AM, please contact night coverage [email or web address]

## 2022-07-31 NOTE — Progress Notes (Addendum)
       CROSS COVER NOTE  NAME: LANDON TRUAX MRN: [id number] DOB : [birth year]    HPI/Events of Note   Report: Headache 10/10 unrelieved with tylenol K 6.2 On review of chart: Off insulin drip. No maintenance IV fluids.    Assessment and  Interventions   Assessment: Na 132, CO2 21, Creat 1.68 Osmolite TF changed to glucerna yesterday and almost at goal per RN Plan  Ns at 100 initiated early in night Serial potassium levels Repeat labs Fiorcet x1 for headache ineffective, one time dose tramadol ordered      Kathlene Cote NP [facility]

## 2022-08-01 DIAGNOSIS — E162 Hypoglycemia, unspecified: Secondary | ICD-10-CM | POA: Diagnosis not present

## 2022-08-01 LAB — POTASSIUM
Potassium: 4.2 mmol/L (ref 3.5–5.1)
Potassium: 4.2 mmol/L (ref 3.5–5.1)
Potassium: 4.5 mmol/L (ref 3.5–5.1)
Potassium: 4.7 mmol/L (ref 3.5–5.1)
Potassium: 5.5 mmol/L — ABNORMAL HIGH (ref 3.5–5.1)
Potassium: 6.1 mmol/L — ABNORMAL HIGH (ref 3.5–5.1)

## 2022-08-01 LAB — GLUCOSE, CAPILLARY
Glucose-Capillary: 128 mg/dL — ABNORMAL HIGH (ref 70–99)
Glucose-Capillary: 129 mg/dL — ABNORMAL HIGH (ref 70–99)
Glucose-Capillary: 209 mg/dL — ABNORMAL HIGH (ref 70–99)
Glucose-Capillary: 315 mg/dL — ABNORMAL HIGH (ref 70–99)
Glucose-Capillary: 365 mg/dL — ABNORMAL HIGH (ref 70–99)
Glucose-Capillary: 569 mg/dL (ref 70–99)
Glucose-Capillary: 575 mg/dL (ref 70–99)
Glucose-Capillary: 76 mg/dL (ref 70–99)
Glucose-Capillary: 79 mg/dL (ref 70–99)
Glucose-Capillary: 92 mg/dL (ref 70–99)

## 2022-08-01 LAB — GLUCOSE, RANDOM
Glucose, Bld: 285 mg/dL — ABNORMAL HIGH (ref 70–99)
Glucose, Bld: 316 mg/dL — ABNORMAL HIGH (ref 70–99)
Glucose, Bld: 126 mg/dL — ABNORMAL HIGH (ref 70–99)
Glucose, Bld: 401 mg/dL — ABNORMAL HIGH (ref 70–99)

## 2022-08-01 LAB — BLOOD GAS, VENOUS
Acid-base deficit: 4.8 mmol/L — ABNORMAL HIGH (ref 0.0–2.0)
Bicarbonate: 21.6 mmol/L (ref 20.0–28.0)
O2 Saturation: 80.2 %
Patient temperature: 37
pCO2, Ven: 44 mmHg (ref 44–60)
pH, Ven: 7.3 (ref 7.25–7.43)
pO2, Ven: 47 mmHg — ABNORMAL HIGH (ref 32–45)

## 2022-08-01 LAB — BASIC METABOLIC PANEL
Anion gap: 5 (ref 5–15)
BUN: 48 mg/dL — ABNORMAL HIGH (ref 6–20)
CO2: 23 mmol/L (ref 22–32)
Calcium: 8.1 mg/dL — ABNORMAL LOW (ref 8.9–10.3)
Chloride: 110 mmol/L (ref 98–111)
Creatinine, Ser: 1.46 mg/dL — ABNORMAL HIGH (ref 0.44–1.00)
GFR, Estimated: 49 mL/min — ABNORMAL LOW (ref >60)
Glucose, Bld: 74 mg/dL (ref 70–99)
Potassium: 4.7 mmol/L (ref 3.5–5.1)
Sodium: 138 mmol/L (ref 135–145)

## 2022-08-01 LAB — VITAMIN B1: Vitamin B1 (Thiamine): 111.7 nmol/L (ref 66.5–200.0)

## 2022-08-01 MED ORDER — BUTALBITAL-APAP-CAFFEINE 50-325-40 MG PO TABS
2.0000 | ORAL_TABLET | Freq: Once | ORAL | Status: AC
  Administered 2022-08-01: 2 via ORAL
  Filled 2022-08-01: qty 2

## 2022-08-01 MED ORDER — DEXTROSE 10 % IV SOLN: Freq: Once | INTRAVENOUS | Status: DC

## 2022-08-01 MED ORDER — INSULIN ASPART 100 UNIT/ML IJ SOLN: 4.0000 [IU] | Freq: Three times a day (TID) | INTRAMUSCULAR | Status: DC

## 2022-08-01 MED ORDER — INSULIN ASPART 100 UNIT/ML IJ SOLN
0.0000 [IU] | Freq: Every day | INTRAMUSCULAR | Status: DC
  Administered 2022-08-01: 5 [IU] via SUBCUTANEOUS
  Administered 2022-08-04: 4 [IU] via SUBCUTANEOUS
  Filled 2022-08-01 (×2): qty 1

## 2022-08-01 MED ORDER — INSULIN ASPART 100 UNIT/ML IJ SOLN
4.0000 [IU] | Freq: Three times a day (TID) | INTRAMUSCULAR | Status: DC
  Administered 2022-08-02 – 2022-08-05 (×9): 4 [IU] via SUBCUTANEOUS
  Filled 2022-08-01 (×8): qty 1

## 2022-08-01 MED ORDER — INSULIN GLARGINE-YFGN 100 UNIT/ML ~~LOC~~ SOLN
4.0000 [IU] | Freq: Every day | SUBCUTANEOUS | Status: DC
  Administered 2022-08-01: 4 [IU] via SUBCUTANEOUS
  Filled 2022-08-01: qty 0.04

## 2022-08-01 MED ORDER — INSULIN ASPART 100 UNIT/ML IJ SOLN
0.0000 [IU] | Freq: Three times a day (TID) | INTRAMUSCULAR | Status: DC
  Administered 2022-08-02: 4 [IU] via SUBCUTANEOUS
  Administered 2022-08-02 – 2022-08-03 (×2): 1 [IU] via SUBCUTANEOUS
  Administered 2022-08-03: 4 [IU] via SUBCUTANEOUS
  Administered 2022-08-04 – 2022-08-05 (×2): 2 [IU] via SUBCUTANEOUS
  Filled 2022-08-01 (×7): qty 1

## 2022-08-01 MED ORDER — SODIUM CHLORIDE 0.9 % IV BOLUS
500.0000 mL | Freq: Once | INTRAVENOUS | Status: AC
  Administered 2022-08-01: 500 mL via INTRAVENOUS

## 2022-08-01 NOTE — Progress Notes (Signed)
Hassan Rowan, NP notified of CBG 463. New order entered to give 8 units of Novolog once now and continue to monitor.

## 2022-08-01 NOTE — Progress Notes (Signed)
PROGRESS NOTE    Ashley Camacho   [id number] DOB: 1990/08/13  DOA: 07/27/2022 Date of Service: 08/01/22 PCP: Ashley Camacho, No     Brief Narrative / Hospital Course:  [name] is a 32 year old female with history of hypertension, insulin-dependent diabetes mellitus, hypothyroid, history of hypertension, CKD stage IIIb, adrenal insufficiency, hypothyroid, major depressive disorder, presence of jejunostomy tube, protein calorie malnutrition, who presents emergency department from home via EMS on 07/27/2022 for chief concerns of hypoglycemia. Jejunostomy tube placed 03/04, reportedly had tube placed due to failure to eat. There was a concern that patient went to take a nap at 10 AM and then she went up to wake up and get a snack and she passed out. Family were concerned that she did hit her head but no obvious trauma noted Patient was significantly altered per EMS sugars were in the 30s and patient was given some D10 and sugars increased but patient is was altered on arrival to ED. She reports she did not take her insulin on date of arrival to ED. She last used insulin 07/26/22 evening. She states her blood glucose was 264 that night.  03/05: admitted to hospitalist service late evening 03/06-03/07: hypoglycemic in AM and hyperglycemic in afternoon, transitioned to q4h CBG checks, dietary following. Restarting tube feeds qhs and basal insulin daily vs home bid dose. AKI treating w/ fluids.  03/07: hyperkalemic, can't safely give lasix d/t renal fxn, can't safely give albuterol d/t prolonged QT hx, can't safeyl give kayexalate w/ bowel dysfunction - ordered another insulin + dextrose and bolus NS, renal fxn improving somewhat. K improving into the afternoon.  03/08: overnight hyperglycemic  up to 752 and K increasing to 6.1, not responding to sq insulin, stopped tube feeds and sent to stepdown for insulin gtt. Lipase slight elevated, Osm high. VBG no acidosis, started on drip then significant  hypoglycemia to 37. Stopped gtt now checking Glc hourly.  03/09: Glc more stable overnight. D/c tube feeds and trial po intake daytime. Slow up-titration of insulin to avoid hypoglycemia. Consider higher dose semglee this evening and if stays on tube feeds overnight hopefully this will prevent hypoglycemia in early am / overnight. Glc 463 at 19:30 03/10:  dipping a bit overnight (lowest 74 approx 04:00) then up again toward morning (209 at 06:30). Goal today: insulin at mealtimes only but will still monitor serum Glc along w/ K q4h blood draw, CBG and insulin ac.     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  D50 IV as needed for hypoglycemia Checking Glc q2-4h - remain in stepdown for close nursing care  Off gtt  Ideally would like her to get continuous blood glucose monitor, will d/w diabetes coordinator restart tube feeds w/  glucerna at night - tube feeds qhs to get a substantial amount of her caloric intake and then po as able during the day w/ sliding scale. Tube feeds have been at 50 mL/h but will perhaps try increasing that tonight   Hyperkalemia  Caution w/ lasix d/t renal fxn, caution w/ albuterol d/t prolonged QT hx, caution w/ kayexalate d/t bowel dysfunction  additional insulin  + dextrose and IV fluids as needed  Closely monitor K  Telemetry   Severe gastroparesis limiting po intake  Jejunostomy feeding tube in place Appreciate dietary recs Plan for patient to have tube feeds qhs and po intake during the day as able  Anemia of chronic disease Hemoglobin is 8.4, her baseline is 9.0-9.1 over the last 3 weeks No indication for blood transfusion at this time and patient denies any clinical signs of blood loss   Acute kidney injury superimposed on chronic kidney disease stage 3b (HCC)  fluids, NS 125 ml/hr Follow BMP    Adrenal insufficiency (HCC) home Solu-Cortef 10-20 mg per home dosing 20 mg in the morning, 10 mg at night   Chronic headache Prn meds as able - fioricet was not helpful, may consider nurtec or higher dose depakote though this doesn't sound like migraine headache. Will start depakote for headache prevention  Hypothyroidism TSH WNL 3.09 Levothyroxine 50 mcg daily   Insomnia Melatonin 5 mg nightly as needed   Essential hypertension Amlodipine 5 mg daily    Anxiety home fluoxetine 10 mg daily   Prolonged QT avoid scheduled QT prolonging agents Holding home mirtazapine     DVT prophylaxis: heparin - pt has declined  Pertinent IV fluids/nutrition: see above Central lines / invasive devices: jejunostomy tube   Code Status: FULL CODE   Current Admission Status: inpatient, stepdown - if ICU needs beds would probably be ok to transfer units today but RN and I have been working together a few days on this patinet and she is requiring frequent labs / med adjustments so will leave in SDU for now.    TOC needs / Dispo plan: anticipate d/t to prevous home environment, will need tube feed supplies, GLc monitoring supplies  Barriers to discharge / significant pending items: glc stability, dietary recs, titrating tube feeds, hypo/hyperglycemia, hypoerkalemia               Subjective / Brief ROS:  Patient reports feeling okay this  morning, tired. Still has headache mild.  No nausea, tolerating po  Denies CP/SOB.  Pain controlled.  Denies new weakness.   Family Communication: none at this time    Objective Findings:  Vitals:   08/01/22 0500 08/01/22 0600 08/01/22 0700 08/01/22 0929  BP: 124/67 (!) 144/78 (!) 150/81 (!) 158/86  Pulse: 81 86 87   Resp: '15 15 18   '$ Temp:      TempSrc:      SpO2: 100% 98% 100%   Weight:        Intake/Output Summary (Last 24 hours) at 08/01/2022 1007 Last data filed at 08/01/2022 0700 Gross per 24 hour  Intake 2893.25 ml  Output 1650 ml  Net 1243.25 ml   Filed Weights   07/29/22 0253 07/30/22 0500  Weight: 43.3 kg 43.9 kg    Examination:  Physical Exam Constitutional:      General: She is not in acute distress.    Appearance: Normal appearance.  Cardiovascular:     Rate and Rhythm: Normal rate and regular rhythm.     Heart sounds: Normal  heart sounds.  Pulmonary:     Effort: Pulmonary effort is normal.     Breath sounds: Normal breath sounds.  Abdominal:     General: Abdomen is flat. Bowel sounds are normal.     Palpations: Abdomen is soft.  Skin:    General: Skin is warm and dry.  Neurological:     General: No focal deficit present.     Mental Status: She is alert and oriented to person, place, and time.  Psychiatric:        Mood and Affect: Mood normal.        Behavior: Behavior normal.          Scheduled Medications:   amLODipine  5 mg Oral Daily   Chlorhexidine Gluconate Cloth  6 each Topical Daily   divalproex  250 mg Oral Daily   feeding supplement (GLUCERNA SHAKE)  237 mL Oral TID BM   ferrous sulfate  325 mg Oral Q breakfast   FLUoxetine  10 mg Oral Daily   heparin  5,000 Units Subcutaneous Q8H   hydrocortisone  20 mg Oral Q breakfast   And   hydrocortisone  10 mg Oral Q supper   insulin aspart  0-6 Units Subcutaneous Q4H   insulin aspart  3 Units Subcutaneous TID WC   insulin glargine-yfgn  2 Units Subcutaneous QHS   levothyroxine   50 mcg Oral Q0600   multivitamin with minerals  1 tablet Oral Daily    Continuous Infusions:  sodium chloride 100 mL/hr at 08/01/22 0700   feeding supplement (GLUCERNA 1.5 CAL) 50 mL/hr at 08/01/22 0700    PRN Medications:  hydrALAZINE, melatonin, ondansetron **OR** ondansetron (ZOFRAN) IV, mouth rinse, senna-docusate  Antimicrobials from admission:  Anti-infectives (From admission, onward)    None           Data Reviewed:  I have personally reviewed the following...  CBC: Recent Labs  Lab 07/27/22 1733 07/28/22 0447 07/29/22 0532  WBC 4.8 5.1 8.0  NEUTROABS 3.0  --   --   HGB 8.4* 8.7* 8.6*  HCT 24.4* 24.6* 24.5*  MCV 87.8 85.1 84.8  PLT 178 193 [id number]   Basic Metabolic Panel: Recent Labs  Lab 07/27/22 1733 07/27/22 2214 07/28/22 0447 07/30/22 0445 07/30/22 0937 07/30/22 1955 07/30/22 2308 07/31/22 0401 07/31/22 0404 07/31/22 0805 07/31/22 1955 08/01/22 0012 08/01/22 0349 08/01/22 0350 08/01/22 0813  NA 137  --    < > 128*  --  132* 136  --  138  --   --   --  138  --   --   K 4.0  --    < > 6.1*   < > 6.2* 4.6   < > 5.0   < > 6.1* 4.2 4.7 4.7 5.5*  CL 107  --    < > 99  --  105 109  --  109  --   --   --  110  --   --   CO2 24  --    < > 18*  --  21* 22  --  22  --   --   --  23  --   --   GLUCOSE 90  --    < > 752*  --  338* 207*  --  124*  --   --   --  74  --  285*  BUN 51*  --    < > 58*  --  52* 53*  --  47*  --   --   --  48*  --   --   CREATININE 1.57*  --    < > 1.83*  --  1.68* 1.65*  --  1.53*  --   --   --  1.46*  --   --   CALCIUM 8.6*  --    < > 8.0*  --  8.1* 8.3*  --  8.5*  --   --   --  8.1*  --   --   MG 1.9  --   --   --   --   --   --   --   --   --   --   --   --   --   --   PHOS  --  3.8  --   --   --   --   --   --   --   --   --   --   --   --   --    < > = values in this interval not displayed.   GFR: Estimated Creatinine Clearance: 38.7 mL/min (A) (by C-G formula based on SCr of 1.46 mg/dL (H)). Liver Function  Tests: Recent Labs  Lab 07/27/22 1733  AST 29  ALT 18  ALKPHOS 84  BILITOT 0.5  PROT 6.8  ALBUMIN 3.6   Recent Labs  Lab 07/30/22 0740  LIPASE 65*   No results for input(s): "AMMONIA" in the last 168 hours. Coagulation Profile: No results for input(s): "INR", "PROTIME" in the last 168 hours. Cardiac Enzymes: No results for input(s): "CKTOTAL", "CKMB", "CKMBINDEX", "TROPONINI" in the last 168 hours. BNP (last 3 results) No results for input(s): "PROBNP" in the last 8760 hours. HbA1C: No results for input(s): "HGBA1C" in the last 72 hours. CBG: Recent Labs  Lab 08/01/22 0153 08/01/22 0357 08/01/22 0450 08/01/22 0634 08/01/22 0834  GLUCAP 79 76 128* 209* 315*   Lipid Profile: No results for input(s): "CHOL", "HDL", "LDLCALC", "TRIG", "CHOLHDL", "LDLDIRECT" in the last 72 hours. Thyroid Function Tests: No results for input(s): "TSH", "T4TOTAL", "FREET4", "T3FREE", "THYROIDAB" in the last 72 hours.  Anemia Panel: No results for input(s): "VITAMINB12", "FOLATE", "FERRITIN", "TIBC", "IRON", "RETICCTPCT" in the last 72 hours.  Most Recent Urinalysis On File:     Component Value Date/Time   COLORURINE YELLOW (A) 07/27/2022 1825   APPEARANCEUR CLEAR (A) 07/27/2022 1825   APPEARANCEUR Clear 09/18/2020 1153   LABSPEC 1.015 07/27/2022 1825   LABSPEC 1.026 03/01/2013 1844   PHURINE 5.0 07/27/2022 1825   GLUCOSEU 50 (A) 07/27/2022 1825   GLUCOSEU >=500 03/01/2013 1844   HGBUR SMALL (A) 07/27/2022 1825   BILIRUBINUR NEGATIVE 07/27/2022 1825   BILIRUBINUR neg 10/23/2020 0953   BILIRUBINUR Negative 09/18/2020 1153   BILIRUBINUR Negative 03/01/2013 1844   KETONESUR NEGATIVE 07/27/2022 1825   PROTEINUR 100 (A) 07/27/2022 1825   UROBILINOGEN 0.2 10/23/2020 0953   NITRITE NEGATIVE 07/27/2022 1825   LEUKOCYTESUR NEGATIVE 07/27/2022 1825   LEUKOCYTESUR Negative 03/01/2013 1844   Sepsis Labs: '@LABRCNTIP'$ (procalcitonin:4,lacticidven:4) Microbiology: Recent Results (from the  past 240 hour(s))  Resp panel by RT-PCR (RSV, Flu A&B, Covid) Anterior Nasal Swab     Status: None   Collection Time: 07/27/22  5:34 PM   Specimen: Anterior Nasal Swab  Result Value Ref Range Status   SARS Coronavirus 2 by RT PCR NEGATIVE NEGATIVE Final    Comment: (NOTE) SARS-CoV-2 target nucleic acids are NOT DETECTED.  The SARS-CoV-2 RNA is generally detectable in upper respiratory specimens during the acute  phase of infection. The lowest concentration of SARS-CoV-2 viral copies this assay can detect is 138 copies/mL. A negative result does not preclude SARS-Cov-2 infection and should not be used as the sole basis for treatment or other patient management decisions. A negative result may occur with  improper specimen collection/handling, submission of specimen other than nasopharyngeal swab, presence of viral mutation(s) within the areas targeted by this assay, and inadequate number of viral copies(<138 copies/mL). A negative result must be combined with clinical observations, patient history, and epidemiological information. The expected result is Negative.  Fact Sheet for Patients:  EntrepreneurPulse.com.au  Fact Sheet for Healthcare Providers:  IncredibleEmployment.be  This test is no t yet approved or cleared by the [place] FDA and  has been authorized for detection and/or diagnosis of SARS-CoV-2 by FDA under an Emergency Use Authorization (EUA). This EUA will remain  in effect (meaning this test can be used) for the duration of the COVID-19 declaration under Section 564(b)(1) of the Act, 21 U.S.C.section 360bbb-3(b)(1), unless the authorization is terminated  or revoked sooner.       Influenza A by PCR NEGATIVE NEGATIVE Final   Influenza B by PCR NEGATIVE NEGATIVE Final    Comment: (NOTE) The Xpert Xpress SARS-CoV-2/FLU/RSV plus assay is intended as an aid in the diagnosis of influenza from Nasopharyngeal swab specimens  and should not be used as a sole basis for treatment. Nasal washings and aspirates are unacceptable for Xpert Xpress SARS-CoV-2/FLU/RSV testing.  Fact Sheet for Patients: EntrepreneurPulse.com.au  Fact Sheet for Healthcare Providers: IncredibleEmployment.be  This test is not yet approved or cleared by the [place] FDA and has been authorized for detection and/or diagnosis of SARS-CoV-2 by FDA under an Emergency Use Authorization (EUA). This EUA will remain in effect (meaning this test can be used) for the duration of the COVID-19 declaration under Section 564(b)(1) of the Act, 21 U.S.C. section 360bbb-3(b)(1), unless the authorization is terminated or revoked.     Resp Syncytial Virus by PCR NEGATIVE NEGATIVE Final    Comment: (NOTE) Fact Sheet for Patients: EntrepreneurPulse.com.au  Fact Sheet for Healthcare Providers: IncredibleEmployment.be  This test is not yet approved or cleared by the [place] FDA and has been authorized for detection and/or diagnosis of SARS-CoV-2 by FDA under an Emergency Use Authorization (EUA). This EUA will remain in effect (meaning this test can be used) for the duration of the COVID-19 declaration under Section 564(b)(1) of the Act, 21 U.S.C. section 360bbb-3(b)(1), unless the authorization is terminated or revoked.  Performed at [facility], [address] [place], [place] [address]   MRSA Next Gen by PCR, Nasal     Status: None   Collection Time: 07/30/22  8:59 AM   Specimen: Nasal Mucosa; Nasal Swab  Result Value Ref Range Status   MRSA by PCR Next Gen NOT DETECTED NOT DETECTED Final    Comment: (NOTE) The GeneXpert MRSA Assay (FDA approved for NASAL specimens only), is one component of a comprehensive MRSA colonization surveillance program. It is not intended to diagnose MRSA infection nor to guide or monitor treatment for MRSA  infections. Test performance is not FDA approved in patients less than 75 years old. Performed at [facility], [address] [place], [place] [address]       Radiology Studies last 3 days: No results found.           LOS: 4 days    Time spent 50 min    Emeterio Reeve, DO [facility]  08/01/2022, 10:07 AM    Dictation software may have been used to generate the above note. Typos may occur and escape review in typed/dictated notes. Please contact Dr Sheppard Coil directly for clarity if needed.  Staff may message me via secure chat in [place]  but this may not receive an immediate response,  please page me for urgent matters!  If 7PM-7AM, please contact night coverage [email or web address]

## 2022-08-01 NOTE — Progress Notes (Signed)
Hassan Rowan, NP notified of CBG down to 79. Patient has ate some graham crackers with PB; Recheck CBG in 1 hour.

## 2022-08-01 NOTE — Progress Notes (Signed)
       CROSS COVER NOTE  NAME: Ashley Camacho MRN: [id number] DOB : 17-May-1991 ATTENDING PHYSICIAN: Ashley Reeve, DO    Date of Service   08/01/2022   HPI/Events of Note   Report *** CBG 569; K 6.1 On Review of chart *** VERY brittle Bedside eval*** HPI***  Interventions   Assessment/Plan: Continue with 4U Semglee 5U IV Novolog, Recheck CBG in 1 hr 500 mL NS bolus Continue Q4H K    *** professional thanks      To reach the provider On-Call:   7AM- 7PM see care teams to locate the attending and reach out to them via [email or web address] Password: [email or web address] 7PM-7AM contact night-coverage If you still have difficulty reaching the appropriate provider, please page the [facility] (Director on Call) for [facility] on amion for assistance  This document was prepared using Systems analyst and may include unintentional dictation errors.  Ashley Glass DNP, MBA, FNP-BC, PMHNP-BC Nurse Practitioner [facility] Pager [phone]

## 2022-08-01 NOTE — Progress Notes (Signed)
Hassan Rowan, NP notified of potassium level 6.1; new order entered for 5 units Novolog insulin IV, 500 ml NS IVF bolus, restart NS at 100 ml/hr, and restart Glucerna 1.5 tube feeds per order. Will continue to monitor.

## 2022-08-01 NOTE — Progress Notes (Signed)
Hassan Rowan, NP notified of CBG 92; acknowledged. Recheck in 1 hour to monitor for hypoglycemia.

## 2022-08-01 NOTE — Progress Notes (Addendum)
Hassan Rowan, NP notified of CBG 76, patient given 4 ounces of OJ and recheck CBG in 1 hour. Patient also complaining of headache pain 7/10. See new order entered for Fioricet 2 tab PO once. Continue to monitor patient.

## 2022-08-01 NOTE — Progress Notes (Deleted)
Patient alert and oriented in bed on room air. Blood pressure soft but stable. MD aware. Medication held. HR in/out Afibb HR ranging from 90s-138s. MD made aware. MD notified of K levels. Medication given per orders. MD made aware of BGS levels and endotools titrations. No new orders. Continued to monitor and follow Endotool.

## 2022-08-01 NOTE — Inpatient Diabetes Management (Signed)
Inpatient Diabetes Program Recommendations  AACE/ADA: New Consensus Statement on Inpatient Glycemic Control (2015)  Target Ranges:  Prepandial:   less than 140 mg/dL      Peak postprandial:   less than 180 mg/dL (1-2 hours)      Critically ill patients:  140 - 180 mg/dL   Lab Results  Component Value Date   GLUCAP 315 (H) 08/01/2022   HGBA1C 10.2 (H) 02/28/2022    Review of Glycemic Control  Diabetes history: DM1 Outpatient Diabetes medications: Basaglar 4 units BID, Novolog 2 units TID with meals Current orders for Inpatient glycemic control: Semglee 2 units QHS, Novolog 0-6 Q4H + 3 units TID with meals  On Cortef 20 mg in am and 10 mg with supper  Received 27 units of Novolog on 3/9.  Inpatient Diabetes Program Recommendations:    Consider increasing Semglee to 3 units BID  Consider increasing Novolog to 4 units TID with meals if eating > 50%  Follow.  Thank you. Lorenda Peck, RD, LDN, [place] Inpatient Diabetes Coordinator [phone]

## 2022-08-02 DIAGNOSIS — E162 Hypoglycemia, unspecified: Secondary | ICD-10-CM | POA: Diagnosis not present

## 2022-08-02 LAB — BASIC METABOLIC PANEL
Anion gap: 5 (ref 5–15)
BUN: 56 mg/dL — ABNORMAL HIGH (ref 6–20)
CO2: 24 mmol/L (ref 22–32)
Calcium: 8.3 mg/dL — ABNORMAL LOW (ref 8.9–10.3)
Chloride: 105 mmol/L (ref 98–111)
Creatinine, Ser: 1.48 mg/dL — ABNORMAL HIGH (ref 0.44–1.00)
GFR, Estimated: 48 mL/min — ABNORMAL LOW (ref >60)
Glucose, Bld: 309 mg/dL — ABNORMAL HIGH (ref 70–99)
Potassium: 4.9 mmol/L (ref 3.5–5.1)
Sodium: 134 mmol/L — ABNORMAL LOW (ref 135–145)

## 2022-08-02 LAB — GLUCOSE, RANDOM
Glucose, Bld: 210 mg/dL — ABNORMAL HIGH (ref 70–99)
Glucose, Bld: 555 mg/dL (ref 70–99)

## 2022-08-02 LAB — GLUCOSE, CAPILLARY
Glucose-Capillary: 101 mg/dL — ABNORMAL HIGH (ref 70–99)
Glucose-Capillary: 154 mg/dL — ABNORMAL HIGH (ref 70–99)
Glucose-Capillary: 165 mg/dL — ABNORMAL HIGH (ref 70–99)
Glucose-Capillary: 236 mg/dL — ABNORMAL HIGH (ref 70–99)
Glucose-Capillary: 317 mg/dL — ABNORMAL HIGH (ref 70–99)
Glucose-Capillary: 319 mg/dL — ABNORMAL HIGH (ref 70–99)
Glucose-Capillary: 567 mg/dL (ref 70–99)

## 2022-08-02 LAB — POTASSIUM
Potassium: 4.5 mmol/L (ref 3.5–5.1)
Potassium: 4.8 mmol/L (ref 3.5–5.1)

## 2022-08-02 MED ORDER — FREE WATER
30.0000 mL | Status: DC
  Administered 2022-08-02 – 2022-08-05 (×16): 30 mL

## 2022-08-02 MED ORDER — GLUCERNA 1.5 CAL PO LIQD
600.0000 mL | ORAL | Status: DC
  Administered 2022-08-02: 600 mL

## 2022-08-02 MED ORDER — INSULIN ASPART 100 UNIT/ML IJ SOLN
5.0000 [IU] | Freq: Once | INTRAMUSCULAR | Status: AC
  Administered 2022-08-02: 5 [IU] via SUBCUTANEOUS
  Filled 2022-08-02: qty 1

## 2022-08-02 MED ORDER — INSULIN GLARGINE-YFGN 100 UNIT/ML ~~LOC~~ SOLN
4.0000 [IU] | Freq: Two times a day (BID) | SUBCUTANEOUS | Status: DC
  Administered 2022-08-02 – 2022-08-03 (×3): 4 [IU] via SUBCUTANEOUS
  Filled 2022-08-02 (×4): qty 0.04

## 2022-08-02 NOTE — Progress Notes (Signed)
Nutrition Follow-up  DOCUMENTATION CODES:   Underweight  INTERVENTION:   -Continue MVI with minerals daily -Continue Glucerna Shake po TID, each supplement provides 220 kcal and 10 grams of protein  -Transition to nocturnal feedings via j-tube:  Glucerna 1.5 @ 70 ml/hr over 12 hour period (2000-0800)  30 ml free water flush every 4 hours  Tube feeding regimen provides 1260 kcal (74% of needs), 69 grams of protein, and 638 ml of H2O. Total free water: 818 ml daily  NUTRITION DIAGNOSIS:   Inadequate oral intake related to nausea, vomiting, poor appetite as evidenced by per patient/family report.  Ongoing  GOAL:   Patient will meet greater than or equal to 90% of their needs  Progressing   MONITOR:   PO intake, Supplement acceptance, TF tolerance  REASON FOR ASSESSMENT:   Consult Assessment of nutrition requirement/status  ASSESSMENT:   Pt with history of hypertension, insulin-dependent diabetes mellitus, hypothyroid, history of hypertension, CKD stage IIIb, adrenal insufficiency, hypothyroid, major depressive disorder, presence of jejunostomy tube, protein calorie malnutrition, who presents for chief concerns of hypoglycemia.  3/7- transitioned to ICU for insulin drip 3/8- insulin drip d/c 3/9- advanced to carb modified diet  Reviewed I/O's: +2.5 L x 24 hours and +8.9 L since admission  UOP: 700 ml x 24 hours  Pt has been tolerating TF well. Noted TF were stopped this AM. Pt has been advanced to a carb modified diet. Noted meal completions 50-100%. Pt has also been consuming Glucerna supplements.   Case discussed with MD and RN. Pt's blood sugars continue to be difficult to manage- pt had periods of hyperglycemia overnight. Plan to transition to nocturnal feedings tonight with eventual plan to discharge home on nocturnal feedings so pt can get the majority of her nutritional needs via her TF. Given pt's history of malnutrition and erratic oral intake, primary source  of long term (>30 days) enteral feedings will help pt consistently meet nutritional needs and prevent hypoglycemic episodes. Pt has j-tube access due to history of severe gastroparesis.   MD also reports desire to discharge pt home on CGM. Per prior discussion with pt, pt was discharged home on a Libre 2. Pt's phone was not compatible with sensor and pt tried her daughter's phone, but had difficulty connecting as well. Pt does have another phone she could try, however, does not have a battery for phone at this time. Case discussed with DM coordinator; they do not have any available readers, but could try a Dexcom 7, which they have available. Reader could also be obtained via prior authorization through insurance (pt now has Medicaid).   Wt has been stable since admission.   Medications reviewed and include ferrous sulfate and cortef.   Labs reviewed: CBGS: [id number] (inpatient orders for glycemic control are 0-5 units insulin aspart daily at bedtime, 0-6 units insulin aspart TID with meals, 4 units insulin aspart TID with meals, and 4 units insulin glargine-yfgn BID).    Diet Order:   Diet Order             Diet Carb Modified Fluid consistency: Thin; Room service appropriate? Yes  Diet effective now                   EDUCATION NEEDS:   Education needs have been addressed  Skin:  Skin Assessment: Reviewed RN Assessment  Last BM:  08/02/22 (type 4)  Height:   Ht Readings from Last 1 Encounters:  07/26/22 '5\' 1"'$  (1.549 m)  Weight:   Wt Readings from Last 1 Encounters:  07/30/22 43.9 kg    Ideal Body Weight:  47.7 kg  BMI:  Body mass index is 18.29 kg/m.  Estimated Nutritional Needs:   Kcal:  1700-1900  Protein:  85-100 grams  Fluid:  > 1.7 L    Loistine Chance, RD, LDN, [place] Registered Dietitian II Certified Diabetes Care and Education Specialist Please refer to [facility] for RD and/or RD on-call/weekend/after hours pager

## 2022-08-02 NOTE — Progress Notes (Signed)
PROGRESS NOTE    JACKLENE BEHRENDT   [id number] DOB: 1990/11/24  DOA: 07/27/2022 Date of Service: 08/02/22 PCP: Merryl Hacker, No     Brief Narrative / Hospital Course:  Ms. Atianna Clodfelter is a 32 year old female with history of hypertension, insulin-dependent diabetes mellitus, hypothyroid, history of hypertension, CKD stage IIIb, adrenal insufficiency, hypothyroid, major depressive disorder, presence of jejunostomy tube, protein calorie malnutrition, who presents emergency department from home via EMS on 07/27/2022 for chief concerns of hypoglycemia. Jejunostomy tube placed 03/04, reportedly had tube placed due to failure to eat. There was a concern that patient went to take a nap at 10 AM and then she went up to wake up and get a snack and she passed out. Family were concerned that she did hit her head but no obvious trauma noted Patient was significantly altered per EMS sugars were in the 30s and patient was given some D10 and sugars increased but patient is was altered on arrival to ED. She reports she did not take her insulin on date of arrival to ED. She last used insulin 07/26/22 evening. She states her blood glucose was 264 that night.  03/05: admitted to hospitalist service late evening 03/06-03/07: hypoglycemic in AM and hyperglycemic in afternoon, transitioned to q4h CBG checks, dietary following. Restarting tube feeds qhs and basal insulin daily vs home bid dose. AKI treating w/ fluids.  03/07: hyperkalemic, can't safely give lasix d/t renal fxn, can't safely give albuterol d/t prolonged QT hx, can't safeyl give kayexalate w/ bowel dysfunction - ordered another insulin + dextrose and bolus NS, renal fxn improving somewhat. K improving into the afternoon.  03/08: overnight hyperglycemic  up to 752 and K increasing to 6.1, not responding to sq insulin, stopped tube feeds and sent to stepdown for insulin gtt. Lipase slight elevated, Osm high. VBG no acidosis, started on drip then significant  hypoglycemia to 37. Stopped gtt now checking Glc hourly.  03/09: Glc more stable overnight. D/c tube feeds and trial po intake daytime. Slow up-titration of insulin to avoid hypoglycemia. Consider higher dose semglee this evening and if stays on tube feeds overnight hopefully this will prevent hypoglycemia in early am / overnight. Glc 463 at 19:30 03/10:  dipping a bit overnight (lowest 74 approx 04:00) then up again toward morning (209 at 06:30). Goal today: insulin at mealtimes only but will still monitor serum Glc along w/ K q4h blood draw, CBG and insulin ac. 03/11: increased basal insulin to Semglee 4 units bid, keep Novolog 4 units ac and add sliding scale if she eats >50% of her meals. HS dose as well per protocol. Tube feeds overnight at 44m/h glucerna. TOC/dietary to arrange CBG monitor and sensor. TOC following for DME.      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  D50 IV as needed for hypoglycemia Checking Glc q2-4h - remain in stepdown for close nursing care  Off gtt  Ideally would like her to get continuous blood glucose monitor, will d/w diabetes coordinator restart tube feeds w/ glucerna at night - tube feeds qhs to get a substantial amount of her caloric intake and then po  as able during the day w/ sliding scale. Tube feeds have been at 50 mL/h and this has avoided hypogycemia,  she was hyperglycemic yesterday after no insulin at dinner, would conitnue 4 units w/ all meals, would be ok to administer after meals and add the sliding scale dose if she eats >50% meals    Hyperkalemia - waxes and wanes Likely intercellular shift d/t labile insulin  Caution w/ lasix d/t renal fxn, caution w/ albuterol d/t prolonged QT hx, caution w/ kayexalate d/t bowel dysfunction  additional insulin + dextrose and IV fluids as needed  Closely monitor K  Telemetry   Severe gastroparesis limiting po intake  Jejunostomy feeding tube in place Appreciate dietary recs Plan for patient to have tube feeds qhs and po intake during the day as able  Anemia of chronic disease Hemoglobin is 8.4, her baseline is 9.0-9.1 over the last 3 weeks No indication for blood transfusion at this time and patient denies any clinical signs of blood loss   Acute kidney injury superimposed on chronic kidney disease stage 3b ([place]) - AKI resolved has been stable CKD  Stop fluids  Follow BMP    Adrenal insufficiency ([place]) home Solu-Cortef 10-20 mg per home dosing 20 mg in the morning, 10 mg at night   Chronic headache Prn meds as able - fioricet was not helpful, may consider nurtec or higher dose depakote though this doesn't sound like migraine headache. Will start depakote for headache prevention  Hypothyroidism TSH WNL 3.09 Levothyroxine 50 mcg daily   Insomnia Melatonin 5 mg nightly as needed   Essential hypertension Amlodipine 5 mg daily    Anxiety home fluoxetine 10 mg daily   Prolonged QT avoid scheduled QT prolonging agents Holding home mirtazapine     DVT prophylaxis: heparin - pt has declined  Pertinent IV fluids/nutrition: see above Central lines / invasive devices: jejunostomy tube   Code Status: FULL CODE   Current Admission Status: inpatient, stepdown - if ICU needs  beds would probably be ok to transfer units today but RN and I have been working together a few days on this patinet and she is requiring frequent labs / med adjustments so will leave in SDU for now.    TOC needs / Dispo plan: anticipate d/t to prevous home environment, will need tube feed supplies, GLc monitoring supplies  Barriers to discharge / significant pending items: glc stability, dietary recs, titrating tube feeds, hypo/hyperglycemia, hypoerkalemia               Subjective / Brief ROS:  Patient reports feeling okay this morning Some nausea, tolerating po  Denies CP/SOB.  Pain controlled.  Denies new weakness.   Family Communication: none at this time    Objective Findings:  Vitals:   08/02/22 0655 08/02/22 0700 08/02/22 0800 08/02/22 1000  BP: (!) 140/76  (!) 154/84 (!) 159/81  Pulse: 85 75 75 87  Resp: '16 17 16 16  '$ Temp:   98.2 F (36.8 C)   TempSrc:   Oral   SpO2: 100% 100% 100% 100%  Weight:  Intake/Output Summary (Last 24 hours) at 08/02/2022 1149 Last data filed at 08/02/2022 1036 Gross per 24 hour  Intake 3309.57 ml  Output 700 ml  Net 2609.57 ml   Filed Weights   07/29/22 0253 07/30/22 0500  Weight: 43.3 kg 43.9 kg    Examination:  Physical Exam Constitutional:      General: She is not in acute distress.    Appearance: Normal appearance.  Cardiovascular:     Rate and Rhythm: Normal rate and regular rhythm.     Heart sounds: Normal heart sounds.  Pulmonary:     Effort: Pulmonary effort is normal.     Breath sounds: Normal breath sounds.  Abdominal:     General: Abdomen is flat. Bowel sounds are normal.     Palpations: Abdomen is soft.  Skin:    General: Skin is warm and dry.  Neurological:     General: No focal deficit present.     Mental Status: She is alert and oriented to person, place, and time.  Psychiatric:        Mood and Affect: Mood normal.        Behavior: Behavior normal.          Scheduled Medications:    amLODipine  5 mg Oral Daily   Chlorhexidine Gluconate Cloth  6 each Topical Daily   divalproex  250 mg Oral Daily   feeding supplement (GLUCERNA SHAKE)  237 mL Oral TID BM   ferrous sulfate  325 mg Oral Q breakfast   FLUoxetine  10 mg Oral Daily   free water  30 mL Per Tube Q4H   heparin  5,000 Units Subcutaneous Q8H   hydrocortisone  20 mg Oral Q breakfast   And   hydrocortisone  10 mg Oral Q supper   insulin aspart  0-5 Units Subcutaneous QHS   insulin aspart  0-6 Units Subcutaneous TID WC   insulin aspart  4 Units Subcutaneous TID WC   insulin glargine-yfgn  4 Units Subcutaneous BID   levothyroxine  50 mcg Oral Q0600   multivitamin with minerals  1 tablet Oral Daily    Continuous Infusions:  sodium chloride Stopped (08/01/22 2247)   feeding supplement (GLUCERNA 1.5 CAL)      PRN Medications:  melatonin, mouth rinse, senna-docusate  Antimicrobials from admission:  Anti-infectives (From admission, onward)    None           Data Reviewed:  I have personally reviewed the following...  CBC: Recent Labs  Lab 07/27/22 1733 07/28/22 0447 07/29/22 0532  WBC 4.8 5.1 8.0  NEUTROABS 3.0  --   --   HGB 8.4* 8.7* 8.6*  HCT 24.4* 24.6* 24.5*  MCV 87.8 85.1 84.8  PLT 178 193 [id number]   Basic Metabolic Panel: Recent Labs  Lab 07/27/22 1733 07/27/22 2214 07/28/22 0447 07/30/22 1955 07/30/22 2308 07/31/22 0401 07/31/22 0404 07/31/22 0805 08/01/22 0349 08/01/22 0350 08/01/22 1619 08/01/22 2016 08/02/22 0001 08/02/22 0541 08/02/22 0952  NA 137  --    < > 132* 136  --  138  --  138  --   --   --   --   --  134*  K 4.0  --    < > 6.2* 4.6   < > 5.0   < > 4.7   < > 4.5 6.1* 4.8 4.5 4.9  CL 107  --    < > 105 109  --  109  --  110  --   --   --   --   --  105  CO2 24  --    < > 21* 22  --  22  --  23  --   --   --   --   --  24  GLUCOSE 90  --    < > 338* 207*  --  124*  --  74   < > 126* 401* 555* 210* 309*  BUN 51*  --    < > 52* 53*  --  47*  --  48*  --   --    --   --   --  56*  CREATININE 1.57*  --    < > 1.68* 1.65*  --  1.53*  --  1.46*  --   --   --   --   --  1.48*  CALCIUM 8.6*  --    < > 8.1* 8.3*  --  8.5*  --  8.1*  --   --   --   --   --  8.3*  MG 1.9  --   --   --   --   --   --   --   --   --   --   --   --   --   --   PHOS  --  3.8  --   --   --   --   --   --   --   --   --   --   --   --   --    < > = values in this interval not displayed.   GFR: Estimated Creatinine Clearance: 38.2 mL/min (A) (by C-G formula based on SCr of 1.48 mg/dL (H)). Liver Function Tests: Recent Labs  Lab 07/27/22 1733  AST 29  ALT 18  ALKPHOS 84  BILITOT 0.5  PROT 6.8  ALBUMIN 3.6   Recent Labs  Lab 07/30/22 0740  LIPASE 65*   No results for input(s): "AMMONIA" in the last 168 hours. Coagulation Profile: No results for input(s): "INR", "PROTIME" in the last 168 hours. Cardiac Enzymes: No results for input(s): "CKTOTAL", "CKMB", "CKMBINDEX", "TROPONINI" in the last 168 hours. BNP (last 3 results) No results for input(s): "PROBNP" in the last 8760 hours. HbA1C: No results for input(s): "HGBA1C" in the last 72 hours. CBG: Recent Labs  Lab 08/01/22 2330 08/02/22 0019 08/02/22 0306 08/02/22 0730 08/02/22 0951  GLUCAP 575* 567* 319* 236* 317*   Lipid Profile: No results for input(s): "CHOL", "HDL", "LDLCALC", "TRIG", "CHOLHDL", "LDLDIRECT" in the last 72 hours. Thyroid Function Tests: No results for input(s): "TSH", "T4TOTAL", "FREET4", "T3FREE", "THYROIDAB" in the last 72 hours.  Anemia Panel: No results for input(s): "VITAMINB12", "FOLATE", "FERRITIN", "TIBC", "IRON", "RETICCTPCT" in the last 72 hours.  Most Recent Urinalysis On File:     Component Value Date/Time   COLORURINE YELLOW (A) 07/27/2022 1825   APPEARANCEUR CLEAR (A) 07/27/2022 1825   APPEARANCEUR Clear 09/18/2020 1153   LABSPEC 1.015 07/27/2022 1825   LABSPEC 1.026 03/01/2013 1844   PHURINE 5.0 07/27/2022 1825   GLUCOSEU 50 (A) 07/27/2022 1825   GLUCOSEU >=500  03/01/2013 1844   HGBUR SMALL (A) 07/27/2022 1825   BILIRUBINUR NEGATIVE 07/27/2022 1825   BILIRUBINUR neg 10/23/2020 0953   BILIRUBINUR Negative 09/18/2020 1153   BILIRUBINUR Negative 03/01/2013 1844   KETONESUR NEGATIVE 07/27/2022 1825   PROTEINUR 100 (A) 07/27/2022 1825   UROBILINOGEN 0.2 10/23/2020 0953   NITRITE NEGATIVE 07/27/2022 1825   LEUKOCYTESUR NEGATIVE 07/27/2022  [place] Negative 03/01/2013 1844   Sepsis Labs: '@LABRCNTIP'$ (procalcitonin:4,lacticidven:4) Microbiology: Recent Results (from the past 240 hour(s))  Resp panel by RT-PCR (RSV, Flu A&B, Covid) Anterior Nasal Swab     Status: None   Collection Time: 07/27/22  5:34 PM   Specimen: Anterior Nasal Swab  Result Value Ref Range Status   SARS Coronavirus 2 by RT PCR NEGATIVE NEGATIVE Final    Comment: (NOTE) SARS-CoV-2 target nucleic acids are NOT DETECTED.  The SARS-CoV-2 RNA is generally detectable in upper respiratory specimens during the acute phase of infection. The lowest concentration of SARS-CoV-2 viral copies this assay can detect is 138 copies/mL. A negative result does not preclude SARS-Cov-2 infection and should not be used as the sole basis for treatment or other patient management decisions. A negative result may occur with  improper specimen collection/handling, submission of specimen other than nasopharyngeal swab, presence of viral mutation(s) within the areas targeted by this assay, and inadequate number of viral copies(<138 copies/mL). A negative result must be combined with clinical observations, patient history, and epidemiological information. The expected result is Negative.  Fact Sheet for Patients:  EntrepreneurPulse.com.au  Fact Sheet for Healthcare Providers:  IncredibleEmployment.be  This test is no t yet approved or cleared by the [place] FDA and  has been authorized for detection and/or diagnosis of SARS-CoV-2 by FDA under an  Emergency Use Authorization (EUA). This EUA will remain  in effect (meaning this test can be used) for the duration of the COVID-19 declaration under Section 564(b)(1) of the Act, 21 U.S.C.section 360bbb-3(b)(1), unless the authorization is terminated  or revoked sooner.       Influenza A by PCR NEGATIVE NEGATIVE Final   Influenza B by PCR NEGATIVE NEGATIVE Final    Comment: (NOTE) The Xpert Xpress SARS-CoV-2/FLU/RSV plus assay is intended as an aid in the diagnosis of influenza from Nasopharyngeal swab specimens and should not be used as a sole basis for treatment. Nasal washings and aspirates are unacceptable for Xpert Xpress SARS-CoV-2/FLU/RSV testing.  Fact Sheet for Patients: EntrepreneurPulse.com.au  Fact Sheet for Healthcare Providers: IncredibleEmployment.be  This test is not yet approved or cleared by the [place] FDA and has been authorized for detection and/or diagnosis of SARS-CoV-2 by FDA under an Emergency Use Authorization (EUA). This EUA will remain in effect (meaning this test can be used) for the duration of the COVID-19 declaration under Section 564(b)(1) of the Act, 21 U.S.C. section 360bbb-3(b)(1), unless the authorization is terminated or revoked.     Resp Syncytial Virus by PCR NEGATIVE NEGATIVE Final    Comment: (NOTE) Fact Sheet for Patients: EntrepreneurPulse.com.au  Fact Sheet for Healthcare Providers: IncredibleEmployment.be  This test is not yet approved or cleared by the [place] FDA and has been authorized for detection and/or diagnosis of SARS-CoV-2 by FDA under an Emergency Use Authorization (EUA). This EUA will remain in effect (meaning this test can be used) for the duration of the COVID-19 declaration under Section 564(b)(1) of the Act, 21 U.S.C. section 360bbb-3(b)(1), unless the authorization is terminated or revoked.  Performed at [facility], [address] [place], [place] [address]   MRSA Next Gen by PCR, Nasal     Status: None   Collection Time: 07/30/22  8:59 AM   Specimen: Nasal Mucosa; Nasal Swab  Result Value Ref Range Status   MRSA by PCR Next Gen NOT DETECTED NOT DETECTED Final    Comment: (NOTE) The GeneXpert MRSA Assay (FDA approved for NASAL specimens  only), is one component of a comprehensive MRSA colonization surveillance program. It is not intended to diagnose MRSA infection nor to guide or monitor treatment for MRSA infections. Test performance is not FDA approved in patients less than 75 years old. Performed at [facility], [address] [place], [place] [address]       Radiology Studies last 3 days: No results found.           LOS: 5 days    Time spent 50 min    Emeterio Reeve, DO [facility] 08/02/2022, 11:49 AM    Dictation software may have been used to generate the above note. Typos may occur and escape review in typed/dictated notes. Please contact Dr Sheppard Coil directly for clarity if needed.  Staff may message me via secure chat in [place]  but this may not receive an immediate response,  please page me for urgent matters!  If 7PM-7AM, please contact night coverage [email or web address]

## 2022-08-03 DIAGNOSIS — E162 Hypoglycemia, unspecified: Secondary | ICD-10-CM | POA: Diagnosis not present

## 2022-08-03 LAB — BASIC METABOLIC PANEL
Anion gap: 6 (ref 5–15)
BUN: 65 mg/dL — ABNORMAL HIGH (ref 6–20)
CO2: 23 mmol/L (ref 22–32)
Calcium: 8.4 mg/dL — ABNORMAL LOW (ref 8.9–10.3)
Chloride: 105 mmol/L (ref 98–111)
Creatinine, Ser: 1.45 mg/dL — ABNORMAL HIGH (ref 0.44–1.00)
GFR, Estimated: 49 mL/min — ABNORMAL LOW (ref >60)
Glucose, Bld: 316 mg/dL — ABNORMAL HIGH (ref 70–99)
Potassium: 4.5 mmol/L (ref 3.5–5.1)
Sodium: 134 mmol/L — ABNORMAL LOW (ref 135–145)

## 2022-08-03 LAB — GLUCOSE, CAPILLARY
Glucose-Capillary: 394 mg/dL — ABNORMAL HIGH (ref 70–99)
Glucose-Capillary: 332 mg/dL — ABNORMAL HIGH (ref 70–99)
Glucose-Capillary: 135 mg/dL — ABNORMAL HIGH (ref 70–99)
Glucose-Capillary: 142 mg/dL — ABNORMAL HIGH (ref 70–99)
Glucose-Capillary: 158 mg/dL — ABNORMAL HIGH (ref 70–99)
Glucose-Capillary: 108 mg/dL — ABNORMAL HIGH (ref 70–99)

## 2022-08-03 MED ORDER — GLUCERNA 1.5 CAL PO LIQD: 840.0000 mL | ORAL | Status: DC

## 2022-08-03 MED ORDER — INSULIN DETEMIR 100 UNIT/ML ~~LOC~~ SOLN
6.0000 [IU] | Freq: Every day | SUBCUTANEOUS | Status: DC
  Administered 2022-08-03 – 2022-08-04 (×2): 6 [IU] via SUBCUTANEOUS
  Filled 2022-08-03 (×3): qty 0.06

## 2022-08-03 MED ORDER — GLUCERNA 1.5 CAL PO LIQD
800.0000 mL | ORAL | Status: DC
  Administered 2022-08-03 – 2022-08-04 (×2): 800 mL

## 2022-08-03 MED ORDER — INSULIN DETEMIR 100 UNIT/ML ~~LOC~~ SOLN
4.0000 [IU] | Freq: Every morning | SUBCUTANEOUS | Status: DC
  Administered 2022-08-04 – 2022-08-05 (×2): 4 [IU] via SUBCUTANEOUS
  Filled 2022-08-03 (×2): qty 0.04

## 2022-08-03 NOTE — Inpatient Diabetes Management (Signed)
Inpatient Diabetes Program Recommendations  AACE/ADA: New Consensus Statement on Inpatient Glycemic Control (2015)  Target Ranges:  Prepandial:   less than 140 mg/dL      Peak postprandial:   less than 180 mg/dL (1-2 hours)      Critically ill patients:  140 - 180 mg/dL   Lab Results  Component Value Date   GLUCAP 135 (H) 08/03/2022   HGBA1C 10.2 (H) 02/28/2022    Inpatient Diabetes Program Recommendations:   Placed Dexcom G7 sensor on back of L arm per order Dr. Sheppard Coil. Also gave a reader to use for sensor due to patient did not have a working phone compatible for the [place] 3 or Dexcom 7 sensors. Patient had a Dexcom 6 in the past. Reviewed sensor setup with patient and will need to be changed to a new sensor in 10 days.  Thank you, Nani Gasser. Jerlene Rockers, RN, MSN, CDE  Diabetes Coordinator Inpatient Glycemic Control Team Team Pager [phone] (8am-5pm) 08/03/2022 1:35 PM

## 2022-08-03 NOTE — TOC Initial Note (Signed)
Transition of Care [facility]) - Initial/Assessment Note    Patient Details  Name: Ashley Camacho MRN: [id number] Date of Birth: [birth year]  Transition of Care [facility]) CM/SW Contact:    Tiburcio Bash, LCSW Phone Number: 08/03/2022, 11:11 AM  Clinical Narrative:                  CSW notes patient was recently discharged 1 month ago from hospital. At the time a group home was found for patient to dc to however she declined day of discharge.   Patient has no pcp and therefore [place] services were unable to be set up at the time, patient reported she would follow up with [facility].   CSW has worked with [employer] today for delivery of tube feeds (shipping today) , patient will go home with 3 days worth of tube feeds and pump to be delivered at bedside tomorrow day of discharge.    Expected Discharge Plan: [place] Barriers to Discharge: Continued Medical Work up   Patient Goals and CMS Choice   CMS Medicare.gov Compare Post Acute Care list provided to:: Patient Choice offered to / list presented to : Patient      Expected Discharge Plan and Services       Living arrangements for the past 2 months: Single Family Home                                      Prior Living Arrangements/Services Living arrangements for the past 2 months: Single Family Home Lives with:: Relatives                   Activities of Daily Living Home Assistive Devices/Equipment: CBG Meter ADL Screening (condition at time of admission) Patient's cognitive ability adequate to safely complete daily activities?: No Is the patient deaf or have difficulty hearing?: No Does the patient have difficulty seeing, even when wearing glasses/contacts?: No Does the patient have difficulty concentrating, remembering, or making decisions?: No Patient able to express need for assistance with ADLs?: Yes Does the patient have difficulty dressing or bathing?: Yes (weakness) Independently  performs ADLs?: Yes (appropriate for developmental age) Communication: Independent Dressing (OT): Independent Is this a change from baseline?: Pre-admission baseline Grooming: Needs assistance, Independent with device (comment) Is this a change from baseline?: Pre-admission baseline Feeding: Independent Bathing: Needs assistance Is this a change from baseline?: Change from baseline, expected to last <3 days Toileting: Needs assistance Is this a change from baseline?: Change from baseline, expected to last <3 days In/Out Bed: Dependent Is this a change from baseline?: Change from baseline, expected to last <3 days Walks in Home: Independent Does the patient have difficulty walking or climbing stairs?: Yes Weakness of Legs: Both Weakness of Arms/Hands: Both  Permission Sought/Granted                  Emotional Assessment              Admission diagnosis:  Syncope and collapse [R55] Hypoglycemia [E16.2] Prolonged QT interval [R94.31] Patient Active Problem List   Diagnosis Date Noted   Hypothermia 07/27/2022   Stage 3b chronic kidney disease ([place]) 06/29/2022   Hypovolemic shock ([place]) 06/28/2022   Jejunostomy tube present ([place]) 06/28/2022   Insomnia 06/20/2022   Hypophosphatemia 06/13/2022   Protein-calorie malnutrition, severe 06/13/2022   Hypotension 06/10/2022   Pseudohyponatremia 06/10/2022   Hypomagnesemia 06/10/2022  Adrenal insufficiency ([place]) 06/09/2022   Acute gastroenteritis 02/28/2022   Hypokalemia 02/28/2022   Hypocalcemia [id number]   Metabolic acidosis [id number]   Essential hypertension 02/28/2022   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   Nausea & vomiting 02/28/2022   UTI (urinary tract infection) 11/03/2021   Hypoglycemia due to type 1 diabetes mellitus ([place]) 11/02/2021   Chronic kidney disease, stage 3b ([place]) 11/02/2021   Thrombocytopenia ([place]) 11/02/2021   Hypoglycemia due to insulin 06/14/2021   DKA (diabetic ketoacidosis) ([place]) 06/14/2021    CKD stage 3 due to type 1 diabetes mellitus ([place]) 06/14/2021   Transaminitis 06/14/2021   Hypoglycemia associated with diabetes ([place]) 06/03/2021   DKA, type 1 ([place]) 05/20/2021   Major depressive disorder, recurrent episode, moderate ([place]) 05/08/2021   Right arm pain 05/07/2021   Rash of hand 05/07/2021   Hyperkalemia 05/06/2021   History of adrenal insufficiency 05/06/2021   CAP (community acquired pneumonia) 05/05/2021   Hypoglycemia 04/27/2021   Postoperative hematoma involving genitourinary system following genitourinary procedure    S/P cesarean section 02/17/2021   Acute esophagitis    Leg edema    History of preterm delivery, currently pregnant    Pre-existing type 1 diabetes mellitus during pregnancy in third trimester    Anemia of chronic disease    Type 1 diabetes mellitus affecting pregnancy in second trimester, antepartum    HFrEF (heart failure with reduced ejection fraction) ([place])    Acute deep vein thrombosis (DVT) of brachial vein of right upper extremity ([place]) 12/17/2020   Anxiety 12/17/2020   Diabetic retinopathy ([place]) 12/17/2020   Chronic combined systolic and diastolic CHF (congestive heart failure) ([place])    Hyperemesis 12/13/2020   Type 1 diabetes mellitus with hypoglycemia without coma ([place]) 12/13/2020   Sunburn 12/13/2020   Hypothyroidism 12/10/2020   Malnutrition ([place]) 12/10/2020   Vitreous hemorrhage of right eye ([place]) 12/10/2020   Pleural effusion associated with pulmonary infection 11/25/2020   Pneumonia affecting pregnancy 11/24/2020   Diabetes mellitus affecting pregnancy, second trimester 11/24/2020   Edema 11/24/2020   Decreased urine output 11/24/2020   Shortness of breath    Zinc deficiency    SOB (shortness of breath)    Social problem 11/21/2020   Nausea and vomiting during pregnancy prior to [redacted] weeks gestation 11/21/2020   Low serum vitamin B12    Delivery with history of C-section 11/20/2020   Absolute anemia    Acute kidney injury  superimposed on chronic kidney disease (HCC)    Nausea and vomiting during pregnancy 10/23/2020   History of premature delivery, currently pregnant, second trimester 10/13/2020   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   Peripheral neuropathy 09/28/2018   Gastroparesis 04/22/2016   Diabetic sensorimotor neuropathy ([place]) 04/19/2016   DKA (diabetic ketoacidoses) 04/03/2016   Microalbuminuria 09/26/2005   PCP:  Merryl Hacker, No Pharmacy:   [place] [address] Phone: [phone] Fax: [phone]     Social Determinants of Health (SDOH) Social History: SDOH Screenings   Food Insecurity: No Food Insecurity (07/29/2022)  Housing: Low Risk  (07/29/2022)  Transportation Needs: No Transportation Needs (07/29/2022)  Utilities: Not At Risk (07/29/2022)  Depression (PHQ2-9): Low Risk  (11/11/2020)  Tobacco Use: Low Risk  (07/27/2022)   SDOH Interventions:     Readmission Risk Interventions    11/03/2021   10:37 AM 06/16/2021   12:30 PM 06/03/2021    4:26 PM  Readmission Risk Prevention Plan  Transportation Screening Complete Complete Complete  Medication Review Press photographer)  Complete Complete Complete  PCP or Specialist appointment within 3-5 days of discharge Complete Complete Complete  HRI or Home Care Consult Not Complete  Not Complete  HRI or Home Care Consult Pt Refusal Comments NA  NA  SW Recovery Care/Counseling Consult Not Complete Complete Complete  SW Consult Not Complete Comments NA    Palliative Care Screening Not Applicable Not Applicable Not Applicable  Skilled Nursing Facility Not Applicable Not Applicable Not Applicable

## 2022-08-03 NOTE — Progress Notes (Signed)
Nutrition Follow-up  DOCUMENTATION CODES:   Underweight  INTERVENTION:   -Continue MVI with minerals daily -Continue Glucerna Shake po TID, each supplement provides 220 kcal and 10 grams of protein  -Nocturnal feedings via j-tube:   Glucerna 1.5 @ 100 ml/hr over 12 hour period (0000-0800)   30 ml free water flush every 4 hours   Tube feeding regimen provides 1200 kcal (70% of needs), 66 grams of protein, and 607 ml of H2O. Total free water: 787 ml daily  NUTRITION DIAGNOSIS:   Inadequate oral intake related to nausea, vomiting, poor appetite as evidenced by per patient/family report.  Ongoing  GOAL:   Patient will meet greater than or equal to 90% of their needs  Progressing   MONITOR:   PO intake, Supplement acceptance, TF tolerance  REASON FOR ASSESSMENT:   Consult Assessment of nutrition requirement/status  ASSESSMENT:   Pt with history of hypertension, insulin-dependent diabetes mellitus, hypothyroid, history of hypertension, CKD stage IIIb, adrenal insufficiency, hypothyroid, major depressive disorder, presence of jejunostomy tube, protein calorie malnutrition, who presents for chief concerns of hypoglycemia.  3/7- transitioned to ICU for insulin drip 3/8- insulin drip d/c 3/9- advanced to carb modified diet   Reviewed I/O's: +1.3 L x 24 hours and +10.1 L since admission   Case discussed with TOC and Dr. Sheppard Coil. Plan for pt to discharge home tomorrow. Home health is being set up.   Spoke with pt and Dr. Sheppard Coil. Pt reports tolerating TF well, but expresses concern about erratic blood sugars and managing TF at home. Pt shares that sleep schedule is erratic secondary to caring for her one year old and not having a consistent sleep schedule. Pt thinks that she would be able to infuse TF for 8 hour continuously. Discussed that TF rate would need to be increased due to shorter infusion time; will trial tonight to see if she tolerates. Plan is for pt to get the  majority of of her nutritional needs via TF secondary to erratic intake. Given pt's history of malnutrition and erratic oral intake, primary source of long term (>30 days) enteral feedings will help pt consistently meet nutritional needs and prevent hypoglycemic episodes. Pt has j-tube access due to history of severe gastroparesis.   No new wt since last visit.   Per DM coordinator, Dexcom G7 sensor placed and was also given a reader. Pt complains of reader not calibrating; reached out again to diabetes coordinator.   Medications reviewed and include depakote, ferrous sulfate, and cortef.  Labs reviewed: CBGS: 135-332 (inpatient orders for glycemic control are 0-5 units insulin aspart daily at bedtime, 0-6 units insulin aspart TID with meals, 4 units insulin aspart TID with meals, 4 units insulin determir every morning, and 6 units insulin detemir daily at bedtime).    Diet Order:   Diet Order             Diet Carb Modified Fluid consistency: Thin; Room service appropriate? Yes  Diet effective now                   EDUCATION NEEDS:   Education needs have been addressed  Skin:  Skin Assessment: Reviewed RN Assessment  Last BM:  08/03/22 (type 4)  Height:   Ht Readings from Last 1 Encounters:  07/26/22 '5\' 1"'$  (1.549 m)    Weight:   Wt Readings from Last 1 Encounters:  07/30/22 43.9 kg    Ideal Body Weight:  47.7 kg  BMI:  Body mass index is 18.29  kg/m.  Estimated Nutritional Needs:   Kcal:  1700-1900  Protein:  85-100 grams  Fluid:  > 1.7 L    Ashley Camacho, RD, LDN, [place] Registered Dietitian II Certified Diabetes Care and Education Specialist Please refer to [facility] for RD and/or RD on-call/weekend/after hours pager

## 2022-08-03 NOTE — Progress Notes (Signed)
PROGRESS NOTE    Ashley Camacho   [id number] DOB: April 15, 1991  DOA: 07/27/2022 Date of Service: 08/03/22 PCP: Merryl Hacker, No     Brief Narrative / Hospital Course:  [name] is a 32 year old female with history of hypertension, insulin-dependent diabetes mellitus, hypothyroid, history of hypertension, CKD stage IIIb, adrenal insufficiency, hypothyroid, major depressive disorder, presence of jejunostomy tube, protein calorie malnutrition, who presents emergency department from home via EMS on 07/27/2022 for chief concerns of hypoglycemia. Jejunostomy tube placed 03/04, reportedly had tube placed due to failure to eat. There was a concern that patient went to take a nap at 10 AM and then she went up to wake up and get a snack and she passed out. Family were concerned that she did hit her head but no obvious trauma noted Patient was significantly altered per EMS sugars were in the 30s and patient was given some D10 and sugars increased but patient is was altered on arrival to ED. She reports she did not take her insulin on date of arrival to ED. She last used insulin 07/26/22 evening. She states her blood glucose was 264 that night.  03/05: admitted to hospitalist service late evening 03/06-03/07: hypoglycemic in AM and hyperglycemic in afternoon, transitioned to q4h CBG checks, dietary following. Restarting tube feeds qhs and basal insulin daily vs home bid dose. AKI treating w/ fluids.  03/07: hyperkalemic, can't safely give lasix d/t renal fxn, can't safely give albuterol d/t prolonged QT hx, can't safeyl give kayexalate w/ bowel dysfunction - ordered another insulin + dextrose and bolus NS, renal fxn improving somewhat. K improving into the afternoon.  03/08: overnight hyperglycemic  up to 752 and K increasing to 6.1, not responding to sq insulin, stopped tube feeds and sent to stepdown for insulin gtt. Lipase slight elevated, Osm high. VBG no acidosis, started on drip then significant  hypoglycemia to 37. Stopped gtt now checking Glc hourly.  03/09: Glc more stable overnight. D/c tube feeds and trial po intake daytime. Slow up-titration of insulin to avoid hypoglycemia. Consider higher dose semglee this evening and if stays on tube feeds overnight hopefully this will prevent hypoglycemia in early am / overnight. Glc 463 at 19:30 03/10:  dipping a bit overnight (lowest 74 approx 04:00) then up again toward morning (209 at 06:30). Goal today: insulin at mealtimes only but will still monitor serum Glc along w/ K q4h blood draw, CBG and insulin ac. 03/11: increased basal insulin to Semglee 4 units bid, keep Novolog 4 units ac and add sliding scale if she eats >50% of her meals. HS dose as well per protocol. Tube feeds overnight at 55m/h glucerna. TOC/dietary to arrange CBG monitor and sensor. TOC following for DME.   03/12:  Got Dexcom CBG monitor w/ extra sensors and w/ reading device - supplies should last about 40 days (DM educator is JFelecia Shelling.  Changing Semglee 4 units bid to Levemir 4 units AM, 6 units PM.  Adjusting tube feeds to 8 h qhs which she will be able to replicate at home, will try 100 mL/h x8h to cover 70% of her calorie needs (she has 1yo child w/ erratic sleep schedule so is not reasonably able to do 12 hours tube feeds). Dietary is following (Dietician is JSocial research officer, governmentW.)  Novolog 4 units w/ ALL meals, sand sliding scale on top of that if eats >50% of meals.  Sliding scale also w/ Novolog qhs.  To do:  make sure she has tube feed pump and  knows how to use it - TOC working on this (SW is Aileen Pilot.), also getting home health set up.  I tried calling [facility] again to advocate for them taking this patient back to their practice if possible - Dr Gabriel Carina and I spoke a few days ago and she recommended patient go to [facility] or [facility], pt had been d/c d/t no-shows - I think her no-shows were out of her control at the time due to family issues (she was relying on others  for transportation), and currently she has other physicians at [facility] and she lives in [place] - she would have a lot of difficulty getting to [facility] or [facility] - I left message for the practice administrator to call my cell.     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  D50 IV as needed for hypoglycemia Checking Glc q2-4h - remain in stepdown for close nursing care  Off gtt  Tube feeds as above  Adjust insulin as needed but ideally need to replicate what she can do at home    Hyperkalemia - waxes and wanes Likely intercellular shift d/t labile insulin  Caution w/ lasix d/t renal fxn, caution w/ albuterol d/t prolonged QT hx, caution w/ kayexalate d/t bowel dysfunction  additional insulin + dextrose and IV fluids as needed  Closely monitor K  Telemetry   Severe gastroparesis limiting po intake  Jejunostomy feeding tube in place Appreciate dietary recs Plan for patient to have tube feeds qhs and po intake during the day as able - she has been doing well w/ po during the day   Anemia of chronic disease Hemoglobin is 8.4, her baseline is 9.0-9.1 over the last 3 weeks No indication for blood transfusion at this time and patient denies any clinical signs of blood loss    Acute kidney injury superimposed on chronic kidney disease stage 3b ([place]) - AKI resolved has been stable CKD  Stop fluids  Follow BMP    Adrenal insufficiency ([place]) home Solu-Cortef 10-20 mg per home dosing 20 mg in the morning, 10 mg at night   Chronic headache Prn meds as able - fioricet was not helpful, may consider nurtec or higher dose depakote though this doesn't sound like migraine headache. Will start depakote for headache prevention  Hypothyroidism TSH WNL 3.09 Levothyroxine 50 mcg daily   Insomnia Melatonin 5 mg nightly as needed   Essential hypertension Amlodipine 5 mg daily    Anxiety home fluoxetine 10 mg daily   Prolonged QT avoid scheduled QT prolonging agents Holding home mirtazapine     DVT prophylaxis: heparin - pt has declined  Pertinent IV fluids/nutrition: see above Central lines / invasive devices: jejunostomy tube   Code Status: FULL  CODE   Current Admission Status: inpatient, stepdown - if ICU needs beds would be ok to transfer units - she is requiring frequent labs / med adjustments so will leave in SDU for now.    TOC needs / Dispo plan: anticipate d/c to prevous home environment, will need tube feed supplies, Glc monitoring supplies, follow up w/ endocrine  Barriers to discharge / significant pending items: glc stability, dietary recs, titrating tube feeds, home health, endocrine f/u              Subjective / Brief ROS:  Patient reports feeling okay this morning Some nausea, tolerating po  Denies CP/SOB.  Pain controlled.  Denies new weakness.   Family Communication: none at this time    Objective Findings:  Vitals:   08/03/22 0800 08/03/22 0900 08/03/22 0904 08/03/22 1000  BP:   (!) 146/77   Pulse: 78 88 84 88  Resp: '14 20 17 '$ (!) 21  Temp: 98 F (36.7 C)     TempSrc: Oral     SpO2: 100% 100% 100% 100%  Weight:        Intake/Output Summary (Last 24 hours) at 08/03/2022 1544 Last data filed at 08/03/2022  0600 Gross per 24 hour  Intake 965 ml  Output --  Net 965 ml   Filed Weights   07/29/22 0253 07/30/22 0500  Weight: 43.3 kg 43.9 kg    Examination:  Physical Exam Constitutional:      General: She is not in acute distress.    Appearance: Normal appearance.  Cardiovascular:     Rate and Rhythm: Normal rate and regular rhythm.     Heart sounds: Normal heart sounds.  Pulmonary:     Effort: Pulmonary effort is normal.     Breath sounds: Normal breath sounds.  Abdominal:     General: Abdomen is flat. Bowel sounds are normal.     Palpations: Abdomen is soft.  Skin:    General: Skin is warm and dry.  Neurological:     General: No focal deficit present.     Mental Status: She is alert and oriented to person, place, and time.  Psychiatric:        Mood and Affect: Mood normal.        Behavior: Behavior normal.          Scheduled Medications:   amLODipine  5 mg Oral Daily   Chlorhexidine Gluconate Cloth  6 each Topical Daily   divalproex  250 mg Oral Daily   feeding supplement (GLUCERNA SHAKE)  237 mL Oral TID BM   ferrous sulfate  325 mg Oral Q breakfast   FLUoxetine  10 mg Oral Daily   free water  30 mL Per Tube Q4H   hydrocortisone  20 mg Oral Q breakfast   And   hydrocortisone  10 mg Oral Q supper   insulin aspart  0-5 Units Subcutaneous QHS   insulin aspart  0-6 Units Subcutaneous TID WC   insulin aspart  4 Units Subcutaneous TID WC   [START ON 08/04/2022] insulin detemir  4 Units Subcutaneous q AM   insulin detemir  6 Units Subcutaneous QHS   levothyroxine  50 mcg Oral Q0600   multivitamin with minerals  1 tablet Oral Daily    Continuous Infusions:  feeding supplement (GLUCERNA 1.5 CAL)      PRN Medications:  melatonin, mouth rinse, senna-docusate  Antimicrobials from admission:  Anti-infectives (From admission, onward)    None  Data Reviewed:  I have personally reviewed the following...  CBC: Recent Labs  Lab 07/27/22 1733  07/28/22 0447 07/29/22 0532  WBC 4.8 5.1 8.0  NEUTROABS 3.0  --   --   HGB 8.4* 8.7* 8.6*  HCT 24.4* 24.6* 24.5*  MCV 87.8 85.1 84.8  PLT 178 193 [id number]   Basic Metabolic Panel: Recent Labs  Lab 07/27/22 1733 07/27/22 2214 07/28/22 0447 07/30/22 2308 07/31/22 0401 07/31/22 0404 07/31/22 0805 08/01/22 0349 08/01/22 0350 08/01/22 2016 08/02/22 0001 08/02/22 0541 08/02/22 0952 08/03/22 0505  NA 137  --    < > 136  --  138  --  138  --   --   --   --  134* 134*  K 4.0  --    < > 4.6   < > 5.0   < > 4.7   < > 6.1* 4.8 4.5 4.9 4.5  CL 107  --    < > 109  --  109  --  110  --   --   --   --  105 105  CO2 24  --    < > 22  --  22  --  23  --   --   --   --  24 23  GLUCOSE 90  --    < > 207*  --  124*  --  74   < > 401* 555* 210* 309* 316*  BUN 51*  --    < > 53*  --  47*  --  48*  --   --   --   --  56* 65*  CREATININE 1.57*  --    < > 1.65*  --  1.53*  --  1.46*  --   --   --   --  1.48* 1.45*  CALCIUM 8.6*  --    < > 8.3*  --  8.5*  --  8.1*  --   --   --   --  8.3* 8.4*  MG 1.9  --   --   --   --   --   --   --   --   --   --   --   --   --   PHOS  --  3.8  --   --   --   --   --   --   --   --   --   --   --   --    < > = values in this interval not displayed.   GFR: Estimated Creatinine Clearance: 39 mL/min (A) (by C-G formula based on SCr of 1.45 mg/dL (H)). Liver Function Tests: Recent Labs  Lab 07/27/22 1733  AST 29  ALT 18  ALKPHOS 84  BILITOT 0.5  PROT 6.8  ALBUMIN 3.6   Recent Labs  Lab 07/30/22 0740  LIPASE 65*   No results for input(s): "AMMONIA" in the last 168 hours. Coagulation Profile: No results for input(s): "INR", "PROTIME" in the last 168 hours. Cardiac Enzymes: No results for input(s): "CKTOTAL", "CKMB", "CKMBINDEX", "TROPONINI" in the last 168 hours. BNP (last 3 results) No results for input(s): "PROBNP" in the last 8760 hours. HbA1C: No results for input(s): "HGBA1C" in the last 72 hours. CBG: Recent Labs  Lab 08/02/22 2101  08/03/22 0303 08/03/22 0732 08/03/22 1134 08/03/22 1410  GLUCAP 165* 394* 332* 135* 142*   Lipid Profile: No results for input(s): "CHOL", "HDL", "LDLCALC", "  TRIG", "CHOLHDL", "LDLDIRECT" in the last 72 hours. Thyroid Function Tests: No results for input(s): "TSH", "T4TOTAL", "FREET4", "T3FREE", "THYROIDAB" in the last 72 hours.  Anemia Panel: No results for input(s): "VITAMINB12", "FOLATE", "FERRITIN", "TIBC", "IRON", "RETICCTPCT" in the last 72 hours.  Most Recent Urinalysis On File:     Component Value Date/Time   COLORURINE YELLOW (A) 07/27/2022 1825   APPEARANCEUR CLEAR (A) 07/27/2022 1825   APPEARANCEUR Clear 09/18/2020 1153   LABSPEC 1.015 07/27/2022 1825   LABSPEC 1.026 03/01/2013 1844   PHURINE 5.0 07/27/2022 1825   GLUCOSEU 50 (A) 07/27/2022 1825   GLUCOSEU >=500 03/01/2013 1844   HGBUR SMALL (A) 07/27/2022 1825   BILIRUBINUR NEGATIVE 07/27/2022 1825   BILIRUBINUR neg 10/23/2020 0953   BILIRUBINUR Negative 09/18/2020 1153   BILIRUBINUR Negative 03/01/2013 1844   KETONESUR NEGATIVE 07/27/2022 1825   PROTEINUR 100 (A) 07/27/2022 1825   UROBILINOGEN 0.2 10/23/2020 0953   NITRITE NEGATIVE 07/27/2022 1825   LEUKOCYTESUR NEGATIVE 07/27/2022 1825   LEUKOCYTESUR Negative 03/01/2013 1844   Sepsis Labs: '@LABRCNTIP'$ (procalcitonin:4,lacticidven:4) Microbiology: Recent Results (from the past 240 hour(s))  Resp panel by RT-PCR (RSV, Flu A&B, Covid) Anterior Nasal Swab     Status: None   Collection Time: 07/27/22  5:34 PM   Specimen: Anterior Nasal Swab  Result Value Ref Range Status   SARS Coronavirus 2 by RT PCR NEGATIVE NEGATIVE Final    Comment: (NOTE) SARS-CoV-2 target nucleic acids are NOT DETECTED.  The SARS-CoV-2 RNA is generally detectable in upper respiratory specimens during the acute phase of infection. The lowest concentration of SARS-CoV-2 viral copies this assay can detect is 138 copies/mL. A negative result does not preclude SARS-Cov-2 infection and  should not be used as the sole basis for treatment or other patient management decisions. A negative result may occur with  improper specimen collection/handling, submission of specimen other than nasopharyngeal swab, presence of viral mutation(s) within the areas targeted by this assay, and inadequate number of viral copies(<138 copies/mL). A negative result must be combined with clinical observations, patient history, and epidemiological information. The expected result is Negative.  Fact Sheet for Patients:  EntrepreneurPulse.com.au  Fact Sheet for Healthcare Providers:  IncredibleEmployment.be  This test is no t yet approved or cleared by the [place] FDA and  has been authorized for detection and/or diagnosis of SARS-CoV-2 by FDA under an Emergency Use Authorization (EUA). This EUA will remain  in effect (meaning this test can be used) for the duration of the COVID-19 declaration under Section 564(b)(1) of the Act, 21 U.S.C.section 360bbb-3(b)(1), unless the authorization is terminated  or revoked sooner.       Influenza A by PCR NEGATIVE NEGATIVE Final   Influenza B by PCR NEGATIVE NEGATIVE Final    Comment: (NOTE) The Xpert Xpress SARS-CoV-2/FLU/RSV plus assay is intended as an aid in the diagnosis of influenza from Nasopharyngeal swab specimens and should not be used as a sole basis for treatment. Nasal washings and aspirates are unacceptable for Xpert Xpress SARS-CoV-2/FLU/RSV testing.  Fact Sheet for Patients: EntrepreneurPulse.com.au  Fact Sheet for Healthcare Providers: IncredibleEmployment.be  This test is not yet approved or cleared by the [place] FDA and has been authorized for detection and/or diagnosis of SARS-CoV-2 by FDA under an Emergency Use Authorization (EUA). This EUA will remain in effect (meaning this test can be used) for the duration of the COVID-19 declaration  under Section 564(b)(1) of the Act, 21 U.S.C. section 360bbb-3(b)(1), unless the authorization is terminated or revoked.  Resp Syncytial Virus by PCR NEGATIVE NEGATIVE Final    Comment: (NOTE) Fact Sheet for Patients: EntrepreneurPulse.com.au  Fact Sheet for Healthcare Providers: IncredibleEmployment.be  This test is not yet approved or cleared by the [place] FDA and has been authorized for detection and/or diagnosis of SARS-CoV-2 by FDA under an Emergency Use Authorization (EUA). This EUA will remain in effect (meaning this test can be used) for the duration of the COVID-19 declaration under Section 564(b)(1) of the Act, 21 U.S.C. section 360bbb-3(b)(1), unless the authorization is terminated or revoked.  Performed at [facility], [address] [place], [place] [address]   MRSA Next Gen by PCR, Nasal     Status: None   Collection Time: 07/30/22  8:59 AM   Specimen: Nasal Mucosa; Nasal Swab  Result Value Ref Range Status   MRSA by PCR Next Gen NOT DETECTED NOT DETECTED Final    Comment: (NOTE) The GeneXpert MRSA Assay (FDA approved for NASAL specimens only), is one component of a comprehensive MRSA colonization surveillance program. It is not intended to diagnose MRSA infection nor to guide or monitor treatment for MRSA infections. Test performance is not FDA approved in patients less than 13 years old. Performed at [facility], [address], [place], [place] [address]       Radiology Studies last 3 days: No results found.           LOS: 6 days    Time spent 50 min    Emeterio Reeve, DO [facility] 08/03/2022, 3:44 PM    Dictation software may have been used to generate the above note. Typos may occur and escape review in typed/dictated notes. Please contact Dr Sheppard Coil directly for clarity if needed.  Staff may message me via secure chat in [employer]  but this may not  receive an immediate response,  please page me for urgent matters!  If 7PM-7AM, please contact night coverage [email or web address]

## 2022-08-04 ENCOUNTER — Other Ambulatory Visit: Payer: Self-pay

## 2022-08-04 DIAGNOSIS — E440 Moderate protein-calorie malnutrition: Secondary | ICD-10-CM | POA: Diagnosis not present

## 2022-08-04 DIAGNOSIS — Z931 Gastrostomy status: Secondary | ICD-10-CM | POA: Diagnosis not present

## 2022-08-04 DIAGNOSIS — E162 Hypoglycemia, unspecified: Secondary | ICD-10-CM | POA: Diagnosis not present

## 2022-08-04 DIAGNOSIS — E109 Type 1 diabetes mellitus without complications: Secondary | ICD-10-CM | POA: Diagnosis not present

## 2022-08-04 DIAGNOSIS — K3184 Gastroparesis: Secondary | ICD-10-CM | POA: Diagnosis not present

## 2022-08-04 DIAGNOSIS — N1832 Chronic kidney disease, stage 3b: Secondary | ICD-10-CM | POA: Diagnosis not present

## 2022-08-04 LAB — BASIC METABOLIC PANEL
Anion gap: 5 (ref 5–15)
BUN: 65 mg/dL — ABNORMAL HIGH (ref 6–20)
CO2: 25 mmol/L (ref 22–32)
Calcium: 8.3 mg/dL — ABNORMAL LOW (ref 8.9–10.3)
Chloride: 106 mmol/L (ref 98–111)
Creatinine, Ser: 1.51 mg/dL — ABNORMAL HIGH (ref 0.44–1.00)
GFR, Estimated: 47 mL/min — ABNORMAL LOW (ref >60)
Glucose, Bld: 275 mg/dL — ABNORMAL HIGH (ref 70–99)
Potassium: 4.6 mmol/L (ref 3.5–5.1)
Sodium: 136 mmol/L (ref 135–145)

## 2022-08-04 LAB — GLUCOSE, CAPILLARY
Glucose-Capillary: 241 mg/dL — ABNORMAL HIGH (ref 70–99)
Glucose-Capillary: 256 mg/dL — ABNORMAL HIGH (ref 70–99)
Glucose-Capillary: 249 mg/dL — ABNORMAL HIGH (ref 70–99)
Glucose-Capillary: 100 mg/dL — ABNORMAL HIGH (ref 70–99)
Glucose-Capillary: 44 mg/dL — CL (ref 70–99)
Glucose-Capillary: 130 mg/dL — ABNORMAL HIGH (ref 70–99)
Glucose-Capillary: 45 mg/dL — ABNORMAL LOW (ref 70–99)
Glucose-Capillary: 119 mg/dL — ABNORMAL HIGH (ref 70–99)
Glucose-Capillary: 329 mg/dL — ABNORMAL HIGH (ref 70–99)

## 2022-08-04 MED ORDER — HYDROCORTISONE 10 MG PO TABS
ORAL_TABLET | ORAL | 0 refills | Status: DC
  Filled 2022-08-04: qty 90, 30d supply, fill #0

## 2022-08-04 MED ORDER — INSULIN DETEMIR 100 UNIT/ML ~~LOC~~ SOLN
4.0000 [IU] | Freq: Every morning | SUBCUTANEOUS | 11 refills | Status: DC
  Filled 2022-08-04: qty 10, 250d supply, fill #0

## 2022-08-04 MED ORDER — BASAGLAR KWIKPEN 100 UNIT/ML ~~LOC~~ SOPN
4.0000 [IU] | PEN_INJECTOR | Freq: Two times a day (BID) | SUBCUTANEOUS | 0 refills | Status: DC
  Filled 2022-08-04: qty 15, 188d supply, fill #0

## 2022-08-04 MED ORDER — HYDROCORTISONE 20 MG PO TABS
20.0000 mg | ORAL_TABLET | Freq: Every day | ORAL | 0 refills | Status: DC
  Filled 2022-08-04: qty 30, 30d supply, fill #0

## 2022-08-04 MED ORDER — DIVALPROEX SODIUM ER 250 MG PO TB24
250.0000 mg | ORAL_TABLET | Freq: Every day | ORAL | 1 refills | Status: DC
  Filled 2022-08-04: qty 30, 30d supply, fill #0

## 2022-08-04 MED ORDER — DEXTROSE 50 % IV SOLN: 25.0000 g | Freq: Once | INTRAVENOUS | Status: AC

## 2022-08-04 MED ORDER — SENNOSIDES-DOCUSATE SODIUM 8.6-50 MG PO TABS
1.0000 | ORAL_TABLET | Freq: Every evening | ORAL | 1 refills | Status: DC | PRN
  Filled 2022-08-04: qty 30, 30d supply, fill #0

## 2022-08-04 MED ORDER — INSULIN DETEMIR 100 UNIT/ML FLEXPEN
PEN_INJECTOR | SUBCUTANEOUS | 11 refills | Status: DC
  Filled 2022-08-04: qty 15, 90d supply, fill #0

## 2022-08-04 MED ORDER — DEXTROSE 50 % IV SOLN
INTRAVENOUS | Status: AC
  Administered 2022-08-04: 25 g via INTRAVENOUS
  Filled 2022-08-04: qty 50

## 2022-08-04 NOTE — Progress Notes (Signed)
Blood sugar 130 after IV D50.

## 2022-08-04 NOTE — Discharge Summary (Addendum)
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 is a 32 year old female with history of hypertension, insulin-dependent diabetes mellitus, hypothyroid, history of hypertension, CKD stage IIIb, adrenal insufficiency, hypothyroid, major depressive disorder, presence of jejunostomy tube, protein calorie malnutrition, who presents emergency department from home via EMS on 07/27/2022 for chief concerns of hypoglycemia. Jejunostomy tube placed 03/04, reportedly had tube placed due to failure to eat. There was a concern that patient went to take a nap at 10 AM and then she went up to wake up and get a snack and she passed out. Family were concerned that she did hit her head but no obvious trauma noted Patient was significantly altered per EMS sugars were in the 30s and patient was given some D10 and sugars increased. She states her blood glucose was 264 that night.  During her hospital stay patient had very brittle glucose control requiring calibration her Dexcom.  Her Lantus was changed from 4 units twice daily to the 4 units in the morning and 6 units of Levemir at night.  Informed by pharmacist that we do not have Levemir in stock now.  Patient will go back to her Lantus. Glucose is now better controlled.  Tube feeding was adjusted to 100  mL/h x 8 hours to cover 70% of her caloric needs.  She has undergone tube feeding medication as well as insulin medication and DM medication.  She has been cleared for discharge today and to follow-up with her cardiologist, endocrinologist and primary care physician.      Consultants: Diabetic manager  Procedures performed: None Disposition: Home Diet recommendation:  Discharge Diet Orders (From admission, onward)     Start     Ordered   08/04/22 0000  Diet - low sodium heart healthy        08/04/22 1402   08/04/22 0000  Diet - low sodium heart healthy        08/04/22 1404   08/04/22 0000  Diet Carb Modified        08/04/22 1404           Carb modified diet DISCHARGE MEDICATION: Allergies as of 08/04/2022   No Known Allergies      Medication List     STOP taking these medications    Basaglar KwikPen 100 UNIT/ML       TAKE these medications    Acetaminophen Extra Strength 500 MG Tabs Take 2 tablets (1,000 mg total) by mouth every 6 (six) hours as needed for headache, fever or moderate pain.   amLODipine 5 MG tablet Commonly known as: NORVASC Take 1 tablet (5 mg total) by mouth daily.   divalproex 250 MG 24 hr tablet Commonly known as: DEPAKOTE ER Take 1 tablet (250 mg total) by mouth daily. Start taking on: August 05, 2022   feeding supplement (GLUCERNA SHAKE) Liqd Take 237 mLs by mouth 3 (three) times daily  between meals.   FeroSul 325 (65 FE) MG tablet Generic drug: ferrous sulfate Take 1 tablet (325 mg total) by mouth daily with breakfast.   FLUoxetine 10 MG capsule Commonly known as: PROZAC Take 1 capsule (10 mg total) by mouth daily.   hydrocortisone 10 MG tablet Commonly known as: CORTEF Take 2 tablets (20 mg total) by mouth daily with breakfast AND 1 tablet (10 mg total) daily with supper. What changed: You were already taking a medication with the same name, and this prescription was added. Make sure you understand how and when to take  each.   hydrocortisone 20 MG tablet Commonly known as: CORTEF Take 1 tablet (20 mg total) by mouth daily with breakfast. Start taking on: August 05, 2022 What changed:  medication strength how much to take when to take this additional instructions   insulin aspart 100 UNIT/ML injection Commonly known as: NovoLOG Inject 2 Units into the skin 3 (three) times daily with meals.   insulin aspart 100 UNIT/ML injection Commonly known as: novoLOG Inject 0-5 Units into the skin at bedtime.   NovoLOG FlexPen 100 UNIT/ML FlexPen Generic drug: insulin aspart Inject 2 units into the skin 3 (three) times daily with meals. In addition, inject 0-9 Units into the skin 3 (three) times daily with meals based on your blood sugar. Also inject 0-5 units into the skin every night at bedtime based on your blood sugar. See attached directions.   insulin detemir 100 UNIT/ML injection Commonly known as: LEVEMIR Inject 0.06 mLs (6 Units total) into the skin at bedtime.   insulin detemir 100 UNIT/ML injection Commonly known as: LEVEMIR Inject 0.04 mLs (4 Units total) into the skin in the morning. Start taking on: August 05, 2022   levothyroxine 50 MCG tablet Commonly known as: SYNTHROID Take 1 tablet (50 mcg total) by mouth daily at 6 (six) AM.   melatonin 5 MG Tabs Take 1 tablet (5 mg total) by mouth at bedtime as needed.   mirtazapine 15 MG tablet Commonly known as: REMERON Take 1 tablet (15 mg total) by mouth at bedtime.   multivitamin with minerals Tabs tablet Take 1 tablet by mouth daily.   promethazine 25 MG tablet Commonly known as: PHENERGAN Take 0.5 tablets (12.5 mg total) by mouth every 6 (six) hours as needed for nausea or vomiting.   senna-docusate 8.6-50 MG tablet Commonly known as: Senokot-S Take 1 tablet by mouth at bedtime as needed for mild constipation.               Durable Medical Equipment  (From admission, onward)           Start     Ordered   08/03/22  1434  For home use only DME Tube feeding  Once       Comments: nocturnal feedings via j-tube: Will need to infuse 800 ml of formula over the evening     Glucerna 1.5 @ 70 ml/hr over 12 hour period (2000-0800) or Glucerna 1.5 @ 100 ml/hr over 8 hour period   30 ml free water flush every 4 hours   Tube feeding regimen provides 1260 kcal (74% of needs), 69 grams of protein, and 638 ml of H2O. Total free water: 818 ml daily  Pt will need TF pump and supplies   08/03/22 1434            Follow-up Information     Solum, Betsey Holiday, MD Follow up.   Specialty: Endocrinology Contact information: [address]  [place] [address] [phone]         Nelva Bush, MD Follow up.   Specialty: Cardiology Contact information: [place]  [address] [phone]                Discharge Exam: Danley Danker Weights   07/29/22 0253 07/30/22 0500  Weight: 43.3 kg 43.9 kg   Constitutional:      General: She is not in acute distress.    Appearance: Normal appearance.  Cardiovascular:     Rate and Rhythm: Normal rate and regular rhythm.     Heart sounds: Normal heart sounds.  Pulmonary:     Effort: Pulmonary effort is normal.     Breath sounds: Normal breath sounds.  Abdominal:     General: Abdomen is flat. Bowel sounds are normal.     Palpations: Abdomen is soft.  J-tube in place Skin:    General: Skin is warm and dry.  Neurological:     General: No focal deficit present.     Mental Status: She is alert and oriented to person, place, and time.  Psychiatric:        Mood and Affect: Mood normal.        Behavior: Behavior normal.   Condition at discharge: good  Discharge time spent: greater than 30 minutes.  Signed: Verline Lema, MD [facility] 08/04/2022    Patient's discharge was withheld  given hypoglycemia.

## 2022-08-04 NOTE — Progress Notes (Addendum)
Blood sugar not increasing after orange juice.  Nurse spoke with Dr. Rande Brunt about hypoglycemia.  Order for 1 ampule of D 50 ordered and given.

## 2022-08-04 NOTE — Progress Notes (Signed)
Patient called out via call bell and alerted nurse that blood sugar was low via dexcom glucometer reading 54.  Patient blood sugar checked with hospital glucometer and was 54.  163m of orange juice given to patient. Patient alert and oriented with only complaint of feeling hungry and "slightly nauseated".

## 2022-08-04 NOTE — Inpatient Diabetes Management (Signed)
Inpatient Diabetes Program Recommendations  AACE/ADA: New Consensus Statement on Inpatient Glycemic Control (2015)  Target Ranges:  Prepandial:   less than 140 mg/dL      Peak postprandial:   less than 180 mg/dL (1-2 hours)      Critically ill patients:  140 - 180 mg/dL   Lab Results  Component Value Date   GLUCAP 100 (H) 08/04/2022   HGBA1C 10.2 (H) 02/28/2022   Inpatient Diabetes Program Recommendations:   Re calibrated Dexcom reader with current CBG. Very close in readings. Reviewed with patient Dexcom sensor will continue to have some variability due to measuring interstitial fluid instead of blood glucose so will have 15 minutes variability.  No other needs identitified @ this time.  Thank you, Ashley Camacho. Ashley Erdmann, RN, MSN, CDE  Diabetes Coordinator Inpatient Glycemic Control Team Team Pager [phone] (8am-5pm) 08/04/2022 1:02 PM

## 2022-08-05 ENCOUNTER — Other Ambulatory Visit: Payer: Self-pay

## 2022-08-05 DIAGNOSIS — E162 Hypoglycemia, unspecified: Secondary | ICD-10-CM | POA: Diagnosis not present

## 2022-08-05 LAB — GLUCOSE, CAPILLARY
Glucose-Capillary: 230 mg/dL — ABNORMAL HIGH (ref 70–99)
Glucose-Capillary: 314 mg/dL — ABNORMAL HIGH (ref 70–99)
Glucose-Capillary: 424 mg/dL — ABNORMAL HIGH (ref 70–99)
Glucose-Capillary: 72 mg/dL (ref 70–99)

## 2022-08-05 MED ORDER — NOVOLOG FLEXPEN 100 UNIT/ML ~~LOC~~ SOPN: 0.0000 [IU] | PEN_INJECTOR | Freq: Three times a day (TID) | SUBCUTANEOUS | 11 refills | Status: DC

## 2022-08-05 MED ORDER — BASAGLAR KWIKPEN 100 UNIT/ML ~~LOC~~ SOPN: 4.0000 [IU] | PEN_INJECTOR | Freq: Two times a day (BID) | SUBCUTANEOUS | 0 refills | Status: DC

## 2022-08-05 MED ORDER — MELATONIN 5 MG PO TABS: 5.0000 mg | ORAL_TABLET | Freq: Every evening | ORAL | 0 refills | Status: AC | PRN

## 2022-08-05 MED ORDER — AMLODIPINE BESYLATE 5 MG PO TABS
5.0000 mg | ORAL_TABLET | Freq: Every day | ORAL | 0 refills | Status: DC
  Filled 2022-08-05: qty 30, 30d supply, fill #0

## 2022-08-05 MED ORDER — LEVOTHYROXINE SODIUM 50 MCG PO TABS: 50.0000 ug | ORAL_TABLET | Freq: Every day | ORAL | 0 refills | Status: DC

## 2022-08-05 MED ORDER — FERROUS SULFATE 325 (65 FE) MG PO TABS: 325.0000 mg | ORAL_TABLET | Freq: Every day | ORAL | 0 refills | Status: DC

## 2022-08-05 MED ORDER — PROMETHAZINE HCL 25 MG PO TABS
12.5000 mg | ORAL_TABLET | Freq: Four times a day (QID) | ORAL | 0 refills | Status: DC | PRN
  Filled 2022-08-05: qty 15, 8d supply, fill #0

## 2022-08-05 MED ORDER — GLUCERNA SHAKE PO LIQD: 237.0000 mL | Freq: Three times a day (TID) | ORAL | 0 refills | Status: AC

## 2022-08-05 MED ORDER — FERROUS SULFATE 325 (65 FE) MG PO TABS
325.0000 mg | ORAL_TABLET | Freq: Every day | ORAL | 0 refills | Status: DC
  Filled 2022-08-05: qty 30, 30d supply, fill #0

## 2022-08-05 MED ORDER — INSULIN ASPART 100 UNIT/ML IJ SOLN
4.0000 [IU] | Freq: Once | INTRAMUSCULAR | Status: AC
  Administered 2022-08-05: 4 [IU] via SUBCUTANEOUS

## 2022-08-05 MED ORDER — LEVOTHYROXINE SODIUM 50 MCG PO TABS
50.0000 ug | ORAL_TABLET | Freq: Every day | ORAL | 0 refills | Status: DC
  Filled 2022-08-05: qty 30, 30d supply, fill #0

## 2022-08-05 MED ORDER — FLUOXETINE HCL 10 MG PO CAPS: 10.0000 mg | ORAL_CAPSULE | Freq: Every day | ORAL | 0 refills | Status: DC

## 2022-08-05 MED ORDER — PROMETHAZINE HCL 25 MG PO TABS: 12.5000 mg | ORAL_TABLET | Freq: Four times a day (QID) | ORAL | 0 refills | Status: DC | PRN

## 2022-08-05 MED ORDER — SENNOSIDES-DOCUSATE SODIUM 8.6-50 MG PO TABS: 1.0000 | ORAL_TABLET | Freq: Every evening | ORAL | 1 refills | Status: DC | PRN

## 2022-08-05 MED ORDER — MELATONIN 5 MG PO TABS
5.0000 mg | ORAL_TABLET | Freq: Every evening | ORAL | 0 refills | Status: DC | PRN
  Filled 2022-08-05: qty 30, 30d supply, fill #0

## 2022-08-05 MED ORDER — HYDROCORTISONE 10 MG PO TABS: ORAL_TABLET | ORAL | 0 refills | Status: DC

## 2022-08-05 MED ORDER — AMLODIPINE BESYLATE 5 MG PO TABS: 5.0000 mg | ORAL_TABLET | Freq: Every day | ORAL | 0 refills | Status: DC

## 2022-08-05 MED ORDER — ADULT MULTIVITAMIN W/MINERALS CH: 1.0000 | ORAL_TABLET | Freq: Every day | ORAL | 0 refills | Status: AC

## 2022-08-05 MED ORDER — INSULIN ASPART 100 UNIT/ML IJ SOLN: 0.0000 [IU] | Freq: Every day | INTRAMUSCULAR | 0 refills | Status: DC

## 2022-08-05 MED ORDER — HYDROCORTISONE 20 MG PO TABS: 20.0000 mg | ORAL_TABLET | Freq: Every day | ORAL | 0 refills | Status: AC

## 2022-08-05 MED ORDER — INSULIN ASPART 100 UNIT/ML IJ SOLN: 4.0000 [IU] | Freq: Once | INTRAMUSCULAR | Status: DC

## 2022-08-05 MED ORDER — FLUOXETINE HCL 10 MG PO CAPS
10.0000 mg | ORAL_CAPSULE | Freq: Every day | ORAL | 0 refills | Status: DC
  Filled 2022-08-05: qty 30, 30d supply, fill #0

## 2022-08-05 MED ORDER — ACETAMINOPHEN 500 MG PO TABS: 1000.0000 mg | ORAL_TABLET | Freq: Four times a day (QID) | ORAL | 3 refills | Status: AC | PRN

## 2022-08-05 MED ORDER — INSULIN ASPART 100 UNIT/ML IJ SOLN: 2.0000 [IU] | Freq: Three times a day (TID) | INTRAMUSCULAR | 0 refills | Status: DC

## 2022-08-05 MED ORDER — NOVOLOG FLEXPEN 100 UNIT/ML ~~LOC~~ SOPN
0.0000 [IU] | PEN_INJECTOR | Freq: Three times a day (TID) | SUBCUTANEOUS | 11 refills | Status: DC
  Filled 2022-08-05: qty 15, 39d supply, fill #0

## 2022-08-05 MED ORDER — INSULIN ASPART 100 UNIT/ML IJ SOLN
0.0000 [IU] | Freq: Every day | INTRAMUSCULAR | 0 refills | Status: DC
  Filled 2022-08-05: qty 1.5, 30d supply, fill #0

## 2022-08-05 MED ORDER — DIVALPROEX SODIUM ER 250 MG PO TB24: 250.0000 mg | ORAL_TABLET | Freq: Every day | ORAL | 1 refills | Status: DC

## 2022-08-05 MED ORDER — INSULIN ASPART 100 UNIT/ML IJ SOLN
2.0000 [IU] | Freq: Three times a day (TID) | INTRAMUSCULAR | 0 refills | Status: DC
  Filled 2022-08-05: qty 10, 167d supply, fill #0

## 2022-08-05 NOTE — TOC Transition Note (Signed)
Transition of Care [facility]) - CM/SW Discharge Note   Patient Details  Name: SHULAMIS STUARD MRN: [id number] Date of Birth: 04-Oct-1990  Transition of Care [facility]) CM/SW Contact:  Tiburcio Bash, LCSW Phone Number: 08/05/2022, 1:23 PM   Clinical Narrative:      CSW has worked with [employer] for delivery of tube feeds that was shipped yesterday, patient will go home with 3 days worth of tube feeds and pump was delivered to bedside yesterday in preparation of discharge however due to patient's sugars she stayed one more night for observation. Patient to discharge home today with all dme needs, no additional needs noted at this time.         Final next level of care: Home/Self Care Barriers to Discharge: No Barriers Identified   Patient Goals and CMS Choice CMS Medicare.gov Compare Post Acute Care list provided to:: Patient Choice offered to / list presented to : Patient  Discharge Placement                      Patient and family notified of of transfer: 08/05/22  Discharge Plan and Services Additional resources added to the After Visit Summary for                                       Social Determinants of Health (SDOH) Interventions SDOH Screenings   Food Insecurity: No Food Insecurity (07/29/2022)  Housing: Low Risk  (07/29/2022)  Transportation Needs: No Transportation Needs (07/29/2022)  Utilities: Not At Risk (07/29/2022)  Depression (PHQ2-9): Low Risk  (11/11/2020)  Tobacco Use: Low Risk  (07/27/2022)     Readmission Risk Interventions    11/03/2021   10:37 AM 06/16/2021   12:30 PM 06/03/2021    4:26 PM  Readmission Risk Prevention Plan  Transportation Screening Complete Complete Complete  Medication Review Press photographer) Complete Complete Complete  PCP or Specialist appointment within 3-5 days of discharge Complete Complete Complete  HRI or Home Care Consult Not Complete  Not Complete  HRI or Home Care Consult Pt Refusal Comments NA  NA  SW Recovery  Care/Counseling Consult Not Complete Complete Complete  SW Consult Not Complete Comments NA    Palliative Care Screening Not Applicable Not Applicable Not [facility] Not Applicable Not Applicable Not Applicable

## 2022-08-05 NOTE — Progress Notes (Signed)
Progress Note   Patient: Ashley Camacho [id number] DOB: 04/21/1991 DOA: 07/27/2022     8 DOS: the patient was seen and examined on 08/05/2022    Subjective: Patient seen and examined at bedside this morning Denies any complaints, Denies nausea vomiting abdominal pain or chest pain  Brief Narrative / Hospital Course:  Ashley Camacho is a 32 year old female with history of hypertension, insulin-dependent diabetes mellitus, hypothyroid, history of hypertension, CKD stage IIIb, adrenal insufficiency, hypothyroid, major depressive disorder, presence of jejunostomy tube, protein calorie malnutrition, who presents emergency department from home via EMS on 07/27/2022 for chief concerns of hypoglycemia. Jejunostomy tube placed 03/04, reportedly had tube placed due to failure to eat. There was a concern that patient went to take a nap at 10 AM and then she went up to wake up and get a snack and she passed out. Family were concerned that she did hit her head but no obvious trauma noted Patient was significantly altered per EMS sugars were in the 30s and patient was given some D10 and sugars increased but patient is was altered on arrival to ED. She reports she did not take her insulin on date of arrival to ED. during hospitalization  glucose level was difficult to control.  She has been encouraged on how to use her insulin underwent diabetic education as well as tube feeding instructions with flashes.  Her sugars have now remained stable on and therefore being discharged to follow-up with endocrinologist as well as cardiologist.    Consultants:  none   Procedures: none     Assessment and plan: Hypoglycemia/Hyperglycemia EXTREMELY BRITTLE type 1 diabetes mellitus ([place]) Hyperkalemia - waxes and wanes Severe gastroparesis limiting po intake  Jejunostomy feeding tube in place Anemia of chronic disease Acute kidney injury superimposed on chronic kidney disease stage 3b ([place]) - AKI resolved has  been stable CKD  Adrenal insufficiency (HCC) Chronic headache Hypothyroidism Insomnia Essential hypertension Anxiety Prolonged QT  Plan Patient is being discharged today We will continue current discharge medications She is planning to follow-up with cardiologist here in [facility] as well as endocrinologist   DVT prophylaxis: heparin - pt has declined  Pertinent IV fluids/nutrition: see above Central lines / invasive devices: jejunostomy tube    Code Status: FULL CODE          Physical Exam: Constitutional:      General: She is not in acute distress.    Appearance: Normal appearance.  Cardiovascular:     Rate and Rhythm: Normal rate and regular rhythm.     Heart sounds: Normal heart sounds.  Pulmonary:     Effort: Pulmonary effort is normal.     Breath sounds: Normal breath sounds.  Abdominal:     General: Abdomen is flat. Bowel sounds are normal.  G-tube in place Skin:    General: Skin is warm and dry.  Neurological:     General: No focal deficit present.     Mental Status: She is alert and oriented to person, place, and time.  Psychiatric:        Mood and Affect: Mood normal.        Behavior: Behavior normal.    Vitals:   08/05/22 0957 08/05/22 0958 08/05/22 0959 08/05/22 1000  BP:      Pulse:      Resp: '20 19 15 15  '$ Temp:      TempSrc:      SpO2:      Weight:  Data Reviewed: Laboratory results reviewed by me as well as blood glucose levels   Family Communication: No family at bedside  Time spent: 35 minutes  Author: Verline Lema, MD 08/05/2022 1:45 PM  For on call review [email or web address].

## 2022-08-10 DIAGNOSIS — N184 Chronic kidney disease, stage 4 (severe): Secondary | ICD-10-CM | POA: Diagnosis not present

## 2022-08-10 DIAGNOSIS — E1010 Type 1 diabetes mellitus with ketoacidosis without coma: Secondary | ICD-10-CM | POA: Diagnosis not present

## 2022-08-10 DIAGNOSIS — D638 Anemia in other chronic diseases classified elsewhere: Secondary | ICD-10-CM | POA: Diagnosis not present

## 2022-08-10 DIAGNOSIS — R7989 Other specified abnormal findings of blood chemistry: Secondary | ICD-10-CM | POA: Diagnosis not present

## 2022-08-10 DIAGNOSIS — E46 Unspecified protein-calorie malnutrition: Secondary | ICD-10-CM | POA: Diagnosis not present

## 2022-08-10 DIAGNOSIS — K3184 Gastroparesis: Secondary | ICD-10-CM | POA: Diagnosis not present

## 2022-08-10 DIAGNOSIS — E1143 Type 2 diabetes mellitus with diabetic autonomic (poly)neuropathy: Secondary | ICD-10-CM | POA: Diagnosis not present

## 2022-08-10 DIAGNOSIS — I5020 Unspecified systolic (congestive) heart failure: Secondary | ICD-10-CM | POA: Diagnosis not present

## 2022-08-11 ENCOUNTER — Other Ambulatory Visit: Payer: Self-pay | Admitting: Gastroenterology

## 2022-08-11 DIAGNOSIS — E1143 Type 2 diabetes mellitus with diabetic autonomic (poly)neuropathy: Secondary | ICD-10-CM

## 2022-08-16 ENCOUNTER — Encounter: Payer: Medicaid Other | Admitting: Surgery

## 2022-08-23 ENCOUNTER — Encounter: Payer: Self-pay | Admitting: Surgery

## 2022-08-23 ENCOUNTER — Ambulatory Visit (INDEPENDENT_AMBULATORY_CARE_PROVIDER_SITE_OTHER): Payer: Medicaid Other | Admitting: Surgery

## 2022-08-23 ENCOUNTER — Other Ambulatory Visit: Payer: Self-pay

## 2022-08-23 VITALS — BP 121/84 | HR 91 | Temp 98.0°F | Ht 61.0 in | Wt 96.8 lb

## 2022-08-23 DIAGNOSIS — Z419 Encounter for procedure for purposes other than remedying health state, unspecified: Secondary | ICD-10-CM | POA: Diagnosis not present

## 2022-08-23 DIAGNOSIS — I509 Heart failure, unspecified: Secondary | ICD-10-CM | POA: Insufficient documentation

## 2022-08-23 DIAGNOSIS — E440 Moderate protein-calorie malnutrition: Secondary | ICD-10-CM | POA: Diagnosis not present

## 2022-08-23 DIAGNOSIS — K3184 Gastroparesis: Secondary | ICD-10-CM

## 2022-08-23 DIAGNOSIS — Z09 Encounter for follow-up examination after completed treatment for conditions other than malignant neoplasm: Secondary | ICD-10-CM

## 2022-08-23 DIAGNOSIS — N1832 Chronic kidney disease, stage 3b: Secondary | ICD-10-CM | POA: Diagnosis not present

## 2022-08-23 DIAGNOSIS — Z931 Gastrostomy status: Secondary | ICD-10-CM | POA: Diagnosis not present

## 2022-08-23 DIAGNOSIS — E109 Type 1 diabetes mellitus without complications: Secondary | ICD-10-CM | POA: Diagnosis not present

## 2022-08-23 DIAGNOSIS — E1110 Type 2 diabetes mellitus with ketoacidosis without coma: Secondary | ICD-10-CM | POA: Insufficient documentation

## 2022-08-23 NOTE — Progress Notes (Signed)
S/p open jejunostomy for malnutrition and severe gastroparesis Irritation around the stitch site Tube working well  PE chronically ill in NAD Abd: soft, nt, J tube in place.  Under is sterile technique I cut the old stitch holding the tube and place a new 1 to the left of the red rubber catheter.  The old stitch was putting some torque.  I made sure that I loosen the tube and the stitch laid much better.  A/p RTC 1 month Agree w current GI w/u

## 2022-08-23 NOTE — Patient Instructions (Addendum)
Please call the office if you have any questions or concerns.  We will contact you in May to schedule a 2 month follow up appointment/.

## 2022-09-04 DIAGNOSIS — K3184 Gastroparesis: Secondary | ICD-10-CM | POA: Diagnosis not present

## 2022-09-04 DIAGNOSIS — N1832 Chronic kidney disease, stage 3b: Secondary | ICD-10-CM | POA: Diagnosis not present

## 2022-09-04 DIAGNOSIS — E440 Moderate protein-calorie malnutrition: Secondary | ICD-10-CM | POA: Diagnosis not present

## 2022-09-04 DIAGNOSIS — E109 Type 1 diabetes mellitus without complications: Secondary | ICD-10-CM | POA: Diagnosis not present

## 2022-09-04 DIAGNOSIS — Z931 Gastrostomy status: Secondary | ICD-10-CM | POA: Diagnosis not present

## 2022-09-10 ENCOUNTER — Ambulatory Visit: Payer: Medicaid Other

## 2022-09-22 DIAGNOSIS — Z419 Encounter for procedure for purposes other than remedying health state, unspecified: Secondary | ICD-10-CM | POA: Diagnosis not present

## 2022-09-22 DIAGNOSIS — E109 Type 1 diabetes mellitus without complications: Secondary | ICD-10-CM | POA: Diagnosis not present

## 2022-09-22 DIAGNOSIS — N1832 Chronic kidney disease, stage 3b: Secondary | ICD-10-CM | POA: Diagnosis not present

## 2022-09-22 DIAGNOSIS — Z931 Gastrostomy status: Secondary | ICD-10-CM | POA: Diagnosis not present

## 2022-09-22 DIAGNOSIS — K3184 Gastroparesis: Secondary | ICD-10-CM | POA: Diagnosis not present

## 2022-09-22 DIAGNOSIS — E440 Moderate protein-calorie malnutrition: Secondary | ICD-10-CM | POA: Diagnosis not present

## 2022-09-24 ENCOUNTER — Encounter: Admission: RE | Admit: 2022-09-24 | Payer: Medicaid Other | Source: Ambulatory Visit

## 2022-09-29 ENCOUNTER — Ambulatory Visit: Payer: Medicaid Other | Admitting: Dietician

## 2022-10-01 ENCOUNTER — Other Ambulatory Visit: Payer: Self-pay

## 2022-10-01 ENCOUNTER — Emergency Department
Admission: EM | Admit: 2022-10-01 | Discharge: 2022-10-02 | Disposition: A | Discharge status: Home / Self Care | Payer: Medicaid Other | Attending: Emergency Medicine | Admitting: Emergency Medicine

## 2022-10-01 ENCOUNTER — Encounter: Payer: Self-pay | Admitting: Emergency Medicine

## 2022-10-01 ENCOUNTER — Emergency Department: Payer: Medicaid Other

## 2022-10-01 DIAGNOSIS — L03311 Cellulitis of abdominal wall: Secondary | ICD-10-CM | POA: Diagnosis not present

## 2022-10-01 DIAGNOSIS — I10 Essential (primary) hypertension: Secondary | ICD-10-CM | POA: Insufficient documentation

## 2022-10-01 DIAGNOSIS — E1065 Type 1 diabetes mellitus with hyperglycemia: Secondary | ICD-10-CM | POA: Insufficient documentation

## 2022-10-01 DIAGNOSIS — R109 Unspecified abdominal pain: Secondary | ICD-10-CM | POA: Diagnosis not present

## 2022-10-01 DIAGNOSIS — L089 Local infection of the skin and subcutaneous tissue, unspecified: Secondary | ICD-10-CM | POA: Diagnosis present

## 2022-10-01 DIAGNOSIS — N179 Acute kidney failure, unspecified: Secondary | ICD-10-CM | POA: Diagnosis not present

## 2022-10-01 DIAGNOSIS — N3289 Other specified disorders of bladder: Secondary | ICD-10-CM | POA: Diagnosis not present

## 2022-10-01 DIAGNOSIS — E1165 Type 2 diabetes mellitus with hyperglycemia: Secondary | ICD-10-CM | POA: Diagnosis not present

## 2022-10-01 DIAGNOSIS — R739 Hyperglycemia, unspecified: Secondary | ICD-10-CM

## 2022-10-01 LAB — COMPREHENSIVE METABOLIC PANEL
ALT: 39 U/L (ref 0–44)
AST: 78 U/L — ABNORMAL HIGH (ref 15–41)
Albumin: 4.2 g/dL (ref 3.5–5.0)
Alkaline Phosphatase: 68 U/L (ref 38–126)
Anion gap: 8 (ref 5–15)
BUN: 29 mg/dL — ABNORMAL HIGH (ref 6–20)
CO2: 24 mmol/L (ref 22–32)
Calcium: 8.7 mg/dL — ABNORMAL LOW (ref 8.9–10.3)
Chloride: 100 mmol/L (ref 98–111)
Creatinine, Ser: 1.64 mg/dL — ABNORMAL HIGH (ref 0.44–1.00)
GFR, Estimated: 43 mL/min — ABNORMAL LOW (ref >60)
Glucose, Bld: 513 mg/dL (ref 70–99)
Potassium: 6.2 mmol/L — ABNORMAL HIGH (ref 3.5–5.1)
Sodium: 132 mmol/L — ABNORMAL LOW (ref 135–145)
Total Bilirubin: 0.6 mg/dL (ref 0.3–1.2)
Total Protein: 6.9 g/dL (ref 6.5–8.1)

## 2022-10-01 LAB — CBC WITH DIFFERENTIAL/PLATELET
Abs Immature Granulocytes: 0.01 10*3/uL (ref 0.00–0.07)
Basophils Absolute: 0.1 10*3/uL (ref 0.0–0.1)
Basophils Relative: 1 %
Eosinophils Absolute: 0.4 10*3/uL (ref 0.0–0.5)
Eosinophils Relative: 7 %
HCT: 24.1 % — ABNORMAL LOW (ref 36.0–46.0)
Hemoglobin: 8.3 g/dL — ABNORMAL LOW (ref 12.0–15.0)
Immature Granulocytes: 0 %
Lymphocytes Relative: 24 %
Lymphs Abs: 1.3 10*3/uL (ref 0.7–4.0)
MCH: 31.1 pg (ref 26.0–34.0)
MCHC: 34.4 g/dL (ref 30.0–36.0)
MCV: 90.3 fL (ref 80.0–100.0)
Monocytes Absolute: 0.3 10*3/uL (ref 0.1–1.0)
Monocytes Relative: 5 %
Neutro Abs: 3.6 10*3/uL (ref 1.7–7.7)
Neutrophils Relative %: 63 %
Platelets: 149 10*3/uL — ABNORMAL LOW (ref 150–400)
RBC: 2.67 MIL/uL — ABNORMAL LOW (ref 3.87–5.11)
RDW: 11.9 % (ref 11.5–15.5)
WBC: 5.7 10*3/uL (ref 4.0–10.5)
nRBC: 0 % (ref 0.0–0.2)

## 2022-10-01 LAB — BASIC METABOLIC PANEL
Anion gap: 6 (ref 5–15)
BUN: 28 mg/dL — ABNORMAL HIGH (ref 6–20)
CO2: 22 mmol/L (ref 22–32)
Calcium: 8.1 mg/dL — ABNORMAL LOW (ref 8.9–10.3)
Chloride: 105 mmol/L (ref 98–111)
Creatinine, Ser: 1.55 mg/dL — ABNORMAL HIGH (ref 0.44–1.00)
GFR, Estimated: 46 mL/min — ABNORMAL LOW (ref >60)
Glucose, Bld: 372 mg/dL — ABNORMAL HIGH (ref 70–99)
Potassium: 4.3 mmol/L (ref 3.5–5.1)
Sodium: 133 mmol/L — ABNORMAL LOW (ref 135–145)

## 2022-10-01 LAB — URINALYSIS, ROUTINE W REFLEX MICROSCOPIC
Bacteria, UA: NONE SEEN
Bilirubin Urine: NEGATIVE
Glucose, UA: >=500 mg/dL — AB
Hgb urine dipstick: NEGATIVE
Ketones, ur: NEGATIVE mg/dL
Leukocytes,Ua: NEGATIVE
Nitrite: NEGATIVE
Protein, ur: NEGATIVE mg/dL
Specific Gravity, Urine: 1.015 (ref 1.005–1.030)
pH: 5 (ref 5.0–8.0)

## 2022-10-01 LAB — LIPASE, BLOOD: Lipase: 45 U/L (ref 11–51)

## 2022-10-01 LAB — LACTIC ACID, PLASMA
Lactic Acid, Venous: 1 mmol/L (ref 0.5–1.9)
Lactic Acid, Venous: 1 mmol/L (ref 0.5–1.9)

## 2022-10-01 LAB — PREGNANCY, URINE: Preg Test, Ur: NEGATIVE

## 2022-10-01 MED ORDER — DOXYCYCLINE HYCLATE 50 MG PO CAPS: 100.0000 mg | ORAL_CAPSULE | Freq: Two times a day (BID) | ORAL | 0 refills | Status: AC

## 2022-10-01 MED ORDER — DOXYCYCLINE HYCLATE 100 MG PO TABS
100.0000 mg | ORAL_TABLET | Freq: Once | ORAL | Status: AC
  Administered 2022-10-01: 100 mg via ORAL
  Filled 2022-10-01: qty 1

## 2022-10-01 MED ORDER — SODIUM CHLORIDE 0.9 % IV BOLUS
1000.0000 mL | Freq: Once | INTRAVENOUS | Status: AC
  Administered 2022-10-01: 1000 mL via INTRAVENOUS

## 2022-10-01 MED ORDER — INSULIN ASPART 100 UNIT/ML IJ SOLN
5.0000 [IU] | Freq: Once | INTRAMUSCULAR | Status: AC
  Administered 2022-10-01: 5 [IU] via INTRAVENOUS
  Filled 2022-10-01: qty 1

## 2022-10-01 MED ORDER — IOHEXOL 300 MG/ML  SOLN
75.0000 mL | Freq: Once | INTRAMUSCULAR | Status: AC | PRN
  Administered 2022-10-01: 75 mL via INTRAVENOUS

## 2022-10-01 NOTE — ED Triage Notes (Signed)
Pt via POV from home. Pt c/o irritation, pain, and redness near her G-tube for the past week. States she think it may be infected. Denies fevers or chills. Pt is A&Ox4 and NAD

## 2022-10-01 NOTE — ED Provider Notes (Signed)
[facility] Provider Note    Event Date/Time   First MD Initiated Contact with Patient 10/01/22 2019     (approximate)   History   Wound Check   HPI  Ashley Camacho is a 32 y.o. female   Past medical history of type I diabetic, gastroparesis with G-tube, hypertension, adrenal insufficiency, who presents to the emergency department with concerns for infection around her G-tube site.  It is red and irritated and painful.  This developed over the last 1 week.  It is worsening.  She has no systemic infectious symptoms like fevers or chills.  Her G-tube still fully functional.  She denies any nausea vomiting or diarrhea.  She did not take her insulin today.  She has been having poor p.o. intake, and thus has been reducing her insulin intake. External Medical Documents Reviewed: Surgery note from January 2024 from Dr. Everlene Farrier for gj tube placement      Physical Exam   Triage Vital Signs: ED Triage Vitals  Enc Vitals Group     BP 10/01/22 1749 107/65     Pulse Rate 10/01/22 1749 87     Resp 10/01/22 1749 14     Temp 10/01/22 1749 98.4 F (36.9 C)     Temp Source 10/01/22 2226 Oral     SpO2 10/01/22 1749 98 %     Weight 10/01/22 1737 96 lb (43.5 kg)     Height 10/01/22 1737 5\' 1"  (1.549 m)     Head Circumference --      Peak Flow --      Pain Score 10/01/22 1736 8     Pain Loc --      Pain Edu? --      Excl. in GC? --     Most recent vital signs: Vitals:   10/01/22 2031 10/01/22 2226  BP: 137/79   Pulse: 77   Resp: 19   Temp:  97.8 F (36.6 C)  SpO2: 100%     General: Awake, no distress.  CV:  Good peripheral perfusion.  Resp:  Normal effort.  Abd:  No distention.  Other:  There are some scant purulence from the G-tube site and surrounding erythematous changes approximately 1 cm surrounding the site.  Otherwise abdomen is soft and nontender.  No fever.   ED Results / Procedures / Treatments   Labs (all labs ordered are listed, but  only abnormal results are displayed) Labs Reviewed  COMPREHENSIVE METABOLIC PANEL - Abnormal; Notable for the following components:      Result Value   Sodium 132 (*)    Potassium 6.2 (*)    Glucose, Bld 513 (*)    BUN 29 (*)    Creatinine, Ser 1.64 (*)    Calcium 8.7 (*)    AST 78 (*)    GFR, Estimated 43 (*)    All other components within normal limits  CBC WITH DIFFERENTIAL/PLATELET - Abnormal; Notable for the following components:   RBC 2.67 (*)    Hemoglobin 8.3 (*)    HCT 24.1 (*)    Platelets 149 (*)    All other components within normal limits  URINALYSIS, ROUTINE W REFLEX MICROSCOPIC - Abnormal; Notable for the following components:   Color, Urine YELLOW (*)    APPearance CLEAR (*)    Glucose, UA >=500 (*)    All other components within normal limits  BASIC METABOLIC PANEL - Abnormal; Notable for the following components:   Sodium 133 (*)  Glucose, Bld 372 (*)    BUN 28 (*)    Creatinine, Ser 1.55 (*)    Calcium 8.1 (*)    GFR, Estimated 46 (*)    All other components within normal limits  LACTIC ACID, PLASMA  LACTIC ACID, PLASMA  LIPASE, BLOOD  PREGNANCY, URINE     I ordered and reviewed the above labs they are notable for initially she is hyperkalemic 6.1 and hyperglycemic in the 500s without an anion gap, and her creatinine is slightly elevated from prior 1.6.  EKG  ED ECG REPORT I, Pilar Jarvis, the attending physician, personally viewed and interpreted this ECG.   Date: 10/01/2022  EKG Time: 2027  Rate: 76  Rhythm: sinus  Axis: nl  Intervals:none  ST&T Change: no stemi    RADIOLOGY I independently reviewed and interpreted CT scan of the abdomen pelvis see no obvious fluid collections   PROCEDURES:  Critical Care performed: No  Procedures   MEDICATIONS ORDERED IN ED: Medications  sodium chloride 0.9 % bolus 1,000 mL (0 mLs Intravenous Stopped 10/01/22 2226)  iohexol (OMNIPAQUE) 300 MG/ML solution 75 mL (75 mLs Intravenous Contrast  Given 10/01/22 2037)  insulin aspart (novoLOG) injection 5 Units (5 Units Intravenous Given 10/01/22 2143)  doxycycline (VIBRA-TABS) tablet 100 mg (100 mg Oral Given 10/01/22 2317)    IMPRESSION / MDM / ASSESSMENT AND PLAN / ED COURSE  I reviewed the triage vital signs and the nursing notes.                                Patient's presentation is most consistent with acute presentation with potential threat to life or bodily function.  Differential diagnosis includes, but is not limited to, cellulitis, abscess, intra-abdominal infection, DKA   The patient is on the cardiac monitor to evaluate for evidence of arrhythmia and/or significant heart rate changes.  MDM: This patient with cellulitic changes around her G-tube site and hyperglycemia without DKA.  The scan shows no fluid collections or abscess.  No abscess on my clinical exam.  She does not appear septic.  She did have some hyperkalemia without EKG changes and a mild AKI on her first lab testing and hyperglycemia without DKA.  I gave her some insulin and fluids and recheck the BMP which is completely normalized creatinine improving and K is normal.  I put her on doxycycline for cellulitis.  She will follow-up with Dr. Everlene Farrier for wound reevaluation and return to the emergency department any new or worsening symptoms.        FINAL CLINICAL IMPRESSION(S) / ED DIAGNOSES   Final diagnoses:  Cellulitis of abdominal wall  Hyperglycemia     Rx / DC Orders   ED Discharge Orders          Ordered    doxycycline (VIBRAMYCIN) 50 MG capsule  2 times daily        10/01/22 2325             Note:  This document was prepared using Dragon voice recognition software and may include unintentional dictation errors.    Pilar Jarvis, MD 10/01/22 [phone]

## 2022-10-01 NOTE — Discharge Instructions (Addendum)
Take doxycycline antibiotic for the full course as prescribed.  Call Dr. Everlene Farrier -your surgeon who placed the G-tube -to check your wound within this next week.  Please keep your wound clean. If you see any signs of infection like spreading redness, pus coming from the wound, extreme pain, fevers, chills or any other worsening doctor right away or come back to the emergency department  It is very important that you take your insulin to keep your blood pressure under control.  Call your diabetes doctor next week to check on your medications for blood sugar management.

## 2022-10-01 NOTE — ED Notes (Signed)
Patient transported to CT 

## 2022-10-04 DIAGNOSIS — E440 Moderate protein-calorie malnutrition: Secondary | ICD-10-CM | POA: Diagnosis not present

## 2022-10-04 DIAGNOSIS — N1832 Chronic kidney disease, stage 3b: Secondary | ICD-10-CM | POA: Diagnosis not present

## 2022-10-04 DIAGNOSIS — Z931 Gastrostomy status: Secondary | ICD-10-CM | POA: Diagnosis not present

## 2022-10-04 DIAGNOSIS — K3184 Gastroparesis: Secondary | ICD-10-CM | POA: Diagnosis not present

## 2022-10-04 DIAGNOSIS — E109 Type 1 diabetes mellitus without complications: Secondary | ICD-10-CM | POA: Diagnosis not present

## 2022-10-22 ENCOUNTER — Ambulatory Visit: Payer: Medicaid Other

## 2022-10-23 DIAGNOSIS — K3184 Gastroparesis: Secondary | ICD-10-CM | POA: Diagnosis not present

## 2022-10-23 DIAGNOSIS — Z931 Gastrostomy status: Secondary | ICD-10-CM | POA: Diagnosis not present

## 2022-10-23 DIAGNOSIS — N1832 Chronic kidney disease, stage 3b: Secondary | ICD-10-CM | POA: Diagnosis not present

## 2022-10-23 DIAGNOSIS — Z419 Encounter for procedure for purposes other than remedying health state, unspecified: Secondary | ICD-10-CM | POA: Diagnosis not present

## 2022-10-23 DIAGNOSIS — E109 Type 1 diabetes mellitus without complications: Secondary | ICD-10-CM | POA: Diagnosis not present

## 2022-10-23 DIAGNOSIS — E440 Moderate protein-calorie malnutrition: Secondary | ICD-10-CM | POA: Diagnosis not present

## 2022-10-27 ENCOUNTER — Ambulatory Visit: Payer: Medicaid Other | Admitting: Surgery

## 2022-11-01 ENCOUNTER — Other Ambulatory Visit: Payer: Self-pay

## 2022-11-01 ENCOUNTER — Inpatient Hospital Stay
Admission: EM | Admit: 2022-11-01 | Discharge: 2022-11-15 | DRG: 640 | Disposition: A | Discharge status: Home / Self Care | Payer: Medicaid Other | Attending: Student in an Organized Health Care Education/Training Program | Admitting: Student in an Organized Health Care Education/Training Program

## 2022-11-01 ENCOUNTER — Encounter: Payer: Self-pay | Admitting: Surgery

## 2022-11-01 ENCOUNTER — Emergency Department: Payer: Medicaid Other

## 2022-11-01 ENCOUNTER — Ambulatory Visit (INDEPENDENT_AMBULATORY_CARE_PROVIDER_SITE_OTHER): Payer: Medicaid Other | Admitting: Surgery

## 2022-11-01 VITALS — BP 90/62 | HR 111 | Temp 97.7°F | Ht 61.0 in | Wt 89.0 lb

## 2022-11-01 DIAGNOSIS — Z79890 Hormone replacement therapy (postmenopausal): Secondary | ICD-10-CM | POA: Diagnosis not present

## 2022-11-01 DIAGNOSIS — E109 Type 1 diabetes mellitus without complications: Secondary | ICD-10-CM | POA: Diagnosis present

## 2022-11-01 DIAGNOSIS — N179 Acute kidney failure, unspecified: Secondary | ICD-10-CM | POA: Diagnosis not present

## 2022-11-01 DIAGNOSIS — E860 Dehydration: Secondary | ICD-10-CM

## 2022-11-01 DIAGNOSIS — R739 Hyperglycemia, unspecified: Secondary | ICD-10-CM | POA: Diagnosis not present

## 2022-11-01 DIAGNOSIS — Z681 Body mass index (BMI) 19 or less, adult: Secondary | ICD-10-CM | POA: Diagnosis not present

## 2022-11-01 DIAGNOSIS — Z9151 Personal history of suicidal behavior: Secondary | ICD-10-CM

## 2022-11-01 DIAGNOSIS — N1831 Chronic kidney disease, stage 3a: Secondary | ICD-10-CM | POA: Diagnosis not present

## 2022-11-01 DIAGNOSIS — R1110 Vomiting, unspecified: Secondary | ICD-10-CM | POA: Diagnosis not present

## 2022-11-01 DIAGNOSIS — Z833 Family history of diabetes mellitus: Secondary | ICD-10-CM

## 2022-11-01 DIAGNOSIS — N183 Chronic kidney disease, stage 3 unspecified: Secondary | ICD-10-CM | POA: Diagnosis present

## 2022-11-01 DIAGNOSIS — R112 Nausea with vomiting, unspecified: Principal | ICD-10-CM

## 2022-11-01 DIAGNOSIS — E039 Hypothyroidism, unspecified: Secondary | ICD-10-CM | POA: Diagnosis present

## 2022-11-01 DIAGNOSIS — F32A Depression, unspecified: Secondary | ICD-10-CM | POA: Diagnosis not present

## 2022-11-01 DIAGNOSIS — Z934 Other artificial openings of gastrointestinal tract status: Secondary | ICD-10-CM | POA: Diagnosis not present

## 2022-11-01 DIAGNOSIS — E1022 Type 1 diabetes mellitus with diabetic chronic kidney disease: Secondary | ICD-10-CM | POA: Diagnosis present

## 2022-11-01 DIAGNOSIS — N1832 Chronic kidney disease, stage 3b: Secondary | ICD-10-CM | POA: Diagnosis present

## 2022-11-01 DIAGNOSIS — E11319 Type 2 diabetes mellitus with unspecified diabetic retinopathy without macular edema: Secondary | ICD-10-CM | POA: Diagnosis present

## 2022-11-01 DIAGNOSIS — D649 Anemia, unspecified: Secondary | ICD-10-CM

## 2022-11-01 DIAGNOSIS — R627 Adult failure to thrive: Secondary | ICD-10-CM

## 2022-11-01 DIAGNOSIS — R109 Unspecified abdominal pain: Secondary | ICD-10-CM | POA: Insufficient documentation

## 2022-11-01 DIAGNOSIS — N3090 Cystitis, unspecified without hematuria: Secondary | ICD-10-CM | POA: Insufficient documentation

## 2022-11-01 DIAGNOSIS — E43 Unspecified severe protein-calorie malnutrition: Secondary | ICD-10-CM | POA: Diagnosis present

## 2022-11-01 DIAGNOSIS — E2740 Unspecified adrenocortical insufficiency: Secondary | ICD-10-CM | POA: Diagnosis present

## 2022-11-01 DIAGNOSIS — E1165 Type 2 diabetes mellitus with hyperglycemia: Secondary | ICD-10-CM | POA: Diagnosis not present

## 2022-11-01 DIAGNOSIS — Z86718 Personal history of other venous thrombosis and embolism: Secondary | ICD-10-CM

## 2022-11-01 DIAGNOSIS — E1065 Type 1 diabetes mellitus with hyperglycemia: Secondary | ICD-10-CM | POA: Diagnosis present

## 2022-11-01 DIAGNOSIS — Z5982 Transportation insecurity: Secondary | ICD-10-CM

## 2022-11-01 DIAGNOSIS — E1043 Type 1 diabetes mellitus with diabetic autonomic (poly)neuropathy: Secondary | ICD-10-CM | POA: Diagnosis not present

## 2022-11-01 DIAGNOSIS — F411 Generalized anxiety disorder: Secondary | ICD-10-CM | POA: Diagnosis present

## 2022-11-01 DIAGNOSIS — K3184 Gastroparesis: Secondary | ICD-10-CM | POA: Diagnosis present

## 2022-11-01 DIAGNOSIS — Z8249 Family history of ischemic heart disease and other diseases of the circulatory system: Secondary | ICD-10-CM

## 2022-11-01 DIAGNOSIS — R7989 Other specified abnormal findings of blood chemistry: Secondary | ICD-10-CM | POA: Insufficient documentation

## 2022-11-01 DIAGNOSIS — I10 Essential (primary) hypertension: Secondary | ICD-10-CM | POA: Diagnosis not present

## 2022-11-01 DIAGNOSIS — E875 Hyperkalemia: Principal | ICD-10-CM | POA: Diagnosis present

## 2022-11-01 DIAGNOSIS — E10649 Type 1 diabetes mellitus with hypoglycemia without coma: Secondary | ICD-10-CM | POA: Diagnosis not present

## 2022-11-01 DIAGNOSIS — Z818 Family history of other mental and behavioral disorders: Secondary | ICD-10-CM

## 2022-11-01 DIAGNOSIS — F332 Major depressive disorder, recurrent severe without psychotic features: Secondary | ICD-10-CM

## 2022-11-01 DIAGNOSIS — Z91141 Patient's other noncompliance with medication regimen due to financial hardship: Secondary | ICD-10-CM

## 2022-11-01 DIAGNOSIS — E1040 Type 1 diabetes mellitus with diabetic neuropathy, unspecified: Secondary | ICD-10-CM | POA: Diagnosis not present

## 2022-11-01 DIAGNOSIS — G8929 Other chronic pain: Secondary | ICD-10-CM | POA: Diagnosis present

## 2022-11-01 DIAGNOSIS — D631 Anemia in chronic kidney disease: Secondary | ICD-10-CM | POA: Diagnosis present

## 2022-11-01 DIAGNOSIS — Z9079 Acquired absence of other genital organ(s): Secondary | ICD-10-CM

## 2022-11-01 DIAGNOSIS — E10319 Type 1 diabetes mellitus with unspecified diabetic retinopathy without macular edema: Secondary | ICD-10-CM | POA: Diagnosis present

## 2022-11-01 DIAGNOSIS — F419 Anxiety disorder, unspecified: Secondary | ICD-10-CM | POA: Diagnosis present

## 2022-11-01 DIAGNOSIS — E1142 Type 2 diabetes mellitus with diabetic polyneuropathy: Secondary | ICD-10-CM | POA: Diagnosis present

## 2022-11-01 LAB — BLOOD GAS, VENOUS
Bicarbonate: 23.4 mmol/L (ref 20.0–28.0)
Patient temperature: 37
pCO2, Ven: 51 mmHg (ref 44–60)
pH, Ven: 7.27 (ref 7.25–7.43)
pO2, Ven: <31 mmHg — CL (ref 32–45)

## 2022-11-01 LAB — HCG, QUANTITATIVE, PREGNANCY: hCG, Beta Chain, Quant, S: 2 m[IU]/mL (ref <5)

## 2022-11-01 LAB — COMPREHENSIVE METABOLIC PANEL
ALT: 8 U/L (ref 0–44)
AST: 18 U/L (ref 15–41)
Albumin: 4.3 g/dL (ref 3.5–5.0)
Alkaline Phosphatase: 64 U/L (ref 38–126)
Anion gap: 10 (ref 5–15)
BUN: 22 mg/dL — ABNORMAL HIGH (ref 6–20)
CO2: 21 mmol/L — ABNORMAL LOW (ref 22–32)
Calcium: 8.9 mg/dL (ref 8.9–10.3)
Chloride: 102 mmol/L (ref 98–111)
Creatinine, Ser: 1.71 mg/dL — ABNORMAL HIGH (ref 0.44–1.00)
GFR, Estimated: 41 mL/min — ABNORMAL LOW (ref >60)
Glucose, Bld: 456 mg/dL — ABNORMAL HIGH (ref 70–99)
Potassium: 5.9 mmol/L — ABNORMAL HIGH (ref 3.5–5.1)
Sodium: 133 mmol/L — ABNORMAL LOW (ref 135–145)
Total Bilirubin: 0.9 mg/dL (ref 0.3–1.2)
Total Protein: 7.1 g/dL (ref 6.5–8.1)

## 2022-11-01 LAB — CBC
HCT: 26.5 % — ABNORMAL LOW (ref 36.0–46.0)
Hemoglobin: 8.8 g/dL — ABNORMAL LOW (ref 12.0–15.0)
MCH: 29.3 pg (ref 26.0–34.0)
MCHC: 33.2 g/dL (ref 30.0–36.0)
MCV: 88.3 fL (ref 80.0–100.0)
Platelets: 152 10*3/uL (ref 150–400)
RBC: 3 MIL/uL — ABNORMAL LOW (ref 3.87–5.11)
RDW: 11.8 % (ref 11.5–15.5)
WBC: 4.2 10*3/uL (ref 4.0–10.5)
nRBC: 0 % (ref 0.0–0.2)

## 2022-11-01 LAB — MAGNESIUM
Magnesium: 2.2 mg/dL (ref 1.7–2.4)
Magnesium: 1.9 mg/dL (ref 1.7–2.4)

## 2022-11-01 LAB — PHOSPHORUS: Phosphorus: 2.4 mg/dL — ABNORMAL LOW (ref 2.5–4.6)

## 2022-11-01 LAB — GLUCOSE, CAPILLARY: Glucose-Capillary: 180 mg/dL — ABNORMAL HIGH (ref 70–99)

## 2022-11-01 MED ORDER — MORPHINE SULFATE (PF) 2 MG/ML IV SOLN
2.0000 mg | INTRAVENOUS | Status: DC | PRN
  Administered 2022-11-02: 2 mg via INTRAVENOUS
  Filled 2022-11-01: qty 1

## 2022-11-01 MED ORDER — HYDROCORTISONE 10 MG PO TABS
10.0000 mg | ORAL_TABLET | Freq: Every day | ORAL | Status: DC
  Administered 2022-11-01 – 2022-11-14 (×14): 10 mg via ORAL
  Filled 2022-11-01 (×14): qty 1

## 2022-11-01 MED ORDER — SENNOSIDES-DOCUSATE SODIUM 8.6-50 MG PO TABS: 1.0000 | ORAL_TABLET | Freq: Every evening | ORAL | Status: DC | PRN

## 2022-11-01 MED ORDER — ACETAMINOPHEN 325 MG PO TABS
650.0000 mg | ORAL_TABLET | Freq: Four times a day (QID) | ORAL | Status: AC | PRN
  Administered 2022-11-05: 650 mg via ORAL
  Filled 2022-11-01: qty 2

## 2022-11-01 MED ORDER — SODIUM CHLORIDE 0.9 % IV SOLN
1.0000 g | Freq: Once | INTRAVENOUS | Status: AC
  Administered 2022-11-01: 1 g via INTRAVENOUS
  Filled 2022-11-01: qty 10

## 2022-11-01 MED ORDER — INSULIN ASPART 100 UNIT/ML IJ SOLN
0.0000 [IU] | Freq: Three times a day (TID) | INTRAMUSCULAR | Status: DC
  Administered 2022-11-02: 3 [IU] via SUBCUTANEOUS
  Administered 2022-11-03: 5 [IU] via SUBCUTANEOUS
  Administered 2022-11-03: 2 [IU] via SUBCUTANEOUS
  Administered 2022-11-03: 9 [IU] via SUBCUTANEOUS
  Administered 2022-11-04: 7 [IU] via SUBCUTANEOUS
  Administered 2022-11-04: 5 [IU] via SUBCUTANEOUS
  Administered 2022-11-04: 1 [IU] via SUBCUTANEOUS
  Administered 2022-11-05 (×2): 2 [IU] via SUBCUTANEOUS
  Filled 2022-11-01 (×9): qty 1

## 2022-11-01 MED ORDER — HYDROCORTISONE 10 MG PO TABS: 10.0000 mg | ORAL_TABLET | ORAL | Status: DC

## 2022-11-01 MED ORDER — LORAZEPAM 2 MG/ML IJ SOLN: 0.5000 mg | Freq: Four times a day (QID) | INTRAMUSCULAR | Status: DC | PRN

## 2022-11-01 MED ORDER — IOHEXOL 300 MG/ML  SOLN
75.0000 mL | Freq: Once | INTRAMUSCULAR | Status: AC | PRN
  Administered 2022-11-01: 75 mL via INTRAVENOUS

## 2022-11-01 MED ORDER — HYDRALAZINE HCL 20 MG/ML IJ SOLN: 5.0000 mg | Freq: Three times a day (TID) | INTRAMUSCULAR | Status: AC | PRN

## 2022-11-01 MED ORDER — MORPHINE SULFATE (PF) 4 MG/ML IV SOLN
4.0000 mg | Freq: Once | INTRAVENOUS | Status: AC
  Administered 2022-11-01: 4 mg via INTRAVENOUS
  Filled 2022-11-01: qty 1

## 2022-11-01 MED ORDER — INSULIN ASPART 100 UNIT/ML IJ SOLN
5.0000 [IU] | Freq: Once | INTRAMUSCULAR | Status: AC
  Administered 2022-11-01: 5 [IU] via INTRAVENOUS
  Filled 2022-11-01: qty 1

## 2022-11-01 MED ORDER — DIVALPROEX SODIUM ER 250 MG PO TB24
250.0000 mg | ORAL_TABLET | Freq: Every day | ORAL | Status: DC
  Administered 2022-11-01 – 2022-11-15 (×15): 250 mg via ORAL
  Filled 2022-11-01 (×15): qty 1

## 2022-11-01 MED ORDER — INSULIN ASPART 100 UNIT/ML IJ SOLN
0.0000 [IU] | Freq: Every day | INTRAMUSCULAR | Status: DC
  Administered 2022-11-03 – 2022-11-04 (×2): 3 [IU] via SUBCUTANEOUS
  Filled 2022-11-01 (×2): qty 1

## 2022-11-01 MED ORDER — SODIUM CHLORIDE 0.9 % IV SOLN: INTRAVENOUS | Status: AC

## 2022-11-01 MED ORDER — ONDANSETRON HCL 4 MG PO TABS: 4.0000 mg | ORAL_TABLET | Freq: Four times a day (QID) | ORAL | Status: DC | PRN

## 2022-11-01 MED ORDER — ONDANSETRON HCL 4 MG/2ML IJ SOLN
4.0000 mg | Freq: Four times a day (QID) | INTRAMUSCULAR | Status: DC | PRN
  Administered 2022-11-02: 4 mg via INTRAVENOUS
  Filled 2022-11-01 (×2): qty 2

## 2022-11-01 MED ORDER — SODIUM CHLORIDE 0.9 % IV BOLUS
1000.0000 mL | Freq: Once | INTRAVENOUS | Status: AC
  Administered 2022-11-01: 1000 mL via INTRAVENOUS

## 2022-11-01 MED ORDER — FENTANYL CITRATE PF 50 MCG/ML IJ SOSY: 12.5000 ug | PREFILLED_SYRINGE | INTRAMUSCULAR | Status: AC | PRN

## 2022-11-01 MED ORDER — HEPARIN SODIUM (PORCINE) 5000 UNIT/ML IJ SOLN
5000.0000 [IU] | Freq: Three times a day (TID) | INTRAMUSCULAR | Status: DC
  Filled 2022-11-01: qty 1

## 2022-11-01 MED ORDER — INSULIN GLARGINE-YFGN 100 UNIT/ML ~~LOC~~ SOLN
4.0000 [IU] | Freq: Two times a day (BID) | SUBCUTANEOUS | Status: DC
  Administered 2022-11-01 – 2022-11-02 (×2): 4 [IU] via SUBCUTANEOUS
  Filled 2022-11-01 (×3): qty 0.04

## 2022-11-01 MED ORDER — ACETAMINOPHEN 650 MG RE SUPP: 650.0000 mg | Freq: Four times a day (QID) | RECTAL | Status: AC | PRN

## 2022-11-01 MED ORDER — SODIUM CHLORIDE 0.9 % IV SOLN
12.5000 mg | Freq: Four times a day (QID) | INTRAVENOUS | Status: AC | PRN
  Administered 2022-11-02 (×2): 12.5 mg via INTRAVENOUS
  Filled 2022-11-01 (×2): qty 12.5

## 2022-11-01 MED ORDER — LEVOTHYROXINE SODIUM 50 MCG PO TABS
50.0000 ug | ORAL_TABLET | Freq: Every day | ORAL | Status: DC
  Administered 2022-11-02 – 2022-11-15 (×13): 50 ug via ORAL
  Filled 2022-11-01 (×14): qty 1

## 2022-11-01 MED ORDER — FERROUS SULFATE 325 (65 FE) MG PO TABS
325.0000 mg | ORAL_TABLET | Freq: Every day | ORAL | Status: DC
  Administered 2022-11-02 – 2022-11-15 (×14): 325 mg via ORAL
  Filled 2022-11-01 (×14): qty 1

## 2022-11-01 MED ORDER — HYDROCORTISONE 10 MG PO TABS
20.0000 mg | ORAL_TABLET | Freq: Every day | ORAL | Status: DC
  Administered 2022-11-02 – 2022-11-15 (×14): 20 mg via ORAL
  Filled 2022-11-01 (×14): qty 2

## 2022-11-01 NOTE — ED Provider Notes (Signed)
[facility] Provider Note    Event Date/Time   First MD Initiated Contact with Patient 11/01/22 1621     (approximate)   History   Chief Complaint Failure To Thrive   HPI  Ashley Camacho is a 32 y.o. female with past medical history of diabetes, gastroparesis status post jejunostomy, CKD, hypertension, adrenal insufficiency, and hypothyroidism who presents to the ED for failure to thrive.  Patient reports that for about the past week she has been unable to tolerate much oral intake.  She does not feel nauseous until she tries to eat something, after which she quickly vomits it back up.  She states she is able to tolerate small sips of water, but not much else.  If she takes larger sips of water or other liquids, they also are vomited back up.  She states she will typically use her jejunostomy tube in this situation, but it has been increasingly painful to her for about the past week.  She reports a couple of weeks ago she was seen and diagnosed with infection at the site, pain seemed to improve following a course of antibiotics but came back this past week.  She reports a reddened swollen area at the site, has not noticed any drainage from the area.  Pain radiates up towards the left side of her ribs as well as across the right side of her abdomen.  She denies any fevers or dysuria.  She was initially seen by her surgeon today, who referred her to the ED for admission.     Physical Exam   Triage Vital Signs: ED Triage Vitals [11/01/22 1527]  Enc Vitals Group     BP 96/67     Pulse Rate 95     Resp 16     Temp 98 F (36.7 C)     Temp Source Oral     SpO2 100 %     Weight 88 lb 2.9 oz (40 kg)     Height 5\' 1"  (1.549 m)     Head Circumference      Peak Flow      Pain Score 0     Pain Loc      Pain Edu?      Excl. in GC?     Most recent vital signs: Vitals:   11/01/22 1527  BP: 96/67  Pulse: 95  Resp: 16  Temp: 98 F (36.7 C)  SpO2: 100%     Constitutional: Alert and oriented.  Very thin appearing. Eyes: Conjunctivae are normal. Head: Atraumatic. Nose: No congestion/rhinnorhea. Mouth/Throat: Mucous membranes are dry. Cardiovascular: Normal rate, regular rhythm. Grossly normal heart sounds.  2+ radial pulses bilaterally. Respiratory: Normal respiratory effort.  No retractions. Lungs CTAB. Gastrointestinal: Soft and diffusely tender to palpation, greatest at the jejunostomy site.  She has a small exposed area of mucosal appearing tissue, no associated erythema, warmth, or purulent drainage noted. No distention. Musculoskeletal: No lower extremity tenderness nor edema.  Neurologic:  Normal speech and language. No gross focal neurologic deficits are appreciated.    ED Results / Procedures / Treatments   Labs (all labs ordered are listed, but only abnormal results are displayed) Labs Reviewed  CBC - Abnormal; Notable for the following components:      Result Value   RBC 3.00 (*)    Hemoglobin 8.8 (*)    HCT 26.5 (*)    All other components within normal limits  COMPREHENSIVE METABOLIC PANEL - Abnormal; Notable for  the following components:   Sodium 133 (*)    Potassium 5.9 (*)    CO2 21 (*)    Glucose, Bld 456 (*)    BUN 22 (*)    Creatinine, Ser 1.71 (*)    GFR, Estimated 41 (*)    All other components within normal limits  BLOOD GAS, VENOUS - Abnormal; Notable for the following components:   pO2, Ven <31 (*)    Acid-base deficit 4.0 (*)    All other components within normal limits  MAGNESIUM  HCG, QUANTITATIVE, PREGNANCY  URINALYSIS, COMPLETE (UACMP) WITH MICROSCOPIC  POC URINE PREG, ED     EKG  ED ECG REPORT I, Chesley Noon, the attending physician, personally viewed and interpreted this ECG.   Date: 11/01/2022  EKG Time: 17:31  Rate: 72  Rhythm: normal sinus rhythm  Axis: Normal  Intervals:none  ST&T Change: None  RADIOLOGY CT abdomen/pelvis reviewed and interpreted by me with no  inflammatory changes, focal fluid collections, or dilated bowel loops.  PROCEDURES:  Critical Care performed: No  Procedures   MEDICATIONS ORDERED IN ED: Medications  sodium chloride 0.9 % bolus 1,000 mL (1,000 mLs Intravenous New Bag/Given 11/01/22 1733)  morphine (PF) 4 MG/ML injection 4 mg (4 mg Intravenous Given 11/01/22 1731)  insulin aspart (novoLOG) injection 5 Units (5 Units Intravenous Given 11/01/22 1732)  iohexol (OMNIPAQUE) 300 MG/ML solution 75 mL (75 mLs Intravenous Contrast Given 11/01/22 1830)     IMPRESSION / MDM / ASSESSMENT AND PLAN / ED COURSE  I reviewed the triage vital signs and the nursing notes.                              32 y.o. female with past medical history of diabetes, gastroparesis status post jejunostomy tube, CKD, hypertension, adrenal insufficiency, and hypothyroidism who presents to the ED with increasing nausea, vomiting, and abdominal pain over the past week with inability to tolerate oral intake or use her J-tube.  Patient's presentation is most consistent with acute presentation with potential threat to life or bodily function.  Differential diagnosis includes, but is not limited to, bowel obstruction, jejunostomy site infection, gastroparesis, dehydration, electrode abnormality, AKI, DKA, hyperglycemia.  Patient chronically ill-appearing but in no acute distress, vital signs are unremarkable.  Her abdomen is soft but diffusely tender to palpation, particularly around her jejunostomy site.  There appears to be some exposed mucosa at the site but no signs of soft tissue infection.  We will further assess with CT imaging for infection or malposition of J-tube.  She does appear quite dehydrated, labs show significant hyperglycemia but no evidence of DKA.  She does have a mild AKI with mild hyperkalemia, will hydrate with IV fluids and give IV insulin.  LFTs and lipase are unremarkable, urinalysis pending at this time.  Patient with anemia stable  compared to previous, no significant leukocytosis.  CT imaging shows appropriate positioning of J-tube with no associated infection.  There are possible signs of cystitis, but patient has been unable to provide urine sample thus far.  She was given IV insulin for hyperglycemia and case discussed with hospitalist for admission.      FINAL CLINICAL IMPRESSION(S) / ED DIAGNOSES   Final diagnoses:  Intractable nausea and vomiting  Hyperglycemia  Failure to thrive in adult     Rx / DC Orders   ED Discharge Orders     None        Note:  This document was prepared using Dragon voice recognition software and may include unintentional dictation errors.   Chesley Noon, MD 11/01/22 [phone]

## 2022-11-01 NOTE — Assessment & Plan Note (Signed)
-   Registered dietitian has been consulted 

## 2022-11-01 NOTE — H&P (Signed)
History and Physical   Ashley Camacho ZOX:[id number] DOB: 08/28/1990 DOA: 11/01/2022  PCP: Pcp, No Outpatient Specialists: Dr. Everlene Farrier, general surgery Patient coming from: Home via POV  I have personally briefly reviewed patient's old medical records in [facility] EMR.  Chief Concern: Severe malnutrition, abdominal pain  HPI: Ashley Camacho is a 32 year old female with insulin-dependent diabetes mellitus type 1, severe gastroparesis, severe malnutrition, status post jejunostomy tube feed, adrenal insufficiency, CKD stage IIIb, hypertension, hypothyroid, who presents to the emergency department at the advice of general surgeon for left-sided abdominal pain, nausea, vomiting and dehydration.  Per general surgeon, patient tries to use the jejunostomy tube but it hurts.  Vitals in the ED showed temperature of 98, respiration rate of 16, heart rate of 95, blood pressure 96/67, SpO2 of 100% on room air.  hCG quantitative pregnancy level was 2.  Serum sodium is 133, potassium 5.9, chloride 102, bicarb 21, BUN of 22, serum creatinine of 1.71, EGFR 41, nonfasting blood glucose 156, WBC 4.2, hemoglobin 8.8, platelets of 152.  Anion gap was not elevated at 10.  VBG was 7.27/51/less than 31. UA has been ordered and pending collection.  ED treatment: Insulin 5 units, morphine 4 mg IV one-time dose, sodium chloride 1 L bolus. ------------------------------ At bedside, patient was able to tell me her name, age, location, current calendar year.  She said that she had jejunostomy tube placed in January and has been using appropriately until about 1 month ago when the tube got infected.  She states she was placed on antibiotic and has been taking antibiotics as appropriate.  She was able to resume the tube feed as appropriate until 1 week ago.  She reports that over the last week, her tube started hurting and she has not been using her tube feeds over the last week.  She endorses persistent nausea  and vomiting, and poor p.o. intake.  She has not been compliant with all of her medications.  She denies chest pain, shortness of breath, fever, chills, dysuria, hematuria, diarrhea, swelling of her lower extremities.  Social history: She lives at home with her 2 children and the father of her children.  She states she is not legally married.  She denies tobacco, EtOH, recreational drug use.  ROS: Constitutional: no weight change, no fever ENT/Mouth: no sore throat, no rhinorrhea Eyes: no eye pain, no vision changes Cardiovascular: no chest pain, no dyspnea,  no edema, no palpitations Respiratory: no cough, no sputum, no wheezing Gastrointestinal: + nausea, + vomiting, no diarrhea, no constipation Genitourinary: no urinary incontinence, no dysuria, no hematuria Musculoskeletal: no arthralgias, no myalgias Skin: no skin lesions, no pruritus, Neuro: + weakness, no loss of consciousness, no syncope Psych: no anxiety, no depression, + decrease appetite Heme/Lymph: no bruising, no bleeding  ED Course: Discussed with emergency medicine provider, patient requiring hospitalization for chief concerns of hyperkalemia and severe malnutrition.  Assessment/Plan  Principal Problem:   Hyperkalemia Active Problems:   Gastroparesis   Adrenal insufficiency (HCC)   Hypothyroidism   Anxiety   Brittle diabetes (HCC)   Diabetic sensorimotor neuropathy (HCC)   Diabetic retinopathy (HCC)   CKD stage 3 due to type 1 diabetes mellitus (HCC)   Essential hypertension   Adrenal cortical hypofunction (HCC)   Protein-calorie malnutrition, severe (HCC)   CKD stage 3a, GFR 45-59 ml/min (HCC)   Elevated serum creatinine   Abdominal pain   Cystitis   Assessment and Plan:  * Hyperkalemia Status post insulin 5 units one-time dose,  sodium chloride 1 L bolus We will continue with sodium chloride infusion at 150 mL/h Admit to telemetry medical, inpatient  Adrenal insufficiency (HCC) Patient has not taken  her Cortef over the last 2 days due to nausea Resumed home Cortef 20 mg in the morning and 10 mg at with supper  Hypothyroidism Levothyroxine 50 mcg daily before breakfast resumed  Cystitis On CT UA has been ordered and pending collection Ceftriaxone 1 g IV one-time dose ordered, a.m. team to order antibiotic pending UA  Abdominal pain General surgery will follow regarding jejunostomy tube Symptomatic support: Morphine 2 mg IV every 4 hours as needed for moderate pain, 18 hours ordered; fentanyl 12.5 mcg IV every 4 hours as needed for severe pain, 18 hours ordered  Elevated serum creatinine Status post sodium chloride bolus 1 L per EDP Continue with sodium chloride infusion at 150 mL/h, 1 day ordered Strict I's and O's  Protein-calorie malnutrition, severe (HCC) Registered dietitian has been consulted  Essential hypertension Hydralazine 5 mg IV every 8 hours as needed for SBP greater 175, 4 days ordered  CKD stage 3 due to type 1 diabetes mellitus (HCC) Insulin long-acting, 4 units subcutaneous twice daily resumed Insulin SSI with at bedtime coverage ordered Goal inpatient blood glucose levels 140-180  Anxiety Patient states she did not have a refill for her antidepression/anxiety medication Ativan 0.5 mg IV every 6 hours as needed for anxiety, 1 day ordered  Chart reviewed.   DVT prophylaxis: Heparin 5000 units every 8 hours Code Status: Full code Diet: N.p.o. as patient has severe gastroparesis status post jejunostomy tube Family Communication: a phone call was offered, patient declined stating her mother already knows she is in the hospital  Disposition Plan: Pending clinical course Consults called: General surgery via EDP Admission status: Telemetry medical, inpatient  Past Medical History:  Diagnosis Date   Acute metabolic encephalopathy 06/10/2022   Adrenal insufficiency (HCC)    Anemia    Gastroparesis    Hypertension    Type 1 diabetes (HCC)    Past  Surgical History:  Procedure Laterality Date   BIOPSY  01/14/2021   Procedure: BIOPSY;  Surgeon: Tressia Danas, MD;  Location: [facility] ENDOSCOPY;  Service: Gastroenterology;;   CESAREAN SECTION     CESAREAN SECTION WITH BILATERAL TUBAL LIGATION N/A 02/17/2021   Procedure: CESAREAN SECTION WITH BILATERAL TUBAL LIGATION;  Surgeon: [name], DO;  Location: [facility] LD ORS;  Service: Obstetrics;  Laterality: N/A;   ESOPHAGOGASTRODUODENOSCOPY (EGD) WITH PROPOFOL N/A 01/14/2021   Procedure: ESOPHAGOGASTRODUODENOSCOPY (EGD) WITH PROPOFOL;  Surgeon: Tressia Danas, MD;  Location[facility] ENDOSCOPY;  Service: Gastroenterology;  Laterality: N/A;   JEJUNOSTOMY N/A 06/13/2022   Procedure: JEJUNOSTOMY;  Surgeon: Leafy Ro, MD;  Location: [facility] ORS;  Service: General;  Laterality: N/A;   MOUTH SURGERY     Social History:  reports that she has never smoked. She has never been exposed to tobacco smoke. She has never used smokeless tobacco. She reports that she does not drink alcohol and does not use drugs.  No Known Allergies Family History  Problem Relation Age of Onset   Diabetes type I Father    CAD Father    CAD Paternal Grandmother    CAD Paternal Grandfather    Breast cancer Mother    Ovarian cancer Neg Hx    Family history: Family history reviewed and not pertinent.  Prior to Admission medications   Medication Sig Start Date End Date Taking? Authorizing Provider  acetaminophen (TYLENOL) 500 MG  tablet Take 2 tablets (1,000 mg total) by mouth every 6 (six) hours as needed for headache, fever or moderate pain. 08/05/22   Loyce Dys, MD  amLODipine (NORVASC) 5 MG tablet Take 1 tablet (5 mg total) by mouth daily. 08/05/22 11/01/22  Loyce Dys, MD  divalproex (DEPAKOTE ER) 250 MG 24 hr tablet Take 1 tablet (250 mg total) by mouth daily. 08/05/22   Loyce Dys, MD  ferrous sulfate 325 (65 FE) MG tablet Take 1 tablet (325 mg total) by mouth daily with breakfast. 08/05/22 11/01/22  Loyce Dys,  MD  FLUoxetine (PROZAC) 10 MG capsule Take 1 capsule (10 mg total) by mouth daily. 08/05/22 11/01/22  Loyce Dys, MD  hydrocortisone (CORTEF) 10 MG tablet Take 2 tablets (20 mg total) by mouth daily with breakfast AND 1 tablet (10 mg total) daily with supper. 08/05/22   Loyce Dys, MD  insulin aspart (NOVOLOG FLEXPEN) 100 UNIT/ML FlexPen Inject 2 units into the skin 3 times daily with meals. Add 0-9 Units  with meals based on your blood sugar. Inject 0-5 units at bedtime based on your blood sugar. 08/05/22   Loyce Dys, MD  insulin aspart (NOVOLOG) 100 UNIT/ML injection Inject 2 Units into the skin 3 (three) times daily with meals. 08/05/22 01/19/23  Loyce Dys, MD  insulin aspart (NOVOLOG) 100 UNIT/ML injection Inject 0-5 Units into the skin at bedtime. 08/05/22 09/04/22  Loyce Dys, MD  Insulin Glargine (BASAGLAR KWIKPEN) 100 UNIT/ML Inject 4 Units into the skin 2 (two) times daily. 08/05/22 02/09/23  Loyce Dys, MD  levothyroxine (SYNTHROID) 50 MCG tablet Take 1 tablet (50 mcg total) by mouth daily at 6 (six) AM. 08/05/22 09/04/22  Loyce Dys, MD  promethazine (PHENERGAN) 25 MG tablet Take 0.5 tablets (12.5 mg total) by mouth every 6 (six) hours as needed for nausea or vomiting. 08/05/22 09/04/22  Loyce Dys, MD  senna-docusate (SENOKOT-S) 8.6-50 MG tablet Take 1 tablet by mouth at bedtime as needed for mild constipation. 08/05/22   Loyce Dys, MD   Physical Exam: Vitals:   11/01/22 1527 11/01/22 2035  BP: 96/67 113/77  Pulse: 95 68  Resp: 16 16  Temp: 98 F (36.7 C) (!) 97.5 F (36.4 C)  TempSrc: Oral Oral  SpO2: 100% 100%  Weight: 40 kg   Height: 5\' 1"  (1.549 m)    Constitutional: appears older than chronological age, frail, calm Eyes: PERRL, lids and conjunctivae normal ENMT: Mucous membranes are moist. Posterior pharynx clear of any exudate or lesions. Age-appropriate dentition. Hearing appropriate Neck: normal, supple, no masses, no thyromegaly Respiratory:  clear to auscultation bilaterally, no wheezing, no crackles. Normal respiratory effort. No accessory muscle use.  Cardiovascular: Regular rate and rhythm, no murmurs / rubs / gallops. No extremity edema. 2+ pedal pulses. No carotid bruits.  Abdomen: + generalized tenderness, no masses palpated, no hepatosplenomegaly. Bowel sounds positive. Jejunostomy tube in pace Musculoskeletal: no clubbing / cyanosis. No joint deformity upper and lower extremities. Good ROM, no contractures, no atrophy. Normal muscle tone.  Skin: no rashes, lesions, ulcers. No induration Neurologic: Sensation intact. Strength 5/5 in all 4.  Psychiatric: Normal judgment and insight. Alert and oriented x 3. Normal mood.   EKG: independently reviewed, showing sinus rhythm with rate of 72, QTc 422  Chest x-ray on Admission: I personally reviewed and I agree with radiologist reading as below.  CT ABDOMEN PELVIS W CONTRAST  Result Date: 11/01/2022 CLINICAL DATA:  Left-sided abdominal pain. Nausea and vomiting. Dehydration. Jejunostomy tube. 10 lb weight loss in past month. EXAM: CT ABDOMEN AND PELVIS WITH CONTRAST TECHNIQUE: Multidetector CT imaging of the abdomen and pelvis was performed using the standard protocol following bolus administration of intravenous contrast. RADIATION DOSE REDUCTION: This exam was performed according to the departmental dose-optimization program which includes automated exposure control, adjustment of the mA and/or kV according to patient size and/or use of iterative reconstruction technique. CONTRAST:  75mL OMNIPAQUE IOHEXOL 300 MG/ML  SOLN COMPARISON:  10/01/2022 FINDINGS: Lower Chest: No acute findings. Hepatobiliary: No hepatic masses identified. Gallbladder is unremarkable. No evidence of biliary ductal dilatation. Pancreas:  No mass or inflammatory changes. Spleen: Within normal limits in size and appearance. Adrenals/Urinary Tract: No suspicious masses identified. No evidence of ureteral calculi or  hydronephrosis. Stable mild diffuse bladder wall thickening, suspicious for cystitis. Stomach/Bowel: Jejunostomy tube in appropriate position. Evidence of free intraperitoneal air. No evidence of bowel obstruction, inflammatory process or abscess. Vascular/Lymphatic: No pathologically enlarged lymph nodes. No acute vascular findings. Reproductive: Uterus is unremarkable. No adnexal masses identified. Small amount of free fluid in the dependent pelvis shows no significant change since previous study, and is most likely physiologic. Other:  None. Musculoskeletal:  No suspicious bone lesions identified. IMPRESSION: Jejunostomy tube in appropriate position. No evidence of bowel obstruction or other acute findings. Stable mild diffuse bladder wall thickening, suspicious for cystitis. Suggest correlation with urinalysis. Electronically Signed   By: Danae Orleans M.D.   On: 11/01/2022 19:01    Labs on Admission: I have personally reviewed following labs  CBC: Recent Labs  Lab 11/01/22 1532  WBC 4.2  HGB 8.8*  HCT 26.5*  MCV 88.3  PLT 152   Basic Metabolic Panel: Recent Labs  Lab 11/01/22 1532  NA 133*  K 5.9*  CL 102  CO2 21*  GLUCOSE 456*  BUN 22*  CREATININE 1.71*  CALCIUM 8.9  MG 2.2   GFR: Estimated Creatinine Clearance: 30.1 mL/min (A) (by C-G formula based on SCr of 1.71 mg/dL (H)).  Liver Function Tests: Recent Labs  Lab 11/01/22 1532  AST 18  ALT 8  ALKPHOS 64  BILITOT 0.9  PROT 7.1  ALBUMIN 4.3   Urine analysis:    Component Value Date/Time   COLORURINE YELLOW (A) 10/01/2022 1814   APPEARANCEUR CLEAR (A) 10/01/2022 1814   APPEARANCEUR Clear 09/18/2020 1153   LABSPEC 1.015 10/01/2022 1814   LABSPEC 1.026 03/01/2013 1844   PHURINE 5.0 10/01/2022 1814   GLUCOSEU >=500 (A) 10/01/2022 1814   GLUCOSEU >=500 03/01/2013 1844   HGBUR NEGATIVE 10/01/2022 1814   BILIRUBINUR NEGATIVE 10/01/2022 1814   BILIRUBINUR neg 10/23/2020 0953   BILIRUBINUR Negative 09/18/2020 1153    BILIRUBINUR Negative 03/01/2013 1844   KETONESUR NEGATIVE 10/01/2022 1814   PROTEINUR NEGATIVE 10/01/2022 1814   UROBILINOGEN 0.2 10/23/2020 0953   NITRITE NEGATIVE 10/01/2022 1814   LEUKOCYTESUR NEGATIVE 10/01/2022 1814   LEUKOCYTESUR Negative 03/01/2013 1844   This document was prepared using Dragon Voice Recognition software and may include unintentional dictation errors.  Dr. Sedalia Muta [facility]  If 7PM-7AM, please contact overnight-coverage provider If 7AM-7PM, please contact day attending provider [email or web address]  11/01/2022, 8:55 PM

## 2022-11-01 NOTE — Assessment & Plan Note (Signed)
-   Hydralazine 5 mg IV every 8 hours as needed for SBP greater 175, 4 days ordered 

## 2022-11-01 NOTE — Assessment & Plan Note (Signed)
General surgery will follow regarding jejunostomy tube Symptomatic support: Morphine 2 mg IV every 4 hours as needed for moderate pain, 18 hours ordered; fentanyl 12.5 mcg IV every 4 hours as needed for severe pain, 18 hours ordered

## 2022-11-01 NOTE — Hospital Course (Addendum)
[name] is a 32 year old female with insulin-dependent diabetes mellitus type 1, severe gastroparesis, severe malnutrition, status post jejunostomy tube feed, adrenal insufficiency, CKD stage IIIb, hypertension, hypothyroid, who presents to the emergency department at the advice of general surgeon for left-sided abdominal pain, nausea, vomiting and dehydration. Per general surgeon, patient tries to use the jejunostomy tube but it hurts. 06/10: in ED, hyperkalmic in ED. CT no cause for abd pain noted. Admitted to hospitalist service w/ gen surg to consult.  06/11: General surgery to see - ok for slow Jtube feeds (CT scan as well as a Gastrografin contrast via the jejunostomy tube - no extravasation of contrast, no complication or abscess intra-abdominal) and recs for BH eval. Pt unable to take po at this time but still very labile Glc.  06/12: continuing tube feeds for now. Psychiatry - restart Prozac.  06/13: TOC following w/ DSS. Concern for DV, see RN note from this morning. Working on getting her some resources. Worse abdominal pain today, unable to take any po but tube feeds going ok.  06/14-06/15: better po intake. Tube feeds at 50 mL/h, will try increasing  06/16: increased rate caused substantial pain. Trial lower rate today and higher po intake, pt hopeful she might feel ok for discharge tomorrow  06/17: substantial pain w/ po intake or tube feeds higher than 50 mL/h (tried at 100 and 75 overnight) requiring opiate pain control. Bentyl not helpful yesterday. Will trial Reglan and d/c morphine. Check TSH - WNL. [facility], spoke w/ nurse manager who will ask about getting patient reestablished.  06/18: still severe pain. Encouraged ambulation.    Consultants:  General Surgery  Psychiatry   Procedures: none      ASSESSMENT & PLAN:   Active Problems:   Gastroparesis   Adrenal insufficiency (HCC)   Hypothyroidism   Anxiety   Brittle diabetes (HCC)    Diabetic sensorimotor neuropathy (HCC)   Diabetic retinopathy (HCC)   Hyperkalemia   CKD stage 3 due to type 1 diabetes mellitus (HCC)   Essential hypertension   Adrenal cortical hypofunction (HCC)   Protein-calorie malnutrition, severe (HCC)   CKD stage 3a, GFR 45-59 ml/min (HCC)   Elevated serum creatinine   Abdominal pain   Cystitis   Severe recurrent major depression (HCC)    Protein-calorie malnutrition, severe (HCC) Registered dietitian following Tube feeds slowly titrating up but this caused significant abdominal pain 169mL/h x 10 hours = 1000 mL, 1500 kcal, 82.5g protein ~80% of patient's needs. 100 mL/h x 8 hours = 800 mL, 1200 kcal, 66g protein ~70% of pt's needs.  Diet po as able, has taken minimal oral intake  BH eval done- treating anxiety/depression, no mention for eating disorder  Dietician following Unfortunately, experiencing severe abdominal pain w/ any oral or tube intake. No new features that would concern for surgeon to reevaluate. Given her issues w/ tube feeds at home (caring for small child without help, child pulls at tubes/lines, pt does not get predictable sleep and she co-sleeps with the child as well) doubt TPN is worth exploring as this would pose same problems and higher risk.   Type 1 diabetes mellitus (HCC) labile Insulin long-acting Insulin SSI q4h for now given low po intake and tube feeds running  Goal inpatient blood glucose levels 140-180 Very labile Glc, chronic  Has been d/c from [facility] endocrine d/t no-shows, but this was result of transportation issues outside her control, hopefully can get her set up w/ endocrinology again once TOC  ensures transportation plan. Waiting to hear back form the clinic, spoke w/ nurse manager on 06/17  Abdominal pain Gastroparesis, chronic  General surgery consulted - no concerns at this time regarding jejunostomy tube Pain control   Hyperkalemia - resolved Monitor BMP  Elevated serum creatinine/AKI on  CKD3a - improved/resolved  Strict I's and O's  Essential hypertension Hydralazine 5 mg IV every 8 hours as needed for SBP greater 175  GAD, MDD  Patient states she did not have a refill for her antidepression/anxiety medication Ativan 0.5 mg IV every 6 hours as needed for anxiety Psychiatry consulted here to address anxiety/depression and concern for eating disorder - Prozac started   Hypothyroidism, TSH WNL Levothyroxine 50 mcg daily before breakfast resumed  Adrenal insufficiency Patient had not taken her Cortef over the last 2 days PTA due to nausea Resumed home Cortef 20 mg in the morning and 10 mg at with supper  Cystitis / UTI ruled out  Ceftriaxone was given x2 doses pending urine culture --> UCx NG and no symptoms, will d/c abx   Difficult social situation Lives w/ father of her kids but he is not helpful, they are essentially living separately he is upstairs she is downstairs w/ kids. Young child learning to walk, grabs at her feeding tubes, difficult to administer her feeds at hom. Her mom helps some w/ transportation but limited ability to help watch kids. Father of her kids and she do not have a good relationship, she reports he is using her disability funds for himself and has threatened to not let her move back in when she leaves the hospital.     DVT prophylaxis: refusing SCD and lovenox/heparin  Pertinent IV fluids/nutrition: no continuous IV fluids, diabetic diet as tolerated, tube feeds as tolerated.  Central lines / invasive devices: J-tube  Code Status: FULL CODE  ACP documentation reviewed: 11/02/22 none on file   Current Admission Status: inpatient   TOC needs / Dispo plan: TBD, expect home to previous home environment +/- HH  Barriers to discharge / significant pending items: hopefully will be able to d/c home soon

## 2022-11-01 NOTE — Patient Instructions (Signed)
Please go to the Emergency Department to get fluids.  If you have any concerns or questions, please feel free to call our office.   Malnutrition, Adult  Malnutrition is a group of symptoms that affects adults, including the elderly. These symptoms include eating too little and losing weight. People who have this may not want to be with friends, or they may not want to eat or drink. This condition is not a normal part of getting older. What are the causes? A disease, such as dementia, diabetes, cancer, or lung disease. A health problem, such as a vitamin deficiency or a heart problem. A disorder, such as sadness (depression). A disability. Medicines. Having tooth or mouth problems. Neglect or being treated badly. In some cases, the cause may not be known. What are the signs or symptoms? Loss of more than 5% of your body weight. Being more tired than normal after an activity. Having trouble getting up after sitting. Not feeling hungry. Not getting out of bed. Not wanting to do your normal activities. Sadness. Getting infections often. Bedsores. Taking a long time to get better after an infection, injury, or a surgery. Weakness. How is this treated? Treatment for this condition depends on the cause. It may be treated by: Treating a disease or disorder that is causing symptoms. Having talk therapy or taking medicine to treat sadness. Eating better, such as eating more often or taking nutritional supplements. Changing or stopping a medicine. Having physical or occupational therapy. It often takes a team of doctors to find the right treatment. Follow these instructions at home:  Take over-the-counter and prescription medicines only as told by your doctor. Eat a healthy diet. Make sure that you eat enough. Ask your doctor how much you should eat. Be active. Do exercises that make you stronger (strength training). A physical therapist can help to set up a program that fits you. Make  sure that you are safe at home. Have a plan for what to do if you cannot make decisions for yourself. Contact a doctor if: You are not able to eat well. You are not able to move around. You feel very sad. You feel very hopeless. Get help right away if: You think about ending your life. You cannot eat or drink. You do not get out of bed. Staying at home is not safe. You have a fever. Get help right away if you feel like you may hurt yourself or others, or have thoughts about taking your own life. Go to your nearest emergency room or: Call 911. Call the National Suicide Prevention Lifeline at (425) 675-1964 or 988. This is open 24 hours a day. Text the Crisis Text Line at (407)402-9708. Summary Malnutrition is a group of symptoms that affect adults, including the elderly. Symptoms include eating too little and losing weight. Take all medicines only as told by your doctor. Eat a healthy diet. Make sure that you eat enough. Ask your doctor how much you should eat. Be active. Do exercises that make you stronger. A physical therapist can help to set up a program that is right for you. This information is not intended to replace advice given to you by your health care provider. Make sure you discuss any questions you have with your health care provider. Document Revised: 01/23/2021 Document Reviewed: 01/23/2021 Elsevier Patient Education  2024 [employer]   PEG Tube Home Guide A PEG tube is used to put food, medicine, and fluids into the stomach. Before you leave the hospital, make  sure that you know: How to care for your PEG tube. How to care for the opening (stoma) in your belly. How to give yourself a feeding. How to give yourself medicines. When to call your doctor for help. Supplies needed: Soapy water. Clean, plain water. Clean washcloth. Bandage (dressing). This is optional. Syringe. How to care for a PEG tube Check your PEG tube every day. Make sure: It is not too tight. It  is in the correct place. There is a mark on the tube that shows when the tube is in the correct place. Adjust the tube if you need to. Cleaning your stoma Clean your stoma every day. Follow these steps: Wash your hands with soap and water for at least 20 seconds. If you cannot use soap and water, use hand sanitizer. Check your stoma. Let your doctor know if there is: Redness. Swelling. Leaking. Skin irritation. Wash the stoma gently with warm, soapy water. Rinse the stoma with plain water. Pat the stoma area dry. Place a bandage over the stoma if your doctor told you to do that.  Giving yourself a feeding Your doctor will tell you: How much nutrition and fluid you will need for each feeding. How often to have a feeding. Whether you should take medicine in the tube by itself or with a feeding. To give yourself a feeding, follow these steps: Ashley Camacho out all of the things that you will need. Make sure that the nutritional formula is at room temperature. Wash your hands with soap and water for at least 20 seconds. If you cannot use soap and water, use hand sanitizer. Sit up or stand up straight. You will need to stay sitting up or standing up while you give yourself a feeding. Make sure the syringe plunger is pushed in. Place the tip of the syringe in clean water, and slowly pull the plunger to bring (draw up) the water into the syringe. Remove the clamp and the cap from the PEG tube. Push the water out of the syringe to clean (flush) the tube. If the tube is clear, draw up the formula into the syringe. Make sure to use the right amount for each feeding. Add water if you need to. Slowly push the formula from the syringe through the tube. After the feeding, flush the tube with water. Put the clamp and the cap on the tube. Stay sitting up or standing up straight for at least 30 minutes. Use the feeding tube equipment, such as syringes and connectors, as told by your doctor. Giving yourself  medicine To give yourself medicine, follow these steps: Lay out all of the things that you will need. If your medicine is in tablet form, crush it and dissolve it in water. Wash your hands with soap and water for at least 20 seconds. If you cannot use soap and water, use hand sanitizer. Sit up or stand up straight. You will need to stay sitting up or standing up while you give yourself medicine. Make sure the syringe plunger is pushed in. Place the tip of the syringe in clean water, and slowly pull the plunger to bring (draw up) the water into the syringe. Remove the clamp and the cap from the PEG tube. Push the water out of the syringe to clean (flush) the tube. If the tube is clear, draw up the medicine into the syringe. Slowly push the medicine from the syringe through the tube. Flush the tube with water. Put the clamp and the cap on the  tube. Stay sitting up or standing up straight for at least 30 minutes. Do not take sustained release (SR) medicines through your tube. If you are not sure if your medicine is an SR medicine, ask your doctor. Contact a doctor if: The area around your stoma is sore, irritated, or red. You have belly pain or bloating while you are feeding or after you feed. You feel like you may vomit (nauseous) and the feeling will not go away. You have trouble pooping (constipation) or you have watery poop (diarrhea) for a long time. You have a fever. You have problems with your PEG tube. Get help right away if: Your tube is blocked. Your tube falls out. You have pain around your stoma. You are bleeding from your stoma. Your tube is leaking. You choke or you have trouble breathing while you are feeding or after you feed. Summary A PEG tube is used to put food and fluids into the stomach. Before you leave the hospital, you will be taught how to use and care for your PEG tube. Your doctor will give you instructions on how to give yourself feedings and  medicines. Contact your doctor if you have a fever or soreness, redness, or irritation around your stoma. Get help right away if your tube leaks, is blocked, or falls out. Also, get help right away if you have pain or bleeding around your stoma. This information is not intended to replace advice given to you by your health care provider. Make sure you discuss any questions you have with your health care provider. Document Revised: 04/30/2020 Document Reviewed: 09/21/2019 Elsevier Patient Education  2023 [employer].

## 2022-11-01 NOTE — Assessment & Plan Note (Addendum)
Status post insulin 5 units one-time dose, sodium chloride 1 L bolus We will continue with sodium chloride infusion at 150 mL/h Admit to telemetry medical, inpatient

## 2022-11-01 NOTE — Assessment & Plan Note (Addendum)
Insulin long-acting, 4 units subcutaneous twice daily resumed Insulin SSI with at bedtime coverage ordered Goal inpatient blood glucose levels 140-180

## 2022-11-01 NOTE — Assessment & Plan Note (Addendum)
Patient states she did not have a refill for her antidepression/anxiety medication Ativan 0.5 mg IV every 6 hours as needed for anxiety, 1 day ordered

## 2022-11-01 NOTE — Assessment & Plan Note (Signed)
-   Levothyroxine 50 mcg daily before breakfast resumed 

## 2022-11-01 NOTE — Assessment & Plan Note (Signed)
Patient has not taken her Cortef over the last 2 days due to nausea Resumed home Cortef 20 mg in the morning and 10 mg at with supper

## 2022-11-01 NOTE — Assessment & Plan Note (Addendum)
Status post sodium chloride bolus 1 L per EDP Continue with sodium chloride infusion at 150 mL/h, 1 day ordered Strict I's and O's

## 2022-11-01 NOTE — Progress Notes (Signed)
Outpatient Surgical Follow Up  11/01/2022  Ashley Camacho is an 32 y.o. female.   Chief Complaint  Patient presents with   Follow-up    Jejunostomy 06/13/22 with peg tube    HPI: Ashley Camacho is a 32 yo w malnutrition s/p open jejunostomy tube 06/13/22.  Have a complex history including type 1 diabetes severe gastroparesis hypertension, adrenal insufficiency and severe malnutrition. Comes in with abdominal pain in the left side and apparently some nausea vomiting dehydration.  Every time apparently she tries to use the jejunostomy tube it hurts Suspicious that she did have an emergency room appointment and visit about a month ago but she has not tried to use her feeding tube.  Moreover she did have an appointment last week with me and missed that appointment.  She states that she had issues with her kids.  I was very honest and candid with the patient and family and thought that she needed to be admitted for nutritional support, electrolyte and fluid correction.  I do think that we need to work her up with a CT of the abdomen and pelvis again. Has lost 10 pounds within the last month Did have an ER visit last month and at that time was found to be hyperkalemic and a CT scan was performed showing no acute abnormalities, noted to have personally review the CT.  I think at that time patient was prescribed a biotics for potential cellulitis of the abdominal wall.   Past Medical History:  Diagnosis Date   Acute metabolic encephalopathy 06/10/2022   Adrenal insufficiency (HCC)    Anemia    Gastroparesis    Hypertension    Type 1 diabetes [facility])     Past Surgical History:  Procedure Laterality Date   BIOPSY  01/14/2021   Procedure: BIOPSY;  Surgeon: Tressia Danas, MD;  Location: [facility] ENDOSCOPY;  Service: Gastroenterology;;   CESAREAN SECTION     CESAREAN SECTION WITH BILATERAL TUBAL LIGATION N/A 02/17/2021   Procedure: CESAREAN SECTION WITH BILATERAL TUBAL LIGATION;  Surgeon: [name],  DO;  Location: [facility] LD ORS;  Service: Obstetrics;  Laterality: N/A;   ESOPHAGOGASTRODUODENOSCOPY (EGD) WITH PROPOFOL N/A 01/14/2021   Procedure: ESOPHAGOGASTRODUODENOSCOPY (EGD) WITH PROPOFOL;  Surgeon: Tressia Danas, MD;  Location: [facility] ENDOSCOPY;  Service: Gastroenterology;  Laterality: N/A;   JEJUNOSTOMY N/A 06/13/2022   Procedure: JEJUNOSTOMY;  Surgeon: Leafy Ro, MD;  Location: [facility] ORS;  Service: General;  Laterality: N/A;   MOUTH SURGERY      Family History  Problem Relation Age of Onset   Diabetes type I Father    CAD Father    CAD Paternal Grandmother    CAD Paternal Grandfather    Breast cancer Mother    Ovarian cancer Neg Hx     Social History:  reports that she has never smoked. She has never been exposed to tobacco smoke. She has never used smokeless tobacco. She reports that she does not drink alcohol and does not use drugs.  Allergies: No Known Allergies  Medications reviewed.    ROS Full ROS performed and is otherwise negative other than what is stated in HPI   BP 90/62   Pulse (!) 111   Temp 97.7 F (36.5 C) (Oral)   Ht 5\' 1"  (1.549 m)   Wt 89 lb (40.4 kg)   LMP 09/07/2022 (Approximate)   SpO2 93%   BMI 16.82 kg/m   Physical Exam Vitals and nursing note reviewed.  Constitutional:      Comments:  chronically ill.  Pale and severely malnourished with temporal wasting  Cardiovascular:     Rate and Rhythm: Normal rate and regular rhythm.  Pulmonary:     Effort: Pulmonary effort is normal. No respiratory distress.     Breath sounds: No stridor.  Abdominal:     General: Abdomen is flat. There is no distension.     Palpations: There is no mass.     Tenderness: There is abdominal tenderness. There is no right CVA tenderness, guarding or rebound.     Hernia: No hernia is present.     Comments: The stitch holding the feeding tube was not in contact with the skin.  There was an area at the exit site seems to be small bowel mucosa.  I was able to pull on  the feeding tube and it was still in place. Wound was healed without abscess or infection.  Musculoskeletal:        General: Normal range of motion.  Skin:    General: Skin is warm and dry.     Capillary Refill: Capillary refill takes less than 2 seconds.  Neurological:     General: No focal deficit present.     Mental Status: She is alert.  Psychiatric:        Mood and Affect: Mood normal.        Behavior: Behavior normal.        Thought Content: Thought content normal.        Judgment: Judgment normal.     Assessment/Plan: 32 year old female with complex medical history presents with failure to thrive, severe dehydration and severe malnutrition.  Discussed with her in detail with the mother about the need for hospitalization.  I will send her to the ER  I was very honest and candid with the patient and family and thought that she needed to be admitted for nutritional support, electrolyte and fluid correction.  I do think that we need to work her up with a CT of the abdomen and pelvis again. I am not sure about potential DSM V eating disorder. May need to be interrogated by hospitalist and mental health professional Seems on exam that they might be some of the bowel mucosa next to the exit site of the feeding tube.  Not peritonitic and I do think that this is a consequence of severe malnutrition and her inability to heal. Very difficult situation.  Will be happy to see her and follow-up while she is in the hospital Please note that I spent 40 minutes in this encounter including personally reviewing imaging studies, coordinating her care, performing appropriate documentation  Sterling Big, MD [facility] General Surgeon

## 2022-11-01 NOTE — Assessment & Plan Note (Addendum)
On CT UA has been ordered and pending collection Ceftriaxone 1 g IV one-time dose ordered, a.m. team to order antibiotic pending UA

## 2022-11-01 NOTE — ED Triage Notes (Signed)
Pt to ed from home via POV bc she was sent here for poor nutritional status by her general surgeon. Pt has a feeding tube and has been unable to access it and poor PO intake as well for the last week. Pt is caox4, in no acute distress in triage.

## 2022-11-02 ENCOUNTER — Inpatient Hospital Stay: Payer: Medicaid Other

## 2022-11-02 DIAGNOSIS — E860 Dehydration: Secondary | ICD-10-CM

## 2022-11-02 DIAGNOSIS — E43 Unspecified severe protein-calorie malnutrition: Secondary | ICD-10-CM

## 2022-11-02 DIAGNOSIS — E875 Hyperkalemia: Secondary | ICD-10-CM | POA: Diagnosis not present

## 2022-11-02 DIAGNOSIS — E1065 Type 1 diabetes mellitus with hyperglycemia: Secondary | ICD-10-CM

## 2022-11-02 DIAGNOSIS — E46 Unspecified protein-calorie malnutrition: Secondary | ICD-10-CM | POA: Diagnosis not present

## 2022-11-02 LAB — URINALYSIS, COMPLETE (UACMP) WITH MICROSCOPIC
Bacteria, UA: NONE SEEN
Bilirubin Urine: NEGATIVE
Glucose, UA: 150 mg/dL — AB
Hgb urine dipstick: NEGATIVE
Ketones, ur: NEGATIVE mg/dL
Nitrite: NEGATIVE
Protein, ur: NEGATIVE mg/dL
Specific Gravity, Urine: 1.039 — ABNORMAL HIGH (ref 1.005–1.030)
WBC, UA: >50 WBC/hpf (ref 0–5)
pH: 5 (ref 5.0–8.0)

## 2022-11-02 LAB — PHOSPHORUS: Phosphorus: 2.7 mg/dL (ref 2.5–4.6)

## 2022-11-02 LAB — BASIC METABOLIC PANEL
Anion gap: 8 (ref 5–15)
BUN: 20 mg/dL (ref 6–20)
CO2: 23 mmol/L (ref 22–32)
Calcium: 8.1 mg/dL — ABNORMAL LOW (ref 8.9–10.3)
Chloride: 111 mmol/L (ref 98–111)
Creatinine, Ser: 1.39 mg/dL — ABNORMAL HIGH (ref 0.44–1.00)
GFR, Estimated: 52 mL/min — ABNORMAL LOW (ref >60)
Glucose, Bld: 59 mg/dL — ABNORMAL LOW (ref 70–99)
Potassium: 4.7 mmol/L (ref 3.5–5.1)
Sodium: 142 mmol/L (ref 135–145)

## 2022-11-02 LAB — CBC
HCT: 24.7 % — ABNORMAL LOW (ref 36.0–46.0)
Hemoglobin: 8.4 g/dL — ABNORMAL LOW (ref 12.0–15.0)
MCH: 29.9 pg (ref 26.0–34.0)
MCHC: 34 g/dL (ref 30.0–36.0)
MCV: 87.9 fL (ref 80.0–100.0)
Platelets: 127 10*3/uL — ABNORMAL LOW (ref 150–400)
RBC: 2.81 MIL/uL — ABNORMAL LOW (ref 3.87–5.11)
RDW: 11.6 % (ref 11.5–15.5)
WBC: 2.9 10*3/uL — ABNORMAL LOW (ref 4.0–10.5)
nRBC: 0 % (ref 0.0–0.2)

## 2022-11-02 LAB — GLUCOSE, CAPILLARY
Glucose-Capillary: 66 mg/dL — ABNORMAL LOW (ref 70–99)
Glucose-Capillary: 172 mg/dL — ABNORMAL HIGH (ref 70–99)
Glucose-Capillary: 212 mg/dL — ABNORMAL HIGH (ref 70–99)
Glucose-Capillary: 65 mg/dL — ABNORMAL LOW (ref 70–99)
Glucose-Capillary: 116 mg/dL — ABNORMAL HIGH (ref 70–99)

## 2022-11-02 LAB — MAGNESIUM: Magnesium: 1.8 mg/dL (ref 1.7–2.4)

## 2022-11-02 LAB — HEMOGLOBIN A1C
Hgb A1c MFr Bld: 9.4 % — ABNORMAL HIGH (ref 4.8–5.6)
Mean Plasma Glucose: 223.08 mg/dL

## 2022-11-02 LAB — BLOOD GAS, VENOUS
O2 Saturation: 28.4 %
Patient temperature: 37

## 2022-11-02 MED ORDER — SODIUM CHLORIDE 0.9 % IV SOLN: 1.0000 g | Freq: Once | INTRAVENOUS | Status: DC

## 2022-11-02 MED ORDER — LORAZEPAM 2 MG/ML IJ SOLN
0.5000 mg | Freq: Four times a day (QID) | INTRAMUSCULAR | Status: DC | PRN
  Administered 2022-11-04 – 2022-11-09 (×5): 0.5 mg via INTRAVENOUS
  Filled 2022-11-02 (×5): qty 1

## 2022-11-02 MED ORDER — GLUCERNA 1.5 CAL PO LIQD
1000.0000 mL | ORAL | Status: DC
  Administered 2022-11-02 – 2022-11-04 (×3): 1000 mL

## 2022-11-02 MED ORDER — ENSURE ENLIVE PO LIQD: 237.0000 mL | Freq: Three times a day (TID) | ORAL | Status: DC

## 2022-11-02 MED ORDER — MORPHINE SULFATE (PF) 2 MG/ML IV SOLN
2.0000 mg | INTRAVENOUS | Status: DC | PRN
  Administered 2022-11-07 – 2022-11-08 (×5): 2 mg via INTRAVENOUS
  Filled 2022-11-02 (×5): qty 1

## 2022-11-02 MED ORDER — VITAL HIGH PROTEIN PO LIQD: 1000.0000 mL | ORAL | Status: DC

## 2022-11-02 MED ORDER — THIAMINE MONONITRATE 100 MG PO TABS
100.0000 mg | ORAL_TABLET | Freq: Every day | ORAL | Status: DC
  Administered 2022-11-02 – 2022-11-15 (×14): 100 mg via ORAL
  Filled 2022-11-02 (×14): qty 1

## 2022-11-02 MED ORDER — DIATRIZOATE MEGLUMINE & SODIUM 66-10 % PO SOLN
30.0000 mL | Freq: Once | ORAL | Status: AC
  Administered 2022-11-02: 30 mL

## 2022-11-02 MED ORDER — SODIUM CHLORIDE 0.9 % IV SOLN
12.5000 mg | Freq: Four times a day (QID) | INTRAVENOUS | Status: DC | PRN
  Filled 2022-11-02 (×2): qty 0.5

## 2022-11-02 MED ORDER — FREE WATER
30.0000 mL | Status: DC
  Administered 2022-11-02 – 2022-11-15 (×64): 30 mL

## 2022-11-02 MED ORDER — INSULIN GLARGINE-YFGN 100 UNIT/ML ~~LOC~~ SOLN
2.0000 [IU] | Freq: Two times a day (BID) | SUBCUTANEOUS | Status: DC
  Administered 2022-11-02 – 2022-11-08 (×12): 2 [IU] via SUBCUTANEOUS
  Filled 2022-11-02 (×13): qty 0.02

## 2022-11-02 MED ORDER — SODIUM CHLORIDE 0.9 % IV SOLN
1.0000 g | INTRAVENOUS | Status: DC
  Administered 2022-11-02: 1 g via INTRAVENOUS
  Filled 2022-11-02: qty 10

## 2022-11-02 MED ORDER — DEXTROSE 50 % IV SOLN
12.5000 g | INTRAVENOUS | Status: AC
  Administered 2022-11-02: 12.5 g via INTRAVENOUS
  Filled 2022-11-02: qty 50

## 2022-11-02 MED ORDER — ADULT MULTIVITAMIN W/MINERALS CH
1.0000 | ORAL_TABLET | Freq: Every day | ORAL | Status: DC
  Administered 2022-11-02 – 2022-11-15 (×14): 1 via ORAL
  Filled 2022-11-02 (×14): qty 1

## 2022-11-02 NOTE — Progress Notes (Signed)
Initial Nutrition Assessment  DOCUMENTATION CODES:   Underweight, Severe malnutrition in context of social or environmental circumstances  INTERVENTION:   -Liberalize diet to regular for widest variety of meal selections -MVI with minerals daily -Ensure Enlive po TID, each supplement provides 350 kcal and 20 grams of protein -Due to high risk for refeeding syndrome, add 100 mg thiamine x 5 days -Monitor Mg, K, and Phos daily and replete as needed due to high refeeding risk -Resume TF via j-tube:  Initiate Glucerna 1.5 @ 20 ml/hr and increase by 10 ml every 12 hours to goal rate of 50 ml/hr.    30 ml free water flush every 4 hours    Tube feeding regimen provides 1800 kcal (100% of needs), 99 grams of protein, and 911 ml of H2O.  Total free water: 969 ml daily  NUTRITION DIAGNOSIS:   Severe Malnutrition related to social / environmental circumstances as evidenced by moderate fat depletion, severe fat depletion, moderate muscle depletion, severe muscle depletion, percent weight loss.  GOAL:   Patient will meet greater than or equal to 90% of their needs  MONITOR:   PO intake, Supplement acceptance, TF tolerance  REASON FOR ASSESSMENT:   Consult Assessment of nutrition requirement/status  ASSESSMENT:   Pt with insulin-dependent diabetes mellitus type 1, severe gastroparesis, severe malnutrition, status post jejunostomy tube feed, adrenal insufficiency, CKD stage IIIb, hypertension, hypothyroid, who presents for left-sided abdominal pain, nausea, vomiting and dehydration.  Pt admitted with hyperkalemia.   Reviewed I/O's: +711 ml x 24 hours  Spoke with pt at bedside, who reports feeling a little bit better today. She shares that he has experienced a general decline in health over the past 3 months since hospital discharge. Pt admits to not administering her TF; she shares that at first she was inconsistent with them, but has not done so for about a month. Pt explains that it  has been difficult for her to administer her TF due to caring for her one year and as well as having minimal support at home. Pt reports she had an infection around her TF site a few weeks ago. This improved with antibiotics, but still hurts to touch. She reports she is not able to determine if it hurts to use the tube, as she has not been using it.   Pt shares that it has been very difficult for her to go to needed doctor's appointments as she often does not have childcare for her children. She has been unable to follow-up with endocrinology as she has been dismissed in the past secondary to multiple no shows; pt was given a CGM with sensor during last admission, which she has been using, however, only has 1-2 sensors left as she has been unable to refill them secondary to lack of follow-up with endocrinology.   Per pt, she was able to keep some food down until about 1-2 weeks ago. Pt shares that she was only eating bites and sips and then was unable to keep down even water without if coming back up.   Case discussed with MD, RN, and TOC. Plan to investigate potential for patient advocate and other resources. Per general surgery, ok to start TF slowly. Pt complained of hunger and wanted to eat, however, hesitant to do TF "as I can't do them at home". Pt shares that she has supplies at home as she has not been using them. Discussed importance of TF as well as PO intake to help promote healing.   Reviewed  wt hx; pt has experienced a 8.9% wt loss over the past 3 months, which is significant for time frame. Per pt, she was around 100# when she left the hospital a few months ago and is now down to 89#.   Medications reviewed and include ferrous sulfate and 0.9% sodium chloride infusion @ 150 ml/hr.   Lab Results  Component Value Date   HGBA1C 9.4 (H) 11/02/2022   PTA DM medications are 0-5 units insulin aspart daily at bedtime, 2 units insulin aspart TID with meals, and 4 units insulin glargine BID.    Labs reviewed: CBGS: 65 (inpatient orders for glycemic control are 0-5 units insulin aspart daily at bedtime, 0-9 units insulin aspart TID with meals, and 2 units insulin glargine-yfgn BID).    NUTRITION - FOCUSED PHYSICAL EXAM:  Flowsheet Row Most Recent Value  Orbital Region Moderate depletion  Upper Arm Region Severe depletion  Thoracic and Lumbar Region Severe depletion  Buccal Region Moderate depletion  Temple Region Moderate depletion  Clavicle Bone Region Severe depletion  Clavicle and Acromion Bone Region Severe depletion  Scapular Bone Region Severe depletion  Dorsal Hand Severe depletion  Patellar Region Severe depletion  Anterior Thigh Region Severe depletion  Posterior Calf Region Severe depletion  Edema (RD Assessment) None  Hair Reviewed  Eyes Reviewed  Mouth Reviewed  Skin Reviewed  Nails Reviewed       Diet Order:   Diet Order             Diet regular Fluid consistency: Thin  Diet effective now                   EDUCATION NEEDS:   Education needs have been addressed  Skin:  Skin Assessment: Reviewed RN Assessment  Last BM:  10/31/22  Height:   Ht Readings from Last 1 Encounters:  11/01/22 5\' 1"  (1.549 m)    Weight:   Wt Readings from Last 1 Encounters:  11/01/22 40 kg    Ideal Body Weight:  47.7 kg  BMI:  Body mass index is 16.66 kg/m.  Estimated Nutritional Needs:   Kcal:  1600-1800  Protein:  85-100 grams  Fluid:  > 1.6 L    Levada Schilling, RD, LDN, CDCES Registered Dietitian II Certified Diabetes Care and Education Specialist Please refer to [facility] for RD and/or RD on-call/weekend/after hours pager

## 2022-11-02 NOTE — Inpatient Diabetes Management (Signed)
Inpatient Diabetes Program Recommendations  AACE/ADA: New Consensus Statement on Inpatient Glycemic Control   Target Ranges:  Prepandial:   less than 140 mg/dL      Peak postprandial:   less than 180 mg/dL (1-2 hours)      Critically ill patients:  140 - 180 mg/dL    Latest Reference Range & Units 11/01/22 21:32  Glucose-Capillary 70 - 99 mg/dL 914 (H)    Latest Reference Range & Units 11/01/22 15:32 11/02/22 03:54  Glucose 70 - 99 mg/dL 782 (H) 59 (L)   Review of Glycemic Control  Diabetes history: DM1 (does NOT make any insulin; requires basal, correction, and carb coverage insulin; very sensitive to insulin) Outpatient Diabetes medications: Basaglar 4 units BID, Novolog 2 units TID with meals plus correction Current orders for Inpatient glycemic control: Semglee 4 units BID, Novolog 0-9 units TID with meals, Novolog 0-5 units QHS; Cortef 20 mg QAM, Cortef 10 mg QPM   Inpatient Diabetes Program Recommendations:    Insulin: Lab glucose 59 mg/dl at 9:56 am today. Please consider decreasing Semglee to 2 units BID and change CBGs to Q4H and Novolog to 0-6 units Q4H.  Thanks, Orlando Penner, RN, MSN, CDCES Diabetes Coordinator Inpatient Diabetes Program [phone] (Team Pager from 8am to 5pm)

## 2022-11-02 NOTE — Consult Note (Signed)
Patient ID: Ashley Camacho, female   DOB: [birth year], 32 y.o.   MRN: [id number]  HPI Ashley Camacho is a 32 y.o. female seen in consultation at the request of Dr Lyn Hollingshead Have a complex history I placed a jejunostomy tube 5 months ago.  She is got severe malnutrition and significant social and psychiatric issues. He was admitted last night due to severe uncontrolled diabetes as well as dehydration.  She does endorse chronic abdominal pain and next in the left side.  She did have a CT scan as well as a Gastrografin contrast via the jejunostomy tube.  Does not seem to be any extravasation of contrast and has not seem to have any complication or abscess intra-abdominal he. He feels better this morning.  Still nauseated  HPI  Past Medical History:  Diagnosis Date   Acute metabolic encephalopathy 06/10/2022   Adrenal insufficiency (HCC)    Anemia    Gastroparesis    Hypertension    Type 1 diabetes [facility])     Past Surgical History:  Procedure Laterality Date   BIOPSY  01/14/2021   Procedure: BIOPSY;  Surgeon: Tressia Danas, MD;  Location: [facility] ENDOSCOPY;  Service: Gastroenterology;;   CESAREAN SECTION     CESAREAN SECTION WITH BILATERAL TUBAL LIGATION N/A 02/17/2021   Procedure: CESAREAN SECTION WITH BILATERAL TUBAL LIGATION;  Surgeon: [name], DO;  Location: [facility] LD ORS;  Service: Obstetrics;  Laterality: N/A;   ESOPHAGOGASTRODUODENOSCOPY (EGD) WITH PROPOFOL N/A 01/14/2021   Procedure: ESOPHAGOGASTRODUODENOSCOPY (EGD) WITH PROPOFOL;  Surgeon: Tressia Danas, MD;  Location: [facility] ENDOSCOPY;  Service: Gastroenterology;  Laterality: N/A;   JEJUNOSTOMY N/A 06/13/2022   Procedure: JEJUNOSTOMY;  Surgeon: Leafy Ro, MD;  Location: [facility] ORS;  Service: General;  Laterality: N/A;   MOUTH SURGERY      Family History  Problem Relation Age of Onset   Diabetes type I Father    CAD Father    CAD Paternal Grandmother    CAD Paternal Grandfather    Breast cancer Mother    Ovarian cancer  Neg Hx     Social History Social History   Tobacco Use   Smoking status: Never    Passive exposure: Never   Smokeless tobacco: Never  Vaping Use   Vaping Use: Never used  Substance Use Topics   Alcohol use: No   Drug use: No    No Known Allergies  Current Facility-Administered Medications  Medication Dose Route Frequency Provider Last Rate Last Admin   0.9 %  sodium chloride infusion   Intravenous Continuous Cox, Amy N, DO 150 mL/hr at 11/02/22 0613 New Bag at 11/02/22 4098   acetaminophen (TYLENOL) tablet 650 mg  650 mg Oral Q6H PRN Cox, Amy N, DO       Or   acetaminophen (TYLENOL) suppository 650 mg  650 mg Rectal Q6H PRN Cox, Amy N, DO       cefTRIAXone (ROCEPHIN) 1 g in sodium chloride 0.9 % 100 mL IVPB  1 g Intravenous Q24H Sunnie Nielsen, DO       divalproex (DEPAKOTE ER) 24 hr tablet 250 mg  250 mg Oral Daily Cox, Amy N, DO   250 mg at 11/02/22 0910   fentaNYL (SUBLIMAZE) injection 12.5 mcg  12.5 mcg Intravenous Q4H PRN Cox, Amy N, DO       ferrous sulfate tablet 325 mg  325 mg Oral Q breakfast Cox, Amy N, DO   325 mg at 11/02/22 0910   heparin injection 5,000 Units  5,000 Units Subcutaneous Q8H Cox, Amy N, DO       hydrALAZINE (APRESOLINE) injection 5 mg  5 mg Intravenous Q8H PRN Cox, Amy N, DO       hydrocortisone (CORTEF) tablet 20 mg  20 mg Oral Daily Cox, Amy N, DO   20 mg at 11/02/22 1610   And   hydrocortisone (CORTEF) tablet 10 mg  10 mg Oral QHS Cox, Amy N, DO   10 mg at 11/01/22 2146   insulin aspart (novoLOG) injection 0-5 Units  0-5 Units Subcutaneous QHS Cox, Amy N, DO       insulin aspart (novoLOG) injection 0-9 Units  0-9 Units Subcutaneous TID WC Cox, Amy N, DO       insulin glargine-yfgn (SEMGLEE) injection 4 Units  4 Units Subcutaneous BID Cox, Amy N, DO   4 Units at 11/01/22 2146   levothyroxine (SYNTHROID) tablet 50 mcg  50 mcg Oral QAC breakfast Cox, Amy N, DO   50 mcg at 11/02/22 9604   LORazepam (ATIVAN) injection 0.5 mg  0.5 mg Intravenous Q6H  PRN Sunnie Nielsen, DO       morphine (PF) 2 MG/ML injection 2 mg  2 mg Intravenous Q4H PRN Cox, Amy N, DO   2 mg at 11/02/22 0111   ondansetron (ZOFRAN) tablet 4 mg  4 mg Oral Q6H PRN Cox, Amy N, DO       Or   ondansetron (ZOFRAN) injection 4 mg  4 mg Intravenous Q6H PRN Cox, Amy N, DO   4 mg at 11/02/22 0403   promethazine (PHENERGAN) 12.5 mg in sodium chloride 0.9 % 50 mL IVPB  12.5 mg Intravenous Q6H PRN Cox, Amy N, DO 200 mL/hr at 11/02/22 0500 12.5 mg at 11/02/22 0500   senna-docusate (Senokot-S) tablet 1 tablet  1 tablet Oral QHS PRN Cox, Amy N, DO         Review of Systems Full ROS  was asked and was negative except for the information on the HPI  Physical Exam Blood pressure 112/80, pulse 72, temperature 97.8 F (36.6 C), temperature source Oral, resp. rate 16, height 5\' 1"  (1.549 m), weight 40 kg, last menstrual period 09/07/2022, SpO2 100 %, not currently breastfeeding. CONSTITUTIONAL: NAD. EYES: Pupils are equal, round, and reactive to light, Sclera are non-icteric. EARS, NOSE, MOUTH AND THROAT: The oropharynx is clear. The oral mucosa is pink and moist. Hearing is intact to voice. LYMPH NODES:  Lymph nodes in the neck are normal. RESPIRATORY:  Lungs are clear. There is normal respiratory effort, with equal breath sounds bilaterally, and without pathologic use of accessory muscles. CARDIOVASCULAR: Heart is regular without murmurs, gallops, or rubs. GI: The abdomen is soft, mild tenderness around J tube.  nondistended. There are no palpable masses. There is no hepatosplenomegaly. There are normal bowel sounds in all quadrants. Jejunostomy tube in place. No peritonitis GU: Rectal deferred.   MUSCULOSKELETAL: Normal muscle strength and tone. No cyanosis or edema.   SKIN: Turgor is good and there are no pathologic skin lesions or ulcers. NEUROLOGIC: Motor and sensation is grossly normal. Cranial nerves are grossly intact. PSYCH:  Oriented to person, place and time. Affect is  normal.  Data Reviewed  I have personally reviewed the patient's imaging, laboratory findings and medical records.    Assessment/Plan 32 year old female with type 1 diabetes and severe malnutrition presents with dehydration and uncontrolled hyperglycemia.  She does have a lot of social and likely mental health issues. Very difficult situation.  From  my standpoint reviewed the jejunostomy tube is working and is within the bowel lumen without evidence of obvious complications.  May start tube feeds again We will be available.  No Need for surgical intervention.    Sterling Big, MD FACS General Surgeon 11/02/2022, 9:43 AM

## 2022-11-02 NOTE — Progress Notes (Addendum)
PROGRESS NOTE    Ashley Camacho   UJW:[id number] DOB: 1990-12-28  DOA: 11/01/2022 Date of Service: 11/02/22 PCP: Oneita Hurt, No     Brief Narrative / Hospital Course:  Ashley Camacho is a 32 year old female with insulin-dependent diabetes mellitus type 1, severe gastroparesis, severe malnutrition, status post jejunostomy tube feed, adrenal insufficiency, CKD stage IIIb, hypertension, hypothyroid, who presents to the emergency department at the advice of general surgeon for left-sided abdominal pain, nausea, vomiting and dehydration. Per general surgeon, patient tries to use the jejunostomy tube but it hurts. 06/10: in ED, VSS, Serum sodium 133, potassium 5.9, chloride 102, bicarb 21, BUN of 22, serum creatinine of 1.71, EGFR 41, nonfasting blood glucose 156, WBC 4.2, hemoglobin 8.8, platelets of 152. Anion gap was not elevated at 10.  VBG was 7.27/51/less than 31. ED treatment: Insulin 5 units, morphine 4 mg IV one-time dose, sodium chloride 1 L bolus. Admitted to hospitalist service  06/11: General surgery to see - ok for slow Jtube feeds and recs for [facility] eval. Pt unable to take po at this time but still very labile Glc. TOC to discuss outpatient resources to maintain care w/ endocrinology and get help w/ transportation.    Consultants:  General Surgery   Procedures: none      ASSESSMENT & PLAN:   Principal Problem:   Hyperkalemia Active Problems:   Gastroparesis   Adrenal insufficiency (HCC)   Hypothyroidism   Anxiety   Brittle diabetes (HCC)   Diabetic sensorimotor neuropathy (HCC)   Diabetic retinopathy (HCC)   CKD stage 3 due to type 1 diabetes mellitus (HCC)   Essential hypertension   Adrenal cortical hypofunction (HCC)   Protein-calorie malnutrition, severe (HCC)   CKD stage 3a, GFR 45-59 ml/min (HCC)   Elevated serum creatinine   Abdominal pain   Cystitis   Hyperkalemia - resolved Monitor BMP  Elevated serum creatinine/AKI on CKD3a - improved/resolved   sodium chloride infusion at 150 mL/h, 1 day ordered Strict I's and O's  Protein-calorie malnutrition, severe (HCC) Registered dietitian has been consulted Tube feeds ok to restart per surgery team Diet as able  Dr Everlene Farrier recs BH eval as well - consult placed and message acknowledged BH will see tomorrow   Type 1 diabetes mellitus (HCC) Insulin long-acting, 4 units subcutaneous twice daily resumed Insulin SSI with at bedtime coverage ordered Goal inpatient blood glucose levels 140-180 Very labile Glc, chronic   Abdominal pain General surgery will follow regarding jejunostomy tube Symptomatic support: Morphine 2 mg IV every 4 hours as needed for moderate pain, 18 hours ordered; fentanyl 12.5 mcg IV every 4 hours as needed for severe pain, 18 hours ordered  Essential hypertension Hydralazine 5 mg IV every 8 hours as needed for SBP greater 175, 4 days ordered  Anxiety Patient states she did not have a refill for her antidepression/anxiety medication Ativan 0.5 mg IV every 6 hours as needed for anxiety  Hypothyroidism Levothyroxine 50 mcg daily before breakfast resumed  Adrenal insufficiency (HCC) Patient had not taken her Cortef over the last 2 days PTA due to nausea Resumed home Cortef 20 mg in the morning and 10 mg at with supper  Cystitis / UTI Ceftriaxone pending urine culture     DVT prophylaxis: refusing SND and lovenox/heparin  Pertinent IV fluids/nutrition: NS 150 mL/h, diabetic diet as tolerated  Central lines / invasive devices: J-tube  Code Status: FULL CODE  ACP documentation reviewed: 11/02/22 none on file   Current Admission Status: inpatient  TOC needs / Dispo plan: TBD, expect home to previous home environment +/- HH  Barriers to discharge / significant pending items: general surgery recs, possible surgical intervention for J-tube              Subjective / Brief ROS:  Patient reports still not able to eat, feels nauseous and dry heaves w/ po  intake  Denies CP/SOB.  Pain controlled.  Denies new weakness.  Reports no concerns w/ urination - denies dysuria Alternates constipation/diarrhea but nothing concerning in past few days   Family Communication: none at this time     Objective Findings:  Vitals:   11/01/22 1527 11/01/22 2035 11/02/22 0715 11/02/22 1629  BP: 96/67 113/77 112/80 93/62  Pulse: 95 68 72 72  Resp: 16 16 16 16   Temp: 98 F (36.7 C) (!) 97.5 F (36.4 C) 97.8 F (36.6 C) (!) 97.5 F (36.4 C)  TempSrc: Oral Oral Oral   SpO2: 100% 100% 100% 100%  Weight: 40 kg     Height: 5\' 1"  (1.549 m)       Intake/Output Summary (Last 24 hours) at 11/02/2022 1718 Last data filed at 11/02/2022 0300 Gross per 24 hour  Intake 710.93 ml  Output --  Net 710.93 ml   Filed Weights   11/01/22 1527  Weight: 40 kg    Examination:  Physical Exam Constitutional:      General: She is not in acute distress.    Appearance: She is ill-appearing.  Cardiovascular:     Rate and Rhythm: Normal rate and regular rhythm.  Pulmonary:     Effort: Pulmonary effort is normal.     Breath sounds: Normal breath sounds.  Abdominal:     General: Bowel sounds are normal. There is no distension.     Palpations: Abdomen is soft.     Tenderness: There is abdominal tenderness (adjacent to Jtube). There is no guarding or rebound.  Musculoskeletal:     Right lower leg: No edema.     Left lower leg: No edema.  Neurological:     Mental Status: She is alert. Mental status is at baseline.  Psychiatric:        Mood and Affect: Mood normal.        Behavior: Behavior normal.          Scheduled Medications:   divalproex  250 mg Oral Daily   feeding supplement  237 mL Oral TID BM   ferrous sulfate  325 mg Oral Q breakfast   hydrocortisone  20 mg Oral Daily   And   hydrocortisone  10 mg Oral QHS   insulin aspart  0-5 Units Subcutaneous QHS   insulin aspart  0-9 Units Subcutaneous TID WC   insulin glargine-yfgn  2 Units  Subcutaneous BID   levothyroxine  50 mcg Oral QAC breakfast   multivitamin with minerals  1 tablet Oral Daily    Continuous Infusions:  sodium chloride 150 mL/hr at 11/02/22 1321   cefTRIAXone (ROCEPHIN)  IV     promethazine (PHENERGAN) injection (IM or IVPB)      PRN Medications:  acetaminophen **OR** acetaminophen, hydrALAZINE, LORazepam, morphine injection, promethazine (PHENERGAN) injection (IM or IVPB), senna-docusate  Antimicrobials from admission:  Anti-infectives (From admission, onward)    Start     Dose/Rate Route Frequency Ordered Stop   11/02/22 2000  cefTRIAXone (ROCEPHIN) 1 g in sodium chloride 0.9 % 100 mL IVPB  Status:  Discontinued        1 g 200  mL/hr over 30 Minutes Intravenous  Once 11/02/22 0837 11/02/22 0837   11/02/22 2000  cefTRIAXone (ROCEPHIN) 1 g in sodium chloride 0.9 % 100 mL IVPB        1 g 200 mL/hr over 30 Minutes Intravenous Every 24 hours 11/02/22 0837     11/01/22 2100  cefTRIAXone (ROCEPHIN) 1 g in sodium chloride 0.9 % 100 mL IVPB        1 g 200 mL/hr over 30 Minutes Intravenous  Once 11/01/22 2054 11/01/22 2214           Data Reviewed:  I have personally reviewed the following...  CBC: Recent Labs  Lab 11/01/22 1532 11/02/22 0354  WBC 4.2 2.9*  HGB 8.8* 8.4*  HCT 26.5* 24.7*  MCV 88.3 87.9  PLT 152 127*   Basic Metabolic Panel: Recent Labs  Lab 11/01/22 1532 11/01/22 2121 11/02/22 0354  NA 133*  --  142  K 5.9*  --  4.7  CL 102  --  111  CO2 21*  --  23  GLUCOSE 456*  --  59*  BUN 22*  --  20  CREATININE 1.71*  --  1.39*  CALCIUM 8.9  --  8.1*  MG 2.2 1.9  --   PHOS  --  2.4*  --    GFR: Estimated Creatinine Clearance: 37 mL/min (A) (by C-G formula based on SCr of 1.39 mg/dL (H)). Liver Function Tests: Recent Labs  Lab 11/01/22 1532  AST 18  ALT 8  ALKPHOS 64  BILITOT 0.9  PROT 7.1  ALBUMIN 4.3   No results for input(s): "LIPASE", "AMYLASE" in the last 168 hours. No results for input(s): "AMMONIA" in  the last 168 hours. Coagulation Profile: No results for input(s): "INR", "PROTIME" in the last 168 hours. Cardiac Enzymes: No results for input(s): "CKTOTAL", "CKMB", "CKMBINDEX", "TROPONINI" in the last 168 hours. BNP (last 3 results) No results for input(s): "PROBNP" in the last 8760 hours. HbA1C: Recent Labs    11/02/22 0354  HGBA1C 9.4*   CBG: Recent Labs  Lab 11/01/22 2132 11/02/22 0906 11/02/22 0945 11/02/22 1144  GLUCAP 180* 66* 172* 212*   Lipid Profile: No results for input(s): "CHOL", "HDL", "LDLCALC", "TRIG", "CHOLHDL", "LDLDIRECT" in the last 72 hours. Thyroid Function Tests: No results for input(s): "TSH", "T4TOTAL", "FREET4", "T3FREE", "THYROIDAB" in the last 72 hours. Anemia Panel: No results for input(s): "VITAMINB12", "FOLATE", "FERRITIN", "TIBC", "IRON", "RETICCTPCT" in the last 72 hours. Most Recent Urinalysis On File:     Component Value Date/Time   COLORURINE YELLOW (A) 11/02/2022 0418   APPEARANCEUR CLOUDY (A) 11/02/2022 0418   APPEARANCEUR Clear 09/18/2020 1153   LABSPEC 1.039 (H) 11/02/2022 0418   LABSPEC 1.026 03/01/2013 1844   PHURINE 5.0 11/02/2022 0418   GLUCOSEU 150 (A) 11/02/2022 0418   GLUCOSEU >=500 03/01/2013 1844   HGBUR NEGATIVE 11/02/2022 0418   BILIRUBINUR NEGATIVE 11/02/2022 0418   BILIRUBINUR neg 10/23/2020 0953   BILIRUBINUR Negative 09/18/2020 1153   BILIRUBINUR Negative 03/01/2013 1844   KETONESUR NEGATIVE 11/02/2022 0418   PROTEINUR NEGATIVE 11/02/2022 0418   UROBILINOGEN 0.2 10/23/2020 0953   NITRITE NEGATIVE 11/02/2022 0418   LEUKOCYTESUR LARGE (A) 11/02/2022 0418   LEUKOCYTESUR Negative 03/01/2013 1844   Sepsis Labs: @LABRCNTIP (procalcitonin:4,lacticidven:4) Microbiology: No results found for this or any previous visit (from the past 240 hour(s)).    Radiology Studies last 3 days: DG ABDOMEN PEG TUBE LOCATION  Result Date: 11/02/2022 CLINICAL DATA:  [id number] Malnutrition (HCC) [id number] EXAM: ABDOMEN - 1  VIEW  COMPARISON:  11/01/2022 CT FINDINGS: 30 mL of Gastrografin was injected into patient's existing percutaneous jejunostomy tube. Contrast is seen filling nondilated loops of proximal jejunum. No extraluminal contrast collections. Bowel gas pattern is nonobstructive. There is excreted contrast within the urinary bladder. No gross free intraperitoneal air on supine view. IMPRESSION: Percutaneous jejunostomy tube appears appropriately positioned. Electronically Signed   By: Duanne Guess D.O.   On: 11/02/2022 11:02   CT ABDOMEN PELVIS W CONTRAST  Result Date: 11/01/2022 CLINICAL DATA:  Left-sided abdominal pain. Nausea and vomiting. Dehydration. Jejunostomy tube. 10 lb weight loss in past month. EXAM: CT ABDOMEN AND PELVIS WITH CONTRAST TECHNIQUE: Multidetector CT imaging of the abdomen and pelvis was performed using the standard protocol following bolus administration of intravenous contrast. RADIATION DOSE REDUCTION: This exam was performed according to the departmental dose-optimization program which includes automated exposure control, adjustment of the mA and/or kV according to patient size and/or use of iterative reconstruction technique. CONTRAST:  75mL OMNIPAQUE IOHEXOL 300 MG/ML  SOLN COMPARISON:  10/01/2022 FINDINGS: Lower Chest: No acute findings. Hepatobiliary: No hepatic masses identified. Gallbladder is unremarkable. No evidence of biliary ductal dilatation. Pancreas:  No mass or inflammatory changes. Spleen: Within normal limits in size and appearance. Adrenals/Urinary Tract: No suspicious masses identified. No evidence of ureteral calculi or hydronephrosis. Stable mild diffuse bladder wall thickening, suspicious for cystitis. Stomach/Bowel: Jejunostomy tube in appropriate position. Evidence of free intraperitoneal air. No evidence of bowel obstruction, inflammatory process or abscess. Vascular/Lymphatic: No pathologically enlarged lymph nodes. No acute vascular findings. Reproductive: Uterus is  unremarkable. No adnexal masses identified. Small amount of free fluid in the dependent pelvis shows no significant change since previous study, and is most likely physiologic. Other:  None. Musculoskeletal:  No suspicious bone lesions identified. IMPRESSION: Jejunostomy tube in appropriate position. No evidence of bowel obstruction or other acute findings. Stable mild diffuse bladder wall thickening, suspicious for cystitis. Suggest correlation with urinalysis. Electronically Signed   By: Danae Orleans M.D.   On: 11/01/2022 19:01             LOS: 1 day    Time spent: 50 min    Sunnie Nielsen, DO [facility] 11/02/2022, 5:18 PM    Dictation software may have been used to generate the above note. Typos may occur and escape review in typed/dictated notes. Please contact Dr Lyn Hollingshead directly for clarity if needed.  Staff may message me via secure chat in Epic  but this may not receive an immediate response,  please page me for urgent matters!  If 7PM-7AM, please contact night coverage [email or web address]

## 2022-11-03 DIAGNOSIS — F32A Depression, unspecified: Secondary | ICD-10-CM

## 2022-11-03 DIAGNOSIS — E875 Hyperkalemia: Secondary | ICD-10-CM | POA: Diagnosis not present

## 2022-11-03 DIAGNOSIS — F332 Major depressive disorder, recurrent severe without psychotic features: Secondary | ICD-10-CM

## 2022-11-03 LAB — BASIC METABOLIC PANEL
Anion gap: 6 (ref 5–15)
BUN: 18 mg/dL (ref 6–20)
CO2: 21 mmol/L — ABNORMAL LOW (ref 22–32)
Calcium: 8 mg/dL — ABNORMAL LOW (ref 8.9–10.3)
Chloride: 113 mmol/L — ABNORMAL HIGH (ref 98–111)
Creatinine, Ser: 1.31 mg/dL — ABNORMAL HIGH (ref 0.44–1.00)
GFR, Estimated: 56 mL/min — ABNORMAL LOW (ref >60)
Glucose, Bld: 198 mg/dL — ABNORMAL HIGH (ref 70–99)
Potassium: 4.9 mmol/L (ref 3.5–5.1)
Sodium: 140 mmol/L (ref 135–145)

## 2022-11-03 LAB — GLUCOSE, CAPILLARY
Glucose-Capillary: 130 mg/dL — ABNORMAL HIGH (ref 70–99)
Glucose-Capillary: 169 mg/dL — ABNORMAL HIGH (ref 70–99)
Glucose-Capillary: 178 mg/dL — ABNORMAL HIGH (ref 70–99)
Glucose-Capillary: 255 mg/dL — ABNORMAL HIGH (ref 70–99)
Glucose-Capillary: 368 mg/dL — ABNORMAL HIGH (ref 70–99)

## 2022-11-03 LAB — URINE CULTURE: Culture: NO GROWTH

## 2022-11-03 LAB — MAGNESIUM
Magnesium: 2 mg/dL (ref 1.7–2.4)
Magnesium: 2.1 mg/dL (ref 1.7–2.4)

## 2022-11-03 LAB — PHOSPHORUS
Phosphorus: 2.8 mg/dL (ref 2.5–4.6)
Phosphorus: 1.4 mg/dL — ABNORMAL LOW (ref 2.5–4.6)

## 2022-11-03 MED ORDER — SODIUM CHLORIDE 0.9 % IV SOLN
12.5000 mg | Freq: Four times a day (QID) | INTRAVENOUS | Status: DC | PRN
  Administered 2022-11-04: 12.5 mg via INTRAVENOUS
  Filled 2022-11-03 (×2): qty 0.5

## 2022-11-03 MED ORDER — FLUOXETINE HCL 10 MG PO CAPS
10.0000 mg | ORAL_CAPSULE | Freq: Every day | ORAL | Status: DC
  Administered 2022-11-03 – 2022-11-15 (×13): 10 mg via ORAL
  Filled 2022-11-03 (×15): qty 1

## 2022-11-03 MED ORDER — ONDANSETRON 8 MG PO TBDP
8.0000 mg | ORAL_TABLET | Freq: Three times a day (TID) | ORAL | Status: DC | PRN
  Administered 2022-11-07: 8 mg via ORAL
  Filled 2022-11-03 (×2): qty 1
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
## 2022-11-03 NOTE — Plan of Care (Signed)

## 2022-11-03 NOTE — Discharge Instructions (Addendum)
Follow up with endocrinology for diabetes control.  Please follow up with your PCP in a week to recheck your labs including potassium  Transportation Agency Name: [facility] Address: [address] [place], [place] [address] Phone: [phone] Email: [email or web address] Website: [email or web address] Service(s) Offered: [employer], self-sufficiency, congregate meal  program, weatherization program, Field seismologist program, emergency food assistance,  housing counseling, home ownership program, wheels-towork program. September 16, 2016 22  Agency Name: [facility] [employer] [phone]) Address: [address], [place], [place] [address] Phone: [phone] Email:  Website: [email or web address] Service(s) Offered: Transportation for general public, subscription and demand  response; Dial-a-Ride for citizens 70 years of age or older. Agency Name: Department of Social Services Address: [address] [place], [place] [address] Phone: [phone] Service(s) Offered: Child support services; child welfare services; food stamps;  Medicaid; work first family assistance; and aid with fuel,  rent, food and medicine, transportation assistance.  Agency Name: [employer] ([employer]  Network Address: Phone: [phone] Service(s) Offered: Transports veterans to the [facility] medical center. Call  forty-eight hours in advance and leave the name, telephone  number, date, and time of appointment. Veteran will be  contacted by the driver the day before the appointment to  arrange a pick up point     Call Medicaid Plan [facility] to switch to Local Primary Care Provider Number to call  Medicaid plan is [id number] Also call the above number to arrange Medicaid transportation Please ask the plan if you are able to transport your child with you to appointments.    Gastroparesis Nutrition  Therapy  Gastroparesis means that your stomach empties very slowly. This happens when the nerves to your stomach are damaged or do not work properly. This can cause bloating, stomach discomfort or pain, feeling full after eating only a small amount of food, nausea, or vomiting. If you have diabetes in addition to gastroparesis, it is important to control your blood glucose. This will help the stomach empty.  Tips Following these tips may help your stomach empty faster:  Eat small, frequent meals (4 to 6 times per day).  Do not eat solid foods that are high in fat and do not add too much fat to foods. See the Foods Not Recommended table for foods that are high fat.  High-fat solid foods may delay the emptying of your stomach.  Liquids that contain fat, such as milkshakes, may be tolerated and can provide needed calories. Do not eat foods high in fiber. Do not take fiber supplements or fiber bulking agents for constipation.  Do not eat foods that increase acid reflux:  Acidic, spicy, fried and greasy foods  Caffeine  Mint Do not drink alcohol or smoke  Do not drink carbonated beverages, as they increase bloating.  Chew foods well before swallowing. Solid foods in the stomach do not empty well. If you have difficulty tolerating solid foods, ground foods may be better.  If symptoms are severe, semi-solid foods or liquids may need to be your main food sources. Choose liquid nutritional supplements that have less than or equal to 2 grams fiber per serving.  Sit upright while eating and sit upright or walk after meals. Do not lie down for 3 to 4 hours after eating to avoid reflux or regurgitation.  If you wish to nap during the day, nap first and then eat.  Drinking fluids at meals can take up room in your stomach, and you might not get enough calories. At  every meal, first eat a grain food and a protein food or dairy product if your body can tolerate it. Drink fluids with calories. It may be better  to delay fluids until after the meal and drink more between meals.   Foods Recommended Food Group Foods Recommended   Grains Choose grain foods with less than 2 grams of fiber per serving; these will be made with white flour Crackers: saltines or graham crackers Cold cereal: puffed rice Cream of rice or wheat Grits (fine ground) Gluten free low fiber foods Pretzels White bread, toasted White rice, cook until very soft  Protein Foods Lean meat and poultry: well-cooked, very tender, moist, and chopped fine Fish: tuna, salmon, or white fish Egg whites, scrambled Peanut butter (limit to 1 tablespoon at a time)  Dairy Milk*, drink 2% if tolerated to get more nutrients or lactose-free 2% milk Fortified non-dairy milks: almond, cashew, coconut, or rice (be aware that these options are not good sources of protein so you will need to eat an additional protein food) Fortified pea milk or soymilk (may cause gas and bloating for some) Instant breakfast* (pre-made lactose-free is sold in bottles) Milkshakes* (try blending in  to  cup canned fruit) Ice cream* (low-fat may be tolerated better; use in milkshakes to increase calories) Frozen yogurt Yogurt* Puddings and custard* Sherbet Liquid nutritional supplements with less than or equal to 2 grams fiber per 1 cup serving *Use lactose-free varieties to reduce gas and bloating  Vegetables Canned and well-cooked vegetables without seeds, skins or hulls Carrots, cooked Mashed potatoes (white, red or yellow) Sweet potato  Fruit Canned, soft and well-cooked fruits without seeds, skins or membranes Applesauce Banana, mashed may be tolerated better Diced peaches/pears fruit cups in juice Melon, very soft, cut into small pieces Fruit nectar juices  Oils When possible choose oils rather than solid fats Canola or olive oil Margarine  Other Clear soup Gelatin Popsicles   Foods Not Recommended Food Group Foods Not Recommended   Grains  Bran Grains foods with 2 or more grams of fiber per serving: barley, brown rice, kasha, quinoa Popcorn Whole grain and high-fiber cereals, including oats or granola Whole grain bread or pasta  Protein Foods Fried meats, poultry or fish Sausage, bacon or hot dogs Seafood Tough meat, meat with gristle: steak, roast beef or pork chops Beans, peas or lentils Nuts  Dairy Cheese slices Liquid nutritional supplements that have more than 2 grams fiber per serving Pea milk, soymilk (may increase gas and bloating)  Vegetables Raw or undercooked vegetables Alfalfa, asparagus, bean sprouts, broccoli, brussels sprouts, cabbage, cauliflower, corn, green peas or any other kind of peas, lima beans, mushrooms, okra, onions, parsnips, peppers, pickles, potato skins, or spinach  Fruit Fresh fruit except for the ones in the foods recommended table Acidic fruit and juices: oranges/orange juice, grapefruit/grapefruit juice, tomatoes/tomato juice Avocado Berries Coconut Dried fruit Fruit skin Mandarin oranges Pineapple  Oils Fried foods of any type  Other Coffee Olives or pickles Pizza Salsa Sushi   Gastroparesis Sample 1-Day Menu  Breakfast 1 slice white toast (1 carbohydrate serving)  1 teaspoon margarine, soft, tub   cup egg substitute  1 cup peach nectar (2 carbohydrate servings)  Morning Snack Smoothie made with:  small banana (1 carbohydrate serving)  1/3 cup [place] strawberry yogurt ( carbohydrate serving)  1 cup 2% milk (1 carbohydrate serving)  Lunch 2 ounces canned chicken  1 teaspoon mayonnaise  9 saltine crackers (1 carbohydrate servings)   cup applesauce (1  carbohydrate serving)  Afternoon Snack 1 slice white toast (1 carbohydrate serving)  1 tablespoon smooth peanut butter  Evening Meal 2 ounces baked fish   cup mashed potatoes (1 carbohydrate serving)  1 teaspoon olive oil  1 cup 2% milk (1 carbohydrate serving)  Evening Snack 1 packet instant breakfast (1 carbohydrate  servings)  1 cup 2% milk (1 carbohydrate serving)   Gastroparesis Vegetarian (Lacto-Ovo) Sample 1-Day Menu  Breakfast  cup cooked farina (1 carbohydrate serving)   cup egg substitute  2 teaspoons olive oil   cup peach nectar (2 carbohydrate servings)   cup 2% milk ( carbohydrate serving)  Morning Snack 1 slice white toast (1 carbohydrate serving)  1 tablespoon smooth peanut butter  Lunch  cup vegetable soup (1 carbohydrate serving)  9 saltine crackers (1 carbohydrate serving)   cup applesauce (1 carbohydrate serving)   cup 2% milk ( carbohydrate serving)  Afternoon Snack 6 ounces plain yogurt (1 carbohydrate serving)   small banana (1 carbohydrate servings)  Evening Meal  cup baked tofu  2/3 cup white rice (2 carbohydrate servings)  2 teaspoons olive oil   cup 2% milk ( carbohydrate serving)  Evening Snack 1 packet instant breakfast (1 carbohydrate servings)  1 cup 2% milk (1 carbohydrate serving)   Gastroparesis Vegan Sample 1-Day Menu  Breakfast  cup cooked farina (1 carbohydrate serving)  1/3 cup tofu scramble  2 teaspoons olive oil   cup peach nectar (2 carbohydrate servings)   cup almond milk fortified with calcium, vitamin B12, and vitamin D  Morning Snack 1 slice white toast (1 carbohydrate serving)  1 tablespoon smooth peanut butter  Lunch  cup vegetable soup (1 carbohydrate serving)  9 saltine crackers (1 carbohydrate serving)   cup applesauce (1 carbohydrate serving)  Afternoon Snack 6 ounces plain soy yogurt (1 carbohydrate servings)   small banana (1 carbohydrate serving)  Evening Meal  cup baked tofu  2/3 cup white rice (2 carbohydrate servings)  2 teaspoons olive oil   cup almond milk fortified with calcium, vitamin B12, and vitamin D  Evening Snack  scoop soy protein powder ( carbohydrate serving)   cup almond milk fortified with calcium, vitamin B12, and vitamin D  Copyright 2020  Academy of [employer]. All rights  reserved.

## 2022-11-03 NOTE — Progress Notes (Signed)
PROGRESS NOTE    Ashley Camacho   NWG:[id number] DOB: [birth year]  DOA: 11/01/2022 Date of Service: 11/03/22 PCP: Oneita Hurt, No     Brief Narrative / Hospital Course:  [name] is a 32 year old female with insulin-dependent diabetes mellitus type 1, severe gastroparesis, severe malnutrition, status post jejunostomy tube feed, adrenal insufficiency, CKD stage IIIb, hypertension, hypothyroid, who presents to the emergency department at the advice of general surgeon for left-sided abdominal pain, nausea, vomiting and dehydration. Per general surgeon, patient tries to use the jejunostomy tube but it hurts. 06/10: in ED, VSS, Serum sodium 133, potassium 5.9, chloride 102, bicarb 21, BUN of 22, serum creatinine of 1.71, EGFR 41, nonfasting blood glucose 156, WBC 4.2, hemoglobin 8.8, platelets of 152. Anion gap was not elevated at 10.  VBG was 7.27/51/less than 31. ED treatment: Insulin 5 units, morphine 4 mg IV one-time dose, sodium chloride 1 L bolus. Admitted to hospitalist service  06/11: General surgery to see - ok for slow Jtube feeds (CT scan as well as a Gastrografin contrast via the jejunostomy tube - no extravasation of contrast, no complication or abscess intra-abdominal) and recs for BH eval. Pt unable to take po at this time but still very labile Glc.  06/12: continuing tube feeds for now. BH to see.    Consultants:  General Surgery   Procedures: none      ASSESSMENT & PLAN:   Principal Problem:   Hyperkalemia Active Problems:   Gastroparesis   Adrenal insufficiency (HCC)   Hypothyroidism   Anxiety   Brittle diabetes (HCC)   Diabetic sensorimotor neuropathy (HCC)   Diabetic retinopathy (HCC)   CKD stage 3 due to type 1 diabetes mellitus (HCC)   Essential hypertension   Adrenal cortical hypofunction (HCC)   Protein-calorie malnutrition, severe (HCC)   CKD stage 3a, GFR 45-59 ml/min (HCC)   Elevated serum creatinine   Abdominal pain   Cystitis   Hyperkalemia  - resolved Monitor BMP  Elevated serum creatinine/AKI on CKD3a - improved/resolved  sodium chloride infusion at 150 mL/h, 1 day ordered Strict I's and O's  Protein-calorie malnutrition, severe (HCC) Concern for eating disorder / psychiatric component  Registered dietitian following Tube feeds ok to restart per surgery team Diet po as able  BH eval pending   Type 1 diabetes mellitus (HCC) labile Insulin long-acting, 2 units subcutaneous twice daily  Insulin SSI with at bedtime coverage ordered Goal inpatient blood glucose levels 140-180 Very labile Glc, chronic  Has been d/c from [place] [facility] d/t no-shows, but this was result of transportation issues outside her control, hopefully can get her set up w/ endocrinology again once [facility] ensures transportation plan   Abdominal pain Gastroparesis, chronic  General surgery following - no concerns at this time regarding jejunostomy tube Pain control   Essential hypertension Hydralazine 5 mg IV every 8 hours as needed for SBP greater 175  GAD, MDD  Patient states she did not have a refill for her antidepression/anxiety medication Ativan 0.5 mg IV every 6 hours as needed for anxiety Psychiatry consulted here to address anxiety/depression and concern for eating disorder   Hypothyroidism Levothyroxine 50 mcg daily before breakfast resumed  Adrenal insufficiency Patient had not taken her Cortef over the last 2 days PTA due to nausea Resumed home Cortef 20 mg in the morning and 10 mg at with supper  Cystitis / UTI ruled out  Ceftriaxone was given x2 doses pending urine culture --> UCx NG and no symptoms, will d/c abx  Difficult social situation Lives w/ father of her kids but he is not helpful, they are essentially living separately he is upstairs she is downstairs w/ kids. Young child learning to walk, grabs at her feeding tubes, difficult to administer her feeds at hom. Her mom helps some w/ transportation but limited ability to  help watch kids.  Help w/ transportation - TOC following and once we have plan for this will d/w endocrinology admin / patient advocate re: possible reestablish care  Resources for childcare if possible - appreciate TOC assistance     DVT prophylaxis: refusing SCD and lovenox/heparin  Pertinent IV fluids/nutrition: no continuous IV fluids, diabetic diet as tolerated, tube feeds as tolerated.  Central lines / invasive devices: J-tube  Code Status: FULL CODE  ACP documentation reviewed: 11/02/22 none on file   Current Admission Status: inpatient   TOC needs / Dispo plan: TBD, expect home to previous home environment +/- HH  Barriers to discharge / significant pending items: general surgery recs, possible surgical intervention for J-tube              Subjective / Brief ROS:  Patient reports still not able to eat, feels nauseous and dry heaves w/ po intake  Denies CP/SOB.  Pain controlled.  Denies new weakness.  Reports no concerns w/ urination - denies dysuria Alternates constipation/diarrhea but nothing concerning in past few days   Family Communication: none at this time     Objective Findings:  Vitals:   11/02/22 1629 11/02/22 2129 11/03/22 0536 11/03/22 0748  BP: 93/62 112/73 135/76 (!) 151/82  Pulse: 72 78 71 76  Resp: 16 18 18 16   Temp: (!) 97.5 F (36.4 C) (!) 97.2 F (36.2 C) 98.1 F (36.7 C) 97.6 F (36.4 C)  TempSrc:   Oral   SpO2: 100% 100% 100% 100%  Weight:      Height:        Intake/Output Summary (Last 24 hours) at 11/03/2022 1031 Last data filed at 11/03/2022 0444 Gross per 24 hour  Intake 813.33 ml  Output 550 ml  Net 263.33 ml   Filed Weights   11/01/22 1527  Weight: 40 kg    Examination:  Physical Exam Constitutional:      General: She is not in acute distress.    Appearance: She is ill-appearing.  Cardiovascular:     Rate and Rhythm: Normal rate and regular rhythm.  Pulmonary:     Effort: Pulmonary effort is normal.      Breath sounds: Normal breath sounds.  Abdominal:     General: Bowel sounds are normal. There is no distension.     Palpations: Abdomen is soft.     Tenderness: There is no abdominal tenderness (adjacent to Jtube). There is no guarding or rebound.  Musculoskeletal:     Right lower leg: No edema.     Left lower leg: No edema.  Neurological:     Mental Status: She is alert. Mental status is at baseline.  Psychiatric:        Mood and Affect: Mood normal.        Behavior: Behavior normal.          Scheduled Medications:   divalproex  250 mg Oral Daily   ferrous sulfate  325 mg Oral Q breakfast   free water  30 mL Per Tube Q4H   hydrocortisone  20 mg Oral Daily   And   hydrocortisone  10 mg Oral QHS   insulin aspart  0-5  Units Subcutaneous QHS   insulin aspart  0-9 Units Subcutaneous TID WC   insulin glargine-yfgn  2 Units Subcutaneous BID   levothyroxine  50 mcg Oral QAC breakfast   multivitamin with minerals  1 tablet Oral Daily   thiamine  100 mg Oral Daily    Continuous Infusions:  feeding supplement (GLUCERNA 1.5 CAL) 30 mL/hr at 11/03/22 1610   promethazine (PHENERGAN) injection (IM or IVPB)      PRN Medications:  acetaminophen **OR** acetaminophen, hydrALAZINE, LORazepam, morphine injection, promethazine (PHENERGAN) injection (IM or IVPB), senna-docusate  Antimicrobials from admission:  Anti-infectives (From admission, onward)    Start     Dose/Rate Route Frequency Ordered Stop   11/02/22 2000  cefTRIAXone (ROCEPHIN) 1 g in sodium chloride 0.9 % 100 mL IVPB  Status:  Discontinued        1 g 200 mL/hr over 30 Minutes Intravenous  Once 11/02/22 0837 11/02/22 0837   11/02/22 2000  cefTRIAXone (ROCEPHIN) 1 g in sodium chloride 0.9 % 100 mL IVPB  Status:  Discontinued        1 g 200 mL/hr over 30 Minutes Intravenous Every 24 hours 11/02/22 0837 11/03/22 1031   11/01/22 2100  cefTRIAXone (ROCEPHIN) 1 g in sodium chloride 0.9 % 100 mL IVPB        1 g 200 mL/hr over  30 Minutes Intravenous  Once 11/01/22 2054 11/01/22 2214           Data Reviewed:  I have personally reviewed the following...  CBC: Recent Labs  Lab 11/01/22 1532 11/02/22 0354  WBC 4.2 2.9*  HGB 8.8* 8.4*  HCT 26.5* 24.7*  MCV 88.3 87.9  PLT 152 127*   Basic Metabolic Panel: Recent Labs  Lab 11/01/22 1532 11/01/22 2121 11/02/22 0354 11/02/22 1806 11/03/22 0545  NA 133*  --  142  --  140  K 5.9*  --  4.7  --  4.9  CL 102  --  111  --  113*  CO2 21*  --  23  --  21*  GLUCOSE 456*  --  59*  --  198*  BUN 22*  --  20  --  18  CREATININE 1.71*  --  1.39*  --  1.31*  CALCIUM 8.9  --  8.1*  --  8.0*  MG 2.2 1.9  --  1.8 2.0  PHOS  --  2.4*  --  2.7 2.8   GFR: Estimated Creatinine Clearance: 39.3 mL/min (A) (by C-G formula based on SCr of 1.31 mg/dL (H)). Liver Function Tests: Recent Labs  Lab 11/01/22 1532  AST 18  ALT 8  ALKPHOS 64  BILITOT 0.9  PROT 7.1  ALBUMIN 4.3   No results for input(s): "LIPASE", "AMYLASE" in the last 168 hours. No results for input(s): "AMMONIA" in the last 168 hours. Coagulation Profile: No results for input(s): "INR", "PROTIME" in the last 168 hours. Cardiac Enzymes: No results for input(s): "CKTOTAL", "CKMB", "CKMBINDEX", "TROPONINI" in the last 168 hours. BNP (last 3 results) No results for input(s): "PROBNP" in the last 8760 hours. HbA1C: Recent Labs    11/02/22 0354  HGBA1C 9.4*   CBG: Recent Labs  Lab 11/02/22 1144 11/02/22 1717 11/02/22 2131 11/03/22 0454 11/03/22 0752  GLUCAP 212* 65* 116* 178* 169*   Lipid Profile: No results for input(s): "CHOL", "HDL", "LDLCALC", "TRIG", "CHOLHDL", "LDLDIRECT" in the last 72 hours. Thyroid Function Tests: No results for input(s): "TSH", "T4TOTAL", "FREET4", "T3FREE", "THYROIDAB" in the last 72 hours. Anemia  Panel: No results for input(s): "VITAMINB12", "FOLATE", "FERRITIN", "TIBC", "IRON", "RETICCTPCT" in the last 72 hours. Most Recent Urinalysis On File:      Component Value Date/Time   COLORURINE YELLOW (A) 11/02/2022 0418   APPEARANCEUR CLOUDY (A) 11/02/2022 0418   APPEARANCEUR Clear 09/18/2020 1153   LABSPEC 1.039 (H) 11/02/2022 0418   LABSPEC 1.026 03/01/2013 1844   PHURINE 5.0 11/02/2022 0418   GLUCOSEU 150 (A) 11/02/2022 0418   GLUCOSEU >=500 03/01/2013 1844   HGBUR NEGATIVE 11/02/2022 0418   BILIRUBINUR NEGATIVE 11/02/2022 0418   BILIRUBINUR neg 10/23/2020 0953   BILIRUBINUR Negative 09/18/2020 1153   BILIRUBINUR Negative 03/01/2013 1844   KETONESUR NEGATIVE 11/02/2022 0418   PROTEINUR NEGATIVE 11/02/2022 0418   UROBILINOGEN 0.2 10/23/2020 0953   NITRITE NEGATIVE 11/02/2022 0418   LEUKOCYTESUR LARGE (A) 11/02/2022 0418   LEUKOCYTESUR Negative 03/01/2013 1844   Sepsis Labs: @LABRCNTIP (procalcitonin:4,lacticidven:4) Microbiology: Recent Results (from the past 240 hour(s))  Urine Culture (for pregnant, neutropenic or urologic patients or patients with an indwelling urinary catheter)     Status: None   Collection Time: 11/02/22  4:18 AM   Specimen: Urine, Clean Catch  Result Value Ref Range Status   Specimen Description   Final    URINE, CLEAN CATCH Performed at [facility], [address] [place], [place] [address]    Special Requests   Final    NONE Performed at [facility], [address], [place], [place] [address]    Culture   Final    NO GROWTH Performed at [facility], [address], [place], [place] [address]    Report Status 11/03/2022 FINAL  Final      Radiology Studies last 3 days: DG ABDOMEN PEG TUBE LOCATION  Result Date: 11/02/2022 CLINICAL DATA:  [id number] Malnutrition (HCC) [id number] EXAM: ABDOMEN - 1 VIEW COMPARISON:  11/01/2022 CT FINDINGS: 30 mL of Gastrografin was injected into patient's existing percutaneous jejunostomy tube. Contrast is seen filling nondilated loops of proximal jejunum. No extraluminal contrast collections. Bowel gas pattern is nonobstructive. There is  excreted contrast within the urinary bladder. No gross free intraperitoneal air on supine view. IMPRESSION: Percutaneous jejunostomy tube appears appropriately positioned. Electronically Signed   By: Duanne Guess D.O.   On: 11/02/2022 11:02   CT ABDOMEN PELVIS W CONTRAST  Result Date: 11/01/2022 CLINICAL DATA:  Left-sided abdominal pain. Nausea and vomiting. Dehydration. Jejunostomy tube. 10 lb weight loss in past month. EXAM: CT ABDOMEN AND PELVIS WITH CONTRAST TECHNIQUE: Multidetector CT imaging of the abdomen and pelvis was performed using the standard protocol following bolus administration of intravenous contrast. RADIATION DOSE REDUCTION: This exam was performed according to the departmental dose-optimization program which includes automated exposure control, adjustment of the mA and/or kV according to patient size and/or use of iterative reconstruction technique. CONTRAST:  75mL OMNIPAQUE IOHEXOL 300 MG/ML  SOLN COMPARISON:  10/01/2022 FINDINGS: Lower Chest: No acute findings. Hepatobiliary: No hepatic masses identified. Gallbladder is unremarkable. No evidence of biliary ductal dilatation. Pancreas:  No mass or inflammatory changes. Spleen: Within normal limits in size and appearance. Adrenals/Urinary Tract: No suspicious masses identified. No evidence of ureteral calculi or hydronephrosis. Stable mild diffuse bladder wall thickening, suspicious for cystitis. Stomach/Bowel: Jejunostomy tube in appropriate position. Evidence of free intraperitoneal air. No evidence of bowel obstruction, inflammatory process or abscess. Vascular/Lymphatic: No pathologically enlarged lymph nodes. No acute vascular findings. Reproductive: Uterus is unremarkable. No adnexal masses identified. Small amount of free fluid in the dependent pelvis shows no  significant change since previous study, and is most likely physiologic. Other:  None. Musculoskeletal:  No suspicious bone lesions identified. IMPRESSION: Jejunostomy tube  in appropriate position. No evidence of bowel obstruction or other acute findings. Stable mild diffuse bladder wall thickening, suspicious for cystitis. Suggest correlation with urinalysis. Electronically Signed   By: Danae Orleans M.D.   On: 11/01/2022 19:01             LOS: 2 days    Time spent: 50 min    Sunnie Nielsen, DO [facility] 11/03/2022, 10:31 AM    Dictation software may have been used to generate the above note. Typos may occur and escape review in typed/dictated notes. Please contact Dr Lyn Hollingshead directly for clarity if needed.  Staff may message me via secure chat in Epic  but this may not receive an immediate response,  please page me for urgent matters!  If 7PM-7AM, please contact night coverage [email or web address]

## 2022-11-03 NOTE — TOC Transition Note (Signed)
Transition of Care [facility]) - CM/SW Discharge Note   Patient Details  Name: Ashley Camacho MRN: [id number] Date of Birth: 17-Jun-1990  Transition of Care [facility]) CM/SW Contact:  Garret Reddish, RN Phone Number: 11/03/2022, 3:30 PM   Clinical Narrative:  Chart reviewed.  Noted that patient was admitted with Hyperkalemia in the setting of   Insulin-dependent diabetes mellitus type 1, severe gastroparesis, severe malnutrition, status post jejunostomy tube feed.  I have spoken with Mrs. Ashley Camacho today.  She informs me that prior to admission she lived at home with her 2 daughters and their father.    She report prior to admission she was not able to  take a shower today because she was so weak.  She reported that she was having to take sponge baths.  She reports that she currently receives disability and does not work.  She reports that she receives tube feeding and flushes via mail for her tube feedings.  She states that prior to coming in the hospital she was using the tube feedings at night.  She reports that she uses [facility] for prescription medications.    Patient reports that her mother is able to take her to appointments as long as they are in [place].  She reports that she has a problem getting to appointments that are outside of [place].  She informed me that her Endocrinologist was at [facility] and she was not able to get to those appointments.  She reports that her Medicaid care reflects that her PCP is a person in [place] and she no longer lives in [place].  She reports that she has been trying to reach out to the [facility] worker to have her card changed to a provider in the [place] area .  She would prefer to got to [facility].  She also informs me that she has a one year old daughter that she has to take with her to her appointments.  She reports that reports that some appointments she is not able to take her child and she does not always have child care.    I have reached out to  [facility] and I was not able to reach anyone in the [facility] office.  I will continue in my efforts to speak with DSS worker.    I will provide Transportation resources on patient Discharge Instructions.    TOC will continue to follow for discharge planning.     Final next level of care:  (Pending Medical work up) Barriers to Discharge:  (Needs assistance with transportation for discharge appointments)   Patient Goals and CMS Choice      Discharge Placement                         Discharge Plan and Services Additional resources added to the After Visit Summary for     Discharge Planning Services: CM Consult                                 Social Determinants of Health (SDOH) Interventions SDOH Screenings   Food Insecurity: No Food Insecurity (11/01/2022)  Housing: Low Risk  (11/01/2022)  Transportation Needs: No Transportation Needs (11/01/2022)  Utilities: Not At Risk (11/01/2022)  Depression (PHQ2-9): Low Risk  (11/11/2020)  Tobacco Use: Low Risk  (11/01/2022)     Readmission Risk Interventions    11/03/2021   10:37 AM 06/16/2021  12:30 PM 06/03/2021    4:26 PM  Readmission Risk Prevention Plan  Transportation Screening Complete Complete Complete  Medication Review Oceanographer) Complete Complete Complete  PCP or Specialist appointment within 3-5 days of discharge Complete Complete Complete  HRI or Home Care Consult Not Complete  Not Complete  HRI or Home Care Consult Pt Refusal Comments NA  NA  SW Recovery Care/Counseling Consult Not Complete Complete Complete  SW Consult Not Complete Comments NA    Palliative Care Screening Not Applicable Not Applicable Not Applicable  Skilled Nursing Facility Not Applicable Not Applicable Not Applicable

## 2022-11-04 DIAGNOSIS — E109 Type 1 diabetes mellitus without complications: Secondary | ICD-10-CM | POA: Diagnosis not present

## 2022-11-04 DIAGNOSIS — N1832 Chronic kidney disease, stage 3b: Secondary | ICD-10-CM | POA: Diagnosis not present

## 2022-11-04 DIAGNOSIS — K3184 Gastroparesis: Secondary | ICD-10-CM | POA: Diagnosis not present

## 2022-11-04 DIAGNOSIS — E440 Moderate protein-calorie malnutrition: Secondary | ICD-10-CM | POA: Diagnosis not present

## 2022-11-04 DIAGNOSIS — Z931 Gastrostomy status: Secondary | ICD-10-CM | POA: Diagnosis not present

## 2022-11-04 DIAGNOSIS — E875 Hyperkalemia: Secondary | ICD-10-CM | POA: Diagnosis not present

## 2022-11-04 LAB — CBC
HCT: 24.6 % — ABNORMAL LOW (ref 36.0–46.0)
Hemoglobin: 8.4 g/dL — ABNORMAL LOW (ref 12.0–15.0)
MCH: 30.1 pg (ref 26.0–34.0)
MCHC: 34.1 g/dL (ref 30.0–36.0)
MCV: 88.2 fL (ref 80.0–100.0)
Platelets: 127 10*3/uL — ABNORMAL LOW (ref 150–400)
RBC: 2.79 MIL/uL — ABNORMAL LOW (ref 3.87–5.11)
RDW: 11.9 % (ref 11.5–15.5)
WBC: 3.6 10*3/uL — ABNORMAL LOW (ref 4.0–10.5)
nRBC: 0 % (ref 0.0–0.2)

## 2022-11-04 LAB — TSH: TSH: 0.719 u[IU]/mL (ref 0.350–4.500)

## 2022-11-04 LAB — GLUCOSE, CAPILLARY
Glucose-Capillary: 169 mg/dL — ABNORMAL HIGH (ref 70–99)
Glucose-Capillary: 286 mg/dL — ABNORMAL HIGH (ref 70–99)
Glucose-Capillary: 306 mg/dL — ABNORMAL HIGH (ref 70–99)
Glucose-Capillary: 166 mg/dL — ABNORMAL HIGH (ref 70–99)
Glucose-Capillary: 123 mg/dL — ABNORMAL HIGH (ref 70–99)
Glucose-Capillary: 267 mg/dL — ABNORMAL HIGH (ref 70–99)

## 2022-11-04 LAB — BASIC METABOLIC PANEL
Anion gap: 7 (ref 5–15)
BUN: 30 mg/dL — ABNORMAL HIGH (ref 6–20)
CO2: 24 mmol/L (ref 22–32)
Calcium: 8.7 mg/dL — ABNORMAL LOW (ref 8.9–10.3)
Chloride: 111 mmol/L (ref 98–111)
Creatinine, Ser: 1.39 mg/dL — ABNORMAL HIGH (ref 0.44–1.00)
GFR, Estimated: 52 mL/min — ABNORMAL LOW (ref >60)
Glucose, Bld: 230 mg/dL — ABNORMAL HIGH (ref 70–99)
Potassium: 5 mmol/L (ref 3.5–5.1)
Sodium: 142 mmol/L (ref 135–145)

## 2022-11-04 LAB — PHOSPHORUS: Phosphorus: 1.6 mg/dL — ABNORMAL LOW (ref 2.5–4.6)

## 2022-11-04 LAB — MAGNESIUM: Magnesium: 2.2 mg/dL (ref 1.7–2.4)

## 2022-11-04 MED ORDER — INSULIN ASPART 100 UNIT/ML IJ SOLN
2.0000 [IU] | INTRAMUSCULAR | Status: DC
  Administered 2022-11-04 – 2022-11-05 (×6): 2 [IU] via SUBCUTANEOUS
  Filled 2022-11-04 (×5): qty 1

## 2022-11-04 MED ORDER — LOPERAMIDE HCL 2 MG PO CAPS
4.0000 mg | ORAL_CAPSULE | ORAL | Status: DC | PRN
  Administered 2022-11-04 – 2022-11-14 (×8): 4 mg via ORAL
  Filled 2022-11-04 (×8): qty 2

## 2022-11-04 MED ORDER — SODIUM PHOSPHATES 45 MMOLE/15ML IV SOLN
12.0000 mmol | Freq: Once | INTRAVENOUS | Status: AC
  Administered 2022-11-04: 12 mmol via INTRAVENOUS
  Filled 2022-11-04: qty 4

## 2022-11-04 NOTE — Plan of Care (Signed)

## 2022-11-04 NOTE — Inpatient Diabetes Management (Addendum)
Inpatient Diabetes Program Recommendations  AACE/ADA: New Consensus Statement on Inpatient Glycemic Control (2015)  Target Ranges:  Prepandial:   less than 140 mg/dL      Peak postprandial:   less than 180 mg/dL (1-2 hours)      Critically ill patients:  140 - 180 mg/dL    Latest Reference Range & Units 11/03/22 23:51 11/04/22 03:44 11/04/22 07:49 11/04/22 11:32  Glucose-Capillary 70 - 99 mg/dL 161 (H) 096 (H) 045 (H)  5 units Novolog  2 units Semglee 306 (H)  (H): Data is abnormally high    Current Orders: Semglee 2 units BID  Novolog 0-9 units TID ac/hs    Cortef 20 mg AM/ 10 mg PM   Glucerna Tube Feeds 50cc/hr (rate increased last PM)    MD- Note pt getting Glucerna tube feeds 50cc/hr  Note rise in CBGs since 8am  May consider:  1. Increase the frequency of the Novolog SSI to Q4 hours while getting tube feeds  2. Start very low dose Novolog Tube feed coverage: Novolog 2 units Q4 hours (give with SSI dose) HOLD if tube feeds HELD for any reason     --Will follow patient during hospitalization--  Ambrose Finland RN, MSN, CDCES Diabetes Coordinator Inpatient Glycemic Control Team Team Pager: [phone] (8a-5p)
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
## 2022-11-04 NOTE — Progress Notes (Signed)
Nutrition Follow-up  DOCUMENTATION CODES:   Underweight, Severe malnutrition in context of social or environmental circumstances  INTERVENTION:   -Continue 100 mg thiamine daily -Continue MVI with minerals daily -Continue regular diet -Continue TF via j-tube:   Glucerna 1.5 @ 50 ml/hr   30 ml free water flush every 4 hours    Tube feeding regimen provides 1800 kcal (100% of needs), 99 grams of protein, and 911 ml of H2O.  Total free water: 969 ml daily   NUTRITION DIAGNOSIS:   Severe Malnutrition related to social / environmental circumstances as evidenced by moderate fat depletion, severe fat depletion, moderate muscle depletion, severe muscle depletion, percent weight loss.  Ongoing  GOAL:   Patient will meet greater than or equal to 90% of their needs  Progressing   MONITOR:   PO intake, Supplement acceptance, TF tolerance  REASON FOR ASSESSMENT:   Consult Assessment of nutrition requirement/status  ASSESSMENT:   Pt with insulin-dependent diabetes mellitus type 1, severe gastroparesis, severe malnutrition, status post jejunostomy tube feed, adrenal insufficiency, CKD stage IIIb, hypertension, hypothyroid, who presents for left-sided abdominal pain, nausea, vomiting and dehydration.  Reviewed I/O's: +62 ml x 24 hours and +1 L since admission  UOP: 750 ml x 24 hours  Case discussed with RN and MD. Pt not eating very well, but RN states she consumed a half of a tuna sandwich for lunch today. Pt is tolerating TF well. TF infusion via j-tube at goals rate of 50 ml/hr.   Noted phos is low. Pharmacy has been consulted for electrolyte management. Sodium phosphate has been ordered.   Noted wt gain since admission (suspect some might be related fluid gain- noted +1 L since admission).   Medications reviewed and include ferrous sulfate, thiamine, and phenergan.   Labs reviewed: K and Mg WDL. Phos: 1.6. CBGS: 166-306 (inpatient orders for glycemic control are 0-5 units  inuslin aspart daily at bedtime, 0-9 units inuslin aspart TID with meals, 2 units inuslin aspart every 4 hours, and 2 units inuslin glargine-yfgn BID).    Diet Order:   Diet Order             Diet regular Fluid consistency: Thin  Diet effective now                   EDUCATION NEEDS:   Education needs have been addressed  Skin:  Skin Assessment: Reviewed RN Assessment  Last BM:  11/04/22  Height:   Ht Readings from Last 1 Encounters:  11/01/22 5\' 1"  (1.549 m)    Weight:   Wt Readings from Last 1 Encounters:  11/04/22 44.7 kg    Ideal Body Weight:  47.7 kg  BMI:  Body mass index is 18.62 kg/m.  Estimated Nutritional Needs:   Kcal:  1600-1800  Protein:  85-100 grams  Fluid:  > 1.6 L    Levada Schilling, RD, LDN, CDCES Registered Dietitian II Certified Diabetes Care and Education Specialist Please refer to [facility] for RD and/or RD on-call/weekend/after hours pager

## 2022-11-04 NOTE — Plan of Care (Signed)
  Problem: Health Behavior/Discharge Planning: Goal: Ability to manage health-related needs will improve Outcome: Progressing   

## 2022-11-04 NOTE — TOC Progression Note (Signed)
Transition of Care [facility]) - Progression Note    Patient Details  Name: Ashley Camacho MRN: [id number] Date of Birth: July 11, 1990  Transition [facility]) CM/SW Contact  Garret Reddish, RN Phone Number: 11/04/2022, 3:48 PM  Clinical Narrative:   I have spoken with [facility] office.  The Medicaid team reports that patient will need to call the plan to change her provider to an area provider.  She will also have to call the Medicaid plan to arrange transportation to appointments.  The Medicaid team reported that patient has her Medicaid plan through [facility].  Patient would need to call [phone] to switch to a local PCP provider and arrange transportation services.  Patient would also need to ask the plan if she is able to take her child with her to appointments.  I attempted to speak to patient and make her aware of this information.  I will also place on the patient instructions.    TOC will continue to follow progress of patient.      Expected Discharge Plan:  (Pending Medical work up) Barriers to Discharge:  (Needs assistance with transportation for discharge appointments)  Expected Discharge Plan and Services   Discharge Planning Services: CM Consult                                           Social Determinants of Health (SDOH) Interventions SDOH Screenings   Food Insecurity: No Food Insecurity (11/01/2022)  Housing: Low Risk  (11/01/2022)  Transportation Needs: No Transportation Needs (11/01/2022)  Utilities: Not At Risk (11/01/2022)  Depression (PHQ2-9): Low Risk  (11/11/2020)  Tobacco Use: Low Risk  (11/01/2022)    Readmission Risk Interventions    11/03/2021   10:37 AM 06/16/2021   12:30 PM 06/03/2021    4:26 PM  Readmission Risk Prevention Plan  Transportation Screening Complete Complete Complete  Medication Review Oceanographer) Complete Complete Complete  PCP or Specialist appointment within 3-5 days of discharge Complete Complete  Complete  HRI or Home Care Consult Not Complete  Not Complete  HRI or Home Care Consult Pt Refusal Comments NA  NA  SW Recovery Care/Counseling Consult Not Complete Complete Complete  SW Consult Not Complete Comments NA    Palliative Care Screening Not Applicable Not Applicable Not Applicable  Skilled Nursing Facility Not Applicable Not Applicable Not Applicable

## 2022-11-04 NOTE — Consult Note (Signed)
PHARMACY CONSULT NOTE  Pharmacy Consult for Electrolyte Monitoring and Replacement   Recent Labs: Potassium (mmol/L)  Date Value  11/04/2022 5.0  03/01/2013 4.1   Magnesium (mg/dL)  Date Value  16/02/9603 2.2   Calcium (mg/dL)  Date Value  54/01/8118 8.7 (L)   Calcium, Total (mg/dL)  Date Value  14/78/2956 9.1   Albumin (g/dL)  Date Value  21/30/8657 4.3  03/01/2013 3.9   Phosphorus (mg/dL)  Date Value  84/69/6295 1.6 (L)   Sodium (mmol/L)  Date Value  11/04/2022 142  03/01/2013 133 (L)     Assessment: 32 yo female with PMH including T1DM, severe gastroparesis, malnutrition s/p J-tube placement, adrenal insufficiency, CKD, and HTN.  Presented to ED due to abdominal pain.  Surgical consult determined J-tube is functional and started slow tube feeding.  Goal of Therapy:  WNL  Plan:  Phosphorus 1.6, will order sodium phosphate 12 mmol IV x 1 Follow-up electrolytes with AM labs  Barrie Folk ,PharmD Clinical Pharmacist 11/04/2022 3:20 PM

## 2022-11-05 ENCOUNTER — Other Ambulatory Visit: Payer: Medicaid Other

## 2022-11-05 DIAGNOSIS — K3184 Gastroparesis: Secondary | ICD-10-CM | POA: Diagnosis not present

## 2022-11-05 DIAGNOSIS — E875 Hyperkalemia: Secondary | ICD-10-CM | POA: Diagnosis not present

## 2022-11-05 LAB — GLUCOSE, CAPILLARY
Glucose-Capillary: 203 mg/dL — ABNORMAL HIGH (ref 70–99)
Glucose-Capillary: 157 mg/dL — ABNORMAL HIGH (ref 70–99)
Glucose-Capillary: 235 mg/dL — ABNORMAL HIGH (ref 70–99)
Glucose-Capillary: 188 mg/dL — ABNORMAL HIGH (ref 70–99)
Glucose-Capillary: 154 mg/dL — ABNORMAL HIGH (ref 70–99)
Glucose-Capillary: 181 mg/dL — ABNORMAL HIGH (ref 70–99)

## 2022-11-05 LAB — BASIC METABOLIC PANEL
Anion gap: 8 (ref 5–15)
BUN: 41 mg/dL — ABNORMAL HIGH (ref 6–20)
CO2: 25 mmol/L (ref 22–32)
Calcium: 8.3 mg/dL — ABNORMAL LOW (ref 8.9–10.3)
Chloride: 113 mmol/L — ABNORMAL HIGH (ref 98–111)
Creatinine, Ser: 1.27 mg/dL — ABNORMAL HIGH (ref 0.44–1.00)
GFR, Estimated: 58 mL/min — ABNORMAL LOW (ref >60)
Glucose, Bld: 222 mg/dL — ABNORMAL HIGH (ref 70–99)
Potassium: 4.4 mmol/L (ref 3.5–5.1)
Sodium: 146 mmol/L — ABNORMAL HIGH (ref 135–145)

## 2022-11-05 LAB — MAGNESIUM: Magnesium: 2.1 mg/dL (ref 1.7–2.4)

## 2022-11-05 LAB — PHOSPHORUS: Phosphorus: 1.9 mg/dL — ABNORMAL LOW (ref 2.5–4.6)

## 2022-11-05 MED ORDER — INSULIN ASPART 100 UNIT/ML IJ SOLN
0.0000 [IU] | INTRAMUSCULAR | Status: DC
  Administered 2022-11-05 – 2022-11-06 (×4): 1 [IU] via SUBCUTANEOUS
  Administered 2022-11-06: 2 [IU] via SUBCUTANEOUS
  Administered 2022-11-07: 1 [IU] via SUBCUTANEOUS
  Administered 2022-11-07 (×2): 3 [IU] via SUBCUTANEOUS
  Administered 2022-11-07: 6 [IU] via SUBCUTANEOUS
  Administered 2022-11-08: 1 [IU] via SUBCUTANEOUS
  Administered 2022-11-08: 4 [IU] via SUBCUTANEOUS
  Administered 2022-11-08: 1 [IU] via SUBCUTANEOUS
  Administered 2022-11-09: 3 [IU] via SUBCUTANEOUS
  Administered 2022-11-09 (×2): 2 [IU] via SUBCUTANEOUS
  Administered 2022-11-10: 3 [IU] via SUBCUTANEOUS
  Administered 2022-11-10: 4 [IU] via SUBCUTANEOUS
  Administered 2022-11-10: 1 [IU] via SUBCUTANEOUS
  Administered 2022-11-11 (×3): 3 [IU] via SUBCUTANEOUS
  Administered 2022-11-12: 1 [IU] via SUBCUTANEOUS
  Administered 2022-11-12 (×2): 3 [IU] via SUBCUTANEOUS
  Administered 2022-11-12: 2 [IU] via SUBCUTANEOUS
  Administered 2022-11-12: 1 [IU] via SUBCUTANEOUS
  Administered 2022-11-13: 2 [IU] via SUBCUTANEOUS
  Administered 2022-11-13: 1 [IU] via SUBCUTANEOUS
  Filled 2022-11-05 (×28): qty 1

## 2022-11-05 MED ORDER — INSULIN ASPART 100 UNIT/ML IJ SOLN
2.0000 [IU] | INTRAMUSCULAR | Status: DC
  Administered 2022-11-05 – 2022-11-13 (×38): 2 [IU] via SUBCUTANEOUS
  Filled 2022-11-05 (×37): qty 1

## 2022-11-05 MED ORDER — POTASSIUM PHOSPHATES 15 MMOLE/5ML IV SOLN
9.0000 mmol | Freq: Once | INTRAVENOUS | Status: DC
  Filled 2022-11-05: qty 3

## 2022-11-05 MED ORDER — K PHOS MONO-SOD PHOS DI & MONO 155-852-130 MG PO TABS
500.0000 mg | ORAL_TABLET | ORAL | Status: AC
  Administered 2022-11-05 (×2): 500 mg via ORAL
  Filled 2022-11-05 (×2): qty 2

## 2022-11-05 NOTE — Plan of Care (Signed)
  Problem: Coping: Goal: Ability to adjust to condition or change in health will improve Outcome: Progressing   

## 2022-11-05 NOTE — Plan of Care (Signed)

## 2022-11-05 NOTE — Consult Note (Addendum)
PHARMACY CONSULT NOTE  Pharmacy Consult for Electrolyte Monitoring and Replacement   Recent Labs: Potassium (mmol/L)  Date Value  11/05/2022 4.4  03/01/2013 4.1   Magnesium (mg/dL)  Date Value  91/47/8295 2.1   Calcium (mg/dL)  Date Value  62/13/0865 8.3 (L)   Calcium, Total (mg/dL)  Date Value  78/46/9629 9.1   Albumin (g/dL)  Date Value  52/84/1324 4.3  03/01/2013 3.9   Phosphorus (mg/dL)  Date Value  40/02/2724 1.9 (L)   Sodium (mmol/L)  Date Value  11/05/2022 146 (H)  03/01/2013 133 (L)     Assessment: 32 yo female with PMH including T1DM, severe gastroparesis, malnutrition s/p J-tube placement, adrenal insufficiency, CKD, and HTN.  Presented to ED due to abdominal pain.  Surgical consult determined J-tube is functional and started slow tube feeding.  Goal of Therapy:  WNL  Plan:  Phosphorus 1.9, will order Kphos 500 mg po q4 hours x 2 Follow-up electrolytes with AM labs  Barrie Folk ,PharmD Clinical Pharmacist 11/05/2022 7:52 AM

## 2022-11-05 NOTE — Progress Notes (Signed)
PROGRESS NOTE    MELEANE DEOLIVEIRA   ZOX:[id number] DOB: 20-Apr-1991  DOA: 11/01/2022 Date of Service: 11/05/22 PCP: Oneita Hurt, No     Brief Narrative / Hospital Course:  Ms. Cilla Bobbitt is a 32 year old female with insulin-dependent diabetes mellitus type 1, severe gastroparesis, severe malnutrition, status post jejunostomy tube feed, adrenal insufficiency, CKD stage IIIb, hypertension, hypothyroid, who presents to the emergency department at the advice of general surgeon for left-sided abdominal pain, nausea, vomiting and dehydration. Per general surgeon, patient tries to use the jejunostomy tube but it hurts. 06/10: in ED, hyperkalmic in ED. CT no cause for abd pain noted. Admitted to hospitalist service w/ gen surg to consult.  06/11: General surgery to see - ok for slow Jtube feeds (CT scan as well as a Gastrografin contrast via the jejunostomy tube - no extravasation of contrast, no complication or abscess intra-abdominal) and recs for BH eval. Pt unable to take po at this time but still very labile Glc.  06/12: continuing tube feeds for now. BH to see - restart Prozac.  06/13: TOC following w/ DSS. Concern for DV, see RN note from this morning. Working on getting her some resources. Worse abdominal pain today, unable to take any po but tube feeds going ok.  06/14: better po intake. Tube feeds at 50 mL/h, will try increasing rate.    Consultants:  General Surgery   Procedures: none      ASSESSMENT & PLAN:   Principal Problem:   Hyperkalemia Active Problems:   Gastroparesis   Adrenal insufficiency (HCC)   Hypothyroidism   Anxiety   Brittle diabetes (HCC)   Diabetic sensorimotor neuropathy (HCC)   Diabetic retinopathy (HCC)   CKD stage 3 due to type 1 diabetes mellitus (HCC)   Essential hypertension   Adrenal cortical hypofunction (HCC)   Protein-calorie malnutrition, severe (HCC)   CKD stage 3a, GFR 45-59 ml/min (HCC)   Elevated serum creatinine   Abdominal pain    Cystitis   Hyperkalemia - resolved Monitor BMP  Elevated serum creatinine/AKI on CKD3a - improved/resolved  Strict I's and O's  Protein-calorie malnutrition, severe (HCC) Concern for eating disorder / psychiatric component  Registered dietitian following Tube feeds slowly titrating up  Diet po as able  BH eval done- treating anxiety/depression  Type 1 diabetes mellitus (HCC) labile Insulin long-acting, 2 units subcutaneous twice daily  Insulin SSI q4h for now given low po intake and tube feeds running  Goal inpatient blood glucose levels 140-180 Very labile Glc, chronic  Has been d/c from [place] [facility] d/t no-shows, but this was result of transportation issues outside her control, hopefully can get her set up w/ endocrinology again once [facility] ensures transportation plan   Abdominal pain Gastroparesis, chronic  General surgery consulted - no concerns at this time regarding jejunostomy tube Pain control   Essential hypertension Hydralazine 5 mg IV every 8 hours as needed for SBP greater 175  GAD, MDD  Patient states she did not have a refill for her antidepression/anxiety medication Ativan 0.5 mg IV every 6 hours as needed for anxiety Psychiatry consulted here to address anxiety/depression and concern for eating disorder - Prozac started   Hypothyroidism Levothyroxine 50 mcg daily before breakfast resumed  Adrenal insufficiency Patient had not taken her Cortef over the last 2 days PTA due to nausea Resumed home Cortef 20 mg in the morning and 10 mg at with supper  Cystitis / UTI ruled out  Ceftriaxone was given x2 doses pending urine culture -->  UCx NG and no symptoms, will d/c abx   Difficult social situation Lives w/ father of her kids but he is not helpful, they are essentially living separately he is upstairs she is downstairs w/ kids. Young child learning to walk, grabs at her feeding tubes, difficult to administer her feeds at hom. Her mom helps some w/  transportation but limited ability to help watch kids. Father of her kids and she do not have a good relationship, she reports he is using her disability funds for himself and has threatened to not let her move back in when she leaves the hospital.  Help w/ transportation - TOC following and once we have plan for this will d/w endocrinology admin / patient advocate re: possible reestablish care  Resources for childcare assistance if possible - appreciate TOC assistance w/ DSS      DVT prophylaxis: refusing SCD and lovenox/heparin  Pertinent IV fluids/nutrition: no continuous IV fluids, diabetic diet as tolerated, tube feeds as tolerated.  Central lines / invasive devices: J-tube  Code Status: FULL CODE  ACP documentation reviewed: 11/02/22 none on file   Current Admission Status: inpatient   TOC needs / Dispo plan: TBD, expect home to previous home environment +/- HH  Barriers to discharge / significant pending items: general surgery recs, possible surgical intervention for J-tube              Subjective / Brief ROS:  Patient reports not able to eat Denies CP/SOB.  Pain controlled.  Denies new weakness.  Reports loose stool   Family Communication: none at this time     Objective Findings:  Vitals:   11/04/22 1453 11/04/22 2057 11/05/22 0703 11/05/22 0811  BP: 116/80 138/74  134/72  Pulse: 95 86  80  Resp: 18 16  18   Temp: 97.7 F (36.5 C) 98.4 F (36.9 C)  98.2 F (36.8 C)  TempSrc:  Oral    SpO2: 100% 100%  100%  Weight:   47.6 kg   Height:        Intake/Output Summary (Last 24 hours) at 11/05/2022 1252 Last data filed at 11/05/2022 1046 Gross per 24 hour  Intake 2188.39 ml  Output --  Net 2188.39 ml   Filed Weights   11/01/22 1527 11/04/22 0440 11/05/22 0703  Weight: 40 kg 44.7 kg 47.6 kg    Examination:  Physical Exam Constitutional:      General: She is not in acute distress.    Appearance: She is ill-appearing.  Pulmonary:     Effort:  Pulmonary effort is normal.     Breath sounds: Normal breath sounds.  Abdominal:     Tenderness: There is no guarding or rebound.  Neurological:     Mental Status: She is alert. Mental status is at baseline.  Psychiatric:        Mood and Affect: Mood normal.        Behavior: Behavior normal.          Scheduled Medications:   divalproex  250 mg Oral Daily   ferrous sulfate  325 mg Oral Q breakfast   FLUoxetine  10 mg Oral Daily   free water  30 mL Per Tube Q4H   hydrocortisone  20 mg Oral Daily   And   hydrocortisone  10 mg Oral QHS   insulin aspart  0-6 Units Subcutaneous Q4H   insulin aspart  2 Units Subcutaneous Q4H   insulin glargine-yfgn  2 Units Subcutaneous BID   levothyroxine  50  mcg Oral QAC breakfast   multivitamin with minerals  1 tablet Oral Daily   thiamine  100 mg Oral Daily    Continuous Infusions:  feeding supplement (GLUCERNA 1.5 CAL) 1,000 mL (11/04/22 2121)   promethazine (PHENERGAN) injection (IM or IVPB) Stopped (11/04/22 0757)    PRN Medications:  acetaminophen **OR** acetaminophen, hydrALAZINE, loperamide, LORazepam, morphine injection, ondansetron, promethazine (PHENERGAN) injection (IM or IVPB), senna-docusate  Antimicrobials from admission:  Anti-infectives (From admission, onward)    Start     Dose/Rate Route Frequency Ordered Stop   11/02/22 2000  cefTRIAXone (ROCEPHIN) 1 g in sodium chloride 0.9 % 100 mL IVPB  Status:  Discontinued        1 g 200 mL/hr over 30 Minutes Intravenous  Once 11/02/22 0837 11/02/22 0837   11/02/22 2000  cefTRIAXone (ROCEPHIN) 1 g in sodium chloride 0.9 % 100 mL IVPB  Status:  Discontinued        1 g 200 mL/hr over 30 Minutes Intravenous Every 24 hours 11/02/22 0837 11/03/22 1031   11/01/22 2100  cefTRIAXone (ROCEPHIN) 1 g in sodium chloride 0.9 % 100 mL IVPB        1 g 200 mL/hr over 30 Minutes Intravenous  Once 11/01/22 2054 11/01/22 2214           Data Reviewed:  I have personally reviewed the  following...  CBC: Recent Labs  Lab 11/01/22 1532 11/02/22 0354 11/04/22 0523  WBC 4.2 2.9* 3.6*  HGB 8.8* 8.4* 8.4*  HCT 26.5* 24.7* 24.6*  MCV 88.3 87.9 88.2  PLT 152 127* 127*   Basic Metabolic Panel: Recent Labs  Lab 11/01/22 1532 11/01/22 2121 11/02/22 0354 11/02/22 1806 11/03/22 0545 11/03/22 1655 11/04/22 0523 11/05/22 0323  NA 133*  --  142  --  140  --  142 146*  K 5.9*  --  4.7  --  4.9  --  5.0 4.4  CL 102  --  111  --  113*  --  111 113*  CO2 21*  --  23  --  21*  --  24 25  GLUCOSE 456*  --  59*  --  198*  --  230* 222*  BUN 22*  --  20  --  18  --  30* 41*  CREATININE 1.71*  --  1.39*  --  1.31*  --  1.39* 1.27*  CALCIUM 8.9  --  8.1*  --  8.0*  --  8.7* 8.3*  MG 2.2   < >  --  1.8 2.0 2.1 2.2 2.1  PHOS  --    < >  --  2.7 2.8 1.4* 1.6* 1.9*   < > = values in this interval not displayed.   GFR: Estimated Creatinine Clearance: 48.2 mL/min (A) (by C-G formula based on SCr of 1.27 mg/dL (H)). Liver Function Tests: Recent Labs  Lab 11/01/22 1532  AST 18  ALT 8  ALKPHOS 64  BILITOT 0.9  PROT 7.1  ALBUMIN 4.3   No results for input(s): "LIPASE", "AMYLASE" in the last 168 hours. No results for input(s): "AMMONIA" in the last 168 hours. Coagulation Profile: No results for input(s): "INR", "PROTIME" in the last 168 hours. Cardiac Enzymes: No results for input(s): "CKTOTAL", "CKMB", "CKMBINDEX", "TROPONINI" in the last 168 hours. BNP (last 3 results) No results for input(s): "PROBNP" in the last 8760 hours. HbA1C: No results for input(s): "HGBA1C" in the last 72 hours.  CBG: Recent Labs  Lab 11/04/22 2105 11/05/22 0019  11/05/22 0246 11/05/22 0608 11/05/22 1144  GLUCAP 267* 235* 203* 157* 188*   Lipid Profile: No results for input(s): "CHOL", "HDL", "LDLCALC", "TRIG", "CHOLHDL", "LDLDIRECT" in the last 72 hours. Thyroid Function Tests: Recent Labs    11/04/22 0523  TSH 0.719   Anemia Panel: No results for input(s): "VITAMINB12",  "FOLATE", "FERRITIN", "TIBC", "IRON", "RETICCTPCT" in the last 72 hours. Most Recent Urinalysis On File:     Component Value Date/Time   COLORURINE YELLOW (A) 11/02/2022 0418   APPEARANCEUR CLOUDY (A) 11/02/2022 0418   APPEARANCEUR Clear 09/18/2020 1153   LABSPEC 1.039 (H) 11/02/2022 0418   LABSPEC 1.026 03/01/2013 1844   PHURINE 5.0 11/02/2022 0418   GLUCOSEU 150 (A) 11/02/2022 0418   GLUCOSEU >=500 03/01/2013 1844   HGBUR NEGATIVE 11/02/2022 0418   BILIRUBINUR NEGATIVE 11/02/2022 0418   BILIRUBINUR neg 10/23/2020 0953   BILIRUBINUR Negative 09/18/2020 1153   BILIRUBINUR Negative 03/01/2013 1844   KETONESUR NEGATIVE 11/02/2022 0418   PROTEINUR NEGATIVE 11/02/2022 0418   UROBILINOGEN 0.2 10/23/2020 0953   NITRITE NEGATIVE 11/02/2022 0418   LEUKOCYTESUR LARGE (A) 11/02/2022 0418   LEUKOCYTESUR Negative 03/01/2013 1844   Sepsis Labs: @LABRCNTIP (procalcitonin:4,lacticidven:4) Microbiology: Recent Results (from the past 240 hour(s))  Urine Culture (for pregnant, neutropenic or urologic patients or patients with an indwelling urinary catheter)     Status: None   Collection Time: 11/02/22  4:18 AM   Specimen: Urine, Clean Catch  Result Value Ref Range Status   Specimen Description   Final    URINE, CLEAN CATCH Performed at [facility], [address] [place], [place] [address]    Special Requests   Final    NONE Performed at [facility], [address], [place], [place] [address]    Culture   Final    NO GROWTH Performed at [facility], [address], [place], [place] [address]    Report Status 11/03/2022 FINAL  Final      Radiology Studies last 3 days: DG ABDOMEN PEG TUBE LOCATION  Result Date: 11/02/2022 CLINICAL DATA:  [id number] Malnutrition (HCC) [id number] EXAM: ABDOMEN - 1 VIEW COMPARISON:  11/01/2022 CT FINDINGS: 30 mL of Gastrografin was injected into patient's existing percutaneous jejunostomy tube. Contrast is seen filling nondilated  loops of proximal jejunum. No extraluminal contrast collections. Bowel gas pattern is nonobstructive. There is excreted contrast within the urinary bladder. No gross free intraperitoneal air on supine view. IMPRESSION: Percutaneous jejunostomy tube appears appropriately positioned. Electronically Signed   By: Duanne Guess D.O.   On: 11/02/2022 11:02   CT ABDOMEN PELVIS W CONTRAST  Result Date: 11/01/2022 CLINICAL DATA:  Left-sided abdominal pain. Nausea and vomiting. Dehydration. Jejunostomy tube. 10 lb weight loss in past month. EXAM: CT ABDOMEN AND PELVIS WITH CONTRAST TECHNIQUE: Multidetector CT imaging of the abdomen and pelvis was performed using the standard protocol following bolus administration of intravenous contrast. RADIATION DOSE REDUCTION: This exam was performed according to the departmental dose-optimization program which includes automated exposure control, adjustment of the mA and/or kV according to patient size and/or use of iterative reconstruction technique. CONTRAST:  75mL OMNIPAQUE IOHEXOL 300 MG/ML  SOLN COMPARISON:  10/01/2022 FINDINGS: Lower Chest: No acute findings. Hepatobiliary: No hepatic masses identified. Gallbladder is unremarkable. No evidence of biliary ductal dilatation. Pancreas:  No mass or inflammatory changes. Spleen: Within normal limits in size and appearance. Adrenals/Urinary Tract: No suspicious masses identified. No evidence of ureteral calculi or hydronephrosis. Stable mild diffuse bladder wall thickening, suspicious for cystitis. Stomach/Bowel:  Jejunostomy tube in appropriate position. Evidence of free intraperitoneal air. No evidence of bowel obstruction, inflammatory process or abscess. Vascular/Lymphatic: No pathologically enlarged lymph nodes. No acute vascular findings. Reproductive: Uterus is unremarkable. No adnexal masses identified. Small amount of free fluid in the dependent pelvis shows no significant change since previous study, and is most likely  physiologic. Other:  None. Musculoskeletal:  No suspicious bone lesions identified. IMPRESSION: Jejunostomy tube in appropriate position. No evidence of bowel obstruction or other acute findings. Stable mild diffuse bladder wall thickening, suspicious for cystitis. Suggest correlation with urinalysis. Electronically Signed   By: Danae Orleans M.D.   On: 11/01/2022 19:01             LOS: 4 days    Time spent: 50 min    Sunnie Nielsen, DO [facility] 11/05/2022, 12:52 PM    Dictation software may have been used to generate the above note. Typos may occur and escape review in typed/dictated notes. Please contact Dr Lyn Hollingshead directly for clarity if needed.  Staff may message me via secure chat in Epic  but this may not receive an immediate response,  please page me for urgent matters!  If 7PM-7AM, please contact night coverage [email or web address]

## 2022-11-06 DIAGNOSIS — K3184 Gastroparesis: Secondary | ICD-10-CM | POA: Diagnosis not present

## 2022-11-06 LAB — COMPREHENSIVE METABOLIC PANEL
ALT: 10 U/L (ref 0–44)
AST: 20 U/L (ref 15–41)
Albumin: 3.1 g/dL — ABNORMAL LOW (ref 3.5–5.0)
Alkaline Phosphatase: 50 U/L (ref 38–126)
Anion gap: 8 (ref 5–15)
BUN: 39 mg/dL — ABNORMAL HIGH (ref 6–20)
CO2: 27 mmol/L (ref 22–32)
Calcium: 7.5 mg/dL — ABNORMAL LOW (ref 8.9–10.3)
Chloride: 110 mmol/L (ref 98–111)
Creatinine, Ser: 1.22 mg/dL — ABNORMAL HIGH (ref 0.44–1.00)
GFR, Estimated: >60 mL/min (ref >60)
Glucose, Bld: 129 mg/dL — ABNORMAL HIGH (ref 70–99)
Potassium: 4 mmol/L (ref 3.5–5.1)
Sodium: 145 mmol/L (ref 135–145)
Total Bilirubin: 0.4 mg/dL (ref 0.3–1.2)
Total Protein: 5.3 g/dL — ABNORMAL LOW (ref 6.5–8.1)

## 2022-11-06 LAB — GLUCOSE, CAPILLARY
Glucose-Capillary: 120 mg/dL — ABNORMAL HIGH (ref 70–99)
Glucose-Capillary: 123 mg/dL — ABNORMAL HIGH (ref 70–99)
Glucose-Capillary: 182 mg/dL — ABNORMAL HIGH (ref 70–99)
Glucose-Capillary: 195 mg/dL — ABNORMAL HIGH (ref 70–99)
Glucose-Capillary: 246 mg/dL — ABNORMAL HIGH (ref 70–99)
Glucose-Capillary: 91 mg/dL (ref 70–99)

## 2022-11-06 LAB — PHOSPHORUS: Phosphorus: 2.4 mg/dL — ABNORMAL LOW (ref 2.5–4.6)

## 2022-11-06 LAB — MAGNESIUM: Magnesium: 2 mg/dL (ref 1.7–2.4)

## 2022-11-06 MED ORDER — GLUCERNA 1.5 CAL PO LIQD: 1000.0000 mL | ORAL | Status: DC

## 2022-11-06 MED ORDER — GLUCERNA 1.5 CAL PO LIQD
1000.0000 mL | ORAL | Status: DC
  Administered 2022-11-06 – 2022-11-08 (×4): 1000 mL

## 2022-11-06 MED ORDER — DICYCLOMINE HCL 20 MG PO TABS
20.0000 mg | ORAL_TABLET | Freq: Three times a day (TID) | ORAL | Status: DC | PRN
  Administered 2022-11-06 – 2022-11-09 (×5): 20 mg via ORAL
  Filled 2022-11-06 (×7): qty 1

## 2022-11-06 MED ORDER — K PHOS MONO-SOD PHOS DI & MONO 155-852-130 MG PO TABS
500.0000 mg | ORAL_TABLET | ORAL | Status: AC
  Administered 2022-11-06 (×2): 500 mg via ORAL
  Filled 2022-11-06 (×2): qty 2

## 2022-11-06 NOTE — Consult Note (Signed)
PHARMACY CONSULT NOTE  Pharmacy Consult for Electrolyte Monitoring and Replacement   Recent Labs: Potassium (mmol/L)  Date Value  11/06/2022 4.0  03/01/2013 4.1   Magnesium (mg/dL)  Date Value  16/02/9603 2.0   Calcium (mg/dL)  Date Value  54/01/8118 7.5 (L)   Calcium, Total (mg/dL)  Date Value  14/78/2956 9.1   Albumin (g/dL)  Date Value  21/30/8657 3.1 (L)  03/01/2013 3.9   Phosphorus (mg/dL)  Date Value  84/69/6295 2.4 (L)   Sodium (mmol/L)  Date Value  11/06/2022 145  03/01/2013 133 (L)     Assessment: 32 yo female with PMH including T1DM, severe gastroparesis, malnutrition s/p J-tube placement, adrenal insufficiency, CKD, and HTN.  Presented to ED due to abdominal pain.  Surgical consult determined J-tube is functional and started slow tube feeding.  Goal of Therapy:  WNL  Plan:  Phosphorus 2.4, will order Kphos 500 mg po q4 hours x 2 Follow-up electrolytes with AM labs  Angelique Blonder ,PharmD Clinical Pharmacist 11/06/2022 8:30 AM

## 2022-11-06 NOTE — Progress Notes (Signed)
PROGRESS NOTE    STACEYANN BORRESON   ZOX:[id number] DOB: 12/08/1990  DOA: 11/01/2022 Date of Service: 11/06/22 PCP: Oneita Hurt, No     Brief Narrative / Hospital Course:  Ms. Maciel Langlois is a 32 year old female with insulin-dependent diabetes mellitus type 1, severe gastroparesis, severe malnutrition, status post jejunostomy tube feed, adrenal insufficiency, CKD stage IIIb, hypertension, hypothyroid, who presents to the emergency department at the advice of general surgeon for left-sided abdominal pain, nausea, vomiting and dehydration. Per general surgeon, patient tries to use the jejunostomy tube but it hurts. 06/10: in ED, hyperkalmic in ED. CT no cause for abd pain noted. Admitted to hospitalist service w/ gen surg to consult.  06/11: General surgery to see - ok for slow Jtube feeds (CT scan as well as a Gastrografin contrast via the jejunostomy tube - no extravasation of contrast, no complication or abscess intra-abdominal) and recs for BH eval. Pt unable to take po at this time but still very labile Glc.  06/12: continuing tube feeds for now. BH to see - restart Prozac.  06/13: TOC following w/ DSS. Concern for DV, see RN note from this morning. Working on getting her some resources. Worse abdominal pain today, unable to take any po but tube feeds going ok.  06/14-06/15: better po intake. Tube feeds at 50 mL/h, will try increasing rate.    Consultants:  General Surgery   Procedures: none      ASSESSMENT & PLAN:   Principal Problem:   Hyperkalemia Active Problems:   Gastroparesis   Adrenal insufficiency (HCC)   Hypothyroidism   Anxiety   Brittle diabetes (HCC)   Diabetic sensorimotor neuropathy (HCC)   Diabetic retinopathy (HCC)   CKD stage 3 due to type 1 diabetes mellitus (HCC)   Essential hypertension   Adrenal cortical hypofunction (HCC)   Protein-calorie malnutrition, severe (HCC)   CKD stage 3a, GFR 45-59 ml/min (HCC)   Elevated serum creatinine   Abdominal  pain   Cystitis   Hyperkalemia - resolved Monitor BMP  Elevated serum creatinine/AKI on CKD3a - improved/resolved  Strict I's and O's  Protein-calorie malnutrition, severe (HCC) Concern for eating disorder / psychiatric component  Registered dietitian following Tube feeds slowly titrating up  Diet po as able  BH eval done- treating anxiety/depression  Type 1 diabetes mellitus (HCC) labile Insulin long-acting Insulin SSI q4h for now given low po intake and tube feeds running  Goal inpatient blood glucose levels 140-180 Very labile Glc, chronic  Has been d/c from [facility] d/t no-shows, but this was result of transportation issues outside her control, hopefully can get her set up w/ endocrinology again once [facility] ensures transportation plan   Abdominal pain Gastroparesis, chronic  General surgery consulted - no concerns at this time regarding jejunostomy tube Pain control   Essential hypertension Hydralazine 5 mg IV every 8 hours as needed for SBP greater 175  GAD, MDD  Patient states she did not have a refill for her antidepression/anxiety medication Ativan 0.5 mg IV every 6 hours as needed for anxiety Psychiatry consulted here to address anxiety/depression and concern for eating disorder - Prozac started   Hypothyroidism Levothyroxine 50 mcg daily before breakfast resumed  Adrenal insufficiency Patient had not taken her Cortef over the last 2 days PTA due to nausea Resumed home Cortef 20 mg in the morning and 10 mg at with supper  Cystitis / UTI ruled out  Ceftriaxone was given x2 doses pending urine culture --> UCx NG and no symptoms, will  d/c abx   Difficult social situation Lives w/ father of her kids but he is not helpful, they are essentially living separately he is upstairs she is downstairs w/ kids. Young child learning to walk, grabs at her feeding tubes, difficult to administer her feeds at hom. Her mom helps some w/ transportation but limited ability  to help watch kids. Father of her kids and she do not have a good relationship, she reports he is using her disability funds for himself and has threatened to not let her move back in when she leaves the hospital.  Help w/ transportation - TOC following and once we have plan for this will d/w endocrinology admin / patient advocate re: possible reestablish care  Resources for childcare assistance if possible - appreciate TOC assistance w/ DSS      DVT prophylaxis: refusing SCD and lovenox/heparin  Pertinent IV fluids/nutrition: no continuous IV fluids, diabetic diet as tolerated, tube feeds as tolerated.  Central lines / invasive devices: J-tube  Code Status: FULL CODE  ACP documentation reviewed: 11/02/22 none on file   Current Admission Status: inpatient   TOC needs / Dispo plan: TBD, expect home to previous home environment +/- HH  Barriers to discharge / significant pending items: general surgery recs, possible surgical intervention for J-tube              Subjective / Brief ROS:  Patient reports po intake better Abdominal cramping today  Denies CP/SOB.  Pain controlled.  Denies new weakness.  Reports loose stool   Family Communication: none at this time     Objective Findings:  Vitals:   11/05/22 0811 11/05/22 1516 11/06/22 0002 11/06/22 0813  BP: 134/72 (!) 146/86 124/72 117/73  Pulse: 80 78 80 87  Resp: 18 16 18 16   Temp: 98.2 F (36.8 C) 97.8 F (36.6 C) 98 F (36.7 C) 98.6 F (37 C)  TempSrc:      SpO2: 100% 100% 100% 100%  Weight:      Height:        Intake/Output Summary (Last 24 hours) at 11/06/2022 1246 Last data filed at 11/06/2022 1005 Gross per 24 hour  Intake 1949.34 ml  Output --  Net 1949.34 ml   Filed Weights   11/01/22 1527 11/04/22 0440 11/05/22 0703  Weight: 40 kg 44.7 kg 47.6 kg    Examination:  Physical Exam Constitutional:      General: She is not in acute distress.    Appearance: She is ill-appearing.  Pulmonary:      Effort: Pulmonary effort is normal.     Breath sounds: Normal breath sounds.  Abdominal:     General: Abdomen is flat. Bowel sounds are increased.     Tenderness: There is no guarding or rebound.  Neurological:     Mental Status: She is alert. Mental status is at baseline.  Psychiatric:        Mood and Affect: Mood normal.        Behavior: Behavior normal.          Scheduled Medications:   divalproex  250 mg Oral Daily   ferrous sulfate  325 mg Oral Q breakfast   FLUoxetine  10 mg Oral Daily   free water  30 mL Per Tube Q4H   hydrocortisone  20 mg Oral Daily   And   hydrocortisone  10 mg Oral QHS   insulin aspart  0-6 Units Subcutaneous Q4H   insulin aspart  2 Units Subcutaneous Q4H  insulin glargine-yfgn  2 Units Subcutaneous BID   levothyroxine  50 mcg Oral QAC breakfast   multivitamin with minerals  1 tablet Oral Daily   phosphorus  500 mg Oral Q4H   thiamine  100 mg Oral Daily    Continuous Infusions:  feeding supplement (GLUCERNA 1.5 CAL) 1,000 mL (11/04/22 2121)   promethazine (PHENERGAN) injection (IM or IVPB) Stopped (11/04/22 0757)    PRN Medications:  acetaminophen **OR** acetaminophen, dicyclomine, loperamide, LORazepam, morphine injection, ondansetron, promethazine (PHENERGAN) injection (IM or IVPB), senna-docusate  Antimicrobials from admission:  Anti-infectives (From admission, onward)    Start     Dose/Rate Route Frequency Ordered Stop   11/02/22 2000  cefTRIAXone (ROCEPHIN) 1 g in sodium chloride 0.9 % 100 mL IVPB  Status:  Discontinued        1 g 200 mL/hr over 30 Minutes Intravenous  Once 11/02/22 0837 11/02/22 0837   11/02/22 2000  cefTRIAXone (ROCEPHIN) 1 g in sodium chloride 0.9 % 100 mL IVPB  Status:  Discontinued        1 g 200 mL/hr over 30 Minutes Intravenous Every 24 hours 11/02/22 0837 11/03/22 1031   11/01/22 2100  cefTRIAXone (ROCEPHIN) 1 g in sodium chloride 0.9 % 100 mL IVPB        1 g 200 mL/hr over 30 Minutes Intravenous  Once  11/01/22 2054 11/01/22 2214           Data Reviewed:  I have personally reviewed the following...  CBC: Recent Labs  Lab 11/01/22 1532 11/02/22 0354 11/04/22 0523  WBC 4.2 2.9* 3.6*  HGB 8.8* 8.4* 8.4*  HCT 26.5* 24.7* 24.6*  MCV 88.3 87.9 88.2  PLT 152 127* 127*   Basic Metabolic Panel: Recent Labs  Lab 11/02/22 0354 11/02/22 1806 11/03/22 0545 11/03/22 1655 11/04/22 0523 11/05/22 0323 11/06/22 0422  NA 142  --  140  --  142 146* 145  K 4.7  --  4.9  --  5.0 4.4 4.0  CL 111  --  113*  --  111 113* 110  CO2 23  --  21*  --  24 25 27   GLUCOSE 59*  --  198*  --  230* 222* 129*  BUN 20  --  18  --  30* 41* 39*  CREATININE 1.39*  --  1.31*  --  1.39* 1.27* 1.22*  CALCIUM 8.1*  --  8.0*  --  8.7* 8.3* 7.5*  MG  --    < > 2.0 2.1 2.2 2.1 2.0  PHOS  --    < > 2.8 1.4* 1.6* 1.9* 2.4*   < > = values in this interval not displayed.   GFR: Estimated Creatinine Clearance: 50.2 mL/min (A) (by C-G formula based on SCr of 1.22 mg/dL (H)). Liver Function Tests: Recent Labs  Lab 11/01/22 1532 11/06/22 0422  AST 18 20  ALT 8 10  ALKPHOS 64 50  BILITOT 0.9 0.4  PROT 7.1 5.3*  ALBUMIN 4.3 3.1*   No results for input(s): "LIPASE", "AMYLASE" in the last 168 hours. No results for input(s): "AMMONIA" in the last 168 hours. Coagulation Profile: No results for input(s): "INR", "PROTIME" in the last 168 hours. Cardiac Enzymes: No results for input(s): "CKTOTAL", "CKMB", "CKMBINDEX", "TROPONINI" in the last 168 hours. BNP (last 3 results) No results for input(s): "PROBNP" in the last 8760 hours. HbA1C: No results for input(s): "HGBA1C" in the last 72 hours.  CBG: Recent Labs  Lab 11/05/22 2124 11/05/22 2359 11/06/22 0453  11/06/22 0807 11/06/22 1232  GLUCAP 181* 120* 123* 91 195*   Lipid Profile: No results for input(s): "CHOL", "HDL", "LDLCALC", "TRIG", "CHOLHDL", "LDLDIRECT" in the last 72 hours. Thyroid Function Tests: Recent Labs    11/04/22 0523  TSH  0.719   Anemia Panel: No results for input(s): "VITAMINB12", "FOLATE", "FERRITIN", "TIBC", "IRON", "RETICCTPCT" in the last 72 hours. Most Recent Urinalysis On File:     Component Value Date/Time   COLORURINE YELLOW (A) 11/02/2022 0418   APPEARANCEUR CLOUDY (A) 11/02/2022 0418   APPEARANCEUR Clear 09/18/2020 1153   LABSPEC 1.039 (H) 11/02/2022 0418   LABSPEC 1.026 03/01/2013 1844   PHURINE 5.0 11/02/2022 0418   GLUCOSEU 150 (A) 11/02/2022 0418   GLUCOSEU >=500 03/01/2013 1844   HGBUR NEGATIVE 11/02/2022 0418   BILIRUBINUR NEGATIVE 11/02/2022 0418   BILIRUBINUR neg 10/23/2020 0953   BILIRUBINUR Negative 09/18/2020 1153   BILIRUBINUR Negative 03/01/2013 1844   KETONESUR NEGATIVE 11/02/2022 0418   PROTEINUR NEGATIVE 11/02/2022 0418   UROBILINOGEN 0.2 10/23/2020 0953   NITRITE NEGATIVE 11/02/2022 0418   LEUKOCYTESUR LARGE (A) 11/02/2022 0418   LEUKOCYTESUR Negative 03/01/2013 1844   Sepsis Labs: @LABRCNTIP (procalcitonin:4,lacticidven:4) Microbiology: Recent Results (from the past 240 hour(s))  Urine Culture (for pregnant, neutropenic or urologic patients or patients with an indwelling urinary catheter)     Status: None   Collection Time: 11/02/22  4:18 AM   Specimen: Urine, Clean Catch  Result Value Ref Range Status   Specimen Description   Final    URINE, CLEAN CATCH Performed at [facility], [address], [place], [place] [address]    Special Requests   Final    NONE Performed at [facility], [address] [place], [place] [address]    Culture   Final    NO GROWTH Performed at [facility], [address] [place], [place] [address]    Report Status 11/03/2022 FINAL  Final      Radiology Studies last 3 days: No results found.           LOS: 5 days      Sunnie Nielsen, DO [facility] 11/06/2022, 12:46 PM    Dictation software may have been used to generate the above note. Typos may occur and escape review  in typed/dictated notes. Please contact Dr Lyn Hollingshead directly for clarity if needed.  Staff may message me via secure chat in Epic  but this may not receive an immediate response,  please page me for urgent matters!  If 7PM-7AM, please contact night coverage [email or web address]

## 2022-11-06 NOTE — Plan of Care (Signed)
  Problem: Education: Goal: Ability to describe self-care measures that may prevent or decrease complications (Diabetes Survival Skills Education) will improve Outcome: Progressing   Problem: Coping: Goal: Ability to adjust to condition or change in health will improve Outcome: Progressing   Problem: Fluid Volume: Goal: Ability to maintain a balanced intake and output will improve Outcome: Progressing   Problem: Health Behavior/Discharge Planning: Goal: Ability to identify and utilize available resources and services will improve Outcome: Progressing   Problem: Metabolic: Goal: Ability to maintain appropriate glucose levels will improve Outcome: Progressing   Problem: Skin Integrity: Goal: Risk for impaired skin integrity will decrease Outcome: Progressing   

## 2022-11-06 NOTE — Progress Notes (Signed)
Brief Nutrition Support Note  Paged by RN. MD requests that TF rate be increased to shorten infusing time to prepare pt for discharge home. Pt with some PO intake but MD requests to at least 50-75% of of needs come from TF. Last full RD note 6/13 list estimated needs as follows:  Estimated Nutritional Needs:  Kcal:  1600-1800 Protein:  85-100 grams Fluid:  > 1.6 L  Will adjust pt to x 10 hours to administer an ever liter of formula (1500 kcal, 82.5g protein) which will provide ~80% of the high end of patient's needs.   If pt needs infusion time to be shortened to 8 hours, this will still provide 1200kcal, 66g of protein and be ~70% of pt's estimated needs. Discussed with RN.   Greig Castilla, RD, LDN Clinical Dietitian RD pager # available in AMION  After hours/weekend pager # available in [facility]

## 2022-11-07 DIAGNOSIS — K3184 Gastroparesis: Secondary | ICD-10-CM | POA: Diagnosis not present

## 2022-11-07 LAB — GLUCOSE, CAPILLARY
Glucose-Capillary: 122 mg/dL — ABNORMAL HIGH (ref 70–99)
Glucose-Capillary: 145 mg/dL — ABNORMAL HIGH (ref 70–99)
Glucose-Capillary: 175 mg/dL — ABNORMAL HIGH (ref 70–99)
Glucose-Capillary: 191 mg/dL — ABNORMAL HIGH (ref 70–99)
Glucose-Capillary: 290 mg/dL — ABNORMAL HIGH (ref 70–99)
Glucose-Capillary: 293 mg/dL — ABNORMAL HIGH (ref 70–99)
Glucose-Capillary: 411 mg/dL — ABNORMAL HIGH (ref 70–99)

## 2022-11-07 LAB — BASIC METABOLIC PANEL
Anion gap: 7 (ref 5–15)
BUN: 43 mg/dL — ABNORMAL HIGH (ref 6–20)
CO2: 29 mmol/L (ref 22–32)
Calcium: 7.5 mg/dL — ABNORMAL LOW (ref 8.9–10.3)
Chloride: 104 mmol/L (ref 98–111)
Creatinine, Ser: 1.22 mg/dL — ABNORMAL HIGH (ref 0.44–1.00)
GFR, Estimated: >60 mL/min (ref >60)
Glucose, Bld: 258 mg/dL — ABNORMAL HIGH (ref 70–99)
Potassium: 4.9 mmol/L (ref 3.5–5.1)
Sodium: 140 mmol/L (ref 135–145)

## 2022-11-07 LAB — PHOSPHORUS: Phosphorus: 3.4 mg/dL (ref 2.5–4.6)

## 2022-11-07 NOTE — Progress Notes (Signed)
PROGRESS NOTE    RAYAHNA VIEN   ZOX:[id number] DOB: 1990/08/16  DOA: 11/01/2022 Date of Service: 11/07/22 PCP: Oneita Hurt, No     Brief Narrative / Hospital Course:  Ms. Pasqualina Winborn is a 32 year old female with insulin-dependent diabetes mellitus type 1, severe gastroparesis, severe malnutrition, status post jejunostomy tube feed, adrenal insufficiency, CKD stage IIIb, hypertension, hypothyroid, who presents to the emergency department at the advice of general surgeon for left-sided abdominal pain, nausea, vomiting and dehydration. Per general surgeon, patient tries to use the jejunostomy tube but it hurts. 06/10: in ED, hyperkalmic in ED. CT no cause for abd pain noted. Admitted to hospitalist service w/ gen surg to consult.  06/11: General surgery to see - ok for slow Jtube feeds (CT scan as well as a Gastrografin contrast via the jejunostomy tube - no extravasation of contrast, no complication or abscess intra-abdominal) and recs for BH eval. Pt unable to take po at this time but still very labile Glc.  06/12: continuing tube feeds for now. BH to see - restart Prozac.  06/13: TOC following w/ DSS. Concern for DV, see RN note from this morning. Working on getting her some resources. Worse abdominal pain today, unable to take any po but tube feeds going ok.  06/14-06/15: better po intake. Tube feeds at 50 mL/h, will try increasing rate but this caused substantial abdominal pain  06/16: trial lower rate today and higher po intake, pt hopeful she might feel ok for discharge tomorrow    Consultants:  General Surgery   Procedures: none      ASSESSMENT & PLAN:   Principal Problem:   Hyperkalemia Active Problems:   Gastroparesis   Adrenal insufficiency (HCC)   Hypothyroidism   Anxiety   Brittle diabetes (HCC)   Diabetic sensorimotor neuropathy (HCC)   Diabetic retinopathy (HCC)   CKD stage 3 due to type 1 diabetes mellitus (HCC)   Essential hypertension   Adrenal cortical  hypofunction (HCC)   Protein-calorie malnutrition, severe (HCC)   CKD stage 3a, GFR 45-59 ml/min (HCC)   Elevated serum creatinine   Abdominal pain   Cystitis   Hyperkalemia - resolved Monitor BMP  Elevated serum creatinine/AKI on CKD3a - improved/resolved  Strict I's and O's  Protein-calorie malnutrition, severe (HCC) Concern for eating disorder / psychiatric component  Registered dietitian following Tube feeds slowly titrating up  176mL/h x 10 hours = 1500 kcal, 82.5g protein ~80% of patient's needs. X 8 hours = 1200kcal, 66g protein ~70% of pt's needs.  Diet po as able  BH eval done- treating anxiety/depression  Type 1 diabetes mellitus (HCC) labile Insulin long-acting Insulin SSI q4h for now given low po intake and tube feeds running  Goal inpatient blood glucose levels 140-180 Very labile Glc, chronic  Has been d/c from [facility] endocrine d/t no-shows, but this was result of transportation issues outside her control, hopefully can get her set up w/ endocrinology again once [facility] ensures transportation plan   Abdominal pain Gastroparesis, chronic  General surgery consulted - no concerns at this time regarding jejunostomy tube Pain control   Essential hypertension Hydralazine 5 mg IV every 8 hours as needed for SBP greater 175  GAD, MDD  Patient states she did not have a refill for her antidepression/anxiety medication Ativan 0.5 mg IV every 6 hours as needed for anxiety Psychiatry consulted here to address anxiety/depression and concern for eating disorder - Prozac started   Hypothyroidism Levothyroxine 50 mcg daily before breakfast resumed  Adrenal insufficiency Patient  had not taken her Cortef over the last 2 days PTA due to nausea Resumed home Cortef 20 mg in the morning and 10 mg at with supper  Cystitis / UTI ruled out  Ceftriaxone was given x2 doses pending urine culture --> UCx NG and no symptoms, will d/c abx   Difficult social situation Lives w/ father  of her kids but he is not helpful, they are essentially living separately he is upstairs she is downstairs w/ kids. Young child learning to walk, grabs at her feeding tubes, difficult to administer her feeds at hom. Her mom helps some w/ transportation but limited ability to help watch kids. Father of her kids and she do not have a good relationship, she reports he is using her disability funds for himself and has threatened to not let her move back in when she leaves the hospital.     DVT prophylaxis: refusing SCD and lovenox/heparin  Pertinent IV fluids/nutrition: no continuous IV fluids, diabetic diet as tolerated, tube feeds as tolerated.  Central lines / invasive devices: J-tube  Code Status: FULL CODE  ACP documentation reviewed: 11/02/22 none on file   Current Admission Status: inpatient   TOC needs / Dispo plan: TBD, expect home to previous home environment +/- HH  Barriers to discharge / significant pending items: hopefully will be able to d/c home tomorrow              Subjective / Brief ROS:  Patient reports po intake has been difficult Abdominal cramping severe w/ increased rate tube feeds  Denies CP/SOB.  Pain controlled.  Denies new weakness.  Reports loose stool   Family Communication: none at this time     Objective Findings:  Vitals:   11/06/22 1522 11/07/22 0028 11/07/22 0425 11/07/22 0755  BP: (!) 147/86 120/73  (!) 149/77  Pulse: 80 91  82  Resp: 17 19  16   Temp: 98.2 F (36.8 C) 98 F (36.7 C)  (!) 97.5 F (36.4 C)  TempSrc:    Oral  SpO2: 100% 100%  100%  Weight:   49.5 kg   Height:        Intake/Output Summary (Last 24 hours) at 11/07/2022 1450 Last data filed at 11/07/2022 0842 Gross per 24 hour  Intake 1720 ml  Output --  Net 1720 ml   Filed Weights   11/05/22 0703 11/06/22 0700 11/07/22 0425  Weight: 47.6 kg 48.3 kg 49.5 kg    Examination:  Physical Exam Constitutional:      General: She is not in acute distress.     Appearance: She is ill-appearing.  Pulmonary:     Effort: Pulmonary effort is normal.     Breath sounds: Normal breath sounds.  Abdominal:     General: Abdomen is flat. Bowel sounds are increased.     Tenderness: There is no guarding or rebound.  Neurological:     Mental Status: She is alert. Mental status is at baseline.  Psychiatric:        Mood and Affect: Mood normal.        Behavior: Behavior normal.          Scheduled Medications:   divalproex  250 mg Oral Daily   feeding supplement (GLUCERNA 1.5 CAL)  1,000 mL Per Tube Q24H   ferrous sulfate  325 mg Oral Q breakfast   FLUoxetine  10 mg Oral Daily   free water  30 mL Per Tube Q4H   hydrocortisone  20 mg Oral Daily  And   hydrocortisone  10 mg Oral QHS   insulin aspart  0-6 Units Subcutaneous Q4H   insulin aspart  2 Units Subcutaneous Q4H   insulin glargine-yfgn  2 Units Subcutaneous BID   levothyroxine  50 mcg Oral QAC breakfast   multivitamin with minerals  1 tablet Oral Daily   thiamine  100 mg Oral Daily    Continuous Infusions:  promethazine (PHENERGAN) injection (IM or IVPB) Stopped (11/04/22 0757)    PRN Medications:  dicyclomine, loperamide, LORazepam, morphine injection, ondansetron, promethazine (PHENERGAN) injection (IM or IVPB), senna-docusate  Antimicrobials from admission:  Anti-infectives (From admission, onward)    Start     Dose/Rate Route Frequency Ordered Stop   11/02/22 2000  cefTRIAXone (ROCEPHIN) 1 g in sodium chloride 0.9 % 100 mL IVPB  Status:  Discontinued        1 g 200 mL/hr over 30 Minutes Intravenous  Once 11/02/22 0837 11/02/22 0837   11/02/22 2000  cefTRIAXone (ROCEPHIN) 1 g in sodium chloride 0.9 % 100 mL IVPB  Status:  Discontinued        1 g 200 mL/hr over 30 Minutes Intravenous Every 24 hours 11/02/22 0837 11/03/22 1031   11/01/22 2100  cefTRIAXone (ROCEPHIN) 1 g in sodium chloride 0.9 % 100 mL IVPB        1 g 200 mL/hr over 30 Minutes Intravenous  Once 11/01/22 2054  11/01/22 2214           Data Reviewed:  I have personally reviewed the following...  CBC: Recent Labs  Lab 11/01/22 1532 11/02/22 0354 11/04/22 0523  WBC 4.2 2.9* 3.6*  HGB 8.8* 8.4* 8.4*  HCT 26.5* 24.7* 24.6*  MCV 88.3 87.9 88.2  PLT 152 127* 127*   Basic Metabolic Panel: Recent Labs  Lab 11/03/22 0545 11/03/22 1655 11/04/22 0523 11/05/22 0323 11/06/22 0422 11/07/22 0524  NA 140  --  142 146* 145 140  K 4.9  --  5.0 4.4 4.0 4.9  CL 113*  --  111 113* 110 104  CO2 21*  --  24 25 27 29   GLUCOSE 198*  --  230* 222* 129* 258*  BUN 18  --  30* 41* 39* 43*  CREATININE 1.31*  --  1.39* 1.27* 1.22* 1.22*  CALCIUM 8.0*  --  8.7* 8.3* 7.5* 7.5*  MG 2.0 2.1 2.2 2.1 2.0  --   PHOS 2.8 1.4* 1.6* 1.9* 2.4* 3.4   GFR: Estimated Creatinine Clearance: 50.4 mL/min (A) (by C-G formula based on SCr of 1.22 mg/dL (H)). Liver Function Tests: Recent Labs  Lab 11/01/22 1532 11/06/22 0422  AST 18 20  ALT 8 10  ALKPHOS 64 50  BILITOT 0.9 0.4  PROT 7.1 5.3*  ALBUMIN 4.3 3.1*   No results for input(s): "LIPASE", "AMYLASE" in the last 168 hours. No results for input(s): "AMMONIA" in the last 168 hours. Coagulation Profile: No results for input(s): "INR", "PROTIME" in the last 168 hours. Cardiac Enzymes: No results for input(s): "CKTOTAL", "CKMB", "CKMBINDEX", "TROPONINI" in the last 168 hours. BNP (last 3 results) No results for input(s): "PROBNP" in the last 8760 hours. HbA1C: No results for input(s): "HGBA1C" in the last 72 hours.  CBG: Recent Labs  Lab 11/06/22 2030 11/07/22 0030 11/07/22 0411 11/07/22 0755 11/07/22 1122  GLUCAP 246* 175* 145* 411* 290*   Lipid Profile: No results for input(s): "CHOL", "HDL", "LDLCALC", "TRIG", "CHOLHDL", "LDLDIRECT" in the last 72 hours. Thyroid Function Tests: No results for input(s): "TSH", "T4TOTAL", "FREET4", "  T3FREE", "THYROIDAB" in the last 72 hours.  Anemia Panel: No results for input(s): "VITAMINB12", "FOLATE",  "FERRITIN", "TIBC", "IRON", "RETICCTPCT" in the last 72 hours. Most Recent Urinalysis On File:     Component Value Date/Time   COLORURINE YELLOW (A) 11/02/2022 0418   APPEARANCEUR CLOUDY (A) 11/02/2022 0418   APPEARANCEUR Clear 09/18/2020 1153   LABSPEC 1.039 (H) 11/02/2022 0418   LABSPEC 1.026 03/01/2013 1844   PHURINE 5.0 11/02/2022 0418   GLUCOSEU 150 (A) 11/02/2022 0418   GLUCOSEU >=500 03/01/2013 1844   HGBUR NEGATIVE 11/02/2022 0418   BILIRUBINUR NEGATIVE 11/02/2022 0418   BILIRUBINUR neg 10/23/2020 0953   BILIRUBINUR Negative 09/18/2020 1153   BILIRUBINUR Negative 03/01/2013 1844   KETONESUR NEGATIVE 11/02/2022 0418   PROTEINUR NEGATIVE 11/02/2022 0418   UROBILINOGEN 0.2 10/23/2020 0953   NITRITE NEGATIVE 11/02/2022 0418   LEUKOCYTESUR LARGE (A) 11/02/2022 0418   LEUKOCYTESUR Negative 03/01/2013 1844   Sepsis Labs: @LABRCNTIP (procalcitonin:4,lacticidven:4) Microbiology: Recent Results (from the past 240 hour(s))  Urine Culture (for pregnant, neutropenic or urologic patients or patients with an indwelling urinary catheter)     Status: None   Collection Time: 11/02/22  4:18 AM   Specimen: Urine, Clean Catch  Result Value Ref Range Status   Specimen Description   Final    URINE, CLEAN CATCH Performed at [facility], [address] [place], [place] [address]    Special Requests   Final    NONE Performed at [facility], [address] [place], [place] [address]    Culture   Final    NO GROWTH Performed at [facility], [address] [place], [place] [address]    Report Status 11/03/2022 FINAL  Final      Radiology Studies last 3 days: No results found.           LOS: 6 days      Sunnie Nielsen, DO [facility] 11/07/2022, 2:50 PM    Dictation software may have been used to generate the above note. Typos may occur and escape review in typed/dictated notes. Please contact Dr Lyn Hollingshead directly for clarity  if needed.  Staff may message me via secure chat in Epic  but this may not receive an immediate response,  please page me for urgent matters!  If 7PM-7AM, please contact night coverage [email or web address]

## 2022-11-07 NOTE — Plan of Care (Signed)
  Problem: Coping: Goal: Ability to adjust to condition or change in health will improve Outcome: Progressing   Problem: Skin Integrity: Goal: Risk for impaired skin integrity will decrease Outcome: Progressing   Problem: Nutrition: Goal: Adequate nutrition will be maintained Outcome: Not Progressing

## 2022-11-07 NOTE — Consult Note (Addendum)
PHARMACY CONSULT NOTE  Pharmacy Consult for Electrolyte Monitoring and Replacement   Recent Labs: Potassium (mmol/L)  Date Value  11/07/2022 4.9  03/01/2013 4.1   Magnesium (mg/dL)  Date Value  16/02/9603 2.0   Calcium (mg/dL)  Date Value  54/01/8118 7.5 (L)   Calcium, Total (mg/dL)  Date Value  14/78/2956 9.1   Albumin (g/dL)  Date Value  21/30/8657 3.1 (L)  03/01/2013 3.9   Phosphorus (mg/dL)  Date Value  84/69/6295 3.4   Sodium (mmol/L)  Date Value  11/07/2022 140  03/01/2013 133 (L)    Assessment: 32 yo female with PMH including T1DM, severe gastroparesis, malnutrition s/p J-tube placement, adrenal insufficiency, CKD, and HTN.  Presented to ED due to abdominal pain.  Surgical consult determined J-tube is functional and started slow tube feeding.  Goal of Therapy:  Within normal limits  Plan:  --No electrolyte replacement indicated at this time --Follow-up electrolytes labs as ordered by provider  Tressie Ellis 11/07/2022 7:10 AM

## 2022-11-07 NOTE — Progress Notes (Signed)
Notified Dr. Lyn Hollingshead of patient's CBG of 411 along with stomach cramping. Patient reports that bentyl has not been helping with cramps.

## 2022-11-08 DIAGNOSIS — K3184 Gastroparesis: Secondary | ICD-10-CM | POA: Diagnosis not present

## 2022-11-08 DIAGNOSIS — E875 Hyperkalemia: Secondary | ICD-10-CM | POA: Diagnosis not present

## 2022-11-08 LAB — GLUCOSE, CAPILLARY
Glucose-Capillary: 133 mg/dL — ABNORMAL HIGH (ref 70–99)
Glucose-Capillary: 325 mg/dL — ABNORMAL HIGH (ref 70–99)
Glucose-Capillary: 115 mg/dL — ABNORMAL HIGH (ref 70–99)
Glucose-Capillary: 121 mg/dL — ABNORMAL HIGH (ref 70–99)
Glucose-Capillary: 185 mg/dL — ABNORMAL HIGH (ref 70–99)

## 2022-11-08 LAB — TSH: TSH: 1.809 u[IU]/mL (ref 0.350–4.500)

## 2022-11-08 MED ORDER — INSULIN GLARGINE-YFGN 100 UNIT/ML ~~LOC~~ SOLN
4.0000 [IU] | Freq: Every day | SUBCUTANEOUS | Status: DC
  Administered 2022-11-08: 4 [IU] via SUBCUTANEOUS
  Filled 2022-11-08 (×2): qty 0.04

## 2022-11-08 MED ORDER — INSULIN GLARGINE-YFGN 100 UNIT/ML ~~LOC~~ SOLN
2.0000 [IU] | SUBCUTANEOUS | Status: DC
  Administered 2022-11-09 – 2022-11-14 (×6): 2 [IU] via SUBCUTANEOUS
  Filled 2022-11-08 (×7): qty 0.02

## 2022-11-08 MED ORDER — ACETAMINOPHEN 160 MG/5ML PO SOLN
500.0000 mg | Freq: Four times a day (QID) | ORAL | Status: DC | PRN
  Administered 2022-11-09 – 2022-11-12 (×3): 500 mg
  Filled 2022-11-08 (×5): qty 20.3

## 2022-11-08 MED ORDER — METOCLOPRAMIDE HCL 10 MG/10ML PO SOLN
10.0000 mg | Freq: Three times a day (TID) | ORAL | Status: DC
  Administered 2022-11-08 – 2022-11-09 (×3): 10 mg
  Filled 2022-11-08 (×5): qty 10

## 2022-11-08 NOTE — Inpatient Diabetes Management (Signed)
Inpatient Diabetes Program Recommendations  AACE/ADA: New Consensus Statement on Inpatient Glycemic Control (2015)  Target Ranges:  Prepandial:   less than 140 mg/dL      Peak postprandial:   less than 180 mg/dL (1-2 hours)      Critically ill patients:  140 - 180 mg/dL   Lab Results  Component Value Date   GLUCAP 325 (H) 11/08/2022   HGBA1C 9.4 (H) 11/02/2022    Current Orders: Semglee 2 units BID  Novolog 0-9 units TID ac/hs       Cortef 20 mg AM/ 10 mg PM    Glucerna Tube Feeds 100cc/hr  over 10 hrs.  Inpatient Diabetes Program Recommendations:   Noted am hyperglycemia.  Please consider: -Increase pm dose of Semglee to 3 units  Thank you, Darel Hong E. Zalan Shidler, RN, MSN, CDE  Diabetes Coordinator Inpatient Glycemic Control Team Team Pager [phone] (8am-5pm) 11/08/2022 10:23 AM
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
## 2022-11-09 DIAGNOSIS — K3184 Gastroparesis: Secondary | ICD-10-CM | POA: Diagnosis not present

## 2022-11-09 DIAGNOSIS — E875 Hyperkalemia: Secondary | ICD-10-CM | POA: Diagnosis not present

## 2022-11-09 LAB — GLUCOSE, CAPILLARY
Glucose-Capillary: 100 mg/dL — ABNORMAL HIGH (ref 70–99)
Glucose-Capillary: 104 mg/dL — ABNORMAL HIGH (ref 70–99)
Glucose-Capillary: 141 mg/dL — ABNORMAL HIGH (ref 70–99)
Glucose-Capillary: 179 mg/dL — ABNORMAL HIGH (ref 70–99)
Glucose-Capillary: 241 mg/dL — ABNORMAL HIGH (ref 70–99)
Glucose-Capillary: 250 mg/dL — ABNORMAL HIGH (ref 70–99)
Glucose-Capillary: 261 mg/dL — ABNORMAL HIGH (ref 70–99)
Glucose-Capillary: 41 mg/dL — CL (ref 70–99)
Glucose-Capillary: 46 mg/dL — ABNORMAL LOW (ref 70–99)

## 2022-11-09 MED ORDER — GLUCERNA 1.5 CAL PO LIQD
780.0000 mL | ORAL | Status: DC
  Administered 2022-11-09 – 2022-11-10 (×2): 780 mL

## 2022-11-09 MED ORDER — GLUCOSE 40 % PO GEL
ORAL | Status: AC
  Filled 2022-11-09: qty 1

## 2022-11-09 MED ORDER — GLUCERNA 1.5 CAL PO LIQD: 1000.0000 mL | ORAL | Status: DC

## 2022-11-09 MED ORDER — NAPROXEN 250 MG PO TABS
250.0000 mg | ORAL_TABLET | Freq: Three times a day (TID) | ORAL | Status: AC
  Administered 2022-11-09 – 2022-11-11 (×5): 250 mg via ORAL
  Filled 2022-11-09 (×5): qty 1

## 2022-11-09 MED ORDER — INSULIN GLARGINE-YFGN 100 UNIT/ML ~~LOC~~ SOLN
2.0000 [IU] | Freq: Every day | SUBCUTANEOUS | Status: DC
  Administered 2022-11-09 – 2022-11-13 (×5): 2 [IU] via SUBCUTANEOUS
  Filled 2022-11-09 (×6): qty 0.02

## 2022-11-09 MED ORDER — METHOCARBAMOL 500 MG PO TABS
500.0000 mg | ORAL_TABLET | Freq: Three times a day (TID) | ORAL | Status: DC
  Administered 2022-11-09 – 2022-11-15 (×18): 500 mg via ORAL
  Filled 2022-11-09 (×17): qty 1

## 2022-11-09 MED ORDER — HYDROCORTISONE ACETATE 25 MG RE SUPP: 25.0000 mg | Freq: Two times a day (BID) | RECTAL | Status: DC | PRN

## 2022-11-09 NOTE — Progress Notes (Signed)
Nutrition Follow-up  DOCUMENTATION CODES:   Underweight, Severe malnutrition in context of social or environmental circumstances  INTERVENTION:   -Continue regular diet -Nocturnal TF via j-tube:   Glucerna 1.5 @ 65 ml/hr x 12 hours  30 ml free water flush every 4 hours  Tube feeding regimen provides 1170 kcal (73% of needs), 64 grams of protein, and 592 ml of H2O. Total free water: 772 ml daily  NUTRITION DIAGNOSIS:   Severe Malnutrition related to social / environmental circumstances as evidenced by moderate fat depletion, severe fat depletion, moderate muscle depletion, severe muscle depletion, percent weight loss.  Ongoing  GOAL:   Patient will meet greater than or equal to 90% of their needs  Progressing   MONITOR:   PO intake, Supplement acceptance, TF tolerance  REASON FOR ASSESSMENT:   Consult Assessment of nutrition requirement/status  ASSESSMENT:   Pt with insulin-dependent diabetes mellitus type 1, severe gastroparesis, severe malnutrition, status post jejunostomy tube feed, adrenal insufficiency, CKD stage IIIb, hypertension, hypothyroid, who presents for left-sided abdominal pain, nausea, vomiting and dehydration.  Reviewed I/O's: +8.7 L x 24 hours  Spoke with pt at bedside, who complains of stomach spasms. Pt shares concern that she is not eating much; she drank only a carton milk at breakfast this morning. Her intake is erratic at baseline. Pt reports she did pretty well with eating yesterday; she ate some oatmeal for breakfast and a half sandwich for lunch.   She still has some pain and spasms when eating, reports morphine and ativan work the best for her. Pt tries to order the same types of foods at meals times, as she knows that she can tolerate them, but finds it difficult to monitor response to medications and other therapies as they are often given at the same time (ex pt was unable to determine if morphine or reglan helped with pain or TF tolerance  as they were given to her at the same time). Noted meal completion 15-100%.   Pt had multiple questions about what she should be eating. RD reviewed gastroparesis nutrition guidelines and importance of avoiding high fat, high fiber foods, as these also delay gastric emptying. Reviewed menu with pt and discussed best food choices for her. Pt has also been consuming a lot of broth and jello. She admitted to eating a lot of fast food and processed foods PTA, which "made me feel bad".  Pt complains of pain with TF. Pt reports that TF were held for 2 hours overnight and the "pushed through" over the rest of the night. She also complained of loose stools and hemorrhoids. She shares stools have improved with addition of imodium. Pt worried about discharge home and potential of returning back to the hospital. She admitted to this RD that some of her fear of pain may be psychological or apprehension due to previous bad experiences, but shares ativan has been helping with this. Discussed importance of TF (as this is where she will receive of the majority of her nutrition) and eating for comfort.  Above concerns discussed with Dr. Lyn Hollingshead and RN. Plan to decrease TF rate to 65 ml/hr for better tolerance. Pt will likely discharge home today.   Medications reviewed and include ferrous sulfate, thiamine, reglan, and phenergan.   Labs reviewed: CBGS: 46-100 (inpatient orders for glycemic control are 0-6 units insulin aspart every 4 hours, 2 units insulin aspart every 4 hours, 2 units insulin glargine-yfgn daily).    Diet Order:   Diet Order  Diet regular Fluid consistency: Thin  Diet effective now                   EDUCATION NEEDS:   Education needs have been addressed  Skin:  Skin Assessment: Reviewed RN Assessment  Last BM:  11/09/22 (type 7)  Height:   Ht Readings from Last 1 Encounters:  11/01/22 5\' 1"  (1.549 m)    Weight:   Wt Readings from Last 1 Encounters:  11/07/22  49.5 kg    Ideal Body Weight:  47.7 kg  BMI:  Body mass index is 20.62 kg/m.  Estimated Nutritional Needs:   Kcal:  1600-1800  Protein:  85-100 grams  Fluid:  > 1.6 L    Levada Schilling, RD, LDN, CDCES Registered Dietitian II Certified Diabetes Care and Education Specialist Please refer to [facility] for RD and/or RD on-call/weekend/after hours pager

## 2022-11-09 NOTE — Progress Notes (Signed)
Patient blood glucose was 41, she was given 2 cups of orange juice with sugar added, the hypogylcemia protocol  has been activated, will continue until blood glucose level has been reached to normal, provider has been notified,and will continue to monitor.
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
## 2022-11-09 NOTE — Progress Notes (Signed)
Patient called this writer to bedside at this time stating, I can't take this anymore, my stomach is cramping, and I keep going to the bathroom." At this time the patient has had a total of 3 loose stools during the night that this writer is aware of occurring. Pt agreed to take another dose of lomotil and continue tube feed. Refusing bentyl "it does not help." Pt given dose of ativan during the night d/t anxiety. Pt provided with words of encouragement, provided with education as to why morphine was discontinued. Will continue to monitor

## 2022-11-10 DIAGNOSIS — K3184 Gastroparesis: Secondary | ICD-10-CM | POA: Diagnosis not present

## 2022-11-10 LAB — COMPREHENSIVE METABOLIC PANEL
ALT: 16 U/L (ref 0–44)
AST: 30 U/L (ref 15–41)
Albumin: 3.1 g/dL — ABNORMAL LOW (ref 3.5–5.0)
Alkaline Phosphatase: 46 U/L (ref 38–126)
Anion gap: 6 (ref 5–15)
BUN: 64 mg/dL — ABNORMAL HIGH (ref 6–20)
CO2: 28 mmol/L (ref 22–32)
Calcium: 8.2 mg/dL — ABNORMAL LOW (ref 8.9–10.3)
Chloride: 106 mmol/L (ref 98–111)
Creatinine, Ser: 1.39 mg/dL — ABNORMAL HIGH (ref 0.44–1.00)
GFR, Estimated: 52 mL/min — ABNORMAL LOW (ref >60)
Glucose, Bld: 190 mg/dL — ABNORMAL HIGH (ref 70–99)
Potassium: 6 mmol/L — ABNORMAL HIGH (ref 3.5–5.1)
Sodium: 140 mmol/L (ref 135–145)
Total Bilirubin: 0.6 mg/dL (ref 0.3–1.2)
Total Protein: 5.5 g/dL — ABNORMAL LOW (ref 6.5–8.1)

## 2022-11-10 LAB — CBC
HCT: 23.8 % — ABNORMAL LOW (ref 36.0–46.0)
Hemoglobin: 7.5 g/dL — ABNORMAL LOW (ref 12.0–15.0)
MCH: 29.5 pg (ref 26.0–34.0)
MCHC: 31.5 g/dL (ref 30.0–36.0)
MCV: 93.7 fL (ref 80.0–100.0)
Platelets: 174 10*3/uL (ref 150–400)
RBC: 2.54 MIL/uL — ABNORMAL LOW (ref 3.87–5.11)
RDW: 12.5 % (ref 11.5–15.5)
WBC: 7 10*3/uL (ref 4.0–10.5)
nRBC: 0 % (ref 0.0–0.2)

## 2022-11-10 LAB — BASIC METABOLIC PANEL
Anion gap: 9 (ref 5–15)
BUN: 56 mg/dL — ABNORMAL HIGH (ref 6–20)
CO2: 24 mmol/L (ref 22–32)
Calcium: 7.9 mg/dL — ABNORMAL LOW (ref 8.9–10.3)
Chloride: 106 mmol/L (ref 98–111)
Creatinine, Ser: 1.31 mg/dL — ABNORMAL HIGH (ref 0.44–1.00)
GFR, Estimated: 56 mL/min — ABNORMAL LOW (ref >60)
Glucose, Bld: 149 mg/dL — ABNORMAL HIGH (ref 70–99)
Potassium: 5.7 mmol/L — ABNORMAL HIGH (ref 3.5–5.1)
Sodium: 139 mmol/L (ref 135–145)

## 2022-11-10 LAB — GLUCOSE, CAPILLARY
Glucose-Capillary: 131 mg/dL — ABNORMAL HIGH (ref 70–99)
Glucose-Capillary: 132 mg/dL — ABNORMAL HIGH (ref 70–99)
Glucose-Capillary: 133 mg/dL — ABNORMAL HIGH (ref 70–99)
Glucose-Capillary: 138 mg/dL — ABNORMAL HIGH (ref 70–99)
Glucose-Capillary: 195 mg/dL — ABNORMAL HIGH (ref 70–99)
Glucose-Capillary: 230 mg/dL — ABNORMAL HIGH (ref 70–99)
Glucose-Capillary: 293 mg/dL — ABNORMAL HIGH (ref 70–99)

## 2022-11-10 MED ORDER — SODIUM CHLORIDE 0.9 % IV SOLN: INTRAVENOUS | Status: AC

## 2022-11-10 MED ORDER — LORAZEPAM 0.5 MG PO TABS
0.5000 mg | ORAL_TABLET | Freq: Three times a day (TID) | ORAL | Status: DC
  Administered 2022-11-10 – 2022-11-13 (×11): 0.5 mg via ORAL
  Filled 2022-11-10 (×11): qty 1

## 2022-11-10 MED ORDER — SODIUM CHLORIDE 0.9 % IV SOLN: INTRAVENOUS | Status: DC
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
## 2022-11-11 DIAGNOSIS — K3184 Gastroparesis: Secondary | ICD-10-CM | POA: Diagnosis not present

## 2022-11-11 LAB — BASIC METABOLIC PANEL
Anion gap: 7 (ref 5–15)
BUN: 63 mg/dL — ABNORMAL HIGH (ref 6–20)
CO2: 29 mmol/L (ref 22–32)
Calcium: 8.1 mg/dL — ABNORMAL LOW (ref 8.9–10.3)
Chloride: 107 mmol/L (ref 98–111)
Creatinine, Ser: 1.42 mg/dL — ABNORMAL HIGH (ref 0.44–1.00)
GFR, Estimated: 51 mL/min — ABNORMAL LOW (ref >60)
Glucose, Bld: 63 mg/dL — ABNORMAL LOW (ref 70–99)
Potassium: 5.6 mmol/L — ABNORMAL HIGH (ref 3.5–5.1)
Sodium: 143 mmol/L (ref 135–145)

## 2022-11-11 LAB — GLUCOSE, CAPILLARY
Glucose-Capillary: 60 mg/dL — ABNORMAL LOW (ref 70–99)
Glucose-Capillary: 99 mg/dL (ref 70–99)
Glucose-Capillary: 296 mg/dL — ABNORMAL HIGH (ref 70–99)
Glucose-Capillary: 252 mg/dL — ABNORMAL HIGH (ref 70–99)
Glucose-Capillary: 75 mg/dL (ref 70–99)
Glucose-Capillary: 296 mg/dL — ABNORMAL HIGH (ref 70–99)

## 2022-11-11 MED ORDER — SODIUM ZIRCONIUM CYCLOSILICATE 10 G PO PACK
10.0000 g | PACK | Freq: Two times a day (BID) | ORAL | Status: AC
  Administered 2022-11-11 – 2022-11-12 (×2): 10 g via ORAL
  Filled 2022-11-11 (×2): qty 1

## 2022-11-11 MED ORDER — NEPRO/CARBSTEADY PO LIQD
1000.0000 mL | ORAL | Status: DC
  Administered 2022-11-11: 1000 mL

## 2022-11-11 NOTE — Plan of Care (Signed)
  Problem: Education: Goal: Knowledge of General Education information will improve Description: Including pain rating scale, medication(s)/side effects and non-pharmacologic comfort measures Outcome: Progressing   Problem: Health Behavior/Discharge Planning: Goal: Ability to manage health-related needs will improve Outcome: Progressing   Problem: Clinical Measurements: Goal: Ability to maintain clinical measurements within normal limits will improve Outcome: Progressing Goal: Diagnostic test results will improve Outcome: Progressing   Problem: Activity: Goal: Risk for activity intolerance will decrease Outcome: Progressing   Problem: Nutrition: Goal: Adequate nutrition will be maintained Outcome: Progressing   Problem: Elimination: Goal: Will not experience complications related to bowel motility Outcome: Progressing Goal: Will not experience complications related to urinary retention Outcome: Progressing   Problem: Pain Managment: Goal: General experience of comfort will improve Outcome: Progressing   

## 2022-11-11 NOTE — Plan of Care (Signed)

## 2022-11-11 NOTE — Progress Notes (Signed)
PROGRESS NOTE  LAZARIA KUDRICK WUJ:[id number] DOB: 1990-11-01 DOA: 11/01/2022 PCP: Pcp, No  HPI/Recap of past 24 hours:  Ms. Thresia Lanza is a 32 year old female with insulin-dependent diabetes mellitus type 1, severe gastroparesis, severe malnutrition, status post jejunostomy tube feed, adrenal insufficiency, CKD stage IIIb, hypertension, hypothyroid, who presents to the emergency department at the advice of general surgeon for left-sided abdominal pain, nausea, vomiting and dehydration. Per general surgeon, patient tries to use the jejunostomy tube but it hurts. 06/10: hyperkalemic in ED. Admitted to hospitalist service w/ gen surg to consult.  06/11: General surgery - ok for slow Jtube feeds (CT scan as well as a Gastrografin contrast via the J tube - no extravasation of contrast, no complication or abscess intra-abdominal) and recs for BH eval. Pt unable to take po at this time but still very labile Glc.  06/12: continuing tube feeds for now. Psych - restart Prozac.  06/13: TOC following w/ DSS. Concern for DV, see RN note from this morning. Working on getting her some resources. Worse abdominal pain today, unable to take any po but tube feeds going ok.  06/14-06/15: better po intake. Tube feeds at 50 mL/h, will try increasing  06/16: increased rate caused substantial pain. Trial lower rate today and higher po intake, pt hopeful she might feel ok for discharge soon  06/17: substantial pain w/ po intake or tube feeds higher than 50 mL/h (tried at 100 and 75 overnight) requiring opiate pain control. Bentyl not helpful yesterday. Will trial Reglan and d/c morphine. Check TSH - WNL. [facility] Endocrinology, spoke w/ nurse manager who will ask about getting patient reestablished.  06/18: still severe pain. Encouraged ambulation.  06/19: pain improved w/ benzo therapy. K elevated, restarted IV fluids.    11/11/2022: Reports abdominal cramping with eating overnight.  She tried to ignore it.   Refractory hyperkalemia, Lokelma 10 g x 2.  Dietitian consulted, tube feeding solution may need to be changed.  Assessment/Plan: Active Problems:   Gastroparesis   Adrenal insufficiency (HCC)   Hypothyroidism   Anxiety   Brittle diabetes (HCC)   Diabetic sensorimotor neuropathy (HCC)   Diabetic retinopathy (HCC)   Hyperkalemia   CKD stage 3 due to type 1 diabetes mellitus (HCC)   Essential hypertension   Adrenal cortical hypofunction (HCC)   Protein-calorie malnutrition, severe (HCC)   CKD stage 3a, GFR 45-59 ml/min (HCC)   Elevated serum creatinine   Abdominal pain   Cystitis   Severe recurrent major depression (HCC)   Protein-calorie malnutrition, severe (HCC) Registered dietitian following Tube feeds slowly titrating up but this caused significant abdominal pain 156mL/h x 10 hours = 1000 mL, 1500 kcal, 82.5g protein ~80% of patient's needs. 100 mL/h x 8 hours = 800 mL, 1200 kcal, 66g protein ~70% of pt's needs.  Diet po as able, has taken minimal oral intake but slowly improving  BH eval done- treating anxiety/depression, no mention for eating disorder  Dietician following Unfortunately, has been experiencing severe abdominal pain w/ any oral or tube intake. No new features that would concern for surgeon to reevaluate. Given her issues w/ tube feeds at home (caring for small child without help, child pulls at tubes/lines, pt does not get predictable sleep and she co-sleeps with the child as well) doubt TPN is worth exploring as this would pose same problems and higher risk.  Pt repots ativan is helping w/ pain/anxiety, will schedule low dose po and d/c IV   Refractory hyperkalemia Lokelma 10 g  twice daily May be from tube feeding solution May need to change to Nepro which has less potassium and recheck labs in the morning   Type 1 diabetes mellitus (HCC) labile Insulin long-acting Insulin SSI q4h for now given low po intake and tube feeds running  Goal inpatient blood  glucose levels 140-180 Very labile Glc, chronic  Has been d/c from [facility] endocrine d/t no-shows, but this was result of transportation issues outside her control, hopefully can get her set up w/ endocrinology again once [facility] ensures transportation plan. Waiting to hear back form the clinic, spoke w/ nurse manager on 06/17   Abdominal pain Gastroparesis, chronic  General surgery consulted - no concerns at this time regarding jejunostomy tube Pain control    Elevated serum creatinine/AKI on CKD3a - improved/resolved  Strict I's and O's   Essential hypertension Hydralazine 5 mg IV every 8 hours as needed for SBP greater 175   GAD, MDD  Patient states she did not have a refill for her antidepression/anxiety medication Ativan 0.5 mg IV every 6 hours as needed for anxiety --> po 0.5 mg q8h scheduled  Psychiatry consulted here to address anxiety/depression and concern for eating disorder - Prozac started    Hypothyroidism, TSH WNL Levothyroxine 50 mcg daily before breakfast resumed   Adrenal insufficiency Patient had not taken her Cortef over the last 2 days PTA due to nausea Resumed home Cortef 20 mg in the morning and 10 mg at with supper   Cystitis / UTI ruled out  Ceftriaxone was given x2 doses pending urine culture --> UCx NG and no symptoms, will d/c abx    Difficult social situation Lives w/ father of her kids but he is not helpful, they are essentially living separately he is upstairs she is downstairs w/ kids. Young child learning to walk, grabs at her feeding tubes, difficult to administer her feeds at hom. Her mom helps some w/ transportation but limited ability to help watch kids. Father of her kids and she do not have a good relationship, she reports he is using her disability funds for himself and has threatened to not let her move back in when she leaves the hospital.        DVT prophylaxis: refusing SCD and lovenox/heparin  Pertinent IV fluids/nutrition: no continuous  IV fluids, diabetic diet as tolerated, tube feeds as tolerated.  Central lines / invasive devices: J-tube   Code Status: FULL CODE  ACP documentation reviewed: 11/02/22 none on file    Current Admission Status: inpatient   TOC needs / Dispo plan: TBD, expect home to previous home environment +/- HH  Barriers to discharge / significant pending items: hopefully will be able to d/c home soon          Status is: Inpatient     Objective: Vitals:   11/10/22 2347 11/11/22 0807 11/11/22 1512 11/11/22 1601  BP: 133/66 (!) 151/83 132/76 139/82  Pulse: 87 87 87 85  Resp: 20 16 15 18   Temp: 98.3 F (36.8 C) 98.8 F (37.1 C) 98.6 F (37 C) 98.4 F (36.9 C)  TempSrc:      SpO2: 100% 100% 100% 100%  Weight:      Height:        Intake/Output Summary (Last 24 hours) at 11/11/2022 1753 Last data filed at 11/11/2022 1615 Gross per 24 hour  Intake 2064.55 ml  Output --  Net 2064.55 ml   Filed Weights   11/05/22 0703 11/06/22 0700 11/07/22 0425  Weight:  47.6 kg 48.3 kg 49.5 kg    Exam:  General: 32 y.o. year-old female well developed well nourished in no acute distress.  Alert and oriented x3. Cardiovascular: Regular rate and rhythm with no rubs or gallops.  No thyromegaly or JVD noted.   Respiratory: Clear to auscultation with no wheezes or rales. Good inspiratory effort. Abdomen: Soft nontender nondistended with normal bowel sounds x4 quadrants. Musculoskeletal: No lower extremity edema. 2/4 pulses in all 4 extremities. Skin: No ulcerative lesions noted or rashes, Psychiatry: Mood is appropriate for condition and setting   Data Reviewed: CBC: Recent Labs  Lab 11/10/22 0555  WBC 7.0  HGB 7.5*  HCT 23.8*  MCV 93.7  PLT 174   Basic Metabolic Panel: Recent Labs  Lab 11/05/22 0323 11/06/22 0422 11/07/22 0524 11/10/22 0555 11/10/22 1431 11/11/22 0337  NA 146* 145 140 140 139 143  K 4.4 4.0 4.9 6.0* 5.7* 5.6*  CL 113* 110 104 106 106 107  CO2 25 27 29 28 24 29    GLUCOSE 222* 129* 258* 190* 149* 63*  BUN 41* 39* 43* 64* 56* 63*  CREATININE 1.27* 1.22* 1.22* 1.39* 1.31* 1.42*  CALCIUM 8.3* 7.5* 7.5* 8.2* 7.9* 8.1*  MG 2.1 2.0  --   --   --   --   PHOS 1.9* 2.4* 3.4  --   --   --    GFR: Estimated Creatinine Clearance: 43.3 mL/min (A) (by C-G formula based on SCr of 1.42 mg/dL (H)). Liver Function Tests: Recent Labs  Lab 11/06/22 0422 11/10/22 0555  AST 20 30  ALT 10 16  ALKPHOS 50 46  BILITOT 0.4 0.6  PROT 5.3* 5.5*  ALBUMIN 3.1* 3.1*   No results for input(s): "LIPASE", "AMYLASE" in the last 168 hours. No results for input(s): "AMMONIA" in the last 168 hours. Coagulation Profile: No results for input(s): "INR", "PROTIME" in the last 168 hours. Cardiac Enzymes: No results for input(s): "CKTOTAL", "CKMB", "CKMBINDEX", "TROPONINI" in the last 168 hours. BNP (last 3 results) No results for input(s): "PROBNP" in the last 8760 hours. HbA1C: No results for input(s): "HGBA1C" in the last 72 hours. CBG: Recent Labs  Lab 11/11/22 0407 11/11/22 0446 11/11/22 0934 11/11/22 1146 11/11/22 1637  GLUCAP 60* 99 296* 252* 75   Lipid Profile: No results for input(s): "CHOL", "HDL", "LDLCALC", "TRIG", "CHOLHDL", "LDLDIRECT" in the last 72 hours. Thyroid Function Tests: No results for input(s): "TSH", "T4TOTAL", "FREET4", "T3FREE", "THYROIDAB" in the last 72 hours. Anemia Panel: No results for input(s): "VITAMINB12", "FOLATE", "FERRITIN", "TIBC", "IRON", "RETICCTPCT" in the last 72 hours. Urine analysis:    Component Value Date/Time   COLORURINE YELLOW (A) 11/02/2022 0418   APPEARANCEUR CLOUDY (A) 11/02/2022 0418   APPEARANCEUR Clear 09/18/2020 1153   LABSPEC 1.039 (H) 11/02/2022 0418   LABSPEC 1.026 03/01/2013 1844   PHURINE 5.0 11/02/2022 0418   GLUCOSEU 150 (A) 11/02/2022 0418   GLUCOSEU >=500 03/01/2013 1844   HGBUR NEGATIVE 11/02/2022 0418   BILIRUBINUR NEGATIVE 11/02/2022 0418   BILIRUBINUR neg 10/23/2020 0953   BILIRUBINUR  Negative 09/18/2020 1153   BILIRUBINUR Negative 03/01/2013 1844   KETONESUR NEGATIVE 11/02/2022 0418   PROTEINUR NEGATIVE 11/02/2022 0418   UROBILINOGEN 0.2 10/23/2020 0953   NITRITE NEGATIVE 11/02/2022 0418   LEUKOCYTESUR LARGE (A) 11/02/2022 0418   LEUKOCYTESUR Negative 03/01/2013 1844   Sepsis Labs: @LABRCNTIP (procalcitonin:4,lacticidven:4)  ) Recent Results (from the past 240 hour(s))  Urine Culture (for pregnant, neutropenic or urologic patients or patients with an indwelling urinary catheter)  Status: None   Collection Time: 11/02/22  4:18 AM   Specimen: Urine, Clean Catch  Result Value Ref Range Status   Specimen Description   Final    URINE, CLEAN CATCH Performed at [facility], [address] [place], [place] [address]    Special Requests   Final    NONE Performed at [facility], [address] [place], [place] [address]    Culture   Final    NO GROWTH Performed at [facility], [address], [place], [place] [address]    Report Status 11/03/2022 FINAL  Final      Studies: No results found.  Scheduled Meds:  divalproex  250 mg Oral Daily   feeding supplement (GLUCERNA 1.5 CAL)  780 mL Per Tube Q24H   ferrous sulfate  325 mg Oral Q breakfast   FLUoxetine  10 mg Oral Daily   free water  30 mL Per Tube Q4H   hydrocortisone  20 mg Oral Daily   And   hydrocortisone  10 mg Oral QHS   insulin aspart  0-6 Units Subcutaneous Q4H   insulin aspart  2 Units Subcutaneous Q4H   insulin glargine-yfgn  2 Units Subcutaneous BH-q7a   insulin glargine-yfgn  2 Units Subcutaneous QHS   levothyroxine  50 mcg Oral QAC breakfast   LORazepam  0.5 mg Oral Q8H   methocarbamol  500 mg Oral TID   multivitamin with minerals  1 tablet Oral Daily   sodium zirconium cyclosilicate  10 g Oral BID   thiamine  100 mg Oral Daily    Continuous Infusions:  sodium chloride 75 mL/hr at 11/11/22 1615   promethazine (PHENERGAN) injection (IM or IVPB)  Stopped (11/04/22 0757)     LOS: 10 days     Darlin Drop, MD [facility] Pager [phone]  If 7PM-7AM, please contact night-coverage [email or web address] Password [facility] 11/11/2022, 5:53 PM

## 2022-11-11 NOTE — Progress Notes (Signed)
Patient blood sugar was 60mg /dL, 2 cups of orange juice with sugar added was given, hypoglycemia protocol has been activated. NP B. Jon Billings, notified,will continue to monitor.

## 2022-11-12 ENCOUNTER — Inpatient Hospital Stay: Payer: Medicaid Other

## 2022-11-12 DIAGNOSIS — K3184 Gastroparesis: Secondary | ICD-10-CM | POA: Diagnosis not present

## 2022-11-12 DIAGNOSIS — D649 Anemia, unspecified: Secondary | ICD-10-CM

## 2022-11-12 DIAGNOSIS — N1831 Chronic kidney disease, stage 3a: Secondary | ICD-10-CM | POA: Diagnosis not present

## 2022-11-12 DIAGNOSIS — R600 Localized edema: Secondary | ICD-10-CM | POA: Diagnosis not present

## 2022-11-12 LAB — GLUCOSE, CAPILLARY
Glucose-Capillary: 171 mg/dL — ABNORMAL HIGH (ref 70–99)
Glucose-Capillary: 176 mg/dL — ABNORMAL HIGH (ref 70–99)
Glucose-Capillary: 238 mg/dL — ABNORMAL HIGH (ref 70–99)
Glucose-Capillary: 259 mg/dL — ABNORMAL HIGH (ref 70–99)
Glucose-Capillary: 264 mg/dL — ABNORMAL HIGH (ref 70–99)
Glucose-Capillary: 87 mg/dL (ref 70–99)

## 2022-11-12 LAB — TYPE AND SCREEN: Antibody Screen: NEGATIVE

## 2022-11-12 LAB — BASIC METABOLIC PANEL
Anion gap: 7 (ref 5–15)
BUN: 60 mg/dL — ABNORMAL HIGH (ref 6–20)
CO2: 26 mmol/L (ref 22–32)
Calcium: 8 mg/dL — ABNORMAL LOW (ref 8.9–10.3)
Chloride: 106 mmol/L (ref 98–111)
Creatinine, Ser: 1.37 mg/dL — ABNORMAL HIGH (ref 0.44–1.00)
GFR, Estimated: 53 mL/min — ABNORMAL LOW (ref >60)
Glucose, Bld: 165 mg/dL — ABNORMAL HIGH (ref 70–99)
Potassium: 4.9 mmol/L (ref 3.5–5.1)
Sodium: 139 mmol/L (ref 135–145)

## 2022-11-12 LAB — RETICULOCYTES
Immature Retic Fract: 22.1 % — ABNORMAL HIGH (ref 2.3–15.9)
RBC.: 2.24 MIL/uL — ABNORMAL LOW (ref 3.87–5.11)
Retic Count, Absolute: 132.6 10*3/uL (ref 19.0–186.0)
Retic Ct Pct: 5.9 % — ABNORMAL HIGH (ref 0.4–3.1)

## 2022-11-12 LAB — IRON AND TIBC
Iron: 51 ug/dL (ref 28–170)
Saturation Ratios: 34 % — ABNORMAL HIGH (ref 10.4–31.8)
TIBC: 151 ug/dL — ABNORMAL LOW (ref 250–450)
UIBC: 100 ug/dL

## 2022-11-12 LAB — HEMOGLOBIN AND HEMATOCRIT, BLOOD
HCT: 21.2 % — ABNORMAL LOW (ref 36.0–46.0)
Hemoglobin: 6.7 g/dL — ABNORMAL LOW (ref 12.0–15.0)

## 2022-11-12 LAB — CBC
HCT: 21.8 % — ABNORMAL LOW (ref 36.0–46.0)
Hemoglobin: 6.9 g/dL — ABNORMAL LOW (ref 12.0–15.0)
MCH: 30.5 pg (ref 26.0–34.0)
MCHC: 31.7 g/dL (ref 30.0–36.0)
MCV: 96.5 fL (ref 80.0–100.0)
Platelets: 187 10*3/uL (ref 150–400)
RBC: 2.26 MIL/uL — ABNORMAL LOW (ref 3.87–5.11)
RDW: 13.2 % (ref 11.5–15.5)
WBC: 6.6 10*3/uL (ref 4.0–10.5)
nRBC: 0 % (ref 0.0–0.2)

## 2022-11-12 LAB — FERRITIN: Ferritin: 486 ng/mL — ABNORMAL HIGH (ref 11–307)

## 2022-11-12 LAB — PHOSPHORUS: Phosphorus: 2.9 mg/dL (ref 2.5–4.6)

## 2022-11-12 LAB — MAGNESIUM: Magnesium: 2.1 mg/dL (ref 1.7–2.4)

## 2022-11-12 LAB — PREPARE RBC (CROSSMATCH)

## 2022-11-12 MED ORDER — SODIUM CHLORIDE 0.9% IV SOLUTION: Freq: Once | INTRAVENOUS | Status: AC

## 2022-11-12 MED ORDER — GLUCERNA 1.5 CAL PO LIQD
800.0000 mL | ORAL | Status: DC
  Administered 2022-11-12: 800 mL

## 2022-11-12 NOTE — Progress Notes (Signed)
Nutrition Follow-up  DOCUMENTATION CODES:   Underweight, Severe malnutrition in context of social or environmental circumstances  INTERVENTION:   -Continue regular diet -D/c Nepro -Nocturnal TF via j-tube:    Glucerna 1.5 @ 80 ml/hr x 10 hours   30 ml free water flush every 4 hours   Tube feeding regimen provides 1200 kcal (75% of needs), 66 grams of protein, and 607 ml of H2O. Total free water: 787 ml daily  NUTRITION DIAGNOSIS:   Severe Malnutrition related to social / environmental circumstances as evidenced by moderate fat depletion, severe fat depletion, moderate muscle depletion, severe muscle depletion, percent weight loss.  Ongoing  GOAL:   Patient will meet greater than or equal to 90% of their needs  Progressing   MONITOR:   PO intake, Supplement acceptance, TF tolerance  REASON FOR ASSESSMENT:   Consult Assessment of nutrition requirement/status  ASSESSMENT:   Pt with insulin-dependent diabetes mellitus type 1, severe gastroparesis, severe malnutrition, status post jejunostomy tube feed, adrenal insufficiency, CKD stage IIIb, hypertension, hypothyroid, who presents for left-sided abdominal pain, nausea, vomiting and dehydration.  Reviewed I/O's: +2.5 L x 24 hours and +12.4 L since admission  Spoke with pt at bedside, who was eating breakfast at time of visit. Pt reports abdominal pain has improved and she is eating more. Noted meal completions 50-100%. Pt shares her anxiety medications are working.    Pt shares improvement in diarrhea. She shares that she is concerned about her TF and her ability to do this at home. Pt shares that she has a one year old with a very erratic sleep schedule, so it will be difficulty for her to do her TF for a prolonged period of time. RD discussed potential of shortening TF to 8-10 hours per day. Pt still unsure about the feasibility of this; RD offered option of potentially being able to make up infusion time throughout the day  during child's nap time, etc. Per pt, this may be feasible.   Case discussed with MD. TF formula was changed to Nepro secondary to hyperkalemia. RD does not think that elevated K is coming from TF. Suspect dehydration related to fluid losses from diarrhea. Pt has improved with IV fluids.   Medications reviewed and include ferrous sulfate, ativan, thiamine, and 0.9% sodium chloride infusion @ 75 ml/hr.   Labs reviewed: CBGS: 176 (inpatient orders for glycemic control are 0-6 units insulin aspart every 4 hours, 2 units insulin aspart every 4 hours, 2 units insulin aspart every 4 hours, and 2 units insulin glargine BID).    Diet Order:   Diet Order             Diet regular Fluid consistency: Thin  Diet effective now                   EDUCATION NEEDS:   Education needs have been addressed  Skin:  Skin Assessment: Reviewed RN Assessment  Last BM:  11/11/22 (type 6)  Height:   Ht Readings from Last 1 Encounters:  11/01/22 5\' 1"  (1.549 m)    Weight:   Wt Readings from Last 1 Encounters:  11/07/22 49.5 kg    Ideal Body Weight:  47.7 kg  BMI:  Body mass index is 20.62 kg/m.  Estimated Nutritional Needs:   Kcal:  1600-1800  Protein:  85-100 grams  Fluid:  > 1.6 L    Levada Schilling, RD, LDN, CDCES Registered Dietitian II Certified Diabetes Care and Education Specialist Please refer to [facility] for  RD and/or RD on-call/weekend/after hours pager

## 2022-11-12 NOTE — Consult Note (Addendum)
[facility]  Telephone:([phone]) [phone] Fax:(336) [phone]  ID: [name] OB: [birth year]  MR#: [id number]  VOZ#:[id number]  Patient Care Team: Pcp, No as PCP - General Croitoru, Mihai, MD as PCP - Cardiology (Cardiology) Leafy Ro, MD as Consulting Physician (General Surgery)  REFERRING PROVIDER: Dr. Margo Aye  REASON FOR REFERRAL: Normocytic anemia  ASSESSMENT AND PLAN:   JENNIAH BHAVSAR is a 32 y.o. female  with pmh of insulin-dependent diabetes type 1, severe gastroparesis status post jejunostomy tube feed, adrenal insufficiency, CKD stage IIIb, hypertension, hypothyroid, history of right upper extremity DVT presented to ED on 11/01/2022 as advised by general surgeon for left-sided abdominal pain, nausea and vomiting.  # Normocytic anemia - She has anemia dating back to 2019 has been getting progressively worse.  Hemoglobin ranges anywhere between 6-9.  MCV is normal.  WBC and platelets are normal.  Iron panel from 11/12/2022 showed ferritin 486, saturation 34%, low TIBC of 151, iron 51.  Reticulocyte is mildly elevated to 5.9%.  Hemoglobin today is 6.9 and she is planned for 1 unit PRBC.  Patient was evaluated by Dr. Cathie Hoops in 2022 for same condition.  LDH normal, normal bilirubin.  SPEP/IFE no M spike.  Hemoglobin electrophoresis was normal.  Received 1 dose of IV Venofer.  Her B12 was borderline low and was started on B12 supplementation.  Zinc was low and was started on zinc supplementation.  Of chart, if above workup comes back negative plan was to perform a bone marrow biopsy.  Patient was lost to follow-up after that.  I will recheck her LDH, haptoglobin, LFTs, SPEP/IFE, zinc, B1, B6.  Vitamin B12 level from 07/27/2022 is 623.  Folate normal at 18.5.  She has CKD stage III which can also contribute to anemia.  I will also check EPO level.  Patient was explained that there may be a need for bone marrow biopsy which can be arranged as outpatient if needed.  Dr. Cathie Hoops to  arrange an outpatient follow-up visit.   Patient expressed understanding and was in agreement with this plan. She also understands that She can call clinic at any time with any questions, concerns, or complaints.   I spent a total of 60 minutes reviewing chart data, face-to-face evaluation with the patient, counseling and coordination of care as detailed above.    HPI: CAYDEN RAUTIO is a 32 y.o. female with pmh of insulin-dependent diabetes type 1, severe gastroparesis status post jejunostomy tube feed, adrenal insufficiency, CKD stage IIIb, hypertension, hypothyroid, history of right upper extremity DVT presented to ED on 11/01/2022 as advised by general surgeon for left-sided abdominal pain, nausea and vomiting.  CT abdomen pelvis showed a jejunostomy tube in appropriate position and no acute findings.  Hematology was consulted for chronic unexplained anemia.  She has anemia dating back to 2019 has been getting progressively worse.  Hemoglobin ranges anywhere between 6-9.  MCV is normal.  WBC and platelets are normal.  Iron panel from 11/12/2022 showed ferritin 486, saturation 34%, low TIBC of 151, iron 51.  Reticulocyte is mildly elevated to 5.9%.  Hemoglobin today is 6.9 and she is planned for 1 unit PRBC.  Patient was evaluated by Dr. Cathie Hoops in 2022 for same condition.  LDH normal, normal bilirubin.  SPEP/IFE no M spike.  Hemoglobin electrophoresis was normal.  Received 1 dose of IV Venofer.  Her B12 was borderline low and was started on B12 supplementation.  Zinc was low and was started on zinc supplementation.  Of  chart, if above workup comes back negative plan was to perform a bone marrow biopsy.  Patient was lost to follow-up after that.    REVIEW OF SYSTEMS:   ROS  As per HPI. Otherwise, a complete review of systems is negative.  PAST MEDICAL HISTORY: Past Medical History:  Diagnosis Date   Acute metabolic encephalopathy 06/10/2022   Adrenal insufficiency (HCC)    Anemia     Gastroparesis    Hypertension    Type 1 diabetes (HCC)     PAST SURGICAL HISTORY: Past Surgical History:  Procedure Laterality Date   BIOPSY  01/14/2021   Procedure: BIOPSY;  Surgeon: Tressia Danas, MD;  Location: [facility] ENDOSCOPY;  Service: Gastroenterology;;   CESAREAN SECTION     CESAREAN SECTION WITH BILATERAL TUBAL LIGATION N/A 02/17/2021   Procedure: CESAREAN SECTION WITH BILATERAL TUBAL LIGATION;  Surgeon: [name], DO;  Location: [facility] LD ORS;  Service: Obstetrics;  Laterality: N/A;   ESOPHAGOGASTRODUODENOSCOPY (EGD) WITH PROPOFOL N/A 01/14/2021   Procedure: ESOPHAGOGASTRODUODENOSCOPY (EGD) WITH PROPOFOL;  Surgeon: Tressia Danas, MD;  Location: [facility] ENDOSCOPY;  Service: Gastroenterology;  Laterality: N/A;   JEJUNOSTOMY N/A 06/13/2022   Procedure: JEJUNOSTOMY;  Surgeon: Leafy Ro, MD;  Location: [facility] ORS;  Service: General;  Laterality: N/A;   MOUTH SURGERY      FAMILY HISTORY: Family History  Problem Relation Age of Onset   Diabetes type I Father    CAD Father    CAD Paternal Grandmother    CAD Paternal Grandfather    Breast cancer Mother    Ovarian cancer Neg Hx     HEALTH MAINTENANCE: Social History   Tobacco Use   Smoking status: Never    Passive exposure: Never   Smokeless tobacco: Never  Vaping Use   Vaping Use: Never used  Substance Use Topics   Alcohol use: No   Drug use: No     No Known Allergies  Current Facility-Administered Medications  Medication Dose Route Frequency Provider Last Rate Last Admin   0.9 %  sodium chloride infusion (Manually program via Guardrails IV Fluids)   Intravenous Once [place], Carole N, DO       0.9 %  sodium chloride infusion   Intravenous Continuous Darlin Drop, DO 75 mL/hr at 11/12/22 0953 New Bag at 11/12/22 0953   acetaminophen (TYLENOL) 160 MG/5ML solution 500 mg  500 mg Per Tube Q6H PRN Sunnie Nielsen, DO   500 mg at 11/09/22 1023   divalproex (DEPAKOTE ER) 24 hr tablet 250 mg  250 mg Oral Daily Cox,  Amy N, DO   250 mg at 11/12/22 0948   feeding supplement (GLUCERNA 1.5 CAL) liquid 800 mL  800 mL Per Tube Continuous Dow Adolph N, DO       ferrous sulfate tablet 325 mg  325 mg Oral Q breakfast Cox, Amy N, DO   325 mg at 11/12/22 0827   FLUoxetine (PROZAC) capsule 10 mg  10 mg Oral Daily Bennett, Christal H, NP   10 mg at 11/12/22 0955   free water 30 mL  30 mL Per Tube Q4H Sunnie Nielsen, DO   30 mL at 11/12/22 8119   hydrocortisone (ANUSOL-HC) suppository 25 mg  25 mg Rectal BID PRN Sunnie Nielsen, DO       hydrocortisone (CORTEF) tablet 20 mg  20 mg Oral Daily Cox, Amy N, DO   20 mg at 11/12/22 0954   And   hydrocortisone (CORTEF) tablet 10 mg  10 mg Oral QHS  Cox, Amy N, DO   10 mg at 11/11/22 2114   insulin aspart (novoLOG) injection 0-6 Units  0-6 Units Subcutaneous Q4H Sunnie Nielsen, DO   1 Units at 11/12/22 7829   insulin aspart (novoLOG) injection 2 Units  2 Units Subcutaneous Q4H Sunnie Nielsen, DO   2 Units at 11/12/22 5621   insulin glargine-yfgn [facility]) injection 2 Units  2 Units Subcutaneous Oscar La, DO   2 Units at 11/12/22 3086   insulin glargine-yfgn [facility]) injection 2 Units  2 Units Subcutaneous QHS Sunnie Nielsen, DO   2 Units at 11/11/22 2114   levothyroxine (SYNTHROID) tablet 50 mcg  50 mcg Oral QAC breakfast Cox, Amy N, DO   50 mcg at 11/12/22 5784   loperamide (IMODIUM) capsule 4 mg  4 mg Oral PRN Sunnie Nielsen, DO   4 mg at 11/09/22 0553   LORazepam (ATIVAN) tablet 0.5 mg  0.5 mg Oral Q8H Sunnie Nielsen, DO   0.5 mg at 11/12/22 0827   methocarbamol (ROBAXIN) tablet 500 mg  500 mg Oral TID Sunnie Nielsen, DO   500 mg at 11/12/22 6962   multivitamin with minerals tablet 1 tablet  1 tablet Oral Daily Sunnie Nielsen, DO   1 tablet at 11/12/22 0948   promethazine (PHENERGAN) 12.5 mg in sodium chloride 0.9 % 50 mL IVPB  12.5 mg Intravenous Q6H PRN Sunnie Nielsen, DO   Stopped at 11/04/22 0757   senna-docusate  (Senokot-S) tablet 1 tablet  1 tablet Oral QHS PRN Cox, Amy N, DO       thiamine (VITAMIN B1) tablet 100 mg  100 mg Oral Daily Sunnie Nielsen, DO   100 mg at 11/12/22 0948    OBJECTIVE: Vitals:   11/12/22 0008 11/12/22 0829  BP: (!) 147/78 (!) 143/77  Pulse: 80 81  Resp: 18 17  Temp: 97.7 F (36.5 C) 98.2 F (36.8 C)  SpO2: 100% 100%     Body mass index is 20.62 kg/m.      General: Well-developed, well-nourished, no acute distress. Eyes: Pink conjunctiva, anicteric sclera. HEENT: Normocephalic, moist mucous membranes, clear oropharnyx. Lungs: Clear to auscultation bilaterally. Heart: Regular rate and rhythm. No rubs, murmurs, or gallops. Abdomen: Soft, nontender, nondistended. No organomegaly noted, normoactive bowel sounds. Musculoskeletal: No edema, cyanosis, or clubbing. Neuro: Alert, answering all questions appropriately. Cranial nerves grossly intact. Skin: No rashes or petechiae noted. Psych: Normal affect. Lymphatics: No cervical, calvicular, axillary or inguinal LAD.   LAB RESULTS:  Lab Results  Component Value Date   NA 139 11/12/2022   K 4.9 11/12/2022   CL 106 11/12/2022   CO2 26 11/12/2022   GLUCOSE 165 (H) 11/12/2022   BUN 60 (H) 11/12/2022   CREATININE 1.37 (H) 11/12/2022   CALCIUM 8.0 (L) 11/12/2022   PROT 5.5 (L) 11/10/2022   ALBUMIN 3.1 (L) 11/10/2022   AST 30 11/10/2022   ALT 16 11/10/2022   ALKPHOS 46 11/10/2022   BILITOT 0.6 11/10/2022   GFRNONAA 53 (L) 11/12/2022   GFRAA >60 05/30/2019    Lab Results  Component Value Date   WBC 6.6 11/12/2022   NEUTROABS 3.6 10/01/2022   HGB 6.7 (L) 11/12/2022   HCT 21.2 (L) 11/12/2022   MCV 96.5 11/12/2022   PLT 187 11/12/2022    Lab Results  Component Value Date   TIBC 151 (L) 11/12/2022   TIBC 160 (L) 07/27/2022   TIBC 146 (L) 06/09/2022   FERRITIN 486 (H) 11/12/2022   FERRITIN 952 (H) 07/27/2022  FERRITIN 1,511 (H) 06/09/2022   IRONPCTSAT 34 (H) 11/12/2022   IRONPCTSAT 46 (H)  07/27/2022   IRONPCTSAT 68 (H) 06/09/2022     STUDIES: US Venous Img Lower Bilateral (DVT)  Result Date: 11/12/2022 CLINICAL DATA:  Bilateral lower extremity edema. EXAM: BILATERAL LOWER EXTREMITY VENOUS DOPPLER ULTRASOUND TECHNIQUE: Gray-scale sonography with graded compression, as well as color Doppler and duplex ultrasound were performed to evaluate the lower extremity deep venous systems from the level of the common femoral vein and including the common femoral, femoral, profunda femoral, popliteal and calf veins including the posterior tibial, peroneal and gastrocnemius veins when visible. The superficial great saphenous vein was also interrogated. Spectral Doppler was utilized to evaluate flow at rest and with distal augmentation maneuvers in the common femoral, femoral and popliteal veins. COMPARISON:  11/21/2020 FINDINGS: RIGHT LOWER EXTREMITY Common Femoral Vein: No evidence of thrombus. Normal compressibility, respiratory phasicity and response to augmentation. Saphenofemoral Junction: No evidence of thrombus. Normal compressibility and flow on color Doppler imaging. Profunda Femoral Vein: No evidence of thrombus. Normal compressibility and flow on color Doppler imaging. Femoral Vein: No evidence of thrombus. Normal compressibility, respiratory phasicity and response to augmentation. Popliteal Vein: No evidence of thrombus. Normal compressibility, respiratory phasicity and response to augmentation. Calf Veins: No evidence of thrombus. Normal compressibility and flow on color Doppler imaging. Superficial Great Saphenous Vein: No evidence of thrombus. Normal compressibility. Venous Reflux:  None. Other Findings: No evidence of superficial thrombophlebitis or abnormal fluid collection. LEFT LOWER EXTREMITY Common Femoral Vein: No evidence of thrombus. Normal compressibility, respiratory phasicity and response to augmentation. Saphenofemoral Junction: No evidence of thrombus. Normal compressibility and  flow on color Doppler imaging. Profunda Femoral Vein: No evidence of thrombus. Normal compressibility and flow on color Doppler imaging. Femoral Vein: No evidence of thrombus. Normal compressibility, respiratory phasicity and response to augmentation. Popliteal Vein: No evidence of thrombus. Normal compressibility, respiratory phasicity and response to augmentation. Calf Veins: No evidence of thrombus. Normal compressibility and flow on color Doppler imaging. Superficial Great Saphenous Vein: No evidence of thrombus. Normal compressibility. Venous Reflux:  None. Other Findings: No evidence of superficial thrombophlebitis or abnormal fluid collection. IMPRESSION: No evidence of deep venous thrombosis in either lower extremity. Electronically Signed   By: Irish Lack M.D.   On: 11/12/2022 15:14   DG ABDOMEN PEG TUBE LOCATION  Result Date: 11/02/2022 CLINICAL DATA:  [id number] Malnutrition (HCC) [id number] EXAM: ABDOMEN - 1 VIEW COMPARISON:  11/01/2022 CT FINDINGS: 30 mL of Gastrografin was injected into patient's existing percutaneous jejunostomy tube. Contrast is seen filling nondilated loops of proximal jejunum. No extraluminal contrast collections. Bowel gas pattern is nonobstructive. There is excreted contrast within the urinary bladder. No gross free intraperitoneal air on supine view. IMPRESSION: Percutaneous jejunostomy tube appears appropriately positioned. Electronically Signed   By: Duanne Guess D.O.   On: 11/02/2022 11:02   CT ABDOMEN PELVIS W CONTRAST  Result Date: 11/01/2022 CLINICAL DATA:  Left-sided abdominal pain. Nausea and vomiting. Dehydration. Jejunostomy tube. 10 lb weight loss in past month. EXAM: CT ABDOMEN AND PELVIS WITH CONTRAST TECHNIQUE: Multidetector CT imaging of the abdomen and pelvis was performed using the standard protocol following bolus administration of intravenous contrast. RADIATION DOSE REDUCTION: This exam was performed according to the departmental dose-optimization  program which includes automated exposure control, adjustment of the mA and/or kV according to patient size and/or use of iterative reconstruction technique. CONTRAST:  75mL OMNIPAQUE IOHEXOL 300 MG/ML  SOLN COMPARISON:  10/01/2022 FINDINGS: Lower Chest: No acute findings.  Hepatobiliary: No hepatic masses identified. Gallbladder is unremarkable. No evidence of biliary ductal dilatation. Pancreas:  No mass or inflammatory changes. Spleen: Within normal limits in size and appearance. Adrenals/Urinary Tract: No suspicious masses identified. No evidence of ureteral calculi or hydronephrosis. Stable mild diffuse bladder wall thickening, suspicious for cystitis. Stomach/Bowel: Jejunostomy tube in appropriate position. Evidence of free intraperitoneal air. No evidence of bowel obstruction, inflammatory process or abscess. Vascular/Lymphatic: No pathologically enlarged lymph nodes. No acute vascular findings. Reproductive: Uterus is unremarkable. No adnexal masses identified. Small amount of free fluid in the dependent pelvis shows no significant change since previous study, and is most likely physiologic. Other:  None. Musculoskeletal:  No suspicious bone lesions identified. IMPRESSION: Jejunostomy tube in appropriate position. No evidence of bowel obstruction or other acute findings. Stable mild diffuse bladder wall thickening, suspicious for cystitis. Suggest correlation with urinalysis. Electronically Signed   By: Danae Orleans M.D.   On: 11/01/2022 19:01      Michaelyn Barter, MD   11/12/2022 3:34 PM

## 2022-11-12 NOTE — Plan of Care (Signed)
  Problem: Education: Goal: Knowledge of General Education information will improve Description: Including pain rating scale, medication(s)/side effects and non-pharmacologic comfort measures Outcome: Progressing   Problem: Health Behavior/Discharge Planning: Goal: Ability to manage health-related needs will improve Outcome: Progressing   Problem: Clinical Measurements: Goal: Ability to maintain clinical measurements within normal limits will improve Outcome: Progressing Goal: Diagnostic test results will improve Outcome: Progressing   Problem: Nutrition: Goal: Adequate nutrition will be maintained Outcome: Progressing   Problem: Coping: Goal: Level of anxiety will decrease Outcome: Progressing   Problem: Elimination: Goal: Will not experience complications related to bowel motility Outcome: Progressing Goal: Will not experience complications related to urinary retention Outcome: Progressing   Problem: Pain Managment: Goal: General experience of comfort will improve Outcome: Progressing
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
## 2022-11-12 NOTE — Plan of Care (Signed)

## 2022-11-13 DIAGNOSIS — R627 Adult failure to thrive: Secondary | ICD-10-CM | POA: Diagnosis not present

## 2022-11-13 DIAGNOSIS — R739 Hyperglycemia, unspecified: Secondary | ICD-10-CM | POA: Diagnosis not present

## 2022-11-13 DIAGNOSIS — R112 Nausea with vomiting, unspecified: Secondary | ICD-10-CM | POA: Diagnosis not present

## 2022-11-13 HISTORY — DX: Nausea with vomiting, unspecified: R11.2

## 2022-11-13 LAB — TYPE AND SCREEN
ABO/RH(D): A POS
Unit division: 0

## 2022-11-13 LAB — CBC WITH DIFFERENTIAL/PLATELET
Abs Immature Granulocytes: 0.09 10*3/uL — ABNORMAL HIGH (ref 0.00–0.07)
Basophils Absolute: 0 10*3/uL (ref 0.0–0.1)
Basophils Relative: 1 %
Eosinophils Absolute: 0.2 10*3/uL (ref 0.0–0.5)
Eosinophils Relative: 3 %
HCT: 26.5 % — ABNORMAL LOW (ref 36.0–46.0)
Hemoglobin: 8.7 g/dL — ABNORMAL LOW (ref 12.0–15.0)
Immature Granulocytes: 1 %
Lymphocytes Relative: 15 %
Lymphs Abs: 1.1 10*3/uL (ref 0.7–4.0)
MCH: 29.9 pg (ref 26.0–34.0)
MCHC: 32.8 g/dL (ref 30.0–36.0)
MCV: 91.1 fL (ref 80.0–100.0)
Monocytes Absolute: 0.5 10*3/uL (ref 0.1–1.0)
Monocytes Relative: 7 %
Neutro Abs: 5.1 10*3/uL (ref 1.7–7.7)
Neutrophils Relative %: 73 %
Platelets: 205 10*3/uL (ref 150–400)
RBC: 2.91 MIL/uL — ABNORMAL LOW (ref 3.87–5.11)
RDW: 15.1 % (ref 11.5–15.5)
WBC: 7.1 10*3/uL (ref 4.0–10.5)
nRBC: 0 % (ref 0.0–0.2)

## 2022-11-13 LAB — BASIC METABOLIC PANEL
Anion gap: 8 (ref 5–15)
BUN: 56 mg/dL — ABNORMAL HIGH (ref 6–20)
CO2: 25 mmol/L (ref 22–32)
Calcium: 7.8 mg/dL — ABNORMAL LOW (ref 8.9–10.3)
Chloride: 108 mmol/L (ref 98–111)
Creatinine, Ser: 1.28 mg/dL — ABNORMAL HIGH (ref 0.44–1.00)
GFR, Estimated: 57 mL/min — ABNORMAL LOW (ref >60)
Glucose, Bld: 108 mg/dL — ABNORMAL HIGH (ref 70–99)
Potassium: 4.7 mmol/L (ref 3.5–5.1)
Sodium: 141 mmol/L (ref 135–145)

## 2022-11-13 LAB — BPAM RBC
Blood Product Expiration Date: 202407162359
ISSUE DATE / TIME: 202406211707
Unit Type and Rh: 6200

## 2022-11-13 LAB — HEPATIC FUNCTION PANEL
ALT: 28 U/L (ref 0–44)
AST: 39 U/L (ref 15–41)
Albumin: 2.6 g/dL — ABNORMAL LOW (ref 3.5–5.0)
Alkaline Phosphatase: 58 U/L (ref 38–126)
Bilirubin, Direct: <0.1 mg/dL (ref 0.0–0.2)
Total Bilirubin: 0.6 mg/dL (ref 0.3–1.2)
Total Protein: 4.8 g/dL — ABNORMAL LOW (ref 6.5–8.1)

## 2022-11-13 LAB — GLUCOSE, CAPILLARY
Glucose-Capillary: 150 mg/dL — ABNORMAL HIGH (ref 70–99)
Glucose-Capillary: 152 mg/dL — ABNORMAL HIGH (ref 70–99)
Glucose-Capillary: 214 mg/dL — ABNORMAL HIGH (ref 70–99)
Glucose-Capillary: 278 mg/dL — ABNORMAL HIGH (ref 70–99)
Glucose-Capillary: 331 mg/dL — ABNORMAL HIGH (ref 70–99)
Glucose-Capillary: 97 mg/dL (ref 70–99)

## 2022-11-13 LAB — LACTATE DEHYDROGENASE: LDH: 189 U/L (ref 98–192)

## 2022-11-13 MED ORDER — AMLODIPINE BESYLATE 10 MG PO TABS
10.0000 mg | ORAL_TABLET | Freq: Every day | ORAL | Status: DC
  Administered 2022-11-13 – 2022-11-15 (×3): 10 mg via ORAL
  Filled 2022-11-13 (×3): qty 1

## 2022-11-13 MED ORDER — HEPARIN SODIUM (PORCINE) 5000 UNIT/ML IJ SOLN
5000.0000 [IU] | Freq: Three times a day (TID) | INTRAMUSCULAR | Status: DC
  Filled 2022-11-13 (×2): qty 1

## 2022-11-13 MED ORDER — SENNA 8.6 MG PO TABS: 1.0000 | ORAL_TABLET | Freq: Every day | ORAL | Status: DC | PRN

## 2022-11-13 MED ORDER — GLUCERNA 1.5 CAL PO LIQD
800.0000 mL | Freq: Every day | ORAL | Status: DC
  Administered 2022-11-13 – 2022-11-14 (×2): 800 mL

## 2022-11-13 MED ORDER — INSULIN ASPART 100 UNIT/ML IJ SOLN
0.0000 [IU] | Freq: Three times a day (TID) | INTRAMUSCULAR | Status: DC
  Administered 2022-11-13: 4 [IU] via SUBCUTANEOUS
  Administered 2022-11-13 – 2022-11-14 (×2): 3 [IU] via SUBCUTANEOUS
  Administered 2022-11-14: 2 [IU] via SUBCUTANEOUS
  Administered 2022-11-14: 5 [IU] via SUBCUTANEOUS
  Administered 2022-11-14 – 2022-11-15 (×2): 4 [IU] via SUBCUTANEOUS
  Administered 2022-11-15: 6 [IU] via SUBCUTANEOUS
  Filled 2022-11-13 (×8): qty 1

## 2022-11-13 NOTE — Progress Notes (Signed)
Pts BP 170/86, recheck 163/90. Pt states she "takes Amlodipine at home". MD Dareen Piano notified, MD Placed orders.

## 2022-11-13 NOTE — Plan of Care (Signed)
  Problem: Education: Goal: Knowledge of General Education information will improve Description: Including pain rating scale, medication(s)/side effects and non-pharmacologic comfort measures Outcome: Progressing   Problem: Health Behavior/Discharge Planning: Goal: Ability to manage health-related needs will improve Outcome: Progressing   Problem: Clinical Measurements: Goal: Diagnostic test results will improve Outcome: Progressing   Problem: Activity: Goal: Risk for activity intolerance will decrease Outcome: Progressing   Problem: Nutrition: Goal: Adequate nutrition will be maintained Outcome: Progressing   Problem: Elimination: Goal: Will not experience complications related to bowel motility Outcome: Progressing Goal: Will not experience complications related to urinary retention Outcome: Progressing   Problem: Pain Managment: Goal: General experience of comfort will improve Outcome: Progressing   

## 2022-11-13 NOTE — Progress Notes (Signed)
Pt refused Heparin, pt educated. MD Dareen Piano notified. No orders received.

## 2022-11-13 NOTE — Plan of Care (Signed)
  Problem: Activity: Goal: Risk for activity intolerance will decrease Outcome: Progressing   Problem: Nutrition: Goal: Adequate nutrition will be maintained Outcome: Progressing   Problem: Safety: Goal: Ability to remain free from injury will improve Outcome: Progressing   

## 2022-11-13 NOTE — Progress Notes (Signed)
Patient refused the scheduled Heparin, patient educated. DR. Doylene Canard notified.
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
## 2022-11-14 DIAGNOSIS — R739 Hyperglycemia, unspecified: Secondary | ICD-10-CM | POA: Diagnosis not present

## 2022-11-14 DIAGNOSIS — R627 Adult failure to thrive: Secondary | ICD-10-CM | POA: Diagnosis not present

## 2022-11-14 DIAGNOSIS — R112 Nausea with vomiting, unspecified: Secondary | ICD-10-CM | POA: Diagnosis not present

## 2022-11-14 LAB — CBC
HCT: 26.8 % — ABNORMAL LOW (ref 36.0–46.0)
Hemoglobin: 8.6 g/dL — ABNORMAL LOW (ref 12.0–15.0)
MCH: 29.7 pg (ref 26.0–34.0)
MCHC: 32.1 g/dL (ref 30.0–36.0)
MCV: 92.4 fL (ref 80.0–100.0)
Platelets: 187 10*3/uL (ref 150–400)
RBC: 2.9 MIL/uL — ABNORMAL LOW (ref 3.87–5.11)
RDW: 15.1 % (ref 11.5–15.5)
WBC: 7.8 10*3/uL (ref 4.0–10.5)
nRBC: 0 % (ref 0.0–0.2)

## 2022-11-14 LAB — BASIC METABOLIC PANEL
Anion gap: 7 (ref 5–15)
BUN: 63 mg/dL — ABNORMAL HIGH (ref 6–20)
CO2: 22 mmol/L (ref 22–32)
Calcium: 8 mg/dL — ABNORMAL LOW (ref 8.9–10.3)
Chloride: 108 mmol/L (ref 98–111)
Creatinine, Ser: 1.24 mg/dL — ABNORMAL HIGH (ref 0.44–1.00)
GFR, Estimated: 60 mL/min — ABNORMAL LOW (ref >60)
Glucose, Bld: 396 mg/dL — ABNORMAL HIGH (ref 70–99)
Potassium: 5.6 mmol/L — ABNORMAL HIGH (ref 3.5–5.1)
Sodium: 137 mmol/L (ref 135–145)

## 2022-11-14 LAB — GLUCOSE, CAPILLARY
Glucose-Capillary: 358 mg/dL — ABNORMAL HIGH (ref 70–99)
Glucose-Capillary: 220 mg/dL — ABNORMAL HIGH (ref 70–99)
Glucose-Capillary: 298 mg/dL — ABNORMAL HIGH (ref 70–99)
Glucose-Capillary: 326 mg/dL — ABNORMAL HIGH (ref 70–99)

## 2022-11-14 MED ORDER — ACETAMINOPHEN 500 MG PO TABS
500.0000 mg | ORAL_TABLET | Freq: Once | ORAL | Status: AC
  Administered 2022-11-14: 500 mg via ORAL
  Filled 2022-11-14: qty 1

## 2022-11-14 MED ORDER — INSULIN GLARGINE-YFGN 100 UNIT/ML ~~LOC~~ SOLN
5.0000 [IU] | Freq: Every day | SUBCUTANEOUS | Status: DC
  Administered 2022-11-14: 5 [IU] via SUBCUTANEOUS
  Filled 2022-11-14 (×2): qty 0.05

## 2022-11-14 MED ORDER — LORAZEPAM 0.5 MG PO TABS
0.5000 mg | ORAL_TABLET | Freq: Three times a day (TID) | ORAL | Status: DC | PRN
  Administered 2022-11-14: 0.5 mg via ORAL
  Filled 2022-11-14: qty 1

## 2022-11-14 MED ORDER — SODIUM ZIRCONIUM CYCLOSILICATE 5 G PO PACK
5.0000 g | PACK | Freq: Two times a day (BID) | ORAL | Status: AC
  Administered 2022-11-14 (×2): 5 g via ORAL
  Filled 2022-11-14 (×2): qty 1

## 2022-11-14 MED ORDER — ZINC OXIDE 40 % EX OINT
TOPICAL_OINTMENT | Freq: Three times a day (TID) | CUTANEOUS | Status: DC
  Filled 2022-11-14: qty 113

## 2022-11-14 NOTE — Plan of Care (Signed)

## 2022-11-14 NOTE — Plan of Care (Signed)
  Problem: Coping: Goal: Ability to adjust to condition or change in health will improve Outcome: Progressing   Problem: Nutritional: Goal: Maintenance of adequate nutrition will improve Outcome: Progressing Goal: Progress toward achieving an optimal weight will improve Outcome: Progressing   Problem: Skin Integrity: Goal: Risk for impaired skin integrity will decrease Outcome: Progressing   Problem: Elimination: Goal: Will not experience complications related to bowel motility Outcome: Progressing   Problem: Pain Managment: Goal: General experience of comfort will improve Outcome: Progressing   Problem: Safety: Goal: Ability to remain free from injury will improve Outcome: Progressing

## 2022-11-14 NOTE — Progress Notes (Signed)
PROGRESS NOTE  Ashley Camacho:[id number] DOB: 1991/02/23 DOA: 11/01/2022 PCP: Ashley Camacho, No  Hospital course: Ashley Camacho is a 31 year old female with insulin-dependent diabetes mellitus type 1, severe gastroparesis, severe malnutrition, status post jejunostomy tube feed, adrenal insufficiency, CKD stage IIIb, hypertension, hypothyroid, history of right upper extremity DVT, no longer on anticoagulation, who presents to the emergency department at the advice of general surgeon for left-sided abdominal pain, nausea, vomiting and dehydration. Per general surgeon, patient tries to use the jejunostomy tube but it hurts. 06/10: hyperkalemic in ED. Admitted to hospitalist service w/ gen surg to consult.  06/11: General surgery - ok for slow Jtube feeds (CT scan as well as a Gastrografin contrast via the J tube - no extravasation of contrast, no complication or abscess intra-abdominal) and recs for BH eval. Pt unable to take po at this time but still very labile Glc.  06/12: continuing tube feeds for now. Psych - restart Ashley Camacho.  06/13: TOC following w/ DSS. Concern for DV, see RN note from this morning. Working on getting her some resources. Worse abdominal pain today, unable to take any po but tube feeds going ok.  06/14-06/15: better po intake. Tube feeds at 50 mL/h, will try increasing  06/16: increased rate caused substantial pain. Trial lower rate today and higher po intake, pt hopeful she might feel ok for discharge soon  06/17: substantial pain w/ po intake or tube feeds higher than 50 mL/h (tried at 100 and 75 overnight) requiring opiate pain control. Bentyl not helpful yesterday. Will trial Reglan and d/c morphine. Check TSH - WNL. [facility], spoke w/ nurse manager who will ask about getting patient reestablished.  06/18: still severe pain. Encouraged ambulation.  06/19: pain improved w/ benzo therapy. K elevated, restarted IV fluids.  06/20: Received Lokelma for  hyperkalemia, tolerating tube feeding with a rate of 65 cc/h x 12 hours/day. 6/21:The patient was seen and examined at the bedside.  Hemoglobin dropped to 6.9.  States she is no longer having regular menses.  No menses for months, had her fallopian tubes removed.  She takes iron supplements at home.  Endorses unilateral right lower extremity edema for which Doppler ultrasound was ordered. 6/22-6/24: stable. Continue adjusting medications for glucose and blood pressure control. Monitoring nutrition intake and J-tube status.  Assessment/Plan: Active Problems:   Gastroparesis   Adrenal insufficiency (HCC)   Hypothyroidism   Anxiety   Brittle diabetes (HCC)   Diabetic sensorimotor neuropathy (HCC)   Diabetic retinopathy (HCC)   Hyperkalemia   CKD stage 3 due to type 1 diabetes mellitus (HCC)   Essential hypertension   Adrenal cortical hypofunction (HCC)   Protein-calorie malnutrition, severe (HCC)   CKD stage 3a, GFR 45-59 ml/min (HCC)   Elevated serum creatinine   Abdominal pain   Cystitis   Severe recurrent major depression (HCC)   Normocytic anemia   Failure to thrive in adult   Hyperglycemia   Intractable nausea and vomiting  Protein-calorie malnutrition, severe (HCC) Registered dietitian following, appreciate assistance. Want to discuss minimal oral calorie intake requirement per day so can decrease tube feeds per patient comfort. Also if can be adjusted to help prevent further hyperkalemia Currently on tube feedings and also oral intake.  Normocytic anemia- etiology unclear. Suspect related to nutritional deficiencies. No known blood loss. Received 1u pRBCs 6/21. Hgb 6.7>8.7>8.6 - hematology following  Right lower extremity edema  generalized edema- LE doppler negative for DVT 6/21. Patient has h/o congestive heart failure and has  been on IV fluids as well as continuous tube feeds with generalized edema. Now that she's able to tolerate oral fluids, stopped IVF.  - monitor  fluid status - strict I/O - encourage PO hydration.    Resolved refractory hyperkalemia- K+ 4.7>5.6 - hold lokelma   Type 1 diabetes mellitus (HCC) labile Insulin long-acting split BID Insulin SSI with meals and QHS Goal inpatient blood glucose levels 140-180 Very labile Glc, chronic  Has been d/c from [facility] endocrine d/t no-shows, but this was result of transportation issues outside her control, hopefully can get her set up w/ endocrinology again once [facility] ensures transportation plan.    Abdominal pain  pain at site of jejunostomy  Gastroparesis, chronic- General surgery consulted - jejunostomy tube functioning well without extravasation of contrast on CT study. Granulation tissue at insertion site of tube.  Pain control PRN Desitin to help prevent skin break down   Elevated serum creatinine/AKI on CKD3a - improved/resolved  Has history of needing dialysis in remote past. She currently seems to be at baseline. Cr 1.28 Strict I's and O's   Essential hypertension- pressure are elevated. Her pressures are very labile with treatment and changes in her intake Continue to monitor vital signs. Continue amlodipine   GAD, MDD  Ativan 0.5 mg IV every 6 hours as needed for anxiety Psychiatry consulted here to address anxiety/depression and concern for eating disorder - Ashley Camacho started, continue current   Hypothyroidism, TSH WNL Levothyroxine 50 mcg daily before breakfast resumed   Adrenal insufficiency Patient had not taken her Cortef over the last 2 days PTA due to nausea Resumed home Cortef 20 mg in the morning and 10 mg at with supper   Cystitis / UTI ruled out  Ceftriaxone was given x2 doses pending urine culture --> UCx NG and no symptoms, will d/c abx    Difficult social situation Lives w/ father of her kids but he is not helpful, they are essentially living separately he is upstairs she is downstairs w/ kids. Young child learning to walk, grabs at her feeding tubes, difficult  to administer her feeds at hom. Her mom helps some w/ transportation but limited ability to help watch kids. Father of her kids and she do not have a good relationship, she reports he is using her disability funds for himself and has threatened to not let her move back in when she leaves the hospital.     DVT prophylaxis: refusing SCD and lovenox/heparin  Pertinent IV fluids/nutrition: diabetic diet as tolerated, tube feeds nightly  Central lines / invasive devices: J-tube   Code Status: FULL CODE  ACP documentation reviewed: 11/02/22 none on file    Current Admission Status: inpatient   TOC needs / Dispo plan: TBD, expect home to previous home environment +/- HH  Barriers to discharge / significant pending items: hopefully will be able to d/c home soon, pending hematology evaluation.    Status is: Inpatient  Objective: Vitals:   11/13/22 1538 11/13/22 1624 11/13/22 1626 11/14/22 0004  BP: (!) 170/86  (!) 163/90 139/81  Pulse: 78 81  99  Resp: 18   18  Temp: 98.1 F (36.7 C)   98.1 F (36.7 C)  TempSrc:      SpO2: 100% 100%  100%  Weight:      Height:        Intake/Output Summary (Last 24 hours) at 11/14/2022 0718 Last data filed at 11/13/2022 1428 Gross per 24 hour  Intake 510 ml  Output --  Net 510 ml    Filed Weights   11/05/22 0703 11/06/22 0700 11/07/22 0425  Weight: 47.6 kg 48.3 kg 49.5 kg   Exam:  General: 32 y.o. year-old female frail-appearing in no acute distress.  She is alert oriented x 3.   Cardiovascular: Regular rate and rhythm with no rubs or gallops.  No thyromegaly or JVD noted.   Respiratory: Clear to auscultation with no wheezes or rales. Good inspiratory effort. Abdomen: Soft nontender nondistended with normal bowel sounds x4 quadrants. J-tube with granulation at opening and mild oozing Musculoskeletal:trace lower extremity edema. Moving all limbs spontaneously Skin: No lesions on exposed skin. Incision as described above Psychiatry: Mood is  appropriate for condition and setting  Data Reviewed: CBC: Recent Labs  Lab 11/10/22 0555 11/12/22 0546 11/12/22 1143 11/13/22 0642 11/14/22 0426  WBC 7.0 6.6  --  7.1 7.8  NEUTROABS  --   --   --  5.1  --   HGB 7.5* 6.9* 6.7* 8.7* 8.6*  HCT 23.8* 21.8* 21.2* 26.5* 26.8*  MCV 93.7 96.5  --  91.1 92.4  PLT 174 187  --  205 187    Basic Metabolic Panel: Recent Labs  Lab 11/10/22 1431 11/11/22 0337 11/12/22 0546 11/13/22 0642 11/14/22 0426  NA 139 143 139 141 137  K 5.7* 5.6* 4.9 4.7 5.6*  CL 106 107 106 108 108  CO2 24 29 26 25 22   GLUCOSE 149* 63* 165* 108* 396*  BUN 56* 63* 60* 56* 63*  CREATININE 1.31* 1.42* 1.37* 1.28* 1.24*  CALCIUM 7.9* 8.1* 8.0* 7.8* 8.0*  MG  --   --  2.1  --   --   PHOS  --   --  2.9  --   --     GFR: Estimated Creatinine Clearance: 49.6 mL/min (A) (by C-G formula based on SCr of 1.24 mg/dL (H)). Liver Function Tests: Recent Labs  Lab 11/10/22 0555 11/13/22 0642  AST 30 39  ALT 16 28  ALKPHOS 46 58  BILITOT 0.6 0.6  PROT 5.5* 4.8*  ALBUMIN 3.1* 2.6*    CBG: Recent Labs  Lab 11/13/22 0401 11/13/22 0749 11/13/22 1138 11/13/22 1658 11/13/22 2107  GLUCAP 152* 97 150* 278* 331*    Recent Labs    11/12/22 0546  FERRITIN 486*  TIBC 151*  IRON 51  RETICCTPCT 5.9*    Urine analysis:    Component Value Date/Time   COLORURINE YELLOW (A) 11/02/2022 0418   APPEARANCEUR CLOUDY (A) 11/02/2022 0418   APPEARANCEUR Clear 09/18/2020 1153   LABSPEC 1.039 (H) 11/02/2022 0418   LABSPEC 1.026 03/01/2013 1844   PHURINE 5.0 11/02/2022 0418   GLUCOSEU 150 (A) 11/02/2022 0418   GLUCOSEU >=500 03/01/2013 1844   HGBUR NEGATIVE 11/02/2022 0418   BILIRUBINUR NEGATIVE 11/02/2022 0418   BILIRUBINUR neg 10/23/2020 0953   BILIRUBINUR Negative 09/18/2020 1153   BILIRUBINUR Negative 03/01/2013 1844   KETONESUR NEGATIVE 11/02/2022 0418   PROTEINUR NEGATIVE 11/02/2022 0418   UROBILINOGEN 0.2 10/23/2020 0953   NITRITE NEGATIVE 11/02/2022  0418   LEUKOCYTESUR LARGE (A) 11/02/2022 0418   LEUKOCYTESUR Negative 03/01/2013 1844   Studies: No results found.  Scheduled Meds:  amLODipine  10 mg Oral Daily   divalproex  250 mg Oral Daily   feeding supplement (GLUCERNA 1.5 CAL)  800 mL Per Tube QHS   ferrous sulfate  325 mg Oral Q breakfast   FLUoxetine  10 mg Oral Daily   free water  30 mL Per Tube Q4H  heparin  5,000 Units Subcutaneous Q8H   hydrocortisone  20 mg Oral Daily   And   hydrocortisone  10 mg Oral QHS   insulin aspart  0-6 Units Subcutaneous TID PC & HS   insulin glargine-yfgn  2 Units Subcutaneous BH-q7a   insulin glargine-yfgn  2 Units Subcutaneous QHS   levothyroxine  50 mcg Oral QAC breakfast   LORazepam  0.5 mg Oral Q8H   methocarbamol  500 mg Oral TID   multivitamin with minerals  1 tablet Oral Daily   thiamine  100 mg Oral Daily    Continuous Infusions:  promethazine (PHENERGAN) injection (IM or IVPB) Stopped (11/04/22 0757)     LOS: 13 days     Leeroy Bock, MD [facility] Pager [phone]  If 7PM-7AM, please contact night-coverage [email or web address] Password [facility] 11/14/2022, 7:18 AM

## 2022-11-15 DIAGNOSIS — R112 Nausea with vomiting, unspecified: Secondary | ICD-10-CM | POA: Diagnosis not present

## 2022-11-15 DIAGNOSIS — R627 Adult failure to thrive: Secondary | ICD-10-CM | POA: Diagnosis not present

## 2022-11-15 DIAGNOSIS — R739 Hyperglycemia, unspecified: Secondary | ICD-10-CM | POA: Diagnosis not present

## 2022-11-15 LAB — CBC
HCT: 26.5 % — ABNORMAL LOW (ref 36.0–46.0)
Hemoglobin: 8.5 g/dL — ABNORMAL LOW (ref 12.0–15.0)
MCH: 29.7 pg (ref 26.0–34.0)
MCHC: 32.1 g/dL (ref 30.0–36.0)
MCV: 92.7 fL (ref 80.0–100.0)
Platelets: 190 10*3/uL (ref 150–400)
RBC: 2.86 MIL/uL — ABNORMAL LOW (ref 3.87–5.11)
RDW: 14.7 % (ref 11.5–15.5)
WBC: 6.5 10*3/uL (ref 4.0–10.5)
nRBC: 0 % (ref 0.0–0.2)

## 2022-11-15 LAB — GLUCOSE, CAPILLARY
Glucose-Capillary: 447 mg/dL — ABNORMAL HIGH (ref 70–99)
Glucose-Capillary: 311 mg/dL — ABNORMAL HIGH (ref 70–99)

## 2022-11-15 LAB — BASIC METABOLIC PANEL
Anion gap: 7 (ref 5–15)
BUN: 61 mg/dL — ABNORMAL HIGH (ref 6–20)
CO2: 27 mmol/L (ref 22–32)
Calcium: 8.4 mg/dL — ABNORMAL LOW (ref 8.9–10.3)
Chloride: 106 mmol/L (ref 98–111)
Creatinine, Ser: 1.25 mg/dL — ABNORMAL HIGH (ref 0.44–1.00)
GFR, Estimated: 59 mL/min — ABNORMAL LOW (ref >60)
Glucose, Bld: 446 mg/dL — ABNORMAL HIGH (ref 70–99)
Potassium: 5.4 mmol/L — ABNORMAL HIGH (ref 3.5–5.1)
Sodium: 140 mmol/L (ref 135–145)

## 2022-11-15 LAB — HAPTOGLOBIN: Haptoglobin: 156 mg/dL (ref 33–278)

## 2022-11-15 LAB — ERYTHROPOIETIN: Erythropoietin: 11.3 m[IU]/mL (ref 2.6–18.5)

## 2022-11-15 MED ORDER — INSULIN ASPART 100 UNIT/ML IJ SOLN: 0.0000 [IU] | Freq: Three times a day (TID) | INTRAMUSCULAR | 0 refills | Status: DC

## 2022-11-15 MED ORDER — ADULT MULTIVITAMIN W/MINERALS CH: 1.0000 | ORAL_TABLET | Freq: Every day | ORAL | Status: DC

## 2022-11-15 MED ORDER — AMLODIPINE BESYLATE 10 MG PO TABS: 10.0000 mg | ORAL_TABLET | Freq: Every day | ORAL | 0 refills | Status: DC

## 2022-11-15 MED ORDER — FREE WATER: 100.0000 mL | Status: DC

## 2022-11-15 MED ORDER — LOPERAMIDE HCL 2 MG PO CAPS: 4.0000 mg | ORAL_CAPSULE | ORAL | 0 refills | Status: DC | PRN

## 2022-11-15 MED ORDER — SODIUM ZIRCONIUM CYCLOSILICATE 10 G PO PACK
10.0000 g | PACK | Freq: Two times a day (BID) | ORAL | Status: DC
  Administered 2022-11-15: 10 g via ORAL
  Filled 2022-11-15 (×2): qty 1

## 2022-11-15 MED ORDER — GLUCERNA 1.5 CAL PO LIQD: 640.0000 mL | Freq: Every day | ORAL | 0 refills | Status: DC

## 2022-11-15 MED ORDER — GLUCERNA 1.5 CAL PO LIQD: 640.0000 mL | Freq: Every day | ORAL | Status: DC

## 2022-11-15 MED ORDER — BASAGLAR KWIKPEN 100 UNIT/ML ~~LOC~~ SOPN: 5.0000 [IU] | PEN_INJECTOR | Freq: Two times a day (BID) | SUBCUTANEOUS | 0 refills | Status: DC

## 2022-11-15 MED ORDER — INSULIN GLARGINE-YFGN 100 UNIT/ML ~~LOC~~ SOLN: 5.0000 [IU] | SUBCUTANEOUS | Status: DC

## 2022-11-15 MED ORDER — VITAMIN B-1 100 MG PO TABS: 100.0000 mg | ORAL_TABLET | Freq: Every day | ORAL | 0 refills | Status: AC

## 2022-11-15 MED ORDER — INSULIN GLARGINE-YFGN 100 UNIT/ML ~~LOC~~ SOLN
5.0000 [IU] | SUBCUTANEOUS | Status: DC
  Administered 2022-11-15: 5 [IU] via SUBCUTANEOUS
  Filled 2022-11-15: qty 0.05

## 2022-11-15 MED ORDER — METHOCARBAMOL 500 MG PO TABS: 500.0000 mg | ORAL_TABLET | Freq: Two times a day (BID) | ORAL | 0 refills | Status: DC | PRN

## 2022-11-15 MED ORDER — INSULIN ASPART 100 UNIT/ML IJ SOLN
2.0000 [IU] | Freq: Three times a day (TID) | INTRAMUSCULAR | Status: DC
  Administered 2022-11-15: 2 [IU] via SUBCUTANEOUS
  Filled 2022-11-15: qty 1

## 2022-11-15 NOTE — TOC Progression Note (Addendum)
Transition of Care [facility]) - Progression Note    Patient Details  Name: MARCIEL OFFENBERGER MRN: [id number] Date of Birth: [birth year]  Transition of [facility]) CM/SW Contact  Garret Reddish, RN Phone Number: 11/15/2022, 10:19 AM  Clinical Narrative:   Chart reviewed.  Noted that patient have medication adjustment for glucose and BP control. Inpatient diabetes Coordinator consulting on patient.  Continuing to monitor nutrition intake and J-tube status.  TOC will continue to follow for discharge planning.     Expected Discharge Plan:  (Pending Medical work up) Barriers to Discharge:  (Needs assistance with transportation for discharge appointments)  Expected Discharge Plan and Services   Discharge Planning Services: CM Consult                                           Social Determinants of Health (SDOH) Interventions SDOH Screenings   Food Insecurity: No Food Insecurity (11/01/2022)  Housing: Low Risk  (11/01/2022)  Transportation Needs: No Transportation Needs (11/01/2022)  Utilities: Not At Risk (11/01/2022)  Depression (PHQ2-9): Low Risk  (11/11/2020)  Tobacco Use: Low Risk  (11/01/2022)    Readmission Risk Interventions    11/03/2021   10:37 AM 06/16/2021   12:30 PM 06/03/2021    4:26 PM  Readmission Risk Prevention Plan  Transportation Screening Complete Complete Complete  Medication Review Oceanographer) Complete Complete Complete  PCP or Specialist appointment within 3-5 days of discharge Complete Complete Complete  HRI or Home Care Consult Not Complete  Not Complete  HRI or Home Care Consult Pt Refusal Comments NA  NA  SW Recovery Care/Counseling Consult Not Complete Complete Complete  SW Consult Not Complete Comments NA    Palliative Care Screening Not Applicable Not Applicable Not Applicable  Skilled Nursing Facility Not Applicable Not Applicable Not Applicable
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
## 2022-11-15 NOTE — Inpatient Diabetes Management (Signed)
Inpatient Diabetes Program Recommendations  AACE/ADA: New Consensus Statement on Inpatient Glycemic Control   Target Ranges:  Prepandial:   less than 140 mg/dL      Peak postprandial:   less than 180 mg/dL (1-2 hours)      Critically ill patients:  140 - 180 mg/dL    Latest Reference Range & Units 11/14/22 07:56 11/14/22 11:26 11/14/22 16:38 11/14/22 21:13 11/15/22 07:59  Glucose-Capillary 70 - 99 mg/dL 161 (H)  Novolog 5 units  Semglee 2 units @6 :40  220 (H)  Novolog 2 units 298 (H)  Novolog 3 units 326 (H)  Novolog 4 units  Semglee 5 units 447 (H)  Novolog 6 units     Review of Glycemic Control  Diabetes history: DM1 (does NOT make any insulin; requires basal, correction, and carb coverage insulin; very sensitive to insulin) Outpatient Diabetes medications: Basaglar 4 units BID, Novolog 2 units TID with meals plus correction Current orders for Inpatient glycemic control: Semglee 5 units BID, Novolog 0-6 units  AC&HS; Cortef 20 mg QAM, Cortef 10 mg QPM, Glucerna @ 80 ml/hr at bedtime (x10 hours)  Inpatient Diabetes Program Recommendations:    Insulin: Please consider ordering Novolog 2 units TID with meals for meal coverage if patient eats at least 50% of meals. Also consider ordering Novolog tube feeding coverage; recommend Novolog 2 units BID (12 AM and 4AM).  Thanks, Orlando Penner, RN, MSN, CDCES Diabetes Coordinator Inpatient Diabetes Program [phone] (Team Pager from 8am to 5pm)

## 2022-11-15 NOTE — Progress Notes (Signed)
Nutrition Follow-up  DOCUMENTATION CODES:   Underweight, Severe malnutrition in context of social or environmental circumstances  INTERVENTION:   -Carb modified diet -Nocturnal TF via j-tube:    Glucerna 1.5 @ 80 ml/hr x 8 hours   100 ml free water flush every 4 hours   Tube feeding regimen provides 960 kcal (75% of needs), 54 grams of protein, and 486 ml of H2O. Total free water: 1120 ml daily  NUTRITION DIAGNOSIS:   Severe Malnutrition related to social / environmental circumstances as evidenced by moderate fat depletion, severe fat depletion, moderate muscle depletion, severe muscle depletion, percent weight loss.  Ongoing  GOAL:   Patient will meet greater than or equal to 90% of their needs  Progressing   MONITOR:   PO intake, Supplement acceptance, TF tolerance  REASON FOR ASSESSMENT:   Consult Assessment of nutrition requirement/status  ASSESSMENT:   Pt with insulin-dependent diabetes mellitus type 1, severe gastroparesis, severe malnutrition, status post jejunostomy tube feed, adrenal insufficiency, CKD stage IIIb, hypertension, hypothyroid, who presents for left-sided abdominal pain, nausea, vomiting and dehydration.  Reviewed I/O's: +480 ml x 24 hours and +14.9 L since admission  UOP: 0 ml x 24 hours   Case discussed with MD, who reports concern over K levels and feasability of TF regimen at home. Likely plan for discharge today.   Spoke with pt at bedside, who reports feeling better today. She has had no difficulty tolerating her TF. She reports eating better, but still has abdominal pain, but it manageable with medications. Pt reports she is eating most of her food, but notices that certain foods aggravate her symptoms more than others. Per pt, she consumed egg and cheese omelette and mac and cheese yesterday, which caused pain and diarrhea. Pt think that when she transitions home, her meal intake will be different, as she usually snacks vs receiving larger  meals in the hospital. Noted meal completions 60-100%  6/21 Breakfast: nothing recorded Lunch: 545 kcals, 20 grams protein Dinner: 693 kcals and 27 grams protein  Total intake: 1238 kcal (77% of minimum estimated needs)  47 grams protein (55% of minimum estimated needs)  6/22 Breakfast: nothing recorded Lunch: 860 kcals, 32 grams protein Dinner: 609 kcals, 23 grams protein  Total intake: 1469 kcal (92% of minimum estimated needs)  55 grams protein (65% of minimum estimated needs)  6/23 Breakfast: 647 kcals, 20 grams protein Lunch: 860 kcals, 32 grams protein Dinner: 822 kcals, 30 grams protein  Total intake: 2329 kcal (100% of minimum estimated needs)  82 grams protein (100% of minimum estimated needs)  Average Total intake: 1688 kcal (100% of minimum estimated needs)  61 grams protein (72% of minimum estimated needs)  Discussed with pt ability to do TF at home- discussed shorter time and potential to "make up" TF at a later time during the day. She is agreeable to this. RD also reinforced gastroparesis diet and reviewed appropriate foods for home.   Medications reviewed and include ferrous sulfate, thiamine, and lokelma.    Labs reviewed: K: 5.4, CBGS: 311-447 (inpatient orders for glycemic control are 0-6 units insulin aspart TID after meals and at bedtime, 2 units insulin aspart TID with meals, 5 units insulin glargine-yfgn each morning, and 5 units insulin glargine-yfgn daily).    Diet Order:   Diet Order             Diet regular Fluid consistency: Thin  Diet effective now  EDUCATION NEEDS:   Education needs have been addressed  Skin:  Skin Assessment: Reviewed RN Assessment  Last BM:  11/11/22 (type 6)  Height:   Ht Readings from Last 1 Encounters:  11/01/22 5\' 1"  (1.549 m)    Weight:   Wt Readings from Last 1 Encounters:  11/07/22 49.5 kg    Ideal Body Weight:  47.7 kg  BMI:  Body mass index is 20.62 kg/m.  Estimated  Nutritional Needs:   Kcal:  1600-1800  Protein:  85-100 grams  Fluid:  > 1.6 L    Levada Schilling, RD, LDN, CDCES Registered Dietitian II Certified Diabetes Care and Education Specialist Please refer to [facility] for RD and/or RD on-call/weekend/after hours pager

## 2022-11-16 LAB — ZINC: Zinc: 34 ug/dL — ABNORMAL LOW (ref 44–115)

## 2022-11-16 LAB — VITAMIN B1: Vitamin B1 (Thiamine): 197.2 nmol/L (ref 66.5–200.0)

## 2022-11-17 LAB — MULTIPLE MYELOMA PANEL, SERUM
Albumin SerPl Elph-Mcnc: 2.5 g/dL — ABNORMAL LOW (ref 2.9–4.4)
Albumin/Glob SerPl: 1.4 (ref 0.7–1.7)
Alpha 1: 0.2 g/dL (ref 0.0–0.4)
Alpha2 Glob SerPl Elph-Mcnc: 0.6 g/dL (ref 0.4–1.0)
B-Globulin SerPl Elph-Mcnc: 0.6 g/dL — ABNORMAL LOW (ref 0.7–1.3)
Gamma Glob SerPl Elph-Mcnc: 0.5 g/dL (ref 0.4–1.8)
Globulin, Total: 1.9 g/dL — ABNORMAL LOW (ref 2.2–3.9)
IgA: 129 mg/dL (ref 87–352)
IgG (Immunoglobin G), Serum: 574 mg/dL — ABNORMAL LOW (ref 586–1602)
IgM (Immunoglobulin M), Srm: 71 mg/dL (ref 26–217)
Total Protein ELP: 4.4 g/dL — ABNORMAL LOW (ref 6.0–8.5)

## 2022-11-18 LAB — VITAMIN B6: Vitamin B6: 17.5 ug/L (ref 3.4–65.2)

## 2022-11-19 ENCOUNTER — Inpatient Hospital Stay: Payer: Medicaid Other | Attending: Oncology | Admitting: Oncology

## 2022-11-19 ENCOUNTER — Encounter: Payer: Self-pay | Admitting: Oncology

## 2022-11-19 ENCOUNTER — Telehealth: Payer: Self-pay

## 2022-11-19 ENCOUNTER — Inpatient Hospital Stay: Payer: Medicaid Other

## 2022-11-19 VITALS — BP 134/81 | HR 93 | Temp 98.0°F | Resp 18 | Wt 107.1 lb

## 2022-11-19 DIAGNOSIS — D649 Anemia, unspecified: Secondary | ICD-10-CM | POA: Insufficient documentation

## 2022-11-19 DIAGNOSIS — E43 Unspecified severe protein-calorie malnutrition: Secondary | ICD-10-CM | POA: Insufficient documentation

## 2022-11-19 DIAGNOSIS — K3184 Gastroparesis: Secondary | ICD-10-CM | POA: Insufficient documentation

## 2022-11-19 DIAGNOSIS — E60 Dietary zinc deficiency: Secondary | ICD-10-CM

## 2022-11-19 DIAGNOSIS — Z86718 Personal history of other venous thrombosis and embolism: Secondary | ICD-10-CM | POA: Diagnosis not present

## 2022-11-19 NOTE — Telephone Encounter (Signed)
Request for STAT bone marrow biopsy faxed to IR.   Will need to see MD 2 weeks after biopsy

## 2022-11-20 DIAGNOSIS — I82409 Acute embolism and thrombosis of unspecified deep veins of unspecified lower extremity: Secondary | ICD-10-CM | POA: Insufficient documentation

## 2022-11-20 MED ORDER — ZINC GLUCONATE 50 MG PO TABS: 50.0000 mg | ORAL_TABLET | Freq: Every day | ORAL | 2 refills | Status: DC

## 2022-11-20 NOTE — Assessment & Plan Note (Signed)
Previous labs were reviewed and discussed with patient. Most of workup is negative except zinc deficiency. Anemia is chronic, possibly due to chronic disease. I recommend a bone marrow biopsy to rule out intrinsic bone marrow disorders. Patient agrees with the plan.

## 2022-11-20 NOTE — Assessment & Plan Note (Addendum)
Follow-up with endocrinologist for diabetes control.  Antiemetics as needed.

## 2022-11-20 NOTE — Assessment & Plan Note (Signed)
History of DVT.  Currently off anticoagulation.

## 2022-11-20 NOTE — Assessment & Plan Note (Addendum)
Recommend  zinc gluconate supplementation 50mg  daily per tube.

## 2022-11-20 NOTE — Progress Notes (Signed)
Hematology/Oncology Progress note [phone] [phone]     Patient Care Team: Pcp, No as PCP - General Croitoru, Mihai, MD as PCP - Cardiology (Cardiology) Leafy Ro, MD as Consulting Physician (General Surgery)    Date of visit: 11/20/22  REASON FOR VISIT:  Anemia, posthospitalization follow-up ASSESSMENT & PLAN:   Normocytic anemia Previous labs were reviewed and discussed with patient. Most of workup is negative except zinc deficiency. Anemia is chronic, possibly due to chronic disease. I recommend a bone marrow biopsy to rule out intrinsic bone marrow disorders. Patient agrees with the plan.  Zinc deficiency Recommend  zinc gluconate supplementation 50mg  daily per tube.   Protein-calorie malnutrition, severe (HCC) Continue tube feeding and follow-up with nutritionist.  Gastroparesis Follow-up with endocrinologist for diabetes control.  Antiemetics as needed.  DVT (deep venous thrombosis) (HCC) History of DVT.  Currently off anticoagulation.   Orders Placed This Encounter  Procedures   IR BONE MARROW BIOPSY & ASPIRATION    Standing Status:   Future    Standing Expiration Date:   11/19/2023    Order Specific Question:   Reason for Exam (SYMPTOM  OR DIAGNOSIS REQUIRED)    Answer:   anemia    Order Specific Question:   Is the patient pregnant?    Answer:   No    Order Specific Question:   Preferred Imaging Location?    Answer:    [facility]   Follow-up 2 weeks after bone marrow biopsy. All questions were answered. The patient knows to call the clinic with any problems, questions or concerns.  Rickard Patience, MD, PhD [facility] Hematology Oncology 11/19/2022   INTERVAL HISTORY-   32 y.o. female with past medical history including diabetes type 1, severe gastroparesis, severe malnutrition, status post jejunostomy tube, adrenal insufficiency, CKD stage IIIb, hypertension, hypothyroidism, history of right upper extremity DVT, off  anticoagulation, chronic anemia presents to reestablish care. Patient was last seen by me in July 2022.  Patient had a workup done at that time and there was plan for bone marrow biopsy and she lost to follow-up.  11/01/2022 - 11/15/2022  Patient was admitted due to gastroparesis, left-sided abdominal pain, nausea vomiting dehydration.  Hematology was consulted for anemia, hemoglobin dropped to 6.9.  Patient was seen by my colleague.  Workup showed normal LDH, normal haptoglobin.  Negative M protein on SPEP.    Normal B1, B6.  Normal folate level.  Erythropoietin level 11.3. She had a normal B12 level in March 2023.  she has a low zinc level 34. Patient received PRBC transfusion  Today patient was accompanied by her mother to establish care.  She continues to feel tired. She uses her J-tube for feeding.  She continues to have chronic left-sided abdominal pain.   Review of systems- Review of Systems  Constitutional:  Positive for appetite change and fatigue.  Eyes:  Negative for icterus.  Respiratory:  Negative for shortness of breath.   Cardiovascular:  Negative for chest pain.  Gastrointestinal:  Positive for abdominal pain. Negative for nausea and vomiting.  Genitourinary:  Negative for difficulty urinating.   Musculoskeletal:  Negative for arthralgias.  Skin:  Negative for rash.  Neurological:  Negative for headaches.  Hematological:  Negative for adenopathy.  Psychiatric/Behavioral:  Negative for confusion.     No Known Allergies  Patient Active Problem List   Diagnosis Date Noted   Failure to thrive in adult 11/13/2022   Hyperglycemia 11/13/2022   Intractable nausea and vomiting 11/13/2022  Normocytic anemia 11/12/2022   Severe recurrent major depression (HCC) 11/03/2022   CKD stage 3a, GFR 45-59 ml/min (HCC) 11/01/2022   Elevated serum creatinine 11/01/2022   Abdominal pain 11/01/2022   Cystitis 11/01/2022   Acute exacerbation of congestive heart failure (HCC) 08/23/2022    Diabetic ketoacidosis (HCC) 08/23/2022   Hypothermia 07/27/2022   Stage 3b chronic kidney disease (HCC) 06/29/2022   Hypovolemic shock (HCC) 06/28/2022   Jejunostomy tube present (HCC) 06/28/2022   Insomnia 06/20/2022   Hypophosphatemia 06/13/2022   Severe malnutrition (HCC) 06/13/2022   Hypotension 06/10/2022   Pseudohyponatremia 06/10/2022   Hypomagnesemia 06/10/2022   Adrenal insufficiency (HCC) 06/09/2022   Acute gastroenteritis 02/28/2022   Hypokalemia 02/28/2022   Hypocalcemia 02/28/2022   Metabolic acidosis 02/28/2022   Essential hypertension 02/28/2022   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   Nausea & vomiting 02/28/2022   High anion gap metabolic acidosis 01/02/2022   UTI (urinary tract infection) 11/03/2021   Hypoglycemia due to type 1 diabetes mellitus (HCC) 11/02/2021   Thrombocytopenia (HCC) 11/02/2021   Problem related to social environment 09/24/2021   Rhinovirus 09/22/2021   Acute on chronic HFrEF (heart failure with reduced ejection fraction) (HCC) 09/07/2021   Pneumonia due to human metapneumovirus 08/31/2021   Gastro-esophageal reflux disease with esophagitis 08/31/2021   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   Congestive heart failure (HCC) 08/31/2021   Hypoglycemia due to insulin 06/14/2021   DKA (diabetic ketoacidosis) (HCC) 06/14/2021   CKD stage 3 due to type 1 diabetes mellitus (HCC) 06/14/2021   Transaminitis 06/14/2021   Hypoglycemia associated with diabetes (HCC) 06/03/2021   DKA, type 1 (HCC) 05/20/2021   Major depressive disorder, recurrent episode, moderate (HCC) 05/08/2021   Right arm pain 05/07/2021   Rash of hand 05/07/2021   Hyperkalemia 05/06/2021   History of adrenal insufficiency 05/06/2021   CAP (community acquired pneumonia) 05/05/2021   Hypoglycemia 04/27/2021   Combined systolic and diastolic heart failure (HCC) 03/11/2021   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) 03/11/2021    Protein-calorie malnutrition, severe (HCC) 03/11/2021   Adrenal insufficiency (Addison's disease) (HCC) 03/10/2021   Postoperative hematoma involving genitourinary system following genitourinary procedure    S/P cesarean section 02/17/2021   Acute esophagitis    Leg edema    History of preterm delivery, currently pregnant    Pre-existing type 1 diabetes mellitus during pregnancy in third trimester    Anemia of chronic disease    Type 1 diabetes mellitus affecting pregnancy in second trimester, antepartum    HFrEF (heart failure with reduced ejection fraction) (HCC)    Acute deep vein thrombosis (DVT) of brachial vein of right upper extremity (HCC) 12/17/2020   Anxiety 12/17/2020   Diabetic retinopathy (HCC) 12/17/2020   Chronic combined systolic and diastolic CHF (congestive heart failure) (HCC)    Hyperemesis 12/13/2020   Type 1 diabetes mellitus with hypoglycemia without coma (HCC) 12/13/2020   Sunburn 12/13/2020   Hypothyroidism 12/10/2020   Malnutrition (HCC) 12/10/2020   Vitreous hemorrhage of right eye (HCC) 12/10/2020   Pleural effusion associated with pulmonary infection 11/25/2020   Pneumonia affecting pregnancy 11/24/2020   Diabetes mellitus affecting pregnancy, second trimester 11/24/2020   Edema 11/24/2020   Decreased urine output 11/24/2020   Shortness of breath    Zinc deficiency    SOB (shortness of breath)    Social problem 11/21/2020   Nausea and vomiting during pregnancy prior to [redacted] weeks gestation 11/21/2020   Low serum vitamin B12    Delivery with history of  C-section 11/20/2020   Absolute anemia    Acute kidney injury superimposed on chronic kidney disease (HCC)    Nausea and vomiting during pregnancy 10/23/2020   History of premature delivery, currently pregnant, second trimester 10/13/2020   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   Peripheral neuropathy 09/28/2018   Gastroparesis 04/22/2016   Diabetic sensorimotor neuropathy (HCC) 04/19/2016   DKA (diabetic  ketoacidoses) 04/03/2016   Acute bronchitis 06/01/2010   Microalbuminuria 09/26/2005     Past Medical History:  Diagnosis Date   Acute metabolic encephalopathy 06/10/2022   Adrenal insufficiency (HCC)    Anemia    CHF (congestive heart failure) (HCC)    Gastroparesis    Hypertension    Type 1 diabetes (HCC)      Past Surgical History:  Procedure Laterality Date   BIOPSY  01/14/2021   Procedure: BIOPSY;  Surgeon: Tressia Danas, MD;  Location: [facility] ENDOSCOPY;  Service: Gastroenterology;;   CESAREAN SECTION     CESAREAN SECTION WITH BILATERAL TUBAL LIGATION N/A 02/17/2021   Procedure: CESAREAN SECTION WITH BILATERAL TUBAL LIGATION;  Surgeon: [name], DO;  Location: [facility] LD ORS;  Service: Obstetrics;  Laterality: N/A;   ESOPHAGOGASTRODUODENOSCOPY (EGD) WITH PROPOFOL N/A 01/14/2021   Procedure: ESOPHAGOGASTRODUODENOSCOPY (EGD) WITH PROPOFOL;  Surgeon: Tressia Danas, MD;  Location: [facility] ENDOSCOPY;  Service: Gastroenterology;  Laterality: N/A;   JEJUNOSTOMY N/A 06/13/2022   Procedure: JEJUNOSTOMY;  Surgeon: Leafy Ro, MD;  Location: [facility] ORS;  Service: General;  Laterality: N/A;   MOUTH SURGERY      Social History   Socioeconomic History   Marital status: Single    Spouse name: Not on file   Number of children: Not on file   Years of education: Not on file   Highest education level: Not on file  Occupational History   Occupation: home maker  Tobacco Use   Smoking status: Never    Passive exposure: Never   Smokeless tobacco: Never  Vaping Use   Vaping Use: Never used  Substance and Sexual Activity   Alcohol use: No   Drug use: No   Sexual activity: Yes    Birth control/protection: None  Other Topics Concern   Not on file  Social History Narrative   Not on file   Social Determinants of Health   Financial Resource Strain: Not on file  Food Insecurity: No Food Insecurity (11/01/2022)   Hunger Vital Sign    Worried About Running Out of Food in the Last  Year: Never true    Ran Out of Food in the Last Year: Never true  Transportation Needs: No Transportation Needs (11/01/2022)   PRAPARE - Administrator, Civil Service (Medical): No    Lack of Transportation (Non-Medical): No  Physical Activity: Not on file  Stress: Not on file  Social Connections: Not on file  Intimate Partner Violence: Not At Risk (11/01/2022)   Humiliation, Afraid, Rape, and Kick questionnaire    Fear of Current or Ex-Partner: No    Emotionally Abused: No    Physically Abused: No    Sexually Abused: No     Family History  Problem Relation Age of Onset   Breast cancer Mother    Seizures Mother    Diabetes type I Father    CAD Father    Lung cancer Maternal Grandfather    CAD Paternal Grandmother    CAD Paternal Grandfather    Ovarian cancer Neg Hx      Current Outpatient Medications:  acetaminophen (TYLENOL) 500 MG tablet, Take 2 tablets (1,000 mg total) by mouth every 6 (six) hours as needed for headache, fever or moderate pain., Disp: 30 tablet, Rfl: 3   amLODipine (NORVASC) 10 MG tablet, Take 1 tablet (10 mg total) by mouth daily., Disp: 30 tablet, Rfl: 0   divalproex (DEPAKOTE ER) 250 MG 24 hr tablet, Take 1 tablet (250 mg total) by mouth daily., Disp: 30 tablet, Rfl: 1   FLUoxetine (PROZAC) 10 MG capsule, Take 1 capsule (10 mg total) by mouth daily., Disp: 30 capsule, Rfl: 0   hydrocortisone (CORTEF) 10 MG tablet, Take 2 tablets (20 mg total) by mouth daily with breakfast AND 1 tablet (10 mg total) daily with supper., Disp: 90 tablet, Rfl: 0   insulin aspart (NOVOLOG) 100 UNIT/ML injection, Inject 2 Units into the skin 3 (three) times daily with meals., Disp: 10 mL, Rfl: 0   insulin aspart (NOVOLOG) 100 UNIT/ML injection, Inject 0-6 Units into the skin 4 (four) times daily - after meals and at bedtime., Disp: 10 mL, Rfl: 0   Insulin Glargine (BASAGLAR KWIKPEN) 100 UNIT/ML, Inject 5 Units into the skin 2 (two) times daily., Disp: 15 mL, Rfl: 0    levothyroxine (SYNTHROID) 50 MCG tablet, Take 1 tablet (50 mcg total) by mouth daily at 6 (six) AM., Disp: 30 tablet, Rfl: 0   Multiple Vitamin (MULTIVITAMIN WITH MINERALS) TABS tablet, Take 1 tablet by mouth daily., Disp: , Rfl:    Nutritional Supplements (FEEDING SUPPLEMENT, GLUCERNA 1.5 CAL,) LIQD, Place 640 mLs into feeding tube at bedtime., Disp: 1000 mL, Rfl: 0   loperamide (IMODIUM) 2 MG capsule, Take 2 capsules (4 mg total) by mouth as needed for diarrhea or loose stools. (Patient not taking: Reported on 11/19/2022), Disp: 30 capsule, Rfl: 0   methocarbamol (ROBAXIN) 500 MG tablet, Take 1 tablet (500 mg total) by mouth 2 (two) times daily as needed for muscle spasms. (Patient not taking: Reported on 11/19/2022), Disp: 30 tablet, Rfl: 0   promethazine (PHENERGAN) 25 MG tablet, Take 0.5 tablets (12.5 mg total) by mouth every 6 (six) hours as needed for nausea or vomiting. (Patient not taking: Reported on 11/19/2022), Disp: 15 tablet, Rfl: 0   thiamine (VITAMIN B-1) 100 MG tablet, Take 1 tablet (100 mg total) by mouth daily. (Patient not taking: Reported on 11/19/2022), Disp: 30 tablet, Rfl: 0   Physical exam:  Vitals:   11/19/22 1104  BP: 134/81  Pulse: 93  Resp: 18  Temp: 98 F (36.7 C)  Weight: 107 lb 1.6 oz (48.6 kg)   Physical Exam Constitutional:      Appearance: She is not diaphoretic.     Comments: Mildly distressed due to nausea  HENT:     Head: Normocephalic and atraumatic.     Nose: Nose normal.     Mouth/Throat:     Pharynx: No oropharyngeal exudate.  Eyes:     General: No scleral icterus.    Pupils: Pupils are equal, round, and reactive to light.  Cardiovascular:     Rate and Rhythm: Normal rate and regular rhythm.     Heart sounds: No murmur heard. Pulmonary:     Effort: Pulmonary effort is normal. No respiratory distress.  Abdominal:     Palpations: Abdomen is soft.     Comments: J tube  Musculoskeletal:        General: Normal range of motion.     Cervical  back: Normal range of motion and neck supple.  Skin:  General: Skin is warm and dry.     Coloration: Skin is pale.     Findings: No erythema.  Neurological:     Mental Status: She is alert and oriented to person, place, and time. Mental status is at baseline.     Cranial Nerves: No cranial nerve deficit.     Motor: No abnormal muscle tone.     Coordination: Coordination normal.  Psychiatric:        Mood and Affect: Mood and affect normal.           Latest Ref Rng & Units 11/15/2022    5:44 AM  CMP  Glucose 70 - 99 mg/dL 161   BUN 6 - 20 mg/dL 61   Creatinine 0.96 - 1.00 mg/dL 0.45   Sodium 409 - 811 mmol/L 140   Potassium 3.5 - 5.1 mmol/L 5.4   Chloride 98 - 111 mmol/L 106   CO2 22 - 32 mmol/L 27   Calcium 8.9 - 10.3 mg/dL 8.4       Latest Ref Rng & Units 11/15/2022    5:44 AM  CBC  WBC 4.0 - 10.5 K/uL 6.5   Hemoglobin 12.0 - 15.0 g/dL 8.5   Hematocrit 91.4 - 46.0 % 26.5   Platelets 150 - 400 K/uL 190     RADIOGRAPHIC STUDIES: I have personally reviewed the radiological images as listed and agreed with the findings in the report. US Venous Img Lower Bilateral (DVT)  Result Date: 11/12/2022 CLINICAL DATA:  Bilateral lower extremity edema. EXAM: BILATERAL LOWER EXTREMITY VENOUS DOPPLER ULTRASOUND TECHNIQUE: Gray-scale sonography with graded compression, as well as color Doppler and duplex ultrasound were performed to evaluate the lower extremity deep venous systems from the level of the common femoral vein and including the common femoral, femoral, profunda femoral, popliteal and calf veins including the posterior tibial, peroneal and gastrocnemius veins when visible. The superficial great saphenous vein was also interrogated. Spectral Doppler was utilized to evaluate flow at rest and with distal augmentation maneuvers in the common femoral, femoral and popliteal veins. COMPARISON:  11/21/2020 FINDINGS: RIGHT LOWER EXTREMITY Common Femoral Vein: No evidence of thrombus.  Normal compressibility, respiratory phasicity and response to augmentation. Saphenofemoral Junction: No evidence of thrombus. Normal compressibility and flow on color Doppler imaging. Profunda Femoral Vein: No evidence of thrombus. Normal compressibility and flow on color Doppler imaging. Femoral Vein: No evidence of thrombus. Normal compressibility, respiratory phasicity and response to augmentation. Popliteal Vein: No evidence of thrombus. Normal compressibility, respiratory phasicity and response to augmentation. Calf Veins: No evidence of thrombus. Normal compressibility and flow on color Doppler imaging. Superficial Great Saphenous Vein: No evidence of thrombus. Normal compressibility. Venous Reflux:  None. Other Findings: No evidence of superficial thrombophlebitis or abnormal fluid collection. LEFT LOWER EXTREMITY Common Femoral Vein: No evidence of thrombus. Normal compressibility, respiratory phasicity and response to augmentation. Saphenofemoral Junction: No evidence of thrombus. Normal compressibility and flow on color Doppler imaging. Profunda Femoral Vein: No evidence of thrombus. Normal compressibility and flow on color Doppler imaging. Femoral Vein: No evidence of thrombus. Normal compressibility, respiratory phasicity and response to augmentation. Popliteal Vein: No evidence of thrombus. Normal compressibility, respiratory phasicity and response to augmentation. Calf Veins: No evidence of thrombus. Normal compressibility and flow on color Doppler imaging. Superficial Great Saphenous Vein: No evidence of thrombus. Normal compressibility. Venous Reflux:  None. Other Findings: No evidence of superficial thrombophlebitis or abnormal fluid collection. IMPRESSION: No evidence of deep venous thrombosis in either lower extremity. Electronically Signed  By: Irish Lack M.D.   On: 11/12/2022 15:14   DG ABDOMEN PEG TUBE LOCATION  Result Date: 11/02/2022 CLINICAL DATA:  [id number] Malnutrition (HCC) [id number]  EXAM: ABDOMEN - 1 VIEW COMPARISON:  11/01/2022 CT FINDINGS: 30 mL of Gastrografin was injected into patient's existing percutaneous jejunostomy tube. Contrast is seen filling nondilated loops of proximal jejunum. No extraluminal contrast collections. Bowel gas pattern is nonobstructive. There is excreted contrast within the urinary bladder. No gross free intraperitoneal air on supine view. IMPRESSION: Percutaneous jejunostomy tube appears appropriately positioned. Electronically Signed   By: Duanne Guess D.O.   On: 11/02/2022 11:02   CT ABDOMEN PELVIS W CONTRAST  Result Date: 11/01/2022 CLINICAL DATA:  Left-sided abdominal pain. Nausea and vomiting. Dehydration. Jejunostomy tube. 10 lb weight loss in past month. EXAM: CT ABDOMEN AND PELVIS WITH CONTRAST TECHNIQUE: Multidetector CT imaging of the abdomen and pelvis was performed using the standard protocol following bolus administration of intravenous contrast. RADIATION DOSE REDUCTION: This exam was performed according to the departmental dose-optimization program which includes automated exposure control, adjustment of the mA and/or kV according to patient size and/or use of iterative reconstruction technique. CONTRAST:  75mL OMNIPAQUE IOHEXOL 300 MG/ML  SOLN COMPARISON:  10/01/2022 FINDINGS: Lower Chest: No acute findings. Hepatobiliary: No hepatic masses identified. Gallbladder is unremarkable. No evidence of biliary ductal dilatation. Pancreas:  No mass or inflammatory changes. Spleen: Within normal limits in size and appearance. Adrenals/Urinary Tract: No suspicious masses identified. No evidence of ureteral calculi or hydronephrosis. Stable mild diffuse bladder wall thickening, suspicious for cystitis. Stomach/Bowel: Jejunostomy tube in appropriate position. Evidence of free intraperitoneal air. No evidence of bowel obstruction, inflammatory process or abscess. Vascular/Lymphatic: No pathologically enlarged lymph nodes. No acute vascular findings.  Reproductive: Uterus is unremarkable. No adnexal masses identified. Small amount of free fluid in the dependent pelvis shows no significant change since previous study, and is most likely physiologic. Other:  None. Musculoskeletal:  No suspicious bone lesions identified. IMPRESSION: Jejunostomy tube in appropriate position. No evidence of bowel obstruction or other acute findings. Stable mild diffuse bladder wall thickening, suspicious for cystitis. Suggest correlation with urinalysis. Electronically Signed   By: Danae Orleans M.D.   On: 11/01/2022 19:01    Assessment and plan-   #Acute on chronic anemia during second trimester pregnancy. Pathology smear showed no schistocytes, blasts. Reticulocyte panel showed inappropriately normal reticulocyte percentage.-Indicating marrow underproduction. Status post 2 unit of PRBC transfusion.  Hemoglobin increased to 8.5. # Iron deficiency iron panel was not helpful with a ferritin of 626-likely secondary to acute inflammation/infection.  10/30/2020 ferritin was 11.8 at [facility].  Reticulocyte panel showed normal reticulocyte hemoglobin.  MCV is normal.-Not typical for severe iron deficiency. Patient was given empiric IV Venofer 200 mg x 1. LDH is normal.  Normal bilirubin, less likely hemolysis. Haptoglobin pending.  Hemoglobinopathy evaluation pending, SPEP pending, parvovirus pending. Discussed with patient that if all work-up is negative and hemoglobin is now stable, would consider bone marrow biopsy.  She agrees.  # Borderline low B12 level, started on vitamin B12 intramuscular injection 1000 MCG daily.  Ordered 5 doses. #Zinc deficiency, started on oral zinc supplementation.  Zinc level to be decreased due to hypoalbuminemia.  #Hypoxia, felt to be secondary to multifocal pneumonia, Check BMP #Bilateral pleural effusion, pulmonary edema.  Differentials include volume overload, CHF, TACO, TRALI [onset of hypoxia was >6 hours after 6/30 blood transfusion, less  likely].  I recommend to check an echocardiogram.  #AKI, creatinine increased to 1.54.  Possible  due to recent contrast exposure versus other etiologies.  Thank you for allowing me to participate in the care of this patient.  Discussed with Dr. [name].  Rickard Patience, MD, PhD Hematology Oncology [facility] Pager- [phone] 11/20/2022

## 2022-11-20 NOTE — Assessment & Plan Note (Signed)
Continue tube feeding and follow-up with nutritionist.

## 2022-11-22 ENCOUNTER — Observation Stay: Payer: Medicaid Other | Admitting: Radiology

## 2022-11-22 ENCOUNTER — Other Ambulatory Visit: Payer: Self-pay

## 2022-11-22 ENCOUNTER — Inpatient Hospital Stay
Admission: EM | Admit: 2022-11-22 | Discharge: 2022-11-28 | DRG: 637 | Disposition: A | Discharge status: Home / Self Care | Payer: Medicaid Other | Attending: Internal Medicine | Admitting: Internal Medicine

## 2022-11-22 DIAGNOSIS — N179 Acute kidney failure, unspecified: Secondary | ICD-10-CM | POA: Diagnosis present

## 2022-11-22 DIAGNOSIS — Z8249 Family history of ischemic heart disease and other diseases of the circulatory system: Secondary | ICD-10-CM

## 2022-11-22 DIAGNOSIS — N1832 Chronic kidney disease, stage 3b: Secondary | ICD-10-CM | POA: Diagnosis not present

## 2022-11-22 DIAGNOSIS — I10 Essential (primary) hypertension: Secondary | ICD-10-CM | POA: Diagnosis present

## 2022-11-22 DIAGNOSIS — K3184 Gastroparesis: Secondary | ICD-10-CM | POA: Diagnosis not present

## 2022-11-22 DIAGNOSIS — Z931 Gastrostomy status: Secondary | ICD-10-CM | POA: Diagnosis not present

## 2022-11-22 DIAGNOSIS — K9423 Gastrostomy malfunction: Secondary | ICD-10-CM | POA: Diagnosis not present

## 2022-11-22 DIAGNOSIS — K9419 Other complications of enterostomy: Secondary | ICD-10-CM | POA: Diagnosis not present

## 2022-11-22 DIAGNOSIS — E039 Hypothyroidism, unspecified: Secondary | ICD-10-CM | POA: Diagnosis present

## 2022-11-22 DIAGNOSIS — I5032 Chronic diastolic (congestive) heart failure: Secondary | ICD-10-CM | POA: Diagnosis present

## 2022-11-22 DIAGNOSIS — E440 Moderate protein-calorie malnutrition: Secondary | ICD-10-CM | POA: Diagnosis not present

## 2022-11-22 DIAGNOSIS — Z419 Encounter for procedure for purposes other than remedying health state, unspecified: Secondary | ICD-10-CM | POA: Diagnosis not present

## 2022-11-22 DIAGNOSIS — Z434 Encounter for attention to other artificial openings of digestive tract: Secondary | ICD-10-CM

## 2022-11-22 DIAGNOSIS — R627 Adult failure to thrive: Secondary | ICD-10-CM | POA: Diagnosis present

## 2022-11-22 DIAGNOSIS — Z833 Family history of diabetes mellitus: Secondary | ICD-10-CM

## 2022-11-22 DIAGNOSIS — E162 Hypoglycemia, unspecified: Secondary | ICD-10-CM | POA: Diagnosis present

## 2022-11-22 DIAGNOSIS — Z681 Body mass index (BMI) 19 or less, adult: Secondary | ICD-10-CM

## 2022-11-22 DIAGNOSIS — Z801 Family history of malignant neoplasm of trachea, bronchus and lung: Secondary | ICD-10-CM

## 2022-11-22 DIAGNOSIS — T85528A Displacement of other gastrointestinal prosthetic devices, implants and grafts, initial encounter: Secondary | ICD-10-CM

## 2022-11-22 DIAGNOSIS — Z9851 Tubal ligation status: Secondary | ICD-10-CM

## 2022-11-22 DIAGNOSIS — E271 Primary adrenocortical insufficiency: Secondary | ICD-10-CM | POA: Diagnosis present

## 2022-11-22 DIAGNOSIS — Z803 Family history of malignant neoplasm of breast: Secondary | ICD-10-CM

## 2022-11-22 DIAGNOSIS — E43 Unspecified severe protein-calorie malnutrition: Secondary | ICD-10-CM | POA: Diagnosis present

## 2022-11-22 DIAGNOSIS — I110 Hypertensive heart disease with heart failure: Secondary | ICD-10-CM | POA: Diagnosis present

## 2022-11-22 DIAGNOSIS — E1065 Type 1 diabetes mellitus with hyperglycemia: Secondary | ICD-10-CM | POA: Diagnosis present

## 2022-11-22 DIAGNOSIS — Z794 Long term (current) use of insulin: Secondary | ICD-10-CM

## 2022-11-22 DIAGNOSIS — E109 Type 1 diabetes mellitus without complications: Secondary | ICD-10-CM | POA: Diagnosis not present

## 2022-11-22 DIAGNOSIS — Z79890 Hormone replacement therapy (postmenopausal): Secondary | ICD-10-CM

## 2022-11-22 DIAGNOSIS — E1043 Type 1 diabetes mellitus with diabetic autonomic (poly)neuropathy: Secondary | ICD-10-CM | POA: Diagnosis present

## 2022-11-22 DIAGNOSIS — E10649 Type 1 diabetes mellitus with hypoglycemia without coma: Principal | ICD-10-CM | POA: Diagnosis present

## 2022-11-22 DIAGNOSIS — Z79899 Other long term (current) drug therapy: Secondary | ICD-10-CM

## 2022-11-22 HISTORY — PX: IR REPLC DUODEN/JEJUNO TUBE PERCUT W/FLUORO: IMG2334

## 2022-11-22 LAB — BASIC METABOLIC PANEL
Anion gap: 9 (ref 5–15)
BUN: 30 mg/dL — ABNORMAL HIGH (ref 6–20)
CO2: 23 mmol/L (ref 22–32)
Calcium: 8.2 mg/dL — ABNORMAL LOW (ref 8.9–10.3)
Chloride: 103 mmol/L (ref 98–111)
Creatinine, Ser: 1.93 mg/dL — ABNORMAL HIGH (ref 0.44–1.00)
GFR, Estimated: 35 mL/min — ABNORMAL LOW (ref >60)
Glucose, Bld: 116 mg/dL — ABNORMAL HIGH (ref 70–99)
Potassium: 4.8 mmol/L (ref 3.5–5.1)
Sodium: 135 mmol/L (ref 135–145)

## 2022-11-22 LAB — GLUCOSE, CAPILLARY
Glucose-Capillary: 144 mg/dL — ABNORMAL HIGH (ref 70–99)
Glucose-Capillary: 201 mg/dL — ABNORMAL HIGH (ref 70–99)
Glucose-Capillary: 222 mg/dL — ABNORMAL HIGH (ref 70–99)
Glucose-Capillary: 359 mg/dL — ABNORMAL HIGH (ref 70–99)
Glucose-Capillary: 380 mg/dL — ABNORMAL HIGH (ref 70–99)
Glucose-Capillary: 51 mg/dL — ABNORMAL LOW (ref 70–99)
Glucose-Capillary: 58 mg/dL — ABNORMAL LOW (ref 70–99)
Glucose-Capillary: 61 mg/dL — ABNORMAL LOW (ref 70–99)

## 2022-11-22 LAB — CBC WITH DIFFERENTIAL/PLATELET
Abs Immature Granulocytes: 0.02 10*3/uL (ref 0.00–0.07)
Basophils Absolute: 0.1 10*3/uL (ref 0.0–0.1)
Basophils Relative: 1 %
Eosinophils Absolute: 0.3 10*3/uL (ref 0.0–0.5)
Eosinophils Relative: 5 %
HCT: 28.7 % — ABNORMAL LOW (ref 36.0–46.0)
Hemoglobin: 9.3 g/dL — ABNORMAL LOW (ref 12.0–15.0)
Immature Granulocytes: 0 %
Lymphocytes Relative: 24 %
Lymphs Abs: 1.5 10*3/uL (ref 0.7–4.0)
MCH: 30 pg (ref 26.0–34.0)
MCHC: 32.4 g/dL (ref 30.0–36.0)
MCV: 92.6 fL (ref 80.0–100.0)
Monocytes Absolute: 0.6 10*3/uL (ref 0.1–1.0)
Monocytes Relative: 10 %
Neutro Abs: 4 10*3/uL (ref 1.7–7.7)
Neutrophils Relative %: 60 %
Platelets: 156 10*3/uL (ref 150–400)
RBC: 3.1 MIL/uL — ABNORMAL LOW (ref 3.87–5.11)
RDW: 14.3 % (ref 11.5–15.5)
WBC: 6.5 10*3/uL (ref 4.0–10.5)
nRBC: 0 % (ref 0.0–0.2)

## 2022-11-22 LAB — COMPREHENSIVE METABOLIC PANEL
ALT: 17 U/L (ref 0–44)
AST: 22 U/L (ref 15–41)
Albumin: 3.7 g/dL (ref 3.5–5.0)
Alkaline Phosphatase: 64 U/L (ref 38–126)
Anion gap: 10 (ref 5–15)
BUN: 30 mg/dL — ABNORMAL HIGH (ref 6–20)
CO2: 25 mmol/L (ref 22–32)
Calcium: 8.6 mg/dL — ABNORMAL LOW (ref 8.9–10.3)
Chloride: 103 mmol/L (ref 98–111)
Creatinine, Ser: 2.14 mg/dL — ABNORMAL HIGH (ref 0.44–1.00)
GFR, Estimated: 31 mL/min — ABNORMAL LOW (ref >60)
Glucose, Bld: 58 mg/dL — ABNORMAL LOW (ref 70–99)
Potassium: 4.9 mmol/L (ref 3.5–5.1)
Sodium: 138 mmol/L (ref 135–145)
Total Bilirubin: 0.5 mg/dL (ref 0.3–1.2)
Total Protein: 6.4 g/dL — ABNORMAL LOW (ref 6.5–8.1)

## 2022-11-22 LAB — CBG MONITORING, ED
Glucose-Capillary: 190 mg/dL — ABNORMAL HIGH (ref 70–99)
Glucose-Capillary: 41 mg/dL — CL (ref 70–99)

## 2022-11-22 LAB — CBC
HCT: 26.4 % — ABNORMAL LOW (ref 36.0–46.0)
Hemoglobin: 8.5 g/dL — ABNORMAL LOW (ref 12.0–15.0)
MCH: 29.7 pg (ref 26.0–34.0)
MCHC: 32.2 g/dL (ref 30.0–36.0)
MCV: 92.3 fL (ref 80.0–100.0)
Platelets: 136 10*3/uL — ABNORMAL LOW (ref 150–400)
RBC: 2.86 MIL/uL — ABNORMAL LOW (ref 3.87–5.11)
RDW: 14.2 % (ref 11.5–15.5)
WBC: 5.5 10*3/uL (ref 4.0–10.5)
nRBC: 0 % (ref 0.0–0.2)

## 2022-11-22 LAB — MAGNESIUM: Magnesium: 1.9 mg/dL (ref 1.7–2.4)

## 2022-11-22 LAB — HIV ANTIBODY (ROUTINE TESTING W REFLEX): HIV Screen 4th Generation wRfx: NONREACTIVE

## 2022-11-22 LAB — LIPASE, BLOOD: Lipase: 26 U/L (ref 11–51)

## 2022-11-22 MED ORDER — ONDANSETRON HCL 4 MG PO TABS: 4.0000 mg | ORAL_TABLET | Freq: Four times a day (QID) | ORAL | Status: DC | PRN

## 2022-11-22 MED ORDER — ADULT MULTIVITAMIN W/MINERALS CH
1.0000 | ORAL_TABLET | Freq: Every day | ORAL | Status: DC
  Administered 2022-11-22 – 2022-11-23 (×2): 1 via ORAL
  Filled 2022-11-22 (×2): qty 1

## 2022-11-22 MED ORDER — DEXTROSE 50 % IV SOLN: 1.0000 | Freq: Once | INTRAVENOUS | Status: AC

## 2022-11-22 MED ORDER — ENOXAPARIN SODIUM 30 MG/0.3ML IJ SOSY
30.0000 mg | PREFILLED_SYRINGE | INTRAMUSCULAR | Status: DC
  Administered 2022-11-22: 30 mg via SUBCUTANEOUS
  Filled 2022-11-22: qty 0.3

## 2022-11-22 MED ORDER — OXYCODONE HCL 5 MG PO TABS
5.0000 mg | ORAL_TABLET | ORAL | Status: DC | PRN
  Administered 2022-11-22: 5 mg via ORAL
  Filled 2022-11-22: qty 1

## 2022-11-22 MED ORDER — LIDOCAINE VISCOUS HCL 2 % MT SOLN
15.0000 mL | Freq: Once | OROMUCOSAL | Status: AC
  Administered 2022-11-22: 15 mL via OROMUCOSAL
  Filled 2022-11-22: qty 15

## 2022-11-22 MED ORDER — LACTATED RINGERS IV BOLUS
1000.0000 mL | Freq: Once | INTRAVENOUS | Status: AC
  Administered 2022-11-22: 1000 mL via INTRAVENOUS

## 2022-11-22 MED ORDER — INSULIN ASPART 100 UNIT/ML IJ SOLN
2.0000 [IU] | Freq: Three times a day (TID) | INTRAMUSCULAR | Status: DC
  Administered 2022-11-22 – 2022-11-26 (×7): 2 [IU] via SUBCUTANEOUS
  Filled 2022-11-22 (×9): qty 1

## 2022-11-22 MED ORDER — STERILE WATER FOR INJECTION IJ SOLN
INTRAMUSCULAR | Status: AC
  Filled 2022-11-22: qty 10

## 2022-11-22 MED ORDER — LIDOCAINE HCL 1 % IJ SOLN
10.0000 mL | Freq: Once | INTRAMUSCULAR | Status: AC
  Administered 2022-11-22: 10 mL via INTRADERMAL
  Filled 2022-11-22: qty 10

## 2022-11-22 MED ORDER — LIDOCAINE HCL 1 % IJ SOLN
INTRAMUSCULAR | Status: AC
  Filled 2022-11-22: qty 20

## 2022-11-22 MED ORDER — OSMOLITE 1.2 CAL PO LIQD: 1000.0000 mL | ORAL | Status: DC

## 2022-11-22 MED ORDER — FREE WATER: 100.0000 mL | Status: DC

## 2022-11-22 MED ORDER — INSULIN GLARGINE-YFGN 100 UNIT/ML ~~LOC~~ SOLN
5.0000 [IU] | Freq: Two times a day (BID) | SUBCUTANEOUS | Status: DC
  Administered 2022-11-22: 5 [IU] via SUBCUTANEOUS
  Filled 2022-11-22 (×2): qty 0.05

## 2022-11-22 MED ORDER — SODIUM CHLORIDE 0.9 % IV SOLN: INTRAVENOUS | Status: DC

## 2022-11-22 MED ORDER — INSULIN ASPART 100 UNIT/ML IJ SOLN
0.0000 [IU] | Freq: Three times a day (TID) | INTRAMUSCULAR | Status: DC
  Administered 2022-11-22: 2 [IU] via SUBCUTANEOUS
  Filled 2022-11-22: qty 1

## 2022-11-22 MED ORDER — ZINC SULFATE 220 (50 ZN) MG PO CAPS
220.0000 mg | ORAL_CAPSULE | Freq: Every day | ORAL | Status: DC
  Administered 2022-11-22 – 2022-11-28 (×7): 220 mg via ORAL
  Filled 2022-11-22 (×7): qty 1

## 2022-11-22 MED ORDER — FLUOXETINE HCL 10 MG PO CAPS
10.0000 mg | ORAL_CAPSULE | Freq: Every day | ORAL | Status: DC
  Administered 2022-11-22 – 2022-11-28 (×7): 10 mg via ORAL
  Filled 2022-11-22 (×7): qty 1

## 2022-11-22 MED ORDER — ONDANSETRON HCL 4 MG/2ML IJ SOLN
4.0000 mg | Freq: Four times a day (QID) | INTRAMUSCULAR | Status: DC | PRN
  Administered 2022-11-22 – 2022-11-24 (×4): 4 mg via INTRAVENOUS
  Filled 2022-11-22 (×4): qty 2

## 2022-11-22 MED ORDER — DEXTROSE 50 % IV SOLN
INTRAVENOUS | Status: AC
  Administered 2022-11-22: 50 mL via INTRAVENOUS
  Filled 2022-11-22: qty 50

## 2022-11-22 MED ORDER — DEXTROSE 50 % IV SOLN
12.5000 g | Freq: Once | INTRAVENOUS | Status: AC
  Administered 2022-11-22: 12.5 g via INTRAVENOUS

## 2022-11-22 MED ORDER — FREE WATER
100.0000 mL | Status: DC
  Administered 2022-11-22: 100 mL

## 2022-11-22 MED ORDER — LEVOTHYROXINE SODIUM 50 MCG PO TABS
50.0000 ug | ORAL_TABLET | Freq: Every day | ORAL | Status: DC
  Administered 2022-11-23 – 2022-11-28 (×6): 50 ug via ORAL
  Filled 2022-11-22 (×6): qty 1

## 2022-11-22 MED ORDER — DEXTROSE 50 % IV SOLN
INTRAVENOUS | Status: AC
  Filled 2022-11-22: qty 50

## 2022-11-22 MED ORDER — ACETAMINOPHEN 650 MG RE SUPP: 650.0000 mg | Freq: Four times a day (QID) | RECTAL | Status: DC | PRN

## 2022-11-22 MED ORDER — DEXTROSE 50 % IV SOLN
1.0000 | INTRAVENOUS | Status: DC | PRN
  Administered 2022-11-23 (×2): 50 mL via INTRAVENOUS
  Administered 2022-11-25: 15 mL via INTRAVENOUS
  Administered 2022-11-26 – 2022-11-27 (×2): 50 mL via INTRAVENOUS
  Filled 2022-11-22 (×4): qty 50

## 2022-11-22 MED ORDER — DEXTROSE 50 % IV SOLN
1.0000 | Freq: Once | INTRAVENOUS | Status: AC
  Administered 2022-11-22: 50 mL via INTRAVENOUS
  Filled 2022-11-22: qty 50

## 2022-11-22 MED ORDER — INSULIN ASPART 100 UNIT/ML IJ SOLN: 0.0000 [IU] | Freq: Three times a day (TID) | INTRAMUSCULAR | Status: DC

## 2022-11-22 MED ORDER — TRAZODONE HCL 50 MG PO TABS: 25.0000 mg | ORAL_TABLET | Freq: Every evening | ORAL | Status: DC | PRN

## 2022-11-22 MED ORDER — DIVALPROEX SODIUM ER 250 MG PO TB24
250.0000 mg | ORAL_TABLET | Freq: Every day | ORAL | Status: DC
  Administered 2022-11-22 – 2022-11-28 (×7): 250 mg via ORAL
  Filled 2022-11-22 (×7): qty 1

## 2022-11-22 MED ORDER — MORPHINE SULFATE (PF) 2 MG/ML IV SOLN
2.0000 mg | Freq: Once | INTRAVENOUS | Status: AC
  Administered 2022-11-22: 2 mg via INTRAVENOUS
  Filled 2022-11-22: qty 1

## 2022-11-22 MED ORDER — ACETAMINOPHEN 325 MG PO TABS: 650.0000 mg | ORAL_TABLET | Freq: Four times a day (QID) | ORAL | Status: DC | PRN

## 2022-11-22 MED ORDER — AMLODIPINE BESYLATE 10 MG PO TABS
10.0000 mg | ORAL_TABLET | Freq: Every day | ORAL | Status: DC
  Administered 2022-11-22 – 2022-11-28 (×6): 10 mg via ORAL
  Filled 2022-11-22 (×6): qty 1

## 2022-11-22 MED ORDER — INSULIN GLARGINE-YFGN 100 UNIT/ML ~~LOC~~ SOLN
3.0000 [IU] | Freq: Two times a day (BID) | SUBCUTANEOUS | Status: DC
  Administered 2022-11-22 – 2022-11-26 (×8): 3 [IU] via SUBCUTANEOUS
  Filled 2022-11-22 (×10): qty 0.03

## 2022-11-22 MED ORDER — MORPHINE SULFATE (PF) 2 MG/ML IV SOLN
2.0000 mg | INTRAVENOUS | Status: DC | PRN
  Administered 2022-11-22 (×2): 2 mg via INTRAVENOUS
  Filled 2022-11-22 (×2): qty 1

## 2022-11-22 MED ORDER — IOHEXOL 300 MG/ML  SOLN
8.0000 mL | Freq: Once | INTRAMUSCULAR | Status: AC | PRN
  Administered 2022-11-22: 8 mL

## 2022-11-22 MED ORDER — HYDROMORPHONE HCL 1 MG/ML IJ SOLN
0.5000 mg | Freq: Once | INTRAMUSCULAR | Status: AC
  Administered 2022-11-22: 0.5 mg via INTRAVENOUS
  Filled 2022-11-22: qty 0.5

## 2022-11-22 MED ORDER — GLUCERNA 1.5 CAL PO LIQD
640.0000 mL | ORAL | Status: DC
  Administered 2022-11-22: 640 mL

## 2022-11-22 MED ORDER — LIDOCAINE VISCOUS HCL 2 % MT SOLN
OROMUCOSAL | Status: AC
  Filled 2022-11-22: qty 15

## 2022-11-22 MED ORDER — INSULIN ASPART 100 UNIT/ML IJ SOLN: 0.0000 [IU] | INTRAMUSCULAR | Status: DC

## 2022-11-22 MED ORDER — MAGNESIUM HYDROXIDE 400 MG/5ML PO SUSP: 30.0000 mL | Freq: Every day | ORAL | Status: DC | PRN

## 2022-11-22 MED ORDER — HYDROCORTISONE 10 MG PO TABS
10.0000 mg | ORAL_TABLET | Freq: Every day | ORAL | Status: DC
  Administered 2022-11-22 – 2022-11-28 (×7): 10 mg via ORAL
  Filled 2022-11-22 (×7): qty 1

## 2022-11-22 NOTE — Assessment & Plan Note (Signed)
Sliding scale Novolog, very sensitive 2 units Novolog TID WC Semglee 3 units BID Appreciate diabetes coordinator input

## 2022-11-22 NOTE — Progress Notes (Addendum)
Brief rounding note, same day as admission  HPI: Pt admitted after midnight with AKI and her J tube had come out.  See H&P for full HPI on admission.  Interval history: Pt awake sitting up in bed when seen this AM, after J tube was replaced by IR.  She's reporting pain due to the procedure, minimal relief despite 2mg  morphine given.  She reports some left sided abdominal pain that's been present since before the tube was placed back in January, still present and persistent.  She reports not having too much trouble with gastroparesis recently, until they ate pizza for dinner recently.  She is not sure how her tube came out, does not recall it being pulled on or caught on anything.  The new tube is slightly different than her old one, wants to use it here for night feeds and be sure she's comfortable using it at home.   She continues to do nocturnal tube feeds at home.   Exam: General exam: awake, alert, no acute distress HEENT: moist mucus membranes, hearing grossly normal  Respiratory system: on room air, normal respiratory effort. Cardiovascular system: normal S1/S2, RRR Gastrointestinal system: soft, NT, ND, new J tube present 4 Central nervous system: A&O x4. no gross focal neurologic deficits, normal speech Extremities: moves all, no edema, normal tone Skin: dry, intact, normal temperature Psychiatry: normal mood, congruent affect, judgement and insight appear normal    A&P: as per H&P by Dr. Arville Care, with any changes or additions as below:  --Pt w recurrent hypoglycemia this AM, amp D50 ordered as pt is NPO --Hypoglycemia protocol for NPO pt ordered, with standing D50 PRN --Insulin orders updated per diabetes coordinator recommendations, appreciate input --Continue IV fluids for AKI  --New J tube in place & ready for use --RD consulted for nocturnal tube feeds  --Oncology telephone encounter noted in patient's chart about a biopsy to be done tomorrow while inpatient -  noted.    No charge

## 2022-11-22 NOTE — Assessment & Plan Note (Signed)
Cr on admission 2.14, up from prior 1.25.   Improving with hydration. --Continue IV fluids --Monitor BMP

## 2022-11-22 NOTE — Assessment & Plan Note (Signed)
IR was able to replace the tube 7/1. RD consulted for nocturnal tube feeds. Pt does nocturnal feeds at home, recently at 60 cc/hr bc she's been tolerating PO meals recently. Developed N/V with tube feeds at 80 /hr last night. --will lower rate of feeds to 60 cc/hr

## 2022-11-22 NOTE — ED Triage Notes (Signed)
Pt to ed from home via POV for her G tube that fell out. Pt is unsure when it fell out. Pt is caox4, in no acute distress and ambulatory in triage.

## 2022-11-22 NOTE — Procedures (Signed)
Interventional Radiology Procedure Note  Date of Procedure: 11/22/2022  Procedure: J tube replacement   Findings:  1. Successful replacement of J tube with new 16 Fr baloon retention J tube through existing stoma.    Complications: No immediate complications noted.   Estimated Blood Loss: minimal  Follow-up and Recommendations: 1. Ready for use.    Olive Bass, MD  Vascular & Interventional Radiology  11/22/2022 9:55 AM

## 2022-11-22 NOTE — H&P (Signed)
[place]   PATIENT NAME: Ashley Camacho    MR#:  [id number]  DATE OF BIRTH:  12/11/1990  DATE OF ADMISSION:  11/22/2022  PRIMARY CARE PHYSICIAN: Pcp, No   Patient is coming from: Home  REQUESTING/REFERRING PHYSICIAN:, Dylan, MD  CHIEF COMPLAINT:   J-tube fell off.  HISTORY OF PRESENT ILLNESS:  Ashley Camacho is a 32 y.o. female with medical history significant for type 1 diabetes mellitus with gastroparesis s/p G-tube placement, CHF, hypertension, and adrenal insufficiency, who presented to the emergency room with acute onset of falling off J-tube.  She denies any nausea or vomiting however has been having left upper quadrant abdominal pain.  No fever or chills.  No chest pain or palpitations.  No dysuria, oliguria or hematuria or flank pain.  ED Course: When the patient came to the ER, temperature was 100 and later 98.1, BP 94/63 and later 140/77 with heart rate of 109 and later 89.  Vital signs otherwise were within normal.  Labs revealed hyperglycemia of 58, BUN of 30 and creatinine of 2.14 compared to 61/1.25 on 11/11/2022, Some 8.6 and total protein 6.4 with CBC showing hemoglobin 9.3 and hematocrit 28.7 above previous levels. EKG as reviewed by me : EKG showed sinus rhythm with a rate of 93 with borderline low voltage QRS and Q waves anteroseptally. Imaging: None.  The patient was given an amp of D50 and 1 L bolus of IV lactated Ringer.  Dr. Everlene Farrier was notified and is aware about the patient.  Dr. Katrinka Blazing attempt replacing jejunostomy tube with G-tube in the ER and it was not successful.  She will be admitted to an observation medical bed for further evaluation and management. PAST MEDICAL HISTORY:   Past Medical History:  Diagnosis Date   Acute metabolic encephalopathy 06/10/2022   Adrenal insufficiency (HCC)    Anemia    CHF (congestive heart failure) (HCC)    Gastroparesis    Hypertension    Type 1 diabetes (HCC)     PAST SURGICAL HISTORY:   Past Surgical  History:  Procedure Laterality Date   BIOPSY  01/14/2021   Procedure: BIOPSY;  Surgeon: Tressia Danas, MD;  Location: [facility] ENDOSCOPY;  Service: Gastroenterology;;   CESAREAN SECTION     CESAREAN SECTION WITH BILATERAL TUBAL LIGATION N/A 02/17/2021   Procedure: CESAREAN SECTION WITH BILATERAL TUBAL LIGATION;  Surgeon: [name], DO;  Location: [facility] LD ORS;  Service: Obstetrics;  Laterality: N/A;   ESOPHAGOGASTRODUODENOSCOPY (EGD) WITH PROPOFOL N/A 01/14/2021   Procedure: ESOPHAGOGASTRODUODENOSCOPY (EGD) WITH PROPOFOL;  Surgeon: Tressia Danas, MD;  Location: [facility] ENDOSCOPY;  Service: Gastroenterology;  Laterality: N/A;   JEJUNOSTOMY N/A 06/13/2022   Procedure: JEJUNOSTOMY;  Surgeon: Leafy Ro, MD;  Location: [facility] ORS;  Service: General;  Laterality: N/A;   MOUTH SURGERY      SOCIAL HISTORY:   Social History   Tobacco Use   Smoking status: Never    Passive exposure: Never   Smokeless tobacco: Never  Substance Use Topics   Alcohol use: No    FAMILY HISTORY:   Family History  Problem Relation Age of Onset   Breast cancer Mother    Seizures Mother    Diabetes type I Father    CAD Father    Lung cancer Maternal Grandfather    CAD Paternal Grandmother    CAD Paternal Grandfather    Ovarian cancer Neg Hx     DRUG ALLERGIES:  No Known Allergies  REVIEW OF SYSTEMS:  ROS As per history of present illness. All pertinent systems were reviewed above. Constitutional, HEENT, cardiovascular, respiratory, GI, GU, musculoskeletal, neuro, psychiatric, endocrine, integumentary and hematologic systems were reviewed and are otherwise negative/unremarkable except for positive findings mentioned above in the HPI.   MEDICATIONS AT HOME:   Prior to Admission medications   Medication Sig Start Date End Date Taking? Authorizing Provider  acetaminophen (TYLENOL) 500 MG tablet Take 2 tablets (1,000 mg total) by mouth every 6 (six) hours as needed for headache, fever or moderate pain.  08/05/22  Yes Loyce Dys, MD  amLODipine (NORVASC) 10 MG tablet Take 1 tablet (10 mg total) by mouth daily. 11/16/22 12/16/22 Yes Leeroy Bock, MD  divalproex (DEPAKOTE ER) 250 MG 24 hr tablet Take 1 tablet (250 mg total) by mouth daily. 08/05/22  Yes Loyce Dys, MD  FLUoxetine (PROZAC) 10 MG capsule Take 1 capsule (10 mg total) by mouth daily. 08/05/22 11/22/22 Yes Djan, Scarlette Calico, MD  hydrocortisone (CORTEF) 10 MG tablet Take 2 tablets (20 mg total) by mouth daily with breakfast AND 1 tablet (10 mg total) daily with supper. 08/05/22  Yes Loyce Dys, MD  insulin aspart (NOVOLOG) 100 UNIT/ML injection Inject 0-6 Units into the skin 4 (four) times daily - after meals and at bedtime. 11/15/22  Yes Leeroy Bock, MD  Insulin Glargine (BASAGLAR KWIKPEN) 100 UNIT/ML Inject 5 Units into the skin 2 (two) times daily. 11/15/22 05/22/23 Yes Leeroy Bock, MD  loperamide (IMODIUM) 2 MG capsule Take 2 capsules (4 mg total) by mouth as needed for diarrhea or loose stools. 11/15/22  Yes Leeroy Bock, MD  methocarbamol (ROBAXIN) 500 MG tablet Take 1 tablet (500 mg total) by mouth 2 (two) times daily as needed for muscle spasms. 11/15/22 12/15/22 Yes Leeroy Bock, MD  Multiple Vitamin (MULTIVITAMIN WITH MINERALS) TABS tablet Take 1 tablet by mouth daily. 11/16/22  Yes Leeroy Bock, MD  Nutritional Supplements (FEEDING SUPPLEMENT, GLUCERNA 1.5 CAL,) LIQD Place 640 mLs into feeding tube at bedtime. 11/15/22  Yes Leeroy Bock, MD  zinc gluconate 50 MG tablet Take 1 tablet (50 mg total) by mouth daily. 11/20/22  Yes Rickard Patience, MD  insulin aspart (NOVOLOG) 100 UNIT/ML injection Inject 2 Units into the skin 3 (three) times daily with meals. Patient not taking: Reported on 11/22/2022 08/05/22 01/19/23  Loyce Dys, MD  levothyroxine (SYNTHROID) 50 MCG tablet Take 1 tablet (50 mcg total) by mouth daily at 6 (six) AM. 08/05/22 11/19/22  Loyce Dys, MD  promethazine (PHENERGAN) 25 MG  tablet Take 0.5 tablets (12.5 mg total) by mouth every 6 (six) hours as needed for nausea or vomiting. Patient not taking: Reported on 11/19/2022 08/05/22 11/19/22  Loyce Dys, MD  thiamine (VITAMIN B-1) 100 MG tablet Take 1 tablet (100 mg total) by mouth daily. Patient not taking: Reported on 11/19/2022 11/16/22 12/16/22  Leeroy Bock, MD      VITAL SIGNS:  Blood pressure (!) 145/81, pulse 89, temperature 97.9 F (36.6 C), temperature source Oral, resp. rate 20, height 5\' 1"  (1.549 m), weight 48 kg, last menstrual period 09/07/2022, SpO2 100 %, not currently breastfeeding.  PHYSICAL EXAMINATION:  Physical Exam  GENERAL:  32 y.o.-year-old Caucasian female patient lying in the bed with no acute distress.  EYES: Pupils equal, round, reactive to light and accommodation. No scleral icterus. Extraocular muscles intact.  HEENT: Head atraumatic, normocephalic. Oropharynx and nasopharynx clear.  NECK:  Supple, no jugular venous  distention. No thyroid enlargement, no tenderness.  LUNGS: Normal breath sounds bilaterally, no wheezing, rales,rhonchi or crepitation. No use of accessory muscles of respiration.  CARDIOVASCULAR: Regular rate and rhythm, S1, S2 normal. No murmurs, rubs, or gallops.  ABDOMEN: Soft, nondistended, with upper quadrant tenderness without rebound tenderness guarding or rigidity.  J-tube came out with clean stoma.  Bowel sounds present. No organomegaly or mass.  EXTREMITIES: No pedal edema, cyanosis, or clubbing.  NEUROLOGIC: Cranial nerves II through XII are intact. Muscle strength 5/5 in all extremities. Sensation intact. Gait not checked.  PSYCHIATRIC: The patient is alert and oriented x 3.  Normal affect and good eye contact. SKIN: No obvious rash, lesion, or ulcer.   LABORATORY PANEL:   CBC Recent Labs  Lab 11/22/22 0202  WBC 6.5  HGB 9.3*  HCT 28.7*  PLT 156    ------------------------------------------------------------------------------------------------------------------  Chemistries  Recent Labs  Lab 11/22/22 0202  NA 138  K 4.9  CL 103  CO2 25  GLUCOSE 58*  BUN 30*  CREATININE 2.14*  CALCIUM 8.6*  MG 1.9  AST 22  ALT 17  ALKPHOS 64  BILITOT 0.5   ------------------------------------------------------------------------------------------------------------------  Cardiac Enzymes No results for input(s): "TROPONINI" in the last 168 hours. ------------------------------------------------------------------------------------------------------------------  RADIOLOGY:  No results found.    IMPRESSION AND PLAN:  Assessment and Plan: * AKI (acute kidney injury) (HCC) - The patient will be admitted to a medical observation bed. - She will be hydrated with IV normal saline and will follow BMP. - We will avoid nephrotoxins. - This is clearly secondary to diminished p.o. intake after jejunostomy tube came out.  Dislodged jejunostomy tube - General surgery consult to be obtained for replacement. - Dr. Everlene Farrier was notified about the patient.  Hypothyroidism - We will continue Synthroid.  Type 1 diabetes mellitus without complications (HCC) - We will place him on supplemental coverage with NovoLog and continue basal coverage.  Adrenal insufficiency (Addison's disease) (HCC) - We will continue Cortef.  Essential hypertension - We will continue Norvasc.   DVT prophylaxis: Lovenox.  Advanced Care Planning:  Code Status: full code.  Family Communication:  The plan of care was discussed in details with the patient (and family). I answered all questions. The patient agreed to proceed with the above mentioned plan. Further management will depend upon hospital course. Disposition Plan: Back to previous home environment Consults called: none.  All the records are reviewed and case discussed with ED provider.  Status is:  Observation  I certify that at the time of admission, it is my clinical judgment that the patient will require hospital care extending LESS than 2 midnights.                            Dispo: The patient is from: Home              Anticipated d/c is to: Home              Patient currently is not medically stable to d/c.              Difficult to place patient: No  Hannah Beat M.D on 11/22/2022 at 5:40 AM  [facility]   From 7 PM-7 AM, contact night-coverage [email or web address]  CC: Primary care physician; Pcp, No

## 2022-11-22 NOTE — Inpatient Diabetes Management (Signed)
Inpatient Diabetes Program Recommendations  AACE/ADA: New Consensus Statement on Inpatient Glycemic Control (2015)  Target Ranges:  Prepandial:   less than 140 mg/dL      Peak postprandial:   less than 180 mg/dL (1-2 hours)      Critically ill patients:  140 - 180 mg/dL    Latest Reference Range & Units 11/22/22 03:43 11/22/22 04:16 11/22/22 07:20  Glucose-Capillary 70 - 99 mg/dL 41 (LL) 409 (H) 61 (L)  (LL): Data is critically low (H): Data is abnormally high (L): Data is abnormally low      History: Type 1 diabetes  Home DM Meds: Basaglar 5 units BID       Novolog 0-6 units QID  Current Orders: Semglee 5 units BID      Novolog Sensitive Correction Scale/ SSI (0-9 units) Q4 hours      Novolog 0-6 units TID AC + HS      Novolog 2 units TID with meals    MD- Note Hypoglycemia on admission.  Currently NPO.  Pt has 2 orders for Novolog SSI.  Novolog Meal coverage also ordered.  Please consider:  1. Reduce Semglee to 3 units BID  2. D/C the Novolog 0-9 units Q4 hours  3. Adjust the Novolog 0-6 units SSI to Q4 hours (since pt NPO)  4. Stop the Novolog 2 units TID with meals for now (can resume when pt allowed to eat)    --Will follow patient during hospitalization--  Ambrose Finland RN, MSN, CDCES Diabetes Coordinator Inpatient Glycemic Control Team Team Pager: [phone] (8a-5p)

## 2022-11-22 NOTE — Assessment & Plan Note (Signed)
-   We will place him on supplemental coverage with NovoLog and continue basal coverage.

## 2022-11-22 NOTE — Telephone Encounter (Signed)
Pt will get bx tomorrow inpatient. Please schedule MD only approx 2 weeks after 7/2. Please inform pt of appt.

## 2022-11-22 NOTE — Progress Notes (Signed)
Hypoglycemic Event  CBG: 58 then 51  Treatment: patient was unable to drink the OJ so we had to give IV dextrose  Symptoms: "feel hungry"  Follow-up CBG: Time:12:20pm CBG Result:144  Possible Reasons for Event: Other: patient was NPO  Comments/MD notified:none    Retia Passe

## 2022-11-22 NOTE — Assessment & Plan Note (Addendum)
-   We will continue Cortef. 

## 2022-11-22 NOTE — Assessment & Plan Note (Signed)
-   We will continue Synthroid. 

## 2022-11-22 NOTE — Consult Note (Signed)
[facility] SURGICAL CONSULTATION NOTE (initial) - cpt: [phone]   HISTORY OF PRESENT ILLNESS (HPI):  32 y.o. female known to our service following jejunostomy placement on 09/12/2022 with Dr Everlene Farrier secondary to malnourishment and severe gastroparesis. Last followed up on 06/10 and at that time had been having continued issues with hydration and malnourishment. She was admitted from 06/10 - 06/24 for failure to thrive. She did have a CT at that time which showed adequate positioning of jejunostomy without leak. She did have tube feeds restarted during that admission. She presented to the ED again last night secondary to her jejunostomy tube coming dislodged and falling out. She is unsure how this occurred. She denied any trauma to the area. She states "there were no stitches in there." She has some pain at LUQ jejunostomy site itself. No fever, chills. Work up in the ED revealed a normal WBC at 6.5K, Hgb to 9.5 (improved from DC on 06/24), AKI with sCr - 2.14, no significant electrolyte derangements. No imaging in ED. She was admitted to medicine service. IR consulted for replacement of jejunostomy tube with fluoroscopy.   Surgery is consulted by emergency medicine physician Dr. Delton Prairie, MD in this context for evaluation and management of jejunostomy complication.  PAST MEDICAL HISTORY (PMH):  Past Medical History:  Diagnosis Date   Acute metabolic encephalopathy 06/10/2022   Adrenal insufficiency (HCC)    Anemia    CHF (congestive heart failure) (HCC)    Gastroparesis    Hypertension    Type 1 diabetes (HCC)      PAST SURGICAL HISTORY (PSH):  Past Surgical History:  Procedure Laterality Date   BIOPSY  01/14/2021   Procedure: BIOPSY;  Surgeon: Tressia Danas, MD;  Location: [facility] ENDOSCOPY;  Service: Gastroenterology;;   CESAREAN SECTION     CESAREAN SECTION WITH BILATERAL TUBAL LIGATION N/A 02/17/2021   Procedure: CESAREAN SECTION WITH BILATERAL TUBAL LIGATION;  Surgeon:  [name], DO;  Location: [facility] LD ORS;  Service: Obstetrics;  Laterality: N/A;   ESOPHAGOGASTRODUODENOSCOPY (EGD) WITH PROPOFOL N/A 01/14/2021   Procedure: ESOPHAGOGASTRODUODENOSCOPY (EGD) WITH PROPOFOL;  Surgeon: Tressia Danas, MD;  Location: [facility] ENDOSCOPY;  Service: Gastroenterology;  Laterality: N/A;   JEJUNOSTOMY N/A 06/13/2022   Procedure: JEJUNOSTOMY;  Surgeon: Leafy Ro, MD;  Location: [facility] ORS;  Service: General;  Laterality: N/A;   MOUTH SURGERY       MEDICATIONS:  Prior to Admission medications   Medication Sig Start Date End Date Taking? Authorizing Provider  acetaminophen (TYLENOL) 500 MG tablet Take 2 tablets (1,000 mg total) by mouth every 6 (six) hours as needed for headache, fever or moderate pain. 08/05/22  Yes Loyce Dys, MD  amLODipine (NORVASC) 10 MG tablet Take 1 tablet (10 mg total) by mouth daily. 11/16/22 12/16/22 Yes Leeroy Bock, MD  divalproex (DEPAKOTE ER) 250 MG 24 hr tablet Take 1 tablet (250 mg total) by mouth daily. 08/05/22  Yes Loyce Dys, MD  FLUoxetine (PROZAC) 10 MG capsule Take 1 capsule (10 mg total) by mouth daily. 08/05/22 11/22/22 Yes Djan, Scarlette Calico, MD  hydrocortisone (CORTEF) 10 MG tablet Take 2 tablets (20 mg total) by mouth daily with breakfast AND 1 tablet (10 mg total) daily with supper. 08/05/22  Yes Loyce Dys, MD  insulin aspart (NOVOLOG) 100 UNIT/ML injection Inject 0-6 Units into the skin 4 (four) times daily - after meals and at bedtime. 11/15/22  Yes Leeroy Bock, MD  Insulin Glargine [facility] KWIKPEN) 100 UNIT/ML Inject  5 Units into the skin 2 (two) times daily. 11/15/22 05/22/23 Yes Leeroy Bock, MD  loperamide (IMODIUM) 2 MG capsule Take 2 capsules (4 mg total) by mouth as needed for diarrhea or loose stools. 11/15/22  Yes Leeroy Bock, MD  methocarbamol (ROBAXIN) 500 MG tablet Take 1 tablet (500 mg total) by mouth 2 (two) times daily as needed for muscle spasms. 11/15/22 12/15/22 Yes Leeroy Bock, MD  Multiple Vitamin (MULTIVITAMIN WITH MINERALS) TABS tablet Take 1 tablet by mouth daily. 11/16/22  Yes Leeroy Bock, MD  Nutritional Supplements (FEEDING SUPPLEMENT, GLUCERNA 1.5 CAL,) LIQD Place 640 mLs into feeding tube at bedtime. 11/15/22  Yes Leeroy Bock, MD  zinc gluconate 50 MG tablet Take 1 tablet (50 mg total) by mouth daily. 11/20/22  Yes Rickard Patience, MD  insulin aspart (NOVOLOG) 100 UNIT/ML injection Inject 2 Units into the skin 3 (three) times daily with meals. Patient not taking: Reported on 11/22/2022 08/05/22 01/19/23  Loyce Dys, MD  levothyroxine (SYNTHROID) 50 MCG tablet Take 1 tablet (50 mcg total) by mouth daily at 6 (six) AM. 08/05/22 11/19/22  Loyce Dys, MD  promethazine (PHENERGAN) 25 MG tablet Take 0.5 tablets (12.5 mg total) by mouth every 6 (six) hours as needed for nausea or vomiting. Patient not taking: Reported on 11/19/2022 08/05/22 11/19/22  Loyce Dys, MD  thiamine (VITAMIN B-1) 100 MG tablet Take 1 tablet (100 mg total) by mouth daily. Patient not taking: Reported on 11/19/2022 11/16/22 12/16/22  Leeroy Bock, MD     ALLERGIES:  No Known Allergies   SOCIAL HISTORY:  Social History   Socioeconomic History   Marital status: Single    Spouse name: Not on file   Number of children: Not on file   Years of education: Not on file   Highest education level: Not on file  Occupational History   Occupation: home maker  Tobacco Use   Smoking status: Never    Passive exposure: Never   Smokeless tobacco: Never  Vaping Use   Vaping Use: Never used  Substance and Sexual Activity   Alcohol use: No   Drug use: No   Sexual activity: Yes    Birth control/protection: None  Other Topics Concern   Not on file  Social History Narrative   Not on file   Social Determinants of Health   Financial Resource Strain: Not on file  Food Insecurity: No Food Insecurity (11/22/2022)   Hunger Vital Sign    Worried About Running Out of Food in the Last  Year: Never true    Ran Out of Food in the Last Year: Never true  Transportation Needs: No Transportation Needs (11/22/2022)   PRAPARE - Administrator, Civil Service (Medical): No    Lack of Transportation (Non-Medical): No  Physical Activity: Not on file  Stress: Not on file  Social Connections: Not on file  Intimate Partner Violence: Not At Risk (11/22/2022)   Humiliation, Afraid, Rape, and Kick questionnaire    Fear of Current or Ex-Partner: No    Emotionally Abused: No    Physically Abused: No    Sexually Abused: No     FAMILY HISTORY:  Family History  Problem Relation Age of Onset   Breast cancer Mother    Seizures Mother    Diabetes type I Father    CAD Father    Lung cancer Maternal Grandfather    CAD Paternal Grandmother    CAD Paternal  Grandfather    Ovarian cancer Neg Hx       REVIEW OF SYSTEMS:  Review of Systems  Constitutional:  Negative for chills and fever.  Respiratory:  Negative for cough and shortness of breath.   Cardiovascular:  Negative for chest pain and palpitations.  Gastrointestinal:  Positive for abdominal pain. Negative for constipation, diarrhea and vomiting.  All other systems reviewed and are negative.   VITAL SIGNS:  Temp:  [97.9 F (36.6 C)-100 F (37.8 C)] 98 F (36.7 C) (07/01 0720) Pulse Rate:  [73-109] 73 (07/01 0720) Resp:  [16-20] 16 (07/01 0720) BP: (94-145)/(63-81) 119/70 (07/01 0720) SpO2:  [99 %-100 %] 99 % (07/01 0720) Weight:  [46.3 kg-48 kg] 46.3 kg (07/01 0502)     Height: 5\' 1"  (154.9 cm) Weight: 46.3 kg BMI (Calculated): 19.28   INTAKE/OUTPUT:  06/30 0701 - 07/01 0700 In: 117.3 [I.V.:117.3] Out: -   PHYSICAL EXAM:  Physical Exam Vitals and nursing note reviewed. Exam conducted with a chaperone present.  Constitutional:      General: She is not in acute distress.    Appearance: Normal appearance. She is not ill-appearing.     Comments: Patient resting in bed: NAD  HENT:     Head: Normocephalic and  atraumatic.  Eyes:     General: No scleral icterus.    Conjunctiva/sclera: Conjunctivae normal.  Cardiovascular:     Rate and Rhythm: Normal rate and regular rhythm.     Pulses: Normal pulses.  Pulmonary:     Effort: Pulmonary effort is normal. No respiratory distress.  Abdominal:     General: Abdomen is flat. There is no distension.     Palpations: Abdomen is soft.     Tenderness: There is abdominal tenderness in the left upper quadrant. There is no guarding or rebound.     Comments: Soft, tenderness at previous jejunostomy site in left abdomen, non-distended, no rebound/guarding. At previous jejunostomy site, it is difficult to tell if there is subcutaneous tissues vs small knuckle of bowel there, this is tender.    Genitourinary:    Comments: Deferred Musculoskeletal:     Right lower leg: No edema.     Left lower leg: No edema.  Skin:    General: Skin is warm and dry.     Findings: Erythema present.  Neurological:     General: No focal deficit present.     Mental Status: She is alert and oriented to person, place, and time.  Psychiatric:        Mood and Affect: Mood normal.        Behavior: Behavior normal.      Labs:     Latest Ref Rng & Units 11/22/2022    5:15 AM 11/22/2022    2:02 AM 11/15/2022    5:44 AM  CBC  WBC 4.0 - 10.5 K/uL 5.5  6.5  6.5   Hemoglobin 12.0 - 15.0 g/dL 8.5  9.3  8.5   Hematocrit 36.0 - 46.0 % 26.4  28.7  26.5   Platelets 150 - 400 K/uL 136  156  190       Latest Ref Rng & Units 11/22/2022    5:15 AM 11/22/2022    2:02 AM 11/15/2022    5:44 AM  CMP  Glucose 70 - 99 mg/dL 161  58  096   BUN 6 - 20 mg/dL 30  30  61   Creatinine 0.44 - 1.00 mg/dL 0.45  4.09  8.11   Sodium 135 -  145 mmol/L 135  138  140   Potassium 3.5 - 5.1 mmol/L 4.8  4.9  5.4   Chloride 98 - 111 mmol/L 103  103  106   CO2 22 - 32 mmol/L 23  25  27    Calcium 8.9 - 10.3 mg/dL 8.2  8.6  8.4   Total Protein 6.5 - 8.1 g/dL  6.4    Total Bilirubin 0.3 - 1.2 mg/dL  0.5    Alkaline  Phos 38 - 126 U/L  64    AST 15 - 41 U/L  22    ALT 0 - 44 U/L  17       Imaging studies:  No new imaging studies    Assessment/Plan:  32 y.o. female whose jejunostomy tube dislodged, complicated by pertinent comorbidities including malnourishment, failure to thrive, severe gastroparesis.   - Appreciate medicine admission  - Appreciate IR assistance; plan for attempted replacement of jejunostomy via fluoroscopy today  - No emergent surgical intervention; If less invasive measures fail, may need to consider replacement of this surgically.   - NPO for planned IR procedure - IVF hydration; monitor renal function    - Monitor abdominal examination - Pain control prn; antiemetics prn - Further management per primary service    All of the above findings and recommendations were discussed with the patient, and all of patient's questions were answered to her expressed satisfaction.  Thank you for the opportunity to participate in this patient's care.   -- Lynden Oxford, PA-C [facility] 11/22/2022, 8:15 AM M-F: 7am - 4pm

## 2022-11-22 NOTE — Progress Notes (Signed)
PHARMACIST - PHYSICIAN COMMUNICATION  CONCERNING:  Enoxaparin (Lovenox) for DVT Prophylaxis    RECOMMENDATION: Patient was prescribed enoxaprin 40mg  q24 hours for VTE prophylaxis.   Filed Weights   11/22/22 0049  Weight: 48 kg (105 lb 13.1 oz)    Body mass index is 19.99 kg/m.  Estimated Creatinine Clearance: 28.7 mL/min (A) (by C-G formula based on SCr of 2.14 mg/dL (H)).  Patient is candidate for enoxaparin 30mg  every 24 hours based on CrCl <31ml/min or Weight <45kg  DESCRIPTION: Pharmacy has adjusted enoxaparin dose per [facility] policy.  Patient is now receiving enoxaparin 30 mg every 24 hours   Otelia Sergeant, PharmD, [facility] 11/22/2022 4:06 AM

## 2022-11-22 NOTE — ED Provider Notes (Addendum)
[facility] Provider Note    Event Date/Time   First MD Initiated Contact with Patient 11/22/22 [phone]     (approximate)   History   G tube complication   HPI  Ashley Camacho is a 32 y.o. female who presents to the ED for evaluation of G tube complication   I review 6/24 medical DC summary.  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  Patient presents to the ED for elevation of her J-tube falling out, dizziness and poor appetite.  Reports poor toleration of intake without emesis.   Physical Exam   Triage Vital Signs: ED Triage Vitals [11/22/22 0049]  Enc Vitals Group     BP 94/63     Pulse Rate (!) 109     Resp 16     Temp 100 F (37.8 C)     Temp src      SpO2 99 %     Weight 105 lb 13.1 oz (48 kg)     Height 5\' 1"  (1.549 m)     Head Circumference      Peak Flow      Pain Score 0     Pain Loc      Pain Edu?      Excl. in GC?     Most recent vital signs: Vitals:   11/22/22 0049  BP: 94/63  Pulse: (!) 109  Resp: 16  Temp: 100 F (37.8 C)  SpO2: 99%    General: Awake, no distress.  CV:  Good peripheral perfusion.  Resp:  Normal effort.  Abd:  No distention.  Diffuse tenderness without peritoneal features.  J-tube insertion site without discharge MSK:  No deformity noted.  Neuro:  No focal deficits appreciated. Other:     ED Results / Procedures / Treatments   Labs (all labs ordered are listed, but only abnormal results are displayed) Labs Reviewed  COMPREHENSIVE METABOLIC PANEL - Abnormal; Notable for the following components:      Result Value   Glucose, Bld 58 (*)    BUN 30 (*)    Creatinine, Ser 2.14 (*)    Calcium 8.6 (*)    Total Protein 6.4 (*)    GFR, Estimated 31 (*)    All other components within normal limits  CBC WITH DIFFERENTIAL/PLATELET - Abnormal; Notable for the following components:   RBC 3.10 (*)    Hemoglobin 9.3 (*)    HCT 28.7 (*)    All other components within normal  limits  CBG MONITORING, ED - Abnormal; Notable for the following components:   Glucose-Capillary 41 (*)    All other components within normal limits  MAGNESIUM  LIPASE, BLOOD  URINALYSIS, ROUTINE W REFLEX MICROSCOPIC    EKG   RADIOLOGY   Official radiology report(s): No results found.  PROCEDURES and INTERVENTIONS:  .1-3 Lead EKG Interpretation  Performed by: Delton Prairie, MD Authorized by: Delton Prairie, MD     Interpretation: abnormal     ECG rate:  100   ECG rate assessment: tachycardic     Rhythm: sinus tachycardia     Ectopy: none     Conduction: normal   .Critical Care  Performed by: Delton Prairie, MD Authorized by: Delton Prairie, MD   Critical care provider statement:    Critical care time (minutes):  30   Critical care time was exclusive of:  Separately billable procedures and treating other patients   Critical care was necessary to treat or prevent  imminent or life-threatening deterioration of the following conditions:  Metabolic crisis and endocrine crisis   Critical care was time spent personally by me on the following activities:  Development of treatment plan with patient or surrogate, discussions with consultants, evaluation of patient's response to treatment, examination of patient, ordering and review of laboratory studies, ordering and review of radiographic studies, ordering and performing treatments and interventions, pulse oximetry, re-evaluation of patient's condition and review of old charts   Medications  lactated ringers bolus 1,000 mL (1,000 mLs Intravenous New Bag/Given 11/22/22 0204)  dextrose 50 % solution 50 mL (50 mLs Intravenous Given 11/22/22 0346)     IMPRESSION / MDM / ASSESSMENT AND PLAN / ED COURSE  I reviewed the triage vital signs and the nursing notes.  Differential diagnosis includes, but is not limited to, J-tube malfunction, AKI, dehydration, UTI  {Patient presents with symptoms of an acute illness or injury that is potentially  life-threatening.  Patient presents with J-tube malfunction, dizziness and poor intake with signs of an AKI requiring an admission for rehydration and J-tube replacement.  Tachycardic and borderline temperature, but no infectious preceding symptoms.  Awaiting urinalysis.  No white count or other SIRS criteria.  Increased creatinine consistent with AKI due to her poor intake.  Will provide IV fluids, oral glucose.  As below I attempted to replace PEG tube but this was poorly tolerated so I stopped.  Consult with medicine for admission  Clinical Course as of 11/22/22 0346  Mon Nov 22, 2022  0153 I consult general surgery, Dr. Everlene Farrier, who recognizes this patient and recommends trying just a bedside G-tube first and she can follow-up as an outpatient if this is functional [DS]  0259 I attempted to pass 14Fr PEG tube.  Unable to pass it easily enough without significant discomfort.  When trying to inflate the balloon though it causes significant pain so I stopped.  I attempt to reposition, which causes no pain, but every time I tried to inflate the balloon it causes discomfort so I remove and place gauze.  No apparent complication or bleeding [DS]  0335 Consult with medicine who agrees to admit [DS]  0346 I reassessed and educated the patient on plan for admission.  She reports sensation of hypoglycemia despite finishing a cup of orange juice.  Nursing rechecked her CBG and it is in the 40s.  Will provide D50. [DS]    Clinical Course User Index [DS] Delton Prairie, MD     FINAL CLINICAL IMPRESSION(S) / ED DIAGNOSES   Final diagnoses:  AKI (acute kidney injury) (HCC)  Jejunostomy tube fell out     Rx / DC Orders   ED Discharge Orders     None        Note:  This document was prepared using Dragon voice recognition software and may include unintentional dictation errors.   Delton Prairie, MD 11/22/22 1610    Delton Prairie, MD 11/22/22 [phone]

## 2022-11-22 NOTE — Assessment & Plan Note (Signed)
Continue Norvasc

## 2022-11-23 DIAGNOSIS — E43 Unspecified severe protein-calorie malnutrition: Secondary | ICD-10-CM | POA: Diagnosis not present

## 2022-11-23 DIAGNOSIS — Z8249 Family history of ischemic heart disease and other diseases of the circulatory system: Secondary | ICD-10-CM | POA: Diagnosis not present

## 2022-11-23 DIAGNOSIS — Z801 Family history of malignant neoplasm of trachea, bronchus and lung: Secondary | ICD-10-CM | POA: Diagnosis not present

## 2022-11-23 DIAGNOSIS — I5032 Chronic diastolic (congestive) heart failure: Secondary | ICD-10-CM | POA: Diagnosis not present

## 2022-11-23 DIAGNOSIS — E10641 Type 1 diabetes mellitus with hypoglycemia with coma: Secondary | ICD-10-CM | POA: Diagnosis not present

## 2022-11-23 DIAGNOSIS — I110 Hypertensive heart disease with heart failure: Secondary | ICD-10-CM | POA: Diagnosis not present

## 2022-11-23 DIAGNOSIS — Z681 Body mass index (BMI) 19 or less, adult: Secondary | ICD-10-CM | POA: Diagnosis not present

## 2022-11-23 DIAGNOSIS — Z794 Long term (current) use of insulin: Secondary | ICD-10-CM | POA: Diagnosis not present

## 2022-11-23 DIAGNOSIS — Z9851 Tubal ligation status: Secondary | ICD-10-CM | POA: Diagnosis not present

## 2022-11-23 DIAGNOSIS — Z434 Encounter for attention to other artificial openings of digestive tract: Secondary | ICD-10-CM | POA: Diagnosis not present

## 2022-11-23 DIAGNOSIS — E1043 Type 1 diabetes mellitus with diabetic autonomic (poly)neuropathy: Secondary | ICD-10-CM | POA: Diagnosis not present

## 2022-11-23 DIAGNOSIS — E271 Primary adrenocortical insufficiency: Secondary | ICD-10-CM | POA: Diagnosis not present

## 2022-11-23 DIAGNOSIS — Z79899 Other long term (current) drug therapy: Secondary | ICD-10-CM | POA: Diagnosis not present

## 2022-11-23 DIAGNOSIS — N179 Acute kidney failure, unspecified: Secondary | ICD-10-CM | POA: Diagnosis not present

## 2022-11-23 DIAGNOSIS — Z833 Family history of diabetes mellitus: Secondary | ICD-10-CM | POA: Diagnosis not present

## 2022-11-23 DIAGNOSIS — Z803 Family history of malignant neoplasm of breast: Secondary | ICD-10-CM | POA: Diagnosis not present

## 2022-11-23 DIAGNOSIS — Z79890 Hormone replacement therapy (postmenopausal): Secondary | ICD-10-CM | POA: Diagnosis not present

## 2022-11-23 DIAGNOSIS — R627 Adult failure to thrive: Secondary | ICD-10-CM | POA: Diagnosis present

## 2022-11-23 DIAGNOSIS — E10649 Type 1 diabetes mellitus with hypoglycemia without coma: Secondary | ICD-10-CM | POA: Diagnosis not present

## 2022-11-23 DIAGNOSIS — K3184 Gastroparesis: Secondary | ICD-10-CM | POA: Diagnosis not present

## 2022-11-23 DIAGNOSIS — E039 Hypothyroidism, unspecified: Secondary | ICD-10-CM | POA: Diagnosis not present

## 2022-11-23 DIAGNOSIS — K9423 Gastrostomy malfunction: Secondary | ICD-10-CM | POA: Diagnosis not present

## 2022-11-23 DIAGNOSIS — T85528A Displacement of other gastrointestinal prosthetic devices, implants and grafts, initial encounter: Secondary | ICD-10-CM | POA: Diagnosis not present

## 2022-11-23 DIAGNOSIS — E1065 Type 1 diabetes mellitus with hyperglycemia: Secondary | ICD-10-CM | POA: Diagnosis not present

## 2022-11-23 LAB — GLUCOSE, CAPILLARY
Glucose-Capillary: 105 mg/dL — ABNORMAL HIGH (ref 70–99)
Glucose-Capillary: 115 mg/dL — ABNORMAL HIGH (ref 70–99)
Glucose-Capillary: 188 mg/dL — ABNORMAL HIGH (ref 70–99)
Glucose-Capillary: 22 mg/dL — CL (ref 70–99)
Glucose-Capillary: 28 mg/dL — CL (ref 70–99)
Glucose-Capillary: 323 mg/dL — ABNORMAL HIGH (ref 70–99)
Glucose-Capillary: 39 mg/dL — CL (ref 70–99)
Glucose-Capillary: 427 mg/dL — ABNORMAL HIGH (ref 70–99)
Glucose-Capillary: 439 mg/dL — ABNORMAL HIGH (ref 70–99)
Glucose-Capillary: 439 mg/dL — ABNORMAL HIGH (ref 70–99)

## 2022-11-23 LAB — BASIC METABOLIC PANEL
Anion gap: 7 (ref 5–15)
Anion gap: 7 (ref 5–15)
BUN: 41 mg/dL — ABNORMAL HIGH (ref 6–20)
BUN: 36 mg/dL — ABNORMAL HIGH (ref 6–20)
CO2: 21 mmol/L — ABNORMAL LOW (ref 22–32)
CO2: 22 mmol/L (ref 22–32)
Calcium: 7.5 mg/dL — ABNORMAL LOW (ref 8.9–10.3)
Calcium: 8 mg/dL — ABNORMAL LOW (ref 8.9–10.3)
Chloride: 105 mmol/L (ref 98–111)
Chloride: 108 mmol/L (ref 98–111)
Creatinine, Ser: 1.79 mg/dL — ABNORMAL HIGH (ref 0.44–1.00)
Creatinine, Ser: 1.7 mg/dL — ABNORMAL HIGH (ref 0.44–1.00)
GFR, Estimated: 38 mL/min — ABNORMAL LOW (ref >60)
GFR, Estimated: 41 mL/min — ABNORMAL LOW (ref >60)
Glucose, Bld: 376 mg/dL — ABNORMAL HIGH (ref 70–99)
Glucose, Bld: 335 mg/dL — ABNORMAL HIGH (ref 70–99)
Potassium: 5.4 mmol/L — ABNORMAL HIGH (ref 3.5–5.1)
Potassium: 4.9 mmol/L (ref 3.5–5.1)
Sodium: 133 mmol/L — ABNORMAL LOW (ref 135–145)
Sodium: 137 mmol/L (ref 135–145)

## 2022-11-23 LAB — URINALYSIS, ROUTINE W REFLEX MICROSCOPIC
Bacteria, UA: NONE SEEN
Bilirubin Urine: NEGATIVE
Glucose, UA: >=500 mg/dL — AB
Hgb urine dipstick: NEGATIVE
Ketones, ur: NEGATIVE mg/dL
Leukocytes,Ua: NEGATIVE
Nitrite: NEGATIVE
Protein, ur: 30 mg/dL — AB
Specific Gravity, Urine: 1.009 (ref 1.005–1.030)
pH: 8 (ref 5.0–8.0)

## 2022-11-23 LAB — CBC
HCT: 24.6 % — ABNORMAL LOW (ref 36.0–46.0)
Hemoglobin: 8.2 g/dL — ABNORMAL LOW (ref 12.0–15.0)
MCH: 30 pg (ref 26.0–34.0)
MCHC: 33.3 g/dL (ref 30.0–36.0)
MCV: 90.1 fL (ref 80.0–100.0)
Platelets: 109 10*3/uL — ABNORMAL LOW (ref 150–400)
RBC: 2.73 MIL/uL — ABNORMAL LOW (ref 3.87–5.11)
RDW: 13.8 % (ref 11.5–15.5)
WBC: 3.5 10*3/uL — ABNORMAL LOW (ref 4.0–10.5)
nRBC: 0 % (ref 0.0–0.2)

## 2022-11-23 MED ORDER — ENOXAPARIN SODIUM 40 MG/0.4ML IJ SOSY
40.0000 mg | PREFILLED_SYRINGE | INTRAMUSCULAR | Status: DC
  Administered 2022-11-27: 40 mg via SUBCUTANEOUS
  Filled 2022-11-23 (×6): qty 0.4

## 2022-11-23 MED ORDER — DEXTROSE-SODIUM CHLORIDE 5-0.9 % IV SOLN: INTRAVENOUS | Status: DC

## 2022-11-23 MED ORDER — GLUCERNA 1.5 CAL PO LIQD: 1000.0000 mL | ORAL | Status: DC

## 2022-11-23 MED ORDER — ADULT MULTIVITAMIN LIQUID CH
15.0000 mL | Freq: Every day | ORAL | Status: DC
  Administered 2022-11-24 – 2022-11-28 (×5): 15 mL
  Filled 2022-11-23 (×5): qty 15

## 2022-11-23 MED ORDER — SODIUM CHLORIDE 0.9 % IV SOLN
12.5000 mg | Freq: Four times a day (QID) | INTRAVENOUS | Status: DC | PRN
  Administered 2022-11-23 – 2022-11-24 (×2): 12.5 mg via INTRAVENOUS
  Filled 2022-11-23: qty 12.5
  Filled 2022-11-23: qty 0.5

## 2022-11-23 MED ORDER — FREE WATER
50.0000 mL | Status: DC
  Administered 2022-11-24 – 2022-11-28 (×26): 50 mL

## 2022-11-23 MED ORDER — LOPERAMIDE HCL 2 MG PO CAPS
2.0000 mg | ORAL_CAPSULE | ORAL | Status: DC | PRN
  Administered 2022-11-23: 2 mg via ORAL
  Filled 2022-11-23: qty 1

## 2022-11-23 MED ORDER — INSULIN ASPART 100 UNIT/ML IJ SOLN
0.0000 [IU] | Freq: Three times a day (TID) | INTRAMUSCULAR | Status: DC
  Administered 2022-11-23: 20 [IU] via SUBCUTANEOUS
  Administered 2022-11-23: 15 [IU] via SUBCUTANEOUS
  Administered 2022-11-23: 20 [IU] via SUBCUTANEOUS
  Administered 2022-11-24: 15 [IU] via SUBCUTANEOUS
  Filled 2022-11-23 (×4): qty 1

## 2022-11-23 MED ORDER — GLUCERNA 1.5 CAL PO LIQD
1000.0000 mL | ORAL | Status: DC
  Administered 2022-11-23 – 2022-11-27 (×5): 1000 mL

## 2022-11-23 NOTE — Progress Notes (Signed)
Initial Nutrition Assessment  DOCUMENTATION CODES:   Severe malnutrition in context of chronic illness  INTERVENTION:   Nocturnal tube feeds:   Glucerna 1.5@70ml /hr x 14 hrs overnight (1800-0800)  Free water flushes 50ml q4 hours to maintain tube patency   Regimen provides 1470kcal/day, 81g/day protein and 10109ml/day of free water.   Glucerna Shake po TID, each supplement provides 220 kcal and 10 grams of protein  Liquid MVI daily via tube   Zinc 220mg  po daily x 30 days   Pt at high refeed risk; recommend monitor potassium, magnesium and phosphorus labs daily until stable  Low fiber diet   Daily weights   Check copper lab  NUTRITION DIAGNOSIS:   Severe Malnutrition related to chronic illness (uncontrolled DM, gastroparesis) as evidenced by severe fat depletion, severe muscle depletion.  GOAL:   Patient will meet greater than or equal to 90% of their needs -not met   MONITOR:   PO intake, Supplement acceptance, Labs, Weight trends, TF tolerance, Skin, I & O's  REASON FOR ASSESSMENT:   Consult Enteral/tube feeding initiation and management  ASSESSMENT:   32 y.o. caucasian female with medical history significant for NICM, CHF, DVT, esophageal ulcer, PEA arrest, C. Diff, CKD III, type 1 diabetes mellitus on insulin pump, hypertension, diabetic gastroparesis requiring J-tube (s/p open placement 06/13/22 and replacement of a new 16 Fr baloon retention J tube via existing stoma 7/1), GERD, anemia, adrenal insufficiency, hyperemesis in pregnancy and shingles who is admitted with AKI and dislodged jejunostomy tube.  Met with pt in room today. Pt is well known to this RD from previous admissions. Pt with severe gastroparesis and chronic malnutrition that lead to J-tube placement in January. Pt has been non-compliant with her nocturnal J-tube feedings at home. Pt reports that she often does not use the J-tube for feedings because with her children, she is unable to get on a  convenient schedule. Pt reports that she has been drinking Glucerna supplements at home (strawberry or vanilla) without nausea; pt reports that she usually drinks 1-2 per day. Pt reports that she has been eating some foods by mouth, but reports that she will often become nauseated and vomit afterwards. Pt ate an English muffin and a few bites of egg for breakfast this morning but reports nausea afterwards. Pt did not eat any lunch but reports that she is planning to eat supper.   Tube feeds held overnight as pt complaining of nausea. RD explained to patient that the tip of her feeding tube is located in the jejunum and should not cause any nausea or vomiting and that nausea is likely coming from her gastroparesis. Pt previously on reglan and bentyl but reports that this did not help with her nausea. Erythromycin does help with nausea but is not able to be taken long term. Pt reports that she is unable to tolerate her tube feeds past 71ml/hr r/t nausea. Pt also reports diarrhea daily with tube feeds.   RD discussed with pt the importance of adequate nutrition needed to preserve lean muscle. Explained to patient the importance of trying to get 4-5 cartons of tube feeds in daily. RD did explain that patient is able to drink the tube feed formula if she can tolerate without vomiting. Pt is willing to restart nocturnal tube feeds as long as the rate does not go over 69ml/hr.   RD will add oral supplements to help pt meet her estimated needs. Pt is at high refeed risk. Diarrhea is likely r/t pt's decreased  absorptive space secondary to J-tube. RD offered patient to try and switch to [employer] formula to help with diarrhea but pt prefers that Glucerna; will need to keep and eye on patient's potassium levels. Pt may benefit from J-tube to G-tube exchange as pt is able to tolerate drinking the Glucerna without nausea and vomiting and the tube feed formula is liquid and should digest quickly. This would allow pt to  have freedom from the pump and help with compliance. This would also help with diarrhea.   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    Per chart, pt is down ~4lbs(3%) over the past 2 weeks; this is significant weight loss.     Medications reviewed and include: lovenox, cortef, insulin, synthroid, MVI, zinc, NaCl w/ 5% dextrose @75ml /hr  Labs reviewed: Na 133(L), K 5.4(H), BUN 41(H), creat 1.79(H) Mg 1.9 wnl- 7/1 Zinc 34(L)- 6/22 Wbc- 3.5(L), Hgb 8.2(L), Hct 24.6(L) Cbgs- 188, 39, 105, 22, 28, 439 x 24 hrs  AIC 9.4(H)- 6/11  NUTRITION - FOCUSED PHYSICAL EXAM:  Flowsheet Row Most Recent Value  Orbital Region Mild depletion  Upper Arm Region Severe depletion  Thoracic and Lumbar Region Moderate depletion  Buccal Region Mild depletion  Temple Region Mild depletion  Clavicle Bone Region Severe depletion  Clavicle and Acromion Bone Region Severe depletion  Scapular Bone Region Moderate depletion  Dorsal Hand Severe depletion  Patellar Region Moderate depletion  Anterior Thigh Region Moderate depletion  Posterior Calf Region Moderate depletion  Edema (RD Assessment) None  Hair Reviewed  Eyes Reviewed  Mouth Reviewed  Skin Reviewed  Nails Reviewed   Diet Order:   Diet Order             Diet regular Room service appropriate? Yes; Fluid consistency: Thin  Diet effective now                  EDUCATION NEEDS:   Education needs have been addressed  Skin:  Skin Assessment: Reviewed RN Assessment (ecchymosis)  Last BM:  6/30  Height:   Ht Readings from Last 1 Encounters:  11/22/22 5\' 1"  (1.549 m)    Weight:   Wt Readings from Last 1 Encounters:  11/23/22 47.8 kg   BMI:  Body mass index is 19.91 kg/m.  Estimated Nutritional Needs:   Kcal:  1600-1800kcal/day  Protein:  80-90g/day  Fluid:  1.4-1.6L/day  Betsey Holiday MS,  RD, LDN Please refer to [facility] for RD and/or RD on-call/weekend/after hours pager

## 2022-11-23 NOTE — Inpatient Diabetes Management (Addendum)
Inpatient Diabetes Program Recommendations  AACE/ADA: New Consensus Statement on Inpatient Glycemic Control (2015)  Target Ranges:  Prepandial:   less than 140 mg/dL      Peak postprandial:   less than 180 mg/dL (1-2 hours)      Critically ill patients:  140 - 180 mg/dL    Latest Reference Range & Units 11/22/22 03:43 11/22/22 04:16 11/22/22 07:20 11/22/22 08:00 11/22/22 11:23 11/22/22 11:47 11/22/22 12:22 11/22/22 15:46 11/22/22 20:00  Glucose-Capillary 70 - 99 mg/dL 41 (LL) 161 (H) 61 (L) 201 (H)  2 units Novolog @1023   5 units Semglee @1023  51 (L) 58 (L) 144 (H) 222 (H)  4 units Novolog @1740  359 (H)     3 units Semglee @2125   (LL): Data is critically low (H): Data is abnormally high (L): Data is abnormally low  Latest Reference Range & Units 11/22/22 23:48 11/23/22 04:24 11/23/22 08:18 11/23/22 08:23 11/23/22 08:48  Glucose-Capillary 70 - 99 mg/dL 096 (H) 045 (H)  20 units Novolog @0531  28 (LL) 22 (LL) 105 (H)    History: Type 1 diabetes   Home DM Meds: Basaglar 5 units BID                             Novolog 0-6 units QID   Current Orders: Semglee 3 units BID                            Novolog 0-20 units TID AC + HS                            Novolog 2 units TID with meals    MD- Note Nocturnal tube feeds (Glucerna 80cc/hr to run from 10pm to 6am) started at 10pm last night.  CBGs rose during the night as a result  Novolog SSI was increased to the 0-20 Resistant scale overnight--Concern this will be too heavy of a scale for patient  Please consider:  1. Reduce the Novolog SSI to 0-9 unit scale and increase the frequency of the Novolog SSI to Q4 hours  2. Continue the Novolog 2 units TID with meals (solid meals)  3. Start Novolog Tube Feed Coverage overnight: Novolog 2 units to be given at 12am and 4am  4. Increase frequency of CBG checks to Q4 hours    --Will follow patient during hospitalization--  Ambrose Finland RN, MSN, CDCES Diabetes  Coordinator Inpatient Glycemic Control Team Team Pager: [phone] (8a-5p)

## 2022-11-23 NOTE — Assessment & Plan Note (Signed)
Recurrent. Pt's glucose is extremely labile.   --Hypoglycemia protocol --On D5-NS fluids - continue until taking PO intake well

## 2022-11-23 NOTE — Hospital Course (Signed)
HPI on admission 11/22/22 by Dr. Arville Care:  "Ashley Camacho is a 32 y.o. female with medical history significant for type 1 diabetes mellitus with gastroparesis s/p G-tube placement, CHF, hypertension, and adrenal insufficiency, who presented to the emergency room with acute onset of falling off J-tube.  She denies any nausea or vomiting however has been having left upper quadrant abdominal pain "   ED evaluation -- hypoglycemic with glucose 58, Cr 2.14 (up from 1.25 on 6/24), Hbg 9.3 (up from prior 8.5).  She was treated with D50 and IV fluids in the ER.  ED provider attempted to replace the J tube but was unsuccessful.   Admitted to medicine service with surgery consulted for replacement of the J tube.    7/1 -- IR was able to replace J tube.  Nocturnal tube feeds were initiated overnight.  7/2 -- pt developed nausea/vomting overnight, tube feeds were stopped.  AM glucose was 28 >> 22. Treated with D50 and started on D5-NS fluids.  Later in the day, pt was tolerating food again.

## 2022-11-23 NOTE — Progress Notes (Signed)
PHARMACIST - PHYSICIAN COMMUNICATION  CONCERNING:  Enoxaparin (Lovenox) for DVT Prophylaxis    RECOMMENDATION: Patient was prescribed enoxaprin 30mg  q24 hours for VTE prophylaxis.   Filed Weights   11/22/22 0049 11/22/22 0502 11/23/22 0500  Weight: 48 kg (105 lb 13.1 oz) 46.3 kg (102 lb) 47.8 kg (105 lb 6.1 oz)    Body mass index is 19.91 kg/m.  Estimated Creatinine Clearance: 34.4 mL/min (A) (by C-G formula based on SCr of 1.79 mg/dL (H)).   Based on [facility] policy patient is candidate for enoxaparin 40mg  SQ every 24 hours based on CrCl > 30 ml/min.   DESCRIPTION: Pharmacy has adjusted enoxaparin dose per [facility] policy.  Patient is now receiving enoxaparin 40 mg every 24 hours    Barrie Folk, PharmD Clinical Pharmacist  11/23/2022 7:26 AM

## 2022-11-23 NOTE — Progress Notes (Signed)
Hypoglycemic Event  CBG: 39   Treatment: 8 oz juice/soda and D50 50 mL (25 gm)  Symptoms: None  Follow-up CBG: Time:1141 CBG Result:188  Possible Reasons for Event: Inadequate meal intake  Comments/MD notified:MD at bedside, no new orders    Ashley Camacho

## 2022-11-23 NOTE — Progress Notes (Signed)
Hypoglycemic Event  CBG: 28  Treatment: D50 50 mL (25 gm)  Symptoms: Pale and None  Follow-up CBG: RUEA:5409 CBG Result:105   Possible Reasons for Event: Vomiting and Inadequate meal intake  Comments/MD notified:fluids changed to add D5W    Ilean China

## 2022-11-23 NOTE — Progress Notes (Addendum)
Progress Note   Patient: Ashley Camacho:[id number] DOB: [birth year] DOA: 11/22/2022     0 DOS: the patient was seen and examined on 11/23/2022   Brief hospital course: HPI on admission 11/22/22 by Dr. Arville Care:  "[name] is a 32 y.o. female with medical history significant for type 1 diabetes mellitus with gastroparesis s/p G-tube placement, CHF, hypertension, and adrenal insufficiency, who presented to the emergency room with acute onset of falling off J-tube.  She denies any nausea or vomiting however has been having left upper quadrant abdominal pain "   ED evaluation -- hypoglycemic with glucose 58, Cr 2.14 (up from 1.25 on 6/24), Hbg 9.3 (up from prior 8.5).  She was treated with D50 and IV fluids in the ER.  ED provider attempted to replace the J tube but was unsuccessful.   Admitted to medicine service with surgery consulted for replacement of the J tube.    7/1 -- IR was able to replace J tube.  Nocturnal tube feeds were initiated overnight.  7/2 -- pt developed nausea/vomting overnight, tube feeds were stopped.  AM glucose was 28 >> 22. Treated with D50 and started on D5-NS fluids.  Later in the day, pt was tolerating food again.  Assessment and Plan: * Dislodged jejunostomy tube IR was able to replace the tube 7/1. RD consulted for nocturnal tube feeds. Pt does nocturnal feeds at home, recently at 60 cc/hr bc she's been tolerating PO meals recently. Developed N/V with tube feeds at 80 /hr last night. --will lower rate of feeds to 60 cc/hr  Hypoglycemia Recurrent. Pt's glucose is extremely labile.   --Hypoglycemia protocol --On D5-NS fluids - continue until taking PO intake well  AKI (acute kidney injury) (HCC) Cr on admission 2.14, up from prior 1.25.   Improving with hydration. --Continue IV fluids --Monitor BMP  Hypothyroidism Continue Synthroid  Type 1 diabetes mellitus without complications (HCC) Sliding scale Novolog, very sensitive 2 units Novolog TID  WC Semglee 3 units BID Appreciate diabetes coordinator input  Adrenal insufficiency (Addison's disease) (HCC) Continue Cortef  Essential hypertension Continue Norvasc        Subjective: Pt awake resting in bed today.  Nausea/vomiting better than earlier this AM, was able to eat some of her breakfast a short while ago.  She doesn't remember eating applesauce (for PO meds) earlier, probably from low sugar.  She states sugars at home are labile similar to here (was 376 around 5AM, then 28 around 8 AM just few hours later)  Physical Exam: Vitals:   11/23/22 0423 11/23/22 0500 11/23/22 0817 11/23/22 1557  BP: 115/76  (!) 91/58 138/81  Pulse: 75  78 81  Resp: 20  16 16   Temp: 97.6 F (36.4 C)  (!) 97.5 F (36.4 C) 97.6 F (36.4 C)  TempSrc: Oral     SpO2: 100%  100% 100%  Weight:  47.8 kg    Height:       General exam: awake, alert, no acute distress HEENT: moist mucus membranes, hearing grossly normal  Respiratory system: CTAB, no wheezes, rales or rhonchi, normal respiratory effort. Cardiovascular system: normal S1/S2, RRR, no JVD, murmurs, rubs, gallops, no pedal edema.   Gastrointestinal system: soft, NT, ND, no HSM felt, +bowel sounds. Central nervous system: A&O x 4. no gross focal neurologic deficits, normal speech Extremities: moves all , no edema, normal tone Skin: dry, intact, normal temperature, normal color, No rashes, lesions or ulcers Psychiatry: normal mood, congruent affect, judgement and insight appear  normal   Data Reviewed:  Notable labs -- Na 133, K 5.4, bicarb 21, glucose 376 (CBG's 28 >> 22 >> 105 >> 39 >> 188), BUN 30 stable, Cr 2.14 >> 1.93, Hbg 8.2   Family Communication: None  Disposition: Status is: Inpatient Remains inpatient appropriate because: Remains on IV fluids, hypoglycemic episodes, N/V. Discharge in 24-48 hours if stable and clinically improved   Planned Discharge Destination: Home    Time spent: 46 minutes  Author: Pennie Banter, DO 11/23/2022 4:18 PM  For on call review [email or web address].

## 2022-11-24 DIAGNOSIS — T85528A Displacement of other gastrointestinal prosthetic devices, implants and grafts, initial encounter: Secondary | ICD-10-CM | POA: Diagnosis not present

## 2022-11-24 LAB — CBC
HCT: 24.9 % — ABNORMAL LOW (ref 36.0–46.0)
Hemoglobin: 8.1 g/dL — ABNORMAL LOW (ref 12.0–15.0)
MCH: 30 pg (ref 26.0–34.0)
MCHC: 32.5 g/dL (ref 30.0–36.0)
MCV: 92.2 fL (ref 80.0–100.0)
Platelets: 125 10*3/uL — ABNORMAL LOW (ref 150–400)
RBC: 2.7 MIL/uL — ABNORMAL LOW (ref 3.87–5.11)
RDW: 14.3 % (ref 11.5–15.5)
WBC: 5.2 10*3/uL (ref 4.0–10.5)
nRBC: 0 % (ref 0.0–0.2)

## 2022-11-24 LAB — GLUCOSE, CAPILLARY
Glucose-Capillary: 127 mg/dL — ABNORMAL HIGH (ref 70–99)
Glucose-Capillary: 136 mg/dL — ABNORMAL HIGH (ref 70–99)
Glucose-Capillary: 139 mg/dL — ABNORMAL HIGH (ref 70–99)
Glucose-Capillary: 235 mg/dL — ABNORMAL HIGH (ref 70–99)
Glucose-Capillary: 308 mg/dL — ABNORMAL HIGH (ref 70–99)
Glucose-Capillary: 346 mg/dL — ABNORMAL HIGH (ref 70–99)
Glucose-Capillary: 58 mg/dL — ABNORMAL LOW (ref 70–99)
Glucose-Capillary: 62 mg/dL — ABNORMAL LOW (ref 70–99)

## 2022-11-24 LAB — BASIC METABOLIC PANEL
Anion gap: 6 (ref 5–15)
BUN: 38 mg/dL — ABNORMAL HIGH (ref 6–20)
CO2: 25 mmol/L (ref 22–32)
Calcium: 8 mg/dL — ABNORMAL LOW (ref 8.9–10.3)
Chloride: 110 mmol/L (ref 98–111)
Creatinine, Ser: 1.71 mg/dL — ABNORMAL HIGH (ref 0.44–1.00)
GFR, Estimated: 41 mL/min — ABNORMAL LOW (ref >60)
Glucose, Bld: 92 mg/dL (ref 70–99)
Potassium: 5.5 mmol/L — ABNORMAL HIGH (ref 3.5–5.1)
Sodium: 141 mmol/L (ref 135–145)

## 2022-11-24 LAB — PHOSPHORUS: Phosphorus: 3.4 mg/dL (ref 2.5–4.6)

## 2022-11-24 LAB — MAGNESIUM: Magnesium: 1.9 mg/dL (ref 1.7–2.4)

## 2022-11-24 MED ORDER — SODIUM ZIRCONIUM CYCLOSILICATE 10 G PO PACK
10.0000 g | PACK | Freq: Once | ORAL | Status: AC
  Administered 2022-11-24: 10 g via ORAL
  Filled 2022-11-24: qty 1

## 2022-11-24 MED ORDER — DEXTROSE 50 % IV SOLN
1.0000 | Freq: Once | INTRAVENOUS | Status: AC
  Administered 2022-11-24: 50 mL via INTRAVENOUS
  Filled 2022-11-24: qty 50

## 2022-11-24 MED ORDER — INSULIN ASPART 100 UNIT/ML IJ SOLN
2.0000 [IU] | INTRAMUSCULAR | Status: DC
  Administered 2022-11-25 – 2022-11-26 (×3): 2 [IU] via SUBCUTANEOUS
  Filled 2022-11-24 (×3): qty 1

## 2022-11-24 MED ORDER — METHOCARBAMOL 500 MG PO TABS
500.0000 mg | ORAL_TABLET | Freq: Four times a day (QID) | ORAL | Status: DC | PRN
  Administered 2022-11-24 – 2022-11-25 (×3): 500 mg via ORAL
  Filled 2022-11-24 (×3): qty 1

## 2022-11-24 MED ORDER — INSULIN ASPART 100 UNIT/ML IJ SOLN
0.0000 [IU] | Freq: Every day | INTRAMUSCULAR | Status: DC
  Administered 2022-11-24: 4 [IU] via SUBCUTANEOUS
  Filled 2022-11-24: qty 1

## 2022-11-24 MED ORDER — INSULIN ASPART 100 UNIT/ML IJ SOLN
0.0000 [IU] | Freq: Three times a day (TID) | INTRAMUSCULAR | Status: DC
  Administered 2022-11-24 – 2022-11-25 (×2): 3 [IU] via SUBCUTANEOUS
  Filled 2022-11-24 (×2): qty 1

## 2022-11-24 NOTE — TOC Initial Note (Signed)
Transition of Care [facility]) - Initial/Assessment Note    Patient Details  Name: Ashley Camacho MRN: [id number] Date of Birth: 1990/10/22  Transition of Care [facility]) CM/SW Contact:    Margarito Liner, LCSW Phone Number: 11/24/2022, 11:08 AM  Clinical Narrative:  Readmission prevention screen complete. CSW met with patient. No supports at bedside. CSW introduced role and explained that discharge planning would be discussed. Patient does not have a PCP. She confirmed issues with getting in touch with Medicaid to have her PCP changed to [facility]. CSW gave her phone number that TOC obtained last admission to coordinate PCP transfer and to set up transportation. [facility] [address]. No issues obtaining medications. Patient lives home with her children, their father, and his brother. No home health prior to admission. She does not use DME to get around at home but does get tube feeds through [employer]. No further concerns. CSW encouraged patient to contact CSW as needed. CSW will continue to follow patient for support and facilitate return home once stable. Her mother or her children's father will transport her home at discharge.                Expected Discharge Plan: Home/Self Care Barriers to Discharge: Continued Medical Work up   Patient Goals and CMS Choice            Expected Discharge Plan and Services     Post Acute Care Choice: NA Living arrangements for the past 2 months: Single Family Home                                      Prior Living Arrangements/Services Living arrangements for the past 2 months: Single Family Home Lives with:: Minor Children, Other (Comment) (Children's father and his brother.) Patient language and need for interpreter reviewed:: Yes Do you feel safe going back to the place where you live?: Yes      Need for Family Participation in Patient Care: Yes (Comment) Care giver support system in place?: Yes (comment) Current home  services: DME Criminal Activity/Legal Involvement Pertinent to Current Situation/Hospitalization: No - Comment as needed  Activities of Daily Living Home Assistive Devices/Equipment: None ADL Screening (condition at time of admission) Patient's cognitive ability adequate to safely complete daily activities?: Yes Is the patient deaf or have difficulty hearing?: No Does the patient have difficulty seeing, even when wearing glasses/contacts?: Yes Does the patient have difficulty concentrating, remembering, or making decisions?: No Patient able to express need for assistance with ADLs?: Yes Does the patient have difficulty dressing or bathing?: No Independently performs ADLs?: Yes (appropriate for developmental age) Does the patient have difficulty walking or climbing stairs?: No Weakness of Legs: None Weakness of Arms/Hands: None  Permission Sought/Granted                  Emotional Assessment Appearance:: Appears stated age Attitude/Demeanor/Rapport: Engaged, Gracious Affect (typically observed): Accepting, Appropriate, Calm, Pleasant Orientation: : Oriented to Self, Oriented to Place, Oriented to  Time, Oriented to Situation Alcohol / Substance Use: Not Applicable Psych Involvement: No (comment)  Admission diagnosis:  AKI (acute kidney injury) (HCC) [N17.9] Jejunostomy tube fell out [T85.528A] Hypoglycemia [E16.2] Patient Active Problem List   Diagnosis Date Noted   AKI (acute kidney injury) (HCC) 11/22/2022   Dislodged jejunostomy tube 11/22/2022   Type 1 diabetes mellitus without complications (HCC) 11/22/2022   DVT (deep  venous thrombosis) (HCC) 11/20/2022   Failure to thrive in adult 11/13/2022   Hyperglycemia 11/13/2022   Intractable nausea and vomiting 11/13/2022   Normocytic anemia 11/12/2022   Severe recurrent major depression (HCC) 11/03/2022   CKD stage 3a, GFR 45-59 ml/min (HCC) 11/01/2022   Elevated serum creatinine 11/01/2022   Abdominal pain 11/01/2022    Cystitis 11/01/2022   Acute exacerbation of congestive heart failure (HCC) 08/23/2022   Diabetic ketoacidosis (HCC) 08/23/2022   Hypothermia 07/27/2022   Stage 3b chronic kidney disease (HCC) 06/29/2022   Hypovolemic shock (HCC) 06/28/2022   Jejunostomy tube present (HCC) 06/28/2022   Insomnia 06/20/2022   Hypophosphatemia 06/13/2022   Severe malnutrition (HCC) 06/13/2022   Hypotension 06/10/2022   Pseudohyponatremia 06/10/2022   Hypomagnesemia 06/10/2022   Adrenal insufficiency (HCC) 06/09/2022   Acute gastroenteritis 02/28/2022   Hypokalemia 02/28/2022   Hypocalcemia 02/28/2022   Metabolic acidosis 02/28/2022   Essential hypertension 02/28/2022   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   Nausea & vomiting 02/28/2022   High anion gap metabolic acidosis 01/02/2022   UTI (urinary tract infection) 11/03/2021   Hypoglycemia due to type 1 diabetes mellitus (HCC) 11/02/2021   Thrombocytopenia (HCC) 11/02/2021   Problem related to social environment 09/24/2021   Rhinovirus 09/22/2021   Acute on chronic HFrEF (heart failure with reduced ejection fraction) (HCC) 09/07/2021   Pneumonia due to human metapneumovirus 08/31/2021   Gastro-esophageal reflux disease with esophagitis 08/31/2021   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   Congestive heart failure (HCC) 08/31/2021   Hypoglycemia due to insulin 06/14/2021   DKA (diabetic ketoacidosis) (HCC) 06/14/2021   CKD stage 3 due to type 1 diabetes mellitus (HCC) 06/14/2021   Transaminitis 06/14/2021   Hypoglycemia associated with diabetes (HCC) 06/03/2021   DKA, type 1 (HCC) 05/20/2021   Major depressive disorder, recurrent episode, moderate (HCC) 05/08/2021   Right arm pain 05/07/2021   Rash of hand 05/07/2021   Hyperkalemia 05/06/2021   History of adrenal insufficiency 05/06/2021   CAP (community acquired pneumonia) 05/05/2021   Hypoglycemia 04/27/2021   Combined systolic and diastolic heart failure (HCC) 03/11/2021    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) 03/11/2021   Protein-calorie malnutrition, severe (HCC) 03/11/2021   Adrenal insufficiency (Addison's disease) (HCC) 03/10/2021   Postoperative hematoma involving genitourinary system following genitourinary procedure    S/P cesarean section 02/17/2021   Acute esophagitis    Leg edema    History of preterm delivery, currently pregnant    Pre-existing type 1 diabetes mellitus during pregnancy in third trimester    Anemia of chronic disease    Type 1 diabetes mellitus affecting pregnancy in second trimester, antepartum    HFrEF (heart failure with reduced ejection fraction) (HCC)    Acute deep vein thrombosis (DVT) of brachial vein of right upper extremity (HCC) 12/17/2020   Anxiety 12/17/2020   Diabetic retinopathy (HCC) 12/17/2020   Chronic combined systolic and diastolic CHF (congestive heart failure) (HCC)    Hyperemesis 12/13/2020   Type 1 diabetes mellitus with hypoglycemia without coma (HCC) 12/13/2020   Sunburn 12/13/2020   Hypothyroidism 12/10/2020   Malnutrition (HCC) 12/10/2020   Vitreous hemorrhage of right eye (HCC) 12/10/2020   Pleural effusion associated with pulmonary infection 11/25/2020   Pneumonia affecting pregnancy 11/24/2020   Diabetes mellitus affecting pregnancy, second trimester 11/24/2020   Edema 11/24/2020   Decreased urine output 11/24/2020   Shortness of breath    Zinc deficiency    SOB (shortness of breath)    Social problem 11/21/2020  Nausea and vomiting during pregnancy prior to [redacted] weeks gestation 11/21/2020   Low serum vitamin B12    Delivery with history of C-section 11/20/2020   Absolute anemia    Acute kidney injury superimposed on chronic kidney disease (HCC)    Nausea and vomiting during pregnancy 10/23/2020   History of premature delivery, currently pregnant, second trimester 10/13/2020   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 [place] [address] Phone: [phone] Fax: [phone]  [place] [facility] [address] [place] [address] Phone: [phone] Fax: [phone]     Social Determinants of Health (SDOH) Social History: SDOH Screenings   Food Insecurity: No Food Insecurity (11/22/2022)  Housing: Low Risk  (11/22/2022)  Transportation Needs: No Transportation Needs (11/22/2022)  Utilities: Not At Risk (11/22/2022)  Depression (PHQ2-9): Low Risk  (11/11/2020)  Tobacco Use: Low Risk  (11/22/2022)   SDOH Interventions:     Readmission Risk Interventions    11/24/2022   11:04 AM 11/03/2021   10:37 AM 06/16/2021   12:30 PM  Readmission Risk Prevention Plan  Transportation Screening Complete Complete Complete  Medication Review Oceanographer) Complete Complete Complete  PCP or Specialist appointment within 3-5 days of discharge Complete Complete Complete  HRI or Home Care Consult  Not Complete   HRI or Home Care Consult Pt Refusal Comments  NA   SW Recovery Care/Counseling Consult Complete Not Complete Complete  SW Consult Not Complete Comments  NA   Palliative Care Screening Not Applicable Not Applicable Not Applicable  Skilled Nursing Facility Not Applicable Not Applicable Not Applicable

## 2022-11-24 NOTE — Assessment & Plan Note (Signed)
Due to chronic illness, severe gastroparesis. --Appreciate RD recommendations --on nocturnal tube feeds

## 2022-11-24 NOTE — Plan of Care (Signed)
  Problem: Nutrition: Goal: Adequate nutrition will be maintained Outcome: Progressing   Problem: Pain Managment: Goal: General experience of comfort will improve Outcome: Progressing   

## 2022-11-24 NOTE — Inpatient Diabetes Management (Addendum)
Inpatient Diabetes Program Recommendations  AACE/ADA: New Consensus Statement on Inpatient Glycemic Control (2015)  Target Ranges:  Prepandial:   less than 140 mg/dL      Peak postprandial:   less than 180 mg/dL (1-2 hours)      Critically ill patients:  140 - 180 mg/dL   Lab Results  Component Value Date   GLUCAP 308 (H) 11/24/2022   HGBA1C 9.4 (H) 11/02/2022    Review of Glycemic Control  Latest Reference Range & Units 11/23/22 08:23 11/23/22 08:48 11/23/22 11:08 11/23/22 11:45 11/23/22 15:59 11/23/22 20:14 11/23/22 21:34 11/23/22 23:46 11/24/22 03:59 11/24/22 07:19  Glucose-Capillary 70 - 99 mg/dL 22 (LL) 161 (H) 39 (LL) 188 (H) 323 (H) 439 (H) 427 (H) 115 (H) 139 (H) 308 (H)  (LL): Data is critically low (H): Data is abnormally high  Current Orders: Semglee 3 units BID                            Novolog 0-20 units TID AC + HS                            Novolog 2 units TID with meals       Nocturnal tube feeds (Glucerna 60cc/hr to run from 6pm to 8am)    CBG was 308 mg/dL this morning   Please consider:   1. Reduce the Novolog SSI to 0-9 unit scale tid and 0-5 qhs   2. Continue the Novolog 2 units TID with meals (solid meals)   3. Start Novolog Tube Feed Coverage overnight: Novolog 2 units to be given at 12am and 4am   4. Increase frequency of CBG checks to Q4 hours  Will continue to follow while inpatient.  Thank you, Dulce Sellar, MSN, CDCES Diabetes Coordinator Inpatient Diabetes Program [phone] (team pager from 8a-5p)

## 2022-11-24 NOTE — Progress Notes (Addendum)
Progress Note   Patient: Ashley Camacho WGN:[id number] DOB: 01/27/1991 DOA: 11/22/2022     1 DOS: the patient was seen and examined on 11/24/2022   Brief hospital course: HPI on admission 11/22/22 by Dr. Arville Care:  "ZAYLEN BLACKWATER is a 32 y.o. female with medical history significant for type 1 diabetes mellitus with gastroparesis s/p G-tube placement, CHF, hypertension, and adrenal insufficiency, who presented to the emergency room with acute onset of falling off J-tube.  She denies any nausea or vomiting however has been having left upper quadrant abdominal pain "   ED evaluation -- hypoglycemic with glucose 58, Cr 2.14 (up from 1.25 on 6/24), Hbg 9.3 (up from prior 8.5).  She was treated with D50 and IV fluids in the ER.  ED provider attempted to replace the J tube but was unsuccessful.   Admitted to medicine service with surgery consulted for replacement of the J tube.    7/1 -- IR was able to replace J tube.  Nocturnal tube feeds were initiated overnight.  7/2 -- pt developed nausea/vomting overnight, tube feeds were stopped.  AM glucose was 28 >> 22. Treated with D50 and started on D5-NS fluids.  Later in the day, pt was tolerating food again.  7/3 -- pt given 15 units Novolog for sugar 308 after cross coverage provider increased sliding scale from very sensitive to moderate overnight.  Given D50 to prevent hypoglycemic crisis and she still dropped to 58 even with D50.  Pt stable for d/c but new J tube is different than prior, attempting to get the accessory parts, adapter etc she needs for home.    Assessment and Plan: * Dislodged jejunostomy tube IR was able to replace the tube 7/1. RD consulted for nocturnal tube feeds. Pt does nocturnal feeds at home, recently at 60 cc/hr bc she's been tolerating PO meals recently. Developed N/V with tube feeds at 80 /hr last night. --will lower rate of feeds to 60 cc/hr  Hypoglycemia Recurrent. Pt's glucose is extremely labile.   --Hypoglycemia  protocol --On D5-NS fluids - continue until taking PO intake well  AKI (acute kidney injury) (HCC) Cr on admission 2.14, up from prior 1.25.   Improving with hydration. --Continue IV fluids --Monitor BMP  Hypothyroidism Continue Synthroid  Type 1 diabetes mellitus without complications (HCC) Sliding scale Novolog, very sensitive 2 units Novolog TID WC Semglee 3 units BID Appreciate diabetes coordinator input  Protein-calorie malnutrition, severe (HCC) Due to chronic illness, severe gastroparesis. --Appreciate RD recommendations --on nocturnal tube feeds  Adrenal insufficiency (Addison's disease) (HCC) Continue Cortef  Essential hypertension Continue Norvasc        Subjective: Pt awake resting in bed today.  Feeling well overall.  She discusses her experience during J tube replacement, was not medicated for pain or to relax her, states it was extremely painful and traumatic, states she was screaming "Stop".  Not sure she will be willing to undergo any procedure like that in the future after that.  Toelrating diet today so far. But was having nausea earlier.  Stomach cramping, ordered Robaxin which she takes at home for it, says it helps.  Physical Exam: Vitals:   11/24/22 0357 11/24/22 0500 11/24/22 0718 11/24/22 1521  BP: 110/60  (!) 148/87 113/73  Pulse: 87  93 96  Resp: 16  16 16   Temp: 98.4 F (36.9 C)  98.5 F (36.9 C) 98.2 F (36.8 C)  TempSrc:      SpO2: 100%  100% 100%  Weight:  49.3 kg    Height:       General exam: awake, alert, no acute distress HEENT: moist mucus membranes, hearing grossly normal  Respiratory system: CTAB, no wheezes, rales or rhonchi, normal respiratory effort. Cardiovascular system: normal S1/S2, RRR, no JVD, murmurs, rubs, gallops, no pedal edema.   Gastrointestinal system: soft, NT, ND, no HSM felt, +bowel sounds. Central nervous system: A&O x 4. no gross focal neurologic deficits, normal speech Extremities: moves all , no edema,  normal tone Skin: dry, intact, normal temperature, normal color, No rashes, lesions or ulcers Psychiatry: normal mood, congruent affect, judgement and insight appear normal   Data Reviewed:  Notable labs -- K 5.5, BUN 38, Cr 1.71, ca 8.0, Hbg 8.1   CBG's 115 >> 139 >> 308 >> 62 >> 58 >> 127  Family Communication: None  Disposition: Status is: Inpatient Remains inpatient appropriate because:  --New J-tube does not have a clamp, attempting to obtain clamp and any other new/different parts to be compatible with her pump at home --Hypoglycemic episodes warrant close monitoring    Planned Discharge Destination: Home    Time spent: 46 minutes  Author: Pennie Banter, DO 11/24/2022 4:46 PM  For on call review [email or web address].

## 2022-11-24 NOTE — Progress Notes (Addendum)
Hypoglycemic Event  CBG: 62  Treatment: D50 50 mL (25 gm)  Symptoms: None  Follow-up CBG: Time: 1237 CBG Result: 58  CBG: 58  Treatment: 240 oz orange juice  Symptoms: None  Follow-up CBG: Time 1347   CBG Result: 127  Possible Reasons for Event: Medication Regiment  Comments/MD notified: Dr. Denton Lank updated, new orders entered.    Madie Reno, RN

## 2022-11-25 DIAGNOSIS — T85528A Displacement of other gastrointestinal prosthetic devices, implants and grafts, initial encounter: Secondary | ICD-10-CM | POA: Diagnosis not present

## 2022-11-25 LAB — GLUCOSE, CAPILLARY
Glucose-Capillary: 105 mg/dL — ABNORMAL HIGH (ref 70–99)
Glucose-Capillary: 203 mg/dL — ABNORMAL HIGH (ref 70–99)
Glucose-Capillary: 226 mg/dL — ABNORMAL HIGH (ref 70–99)
Glucose-Capillary: 280 mg/dL — ABNORMAL HIGH (ref 70–99)
Glucose-Capillary: 346 mg/dL — ABNORMAL HIGH (ref 70–99)
Glucose-Capillary: 37 mg/dL — CL (ref 70–99)
Glucose-Capillary: 413 mg/dL — ABNORMAL HIGH (ref 70–99)
Glucose-Capillary: 429 mg/dL — ABNORMAL HIGH (ref 70–99)

## 2022-11-25 LAB — CBC
HCT: 24.3 % — ABNORMAL LOW (ref 36.0–46.0)
Hemoglobin: 7.9 g/dL — ABNORMAL LOW (ref 12.0–15.0)
MCH: 29.9 pg (ref 26.0–34.0)
MCHC: 32.5 g/dL (ref 30.0–36.0)
MCV: 92 fL (ref 80.0–100.0)
Platelets: 125 10*3/uL — ABNORMAL LOW (ref 150–400)
RBC: 2.64 MIL/uL — ABNORMAL LOW (ref 3.87–5.11)
RDW: 13.9 % (ref 11.5–15.5)
WBC: 5 10*3/uL (ref 4.0–10.5)
nRBC: 0 % (ref 0.0–0.2)

## 2022-11-25 LAB — BASIC METABOLIC PANEL
Anion gap: 4 — ABNORMAL LOW (ref 5–15)
BUN: 50 mg/dL — ABNORMAL HIGH (ref 6–20)
CO2: 26 mmol/L (ref 22–32)
Calcium: 7.9 mg/dL — ABNORMAL LOW (ref 8.9–10.3)
Chloride: 107 mmol/L (ref 98–111)
Creatinine, Ser: 1.59 mg/dL — ABNORMAL HIGH (ref 0.44–1.00)
GFR, Estimated: 44 mL/min — ABNORMAL LOW (ref >60)
Glucose, Bld: 206 mg/dL — ABNORMAL HIGH (ref 70–99)
Potassium: 5.4 mmol/L — ABNORMAL HIGH (ref 3.5–5.1)
Sodium: 137 mmol/L (ref 135–145)

## 2022-11-25 MED ORDER — INSULIN ASPART 100 UNIT/ML IJ SOLN
2.0000 [IU] | Freq: Once | INTRAMUSCULAR | Status: AC
  Administered 2022-11-25: 2 [IU] via SUBCUTANEOUS
  Filled 2022-11-25: qty 1

## 2022-11-25 MED ORDER — GLUCOSE 40 % PO GEL
2.0000 | ORAL | Status: AC
  Administered 2022-11-25: 62 g via ORAL
  Filled 2022-11-25: qty 2.42

## 2022-11-25 MED ORDER — SODIUM CHLORIDE 0.9% FLUSH: 10.0000 mL | INTRAVENOUS | Status: DC | PRN

## 2022-11-25 MED ORDER — SODIUM CHLORIDE 0.9% FLUSH
10.0000 mL | Freq: Two times a day (BID) | INTRAVENOUS | Status: DC
  Administered 2022-11-25 – 2022-11-28 (×7): 10 mL

## 2022-11-25 NOTE — Progress Notes (Signed)
Progress Note   Patient: Ashley Camacho:[id number] DOB: 05-15-1991 DOA: 11/22/2022     2 DOS: the patient was seen and examined on 11/25/2022   Brief hospital course:  HPI on admission 11/22/22 by Dr. Arville Care:  "Ashley Camacho is a 32 y.o. female with medical history significant for type 1 diabetes mellitus with gastroparesis s/p G-tube placement, CHF, hypertension, and adrenal insufficiency, who presented to the emergency room with acute onset of falling off J-tube.  She denies any nausea or vomiting however has been having left upper quadrant abdominal pain "    ED evaluation -- hypoglycemic with glucose 58, Cr 2.14 (up from 1.25 on 6/24), Hbg 9.3 (up from prior 8.5).  She was treated with D50 and IV fluids in the ER.  ED provider attempted to replace the J tube but was unsuccessful.    Admitted to medicine service with surgery consulted for replacement of the J tube.     7/1 -- IR was able to replace J tube.  Nocturnal tube feeds were initiated overnight.   7/2 -- pt developed nausea/vomting overnight, tube feeds were stopped.  AM glucose was 28 >> 22. Treated with D50 and started on D5-NS fluids.  Later in the day, pt was tolerating food again.   7/3 -- pt given 15 units Novolog for sugar 308 after cross coverage provider increased sliding scale from very sensitive to moderate overnight.  Given D50 to prevent hypoglycemic crisis and she still dropped to 58 even with D50.  Pt stable for d/c but new J tube is different than prior, attempting to get the accessory parts, adapter etc she needs for home.  7/4: Had another episode of hypoglycemia with blood sugar dropping to 37 after receiving 5 units of NovoLog with breakfast.    Assessment and Plan:   * Dislodged jejunostomy tube IR was able to replace the tube 7/1. RD consulted for nocturnal tube feeds. Pt does nocturnal feeds at home, recently at 60 cc/hr bc she's been tolerating PO meals recently. Developed N/V with tube feeds at 80  /h -- Stable on tube feeds at 60 cc/hr    Hypoglycemia Recurrent. Pt's glucose is extremely labile.   --Hypoglycemia protocol -- Discontinue sliding scale coverage and only leave the patient on scheduled insulin    AKI (acute kidney injury) (HCC) Cr on admission 2.14, up from prior 1.25.   Improving with hydration. --Monitor closely --Monitor BMP   Hypothyroidism Continue Synthroid   Type 1 diabetes mellitus without complications (HCC) Continue scheduled insulin.  Discontinued sliding scale coverage Patient currently on 2 units Novolog TID WC and Semglee 3 units BID Appreciate diabetes coordinator input   Protein-calorie malnutrition, severe (HCC) Due to chronic illness, severe gastroparesis. --Appreciate RD recommendations --on nocturnal tube feeds   Adrenal insufficiency (Addison's disease) (HCC) Continue Cortef   Essential hypertension Continue Norvasc       Subjective: Patient is seen and examined at the bedside.  Has no new complaints.  Physical Exam: Vitals:   11/24/22 1521 11/24/22 2008 11/25/22 0312 11/25/22 0325  BP: 113/73 126/71 131/71   Pulse: 96 93 91   Resp: 16 20 20    Temp: 98.2 F (36.8 C) 98 F (36.7 C) 98.4 F (36.9 C)   TempSrc:  Oral Oral   SpO2: 100% 100% 99%   Weight:    49.3 kg  Height:       General exam: awake, alert, no acute distress HEENT: moist mucus membranes, hearing grossly normal  Respiratory  system: CTAB, no wheezes, rales or rhonchi, normal respiratory effort. Cardiovascular system: normal S1/S2, RRR, no JVD, murmurs, rubs, gallops, no pedal edema.   Gastrointestinal system: soft, NT, ND, no HSM felt, +bowel sounds. Central nervous system: A&O x 4. no gross focal neurologic deficits, normal speech Extremities: moves all , no edema, normal tone Skin: dry, intact, normal temperature, normal color, No rashes, lesions or ulcers Psychiatry: normal mood, congruent affect, judgement and insight appear normal     Data  Reviewed: Labs reviewed.  BUN 15, creatinine 1.59 There are no new results to review at this time.  Family Communication: Plan of care discussed with patient.  Disposition: Status is: Inpatient Remains inpatient appropriate because: For discharge in a.m.  Planned Discharge Destination: Home    Time spent: 35 minutes  Author: Lucile Shutters, MD 11/25/2022 3:02 PM  For on call review [email or web address].

## 2022-11-25 NOTE — Plan of Care (Signed)
Pt alert and oriented x4. Up alib. Feeding running til 0800. Changes made yesterday to BS and inslin times. BS was 130's at midnight. Pt refused insulin 2 units. Pt tolerating feeding. No n/v noted. Vitals stable  Problem: Education: Goal: Knowledge of General Education information will improve Description: Including pain rating scale, medication(s)/side effects and non-pharmacologic comfort measures Outcome: Progressing   Problem: Health Behavior/Discharge Planning: Goal: Ability to manage health-related needs will improve Outcome: Progressing   Problem: Clinical Measurements: Goal: Ability to maintain clinical measurements within normal limits will improve Outcome: Progressing Goal: Will remain free from infection Outcome: Progressing Goal: Diagnostic test results will improve Outcome: Progressing Goal: Respiratory complications will improve Outcome: Progressing Goal: Cardiovascular complication will be avoided Outcome: Progressing   Problem: Activity: Goal: Risk for activity intolerance will decrease Outcome: Progressing   Problem: Nutrition: Goal: Adequate nutrition will be maintained Outcome: Progressing   Problem: Coping: Goal: Level of anxiety will decrease Outcome: Progressing   Problem: Elimination: Goal: Will not experience complications related to bowel motility Outcome: Progressing Goal: Will not experience complications related to urinary retention Outcome: Progressing   Problem: Pain Managment: Goal: General experience of comfort will improve Outcome: Progressing   Problem: Safety: Goal: Ability to remain free from injury will improve Outcome: Progressing   Problem: Skin Integrity: Goal: Risk for impaired skin integrity will decrease Outcome: Progressing   Problem: Education: Goal: Ability to describe self-care measures that may prevent or decrease complications (Diabetes Survival Skills Education) will improve Outcome: Progressing Goal:  Individualized Educational Video(s) Outcome: Progressing   Problem: Coping: Goal: Ability to adjust to condition or change in health will improve Outcome: Progressing   Problem: Fluid Volume: Goal: Ability to maintain a balanced intake and output will improve Outcome: Progressing   Problem: Health Behavior/Discharge Planning: Goal: Ability to identify and utilize available resources and services will improve Outcome: Progressing Goal: Ability to manage health-related needs will improve Outcome: Progressing   Problem: Metabolic: Goal: Ability to maintain appropriate glucose levels will improve Outcome: Progressing   Problem: Nutritional: Goal: Maintenance of adequate nutrition will improve Outcome: Progressing Goal: Progress toward achieving an optimal weight will improve Outcome: Progressing   Problem: Skin Integrity: Goal: Risk for impaired skin integrity will decrease Outcome: Progressing   Problem: Tissue Perfusion: Goal: Adequacy of tissue perfusion will improve Outcome: Progressing

## 2022-11-25 NOTE — Progress Notes (Signed)

## 2022-11-26 DIAGNOSIS — E10641 Type 1 diabetes mellitus with hypoglycemia with coma: Secondary | ICD-10-CM

## 2022-11-26 DIAGNOSIS — T85528A Displacement of other gastrointestinal prosthetic devices, implants and grafts, initial encounter: Secondary | ICD-10-CM | POA: Diagnosis not present

## 2022-11-26 LAB — GLUCOSE, CAPILLARY
Glucose-Capillary: 139 mg/dL — ABNORMAL HIGH (ref 70–99)
Glucose-Capillary: 177 mg/dL — ABNORMAL HIGH (ref 70–99)
Glucose-Capillary: 184 mg/dL — ABNORMAL HIGH (ref 70–99)
Glucose-Capillary: 343 mg/dL — ABNORMAL HIGH (ref 70–99)
Glucose-Capillary: 397 mg/dL — ABNORMAL HIGH (ref 70–99)
Glucose-Capillary: 428 mg/dL — ABNORMAL HIGH (ref 70–99)
Glucose-Capillary: 465 mg/dL — ABNORMAL HIGH (ref 70–99)
Glucose-Capillary: 50 mg/dL — ABNORMAL LOW (ref 70–99)
Glucose-Capillary: 502 mg/dL (ref 70–99)
Glucose-Capillary: 520 mg/dL (ref 70–99)
Glucose-Capillary: 537 mg/dL (ref 70–99)

## 2022-11-26 LAB — BASIC METABOLIC PANEL
Anion gap: 7 (ref 5–15)
BUN: 55 mg/dL — ABNORMAL HIGH (ref 6–20)
CO2: 26 mmol/L (ref 22–32)
Calcium: 8.8 mg/dL — ABNORMAL LOW (ref 8.9–10.3)
Chloride: 106 mmol/L (ref 98–111)
Creatinine, Ser: 1.38 mg/dL — ABNORMAL HIGH (ref 0.44–1.00)
GFR, Estimated: 52 mL/min — ABNORMAL LOW (ref >60)
Glucose, Bld: 177 mg/dL — ABNORMAL HIGH (ref 70–99)
Potassium: 5.1 mmol/L (ref 3.5–5.1)
Sodium: 139 mmol/L (ref 135–145)

## 2022-11-26 MED ORDER — INSULIN ASPART 100 UNIT/ML IJ SOLN
3.0000 [IU] | Freq: Once | INTRAMUSCULAR | Status: AC
  Administered 2022-11-26: 3 [IU] via SUBCUTANEOUS

## 2022-11-26 NOTE — Inpatient Diabetes Management (Addendum)
Inpatient Diabetes Program Recommendations  AACE/ADA: New Consensus Statement on Inpatient Glycemic Control (2015)  Target Ranges:  Prepandial:   less than 140 mg/dL      Peak postprandial:   less than 180 mg/dL (1-2 hours)      Critically ill patients:  140 - 180 mg/dL    Latest Reference Range & Units 11/24/22 23:45 11/25/22 03:11 11/25/22 08:09 11/25/22 11:34 11/25/22 12:20 11/25/22 15:48 11/25/22 18:51 11/25/22 20:01  Glucose-Capillary 70 - 99 mg/dL 161 (H) 096 (H)  2 units Novolog  203 (H)  5 units Novolog   3 units Semglee @1012   37 (LL) 105 (H) 226 (H)  2 units Novolog  413 (H) 429 (H)  2 units Novolog    3 units Semglee @2003    (LL): Data is critically low (H): Data is abnormally high  Latest Reference Range & Units 11/25/22 23:43 11/26/22 04:08 11/26/22 06:44 11/26/22 07:02  Glucose-Capillary 70 - 99 mg/dL 045 (H)  2 units Novolog  184 (H)  2 units Novolog  50 (L) 177 (H)  (H): Data is abnormally high (L): Data is abnormally low  History: Type 1 diabetes   Home DM Meds: Basaglar 5 units BID                             Novolog 0-6 units QID   Current Orders: Semglee 3 units BID                            Novolog 2 units BID (12am, 4am for Nocturnal Tueb Feeds)                            Novolog 2 units TID with meals     MD- Note severe lability of CBGs.  Likely rising overnight due to Nocturnal tube feeds--Is getting small dose of tube feed coverage overnight.  Note Novolog SSI (0-9 unit scale) stopped yest AM  Please consider:  1. Reduce Novolog Meal Coverage to 1 units TID with meals (for solid food during the day)  2. Continue Novolog 2 units BID (12am, 4am for tube feed coverage overnight)  3. Start Very low dose Novolog SSI: Novolog 0-6 units (Very Sensitive scale) Q4 hours      --Will follow patient during hospitalization--  Ambrose Finland RN, MSN, CDCES Diabetes Coordinator Inpatient Glycemic Control Team Team Pager:  [phone] (8a-5p)

## 2022-11-26 NOTE — Progress Notes (Signed)
Progress Note   Patient: Ashley Camacho ZOX:[id number] DOB: 04-17-1991 DOA: 11/22/2022     3 DOS: the patient was seen and examined on 11/26/2022   Brief hospital course: HPI on admission 11/22/22 by Dr. Arville Care:  "CAEDENCE WALBORN is a 32 y.o. female with medical history significant for type 1 diabetes mellitus with gastroparesis s/p G-tube placement, CHF, hypertension, and adrenal insufficiency, who presented to the emergency room with acute onset of falling off J-tube.  She denies any nausea or vomiting however has been having left upper quadrant abdominal pain "    ED evaluation -- hypoglycemic with glucose 58, Cr 2.14 (up from 1.25 on 6/24), Hbg 9.3 (up from prior 8.5).  She was treated with D50 and IV fluids in the ER.  ED provider attempted to replace the J tube but was unsuccessful.    Admitted to medicine service with surgery consulted for replacement of the J tube.     7/1 -- IR was able to replace J tube.  Nocturnal tube feeds were initiated overnight.   7/2 -- pt developed nausea/vomting overnight, tube feeds were stopped.  AM glucose was 28 >> 22. Treated with D50 and started on D5-NS fluids.  Later in the day, pt was tolerating food again.   7/3 -- pt given 15 units Novolog for sugar 308 after cross coverage provider increased sliding scale from very sensitive to moderate overnight.  Given D50 to prevent hypoglycemic crisis and she still dropped to 58 even with D50.  Pt stable for d/c but new J tube is different than prior, attempting to get the accessory parts, adapter etc she needs for home.   7/4: Had another episode of hypoglycemia with blood sugar dropping to 37 after receiving 5 units of NovoLog with breakfast.   7/5: Had an episode of hypoglycemia this morning and required an amp of D50     Assessment and Plan:  * Dislodged jejunostomy tube IR was able to replace the tube 7/1. RD consulted for nocturnal tube feeds. Pt does nocturnal feeds at home, recently at 60 cc/hr bc  she's been tolerating PO meals recently. Developed N/V with tube feeds at 80 /h -- Stable on tube feeds at 60 cc/hr     Hypoglycemia Recurrent. Pt's glucose is extremely labile.   --Hypoglycemia protocol -- Discontinue sliding scale coverage and only leave the patient on scheduled insulin with meals     AKI (acute kidney injury) (HCC) Cr on admission 2.14, up from prior 1.25.   Improving with hydration. --Monitor closely --Monitor BMP   Hypothyroidism Continue Synthroid   Type 1 diabetes mellitus without complications (HCC) Continue scheduled insulin.  Discontinued sliding scale coverage Patient currently on 2 units Novolog TID WC and Semglee 3 units BID Appreciate diabetes coordinator input   Protein-calorie malnutrition, severe (HCC) Due to chronic illness, severe gastroparesis. --Appreciate RD recommendations --on nocturnal tube feeds   Adrenal insufficiency (Addison's disease) (HCC) Continue Cortef   Essential hypertension Continue Norvasc          Subjective: Patient is seen and examined at the bedside.  Has no new complaints  Physical Exam: Vitals:   11/25/22 2000 11/26/22 0407 11/26/22 0437 11/26/22 0820  BP: 113/62 118/64  117/65  Pulse: 85 88  87  Resp: 20 20  17   Temp: 98 F (36.7 C) 98.1 F (36.7 C)  97.9 F (36.6 C)  TempSrc: Oral Oral    SpO2: 100% 100%  100%  Weight:   49.4 kg  Height:        General exam: awake, alert, no acute distress HEENT: moist mucus membranes, hearing grossly normal  Respiratory system: CTAB, no wheezes, rales or rhonchi, normal respiratory effort. Cardiovascular system: normal S1/S2, RRR, no JVD, murmurs, rubs, gallops, no pedal edema.   Gastrointestinal system: soft, NT, ND, no HSM felt, +bowel sounds. Central nervous system: A&O x 4. no gross focal neurologic deficits, normal speech Extremities: moves all , no edema, normal tone Skin: dry, intact, normal temperature, normal color, No rashes, lesions or  ulcers Psychiatry: normal mood, congruent affect, judgement and insight appear normal     Data Reviewed: Labs reviewed There are no new results to review at this time.  Family Communication: Plan of care discussed with patient in detail  Disposition: Status is: Inpatient Remains inpatient appropriate because: Optimize glycemic control  Planned Discharge Destination: Home    Time spent: 35 minutes  Author: Lucile Shutters, MD 11/26/2022 1:33 PM  For on call review [email or web address].

## 2022-11-27 DIAGNOSIS — T85528A Displacement of other gastrointestinal prosthetic devices, implants and grafts, initial encounter: Secondary | ICD-10-CM | POA: Diagnosis not present

## 2022-11-27 LAB — GLUCOSE, CAPILLARY
Glucose-Capillary: 424 mg/dL — ABNORMAL HIGH (ref 70–99)
Glucose-Capillary: 406 mg/dL — ABNORMAL HIGH (ref 70–99)
Glucose-Capillary: 468 mg/dL — ABNORMAL HIGH (ref 70–99)
Glucose-Capillary: 329 mg/dL — ABNORMAL HIGH (ref 70–99)
Glucose-Capillary: 181 mg/dL — ABNORMAL HIGH (ref 70–99)
Glucose-Capillary: 65 mg/dL — ABNORMAL LOW (ref 70–99)
Glucose-Capillary: 242 mg/dL — ABNORMAL HIGH (ref 70–99)
Glucose-Capillary: 326 mg/dL — ABNORMAL HIGH (ref 70–99)
Glucose-Capillary: 380 mg/dL — ABNORMAL HIGH (ref 70–99)
Glucose-Capillary: 330 mg/dL — ABNORMAL HIGH (ref 70–99)

## 2022-11-27 MED ORDER — INSULIN ASPART 100 UNIT/ML IJ SOLN
2.0000 [IU] | Freq: Three times a day (TID) | INTRAMUSCULAR | Status: DC
  Administered 2022-11-27 – 2022-11-28 (×3): 2 [IU] via SUBCUTANEOUS
  Filled 2022-11-27 (×3): qty 1

## 2022-11-27 MED ORDER — INSULIN ASPART 100 UNIT/ML IJ SOLN
4.0000 [IU] | Freq: Once | INTRAMUSCULAR | Status: AC
  Administered 2022-11-27: 4 [IU] via SUBCUTANEOUS
  Filled 2022-11-27: qty 1

## 2022-11-27 MED ORDER — DEXTROSE 50 % IV SOLN
INTRAVENOUS | Status: AC
  Filled 2022-11-27: qty 50

## 2022-11-27 MED ORDER — INSULIN ASPART 100 UNIT/ML IJ SOLN: 3.0000 [IU] | Freq: Three times a day (TID) | INTRAMUSCULAR | Status: DC

## 2022-11-27 MED ORDER — INSULIN ASPART 100 UNIT/ML IJ SOLN
4.0000 [IU] | Freq: Three times a day (TID) | INTRAMUSCULAR | Status: DC
  Administered 2022-11-27: 4 [IU] via SUBCUTANEOUS
  Filled 2022-11-27: qty 1

## 2022-11-27 MED ORDER — INSULIN GLARGINE-YFGN 100 UNIT/ML ~~LOC~~ SOLN
5.0000 [IU] | Freq: Two times a day (BID) | SUBCUTANEOUS | Status: DC
  Administered 2022-11-27 – 2022-11-28 (×3): 5 [IU] via SUBCUTANEOUS
  Filled 2022-11-27 (×4): qty 0.05

## 2022-11-27 NOTE — Progress Notes (Signed)
Progress Note   Patient: Ashley Camacho ZOX:[id number] DOB: 23-Jan-1991 DOA: 11/22/2022     4 DOS: the patient was seen and examined on 11/27/2022   Brief hospital course:  HPI on admission 11/22/22 by Dr. Arville Care:  "SHAYNIA ALEN is a 32 y.o. female with medical history significant for type 1 diabetes mellitus with gastroparesis s/p G-tube placement, CHF, hypertension, and adrenal insufficiency, who presented to the emergency room with acute onset of falling off J-tube.  She denies any nausea or vomiting however has been having left upper quadrant abdominal pain "    ED evaluation -- hypoglycemic with glucose 58, Cr 2.14 (up from 1.25 on 6/24), Hbg 9.3 (up from prior 8.5).  She was treated with D50 and IV fluids in the ER.  ED provider attempted to replace the J tube but was unsuccessful.    Admitted to medicine service with surgery consulted for replacement of the J tube.     7/1 -- IR was able to replace J tube.  Nocturnal tube feeds were initiated overnight.   7/2 -- pt developed nausea/vomting overnight, tube feeds were stopped.  AM glucose was 28 >> 22. Treated with D50 and started on D5-NS fluids.  Later in the day, pt was tolerating food again.   7/3 -- pt given 15 units Novolog for sugar 308 after cross coverage provider increased sliding scale from very sensitive to moderate overnight.  Given D50 to prevent hypoglycemic crisis and she still dropped to 58 even with D50.  Pt stable for d/c but new J tube is different than prior, attempting to get the accessory parts, adapter etc she needs for home.   7/4: Had another episode of hypoglycemia with blood sugar dropping to 37 after receiving 5 units of NovoLog with breakfast.       Assessment and Plan:  * Dislodged jejunostomy tube IR was able to replace the tube 7/1. RD consulted for nocturnal tube feeds. Pt does nocturnal feeds at home, recently at 60 cc/hr bc she's been tolerating PO meals recently. Developed N/V with tube feeds at  80 /h -- Stable on tube feeds at 60 cc/hr     Hypoglycemia Recurrent. Pt's glucose is extremely labile.   --Hypoglycemia protocol -- Discontinue sliding scale coverage and only leave the patient on scheduled insulin     AKI (acute kidney injury) (HCC) Cr on admission 2.14, up from prior 1.25.   Improving with hydration. --Monitor closely --Monitor BMP   Hypothyroidism Continue Synthroid   Type 1 diabetes mellitus without complications (HCC) Patient has very labile blood sugars and appears to be sensitive to insulin Continue scheduled insulin but increase to 4 units with meals.  Discontinued sliding scale coverage Increase Semglee 5 units BID Appreciate diabetes coordinator input   Protein-calorie malnutrition, severe (HCC) Due to chronic illness, severe gastroparesis. --Appreciate RD recommendations --on nocturnal tube feeds   Adrenal insufficiency (Addison's disease) (HCC) Continue Cortef   Essential hypertension Continue Norvasc               Subjective: Patient is seen and examined at the bedside.  No new complaints  Physical Exam: Vitals:   11/26/22 2136 11/27/22 0447 11/27/22 0458 11/27/22 0741  BP: 122/67 130/81  139/79  Pulse: 85 87  93  Resp: 20 17  18   Temp: 97.6 F (36.4 C) 97.7 F (36.5 C)  98 F (36.7 C)  TempSrc: Oral Oral  Oral  SpO2: 100% 100%  100%  Weight:   49.4 kg  Height:       General exam: awake, alert, no acute distress HEENT: moist mucus membranes, hearing grossly normal  Respiratory system: CTAB, no wheezes, rales or rhonchi, normal respiratory effort. Cardiovascular system: normal S1/S2, RRR, no JVD, murmurs, rubs, gallops, no pedal edema.   Gastrointestinal system: soft, NT, ND, no HSM felt, +bowel sounds. Central nervous system: A&O x 4. no gross focal neurologic deficits, normal speech Extremities: moves all , no edema, normal tone Skin: dry, intact, normal temperature, normal color, No rashes, lesions or  ulcers Psychiatry: normal mood, congruent affect, judgement and insight appear normal     Data Reviewed: Blood sugars have been elevated There are no new results to review at this time.  Family Communication: Plan of care discussed with patient  Disposition: Status is: Inpatient Remains inpatient appropriate because: Optimize glycemic control  Planned Discharge Destination: Home    Time spent: 35 minutes  Author: Lucile Shutters, MD 11/27/2022 12:21 PM  For on call review [email or web address].

## 2022-11-27 NOTE — Progress Notes (Signed)
Hypoglycemic Event 2:36pm CBG: 65  Treatment: D50 50 mL (25 gm)  Symptoms: None  Follow-up CBG: Time: 1515  CBG Result:181  Possible Reasons for Event: Unknown  Comments/MD notified: MD Agbata    Ashley Camacho

## 2022-11-27 NOTE — Progress Notes (Signed)
Evening glucose check is 465, received 5 units of novolog at 6pm. Spoke with NP Jon Billings, no insulin boluses for tonight due to reoccurring hypoglycemic events. Continue to monitor.

## 2022-11-28 DIAGNOSIS — T85528A Displacement of other gastrointestinal prosthetic devices, implants and grafts, initial encounter: Secondary | ICD-10-CM | POA: Diagnosis not present

## 2022-11-28 LAB — COPPER, SERUM: Copper: 122 ug/dL (ref 80–158)

## 2022-11-28 LAB — GLUCOSE, CAPILLARY
Glucose-Capillary: 114 mg/dL — ABNORMAL HIGH (ref 70–99)
Glucose-Capillary: 138 mg/dL — ABNORMAL HIGH (ref 70–99)
Glucose-Capillary: 164 mg/dL — ABNORMAL HIGH (ref 70–99)
Glucose-Capillary: 210 mg/dL — ABNORMAL HIGH (ref 70–99)
Glucose-Capillary: 304 mg/dL — ABNORMAL HIGH (ref 70–99)

## 2022-11-28 MED ORDER — METHOCARBAMOL 500 MG PO TABS: 500.0000 mg | ORAL_TABLET | Freq: Two times a day (BID) | ORAL | 0 refills | Status: AC | PRN

## 2022-11-28 MED ORDER — PROMETHAZINE HCL 25 MG PO TABS: 12.5000 mg | ORAL_TABLET | Freq: Four times a day (QID) | ORAL | 0 refills | Status: DC | PRN

## 2022-11-28 NOTE — Discharge Summary (Addendum)
Physician Discharge Summary   Patient: Ashley Camacho MRN: [id number] DOB: [birth year]  Admit date:     11/22/2022  Discharge date: 11/28/22  Discharge Physician: Ruqaya Strauss   PCP: Pcp, No   Recommendations at discharge:   Check and document blood sugars with meals Keep scheduled appointment with endocrinologist and PCP  Discharge Diagnoses: Principal Problem:   Dislodged jejunostomy tube Active Problems:   Hypoglycemia   AKI (acute kidney injury) (HCC)   Hypothyroidism   Essential hypertension   Adrenal insufficiency (Addison's disease) (HCC)   Protein-calorie malnutrition, severe (HCC)   Type 1 diabetes mellitus without complications (HCC)  Resolved Problems:   * No resolved hospital problems. *  Hospital Course: ANDRIEL LEONHART is a 32 y.o. female with medical history significant for type 1 diabetes mellitus with gastroparesis s/p G-tube placement, CHF, hypertension, and adrenal insufficiency, who presented to the emergency room with acute onset of falling off J-tube.  She denies any nausea or vomiting however has been having left upper quadrant abdominal pain.  No fever or chills.  No chest pain or palpitations.  No dysuria, oliguria or hematuria or flank pain.   ED Course: When the patient came to the ER, temperature was 100 and later 98.1, BP 94/63 and later 140/77 with heart rate of 109 and later 89.  Vital signs otherwise were within normal.  Labs revealed hyperglycemia of 58, BUN of 30 and creatinine of 2.14 compared to 61/1.25 on 11/11/2022, Some 8.6 and total protein 6.4 with CBC showing hemoglobin 9.3 and hematocrit 28.7 above previous levels. EKG as reviewed by me : EKG showed sinus rhythm with a rate of 93 with borderline low voltage QRS and Q waves anteroseptally. Imaging: None.   The patient was given an amp of D50 and 1 L bolus of IV lactated Ringer.  Dr. Everlene Farrier was notified and is aware about the patient.  Dr. Katrinka Blazing attempt replacing jejunostomy tube with  G-tube in the ER and it was not successful.  She will be admitted to an observation medical bed for further evaluation and management.    Assessment and Plan:   * Dislodged jejunostomy tube IR was able to replace the tube 7/1. RD consulted for nocturnal tube feeds. Pt does nocturnal feeds at home, 60cc/hr     Hypoglycemia Recurrent. Pt's glucose is extremely labile.   --Hypoglycemia protocol -- Patient was on sliding scale coverage which was discontinued due to frequent hypoglycemic episodes.       AKI (acute kidney injury) (HCC) Cr on admission 2.14, up from prior 1.25.   Improving with hydration. --Monitor closely --Monitor BMP    Hypothyroidism Continue Synthroid    Type 1 diabetes mellitus without complications (HCC) Patient has very labile blood sugars and appears to be sensitive to insulin Continue scheduled insulin 2 units with meals Continue Semglee 5 units BID Appreciate diabetes coordinator input    Protein-calorie malnutrition, severe (HCC) Due to chronic illness, severe gastroparesis. --Appreciate RD recommendations --on nocturnal tube feeds   Adrenal insufficiency (Addison's disease) (HCC) Continue Cortef   Essential hypertension Continue Norvasc     Severe Malnutrition Severe malnutrition in context of chronic illness    Plan Nocturnal tube feeds:    Glucerna 1.5@70ml /hr x 14 hrs overnight (1800-0800)   Free water flushes 50ml q4 hours to maintain tube patency    Regimen provides 1470kcal/day, 81g/day protein and 1034ml/day of free water.    Glucerna Shake po TID, each supplement provides 220 kcal and 10 grams of protein  Liquid MVI daily via tube    Zinc 220mg  po daily x 30 days         Consultants: Interventional Radiology Procedures performed: Replacement of J tube  Disposition: Home Diet recommendation:  Carb modified diet DISCHARGE MEDICATION: Allergies as of 11/28/2022   No Known Allergies      Medication List      TAKE these medications    acetaminophen 500 MG tablet Commonly known as: TYLENOL Take 2 tablets (1,000 mg total) by mouth every 6 (six) hours as needed for headache, fever or moderate pain.   amLODipine 10 MG tablet Commonly known as: NORVASC Take 1 tablet (10 mg total) by mouth daily.   Basaglar KwikPen 100 UNIT/ML Inject 5 Units into the skin 2 (two) times daily.   divalproex 250 MG 24 hr tablet Commonly known as: DEPAKOTE ER Take 1 tablet (250 mg total) by mouth daily.   feeding supplement (GLUCERNA 1.5 CAL) Liqd Place 640 mLs into feeding tube at bedtime.   FLUoxetine 10 MG capsule Commonly known as: PROZAC Take 1 capsule (10 mg total) by mouth daily.   hydrocortisone 10 MG tablet Commonly known as: CORTEF Take 2 tablets (20 mg total) by mouth daily with breakfast AND 1 tablet (10 mg total) daily with supper.   insulin aspart 100 UNIT/ML injection Commonly known as: NovoLOG Inject 2 Units into the skin 3 (three) times daily with meals.   insulin aspart 100 UNIT/ML injection Commonly known as: novoLOG Inject 0-6 Units into the skin 4 (four) times daily - after meals and at bedtime.   levothyroxine 50 MCG tablet Commonly known as: SYNTHROID Take 1 tablet (50 mcg total) by mouth daily at 6 (six) AM.   loperamide 2 MG capsule Commonly known as: IMODIUM Take 2 capsules (4 mg total) by mouth as needed for diarrhea or loose stools.   methocarbamol 500 MG tablet Commonly known as: ROBAXIN Take 1 tablet (500 mg total) by mouth 2 (two) times daily as needed for muscle spasms.   multivitamin with minerals Tabs tablet Take 1 tablet by mouth daily.   promethazine 25 MG tablet Commonly known as: PHENERGAN Take 0.5 tablets (12.5 mg total) by mouth every 6 (six) hours as needed for up to 10 days for nausea or vomiting.   thiamine 100 MG tablet Commonly known as: Vitamin B-1 Take 1 tablet (100 mg total) by mouth daily.   zinc gluconate 50 MG tablet Take 1 tablet (50  mg total) by mouth daily.        Follow-up Information     [place], [place], MD. Call.   Specialty: General Surgery Why: Dr. Everlene Farrier will follow up with you next week.  Call to confirm or schedule appointment if you don't have one already. Contact information: [address] [place] [address] [phone]                Discharge Exam: Ceasar Mons Weights   11/26/22 0437 11/27/22 0458 11/28/22 0450  Weight: 49.4 kg 49.4 kg 50.2 kg   General exam: awake, alert, no acute distress HEENT: moist mucus membranes, hearing grossly normal  Respiratory system: CTAB, no wheezes, rales or rhonchi, normal respiratory effort. Cardiovascular system: normal S1/S2, RRR, no JVD, murmurs, rubs, gallops, no pedal edema.   Gastrointestinal system: soft, NT, ND, no HSM felt, +bowel sounds. Central nervous system: A&O x 4. no gross focal neurologic deficits, normal speech Extremities: moves all , no edema, normal tone Skin: dry, intact, normal temperature, normal  color, No rashes, lesions or ulcers Psychiatry: normal mood, congruent affect, judgement and insight appear normal  Condition at discharge: stable  The results of significant diagnostics from this hospitalization (including imaging, microbiology, ancillary and laboratory) are listed below for reference.   Imaging Studies: IR Replc Duoden/Jejuno Tube Percut W/Fluoro  Result Date: 11/22/2022 INDICATION: Dislodged jejunostomy tube EXAM: Replacement of jejunostomy tube using fluoroscopic guidance MEDICATIONS: None ANESTHESIA/SEDATION: Local analgesia CONTRAST:  8 mL Omnipaque 300-administered into the gastric lumen. FLUOROSCOPY: Radiation Exposure Index (as provided by the fluoroscopic device): 1.5 minutes (19 mGy) COMPLICATIONS: None immediate. PROCEDURE: Informed written consent was obtained from the patient after a thorough discussion of the procedural risks, benefits and alternatives. All questions were addressed. Maximal  Sterile Barrier Technique was utilized including caps, mask, sterile gowns, sterile gloves, sterile drape, hand hygiene and skin antiseptic. A timeout was performed prior to the initiation of the procedure. The patient was placed supine on the exam table. The mid abdomen was prepped and draped in the standard sterile fashion with inclusion of the existing stoma within the sterile field. Local analgesia was obtained with 1% lidocaine. A 5 French angled catheter was gently advanced through the stoma. Location within the small bowel was confirmed with injection of contrast material. Catheter and wire were advanced into the more distal small bowel. Over this wire, a new 16 French balloon retention jejunostomy tube was advanced into the bowel. Location with small bowel was again confirmed with injection of contrast material. Retention balloon was inflated with a approximately 10 mL sterile water and brought back to the lumen edge. The external bumper was brought down close to the stoma. No immediate complication. IMPRESSION: Successful replacement of jejunostomy tube with a new 16 French balloon retention jejunostomy tube through the existing stoma. Jejunostomy tube is ready for immediate use. Electronically Signed   By: Olive Bass M.D.   On: 11/22/2022 15:26   US Venous Img Lower Bilateral (DVT)  Result Date: 11/12/2022 CLINICAL DATA:  Bilateral lower extremity edema. EXAM: BILATERAL LOWER EXTREMITY VENOUS DOPPLER ULTRASOUND TECHNIQUE: Gray-scale sonography with graded compression, as well as color Doppler and duplex ultrasound were performed to evaluate the lower extremity deep venous systems from the level of the common femoral vein and including the common femoral, femoral, profunda femoral, popliteal and calf veins including the posterior tibial, peroneal and gastrocnemius veins when visible. The superficial great saphenous vein was also interrogated. Spectral Doppler was utilized to evaluate flow at rest  and with distal augmentation maneuvers in the common femoral, femoral and popliteal veins. COMPARISON:  11/21/2020 FINDINGS: RIGHT LOWER EXTREMITY Common Femoral Vein: No evidence of thrombus. Normal compressibility, respiratory phasicity and response to augmentation. Saphenofemoral Junction: No evidence of thrombus. Normal compressibility and flow on color Doppler imaging. Profunda Femoral Vein: No evidence of thrombus. Normal compressibility and flow on color Doppler imaging. Femoral Vein: No evidence of thrombus. Normal compressibility, respiratory phasicity and response to augmentation. Popliteal Vein: No evidence of thrombus. Normal compressibility, respiratory phasicity and response to augmentation. Calf Veins: No evidence of thrombus. Normal compressibility and flow on color Doppler imaging. Superficial Great Saphenous Vein: No evidence of thrombus. Normal compressibility. Venous Reflux:  None. Other Findings: No evidence of superficial thrombophlebitis or abnormal fluid collection. LEFT LOWER EXTREMITY Common Femoral Vein: No evidence of thrombus. Normal compressibility, respiratory phasicity and response to augmentation. Saphenofemoral Junction: No evidence of thrombus. Normal compressibility and flow on color Doppler imaging. Profunda Femoral Vein: No evidence of thrombus. Normal compressibility and flow on color  Doppler imaging. Femoral Vein: No evidence of thrombus. Normal compressibility, respiratory phasicity and response to augmentation. Popliteal Vein: No evidence of thrombus. Normal compressibility, respiratory phasicity and response to augmentation. Calf Veins: No evidence of thrombus. Normal compressibility and flow on color Doppler imaging. Superficial Great Saphenous Vein: No evidence of thrombus. Normal compressibility. Venous Reflux:  None. Other Findings: No evidence of superficial thrombophlebitis or abnormal fluid collection. IMPRESSION: No evidence of deep venous thrombosis in either lower  extremity. Electronically Signed   By: Irish Lack M.D.   On: 11/12/2022 15:14   DG ABDOMEN PEG TUBE LOCATION  Result Date: 11/02/2022 CLINICAL DATA:  [id number] Malnutrition (HCC) [id number] EXAM: ABDOMEN - 1 VIEW COMPARISON:  11/01/2022 CT FINDINGS: 30 mL of Gastrografin was injected into patient's existing percutaneous jejunostomy tube. Contrast is seen filling nondilated loops of proximal jejunum. No extraluminal contrast collections. Bowel gas pattern is nonobstructive. There is excreted contrast within the urinary bladder. No gross free intraperitoneal air on supine view. IMPRESSION: Percutaneous jejunostomy tube appears appropriately positioned. Electronically Signed   By: Duanne Guess D.O.   On: 11/02/2022 11:02   CT ABDOMEN PELVIS W CONTRAST  Result Date: 11/01/2022 CLINICAL DATA:  Left-sided abdominal pain. Nausea and vomiting. Dehydration. Jejunostomy tube. 10 lb weight loss in past month. EXAM: CT ABDOMEN AND PELVIS WITH CONTRAST TECHNIQUE: Multidetector CT imaging of the abdomen and pelvis was performed using the standard protocol following bolus administration of intravenous contrast. RADIATION DOSE REDUCTION: This exam was performed according to the departmental dose-optimization program which includes automated exposure control, adjustment of the mA and/or kV according to patient size and/or use of iterative reconstruction technique. CONTRAST:  75mL OMNIPAQUE IOHEXOL 300 MG/ML  SOLN COMPARISON:  10/01/2022 FINDINGS: Lower Chest: No acute findings. Hepatobiliary: No hepatic masses identified. Gallbladder is unremarkable. No evidence of biliary ductal dilatation. Pancreas:  No mass or inflammatory changes. Spleen: Within normal limits in size and appearance. Adrenals/Urinary Tract: No suspicious masses identified. No evidence of ureteral calculi or hydronephrosis. Stable mild diffuse bladder wall thickening, suspicious for cystitis. Stomach/Bowel: Jejunostomy tube in appropriate position.  Evidence of free intraperitoneal air. No evidence of bowel obstruction, inflammatory process or abscess. Vascular/Lymphatic: No pathologically enlarged lymph nodes. No acute vascular findings. Reproductive: Uterus is unremarkable. No adnexal masses identified. Small amount of free fluid in the dependent pelvis shows no significant change since previous study, and is most likely physiologic. Other:  None. Musculoskeletal:  No suspicious bone lesions identified. IMPRESSION: Jejunostomy tube in appropriate position. No evidence of bowel obstruction or other acute findings. Stable mild diffuse bladder wall thickening, suspicious for cystitis. Suggest correlation with urinalysis. Electronically Signed   By: Danae Orleans M.D.   On: 11/01/2022 19:01    Microbiology: Results for orders placed or performed during the hospital encounter of 11/01/22  Urine Culture (for pregnant, neutropenic or urologic patients or patients with an indwelling urinary catheter)     Status: None   Collection Time: 11/02/22  4:18 AM   Specimen: Urine, Clean Catch  Result Value Ref Range Status   Specimen Description   Final    URINE, CLEAN CATCH Performed at [facility], [address] [place], [place] [address]    Special Requests   Final    NONE Performed at [facility], [address] [place], [place] [address]    Culture   Final    NO GROWTH Performed at [facility], [address], [place], [place] [address]    Report Status  11/03/2022 FINAL  Final    Labs: CBC: Recent Labs  Lab 11/22/22 0202 11/22/22 0515 11/23/22 0522 11/24/22 0314 11/25/22 0628  WBC 6.5 5.5 3.5* 5.2 5.0  NEUTROABS 4.0  --   --   --   --   HGB 9.3* 8.5* 8.2* 8.1* 7.9*  HCT 28.7* 26.4* 24.6* 24.9* 24.3*  MCV 92.6 92.3 90.1 92.2 92.0  PLT 156 136* 109* 125* 125*   Basic Metabolic Panel: Recent Labs  Lab 11/22/22 0202 11/22/22 0515 11/23/22 0522 11/23/22 2215 11/24/22 0314 11/25/22 0628  11/26/22 0958  NA 138   < > 133* 137 141 137 139  K 4.9   < > 5.4* 4.9 5.5* 5.4* 5.1  CL 103   < > 105 108 110 107 106  CO2 25   < > 21* 22 25 26 26   GLUCOSE 58*   < > 376* 335* 92 206* 177*  BUN 30*   < > 41* 36* 38* 50* 55*  CREATININE 2.14*   < > 1.79* 1.70* 1.71* 1.59* 1.38*  CALCIUM 8.6*   < > 7.5* 8.0* 8.0* 7.9* 8.8*  MG 1.9  --   --   --  1.9  --   --   PHOS  --   --   --   --  3.4  --   --    < > = values in this interval not displayed.   Liver Function Tests: Recent Labs  Lab 11/22/22 0202  AST 22  ALT 17  ALKPHOS 64  BILITOT 0.5  PROT 6.4*  ALBUMIN 3.7   CBG: Recent Labs  Lab 11/27/22 2134 11/27/22 2358 11/28/22 0447 11/28/22 0759 11/28/22 0951  GLUCAP 330* 304* 210* 164* 138*    Discharge time spent: greater than 30 minutes.  Signed: Lucile Shutters, MD [facility] 11/28/2022

## 2022-11-28 NOTE — Plan of Care (Signed)
Resolve care plan, patient adequate for discharge.  Ashley Camacho Ashley Camacho

## 2022-11-28 NOTE — TOC Transition Note (Signed)
Transition of Care [facility]) - CM/SW Discharge Note   Patient Details  Name: WINDIE TRIPPETT MRN: [id number] Date of Birth: [birth year]  Transition [facility]) CM/SW Contact:  Chapman Fitch, RN Phone Number: 11/28/2022, 11:13 AM   Clinical Narrative:     No TOC needs identified for DC Message sent to bedside RN to confirm.      Barriers to Discharge: Continued Medical Work up   Patient Goals and CMS Choice      Discharge Placement                         Discharge Plan and Services Additional resources added to the After Visit Summary for       Post Acute Care Choice: NA                               Social Determinants of Health (SDOH) Interventions SDOH Screenings   Food Insecurity: No Food Insecurity (11/22/2022)  Housing: Low Risk  (11/22/2022)  Transportation Needs: No Transportation Needs (11/22/2022)  Utilities: Not At Risk (11/22/2022)  Depression (PHQ2-9): Low Risk  (11/11/2020)  Tobacco Use: Low Risk  (11/22/2022)     Readmission Risk Interventions    11/24/2022   11:04 AM 11/03/2021   10:37 AM 06/16/2021   12:30 PM  Readmission Risk Prevention Plan  Transportation Screening Complete Complete Complete  Medication Review [employer]) Complete Complete Complete  PCP or Specialist appointment within 3-5 days of discharge Complete Complete Complete  HRI or Home Care Consult  Not Complete   HRI or Home Care Consult Pt Refusal Comments  NA   SW Recovery Care/Counseling Consult Complete Not Complete Complete  SW Consult Not Complete Comments  NA   Palliative Care Screening Not Applicable Not Applicable Not Applicable  Skilled Nursing Facility Not Applicable Not Applicable Not Applicable

## 2022-11-28 NOTE — Progress Notes (Signed)
Midline removed, tip intact. No complications. Discharge instructions reviewed. Patient waiting for ride to arrive and bring her clothes to wear home.  Ashley Camacho

## 2022-12-01 NOTE — Telephone Encounter (Signed)
Pt did not get bx inpatient due to health condition. Spoke to pt and informed he that we will reach out to her once bx is scheduled.

## 2022-12-02 NOTE — Telephone Encounter (Signed)
Pt scheduled for biopsy on Thurs 7/25 @ 8:30a with arrival time 7:30. Pt aware of appt.   Please schedule MD only on 8/8 @ 11am (for biopsy results). Pt aware of appt and will also see on Mychart.

## 2022-12-04 DIAGNOSIS — E109 Type 1 diabetes mellitus without complications: Secondary | ICD-10-CM | POA: Diagnosis not present

## 2022-12-04 DIAGNOSIS — Z931 Gastrostomy status: Secondary | ICD-10-CM | POA: Diagnosis not present

## 2022-12-04 DIAGNOSIS — E440 Moderate protein-calorie malnutrition: Secondary | ICD-10-CM | POA: Diagnosis not present

## 2022-12-04 DIAGNOSIS — N1832 Chronic kidney disease, stage 3b: Secondary | ICD-10-CM | POA: Diagnosis not present

## 2022-12-04 DIAGNOSIS — K3184 Gastroparesis: Secondary | ICD-10-CM | POA: Diagnosis not present

## 2022-12-05 IMAGING — DX DG CHEST 1V PORT
1 series · 1 of 1 positions shown · non-contrast
Comparison: Chest radiograph dated 08/22/2021.

CLINICAL DATA: Hypoglycemia.

EXAM:
PORTABLE CHEST 1 VIEW

[chest ap]
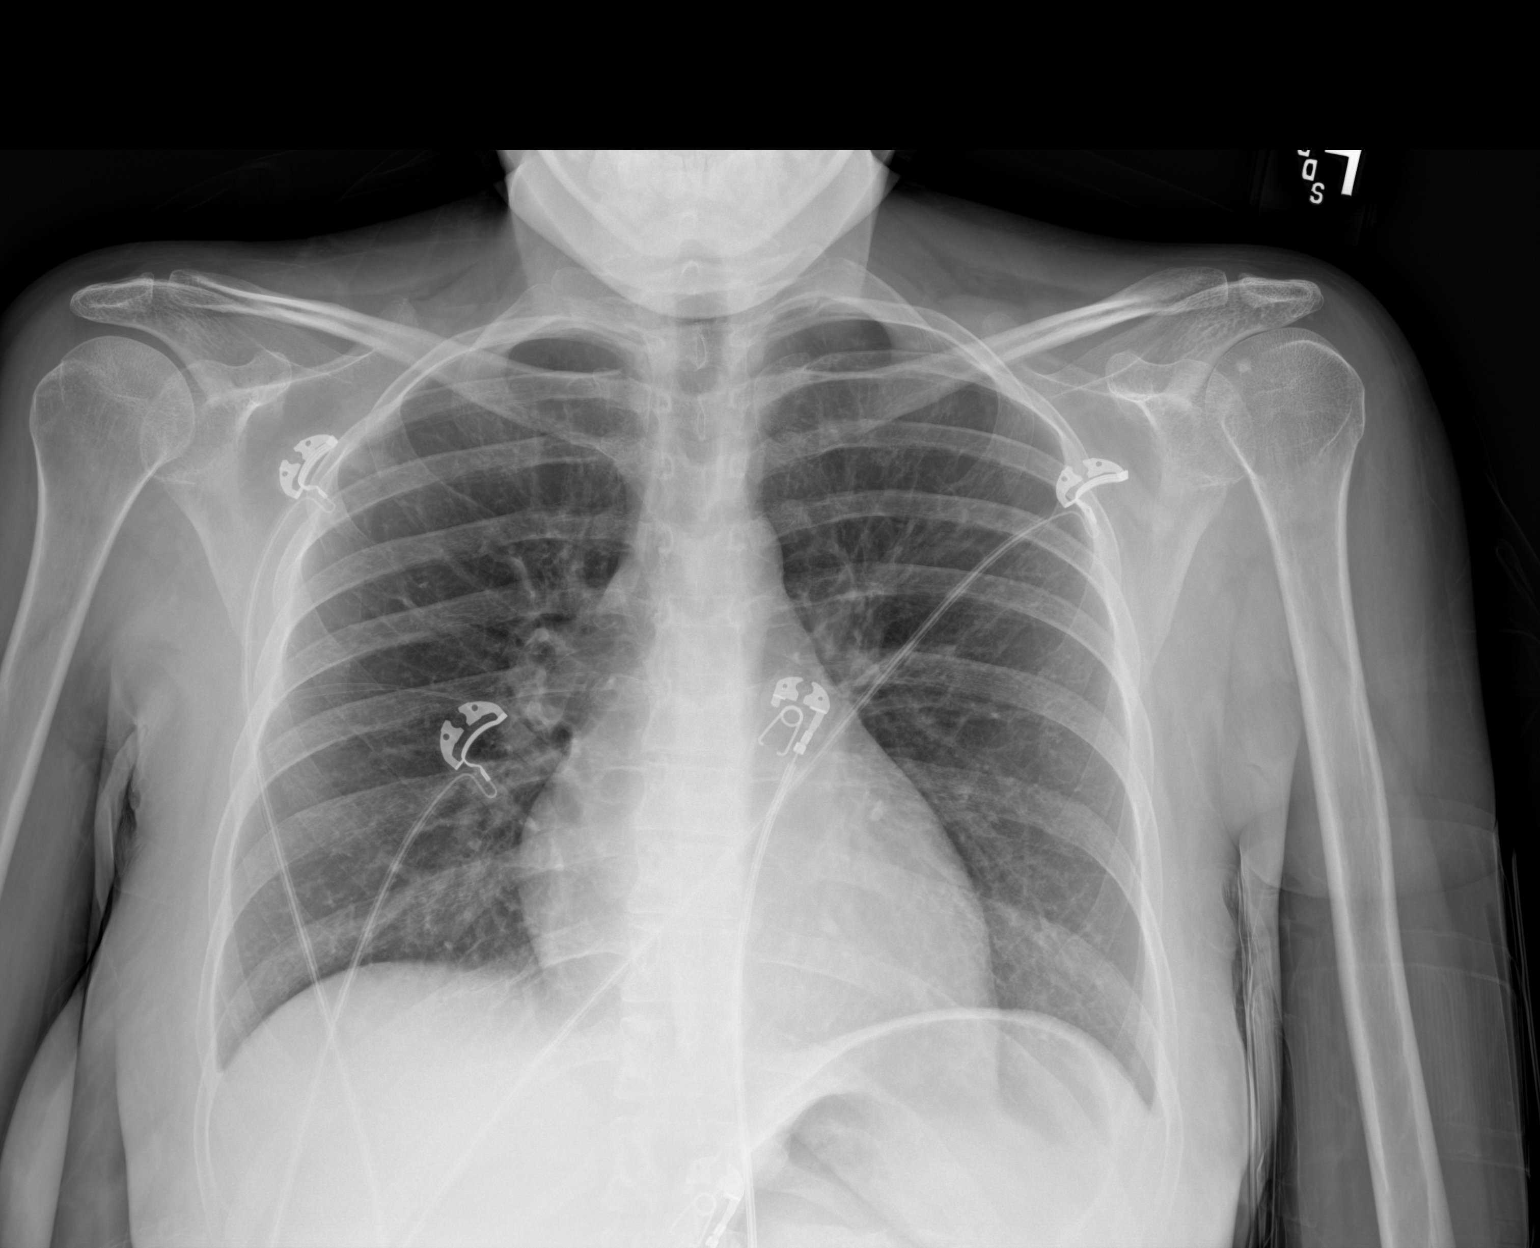

[1 of 1 positions shown; findings below may reference images not displayed]

FINDINGS: The heart size and mediastinal contours are within normal limits.
Both lungs are clear. The visualized skeletal structures are
unremarkable.
IMPRESSION: No active disease.

## 2022-12-09 ENCOUNTER — Ambulatory Visit: Payer: Medicaid Other | Admitting: Oncology

## 2022-12-13 ENCOUNTER — Other Ambulatory Visit: Payer: Self-pay | Admitting: Radiology

## 2022-12-13 DIAGNOSIS — Z01812 Encounter for preprocedural laboratory examination: Secondary | ICD-10-CM

## 2022-12-14 ENCOUNTER — Other Ambulatory Visit: Payer: Self-pay | Admitting: Gastroenterology

## 2022-12-14 DIAGNOSIS — E1010 Type 1 diabetes mellitus with ketoacidosis without coma: Secondary | ICD-10-CM | POA: Diagnosis not present

## 2022-12-14 DIAGNOSIS — I5020 Unspecified systolic (congestive) heart failure: Secondary | ICD-10-CM | POA: Diagnosis not present

## 2022-12-14 DIAGNOSIS — K3184 Gastroparesis: Secondary | ICD-10-CM | POA: Diagnosis not present

## 2022-12-14 DIAGNOSIS — K9423 Gastrostomy malfunction: Secondary | ICD-10-CM

## 2022-12-14 DIAGNOSIS — E1143 Type 2 diabetes mellitus with diabetic autonomic (poly)neuropathy: Secondary | ICD-10-CM

## 2022-12-14 DIAGNOSIS — R7989 Other specified abnormal findings of blood chemistry: Secondary | ICD-10-CM | POA: Diagnosis not present

## 2022-12-14 DIAGNOSIS — Z934 Other artificial openings of gastrointestinal tract status: Secondary | ICD-10-CM | POA: Diagnosis not present

## 2022-12-14 DIAGNOSIS — E46 Unspecified protein-calorie malnutrition: Secondary | ICD-10-CM | POA: Diagnosis not present

## 2022-12-14 DIAGNOSIS — K9413 Enterostomy malfunction: Secondary | ICD-10-CM | POA: Diagnosis not present

## 2022-12-14 DIAGNOSIS — D638 Anemia in other chronic diseases classified elsewhere: Secondary | ICD-10-CM | POA: Diagnosis not present

## 2022-12-14 DIAGNOSIS — N184 Chronic kidney disease, stage 4 (severe): Secondary | ICD-10-CM | POA: Diagnosis not present

## 2022-12-15 ENCOUNTER — Encounter: Payer: Self-pay | Admitting: Radiology

## 2022-12-15 NOTE — H&P (Signed)
Chief Complaint: Patient was seen in consultation today for anemia at the request of Yu,Zhou  Referring Physician(s): Yu,Zhou  Supervising Physician: Gilmer Mor  Patient Status: [facility] - Out-pt  History of Present Illness: Ashley Camacho is a 32 y.o. female with PMHx significant for normocytic anemia, thrombocytopenia, diabetes type 1, adrenal insufficiency, CKD III, malnutrition, history of DVT, zinc deficiency, gastroparesis with jejunostomy in place, HTN, and CHF. The patient has been seen by oncology with request for bone marrow biopsy to further evaluate the patient's anemia.  The patient has Jejunostomy in place which was originally surgically placed and last exchanged to 77F balloon retention J-tube with our service 7/1 secondary to clogging. The patient states her J-tube is now working fine today and there is no clogging or leaking.   The patient denies any current chest pain or shortness of breath. She has no known complications to sedation.    Past Medical History:  Diagnosis Date   Acute metabolic encephalopathy 06/10/2022   Adrenal insufficiency (HCC)    Anemia    CHF (congestive heart failure) (HCC)    Gastroparesis    Hypertension    Type 1 diabetes (HCC)     Past Surgical History:  Procedure Laterality Date   BIOPSY  01/14/2021   Procedure: BIOPSY;  Surgeon: Tressia Danas, MD;  Location: [facility] ENDOSCOPY;  Service: Gastroenterology;;   CESAREAN SECTION     CESAREAN SECTION WITH BILATERAL TUBAL LIGATION N/A 02/17/2021   Procedure: CESAREAN SECTION WITH BILATERAL TUBAL LIGATION;  Surgeon: [name], DO;  Location: [facility] LD ORS;  Service: Obstetrics;  Laterality: N/A;   ESOPHAGOGASTRODUODENOSCOPY (EGD) WITH PROPOFOL N/A 01/14/2021   Procedure: ESOPHAGOGASTRODUODENOSCOPY (EGD) WITH PROPOFOL;  Surgeon: Tressia Danas, MD;  Location: [facility] ENDOSCOPY;  Service: Gastroenterology;  Laterality: N/A;   IR REPLC DUODEN/JEJUNO TUBE PERCUT W/FLUORO  11/22/2022    JEJUNOSTOMY N/A 06/13/2022   Procedure: Rance Muir;  Surgeon: Leafy Ro, MD;  Location: [facility] ORS;  Service: General;  Laterality: N/A;   MOUTH SURGERY      Allergies: Patient has no known allergies.  Medications: Prior to Admission medications   Medication Sig Start Date End Date Taking? Authorizing Provider  acetaminophen (TYLENOL) 500 MG tablet Take 2 tablets (1,000 mg total) by mouth every 6 (six) hours as needed for headache, fever or moderate pain. 08/05/22   Loyce Dys, MD  amLODipine (NORVASC) 10 MG tablet Take 1 tablet (10 mg total) by mouth daily. 11/16/22 12/16/22  Leeroy Bock, MD  divalproex (DEPAKOTE ER) 250 MG 24 hr tablet Take 1 tablet (250 mg total) by mouth daily. 08/05/22   Loyce Dys, MD  FLUoxetine (PROZAC) 10 MG capsule Take 1 capsule (10 mg total) by mouth daily. 08/05/22 11/22/22  Loyce Dys, MD  hydrocortisone (CORTEF) 10 MG tablet Take 2 tablets (20 mg total) by mouth daily with breakfast AND 1 tablet (10 mg total) daily with supper. 08/05/22   Loyce Dys, MD  insulin aspart (NOVOLOG) 100 UNIT/ML injection Inject 2 Units into the skin 3 (three) times daily with meals. Patient not taking: Reported on 11/22/2022 08/05/22 01/19/23  Loyce Dys, MD  insulin aspart (NOVOLOG) 100 UNIT/ML injection Inject 0-6 Units into the skin 4 (four) times daily - after meals and at bedtime. 11/15/22   Leeroy Bock, MD  Insulin Glargine [facility]) 100 UNIT/ML Inject 5 Units into the skin 2 (two) times daily. 11/15/22 05/22/23  Leeroy Bock, MD  levothyroxine (SYNTHROID) 50 MCG  tablet Take 1 tablet (50 mcg total) by mouth daily at 6 (six) AM. 08/05/22 11/19/22  Loyce Dys, MD  loperamide (IMODIUM) 2 MG capsule Take 2 capsules (4 mg total) by mouth as needed for diarrhea or loose stools. 11/15/22   Leeroy Bock, MD  methocarbamol (ROBAXIN) 500 MG tablet Take 1 tablet (500 mg total) by mouth 2 (two) times daily as needed for muscle spasms. 11/28/22  12/28/22  Lucile Shutters, MD  Multiple Vitamin (MULTIVITAMIN WITH MINERALS) TABS tablet Take 1 tablet by mouth daily. 11/16/22   Leeroy Bock, MD  Nutritional Supplements (FEEDING SUPPLEMENT, GLUCERNA 1.5 CAL,) LIQD Place 640 mLs into feeding tube at bedtime. 11/15/22   Leeroy Bock, MD  promethazine (PHENERGAN) 25 MG tablet Take 0.5 tablets (12.5 mg total) by mouth every 6 (six) hours as needed for up to 10 days for nausea or vomiting. 11/28/22 12/08/22  Lucile Shutters, MD  thiamine (VITAMIN B-1) 100 MG tablet Take 1 tablet (100 mg total) by mouth daily. Patient not taking: Reported on 11/19/2022 11/16/22 12/16/22  Leeroy Bock, MD  zinc gluconate 50 MG tablet Take 1 tablet (50 mg total) by mouth daily. 11/20/22   Rickard Patience, MD     Family History  Problem Relation Age of Onset   Breast cancer Mother    Seizures Mother    Diabetes type I Father    CAD Father    Lung cancer Maternal Grandfather    CAD Paternal Grandmother    CAD Paternal Grandfather    Ovarian cancer Neg Hx     Social History   Socioeconomic History   Marital status: Single    Spouse name: Not on file   Number of children: Not on file   Years of education: Not on file   Highest education level: Not on file  Occupational History   Occupation: home maker  Tobacco Use   Smoking status: Never    Passive exposure: Never   Smokeless tobacco: Never  Vaping Use   Vaping status: Never Used  Substance and Sexual Activity   Alcohol use: No   Drug use: No   Sexual activity: Yes    Birth control/protection: None  Other Topics Concern   Not on file  Social History Narrative   Not on file   Social Determinants of Health   Financial Resource Strain: Medium Risk (09/01/2021)   Received from [facility]   Overall Financial Resource Strain (CARDIA)  Food Insecurity: No Food Insecurity (11/22/2022)   Hunger Vital Sign    Worried About Running Out of Food in the Last Year: Never true     Ran Out of Food in the Last Year: Never true  Transportation Needs: No Transportation Needs (11/22/2022)   PRAPARE - Administrator, Civil Service (Medical): No    Lack of Transportation (Non-Medical): No  Physical Activity: Inactive (08/19/2021)   Received from [facility], [facility]   Exercise Vital Sign    Days of Exercise per Week: 0 days    Minutes of Exercise per Session: 0 min  Stress: Not on file  Social Connections: Unknown (08/19/2021)   Received from [facility]   Social Connection and Isolation Panel [NHANES]   Review of Systems: A 12 point ROS discussed and pertinent positives are indicated in the HPI above.  All other systems are negative.  Review of Systems  Vital Signs: BP 133/88   Pulse 84  Temp 98.2 F (36.8 C) (Oral)   Resp 20   Ht 5\' 1"  (1.549 m)   Wt 100 lb (45.4 kg)   SpO2 100%   BMI 18.89 kg/m   Physical Exam Constitutional:      General: She is not in acute distress. HENT:     Head: Normocephalic and atraumatic.  Cardiovascular:     Rate and Rhythm: Normal rate.  Pulmonary:     Effort: Pulmonary effort is normal. No respiratory distress.  Abdominal:     Palpations: Abdomen is soft.     Comments: Jejunostomy in place with dressing intact and tube without evidence of leakage at this time. Skin site without drainage or skin breakdown.  Skin:    General: Skin is warm and dry.  Neurological:     Mental Status: She is alert and oriented to person, place, and time.     Imaging: IR Replc Duoden/Jejuno Tube Percut W/Fluoro  Result Date: 11/22/2022 INDICATION: Dislodged jejunostomy tube EXAM: Replacement of jejunostomy tube using fluoroscopic guidance MEDICATIONS: None ANESTHESIA/SEDATION: Local analgesia CONTRAST:  8 mL Omnipaque 300-administered into the gastric lumen. FLUOROSCOPY: Radiation Exposure Index (as provided by the fluoroscopic device): 1.5 minutes (19 mGy) COMPLICATIONS:  None immediate. PROCEDURE: Informed written consent was obtained from the patient after a thorough discussion of the procedural risks, benefits and alternatives. All questions were addressed. Maximal Sterile Barrier Technique was utilized including caps, mask, sterile gowns, sterile gloves, sterile drape, hand hygiene and skin antiseptic. A timeout was performed prior to the initiation of the procedure. The patient was placed supine on the exam table. The mid abdomen was prepped and draped in the standard sterile fashion with inclusion of the existing stoma within the sterile field. Local analgesia was obtained with 1% lidocaine. A 5 French angled catheter was gently advanced through the stoma. Location within the small bowel was confirmed with injection of contrast material. Catheter and wire were advanced into the more distal small bowel. Over this wire, a new 16 French balloon retention jejunostomy tube was advanced into the bowel. Location with small bowel was again confirmed with injection of contrast material. Retention balloon was inflated with a approximately 10 mL sterile water and brought back to the lumen edge. The external bumper was brought down close to the stoma. No immediate complication. IMPRESSION: Successful replacement of jejunostomy tube with a new 16 French balloon retention jejunostomy tube through the existing stoma. Jejunostomy tube is ready for immediate use. Electronically Signed   By: Olive Bass M.D.   On: 11/22/2022 15:26    Labs:  CBC: Recent Labs    11/22/22 0515 11/23/22 0522 11/24/22 0314 11/25/22 0628  WBC 5.5 3.5* 5.2 5.0  HGB 8.5* 8.2* 8.1* 7.9*  HCT 26.4* 24.6* 24.9* 24.3*  PLT 136* 109* 125* 125*    COAGS: Recent Labs    02/27/22 2226 06/09/22 1744  INR 2.2* 1.4*  APTT 45*  --     BMP: Recent Labs    11/23/22 2215 11/24/22 0314 11/25/22 0628 11/26/22 0958  NA 137 141 137 139  K 4.9 5.5* 5.4* 5.1  CL 108 110 107 106  CO2 22 25 26 26    GLUCOSE 335* 92 206* 177*  BUN 36* 38* 50* 55*  CALCIUM 8.0* 8.0* 7.9* 8.8*  CREATININE 1.70* 1.71* 1.59* 1.38*  GFRNONAA 41* 41* 44* 52*    LIVER FUNCTION TESTS: Recent Labs    11/06/22 0422 11/10/22 0555 11/13/22 0642 11/22/22 0202  BILITOT 0.4 0.6 0.6 0.5  AST 20 30 39 22  ALT 10 16 28 17   ALKPHOS 50 46 58 64  PROT 5.3* 5.5* 4.8* 6.4*  ALBUMIN 3.1* 3.1* 2.6* 3.7     Assessment and Plan: This is a 32 year old female with PMHx significant for normocytic anemia, thrombocytopenia, diabetes type 1, adrenal insufficiency, CKD III, malnutrition, history of DVT, zinc deficiency, gastroparesis with jejunostomy in place, HTN, and CHF. The patient has been seen by oncology with request for bone marrow biopsy to further evaluate the patient's anemia.   The patient has been NPO, labs and vitals have been reviewed.  Risks and benefits of image guided bone marrow biopsy with moderate sedation was discussed with the patient and/or patient's family including, but not limited to bleeding, infection, damage to adjacent structures or low yield requiring additional tests.  All of the questions were answered and there is agreement to proceed.  Consent signed and in chart.   Thank you for this interesting consult.  I greatly enjoyed meeting DELBRA ZELLARS and look forward to participating in their care.  A copy of this report was sent to the requesting provider on this date.  Electronically Signed: Berneta Levins, PA-C 12/16/2022, 8:35 AM   I spent a total of  15 Minutes  in face to face in clinical consultation, greater than 50% of which was counseling/coordinating care for anemia.

## 2022-12-15 NOTE — Progress Notes (Signed)
Patient for IR Bone Marrow Biopsy and GTube check on Thurs 12/16/2022, I called and spoke with the patient on the phone and gave pre-procedure instructions. Pt was made aware to be here at 7:30a, NPO after MN prior to procedure as well as driver post procedure/recovery/discharge. Pt stated understanding.  Called 12/15/2022

## 2022-12-16 ENCOUNTER — Ambulatory Visit
Admission: RE | Admit: 2022-12-16 | Discharge: 2022-12-16 | Disposition: A | Discharge status: Home / Self Care | Payer: Medicaid Other | Source: Ambulatory Visit | Attending: Oncology | Admitting: Oncology

## 2022-12-16 ENCOUNTER — Ambulatory Visit
Admission: RE | Admit: 2022-12-16 | Discharge: 2022-12-16 | Disposition: A | Discharge status: Home / Self Care | Payer: Medicaid Other | Source: Ambulatory Visit | Attending: Gastroenterology | Admitting: Gastroenterology

## 2022-12-16 ENCOUNTER — Other Ambulatory Visit: Payer: Self-pay

## 2022-12-16 ENCOUNTER — Encounter: Payer: Self-pay | Admitting: Radiology

## 2022-12-16 VITALS — BP 118/83 | HR 72 | Temp 98.2°F | Resp 14 | Ht 61.0 in | Wt 100.0 lb

## 2022-12-16 DIAGNOSIS — D696 Thrombocytopenia, unspecified: Secondary | ICD-10-CM | POA: Insufficient documentation

## 2022-12-16 DIAGNOSIS — K3184 Gastroparesis: Secondary | ICD-10-CM | POA: Diagnosis not present

## 2022-12-16 DIAGNOSIS — D649 Anemia, unspecified: Secondary | ICD-10-CM | POA: Insufficient documentation

## 2022-12-16 DIAGNOSIS — Z681 Body mass index (BMI) 19 or less, adult: Secondary | ICD-10-CM | POA: Diagnosis not present

## 2022-12-16 DIAGNOSIS — Z539 Procedure and treatment not carried out, unspecified reason: Secondary | ICD-10-CM | POA: Diagnosis not present

## 2022-12-16 DIAGNOSIS — E46 Unspecified protein-calorie malnutrition: Secondary | ICD-10-CM | POA: Insufficient documentation

## 2022-12-16 DIAGNOSIS — Z86718 Personal history of other venous thrombosis and embolism: Secondary | ICD-10-CM | POA: Diagnosis not present

## 2022-12-16 DIAGNOSIS — I509 Heart failure, unspecified: Secondary | ICD-10-CM | POA: Diagnosis not present

## 2022-12-16 DIAGNOSIS — K9423 Gastrostomy malfunction: Secondary | ICD-10-CM | POA: Insufficient documentation

## 2022-12-16 DIAGNOSIS — E1043 Type 1 diabetes mellitus with diabetic autonomic (poly)neuropathy: Secondary | ICD-10-CM | POA: Diagnosis not present

## 2022-12-16 DIAGNOSIS — N183 Chronic kidney disease, stage 3 unspecified: Secondary | ICD-10-CM | POA: Diagnosis not present

## 2022-12-16 DIAGNOSIS — D631 Anemia in chronic kidney disease: Secondary | ICD-10-CM | POA: Insufficient documentation

## 2022-12-16 DIAGNOSIS — E1022 Type 1 diabetes mellitus with diabetic chronic kidney disease: Secondary | ICD-10-CM | POA: Diagnosis not present

## 2022-12-16 DIAGNOSIS — Z01812 Encounter for preprocedural laboratory examination: Secondary | ICD-10-CM

## 2022-12-16 DIAGNOSIS — I130 Hypertensive heart and chronic kidney disease with heart failure and stage 1 through stage 4 chronic kidney disease, or unspecified chronic kidney disease: Secondary | ICD-10-CM | POA: Diagnosis not present

## 2022-12-16 DIAGNOSIS — D759 Disease of blood and blood-forming organs, unspecified: Secondary | ICD-10-CM | POA: Diagnosis not present

## 2022-12-16 HISTORY — PX: IR BONE MARROW BIOPSY & ASPIRATION: IMG5727

## 2022-12-16 LAB — CBC WITH DIFFERENTIAL/PLATELET
Abs Immature Granulocytes: 0.01 10*3/uL (ref 0.00–0.07)
Basophils Absolute: 0 10*3/uL (ref 0.0–0.1)
Basophils Relative: 1 %
Eosinophils Absolute: 0.1 10*3/uL (ref 0.0–0.5)
Eosinophils Relative: 2 %
HCT: 26.3 % — ABNORMAL LOW (ref 36.0–46.0)
Hemoglobin: 9.1 g/dL — ABNORMAL LOW (ref 12.0–15.0)
Immature Granulocytes: 0 %
Lymphocytes Relative: 39 %
Lymphs Abs: 1.8 10*3/uL (ref 0.7–4.0)
MCH: 29.5 pg (ref 26.0–34.0)
MCHC: 34.6 g/dL (ref 30.0–36.0)
MCV: 85.4 fL (ref 80.0–100.0)
Monocytes Absolute: 0.3 10*3/uL (ref 0.1–1.0)
Monocytes Relative: 6 %
Neutro Abs: 2.4 10*3/uL (ref 1.7–7.7)
Neutrophils Relative %: 52 %
Platelets: 123 10*3/uL — ABNORMAL LOW (ref 150–400)
RBC: 3.08 MIL/uL — ABNORMAL LOW (ref 3.87–5.11)
RDW: 13.2 % (ref 11.5–15.5)
WBC: 4.6 10*3/uL (ref 4.0–10.5)
nRBC: 0 % (ref 0.0–0.2)

## 2022-12-16 LAB — GLUCOSE, CAPILLARY
Glucose-Capillary: 255 mg/dL — ABNORMAL HIGH (ref 70–99)
Glucose-Capillary: 233 mg/dL — ABNORMAL HIGH (ref 70–99)

## 2022-12-16 MED ORDER — HEPARIN SOD (PORK) LOCK FLUSH 100 UNIT/ML IV SOLN
INTRAVENOUS | Status: AC
  Filled 2022-12-16: qty 5

## 2022-12-16 MED ORDER — DIPHENHYDRAMINE HCL 50 MG/ML IJ SOLN: 25.0000 mg | Freq: Once | INTRAMUSCULAR | Status: DC

## 2022-12-16 MED ORDER — FENTANYL CITRATE (PF) 100 MCG/2ML IJ SOLN
INTRAMUSCULAR | Status: AC | PRN
  Administered 2022-12-16 (×2): 50 ug via INTRAVENOUS

## 2022-12-16 MED ORDER — DIPHENHYDRAMINE HCL 25 MG PO CAPS
ORAL_CAPSULE | ORAL | Status: AC
  Filled 2022-12-16: qty 1

## 2022-12-16 MED ORDER — MIDAZOLAM HCL 2 MG/2ML IJ SOLN
INTRAMUSCULAR | Status: AC
  Filled 2022-12-16: qty 2

## 2022-12-16 MED ORDER — LIDOCAINE 1 % OPTIME INJ - NO CHARGE
9.0000 mL | Freq: Once | INTRAMUSCULAR | Status: AC
  Administered 2022-12-16: 9 mL via SUBCUTANEOUS
  Filled 2022-12-16: qty 10

## 2022-12-16 MED ORDER — SODIUM CHLORIDE 0.9 % IV SOLN: INTRAVENOUS | Status: DC

## 2022-12-16 MED ORDER — FENTANYL CITRATE (PF) 100 MCG/2ML IJ SOLN
INTRAMUSCULAR | Status: AC
  Filled 2022-12-16: qty 2

## 2022-12-16 MED ORDER — DIPHENHYDRAMINE HCL 50 MG/ML IJ SOLN
INTRAMUSCULAR | Status: AC
  Filled 2022-12-16: qty 1

## 2022-12-16 MED ORDER — MIDAZOLAM HCL 2 MG/2ML IJ SOLN
INTRAMUSCULAR | Status: AC | PRN
  Administered 2022-12-16 (×2): 1 mg via INTRAVENOUS

## 2022-12-16 MED ORDER — DIPHENHYDRAMINE HCL 25 MG PO CAPS
25.0000 mg | ORAL_CAPSULE | Freq: Once | ORAL | Status: AC
  Administered 2022-12-16: 25 mg via ORAL

## 2022-12-16 NOTE — Progress Notes (Signed)
Patient came back from procedure, BP was soft, lower back dressing was saturated with blood. Held pressure and changed dressing. Patient then complained about itching. Provider notified and came to bedside. Gave benadryl.

## 2022-12-16 NOTE — Procedures (Signed)
Interventional Radiology Procedure Note  Procedure: fluoro guided aspirate and core biopsy of right posterior iliac bone Complications: None Recommendations: - Bedrest supine x 1 hrs - OTC's PRN  Pain - Follow biopsy results  Signed,  Yvone Neu. Loreta Ave, DO, ABVM, RPVI

## 2022-12-17 LAB — SURGICAL PATHOLOGY

## 2022-12-23 ENCOUNTER — Encounter (HOSPITAL_COMMUNITY): Payer: Self-pay | Admitting: Oncology

## 2022-12-23 DIAGNOSIS — Z419 Encounter for procedure for purposes other than remedying health state, unspecified: Secondary | ICD-10-CM | POA: Diagnosis not present

## 2022-12-23 DIAGNOSIS — N1832 Chronic kidney disease, stage 3b: Secondary | ICD-10-CM | POA: Diagnosis not present

## 2022-12-23 DIAGNOSIS — K3184 Gastroparesis: Secondary | ICD-10-CM | POA: Diagnosis not present

## 2022-12-23 DIAGNOSIS — E440 Moderate protein-calorie malnutrition: Secondary | ICD-10-CM | POA: Diagnosis not present

## 2022-12-23 DIAGNOSIS — E109 Type 1 diabetes mellitus without complications: Secondary | ICD-10-CM | POA: Diagnosis not present

## 2022-12-23 DIAGNOSIS — Z931 Gastrostomy status: Secondary | ICD-10-CM | POA: Diagnosis not present

## 2022-12-30 ENCOUNTER — Other Ambulatory Visit: Payer: Self-pay

## 2022-12-30 ENCOUNTER — Inpatient Hospital Stay: Payer: Medicaid Other | Attending: Oncology | Admitting: Oncology

## 2022-12-30 ENCOUNTER — Emergency Department
Admission: EM | Admit: 2022-12-30 | Discharge: 2022-12-30 | Disposition: A | Discharge status: Home / Self Care | Payer: Medicaid Other | Attending: Emergency Medicine | Admitting: Emergency Medicine

## 2022-12-30 ENCOUNTER — Encounter: Payer: Self-pay | Admitting: Oncology

## 2022-12-30 VITALS — BP 82/52 | HR 101 | Temp 96.0°F | Resp 18 | Wt 98.5 lb

## 2022-12-30 DIAGNOSIS — F419 Anxiety disorder, unspecified: Secondary | ICD-10-CM | POA: Diagnosis not present

## 2022-12-30 DIAGNOSIS — I959 Hypotension, unspecified: Secondary | ICD-10-CM | POA: Diagnosis not present

## 2022-12-30 DIAGNOSIS — E43 Unspecified severe protein-calorie malnutrition: Secondary | ICD-10-CM | POA: Diagnosis not present

## 2022-12-30 DIAGNOSIS — Z87440 Personal history of urinary (tract) infections: Secondary | ICD-10-CM | POA: Insufficient documentation

## 2022-12-30 DIAGNOSIS — I9589 Other hypotension: Secondary | ICD-10-CM

## 2022-12-30 DIAGNOSIS — D631 Anemia in chronic kidney disease: Secondary | ICD-10-CM | POA: Diagnosis not present

## 2022-12-30 DIAGNOSIS — Z79899 Other long term (current) drug therapy: Secondary | ICD-10-CM | POA: Diagnosis not present

## 2022-12-30 DIAGNOSIS — D72819 Decreased white blood cell count, unspecified: Secondary | ICD-10-CM | POA: Insufficient documentation

## 2022-12-30 DIAGNOSIS — Z5986 Financial insecurity: Secondary | ICD-10-CM | POA: Diagnosis not present

## 2022-12-30 DIAGNOSIS — N1832 Chronic kidney disease, stage 3b: Secondary | ICD-10-CM | POA: Diagnosis not present

## 2022-12-30 DIAGNOSIS — E1043 Type 1 diabetes mellitus with diabetic autonomic (poly)neuropathy: Secondary | ICD-10-CM | POA: Diagnosis not present

## 2022-12-30 DIAGNOSIS — E861 Hypovolemia: Secondary | ICD-10-CM | POA: Diagnosis not present

## 2022-12-30 DIAGNOSIS — Z833 Family history of diabetes mellitus: Secondary | ICD-10-CM | POA: Insufficient documentation

## 2022-12-30 DIAGNOSIS — K3184 Gastroparesis: Secondary | ICD-10-CM | POA: Diagnosis not present

## 2022-12-30 DIAGNOSIS — R5383 Other fatigue: Secondary | ICD-10-CM | POA: Insufficient documentation

## 2022-12-30 DIAGNOSIS — R55 Syncope and collapse: Secondary | ICD-10-CM | POA: Insufficient documentation

## 2022-12-30 DIAGNOSIS — E860 Dehydration: Secondary | ICD-10-CM | POA: Insufficient documentation

## 2022-12-30 DIAGNOSIS — I5042 Chronic combined systolic (congestive) and diastolic (congestive) heart failure: Secondary | ICD-10-CM | POA: Diagnosis not present

## 2022-12-30 DIAGNOSIS — D649 Anemia, unspecified: Secondary | ICD-10-CM

## 2022-12-30 DIAGNOSIS — Z801 Family history of malignant neoplasm of trachea, bronchus and lung: Secondary | ICD-10-CM | POA: Insufficient documentation

## 2022-12-30 DIAGNOSIS — Z803 Family history of malignant neoplasm of breast: Secondary | ICD-10-CM | POA: Insufficient documentation

## 2022-12-30 DIAGNOSIS — E039 Hypothyroidism, unspecified: Secondary | ICD-10-CM | POA: Insufficient documentation

## 2022-12-30 DIAGNOSIS — Z8249 Family history of ischemic heart disease and other diseases of the circulatory system: Secondary | ICD-10-CM | POA: Diagnosis not present

## 2022-12-30 DIAGNOSIS — E10649 Type 1 diabetes mellitus with hypoglycemia without coma: Secondary | ICD-10-CM | POA: Insufficient documentation

## 2022-12-30 DIAGNOSIS — N183 Chronic kidney disease, stage 3 unspecified: Secondary | ICD-10-CM | POA: Diagnosis not present

## 2022-12-30 LAB — CBC
HCT: 22.4 % — ABNORMAL LOW (ref 36.0–46.0)
Hemoglobin: 7.6 g/dL — ABNORMAL LOW (ref 12.0–15.0)
MCH: 29.9 pg (ref 26.0–34.0)
MCHC: 33.9 g/dL (ref 30.0–36.0)
MCV: 88.2 fL (ref 80.0–100.0)
Platelets: 146 10*3/uL — ABNORMAL LOW (ref 150–400)
RBC: 2.54 MIL/uL — ABNORMAL LOW (ref 3.87–5.11)
RDW: 13.4 % (ref 11.5–15.5)
WBC: 3.7 10*3/uL — ABNORMAL LOW (ref 4.0–10.5)
nRBC: 0 % (ref 0.0–0.2)

## 2022-12-30 LAB — COMPREHENSIVE METABOLIC PANEL
ALT: 12 U/L (ref 0–44)
AST: 24 U/L (ref 15–41)
Albumin: 4.1 g/dL (ref 3.5–5.0)
Alkaline Phosphatase: 54 U/L (ref 38–126)
Anion gap: 7 (ref 5–15)
BUN: 29 mg/dL — ABNORMAL HIGH (ref 6–20)
CO2: 24 mmol/L (ref 22–32)
Calcium: 8.7 mg/dL — ABNORMAL LOW (ref 8.9–10.3)
Chloride: 108 mmol/L (ref 98–111)
Creatinine, Ser: 1.69 mg/dL — ABNORMAL HIGH (ref 0.44–1.00)
GFR, Estimated: 41 mL/min — ABNORMAL LOW (ref >60)
Glucose, Bld: 57 mg/dL — ABNORMAL LOW (ref 70–99)
Potassium: 4.1 mmol/L (ref 3.5–5.1)
Sodium: 139 mmol/L (ref 135–145)
Total Bilirubin: 0.3 mg/dL (ref 0.3–1.2)
Total Protein: 6.9 g/dL (ref 6.5–8.1)

## 2022-12-30 LAB — TYPE AND SCREEN
ABO/RH(D): A POS
Antibody Screen: NEGATIVE

## 2022-12-30 LAB — CBG MONITORING, ED: Glucose-Capillary: 92 mg/dL (ref 70–99)

## 2022-12-30 MED ORDER — SODIUM CHLORIDE 0.9 % IV BOLUS
500.0000 mL | Freq: Once | INTRAVENOUS | Status: AC
  Administered 2022-12-30: 500 mL via INTRAVENOUS

## 2022-12-30 MED ORDER — SODIUM CHLORIDE 0.9 % IV BOLUS
1000.0000 mL | Freq: Once | INTRAVENOUS | Status: AC
  Administered 2022-12-30: 1000 mL via INTRAVENOUS

## 2022-12-30 NOTE — Discharge Instructions (Signed)
You were seen in the emergency department today for your low blood pressure.  I suspect this was most likely related to dehydration as it was responding to fluids.  Your hemoglobin value was low today and would like you to follow-up with your hematologist in the next couple of days to have that rechecked.  Please return the emergency department if symptoms worsen or if you pass out.

## 2022-12-30 NOTE — Assessment & Plan Note (Addendum)
Chronic anemia, secondary to chronic kidney disease. Marrow biopsy results were reviewed and discussed with patient.  Slightly hypocellular marrow with trilineage hematopoiesis.  Cytogenetics normal. Hemoglobin was at 9.1. We discussed about erythropoietin replacement therapy. Shared decision was made to monitor another 3 months.  It hemoglobin progressively decreases, she will consider erythropoietin replacement therapy.

## 2022-12-30 NOTE — ED Triage Notes (Signed)
Pt sts that she was at the [facility] and her BP was low and than was sent over here. Pt seeing the [facility] for anemia.

## 2022-12-30 NOTE — Progress Notes (Signed)
Hematology/Oncology Progress note [phone] [phone]     Patient Care Team: Pcp, No as PCP - General Croitoru, Mihai, MD as PCP - Cardiology (Cardiology) Leafy Ro, MD as Consulting Physician (General Surgery) Rickard Patience, MD as Consulting Physician (Oncology)    Date of visit: 12/30/22  REASON FOR VISIT:  Anemia ASSESSMENT & PLAN:   Anemia in chronic kidney disease (CKD) Chronic anemia, secondary to chronic kidney disease. Marrow biopsy results were reviewed and discussed with patient.  Slightly hypocellular marrow with trilineage hematopoiesis.  Cytogenetics normal. Hemoglobin was at 9.1. We discussed about erythropoietin replacement therapy. Shared decision was made to monitor another 3 months.  It hemoglobin progressively decreases, she will consider erythropoietin replacement therapy.    Protein-calorie malnutrition, severe (HCC) Continue tube feeding and follow-up with nutritionist.  DVT (deep venous thrombosis) (HCC) History of DVT.  Currently off anticoagulation.  Gastroparesis Follow-up with endocrinologist for diabetes control.  Antiemetics as needed.  Hypotension Patient was hypotensive in the clinic.  Likely due to poor oral intake from gastroparesis. Recommend patient go to emergency room for further evaluation and management.  She declined at first.  Then patient felt lightheaded, about to pass out.  Advised her to go to ER and patient agrees   Orders Placed This Encounter  Procedures   CBC with Differential (Cancer Center Only)    Standing Status:   Future    Standing Expiration Date:   12/30/2023   Iron and TIBC    Standing Status:   Future    Standing Expiration Date:   12/30/2023   Ferritin    Standing Status:   Future    Standing Expiration Date:   12/30/2023   Follow-up 3 months All questions were answered. The patient knows to call the clinic with any problems, questions or concerns.  Rickard Patience, MD, PhD [facility]  Hematology Oncology 12/30/2022   INTERVAL HISTORY-   32 y.o. female with past medical history including diabetes type 1, severe gastroparesis, severe malnutrition, status post jejunostomy tube, adrenal insufficiency, CKD stage IIIb, hypertension, hypothyroidism, history of right upper extremity DVT, off anticoagulation, chronic anemia presents to reestablish care. Patient was last seen by me in July 2022.  Patient had a workup done at that time and there was plan for bone marrow biopsy and she lost to follow-up.  11/01/2022 - 11/15/2022  Patient was admitted due to gastroparesis, left-sided abdominal pain, nausea vomiting dehydration.  Hematology was consulted for anemia, hemoglobin dropped to 6.9.  Patient was seen by my colleague.  Workup showed normal LDH, normal haptoglobin.  Negative M protein on SPEP.    Normal B1, B6.  Normal folate level.  Erythropoietin level 11.3. She had a normal B12 level in March 2023.  she has a low zinc level 34. Patient received PRBC transfusion  Today patient was accompanied by her mother to establish care.  She continues to feel tired. She uses her J-tube for feeding.  She continues to have chronic left-sided abdominal pain.   INTERVAL HISTORY ARIYAH NOON is a 32 y.o. female who has above history reviewed by me today presents for follow up visit for anemia 12/16/2022, hemoglobin was 9.1. Bone marrow biopsy showed Slightly hypocellular bone marrow for age with trilineage hematopoiesis. There is no evidence of a lymphoproliferative disorder.  Cytogenetics are normal. Today patient feels nauseated.  Very poor oral intake.   Review of systems- Review of Systems  Constitutional:  Positive for appetite change and fatigue.  Eyes:  Negative for icterus.  Respiratory:  Negative for shortness of breath.   Cardiovascular:  Negative for chest pain.  Gastrointestinal:  Positive for abdominal pain. Negative for nausea and vomiting.  Genitourinary:  Negative for  difficulty urinating.   Musculoskeletal:  Negative for arthralgias.  Skin:  Negative for rash.  Neurological:  Negative for headaches.  Hematological:  Negative for adenopathy.  Psychiatric/Behavioral:  Negative for confusion.     No Known Allergies  Patient Active Problem List   Diagnosis Date Noted   Anemia in chronic kidney disease (CKD) 12/30/2022    Priority: High   DVT (deep venous thrombosis) (HCC) 11/20/2022    Priority: Medium    Hypotension 06/10/2022    Priority: Medium    Protein-calorie malnutrition, severe (HCC) 03/11/2021    Priority: Medium    Zinc deficiency     Priority: Medium    Gastroparesis 04/22/2016    Priority: Medium    Normocytic anemia 11/12/2022    Priority: Low   AKI (acute kidney injury) (HCC) 11/22/2022   Dislodged jejunostomy tube 11/22/2022   Type 1 diabetes mellitus without complications (HCC) 11/22/2022   Failure to thrive in adult 11/13/2022   Hyperglycemia 11/13/2022   Intractable nausea and vomiting 11/13/2022   Severe recurrent major depression (HCC) 11/03/2022   CKD stage 3a, GFR 45-59 ml/min (HCC) 11/01/2022   Elevated serum creatinine 11/01/2022   Abdominal pain 11/01/2022   Cystitis 11/01/2022   Acute exacerbation of congestive heart failure (HCC) 08/23/2022   Diabetic ketoacidosis (HCC) 08/23/2022   Hypothermia 07/27/2022   Stage 3b chronic kidney disease (HCC) 06/29/2022   Hypovolemic shock (HCC) 06/28/2022   Jejunostomy tube present (HCC) 06/28/2022   Insomnia 06/20/2022   Hypophosphatemia 06/13/2022   Severe malnutrition (HCC) 06/13/2022   Pseudohyponatremia 06/10/2022   Hypomagnesemia 06/10/2022   Adrenal insufficiency (HCC) 06/09/2022   Acute gastroenteritis 02/28/2022   Hypokalemia 02/28/2022   Hypocalcemia 02/28/2022   Metabolic acidosis 02/28/2022   Essential hypertension 02/28/2022   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   Nausea & vomiting 02/28/2022   High anion gap metabolic acidosis 01/02/2022   UTI  (urinary tract infection) 11/03/2021   Hypoglycemia due to type 1 diabetes mellitus (HCC) 11/02/2021   Thrombocytopenia (HCC) 11/02/2021   Problem related to social environment 09/24/2021   Rhinovirus 09/22/2021   Acute on chronic HFrEF (heart failure with reduced ejection fraction) (HCC) 09/07/2021   Pneumonia due to human metapneumovirus 08/31/2021   Gastro-esophageal reflux disease with esophagitis 08/31/2021   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   Congestive heart failure (HCC) 08/31/2021   Hypoglycemia due to insulin 06/14/2021   DKA (diabetic ketoacidosis) (HCC) 06/14/2021   CKD stage 3 due to type 1 diabetes mellitus (HCC) 06/14/2021   Transaminitis 06/14/2021   Hypoglycemia associated with diabetes (HCC) 06/03/2021   DKA, type 1 (HCC) 05/20/2021   Major depressive disorder, recurrent episode, moderate (HCC) 05/08/2021   Right arm pain 05/07/2021   Rash of hand 05/07/2021   Hyperkalemia 05/06/2021   History of adrenal insufficiency 05/06/2021   CAP (community acquired pneumonia) 05/05/2021   Hypoglycemia 04/27/2021   Combined systolic and diastolic heart failure (HCC) 03/11/2021   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) 03/11/2021   Adrenal insufficiency (Addison's disease) (HCC) 03/10/2021   Postoperative hematoma involving genitourinary system following genitourinary procedure    S/P cesarean section 02/17/2021   Acute esophagitis    Leg edema    History of preterm delivery, currently pregnant    Pre-existing type 1 diabetes mellitus  during pregnancy in third trimester    Anemia of chronic disease    Type 1 diabetes mellitus affecting pregnancy in second trimester, antepartum    HFrEF (heart failure with reduced ejection fraction) (HCC)    Acute deep vein thrombosis (DVT) of brachial vein of right upper extremity (HCC) 12/17/2020   Anxiety 12/17/2020   Diabetic retinopathy (HCC) 12/17/2020   Chronic combined systolic and  diastolic CHF (congestive heart failure) (HCC)    Hyperemesis 12/13/2020   Type 1 diabetes mellitus with hypoglycemia without coma (HCC) 12/13/2020   Sunburn 12/13/2020   Hypothyroidism 12/10/2020   Malnutrition (HCC) 12/10/2020   Vitreous hemorrhage of right eye (HCC) 12/10/2020   Pleural effusion associated with pulmonary infection 11/25/2020   Pneumonia affecting pregnancy 11/24/2020   Diabetes mellitus affecting pregnancy, second trimester 11/24/2020   Edema 11/24/2020   Decreased urine output 11/24/2020   Shortness of breath    SOB (shortness of breath)    Social problem 11/21/2020   Nausea and vomiting during pregnancy prior to [redacted] weeks gestation 11/21/2020   Low serum vitamin B12    Delivery with history of C-section 11/20/2020   Absolute anemia    Acute kidney injury superimposed on chronic kidney disease (HCC)    Nausea and vomiting during pregnancy 10/23/2020   History of premature delivery, currently pregnant, second trimester 10/13/2020   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   Peripheral neuropathy 09/28/2018   Diabetic sensorimotor neuropathy (HCC) 04/19/2016   DKA (diabetic ketoacidoses) 04/03/2016   Acute bronchitis 06/01/2010   Microalbuminuria 09/26/2005     Past Medical History:  Diagnosis Date   Acute metabolic encephalopathy 06/10/2022   Adrenal insufficiency (HCC)    Anemia    CHF (congestive heart failure) (HCC)    Gastroparesis    Hypertension    Type 1 diabetes (HCC)      Past Surgical History:  Procedure Laterality Date   BIOPSY  01/14/2021   Procedure: BIOPSY;  Surgeon: Tressia Danas, MD;  Location: [facility] END[facility]COPY;  Service: Gastroenterology;;   CESAREAN SECTION     CESAREAN SECTION WITH BILATERAL TUBAL LIGATION N/A 02/17/2021   Procedure: CESAREAN SECTION WITH BILATERAL TUBAL LIGATION;  Surgeon: [name], DO;  Location: [facility] LD ORS;  Service: Obstetrics;  Laterality: N/A;   ESOPHAGOGASTRODUODENOSCOPY (EGD) WITH PROPOFOL N/A 01/14/2021    Procedure: ESOPHAGOGASTRODUODENOSCOPY (EGD) WITH PROPOFOL;  Surgeon: Tressia Danas, MD;  Location: [facility] ENDOSCOPY;  Service: Gastroenterology;  Laterality: N/A;   IR BONE MARROW BIOPSY & ASPIRATION  12/16/2022   IR REPLC DUODEN/JEJUNO TUBE PERCUT W/FLUORO  11/22/2022   JEJUNOSTOMY N/A 06/13/2022   Procedure: Rance Muir;  Surgeon: Leafy Ro, MD;  Location: [facility] ORS;  Service: General;  Laterality: N/A;   MOUTH SURGERY      Social History   Socioeconomic History   Marital status: Single    Spouse name: Not on file   Number of children: Not on file   Years of education: Not on file   Highest education level: Not on file  Occupational History   Occupation: home maker  Tobacco Use   Smoking status: Never    Passive exposure: Never   Smokeless tobacco: Never  Vaping Use   Vaping status: Never Used  Substance and Sexual Activity   Alcohol use: No   Drug use: No   Sexual activity: Yes    Birth control/protection: None  Other Topics Concern   Not on file  Social History Narrative   Not on file   Social Determinants of  Health   Financial Resource Strain: Medium Risk (09/01/2021)   Received from [facility]   Overall Financial Resource Strain (CARDIA)  Food Insecurity: No Food Insecurity (11/22/2022)   Hunger Vital Sign    Worried About Running Out of Food in the Last Year: Never true    Ran Out of Food in the Last Year: Never true  Transportation Needs: No Transportation Needs (11/22/2022)   PRAPARE - Administrator, Civil Service (Medical): No    Lack of Transportation (Non-Medical): No  Physical Activity: Inactive (08/19/2021)   [facility]   Exercise Vital Sign    Days of Exercise per Week: 0 days    Minutes of Exercise per Session: 0 min  Stress: Not on file  Social Connections: Unknown (08/19/2021)   Received from [facility]   Social Connection and Isolation  Panel [NHANES]  Intimate Partner Violence: Not At Risk (11/22/2022)   Humiliation, Afraid, Rape, and Kick questionnaire    Fear of Current or Ex-Partner: No    Emotionally Abused: No    Physically Abused: No    Sexually Abused: No     Family History  Problem Relation Age of Onset   Breast cancer Mother    Seizures Mother    Diabetes type I Father    CAD Father    Lung cancer Maternal Grandfather    CAD Paternal Grandmother    CAD Paternal Grandfather    Ovarian cancer Neg Hx      Current Outpatient Medications:    acetaminophen (TYLENOL) 500 MG tablet, Take 2 tablets (1,000 mg total) by mouth every 6 (six) hours as needed for headache, fever or moderate pain., Disp: 30 tablet, Rfl: 3   amLODipine (NORVASC) 10 MG tablet, Take 1 tablet (10 mg total) by mouth daily., Disp: 30 tablet, Rfl: 0   divalproex (DEPAKOTE ER) 250 MG 24 hr tablet, Take 1 tablet (250 mg total) by mouth daily., Disp: 30 tablet, Rfl: 1   FLUoxetine (PROZAC) 10 MG capsule, Take 1 capsule (10 mg total) by mouth daily., Disp: 30 capsule, Rfl: 0   hydrocortisone (CORTEF) 10 MG tablet, Take 2 tablets (20 mg total) by mouth daily with breakfast AND 1 tablet (10 mg total) daily with supper., Disp: 90 tablet, Rfl: 0   insulin aspart (NOVOLOG) 100 UNIT/ML injection, Inject 0-6 Units into the skin 4 (four) times daily - after meals and at bedtime., Disp: 10 mL, Rfl: 0   Insulin Glargine (BASAGLAR KWIKPEN) 100 UNIT/ML, Inject 5 Units into the skin 2 (two) times daily., Disp: 15 mL, Rfl: 0   levothyroxine (SYNTHROID) 50 MCG tablet, Take 1 tablet (50 mcg total) by mouth daily at 6 (six) AM., Disp: 30 tablet, Rfl: 0   loperamide (IMODIUM) 2 MG capsule, Take 2 capsules (4 mg total) by mouth as needed for diarrhea or loose stools., Disp: 30 capsule, Rfl: 0   Multiple Vitamin (MULTIVITAMIN WITH MINERALS) TABS tablet, Take 1 tablet by mouth daily., Disp: , Rfl:    Nutritional Supplements (FEEDING SUPPLEMENT, GLUCERNA 1.5 CAL,) LIQD,  Place 640 mLs into feeding tube at bedtime., Disp: 1000 mL, Rfl: 0   promethazine (PHENERGAN) 25 MG tablet, Take 0.5 tablets (12.5 mg total) by mouth every 6 (six) hours as needed for up to 10 days for nausea or vomiting., Disp: 15 tablet, Rfl: 0   zinc gluconate 50 MG tablet, Take 1 tablet (50 mg total) by mouth  daily., Disp: 30 tablet, Rfl: 2   insulin aspart (NOVOLOG) 100 UNIT/ML injection, Inject 2 Units into the skin 3 (three) times daily with meals. (Patient not taking: Reported on 11/22/2022), Disp: 10 mL, Rfl: 0   Physical exam:  Vitals:   12/30/22 1047  BP: (!) 82/52  Pulse: (!) 101  Resp: 18  Temp: (!) 96 F (35.6 C)  TempSrc: Tympanic  SpO2: 100%  Weight: 98 lb 8 oz (44.7 kg)   Physical Exam Constitutional:      Appearance: She is not diaphoretic.     Comments: Mildly distressed due to nausea  HENT:     Head: Normocephalic and atraumatic.     Nose: Nose normal.     Mouth/Throat:     Pharynx: No oropharyngeal exudate.  Eyes:     General: No scleral icterus.    Pupils: Pupils are equal, round, and reactive to light.  Cardiovascular:     Rate and Rhythm: Normal rate and regular rhythm.     Heart sounds: No murmur heard. Pulmonary:     Effort: Pulmonary effort is normal. No respiratory distress.  Abdominal:     Palpations: Abdomen is soft.     Comments: J tube  Musculoskeletal:        General: Normal range of motion.     Cervical back: Normal range of motion and neck supple.  Skin:    General: Skin is warm and dry.     Coloration: Skin is pale.     Findings: No erythema.  Neurological:     Mental Status: She is alert and oriented to person, place, and time. Mental status is at baseline.     Cranial Nerves: No cranial nerve deficit.     Motor: No abnormal muscle tone.     Coordination: Coordination normal.  Psychiatric:        Mood and Affect: Mood and affect normal.           Latest Ref Rng & Units 12/30/2022   11:49 AM  CMP  Glucose 70 - 99 mg/dL 57    BUN 6 - 20 mg/dL 29   Creatinine 8.46 - 1.00 mg/dL 9.62   Sodium 952 - 841 mmol/L 139   Potassium 3.5 - 5.1 mmol/L 4.1   Chloride 98 - 111 mmol/L 108   CO2 22 - 32 mmol/L 24   Calcium 8.9 - 10.3 mg/dL 8.7   Total Protein 6.5 - 8.1 g/dL 6.9   Total Bilirubin 0.3 - 1.2 mg/dL 0.3   Alkaline Phos 38 - 126 U/L 54   AST 15 - 41 U/L 24   ALT 0 - 44 U/L 12       Latest Ref Rng & Units 12/30/2022   11:49 AM  CBC  WBC 4.0 - 10.5 K/uL 3.7   Hemoglobin 12.0 - 15.0 g/dL 7.6   Hematocrit 32.4 - 46.0 % 22.4   Platelets 150 - 400 K/uL 146     RADIOGRAPHIC STUDIES: I have personally reviewed the radiological images as listed and agreed with the findings in the report. IR BONE MARROW BIOPSY & ASPIRATION  Result Date: 12/16/2022 INDICATION: 32 year old female referred for bone marrow biopsy EXAM: IR BONE MARRO BIOPY AND ASPIRATION MEDICATIONS: None. ANESTHESIA/SEDATION: Moderate (conscious) sedation was employed during this procedure. A total of Versed 2.0 mg and Fentanyl 100 mcg was administered intravenously. Moderate Sedation Time: 11 minutes. The patient's level of consciousness and vital signs were monitored continuously by radiology nursing throughout the procedure under  my direct supervision. FLUOROSCOPY TIME:  Fluoroscopy Time:  (1 mGy). COMPLICATIONS: None PROCEDURE: Informed written consent was obtained from the patient after a thorough discussion of the procedural risks, benefits and alternatives. All questions were addressed. Maximal Sterile Barrier Technique was utilized including caps, mask, sterile gowns, sterile gloves, sterile drape, hand hygiene and skin antiseptic. A timeout was performed prior to the initiation of the procedure. The procedure risks, benefits, and alternatives were explained to the patient. Questions regarding the procedure were encouraged and answered. The patient understands and consents to the procedure. Patient was positioned prone under the image intensifier. Physical  exam was used to determine the L5- S1 level, and then the posterior pelvis was prepped with Chlorhexidine in a sterile fashion. A sterile drape was applied covering the operative field. Sterile gown/sterile gloves were used for the procedure. Local anesthesia was provided with 1% Lidocaine. Posterior right iliac bone was targeted for biopsy. The skin and subcutaneous tissues were infiltrated with 1% lidocaine without epinephrine. A small stab incision was made with an 11 blade scalpel, and an 11 gauge Murphy needle was advanced with fluoro guidance to the posterior cortex. Manual forced was used to advance the needle through the posterior cortex and the stylet was removed. A bone marrow aspirate was retrieved and transported to pathology. The Murphy needle was then advanced without the stylet for a core biopsy. The core biopsy was retrieved and also transported to pathology. Manual pressure was used for hemostasis and a sterile dressing was placed. No complications were encountered no significant blood loss was encountered. Patient tolerated the procedure well and remained hemodynamically stable throughout. IMPRESSION: Status post image guided bone marrow biopsy. Signed, Yvone Neu. Miachel Roux, RPVI [facility] Electronically Signed   By: Gilmer Mor D.O.   On: 12/16/2022 11:03

## 2022-12-30 NOTE — Assessment & Plan Note (Signed)
Follow-up with endocrinologist for diabetes control.  Antiemetics as needed.

## 2022-12-30 NOTE — Assessment & Plan Note (Signed)
History of DVT.  Currently off anticoagulation.

## 2022-12-30 NOTE — Assessment & Plan Note (Addendum)
Patient was hypotensive in the clinic.  Likely due to poor oral intake from gastroparesis. Recommend patient go to emergency room for further evaluation and management.  She declined at first.  Then patient felt lightheaded, about to pass out.  Advised her to go to ER and patient agrees

## 2022-12-30 NOTE — Assessment & Plan Note (Signed)
Continue tube feeding and follow-up with nutritionist.

## 2022-12-30 NOTE — Progress Notes (Signed)
Prior to exiting clinic, pt felt like she was going to pass out so wheelchair was offered to her. BP checked and was 75/53. MD informed and states she recommended ER but pt declined. Advised pt about Dr. Bethanne Ginger recommendation again, but she declined at first. Reassessed a few minutes later and pt accepted to go to ER. Pt taken to ER via w/c.

## 2022-12-30 NOTE — ED Provider Notes (Signed)
[facility] Provider Note    Event Date/Time   First MD Initiated Contact with Patient 12/30/22 1148     (approximate)   History   Hypotension   HPI Ashley Camacho is a 32 y.o. female with DM1, gastroparesis s/p J-tube, adrenal insufficiency, anemia who presents today for hypotension.  Patient was seeing her hematologist for evaluation of her anemia outpatient this morning when she was found to have low blood pressures with systolic blood pressures in the 80s.  She had noted she felt lightheaded when standing on multiple instances.  When leaving the clinic she was still hypotensive and was brought to the ED for further evaluation.  She notes sometimes feeling this way when her blood pressure is not under control.  She feels that has been highly fluctuating this past week.  She also notes poor nutrition over the past several days.  She has required blood transfusions for anemia in the past most recently in July.  She otherwise denies fever, chills, cough, congestion, shortness of breath, chest pain, abdominal pain, vomiting, melena.     Physical Exam   Triage Vital Signs: ED Triage Vitals  Encounter Vitals Group     BP 12/30/22 1145 (!) 83/62     Systolic BP Percentile --      Diastolic BP Percentile --      Pulse Rate 12/30/22 1145 91     Resp 12/30/22 1145 17     Temp 12/30/22 1145 97.7 F (36.5 C)     Temp Source 12/30/22 1145 Oral     SpO2 12/30/22 1145 100 %     Weight 12/30/22 1143 98 lb (44.5 kg)     Height 12/30/22 1143 5\' 1"  (1.549 m)     Head Circumference --      Peak Flow --      Pain Score 12/30/22 1143 0     Pain Loc --      Pain Education --      Exclude from Growth Chart --     Most recent vital signs: Vitals:   12/30/22 1330 12/30/22 1430  BP: 114/66 (!) 100/54  Pulse: 81 82  Resp: 16 12  Temp:    SpO2:      Physical Exam: I have reviewed the vital signs and nursing notes. General: Awake, alert, no acute distress.   Fatigued appearing. Head:  Atraumatic, normocephalic.   ENT:  EOM intact, PERRL. Oral mucosa is pink and moist with no lesions. Neck: Neck is supple with full range of motion, No meningeal signs. Cardiovascular:  RRR, No murmurs. Peripheral pulses palpable and equal bilaterally. Respiratory:  Symmetrical chest wall expansion.  No rhonchi, rales, or wheezes.  Good air movement throughout.  No use of accessory muscles.   Musculoskeletal:  No cyanosis or edema. Moving extremities with full ROM Abdomen:  Soft, nontender, nondistended. Neuro:  GCS 15, moving all four extremities, interacting appropriately. Speech clear. Psych:  Calm, appropriate.   Skin: Pale   ED Results / Procedures / Treatments   Labs (all labs ordered are listed, but only abnormal results are displayed) Labs Reviewed  CBC - Abnormal; Notable for the following components:      Result Value   WBC 3.7 (*)    RBC 2.54 (*)    Hemoglobin 7.6 (*)    HCT 22.4 (*)    Platelets 146 (*)    All other components within normal limits  COMPREHENSIVE METABOLIC PANEL - Abnormal; Notable for the  following components:   Glucose, Bld 57 (*)    BUN 29 (*)    Creatinine, Ser 1.69 (*)    Calcium 8.7 (*)    GFR, Estimated 41 (*)    All other components within normal limits  CBG MONITORING, ED  TYPE AND SCREEN     EKG    RADIOLOGY   PROCEDURES:  Critical Care performed: No  Procedures   MEDICATIONS ORDERED IN ED: Medications  sodium chloride 0.9 % bolus 500 mL (0 mLs Intravenous Stopped 12/30/22 1307)  sodium chloride 0.9 % bolus 1,000 mL (0 mLs Intravenous Stopped 12/30/22 1441)     IMPRESSION / MDM / ASSESSMENT AND PLAN / ED COURSE  I reviewed the triage vital signs and the nursing notes.                              Differential diagnosis includes, but is not limited to, anemia of chronic disease, dehydration, poor nutritional intake, brittle type 1 diabetes.  Patient's presentation is most consistent with  severe exacerbation of chronic illness.  Presented with hypotension and lightheadedness. No tachycardia or infectious symptoms to suspect this is secondary to infection. Patient has had similar presentations in the past related to dehydration and poor nutrition. She is frail and may have more labile blood pressure at baseline. She was given 1.5L of fluid with improvement in symptoms and hypotension. Lab results show anemia of 7.6 down from 9.2, however, patient has no obvious signs of bleeding. No melena, hematochezia, hemetemesis, bruising present. Already being evaluated outpatient by hematology for source of anemia. Do not think she needs transfusion at this time with no hypoxia, tachypnea,  or tachycardia and improving hypotension. She will be discharged with follow up with hematology in a few days for recheck of Hgb and continued care. Given strict return precautions for worsening symptoms or hypotension.   Clinical Course as of 12/30/22 1726  Thu Dec 30, 2022  1207 EF on most recent echo was 50 to 55%.  Will titrate fluids appropriately for hypotension. [DW]  1246 CBC(!) Hemoglobin mildly low at 7.6, down from most recent value. Mild leukopenia with no obvious infectious source [DW]  1258 Comprehensive metabolic panel(!) Mild hypoglycemia. Will replenish orally. Creatinine comparable to prior levels [DW]  1433 Reassessed patient.  Feeling better at this time.  Lightheadedness has improved.  Blood pressure also improved after fluids.  Plan for discharge and follow-up with her hematologist for repeat hemoglobin check within the next couple days. [DW]    Clinical Course User Index [DW] Janith Lima, MD     FINAL CLINICAL IMPRESSION(S) / ED DIAGNOSES   Final diagnoses:  Dehydration  Hypotension due to hypovolemia     Rx / DC Orders   ED Discharge Orders     None        Note:  This document was prepared using Dragon voice recognition software and may include unintentional  dictation errors.   Janith Lima, MD 12/30/22 [phone]

## 2023-01-04 DIAGNOSIS — K3184 Gastroparesis: Secondary | ICD-10-CM | POA: Diagnosis not present

## 2023-01-04 DIAGNOSIS — E109 Type 1 diabetes mellitus without complications: Secondary | ICD-10-CM | POA: Diagnosis not present

## 2023-01-04 DIAGNOSIS — E440 Moderate protein-calorie malnutrition: Secondary | ICD-10-CM | POA: Diagnosis not present

## 2023-01-04 DIAGNOSIS — N1832 Chronic kidney disease, stage 3b: Secondary | ICD-10-CM | POA: Diagnosis not present

## 2023-01-04 DIAGNOSIS — Z931 Gastrostomy status: Secondary | ICD-10-CM | POA: Diagnosis not present

## 2023-01-12 ENCOUNTER — Ambulatory Visit: Payer: Medicaid Other | Admitting: Dietician

## 2023-01-14 ENCOUNTER — Encounter
Admission: RE | Admit: 2023-01-14 | Discharge: 2023-01-14 | Disposition: A | Discharge status: Home / Self Care | Payer: Medicaid Other | Source: Ambulatory Visit | Attending: Gastroenterology | Admitting: Gastroenterology

## 2023-01-14 DIAGNOSIS — E119 Type 2 diabetes mellitus without complications: Secondary | ICD-10-CM | POA: Diagnosis not present

## 2023-01-14 DIAGNOSIS — E1143 Type 2 diabetes mellitus with diabetic autonomic (poly)neuropathy: Secondary | ICD-10-CM | POA: Insufficient documentation

## 2023-01-14 DIAGNOSIS — K3184 Gastroparesis: Secondary | ICD-10-CM | POA: Diagnosis not present

## 2023-01-14 MED ORDER — TECHNETIUM TC 99M SULFUR COLLOID
2.0000 | Freq: Once | INTRAVENOUS | Status: AC | PRN
  Administered 2023-01-14: 1.94 via ORAL

## 2023-01-23 DIAGNOSIS — E440 Moderate protein-calorie malnutrition: Secondary | ICD-10-CM | POA: Diagnosis not present

## 2023-01-23 DIAGNOSIS — Z419 Encounter for procedure for purposes other than remedying health state, unspecified: Secondary | ICD-10-CM | POA: Diagnosis not present

## 2023-01-23 DIAGNOSIS — N1832 Chronic kidney disease, stage 3b: Secondary | ICD-10-CM | POA: Diagnosis not present

## 2023-01-23 DIAGNOSIS — E109 Type 1 diabetes mellitus without complications: Secondary | ICD-10-CM | POA: Diagnosis not present

## 2023-01-23 DIAGNOSIS — K3184 Gastroparesis: Secondary | ICD-10-CM | POA: Diagnosis not present

## 2023-01-23 DIAGNOSIS — Z931 Gastrostomy status: Secondary | ICD-10-CM | POA: Diagnosis not present

## 2023-02-04 DIAGNOSIS — N1832 Chronic kidney disease, stage 3b: Secondary | ICD-10-CM | POA: Diagnosis not present

## 2023-02-04 DIAGNOSIS — E440 Moderate protein-calorie malnutrition: Secondary | ICD-10-CM | POA: Diagnosis not present

## 2023-02-04 DIAGNOSIS — E109 Type 1 diabetes mellitus without complications: Secondary | ICD-10-CM | POA: Diagnosis not present

## 2023-02-04 DIAGNOSIS — K3184 Gastroparesis: Secondary | ICD-10-CM | POA: Diagnosis not present

## 2023-02-04 DIAGNOSIS — Z931 Gastrostomy status: Secondary | ICD-10-CM | POA: Diagnosis not present

## 2023-02-15 ENCOUNTER — Other Ambulatory Visit: Payer: Self-pay

## 2023-02-15 ENCOUNTER — Inpatient Hospital Stay
Admission: EM | Admit: 2023-02-15 | Discharge: 2023-02-18 | DRG: 073 | Disposition: A | Discharge status: Home / Self Care | Payer: Medicaid Other | Attending: Internal Medicine | Admitting: Internal Medicine

## 2023-02-15 DIAGNOSIS — D631 Anemia in chronic kidney disease: Secondary | ICD-10-CM | POA: Diagnosis present

## 2023-02-15 DIAGNOSIS — Z79890 Hormone replacement therapy (postmenopausal): Secondary | ICD-10-CM

## 2023-02-15 DIAGNOSIS — E1022 Type 1 diabetes mellitus with diabetic chronic kidney disease: Secondary | ICD-10-CM | POA: Diagnosis present

## 2023-02-15 DIAGNOSIS — Z79899 Other long term (current) drug therapy: Secondary | ICD-10-CM

## 2023-02-15 DIAGNOSIS — E860 Dehydration: Secondary | ICD-10-CM | POA: Diagnosis not present

## 2023-02-15 DIAGNOSIS — E43 Unspecified severe protein-calorie malnutrition: Secondary | ICD-10-CM | POA: Diagnosis present

## 2023-02-15 DIAGNOSIS — Z8249 Family history of ischemic heart disease and other diseases of the circulatory system: Secondary | ICD-10-CM

## 2023-02-15 DIAGNOSIS — E46 Unspecified protein-calorie malnutrition: Secondary | ICD-10-CM | POA: Diagnosis present

## 2023-02-15 DIAGNOSIS — K3184 Gastroparesis: Secondary | ICD-10-CM | POA: Diagnosis present

## 2023-02-15 DIAGNOSIS — Z91199 Patient's noncompliance with other medical treatment and regimen due to unspecified reason: Secondary | ICD-10-CM

## 2023-02-15 DIAGNOSIS — N179 Acute kidney failure, unspecified: Secondary | ICD-10-CM | POA: Diagnosis not present

## 2023-02-15 DIAGNOSIS — E109 Type 1 diabetes mellitus without complications: Secondary | ICD-10-CM | POA: Diagnosis present

## 2023-02-15 DIAGNOSIS — R112 Nausea with vomiting, unspecified: Secondary | ICD-10-CM | POA: Diagnosis present

## 2023-02-15 DIAGNOSIS — E2740 Unspecified adrenocortical insufficiency: Secondary | ICD-10-CM | POA: Diagnosis present

## 2023-02-15 DIAGNOSIS — Z681 Body mass index (BMI) 19 or less, adult: Secondary | ICD-10-CM

## 2023-02-15 DIAGNOSIS — D61818 Other pancytopenia: Secondary | ICD-10-CM | POA: Diagnosis present

## 2023-02-15 DIAGNOSIS — D638 Anemia in other chronic diseases classified elsewhere: Secondary | ICD-10-CM | POA: Diagnosis not present

## 2023-02-15 DIAGNOSIS — N189 Chronic kidney disease, unspecified: Secondary | ICD-10-CM | POA: Diagnosis present

## 2023-02-15 DIAGNOSIS — I5042 Chronic combined systolic (congestive) and diastolic (congestive) heart failure: Secondary | ICD-10-CM

## 2023-02-15 DIAGNOSIS — N1832 Chronic kidney disease, stage 3b: Secondary | ICD-10-CM | POA: Diagnosis present

## 2023-02-15 DIAGNOSIS — E861 Hypovolemia: Secondary | ICD-10-CM | POA: Diagnosis not present

## 2023-02-15 DIAGNOSIS — Z803 Family history of malignant neoplasm of breast: Secondary | ICD-10-CM

## 2023-02-15 DIAGNOSIS — R42 Dizziness and giddiness: Secondary | ICD-10-CM | POA: Diagnosis present

## 2023-02-15 DIAGNOSIS — Z833 Family history of diabetes mellitus: Secondary | ICD-10-CM

## 2023-02-15 DIAGNOSIS — I509 Heart failure, unspecified: Secondary | ICD-10-CM

## 2023-02-15 DIAGNOSIS — E1043 Type 1 diabetes mellitus with diabetic autonomic (poly)neuropathy: Principal | ICD-10-CM | POA: Diagnosis present

## 2023-02-15 DIAGNOSIS — R197 Diarrhea, unspecified: Secondary | ICD-10-CM | POA: Diagnosis present

## 2023-02-15 DIAGNOSIS — Z801 Family history of malignant neoplasm of trachea, bronchus and lung: Secondary | ICD-10-CM

## 2023-02-15 DIAGNOSIS — Z23 Encounter for immunization: Secondary | ICD-10-CM

## 2023-02-15 DIAGNOSIS — I5032 Chronic diastolic (congestive) heart failure: Secondary | ICD-10-CM | POA: Diagnosis present

## 2023-02-15 DIAGNOSIS — I130 Hypertensive heart and chronic kidney disease with heart failure and stage 1 through stage 4 chronic kidney disease, or unspecified chronic kidney disease: Secondary | ICD-10-CM | POA: Diagnosis present

## 2023-02-15 DIAGNOSIS — E1065 Type 1 diabetes mellitus with hyperglycemia: Secondary | ICD-10-CM | POA: Diagnosis present

## 2023-02-15 DIAGNOSIS — Z934 Other artificial openings of gastrointestinal tract status: Secondary | ICD-10-CM

## 2023-02-15 DIAGNOSIS — Z794 Long term (current) use of insulin: Secondary | ICD-10-CM

## 2023-02-15 DIAGNOSIS — I959 Hypotension, unspecified: Secondary | ICD-10-CM | POA: Diagnosis present

## 2023-02-15 LAB — CBC
HCT: 24.3 % — ABNORMAL LOW (ref 36.0–46.0)
Hemoglobin: 8.7 g/dL — ABNORMAL LOW (ref 12.0–15.0)
MCH: 29.7 pg (ref 26.0–34.0)
MCHC: 35.8 g/dL (ref 30.0–36.0)
MCV: 82.9 fL (ref 80.0–100.0)
Platelets: 172 10*3/uL (ref 150–400)
RBC: 2.93 MIL/uL — ABNORMAL LOW (ref 3.87–5.11)
RDW: 11.9 % (ref 11.5–15.5)
WBC: 4.1 10*3/uL (ref 4.0–10.5)
nRBC: 0 % (ref 0.0–0.2)

## 2023-02-15 LAB — COMPREHENSIVE METABOLIC PANEL
ALT: 19 U/L (ref 0–44)
AST: 28 U/L (ref 15–41)
Albumin: 4.5 g/dL (ref 3.5–5.0)
Alkaline Phosphatase: 80 U/L (ref 38–126)
Anion gap: 11 (ref 5–15)
BUN: 45 mg/dL — ABNORMAL HIGH (ref 6–20)
CO2: 25 mmol/L (ref 22–32)
Calcium: 9.5 mg/dL (ref 8.9–10.3)
Chloride: 99 mmol/L (ref 98–111)
Creatinine, Ser: 2.05 mg/dL — ABNORMAL HIGH (ref 0.44–1.00)
GFR, Estimated: 33 mL/min — ABNORMAL LOW (ref >60)
Glucose, Bld: 180 mg/dL — ABNORMAL HIGH (ref 70–99)
Potassium: 4.3 mmol/L (ref 3.5–5.1)
Sodium: 135 mmol/L (ref 135–145)
Total Bilirubin: 0.7 mg/dL (ref 0.3–1.2)
Total Protein: 7.4 g/dL (ref 6.5–8.1)

## 2023-02-15 LAB — TYPE AND SCREEN
ABO/RH(D): A POS
Antibody Screen: NEGATIVE

## 2023-02-15 LAB — MAGNESIUM: Magnesium: 2.3 mg/dL (ref 1.7–2.4)

## 2023-02-15 LAB — TROPONIN I (HIGH SENSITIVITY): Troponin I (High Sensitivity): <2 ng/L (ref <18)

## 2023-02-15 LAB — LIPASE, BLOOD: Lipase: 37 U/L (ref 11–51)

## 2023-02-15 LAB — PHOSPHORUS: Phosphorus: 3 mg/dL (ref 2.5–4.6)

## 2023-02-15 LAB — CBG MONITORING, ED: Glucose-Capillary: 162 mg/dL — ABNORMAL HIGH (ref 70–99)

## 2023-02-15 MED ORDER — SODIUM CHLORIDE 0.9 % IV BOLUS
1000.0000 mL | Freq: Once | INTRAVENOUS | Status: AC
  Administered 2023-02-15: 1000 mL via INTRAVENOUS

## 2023-02-15 MED ORDER — ZINC SULFATE 220 (50 ZN) MG PO CAPS: 220.0000 mg | ORAL_CAPSULE | Freq: Every day | ORAL | Status: DC

## 2023-02-15 MED ORDER — INSULIN GLARGINE-YFGN 100 UNIT/ML ~~LOC~~ SOLN
5.0000 [IU] | Freq: Two times a day (BID) | SUBCUTANEOUS | Status: DC
  Administered 2023-02-16 – 2023-02-17 (×4): 5 [IU] via SUBCUTANEOUS
  Filled 2023-02-15 (×4): qty 0.05

## 2023-02-15 MED ORDER — ACETAMINOPHEN 650 MG RE SUPP: 650.0000 mg | Freq: Four times a day (QID) | RECTAL | Status: DC | PRN

## 2023-02-15 MED ORDER — DIVALPROEX SODIUM ER 250 MG PO TB24
250.0000 mg | ORAL_TABLET | Freq: Every day | ORAL | Status: DC
  Administered 2023-02-16 – 2023-02-18 (×4): 250 mg via ORAL
  Filled 2023-02-15 (×4): qty 1

## 2023-02-15 MED ORDER — INSULIN ASPART 100 UNIT/ML IJ SOLN: 0.0000 [IU] | Freq: Three times a day (TID) | INTRAMUSCULAR | Status: DC

## 2023-02-15 MED ORDER — LEVOTHYROXINE SODIUM 50 MCG PO TABS: 50.0000 ug | ORAL_TABLET | Freq: Every day | ORAL | Status: DC

## 2023-02-15 MED ORDER — ENOXAPARIN SODIUM 40 MG/0.4ML IJ SOSY: 40.0000 mg | PREFILLED_SYRINGE | INTRAMUSCULAR | Status: DC

## 2023-02-15 MED ORDER — SODIUM CHLORIDE 0.9% FLUSH
3.0000 mL | Freq: Two times a day (BID) | INTRAVENOUS | Status: DC
  Administered 2023-02-17 – 2023-02-18 (×2): 3 mL via INTRAVENOUS

## 2023-02-15 MED ORDER — ACETAMINOPHEN 325 MG PO TABS: 650.0000 mg | ORAL_TABLET | Freq: Four times a day (QID) | ORAL | Status: DC | PRN

## 2023-02-15 MED ORDER — FLUOXETINE HCL 10 MG PO CAPS: 10.0000 mg | ORAL_CAPSULE | Freq: Every day | ORAL | Status: DC

## 2023-02-15 MED ORDER — ONDANSETRON HCL 4 MG/2ML IJ SOLN
4.0000 mg | Freq: Four times a day (QID) | INTRAMUSCULAR | Status: DC | PRN
  Administered 2023-02-16: 4 mg via INTRAVENOUS
  Filled 2023-02-15 (×2): qty 2

## 2023-02-15 MED ORDER — METOCLOPRAMIDE HCL 5 MG/ML IJ SOLN
10.0000 mg | Freq: Once | INTRAMUSCULAR | Status: DC
  Filled 2023-02-15: qty 2

## 2023-02-15 MED ORDER — HYDROCORTISONE 10 MG PO TABS
20.0000 mg | ORAL_TABLET | Freq: Every day | ORAL | Status: DC
  Filled 2023-02-15: qty 2

## 2023-02-15 MED ORDER — GLUCERNA 1.5 CAL PO LIQD
1000.0000 mL | Freq: Every day | ORAL | Status: DC
  Administered 2023-02-16: 1000 mL

## 2023-02-15 MED ORDER — SODIUM CHLORIDE 0.9 % IV SOLN
12.5000 mg | Freq: Four times a day (QID) | INTRAVENOUS | Status: DC | PRN
  Administered 2023-02-16 (×2): 12.5 mg via INTRAVENOUS
  Filled 2023-02-15 (×2): qty 12.5

## 2023-02-15 MED ORDER — ENOXAPARIN SODIUM 30 MG/0.3ML IJ SOSY
30.0000 mg | PREFILLED_SYRINGE | INTRAMUSCULAR | Status: DC
  Administered 2023-02-16 – 2023-02-18 (×3): 30 mg via SUBCUTANEOUS
  Filled 2023-02-15 (×3): qty 0.3

## 2023-02-15 MED ORDER — HYDROCORTISONE 10 MG PO TABS
10.0000 mg | ORAL_TABLET | Freq: Every day | ORAL | Status: DC
  Filled 2023-02-15: qty 1

## 2023-02-15 MED ORDER — LACTATED RINGERS IV SOLN: INTRAVENOUS | Status: DC

## 2023-02-15 MED ORDER — INSULIN ASPART 100 UNIT/ML IJ SOLN
2.0000 [IU] | Freq: Three times a day (TID) | INTRAMUSCULAR | Status: DC
  Administered 2023-02-16 – 2023-02-17 (×3): 2 [IU] via SUBCUTANEOUS
  Filled 2023-02-15 (×3): qty 1

## 2023-02-15 NOTE — ED Provider Notes (Signed)
[facility] Provider Note    Event Date/Time   First MD Initiated Contact with Patient 02/15/23 2008     (approximate)   History   Abdominal Pain, Hyperglycemia, and Dizziness   HPI  Ashley Camacho is a 32 y.o. female with a history of type 1 diabetes, anemia, adrenal insufficiency, gastroparesis status post feeding tube placement presents to the ER for evaluation of several days of intractable nausea vomiting.  Feeling very weak and dehydrated.  Denies any abdominal pain at this time or back pain.  No fevers or chills.     Physical Exam   Triage Vital Signs: ED Triage Vitals  Encounter Vitals Group     BP 02/15/23 2004 (!) 62/45     Systolic BP Percentile --      Diastolic BP Percentile --      Pulse Rate 02/15/23 2004 (!) 101     Resp 02/15/23 2004 (!) 21     Temp --      Temp Source 02/15/23 2004 Oral     SpO2 02/15/23 2004 93 %     Weight 02/15/23 2002 92 lb (41.7 kg)     Height 02/15/23 2002 5\' 1"  (1.549 m)     Head Circumference --      Peak Flow --      Pain Score --      Pain Loc --      Pain Education --      Exclude from Growth Chart --     Most recent vital signs: Vitals:   02/15/23 2300 02/15/23 2302  BP: 129/70   Pulse: 77   Resp: 19   Temp: 97.6 F (36.4 C)   SpO2: 100% 100%     Constitutional: Alert  Eyes: Conjunctivae are normal.  Head: Atraumatic. Nose: No congestion/rhinnorhea. Mouth/Throat: Mucous membranes are moist.   Neck: Painless ROM.  Cardiovascular:   Good peripheral circulation. Respiratory: Normal respiratory effort.  No retractions.  Gastrointestinal: Soft and nontender.  Musculoskeletal:  no deformity Neurologic:  MAE spontaneously. No gross focal neurologic deficits are appreciated.  Skin:  Skin is warm, dry and intact. No rash noted. Psychiatric: Mood and affect are normal. Speech and behavior are normal.    ED Results / Procedures / Treatments   Labs (all labs ordered are listed, but  only abnormal results are displayed) Labs Reviewed  COMPREHENSIVE METABOLIC PANEL - Abnormal; Notable for the following components:      Result Value   Glucose, Bld 180 (*)    BUN 45 (*)    Creatinine, Ser 2.05 (*)    GFR, Estimated 33 (*)    All other components within normal limits  CBC - Abnormal; Notable for the following components:   RBC 2.93 (*)    Hemoglobin 8.7 (*)    HCT 24.3 (*)    All other components within normal limits  CBG MONITORING, ED - Abnormal; Notable for the following components:   Glucose-Capillary 162 (*)    All other components within normal limits  LIPASE, BLOOD  MAGNESIUM  PHOSPHORUS  POC URINE PREG, ED  TYPE AND SCREEN  TROPONIN I (HIGH SENSITIVITY)     EKG  ED ECG REPORT I, Willy Eddy, the attending physician, personally viewed and interpreted this ECG.   Date: 02/15/2023  EKG Time: 20:10  Rate: 90  Rhythm: sinus  Axis: normal  Intervals: normal  ST&T Change: diffuse t wave inversions    RADIOLOGY    PROCEDURES:  Critical Care performed: No  Procedures   MEDICATIONS ORDERED IN ED: Medications  metoCLOPramide (REGLAN) injection 10 mg (has no administration in time range)  lactated ringers infusion (has no administration in time range)  insulin glargine-yfgn (SEMGLEE) injection 5 Units (has no administration in time range)  insulin aspart (novoLOG) injection 2 Units (has no administration in time range)  sodium chloride 0.9 % bolus 1,000 mL (0 mLs Intravenous Stopped 02/15/23 2141)  sodium chloride 0.9 % bolus 1,000 mL (0 mLs Intravenous Stopped 02/15/23 2243)     IMPRESSION / MDM / ASSESSMENT AND PLAN / ED COURSE  I reviewed the triage vital signs and the nursing notes.                              Differential diagnosis includes, but is not limited to, dehydration, anemia, electrolyte abnormality, sepsis, gastroparesis  Patient presenting to the ER for evaluation of symptoms as described above.  Based on symptoms,  risk factors and considered above differential, this presenting complaint could reflect a potentially life-threatening illness therefore the patient will be placed on continuous pulse oximetry and telemetry for monitoring.  Laboratory evaluation will be sent to evaluate for the above complaints.  EKG with diffuse T wave inversions likely secondary to significant hypotension which is being corrected with IV fluids.   Clinical Course as of 02/15/23 2311  Tue Feb 15, 2023  2030 BP(!): 62/45 [HD]  2114 Hemoglobin(!): 8.7 [HD]  2114 CBC(!) [HD]  2115 BP significantly improving after IV hydration.  No leukocytosis. [PR]  2124 Patient with pretty significant AKI.  No acidosis.  Anemia baseline.  Troponin negative.  Awaiting urinalysis. [PR]    Clinical Course User Index [HD] Lynda Rainwater, Student-PA [PR] Willy Eddy, MD     FINAL CLINICAL IMPRESSION(S) / ED DIAGNOSES   Final diagnoses:  Dehydration     Rx / DC Orders   ED Discharge Orders     None        Note:  This document was prepared using Dragon voice recognition software and may include unintentional dictation errors.    Willy Eddy, MD 02/15/23 2312

## 2023-02-15 NOTE — Assessment & Plan Note (Signed)
Per chart review, patient has a history of type 1 diabetes with labile blood sugars.On most recent admission, recurrent hypoglycemia led to patient receiving 2 units with meals and 5 units of Semglee twice daily.  Will continue that regimen at this time.  No evidence of DKA at this time with normal bicarb and anion gap.  - Scheduled meal coverage of 2 units - Semglee 5 units twice daily - Close CBG monitoring

## 2023-02-15 NOTE — Assessment & Plan Note (Addendum)
History of severe malnutrition, currently pre-scribed nightly tube feeds via J-tube.  Unfortunately, patient has not been able to utilize consistent nighttime tube feeds due to social barriers.  She has limited support from her ex-husband with whom she still lives in her mother does not assist with her medical needs.  In addition, her 32-year-old is frequently awoken by the tube feeds and frequently tries to grab at the tube.  - Resume nighttime tube feeds - Dietitian consulted

## 2023-02-15 NOTE — H&P (Signed)
History and Physical    Patient: Ashley Camacho QMV:[id number] DOB: 1991/02/20 DOA: 02/15/2023 DOS: the patient was seen and examined on 02/15/2023 PCP: Pcp, No  Patient coming from: Home  Chief Complaint:  Chief Complaint  Patient presents with   Abdominal Pain   Hyperglycemia   Dizziness   HPI: Ashley Camacho is a 32 y.o. female with medical history significant of type 1 diabetes, adrenal insufficiency, gastroparesis, protein malnutrition s/p J-tube placement on nighttime tube feeds, normocytic anemia, CKD stage IIIb who presents to the ED due to nausea and vomiting.  Ashley Camacho states that she has been experiencing nausea and vomiting that has been intractable for approximately 1 week now.  She has not been experiencing any abdominal pain.  Due to poor p.o. intake, she began to experience dizziness that particularly occurred when she would change positions.  She endorses feeling generally weak and dehydrated.  She denies any other symptoms at this time.  She notes that this feels like her typical flare.  Ashley Camacho states that she has not been able to utilize her nighttime tube feeds.  She notes that her 52-year-old is very rambunctious and frequently grabs at her J-tube anytime she tries to use it.  In addition, her toddler sleeps with her and when she turns on the machine at night, wakes up her toddler.  The longest she can do tube feeds at home is 4 hours and she has only been able to do this twice in the last week.  She notes that she does not have much support from her ex-husband, with whom she still resides, nor her mother who lives nearby.  This has been the biggest barrier for her.  ED Course:  On arrival to the ED, patient was initially quite hypotensive but improved to 98/59 with IV fluids.  She was afebrile at 97.7.  Heart rate ranged between 74-79. Initial workup notable for hemoglobin of 8.7, glucose of 180, BUN 45, creatinine 2.05 with GFR of 33, bicarb of 25 and anion gap of  11.  Troponin negative.  Due to intractable nausea/vomiting with AKI, [facility] contacted for admission.  Review of Systems: As mentioned in the history of present illness. All other systems reviewed and are negative.  Past Medical History:  Diagnosis Date   Acute metabolic encephalopathy 06/10/2022   Adrenal insufficiency (HCC)    Anemia    CHF (congestive heart failure) (HCC)    Gastroparesis    Hypertension    Type 1 diabetes (HCC)    Past Surgical History:  Procedure Laterality Date   BIOPSY  01/14/2021   Procedure: BIOPSY;  Surgeon: Tressia Danas, MD;  Location: [facility] ENDOSCOPY;  Service: Gastroenterology;;   CESAREAN SECTION     CESAREAN SECTION WITH BILATERAL TUBAL LIGATION N/A 02/17/2021   Procedure: CESAREAN SECTION WITH BILATERAL TUBAL LIGATION;  Surgeon: [name], DO;  Location: [facility] LD ORS;  Service: Obstetrics;  Laterality: N/A;   ESOPHAGOGASTRODUODENOSCOPY (EGD) WITH PROPOFOL N/A 01/14/2021   Procedure: ESOPHAGOGASTRODUODENOSCOPY (EGD) WITH PROPOFOL;  Surgeon: Tressia Danas, MD;  Location: [facility] ENDOSCOPY;  Service: Gastroenterology;  Laterality: N/A;   IR BONE MARROW BIOPSY & ASPIRATION  12/16/2022   IR REPLC DUODEN/JEJUNO TUBE PERCUT W/FLUORO  11/22/2022   JEJUNOSTOMY N/A 06/13/2022   Procedure: Rance Muir;  Surgeon: Leafy Ro, MD;  Location: [facility] ORS;  Service: General;  Laterality: N/A;   MOUTH SURGERY     Social History:  reports that she has never smoked. She has never been exposed  to tobacco smoke. She has never used smokeless tobacco. She reports that she does not drink alcohol and does not use drugs.  No Known Allergies  Family History  Problem Relation Age of Onset   Breast cancer Mother    Seizures Mother    Diabetes type I Father    CAD Father    Lung cancer Maternal Grandfather    CAD Paternal Grandmother    CAD Paternal Grandfather    Ovarian cancer Neg Hx     Prior to Admission medications   Medication Sig Start Date End Date Taking?  Authorizing Provider  acetaminophen (TYLENOL) 500 MG tablet Take 2 tablets (1,000 mg total) by mouth every 6 (six) hours as needed for headache, fever or moderate pain. 08/05/22   Loyce Dys, MD  amLODipine (NORVASC) 10 MG tablet Take 1 tablet (10 mg total) by mouth daily. 11/16/22 12/30/22  Leeroy Bock, MD  divalproex (DEPAKOTE ER) 250 MG 24 hr tablet Take 1 tablet (250 mg total) by mouth daily. 08/05/22   Loyce Dys, MD  FLUoxetine (PROZAC) 10 MG capsule Take 1 capsule (10 mg total) by mouth daily. 08/05/22 12/30/22  Loyce Dys, MD  hydrocortisone (CORTEF) 10 MG tablet Take 2 tablets (20 mg total) by mouth daily with breakfast AND 1 tablet (10 mg total) daily with supper. 08/05/22   Loyce Dys, MD  insulin aspart (NOVOLOG) 100 UNIT/ML injection Inject 2 Units into the skin 3 (three) times daily with meals. Patient not taking: Reported on 11/22/2022 08/05/22 01/19/23  Loyce Dys, MD  insulin aspart (NOVOLOG) 100 UNIT/ML injection Inject 0-6 Units into the skin 4 (four) times daily - after meals and at bedtime. 11/15/22   Leeroy Bock, MD  Insulin Glargine [facility]) 100 UNIT/ML Inject 5 Units into the skin 2 (two) times daily. 11/15/22 05/22/23  Leeroy Bock, MD  levothyroxine (SYNTHROID) 50 MCG tablet Take 1 tablet (50 mcg total) by mouth daily at 6 (six) AM. 08/05/22 12/30/22  Loyce Dys, MD  loperamide (IMODIUM) 2 MG capsule Take 2 capsules (4 mg total) by mouth as needed for diarrhea or loose stools. 11/15/22   Leeroy Bock, MD  Multiple Vitamin (MULTIVITAMIN WITH MINERALS) TABS tablet Take 1 tablet by mouth daily. 11/16/22   Leeroy Bock, MD  Nutritional Supplements (FEEDING SUPPLEMENT, GLUCERNA 1.5 CAL,) LIQD Place 640 mLs into feeding tube at bedtime. 11/15/22   Leeroy Bock, MD  promethazine (PHENERGAN) 25 MG tablet Take 0.5 tablets (12.5 mg total) by mouth every 6 (six) hours as needed for up to 10 days for nausea or vomiting. 11/28/22  12/30/22  Lucile Shutters, MD  zinc gluconate 50 MG tablet Take 1 tablet (50 mg total) by mouth daily. 11/20/22   Rickard Patience, MD    Physical Exam: Vitals:   02/15/23 2130 02/15/23 2300 02/15/23 2302 02/15/23 2315  BP: (!) 104/58 129/70  137/78  Pulse: 73 77  73  Resp: 14 19  13   Temp: 97.7 F (36.5 C) 97.6 F (36.4 C)    TempSrc: Oral Oral    SpO2: 100% 100% 100% 100%  Weight:      Height:       Physical Exam Vitals and nursing note reviewed.  HENT:     Head: Normocephalic and atraumatic.     Mouth/Throat:     Mouth: Mucous membranes are dry.     Pharynx: Oropharynx is clear.  Eyes:     Conjunctiva/sclera: Conjunctivae  normal.     Pupils: Pupils are equal, round, and reactive to light.  Cardiovascular:     Rate and Rhythm: Normal rate and regular rhythm.     Heart sounds: No murmur heard.    No gallop.  Pulmonary:     Effort: Pulmonary effort is normal. No respiratory distress.     Breath sounds: Normal breath sounds.  Abdominal:     General: Bowel sounds are normal.     Tenderness: There is no abdominal tenderness. There is no guarding.     Comments: J-tube present in the left upper quadrant. Some granulation tissue is present, but no erythema to suggest infection.   Musculoskeletal:     Right lower leg: No edema.     Left lower leg: No edema.  Skin:    General: Skin is warm and dry.     Coloration: Skin is pale.  Neurological:     Mental Status: She is alert and oriented to person, place, and time. Mental status is at baseline.  Psychiatric:        Mood and Affect: Mood normal.        Behavior: Behavior normal.    Data Reviewed: CBC with WBC of 4.1, hemoglobin of 8.7, MCV of 82, platelets of 172 CMP with sodium of 135, potassium 4.3, bicarb 25, BUN 45, creatinine 2.5 GFR 33, glucose 180, bicarb 25, anion gap 11, AST 20, ALT 19  EKG personally reviewed.  Sinus rhythm with rate of 92.  T wave inversion diffusely.  This is new compared to prior EKG. EKG obtained  during hypotension, repeat EKG pending   Results are pending, will review when available.  Assessment and Plan:  * Hypotension Patient presenting with significant hypotension as low as 78/66 in the setting of nausea/vomiting and poor p.o. intake leading to hypovolemic hypotension and dizziness.  Blood pressure has improved significantly with IV fluid resuscitation.  - Continue maintenance IV fluids - Management of nutrition as noted below  Intractable nausea and vomiting Patient has a history of intractable nausea and vomiting with multiple hospitalizations.  History of severe gastroparesis, however interestingly, most recent gastric emptying study on August 23 was normal; unclear if this could be a false negative.  - IV fluids as ordered - Zofran or Phenergan as needed every 6 hours for nausea and vomiting  Acute kidney injury superimposed on chronic kidney disease (HCC) In the setting of nausea/vomiting with poor p.o. intake.  History of CKD stage IIIb With baseline creatinine fluctuating frequently but most recently 1.69.  Currently 2.05.  - Repeat BMP in the a.m. - IV fluids as ordered - Avoid nephrotoxic agents  Type 1 diabetes mellitus without complications (HCC) Per chart review, patient has a history of type 1 diabetes with labile blood sugars.On most recent admission, recurrent hypoglycemia led to patient receiving 2 units with meals and 5 units of Semglee twice daily.  Will continue that regimen at this time.  No evidence of DKA at this time with normal bicarb and anion gap.  - Scheduled meal coverage of 2 units - Semglee 5 units twice daily - Close CBG monitoring  Gastroparesis History of severe gastroparesis diagnosed in 2017, however most recent emptying study was normal.  Follows with GI.  Did not previously respond to Reglan or erythromycin  Severe malnutrition (HCC) History of severe malnutrition, currently pre-scribed nightly tube feeds via J-tube.  Unfortunately,  patient has not been able to utilize consistent nighttime tube feeds due to social barriers.  She has limited support from her ex-husband with whom she still lives in her mother does not assist with her medical needs.  In addition, her 4-year-old is frequently awoken by the tube feeds and frequently tries to grab at the tube.  - Resume nighttime tube feeds - Dietitian consulted  Adrenal insufficiency (HCC) - Continue home Cortef  Anemia of chronic disease Hemoglobin is stable at this time with no acute bleeding.  Congestive heart failure (HCC) Per chart review, patient had a history of HFrEF diagnosed while pregnant with recovered EF now.  At this time, she is hypovolemic.  She is not on any diuretics at home.  - Daily weights  Advance Care Planning:   Code Status: Full Code   Consults: None  Family Communication: No family at bedside.   Severity of Illness: The appropriate patient status for this patient is OBSERVATION. Observation status is judged to be reasonable and necessary in order to provide the required intensity of service to ensure the patient's safety. The patient's presenting symptoms, physical exam findings, and initial radiographic and laboratory data in the context of their medical condition is felt to place them at decreased risk for further clinical deterioration. Furthermore, it is anticipated that the patient will be medically stable for discharge from the hospital within 2 midnights of admission.   Author: Verdene Lennert, MD 02/15/2023 11:40 PM  For on call review [email or web address].

## 2023-02-15 NOTE — Assessment & Plan Note (Signed)
History of severe gastroparesis diagnosed in 2017, however most recent emptying study was normal.  Follows with GI.  Did not previously respond to Reglan or erythromycin

## 2023-02-15 NOTE — Assessment & Plan Note (Addendum)
Patient has a history of intractable nausea and vomiting with multiple hospitalizations.  History of severe gastroparesis, however interestingly, most recent gastric emptying study on August 23 was normal; unclear if this could be a false negative.  - IV fluids as ordered - Zofran or Phenergan as needed every 6 hours for nausea and vomiting

## 2023-02-15 NOTE — Assessment & Plan Note (Addendum)
Patient presenting with significant hypotension as low as 78/66 in the setting of nausea/vomiting and poor p.o. intake leading to hypovolemic hypotension and dizziness.  Blood pressure has improved significantly with IV fluid resuscitation.  - Continue maintenance IV fluids - Management of nutrition as noted below

## 2023-02-15 NOTE — ED Triage Notes (Signed)
Pt reports abd pain with n/v x 1 wk. Hx of gastraparesis. Pt utilizes feeding tube for nutrition. Pt reports dizziness x 2 days with fatigue. Also states that CBG at home was reading "high". Reports SOB. Denies chest pain but states chest feels hot and that is worse with dizzy flashes. Pt reports concern for dehydration. Pt alert and oriented.

## 2023-02-15 NOTE — Assessment & Plan Note (Signed)
Hemoglobin is stable at this time with no acute bleeding.

## 2023-02-15 NOTE — Assessment & Plan Note (Signed)
Per chart review, patient had a history of HFrEF diagnosed while pregnant with recovered EF now.  At this time, she is hypovolemic.  She is not on any diuretics at home.  - Daily weights

## 2023-02-15 NOTE — Assessment & Plan Note (Signed)
In the setting of nausea/vomiting with poor p.o. intake.  History of CKD stage IIIb With baseline creatinine fluctuating frequently but most recently 1.69.  Currently 2.05.  - Repeat BMP in the a.m. - IV fluids as ordered - Avoid nephrotoxic agents

## 2023-02-15 NOTE — Assessment & Plan Note (Signed)
Continue home Cortef

## 2023-02-16 ENCOUNTER — Ambulatory Visit: Payer: Medicaid Other | Admitting: Surgery

## 2023-02-16 DIAGNOSIS — Z79890 Hormone replacement therapy (postmenopausal): Secondary | ICD-10-CM | POA: Diagnosis not present

## 2023-02-16 DIAGNOSIS — E1043 Type 1 diabetes mellitus with diabetic autonomic (poly)neuropathy: Secondary | ICD-10-CM | POA: Diagnosis not present

## 2023-02-16 DIAGNOSIS — Z934 Other artificial openings of gastrointestinal tract status: Secondary | ICD-10-CM | POA: Diagnosis not present

## 2023-02-16 DIAGNOSIS — E860 Dehydration: Secondary | ICD-10-CM | POA: Diagnosis not present

## 2023-02-16 DIAGNOSIS — E46 Unspecified protein-calorie malnutrition: Secondary | ICD-10-CM | POA: Diagnosis not present

## 2023-02-16 DIAGNOSIS — K3184 Gastroparesis: Secondary | ICD-10-CM | POA: Diagnosis present

## 2023-02-16 DIAGNOSIS — I130 Hypertensive heart and chronic kidney disease with heart failure and stage 1 through stage 4 chronic kidney disease, or unspecified chronic kidney disease: Secondary | ICD-10-CM | POA: Diagnosis not present

## 2023-02-16 DIAGNOSIS — R197 Diarrhea, unspecified: Secondary | ICD-10-CM | POA: Diagnosis present

## 2023-02-16 DIAGNOSIS — R112 Nausea with vomiting, unspecified: Secondary | ICD-10-CM | POA: Diagnosis not present

## 2023-02-16 DIAGNOSIS — E861 Hypovolemia: Secondary | ICD-10-CM | POA: Diagnosis present

## 2023-02-16 DIAGNOSIS — I5032 Chronic diastolic (congestive) heart failure: Secondary | ICD-10-CM | POA: Diagnosis not present

## 2023-02-16 DIAGNOSIS — E1065 Type 1 diabetes mellitus with hyperglycemia: Secondary | ICD-10-CM | POA: Diagnosis present

## 2023-02-16 DIAGNOSIS — E2740 Unspecified adrenocortical insufficiency: Secondary | ICD-10-CM | POA: Diagnosis not present

## 2023-02-16 DIAGNOSIS — Z23 Encounter for immunization: Secondary | ICD-10-CM | POA: Diagnosis not present

## 2023-02-16 DIAGNOSIS — I959 Hypotension, unspecified: Secondary | ICD-10-CM | POA: Diagnosis not present

## 2023-02-16 DIAGNOSIS — E43 Unspecified severe protein-calorie malnutrition: Secondary | ICD-10-CM | POA: Diagnosis not present

## 2023-02-16 DIAGNOSIS — R42 Dizziness and giddiness: Secondary | ICD-10-CM | POA: Diagnosis present

## 2023-02-16 DIAGNOSIS — N179 Acute kidney failure, unspecified: Secondary | ICD-10-CM | POA: Diagnosis not present

## 2023-02-16 DIAGNOSIS — D61818 Other pancytopenia: Secondary | ICD-10-CM | POA: Diagnosis not present

## 2023-02-16 DIAGNOSIS — Z8249 Family history of ischemic heart disease and other diseases of the circulatory system: Secondary | ICD-10-CM | POA: Diagnosis not present

## 2023-02-16 DIAGNOSIS — D631 Anemia in chronic kidney disease: Secondary | ICD-10-CM | POA: Diagnosis not present

## 2023-02-16 DIAGNOSIS — Z681 Body mass index (BMI) 19 or less, adult: Secondary | ICD-10-CM | POA: Diagnosis not present

## 2023-02-16 DIAGNOSIS — E1022 Type 1 diabetes mellitus with diabetic chronic kidney disease: Secondary | ICD-10-CM | POA: Diagnosis not present

## 2023-02-16 DIAGNOSIS — Z794 Long term (current) use of insulin: Secondary | ICD-10-CM | POA: Diagnosis not present

## 2023-02-16 DIAGNOSIS — N1832 Chronic kidney disease, stage 3b: Secondary | ICD-10-CM | POA: Diagnosis not present

## 2023-02-16 LAB — CBC
HCT: 19.7 % — ABNORMAL LOW (ref 36.0–46.0)
Hemoglobin: 7.1 g/dL — ABNORMAL LOW (ref 12.0–15.0)
MCH: 30 pg (ref 26.0–34.0)
MCHC: 36 g/dL (ref 30.0–36.0)
MCV: 83.1 fL (ref 80.0–100.0)
Platelets: 119 10*3/uL — ABNORMAL LOW (ref 150–400)
RBC: 2.37 MIL/uL — ABNORMAL LOW (ref 3.87–5.11)
RDW: 12 % (ref 11.5–15.5)
WBC: 4.1 10*3/uL (ref 4.0–10.5)
nRBC: 0 % (ref 0.0–0.2)

## 2023-02-16 LAB — GLUCOSE, CAPILLARY
Glucose-Capillary: 111 mg/dL — ABNORMAL HIGH (ref 70–99)
Glucose-Capillary: 144 mg/dL — ABNORMAL HIGH (ref 70–99)
Glucose-Capillary: 203 mg/dL — ABNORMAL HIGH (ref 70–99)
Glucose-Capillary: 93 mg/dL (ref 70–99)
Glucose-Capillary: 93 mg/dL (ref 70–99)

## 2023-02-16 LAB — BASIC METABOLIC PANEL
Anion gap: 5 (ref 5–15)
BUN: 34 mg/dL — ABNORMAL HIGH (ref 6–20)
CO2: 22 mmol/L (ref 22–32)
Calcium: 8 mg/dL — ABNORMAL LOW (ref 8.9–10.3)
Chloride: 110 mmol/L (ref 98–111)
Creatinine, Ser: 1.5 mg/dL — ABNORMAL HIGH (ref 0.44–1.00)
GFR, Estimated: 47 mL/min — ABNORMAL LOW (ref >60)
Glucose, Bld: 141 mg/dL — ABNORMAL HIGH (ref 70–99)
Potassium: 4.4 mmol/L (ref 3.5–5.1)
Sodium: 137 mmol/L (ref 135–145)

## 2023-02-16 LAB — MAGNESIUM: Magnesium: 1.9 mg/dL (ref 1.7–2.4)

## 2023-02-16 MED ORDER — ONDANSETRON HCL 4 MG/2ML IJ SOLN
4.0000 mg | INTRAMUSCULAR | Status: DC | PRN
  Administered 2023-02-16 (×2): 4 mg via INTRAVENOUS
  Filled 2023-02-16 (×2): qty 2

## 2023-02-16 MED ORDER — GLUCERNA 1.5 CAL PO LIQD
980.0000 mL | Freq: Every day | ORAL | Status: DC
  Administered 2023-02-16 – 2023-02-17 (×2): 980 mL

## 2023-02-16 MED ORDER — ACETAMINOPHEN 650 MG RE SUPP: 650.0000 mg | Freq: Four times a day (QID) | RECTAL | Status: DC | PRN

## 2023-02-16 MED ORDER — ACETAMINOPHEN 325 MG PO TABS
650.0000 mg | ORAL_TABLET | Freq: Four times a day (QID) | ORAL | Status: DC | PRN
  Administered 2023-02-18: 650 mg
  Filled 2023-02-16: qty 2

## 2023-02-16 MED ORDER — LEVOTHYROXINE SODIUM 50 MCG PO TABS
50.0000 ug | ORAL_TABLET | Freq: Every day | ORAL | Status: DC
  Administered 2023-02-16 – 2023-02-18 (×3): 50 ug
  Filled 2023-02-16 (×3): qty 1

## 2023-02-16 MED ORDER — ZINC SULFATE 220 (50 ZN) MG PO CAPS
220.0000 mg | ORAL_CAPSULE | Freq: Every day | ORAL | Status: DC
  Administered 2023-02-16 – 2023-02-18 (×3): 220 mg
  Filled 2023-02-16 (×3): qty 1

## 2023-02-16 MED ORDER — HYDROCORTISONE 10 MG PO TABS
20.0000 mg | ORAL_TABLET | Freq: Every day | ORAL | Status: DC
  Administered 2023-02-16 – 2023-02-18 (×3): 20 mg
  Filled 2023-02-16 (×3): qty 2

## 2023-02-16 MED ORDER — GLUCERNA 1.5 CAL PO LIQD: 980.0000 mL | Freq: Every day | ORAL | Status: DC

## 2023-02-16 MED ORDER — HYDROCORTISONE 10 MG PO TABS
10.0000 mg | ORAL_TABLET | Freq: Every day | ORAL | Status: DC
  Administered 2023-02-16 – 2023-02-17 (×2): 10 mg
  Filled 2023-02-16 (×3): qty 1

## 2023-02-16 MED ORDER — METOCLOPRAMIDE HCL 5 MG/ML IJ SOLN
10.0000 mg | Freq: Four times a day (QID) | INTRAMUSCULAR | Status: DC | PRN
  Administered 2023-02-16: 10 mg via INTRAVENOUS
  Filled 2023-02-16: qty 2

## 2023-02-16 MED ORDER — FLUOXETINE HCL 10 MG PO CAPS
10.0000 mg | ORAL_CAPSULE | Freq: Every day | ORAL | Status: DC
  Administered 2023-02-16 – 2023-02-18 (×3): 10 mg
  Filled 2023-02-16 (×3): qty 1

## 2023-02-16 MED ORDER — LOPERAMIDE HCL 2 MG PO CAPS
2.0000 mg | ORAL_CAPSULE | ORAL | Status: DC | PRN
  Administered 2023-02-16 – 2023-02-17 (×4): 2 mg via ORAL
  Filled 2023-02-16 (×4): qty 1

## 2023-02-16 MED ORDER — FREE WATER
50.0000 mL | Status: DC
  Administered 2023-02-16 – 2023-02-18 (×12): 50 mL

## 2023-02-16 MED ORDER — INFLUENZA VIRUS VACC SPLIT PF (FLUZONE) 0.5 ML IM SUSY
0.5000 mL | PREFILLED_SYRINGE | INTRAMUSCULAR | Status: AC
  Administered 2023-02-17: 0.5 mL via INTRAMUSCULAR
  Filled 2023-02-16: qty 0.5

## 2023-02-16 NOTE — Plan of Care (Signed)
Problem: Education: Goal: Knowledge of General Education information will improve Description: Including pain rating scale, medication(s)/side effects and non-pharmacologic comfort measures Outcome: Progressing   Problem: Health Behavior/Discharge Planning: Goal: Ability to manage health-related needs will improve Outcome: Progressing   Problem: Clinical Measurements: Goal: Diagnostic test results will improve Outcome: Progressing   Problem: Activity: Goal: Risk for activity intolerance will decrease Outcome: Progressing   Problem: Nutrition: Goal: Adequate nutrition will be maintained Outcome: Progressing   Problem: Elimination: Goal: Will not experience complications related to bowel motility Outcome: Progressing Goal: Will not experience complications related to urinary retention Outcome: Progressing   Problem: Pain Managment: Goal: General experience of comfort will improve Outcome: Progressing

## 2023-02-16 NOTE — Discharge Instructions (Addendum)
Please call [facility] to make appointment with Dr. Wendall Mola to re-establish care. [address], [place], [place] [address] Phone: [phone]  Transportation Resources  Agency Name: [facility] Agency Address: [address] [place], [place] [address] Phone: [phone] Email: [email or web address] Website: [email or web address] Service(s) Offered: Housing services, self-sufficiency, congregate meal program, weatherization program, Field seismologist program, emergency food assistance,  housing counseling, home ownership program, wheels-towork program.  Agency Name: [facility] [employer] [phone]) Address: [address], [place], [place] [address] Phone: [phone] Website: [email or web address] Service(s) Offered: Transportation for [employer], subscription and demand response; Dial-a-Ride for citizens 70 years of age or older.  Agency Name: Department of Social Services Address: [address] [place], [place] [address] Phone: [phone] Service(s) Offered: Child support services; child welfare services; food stamps; Medicaid; work first family assistance; and aid with fuel,  rent, food and medicine, transportation assistance.  Agency Name: Disabled [employer] (DAV) Transportation  Network Phone: [phone] Service(s) Offered: Transports veterans to the [facility] medical center. Call  forty-eight hours in advance and leave the name, telephone  number, date, and time of appointment. Veteran will be  contacted by the driver the day before the appointment to  arrange a pick up point   Transportation Resources  Agency Name: [facility] Agency Address: [address] [place], [place] [address] Phone: [phone] Email: [email or web address] Website: [email or web address] Service(s) Offered: Housing services, self-sufficiency, congregate meal  program, weatherization program, Field seismologist program, emergency food assistance,  housing counseling, home ownership program, wheels-towork program.  Agency Name: [facility] [employer] [phone]) Address: [address] [place], [place] [address] Phone: [phone] Website: [email or web address] Service(s) Offered: Transportation for [employer], subscription and demand response; Dial-a-Ride for citizens 54 years of age or older.  Agency Name: Department of Social Services Address: [address] [place], [place] [address] Phone: [phone] Service(s) Offered: Child support services; child welfare services; food stamps; Medicaid; work first family assistance; and aid with fuel,  rent, food and medicine, transportation assistance.  Agency Name: Disabled [employer] (DAV) Transportation  Network Phone: [phone] Service(s) Offered: Transports veterans to the [facility] medical center. Call  forty-eight hours in advance and leave the name, telephone  number, date, and time of appointment. Veteran will be  contacted by the driver the day before the appointment to  arrange a pick up point    [employer] ACTA currently provides door to door services. ACTA connects with PART daily for services to [facility]. ACTA also performs contract services to [employer] operates 27 vehicles, all but 3 mini-vans are equipped with lifts for special needs as well as the general public. ACTA drivers are each CDL certified and trained in First Aid and CPR. ACTA was established in 2002 by [employer]. An independent Industrial/product designer. ACTA operates via Cytogeneticist with required Research scientist (physical sciences) from [place]. ACT[employer] provides over 80,000 passenger trips each year, including [facility] and [employer]  sites.  Call at least by 11 AM one business day prior to needing transportation  [employer].                      [place], [place] [address]     Office Hours: Monday-Friday  8 AM - 5 PM   If you need transportation services right away, call [employer] at [phone]. Our after-hours call  center is staffed 24 hours to handle ride assistance and urgent reservation requests (including discharges) 365 days a year. Urgent trips include sick visits, hospital discharge requests and life-sustaining treatment. Medicaid Transportation  To apply for NEMT services or to schedule a ride, call [phone].  [employer] Call Retail banker): [phone]

## 2023-02-16 NOTE — Progress Notes (Signed)
Initial Nutrition Assessment  DOCUMENTATION CODES:   Underweight, Non-severe (moderate) malnutrition in context of chronic illness  INTERVENTION:   Nocturnal tube feeds via j-tube:    Glucerna 1.5@70ml /hr x 14 hrs overnight (1800-0800)   Free water flushes 50ml q4 hours to maintain tube patency    Regimen provides 1470kcal/day, 81g/day protein and 1032ml/day of free water.   NUTRITION DIAGNOSIS:   Moderate Malnutrition related to chronic illness (gastroparesis) as evidenced by mild fat depletion, moderate fat depletion, mild muscle depletion, moderate muscle depletion, percent weight loss.  GOAL:   Patient will meet greater than or equal to 90% of their needs  MONITOR:   PO intake, TF tolerance  REASON FOR ASSESSMENT:   Consult Enteral/tube feeding initiation and management  ASSESSMENT:   Pt with medical history significant of type 1 diabetes, adrenal insufficiency, gastroparesis, protein malnutrition s/p J-tube placement on nighttime tube feeds, normocytic anemia, CKD stage IIIb who presents due to nausea and vomiting.  Pt admitted with hypotension and intractable nausea and vomiting.   9/24- advanced to clear liquid diet 9/25- advanced to full liquid diet  Reviewed I/O's: +320 ml x 24 hours   Pt very familiar to this RD due to multiple prior admissions.   Spoke with pt at bedside, who reports feeling better today. She reports tolerating full liquid diet well, consumed 100% of potato soup without difficulty. Pt shares that she has had poor oral intake over the past 1.5 weeks secondary to nausea, however, did not go to the hospital in hopes that this would improve as well as not having childcare for her kids. She was supposed to have an appointment with Dr Everlene Farrier today, as she noticed some blood and drainage out of the j-tube site today. She admits that her two year old often pulls at the tube.   Pt tries to eat small, frequent meals at home. Frequently consumed foods  include drinkable yogurt, chicken noodle soup, grilled chicken and almond milk. Pt shares that she feels better as she made modifications to her diet, including less fiber and low fat foods. However, over the past week or so, pt was consuming primarily liquids (soup, drinkable yogurt, jello, and popsicles).   Pt admits to not using her j-tube regularly at home. Due to having to care for her two year old, she is only able to infuse TF for 4-5 hours per day max. Pt infuses TF intermittently, only a few times per month since last discharge. Pt with a complicated social situation, which is her largest barrier to care. She often has difficulty attending medical appointments due to lack of childcare. SHe also reports she has been recommended G-POEM procedure by GI, however, she has been unable to proceed as this procedure is not available locally and is unable to obtain transportation to appointment to [facility] or [place]. She has been unable to obtain endocrinology follow-up due to expired referral and has had difficulty contacting office to change her PCP (current PCP is in [place] and pt does not have the resources to be transported there).   Pt thinks that her weight has been stable. Reviewed wt hx; pt has experienced a 11.3% wt loss over the past 3 months, which is significant for time frame.   Medications reviewed.   Lab Results  Component Value Date   HGBA1C 9.4 (H) 11/02/2022   PTA DM medications are 2 units insulin aspart TID, 0-6 units insulin aspart TID, and 5 units insulin glargine BID.   Labs reviewed: CBGS: 93-162 (  inpatient orders for glycemic control are 2 units insulin aspart TID with meals and 5 units insulin glargine-yfgn BID).    NUTRITION - FOCUSED PHYSICAL EXAM:  Flowsheet Row Most Recent Value  Orbital Region No depletion  Upper Arm Region Moderate depletion  Thoracic and Lumbar Region Mild depletion  Buccal Region No depletion  Temple Region No depletion  Clavicle Bone  Region Moderate depletion  Clavicle and Acromion Bone Region Moderate depletion  Scapular Bone Region Moderate depletion  Dorsal Hand Mild depletion  Patellar Region Mild depletion  Anterior Thigh Region Mild depletion  Posterior Calf Region Mild depletion  Edema (RD Assessment) None  Hair Reviewed  Eyes Reviewed  Mouth Reviewed  Skin Reviewed  Nails Reviewed       Diet Order:   Diet Order             Diet full liquid Room service appropriate? Yes; Fluid consistency: Thin  Diet effective now                   EDUCATION NEEDS:   Education needs have been addressed  Skin:  Skin Assessment: Reviewed RN Assessment  Last BM:  02/16/23 (type 7)  Height:   Ht Readings from Last 1 Encounters:  02/15/23 5\' 1"  (1.549 m)    Weight:   Wt Readings from Last 1 Encounters:  02/16/23 43.1 kg    Ideal Body Weight:  47.7 kg  BMI:  Body mass index is 17.95 kg/m.  Estimated Nutritional Needs:   Kcal:  1550-1750  Protein:  75-90 grams  Fluid:  > 1.5 L    Levada Schilling, RD, LDN, CDCES Registered Dietitian III Certified Diabetes Care and Education Specialist Please refer to [facility] for RD and/or RD on-call/weekend/after hours pager

## 2023-02-16 NOTE — TOC Progression Note (Signed)
Transition of Care [facility]) - Progression Note    Patient Details  Name: Ashley Camacho MRN: [id number] Date of Birth: 07/04/1990  Transition of Care [facility]) CM/SW Contact  Marlowe Sax, RN Phone Number: 02/16/2023, 3:15 PM  Clinical Narrative:     Per review of the chart  She notes that her 32-year-old is very rambunctious and frequently grabs at her J-tube anytime she tries to use it.  In addition, her toddler sleeps with her and when she turns on the machine at night, wakes up her toddler.  The longest she can do tube feeds at home is 4 hours and she has only been able to do this twice in the last week.  She notes that she does not have much support from her ex-husband, with whom she still resides, nor her mother who lives nearby.  This has been the biggest barrier for her   She has been provided resources for tube feeds by adapt during previous admission She has been provided transportation assistance with Medicaid resource at previous admission, She was provided this again [facility] assisted her into getting into a group home during previous admission then she declined TOC to continue to follow for needs    Barriers to Discharge: Continued Medical Work up  Expected Discharge Plan and Services   Discharge Planning Services: CM Consult   Living arrangements for the past 2 months: Single Family Home                 DME Arranged: N/A DME Agency: NA       HH Arranged: NA           Social Determinants of Health (SDOH) Interventions SDOH Screenings   Food Insecurity: No Food Insecurity (02/16/2023)  Housing: Low Risk  (02/16/2023)  Transportation Needs: No Transportation Needs (02/16/2023)  Utilities: Not At Risk (02/16/2023)  Depression (PHQ2-9): Low Risk  (11/11/2020)  Financial Resource Strain: Medium Risk (09/01/2021)   Received from [facility]  Physical Activity: Inactive (08/19/2021)   [facility] from [facility]  Social Connections: Unknown (08/19/2021)   Received from [facility]  Tobacco Use: Low Risk  (02/15/2023)    Readmission Risk Interventions    11/24/2022   11:04 AM 11/03/2021   10:37 AM 06/16/2021   12:30 PM  Readmission Risk Prevention Plan  Transportation Screening Complete Complete Complete  Medication Review Oceanographer) Complete Complete Complete  PCP or Specialist appointment within 3-5 days of discharge Complete Complete Complete  HRI or Home Care Consult  Not Complete   HRI or Home Care Consult Pt Refusal Comments  NA   SW Recovery Care/Counseling Consult Complete Not Complete Complete  SW Consult Not Complete Comments  NA   Palliative Care Screening Not Applicable Not Applicable Not Applicable  Skilled Nursing Facility Not Applicable Not Applicable Not Applicable

## 2023-02-16 NOTE — ED Notes (Signed)
Patient being transferred by RN to inpatient floor.

## 2023-02-16 NOTE — Progress Notes (Signed)
Patient complaint of abdominal cramps after tube feeding started, patient had two liquid bowel movement. Patient requested to hold her tube feeding, she still nauseated. the author notified Dr. Arville Care about patient request , he ordered to hold it.

## 2023-02-16 NOTE — Progress Notes (Signed)
Progress Note   Patient: Ashley Camacho YQI:[id number] DOB: [birth year] DOA: 02/15/2023     0 DOS: the patient was seen and examined on 02/16/2023    Subjective:  Patient seen and examined at bedside this morning She complains of nausea but no vomiting Renal function has improved from 2-1.5 today   Brief hospital course: From HPI "Ashley Camacho is a 32 y.o. female with medical history significant of type 1 diabetes, adrenal insufficiency, gastroparesis, protein malnutrition s/p J-tube placement on nighttime tube feeds, normocytic anemia, CKD stage IIIb who presents to the ED due to nausea and vomiting.   Ashley Camacho states that she has been experiencing nausea and vomiting that has been intractable for approximately 1 week now.  She has not been experiencing any abdominal pain.  Due to poor p.o. intake, she began to experience dizziness that particularly occurred when she would change positions.  She endorses feeling generally weak and dehydrated.  She denies any other symptoms at this time.  She notes that this feels like her typical flare.   Ashley Camacho states that she has not been able to utilize her nighttime tube feeds.  She notes that her 37-year-old is very rambunctious and frequently grabs at her J-tube anytime she tries to use it.  In addition, her toddler sleeps with her and when she turns on the machine at night, wakes up her toddler.  The longest she can do tube feeds at home is 4 hours and she has only been able to do this twice in the last week.  She notes that she does not have much support from her ex-husband, with whom she still resides, nor her mother who lives nearby.  This has been the biggest barrier for her.   ED Course:  On arrival to the ED, patient was initially quite hypotensive but improved to 98/59 with IV fluids.  She was afebrile at 97.7.  Heart rate ranged between 74-79. Initial workup notable for hemoglobin of 8.7, glucose of 180, BUN 45, creatinine 2.05 with GFR of  33, bicarb of 25 and anion gap of 11.  Troponin negative.  Due to intractable nausea/vomiting with AKI, TRH contacted for admission.  "    Assessment and Plan:  Hypotension Patient presenting with significant hypotension as low as 78/66 in the setting of nausea/vomiting and poor p.o. intake leading to hypovolemic hypotension and dizziness.  Blood pressure has improved significantly with IV fluid resuscitation.   - Continue maintenance IV fluids - Management of nutrition as noted below   Intractable nausea and vomiting Patient has a history of intractable nausea and vomiting with multiple hospitalizations.  History of severe gastroparesis, however interestingly, most recent gastric emptying study on August 23 was normal; unclear if this could be a false negative.   Continue IV fluid - Zofran or Phenergan as needed every 6 hours for nausea and vomiting   Acute kidney injury superimposed on chronic kidney disease (HCC) In the setting of nausea/vomiting with poor p.o. intake.  History of CKD stage IIIb With baseline creatinine fluctuating frequently but most recently 1.69.  Currently 2.05.   -Continue to monitor - IV fluids as ordered -Continue to monitor renal function - Avoid nephrotoxic agents   Type 1 diabetes mellitus without complications (HCC) Per chart review, patient has a history of type 1 diabetes with labile blood sugars.On most recent admission, recurrent hypoglycemia led to patient receiving 2 units with meals and 5 units of Semglee twice daily.  Will continue that regimen  at this time.  No evidence of DKA at this time with normal bicarb and anion gap.   - Scheduled meal coverage of 2 units - Semglee 5 units twice daily - Close CBG monitoring   Gastroparesis History of severe gastroparesis diagnosed in 2017, however most recent emptying study was normal.  Follows with GI.  Did not previously respond to Reglan or erythromycin   Severe malnutrition (HCC) History of severe  malnutrition, currently pre-scribed nightly tube feeds via J-tube.  Unfortunately, patient has not been able to utilize consistent nighttime tube feeds due to social barriers.  She has limited support from her ex-husband with whom she still lives in her mother does not assist with her medical needs.  In addition, her 77-year-old is frequently awoken by the tube feeds and frequently tries to grab at the tube.   -Continue nighttime tube feeds - Dietitian consulted   Adrenal insufficiency (HCC) Continue home Cortef   Anemia of chronic disease Hemoglobin has decreased from 8.7-7.1 today-denies any evidence of ongoing bleeding We will continue to monitor closely and transfuse with hemoglobin less than 7   Congestive heart failure (HCC) Per chart review, patient had a history of HFrEF diagnosed while pregnant with recovered EF now.  At this time, she is hypovolemic.   She is not on any diuretics at home.   - Daily weights   Advance Care Planning:   Code Status: Full Code    Consults: None   Family Communication: No family at bedside.        Physical Exam:  General: Appears malnourished in no acute distress Eyes:     Conjunctiva/sclera: Conjunctivae normal.     Pupils: Pupils are equal, round, and reactive to light.  Cardiovascular:     Rate and Rhythm: Normal rate and regular rhythm.     Heart sounds: No murmur heard.    No gallop.  Pulmonary:     Effort: Pulmonary effort is normal. No respiratory distress.     Breath sounds: Normal breath sounds.  Abdominal:     General: Bowel sounds are normal.     Tenderness: There is no abdominal tenderness. There is no guarding.     Comments: J-tube present in the left upper quadrant. Some granulation tissue is present, but no erythema to suggest infection.   Musculoskeletal:     Right lower leg: No edema.  Neurological:     Mental Status: She is alert and oriented to person, place, and time. Mental status is at baseline.  Psychiatric:         Mood and Affect: Mood normal.        Behavior: Behavior n Vitals:   02/15/23 2315 02/16/23 0023 02/16/23 0500 02/16/23 0755  BP: 137/78 139/79  105/64  Pulse: 73 75  76  Resp: 13 16  16   Temp:  97.8 F (36.6 C)  97.8 F (36.6 C)  TempSrc:  Oral    SpO2: 100% 100%  100%  Weight:   43.1 kg   Height:       5 Data Reviewed: I have reviewed patient's lab results showing improvement in creatinine from 2-1.5 hemoglobin from 8.7-7.1 5  Time spent: 55 minutes  Author: Loyce Dys, MD 02/16/2023 5:11 PM  For on call review [email or web address].

## 2023-02-17 DIAGNOSIS — E860 Dehydration: Secondary | ICD-10-CM | POA: Diagnosis not present

## 2023-02-17 LAB — BASIC METABOLIC PANEL
Anion gap: 4 — ABNORMAL LOW (ref 5–15)
BUN: 25 mg/dL — ABNORMAL HIGH (ref 6–20)
CO2: 19 mmol/L — ABNORMAL LOW (ref 22–32)
Calcium: 8 mg/dL — ABNORMAL LOW (ref 8.9–10.3)
Chloride: 111 mmol/L (ref 98–111)
Creatinine, Ser: 1.4 mg/dL — ABNORMAL HIGH (ref 0.44–1.00)
GFR, Estimated: 52 mL/min — ABNORMAL LOW (ref >60)
Glucose, Bld: 422 mg/dL — ABNORMAL HIGH (ref 70–99)
Potassium: 4.8 mmol/L (ref 3.5–5.1)
Sodium: 134 mmol/L — ABNORMAL LOW (ref 135–145)

## 2023-02-17 LAB — CBC WITH DIFFERENTIAL/PLATELET
Abs Immature Granulocytes: 0.01 10*3/uL (ref 0.00–0.07)
Basophils Absolute: 0 10*3/uL (ref 0.0–0.1)
Basophils Relative: 1 %
Eosinophils Absolute: 0 10*3/uL (ref 0.0–0.5)
Eosinophils Relative: 1 %
HCT: 21.4 % — ABNORMAL LOW (ref 36.0–46.0)
Hemoglobin: 7.6 g/dL — ABNORMAL LOW (ref 12.0–15.0)
Immature Granulocytes: 0 %
Lymphocytes Relative: 22 %
Lymphs Abs: 0.7 10*3/uL (ref 0.7–4.0)
MCH: 30.4 pg (ref 26.0–34.0)
MCHC: 35.5 g/dL (ref 30.0–36.0)
MCV: 85.6 fL (ref 80.0–100.0)
Monocytes Absolute: 0.2 10*3/uL (ref 0.1–1.0)
Monocytes Relative: 6 %
Neutro Abs: 2.3 10*3/uL (ref 1.7–7.7)
Neutrophils Relative %: 70 %
Platelets: 109 10*3/uL — ABNORMAL LOW (ref 150–400)
RBC: 2.5 MIL/uL — ABNORMAL LOW (ref 3.87–5.11)
RDW: 12.1 % (ref 11.5–15.5)
WBC: 3.3 10*3/uL — ABNORMAL LOW (ref 4.0–10.5)
nRBC: 0 % (ref 0.0–0.2)

## 2023-02-17 LAB — GLUCOSE, CAPILLARY
Glucose-Capillary: 397 mg/dL — ABNORMAL HIGH (ref 70–99)
Glucose-Capillary: 146 mg/dL — ABNORMAL HIGH (ref 70–99)
Glucose-Capillary: 182 mg/dL — ABNORMAL HIGH (ref 70–99)
Glucose-Capillary: 189 mg/dL — ABNORMAL HIGH (ref 70–99)
Glucose-Capillary: 182 mg/dL — ABNORMAL HIGH (ref 70–99)

## 2023-02-17 MED ORDER — INSULIN GLARGINE-YFGN 100 UNIT/ML ~~LOC~~ SOLN
5.0000 [IU] | Freq: Two times a day (BID) | SUBCUTANEOUS | Status: DC
  Administered 2023-02-17 – 2023-02-18 (×2): 5 [IU] via SUBCUTANEOUS
  Filled 2023-02-17 (×3): qty 0.05

## 2023-02-17 MED ORDER — INSULIN ASPART 100 UNIT/ML IJ SOLN
0.0000 [IU] | INTRAMUSCULAR | Status: DC
  Administered 2023-02-17 – 2023-02-18 (×3): 1 [IU] via SUBCUTANEOUS
  Filled 2023-02-17 (×3): qty 1

## 2023-02-17 MED ORDER — GLUCERNA 1.5 CAL PO LIQD
1000.0000 mL | ORAL | Status: AC
  Administered 2023-02-17 (×2): 1000 mL

## 2023-02-17 MED ORDER — INSULIN GLARGINE-YFGN 100 UNIT/ML ~~LOC~~ SOLN: 15.0000 [IU] | Freq: Two times a day (BID) | SUBCUTANEOUS | Status: DC

## 2023-02-17 MED ORDER — INSULIN ASPART 100 UNIT/ML IJ SOLN
2.0000 [IU] | Freq: Three times a day (TID) | INTRAMUSCULAR | Status: DC
  Administered 2023-02-18: 2 [IU] via SUBCUTANEOUS
  Filled 2023-02-17: qty 1

## 2023-02-17 NOTE — Progress Notes (Signed)
Progress Note   Patient: Ashley Camacho [id number]:[id number] DOB: 1990/12/05 DOA: 02/15/2023     1 DOS: the patient was seen and examined on 02/17/2023     Subjective:  Patient seen and examined at bedside this morning Did have some diarrhea as well as nausea but no vomiting She has agreed for her diet to be advanced to carb controlled She is assisting her feeding with G-tube feeding in the evening, dietitian on board Denies abdominal pain chest pain or cough   Brief hospital course: From HPI "Ashley Camacho is a 32 y.o. female with medical history significant of type 1 diabetes, adrenal insufficiency, gastroparesis, protein malnutrition s/p J-tube placement on nighttime tube feeds, normocytic anemia, CKD stage IIIb who presents to the ED due to nausea and vomiting.   [name] states that she has been experiencing nausea and vomiting that has been intractable for approximately 1 week now.  She has not been experiencing any abdominal pain.  Due to poor p.o. intake, she began to experience dizziness that particularly occurred when she would change positions.  She endorses feeling generally weak and dehydrated.  She denies any other symptoms at this time.  She notes that this feels like her typical flare.   [name] states that she has not been able to utilize her nighttime tube feeds.  She notes that her 18-year-old is very rambunctious and frequently grabs at her J-tube anytime she tries to use it.  In addition, her toddler sleeps with her and when she turns on the machine at night, wakes up her toddler.  The longest she can do tube feeds at home is 4 hours and she has only been able to do this twice in the last week.  She notes that she does not have much support from her ex-husband, with whom she still resides, nor her mother who lives nearby.  This has been the biggest barrier for her.   ED Course:  On arrival to the ED, patient was initially quite hypotensive but improved to 98/59 with IV  fluids.  She was afebrile at 97.7.  Heart rate ranged between 74-79. Initial workup notable for hemoglobin of 8.7, glucose of 180, BUN 45, creatinine 2.05 with GFR of 33, bicarb of 25 and anion gap of 11.  Troponin negative.  Due to intractable nausea/vomiting with AKI, TR[facility] contacted for admission.  "     Assessment and Plan:  Hypotension-improved Patient presenting with significant hypotension as low as 78/66 in the setting of nausea/vomiting and poor p.o. intake leading to hypovolemic hypotension and dizziness.  Blood pressure has improved significantly with IV fluid resuscitation.   - Continue maintenance IV fluids - Management of nutrition as noted below   Intractable nausea and vomiting Patient has a history of intractable nausea and vomiting with multiple hospitalizations.  History of severe gastroparesis, however interestingly, most recent gastric emptying study on August 23 was normal; unclear if this could be a false negative.   Continue IV fluid - Zofran or Phenergan as needed every 6 hours for nausea and vomiting   Acute kidney injury superimposed on chronic kidney disease (HCC) In the setting of nausea/vomiting with poor p.o. intake.  History of CKD stage IIIb With baseline creatinine fluctuating frequently but most recently 1.69.  Currently 2.05.   -Continue to monitor - IV fluids as ordered -Continue to monitor renal function - Avoid nephrotoxic agents   Type 1 diabetes mellitus without complications (HCC) Per chart review, patient has a history of  type 1 diabetes with labile blood sugars.On most recent admission, recurrent hypoglycemia led to patient receiving 2 units with meals and 5 units of Semglee twice daily.  Will continue that regimen at this time.  No evidence of DKA at this time with normal bicarb and anion gap.   - Scheduled meal coverage of 2 units - Continue Semglee 5 units twice daily, this was discussed with diabetic coordinator and we will adjust insulin as  needed since patient is a brittle diabetic Monitor CBC closely   Gastroparesis History of severe gastroparesis diagnosed in 2017, however most recent emptying study was normal.  Follows with GI.  Did not previously respond to Reglan or erythromycin   Severe malnutrition (HCC) History of severe malnutrition, currently pre-scribed nightly tube feeds via J-tube.  Unfortunately, patient has not been able to utilize consistent nighttime tube feeds due to social barriers.  She has limited support from her ex-husband with whom she still lives in her mother does not assist with her medical needs.  In addition, her 53-year-old is frequently awoken by the tube feeds and frequently tries to grab at the tube.   Continue nighttime tube feeds Dietitian consulted to assess with J-tube feeding   Adrenal insufficiency (HCC) Continue home Cortef   Anemia of chronic disease Hemoglobin has decreased from 8.7-7.1 today-denies any evidence of ongoing bleeding We will continue to monitor closely and transfuse with hemoglobin less than 7     Congestive heart failure (HCC) Per chart review, patient had a history of HFrEF diagnosed while pregnant with recovered EF now.  At this time, she is hypovolemic.   She is not on any diuretics at home. Monitor weights daily   Advance Care Planning:   Code Status: Full Code    Consults: None   Family Communication: No family at bedside.            Physical Exam:   General: Appears malnourished in no acute distress Eyes:     Conjunctiva/sclera: Conjunctivae normal.     Pupils: Pupils are equal, round, and reactive to light.  Cardiovascular:     Rate and Rhythm: Normal rate and regular rhythm.     Heart sounds: No murmur heard.    No gallop.  Pulmonary:     Effort: Pulmonary effort is normal. No respiratory distress.     Breath sounds: Normal breath sounds.  Abdominal:     General: Bowel sounds are normal.     Tenderness: There is no abdominal tenderness.  There is no guarding.     Comments: J-tube present in the left upper quadrant. Some granulation tissue is present, but no erythema to suggest infection.   Musculoskeletal:     Right lower leg: No edema.  Neurological:     Mental Status: She is alert and oriented to person, place, and time. Mental status is at baseline.  Psychiatric:        Mood and Affect: Mood normal.        Behavior: Behavior n  5 Data Reviewed: I have reviewed patient's lab results as well as TOC manager documentation and vitals as shown below     Latest Ref Rng & Units 02/17/2023    4:06 AM 02/16/2023    6:42 AM 02/15/2023    8:11 PM  CBC  WBC 4.0 - 10.5 K/uL 3.3  4.1  4.1   Hemoglobin 12.0 - 15.0 g/dL 7.6  7.1  8.7   Hematocrit 36.0 - 46.0 % 21.4  19.7  24.3  Platelets 150 - 400 K/uL 109  119  172        Latest Ref Rng & Units 02/17/2023    4:06 AM 02/16/2023    6:42 AM 02/15/2023    8:11 PM  BMP  Glucose 70 - 99 mg/dL 161  096  045   BUN 6 - 20 mg/dL 25  34  45   Creatinine 0.44 - 1.00 mg/dL 4.09  8.11  9.14   Sodium 135 - 145 mmol/L 134  137  135   Potassium 3.5 - 5.1 mmol/L 4.8  4.4  4.3   Chloride 98 - 111 mmol/L 111  110  99   CO2 22 - 32 mmol/L 19  22  25    Calcium 8.9 - 10.3 mg/dL 8.0  8.0  9.5       Time spent: 45 minutes     Vitals:   02/16/23 1713 02/16/23 2331 02/17/23 0500 02/17/23 0756  BP: 131/72 132/80  137/87  Pulse: 71 92  86  Resp: 16 20  18   Temp: 98.3 F (36.8 C) 98 F (36.7 C)  98.5 F (36.9 C)  TempSrc:  Oral  Oral  SpO2: 100% 100%  100%  Weight:   44.8 kg   Height:         Author: Loyce Dys, MD 02/17/2023 3:11 PM  For on call review [email or web address].

## 2023-02-17 NOTE — Inpatient Diabetes Management (Signed)
Inpatient Diabetes Program Recommendations  AACE/ADA: New Consensus Statement on Inpatient Glycemic Control (2015)  Target Ranges:  Prepandial:   less than 140 mg/dL      Peak postprandial:   less than 180 mg/dL (1-2 hours)      Critically ill patients:  140 - 180 mg/dL   Lab Results  Component Value Date   GLUCAP 397 (H) 02/17/2023   HGBA1C 9.4 (H) 11/02/2022    Review of Glycemic Control  Latest Reference Range & Units 02/16/23 07:56 02/16/23 11:15 02/16/23 16:54 02/16/23 21:30 02/17/23 08:04  Glucose-Capillary 70 - 99 mg/dL 93 93 366 (H) 440 (H) 347 (H)   Diabetes history: DM type 1 Outpatient Diabetes medications: Novolog 2 units tid, Novolog 0-6 units 4x/day, Basaglar 5 units bid Current orders for Inpatient glycemic control:  Semglee 15 units bid Novolog 2 units tid with meals  Glucerna 70 ml/hour Tube Feeds for 14 hours Cortef 20 mg qam, 10 mg qpm  Noted: Hyperglycemia this am 397. Pt is very familiar to our team and is very sensitive to insulin  Inpatient Diabetes Program Recommendations:    -   Reduce Semglee to 5 units bid -   Add Novolog 0-6 units Q4 hours -   Adjust Novolog 2 units to match with Tube Feed times overnight at 12 am, 4 am, and 8 am to match with correction insulin times  Thanks,  Christena Deem RN, MSN, BC-ADM Inpatient Diabetes Coordinator Team Pager [phone] (8a-5p)

## 2023-02-17 NOTE — Plan of Care (Signed)
  Problem: Education: Goal: Ability to describe self-care measures that may prevent or decrease complications (Diabetes Survival Skills Education) will improve Outcome: Progressing   Problem: Cardiac: Goal: Ability to maintain an adequate cardiac output will improve Outcome: Progressing   Problem: Nutritional: Goal: Maintenance of adequate weight for body size and type will improve Outcome: Progressing   Problem: Health Behavior/Discharge Planning: Goal: Ability to manage health-related needs will improve Outcome: Progressing

## 2023-02-17 NOTE — TOC Initial Note (Signed)
Transition of Care [facility]) - Initial/Assessment Note    Patient Details  Name: Ashley Camacho MRN: [id number] Date of Birth: 08/04/1990  [facility] CM/SW Contact:    Marlowe Sax, RN Phone Number: 02/17/2023, 1:15 PM  Clinical Narrative:                 Clinical Narrative:      Per review of the chart  She notes that her 32-year-old is very rambunctious and frequently grabs at her J-tube anytime she tries to use it.  In addition, her toddler sleeps with her and when she turns on the machine at night, wakes up her toddler.  The longest she can do tube feeds at home is 4 hours and she has only been able to do this twice in the last week.  She notes that she does not have much support from her ex-husband, with whom she still resides, nor her mother who lives nearby.  This has been the biggest barrier for her    She has been provided resources for tube feeds by [facility] during previous admission She has been provided transportation assistance with Medicaid resource at previous admission, She was provided this again [facility] assisted her into getting into a group home during previous admission then she declined TOC to continue to follow for needs   Barriers to Discharge: Continued Medical Work up   Expected Discharge Plan and Services Discharge Planning Services: CM Consult Living arrangements for the past 2 months: Single Family Home                       DME Arranged: N/A DME Agency: NA HH Arranged: NA     Social Determinants of Health (SDOH) Interventions SDOH Screenings       Food Insecurity: No Food Insecurity (02/16/2023)  Housing: Low Risk  (02/16/2023)  Transportation Needs: No Transportation Needs (02/16/2023)  Utilities: Not At Risk (02/16/2023)  Depression (PHQ2-9): Low Risk  (11/11/2020)  Financial Resource Strain: Medium Risk (09/01/2021)    Received from [facility]  Physical Activity: Inactive (08/19/2021)    Received from [facility], [facility]  Social Connections: Unknown (08/19/2021)    Received from [facility]  Tobacco Use: Low Risk  (02/15/2023)         Barriers to Discharge: Continued Medical Work up   Patient Goals and CMS Choice            Expected Discharge Plan and Services   Discharge Planning Services: CM Consult   Living arrangements for the past 2 months: Single Family Home                 DME Arranged: N/A DME Agency: NA       HH Arranged: NA          Prior Living Arrangements/Services Living arrangements for the past 2 months: Single Family Home Lives with:: Self, Other (Comment) (Ex husband) Patient language and need for interpreter reviewed:: Yes Do you feel safe going back to the place where you live?: Yes      Need for Family Participation in Patient Care: No (Comment) Care giver support system in place?: No (comment) Current home services: DME (tube feeding pump and tube feeds) Criminal Activity/Legal Involvement Pertinent to Current Situation/Hospitalization: No - Comment as needed  Activities of Daily Living   ADL Screening (condition at time of admission) Does the patient have a NEW  difficulty with bathing/dressing/toileting/self-feeding that is expected to last >3 days?: No Does the patient have a NEW difficulty with getting in/out of bed, walking, or climbing stairs that is expected to last >3 days?: No Does the patient have a NEW difficulty with communication that is expected to last >3 days?: No Is the patient deaf or have difficulty hearing?: No Does the patient have difficulty seeing, even when wearing glasses/contacts?: No Does the patient have difficulty concentrating, remembering, or making decisions?: No  Permission Sought/Granted   Permission granted to share information with : Yes, Verbal Permission Granted              Emotional Assessment Appearance:: Appears stated  age Attitude/Demeanor/Rapport: Engaged Affect (typically observed): Accepting Orientation: : Oriented to Self, Oriented to Place, Oriented to  Time, Oriented to Situation Alcohol / Substance Use: Not Applicable Psych Involvement: No (comment)  Admission diagnosis:  Dehydration [E86.0] Hypotension [I95.9] Patient Active Problem List   Diagnosis Date Noted   Anemia in chronic kidney disease (CKD) 12/30/2022   AKI (acute kidney injury) (HCC) 11/22/2022   Dislodged jejunostomy tube 11/22/2022   Type 1 diabetes mellitus without complications (HCC) 11/22/2022   DVT (deep venous thrombosis) (HCC) 11/20/2022   Failure to thrive in adult 11/13/2022   Hyperglycemia 11/13/2022   Intractable nausea and vomiting 11/13/2022   Normocytic anemia 11/12/2022   Severe recurrent major depression (HCC) 11/03/2022   CKD stage 3a, GFR 45-59 ml/min (HCC) 11/01/2022   Elevated serum creatinine 11/01/2022   Abdominal pain 11/01/2022   Cystitis 11/01/2022   Acute exacerbation of congestive heart failure (HCC) 08/23/2022   Diabetic ketoacidosis (HCC) 08/23/2022   Hypothermia 07/27/2022   Stage 3b chronic kidney disease (HCC) 06/29/2022   Hypovolemic shock (HCC) 06/28/2022   Jejunostomy tube present (HCC) 06/28/2022   Insomnia 06/20/2022   Hypophosphatemia 06/13/2022   Severe malnutrition (HCC) 06/13/2022   Hypotension 06/10/2022   Pseudohyponatremia 06/10/2022   Hypomagnesemia 06/10/2022   Adrenal insufficiency (HCC) 06/09/2022   Acute gastroenteritis 02/28/2022   Hypokalemia 02/28/2022   Hypocalcemia 02/28/2022   Metabolic acidosis 02/28/2022   Essential hypertension 02/28/2022   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   Nausea & vomiting 02/28/2022   High anion gap metabolic acidosis 01/02/2022   UTI (urinary tract infection) 11/03/2021   Hypoglycemia due to type 1 diabetes mellitus (HCC) 11/02/2021   Thrombocytopenia (HCC) 11/02/2021   Problem related to social environment 09/24/2021    Rhinovirus 09/22/2021   Acute on chronic HFrEF (heart failure with reduced ejection fraction) (HCC) 09/07/2021   Pneumonia due to human metapneumovirus 08/31/2021   Gastro-esophageal reflux disease with esophagitis 08/31/2021   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   Congestive heart failure (HCC) 08/31/2021   Hypoglycemia due to insulin 06/14/2021   DKA (diabetic ketoacidosis) (HCC) 06/14/2021   CKD stage 3 due to type 1 diabetes mellitus (HCC) 06/14/2021   Transaminitis 06/14/2021   Hypoglycemia associated with diabetes (HCC) 06/03/2021   DKA, type 1 (HCC) 05/20/2021   Major depressive disorder, recurrent episode, moderate (HCC) 05/08/2021   Right arm pain 05/07/2021   Rash of hand 05/07/2021   Hyperkalemia 05/06/2021   History of adrenal insufficiency 05/06/2021   CAP (community acquired pneumonia) 05/05/2021   Hypoglycemia 04/27/2021   Combined systolic and diastolic heart failure (HCC) 03/11/2021   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) 03/11/2021   Protein-calorie malnutrition, severe (HCC) 03/11/2021   Adrenal insufficiency (Addison's disease) (HCC) 03/10/2021   Postoperative hematoma involving genitourinary system following genitourinary procedure  S/P cesarean section 02/17/2021   Acute esophagitis    Leg edema    History of preterm delivery, currently pregnant    Pre-existing type 1 diabetes mellitus during pregnancy in third trimester    Anemia of chronic disease    Type 1 diabetes mellitus affecting pregnancy in second trimester, antepartum    HFrEF (heart failure with reduced ejection fraction) (HCC)    Acute deep vein thrombosis (DVT) of brachial vein of right upper extremity (HCC) 12/17/2020   Anxiety 12/17/2020   Diabetic retinopathy (HCC) 12/17/2020   Chronic combined systolic and diastolic CHF (congestive heart failure) (HCC)    Hyperemesis 12/13/2020   Type 1 diabetes mellitus with hypoglycemia without coma (HCC)  12/13/2020   Sunburn 12/13/2020   Hypothyroidism 12/10/2020   Malnutrition (HCC) 12/10/2020   Vitreous hemorrhage of right eye (HCC) 12/10/2020   Pleural effusion associated with pulmonary infection 11/25/2020   Pneumonia affecting pregnancy 11/24/2020   Diabetes mellitus affecting pregnancy, second trimester 11/24/2020   Edema 11/24/2020   Decreased urine output 11/24/2020   Shortness of breath    Zinc deficiency    SOB (shortness of breath)    Social problem 11/21/2020   Nausea and vomiting during pregnancy prior to [redacted] weeks gestation 11/21/2020   Low serum vitamin B12    Delivery with history of C-section 11/20/2020   Absolute anemia    Acute kidney injury superimposed on chronic kidney disease (HCC)    Nausea and vomiting during pregnancy 10/23/2020   History of premature delivery, currently pregnant, second trimester 10/13/2020   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 [place] [address] Phone: [phone] Fax: [phone]     Social Determinants of Health (SDOH) Social History: SDOH Screenings   Food Insecurity: No Food Insecurity (02/16/2023)  Housing: Low Risk  (02/16/2023)  Transportation Needs: No Transportation Needs (02/16/2023)  Utilities: Not At Risk (02/16/2023)  Depression (PHQ2-9): Low Risk  (11/11/2020)  Financial Resource Strain: Medium Risk (09/01/2021)   Received from [facility]  Physical Activity: Inactive (08/19/2021)   Received from [facility], [facility]  Social Connections: Unknown (08/19/2021)   Received from [facility]  Tobacco Use: Low Risk  (02/15/2023)   SDOH Interventions:     Readmission Risk Interventions    11/24/2022   11:04 AM 11/03/2021   10:37 AM 06/16/2021   12:30 PM  Readmission Risk Prevention Plan  Transportation Screening Complete Complete Complete  Medication Review Oceanographer) Complete Complete Complete  PCP or Specialist appointment within 3-5 days of discharge Complete Complete Complete  HRI or Home Care Consult  Not Complete   HRI or Home Care Consult Pt Refusal Comments  NA   SW Recovery Care/Counseling Consult Complete Not Complete Complete  SW Consult Not Complete Comments  NA   Palliative Care Screening Not Applicable Not Applicable Not Applicable  Skilled Nursing Facility Not Applicable Not Applicable Not Applicable

## 2023-02-18 DIAGNOSIS — N179 Acute kidney failure, unspecified: Secondary | ICD-10-CM | POA: Diagnosis not present

## 2023-02-18 DIAGNOSIS — E860 Dehydration: Secondary | ICD-10-CM | POA: Diagnosis not present

## 2023-02-18 LAB — GLUCOSE, CAPILLARY
Glucose-Capillary: 105 mg/dL — ABNORMAL HIGH (ref 70–99)
Glucose-Capillary: 107 mg/dL — ABNORMAL HIGH (ref 70–99)
Glucose-Capillary: 129 mg/dL — ABNORMAL HIGH (ref 70–99)
Glucose-Capillary: 180 mg/dL — ABNORMAL HIGH (ref 70–99)

## 2023-02-18 LAB — CBC WITH DIFFERENTIAL/PLATELET
Abs Immature Granulocytes: 0.01 10*3/uL (ref 0.00–0.07)
Basophils Absolute: 0 10*3/uL (ref 0.0–0.1)
Basophils Relative: 1 %
Eosinophils Absolute: 0.1 10*3/uL (ref 0.0–0.5)
Eosinophils Relative: 2 %
HCT: 21.1 % — ABNORMAL LOW (ref 36.0–46.0)
Hemoglobin: 7.2 g/dL — ABNORMAL LOW (ref 12.0–15.0)
Immature Granulocytes: 0 %
Lymphocytes Relative: 23 %
Lymphs Abs: 0.8 10*3/uL (ref 0.7–4.0)
MCH: 29.8 pg (ref 26.0–34.0)
MCHC: 34.1 g/dL (ref 30.0–36.0)
MCV: 87.2 fL (ref 80.0–100.0)
Monocytes Absolute: 0.2 10*3/uL (ref 0.1–1.0)
Monocytes Relative: 5 %
Neutro Abs: 2.3 10*3/uL (ref 1.7–7.7)
Neutrophils Relative %: 69 %
Platelets: 92 10*3/uL — ABNORMAL LOW (ref 150–400)
RBC: 2.42 MIL/uL — ABNORMAL LOW (ref 3.87–5.11)
RDW: 12.2 % (ref 11.5–15.5)
Smear Review: NORMAL
WBC: 3.3 10*3/uL — ABNORMAL LOW (ref 4.0–10.5)
nRBC: 0 % (ref 0.0–0.2)

## 2023-02-18 LAB — BASIC METABOLIC PANEL
Anion gap: 5 (ref 5–15)
BUN: 25 mg/dL — ABNORMAL HIGH (ref 6–20)
CO2: 25 mmol/L (ref 22–32)
Calcium: 8.1 mg/dL — ABNORMAL LOW (ref 8.9–10.3)
Chloride: 111 mmol/L (ref 98–111)
Creatinine, Ser: 1.3 mg/dL — ABNORMAL HIGH (ref 0.44–1.00)
GFR, Estimated: 56 mL/min — ABNORMAL LOW (ref >60)
Glucose, Bld: 132 mg/dL — ABNORMAL HIGH (ref 70–99)
Potassium: 4.9 mmol/L (ref 3.5–5.1)
Sodium: 141 mmol/L (ref 135–145)

## 2023-02-18 MED ORDER — PROMETHAZINE HCL 25 MG PO TABS: 12.5000 mg | ORAL_TABLET | Freq: Four times a day (QID) | ORAL | 0 refills | Status: DC | PRN

## 2023-02-18 NOTE — Plan of Care (Signed)
  Problem: Education: Goal: Ability to describe self-care measures that may prevent or decrease complications (Diabetes Survival Skills Education) will improve Outcome: Progressing Goal: Individualized Educational Video(s) Outcome: Progressing  Problem: Health Behavior/Discharge Planning: Goal: Ability to identify and utilize available resources and services will improve Outcome: Progressing

## 2023-02-18 NOTE — Discharge Summary (Signed)
Physician Discharge Summary   Patient: Ashley Camacho MRN: [id number] DOB: [birth year]  Admit date:     02/15/2023  Discharge date: 02/18/23  Discharge Physician: Loyce Dys   PCP: Pcp, No   Recommendations at discharge:  Follow-up with your primary care physician, endocrinologist as well as your hematologist concerning your pancytopenia  Discharge Diagnoses:  Hypotension-improved Intractable nausea and vomiting Acute kidney injury superimposed on chronic kidney disease (HCC) Type 1 diabetes mellitus without complications (HCC) Gastroparesis Severe malnutrition (HCC) Adrenal insufficiency (HCC) Anemia of chronic disease Congestive heart failure [facility])   Hospital Course: Ashley Camacho is a 32 y.o. female with medical history significant of type 1 diabetes, adrenal insufficiency, gastroparesis, protein malnutrition s/p J-tube placement on nighttime tube feeds, normocytic anemia, CKD stage IIIb who presents to the ED due to nausea and vomiting.   Ashley Camacho states that she has been experiencing nausea and vomiting that has been intractable for approximately 1 week now.  She has not been experiencing any abdominal pain.  Due to poor p.o. intake, she began to experience dizziness that particularly occurred when she would change positions.  She endorses feeling generally weak and dehydrated.  Ashley Camacho states that she has not been able to utilize her nighttime tube feeds.  She notes that her 30-year-old is very rambunctious and frequently grabs at her J-tube anytime she tries to use it.  In addition, her toddler sleeps with her and when she turns on the machine at night, wakes up her toddler.  The longest she can do tube feeds at home is 4 hours and she has only been able to do this twice in the last week.  She notes that she does not have much support from her ex-husband, with whom she still resides, nor her mother who lives nearby.  This has been the biggest barrier for her.  Diabetic  coordinator as well as social services got consulted.  We have offered the patient a referral to be able to follow-up with endocrinologist.  Patient has history of noncompliance and known follow-up.  We have discussed this with the patient and she agrees to follow-up.   Consultants: None Procedures performed: None Disposition: Home Diet recommendation:  Discharge Diet Orders (From admission, onward)     Start     Ordered   02/18/23 0000  Diet - low sodium heart healthy        02/18/23 1050           Carb modified diet DISCHARGE MEDICATION: Allergies as of 02/18/2023   No Known Allergies      Medication List     STOP taking these medications    amLODipine 10 MG tablet Commonly known as: NORVASC   methocarbamol 500 MG tablet Commonly known as: ROBAXIN       TAKE these medications    acetaminophen 500 MG tablet Commonly known as: TYLENOL Take 2 tablets (1,000 mg total) by mouth every 6 (six) hours as needed for headache, fever or moderate pain.   Basaglar KwikPen 100 UNIT/ML Inject 5 Units into the skin 2 (two) times daily.   divalproex 250 MG 24 hr tablet Commonly known as: DEPAKOTE ER Take 1 tablet (250 mg total) by mouth daily.   feeding supplement (GLUCERNA 1.5 CAL) Liqd Place 640 mLs into feeding tube at bedtime.   FLUoxetine 10 MG capsule Commonly known as: PROZAC Take 1 capsule (10 mg total) by mouth daily.   hydrocortisone 10 MG tablet Commonly known as: CORTEF Take  2 tablets (20 mg total) by mouth daily with breakfast AND 1 tablet (10 mg total) daily with supper.   insulin aspart 100 UNIT/ML injection Commonly known as: NovoLOG Inject 2 Units into the skin 3 (three) times daily with meals.   insulin aspart 100 UNIT/ML injection Commonly known as: novoLOG Inject 0-6 Units into the skin 4 (four) times daily - after meals and at bedtime.   levothyroxine 50 MCG tablet Commonly known as: SYNTHROID Take 1 tablet (50 mcg total) by mouth daily at  6 (six) AM.   loperamide 2 MG capsule Commonly known as: IMODIUM Take 2 capsules (4 mg total) by mouth as needed for diarrhea or loose stools.   multivitamin with minerals Tabs tablet Take 1 tablet by mouth daily.   promethazine 25 MG tablet Commonly known as: PHENERGAN Take 0.5 tablets (12.5 mg total) by mouth every 6 (six) hours as needed for up to 10 days for nausea or vomiting.   zinc gluconate 50 MG tablet Take 1 tablet (50 mg total) by mouth daily.        Discharge Exam: Filed Weights   02/16/23 0500 02/17/23 0500 02/18/23 0500  Weight: 43.1 kg 44.8 kg 47.2 kg    Conjunctiva/sclera: Conjunctivae normal.     Pupils: Pupils are equal, round, and reactive to light.  Cardiovascular:     Rate and Rhythm: Normal rate and regular rhythm.     Heart sounds: No murmur heard.    No gallop.  Pulmonary:     Effort: Pulmonary effort is normal. No respiratory distress.     Breath sounds: Normal breath sounds.  Abdominal:     General: Bowel sounds are normal.     Tenderness: There is no abdominal tenderness. There is no guarding.     Comments: j-tube in place Musculoskeletal:     Right lower leg: No edema.  Neurological:     Mental Status: She is alert and oriented to person, place, and time. Mental status is at baseline.  Psychiatric:    Normal mood  Condition at discharge: good     Discharge time spent:  35 minutes.  Signed: Loyce Dys, MD [facility] 02/18/2023

## 2023-02-18 NOTE — Plan of Care (Signed)
  Problem: Nutritional: Goal: Maintenance of adequate nutrition will improve Outcome: Progressing Goal: Maintenance of adequate weight for body size and type will improve Outcome: Progressing   Problem: Metabolic: Goal: Ability to maintain appropriate glucose levels will improve Outcome: Progressing

## 2023-02-22 DIAGNOSIS — Z419 Encounter for procedure for purposes other than remedying health state, unspecified: Secondary | ICD-10-CM | POA: Diagnosis not present

## 2023-03-06 DIAGNOSIS — K3184 Gastroparesis: Secondary | ICD-10-CM | POA: Diagnosis not present

## 2023-03-06 DIAGNOSIS — E109 Type 1 diabetes mellitus without complications: Secondary | ICD-10-CM | POA: Diagnosis not present

## 2023-03-06 DIAGNOSIS — N1832 Chronic kidney disease, stage 3b: Secondary | ICD-10-CM | POA: Diagnosis not present

## 2023-03-06 DIAGNOSIS — E440 Moderate protein-calorie malnutrition: Secondary | ICD-10-CM | POA: Diagnosis not present

## 2023-03-06 DIAGNOSIS — Z931 Gastrostomy status: Secondary | ICD-10-CM | POA: Diagnosis not present

## 2023-03-22 ENCOUNTER — Emergency Department: Payer: Medicaid Other

## 2023-03-22 ENCOUNTER — Other Ambulatory Visit: Payer: Self-pay

## 2023-03-22 ENCOUNTER — Inpatient Hospital Stay
Admission: EM | Admit: 2023-03-22 | Discharge: 2023-03-26 | DRG: 638 | Disposition: A | Discharge status: Home / Self Care | Payer: Medicaid Other | Attending: Internal Medicine | Admitting: Internal Medicine

## 2023-03-22 DIAGNOSIS — R112 Nausea with vomiting, unspecified: Secondary | ICD-10-CM | POA: Diagnosis present

## 2023-03-22 DIAGNOSIS — I10 Essential (primary) hypertension: Secondary | ICD-10-CM | POA: Diagnosis present

## 2023-03-22 DIAGNOSIS — D631 Anemia in chronic kidney disease: Secondary | ICD-10-CM | POA: Diagnosis present

## 2023-03-22 DIAGNOSIS — Z833 Family history of diabetes mellitus: Secondary | ICD-10-CM

## 2023-03-22 DIAGNOSIS — Z1152 Encounter for screening for COVID-19: Secondary | ICD-10-CM

## 2023-03-22 DIAGNOSIS — Z86718 Personal history of other venous thrombosis and embolism: Secondary | ICD-10-CM

## 2023-03-22 DIAGNOSIS — Z934 Other artificial openings of gastrointestinal tract status: Secondary | ICD-10-CM

## 2023-03-22 DIAGNOSIS — E1043 Type 1 diabetes mellitus with diabetic autonomic (poly)neuropathy: Secondary | ICD-10-CM | POA: Diagnosis present

## 2023-03-22 DIAGNOSIS — Z681 Body mass index (BMI) 19 or less, adult: Secondary | ICD-10-CM

## 2023-03-22 DIAGNOSIS — D61818 Other pancytopenia: Secondary | ICD-10-CM | POA: Diagnosis present

## 2023-03-22 DIAGNOSIS — E1069 Type 1 diabetes mellitus with other specified complication: Secondary | ICD-10-CM | POA: Diagnosis not present

## 2023-03-22 DIAGNOSIS — E871 Hypo-osmolality and hyponatremia: Secondary | ICD-10-CM | POA: Diagnosis present

## 2023-03-22 DIAGNOSIS — I130 Hypertensive heart and chronic kidney disease with heart failure and stage 1 through stage 4 chronic kidney disease, or unspecified chronic kidney disease: Secondary | ICD-10-CM | POA: Diagnosis present

## 2023-03-22 DIAGNOSIS — I5042 Chronic combined systolic (congestive) and diastolic (congestive) heart failure: Secondary | ICD-10-CM | POA: Diagnosis present

## 2023-03-22 DIAGNOSIS — E039 Hypothyroidism, unspecified: Secondary | ICD-10-CM | POA: Diagnosis present

## 2023-03-22 DIAGNOSIS — K8689 Other specified diseases of pancreas: Secondary | ICD-10-CM | POA: Diagnosis present

## 2023-03-22 DIAGNOSIS — Z8249 Family history of ischemic heart disease and other diseases of the circulatory system: Secondary | ICD-10-CM

## 2023-03-22 DIAGNOSIS — K3184 Gastroparesis: Secondary | ICD-10-CM | POA: Diagnosis present

## 2023-03-22 DIAGNOSIS — E860 Dehydration: Secondary | ICD-10-CM | POA: Diagnosis present

## 2023-03-22 DIAGNOSIS — E440 Moderate protein-calorie malnutrition: Secondary | ICD-10-CM | POA: Insufficient documentation

## 2023-03-22 DIAGNOSIS — Z820 Family history of epilepsy and other diseases of the nervous system: Secondary | ICD-10-CM

## 2023-03-22 DIAGNOSIS — E10649 Type 1 diabetes mellitus with hypoglycemia without coma: Secondary | ICD-10-CM | POA: Diagnosis not present

## 2023-03-22 DIAGNOSIS — E1022 Type 1 diabetes mellitus with diabetic chronic kidney disease: Secondary | ICD-10-CM | POA: Diagnosis present

## 2023-03-22 DIAGNOSIS — I82409 Acute embolism and thrombosis of unspecified deep veins of unspecified lower extremity: Secondary | ICD-10-CM | POA: Diagnosis present

## 2023-03-22 DIAGNOSIS — Z79890 Hormone replacement therapy (postmenopausal): Secondary | ICD-10-CM

## 2023-03-22 DIAGNOSIS — D696 Thrombocytopenia, unspecified: Secondary | ICD-10-CM | POA: Diagnosis present

## 2023-03-22 DIAGNOSIS — Z79899 Other long term (current) drug therapy: Secondary | ICD-10-CM

## 2023-03-22 DIAGNOSIS — E1065 Type 1 diabetes mellitus with hyperglycemia: Secondary | ICD-10-CM | POA: Diagnosis present

## 2023-03-22 DIAGNOSIS — N179 Acute kidney failure, unspecified: Secondary | ICD-10-CM | POA: Diagnosis not present

## 2023-03-22 DIAGNOSIS — N189 Chronic kidney disease, unspecified: Secondary | ICD-10-CM | POA: Diagnosis present

## 2023-03-22 DIAGNOSIS — Z803 Family history of malignant neoplasm of breast: Secondary | ICD-10-CM

## 2023-03-22 DIAGNOSIS — E872 Acidosis, unspecified: Secondary | ICD-10-CM | POA: Diagnosis present

## 2023-03-22 DIAGNOSIS — N1832 Chronic kidney disease, stage 3b: Secondary | ICD-10-CM | POA: Diagnosis present

## 2023-03-22 DIAGNOSIS — Z794 Long term (current) use of insulin: Secondary | ICD-10-CM

## 2023-03-22 DIAGNOSIS — E870 Hyperosmolality and hypernatremia: Secondary | ICD-10-CM | POA: Diagnosis not present

## 2023-03-22 DIAGNOSIS — R9431 Abnormal electrocardiogram [ECG] [EKG]: Secondary | ICD-10-CM | POA: Diagnosis not present

## 2023-03-22 DIAGNOSIS — R531 Weakness: Principal | ICD-10-CM

## 2023-03-22 DIAGNOSIS — R059 Cough, unspecified: Secondary | ICD-10-CM | POA: Diagnosis not present

## 2023-03-22 DIAGNOSIS — E875 Hyperkalemia: Secondary | ICD-10-CM | POA: Diagnosis present

## 2023-03-22 DIAGNOSIS — Z801 Family history of malignant neoplasm of trachea, bronchus and lung: Secondary | ICD-10-CM

## 2023-03-22 DIAGNOSIS — R29818 Other symptoms and signs involving the nervous system: Secondary | ICD-10-CM | POA: Diagnosis not present

## 2023-03-22 DIAGNOSIS — R519 Headache, unspecified: Secondary | ICD-10-CM | POA: Diagnosis not present

## 2023-03-22 DIAGNOSIS — R739 Hyperglycemia, unspecified: Secondary | ICD-10-CM

## 2023-03-22 DIAGNOSIS — D649 Anemia, unspecified: Secondary | ICD-10-CM | POA: Diagnosis present

## 2023-03-22 DIAGNOSIS — E2740 Unspecified adrenocortical insufficiency: Secondary | ICD-10-CM | POA: Diagnosis present

## 2023-03-22 LAB — OSMOLALITY: Osmolality: 326 mosm/kg (ref 275–295)

## 2023-03-22 LAB — BASIC METABOLIC PANEL
Anion gap: 10 (ref 5–15)
BUN: 36 mg/dL — ABNORMAL HIGH (ref 6–20)
CO2: 21 mmol/L — ABNORMAL LOW (ref 22–32)
Calcium: 8.5 mg/dL — ABNORMAL LOW (ref 8.9–10.3)
Chloride: 101 mmol/L (ref 98–111)
Creatinine, Ser: 2 mg/dL — ABNORMAL HIGH (ref 0.44–1.00)
GFR, Estimated: 34 mL/min — ABNORMAL LOW (ref >60)
Glucose, Bld: 595 mg/dL (ref 70–99)
Potassium: 5.3 mmol/L — ABNORMAL HIGH (ref 3.5–5.1)
Sodium: 132 mmol/L — ABNORMAL LOW (ref 135–145)

## 2023-03-22 LAB — CBC
HCT: 23.1 % — ABNORMAL LOW (ref 36.0–46.0)
Hemoglobin: 7.8 g/dL — ABNORMAL LOW (ref 12.0–15.0)
MCH: 29.7 pg (ref 26.0–34.0)
MCHC: 33.8 g/dL (ref 30.0–36.0)
MCV: 87.8 fL (ref 80.0–100.0)
Platelets: 132 10*3/uL — ABNORMAL LOW (ref 150–400)
RBC: 2.63 MIL/uL — ABNORMAL LOW (ref 3.87–5.11)
RDW: 11.9 % (ref 11.5–15.5)
WBC: 3.6 10*3/uL — ABNORMAL LOW (ref 4.0–10.5)
nRBC: 0 % (ref 0.0–0.2)

## 2023-03-22 LAB — COMPREHENSIVE METABOLIC PANEL
ALT: 17 U/L (ref 0–44)
AST: 15 U/L (ref 15–41)
Albumin: 4.2 g/dL (ref 3.5–5.0)
Alkaline Phosphatase: 89 U/L (ref 38–126)
Anion gap: 13 (ref 5–15)
BUN: 38 mg/dL — ABNORMAL HIGH (ref 6–20)
CO2: 20 mmol/L — ABNORMAL LOW (ref 22–32)
Calcium: 9 mg/dL (ref 8.9–10.3)
Chloride: 97 mmol/L — ABNORMAL LOW (ref 98–111)
Creatinine, Ser: 2.19 mg/dL — ABNORMAL HIGH (ref 0.44–1.00)
GFR, Estimated: 30 mL/min — ABNORMAL LOW (ref >60)
Glucose, Bld: 714 mg/dL (ref 70–99)
Potassium: 5.8 mmol/L — ABNORMAL HIGH (ref 3.5–5.1)
Sodium: 130 mmol/L — ABNORMAL LOW (ref 135–145)
Total Bilirubin: 1.2 mg/dL (ref 0.3–1.2)
Total Protein: 7.5 g/dL (ref 6.5–8.1)

## 2023-03-22 LAB — CBC WITH DIFFERENTIAL/PLATELET
Abs Immature Granulocytes: 0.01 10*3/uL (ref 0.00–0.07)
Basophils Absolute: 0 10*3/uL (ref 0.0–0.1)
Basophils Relative: 1 %
Eosinophils Absolute: 0.2 10*3/uL (ref 0.0–0.5)
Eosinophils Relative: 5 %
HCT: 23 % — ABNORMAL LOW (ref 36.0–46.0)
Hemoglobin: 7.5 g/dL — ABNORMAL LOW (ref 12.0–15.0)
Immature Granulocytes: 0 %
Lymphocytes Relative: 31 %
Lymphs Abs: 1 10*3/uL (ref 0.7–4.0)
MCH: 29.2 pg (ref 26.0–34.0)
MCHC: 32.6 g/dL (ref 30.0–36.0)
MCV: 89.5 fL (ref 80.0–100.0)
Monocytes Absolute: 0.2 10*3/uL (ref 0.1–1.0)
Monocytes Relative: 6 %
Neutro Abs: 1.9 10*3/uL (ref 1.7–7.7)
Neutrophils Relative %: 57 %
Platelets: 120 10*3/uL — ABNORMAL LOW (ref 150–400)
RBC: 2.57 MIL/uL — ABNORMAL LOW (ref 3.87–5.11)
RDW: 12 % (ref 11.5–15.5)
WBC: 3.3 10*3/uL — ABNORMAL LOW (ref 4.0–10.5)
nRBC: 0 % (ref 0.0–0.2)

## 2023-03-22 LAB — TSH: TSH: 4.706 u[IU]/mL — ABNORMAL HIGH (ref 0.350–4.500)

## 2023-03-22 LAB — CBG MONITORING, ED
Glucose-Capillary: 538 mg/dL (ref 70–99)
Glucose-Capillary: 485 mg/dL — ABNORMAL HIGH (ref 70–99)

## 2023-03-22 LAB — ACETAMINOPHEN LEVEL: Acetaminophen (Tylenol), Serum: 13 ug/mL (ref 10–30)

## 2023-03-22 LAB — MAGNESIUM
Magnesium: 2.4 mg/dL (ref 1.7–2.4)
Magnesium: 2.1 mg/dL (ref 1.7–2.4)

## 2023-03-22 LAB — TROPONIN I (HIGH SENSITIVITY)
Troponin I (High Sensitivity): 3 ng/L (ref <18)
Troponin I (High Sensitivity): 2 ng/L (ref <18)

## 2023-03-22 LAB — RESP PANEL BY RT-PCR (RSV, FLU A&B, COVID)  RVPGX2
Influenza A by PCR: NEGATIVE
Influenza B by PCR: NEGATIVE
Resp Syncytial Virus by PCR: NEGATIVE
SARS Coronavirus 2 by RT PCR: NEGATIVE

## 2023-03-22 LAB — LIPASE, BLOOD: Lipase: 21 U/L (ref 11–51)

## 2023-03-22 LAB — T4, FREE: Free T4: 0.67 ng/dL (ref 0.61–1.12)

## 2023-03-22 MED ORDER — BASAGLAR KWIKPEN 100 UNIT/ML ~~LOC~~ SOPN: 5.0000 [IU] | PEN_INJECTOR | Freq: Two times a day (BID) | SUBCUTANEOUS | Status: DC

## 2023-03-22 MED ORDER — INSULIN REGULAR(HUMAN) IN NACL 100-0.9 UT/100ML-% IV SOLN
INTRAVENOUS | Status: DC
  Administered 2023-03-22: 3.8 [IU]/h via INTRAVENOUS
  Filled 2023-03-22: qty 100

## 2023-03-22 MED ORDER — FLUOXETINE HCL 10 MG PO CAPS
10.0000 mg | ORAL_CAPSULE | Freq: Every day | ORAL | Status: DC
  Administered 2023-03-23 – 2023-03-26 (×4): 10 mg via ORAL
  Filled 2023-03-22 (×4): qty 1

## 2023-03-22 MED ORDER — ONDANSETRON HCL 4 MG PO TABS: 4.0000 mg | ORAL_TABLET | Freq: Four times a day (QID) | ORAL | Status: DC | PRN

## 2023-03-22 MED ORDER — DIVALPROEX SODIUM ER 250 MG PO TB24
250.0000 mg | ORAL_TABLET | Freq: Every day | ORAL | Status: DC
  Administered 2023-03-23 – 2023-03-26 (×4): 250 mg via ORAL
  Filled 2023-03-22 (×4): qty 1

## 2023-03-22 MED ORDER — SODIUM CHLORIDE 0.9% FLUSH
3.0000 mL | Freq: Two times a day (BID) | INTRAVENOUS | Status: DC
Start: 2023-03-23 — End: 2023-03-26
  Administered 2023-03-23 – 2023-03-26 (×6): 3 mL via INTRAVENOUS

## 2023-03-22 MED ORDER — DEXTROSE 50 % IV SOLN
0.0000 mL | INTRAVENOUS | Status: DC | PRN
  Administered 2023-03-24: 50 mL via INTRAVENOUS
  Filled 2023-03-22: qty 50

## 2023-03-22 MED ORDER — HYDROCORTISONE 10 MG PO TABS
20.0000 mg | ORAL_TABLET | Freq: Every day | ORAL | Status: DC
  Administered 2023-03-23 – 2023-03-26 (×4): 20 mg via ORAL
  Filled 2023-03-22 (×5): qty 2

## 2023-03-22 MED ORDER — ONDANSETRON HCL 4 MG/2ML IJ SOLN
4.0000 mg | Freq: Once | INTRAMUSCULAR | Status: AC
  Administered 2023-03-22: 4 mg via INTRAVENOUS
  Filled 2023-03-22: qty 2

## 2023-03-22 MED ORDER — HYDROCODONE-ACETAMINOPHEN 5-325 MG PO TABS: 1.0000 | ORAL_TABLET | ORAL | Status: DC | PRN

## 2023-03-22 MED ORDER — SODIUM CHLORIDE 0.9 % IV BOLUS
1000.0000 mL | Freq: Once | INTRAVENOUS | Status: AC
  Administered 2023-03-22: 1000 mL via INTRAVENOUS

## 2023-03-22 MED ORDER — MORPHINE SULFATE (PF) 2 MG/ML IV SOLN: 2.0000 mg | INTRAVENOUS | Status: DC | PRN

## 2023-03-22 MED ORDER — ONDANSETRON HCL 4 MG/2ML IJ SOLN
4.0000 mg | Freq: Four times a day (QID) | INTRAMUSCULAR | Status: DC | PRN
  Administered 2023-03-23 – 2023-03-25 (×4): 4 mg via INTRAVENOUS
  Filled 2023-03-22 (×4): qty 2

## 2023-03-22 MED ORDER — SODIUM CHLORIDE 0.9% FLUSH
10.0000 mL | Freq: Two times a day (BID) | INTRAVENOUS | Status: DC
Start: 2023-03-23 — End: 2023-03-24
  Administered 2023-03-23 (×2): 10 mL via INTRAVENOUS

## 2023-03-22 MED ORDER — ACETAMINOPHEN 650 MG RE SUPP: 650.0000 mg | Freq: Four times a day (QID) | RECTAL | Status: DC | PRN

## 2023-03-22 MED ORDER — LEVOTHYROXINE SODIUM 50 MCG PO TABS
50.0000 ug | ORAL_TABLET | Freq: Every day | ORAL | Status: DC
Start: 2023-03-23 — End: 2023-03-23

## 2023-03-22 MED ORDER — HEPARIN SODIUM (PORCINE) 5000 UNIT/ML IJ SOLN: 5000.0000 [IU] | Freq: Three times a day (TID) | INTRAMUSCULAR | Status: DC

## 2023-03-22 MED ORDER — ZINC SULFATE 220 (50 ZN) MG PO CAPS
220.0000 mg | ORAL_CAPSULE | Freq: Every day | ORAL | Status: DC
  Administered 2023-03-23 – 2023-03-26 (×4): 220 mg via ORAL
  Filled 2023-03-22 (×4): qty 1

## 2023-03-22 MED ORDER — HYDROCORTISONE 10 MG PO TABS
10.0000 mg | ORAL_TABLET | Freq: Every day | ORAL | Status: DC
  Administered 2023-03-24 – 2023-03-25 (×2): 10 mg via ORAL
  Filled 2023-03-22 (×5): qty 1

## 2023-03-22 MED ORDER — ACETAMINOPHEN 325 MG PO TABS: 650.0000 mg | ORAL_TABLET | Freq: Four times a day (QID) | ORAL | Status: DC | PRN

## 2023-03-22 NOTE — H&P (Signed)
History and Physical    Patient: Ashley Camacho VHQ:[id number] DOB: [birth year] DOA: 03/22/2023 DOS: the patient was seen and examined on 03/23/2023 PCP: Pcp, No  Patient coming from: Home  Chief Complaint:  Chief Complaint  Patient presents with   Weakness    HPI: Ashley Camacho is a 32 y.o. female with medical history significant for insulin-dependent diabetes mellitus type 1, severe gastroparesis, severe malnutrition, status post jejunostomy tube feed, adrenal insufficiency, CKD stage IIIb, hypertension, hypothyroid, coming for generalized weakness.  Patient is alert awake oriented states that she has been having upper respiratory symptoms of congestion chills runny nose, 3 days she has been taking NyQuil.  When she woke up had difficulty remembering. In ed pt was found to be hyperglycemic. She did say that her two year old got sick first and then she got it. No fever but had chills.   In emergency room vitals trend shows: Vitals:   03/22/23 2030 03/22/23 2130 03/22/23 2200 03/22/23 2230  BP: 113/75 124/73 135/76 137/77  Pulse: 74 77 73 73  Temp:      Resp: 17 19 15 12   Height:      Weight:      SpO2: 100% 100% 99% 100%  TempSrc:      BMI (Calculated):      Labs are notable for : -Metabolic panel shows hyponatremia of 130 potassium 5.8 bicarb 20 normal anion gap of 13 glucose 714 AKI of 2.19 normal LFTs. -Initial troponin of 3. -CBC shows pancytopenia with a white count of 3.6 hemoglobin of 7.8 and platelets of 132. -TSH ordered is 4.706 and free T4 at 0.67. -Head CT without contrast shows no intracranial abnormality. -Chest x-ray done today is negative for any acute cardiopulmonary abnormalities.  In the ED pt received: Medications  insulin regular, human (MYXREDLIN) 100 units/ 100 mL infusion (3.8 Units/hr Intravenous Rate/Dose Verify 03/22/23 2359)  dextrose 50 % solution 0-50 mL (has no administration in time range)  divalproex (DEPAKOTE ER) 24 hr tablet 250 mg (has  no administration in time range)  FLUoxetine (PROZAC) capsule 10 mg (has no administration in time range)  hydrocortisone (CORTEF) tablet 20 mg (has no administration in time range)    And  hydrocortisone (CORTEF) tablet 10 mg (has no administration in time range)  zinc sulfate capsule 220 mg (has no administration in time range)  sodium chloride flush (NS) 0.9 % injection 3 mL (3 mLs Intravenous Given 03/23/23 0000)  acetaminophen (TYLENOL) tablet 650 mg (has no administration in time range)    Or  acetaminophen (TYLENOL) suppository 650 mg (has no administration in time range)  HYDROcodone-acetaminophen (NORCO/VICODIN) 5-325 MG per tablet 1 tablet (has no administration in time range)  morphine (PF) 2 MG/ML injection 2 mg (has no administration in time range)  sodium chloride flush (NS) 0.9 % injection 10 mL (10 mLs Intravenous Given 03/23/23 0000)  ondansetron (ZOFRAN) tablet 4 mg (has no administration in time range)    Or  ondansetron (ZOFRAN) injection 4 mg (has no administration in time range)  0.9 %  sodium chloride infusion (has no administration in time range)  sodium chloride 0.9 % bolus 250 mL (has no administration in time range)  pantoprazole (PROTONIX) injection 40 mg (has no administration in time range)  levothyroxine (SYNTHROID) tablet 75 mcg (has no administration in time range)  sodium chloride 0.9 % bolus 1,000 mL (0 mLs Intravenous Stopped 03/22/23 2230)  ondansetron (ZOFRAN) injection 4 mg (4 mg Intravenous Given 03/22/23 2107)  Review of Systems  Constitutional:  Positive for chills and malaise/fatigue.  HENT:  Positive for congestion, sinus pain and sore throat.   Respiratory:  Positive for cough.   Neurological:  Positive for weakness.   Past Medical History:  Diagnosis Date   Acute metabolic encephalopathy 06/10/2022   Adrenal insufficiency (HCC)    Anemia    CHF (congestive heart failure) (HCC)    Gastroparesis    Hypertension    Intractable nausea  and vomiting 11/13/2022   Type 1 diabetes [facility])    Past Surgical History:  Procedure Laterality Date   BIOPSY  01/14/2021   Procedure: BIOPSY;  Surgeon: Tressia Danas, MD;  Location: [facility] ENDOSCOPY;  Service: Gastroenterology;;   CESAREAN SECTION     CESAREAN SECTION WITH BILATERAL TUBAL LIGATION N/A 02/17/2021   Procedure: CESAREAN SECTION WITH BILATERAL TUBAL LIGATION;  Surgeon: [name], DO;  Location: [facility] LD ORS;  Service: Obstetrics;  Laterality: N/A;   ESOPHAGOGASTRODUODENOSCOPY (EGD) WITH PROPOFOL N/A 01/14/2021   Procedure: ESOPHAGOGASTRODUODENOSCOPY (EGD) WITH PROPOFOL;  Surgeon: Tressia Danas, MD;  Location: [facility] ENDOSCOPY;  Service: Gastroenterology;  Laterality: N/A;   IR BONE MARROW BIOPSY & ASPIRATION  12/16/2022   IR REPLC DUODEN/JEJUNO TUBE PERCUT W/FLUORO  11/22/2022   JEJUNOSTOMY N/A 06/13/2022   Procedure: Rance Muir;  Surgeon: Leafy Ro, MD;  Location: [facility] ORS;  Service: General;  Laterality: N/A;   MOUTH SURGERY      reports that she has never smoked. She has never been exposed to tobacco smoke. She has never used smokeless tobacco. She reports that she does not drink alcohol and does not use drugs.  No Known Allergies  Family History  Problem Relation Age of Onset   Breast cancer Mother    Seizures Mother    Diabetes type I Father    CAD Father    Lung cancer Maternal Grandfather    CAD Paternal Grandmother    CAD Paternal Grandfather    Ovarian cancer Neg Hx     Prior to Admission medications   Medication Sig Start Date End Date Taking? Authorizing Provider  acetaminophen (TYLENOL) 500 MG tablet Take 2 tablets (1,000 mg total) by mouth every 6 (six) hours as needed for headache, fever or moderate pain. 08/05/22   Loyce Dys, MD  divalproex (DEPAKOTE ER) 250 MG 24 hr tablet Take 1 tablet (250 mg total) by mouth daily. 08/05/22   Loyce Dys, MD  FLUoxetine (PROZAC) 10 MG capsule Take 1 capsule (10 mg total) by mouth daily. 08/05/22 12/30/22   Loyce Dys, MD  hydrocortisone (CORTEF) 10 MG tablet Take 2 tablets (20 mg total) by mouth daily with breakfast AND 1 tablet (10 mg total) daily with supper. 08/05/22   Loyce Dys, MD  insulin aspart (NOVOLOG) 100 UNIT/ML injection Inject 2 Units into the skin 3 (three) times daily with meals. 08/05/22 02/15/23  Loyce Dys, MD  insulin aspart (NOVOLOG) 100 UNIT/ML injection Inject 0-6 Units into the skin 4 (four) times daily - after meals and at bedtime. 11/15/22   Leeroy Bock, MD  Insulin Glargine [facility]) 100 UNIT/ML Inject 5 Units into the skin 2 (two) times daily. 11/15/22 05/22/23  Leeroy Bock, MD  levothyroxine (SYNTHROID) 50 MCG tablet Take 1 tablet (50 mcg total) by mouth daily at 6 (six) AM. 08/05/22 12/30/22  Loyce Dys, MD  loperamide (IMODIUM) 2 MG capsule Take 2 capsules (4 mg total) by mouth as needed for diarrhea or loose  stools. 11/15/22   Leeroy Bock, MD  Multiple Vitamin (MULTIVITAMIN WITH MINERALS) TABS tablet Take 1 tablet by mouth daily. 11/16/22   Leeroy Bock, MD  Nutritional Supplements (FEEDING SUPPLEMENT, GLUCERNA 1.5 CAL,) LIQD Place 640 mLs into feeding tube at bedtime. 11/15/22   Leeroy Bock, MD  promethazine (PHENERGAN) 25 MG tablet Take 0.5 tablets (12.5 mg total) by mouth every 6 (six) hours as needed for up to 10 days for nausea or vomiting. 02/18/23 02/28/23  Loyce Dys, MD  zinc gluconate 50 MG tablet Take 1 tablet (50 mg total) by mouth daily. 11/20/22   Rickard Patience, MD     Vitals:   03/22/23 2030 03/22/23 2130 03/22/23 2200 03/22/23 2230  BP: 113/75 124/73 135/76 137/77  Pulse: 74 77 73 73  Resp: 17 19 15 12   Temp:      TempSrc:      SpO2: 100% 100% 99% 100%  Weight:      Height:       Physical Exam Vitals and nursing note reviewed.  Constitutional:      General: She is not in acute distress.    Comments: Thin.   HENT:     Head: Normocephalic and atraumatic.     Right Ear: Hearing normal.      Left Ear: Hearing normal.     Nose: Nose normal. No nasal deformity.     Mouth/Throat:     Lips: Pink.     Tongue: No lesions.     Pharynx: Oropharynx is clear.  Eyes:     General: Lids are normal.     Extraocular Movements: Extraocular movements intact.  Cardiovascular:     Rate and Rhythm: Normal rate and regular rhythm.     Heart sounds: Normal heart sounds.  Pulmonary:     Effort: Pulmonary effort is normal.     Breath sounds: Normal breath sounds.  Abdominal:     General: Bowel sounds are normal. There is no distension.     Palpations: Abdomen is soft. There is no mass.     Tenderness: There is no abdominal tenderness.  Musculoskeletal:     Right lower leg: No edema.     Left lower leg: No edema.  Skin:    General: Skin is warm.  Neurological:     General: No focal deficit present.     Mental Status: She is alert and oriented to person, place, and time.     Cranial Nerves: Cranial nerves 2-12 are intact.  Psychiatric:        Attention and Perception: Attention normal.        Mood and Affect: Mood normal.        Speech: Speech normal.        Behavior: Behavior normal. Behavior is cooperative.    Labs on Admission: I have personally reviewed following labs and imaging studies Results for orders placed or performed during the hospital encounter of 03/22/23 (from the past 24 hour(s))  CBC     Status: Abnormal   Collection Time: 03/22/23  8:29 PM  Result Value Ref Range   WBC 3.6 (L) 4.0 - 10.5 K/uL   RBC 2.63 (L) 3.87 - 5.11 MIL/uL   Hemoglobin 7.8 (L) 12.0 - 15.0 g/dL   HCT 25.3 (L) 66.4 - 40.3 %   MCV 87.8 80.0 - 100.0 fL   MCH 29.7 26.0 - 34.0 pg   MCHC 33.8 30.0 - 36.0 g/dL   RDW 47.4 25.9 - 56.3 %  Platelets 132 (L) 150 - 400 K/uL   nRBC 0.0 0.0 - 0.2 %  Comprehensive metabolic panel     Status: Abnormal   Collection Time: 03/22/23  8:29 PM  Result Value Ref Range   Sodium 130 (L) 135 - 145 mmol/L   Potassium 5.8 (H) 3.5 - 5.1 mmol/L   Chloride 97 (L) 98  - 111 mmol/L   CO2 20 (L) 22 - 32 mmol/L   Glucose, Bld 714 (HH) 70 - 99 mg/dL   BUN 38 (H) 6 - 20 mg/dL   Creatinine, Ser 8.29 (H) 0.44 - 1.00 mg/dL   Calcium 9.0 8.9 - 56.2 mg/dL   Total Protein 7.5 6.5 - 8.1 g/dL   Albumin 4.2 3.5 - 5.0 g/dL   AST 15 15 - 41 U/L   ALT 17 0 - 44 U/L   Alkaline Phosphatase 89 38 - 126 U/L   Total Bilirubin 1.2 0.3 - 1.2 mg/dL   GFR, Estimated 30 (L) >60 mL/min   Anion gap 13 5 - 15  Lipase, blood     Status: None   Collection Time: 03/22/23  8:29 PM  Result Value Ref Range   Lipase 21 11 - 51 U/L  Troponin I (High Sensitivity)     Status: None   Collection Time: 03/22/23  8:29 PM  Result Value Ref Range   Troponin I (High Sensitivity) 3 <18 ng/L  Acetaminophen level     Status: None   Collection Time: 03/22/23  8:29 PM  Result Value Ref Range   Acetaminophen (Tylenol), Serum 13 10 - 30 ug/mL  Magnesium     Status: None   Collection Time: 03/22/23  8:29 PM  Result Value Ref Range   Magnesium 2.4 1.7 - 2.4 mg/dL  TSH     Status: Abnormal   Collection Time: 03/22/23  8:29 PM  Result Value Ref Range   TSH 4.706 (H) 0.350 - 4.500 uIU/mL  T4, free     Status: None   Collection Time: 03/22/23  8:29 PM  Result Value Ref Range   Free T4 0.67 0.61 - 1.12 ng/dL  Resp panel by RT-PCR (RSV, Flu A&B, Covid) Anterior Nasal Swab     Status: None   Collection Time: 03/22/23  9:11 PM   Specimen: Anterior Nasal Swab  Result Value Ref Range   SARS Coronavirus 2 by RT PCR NEGATIVE NEGATIVE   Influenza A by PCR NEGATIVE NEGATIVE   Influenza B by PCR NEGATIVE NEGATIVE   Resp Syncytial Virus by PCR NEGATIVE NEGATIVE  CBG monitoring, ED     Status: Abnormal   Collection Time: 03/22/23 10:29 PM  Result Value Ref Range   Glucose-Capillary 538 (HH) 70 - 99 mg/dL  Troponin I (High Sensitivity)     Status: None   Collection Time: 03/22/23 11:01 PM  Result Value Ref Range   Troponin I (High Sensitivity) 2 <18 ng/L  Basic metabolic panel     Status: Abnormal    Collection Time: 03/22/23 11:01 PM  Result Value Ref Range   Sodium 132 (L) 135 - 145 mmol/L   Potassium 5.3 (H) 3.5 - 5.1 mmol/L   Chloride 101 98 - 111 mmol/L   CO2 21 (L) 22 - 32 mmol/L   Glucose, Bld 595 (HH) 70 - 99 mg/dL   BUN 36 (H) 6 - 20 mg/dL   Creatinine, Ser 1.30 (H) 0.44 - 1.00 mg/dL   Calcium 8.5 (L) 8.9 - 10.3 mg/dL   GFR, Estimated 34 (  L) >60 mL/min   Anion gap 10 5 - 15  Osmolality     Status: Abnormal   Collection Time: 03/22/23 11:01 PM  Result Value Ref Range   Osmolality 326 (HH) 275 - 295 mOsm/kg  CBC with Differential (PNL)     Status: Abnormal   Collection Time: 03/22/23 11:01 PM  Result Value Ref Range   WBC 3.3 (L) 4.0 - 10.5 K/uL   RBC 2.57 (L) 3.87 - 5.11 MIL/uL   Hemoglobin 7.5 (L) 12.0 - 15.0 g/dL   HCT 16.1 (L) 09.6 - 04.5 %   MCV 89.5 80.0 - 100.0 fL   MCH 29.2 26.0 - 34.0 pg   MCHC 32.6 30.0 - 36.0 g/dL   RDW 40.9 81.1 - 91.4 %   Platelets 120 (L) 150 - 400 K/uL   nRBC 0.0 0.0 - 0.2 %   Neutrophils Relative % 57 %   Neutro Abs 1.9 1.7 - 7.7 K/uL   Lymphocytes Relative 31 %   Lymphs Abs 1.0 0.7 - 4.0 K/uL   Monocytes Relative 6 %   Monocytes Absolute 0.2 0.1 - 1.0 K/uL   Eosinophils Relative 5 %   Eosinophils Absolute 0.2 0.0 - 0.5 K/uL   Basophils Relative 1 %   Basophils Absolute 0.0 0.0 - 0.1 K/uL   Immature Granulocytes 0 %   Abs Immature Granulocytes 0.01 0.00 - 0.07 K/uL  Magnesium     Status: None   Collection Time: 03/22/23 11:01 PM  Result Value Ref Range   Magnesium 2.1 1.7 - 2.4 mg/dL  CBG monitoring, ED     Status: Abnormal   Collection Time: 03/22/23 11:09 PM  Result Value Ref Range   Glucose-Capillary 485 (H) 70 - 99 mg/dL  CBG monitoring, ED     Status: Abnormal   Collection Time: 03/22/23 11:57 PM  Result Value Ref Range   Glucose-Capillary 434 (H) 70 - 99 mg/dL    CBC: Recent Labs  Lab 03/22/23 2029 03/22/23 2301  WBC 3.6* 3.3*  NEUTROABS  --  1.9  HGB 7.8* 7.5*  HCT 23.1* 23.0*  MCV 87.8 89.5  PLT  132* 120*   Basic Metabolic Panel: Recent Labs  Lab 03/22/23 2029 03/22/23 2301  NA 130* 132*  K 5.8* 5.3*  CL 97* 101  CO2 20* 21*  GLUCOSE 714* 595*  BUN 38* 36*  CREATININE 2.19* 2.00*  CALCIUM 9.0 8.5*  MG 2.4 2.1   GFR: Estimated Creatinine Clearance: 27.7 mL/min (A) (by C-G formula based on SCr of 2 mg/dL (H)). Liver Function Tests: Recent Labs  Lab 03/22/23 2029  AST 15  ALT 17  ALKPHOS 89  BILITOT 1.2  PROT 7.5  ALBUMIN 4.2   Recent Labs  Lab 03/22/23 2029  LIPASE 21   No results for input(s): "AMMONIA" in the last 168 hours. Coagulation Profile: No results for input(s): "INR", "PROTIME" in the last 168 hours. Cardiac Enzymes: No results for input(s): "CKTOTAL", "CKMB", "CKMBINDEX", "TROPONINI" in the last 168 hours. BNP (last 3 results) No results for input(s): "PROBNP" in the last 8760 hours. HbA1C: No results for input(s): "HGBA1C" in the last 72 hours. CBG: Recent Labs  Lab 03/22/23 2229 03/22/23 2309 03/22/23 2357  GLUCAP 538* 485* 434*   Lipid Profile: No results for input(s): "CHOL", "HDL", "LDLCALC", "TRIG", "CHOLHDL", "LDLDIRECT" in the last 72 hours. Thyroid Function Tests: Recent Labs    03/22/23 2029  TSH 4.706*  FREET4 0.67   Anemia Panel: No results for input(s): "VITAMINB12", "  FOLATE", "FERRITIN", "TIBC", "IRON", "RETICCTPCT" in the last 72 hours. Urinalysis    Component Value Date/Time   COLORURINE YELLOW (A) 11/23/2022 1600   APPEARANCEUR CLEAR (A) 11/23/2022 1600   APPEARANCEUR Clear 09/18/2020 1153   LABSPEC 1.009 11/23/2022 1600   LABSPEC 1.026 03/01/2013 1844   PHURINE 8.0 11/23/2022 1600   GLUCOSEU >=500 (A) 11/23/2022 1600   GLUCOSEU >=500 03/01/2013 1844   HGBUR NEGATIVE 11/23/2022 1600   BILIRUBINUR NEGATIVE 11/23/2022 1600   BILIRUBINUR neg 10/23/2020 0953   BILIRUBINUR Negative 09/18/2020 1153   BILIRUBINUR Negative 03/01/2013 1844   KETONESUR NEGATIVE 11/23/2022 1600   PROTEINUR 30 (A) 11/23/2022 1600    UROBILINOGEN 0.2 10/23/2020 0953   NITRITE NEGATIVE 11/23/2022 1600   LEUKOCYTESUR NEGATIVE 11/23/2022 1600   LEUKOCYTESUR Negative 03/01/2013 1844   Unresulted Labs (From admission, onward)     Start     Ordered   03/23/23 0500  CBC with Differential/Platelet  Tomorrow morning,   R        03/22/23 2224   03/23/23 0500  Magnesium  Tomorrow morning,   R        03/22/23 2234   03/23/23 0500  Phosphorus  Tomorrow morning,   R        03/22/23 2234   03/23/23 0037  hCG, quantitative, pregnancy  Add-on,   AD        03/23/23 0036   03/23/23 0032  Basic metabolic panel  (Hyperglycemic Hyperosmolar State (HHS))  STAT Now then every 4 hours ,   STAT      03/23/23 0034   03/23/23 0028  Type and screen  Once,   R        03/23/23 0028   03/22/23 2335  Acetaminophen level  Once,   STAT        03/22/23 2334   03/22/23 1936  Urinalysis, Routine w reflex microscopic -Urine, Clean Catch  Once,   URGENT       Question:  Specimen Source  Answer:  Urine, Clean Catch   03/22/23 1938   Unscheduled  Occult blood card to lab, stool RN will collect  As needed,   TIMED     Question:  Specimen to be collected by:  Answer:  RN will collect   03/23/23 0028            Medications  insulin regular, human (MYXREDLIN) 100 units/ 100 mL infusion (3.8 Units/hr Intravenous Rate/Dose Verify 03/22/23 2359)  dextrose 50 % solution 0-50 mL (has no administration in time range)  divalproex (DEPAKOTE ER) 24 hr tablet 250 mg (has no administration in time range)  FLUoxetine (PROZAC) capsule 10 mg (has no administration in time range)  hydrocortisone (CORTEF) tablet 20 mg (has no administration in time range)    And  hydrocortisone (CORTEF) tablet 10 mg (has no administration in time range)  zinc sulfate capsule 220 mg (has no administration in time range)  sodium chloride flush (NS) 0.9 % injection 3 mL (3 mLs Intravenous Given 03/23/23 0000)  acetaminophen (TYLENOL) tablet 650 mg (has no administration in time  range)    Or  acetaminophen (TYLENOL) suppository 650 mg (has no administration in time range)  HYDROcodone-acetaminophen (NORCO/VICODIN) 5-325 MG per tablet 1 tablet (has no administration in time range)  morphine (PF) 2 MG/ML injection 2 mg (has no administration in time range)  sodium chloride flush (NS) 0.9 % injection 10 mL (10 mLs Intravenous Given 03/23/23 0000)  ondansetron (ZOFRAN) tablet 4 mg (has no administration  in time range)    Or  ondansetron (ZOFRAN) injection 4 mg (has no administration in time range)  0.9 %  sodium chloride infusion (has no administration in time range)  sodium chloride 0.9 % bolus 250 mL (has no administration in time range)  pantoprazole (PROTONIX) injection 40 mg (has no administration in time range)  levothyroxine (SYNTHROID) tablet 75 mcg (has no administration in time range)  sodium chloride 0.9 % bolus 1,000 mL (0 mLs Intravenous Stopped 03/22/23 2230)  ondansetron (ZOFRAN) injection 4 mg (4 mg Intravenous Given 03/22/23 2107)    Radiological Exams on Admission: DG Chest 2 View  Result Date: 03/22/2023 CLINICAL DATA:  Generalized weakness, nausea, vomiting, cough, dry heaving EXAM: CHEST - 2 VIEW COMPARISON:  07/27/2022 FINDINGS: The heart size and mediastinal contours are within normal limits. Both lungs are clear. The visualized skeletal structures are unremarkable. IMPRESSION: No active cardiopulmonary disease. Electronically Signed   By: Minerva Fester M.D.   On: 03/22/2023 23:30   CT HEAD WO CONTRAST ( )  Result Date: 03/22/2023 CLINICAL DATA:  Headache, generalized weakness, neuro deficit, vomiting EXAM: CT HEAD WITHOUT CONTRAST TECHNIQUE: Contiguous axial images were obtained from the base of the skull through the vertex without intravenous contrast. RADIATION DOSE REDUCTION: This exam was performed according to the departmental dose-optimization program which includes automated exposure control, adjustment of the mA and/or kV according  to patient size and/or use of iterative reconstruction technique. COMPARISON:  CT 07/27/2022 FINDINGS: Brain: No intracranial hemorrhage, mass effect, or evidence of acute infarct. No hydrocephalus. No extra-axial fluid collection. Vascular: No hyperdense vessel or unexpected calcification. Skull: No fracture or focal lesion. Sinuses/Orbits: No mucosal thickening in the ethmoid air cells. Other: None. IMPRESSION: No acute intracranial abnormality. Electronically Signed   By: Minerva Fester M.D.   On: 03/22/2023 23:13     Data Reviewed: Relevant notes from primary care and specialist visits, past discharge summaries as available in EHR, including Care Everywhere. Prior diagnostic testing as pertinent to current admission diagnoses Updated medications and problem lists for reconciliation ED course, including vitals, labs, imaging, treatment and response to treatment Triage notes, nursing and pharmacy notes and ED provider's notes Notable results as noted in HPI Assessment & Plan Type 1 diabetes mellitus with hyperosmolar hyperglycemic state (HHS) (HCC) Start pt on HHS protocol with IVF bolus and insulin gtt.  BMP q4 and PRN accucheck.  Monitor in [place] / telemetry.  Car consistent diet.  No nausea vomiting or GI symptoms.  Intake/Output Summary (Last 24 hours) at 03/23/2023 0043 Last data filed at 03/22/2023 2230 Gross per 24 hour  Intake 1000 ml  Output --  Net 1000 ml    sodium chloride     insulin 3.8 Units/hr (03/22/23 2359)   sodium chloride     Acute kidney injury superimposed on chronic kidney disease (HCC) Lab Results  Component Value Date   CREATININE 2.00 (H) 03/22/2023   CREATININE 2.19 (H) 03/22/2023   CREATININE 1.30 (H) 02/18/2023  Avoid contrast studies renally dose medications continue with IV fluid hydration expect return to baseline as above.  Hypothyroidism Elevated TSH and low free T4 have increased levothyroxine dose to 75 mcg from her existing 50  mcg. Chronic combined systolic and diastolic CHF (congestive heart failure) (HCC) Patient's last 2D echocardiogram was in October 2022 exactly 2 years from this year, showing ejection fraction of 50 to 55% left ventricular showing global hypokinesis, right ventricular systolic function is normal normal pulmonary artery systolic pressure small pericardial effusion  posterior to the left ventricle pulmonic valve regurg is moderate. Will monitor patient's I's and O's and daily weights. Currently patient is dehydrated with an elevated serum osmolality and volume contracted will start patient on gentle IV fluid hydration overnight with normal saline patient's received 1 L in the emergency room. Adrenal insufficiency (HCC) Patient is continued on her hydrocortisone 20 mg daily at breakfast and 10 mg daily at supper. Essential hypertension Vitals:   03/22/23 1934 03/22/23 1950 03/22/23 2000 03/22/23 2030  BP: (!) 88/66 91/67 110/75 113/75   03/22/23 2130 03/22/23 2200 03/22/23 2230  BP: 124/73 135/76 137/77  Blood pressure has improved with IVF.   DVT (deep venous thrombosis) (HCC) Heparin held due to pancytopenia, which is most likely from meds , pt is on depakote  and will need outpatient Hem Onc consult.  SCD's. Anemia    Latest Ref Rng & Units 03/22/2023   11:01 PM 03/22/2023    8:29 PM 02/18/2023    6:04 AM  CBC  WBC 4.0 - 10.5 K/uL 3.3  3.6  3.3   Hemoglobin 12.0 - 15.0 g/dL 7.5  7.8  7.2   Hematocrit 36.0 - 46.0 % 23.0  23.1  21.1   Platelets 150 - 400 K/uL 120  132  92   Type/ screen. IV PPI.    DVT prophylaxis:  SCD's Consults:  None  Advance Care Planning:    Code Status: Full Code   Family Communication:  None Disposition Plan:  Home . Severity of Illness: The appropriate patient status for this patient is INPATIENT. Inpatient status is judged to be reasonable and necessary in order to provide the required intensity of service to ensure the patient's safety. The  patient's presenting symptoms, physical exam findings, and initial radiographic and laboratory data in the context of their chronic comorbidities is felt to place them at high risk for further clinical deterioration. Furthermore, it is not anticipated that the patient will be medically stable for discharge from the hospital within 2 midnights of admission.   * I certify that at the point of admission it is my clinical judgment that the patient will require inpatient hospital care spanning beyond 2 midnights from the point of admission due to high intensity of service, high risk for further deterioration and high frequency of surveillance required.*  Author: Gertha Calkin, MD 03/23/2023 12:43 AM  For on call review [email or web address].  Critical care time: 40 minutes.

## 2023-03-22 NOTE — H&P (Incomplete)
History and Physical    Patient: Ashley Camacho ZOX:[id number] DOB: November 05, 1990 DOA: 03/22/2023 DOS: the patient was seen and examined on 03/22/2023 PCP: Pcp, No  Patient coming from: Home  Chief Complaint:  Chief Complaint  Patient presents with  . Weakness    HPI: Ashley Camacho is a 32 y.o. female with medical history significant for insulin-dependent diabetes mellitus type 1, severe gastroparesis, severe malnutrition, status post jejunostomy tube feed, adrenal insufficiency, CKD stage IIIb, hypertension, hypothyroid, coming for generalized weakness.  Patient is alert awake oriented states that she has been having upper respiratory symptoms of congestion chills runny nose, 3 days she has been taking NyQuil.  When she woke up had difficulty remembering. In ed pt was found to be hyperglycemic. She did say that her two year old got sick first and then she got it. No fever but had chills.   In emergency room vitals trend shows: Vitals:   03/22/23 2030 03/22/23 2130 03/22/23 2200 03/22/23 2230  BP: 113/75 124/73 135/76 137/77  Pulse: 74 77 73 73  Temp:      Resp: 17 19 15 12   Height:      Weight:      SpO2: 100% 100% 99% 100%  TempSrc:      BMI (Calculated):      Labs are notable for ***  In the ED pt received: Medications  insulin regular, human (MYXREDLIN) 100 units/ 100 mL infusion (3.8 Units/hr Intravenous Rate/Dose Verify 03/22/23 2310)  dextrose 50 % solution 0-50 mL (has no administration in time range)  divalproex (DEPAKOTE ER) 24 hr tablet 250 mg (has no administration in time range)  FLUoxetine (PROZAC) capsule 10 mg (has no administration in time range)  hydrocortisone (CORTEF) tablet 20 mg (has no administration in time range)    And  hydrocortisone (CORTEF) tablet 10 mg (has no administration in time range)  levothyroxine (SYNTHROID) tablet 50 mcg (has no administration in time range)  zinc sulfate capsule 220 mg (has no administration in time range)  sodium  chloride 0.9 % bolus 1,000 mL (1,000 mLs Intravenous New Bag/Given 03/22/23 2107)  ondansetron (ZOFRAN) injection 4 mg (4 mg Intravenous Given 03/22/23 2107)     ROS Past Medical History:  Diagnosis Date  . Acute metabolic encephalopathy 06/10/2022  . Adrenal insufficiency (HCC)   . Anemia   . CHF (congestive heart failure) (HCC)   . Gastroparesis   . Hypertension   . Type 1 diabetes [facility])    Past Surgical History:  Procedure Laterality Date  . BIOPSY  01/14/2021   Procedure: BIOPSY;  Surgeon: Tressia Danas, MD;  Location: [facility] ENDOSCOPY;  Service: Gastroenterology;;  . CESAREAN SECTION    . CESAREAN SECTION WITH BILATERAL TUBAL LIGATION N/A 02/17/2021   Procedure: CESAREAN SECTION WITH BILATERAL TUBAL LIGATION;  Surgeon: [name], DO;  Location: [facility] LD ORS;  Service: Obstetrics;  Laterality: N/A;  . ESOPHAGOGASTRODUODENOSCOPY (EGD) WITH PROPOFOL N/A 01/14/2021   Procedure: ESOPHAGOGASTRODUODENOSCOPY (EGD) WITH PROPOFOL;  Surgeon: Tressia Danas, MD;  Location: [facility] ENDOSCOPY;  Service: Gastroenterology;  Laterality: N/A;  . IR BONE MARROW BIOPSY & ASPIRATION  12/16/2022  . IR REPLC DUODEN/JEJUNO TUBE PERCUT W/FLUORO  11/22/2022  . JEJUNOSTOMY N/A 06/13/2022   Procedure: JEJUNOSTOMY;  Surgeon: Leafy Ro, MD;  Location: [facility] ORS;  Service: General;  Laterality: N/A;  . MOUTH SURGERY      reports that she has never smoked. She has never been exposed to tobacco smoke. She has never used smokeless  tobacco. She reports that she does not drink alcohol and does not use drugs.  No Known Allergies  Family History  Problem Relation Age of Onset  . Breast cancer Mother   . Seizures Mother   . Diabetes type I Father   . CAD Father   . Lung cancer Maternal Grandfather   . CAD Paternal Grandmother   . CAD Paternal Grandfather   . Ovarian cancer Neg Hx     Prior to Admission medications   Medication Sig Start Date End Date Taking? Authorizing Provider  acetaminophen  (TYLENOL) 500 MG tablet Take 2 tablets (1,000 mg total) by mouth every 6 (six) hours as needed for headache, fever or moderate pain. 08/05/22   Loyce Dys, MD  divalproex (DEPAKOTE ER) 250 MG 24 hr tablet Take 1 tablet (250 mg total) by mouth daily. 08/05/22   Loyce Dys, MD  FLUoxetine (PROZAC) 10 MG capsule Take 1 capsule (10 mg total) by mouth daily. 08/05/22 12/30/22  Loyce Dys, MD  hydrocortisone (CORTEF) 10 MG tablet Take 2 tablets (20 mg total) by mouth daily with breakfast AND 1 tablet (10 mg total) daily with supper. 08/05/22   Loyce Dys, MD  insulin aspart (NOVOLOG) 100 UNIT/ML injection Inject 2 Units into the skin 3 (three) times daily with meals. 08/05/22 02/15/23  Loyce Dys, MD  insulin aspart (NOVOLOG) 100 UNIT/ML injection Inject 0-6 Units into the skin 4 (four) times daily - after meals and at bedtime. 11/15/22   Leeroy Bock, MD  Insulin Glargine [facility]) 100 UNIT/ML Inject 5 Units into the skin 2 (two) times daily. 11/15/22 05/22/23  Leeroy Bock, MD  levothyroxine (SYNTHROID) 50 MCG tablet Take 1 tablet (50 mcg total) by mouth daily at 6 (six) AM. 08/05/22 12/30/22  Loyce Dys, MD  loperamide (IMODIUM) 2 MG capsule Take 2 capsules (4 mg total) by mouth as needed for diarrhea or loose stools. 11/15/22   Leeroy Bock, MD  Multiple Vitamin (MULTIVITAMIN WITH MINERALS) TABS tablet Take 1 tablet by mouth daily. 11/16/22   Leeroy Bock, MD  Nutritional Supplements (FEEDING SUPPLEMENT, GLUCERNA 1.5 CAL,) LIQD Place 640 mLs into feeding tube at bedtime. 11/15/22   Leeroy Bock, MD  promethazine (PHENERGAN) 25 MG tablet Take 0.5 tablets (12.5 mg total) by mouth every 6 (six) hours as needed for up to 10 days for nausea or vomiting. 02/18/23 02/28/23  Loyce Dys, MD  zinc gluconate 50 MG tablet Take 1 tablet (50 mg total) by mouth daily. 11/20/22   Rickard Patience, MD     Vitals:   03/22/23 2030 03/22/23 2130 03/22/23 2200 03/22/23 2230   BP: 113/75 124/73 135/76 137/77  Pulse: 74 77 73 73  Resp: 17 19 15 12   Temp:      TempSrc:      SpO2: 100% 100% 99% 100%  Weight:      Height:       Physical Exam   Labs on Admission: I have personally reviewed following labs and imaging studies Results for orders placed or performed during the hospital encounter of 03/22/23 (from the past 24 hour(s))  CBC     Status: Abnormal   Collection Time: 03/22/23  8:29 PM  Result Value Ref Range   WBC 3.6 (L) 4.0 - 10.5 K/uL   RBC 2.63 (L) 3.87 - 5.11 MIL/uL   Hemoglobin 7.8 (L) 12.0 - 15.0 g/dL   HCT 16.1 (L) 09.6 - 04.5 %  MCV 87.8 80.0 - 100.0 fL   MCH 29.7 26.0 - 34.0 pg   MCHC 33.8 30.0 - 36.0 g/dL   RDW 16.1 09.6 - 04.5 %   Platelets 132 (L) 150 - 400 K/uL   nRBC 0.0 0.0 - 0.2 %  Comprehensive metabolic panel     Status: Abnormal   Collection Time: 03/22/23  8:29 PM  Result Value Ref Range   Sodium 130 (L) 135 - 145 mmol/L   Potassium 5.8 (H) 3.5 - 5.1 mmol/L   Chloride 97 (L) 98 - 111 mmol/L   CO2 20 (L) 22 - 32 mmol/L   Glucose, Bld 714 (HH) 70 - 99 mg/dL   BUN 38 (H) 6 - 20 mg/dL   Creatinine, Ser 4.09 (H) 0.44 - 1.00 mg/dL   Calcium 9.0 8.9 - 81.1 mg/dL   Total Protein 7.5 6.5 - 8.1 g/dL   Albumin 4.2 3.5 - 5.0 g/dL   AST 15 15 - 41 U/L   ALT 17 0 - 44 U/L   Alkaline Phosphatase 89 38 - 126 U/L   Total Bilirubin 1.2 0.3 - 1.2 mg/dL   GFR, Estimated 30 (L) >60 mL/min   Anion gap 13 5 - 15  Lipase, blood     Status: None   Collection Time: 03/22/23  8:29 PM  Result Value Ref Range   Lipase 21 11 - 51 U/L  Troponin I (High Sensitivity)     Status: None   Collection Time: 03/22/23  8:29 PM  Result Value Ref Range   Troponin I (High Sensitivity) 3 <18 ng/L  Acetaminophen level     Status: None   Collection Time: 03/22/23  8:29 PM  Result Value Ref Range   Acetaminophen (Tylenol), Serum 13 10 - 30 ug/mL  Magnesium     Status: None   Collection Time: 03/22/23  8:29 PM  Result Value Ref Range   Magnesium 2.4  1.7 - 2.4 mg/dL  TSH     Status: Abnormal   Collection Time: 03/22/23  8:29 PM  Result Value Ref Range   TSH 4.706 (H) 0.350 - 4.500 uIU/mL  T4, free     Status: None   Collection Time: 03/22/23  8:29 PM  Result Value Ref Range   Free T4 0.67 0.61 - 1.12 ng/dL  Resp panel by RT-PCR (RSV, Flu A&B, Covid) Anterior Nasal Swab     Status: None   Collection Time: 03/22/23  9:11 PM   Specimen: Anterior Nasal Swab  Result Value Ref Range   SARS Coronavirus 2 by RT PCR NEGATIVE NEGATIVE   Influenza A by PCR NEGATIVE NEGATIVE   Influenza B by PCR NEGATIVE NEGATIVE   Resp Syncytial Virus by PCR NEGATIVE NEGATIVE  CBG monitoring, ED     Status: Abnormal   Collection Time: 03/22/23 10:29 PM  Result Value Ref Range   Glucose-Capillary 538 (HH) 70 - 99 mg/dL  Basic metabolic panel     Status: Abnormal   Collection Time: 03/22/23 11:01 PM  Result Value Ref Range   Sodium 132 (L) 135 - 145 mmol/L   Potassium 5.3 (H) 3.5 - 5.1 mmol/L   Chloride 101 98 - 111 mmol/L   CO2 21 (L) 22 - 32 mmol/L   Glucose, Bld 595 (HH) 70 - 99 mg/dL   BUN 36 (H) 6 - 20 mg/dL   Creatinine, Ser 9.14 (H) 0.44 - 1.00 mg/dL   Calcium 8.5 (L) 8.9 - 10.3 mg/dL   GFR, Estimated 34 (L) >  60 mL/min   Anion gap 10 5 - 15  CBC with Differential (PNL)     Status: Abnormal   Collection Time: 03/22/23 11:01 PM  Result Value Ref Range   WBC 3.3 (L) 4.0 - 10.5 K/uL   RBC 2.57 (L) 3.87 - 5.11 MIL/uL   Hemoglobin 7.5 (L) 12.0 - 15.0 g/dL   HCT 33.2 (L) 95.1 - 88.4 %   MCV 89.5 80.0 - 100.0 fL   MCH 29.2 26.0 - 34.0 pg   MCHC 32.6 30.0 - 36.0 g/dL   RDW 16.6 06.3 - 01.6 %   Platelets 120 (L) 150 - 400 K/uL   nRBC 0.0 0.0 - 0.2 %   Neutrophils Relative % 57 %   Neutro Abs 1.9 1.7 - 7.7 K/uL   Lymphocytes Relative 31 %   Lymphs Abs 1.0 0.7 - 4.0 K/uL   Monocytes Relative 6 %   Monocytes Absolute 0.2 0.1 - 1.0 K/uL   Eosinophils Relative 5 %   Eosinophils Absolute 0.2 0.0 - 0.5 K/uL   Basophils Relative 1 %   Basophils  Absolute 0.0 0.0 - 0.1 K/uL   Immature Granulocytes 0 %   Abs Immature Granulocytes 0.01 0.00 - 0.07 K/uL  CBG monitoring, ED     Status: Abnormal   Collection Time: 03/22/23 11:09 PM  Result Value Ref Range   Glucose-Capillary 485 (H) 70 - 99 mg/dL    CBC: Recent Labs  Lab 03/22/23 2029 03/22/23 2301  WBC 3.6* 3.3*  NEUTROABS  --  1.9  HGB 7.8* 7.5*  HCT 23.1* 23.0*  MCV 87.8 89.5  PLT 132* 120*   Basic Metabolic Panel: Recent Labs  Lab 03/22/23 2029 03/22/23 2301  NA 130* 132*  K 5.8* 5.3*  CL 97* 101  CO2 20* 21*  GLUCOSE 714* 595*  BUN 38* 36*  CREATININE 2.19* 2.00*  CALCIUM 9.0 8.5*  MG 2.4  --    GFR: Estimated Creatinine Clearance: 27.7 mL/min (A) (by C-G formula based on SCr of 2 mg/dL (H)). Liver Function Tests: Recent Labs  Lab 03/22/23 2029  AST 15  ALT 17  ALKPHOS 89  BILITOT 1.2  PROT 7.5  ALBUMIN 4.2   Recent Labs  Lab 03/22/23 2029  LIPASE 21   No results for input(s): "AMMONIA" in the last 168 hours. Coagulation Profile: No results for input(s): "INR", "PROTIME" in the last 168 hours. Cardiac Enzymes: No results for input(s): "CKTOTAL", "CKMB", "CKMBINDEX", "TROPONINI" in the last 168 hours. BNP (last 3 results) No results for input(s): "PROBNP" in the last 8760 hours. HbA1C: No results for input(s): "HGBA1C" in the last 72 hours. CBG: Recent Labs  Lab 03/22/23 2229 03/22/23 2309  GLUCAP 538* 485*   Lipid Profile: No results for input(s): "CHOL", "HDL", "LDLCALC", "TRIG", "CHOLHDL", "LDLDIRECT" in the last 72 hours. Thyroid Function Tests: Recent Labs    03/22/23 2029  TSH 4.706*  FREET4 0.67   Anemia Panel: No results for input(s): "VITAMINB12", "FOLATE", "FERRITIN", "TIBC", "IRON", "RETICCTPCT" in the last 72 hours. Urinalysis    Component Value Date/Time   COLORURINE YELLOW (A) 11/23/2022 1600   APPEARANCEUR CLEAR (A) 11/23/2022 1600   APPEARANCEUR Clear 09/18/2020 1153   LABSPEC 1.009 11/23/2022 1600    LABSPEC 1.026 03/01/2013 1844   PHURINE 8.0 11/23/2022 1600   GLUCOSEU >=500 (A) 11/23/2022 1600   GLUCOSEU >=500 03/01/2013 1844   HGBUR NEGATIVE 11/23/2022 1600   BILIRUBINUR NEGATIVE 11/23/2022 1600   BILIRUBINUR neg 10/23/2020 0953   BILIRUBINUR  Negative 09/18/2020 1153   BILIRUBINUR Negative 03/01/2013 1844   KETONESUR NEGATIVE 11/23/2022 1600   PROTEINUR 30 (A) 11/23/2022 1600   UROBILINOGEN 0.2 10/23/2020 0953   NITRITE NEGATIVE 11/23/2022 1600   LEUKOCYTESUR NEGATIVE 11/23/2022 1600   LEUKOCYTESUR Negative 03/01/2013 1844      Unresulted Labs (From admission, onward)     Start     Ordered   03/23/23 0500  CBC with Differential/Platelet  Tomorrow morning,   R        03/22/23 2224   03/23/23 0500  Basic metabolic panel  Tomorrow morning,   R        03/22/23 2224   03/23/23 0500  Magnesium  Tomorrow morning,   R        03/22/23 2234   03/23/23 0500  Phosphorus  Tomorrow morning,   R        03/22/23 2234   03/22/23 2148  Osmolality  (Hyperglycemic Hyperosmolar State (HHS))  Once,   URGENT        03/22/23 2148   03/22/23 2145  Magnesium  Add-on,   AD        03/22/23 2224   03/22/23 1936  Urinalysis, Routine w reflex microscopic -Urine, Clean Catch  Once,   URGENT       Question:  Specimen Source  Answer:  Urine, Clean Catch   03/22/23 1938            Medications  insulin regular, human (MYXREDLIN) 100 units/ 100 mL infusion (3.8 Units/hr Intravenous Rate/Dose Verify 03/22/23 2310)  dextrose 50 % solution 0-50 mL (has no administration in time range)  divalproex (DEPAKOTE ER) 24 hr tablet 250 mg (has no administration in time range)  FLUoxetine (PROZAC) capsule 10 mg (has no administration in time range)  hydrocortisone (CORTEF) tablet 20 mg (has no administration in time range)    And  hydrocortisone (CORTEF) tablet 10 mg (has no administration in time range)  levothyroxine (SYNTHROID) tablet 50 mcg (has no administration in time range)  zinc sulfate capsule  220 mg (has no administration in time range)  sodium chloride 0.9 % bolus 1,000 mL (1,000 mLs Intravenous New Bag/Given 03/22/23 2107)  ondansetron (ZOFRAN) injection 4 mg (4 mg Intravenous Given 03/22/23 2107)    Radiological Exams on Admission: CT HEAD WO CONTRAST ( )  Result Date: 03/22/2023 CLINICAL DATA:  Headache, generalized weakness, neuro deficit, vomiting EXAM: CT HEAD WITHOUT CONTRAST TECHNIQUE: Contiguous axial images were obtained from the base of the skull through the vertex without intravenous contrast. RADIATION DOSE REDUCTION: This exam was performed according to the departmental dose-optimization program which includes automated exposure control, adjustment of the mA and/or kV according to patient size and/or use of iterative reconstruction technique. COMPARISON:  CT 07/27/2022 FINDINGS: Brain: No intracranial hemorrhage, mass effect, or evidence of acute infarct. No hydrocephalus. No extra-axial fluid collection. Vascular: No hyperdense vessel or unexpected calcification. Skull: No fracture or focal lesion. Sinuses/Orbits: No mucosal thickening in the ethmoid air cells. Other: None. IMPRESSION: No acute intracranial abnormality. Electronically Signed   By: Minerva Fester M.D.   On: 03/22/2023 23:13     Data Reviewed: Relevant notes from primary care and specialist visits, past discharge summaries as available in EHR, including Care Everywhere. Prior diagnostic testing as pertinent to current admission diagnoses Updated medications and problem lists for reconciliation ED course, including vitals, labs, imaging, treatment and response to treatment Triage notes, nursing and pharmacy notes and ED provider's notes Notable results as noted in HPI  Assessment and Plan: No notes have been filed under this hospital service. Service: Hospitalist       Prognosis: ***  DVT prophylaxis:  ***  Consults:  ***  Advance Care Planning:    Code Status: Prior   Family  Communication:  ***  Disposition Plan:  ***  Severity of Illness: {Observation/Inpatient:21159}  Author: Gertha Calkin, MD 03/22/2023 11:30 PM  For on call review [email or web address].

## 2023-03-22 NOTE — Hospital Course (Signed)
insulin-dependent diabetes mellitus type 1, severe gastroparesis, severe malnutrition, status post jejunostomy tube feed, adrenal insufficiency, CKD stage IIIb, hypertension, hypothyroid,

## 2023-03-22 NOTE — ED Triage Notes (Signed)
Pt presents to ER with c/o generalized weakness.  Pt states over the last few days, she has been nauseous, with some vomiting and dry heaving.  Pt states she took some Nyquil last night and again this morning around 0800.  Pt states she did not measure out how much Nyquil she took, but just took it and went to sleep.  States her weakness has become worse since taking Nyquil.  Pt appears pale in triage, but is otherwise A&O x4 and in NAD at this time.

## 2023-03-22 NOTE — Consult Note (Signed)
PHARMACY CONSULT NOTE - ELECTROLYTES  Pharmacy Consult for Electrolyte Monitoring and Replacement   Recent Labs: Height: 5\' 1"  (154.9 cm) Weight: 43.1 kg (95 lb) IBW/kg (Calculated) : 47.8 Estimated Creatinine Clearance: 25.3 mL/min (A) (by C-G formula based on SCr of 2.19 mg/dL (H)). Potassium (mmol/L)  Date Value  03/22/2023 5.8 (H)  03/01/2013 4.1   Magnesium (mg/dL)  Date Value  66/44/0347 2.4   Calcium (mg/dL)  Date Value  42/59/5638 9.0   Calcium, Total (mg/dL)  Date Value  75/64/3329 9.1   Albumin (g/dL)  Date Value  51/88/4166 4.2  03/01/2013 3.9   Phosphorus (mg/dL)  Date Value  11/21/1599 3.0   Sodium (mmol/L)  Date Value  03/22/2023 130 (L)  03/01/2013 133 (L)    Assessment  Ashley Camacho is a 32 y.o. female presenting with DKA and N/V. PMH significant for T1DM, gastroparesis, and adrenal insufficiency. Pharmacy has been consulted to monitor and replace electrolytes.  Diet: NPO MIVF: N/A Pertinent medications: Insulin gtt  Goal of Therapy: Electrolytes WNL  Plan:  No replacement indicated at this time. Check BMP, Mg, Phos with AM labs  Thank you for allowing pharmacy to be a part of this patient's care.  Barrie Folk, PharmD Clinical Pharmacist 03/22/2023 10:30 PM

## 2023-03-22 NOTE — ED Provider Notes (Signed)
[facility] Provider Note    Event Date/Time   First MD Initiated Contact with Patient 03/22/23 1946     (approximate)   History   Weakness   HPI  Ashley Camacho is a 32 y.o. female with a history of type 1 diabetes adrenal insufficiency, gastroparesis, status post J-tube placement for night feeds, CKD who comes in with weakness.  Patient has been having increasing vomiting and dry heaving patient reports taking some NyQuil to try to help her sleep.  Patient reports taking 3 dose of NyQuil overnight both about a small spoons worth.  She reports last Tylenol use was at 8 AM.  She states that she was using it just to try to help her sleep given she was not feeling well.  However she noticed that between 11 and 3 she was having some increasing difficulties remembering what had happened and initially thought it could be related to the NyQuil but still just remained not feeling great.  She also reports no known falls no chest pain no abdominal pain but does have some nausea, cough.   I reviewed the discharge summary from 02/18/2023   Physical Exam   Triage Vital Signs: ED Triage Vitals [03/22/23 1934]  Encounter Vitals Group     BP (!) 88/66     Systolic BP Percentile      Diastolic BP Percentile      Pulse Rate 85     Resp 20     Temp 98.4 F (36.9 C)     Temp Source Oral     SpO2 100 %     Weight 95 lb (43.1 kg)     Height 5\' 1"  (1.549 m)     Head Circumference      Peak Flow      Pain Score      Pain Loc      Pain Education      Exclude from Growth Chart     Most recent vital signs: Vitals:   03/22/23 1934  BP: (!) 88/66  Pulse: 85  Resp: 20  Temp: 98.4 F (36.9 C)  SpO2: 100%     General: Awake, no distress.  CV:  Good peripheral perfusion.  Resp:  Normal effort.  Abd:  No distention.  Other:  Patient is pale, thin cachectic with tube noted in her abdomen without any tenderness noted   ED Results / Procedures / Treatments    Labs (all labs ordered are listed, but only abnormal results are displayed) Labs Reviewed  CBC  COMPREHENSIVE METABOLIC PANEL  LIPASE, BLOOD  URINALYSIS, ROUTINE W REFLEX MICROSCOPIC  ACETAMINOPHEN LEVEL  POC URINE PREG, ED  TROPONIN I (HIGH SENSITIVITY)     EKG  My interpretation of EKG:  Normal sinus rate of 85 without any ST elevations, t inversions in the inferior lateral leads, normal intervals.  Reviewed prior EKG has had similar T wave inversions.  RADIOLOGY I have reviewed the CT personally interpreted no evidence of intracranial hemorrhage   PROCEDURES:  Critical Care performed: Yes, see critical care procedure note(s)  .1-3 Lead EKG Interpretation  Performed by: Concha Se, MD Authorized by: Concha Se, MD     Interpretation: normal     ECG rate:  70   ECG rate assessment: normal     Rhythm: sinus rhythm     Ectopy: none     Conduction: normal   .Critical Care  Performed by: Concha Se, MD Authorized by:  Concha Se, MD   Critical care provider statement:    Critical care time (minutes):  30   Critical care was necessary to treat or prevent imminent or life-threatening deterioration of the following conditions:  Endocrine crisis   Critical care was time spent personally by me on the following activities:  Development of treatment plan with patient or surrogate, discussions with consultants, evaluation of patient's response to treatment, examination of patient, ordering and review of laboratory studies, ordering and review of radiographic studies, ordering and performing treatments and interventions, pulse oximetry, re-evaluation of patient's condition and review of old charts    MEDICATIONS ORDERED IN ED: Medications  insulin regular, human (MYXREDLIN) 100 units/ 100 mL infusion (3.8 Units/hr Intravenous Rate/Dose Verify 03/22/23 2310)  dextrose 50 % solution 0-50 mL (has no administration in time range)  divalproex (DEPAKOTE ER) 24 hr  tablet 250 mg (has no administration in time range)  FLUoxetine (PROZAC) capsule 10 mg (has no administration in time range)  hydrocortisone (CORTEF) tablet 20 mg (has no administration in time range)    And  hydrocortisone (CORTEF) tablet 10 mg (has no administration in time range)  levothyroxine (SYNTHROID) tablet 50 mcg (has no administration in time range)  zinc sulfate capsule 220 mg (has no administration in time range)  sodium chloride 0.9 % bolus 1,000 mL (1,000 mLs Intravenous New Bag/Given 03/22/23 2107)  ondansetron (ZOFRAN) injection 4 mg (4 mg Intravenous Given 03/22/23 2107)     IMPRESSION / MDM / ASSESSMENT AND PLAN / ED COURSE  I reviewed the triage vital signs and the nursing notes.   Patient's presentation is most consistent with acute presentation with potential threat to life or bodily function.   Patient comes in with concerns for some memory issues, taking excessive NyQuil.  Will get CT imaging evaluate for any Intracranial hemorrhage, intercranial mass to get labs to evaluate for Electra abnormalities, AKI.  Does not sound like she had a known seizure but can monitor for that as well.  CBC shows stable hemoglobin and her glucose was significantly elevated with potassium of 5.8 without anion gap not DKA but will start on insulin.  She is got new AKI.  Her Tylenol level was only slightly elevated we will get a repeat 4-hour level from being here but this does not be criteria for NAC rx   Will admit to hospital.    The patient is on the cardiac monitor to evaluate for evidence of arrhythmia and/or significant heart rate changes.      FINAL CLINICAL IMPRESSION(S) / ED DIAGNOSES   Final diagnoses:  Weakness  AKI (acute kidney injury) (HCC)  Hyperglycemia     Rx / DC Orders   ED Discharge Orders     None        Note:  This document was prepared using Dragon voice recognition software and may include unintentional dictation errors.   Concha Se,  MD 03/22/23 [id number]

## 2023-03-23 ENCOUNTER — Encounter: Payer: Self-pay | Admitting: Internal Medicine

## 2023-03-23 DIAGNOSIS — E870 Hyperosmolality and hypernatremia: Secondary | ICD-10-CM | POA: Diagnosis present

## 2023-03-23 DIAGNOSIS — N1832 Chronic kidney disease, stage 3b: Secondary | ICD-10-CM | POA: Diagnosis not present

## 2023-03-23 DIAGNOSIS — N189 Chronic kidney disease, unspecified: Secondary | ICD-10-CM | POA: Diagnosis not present

## 2023-03-23 DIAGNOSIS — E2740 Unspecified adrenocortical insufficiency: Secondary | ICD-10-CM | POA: Diagnosis present

## 2023-03-23 DIAGNOSIS — Z419 Encounter for procedure for purposes other than remedying health state, unspecified: Secondary | ICD-10-CM | POA: Diagnosis not present

## 2023-03-23 DIAGNOSIS — K8689 Other specified diseases of pancreas: Secondary | ICD-10-CM | POA: Diagnosis present

## 2023-03-23 DIAGNOSIS — E440 Moderate protein-calorie malnutrition: Secondary | ICD-10-CM | POA: Diagnosis not present

## 2023-03-23 DIAGNOSIS — R531 Weakness: Secondary | ICD-10-CM | POA: Diagnosis not present

## 2023-03-23 DIAGNOSIS — N179 Acute kidney failure, unspecified: Secondary | ICD-10-CM

## 2023-03-23 DIAGNOSIS — I5042 Chronic combined systolic (congestive) and diastolic (congestive) heart failure: Secondary | ICD-10-CM | POA: Diagnosis not present

## 2023-03-23 DIAGNOSIS — R519 Headache, unspecified: Secondary | ICD-10-CM | POA: Diagnosis not present

## 2023-03-23 DIAGNOSIS — E1065 Type 1 diabetes mellitus with hyperglycemia: Secondary | ICD-10-CM | POA: Diagnosis not present

## 2023-03-23 DIAGNOSIS — Z1152 Encounter for screening for COVID-19: Secondary | ICD-10-CM | POA: Diagnosis not present

## 2023-03-23 DIAGNOSIS — D649 Anemia, unspecified: Secondary | ICD-10-CM | POA: Diagnosis present

## 2023-03-23 DIAGNOSIS — R29818 Other symptoms and signs involving the nervous system: Secondary | ICD-10-CM | POA: Diagnosis not present

## 2023-03-23 DIAGNOSIS — I130 Hypertensive heart and chronic kidney disease with heart failure and stage 1 through stage 4 chronic kidney disease, or unspecified chronic kidney disease: Secondary | ICD-10-CM | POA: Diagnosis not present

## 2023-03-23 DIAGNOSIS — E871 Hypo-osmolality and hyponatremia: Secondary | ICD-10-CM | POA: Diagnosis present

## 2023-03-23 DIAGNOSIS — R059 Cough, unspecified: Secondary | ICD-10-CM | POA: Diagnosis not present

## 2023-03-23 DIAGNOSIS — Z794 Long term (current) use of insulin: Secondary | ICD-10-CM | POA: Diagnosis not present

## 2023-03-23 DIAGNOSIS — E875 Hyperkalemia: Secondary | ICD-10-CM | POA: Diagnosis present

## 2023-03-23 DIAGNOSIS — E1069 Type 1 diabetes mellitus with other specified complication: Secondary | ICD-10-CM | POA: Diagnosis not present

## 2023-03-23 DIAGNOSIS — E1022 Type 1 diabetes mellitus with diabetic chronic kidney disease: Secondary | ICD-10-CM | POA: Diagnosis present

## 2023-03-23 DIAGNOSIS — E1043 Type 1 diabetes mellitus with diabetic autonomic (poly)neuropathy: Secondary | ICD-10-CM | POA: Diagnosis not present

## 2023-03-23 DIAGNOSIS — R9431 Abnormal electrocardiogram [ECG] [EKG]: Secondary | ICD-10-CM | POA: Diagnosis not present

## 2023-03-23 DIAGNOSIS — E039 Hypothyroidism, unspecified: Secondary | ICD-10-CM | POA: Diagnosis not present

## 2023-03-23 DIAGNOSIS — D631 Anemia in chronic kidney disease: Secondary | ICD-10-CM | POA: Diagnosis not present

## 2023-03-23 DIAGNOSIS — D61818 Other pancytopenia: Secondary | ICD-10-CM | POA: Diagnosis not present

## 2023-03-23 DIAGNOSIS — Z934 Other artificial openings of gastrointestinal tract status: Secondary | ICD-10-CM | POA: Diagnosis not present

## 2023-03-23 DIAGNOSIS — E860 Dehydration: Secondary | ICD-10-CM | POA: Diagnosis present

## 2023-03-23 DIAGNOSIS — Z681 Body mass index (BMI) 19 or less, adult: Secondary | ICD-10-CM | POA: Diagnosis not present

## 2023-03-23 DIAGNOSIS — R112 Nausea with vomiting, unspecified: Secondary | ICD-10-CM | POA: Diagnosis not present

## 2023-03-23 DIAGNOSIS — E10649 Type 1 diabetes mellitus with hypoglycemia without coma: Secondary | ICD-10-CM | POA: Diagnosis not present

## 2023-03-23 DIAGNOSIS — E872 Acidosis, unspecified: Secondary | ICD-10-CM | POA: Diagnosis not present

## 2023-03-23 LAB — CBC WITH DIFFERENTIAL/PLATELET
Abs Immature Granulocytes: 0 10*3/uL (ref 0.00–0.07)
Basophils Absolute: 0 10*3/uL (ref 0.0–0.1)
Basophils Relative: 1 %
Eosinophils Absolute: 0.2 10*3/uL (ref 0.0–0.5)
Eosinophils Relative: 6 %
HCT: 21.3 % — ABNORMAL LOW (ref 36.0–46.0)
Hemoglobin: 7.2 g/dL — ABNORMAL LOW (ref 12.0–15.0)
Immature Granulocytes: 0 %
Lymphocytes Relative: 35 %
Lymphs Abs: 1.2 10*3/uL (ref 0.7–4.0)
MCH: 29.8 pg (ref 26.0–34.0)
MCHC: 33.8 g/dL (ref 30.0–36.0)
MCV: 88 fL (ref 80.0–100.0)
Monocytes Absolute: 0.3 10*3/uL (ref 0.1–1.0)
Monocytes Relative: 8 %
Neutro Abs: 1.6 10*3/uL — ABNORMAL LOW (ref 1.7–7.7)
Neutrophils Relative %: 50 %
Platelets: 137 10*3/uL — ABNORMAL LOW (ref 150–400)
RBC: 2.42 MIL/uL — ABNORMAL LOW (ref 3.87–5.11)
RDW: 12.2 % (ref 11.5–15.5)
WBC: 3.3 10*3/uL — ABNORMAL LOW (ref 4.0–10.5)
nRBC: 0 % (ref 0.0–0.2)

## 2023-03-23 LAB — BASIC METABOLIC PANEL
Anion gap: 7 (ref 5–15)
Anion gap: 7 (ref 5–15)
BUN: 34 mg/dL — ABNORMAL HIGH (ref 6–20)
BUN: 32 mg/dL — ABNORMAL HIGH (ref 6–20)
CO2: 23 mmol/L (ref 22–32)
CO2: 21 mmol/L — ABNORMAL LOW (ref 22–32)
Calcium: 8.5 mg/dL — ABNORMAL LOW (ref 8.9–10.3)
Calcium: 8.4 mg/dL — ABNORMAL LOW (ref 8.9–10.3)
Chloride: 109 mmol/L (ref 98–111)
Chloride: 112 mmol/L — ABNORMAL HIGH (ref 98–111)
Creatinine, Ser: 1.91 mg/dL — ABNORMAL HIGH (ref 0.44–1.00)
Creatinine, Ser: 1.76 mg/dL — ABNORMAL HIGH (ref 0.44–1.00)
GFR, Estimated: 36 mL/min — ABNORMAL LOW (ref >60)
GFR, Estimated: 39 mL/min — ABNORMAL LOW (ref >60)
Glucose, Bld: 157 mg/dL — ABNORMAL HIGH (ref 70–99)
Glucose, Bld: 161 mg/dL — ABNORMAL HIGH (ref 70–99)
Potassium: 3.7 mmol/L (ref 3.5–5.1)
Potassium: 4.6 mmol/L (ref 3.5–5.1)
Sodium: 139 mmol/L (ref 135–145)
Sodium: 140 mmol/L (ref 135–145)

## 2023-03-23 LAB — URINALYSIS, ROUTINE W REFLEX MICROSCOPIC
Bilirubin Urine: NEGATIVE
Glucose, UA: >=500 mg/dL — AB
Hgb urine dipstick: NEGATIVE
Ketones, ur: 5 mg/dL — AB
Leukocytes,Ua: NEGATIVE
Nitrite: NEGATIVE
Protein, ur: NEGATIVE mg/dL
Specific Gravity, Urine: 1.017 (ref 1.005–1.030)
pH: 5 (ref 5.0–8.0)

## 2023-03-23 LAB — ACETAMINOPHEN LEVEL: Acetaminophen (Tylenol), Serum: <10 ug/mL — ABNORMAL LOW (ref 10–30)

## 2023-03-23 LAB — VITAMIN B12: Vitamin B-12: 583 pg/mL (ref 180–914)

## 2023-03-23 LAB — CBG MONITORING, ED
Glucose-Capillary: 178 mg/dL — ABNORMAL HIGH (ref 70–99)
Glucose-Capillary: 182 mg/dL — ABNORMAL HIGH (ref 70–99)
Glucose-Capillary: 302 mg/dL — ABNORMAL HIGH (ref 70–99)
Glucose-Capillary: 434 mg/dL — ABNORMAL HIGH (ref 70–99)

## 2023-03-23 LAB — MAGNESIUM: Magnesium: 2.4 mg/dL (ref 1.7–2.4)

## 2023-03-23 LAB — GLUCOSE, CAPILLARY
Glucose-Capillary: 401 mg/dL — ABNORMAL HIGH (ref 70–99)
Glucose-Capillary: 277 mg/dL — ABNORMAL HIGH (ref 70–99)
Glucose-Capillary: 176 mg/dL — ABNORMAL HIGH (ref 70–99)

## 2023-03-23 LAB — IRON AND TIBC
Iron: 54 ug/dL (ref 28–170)
Saturation Ratios: 53 % — ABNORMAL HIGH (ref 10.4–31.8)
TIBC: 102 ug/dL — ABNORMAL LOW (ref 250–450)
UIBC: 48 ug/dL

## 2023-03-23 LAB — HCG, QUANTITATIVE, PREGNANCY: hCG, Beta Chain, Quant, S: 2 m[IU]/mL (ref <5)

## 2023-03-23 LAB — PREPARE RBC (CROSSMATCH)

## 2023-03-23 LAB — PHOSPHORUS: Phosphorus: 3.2 mg/dL (ref 2.5–4.6)

## 2023-03-23 MED ORDER — INSULIN GLARGINE-YFGN 100 UNIT/ML ~~LOC~~ SOLN
5.0000 [IU] | Freq: Every day | SUBCUTANEOUS | Status: DC
  Administered 2023-03-23: 5 [IU] via SUBCUTANEOUS
  Filled 2023-03-23 (×2): qty 0.05

## 2023-03-23 MED ORDER — GLUCERNA 1.5 CAL PO LIQD
1000.0000 mL | ORAL | Status: DC
  Administered 2023-03-23: 1000 mL

## 2023-03-23 MED ORDER — OSMOLITE 1.2 CAL PO LIQD: 1000.0000 mL | ORAL | Status: DC

## 2023-03-23 MED ORDER — SODIUM CHLORIDE 0.9 % IV SOLN: INTRAVENOUS | Status: AC

## 2023-03-23 MED ORDER — INSULIN ASPART 100 UNIT/ML IJ SOLN: 0.0000 [IU] | Freq: Three times a day (TID) | INTRAMUSCULAR | Status: DC

## 2023-03-23 MED ORDER — SODIUM CHLORIDE 0.9 % IV BOLUS
250.0000 mL | Freq: Once | INTRAVENOUS | Status: AC
  Administered 2023-03-23: 250 mL via INTRAVENOUS

## 2023-03-23 MED ORDER — GUAIFENESIN 100 MG/5ML PO LIQD
5.0000 mL | ORAL | Status: DC | PRN
  Administered 2023-03-23: 5 mL via ORAL
  Filled 2023-03-23: qty 10

## 2023-03-23 MED ORDER — PANTOPRAZOLE SODIUM 40 MG IV SOLR
40.0000 mg | Freq: Two times a day (BID) | INTRAVENOUS | Status: DC
  Administered 2023-03-23 – 2023-03-26 (×8): 40 mg via INTRAVENOUS
  Filled 2023-03-23 (×8): qty 10

## 2023-03-23 MED ORDER — INSULIN ASPART 100 UNIT/ML IJ SOLN
0.0000 [IU] | Freq: Three times a day (TID) | INTRAMUSCULAR | Status: DC
  Administered 2023-03-23: 5 [IU] via SUBCUTANEOUS
  Administered 2023-03-23: 9 [IU] via SUBCUTANEOUS
  Filled 2023-03-23 (×2): qty 1

## 2023-03-23 MED ORDER — LEVOTHYROXINE SODIUM 50 MCG PO TABS
75.0000 ug | ORAL_TABLET | Freq: Every day | ORAL | Status: DC
  Administered 2023-03-23 – 2023-03-26 (×4): 75 ug via ORAL
  Filled 2023-03-23 (×2): qty 1
  Filled 2023-03-23: qty 2
  Filled 2023-03-23: qty 1

## 2023-03-23 MED ORDER — LOPERAMIDE HCL 2 MG PO CAPS
2.0000 mg | ORAL_CAPSULE | ORAL | Status: DC | PRN
  Administered 2023-03-23: 2 mg via ORAL
  Filled 2023-03-23: qty 1

## 2023-03-23 MED ORDER — SODIUM CHLORIDE 0.9 % IV BOLUS: 500.0000 mL | Freq: Once | INTRAVENOUS | Status: DC

## 2023-03-23 MED ORDER — INSULIN ASPART 100 UNIT/ML IJ SOLN
2.0000 [IU] | Freq: Three times a day (TID) | INTRAMUSCULAR | Status: DC
Start: 2023-03-23 — End: 2023-03-26
  Administered 2023-03-23 – 2023-03-24 (×3): 2 [IU] via SUBCUTANEOUS
  Filled 2023-03-23 (×4): qty 1

## 2023-03-23 MED ORDER — FREE WATER
50.0000 mL | Status: DC
  Administered 2023-03-23 – 2023-03-26 (×13): 50 mL

## 2023-03-23 MED ORDER — SODIUM CHLORIDE 0.9% IV SOLUTION: Freq: Once | INTRAVENOUS | Status: AC

## 2023-03-23 MED ORDER — PANCRELIPASE (LIP-PROT-AMYL) 12000-38000 UNITS PO CPEP
24000.0000 [IU] | ORAL_CAPSULE | Freq: Three times a day (TID) | ORAL | Status: DC
  Administered 2023-03-24 – 2023-03-26 (×7): 24000 [IU] via ORAL
  Filled 2023-03-23 (×7): qty 2

## 2023-03-23 NOTE — Assessment & Plan Note (Addendum)
Patient's last 2D echocardiogram was in October 2022 exactly 2 years from this year, showing ejection fraction of 50 to 55% left ventricular showing global hypokinesis, right ventricular systolic function is normal normal pulmonary artery systolic pressure small pericardial effusion posterior to the left ventricle pulmonic valve regurg is moderate. Will monitor patient's I's and O's and daily weights. Currently patient is dehydrated with an elevated serum osmolality and volume contracted will start patient on gentle IV fluid hydration overnight with normal saline patient's received 1 L in the emergency room.

## 2023-03-23 NOTE — Plan of Care (Signed)
  Problem: Education: Goal: Ability to describe self-care measures that may prevent or decrease complications (Diabetes Survival Skills Education) will improve Outcome: Progressing   Problem: Coping: Goal: Ability to adjust to condition or change in health will improve Outcome: Progressing   Problem: Fluid Volume: Goal: Ability to maintain a balanced intake and output will improve Outcome: Progressing   Problem: Health Behavior/Discharge Planning: Goal: Ability to identify and utilize available resources and services will improve Outcome: Progressing   Problem: Nutritional: Goal: Maintenance of adequate nutrition will improve Outcome: Progressing   Problem: Skin Integrity: Goal: Risk for impaired skin integrity will decrease Outcome: Progressing   Problem: Tissue Perfusion: Goal: Adequacy of tissue perfusion will improve Outcome: Progressing   Problem: Nutritional: Goal: Maintenance of adequate nutrition will improve Outcome: Progressing   Problem: Respiratory: Goal: Will regain and/or maintain adequate ventilation Outcome: Progressing   Problem: Activity: Goal: Risk for activity intolerance will decrease Outcome: Progressing   Problem: Nutrition: Goal: Adequate nutrition will be maintained Outcome: Progressing   Problem: Pain Management: Goal: General experience of comfort will improve Outcome: Progressing   Problem: Skin Integrity: Goal: Risk for impaired skin integrity will decrease Outcome: Progressing

## 2023-03-23 NOTE — Assessment & Plan Note (Addendum)
Patient is continued on her hydrocortisone 20 mg daily at breakfast and 10 mg daily at supper.

## 2023-03-23 NOTE — Assessment & Plan Note (Addendum)
Elevated TSH and low free T4 have increased levothyroxine dose to 75 mcg from her existing 50 mcg.

## 2023-03-23 NOTE — ED Notes (Signed)
Leads off for pt to use restroom. RN remains at bedside

## 2023-03-23 NOTE — Progress Notes (Signed)
Feeding started patient states that she cannot tolerate 70 ml/hr but can do between 50-60, Provider informed and ok to decrease rate.

## 2023-03-23 NOTE — ED Notes (Signed)
This RN standby assist with pt while she transferred independently to wheelchair and toilet. Pt complaining of lightheadedness.

## 2023-03-23 NOTE — Assessment & Plan Note (Addendum)
Lab Results  Component Value Date   CREATININE 2.00 (H) 03/22/2023   CREATININE 2.19 (H) 03/22/2023   CREATININE 1.30 (H) 02/18/2023  Avoid contrast studies renally dose medications continue with IV fluid hydration expect return to baseline as above.

## 2023-03-23 NOTE — Inpatient Diabetes Management (Signed)
Inpatient Diabetes Program Recommendations  AACE/ADA: New Consensus Statement on Inpatient Glycemic Control (2015)  Target Ranges:  Prepandial:   less than 140 mg/dL      Peak postprandial:   less than 180 mg/dL (1-2 hours)      Critically ill patients:  140 - 180 mg/dL   Lab Results  Component Value Date   GLUCAP 401 (H) 03/23/2023   HGBA1C 9.4 (H) 11/02/2022    Review of Glycemic Control  Latest Reference Range & Units 03/23/23 01:05 03/23/23 02:15 03/23/23 08:29 03/23/23 14:02  Glucose-Capillary 70 - 99 mg/dL 409 (H) 811 (H) 914 (H) 401 (H)  (H): Data is abnormally high  Diabetes history: T1D Outpatient Diabetes medications: Novolog 0-6 units QID, Novolog 2 units TID, Basaglar 5 units BID Current orders for Inpatient glycemic control: Novolog 0-9 units TID, Novolog 2 units TID, Semglee 85 units BID, Cortef 20 mg with BF and 10 mg with dinner  Inpatient Diabetes Program Recommendations:    Patient is very sensitive to insulin.  Please consider:  Novolog 0-6 units TID  Semglee 5 units BID  Will continue to follow while inpatient.  Thank you, Dulce Sellar, MSN, CDCES Diabetes Coordinator Inpatient Diabetes Program [phone] (team pager from 8a-5p)

## 2023-03-23 NOTE — ED Notes (Signed)
Alerted RN to hypotensive pressure, RN at bedside

## 2023-03-23 NOTE — Assessment & Plan Note (Addendum)
Vitals:   03/22/23 1934 03/22/23 1950 03/22/23 2000 03/22/23 2030  BP: (!) 88/66 91/67 110/75 113/75   03/22/23 2130 03/22/23 2200 03/22/23 2230  BP: 124/73 135/76 137/77  Blood pressure has improved with IVF.

## 2023-03-23 NOTE — Assessment & Plan Note (Deleted)
Lab Results  Component Value Date   CREATININE 2.00 (H) 03/22/2023   CREATININE 2.19 (H) 03/22/2023   CREATININE 1.30 (H) 02/18/2023

## 2023-03-23 NOTE — Progress Notes (Signed)
Initial Nutrition Assessment  DOCUMENTATION CODES:   Underweight, Non-severe (moderate) malnutrition in context of chronic illness  INTERVENTION:   Nocturnal tube feeds via j-tube:    Glucerna 1.5@70ml /hr x 14 hrs overnight (1800-0800)   Free water flushes 50ml q4 hours to maintain tube patency    Regimen provides 1470kcal/day, 81g/day protein and 1049ml/day of free water.   NUTRITION DIAGNOSIS:   Moderate Malnutrition related to chronic illness (gastroparesis) as evidenced by mild fat depletion, moderate fat depletion, mild muscle depletion, moderate muscle depletion.  GOAL:   Patient will meet greater than or equal to 90% of their needs  MONITOR:   PO intake, TF tolerance  REASON FOR ASSESSMENT:   Consult Assessment of nutrition requirement/status, Enteral/tube feeding initiation and management  ASSESSMENT:   Pt with medical history significant for insulin-dependent diabetes mellitus type 1, severe gastroparesis, severe malnutrition, status post jejunostomy tube feed, adrenal insufficiency, CKD stage IIIb, hypertension, hypothyroid, coming for generalized weakness.  Pt admitted with type 1 DM with HHS.   Reviewed I/O's: +1.3 L x 24 hours  Pt very familiar to this RD due to multiple prior admissions. Pt with a complicated social situation, which is her largest barrier to care. She often has difficulty attending medical appointments due to lack of childcare. She also reports she has been recommended G-POEM procedure by GI, however, she has been unable to proceed as this procedure is not available locally and is unable to obtain transportation to appointment to [facility] or [place]. She has been unable to obtain endocrinology follow-up due to expired referral and has had difficulty contacting office to change her PCP (current PCP is in [place] and pt does not have the resources to be transported there). Pt shares that she infuses TF intermittently at home and sites her  daughter's sleep schedule and recently her being sick as barriers to compliance. Pt unable to give RD an average of how often she uses TF or for how long. She has not received TF for the past 1.5 weeks since she started getting sick.   Spoke with pt at bedside. She was able to eat most of her lunch and dinner. At baseline tries to eat small, frequent meals at home. Frequently consumed foods include drinkable yogurt, chicken noodle soup, grilled chicken and almond milk. Intake has been variable over the last week and a half. Pt shares she got a cold last week, which impacted her intake, recovered, but symptoms started again on Sunday. Pt did not eat on Monday secondary to not feeling well. She shares that she was taking cough drops, cough syrup, and Nyquill at home; she is unsure if she was using lower sugar versions of these.   Pt shares that her weight has remained the same. She has been feeling weak secondary to sickness. She has also had confusions and nightmares lately, which she attributes to side effects from cough medications. Reviewed wt hx; pt has experienced a 5% wt loss over the past 3 months, which is not significant for time frame.   Pt shares with this RD concern regarding discharge plan. She is unsure if she is able to return home and expressed concern about her ability to care for herself and her children. She reports she has no other options and does not have any other family or friends to live with. Emotional support provided and concerns forwarded to team.   Medications reviewed and include protonix.   Lab Results  Component Value Date   HGBA1C 9.4 (H) 11/02/2022  PTA DM medications are Novolog 0-6 units QID, Novolog 2 units TID, Basaglar 5 units BID.   Labs reviewed: CBGS: 178-401 (inpatient orders for glycemic control are Novolog 0-9 units TID, Novolog 2 units TID, Semglee 85 units BID, Cortef 20 mg with BF and 10 mg with dinner).    NUTRITION - FOCUSED PHYSICAL  EXAM:  Flowsheet Row Most Recent Value  Orbital Region Mild depletion  Upper Arm Region Mild depletion  Thoracic and Lumbar Region No depletion  Buccal Region No depletion  Temple Region Mild depletion  Clavicle Bone Region Moderate depletion  Clavicle and Acromion Bone Region Moderate depletion  Scapular Bone Region Moderate depletion  Dorsal Hand Mild depletion  Patellar Region Mild depletion  Anterior Thigh Region Mild depletion  Posterior Calf Region Mild depletion  Edema (RD Assessment) None  Hair Reviewed  Eyes Reviewed  Mouth Reviewed  Skin Reviewed  Nails Reviewed       Diet Order:   Diet Order             Diet Carb Modified Fluid consistency: Thin; Room service appropriate? Yes  Diet effective now                   EDUCATION NEEDS:   Education needs have been addressed  Skin:  Skin Assessment: Reviewed RN Assessment  Last BM:  Unknown  Height:   Ht Readings from Last 1 Encounters:  03/22/23 5\' 1"  (1.549 m)    Weight:   Wt Readings from Last 1 Encounters:  03/22/23 43.1 kg    Ideal Body Weight:  47.7 kg  BMI:  Body mass index is 17.95 kg/m.  Estimated Nutritional Needs:   Kcal:  1700-1900  Protein:  85-100 grams  Fluid:  > 1.7 L    Levada Schilling, RD, LDN, CDCES Registered Dietitian III Certified Diabetes Care and Education Specialist Please refer to [facility] for RD and/or RD on-call/weekend/after hours pager

## 2023-03-23 NOTE — Progress Notes (Signed)
Day shift nurse stated that patient did not want her tube to be started at 6 pm, they adjusted the time to 8 pm. It took a long time for me to get the feeding pump. Then the feeding in the room is not Glucerna, I ma still waiting for someone to deliver the feeding as none is on the floor. Unit secretary already called 2 time.

## 2023-03-23 NOTE — Assessment & Plan Note (Addendum)
Heparin held due to pancytopenia, which is most likely from meds , pt is on depakote  and will need outpatient Hem Onc consult.  SCD's.

## 2023-03-23 NOTE — Progress Notes (Signed)
Progress Note   Patient: Ashley Camacho MVH:[id number] DOB: [birth year] DOA: 03/22/2023     0 DOS: the patient was seen and examined on 03/23/2023   Brief hospital course: Ashley Camacho is a 32 y.o. female with medical history significant for insulin-dependent diabetes mellitus type 1, severe gastroparesis, severe malnutrition, status post jejunostomy tube feed, adrenal insufficiency, CKD stage IIIb, hypertension, hypothyroid, coming for generalized weakness.  Upon arriving to hospital, glucose was 538, creatinine 2.0, potassium 5.3.  Patient is diagnosed with diabetic hyperosmolality, was placed on insulin drip, glucose was better after 12 hours, restarted insulin subcu.    Principal Problem:   Type 1 diabetes mellitus with hyperosmolar hyperglycemic state (HHS) (HCC) Active Problems:   Thrombocytopenia (HCC)   Acute kidney injury superimposed on chronic kidney disease (HCC)   Hypothyroidism   Chronic combined systolic and diastolic CHF (congestive heart failure) (HCC)   Adrenal insufficiency (HCC)   Essential hypertension   DVT (deep venous thrombosis) (HCC)   Anemia   Diabetes mellitus type 1 with hyperosmolarity (HCC)   Assessment and Plan: *Uncontrolled type 1 diabetes mellitus with hyperosmolar hyperglycemic state (HHS) (HCC) Was started on insulin gtt., glucose much better today.  Restart home dose insulin.  Acute kidney injury on chronic kidney disease stage IIIa. Metabolic acidosis. Hyperkalemia. Patient developed worsening renal function at time of admission probably from dehydration. She received fluids, renal functions better today.  Continue IV fluids to complete 24-hour course.  Recheck BMP tomorrow.  Chronic anemia likely anemia of chronic kidney disease. Mild thrombocytopenia. Patient also has a chronic anemia, has been followed by hematology as outpatient, she had numerous blood transfusion in the past.  Hemoglobin dropped down to 7.2.  He does not have  active bleeding, likely will need blood transfusion again tomorrow.  Due to chronic heart failure, will transfuse 1 unit PRBC.  Diabetic gastroparesis. Severe protein calorie malnutrition. Has history of diabetic gastroparesis, not able to tolerate diet, has G-tube in place, will consult dietitian to start tube feeding.   Hypothyroidism Elevated TSH and low free T4 have increased levothyroxine dose to 75 mcg from her existing 50 mcg.   Chronic combined systolic and diastolic CHF (congestive heart failure) (HCC) Patient has no evidence of volume overload.  Adrenal insufficiency (HCC) Patient is continued on her hydrocortisone 20 mg daily at breakfast and 10 mg daily at supper.  Continue current dose.    Subjective:  Patient has some nausea but no without no vomiting. No diarrhea.  Physical Exam: Vitals:   03/23/23 0630 03/23/23 1100 03/23/23 1112 03/23/23 1210  BP: 102/65 129/78  135/77  Pulse: 68 74  74  Resp: 17 18  16   Temp:   97.9 F (36.6 C) 98.2 F (36.8 C)  TempSrc:   Oral   SpO2: 100% 100%  100%  Weight:      Height:       General exam: Appears calm and comfortable, appears significantly malnourished. Respiratory system: Clear to auscultation. Respiratory effort normal. Cardiovascular system: S1 & S2 heard, RRR. No JVD, murmurs, rubs, gallops or clicks. No pedal edema. Gastrointestinal system: Abdomen is nondistended, soft and nontender. No organomegaly or masses felt. Normal bowel sounds heard. Central nervous system: Alert and oriented. No focal neurological deficits. Extremities: Symmetric 5 x 5 power. Skin: No rashes, lesions or ulcers Psychiatry: Judgement and insight appear normal. Mood & affect appropriate.    Data Reviewed:  Lab results reviewed.  Family Communication: None  Disposition: Status is: Inpatient Remains inpatient  appropriate because: Disease severity, IV treatment.     Time spent: 35 minutes  Author: Marrion Coy,  MD 03/23/2023 1:47 PM  For on call review [email or web address].

## 2023-03-23 NOTE — Assessment & Plan Note (Addendum)
    Latest Ref Rng & Units 03/22/2023   11:01 PM 03/22/2023    8:29 PM 02/18/2023    6:04 AM  CBC  WBC 4.0 - 10.5 K/uL 3.3  3.6  3.3   Hemoglobin 12.0 - 15.0 g/dL 7.5  7.8  7.2   Hematocrit 36.0 - 46.0 % 23.0  23.1  21.1   Platelets 150 - 400 K/uL 120  132  92   Type/ screen. IV PPI.

## 2023-03-23 NOTE — Consult Note (Addendum)
PHARMACY CONSULT NOTE - ELECTROLYTES  Pharmacy Consult for Electrolyte Monitoring and Replacement   Recent Labs: Height: 5\' 1"  (154.9 cm) Weight: 43.1 kg (95 lb) IBW/kg (Calculated) : 47.8 Estimated Creatinine Clearance: 31.5 mL/min (A) (by C-G formula based on SCr of 1.76 mg/dL (H)). Potassium (mmol/L)  Date Value  03/23/2023 4.6  03/01/2013 4.1   Magnesium (mg/dL)  Date Value  16/02/9603 2.4   Calcium (mg/dL)  Date Value  54/01/8118 8.4 (L)   Calcium, Total (mg/dL)  Date Value  14/78/2956 9.1   Albumin (g/dL)  Date Value  21/30/8657 4.2  03/01/2013 3.9   Phosphorus (mg/dL)  Date Value  84/69/6295 3.2   Sodium (mmol/L)  Date Value  03/23/2023 140  03/01/2013 133 (L)   Assessment  Ashley Camacho is a 32 y.o. female presenting with DKA and N/V. PMH significant for T1DM, gastroparesis, and adrenal insufficiency. Pharmacy has been consulted to monitor and replace electrolytes.  Diet: Carb modified MIVF: NS at 75 cc/hr Pertinent medications: N/A  Goal of Therapy: Electrolytes WNL  Plan:  --No electrolyte replacement indicated at this time. Hyperkalemia resolved --Follow-up electrolytes with AM labs tomorrow  Thank you for allowing pharmacy to be a part of this patient's care.  Tressie Ellis 03/23/2023 7:35 AM

## 2023-03-23 NOTE — Assessment & Plan Note (Addendum)
Start pt on HHS protocol with IVF bolus and insulin gtt.  BMP q4 and PRN accucheck.  Monitor in [place] / telemetry.  Car consistent diet.  No nausea vomiting or GI symptoms.  Intake/Output Summary (Last 24 hours) at 03/23/2023 0043 Last data filed at 03/22/2023 2230 Gross per 24 hour  Intake 1000 ml  Output --  Net 1000 ml    sodium chloride     insulin 3.8 Units/hr (03/22/23 2359)   sodium chloride

## 2023-03-23 NOTE — ED Notes (Signed)
Pt has had 2 episodes of loose liquid stools

## 2023-03-23 NOTE — ED Notes (Signed)
Lab called to recollect type and screen

## 2023-03-24 DIAGNOSIS — N189 Chronic kidney disease, unspecified: Secondary | ICD-10-CM | POA: Diagnosis not present

## 2023-03-24 DIAGNOSIS — E870 Hyperosmolality and hypernatremia: Secondary | ICD-10-CM | POA: Diagnosis not present

## 2023-03-24 DIAGNOSIS — E1065 Type 1 diabetes mellitus with hyperglycemia: Secondary | ICD-10-CM | POA: Diagnosis not present

## 2023-03-24 DIAGNOSIS — E1069 Type 1 diabetes mellitus with other specified complication: Secondary | ICD-10-CM | POA: Diagnosis not present

## 2023-03-24 DIAGNOSIS — I5042 Chronic combined systolic (congestive) and diastolic (congestive) heart failure: Secondary | ICD-10-CM | POA: Diagnosis not present

## 2023-03-24 DIAGNOSIS — E440 Moderate protein-calorie malnutrition: Secondary | ICD-10-CM | POA: Insufficient documentation

## 2023-03-24 DIAGNOSIS — N179 Acute kidney failure, unspecified: Secondary | ICD-10-CM | POA: Diagnosis not present

## 2023-03-24 LAB — CBC
HCT: 29.1 % — ABNORMAL LOW (ref 36.0–46.0)
Hemoglobin: 10.2 g/dL — ABNORMAL LOW (ref 12.0–15.0)
MCH: 29.2 pg (ref 26.0–34.0)
MCHC: 35.1 g/dL (ref 30.0–36.0)
MCV: 83.4 fL (ref 80.0–100.0)
Platelets: 124 10*3/uL — ABNORMAL LOW (ref 150–400)
RBC: 3.49 MIL/uL — ABNORMAL LOW (ref 3.87–5.11)
RDW: 13.3 % (ref 11.5–15.5)
WBC: 3.9 10*3/uL — ABNORMAL LOW (ref 4.0–10.5)
nRBC: 0 % (ref 0.0–0.2)

## 2023-03-24 LAB — TYPE AND SCREEN
ABO/RH(D): A POS
Antibody Screen: NEGATIVE
Unit division: 0

## 2023-03-24 LAB — BASIC METABOLIC PANEL
Anion gap: 10 (ref 5–15)
BUN: 28 mg/dL — ABNORMAL HIGH (ref 6–20)
CO2: 18 mmol/L — ABNORMAL LOW (ref 22–32)
Calcium: 8.1 mg/dL — ABNORMAL LOW (ref 8.9–10.3)
Chloride: 108 mmol/L (ref 98–111)
Creatinine, Ser: 1.48 mg/dL — ABNORMAL HIGH (ref 0.44–1.00)
GFR, Estimated: 48 mL/min — ABNORMAL LOW (ref >60)
Glucose, Bld: 374 mg/dL — ABNORMAL HIGH (ref 70–99)
Potassium: 5.7 mmol/L — ABNORMAL HIGH (ref 3.5–5.1)
Sodium: 136 mmol/L (ref 135–145)

## 2023-03-24 LAB — GLUCOSE, CAPILLARY
Glucose-Capillary: 144 mg/dL — ABNORMAL HIGH (ref 70–99)
Glucose-Capillary: 196 mg/dL — ABNORMAL HIGH (ref 70–99)
Glucose-Capillary: 198 mg/dL — ABNORMAL HIGH (ref 70–99)
Glucose-Capillary: 224 mg/dL — ABNORMAL HIGH (ref 70–99)
Glucose-Capillary: 242 mg/dL — ABNORMAL HIGH (ref 70–99)
Glucose-Capillary: 25 mg/dL — CL (ref 70–99)
Glucose-Capillary: 314 mg/dL — ABNORMAL HIGH (ref 70–99)
Glucose-Capillary: 365 mg/dL — ABNORMAL HIGH (ref 70–99)
Glucose-Capillary: 93 mg/dL (ref 70–99)

## 2023-03-24 LAB — PHOSPHORUS: Phosphorus: 2.1 mg/dL — ABNORMAL LOW (ref 2.5–4.6)

## 2023-03-24 LAB — BPAM RBC
Blood Product Expiration Date: 202411212359
ISSUE DATE / TIME: 202410301701
Unit Type and Rh: 6200

## 2023-03-24 LAB — POTASSIUM: Potassium: 4.4 mmol/L (ref 3.5–5.1)

## 2023-03-24 LAB — MAGNESIUM: Magnesium: 2 mg/dL (ref 1.7–2.4)

## 2023-03-24 MED ORDER — STERILE WATER FOR INJECTION IV SOLN
INTRAVENOUS | Status: DC
  Filled 2023-03-24 (×2): qty 150

## 2023-03-24 MED ORDER — METHOCARBAMOL 500 MG PO TABS
500.0000 mg | ORAL_TABLET | Freq: Three times a day (TID) | ORAL | Status: DC | PRN
  Administered 2023-03-25: 500 mg via ORAL
  Filled 2023-03-24: qty 1

## 2023-03-24 MED ORDER — INSULIN GLARGINE-YFGN 100 UNIT/ML ~~LOC~~ SOLN
5.0000 [IU] | Freq: Every day | SUBCUTANEOUS | Status: DC
Start: 2023-03-25 — End: 2023-03-26
  Administered 2023-03-25: 5 [IU] via SUBCUTANEOUS
  Filled 2023-03-24 (×2): qty 0.05

## 2023-03-24 MED ORDER — SODIUM ZIRCONIUM CYCLOSILICATE 10 G PO PACK
10.0000 g | PACK | Freq: Once | ORAL | Status: AC
  Administered 2023-03-24: 10 g via ORAL
  Filled 2023-03-24: qty 1

## 2023-03-24 MED ORDER — INSULIN ASPART 100 UNIT/ML IJ SOLN
0.0000 [IU] | INTRAMUSCULAR | Status: DC
Start: 2023-03-24 — End: 2023-03-26
  Administered 2023-03-24: 9 [IU] via SUBCUTANEOUS
  Administered 2023-03-24: 3 [IU] via SUBCUTANEOUS
  Administered 2023-03-24: 1 [IU] via SUBCUTANEOUS
  Administered 2023-03-24 – 2023-03-25 (×2): 2 [IU] via SUBCUTANEOUS
  Administered 2023-03-25: 5 [IU] via SUBCUTANEOUS
  Administered 2023-03-25: 3 [IU] via SUBCUTANEOUS
  Administered 2023-03-25: 2 [IU] via SUBCUTANEOUS
  Administered 2023-03-25 – 2023-03-26 (×2): 3 [IU] via SUBCUTANEOUS
  Filled 2023-03-24 (×10): qty 1

## 2023-03-24 MED ORDER — INSULIN ASPART 100 UNIT/ML IJ SOLN
15.0000 [IU] | Freq: Once | INTRAMUSCULAR | Status: AC
  Administered 2023-03-24: 15 [IU] via SUBCUTANEOUS
  Filled 2023-03-24: qty 1

## 2023-03-24 MED ORDER — INSULIN GLARGINE-YFGN 100 UNIT/ML ~~LOC~~ SOLN
5.0000 [IU] | Freq: Two times a day (BID) | SUBCUTANEOUS | Status: DC
  Administered 2023-03-24: 5 [IU] via SUBCUTANEOUS
  Filled 2023-03-24 (×2): qty 0.05

## 2023-03-24 MED ORDER — DEXTROSE 50 % IV SOLN: 1.0000 | Freq: Once | INTRAVENOUS | Status: DC

## 2023-03-24 MED ORDER — GLUCERNA 1.5 CAL PO LIQD
1000.0000 mL | ORAL | Status: DC
  Administered 2023-03-24 – 2023-03-25 (×2): 1000 mL

## 2023-03-24 MED ORDER — SODIUM PHOSPHATES 45 MMOLE/15ML IV SOLN
15.0000 mmol | Freq: Once | INTRAVENOUS | Status: AC
  Administered 2023-03-24: 15 mmol via INTRAVENOUS
  Filled 2023-03-24: qty 5

## 2023-03-24 NOTE — Progress Notes (Signed)
IV Team attempted PIV placement with ultrasound x2. Unsuccessful. Will leave consult for night shift IV RN to attempt.

## 2023-03-24 NOTE — Progress Notes (Signed)
Pt CBG checked at 1439 and was 25mg /dl. MD notified and 50ml of Dextrose ordered and given with 4 oz of orange juice. Blood glucose rechecked at 1500 and was 242 mg/dl Pt CBG at 016 at 0109.

## 2023-03-24 NOTE — Consult Note (Signed)
PHARMACY CONSULT NOTE - ELECTROLYTES  Pharmacy Consult for Electrolyte Monitoring and Replacement   Recent Labs: Height: 5\' 1"  (154.9 cm) Weight: 44.4 kg (97 lb 14.2 oz) IBW/kg (Calculated) : 47.8 Estimated Creatinine Clearance: 38.6 mL/min (A) (by C-G formula based on SCr of 1.48 mg/dL (H)). Potassium (mmol/L)  Date Value  03/24/2023 5.7 (H)  03/01/2013 4.1   Magnesium (mg/dL)  Date Value  14/78/2956 2.0   Calcium (mg/dL)  Date Value  21/30/8657 8.1 (L)   Calcium, Total (mg/dL)  Date Value  84/69/6295 9.1   Albumin (g/dL)  Date Value  28/41/3244 4.2  03/01/2013 3.9   Phosphorus (mg/dL)  Date Value  05/26/7251 2.1 (L)   Sodium (mmol/L)  Date Value  03/24/2023 136  03/01/2013 133 (L)   Assessment  Ashley Camacho is a 32 y.o. female presenting with DKA and N/V. PMH significant for T1DM, gastroparesis, and adrenal insufficiency. Pharmacy has been consulted to monitor and replace electrolytes.  Diet: Carb modified MIVF: NS at 75 cc/hr Pertinent medications: N/A  Goal of Therapy: Electrolytes WNL  Plan:  --K+ 5.7 > Lokelma x 1 given this AM. Repeat K+ this afternoon is within normal limits.  --Phos 2.1. No replacement warranted. Will consider replacing if downtrends tomorrow.  --Follow-up electrolytes with AM labs tomorrow  Thank you for allowing pharmacy to be a part of this patient's care.  Elliot Gurney, PharmD, BCPS Clinical Pharmacist  03/24/2023 1:09 PM

## 2023-03-24 NOTE — Progress Notes (Signed)
Pt glucose check at 4am read 314, Patietn on tube feeding , no insulin order, Dr. Arville Care informed same order once dose of Novolog insulin. See [facility]

## 2023-03-24 NOTE — Progress Notes (Signed)
Introduced patient to role of Statistician. Patient well-known to nurse navigator from previous admissions. Patient sitting in bed, pleasant with flat affect.  Patient states she does NOT currently have a PCP, which has been an issue with her trying to refill prescriptions. Pt requests to have appointment with [facility]. Will set this up. In the past, patient has denied mental health assistance. Pt now agreeable to having referral sent to [facility].  Patient struggles with chronic illness. She lives in a house with her two daughters who are 2 yrs and 12 yrs and her children's father.  Patient does NOT drive. She states her mother transports her to and from appointments. Denies any SDOH needs.

## 2023-03-24 NOTE — Discharge Instructions (Addendum)
Your nurse navigator Algis Downs may be reached at [phone]  [facility] does NOT take [facility] for insurance. If you would like to contact medicaid and change it to [facility], they would be able to provide services.  Another option is: [facility] [address], [place], [place] [address] [phone] Intakes are completed M, W, F beginning at 8:30am. Please arrive at this time, and be prepared to wait, as intakes are completed throughout the day on a first come/first serve basis

## 2023-03-24 NOTE — Progress Notes (Signed)
Progress Note   Patient: Ashley Camacho ZOX:[id number] DOB: [birth year] DOA: 03/22/2023     1 DOS: the patient was seen and examined on 03/24/2023   Brief hospital course: Ashley Camacho is a 32 y.o. female with medical history significant for insulin-dependent diabetes mellitus type 1, severe gastroparesis, severe malnutrition, status post jejunostomy tube feed, adrenal insufficiency, CKD stage IIIb, hypertension, hypothyroid, coming for generalized weakness.  Upon arriving to hospital, glucose was 538, creatinine 2.0, potassium 5.3.  Patient is diagnosed with diabetic hyperosmolality, was placed on insulin drip, glucose was better after 12 hours, restarted insulin subcu.    Principal Problem:   Type 1 diabetes mellitus with hyperosmolar hyperglycemic state (HHS) (HCC) Active Problems:   Thrombocytopenia (HCC)   Acute kidney injury superimposed on chronic kidney disease (HCC)   Hypothyroidism   Chronic combined systolic and diastolic CHF (congestive heart failure) (HCC)   Adrenal insufficiency (HCC)   Essential hypertension   DVT (deep venous thrombosis) (HCC)   Anemia   Diabetes mellitus type 1 with hyperosmolarity (HCC)   Malnutrition of moderate degree   Assessment and Plan: *Uncontrolled type 1 diabetes mellitus with hyperosmolar hyperglycemic state (HHS) (HCC) Hyperglycemia initially improved, then went up again.  Apparently patient was taking insulin glargine twice a day, will change his dose.  Also started nocturnal tube feeding, sliding scale insulin will be every 4 hours, also added scheduled NovoLog.   Acute kidney injury on chronic kidney disease stage IIIa. Hyperkalemia. Non anion gap metabolic acidosis. Hypophosphatemia. Renal function has back to baseline, potassium still high, received Lokelma and sodium bicarb drip.  Potassium has normalized. Replete phosphate.  Chronic diarrhea. 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  Started pancreatic enzymes.  Also add Imodium  as needed.   Chronic anemia likely anemia of chronic kidney disease. Pancytopenia. Patient is a followed by hematology as outpatient for chronic anemia.  She received 1 unit of PRBC, hemoglobin went up to 10.2.  Continue to follow.   Diabetic gastroparesis. Moderate protein calorie malnutrition.  Not severe malnutrition. Has history of diabetic gastroparesis, not able to tolerate diet, has G-tube in place, nocturnal tube feeding started.     Hypothyroidism Elevated TSH and low free T4 have increased levothyroxine dose to 75 mcg from her existing 50 mcg.     Chronic combined systolic and diastolic CHF (congestive heart failure) (HCC) Patient has no evidence of volume overload.   Adrenal insufficiency (HCC) Patient is continued on her hydrocortisone 20 mg daily at breakfast and 10 mg daily at supper.  Continue current dose.        Subjective:  Patient still has a poor appetite, no additional nausea vomiting.  Has some loose stools.  Physical Exam: Vitals:   03/24/23 0214 03/24/23 0410 03/24/23 0745 03/24/23 0757  BP:  (!) 164/92 113/66 114/81  Pulse:  98 84 85  Resp:  18 16 16   Temp:  98.4 F (36.9 C) 98.3 F (36.8 C) 98.5 F (36.9 C)  TempSrc:  Oral    SpO2:  100% 93% 100%  Weight: 44.4 kg     Height:       General exam: Appears calm and comfortable  Respiratory system: Clear to auscultation. Respiratory effort normal. Cardiovascular system: S1 & S2 heard, RRR. No JVD, murmurs, rubs, gallops or clicks. No pedal edema. Gastrointestinal system: Abdomen is nondistended, soft and nontender. No organomegaly or masses felt. Normal bowel sounds heard. Central nervous system: Alert and oriented. No focal neurological deficits. Extremities: Symmetric 5 x 5 power. Skin:  No rashes, lesions or ulcers Psychiatry: Judgement and insight appear normal. Mood & affect appropriate.    Data Reviewed:  Lab results reviewed.  Family Communication: None  Disposition: Status is:  Inpatient Remains inpatient appropriate because: Severity of disease, IV treatment.     Time spent: 50 minutes  Author: Marrion Coy, MD 03/24/2023 1:15 PM  For on call review [email or web address].

## 2023-03-25 DIAGNOSIS — N179 Acute kidney failure, unspecified: Secondary | ICD-10-CM | POA: Diagnosis not present

## 2023-03-25 DIAGNOSIS — N189 Chronic kidney disease, unspecified: Secondary | ICD-10-CM | POA: Diagnosis not present

## 2023-03-25 DIAGNOSIS — E2740 Unspecified adrenocortical insufficiency: Secondary | ICD-10-CM | POA: Diagnosis not present

## 2023-03-25 DIAGNOSIS — E870 Hyperosmolality and hypernatremia: Secondary | ICD-10-CM | POA: Diagnosis not present

## 2023-03-25 DIAGNOSIS — E1069 Type 1 diabetes mellitus with other specified complication: Secondary | ICD-10-CM | POA: Diagnosis not present

## 2023-03-25 DIAGNOSIS — I5042 Chronic combined systolic (congestive) and diastolic (congestive) heart failure: Secondary | ICD-10-CM | POA: Diagnosis not present

## 2023-03-25 DIAGNOSIS — E1065 Type 1 diabetes mellitus with hyperglycemia: Secondary | ICD-10-CM | POA: Diagnosis not present

## 2023-03-25 LAB — BASIC METABOLIC PANEL
Anion gap: 10 (ref 5–15)
BUN: 30 mg/dL — ABNORMAL HIGH (ref 6–20)
CO2: 31 mmol/L (ref 22–32)
Calcium: 7.9 mg/dL — ABNORMAL LOW (ref 8.9–10.3)
Chloride: 98 mmol/L (ref 98–111)
Creatinine, Ser: 1.16 mg/dL — ABNORMAL HIGH (ref 0.44–1.00)
GFR, Estimated: >60 mL/min (ref >60)
Glucose, Bld: 129 mg/dL — ABNORMAL HIGH (ref 70–99)
Potassium: 4.1 mmol/L (ref 3.5–5.1)
Sodium: 139 mmol/L (ref 135–145)

## 2023-03-25 LAB — CBC
HCT: 29.2 % — ABNORMAL LOW (ref 36.0–46.0)
Hemoglobin: 10.1 g/dL — ABNORMAL LOW (ref 12.0–15.0)
MCH: 29.1 pg (ref 26.0–34.0)
MCHC: 34.6 g/dL (ref 30.0–36.0)
MCV: 84.1 fL (ref 80.0–100.0)
Platelets: 112 10*3/uL — ABNORMAL LOW (ref 150–400)
RBC: 3.47 MIL/uL — ABNORMAL LOW (ref 3.87–5.11)
RDW: 13.2 % (ref 11.5–15.5)
WBC: 4.4 10*3/uL (ref 4.0–10.5)
nRBC: 0 % (ref 0.0–0.2)

## 2023-03-25 LAB — GLUCOSE, CAPILLARY
Glucose-Capillary: 114 mg/dL — ABNORMAL HIGH (ref 70–99)
Glucose-Capillary: 152 mg/dL — ABNORMAL HIGH (ref 70–99)
Glucose-Capillary: 167 mg/dL — ABNORMAL HIGH (ref 70–99)
Glucose-Capillary: 238 mg/dL — ABNORMAL HIGH (ref 70–99)
Glucose-Capillary: 240 mg/dL — ABNORMAL HIGH (ref 70–99)
Glucose-Capillary: 268 mg/dL — ABNORMAL HIGH (ref 70–99)

## 2023-03-25 LAB — PHOSPHORUS: Phosphorus: 2.1 mg/dL — ABNORMAL LOW (ref 2.5–4.6)

## 2023-03-25 LAB — MAGNESIUM: Magnesium: 1.8 mg/dL (ref 1.7–2.4)

## 2023-03-25 MED ORDER — SODIUM PHOSPHATES 45 MMOLE/15ML IV SOLN
30.0000 mmol | Freq: Once | INTRAVENOUS | Status: AC
  Administered 2023-03-25: 30 mmol via INTRAVENOUS
  Filled 2023-03-25: qty 10

## 2023-03-25 MED ORDER — DICYCLOMINE HCL 20 MG PO TABS
20.0000 mg | ORAL_TABLET | Freq: Three times a day (TID) | ORAL | Status: DC | PRN
  Administered 2023-03-25: 20 mg via ORAL
  Filled 2023-03-25 (×2): qty 1

## 2023-03-25 NOTE — Progress Notes (Signed)
[facility] is unable to provide services due to her insurance. Will place [facility] information on d/c papers.

## 2023-03-25 NOTE — Inpatient Diabetes Management (Signed)
Inpatient Diabetes Program Recommendations  AACE/ADA: New Consensus Statement on Inpatient Glycemic Control (2015)  Target Ranges:  Prepandial:   less than 140 mg/dL      Peak postprandial:   less than 180 mg/dL (1-2 hours)      Critically ill patients:  140 - 180 mg/dL   Lab Results  Component Value Date   GLUCAP 114 (H) 03/25/2023   HGBA1C 9.4 (H) 11/02/2022    Review of Glycemic Control  Latest Reference Range & Units 03/24/23 08:22 03/24/23 11:50 03/24/23 14:39  Glucose-Capillary 70 - 99 mg/dL 161 (H) Novolog 11 units 198 (H) Novolog 4 units 25 (LL)  (LL): Data is critically low (H): Data is abnormally high  Diabetes history: T1D Outpatient Diabetes medications: Novolog 0-6 units QID, Novolog 2 units TID, Basaglar 5 units BID Current orders for Inpatient glycemic control: Novolog 0-9 units TID, Novolog 2 units TID, Semglee 85 units BID, Cortef 20 mg with BF and 10 mg with dinner  Inpatient Diabetes Program Recommendations:    Hypoglycemia yesterday to 45 mg/dL after correction.  Please decrease correction to:  Novolog 0-6 units TID  Will continue to follow while inpatient.  Thank you, Dulce Sellar, MSN, CDCES Diabetes Coordinator Inpatient Diabetes Program [phone] (team pager from 8a-5p)

## 2023-03-25 NOTE — Plan of Care (Signed)

## 2023-03-25 NOTE — Plan of Care (Signed)
  Problem: Education: Goal: Ability to describe self-care measures that may prevent or decrease complications (Diabetes Survival Skills Education) will improve Outcome: Progressing   Problem: Coping: Goal: Ability to adjust to condition or change in health will improve Outcome: Progressing   Problem: Fluid Volume: Goal: Ability to maintain a balanced intake and output will improve Outcome: Progressing   Problem: Health Behavior/Discharge Planning: Goal: Ability to identify and utilize available resources and services will improve Outcome: Progressing   Problem: Metabolic: Goal: Ability to maintain appropriate glucose levels will improve Outcome: Progressing   Problem: Nutritional: Goal: Maintenance of adequate nutrition will improve Outcome: Progressing   Problem: Tissue Perfusion: Goal: Adequacy of tissue perfusion will improve Outcome: Progressing   Problem: Education: Goal: Ability to describe self-care measures that may prevent or decrease complications (Diabetes Survival Skills Education) will improve Outcome: Progressing

## 2023-03-25 NOTE — Progress Notes (Signed)
Progress Note   Patient: Ashley Camacho NGE:[id number] DOB: Aug 03, 1990 DOA: 03/22/2023     2 DOS: the patient was seen and examined on 03/25/2023   Brief hospital course: TONIANN DICKERSON is a 32 y.o. female with medical history significant for insulin-dependent diabetes mellitus type 1, severe gastroparesis, severe malnutrition, status post jejunostomy tube feed, adrenal insufficiency, CKD stage IIIb, hypertension, hypothyroid, coming for generalized weakness.  Upon arriving to hospital, glucose was 538, creatinine 2.0, potassium 5.3.  Patient is diagnosed with diabetic hyperosmolality, was placed on insulin drip, glucose was better after 12 hours, restarted insulin subcu.    Principal Problem:   Type 1 diabetes mellitus with hyperosmolar hyperglycemic state (HHS) (HCC) Active Problems:   Thrombocytopenia (HCC)   Acute kidney injury superimposed on chronic kidney disease (HCC)   Hypothyroidism   Chronic combined systolic and diastolic CHF (congestive heart failure) (HCC)   Adrenal insufficiency (HCC)   Essential hypertension   DVT (deep venous thrombosis) (HCC)   Anemia   Diabetes mellitus type 1 with hyperosmolarity (HCC)   Malnutrition of moderate degree   Assessment and Plan: Uncontrolled type 1 diabetes mellitus with hyperosmolar hyperglycemic state (HHS) (HCC) Hyperglycemia initially improved, then went up again.   10/31. Patient was taking insulin glargine twice a day, will change his dose.  Also started nocturnal tube feeding, sliding scale insulin will be every 4 hours, also added scheduled NovoLog. 11/1.  Patient developed hypoglycemia this morning, changed insulin glargine back to once a day.   Acute kidney injury on chronic kidney disease stage IIIa. Hyperkalemia. Non anion gap metabolic acidosis. Hypophosphatemia. Potassium Normalized, metabolic acidosis resolved after bicarb drip.  Received IV phosphate yesterday, Phos level still 2.1, will give 30 mmol of sodium  phosphate again today.  Recheck level tomorrow.   Chronic diarrhea. 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  Started pancreatic enzymes.   Patient no longer having diarrhea today, but experienced some abdominal cramping.  This is probably due to change in her bowel habits from liquid to solid.  Follow-up for another day in the hospital.  chronic anemia likely anemia of chronic kidney disease. Pancytopenia. Patient is a followed by hematology as outpatient for chronic anemia.  She received 1 unit of PRBC, hemoglobin went up to 10.2.     Diabetic gastroparesis. Moderate protein calorie malnutrition.  Not severe malnutrition. Has history of diabetic gastroparesis, not able to tolerate diet, has G-tube in place, nocturnal tube feeding started.     Hypothyroidism Elevated TSH and low free T4 have increased levothyroxine dose to 75 mcg from her existing 50 mcg.     Chronic combined systolic and diastolic CHF (congestive heart failure) (HCC) Patient has no evidence of volume overload.   Adrenal insufficiency (HCC) Patient is continued on her hydrocortisone 20 mg daily at breakfast and 10 mg daily at supper.  Continue current dose.        Subjective:  Patient is complaining some abdominal cramping, some nausea.  No vomiting.  No shortness of breath.  Physical Exam: Vitals:   03/24/23 2020 03/25/23 0500 03/25/23 0627 03/25/23 0736  BP: (!) 152/90  135/84 129/86  Pulse: 71  74 79  Resp: 16  16 15   Temp: 98.1 F (36.7 C)  98.1 F (36.7 C)   TempSrc: Oral  Oral   SpO2: 100%  100%   Weight:  47.6 kg    Height:       General exam: Appears calm and comfortable  Respiratory system: Clear to auscultation. Respiratory effort normal.  Cardiovascular system: S1 & S2 heard, RRR. No JVD, murmurs, rubs, gallops or clicks. No pedal edema. Gastrointestinal system: Abdomen is nondistended, soft and nontender. No organomegaly or masses felt. Normal bowel sounds heard. Central nervous system: Alert  and oriented. No focal neurological deficits. Extremities: Symmetric 5 x 5 power. Skin: No rashes, lesions or ulcers Psychiatry: Judgement and insight appear normal. Mood & affect appropriate.    Data Reviewed:  Lab results reviewed.  Family Communication: None  Disposition: Status is: Inpatient Remains inpatient appropriate because: Severity of disease.     Time spent: 35 minutes  Author: Marrion Coy, MD 03/25/2023 11:50 AM  For on call review [email or web address].

## 2023-03-25 NOTE — Consult Note (Signed)
PHARMACY CONSULT NOTE - ELECTROLYTES  Pharmacy Consult for Electrolyte Monitoring and Replacement   Recent Labs: Height: 5\' 1"  (154.9 cm) Weight: 47.6 kg (104 lb 15 oz) IBW/kg (Calculated) : 47.8 Estimated Creatinine Clearance: 52.8 mL/min (A) (by C-G formula based on SCr of 1.16 mg/dL (H)). Potassium (mmol/L)  Date Value  03/25/2023 4.1  03/01/2013 4.1   Magnesium (mg/dL)  Date Value  08/65/7846 1.8   Calcium (mg/dL)  Date Value  96/29/5284 7.9 (L)   Calcium, Total (mg/dL)  Date Value  13/24/4010 9.1   Albumin (g/dL)  Date Value  27/25/3664 4.2  03/01/2013 3.9   Phosphorus (mg/dL)  Date Value  40/34/7425 2.1 (L)   Sodium (mmol/L)  Date Value  03/25/2023 139  03/01/2013 133 (L)   Assessment  Ashley Camacho is a 32 y.o. female presenting with DKA and N/V. PMH significant for T1DM, gastroparesis, and adrenal insufficiency. Pharmacy has been consulted to monitor and replace electrolytes.  Diet: Carb modified MIVF: NS at 75 cc/hr Pertinent medications: N/A  Goal of Therapy: Electrolytes WNL  Plan:  --K+ 4.1 today- no replacement warranted. --Phos 2.1 again this AM. Sodium phosphate IV ordered by medical team  --Follow-up electrolytes with AM labs tomorrow  Thank you for allowing pharmacy to be a part of this patient's care.  Elliot Gurney, PharmD, BCPS Clinical Pharmacist  03/25/2023 10:01 AM

## 2023-03-26 DIAGNOSIS — E870 Hyperosmolality and hypernatremia: Secondary | ICD-10-CM | POA: Diagnosis not present

## 2023-03-26 DIAGNOSIS — N189 Chronic kidney disease, unspecified: Secondary | ICD-10-CM | POA: Diagnosis not present

## 2023-03-26 DIAGNOSIS — N179 Acute kidney failure, unspecified: Secondary | ICD-10-CM | POA: Diagnosis not present

## 2023-03-26 DIAGNOSIS — E1069 Type 1 diabetes mellitus with other specified complication: Secondary | ICD-10-CM | POA: Diagnosis not present

## 2023-03-26 DIAGNOSIS — E1065 Type 1 diabetes mellitus with hyperglycemia: Secondary | ICD-10-CM | POA: Diagnosis not present

## 2023-03-26 DIAGNOSIS — E2740 Unspecified adrenocortical insufficiency: Secondary | ICD-10-CM | POA: Diagnosis not present

## 2023-03-26 DIAGNOSIS — I5042 Chronic combined systolic (congestive) and diastolic (congestive) heart failure: Secondary | ICD-10-CM | POA: Diagnosis not present

## 2023-03-26 LAB — BASIC METABOLIC PANEL
Anion gap: 8 (ref 5–15)
BUN: 35 mg/dL — ABNORMAL HIGH (ref 6–20)
CO2: 31 mmol/L (ref 22–32)
Calcium: 7.4 mg/dL — ABNORMAL LOW (ref 8.9–10.3)
Chloride: 101 mmol/L (ref 98–111)
Creatinine, Ser: 1.2 mg/dL — ABNORMAL HIGH (ref 0.44–1.00)
GFR, Estimated: >60 mL/min (ref >60)
Glucose, Bld: 212 mg/dL — ABNORMAL HIGH (ref 70–99)
Potassium: 4.4 mmol/L (ref 3.5–5.1)
Sodium: 140 mmol/L (ref 135–145)

## 2023-03-26 LAB — PHOSPHORUS: Phosphorus: 3.1 mg/dL (ref 2.5–4.6)

## 2023-03-26 LAB — GLUCOSE, CAPILLARY
Glucose-Capillary: 126 mg/dL — ABNORMAL HIGH (ref 70–99)
Glucose-Capillary: 134 mg/dL — ABNORMAL HIGH (ref 70–99)
Glucose-Capillary: 227 mg/dL — ABNORMAL HIGH (ref 70–99)
Glucose-Capillary: 77 mg/dL (ref 70–99)

## 2023-03-26 MED ORDER — BASAGLAR KWIKPEN 100 UNIT/ML ~~LOC~~ SOPN
7.0000 [IU] | PEN_INJECTOR | Freq: Every day | SUBCUTANEOUS | Status: DC
Start: 2023-03-26 — End: 2023-05-10

## 2023-03-26 MED ORDER — CALCIUM GLUCONATE-NACL 1-0.675 GM/50ML-% IV SOLN
1.0000 g | Freq: Once | INTRAVENOUS | Status: AC
  Administered 2023-03-26: 1000 mg via INTRAVENOUS
  Filled 2023-03-26: qty 50

## 2023-03-26 MED ORDER — INSULIN GLARGINE-YFGN 100 UNIT/ML ~~LOC~~ SOLN
7.0000 [IU] | Freq: Every day | SUBCUTANEOUS | Status: DC
Start: 2023-03-26 — End: 2023-03-26
  Administered 2023-03-26: 7 [IU] via SUBCUTANEOUS
  Filled 2023-03-26: qty 0.07

## 2023-03-26 MED ORDER — LEVOTHYROXINE SODIUM 75 MCG PO TABS: 75.0000 ug | ORAL_TABLET | Freq: Every day | ORAL | 0 refills | Status: DC

## 2023-03-26 MED ORDER — PANCRELIPASE (LIP-PROT-AMYL) 24000-76000 UNITS PO CPEP: 24000.0000 [IU] | ORAL_CAPSULE | Freq: Three times a day (TID) | ORAL | 0 refills | Status: DC

## 2023-03-26 NOTE — Progress Notes (Signed)
MD order received in [facility] to discharge pt home today; TOC's note from 03/26/23 at 12:22 indicates the pt's mother, River Mckercher, to provide transport to [employer] (patient's boyfriend) home; telephone call at [phone] to Marva Hendryx [phone] left voicemail message asking Dois Davenport what time she was planning on coming to pick her daughter up for patient discharge, any questions to feel free to call me at [phone]

## 2023-03-26 NOTE — Consult Note (Signed)
PHARMACY CONSULT NOTE - ELECTROLYTES  Pharmacy Consult for Electrolyte Monitoring and Replacement   Recent Labs: Height: 5\' 1"  (154.9 cm) Weight: 47.6 kg (104 lb 15 oz) IBW/kg (Calculated) : 47.8 Estimated Creatinine Clearance: 51 mL/min (A) (by C-G formula based on SCr of 1.2 mg/dL (H)). Potassium (mmol/L)  Date Value  03/26/2023 4.4  03/01/2013 4.1   Magnesium (mg/dL)  Date Value  16/02/9603 1.8   Calcium (mg/dL)  Date Value  54/01/8118 7.4 (L)   Calcium, Total (mg/dL)  Date Value  14/78/2956 9.1   Albumin (g/dL)  Date Value  21/30/8657 4.2  03/01/2013 3.9   Phosphorus (mg/dL)  Date Value  84/69/6295 3.1   Sodium (mmol/L)  Date Value  03/26/2023 140  03/01/2013 133 (L)   Assessment  Ashley Camacho is a 32 y.o. female presenting with DKA and N/V. PMH significant for T1DM, gastroparesis, and adrenal insufficiency. Pharmacy has been consulted to monitor and replace electrolytes.  Diet: Carb modified. Glucerna @ 50 mL/hr over 14 hrs MIVF: n/a Pertinent medications: N/A  Goal of Therapy: Electrolytes WNL  Plan: 3 --Ca 7.4. Will replete with 1 gram IV Calcium gluconate --Follow-up electrolytes with AM labs tomorrow  Thank you for allowing pharmacy to be a part of this patient's care.  Sharen Hones, PharmD, BCPS Clinical Pharmacist   03/26/2023 7:32 AM

## 2023-03-26 NOTE — Plan of Care (Signed)
  Problem: Education: Goal: Ability to describe self-care measures that may prevent or decrease complications (Diabetes Survival Skills Education) will improve Outcome: Progressing Goal: Individualized Educational Video(s) Outcome: Progressing   Problem: Coping: Goal: Ability to adjust to condition or change in health will improve Outcome: Progressing   Problem: Fluid Volume: Goal: Ability to maintain a balanced intake and output will improve Outcome: Progressing   Problem: Metabolic: Goal: Ability to maintain appropriate glucose levels will improve Outcome: Progressing   Problem: Nutritional: Goal: Maintenance of adequate nutrition will improve Outcome: Progressing Goal: Progress toward achieving an optimal weight will improve Outcome: Progressing

## 2023-03-26 NOTE — Discharge Summary (Signed)
Physician Discharge Summary   Patient: Ashley Camacho MRN: [id number] DOB: Mar 31, 1991  Admit date:     03/22/2023  Discharge date: 03/26/23  Discharge Physician: Marrion Coy   PCP: Pcp, No   Recommendations at discharge:   Follow-up with PCP as scheduled.  Discharge Diagnoses: Principal Problem:   Type 1 diabetes mellitus with hyperosmolar hyperglycemic state (HHS) (HCC) Active Problems:   Thrombocytopenia (HCC)   Acute kidney injury superimposed on chronic kidney disease (HCC)   Hypothyroidism   Chronic combined systolic and diastolic CHF (congestive heart failure) (HCC)   Adrenal insufficiency (HCC)   Essential hypertension   DVT (deep venous thrombosis) (HCC)   Anemia   Diabetes mellitus type 1 with hyperosmolarity (HCC)   Malnutrition of moderate degree  Resolved Problems:   * No resolved hospital problems. *  Hospital Course: Ashley Camacho is a 32 y.o. female with medical history significant for insulin-dependent diabetes mellitus type 1, severe gastroparesis, severe malnutrition, status post jejunostomy tube feed, adrenal insufficiency, CKD stage IIIb, hypertension, hypothyroid, coming for generalized weakness.  Upon arriving to hospital, glucose was 538, creatinine 2.0, potassium 5.3.  Patient is diagnosed with diabetic hyperosmolality, was placed on insulin drip, glucose was better after 12 hours, restarted insulin subcu.   Assessment and Plan: Uncontrolled type 1 diabetes mellitus with hyperosmolar hyperglycemic state (HHS) (HCC) Hyperglycemia initially improved, then went up again.   10/31. Patient was taking insulin glargine twice a day, will change his dose.  Also started nocturnal tube feeding, sliding scale insulin will be every 4 hours, also added scheduled NovoLog. 11/1.  Patient developed hypoglycemia this morning, changed insulin glargine back to once a day. 11/2.  Change long-acting insulin to 7 units daily, resume 2 units of NovoLog 3 times a day and  sliding scale insulin.  Glucose still fluctuates, dose appear to be more appropriate.   Acute kidney injury on chronic kidney disease stage IIIa. Hyperkalemia. Non anion gap metabolic acidosis. Hypophosphatemia. Potassium Normalized, metabolic acidosis resolved after bicarb drip.  Received IV phosphate yesterday, Phos level still 2.1, will give 30 mmol of sodium phosphate again today.   Repeat the lab today renal function still stable, phosphate normalized.  Metabolic acidosis resolved.   Chronic diarrhea. 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  Started pancreatic enzymes.   Diarrhea much better today.   chronic anemia likely anemia of chronic kidney disease. Pancytopenia. Patient is a followed by hematology as outpatient for chronic anemia.  She received 1 unit of PRBC, hemoglobin went up to 10.2.     Diabetic gastroparesis. Moderate protein calorie malnutrition.  Not severe malnutrition. Has history of diabetic gastroparesis, not able to tolerate diet, has G-tube in place, nocturnal tube feeding started.     Hypothyroidism Elevated TSH and low free T4 have increased levothyroxine dose to 75 mcg from her existing 50 mcg.     Chronic combined systolic and diastolic CHF (congestive heart failure) (HCC) Patient has no evidence of volume overload.   Adrenal insufficiency (HCC) Patient is continued on her hydrocortisone 20 mg daily at breakfast and 10 mg daily at supper.  Continue current dose.               Consultants: None Procedures performed: None  Disposition: Home Diet recommendation:  Discharge Diet Orders (From admission, onward)     Start     Ordered   03/26/23 0000  Diet Carb Modified        03/26/23 1037  Carb modified diet DISCHARGE MEDICATION: Allergies as of 03/26/2023   No Known Allergies      Medication List     STOP taking these medications    multivitamin with minerals Tabs tablet   zinc gluconate 50 MG tablet       TAKE these  medications    acetaminophen 500 MG tablet Commonly known as: TYLENOL Take 2 tablets (1,000 mg total) by mouth every 6 (six) hours as needed for headache, fever or moderate pain.   Basaglar KwikPen 100 UNIT/ML Inject 7 Units into the skin daily. What changed:  how much to take when to take this   divalproex 250 MG 24 hr tablet Commonly known as: DEPAKOTE ER Take 1 tablet (250 mg total) by mouth daily.   feeding supplement (GLUCERNA 1.5 CAL) Liqd Place 640 mLs into feeding tube at bedtime.   FLUoxetine 10 MG capsule Commonly known as: PROZAC Take 1 capsule (10 mg total) by mouth daily.   hydrocortisone 10 MG tablet Commonly known as: CORTEF Take 2 tablets (20 mg total) by mouth daily with breakfast AND 1 tablet (10 mg total) daily with supper.   insulin aspart 100 UNIT/ML injection Commonly known as: NovoLOG Inject 2 Units into the skin 3 (three) times daily with meals.   insulin aspart 100 UNIT/ML injection Commonly known as: novoLOG Inject 0-6 Units into the skin 4 (four) times daily - after meals and at bedtime.   levothyroxine 75 MCG tablet Commonly known as: SYNTHROID Take 1 tablet (75 mcg total) by mouth daily at 6 (six) AM. Start taking on: March 27, 2023 What changed:  medication strength how much to take   loperamide 2 MG capsule Commonly known as: IMODIUM Take 2 capsules (4 mg total) by mouth as needed for diarrhea or loose stools.   Pancrelipase (Lip-Prot-Amyl) 24000-76000 units Cpep Take 1 capsule (24,000 Units total) by mouth 3 (three) times daily before meals.   promethazine 25 MG tablet Commonly known as: PHENERGAN Take 0.5 tablets (12.5 mg total) by mouth every 6 (six) hours as needed for up to 10 days for nausea or vomiting.        Follow-up Information     Debera Lat, [place]. Go on 05/10/2023.   Specialty: Physician Assistant Why: This is NEW Patient Appointment. Please arrive 15 min early for your 10am appointment Contact  information: [address] [place] [address] [phone]                Discharge Exam: Filed Weights   03/23/23 1210 03/24/23 0214 03/25/23 0500  Weight: 43.2 kg 44.4 kg 47.6 kg   General exam: Appears calm and comfortable  Respiratory system: Clear to auscultation. Respiratory effort normal. Cardiovascular system: S1 & S2 heard, RRR. No JVD, murmurs, rubs, gallops or clicks. No pedal edema. Gastrointestinal system: Abdomen is nondistended, soft and nontender. No organomegaly or masses felt. Normal bowel sounds heard. Central nervous system: Alert and oriented. No focal neurological deficits. Extremities: Symmetric 5 x 5 power. Skin: No rashes, lesions or ulcers Psychiatry: Judgement and insight appear normal. Mood & affect appropriate.    Condition at discharge: good  The results of significant diagnostics from this hospitalization (including imaging, microbiology, ancillary and laboratory) are listed below for reference.   Imaging Studies: DG Chest 2 View  Result Date: 03/22/2023 CLINICAL DATA:  Generalized weakness, nausea, vomiting, cough, dry heaving EXAM: CHEST - 2 VIEW COMPARISON:  07/27/2022 FINDINGS: The heart size and mediastinal contours are within normal limits. Both  lungs are clear. The visualized skeletal structures are unremarkable. IMPRESSION: No active cardiopulmonary disease. Electronically Signed   By: Minerva Fester M.D.   On: 03/22/2023 23:30   CT HEAD WO CONTRAST ( )  Result Date: 03/22/2023 CLINICAL DATA:  Headache, generalized weakness, neuro deficit, vomiting EXAM: CT HEAD WITHOUT CONTRAST TECHNIQUE: Contiguous axial images were obtained from the base of the skull through the vertex without intravenous contrast. RADIATION DOSE REDUCTION: This exam was performed according to the departmental dose-optimization program which includes automated exposure control, adjustment of the mA and/or kV according to patient size and/or use of  iterative reconstruction technique. COMPARISON:  CT 07/27/2022 FINDINGS: Brain: No intracranial hemorrhage, mass effect, or evidence of acute infarct. No hydrocephalus. No extra-axial fluid collection. Vascular: No hyperdense vessel or unexpected calcification. Skull: No fracture or focal lesion. Sinuses/Orbits: No mucosal thickening in the ethmoid air cells. Other: None. IMPRESSION: No acute intracranial abnormality. Electronically Signed   By: Minerva Fester M.D.   On: 03/22/2023 23:13    Microbiology: Results for orders placed or performed during the hospital encounter of 03/22/23  Resp panel by RT-PCR (RSV, Flu A&B, Covid) Anterior Nasal Swab     Status: None   Collection Time: 03/22/23  9:11 PM   Specimen: Anterior Nasal Swab  Result Value Ref Range Status   SARS Coronavirus 2 by RT PCR NEGATIVE NEGATIVE Final    Comment: (NOTE) SARS-CoV-2 target nucleic acids are NOT DETECTED.  The SARS-CoV-2 RNA is generally detectable in upper respiratory specimens during the acute phase of infection. The lowest concentration of SARS-CoV-2 viral copies this assay can detect is 138 copies/mL. A negative result does not preclude SARS-Cov-2 infection and should not be used as the sole basis for treatment or other patient management decisions. A negative result may occur with  improper specimen collection/handling, submission of specimen other than nasopharyngeal swab, presence of viral mutation(s) within the areas targeted by this assay, and inadequate number of viral copies(<138 copies/mL). A negative result must be combined with clinical observations, patient history, and epidemiological information. The expected result is Negative.  Fact Sheet for Patients:  BloggerCourse.com  Fact Sheet for Healthcare Providers:  SeriousBroker.it  This test is no t yet approved or cleared by the [place] FDA and  has been authorized for detection and/or  diagnosis of SARS-CoV-2 by FDA under an Emergency Use Authorization (EUA). This EUA will remain  in effect (meaning this test can be used) for the duration of the COVID-19 declaration under Section 564(b)(1) of the Act, 21 U.S.C.section 360bbb-3(b)(1), unless the authorization is terminated  or revoked sooner.       Influenza A by PCR NEGATIVE NEGATIVE Final   Influenza B by PCR NEGATIVE NEGATIVE Final    Comment: (NOTE) The Xpert Xpress SARS-CoV-2/FLU/RSV plus assay is intended as an aid in the diagnosis of influenza from Nasopharyngeal swab specimens and should not be used as a sole basis for treatment. Nasal washings and aspirates are unacceptable for Xpert Xpress SARS-CoV-2/FLU/RSV testing.  Fact Sheet for Patients: BloggerCourse.com  Fact Sheet for Healthcare Providers: SeriousBroker.it  This test is not yet approved or cleared by the [place] FDA and has been authorized for detection and/or diagnosis of SARS-CoV-2 by FDA under an Emergency Use Authorization (EUA). This EUA will remain in effect (meaning this test can be used) for the duration of the COVID-19 declaration under Section 564(b)(1) of the Act, 21 U.S.C. section 360bbb-3(b)(1), unless the authorization is terminated or revoked.     Resp  Syncytial Virus by PCR NEGATIVE NEGATIVE Final    Comment: (NOTE) Fact Sheet for Patients: BloggerCourse.com  Fact Sheet for Healthcare Providers: SeriousBroker.it  This test is not yet approved or cleared by the [place] FDA and has been authorized for detection and/or diagnosis of SARS-CoV-2 by FDA under an Emergency Use Authorization (EUA). This EUA will remain in effect (meaning this test can be used) for the duration of the COVID-19 declaration under Section 564(b)(1) of the Act, 21 U.S.C. section 360bbb-3(b)(1), unless the authorization is terminated  or revoked.  Performed at [facility], [address] [place], [place] [address]     Labs: CBC: Recent Labs  Lab 03/22/23 2029 03/22/23 2301 03/23/23 0444 03/24/23 0435 03/25/23 0709  WBC 3.6* 3.3* 3.3* 3.9* 4.4  NEUTROABS  --  1.9 1.6*  --   --   HGB 7.8* 7.5* 7.2* 10.2* 10.1*  HCT 23.1* 23.0* 21.3* 29.1* 29.2*  MCV 87.8 89.5 88.0 83.4 84.1  PLT 132* 120* 137* 124* 112*   Basic Metabolic Panel: Recent Labs  Lab 03/22/23 2029 03/22/23 2301 03/23/23 0249 03/23/23 0444 03/24/23 0435 03/24/23 1149 03/25/23 0709 03/26/23 0443  NA 130* 132* 139 140 136  --  139 140  K 5.8* 5.3* 3.7 4.6 5.7* 4.4 4.1 4.4  CL 97* 101 109 112* 108  --  98 101  CO2 20* 21* 23 21* 18*  --  31 31  GLUCOSE 714* 595* 157* 161* 374*  --  129* 212*  BUN 38* 36* 34* 32* 28*  --  30* 35*  CREATININE 2.19* 2.00* 1.91* 1.76* 1.48*  --  1.16* 1.20*  CALCIUM 9.0 8.5* 8.5* 8.4* 8.1*  --  7.9* 7.4*  MG 2.4 2.1  --  2.4 2.0  --  1.8  --   PHOS  --   --   --  3.2 2.1*  --  2.1* 3.1   Liver Function Tests: Recent Labs  Lab 03/22/23 2029  AST 15  ALT 17  ALKPHOS 89  BILITOT 1.2  PROT 7.5  ALBUMIN 4.2   CBG: Recent Labs  Lab 03/25/23 1606 03/25/23 2046 03/26/23 0057 03/26/23 0523 03/26/23 0808  GLUCAP 167* 238* 126* 227* 77    Discharge time spent: greater than 30 minutes.  Signed: Marrion Coy, MD [facility] 03/26/2023

## 2023-03-26 NOTE — TOC Progression Note (Signed)
Transition of Care [facility] Camacho) - Progression Note    Patient Details  Name: Ashley Camacho MRN: [id number] Date of Birth: 1990/12/03  Transition of Care [facility] Camacho) CM/SW Contact  Hetty Ely, RN Phone Number: 03/26/2023, 12:22 PM  Clinical Narrative: Spoke at length with patient's boyfriend Ashley Camacho and patient via in room on CM cell phone. Cruz consents with  patient returning home with him and the kids, two girls ages 60 and 44 until she make arrangement with Caseworker to find housing/ counseling and daycare services. CM also provided patient with list of counseling in [place] And agrees to calling and scheduling appointment. Mother to provide transport to [facility]. Following the phone conversation, patient denies domestic violence and feels safe returning to [place].          Expected Discharge Plan and Services         Expected Discharge Date: 03/26/23                                     Social Determinants of Health (SDOH) Interventions SDOH Screenings   Food Insecurity: No Food Insecurity (03/23/2023)  Housing: Low Risk  (03/23/2023)  Transportation Needs: No Transportation Needs (03/23/2023)  Utilities: Not At Risk (03/23/2023)  Depression (PHQ2-9): Low Risk  (11/11/2020)  Financial Resource Strain: Medium Risk (09/01/2021)   Received from [facility]  Physical Activity: Inactive (08/19/2021)   Received from [facility], [facility]  Social Connections: Unknown (08/19/2021)   Received from [facility]  Tobacco Use: Low Risk  (03/23/2023)    Readmission Risk Interventions    02/17/2023    3:02 PM 11/24/2022   11:04 AM 11/03/2021   10:37 AM  Readmission Risk Prevention Plan  Transportation Screening Complete Complete Complete  Medication Review Oceanographer) Complete Complete Complete  PCP or Specialist appointment within 3-5 days of discharge Complete Complete Complete  HRI or Home  Care Consult Not Complete  Not Complete  HRI or Home Care Consult Pt Refusal Comments N/A  NA  SW Recovery Care/Counseling Consult Complete Complete Not Complete  SW Consult Not Complete Comments   NA  Palliative Care Screening Not Applicable Not Applicable Not Applicable  Skilled Nursing Facility Not Applicable Not Applicable Not Applicable

## 2023-04-04 ENCOUNTER — Inpatient Hospital Stay: Payer: Medicaid Other | Attending: Oncology

## 2023-04-06 ENCOUNTER — Other Ambulatory Visit: Payer: Self-pay

## 2023-04-06 ENCOUNTER — Inpatient Hospital Stay
Admission: EM | Admit: 2023-04-06 | Discharge: 2023-04-10 | DRG: 638 | Disposition: A | Discharge status: Home / Self Care | Payer: Medicaid Other | Attending: Internal Medicine | Admitting: Internal Medicine

## 2023-04-06 ENCOUNTER — Encounter: Payer: Self-pay | Admitting: Family Medicine

## 2023-04-06 DIAGNOSIS — Z794 Long term (current) use of insulin: Secondary | ICD-10-CM | POA: Diagnosis not present

## 2023-04-06 DIAGNOSIS — I10 Essential (primary) hypertension: Secondary | ICD-10-CM | POA: Diagnosis not present

## 2023-04-06 DIAGNOSIS — E1110 Type 2 diabetes mellitus with ketoacidosis without coma: Secondary | ICD-10-CM | POA: Diagnosis not present

## 2023-04-06 DIAGNOSIS — R531 Weakness: Secondary | ICD-10-CM | POA: Diagnosis not present

## 2023-04-06 DIAGNOSIS — N179 Acute kidney failure, unspecified: Secondary | ICD-10-CM | POA: Diagnosis present

## 2023-04-06 DIAGNOSIS — E1010 Type 1 diabetes mellitus with ketoacidosis without coma: Secondary | ICD-10-CM | POA: Diagnosis not present

## 2023-04-06 DIAGNOSIS — E109 Type 1 diabetes mellitus without complications: Secondary | ICD-10-CM | POA: Diagnosis not present

## 2023-04-06 DIAGNOSIS — Z934 Other artificial openings of gastrointestinal tract status: Secondary | ICD-10-CM

## 2023-04-06 DIAGNOSIS — Z79890 Hormone replacement therapy (postmenopausal): Secondary | ICD-10-CM

## 2023-04-06 DIAGNOSIS — Z931 Gastrostomy status: Secondary | ICD-10-CM | POA: Diagnosis not present

## 2023-04-06 DIAGNOSIS — E10649 Type 1 diabetes mellitus with hypoglycemia without coma: Secondary | ICD-10-CM | POA: Diagnosis not present

## 2023-04-06 DIAGNOSIS — E039 Hypothyroidism, unspecified: Secondary | ICD-10-CM | POA: Diagnosis not present

## 2023-04-06 DIAGNOSIS — Z681 Body mass index (BMI) 19 or less, adult: Secondary | ICD-10-CM | POA: Diagnosis not present

## 2023-04-06 DIAGNOSIS — D649 Anemia, unspecified: Secondary | ICD-10-CM | POA: Diagnosis present

## 2023-04-06 DIAGNOSIS — Z79899 Other long term (current) drug therapy: Secondary | ICD-10-CM

## 2023-04-06 DIAGNOSIS — K3184 Gastroparesis: Secondary | ICD-10-CM | POA: Diagnosis present

## 2023-04-06 DIAGNOSIS — Z803 Family history of malignant neoplasm of breast: Secondary | ICD-10-CM

## 2023-04-06 DIAGNOSIS — R1111 Vomiting without nausea: Secondary | ICD-10-CM | POA: Diagnosis not present

## 2023-04-06 DIAGNOSIS — N1832 Chronic kidney disease, stage 3b: Secondary | ICD-10-CM | POA: Diagnosis not present

## 2023-04-06 DIAGNOSIS — E1043 Type 1 diabetes mellitus with diabetic autonomic (poly)neuropathy: Secondary | ICD-10-CM | POA: Diagnosis present

## 2023-04-06 DIAGNOSIS — E440 Moderate protein-calorie malnutrition: Secondary | ICD-10-CM | POA: Diagnosis not present

## 2023-04-06 DIAGNOSIS — E1065 Type 1 diabetes mellitus with hyperglycemia: Secondary | ICD-10-CM

## 2023-04-06 DIAGNOSIS — R112 Nausea with vomiting, unspecified: Secondary | ICD-10-CM | POA: Diagnosis not present

## 2023-04-06 DIAGNOSIS — E2740 Unspecified adrenocortical insufficiency: Secondary | ICD-10-CM | POA: Diagnosis not present

## 2023-04-06 DIAGNOSIS — Z833 Family history of diabetes mellitus: Secondary | ICD-10-CM | POA: Diagnosis not present

## 2023-04-06 DIAGNOSIS — Z801 Family history of malignant neoplasm of trachea, bronchus and lung: Secondary | ICD-10-CM | POA: Diagnosis not present

## 2023-04-06 DIAGNOSIS — Z8249 Family history of ischemic heart disease and other diseases of the circulatory system: Secondary | ICD-10-CM | POA: Diagnosis not present

## 2023-04-06 DIAGNOSIS — R1310 Dysphagia, unspecified: Secondary | ICD-10-CM | POA: Diagnosis present

## 2023-04-06 DIAGNOSIS — F32A Depression, unspecified: Secondary | ICD-10-CM | POA: Diagnosis not present

## 2023-04-06 DIAGNOSIS — R110 Nausea: Secondary | ICD-10-CM | POA: Diagnosis not present

## 2023-04-06 DIAGNOSIS — Z743 Need for continuous supervision: Secondary | ICD-10-CM | POA: Diagnosis not present

## 2023-04-06 LAB — COMPREHENSIVE METABOLIC PANEL
ALT: 10 U/L (ref 0–44)
AST: 14 U/L — ABNORMAL LOW (ref 15–41)
Albumin: 4.2 g/dL (ref 3.5–5.0)
Alkaline Phosphatase: 87 U/L (ref 38–126)
Anion gap: 26 — ABNORMAL HIGH (ref 5–15)
BUN: 49 mg/dL — ABNORMAL HIGH (ref 6–20)
CO2: 12 mmol/L — ABNORMAL LOW (ref 22–32)
Calcium: 8.7 mg/dL — ABNORMAL LOW (ref 8.9–10.3)
Chloride: 93 mmol/L — ABNORMAL LOW (ref 98–111)
Creatinine, Ser: 2.86 mg/dL — ABNORMAL HIGH (ref 0.44–1.00)
GFR, Estimated: 22 mL/min — ABNORMAL LOW (ref >60)
Glucose, Bld: 592 mg/dL (ref 70–99)
Potassium: 5.8 mmol/L — ABNORMAL HIGH (ref 3.5–5.1)
Sodium: 131 mmol/L — ABNORMAL LOW (ref 135–145)
Total Bilirubin: 1.7 mg/dL — ABNORMAL HIGH (ref <1.2)
Total Protein: 8.1 g/dL (ref 6.5–8.1)

## 2023-04-06 LAB — CBC
HCT: 32.8 % — ABNORMAL LOW (ref 36.0–46.0)
Hemoglobin: 10.1 g/dL — ABNORMAL LOW (ref 12.0–15.0)
MCH: 29 pg (ref 26.0–34.0)
MCHC: 30.8 g/dL (ref 30.0–36.0)
MCV: 94.3 fL (ref 80.0–100.0)
Platelets: 211 10*3/uL (ref 150–400)
RBC: 3.48 MIL/uL — ABNORMAL LOW (ref 3.87–5.11)
RDW: 13.5 % (ref 11.5–15.5)
WBC: 7.3 10*3/uL (ref 4.0–10.5)
nRBC: 0 % (ref 0.0–0.2)

## 2023-04-06 LAB — BLOOD GAS, VENOUS
Acid-base deficit: 15.7 mmol/L — ABNORMAL HIGH (ref 0.0–2.0)
Bicarbonate: 11.7 mmol/L — ABNORMAL LOW (ref 20.0–28.0)
O2 Saturation: 84.7 %
Patient temperature: 37
pCO2, Ven: 32 mm[Hg] — ABNORMAL LOW (ref 44–60)
pH, Ven: 7.17 — CL (ref 7.25–7.43)
pO2, Ven: 53 mm[Hg] — ABNORMAL HIGH (ref 32–45)

## 2023-04-06 LAB — BETA-HYDROXYBUTYRIC ACID: Beta-Hydroxybutyric Acid: >8 mmol/L — ABNORMAL HIGH (ref 0.05–0.27)

## 2023-04-06 LAB — CBG MONITORING, ED
Glucose-Capillary: 599 mg/dL (ref 70–99)
Glucose-Capillary: 497 mg/dL — ABNORMAL HIGH (ref 70–99)
Glucose-Capillary: 464 mg/dL — ABNORMAL HIGH (ref 70–99)
Glucose-Capillary: 377 mg/dL — ABNORMAL HIGH (ref 70–99)

## 2023-04-06 LAB — GLUCOSE, CAPILLARY: Glucose-Capillary: 333 mg/dL — ABNORMAL HIGH (ref 70–99)

## 2023-04-06 LAB — LIPASE, BLOOD: Lipase: 19 U/L (ref 11–51)

## 2023-04-06 MED ORDER — PANTOPRAZOLE SODIUM 40 MG IV SOLR
40.0000 mg | Freq: Two times a day (BID) | INTRAVENOUS | Status: DC
  Administered 2023-04-06 – 2023-04-09 (×5): 40 mg via INTRAVENOUS
  Filled 2023-04-06 (×6): qty 10

## 2023-04-06 MED ORDER — LACTATED RINGERS IV SOLN: INTRAVENOUS | Status: DC

## 2023-04-06 MED ORDER — INSULIN REGULAR(HUMAN) IN NACL 100-0.9 UT/100ML-% IV SOLN: INTRAVENOUS | Status: DC

## 2023-04-06 MED ORDER — HYDROCORTISONE 10 MG PO TABS
20.0000 mg | ORAL_TABLET | Freq: Every day | ORAL | Status: DC
  Administered 2023-04-07: 20 mg via ORAL
  Filled 2023-04-06: qty 2

## 2023-04-06 MED ORDER — ORAL CARE MOUTH RINSE: 15.0000 mL | OROMUCOSAL | Status: DC | PRN

## 2023-04-06 MED ORDER — ACETAMINOPHEN 650 MG RE SUPP: 650.0000 mg | Freq: Four times a day (QID) | RECTAL | Status: DC | PRN

## 2023-04-06 MED ORDER — LACTATED RINGERS IV BOLUS: 20.0000 mL/kg | Freq: Once | INTRAVENOUS | Status: DC

## 2023-04-06 MED ORDER — SODIUM CHLORIDE 0.9 % IV SOLN
12.5000 mg | Freq: Once | INTRAVENOUS | Status: AC
  Administered 2023-04-06: 12.5 mg via INTRAVENOUS
  Filled 2023-04-06: qty 12.5

## 2023-04-06 MED ORDER — LEVOTHYROXINE SODIUM 50 MCG PO TABS: 75.0000 ug | ORAL_TABLET | Freq: Every day | ORAL | Status: DC

## 2023-04-06 MED ORDER — SODIUM CHLORIDE 0.9 % IV BOLUS
1000.0000 mL | Freq: Once | INTRAVENOUS | Status: AC
  Administered 2023-04-06: 1000 mL via INTRAVENOUS

## 2023-04-06 MED ORDER — ONDANSETRON HCL 4 MG/2ML IJ SOLN
4.0000 mg | Freq: Four times a day (QID) | INTRAMUSCULAR | Status: DC | PRN
  Administered 2023-04-06 – 2023-04-08 (×4): 4 mg via INTRAVENOUS
  Filled 2023-04-06 (×4): qty 2

## 2023-04-06 MED ORDER — METOCLOPRAMIDE HCL 5 MG/ML IJ SOLN
10.0000 mg | Freq: Four times a day (QID) | INTRAMUSCULAR | Status: DC
  Administered 2023-04-06 – 2023-04-09 (×9): 10 mg via INTRAVENOUS
  Filled 2023-04-06 (×9): qty 2

## 2023-04-06 MED ORDER — MORPHINE SULFATE (PF) 2 MG/ML IV SOLN
2.0000 mg | INTRAVENOUS | Status: DC | PRN
  Administered 2023-04-06: 2 mg via INTRAVENOUS
  Filled 2023-04-06: qty 1

## 2023-04-06 MED ORDER — ENOXAPARIN SODIUM 30 MG/0.3ML IJ SOSY
30.0000 mg | PREFILLED_SYRINGE | INTRAMUSCULAR | Status: DC
  Administered 2023-04-07 – 2023-04-10 (×4): 30 mg via SUBCUTANEOUS
  Filled 2023-04-06 (×4): qty 0.3

## 2023-04-06 MED ORDER — FLUOXETINE HCL 10 MG PO CAPS
10.0000 mg | ORAL_CAPSULE | Freq: Every day | ORAL | Status: DC
  Filled 2023-04-06: qty 1

## 2023-04-06 MED ORDER — INSULIN REGULAR(HUMAN) IN NACL 100-0.9 UT/100ML-% IV SOLN
INTRAVENOUS | Status: DC
  Administered 2023-04-06: 4.2 [IU]/h via INTRAVENOUS
  Filled 2023-04-06: qty 100

## 2023-04-06 MED ORDER — LOPERAMIDE HCL 2 MG PO CAPS: 4.0000 mg | ORAL_CAPSULE | ORAL | Status: DC | PRN

## 2023-04-06 MED ORDER — DIVALPROEX SODIUM ER 250 MG PO TB24
250.0000 mg | ORAL_TABLET | Freq: Every day | ORAL | Status: DC
  Administered 2023-04-07 – 2023-04-10 (×4): 250 mg via ORAL
  Filled 2023-04-06 (×4): qty 1

## 2023-04-06 MED ORDER — DEXTROSE IN LACTATED RINGERS 5 % IV SOLN: INTRAVENOUS | Status: DC

## 2023-04-06 MED ORDER — HYDROCORTISONE 10 MG PO TABS
10.0000 mg | ORAL_TABLET | Freq: Every day | ORAL | Status: DC
  Filled 2023-04-06: qty 1

## 2023-04-06 MED ORDER — ONDANSETRON HCL 4 MG PO TABS: 4.0000 mg | ORAL_TABLET | Freq: Four times a day (QID) | ORAL | Status: DC | PRN

## 2023-04-06 MED ORDER — MAGNESIUM HYDROXIDE 400 MG/5ML PO SUSP: 30.0000 mL | Freq: Every day | ORAL | Status: DC | PRN

## 2023-04-06 MED ORDER — TRAZODONE HCL 50 MG PO TABS: 25.0000 mg | ORAL_TABLET | Freq: Every evening | ORAL | Status: DC | PRN

## 2023-04-06 MED ORDER — DEXTROSE 50 % IV SOLN
0.0000 mL | INTRAVENOUS | Status: DC | PRN
  Administered 2023-04-08 (×3): 50 mL via INTRAVENOUS
  Filled 2023-04-06 (×3): qty 50

## 2023-04-06 MED ORDER — SODIUM CHLORIDE 0.9 % IV SOLN: 12.5000 mg | Freq: Four times a day (QID) | INTRAVENOUS | Status: DC | PRN

## 2023-04-06 MED ORDER — ACETAMINOPHEN 325 MG PO TABS: 650.0000 mg | ORAL_TABLET | Freq: Four times a day (QID) | ORAL | Status: DC | PRN

## 2023-04-06 MED ORDER — ONDANSETRON HCL 4 MG/2ML IJ SOLN
4.0000 mg | Freq: Once | INTRAMUSCULAR | Status: AC
  Administered 2023-04-06: 4 mg via INTRAVENOUS
  Filled 2023-04-06: qty 2

## 2023-04-06 NOTE — ED Notes (Signed)
CCMD CALLED TO PLACE PT ON MONITOR

## 2023-04-06 NOTE — Progress Notes (Signed)
PHARMACIST - PHYSICIAN COMMUNICATION  CONCERNING:  Enoxaparin (Lovenox) for DVT Prophylaxis    RECOMMENDATION: Patient was prescribed enoxaprin 40mg  q24 hours for VTE prophylaxis.   There were no vitals filed for this visit.  There is no height or weight on file to calculate BMI.  Estimated Creatinine Clearance: 21.4 mL/min (A) (by C-G formula based on SCr of 2.86 mg/dL (H)).  Patient is candidate for enoxaparin 30mg  every 24 hours based on CrCl <73ml/min or Weight <45kg  DESCRIPTION: Pharmacy has adjusted enoxaparin dose per [facility] policy.  Patient is now receiving enoxaparin 30 mg every 24 hours   Otelia Sergeant, PharmD, [facility] 04/06/2023 10:02 PM

## 2023-04-06 NOTE — ED Notes (Signed)
1st Liter of NS infusing at this time.

## 2023-04-06 NOTE — ED Triage Notes (Signed)
First Nurse Note: Patient to ED via [facility] from home for N/V x2 weeks. Hx of gastroparesis and feeding tube. Had trouble getting her nausea meds at home due to issues with the pharmacy. Decreased output noted. 4mg  IM zofran given  Cbg 575

## 2023-04-06 NOTE — ED Provider Notes (Signed)
[facility] Provider Note    Event Date/Time   First MD Initiated Contact with Patient 04/06/23 1858     (approximate)   History   Nausea   HPI  Ashley Camacho is a 32 year old female with history of T1DM, gastroparesis presenting to the emergency department for evaluation of nausea and vomiting.  Patient reports that shortly after her recent discharge from the hospital she had onset of nausea and vomiting.  Reports that when she is nauseous her blood glucose some drops that she has not reliably been taking her insulin.  Estimates he has been taking it less than 50% of the time.  No fevers or chills.  No abdominal pain.  I did review her discharge summary from 03/26/2023.  At that time, patient presented with weakness, was found to be hyperglycemic without evidence of DKA.  She was placed on an insulin drip for HHS.       Physical Exam   Triage Vital Signs: ED Triage Vitals  Encounter Vitals Group     BP 04/06/23 1634 (!) 116/93     Systolic BP Percentile --      Diastolic BP Percentile --      Pulse Rate 04/06/23 1634 100     Resp 04/06/23 1634 16     Temp 04/06/23 1634 98.2 F (36.8 C)     Temp Source 04/06/23 1634 Oral     SpO2 04/06/23 1634 100 %     Weight --      Height --      Head Circumference --      Peak Flow --      Pain Score 04/06/23 1631 5     Pain Loc --      Pain Education --      Exclude from Growth Chart --     Most recent vital signs: Vitals:   04/06/23 2251 04/06/23 2300  BP: 133/83 125/84  Pulse: (!) 102 100  Resp: (!) 22 (!) 24  Temp: (!) 97.4 F (36.3 C)   SpO2:  100%     General: Awake, interactive  CV:  Regular rate, good peripheral perfusion.  Resp:  Unlabored respirations, lungs clear to auscultation Abd:  Nondistended, soft, no tenderness to palpation, frequent dry heaving Neuro:  Symmetric facial movement, fluid speech   ED Results / Procedures / Treatments   Labs (all labs ordered are listed,  but only abnormal results are displayed) Labs Reviewed  COMPREHENSIVE METABOLIC PANEL - Abnormal; Notable for the following components:      Result Value   Sodium 131 (*)    Potassium 5.8 (*)    Chloride 93 (*)    CO2 12 (*)    Glucose, Bld 592 (*)    BUN 49 (*)    Creatinine, Ser 2.86 (*)    Calcium 8.7 (*)    AST 14 (*)    Total Bilirubin 1.7 (*)    GFR, Estimated 22 (*)    Anion gap 26 (*)    All other components within normal limits  CBC - Abnormal; Notable for the following components:   RBC 3.48 (*)    Hemoglobin 10.1 (*)    HCT 32.8 (*)    All other components within normal limits  BLOOD GAS, VENOUS - Abnormal; Notable for the following components:   pH, Ven 7.17 (*)    pCO2, Ven 32 (*)    pO2, Ven 53 (*)    Bicarbonate 11.7 (*)  Acid-base deficit 15.7 (*)    All other components within normal limits  BETA-HYDROXYBUTYRIC ACID - Abnormal; Notable for the following components:   Beta-Hydroxybutyric Acid >8.00 (*)    All other components within normal limits  BASIC METABOLIC PANEL - Abnormal; Notable for the following components:   CO2 14 (*)    Glucose, Bld 361 (*)    BUN 44 (*)    Creatinine, Ser 2.45 (*)    Calcium 8.2 (*)    GFR, Estimated 26 (*)    Anion gap 17 (*)    All other components within normal limits  GLUCOSE, CAPILLARY - Abnormal; Notable for the following components:   Glucose-Capillary 333 (*)    All other components within normal limits  CBG MONITORING, ED - Abnormal; Notable for the following components:   Glucose-Capillary 599 (*)    All other components within normal limits  CBG MONITORING, ED - Abnormal; Notable for the following components:   Glucose-Capillary 497 (*)    All other components within normal limits  CBG MONITORING, ED - Abnormal; Notable for the following components:   Glucose-Capillary 464 (*)    All other components within normal limits  CBG MONITORING, ED - Abnormal; Notable for the following components:    Glucose-Capillary 377 (*)    All other components within normal limits  MRSA NEXT GEN BY PCR, NASAL  LIPASE, BLOOD  URINALYSIS, ROUTINE W REFLEX MICROSCOPIC  HCG, QUANTITATIVE, PREGNANCY  BASIC METABOLIC PANEL  BASIC METABOLIC PANEL  BASIC METABOLIC PANEL  BETA-HYDROXYBUTYRIC ACID  BETA-HYDROXYBUTYRIC ACID  CBC  BASIC METABOLIC PANEL  BETA-HYDROXYBUTYRIC ACID  POC URINE PREG, ED     EKG EKG independently reviewed interpreted by myself (ER attending) demonstrates:  EKG demonstrates normal sinus rhythm rate of 96, PR 168, QRS 76, QTc 452, nonspecific ST changes noted, no STEMI  RADIOLOGY Imaging independently reviewed and interpreted by myself demonstrates:    PROCEDURES:  Critical Care performed: Yes, see critical care procedure note(s)  CRITICAL CARE Performed by: Trinna Post   Total critical care time: 32 minutes  Critical care time was exclusive of separately billable procedures and treating other patients.  Critical care was necessary to treat or prevent imminent or life-threatening deterioration.  Critical care was time spent personally by me on the following activities: development of treatment plan with patient and/or surrogate as well as nursing, discussions with consultants, evaluation of patient's response to treatment, examination of patient, obtaining history from patient or surrogate, ordering and performing treatments and interventions, ordering and review of laboratory studies, ordering and review of radiographic studies, pulse oximetry and re-evaluation of patient's condition.   Procedures   MEDICATIONS ORDERED IN ED: Medications  insulin regular, human (MYXREDLIN) 100 units/ 100 mL infusion (4.4 Units/hr Intravenous Handoff 04/06/23 2309)  lactated ringers infusion ( Intravenous Infusion Verify 04/06/23 2300)  dextrose 5 % in lactated ringers infusion (0 mLs Intravenous Hold 04/06/23 2047)  dextrose 50 % solution 0-50 mL (has no administration in time  range)  FLUoxetine (PROZAC) capsule 10 mg (has no administration in time range)  hydrocortisone (CORTEF) tablet 20 mg (has no administration in time range)    And  hydrocortisone (CORTEF) tablet 10 mg (has no administration in time range)  levothyroxine (SYNTHROID) tablet 75 mcg (has no administration in time range)  loperamide (IMODIUM) capsule 4 mg (has no administration in time range)  divalproex (DEPAKOTE ER) 24 hr tablet 250 mg (has no administration in time range)  enoxaparin (LOVENOX) injection 30 mg (has no  administration in time range)  acetaminophen (TYLENOL) tablet 650 mg (has no administration in time range)    Or  acetaminophen (TYLENOL) suppository 650 mg (has no administration in time range)  traZODone (DESYREL) tablet 25 mg (has no administration in time range)  magnesium hydroxide (MILK OF MAGNESIA) suspension 30 mL (has no administration in time range)  ondansetron (ZOFRAN) tablet 4 mg ( Oral See Alternative 04/06/23 2235)    Or  ondansetron (ZOFRAN) injection 4 mg (4 mg Intravenous Given 04/06/23 2235)  morphine (PF) 2 MG/ML injection 2 mg (2 mg Intravenous Given 04/06/23 2347)  metoCLOPramide (REGLAN) injection 10 mg (10 mg Intravenous Given 04/06/23 2347)  pantoprazole (PROTONIX) injection 40 mg (40 mg Intravenous Given 04/06/23 2347)  Oral care mouth rinse (has no administration in time range)  sodium chloride 0.9 % bolus 1,000 mL (0 mLs Intravenous Stopped 04/06/23 1907)  ondansetron (ZOFRAN) injection 4 mg (4 mg Intravenous Given 04/06/23 1716)  sodium chloride 0.9 % bolus 1,000 mL (1,000 mLs Intravenous New Bag/Given 04/06/23 1910)  promethazine (PHENERGAN) 12.5 mg in sodium chloride 0.9 % 50 mL IVPB (0 mg Intravenous Stopped 04/06/23 2048)     IMPRESSION / MDM / ASSESSMENT AND PLAN / ED COURSE  I reviewed the triage vital signs and the nursing notes.  Differential diagnosis includes, but is not limited to, DKA, HHS, viral illness viral illness, lower suspicion  acute intra-abdominal process given reassuring abdominal exam, no focal neurologic deficits to suggest acute intracranial process  Patient's presentation is most consistent with acute presentation with potential threat to life or bodily function.  32 year old female presenting with nausea and vomiting in the setting of insulin nonadherence.  Dry heaving here after receiving Zofran with EMS.  Ordered for Phenergan.  Labs from triage did result with significant hyperglycemia with glucose of 592, bicarb of 12, anion gap of 26, consistent with DKA.  VBG obtained with pH of 7.17.  Does additionally have significant AKI with a creatinine of 2.8.  Elevated beta hydroxybutyrate.  Patient was ordered for IV fluids as well as an insulin drip.  Did discuss admission and patient is agreeable.  Will reach out to hospitalist team.  Case reviewed with hospitalist team.  They will evaluate the patient for anticipated admission.      FINAL CLINICAL IMPRESSION(S) / ED DIAGNOSES   Final diagnoses:  Diabetic ketoacidosis without coma associated with type 1 diabetes mellitus (HCC)     Rx / DC Orders   ED Discharge Orders     None        Note:  This document was prepared using Dragon voice recognition software and may include unintentional dictation errors.   Trinna Post, MD 04/07/23 [phone]

## 2023-04-06 NOTE — ED Notes (Signed)
Unable to obtain VBG. Dr. Arnoldo Morale aware

## 2023-04-06 NOTE — ED Notes (Signed)
Nausea persists.  AAOx3.  Continue to monitor.

## 2023-04-06 NOTE — ED Notes (Signed)
Up triaged patietn to [facility] 2 due to bloodwork.

## 2023-04-06 NOTE — H&P (Signed)
[place]   PATIENT NAME: Ashley Camacho    MR#:  [id number]  DATE OF BIRTH:  Apr 04, 1991  DATE OF ADMISSION:  04/06/2023  PRIMARY CARE PHYSICIAN: Pcp, No   Patient is coming from: Home  REQUESTING/REFERRING PHYSICIAN: Trinna Post, MD  CHIEF COMPLAINT:   Chief Complaint  Patient presents with   Nausea    HISTORY OF PRESENT ILLNESS:  Ashley Camacho is a 32 y.o. Caucasian female with medical history significant for type 1 diabetes mellitus who presented to the ER with acute onset of recurrent nausea and vomiting over the last couple weeks with associated diarrhea with loose bowel movements.  She denies any bilious vomitus or hematemesis.  No melena or bright red bleeding per rectum.  No fever or chills.  She denied any abdominal pain.  No chest pain or palpitations.  No cough or wheezing or dyspnea.  She has been missing her insulin intermittently.  She denies any polyuria or polydipsia.   ED Course: Upon presentation to the emergency room, heart rate was 103 with otherwise normal vital signs.  Labs revealed a BUN of 49 and creatinine 2.86 with CO2 of 12 and anion gap of 6 with blood glucose of 592 and sodium 131, chloride 93 and potassium 5.8.  Total bili was 1.7.  CBC showed anemia close to previous levels. EKG as reviewed by me : EKG showed normal sinus rhythm with rate of 96 with T wave inversion anterolaterally and inferiorly. Imaging: None.  The patient was given 4 mg of IV Zofran and 12.5 mg of IV Phenergan as well as 1 L bolus of IV normal saline.  She will be admitted to a stepdown unit bed for further evaluation and management. PAST MEDICAL HISTORY:   Past Medical History:  Diagnosis Date   Acute metabolic encephalopathy 06/10/2022   Adrenal insufficiency (HCC)    Anemia    CHF (congestive heart failure) (HCC)    Gastroparesis    Hypertension    Intractable nausea and vomiting 11/13/2022   Type 1 diabetes (HCC)     PAST SURGICAL HISTORY:   Past Surgical  History:  Procedure Laterality Date   BIOPSY  01/14/2021   Procedure: BIOPSY;  Surgeon: Tressia Danas, MD;  Location: [facility] ENDOSCOPY;  Service: Gastroenterology;;   CESAREAN SECTION     CESAREAN SECTION WITH BILATERAL TUBAL LIGATION N/A 02/17/2021   Procedure: CESAREAN SECTION WITH BILATERAL TUBAL LIGATION;  Surgeon: [name], DO;  Location: [facility] LD ORS;  Service: Obstetrics;  Laterality: N/A;   ESOPHAGOGASTRODUODENOSCOPY (EGD) WITH PROPOFOL N/A 01/14/2021   Procedure: ESOPHAGOGASTRODUODENOSCOPY (EGD) WITH PROPOFOL;  Surgeon: Tressia Danas, MD;  Location[facility] ENDOSCOPY;  Service: Gastroenterology;  Laterality: N/A;   IR BONE MARROW BIOPSY & ASPIRATION  12/16/2022   IR REPLC DUODEN/JEJUNO TUBE PERCUT W/FLUORO  11/22/2022   JEJUNOSTOMY N/A 06/13/2022   Procedure: JEJUNOSTOMY;  Surgeon: Leafy Ro, MD;  Location: [facility] ORS;  Service: General;  Laterality: N/A;   MOUTH SURGERY      SOCIAL HISTORY:   Social History   Tobacco Use   Smoking status: Never    Passive exposure: Never   Smokeless tobacco: Never  Substance Use Topics   Alcohol use: No    FAMILY HISTORY:   Family History  Problem Relation Age of Onset   Breast cancer Mother    Seizures Mother    Diabetes type I Father    CAD Father    Lung cancer Maternal Grandfather  CAD Paternal Grandmother    CAD Paternal Grandfather    Ovarian cancer Neg Hx     DRUG ALLERGIES:  No Known Allergies  REVIEW OF SYSTEMS:   ROS As per history of present illness. All pertinent systems were reviewed above. Constitutional, HEENT, cardiovascular, respiratory, GI, GU, musculoskeletal, neuro, psychiatric, endocrine, integumentary and hematologic systems were reviewed and are otherwise negative/unremarkable except for positive findings mentioned above in the HPI.   MEDICATIONS AT HOME:   Prior to Admission medications   Medication Sig Start Date End Date Taking? Authorizing Provider  acetaminophen (TYLENOL) 500 MG tablet  Take 2 tablets (1,000 mg total) by mouth every 6 (six) hours as needed for headache, fever or moderate pain. 08/05/22   Loyce Dys, MD  divalproex (DEPAKOTE ER) 250 MG 24 hr tablet Take 1 tablet (250 mg total) by mouth daily. 08/05/22   Loyce Dys, MD  FLUoxetine (PROZAC) 10 MG capsule Take 1 capsule (10 mg total) by mouth daily. 08/05/22 12/30/22  Loyce Dys, MD  hydrocortisone (CORTEF) 10 MG tablet Take 2 tablets (20 mg total) by mouth daily with breakfast AND 1 tablet (10 mg total) daily with supper. 08/05/22   Loyce Dys, MD  insulin aspart (NOVOLOG) 100 UNIT/ML injection Inject 2 Units into the skin 3 (three) times daily with meals. 08/05/22 02/15/23  Loyce Dys, MD  insulin aspart (NOVOLOG) 100 UNIT/ML injection Inject 0-6 Units into the skin 4 (four) times daily - after meals and at bedtime. 11/15/22   Leeroy Bock, MD  Insulin [facility] KWIKPEN) 100 UNIT/ML Inject 7 Units into the skin daily. 03/26/23 09/30/23  Marrion Coy, MD  levothyroxine (SYNTHROID) 75 MCG tablet Take 1 tablet (75 mcg total) by mouth daily at 6 (six) AM. 03/27/23   Marrion Coy, MD  lipase/protease/amylase 24000-76000 units CPEP Take 1 capsule (24,000 Units total) by mouth 3 (three) times daily before meals. 03/26/23   Marrion Coy, MD  loperamide (IMODIUM) 2 MG capsule Take 2 capsules (4 mg total) by mouth as needed for diarrhea or loose stools. 11/15/22   Leeroy Bock, MD  Nutritional Supplements (FEEDING SUPPLEMENT, GLUCERNA 1.5 CAL,) LIQD Place 640 mLs into feeding tube at bedtime. 11/15/22   Leeroy Bock, MD  promethazine (PHENERGAN) 25 MG tablet Take 0.5 tablets (12.5 mg total) by mouth every 6 (six) hours as needed for up to 10 days for nausea or vomiting. 02/18/23 03/23/23  Loyce Dys, MD      VITAL SIGNS:  Blood pressure 127/73, pulse 90, temperature 98.4 F (36.9 C), temperature source Oral, resp. rate 17, height 5\' 1"  (1.549 m), weight 42.4 kg, last menstrual period  02/18/2023, SpO2 100%.  PHYSICAL EXAMINATION:  Physical Exam  GENERAL: Acutely ill 32 y.o.-year-old Caucasian female patient lying in the bed with no acute distress.  She was somnolent but arousable. EYES: Pupils equal, round, reactive to light and accommodation. No scleral icterus. Extraocular muscles intact.  HEENT: Head atraumatic, normocephalic. Oropharynx with dry mucous membrane and tongue and nasopharynx clear.  NECK:  Supple, no jugular venous distention. No thyroid enlargement, no tenderness.  LUNGS: Normal breath sounds bilaterally, no wheezing, rales,rhonchi or crepitation. No use of accessory muscles of respiration.  CARDIOVASCULAR: Regular rate and rhythm, S1, S2 normal. No murmurs, rubs, or gallops.  ABDOMEN: Soft, nondistended, nontender. Bowel sounds present. No organomegaly or mass.  EXTREMITIES: No pedal edema, cyanosis, or clubbing.  NEUROLOGIC: Cranial nerves II through XII are intact. Muscle strength 5/5 in  all extremities. Sensation intact. Gait not checked.  PSYCHIATRIC: The patient is somnolent but arousable and oriented x 3.  Normal affect and good eye contact. SKIN: No obvious rash, lesion, or ulcer.   LABORATORY PANEL:   CBC Recent Labs  Lab 04/06/23 1645  WBC 7.3  HGB 10.1*  HCT 32.8*  PLT 211   ------------------------------------------------------------------------------------------------------------------  Chemistries  Recent Labs  Lab 04/06/23 1645 04/06/23 2324 04/07/23 0200  NA 131*   < > 142  K 5.8*   < > 4.4  CL 93*   < > 111  CO2 12*   < > 21*  GLUCOSE 592*   < > 185*  BUN 49*   < > 41*  CREATININE 2.86*   < > 2.24*  CALCIUM 8.7*   < > 8.2*  AST 14*  --   --   ALT 10  --   --   ALKPHOS 87  --   --   BILITOT 1.7*  --   --    < > = values in this interval not displayed.   ------------------------------------------------------------------------------------------------------------------  Cardiac Enzymes No results for input(s):  "TROPONINI" in the last 168 hours. ------------------------------------------------------------------------------------------------------------------  RADIOLOGY:  No results found.    IMPRESSION AND PLAN:  Assessment and Plan: * DKA (diabetic ketoacidosis) (HCC) - This associated with uncontrolled type 1 diabetes mellitus. - The patient be admitted to stepdown unit bed. - We will continue her on IV insulin drip per DKA Endo tool protocol. - She be aggressively hydrated with IV lactated ringer. - We will follow serial BMPs.  Hypothyroidism - We will continue Synthroid.  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  Depression - We will continue Prozac.   DVT prophylaxis: Lovenox.  Advanced Care Planning:  Code Status: full code.  Family Communication:  The plan of care was discussed in details with the patient (and family). I answered all questions. The patient agreed to proceed with the above mentioned plan. Further management will depend upon hospital course. Disposition Plan: Back to previous home environment Consults called: none.  All the records are reviewed and case discussed with ED provider.  Status is: Inpatient    At the time of the admission, it appears that the appropriate admission status for this patient is inpatient.  This is judged to be reasonable and necessary in order to provide the required intensity of service to ensure the patient's safety given the presenting symptoms, physical exam findings and initial radiographic and laboratory data in the context of comorbid conditions.  The patient requires inpatient status due to high intensity of service, high risk of further deterioration and high frequency of surveillance required.  I certify that at the time of admission, it is my clinical judgment that the patient will require inpatient hospital care extending more than 2 midnights.                            Dispo: The patient is from: Home               Anticipated d/c is to: Home              Patient currently is not medically stable to d/c.              Difficult to place patient: No   Authorized and performed by: Valente David, MD Total critical care time:   50     minutes. Due to a high  probability of clinically significant, life-threatening deterioration, the patient required my highest level of preparedness to intervene emergently and I personally spent this critical care time directly and personally managing the patient.  This critical care time included obtaining a history, examining the patient, pulse oximetry, ordering and review of studies, arranging urgent treatment with development of management plan, evaluation of patient's response to treatment, frequent reassessment, and discussions with other providers. This critical care time was performed to assess and manage the high probability of imminent, life-threatening deterioration that could result in multiorgan failure.  It was exclusive of separately billable procedures and treating other patients and teaching time.   Hannah Beat M.D on 04/07/2023 at 4:56 AM  [facility]   From 7 PM-7 AM, contact night-coverage [email or web address]  CC: Primary care physician; Pcp, No

## 2023-04-06 NOTE — ED Triage Notes (Signed)
Pt reports feeling increasingly sick over the last two weeks. Pt reports poor compliance with her insulin during that time. Actively dry heaving during triage. Pt given IM zofran by EMS.

## 2023-04-06 NOTE — ED Notes (Signed)
BGL 464 INSULIN INCREASED TO 4.4

## 2023-04-06 NOTE — Plan of Care (Signed)

## 2023-04-07 ENCOUNTER — Inpatient Hospital Stay: Payer: Medicaid Other

## 2023-04-07 ENCOUNTER — Inpatient Hospital Stay: Payer: Medicaid Other | Admitting: Oncology

## 2023-04-07 DIAGNOSIS — Z934 Other artificial openings of gastrointestinal tract status: Secondary | ICD-10-CM

## 2023-04-07 DIAGNOSIS — E039 Hypothyroidism, unspecified: Secondary | ICD-10-CM

## 2023-04-07 DIAGNOSIS — E2740 Unspecified adrenocortical insufficiency: Secondary | ICD-10-CM | POA: Diagnosis not present

## 2023-04-07 DIAGNOSIS — N179 Acute kidney failure, unspecified: Secondary | ICD-10-CM

## 2023-04-07 DIAGNOSIS — E440 Moderate protein-calorie malnutrition: Secondary | ICD-10-CM | POA: Diagnosis not present

## 2023-04-07 DIAGNOSIS — E1010 Type 1 diabetes mellitus with ketoacidosis without coma: Secondary | ICD-10-CM | POA: Diagnosis not present

## 2023-04-07 DIAGNOSIS — R112 Nausea with vomiting, unspecified: Secondary | ICD-10-CM

## 2023-04-07 DIAGNOSIS — F32A Depression, unspecified: Secondary | ICD-10-CM | POA: Insufficient documentation

## 2023-04-07 LAB — CBC
HCT: 24.3 % — ABNORMAL LOW (ref 36.0–46.0)
Hemoglobin: 8 g/dL — ABNORMAL LOW (ref 12.0–15.0)
MCH: 29.2 pg (ref 26.0–34.0)
MCHC: 32.9 g/dL (ref 30.0–36.0)
MCV: 88.7 fL (ref 80.0–100.0)
Platelets: 163 10*3/uL (ref 150–400)
RBC: 2.74 MIL/uL — ABNORMAL LOW (ref 3.87–5.11)
RDW: 13.1 % (ref 11.5–15.5)
WBC: 7 10*3/uL (ref 4.0–10.5)
nRBC: 0 % (ref 0.0–0.2)

## 2023-04-07 LAB — BASIC METABOLIC PANEL
Anion gap: 17 — ABNORMAL HIGH (ref 5–15)
Anion gap: 10 (ref 5–15)
Anion gap: 7 (ref 5–15)
Anion gap: 7 (ref 5–15)
Anion gap: 8 (ref 5–15)
BUN: 44 mg/dL — ABNORMAL HIGH (ref 6–20)
BUN: 41 mg/dL — ABNORMAL HIGH (ref 6–20)
BUN: 39 mg/dL — ABNORMAL HIGH (ref 6–20)
BUN: 33 mg/dL — ABNORMAL HIGH (ref 6–20)
BUN: 29 mg/dL — ABNORMAL HIGH (ref 6–20)
CO2: 14 mmol/L — ABNORMAL LOW (ref 22–32)
CO2: 21 mmol/L — ABNORMAL LOW (ref 22–32)
CO2: 24 mmol/L (ref 22–32)
CO2: 24 mmol/L (ref 22–32)
CO2: 23 mmol/L (ref 22–32)
Calcium: 8.2 mg/dL — ABNORMAL LOW (ref 8.9–10.3)
Calcium: 8.2 mg/dL — ABNORMAL LOW (ref 8.9–10.3)
Calcium: 8.2 mg/dL — ABNORMAL LOW (ref 8.9–10.3)
Calcium: 8.2 mg/dL — ABNORMAL LOW (ref 8.9–10.3)
Calcium: 8.5 mg/dL — ABNORMAL LOW (ref 8.9–10.3)
Chloride: 108 mmol/L (ref 98–111)
Chloride: 111 mmol/L (ref 98–111)
Chloride: 111 mmol/L (ref 98–111)
Chloride: 112 mmol/L — ABNORMAL HIGH (ref 98–111)
Chloride: 112 mmol/L — ABNORMAL HIGH (ref 98–111)
Creatinine, Ser: 2.45 mg/dL — ABNORMAL HIGH (ref 0.44–1.00)
Creatinine, Ser: 2.24 mg/dL — ABNORMAL HIGH (ref 0.44–1.00)
Creatinine, Ser: 2.05 mg/dL — ABNORMAL HIGH (ref 0.44–1.00)
Creatinine, Ser: 1.77 mg/dL — ABNORMAL HIGH (ref 0.44–1.00)
Creatinine, Ser: 1.57 mg/dL — ABNORMAL HIGH (ref 0.44–1.00)
GFR, Estimated: 26 mL/min — ABNORMAL LOW (ref >60)
GFR, Estimated: 29 mL/min — ABNORMAL LOW (ref >60)
GFR, Estimated: 33 mL/min — ABNORMAL LOW (ref >60)
GFR, Estimated: 39 mL/min — ABNORMAL LOW (ref >60)
GFR, Estimated: 45 mL/min — ABNORMAL LOW (ref >60)
Glucose, Bld: 361 mg/dL — ABNORMAL HIGH (ref 70–99)
Glucose, Bld: 185 mg/dL — ABNORMAL HIGH (ref 70–99)
Glucose, Bld: 139 mg/dL — ABNORMAL HIGH (ref 70–99)
Glucose, Bld: 119 mg/dL — ABNORMAL HIGH (ref 70–99)
Glucose, Bld: 91 mg/dL (ref 70–99)
Potassium: 4.2 mmol/L (ref 3.5–5.1)
Potassium: 4.4 mmol/L (ref 3.5–5.1)
Potassium: 4.6 mmol/L (ref 3.5–5.1)
Potassium: 4.3 mmol/L (ref 3.5–5.1)
Potassium: 4.1 mmol/L (ref 3.5–5.1)
Sodium: 139 mmol/L (ref 135–145)
Sodium: 142 mmol/L (ref 135–145)
Sodium: 142 mmol/L (ref 135–145)
Sodium: 143 mmol/L (ref 135–145)
Sodium: 143 mmol/L (ref 135–145)

## 2023-04-07 LAB — GLUCOSE, CAPILLARY
Glucose-Capillary: 104 mg/dL — ABNORMAL HIGH (ref 70–99)
Glucose-Capillary: 106 mg/dL — ABNORMAL HIGH (ref 70–99)
Glucose-Capillary: 107 mg/dL — ABNORMAL HIGH (ref 70–99)
Glucose-Capillary: 109 mg/dL — ABNORMAL HIGH (ref 70–99)
Glucose-Capillary: 115 mg/dL — ABNORMAL HIGH (ref 70–99)
Glucose-Capillary: 115 mg/dL — ABNORMAL HIGH (ref 70–99)
Glucose-Capillary: 116 mg/dL — ABNORMAL HIGH (ref 70–99)
Glucose-Capillary: 123 mg/dL — ABNORMAL HIGH (ref 70–99)
Glucose-Capillary: 124 mg/dL — ABNORMAL HIGH (ref 70–99)
Glucose-Capillary: 128 mg/dL — ABNORMAL HIGH (ref 70–99)
Glucose-Capillary: 143 mg/dL — ABNORMAL HIGH (ref 70–99)
Glucose-Capillary: 181 mg/dL — ABNORMAL HIGH (ref 70–99)
Glucose-Capillary: 251 mg/dL — ABNORMAL HIGH (ref 70–99)
Glucose-Capillary: 262 mg/dL — ABNORMAL HIGH (ref 70–99)
Glucose-Capillary: 300 mg/dL — ABNORMAL HIGH (ref 70–99)
Glucose-Capillary: 94 mg/dL (ref 70–99)

## 2023-04-07 LAB — MRSA NEXT GEN BY PCR, NASAL: MRSA by PCR Next Gen: NOT DETECTED

## 2023-04-07 LAB — HCG, QUANTITATIVE, PREGNANCY: hCG, Beta Chain, Quant, S: 2 m[IU]/mL (ref <5)

## 2023-04-07 LAB — BETA-HYDROXYBUTYRIC ACID
Beta-Hydroxybutyric Acid: 5.65 mmol/L — ABNORMAL HIGH (ref 0.05–0.27)
Beta-Hydroxybutyric Acid: 0.65 mmol/L — ABNORMAL HIGH (ref 0.05–0.27)
Beta-Hydroxybutyric Acid: 1.2 mmol/L — ABNORMAL HIGH (ref 0.05–0.27)

## 2023-04-07 MED ORDER — HYDROCORTISONE 10 MG PO TABS
10.0000 mg | ORAL_TABLET | Freq: Every day | ORAL | Status: DC
  Administered 2023-04-07 – 2023-04-09 (×3): 10 mg
  Filled 2023-04-07 (×4): qty 1

## 2023-04-07 MED ORDER — ACETAMINOPHEN 325 MG PO TABS
650.0000 mg | ORAL_TABLET | Freq: Four times a day (QID) | ORAL | Status: DC | PRN
Start: 2023-04-07 — End: 2023-04-10
  Administered 2023-04-07 – 2023-04-09 (×2): 650 mg
  Filled 2023-04-07 (×2): qty 2

## 2023-04-07 MED ORDER — GLUCERNA 1.5 CAL PO LIQD
1000.0000 mL | ORAL | Status: DC
  Administered 2023-04-07 – 2023-04-10 (×3): 1000 mL

## 2023-04-07 MED ORDER — INSULIN GLARGINE-YFGN 100 UNIT/ML ~~LOC~~ SOLN
5.0000 [IU] | Freq: Every day | SUBCUTANEOUS | Status: DC
  Administered 2023-04-07 – 2023-04-08 (×2): 5 [IU] via SUBCUTANEOUS
  Filled 2023-04-07 (×2): qty 0.05

## 2023-04-07 MED ORDER — ACETAMINOPHEN 650 MG RE SUPP
650.0000 mg | Freq: Four times a day (QID) | RECTAL | Status: DC | PRN
Start: 2023-04-07 — End: 2023-04-10

## 2023-04-07 MED ORDER — OSMOLITE 1.2 CAL PO LIQD: 1000.0000 mL | ORAL | Status: DC

## 2023-04-07 MED ORDER — ORAL CARE MOUTH RINSE: 15.0000 mL | OROMUCOSAL | Status: DC | PRN

## 2023-04-07 MED ORDER — TRAZODONE HCL 50 MG PO TABS: 25.0000 mg | ORAL_TABLET | Freq: Every evening | ORAL | Status: DC | PRN

## 2023-04-07 MED ORDER — SODIUM CHLORIDE 0.9 % IV SOLN
12.5000 mg | Freq: Four times a day (QID) | INTRAVENOUS | Status: DC | PRN
  Administered 2023-04-07 – 2023-04-09 (×4): 12.5 mg via INTRAVENOUS
  Filled 2023-04-07: qty 0.5
  Filled 2023-04-07 (×2): qty 12.5
  Filled 2023-04-07: qty 0.5

## 2023-04-07 MED ORDER — GLUCERNA 1.5 CAL PO LIQD: 700.0000 mL | ORAL | Status: DC

## 2023-04-07 MED ORDER — HYDROCORTISONE 10 MG PO TABS
20.0000 mg | ORAL_TABLET | Freq: Every day | ORAL | Status: DC
  Administered 2023-04-08 – 2023-04-10 (×3): 20 mg
  Filled 2023-04-07 (×3): qty 2

## 2023-04-07 MED ORDER — FLUOXETINE HCL 10 MG PO CAPS
10.0000 mg | ORAL_CAPSULE | Freq: Every day | ORAL | Status: DC
  Administered 2023-04-08 – 2023-04-10 (×3): 10 mg
  Filled 2023-04-07 (×4): qty 1

## 2023-04-07 MED ORDER — INSULIN ASPART 100 UNIT/ML IJ SOLN
0.0000 [IU] | INTRAMUSCULAR | Status: DC
  Administered 2023-04-07 (×2): 8 [IU] via SUBCUTANEOUS
  Administered 2023-04-08: 3 [IU] via SUBCUTANEOUS
  Filled 2023-04-07 (×3): qty 1

## 2023-04-07 MED ORDER — LEVOTHYROXINE SODIUM 50 MCG PO TABS
75.0000 ug | ORAL_TABLET | Freq: Every day | ORAL | Status: DC
  Administered 2023-04-08 – 2023-04-10 (×3): 75 ug
  Filled 2023-04-07: qty 2
  Filled 2023-04-07 (×2): qty 1

## 2023-04-07 MED ORDER — FREE WATER
50.0000 mL | Status: DC
  Administered 2023-04-07 – 2023-04-10 (×18): 50 mL

## 2023-04-07 MED ORDER — CHLORHEXIDINE GLUCONATE CLOTH 2 % EX PADS
6.0000 | MEDICATED_PAD | Freq: Every day | CUTANEOUS | Status: DC
  Administered 2023-04-07 – 2023-04-10 (×3): 6 via TOPICAL

## 2023-04-07 NOTE — Progress Notes (Signed)
Progress Note   Patient: Ashley Camacho:[id number] DOB: 02/17/1991 DOA: 04/06/2023     1 DOS: the patient was seen and examined on 04/07/2023   Brief hospital course: Ashley Camacho is a 32 y.o. Caucasian female with medical history significant for type 1 diabetes mellitus who presented to the ER with acute onset of recurrent nausea and vomiting over the last couple weeks with associated diarrhea with loose bowel movements.  She denies any bilious vomitus or hematemesis. She denied any abdominal pain. She has been missing her insulin intermittently.  Patient is admitted to the hospitalist service with impression of DKA, intractable nausea and vomiting.  Assessment and Plan: * DKA (diabetic ketoacidosis) (HCC) Associated with uncontrolled type 1 diabetes mellitus. Patient will be continued to to be monitored in stepdown unit. Continue IV insulin drip per DKA Endo tool protocol. Continue IV fluids per DKA protocol. Once her anion gap is closed, blood sugars better start her on tube feeds as she is unable to tolerate p.o. Will start her on long-acting insulin, subcu sliding scale and taper down IV insulin, iv fluids per protocol. Oral meds to be changed thru PEG by pharmacy.  Intractable nausea and vomiting. Gastroparesis Patient will be continued on Reglan therapy. Continue IV Zofran as needed. IV PPI. Gentle IV hydration as per DKA protocol.  AKI: Prerenal in the setting of poor oral intake, severe nausea vomiting. Continue aggressive IV fluids. Avoid nephrotoxic drugs. Monitor daily renal function, electrolytes.  Hypothyroidism Continue home dose Synthroid.  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  Nutrition Documentation    Flowsheet Row ED to Hosp-Admission (Current) from 04/06/2023 in [facility]/CCU  Nutrition Problem Moderate Malnutrition  Etiology chronic illness   [gastroparesis]  Nutrition Goal Patient will meet greater than or equal to 90% of their needs  Interventions Tube feeding     Out of bed to chair. Incentive spirometry. Nursing supportive care. Fall, aspiration precautions. DVT prophylaxis   Code Status: Full Code  Subjective: Patient is seen and examined today morning.  She is still continues to have nausea, no vomiting.  Patient does have sore throat, dysphagia.  Attributes his symptoms to gastroparesis.  Patient does have PEG tube and usually uses 4 night feeding.  Physical Exam: Vitals:   04/07/23 0800 04/07/23 0804 04/07/23 0900 04/07/23 1000  BP: 133/75  124/69 129/74  Pulse: 94 92 97 89  Resp: 18 (!) 21 (!) 25 20  Temp:      TempSrc:      SpO2: 100% 100% 100% 99%  Weight:      Height:        General - Young thin built Caucasian female, distress due to nausea HEENT - PERRLA, EOMI, atraumatic head, non tender sinuses. Lung - Clear, no rales, rhonchi, wheezes. Heart - S1, S2 heard, no murmurs, rubs, no pedal edema. Abdomen - Soft, non tender, bowel sounds good Neuro - Alert, awake and oriented x 3, non focal exam. Skin - Warm and dry.  Data Reviewed:      Latest Ref Rng & Units 04/07/2023    4:42 AM 04/06/2023    4:45 PM 03/25/2023    7:09 AM  CBC  WBC 4.0 - 10.5 K/uL 7.0  7.3  4.4   Hemoglobin 12.0 - 15.0 g/dL 8.0  25.3  66.4   Hematocrit 36.0 - 46.0 % 24.3  32.8  29.2   Platelets 150 - 400 K/uL 163  211  112       Latest Ref Rng & Units 04/07/2023    9:52 AM 04/07/2023    4:42 AM 04/07/2023    2:00 AM  BMP  Glucose 70 - 99 mg/dL 604  540  981   BUN 6 - 20 mg/dL 33  39  41   Creatinine 0.44 - 1.00 mg/dL 1.91  4.78  2.95   Sodium 135 - 145 mmol/L 143  142  142   Potassium 3.5 - 5.1 mmol/L 4.3  4.6  4.4   Chloride 98 - 111 mmol/L 112  111  111   CO2 22 - 32 mmol/L 24  24  21    Calcium 8.9 - 10.3 mg/dL 8.2  8.2  8.2    No results found.   Family Communication: Discussed with patient, she understand  and agree. All questions answereed.  Disposition: Status is: Inpatient Remains inpatient appropriate because:  DKA protocol.  Planned Discharge Destination: Home     MDM level 3- Patient admitted with DKA, intractable nausea and vomiting. She is on insulin drip, IV fluids with plan to transition to Lantus with tube feeds. She is at high risk for electrolyte abnormalities, sudden clinical deterioration.  Author: Marcelino Duster, MD 04/07/2023 1:22 PM Secure chat 7am to 7pm For on call review [email or web address].

## 2023-04-07 NOTE — Progress Notes (Signed)
       CROSS COVER NOTE  NAME: Ashley Camacho MRN: [id number] DOB : [birth year]    Concern as stated by nurse / staff     Patient was transitioned of endotool after long acting insulin given without ongoing SSI   Pertinent findings on chart review: 5 units semglee ordered and given 1024  Confirmed patient is receiving her continuous tubefeeking  Assessment and  Interventions   Assessment:  Plan: Moderate dose SSI .  Hypoglycemic event appox 3 am - recovered with D50 2.  Cbx bmp. Mag, and  for am labs        Donnie Mesa NP [facility] Cross Cover 7pm-7am - check amion for availability Pager [phone]

## 2023-04-07 NOTE — Assessment & Plan Note (Signed)
-   We will continue Synthroid. 

## 2023-04-07 NOTE — Progress Notes (Signed)
Spoke with Dietary regarding patients tube feeds Glucerna- States it is on its way.

## 2023-04-07 NOTE — Assessment & Plan Note (Signed)
-   This associated with uncontrolled type 1 diabetes mellitus. - The patient be admitted to stepdown unit bed. - We will continue her on IV insulin drip per DKA Endo tool protocol. - She be aggressively hydrated with IV lactated ringer. - We will follow serial BMPs.

## 2023-04-07 NOTE — TOC Initial Note (Signed)
Transition of Care [facility]) - Initial/Assessment Note    Patient Details  Name: Ashley Camacho MRN: [id number] Date of Birth: [birth year]  Transition of Care [facility]) CM/SW Contact:    Margarito Liner, LCSW Phone Number: 04/07/2023, 11:09 AM  Clinical Narrative: Readmission prevention screen complete. CSW met with patient. No supports at bedside. CSW introduced role and explained that discharge planning would be discussed. Patient's new PCP will be at [facility]. Her first appointment is in December. Her mother will transport her to appointments. [facility] [address]. No issues obtaining medications. Patient lives at home with her two daughters and their father. No home health or DME use prior to admission. No further concerns. CSW encouraged patient to contact CSW as needed. CSW will continue to follow patient for support and facilitate return home once stable. Her mom will transport her home at discharge.                 Expected Discharge Plan: Home/Self Care Barriers to Discharge: Continued Medical Work up   Patient Goals and CMS Choice            Expected Discharge Plan and Services     Post Acute Care Choice: NA Living arrangements for the past 2 months: Single Family Home                                      Prior Living Arrangements/Services Living arrangements for the past 2 months: Single Family Home Lives with:: Minor Children, Friends Patient language and need for interpreter reviewed:: Yes Do you feel safe going back to the place where you live?: Yes      Need for Family Participation in Patient Care: Yes (Comment) Care giver support system in place?: Yes (comment)   Criminal Activity/Legal Involvement Pertinent to Current Situation/Hospitalization: No - Comment as needed  Activities of Daily Living   ADL Screening (condition at time of admission) Independently performs ADLs?: Yes (appropriate for developmental age) Is  the patient deaf or have difficulty hearing?: No Does the patient have difficulty seeing, even when wearing glasses/contacts?: No Does the patient have difficulty concentrating, remembering, or making decisions?: No  Permission Sought/Granted                  Emotional Assessment Appearance:: Appears stated age Attitude/Demeanor/Rapport: Engaged, Gracious Affect (typically observed): Accepting, Appropriate, Calm, Pleasant Orientation: : Oriented to Self, Oriented to Place, Oriented to  Time, Oriented to Situation Alcohol / Substance Use: Not Applicable Psych Involvement: No (comment)  Admission diagnosis:  DKA (diabetic ketoacidosis) (HCC) [E11.10] Diabetic ketoacidosis without coma associated with type 1 diabetes mellitus (HCC) [E10.10] Patient Active Problem List   Diagnosis Date Noted   Depression 04/07/2023   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   Malnutrition of moderate degree 03/24/2023   Type 1 diabetes mellitus with hyperosmolar hyperglycemic state (HHS) (HCC) 03/23/2023   Diabetes mellitus type 1 with hyperosmolarity (HCC) 03/23/2023   Anemia    Anemia in chronic kidney disease (CKD) 12/30/2022   AKI (acute kidney injury) (HCC) 11/22/2022   Dislodged jejunostomy tube 11/22/2022   Type 1 diabetes mellitus without complications (HCC) 11/22/2022   DVT (deep venous thrombosis) (HCC) 11/20/2022   Failure to thrive in adult 11/13/2022   Hyperglycemia 11/13/2022   Normocytic anemia 11/12/2022   Severe recurrent major depression (HCC) 11/03/2022   CKD stage 3a, GFR 45-59 ml/min (HCC) 11/01/2022  Elevated serum creatinine 11/01/2022   Abdominal pain 11/01/2022   Cystitis 11/01/2022   Acute exacerbation of congestive heart failure (HCC) 08/23/2022   Diabetic ketoacidosis (HCC) 08/23/2022   Hypothermia 07/27/2022   Stage 3b chronic kidney disease (HCC) 06/29/2022   Hypovolemic shock (HCC) 06/28/2022   Jejunostomy tube present (HCC) 06/28/2022   Insomnia 06/20/2022    Hypophosphatemia 06/13/2022   Severe malnutrition (HCC) 06/13/2022   Hypotension 06/10/2022   Pseudohyponatremia 06/10/2022   Hypomagnesemia 06/10/2022   Adrenal insufficiency (HCC) 06/09/2022   Acute gastroenteritis 02/28/2022   Hypokalemia 02/28/2022   Hypocalcemia 02/28/2022   Metabolic acidosis 02/28/2022   Essential hypertension 02/28/2022   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   Nausea & vomiting 02/28/2022   High anion gap metabolic acidosis 01/02/2022   UTI (urinary tract infection) 11/03/2021   Hypoglycemia due to type 1 diabetes mellitus (HCC) 11/02/2021   Thrombocytopenia (HCC) 11/02/2021   Problem related to social environment 09/24/2021   Rhinovirus 09/22/2021   Acute on chronic HFrEF (heart failure with reduced ejection fraction) (HCC) 09/07/2021   Pneumonia due to human metapneumovirus 08/31/2021   Gastro-esophageal reflux disease with esophagitis 08/31/2021   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   Congestive heart failure (HCC) 08/31/2021   Hypoglycemia due to insulin 06/14/2021   DKA (diabetic ketoacidosis) (HCC) 06/14/2021   CKD stage 3 due to type 1 diabetes mellitus (HCC) 06/14/2021   Transaminitis 06/14/2021   Hypoglycemia associated with diabetes (HCC) 06/03/2021   DKA, type 1 (HCC) 05/20/2021   Major depressive disorder, recurrent episode, moderate (HCC) 05/08/2021   Right arm pain 05/07/2021   Rash of hand 05/07/2021   Hyperkalemia 05/06/2021   History of adrenal insufficiency 05/06/2021   CAP (community acquired pneumonia) 05/05/2021   Hypoglycemia 04/27/2021   Combined systolic and diastolic heart failure (HCC) 03/11/2021   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) 03/11/2021   Protein-calorie malnutrition, severe (HCC) 03/11/2021   Adrenal insufficiency (Addison's disease) (HCC) 03/10/2021   Postoperative hematoma involving genitourinary system following genitourinary procedure    S/P cesarean section  02/17/2021   Acute esophagitis    Leg edema    History of preterm delivery, currently pregnant    Pre-existing type 1 diabetes mellitus during pregnancy in third trimester    Anemia of chronic disease    Type 1 diabetes mellitus affecting pregnancy in second trimester, antepartum    HFrEF (heart failure with reduced ejection fraction) (HCC)    Acute deep vein thrombosis (DVT) of brachial vein of right upper extremity (HCC) 12/17/2020   Anxiety 12/17/2020   Diabetic retinopathy (HCC) 12/17/2020   Chronic combined systolic and diastolic CHF (congestive heart failure) (HCC)    Hyperemesis 12/13/2020   Type 1 diabetes mellitus with hypoglycemia without coma (HCC) 12/13/2020   Sunburn 12/13/2020   Hypothyroidism 12/10/2020   Malnutrition (HCC) 12/10/2020   Vitreous hemorrhage of right eye (HCC) 12/10/2020   Pleural effusion associated with pulmonary infection 11/25/2020   Pneumonia affecting pregnancy 11/24/2020   Diabetes mellitus affecting pregnancy, second trimester 11/24/2020   Edema 11/24/2020   Decreased urine output 11/24/2020   Shortness of breath    Zinc deficiency    SOB (shortness of breath)    Social problem 11/21/2020   Nausea and vomiting during pregnancy prior to [redacted] weeks gestation 11/21/2020   Low serum vitamin B12    Delivery with history of C-section 11/20/2020   Absolute anemia    Acute kidney injury superimposed on chronic kidney disease (HCC)    Nausea and  vomiting during pregnancy 10/23/2020   History of premature delivery, currently pregnant, second trimester 10/13/2020   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 [place] [address] Phone:  [phone] Fax: [phone]  [facility] [address] [place] [address] Phone: [phone] Fax: [phone]     Social Determinants of Health (SDOH) Social History: SDOH Screenings   Food Insecurity: No Food Insecurity (04/06/2023)  Housing: Low Risk  (04/06/2023)  Transportation Needs: No Transportation Needs (04/06/2023)  Utilities: Not At Risk (04/06/2023)  Depression (PHQ2-9): Low Risk  (11/11/2020)  Financial Resource Strain: Medium Risk (09/01/2021)   Received from [facility]  Physical Activity: Inactive (08/19/2021)   Received from [facility], [facility]  Social Connections: Unknown (08/19/2021)   Received from [facility]  Tobacco Use: Low Risk  (04/06/2023)   SDOH Interventions:     Readmission Risk Interventions    04/07/2023   11:07 AM 02/17/2023    3:02 PM 11/24/2022   11:04 AM  Readmission Risk Prevention Plan  Transportation Screening Complete Complete Complete  Medication Review Oceanographer) Complete Complete Complete  PCP or Specialist appointment within 3-5 days of discharge Complete Complete Complete  HRI or Home Care Consult  Not Complete   HRI or Home Care Consult Pt Refusal Comments  N/A   SW Recovery Care/Counseling Consult Complete Complete Complete  Palliative Care Screening Not Applicable Not Applicable Not Applicable  Skilled Nursing Facility Not Applicable Not Applicable Not Applicable

## 2023-04-07 NOTE — Inpatient Diabetes Management (Addendum)
Inpatient Diabetes Program Recommendations  AACE/ADA: New Consensus Statement on Inpatient Glycemic Control (2015)  Target Ranges:  Prepandial:   less than 140 mg/dL      Peak postprandial:   less than 180 mg/dL (1-2 hours)      Critically ill patients:  140 - 180 mg/dL    Latest Reference Range & Units 04/06/23 16:45  Glucose 70 - 99 mg/dL 034 (HH)  (HH): Data is critically high  Latest Reference Range & Units 04/06/23 16:45 04/06/23 23:24 04/07/23 04:42  Beta-Hydroxybutyric Acid 0.05 - 0.27 mmol/L >8.00 (H) 5.65 (H) 0.65 (H)    Latest Reference Range & Units 04/06/23 16:33 04/06/23 20:29 04/06/23 21:14 04/06/23 22:16 04/06/23 23:06 04/07/23 00:11 04/07/23 01:04 04/07/23 02:15 04/07/23 03:30  Glucose-Capillary 70 - 99 mg/dL 742 (HH) 595 (H)  IV Insulin Drip Started 464 (H) 377 (H) 333 (H) 251 (H) 181 (H) 143 (H) 128 (H)  (HH): Data is critically high (H): Data is abnormally high  Latest Reference Range & Units 04/07/23 04:31 04/07/23 05:31 04/07/23 06:53  Glucose-Capillary 70 - 99 mg/dL 638 (H) 756 (H) 433 (H)  IV Insulin Drip Running  (H): Data is abnormally high   Admit with: DKA  History: Type 1 Diabetes, Gastroparesis, Feeding Tube  Home DM Meds: Novolog 0-6 units QID      Novolog 2 units TID      Basaglar 7 units Daily  Current Orders: IV Insulin Drip     Pt is well known to the Inpatient Diabetes Team--Multiple past hospitalizations  Getting PO Cortef 20 mg AM/ 10 mg PM for Adrenal Insufficiency    MD- Note 4;42am BMET shows the Anion Gap is 7 and the CO2 level is 24  When you allow pt to transition to SQ Insulin, please consider:  1. Start Semglee 5 units Daily  Make sure to Continue IV Insulin Drip for 2 hours after Semglee given and then can d/c IV Insulin Drip  2. Start Novolog Sensitive Correction Scale/ SSI (0-9 units) Q4 hours     --Will follow patient during hospitalization--  Ambrose Finland RN, MSN, CDCES Diabetes  Coordinator Inpatient Glycemic Control Team Team Pager: [phone] (8a-5p)

## 2023-04-07 NOTE — Progress Notes (Addendum)
Initial Nutrition Assessment  DOCUMENTATION CODES:   Underweight, Non-severe (moderate) malnutrition in context of chronic illness  INTERVENTION:   TF via j-tube:   Glucerna 1.5 @ 50 ml/hr   Free water flushes 50ml q4 hours to maintain tube patency    Regimen provides 1800 kcal/day, 99 g/day protein and 911 ml/day of free water. Total free water: 1211 ml daily  NUTRITION DIAGNOSIS:   Moderate Malnutrition related to chronic illness (gastroparesis) as evidenced by mild fat depletion, mild muscle depletion, moderate muscle depletion.  GOAL:   Patient will meet greater than or equal to 90% of their needs  MONITOR:   PO intake, TF tolerance  REASON FOR ASSESSMENT:   Consult Assessment of nutrition requirement/status, Enteral/tube feeding initiation and management  ASSESSMENT:   Pt with medical history significant for type 1 diabetes mellitus who presented with acute onset of recurrent nausea and vomiting over the last couple weeks with associated diarrhea with loose bowel movements.  Pt admitted with DKA.   Reviewed I/O's: +231 ml x 24 hours  UOP: 1 L x 24 hours   Pt sleeping soundly at time of visit. She did not arouse to voice.   Pt very familiar to this RD due to multiple prior admissions. Pt with a complicated social situation, which is her largest barrier to care. She often has difficulty attending medical appointments due to lack of childcare. She also reports she has been recommended G-POEM procedure by GI, however, she has been unable to proceed as this procedure is not available locally and is unable to obtain transportation to appointment to [facility] or [place]. She has been unable to obtain endocrinology follow-up due to expired referral. Pt infuses TF intermittently at home due to her daughter's sleep schedule. Per TOC notes, PCP recently arranged with [facility]; pt with upcoming appointment.   Per previous admission, she reports being unable  to tolerate TF over 50 ml/hr.   Case discussed with MD; will do continuous feeds for now since pt is NPO and unable to tolerate PO at this time.   Reviewed wt hx; pt has experienced a 5.1% wt loss over the past 3 months, which is not significant for time frame.   Medications reviewed and include reglan, protonix, dextrose 5% in lactated ringers infusion @ 125 ml/hr, and lactated ringers infusion @ 125 ml/hr.   Lab Results  Component Value Date   HGBA1C 9.4 (H) 11/02/2022   PTA DM medications are 2 units insulin aspart TID, 0-6 units insulin aspart TID, and 7 units insulin glargine BID.   Labs reviewed: CBGS: 104-115 (inpatient orders for glycemic control are IV insulin drip and 5 units insulin glargine-yfgn daily).    NUTRITION - FOCUSED PHYSICAL EXAM:  Flowsheet Row Most Recent Value  Orbital Region Mild depletion  Upper Arm Region Mild depletion  Thoracic and Lumbar Region No depletion  Buccal Region No depletion  Temple Region Mild depletion  Clavicle Bone Region Moderate depletion  Clavicle and Acromion Bone Region Moderate depletion  Scapular Bone Region Moderate depletion  Dorsal Hand Mild depletion  Patellar Region Mild depletion  Anterior Thigh Region Mild depletion  Posterior Calf Region Mild depletion  Edema (RD Assessment) None  Hair Reviewed  Eyes Reviewed  Mouth Reviewed  Skin Reviewed  Nails Reviewed       Diet Order:   Diet Order             Diet NPO time specified Except for: Sips with Meds  Diet effective now  EDUCATION NEEDS:   No education needs have been identified at this time  Skin:  Skin Assessment: Reviewed RN Assessment  Last BM:  04/05/23  Height:   Ht Readings from Last 1 Encounters:  04/06/23 5\' 1"  (1.549 m)    Weight:   Wt Readings from Last 1 Encounters:  04/06/23 42.4 kg    Ideal Body Weight:  47.7 kg  BMI:  Body mass index is 17.66 kg/m.  Estimated Nutritional Needs:   Kcal:   1700-1900  Protein:  85-100 grams  Fluid:  > 1.7 L    Levada Schilling, RD, LDN, CDCES Registered Dietitian III Certified Diabetes Care and Education Specialist Please refer to [facility] for RD and/or RD on-call/weekend/after hours pager

## 2023-04-07 NOTE — Assessment & Plan Note (Signed)
-   We will continue Prozac. 

## 2023-04-07 NOTE — Plan of Care (Signed)

## 2023-04-07 NOTE — Plan of Care (Signed)
Patient alert and oriented. Patient transitioned off of insulin drip. No complaints of pain. Intermittently having N/V, Zofran given. Patient has been on room air. Patient unable to tolerate po. Tube feeds started. Patient up to bedside commode. Continue to assess.

## 2023-04-07 NOTE — Assessment & Plan Note (Signed)
-   We will continue Cortef. 

## 2023-04-08 DIAGNOSIS — E1010 Type 1 diabetes mellitus with ketoacidosis without coma: Secondary | ICD-10-CM | POA: Diagnosis not present

## 2023-04-08 DIAGNOSIS — R112 Nausea with vomiting, unspecified: Secondary | ICD-10-CM | POA: Diagnosis not present

## 2023-04-08 DIAGNOSIS — E440 Moderate protein-calorie malnutrition: Secondary | ICD-10-CM | POA: Diagnosis not present

## 2023-04-08 DIAGNOSIS — E039 Hypothyroidism, unspecified: Secondary | ICD-10-CM | POA: Diagnosis not present

## 2023-04-08 DIAGNOSIS — Z934 Other artificial openings of gastrointestinal tract status: Secondary | ICD-10-CM | POA: Diagnosis not present

## 2023-04-08 DIAGNOSIS — E2740 Unspecified adrenocortical insufficiency: Secondary | ICD-10-CM | POA: Diagnosis not present

## 2023-04-08 DIAGNOSIS — N179 Acute kidney failure, unspecified: Secondary | ICD-10-CM | POA: Diagnosis not present

## 2023-04-08 LAB — GLUCOSE, CAPILLARY
Glucose-Capillary: 181 mg/dL — ABNORMAL HIGH (ref 70–99)
Glucose-Capillary: 45 mg/dL — ABNORMAL LOW (ref 70–99)
Glucose-Capillary: 49 mg/dL — ABNORMAL LOW (ref 70–99)
Glucose-Capillary: 83 mg/dL (ref 70–99)
Glucose-Capillary: 241 mg/dL — ABNORMAL HIGH (ref 70–99)
Glucose-Capillary: 41 mg/dL — CL (ref 70–99)
Glucose-Capillary: 159 mg/dL — ABNORMAL HIGH (ref 70–99)
Glucose-Capillary: 64 mg/dL — ABNORMAL LOW (ref 70–99)
Glucose-Capillary: 222 mg/dL — ABNORMAL HIGH (ref 70–99)
Glucose-Capillary: 281 mg/dL — ABNORMAL HIGH (ref 70–99)
Glucose-Capillary: 293 mg/dL — ABNORMAL HIGH (ref 70–99)

## 2023-04-08 LAB — CBC WITH DIFFERENTIAL/PLATELET
Abs Immature Granulocytes: 0.02 10*3/uL (ref 0.00–0.07)
Basophils Absolute: 0 10*3/uL (ref 0.0–0.1)
Basophils Relative: 1 %
Eosinophils Absolute: 0.1 10*3/uL (ref 0.0–0.5)
Eosinophils Relative: 1 %
HCT: 26.7 % — ABNORMAL LOW (ref 36.0–46.0)
Hemoglobin: 8.7 g/dL — ABNORMAL LOW (ref 12.0–15.0)
Immature Granulocytes: 0 %
Lymphocytes Relative: 16 %
Lymphs Abs: 0.9 10*3/uL (ref 0.7–4.0)
MCH: 28.7 pg (ref 26.0–34.0)
MCHC: 32.6 g/dL (ref 30.0–36.0)
MCV: 88.1 fL (ref 80.0–100.0)
Monocytes Absolute: 0.4 10*3/uL (ref 0.1–1.0)
Monocytes Relative: 7 %
Neutro Abs: 4.1 10*3/uL (ref 1.7–7.7)
Neutrophils Relative %: 75 %
Platelets: 153 10*3/uL (ref 150–400)
RBC: 3.03 MIL/uL — ABNORMAL LOW (ref 3.87–5.11)
RDW: 13.2 % (ref 11.5–15.5)
WBC: 5.4 10*3/uL (ref 4.0–10.5)
nRBC: 0 % (ref 0.0–0.2)

## 2023-04-08 LAB — URINALYSIS, ROUTINE W REFLEX MICROSCOPIC
Bilirubin Urine: NEGATIVE
Glucose, UA: >=500 mg/dL — AB
Ketones, ur: 80 mg/dL — AB
Nitrite: NEGATIVE
Protein, ur: 30 mg/dL — AB
Specific Gravity, Urine: 1.014 (ref 1.005–1.030)
pH: 5 (ref 5.0–8.0)

## 2023-04-08 LAB — BASIC METABOLIC PANEL
Anion gap: 9 (ref 5–15)
BUN: 28 mg/dL — ABNORMAL HIGH (ref 6–20)
CO2: 24 mmol/L (ref 22–32)
Calcium: 8.7 mg/dL — ABNORMAL LOW (ref 8.9–10.3)
Chloride: 112 mmol/L — ABNORMAL HIGH (ref 98–111)
Creatinine, Ser: 1.43 mg/dL — ABNORMAL HIGH (ref 0.44–1.00)
GFR, Estimated: 50 mL/min — ABNORMAL LOW (ref >60)
Glucose, Bld: 88 mg/dL (ref 70–99)
Potassium: 4.3 mmol/L (ref 3.5–5.1)
Sodium: 145 mmol/L (ref 135–145)

## 2023-04-08 LAB — PHOSPHORUS: Phosphorus: 1.7 mg/dL — ABNORMAL LOW (ref 2.5–4.6)

## 2023-04-08 LAB — MAGNESIUM: Magnesium: 2.2 mg/dL (ref 1.7–2.4)

## 2023-04-08 MED ORDER — DEXTROSE 50 % IV SOLN
INTRAVENOUS | Status: AC
  Filled 2023-04-08: qty 50

## 2023-04-08 MED ORDER — ONDANSETRON HCL 4 MG/2ML IJ SOLN
4.0000 mg | Freq: Once | INTRAMUSCULAR | Status: AC
  Administered 2023-04-08: 4 mg via INTRAVENOUS
  Filled 2023-04-08: qty 2

## 2023-04-08 MED ORDER — INSULIN GLARGINE-YFGN 100 UNIT/ML ~~LOC~~ SOLN
7.0000 [IU] | Freq: Every day | SUBCUTANEOUS | Status: DC
Start: 2023-04-09 — End: 2023-04-10
  Administered 2023-04-09 – 2023-04-10 (×2): 7 [IU] via SUBCUTANEOUS
  Filled 2023-04-08 (×2): qty 0.07

## 2023-04-08 MED ORDER — MUPIROCIN 2 % EX OINT
1.0000 | TOPICAL_OINTMENT | Freq: Two times a day (BID) | CUTANEOUS | Status: DC
  Filled 2023-04-08: qty 22

## 2023-04-08 MED ORDER — INSULIN ASPART 100 UNIT/ML IJ SOLN
0.0000 [IU] | INTRAMUSCULAR | Status: DC
  Administered 2023-04-08 (×2): 3 [IU] via SUBCUTANEOUS
  Administered 2023-04-09: 1 [IU] via SUBCUTANEOUS
  Administered 2023-04-09: 5 [IU] via SUBCUTANEOUS
  Administered 2023-04-09: 3 [IU] via SUBCUTANEOUS
  Administered 2023-04-09: 1 [IU] via SUBCUTANEOUS
  Administered 2023-04-09: 2 [IU] via SUBCUTANEOUS
  Administered 2023-04-10: 1 [IU] via SUBCUTANEOUS
  Administered 2023-04-10: 3 [IU] via SUBCUTANEOUS
  Administered 2023-04-10: 1 [IU] via SUBCUTANEOUS
  Filled 2023-04-08 (×10): qty 1

## 2023-04-08 NOTE — Plan of Care (Signed)
Problem: Education: Goal: Ability to describe self-care measures that may prevent or decrease complications (Diabetes Survival Skills Education) will improve Outcome: Progressing Goal: Individualized Educational Video(s) Outcome: Progressing   Problem: Coping: Goal: Ability to adjust to condition or change in health will improve Outcome: Progressing   Problem: Fluid Volume: Goal: Ability to maintain a balanced intake and output will improve Outcome: Progressing   Problem: Health Behavior/Discharge Planning: Goal: Ability to identify and utilize available resources and services will improve Outcome: Progressing Goal: Ability to manage health-related needs will improve Outcome: Progressing   Problem: Metabolic: Goal: Ability to maintain appropriate glucose levels will improve Outcome: Progressing   Problem: Nutritional: Goal: Maintenance of adequate nutrition will improve Outcome: Progressing Goal: Progress toward achieving an optimal weight will improve Outcome: Progressing   Problem: Skin Integrity: Goal: Risk for impaired skin integrity will decrease Outcome: Progressing   Problem: Tissue Perfusion: Goal: Adequacy of tissue perfusion will improve Outcome: Progressing   Problem: Education: Goal: Ability to describe self-care measures that may prevent or decrease complications (Diabetes Survival Skills Education) will improve Outcome: Progressing Goal: Individualized Educational Video(s) Outcome: Progressing   Problem: Cardiac: Goal: Ability to maintain an adequate cardiac output will improve Outcome: Progressing   Problem: Health Behavior/Discharge Planning: Goal: Ability to identify and utilize available resources and services will improve Outcome: Progressing Goal: Ability to manage health-related needs will improve Outcome: Progressing   Problem: Fluid Volume: Goal: Ability to achieve a balanced intake and output will improve Outcome: Progressing    Problem: Metabolic: Goal: Ability to maintain appropriate glucose levels will improve Outcome: Progressing   Problem: Nutritional: Goal: Maintenance of adequate nutrition will improve Outcome: Progressing Goal: Maintenance of adequate weight for body size and type will improve Outcome: Progressing   Problem: Respiratory: Goal: Will regain and/or maintain adequate ventilation Outcome: Progressing   Problem: Urinary Elimination: Goal: Ability to achieve and maintain adequate renal perfusion and functioning will improve Outcome: Progressing   Problem: Education: Goal: Knowledge of General Education information will improve Description: Including pain rating scale, medication(s)/side effects and non-pharmacologic comfort measures Outcome: Progressing   Problem: Health Behavior/Discharge Planning: Goal: Ability to manage health-related needs will improve Outcome: Progressing   Problem: Clinical Measurements: Goal: Ability to maintain clinical measurements within normal limits will improve Outcome: Progressing Goal: Will remain free from infection Outcome: Progressing Goal: Diagnostic test results will improve Outcome: Progressing Goal: Respiratory complications will improve Outcome: Progressing Goal: Cardiovascular complication will be avoided Outcome: Progressing   Problem: Activity: Goal: Risk for activity intolerance will decrease Outcome: Progressing   Problem: Nutrition: Goal: Adequate nutrition will be maintained Outcome: Progressing   Problem: Coping: Goal: Level of anxiety will decrease Outcome: Progressing   Problem: Elimination: Goal: Will not experience complications related to bowel motility Outcome: Progressing Goal: Will not experience complications related to urinary retention Outcome: Progressing   Problem: Pain Management: Goal: General experience of comfort will improve Outcome: Progressing   Problem: Safety: Goal: Ability to remain free  from injury will improve Outcome: Progressing   Problem: Skin Integrity: Goal: Risk for impaired skin integrity will decrease Outcome: Progressing   Problem: Coping: Goal: Ability to adjust to condition or change in health will improve Outcome: Progressing   Problem: Fluid Volume: Goal: Ability to maintain a balanced intake and output will improve Outcome: Progressing   Problem: Health Behavior/Discharge Planning: Goal: Ability to identify and utilize available resources and services will improve Outcome: Progressing Goal: Ability to manage health-related needs will improve Outcome: Progressing   Problem: Coping:  Goal: Ability to adjust to condition or change in health will improve Outcome: Progressing   Problem: Health Behavior/Discharge Planning: Goal: Ability to identify and utilize available resources and services will improve Outcome: Progressing   Problem: Skin Integrity: Goal: Risk for impaired skin integrity will decrease Outcome: Progressing   Problem: Skin Integrity: Goal: Risk for impaired skin integrity will decrease Outcome: Progressing   Problem: Education: Goal: Ability to describe self-care measures that may prevent or decrease complications (Diabetes Survival Skills Education) will improve Outcome: Progressing

## 2023-04-08 NOTE — Inpatient Diabetes Management (Addendum)
Inpatient Diabetes Program Recommendations  AACE/ADA: New Consensus Statement on Inpatient Glycemic Control (2015)  Target Ranges:  Prepandial:   less than 140 mg/dL      Peak postprandial:   less than 180 mg/dL (1-2 hours)      Critically ill patients:  140 - 180 mg/dL    Latest Reference Range & Units 04/07/23 10:45 04/07/23 11:44 04/07/23 12:37 04/07/23 15:26 04/07/23 19:44  Glucose-Capillary 70 - 99 mg/dL 865 (H)  IV Insulin Drip Running  5 units Semglee @1024  124 (H) 116 (H)  IV Insulin Drip Stopped 94 300 (H)  8 units Novolog   (H): Data is abnormally high  Latest Reference Range & Units 04/07/23 23:25 04/08/23 03:53 04/08/23 04:02 04/08/23 04:26 04/08/23 05:31 04/08/23 06:34 04/08/23 06:55  Glucose-Capillary 70 - 99 mg/dL 784 (H)  8 units Novolog  49 (L) 45 (L) 181 (H) 83 41 (LL) 241 (H)  (LL): Data is critically low (H): Data is abnormally high (L): Data is abnormally low    Admit with: DKA   History: Type 1 Diabetes, Gastroparesis, Feeding Tube   Home DM Meds: Novolog 0-6 units QID      Novolog 2 units TID      Basaglar 7 units Daily   Current Orders: Semglee 5 units Daily        Novolog Moderate Correction Scale/ SSI (0-15 units) Q4 hours    Pt is well known to the Inpatient Diabetes Team--Multiple past hospitalizations  Very Sensitive to large doses of Insulin--CBGs can drop quickly   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 Reduce the Novolog SSI to the 0-6 unit (Very Sensitive) scale Q4 hours  Likely needs small amount of Novolog coverage for tube feeds:   2. Please start Novolog 2 units Q4 hours HOLD if tube Feeds HELD for any reason     --Will follow patient during hospitalization--  Ambrose Finland RN, MSN, CDCES Diabetes Coordinator Inpatient Glycemic Control  Team Team Pager: [phone] (8a-5p)

## 2023-04-08 NOTE — Progress Notes (Signed)
Report given to receiving nurse for room 117.

## 2023-04-08 NOTE — Progress Notes (Signed)
Patient's CBG is 64, patient's CBG continues to intermittently drop requiring dextrose amps, reached out to Dr. Clide Dales who would like for dextrose amps to be continued in the presence of low blood sugars.

## 2023-04-08 NOTE — Progress Notes (Signed)
Progress Note   Patient: Ashley Camacho QMV:[id number] DOB: 1990-07-22 DOA: 04/06/2023     2 DOS: the patient was seen and examined on 04/08/2023   Brief hospital course: AHANA HANDLER is a 32 y.o. Caucasian female with medical history significant for type 1 diabetes mellitus who presented to the ER with acute onset of recurrent nausea and vomiting over the last couple weeks with associated diarrhea with loose bowel movements.  She denies any bilious vomitus or hematemesis. She denied any abdominal pain. She has been missing her insulin intermittently.  Patient is admitted to the hospitalist service with impression of DKA, intractable nausea and vomiting.  Assessment and Plan: * DKA (diabetic ketoacidosis) (HCC) Associated with uncontrolled type 1 diabetes mellitus. Sugars labile. Low sugars noted. Continue 5 units lantus, very sensitive subcu sliding scale. Stop IV fluids. She is able to tolerate tube feeds but unable to tolerate oral due to nausea. Oral meds to be changed thru PEG. Transfer to med-surg floor.  Intractable nausea and vomiting. Gastroparesis Patient will be continued on Reglan therapy. Continue IV Zofran, phenergan as needed. IV PPI. Unable to tolerate oral diet. She may need bolus feeds at home, discussed with dietician.  AKI: Prerenal in the setting of poor oral intake, severe nausea vomiting. Renal function improving. Continue tube feeds with free water. Avoid nephrotoxic drugs. Monitor daily renal function, electrolytes.  Hypothyroidism Continue home dose Synthroid.  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  Depression Continue Prozac.  Nutrition Documentation    Flowsheet Row ED to Hosp-Admission (Current) from [facility]06/20[facility] in [facility]  Nutrition Problem Moderate Malnutrition  Etiology chronic illness  [gastroparesis]  Nutrition Goal Patient will meet greater than or equal to 90% of their needs   Interventions Tube feeding     Out of bed to chair. Incentive spirometry. Nursing supportive care. Fall, aspiration precautions. DVT prophylaxis   Code Status: Full Code  Subjective: Patient is seen and examined today morning.  She has nausea unable to tolerate diet. Mother at bedside concerned. Able to tolerate PEG feeds. Sugars labile noted.  Physical Exam: Vitals:   04/08/23 1100 04/08/23 1200 04/08/23 1221 04/08/23 1300  BP: 109/84 (!) 122/90  (!) 136/92  Pulse: 89 (!) 109 (!) 103 (!) 112  Resp: (!) 23 15 (!) 26 18  Temp:  98.6 F (37 C)    TempSrc:  Oral    SpO2: 100% 100% 100% 100%  Weight:      Height:        General - Young thin built Caucasian female, distress due to nausea HEENT - PERRLA, EOMI, atraumatic head, non tender sinuses. Lung - Clear, no rales, rhonchi, wheezes. Heart - S1, S2 heard, no murmurs, rubs, no pedal edema. Abdomen - Soft, non tender, bowel sounds good Neuro - Alert, awake and oriented x 3, non focal exam. Skin - Warm and dry.  Data Reviewed:      Latest Ref Rng & Units 04/08/2023    5:36 AM 04/07/2023    4:42 AM 04/06/2023    4:45 PM  CBC  WBC 4.0 - 10.5 K/uL 5.4  7.0  7.3   Hemoglobin 12.0 - 15.0 g/dL 8.7  8.0  28.4   Hematocrit 36.0 - 46.0 % 26.7  24.3  32.8   Platelets 150 - 400 K/uL 153  163  211       Latest Ref Rng & Units 04/08/2023    5:36 AM 04/07/2023    1:59  PM 04/07/2023    9:52 AM  BMP  Glucose 70 - 99 mg/dL 88  91  960   BUN 6 - 20 mg/dL 28  29  33   Creatinine 0.44 - 1.00 mg/dL 4.54  0.98  1.19   Sodium 135 - 145 mmol/L 145  143  143   Potassium 3.5 - 5.1 mmol/L 4.3  4.1  4.3   Chloride 98 - 111 mmol/L 112  112  112   CO2 22 - 32 mmol/L 24  23  24    Calcium 8.9 - 10.3 mg/dL 8.7  8.5  8.2    No results found.   Family Communication: Discussed with patient, mother at bedside. They understand and agree. All questions answereed.  Disposition: Status is: Inpatient Remains inpatient appropriate because:   nausea, unable to tolerate diet.  Planned Discharge Destination: Home     MDM level 3- Patient admitted with DKA, intractable nausea and vomiting. Sugars labile. Insulin drip changed to sq. She is at high risk for electrolyte abnormalities, sudden clinical deterioration.  Author: Marcelino Duster, MD 04/08/2023 2:48 PM Secure chat 7am to 7pm For on call review [email or web address].

## 2023-04-08 NOTE — Progress Notes (Signed)
Patient transferred to room 117 in stable condition.

## 2023-04-08 NOTE — Progress Notes (Signed)
Nutrition Follow-up  DOCUMENTATION CODES:   Underweight, Non-severe (moderate) malnutrition in context of chronic illness  INTERVENTION:   Continue TF via j-tube:    Glucerna 1.5 @ 50 ml/hr   Free water flushes 50ml q4 hours to maintain tube patency    Regimen provides 1800 kcal/day, 99 g/day protein and 911 ml/day of free water. Total free water: 1211 ml daily  NUTRITION DIAGNOSIS:   Moderate Malnutrition related to chronic illness (gastroparesis) as evidenced by mild fat depletion, mild muscle depletion, moderate muscle depletion.  Ongoing  GOAL:   Patient will meet greater than or equal to 90% of their needs  Met with TF  MONITOR:   PO intake, TF tolerance  REASON FOR ASSESSMENT:   Consult Assessment of nutrition requirement/status, Enteral/tube feeding initiation and management  ASSESSMENT:   Pt with medical history significant for type 1 diabetes mellitus who presented with acute onset of recurrent nausea and vomiting over the last couple weeks with associated diarrhea with loose bowel movements.  Reviewed I/O's: +877 ml x 24 hours and +1.1 L since admission  UOP: 900 ml x 24 hours   Case discussed with MD, who wanted to discuss possible of bolus feedings at discharge. Discussed that pt is not a candidate to bolus feeds secondary to j-tube. Pt has been prescribed nocturnal feedings at home, however, pt has been noncompliant with feeding regimen. Per MD, pt mother concerned about pt's poor oral intake and frequent admissions. Pt is still unable to tolerate PO's. Per MD, will re-engage GI if unable to tolerate PO's over the weekend.   Pt tolerating TF well, however, noted episodes of hypoglycemia. Pt still unable to tolerate PO's at this time.   Medications reviewed and include lovenox, reglan, and phenergan.   Labs reviewed: CBGS: 41-281 (inpatient orders for glycemic control are 0-6 units insulin aspart every 4 hours and 5 units insulin glargine-yfgn daily).     Diet Order:   Diet Order             Diet NPO time specified Except for: Sips with Meds  Diet effective now                   EDUCATION NEEDS:   No education needs have been identified at this time  Skin:  Skin Assessment: Reviewed RN Assessment  Last BM:  04/05/23  Height:   Ht Readings from Last 1 Encounters:  04/06/23 5\' 1"  (1.549 m)    Weight:   Wt Readings from Last 1 Encounters:  04/08/23 42.4 kg    Ideal Body Weight:  47.7 kg  BMI:  Body mass index is 17.66 kg/m.  Estimated Nutritional Needs:   Kcal:  1700-1900  Protein:  85-100 grams  Fluid:  > 1.7 L    Levada Schilling, RD, LDN, CDCES Registered Dietitian III Certified Diabetes Care and Education Specialist Please refer to [facility] for RD and/or RD on-call/weekend/after hours pager

## 2023-04-08 NOTE — Plan of Care (Signed)
Continuing with plan of care. 

## 2023-04-09 DIAGNOSIS — E2740 Unspecified adrenocortical insufficiency: Secondary | ICD-10-CM | POA: Diagnosis not present

## 2023-04-09 DIAGNOSIS — Z934 Other artificial openings of gastrointestinal tract status: Secondary | ICD-10-CM | POA: Diagnosis not present

## 2023-04-09 DIAGNOSIS — R112 Nausea with vomiting, unspecified: Secondary | ICD-10-CM | POA: Diagnosis not present

## 2023-04-09 DIAGNOSIS — E1010 Type 1 diabetes mellitus with ketoacidosis without coma: Secondary | ICD-10-CM | POA: Diagnosis not present

## 2023-04-09 DIAGNOSIS — E039 Hypothyroidism, unspecified: Secondary | ICD-10-CM | POA: Diagnosis not present

## 2023-04-09 DIAGNOSIS — N179 Acute kidney failure, unspecified: Secondary | ICD-10-CM | POA: Diagnosis not present

## 2023-04-09 DIAGNOSIS — E440 Moderate protein-calorie malnutrition: Secondary | ICD-10-CM | POA: Diagnosis not present

## 2023-04-09 LAB — GLUCOSE, CAPILLARY
Glucose-Capillary: 108 mg/dL — ABNORMAL HIGH (ref 70–99)
Glucose-Capillary: 163 mg/dL — ABNORMAL HIGH (ref 70–99)
Glucose-Capillary: 178 mg/dL — ABNORMAL HIGH (ref 70–99)
Glucose-Capillary: 229 mg/dL — ABNORMAL HIGH (ref 70–99)
Glucose-Capillary: 255 mg/dL — ABNORMAL HIGH (ref 70–99)
Glucose-Capillary: 287 mg/dL — ABNORMAL HIGH (ref 70–99)
Glucose-Capillary: 353 mg/dL — ABNORMAL HIGH (ref 70–99)

## 2023-04-09 MED ORDER — OMEPRAZOLE 2 MG/ML ORAL SUSPENSION: 40.0000 mg | Freq: Every day | ORAL | Status: DC

## 2023-04-09 MED ORDER — OMEPRAZOLE 20 MG PO TBDD
40.0000 mg | DELAYED_RELEASE_TABLET | Freq: Every day | ORAL | Status: DC
  Administered 2023-04-09 – 2023-04-10 (×2): 40 mg via ORAL
  Filled 2023-04-09 (×2): qty 2

## 2023-04-09 MED ORDER — METOCLOPRAMIDE HCL 10 MG/10ML PO SOLN
10.0000 mg | Freq: Three times a day (TID) | ORAL | Status: DC
  Administered 2023-04-09 – 2023-04-10 (×4): 10 mg
  Filled 2023-04-09 (×6): qty 10

## 2023-04-09 MED ORDER — ONDANSETRON 4 MG PO TBDP: 4.0000 mg | ORAL_TABLET | Freq: Four times a day (QID) | ORAL | Status: DC | PRN

## 2023-04-09 MED ORDER — GUAIFENESIN 100 MG/5ML PO LIQD
5.0000 mL | ORAL | Status: DC | PRN
  Administered 2023-04-09: 5 mL via ORAL
  Filled 2023-04-09: qty 10

## 2023-04-09 NOTE — Progress Notes (Signed)
Patient is alert and oriented X 4. Not able to get new I/v on her by I/V team. Md made aware.plan of care is ongoing.

## 2023-04-09 NOTE — Progress Notes (Signed)
Progress Note   Patient: Ashley Camacho:[id number] DOB: [birth year] DOA: 04/06/2023     3 DOS: the patient was seen and examined on 04/09/2023   Brief hospital course: BLESSIN GEERTS is a 32 y.o. Caucasian female with medical history significant for type 1 diabetes mellitus who presented to the ER with acute onset of recurrent nausea and vomiting over the last couple weeks with associated diarrhea with loose bowel movements.  She denies any bilious vomitus or hematemesis. She denied any abdominal pain. She has been missing her insulin intermittently.  Patient is admitted to the hospitalist service with impression of DKA, intractable nausea and vomiting.  Assessment and Plan: * DKA (diabetic ketoacidosis) (HCC) Associated with uncontrolled type 1 diabetes mellitus. Sugars high today.  Increase to 7 units lantus, continue very sensitive subcu sliding scale. Hard stick, unable to get IV. Nausea better, advised Zofran ODT. She is able to tolerate tube feeds, advised sips of water,  Transfer to med-surg floor.  Intractable nausea and vomiting. Gastroparesis Unable to get IV line per RN. Continue ODT zofran and other meds thru J tube. Change IV PPI to J tube She wants to try clears today as nausea improved.  AKI: Prerenal in the setting of poor oral intake, severe nausea vomiting. Renal function improving. Continue tube feeds with free water. Avoid nephrotoxic drugs. Monitor daily renal function, electrolytes.  Hypothyroidism Continue home dose Synthroid.  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  Depression Continue Prozac.  Nutrition Documentation    Flowsheet Row ED to Hosp-Admission (Current) from [facility]06/2023 in [facility]  Nutrition Problem Moderate Malnutrition  Etiology chronic illness  [gastroparesis]  Nutrition Goal Patient will meet greater than or equal to 90% of their needs  Interventions Tube feeding     Out  of bed to chair. Incentive spirometry. Nursing supportive care. Fall, aspiration precautions. DVT prophylaxis   Code Status: Full Code  Subjective: Patient is seen and examined today morning.  Nausea better, wishes to try clears. RN at bedside unable to get IV line. Will transition meds thru J tube, advised to try ODT Zofran for nausea.   Physical Exam: Vitals:   04/09/23 0325 04/09/23 0500 04/09/23 0754 04/09/23 1018  BP: 104/72  110/77 118/81  Pulse: 96  94 85  Resp: 16  16 16   Temp: 98.3 F (36.8 C)  98.9 F (37.2 C) 98.3 F (36.8 C)  TempSrc: Oral   Oral  SpO2: 100%  97% 98%  Weight:  45.7 kg    Height:        General - Young thin built Caucasian female, distress due to nausea HEENT - PERRLA, EOMI, atraumatic head, non tender sinuses. Lung - Clear, no rales, rhonchi, wheezes. Heart - S1, S2 heard, no murmurs, rubs, no pedal edema. Abdomen - Soft, non tender, bowel sounds good Neuro - Alert, awake and oriented x 3, non focal exam. Skin - Warm and dry.  Data Reviewed:      Latest Ref Rng & Units 04/08/2023    5:36 AM 04/07/2023    4:42 AM 04/06/2023    4:45 PM  CBC  WBC 4.0 - 10.5 K/uL 5.4  7.0  7.3   Hemoglobin 12.0 - 15.0 g/dL 8.7  8.0  81.1   Hematocrit 36.0 - 46.0 % 26.7  24.3  32.8   Platelets 150 - 400 K/uL 153  163  211       Latest Ref Rng & Units 04/08/2023  5:36 AM 04/07/2023    1:59 PM 04/07/2023    9:52 AM  BMP  Glucose 70 - 99 mg/dL 88  91  010   BUN 6 - 20 mg/dL 28  29  33   Creatinine 0.44 - 1.00 mg/dL 2.72  5.36  6.44   Sodium 135 - 145 mmol/L 145  143  143   Potassium 3.5 - 5.1 mmol/L 4.3  4.1  4.3   Chloride 98 - 111 mmol/L 112  112  112   CO2 22 - 32 mmol/L 24  23  24    Calcium 8.9 - 10.3 mg/dL 8.7  8.5  8.2    No results found.   Family Communication: Discussed with patient, she understand and agree. All questions answereed.  Disposition: Status is: Inpatient Remains inpatient appropriate because:  nausea, advance diet.   Planned Discharge Destination: Home     Time spent - 38 min.  Author: Marcelino Duster, MD 04/09/2023 11:15 AM Secure chat 7am to 7pm For on call review [email or web address].

## 2023-04-09 NOTE — Plan of Care (Addendum)
Patient is alert and oriented X 4.she has been getting a feeding at the rate of 50 ml /hrs through  peg tube as order.no any complain of nausea and vomiting.  Problem: Education: Goal: Ability to describe self-care measures that may prevent or decrease complications (Diabetes Survival Skills Education) will improve Outcome: Progressing Goal: Individualized Educational Video(s) Outcome: Progressing   Problem: Coping: Goal: Ability to adjust to condition or change in health will improve Outcome: Progressing   Problem: Fluid Volume: Goal: Ability to maintain a balanced intake and output will improve Outcome: Progressing   Problem: Health Behavior/Discharge Planning: Goal: Ability to identify and utilize available resources and services will improve Outcome: Progressing Goal: Ability to manage health-related needs will improve Outcome: Progressing   Problem: Metabolic: Goal: Ability to maintain appropriate glucose levels will improve Outcome: Progressing   Problem: Nutritional: Goal: Maintenance of adequate nutrition will improve Outcome: Progressing Goal: Progress toward achieving an optimal weight will improve Outcome: Progressing   Problem: Skin Integrity: Goal: Risk for impaired skin integrity will decrease Outcome: Progressing   Problem: Tissue Perfusion: Goal: Adequacy of tissue perfusion will improve Outcome: Progressing   Problem: Education: Goal: Ability to describe self-care measures that may prevent or decrease complications (Diabetes Survival Skills Education) will improve Outcome: Progressing Goal: Individualized Educational Video(s) Outcome: Progressing   Problem: Cardiac: Goal: Ability to maintain an adequate cardiac output will improve Outcome: Progressing   Problem: Health Behavior/Discharge Planning: Goal: Ability to identify and utilize available resources and services will improve Outcome: Progressing Goal: Ability to manage health-related needs  will improve Outcome: Progressing   Problem: Fluid Volume: Goal: Ability to achieve a balanced intake and output will improve Outcome: Progressing   Problem: Metabolic: Goal: Ability to maintain appropriate glucose levels will improve Outcome: Progressing   Problem: Nutritional: Goal: Maintenance of adequate nutrition will improve Outcome: Progressing Goal: Maintenance of adequate weight for body size and type will improve Outcome: Progressing   Problem: Respiratory: Goal: Will regain and/or maintain adequate ventilation Outcome: Progressing   Problem: Urinary Elimination: Goal: Ability to achieve and maintain adequate renal perfusion and functioning will improve Outcome: Progressing   Problem: Education: Goal: Knowledge of General Education information will improve Description: Including pain rating scale, medication(s)/side effects and non-pharmacologic comfort measures Outcome: Progressing   Problem: Health Behavior/Discharge Planning: Goal: Ability to manage health-related needs will improve Outcome: Progressing   Problem: Clinical Measurements: Goal: Ability to maintain clinical measurements within normal limits will improve Outcome: Progressing Goal: Will remain free from infection Outcome: Progressing Goal: Diagnostic test results will improve Outcome: Progressing Goal: Respiratory complications will improve Outcome: Progressing Goal: Cardiovascular complication will be avoided Outcome: Progressing   Problem: Activity: Goal: Risk for activity intolerance will decrease Outcome: Progressing   Problem: Nutrition: Goal: Adequate nutrition will be maintained Outcome: Progressing   Problem: Coping: Goal: Level of anxiety will decrease Outcome: Progressing   Problem: Elimination: Goal: Will not experience complications related to bowel motility Outcome: Progressing Goal: Will not experience complications related to urinary retention Outcome: Progressing    Problem: Pain Management: Goal: General experience of comfort will improve Outcome: Progressing   Problem: Safety: Goal: Ability to remain free from injury will improve Outcome: Progressing   Problem: Skin Integrity: Goal: Risk for impaired skin integrity will decrease Outcome: Progressing   Problem: Coping: Goal: Ability to adjust to condition or change in health will improve Outcome: Progressing   Problem: Fluid Volume: Goal: Ability to maintain a balanced intake and output will improve Outcome: Progressing  Problem: Health Behavior/Discharge Planning: Goal: Ability to identify and utilize available resources and services will improve Outcome: Progressing Goal: Ability to manage health-related needs will improve Outcome: Progressing

## 2023-04-10 DIAGNOSIS — E039 Hypothyroidism, unspecified: Secondary | ICD-10-CM | POA: Diagnosis not present

## 2023-04-10 DIAGNOSIS — E2740 Unspecified adrenocortical insufficiency: Secondary | ICD-10-CM | POA: Diagnosis not present

## 2023-04-10 DIAGNOSIS — E440 Moderate protein-calorie malnutrition: Secondary | ICD-10-CM | POA: Diagnosis not present

## 2023-04-10 DIAGNOSIS — Z934 Other artificial openings of gastrointestinal tract status: Secondary | ICD-10-CM | POA: Diagnosis not present

## 2023-04-10 DIAGNOSIS — N179 Acute kidney failure, unspecified: Secondary | ICD-10-CM | POA: Diagnosis not present

## 2023-04-10 DIAGNOSIS — E1010 Type 1 diabetes mellitus with ketoacidosis without coma: Secondary | ICD-10-CM | POA: Diagnosis not present

## 2023-04-10 DIAGNOSIS — R112 Nausea with vomiting, unspecified: Secondary | ICD-10-CM | POA: Diagnosis not present

## 2023-04-10 LAB — GLUCOSE, CAPILLARY
Glucose-Capillary: 161 mg/dL — ABNORMAL HIGH (ref 70–99)
Glucose-Capillary: 182 mg/dL — ABNORMAL HIGH (ref 70–99)
Glucose-Capillary: 259 mg/dL — ABNORMAL HIGH (ref 70–99)
Glucose-Capillary: 76 mg/dL (ref 70–99)

## 2023-04-10 MED ORDER — PROMETHAZINE HCL 25 MG PO TABS: 12.5000 mg | ORAL_TABLET | Freq: Three times a day (TID) | ORAL | 0 refills | Status: DC | PRN

## 2023-04-10 MED ORDER — GLUCERNA 1.5 CAL PO LIQD: 1000.0000 mL | Freq: Every day | ORAL | Status: DC

## 2023-04-10 MED ORDER — ONDANSETRON 4 MG PO TBDP: 4.0000 mg | ORAL_TABLET | Freq: Three times a day (TID) | ORAL | 0 refills | Status: DC | PRN

## 2023-04-10 MED ORDER — FLUOXETINE HCL 10 MG PO CAPS: 10.0000 mg | ORAL_CAPSULE | Freq: Every day | ORAL | 0 refills | Status: DC

## 2023-04-10 NOTE — Progress Notes (Signed)
Per pt, she tolerated lunch without difficulty, no complaints

## 2023-04-10 NOTE — Discharge Summary (Signed)
Physician Discharge Summary   Patient: Ashley Camacho MRN: [id number] DOB: Oct 12, 1990  Admit date:     04/06/2023  Discharge date: 04/10/23  Discharge Physician: Marcelino Duster   PCP: Pcp, No   Recommendations at discharge:    PCP follow up in 1 week.  Discharge Diagnoses: Principal Problem:   DKA (diabetic ketoacidosis) (HCC) Active Problems:   AKI (acute kidney injury) (HCC)   Intractable nausea and vomiting   Hypothyroidism   Jejunostomy tube present (HCC)   Malnutrition of moderate degree   Depression   Hypoadrenalism (HCC)  Resolved Problems:   * No resolved hospital problems. *  Hospital Course: Ashley Camacho is a 32 y.o. Caucasian female with medical history significant for type 1 diabetes mellitus who presented to the ER with acute onset of recurrent nausea and vomiting over the last couple weeks with associated diarrhea with loose bowel movements.  She denies any bilious vomitus or hematemesis. She denied any abdominal pain. She has been missing her insulin intermittently.  Patient is admitted to the hospitalist service with impression of DKA, intractable nausea and vomiting.   Assessment and Plan: * DKA (diabetic ketoacidosis) (HCC) Associated with uncontrolled type 1 diabetes mellitus. Sugars improved, changed to sq insulin with tube feeds 24hr as she is unable to tolerate diet. Increased Lantus to home dose 7 units with very sensitive subcu sliding scale. Her nausea improved, she is able to tolerate oral feeds.  Blood sugars better.   Advised her to continue home dose Lantus, sliding scale insulin as she was doing before with tube feeds running at night.  Encouraged to follow-up with PCP and also follow-up endocrinology upon discharge as instructed.  Patient and stands and agrees with the discharge plan.   Intractable nausea and vomiting. Gastroparesis Patient's IV antiemetics, motility agents changed to oral Continue ODT zofran. Advised oral PPI  therapy. Patient is able to tolerate diet.  She is hemodynamically stable to be discharged home   AKI: Prerenal in the setting of poor oral intake, severe nausea vomiting. Renal function improved. Advised outpatient follow-up with PCP.   Hypothyroidism Continue home dose Synthroid.   Hypoadrenalism (HCC) Continue home dose Cortef therapy.   Depression Continue Prozac.   Nutrition Documentation     Flowsheet Row ED to Hosp-Admission (Current) from 04/06/2023 in [facility] ICU/CCU  Nutrition Problem Moderate Malnutrition  Etiology chronic illness  [gastroparesis]  Nutrition Goal Patient will meet greater than or equal to 90% of their needs  Interventions Tube feeding     Consultants: none Procedures performed: none  Disposition: Home Diet recommendation:  Discharge Diet Orders (From admission, onward)     Start     Ordered   04/10/23 0000  Diet Carb Modified        04/10/23 1210           Carb modified diet, Tube feeds at night. DISCHARGE MEDICATION: Allergies as of 04/10/2023   No Known Allergies      Medication List     TAKE these medications    acetaminophen 500 MG tablet Commonly known as: TYLENOL Take 2 tablets (1,000 mg total) by mouth every 6 (six) hours as needed for headache, fever or moderate pain.   Basaglar KwikPen 100 UNIT/ML Inject 7 Units into the skin daily.   divalproex 250 MG 24 hr tablet Commonly known as: DEPAKOTE ER Take 1 tablet (250 mg total) by mouth daily.   feeding supplement (GLUCERNA 1.5 CAL) Liqd Place 640 mLs into feeding  tube at bedtime.   FLUoxetine 10 MG capsule Commonly known as: PROZAC Take 1 capsule (10 mg total) by mouth daily.   hydrocortisone 10 MG tablet Commonly known as: CORTEF Take 2 tablets (20 mg total) by mouth daily with breakfast AND 1 tablet (10 mg total) daily with supper.   insulin aspart 100 UNIT/ML injection Commonly known as: NovoLOG Inject 2 Units into the skin 3  (three) times daily with meals.   insulin aspart 100 UNIT/ML injection Commonly known as: novoLOG Inject 0-6 Units into the skin 4 (four) times daily - after meals and at bedtime.   levothyroxine 75 MCG tablet Commonly known as: SYNTHROID Take 1 tablet (75 mcg total) by mouth daily at 6 (six) AM.   loperamide 2 MG capsule Commonly known as: IMODIUM Take 2 capsules (4 mg total) by mouth as needed for diarrhea or loose stools.   ondansetron 4 MG disintegrating tablet Commonly known as: ZOFRAN-ODT Take 1 tablet (4 mg total) by mouth every 8 (eight) hours as needed for nausea or vomiting.   Pancrelipase (Lip-Prot-Amyl) 24000-76000 units Cpep Take 1 capsule (24,000 Units total) by mouth 3 (three) times daily before meals.   promethazine 25 MG tablet Commonly known as: PHENERGAN Take 0.5 tablets (12.5 mg total) by mouth every 8 (eight) hours as needed for nausea or vomiting. What changed: when to take this        Discharge Exam: Filed Weights   04/08/23 0413 04/09/23 0500 04/10/23 0500  Weight: 42.4 kg 45.7 kg 45.7 kg   General - Young thin built Caucasian female, distress due to nausea HEENT - PERRLA, EOMI, atraumatic head, non tender sinuses. Lung - Clear, no rales, rhonchi, wheezes. Heart - S1, S2 heard, no murmurs, rubs, no pedal edema. Abdomen - Soft, non tender, bowel sounds good Neuro - Alert, awake and oriented x 3, non focal exam. Skin - Warm and dry.  Condition at discharge: stable  The results of significant diagnostics from this hospitalization (including imaging, microbiology, ancillary and laboratory) are listed below for reference.   Imaging Studies: DG Chest 2 View  Result Date: 03/22/2023 CLINICAL DATA:  Generalized weakness, nausea, vomiting, cough, dry heaving EXAM: CHEST - 2 VIEW COMPARISON:  07/27/2022 FINDINGS: The heart size and mediastinal contours are within normal limits. Both lungs are clear. The visualized skeletal structures are unremarkable.  IMPRESSION: No active cardiopulmonary disease. Electronically Signed   By: Minerva Fester M.D.   On: 03/22/2023 23:30   CT HEAD WO CONTRAST ( )  Result Date: 03/22/2023 CLINICAL DATA:  Headache, generalized weakness, neuro deficit, vomiting EXAM: CT HEAD WITHOUT CONTRAST TECHNIQUE: Contiguous axial images were obtained from the base of the skull through the vertex without intravenous contrast. RADIATION DOSE REDUCTION: This exam was performed according to the departmental dose-optimization program which includes automated exposure control, adjustment of the mA and/or kV according to patient size and/or use of iterative reconstruction technique. COMPARISON:  CT 07/27/2022 FINDINGS: Brain: No intracranial hemorrhage, mass effect, or evidence of acute infarct. No hydrocephalus. No extra-axial fluid collection. Vascular: No hyperdense vessel or unexpected calcification. Skull: No fracture or focal lesion. Sinuses/Orbits: No mucosal thickening in the ethmoid air cells. Other: None. IMPRESSION: No acute intracranial abnormality. Electronically Signed   By: Minerva Fester M.D.   On: 03/22/2023 23:13    Microbiology: Results for orders placed or performed during the hospital encounter of 04/06/23  MRSA Next Gen by PCR, Nasal     Status: None   Collection Time: 04/06/23 10:48 PM  Specimen: Nasal Mucosa; Nasal Swab  Result Value Ref Range Status   MRSA by PCR Next Gen NOT DETECTED NOT DETECTED Final    Comment: (NOTE) The GeneXpert MRSA Assay (FDA approved for NASAL specimens only), is one component of a comprehensive MRSA colonization surveillance program. It is not intended to diagnose MRSA infection nor to guide or monitor treatment for MRSA infections. Test performance is not FDA approved in patients less than 26 years old. Performed at [facility], [address] [place], [place] [address]     Labs: CBC: Recent Labs  Lab 04/06/23 1645 04/07/23 0442 04/08/23 0536  WBC 7.3  7.0 5.4  NEUTROABS  --   --  4.1  HGB 10.1* 8.0* 8.7*  HCT 32.8* 24.3* 26.7*  MCV 94.3 88.7 88.1  PLT 211 163 153   Basic Metabolic Panel: Recent Labs  Lab 04/07/23 0200 04/07/23 0442 04/07/23 0952 04/07/23 1359 04/08/23 0536  NA 142 142 143 143 145  K 4.4 4.6 4.3 4.1 4.3  CL 111 111 112* 112* 112*  CO2 21* 24 24 23 24   GLUCOSE 185* 139* 119* 91 88  BUN 41* 39* 33* 29* 28*  CREATININE 2.24* 2.05* 1.77* 1.57* 1.43*  CALCIUM 8.2* 8.2* 8.2* 8.5* 8.7*  MG  --   --   --   --  2.2  PHOS  --   --   --   --  1.7*   Liver Function Tests: Recent Labs  Lab 04/06/23 1645  AST 14*  ALT 10  ALKPHOS 87  BILITOT 1.7*  PROT 8.1  ALBUMIN 4.2   CBG: Recent Labs  Lab 04/09/23 2001 04/10/23 0006 04/10/23 0445 04/10/23 0754 04/10/23 1151  GLUCAP 229* 259* 76 161* 182*    Discharge time spent: 38 minutes.  Signed: Marcelino Duster, MD [facility] 04/10/2023

## 2023-04-11 ENCOUNTER — Telehealth: Payer: Self-pay

## 2023-04-24 DIAGNOSIS — Z419 Encounter for procedure for purposes other than remedying health state, unspecified: Secondary | ICD-10-CM | POA: Diagnosis not present

## 2023-05-10 ENCOUNTER — Ambulatory Visit (INDEPENDENT_AMBULATORY_CARE_PROVIDER_SITE_OTHER): Payer: Medicaid Other | Admitting: Physician Assistant

## 2023-05-10 ENCOUNTER — Encounter: Payer: Self-pay | Admitting: Physician Assistant

## 2023-05-10 VITALS — BP 98/59 | HR 100 | Temp 97.7°F | Resp 14 | Ht 61.0 in | Wt 92.1 lb

## 2023-05-10 DIAGNOSIS — I5020 Unspecified systolic (congestive) heart failure: Secondary | ICD-10-CM | POA: Diagnosis not present

## 2023-05-10 DIAGNOSIS — R5383 Other fatigue: Secondary | ICD-10-CM

## 2023-05-10 DIAGNOSIS — E109 Type 1 diabetes mellitus without complications: Secondary | ICD-10-CM | POA: Diagnosis not present

## 2023-05-10 DIAGNOSIS — N1832 Chronic kidney disease, stage 3b: Secondary | ICD-10-CM | POA: Diagnosis not present

## 2023-05-10 DIAGNOSIS — E038 Other specified hypothyroidism: Secondary | ICD-10-CM

## 2023-05-10 DIAGNOSIS — F339 Major depressive disorder, recurrent, unspecified: Secondary | ICD-10-CM | POA: Diagnosis not present

## 2023-05-10 DIAGNOSIS — K3184 Gastroparesis: Secondary | ICD-10-CM

## 2023-05-10 DIAGNOSIS — G43809 Other migraine, not intractable, without status migrainosus: Secondary | ICD-10-CM

## 2023-05-10 DIAGNOSIS — E2740 Unspecified adrenocortical insufficiency: Secondary | ICD-10-CM

## 2023-05-10 MED ORDER — LEVOTHYROXINE SODIUM 75 MCG PO TABS: 75.0000 ug | ORAL_TABLET | Freq: Every day | ORAL | 0 refills | Status: DC

## 2023-05-10 MED ORDER — INSULIN ASPART 100 UNIT/ML IJ SOLN: 2.0000 [IU] | Freq: Three times a day (TID) | INTRAMUSCULAR | 0 refills | Status: DC

## 2023-05-10 MED ORDER — INSULIN GLARGINE 100 UNIT/ML SOLOSTAR PEN: 6.0000 [IU] | PEN_INJECTOR | Freq: Every day | SUBCUTANEOUS | 11 refills | Status: DC

## 2023-05-10 NOTE — Progress Notes (Unsigned)
New patient visit  Patient: Ashley Camacho   DOB: Dec 22, 1990   31 y.o. Female  MRN: [id number] Visit Date: 05/10/2023  Today's healthcare provider: Debera Lat, PA-C   Chief Complaint  Patient presents with   Establish Care   Referral    Endo and Cardio and therapist   Medication Refill    Does not know what insulin dose she should be on?   Diabetes   Subjective    Ashley Camacho is a 32 y.o. female who presents today as a new patient to establish care.  HPI HPI     Referral    Additional comments: Endo and Cardio and therapist        Medication Refill    Additional comments: Does not know what insulin dose she should be on?        Comments   Since moving to [place] needs cardio referral and endocrinologist and currently has GI and see's [facility]      Last edited by Marcos Eke, CMA on 05/10/2023 10:24 AM.      *** Discussed the use of AI scribe software for clinical note transcription with the patient, who gave verbal consent to proceed.  History of Present Illness            Past Medical History:  Diagnosis Date   Acute metabolic encephalopathy 06/10/2022   Adrenal insufficiency (HCC)    Anemia    CHF (congestive heart failure) (HCC)    Gastroparesis    Hypertension    Hypotension    Intractable nausea and vomiting 11/13/2022   Migraine    Thyroid disease    Type 1 diabetes (HCC)    Past Surgical History:  Procedure Laterality Date   BIOPSY  01/14/2021   Procedure: BIOPSY;  Surgeon: Tressia Danas, MD;  Location: [facility] ENDOSCOPY;  Service: Gastroenterology;;   CESAREAN SECTION     x2   CESAREAN SECTION WITH BILATERAL TUBAL LIGATION N/A 02/17/2021   Procedure: CESAREAN SECTION WITH BILATERAL TUBAL LIGATION;  Surgeon: [name], DO;  Location: [facility] LD ORS;  Service: Obstetrics;  Laterality: N/A;   ESOPHAGOGASTRODUODENOSCOPY (EGD) WITH PROPOFOL N/A 01/14/2021   Procedure: ESOPHAGOGASTRODUODENOSCOPY (EGD) WITH PROPOFOL;   Surgeon: Tressia Danas, MD;  Location: [facility] ENDOSCOPY;  Service: Gastroenterology;  Laterality: N/A;   IR BONE MARROW BIOPSY & ASPIRATION  12/16/2022   IR REPLC DUODEN/JEJUNO TUBE PERCUT W/FLUORO  11/22/2022   JEJUNOSTOMY N/A 06/13/2022   Procedure: Rance Muir;  Surgeon: Leafy Ro, MD;  Location: [facility] ORS;  Service: General;  Laterality: N/A;   MOUTH SURGERY     Family Status  Relation Name Status   Mother  Alive   Father  Deceased   MGF  Deceased   PGM  (Not Specified)   PGF  (Not Specified)   Neg Hx  (Not Specified)  No partnership data on file   Family History  Problem Relation Age of Onset   Breast cancer Mother    Seizures Mother    Diabetes type I Father    CAD Father    Lung cancer Maternal Grandfather    CAD Paternal Grandmother    CAD Paternal Grandfather    Ovarian cancer Neg Hx    Social History   Socioeconomic History   Marital status: Single    Spouse name: Not on file   Number of children: Not on file   Years of education: Not on file   Highest education level: 9th grade  Occupational History  Occupation: home maker  Tobacco Use   Smoking status: Never    Passive exposure: Never   Smokeless tobacco: Never  Vaping Use   Vaping status: Never Used  Substance and Sexual Activity   Alcohol use: No   Drug use: No   Sexual activity: Not Currently    Birth control/protection: None  Other Topics Concern   Not on file  Social History Narrative   Not on file   Social Drivers of Health   Financial Resource Strain: High Risk (05/09/2023)   Overall Financial Resource Strain (CARDIA)    Difficulty of Paying Living Expenses: Very hard  Food Insecurity: No Food Insecurity (05/09/2023)   Hunger Vital Sign    Worried About Running Out of Food in the Last Year: Never true    Ran Out of Food in the Last Year: Never true  Transportation Needs: Unmet Transportation Needs (05/09/2023)   PRAPARE - Administrator, Civil Service (Medical): Yes     Lack of Transportation (Non-Medical): No  Physical Activity: Unknown (05/09/2023)   Exercise Vital Sign    Days of Exercise per Week: 0 days    Minutes of Exercise per Session: Not on file  Stress: Stress Concern Present (05/09/2023)   [employer] of Occupational Health - Occupational Stress Questionnaire    Feeling of Stress : Very much  Social Connections: Socially Isolated (05/09/2023)   Social Connection and Isolation Panel [NHANES]    Frequency of Communication with Friends and Family: Once a week    Frequency of Social Gatherings with Friends and Family: More than three times a week    Attends Religious Services: Never    Database administrator or Organizations: No    Attends Engineer, structural: Not on file    Marital Status: Never married   Outpatient Medications Prior to Visit  Medication Sig   acetaminophen (TYLENOL) 500 MG tablet Take 2 tablets (1,000 mg total) by mouth every 6 (six) hours as needed for headache, fever or moderate pain.   divalproex (DEPAKOTE ER) 250 MG 24 hr tablet Take 1 tablet (250 mg total) by mouth daily.   hydrocortisone (CORTEF) 10 MG tablet Take 2 tablets (20 mg total) by mouth daily with breakfast AND 1 tablet (10 mg total) daily with supper.   Insulin Glargine (BASAGLAR KWIKPEN) 100 UNIT/ML Inject 7 Units into the skin daily.   levothyroxine (SYNTHROID) 75 MCG tablet Take 1 tablet (75 mcg total) by mouth daily at 6 (six) AM.   loperamide (IMODIUM) 2 MG capsule Take 2 capsules (4 mg total) by mouth as needed for diarrhea or loose stools.   Nutritional Supplements (FEEDING SUPPLEMENT, GLUCERNA 1.5 CAL,) LIQD Place 640 mLs into feeding tube at bedtime.   promethazine (PHENERGAN) 25 MG tablet Take 0.5 tablets (12.5 mg total) by mouth every 8 (eight) hours as needed for nausea or vomiting.   insulin aspart (NOVOLOG) 100 UNIT/ML injection Inject 2 Units into the skin 3 (three) times daily with meals.   [DISCONTINUED] FLUoxetine (PROZAC)  10 MG capsule Take 1 capsule (10 mg total) by mouth daily.   [DISCONTINUED] insulin aspart (NOVOLOG) 100 UNIT/ML injection Inject 0-6 Units into the skin 4 (four) times daily - after meals and at bedtime.   [DISCONTINUED] lipase/protease/amylase 24000-76000 units CPEP Take 1 capsule (24,000 Units total) by mouth 3 (three) times daily before meals.   [DISCONTINUED] ondansetron (ZOFRAN-ODT) 4 MG disintegrating tablet Take 1 tablet (4 mg total) by mouth every 8 (eight) hours  as needed for nausea or vomiting.   No facility-administered medications prior to visit.   No Known Allergies  Immunization History  Administered Date(s) Administered   H1N1 06/25/2008   Influenza Inj Mdck Quad Pf 07/01/2020   Influenza, Seasonal, Injecte, Preservative Fre 02/17/2023   Influenza,inj,Quad PF,6+ Mos 04/14/2012, 02/22/2014, 02/21/2021   Influenza-Unspecified 01/23/2016, 04/20/2016   Pneumococcal Polysaccharide-23 04/02/2010, 02/22/2021   Tdap 12/18/2010, 01/08/2021    Health Maintenance  Topic Date Due   COVID-19 Vaccine (1) Never done   FOOT EXAM  Never done   OPHTHALMOLOGY EXAM  Never done   Diabetic kidney evaluation - Urine ACR  01/10/2022   Pneumococcal Vaccine 64-26 Years old (3 of 3 - PCV) 02/22/2022   HEMOGLOBIN A1C  05/04/2023   Cervical Cancer Screening (HPV/Pap Cotest)  09/26/2023   Diabetic kidney evaluation - eGFR measurement  04/07/2024   DTaP/Tdap/Td (3 - Td or Tdap) 01/09/2031   INFLUENZA VACCINE  Completed   Hepatitis C Screening  Completed   HIV Screening  Completed   HPV VACCINES  Aged Out    Patient Care Team: Debera Lat, PA-C as PCP - General (Physician Assistant) Croitoru, Rachelle Hora, MD as PCP - Cardiology (Cardiology) Leafy Ro, MD as Consulting Physician (General Surgery) Rickard Patience, MD as Consulting Physician (Oncology)  Review of Systems Except see HPI   {Insert previous labs (optional):23779} {See past labs  Heme  Chem  Endocrine  Serology  Results  Review (optional):1}   Objective    BP (!) 98/59   Pulse 100   Temp 97.7 F (36.5 C)   Resp 14   Ht 5\' 1"  (1.549 m)   Wt 92 lb 1.6 oz (41.8 kg)   SpO2 94%   BMI 17.40 kg/m  {Insert last BP/Wt (optional):23777}{See vitals history (optional):1}   Physical Exam  Depression Screen    05/10/2023   10:25 AM 11/11/2020    4:49 PM  PHQ 2/9 Scores  PHQ - 2 Score 5 0  PHQ- 9 Score 23    No results found for any visits on 05/10/23.  Assessment & Plan     *** Assessment and Plan              Encounter to establish care Welcomed to our clinic Reviewed past medical hx, social hx, family hx and surgical hx Pt advised to send all vaccination records or screening   No follow-ups on file.    The patient was advised to call back or seek an in-person evaluation if the symptoms worsen or if the condition fails to improve as anticipated.  I discussed the assessment and treatment plan with the patient. The patient was provided an opportunity to ask questions and all were answered. The patient agreed with the plan and demonstrated an understanding of the instructions.  I, Debera Lat, PA-C have reviewed all documentation for this visit. The documentation on  05/10/2023   for the exam, diagnosis, procedures, and orders are all accurate and complete.  Debera Lat, [facility], MMS [facility] [phone] (phone) [phone] (fax)  [facility]

## 2023-05-11 ENCOUNTER — Emergency Department
Admission: EM | Admit: 2023-05-11 | Discharge: 2023-05-11 | Disposition: A | Discharge status: Home / Self Care | Payer: Medicaid Other | Attending: Emergency Medicine | Admitting: Emergency Medicine

## 2023-05-11 ENCOUNTER — Other Ambulatory Visit: Payer: Self-pay

## 2023-05-11 ENCOUNTER — Telehealth: Payer: Self-pay

## 2023-05-11 DIAGNOSIS — D649 Anemia, unspecified: Secondary | ICD-10-CM | POA: Insufficient documentation

## 2023-05-11 DIAGNOSIS — R739 Hyperglycemia, unspecified: Secondary | ICD-10-CM | POA: Diagnosis not present

## 2023-05-11 DIAGNOSIS — E1165 Type 2 diabetes mellitus with hyperglycemia: Secondary | ICD-10-CM | POA: Diagnosis not present

## 2023-05-11 LAB — CBC WITH DIFFERENTIAL/PLATELET
Abs Immature Granulocytes: 0 10*3/uL (ref 0.00–0.07)
Basophils Absolute: 0 10*3/uL (ref 0.0–0.2)
Basophils Absolute: 0.1 10*3/uL (ref 0.0–0.1)
Basophils Relative: 1 %
Basos: 1 %
EOS (ABSOLUTE): 0.2 10*3/uL (ref 0.0–0.4)
Eos: 4 %
Eosinophils Absolute: 0.2 10*3/uL (ref 0.0–0.5)
Eosinophils Relative: 4 %
HCT: 23.8 % — ABNORMAL LOW (ref 36.0–46.0)
Hematocrit: 22.6 % — ABNORMAL LOW (ref 34.0–46.6)
Hemoglobin: 7.1 g/dL — ABNORMAL LOW (ref 11.1–15.9)
Hemoglobin: 8.3 g/dL — ABNORMAL LOW (ref 12.0–15.0)
Immature Grans (Abs): 0 10*3/uL (ref 0.0–0.1)
Immature Granulocytes: 0 %
Immature Granulocytes: 0 %
Lymphocytes Absolute: 1.2 10*3/uL (ref 0.7–3.1)
Lymphocytes Relative: 40 %
Lymphs Abs: 1.4 10*3/uL (ref 0.7–4.0)
Lymphs: 34 %
MCH: 29.1 pg (ref 26.6–33.0)
MCH: 29.6 pg (ref 26.0–34.0)
MCHC: 31.4 g/dL — ABNORMAL LOW (ref 31.5–35.7)
MCHC: 34.9 g/dL (ref 30.0–36.0)
MCV: 93 fL (ref 79–97)
MCV: 85 fL (ref 80.0–100.0)
Monocytes Absolute: 0.2 10*3/uL (ref 0.1–0.9)
Monocytes Absolute: 0.2 10*3/uL (ref 0.1–1.0)
Monocytes Relative: 6 %
Monocytes: 6 %
Neutro Abs: 1.7 10*3/uL (ref 1.7–7.7)
Neutrophils Absolute: 1.9 10*3/uL (ref 1.4–7.0)
Neutrophils Relative %: 49 %
Neutrophils: 55 %
Platelets: 127 10*3/uL — ABNORMAL LOW (ref 150–450)
Platelets: 129 10*3/uL — ABNORMAL LOW (ref 150–400)
RBC: 2.44 x10E6/uL — CL (ref 3.77–5.28)
RBC: 2.8 MIL/uL — ABNORMAL LOW (ref 3.87–5.11)
RDW: 13.8 % (ref 11.7–15.4)
RDW: 13.5 % (ref 11.5–15.5)
WBC: 3.4 10*3/uL (ref 3.4–10.8)
WBC: 3.5 10*3/uL — ABNORMAL LOW (ref 4.0–10.5)
nRBC: 0 % (ref 0.0–0.2)

## 2023-05-11 LAB — LIPID PANEL
Chol/HDL Ratio: 3 {ratio} (ref 0.0–4.4)
Cholesterol, Total: 133 mg/dL (ref 100–199)
HDL: 45 mg/dL (ref >39)
LDL Chol Calc (NIH): 49 mg/dL (ref 0–99)
Triglycerides: 249 mg/dL — ABNORMAL HIGH (ref 0–149)
VLDL Cholesterol Cal: 39 mg/dL (ref 5–40)

## 2023-05-11 LAB — COMPREHENSIVE METABOLIC PANEL
ALT: 19 [IU]/L (ref 0–32)
ALT: 23 U/L (ref 0–44)
AST: 21 [IU]/L (ref 0–40)
AST: 26 U/L (ref 15–41)
Albumin: 4 g/dL (ref 3.9–4.9)
Albumin: 4.2 g/dL (ref 3.5–5.0)
Alkaline Phosphatase: 64 U/L (ref 38–126)
Anion gap: 11 (ref 5–15)
BUN/Creatinine Ratio: 13 (ref 9–23)
BUN: 24 mg/dL — ABNORMAL HIGH (ref 6–20)
BUN: 28 mg/dL — ABNORMAL HIGH (ref 6–20)
Bilirubin Total: 0.3 mg/dL (ref 0.0–1.2)
CO2: 20 mmol/L (ref 20–29)
CO2: 20 mmol/L — ABNORMAL LOW (ref 22–32)
Calcium: 8.1 mg/dL — ABNORMAL LOW (ref 8.7–10.2)
Calcium: 8.9 mg/dL (ref 8.9–10.3)
Chloride: 92 mmol/L — ABNORMAL LOW (ref 96–106)
Chloride: 99 mmol/L (ref 98–111)
Creatinine, Ser: 1.92 mg/dL — ABNORMAL HIGH (ref 0.57–1.00)
Creatinine, Ser: 1.73 mg/dL — ABNORMAL HIGH (ref 0.44–1.00)
GFR, Estimated: 40 mL/min — ABNORMAL LOW (ref >60)
Globulin, Total: 2.3 g/dL (ref 1.5–4.5)
Glucose, Bld: 513 mg/dL (ref 70–99)
Potassium: 5.6 mmol/L — ABNORMAL HIGH (ref 3.5–5.1)
Sodium: 129 mmol/L — ABNORMAL LOW (ref 134–144)
Sodium: 130 mmol/L — ABNORMAL LOW (ref 135–145)
Total Bilirubin: 1.1 mg/dL (ref <1.2)
Total Protein: 6.3 g/dL (ref 6.0–8.5)
Total Protein: 6.9 g/dL (ref 6.5–8.1)
eGFR: 35 mL/min/{1.73_m2} — ABNORMAL LOW (ref >59)

## 2023-05-11 LAB — MICROALBUMIN / CREATININE URINE RATIO
Creatinine, Urine: 58.8 mg/dL
Microalb/Creat Ratio: 22 mg/g{creat} (ref 0–29)
Microalbumin, Urine: 12.8 ug/mL

## 2023-05-11 LAB — T4, FREE: Free T4: 0.63 ng/dL — ABNORMAL LOW (ref 0.82–1.77)

## 2023-05-11 LAB — CBG MONITORING, ED
Glucose-Capillary: 449 mg/dL — ABNORMAL HIGH (ref 70–99)
Glucose-Capillary: 398 mg/dL — ABNORMAL HIGH (ref 70–99)

## 2023-05-11 LAB — TYPE AND SCREEN
ABO/RH(D): A POS
Antibody Screen: NEGATIVE

## 2023-05-11 LAB — TSH: TSH: 5.56 u[IU]/mL — ABNORMAL HIGH (ref 0.450–4.500)

## 2023-05-11 LAB — HEMOGLOBIN A1C
Est. average glucose Bld gHb Est-mCnc: 249 mg/dL
Hgb A1c MFr Bld: 10.3 % — ABNORMAL HIGH (ref 4.8–5.6)

## 2023-05-11 MED ORDER — LACTATED RINGERS IV BOLUS
1000.0000 mL | Freq: Once | INTRAVENOUS | Status: AC
  Administered 2023-05-11: 1000 mL via INTRAVENOUS

## 2023-05-11 MED ORDER — SODIUM CHLORIDE 0.9 % IV BOLUS
1000.0000 mL | Freq: Once | INTRAVENOUS | Status: AC
  Administered 2023-05-11: 1000 mL via INTRAVENOUS

## 2023-05-11 MED ORDER — INSULIN ASPART 100 UNIT/ML IJ SOLN
5.0000 [IU] | Freq: Once | INTRAMUSCULAR | Status: AC
  Administered 2023-05-11: 5 [IU] via INTRAVENOUS
  Filled 2023-05-11: qty 1

## 2023-05-11 NOTE — Telephone Encounter (Signed)
Left CRM for [facility] to put call through to office and not let patient off phone without speaking to the office

## 2023-05-11 NOTE — Telephone Encounter (Signed)
Patient has just called and she has gotten care for her child and is heading over to the hospital now.

## 2023-05-11 NOTE — Telephone Encounter (Signed)
I just spoke with the patient and explained the urgency of the recommendations.  She is currently alone with her 32 year old child.  She will try to make arrangements for child care.  She has been asked to call me back and let me know when she has made arrangements and will go to the hospital.  I have also asked her to call me if she can't make arrangements for her child and I will see what arrangements can be made for someone to help her with the child while at the hospital.

## 2023-05-11 NOTE — ED Provider Notes (Signed)
[facility] Provider Note    Event Date/Time   First MD Initiated Contact with Patient 05/11/23 1506     (approximate)   History   Abnormal Labs   HPI  Ashley Camacho is a 32 y.o. female who presents to the emergency department today at the advice of her primary care doctor after outpatient labs yesterday showed anemia.  Patient states that she has a history of anemia.  She does follow-up with hematology for this.  States that they recommended an infusion although is unsure what medication it was to help with her blood levels.  She states that she does get occasionally dizzy with this and has been dizzy at times the past couple days although states is not extreme.  This was a scheduled visit with her primary care.  Apparently blood work yesterday showed hemoglobin of 7.1.     Physical Exam   Triage Vital Signs: ED Triage Vitals [05/11/23 1209]  Encounter Vitals Group     BP (!) 87/60     Systolic BP Percentile      Diastolic BP Percentile      Pulse Rate 100     Resp 16     Temp 98.5 F (36.9 C)     Temp Source Oral     SpO2 100 %     Weight 92 lb (41.7 kg)     Height 5\' 1"  (1.549 m)     Head Circumference      Peak Flow      Pain Score 0     Pain Loc      Pain Education      Exclude from Growth Chart     Most recent vital signs: Vitals:   05/11/23 1450 05/11/23 1500  BP: 108/89 119/62  Pulse: 76 73  Resp:    Temp:    SpO2: 100% 100%   General: Awake, alert, oriented. CV:  Good peripheral perfusion. Regular rate and rhythm. Resp:  Normal effort. Lungs clear. Abd:  No distention.   ED Results / Procedures / Treatments   Labs (all labs ordered are listed, but only abnormal results are displayed) Labs Reviewed  CBC WITH DIFFERENTIAL/PLATELET - Abnormal; Notable for the following components:      Result Value   WBC 3.5 (*)    RBC 2.80 (*)    Hemoglobin 8.3 (*)    HCT 23.8 (*)    Platelets 129 (*)    All other components  within normal limits  COMPREHENSIVE METABOLIC PANEL - Abnormal; Notable for the following components:   Sodium 130 (*)    Potassium 5.6 (*)    CO2 20 (*)    Glucose, Bld 513 (*)    BUN 28 (*)    Creatinine, Ser 1.73 (*)    GFR, Estimated 40 (*)    All other components within normal limits  TYPE AND SCREEN     EKG  None   RADIOLOGY None   PROCEDURES:  Critical Care performed: No   MEDICATIONS ORDERED IN ED: Medications  sodium chloride 0.9 % bolus 1,000 mL (has no administration in time range)  lactated ringers bolus 1,000 mL (1,000 mLs Intravenous New Bag/Given 05/11/23 1446)     IMPRESSION / MDM / ASSESSMENT AND PLAN / ED COURSE  I reviewed the triage vital signs and the nursing notes.  Differential diagnosis includes, but is not limited to, anemia, electrolyte abnormality, dehydration  Patient's presentation is most consistent with acute presentation with potential threat to life or bodily function.   Patient presented to the emergency department today because of concerns for anemia seen on blood work checked yesterday.  Blood work here in the emergency department does show an improved hemoglobin level and hemoglobin level more consistent with patient's baseline.  Did however show hyperglycemia.  Patient states that her blood sugar is hard to control.  No anion gap to suggest DKA.  At this time do not feel patient needs transfusion of red blood cells.  Will give IV fluids and insulin to help bring down her blood sugar.  I do think patient can follow-up with her hematologist for the continued anemia.      FINAL CLINICAL IMPRESSION(S) / ED DIAGNOSES   Final diagnoses:  Anemia, unspecified type  Hyperglycemia      Note:  This document was prepared using Dragon voice recognition software and may include unintentional dictation errors.    Phineas Semen, MD 05/11/23 [phone]

## 2023-05-11 NOTE — ED Notes (Signed)
ERP informed of pt stating she is having flashing lights in bilateral eyes. See orders.

## 2023-05-11 NOTE — ED Triage Notes (Addendum)
Pt presents to ED with c/o of having abnormal labs at PCP for routine visit, pt states her HgB "is low". Pt does appear pallor and states HX of anemia and sees hematology for this. Pt is A&Ox4. NAD noted.   Pts hypotensive in triage and also states some dizziness.  HgB at PCP was 7.1 yesterday.

## 2023-05-11 NOTE — ED Provider Triage Note (Signed)
Emergency Medicine Provider Triage Evaluation Note  Ashley Camacho , a 32 y.o. female  was evaluated in triage.  Pt complains of dizziness, but reports she always feels dizzy. History of anemia, requiring transfusions, reports hematologist unsure why. No blood thinners. Reports on her period now but very light. No bloody stool. Had HgB 7.1 yesterday.  Review of Systems  Positive: dizziness Negative: pain  Physical Exam  BP (!) 87/60 (BP Location: Right Arm)   Pulse 100   Temp 98.5 F (36.9 C) (Oral)   Resp 16   Ht 5\' 1"  (1.549 m)   Wt 41.7 kg   LMP 05/11/2023   SpO2 100%   BMI 17.38 kg/m  Gen:   Awake, no distress   Resp:  Normal effort  MSK:   Moves extremities without difficulty  Other:    Medical Decision Making  Medically screening exam initiated at 12:13 PM.  Appropriate orders placed.  Ashley Camacho was informed that the remainder of the evaluation will be completed by another provider, this initial triage assessment does not replace that evaluation, and the importance of remaining in the ED until their evaluation is complete.     Jackelyn Hoehn, PA-C 05/11/23 1215

## 2023-05-11 NOTE — Progress Notes (Signed)
Please, advise this pt to immediately proceed to ED as her RBC is 2.44 and her hemoglobin level of 7.1 Immediate blood transfusion and evaluation for the cause of anemia are recommended, alongside managing her adrenal insufficiency.

## 2023-05-11 NOTE — ED Notes (Signed)
MD Mumma informed of glucose of 513

## 2023-05-11 NOTE — Telephone Encounter (Signed)
Voice message left on patient's phone and her mother's  (emergency contact) phone for patient to call office and have call go directly through to the office and ask for [facility].  Spoke with other emergency contact [facility] and asked him to contact patient and give her the same message.  He states he should be able to get in touch with her.

## 2023-05-24 NOTE — Progress Notes (Signed)
 Cardiology Clinic Note   Date: 05/26/2023 ID: Laurrie, Toppin [birth year], MRN [id number]  Primary Cardiologist:  Jerel Balding, MD  Patient Profile    Ashley Camacho is a 32 y.o. female who presents to the clinic today for cardiomyopathy follow up.     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 Echo 01/03/2022 (performed at [facility]): EF 30-35%.  Severe LV dysfunction.  Mild RV dysfunction.  No valvular stenosis.  Small pericardial effusion.  Mild to moderate MR.  No significant change from 08/01/2021. Hypertension. DVT right upper extremity. T1DM. Gastroparesis. Hypothyroidism. CKD stage IIIb.  In summary, patient was evaluated by cardiology for CHF during hospital admission in July 2022.  Patient had several hospital admissions and ED visits in the 3 months prior predominantly for hyperemesis in pregnancy.  In June 2022 she presented to [facility] with the inability to tolerate anything by mouth secondary to hyperemesis gravidarum.  She was found to have acute on chronic anemia with hemoglobin down to 6.1 requiring PRBC transfusion.  She was seen by heme-onc and IV iron  was recommended.  She subsequently developed fever and leukocytosis consistent with sepsis secondary to multifocal pneumonia.  She was transferred to [facility] for further evaluation.  She completed a 10-day course of ceftriaxone  and azithromycin .  On 11/25/2020 she underwent thoracentesis.  Echo on 11/26/2020 showed EF 45% and was diuresed with IV Lasix .  Her hospital course was further complicated by right brachial vein DVT and she was discharged home on Lovenox .  She returned to [facility] on 12/12/2020 with nausea and vomiting.  She was started on TPN and plan was made for transpyloric core track tube placement at [facility].  Chest x-ray showed pulmonary edema with developing bilateral effusions.  Echo showed EF 40 to 45%, global hypokinesis, mild RV  enlargement, mild RVE/RAE, small pericardial effusion, mild to moderate MR.  She reported father died of MI in his 52s but no other history of CAD.  An echo August 2022 showed EF 50 to 55%.  Postpartum echo October 2022 showed EF 50 to 55%.  Global hypokinesis.  Normal diastolic parameters.  Normal RV size/function.  Normal PA pressure, RVSP 35.7 mmHg, small pericardial effusion without evidence of cardiac tamponade, moderate MR trivial AI.  Patient was followed by [facility] health cardiology in 2023.  Last echo in August 2023 showed EF 30 to 35% (further details above).  It does not appear she has received cardiology care since the summer 2023.  Patient with multiple hospital admissions and ED visits throughout 2024 related to type 1 diabetes.  Notably patient underwent hospitalization from 1/18-2/16 for multiple issues including DKA, gastroparesis, malnutrition, depression.  Last ED visit on 05/11/2023 for hemoglobin of 7.1 sent by PCP.  Repeat labs in ED showed improved hemoglobin closer to baseline at 8.3.  Glucose 513.  Patient received IV fluids and insulin  to bring sugar down.  Last glucose check 398.  Patient was discharged.     History of Present Illness   Today, patient is accompanied by her mother. She has not been seen in the outpatient setting by our practice. She was last seen by Dr. Balding in the hospital in July 2022 as detailed above. She has poorly controlled type I diabetes for which she has had multiple hospital admissions. Her main complaint today is dizziness and palpitations. She describes palpitations as fast pounding with occasional fluttering feeling. Her BP tends to run low. She is positive  for orthostatic hypotension today. She reports she had to walk more than she usually does so she feels dizziness is worse than normal. FSBS 356 today. She reports adequately hydrating. She has a feeding tube. Discussed going to ED today. Patient reports they don't do anything when I am  there. She admits she is at baseline for her and agrees to manage symptoms at home.      ROS: All other systems reviewed and are otherwise negative except as noted in History of Present Illness.  EKGs/Labs Reviewed    EKG Interpretation Date/Time:  Thursday May 26 2023 10:53:45 EST Ventricular Rate:  84 PR Interval:  158 QRS Duration:  78 QT Interval:  394 QTC Calculation: 465 R Axis:   19  Text Interpretation: Normal sinus rhythm Nonspecific T wave abnormality When compared with ECG of 06-Apr-2023 17:02, T wave inversion no longer evident in Inferior leads T wave inversion no longer evident in Anterior leads Confirmed by Loistine Sober [id number]) on 05/26/2023 11:03:58 AM   05/11/2023: ALT 23; AST 26; BUN 28; Creatinine, Ser 1.73; Potassium 5.6; Sodium 130   05/11/2023: Hemoglobin 8.3; WBC 3.5   05/10/2023: TSH 5.560       Physical Exam    VS:  BP 90/60 (BP Location: Left Arm, Patient Position: Sitting, Cuff Size: Normal)   Pulse 84   Ht 5' 1 (1.549 m)   Wt 91 lb 2 oz (41.3 kg)   LMP 05/11/2023   BMI 17.22 kg/m  , BMI Body mass index is 17.22 kg/m.  GEN: Thin, pale, ill-appearing, in no acute distress. Neck: No JVD or carotid bruits. Cardiac:  RRR. No murmurs. No rubs or gallops.   Respiratory:  Respirations regular and unlabored. Clear to auscultation without rales, wheezing or rhonchi. GI: Soft, nontender, nondistended. Extremities: Radials/DP/PT 2+ and equal bilaterally. No clubbing or cyanosis. No edema.  Skin: Warm and dry, no rash. Neuro: Strength intact.  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 Patient has not received cardiac care since the summer of 2023. She denies recent lower extremity edema. She reports dyspnea with minimal exertion.  -Schedule echo.  Palpitations/Chronic  anemia Patient reports palpitations described as fast pounding with occasional fluttering. This typically occurs with exertion and resolves with rest. She states there are days that the pounding lasts all day. She is unsure if palpitations are worse on days she does not eat or drink as much. She is quite pale today. She reports this is her baseline.  -CBC, BMP, Mg today.   Hypotension/Orthostasis BP today 87/60 on intake and 90/60 on recheck. Orthostasis with BP 94/72 lying down and 78/54 sitting up. She could not stand secondary to dizziness. Patient reports she hydrates adequately. Discussed going to ED for IV fluids. She reports they don't do anything when I am there. She would like to go home to push fluids and start medication and I think this is reasonable. ED precautions provided.  -Labs as above.  -Start midodrine  2.5 mg tid for SBP <100.  -Provide rx for BP cuff to monitor BP at home.   T1DM Patient with multiple hospital admissions for poorly controlled diabetes. FSBS today 356. PCP is currently managing her diabetes. She has been referred to endocrinology and is trying to get in with them.  -Continue to follow with PCP.  Disposition: Echo. CBC, BMP, Mg today. Return  in 3-4 weeks for MD visit to establish with one of our cardiologists.   **Unfortunately, patient began vomiting prior to being discharged. She will be taken to ED for further management.**          Signed, Barnie HERO. Bertram Haddix, DNP, NP-C

## 2023-05-25 DIAGNOSIS — Z419 Encounter for procedure for purposes other than remedying health state, unspecified: Secondary | ICD-10-CM | POA: Diagnosis not present

## 2023-05-26 ENCOUNTER — Ambulatory Visit: Payer: Medicaid Other | Attending: Student | Admitting: Student

## 2023-05-26 ENCOUNTER — Other Ambulatory Visit: Payer: Self-pay

## 2023-05-26 ENCOUNTER — Encounter: Payer: Self-pay | Admitting: Internal Medicine

## 2023-05-26 ENCOUNTER — Encounter: Payer: Self-pay | Admitting: Student

## 2023-05-26 ENCOUNTER — Encounter: Payer: Self-pay | Admitting: Emergency Medicine

## 2023-05-26 ENCOUNTER — Emergency Department: Payer: Medicaid Other

## 2023-05-26 ENCOUNTER — Inpatient Hospital Stay
Admission: EM | Admit: 2023-05-26 | Discharge: 2023-05-27 | DRG: 314 | Disposition: A | Discharge status: Home / Self Care | Payer: Medicaid Other | Source: Ambulatory Visit | Attending: Internal Medicine | Admitting: Internal Medicine

## 2023-05-26 VITALS — BP 90/60 | HR 84 | Ht 61.0 in | Wt 91.1 lb

## 2023-05-26 DIAGNOSIS — N1832 Chronic kidney disease, stage 3b: Secondary | ICD-10-CM | POA: Diagnosis not present

## 2023-05-26 DIAGNOSIS — F332 Major depressive disorder, recurrent severe without psychotic features: Secondary | ICD-10-CM | POA: Diagnosis present

## 2023-05-26 DIAGNOSIS — Z79890 Hormone replacement therapy (postmenopausal): Secondary | ICD-10-CM

## 2023-05-26 DIAGNOSIS — F331 Major depressive disorder, recurrent, moderate: Secondary | ICD-10-CM | POA: Diagnosis present

## 2023-05-26 DIAGNOSIS — E872 Acidosis, unspecified: Secondary | ICD-10-CM | POA: Diagnosis present

## 2023-05-26 DIAGNOSIS — R531 Weakness: Secondary | ICD-10-CM | POA: Diagnosis not present

## 2023-05-26 DIAGNOSIS — D631 Anemia in chronic kidney disease: Secondary | ICD-10-CM | POA: Diagnosis not present

## 2023-05-26 DIAGNOSIS — O903 Peripartum cardiomyopathy: Secondary | ICD-10-CM

## 2023-05-26 DIAGNOSIS — I951 Orthostatic hypotension: Secondary | ICD-10-CM | POA: Diagnosis not present

## 2023-05-26 DIAGNOSIS — I5042 Chronic combined systolic (congestive) and diastolic (congestive) heart failure: Secondary | ICD-10-CM | POA: Diagnosis not present

## 2023-05-26 DIAGNOSIS — E2740 Unspecified adrenocortical insufficiency: Secondary | ICD-10-CM | POA: Diagnosis not present

## 2023-05-26 DIAGNOSIS — E860 Dehydration: Secondary | ICD-10-CM | POA: Diagnosis not present

## 2023-05-26 DIAGNOSIS — T380X6A Underdosing of glucocorticoids and synthetic analogues, initial encounter: Secondary | ICD-10-CM | POA: Diagnosis present

## 2023-05-26 DIAGNOSIS — I420 Dilated cardiomyopathy: Secondary | ICD-10-CM | POA: Diagnosis not present

## 2023-05-26 DIAGNOSIS — Z681 Body mass index (BMI) 19 or less, adult: Secondary | ICD-10-CM

## 2023-05-26 DIAGNOSIS — E109 Type 1 diabetes mellitus without complications: Secondary | ICD-10-CM | POA: Diagnosis present

## 2023-05-26 DIAGNOSIS — R42 Dizziness and giddiness: Secondary | ICD-10-CM | POA: Diagnosis not present

## 2023-05-26 DIAGNOSIS — E108 Type 1 diabetes mellitus with unspecified complications: Secondary | ICD-10-CM

## 2023-05-26 DIAGNOSIS — F419 Anxiety disorder, unspecified: Secondary | ICD-10-CM | POA: Diagnosis present

## 2023-05-26 DIAGNOSIS — Z9851 Tubal ligation status: Secondary | ICD-10-CM

## 2023-05-26 DIAGNOSIS — Z794 Long term (current) use of insulin: Secondary | ICD-10-CM

## 2023-05-26 DIAGNOSIS — K3184 Gastroparesis: Secondary | ICD-10-CM | POA: Diagnosis present

## 2023-05-26 DIAGNOSIS — I10 Essential (primary) hypertension: Secondary | ICD-10-CM | POA: Diagnosis present

## 2023-05-26 DIAGNOSIS — Z91148 Patient's other noncompliance with medication regimen for other reason: Secondary | ICD-10-CM

## 2023-05-26 DIAGNOSIS — Z833 Family history of diabetes mellitus: Secondary | ICD-10-CM

## 2023-05-26 DIAGNOSIS — I959 Hypotension, unspecified: Secondary | ICD-10-CM

## 2023-05-26 DIAGNOSIS — D649 Anemia, unspecified: Secondary | ICD-10-CM

## 2023-05-26 DIAGNOSIS — E039 Hypothyroidism, unspecified: Secondary | ICD-10-CM | POA: Diagnosis not present

## 2023-05-26 DIAGNOSIS — I5022 Chronic systolic (congestive) heart failure: Secondary | ICD-10-CM | POA: Diagnosis present

## 2023-05-26 DIAGNOSIS — Z98891 History of uterine scar from previous surgery: Secondary | ICD-10-CM

## 2023-05-26 DIAGNOSIS — Z803 Family history of malignant neoplasm of breast: Secondary | ICD-10-CM

## 2023-05-26 DIAGNOSIS — E43 Unspecified severe protein-calorie malnutrition: Secondary | ICD-10-CM | POA: Diagnosis not present

## 2023-05-26 DIAGNOSIS — R002 Palpitations: Secondary | ICD-10-CM

## 2023-05-26 DIAGNOSIS — Z931 Gastrostomy status: Secondary | ICD-10-CM

## 2023-05-26 DIAGNOSIS — Z86718 Personal history of other venous thrombosis and embolism: Secondary | ICD-10-CM

## 2023-05-26 DIAGNOSIS — N189 Chronic kidney disease, unspecified: Secondary | ICD-10-CM | POA: Diagnosis present

## 2023-05-26 DIAGNOSIS — Z1152 Encounter for screening for COVID-19: Secondary | ICD-10-CM

## 2023-05-26 DIAGNOSIS — Z7189 Other specified counseling: Secondary | ICD-10-CM

## 2023-05-26 DIAGNOSIS — R627 Adult failure to thrive: Secondary | ICD-10-CM | POA: Diagnosis present

## 2023-05-26 DIAGNOSIS — I130 Hypertensive heart and chronic kidney disease with heart failure and stage 1 through stage 4 chronic kidney disease, or unspecified chronic kidney disease: Secondary | ICD-10-CM | POA: Diagnosis present

## 2023-05-26 DIAGNOSIS — I9589 Other hypotension: Principal | ICD-10-CM | POA: Diagnosis present

## 2023-05-26 DIAGNOSIS — E1043 Type 1 diabetes mellitus with diabetic autonomic (poly)neuropathy: Secondary | ICD-10-CM | POA: Diagnosis not present

## 2023-05-26 DIAGNOSIS — E1022 Type 1 diabetes mellitus with diabetic chronic kidney disease: Secondary | ICD-10-CM | POA: Diagnosis present

## 2023-05-26 DIAGNOSIS — R110 Nausea: Secondary | ICD-10-CM | POA: Diagnosis not present

## 2023-05-26 DIAGNOSIS — Z801 Family history of malignant neoplasm of trachea, bronchus and lung: Secondary | ICD-10-CM

## 2023-05-26 DIAGNOSIS — I5020 Unspecified systolic (congestive) heart failure: Secondary | ICD-10-CM | POA: Diagnosis present

## 2023-05-26 DIAGNOSIS — E1069 Type 1 diabetes mellitus with other specified complication: Secondary | ICD-10-CM

## 2023-05-26 DIAGNOSIS — E1142 Type 2 diabetes mellitus with diabetic polyneuropathy: Secondary | ICD-10-CM | POA: Diagnosis present

## 2023-05-26 DIAGNOSIS — Z8249 Family history of ischemic heart disease and other diseases of the circulatory system: Secondary | ICD-10-CM

## 2023-05-26 DIAGNOSIS — Z91128 Patient's intentional underdosing of medication regimen for other reason: Secondary | ICD-10-CM

## 2023-05-26 DIAGNOSIS — E1165 Type 2 diabetes mellitus with hyperglycemia: Secondary | ICD-10-CM | POA: Diagnosis not present

## 2023-05-26 LAB — COMPREHENSIVE METABOLIC PANEL
ALT: 22 U/L (ref 0–44)
AST: 37 U/L (ref 15–41)
Albumin: 4.3 g/dL (ref 3.5–5.0)
Alkaline Phosphatase: 55 U/L (ref 38–126)
Anion gap: 9 (ref 5–15)
BUN: 34 mg/dL — ABNORMAL HIGH (ref 6–20)
CO2: 21 mmol/L — ABNORMAL LOW (ref 22–32)
Calcium: 9.1 mg/dL (ref 8.9–10.3)
Chloride: 104 mmol/L (ref 98–111)
Creatinine, Ser: 1.82 mg/dL — ABNORMAL HIGH (ref 0.44–1.00)
GFR, Estimated: 37 mL/min — ABNORMAL LOW (ref >60)
Glucose, Bld: 234 mg/dL — ABNORMAL HIGH (ref 70–99)
Potassium: 4.2 mmol/L (ref 3.5–5.1)
Sodium: 134 mmol/L — ABNORMAL LOW (ref 135–145)
Total Bilirubin: 0.6 mg/dL (ref 0.0–1.2)
Total Protein: 7.4 g/dL (ref 6.5–8.1)

## 2023-05-26 LAB — RESP PANEL BY RT-PCR (RSV, FLU A&B, COVID)  RVPGX2
Influenza A by PCR: NEGATIVE
Influenza B by PCR: NEGATIVE
Resp Syncytial Virus by PCR: NEGATIVE
SARS Coronavirus 2 by RT PCR: NEGATIVE

## 2023-05-26 LAB — CBG MONITORING, ED
Glucose-Capillary: 210 mg/dL — ABNORMAL HIGH (ref 70–99)
Glucose-Capillary: 65 mg/dL — ABNORMAL LOW (ref 70–99)
Glucose-Capillary: 197 mg/dL — ABNORMAL HIGH (ref 70–99)
Glucose-Capillary: 162 mg/dL — ABNORMAL HIGH (ref 70–99)

## 2023-05-26 LAB — MAGNESIUM: Magnesium: 1.6 mg/dL — ABNORMAL LOW (ref 1.7–2.4)

## 2023-05-26 LAB — CBC WITH DIFFERENTIAL/PLATELET
Abs Immature Granulocytes: 0.01 10*3/uL (ref 0.00–0.07)
Basophils Absolute: 0.1 10*3/uL (ref 0.0–0.1)
Basophils Relative: 2 %
Eosinophils Absolute: 0.1 10*3/uL (ref 0.0–0.5)
Eosinophils Relative: 3 %
HCT: 22.7 % — ABNORMAL LOW (ref 36.0–46.0)
Hemoglobin: 7.6 g/dL — ABNORMAL LOW (ref 12.0–15.0)
Immature Granulocytes: 0 %
Lymphocytes Relative: 46 %
Lymphs Abs: 1.6 10*3/uL (ref 0.7–4.0)
MCH: 29.7 pg (ref 26.0–34.0)
MCHC: 33.5 g/dL (ref 30.0–36.0)
MCV: 88.7 fL (ref 80.0–100.0)
Monocytes Absolute: 0.2 10*3/uL (ref 0.1–1.0)
Monocytes Relative: 6 %
Neutro Abs: 1.5 10*3/uL — ABNORMAL LOW (ref 1.7–7.7)
Neutrophils Relative %: 43 %
Platelets: 141 10*3/uL — ABNORMAL LOW (ref 150–400)
RBC: 2.56 MIL/uL — ABNORMAL LOW (ref 3.87–5.11)
RDW: 13.7 % (ref 11.5–15.5)
WBC: 3.5 10*3/uL — ABNORMAL LOW (ref 4.0–10.5)
nRBC: 0 % (ref 0.0–0.2)

## 2023-05-26 LAB — LACTIC ACID, PLASMA
Lactic Acid, Venous: 3.1 mmol/L (ref 0.5–1.9)
Lactic Acid, Venous: 1.2 mmol/L (ref 0.5–1.9)

## 2023-05-26 LAB — TYPE AND SCREEN
ABO/RH(D): A POS
Antibody Screen: NEGATIVE

## 2023-05-26 LAB — BRAIN NATRIURETIC PEPTIDE: B Natriuretic Peptide: 82.6 pg/mL (ref 0.0–100.0)

## 2023-05-26 LAB — TROPONIN I (HIGH SENSITIVITY): Troponin I (High Sensitivity): 2 ng/L (ref <18)

## 2023-05-26 LAB — CORTISOL: Cortisol, Plasma: 95.5 ug/dL

## 2023-05-26 LAB — PHOSPHORUS: Phosphorus: 2.8 mg/dL (ref 2.5–4.6)

## 2023-05-26 LAB — TSH: TSH: 3.022 u[IU]/mL (ref 0.350–4.500)

## 2023-05-26 MED ORDER — HYDROCORTISONE 10 MG PO TABS: 10.0000 mg | ORAL_TABLET | ORAL | Status: DC

## 2023-05-26 MED ORDER — MIDODRINE HCL 5 MG PO TABS: 10.0000 mg | ORAL_TABLET | Freq: Three times a day (TID) | ORAL | Status: DC

## 2023-05-26 MED ORDER — HYDRALAZINE HCL 20 MG/ML IJ SOLN: 5.0000 mg | Freq: Four times a day (QID) | INTRAMUSCULAR | Status: DC | PRN

## 2023-05-26 MED ORDER — ACETAMINOPHEN 650 MG RE SUPP: 650.0000 mg | Freq: Four times a day (QID) | RECTAL | Status: DC | PRN

## 2023-05-26 MED ORDER — HYDROCORTISONE 10 MG PO TABS
20.0000 mg | ORAL_TABLET | Freq: Every day | ORAL | Status: DC
  Administered 2023-05-27: 20 mg via ORAL
  Filled 2023-05-26: qty 2

## 2023-05-26 MED ORDER — LACTATED RINGERS IV SOLN: INTRAVENOUS | Status: DC

## 2023-05-26 MED ORDER — BLOOD PRESSURE CUFF MISC: 0 refills | Status: DC

## 2023-05-26 MED ORDER — INSULIN ASPART 100 UNIT/ML IJ SOLN
0.0000 [IU] | Freq: Three times a day (TID) | INTRAMUSCULAR | Status: DC
Start: 2023-05-26 — End: 2023-05-26

## 2023-05-26 MED ORDER — VANCOMYCIN HCL IN DEXTROSE 1-5 GM/200ML-% IV SOLN
1000.0000 mg | Freq: Once | INTRAVENOUS | Status: AC
  Administered 2023-05-26: 1000 mg via INTRAVENOUS
  Filled 2023-05-26: qty 200

## 2023-05-26 MED ORDER — GLUCERNA 1.5 CAL PO LIQD
640.0000 mL | Freq: Every day | ORAL | Status: DC
Start: 2023-05-26 — End: 2023-05-27

## 2023-05-26 MED ORDER — SODIUM CHLORIDE 0.9 % IV SOLN: 2.0000 g | INTRAVENOUS | Status: DC

## 2023-05-26 MED ORDER — SODIUM CHLORIDE 0.9 % IV BOLUS
500.0000 mL | Freq: Once | INTRAVENOUS | Status: AC
  Administered 2023-05-26: 500 mL via INTRAVENOUS

## 2023-05-26 MED ORDER — MIDODRINE HCL 5 MG PO TABS
5.0000 mg | ORAL_TABLET | Freq: Three times a day (TID) | ORAL | Status: DC
Start: 2023-05-26 — End: 2023-05-28
  Administered 2023-05-26 (×2): 5 mg via ORAL
  Filled 2023-05-26 (×3): qty 1

## 2023-05-26 MED ORDER — METRONIDAZOLE 500 MG/100ML IV SOLN
500.0000 mg | Freq: Once | INTRAVENOUS | Status: AC
  Administered 2023-05-26: 500 mg via INTRAVENOUS
  Filled 2023-05-26: qty 100

## 2023-05-26 MED ORDER — VANCOMYCIN HCL 750 MG/150ML IV SOLN: 750.0000 mg | INTRAVENOUS | Status: DC

## 2023-05-26 MED ORDER — INSULIN ASPART 100 UNIT/ML IJ SOLN
0.0000 [IU] | Freq: Every day | INTRAMUSCULAR | Status: DC
Start: 2023-05-26 — End: 2023-05-26

## 2023-05-26 MED ORDER — MIDODRINE HCL 2.5 MG PO TABS: 2.5000 mg | ORAL_TABLET | Freq: Three times a day (TID) | ORAL | 3 refills | Status: DC | PRN

## 2023-05-26 MED ORDER — ONDANSETRON HCL 4 MG PO TABS: 4.0000 mg | ORAL_TABLET | Freq: Four times a day (QID) | ORAL | Status: DC | PRN

## 2023-05-26 MED ORDER — HYDROCORTISONE SOD SUC (PF) 100 MG IJ SOLR
50.0000 mg | Freq: Once | INTRAMUSCULAR | Status: AC
  Administered 2023-05-26: 50 mg via INTRAVENOUS
  Filled 2023-05-26: qty 1

## 2023-05-26 MED ORDER — LEVOTHYROXINE SODIUM 75 MCG PO TABS
75.0000 ug | ORAL_TABLET | Freq: Every day | ORAL | Status: DC
  Administered 2023-05-27: 75 ug via ORAL
  Filled 2023-05-26: qty 1
  Filled 2023-05-26: qty 3

## 2023-05-26 MED ORDER — LACTATED RINGERS IV SOLN
150.0000 mL/h | INTRAVENOUS | Status: DC
Start: 2023-05-26 — End: 2023-05-26

## 2023-05-26 MED ORDER — ONDANSETRON HCL 4 MG/2ML IJ SOLN
4.0000 mg | Freq: Four times a day (QID) | INTRAMUSCULAR | Status: DC | PRN
  Administered 2023-05-26: 4 mg via INTRAVENOUS
  Filled 2023-05-26: qty 2

## 2023-05-26 MED ORDER — LORAZEPAM 0.5 MG PO TABS
0.5000 mg | ORAL_TABLET | Freq: Four times a day (QID) | ORAL | Status: DC | PRN
  Administered 2023-05-26: 0.5 mg via ORAL
  Filled 2023-05-26: qty 1

## 2023-05-26 MED ORDER — ACETAMINOPHEN 500 MG PO TABS
1000.0000 mg | ORAL_TABLET | Freq: Once | ORAL | Status: DC
  Filled 2023-05-26: qty 2

## 2023-05-26 MED ORDER — DEXTROSE 50 % IV SOLN
1.0000 | INTRAVENOUS | Status: DC | PRN
  Administered 2023-05-26: 50 mL via INTRAVENOUS
  Filled 2023-05-26: qty 50

## 2023-05-26 MED ORDER — HYDROCORTISONE 10 MG PO TABS
10.0000 mg | ORAL_TABLET | Freq: Every day | ORAL | Status: DC
  Administered 2023-05-26 – 2023-05-27 (×2): 10 mg via ORAL
  Filled 2023-05-26 (×2): qty 1

## 2023-05-26 MED ORDER — SODIUM CHLORIDE 0.9 % IV SOLN
2.0000 g | Freq: Once | INTRAVENOUS | Status: AC
  Administered 2023-05-26: 2 g via INTRAVENOUS
  Filled 2023-05-26: qty 12.5

## 2023-05-26 MED ORDER — METRONIDAZOLE 500 MG/100ML IV SOLN
500.0000 mg | Freq: Two times a day (BID) | INTRAVENOUS | Status: DC
  Administered 2023-05-26 – 2023-05-27 (×2): 500 mg via INTRAVENOUS
  Filled 2023-05-26 (×2): qty 100

## 2023-05-26 MED ORDER — ACETAMINOPHEN 325 MG PO TABS
650.0000 mg | ORAL_TABLET | Freq: Four times a day (QID) | ORAL | Status: DC | PRN
  Filled 2023-05-26: qty 2

## 2023-05-26 MED ORDER — SODIUM CHLORIDE 0.9 % IV BOLUS
1000.0000 mL | Freq: Once | INTRAVENOUS | Status: AC
Start: 2023-05-26 — End: 2023-05-26
  Administered 2023-05-26: 1000 mL via INTRAVENOUS

## 2023-05-26 MED ORDER — INSULIN ASPART 100 UNIT/ML IJ SOLN
0.0000 [IU] | INTRAMUSCULAR | Status: DC
  Administered 2023-05-26: 1 [IU] via SUBCUTANEOUS
  Administered 2023-05-27: 6 [IU] via SUBCUTANEOUS
  Filled 2023-05-26 (×3): qty 1

## 2023-05-26 NOTE — Progress Notes (Signed)
 Elink is following code sepsis.

## 2023-05-26 NOTE — ED Notes (Signed)
 EDP at bedside

## 2023-05-26 NOTE — Assessment & Plan Note (Signed)
 Insulin SSI with at bedtime coverage ordered, renal dosing Goal inpatient blood glucose levels 140-180

## 2023-05-26 NOTE — Assessment & Plan Note (Signed)
 Check serum phosphorus level on admission

## 2023-05-26 NOTE — Assessment & Plan Note (Signed)
 Registered dietitian has been consulted to resume tube feeds

## 2023-05-26 NOTE — ED Provider Notes (Signed)
 [facility] Provider Note    Event Date/Time   First MD Initiated Contact with Patient 05/26/23 1217     (approximate)   History   Hypotension   HPI  Ashley Camacho is a 33 y.o. female  with h/o DM, CHF, here with hypotension. Pt sent in from cardiology. She says she has been increasingly weak over the past several days, she has had mild generalized weakness and fatigue. She has had some SOB. Over the past 24 hr she has felt more SOB, lightheaded w/ standing, and had some chest pressure. No cough. No SOB at rest. She went to her Cardiologist today and was sent here when BP was 60s systolic. Reports she does feel faint. No syncope. No focal numbness or weakness. No known fevers.      Physical Exam   Triage Vital Signs: ED Triage Vitals [05/26/23 1209]  Encounter Vitals Group     BP (!) 69/54     Systolic BP Percentile      Diastolic BP Percentile      Pulse Rate 82     Resp 18     Temp 97.6 F (36.4 C)     Temp Source Oral     SpO2 95 %     Weight 91 lb (41.3 kg)     Height 5' 1 (1.549 m)     Head Circumference      Peak Flow      Pain Score 0     Pain Loc      Pain Education      Exclude from Growth Chart     Most recent vital signs: Vitals:   05/26/23 1500 05/26/23 1530  BP: (!) 95/59 112/70  Pulse: 66 72  Resp: 15 14  Temp:    SpO2: 100% 100%     General: Awake, no distress.  CV:  Good peripheral perfusion. RRR. Resp:  Normal work of breathing. Lungs clear. Abd:  No distention. No tenderness. No guarding or rebound. PEG tube in place. Other:  Dry MM. Cachectic.   ED Results / Procedures / Treatments   Labs (all labs ordered are listed, but only abnormal results are displayed) Labs Reviewed  CBC WITH DIFFERENTIAL/PLATELET - Abnormal; Notable for the following components:      Result Value   WBC 3.5 (*)    RBC 2.56 (*)    Hemoglobin 7.6 (*)    HCT 22.7 (*)    Platelets 141 (*)    Neutro Abs 1.5 (*)    All other  components within normal limits  COMPREHENSIVE METABOLIC PANEL - Abnormal; Notable for the following components:   Sodium 134 (*)    CO2 21 (*)    Glucose, Bld 234 (*)    BUN 34 (*)    Creatinine, Ser 1.82 (*)    GFR, Estimated 37 (*)    All other components within normal limits  LACTIC ACID, PLASMA - Abnormal; Notable for the following components:   Lactic Acid, Venous 3.1 (*)    All other components within normal limits  BLOOD GAS, VENOUS - Abnormal; Notable for the following components:   pO2, Ven <31 (*)    Acid-base deficit 3.9 (*)    All other components within normal limits  CBG MONITORING, ED - Abnormal; Notable for the following components:   Glucose-Capillary 65 (*)    All other components within normal limits  CULTURE, BLOOD (SINGLE)  RESP PANEL BY RT-PCR (RSV, FLU A&B, COVID)  RVPGX2  BRAIN NATRIURETIC PEPTIDE  LACTIC ACID, PLASMA  CORTISOL  TSH  MAGNESIUM   PHOSPHORUS  TYPE AND SCREEN  TROPONIN I (HIGH SENSITIVITY)     EKG Normal sinus rhythm, VR 72. PR 178, QRS 89 QTc 474. No acute St elevations or depressions. No ischemia or infarct.   RADIOLOGY CXR: Negative on my prelim   I also independently reviewed and agree with radiologist interpretations.   PROCEDURES:  Critical Care performed: Yes, see critical care procedure note(s)  .Critical Care  Performed by: Angelena Smalls, MD Authorized by: Angelena Smalls, MD   Critical care provider statement:    Critical care time (minutes):  30   Critical care time was exclusive of:  Separately billable procedures and treating other patients   Critical care was necessary to treat or prevent imminent or life-threatening deterioration of the following conditions:  Cardiac failure, circulatory failure and respiratory failure   Critical care was time spent personally by me on the following activities:  Development of treatment plan with patient or surrogate, discussions with consultants, evaluation of patient's  response to treatment, examination of patient, ordering and review of laboratory studies, ordering and review of radiographic studies, ordering and performing treatments and interventions, pulse oximetry, re-evaluation of patient's condition and review of old charts .Ultrasound ED Peripheral IV (Provider)  Date/Time: 05/26/2023 3:37 PM  Performed by: Angelena Smalls, MD Authorized by: Angelena Smalls, MD   Procedure details:    Indications: multiple failed IV attempts     Skin Prep: chlorhexidine  gluconate     Location:  Left AC   Angiocath:  20 G   Bedside Ultrasound Guided: Yes     Images: archived     Patient tolerated procedure without complications: No     Dressing applied: Yes         MEDICATIONS ORDERED IN ED: Medications  lactated ringers  infusion ( Intravenous New Bag/Given 05/26/23 1501)  metroNIDAZOLE  (FLAGYL ) IVPB 500 mg (500 mg Intravenous New Bag/Given 05/26/23 1510)  vancomycin  (VANCOCIN ) IVPB 1000 mg/200 mL premix (has no administration in time range)  midodrine  (PROAMATINE ) tablet 5 mg (5 mg Oral Given 05/26/23 1448)  levothyroxine  (SYNTHROID ) tablet 75 mcg (has no administration in time range)  acetaminophen  (TYLENOL ) tablet 650 mg (has no administration in time range)    Or  acetaminophen  (TYLENOL ) suppository 650 mg (has no administration in time range)  ondansetron  (ZOFRAN ) tablet 4 mg (has no administration in time range)    Or  ondansetron  (ZOFRAN ) injection 4 mg (has no administration in time range)  metroNIDAZOLE  (FLAGYL ) IVPB 500 mg (has no administration in time range)  insulin  aspart (novoLOG ) injection 0-6 Units (has no administration in time range)  dextrose  50 % solution 50 mL (has no administration in time range)  sodium chloride  0.9 % bolus 500 mL (0 mLs Intravenous Stopped 05/26/23 1402)  sodium chloride  0.9 % bolus 1,000 mL (1,000 mLs Intravenous New Bag/Given 05/26/23 1449)  ceFEPIme  (MAXIPIME ) 2 g in sodium chloride  0.9 % 100 mL IVPB (0 g Intravenous  Stopped 05/26/23 1524)  hydrocortisone  sodium succinate  (SOLU-CORTEF ) 100 MG injection 50 mg (50 mg Intravenous Given 05/26/23 1511)     IMPRESSION / MDM / ASSESSMENT AND PLAN / ED COURSE  I reviewed the triage vital signs and the nursing notes.                              Differential diagnosis includes, but is not limited to, recurrent  hypotension from DKA, anemia, CHF, adrenal insufficiency, sepsis  Patient's presentation is most consistent with acute presentation with potential threat to life or bodily function.  The patient is on the cardiac monitor to evaluate for evidence of arrhythmia and/or significant heart rate changes  33 yo F with extensive PMHx including labile DM, gastroparesis, postpartum CM with CHF, here with generalized weakness and dehydration, with hypotension in clinic. Suspect mostly dehydration 2/2 hyperglycemia and poor PO intake, mild acute on chronic anemia. DDx includes sepsis, however, and Cx, UCx sent (single culture 2/2 difficult stick). Will start empiric ABX, stress-dose hydrocortisone  given h/o adrenal insufficiency, and broad-spectrum  ABX. Clinically, pt appears dehydrated so will start IVF. Blood gas with likely met acidosis but no signs of DKA on CMP. CBC with Hgb 7.6. LA 3.1. Start fluids, empiric abx, steroids, admit to medicine.     FINAL CLINICAL IMPRESSION(S) / ED DIAGNOSES   Final diagnoses:  Dehydration  Type 1 diabetes mellitus without complication (HCC)  Hypotension, unspecified hypotension type  Chronic anemia     Rx / DC Orders   ED Discharge Orders     None        Note:  This document was prepared using Dragon voice recognition software and may include unintentional dictation errors.   Angelena Smalls, MD 05/26/23 [phone]

## 2023-05-26 NOTE — Assessment & Plan Note (Addendum)
 Patient currently hypotensive No indication for scheduled antihypertensive medication at this time Hydralazine 5 mg IV every 6 hours as needed for SBP > 165, 5 days ordered

## 2023-05-26 NOTE — Assessment & Plan Note (Addendum)
 Secondary to missing her Cortef  dosing in the morning due to rushing to her cardiology appointment and patient has not been taking her evening dosing due to not understanding that she was supposed to Home Cortef  20 mg in the morning with breakfast and 10 mg daily with supper resumed on admission Home midodrine  5 mg 3 times daily with meals resumed on admission Admit to telemetry cardiac, inpatient  Extensive counseling at bedside to take Cortef  20 mg in the morning and 10 mg at night until patient is seen by an endocrinologist for further guidance.  Endorses understanding and compliance.  Patient was able to repeat back to me the instructions regarding her Cortef  dosing accurately.

## 2023-05-26 NOTE — Hospital Course (Addendum)
 [name] is a 33 year old female with history of IDDM1, history of adrenal insufficiency, gastroparesis, malnutrition status post J-tube placement, dilated cardiomyopathy, normocytic anemia, history of DVT, currently not on anticoagulation, who presents ED for chief concerns of hypotension from outpatient cardiology clinic.  Vitals in the ED showed temperature of 97.6, respiration rate 19, heart rate of 84, blood pressure 81/56, SpO2 of 100% on room air.  Serum sodium is 134, potassium 4.2, chloride 104, bicarb 21, BUN of 34, serum creatinine 1.82, EGFR of 37, nonfasting blood glucose 234, WBC 3.5, hemoglobin 7.6, platelets of 141.  Lactic acid is 3.1.  VBG 7.25/55/ pO2<31.  ED treatment: Solu-Cortef  50 mg IV one-time dose ordered, cefepime  2 g IV, vancomycin  per pharmacy, Flagyl  500 mg IV one-time dose, sodium chloride  1.5 L bolus, LR infusion at 150 mL/h, midodrine  5 mg 3 times daily with meals resumed by EDP.

## 2023-05-26 NOTE — Consult Note (Addendum)
 Pharmacy Antibiotic Note  Ashley Camacho is a 33 y.o. female admitted on 05/26/2023 with sepsis source  unknown . Patient presented with hypotension and code sepsis was called. Pharmacy has been consulted for vancomycin  and cefepime  dosing.  Today, 05/26/2023 Scr 1.82 (baseline 1.5 - 2.5)  WBC 3.5 Lactic acid 3.1 Afebrile  1/2 Chest scan: results pending   Plan: D1 antibiotics S/p cefepime  2g, vancomycin  1 g, and flagyl  in the ED Give vancomycin  750 mg IV Q48H; Goal AUC 400-550. Expected AUC: 443.8 Expected Css min: 8.9 SCr used: 1.82  Weight used: IBW, Vd used: 0.72 (BMI 17) Vancomycin  was dosed this way to prevent toxicity if dosing at Q36H or 1000 mg Q48H as this gives a higher AUC. Follow for renal function.  Patient is also on cefepime  2g  Q24H Patient also on Flagyl  500 mg IV Q12H Continue to monitor renal function and follow culture results Monitor for potential source of infection for antibiotic optimization.   Height: 5' 1 (154.9 cm) Weight: 41.3 kg (91 lb) IBW/kg (Calculated) : 47.8  Temp (24hrs), Avg:97.6 F (36.4 C), Min:97.6 F (36.4 C), Max:97.6 F (36.4 C)  Recent Labs  Lab 05/26/23 1258  WBC 3.5*  CREATININE 1.82*  LATICACIDVEN 3.1*    Estimated Creatinine Clearance: 28.9 mL/min (A) (by C-G formula based on SCr of 1.82 mg/dL (H)).    No Known Allergies  Antimicrobials this admission: 1/2 Cefepime  2g >>  1/2 Flagyl  500 mg BID >>  Vancomycin  750 mg Q36H >>  Dose adjustments this admission: NA  Microbiology results: 1/2 Single BCx: IP  Thank you for allowing pharmacy to be a part of this patient's care.  Alfonso MARLA Buys, PharmD Pharmacy Resident  05/26/2023 3:43 PM

## 2023-05-26 NOTE — Assessment & Plan Note (Signed)
 Check magnesium level on admission

## 2023-05-26 NOTE — ED Notes (Signed)
 Critical lactic of 3.1 taken by this RN. Erma Heritage, MD notified at this time.

## 2023-05-26 NOTE — ED Triage Notes (Signed)
 Patient to ED via POV for hypotension. PT was cardiologist appointment for heart failure and BP laying down was in the 90's and dropped into the 70's while sitting. PT pale in color and c/o dizziness/weakness.

## 2023-05-26 NOTE — Assessment & Plan Note (Signed)
 With brittle diabetes

## 2023-05-26 NOTE — Assessment & Plan Note (Signed)
 Insulin aspart 0 to 6 units subcutaneous every 4 hours, very sensitive dosing ordered at the recommendation of diabetes melitis nursing CBG monitoring every 4 hours Goal blood glucose on admission is 140-180

## 2023-05-26 NOTE — Assessment & Plan Note (Signed)
 Home levothyroxine 75 mcg daily resumed on admission

## 2023-05-26 NOTE — H&P (Addendum)
 History and Physical   Ashley Camacho FMW:[id number] DOB: Jan 25, 1991 DOA: 05/26/2023  PCP: Ostwalt, [name], PA-C  Outpatient Specialists: Dr. Babara, hematology Patient coming from: Cardiology clinic outpatient via POV  I have personally briefly reviewed patient's old medical records in [facility] EMR.  Chief Concern: Hypertension  HPI: [name] is a 33 year old female with history of IDDM1, history of adrenal insufficiency, gastroparesis, malnutrition status post J-tube placement, dilated cardiomyopathy, normocytic anemia, history of DVT, currently not on anticoagulation, who presents ED for chief concerns of hypotension from outpatient cardiology clinic.  Vitals in the ED showed temperature of 97.6, respiration rate 19, heart rate of 84, blood pressure 81/56, SpO2 of 100% on room air.  Serum sodium is 134, potassium 4.2, chloride 104, bicarb 21, BUN of 34, serum creatinine 1.82, EGFR of 37, nonfasting blood glucose 234, WBC 3.5, hemoglobin 7.6, platelets of 141.  Lactic acid is 3.1.  VBG 7.25/55/ pO2<31.  ED treatment: Solu-Cortef  50 mg IV one-time dose ordered, cefepime  2 g IV, vancomycin  per pharmacy, Flagyl  500 mg IV one-time dose, sodium chloride  1.5 L bolus, LR infusion at 150 mL/h, midodrine  5 mg 3 times daily with meals resumed by EDP. ------------------------------------- At bedside, patient is awake alert and oriented x 3.  Patient reports that she had her cardiologist appointment this morning and her mother came to pick her up to early and so she was not able to have breakfast.  Furthermore, she missed her a.m. dosing of Cortef .  She reports that she only takes Cortef  in the morning and that there is no further dosing that she has been taking.  She reports that she did not know she was supposed to take her Cortef  medication twice a day.  She reports that she has been able to get her tube feeds.  She reports that she does sleep with her 61-year-old and the 31-year-old has  not been pulling her tube feeds.  She denies being in pain at this time.  She denies chest pain, abdominal pain, dysuria, hematuria, diarrhea, swelling of her lower extremities, syncope.  She denies changes to her urinary habits, blood in her stool, nausea, vomiting, cough, fever, chills.  He endorses weakness and near syncope feeling which has now resolved.  Social history: She lives at home, sharing a place with her ex boyfriend.  She reports she was never married.  They shared the 43-year-old daughter however her ex-boyfriend does not help her care for the child.  She denies tobacco, EtOH, recreational drug use.  ROS: Constitutional: no weight change, no fever ENT/Mouth: no sore throat, no rhinorrhea Eyes: no eye pain, no vision changes Cardiovascular: no chest pain, no dyspnea,  no edema, no palpitations Respiratory: no cough, no sputum, no wheezing Gastrointestinal: no nausea, no vomiting, no diarrhea, no constipation Genitourinary: no urinary incontinence, no dysuria, no hematuria Musculoskeletal: no arthralgias, no myalgias Skin: no skin lesions, no pruritus, Neuro: + weakness, no loss of consciousness, no syncope Psych: no anxiety, no depression, + decrease appetite Heme/Lymph: no bruising, no bleeding  ED Course: Discussed with EDP, patient requiring hospitalization for chief concerns of hypotension and sepsis criteria, concerning for septic shock.  Assessment/Plan  Principal Problem:   Hypotension Active Problems:   Brittle diabetes (HCC)   Hypomagnesemia   Hypophosphatemia   Severe malnutrition (HCC)   Type 1 diabetes mellitus without complications (HCC)   Hypothyroidism   Major depressive disorder, recurrent episode, moderate (HCC)   Gastroparesis   Adrenal insufficiency (HCC)   Anxiety  Diabetic sensorimotor neuropathy (HCC)   HFrEF (heart failure with reduced ejection fraction) (HCC)   S/P cesarean section   Essential hypertension   Stage 3b chronic kidney  disease (HCC)   Protein-calorie malnutrition, severe (HCC)   Severe recurrent major depression (HCC)   Failure to thrive in adult   Anemia in chronic kidney disease (CKD)   History of DVT (deep vein thrombosis)   Lactic acid acidosis   Assessment and Plan:  * Hypotension Secondary to missing her Cortef  dosing in the morning due to rushing to her cardiology appointment and patient has not been taking her evening dosing due to not understanding that she was supposed to Home Cortef  20 mg in the morning with breakfast and 10 mg daily with supper resumed on admission Home midodrine  5 mg 3 times daily with meals resumed on admission Admit to telemetry cardiac, inpatient  Extensive counseling at bedside to take Cortef  20 mg in the morning and 10 mg at night until patient is seen by an endocrinologist for further guidance.  Endorses understanding and compliance.  Patient was able to repeat back to me the instructions regarding her Cortef  dosing accurately.  Brittle diabetes (HCC) Insulin  aspart 0 to 6 units subcutaneous every 4 hours, very sensitive dosing ordered at the recommendation of diabetes melitis nursing CBG monitoring every 4 hours Goal blood glucose on admission is 140-180  Type 1 diabetes mellitus without complications (HCC) Insulin  SSI with at bedtime coverage ordered, renal dosing Goal inpatient blood glucose levels 140-180  Severe malnutrition (HCC) Registered dietitian has been consulted to resume tube feeds  Hypophosphatemia Check serum phosphorus level on admission  Hypomagnesemia Check magnesium  level on admission  Hypothyroidism Home levothyroxine  75 mcg daily resumed on admission  Gastroparesis With brittle diabetes  Adrenal insufficiency (HCC) Status post Solu-Cortef  50 mg IV one-time dose per EDP  Lactic acid acidosis There is no source of infection at this time Blood cultures, single set ordered by EDP and is currently in process Portable chest x-ray  was negative for acute cardiopulmonary process UA on admission, will order appropriate antibiotic should UA be positive for leukocytes or nitrates given the patient has diabetes and may not be able to feel dysuria AM team to order broad-spectrum antibiotic as appropriate  History of DVT (deep vein thrombosis) Patient currently not on anticoagulation  Anemia in chronic kidney disease (CKD) Check CBC in the a.m.  Protein-calorie malnutrition, severe [facility]) Registered dietitian has been consulted to resume tube feeds  Essential hypertension Patient currently hypotensive No indication for scheduled antihypertensive medication at this time Hydralazine  5 mg IV every 6 hours as needed for SBP > 165, 5 days ordered  Chart reviewed.   DVT prophylaxis: TED hose Code Status: Full code Diet: Heart healthy/carb modified diet resumed on admission; registered dietitian has been consulted to resume tube feed Family Communication: A phone call was offered, patient declined stating that her mother already knows she is in the hospital Disposition Plan: Pending clinical course; guarded prognosis Consults called: None at this time Admission status: Telemetry cardiac, inpatient initially ordered however due to patient improving with stress dosing and on H&P gathering process, patient endorses that she has not been taking her course of medication as prescribed due to lack of understanding.  Bed has been placed to telemetry medical.  The appropriate patient status for this patient is INPATIENT. Inpatient status is judged to be reasonable and necessary in order to provide the required intensity of service to ensure the patient's safety. The  patient's presenting symptoms, physical exam findings, and initial radiographic and laboratory data in the context of their chronic comorbidities is felt to place them at high risk for further clinical deterioration. Furthermore, it is not anticipated that the patient will be  medically stable for discharge from the hospital within 2 midnights of admission.    * I certify that at the point of admission it is my clinical judgment that the patient will require inpatient hospital care spanning beyond 2 midnights from the point of admission due to high intensity of service, high risk for further deterioration and high frequency of surveillance required.*  Past Medical History:  Diagnosis Date   Acute metabolic encephalopathy 06/10/2022   Adrenal insufficiency (HCC)    Anemia    CHF (congestive heart failure) (HCC)    Gastroparesis    Hypertension    Hypotension    Intractable nausea and vomiting 11/13/2022   Migraine    Thyroid  disease    Type 1 diabetes [facility])    Past Surgical History:  Procedure Laterality Date   BIOPSY  01/14/2021   Procedure: BIOPSY;  Surgeon: Eda Iha, MD;  Location: [facility] ENDOSCOPY;  Service: Gastroenterology;;   CESAREAN SECTION     x2   CESAREAN SECTION WITH BILATERAL TUBAL LIGATION N/A 02/17/2021   Procedure: CESAREAN SECTION WITH BILATERAL TUBAL LIGATION;  Surgeon: Barbra Lang PARAS, DO;  Location: [facility] LD ORS;  Service: Obstetrics;  Laterality: N/A;   ESOPHAGOGASTRODUODENOSCOPY (EGD) WITH PROPOFOL  N/A 01/14/2021   Procedure: ESOPHAGOGASTRODUODENOSCOPY (EGD) WITH PROPOFOL ;  Surgeon: Eda Iha, MD;  Location: [facility] ENDOSCOPY;  Service: Gastroenterology;  Laterality: N/A;   IR BONE MARROW BIOPSY & ASPIRATION  12/16/2022   IR REPLC DUODEN/JEJUNO TUBE PERCUT W/FLUORO  11/22/2022   JEJUNOSTOMY N/A 06/13/2022   Procedure: DEXTER;  Surgeon: Jordis Laneta FALCON, MD;  Location: [facility] ORS;  Service: General;  Laterality: N/A;   MOUTH SURGERY     Social History:  reports that she has never smoked. She has never been exposed to tobacco smoke. She has never used smokeless tobacco. She reports that she does not drink alcohol and does not use drugs.  No Known Allergies Family History  Problem Relation Age of Onset   Breast cancer Mother     Seizures Mother    Diabetes type I Father    CAD Father    Lung cancer Maternal Grandfather    CAD Paternal Grandmother    CAD Paternal Grandfather    Ovarian cancer Neg Hx    Family history: Family history reviewed and not pertinent.  Prior to Admission medications   Medication Sig Start Date End Date Taking? Authorizing Provider  acetaminophen  (TYLENOL ) 500 MG tablet Take 2 tablets (1,000 mg total) by mouth every 6 (six) hours as needed for headache, fever or moderate pain. 08/05/22  Yes Dorinda Drue DASEN, MD  insulin  aspart (NOVOLOG ) 100 UNIT/ML injection Inject 2 Units into the skin 3 (three) times daily with meals. 05/10/23 10/24/23 Yes Ostwalt, [name], PA-C  insulin  glargine (LANTUS ) 100 UNIT/ML Solostar Pen Inject 6-7 Units into the skin daily. 05/10/23  Yes Ostwalt, [name], PA-C  levothyroxine  (SYNTHROID ) 75 MCG tablet Take 1 tablet (75 mcg total) by mouth daily at 6 (six) AM. 05/10/23  Yes Ostwalt, [name], PA-C  loperamide  (IMODIUM ) 2 MG capsule Take 2 capsules (4 mg total) by mouth as needed for diarrhea or loose stools. 11/15/22  Yes Lenon Marien CROME, MD  promethazine  (PHENERGAN ) 25 MG tablet Take 0.5 tablets (12.5 mg total) by mouth every  8 (eight) hours as needed for nausea or vomiting. 04/10/23  Yes Darci Pore, MD  Blood Pressure Monitoring (BLOOD PRESSURE CUFF) MISC Use as directed 05/26/23   Loistine Sober, NP  midodrine  (PROAMATINE ) 2.5 MG tablet Take 1 tablet (2.5 mg total) by mouth 3 (three) times daily with meals as needed (systolic press less than 100). Patient not taking: Reported on 05/26/2023 05/26/23   Loistine Sober, NP  Nutritional Supplements (FEEDING SUPPLEMENT, GLUCERNA 1.5 CAL,) LIQD Place 640 mLs into feeding tube at bedtime. 11/15/22   Lenon Marien CROME, MD   Physical Exam: Vitals:   05/26/23 1415 05/26/23 1430 05/26/23 1500 05/26/23 1530  BP: (!) 80/57 94/66 (!) 95/59 112/70  Pulse: 64 66 66 72  Resp: 13 17 15 14   Temp:      TempSrc:       SpO2: 100% 100% 100% 100%  Weight:      Height:       Constitutional: appears frail, hypotensive, weak, cachectic appearing Eyes: PERRL, lids and conjunctivae normal ENMT: Mucous membranes are moist. Posterior pharynx clear of any exudate or lesions. Age-appropriate dentition. Hearing appropriate Neck: normal, supple, no masses, no thyromegaly Respiratory: clear to auscultation bilaterally, no wheezing, no crackles. Normal respiratory effort. No accessory muscle use.  Cardiovascular: Regular rate and rhythm, no murmurs / rubs / gallops. No extremity edema. 2+ pedal pulses. No carotid bruits.  Abdomen: no tenderness, no masses palpated, no hepatosplenomegaly. Bowel sounds positive.  G-tube in place with yellow crusting surrounding the tube feed Musculoskeletal: no clubbing / cyanosis. No joint deformity upper and lower extremities. Good ROM, no contractures, no atrophy. Normal muscle tone.  Skin: no rashes, lesions, ulcers. No induration Neurologic: Sensation intact. Strength 5/5 in all 4.  Psychiatric: Normal judgment and insight. Alert and oriented x 3.  Depressed mood.  Flat affect  EKG: independently reviewed, showing sinus rhythm with rate of 71, QTc 474  Chest x-ray on Admission: I personally reviewed and there is no x-ray evidence of acute cardiopulmonary process at this time.  DG Chest Portable 1 View Result Date: 05/26/2023 CLINICAL DATA:  Hypotension, weakness, nausea EXAM: PORTABLE CHEST 1 VIEW COMPARISON:  03/22/2023 FINDINGS: Single frontal view of the chest demonstrates an unremarkable cardiac silhouette. No acute airspace disease, effusion, or pneumothorax. No acute bony abnormalities. IMPRESSION: 1. No acute intrathoracic process. Electronically Signed   By: Ozell Daring M.D.   On: 05/26/2023 16:15    Labs on Admission: I have personally reviewed following labs  CBC: Recent Labs  Lab 05/26/23 1258  WBC 3.5*  NEUTROABS 1.5*  HGB 7.6*  HCT 22.7*  MCV 88.7  PLT 141*    Basic Metabolic Panel: Recent Labs  Lab 05/26/23 1258 05/26/23 1516  NA 134*  --   K 4.2  --   CL 104  --   CO2 21*  --   GLUCOSE 234*  --   BUN 34*  --   CREATININE 1.82*  --   CALCIUM  9.1  --   MG  --  1.6*  PHOS  --  2.8   GFR: Estimated Creatinine Clearance: 28.9 mL/min (A) (by C-G formula based on SCr of 1.82 mg/dL (H)).  Liver Function Tests: Recent Labs  Lab 05/26/23 1258  AST 37  ALT 22  ALKPHOS 55  BILITOT 0.6  PROT 7.4  ALBUMIN  4.3   Urine analysis:    Component Value Date/Time   COLORURINE YELLOW (A) 04/06/2023 2343   APPEARANCEUR HAZY (A) 04/06/2023 2343   APPEARANCEUR  Clear 09/18/2020 1153   LABSPEC 1.014 04/06/2023 2343   LABSPEC 1.026 03/01/2013 1844   PHURINE 5.0 04/06/2023 2343   GLUCOSEU >=500 (A) 04/06/2023 2343   GLUCOSEU >=500 03/01/2013 1844   HGBUR SMALL (A) 04/06/2023 2343   BILIRUBINUR NEGATIVE 04/06/2023 2343   BILIRUBINUR neg 10/23/2020 0953   BILIRUBINUR Negative 09/18/2020 1153   BILIRUBINUR Negative 03/01/2013 1844   KETONESUR 80 (A) 04/06/2023 2343   PROTEINUR 30 (A) 04/06/2023 2343   UROBILINOGEN 0.2 10/23/2020 0953   NITRITE NEGATIVE 04/06/2023 2343   LEUKOCYTESUR MODERATE (A) 04/06/2023 2343   LEUKOCYTESUR Negative 03/01/2013 1844   CRITICAL CARE Performed by: Dr. Sherre  Total critical care time: 32 minutes  Critical care time was exclusive of separately billable procedures and treating other patients.  Critical care was necessary to treat or prevent imminent or life-threatening deterioration.  Critical care was time spent personally by me on the following activities: development of treatment plan with patient and/or surrogate as well as nursing, discussions with consultants, evaluation of patient's response to treatment, examination of patient, obtaining history from patient or surrogate, ordering and performing treatments and interventions, ordering and review of laboratory studies, ordering and review of radiographic  studies, pulse oximetry and re-evaluation of patient's condition.  This document was prepared using Dragon Voice Recognition software and may include unintentional dictation errors.  Dr. Sherre [facility]  If 7PM-7AM, please contact overnight-coverage provider If 7AM-7PM, please contact day attending provider [email or web address]  05/26/2023, 4:35 PM

## 2023-05-26 NOTE — ED Notes (Signed)
 Patient ate approximately 75% of her meal tray at this time. Will continue to monitor CBG.

## 2023-05-26 NOTE — Inpatient Diabetes Management (Signed)
 Inpatient Diabetes Program Recommendations  AACE/ADA: New Consensus Statement on Inpatient Glycemic Control (2015)  Target Ranges:  Prepandial:   less than 140 mg/dL      Peak postprandial:   less than 180 mg/dL (1-2 hours)      Critically ill patients:  140 - 180 mg/dL   Lab Results  Component Value Date   GLUCAP 398 (H) 05/11/2023   HGBA1C 10.3 (H) 05/10/2023    Review of Glycemic Control  Latest Reference Range & Units 05/26/23 12:58  Glucose 70 - 99 mg/dL 765 (H)   Diabetes history: DM 1 Outpatient Diabetes medications:  Tube feeds- 640 mls/ at bedtime Lantus  6-7 units daily (last dose today 05/26/23) Novolog  2 units tid with meals  Current orders for Inpatient glycemic control:  Novolog  0-9 units tid with meals and HS  Inpatient Diabetes Program Recommendations:   Patient very sensitive to insulin  doses.  Please consider reducing Novolog  correction to 0-6 units q 4 hours.  Will need basal insulin  restarted on 05/27/23.  Dietician consult order as patient likely needs tube feeds resumed.    Thanks,  Randall Bullocks, RN, BC-ADM Inpatient Diabetes Coordinator Pager [phone]  (8a-5p)

## 2023-05-26 NOTE — Assessment & Plan Note (Addendum)
 There is no source of infection at this time Blood cultures, single set ordered by EDP and is currently in process Portable chest x-ray was negative for acute cardiopulmonary process UA on admission, will order appropriate antibiotic should UA be positive for leukocytes or nitrates given the patient has diabetes and may not be able to feel dysuria AM team to order broad-spectrum antibiotic as appropriate

## 2023-05-26 NOTE — Assessment & Plan Note (Signed)
 Patient currently not on anticoagulation

## 2023-05-26 NOTE — Patient Instructions (Signed)
 Medication Instructions:  Your physician recommends the following medication changes.  START TAKING: Midodrine  2.5 mg three times daily for Systolic BP less than 100  *If you need a refill on your cardiac medications before your next appointment, please call your pharmacy*   Lab Work: Your provider would like for you to have following labs drawn today CBC, BMT, and Magnesium  level.   If you have labs (blood work) drawn today and your tests are completely normal, you will receive your results only by: MyChart Message (if you have MyChart) OR A paper copy in the mail If you have any lab test that is abnormal or we need to change your treatment, we will call you to review the results.   Testing/Procedures: Your physician has requested that you have an echocardiogram. Echocardiography is a painless test that uses sound waves to create images of your heart. It provides your doctor with information about the size and shape of your heart and how well your heart's chambers and valves are working.   You may receive an ultrasound enhancing agent through an IV if needed to better visualize your heart during the echo. This procedure takes approximately one hour.  There are no restrictions for this procedure.  This will take place at [address] [facility] [address], [place] [address]  Please note: We ask at that you not bring children with you during ultrasound (echo/ vascular) testing. Due to room size and safety concerns, children are not allowed in the ultrasound rooms during exams. Our front office staff cannot provide observation of children in our lobby area while testing is being conducted. An adult accompanying a patient to their appointment will only be allowed in the ultrasound room at the discretion of the ultrasound technician under special circumstances. We apologize for any inconvenience.    Follow-Up: At [facility], you and your health needs are our  priority.  As part of our continuing mission to provide you with exceptional heart care, we have created designated Provider Care Teams.  These Care Teams include your primary Cardiologist (physician) and Advanced Practice Providers (APPs -  Physician Assistants and Nurse Practitioners) who all work together to provide you with the care you need, when you need it.  We recommend signing up for the patient portal called MyChart.  Sign up information is provided on this After Visit Summary.  MyChart is used to connect with patients for Virtual Visits (Telemedicine).  Patients are able to view lab/test results, encounter notes, upcoming appointments, etc.  Non-urgent messages can be sent to your provider as well.   To learn more about what you can do with MyChart, go to [email or web address].    Your next appointment:   3-4 week(s)  Provider:   Lonni Hanson, MD, Deatrice Cage, MD, or Redell Cave, MD

## 2023-05-26 NOTE — Assessment & Plan Note (Signed)
 Status post Solu-Cortef 50 mg IV one-time dose per EDP

## 2023-05-26 NOTE — Final Progress Note (Signed)
 CODE SEPSIS - PHARMACY COMMUNICATION  **Broad Spectrum Antibiotics should be administered within 1 hour of Sepsis diagnosis**  Time Code Sepsis Called/Page Received: 1424  Antibiotics Ordered: cefepime , vanc  Time of 1st antibiotic administration: 1453  Additional action taken by pharmacy: N/A  If necessary, Name of Provider/Nurse Contacted: N/A    Lum VEAR Mania ,PharmD Clinical Pharmacist  05/26/2023  2:29 PM

## 2023-05-26 NOTE — Assessment & Plan Note (Signed)
Check CBC in the a.m.

## 2023-05-27 DIAGNOSIS — I9589 Other hypotension: Secondary | ICD-10-CM | POA: Diagnosis not present

## 2023-05-27 LAB — URINALYSIS, COMPLETE (UACMP) WITH MICROSCOPIC
Bacteria, UA: NONE SEEN
Bilirubin Urine: NEGATIVE
Glucose, UA: 50 mg/dL — AB
Ketones, ur: NEGATIVE mg/dL
Nitrite: NEGATIVE
Protein, ur: NEGATIVE mg/dL
Specific Gravity, Urine: 1.005 (ref 1.005–1.030)
pH: 5 (ref 5.0–8.0)

## 2023-05-27 LAB — BASIC METABOLIC PANEL
Anion gap: 9 (ref 5–15)
BUN: 27 mg/dL — ABNORMAL HIGH (ref 6–20)
CO2: 22 mmol/L (ref 22–32)
Calcium: 8.2 mg/dL — ABNORMAL LOW (ref 8.9–10.3)
Chloride: 110 mmol/L (ref 98–111)
Creatinine, Ser: 1.47 mg/dL — ABNORMAL HIGH (ref 0.44–1.00)
GFR, Estimated: 48 mL/min — ABNORMAL LOW (ref >60)
Glucose, Bld: 92 mg/dL (ref 70–99)
Potassium: 4.6 mmol/L (ref 3.5–5.1)
Sodium: 137 mmol/L (ref 135–145)

## 2023-05-27 LAB — GLUCOSE, CAPILLARY
Glucose-Capillary: 517 mg/dL (ref 70–99)
Glucose-Capillary: 348 mg/dL — ABNORMAL HIGH (ref 70–99)

## 2023-05-27 LAB — RESPIRATORY PANEL BY PCR

## 2023-05-27 LAB — CBC
HCT: 20.4 % — ABNORMAL LOW (ref 36.0–46.0)
Hemoglobin: 7.3 g/dL — ABNORMAL LOW (ref 12.0–15.0)
MCH: 29.9 pg (ref 26.0–34.0)
MCHC: 35.8 g/dL (ref 30.0–36.0)
MCV: 83.6 fL (ref 80.0–100.0)
Platelets: 135 10*3/uL — ABNORMAL LOW (ref 150–400)
RBC: 2.44 MIL/uL — ABNORMAL LOW (ref 3.87–5.11)
RDW: 13.5 % (ref 11.5–15.5)
WBC: 3.7 10*3/uL — ABNORMAL LOW (ref 4.0–10.5)
nRBC: 0 % (ref 0.0–0.2)

## 2023-05-27 LAB — CBG MONITORING, ED
Glucose-Capillary: 112 mg/dL — ABNORMAL HIGH (ref 70–99)
Glucose-Capillary: 420 mg/dL — ABNORMAL HIGH (ref 70–99)
Glucose-Capillary: 88 mg/dL (ref 70–99)
Glucose-Capillary: 94 mg/dL (ref 70–99)

## 2023-05-27 MED ORDER — HYDROCORTISONE 10 MG PO TABS: ORAL_TABLET | ORAL | 0 refills | Status: AC

## 2023-05-27 MED ORDER — GLUCERNA SHAKE PO LIQD: 237.0000 mL | Freq: Three times a day (TID) | ORAL | Status: DC

## 2023-05-27 MED ORDER — OSMOLITE 1.2 CAL PO LIQD: 1000.0000 mL | ORAL | Status: DC

## 2023-05-27 MED ORDER — PREGABALIN 75 MG PO CAPS
75.0000 mg | ORAL_CAPSULE | Freq: Two times a day (BID) | ORAL | Status: DC
  Administered 2023-05-27: 75 mg via ORAL
  Filled 2023-05-27: qty 1

## 2023-05-27 MED ORDER — ADULT MULTIVITAMIN W/MINERALS CH: 1.0000 | ORAL_TABLET | Freq: Every day | ORAL | Status: DC

## 2023-05-27 MED ORDER — INSULIN ASPART 100 UNIT/ML IJ SOLN
8.0000 [IU] | Freq: Once | INTRAMUSCULAR | Status: AC
  Administered 2023-05-27: 8 [IU] via SUBCUTANEOUS
  Filled 2023-05-27: qty 1

## 2023-05-27 MED ORDER — GLUCERNA 1.5 CAL PO LIQD: 980.0000 mL | ORAL | Status: DC

## 2023-05-27 MED ORDER — INSULIN GLARGINE-YFGN 100 UNIT/ML ~~LOC~~ SOLN
4.0000 [IU] | Freq: Every day | SUBCUTANEOUS | Status: DC
  Administered 2023-05-27: 4 [IU] via SUBCUTANEOUS
  Filled 2023-05-27 (×2): qty 0.04

## 2023-05-27 MED ORDER — INSULIN GLARGINE-YFGN 100 UNIT/ML ~~LOC~~ SOLN: 4.0000 [IU] | Freq: Every day | SUBCUTANEOUS | Status: DC

## 2023-05-27 MED ORDER — FREE WATER: 50.0000 mL | Status: DC

## 2023-05-27 NOTE — ED Notes (Signed)
 Pt utilizes bedside commode for urine occurrence.

## 2023-05-27 NOTE — Inpatient Diabetes Management (Addendum)
 Inpatient Diabetes Program Recommendations  AACE/ADA: New Consensus Statement on Inpatient Glycemic Control (2015)  Target Ranges:  Prepandial:   less than 140 mg/dL      Peak postprandial:   less than 180 mg/dL (1-2 hours)      Critically ill patients:  140 - 180 mg/dL   Lab Results  Component Value Date   GLUCAP 112 (H) 05/27/2023   HGBA1C 10.3 (H) 05/10/2023    Review of Glycemic Control  Latest Reference Range & Units 05/26/23 19:49 05/26/23 21:25 05/26/23 23:56 05/27/23 03:48 05/27/23 07:33  Glucose-Capillary 70 - 99 mg/dL 802 (H) 837 (H) 94 88 887 (H)  Diabetes history: DM 1 Outpatient Diabetes medications:  Tube feeds- 640 mls/ at bedtime Lantus  6-7 units daily (last dose today 05/26/23) Novolog  2 units tid with meals  Current orders for Inpatient glycemic control:  Novolog  0-6 units q 4 hours  Inpatient Diabetes Program Recommendations:   Blood sugars doing well with just q 4 hour very sensitive correction.  She last took her basal insulin  yesterday therefore will likely need low dose basal insulin  restarted today.   Consider adding low dose basal insulin - Semglee  4 units daily.     Thanks,  Randall Bullocks, RN, BC-ADM Inpatient Diabetes Coordinator Pager [phone]  (8a-5p)

## 2023-05-27 NOTE — Inpatient Diabetes Management (Signed)
 Inpatient Diabetes Program Recommendations  AACE/ADA: New Consensus Statement on Inpatient Glycemic Control (2025)  Target Ranges:  Prepandial:   less than 140 mg/dL      Peak postprandial:   less than 180 mg/dL (1-2 hours)      Critically ill patients:  140 - 180 mg/dL   Lab Results  Component Value Date   GLUCAP 517 (HH) 05/27/2023   HGBA1C 10.3 (H) 05/10/2023    Review of Glycemic Control  Latest Reference Range & Units 05/27/23 07:33 05/27/23 12:01 05/27/23 13:23  Glucose-Capillary 70 - 99 mg/dL 887 (H) 579 (H) Novolog  6 units 517 (HH) Novolog  8 units  Diabetes history: DM 1 (with adrenal insuff). Outpatient Diabetes medications:  Tube feeds- 640 mls/ at bedtime Lantus  6-7 units daily (last dose today 05/26/23) Novolog  2 units tid with meals  Current orders for Inpatient glycemic control:  Novolog  0-6 units q 4 hours Semglee  4 units daily (just started)  Inpatient Diabetes Program Recommendations:    Note CBG up at noon.  Patient has received a total of 14 units of Novolog .  Recommend close monitoring by RN.   May need Novolog  meal coverage 2 units tid with meals (hold if patient eats less than 50% or NPO) for CHO coverage.  Patient very Sensitive to insulin  doses and needs low doses to prevent hypoglycemia.   Thanks,  Randall Bullocks, RN, BC-ADM Inpatient Diabetes Coordinator Pager [phone]  (8a-5p)

## 2023-05-27 NOTE — ED Notes (Signed)
 Patient ambulated to the bedside commode at this time.

## 2023-05-27 NOTE — ED Notes (Signed)
 This RN notified Sreenath, MD of patients CBG of 420 at this time. Will continue to monitor.

## 2023-05-27 NOTE — Discharge Summary (Signed)
 Physician Discharge Summary  Ashley Camacho FMW:[id number] DOB: 08-14-1990 DOA: 05/26/2023  PCP: Ostwalt, [name], PA-C  Admit date: 05/26/2023 Discharge date: 05/27/2023  Admitted From: Home Disposition:  Home  Recommendations for Outpatient Follow-up:  Follow up with PCP in 1-2 weeks Ambulatory referral to endocrinology  Home Health: No Equipment/Devices: None  Discharge Condition: Stable CODE STATUS: Full Diet recommendation: Carb modified  Brief/Interim Summary: 33 year old female with history of IDDM1, history of adrenal insufficiency, gastroparesis, malnutrition status post J-tube placement, dilated cardiomyopathy, normocytic anemia, history of DVT, currently not on anticoagulation, who presents ED for chief concerns of hypotension from outpatient cardiology clinic.   Hypotension attributed to not taking p.m. dose of Solu-Cortef  in the setting of known hypoadrenalism.  Restarted on the previously prescribed Solu-Cortef  regimen of 20 every morning, 10 every afternoon.  Blood pressure responded appropriately.  Sugars remain labile but this is a known and longstanding issue for patient.  No indication of infectious etiology.  No antibiotics indicated on discharge.  Ambulatory referral placed to [facility] endocrinology at time of DC.    Discharge Diagnoses:  Principal Problem:   Hypotension Active Problems:   Brittle diabetes (HCC)   Hypomagnesemia   Hypophosphatemia   Severe malnutrition (HCC)   Type 1 diabetes mellitus without complications (HCC)   Hypothyroidism   Major depressive disorder, recurrent episode, moderate (HCC)   Gastroparesis   Adrenal insufficiency (HCC)   Anxiety   Diabetic sensorimotor neuropathy (HCC)   HFrEF (heart failure with reduced ejection fraction) (HCC)   S/P cesarean section   Essential hypertension   Stage 3b chronic kidney disease (HCC)   Protein-calorie malnutrition, severe (HCC)   Severe recurrent major depression (HCC)   Failure to thrive  in adult   Anemia in chronic kidney disease (CKD)   History of DVT (deep vein thrombosis)   Lactic acid acidosis   Hypotension Secondary to missing her Cortef  dosing in the morning due to rushing to her cardiology appointment and patient has not been taking her evening dosing due to not understanding that she was supposed to Home Cortef  20 mg in the morning with breakfast and 10 mg daily with supper resumed on admission.  As needed midodrine  resumed on discharge.  Stable for DC home.  Outpatient referral to endocrinology placed at time of DC.  Type 1 diabetes Very brittle.  Very sensitive to insulin  with extremely labile sugars.  Ambulatory referral placed endocrinology.  Patient was previously seeing [facility] endocrinology and had been discharged from the practice due to not being able to get to appointments.  Her circumstances change and she requested a updated referral.  This has been placed at time of DC.  Discharge Instructions  Discharge Instructions     Ambulatory referral to Endocrinology   Complete by: As directed    Referral to re-establish care with endocrinology.  Type 1 diabetic who was discharged from practice years ago due to not being able to get to appointments.  Her circumstances have changed and she would benefit from re-establishing endocrinology care   Diet - low sodium heart healthy   Complete by: As directed    Increase activity slowly   Complete by: As directed       Allergies as of 05/27/2023   No Known Allergies      Medication List     TAKE these medications    acetaminophen  500 MG tablet Commonly known as: TYLENOL  Take 2 tablets (1,000 mg total) by mouth every 6 (six) hours as needed for headache,  fever or moderate pain.   Blood Pressure Cuff Misc Use as directed   feeding supplement (GLUCERNA 1.5 CAL) Liqd Place 640 mLs into feeding tube at bedtime.   hydrocortisone  10 MG tablet Commonly known as: CORTEF  Take 2 tablets (20 mg total) by mouth  daily with breakfast AND 1 tablet (10 mg total) daily with supper.   insulin  aspart 100 UNIT/ML injection Commonly known as: NovoLOG  Inject 2 Units into the skin 3 (three) times daily with meals.   insulin  glargine 100 UNIT/ML Solostar Pen Commonly known as: LANTUS  Inject 6-7 Units into the skin daily.   levothyroxine  75 MCG tablet Commonly known as: SYNTHROID  Take 1 tablet (75 mcg total) by mouth daily at 6 (six) AM.   loperamide  2 MG capsule Commonly known as: IMODIUM  Take 2 capsules (4 mg total) by mouth as needed for diarrhea or loose stools.   midodrine  2.5 MG tablet Commonly known as: PROAMATINE  Take 1 tablet (2.5 mg total) by mouth 3 (three) times daily with meals as needed (systolic press less than 100).   promethazine  25 MG tablet Commonly known as: PHENERGAN  Take 0.5 tablets (12.5 mg total) by mouth every 8 (eight) hours as needed for nausea or vomiting.        No Known Allergies  Consultations: None   Procedures/Studies: DG Chest Portable 1 View Result Date: 05/26/2023 CLINICAL DATA:  Hypotension, weakness, nausea EXAM: PORTABLE CHEST 1 VIEW COMPARISON:  03/22/2023 FINDINGS: Single frontal view of the chest demonstrates an unremarkable cardiac silhouette. No acute airspace disease, effusion, or pneumothorax. No acute bony abnormalities. IMPRESSION: 1. No acute intrathoracic process. Electronically Signed   By: Ozell Daring M.D.   On: 05/26/2023 16:15      Subjective: Seen and examined at the time of discharge.  Stable no distress.  Appropriate for discharge home.  Discharge Exam: Vitals:   05/27/23 1222 05/27/23 1321  BP:  (!) 151/79  Pulse:  67  Resp:  16  Temp: 98 F (36.7 C) 97.9 F (36.6 C)  SpO2:  100%   Vitals:   05/27/23 1100 05/27/23 1200 05/27/23 1222 05/27/23 1321  BP: 139/67 (!) 155/77  (!) 151/79  Pulse: 73 68  67  Resp: 14 12  16   Temp:   98 F (36.7 C) 97.9 F (36.6 C)  TempSrc:   Oral Oral  SpO2: 100% 100%  100%  Weight:       Height:        General: Pt is alert, awake, not in acute distress Cardiovascular: RRR, S1/S2 +, no rubs, no gallops Respiratory: CTA bilaterally, no wheezing, no rhonchi Abdominal: Soft, NT, ND, bowel sounds + Extremities: no edema, no cyanosis    The results of significant diagnostics from this hospitalization (including imaging, microbiology, ancillary and laboratory) are listed below for reference.     Microbiology: Recent Results (from the past 240 hours)  Blood culture (single)     Status: None (Preliminary result)   Collection Time: 05/26/23 12:58 PM   Specimen: BLOOD  Result Value Ref Range Status   Specimen Description BLOOD BLOOD LEFT ARM  Final   Special Requests   Final    BOTTLES DRAWN AEROBIC AND ANAEROBIC Blood Culture results may not be optimal due to an inadequate volume of blood received in culture bottles   Culture   Final    NO GROWTH < 24 HOURS Performed at [facility], [address], [place], [place] [address]    Report Status PENDING  Incomplete  Resp panel by RT-PCR (RSV, Flu A&B, Covid) Anterior Nasal Swab     Status: None   Collection Time: 05/26/23  3:16 PM   Specimen: Anterior Nasal Swab  Result Value Ref Range Status   SARS Coronavirus 2 by RT PCR NEGATIVE NEGATIVE Final    Comment: (NOTE) SARS-CoV-2 target nucleic acids are NOT DETECTED.  The SARS-CoV-2 RNA is generally detectable in upper respiratory specimens during the acute phase of infection. The lowest concentration of SARS-CoV-2 viral copies this assay can detect is 138 copies/mL. A negative result does not preclude SARS-Cov-2 infection and should not be used as the sole basis for treatment or other patient management decisions. A negative result may occur with  improper specimen collection/handling, submission of specimen other than nasopharyngeal swab, presence of viral mutation(s) within the areas targeted by this assay, and inadequate number of viral copies(<138  copies/mL). A negative result must be combined with clinical observations, patient history, and epidemiological information. The expected result is Negative.  Fact Sheet for Patients:  bloggercourse.com  Fact Sheet for Healthcare Providers:  seriousbroker.it  This test is no t yet approved or cleared by the [place]  FDA and  has been authorized for detection and/or diagnosis of SARS-CoV-2 by FDA under an Emergency Use Authorization (EUA). This EUA will remain  in effect (meaning this test can be used) for the duration of the COVID-19 declaration under Section 564(b)(1) of the Act, 21 U.S.C.section 360bbb-3(b)(1), unless the authorization is terminated  or revoked sooner.       Influenza A by PCR NEGATIVE NEGATIVE Final   Influenza B by PCR NEGATIVE NEGATIVE Final    Comment: (NOTE) The Xpert Xpress SARS-CoV-2/FLU/RSV plus assay is intended as an aid in the diagnosis of influenza from Nasopharyngeal swab specimens and should not be used as a sole basis for treatment. Nasal washings and aspirates are unacceptable for Xpert Xpress SARS-CoV-2/FLU/RSV testing.  Fact Sheet for Patients: bloggercourse.com  Fact Sheet for Healthcare Providers: seriousbroker.it  This test is not yet approved or cleared by the [place]  FDA and has been authorized for detection and/or diagnosis of SARS-CoV-2 by FDA under an Emergency Use Authorization (EUA). This EUA will remain in effect (meaning this test can be used) for the duration of the COVID-19 declaration under Section 564(b)(1) of the Act, 21 U.S.C. section 360bbb-3(b)(1), unless the authorization is terminated or revoked.     Resp Syncytial Virus by PCR NEGATIVE NEGATIVE Final    Comment: (NOTE) Fact Sheet for Patients: bloggercourse.com  Fact Sheet for Healthcare  Providers: seriousbroker.it  This test is not yet approved or cleared by the [place]  FDA and has been authorized for detection and/or diagnosis of SARS-CoV-2 by FDA under an Emergency Use Authorization (EUA). This EUA will remain in effect (meaning this test can be used) for the duration of the COVID-19 declaration under Section 564(b)(1) of the Act, 21 U.S.C. section 360bbb-3(b)(1), unless the authorization is terminated or revoked.  Performed at [facility], [address] [place], [place] [address]   Respiratory (~20 pathogens) panel by PCR     Status: None   Collection Time: 05/26/23 10:12 PM   Specimen: Nasopharyngeal Swab; Respiratory  Result Value Ref Range Status   Adenovirus NOT DETECTED NOT DETECTED Final   Coronavirus 229E NOT DETECTED NOT DETECTED Final    Comment: (NOTE) The Coronavirus on the Respiratory Panel, DOES NOT test for the novel  Coronavirus (2019 nCoV)    Coronavirus HKU1 NOT DETECTED NOT DETECTED Final  Coronavirus NL63 NOT DETECTED NOT DETECTED Final   Coronavirus OC43 NOT DETECTED NOT DETECTED Final   Metapneumovirus NOT DETECTED NOT DETECTED Final   Rhinovirus / Enterovirus NOT DETECTED NOT DETECTED Final   Influenza A NOT DETECTED NOT DETECTED Final   Influenza B NOT DETECTED NOT DETECTED Final   Parainfluenza Virus 1 NOT DETECTED NOT DETECTED Final   Parainfluenza Virus 2 NOT DETECTED NOT DETECTED Final   Parainfluenza Virus 3 NOT DETECTED NOT DETECTED Final   Parainfluenza Virus 4 NOT DETECTED NOT DETECTED Final   Respiratory Syncytial Virus NOT DETECTED NOT DETECTED Final   Bordetella pertussis NOT DETECTED NOT DETECTED Final   Bordetella Parapertussis NOT DETECTED NOT DETECTED Final   Chlamydophila pneumoniae NOT DETECTED NOT DETECTED Final   Mycoplasma pneumoniae NOT DETECTED NOT DETECTED Final    Comment: Performed at [facility], [address], [place], [place] [address]     Labs: BNP  (last 3 results) Recent Labs    05/26/23 1258  BNP 82.6   Basic Metabolic Panel: Recent Labs  Lab 05/26/23 1258 05/26/23 1516 05/27/23 0344  NA 134*  --  137  K 4.2  --  4.6  CL 104  --  110  CO2 21*  --  22  GLUCOSE 234*  --  92  BUN 34*  --  27*  CREATININE 1.82*  --  1.47*  CALCIUM  9.1  --  8.2*  MG  --  1.6*  --   PHOS  --  2.8  --    Liver Function Tests: Recent Labs  Lab 05/26/23 1258  AST 37  ALT 22  ALKPHOS 55  BILITOT 0.6  PROT 7.4  ALBUMIN  4.3   No results for input(s): LIPASE, AMYLASE in the last 168 hours. No results for input(s): AMMONIA in the last 168 hours. CBC: Recent Labs  Lab 05/26/23 1258 05/27/23 0344  WBC 3.5* 3.7*  NEUTROABS 1.5*  --   HGB 7.6* 7.3*  HCT 22.7* 20.4*  MCV 88.7 83.6  PLT 141* 135*   Cardiac Enzymes: No results for input(s): CKTOTAL, CKMB, CKMBINDEX, TROPONINI in the last 168 hours. BNP: Invalid input(s): POCBNP CBG: Recent Labs  Lab 05/27/23 0348 05/27/23 0733 05/27/23 1201 05/27/23 1323 05/27/23 1520  GLUCAP 88 112* 420* 517* 348*   D-Dimer No results for input(s): DDIMER in the last 72 hours. Hgb A1c No results for input(s): HGBA1C in the last 72 hours. Lipid Profile No results for input(s): CHOL, HDL, LDLCALC, TRIG, CHOLHDL, LDLDIRECT in the last 72 hours. Thyroid  function studies Recent Labs    05/26/23 1516  TSH 3.022   Anemia work up No results for input(s): VITAMINB12, FOLATE, FERRITIN, TIBC, IRON , RETICCTPCT in the last 72 hours. Urinalysis    Component Value Date/Time   COLORURINE COLORLESS (A) 05/27/2023 0125   APPEARANCEUR CLEAR (A) 05/27/2023 0125   APPEARANCEUR Clear 09/18/2020 1153   LABSPEC 1.005 05/27/2023 0125   LABSPEC 1.026 03/01/2013 1844   PHURINE 5.0 05/27/2023 0125   GLUCOSEU 50 (A) 05/27/2023 0125   GLUCOSEU >=500 03/01/2013 1844   HGBUR SMALL (A) 05/27/2023 0125   BILIRUBINUR NEGATIVE 05/27/2023 0125   BILIRUBINUR neg  10/23/2020 0953   BILIRUBINUR Negative 09/18/2020 1153   BILIRUBINUR Negative 03/01/2013 1844   KETONESUR NEGATIVE 05/27/2023 0125   PROTEINUR NEGATIVE 05/27/2023 0125   UROBILINOGEN 0.2 10/23/2020 0953   NITRITE NEGATIVE 05/27/2023 0125   LEUKOCYTESUR TRACE (A) 05/27/2023 0125   LEUKOCYTESUR Negative 03/01/2013 1844   Sepsis Labs  Recent Labs  Lab 05/26/23 1258 05/27/23 0344  WBC 3.5* 3.7*   Microbiology Recent Results (from the past 240 hours)  Blood culture (single)     Status: None (Preliminary result)   Collection Time: 05/26/23 12:58 PM   Specimen: BLOOD  Result Value Ref Range Status   Specimen Description BLOOD BLOOD LEFT ARM  Final   Special Requests   Final    BOTTLES DRAWN AEROBIC AND ANAEROBIC Blood Culture results may not be optimal due to an inadequate volume of blood received in culture bottles   Culture   Final    NO GROWTH < 24 HOURS Performed at [facility], [address], [place], [place] [address]    Report Status PENDING  Incomplete  Resp panel by RT-PCR (RSV, Flu A&B, Covid) Anterior Nasal Swab     Status: None   Collection Time: 05/26/23  3:16 PM   Specimen: Anterior Nasal Swab  Result Value Ref Range Status   SARS Coronavirus 2 by RT PCR NEGATIVE NEGATIVE Final    Comment: (NOTE) SARS-CoV-2 target nucleic acids are NOT DETECTED.  The SARS-CoV-2 RNA is generally detectable in upper respiratory specimens during the acute phase of infection. The lowest concentration of SARS-CoV-2 viral copies this assay can detect is 138 copies/mL. A negative result does not preclude SARS-Cov-2 infection and should not be used as the sole basis for treatment or other patient management decisions. A negative result may occur with  improper specimen collection/handling, submission of specimen other than nasopharyngeal swab, presence of viral mutation(s) within the areas targeted by this assay, and inadequate number of viral copies(<138 copies/mL). A  negative result must be combined with clinical observations, patient history, and epidemiological information. The expected result is Negative.  Fact Sheet for Patients:  bloggercourse.com  Fact Sheet for Healthcare Providers:  seriousbroker.it  This test is no t yet approved or cleared by the [place]  FDA and  has been authorized for detection and/or diagnosis of SARS-CoV-2 by FDA under an Emergency Use Authorization (EUA). This EUA will remain  in effect (meaning this test can be used) for the duration of the COVID-19 declaration under Section 564(b)(1) of the Act, 21 U.S.C.section 360bbb-3(b)(1), unless the authorization is terminated  or revoked sooner.       Influenza A by PCR NEGATIVE NEGATIVE Final   Influenza B by PCR NEGATIVE NEGATIVE Final    Comment: (NOTE) The Xpert Xpress SARS-CoV-2/FLU/RSV plus assay is intended as an aid in the diagnosis of influenza from Nasopharyngeal swab specimens and should not be used as a sole basis for treatment. Nasal washings and aspirates are unacceptable for Xpert Xpress SARS-CoV-2/FLU/RSV testing.  Fact Sheet for Patients: bloggercourse.com  Fact Sheet for Healthcare Providers: seriousbroker.it  This test is not yet approved or cleared by the [place]  FDA and has been authorized for detection and/or diagnosis of SARS-CoV-2 by FDA under an Emergency Use Authorization (EUA). This EUA will remain in effect (meaning this test can be used) for the duration of the COVID-19 declaration under Section 564(b)(1) of the Act, 21 U.S.C. section 360bbb-3(b)(1), unless the authorization is terminated or revoked.     Resp Syncytial Virus by PCR NEGATIVE NEGATIVE Final    Comment: (NOTE) Fact Sheet for Patients: bloggercourse.com  Fact Sheet for Healthcare  Providers: seriousbroker.it  This test is not yet approved or cleared by the [place]  FDA and has been authorized for detection and/or diagnosis of SARS-CoV-2 by FDA under an Emergency Use Authorization (EUA). This EUA  will remain in effect (meaning this test can be used) for the duration of the COVID-19 declaration under Section 564(b)(1) of the Act, 21 U.S.C. section 360bbb-3(b)(1), unless the authorization is terminated or revoked.  Performed at [facility], [address] [place], [place] [address]   Respiratory (~20 pathogens) panel by PCR     Status: None   Collection Time: 05/26/23 10:12 PM   Specimen: Nasopharyngeal Swab; Respiratory  Result Value Ref Range Status   Adenovirus NOT DETECTED NOT DETECTED Final   Coronavirus 229E NOT DETECTED NOT DETECTED Final    Comment: (NOTE) The Coronavirus on the Respiratory Panel, DOES NOT test for the novel  Coronavirus (2019 nCoV)    Coronavirus HKU1 NOT DETECTED NOT DETECTED Final   Coronavirus NL63 NOT DETECTED NOT DETECTED Final   Coronavirus OC43 NOT DETECTED NOT DETECTED Final   Metapneumovirus NOT DETECTED NOT DETECTED Final   Rhinovirus / Enterovirus NOT DETECTED NOT DETECTED Final   Influenza A NOT DETECTED NOT DETECTED Final   Influenza B NOT DETECTED NOT DETECTED Final   Parainfluenza Virus 1 NOT DETECTED NOT DETECTED Final   Parainfluenza Virus 2 NOT DETECTED NOT DETECTED Final   Parainfluenza Virus 3 NOT DETECTED NOT DETECTED Final   Parainfluenza Virus 4 NOT DETECTED NOT DETECTED Final   Respiratory Syncytial Virus NOT DETECTED NOT DETECTED Final   Bordetella pertussis NOT DETECTED NOT DETECTED Final   Bordetella Parapertussis NOT DETECTED NOT DETECTED Final   Chlamydophila pneumoniae NOT DETECTED NOT DETECTED Final   Mycoplasma pneumoniae NOT DETECTED NOT DETECTED Final    Comment: Performed at [facility], [address], [place], [place] [address]     Time  coordinating discharge: Over 30 minutes  SIGNED:   Calvin KATHEE Robson, MD  [facility] 05/27/2023, 5:01 PM Pager   If 7PM-7AM, please contact night-coverage

## 2023-05-27 NOTE — Progress Notes (Signed)
 Initial Nutrition Assessment  DOCUMENTATION CODES:   Non-severe (moderate) malnutrition in context of chronic illness  INTERVENTION:   -Liberalize diet to carb modified for wider variety of meal selections -MVI with minerals daily -Glucerna Shake po TID, each supplement provides 220 kcal and 10 grams of protein   Nocturnal tube feeds via j-tube:    Glucerna 1.5@70ml /hr x 14 hrs overnight (1800-0800)   Free water  flushes 50ml q4 hours to maintain tube patency    Regimen provides 1470kcal/day, 81g/day protein and 1041ml/day of free water .   NUTRITION DIAGNOSIS:   Moderate Malnutrition related to chronic illness (DM and gastroapresis) as evidenced by percent weight loss, mild fat depletion, moderate fat depletion, mild muscle depletion, moderate muscle depletion.  GOAL:   Patient will meet greater than or equal to 90% of their needs  MONITOR:   PO intake, Supplement acceptance, TF tolerance  REASON FOR ASSESSMENT:   Consult Assessment of nutrition requirement/status, Enteral/tube feeding initiation and management  ASSESSMENT:   Pt with history of IDDM1, history of adrenal insufficiency, gastroparesis, malnutrition status post J-tube placement, dilated cardiomyopathy, normocytic anemia, history of DVT, currently not on anticoagulation, who presented for chief concerns of hypotension from outpatient cardiology clinic.  Pt admitted with hypotension.   Reviewed I/O's: +500 ml x 24 hours   Per H&P, pt not taking her two doses of cortef  routinely. She reports she is receiving TF, however, pt historically has been noncompliant with TF infusions due to her daughter's unpredictable sleep schedule and history of daughter pulling at j-tube site.   Pt very familiar to this RD due to multiple prior admissions. Pt with a complicated social situation, which is her largest barrier to care. She often has difficulty attending medical appointments due to lack of childcare. She also reports  she has been recommended G-POEM procedure by GI, however, she has been unable to proceed as this procedure is not available locally and is unable to obtain transportation to appointment to [facility] or [place]. She has been unable to obtain endocrinology follow-up due to expired referral and has had difficulty contacting office to change her PCP (current PCP is in [place] and pt does not have the resources to be transported there). Pt shares that she infuses TF intermittently at home and sites her daughter's sleep schedule and recently her being sick as barriers to compliance   Reviewed wt hx; pt has experienced a 13.2% wt loss over the past 2 months, which is significant for time frame. Given pt's wt loss, question compliance of TF regimen at home.   Noted TF has been ordered, however, has not been given due to being held in ED.   Pt sleeping soundly at time of visit. She did not respond to voice or touch.   Medications reviewed and include cortef  and protamine.   Lab Results  Component Value Date   HGBA1C 10.3 (H) 05/10/2023   PTA DM medications are 2 units insulin  aspart TID and 6-7 units insulin  glargine daily.   Labs reviewed: Mg: 1.6, CBGS: 88-517 (inpatient orders for glycemic control are 0-6 units insulin  aspart every 4 hours, 4 units insulin  glargine-yfgn daily).  DM following for hypoglycemia; pt is very brittle and sensitive to insulin , making CBGS difficult to manage.   NUTRITION - FOCUSED PHYSICAL EXAM:  Flowsheet Row Most Recent Value  Orbital Region Mild depletion  Upper Arm Region Moderate depletion  Thoracic and Lumbar Region No depletion  Buccal Region Mild depletion  Temple Region Mild depletion  Clavicle Bone Region  Moderate depletion  Clavicle and Acromion Bone Region Moderate depletion  Scapular Bone Region Moderate depletion  Dorsal Hand Mild depletion  Patellar Region Mild depletion  Anterior Thigh Region Mild depletion  Posterior Calf Region Mild depletion   Edema (RD Assessment) None  Hair Reviewed  Eyes Reviewed  Mouth Reviewed  Skin Reviewed  Nails Reviewed       Diet Order:   Diet Order             Diet Carb Modified Fluid consistency: Thin  Diet effective now                   EDUCATION NEEDS:   No education needs have been identified at this time  Skin:  Skin Assessment: Reviewed RN Assessment  Last BM:  05/25/23  Height:   Ht Readings from Last 1 Encounters:  05/26/23 5' 1 (1.549 m)    Weight:   Wt Readings from Last 1 Encounters:  05/26/23 41.3 kg    Ideal Body Weight:  47.7 kg  BMI:  Body mass index is 17.19 kg/m.  Estimated Nutritional Needs:   Kcal:  1650-1850  Protein:  85-100 grams  Fluid:  > 1.6 L    Margery ORN, RD, LDN, CDCES Registered Dietitian III Certified Diabetes Care and Education Specialist If unable to reach this RD, please use RD Inpatient group chat on secure chat between hours of 8am-4 pm daily

## 2023-05-27 NOTE — ED Notes (Signed)
 Pt utilizes bedside commode for urinary occurrence.

## 2023-05-27 NOTE — ED Notes (Signed)
 This RN notified the floor RN of the CBG of 420. Per MD, give 6 units of novolog  and the Semglee  and then recheck CBG in an hour. This RN gave 6 units of novolog  and this RN is currently waiting on Semglee  to come from pharmacy. Patient has a ready bed, so CBG recheck will be at 1315. Floor RN made aware at this time.

## 2023-05-27 NOTE — Progress Notes (Signed)
 Discharge summary given to patient. Patient escorted in wheelchair down for discharge.   Madie Reno, RN

## 2023-05-28 ENCOUNTER — Other Ambulatory Visit: Payer: Self-pay | Admitting: Physician Assistant

## 2023-05-28 DIAGNOSIS — E109 Type 1 diabetes mellitus without complications: Secondary | ICD-10-CM

## 2023-05-30 NOTE — Progress Notes (Signed)
 Please, let pt know that her labs results were unclear due to an inadequate volume of blood sample. We will redo labs when she will FOLLOW-UP with me

## 2023-05-31 ENCOUNTER — Telehealth: Payer: Self-pay | Admitting: Physician Assistant

## 2023-05-31 DIAGNOSIS — E109 Type 1 diabetes mellitus without complications: Secondary | ICD-10-CM

## 2023-05-31 LAB — CULTURE, BLOOD (SINGLE): Culture: NO GROWTH

## 2023-05-31 NOTE — Telephone Encounter (Signed)
 The patient states the wrong prescription was called in. She states the prescription was written for the vial for NOVOLOG  100 UNIT/ML injection instead of the flex pen which she needs. Please assist patient by sending new script to   [id number] [place], [place] - [address] Phone: [phone]  Fax: [phone]

## 2023-06-01 MED ORDER — NOVOLOG FLEXPEN 100 UNIT/ML ~~LOC~~ SOPN: PEN_INJECTOR | SUBCUTANEOUS | 11 refills | Status: DC

## 2023-06-06 LAB — BLOOD GAS, VENOUS
Acid-base deficit: 3.9 mmol/L — ABNORMAL HIGH (ref 0.0–2.0)
Bicarbonate: 24.1 mmol/L (ref 20.0–28.0)
O2 Saturation: 17.7 %
Patient temperature: 37
pCO2, Ven: 55 mm[Hg] (ref 44–60)
pH, Ven: 7.25 (ref 7.25–7.43)

## 2023-06-07 ENCOUNTER — Ambulatory Visit: Payer: Medicaid Other | Admitting: Physician Assistant

## 2023-06-08 DIAGNOSIS — E1042 Type 1 diabetes mellitus with diabetic polyneuropathy: Secondary | ICD-10-CM | POA: Diagnosis not present

## 2023-06-08 DIAGNOSIS — E039 Hypothyroidism, unspecified: Secondary | ICD-10-CM | POA: Diagnosis not present

## 2023-06-08 DIAGNOSIS — E2740 Unspecified adrenocortical insufficiency: Secondary | ICD-10-CM | POA: Diagnosis not present

## 2023-06-08 DIAGNOSIS — N184 Chronic kidney disease, stage 4 (severe): Secondary | ICD-10-CM | POA: Diagnosis not present

## 2023-06-08 DIAGNOSIS — I5020 Unspecified systolic (congestive) heart failure: Secondary | ICD-10-CM | POA: Diagnosis not present

## 2023-06-19 ENCOUNTER — Other Ambulatory Visit: Payer: Self-pay | Admitting: Physician Assistant

## 2023-06-19 DIAGNOSIS — E038 Other specified hypothyroidism: Secondary | ICD-10-CM

## 2023-06-20 ENCOUNTER — Ambulatory Visit: Payer: Medicaid Other | Admitting: Physician Assistant

## 2023-06-20 NOTE — Telephone Encounter (Signed)
Requested Prescriptions  Pending Prescriptions Disp Refills   levothyroxine (SYNTHROID) 75 MCG tablet [Pharmacy Med Name: LEVOTHYROXINE 75 MCG TABLET] 90 tablet 0    Sig: TAKE 1 TABLET (75 MCG TOTAL) BY MOUTH DAILY AT 6 (SIX) AM.     Endocrinology:  Hypothyroid Agents Passed - 06/20/2023 12:09 PM      Passed - TSH in normal range and within 360 days    TSH  Date Value Ref Range Status  05/26/2023 3.022 0.350 - 4.500 uIU/mL Final    Comment:    Performed by a 3rd Generation assay with a functional sensitivity of <=0.01 uIU/mL. Performed at [facility], [address] [place], [place] [address]   05/10/2023 5.560 (H) 0.450 - 4.500 uIU/mL Final         Passed - Valid encounter within last 12 months    Recent Outpatient Visits           1 month ago HFrEF (heart failure with reduced ejection fraction) [facility])   [facility] [place], [place], PA-C       Future Appointments             In 1 week Debera Lat, PA-C [facility], [place]

## 2023-06-25 DIAGNOSIS — Z419 Encounter for procedure for purposes other than remedying health state, unspecified: Secondary | ICD-10-CM | POA: Diagnosis not present

## 2023-06-29 ENCOUNTER — Ambulatory Visit: Payer: Medicaid Other | Admitting: Surgery

## 2023-06-30 ENCOUNTER — Ambulatory Visit: Payer: Self-pay | Admitting: Physician Assistant

## 2023-07-04 ENCOUNTER — Ambulatory Visit: Payer: Medicaid Other

## 2023-07-06 ENCOUNTER — Ambulatory Visit: Payer: Medicaid Other | Admitting: Surgery

## 2023-07-18 ENCOUNTER — Ambulatory Visit (INDEPENDENT_AMBULATORY_CARE_PROVIDER_SITE_OTHER): Payer: Medicaid Other | Admitting: Surgery

## 2023-07-18 ENCOUNTER — Encounter: Payer: Self-pay | Admitting: Surgery

## 2023-07-18 VITALS — BP 160/88 | HR 88 | Temp 98.0°F | Ht 61.0 in | Wt 106.2 lb

## 2023-07-18 DIAGNOSIS — Z434 Encounter for attention to other artificial openings of digestive tract: Secondary | ICD-10-CM

## 2023-07-18 DIAGNOSIS — E43 Unspecified severe protein-calorie malnutrition: Secondary | ICD-10-CM

## 2023-07-18 DIAGNOSIS — Z934 Other artificial openings of gastrointestinal tract status: Secondary | ICD-10-CM

## 2023-07-18 NOTE — Progress Notes (Signed)
 Outpatient Surgical Follow Up  07/18/2023  Ashley Camacho is an 33 y.o. female.   Chief Complaint  Patient presents with   Follow-up    J tube clogged    HPI: 33 year old very pleasant female 1 year out from jejunostomy tube.  Became dislodged and was replaced.  Tube is working well.  No fevers no chills.  She is tolerating tube feeds and she is gaining weight , Chronic drainage that is appropriate from tube  Past Medical History:  Diagnosis Date   Acute metabolic encephalopathy 06/10/2022   Adrenal insufficiency (HCC)    Anemia    CHF (congestive heart failure) (HCC)    Gastroparesis    Hypertension    Hypotension    Intractable nausea and vomiting 11/13/2022   Migraine    Thyroid disease    Type 1 diabetes (HCC)     Past Surgical History:  Procedure Laterality Date   BIOPSY  01/14/2021   Procedure: BIOPSY;  Surgeon: Tressia Danas, MD;  Location: [facility] ENDOSCOPY;  Service: Gastroenterology;;   CESAREAN SECTION     x2   CESAREAN SECTION WITH BILATERAL TUBAL LIGATION N/A 02/17/2021   Procedure: CESAREAN SECTION WITH BILATERAL TUBAL LIGATION;  Surgeon: [name], DO;  Location: [facility] LD ORS;  Service: Obstetrics;  Laterality: N/A;   ESOPHAGOGASTRODUODENOSCOPY (EGD) WITH PROPOFOL N/A 01/14/2021   Procedure: ESOPHAGOGASTRODUODENOSCOPY (EGD) WITH PROPOFOL;  Surgeon: Tressia Danas, MD;  Location: [facility] ENDOSCOPY;  Service: Gastroenterology;  Laterality: N/A;   IR BONE MARROW BIOPSY & ASPIRATION  12/16/2022   IR REPLC DUODEN/JEJUNO TUBE PERCUT W/FLUORO  11/22/2022   JEJUNOSTOMY N/A 06/13/2022   Procedure: JEJUNOSTOMY;  Surgeon: Leafy Ro, MD;  Location: [facility] ORS;  Service: General;  Laterality: N/A;   MOUTH SURGERY      Family History  Problem Relation Age of Onset   Breast cancer Mother    Seizures Mother    Diabetes type I Father    CAD Father    Lung cancer Maternal Grandfather    CAD Paternal Grandmother    CAD Paternal Grandfather    Ovarian cancer Neg  Hx     Social History:  reports that she has never smoked. She has never been exposed to tobacco smoke. She has never used smokeless tobacco. She reports that she does not drink alcohol and does not use drugs.  Allergies: No Known Allergies  Medications reviewed.    ROS Full ROS performed and is otherwise negative other than what is stated in HPI   BP (!) 160/88   Pulse 88   Temp 98 F (36.7 C) (Oral)   Ht 5\' 1"  (1.549 m)   Wt 106 lb 3.2 oz (48.2 kg)   SpO2 99%   BMI 20.07 kg/m   Physical Exam NAD alert Abd: soft, jejunostomy tube in place, I push down the bolster, no infection. Patent No peritonitis Ext: well perfused   Assessment/Plan: 33 year old female with severe gastroparesis and malnutrition now doing better given appropriate feeds via jejunostomy.  No evidence of complications related to the jejunostomy.  I will be happy to follow her up on an outpatient basis.  Please note that I spent 20 minutes in this encounter including reviewing medical records, counseling the patient and performing documentation  Sterling Big, MD [facility] General Surgeon

## 2023-07-18 NOTE — Patient Instructions (Signed)
 How to Care for a Feeding Tube A feeding tube is a soft, flexible tube used to give medicine, water, and liquid food. A person may need a feeding tube if they have trouble swallowing or cannot have food or medicine by mouth. The tube is put right into the stomach.  The following information gives steps on how to care for the skin around a feeding tube, or the tube site. This information is meant for adults and children over 33 year of age. Supplies needed: Clean washcloth, gauze pads, or soft paper towels. Cotton swabs. Skin barrier ointment or cream, such as petroleum jelly. Soap and water, or sterile saline. Pre-cut foam pads or gauze for around the tube. Medical tape. Anchoring device. This is not always used. Syringe. Cleaning brush or toothbrush. This is only used for cleanings. How to care for the tube site  Have all supplies ready and near you. Wash your hands with soap and water for at least 20 seconds. Remove the foam pad or gauze under the tube stabilizing disc (bumper), if there is one. You may have to do this a few times a day right after the feeding tube is put in because the gauze or pad will get soiled or wet. As the site heals, you may not have to replace the pads or gauze as often. But you still need to clean and check the area every day. Gently turn the bumper so it does not stick to the skin. Check the skin around the tube site for redness, rash, swelling, drainage, or growth of extra tissue. Check the number on the tube (guide mark) where it meets the skin. It should not change. If it does, the feeding tube might be coming out. Moisten gauze pads and cotton swabs with water and soap, or saline. Take the moistenedcotton swab and wipe under the bumper, right near the opening in the belly (stoma). Take the moistened gauze pad and clean the skin around the tube site. If you used soap, rinse with water. Use a washcloth, dry gauze pad, or soft paper towel to dry the skin and  stoma site. Dry the tube and bumper too. If the skin is red, put a barrier cream or ointment on a cotton swab. Apply it around the site, under the bumper. This will help the site heal. Put a new pre-cut foam pad or gauze around the tube, under the bumper. If the site is healed and there is no drainage, you can leave off the foam pads or gauze. Put tape around the edges of the foam pad or gauze to keep it in place. Use tape or an anchoring device to hold the loose end of the tube against the skin. This helps keep the tube from getting pulled on. Change where you put the tape to avoid damaging the skin. Throw away used supplies. Wash your hands with soap and water for at least 20 seconds. How to care for the moat If the end of the feeding tube has a moat, clean it once a day or as told. The moat is the open space inside the feeding tube connector. Follow the manufacturer's instructions on how to clean the moat. You may be told to: Remove the cap from the end of the feeding tube. Cover the hole in the middle of the tube port with a cleaning brush. Hold the end of the tube over a deep bowl, or wrap it with paper towels or a washcloth to soak up the water. Use  a syringe of water to flush out the moat. Use the cleaning brush or toothbrush to clean the tube cap and around the inside of the moat. This brush can only be used for tube cleanings. Dry the end of the feeding tube and the cap with gauze or paper towel. Put the cap back on the feeding tube. General tips Use, clean, and reuse feeding tube equipment only as told by the health care provider. Do not use antibiotic ointment or cream on the feeding tube site unless you're told to. Contact a health care provider if: You notice a change in the guide marks on the feeding tube where it meets the skin. Changes could mean the feeding tube has moved or is coming out. You see any of these on the skin around the tube  site: Redness. Rash. Swelling. Drainage. Extra growth of tissue. You have questions or concerns about the feeding tube or the tube site. This information is not intended to replace advice given to you by your health care provider. Make sure you discuss any questions you have with your health care provider. Document Revised: 09/09/2022 Document Reviewed: 09/09/2022 Elsevier Patient Education  2024 [employer].

## 2023-07-20 ENCOUNTER — Ambulatory Visit: Payer: Medicaid Other | Attending: Student

## 2023-07-20 DIAGNOSIS — I5042 Chronic combined systolic (congestive) and diastolic (congestive) heart failure: Secondary | ICD-10-CM

## 2023-07-20 DIAGNOSIS — Z8759 Personal history of other complications of pregnancy, childbirth and the puerperium: Secondary | ICD-10-CM

## 2023-07-20 DIAGNOSIS — O903 Peripartum cardiomyopathy: Secondary | ICD-10-CM

## 2023-07-20 LAB — ECHOCARDIOGRAM COMPLETE
AV Mean grad: 5 mm[Hg]
AV Peak grad: 8.2 mm[Hg]
Ao pk vel: 1.43 m/s
Area-P 1/2: 3.85 cm2
S' Lateral: 3.25 cm

## 2023-07-21 ENCOUNTER — Ambulatory Visit: Payer: Medicaid Other | Admitting: Physician Assistant

## 2023-07-23 ENCOUNTER — Emergency Department

## 2023-07-23 ENCOUNTER — Emergency Department
Admission: EM | Admit: 2023-07-23 | Discharge: 2023-07-23 | Disposition: A | Discharge status: Home / Self Care | Attending: Emergency Medicine | Admitting: Emergency Medicine

## 2023-07-23 ENCOUNTER — Other Ambulatory Visit: Payer: Self-pay

## 2023-07-23 DIAGNOSIS — R944 Abnormal results of kidney function studies: Secondary | ICD-10-CM | POA: Insufficient documentation

## 2023-07-23 DIAGNOSIS — E119 Type 2 diabetes mellitus without complications: Secondary | ICD-10-CM | POA: Insufficient documentation

## 2023-07-23 DIAGNOSIS — I509 Heart failure, unspecified: Secondary | ICD-10-CM | POA: Insufficient documentation

## 2023-07-23 DIAGNOSIS — Z4659 Encounter for fitting and adjustment of other gastrointestinal appliance and device: Secondary | ICD-10-CM | POA: Insufficient documentation

## 2023-07-23 DIAGNOSIS — Z794 Long term (current) use of insulin: Secondary | ICD-10-CM | POA: Insufficient documentation

## 2023-07-23 DIAGNOSIS — D649 Anemia, unspecified: Secondary | ICD-10-CM | POA: Diagnosis not present

## 2023-07-23 DIAGNOSIS — K3184 Gastroparesis: Secondary | ICD-10-CM | POA: Insufficient documentation

## 2023-07-23 DIAGNOSIS — K9423 Gastrostomy malfunction: Secondary | ICD-10-CM | POA: Diagnosis not present

## 2023-07-23 DIAGNOSIS — Z419 Encounter for procedure for purposes other than remedying health state, unspecified: Secondary | ICD-10-CM | POA: Diagnosis not present

## 2023-07-23 DIAGNOSIS — N289 Disorder of kidney and ureter, unspecified: Secondary | ICD-10-CM | POA: Diagnosis not present

## 2023-07-23 DIAGNOSIS — K8689 Other specified diseases of pancreas: Secondary | ICD-10-CM | POA: Diagnosis not present

## 2023-07-23 LAB — BASIC METABOLIC PANEL
Anion gap: 9 (ref 5–15)
BUN: 49 mg/dL — ABNORMAL HIGH (ref 6–20)
CO2: 21 mmol/L — ABNORMAL LOW (ref 22–32)
Calcium: 9 mg/dL (ref 8.9–10.3)
Chloride: 112 mmol/L — ABNORMAL HIGH (ref 98–111)
Creatinine, Ser: 1.98 mg/dL — ABNORMAL HIGH (ref 0.44–1.00)
GFR, Estimated: 34 mL/min — ABNORMAL LOW (ref >60)
Glucose, Bld: 69 mg/dL — ABNORMAL LOW (ref 70–99)
Potassium: 4.8 mmol/L (ref 3.5–5.1)
Sodium: 142 mmol/L (ref 135–145)

## 2023-07-23 LAB — CBC
HCT: 29.3 % — ABNORMAL LOW (ref 36.0–46.0)
Hemoglobin: 9.8 g/dL — ABNORMAL LOW (ref 12.0–15.0)
MCH: 32 pg (ref 26.0–34.0)
MCHC: 33.4 g/dL (ref 30.0–36.0)
MCV: 95.8 fL (ref 80.0–100.0)
Platelets: 189 10*3/uL (ref 150–400)
RBC: 3.06 MIL/uL — ABNORMAL LOW (ref 3.87–5.11)
RDW: 13.1 % (ref 11.5–15.5)
WBC: 6.6 10*3/uL (ref 4.0–10.5)
nRBC: 0 % (ref 0.0–0.2)

## 2023-07-23 LAB — POC URINE PREG, ED: Preg Test, Ur: NEGATIVE

## 2023-07-23 NOTE — ED Provider Notes (Signed)
 [facility] Provider Note    Event Date/Time   First MD Initiated Contact with Patient 07/23/23 1514     (approximate)   History   No chief complaint on file.   HPI  Ashley Camacho is a 33 y.o. female who presents today after gave to came out this morning.  Patient has a 2 in January 2024 due to gastroparesis secondary to diabetes mellitus type 2.  Patient was seen by Dr. Lowell Guitar on last Tuesday who reinserted the tube.  Patient states she experienced left upper quadrant pain that increased with deep breathing, and left lower quadrant pain around the tube.  Patient is not getting her feeds at nights.  Patient states history of renal failure, cardiac heart failure.  Patient is using insulin.  Patient denies fever nauseas, vomit cough.      Physical Exam   Triage Vital Signs: ED Triage Vitals  Encounter Vitals Group     BP 07/23/23 1346 130/83     Systolic BP Percentile --      Diastolic BP Percentile --      Pulse Rate 07/23/23 1346 89     Resp 07/23/23 1346 16     Temp 07/23/23 1346 97.8 F (36.6 C)     Temp Source 07/23/23 1346 Oral     SpO2 07/23/23 1346 100 %     Weight 07/23/23 1347 106 lb (48.1 kg)     Height 07/23/23 1347 5\' 1"  (1.549 m)     Head Circumference --      Peak Flow --      Pain Score 07/23/23 1347 6     Pain Loc --      Pain Education --      Exclude from Growth Chart --     Most recent vital signs: Vitals:   07/23/23 1346  BP: 130/83  Pulse: 89  Resp: 16  Temp: 97.8 F (36.6 C)  SpO2: 100%     Constitutional: Alert nondistressed Eyes: Conjunctivae are normal.  Head: Atraumatic. Nose: No congestion/rhinnorhea. Mouth/Throat: Mucous membranes are moist.   Neck: Painless ROM.  Cardiovascular:   Good peripheral circulation. Respiratory: Normal respiratory effort.  No retractions.  Left 8 and  9 ribs tender to palpation between  left middle clavicular line and left middle axillary line Gastrointestinal: Soft and  nontender.  Right hypochondrial ecchymosis for insulin injections.  Left hypochondrium: Incision of 2 cm open, white drainage, erythema around, tender to palpation.  Musculoskeletal:  no deformity Neurologic:  MAE spontaneously. No gross focal neurologic deficits are appreciated.  Skin:  Skin is warm, dry and intact. No rash noted. Psychiatric: Mood and affect are normal. Speech and behavior are normal.    ED Results / Procedures / Treatments   Labs (all labs ordered are listed, but only abnormal results are displayed) Labs Reviewed  BASIC METABOLIC PANEL - Abnormal; Notable for the following components:      Result Value   Chloride 112 (*)    CO2 21 (*)    Glucose, Bld 69 (*)    BUN 49 (*)    Creatinine, Ser 1.98 (*)    GFR, Estimated 34 (*)    All other components within normal limits  CBC - Abnormal; Notable for the following components:   RBC 3.06 (*)    Hemoglobin 9.8 (*)    HCT 29.3 (*)    All other components within normal limits  POC URINE PREG, ED     EKG  See physician read    RADIOLOGY I independently reviewed and interpreted imaging and agree with radiologists findings.      PROCEDURES:  Critical Care performed:   Procedures   MEDICATIONS ORDERED IN ED: Medications - No data to display   IMPRESSION / MDM / ASSESSMENT AND PLAN / ED COURSE  I reviewed the triage vital signs and the nursing notes.  Differential diagnosis includes, but is not limited to, abscess, cellulitis, perforation  Patient's presentation is most consistent with acute complicated illness / injury requiring diagnostic workup.   Patient's diagnosis is consistent with J-tube removed. I independently reviewed and interpreted imaging and agree with radiologists findings. Labs are  reassuring.  Patient had BUN and creatinine elevated, GFR decreased that can be due to missing the feeds at night.when patient arrived to triage blood was drawn for BMP showing glucose of 69, patient stated  she got  insulin before coming to ED. Patient was wearing Dexcom CGM glucose was 197.  Patient stated she did not want the J-tube be replaced, she is able to eat by mouth.  GI Dr. Tobi Bastos was paged, who stated he does not manage J-tubes.  Patient decided to go home without J-tube, have the protein  by mouth and see her GI doctor this week.  During admission with change dressing.I did review the patient's allergies and medications.The patient is in stable and satisfactory condition for discharge home  Patient will be discharged home without prescriptions.. Patient is to follow up with gastroenterology this week as needed or otherwise directed. Patient is given ED precautions to return to the ED for any worsening or new symptoms. Discussed plan of care with patient, answered all of patient's questions, Patient agreeable to plan of care. Advised patient to take medications according to the instructions on the label. Discussed possible side effects of new medications. Patient verbalized understanding. Clinical Course as of 07/23/23 1932  Sat Jul 23, 2023  1515 Basic metabolic panel(!) BUB, Creatinine elevated, GFR decreased. [AE]  1516 CBC(!) Anemia: Hb 9.8 [AE]  1811 CT ABDOMEN PELVIS WO CONTRAST 1. Jejunostomy tract within the left lateral abdominal wall without drainable fluid collection or extraluminal gas. 2.  Possible constipation. 3. Mild left renal atrophy. 4. Mild bladder wall thickening which could be due to underdistention and/or cystitis.   [AE]    Clinical Course User Index [AE] Gladys Damme, PA-C     FINAL CLINICAL IMPRESSION(S) / ED DIAGNOSES   Final diagnoses:  Encounter for care related to feeding tube  Gastroparesis     Rx / DC Orders   ED Discharge Orders     None        Note:  This document was prepared using Dragon voice recognition software and may include unintentional dictation errors.   Gladys Damme, PA-C 07/23/23 1932    Sharyn Creamer,  MD 07/23/23 1958

## 2023-07-23 NOTE — ED Notes (Signed)
 G tube site was cleaned with HCG soap and water, rinsed with warm water, pat dry, and applied gauze secruing it with tape. Pt tolerated it well.

## 2023-07-23 NOTE — ED Triage Notes (Signed)
 Pt went to see Dr Everlene Farrier on Tuesday, he pushed tube back in. Tuesday night pain started, called aftercare RN and was told to come in to ED. This morning went to change dressing and noticed it looked different, upon checkin in to ED pt sat down and tube came out. Pt having pain on Left side ribs, describes it as sharp when breathing in.

## 2023-07-23 NOTE — Discharge Instructions (Addendum)
 You have been diagnosed with gastroparesis, feeding tube problems.  Please take Glucerna by mouth to prevent dehydration.  Please call and make an appointment with your gastroenterologist this week.  Come back to ED or go to your PCP if you have new symptoms or symptoms worsen.

## 2023-07-23 NOTE — ED Triage Notes (Signed)
 Patient presents with pain at feeding tube insertion site and reports tube fell out after checking in to be seen. Last used feeding tube Tuesday night and reports she hasn't used it since then bc it was painful

## 2023-07-26 ENCOUNTER — Ambulatory Visit: Admitting: Cardiology

## 2023-07-27 ENCOUNTER — Ambulatory Visit: Admitting: Surgery

## 2023-08-01 ENCOUNTER — Encounter: Payer: Self-pay | Admitting: Surgery

## 2023-08-01 ENCOUNTER — Ambulatory Visit (INDEPENDENT_AMBULATORY_CARE_PROVIDER_SITE_OTHER): Admitting: Surgery

## 2023-08-01 VITALS — BP 160/80 | HR 82 | Temp 98.0°F | Ht 61.0 in | Wt 108.8 lb

## 2023-08-01 DIAGNOSIS — K3184 Gastroparesis: Secondary | ICD-10-CM | POA: Diagnosis not present

## 2023-08-01 NOTE — Patient Instructions (Signed)
 High-Protein and High-Calorie Diet Eating high-protein and high-calorie foods can help you to gain weight, heal after an injury, and get better after an illness or surgery. The amount of daily protein and calories you need depends on: Your body weight. The reason you were told to follow this diet. Usually, a high-protein, high-calorie diet means that you should: Eat 250-500 extra calories each day. Make sure that you get enough of your daily calories from protein. Ask your health care provider how many of your calories should come from protein and how many calories total you need each day. Follow the diet as told by your provider. What are tips for following this plan? Reading food labels Check the nutrition facts label for calories, and grams of fat and protein. Items with more than 4 grams of protein are high-protein foods. General information  Ask your provider if you should take a nutritional supplement. Try to eat six small meals each day instead of three large meals. A goal is usually to eat every 2 to 3 hours. Eat a balanced diet. In each meal, include one food that's high in protein and one food with fat in it. Keep nutritious snacks available, such as nuts, trail mixes, dried fruit, and whole-milk yogurt. If you have kidney disease or diabetes, talk with your provider about how much protein is safe for you. Too much protein may put extra stress on your kidneys. Replace zero-calorie drinks with drinks that have calories in them, such as milk and 100% fruit juice. Consider setting a timer to remind you to eat. You'll want to eat even if you do not feel very hungry. Preparing meals Milk and dairy foods. Add whole milk, half-and-half, or heavy cream to cereal, pudding, soup, or hot cocoa. Add whole milk to instant breakfast drinks. Add powdered milk to baked goods, smoothies, or milkshakes. Add powdered milk, cream, or butter to mashed potatoes. Replace water with milk or heavy cream  when making foods such as oatmeal, pudding, or cocoa. Make cream-based pastas and soups. Add cheese to cooked vegetables. Make whole-milk yogurt parfaits. Top them with granola, fruit, or nuts. Add cottage cheese to fruit. Add cream cheese to sandwiches or as a topping on crackers and bread. Eggs. Add hard-boiled eggs to salads. Keep hard-boiled eggs in the fridge to snack on. Add cheese to cooked eggs. Beans, nuts, and seeds. Add peanut butter to oatmeal or smoothies. Use peanut butter as a dip for fruits and vegetables or as a topping for pretzels, celery, or crackers. Add beans to casseroles, dips, and spreads. Add pureed beans to sauces and soups. Salads, soups, and other foods. Add avocado, cheese, or both to sandwiches or salads. Add avocado to smoothies. Add meat, poultry, or seafood to rice, pasta, casseroles, salads, and soups. Use mayonnaise when making egg salad, chicken salad, or tuna salad. Add oil or butter to cooked vegetables and grains. What high-protein foods should I eat?  Vegetables Soybeans. Peas. Grains Quinoa. Bulgur wheat. Buckwheat. Meats and other proteins Beef, pork, and poultry. Fish and seafood. Eggs. Tofu. Textured vegetable protein (TVP). Peanut butter. Nuts and seeds. Dried beans. Protein powders. Hummus. Jerky. Dairy Whole milk. Whole-milk yogurt. Powdered milk. Cheese. Cottage cheese. Eggnog. Beverages High-protein supplement drinks. Soy milk. Other foods Protein bars. The items listed above may not be all the foods and drinks you can have. Talk with an expert in healthy eating called a dietitian to learn more. What high-calorie foods should I eat? Fruits Dried fruit. Fruit leather. Canned  fruit in syrup. Fruit juice. Avocado. Vegetables Vegetables cooked in oil or butter. Fried potatoes. Grains Pasta. Quick breads. Muffins. Pancakes. Granola. Meats and other proteins Peanut butter and other nut butters. Nuts and seeds. Dairy Heavy cream.  Whipped cream. Cream cheese. Sour cream. Ice cream. Custard. Pudding. Whole-milk dairy products. Beverages Meal-replacement beverages. Nutrition shakes. Fruit juice. Seasonings and condiments Salad dressing. Mayonnaise. Alfredo sauce. Fruit preserves or jelly. Honey. Syrup. Sweets and desserts Cake. Cookies. Pie. Pastries. Candy bars. Chocolate. Fats and oils Butter or margarine. Oil. Gravy. Other foods Meal-replacement bars. The items listed above may not be all the foods and drinks you can have. Talk with an expert in healthy eating to learn more. This information is not intended to replace advice given to you by your health care provider. Make sure you discuss any questions you have with your health care provider. Document Revised: 10/04/2022 Document Reviewed: 10/04/2022 Elsevier Patient Education  2024 [employer].

## 2023-08-02 NOTE — Progress Notes (Signed)
  Ashley Camacho is an 33 y.o. female.   Chief Complaint  Patient presents with   Follow-up     HPI: 33 year-old very pleasant female 1 year out from jejunostomy tube.   Came to the ER again recently and  tube got dislodged, it was not replaced.  No fevers no chills.  She is tolerating diet and she is gaining weight ABd pain is better and only occasional, intermittent, mild and dull, no specific alleviating or aggravating factors. She did have CT that I have personally reviewed showing the tract w/o infection or collections  Past Surgical History:  Procedure Laterality Date   BIOPSY  01/14/2021   Procedure: BIOPSY;  Surgeon: Tressia Danas, MD;  Location: [facility] ENDOSCOPY;  Service: Gastroenterology;;   CESAREAN SECTION     x2   CESAREAN SECTION WITH BILATERAL TUBAL LIGATION N/A 02/17/2021   Procedure: CESAREAN SECTION WITH BILATERAL TUBAL LIGATION;  Surgeon: [name], DO;  Location: [facility];  Service: Obstetrics;  Laterality: N/A;   ESOPHAGOGASTRODUODENOSCOPY (EGD) WITH PROPOFOL N/A 01/14/2021   Procedure: ESOPHAGOGASTRODUODENOSCOPY (EGD) WITH PROPOFOL;  Surgeon: Tressia Danas, MD;  Location: [facility] ENDOSCOPY;  Service: Gastroenterology;  Laterality: N/A;   IR BONE MARROW BIOPSY & ASPIRATION  12/16/2022   IR REPLC DUODEN/JEJUNO TUBE PERCUT W/FLUORO  11/22/2022   JEJUNOSTOMY N/A 06/13/2022   Procedure: JEJUNOSTOMY;  Surgeon: Leafy Ro, MD;  Location: [facility] ORS;  Service: General;  Laterality: N/A;   MOUTH SURGERY      Family History  Problem Relation Age of Onset   Breast cancer Mother    Seizures Mother    Diabetes type I Father    CAD Father    Lung cancer Maternal Grandfather    CAD Paternal Grandmother    CAD Paternal Grandfather    Ovarian cancer Neg Hx     Social History:  reports that she has never smoked. She has never been exposed to tobacco smoke. She has never used smokeless tobacco. She reports that she does not drink alcohol and does not use  drugs.  Allergies: No Known Allergies  Medications reviewed.     ROS Full ROS performed and is otherwise negative other than what is stated in the HPI    BP (!) 160/80   Pulse 82   Temp 98 F (36.7 C)   Ht 5\' 1"  (1.549 m)   Wt 108 lb 12.8 oz (49.4 kg)   SpO2 99%   BMI 20.56 kg/m   Physical Exam  Constitutional: Alert non distressed ambulate normally  Eyes: Conjunctivae are normal.  Head: Atraumatic. No masses Nose: No congestion/rhinnorhea. Respiratory: Normal respiratory effort.  No retractions.   Gastrointestinal: Soft and nontender.  Prior feeding tube exit site, no infection, minimal draiange, no masses Musculoskeletal:  no deformity Neurologic:  MAE spontaneously. No gross focal neurologic deficits are appreciated.  Skin:  Skin is warm, dry and intact. No rash noted. Psychiatric: Mood and affect are normal. Speech and behavior are normal.   Assessment/Plan: Doing well w/o tube She wishes to try PO intake  NO evidence of infection or complications RTC prn  Sterling Big, MD [facility] General Surgeon

## 2023-08-03 DIAGNOSIS — Z419 Encounter for procedure for purposes other than remedying health state, unspecified: Secondary | ICD-10-CM | POA: Diagnosis not present

## 2023-08-04 DIAGNOSIS — R569 Unspecified convulsions: Secondary | ICD-10-CM | POA: Diagnosis not present

## 2023-08-04 DIAGNOSIS — G43E09 Chronic migraine with aura, not intractable, without status migrainosus: Secondary | ICD-10-CM | POA: Diagnosis not present

## 2023-08-08 ENCOUNTER — Ambulatory Visit: Payer: Medicaid Other | Admitting: Physician Assistant

## 2023-08-15 DIAGNOSIS — E160 Drug-induced hypoglycemia without coma: Secondary | ICD-10-CM | POA: Diagnosis not present

## 2023-08-15 DIAGNOSIS — T383X5A Adverse effect of insulin and oral hypoglycemic [antidiabetic] drugs, initial encounter: Secondary | ICD-10-CM | POA: Diagnosis not present

## 2023-08-15 DIAGNOSIS — E039 Hypothyroidism, unspecified: Secondary | ICD-10-CM | POA: Diagnosis not present

## 2023-08-15 DIAGNOSIS — E1042 Type 1 diabetes mellitus with diabetic polyneuropathy: Secondary | ICD-10-CM | POA: Diagnosis not present

## 2023-08-15 DIAGNOSIS — K3184 Gastroparesis: Secondary | ICD-10-CM | POA: Diagnosis not present

## 2023-08-15 DIAGNOSIS — E2740 Unspecified adrenocortical insufficiency: Secondary | ICD-10-CM | POA: Diagnosis not present

## 2023-08-17 ENCOUNTER — Other Ambulatory Visit: Payer: Self-pay | Admitting: Neurology

## 2023-08-17 DIAGNOSIS — R569 Unspecified convulsions: Secondary | ICD-10-CM

## 2023-08-25 ENCOUNTER — Ambulatory Visit: Attending: Cardiology | Admitting: Cardiology

## 2023-08-25 ENCOUNTER — Encounter: Payer: Self-pay | Admitting: Cardiology

## 2023-08-25 VITALS — BP 130/72 | HR 89 | Ht 61.5 in | Wt 108.0 lb

## 2023-08-25 DIAGNOSIS — I429 Cardiomyopathy, unspecified: Secondary | ICD-10-CM | POA: Diagnosis not present

## 2023-08-25 DIAGNOSIS — I340 Nonrheumatic mitral (valve) insufficiency: Secondary | ICD-10-CM | POA: Diagnosis not present

## 2023-08-25 DIAGNOSIS — R609 Edema, unspecified: Secondary | ICD-10-CM

## 2023-08-25 NOTE — Patient Instructions (Signed)
 Medication Instructions:   Your Physician recommend you continue on your current medication as directed.     *If you need a refill on your cardiac medications before your next appointment, please call your pharmacy*  Lab Work: None ordered today.  If you have labs (blood work) drawn today and your tests are completely normal, you will receive your results only by: MyChart Message (if you have MyChart) OR A paper copy in the mail If you have any lab test that is abnormal or we need to change your treatment, we will call you to review the results.  Testing/Procedures: Your physician has requested that you have an echocardiogram 1 year. Echocardiography is a painless test that uses sound waves to create images of your heart. It provides your doctor with information about the size and shape of your heart and how well your heart's chambers and valves are working.   You may receive an ultrasound enhancing agent through an IV if needed to better visualize your heart during the echo. This procedure takes approximately one hour.  There are no restrictions for this procedure.  This will take place at [address] [facility] [facility] [address] [facility]) [address], [place] [address]  Please note: We ask at that you not bring children with you during ultrasound (echo/ vascular) testing. Due to room size and safety concerns, children are not allowed in the ultrasound rooms during exams. Our front office staff cannot provide observation of children in our lobby area while testing is being conducted. An adult accompanying a patient to their appointment will only be allowed in the ultrasound room at the discretion of the ultrasound technician under special circumstances. We apologize for any inconvenience.   Follow-Up: At [facility], you and your health needs are our priority.  As part of our continuing mission to provide you with exceptional heart care, our providers are all part of one team.  This team  includes your primary Cardiologist (physician) and Advanced Practice Providers or APPs (Physician Assistants and Nurse Practitioners) who all work together to provide you with the care you need, when you need it.  Your next appointment:   12 month(s) After Echocardiogram  Provider:   You may see Dr. Azucena Cecil or one of the following Advanced Practice Providers on your designated Care Team:   Nicolasa Ducking, NP Ames Dura, PA-C Eula Listen, PA-C Cadence [place], PA-C Charlsie Quest, NP Carlos Levering, NP    We recommend signing up for the patient portal called "MyChart".  Sign up information is provided on this After Visit Summary.  MyChart is used to connect with patients for Virtual Visits (Telemedicine).  Patients are able to view lab/test results, encounter notes, upcoming appointments, etc.  Non-urgent messages can be sent to your provider as well.   To learn more about what you can do with MyChart, go to [email or web address].

## 2023-08-25 NOTE — Progress Notes (Signed)
 Cardiology Office Note:    Date:  08/25/2023   ID:  Ashley Camacho, DOB 1991/03/17, MRN [id number]  PCP:  Debera Lat, PA-C   [facility] Providers Cardiologist:  Debbe Odea, MD     Referring MD: Debera Lat, PA-C   No chief complaint on file.   History of Present Illness:    Ashley Camacho is a 33 y.o. female with a hx of mildly reduced EF (etiology unknown), diabetes type 1, gastroparesis secondary to diabetes, adrenal insufficiency presenting for follow-up.  Being seen due to history of cardiomyopathy.  Echo in 2023 with EF 30 to 35%.  Repeat echocardiogram last month 06/2023 EF 55 to 60%, mild to moderate MR.  Has a history of adrenal insufficiency, was not taking hydrocortisone as prescribed leading to low BP.  Midodrine was given to help with blood pressures but patient has not needed to take midodrine.  She now takes hydrocortisone as prescribed thing in normal blood pressures.  Diabetes being managed by endocrinology.  Denies chest pain or shortness of breath.  Has occasional dependent edema after being on her feet for too long.  Otherwise has no cardiac concerns at this time   Prior notes/testing Echo 06/2023 EF 55 to 60% Outside echo at [facility] 02/2022 EF 30 to 35% Echo 02/2021 EF 50 to 55% Echo 11/2020 EF 40 to 45%  Past Medical History:  Diagnosis Date   Acute metabolic encephalopathy 06/10/2022   Adrenal insufficiency (HCC)    Anemia    CHF (congestive heart failure) (HCC)    Gastroparesis    Hypertension    Hypotension    Intractable nausea and vomiting 11/13/2022   Migraine    Thyroid disease    Type 1 diabetes (HCC)     Past Surgical History:  Procedure Laterality Date   BIOPSY  01/14/2021   Procedure: BIOPSY;  Surgeon: Tressia Danas, MD;  Location: [facility] ENDOSCOPY;  Service: Gastroenterology;;   CESAREAN SECTION     x2   CESAREAN SECTION WITH BILATERAL TUBAL LIGATION N/A 02/17/2021   Procedure: CESAREAN SECTION WITH BILATERAL  TUBAL LIGATION;  Surgeon: [name], DO;  Location: [facility] ORS;  Service: Obstetrics;  Laterality: N/A;   ESOPHAGOGASTRODUODENOSCOPY (EGD) WITH PROPOFOL N/A 01/14/2021   Procedure: ESOPHAGOGASTRODUODENOSCOPY (EGD) WITH PROPOFOL;  Surgeon: Tressia Danas, MD;  Location: [facility] ENDOSCOPY;  Service: Gastroenterology;  Laterality: N/A;   IR BONE MARROW BIOPSY & ASPIRATION  12/16/2022   IR REPLC DUODEN/JEJUNO TUBE PERCUT W/FLUORO  11/22/2022   JEJUNOSTOMY N/A 06/13/2022   Procedure: JEJUNOSTOMY;  Surgeon: Leafy Ro, MD;  Location: [facility] ORS;  Service: General;  Laterality: N/A;   MOUTH SURGERY      Current Medications: Current Meds  Medication Sig   acetaminophen (TYLENOL) 500 MG tablet Take 2 tablets (1,000 mg total) by mouth every 6 (six) hours as needed for headache, fever or moderate pain.   Blood Pressure Monitoring (BLOOD PRESSURE CUFF) MISC Use as directed   hydrocortisone (CORTEF) 10 MG tablet Take 2 tablets (20 mg total) by mouth daily with breakfast AND 1 tablet (10 mg total) daily with supper.   insulin aspart (NOVOLOG FLEXPEN) 100 UNIT/ML FlexPen Inject 2 units into the skin 3 (three) times daily with meals. In addition, inject 0-9 Units into the skin 3 (three) times daily with meals based on your blood sugar. Also inject 0-5 units into the skin every night at bedtime based on your blood sugar. See att (Patient taking differently: Inject into the skin  3-4 times daily with meals and as needed. In addition, inject 0-9 Units into the skin 3 (three) times daily with meals based on your blood sugar. Also inject 0-5 units into the skin every night at bedtime based on your blood sugar. See att)   insulin glargine (LANTUS) 100 UNIT/ML Solostar Pen Inject 6-7 Units into the skin daily. (Patient taking differently: Inject 10 Units into the skin daily.)   levothyroxine (SYNTHROID) 75 MCG tablet TAKE 1 TABLET (75 MCG TOTAL) BY MOUTH DAILY AT 6 (SIX) AM.   loperamide (IMODIUM) 2 MG capsule Take  2 capsules (4 mg total) by mouth as needed for diarrhea or loose stools.   midodrine (PROAMATINE) 2.5 MG tablet Take 1 tablet (2.5 mg total) by mouth 3 (three) times daily with meals as needed (systolic press less than 100).   promethazine (PHENERGAN) 25 MG tablet Take 0.5 tablets (12.5 mg total) by mouth every 8 (eight) hours as needed for nausea or vomiting.     Allergies:   Patient has no known allergies.   Social History   Socioeconomic History   Marital status: Single    Spouse name: Not on file   Number of children: Not on file   Years of education: Not on file   Highest education level: 9th grade  Occupational History   Occupation: home maker  Tobacco Use   Smoking status: Never    Passive exposure: Never   Smokeless tobacco: Never  Vaping Use   Vaping status: Never Used  Substance and Sexual Activity   Alcohol use: No   Drug use: No   Sexual activity: Not Currently    Birth control/protection: None  Other Topics Concern   Not on file  Social History Narrative   Not on file   Social Drivers of Health   Financial Resource Strain: Low Risk  (08/04/2023)   Received from [facility]   Overall Financial Resource Strain (CARDIA)    Difficulty of Paying Living Expenses: Not very hard  Recent Concern: Financial Resource Strain - High Risk (05/09/2023)   Overall Financial Resource Strain (CARDIA)    Difficulty of Paying Living Expenses: Very hard  Food Insecurity: No Food Insecurity (08/04/2023)   Received from [facility]   Hunger Vital Sign    Worried About Running Out of Food in the Last Year: Never true    Ran Out of Food in the Last Year: Never true  Transportation Needs: Unmet Transportation Needs (08/04/2023)   Received from [facility] - Transportation    In the past 12 months, has lack of transportation kept you from medical appointments or from getting medications?: Yes    Lack of Transportation  (Non-Medical): No  Physical Activity: Unknown (05/09/2023)   Exercise Vital Sign    Days of Exercise per Week: 0 days    Minutes of Exercise per Session: Not on file  Stress: Stress Concern Present (05/09/2023)   [employer] of Occupational Health - Occupational Stress Questionnaire    Feeling of Stress : Very much  Social Connections: Socially Isolated (05/09/2023)   Social Connection and Isolation Panel [NHANES]    Frequency of Communication with Friends and Family: Once a week    Frequency of Social Gatherings with Friends and Family: More than three times a week    Attends Religious Services: Never    Database administrator or Organizations: No    Attends Banker Meetings: Not on file  Marital Status: Never married     Family History: The patient's family history includes Breast cancer in her mother; CAD in her father, paternal grandfather, and paternal grandmother; Diabetes type I in her father; Lung cancer in her maternal grandfather; Seizures in her mother. There is no history of Ovarian cancer.  ROS:   Please see the history of present illness.     All other systems reviewed and are negative.  EKGs/Labs/Other Studies Reviewed:    The following studies were reviewed today:       Recent Labs: 05/26/2023: ALT 22; B Natriuretic Peptide 82.6; Magnesium 1.6; TSH 3.022 07/23/2023: BUN 49; Creatinine, Ser 1.98; Hemoglobin 9.8; Platelets 189; Potassium 4.8; Sodium 142  Recent Lipid Panel    Component Value Date/Time   CHOL 133 05/10/2023 1405   TRIG 249 (H) 05/10/2023 1405   HDL 45 05/10/2023 1405   CHOLHDL 3.0 05/10/2023 1405   CHOLHDL 4.6 12/21/2020 0550   VLDL 21 12/21/2020 0550   LDLCALC 49 05/10/2023 1405     Risk Assessment/Calculations:             Physical Exam:    VS:  BP 130/72 (BP Location: Right Arm, Patient Position: Sitting, Cuff Size: Normal)   Pulse 89   Ht 5' 1.5" (1.562 m)   Wt 108 lb (49 kg)   SpO2 99%   BMI 20.08 kg/m      Wt Readings from Last 3 Encounters:  08/25/23 108 lb (49 kg)  08/01/23 108 lb 12.8 oz (49.4 kg)  07/23/23 106 lb (48.1 kg)     GEN:  Well nourished, well developed in no acute distress HEENT: Normal NECK: No JVD; No carotid bruits CARDIAC: RRR, no murmurs, rubs, gallops RESPIRATORY:  Clear to auscultation without rales, wheezing or rhonchi  ABDOMEN: Soft, non-tender, non-distended MUSCULOSKELETAL:  No edema; No deformity  SKIN: Warm and dry NEUROLOGIC:  Alert and oriented x 3 PSYCHIATRIC:  Normal affect   ASSESSMENT:    1. Cardiomyopathy, unspecified type (HCC)   2. Mitral valve insufficiency, unspecified etiology   3. Dependent edema    PLAN:    In order of problems listed above:  Cardiomyopathy, EF now normalized 55 to 60%.  Etiology unknown. Mild to moderate MR.  Continue serial monitoring.  Repeat echo in 1 year after repeat echo. Dependent edema, keeping legs elevated, compression stockings advised  Follow-up in 1 year.     Medication Adjustments/Labs and Tests Ordered: Current medicines are reviewed at length with the patient today.  Concerns regarding medicines are outlined above.  Orders Placed This Encounter  Procedures   ECHOCARDIOGRAM COMPLETE   No orders of the defined types were placed in this encounter.   Patient Instructions  Medication Instructions:   Your Physician recommend you continue on your current medication as directed.     *If you need a refill on your cardiac medications before your next appointment, please call your pharmacy*  Lab Work: None ordered today.  If you have labs (blood work) drawn today and your tests are completely normal, you will receive your results only by: MyChart Message (if you have MyChart) OR A paper copy in the mail If you have any lab test that is abnormal or we need to change your treatment, we will call you to review the results.  Testing/Procedures: Your physician has requested that you have an  echocardiogram 1 year. Echocardiography is a painless test that uses sound waves to create images of your heart. It provides your doctor  with information about the size and shape of your heart and how well your heart's chambers and valves are working.   You may receive an ultrasound enhancing agent through an IV if needed to better visualize your heart during the echo. This procedure takes approximately one hour.  There are no restrictions for this procedure.  This will take place at [address] [facility] [address] [facility]) [address], [place] [address]  Please note: We ask at that you not bring children with you during ultrasound (echo/ vascular) testing. Due to room size and safety concerns, children are not allowed in the ultrasound rooms during exams. Our front office staff cannot provide observation of children in our lobby area while testing is being conducted. An adult accompanying a patient to their appointment will only be allowed in the ultrasound room at the discretion of the ultrasound technician under special circumstances. We apologize for any inconvenience.   Follow-Up: At [facility], you and your health needs are our priority.  As part of our continuing mission to provide you with exceptional heart care, our providers are all part of one team.  This team includes your primary Cardiologist (physician) and Advanced Practice Providers or APPs (Physician Assistants and Nurse Practitioners) who all work together to provide you with the care you need, when you need it.  Your next appointment:   12 month(s) After Echocardiogram  Provider:   You may see Dr. Azucena Cecil or one of the following Advanced Practice Providers on your designated Care Team:   Nicolasa Ducking, NP Ames Dura, PA-C Eula Listen, PA-C Cadence Peterson, PA-C Charlsie Quest, NP Carlos Levering, NP    We recommend signing up for the patient portal called "MyChart".  Sign up information is provided on this  After Visit Summary.  MyChart is used to connect with patients for Virtual Visits (Telemedicine).  Patients are able to view lab/test results, encounter notes, upcoming appointments, etc.  Non-urgent messages can be sent to your provider as well.   To learn more about what you can do with MyChart, go to [email or web address].           Signed, Debbe Odea, MD  08/25/2023 10:57 AM    [facility]

## 2023-08-27 ENCOUNTER — Ambulatory Visit
Admission: RE | Admit: 2023-08-27 | Discharge: 2023-08-27 | Disposition: A | Discharge status: Home / Self Care | Source: Ambulatory Visit | Attending: Neurology | Admitting: Neurology

## 2023-08-27 DIAGNOSIS — R569 Unspecified convulsions: Secondary | ICD-10-CM | POA: Insufficient documentation

## 2023-08-27 DIAGNOSIS — R9082 White matter disease, unspecified: Secondary | ICD-10-CM | POA: Diagnosis not present

## 2023-08-29 ENCOUNTER — Ambulatory Visit: Admitting: Student

## 2023-09-03 DIAGNOSIS — Z419 Encounter for procedure for purposes other than remedying health state, unspecified: Secondary | ICD-10-CM | POA: Diagnosis not present

## 2023-09-29 ENCOUNTER — Ambulatory Visit
Admission: EM | Admit: 2023-09-29 | Discharge: 2023-09-29 | Disposition: A | Discharge status: Home / Self Care | Attending: Emergency Medicine | Admitting: Emergency Medicine

## 2023-09-29 ENCOUNTER — Ambulatory Visit: Payer: Self-pay | Admitting: *Deleted

## 2023-09-29 ENCOUNTER — Ambulatory Visit (INDEPENDENT_AMBULATORY_CARE_PROVIDER_SITE_OTHER)

## 2023-09-29 DIAGNOSIS — H66001 Acute suppurative otitis media without spontaneous rupture of ear drum, right ear: Secondary | ICD-10-CM

## 2023-09-29 DIAGNOSIS — Y9344 Activity, trampolining: Secondary | ICD-10-CM | POA: Diagnosis not present

## 2023-09-29 DIAGNOSIS — E1022 Type 1 diabetes mellitus with diabetic chronic kidney disease: Secondary | ICD-10-CM | POA: Insufficient documentation

## 2023-09-29 DIAGNOSIS — R600 Localized edema: Secondary | ICD-10-CM | POA: Diagnosis not present

## 2023-09-29 DIAGNOSIS — M79675 Pain in left toe(s): Secondary | ICD-10-CM

## 2023-09-29 DIAGNOSIS — X58XXXA Exposure to other specified factors, initial encounter: Secondary | ICD-10-CM | POA: Diagnosis not present

## 2023-09-29 DIAGNOSIS — Z86718 Personal history of other venous thrombosis and embolism: Secondary | ICD-10-CM | POA: Diagnosis not present

## 2023-09-29 DIAGNOSIS — R112 Nausea with vomiting, unspecified: Secondary | ICD-10-CM

## 2023-09-29 DIAGNOSIS — N183 Chronic kidney disease, stage 3 unspecified: Secondary | ICD-10-CM | POA: Insufficient documentation

## 2023-09-29 DIAGNOSIS — J0100 Acute maxillary sinusitis, unspecified: Secondary | ICD-10-CM

## 2023-09-29 DIAGNOSIS — S90122A Contusion of left lesser toe(s) without damage to nail, initial encounter: Secondary | ICD-10-CM

## 2023-09-29 DIAGNOSIS — K3184 Gastroparesis: Secondary | ICD-10-CM | POA: Insufficient documentation

## 2023-09-29 DIAGNOSIS — D696 Thrombocytopenia, unspecified: Secondary | ICD-10-CM | POA: Diagnosis not present

## 2023-09-29 DIAGNOSIS — L03032 Cellulitis of left toe: Secondary | ICD-10-CM | POA: Diagnosis not present

## 2023-09-29 DIAGNOSIS — E2740 Unspecified adrenocortical insufficiency: Secondary | ICD-10-CM | POA: Insufficient documentation

## 2023-09-29 DIAGNOSIS — K219 Gastro-esophageal reflux disease without esophagitis: Secondary | ICD-10-CM | POA: Insufficient documentation

## 2023-09-29 DIAGNOSIS — E1043 Type 1 diabetes mellitus with diabetic autonomic (poly)neuropathy: Secondary | ICD-10-CM | POA: Insufficient documentation

## 2023-09-29 DIAGNOSIS — I5022 Chronic systolic (congestive) heart failure: Secondary | ICD-10-CM | POA: Insufficient documentation

## 2023-09-29 DIAGNOSIS — I130 Hypertensive heart and chronic kidney disease with heart failure and stage 1 through stage 4 chronic kidney disease, or unspecified chronic kidney disease: Secondary | ICD-10-CM | POA: Diagnosis not present

## 2023-09-29 DIAGNOSIS — F32A Depression, unspecified: Secondary | ICD-10-CM | POA: Diagnosis not present

## 2023-09-29 DIAGNOSIS — R1110 Vomiting, unspecified: Secondary | ICD-10-CM | POA: Diagnosis present

## 2023-09-29 DIAGNOSIS — F419 Anxiety disorder, unspecified: Secondary | ICD-10-CM | POA: Insufficient documentation

## 2023-09-29 DIAGNOSIS — R197 Diarrhea, unspecified: Secondary | ICD-10-CM | POA: Diagnosis present

## 2023-09-29 LAB — COMPREHENSIVE METABOLIC PANEL WITH GFR
ALT: 22 U/L (ref 0–44)
AST: 30 U/L (ref 15–41)
Albumin: 3.3 g/dL — ABNORMAL LOW (ref 3.5–5.0)
Alkaline Phosphatase: 73 U/L (ref 38–126)
Anion gap: 9 (ref 5–15)
BUN: 35 mg/dL — ABNORMAL HIGH (ref 6–20)
CO2: 25 mmol/L (ref 22–32)
Calcium: 8.7 mg/dL — ABNORMAL LOW (ref 8.9–10.3)
Chloride: 103 mmol/L (ref 98–111)
Creatinine, Ser: 2.11 mg/dL — ABNORMAL HIGH (ref 0.44–1.00)
GFR, Estimated: 31 mL/min — ABNORMAL LOW (ref >60)
Glucose, Bld: 95 mg/dL (ref 70–99)
Potassium: 4.7 mmol/L (ref 3.5–5.1)
Sodium: 137 mmol/L (ref 135–145)
Total Bilirubin: <0.2 mg/dL (ref 0.0–1.2)
Total Protein: 7.3 g/dL (ref 6.5–8.1)

## 2023-09-29 LAB — CBC WITH DIFFERENTIAL/PLATELET
Abs Immature Granulocytes: 0.04 10*3/uL (ref 0.00–0.07)
Basophils Absolute: 0.1 10*3/uL (ref 0.0–0.1)
Basophils Relative: 1 %
Eosinophils Absolute: 0.2 10*3/uL (ref 0.0–0.5)
Eosinophils Relative: 2 %
HCT: 27.2 % — ABNORMAL LOW (ref 36.0–46.0)
Hemoglobin: 8.7 g/dL — ABNORMAL LOW (ref 12.0–15.0)
Immature Granulocytes: 0 %
Lymphocytes Relative: 15 %
Lymphs Abs: 2 10*3/uL (ref 0.7–4.0)
MCH: 29.8 pg (ref 26.0–34.0)
MCHC: 32 g/dL (ref 30.0–36.0)
MCV: 93.2 fL (ref 80.0–100.0)
Monocytes Absolute: 0.8 10*3/uL (ref 0.1–1.0)
Monocytes Relative: 6 %
Neutro Abs: 9.7 10*3/uL — ABNORMAL HIGH (ref 1.7–7.7)
Neutrophils Relative %: 76 %
Platelets: 233 10*3/uL (ref 150–400)
RBC: 2.92 MIL/uL — ABNORMAL LOW (ref 3.87–5.11)
RDW: 12.1 % (ref 11.5–15.5)
WBC: 12.8 10*3/uL — ABNORMAL HIGH (ref 4.0–10.5)
nRBC: 0 % (ref 0.0–0.2)

## 2023-09-29 MED ORDER — CEFDINIR 300 MG PO CAPS: 300.0000 mg | ORAL_CAPSULE | Freq: Two times a day (BID) | ORAL | 0 refills | Status: DC

## 2023-09-29 MED ORDER — ONDANSETRON 4 MG PO TBDP: 4.0000 mg | ORAL_TABLET | Freq: Three times a day (TID) | ORAL | 0 refills | Status: DC | PRN

## 2023-09-29 NOTE — ED Triage Notes (Signed)
 Sinus pain and pressure, cough, congestion, vomiting and diarrhea that comes and goes, right ear pain that goes to her jaw and right side of face x 1 week, Tried OTC ear drops, warm compress with no relief of symptoms.   Pt states she also has possible infected left second toe x 1 week.

## 2023-09-29 NOTE — Telephone Encounter (Signed)
 Copied from CRM [id number]. Topic: Clinical - Red Word Triage >> Sep 29, 2023  8:42 AM Everlene Hobby D wrote: Patient has a bad ear ache says she's been sick the last week. She says the right side of her face hurts. Says her face feels swollen around the ear area. Reason for Disposition  Earache  (Exceptions: brief ear pain of < 60 minutes duration, earache occurring during air travel  Answer Assessment - Initial Assessment Questions 1. LOCATION: "Which ear is involved?"     My right ear is hurting. 2. ONSET: "When did the ear start hurting"      I've had a cold all week.  Earache started 4-5 days ago. It started like normal earache but the pain is spreading into my jaw and temporal area and back of my ear.   Having sharp pains.   I can hear swishing sounds    I hear a pop every so often and then a sharp pain occurs. 3. SEVERITY: "How bad is the pain?"  (Scale 1-10; mild, moderate or severe)   - MILD (1-3): doesn't interfere with normal activities    - MODERATE (4-7): interferes with normal activities or awakens from sleep    - SEVERE (8-10): excruciating pain, unable to do any normal activities      Moderate 4. URI SYMPTOMS: "Do you have a runny nose or cough?"     Coughing up mucus, runny nose,  No sore throat now but did in the beginning. About the same time.  I have a wound on my toe that is getting infected I want to have checked. 5. FEVER: "Do you have a fever?" If Yes, ask: "What is your temperature, how was it measured, and when did it start?"     No fever I have a lot of health problems.   I have adrenal problems.   I was shaking and all the other night.   I'm weak and lightheaded. 6. CAUSE: "Have you been swimming recently?", "How often do you use Q-TIPS?", "Have you had any recent air travel or scuba diving?"     Not asked 7. OTHER SYMPTOMS: "Do you have any other symptoms?" (e.g., headache, stiff neck, dizziness, vomiting, runny nose, decreased hearing)     See above 8. PREGNANCY: "Is  there any chance you are pregnant?" "When was your last menstrual period?"     No  Protocols used: Earache-A-AH  Chief Complaint: Right earache and infected wound on her toe. Symptoms: runny nose, coughing for last 4-5 days.  I've had a cold all week.   Also has a wound on one of her toes that is looking infected she wants to have evaluated. Frequency: Last 4-5 days the earache is getting worse so is infected wound on toe Pertinent Negatives: Patient denies OTC medications helping with earache Disposition: [] ED /[] Urgent Care (no appt availability in office) / [x] Appointment(In office/virtual)/ []  [facility]/ [] Home Care/ [] Refused Recommended Disposition /[] [facility]/ []  Follow-up with PCP Additional Notes: No appts available with any of the providers until Monday May 12th.   She wants to have her ear examined so is going to the urgent care this morning.

## 2023-09-29 NOTE — ED Provider Notes (Signed)
 [facility]    CSN: [id number] Arrival date & time: 09/29/23  0948      History   Chief Complaint Chief Complaint  Patient presents with   Otalgia   Cough    HPI Ashley Camacho is a 33 y.o. female.   HPI  33 year old female with past medical history significant for adrenal insufficiency, acute metabolic encephalopathy, CHF, gastroparesis, hypertension, CKD stage III, type 1 diabetes, anxiety, DVT, MDD, thrombocytopenia, and GERD presents for evaluation of multiple complaints that have been going on over the last week.  Of note includes nasal congestion with sinus pressure and yellow nasal discharge that is occasionally bloody, muffled hearing in her right ear with right ear pain that radiates to her right jaw, productive cough for yellow sputum, 1 episode of vomiting, nausea, and 2 episodes of diarrhea yesterday.  She denies any blood in her stool.  She also denies fever, shortness with, or wheezing.  Additionally, she is complaining of pain in her left second toe that resulted from a trampoline injury 1 week ago.  She reports that her toe is ecchymotic with a wound on the dorsal aspect that is draining a yellow pus.  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   Malnutrition of moderate degree 03/24/2023   Type 1 diabetes mellitus with hyperosmolar hyperglycemic state (HHS) (HCC) 03/23/2023   Diabetes mellitus type 1 with hyperosmolarity (HCC) 03/23/2023   Anemia    Anemia in chronic kidney disease  (CKD) 12/30/2022   AKI (acute kidney injury) (HCC) 11/22/2022   Dislodged jejunostomy tube 11/22/2022   Type 1 diabetes mellitus without complications (HCC) 11/22/2022   DVT (deep venous thrombosis) (HCC) 11/20/2022   Failure to thrive in adult 11/13/2022   Hyperglycemia 11/13/2022   Intractable nausea and vomiting 11/13/2022   Normocytic anemia 11/12/2022   Severe recurrent major depression (HCC) 11/03/2022   CKD stage 3a, GFR 45-59 ml/min (HCC) 11/01/2022   Elevated serum creatinine 11/01/2022   Abdominal pain 11/01/2022   Cystitis 11/01/2022   Acute exacerbation of congestive heart failure (HCC) 08/23/2022   Diabetic ketoacidosis (HCC) 08/23/2022   Hypothermia 07/27/2022   Stage 3b chronic kidney disease (HCC) 06/29/2022   Hypovolemic shock (HCC) 06/28/2022   Jejunostomy tube present (HCC) 06/28/2022   Insomnia 06/20/2022   Hypophosphatemia 06/13/2022   Severe malnutrition (HCC) 06/13/2022   Hypotension 06/10/2022   Pseudohyponatremia 06/10/2022   Hypomagnesemia 06/10/2022   Adrenal insufficiency (HCC) 06/09/2022   Acute gastroenteritis 02/28/2022   Hypokalemia 02/28/2022   Hypocalcemia 02/28/2022   Metabolic acidosis 02/28/2022   Essential hypertension 02/28/2022   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   Nausea & vomiting 02/28/2022   High anion gap metabolic acidosis 01/02/2022   UTI (urinary tract infection) 11/03/2021   Hypoglycemia due to type 1 diabetes mellitus (HCC) 11/02/2021   Thrombocytopenia (HCC) 11/02/2021   Problem related to social environment 09/24/2021   Rhinovirus 09/22/2021   Acute on chronic HFrEF (heart failure with reduced  ejection fraction) (HCC) 09/07/2021   Pneumonia due to human metapneumovirus 08/31/2021   Gastro-esophageal reflux disease with esophagitis 08/31/2021   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   Congestive heart failure (HCC) 08/31/2021   Hypoglycemia due to insulin  06/14/2021   DKA (diabetic ketoacidosis) (HCC)  06/14/2021   CKD stage 3 due to type 1 diabetes mellitus (HCC) 06/14/2021   Transaminitis 06/14/2021   Hypoglycemia associated with diabetes (HCC) 06/03/2021   DKA, type 1 (HCC) 05/20/2021   Major depressive disorder, recurrent episode, moderate (HCC) 05/08/2021   Right arm pain 05/07/2021   Rash of hand 05/07/2021   Hyperkalemia 05/06/2021   History of adrenal insufficiency 05/06/2021   CAP (community acquired pneumonia) 05/05/2021   Hypoglycemia 04/27/2021   Combined systolic and diastolic heart failure (HCC) 03/11/2021   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) 03/11/2021   Protein-calorie malnutrition, severe (HCC) 03/11/2021   Adrenal insufficiency (Addison's disease) (HCC) 03/10/2021   Postoperative hematoma involving genitourinary system following genitourinary procedure    S/P cesarean section 02/17/2021   Acute esophagitis    Leg edema    History of preterm delivery, currently pregnant    Pre-existing type 1 diabetes mellitus during pregnancy in third trimester    Anemia of chronic disease    Type 1 diabetes mellitus affecting pregnancy in second trimester, antepartum    HFrEF (heart failure with reduced ejection fraction) (HCC)    Acute deep vein thrombosis (DVT) of brachial vein of right upper extremity (HCC) 12/17/2020   Anxiety 12/17/2020   Diabetic retinopathy (HCC) 12/17/2020   Chronic combined systolic and diastolic CHF (congestive heart failure) (HCC)    Hyperemesis 12/13/2020   Type 1 diabetes mellitus with hypoglycemia without coma (HCC) 12/13/2020   Sunburn 12/13/2020   Hypothyroidism 12/10/2020   Malnutrition (HCC) 12/10/2020   Vitreous hemorrhage of right eye (HCC) 12/10/2020   Pleural effusion associated with pulmonary infection 11/25/2020   Pneumonia affecting pregnancy 11/24/2020   Diabetes mellitus affecting pregnancy, second trimester 11/24/2020   Edema 11/24/2020   Decreased urine output 11/24/2020   Shortness of  breath    Zinc  deficiency    SOB (shortness of breath)    Social problem 11/21/2020   Nausea and vomiting during pregnancy prior to [redacted] weeks gestation 11/21/2020   Low serum vitamin B12    Delivery with history of C-section 11/20/2020   Absolute anemia    Acute kidney injury superimposed on chronic kidney disease (HCC)    Nausea and vomiting during pregnancy 10/23/2020   History of premature delivery, currently pregnant, second trimester 10/13/2020   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   Peripheral neuropathy 09/28/2018   Gastroparesis 04/22/2016   Diabetic sensorimotor neuropathy (HCC) 04/19/2016   DKA (diabetic ketoacidosis) (HCC) 04/03/2016   Acute bronchitis 06/01/2010   Microalbuminuria 09/26/2005    Past Surgical History:  Procedure Laterality Date   BIOPSY  01/14/2021   Procedure: BIOPSY;  Surgeon: Lindle Rhea, MD;  Location[facility] ENDOSCOPY;  Service: Gastroenterology;;   CESAREAN SECTION     x2   CESAREAN SECTION WITH BILATERAL TUBAL LIGATION N/A 02/17/2021   Procedure: CESAREAN SECTION WITH BILATERAL TUBAL LIGATION;  Surgeon: Malka Sea, DO;  Location: [facility] LD ORS;  Service: Obstetrics;  Laterality: N/A;   ESOPHAGOGASTRODUODENOSCOPY (EGD) WITH PROPOFOL  N/A 01/14/2021   Procedure: ESOPHAGOGASTRODUODENOSCOPY (EGD) WITH PROPOFOL ;  Surgeon: Lindle Rhea, MD;  Location: [facility] ENDOSCOPY;  Service: Gastroenterology;  Laterality: N/A;   IR BONE MARROW BIOPSY & ASPIRATION  12/16/2022   IR REPLC DUODEN/JEJUNO  TUBE PERCUT W/FLUORO  11/22/2022   JEJUNOSTOMY N/A 06/13/2022   Procedure: Tonette Franco;  Surgeon: Alben Alma, MD;  Location: [facility] ORS;  Service: General;  Laterality: N/A;   MOUTH SURGERY      OB History     Gravida  2   Para  2   Term      Preterm  2   AB      Living  2      SAB      IAB      Ectopic      Multiple  0   Live Births  2            Home Medications    Prior to Admission medications   Medication Sig Start Date End  Date Taking? Authorizing Provider  cefdinir (OMNICEF) 300 MG capsule Take 1 capsule (300 mg total) by mouth 2 (two) times daily for 10 days. 09/29/23 10/09/23 Yes Kent Pear, NP  Continuous Glucose Sensor (DEXCOM G7 SENSOR) MISC SMARTSIG:1 Each Every 10 Days   Yes [provider]  DEPAKOTE  ER 250 MG 24 hr tablet    Yes [provider]  hydrocortisone  (CORTEF ) 10 MG tablet Take 2 tablets (20 mg total) by mouth daily with breakfast AND 1 tablet (10 mg total) daily with supper. 05/27/23  Yes Sreenath, Sudheer B, MD  insulin  aspart (NOVOLOG  FLEXPEN) 100 UNIT/ML FlexPen Inject 2 units into the skin 3 (three) times daily with meals. In addition, inject 0-9 Units into the skin 3 (three) times daily with meals based on your blood sugar. Also inject 0-5 units into the skin every night at bedtime based on your blood sugar. See att Patient taking differently: Inject into the skin 3-4 times daily with meals and as needed. In addition, inject 0-9 Units into the skin 3 (three) times daily with meals based on your blood sugar. Also inject 0-5 units into the skin every night at bedtime based on your blood sugar. See att 06/01/23  Yes Ostwalt, [name], PA-C  insulin  glargine (LANTUS ) 100 UNIT/ML Solostar Pen Inject 6-7 Units into the skin daily. Patient taking differently: Inject 10 Units into the skin daily. 05/10/23  Yes Ostwalt, [name], PA-C  levothyroxine  (SYNTHROID ) 75 MCG tablet TAKE 1 TABLET (75 MCG TOTAL) BY MOUTH DAILY AT 6 (SIX) AM. 06/20/23  Yes Ostwalt, [name], PA-C  loperamide  (IMODIUM ) 2 MG capsule Take 2 capsules (4 mg total) by mouth as needed for diarrhea or loose stools. 11/15/22  Yes Ree Candy, MD  ondansetron  (ZOFRAN -ODT) 4 MG disintegrating tablet Take 1 tablet (4 mg total) by mouth every 8 (eight) hours as needed for nausea or vomiting. 09/29/23  Yes Kent Pear, NP  acetaminophen  (TYLENOL ) 500 MG tablet Take 2 tablets (1,000 mg total) by mouth every 6 (six) hours as needed for  headache, fever or moderate pain. 08/05/22   Ezzard Holms, MD  Blood Pressure Monitoring (BLOOD PRESSURE CUFF) MISC Use as directed 05/26/23   Morey Ar, NP  midodrine  (PROAMATINE ) 2.5 MG tablet Take 1 tablet (2.5 mg total) by mouth 3 (three) times daily with meals as needed (systolic press less than 100). 05/26/23   Morey Ar, NP  Nutritional Supplements (FEEDING SUPPLEMENT, GLUCERNA 1.5 CAL,) LIQD Place 640 mLs into feeding tube at bedtime. Patient not taking: Reported on 08/25/2023 11/15/22   Ree Candy, MD  promethazine  (PHENERGAN ) 25 MG tablet Take 0.5 tablets (12.5 mg total) by mouth every 8 (eight) hours as needed for nausea or  vomiting. 04/10/23   Aisha Hove, MD    Family History Family History  Problem Relation Age of Onset   Breast cancer Mother    Seizures Mother    Diabetes type I Father    CAD Father    Lung cancer Maternal Grandfather    CAD Paternal Grandmother    CAD Paternal Grandfather    Ovarian cancer Neg Hx     Social History Social History   Tobacco Use   Smoking status: Never    Passive exposure: Never   Smokeless tobacco: Never  Vaping Use   Vaping status: Never Used  Substance Use Topics   Alcohol use: No   Drug use: No     Allergies   Patient has no known allergies.   Review of Systems Review of Systems  Constitutional:  Negative for fever.  HENT:  Positive for congestion, ear pain, rhinorrhea, sinus pressure and sinus pain. Negative for ear discharge.   Respiratory:  Positive for cough. Negative for shortness of breath and wheezing.   Gastrointestinal:  Positive for diarrhea, nausea and vomiting. Negative for blood in stool.  Skin:  Positive for color change and wound.     Physical Exam Triage Vital Signs ED Triage Vitals  Encounter Vitals Group     BP      Systolic BP Percentile      Diastolic BP Percentile      Pulse      Resp      Temp      Temp src      SpO2      Weight      Height       Head Circumference      Peak Flow      Pain Score      Pain Loc      Pain Education      Exclude from Growth Chart    Orthostatic VS for the past 24 hrs:  BP- Lying Pulse- Lying BP- Sitting Pulse- Sitting BP- Standing at 0 minutes Pulse- Standing at 0 minutes  09/29/23 1125 91/62 93 103/69 87 93/65 90    Updated Vital Signs BP 102/70 (BP Location: Left Arm)   Pulse 97   Temp 99.2 F (37.3 C) (Oral)   Resp 16   LMP 09/26/2023 (Approximate)   SpO2 98%   Visual Acuity Right Eye Distance:   Left Eye Distance:   Bilateral Distance:    Right Eye Near:   Left Eye Near:    Bilateral Near:     Physical Exam Vitals and nursing note reviewed.  Constitutional:      Appearance: Normal appearance. She is ill-appearing.  HENT:     Head: Normocephalic and atraumatic.     Right Ear: Ear canal and external ear normal. There is no impacted cerumen.     Left Ear: Tympanic membrane, ear canal and external ear normal. There is no impacted cerumen.     Ears:     Comments: Right tympanic membrane is erythematous.  No injection or or fluid accumulation.  External auditory canal is clear.    Nose: Congestion and rhinorrhea present.     Comments: Nasal mucosa is edematous and erythematous with yellow discharge in both nares.  Patient has marked tenderness to compression of bilateral maxillary sinuses.    Mouth/Throat:     Mouth: Mucous membranes are moist.     Pharynx: Oropharynx is clear. No oropharyngeal exudate or posterior oropharyngeal erythema.  Neck:  Comments: Tender to palpation of the anterior cervical region bilaterally without appreciable lymphadenopathy. Cardiovascular:     Rate and Rhythm: Normal rate and regular rhythm.     Pulses: Normal pulses.     Heart sounds: Normal heart sounds. No murmur heard.    No friction rub. No gallop.  Pulmonary:     Effort: Pulmonary effort is normal.     Breath sounds: Normal breath sounds. No wheezing, rhonchi or rales.  Musculoskeletal:         General: Tenderness present. No swelling or deformity.     Cervical back: Normal range of motion and neck supple. Tenderness present.  Lymphadenopathy:     Cervical: No cervical adenopathy.  Skin:    General: Skin is warm and dry.     Capillary Refill: Capillary refill takes 2 to 3 seconds.     Findings: Bruising present.  Neurological:     General: No focal deficit present.     Mental Status: She is alert and oriented to person, place, and time.      UC Treatments / Results  Labs (all labs ordered are listed, but only abnormal results are displayed) Labs Reviewed  CBC WITH DIFFERENTIAL/PLATELET - Abnormal; Notable for the following components:      Result Value   WBC 12.8 (*)    RBC 2.92 (*)    Hemoglobin 8.7 (*)    HCT 27.2 (*)    Neutro Abs 9.7 (*)    All other components within normal limits  COMPREHENSIVE METABOLIC PANEL WITH GFR - Abnormal; Notable for the following components:   BUN 35 (*)    Creatinine, Ser 2.11 (*)    Calcium  8.7 (*)    Albumin  3.3 (*)    GFR, Estimated 31 (*)    All other components within normal limits    EKG   Radiology DG Toe 2nd Left Result Date: 09/29/2023 CLINICAL DATA:  Edema, ecchymosis, dorsal wound x 1 week status post trampoline injury. EXAM: LEFT SECOND TOE COMPARISON:  Foot radiographs 11/22/2011. FINDINGS: Possible mild osseous demineralization versus technical factors. No evidence of acute fracture or dislocation. The joint spaces are preserved. No foreign body or other focal soft tissue abnormality identified. IMPRESSION: No evidence of acute fracture or dislocation. Electronically Signed   By: Elmon Hagedorn M.D.   On: 09/29/2023 11:55    Procedures Procedures (including critical care time)  Medications Ordered in UC Medications - No data to display  Initial Impression / Assessment and Plan / UC Course  I have reviewed the triage vital signs and the nursing notes.  Pertinent labs & imaging results that were  available during my care of the patient were reviewed by me and considered in my medical decision making (see chart for details).   Patient is a pleasant, nontoxic-appearing 33 year old female presenting for evaluation of multiple complaints as outlined HPI above.    As you can see in image above, the patient's left second toe is edematous and ecchymotic with a wound on the dorsal aspect over the PIP joint.  No tenderness with palpation of the MTP joint but she does have tenderness with palpation of the proximal phalanx, PIP joint, and middle phalanx.  Distal phalanx is unremarkable.  Cap refill is 3 seconds.  I will obtain radiograph to rule out any underlying bony abnormality. With regards to the remainder of her respiratory symptoms she does have erythema of the right tympanic membrane without any appreciable effusion or pus collection.  Movement of the  auricle of the ear is tender.  The external auditory canal is patent without edema or erythema.  The patient has inflamed nasal mucosa with yellow discharge in both nares and marked tenderness to compression of bilateral maxillary sinuses.  Oropharyngeal exam is benign.  She does have tenderness to palpation of her anterior cervical region bilaterally but no appreciable lymphadenopathy.  Cardiopulmonary exam brisk lung sounds in all fields.  Given the patient has had symptoms for a week, not tested for COVID or influenza.  Given the erythema to the right TM with maxillary sinus tenderness and purulent discharge I do suspect that she has maxillary sinusitis and is developing acute otitis media on the right.  The single episode of vomiting with diarrhea may be secondary to nasal drainage and patient's history of gastroparesis.  She only had a single episode of vomiting and 2 episodes of diarrhea but no blood in either 1.  She is able to keep down fluids.  I will obtain a CBC and CMP to evaluate for any systemic infection, electrolyte abnormality, or alterations  in metabolism given patient's history of DKA.  Notified by radiology staff that patient was complaining of dizziness while awaiting her x-ray and was very pale.  They laid her down and she continues to appear pale.  When she arrives back in the department I will have staff repeat vital signs and assess orthostatic vital signs.  Left second toe x-rays independently reviewed and evaluated by me.  Impression: No evidence of fracture or dislocation.  Soft tissue swelling is present.  Radiology overread is pending. Radiology impression states possible mild osseous demineralization versus technical factors.  No evidence of acute fracture or dislocation.  The joint spaces are preserved.  No foreign body or other focal soft tissue abnormality identified.  Repeat blood pressure upon return from radiology shows a blood pressure of 102/70.  Orthostatic vital signs continue to show a low BP but no significant variations in heart rate.  CBC shows an elevated white count of 12.8.  Also shows anemia with an overall red count of 2.92 and an H&H of 8.7 and 27.2 respectively.  Platelets are normal at 233.  Neutrophil number is elevated 9.7 but no other abnormalities to differential.  Review in epic shows that patient's H&H is consistent with the levels that has been going back to December 2024.  No significant worsening of anemia.  CMP shows normal electrolytes.  BUN is 35 with a creatinine of 2.11.  Calcium  is mildly low at 8.7.  Albumin  is also low at 3.3.  Remainder the transaminases are unremarkable.  Patient's renal function is incrementally worsened versus 07/23/2023 when at that time her BUN was 49 and a creatinine 1.98.  Patient's calculated creatinine clearance is 30 mL/min.  I will discharge patient home with a diagnosis of x-ray sinusitis, right otitis media, cellulitis of left second toe, and contusion of left second toe and started on cefdinir 300 mg twice daily for 10 days.  Given that she has creatinine  clearance of 30 mL/min there is no need to do a dosage adjustment.  I will also prescribe Zofran  that she can take 4 mg every 8 hours as needed for nausea or vomiting.  If she has any increase in nausea vomiting, increased dizziness, syncope, or fever she needs to go to the ER for evaluation.   Final Clinical Impressions(s) / UC Diagnoses   Final diagnoses:  Pain of toe of left foot  Acute non-recurrent maxillary sinusitis  Non-recurrent  acute suppurative otitis media of right ear without spontaneous rupture of tympanic membrane  Cellulitis of second toe of left foot  Contusion of second toe of left foot, initial encounter  Nausea and vomiting, unspecified vomiting type     Discharge Instructions      Your x-ray did not show any evidence of bone infection or bone fracture.  Do not concern that you are developing cellulitis as a result of the wound on your toe.  Keep the wound on your toe clean, dry, and apply a dressing to protect it from further injury.  You may keep your left foot elevated is much as possible to help decrease swelling and aid in pain relief.  Use over-the-counter Tylenol  according the package instructions as needed for any fever or pain.  Use Zofran  4 mg every 8 hours as needed for nausea and vomiting.  Continue to orally rehydrate and push oral fluids to help maintain hydration.  Take the cefdinir 300 mg twice daily with food for 10 days for treatment of your sinus infection, right ear infection, and the cellulitis of your left toe.  You may use over-the-counter cough preparations according to the package instructions as needed for cough and congestion.  If you develop any increased pain, swelling, redness, pus drainage, red streaks going up your foot, or fever you need to go to the ER.  Also if you develop any increased nausea and vomiting or you cannot keep down fluids or medications, increased dizziness, or fainting you need to go to the ER for  evaluation.   ED Prescriptions     Medication Sig Dispense Auth. Provider   cefdinir (OMNICEF) 300 MG capsule Take 1 capsule (300 mg total) by mouth 2 (two) times daily for 10 days. 20 capsule Kent Pear, NP   ondansetron  (ZOFRAN -ODT) 4 MG disintegrating tablet Take 1 tablet (4 mg total) by mouth every 8 (eight) hours as needed for nausea or vomiting. 20 tablet Kent Pear, NP      PDMP not reviewed this encounter.   Kent Pear, NP 09/29/23 1218

## 2023-09-29 NOTE — Discharge Instructions (Addendum)
 Your x-ray did not show any evidence of bone infection or bone fracture.  Do not concern that you are developing cellulitis as a result of the wound on your toe.  Keep the wound on your toe clean, dry, and apply a dressing to protect it from further injury.  You may keep your left foot elevated is much as possible to help decrease swelling and aid in pain relief.  Use over-the-counter Tylenol  according the package instructions as needed for any fever or pain.  Use Zofran  4 mg every 8 hours as needed for nausea and vomiting.  Continue to orally rehydrate and push oral fluids to help maintain hydration.  Take the cefdinir 300 mg twice daily with food for 10 days for treatment of your sinus infection, right ear infection, and the cellulitis of your left toe.  You may use over-the-counter cough preparations according to the package instructions as needed for cough and congestion.  If you develop any increased pain, swelling, redness, pus drainage, red streaks going up your foot, or fever you need to go to the ER.  Also if you develop any increased nausea and vomiting or you cannot keep down fluids or medications, increased dizziness, or fainting you need to go to the ER for evaluation.

## 2023-10-03 DIAGNOSIS — Z419 Encounter for procedure for purposes other than remedying health state, unspecified: Secondary | ICD-10-CM | POA: Diagnosis not present

## 2023-10-09 ENCOUNTER — Encounter: Payer: Self-pay | Admitting: *Deleted

## 2023-10-09 ENCOUNTER — Other Ambulatory Visit: Payer: Self-pay

## 2023-10-09 ENCOUNTER — Inpatient Hospital Stay
Admission: EM | Admit: 2023-10-09 | Discharge: 2023-10-12 | DRG: 638 | Disposition: A | Discharge status: Home / Self Care | Attending: Internal Medicine | Admitting: Internal Medicine

## 2023-10-09 DIAGNOSIS — N1832 Chronic kidney disease, stage 3b: Secondary | ICD-10-CM | POA: Diagnosis present

## 2023-10-09 DIAGNOSIS — E860 Dehydration: Secondary | ICD-10-CM | POA: Diagnosis present

## 2023-10-09 DIAGNOSIS — E875 Hyperkalemia: Secondary | ICD-10-CM | POA: Diagnosis not present

## 2023-10-09 DIAGNOSIS — E1043 Type 1 diabetes mellitus with diabetic autonomic (poly)neuropathy: Secondary | ICD-10-CM | POA: Diagnosis present

## 2023-10-09 DIAGNOSIS — D75838 Other thrombocytosis: Secondary | ICD-10-CM | POA: Diagnosis present

## 2023-10-09 DIAGNOSIS — E109 Type 1 diabetes mellitus without complications: Secondary | ICD-10-CM

## 2023-10-09 DIAGNOSIS — E2740 Unspecified adrenocortical insufficiency: Secondary | ICD-10-CM | POA: Diagnosis not present

## 2023-10-09 DIAGNOSIS — E039 Hypothyroidism, unspecified: Secondary | ICD-10-CM

## 2023-10-09 DIAGNOSIS — I130 Hypertensive heart and chronic kidney disease with heart failure and stage 1 through stage 4 chronic kidney disease, or unspecified chronic kidney disease: Secondary | ICD-10-CM | POA: Diagnosis present

## 2023-10-09 DIAGNOSIS — Z452 Encounter for adjustment and management of vascular access device: Secondary | ICD-10-CM

## 2023-10-09 DIAGNOSIS — E1010 Type 1 diabetes mellitus with ketoacidosis without coma: Principal | ICD-10-CM

## 2023-10-09 DIAGNOSIS — K529 Noninfective gastroenteritis and colitis, unspecified: Secondary | ICD-10-CM | POA: Diagnosis present

## 2023-10-09 DIAGNOSIS — I5042 Chronic combined systolic (congestive) and diastolic (congestive) heart failure: Secondary | ICD-10-CM | POA: Diagnosis present

## 2023-10-09 DIAGNOSIS — Z794 Long term (current) use of insulin: Secondary | ICD-10-CM

## 2023-10-09 DIAGNOSIS — Z79890 Hormone replacement therapy (postmenopausal): Secondary | ICD-10-CM

## 2023-10-09 DIAGNOSIS — N1831 Chronic kidney disease, stage 3a: Secondary | ICD-10-CM | POA: Diagnosis present

## 2023-10-09 DIAGNOSIS — E271 Primary adrenocortical insufficiency: Secondary | ICD-10-CM | POA: Diagnosis present

## 2023-10-09 DIAGNOSIS — K3184 Gastroparesis: Secondary | ICD-10-CM | POA: Diagnosis present

## 2023-10-09 DIAGNOSIS — E1310 Other specified diabetes mellitus with ketoacidosis without coma: Principal | ICD-10-CM

## 2023-10-09 DIAGNOSIS — Z79899 Other long term (current) drug therapy: Secondary | ICD-10-CM

## 2023-10-09 DIAGNOSIS — H6690 Otitis media, unspecified, unspecified ear: Secondary | ICD-10-CM | POA: Diagnosis present

## 2023-10-09 DIAGNOSIS — D638 Anemia in other chronic diseases classified elsewhere: Secondary | ICD-10-CM | POA: Diagnosis present

## 2023-10-09 DIAGNOSIS — Z8249 Family history of ischemic heart disease and other diseases of the circulatory system: Secondary | ICD-10-CM

## 2023-10-09 DIAGNOSIS — E272 Addisonian crisis: Secondary | ICD-10-CM | POA: Diagnosis present

## 2023-10-09 DIAGNOSIS — D631 Anemia in chronic kidney disease: Secondary | ICD-10-CM | POA: Diagnosis present

## 2023-10-09 DIAGNOSIS — N179 Acute kidney failure, unspecified: Secondary | ICD-10-CM | POA: Diagnosis present

## 2023-10-09 DIAGNOSIS — E1022 Type 1 diabetes mellitus with diabetic chronic kidney disease: Secondary | ICD-10-CM | POA: Diagnosis present

## 2023-10-09 LAB — COMPREHENSIVE METABOLIC PANEL WITH GFR
ALT: 13 U/L (ref 0–44)
AST: 14 U/L — ABNORMAL LOW (ref 15–41)
Albumin: 3.5 g/dL (ref 3.5–5.0)
Alkaline Phosphatase: 92 U/L (ref 38–126)
Anion gap: 16 — ABNORMAL HIGH (ref 5–15)
BUN: 41 mg/dL — ABNORMAL HIGH (ref 6–20)
CO2: 15 mmol/L — ABNORMAL LOW (ref 22–32)
Calcium: 9.3 mg/dL (ref 8.9–10.3)
Chloride: 108 mmol/L (ref 98–111)
Creatinine, Ser: 2.36 mg/dL — ABNORMAL HIGH (ref 0.44–1.00)
GFR, Estimated: 27 mL/min — ABNORMAL LOW (ref >60)
Glucose, Bld: 306 mg/dL — ABNORMAL HIGH (ref 70–99)
Potassium: 5.3 mmol/L — ABNORMAL HIGH (ref 3.5–5.1)
Sodium: 139 mmol/L (ref 135–145)
Total Bilirubin: 1.2 mg/dL (ref 0.0–1.2)
Total Protein: 8.3 g/dL — ABNORMAL HIGH (ref 6.5–8.1)

## 2023-10-09 LAB — CBC
HCT: 29.1 % — ABNORMAL LOW (ref 36.0–46.0)
Hemoglobin: 8.9 g/dL — ABNORMAL LOW (ref 12.0–15.0)
MCH: 30.2 pg (ref 26.0–34.0)
MCHC: 30.6 g/dL (ref 30.0–36.0)
MCV: 98.6 fL (ref 80.0–100.0)
Platelets: 406 10*3/uL — ABNORMAL HIGH (ref 150–400)
RBC: 2.95 MIL/uL — ABNORMAL LOW (ref 3.87–5.11)
RDW: 12.6 % (ref 11.5–15.5)
WBC: 8.7 10*3/uL (ref 4.0–10.5)
nRBC: 0 % (ref 0.0–0.2)

## 2023-10-09 LAB — CBG MONITORING, ED: Glucose-Capillary: 303 mg/dL — ABNORMAL HIGH (ref 70–99)

## 2023-10-09 LAB — BLOOD GAS, VENOUS

## 2023-10-09 LAB — LIPASE, BLOOD: Lipase: 28 U/L (ref 11–51)

## 2023-10-09 MED ORDER — LACTATED RINGERS IV SOLN: INTRAVENOUS | Status: DC

## 2023-10-09 MED ORDER — METOCLOPRAMIDE HCL 5 MG/ML IJ SOLN
10.0000 mg | Freq: Once | INTRAMUSCULAR | Status: AC
Start: 2023-10-09 — End: 2023-10-09
  Administered 2023-10-09: 10 mg via INTRAVENOUS
  Filled 2023-10-09: qty 2

## 2023-10-09 MED ORDER — ONDANSETRON HCL 4 MG/2ML IJ SOLN: 4.0000 mg | Freq: Once | INTRAMUSCULAR | Status: AC

## 2023-10-09 MED ORDER — LACTATED RINGERS IV BOLUS
20.0000 mL/kg | Freq: Once | INTRAVENOUS | Status: AC
  Administered 2023-10-10: 980 mL via INTRAVENOUS

## 2023-10-09 MED ORDER — DEXTROSE 50 % IV SOLN: 0.0000 mL | INTRAVENOUS | Status: DC | PRN

## 2023-10-09 MED ORDER — ONDANSETRON HCL 4 MG/2ML IJ SOLN
INTRAMUSCULAR | Status: AC
  Administered 2023-10-09: 4 mg via INTRAVENOUS
  Filled 2023-10-09: qty 2

## 2023-10-09 MED ORDER — INSULIN REGULAR(HUMAN) IN NACL 100-0.9 UT/100ML-% IV SOLN
INTRAVENOUS | Status: DC
  Administered 2023-10-09: 4.4 [IU]/h via INTRAVENOUS
  Administered 2023-10-10: 0.5 [IU]/h via INTRAVENOUS
  Filled 2023-10-09: qty 100

## 2023-10-09 MED ORDER — DEXTROSE IN LACTATED RINGERS 5 % IV SOLN: INTRAVENOUS | Status: DC

## 2023-10-09 MED ORDER — SODIUM CHLORIDE 0.9 % IV BOLUS
1000.0000 mL | Freq: Once | INTRAVENOUS | Status: AC
  Administered 2023-10-09: 1000 mL via INTRAVENOUS

## 2023-10-09 NOTE — ED Provider Notes (Signed)
 [facility] Provider Note    Event Date/Time   First MD Initiated Contact with Patient 10/09/23 2202     (approximate)   History   Emesis   HPI  Ashley Camacho is a 33 y.o. female who presents to the emergency department today because of concerns for nausea and vomiting.  Symptoms started 2 days ago.  Has been severe enough she has not been able to keep down her medications.  The patient states that she has been having issues with sinus and ear ache for the past 2 weeks although felt like that was getting better.  Patient has a history of diabetes and does take insulin .  She has not checked her sugars today but otherwise has felt like her sugars have been well-maintained.  Per chart review has had DKA in the past and this does feel somewhat like that for her.     Physical Exam   Triage Vital Signs: ED Triage Vitals [10/09/23 2020]  Encounter Vitals Group     BP 111/79     Systolic BP Percentile      Diastolic BP Percentile      Pulse Rate (!) 109     Resp 18     Temp 98.8 F (37.1 C)     Temp Source Oral     SpO2 97 %     Weight 108 lb (49 kg)     Height 5\' 1"  (1.549 m)     Head Circumference      Peak Flow      Pain Score 7     Pain Loc      Pain Education      Exclude from Growth Chart     Most recent vital signs: Vitals:   10/09/23 2020  BP: 111/79  Pulse: (!) 109  Resp: 18  Temp: 98.8 F (37.1 C)  SpO2: 97%   General: Awake, alert, oriented. CV:  Good peripheral perfusion. Tachycardia. Resp:  Normal effort. Lungs clear. Abd:  No distention. Non tender.  ED Results / Procedures / Treatments   Labs (all labs ordered are listed, but only abnormal results are displayed) Labs Reviewed  COMPREHENSIVE METABOLIC PANEL WITH GFR - Abnormal; Notable for the following components:      Result Value   Potassium 5.3 (*)    CO2 15 (*)    Glucose, Bld 306 (*)    BUN 41 (*)    Creatinine, Ser 2.36 (*)    Total Protein 8.3 (*)    AST  14 (*)    GFR, Estimated 27 (*)    Anion gap 16 (*)    All other components within normal limits  CBC - Abnormal; Notable for the following components:   RBC 2.95 (*)    Hemoglobin 8.9 (*)    HCT 29.1 (*)    Platelets 406 (*)    All other components within normal limits  BLOOD GAS, VENOUS - Abnormal; Notable for the following components:   pH, Ven 7.19 (*)    pCO2, Ven 40 (*)    Bicarbonate 15.3 (*)    Acid-base deficit 12.3 (*)    All other components within normal limits  CBG MONITORING, ED - Abnormal; Notable for the following components:   Glucose-Capillary 303 (*)    All other components within normal limits  LIPASE, BLOOD  URINALYSIS, ROUTINE W REFLEX MICROSCOPIC  BETA-HYDROXYBUTYRIC ACID  BASIC METABOLIC PANEL WITH GFR  BASIC METABOLIC PANEL WITH GFR  BASIC  METABOLIC PANEL WITH GFR  BASIC METABOLIC PANEL WITH GFR  POC URINE PREG, ED     EKG  None   RADIOLOGY None   PROCEDURES:  Critical Care performed: Yes  CRITICAL CARE Performed by: Marylynn Soho   Total critical care time: 30 minutes  Critical care time was exclusive of separately billable procedures and treating other patients.  Critical care was necessary to treat or prevent imminent or life-threatening deterioration.  Critical care was time spent personally by me on the following activities: development of treatment plan with patient and/or surrogate as well as nursing, discussions with consultants, evaluation of patient's response to treatment, examination of patient, obtaining history from patient or surrogate, ordering and performing treatments and interventions, ordering and review of laboratory studies, ordering and review of radiographic studies, pulse oximetry and re-evaluation of patient's condition.   Procedures    MEDICATIONS ORDERED IN ED: Medications  sodium chloride  0.9 % bolus 1,000 mL (has no administration in time range)     IMPRESSION / MDM / ASSESSMENT AND PLAN / ED  COURSE  I reviewed the triage vital signs and the nursing notes.                              Differential diagnosis includes, but is not limited to, gastroenteritis, hyperglycemia, DKA  Patient's presentation is most consistent with acute presentation with potential threat to life or bodily function.   The patient is on the cardiac monitor to evaluate for evidence of arrhythmia and/or significant heart rate changes.  Patient presented to the emergency department today because of concerns for nausea and vomiting for the past 2 days.  Patient with history of diabetes.  Initial blood work was concerning for hyperglycemia and elevated anion gap.  IV fluids were started.  Did have VBG run.  This was concerning for acidosis.  At this time I do have concerns for DKA.  Will start IV insulin .  Discussed findings concern with patient.  Discussed with Dr. Achilles Holes with the hospitalist service who will evaluate for admission.    FINAL CLINICAL IMPRESSION(S) / ED DIAGNOSES   Final diagnoses:  Diabetic ketoacidosis without coma associated with other specified diabetes mellitus (HCC)        Rx / DC Orders       Note:  This document was prepared using Dragon voice recognition software and may include unintentional dictation errors.    Marylynn Soho, MD 10/09/23 [phone]

## 2023-10-09 NOTE — ED Triage Notes (Signed)
 Pt to triage via wheelchair.  Pt has n/v for 2-3 days.   Pt denies abd pain .  Pt also has left earache.  Pt alert.  Pt pale.

## 2023-10-10 ENCOUNTER — Encounter: Payer: Self-pay | Admitting: Vascular Surgery

## 2023-10-10 ENCOUNTER — Encounter
Admission: EM | Disposition: A | Discharge status: Home / Self Care | Payer: Self-pay | Source: Home / Self Care | Attending: Internal Medicine

## 2023-10-10 DIAGNOSIS — E272 Addisonian crisis: Secondary | ICD-10-CM | POA: Diagnosis not present

## 2023-10-10 DIAGNOSIS — N1832 Chronic kidney disease, stage 3b: Secondary | ICD-10-CM | POA: Diagnosis not present

## 2023-10-10 DIAGNOSIS — Z79890 Hormone replacement therapy (postmenopausal): Secondary | ICD-10-CM | POA: Diagnosis not present

## 2023-10-10 DIAGNOSIS — R7881 Bacteremia: Secondary | ICD-10-CM | POA: Diagnosis not present

## 2023-10-10 DIAGNOSIS — I5042 Chronic combined systolic (congestive) and diastolic (congestive) heart failure: Secondary | ICD-10-CM | POA: Diagnosis not present

## 2023-10-10 DIAGNOSIS — H6690 Otitis media, unspecified, unspecified ear: Secondary | ICD-10-CM | POA: Diagnosis present

## 2023-10-10 DIAGNOSIS — Z452 Encounter for adjustment and management of vascular access device: Secondary | ICD-10-CM | POA: Diagnosis not present

## 2023-10-10 DIAGNOSIS — E2740 Unspecified adrenocortical insufficiency: Secondary | ICD-10-CM | POA: Diagnosis not present

## 2023-10-10 DIAGNOSIS — K529 Noninfective gastroenteritis and colitis, unspecified: Secondary | ICD-10-CM | POA: Diagnosis present

## 2023-10-10 DIAGNOSIS — K3184 Gastroparesis: Secondary | ICD-10-CM | POA: Diagnosis present

## 2023-10-10 DIAGNOSIS — Z794 Long term (current) use of insulin: Secondary | ICD-10-CM | POA: Diagnosis not present

## 2023-10-10 DIAGNOSIS — E1310 Other specified diabetes mellitus with ketoacidosis without coma: Secondary | ICD-10-CM

## 2023-10-10 DIAGNOSIS — E039 Hypothyroidism, unspecified: Secondary | ICD-10-CM | POA: Diagnosis not present

## 2023-10-10 DIAGNOSIS — E271 Primary adrenocortical insufficiency: Secondary | ICD-10-CM | POA: Diagnosis not present

## 2023-10-10 DIAGNOSIS — E1010 Type 1 diabetes mellitus with ketoacidosis without coma: Secondary | ICD-10-CM | POA: Diagnosis not present

## 2023-10-10 DIAGNOSIS — E875 Hyperkalemia: Secondary | ICD-10-CM | POA: Diagnosis not present

## 2023-10-10 DIAGNOSIS — Z79899 Other long term (current) drug therapy: Secondary | ICD-10-CM | POA: Diagnosis not present

## 2023-10-10 DIAGNOSIS — E1022 Type 1 diabetes mellitus with diabetic chronic kidney disease: Secondary | ICD-10-CM

## 2023-10-10 DIAGNOSIS — E860 Dehydration: Secondary | ICD-10-CM | POA: Diagnosis not present

## 2023-10-10 DIAGNOSIS — D75838 Other thrombocytosis: Secondary | ICD-10-CM | POA: Diagnosis present

## 2023-10-10 DIAGNOSIS — I130 Hypertensive heart and chronic kidney disease with heart failure and stage 1 through stage 4 chronic kidney disease, or unspecified chronic kidney disease: Secondary | ICD-10-CM | POA: Diagnosis not present

## 2023-10-10 DIAGNOSIS — A0811 Acute gastroenteropathy due to Norwalk agent: Secondary | ICD-10-CM | POA: Diagnosis not present

## 2023-10-10 DIAGNOSIS — N179 Acute kidney failure, unspecified: Secondary | ICD-10-CM | POA: Diagnosis not present

## 2023-10-10 DIAGNOSIS — N189 Chronic kidney disease, unspecified: Secondary | ICD-10-CM | POA: Diagnosis not present

## 2023-10-10 DIAGNOSIS — N390 Urinary tract infection, site not specified: Secondary | ICD-10-CM | POA: Diagnosis not present

## 2023-10-10 DIAGNOSIS — A419 Sepsis, unspecified organism: Secondary | ICD-10-CM | POA: Diagnosis not present

## 2023-10-10 DIAGNOSIS — Z8249 Family history of ischemic heart disease and other diseases of the circulatory system: Secondary | ICD-10-CM | POA: Diagnosis not present

## 2023-10-10 DIAGNOSIS — N1831 Chronic kidney disease, stage 3a: Secondary | ICD-10-CM | POA: Diagnosis not present

## 2023-10-10 DIAGNOSIS — E1043 Type 1 diabetes mellitus with diabetic autonomic (poly)neuropathy: Secondary | ICD-10-CM | POA: Diagnosis not present

## 2023-10-10 DIAGNOSIS — D631 Anemia in chronic kidney disease: Secondary | ICD-10-CM | POA: Diagnosis not present

## 2023-10-10 HISTORY — PX: CENTRAL LINE INSERTION: CATH118232

## 2023-10-10 LAB — BASIC METABOLIC PANEL WITH GFR
Anion gap: 10 (ref 5–15)
Anion gap: 10 (ref 5–15)
Anion gap: 12 (ref 5–15)
Anion gap: 16 — ABNORMAL HIGH (ref 5–15)
Anion gap: 8 (ref 5–15)
Anion gap: 9 (ref 5–15)
BUN: 25 mg/dL — ABNORMAL HIGH (ref 6–20)
BUN: 28 mg/dL — ABNORMAL HIGH (ref 6–20)
BUN: 33 mg/dL — ABNORMAL HIGH (ref 6–20)
BUN: 36 mg/dL — ABNORMAL HIGH (ref 6–20)
BUN: 37 mg/dL — ABNORMAL HIGH (ref 6–20)
BUN: 44 mg/dL — ABNORMAL HIGH (ref 6–20)
CO2: 12 mmol/L — ABNORMAL LOW (ref 22–32)
CO2: 16 mmol/L — ABNORMAL LOW (ref 22–32)
CO2: 18 mmol/L — ABNORMAL LOW (ref 22–32)
CO2: 18 mmol/L — ABNORMAL LOW (ref 22–32)
CO2: 19 mmol/L — ABNORMAL LOW (ref 22–32)
CO2: 19 mmol/L — ABNORMAL LOW (ref 22–32)
Calcium: 8.2 mg/dL — ABNORMAL LOW (ref 8.9–10.3)
Calcium: 8.4 mg/dL — ABNORMAL LOW (ref 8.9–10.3)
Calcium: 8.4 mg/dL — ABNORMAL LOW (ref 8.9–10.3)
Calcium: 8.5 mg/dL — ABNORMAL LOW (ref 8.9–10.3)
Calcium: 8.7 mg/dL — ABNORMAL LOW (ref 8.9–10.3)
Calcium: 8.8 mg/dL — ABNORMAL LOW (ref 8.9–10.3)
Chloride: 109 mmol/L (ref 98–111)
Chloride: 109 mmol/L (ref 98–111)
Chloride: 110 mmol/L (ref 98–111)
Chloride: 111 mmol/L (ref 98–111)
Chloride: 113 mmol/L — ABNORMAL HIGH (ref 98–111)
Chloride: 114 mmol/L — ABNORMAL HIGH (ref 98–111)
Creatinine, Ser: 1.54 mg/dL — ABNORMAL HIGH (ref 0.44–1.00)
Creatinine, Ser: 1.67 mg/dL — ABNORMAL HIGH (ref 0.44–1.00)
Creatinine, Ser: 1.76 mg/dL — ABNORMAL HIGH (ref 0.44–1.00)
Creatinine, Ser: 1.92 mg/dL — ABNORMAL HIGH (ref 0.44–1.00)
Creatinine, Ser: 2.02 mg/dL — ABNORMAL HIGH (ref 0.44–1.00)
Creatinine, Ser: 2.35 mg/dL — ABNORMAL HIGH (ref 0.44–1.00)
GFR, Estimated: 28 mL/min — ABNORMAL LOW (ref >60)
GFR, Estimated: 33 mL/min — ABNORMAL LOW (ref >60)
GFR, Estimated: 35 mL/min — ABNORMAL LOW (ref >60)
GFR, Estimated: 39 mL/min — ABNORMAL LOW (ref >60)
GFR, Estimated: 41 mL/min — ABNORMAL LOW (ref >60)
GFR, Estimated: 46 mL/min — ABNORMAL LOW (ref >60)
Glucose, Bld: 106 mg/dL — ABNORMAL HIGH (ref 70–99)
Glucose, Bld: 135 mg/dL — ABNORMAL HIGH (ref 70–99)
Glucose, Bld: 148 mg/dL — ABNORMAL HIGH (ref 70–99)
Glucose, Bld: 159 mg/dL — ABNORMAL HIGH (ref 70–99)
Glucose, Bld: 164 mg/dL — ABNORMAL HIGH (ref 70–99)
Glucose, Bld: 292 mg/dL — ABNORMAL HIGH (ref 70–99)
Potassium: 4.1 mmol/L (ref 3.5–5.1)
Potassium: 4.3 mmol/L (ref 3.5–5.1)
Potassium: 4.5 mmol/L (ref 3.5–5.1)
Potassium: 4.9 mmol/L (ref 3.5–5.1)
Potassium: 5.1 mmol/L (ref 3.5–5.1)
Potassium: 5.7 mmol/L — ABNORMAL HIGH (ref 3.5–5.1)
Sodium: 136 mmol/L (ref 135–145)
Sodium: 137 mmol/L (ref 135–145)
Sodium: 138 mmol/L (ref 135–145)
Sodium: 139 mmol/L (ref 135–145)
Sodium: 141 mmol/L (ref 135–145)
Sodium: 142 mmol/L (ref 135–145)

## 2023-10-10 LAB — GLUCOSE, CAPILLARY
Glucose-Capillary: 105 mg/dL — ABNORMAL HIGH (ref 70–99)
Glucose-Capillary: 149 mg/dL — ABNORMAL HIGH (ref 70–99)
Glucose-Capillary: 153 mg/dL — ABNORMAL HIGH (ref 70–99)
Glucose-Capillary: 157 mg/dL — ABNORMAL HIGH (ref 70–99)
Glucose-Capillary: 172 mg/dL — ABNORMAL HIGH (ref 70–99)
Glucose-Capillary: 175 mg/dL — ABNORMAL HIGH (ref 70–99)
Glucose-Capillary: 175 mg/dL — ABNORMAL HIGH (ref 70–99)
Glucose-Capillary: 236 mg/dL — ABNORMAL HIGH (ref 70–99)

## 2023-10-10 LAB — URINALYSIS, ROUTINE W REFLEX MICROSCOPIC
Bilirubin Urine: NEGATIVE
Glucose, UA: NEGATIVE mg/dL
Hgb urine dipstick: NEGATIVE
Ketones, ur: NEGATIVE mg/dL
Nitrite: NEGATIVE
Protein, ur: 30 mg/dL — AB
Specific Gravity, Urine: 1.021 (ref 1.005–1.030)
WBC, UA: >50 WBC/hpf (ref 0–5)
pH: 5 (ref 5.0–8.0)

## 2023-10-10 LAB — BLOOD GAS, VENOUS
Bicarbonate: 15.3 mmol/L — ABNORMAL LOW (ref 20.0–28.0)
O2 Saturation: 43.9 mmol/L — ABNORMAL HIGH (ref 0.0–2.0)
Patient temperature: 37
Patient temperature: 43.9 %
pCO2, Ven: 40 mmHg — ABNORMAL LOW (ref 44–60)
pH, Ven: 7.19 — CL (ref 7.25–7.43)
pO2, Ven: 15.3 mmol/L — ABNORMAL LOW (ref 32–45)

## 2023-10-10 LAB — CBG MONITORING, ED
Glucose-Capillary: 120 mg/dL — ABNORMAL HIGH (ref 70–99)
Glucose-Capillary: 121 mg/dL — ABNORMAL HIGH (ref 70–99)
Glucose-Capillary: 130 mg/dL — ABNORMAL HIGH (ref 70–99)
Glucose-Capillary: 134 mg/dL — ABNORMAL HIGH (ref 70–99)
Glucose-Capillary: 137 mg/dL — ABNORMAL HIGH (ref 70–99)
Glucose-Capillary: 144 mg/dL — ABNORMAL HIGH (ref 70–99)
Glucose-Capillary: 144 mg/dL — ABNORMAL HIGH (ref 70–99)
Glucose-Capillary: 145 mg/dL — ABNORMAL HIGH (ref 70–99)
Glucose-Capillary: 146 mg/dL — ABNORMAL HIGH (ref 70–99)
Glucose-Capillary: 154 mg/dL — ABNORMAL HIGH (ref 70–99)
Glucose-Capillary: 156 mg/dL — ABNORMAL HIGH (ref 70–99)
Glucose-Capillary: 177 mg/dL — ABNORMAL HIGH (ref 70–99)
Glucose-Capillary: 245 mg/dL — ABNORMAL HIGH (ref 70–99)
Glucose-Capillary: 67 mg/dL — ABNORMAL LOW (ref 70–99)
Glucose-Capillary: 84 mg/dL (ref 70–99)

## 2023-10-10 LAB — MAGNESIUM: Magnesium: 2 mg/dL (ref 1.7–2.4)

## 2023-10-10 LAB — CBC
HCT: 22.1 % — ABNORMAL LOW (ref 36.0–46.0)
Hemoglobin: 6.9 g/dL — ABNORMAL LOW (ref 12.0–15.0)
MCH: 30.1 pg (ref 26.0–34.0)
MCHC: 31.2 g/dL (ref 30.0–36.0)
MCV: 96.5 fL (ref 80.0–100.0)
Platelets: 325 10*3/uL (ref 150–400)
RBC: 2.29 MIL/uL — ABNORMAL LOW (ref 3.87–5.11)
RDW: 12.4 % (ref 11.5–15.5)
WBC: 9.5 10*3/uL (ref 4.0–10.5)
nRBC: 0 % (ref 0.0–0.2)

## 2023-10-10 LAB — HEMOGLOBIN A1C
Hgb A1c MFr Bld: 8 % — ABNORMAL HIGH (ref 4.8–5.6)
Mean Plasma Glucose: 182.9 mg/dL

## 2023-10-10 LAB — MRSA NEXT GEN BY PCR, NASAL: MRSA by PCR Next Gen: NOT DETECTED

## 2023-10-10 LAB — BETA-HYDROXYBUTYRIC ACID
Beta-Hydroxybutyric Acid: 5.2 mmol/L — ABNORMAL HIGH (ref 0.05–0.27)
Beta-Hydroxybutyric Acid: 2.18 mmol/L — ABNORMAL HIGH (ref 0.05–0.27)
Beta-Hydroxybutyric Acid: 0.36 mmol/L — ABNORMAL HIGH (ref 0.05–0.27)
Beta-Hydroxybutyric Acid: 2.13 mmol/L — ABNORMAL HIGH (ref 0.05–0.27)
Beta-Hydroxybutyric Acid: 2.68 mmol/L — ABNORMAL HIGH (ref 0.05–0.27)
Beta-Hydroxybutyric Acid: 2.53 mmol/L — ABNORMAL HIGH (ref 0.05–0.27)
Beta-Hydroxybutyric Acid: 0.39 mmol/L — ABNORMAL HIGH (ref 0.05–0.27)

## 2023-10-10 LAB — HEMOGLOBIN: Hemoglobin: 6.3 g/dL — ABNORMAL LOW (ref 12.0–15.0)

## 2023-10-10 LAB — PHOSPHORUS: Phosphorus: 3.1 mg/dL (ref 2.5–4.6)

## 2023-10-10 LAB — PREPARE RBC (CROSSMATCH)

## 2023-10-10 SURGERY — CENTRAL LINE INSERTION
Anesthesia: LOCAL

## 2023-10-10 MED ORDER — DEXTROSE 50 % IV SOLN: 0.0000 mL | INTRAVENOUS | Status: DC | PRN

## 2023-10-10 MED ORDER — ACETAMINOPHEN 650 MG RE SUPP
650.0000 mg | Freq: Four times a day (QID) | RECTAL | Status: DC | PRN
Start: 2023-10-10 — End: 2023-10-12

## 2023-10-10 MED ORDER — INSULIN REGULAR(HUMAN) IN NACL 100-0.9 UT/100ML-% IV SOLN
INTRAVENOUS | Status: AC
  Administered 2023-10-10: 1.6 [IU]/h via INTRAVENOUS

## 2023-10-10 MED ORDER — LIDOCAINE HCL (PF) 1 % IJ SOLN
INTRAMUSCULAR | Status: DC | PRN
  Administered 2023-10-10: 5 mL

## 2023-10-10 MED ORDER — INSULIN ASPART 100 UNIT/ML IJ SOLN: 0.0000 [IU] | Freq: Three times a day (TID) | INTRAMUSCULAR | Status: DC

## 2023-10-10 MED ORDER — INSULIN REGULAR(HUMAN) IN NACL 100-0.9 UT/100ML-% IV SOLN: INTRAVENOUS | Status: DC

## 2023-10-10 MED ORDER — HYDROCORTISONE 10 MG PO TABS: 10.0000 mg | ORAL_TABLET | Freq: Every day | ORAL | Status: DC

## 2023-10-10 MED ORDER — SODIUM CHLORIDE 0.9 % IV SOLN
1.0000 g | INTRAVENOUS | Status: DC
  Administered 2023-10-10 – 2023-10-11 (×2): 1 g via INTRAVENOUS
  Filled 2023-10-10 (×3): qty 10

## 2023-10-10 MED ORDER — TRAZODONE HCL 50 MG PO TABS: 25.0000 mg | ORAL_TABLET | Freq: Every evening | ORAL | Status: DC | PRN

## 2023-10-10 MED ORDER — ENOXAPARIN SODIUM 30 MG/0.3ML IJ SOSY
30.0000 mg | PREFILLED_SYRINGE | INTRAMUSCULAR | Status: DC
  Administered 2023-10-10: 30 mg via SUBCUTANEOUS
  Filled 2023-10-10: qty 0.3

## 2023-10-10 MED ORDER — MIDODRINE HCL 5 MG PO TABS: 2.5000 mg | ORAL_TABLET | Freq: Three times a day (TID) | ORAL | Status: DC | PRN

## 2023-10-10 MED ORDER — LACTATED RINGERS IV SOLN: INTRAVENOUS | Status: DC

## 2023-10-10 MED ORDER — ACETAMINOPHEN 325 MG PO TABS
650.0000 mg | ORAL_TABLET | Freq: Four times a day (QID) | ORAL | Status: DC | PRN
  Administered 2023-10-10: 650 mg via ORAL
  Filled 2023-10-10: qty 2

## 2023-10-10 MED ORDER — INSULIN GLARGINE-YFGN 100 UNIT/ML ~~LOC~~ SOLN
6.0000 [IU] | Freq: Every day | SUBCUTANEOUS | Status: DC
  Administered 2023-10-10: 6 [IU] via SUBCUTANEOUS
  Filled 2023-10-10: qty 0.06

## 2023-10-10 MED ORDER — INSULIN GLARGINE-YFGN 100 UNIT/ML ~~LOC~~ SOLN: 7.0000 [IU] | Freq: Every day | SUBCUTANEOUS | Status: DC

## 2023-10-10 MED ORDER — DIVALPROEX SODIUM 250 MG PO DR TAB
250.0000 mg | DELAYED_RELEASE_TABLET | Freq: Every day | ORAL | Status: DC
  Administered 2023-10-10 – 2023-10-12 (×3): 250 mg via ORAL
  Filled 2023-10-10 (×3): qty 1

## 2023-10-10 MED ORDER — ONDANSETRON 4 MG PO TBDP
4.0000 mg | ORAL_TABLET | Freq: Three times a day (TID) | ORAL | Status: DC | PRN
  Administered 2023-10-10: 4 mg via ORAL
  Filled 2023-10-10: qty 1

## 2023-10-10 MED ORDER — VITAMIN D (ERGOCALCIFEROL) 1.25 MG (50000 UNIT) PO CAPS: 50000.0000 [IU] | ORAL_CAPSULE | ORAL | Status: DC

## 2023-10-10 MED ORDER — LOPERAMIDE HCL 2 MG PO CAPS
4.0000 mg | ORAL_CAPSULE | ORAL | Status: DC | PRN
  Administered 2023-10-11 – 2023-10-12 (×2): 4 mg via ORAL
  Filled 2023-10-10 (×2): qty 2

## 2023-10-10 MED ORDER — HYDROCORTISONE SOD SUC (PF) 100 MG IJ SOLR
100.0000 mg | Freq: Three times a day (TID) | INTRAMUSCULAR | Status: DC
  Administered 2023-10-10 – 2023-10-11 (×4): 100 mg via INTRAVENOUS
  Filled 2023-10-10 (×5): qty 2

## 2023-10-10 MED ORDER — POTASSIUM CHLORIDE 10 MEQ/100ML IV SOLN
10.0000 meq | INTRAVENOUS | Status: AC
  Administered 2023-10-10 (×3): 10 meq via INTRAVENOUS
  Filled 2023-10-10 (×4): qty 100

## 2023-10-10 MED ORDER — MAGNESIUM HYDROXIDE 400 MG/5ML PO SUSP: 30.0000 mL | Freq: Every day | ORAL | Status: DC | PRN

## 2023-10-10 MED ORDER — DEXTROSE IN LACTATED RINGERS 5 % IV SOLN: INTRAVENOUS | Status: DC

## 2023-10-10 MED ORDER — INSULIN ASPART 100 UNIT/ML FLEXPEN: 10.0000 [IU] | PEN_INJECTOR | Freq: Three times a day (TID) | SUBCUTANEOUS | Status: DC

## 2023-10-10 MED ORDER — SODIUM CHLORIDE 0.9% IV SOLUTION
Freq: Once | INTRAVENOUS | Status: DC
  Filled 2023-10-10: qty 250

## 2023-10-10 MED ORDER — CHLORHEXIDINE GLUCONATE CLOTH 2 % EX PADS
6.0000 | MEDICATED_PAD | Freq: Every day | CUTANEOUS | Status: DC
  Administered 2023-10-10 – 2023-10-11 (×2): 6 via TOPICAL

## 2023-10-10 MED ORDER — LACTATED RINGERS IV BOLUS: 20.0000 mL/kg | Freq: Once | INTRAVENOUS | Status: DC

## 2023-10-10 MED ORDER — LACTATED RINGERS IV BOLUS
20.0000 mL/kg | Freq: Once | INTRAVENOUS | Status: AC
  Administered 2023-10-10: 1000 mL via INTRAVENOUS

## 2023-10-10 MED ORDER — HYDROCORTISONE 10 MG PO TABS
20.0000 mg | ORAL_TABLET | Freq: Every day | ORAL | Status: DC
  Administered 2023-10-10: 20 mg via ORAL
  Filled 2023-10-10: qty 2

## 2023-10-10 MED ORDER — ENOXAPARIN SODIUM 40 MG/0.4ML IJ SOSY
40.0000 mg | PREFILLED_SYRINGE | INTRAMUSCULAR | Status: DC
  Administered 2023-10-11 – 2023-10-12 (×2): 40 mg via SUBCUTANEOUS
  Filled 2023-10-10 (×2): qty 0.4

## 2023-10-10 MED ORDER — LEVOTHYROXINE SODIUM 50 MCG PO TABS
75.0000 ug | ORAL_TABLET | Freq: Every day | ORAL | Status: DC
  Administered 2023-10-10 – 2023-10-12 (×3): 75 ug via ORAL
  Filled 2023-10-10 (×2): qty 2
  Filled 2023-10-10: qty 1

## 2023-10-10 SURGICAL SUPPLY — 3 items
BIOPATCH RED 1 DISK 7.0 (GAUZE/BANDAGES/DRESSINGS) IMPLANT
COVER PROBE ULTRASOUND 5X96 (MISCELLANEOUS) IMPLANT
KIT CV MULTILUMEN 7FR 20 (SET/KITS/TRAYS/PACK) IMPLANT

## 2023-10-10 NOTE — ED Notes (Signed)
 MD made aware of patient's CBG of 67. OJ given. MD wants to give Hydrocortisone  to help increase pt CBG.

## 2023-10-10 NOTE — Consult Note (Signed)
 Hospital Consult    Reason for Consult:  IV Access needed for Transfusion  Requesting Physician:  Dr Donaciano Frizzle MD  MRN #:  [id number]  History of Present Illness: This is a 33 y.o. female who presents to the emergency department today because of concerns for nausea and vomiting. Symptoms started 2 days ago. Has been severe enough she has not been able to keep down her medications. The patient states that she has been having issues with sinus and ear ache for the past 2 weeks although felt like that was getting better. Patient has a history of diabetes and does take insulin . She has not checked her sugars today but otherwise has felt like her sugars have been well-maintained. Per chart review has had DKA in the past and this does feel somewhat like that for her.   Upon workup patient was hemoglobin of 6.9 and hematocrit of  22.1 with a beta hydroxybutyric Acid of 2.18 and a glucose of 159.  Emergency room had difficulty getting access.  He only asked to see if the IV team was a 24-gauge the anterior right hand 20.  This will not be of access to right medications and blood therefore she needs central line access.  Vascular surgery was consulted.  Past Medical History:  Diagnosis Date   Acute metabolic encephalopathy 06/10/2022   Adrenal insufficiency (HCC)    Anemia    CHF (congestive heart failure) (HCC)    Gastroparesis    Hypertension    Hypotension    Intractable nausea and vomiting 11/13/2022   Migraine    Thyroid  disease    Type 1 diabetes [facility])     Past Surgical History:  Procedure Laterality Date   BIOPSY  01/14/2021   Procedure: BIOPSY;  Surgeon: Lindle Rhea, MD;  Location[facility] ENDOSCOPY;  Service: Gastroenterology;;   CESAREAN SECTION     x2   CESAREAN SECTION WITH BILATERAL TUBAL LIGATION N/A 02/17/2021   Procedure: CESAREAN SECTION WITH BILATERAL TUBAL LIGATION;  Surgeon: Malka Sea, DO;  Location: [facility] LD ORS;  Service: Obstetrics;  Laterality: N/A;    ESOPHAGOGASTRODUODENOSCOPY (EGD) WITH PROPOFOL  N/A 01/14/2021   Procedure: ESOPHAGOGASTRODUODENOSCOPY (EGD) WITH PROPOFOL ;  Surgeon: Lindle Rhea, MD;  Location: [facility] ENDOSCOPY;  Service: Gastroenterology;  Laterality: N/A;   IR BONE MARROW BIOPSY & ASPIRATION  12/16/2022   IR REPLC DUODEN/JEJUNO TUBE PERCUT W/FLUORO  11/22/2022   JEJUNOSTOMY N/A 06/13/2022   Procedure: Tonette Franco;  Surgeon: Alben Alma, MD;  Location: [facility] ORS;  Service: General;  Laterality: N/A;   MOUTH SURGERY      No Known Allergies  Prior to Admission medications   Medication Sig Start Date End Date Taking? Authorizing Provider  acetaminophen  (TYLENOL ) 500 MG tablet Take 2 tablets (1,000 mg total) by mouth every 6 (six) hours as needed for headache, fever or moderate pain. 08/05/22  Yes Ezzard Holms, MD  cefdinir  (OMNICEF ) 300 MG capsule Take 1 capsule (300 mg total) by mouth 2 (two) times daily for 10 days. 09/29/23 10/10/23 Yes Kent Pear, NP  divalproex  (DEPAKOTE ) 250 MG DR tablet Take 250 mg by mouth daily. 09/07/23 12/06/23 Yes [provider]  hydrocortisone  (CORTEF ) 10 MG tablet Take 2 tablets (20 mg total) by mouth daily with breakfast AND 1 tablet (10 mg total) daily with supper. 05/27/23  Yes Sreenath, Sudheer B, MD  insulin  aspart (NOVOLOG  FLEXPEN) 100 UNIT/ML FlexPen Inject 2 units into the skin 3 (three) times daily with meals. In addition, inject 0-9 Units into  the skin 3 (three) times daily with meals based on your blood sugar. Also inject 0-5 units into the skin every night at bedtime based on your blood sugar. See att Patient taking differently: Inject into the skin 3-4 times daily with meals and as needed. In addition, inject 0-9 Units into the skin 3 (three) times daily with meals based on your blood sugar. Also inject 0-5 units into the skin every night at bedtime based on your blood sugar. See att 06/01/23  Yes Ostwalt, [name], PA-C  insulin  glargine (LANTUS ) 100 UNIT/ML Solostar Pen Inject 6-7  Units into the skin daily. Patient taking differently: Inject 10 Units into the skin daily. 05/10/23  Yes Ostwalt, [name], PA-C  levothyroxine  (SYNTHROID ) 75 MCG tablet TAKE 1 TABLET (75 MCG TOTAL) BY MOUTH DAILY AT 6 (SIX) AM. 06/20/23  Yes Ostwalt, [name], PA-C  loperamide  (IMODIUM ) 2 MG capsule Take 2 capsules (4 mg total) by mouth as needed for diarrhea or loose stools. 11/15/22  Yes Ree Candy, MD  midodrine  (PROAMATINE ) 2.5 MG tablet Take 1 tablet (2.5 mg total) by mouth 3 (three) times daily with meals as needed (systolic press less than 100). 05/26/23  Yes Wittenborn, Bernardo Bridgeman, NP  ondansetron  (ZOFRAN -ODT) 4 MG disintegrating tablet Take 1 tablet (4 mg total) by mouth every 8 (eight) hours as needed for nausea or vomiting. 09/29/23  Yes Kent Pear, NP  promethazine  (PHENERGAN ) 25 MG tablet Take 0.5 tablets (12.5 mg total) by mouth every 8 (eight) hours as needed for nausea or vomiting. 04/10/23  Yes Aisha Hove, MD  Vitamin D , Ergocalciferol , (DRISDOL ) 1.25 MG (50000 UNIT) CAPS capsule Take 50,000 Units by mouth once a week. (Sunday) 08/08/23  Yes [provider]  Blood Pressure Monitoring (BLOOD PRESSURE CUFF) MISC Use as directed 05/26/23   Morey Ar, NP  Continuous Glucose Sensor (DEXCOM G7 SENSOR) MISC SMARTSIG:1 Each Every 10 Days    [provider]  Nutritional Supplements (FEEDING SUPPLEMENT, GLUCERNA 1.5 CAL,) LIQD Place 640 mLs into feeding tube at bedtime. Patient not taking: Reported on 08/25/2023 11/15/22   Ree Candy, MD      Social History   Socioeconomic History   Marital status: Single    Spouse name: Not on file   Number of children: Not on file   Years of education: Not on file   Highest education level: 9th grade  Occupational History   Occupation: home maker  Tobacco Use   Smoking status: Never    Passive exposure: Never   Smokeless tobacco: Never  Vaping Use   Vaping status: Never Used  Substance and Sexual  Activity   Alcohol use: No   Drug use: No   Sexual activity: Not Currently    Birth control/protection: None  Other Topics Concern   Not on file  Social History Narrative   Not on file   Social Drivers of Health   Financial Resource Strain: Low Risk  (08/04/2023)   Received from [facility]   Overall Financial Resource Strain (CARDIA)    Difficulty of Paying Living Expenses: Not very hard  Recent Concern: Financial Resource Strain - High Risk (05/09/2023)   Overall Financial Resource Strain (CARDIA)    Difficulty of Paying Living Expenses: Very hard  Food Insecurity: No Food Insecurity (08/04/2023)   Received from [facility]   Hunger Vital Sign    Worried About Running Out of Food in the Last Year: Never true    Ran Out of Food in  the Last Year: Never true  Transportation Needs: Unmet Transportation Needs (08/04/2023)   Received from [facility] - Transportation    In the past 12 months, has lack of transportation kept you from medical appointments or from getting medications?: Yes    Lack of Transportation (Non-Medical): No  Physical Activity: Unknown (05/09/2023)   Exercise Vital Sign    Days of Exercise per Week: 0 days    Minutes of Exercise per Session: Not on file  Stress: Stress Concern Present (05/09/2023)   [employer] of Occupational Health - Occupational Stress Questionnaire    Feeling of Stress : Very much  Social Connections: Socially Isolated (05/09/2023)   Social Connection and Isolation Panel [NHANES]    Frequency of Communication with Friends and Family: Once a week    Frequency of Social Gatherings with Friends and Family: More than three times a week    Attends Religious Services: Never    Database administrator or Organizations: No    Attends Engineer, structural: Not on file    Marital Status: Never married  Intimate Partner Violence: Not At Risk (04/06/2023)   Humiliation,  Afraid, Rape, and Kick questionnaire    Fear of Current or Ex-Partner: No    Emotionally Abused: No    Physically Abused: No    Sexually Abused: No    Family History  Problem Relation Age of Onset   Breast cancer Mother    Seizures Mother    Diabetes type I Father    CAD Father    Lung cancer Maternal Grandfather    CAD Paternal Grandmother    CAD Paternal Grandfather    Ovarian cancer Neg Hx     ROS: Otherwise negative unless mentioned in HPI  Physical Examination  Vitals:   10/10/23 0616 10/10/23 0630  BP:  (!) 114/55  Pulse:  98  Resp:  18  Temp: 98.5 F (36.9 C)   SpO2:  100%   Body mass index is 20.41 kg/m.  General:  WDWN in NAD Gait: Not observed HENT: WNL, normocephalic Pulmonary: normal non-labored breathing, without Rales, rhonchi,  wheezing Cardiac: regular, without  Murmurs, rubs or gallops; without carotid bruits Abdomen: Positive bowel sounds throughout, soft, NT/ND, no masses Skin: without rashes Vascular Exam/Pulses: Palpable pulses throughout, all extremities warm to touch. Extremities: Without ischemic changes, without Gangrene , without cellulitis; without open wounds;  Musculoskeletal: no muscle wasting or atrophy  Neurologic: A&O X 3;  No focal weakness or paresthesias are detected; speech is fluent/normal Psychiatric:  The pt has Normal affect. Lymph:  Unremarkable  CBC    Component Value Date/Time   WBC 9.5 10/10/2023 0353   RBC 2.29 (L) 10/10/2023 0353   HGB 6.9 (L) 10/10/2023 0353   HGB 7.1 (L) 05/10/2023 1405   HCT 22.1 (L) 10/10/2023 0353   HCT 22.6 (L) 05/10/2023 1405   PLT 325 10/10/2023 0353   PLT 127 (L) 05/10/2023 1405   MCV 96.5 10/10/2023 0353   MCV 93 05/10/2023 1405   MCV 84 03/01/2013 1844   MCH 30.1 10/10/2023 0353   MCHC 31.2 10/10/2023 0353   RDW 12.4 10/10/2023 0353   RDW 13.8 05/10/2023 1405   RDW 13.0 03/01/2013 1844   LYMPHSABS 2.0 09/29/2023 1042   LYMPHSABS 1.2 05/10/2023 1405   MONOABS 0.8 09/29/2023  1042   EOSABS 0.2 09/29/2023 1042   EOSABS 0.2 05/10/2023 1405   BASOSABS 0.1 09/29/2023 1042   BASOSABS 0.0 05/10/2023 1405  BMET    Component Value Date/Time   NA 138 10/10/2023 0352   NA 129 (L) 05/10/2023 1405   NA 133 (L) 03/01/2013 1844   K 4.5 10/10/2023 0352   K 4.1 03/01/2013 1844   CL 110 10/10/2023 0352   CL 98 03/01/2013 1844   CO2 18 (L) 10/10/2023 0352   CO2 30 03/01/2013 1844   GLUCOSE 159 (H) 10/10/2023 0352   GLUCOSE 91 01/10/2021 0610   BUN 37 (H) 10/10/2023 0352   BUN 24 (H) 05/10/2023 1405   BUN 13 03/01/2013 1844   CREATININE 2.02 (H) 10/10/2023 0352   CREATININE 0.76 03/01/2013 1844   CALCIUM  8.5 (L) 10/10/2023 0352   CALCIUM  9.1 03/01/2013 1844   GFRNONAA 33 (L) 10/10/2023 0352   GFRNONAA >60 03/01/2013 1844   GFRAA >60 05/30/2019 0853   GFRAA >60 03/01/2013 1844    COAGS: Lab Results  Component Value Date   INR 1.4 (H) 06/09/2022   INR 2.2 (H) 02/27/2022   INR 1.9 (H) 06/15/2021     Non-Invasive Vascular Imaging:   None Ordered  Statin:  No. Beta Blocker:  No. Aspirin :  No. ACEI:  No. ARB:  No. CCB use:  No Other antiplatelets/anticoagulants:  No.    ASSESSMENT/PLAN: This is a 33 y.o. female who presents to [facility] emergency department in DKA with a hemoglobin of 6.9 treatment of her DKA as well as blood transfusions.  Vascular surgery was consulted for central line placement as she has very difficult peripheral access due to her longstanding  I discussed the procedure at the bedside this morning in the emergency department with the patient in detail.  We discussed the procedure, benefits, risk, and complications.  We discussed why she needs the central line access even though she has 2 peripheral IVs which are not adequate for treatment.  She verbalized understanding and wishes to proceed.  Patient does not need any n.p.o. for the procedure.   -I discussed the case in detail with Dr. Mikki Alexander MD recent plan   Annamaria Barrette [facility] 10/10/2023 9:24 AM

## 2023-10-10 NOTE — Assessment & Plan Note (Deleted)
-   Cortef  discontinued while monitoring her blood glucose levels on IV insulin  drip.

## 2023-10-10 NOTE — Inpatient Diabetes Management (Signed)
 Inpatient Diabetes Program Recommendations  AACE/ADA: New Consensus Statement on Inpatient Glycemic Control (2015)  Target Ranges:  Prepandial:   less than 140 mg/dL      Peak postprandial:   less than 180 mg/dL (1-2 hours)      Critically ill patients:  140 - 180 mg/dL   Lab Results  Component Value Date   GLUCAP 84 10/10/2023   HGBA1C 10.3 (H) 05/10/2023    Review of Glycemic Control  Latest Reference Range & Units 10/10/23 03:25 10/10/23 05:00 10/10/23 06:20 10/10/23 07:22 10/10/23 08:30 10/10/23 09:39  Glucose-Capillary 70 - 99 mg/dL 161 (H) 096 (H) 045 (H) 120 (H) 134 (H) 84  (H): Data is abnormally high Diabetes history: Type 1 DM Outpatient Diabetes medications: Lantus  7 units every day, Novolog  2 units TID Current orders for Inpatient glycemic control: Iv insulin    Inpatient Diabetes Program Recommendations:    Familiar with this patient from previous hospitalizations. Patient is VERY sensitive to insulin .   When ready to transition from IV insulin  consider: -Semglee  3 units two hours prior to discontinuation of IV insulin  then every day to follow -Novolog  0-4 units Q4H per custom scale to start administration of insulin  at 200 mg/dL  Thanks, Marjo Sievert, MSN, RNC-OB Diabetes Coordinator [phone] (8a-5p)

## 2023-10-10 NOTE — H&P (Signed)
 [place]   PATIENT NAME: Ashley Camacho    MR#:  [id number]  DATE OF BIRTH:  [birth year]  DATE OF ADMISSION:  10/09/2023  PRIMARY CARE PHYSICIAN: Ostwalt, [name], PA-C   Patient is coming from: Home  REQUESTING/REFERRING PHYSICIAN: Marylynn Soho, MD  CHIEF COMPLAINT:   Chief Complaint  Patient presents with   Emesis    HISTORY OF PRESENT ILLNESS:  Ashley Camacho is a 33 y.o. Caucasian female with medical history significant for type 1 diabetes mellitus, diabetic gastroparesis, essential hypertension, migraine, adrenal insufficiency and hypothyroidism, who presented to the emergency room with acute onset of intractable nausea and vomiting over the last couple of days without bilious vomitus or hematemesis or diarrhea.  She denies any abdominal pain or melena or bright red bleeding per rectum.  No fever or chills.  No dysuria oliguria or hematuria or flank pain.  She has been having polyuria and polydipsia with elevated blood glucose levels.  She did not take insulin  during the day.  ED Course: When the patient came to the ER, heart rate was 109 with otherwise normal vital signs.  Labs revealed a BUN of 44 and creatinine 2.35 comparable to previous levels.  Blood glucose was 306 with anion gap of 16 and CO2 15 with potassium of 5.3.  ABG showed pH 7.19 and PCO240 with HCO3 of 15.3.  CBC showed hemoglobin 8.9 hematocrit 29.1 with platelets of 406.  Beta hydroxybutyrate was 5.2 EKG as reviewed by me : None Imaging: None.  The patient was placed on IV insulin  drip per Endo tool and was given 10 mg of IV Reglan , 4 mg of IV Zofran  and 1 L bolus of IV normal saline as well as a bolus of IV lactated Ringer .  She will be admitted to stepdown unit observation bed for further evaluation and management. PAST MEDICAL HISTORY:   Past Medical History:  Diagnosis Date   Acute metabolic encephalopathy 06/10/2022   Adrenal insufficiency (HCC)    Anemia    CHF (congestive heart failure)  (HCC)    Gastroparesis    Hypertension    Hypotension    Intractable nausea and vomiting 11/13/2022   Migraine    Thyroid  disease    Type 1 diabetes (HCC)     PAST SURGICAL HISTORY:   Past Surgical History:  Procedure Laterality Date   BIOPSY  01/14/2021   Procedure: BIOPSY;  Surgeon: Lindle Rhea, MD;  Location: [facility] ENDOSCOPY;  Service: Gastroenterology;;   CESAREAN SECTION     x2   CESAREAN SECTION WITH BILATERAL TUBAL LIGATION N/A 02/17/2021   Procedure: CESAREAN SECTION WITH BILATERAL TUBAL LIGATION;  Surgeon: Malka Sea, DO;  Location: [facility] LD ORS;  Service: Obstetrics;  Laterality: N/A;   ESOPHAGOGASTRODUODENOSCOPY (EGD) WITH PROPOFOL  N/A 01/14/2021   Procedure: ESOPHAGOGASTRODUODENOSCOPY (EGD) WITH PROPOFOL ;  Surgeon: Lindle Rhea, MD;  Location: [facility] ENDOSCOPY;  Service: Gastroenterology;  Laterality: N/A;   IR BONE MARROW BIOPSY & ASPIRATION  12/16/2022   IR REPLC DUODEN/JEJUNO TUBE PERCUT W/FLUORO  11/22/2022   JEJUNOSTOMY N/A 06/13/2022   Procedure: JEJUNOSTOMY;  Surgeon: Alben Alma, MD;  Location: [facility] ORS;  Service: General;  Laterality: N/A;   MOUTH SURGERY      SOCIAL HISTORY:   Social History   Tobacco Use   Smoking status: Never    Passive exposure: Never   Smokeless tobacco: Never  Substance Use Topics   Alcohol use: No    FAMILY HISTORY:   Family History  Problem Relation Age of Onset   Breast cancer Mother    Seizures Mother    Diabetes type I Father    CAD Father    Lung cancer Maternal Grandfather    CAD Paternal Grandmother    CAD Paternal Grandfather    Ovarian cancer Neg Hx     DRUG ALLERGIES:  No Known Allergies  REVIEW OF SYSTEMS:   ROS As per history of present illness. All pertinent systems were reviewed above. Constitutional, HEENT, cardiovascular, respiratory, GI, GU, musculoskeletal, neuro, psychiatric, endocrine, integumentary and hematologic systems were reviewed and are otherwise negative/unremarkable  except for positive findings mentioned above in the HPI.   MEDICATIONS AT HOME:   Prior to Admission medications   Medication Sig Start Date End Date Taking? Authorizing Provider  acetaminophen  (TYLENOL ) 500 MG tablet Take 2 tablets (1,000 mg total) by mouth every 6 (six) hours as needed for headache, fever or moderate pain. 08/05/22  Yes Ezzard Holms, MD  cefdinir  (OMNICEF ) 300 MG capsule Take 1 capsule (300 mg total) by mouth 2 (two) times daily for 10 days. 09/29/23 10/10/23 Yes Kent Pear, NP  divalproex  (DEPAKOTE ) 250 MG DR tablet Take 250 mg by mouth daily. 09/07/23 12/06/23 Yes [provider]  hydrocortisone  (CORTEF ) 10 MG tablet Take 2 tablets (20 mg total) by mouth daily with breakfast AND 1 tablet (10 mg total) daily with supper. 05/27/23  Yes Sreenath, Sudheer B, MD  insulin  aspart (NOVOLOG  FLEXPEN) 100 UNIT/ML FlexPen Inject 2 units into the skin 3 (three) times daily with meals. In addition, inject 0-9 Units into the skin 3 (three) times daily with meals based on your blood sugar. Also inject 0-5 units into the skin every night at bedtime based on your blood sugar. See att Patient taking differently: Inject into the skin 3-4 times daily with meals and as needed. In addition, inject 0-9 Units into the skin 3 (three) times daily with meals based on your blood sugar. Also inject 0-5 units into the skin every night at bedtime based on your blood sugar. See att 06/01/23  Yes Ostwalt, [name], PA-C  insulin  glargine (LANTUS ) 100 UNIT/ML Solostar Pen Inject 6-7 Units into the skin daily. Patient taking differently: Inject 10 Units into the skin daily. 05/10/23  Yes Ostwalt, [name], PA-C  levothyroxine  (SYNTHROID ) 75 MCG tablet TAKE 1 TABLET (75 MCG TOTAL) BY MOUTH DAILY AT 6 (SIX) AM. 06/20/23  Yes Ostwalt, [name], PA-C  loperamide  (IMODIUM ) 2 MG capsule Take 2 capsules (4 mg total) by mouth as needed for diarrhea or loose stools. 11/15/22  Yes Ree Candy, MD  midodrine  (PROAMATINE ) 2.5  MG tablet Take 1 tablet (2.5 mg total) by mouth 3 (three) times daily with meals as needed (systolic press less than 100). 05/26/23  Yes Wittenborn, Bernardo Bridgeman, NP  ondansetron  (ZOFRAN -ODT) 4 MG disintegrating tablet Take 1 tablet (4 mg total) by mouth every 8 (eight) hours as needed for nausea or vomiting. 09/29/23  Yes Kent Pear, NP  promethazine  (PHENERGAN ) 25 MG tablet Take 0.5 tablets (12.5 mg total) by mouth every 8 (eight) hours as needed for nausea or vomiting. 04/10/23  Yes Aisha Hove, MD  Vitamin D , Ergocalciferol , (DRISDOL ) 1.25 MG (50000 UNIT) CAPS capsule Take 50,000 Units by mouth once a week. (Sunday) 08/08/23  Yes [provider]  Blood Pressure Monitoring (BLOOD PRESSURE CUFF) MISC Use as directed 05/26/23   Morey Ar, NP  Continuous Glucose Sensor (DEXCOM G7 SENSOR) MISC SMARTSIG:1 Each Every 10 Days    [provider]  Nutritional Supplements (FEEDING SUPPLEMENT, GLUCERNA 1.5 CAL,) LIQD Place 640 mLs into feeding tube at bedtime. Patient not taking: Reported on 08/25/2023 11/15/22   Ree Candy, MD      VITAL SIGNS:  Blood pressure (!) 114/55, pulse 98, temperature 98.5 F (36.9 C), temperature source Oral, resp. rate 18, height 5\' 1"  (1.549 m), weight 49 kg, last menstrual period 09/26/2023, SpO2 100%.  PHYSICAL EXAMINATION:  Physical Exam  GENERAL:  33 y.o.-year-old patient lying in the bed with no acute distress.  EYES: Pupils equal, round, reactive to light and accommodation. No scleral icterus. Extraocular muscles intact.  HEENT: Head atraumatic, normocephalic. Oropharynx with slightly dry mucous membrane and tongue and nasopharynx clear.  NECK:  Supple, no jugular venous distention. No thyroid  enlargement, no tenderness.  LUNGS: Normal breath sounds bilaterally, no wheezing, rales,rhonchi or crepitation. No use of accessory muscles of respiration.  CARDIOVASCULAR: Regular rate and rhythm, S1, S2 normal. No murmurs, rubs, or gallops.   ABDOMEN: Soft, nondistended, nontender. Bowel sounds present. No organomegaly or mass.  EXTREMITIES: No pedal edema, cyanosis, or clubbing.  NEUROLOGIC: Cranial nerves II through XII are intact. Muscle strength 5/5 in all extremities. Sensation intact. Gait not checked.  PSYCHIATRIC: The patient is alert and oriented x 3.  Normal affect and good eye contact. SKIN: No obvious rash, lesion, or ulcer.   LABORATORY PANEL:   CBC Recent Labs  Lab 10/10/23 0353  WBC 9.5  HGB 6.9*  HCT 22.1*  PLT 325   ------------------------------------------------------------------------------------------------------------------  Chemistries  Recent Labs  Lab 10/09/23 2035 10/09/23 2346 10/10/23 0352  NA 139   < > 138  K 5.3*   < > 4.5  CL 108   < > 110  CO2 15*   < > 18*  GLUCOSE 306*   < > 159*  BUN 41*   < > 37*  CREATININE 2.36*   < > 2.02*  CALCIUM  9.3   < > 8.5*  AST 14*  --   --   ALT 13  --   --   ALKPHOS 92  --   --   BILITOT 1.2  --   --    < > = values in this interval not displayed.   ------------------------------------------------------------------------------------------------------------------  Cardiac Enzymes No results for input(s): "TROPONINI" in the last 168 hours. ------------------------------------------------------------------------------------------------------------------  RADIOLOGY:  No results found.    IMPRESSION AND PLAN:  Assessment and Plan: * DKA, type 1 (HCC) - The patient will be admitted to a stepdown bed. - We will continue the on IV insulin  drip per EndoTool DKA protocol. - The patient will be aggressively hydrated with IV normal saline. - Will follow serial BMPs.   Hypothyroidism - We will continue Synthroid .  Adrenal insufficiency (Addison's disease) (HCC) - Cortef  discontinued while monitoring her blood glucose levels on IV insulin  drip.    DVT prophylaxis: Lovenox .  Advanced Care Planning:  Code Status: full code.  Family  Communication:  The plan of care was discussed in details with the patient (and family). I answered all questions. The patient agreed to proceed with the above mentioned plan. Further management will depend upon hospital course. Disposition Plan: Back to previous home environment Consults called: none.  All the records are reviewed and case discussed with ED provider.  Status is: Observation  I certify that at the time of admission, it is my clinical judgment that the patient will require hospital care extending less than 2 midnights.  Dispo: The patient is from: Home              Anticipated d/c is to: Home              Patient currently is not medically stable to d/c.              Difficult to place patient: No  Virgene Griffin M.D on 10/10/2023 at 9:04 AM  [facility]   From 7 PM-7 AM, contact night-coverage [email or web address]  CC: Primary care physician; Ostwalt, [name], PA-C

## 2023-10-10 NOTE — ED Notes (Signed)
Lab at bedside attempting blood draw

## 2023-10-10 NOTE — Progress Notes (Addendum)
 Progress Note   Patient: Ashley Camacho:[id number] DOB: [birth year] DOA: 10/09/2023     0 DOS: the patient was seen and examined on 10/10/2023   Brief hospital course: Ashley Camacho is a 33 y.o. Caucasian female with medical history significant for type 1 diabetes mellitus, diabetic gastroparesis, essential hypertension, migraine, adrenal insufficiency and hypothyroidism, who presented to the emergency room with acute onset of intractable nausea and vomiting over the last couple of days. She was recently treated with antibiotics for ear infection, she started having nausea vomiting, she also had loose stools.  She continued use her insulin . Upon arriving to hospital, Leukos was 306, bicarb of 15, anion gap 16.  She is placed on insulin  drip for DKA.    Principal Problem:   DKA, type 1 (HCC) Active Problems:   Hypothyroidism   Chronic combined systolic and diastolic CHF (congestive heart failure) (HCC)   Adrenal insufficiency (HCC)   CKD stage 3b, GFR 30-44 ml/min (HCC)   Adrenal insufficiency (Addison's disease) (HCC)   Encounter for central line placement   Assessment and Plan: * DKA, type 1 (HCC) Patient had a significant hyperglycemia, elevated anion gap with metabolic acidosis. She is treated with insulin  drip, will continue for now. Continue to monitor closely in the stepdown unit.  Addendum: 1051.  Anion gap closed, transition to subcu insulin , may admit to MedSurg now.  Gastroenteritis. Check C. difficile toxin as patient has been taking antibiotic recently.  Hypothyroidism Adrenal insufficiency (Addison's disease) (HCC) Due to DKA with acute distress, hydrocortisone  will change to stress dose.  Chronic kidney disease stage IIIb. Hyperkalemia. Renal function appears to be stable, potassium has normalized.  Combined systolic and diastolic congestive heart failure. Patient currently is dehydrated, no volume overload.  Continue fluid resuscitation for  DKA.  Acute on chronic anemia. Reactive thrombocytosis. Prior lab showed normal iron  and B12 level about a year ago. Currently patient had some loose stool yesterday, but no black stool or rectal bleeding.  Hemoglobin had dropped down to 6.9, will transfuse 1 unit PRBC after repeating hemoglobin less than 8.  Goal of transfusion is 8 due to congestive heart failure.  Addendum: 1600  Patient had increased a hydroxybutyric acid level after after transition to subcu insulin , will go back on insulin  drip.     Subjective:  Patient seems to be doing better today, no additional nausea vomiting.  Complaining some loose stools.  But not had a bowel movement today.  Physical Exam: Vitals:   10/10/23 0130 10/10/23 0330 10/10/23 0616 10/10/23 0630  BP: 122/84 134/70  (!) 114/55  Pulse: (!) 114 (!) 109  98  Resp: 18 18  18   Temp: 98.4 F (36.9 C)  98.5 F (36.9 C)   TempSrc:   Oral   SpO2: 100% 100%  100%  Weight:      Height:       General exam: Appears calm and comfortable  Respiratory system: Mild tachypnea. Respiratory effort normal. Cardiovascular system: S1 & S2 heard, RRR. No JVD, murmurs, rubs, gallops or clicks. No pedal edema. Gastrointestinal system: Abdomen is nondistended, soft and nontender. No organomegaly or masses felt. Normal bowel sounds heard. Central nervous system: Alert and oriented x3. No focal neurological deficits. Extremities: Symmetric 5 x 5 power. Skin: No rashes, lesions or ulcers Psychiatry: Judgement and insight appear normal. Mood & affect appropriate.    Data Reviewed:  Reviewed all lab results.  Family Communication: None  Disposition: Status is: Inpatient Remains inpatient appropriate because:  CRITICAL CARE Performed by: Donaciano Frizzle   Total critical care time: 50 minutes  Critical care time was exclusive of separately billable procedures and treating other patients.  Critical care was necessary to treat or prevent imminent or  life-threatening deterioration.  Critical care was time spent personally by me on the following activities: development of treatment plan with patient and/or surrogate as well as nursing, discussions with consultants, evaluation of patient's response to treatment, examination of patient, obtaining history from patient or surrogate, ordering and performing treatments and interventions, ordering and review of laboratory studies, ordering and review of radiographic studies, pulse oximetry and re-evaluation of patient's condition.       Author: Donaciano Frizzle, MD 10/10/2023 9:49 AM  For on call review [email or web address].

## 2023-10-10 NOTE — Assessment & Plan Note (Addendum)
 Patient was on insulin  drip and switched over to subcutaneous insulin  10 units daily with short acting insulin  2 units plus sliding scale.  Patient states she has her insulins at home.

## 2023-10-10 NOTE — Progress Notes (Signed)
 PHARMACIST - PHYSICIAN COMMUNICATION  CONCERNING:  Enoxaparin  (Lovenox ) for DVT Prophylaxis    RECOMMENDATION: Patient was prescribed enoxaprin 40mg  q24 hours for VTE prophylaxis.   Filed Weights   10/09/23 2020  Weight: 49 kg (108 lb)    Body mass index is 20.41 kg/m.  Estimated Creatinine Clearance: 25.9 mL/min (A) (by C-G formula based on SCr of 2.35 mg/dL (H)).  Patient is candidate for enoxaparin  30mg  every 24 hours based on CrCl <75ml/min or Weight <45kg  DESCRIPTION: Pharmacy has adjusted enoxaparin  dose per [facility] policy.  Patient is now receiving enoxaparin  30 mg every 24 hours   Coretta Dexter, PharmD, [facility] 10/10/2023 2:59 AM

## 2023-10-10 NOTE — Op Note (Signed)
 [facility]   PROCEDURE NOTE  PROCEDURE: 1. Right femoral venous triple lumen central venous catheter placement 2. Right femoral vein cannulation under ultrasound guidance  PRE-OPERATIVE DIAGNOSIS: DKA  POST-OPERATIVE DIAGNOSIS: same as above  SURGEON: Mikki Alexander, MD  ANESTHESIA:  None  ESTIMATED BLOOD LOSS: minimal  FINDING(S): none  SPECIMEN(S):  none  INDICATIONS:   Ashley Camacho is a 33 y.o. female who presents with need for venous access.  The patient presents for central venous catheter placement.  The patient is aware the risks of central venous catheter placement include but are not limited to: bleeding, infection, central venous injury, pneumothorax, possible venous stenosis, possible malpositioning in the venous system, and possible infections related to long-term catheter presence. The patient was aware of these risks and agreed to proceed.  DESCRIPTION: After written informed consent was obtained from the patient and/or family, the patient was placed supine in the hospital bed.  The patient was prepped with chloraprep and draped in the standard fashion for a right femoral central venous catheter placement.  A femoral line was placed due to her chronic kidney disease and likely need for a PermCath in the near future.  I anesthesized the cannulation site with 1% lidocaine  then under ultrasound guidance, the right femoral vein was cannulated with the 18 gauge needle and a permanent image was recorded.  A J wire was then placed.  After a skin nick and dilatation, the triple lumen central venous catheter was placed over the wire and the wire was removed.  Each port was aspirated and flushed with sterile normal saline.  The catheter was secured in placed with three interrupted stitches of 3-0 Silk tied to the catheter.  The catheter was dressed with sterile dressing.   COMPLICATIONS: none apparent  CONDITION: stable  Mikki Alexander 10/10/2023, 9:58  AM     This note was created with Dragon Medical transcription system. Any errors in dictation are purely unintentional.

## 2023-10-10 NOTE — Assessment & Plan Note (Addendum)
 Continue Synthroid

## 2023-10-10 NOTE — ED Notes (Signed)
 Pharmacy message about missing dose of SEMGLEE .

## 2023-10-10 NOTE — Hospital Course (Addendum)
 33 y.o. Caucasian female with medical history significant for type 1 diabetes mellitus, diabetic gastroparesis, essential hypertension, migraine, adrenal insufficiency and hypothyroidism, who presented to the emergency room with acute onset of intractable nausea and vomiting over the last couple of days without bilious vomitus or hematemesis or diarrhea.  She denies any abdominal pain or melena or bright red bleeding per rectum.  No fever or chills.  No dysuria oliguria or hematuria or flank pain.  She has been having polyuria and polydipsia with elevated blood glucose levels.  She did not take insulin  during the day.   ED Course: When the patient came to the ER, heart rate was 109 with otherwise normal vital signs.  Labs revealed a BUN of 44 and creatinine 2.35 comparable to previous levels.  Blood glucose was 306 with anion gap of 16 and CO2 15 with potassium of 5.3.  ABG showed pH 7.19 and PCO240 with HCO3 of 15.3.  CBC showed hemoglobin 8.9 hematocrit 29.1 with platelets of 406.  Beta hydroxybutyrate was 5.2 EKG as reviewed by me : None Imaging: None.   The patient was placed on IV insulin  drip per Endo tool and was given 10 mg of IV Reglan , 4 mg of IV Zofran  and 1 L bolus of IV normal saline as well as a bolus of IV lactated Ringer .  She will be admitted to stepdown unit observation bed for further evaluation and management.  Patient changed to subcutaneous insulin  on 5/20.  Patient feeling better and tolerating diet.  Completed antibiotic course here.  Stable for discharge home.

## 2023-10-10 NOTE — ED Notes (Signed)
 IV team RN at bedside and unable to get access; Dr. Jeane Miguel made aware. Will call lab to do straight stick for blood draw.

## 2023-10-11 DIAGNOSIS — E271 Primary adrenocortical insufficiency: Secondary | ICD-10-CM

## 2023-10-11 DIAGNOSIS — I5042 Chronic combined systolic (congestive) and diastolic (congestive) heart failure: Secondary | ICD-10-CM | POA: Diagnosis not present

## 2023-10-11 DIAGNOSIS — E1010 Type 1 diabetes mellitus with ketoacidosis without coma: Secondary | ICD-10-CM | POA: Diagnosis not present

## 2023-10-11 LAB — BETA-HYDROXYBUTYRIC ACID
Beta-Hydroxybutyric Acid: 0.17 mmol/L (ref 0.05–0.27)
Beta-Hydroxybutyric Acid: 0.31 mmol/L — ABNORMAL HIGH (ref 0.05–0.27)
Beta-Hydroxybutyric Acid: 0.66 mmol/L — ABNORMAL HIGH (ref 0.05–0.27)

## 2023-10-11 LAB — BPAM RBC
Blood Product Expiration Date: 202506182359
ISSUE DATE / TIME: 202505191048
Unit Type and Rh: 6200

## 2023-10-11 LAB — GLUCOSE, CAPILLARY
Glucose-Capillary: 169 mg/dL — ABNORMAL HIGH (ref 70–99)
Glucose-Capillary: 199 mg/dL — ABNORMAL HIGH (ref 70–99)
Glucose-Capillary: 173 mg/dL — ABNORMAL HIGH (ref 70–99)
Glucose-Capillary: 186 mg/dL — ABNORMAL HIGH (ref 70–99)
Glucose-Capillary: 156 mg/dL — ABNORMAL HIGH (ref 70–99)
Glucose-Capillary: 178 mg/dL — ABNORMAL HIGH (ref 70–99)
Glucose-Capillary: 103 mg/dL — ABNORMAL HIGH (ref 70–99)
Glucose-Capillary: 107 mg/dL — ABNORMAL HIGH (ref 70–99)
Glucose-Capillary: 152 mg/dL — ABNORMAL HIGH (ref 70–99)
Glucose-Capillary: 178 mg/dL — ABNORMAL HIGH (ref 70–99)
Glucose-Capillary: 203 mg/dL — ABNORMAL HIGH (ref 70–99)
Glucose-Capillary: 122 mg/dL — ABNORMAL HIGH (ref 70–99)
Glucose-Capillary: 306 mg/dL — ABNORMAL HIGH (ref 70–99)
Glucose-Capillary: 345 mg/dL — ABNORMAL HIGH (ref 70–99)

## 2023-10-11 LAB — BASIC METABOLIC PANEL WITH GFR
Anion gap: 2 — ABNORMAL LOW (ref 5–15)
Anion gap: 5 (ref 5–15)
BUN: 21 mg/dL — ABNORMAL HIGH (ref 6–20)
BUN: 22 mg/dL — ABNORMAL HIGH (ref 6–20)
CO2: 20 mmol/L — ABNORMAL LOW (ref 22–32)
CO2: 24 mmol/L (ref 22–32)
Calcium: 7.9 mg/dL — ABNORMAL LOW (ref 8.9–10.3)
Calcium: 8.2 mg/dL — ABNORMAL LOW (ref 8.9–10.3)
Chloride: 112 mmol/L — ABNORMAL HIGH (ref 98–111)
Chloride: 112 mmol/L — ABNORMAL HIGH (ref 98–111)
Creatinine, Ser: 1.39 mg/dL — ABNORMAL HIGH (ref 0.44–1.00)
Creatinine, Ser: 1.48 mg/dL — ABNORMAL HIGH (ref 0.44–1.00)
GFR, Estimated: 48 mL/min — ABNORMAL LOW
GFR, Estimated: 52 mL/min — ABNORMAL LOW (ref >60)
Glucose, Bld: 115 mg/dL — ABNORMAL HIGH (ref 70–99)
Glucose, Bld: 159 mg/dL — ABNORMAL HIGH (ref 70–99)
Potassium: 4.1 mmol/L (ref 3.5–5.1)
Potassium: 4.4 mmol/L (ref 3.5–5.1)
Sodium: 137 mmol/L (ref 135–145)
Sodium: 138 mmol/L (ref 135–145)

## 2023-10-11 LAB — CBC
HCT: 25.6 % — ABNORMAL LOW (ref 36.0–46.0)
Hemoglobin: 8.6 g/dL — ABNORMAL LOW (ref 12.0–15.0)
MCH: 30.2 pg (ref 26.0–34.0)
MCHC: 33.6 g/dL (ref 30.0–36.0)
MCV: 89.8 fL (ref 80.0–100.0)
Platelets: 280 10*3/uL (ref 150–400)
RBC: 2.85 MIL/uL — ABNORMAL LOW (ref 3.87–5.11)
RDW: 13.7 % (ref 11.5–15.5)
WBC: 6.2 10*3/uL (ref 4.0–10.5)
nRBC: 0 % (ref 0.0–0.2)

## 2023-10-11 LAB — COMPREHENSIVE METABOLIC PANEL WITH GFR
ALT: 8 U/L (ref 0–44)
AST: 15 U/L (ref 15–41)
Albumin: 2.5 g/dL — ABNORMAL LOW (ref 3.5–5.0)
Alkaline Phosphatase: 69 U/L (ref 38–126)
Anion gap: 4 — ABNORMAL LOW (ref 5–15)
BUN: 19 mg/dL (ref 6–20)
CO2: 24 mmol/L (ref 22–32)
Calcium: 8.3 mg/dL — ABNORMAL LOW (ref 8.9–10.3)
Chloride: 111 mmol/L (ref 98–111)
Creatinine, Ser: 1.27 mg/dL — ABNORMAL HIGH (ref 0.44–1.00)
GFR, Estimated: 58 mL/min — ABNORMAL LOW (ref >60)
Glucose, Bld: 92 mg/dL (ref 70–99)
Potassium: 4 mmol/L (ref 3.5–5.1)
Sodium: 139 mmol/L (ref 135–145)
Total Bilirubin: 0.4 mg/dL (ref 0.0–1.2)
Total Protein: 6 g/dL — ABNORMAL LOW (ref 6.5–8.1)

## 2023-10-11 LAB — TYPE AND SCREEN
ABO/RH(D): A POS
Antibody Screen: NEGATIVE
Unit division: 0

## 2023-10-11 LAB — C DIFFICILE QUICK SCREEN W PCR REFLEX
C Diff antigen: NEGATIVE
C Diff interpretation: NOT DETECTED
C Diff toxin: NEGATIVE

## 2023-10-11 LAB — MAGNESIUM: Magnesium: 1.7 mg/dL (ref 1.7–2.4)

## 2023-10-11 LAB — PHOSPHORUS: Phosphorus: 1.7 mg/dL — ABNORMAL LOW (ref 2.5–4.6)

## 2023-10-11 MED ORDER — K PHOS MONO-SOD PHOS DI & MONO 155-852-130 MG PO TABS
500.0000 mg | ORAL_TABLET | ORAL | Status: DC
  Administered 2023-10-11 (×2): 500 mg via ORAL
  Filled 2023-10-11 (×3): qty 2

## 2023-10-11 MED ORDER — HYDROCORTISONE SOD SUC (PF) 100 MG IJ SOLR
50.0000 mg | Freq: Two times a day (BID) | INTRAMUSCULAR | Status: DC
  Administered 2023-10-11: 50 mg via INTRAVENOUS
  Filled 2023-10-11 (×2): qty 1

## 2023-10-11 MED ORDER — INSULIN GLARGINE-YFGN 100 UNIT/ML ~~LOC~~ SOLN
10.0000 [IU] | SUBCUTANEOUS | Status: DC
  Administered 2023-10-11: 10 [IU] via SUBCUTANEOUS
  Filled 2023-10-11 (×2): qty 0.1

## 2023-10-11 MED ORDER — INSULIN ASPART 100 UNIT/ML IJ SOLN
1.0000 [IU] | INTRAMUSCULAR | Status: DC
  Administered 2023-10-11: 3 [IU] via SUBCUTANEOUS
  Administered 2023-10-12: 2 [IU] via SUBCUTANEOUS
  Administered 2023-10-12: 3 [IU] via SUBCUTANEOUS
  Filled 2023-10-11 (×3): qty 1

## 2023-10-11 MED ORDER — ORAL CARE MOUTH RINSE: 15.0000 mL | OROMUCOSAL | Status: DC | PRN

## 2023-10-11 MED ORDER — SODIUM PHOSPHATES 45 MMOLE/15ML IV SOLN
30.0000 mmol | Freq: Once | INTRAVENOUS | Status: AC
  Administered 2023-10-11: 30 mmol via INTRAVENOUS
  Filled 2023-10-11: qty 10

## 2023-10-11 MED ORDER — ALUM & MAG HYDROXIDE-SIMETH 200-200-20 MG/5ML PO SUSP
15.0000 mL | ORAL | Status: DC | PRN
  Administered 2023-10-11 (×2): 15 mL via ORAL
  Filled 2023-10-11 (×2): qty 30

## 2023-10-11 MED ORDER — INSULIN GLARGINE-YFGN 100 UNIT/ML ~~LOC~~ SOLN
6.0000 [IU] | SUBCUTANEOUS | Status: DC
  Filled 2023-10-11: qty 0.06

## 2023-10-11 MED ORDER — INSULIN ASPART 100 UNIT/ML IJ SOLN
1.0000 [IU] | Freq: Three times a day (TID) | INTRAMUSCULAR | Status: DC
  Administered 2023-10-11: 3 [IU] via SUBCUTANEOUS
  Filled 2023-10-11: qty 1

## 2023-10-11 NOTE — Progress Notes (Signed)
 Progress Note   Patient: Ashley Camacho NFA:[id number] DOB: [birth year] DOA: 10/09/2023     1 DOS: the patient was seen and examined on 10/11/2023   Brief hospital course: Ashley Camacho is a 33 y.o. Caucasian female with medical history significant for type 1 diabetes mellitus, diabetic gastroparesis, essential hypertension, migraine, adrenal insufficiency and hypothyroidism, who presented to the emergency room with acute onset of intractable nausea and vomiting over the last couple of days. She was recently treated with antibiotics for ear infection, she started having nausea vomiting, she also had loose stools.  She continued use her insulin . Upon arriving to hospital, Leukos was 306, bicarb of 15, anion gap 16.  She is placed on insulin  drip for DKA. Condition improved, transition to subcu insulin  5/20.    Principal Problem:   DKA, type 1 (HCC) Active Problems:   Hypothyroidism   Chronic combined systolic and diastolic CHF (congestive heart failure) (HCC)   Adrenal insufficiency (HCC)   CKD stage 3b, GFR 30-44 ml/min (HCC)   Adrenal insufficiency (Addison's disease) (HCC)   Encounter for central line placement   Assessment and Plan: * DKA, type 1 (HCC) Patient had a significant hyperglycemia, elevated anion gap with metabolic acidosis. Condition has improved today, transition to subcu insulin .  Confirmed with the patient that she is currently taking 10 units of Lantus , resume home dose plus custom sliding scale insulin , patient has brittle diabetes, frequent hypoglycemia.  Gastroenteritis. Stool C. difficile negative.  May have pancreatic insufficiency, if diarrhea persist, may consider Creon  at discharge.   Hypothyroidism Adrenal insufficiency (Addison's disease) (HCC) Adrenal crisis. Due to DKA with acute distress, hydrocortisone  changed to 100 mg every 8 hours.  Condition had improved, will decrease dose of the 50 mg every 12 hours.  Anticipating change to home dose at  discharge.   Acute kidney injury on chronic kidney disease stage IIIa. Hyperkalemia. Hypophosphatemia. Patient renal function has much improved, creatinine dropped down to 1.27, condition is more consistent with acute kidney injury on chronic kidney disease stage IIIa.  Patient probably does not have chronic kidney disease stage IIIb. Sodium level has normalized.  Patient developed hypophosphatemia, will keep sodium phosphate .  Recheck levels tomorrow.    Combined systolic and diastolic congestive heart failure, chronic. Patient is still euvolemic, received IV fluid.   Acute on chronic anemia. Reactive thrombocytosis. Prior lab showed normal iron  and B12 level about a year ago. Currently patient had some loose stool yesterday, but no black stool or rectal bleeding.  Hemoglobin had dropped down to 6.9 5/19, received 1 unit PRBC. Hemoglobin today 8.6.  Middle ear infection. Patient has received 3 days of oral antibiotics, still has some ear pain, started Rocephin  5/19.    Subjective:  Patient feel much better today, has some loose stools.  Physical Exam: Vitals:   10/11/23 0500 10/11/23 0600 10/11/23 0700 10/11/23 0800  BP: (!) 140/81 (!) 153/89 (!) 144/75   Pulse: 79 75 76   Resp: 17 17 15    Temp:    98 F (36.7 C)  TempSrc:    Axillary  SpO2: 100% 100% 100%   Weight:      Height:       General exam: Appears calm and comfortable  Respiratory system: Clear to auscultation. Respiratory effort normal. Cardiovascular system: S1 & S2 heard, RRR. No JVD, murmurs, rubs, gallops or clicks. No pedal edema. Gastrointestinal system: Abdomen is nondistended, soft and nontender. No organomegaly or masses felt. Normal bowel sounds heard. Central nervous system:  Alert and oriented. No focal neurological deficits. Extremities: Symmetric 5 x 5 power. Skin: No rashes, lesions or ulcers Psychiatry: Judgement and insight appear normal. Mood & affect appropriate.    Data Reviewed:  Lab  results reviewed.  Family Communication: None  Disposition: Status is: Inpatient Remains inpatient appropriate because: Severity of disease, IV treatment.     Time spent: 50 minutes  Author: Donaciano Frizzle, MD 10/11/2023 1:08 PM  For on call review [email or web address].

## 2023-10-11 NOTE — Inpatient Diabetes Management (Signed)
 Inpatient Diabetes Program Recommendations  AACE/ADA: New Consensus Statement on Inpatient Glycemic Control (2015)  Target Ranges:  Prepandial:   less than 140 mg/dL      Peak postprandial:   less than 180 mg/dL (1-2 hours)      Critically ill patients:  140 - 180 mg/dL   Lab Results  Component Value Date   GLUCAP 107 (H) 10/11/2023   HGBA1C 8.0 (H) 10/09/2023    Review of Glycemic Control  Latest Reference Range & Units 10/11/23 02:04 10/11/23 03:08 10/11/23 04:18 10/11/23 05:08 10/11/23 06:26 10/11/23 07:40 10/11/23 08:52  Glucose-Capillary 70 - 99 mg/dL 119 (H) 147 (H) 829 (H) 156 (H) 178 (H) 103 (H) 107 (H)   Diabetes history: DM 1- very sensitive to insulin  Outpatient Diabetes medications:  Lantus  6-7 units daily, Novolog  2 units tid with meals plus correction Current orders for Inpatient glycemic control:  IV insulin -   Inpatient Diabetes Program Recommendations:   When ready to transition from IV insulin  consider: -Semglee  5 units two hours prior to discontinuation of IV insulin  then every day to follow -Novolog  0-4 units Q4H per custom scale to start administration of insulin  at 200 mg/dL (201-250 mg/dL- 1 unit, 251-300 mg-2 units, 301-350 mg/dL-3 units, 351-400 mg/dL-4 units).   Thanks,  Josefa Ni, RN, BC-ADM Inpatient Diabetes Coordinator Pager [phone]  (8a-5p)

## 2023-10-11 NOTE — Plan of Care (Signed)
  Problem: Fluid Volume: Goal: Ability to maintain a balanced intake and output will improve Outcome: Progressing   Problem: Skin Integrity: Goal: Risk for impaired skin integrity will decrease Outcome: Progressing   Problem: Tissue Perfusion: Goal: Adequacy of tissue perfusion will improve Outcome: Progressing   Problem: Respiratory: Goal: Will regain and/or maintain adequate ventilation Outcome: Progressing   Problem: Urinary Elimination: Goal: Ability to achieve and maintain adequate renal perfusion and functioning will improve Outcome: Progressing

## 2023-10-12 DIAGNOSIS — K529 Noninfective gastroenteritis and colitis, unspecified: Secondary | ICD-10-CM | POA: Diagnosis not present

## 2023-10-12 DIAGNOSIS — E2740 Unspecified adrenocortical insufficiency: Secondary | ICD-10-CM | POA: Diagnosis not present

## 2023-10-12 DIAGNOSIS — E038 Other specified hypothyroidism: Secondary | ICD-10-CM

## 2023-10-12 DIAGNOSIS — N1832 Chronic kidney disease, stage 3b: Secondary | ICD-10-CM | POA: Diagnosis not present

## 2023-10-12 DIAGNOSIS — D638 Anemia in other chronic diseases classified elsewhere: Secondary | ICD-10-CM

## 2023-10-12 DIAGNOSIS — E1010 Type 1 diabetes mellitus with ketoacidosis without coma: Secondary | ICD-10-CM | POA: Diagnosis not present

## 2023-10-12 DIAGNOSIS — I5042 Chronic combined systolic (congestive) and diastolic (congestive) heart failure: Secondary | ICD-10-CM | POA: Diagnosis not present

## 2023-10-12 LAB — GLUCOSE, CAPILLARY
Glucose-Capillary: 190 mg/dL — ABNORMAL HIGH (ref 70–99)
Glucose-Capillary: 338 mg/dL — ABNORMAL HIGH (ref 70–99)
Glucose-Capillary: 282 mg/dL — ABNORMAL HIGH (ref 70–99)
Glucose-Capillary: 299 mg/dL — ABNORMAL HIGH (ref 70–99)

## 2023-10-12 LAB — BASIC METABOLIC PANEL WITH GFR
Anion gap: 8 (ref 5–15)
BUN: 21 mg/dL — ABNORMAL HIGH (ref 6–20)
CO2: 23 mmol/L (ref 22–32)
Calcium: 7.4 mg/dL — ABNORMAL LOW (ref 8.9–10.3)
Chloride: 111 mmol/L (ref 98–111)
Creatinine, Ser: 1.38 mg/dL — ABNORMAL HIGH (ref 0.44–1.00)
GFR, Estimated: 52 mL/min — ABNORMAL LOW (ref >60)
Glucose, Bld: 292 mg/dL — ABNORMAL HIGH (ref 70–99)
Potassium: 3.6 mmol/L (ref 3.5–5.1)
Sodium: 142 mmol/L (ref 135–145)

## 2023-10-12 LAB — CBC
HCT: 25.6 % — ABNORMAL LOW (ref 36.0–46.0)
Hemoglobin: 8.5 g/dL — ABNORMAL LOW (ref 12.0–15.0)
MCH: 30.5 pg (ref 26.0–34.0)
MCHC: 33.2 g/dL (ref 30.0–36.0)
MCV: 91.8 fL (ref 80.0–100.0)
Platelets: 266 10*3/uL (ref 150–400)
RBC: 2.79 MIL/uL — ABNORMAL LOW (ref 3.87–5.11)
RDW: 13.7 % (ref 11.5–15.5)
WBC: 10.6 10*3/uL — ABNORMAL HIGH (ref 4.0–10.5)
nRBC: 0 % (ref 0.0–0.2)

## 2023-10-12 LAB — MAGNESIUM: Magnesium: 1.7 mg/dL (ref 1.7–2.4)

## 2023-10-12 LAB — PHOSPHORUS: Phosphorus: 5.8 mg/dL — ABNORMAL HIGH (ref 2.5–4.6)

## 2023-10-12 MED ORDER — INSULIN ASPART 100 UNIT/ML FLEXPEN: 2.0000 [IU] | PEN_INJECTOR | Freq: Three times a day (TID) | SUBCUTANEOUS | Status: DC

## 2023-10-12 MED ORDER — HYDROCORTISONE 10 MG PO TABS
10.0000 mg | ORAL_TABLET | Freq: Every day | ORAL | Status: DC
  Filled 2023-10-12: qty 1

## 2023-10-12 MED ORDER — HYDROCORTISONE 10 MG PO TABS
20.0000 mg | ORAL_TABLET | Freq: Every day | ORAL | Status: DC
  Administered 2023-10-12: 20 mg via ORAL
  Filled 2023-10-12: qty 2

## 2023-10-12 MED ORDER — INSULIN ASPART 100 UNIT/ML FLEXPEN: 2.0000 [IU] | PEN_INJECTOR | Freq: Three times a day (TID) | SUBCUTANEOUS | Status: AC

## 2023-10-12 MED ORDER — INSULIN GLARGINE-YFGN 100 UNIT/ML ~~LOC~~ SOLN
10.0000 [IU] | SUBCUTANEOUS | Status: DC
  Administered 2023-10-12: 10 [IU] via SUBCUTANEOUS
  Filled 2023-10-12: qty 0.1

## 2023-10-12 MED ORDER — INSULIN GLARGINE-YFGN 100 UNIT/ML ~~LOC~~ SOLN
8.0000 [IU] | SUBCUTANEOUS | Status: DC
  Filled 2023-10-12: qty 0.08

## 2023-10-12 MED ORDER — INSULIN GLARGINE 100 UNIT/ML SOLOSTAR PEN: 10.0000 [IU] | PEN_INJECTOR | Freq: Every day | SUBCUTANEOUS | Status: DC

## 2023-10-12 NOTE — Inpatient Diabetes Management (Signed)
 Inpatient Diabetes Program Recommendations  AACE/ADA: New Consensus Statement on Inpatient Glycemic Control (2015)  Target Ranges:  Prepandial:   less than 140 mg/dL      Peak postprandial:   less than 180 mg/dL (1-2 hours)      Critically ill patients:  140 - 180 mg/dL   Lab Results  Component Value Date   GLUCAP 299 (H) 10/12/2023   HGBA1C 8.0 (H) 10/09/2023    Review of Glycemic Control  Latest Reference Range & Units 10/11/23 16:43 10/11/23 21:07 10/12/23 02:27 10/12/23 06:36 10/12/23 08:21 10/12/23 12:04  Glucose-Capillary 70 - 99 mg/dL 696 (H) 295 (H) 284 (H) 338 (H) 282 (H) 299 (H)  Diabetes history: DM  Outpatient Diabetes medications:  Novolog  2 units tid with meals plus correction Lantus  10 units daily Dexcom G7 Current orders for Inpatient glycemic control:  Novolog  1-4 units q 4 hours Semglee  10 units daily  Inpatient Diabetes Program Recommendations:    Note patient to be discharged today.  Called and spoke with patient.  She states that she has insulin  a plenty of CGM sensors at home.  We briefly discussed her A1C and importance of caring for herself and DM.  No needs identified at this time.   Thanks,  Josefa Ni, RN, BC-ADM Inpatient Diabetes Coordinator Pager [phone]  (8a-5p)

## 2023-10-12 NOTE — Assessment & Plan Note (Signed)
 Patient euvolemic.  Last EF 55%.

## 2023-10-12 NOTE — Plan of Care (Signed)

## 2023-10-12 NOTE — TOC Transition Note (Signed)
 Transition of Care [facility]) - Discharge Note   Patient Details  Name: [name] MRN: [id number] Date of Birth: Mar 24, 1991  [facility] CM/SW Contact:  Alexandra Ice, RN Phone Number: 10/12/2023, 11:35 AM   Clinical Narrative:     Patient to discharge today, home. No TOC needs identified.  Final next level of care: Home/Self Care Barriers to Discharge: Barriers Resolved   Patient Goals and CMS Choice Patient states their goals for this hospitalization and ongoing recovery are:: get better          Discharge Placement                  Name of family member notified: Family Patient and family notified of of transfer: 10/12/23  Discharge Plan and Services Additional resources added to the After Visit Summary for                    DME Agency: NA       HH Arranged: NA          Social Drivers of Health (SDOH) Interventions SDOH Screenings   Food Insecurity: Food Insecurity Present (10/10/2023)  Housing: Low Risk  (10/10/2023)  Transportation Needs: Unmet Transportation Needs (10/10/2023)  Utilities: Not At Risk (10/10/2023)  Recent Concern: Utilities - At Risk (08/04/2023)   Received from [facility]  Depression [phone]): High Risk (05/10/2023)  Financial Resource Strain: Low Risk  (08/04/2023)   Received from [facility]  Recent Concern: Financial Resource Strain - High Risk (05/09/2023)  Physical Activity: Unknown (05/09/2023)  Social Connections: Socially Isolated (05/09/2023)  Stress: Stress Concern Present (05/09/2023)  Tobacco Use: Low Risk  (10/09/2023)     Readmission Risk Interventions    04/07/2023   11:07 AM 02/17/2023    3:02 PM 11/24/2022   11:04 AM  Readmission Risk Prevention Plan  Transportation Screening Complete Complete Complete  Medication Review Oceanographer) Complete Complete Complete  PCP or Specialist appointment within 3-5 days of discharge Complete Complete  Complete  HRI or Home Care Consult  Not Complete   HRI or Home Care Consult Pt Refusal Comments  N/A   SW Recovery Care/Counseling Consult Complete Complete Complete  Palliative Care Screening Not Applicable Not Applicable Not Applicable  Skilled Nursing Facility Not Applicable Not Applicable Not Applicable

## 2023-10-12 NOTE — Discharge Summary (Signed)
 Physician Discharge Summary   Patient: Ashley Camacho MRN: [id number] DOB: 1991-02-15  Admit date:     10/09/2023  Discharge date: 10/12/23  Discharge Physician: Verla Glaze   PCP: Ostwalt, [name], PA-C   Recommendations at discharge:   Follow-up PCP 5 days  Discharge Diagnoses: Principal Problem:   DKA, type 1 (HCC) Active Problems:   Gastroenteritis   Chronic combined systolic and diastolic CHF (congestive heart failure) (HCC)   Adrenal insufficiency (HCC)   Anemia of chronic disease   Hypothyroidism   CKD stage 3b, GFR 30-44 ml/min (HCC)   Adrenal insufficiency (Addison's disease) (HCC)   Encounter for central line placement    Hospital Course: 33 y.o. Caucasian female with medical history significant for type 1 diabetes mellitus, diabetic gastroparesis, essential hypertension, migraine, adrenal insufficiency and hypothyroidism, who presented to the emergency room with acute onset of intractable nausea and vomiting over the last couple of days without bilious vomitus or hematemesis or diarrhea.  She denies any abdominal pain or melena or bright red bleeding per rectum.  No fever or chills.  No dysuria oliguria or hematuria or flank pain.  She has been having polyuria and polydipsia with elevated blood glucose levels.  She did not take insulin  during the day.   ED Course: When the patient came to the ER, heart rate was 109 with otherwise normal vital signs.  Labs revealed a BUN of 44 and creatinine 2.35 comparable to previous levels.  Blood glucose was 306 with anion gap of 16 and CO2 15 with potassium of 5.3.  ABG showed pH 7.19 and PCO240 with HCO3 of 15.3.  CBC showed hemoglobin 8.9 hematocrit 29.1 with platelets of 406.  Beta hydroxybutyrate was 5.2 EKG as reviewed by me : None Imaging: None.   The patient was placed on IV insulin  drip per Endo tool and was given 10 mg of IV Reglan , 4 mg of IV Zofran  and 1 L bolus of IV normal saline as well as a bolus of IV lactated  Ringer.  She will be admitted to stepdown unit observation bed for further evaluation and management.  Patient changed to subcutaneous insulin  on 5/20.  Patient feeling better and tolerating diet.  Completed antibiotic course here.  Stable for discharge home.   Assessment and Plan: * DKA, type 1 (HCC) Patient was on insulin  drip and switched over to subcutaneous insulin  10 units daily with short acting insulin  2 units plus sliding scale.  Patient states she has her insulins at home.  Gastroenteritis Resolved  Chronic combined systolic and diastolic CHF (congestive heart failure) (HCC) Patient euvolemic.  Last EF 55%.  Adrenal insufficiency (HCC) Patient was given stress dose steroids and placed back on her Cortef  20 mg in the morning and 10 mg in the afternoon.  Anemia of chronic disease Last hemoglobin 8.5.  Patient was given a unit of blood on 5/19.  Hypothyroidism Continue Synthroid   CKD stage 3b, GFR 30-44 ml/min (HCC) Creatinine upon discharge 1.38 with a GFR 52         Consultants: Vascular surgery, critical care Procedures performed: Central line Disposition: Home Diet recommendation:  Cardiac and Carb modified diet DISCHARGE MEDICATION: Allergies as of 10/12/2023   No Known Allergies      Medication List     STOP taking these medications    cefdinir  300 MG capsule Commonly known as: OMNICEF    feeding supplement (GLUCERNA 1.5 CAL) Liqd   loperamide  2 MG capsule Commonly known as: IMODIUM    promethazine  25  MG tablet Commonly known as: PHENERGAN        TAKE these medications    acetaminophen  500 MG tablet Commonly known as: TYLENOL  Take 2 tablets (1,000 mg total) by mouth every 6 (six) hours as needed for headache, fever or moderate pain.   Blood Pressure Cuff Misc Use as directed   Dexcom G7 Sensor Misc SMARTSIG:1 Each Every 10 Days   divalproex  250 MG DR tablet Commonly known as: DEPAKOTE  Take 250 mg by mouth daily.   hydrocortisone   10 MG tablet Commonly known as: CORTEF  Take 2 tablets (20 mg total) by mouth daily with breakfast AND 1 tablet (10 mg total) daily with supper.   insulin  aspart 100 UNIT/ML FlexPen Commonly known as: NOVOLOG  Inject 2 Units into the skin 3 (three) times daily with meals. If eating and Blood Glucose (BG) 80 or higher inject 2 units for meal coverage and add correction dose per scale. If not eating, correction dose only. BG <150= 0 unit; BG 150-200= 1 unit; BG 201-250= 3 unit; BG 251-300= 5 unit; BG 301-350= 7 unit; BG 351-400= 9 unit; BG >400= 11 unit and Call Primary Care. What changed:  how much to take how to take this when to take this additional instructions   insulin  glargine 100 UNIT/ML Solostar Pen Commonly known as: LANTUS  Inject 10 Units into the skin daily.   levothyroxine  75 MCG tablet Commonly known as: SYNTHROID  TAKE 1 TABLET (75 MCG TOTAL) BY MOUTH DAILY AT 6 (SIX) AM.   midodrine  2.5 MG tablet Commonly known as: PROAMATINE  Take 1 tablet (2.5 mg total) by mouth 3 (three) times daily with meals as needed (systolic press less than 100).   ondansetron  4 MG disintegrating tablet Commonly known as: ZOFRAN -ODT Take 1 tablet (4 mg total) by mouth every 8 (eight) hours as needed for nausea or vomiting.   Vitamin D  (Ergocalciferol ) 1.25 MG (50000 UNIT) Caps capsule Commonly known as: DRISDOL  Take 50,000 Units by mouth once a week. (Sunday)        Follow-up Information     Ostwalt, [name], PA-C. Go on 10/21/2023.   Specialty: Physician Assistant Why: Friday, May 30 at 2:00 p.m. - arrive by 1:45 p.m. Contact information: [address] [place] [address] [phone]                Discharge Exam: Ashley Camacho Weights   10/09/23 2020  Weight: 49 kg   Physical Exam HENT:     Head: Normocephalic.  Eyes:     General: Lids are normal.     Conjunctiva/sclera: Conjunctivae normal.  Cardiovascular:     Rate and Rhythm: Normal rate and regular rhythm.      Heart sounds: Normal heart sounds, S1 normal and S2 normal.  Pulmonary:     Breath sounds: No decreased breath sounds, wheezing, rhonchi or rales.  Abdominal:     Palpations: Abdomen is soft.     Tenderness: There is no abdominal tenderness.  Musculoskeletal:     Right lower leg: No swelling.     Left lower leg: No swelling.  Skin:    General: Skin is warm.     Findings: No rash.  Neurological:     Mental Status: She is alert and oriented to person, place, and time.      Condition at discharge: stable  The results of significant diagnostics from this hospitalization (including imaging, microbiology, ancillary and laboratory) are listed below for reference.   Imaging Studies: CARDIAC CATHETERIZATION Result Date: 10/10/2023 See surgical note  for result.  DG Toe 2nd Left Result Date: 09/29/2023 CLINICAL DATA:  Edema, ecchymosis, dorsal wound x 1 week status post trampoline injury. EXAM: LEFT SECOND TOE COMPARISON:  Foot radiographs 11/22/2011. FINDINGS: Possible mild osseous demineralization versus technical factors. No evidence of acute fracture or dislocation. The joint spaces are preserved. No foreign body or other focal soft tissue abnormality identified. IMPRESSION: No evidence of acute fracture or dislocation. Electronically Signed   By: Elmon Hagedorn M.D.   On: 09/29/2023 11:55    Microbiology: Results for orders placed or performed during the hospital encounter of 10/09/23  MRSA Next Gen by PCR, Nasal     Status: None   Collection Time: 10/10/23  4:51 PM   Specimen: Nasal Mucosa; Nasal Swab  Result Value Ref Range Status   MRSA by PCR Next Gen NOT DETECTED NOT DETECTED Final    Comment: (NOTE) The GeneXpert MRSA Assay (FDA approved for NASAL specimens only), is one component of a comprehensive MRSA colonization surveillance program. It is not intended to diagnose MRSA infection nor to guide or monitor treatment for MRSA infections. Test performance is not FDA approved  in patients less than 65 years old. Performed at [facility], [address] [place], [place] [address]   C Difficile Quick Screen w PCR reflex     Status: None   Collection Time: 10/11/23 11:08 AM   Specimen: STOOL  Result Value Ref Range Status   C Diff antigen NEGATIVE NEGATIVE Final   C Diff toxin NEGATIVE NEGATIVE Final   C Diff interpretation No C. difficile detected.  Final    Comment: Performed at [facility], [address] [place], [place] [address]    Labs: CBC: Recent Labs  Lab 10/09/23 2035 10/10/23 0353 10/10/23 0922 10/11/23 0740 10/12/23 0525  WBC 8.7 9.5  --  6.2 10.6*  HGB 8.9* 6.9* 6.3* 8.6* 8.5*  HCT 29.1* 22.1*  --  25.6* 25.6*  MCV 98.6 96.5  --  89.8 91.8  PLT 406* 325  --  280 266   Basic Metabolic Panel: Recent Labs  Lab 10/10/23 0922 10/10/23 1608 10/10/23 1955 10/10/23 2340 10/11/23 0316 10/11/23 0740 10/12/23 0525  NA 141   < > 136 138 137 139 142  K 4.1   < > 5.7* 4.4 4.1 4.0 3.6  CL 113*   < > 109 112* 112* 111 111  CO2 19*   < > 19* 24 20* 24 23  GLUCOSE 106*   < > 164* 115* 159* 92 292*  BUN 33*   < > 25* 22* 21* 19 21*  CREATININE 1.76*   < > 1.54* 1.39* 1.48* 1.27* 1.38*  CALCIUM  8.7*   < > 8.2* 8.2* 7.9* 8.3* 7.4*  MG 2.0  --   --   --   --  1.7 1.7  PHOS 3.1  --   --   --   --  1.7* 5.8*   < > = values in this interval not displayed.   Liver Function Tests: Recent Labs  Lab 10/09/23 2035 10/11/23 0740  AST 14* 15  ALT 13 8  ALKPHOS 92 69  BILITOT 1.2 0.4  PROT 8.3* 6.0*  ALBUMIN  3.5 2.5*   CBG: Recent Labs  Lab 10/11/23 2107 10/12/23 0227 10/12/23 0636 10/12/23 0821 10/12/23 1204  GLUCAP 345* 190* 338* 282* 299*    Discharge time spent: greater than 30 minutes.  Signed: Verla Glaze, MD [facility] 10/12/2023

## 2023-10-12 NOTE — Assessment & Plan Note (Addendum)
 Patient was given stress dose steroids and placed back on her Cortef  20 mg in the morning and 10 mg in the afternoon.

## 2023-10-12 NOTE — Assessment & Plan Note (Signed)
 Resolved

## 2023-10-12 NOTE — Assessment & Plan Note (Signed)
 Creatinine upon discharge 1.38 with a GFR 52

## 2023-10-12 NOTE — Assessment & Plan Note (Signed)
 Last hemoglobin 8.5.  Patient was given a unit of blood on 5/19.

## 2023-10-21 ENCOUNTER — Encounter: Payer: Self-pay | Admitting: Physician Assistant

## 2023-10-21 ENCOUNTER — Ambulatory Visit (INDEPENDENT_AMBULATORY_CARE_PROVIDER_SITE_OTHER): Admitting: Physician Assistant

## 2023-10-21 VITALS — BP 133/72 | HR 88 | Resp 16 | Ht 61.0 in | Wt 108.0 lb

## 2023-10-21 DIAGNOSIS — E2740 Unspecified adrenocortical insufficiency: Secondary | ICD-10-CM

## 2023-10-21 DIAGNOSIS — I5020 Unspecified systolic (congestive) heart failure: Secondary | ICD-10-CM | POA: Diagnosis not present

## 2023-10-21 DIAGNOSIS — F339 Major depressive disorder, recurrent, unspecified: Secondary | ICD-10-CM | POA: Diagnosis not present

## 2023-10-21 DIAGNOSIS — E109 Type 1 diabetes mellitus without complications: Secondary | ICD-10-CM

## 2023-10-21 DIAGNOSIS — H9311 Tinnitus, right ear: Secondary | ICD-10-CM | POA: Diagnosis not present

## 2023-10-21 DIAGNOSIS — N1832 Chronic kidney disease, stage 3b: Secondary | ICD-10-CM | POA: Diagnosis not present

## 2023-10-21 DIAGNOSIS — K3184 Gastroparesis: Secondary | ICD-10-CM

## 2023-10-21 DIAGNOSIS — E038 Other specified hypothyroidism: Secondary | ICD-10-CM | POA: Diagnosis not present

## 2023-10-21 NOTE — Progress Notes (Signed)
 Established patient visit  Patient: Ashley Camacho   DOB: [birth year]   33 y.o. Female  MRN: [id number] Visit Date: 10/21/2023  Today's healthcare provider: Blane Bunting, PA-C   Chief Complaint  Patient presents with   Hospitalization Follow-up    Hospital f/u    Subjective     Discussed the use of AI scribe software for clinical note transcription with the patient, who gave verbal consent to proceed.  History of Present Illness Ashley Camacho is a 33 year old female with type 1 diabetes and congestive heart failure who presents with persistent ear and sinus infections.  She experiences persistent ear issues, including a constant 'whooshing heartbeat sound' in her right ear, which began about a month ago with an earache. Despite antibiotic treatment, the symptoms persist. She also has chronic sinus infections.  Her blood sugar levels fluctuate, with fasting levels in the 80s to 90s, but postprandial levels rise significantly. She is on insulin , taking 10 units daily, and uses short-acting insulin  before meals on a sliding scale.  Her heart failure symptoms have improved. She is on Cortef  for adrenal insufficiency, taking 20 mg in the morning and 10 mg in the afternoon.  She experiences fatigue and depression, with some days being worse than others. She is not on medication for depression and has requested a referral to a psychiatrist.  No chest pain, shortness of breath, or rapid heart beating. Normal vision and no significant changes in her blood pressure, which is slightly elevated but normal for her.  Discussed the use of AI scribe software for clinical note transcription with the patient, who gave verbal consent to proceed.   Follow up Hospitalization  Patient was admitted to [facility] on 10/09/23 and discharged on 10/12/23. She was treated for DKA type 1, gastroenteritis, CHF, adrenal insufficiency, anemia of chronic disease, hypothyroidism, CKD st 3b, central line  placement. Treatment for this included insulin  drip, d/c with sq insulin  10 unit daily ad short acting insulin  2 units /sliding scale. Last EF 55%, cortef  20mg  am and 10mg  pm for adrenal insufficiency Received a unit of blood on 5/19 Telephone follow up was not done She reports adequate compliance with treatment. She reports this condition is improved.  ----------------------------------------------------------------------------------------- -     10/21/2023    2:58 PM 05/10/2023   10:25 AM 11/11/2020    4:49 PM  Depression screen PHQ 2/9  Decreased Interest 0 2 0  Down, Depressed, Hopeless 2 3 0  PHQ - 2 Score 2 5 0  Altered sleeping 2 3   Tired, decreased energy 2 3   Change in appetite 0 3   Feeling bad or failure about yourself  2 3   Trouble concentrating 0 1   Moving slowly or fidgety/restless 2 2   Suicidal thoughts 0 3   PHQ-9 Score 10 23   Difficult doing work/chores Somewhat difficult Very difficult       10/21/2023    2:58 PM 05/10/2023   10:27 AM  GAD 7 : Generalized Anxiety Score  Nervous, Anxious, on Edge 2 2  Control/stop worrying 2 2  Worry too much - different things 3 2  Trouble relaxing 1 1  Restless 2 1  Easily annoyed or irritable 1 3  Afraid - awful might happen 0 0  Total GAD 7 Score 11 11  Anxiety Difficulty Somewhat difficult Very difficult    Medications: Outpatient Medications Prior to Visit  Medication Sig   acetaminophen  (TYLENOL ) 500 MG tablet Take 2  tablets (1,000 mg total) by mouth every 6 (six) hours as needed for headache, fever or moderate pain.   Blood Pressure Monitoring (BLOOD PRESSURE CUFF) MISC Use as directed   Continuous Glucose Sensor (DEXCOM G7 SENSOR) MISC SMARTSIG:1 Each Every 10 Days   divalproex  (DEPAKOTE ) 250 MG DR tablet Take 250 mg by mouth daily.   hydrocortisone  (CORTEF ) 10 MG tablet Take 2 tablets (20 mg total) by mouth daily with breakfast AND 1 tablet (10 mg total) daily with supper.   insulin  aspart (NOVOLOG )  100 UNIT/ML FlexPen Inject 2 Units into the skin 3 (three) times daily with meals. If eating and Blood Glucose (BG) 80 or higher inject 2 units for meal coverage and add correction dose per scale. If not eating, correction dose only. BG <150= 0 unit; BG 150-200= 1 unit; BG 201-250= 3 unit; BG 251-300= 5 unit; BG 301-350= 7 unit; BG 351-400= 9 unit; BG >400= 11 unit and Call Primary Care.   insulin  glargine (LANTUS ) 100 UNIT/ML Solostar Pen Inject 10 Units into the skin daily.   levothyroxine  (SYNTHROID ) 75 MCG tablet TAKE 1 TABLET (75 MCG TOTAL) BY MOUTH DAILY AT 6 (SIX) AM.   midodrine  (PROAMATINE ) 2.5 MG tablet Take 1 tablet (2.5 mg total) by mouth 3 (three) times daily with meals as needed (systolic press less than 100).   ondansetron  (ZOFRAN -ODT) 4 MG disintegrating tablet Take 1 tablet (4 mg total) by mouth every 8 (eight) hours as needed for nausea or vomiting.   Vitamin D , Ergocalciferol , (DRISDOL ) 1.25 MG (50000 UNIT) CAPS capsule Take 50,000 Units by mouth once a week. (Sunday)   No facility-administered medications prior to visit.    Review of Systems All negative Except see HPI       Objective    BP 133/72 (BP Location: Right Arm, Patient Position: Sitting, Cuff Size: Normal)   Pulse 88   Resp 16   Ht 5\' 1"  (1.549 m)   Wt 108 lb (49 kg)   LMP 09/26/2023 (Approximate)   SpO2 98%   BMI 20.41 kg/m     Physical Exam Vitals reviewed.  Constitutional:      General: She is not in acute distress.    Appearance: Normal appearance. She is well-developed. She is not diaphoretic.  HENT:     Head: Normocephalic and atraumatic.  Eyes:     General: No scleral icterus.    Conjunctiva/sclera: Conjunctivae normal.  Neck:     Thyroid : No thyromegaly.  Cardiovascular:     Rate and Rhythm: Normal rate and regular rhythm.     Pulses: Normal pulses.     Heart sounds: Normal heart sounds. No murmur heard. Pulmonary:     Effort: Pulmonary effort is normal. No respiratory distress.      Breath sounds: Normal breath sounds. No wheezing, rhonchi or rales.  Musculoskeletal:     Cervical back: Neck supple.     Right lower leg: No edema.     Left lower leg: No edema.  Lymphadenopathy:     Cervical: No cervical adenopathy.  Skin:    General: Skin is warm and dry.     Findings: No rash.  Neurological:     Mental Status: She is alert and oriented to person, place, and time. Mental status is at baseline.  Psychiatric:        Mood and Affect: Mood normal.        Behavior: Behavior normal.      Results for orders placed or performed in visit  on 10/21/23  Basic metabolic panel with GFR  Result Value Ref Range   Glucose 209 (H) 70 - 99 mg/dL   BUN 29 (H) 6 - 20 mg/dL   Creatinine, Ser 6.29 (H) 0.57 - 1.00 mg/dL   eGFR 43 (L) >52 WU/XLK/4.40   BUN/Creatinine Ratio 18 9 - 23   Sodium 143 134 - 144 mmol/L   Potassium 5.3 (H) 3.5 - 5.2 mmol/L   Chloride 106 96 - 106 mmol/L   CO2 18 (L) 20 - 29 mmol/L   Calcium  8.9 8.7 - 10.2 mg/dL  CBC with Differential/Platelet  Result Value Ref Range   WBC 5.6 3.4 - 10.8 x10E3/uL   RBC 3.11 (L) 3.77 - 5.28 x10E6/uL   Hemoglobin 9.3 (L) 11.1 - 15.9 g/dL   Hematocrit 10.2 (L) 72.5 - 46.6 %   MCV 92 79 - 97 fL   MCH 29.9 26.6 - 33.0 pg   MCHC 32.5 31.5 - 35.7 g/dL   RDW 36.6 44.0 - 34.7 %   Platelets 197 150 - 450 x10E3/uL   Neutrophils 82 Not Estab. %   Lymphs 14 Not Estab. %   Monocytes 2 Not Estab. %   Eos 1 Not Estab. %   Basos 1 Not Estab. %   Neutrophils Absolute 4.6 1.4 - 7.0 x10E3/uL   Lymphocytes Absolute 0.8 0.7 - 3.1 x10E3/uL   Monocytes Absolute 0.1 0.1 - 0.9 x10E3/uL   EOS (ABSOLUTE) 0.1 0.0 - 0.4 x10E3/uL   Basophils Absolute 0.0 0.0 - 0.2 x10E3/uL   Immature Granulocytes 0 Not Estab. %   Immature Grans (Abs) 0.0 0.0 - 0.1 x10E3/uL  TSH  Result Value Ref Range   TSH 1.880 0.450 - 4.500 uIU/mL        Assessment & Plan Hospitalization follow-up Type 1 Diabetes Mellitus with DKA Recent DKA hospitalization.  Blood glucose fluctuates; fasting 80s-90s mg/dL, postprandial 425-956 mg/dL. Insulin -sensitive. A1c >8%. - Continue current insulin  regimen. - Follow up with endocrinologist in July. - Consider increasing insulin  dose if blood glucose levels rise significantly. Pt declined, preferred to follow-up with endocrinology - Check lipids. Will follow-up  Congestive Heart Failure Symptom improvement. Recent echocardiogram satisfactory. - Continue current management. - Ensure follow-up with cardiologist in one year.  Chronic Kidney Disease, Stage 3B CKD stage 3B with slightly elevated blood pressure. - Refer to nephrology for CKD management.  Adrenal Insufficiency Chronic Stable on Cortef  20 mg AM, 10 mg PM. - Continue current Cortef  regimen. Follow-up with endocrinology  Anemia Chronic anemia without specialist follow-up. - Consider referral to hematology if anemia persists.  Depression Chronic    10/21/2023    2:58 PM 05/10/2023   10:25 AM 11/11/2020    4:49 PM  PHQ9 SCORE ONLY  PHQ-9 Total Score 10 23 0   Ongoing depression with some improvement. - Refer to psychiatry for evaluation and management. - Provide information on urgent mental health care services in [place]. Will follow-up  Tinnitus Persistent right ear whooshing sound post-ear infection. - Refer to ENT for evaluation of tinnitus and possible imaging.  Type 1 diabetes mellitus without complications (HCC) (Primary) Chronic Stable Continue current insulin  regimen: see hpi Novolog , lantus  - Ambulatory referral to Nephrology - Basic metabolic panel with GFR - CBC with Differential/Platelet - TSH Continue low carb diet and exercise Will reassess after  receiving lab results  HFrEF (heart failure with reduced ejection fraction) (HCC) Chronic recheck - Basic metabolic panel with GFR - CBC with Differential/Platelet - TSH Will reassess after  receiving lab results  Other specified  hypothyroidism Chronic Continue levothyroxine  75 mcg Ordered -TSH Will follow-up  Stage 3b chronic kidney disease (HCC) - Ambulatory referral to Nephrology  Depression, recurrent (HCC) - Basic metabolic panel with GFR - CBC with Differential/Platelet - TSH - Ambulatory referral to Psychiatry  Tinnitus of right ear - Ambulatory referral to ENT   Orders Placed This Encounter  Procedures   Basic metabolic panel with GFR   CBC with Differential/Platelet   TSH   Ambulatory referral to Nephrology    Referral Priority:   Routine    Referral Type:   Consultation    Referral Reason:   Specialty Services Required    Requested Specialty:   Nephrology    Number of Visits Requested:   1   Ambulatory referral to Psychiatry    Referral Priority:   Routine    Referral Type:   Psychiatric    Referral Reason:   Specialty Services Required    Requested Specialty:   Psychiatry    Number of Visits Requested:   1   Ambulatory referral to ENT    Referral Priority:   Routine    Referral Type:   Consultation    Referral Reason:   Specialty Services Required    Requested Specialty:   Otolaryngology    Number of Visits Requested:   1    Return in about 6 weeks (around 12/02/2023) for chronic disease f/u.   The patient was advised to call back or seek an in-person evaluation if the symptoms worsen or if the condition fails to improve as anticipated.  I discussed the assessment and treatment plan with the patient. The patient was provided an opportunity to ask questions and all were answered. The patient agreed with the plan and demonstrated an understanding of the instructions.  I, Valynn Schamberger, PA-C have reviewed all documentation for this visit. The documentation on 10/21/2023  for the exam, diagnosis, procedures, and orders are all accurate and complete.  Blane Bunting, [facility], MMS [facility] [phone] (phone) [phone] (fax)  [facility]

## 2023-10-22 LAB — CBC WITH DIFFERENTIAL/PLATELET
Basophils Absolute: 0 10*3/uL (ref 0.0–0.2)
Basos: 1 %
EOS (ABSOLUTE): 0.1 10*3/uL (ref 0.0–0.4)
Eos: 1 %
Hematocrit: 28.6 % — ABNORMAL LOW (ref 34.0–46.6)
Hemoglobin: 9.3 g/dL — ABNORMAL LOW (ref 11.1–15.9)
Immature Grans (Abs): 0 10*3/uL (ref 0.0–0.1)
Immature Granulocytes: 0 %
Lymphocytes Absolute: 0.8 10*3/uL (ref 0.7–3.1)
Lymphs: 14 %
MCH: 29.9 pg (ref 26.6–33.0)
MCHC: 32.5 g/dL (ref 31.5–35.7)
MCV: 92 fL (ref 79–97)
Monocytes Absolute: 0.1 10*3/uL (ref 0.1–0.9)
Monocytes: 2 %
Neutrophils Absolute: 4.6 10*3/uL (ref 1.4–7.0)
Neutrophils: 82 %
Platelets: 197 10*3/uL (ref 150–450)
RBC: 3.11 x10E6/uL — ABNORMAL LOW (ref 3.77–5.28)
RDW: 12.7 % (ref 11.7–15.4)
WBC: 5.6 10*3/uL (ref 3.4–10.8)

## 2023-10-22 LAB — BASIC METABOLIC PANEL WITH GFR
BUN/Creatinine Ratio: 18 (ref 9–23)
BUN: 29 mg/dL — ABNORMAL HIGH (ref 6–20)
CO2: 18 mmol/L — ABNORMAL LOW (ref 20–29)
Calcium: 8.9 mg/dL (ref 8.7–10.2)
Chloride: 106 mmol/L (ref 96–106)
Creatinine, Ser: 1.61 mg/dL — ABNORMAL HIGH (ref 0.57–1.00)
Glucose: 209 mg/dL — ABNORMAL HIGH (ref 70–99)
Potassium: 5.3 mmol/L — ABNORMAL HIGH (ref 3.5–5.2)
Sodium: 143 mmol/L (ref 134–144)
eGFR: 43 mL/min/{1.73_m2} — ABNORMAL LOW (ref >59)

## 2023-10-22 LAB — TSH: TSH: 1.88 u[IU]/mL (ref 0.450–4.500)

## 2023-10-25 DIAGNOSIS — N1832 Chronic kidney disease, stage 3b: Secondary | ICD-10-CM | POA: Insufficient documentation

## 2023-10-25 DIAGNOSIS — H9311 Tinnitus, right ear: Secondary | ICD-10-CM | POA: Insufficient documentation

## 2023-10-25 DIAGNOSIS — F339 Major depressive disorder, recurrent, unspecified: Secondary | ICD-10-CM | POA: Insufficient documentation

## 2023-10-31 ENCOUNTER — Emergency Department

## 2023-10-31 ENCOUNTER — Ambulatory Visit: Payer: Self-pay | Admitting: Physician Assistant

## 2023-10-31 ENCOUNTER — Other Ambulatory Visit: Payer: Self-pay

## 2023-10-31 ENCOUNTER — Encounter: Payer: Self-pay | Admitting: Internal Medicine

## 2023-10-31 ENCOUNTER — Inpatient Hospital Stay
Admission: EM | Admit: 2023-10-31 | Discharge: 2023-11-04 | DRG: 872 | Disposition: A | Discharge status: Home / Self Care | Attending: Internal Medicine | Admitting: Internal Medicine

## 2023-10-31 DIAGNOSIS — D631 Anemia in chronic kidney disease: Secondary | ICD-10-CM | POA: Diagnosis present

## 2023-10-31 DIAGNOSIS — A4181 Sepsis due to Enterococcus: Secondary | ICD-10-CM | POA: Diagnosis not present

## 2023-10-31 DIAGNOSIS — Z79890 Hormone replacement therapy (postmenopausal): Secondary | ICD-10-CM

## 2023-10-31 DIAGNOSIS — E1043 Type 1 diabetes mellitus with diabetic autonomic (poly)neuropathy: Secondary | ICD-10-CM | POA: Diagnosis present

## 2023-10-31 DIAGNOSIS — E039 Hypothyroidism, unspecified: Secondary | ICD-10-CM | POA: Diagnosis not present

## 2023-10-31 DIAGNOSIS — N1832 Chronic kidney disease, stage 3b: Secondary | ICD-10-CM | POA: Diagnosis not present

## 2023-10-31 DIAGNOSIS — R000 Tachycardia, unspecified: Secondary | ICD-10-CM | POA: Diagnosis not present

## 2023-10-31 DIAGNOSIS — Z419 Encounter for procedure for purposes other than remedying health state, unspecified: Secondary | ICD-10-CM | POA: Diagnosis not present

## 2023-10-31 DIAGNOSIS — E1022 Type 1 diabetes mellitus with diabetic chronic kidney disease: Secondary | ICD-10-CM | POA: Diagnosis present

## 2023-10-31 DIAGNOSIS — Z8249 Family history of ischemic heart disease and other diseases of the circulatory system: Secondary | ICD-10-CM

## 2023-10-31 DIAGNOSIS — E272 Addisonian crisis: Secondary | ICD-10-CM | POA: Diagnosis not present

## 2023-10-31 DIAGNOSIS — N3289 Other specified disorders of bladder: Secondary | ICD-10-CM | POA: Diagnosis not present

## 2023-10-31 DIAGNOSIS — E10649 Type 1 diabetes mellitus with hypoglycemia without coma: Secondary | ICD-10-CM | POA: Diagnosis present

## 2023-10-31 DIAGNOSIS — E271 Primary adrenocortical insufficiency: Secondary | ICD-10-CM | POA: Diagnosis not present

## 2023-10-31 DIAGNOSIS — N390 Urinary tract infection, site not specified: Secondary | ICD-10-CM

## 2023-10-31 DIAGNOSIS — A419 Sepsis, unspecified organism: Secondary | ICD-10-CM | POA: Diagnosis not present

## 2023-10-31 DIAGNOSIS — E872 Acidosis, unspecified: Secondary | ICD-10-CM | POA: Diagnosis not present

## 2023-10-31 DIAGNOSIS — A0811 Acute gastroenteropathy due to Norwalk agent: Secondary | ICD-10-CM | POA: Diagnosis not present

## 2023-10-31 DIAGNOSIS — Z1152 Encounter for screening for COVID-19: Secondary | ICD-10-CM | POA: Diagnosis not present

## 2023-10-31 DIAGNOSIS — E16A2 Hypoglycemia level 2: Secondary | ICD-10-CM | POA: Diagnosis present

## 2023-10-31 DIAGNOSIS — I5032 Chronic diastolic (congestive) heart failure: Secondary | ICD-10-CM | POA: Diagnosis present

## 2023-10-31 DIAGNOSIS — Z803 Family history of malignant neoplasm of breast: Secondary | ICD-10-CM

## 2023-10-31 DIAGNOSIS — R7881 Bacteremia: Secondary | ICD-10-CM | POA: Diagnosis not present

## 2023-10-31 DIAGNOSIS — I130 Hypertensive heart and chronic kidney disease with heart failure and stage 1 through stage 4 chronic kidney disease, or unspecified chronic kidney disease: Secondary | ICD-10-CM | POA: Diagnosis not present

## 2023-10-31 DIAGNOSIS — E1065 Type 1 diabetes mellitus with hyperglycemia: Secondary | ICD-10-CM | POA: Diagnosis not present

## 2023-10-31 DIAGNOSIS — Z801 Family history of malignant neoplasm of trachea, bronchus and lung: Secondary | ICD-10-CM

## 2023-10-31 DIAGNOSIS — R6520 Severe sepsis without septic shock: Secondary | ICD-10-CM | POA: Diagnosis present

## 2023-10-31 DIAGNOSIS — Z79899 Other long term (current) drug therapy: Secondary | ICD-10-CM

## 2023-10-31 DIAGNOSIS — E162 Hypoglycemia, unspecified: Secondary | ICD-10-CM

## 2023-10-31 DIAGNOSIS — E861 Hypovolemia: Secondary | ICD-10-CM | POA: Diagnosis present

## 2023-10-31 DIAGNOSIS — B9789 Other viral agents as the cause of diseases classified elsewhere: Secondary | ICD-10-CM | POA: Diagnosis present

## 2023-10-31 DIAGNOSIS — G43909 Migraine, unspecified, not intractable, without status migrainosus: Secondary | ICD-10-CM | POA: Diagnosis present

## 2023-10-31 DIAGNOSIS — E109 Type 1 diabetes mellitus without complications: Secondary | ICD-10-CM

## 2023-10-31 DIAGNOSIS — N3000 Acute cystitis without hematuria: Secondary | ICD-10-CM | POA: Diagnosis present

## 2023-10-31 DIAGNOSIS — Z794 Long term (current) use of insulin: Secondary | ICD-10-CM

## 2023-10-31 DIAGNOSIS — E11649 Type 2 diabetes mellitus with hypoglycemia without coma: Secondary | ICD-10-CM | POA: Diagnosis not present

## 2023-10-31 DIAGNOSIS — K909 Intestinal malabsorption, unspecified: Secondary | ICD-10-CM | POA: Diagnosis present

## 2023-10-31 DIAGNOSIS — K3184 Gastroparesis: Secondary | ICD-10-CM | POA: Diagnosis present

## 2023-10-31 DIAGNOSIS — A4189 Other specified sepsis: Secondary | ICD-10-CM | POA: Diagnosis present

## 2023-10-31 DIAGNOSIS — E876 Hypokalemia: Secondary | ICD-10-CM | POA: Diagnosis not present

## 2023-10-31 DIAGNOSIS — Z833 Family history of diabetes mellitus: Secondary | ICD-10-CM

## 2023-10-31 LAB — URINALYSIS, W/ REFLEX TO CULTURE (INFECTION SUSPECTED)
Bilirubin Urine: NEGATIVE
Bilirubin Urine: NEGATIVE
Glucose, UA: 50 mg/dL — AB
Glucose, UA: 50 mg/dL — AB
Ketones, ur: NEGATIVE mg/dL
Ketones, ur: NEGATIVE mg/dL
Nitrite: NEGATIVE
Nitrite: NEGATIVE
Protein, ur: 100 mg/dL — AB
Protein, ur: 100 mg/dL — AB
Specific Gravity, Urine: 1.023 (ref 1.005–1.030)
Specific Gravity, Urine: 1.041 — ABNORMAL HIGH (ref 1.005–1.030)
pH: 5 (ref 5.0–8.0)
pH: 5 (ref 5.0–8.0)

## 2023-10-31 LAB — CBC WITH DIFFERENTIAL/PLATELET
Abs Immature Granulocytes: 0.07 10*3/uL (ref 0.00–0.07)
Abs Immature Granulocytes: 0.06 10*3/uL (ref 0.00–0.07)
Basophils Absolute: 0 10*3/uL (ref 0.0–0.1)
Basophils Absolute: 0 10*3/uL (ref 0.0–0.1)
Basophils Relative: 0 %
Basophils Relative: 0 %
Eosinophils Absolute: 0.1 10*3/uL (ref 0.0–0.5)
Eosinophils Absolute: 0 10*3/uL (ref 0.0–0.5)
Eosinophils Relative: 1 %
Eosinophils Relative: 0 %
HCT: 25.6 % — ABNORMAL LOW (ref 36.0–46.0)
HCT: 26.6 % — ABNORMAL LOW (ref 36.0–46.0)
Hemoglobin: 8.4 g/dL — ABNORMAL LOW (ref 12.0–15.0)
Hemoglobin: 9.1 g/dL — ABNORMAL LOW (ref 12.0–15.0)
Immature Granulocytes: 1 %
Immature Granulocytes: 1 %
Lymphocytes Relative: 4 %
Lymphocytes Relative: 4 %
Lymphs Abs: 0.5 10*3/uL — ABNORMAL LOW (ref 0.7–4.0)
Lymphs Abs: 0.4 10*3/uL — ABNORMAL LOW (ref 0.7–4.0)
MCH: 30.1 pg (ref 26.0–34.0)
MCH: 31 pg (ref 26.0–34.0)
MCHC: 32.8 g/dL (ref 30.0–36.0)
MCHC: 34.2 g/dL (ref 30.0–36.0)
MCV: 91.8 fL (ref 80.0–100.0)
MCV: 90.5 fL (ref 80.0–100.0)
Monocytes Absolute: 0.5 10*3/uL (ref 0.1–1.0)
Monocytes Absolute: 0.1 10*3/uL (ref 0.1–1.0)
Monocytes Relative: 4 %
Monocytes Relative: 1 %
Neutro Abs: 11.2 10*3/uL — ABNORMAL HIGH (ref 1.7–7.7)
Neutro Abs: 10.5 10*3/uL — ABNORMAL HIGH (ref 1.7–7.7)
Neutrophils Relative %: 90 %
Neutrophils Relative %: 94 %
Platelets: 125 10*3/uL — ABNORMAL LOW (ref 150–400)
Platelets: 135 10*3/uL — ABNORMAL LOW (ref 150–400)
RBC: 2.79 MIL/uL — ABNORMAL LOW (ref 3.87–5.11)
RBC: 2.94 MIL/uL — ABNORMAL LOW (ref 3.87–5.11)
RDW: 13.3 % (ref 11.5–15.5)
RDW: 13.7 % (ref 11.5–15.5)
WBC: 12.4 10*3/uL — ABNORMAL HIGH (ref 4.0–10.5)
WBC: 11 10*3/uL — ABNORMAL HIGH (ref 4.0–10.5)
nRBC: 0 % (ref 0.0–0.2)
nRBC: 0 % (ref 0.0–0.2)

## 2023-10-31 LAB — LACTIC ACID, PLASMA
Lactic Acid, Venous: 3.1 mmol/L (ref 0.5–1.9)
Lactic Acid, Venous: 1.4 mmol/L (ref 0.5–1.9)
Lactic Acid, Venous: 6.8 mmol/L (ref 0.5–1.9)
Lactic Acid, Venous: 1.5 mmol/L (ref 0.5–1.9)
Lactic Acid, Venous: 1.2 mmol/L (ref 0.5–1.9)
Lactic Acid, Venous: 1 mmol/L (ref 0.5–1.9)

## 2023-10-31 LAB — COMPREHENSIVE METABOLIC PANEL WITH GFR
ALT: 12 U/L (ref 0–44)
ALT: 8 U/L (ref 0–44)
AST: 23 U/L (ref 15–41)
AST: 14 U/L — ABNORMAL LOW (ref 15–41)
Albumin: 4 g/dL (ref 3.5–5.0)
Albumin: 2 g/dL — ABNORMAL LOW (ref 3.5–5.0)
Alkaline Phosphatase: 48 U/L (ref 38–126)
Alkaline Phosphatase: 25 U/L — ABNORMAL LOW (ref 38–126)
Anion gap: 9 (ref 5–15)
Anion gap: 13 (ref 5–15)
BUN: 42 mg/dL — ABNORMAL HIGH (ref 6–20)
BUN: 28 mg/dL — ABNORMAL HIGH (ref 6–20)
CO2: 24 mmol/L (ref 22–32)
CO2: 15 mmol/L — ABNORMAL LOW (ref 22–32)
Calcium: 8.7 mg/dL — ABNORMAL LOW (ref 8.9–10.3)
Calcium: 7 mg/dL — ABNORMAL LOW (ref 8.9–10.3)
Chloride: 108 mmol/L (ref 98–111)
Chloride: 109 mmol/L (ref 98–111)
Creatinine, Ser: 1.65 mg/dL — ABNORMAL HIGH (ref 0.44–1.00)
Creatinine, Ser: 1.07 mg/dL — ABNORMAL HIGH (ref 0.44–1.00)
GFR, Estimated: 42 mL/min — ABNORMAL LOW (ref >60)
GFR, Estimated: >60 mL/min (ref >60)
Glucose, Bld: 72 mg/dL (ref 70–99)
Glucose, Bld: 177 mg/dL — ABNORMAL HIGH (ref 70–99)
Potassium: 4.9 mmol/L (ref 3.5–5.1)
Potassium: 4.4 mmol/L (ref 3.5–5.1)
Sodium: 141 mmol/L (ref 135–145)
Sodium: 137 mmol/L (ref 135–145)
Total Bilirubin: 0.6 mg/dL (ref 0.0–1.2)
Total Bilirubin: 0.3 mg/dL (ref 0.0–1.2)
Total Protein: 7.3 g/dL (ref 6.5–8.1)
Total Protein: 3.9 g/dL — ABNORMAL LOW (ref 6.5–8.1)

## 2023-10-31 LAB — BLOOD CULTURE ID PANEL (REFLEXED) - BCID2

## 2023-10-31 LAB — CBG MONITORING, ED
Glucose-Capillary: 53 mg/dL — ABNORMAL LOW (ref 70–99)
Glucose-Capillary: 84 mg/dL (ref 70–99)
Glucose-Capillary: 181 mg/dL — ABNORMAL HIGH (ref 70–99)
Glucose-Capillary: 154 mg/dL — ABNORMAL HIGH (ref 70–99)
Glucose-Capillary: 75 mg/dL (ref 70–99)
Glucose-Capillary: 57 mg/dL — ABNORMAL LOW (ref 70–99)
Glucose-Capillary: 198 mg/dL — ABNORMAL HIGH (ref 70–99)
Glucose-Capillary: 176 mg/dL — ABNORMAL HIGH (ref 70–99)
Glucose-Capillary: 204 mg/dL — ABNORMAL HIGH (ref 70–99)
Glucose-Capillary: 218 mg/dL — ABNORMAL HIGH (ref 70–99)
Glucose-Capillary: 230 mg/dL — ABNORMAL HIGH (ref 70–99)

## 2023-10-31 LAB — BLOOD GAS, VENOUS
Acid-Base Excess: 3.7 mmol/L — ABNORMAL HIGH (ref 0.0–2.0)
Bicarbonate: 29.7 mmol/L — ABNORMAL HIGH (ref 20.0–28.0)
O2 Saturation: 83.6 %
Patient temperature: 37
pCO2, Ven: 49 mmHg (ref 44–60)
pH, Ven: 7.39 (ref 7.25–7.43)
pO2, Ven: 51 mmHg — ABNORMAL HIGH (ref 32–45)

## 2023-10-31 LAB — RESP PANEL BY RT-PCR (RSV, FLU A&B, COVID)  RVPGX2
Influenza A by PCR: NEGATIVE
Influenza B by PCR: NEGATIVE
Resp Syncytial Virus by PCR: NEGATIVE
SARS Coronavirus 2 by RT PCR: NEGATIVE

## 2023-10-31 LAB — GLUCOSE, CAPILLARY
Glucose-Capillary: 141 mg/dL — ABNORMAL HIGH (ref 70–99)
Glucose-Capillary: 226 mg/dL — ABNORMAL HIGH (ref 70–99)
Glucose-Capillary: 209 mg/dL — ABNORMAL HIGH (ref 70–99)

## 2023-10-31 LAB — HCG, QUANTITATIVE, PREGNANCY: hCG, Beta Chain, Quant, S: 2 m[IU]/mL (ref <5)

## 2023-10-31 LAB — PHOSPHORUS: Phosphorus: 1.9 mg/dL — ABNORMAL LOW (ref 2.5–4.6)

## 2023-10-31 LAB — PROTIME-INR
INR: 1.1 (ref 0.8–1.2)
Prothrombin Time: 14.3 s (ref 11.4–15.2)

## 2023-10-31 LAB — PROCALCITONIN: Procalcitonin: 53.01 ng/mL

## 2023-10-31 LAB — C-REACTIVE PROTEIN: CRP: 4.6 mg/dL — ABNORMAL HIGH (ref <1.0)

## 2023-10-31 LAB — MAGNESIUM: Magnesium: 1.2 mg/dL — ABNORMAL LOW (ref 1.7–2.4)

## 2023-10-31 LAB — BETA-HYDROXYBUTYRIC ACID: Beta-Hydroxybutyric Acid: 0.06 mmol/L (ref 0.05–0.27)

## 2023-10-31 MED ORDER — HYDROCORTISONE SOD SUC (PF) 100 MG IJ SOLR
100.0000 mg | Freq: Three times a day (TID) | INTRAMUSCULAR | Status: DC
  Administered 2023-10-31 (×2): 100 mg via INTRAVENOUS
  Filled 2023-10-31 (×4): qty 2

## 2023-10-31 MED ORDER — MAGNESIUM SULFATE 4 GM/100ML IV SOLN
4.0000 g | Freq: Once | INTRAVENOUS | Status: AC
  Administered 2023-10-31: 4 g via INTRAVENOUS
  Filled 2023-10-31: qty 100

## 2023-10-31 MED ORDER — DEXTROSE 50 % IV SOLN
1.0000 | INTRAVENOUS | Status: DC | PRN
  Administered 2023-10-31: 50 mL via INTRAVENOUS
  Filled 2023-10-31: qty 50

## 2023-10-31 MED ORDER — SODIUM CHLORIDE 0.9 % IV BOLUS
500.0000 mL | Freq: Once | INTRAVENOUS | Status: AC
  Administered 2023-10-31: 500 mL via INTRAVENOUS

## 2023-10-31 MED ORDER — IOHEXOL 300 MG/ML  SOLN
75.0000 mL | Freq: Once | INTRAMUSCULAR | Status: AC | PRN
  Administered 2023-10-31: 75 mL via INTRAVENOUS

## 2023-10-31 MED ORDER — SODIUM CHLORIDE 0.9 % IV SOLN
1.0000 g | INTRAVENOUS | Status: AC
  Administered 2023-10-31: 1 g via INTRAVENOUS
  Filled 2023-10-31: qty 10

## 2023-10-31 MED ORDER — DEXTROSE 50 % IV SOLN: 25.0000 mL | INTRAVENOUS | Status: DC | PRN

## 2023-10-31 MED ORDER — SODIUM CHLORIDE 0.9 % IV SOLN
2.0000 g | INTRAVENOUS | Status: DC
  Administered 2023-11-01 – 2023-11-02 (×2): 2 g via INTRAVENOUS
  Filled 2023-10-31 (×2): qty 20

## 2023-10-31 MED ORDER — ONDANSETRON HCL 4 MG PO TABS
4.0000 mg | ORAL_TABLET | Freq: Four times a day (QID) | ORAL | Status: AC | PRN
Start: 2023-10-31 — End: ?

## 2023-10-31 MED ORDER — LACTATED RINGERS IV SOLN: 150.0000 mL/h | INTRAVENOUS | Status: DC

## 2023-10-31 MED ORDER — HYDROCORTISONE SOD SUC (PF) 100 MG IJ SOLR
100.0000 mg | Freq: Once | INTRAMUSCULAR | Status: AC
  Administered 2023-10-31: 100 mg via INTRAVENOUS
  Filled 2023-10-31: qty 2

## 2023-10-31 MED ORDER — VANCOMYCIN HCL 750 MG/150ML IV SOLN
750.0000 mg | INTRAVENOUS | Status: DC
  Administered 2023-11-01: 750 mg via INTRAVENOUS
  Filled 2023-10-31: qty 150

## 2023-10-31 MED ORDER — LACTATED RINGERS IV SOLN: INTRAVENOUS | Status: AC

## 2023-10-31 MED ORDER — SODIUM CHLORIDE 0.9 % IV SOLN
1.0000 g | Freq: Once | INTRAVENOUS | Status: AC
  Administered 2023-10-31: 1 g via INTRAVENOUS
  Filled 2023-10-31: qty 10

## 2023-10-31 MED ORDER — SODIUM CHLORIDE 0.9 % IV SOLN: 2.0000 g | INTRAVENOUS | Status: DC

## 2023-10-31 MED ORDER — ACETAMINOPHEN 650 MG RE SUPP: 650.0000 mg | Freq: Four times a day (QID) | RECTAL | Status: DC | PRN

## 2023-10-31 MED ORDER — ACETAMINOPHEN 325 MG PO TABS
650.0000 mg | ORAL_TABLET | Freq: Four times a day (QID) | ORAL | Status: DC | PRN
  Administered 2023-10-31 – 2023-11-04 (×5): 650 mg via ORAL
  Filled 2023-10-31 (×6): qty 2

## 2023-10-31 MED ORDER — ENOXAPARIN SODIUM 40 MG/0.4ML IJ SOSY
40.0000 mg | PREFILLED_SYRINGE | INTRAMUSCULAR | Status: DC
  Administered 2023-10-31 – 2023-11-01 (×2): 40 mg via SUBCUTANEOUS
  Filled 2023-10-31 (×3): qty 0.4

## 2023-10-31 MED ORDER — ONDANSETRON HCL 4 MG/2ML IJ SOLN
4.0000 mg | Freq: Four times a day (QID) | INTRAMUSCULAR | Status: DC | PRN
  Administered 2023-11-02 – 2023-11-03 (×4): 4 mg via INTRAVENOUS
  Filled 2023-10-31 (×5): qty 2

## 2023-10-31 MED ORDER — ALBUMIN HUMAN 25 % IV SOLN
12.5000 g | Freq: Once | INTRAVENOUS | Status: AC
  Administered 2023-10-31: 12.5 g via INTRAVENOUS
  Filled 2023-10-31: qty 50

## 2023-10-31 MED ORDER — SODIUM CHLORIDE 0.9 % IV SOLN: INTRAVENOUS | Status: DC

## 2023-10-31 MED ORDER — INSULIN ASPART 100 UNIT/ML IJ SOLN
0.0000 [IU] | INTRAMUSCULAR | Status: DC
  Administered 2023-10-31 – 2023-11-01 (×3): 2 [IU] via SUBCUTANEOUS
  Administered 2023-11-01: 1 [IU] via SUBCUTANEOUS
  Administered 2023-11-01: 2 [IU] via SUBCUTANEOUS
  Administered 2023-11-01: 3 [IU] via SUBCUTANEOUS
  Administered 2023-11-01 – 2023-11-02 (×2): 2 [IU] via SUBCUTANEOUS
  Administered 2023-11-02 – 2023-11-03 (×4): 3 [IU] via SUBCUTANEOUS
  Administered 2023-11-03: 2 [IU] via SUBCUTANEOUS
  Administered 2023-11-03 – 2023-11-04 (×2): 1 [IU] via SUBCUTANEOUS
  Filled 2023-10-31 (×15): qty 1

## 2023-10-31 MED ORDER — HYDROCORTISONE 10 MG PO TABS: 10.0000 mg | ORAL_TABLET | Freq: Every day | ORAL | Status: DC

## 2023-10-31 MED ORDER — SODIUM PHOSPHATES 45 MMOLE/15ML IV SOLN
30.0000 mmol | Freq: Once | INTRAVENOUS | Status: AC
  Administered 2023-10-31: 30 mmol via INTRAVENOUS
  Filled 2023-10-31: qty 10

## 2023-10-31 MED ORDER — INSULIN GLARGINE-YFGN 100 UNIT/ML ~~LOC~~ SOLN
10.0000 [IU] | Freq: Every day | SUBCUTANEOUS | Status: DC
Start: 2023-10-31 — End: 2023-11-01
  Administered 2023-10-31: 10 [IU] via SUBCUTANEOUS
  Filled 2023-10-31: qty 0.1

## 2023-10-31 MED ORDER — ALBUTEROL SULFATE (2.5 MG/3ML) 0.083% IN NEBU: 2.5000 mg | INHALATION_SOLUTION | RESPIRATORY_TRACT | Status: DC | PRN

## 2023-10-31 MED ORDER — HYDROCODONE-ACETAMINOPHEN 5-325 MG PO TABS: 1.0000 | ORAL_TABLET | ORAL | Status: DC | PRN

## 2023-10-31 MED ORDER — DEXTROSE 10 % IV SOLN
250.0000 mL | Freq: Once | INTRAVENOUS | Status: AC
  Administered 2023-10-31: 250 mL via INTRAVENOUS

## 2023-10-31 MED ORDER — MIDODRINE HCL 5 MG PO TABS: 2.5000 mg | ORAL_TABLET | Freq: Three times a day (TID) | ORAL | Status: DC | PRN

## 2023-10-31 MED ORDER — LEVOTHYROXINE SODIUM 50 MCG PO TABS
75.0000 ug | ORAL_TABLET | Freq: Every day | ORAL | Status: DC
  Administered 2023-10-31 – 2023-11-04 (×5): 75 ug via ORAL
  Filled 2023-10-31 (×5): qty 1

## 2023-10-31 MED ORDER — HYDROCORTISONE 10 MG PO TABS: 20.0000 mg | ORAL_TABLET | Freq: Every day | ORAL | Status: DC

## 2023-10-31 MED ORDER — VANCOMYCIN HCL IN DEXTROSE 1-5 GM/200ML-% IV SOLN
1000.0000 mg | Freq: Once | INTRAVENOUS | Status: AC
  Administered 2023-10-31: 1000 mg via INTRAVENOUS
  Filled 2023-10-31: qty 200

## 2023-10-31 NOTE — Progress Notes (Signed)
 Paged Dr. Andy Bannister to confirm discontinuation of Q1 hour blood sugar checks after secure chatting her with that quesiton and no reply earlier. It is now [phone] with no reply to my page from 1850. Will monitor patient.

## 2023-10-31 NOTE — ED Notes (Signed)
 This RN gave pt some apple juice and graham crackers for low CBG. This RN also notified MD Vallarie Gauze and awaiting orders.

## 2023-10-31 NOTE — H&P (Signed)
 History and Physical    CARTIER MAPEL AVW:[id number] DOB: 04-09-1991 DOA: 10/31/2023  PCP: Ostwalt, [name], PA-C  Patient coming from: home  I have personally briefly reviewed patient's old medical records in [employer] [facility]  Chief Complaint: hypoglycemia  HPI: Ashley Camacho is a 33 y.o. female with medical history significant of  Adrenal insufficiency, Anemia,Chronic combined CHF, CKD3b, Gastroparesis, hypertension, hypothyroidism, DM Type 1 who presents to ED BIB EMS due to severe hypoglycemia at home. Per patient she took insulin  at 6pm after dinner and noted  hypoglycemia there after.  She states she also note episodes of hypoglycemia the day before, however these episodes resolved with treatment. On arrival , EMS noted patient glucose was 38.  Patient was treated with 1mg  of glucagon IM as well as 1/2 tube of glucose gel. Of note patient has recent interim history of admission  5/18-5/21 with dx of DKA.  Patient currently denies any associated fever /chills/ chest pain /sob /dysuria  but has noted increase in baseline diarrhea as well as episode of n/v. She states that she did have sick contact with GI illness a few days prior. She noted no chills or fever but states she was told she has fever by EMS.  ED Course:  Vitals: afeb, bp 162/110, hr 143, rr 18 sat  Vbg:7.39/co2 49 Na 141, K 4.9, cl 108, bicarb 24, cr 1.65-1.38 at baseline  Lact 3.1 reeat 1.4 Wbc 12.4, hgb 8.4 close to baseline ,plt 125 pmn 90 Cxr:NAD CTAB/pelvis: IMPRESSION: 1. No acute abnormality of the abdomen or pelvis. 2. Mild wall thickening of the urinary bladder, possibly exaggerated by underdistention. Correlate with urinalysis to exclude cystitis. UA :small LE, rare bacteria Fs on arrival 53, >154,> 57>176>204 RVP: neg EKG: Sinus tachycardia  TX: NS 500cc , solucortef 100mg  , dextrose  10%   Review of Systems: As per HPI otherwise 10 point review of systems negative.   Past Medical History:  Diagnosis  Date   Acute metabolic encephalopathy 06/10/2022   Adrenal insufficiency (HCC)    Anemia    CHF (congestive heart failure) (HCC)    Gastroparesis    Hypertension    Hypotension    Intractable nausea and vomiting 11/13/2022   Migraine    Thyroid  disease    Type 1 diabetes [facility])     Past Surgical History:  Procedure Laterality Date   BIOPSY  01/14/2021   Procedure: BIOPSY;  Surgeon: Lindle Rhea, MD;  Location: [facility] ENDOSCOPY;  Service: Gastroenterology;;   CENTRAL LINE INSERTION N/A 10/10/2023   Procedure: CENTRAL LINE INSERTION;  Surgeon: Celso College, MD;  Location: [facility] INVASIVE CV LAB;  Service: Cardiovascular;  Laterality: N/A;   CESAREAN SECTION     x2   CESAREAN SECTION WITH BILATERAL TUBAL LIGATION N/A 02/17/2021   Procedure: CESAREAN SECTION WITH BILATERAL TUBAL LIGATION;  Surgeon: Malka Sea, DO;  Location: [facility] LD ORS;  Service: Obstetrics;  Laterality: N/A;   ESOPHAGOGASTRODUODENOSCOPY (EGD) WITH PROPOFOL  N/A 01/14/2021   Procedure: ESOPHAGOGASTRODUODENOSCOPY (EGD) WITH PROPOFOL ;  Surgeon: Lindle Rhea, MD;  Location: [facility] ENDOSCOPY;  Service: Gastroenterology;  Laterality: N/A;   IR BONE MARROW BIOPSY & ASPIRATION  12/16/2022   IR REPLC DUODEN/JEJUNO TUBE PERCUT W/FLUORO  11/22/2022   JEJUNOSTOMY N/A 06/13/2022   Procedure: Tonette Franco;  Surgeon: Alben Alma, MD;  Location: [facility] ORS;  Service: General;  Laterality: N/A;   MOUTH SURGERY       reports that she has never smoked. She has never been exposed to  tobacco smoke. She has never used smokeless tobacco. She reports that she does not drink alcohol and does not use drugs.  No Known Allergies  Family History  Problem Relation Age of Onset   Breast cancer Mother    Seizures Mother    Diabetes type I Father    CAD Father    Lung cancer Maternal Grandfather    CAD Paternal Grandmother    CAD Paternal Grandfather    Ovarian cancer Neg Hx     Prior to Admission medications   Medication Sig Start  Date End Date Taking? Authorizing Provider  acetaminophen  (TYLENOL ) 500 MG tablet Take 2 tablets (1,000 mg total) by mouth every 6 (six) hours as needed for headache, fever or moderate pain. 08/05/22  Yes Djan, Esther Hem, MD  BAQSIMI ONE PACK 3 MG/DOSE POWD Place 3 mg into the nose as needed. 06/09/23  Yes [provider]  BD PEN NEEDLE MINI ULTRAFINE 31G X 5 MM MISC by Other route. 07/18/23 07/17/24 Yes [provider]  cholecalciferol  (VITAMIN D3) 25 MCG (1000 UNIT) tablet VITAMIN D3 25 MCG (1000 UT) TABS 08/30/17  Yes [provider]  divalproex  (DEPAKOTE ) 250 MG DR tablet Take 250 mg by mouth daily. 09/07/23 12/06/23 Yes [provider]  hydrocortisone  (CORTEF ) 10 MG tablet Take 2 tablets (20 mg total) by mouth daily with breakfast AND 1 tablet (10 mg total) daily with supper. 05/27/23  Yes Sreenath, Sudheer B, MD  insulin  aspart (NOVOLOG ) 100 UNIT/ML FlexPen Inject 2 Units into the skin 3 (three) times daily with meals. If eating and Blood Glucose (BG) 80 or higher inject 2 units for meal coverage and add correction dose per scale. If not eating, correction dose only. BG <150= 0 unit; BG 150-200= 1 unit; BG 201-250= 3 unit; BG 251-300= 5 unit; BG 301-350= 7 unit; BG 351-400= 9 unit; BG >400= 11 unit and Call Primary Care. 10/12/23  Yes Verla Glaze, MD  insulin  glargine (LANTUS ) 100 UNIT/ML Solostar Pen Inject 10 Units into the skin daily. 10/12/23  Yes Wieting, Richard, MD  Insulin  Pen Needle (PEN NEEDLES) 31G X 6 MM MISC PEN NEEDLES 31G X 6 MM 05/31/17  Yes [provider]  levothyroxine  (SYNTHROID ) 75 MCG tablet TAKE 1 TABLET (75 MCG TOTAL) BY MOUTH DAILY AT 6 (SIX) AM. 06/20/23  Yes Ostwalt, [name], PA-C  midodrine  (PROAMATINE ) 2.5 MG tablet Take 1 tablet (2.5 mg total) by mouth 3 (three) times daily with meals as needed (systolic press less than 100). 05/26/23  Yes Wittenborn, Deborah, NP  Vitamin D , Ergocalciferol , (DRISDOL ) 1.25 MG (50000 UNIT) CAPS capsule Take  50,000 Units by mouth once a week. (Sunday) 08/08/23  Yes [provider]  Blood Pressure Monitoring (BLOOD PRESSURE CUFF) MISC Use as directed 05/26/23   Morey Ar, NP  Continuous Glucose Sensor (DEXCOM G7 SENSOR) MISC SMARTSIG:1 Each Every 10 Days    [provider]  ondansetron  (ZOFRAN -ODT) 4 MG disintegrating tablet Take 1 tablet (4 mg total) by mouth every 8 (eight) hours as needed for nausea or vomiting. Patient not taking: Reported on 10/31/2023 09/29/23   Kent Pear, NP    Physical Exam: Vitals:   10/31/23 0144 10/31/23 0530 10/31/23 0614 10/31/23 0730  BP: (!) 162/110 127/67  125/67  Pulse: (!) 143 (!) 104  (!) 116  Resp: 18 (!) 21  19  Temp: 98.8 F (37.1 C)  100.1 F (37.8 C)   TempSrc: Oral  Oral   SpO2:  100%  100%  Constitutional: NAD, calm, comfortable Vitals:   10/31/23 0144 10/31/23 0530 10/31/23 0614 10/31/23 0730  BP: (!) 162/110 127/67  125/67  Pulse: (!) 143 (!) 104  (!) 116  Resp: 18 (!) 21  19  Temp: 98.8 F (37.1 C)  100.1 F (37.8 C)   TempSrc: Oral  Oral   SpO2:  100%  100%   Eyes: PERRL, lids and conjunctivae normal ENMT: Mucous membranes are moist. Posterior pharynx clear of any exudate or lesions.Normal dentition.  Neck: normal, supple, no masses, no thyromegaly Respiratory: clear to auscultation bilaterally, no wheezing, no crackles. Normal respiratory effort. No accessory muscle use.  Cardiovascular: Regular rate and rhythm, no murmurs / rubs / gallops. No extremity edema. 2+ pedal pulses.  Abdomen: no tenderness, no masses palpated. No hepatosplenomegaly. Bowel sounds positive.  Musculoskeletal: no clubbing / cyanosis. No joint deformity upper and lower extremities. Good ROM, no contractures. Normal muscle tone.  Skin: no rashes, lesions, ulcers. No induration Neurologic: CN 2-12 grossly intact. Sensation intact, Strength 5/5 in all 4.  Psychiatric: Normal judgment and insight. Alert and oriented x 3. Normal mood.     Labs on Admission: I have personally reviewed following labs and imaging studies  CBC: Recent Labs  Lab 10/31/23 0300  WBC 12.4*  NEUTROABS 11.2*  HGB 8.4*  HCT 25.6*  MCV 91.8  PLT 125*   Basic Metabolic Panel: Recent Labs  Lab 10/31/23 0154  NA 141  K 4.9  CL 108  CO2 24  GLUCOSE 72  BUN 42*  CREATININE 1.65*  CALCIUM  8.7*   GFR: Estimated Creatinine Clearance: 36.9 mL/min (A) (by C-G formula based on SCr of 1.65 mg/dL (H)). Liver Function Tests: Recent Labs  Lab 10/31/23 0154  AST 23  ALT 12  ALKPHOS 48  BILITOT 0.6  PROT 7.3  ALBUMIN  4.0   No results for input(s): "LIPASE", "AMYLASE" in the last 168 hours. No results for input(s): "AMMONIA" in the last 168 hours. Coagulation Profile: Recent Labs  Lab 10/31/23 0222  INR 1.1   Cardiac Enzymes: No results for input(s): "CKTOTAL", "CKMB", "CKMBINDEX", "TROPONINI" in the last 168 hours. BNP (last 3 results) No results for input(s): "PROBNP" in the last 8760 hours. HbA1C: No results for input(s): "HGBA1C" in the last 72 hours. CBG: Recent Labs  Lab 10/31/23 0523 10/31/23 0618 10/31/23 0656 10/31/23 0805 10/31/23 0918  GLUCAP 75 57* 198* 176* 204*   Lipid Profile: No results for input(s): "CHOL", "HDL", "LDLCALC", "TRIG", "CHOLHDL", "LDLDIRECT" in the last 72 hours. Thyroid  Function Tests: No results for input(s): "TSH", "T4TOTAL", "FREET4", "T3FREE", "THYROIDAB" in the last 72 hours. Anemia Panel: No results for input(s): "VITAMINB12", "FOLATE", "FERRITIN", "TIBC", "IRON ", "RETICCTPCT" in the last 72 hours. Urine analysis:    Component Value Date/Time   COLORURINE STRAW (A) 10/31/2023 0434   APPEARANCEUR CLEAR (A) 10/31/2023 0434   APPEARANCEUR Clear 09/18/2020 1153   LABSPEC 1.023 10/31/2023 0434   LABSPEC 1.026 03/01/2013 1844   PHURINE 5.0 10/31/2023 0434   GLUCOSEU 50 (A) 10/31/2023 0434   GLUCOSEU >=500 03/01/2013 1844   HGBUR MODERATE (A) 10/31/2023 0434   BILIRUBINUR NEGATIVE  10/31/2023 0434   BILIRUBINUR neg 10/23/2020 0953   BILIRUBINUR Negative 09/18/2020 1153   BILIRUBINUR Negative 03/01/2013 1844   KETONESUR NEGATIVE 10/31/2023 0434   PROTEINUR 100 (A) 10/31/2023 0434   UROBILINOGEN 0.2 10/23/2020 0953   NITRITE NEGATIVE 10/31/2023 0434   LEUKOCYTESUR SMALL (A) 10/31/2023 0434   LEUKOCYTESUR Negative 03/01/2013 1844    Radiological Exams on  Admission: CT ABDOMEN PELVIS W CONTRAST Result Date: 10/31/2023 CLINICAL DATA:  Bowel obstruction suspected EXAM: CT ABDOMEN AND PELVIS WITH CONTRAST TECHNIQUE: Multidetector CT imaging of the abdomen and pelvis was performed using the standard protocol following bolus administration of intravenous contrast. RADIATION DOSE REDUCTION: This exam was performed according to the departmental dose-optimization program which includes automated exposure control, adjustment of the mA and/or kV according to patient size and/or use of iterative reconstruction technique. CONTRAST:  75mL OMNIPAQUE  IOHEXOL  300 MG/ML  SOLN COMPARISON:  07/23/2023 FINDINGS: Lower Chest: Normal. Hepatobiliary: Normal hepatic contours. No intra- or extrahepatic biliary dilatation. The gallbladder is normal. Pancreas: Normal pancreas. No ductal dilatation or peripancreatic fluid collection. Spleen: Normal. Adrenals/Urinary Tract: The adrenal glands are normal. No hydronephrosis, nephroureterolithiasis or solid renal mass. Mild wall thickening, possibly exaggerated by underdistention. Stomach/Bowel: There is no hiatal hernia. Normal duodenal course and caliber. No small bowel dilatation or inflammation. No focal colonic abnormality. The appendix is not visualized. No right lower quadrant inflammation or free fluid. Vascular/Lymphatic: Normal course and caliber of the major abdominal vessels. No abdominal or pelvic lymphadenopathy. Reproductive: Normal uterus. No adnexal mass. Other: None. Musculoskeletal: No bony spinal canal stenosis or focal osseous abnormality.  IMPRESSION: 1. No acute abnormality of the abdomen or pelvis. 2. Mild wall thickening of the urinary bladder, possibly exaggerated by underdistention. Correlate with urinalysis to exclude cystitis. Electronically Signed   By: Juanetta Nordmann M.D.   On: 10/31/2023 04:24   DG Chest Port 1 View Result Date: 10/31/2023 CLINICAL DATA:  Sepsis EXAM: PORTABLE CHEST 1 VIEW COMPARISON:  05/26/2023 FINDINGS: The heart size and mediastinal contours are within normal limits. Both lungs are clear. The visualized skeletal structures are unremarkable. IMPRESSION: No active disease. Electronically Signed   By: Juanetta Nordmann M.D.   On: 10/31/2023 03:18    EKG: Independently reviewed. See above  Assessment/Plan  Sepsis presumed due to GU source - on admission patient tachycardic, + lactic , and elevated wbc with possible GU source - UA noted RBC , bacteria no wbc  - possible early UTI  - will continue CTX for now de-escalate based on cultures  - Urine culture pending  -f/u on blood cultures - trend inflammatory markers and lactic acid -patient started on CTX  -patient UA not impressive and patient w/o GU symptoms  -CT abd/pelvis no acute findings, ? Mild cystitis  -will f/u with GI panel / and full respiratory panel  -for now will broad antibiotics insetting of patient hx of adrenal insufficiency and no concrete source of infection found.   Refractory Hypoglycemia, in setting DM Type 1 and adrenal insufficiency -requiring D10  in ED s/p treatment in filed with glucagon/ glucose gel  -patient s/p D10 now with fs in 204  -will d/c D10 and continue to monitor fs  -continue with ADA diet  -resume lantus  per Diabetic RN rec at  5 units    Adrenal insufficiency with concern exacerbation in setting of infection nos -will continue on solucortef  stress dosing  - will resume home steroid dosing  as able  Anemia -at baseline  -continue to monitor  Chronic combined CHF  -last EF 55% -appears well  compensated  -appears mildly hypovolemic -s/p ivfs in ED  -will continue with gentle ivfs  -strict I/o   [id number] -at baseline  -avoid nephrotoxic medications   Gastroparesis -no acute flare noted    Hypertension -stable   Hypothyroidism -resume synthroid     DVT prophylaxis: heparin  Code Status: full/ as  discussed per patient wishes in event of cardiac arrest  Family Communication: none at bedside Disposition Plan: patient  expected to be admitted greater than 2 midnights  Consults called: n/a Admission status: med tele   Sabas Cradle MD [facility]   If 7PM-7AM, please contact night-coverage [email or web address] Password [email or web address]  10/31/2023, 9:50 AM

## 2023-10-31 NOTE — Progress Notes (Signed)
 PHARMACY - PHYSICIAN COMMUNICATION CRITICAL VALUE ALERT - BLOOD CULTURE IDENTIFICATION (BCID)  Ashley Camacho is an 33 y.o. female who presented to [facility] on 10/31/2023 with a chief complaint of sepsis  Assessment: 1/4 bottles GPC. BCID did not detect an organism. Suspected source is GU at this time.   Name of physician (or Provider) Contacted: Dr. Vallarie Gauze  Current antibiotics: Ceftriaxone  & vancomycin   Changes to prescribed antibiotics recommended:  Patient is on recommended antibiotics - No changes needed  Results for orders placed or performed during the hospital encounter of 10/31/23  Blood Culture ID Panel (Reflexed) (Collected: 10/31/2023  2:00 AM)  Result Value Ref Range   Enterococcus faecalis NOT DETECTED NOT DETECTED   Enterococcus Faecium NOT DETECTED NOT DETECTED   Listeria monocytogenes NOT DETECTED NOT DETECTED   Staphylococcus species NOT DETECTED NOT DETECTED   Staphylococcus aureus (BCID) NOT DETECTED NOT DETECTED   Staphylococcus epidermidis NOT DETECTED NOT DETECTED   Staphylococcus lugdunensis NOT DETECTED NOT DETECTED   Streptococcus species NOT DETECTED NOT DETECTED   Streptococcus agalactiae NOT DETECTED NOT DETECTED   Streptococcus pneumoniae NOT DETECTED NOT DETECTED   Streptococcus pyogenes NOT DETECTED NOT DETECTED   A.calcoaceticus-baumannii NOT DETECTED NOT DETECTED   Bacteroides fragilis NOT DETECTED NOT DETECTED   Enterobacterales NOT DETECTED NOT DETECTED   Enterobacter cloacae complex NOT DETECTED NOT DETECTED   Escherichia coli NOT DETECTED NOT DETECTED   Klebsiella aerogenes NOT DETECTED NOT DETECTED   Klebsiella oxytoca NOT DETECTED NOT DETECTED   Klebsiella pneumoniae NOT DETECTED NOT DETECTED   Proteus species NOT DETECTED NOT DETECTED   Salmonella species NOT DETECTED NOT DETECTED   Serratia marcescens NOT DETECTED NOT DETECTED   Haemophilus influenzae NOT DETECTED NOT DETECTED   Neisseria meningitidis NOT DETECTED NOT DETECTED    Pseudomonas aeruginosa NOT DETECTED NOT DETECTED   Stenotrophomonas maltophilia NOT DETECTED NOT DETECTED   Candida albicans NOT DETECTED NOT DETECTED   Candida auris NOT DETECTED NOT DETECTED   Candida glabrata NOT DETECTED NOT DETECTED   Candida krusei NOT DETECTED NOT DETECTED   Candida parapsilosis NOT DETECTED NOT DETECTED   Candida tropicalis NOT DETECTED NOT DETECTED   Cryptococcus neoformans/gattii NOT DETECTED NOT DETECTED    [name] 10/31/2023  9:08 PM

## 2023-10-31 NOTE — ED Provider Notes (Signed)
 [facility] Provider Note    Event Date/Time   First MD Initiated Contact with Patient 10/31/23 0140     (approximate)   History   Low blood sugar  Caveat: acute hypoglycemia/life threat  HPI  Ashley Camacho is a 33 y.o. female reports they are called out for concerns of low blood sugar the patient was also found to be by EMS report hypotensive and febrile 103   Patient reports rather abrupt onset of vomiting.  Been taking her medication last reports use of insulin  short acting earlier evening, and reports her long-acting history otherwise is in the morning.  Patient feels fatigued.  Physical Exam   Triage Vital Signs: ED Triage Vitals [10/31/23 0144]  Encounter Vitals Group     BP (!) 162/110     Systolic BP Percentile      Diastolic BP Percentile      Pulse Rate (!) 143     Resp 18     Temp 98.8 F (37.1 C)     Temp Source Oral     SpO2      Weight      Height      Head Circumference      Peak Flow      Pain Score 0     Pain Loc      Pain Education      Exclude from Growth Chart     Most recent vital signs: Vitals:   10/31/23 0614 10/31/23 0730  BP:  125/67  Pulse:  (!) 116  Resp:  19  Temp: 100.1 F (37.8 C)   SpO2:  100%     General: Awake, appears somewhat pale in complexion.  Active emesis nonbloody CV:  Good peripheral perfusion.  Strong pulses tachycardic Resp:  Normal effort.  Abd:  No distention.  Soft not obviously tender. Active emesis. Some emesis around mouth. Protecting airway.  Other:  Clear lungs   ED Results / Procedures / Treatments   Labs (all labs ordered are listed, but only abnormal results are displayed) Labs Reviewed  BLOOD GAS, VENOUS - Abnormal; Notable for the following components:      Result Value   pO2, Ven 51 (*)    Bicarbonate 29.7 (*)    Acid-Base Excess 3.7 (*)    All other components within normal limits  COMPREHENSIVE METABOLIC PANEL WITH GFR - Abnormal; Notable for the  following components:   BUN 42 (*)    Creatinine, Ser 1.65 (*)    Calcium  8.7 (*)    GFR, Estimated 42 (*)    All other components within normal limits  LACTIC ACID, PLASMA - Abnormal; Notable for the following components:   Lactic Acid, Venous 3.1 (*)    All other components within normal limits  URINALYSIS, W/ REFLEX TO CULTURE (INFECTION SUSPECTED) - Abnormal; Notable for the following components:   Color, Urine STRAW (*)    APPearance CLEAR (*)    Glucose, UA 50 (*)    Hgb urine dipstick MODERATE (*)    Protein, ur 100 (*)    Leukocytes,Ua SMALL (*)    Bacteria, UA RARE (*)    All other components within normal limits  CBC WITH DIFFERENTIAL/PLATELET - Abnormal; Notable for the following components:   WBC 12.4 (*)    RBC 2.79 (*)    Hemoglobin 8.4 (*)    HCT 25.6 (*)    Platelets 125 (*)    Neutro Abs 11.2 (*)  Lymphs Abs 0.5 (*)    All other components within normal limits  CBG MONITORING, ED - Abnormal; Notable for the following components:   Glucose-Capillary 53 (*)    All other components within normal limits  CBG MONITORING, ED - Abnormal; Notable for the following components:   Glucose-Capillary 181 (*)    All other components within normal limits  CBG MONITORING, ED - Abnormal; Notable for the following components:   Glucose-Capillary 154 (*)    All other components within normal limits  CBG MONITORING, ED - Abnormal; Notable for the following components:   Glucose-Capillary 57 (*)    All other components within normal limits  CBG MONITORING, ED - Abnormal; Notable for the following components:   Glucose-Capillary 198 (*)    All other components within normal limits  CBG MONITORING, ED - Abnormal; Notable for the following components:   Glucose-Capillary 176 (*)    All other components within normal limits  CBG MONITORING, ED - Abnormal; Notable for the following components:   Glucose-Capillary 204 (*)    All other components within normal limits  CULTURE,  BLOOD (ROUTINE X 2)  CULTURE, BLOOD (ROUTINE X 2)  RESP PANEL BY RT-PCR (RSV, FLU A&B, COVID)  RVPGX2  BETA-HYDROXYBUTYRIC ACID  LACTIC ACID, PLASMA  PROTIME-INR  HCG, QUANTITATIVE, PREGNANCY  CBC WITH DIFFERENTIAL/PLATELET  CBG MONITORING, ED  CBG MONITORING, ED  CBG MONITORING, ED  CBG MONITORING, ED  CBG MONITORING, ED  CBG MONITORING, ED  CBG MONITORING, ED  CBG MONITORING, ED  CBG MONITORING, ED  CBG MONITORING, ED  CBG MONITORING, ED  CBG MONITORING, ED  CBG MONITORING, ED  CBG MONITORING, ED  CBG MONITORING, ED  CBG MONITORING, ED     EKG  And interpreted by me at 210 heart rate 140 QRS 80 QTc 500, some artifact favor sinus tachycardia.  Mild nonspecific T wave abnormality.  No evidence of acute ischemia obvious   RADIOLOGY  CT ABDOMEN PELVIS W CONTRAST Result Date: 10/31/2023 CLINICAL DATA:  Bowel obstruction suspected EXAM: CT ABDOMEN AND PELVIS WITH CONTRAST TECHNIQUE: Multidetector CT imaging of the abdomen and pelvis was performed using the standard protocol following bolus administration of intravenous contrast. RADIATION DOSE REDUCTION: This exam was performed according to the departmental dose-optimization program which includes automated exposure control, adjustment of the mA and/or kV according to patient size and/or use of iterative reconstruction technique. CONTRAST:  75mL OMNIPAQUE  IOHEXOL  300 MG/ML  SOLN COMPARISON:  07/23/2023 FINDINGS: Lower Chest: Normal. Hepatobiliary: Normal hepatic contours. No intra- or extrahepatic biliary dilatation. The gallbladder is normal. Pancreas: Normal pancreas. No ductal dilatation or peripancreatic fluid collection. Spleen: Normal. Adrenals/Urinary Tract: The adrenal glands are normal. No hydronephrosis, nephroureterolithiasis or solid renal mass. Mild wall thickening, possibly exaggerated by underdistention. Stomach/Bowel: There is no hiatal hernia. Normal duodenal course and caliber. No small bowel dilatation or  inflammation. No focal colonic abnormality. The appendix is not visualized. No right lower quadrant inflammation or free fluid. Vascular/Lymphatic: Normal course and caliber of the major abdominal vessels. No abdominal or pelvic lymphadenopathy. Reproductive: Normal uterus. No adnexal mass. Other: None. Musculoskeletal: No bony spinal canal stenosis or focal osseous abnormality. IMPRESSION: 1. No acute abnormality of the abdomen or pelvis. 2. Mild wall thickening of the urinary bladder, possibly exaggerated by underdistention. Correlate with urinalysis to exclude cystitis. Electronically Signed   By: Juanetta Nordmann M.D.   On: 10/31/2023 04:24   DG Chest Port 1 View Result Date: 10/31/2023 CLINICAL DATA:  Sepsis EXAM: PORTABLE CHEST  1 VIEW COMPARISON:  05/26/2023 FINDINGS: The heart size and mediastinal contours are within normal limits. Both lungs are clear. The visualized skeletal structures are unremarkable. IMPRESSION: No active disease. Electronically Signed   By: Juanetta Nordmann M.D.   On: 10/31/2023 03:18      PROCEDURES:  Critical Care performed: No   Procedures   MEDICATIONS ORDERED IN ED: Medications  enoxaparin  (LOVENOX ) injection 40 mg (40 mg Subcutaneous Given 10/31/23 0805)  cefTRIAXone  (ROCEPHIN ) 2 g in sodium chloride  0.9 % 100 mL IVPB (has no administration in time range)  acetaminophen  (TYLENOL ) tablet 650 mg (650 mg Oral Given 10/31/23 0654)    Or  acetaminophen  (TYLENOL ) suppository 650 mg ( Rectal See Alternative 10/31/23 0654)  HYDROcodone -acetaminophen  (NORCO/VICODIN) 5-325 MG per tablet 1-2 tablet (has no administration in time range)  ondansetron  (ZOFRAN ) tablet 4 mg (has no administration in time range)    Or  ondansetron  (ZOFRAN ) injection 4 mg (has no administration in time range)  hydrocortisone  sodium succinate  (SOLU-CORTEF ) 100 MG injection 100 mg (has no administration in time range)  lactated ringers  infusion ( Intravenous Rate/Dose Verify 10/31/23 0920)  dextrose  50  % solution 25 mL (has no administration in time range)  dextrose  10 % infusion (0 mLs Intravenous Stopped 10/31/23 0433)  hydrocortisone  sodium succinate  (SOLU-CORTEF ) 100 MG injection 100 mg (100 mg Intravenous Given 10/31/23 0236)  sodium chloride  0.9 % bolus 500 mL (0 mLs Intravenous Stopped 10/31/23 0433)  sodium chloride  0.9 % bolus 500 mL (0 mLs Intravenous Stopped 10/31/23 0433)  iohexol  (OMNIPAQUE ) 300 MG/ML solution 75 mL (75 mLs Intravenous Contrast Given 10/31/23 0401)  cefTRIAXone  (ROCEPHIN ) 1 g in sodium chloride  0.9 % 100 mL IVPB (0 g Intravenous Stopped 10/31/23 0603)  cefTRIAXone  (ROCEPHIN ) 1 g in sodium chloride  0.9 % 100 mL IVPB (0 g Intravenous Stopped 10/31/23 0642)     IMPRESSION / MDM / ASSESSMENT AND PLAN / ED COURSE  I reviewed the triage vital signs and the nursing notes.                              Differential diagnosis includes, but is not limited to, hypoglycemia, infectious cause given reported fever by EMS, acute gastroenteritis, intra-abdominal causation especially given the patient's report of vomiting, diarrhea, and fever reported by EMS.  Patient is currently hypoglycemic appears pale, but in no acute extremis but obviously had earlier vomiting.  She has a history of being extremely frail diabetic.  She is compliant with her insulin  therapy.  Hypoglycemia in the setting of hypovolemia, fever nausea vomiting..  No obvious pulmonary symptoms patient alertness and reassessment.  Much improved after treatment of hypoglycemia.  Antiemetic etc.  She is feeling improved  Discussed with the patient she is understanding of need for admission.  Patient's presentation is most consistent with acute presentation with potential threat to life or bodily function.   The patient is on the cardiac monitor to evaluate for evidence of arrhythmia and/or significant heart rate changes.   Clinical Course as of 10/31/23 1001  Mon Oct 31, 2023  1610 Labs to this point interpreted as mild  acute leukocytosis with chronic anemia.  No clear supporting evidence of acute DKA.  Hypoglycemia is improving.  Patient pending CT to evaluate for intra-abdominal pathology given associated nausea vomiting and EMS reported fever though no fever noted here.  Additionally awaiting urinalysis.  She is resting more comfortably now.  Anticipate need for admission given the  degree of symptomatologies, brittle diabetic, and further need to exclude cause at this time.  Again awaiting additional studies. [MQ]  8469 Glucose-Capillary: 75 [HD]    Clinical Course User Index [HD] Lanetta Pion, MD [MQ] Iver Marker, MD  ----------------------------------------- 5:23 AM on 10/31/2023 ----------------------------------------- Admission request discussed with Dr. Vallarie Gauze.  Hospitalist to provide admission  Code sepsis initiated. No hypotension or lactic > 4. No evidence of septic shock noted at this time.  FINAL CLINICAL IMPRESSION(S) / ED DIAGNOSES   Final diagnoses:  Sepsis, due to unspecified organism, unspecified whether acute organ dysfunction present [facility])  Urinary tract infection, acute  Hypoglycemia     Rx / DC Orders   ED Discharge Orders     None        Note:  This document was prepared using Dragon voice recognition software and may include unintentional dictation errors.   Iver Marker, MD 10/31/23 1001

## 2023-10-31 NOTE — Progress Notes (Signed)
 Pharmacy Antibiotic Note  Ashley Camacho is a 33 y.o. female w/ PMH of  type 1 diabetes mellitus, diabetic gastroparesis, essential hypertension, migraine, adrenal insufficiency and hypothyroidism admitted on 10/31/2023 with sepsis due to GU source.  Pharmacy has been consulted for vancomycin  dosing.  Plan: vancomycin  1000 mg IV x 1 then 750 mg every 24 hours Goal AUC 400-550. Expected AUC: 411.4 SCr used: 1.07  Height: 5\' 1"  (154.9 cm) IBW/kg (Calculated) : 47.8  Temp (24hrs), Avg:98.9 F (37.2 C), Min:98.2 F (36.8 C), Max:100.1 F (37.8 C)  Recent Labs  Lab 10/31/23 0154 10/31/23 0300 10/31/23 0455 10/31/23 1236 10/31/23 1330  WBC  --  12.4*  --   --  11.0*  CREATININE 1.65*  --   --  1.07*  --   LATICACIDVEN 3.1*  --  1.4 6.8*  --     Estimated Creatinine Clearance: 57 mL/min (A) (by C-G formula based on SCr of 1.07 mg/dL (H)).    No Known Allergies  Antimicrobials this admission: 06/09 vancomycin  >>  06/09 ceftriaxone  >>   Microbiology results: 06/09 BCx: pending  Thank you for allowing pharmacy to be a part of this patient's care.  Adalberto Acton 10/31/2023 4:23 PM

## 2023-10-31 NOTE — ED Triage Notes (Signed)
 Pt arrived via EMS from home for hypoglycemia. Pt took some insulin  after dinner around 6p. Pt's sugar was 38 when EMS arrived on scene. Pt is A&Ox4.  EMS gave 1mg  glucagon IM Half a tube of gel

## 2023-11-01 DIAGNOSIS — N390 Urinary tract infection, site not specified: Secondary | ICD-10-CM | POA: Diagnosis not present

## 2023-11-01 DIAGNOSIS — A419 Sepsis, unspecified organism: Secondary | ICD-10-CM | POA: Diagnosis not present

## 2023-11-01 LAB — COMPREHENSIVE METABOLIC PANEL WITH GFR
ALT: 12 U/L (ref 0–44)
AST: 21 U/L (ref 15–41)
Albumin: 3.3 g/dL — ABNORMAL LOW (ref 3.5–5.0)
Alkaline Phosphatase: 36 U/L — ABNORMAL LOW (ref 38–126)
Anion gap: 11 (ref 5–15)
BUN: 37 mg/dL — ABNORMAL HIGH (ref 6–20)
CO2: 19 mmol/L — ABNORMAL LOW (ref 22–32)
Calcium: 7.7 mg/dL — ABNORMAL LOW (ref 8.9–10.3)
Chloride: 114 mmol/L — ABNORMAL HIGH (ref 98–111)
Creatinine, Ser: 1.54 mg/dL — ABNORMAL HIGH (ref 0.44–1.00)
GFR, Estimated: 46 mL/min — ABNORMAL LOW (ref >60)
Glucose, Bld: 169 mg/dL — ABNORMAL HIGH (ref 70–99)
Potassium: 3.6 mmol/L (ref 3.5–5.1)
Sodium: 144 mmol/L (ref 135–145)
Total Bilirubin: 0.6 mg/dL (ref 0.0–1.2)
Total Protein: 6.2 g/dL — ABNORMAL LOW (ref 6.5–8.1)

## 2023-11-01 LAB — C DIFFICILE QUICK SCREEN W PCR REFLEX
C Diff antigen: NEGATIVE
C Diff interpretation: NOT DETECTED
C Diff toxin: NEGATIVE

## 2023-11-01 LAB — CBC
HCT: 27.1 % — ABNORMAL LOW (ref 36.0–46.0)
Hemoglobin: 8.7 g/dL — ABNORMAL LOW (ref 12.0–15.0)
MCH: 29.8 pg (ref 26.0–34.0)
MCHC: 32.1 g/dL (ref 30.0–36.0)
MCV: 92.8 fL (ref 80.0–100.0)
Platelets: 140 10*3/uL — ABNORMAL LOW (ref 150–400)
RBC: 2.92 MIL/uL — ABNORMAL LOW (ref 3.87–5.11)
RDW: 13.9 % (ref 11.5–15.5)
WBC: 6.5 10*3/uL (ref 4.0–10.5)
nRBC: 0 % (ref 0.0–0.2)

## 2023-11-01 LAB — GASTROINTESTINAL PANEL BY PCR, STOOL (REPLACES STOOL CULTURE)

## 2023-11-01 LAB — GLUCOSE, CAPILLARY
Glucose-Capillary: 58 mg/dL — ABNORMAL LOW (ref 70–99)
Glucose-Capillary: 68 mg/dL — ABNORMAL LOW (ref 70–99)
Glucose-Capillary: 100 mg/dL — ABNORMAL HIGH (ref 70–99)
Glucose-Capillary: 279 mg/dL — ABNORMAL HIGH (ref 70–99)
Glucose-Capillary: 225 mg/dL — ABNORMAL HIGH (ref 70–99)
Glucose-Capillary: 157 mg/dL — ABNORMAL HIGH (ref 70–99)
Glucose-Capillary: 208 mg/dL — ABNORMAL HIGH (ref 70–99)

## 2023-11-01 MED ORDER — SODIUM CHLORIDE 0.9 % IV SOLN: INTRAVENOUS | Status: AC

## 2023-11-01 MED ORDER — INSULIN GLARGINE-YFGN 100 UNIT/ML ~~LOC~~ SOLN
5.0000 [IU] | Freq: Every day | SUBCUTANEOUS | Status: DC
  Administered 2023-11-01 – 2023-11-02 (×2): 5 [IU] via SUBCUTANEOUS
  Filled 2023-11-01 (×2): qty 0.05

## 2023-11-01 MED ORDER — DIVALPROEX SODIUM 250 MG PO DR TAB
250.0000 mg | DELAYED_RELEASE_TABLET | Freq: Every day | ORAL | Status: DC
  Administered 2023-11-01 – 2023-11-04 (×4): 250 mg via ORAL
  Filled 2023-11-01 (×4): qty 1

## 2023-11-01 MED ORDER — HYDROCORTISONE SOD SUC (PF) 100 MG IJ SOLR
100.0000 mg | Freq: Three times a day (TID) | INTRAMUSCULAR | Status: DC
  Administered 2023-11-01 – 2023-11-02 (×5): 100 mg via INTRAVENOUS
  Filled 2023-11-01 (×5): qty 2

## 2023-11-01 MED ORDER — CALCIUM CARBONATE 1250 (500 CA) MG PO TABS
1.0000 | ORAL_TABLET | Freq: Two times a day (BID) | ORAL | Status: DC
  Administered 2023-11-01 – 2023-11-04 (×6): 1250 mg via ORAL
  Filled 2023-11-01 (×7): qty 1

## 2023-11-01 MED ORDER — VANCOMYCIN HCL IN DEXTROSE 1-5 GM/200ML-% IV SOLN
1000.0000 mg | INTRAVENOUS | Status: DC
  Administered 2023-11-02: 1000 mg via INTRAVENOUS
  Filled 2023-11-01: qty 200

## 2023-11-01 NOTE — Inpatient Diabetes Management (Signed)
 Inpatient Diabetes Program Recommendations  AACE/ADA: New Consensus Statement on Inpatient Glycemic Control (2015)  Target Ranges:  Prepandial:   less than 140 mg/dL      Peak postprandial:   less than 180 mg/dL (1-2 hours)      Critically ill patients:  140 - 180 mg/dL   Lab Results  Component Value Date   GLUCAP 279 (H) 11/01/2023   HGBA1C 8.0 (H) 10/09/2023    Review of Glycemic Control  Latest Reference Range & Units 10/31/23 19:29 10/31/23 23:28 11/01/23 03:47 11/01/23 04:17 11/01/23 04:53 11/01/23 08:13  Glucose-Capillary 70 - 99 mg/dL 027 (H) 253 (H) 68 (L) 58 (L) 100 (H) 279 (H)  (H): Data is abnormally high (L): Data is abnormally low  Diabetes history: DM1 Outpatient Diabetes medications: Lantus  10 every day, Novolog  0-11 TID and 2 units TID, Cortef  20 mg QAM, 10 mg QPM, G7  Current orders for Inpatient glycemic control: Semglee  10 units every day, Novolog  0-6 units Q4H, Solu-Cortef  100 mg Q8H  Inpatient Diabetes Program Recommendations:    Hypoglycemia this morning of 58 mg/dL.   Please decrease Semglee  to 5 units at bedtime.  Thank you, Hays Lipschutz, MSN, CDCES Diabetes Coordinator Inpatient Diabetes Program [phone] (team pager from 8a-5p)

## 2023-11-01 NOTE — Progress Notes (Signed)
 Dr. Meyer Ada made aware that the patient is still having up to 4 or more episodes of diarrhea every hour.

## 2023-11-01 NOTE — Progress Notes (Signed)
 Progress Note   Patient: Ashley Camacho ZOX:[id number] DOB: 1990-09-16 DOA: 10/31/2023     1 DOS: the patient was seen and examined on 11/01/2023   Brief hospital course:  TRACY GERKEN is a 33 y.o. female with medical history significant of  Adrenal insufficiency, Anemia,Chronic combined CHF, CKD3b, Gastroparesis, hypertension, hypothyroidism, DM Type 1 who presents to ED BIB EMS due to severe hypoglycemia at home. Per patient she took insulin  at 6pm after dinner and noted  hypoglycemia there after.  She states she also note episodes of hypoglycemia the day before, however these episodes resolved with treatment. On arrival , EMS noted patient glucose was 38.  Patient was treated with 1mg  of glucagon IM as well as 1/2 tube of glucose gel. Of note patient has recent interim history of admission  5/18-5/21 with dx of DKA.  Patient currently denies any associated fever /chills/ chest pain /sob /dysuria  but has noted increase in baseline diarrhea as well as episode of n/v. She states that she did have sick contact with GI illness a few days prior. She noted no chills or fever but states she was told she has fever by EMS.   ED Course:  Vitals: afeb, bp 162/110, hr 143, rr 18 sat  Vbg:7.39/co2 49 Na 141, K 4.9, cl 108, bicarb 24, cr 1.65-1.38 at baseline  Lact 3.1 reeat 1.4 Wbc 12.4, hgb 8.4 close to baseline ,plt 125 pmn 90 Cxr:NAD CTAB/pelvis: IMPRESSION: 1. No acute abnormality of the abdomen or pelvis. 2. Mild wall thickening of the urinary bladder, possibly exaggerated by underdistention. Correlate with urinalysis to exclude cystitis. UA :small LE, rare bacteria Fs on arrival 53, >154,> 57>176>204 RVP: neg EKG: Sinus tachycardia  TX: NS 500cc , solucortef 100mg  , dextrose  10%         Assessment and Plan:  Sepsis presumed due to GU source - on admission patient tachycardic, + lactic , and elevated wbc with possible GU source - UA noted RBC , bacteria no wbc.  Urine culture  pending - 1 set of blood cultures yield gram-positive cocci, ID of organism pending -Continue empiric antibiotic therapy with vancomycin  and Rocephin  which patient is on for presumed UTI/mild cystitis continues - Continue stress dose steroids over the next 24 hours    Lactic acidosis Presumed to be secondary to an infectious process but may also be related to chronic bicarbonate loss due to diarrhea Continue hydration    Refractory Hypoglycemia, in setting DM Type 1 and adrenal insufficiency -requiring D10  in ED s/p treatment in filed with glucagon/ glucose gel  - Patient with labile blood sugars -Has had episodes of hypoglycemia -Decrease Levemir  to 5 units     Adrenal insufficiency with concern exacerbation in setting of infection nos - Continue stress dose steroids   Anemia of chronic disease -at baseline  -continue to monitor   Chronic diastolic CHF  -last EF 55% -appears well compensated  -appears mildly hypovolemic - Monitor respiratory status closely   Diabetes mellitus with complications of stage III CKD3b and gastroparesis -at baseline  -avoid nephrotoxic medications       Hypertension -stable    Hypothyroidism -resume synthroid            Subjective: Feels better.  Diarrhea persists  Physical Exam: Vitals:   10/31/23 2329 11/01/23 0348 11/01/23 0817 11/01/23 1259  BP: 115/66 109/71 (!) 158/94 (!) 147/76  Pulse: 82 81 86 73  Resp: 20 20    Temp: 98.2 F (36.8 C) 98.1  F (36.7 C) 98.1 F (36.7 C) (!) 97.3 F (36.3 C)  TempSrc: Oral Oral  Oral  SpO2: 100% 100% 100% 100%  Height:       Eyes: PERRL, lids and conjunctivae normal ENMT: Mucous membranes are moist. Posterior pharynx clear of any exudate or lesions.Normal dentition.  Neck: normal, supple, no masses, no thyromegaly Respiratory: clear to auscultation bilaterally, no wheezing, no crackles. Normal respiratory effort. No accessory muscle use.  Cardiovascular: Regular rate and rhythm, no  murmurs / rubs / gallops. No extremity edema. 2+ pedal pulses.  Abdomen: no tenderness, no masses palpated. No hepatosplenomegaly. Bowel sounds positive.  Musculoskeletal: no clubbing / cyanosis. No joint deformity upper and lower extremities. Good ROM, no contractures. Normal muscle tone.  Skin: no rashes, lesions, ulcers. No induration Neurologic: CN 2-12 grossly intact. Sensation intact, Strength 5/5 in all 4.  Psychiatric: Normal judgment and insight. Alert and oriented x 3. Normal mood.    Data Reviewed: Creatinine 1.54, BUN 37, hemoglobin 8.7 Labs reviewed  Family Communication: Plan of care discussed with patient at the bedside.  She verbalizes understanding and agrees with the plan.  Disposition: Status is: Inpatient Remains inpatient appropriate because: Optimize glycemic control  Planned Discharge Destination: Home    Time spent: 40 minutes  Author: Read Camel, MD 11/01/2023 1:56 PM  For on call review [email or web address].

## 2023-11-01 NOTE — Plan of Care (Signed)
  Problem: Fluid Volume: Goal: Hemodynamic stability will improve Outcome: Progressing   Problem: Clinical Measurements: Goal: Diagnostic test results will improve Outcome: Progressing   Problem: Education: Goal: Ability to describe self-care measures that may prevent or decrease complications (Diabetes Survival Skills Education) will improve Outcome: Progressing   Problem: Coping: Goal: Ability to adjust to condition or change in health will improve Outcome: Progressing

## 2023-11-01 NOTE — Progress Notes (Signed)
 Hypoglycemic Event  CBG: 53  Treatment: 8 oz oJ, Graham crackers, peanut butter  Symptoms: None  Follow-up CBG: Time:448 CBG Result:100  Possible Reasons for Event: medication  Comments/MD notified:yes. Hazel Vallarie Gauze     [facility]

## 2023-11-01 NOTE — Progress Notes (Signed)
 Hypoglycemic Event  CBG: 68  Treatment: 8 oz juice/soda  Symptoms: None Follow-up CBG: Time:0415 CBG Result:53  Possible Reasons for Event: Medication regimen:    Comments/MD notified: Yes. No new orders   [facility]

## 2023-11-01 NOTE — Progress Notes (Signed)
 Pharmacy Antibiotic Note  Ashley Camacho is a 33 y.o. female w/ PMH of  type 1 diabetes mellitus, diabetic gastroparesis, essential hypertension, migraine, adrenal insufficiency and hypothyroidism admitted on 10/31/2023 with sepsis due to GU source.  Pharmacy has been consulted for vancomycin  dosing.  Scr changed from 1.04 to 1.54 Procal 53.01 Lactic acid - WNL WBC 12.4-->>6.5  Plan: Change Vancomycin  dose to 1000mg  IV q 36hrs 2ry to change in renal function Goal AUC 400-550. Expected AUC: 507.3 Expected Cmin:10.4 Vd coefficient: 0.72 SCr used: 1.54 2. Follow up cultures and clinical progress to adjust therapy as needed.  Height: 5\' 1"  (154.9 cm) IBW/kg (Calculated) : 47.8  Temp (24hrs), Avg:98.2 F (36.8 C), Min:98.1 F (36.7 C), Max:98.4 F (36.9 C)  Recent Labs  Lab 10/31/23 0154 10/31/23 0300 10/31/23 0455 10/31/23 1236 10/31/23 1330 10/31/23 1632 10/31/23 1820 10/31/23 2021 11/01/23 0636  WBC  --  12.4*  --   --  11.0*  --   --   --  6.5  CREATININE 1.65*  --   --  1.07*  --   --   --   --  1.54*  LATICACIDVEN 3.1*  --  1.4 6.8*  --  1.5 1.2 1.0  --     Estimated Creatinine Clearance: 39.6 mL/min (A) (by C-G formula based on SCr of 1.54 mg/dL (H)).    No Known Allergies  Antimicrobials this admission: 06/09 vancomycin  >>  06/09 ceftriaxone  >>   Microbiology results: 06/09 BCx: 1/4 bottle : [facility]  Sirinity Outland Rodriguez-Guzman PharmD, BCPS 11/01/2023 9:30 AM

## 2023-11-02 ENCOUNTER — Inpatient Hospital Stay (HOSPITAL_COMMUNITY)
Admit: 2023-11-02 | Discharge: 2023-11-02 | Disposition: A | Discharge status: Home / Self Care | Attending: Infectious Diseases | Admitting: Infectious Diseases

## 2023-11-02 DIAGNOSIS — A0811 Acute gastroenteropathy due to Norwalk agent: Secondary | ICD-10-CM | POA: Diagnosis not present

## 2023-11-02 DIAGNOSIS — A419 Sepsis, unspecified organism: Secondary | ICD-10-CM | POA: Diagnosis not present

## 2023-11-02 DIAGNOSIS — R7881 Bacteremia: Secondary | ICD-10-CM

## 2023-11-02 DIAGNOSIS — N390 Urinary tract infection, site not specified: Secondary | ICD-10-CM | POA: Diagnosis not present

## 2023-11-02 LAB — GLUCOSE, CAPILLARY
Glucose-Capillary: 253 mg/dL — ABNORMAL HIGH (ref 70–99)
Glucose-Capillary: 116 mg/dL — ABNORMAL HIGH (ref 70–99)
Glucose-Capillary: 67 mg/dL — ABNORMAL LOW (ref 70–99)
Glucose-Capillary: 76 mg/dL (ref 70–99)
Glucose-Capillary: 119 mg/dL — ABNORMAL HIGH (ref 70–99)
Glucose-Capillary: 263 mg/dL — ABNORMAL HIGH (ref 70–99)
Glucose-Capillary: 275 mg/dL — ABNORMAL HIGH (ref 70–99)
Glucose-Capillary: 224 mg/dL — ABNORMAL HIGH (ref 70–99)
Glucose-Capillary: 267 mg/dL — ABNORMAL HIGH (ref 70–99)
Glucose-Capillary: 252 mg/dL — ABNORMAL HIGH (ref 70–99)

## 2023-11-02 LAB — CBC
HCT: 27.6 % — ABNORMAL LOW (ref 36.0–46.0)
Hemoglobin: 9.1 g/dL — ABNORMAL LOW (ref 12.0–15.0)
MCH: 30.7 pg (ref 26.0–34.0)
MCHC: 33 g/dL (ref 30.0–36.0)
MCV: 93.2 fL (ref 80.0–100.0)
Platelets: 168 10*3/uL (ref 150–400)
RBC: 2.96 MIL/uL — ABNORMAL LOW (ref 3.87–5.11)
RDW: 13.8 % (ref 11.5–15.5)
WBC: 9.6 10*3/uL (ref 4.0–10.5)
nRBC: 0 % (ref 0.0–0.2)

## 2023-11-02 LAB — BASIC METABOLIC PANEL WITH GFR
Anion gap: 11 (ref 5–15)
BUN: 37 mg/dL — ABNORMAL HIGH (ref 6–20)
CO2: 20 mmol/L — ABNORMAL LOW (ref 22–32)
Calcium: 8.1 mg/dL — ABNORMAL LOW (ref 8.9–10.3)
Chloride: 111 mmol/L (ref 98–111)
Creatinine, Ser: 1.44 mg/dL — ABNORMAL HIGH (ref 0.44–1.00)
GFR, Estimated: 50 mL/min — ABNORMAL LOW (ref >60)
Glucose, Bld: 146 mg/dL — ABNORMAL HIGH (ref 70–99)
Potassium: 3.4 mmol/L — ABNORMAL LOW (ref 3.5–5.1)
Sodium: 142 mmol/L (ref 135–145)

## 2023-11-02 LAB — MAGNESIUM: Magnesium: 2.6 mg/dL — ABNORMAL HIGH (ref 1.7–2.4)

## 2023-11-02 LAB — PHOSPHORUS: Phosphorus: 3.9 mg/dL (ref 2.5–4.6)

## 2023-11-02 MED ORDER — POTASSIUM CHLORIDE CRYS ER 20 MEQ PO TBCR
40.0000 meq | EXTENDED_RELEASE_TABLET | ORAL | Status: AC
  Administered 2023-11-02 (×2): 40 meq via ORAL
  Filled 2023-11-02 (×2): qty 2

## 2023-11-02 MED ORDER — LACTATED RINGERS IV SOLN: INTRAVENOUS | Status: DC

## 2023-11-02 MED ORDER — SODIUM CHLORIDE 0.9 % IV SOLN
2.0000 g | Freq: Four times a day (QID) | INTRAVENOUS | Status: DC
  Administered 2023-11-02 – 2023-11-04 (×8): 2 g via INTRAVENOUS
  Filled 2023-11-02 (×9): qty 2000

## 2023-11-02 MED ORDER — HYDROCORTISONE SOD SUC (PF) 100 MG IJ SOLR
50.0000 mg | Freq: Two times a day (BID) | INTRAMUSCULAR | Status: DC
  Administered 2023-11-03: 50 mg via INTRAVENOUS
  Filled 2023-11-02 (×2): qty 1

## 2023-11-02 NOTE — Progress Notes (Signed)
  Progress Note   Patient: Ashley Camacho KGM:[id number] DOB: [birth year] DOA: 10/31/2023     2 DOS: the patient was seen and examined on 11/02/2023   Brief hospital course: Ashley Camacho is a 33 y.o. female with medical history significant of  Adrenal insufficiency, Anemia,Chronic combined CHF, CKD3b, Gastroparesis, hypertension, hypothyroidism, DM Type 1 who presents to ED BIB EMS due to severe hypoglycemia at home.  She also had significant diarrhea for the last 3 days, as well as nausea vomiting. Upon arriving the hospital, patient was giving IV fluids, she is also given as needed D50 as well as stress dose of hydrocortisone . Her stool study came back with norovirus.   Principal Problem:   Sepsis secondary to UTI [facility]) Active Problems:   Hypomagnesemia   Hypophosphatemia   Sepsis due to urinary tract infection (HCC)   Norovirus   Assessment and Plan: Severe sepsis. Gastroenteritis secondary to norovirus. Likely acute cystitis. Enterococcus Avium septicemia. Patient came to the hospital with significant tachycardia, heart rate 116, lactic acid 6.8, procalcitonin level 53. Patient does has norovirus, CT scan also showed evidence of acute cystitis.  Blood culture came back positive for Enterococcus Avium.  Bacteremia could be from the GI tract. Currently, patient is on Rocephin  and vancomycin .  Will continue for now.  Pending cultures susceptibility. Patient still has significant diarrhea, will continue some IV fluids.  Uncontrolled type 1 diabetes with hyperglycemia and level 2 hypoglycemia. Continue low-dose insulin  glargine and sliding scale insulin .  Monitor glucose, D50 as needed. Patient is also treated with stress dose of Cortef .  Adrenal insufficiency with adrenal crisis. Which is a source of hypoglycemia. Continue stress dose of hydrocortisone .  Blood pressure was also low earlier, midodrine  was started.  Hypomagnesemia Hypophosphatemia. Normal anion gap metabolic  acidosis. Chronic kidney disease stage IIIa. Patient still has significant diarrhea, will keep some fluids.  Replete potassium. Magnesium  and phosphate has normalized.  Chronic diastolic congestive heart failure. No volume overload, patient does has dehydration.      Subjective:  Patient doing better today, no additional nausea vomiting.  No abdominal pain.  However, patient still has significant diarrhea.  Physical Exam: Vitals:   11/02/23 0025 11/02/23 0442 11/02/23 0742 11/02/23 1057  BP: (!) 164/77 (!) 141/68 (!) 166/79 (!) 154/76  Pulse: 82 76 78 77  Resp: 18 18 20 20   Temp: 98 F (36.7 C) (!) 97.1 F (36.2 C) 97.7 F (36.5 C) 98.2 F (36.8 C)  TempSrc: Oral Oral    SpO2: 100% 99% 100% 100%  Height:       General exam: Appears calm and comfortable  Respiratory system: Clear to auscultation. Respiratory effort normal. Cardiovascular system: S1 & S2 heard, RRR. No JVD, murmurs, rubs, gallops or clicks. No pedal edema. Gastrointestinal system: Abdomen is nondistended, soft and nontender. No organomegaly or masses felt. Normal bowel sounds heard. Central nervous system: Alert and oriented. No focal neurological deficits. Extremities: Symmetric 5 x 5 power. Skin: No rashes, lesions or ulcers Psychiatry: Judgement and insight appear normal. Mood & affect appropriate.    Data Reviewed:  CT scan results and lab results reviewed.  Family Communication: None  Disposition: Status is: Inpatient Remains inpatient appropriate because: Severity disease, IV treatment.     Time spent: 55 minutes  Author: Donaciano Frizzle, MD 11/02/2023 1:58 PM  For on call review [email or web address].   Hypokalemia P

## 2023-11-02 NOTE — Plan of Care (Signed)

## 2023-11-02 NOTE — Hospital Course (Addendum)
 KYRA LAFFEY is a 33 y.o. female with medical history significant of  Adrenal insufficiency, Anemia,Chronic combined CHF, CKD3b, Gastroparesis, hypertension, hypothyroidism, DM Type 1 who presents to ED BIB EMS due to severe hypoglycemia at home.  She also had significant diarrhea for the last 3 days, as well as nausea vomiting. Upon arriving the hospital, patient was giving IV fluids, she is also given as needed D50 as well as stress dose of hydrocortisone . Her stool study came back with norovirus. Patient is treated with fluids, her blood culture also positive for Enterococcus Avium, and by ID, recommended 5 days of IV antibiotics, which will be completed on 6/13. Patient condition has improved, medically stable for discharge.  Will resume home dose insulin .  Follow-up with PCP as outpatient.

## 2023-11-02 NOTE — Consult Note (Signed)
 Infectious Disease     Reason for Consult:Enterococcal bacteremia    Referring Physician: Dr Jeane Miguel Date of Admission:  10/31/2023   Principal Problem:   Sepsis secondary to UTI [facility]) Active Problems:   Hypomagnesemia   Hypophosphatemia   Sepsis due to urinary tract infection (HCC)   Norovirus   HPI: Ashley Camacho is a 33 y.o. 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  She did have sick contacts who had a gastroenteritis. On admission temp 100.1 white count 12.4.  LFTs normal viral respiratory panel negative.  Urine sample had only 0-5 white cells a 6-10 June 9.  She had GI PCR done which was positive for norovirus.  Blood cultures 1 of 2 done on admission was positive for Enterococcus Avium sensitivities pending. She has had imaging including CT of her abdomen which showed mild bladder wall distention but otherwise normal. She was started on vancomycin  and ceftriaxone  June 9. Clinically she has been afebrile.  Reports continued diarrhea and nausea but no fevers.  White blood count is down to 9.6.  She denies a prodrome prior to this acute onset of illness.  She denies any prior fevers chills night sweats weight loss.  She has had no joint or back pain.  She has had no skin lesions or changes on her hands or nails.  Past Medical History:  Diagnosis Date   Acute metabolic encephalopathy 06/10/2022   Adrenal insufficiency (HCC)    Anemia    CHF (congestive heart failure) (HCC)    Gastroparesis    Hypertension    Hypotension    Intractable nausea and vomiting 11/13/2022   Migraine    Thyroid  disease    Type 1 diabetes [facility])    Past Surgical History:  Procedure Laterality Date   BIOPSY  01/14/2021   Procedure: BIOPSY;  Surgeon: Lindle Rhea, MD;  Location: [facility] ENDOSCOPY;  Service: Gastroenterology;;   CENTRAL LINE INSERTION N/A 10/10/2023   Procedure: CENTRAL LINE INSERTION;  Surgeon: Celso College, MD;  Location: [facility] INVASIVE CV LAB;  Service: Cardiovascular;  Laterality: N/A;   CESAREAN SECTION     x2   CESAREAN SECTION WITH BILATERAL TUBAL LIGATION N/A 02/17/2021   Procedure: CESAREAN SECTION WITH BILATERAL TUBAL LIGATION;  Surgeon: Malka Sea, DO;  Location: [facility] LD ORS;  Service: Obstetrics;  Laterality: N/A;   ESOPHAGOGASTRODUODENOSCOPY (EGD) WITH PROPOFOL  N/A 01/14/2021   Procedure: ESOPHAGOGASTRODUODENOSCOPY (EGD) WITH PROPOFOL ;  Surgeon: Lindle Rhea, MD;  Location: [facility] ENDOSCOPY;  Service: Gastroenterology;  Laterality: N/A;   IR BONE MARROW BIOPSY & ASPIRATION  12/16/2022   IR REPLC DUODEN/JEJUNO TUBE PERCUT W/FLUORO  11/22/2022   JEJUNOSTOMY N/A 06/13/2022   Procedure: JEJUNOSTOMY;  Surgeon: Alben Alma, MD;  Location: [facility] ORS;  Service: General;  Laterality: N/A;   MOUTH SURGERY     Social History   Tobacco Use   Smoking status: Never    Passive exposure: Never   Smokeless tobacco: Never  Vaping Use   Vaping status: Never Used  Substance Use Topics   Alcohol use: No   Drug use: No   Family History  Problem Relation Age of Onset   Breast cancer Mother    Seizures Mother    Diabetes type I Father    CAD Father    Lung cancer Maternal Grandfather  CAD Paternal Grandmother    CAD Paternal Grandfather    Ovarian cancer Neg Hx     Allergies: No Known Allergies  Current antibiotics: Antibiotics Given (last 72 hours)     Date/Time Action Medication Dose Rate   10/31/23 0512 New Bag/Given   cefTRIAXone  (ROCEPHIN ) 1 g in sodium chloride  0.9 % 100 mL IVPB 1 g 200 mL/hr   10/31/23 9629 New Bag/Given   cefTRIAXone  (ROCEPHIN ) 1 g in sodium chloride  0.9 % 100 mL IVPB 1 g 200 mL/hr   10/31/23 1706 New Bag/Given   vancomycin  (VANCOCIN ) IVPB 1000 mg/200 mL premix 1,000 mg 180 mL/hr    11/01/23 0457 New Bag/Given   cefTRIAXone  (ROCEPHIN ) 2 g in sodium chloride  0.9 % 100 mL IVPB 2 g 200 mL/hr   11/01/23 0846 New Bag/Given   vancomycin  (VANCOREADY) IVPB 750 mg/150 mL 750 mg 150 mL/hr   11/02/23 0821 New Bag/Given   cefTRIAXone  (ROCEPHIN ) 2 g in sodium chloride  0.9 % 100 mL IVPB 2 g 200 mL/hr   11/02/23 1103 New Bag/Given   vancomycin  (VANCOCIN ) IVPB 1000 mg/200 mL premix 1,000 mg 200 mL/hr       MEDICATIONS:  calcium  carbonate  1 tablet Oral BID WC   divalproex   250 mg Oral Daily   enoxaparin  (LOVENOX ) injection  40 mg Subcutaneous Q24H   hydrocortisone  sod succinate (SOLU-CORTEF ) inj  100 mg Intravenous Q8H   insulin  aspart  0-6 Units Subcutaneous Q4H   insulin  glargine-yfgn  5 Units Subcutaneous QHS   levothyroxine   75 mcg Oral Q0600   potassium chloride   40 mEq Oral Q2H    Review of Systems - 11 systems reviewed and negative per HPI   OBJECTIVE: Temp:  [97.1 F (36.2 C)-98.3 F (36.8 C)] 98.2 F (36.8 C) (06/11 1057) Pulse Rate:  [73-82] 77 (06/11 1057) Resp:  [17-20] 20 (06/11 1057) BP: (105-166)/(68-88) 154/76 (06/11 1057) SpO2:  [99 %-100 %] 100 % (06/11 1057) Physical Exam  Constitutional:  oriented to person, place, and time. appears well-developed and well-nourished. Chronically ill appearing HENT: [place]/AT, PERRLA, no scleral icterus Mouth/Throat: Oropharynx is clear and moist. No oropharyngeal exudate.  Cardiovascular: Normal rate, regular rhythm and normal heart sounds. 2/6 sm Pulmonary/Chest: Effort normal and breath sounds normal. No respiratory distress.  has no wheezes.  Neck = supple, no nuchal rigidity Abdominal: Soft. Bowel sounds are normal.  exhibits no distension. There is no tenderness.  Lymphadenopathy: no cervical adenopathy. No axillary adenopathy Neurological: alert and oriented to person, place, and time.  Skin: Skin is warm and dry. No rash noted. No erythema. No stigmata of endocarditis Psychiatric: a normal mood and affect.   behavior is normal.    LABS: Results for orders placed or performed during the hospital encounter of 10/31/23 (from the past 48 hours)  Lactic acid, plasma     Status: None   Collection Time: 10/31/23  4:32 PM  Result Value Ref Range   Lactic Acid, Venous 1.5 0.5 - 1.9 mmol/L    Comment: Performed at [facility], [address] [place], [place] [address]  Glucose, capillary     Status: Abnormal   Collection Time: 10/31/23  4:38 PM  Result Value Ref Range   Glucose-Capillary 141 (H) 70 - 99 mg/dL    Comment: Glucose reference range applies only to samples taken after fasting for at least 8 hours.  Lactic acid, plasma     Status: None   Collection Time: 10/31/23  6:20 PM  Result Value Ref  Range   Lactic Acid, Venous 1.2 0.5 - 1.9 mmol/L    Comment: Performed at [facility], [address] [place], [place] [address]  Glucose, capillary     Status: Abnormal   Collection Time: 10/31/23  7:29 PM  Result Value Ref Range   Glucose-Capillary 226 (H) 70 - 99 mg/dL    Comment: Glucose reference range applies only to samples taken after fasting for at least 8 hours.  Lactic acid, plasma     Status: None   Collection Time: 10/31/23  8:21 PM  Result Value Ref Range   Lactic Acid, Venous 1.0 0.5 - 1.9 mmol/L    Comment: Performed at [facility], [address] [place], [place] [address]  Glucose, capillary     Status: Abnormal   Collection Time: 10/31/23 11:28 PM  Result Value Ref Range   Glucose-Capillary 209 (H) 70 - 99 mg/dL    Comment: Glucose reference range applies only to samples taken after fasting for at least 8 hours.  Glucose, capillary     Status: Abnormal   Collection Time: 11/01/23  3:47 AM  Result Value Ref Range   Glucose-Capillary 68 (L) 70 - 99 mg/dL    Comment: Glucose reference range applies only to samples taken after fasting for at least 8 hours.  Glucose, capillary     Status: Abnormal   Collection Time: 11/01/23  4:17 AM  Result  Value Ref Range   Glucose-Capillary 58 (L) 70 - 99 mg/dL    Comment: Glucose reference range applies only to samples taken after fasting for at least 8 hours.  Glucose, capillary     Status: Abnormal   Collection Time: 11/01/23  4:53 AM  Result Value Ref Range   Glucose-Capillary 100 (H) 70 - 99 mg/dL    Comment: Glucose reference range applies only to samples taken after fasting for at least 8 hours.  Comprehensive metabolic panel     Status: Abnormal   Collection Time: 11/01/23  6:36 AM  Result Value Ref Range   Sodium 144 135 - 145 mmol/L   Potassium 3.6 3.5 - 5.1 mmol/L   Chloride 114 (H) 98 - 111 mmol/L   CO2 19 (L) 22 - 32 mmol/L   Glucose, Bld 169 (H) 70 - 99 mg/dL    Comment: Glucose reference range applies only to samples taken after fasting for at least 8 hours.   BUN 37 (H) 6 - 20 mg/dL   Creatinine, Ser 0.98 (H) 0.44 - 1.00 mg/dL   Calcium  7.7 (L) 8.9 - 10.3 mg/dL   Total Protein 6.2 (L) 6.5 - 8.1 g/dL   Albumin  3.3 (L) 3.5 - 5.0 g/dL   AST 21 15 - 41 U/L   ALT 12 0 - 44 U/L   Alkaline Phosphatase 36 (L) 38 - 126 U/L   Total Bilirubin 0.6 0.0 - 1.2 mg/dL   GFR, Estimated 46 (L) >60 mL/min    Comment: (NOTE) Calculated using the CKD-EPI Creatinine Equation (2021)    Anion gap 11 5 - 15    Comment: Performed at [facility], [address] [place], [place] [address]  CBC     Status: Abnormal   Collection Time: 11/01/23  6:36 AM  Result Value Ref Range   WBC 6.5 4.0 - 10.5 K/uL   RBC 2.92 (L) 3.87 - 5.11 MIL/uL   Hemoglobin 8.7 (L) 12.0 - 15.0 g/dL   HCT 78.2 (L) 95.6 - 21.3 %   MCV 92.8 80.0 -  100.0 fL   MCH 29.8 26.0 - 34.0 pg   MCHC 32.1 30.0 - 36.0 g/dL   RDW 16.1 09.6 - 04.5 %   Platelets 140 (L) 150 - 400 K/uL   nRBC 0.0 0.0 - 0.2 %    Comment: Performed at [facility], [address] [place], [place] [address]  Glucose, capillary     Status: Abnormal   Collection Time: 11/01/23  8:13 AM  Result Value Ref Range   Glucose-Capillary  279 (H) 70 - 99 mg/dL    Comment: Glucose reference range applies only to samples taken after fasting for at least 8 hours.  Glucose, capillary     Status: Abnormal   Collection Time: 11/01/23 12:32 PM  Result Value Ref Range   Glucose-Capillary 225 (H) 70 - 99 mg/dL    Comment: Glucose reference range applies only to samples taken after fasting for at least 8 hours.  Glucose, capillary     Status: Abnormal   Collection Time: 11/01/23  4:43 PM  Result Value Ref Range   Glucose-Capillary 157 (H) 70 - 99 mg/dL    Comment: Glucose reference range applies only to samples taken after fasting for at least 8 hours.  C Difficile Quick Screen w PCR reflex     Status: None   Collection Time: 11/01/23  6:50 PM   Specimen: STOOL  Result Value Ref Range   C Diff antigen NEGATIVE NEGATIVE   C Diff toxin NEGATIVE NEGATIVE   C Diff interpretation No C. difficile detected.     Comment: Performed at [facility], [address] [place], [place] [address]  Gastrointestinal Panel by PCR , Stool     Status: Abnormal   Collection Time: 11/01/23  6:50 PM   Specimen: STOOL  Result Value Ref Range   Campylobacter species NOT DETECTED NOT DETECTED   Plesimonas shigelloides NOT DETECTED NOT DETECTED   Salmonella species NOT DETECTED NOT DETECTED   Yersinia enterocolitica NOT DETECTED NOT DETECTED   Vibrio species NOT DETECTED NOT DETECTED   Vibrio cholerae NOT DETECTED NOT DETECTED   Enteroaggregative E coli (EAEC) NOT DETECTED NOT DETECTED   Enteropathogenic E coli (EPEC) NOT DETECTED NOT DETECTED   Enterotoxigenic E coli (ETEC) NOT DETECTED NOT DETECTED   Shiga like toxin producing E coli (STEC) NOT DETECTED NOT DETECTED   Shigella/Enteroinvasive E coli (EIEC) NOT DETECTED NOT DETECTED   Cryptosporidium NOT DETECTED NOT DETECTED   Cyclospora cayetanensis NOT DETECTED NOT DETECTED   Entamoeba histolytica NOT DETECTED NOT DETECTED   Giardia lamblia NOT DETECTED NOT DETECTED   Adenovirus F40/41  NOT DETECTED NOT DETECTED   Astrovirus NOT DETECTED NOT DETECTED   Norovirus GI/GII DETECTED (A) NOT DETECTED    Comment: RESULT CALLED TO, READ BACK BY AND VERIFIED WITH: COURTNEY REAVES 11/01/23 2024 MU    Rotavirus A NOT DETECTED NOT DETECTED   Sapovirus (I, II, IV, and V) NOT DETECTED NOT DETECTED    Comment: Performed at [facility], [address] [place], [place] [address]  Glucose, capillary     Status: Abnormal   Collection Time: 11/01/23  8:59 PM  Result Value Ref Range   Glucose-Capillary 208 (H) 70 - 99 mg/dL    Comment: Glucose reference range applies only to samples taken after fasting for at least 8 hours.  Glucose, capillary     Status: Abnormal   Collection Time: 11/02/23  1:51 AM  Result Value Ref Range   Glucose-Capillary 253 (H) 70 -  99 mg/dL    Comment: Glucose reference range applies only to samples taken after fasting for at least 8 hours.  Glucose, capillary     Status: Abnormal   Collection Time: 11/02/23  4:40 AM  Result Value Ref Range   Glucose-Capillary 116 (H) 70 - 99 mg/dL    Comment: Glucose reference range applies only to samples taken after fasting for at least 8 hours.  Glucose, capillary     Status: Abnormal   Collection Time: 11/02/23  6:29 AM  Result Value Ref Range   Glucose-Capillary 67 (L) 70 - 99 mg/dL    Comment: Glucose reference range applies only to samples taken after fasting for at least 8 hours.  Glucose, capillary     Status: None   Collection Time: 11/02/23  6:47 AM  Result Value Ref Range   Glucose-Capillary 76 70 - 99 mg/dL    Comment: Glucose reference range applies only to samples taken after fasting for at least 8 hours.  Glucose, capillary     Status: Abnormal   Collection Time: 11/02/23  7:40 AM  Result Value Ref Range   Glucose-Capillary 119 (H) 70 - 99 mg/dL    Comment: Glucose reference range applies only to samples taken after fasting for at least 8 hours.  CBC     Status: Abnormal   Collection Time:  11/02/23  8:53 AM  Result Value Ref Range   WBC 9.6 4.0 - 10.5 K/uL   RBC 2.96 (L) 3.87 - 5.11 MIL/uL   Hemoglobin 9.1 (L) 12.0 - 15.0 g/dL   HCT 57.8 (L) 46.9 - 62.9 %   MCV 93.2 80.0 - 100.0 fL   MCH 30.7 26.0 - 34.0 pg   MCHC 33.0 30.0 - 36.0 g/dL   RDW 52.8 41.3 - 24.4 %   Platelets 168 150 - 400 K/uL   nRBC 0.0 0.0 - 0.2 %    Comment: Performed at [facility], [address], [place], [place] [address]  Basic metabolic panel with GFR     Status: Abnormal   Collection Time: 11/02/23  8:53 AM  Result Value Ref Range   Sodium 142 135 - 145 mmol/L   Potassium 3.4 (L) 3.5 - 5.1 mmol/L   Chloride 111 98 - 111 mmol/L   CO2 20 (L) 22 - 32 mmol/L   Glucose, Bld 146 (H) 70 - 99 mg/dL    Comment: Glucose reference range applies only to samples taken after fasting for at least 8 hours.   BUN 37 (H) 6 - 20 mg/dL   Creatinine, Ser 2.53 (H) 0.44 - 1.00 mg/dL   Calcium  8.1 (L) 8.9 - 10.3 mg/dL   GFR, Estimated 50 (L) >60 mL/min    Comment: (NOTE) Calculated using the CKD-EPI Creatinine Equation (2021)    Anion gap 11 5 - 15    Comment: Performed at [facility], [address] [place], [place] [address]  Magnesium      Status: Abnormal   Collection Time: 11/02/23  8:53 AM  Result Value Ref Range   Magnesium  2.6 (H) 1.7 - 2.4 mg/dL    Comment: Performed at [facility], [address], [place], [place] [address]  Phosphorus     Status: None   Collection Time: 11/02/23  8:53 AM  Result Value Ref Range   Phosphorus 3.9 2.5 - 4.6 mg/dL    Comment: Performed at [facility], [address] [place], [place] [address]  Glucose, capillary  Status: Abnormal   Collection Time: 11/02/23 10:55 AM  Result Value Ref Range   Glucose-Capillary 263 (H) 70 - 99 mg/dL    Comment: Glucose reference range applies only to samples taken after fasting for at least 8 hours.   No components found for: ESR, C REACTIVE PROTEIN MICRO: Recent Results (from the  past 720 hours)  MRSA Next Gen by PCR, Nasal     Status: None   Collection Time: 10/10/23  4:51 PM   Specimen: Nasal Mucosa; Nasal Swab  Result Value Ref Range Status   MRSA by PCR Next Gen NOT DETECTED NOT DETECTED Final    Comment: (NOTE) The GeneXpert MRSA Assay (FDA approved for NASAL specimens only), is one component of a comprehensive MRSA colonization surveillance program. It is not intended to diagnose MRSA infection nor to guide or monitor treatment for MRSA infections. Test performance is not FDA approved in patients less than 82 years old. Performed at [facility], [address] [place], [place] [address]   C Difficile Quick Screen w PCR reflex     Status: None   Collection Time: 10/11/23 11:08 AM   Specimen: STOOL  Result Value Ref Range Status   C Diff antigen NEGATIVE NEGATIVE Final   C Diff toxin NEGATIVE NEGATIVE Final   C Diff interpretation No C. difficile detected.  Final    Comment: Performed at [facility], [address] [place], [place] [address]  Culture, blood (Routine x 2)     Status: Abnormal (Preliminary result)   Collection Time: 10/31/23  2:00 AM   Specimen: BLOOD  Result Value Ref Range Status   Specimen Description   Final    BLOOD LEFT HAND Performed at [facility], [address] [place], [place] [address]    Special Requests   Final    BOTTLES DRAWN AEROBIC AND ANAEROBIC Blood Culture results may not be optimal due to an inadequate volume of blood received in culture bottles Performed at [facility], [address] [place], [place] [address]    Culture  Setup Time   Final    GRAM POSITIVE COCCI ANAEROBIC BOTTLE ONLY CRITICAL RESULT CALLED TO, READ BACK BY AND VERIFIED WITH: ALEX CHAPPELL 2105 10/31/23 LRL    Culture (A)  Final    ENTEROCOCCUS AVIUM SUSCEPTIBILITIES TO FOLLOW Performed at [facility], [address] [place], [place] [address]    Report Status PENDING  Incomplete   Blood Culture ID Panel (Reflexed)     Status: None   Collection Time: 10/31/23  2:00 AM  Result Value Ref Range Status   Enterococcus faecalis NOT DETECTED NOT DETECTED Final   Enterococcus Faecium NOT DETECTED NOT DETECTED Final   Listeria monocytogenes NOT DETECTED NOT DETECTED Final   Staphylococcus species NOT DETECTED NOT DETECTED Final   Staphylococcus aureus (BCID) NOT DETECTED NOT DETECTED Final   Staphylococcus epidermidis NOT DETECTED NOT DETECTED Final   Staphylococcus lugdunensis NOT DETECTED NOT DETECTED Final   Streptococcus species NOT DETECTED NOT DETECTED Final   Streptococcus agalactiae NOT DETECTED NOT DETECTED Final   Streptococcus pneumoniae NOT DETECTED NOT DETECTED Final   Streptococcus pyogenes NOT DETECTED NOT DETECTED Final   A.calcoaceticus-baumannii NOT DETECTED NOT DETECTED Final   Bacteroides fragilis NOT DETECTED NOT DETECTED Final   Enterobacterales NOT DETECTED NOT DETECTED Final   Enterobacter cloacae complex NOT DETECTED NOT DETECTED Final   Escherichia coli NOT DETECTED NOT DETECTED Final   Klebsiella aerogenes NOT DETECTED NOT  DETECTED Final   Klebsiella oxytoca NOT DETECTED NOT DETECTED Final   Klebsiella pneumoniae NOT DETECTED NOT DETECTED Final   Proteus species NOT DETECTED NOT DETECTED Final   Salmonella species NOT DETECTED NOT DETECTED Final   Serratia marcescens NOT DETECTED NOT DETECTED Final   Haemophilus influenzae NOT DETECTED NOT DETECTED Final   Neisseria meningitidis NOT DETECTED NOT DETECTED Final   Pseudomonas aeruginosa NOT DETECTED NOT DETECTED Final   Stenotrophomonas maltophilia NOT DETECTED NOT DETECTED Final   Candida albicans NOT DETECTED NOT DETECTED Final   Candida auris NOT DETECTED NOT DETECTED Final   Candida glabrata NOT DETECTED NOT DETECTED Final   Candida krusei NOT DETECTED NOT DETECTED Final   Candida parapsilosis NOT DETECTED NOT DETECTED Final   Candida tropicalis NOT DETECTED NOT DETECTED Final    Cryptococcus neoformans/gattii NOT DETECTED NOT DETECTED Final    Comment: Performed at [facility], [address] [place], [place] [address]  Culture, blood (Routine x 2)     Status: None (Preliminary result)   Collection Time: 10/31/23  2:20 AM   Specimen: BLOOD  Result Value Ref Range Status   Specimen Description BLOOD RIGHT HAND  Final   Special Requests   Final    BOTTLES DRAWN AEROBIC AND ANAEROBIC Blood Culture results may not be optimal due to an inadequate volume of blood received in culture bottles   Culture   Final    NO GROWTH 2 DAYS Performed at [facility], [address] [place], [place] [address]    Report Status PENDING  Incomplete  Resp panel by RT-PCR (RSV, Flu A&B, Covid) Anterior Nasal Swab     Status: None   Collection Time: 10/31/23  2:22 AM   Specimen: Anterior Nasal Swab  Result Value Ref Range Status   SARS Coronavirus 2 by RT PCR NEGATIVE NEGATIVE Final    Comment: (NOTE) SARS-CoV-2 target nucleic acids are NOT DETECTED.  The SARS-CoV-2 RNA is generally detectable in upper respiratory specimens during the acute phase of infection. The lowest concentration of SARS-CoV-2 viral copies this assay can detect is 138 copies/mL. A negative result does not preclude SARS-Cov-2 infection and should not be used as the sole basis for treatment or other patient management decisions. A negative result may occur with  improper specimen collection/handling, submission of specimen other than nasopharyngeal swab, presence of viral mutation(s) within the areas targeted by this assay, and inadequate number of viral copies(<138 copies/mL). A negative result must be combined with clinical observations, patient history, and epidemiological information. The expected result is Negative.  Fact Sheet for Patients:  BloggerCourse.com  Fact Sheet for Healthcare Providers:  SeriousBroker.it  This test is  no t yet approved or cleared by the [place]  FDA and  has been authorized for detection and/or diagnosis of SARS-CoV-2 by FDA under an Emergency Use Authorization (EUA). This EUA will remain  in effect (meaning this test can be used) for the duration of the COVID-19 declaration under Section 564(b)(1) of the Act, 21 U.S.C.section 360bbb-3(b)(1), unless the authorization is terminated  or revoked sooner.       Influenza A by PCR NEGATIVE NEGATIVE Final   Influenza B by PCR NEGATIVE NEGATIVE Final    Comment: (NOTE) The Xpert Xpress SARS-CoV-2/FLU/RSV plus assay is intended as an aid in the diagnosis of influenza from Nasopharyngeal swab specimens and should not be used as a sole basis for treatment. Nasal washings and aspirates are unacceptable for Xpert Xpress SARS-CoV-2/FLU/RSV testing.  Fact Sheet  for Patients: BloggerCourse.com  Fact Sheet for Healthcare Providers: SeriousBroker.it  This test is not yet approved or cleared by the [place]  FDA and has been authorized for detection and/or diagnosis of SARS-CoV-2 by FDA under an Emergency Use Authorization (EUA). This EUA will remain in effect (meaning this test can be used) for the duration of the COVID-19 declaration under Section 564(b)(1) of the Act, 21 U.S.C. section 360bbb-3(b)(1), unless the authorization is terminated or revoked.     Resp Syncytial Virus by PCR NEGATIVE NEGATIVE Final    Comment: (NOTE) Fact Sheet for Patients: BloggerCourse.com  Fact Sheet for Healthcare Providers: SeriousBroker.it  This test is not yet approved or cleared by the [place]  FDA and has been authorized for detection and/or diagnosis of SARS-CoV-2 by FDA under an Emergency Use Authorization (EUA). This EUA will remain in effect (meaning this test can be used) for the duration of the COVID-19 declaration under Section  564(b)(1) of the Act, 21 U.S.C. section 360bbb-3(b)(1), unless the authorization is terminated or revoked.  Performed at [facility], [address] [place], [place] [address]   C Difficile Quick Screen w PCR reflex     Status: None   Collection Time: 11/01/23  6:50 PM   Specimen: STOOL  Result Value Ref Range Status   C Diff antigen NEGATIVE NEGATIVE Final   C Diff toxin NEGATIVE NEGATIVE Final   C Diff interpretation No C. difficile detected.  Final    Comment: Performed at [facility], [address] [place], [place] [address]  Gastrointestinal Panel by PCR , Stool     Status: Abnormal   Collection Time: 11/01/23  6:50 PM   Specimen: STOOL  Result Value Ref Range Status   Campylobacter species NOT DETECTED NOT DETECTED Final   Plesimonas shigelloides NOT DETECTED NOT DETECTED Final   Salmonella species NOT DETECTED NOT DETECTED Final   Yersinia enterocolitica NOT DETECTED NOT DETECTED Final   Vibrio species NOT DETECTED NOT DETECTED Final   Vibrio cholerae NOT DETECTED NOT DETECTED Final   Enteroaggregative E coli (EAEC) NOT DETECTED NOT DETECTED Final   Enteropathogenic E coli (EPEC) NOT DETECTED NOT DETECTED Final   Enterotoxigenic E coli (ETEC) NOT DETECTED NOT DETECTED Final   Shiga like toxin producing E coli (STEC) NOT DETECTED NOT DETECTED Final   Shigella/Enteroinvasive E coli (EIEC) NOT DETECTED NOT DETECTED Final   Cryptosporidium NOT DETECTED NOT DETECTED Final   Cyclospora cayetanensis NOT DETECTED NOT DETECTED Final   Entamoeba histolytica NOT DETECTED NOT DETECTED Final   Giardia lamblia NOT DETECTED NOT DETECTED Final   Adenovirus F40/41 NOT DETECTED NOT DETECTED Final   Astrovirus NOT DETECTED NOT DETECTED Final   Norovirus GI/GII DETECTED (A) NOT DETECTED Final    Comment: RESULT CALLED TO, READ BACK BY AND VERIFIED WITH: COURTNEY REAVES 11/01/23 2024 MU    Rotavirus A NOT DETECTED NOT DETECTED Final   Sapovirus (I, II, IV, and V)  NOT DETECTED NOT DETECTED Final    Comment: Performed at [facility], [address] [place], [place] [address]    IMAGING: CT ABDOMEN PELVIS W CONTRAST Result Date: 10/31/2023 CLINICAL DATA:  Bowel obstruction suspected EXAM: CT ABDOMEN AND PELVIS WITH CONTRAST TECHNIQUE: Multidetector CT imaging of the abdomen and pelvis was performed using the standard protocol following bolus administration of intravenous contrast. RADIATION DOSE REDUCTION: This exam was performed according to the departmental dose-optimization program which includes automated exposure control, adjustment of the mA and/or kV according to patient  size and/or use of iterative reconstruction technique. CONTRAST:  75mL OMNIPAQUE  IOHEXOL  300 MG/ML  SOLN COMPARISON:  07/23/2023 FINDINGS: Lower Chest: Normal. Hepatobiliary: Normal hepatic contours. No intra- or extrahepatic biliary dilatation. The gallbladder is normal. Pancreas: Normal pancreas. No ductal dilatation or peripancreatic fluid collection. Spleen: Normal. Adrenals/Urinary Tract: The adrenal glands are normal. No hydronephrosis, nephroureterolithiasis or solid renal mass. Mild wall thickening, possibly exaggerated by underdistention. Stomach/Bowel: There is no hiatal hernia. Normal duodenal course and caliber. No small bowel dilatation or inflammation. No focal colonic abnormality. The appendix is not visualized. No right lower quadrant inflammation or free fluid. Vascular/Lymphatic: Normal course and caliber of the major abdominal vessels. No abdominal or pelvic lymphadenopathy. Reproductive: Normal uterus. No adnexal mass. Other: None. Musculoskeletal: No bony spinal canal stenosis or focal osseous abnormality. IMPRESSION: 1. No acute abnormality of the abdomen or pelvis. 2. Mild wall thickening of the urinary bladder, possibly exaggerated by underdistention. Correlate with urinalysis to exclude cystitis. Electronically Signed   By: Juanetta Nordmann M.D.   On: 10/31/2023  04:24   DG Chest Port 1 View Result Date: 10/31/2023 CLINICAL DATA:  Sepsis EXAM: PORTABLE CHEST 1 VIEW COMPARISON:  05/26/2023 FINDINGS: The heart size and mediastinal contours are within normal limits. Both lungs are clear. The visualized skeletal structures are unremarkable. IMPRESSION: No active disease. Electronically Signed   By: Juanetta Nordmann M.D.   On: 10/31/2023 03:18   CARDIAC CATHETERIZATION Result Date: 10/10/2023 See surgical note for result.   Assessment:   Ashley Camacho is a 33 y.o. female with a complicated medical history including insulin -dependent diabetes, gastroparesis valvular heart disease admitted with acute onset of GI illness low-grade fevers found to have norovirus infection.  She has also had 1 of 2 blood cultures growing Enterococcus AVM.  CT imaging of her abdomen is negative. I believe this is likely gut source with her norovirus leading to translocation.  She had no prodrome symptoms and does not seem to have had prolonged bacteremia although it is difficult to exclude.  She does have valvular heart disease. Overall I think this is likely incidental bacteremia but she should be evaluated. DENOVA score of 3 (unknown source (likely GI but not confirmed), prior valve disesase and murmur on exam) Day 3 of vanco   Recommendations Repeat blood culture-ordered Echo ordered Continue vancomycin  Can change ceftriaxone  to ampicillin .  If fu bcx neg, TTE negative and clinically improving then likely no need for further treatment as should have completed 5 days of treatment between the vanco and the ampicillin .   Thank you very much for allowing me to participate in the care of this patient. Please call with questions.   Merri Abbe. Harwood Lingo, MD

## 2023-11-03 LAB — CULTURE, BLOOD (ROUTINE X 2)

## 2023-11-03 LAB — BASIC METABOLIC PANEL WITH GFR
Anion gap: 8 (ref 5–15)
BUN: 36 mg/dL — ABNORMAL HIGH (ref 6–20)
CO2: 19 mmol/L — ABNORMAL LOW (ref 22–32)
Calcium: 8.3 mg/dL — ABNORMAL LOW (ref 8.9–10.3)
Chloride: 115 mmol/L — ABNORMAL HIGH (ref 98–111)
Creatinine, Ser: 1.52 mg/dL — ABNORMAL HIGH (ref 0.44–1.00)
GFR, Estimated: 46 mL/min — ABNORMAL LOW (ref >60)
Glucose, Bld: 253 mg/dL — ABNORMAL HIGH (ref 70–99)
Potassium: 4.2 mmol/L (ref 3.5–5.1)
Sodium: 142 mmol/L (ref 135–145)

## 2023-11-03 LAB — NOROVIRUS GROUP 1 & 2 BY PCR, STOOL
Norovirus 1 by PCR: POSITIVE — AB
Norovirus 2  by PCR: NEGATIVE

## 2023-11-03 LAB — GLUCOSE, CAPILLARY
Glucose-Capillary: 159 mg/dL — ABNORMAL HIGH (ref 70–99)
Glucose-Capillary: 163 mg/dL — ABNORMAL HIGH (ref 70–99)
Glucose-Capillary: 205 mg/dL — ABNORMAL HIGH (ref 70–99)
Glucose-Capillary: 208 mg/dL — ABNORMAL HIGH (ref 70–99)
Glucose-Capillary: 226 mg/dL — ABNORMAL HIGH (ref 70–99)
Glucose-Capillary: 259 mg/dL — ABNORMAL HIGH (ref 70–99)

## 2023-11-03 LAB — PHOSPHORUS: Phosphorus: 3.1 mg/dL (ref 2.5–4.6)

## 2023-11-03 LAB — ECHOCARDIOGRAM COMPLETE
Area-P 1/2: 4.58 cm2
Height: 61 in
S' Lateral: 3.2 cm

## 2023-11-03 LAB — MAGNESIUM: Magnesium: 2.2 mg/dL (ref 1.7–2.4)

## 2023-11-03 MED ORDER — INSULIN GLARGINE-YFGN 100 UNIT/ML ~~LOC~~ SOLN
8.0000 [IU] | Freq: Every day | SUBCUTANEOUS | Status: DC
  Administered 2023-11-03: 8 [IU] via SUBCUTANEOUS
  Filled 2023-11-03 (×2): qty 0.08

## 2023-11-03 MED ORDER — PANCRELIPASE (LIP-PROT-AMYL) 12000-38000 UNITS PO CPEP
24000.0000 [IU] | ORAL_CAPSULE | Freq: Three times a day (TID) | ORAL | Status: DC
  Administered 2023-11-03 – 2023-11-04 (×4): 24000 [IU] via ORAL
  Filled 2023-11-03 (×5): qty 2

## 2023-11-03 MED ORDER — HYDROCORTISONE 10 MG PO TABS
20.0000 mg | ORAL_TABLET | Freq: Every day | ORAL | Status: DC
  Administered 2023-11-03 – 2023-11-04 (×2): 20 mg via ORAL
  Filled 2023-11-03 (×2): qty 2

## 2023-11-03 MED ORDER — HYDROCORTISONE 10 MG PO TABS
10.0000 mg | ORAL_TABLET | Freq: Every day | ORAL | Status: DC
  Administered 2023-11-03: 10 mg via ORAL
  Filled 2023-11-03 (×2): qty 1

## 2023-11-03 MED ORDER — SODIUM BICARBONATE 650 MG PO TABS
1300.0000 mg | ORAL_TABLET | Freq: Two times a day (BID) | ORAL | Status: DC
  Administered 2023-11-03 – 2023-11-04 (×3): 1300 mg via ORAL
  Filled 2023-11-03 (×3): qty 2

## 2023-11-03 NOTE — Inpatient Diabetes Management (Signed)
 Inpatient Diabetes Program Recommendations  AACE/ADA: New Consensus Statement on Inpatient Glycemic Control  Target Ranges:  Prepandial:   less than 140 mg/dL      Peak postprandial:   less than 180 mg/dL (1-2 hours)      Critically ill patients:  140 - 180 mg/dL    Latest Reference Range & Units 11/03/23 04:18 11/03/23 07:34  Glucose-Capillary 70 - 99 mg/dL 130 (H) 865 (H)    Latest Reference Range & Units 11/02/23 07:40 11/02/23 10:55 11/02/23 15:02 11/02/23 20:23 11/02/23 21:33 11/02/23 23:16  Glucose-Capillary 70 - 99 mg/dL 784 (H) 696 (H) 295 (H) 224 (H) 267 (H) 252 (H)   Review of Glycemic Control  Diabetes history: DM1 Outpatient Diabetes medications: Lantus  10 units daily, Novolog  0-11 units TID, Novolog  2 units TID with meals; Cortef  20 mg QAM, Cortef  10 mg QPM, Dexcom G7 Current orders for Inpatient glycemic control: Semglee  5 units at bedtime, Novolog  0-6 units Q4H; Solucortef 50 mg Q12H  Inpatient Diabetes Program Recommendations:    Insulin : Noted patient refused Novolog  correction at midnight and 4am today. May want to consider changing CBGs to AC&HS and Novolog  correction to 0-6 units AC&HS.  Please consider increasing Semglee  to 7 units at bedtime and ordering Novolog  2 units TID with meals for meal coverage if patient eats at least 50% of meals.  Thanks, Beacher Limerick, RN, MSN, CDCES Diabetes Coordinator Inpatient Diabetes Program [phone] (Team Pager from 8am to 5pm)

## 2023-11-03 NOTE — Plan of Care (Signed)

## 2023-11-03 NOTE — Progress Notes (Signed)
 Progress Note   Patient: Ashley Camacho:[id number] DOB: 03/21/1991 DOA: 10/31/2023     3 DOS: the patient was seen and examined on 11/03/2023   Brief hospital course: DAQUISHA CLERMONT is a 33 y.o. female with medical history significant of  Adrenal insufficiency, Anemia,Chronic combined CHF, CKD3b, Gastroparesis, hypertension, hypothyroidism, DM Type 1 who presents to ED BIB EMS due to severe hypoglycemia at home.  She also had significant diarrhea for the last 3 days, as well as nausea vomiting. Upon arriving the hospital, patient was giving IV fluids, she is also given as needed D50 as well as stress dose of hydrocortisone . Her stool study came back with norovirus. Patient is treated with fluids, her blood culture also positive for Enterococcus Avium, and by ID, recommended 5 days of IV antibiotics.   Principal Problem:   Sepsis secondary to UTI [facility]) Active Problems:   Hypomagnesemia   Hypophosphatemia   Sepsis due to urinary tract infection (HCC)   Norovirus   Assessment and Plan: Severe sepsis. Gastroenteritis secondary to norovirus. Likely acute cystitis. Enterococcus Avium septicemia. Patient came to the hospital with significant tachycardia, heart rate 116, lactic acid 6.8, procalcitonin level 53. Patient does has norovirus, CT scan also showed evidence of acute cystitis.  Blood culture came back positive for Enterococcus Avium.  Bacteremia could be from the GI tract. Currently, patient is on Rocephin  and vancomycin .  Seen by ID 6/11, antibiotic switched to ampicillin  and vancomycin .  Recommendation for 5 days of antibiotics, which she will complete by tomorrow. Patient still has some diarrhea, but is tolerating diet.  No additional fluids needed.  Patient also has chronic diarrhea with some absorption, Creon  started.   Uncontrolled type 1 diabetes with hyperglycemia and level 2 hypoglycemia. Patient no longer has any hypoglycemia, glucose running high again.  But steroid  dose was decreased.  Patient chronically was at home on 10 units of Lantus , I will increase dose to 8 units for now.    Adrenal insufficiency with adrenal crisis. Which is a source of hypoglycemia. Continue stress dose of hydrocortisone .  Blood pressure was also low earlier, midodrine  was started. Condition has improved, change to home dose steroids.   Hypomagnesemia Hypophosphatemia. Normal anion gap metabolic acidosis. Chronic kidney disease stage IIIa. Condition mostly improved, will start sodium bicarb for metabolic acidosis.   Chronic diastolic congestive heart failure. Still stable, no volume overload.       Subjective:  Patient still complaining significant diarrhea, but no nausea vomiting.  Tolerating diet.  Physical Exam: Vitals:   11/02/23 2317 11/03/23 0419 11/03/23 0733 11/03/23 1150  BP: (!) 171/76 (!) 180/86 (!) 181/73 (!) 170/79  Pulse: 61 69 65 64  Resp: 19 19 18 18   Temp: 97.9 F (36.6 C) 98 F (36.7 C) 98.1 F (36.7 C) 97.9 F (36.6 C)  TempSrc:  Oral    SpO2: 100% 100% 100% 99%  Height:       General exam: Appears calm and comfortable  Respiratory system: Clear to auscultation. Respiratory effort normal. Cardiovascular system: S1 & S2 heard, RRR. No JVD, murmurs, rubs, gallops or clicks. No pedal edema. Gastrointestinal system: Abdomen is nondistended, soft and nontender. No organomegaly or masses felt. Normal bowel sounds heard. Central nervous system: Alert and oriented. No focal neurological deficits. Extremities: Symmetric 5 x 5 power. Skin: No rashes, lesions or ulcers Psychiatry: Judgement and insight appear normal. Mood & affect appropriate.    Data Reviewed:  Lab results reviewed.  Family Communication: None  Disposition:  Status is: Inpatient Remains inpatient appropriate because: Severity of disease.     Time spent: 35 minutes  Author: Donaciano Frizzle, MD 11/03/2023 12:36 PM  For on call review [email or web address].

## 2023-11-04 ENCOUNTER — Other Ambulatory Visit: Payer: Self-pay

## 2023-11-04 LAB — BASIC METABOLIC PANEL WITH GFR
Anion gap: 7 (ref 5–15)
BUN: 38 mg/dL — ABNORMAL HIGH (ref 6–20)
CO2: 23 mmol/L (ref 22–32)
Calcium: 8.1 mg/dL — ABNORMAL LOW (ref 8.9–10.3)
Chloride: 113 mmol/L — ABNORMAL HIGH (ref 98–111)
Creatinine, Ser: 1.69 mg/dL — ABNORMAL HIGH (ref 0.44–1.00)
GFR, Estimated: 41 mL/min — ABNORMAL LOW (ref >60)
Glucose, Bld: 147 mg/dL — ABNORMAL HIGH (ref 70–99)
Potassium: 3.7 mmol/L (ref 3.5–5.1)
Sodium: 143 mmol/L (ref 135–145)

## 2023-11-04 LAB — PHOSPHORUS: Phosphorus: 2.7 mg/dL (ref 2.5–4.6)

## 2023-11-04 LAB — GLUCOSE, CAPILLARY
Glucose-Capillary: 161 mg/dL — ABNORMAL HIGH (ref 70–99)
Glucose-Capillary: 115 mg/dL — ABNORMAL HIGH (ref 70–99)
Glucose-Capillary: 183 mg/dL — ABNORMAL HIGH (ref 70–99)

## 2023-11-04 LAB — MAGNESIUM: Magnesium: 1.9 mg/dL (ref 1.7–2.4)

## 2023-11-04 MED ORDER — SODIUM BICARBONATE 650 MG PO TABS
650.0000 mg | ORAL_TABLET | Freq: Two times a day (BID) | ORAL | 0 refills | Status: AC
Start: 2023-11-04 — End: 2023-11-07
  Filled 2023-11-04: qty 6, 3d supply, fill #0

## 2023-11-04 MED ORDER — PANCRELIPASE (LIP-PROT-AMYL) 24000-76000 UNITS PO CPEP
24000.0000 [IU] | ORAL_CAPSULE | Freq: Three times a day (TID) | ORAL | 0 refills | Status: DC
  Filled 2023-11-04: qty 200, 67d supply, fill #0

## 2023-11-04 NOTE — Progress Notes (Signed)
 Pt alert and oriented. VSS. Discharge instructions reviewed with pt. Pt verbalized understanding. Pt verbalized understanding of need to schedule/attend follow up appointments. Prescriptions delivered to  bedside. Pt verbalized understanding of changes to medications. 2 IV's removed. IV sites WNL. Pt left unit in wheelchair with volunteer staff. Pt left hospital with friend.

## 2023-11-04 NOTE — TOC Transition Note (Incomplete)
 Transition of Care [facility]) - Discharge Note   Patient Details  Name: Ashley Camacho MRN: [id number] Date of Birth: August 19, 1990  Transition of Care [facility]) CM/SW Contact:  Chrystie Hagwood C Vere Diantonio, RN Phone Number: 11/04/2023, 10:56 AM   Clinical Narrative:    Risk assessment completed  Admitted for: hypoglycemia  Admitted from: Home. Lives with roommate and her children. Current home health/prior home health/DME:No HH or DME Transportation:     Final next level of care: Home/Self Care Barriers to Discharge: Barriers Resolved   Patient Goals and CMS Choice Patient states their goals for this hospitalization and ongoing recovery are:: Home          Discharge Placement                       Discharge Plan and Services Additional resources added to the After Visit Summary for                                       Social Drivers of Health (SDOH) Interventions SDOH Screenings   Food Insecurity: Food Insecurity Present (10/31/2023)  Housing: Low Risk  (10/31/2023)  Transportation Needs: Unmet Transportation Needs (10/31/2023)  Utilities: Not At Risk (10/31/2023)  Recent Concern: Utilities - At Risk (08/04/2023)   [facility]  Depression [phone]): Medium Risk (10/21/2023)  Financial Resource Strain: Low Risk  (08/04/2023)   Received from [facility]  Recent Concern: Financial Resource Strain - High Risk (05/09/2023)  Physical Activity: Unknown (05/09/2023)  Social Connections: Socially Isolated (10/31/2023)  Stress: Stress Concern Present (05/09/2023)  Tobacco Use: Low Risk  (10/31/2023)     Readmission Risk Interventions    11/04/2023   10:48 AM 04/07/2023   11:07 AM 02/17/2023    3:02 PM  Readmission Risk Prevention Plan  Transportation Screening Complete Complete Complete  Medication Review [employer]) Complete Complete Complete  PCP or Specialist appointment within 3-5 days of discharge Complete Complete  Complete  HRI or Home Care Consult Not Complete  Not Complete  HRI or Home Care Consult Pt Refusal Comments n/a  N/A  SW Recovery Care/Counseling Consult Complete Complete Complete  Palliative Care Screening Not Applicable Not Applicable Not Applicable  Skilled Nursing Facility Not Applicable Not Applicable Not Applicable

## 2023-11-04 NOTE — Discharge Summary (Signed)
 Physician Discharge Summary   Patient: Ashley Camacho MRN: [id number] DOB: 07-14-1990  Admit date:     10/31/2023  Discharge date: 11/04/23  Discharge Physician: Donaciano Frizzle   PCP: Ostwalt, [name], PA-C   Recommendations at discharge:   Follow-up with PCP in 1 week. Follow-up with endocrinologist as previously scheduled.  Discharge Diagnoses: Principal Problem:   Sepsis secondary to UTI [facility]) Active Problems:   Hypomagnesemia   Hypophosphatemia   Sepsis due to urinary tract infection (HCC)   Norovirus  Resolved Problems:   * No resolved hospital problems. *  Hospital Course: Ashley Camacho is a 33 y.o. female with medical history significant of  Adrenal insufficiency, Anemia,Chronic combined CHF, CKD3b, Gastroparesis, hypertension, hypothyroidism, DM Type 1 who presents to ED BIB EMS due to severe hypoglycemia at home.  She also had significant diarrhea for the last 3 days, as well as nausea vomiting. Upon arriving the hospital, patient was giving IV fluids, she is also given as needed D50 as well as stress dose of hydrocortisone . Her stool study came back with norovirus. Patient is treated with fluids, her blood culture also positive for Enterococcus Avium, and by ID, recommended 5 days of IV antibiotics, which will be completed on 6/13. Patient condition has improved, medically stable for discharge.  Will resume home dose insulin .  Follow-up with PCP as outpatient.  Assessment and Plan: Severe sepsis secondary to norovirus. Gastroenteritis secondary to norovirus. Likely acute cystitis. Enterococcus Avium septicemia. Patient came to the hospital with significant tachycardia, heart rate 116, lactic acid 6.8, procalcitonin level 53. Patient does has norovirus, CT scan also showed evidence of acute cystitis.  Blood culture came back positive for Enterococcus Avium.  Bacteremia could be from the GI tract. Currently, patient is on Rocephin  and vancomycin  since 6/9.  Seen by ID  6/11, antibiotic switched to ampicillin  and vancomycin .  Recommendation for 5 days of antibiotics, which she will complete today.  Patient was given final dose of ampicillin  at 12 noon today. Echocardiogram did not show any evidence of endocarditis, repeat blood culture was negative. Patient had a significant diarrhea, which was initially caused by norovirus.  But she also had a component of malabsorption, diarrhea much better after starting Creon .  Will continue.   Uncontrolled type 1 diabetes with hyperglycemia and level 2 hypoglycemia.  Adrenal insufficiency with adrenal crisis. Had a significant hypoglycemia at time of admission, her blood pressure was also low, she was started on stress dose of cortisone.  Condition has improved.  Will resume home dose hydrocortisone  as well as home dose insulin .   Hypomagnesemia Hypophosphatemia. Normal anion gap metabolic acidosis. Chronic kidney disease stage IIIa. Acute kidney injury ruled out. Condition all improved.  Renal function still relatively stable.   Chronic diastolic congestive heart failure. Still stable, no volume overload.  Likely essential hypertension. Blood pressure running high for the last couple days.  Per patient, her blood pressure has always been low at home, I have discontinued midodrine .  Increased blood pressure could be just because hospital environment.  Patient can be followed up with PCP as outpatient.  If blood pressure continue to run high, may start a blood pressure medicine.       Consultants: ID Procedures performed: None  Disposition: Home Diet recommendation:  Discharge Diet Orders (From admission, onward)     Start     Ordered   11/04/23 0000  Diet Carb Modified        11/04/23 1010  Carb modified diet DISCHARGE MEDICATION: Allergies as of 11/04/2023   No Known Allergies      Medication List     STOP taking these medications    midodrine  2.5 MG tablet Commonly known as:  PROAMATINE    ondansetron  4 MG disintegrating tablet Commonly known as: ZOFRAN -ODT       TAKE these medications    acetaminophen  500 MG tablet Commonly known as: TYLENOL  Take 2 tablets (1,000 mg total) by mouth every 6 (six) hours as needed for headache, fever or moderate pain.   Baqsimi One Pack 3 MG/DOSE Powd Generic drug: Glucagon Place 3 mg into the nose as needed.   Blood Pressure Cuff Misc Use as directed   cholecalciferol  25 MCG (1000 UNIT) tablet Commonly known as: VITAMIN D3 VITAMIN D3 25 MCG (1000 UT) TABS   Dexcom G7 Sensor Misc SMARTSIG:1 Each Every 10 Days   divalproex  250 MG DR tablet Commonly known as: DEPAKOTE  Take 250 mg by mouth daily.   hydrocortisone  10 MG tablet Commonly known as: CORTEF  Take 2 tablets (20 mg total) by mouth daily with breakfast AND 1 tablet (10 mg total) daily with supper.   insulin  aspart 100 UNIT/ML FlexPen Commonly known as: NOVOLOG  Inject 2 Units into the skin 3 (three) times daily with meals. If eating and Blood Glucose (BG) 80 or higher inject 2 units for meal coverage and add correction dose per scale. If not eating, correction dose only. BG <150= 0 unit; BG 150-200= 1 unit; BG 201-250= 3 unit; BG 251-300= 5 unit; BG 301-350= 7 unit; BG 351-400= 9 unit; BG >400= 11 unit and Call Primary Care.   insulin  glargine 100 UNIT/ML Solostar Pen Commonly known as: LANTUS  Inject 10 Units into the skin daily.   levothyroxine  75 MCG tablet Commonly known as: SYNTHROID  TAKE 1 TABLET (75 MCG TOTAL) BY MOUTH DAILY AT 6 (SIX) AM.   Pancrelipase  (Lip-Prot-Amyl) 24000-76000 units Cpep Take 1 capsule (24,000 Units total) by mouth 3 (three) times daily before meals.   Pen Needles 31G X 6 MM Misc PEN NEEDLES 31G X 6 MM   BD Pen Needle Mini Ultrafine 31G X 5 MM Misc Generic drug: Insulin  Pen Needle by Other route.   saccharomyces boulardii 250 MG capsule Commonly known as: FLORASTOR Take 250 mg by mouth 2 (two) times daily.   sodium  bicarbonate 650 MG tablet Take 1 tablet (650 mg total) by mouth 2 (two) times daily for 3 days.   Vitamin D  (Ergocalciferol ) 1.25 MG (50000 UNIT) Caps capsule Commonly known as: DRISDOL  Take 50,000 Units by mouth once a week. (Sunday)        Follow-up Information     Ostwalt, [name], PA-C Follow up in 1 week(s).   Specialty: Physician Assistant Contact information: [address] [place] [address] [phone]                Discharge Exam: There were no vitals filed for this visit. General exam: Appears calm and comfortable  Respiratory system: Clear to auscultation. Respiratory effort normal. Cardiovascular system: S1 & S2 heard, RRR. No JVD, murmurs, rubs, gallops or clicks. No pedal edema. Gastrointestinal system: Abdomen is nondistended, soft and nontender. No organomegaly or masses felt. Normal bowel sounds heard. Central nervous system: Alert and oriented. No focal neurological deficits. Extremities: Symmetric 5 x 5 power. Skin: No rashes, lesions or ulcers Psychiatry: Judgement and insight appear normal. Mood & affect appropriate.    Condition at discharge: good  The results of significant diagnostics  from this hospitalization (including imaging, microbiology, ancillary and laboratory) are listed below for reference.   Imaging Studies: ECHOCARDIOGRAM COMPLETE Result Date: 11/03/2023    ECHOCARDIOGRAM REPORT   Patient Name:   Ashley Camacho Date of Exam: 11/02/2023 Medical Rec #:  [id number]       Height:       61.0 in Accession #:    [id number]      Weight:       108.0 lb Date of Birth:  1990-11-10      BSA:          1.454 m Patient Age:    32 years        BP:           162/102 mmHg Patient Gender: F               HR:           63 bpm. Exam Location:  [facility] Procedure: 2D Echo, 3D Echo, Cardiac Doppler, Color Doppler and Strain Analysis            (Both Spectral and Color Flow Doppler were utilized during            procedure). Indications:     R78.81  Bacteremia  History:         Patient has prior history of Echocardiogram examinations, most                  recent 07/20/2023. CHF; Risk Factors:Hypertension and Diabetes.  Sonographer:     Brigid Canada RDCS Referring Phys:  [id number] DAVID P FITZGERALD Diagnosing Phys: Antionette Kirks MD IMPRESSIONS  1. Left ventricular ejection fraction, by estimation, is 55 to 60%. The left ventricle has normal function. The left ventricle has no regional wall motion abnormalities. Left ventricular diastolic parameters were normal. The average left ventricular global longitudinal strain is -23.4 %. The global longitudinal strain is normal.  2. Right ventricular systolic function is normal. The right ventricular size is normal. There is normal pulmonary artery systolic pressure.  3. The mitral valve is normal in structure. Mild to moderate mitral valve regurgitation. No evidence of mitral stenosis.  4. The aortic valve is normal in structure. Aortic valve regurgitation is not visualized. No aortic stenosis is present.  5. The inferior vena cava is normal in size with greater than 50% respiratory variability, suggesting right atrial pressure of 3 mmHg. Conclusion(s)/Recommendation(s): No evidence of valvular vegetations on this transthoracic echocardiogram. FINDINGS  Left Ventricle: Left ventricular ejection fraction, by estimation, is 55 to 60%. The left ventricle has normal function. The left ventricle has no regional wall motion abnormalities. The average left ventricular global longitudinal strain is -23.4 %. Strain was performed and the global longitudinal strain is normal. The left ventricular internal cavity size was normal in size. There is no left ventricular hypertrophy. Left ventricular diastolic parameters were normal. Right Ventricle: The right ventricular size is normal. No increase in right ventricular wall thickness. Right ventricular systolic function is normal. There is normal pulmonary artery systolic  pressure. The tricuspid regurgitant velocity is 2.54 m/s, and  with an assumed right atrial pressure of 3 mmHg, the estimated right ventricular systolic pressure is 28.8 mmHg. Left Atrium: Left atrial size was normal in size. Right Atrium: Right atrial size was normal in size. Pericardium: There is no evidence of pericardial effusion. Mitral Valve: The mitral valve is normal in structure. Mild to moderate mitral valve regurgitation. No evidence of mitral valve stenosis. Tricuspid Valve: The  tricuspid valve is normal in structure. Tricuspid valve regurgitation is mild . No evidence of tricuspid stenosis. Aortic Valve: The aortic valve is normal in structure. Aortic valve regurgitation is not visualized. No aortic stenosis is present. Pulmonic Valve: The pulmonic valve was normal in structure. Pulmonic valve regurgitation is mild. No evidence of pulmonic stenosis. Aorta: The aortic root is normal in size and structure. Venous: The inferior vena cava is normal in size with greater than 50% respiratory variability, suggesting right atrial pressure of 3 mmHg. IAS/Shunts: No atrial level shunt detected by color flow Doppler. Additional Comments: 3D was performed not requiring image post processing on an independent workstation and was normal.  LEFT VENTRICLE PLAX 2D LVIDd:         4.60 cm   Diastology LVIDs:         3.20 cm   LV e' medial:    9.51 cm/s LV PW:         0.60 cm   LV E/e' medial:  12.9 LV IVS:        0.50 cm   LV e' lateral:   14.63 cm/s LVOT diam:     1.60 cm   LV E/e' lateral: 8.4 LV SV:         49 LV SV Index:   33        2D Longitudinal Strain LVOT Area:     2.01 cm  2D Strain GLS (A4C):   -24.5 %                          2D Strain GLS (A3C):   -22.1 %                          2D Strain GLS (A2C):   -23.5 %                          2D Strain GLS Avg:     -23.4 %                           3D Volume EF:                          3D EF:        55 %                          LV EDV:       131 ml                           LV ESV:       59 ml                          LV SV:        71 ml RIGHT VENTRICLE             IVC RV Basal diam:  3.30 cm     IVC diam: 1.10 cm RV S prime:     11.85 cm/s TAPSE (M-mode): 2.4 cm LEFT ATRIUM             Index        RIGHT ATRIUM  Index LA diam:        4.10 cm 2.82 cm/m   RA Area:     12.70 cm LA Vol (A2C):   44.0 ml 30.27 ml/m  RA Volume:   33.10 ml  22.77 ml/m LA Vol (A4C):   39.3 ml 27.04 ml/m LA Biplane Vol: 42.9 ml 29.51 ml/m  AORTIC VALVE LVOT Vmax:   98.50 cm/s LVOT Vmean:  65.750 cm/s LVOT VTI:    0.242 m  AORTA Ao Root diam: 2.70 cm Ao Asc diam:  2.80 cm MITRAL VALVE                TRICUSPID VALVE MV Area (PHT): 4.58 cm     TR Peak grad:   25.8 mmHg MV Decel Time: 166 msec     TR Vmax:        254.00 cm/s MV E velocity: 122.50 cm/s MV A velocity: 95.30 cm/s   SHUNTS MV E/A ratio:  1.29         Systemic VTI:  0.24 m                             Systemic Diam: 1.60 cm Antionette Kirks MD Electronically signed by Antionette Kirks MD Signature Date/Time: 11/03/2023/7:45:54 AM    Final    CT ABDOMEN PELVIS W CONTRAST Result Date: 10/31/2023 CLINICAL DATA:  Bowel obstruction suspected EXAM: CT ABDOMEN AND PELVIS WITH CONTRAST TECHNIQUE: Multidetector CT imaging of the abdomen and pelvis was performed using the standard protocol following bolus administration of intravenous contrast. RADIATION DOSE REDUCTION: This exam was performed according to the departmental dose-optimization program which includes automated exposure control, adjustment of the mA and/or kV according to patient size and/or use of iterative reconstruction technique. CONTRAST:  75mL OMNIPAQUE  IOHEXOL  300 MG/ML  SOLN COMPARISON:  07/23/2023 FINDINGS: Lower Chest: Normal. Hepatobiliary: Normal hepatic contours. No intra- or extrahepatic biliary dilatation. The gallbladder is normal. Pancreas: Normal pancreas. No ductal dilatation or peripancreatic fluid collection. Spleen: Normal. Adrenals/Urinary Tract: The  adrenal glands are normal. No hydronephrosis, nephroureterolithiasis or solid renal mass. Mild wall thickening, possibly exaggerated by underdistention. Stomach/Bowel: There is no hiatal hernia. Normal duodenal course and caliber. No small bowel dilatation or inflammation. No focal colonic abnormality. The appendix is not visualized. No right lower quadrant inflammation or free fluid. Vascular/Lymphatic: Normal course and caliber of the major abdominal vessels. No abdominal or pelvic lymphadenopathy. Reproductive: Normal uterus. No adnexal mass. Other: None. Musculoskeletal: No bony spinal canal stenosis or focal osseous abnormality. IMPRESSION: 1. No acute abnormality of the abdomen or pelvis. 2. Mild wall thickening of the urinary bladder, possibly exaggerated by underdistention. Correlate with urinalysis to exclude cystitis. Electronically Signed   By: Juanetta Nordmann M.D.   On: 10/31/2023 04:24   DG Chest Port 1 View Result Date: 10/31/2023 CLINICAL DATA:  Sepsis EXAM: PORTABLE CHEST 1 VIEW COMPARISON:  05/26/2023 FINDINGS: The heart size and mediastinal contours are within normal limits. Both lungs are clear. The visualized skeletal structures are unremarkable. IMPRESSION: No active disease. Electronically Signed   By: Juanetta Nordmann M.D.   On: 10/31/2023 03:18   CARDIAC CATHETERIZATION Result Date: 10/10/2023 See surgical note for result.   Microbiology: Results for orders placed or performed during the hospital encounter of 10/31/23  Culture, blood (Routine x 2)     Status: Abnormal   Collection Time: 10/31/23  2:00 AM   Specimen: BLOOD  Result Value Ref Range Status  Specimen Description   Final    BLOOD LEFT HAND Performed at [facility], [address] [place], [place] [address]    Special Requests   Final    BOTTLES DRAWN AEROBIC AND ANAEROBIC Blood Culture results may not be optimal due to an inadequate volume of blood received in culture bottles Performed at [facility], [address] [place], [place] [address]    Culture  Setup Time   Final    GRAM POSITIVE COCCI ANAEROBIC BOTTLE ONLY CRITICAL RESULT CALLED TO, READ BACK BY AND VERIFIED WITH: ALEX CHAPPELL 2105 10/31/23 LRL    Culture ENTEROCOCCUS AVIUM (A)  Final   Report Status 11/03/2023 FINAL  Final   Organism ID, Bacteria ENTEROCOCCUS AVIUM  Final      Susceptibility   Enterococcus avium - MIC*    AMPICILLIN  <=2 SENSITIVE Sensitive     VANCOMYCIN  <=0.5 SENSITIVE Sensitive     GENTAMICIN SYNERGY SENSITIVE Sensitive     * ENTEROCOCCUS AVIUM  Blood Culture ID Panel (Reflexed)     Status: None   Collection Time: 10/31/23  2:00 AM  Result Value Ref Range Status   Enterococcus faecalis NOT DETECTED NOT DETECTED Final   Enterococcus Faecium NOT DETECTED NOT DETECTED Final   Listeria monocytogenes NOT DETECTED NOT DETECTED Final   Staphylococcus species NOT DETECTED NOT DETECTED Final   Staphylococcus aureus (BCID) NOT DETECTED NOT DETECTED Final   Staphylococcus epidermidis NOT DETECTED NOT DETECTED Final   Staphylococcus lugdunensis NOT DETECTED NOT DETECTED Final   Streptococcus species NOT DETECTED NOT DETECTED Final   Streptococcus agalactiae NOT DETECTED NOT DETECTED Final   Streptococcus pneumoniae NOT DETECTED NOT DETECTED Final   Streptococcus pyogenes NOT DETECTED NOT DETECTED Final   A.calcoaceticus-baumannii NOT DETECTED NOT DETECTED Final   Bacteroides fragilis NOT DETECTED NOT DETECTED Final   Enterobacterales NOT DETECTED NOT DETECTED Final   Enterobacter cloacae complex NOT DETECTED NOT DETECTED Final   Escherichia coli NOT DETECTED NOT DETECTED Final   Klebsiella aerogenes NOT DETECTED NOT DETECTED Final   Klebsiella oxytoca NOT DETECTED NOT DETECTED Final   Klebsiella pneumoniae NOT DETECTED NOT DETECTED Final   Proteus species NOT DETECTED NOT DETECTED Final   Salmonella species NOT DETECTED NOT DETECTED Final   Serratia marcescens NOT DETECTED NOT DETECTED  Final   Haemophilus influenzae NOT DETECTED NOT DETECTED Final   Neisseria meningitidis NOT DETECTED NOT DETECTED Final   Pseudomonas aeruginosa NOT DETECTED NOT DETECTED Final   Stenotrophomonas maltophilia NOT DETECTED NOT DETECTED Final   Candida albicans NOT DETECTED NOT DETECTED Final   Candida auris NOT DETECTED NOT DETECTED Final   Candida glabrata NOT DETECTED NOT DETECTED Final   Candida krusei NOT DETECTED NOT DETECTED Final   Candida parapsilosis NOT DETECTED NOT DETECTED Final   Candida tropicalis NOT DETECTED NOT DETECTED Final   Cryptococcus neoformans/gattii NOT DETECTED NOT DETECTED Final    Comment: Performed at [facility], [address] [place], [place] [address]  Culture, blood (Routine x 2)     Status: None (Preliminary result)   Collection Time: 10/31/23  2:20 AM   Specimen: BLOOD  Result Value Ref Range Status   Specimen Description BLOOD RIGHT HAND  Final   Special Requests   Final    BOTTLES DRAWN AEROBIC AND ANAEROBIC Blood Culture results may not be optimal due to an inadequate volume of blood received in culture bottles   Culture   Final    NO GROWTH 4 DAYS Performed  at [facility], [address], [place], [place] [address]    Report Status PENDING  Incomplete  Resp panel by RT-PCR (RSV, Flu A&B, Covid) Anterior Nasal Swab     Status: None   Collection Time: 10/31/23  2:22 AM   Specimen: Anterior Nasal Swab  Result Value Ref Range Status   SARS Coronavirus 2 by RT PCR NEGATIVE NEGATIVE Final    Comment: (NOTE) SARS-CoV-2 target nucleic acids are NOT DETECTED.  The SARS-CoV-2 RNA is generally detectable in upper respiratory specimens during the acute phase of infection. The lowest concentration of SARS-CoV-2 viral copies this assay can detect is 138 copies/mL. A negative result does not preclude SARS-Cov-2 infection and should not be used as the sole basis for treatment or other patient management decisions. A negative result  may occur with  improper specimen collection/handling, submission of specimen other than nasopharyngeal swab, presence of viral mutation(s) within the areas targeted by this assay, and inadequate number of viral copies(<138 copies/mL). A negative result must be combined with clinical observations, patient history, and epidemiological information. The expected result is Negative.  Fact Sheet for Patients:  BloggerCourse.com  Fact Sheet for Healthcare Providers:  SeriousBroker.it  This test is no t yet approved or cleared by the [place]  FDA and  has been authorized for detection and/or diagnosis of SARS-CoV-2 by FDA under an Emergency Use Authorization (EUA). This EUA will remain  in effect (meaning this test can be used) for the duration of the COVID-19 declaration under Section 564(b)(1) of the Act, 21 U.S.C.section 360bbb-3(b)(1), unless the authorization is terminated  or revoked sooner.       Influenza A by PCR NEGATIVE NEGATIVE Final   Influenza B by PCR NEGATIVE NEGATIVE Final    Comment: (NOTE) The Xpert Xpress SARS-CoV-2/FLU/RSV plus assay is intended as an aid in the diagnosis of influenza from Nasopharyngeal swab specimens and should not be used as a sole basis for treatment. Nasal washings and aspirates are unacceptable for Xpert Xpress SARS-CoV-2/FLU/RSV testing.  Fact Sheet for Patients: BloggerCourse.com  Fact Sheet for Healthcare Providers: SeriousBroker.it  This test is not yet approved or cleared by the [place]  FDA and has been authorized for detection and/or diagnosis of SARS-CoV-2 by FDA under an Emergency Use Authorization (EUA). This EUA will remain in effect (meaning this test can be used) for the duration of the COVID-19 declaration under Section 564(b)(1) of the Act, 21 U.S.C. section 360bbb-3(b)(1), unless the authorization is terminated  or revoked.     Resp Syncytial Virus by PCR NEGATIVE NEGATIVE Final    Comment: (NOTE) Fact Sheet for Patients: BloggerCourse.com  Fact Sheet for Healthcare Providers: SeriousBroker.it  This test is not yet approved or cleared by the [place]  FDA and has been authorized for detection and/or diagnosis of SARS-CoV-2 by FDA under an Emergency Use Authorization (EUA). This EUA will remain in effect (meaning this test can be used) for the duration of the COVID-19 declaration under Section 564(b)(1) of the Act, 21 U.S.C. section 360bbb-3(b)(1), unless the authorization is terminated or revoked.  Performed at [facility], [address] [place], [place] [address]   C Difficile Quick Screen w PCR reflex     Status: None   Collection Time: 11/01/23  6:50 PM   Specimen: STOOL  Result Value Ref Range Status   C Diff antigen NEGATIVE NEGATIVE Final   C Diff toxin NEGATIVE NEGATIVE Final   C Diff interpretation No C. difficile detected.  Final  Comment: Performed at [facility], [address] [place], [place] [address]  Gastrointestinal Panel by PCR , Stool     Status: Abnormal   Collection Time: 11/01/23  6:50 PM   Specimen: STOOL  Result Value Ref Range Status   Campylobacter species NOT DETECTED NOT DETECTED Final   Plesimonas shigelloides NOT DETECTED NOT DETECTED Final   Salmonella species NOT DETECTED NOT DETECTED Final   Yersinia enterocolitica NOT DETECTED NOT DETECTED Final   Vibrio species NOT DETECTED NOT DETECTED Final   Vibrio cholerae NOT DETECTED NOT DETECTED Final   Enteroaggregative E coli (EAEC) NOT DETECTED NOT DETECTED Final   Enteropathogenic E coli (EPEC) NOT DETECTED NOT DETECTED Final   Enterotoxigenic E coli (ETEC) NOT DETECTED NOT DETECTED Final   Shiga like toxin producing E coli (STEC) NOT DETECTED NOT DETECTED Final   Shigella/Enteroinvasive E coli (EIEC) NOT DETECTED NOT  DETECTED Final   Cryptosporidium NOT DETECTED NOT DETECTED Final   Cyclospora cayetanensis NOT DETECTED NOT DETECTED Final   Entamoeba histolytica NOT DETECTED NOT DETECTED Final   Giardia lamblia NOT DETECTED NOT DETECTED Final   Adenovirus F40/41 NOT DETECTED NOT DETECTED Final   Astrovirus NOT DETECTED NOT DETECTED Final   Norovirus GI/GII DETECTED (A) NOT DETECTED Final    Comment: RESULT CALLED TO, READ BACK BY AND VERIFIED WITH: COURTNEY REAVES 11/01/23 2024 MU    Rotavirus A NOT DETECTED NOT DETECTED Final   Sapovirus (I, II, IV, and V) NOT DETECTED NOT DETECTED Final    Comment: Performed at [facility], [address] [place], [place] [address]  Culture, blood (single) w Reflex to ID Panel     Status: None (Preliminary result)   Collection Time: 11/02/23  2:47 PM   Specimen: BLOOD  Result Value Ref Range Status   Specimen Description BLOOD BLOOD LEFT HAND  Final   Special Requests   Final    BOTTLES DRAWN AEROBIC AND ANAEROBIC Blood Culture adequate volume   Culture   Final    NO GROWTH 2 DAYS Performed at [facility], [address] [place], [place] [address]    Report Status PENDING  Incomplete    Labs: CBC: Recent Labs  Lab 10/31/23 0300 10/31/23 1330 11/01/23 0636 11/02/23 0853  WBC 12.4* 11.0* 6.5 9.6  NEUTROABS 11.2* 10.5*  --   --   HGB 8.4* 9.1* 8.7* 9.1*  HCT 25.6* 26.6* 27.1* 27.6*  MCV 91.8 90.5 92.8 93.2  PLT 125* 135* 140* 168   Basic Metabolic Panel: Recent Labs  Lab 10/31/23 1236 11/01/23 0636 11/02/23 0853 11/03/23 0710 11/04/23 0610  NA 137 144 142 142 143  K 4.4 3.6 3.4* 4.2 3.7  CL 109 114* 111 115* 113*  CO2 15* 19* 20* 19* 23  GLUCOSE 177* 169* 146* 253* 147*  BUN 28* 37* 37* 36* 38*  CREATININE 1.07* 1.54* 1.44* 1.52* 1.69*  CALCIUM  7.0* 7.7* 8.1* 8.3* 8.1*  MG 1.2*  --  2.6* 2.2 1.9  PHOS 1.9*  --  3.9 3.1 2.7   Liver Function Tests: Recent Labs  Lab 10/31/23 0154 10/31/23 1236 11/01/23 0636   AST 23 14* 21  ALT 12 8 12   ALKPHOS 48 25* 36*  BILITOT 0.6 0.3 0.6  PROT 7.3 3.9* 6.2*  ALBUMIN  4.0 2.0* 3.3*   CBG: Recent Labs  Lab 11/03/23 1601 11/03/23 2033 11/03/23 2342 11/04/23 0431 11/04/23 0734  GLUCAP 159* 163* 205* 161* 115*    Discharge time spent: greater than  30 minutes.  Signed: Donaciano Frizzle, MD [facility] 11/04/2023

## 2023-11-05 LAB — CULTURE, BLOOD (ROUTINE X 2): Culture: NO GROWTH

## 2023-11-07 ENCOUNTER — Telehealth: Payer: Self-pay

## 2023-11-07 ENCOUNTER — Ambulatory Visit: Payer: Self-pay | Admitting: Physician Assistant

## 2023-11-07 LAB — CULTURE, BLOOD (SINGLE)
Culture: NO GROWTH
Special Requests: ADEQUATE

## 2023-11-07 NOTE — Transitions of Care (Post Inpatient/ED Visit) (Signed)
   11/07/2023  Name: Ashley Camacho MRN: [id number] DOB: 1990-10-13  Today's TOC FU Call Status: Today's TOC FU Call Status:: Unsuccessful Call (1st Attempt) Unsuccessful Call (1st Attempt) Date: 11/07/23  Attempted to reach the patient regarding the most recent Inpatient/ED visit.  Unable to leave a voicemail message, message box was full.  Follow Up Plan: Additional outreach attempts will be made to reach the patient to complete the Transitions of Care (Post Inpatient/ED visit) call.   Brown Cape, RN, BSN, CCM [facility], [facility] RN Care Manager Direct Dial: [phone]

## 2023-11-08 ENCOUNTER — Telehealth: Payer: Self-pay

## 2023-11-08 NOTE — Transitions of Care (Post Inpatient/ED Visit) (Signed)
   11/08/2023  Name: Ashley Camacho MRN: [id number] DOB: March 02, 1991  Today's TOC FU Call Status: Today's TOC FU Call Status:: Unsuccessful Call (2nd Attempt) Unsuccessful Call (2nd Attempt) Date: 11/08/23  Attempted to reach the patient regarding the most recent Inpatient/ED visit. Answered call but states unable to talk today.   Follow Up Plan: Additional outreach attempts will be made to reach the patient to complete the Transitions of Care (Post Inpatient/ED visit) call.   Brown Cape, RN, BSN, CCM [facility], [facility] Health RN Care Manager Direct Dial: [phone]

## 2023-11-09 ENCOUNTER — Telehealth: Payer: Self-pay

## 2023-11-09 NOTE — Transitions of Care (Post Inpatient/ED Visit) (Signed)
   11/09/2023  Name: Ashley Camacho MRN: [id number] DOB: [birth year]  Today's TOC FU Call Status: Today's TOC FU Call Status:: Unsuccessful Call (3rd Attempt) Unsuccessful Call (3rd Attempt) Date: 11/09/23  Attempted to reach the patient regarding the most recent Inpatient/ED visit. Call returned and  Unable to leave voicemail message for 3rd unsuccessful outreach.  Follow Up Plan: No further outreach attempts will be made at this time. We have been unable to contact the patient.  Brown Cape, RN, BSN, CCM [facility], [facility] Health RN Care Manager Direct Dial: [phone]

## 2023-11-11 ENCOUNTER — Encounter: Payer: Self-pay | Admitting: Physician Assistant

## 2023-11-13 ENCOUNTER — Inpatient Hospital Stay
Admission: EM | Admit: 2023-11-13 | Discharge: 2023-11-16 | DRG: 871 | Disposition: A | Discharge status: Home / Self Care | Attending: Internal Medicine | Admitting: Internal Medicine

## 2023-11-13 ENCOUNTER — Other Ambulatory Visit: Payer: Self-pay

## 2023-11-13 ENCOUNTER — Emergency Department

## 2023-11-13 ENCOUNTER — Encounter: Payer: Self-pay | Admitting: Internal Medicine

## 2023-11-13 DIAGNOSIS — Z8249 Family history of ischemic heart disease and other diseases of the circulatory system: Secondary | ICD-10-CM | POA: Diagnosis not present

## 2023-11-13 DIAGNOSIS — E1043 Type 1 diabetes mellitus with diabetic autonomic (poly)neuropathy: Secondary | ICD-10-CM

## 2023-11-13 DIAGNOSIS — E2740 Unspecified adrenocortical insufficiency: Secondary | ICD-10-CM | POA: Diagnosis present

## 2023-11-13 DIAGNOSIS — I5032 Chronic diastolic (congestive) heart failure: Secondary | ICD-10-CM | POA: Diagnosis present

## 2023-11-13 DIAGNOSIS — Z79899 Other long term (current) drug therapy: Secondary | ICD-10-CM | POA: Diagnosis not present

## 2023-11-13 DIAGNOSIS — Z801 Family history of malignant neoplasm of trachea, bronchus and lung: Secondary | ICD-10-CM

## 2023-11-13 DIAGNOSIS — J188 Other pneumonia, unspecified organism: Secondary | ICD-10-CM | POA: Diagnosis not present

## 2023-11-13 DIAGNOSIS — Z743 Need for continuous supervision: Secondary | ICD-10-CM | POA: Diagnosis not present

## 2023-11-13 DIAGNOSIS — J9601 Acute respiratory failure with hypoxia: Secondary | ICD-10-CM | POA: Diagnosis present

## 2023-11-13 DIAGNOSIS — R059 Cough, unspecified: Secondary | ICD-10-CM | POA: Diagnosis not present

## 2023-11-13 DIAGNOSIS — R1110 Vomiting, unspecified: Secondary | ICD-10-CM

## 2023-11-13 DIAGNOSIS — D631 Anemia in chronic kidney disease: Secondary | ICD-10-CM

## 2023-11-13 DIAGNOSIS — D62 Acute posthemorrhagic anemia: Secondary | ICD-10-CM | POA: Diagnosis present

## 2023-11-13 DIAGNOSIS — E1022 Type 1 diabetes mellitus with diabetic chronic kidney disease: Secondary | ICD-10-CM | POA: Diagnosis present

## 2023-11-13 DIAGNOSIS — E870 Hyperosmolality and hypernatremia: Secondary | ICD-10-CM | POA: Diagnosis present

## 2023-11-13 DIAGNOSIS — D649 Anemia, unspecified: Secondary | ICD-10-CM | POA: Diagnosis not present

## 2023-11-13 DIAGNOSIS — E1069 Type 1 diabetes mellitus with other specified complication: Secondary | ICD-10-CM

## 2023-11-13 DIAGNOSIS — Z1152 Encounter for screening for COVID-19: Secondary | ICD-10-CM | POA: Diagnosis not present

## 2023-11-13 DIAGNOSIS — N189 Chronic kidney disease, unspecified: Secondary | ICD-10-CM

## 2023-11-13 DIAGNOSIS — R000 Tachycardia, unspecified: Secondary | ICD-10-CM | POA: Diagnosis not present

## 2023-11-13 DIAGNOSIS — E109 Type 1 diabetes mellitus without complications: Secondary | ICD-10-CM | POA: Diagnosis present

## 2023-11-13 DIAGNOSIS — J189 Pneumonia, unspecified organism: Secondary | ICD-10-CM | POA: Diagnosis present

## 2023-11-13 DIAGNOSIS — R531 Weakness: Secondary | ICD-10-CM | POA: Diagnosis not present

## 2023-11-13 DIAGNOSIS — N1832 Chronic kidney disease, stage 3b: Secondary | ICD-10-CM | POA: Diagnosis present

## 2023-11-13 DIAGNOSIS — R197 Diarrhea, unspecified: Secondary | ICD-10-CM

## 2023-11-13 DIAGNOSIS — I509 Heart failure, unspecified: Secondary | ICD-10-CM

## 2023-11-13 DIAGNOSIS — Z8674 Personal history of sudden cardiac arrest: Secondary | ICD-10-CM

## 2023-11-13 DIAGNOSIS — R918 Other nonspecific abnormal finding of lung field: Secondary | ICD-10-CM | POA: Diagnosis not present

## 2023-11-13 DIAGNOSIS — E039 Hypothyroidism, unspecified: Secondary | ICD-10-CM | POA: Diagnosis present

## 2023-11-13 DIAGNOSIS — E876 Hypokalemia: Secondary | ICD-10-CM | POA: Diagnosis present

## 2023-11-13 DIAGNOSIS — Z820 Family history of epilepsy and other diseases of the nervous system: Secondary | ICD-10-CM

## 2023-11-13 DIAGNOSIS — E871 Hypo-osmolality and hyponatremia: Secondary | ICD-10-CM | POA: Diagnosis present

## 2023-11-13 DIAGNOSIS — I10 Essential (primary) hypertension: Secondary | ICD-10-CM | POA: Diagnosis present

## 2023-11-13 DIAGNOSIS — R110 Nausea: Secondary | ICD-10-CM | POA: Diagnosis not present

## 2023-11-13 DIAGNOSIS — K3184 Gastroparesis: Secondary | ICD-10-CM | POA: Diagnosis present

## 2023-11-13 DIAGNOSIS — E440 Moderate protein-calorie malnutrition: Secondary | ICD-10-CM | POA: Diagnosis present

## 2023-11-13 DIAGNOSIS — Z794 Long term (current) use of insulin: Secondary | ICD-10-CM | POA: Diagnosis not present

## 2023-11-13 DIAGNOSIS — Z934 Other artificial openings of gastrointestinal tract status: Secondary | ICD-10-CM

## 2023-11-13 DIAGNOSIS — Z79890 Hormone replacement therapy (postmenopausal): Secondary | ICD-10-CM

## 2023-11-13 DIAGNOSIS — R6520 Severe sepsis without septic shock: Secondary | ICD-10-CM | POA: Diagnosis not present

## 2023-11-13 DIAGNOSIS — A419 Sepsis, unspecified organism: Secondary | ICD-10-CM | POA: Diagnosis present

## 2023-11-13 DIAGNOSIS — I130 Hypertensive heart and chronic kidney disease with heart failure and stage 1 through stage 4 chronic kidney disease, or unspecified chronic kidney disease: Secondary | ICD-10-CM | POA: Diagnosis present

## 2023-11-13 DIAGNOSIS — N183 Chronic kidney disease, stage 3 unspecified: Secondary | ICD-10-CM | POA: Diagnosis present

## 2023-11-13 DIAGNOSIS — R0689 Other abnormalities of breathing: Secondary | ICD-10-CM | POA: Diagnosis not present

## 2023-11-13 DIAGNOSIS — Z833 Family history of diabetes mellitus: Secondary | ICD-10-CM

## 2023-11-13 DIAGNOSIS — R0902 Hypoxemia: Secondary | ICD-10-CM | POA: Diagnosis not present

## 2023-11-13 DIAGNOSIS — J984 Other disorders of lung: Secondary | ICD-10-CM | POA: Diagnosis not present

## 2023-11-13 DIAGNOSIS — N179 Acute kidney failure, unspecified: Secondary | ICD-10-CM | POA: Diagnosis present

## 2023-11-13 DIAGNOSIS — Z803 Family history of malignant neoplasm of breast: Secondary | ICD-10-CM

## 2023-11-13 LAB — CBC WITH DIFFERENTIAL/PLATELET
Abs Immature Granulocytes: 0.07 10*3/uL (ref 0.00–0.07)
Basophils Absolute: 0 10*3/uL (ref 0.0–0.1)
Basophils Relative: 0 %
Eosinophils Absolute: 0.1 10*3/uL (ref 0.0–0.5)
Eosinophils Relative: 1 %
HCT: 14.8 % — CL (ref 36.0–46.0)
Hemoglobin: 4.8 g/dL — CL (ref 12.0–15.0)
Immature Granulocytes: 0 %
Lymphocytes Relative: 9 %
Lymphs Abs: 1.6 10*3/uL (ref 0.7–4.0)
MCH: 30.8 pg (ref 26.0–34.0)
MCHC: 32.4 g/dL (ref 30.0–36.0)
MCV: 94.9 fL (ref 80.0–100.0)
Monocytes Absolute: 0.9 10*3/uL (ref 0.1–1.0)
Monocytes Relative: 5 %
Neutro Abs: 14.2 10*3/uL — ABNORMAL HIGH (ref 1.7–7.7)
Neutrophils Relative %: 85 %
Platelets: 158 10*3/uL (ref 150–400)
RBC: 1.56 MIL/uL — ABNORMAL LOW (ref 3.87–5.11)
RDW: 14.4 % (ref 11.5–15.5)
WBC: 16.9 10*3/uL — ABNORMAL HIGH (ref 4.0–10.5)
nRBC: 0 % (ref 0.0–0.2)

## 2023-11-13 LAB — COMPREHENSIVE METABOLIC PANEL WITH GFR
ALT: 8 U/L (ref 0–44)
AST: 16 U/L (ref 15–41)
Albumin: 2.8 g/dL — ABNORMAL LOW (ref 3.5–5.0)
Alkaline Phosphatase: 28 U/L — ABNORMAL LOW (ref 38–126)
Anion gap: 9 (ref 5–15)
BUN: 32 mg/dL — ABNORMAL HIGH (ref 6–20)
CO2: 25 mmol/L (ref 22–32)
Calcium: 7.6 mg/dL — ABNORMAL LOW (ref 8.9–10.3)
Chloride: 112 mmol/L — ABNORMAL HIGH (ref 98–111)
Creatinine, Ser: 2.25 mg/dL — ABNORMAL HIGH (ref 0.44–1.00)
GFR, Estimated: 29 mL/min — ABNORMAL LOW (ref >60)
Glucose, Bld: 108 mg/dL — ABNORMAL HIGH (ref 70–99)
Potassium: 3.4 mmol/L — ABNORMAL LOW (ref 3.5–5.1)
Sodium: 146 mmol/L — ABNORMAL HIGH (ref 135–145)
Total Bilirubin: 0.5 mg/dL (ref 0.0–1.2)
Total Protein: 5 g/dL — ABNORMAL LOW (ref 6.5–8.1)

## 2023-11-13 LAB — URINALYSIS, W/ REFLEX TO CULTURE (INFECTION SUSPECTED)
Bilirubin Urine: NEGATIVE
Glucose, UA: NEGATIVE mg/dL
Ketones, ur: NEGATIVE mg/dL
Nitrite: NEGATIVE
Protein, ur: >=300 mg/dL — AB
Specific Gravity, Urine: 1.011 (ref 1.005–1.030)
pH: 7 (ref 5.0–8.0)

## 2023-11-13 LAB — CBG MONITORING, ED
Glucose-Capillary: 119 mg/dL — ABNORMAL HIGH (ref 70–99)
Glucose-Capillary: 37 mg/dL — CL (ref 70–99)
Glucose-Capillary: 132 mg/dL — ABNORMAL HIGH (ref 70–99)
Glucose-Capillary: 177 mg/dL — ABNORMAL HIGH (ref 70–99)

## 2023-11-13 LAB — RESP PANEL BY RT-PCR (RSV, FLU A&B, COVID)  RVPGX2
Influenza A by PCR: NEGATIVE
Influenza B by PCR: NEGATIVE
Resp Syncytial Virus by PCR: NEGATIVE
SARS Coronavirus 2 by RT PCR: NEGATIVE

## 2023-11-13 LAB — PROTIME-INR
INR: 1.3 — ABNORMAL HIGH (ref 0.8–1.2)
Prothrombin Time: 15.9 s — ABNORMAL HIGH (ref 11.4–15.2)

## 2023-11-13 LAB — GLUCOSE, CAPILLARY
Glucose-Capillary: 169 mg/dL — ABNORMAL HIGH (ref 70–99)
Glucose-Capillary: 148 mg/dL — ABNORMAL HIGH (ref 70–99)

## 2023-11-13 LAB — LACTIC ACID, PLASMA: Lactic Acid, Venous: 1.3 mmol/L (ref 0.5–1.9)

## 2023-11-13 LAB — MRSA NEXT GEN BY PCR, NASAL: MRSA by PCR Next Gen: NOT DETECTED

## 2023-11-13 LAB — PHOSPHORUS: Phosphorus: 2.4 mg/dL — ABNORMAL LOW (ref 2.5–4.6)

## 2023-11-13 LAB — PREPARE RBC (CROSSMATCH)

## 2023-11-13 LAB — MAGNESIUM: Magnesium: 1.4 mg/dL — ABNORMAL LOW (ref 1.7–2.4)

## 2023-11-13 MED ORDER — PANTOPRAZOLE SODIUM 40 MG IV SOLR
40.0000 mg | Freq: Once | INTRAVENOUS | Status: AC
  Administered 2023-11-13: 40 mg via INTRAVENOUS
  Filled 2023-11-13: qty 10

## 2023-11-13 MED ORDER — CHLORHEXIDINE GLUCONATE CLOTH 2 % EX PADS
6.0000 | MEDICATED_PAD | Freq: Every day | CUTANEOUS | Status: DC
  Administered 2023-11-13 – 2023-11-14 (×2): 6 via TOPICAL
  Filled 2023-11-13: qty 6

## 2023-11-13 MED ORDER — METRONIDAZOLE 500 MG/100ML IV SOLN
500.0000 mg | Freq: Once | INTRAVENOUS | Status: AC
  Administered 2023-11-13: 500 mg via INTRAVENOUS
  Filled 2023-11-13: qty 100

## 2023-11-13 MED ORDER — ONDANSETRON HCL 4 MG PO TABS: 4.0000 mg | ORAL_TABLET | Freq: Four times a day (QID) | ORAL | Status: DC | PRN

## 2023-11-13 MED ORDER — VANCOMYCIN HCL 2000 MG/400ML IV SOLN
2000.0000 mg | Freq: Once | INTRAVENOUS | Status: AC
  Administered 2023-11-13: 2000 mg via INTRAVENOUS
  Filled 2023-11-13: qty 400

## 2023-11-13 MED ORDER — VANCOMYCIN HCL IN DEXTROSE 1-5 GM/200ML-% IV SOLN
1000.0000 mg | Freq: Once | INTRAVENOUS | Status: DC
  Filled 2023-11-13: qty 200

## 2023-11-13 MED ORDER — DEXTROSE 50 % IV SOLN
INTRAVENOUS | Status: AC
  Filled 2023-11-13: qty 50

## 2023-11-13 MED ORDER — ONDANSETRON HCL 4 MG/2ML IJ SOLN
4.0000 mg | Freq: Once | INTRAMUSCULAR | Status: AC
  Administered 2023-11-13: 4 mg via INTRAVENOUS

## 2023-11-13 MED ORDER — VANCOMYCIN VARIABLE DOSE PER UNSTABLE RENAL FUNCTION (PHARMACIST DOSING): Status: DC

## 2023-11-13 MED ORDER — ACETAMINOPHEN 500 MG PO TABS
1000.0000 mg | ORAL_TABLET | Freq: Once | ORAL | Status: AC
  Administered 2023-11-13: 1000 mg via ORAL
  Filled 2023-11-13: qty 2

## 2023-11-13 MED ORDER — ONDANSETRON HCL 4 MG/2ML IJ SOLN
4.0000 mg | Freq: Four times a day (QID) | INTRAMUSCULAR | Status: DC | PRN
  Administered 2023-11-15: 4 mg via INTRAVENOUS
  Filled 2023-11-13: qty 2

## 2023-11-13 MED ORDER — ACETAMINOPHEN 650 MG RE SUPP: 650.0000 mg | Freq: Four times a day (QID) | RECTAL | Status: DC | PRN

## 2023-11-13 MED ORDER — SODIUM CHLORIDE 0.9 % IV SOLN
2.0000 g | INTRAVENOUS | Status: DC
  Administered 2023-11-14 – 2023-11-16 (×3): 2 g via INTRAVENOUS
  Filled 2023-11-13 (×3): qty 12.5

## 2023-11-13 MED ORDER — MAGNESIUM SULFATE 2 GM/50ML IV SOLN
2.0000 g | Freq: Once | INTRAVENOUS | Status: AC
  Administered 2023-11-13: 2 g via INTRAVENOUS
  Filled 2023-11-13: qty 50

## 2023-11-13 MED ORDER — DEXTROSE 50 % IV SOLN: 25.0000 g | INTRAVENOUS | Status: DC | PRN

## 2023-11-13 MED ORDER — LEVOTHYROXINE SODIUM 50 MCG PO TABS
75.0000 ug | ORAL_TABLET | Freq: Every day | ORAL | Status: DC
  Administered 2023-11-14 – 2023-11-16 (×3): 75 ug via ORAL
  Filled 2023-11-13 (×3): qty 2

## 2023-11-13 MED ORDER — K PHOS MONO-SOD PHOS DI & MONO 155-852-130 MG PO TABS
250.0000 mg | ORAL_TABLET | Freq: Four times a day (QID) | ORAL | Status: AC
  Administered 2023-11-13 – 2023-11-14 (×4): 250 mg via ORAL
  Filled 2023-11-13 (×4): qty 1

## 2023-11-13 MED ORDER — DEXTROSE 50 % IV SOLN
25.0000 mL | Freq: Once | INTRAVENOUS | Status: AC
  Administered 2023-11-13: 25 mL via INTRAVENOUS

## 2023-11-13 MED ORDER — ONDANSETRON HCL 4 MG/2ML IJ SOLN
INTRAMUSCULAR | Status: AC
  Filled 2023-11-13: qty 2

## 2023-11-13 MED ORDER — PANTOPRAZOLE SODIUM 40 MG IV SOLR
40.0000 mg | Freq: Two times a day (BID) | INTRAVENOUS | Status: DC
  Administered 2023-11-13 – 2023-11-16 (×6): 40 mg via INTRAVENOUS
  Filled 2023-11-13 (×6): qty 10

## 2023-11-13 MED ORDER — LACTATED RINGERS IV BOLUS
1000.0000 mL | Freq: Once | INTRAVENOUS | Status: AC
  Administered 2023-11-13: 1000 mL via INTRAVENOUS

## 2023-11-13 MED ORDER — SODIUM CHLORIDE 0.9 % IV SOLN
2.0000 g | Freq: Once | INTRAVENOUS | Status: AC
  Administered 2023-11-13: 2 g via INTRAVENOUS
  Filled 2023-11-13: qty 12.5

## 2023-11-13 MED ORDER — DIVALPROEX SODIUM 250 MG PO DR TAB
250.0000 mg | DELAYED_RELEASE_TABLET | Freq: Every day | ORAL | Status: DC
  Administered 2023-11-13 – 2023-11-16 (×4): 250 mg via ORAL
  Filled 2023-11-13 (×4): qty 1

## 2023-11-13 MED ORDER — ACETAMINOPHEN 325 MG PO TABS
650.0000 mg | ORAL_TABLET | Freq: Four times a day (QID) | ORAL | Status: DC | PRN
  Administered 2023-11-14: 650 mg via ORAL
  Filled 2023-11-13: qty 2

## 2023-11-13 MED ORDER — INSULIN GLARGINE-YFGN 100 UNIT/ML ~~LOC~~ SOLN
10.0000 [IU] | Freq: Every day | SUBCUTANEOUS | Status: DC
  Administered 2023-11-14 – 2023-11-15 (×2): 10 [IU] via SUBCUTANEOUS
  Filled 2023-11-13 (×5): qty 0.1

## 2023-11-13 NOTE — Consult Note (Addendum)
 Pharmacy Antibiotic Note  Ashley Camacho is a 33 y.o. female admitted on 11/13/2023 with pneumonia. Pharmacy has been consulted for vancomycin  & cefepime  dosing.  Today, 11/13/2023:  Day 1 of vancomycin  & cefepime   WBC 16.9 Tmax 103.2  SCr 2.25 today; baseline appears to be ~ 1.5  CXR shows multifocal airspace disease compatible with pneumonia  Blood cultures have been collected and sent  MRSA nares ordered  Has received one time doses of cefepime  2 gm & metronidazole  500 mg   Plan:  Give a loading dose of vancomycin  2 gm IV x 1 dose F/u renal function tomorrow morning to determine if need to dose per level vs ordering a scheduled regimen  Start cefepime  2 gm IV Q24H based on current renal function  Pharmacy will continue to monitor and dose adjust appropriately  Temp (24hrs), Avg:103.2 F (39.6 C), Min:103.2 F (39.6 C), Max:103.2 F (39.6 C)  Recent Labs  Lab 11/13/23 1145  WBC 16.9*  CREATININE 2.25*  LATICACIDVEN 1.3    CrCl cannot be calculated (Unknown ideal weight.).    No Known Allergies  Antimicrobials this admission: 6/22 metronidazole  x 1 dose  6/22 vancomycin  >>  6/22 cefepime  >>   Dose adjustments this admission:  Microbiology results: 6/22 BCx: sent  6/22 MRSA nares: ordered   Thank you for allowing pharmacy to be a part of this patient's care.  Loyalty Arentz, PharmD Clinical Pharmacist  11/13/2023 2:31 PM

## 2023-11-13 NOTE — ED Provider Notes (Signed)
 [facility] Provider Note    Event Date/Time   First MD Initiated Contact with Patient 11/13/23 1133     (approximate)   History   Chief Complaint Weakness   HPI  Ashley Camacho is a 33 y.o. female with past medical history of hypertension, diabetes, hypothyroidism, adrenal insufficiency, diastolic CHF, anemia, CKD, and gastroparesis who presents to the ED complaining of weakness.  Patient reports that she has been feeling increasingly weak in general over the past couple of days with a cough and some difficulty breathing.  She also reports nausea, vomiting, and diarrhea, but has not noticed any blood in her stool.  She denies any pain in her chest or her abdomen.  She does report some subjective chills, but has not felt feverish until today.  EMS reports a temp of 103 axillary, patient also reportedly hypoglycemic at home but CBG within normal limits with EMS.     Physical Exam   Triage Vital Signs: ED Triage Vitals  Encounter Vitals Group     BP 11/13/23 1140 (!) 150/78     Girls Systolic BP Percentile --      Girls Diastolic BP Percentile --      Boys Systolic BP Percentile --      Boys Diastolic BP Percentile --      Pulse Rate 11/13/23 1140 (!) 138     Resp 11/13/23 1140 16     Temp 11/13/23 1142 (!) 103.2 F (39.6 C)     Temp Source 11/13/23 1142 Oral     SpO2 11/13/23 1140 (!) 76 %     Weight --      Height --      Head Circumference --      Peak Flow --      Pain Score 11/13/23 1138 0     Pain Loc --      Pain Education --      Exclude from Growth Chart --     Most recent vital signs: Vitals:   11/13/23 1400 11/13/23 1510  BP: (!) 103/58   Pulse: (!) 116   Resp: (!) 33   Temp:  99.2 F (37.3 C)  SpO2: 99%     Constitutional: Alert and oriented. Eyes: Conjunctivae are normal. Head: Atraumatic. Nose: No congestion/rhinnorhea. Mouth/Throat: Mucous membranes dry. Cardiovascular: Tachycardic, regular rhythm. Grossly normal  heart sounds.  2+ radial pulses bilaterally. Respiratory: Normal respiratory effort.  No retractions. Lungs CTAB. Gastrointestinal: Soft and nontender. No distention. Musculoskeletal: No lower extremity tenderness nor edema.  Neurologic:  Normal speech and language.  Global weakness noted with no gross focal neurologic deficits appreciated.    ED Results / Procedures / Treatments   Labs (all labs ordered are listed, but only abnormal results are displayed) Labs Reviewed  COMPREHENSIVE METABOLIC PANEL WITH GFR - Abnormal; Notable for the following components:      Result Value   Sodium 146 (*)    Potassium 3.4 (*)    Chloride 112 (*)    Glucose, Bld 108 (*)    BUN 32 (*)    Creatinine, Ser 2.25 (*)    Calcium  7.6 (*)    Total Protein 5.0 (*)    Albumin  2.8 (*)    Alkaline Phosphatase 28 (*)    GFR, Estimated 29 (*)    All other components within normal limits  CBC WITH DIFFERENTIAL/PLATELET - Abnormal; Notable for the following components:   WBC 16.9 (*)    RBC 1.56 (*)  Hemoglobin 4.8 (*)    HCT 14.8 (*)    Neutro Abs 14.2 (*)    All other components within normal limits  PROTIME-INR - Abnormal; Notable for the following components:   Prothrombin Time 15.9 (*)    INR 1.3 (*)    All other components within normal limits  MAGNESIUM  - Abnormal; Notable for the following components:   Magnesium  1.4 (*)    All other components within normal limits  PHOSPHORUS - Abnormal; Notable for the following components:   Phosphorus 2.4 (*)    All other components within normal limits  RESP PANEL BY RT-PCR (RSV, FLU A&B, COVID)  RVPGX2  CULTURE, BLOOD (ROUTINE X 2)  CULTURE, BLOOD (ROUTINE X 2)  MRSA NEXT GEN BY PCR, NASAL  LACTIC ACID, PLASMA  URINALYSIS, W/ REFLEX TO CULTURE (INFECTION SUSPECTED)  HEMOGLOBIN AND HEMATOCRIT, BLOOD  HEMOGLOBIN AND HEMATOCRIT, BLOOD  PREPARE RBC (CROSSMATCH)  TYPE AND SCREEN     EKG  ED ECG REPORT I, Carlin Palin, the attending  physician, personally viewed and interpreted this ECG.   Date: 11/13/2023  EKG Time: 11:41  Rate: 135  Rhythm: sinus tachycardia  Axis: Normal  Intervals:none  ST&T Change: None  RADIOLOGY Chest x-ray reviewed and interpreted by me, concerning for multifocal pneumonia.  PROCEDURES:  Critical Care performed: Yes, see critical care procedure note(s)  .Critical Care  Performed by: Palin Carlin, MD Authorized by: Palin Carlin, MD   Critical care provider statement:    Critical care time (minutes):  30   Critical care time was exclusive of:  Separately billable procedures and treating other patients and teaching time   Critical care was necessary to treat or prevent imminent or life-threatening deterioration of the following conditions:  Respiratory failure and sepsis   Critical care was time spent personally by me on the following activities:  Development of treatment plan with patient or surrogate, discussions with consultants, evaluation of patient's response to treatment, examination of patient, ordering and review of laboratory studies, ordering and review of radiographic studies, ordering and performing treatments and interventions, pulse oximetry, re-evaluation of patient's condition and review of old charts   I assumed direction of critical care for this patient from another provider in my specialty: no     Care discussed with: admitting provider      MEDICATIONS ORDERED IN ED: Medications  magnesium  sulfate IVPB 2 g 50 mL (2 g Intravenous New Bag/Given 11/13/23 1505)  pantoprazole  (PROTONIX ) injection 40 mg (has no administration in time range)  acetaminophen  (TYLENOL ) tablet 650 mg (has no administration in time range)    Or  acetaminophen  (TYLENOL ) suppository 650 mg (has no administration in time range)  ondansetron  (ZOFRAN ) tablet 4 mg (has no administration in time range)    Or  ondansetron  (ZOFRAN ) injection 4 mg (has no administration in time range)   vancomycin  (VANCOREADY) IVPB 2000 mg/400 mL (has no administration in time range)  ceFEPIme  (MAXIPIME ) 2 g in sodium chloride  0.9 % 100 mL IVPB (has no administration in time range)  vancomycin  variable dose per unstable renal function (pharmacist dosing) (has no administration in time range)  ceFEPIme  (MAXIPIME ) 2 g in sodium chloride  0.9 % 100 mL IVPB (0 g Intravenous Stopped 11/13/23 1253)  metroNIDAZOLE  (FLAGYL ) IVPB 500 mg (0 mg Intravenous Stopped 11/13/23 1402)  lactated ringers  bolus 1,000 mL (1,000 mLs Intravenous New Bag/Given 11/13/23 1504)  acetaminophen  (TYLENOL ) tablet 1,000 mg (1,000 mg Oral Given 11/13/23 1251)  ondansetron  (ZOFRAN ) injection 4 mg (4 mg  Intravenous Given 11/13/23 1306)  pantoprazole  (PROTONIX ) injection 40 mg (40 mg Intravenous Given 11/13/23 1454)     IMPRESSION / MDM / ASSESSMENT AND PLAN / ED COURSE  I reviewed the triage vital signs and the nursing notes.                              33 y.o. female with past medical history of hypertension, diabetes, hypothyroidism, adrenal insufficiency, diastolic CHF, CKD, anemia, and gastroparesis who presents to the ED with 2 days of generalized weakness, cough, vomiting, and diarrhea.  Patient's presentation is most consistent with acute presentation with potential threat to life or bodily function.  Differential diagnosis includes, but is not limited to, sepsis, pneumonia, ACS, PE, UTI, viral syndrome, COVID-19, influenza, dehydration, electrolyte abnormality, AKI, DKA.  Patient chronically ill-appearing but in no acute distress, vital signs remarkable for fever, tachycardia, and hypoxia.  She is not in any respiratory distress but oxygen saturation is noted to be 76% on room air, improved on 4 L nasal cannula.  Presentation concerning for sepsis and patient recently admitted to the hospital for Enterococcus avium septicemia.  We will start on broad-spectrum antibiotics, further assess with chest x-ray and urinalysis.   She has a benign abdominal exam, lab results are pending at this time.  Chest x-ray concerning for multifocal pneumonia, COVID and flu testing is negative.  Patient with significant anemia with hemoglobin of 4.8, will transfuse 2 units of PRBCs.  She reports some dark diarrhea recently, otherwise denies any bleeding, and we will give a dose of IV Protonix .  Case discussed with Dr. Therisa of GI, who recommends resuscitation and will evaluate the patient tomorrow.  Patient also with significant leukocytosis consistent with sepsis, AKI noted with hypomagnesemia, LFTs are unremarkable with no bilirubin elevation to suggest hemolysis.  Case discussed with hospitalist for admission.      FINAL CLINICAL IMPRESSION(S) / ED DIAGNOSES   Final diagnoses:  Sepsis without acute organ dysfunction, due to unspecified organism (HCC)  Anemia, unspecified type  Acute respiratory failure with hypoxia (HCC)     Rx / DC Orders   ED Discharge Orders     None        Note:  This document was prepared using Dragon voice recognition software and may include unintentional dictation errors.   Willo Dunnings, MD 11/13/23 [phone]

## 2023-11-13 NOTE — Sepsis Progress Note (Signed)
 Elink monitoring for the code sepsis protocol.

## 2023-11-13 NOTE — ED Notes (Addendum)
 MD at bedside attempting additional IV access.

## 2023-11-13 NOTE — ED Notes (Signed)
 Pt laying in bed. Bed in lowest position, call bell within reach. Blood transfusion infusing. NADN. No comfort measures requested at this time.

## 2023-11-13 NOTE — Consult Note (Signed)
 GI note [facility] gastroenterology  Consultation  Referring Provider:     Emergency room Primary Care Physician:  Ostwalt, [name], PA-C Primary Gastroenterologist:  Dr. Onita         Reason for Consultation:     Anemia  Date of Admission:  11/13/2023 Date of Consultation:  11/13/2023         HPI:   Ashley Camacho is a 33 y.o. female with a known history of uncontrolled diabetes mellitus seen by Dr. Onita back in July 2020 for, CKD, gastroparesis, protein calorie malnutrition, heart failure, cardiac arrest, abnormal weight loss, seen previously by [facility] not responded to Reglan  or erythromycin  in the past she had been on J-tube feedings not followed up since July 2024, known to have anemia of chronic disease has been referred for a POEM procedure of a stomach has presented to the emergency room with nausea vomiting diarrhea, chest x-ray is concerning for multifocal pneumonia.  I have been consulted for a low hemoglobin of 4.8 g, MCV 94.9.  11 days back hemoglobin was 9.1 g.  White cell count of 16.9.  Lactic acid is 1.3.  Creatinine of 2.25 albumin  of 2.8 BUN of 32 CO2 of 25, negative for COVID influenza A and B, INR 1.3 I visited her in the emergency room.  She denies any rectal bleeding hematemesis melena hematochezia, vaginal bleeding blood in the urine nasal bleeds.  Denies any NSAID use.  Denies seeing any blood outside of her body.  Her stool has been brown in color.  She said that she previously had diarrhea which has resolved Past Medical History:  Diagnosis Date   Acute metabolic encephalopathy 06/10/2022   Adrenal insufficiency (HCC)    Anemia    CHF (congestive heart failure) (HCC)    Gastroparesis    Hypertension    Hypotension    Intractable nausea and vomiting 11/13/2022   Migraine    Thyroid  disease    Type 1 diabetes [facility])     Past Surgical History:  Procedure Laterality Date   BIOPSY  01/14/2021   Procedure: BIOPSY;  Surgeon: Eda Iha, MD;   Location: [facility] ENDOSCOPY;  Service: Gastroenterology;;   CENTRAL LINE INSERTION N/A 10/10/2023   Procedure: CENTRAL LINE INSERTION;  Surgeon: Marea Selinda RAMAN, MD;  Location: [facility] INVASIVE CV LAB;  Service: Cardiovascular;  Laterality: N/A;   CESAREAN SECTION     x2   CESAREAN SECTION WITH BILATERAL TUBAL LIGATION N/A 02/17/2021   Procedure: CESAREAN SECTION WITH BILATERAL TUBAL LIGATION;  Surgeon: Barbra Lang PARAS, DO;  Location: [facility] LD ORS;  Service: Obstetrics;  Laterality: N/A;   ESOPHAGOGASTRODUODENOSCOPY (EGD) WITH PROPOFOL  N/A 01/14/2021   Procedure: ESOPHAGOGASTRODUODENOSCOPY (EGD) WITH PROPOFOL ;  Surgeon: Eda Iha, MD;  Location: [facility] ENDOSCOPY;  Service: Gastroenterology;  Laterality: N/A;   IR BONE MARROW BIOPSY & ASPIRATION  12/16/2022   IR REPLC DUODEN/JEJUNO TUBE PERCUT W/FLUORO  11/22/2022   JEJUNOSTOMY N/A 06/13/2022   Procedure: DEXTER;  Surgeon: Jordis Laneta FALCON, MD;  Location: [facility] ORS;  Service: General;  Laterality: N/A;   MOUTH SURGERY      Prior to Admission medications   Medication Sig Start Date End Date Taking? Authorizing Provider  acetaminophen  (TYLENOL ) 500 MG tablet Take 2 tablets (1,000 mg total) by mouth every 6 (six) hours as needed for headache, fever or moderate pain. 08/05/22  Yes Djan, Drue DASEN, MD  BAQSIMI ONE PACK 3 MG/DOSE POWD Place 3 mg into the nose as needed. 06/09/23  Yes [provider]  divalproex  (DEPAKOTE ) 250 MG DR tablet Take 250 mg by mouth daily. 09/07/23 12/06/23 Yes [provider]  insulin  aspart (NOVOLOG ) 100 UNIT/ML FlexPen Inject 2 Units into the skin 3 (three) times daily with meals. If eating and Blood Glucose (BG) 80 or higher inject 2 units for meal coverage and add correction dose per scale. If not eating, correction dose only. BG <150= 0 unit; BG 150-200= 1 unit; BG 201-250= 3 unit; BG 251-300= 5 unit; BG 301-350= 7 unit; BG 351-400= 9 unit; BG >400= 11 unit and Call Primary Care. 10/12/23  Yes [name], MD   insulin  glargine (LANTUS ) 100 UNIT/ML Solostar Pen Inject 10 Units into the skin daily. 10/12/23  Yes Wieting, Richard, MD  levothyroxine  (SYNTHROID ) 75 MCG tablet TAKE 1 TABLET (75 MCG TOTAL) BY MOUTH DAILY AT 6 (SIX) AM. 06/20/23  Yes Ostwalt, [name], PA-C  Vitamin D , Ergocalciferol , (DRISDOL ) 1.25 MG (50000 UNIT) CAPS capsule Take 50,000 Units by mouth once a week. (Sunday) 08/08/23  Yes [provider]  BD PEN NEEDLE MINI ULTRAFINE 31G X 5 MM MISC by Other route. 07/18/23 07/17/24  [provider]  Blood Pressure Monitoring (BLOOD PRESSURE CUFF) MISC Use as directed 05/26/23   Loistine Sober, NP  cholecalciferol  (VITAMIN D3) 25 MCG (1000 UNIT) tablet VITAMIN D3 25 MCG (1000 UT) TABS Patient not taking: Reported on 11/13/2023 08/30/17   [provider]  Continuous Glucose Sensor (DEXCOM G7 SENSOR) MISC SMARTSIG:1 Each Every 10 Days    [provider]  hydrocortisone  (CORTEF ) 10 MG tablet Take 2 tablets (20 mg total) by mouth daily with breakfast AND 1 tablet (10 mg total) daily with supper. Patient not taking: Reported on 11/13/2023 05/27/23   [name], MD  Insulin  Pen Needle (PEN NEEDLES) 31G X 6 MM MISC PEN NEEDLES 31G X 6 MM 05/31/17   [provider]  Pancrelipase , Lip-Prot-Amyl, 24000-76000 units CPEP Take 1 capsule (24,000 Units total) by mouth 3 (three) times daily before meals. Patient not taking: Reported on 11/13/2023 11/04/23   [name], MD  saccharomyces boulardii (FLORASTOR) 250 MG capsule Take 250 mg by mouth 2 (two) times daily. Patient not taking: Reported on 11/13/2023    [provider]    Family History  Problem Relation Age of Onset   Breast cancer Mother    Seizures Mother    Diabetes type I Father    CAD Father    Lung cancer Maternal Grandfather    CAD Paternal Grandmother    CAD Paternal Grandfather    Ovarian cancer Neg Hx      Social History   Tobacco Use   Smoking status: Never    Passive exposure:  Never   Smokeless tobacco: Never  Vaping Use   Vaping status: Never Used  Substance Use Topics   Alcohol use: No   Drug use: No    Allergies as of 11/13/2023   (No Known Allergies)    Review of Systems:    All systems reviewed and negative except where noted in HPI.   Physical Exam:  Vital signs in last 24 hours: Temp:  [99.2 F (37.3 C)-103.2 F (39.6 C)] 99.2 F (37.3 C) (06/22 1510) Pulse Rate:  [116-138] 116 (06/22 1400) Resp:  [16-33] 33 (06/22 1400) BP: (103-150)/(58-78) 103/58 (06/22 1400) SpO2:  [76 %-99 %] 99 % (06/22 1400) Weight:  [49.9 kg] 49.9 kg (06/22 1511)   General:   Pleasant, cooperative in NAD Head:  Normocephalic and atraumatic.  Eyes:   No icterus.   Conjunctiva pink. PERRLA. Ears:  Normal auditory acuity. Neck:  Supple; no masses or thyroidomegaly Lungs: Decreased air entry bilaterally with no added sounds Heart:  Regular rate and rhythm;  Without murmur, clicks, rubs or gallops Abdomen:  Soft, nondistended, nontender. Normal bowel sounds. No appreciable masses or hepatomegaly.  No rebound or guarding.  Neurologic:  Alert and oriented x3;  grossly normal neurologically. Psych:  Alert and cooperative. Normal affect.  LAB RESULTS: Recent Labs    11/13/23 1145  WBC 16.9*  HGB 4.8*  HCT 14.8*  PLT 158   BMET Recent Labs    11/13/23 1145  NA 146*  K 3.4*  CL 112*  CO2 25  GLUCOSE 108*  BUN 32*  CREATININE 2.25*  CALCIUM  7.6*   LFT Recent Labs    11/13/23 1145  PROT 5.0*  ALBUMIN  2.8*  AST 16  ALT 8  ALKPHOS 28*  BILITOT 0.5   PT/INR Recent Labs    11/13/23 1145  LABPROT 15.9*  INR 1.3*    STUDIES: DG Chest Port 1 View Result Date: 11/13/2023 CLINICAL DATA:  Sepsis.  Generalized weakness with cough and cold. EXAM: PORTABLE CHEST 1 VIEW COMPARISON:  10/31/2023 FINDINGS: Heart size and mediastinal contours are normal. No pleural effusion. Multifocal airspace disease is noted within the upper and lower right lung and left  lower lung. Visualized osseous structures are unremarkable. IMPRESSION: Multifocal airspace disease compatible with pneumonia. Electronically Signed   By: Waddell Calk M.D.   On: 11/13/2023 12:11      Impression / Plan:   Ashley Camacho is a 33 y.o. y/o female with controlled diabetes diabetic gastropathy not compliant with follow-up CKD heart failure anemia of chronic disease presents to the emergency room with nausea vomiting diarrhea multifocal pneumonia and I have been consulted for a hemoglobin of 4 g with no clear acute blood loss noted outside her body.  Plan 1.  Monitor CBC transfuse as needed 2.  Continue treating underlying sepsis 3.  Watch for any signs of hematemesis, melena hematochezia.  At this point of time with multifocal pneumonia would not pursue upper endoscopy evaluation unless life-threatening since anesthesia would be very high risk due to the pneumonia.  If there is any source of bleeding would consider angiography if her kidneys would permit.  Bilirubin not elevated but consider other sources of anemia such as hemolysis if appropriate.  Thank you for involving me in the care of this patient.      LOS: 0 days   Ruel Kung, MD  11/13/2023, 4:16 PM

## 2023-11-13 NOTE — ED Triage Notes (Signed)
 Pt arrives from home via [facility] for generalized weakness. Pt was seen here last week for UTI with fever and hypotension. Pt alert, ill appearing on arrival. Pt able to answer questions appropriately. Pt c/o new cough and cold chills.

## 2023-11-13 NOTE — H&P (Signed)
 History and Physical    Patient: Ashley Camacho FMW:[id number] DOB: April 29, 1991 DOA: 11/13/2023 DOS: the patient was seen and examined on 11/13/2023 PCP: Ostwalt, [name], PA-C  Patient coming from: Home  Chief Complaint:  Chief Complaint  Patient presents with   Weakness   HPI: Ashley Camacho is a 33 y.o. female with medical history significant of acute metabolic encephalopathy, renal insufficiency, anemia, CHF, gastroparesis, hypertension, hypotension, history intractable nausea and vomiting, migraine headaches, hypothyroidism, type 1 diabetes who was recently admitted on 10/31/2023 until 11/04/2023 due to sepsis norovirus and Enterococcus albumin  septicemia who presented today with generalized weakness, fever, fatigue and malaise. He denied rhinorrhea, sore throat, wheezing or hemoptysis. No chest pain, palpitations, diaphoresis, PND, orthopnea or pitting edema of the lower extremities. No abdominal pain, nausea, emesis, diarrhea, constipation, melena or hematochezia. No flank pain, dysuria, frequency or hematuria. No polyuria, polydipsia or polyphagia.  Lab work: Coronavirus, influenza and RSV PCR was negative.  CBC showed white count 16.9, hemoglobin 4.8 g/dL (11 days ago hemoglobin was 9.1 g/dL) and platelets 841.  PT 15.9 and INR 1.3.  Lactic acid was normal.  Phosphorus 2.4 magnesium  1.4 mg/dL.  CMP showed a sodium 146, potassium 3.4, chloride 112 and CO2 25 mmol/L.  Glucose 108, BUN 32 and creatinine 2.25 mg/dL.  Magnesium  normalizes after correction.  Total protein is 5.0 and albumin  2.8 g/dL.  Transaminases and bilirubin were normal.  Alkaline phosphatase was only 28 units/L.  Imaging: Portable 1 view chest radiograph showing multifocal airspace disease compatible with pneumonia.   ED course: Initial vital signs were temperature 39.6 F, pulse 138, respiration 16, BP 150/78 mmHg O2 sat 76% on room air.  O2 sat increased to 98% on nasal cannula oxygen.  She received 2 units of PRBC,  acetaminophen  1000 mg p.o. x 1, cefepime  2 g IVPB, metronidazole  500 mg IVP, ondansetron  4 mg IVP, LR 1000 mL bolus and vancomycin .  Review of Systems: As mentioned in the history of present illness. All other systems reviewed and are negative. Past Medical History:  Diagnosis Date   Acute metabolic encephalopathy 06/10/2022   Adrenal insufficiency (HCC)    Anemia    CHF (congestive heart failure) (HCC)    Gastroparesis    Hypertension    Hypotension    Intractable nausea and vomiting 11/13/2022   Migraine    Thyroid  disease    Type 1 diabetes [facility])    Past Surgical History:  Procedure Laterality Date   BIOPSY  01/14/2021   Procedure: BIOPSY;  Surgeon: Eda Iha, MD;  Location: [facility] ENDOSCOPY;  Service: Gastroenterology;;   CENTRAL LINE INSERTION N/A 10/10/2023   Procedure: CENTRAL LINE INSERTION;  Surgeon: Marea Selinda RAMAN, MD;  Location: [facility] INVASIVE CV LAB;  Service: Cardiovascular;  Laterality: N/A;   CESAREAN SECTION     x2   CESAREAN SECTION WITH BILATERAL TUBAL LIGATION N/A 02/17/2021   Procedure: CESAREAN SECTION WITH BILATERAL TUBAL LIGATION;  Surgeon: Barbra Lang PARAS, DO;  Location: [facility] LD ORS;  Service: Obstetrics;  Laterality: N/A;   ESOPHAGOGASTRODUODENOSCOPY (EGD) WITH PROPOFOL  N/A 01/14/2021   Procedure: ESOPHAGOGASTRODUODENOSCOPY (EGD) WITH PROPOFOL ;  Surgeon: Eda Iha, MD;  Location: [facility] ENDOSCOPY;  Service: Gastroenterology;  Laterality: N/A;   IR BONE MARROW BIOPSY & ASPIRATION  12/16/2022   IR REPLC DUODEN/JEJUNO TUBE PERCUT W/FLUORO  11/22/2022   JEJUNOSTOMY N/A 06/13/2022   Procedure: DEXTER;  Surgeon: Jordis Laneta FALCON, MD;  Location: [facility] ORS;  Service: General;  Laterality: N/A;   MOUTH SURGERY  Social History:  reports that she has never smoked. She has never been exposed to tobacco smoke. She has never used smokeless tobacco. She reports that she does not drink alcohol and does not use drugs.  No Known Allergies  Family History   Problem Relation Age of Onset   Breast cancer Mother    Seizures Mother    Diabetes type I Father    CAD Father    Lung cancer Maternal Grandfather    CAD Paternal Grandmother    CAD Paternal Grandfather    Ovarian cancer Neg Hx     Prior to Admission medications   Medication Sig Start Date End Date Taking? Authorizing Provider  acetaminophen  (TYLENOL ) 500 MG tablet Take 2 tablets (1,000 mg total) by mouth every 6 (six) hours as needed for headache, fever or moderate pain. 08/05/22   Dorinda Drue DASEN, MD  BAQSIMI ONE PACK 3 MG/DOSE POWD Place 3 mg into the nose as needed. 06/09/23   [provider]  BD PEN NEEDLE MINI ULTRAFINE 31G X 5 MM MISC by Other route. 07/18/23 07/17/24  [provider]  Blood Pressure Monitoring (BLOOD PRESSURE CUFF) MISC Use as directed 05/26/23   Loistine Sober, NP  cholecalciferol  (VITAMIN D3) 25 MCG (1000 UNIT) tablet VITAMIN D3 25 MCG (1000 UT) TABS 08/30/17   [provider]  Continuous Glucose Sensor (DEXCOM G7 SENSOR) MISC SMARTSIG:1 Each Every 10 Days    [provider]  divalproex  (DEPAKOTE ) 250 MG DR tablet Take 250 mg by mouth daily. 09/07/23 12/06/23  [provider]  hydrocortisone  (CORTEF ) 10 MG tablet Take 2 tablets (20 mg total) by mouth daily with breakfast AND 1 tablet (10 mg total) daily with supper. 05/27/23   [name], MD  insulin  aspart (NOVOLOG ) 100 UNIT/ML FlexPen Inject 2 Units into the skin 3 (three) times daily with meals. If eating and Blood Glucose (BG) 80 or higher inject 2 units for meal coverage and add correction dose per scale. If not eating, correction dose only. BG <150= 0 unit; BG 150-200= 1 unit; BG 201-250= 3 unit; BG 251-300= 5 unit; BG 301-350= 7 unit; BG 351-400= 9 unit; BG >400= 11 unit and Call Primary Care. 10/12/23   [name], MD  insulin  glargine (LANTUS ) 100 UNIT/ML Solostar Pen Inject 10 Units into the skin daily. 10/12/23   [name], MD  Insulin  Pen Needle  (PEN NEEDLES) 31G X 6 MM MISC PEN NEEDLES 31G X 6 MM 05/31/17   [provider]  levothyroxine  (SYNTHROID ) 75 MCG tablet TAKE 1 TABLET (75 MCG TOTAL) BY MOUTH DAILY AT 6 (SIX) AM. 06/20/23   Ostwalt, [name], PA-C  Pancrelipase , Lip-Prot-Amyl, 24000-76000 units CPEP Take 1 capsule (24,000 Units total) by mouth 3 (three) times daily before meals. 11/04/23   [name], MD  saccharomyces boulardii (FLORASTOR) 250 MG capsule Take 250 mg by mouth 2 (two) times daily.    [provider]  Vitamin D , Ergocalciferol , (DRISDOL ) 1.25 MG (50000 UNIT) CAPS capsule Take 50,000 Units by mouth once a week. (Sunday) 08/08/23   [provider]    Physical Exam: Vitals:   11/13/23 1140 11/13/23 1142 11/13/23 1230 11/13/23 1400  BP: (!) 150/78   (!) 103/58  Pulse: (!) 138  (!) 131 (!) 116  Resp: 16  (!) 33 (!) 33  Temp:  (!) 103.2 F (39.6 C)    TempSrc:  Oral    SpO2: (!) 76%  98% 99%   Physical Exam Vitals and nursing  note reviewed.  Constitutional:      General: She is awake. She is not in acute distress.    Appearance: She is ill-appearing.     Interventions: Nasal cannula in place.  HENT:     Head: Normocephalic.     Nose: No rhinorrhea.     Mouth/Throat:     Mouth: Mucous membranes are dry.   Eyes:     General: No scleral icterus.    Pupils: Pupils are equal, round, and reactive to light.   Neck:     Vascular: No JVD.   Cardiovascular:     Rate and Rhythm: Normal rate and regular rhythm.     Heart sounds: S1 normal and S2 normal.  Pulmonary:     Breath sounds: Rhonchi and rales present. No wheezing.  Abdominal:     General: There is no distension.     Palpations: Abdomen is soft.     Tenderness: There is no abdominal tenderness. There is no right CVA tenderness or left CVA tenderness.   Musculoskeletal:     Cervical back: Neck supple.     Right lower leg: No edema.     Left lower leg: No edema.   Skin:    General: Skin is warm and dry.     Coloration:  Skin is pale.   Neurological:     General: No focal deficit present.     Mental Status: She is alert and oriented to person, place, and time.   Psychiatric:        Mood and Affect: Mood normal.        Behavior: Behavior normal. Behavior is cooperative.    Data Reviewed:  Results are pending, will review when available. 11/03/2023 echocardiogram report. IMPRESSIONS:   1. Left ventricular ejection fraction, by estimation, is 55 to 60%. The  left ventricle has normal function. The left ventricle has no regional  wall motion abnormalities. Left ventricular diastolic parameters were  normal. The average left ventricular  global longitudinal strain is -23.4 %. The global longitudinal strain is  normal.   2. Right ventricular systolic function is normal. The right ventricular  size is normal. There is normal pulmonary artery systolic pressure.   3. The mitral valve is normal in structure. Mild to moderate mitral valve  regurgitation. No evidence of mitral stenosis.   4. The aortic valve is normal in structure. Aortic valve regurgitation is  not visualized. No aortic stenosis is present.   5. The inferior vena cava is normal in size with greater than 50%  respiratory variability, suggesting right atrial pressure of 3 mmHg.   Conclusion(s)/Recommendation(s): No evidence of valvular vegetations on  this transthoracic echocardiogram.   EKG: Vent. rate 135 BPM PR interval 143 ms QRS duration 57 ms QT/QTcB 292/438 ms P-R-T axes 64 27 57 Sinus tachycardia Anterior infarct, old Nonspecific T abnormalities, lateral leads  Assessment and Plan: Principal Problem:   Sepsis due to pneumonia (HCC) .Admit to SDU/inpatient. Continue supplemental oxygen.  Bronchodilators as needed. Continue cefepime  2 g every 8 hours.   Continue vancomycin  per pharmacy. Follow-up blood culture and sensitivity Follow CBC and CMP in a.m.  Active Problems:   ABLA (acute blood loss anemia) Transfused 2  units of PRBC. Clear liquid diet. N.p.o. after midnight. GI will see in the morning.    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 Transfusing PRBC. Received IV fluids earlier. Avoid hypotension. Avoid nephrotoxins.  Monitor intake and output. Monitor renal function electrolytes.    Hypomagnesemia Replaced.    Hypophosphatemia Replacing.    Hyponatremia Continue IV fluids.    Hypokalemia Replacing. Follow-up potassium level in AM.    Hypothyroidism Continue levothyroxine  75 mcg p.o. daily.    Essential hypertension Not on antihypertensives. Blood pressures are soft. Monitor blood pressure closely.    Moderate protein malnutrition (HCC) In the setting of anemia and acute illness May benefit from protein supplementation. Consider nutritional services evaluation. Follow-up albumin  level.    Type 1 diabetes mellitus (HCC) Continue Lantus  10 units daily or formulary equivalent.    Advance Care Planning:   Code Status: Full Code   Consults:   Family Communication:   Severity of Illness: The appropriate patient status for this patient is INPATIENT. Inpatient status is judged to be reasonable and necessary in order to provide the required intensity of service to ensure the patient's safety. The patient's presenting symptoms, physical exam findings, and initial radiographic and laboratory data in the context of their chronic comorbidities is felt to place them at high risk for further clinical deterioration. Furthermore, it is not anticipated that the patient will be medically stable for discharge from the hospital within 2 midnights of admission.   * I certify that at the point of admission it is my clinical judgment that the patient will require inpatient hospital care spanning beyond 2 midnights from the point of admission due to high intensity of service, high risk for further  deterioration and high frequency of surveillance required.*  Author: Alm Dorn Castor, MD 11/13/2023 2:22 PM  For on call review [email or web address].   This document was prepared using Dragon voice recognition software and may contain some unintended transcription errors.

## 2023-11-13 NOTE — Progress Notes (Signed)
 Attempt to start PIV in LUA was unsuccessful but was able to obtain blood for lab work which was given to primary RN.  Basilic vein in RUA was found with US  but preserved for future PICCs if necessary.  No other suitable veins were found.  Primary RN is aware.

## 2023-11-13 NOTE — Consult Note (Signed)
 CODE SEPSIS - PHARMACY COMMUNICATION  **Broad Spectrum Antibiotics should be administered within 1 hour of Sepsis diagnosis**  Time Code Sepsis Called/Page Received: 1144  Antibiotics Ordered: Cefepime , Vancomycin , Metronidazole   Time of 1st antibiotic administration: 1203  Additional action taken by pharmacy: N/A  If necessary, Name of Provider/Nurse Contacted: N/A  Evonnie Nieves, PharmD Clinical Pharmacist  11/13/2023  11:46 AM

## 2023-11-14 DIAGNOSIS — D649 Anemia, unspecified: Secondary | ICD-10-CM

## 2023-11-14 DIAGNOSIS — J189 Pneumonia, unspecified organism: Secondary | ICD-10-CM | POA: Diagnosis not present

## 2023-11-14 DIAGNOSIS — A419 Sepsis, unspecified organism: Secondary | ICD-10-CM | POA: Diagnosis not present

## 2023-11-14 LAB — RESPIRATORY PANEL BY PCR

## 2023-11-14 LAB — CBC WITH DIFFERENTIAL/PLATELET
Abs Immature Granulocytes: 0.1 10*3/uL — ABNORMAL HIGH (ref 0.00–0.07)
Basophils Absolute: 0 10*3/uL (ref 0.0–0.1)
Basophils Relative: 0 %
Eosinophils Absolute: 0.2 10*3/uL (ref 0.0–0.5)
Eosinophils Relative: 1 %
HCT: 30.8 % — ABNORMAL LOW (ref 36.0–46.0)
Hemoglobin: 10.5 g/dL — ABNORMAL LOW (ref 12.0–15.0)
Immature Granulocytes: 1 %
Lymphocytes Relative: 9 %
Lymphs Abs: 1.7 10*3/uL (ref 0.7–4.0)
MCH: 29.6 pg (ref 26.0–34.0)
MCHC: 34.1 g/dL (ref 30.0–36.0)
MCV: 86.8 fL (ref 80.0–100.0)
Monocytes Absolute: 1 10*3/uL (ref 0.1–1.0)
Monocytes Relative: 5 %
Neutro Abs: 16.8 10*3/uL — ABNORMAL HIGH (ref 1.7–7.7)
Neutrophils Relative %: 84 %
Platelets: 110 10*3/uL — ABNORMAL LOW (ref 150–400)
RBC: 3.55 MIL/uL — ABNORMAL LOW (ref 3.87–5.11)
RDW: 16.4 % — ABNORMAL HIGH (ref 11.5–15.5)
WBC: 19.8 10*3/uL — ABNORMAL HIGH (ref 4.0–10.5)
nRBC: 0 % (ref 0.0–0.2)

## 2023-11-14 LAB — COMPREHENSIVE METABOLIC PANEL WITH GFR
ALT: 60 U/L — ABNORMAL HIGH (ref 0–44)
AST: 66 U/L — ABNORMAL HIGH (ref 15–41)
Albumin: 2.5 g/dL — ABNORMAL LOW (ref 3.5–5.0)
Alkaline Phosphatase: 51 U/L (ref 38–126)
Anion gap: 6 (ref 5–15)
BUN: 32 mg/dL — ABNORMAL HIGH (ref 6–20)
CO2: 27 mmol/L (ref 22–32)
Calcium: 7.5 mg/dL — ABNORMAL LOW (ref 8.9–10.3)
Chloride: 109 mmol/L (ref 98–111)
Creatinine, Ser: 2.29 mg/dL — ABNORMAL HIGH (ref 0.44–1.00)
GFR, Estimated: 28 mL/min — ABNORMAL LOW (ref >60)
Glucose, Bld: 125 mg/dL — ABNORMAL HIGH (ref 70–99)
Potassium: 3.8 mmol/L (ref 3.5–5.1)
Sodium: 142 mmol/L (ref 135–145)
Total Bilirubin: 1.1 mg/dL (ref 0.0–1.2)
Total Protein: 5 g/dL — ABNORMAL LOW (ref 6.5–8.1)

## 2023-11-14 LAB — TYPE AND SCREEN
ABO/RH(D): A POS
Antibody Screen: NEGATIVE
Unit division: 0
Unit division: 0

## 2023-11-14 LAB — GLUCOSE, CAPILLARY
Glucose-Capillary: 100 mg/dL — ABNORMAL HIGH (ref 70–99)
Glucose-Capillary: 114 mg/dL — ABNORMAL HIGH (ref 70–99)
Glucose-Capillary: 139 mg/dL — ABNORMAL HIGH (ref 70–99)
Glucose-Capillary: 144 mg/dL — ABNORMAL HIGH (ref 70–99)
Glucose-Capillary: 168 mg/dL — ABNORMAL HIGH (ref 70–99)

## 2023-11-14 LAB — URINE CULTURE: Culture: <10000 — AB

## 2023-11-14 LAB — BPAM RBC
Blood Product Expiration Date: 202507212359
Blood Product Expiration Date: 202507212359
ISSUE DATE / TIME: 202506221645
ISSUE DATE / TIME: 202506221949
Unit Type and Rh: 6200
Unit Type and Rh: 6200

## 2023-11-14 LAB — STREP PNEUMONIAE URINARY ANTIGEN: Strep Pneumo Urinary Antigen: NEGATIVE

## 2023-11-14 LAB — HEMOGLOBIN AND HEMATOCRIT, BLOOD
HCT: 34.1 % — ABNORMAL LOW (ref 36.0–46.0)
Hemoglobin: 11.8 g/dL — ABNORMAL LOW (ref 12.0–15.0)

## 2023-11-14 LAB — PROCALCITONIN: Procalcitonin: 49.39 ng/mL

## 2023-11-14 MED ORDER — INSULIN ASPART 100 UNIT/ML IJ SOLN
0.0000 [IU] | Freq: Three times a day (TID) | INTRAMUSCULAR | Status: DC
  Administered 2023-11-14: 2 [IU] via SUBCUTANEOUS
  Administered 2023-11-15: 5 [IU] via SUBCUTANEOUS
  Administered 2023-11-15: 3 [IU] via SUBCUTANEOUS
  Administered 2023-11-16: 1 [IU] via SUBCUTANEOUS
  Administered 2023-11-16: 3 [IU] via SUBCUTANEOUS
  Filled 2023-11-14 (×5): qty 1

## 2023-11-14 MED ORDER — PANCRELIPASE (LIP-PROT-AMYL) 12000-38000 UNITS PO CPEP
24000.0000 [IU] | ORAL_CAPSULE | Freq: Three times a day (TID) | ORAL | Status: DC
  Administered 2023-11-14 – 2023-11-16 (×7): 24000 [IU] via ORAL
  Filled 2023-11-14 (×7): qty 2

## 2023-11-14 MED ORDER — HYDROCORTISONE 10 MG PO TABS
10.0000 mg | ORAL_TABLET | Freq: Every day | ORAL | Status: DC
  Administered 2023-11-14 – 2023-11-15 (×2): 10 mg via ORAL
  Filled 2023-11-14 (×3): qty 1

## 2023-11-14 MED ORDER — HYDROCORTISONE 10 MG PO TABS
20.0000 mg | ORAL_TABLET | Freq: Every day | ORAL | Status: DC
  Administered 2023-11-14 – 2023-11-16 (×3): 20 mg via ORAL
  Filled 2023-11-14 (×3): qty 2

## 2023-11-14 MED ORDER — SODIUM CHLORIDE 0.9 % IV SOLN: INTRAVENOUS | Status: AC

## 2023-11-14 MED ORDER — ENOXAPARIN SODIUM 30 MG/0.3ML IJ SOSY
30.0000 mg | PREFILLED_SYRINGE | INTRAMUSCULAR | Status: DC
  Administered 2023-11-14: 30 mg via SUBCUTANEOUS
  Filled 2023-11-14 (×2): qty 0.3

## 2023-11-14 MED ORDER — ACETAMINOPHEN 500 MG PO TABS
1000.0000 mg | ORAL_TABLET | Freq: Three times a day (TID) | ORAL | Status: DC | PRN
  Administered 2023-11-14: 1000 mg via ORAL
  Filled 2023-11-14: qty 2

## 2023-11-14 MED ORDER — INSULIN ASPART 100 UNIT/ML IJ SOLN
0.0000 [IU] | Freq: Every day | INTRAMUSCULAR | Status: DC
  Administered 2023-11-15: 3 [IU] via SUBCUTANEOUS
  Filled 2023-11-14: qty 1

## 2023-11-14 MED ORDER — METOPROLOL TARTRATE 25 MG PO TABS
25.0000 mg | ORAL_TABLET | Freq: Two times a day (BID) | ORAL | Status: DC
  Administered 2023-11-14 – 2023-11-16 (×4): 25 mg via ORAL
  Filled 2023-11-14 (×4): qty 1

## 2023-11-14 NOTE — Plan of Care (Signed)

## 2023-11-14 NOTE — Progress Notes (Signed)
 Rogelia Copping, MD [facility]   [address] [place], [place] [address] Phone: [phone] Fax : [phone]   Subjective: GI was consulted for this patient due to hemoglobin of 4.8.  The patient baseline hemoglobin is between 8.4 and 9.1 and was given 2 units of packed red blood cells.  The patient's hemoglobin came up to 11.8 with a recheck 5 hours later that showed it to be 10.5.  The patient denies any sign of any GI bleeding.  She also denies any abdominal pain at the present time.   Objective: Vital signs in last 24 hours: Vitals:   11/14/23 1000 11/14/23 1100 11/14/23 1120 11/14/23 1200  BP: (!) 145/73 (!) 162/94  (!) 168/89  Pulse: 99 (!) 106  (!) 114  Resp: (!) 28 (!) 25  20  Temp:   99.6 F (37.6 C)   TempSrc:   Oral   SpO2: 92% 100%  100%  Weight:      Height:       Weight change:   Intake/Output Summary (Last 24 hours) at 11/14/2023 1324 Last data filed at 11/14/2023 0600 Gross per 24 hour  Intake 1646.33 ml  Output 250 ml  Net 1396.33 ml     Exam: General: Patient lying in bed in no apparent distress.  Alert and oriented x 3   Lab Results: @LABTEST2 @ Micro Results: Recent Results (from the past 240 hours)  Resp panel by RT-PCR (RSV, Flu A&B, Covid) Anterior Nasal Swab     Status: None   Collection Time: 11/13/23 11:45 AM   Specimen: Anterior Nasal Swab  Result Value Ref Range Status   SARS Coronavirus 2 by RT PCR NEGATIVE NEGATIVE Final    Comment: (NOTE) SARS-CoV-2 target nucleic acids are NOT DETECTED.  The SARS-CoV-2 RNA is generally detectable in upper respiratory specimens during the acute phase of infection. The lowest concentration of SARS-CoV-2 viral copies this assay can detect is 138 copies/mL. A negative result does not preclude SARS-Cov-2 infection and should not be used as the sole basis for treatment or other patient management decisions. A negative result may occur with  improper specimen collection/handling, submission of  specimen other than nasopharyngeal swab, presence of viral mutation(s) within the areas targeted by this assay, and inadequate number of viral copies(<138 copies/mL). A negative result must be combined with clinical observations, patient history, and epidemiological information. The expected result is Negative.  Fact Sheet for Patients:  BloggerCourse.com  Fact Sheet for Healthcare Providers:  SeriousBroker.it  This test is no t yet approved or cleared by the [place]  FDA and  has been authorized for detection and/or diagnosis of SARS-CoV-2 by FDA under an Emergency Use Authorization (EUA). This EUA will remain  in effect (meaning this test can be used) for the duration of the COVID-19 declaration under Section 564(b)(1) of the Act, 21 U.S.C.section 360bbb-3(b)(1), unless the authorization is terminated  or revoked sooner.       Influenza A by PCR NEGATIVE NEGATIVE Final   Influenza B by PCR NEGATIVE NEGATIVE Final    Comment: (NOTE) The Xpert Xpress SARS-CoV-2/FLU/RSV plus assay is intended as an aid in the diagnosis of influenza from Nasopharyngeal swab specimens and should not be used as a sole basis for treatment. Nasal washings and aspirates are unacceptable for Xpert Xpress SARS-CoV-2/FLU/RSV testing.  Fact Sheet for Patients: BloggerCourse.com  Fact Sheet for Healthcare Providers: SeriousBroker.it  This test is not yet approved or cleared by the [place]  FDA and has been  authorized for detection and/or diagnosis of SARS-CoV-2 by FDA under an Emergency Use Authorization (EUA). This EUA will remain in effect (meaning this test can be used) for the duration of the COVID-19 declaration under Section 564(b)(1) of the Act, 21 U.S.C. section 360bbb-3(b)(1), unless the authorization is terminated or revoked.     Resp Syncytial Virus by PCR NEGATIVE NEGATIVE Final     Comment: (NOTE) Fact Sheet for Patients: BloggerCourse.com  Fact Sheet for Healthcare Providers: SeriousBroker.it  This test is not yet approved or cleared by the [place]  FDA and has been authorized for detection and/or diagnosis of SARS-CoV-2 by FDA under an Emergency Use Authorization (EUA). This EUA will remain in effect (meaning this test can be used) for the duration of the COVID-19 declaration under Section 564(b)(1) of the Act, 21 U.S.C. section 360bbb-3(b)(1), unless the authorization is terminated or revoked.  Performed at [facility], [address], [place], [place] [address]   Blood Culture (routine x 2)     Status: None (Preliminary result)   Collection Time: 11/13/23 11:46 AM   Specimen: BLOOD  Result Value Ref Range Status   Specimen Description BLOOD BLOOD LEFT WRIST  Final   Special Requests   Final    BOTTLES DRAWN AEROBIC ONLY Blood Culture results may not be optimal due to an inadequate volume of blood received in culture bottles   Culture   Final    NO GROWTH < 24 HOURS Performed at [facility], [address], [place], [place] [address]    Report Status PENDING  Incomplete  Blood Culture (routine x 2)     Status: None (Preliminary result)   Collection Time: 11/13/23 11:46 AM   Specimen: BLOOD  Result Value Ref Range Status   Specimen Description BLOOD BLOOD RIGHT WRIST  Final   Special Requests   Final    BOTTLES DRAWN AEROBIC AND ANAEROBIC Blood Culture results may not be optimal due to an inadequate volume of blood received in culture bottles   Culture   Final    NO GROWTH < 24 HOURS Performed at [facility], [address] [place], [place] [address]    Report Status PENDING  Incomplete  MRSA Next Gen by PCR, Nasal     Status: None   Collection Time: 11/13/23  8:44 PM   Specimen: Nasal Mucosa; Nasal Swab  Result Value Ref Range Status   MRSA by PCR Next  Gen NOT DETECTED NOT DETECTED Final    Comment: (NOTE) The GeneXpert MRSA Assay (FDA approved for NASAL specimens only), is one component of a comprehensive MRSA colonization surveillance program. It is not intended to diagnose MRSA infection nor to guide or monitor treatment for MRSA infections. Test performance is not FDA approved in patients less than 78 years old. Performed at [facility], [address] [place], [place] [address]    Studies/Results: [facility] Chest Port 1 View Result Date: 11/13/2023 CLINICAL DATA:  Sepsis.  Generalized weakness with cough and cold. EXAM: PORTABLE CHEST 1 VIEW COMPARISON:  10/31/2023 FINDINGS: Heart size and mediastinal contours are normal. No pleural effusion. Multifocal airspace disease is noted within the upper and lower right lung and left lower lung. Visualized osseous structures are unremarkable. IMPRESSION: Multifocal airspace disease compatible with pneumonia. Electronically Signed   By: Waddell Calk M.D.   On: 11/13/2023 12:11   Medications: I have reviewed the patient's current medications. Scheduled Meds:  Chlorhexidine  Gluconate Cloth  6 each Topical Daily   divalproex   250 mg Oral Daily   hydrocortisone   20 mg Oral Q breakfast   And   hydrocortisone   10 mg Oral Q supper   insulin  glargine-yfgn  10 Units Subcutaneous Daily   levothyroxine   75 mcg Oral Q0600   lipase/protease/amylase  24,000 Units Oral TID AC   pantoprazole  (PROTONIX ) IV  40 mg Intravenous Q12H   phosphorus  250 mg Oral QID   Continuous Infusions:  ceFEPime  (MAXIPIME ) IV 2 g (11/14/23 1127)   PRN Meds:.acetaminophen  **OR** acetaminophen , dextrose , ondansetron  **OR** ondansetron  (ZOFRAN ) IV   Assessment: Principal Problem:   Sepsis due to pneumonia [facility]) Active Problems:   Hypothyroidism   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  Consistent with equilibration.  This is above her baseline.  This patient does not need any GI workup without any sign of any active GI bleeding at this time.  I have explained this to the patient and her mother.  If the patient should have any sign of active GI bleeding please let me know.  I will sign off.  Please call if any further GI concerns or questions.  We would like to thank you for the opportunity to participate in the care of AKACIA BOLTZ.    LOS: 1 day   Rogelia Copping, MD.FACG 11/14/2023, 1:24 PM Pager [phone] 7am-5pm  Check AMION for 5pm -7am coverage and on weekends

## 2023-11-14 NOTE — Plan of Care (Signed)
  Problem: Respiratory: Goal: Ability to maintain adequate ventilation will improve Outcome: Progressing   Problem: Education: Goal: Knowledge of General Education information will improve Description: Including pain rating scale, medication(s)/side effects and non-pharmacologic comfort measures Outcome: Progressing   Problem: Health Behavior/Discharge Planning: Goal: Ability to manage health-related needs will improve Outcome: Progressing   Problem: Clinical Measurements: Goal: Respiratory complications will improve Outcome: Progressing   Problem: Activity: Goal: Risk for activity intolerance will decrease Outcome: Progressing   Problem: Nutrition: Goal: Adequate nutrition will be maintained Outcome: Progressing   Problem: Coping: Goal: Level of anxiety will decrease Outcome: Progressing   Problem: Elimination: Goal: Will not experience complications related to urinary retention Outcome: Progressing   Problem: Pain Managment: Goal: General experience of comfort will improve and/or be controlled Outcome: Progressing   Problem: Safety: Goal: Ability to remain free from injury will improve Outcome: Progressing   Problem: Skin Integrity: Goal: Risk for impaired skin integrity will decrease Outcome: Progressing

## 2023-11-14 NOTE — Progress Notes (Signed)
 PHARMACIST - PHYSICIAN COMMUNICATION  CONCERNING:  Enoxaparin  (Lovenox ) for DVT Prophylaxis    RECOMMENDATION: Patient was prescribed enoxaprin 40mg  q24 hours for VTE prophylaxis.   Filed Weights   11/13/23 1511 11/13/23 2038  Weight: 49.9 kg (110 lb) 59.7 kg (131 lb 9.8 oz)    Body mass index is 24.87 kg/m.  Estimated Creatinine Clearance: 29.3 mL/min (A) (by C-G formula based on SCr of 2.29 mg/dL (H)).  Patient is candidate for enoxaparin  30mg  every 24 hours based on CrCl <23ml/min or Weight <45kg  DESCRIPTION: Pharmacy has adjusted enoxaparin  dose per [facility] policy.  Patient is now receiving enoxaparin  30 mg every 24 hours    Kayla JULIANNA Blew, PharmD Clinical Pharmacist  11/14/2023 5:32 PM

## 2023-11-14 NOTE — Progress Notes (Signed)
  PROGRESS NOTE    Ashley Camacho  FMW:[id number] DOB: 1991-03-09 DOA: 11/13/2023 PCP: Ostwalt, [name], PA-C  [id number]  LOS: 1 day   Brief hospital course:   Assessment & Plan: Ashley Camacho is a 33 y.o. female with medical history significant of acute metabolic encephalopathy, renal insufficiency, anemia, CHF, gastroparesis, hypertension, hypotension, history intractable nausea and vomiting, migraine headaches, hypothyroidism, type 1 diabetes who was recently admitted on 10/31/2023 until 11/04/2023 due to sepsis norovirus and Enterococcus albumin  septicemia who presented today with generalized weakness, fever, fatigue and malaise.      Sepsis due to pneumonia (HCC) --started on vanc and cefepime .  MRSA screen neg --d/c vanc --cont cefepime    Chronic anemia --Hgb 4.8 on presentation, likely an error.  Hgb went up to 11.8 after 2u pRBC.    AKI (acute kidney injury) (HCC) CKD 3b --start MIVF     Hypomagnesemia   Hypophosphatemia   Hypokalemia --monitor and supplement PRN   Hypernatremia --Na 146 one time.  Not significant     Hypothyroidism Continue levothyroxine  75 mcg p.o. daily.     Essential hypertension Not on antihypertensives.  BP trending up. --start Lopressor  25 mg BID     Moderate protein malnutrition (HCC) In the setting of anemia and acute illness May benefit from protein supplementation.     Type 1 diabetes mellitus (HCC) Continue Lantus  10 units daily  --ACHS and SSI  Hx of adrenal insufficiency --cont home Cortef    DVT prophylaxis: Lovenox  SQ Code Status: Full code  Family Communication:  Level of care: Med-Surg Dispo:   The patient is from: home Anticipated d/c is to: home Anticipated d/c date is: 1-2 days   Subjective and Interval History:  Pt reported cough.  Started some diarrhea, no vomiting.   Objective: Vitals:   11/14/23 1200 11/14/23 1400 11/14/23 1500 11/14/23 1600  BP: (!) 168/89 (!) 160/93 (!) 154/85 (!) 151/90   Pulse: (!) 114 (!) 120 (!) 101 99  Resp: 20 (!) 27 (!) 30 (!) 32  Temp:    99.5 F (37.5 C)  TempSrc:    Oral  SpO2: 100% 99% 98% 99%  Weight:      Height:        Intake/Output Summary (Last 24 hours) at 11/14/2023 1723 Last data filed at 11/14/2023 0600 Gross per 24 hour  Intake 1500 ml  Output 250 ml  Net 1250 ml   Filed Weights   11/13/23 1511 11/13/23 2038  Weight: 49.9 kg 59.7 kg    Examination:   Constitutional: NAD, AAOx3 HEENT: conjunctivae and lids normal, EOMI CV: No cyanosis.   RESP: normal respiratory effort Neuro: II - XII grossly intact.   Psych: Normal mood and affect.  Appropriate judgement and reason   Data Reviewed: I have personally reviewed labs and imaging studies  Time spent: 50 minutes  Ellouise Haber, MD [facility] If 7PM-7AM, please contact night-coverage 11/14/2023, 5:23 PM

## 2023-11-14 NOTE — Progress Notes (Signed)
 Patient transferred. AxOx4. VSS. All patient belongings kept at bedside transferred with patient.

## 2023-11-15 DIAGNOSIS — A419 Sepsis, unspecified organism: Secondary | ICD-10-CM | POA: Diagnosis not present

## 2023-11-15 DIAGNOSIS — J189 Pneumonia, unspecified organism: Secondary | ICD-10-CM | POA: Diagnosis not present

## 2023-11-15 LAB — GLUCOSE, CAPILLARY
Glucose-Capillary: 85 mg/dL (ref 70–99)
Glucose-Capillary: 251 mg/dL — ABNORMAL HIGH (ref 70–99)
Glucose-Capillary: 299 mg/dL — ABNORMAL HIGH (ref 70–99)

## 2023-11-15 LAB — MAGNESIUM: Magnesium: 2.1 mg/dL (ref 1.7–2.4)

## 2023-11-15 LAB — BASIC METABOLIC PANEL WITH GFR
Anion gap: 11 (ref 5–15)
BUN: 36 mg/dL — ABNORMAL HIGH (ref 6–20)
CO2: 23 mmol/L (ref 22–32)
Calcium: 7.6 mg/dL — ABNORMAL LOW (ref 8.9–10.3)
Chloride: 110 mmol/L (ref 98–111)
Creatinine, Ser: 2.25 mg/dL — ABNORMAL HIGH (ref 0.44–1.00)
GFR, Estimated: 29 mL/min — ABNORMAL LOW (ref >60)
Glucose, Bld: 135 mg/dL — ABNORMAL HIGH (ref 70–99)
Potassium: 3.9 mmol/L (ref 3.5–5.1)
Sodium: 144 mmol/L (ref 135–145)

## 2023-11-15 LAB — CBC
HCT: 35.3 % — ABNORMAL LOW (ref 36.0–46.0)
Hemoglobin: 12 g/dL (ref 12.0–15.0)
MCH: 29.3 pg (ref 26.0–34.0)
MCHC: 34 g/dL (ref 30.0–36.0)
MCV: 86.3 fL (ref 80.0–100.0)
Platelets: 120 10*3/uL — ABNORMAL LOW (ref 150–400)
RBC: 4.09 MIL/uL (ref 3.87–5.11)
RDW: 15.9 % — ABNORMAL HIGH (ref 11.5–15.5)
WBC: 18.8 10*3/uL — ABNORMAL HIGH (ref 4.0–10.5)
nRBC: 0 % (ref 0.0–0.2)

## 2023-11-15 MED ORDER — AZITHROMYCIN 250 MG PO TABS
500.0000 mg | ORAL_TABLET | Freq: Every day | ORAL | Status: DC
  Administered 2023-11-15 – 2023-11-16 (×2): 500 mg via ORAL
  Filled 2023-11-15 (×2): qty 2

## 2023-11-15 NOTE — Progress Notes (Signed)
  PROGRESS NOTE    Ashley Camacho  FMW:[id number] DOB: February 13, 1991 DOA: 11/13/2023 PCP: Ostwalt, [name], PA-C  [id number]  LOS: 2 days   Brief hospital course:   Assessment & Plan: Ashley Camacho is a 33 y.o. female with medical history significant of acute metabolic encephalopathy, renal insufficiency, anemia, CHF, gastroparesis, hypertension, hypotension, history intractable nausea and vomiting, migraine headaches, hypothyroidism, type 1 diabetes who was recently admitted on 10/31/2023 until 11/04/2023 due to sepsis norovirus and Enterococcus albumin  septicemia who presented today with generalized weakness, fever, fatigue and malaise.      Sepsis due to pneumonia [facility]) --fever 103.2 on presentation, tachycardia, leukocytosis. --started on vanc and cefepime .  MRSA screen neg, Vanc d/c'ed. --cont cefepime  --start oral azithromycin  today   Chronic anemia --Hgb 4.8 on presentation, likely an error.  Hgb went up to 11.8 after 2u pRBC.    AKI (acute kidney injury) (HCC) CKD 3b --s/p MIVF --oral hydration now     Hypomagnesemia   Hypophosphatemia   Hypokalemia --monitor and supplement PRN   Hypernatremia --Na 146 one time.  Not significant     Hypothyroidism Continue levothyroxine  75 mcg p.o. daily.     Essential hypertension Not on antihypertensives.  BP trending up. --cont Lopressor  25 mg BID (new)     Moderate protein malnutrition (HCC) In the setting of anemia and acute illness May benefit from protein supplementation.     Type 1 diabetes mellitus (HCC) Continue Lantus  10 units daily  --ACHS and SSI  Hx of adrenal insufficiency --cont home Cortef    DVT prophylaxis: Lovenox  SQ Code Status: Full code  Family Communication:  Level of care: Med-Surg Dispo:   The patient is from: home Anticipated d/c is to: home Anticipated d/c date is: 6/26   Subjective and Interval History:  Still having cough, but dyspnea improved.   Objective: Vitals:   11/14/23  1949 11/15/23 0346 11/15/23 0737 11/15/23 1528  BP: 131/75 136/85 (!) 146/96 (!) 143/85  Pulse: 79 80 82 73  Resp: 20 17 19 18   Temp: 98.3 F (36.8 C) 97.7 F (36.5 C) 97.9 F (36.6 C) 97.8 F (36.6 C)  TempSrc:  Oral    SpO2: 97% 95% 99% 99%  Weight:      Height:        Intake/Output Summary (Last 24 hours) at 11/15/2023 1833 Last data filed at 11/15/2023 1300 Gross per 24 hour  Intake 1902.5 ml  Output --  Net 1902.5 ml   Filed Weights   11/13/23 1511 11/13/23 2038  Weight: 49.9 kg 59.7 kg    Examination:   Constitutional: NAD, AAOx3 HEENT: conjunctivae and lids normal, EOMI CV: No cyanosis.   RESP: normal respiratory effort, on RA Neuro: II - XII grossly intact.   Psych: Normal mood and affect.  Appropriate judgement and reason   Data Reviewed: I have personally reviewed labs and imaging studies  Time spent: 35 minutes  Ellouise Haber, MD [facility] If 7PM-7AM, please contact night-coverage 11/15/2023, 6:33 PM

## 2023-11-15 NOTE — TOC Initial Note (Signed)
 Transition of Care [facility]) - Initial/Assessment Note    Patient Details  Name: Ashley Camacho MRN: [id number] Date of Birth: 07-14-1990  Transition of Care [facility]) CM/SW Contact:    Lauraine [name], LCSW Phone Number: 11/15/2023, 4:20 PM  Clinical Narrative:  Readmission prevention screen complete. CSW met with patient. No family at bedside. CSW introduced role and explained that discharge planning would be discussed. PCP is [name], PA-C. Her roommate drives her to appointments. [facility] [address]. No issues obtaining medications. Patient lives with her roommate and her two children who also split time with her father. No home health or DME use prior to admission. No further concerns. CSW will continue to follow patient for support and facilitate return home once stable. Her roommate or mother will transport her home at discharge.               Expected Discharge Plan: Home/Self Care Barriers to Discharge: Continued Medical Work up   Patient Goals and CMS Choice            Expected Discharge Plan and Services     Post Acute Care Choice: NA Living arrangements for the past 2 months: Single Family Home                                      Prior Living Arrangements/Services Living arrangements for the past 2 months: Single Family Home Lives with:: Roommate, Minor Children Patient language and need for interpreter reviewed:: Yes Do you feel safe going back to the place where you live?: Yes      Need for Family Participation in Patient Care: Yes (Comment) Care giver support system in place?: Yes (comment)   Criminal Activity/Legal Involvement Pertinent to Current Situation/Hospitalization: No - Comment as needed  Activities of Daily Living   ADL Screening (condition at time of admission) Independently performs ADLs?: Yes (appropriate for developmental age) Is the patient deaf or have difficulty hearing?: No Does the patient have difficulty  seeing, even when wearing glasses/contacts?: No Does the patient have difficulty concentrating, remembering, or making decisions?: No  Permission Sought/Granted                  Emotional Assessment Appearance:: Appears stated age Attitude/Demeanor/Rapport: Engaged, Gracious Affect (typically observed): Accepting, Appropriate, Calm, Pleasant Orientation: : Oriented to Self, Oriented to Place, Oriented to  Time, Oriented to Situation Alcohol / Substance Use: Not Applicable Psych Involvement: No (comment)  Admission diagnosis:  Acute respiratory failure with hypoxia (HCC) [J96.01] Sepsis due to pneumonia (HCC) [J18.9, A41.9] Anemia, unspecified type [D64.9] Sepsis without acute organ dysfunction, due to unspecified organism [facility]) [A41.9] Patient Active Problem List   Diagnosis Date Noted   Sepsis due to pneumonia (HCC) 11/13/2023   ABLA (acute blood loss anemia) 11/13/2023   Moderate protein malnutrition (HCC) 11/13/2023   Hyponatremia 11/13/2023   Norovirus 11/02/2023   Sepsis secondary to UTI (HCC) 10/31/2023   Sepsis due to urinary tract infection (HCC) 10/31/2023   Stage 3b chronic kidney disease (HCC) 10/25/2023   Depression, recurrent (HCC) 10/25/2023   Tinnitus of right ear 10/25/2023   Encounter for central line placement 10/10/2023   History of DVT (deep vein thrombosis) 05/26/2023   Lactic acid acidosis 05/26/2023   Depression 04/07/2023   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   Malnutrition of moderate degree 03/24/2023   Type 1 diabetes mellitus with hyperosmolar hyperglycemic state (  HHS) (HCC) 03/23/2023   Diabetes mellitus type 1 with hyperosmolarity (HCC) 03/23/2023   Anemia    Anemia in chronic kidney disease (CKD) 12/30/2022   AKI (acute kidney injury) (HCC) 11/22/2022   Dislodged jejunostomy tube 11/22/2022   Type 1 diabetes mellitus (HCC) 11/22/2022   DVT (deep venous thrombosis) (HCC) 11/20/2022   Failure to thrive in adult 11/13/2022   Hyperglycemia  11/13/2022   Intractable nausea and vomiting 11/13/2022   Normocytic anemia 11/12/2022   Severe recurrent major depression (HCC) 11/03/2022   CKD stage 3a, GFR 45-59 ml/min (HCC) 11/01/2022   Elevated serum creatinine 11/01/2022   Abdominal pain 11/01/2022   Cystitis 11/01/2022   Acute exacerbation of congestive heart failure (HCC) 08/23/2022   Diabetic ketoacidosis (HCC) 08/23/2022   Hypothermia 07/27/2022   CKD stage 3b, GFR 30-44 ml/min (HCC) 06/29/2022   Hypovolemic shock (HCC) 06/28/2022   Jejunostomy tube present (HCC) 06/28/2022   Insomnia 06/20/2022   Hypophosphatemia 06/13/2022   Severe malnutrition (HCC) 06/13/2022   Hypotension 06/10/2022   Pseudohyponatremia 06/10/2022   Hypomagnesemia 06/10/2022   Adrenal insufficiency (HCC) 06/09/2022   Gastroenteritis 02/28/2022   Hypokalemia 02/28/2022   Hypocalcemia 02/28/2022   Metabolic acidosis 02/28/2022   Essential hypertension 02/28/2022   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   Nausea & vomiting 02/28/2022   High anion gap metabolic acidosis 01/02/2022   UTI (urinary tract infection) 11/03/2021   Hypoglycemia due to type 1 diabetes mellitus (HCC) 11/02/2021   Thrombocytopenia (HCC) 11/02/2021   Problem related to social environment 09/24/2021   Rhinovirus 09/22/2021   Acute on chronic HFrEF (heart failure with reduced ejection fraction) (HCC) 09/07/2021   Generalized anxiety disorder 09/01/2021   Pneumonia due to human metapneumovirus 08/31/2021   Gastro-esophageal reflux disease with esophagitis 08/31/2021   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   Congestive heart failure (HCC) 08/31/2021   Hypoglycemia due to insulin  06/14/2021   DKA (diabetic ketoacidosis) (HCC) 06/14/2021   CKD stage 3 due to type 1 diabetes mellitus (HCC) 06/14/2021   Transaminitis 06/14/2021   Hypoglycemia associated with diabetes (HCC) 06/03/2021   DKA, type 1 (HCC) 05/20/2021   Major depressive disorder, recurrent episode, moderate  (HCC) 05/08/2021   Right arm pain 05/07/2021   Rash of hand 05/07/2021   Hyperkalemia 05/06/2021   History of adrenal insufficiency 05/06/2021   CAP (community acquired pneumonia) 05/05/2021   Hypoglycemia 04/27/2021   Combined systolic and diastolic heart failure (HCC) 03/11/2021   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) 03/11/2021   Protein-calorie malnutrition, severe (HCC) 03/11/2021   Adrenal insufficiency (Addison's disease) (HCC) 03/10/2021   Postoperative hematoma involving genitourinary system following genitourinary procedure    S/P cesarean section 02/17/2021   Acute esophagitis    Leg edema    History of preterm delivery, currently pregnant    Pre-existing type 1 diabetes mellitus during pregnancy in third trimester    Anemia of chronic disease    Type 1 diabetes mellitus affecting pregnancy in second trimester, antepartum    HFrEF (heart failure with reduced ejection fraction) (HCC)    Acute deep vein thrombosis (DVT) of brachial vein of right upper extremity (HCC) 12/17/2020   Anxiety 12/17/2020   Diabetic retinopathy (HCC) 12/17/2020   Chronic combined systolic and diastolic CHF (congestive heart failure) (HCC)    Hyperemesis 12/13/2020   Type 1 diabetes mellitus with hypoglycemia without coma (HCC) 12/13/2020   Sunburn 12/13/2020   Hypothyroidism 12/10/2020   Malnutrition (HCC) 12/10/2020   Vitreous hemorrhage of right eye (HCC) 12/10/2020  Pleural effusion associated with pulmonary infection 11/25/2020   Pneumonia affecting pregnancy 11/24/2020   Diabetes mellitus affecting pregnancy, second trimester 11/24/2020   Edema 11/24/2020   Decreased urine output 11/24/2020   Shortness of breath    Zinc  deficiency    SOB (shortness of breath)    Social problem 11/21/2020   Nausea and vomiting during pregnancy prior to [redacted] weeks gestation 11/21/2020   Low serum vitamin B12    Delivery with history of C-section 11/20/2020   Absolute  anemia    Acute kidney injury superimposed on chronic kidney disease (HCC)    Nausea and vomiting during pregnancy 10/23/2020   History of premature delivery, currently pregnant, second trimester 10/13/2020   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 [place] [address] Phone: [phone] Fax: [phone]     Social Drivers of Health (SDOH) Social History: SDOH Screenings   Food Insecurity: No Food Insecurity (11/13/2023)  Recent Concern: Food Insecurity - Food Insecurity Present (10/31/2023)  Housing: Low Risk  (11/13/2023)  Transportation Needs: No Transportation Needs (11/13/2023)  Recent Concern: Transportation Needs - Unmet Transportation Needs (10/31/2023)  Utilities: Not At Risk (11/13/2023)  Depression (PHQ2-9): Medium Risk (10/21/2023)  Financial Resource Strain: Low Risk  (08/04/2023)   Received from [facility]  Recent Concern: Financial Resource Strain - High Risk (05/09/2023)  Physical Activity: Unknown (05/09/2023)  Social Connections: Socially Isolated (10/31/2023)  Stress: Stress Concern Present (05/09/2023)  Tobacco Use: Low Risk  (10/31/2023)   SDOH Interventions:     Readmission Risk Interventions    11/15/2023    4:19 PM 11/04/2023   10:48 AM 04/07/2023   11:07 AM  Readmission Risk Prevention Plan  Transportation Screening Complete Complete Complete  Medication Review Oceanographer) Complete Complete Complete  PCP or Specialist appointment within 3-5 days of discharge Complete Complete Complete  HRI or Home Care Consult  Not Complete   HRI or Home Care Consult Pt Refusal  Comments  n/a   SW Recovery Care/Counseling Consult Complete Complete Complete  Palliative Care Screening Not Applicable Not Applicable Not Applicable  Skilled Nursing Facility Not Applicable Not Applicable Not Applicable

## 2023-11-15 NOTE — Plan of Care (Signed)
   Problem: Fluid Volume: Goal: Hemodynamic stability will improve Outcome: Progressing   Problem: Clinical Measurements: Goal: Diagnostic test results will improve Outcome: Progressing Goal: Signs and symptoms of infection will decrease Outcome: Progressing   Problem: Respiratory: Goal: Ability to maintain adequate ventilation will improve Outcome: Progressing   Problem: Education: Goal: Knowledge of General Education information will improve Description: Including pain rating scale, medication(s)/side effects and non-pharmacologic comfort measures Outcome: Progressing   Problem: Health Behavior/Discharge Planning: Goal: Ability to manage health-related needs will improve Outcome: Progressing   Problem: Clinical Measurements: Goal: Ability to maintain clinical measurements within normal limits will improve Outcome: Progressing Goal: Will remain free from infection Outcome: Progressing Goal: Diagnostic test results will improve Outcome: Progressing Goal: Respiratory complications will improve Outcome: Progressing Goal: Cardiovascular complication will be avoided Outcome: Progressing   Problem: Activity: Goal: Risk for activity intolerance will decrease Outcome: Progressing   Problem: Nutrition: Goal: Adequate nutrition will be maintained Outcome: Progressing   Problem: Coping: Goal: Level of anxiety will decrease Outcome: Progressing   Problem: Elimination: Goal: Will not experience complications related to bowel motility Outcome: Progressing Goal: Will not experience complications related to urinary retention Outcome: Progressing   Problem: Pain Managment: Goal: General experience of comfort will improve and/or be controlled Outcome: Progressing   Problem: Safety: Goal: Ability to remain free from injury will improve Outcome: Progressing   Problem: Skin Integrity: Goal: Risk for impaired skin integrity will decrease Outcome: Progressing   Problem:  Education: Goal: Ability to describe self-care measures that may prevent or decrease complications (Diabetes Survival Skills Education) will improve Outcome: Progressing Goal: Individualized Educational Video(s) Outcome: Progressing   Problem: Coping: Goal: Ability to adjust to condition or change in health will improve Outcome: Progressing   Problem: Fluid Volume: Goal: Ability to maintain a balanced intake and output will improve Outcome: Progressing   Problem: Health Behavior/Discharge Planning: Goal: Ability to identify and utilize available resources and services will improve Outcome: Progressing Goal: Ability to manage health-related needs will improve Outcome: Progressing   Problem: Metabolic: Goal: Ability to maintain appropriate glucose levels will improve Outcome: Progressing   Problem: Nutritional: Goal: Maintenance of adequate nutrition will improve Outcome: Progressing Goal: Progress toward achieving an optimal weight will improve Outcome: Progressing   Problem: Skin Integrity: Goal: Risk for impaired skin integrity will decrease Outcome: Progressing   Problem: Tissue Perfusion: Goal: Adequacy of tissue perfusion will improve Outcome: Progressing

## 2023-11-15 NOTE — Plan of Care (Signed)
  Problem: Clinical Measurements: Goal: Diagnostic test results will improve Outcome: Progressing Goal: Signs and symptoms of infection will decrease Outcome: Progressing   Problem: Respiratory: Goal: Ability to maintain adequate ventilation will improve Outcome: Progressing   Problem: Clinical Measurements: Goal: Ability to maintain clinical measurements within normal limits will improve Outcome: Progressing Goal: Will remain free from infection Outcome: Progressing Goal: Diagnostic test results will improve Outcome: Progressing Goal: Respiratory complications will improve Outcome: Progressing Goal: Cardiovascular complication will be avoided Outcome: Progressing   Problem: Coping: Goal: Level of anxiety will decrease Outcome: Progressing

## 2023-11-16 DIAGNOSIS — J189 Pneumonia, unspecified organism: Secondary | ICD-10-CM | POA: Diagnosis not present

## 2023-11-16 DIAGNOSIS — A419 Sepsis, unspecified organism: Secondary | ICD-10-CM | POA: Diagnosis not present

## 2023-11-16 LAB — CBC
HCT: 32.1 % — ABNORMAL LOW (ref 36.0–46.0)
Hemoglobin: 10.7 g/dL — ABNORMAL LOW (ref 12.0–15.0)
MCH: 29.6 pg (ref 26.0–34.0)
MCHC: 33.3 g/dL (ref 30.0–36.0)
MCV: 88.7 fL (ref 80.0–100.0)
Platelets: 118 10*3/uL — ABNORMAL LOW (ref 150–400)
RBC: 3.62 MIL/uL — ABNORMAL LOW (ref 3.87–5.11)
RDW: 16.1 % — ABNORMAL HIGH (ref 11.5–15.5)
WBC: 12.9 10*3/uL — ABNORMAL HIGH (ref 4.0–10.5)
nRBC: 0 % (ref 0.0–0.2)

## 2023-11-16 LAB — BASIC METABOLIC PANEL WITH GFR
Anion gap: 10 (ref 5–15)
BUN: 39 mg/dL — ABNORMAL HIGH (ref 6–20)
CO2: 23 mmol/L (ref 22–32)
Calcium: 7.9 mg/dL — ABNORMAL LOW (ref 8.9–10.3)
Chloride: 112 mmol/L — ABNORMAL HIGH (ref 98–111)
Creatinine, Ser: 2.35 mg/dL — ABNORMAL HIGH (ref 0.44–1.00)
GFR, Estimated: 28 mL/min — ABNORMAL LOW (ref >60)
Glucose, Bld: 169 mg/dL — ABNORMAL HIGH (ref 70–99)
Potassium: 3.7 mmol/L (ref 3.5–5.1)
Sodium: 145 mmol/L (ref 135–145)

## 2023-11-16 LAB — GLUCOSE, CAPILLARY
Glucose-Capillary: 223 mg/dL — ABNORMAL HIGH (ref 70–99)
Glucose-Capillary: 134 mg/dL — ABNORMAL HIGH (ref 70–99)
Glucose-Capillary: 203 mg/dL — ABNORMAL HIGH (ref 70–99)

## 2023-11-16 MED ORDER — METOPROLOL TARTRATE 25 MG PO TABS: 25.0000 mg | ORAL_TABLET | Freq: Two times a day (BID) | ORAL | 0 refills | Status: DC

## 2023-11-16 MED ORDER — PANTOPRAZOLE SODIUM 40 MG PO TBEC: 40.0000 mg | DELAYED_RELEASE_TABLET | Freq: Every day | ORAL | Status: DC

## 2023-11-16 MED ORDER — LEVOFLOXACIN 500 MG PO TABS: 500.0000 mg | ORAL_TABLET | Freq: Every day | ORAL | 0 refills | Status: AC

## 2023-11-16 NOTE — Inpatient Diabetes Management (Signed)
 Inpatient Diabetes Program Recommendations  AACE/ADA: New Consensus Statement on Inpatient Glycemic Control (2015)  Target Ranges:  Prepandial:   less than 140 mg/dL      Peak postprandial:   less than 180 mg/dL (1-2 hours)      Critically ill patients:  140 - 180 mg/dL    Latest Reference Range & Units 11/15/23 07:43 11/15/23 11:44 11/15/23 15:30 11/15/23 20:35  Glucose-Capillary 70 - 99 mg/dL 85 748 (H)  5 units Novolog  @1323  223 (H)  3 units Novolog  @1813  299 (H)  3 units Novolog   10 units Semglee   (H): Data is abnormally high  Latest Reference Range & Units 11/16/23 07:39 11/16/23 11:25  Glucose-Capillary 70 - 99 mg/dL 865 (H)  1 unit Novolog   203 (H)  (H): Data is abnormally high    History: Type 1 Diabetes, Gastroparesis, Adrenal Insufficiency  Home DM Meds: Lantus  10 units at HS      Novolog  2 units TID      Novolog  2-11 units TID per SSI      Dexcom G7 CGM      PO Cortef  at home (for Adrenal Insuff)   Current Orders: Semglee  10 units daily     Novolog  0-9 units TID ac/hs     PO Cortef  20 mg AM/ 10 mg PM   MD- Please consider adding Novolog  2 units TID with meals HOLD if pt NPO HOLD if pt eats <50% meals    --Will follow patient during hospitalization--  Adina Rudolpho Arrow RN, MSN, CDCES Diabetes Coordinator Inpatient Glycemic Control Team Team Pager: [phone] (8a-5p)

## 2023-11-16 NOTE — Discharge Summary (Signed)
 Physician Discharge Summary  Ashley Camacho FMW:[id number] DOB: 10-14-1990 DOA: 11/13/2023  PCP: Ostwalt, [name], PA-C  Admit date: 11/13/2023 Discharge date: 11/16/2023  Admitted From: home  Disposition:  home   Recommendations for Outpatient Follow-up:  Follow up with PCP in 1-2 weeks F/u w/ nephro at previously scheduled appointment    Home Health: no  Equipment/Devices:  Discharge Condition: stable  CODE STATUS: full  Diet recommendation: Carb Modified   Brief/Interim Summary: HPI was taken from Dr. Celinda: Ashley Camacho is a 33 y.o. female with medical history significant of acute metabolic encephalopathy, renal insufficiency, anemia, CHF, gastroparesis, hypertension, hypotension, history intractable nausea and vomiting, migraine headaches, hypothyroidism, type 1 diabetes who was recently admitted on 10/31/2023 until 11/04/2023 due to sepsis norovirus and Enterococcus albumin  septicemia who presented today with generalized weakness, fever, fatigue and malaise. He denied rhinorrhea, sore throat, wheezing or hemoptysis. No chest pain, palpitations, diaphoresis, PND, orthopnea or pitting edema of the lower extremities. No abdominal pain, nausea, emesis, diarrhea, constipation, melena or hematochezia. No flank pain, dysuria, frequency or hematuria. No polyuria, polydipsia or polyphagia.   Lab work: Coronavirus, influenza and RSV PCR was negative.  CBC showed white count 16.9, hemoglobin 4.8 g/dL (11 days ago hemoglobin was 9.1 g/dL) and platelets 841.  PT 15.9 and INR 1.3.  Lactic acid was normal.  Phosphorus 2.4 magnesium  1.4 mg/dL.  CMP showed a sodium 146, potassium 3.4, chloride 112 and CO2 25 mmol/L.  Glucose 108, BUN 32 and creatinine 2.25 mg/dL.  Magnesium  normalizes after correction.  Total protein is 5.0 and albumin  2.8 g/dL.  Transaminases and bilirubin were normal.  Alkaline phosphatase was only 28 units/L.   Imaging: Portable 1 view chest radiograph showing multifocal airspace  disease compatible with pneumonia.   ED course: Initial vital signs were temperature 39.6 F, pulse 138, respiration 16, BP 150/78 mmHg O2 sat 76% on room air.  O2 sat increased to 98% on nasal cannula oxygen.  She received 2 units of PRBC, acetaminophen  1000 mg p.o. x 1, cefepime  2 g IVPB, metronidazole  500 mg IVP, ondansetron  4 mg IVP, LR 1000 mL bolus and vancomycin .  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 Met criteria w/ fever, tachycardia, leukocytosis & pneumonia. Continue on IV cefepime , azithromycin . D/c home on po levaquin to complete the course. Sepsis resolved   Chronic anemia: Hb 4.8 on presentation, likely a lab error. S/p 2 units of pRBCs transfused.    AKI on CKDIIIb: Cr is labile. Continue on IVFs. Has an appointment w/ nephro outpatient in July 2025  Hypomagnesemia: already repleated  Hypophosphatemia: already repleated  Hypokalemia: WNL today    Hypernatremia: resolved    Hypothyroidism: continue on home dose of levothyroxine    HTN: started and will continue on metoprolol . Was not on anti-HTN meds at home    Moderate protein malnutrition: continue on po supplements   DM1: poorly controlled, HbA1c 8.0. Continue on glargine, SSI w/ accuchecks   Hx of adrenal insufficiency: continue on home dose of cortef    Discharge Instructions  Discharge Instructions     Diet Carb Modified  Complete by: As directed    Discharge instructions   Complete by: As directed    F/u w/ PCP in 1-2 weeks   Increase activity slowly   Complete by: As directed       Allergies as of 11/16/2023   No  Known Allergies      Medication List     TAKE these medications    acetaminophen  500 MG tablet Commonly known as: TYLENOL  Take 2 tablets (1,000 mg total) by mouth every 6 (six) hours as needed for headache, fever or moderate pain.   Baqsimi One Pack 3 MG/DOSE Powd Generic drug: Glucagon Place 3 mg into the nose as needed.   Blood Pressure Cuff Misc Use as directed   cholecalciferol  25 MCG (1000 UNIT) tablet Commonly known as: VITAMIN D3   Creon  24000-76000 units Cpep Generic drug: Pancrelipase  (Lip-Prot-Amyl) Take 1 capsule (24,000 Units total) by mouth 3 (three) times daily before meals.   Dexcom G7 Sensor Misc SMARTSIG:1 Each Every 10 Days   divalproex  250 MG DR tablet Commonly known as: DEPAKOTE  Take 250 mg by mouth daily.   hydrocortisone  10 MG tablet Commonly known as: CORTEF  Take 2 tablets (20 mg total) by mouth daily with breakfast AND 1 tablet (10 mg total) daily with supper.   insulin  aspart 100 UNIT/ML FlexPen Commonly known as: NOVOLOG  Inject 2 Units into the skin 3 (three) times daily with meals. If eating and Blood Glucose (BG) 80 or higher inject 2 units for meal coverage and add correction dose per scale. If not eating, correction dose only. BG <150= 0 unit; BG 150-200= 1 unit; BG 201-250= 3 unit; BG 251-300= 5 unit; BG 301-350= 7 unit; BG 351-400= 9 unit; BG >400= 11 unit and Call Primary Care.   insulin  glargine 100 UNIT/ML Solostar Pen Commonly known as: LANTUS  Inject 10 Units into the skin daily.   levofloxacin 500 MG tablet Commonly known as: LEVAQUIN Take 1 tablet (500 mg total) by mouth daily for 4 days.   levothyroxine  75 MCG tablet Commonly known as: SYNTHROID  TAKE 1 TABLET (75 MCG TOTAL) BY MOUTH DAILY AT 6 (SIX) AM.   metoprolol  tartrate 25 MG tablet Commonly known as: LOPRESSOR  Take 1 tablet (25 mg total) by mouth 2 (two) times daily.   Pen Needles 31G X 6 MM Misc PEN NEEDLES 31G X 6 MM   BD Pen Needle Mini Ultrafine 31G X 5 MM  Misc Generic drug: Insulin  Pen Needle by Other route.   saccharomyces boulardii 250 MG capsule Commonly known as: FLORASTOR Take 250 mg by mouth 2 (two) times daily.        No Known Allergies  Consultations:    Procedures/Studies: DG Chest Port 1 View Result Date: 11/13/2023 CLINICAL DATA:  Sepsis.  Generalized weakness with cough and cold. EXAM: PORTABLE CHEST 1 VIEW COMPARISON:  10/31/2023 FINDINGS: Heart size and mediastinal contours are normal. No pleural effusion. Multifocal airspace disease is noted within the upper and lower right lung and left lower lung. Visualized osseous structures are unremarkable. IMPRESSION: Multifocal airspace disease compatible with pneumonia. Electronically Signed   By: Waddell Calk M.D.   On: 11/13/2023 12:11   ECHOCARDIOGRAM COMPLETE Result Date: 11/03/2023    ECHOCARDIOGRAM REPORT   Patient Name:   ADDISON FREIMUTH Date of Exam: 11/02/2023 Medical Rec #:  [id number]       Height:       61.0 in Accession #:    [id number]      Weight:  108.0 lb Date of Birth:  February 09, 1991      BSA:          1.454 m Patient Age:    32 years        BP:           162/102 mmHg Patient Gender: F               HR:           63 bpm. Exam Location:  [facility] Procedure: 2D Echo, 3D Echo, Cardiac Doppler, Color Doppler and Strain Analysis            (Both Spectral and Color Flow Doppler were utilized during            procedure). Indications:     R78.81 Bacteremia  History:         Patient has prior history of Echocardiogram examinations, most                  recent 07/20/2023. CHF; Risk Factors:Hypertension and Diabetes.  Sonographer:     Carl Coma RDCS Referring Phys:  [id number] DAVID P FITZGERALD Diagnosing Phys: Deatrice Cage MD IMPRESSIONS  1. Left ventricular ejection fraction, by estimation, is 55 to 60%. The left ventricle has normal function. The left ventricle has no regional wall motion abnormalities. Left ventricular diastolic parameters were normal. The average  left ventricular global longitudinal strain is -23.4 %. The global longitudinal strain is normal.  2. Right ventricular systolic function is normal. The right ventricular size is normal. There is normal pulmonary artery systolic pressure.  3. The mitral valve is normal in structure. Mild to moderate mitral valve regurgitation. No evidence of mitral stenosis.  4. The aortic valve is normal in structure. Aortic valve regurgitation is not visualized. No aortic stenosis is present.  5. The inferior vena cava is normal in size with greater than 50% respiratory variability, suggesting right atrial pressure of 3 mmHg. Conclusion(s)/Recommendation(s): No evidence of valvular vegetations on this transthoracic echocardiogram. FINDINGS  Left Ventricle: Left ventricular ejection fraction, by estimation, is 55 to 60%. The left ventricle has normal function. The left ventricle has no regional wall motion abnormalities. The average left ventricular global longitudinal strain is -23.4 %. Strain was performed and the global longitudinal strain is normal. The left ventricular internal cavity size was normal in size. There is no left ventricular hypertrophy. Left ventricular diastolic parameters were normal. Right Ventricle: The right ventricular size is normal. No increase in right ventricular wall thickness. Right ventricular systolic function is normal. There is normal pulmonary artery systolic pressure. The tricuspid regurgitant velocity is 2.54 m/s, and  with an assumed right atrial pressure of 3 mmHg, the estimated right ventricular systolic pressure is 28.8 mmHg. Left Atrium: Left atrial size was normal in size. Right Atrium: Right atrial size was normal in size. Pericardium: There is no evidence of pericardial effusion. Mitral Valve: The mitral valve is normal in structure. Mild to moderate mitral valve regurgitation. No evidence of mitral valve stenosis. Tricuspid Valve: The tricuspid valve is normal in structure. Tricuspid  valve regurgitation is mild . No evidence of tricuspid stenosis. Aortic Valve: The aortic valve is normal in structure. Aortic valve regurgitation is not visualized. No aortic stenosis is present. Pulmonic Valve: The pulmonic valve was normal in structure. Pulmonic valve regurgitation is mild. No evidence of pulmonic stenosis. Aorta: The aortic root is normal in size and structure. Venous: The inferior vena cava is normal in size with greater than 50%  respiratory variability, suggesting right atrial pressure of 3 mmHg. IAS/Shunts: No atrial level shunt detected by color flow Doppler. Additional Comments: 3D was performed not requiring image post processing on an independent workstation and was normal.  LEFT VENTRICLE PLAX 2D LVIDd:         4.60 cm   Diastology LVIDs:         3.20 cm   LV e' medial:    9.51 cm/s LV PW:         0.60 cm   LV E/e' medial:  12.9 LV IVS:        0.50 cm   LV e' lateral:   14.63 cm/s LVOT diam:     1.60 cm   LV E/e' lateral: 8.4 LV SV:         49 LV SV Index:   33        2D Longitudinal Strain LVOT Area:     2.01 cm  2D Strain GLS (A4C):   -24.5 %                          2D Strain GLS (A3C):   -22.1 %                          2D Strain GLS (A2C):   -23.5 %                          2D Strain GLS Avg:     -23.4 %                           3D Volume EF:                          3D EF:        55 %                          LV EDV:       131 ml                          LV ESV:       59 ml                          LV SV:        71 ml RIGHT VENTRICLE             IVC RV Basal diam:  3.30 cm     IVC diam: 1.10 cm RV S prime:     11.85 cm/s TAPSE (M-mode): 2.4 cm LEFT ATRIUM             Index        RIGHT ATRIUM           Index LA diam:        4.10 cm 2.82 cm/m   RA Area:     12.70 cm LA Vol (A2C):   44.0 ml 30.27 ml/m  RA Volume:   33.10 ml  22.77 ml/m LA Vol (A4C):   39.3 ml 27.04 ml/m LA Biplane Vol: 42.9 ml 29.51 ml/m  AORTIC VALVE LVOT Vmax:   98.50 cm/s LVOT Vmean:  65.750 cm/s LVOT VTI:  0.242 m  AORTA Ao Root diam: 2.70 cm Ao Asc diam:  2.80 cm MITRAL VALVE                TRICUSPID VALVE MV Area (PHT): 4.58 cm     TR Peak grad:   25.8 mmHg MV Decel Time: 166 msec     TR Vmax:        254.00 cm/s MV E velocity: 122.50 cm/s MV A velocity: 95.30 cm/s   SHUNTS MV E/A ratio:  1.29         Systemic VTI:  0.24 m                             Systemic Diam: 1.60 cm Deatrice Cage MD Electronically signed by Deatrice Cage MD Signature Date/Time: 11/03/2023/7:45:54 AM    Final    CT ABDOMEN PELVIS W CONTRAST Result Date: 10/31/2023 CLINICAL DATA:  Bowel obstruction suspected EXAM: CT ABDOMEN AND PELVIS WITH CONTRAST TECHNIQUE: Multidetector CT imaging of the abdomen and pelvis was performed using the standard protocol following bolus administration of intravenous contrast. RADIATION DOSE REDUCTION: This exam was performed according to the departmental dose-optimization program which includes automated exposure control, adjustment of the mA and/or kV according to patient size and/or use of iterative reconstruction technique. CONTRAST:  75mL OMNIPAQUE  IOHEXOL  300 MG/ML  SOLN COMPARISON:  07/23/2023 FINDINGS: Lower Chest: Normal. Hepatobiliary: Normal hepatic contours. No intra- or extrahepatic biliary dilatation. The gallbladder is normal. Pancreas: Normal pancreas. No ductal dilatation or peripancreatic fluid collection. Spleen: Normal. Adrenals/Urinary Tract: The adrenal glands are normal. No hydronephrosis, nephroureterolithiasis or solid renal mass. Mild wall thickening, possibly exaggerated by underdistention. Stomach/Bowel: There is no hiatal hernia. Normal duodenal course and caliber. No small bowel dilatation or inflammation. No focal colonic abnormality. The appendix is not visualized. No right lower quadrant inflammation or free fluid. Vascular/Lymphatic: Normal course and caliber of the major abdominal vessels. No abdominal or pelvic lymphadenopathy. Reproductive: Normal uterus. No adnexal mass.  Other: None. Musculoskeletal: No bony spinal canal stenosis or focal osseous abnormality. IMPRESSION: 1. No acute abnormality of the abdomen or pelvis. 2. Mild wall thickening of the urinary bladder, possibly exaggerated by underdistention. Correlate with urinalysis to exclude cystitis. Electronically Signed   By: Franky Stanford M.D.   On: 10/31/2023 04:24   DG Chest Port 1 View Result Date: 10/31/2023 CLINICAL DATA:  Sepsis EXAM: PORTABLE CHEST 1 VIEW COMPARISON:  05/26/2023 FINDINGS: The heart size and mediastinal contours are within normal limits. Both lungs are clear. The visualized skeletal structures are unremarkable. IMPRESSION: No active disease. Electronically Signed   By: Franky Stanford M.D.   On: 10/31/2023 03:18   (Echo, Carotid, EGD, Colonoscopy, ERCP)    Subjective: Pt c/o fatigue.    Discharge Exam: Vitals:   11/16/23 0351 11/16/23 0739  BP: (!) 150/89 (!) 163/93  Pulse: 78 85  Resp: 16 16  Temp: 97.8 F (36.6 C) 97.8 F (36.6 C)  SpO2: 100% 97%   Vitals:   11/15/23 1528 11/15/23 2030 11/16/23 0351 11/16/23 0739  BP: (!) 143/85 (!) 155/93 (!) 150/89 (!) 163/93  Pulse: 73 81 78 85  Resp: 18 14 16 16   Temp: 97.8 F (36.6 C) 97.8 F (36.6 C) 97.8 F (36.6 C) 97.8 F (36.6 C)  TempSrc:      SpO2: 99% 99% 100% 97%  Weight:      Height:        General: Pt is  alert, awake, not in acute distress Cardiovascular: S1/S2 +, no rubs, no gallops Respiratory: decreased breath sounds b/l  Abdominal: Soft, NT, ND, bowel sounds + Extremities: no edema, no cyanosis    The results of significant diagnostics from this hospitalization (including imaging, microbiology, ancillary and laboratory) are listed below for reference.     Microbiology: Recent Results (from the past 240 hours)  Resp panel by RT-PCR (RSV, Flu A&B, Covid) Anterior Nasal Swab     Status: None   Collection Time: 11/13/23 11:45 AM   Specimen: Anterior Nasal Swab  Result Value Ref Range Status   SARS  Coronavirus 2 by RT PCR NEGATIVE NEGATIVE Final    Comment: (NOTE) SARS-CoV-2 target nucleic acids are NOT DETECTED.  The SARS-CoV-2 RNA is generally detectable in upper respiratory specimens during the acute phase of infection. The lowest concentration of SARS-CoV-2 viral copies this assay can detect is 138 copies/mL. A negative result does not preclude SARS-Cov-2 infection and should not be used as the sole basis for treatment or other patient management decisions. A negative result may occur with  improper specimen collection/handling, submission of specimen other than nasopharyngeal swab, presence of viral mutation(s) within the areas targeted by this assay, and inadequate number of viral copies(<138 copies/mL). A negative result must be combined with clinical observations, patient history, and epidemiological information. The expected result is Negative.  Fact Sheet for Patients:  BloggerCourse.com  Fact Sheet for Healthcare Providers:  SeriousBroker.it  This test is no t yet approved or cleared by the [place]  FDA and  has been authorized for detection and/or diagnosis of SARS-CoV-2 by FDA under an Emergency Use Authorization (EUA). This EUA will remain  in effect (meaning this test can be used) for the duration of the COVID-19 declaration under Section 564(b)(1) of the Act, 21 U.S.C.section 360bbb-3(b)(1), unless the authorization is terminated  or revoked sooner.       Influenza A by PCR NEGATIVE NEGATIVE Final   Influenza B by PCR NEGATIVE NEGATIVE Final    Comment: (NOTE) The Xpert Xpress SARS-CoV-2/FLU/RSV plus assay is intended as an aid in the diagnosis of influenza from Nasopharyngeal swab specimens and should not be used as a sole basis for treatment. Nasal washings and aspirates are unacceptable for Xpert Xpress SARS-CoV-2/FLU/RSV testing.  Fact Sheet for  Patients: BloggerCourse.com  Fact Sheet for Healthcare Providers: SeriousBroker.it  This test is not yet approved or cleared by the [place]  FDA and has been authorized for detection and/or diagnosis of SARS-CoV-2 by FDA under an Emergency Use Authorization (EUA). This EUA will remain in effect (meaning this test can be used) for the duration of the COVID-19 declaration under Section 564(b)(1) of the Act, 21 U.S.C. section 360bbb-3(b)(1), unless the authorization is terminated or revoked.     Resp Syncytial Virus by PCR NEGATIVE NEGATIVE Final    Comment: (NOTE) Fact Sheet for Patients: BloggerCourse.com  Fact Sheet for Healthcare Providers: SeriousBroker.it  This test is not yet approved or cleared by the [place]  FDA and has been authorized for detection and/or diagnosis of SARS-CoV-2 by FDA under an Emergency Use Authorization (EUA). This EUA will remain in effect (meaning this test can be used) for the duration of the COVID-19 declaration under Section 564(b)(1) of the Act, 21 U.S.C. section 360bbb-3(b)(1), unless the authorization is terminated or revoked.  Performed at [facility], [address] [place], [place] [address]   Blood Culture (routine x 2)     Status: None (Preliminary result)  Collection Time: 11/13/23 11:46 AM   Specimen: BLOOD  Result Value Ref Range Status   Specimen Description BLOOD BLOOD LEFT WRIST  Final   Special Requests   Final    BOTTLES DRAWN AEROBIC ONLY Blood Culture results may not be optimal due to an inadequate volume of blood received in culture bottles   Culture   Final    NO GROWTH 3 DAYS Performed at [facility], [address], [place], [place] [address]    Report Status PENDING  Incomplete  Blood Culture (routine x 2)     Status: None (Preliminary result)   Collection Time: 11/13/23 11:46 AM    Specimen: BLOOD  Result Value Ref Range Status   Specimen Description BLOOD BLOOD RIGHT WRIST  Final   Special Requests   Final    BOTTLES DRAWN AEROBIC AND ANAEROBIC Blood Culture results may not be optimal due to an inadequate volume of blood received in culture bottles   Culture   Final    NO GROWTH 3 DAYS Performed at [facility], [address] [place], [place] [address]    Report Status PENDING  Incomplete  Urine Culture     Status: Abnormal   Collection Time: 11/13/23 11:46 AM   Specimen: Urine, Random  Result Value Ref Range Status   Specimen Description   Final    URINE, RANDOM Performed at [facility], [address], [place], [place] [address]    Special Requests   Final    NONE Reflexed from [id number] Performed at [facility], [address] [place], [place] [address]    Culture (A)  Final    <10,000 COLONIES/mL INSIGNIFICANT GROWTH Performed at [facility], [address] [place], [place] [address]    Report Status 11/14/2023 FINAL  Final  MRSA Next Gen by PCR, Nasal     Status: None   Collection Time: 11/13/23  8:44 PM   Specimen: Nasal Mucosa; Nasal Swab  Result Value Ref Range Status   MRSA by PCR Next Gen NOT DETECTED NOT DETECTED Final    Comment: (NOTE) The GeneXpert MRSA Assay (FDA approved for NASAL specimens only), is one component of a comprehensive MRSA colonization surveillance program. It is not intended to diagnose MRSA infection nor to guide or monitor treatment for MRSA infections. Test performance is not FDA approved in patients less than 70 years old. Performed at [facility], [address] [place], [place] [address]   Respiratory (~20 pathogens) panel by PCR     Status: None   Collection Time: 11/14/23 12:15 PM   Specimen: Nasopharyngeal Swab; Respiratory  Result Value Ref Range Status   Adenovirus NOT DETECTED NOT DETECTED Final   Coronavirus 229E NOT DETECTED NOT DETECTED Final     Comment: (NOTE) The Coronavirus on the Respiratory Panel, DOES NOT test for the novel  Coronavirus (2019 nCoV)    Coronavirus HKU1 NOT DETECTED NOT DETECTED Final   Coronavirus NL63 NOT DETECTED NOT DETECTED Final   Coronavirus OC43 NOT DETECTED NOT DETECTED Final   Metapneumovirus NOT DETECTED NOT DETECTED Final   Rhinovirus / Enterovirus NOT DETECTED NOT DETECTED Final   Influenza A NOT DETECTED NOT DETECTED Final   Influenza B NOT DETECTED NOT DETECTED Final   Parainfluenza Virus 1 NOT DETECTED NOT DETECTED Final   Parainfluenza Virus 2 NOT DETECTED NOT DETECTED Final   Parainfluenza Virus 3 NOT DETECTED NOT DETECTED Final   Parainfluenza Virus 4 NOT DETECTED NOT DETECTED  Final   Respiratory Syncytial Virus NOT DETECTED NOT DETECTED Final   Bordetella pertussis NOT DETECTED NOT DETECTED Final   Bordetella Parapertussis NOT DETECTED NOT DETECTED Final   Chlamydophila pneumoniae NOT DETECTED NOT DETECTED Final   Mycoplasma pneumoniae NOT DETECTED NOT DETECTED Final    Comment: Performed at [facility], [address] [place], [place] [address]     Labs: BNP (last 3 results) Recent Labs    05/26/23 1258  BNP 82.6   Basic Metabolic Panel: Recent Labs  Lab 11/13/23 1145 11/14/23 0558 11/15/23 0336 11/16/23 0336  NA 146* 142 144 145  K 3.4* 3.8 3.9 3.7  CL 112* 109 110 112*  CO2 25 27 23 23   GLUCOSE 108* 125* 135* 169*  BUN 32* 32* 36* 39*  CREATININE 2.25* 2.29* 2.25* 2.35*  CALCIUM  7.6* 7.5* 7.6* 7.9*  MG 1.4*  --  2.1  --   PHOS 2.4*  --   --   --    Liver Function Tests: Recent Labs  Lab 11/13/23 1145 11/14/23 0558  AST 16 66*  ALT 8 60*  ALKPHOS 28* 51  BILITOT 0.5 1.1  PROT 5.0* 5.0*  ALBUMIN  2.8* 2.5*   No results for input(s): LIPASE, AMYLASE in the last 168 hours. No results for input(s): AMMONIA in the last 168 hours. CBC: Recent Labs  Lab 11/13/23 1145 11/14/23 0004 11/14/23 0558 11/15/23 0336 11/16/23 0336  WBC 16.9*  --   19.8* 18.8* 12.9*  NEUTROABS 14.2*  --  16.8*  --   --   HGB 4.8* 11.8* 10.5* 12.0 10.7*  HCT 14.8* 34.1* 30.8* 35.3* 32.1*  MCV 94.9  --  86.8 86.3 88.7  PLT 158  --  110* 120* 118*   Cardiac Enzymes: No results for input(s): CKTOTAL, CKMB, CKMBINDEX, TROPONINI in the last 168 hours. BNP: Invalid input(s): POCBNP CBG: Recent Labs  Lab 11/15/23 1144 11/15/23 1530 11/15/23 2035 11/16/23 0739 11/16/23 1125  GLUCAP 251* 223* 299* 134* 203*   D-Dimer No results for input(s): DDIMER in the last 72 hours. Hgb A1c No results for input(s): HGBA1C in the last 72 hours. Lipid Profile No results for input(s): CHOL, HDL, LDLCALC, TRIG, CHOLHDL, LDLDIRECT in the last 72 hours. Thyroid  function studies No results for input(s): TSH, T4TOTAL, T3FREE, THYROIDAB in the last 72 hours.  Invalid input(s): FREET3 Anemia work up No results for input(s): VITAMINB12, FOLATE, FERRITIN, TIBC, IRON , RETICCTPCT in the last 72 hours. Urinalysis    Component Value Date/Time   COLORURINE STRAW (A) 11/13/2023 1146   APPEARANCEUR CLEAR (A) 11/13/2023 1146   APPEARANCEUR Clear 09/18/2020 1153   LABSPEC 1.011 11/13/2023 1146   LABSPEC 1.026 03/01/2013 1844   PHURINE 7.0 11/13/2023 1146   GLUCOSEU NEGATIVE 11/13/2023 1146   GLUCOSEU >=500 03/01/2013 1844   HGBUR SMALL (A) 11/13/2023 1146   BILIRUBINUR NEGATIVE 11/13/2023 1146   BILIRUBINUR neg 10/23/2020 0953   BILIRUBINUR Negative 09/18/2020 1153   BILIRUBINUR Negative 03/01/2013 1844   KETONESUR NEGATIVE 11/13/2023 1146   PROTEINUR >=300 (A) 11/13/2023 1146   UROBILINOGEN 0.2 10/23/2020 0953   NITRITE NEGATIVE 11/13/2023 1146   LEUKOCYTESUR TRACE (A) 11/13/2023 1146   LEUKOCYTESUR Negative 03/01/2013 1844   Sepsis Labs Recent Labs  Lab 11/13/23 1145 11/14/23 0558 11/15/23 0336 11/16/23 0336  WBC 16.9* 19.8* 18.8* 12.9*   Microbiology Recent Results (from the past 240 hours)  Resp panel  by RT-PCR (RSV, Flu A&B, Covid) Anterior Nasal Swab     Status: None  Collection Time: 11/13/23 11:45 AM   Specimen: Anterior Nasal Swab  Result Value Ref Range Status   SARS Coronavirus 2 by RT PCR NEGATIVE NEGATIVE Final    Comment: (NOTE) SARS-CoV-2 target nucleic acids are NOT DETECTED.  The SARS-CoV-2 RNA is generally detectable in upper respiratory specimens during the acute phase of infection. The lowest concentration of SARS-CoV-2 viral copies this assay can detect is 138 copies/mL. A negative result does not preclude SARS-Cov-2 infection and should not be used as the sole basis for treatment or other patient management decisions. A negative result may occur with  improper specimen collection/handling, submission of specimen other than nasopharyngeal swab, presence of viral mutation(s) within the areas targeted by this assay, and inadequate number of viral copies(<138 copies/mL). A negative result must be combined with clinical observations, patient history, and epidemiological information. The expected result is Negative.  Fact Sheet for Patients:  BloggerCourse.com  Fact Sheet for Healthcare Providers:  SeriousBroker.it  This test is no t yet approved or cleared by the [place]  FDA and  has been authorized for detection and/or diagnosis of SARS-CoV-2 by FDA under an Emergency Use Authorization (EUA). This EUA will remain  in effect (meaning this test can be used) for the duration of the COVID-19 declaration under Section 564(b)(1) of the Act, 21 U.S.C.section 360bbb-3(b)(1), unless the authorization is terminated  or revoked sooner.       Influenza A by PCR NEGATIVE NEGATIVE Final   Influenza B by PCR NEGATIVE NEGATIVE Final    Comment: (NOTE) The Xpert Xpress SARS-CoV-2/FLU/RSV plus assay is intended as an aid in the diagnosis of influenza from Nasopharyngeal swab specimens and should not be used as a sole  basis for treatment. Nasal washings and aspirates are unacceptable for Xpert Xpress SARS-CoV-2/FLU/RSV testing.  Fact Sheet for Patients: BloggerCourse.com  Fact Sheet for Healthcare Providers: SeriousBroker.it  This test is not yet approved or cleared by the [place]  FDA and has been authorized for detection and/or diagnosis of SARS-CoV-2 by FDA under an Emergency Use Authorization (EUA). This EUA will remain in effect (meaning this test can be used) for the duration of the COVID-19 declaration under Section 564(b)(1) of the Act, 21 U.S.C. section 360bbb-3(b)(1), unless the authorization is terminated or revoked.     Resp Syncytial Virus by PCR NEGATIVE NEGATIVE Final    Comment: (NOTE) Fact Sheet for Patients: BloggerCourse.com  Fact Sheet for Healthcare Providers: SeriousBroker.it  This test is not yet approved or cleared by the [place]  FDA and has been authorized for detection and/or diagnosis of SARS-CoV-2 by FDA under an Emergency Use Authorization (EUA). This EUA will remain in effect (meaning this test can be used) for the duration of the COVID-19 declaration under Section 564(b)(1) of the Act, 21 U.S.C. section 360bbb-3(b)(1), unless the authorization is terminated or revoked.  Performed at [facility], [address] [place], [place] [address]   Blood Culture (routine x 2)     Status: None (Preliminary result)   Collection Time: 11/13/23 11:46 AM   Specimen: BLOOD  Result Value Ref Range Status   Specimen Description BLOOD BLOOD LEFT WRIST  Final   Special Requests   Final    BOTTLES DRAWN AEROBIC ONLY Blood Culture results may not be optimal due to an inadequate volume of blood received in culture bottles   Culture   Final    NO GROWTH 3 DAYS Performed at [facility], [address] [place], [place] [address]    Report  Status  PENDING  Incomplete  Blood Culture (routine x 2)     Status: None (Preliminary result)   Collection Time: 11/13/23 11:46 AM   Specimen: BLOOD  Result Value Ref Range Status   Specimen Description BLOOD BLOOD RIGHT WRIST  Final   Special Requests   Final    BOTTLES DRAWN AEROBIC AND ANAEROBIC Blood Culture results may not be optimal due to an inadequate volume of blood received in culture bottles   Culture   Final    NO GROWTH 3 DAYS Performed at [facility], [address] [place], [place] [address]    Report Status PENDING  Incomplete  Urine Culture     Status: Abnormal   Collection Time: 11/13/23 11:46 AM   Specimen: Urine, Random  Result Value Ref Range Status   Specimen Description   Final    URINE, RANDOM Performed at [facility], [address] [place], [place] [address]    Special Requests   Final    NONE Reflexed from [id number] Performed at [facility], [address] [place], [place] [address]    Culture (A)  Final    <10,000 COLONIES/mL INSIGNIFICANT GROWTH Performed at [facility], [address], [place], [place] [address]    Report Status 11/14/2023 FINAL  Final  MRSA Next Gen by PCR, Nasal     Status: None   Collection Time: 11/13/23  8:44 PM   Specimen: Nasal Mucosa; Nasal Swab  Result Value Ref Range Status   MRSA by PCR Next Gen NOT DETECTED NOT DETECTED Final    Comment: (NOTE) The GeneXpert MRSA Assay (FDA approved for NASAL specimens only), is one component of a comprehensive MRSA colonization surveillance program. It is not intended to diagnose MRSA infection nor to guide or monitor treatment for MRSA infections. Test performance is not FDA approved in patients less than 26 years old. Performed at [facility], [address] [place], [place] [address]   Respiratory (~20 pathogens) panel by PCR     Status: None   Collection Time: 11/14/23 12:15 PM   Specimen: Nasopharyngeal Swab; Respiratory  Result  Value Ref Range Status   Adenovirus NOT DETECTED NOT DETECTED Final   Coronavirus 229E NOT DETECTED NOT DETECTED Final    Comment: (NOTE) The Coronavirus on the Respiratory Panel, DOES NOT test for the novel  Coronavirus (2019 nCoV)    Coronavirus HKU1 NOT DETECTED NOT DETECTED Final   Coronavirus NL63 NOT DETECTED NOT DETECTED Final   Coronavirus OC43 NOT DETECTED NOT DETECTED Final   Metapneumovirus NOT DETECTED NOT DETECTED Final   Rhinovirus / Enterovirus NOT DETECTED NOT DETECTED Final   Influenza A NOT DETECTED NOT DETECTED Final   Influenza B NOT DETECTED NOT DETECTED Final   Parainfluenza Virus 1 NOT DETECTED NOT DETECTED Final   Parainfluenza Virus 2 NOT DETECTED NOT DETECTED Final   Parainfluenza Virus 3 NOT DETECTED NOT DETECTED Final   Parainfluenza Virus 4 NOT DETECTED NOT DETECTED Final   Respiratory Syncytial Virus NOT DETECTED NOT DETECTED Final   Bordetella pertussis NOT DETECTED NOT DETECTED Final   Bordetella Parapertussis NOT DETECTED NOT DETECTED Final   Chlamydophila pneumoniae NOT DETECTED NOT DETECTED Final   Mycoplasma pneumoniae NOT DETECTED NOT DETECTED Final    Comment: Performed at [facility], [address] [place], [place] [address]     Time coordinating discharge: 34 mins   SIGNED:   Anthony CHRISTELLA Pouch, MD  [facility] 11/16/2023,  1:35 PM Pager   If 7PM-7AM, please contact night-coverage [email or web address]

## 2023-11-16 NOTE — Plan of Care (Signed)
  Problem: Fluid Volume: Goal: Hemodynamic stability will improve Outcome: Adequate for Discharge   Problem: Clinical Measurements: Goal: Diagnostic test results will improve Outcome: Adequate for Discharge Goal: Signs and symptoms of infection will decrease Outcome: Adequate for Discharge   Problem: Respiratory: Goal: Ability to maintain adequate ventilation will improve Outcome: Adequate for Discharge   Problem: Education: Goal: Knowledge of General Education information will improve Description: Including pain rating scale, medication(s)/side effects and non-pharmacologic comfort measures Outcome: Adequate for Discharge   Problem: Health Behavior/Discharge Planning: Goal: Ability to manage health-related needs will improve Outcome: Adequate for Discharge   Problem: Clinical Measurements: Goal: Ability to maintain clinical measurements within normal limits will improve Outcome: Adequate for Discharge Goal: Will remain free from infection Outcome: Adequate for Discharge Goal: Diagnostic test results will improve Outcome: Adequate for Discharge Goal: Respiratory complications will improve Outcome: Adequate for Discharge Goal: Cardiovascular complication will be avoided Outcome: Adequate for Discharge   Problem: Activity: Goal: Risk for activity intolerance will decrease Outcome: Adequate for Discharge   Problem: Nutrition: Goal: Adequate nutrition will be maintained Outcome: Adequate for Discharge   Problem: Coping: Goal: Level of anxiety will decrease Outcome: Adequate for Discharge   Problem: Elimination: Goal: Will not experience complications related to bowel motility Outcome: Adequate for Discharge Goal: Will not experience complications related to urinary retention Outcome: Adequate for Discharge   Problem: Pain Managment: Goal: General experience of comfort will improve and/or be controlled Outcome: Adequate for Discharge   Problem: Safety: Goal:  Ability to remain free from injury will improve Outcome: Adequate for Discharge   Problem: Skin Integrity: Goal: Risk for impaired skin integrity will decrease Outcome: Adequate for Discharge   Problem: Education: Goal: Ability to describe self-care measures that may prevent or decrease complications (Diabetes Survival Skills Education) will improve Outcome: Adequate for Discharge Goal: Individualized Educational Video(s) Outcome: Adequate for Discharge   Problem: Coping: Goal: Ability to adjust to condition or change in health will improve Outcome: Adequate for Discharge   Problem: Fluid Volume: Goal: Ability to maintain a balanced intake and output will improve Outcome: Adequate for Discharge   Problem: Health Behavior/Discharge Planning: Goal: Ability to identify and utilize available resources and services will improve Outcome: Adequate for Discharge Goal: Ability to manage health-related needs will improve Outcome: Adequate for Discharge   Problem: Metabolic: Goal: Ability to maintain appropriate glucose levels will improve Outcome: Adequate for Discharge   Problem: Nutritional: Goal: Maintenance of adequate nutrition will improve Outcome: Adequate for Discharge Goal: Progress toward achieving an optimal weight will improve Outcome: Adequate for Discharge   Problem: Skin Integrity: Goal: Risk for impaired skin integrity will decrease Outcome: Adequate for Discharge   Problem: Tissue Perfusion: Goal: Adequacy of tissue perfusion will improve Outcome: Adequate for Discharge

## 2023-11-16 NOTE — TOC Transition Note (Signed)
 Transition of Care [facility]) - Discharge Note   Patient Details  Name: Ashley Camacho MRN: [id number] Date of Birth: 1991-01-20  Transition of Care [facility]) CM/SW Contact:  Lauraine [name], LCSW Phone Number: 11/16/2023, 1:50 PM   Clinical Narrative:  Patient has orders to discharge home today. No further concerns. CSW signing off.   Final next level of care: Home/Self Care Barriers to Discharge: Barriers Resolved   Patient Goals and CMS Choice            Discharge Placement                Patient to be transferred to facility by: Roommate or mom   Patient and family notified of of transfer: 11/16/23  Discharge Plan and Services Additional resources added to the After Visit Summary for       Post Acute Care Choice: NA                               Social Drivers of Health (SDOH) Interventions SDOH Screenings   Food Insecurity: No Food Insecurity (11/13/2023)  Recent Concern: Food Insecurity - Food Insecurity Present (10/31/2023)  Housing: Low Risk  (11/13/2023)  Transportation Needs: No Transportation Needs (11/13/2023)  Recent Concern: Transportation Needs - Unmet Transportation Needs (10/31/2023)  Utilities: Not At Risk (11/13/2023)  Depression (PHQ2-9): Medium Risk (10/21/2023)  Financial Resource Strain: Low Risk  (08/04/2023)   Received from [facility]  Recent Concern: Financial Resource Strain - High Risk (05/09/2023)  Physical Activity: Unknown (05/09/2023)  Social Connections: Socially Isolated (10/31/2023)  Stress: Stress Concern Present (05/09/2023)  Tobacco Use: Low Risk  (10/31/2023)     Readmission Risk Interventions    11/15/2023    4:19 PM 11/04/2023   10:48 AM 04/07/2023   11:07 AM  Readmission Risk Prevention Plan  Transportation Screening Complete Complete Complete  Medication Review Oceanographer) Complete Complete Complete  PCP or Specialist appointment within 3-5 days of discharge Complete Complete Complete  HRI or  Home Care Consult  Not Complete   HRI or Home Care Consult Pt Refusal Comments  n/a   SW Recovery Care/Counseling Consult Complete Complete Complete  Palliative Care Screening Not Applicable Not Applicable Not Applicable  Skilled Nursing Facility Not Applicable Not Applicable Not Applicable

## 2023-11-17 ENCOUNTER — Ambulatory Visit: Payer: Self-pay | Admitting: Physician Assistant

## 2023-11-17 ENCOUNTER — Telehealth: Payer: Self-pay

## 2023-11-17 NOTE — Progress Notes (Signed)
 No growth for blood culture. Her blood sugars elevated. Advised to follow-up with endocrinology! As she prefers to follow up with endocrinology with DM management

## 2023-11-17 NOTE — Transitions of Care (Post Inpatient/ED Visit) (Signed)
   11/17/2023  Name: Ashley Camacho MRN: [id number] DOB: 1990/12/14  Today's TOC FU Call Status: Today's TOC FU Call Status:: Unsuccessful Call (1st Attempt) Unsuccessful Call (1st Attempt) Date: 11/17/23  Attempted to reach the patient regarding the most recent Inpatient/ED visit.  Follow Up Plan: Additional outreach attempts will be made to reach the patient to complete the Transitions of Care (Post Inpatient/ED visit) call.   Richerd Fish, RN, BSN, CCM [facility], [facility] RN Care Manager Direct Dial: [phone]

## 2023-11-18 ENCOUNTER — Telehealth: Payer: Self-pay

## 2023-11-18 LAB — CULTURE, BLOOD (ROUTINE X 2)
Culture: NO GROWTH
Culture: NO GROWTH

## 2023-11-18 NOTE — Transitions of Care (Post Inpatient/ED Visit) (Signed)
   11/18/2023  Name: Ashley Camacho MRN: [id number] DOB: 1991-04-09  Today's TOC FU Call Status: Today's TOC FU Call Status:: Unsuccessful Call (2nd Attempt) Unsuccessful Call (2nd Attempt) Date: 11/18/23  Attempted to reach the patient regarding the most recent Inpatient/ED visit. Left voicemail message requesting a return call.  Follow Up Plan: Additional outreach attempts will be made to reach the patient to complete the Transitions of Care (Post Inpatient/ED visit) call.   Richerd Fish, RN, BSN, CCM [facility], [facility] RN Care Manager Direct Dial: [phone]

## 2023-11-21 ENCOUNTER — Telehealth: Payer: Self-pay

## 2023-11-21 NOTE — Transitions of Care (Post Inpatient/ED Visit) (Signed)
 11/21/2023  Name: Ashley Camacho MRN: [id number] DOB: 07/04/1990  Today's TOC FU Call Status: Today's TOC FU Call Status:: Successful TOC FU Call Completed TOC FU Call Complete Date: 11/21/23 Patient's Name and Date of Birth confirmed.  Transition Care Management Follow-up Telephone Call Date of Discharge: 11/16/23 Discharge Facility: [facility]) Type of Discharge: Inpatient Admission Primary Inpatient Discharge Diagnosis:: UUTI Diabetes How have you been since you were released from the hospital?: Better (states I'm ok no problems) Any questions or concerns?: No  Items Reviewed: Did you receive and understand the discharge instructions provided?: Yes Medications obtained,verified, and reconciled?: Yes (Medications Reviewed) (States she did not get the proboitic will talk with PA about it) Any new allergies since your discharge?: No Dietary orders reviewed?: NA Do you have support at home?: Yes Name of Support/Comfort Primary Source: Did not disclose  Medications Reviewed Today: Medications Reviewed Today     Reviewed by Eilleen Richerd GRADE, RN (Registered Nurse) on 11/21/23 at 1231  Med List Status: <None>   Medication Order Taking? Sig Documenting Provider Last Dose Status Informant  acetaminophen  (TYLENOL ) 500 MG tablet [id number]  Take 2 tablets (1,000 mg total) by mouth every 6 (six) hours as needed for headache, fever or moderate pain. Dorinda Drue DASEN, MD  Active Self, Pharmacy Records           Med Note [facility], MARIA   Sun Nov 13, 2023  3:20 PM) prn  BAQSIMI ONE PACK 3 MG/DOSE POWD [id number]  Place 3 mg into the nose as needed. [provider]  Active Self, Pharmacy Records           Med Note [facility], ELIZABETH A   Mon Oct 31, 2023  9:04 AM) PRN  BD PEN NEEDLE MINI ULTRAFINE 31G X 5 MM MISC [id number]  by Other route. [provider]  Active Self, Pharmacy Records  Blood Pressure Monitoring (BLOOD PRESSURE CUFF) MISC  [id number]  Use as directed  Patient not taking: Reported on 11/21/2023   Loistine Sober, NP  Active Self, Pharmacy Records  cholecalciferol  (VITAMIN D3) 25 MCG (1000 UNIT) tablet [id number]   [provider]  Active Self, Pharmacy Records  Continuous Glucose Sensor (DEXCOM G7 SENSOR) MISC [id number]  SMARTSIG:1 Each Every 10 Days [provider]  Active Self, Pharmacy Records  divalproex  (DEPAKOTE ) 250 MG DR tablet [id number]  Take 250 mg by mouth daily. [provider]  Active Self, Pharmacy Records  hydrocortisone  (CORTEF ) 10 MG tablet [id number]  Take 2 tablets (20 mg total) by mouth daily with breakfast AND 1 tablet (10 mg total) daily with supper. [name], MD  Active Self, Pharmacy Records  insulin  aspart (NOVOLOG ) 100 UNIT/ML FlexPen [id number]  Inject 2 Units into the skin 3 (three) times daily with meals. If eating and Blood Glucose (BG) 80 or higher inject 2 units for meal coverage and add correction dose per scale. If not eating, correction dose only. BG <150= 0 unit; BG 150-200= 1 unit; BG 201-250= 3 unit; BG 251-300= 5 unit; BG 301-350= 7 unit; BG 351-400= 9 unit; BG >400= 11 unit and Call Primary Care. [name], MD  Active Self, Pharmacy Records           Med Note TRENA, [facility] D   Mon Nov 14, 2023 10:26 AM)    insulin  glargine (LANTUS ) 100 UNIT/ML Solostar Pen [id number]  Inject 10 Units into the skin daily. [name], MD  Active Self, Pharmacy Records  Med Note TRENA, RODNEY D   Mon Nov 14, 2023 10:27 AM)    Insulin  Pen Needle (PEN NEEDLES) 31G X 6 MM MISC [id number]  PEN NEEDLES 31G X 6 MM [provider]  Active Self, Pharmacy Records  levothyroxine  (SYNTHROID ) 75 MCG tablet [id number]  TAKE 1 TABLET (75 MCG TOTAL) BY MOUTH DAILY AT 6 (SIX) AM. Dineen Channel, PA-C  Active Self, Pharmacy Records  metoprolol  tartrate (LOPRESSOR ) 25 MG tablet [id number]  Take 1 tablet (25 mg total) by mouth 2 (two) times daily.  Trudy Anthony HERO, MD  Active   Pancrelipase , Lip-Prot-Amyl, 24000-76000 units CPEP [id number]  Take 1 capsule (24,000 Units total) by mouth 3 (three) times daily before meals. [name], MD  Active Self, Pharmacy Records  saccharomyces boulardii [facility]) 250 MG capsule [id number]  Take 250 mg by mouth 2 (two) times daily.  Patient not taking: Reported on 11/21/2023   [provider]  Active Self, Pharmacy Records  Med List Note Joyice, Ileana FALCON, [place] 11/22/22 9584): Pt not consistent with medications. Recent admission             Home Care and Equipment/Supplies: Were Home Health Services Ordered?: No  Follow up appointments reviewed: 11/28/23 with Nephrology     SDOH Interventions Today    Flowsheet Row Most Recent Value  SDOH Interventions   Food Insecurity Interventions Intervention Not Indicated  Housing Interventions Intervention Not Indicated  Transportation Interventions Intervention Not Indicated  Utilities Interventions Intervention Not Indicated    Richerd Fish, RN, BSN, CCM [place]  [facility], [facility] RN Care Manager Direct Dial: [phone]

## 2023-11-22 ENCOUNTER — Observation Stay
Admission: EM | Admit: 2023-11-22 | Discharge: 2023-11-23 | Disposition: A | Discharge status: Home / Self Care | Attending: Internal Medicine | Admitting: Internal Medicine

## 2023-11-22 ENCOUNTER — Encounter: Payer: Self-pay | Admitting: Emergency Medicine

## 2023-11-22 ENCOUNTER — Other Ambulatory Visit: Payer: Self-pay

## 2023-11-22 ENCOUNTER — Emergency Department

## 2023-11-22 DIAGNOSIS — D649 Anemia, unspecified: Secondary | ICD-10-CM | POA: Insufficient documentation

## 2023-11-22 DIAGNOSIS — R0602 Shortness of breath: Secondary | ICD-10-CM | POA: Diagnosis not present

## 2023-11-22 DIAGNOSIS — I10 Essential (primary) hypertension: Secondary | ICD-10-CM | POA: Diagnosis not present

## 2023-11-22 DIAGNOSIS — E271 Primary adrenocortical insufficiency: Secondary | ICD-10-CM | POA: Diagnosis not present

## 2023-11-22 DIAGNOSIS — R945 Abnormal results of liver function studies: Secondary | ICD-10-CM | POA: Insufficient documentation

## 2023-11-22 DIAGNOSIS — I5032 Chronic diastolic (congestive) heart failure: Secondary | ICD-10-CM | POA: Diagnosis not present

## 2023-11-22 DIAGNOSIS — N1832 Chronic kidney disease, stage 3b: Secondary | ICD-10-CM | POA: Insufficient documentation

## 2023-11-22 DIAGNOSIS — R7989 Other specified abnormal findings of blood chemistry: Secondary | ICD-10-CM | POA: Diagnosis not present

## 2023-11-22 DIAGNOSIS — J189 Pneumonia, unspecified organism: Secondary | ICD-10-CM | POA: Insufficient documentation

## 2023-11-22 DIAGNOSIS — E109 Type 1 diabetes mellitus without complications: Secondary | ICD-10-CM | POA: Diagnosis present

## 2023-11-22 DIAGNOSIS — R0989 Other specified symptoms and signs involving the circulatory and respiratory systems: Secondary | ICD-10-CM | POA: Diagnosis not present

## 2023-11-22 DIAGNOSIS — R0600 Dyspnea, unspecified: Principal | ICD-10-CM | POA: Insufficient documentation

## 2023-11-22 DIAGNOSIS — R531 Weakness: Secondary | ICD-10-CM | POA: Insufficient documentation

## 2023-11-22 DIAGNOSIS — E039 Hypothyroidism, unspecified: Secondary | ICD-10-CM | POA: Diagnosis not present

## 2023-11-22 DIAGNOSIS — E1022 Type 1 diabetes mellitus with diabetic chronic kidney disease: Secondary | ICD-10-CM | POA: Diagnosis not present

## 2023-11-22 DIAGNOSIS — I130 Hypertensive heart and chronic kidney disease with heart failure and stage 1 through stage 4 chronic kidney disease, or unspecified chronic kidney disease: Secondary | ICD-10-CM | POA: Diagnosis not present

## 2023-11-22 LAB — CBC WITH DIFFERENTIAL/PLATELET
Abs Immature Granulocytes: 0.02 10*3/uL (ref 0.00–0.07)
Basophils Absolute: 0 10*3/uL (ref 0.0–0.1)
Basophils Relative: 0 %
Eosinophils Absolute: 0 10*3/uL (ref 0.0–0.5)
Eosinophils Relative: 0 %
HCT: 30 % — ABNORMAL LOW (ref 36.0–46.0)
Hemoglobin: 9.5 g/dL — ABNORMAL LOW (ref 12.0–15.0)
Immature Granulocytes: 0 %
Lymphocytes Relative: 8 %
Lymphs Abs: 0.6 10*3/uL — ABNORMAL LOW (ref 0.7–4.0)
MCH: 29.7 pg (ref 26.0–34.0)
MCHC: 31.7 g/dL (ref 30.0–36.0)
MCV: 93.8 fL (ref 80.0–100.0)
Monocytes Absolute: 0.4 10*3/uL (ref 0.1–1.0)
Monocytes Relative: 5 %
Neutro Abs: 5.7 10*3/uL (ref 1.7–7.7)
Neutrophils Relative %: 87 %
Platelets: 173 10*3/uL (ref 150–400)
RBC: 3.2 MIL/uL — ABNORMAL LOW (ref 3.87–5.11)
RDW: 15.7 % — ABNORMAL HIGH (ref 11.5–15.5)
WBC: 6.7 10*3/uL (ref 4.0–10.5)
nRBC: 0 % (ref 0.0–0.2)

## 2023-11-22 LAB — COMPREHENSIVE METABOLIC PANEL WITH GFR
ALT: 52 U/L — ABNORMAL HIGH (ref 0–44)
AST: 59 U/L — ABNORMAL HIGH (ref 15–41)
Albumin: 2.9 g/dL — ABNORMAL LOW (ref 3.5–5.0)
Alkaline Phosphatase: 104 U/L (ref 38–126)
Anion gap: 9 (ref 5–15)
BUN: 54 mg/dL — ABNORMAL HIGH (ref 6–20)
CO2: 23 mmol/L (ref 22–32)
Calcium: 7.6 mg/dL — ABNORMAL LOW (ref 8.9–10.3)
Chloride: 105 mmol/L (ref 98–111)
Creatinine, Ser: 2.1 mg/dL — ABNORMAL HIGH (ref 0.44–1.00)
GFR, Estimated: 32 mL/min — ABNORMAL LOW (ref >60)
Glucose, Bld: 74 mg/dL (ref 70–99)
Potassium: 3.9 mmol/L (ref 3.5–5.1)
Sodium: 137 mmol/L (ref 135–145)
Total Bilirubin: 0.6 mg/dL (ref 0.0–1.2)
Total Protein: 5.1 g/dL — ABNORMAL LOW (ref 6.5–8.1)

## 2023-11-22 LAB — GLUCOSE, CAPILLARY: Glucose-Capillary: 105 mg/dL — ABNORMAL HIGH (ref 70–99)

## 2023-11-22 LAB — LACTIC ACID, PLASMA: Lactic Acid, Venous: 1.3 mmol/L (ref 0.5–1.9)

## 2023-11-22 LAB — BRAIN NATRIURETIC PEPTIDE: B Natriuretic Peptide: >4500 pg/mL — ABNORMAL HIGH (ref 0.0–100.0)

## 2023-11-22 LAB — MAGNESIUM: Magnesium: 1.5 mg/dL — ABNORMAL LOW (ref 1.7–2.4)

## 2023-11-22 LAB — PROTIME-INR
INR: 1.3 — ABNORMAL HIGH (ref 0.8–1.2)
Prothrombin Time: 16.6 s — ABNORMAL HIGH (ref 11.4–15.2)

## 2023-11-22 LAB — PHOSPHORUS: Phosphorus: 3.3 mg/dL (ref 2.5–4.6)

## 2023-11-22 MED ORDER — DIVALPROEX SODIUM 250 MG PO DR TAB
250.0000 mg | DELAYED_RELEASE_TABLET | Freq: Every day | ORAL | Status: DC
  Administered 2023-11-23: 250 mg via ORAL
  Filled 2023-11-22: qty 1

## 2023-11-22 MED ORDER — HYDROCORTISONE SOD SUC (PF) 100 MG IJ SOLR
100.0000 mg | Freq: Two times a day (BID) | INTRAMUSCULAR | Status: DC
  Administered 2023-11-23: 100 mg via INTRAVENOUS
  Filled 2023-11-22: qty 2

## 2023-11-22 MED ORDER — INSULIN ASPART 100 UNIT/ML IJ SOLN: 0.0000 [IU] | Freq: Every day | INTRAMUSCULAR | Status: DC

## 2023-11-22 MED ORDER — SODIUM CHLORIDE 0.9 % IV BOLUS
1000.0000 mL | Freq: Once | INTRAVENOUS | Status: AC
  Administered 2023-11-22: 1000 mL via INTRAVENOUS

## 2023-11-22 MED ORDER — INSULIN GLARGINE-YFGN 100 UNIT/ML ~~LOC~~ SOLN
8.0000 [IU] | Freq: Every day | SUBCUTANEOUS | Status: DC
  Administered 2023-11-23: 8 [IU] via SUBCUTANEOUS
  Filled 2023-11-22: qty 0.08

## 2023-11-22 MED ORDER — DM-GUAIFENESIN ER 30-600 MG PO TB12: 1.0000 | ORAL_TABLET | Freq: Two times a day (BID) | ORAL | Status: DC | PRN

## 2023-11-22 MED ORDER — HYDRALAZINE HCL 20 MG/ML IJ SOLN: 5.0000 mg | INTRAMUSCULAR | Status: DC | PRN

## 2023-11-22 MED ORDER — LEVOTHYROXINE SODIUM 50 MCG PO TABS
75.0000 ug | ORAL_TABLET | Freq: Every day | ORAL | Status: DC
  Administered 2023-11-23: 75 ug via ORAL
  Filled 2023-11-22: qty 2

## 2023-11-22 MED ORDER — ALBUTEROL SULFATE (2.5 MG/3ML) 0.083% IN NEBU: 2.5000 mg | INHALATION_SOLUTION | RESPIRATORY_TRACT | Status: DC | PRN

## 2023-11-22 MED ORDER — HEPARIN SODIUM (PORCINE) 5000 UNIT/ML IJ SOLN
5000.0000 [IU] | Freq: Three times a day (TID) | INTRAMUSCULAR | Status: DC
  Filled 2023-11-22: qty 1

## 2023-11-22 MED ORDER — INSULIN ASPART 100 UNIT/ML IJ SOLN
0.0000 [IU] | Freq: Three times a day (TID) | INTRAMUSCULAR | Status: DC
  Administered 2023-11-23: 7 [IU] via SUBCUTANEOUS
  Administered 2023-11-23: 3 [IU] via SUBCUTANEOUS
  Filled 2023-11-22 (×2): qty 1

## 2023-11-22 MED ORDER — ONDANSETRON HCL 4 MG/2ML IJ SOLN: 4.0000 mg | Freq: Three times a day (TID) | INTRAMUSCULAR | Status: DC | PRN

## 2023-11-22 MED ORDER — METOPROLOL TARTRATE 25 MG PO TABS
25.0000 mg | ORAL_TABLET | Freq: Two times a day (BID) | ORAL | Status: DC
  Administered 2023-11-23: 25 mg via ORAL
  Filled 2023-11-22: qty 1

## 2023-11-22 MED ORDER — HYDROCORTISONE SOD SUC (PF) 100 MG IJ SOLR
100.0000 mg | Freq: Once | INTRAMUSCULAR | Status: AC
  Administered 2023-11-22: 100 mg via INTRAVENOUS
  Filled 2023-11-22: qty 2

## 2023-11-22 MED ORDER — ACETAMINOPHEN 325 MG PO TABS: 325.0000 mg | ORAL_TABLET | Freq: Four times a day (QID) | ORAL | Status: DC | PRN

## 2023-11-22 NOTE — H&P (Signed)
 History and Physical    Ashley Camacho FMW:[id number] DOB: 1990/09/09 DOA: 11/22/2023  Referring MD/NP/PA:   PCP: Ostwalt, [name], PA-C   Patient coming from:  The patient is coming from home.     Chief Complaint: Generalized weakness  HPI: Ashley Camacho is a 33 y.o. female with medical history significant of adrenal insufficiency, type 1 diabetes, HTN, dCHF, hypothyroidism, depression, CKD stage IIIb, anemia, DVT not on anticoagulants, gastroparesis, recent admission due to sepsis secondary to pneumonia, who presents with generalized weakness.  Patient was recently hospitalized from 6/22 - 6/25 due to sepsis secondary to pneumonia.  Patient was discharged on Levaquin  for 6 days.  She states that she completed course of Levaquin .  She continues to have mild cough with little yellow-colored sputum production and mild shortness of breath.  She said her shortness of breath and cough have not been worse.  No chest pain, denies fever or chills to me.  She states that her major issue is fatigue and generalized weakness.  She states that she feels tired all the time without energy.  No unilateral numbness or tingling in extremities.  Denies nausea vomiting, diarrhea or abdominal pain.  No symptoms of UTI.  Denies rectal bleeding or dark stool.  Data reviewed independently and ED Course: pt was found to have WBC 6.7, stable renal function, INR 1.3, lactic acid 1.3, temperature 99.5, blood pressure 143/93, heart rate 118 --> 107, RR 20 --> 17, oxygen saturation 92-94% on room air.  Chest x-ray with low lung volume and mild bilateral multifocal infiltration.  Patient is placed in telemetry bed for observation.   EKG: I have personally reviewed.  Sinus rhythm, QTc 436, heart rate 116, LAD, low voltage.  Review of Systems:   General: no fevers, chills, no body weight gain, has fatigue and generalized weakness, lack of energy HEENT: no blurry vision, hearing changes or sore throat Respiratory: has  dyspnea, coughing, no wheezing CV: no chest pain, no palpitations GI: no nausea, vomiting, abdominal pain, diarrhea, constipation GU: no dysuria, burning on urination, increased urinary frequency, hematuria  Ext: has trace leg edema Neuro: no unilateral weakness, numbness, or tingling, no vision change or hearing loss Skin: no rash, no skin tear. MSK: No muscle spasm, no deformity, no limitation of range of movement in spin Heme: No easy bruising.  Travel history: No recent long distant travel.   Allergy: No Known Allergies  Past Medical History:  Diagnosis Date   Acute metabolic encephalopathy 06/10/2022   Adrenal insufficiency (HCC)    Anemia    CHF (congestive heart failure) (HCC)    Gastroparesis    Hypertension    Hypotension    Intractable nausea and vomiting 11/13/2022   Migraine    Thyroid  disease    Type 1 diabetes [facility])     Past Surgical History:  Procedure Laterality Date   BIOPSY  01/14/2021   Procedure: BIOPSY;  Surgeon: Eda Iha, MD;  Location: [facility] ENDOSCOPY;  Service: Gastroenterology;;   CENTRAL LINE INSERTION N/A 10/10/2023   Procedure: CENTRAL LINE INSERTION;  Surgeon: Marea Selinda RAMAN, MD;  Location: [facility] INVASIVE CV LAB;  Service: Cardiovascular;  Laterality: N/A;   CESAREAN SECTION     x2   CESAREAN SECTION WITH BILATERAL TUBAL LIGATION N/A 02/17/2021   Procedure: CESAREAN SECTION WITH BILATERAL TUBAL LIGATION;  Surgeon: Barbra Lang PARAS, DO;  Location: [facility] LD ORS;  Service: Obstetrics;  Laterality: N/A;   ESOPHAGOGASTRODUODENOSCOPY (EGD) WITH PROPOFOL  N/A 01/14/2021   Procedure: ESOPHAGOGASTRODUODENOSCOPY (EGD)  WITH PROPOFOL ;  Surgeon: Eda Iha, MD;  Location: [facility] ENDOSCOPY;  Service: Gastroenterology;  Laterality: N/A;   IR BONE MARROW BIOPSY & ASPIRATION  12/16/2022   IR REPLC DUODEN/JEJUNO TUBE PERCUT W/FLUORO  11/22/2022   JEJUNOSTOMY N/A 06/13/2022   Procedure: DEXTER;  Surgeon: Jordis Laneta FALCON, MD;  Location: [facility] ORS;  Service:  General;  Laterality: N/A;   MOUTH SURGERY      Social History:  reports that she has never smoked. She has never been exposed to tobacco smoke. She has never used smokeless tobacco. She reports that she does not drink alcohol and does not use drugs.  Family History:  Family History  Problem Relation Age of Onset   Breast cancer Mother    Seizures Mother    Diabetes type I Father    CAD Father    Lung cancer Maternal Grandfather    CAD Paternal Grandmother    CAD Paternal Grandfather    Ovarian cancer Neg Hx      Prior to Admission medications   Medication Sig Start Date End Date Taking? Authorizing Provider  acetaminophen  (TYLENOL ) 500 MG tablet Take 2 tablets (1,000 mg total) by mouth every 6 (six) hours as needed for headache, fever or moderate pain. 08/05/22   Dorinda Drue DASEN, MD  BAQSIMI ONE PACK 3 MG/DOSE POWD Place 3 mg into the nose as needed. 06/09/23   [provider]  BD PEN NEEDLE MINI ULTRAFINE 31G X 5 MM MISC by Other route. 07/18/23 07/17/24  [provider]  Blood Pressure Monitoring (BLOOD PRESSURE CUFF) MISC Use as directed Patient not taking: Reported on 11/21/2023 05/26/23   Loistine Sober, NP  cholecalciferol  (VITAMIN D3) 25 MCG (1000 UNIT) tablet  08/30/17   [provider]  Continuous Glucose Sensor (DEXCOM G7 SENSOR) MISC SMARTSIG:1 Each Every 10 Days    [provider]  divalproex  (DEPAKOTE ) 250 MG DR tablet Take 250 mg by mouth daily. 09/07/23 12/06/23  [provider]  hydrocortisone  (CORTEF ) 10 MG tablet Take 2 tablets (20 mg total) by mouth daily with breakfast AND 1 tablet (10 mg total) daily with supper. 05/27/23   [name], MD  insulin  aspart (NOVOLOG ) 100 UNIT/ML FlexPen Inject 2 Units into the skin 3 (three) times daily with meals. If eating and Blood Glucose (BG) 80 or higher inject 2 units for meal coverage and add correction dose per scale. If not eating, correction dose only. BG <150= 0 unit; BG  150-200= 1 unit; BG 201-250= 3 unit; BG 251-300= 5 unit; BG 301-350= 7 unit; BG 351-400= 9 unit; BG >400= 11 unit and Call Primary Care. 10/12/23   [name], MD  insulin  glargine (LANTUS ) 100 UNIT/ML Solostar Pen Inject 10 Units into the skin daily. 10/12/23   [name], MD  Insulin  Pen Needle (PEN NEEDLES) 31G X 6 MM MISC PEN NEEDLES 31G X 6 MM 05/31/17   [provider]  levothyroxine  (SYNTHROID ) 75 MCG tablet TAKE 1 TABLET (75 MCG TOTAL) BY MOUTH DAILY AT 6 (SIX) AM. 06/20/23   Ostwalt, [name], PA-C  Pancrelipase , Lip-Prot-Amyl, 24000-76000 units CPEP Take 1 capsule (24,000 Units total) by mouth 3 (three) times daily before meals. 11/04/23   [name], MD  metoprolol  tartrate (LOPRESSOR ) 25 MG tablet Take 1 tablet (25 mg total) by mouth 2 (two) times daily. 11/16/23 12/16/23  Trudy Anthony HERO, MD  saccharomyces boulardii (FLORASTOR) 250 MG capsule Take 250 mg by mouth 2 (two) times daily. Patient not taking: Reported on 11/21/2023  [provider]    Physical Exam: Vitals:   11/22/23 2130 11/22/23 2200 11/22/23 2230 11/22/23 2324  BP: (!) 143/93 (!) 151/92 (!) 148/88 128/84  Pulse: (!) 107 (!) 104 (!) 101 88  Resp: 17 (!) 27 20   Temp:    97.8 F (36.6 C)  TempSrc:    Oral  SpO2: 94% 92% 95% 97%  Weight:      Height:       General: Not in acute distress HEENT:       Eyes: PERRL, EOMI, no jaundice       ENT: No discharge from the ears and nose, no pharynx injection, no tonsillar enlargement.        Neck: No JVD, no bruit, no mass felt. Heme: No neck lymph node enlargement. Cardiac: S1/S2, RRR, No murmurs, No gallops or rubs. Respiratory: No rales, wheezing, rhonchi or rubs. GI: Soft, nondistended, nontender, no rebound pain, no organomegaly, BS present. GU: No hematuria Ext: has trace leg edema bilaterally. 1+DP/PT pulse bilaterally. Musculoskeletal: No joint deformities, No joint redness or warmth, no limitation of ROM in spin. Skin: No rashes.   Neuro: Alert, oriented X3, cranial nerves II-XII grossly intact, moves all extremities normally.  Psych: Patient is not psychotic, no suicidal or hemocidal ideation  Labs on Admission: I have personally reviewed following labs and imaging studies  CBC: Recent Labs  Lab 11/16/23 0336 11/22/23 2042  WBC 12.9* 6.7  NEUTROABS  --  5.7  HGB 10.7* 9.5*  HCT 32.1* 30.0*  MCV 88.7 93.8  PLT 118* 173   Basic Metabolic Panel: Recent Labs  Lab 11/16/23 0336 11/22/23 2042  NA 145 137  K 3.7 3.9  CL 112* 105  CO2 23 23  GLUCOSE 169* 74  BUN 39* 54*  CREATININE 2.35* 2.10*  CALCIUM  7.9* 7.6*  MG  --  1.5*  PHOS  --  3.3   GFR: Estimated Creatinine Clearance: 31.8 mL/min (A) (by C-G formula based on SCr of 2.1 mg/dL (H)). Liver Function Tests: Recent Labs  Lab 11/22/23 2042  AST 59*  ALT 52*  ALKPHOS 104  BILITOT 0.6  PROT 5.1*  ALBUMIN  2.9*   No results for input(s): LIPASE, AMYLASE in the last 168 hours. No results for input(s): AMMONIA in the last 168 hours. Coagulation Profile: Recent Labs  Lab 11/22/23 2042  INR 1.3*   Cardiac Enzymes: No results for input(s): CKTOTAL, CKMB, CKMBINDEX, TROPONINI in the last 168 hours. BNP (last 3 results) No results for input(s): PROBNP in the last 8760 hours. HbA1C: No results for input(s): HGBA1C in the last 72 hours. CBG: Recent Labs  Lab 11/16/23 0739 11/16/23 1125 11/22/23 2332  GLUCAP 134* 203* 105*   Lipid Profile: No results for input(s): CHOL, HDL, LDLCALC, TRIG, CHOLHDL, LDLDIRECT in the last 72 hours. Thyroid  Function Tests: Recent Labs    11/23/23 0033  TSH 1.778  FREET4 0.91   Anemia Panel: No results for input(s): VITAMINB12, FOLATE, FERRITIN, TIBC, IRON , RETICCTPCT in the last 72 hours. Urine analysis:    Component Value Date/Time   COLORURINE YELLOW (A) 11/23/2023 0052   APPEARANCEUR CLOUDY (A) 11/23/2023 0052   APPEARANCEUR Clear 09/18/2020 1153    LABSPEC 1.018 11/23/2023 0052   LABSPEC 1.026 03/01/2013 1844   PHURINE 5.0 11/23/2023 0052   GLUCOSEU NEGATIVE 11/23/2023 0052   GLUCOSEU >=500 03/01/2013 1844   HGBUR SMALL (A) 11/23/2023 0052   BILIRUBINUR NEGATIVE 11/23/2023 0052   BILIRUBINUR neg 10/23/2020 0953   BILIRUBINUR Negative  09/18/2020 1153   BILIRUBINUR Negative 03/01/2013 1844   KETONESUR NEGATIVE 11/23/2023 0052   PROTEINUR >=300 (A) 11/23/2023 0052   UROBILINOGEN 0.2 10/23/2020 0953   NITRITE NEGATIVE 11/23/2023 0052   LEUKOCYTESUR NEGATIVE 11/23/2023 0052   LEUKOCYTESUR Negative 03/01/2013 1844   Sepsis Labs: @LABRCNTIP (procalcitonin:4,lacticidven:4) ) Recent Results (from the past 240 hours)  Resp panel by RT-PCR (RSV, Flu A&B, Covid) Anterior Nasal Swab     Status: None   Collection Time: 11/13/23 11:45 AM   Specimen: Anterior Nasal Swab  Result Value Ref Range Status   SARS Coronavirus 2 by RT PCR NEGATIVE NEGATIVE Final    Comment: (NOTE) SARS-CoV-2 target nucleic acids are NOT DETECTED.  The SARS-CoV-2 RNA is generally detectable in upper respiratory specimens during the acute phase of infection. The lowest concentration of SARS-CoV-2 viral copies this assay can detect is 138 copies/mL. A negative result does not preclude SARS-Cov-2 infection and should not be used as the sole basis for treatment or other patient management decisions. A negative result may occur with  improper specimen collection/handling, submission of specimen other than nasopharyngeal swab, presence of viral mutation(s) within the areas targeted by this assay, and inadequate number of viral copies(<138 copies/mL). A negative result must be combined with clinical observations, patient history, and epidemiological information. The expected result is Negative.  Fact Sheet for Patients:  BloggerCourse.com  Fact Sheet for Healthcare Providers:  SeriousBroker.it  This test is no t  yet approved or cleared by the [place]  FDA and  has been authorized for detection and/or diagnosis of SARS-CoV-2 by FDA under an Emergency Use Authorization (EUA). This EUA will remain  in effect (meaning this test can be used) for the duration of the COVID-19 declaration under Section 564(b)(1) of the Act, 21 U.S.C.section 360bbb-3(b)(1), unless the authorization is terminated  or revoked sooner.       Influenza A by PCR NEGATIVE NEGATIVE Final   Influenza B by PCR NEGATIVE NEGATIVE Final    Comment: (NOTE) The Xpert Xpress SARS-CoV-2/FLU/RSV plus assay is intended as an aid in the diagnosis of influenza from Nasopharyngeal swab specimens and should not be used as a sole basis for treatment. Nasal washings and aspirates are unacceptable for Xpert Xpress SARS-CoV-2/FLU/RSV testing.  Fact Sheet for Patients: BloggerCourse.com  Fact Sheet for Healthcare Providers: SeriousBroker.it  This test is not yet approved or cleared by the [place]  FDA and has been authorized for detection and/or diagnosis of SARS-CoV-2 by FDA under an Emergency Use Authorization (EUA). This EUA will remain in effect (meaning this test can be used) for the duration of the COVID-19 declaration under Section 564(b)(1) of the Act, 21 U.S.C. section 360bbb-3(b)(1), unless the authorization is terminated or revoked.     Resp Syncytial Virus by PCR NEGATIVE NEGATIVE Final    Comment: (NOTE) Fact Sheet for Patients: BloggerCourse.com  Fact Sheet for Healthcare Providers: SeriousBroker.it  This test is not yet approved or cleared by the [place]  FDA and has been authorized for detection and/or diagnosis of SARS-CoV-2 by FDA under an Emergency Use Authorization (EUA). This EUA will remain in effect (meaning this test can be used) for the duration of the COVID-19 declaration under Section 564(b)(1)  of the Act, 21 U.S.C. section 360bbb-3(b)(1), unless the authorization is terminated or revoked.  Performed at [facility], [address] [place], [place] [address]   Blood Culture (routine x 2)     Status: None   Collection Time: 11/13/23 11:46 AM   Specimen: BLOOD  Result Value Ref Range Status   Specimen Description BLOOD BLOOD LEFT WRIST  Final   Special Requests   Final    BOTTLES DRAWN AEROBIC ONLY Blood Culture results may not be optimal due to an inadequate volume of blood received in culture bottles   Culture   Final    NO GROWTH 5 DAYS Performed at [facility], [address] [place], [place] [address]    Report Status 11/18/2023 FINAL  Final  Blood Culture (routine x 2)     Status: None   Collection Time: 11/13/23 11:46 AM   Specimen: BLOOD  Result Value Ref Range Status   Specimen Description BLOOD BLOOD RIGHT WRIST  Final   Special Requests   Final    BOTTLES DRAWN AEROBIC AND ANAEROBIC Blood Culture results may not be optimal due to an inadequate volume of blood received in culture bottles   Culture   Final    NO GROWTH 5 DAYS Performed at [facility], [address] [place], [place] [address]    Report Status 11/18/2023 FINAL  Final  Urine Culture     Status: Abnormal   Collection Time: 11/13/23 11:46 AM   Specimen: Urine, Random  Result Value Ref Range Status   Specimen Description   Final    URINE, RANDOM Performed at [facility], [address], [place], [place] [address]    Special Requests   Final    NONE Reflexed from [id number] Performed at [facility], [address] [place], [place] [address]    Culture (A)  Final    <10,000 COLONIES/mL INSIGNIFICANT GROWTH Performed at [facility], [address] [place], [place] [address]    Report Status 11/14/2023 FINAL  Final  MRSA Next Gen by PCR, Nasal     Status: None   Collection Time: 11/13/23  8:44 PM   Specimen: Nasal Mucosa; Nasal  Swab  Result Value Ref Range Status   MRSA by PCR Next Gen NOT DETECTED NOT DETECTED Final    Comment: (NOTE) The GeneXpert MRSA Assay (FDA approved for NASAL specimens only), is one component of a comprehensive MRSA colonization surveillance program. It is not intended to diagnose MRSA infection nor to guide or monitor treatment for MRSA infections. Test performance is not FDA approved in patients less than 66 years old. Performed at [facility], [address] [place], [place] [address]   Respiratory (~20 pathogens) panel by PCR     Status: None   Collection Time: 11/14/23 12:15 PM   Specimen: Nasopharyngeal Swab; Respiratory  Result Value Ref Range Status   Adenovirus NOT DETECTED NOT DETECTED Final   Coronavirus 229E NOT DETECTED NOT DETECTED Final    Comment: (NOTE) The Coronavirus on the Respiratory Panel, DOES NOT test for the novel  Coronavirus (2019 nCoV)    Coronavirus HKU1 NOT DETECTED NOT DETECTED Final   Coronavirus NL63 NOT DETECTED NOT DETECTED Final   Coronavirus OC43 NOT DETECTED NOT DETECTED Final   Metapneumovirus NOT DETECTED NOT DETECTED Final   Rhinovirus / Enterovirus NOT DETECTED NOT DETECTED Final   Influenza A NOT DETECTED NOT DETECTED Final   Influenza B NOT DETECTED NOT DETECTED Final   Parainfluenza Virus 1 NOT DETECTED NOT DETECTED Final   Parainfluenza Virus 2 NOT DETECTED NOT DETECTED Final   Parainfluenza Virus 3 NOT DETECTED NOT DETECTED Final   Parainfluenza Virus 4 NOT DETECTED NOT DETECTED Final   Respiratory Syncytial Virus NOT DETECTED NOT DETECTED Final  Bordetella pertussis NOT DETECTED NOT DETECTED Final   Bordetella Parapertussis NOT DETECTED NOT DETECTED Final   Chlamydophila pneumoniae NOT DETECTED NOT DETECTED Final   Mycoplasma pneumoniae NOT DETECTED NOT DETECTED Final    Comment: Performed at [facility], [address] [place], [place] [address]     Radiological Exams on  Admission:   Assessment/Plan Principal Problem:   Generalized weakness Active Problems:   Hypomagnesemia   Adrenal insufficiency (Addison's disease) (HCC)   HCAP (healthcare-associated pneumonia)   Chronic diastolic CHF (congestive heart failure) (HCC)   HTN (hypertension)   Type 1 diabetes mellitus (HCC)   CKD stage 3b, GFR 30-44 ml/min (HCC)   Normocytic anemia   Abnormal LFTs   Hypothyroidism   Assessment and Plan:  Generalized weakness: Patient states that her major issue is generalized weakness, lack of energy and fatigue.  Potential differential diagnosis is uncontrolled adrenal insufficiency.  Patient is taking Cortef  20-10 mg twice daily at home currently.  Due to recent sepsis from pneumonia, she may have increased need of steroid dose.  Also need to make sure her hypothyroid is controlled.  -Place in telemetry bed for observation - Start stress dose Solu-Cortef : 100 mg x 1 dose, then 15 mg every 6 hours -temporarily hold home Cortef  - check cortisol level (as add-on lab) - Check TSH and free T4 level - check Mg and phosphorus level --> Mg is 1.5 and phosphorus 3.3, will give 2 gram of magnesium  sulfate - Fall precaution  Adrenal insufficiency (Addison's disease) (HCC) -See above  Recent HCAP (healthcare-associated pneumonia): Patient does not have fever or leukocytosis today.  Symptoms have not been worsening.  She finished a course of Levaquin . - Supportive care - As needed albuterol  and Mucinex   Chronic diastolic CHF (congestive heart failure) (HCC): 2D echo on 11/02/2023 showed EF 55 to 60%.  Patient has trace leg edema, does not seem to have CHF exacerbation. -Check BMP  HTN (hypertension) -IV hydralazine  as needed - Metoprolol   Type 1 diabetes mellitus (HCC): Recent A1c 8.0, poorly controlled.  Patient taking NovoLog  and Lantus  10 unit in the morning - Sliding scale insulin  - 8 unit glargine insulin  daily  CKD stage 3b, GFR 30-44 ml/min (HCC): Renal  function at baseline.  Recent baseline creatinine 2.35 on 11/16/2023.  Her creatinine is at 2.10, BUN 54, GFR 32 - Follow-up with BMP  Normocytic anemia: Per discharge summary, in previous admission patient had Hb 4.8 on presentation, likely a lab error. Pt was s/p 2 units of pRBCs transfused. Her Hgb was 10.7 at discharge.  Today her hemoglobin is 9.5.  Patient denies rectal bleeding or dark stool. - Follow-up with CBC  Abnormal LFTs: Mild abnormal liver function.  ALP 104, AST 59, ALT 52, total bilirubin 0.6.  Likely due to recent sepsis. -Judicious use low-dose as needed Tylenol  325 mg (patient cannot use NSAID due to CKD3b)  Hypothyroidism - Continue home Synthroid  75 mcg daily - Follow-up free T4 and TSH     DVT ppx: SQ Heparin           Code Status: Full code     Family Communication:     not done, no family member is at bed side.      Disposition Plan:  Anticipate discharge back to previous environment  Consults called:  none  Admission status and Level of care: Telemetry Medical:    for obs    Dispo: The patient is from: Home  Anticipated d/c is to: Home              Anticipated d/c date is: 1 day              Patient currently is not medically stable to d/c.    Severity of Illness:  The appropriate patient status for this patient is OBSERVATION. Observation status is judged to be reasonable and necessary in order to provide the required intensity of service to ensure the patient's safety. The patient's presenting symptoms, physical exam findings, and initial radiographic and laboratory data in the context of their medical condition is felt to place them at decreased risk for further clinical deterioration. Furthermore, it is anticipated that the patient will be medically stable for discharge from the hospital within 2 midnights of admission.        Date of Service 11/23/2023    [facility]   If 7PM-7AM, please contact  night-coverage [email or web address] 11/23/2023, 1:54 AM

## 2023-11-22 NOTE — ED Triage Notes (Signed)
 Patient to ED via POV for SOB. Pt states recently admitted here for pneumonia/sepsis. States she is not feeling better.

## 2023-11-22 NOTE — ED Provider Notes (Signed)
 [facility] Provider Note    Event Date/Time   First MD Initiated Contact with Patient 11/22/23 1951     (approximate)  History   Chief Complaint: Shortness of Breath  HPI  Ashley Camacho is a 33 y.o. female with a past medical history of adrenal insufficiency, gastroparesis, hypertension, diabetes on insulin , presents to the emergency department with worsening cough shortness of breath and fatigue.  According to the patient and record review patient was discharged from the hospital on 11/16/2023 after an admission for sepsis, and pneumonia.  Patient states she has been doing well since going home however starting yesterday she began feeling more fatigued weak with worsening shortness of breath and cough.  Physical Exam   Triage Vital Signs: ED Triage Vitals  Encounter Vitals Group     BP 11/22/23 1850 (!) 133/91     Girls Systolic BP Percentile --      Girls Diastolic BP Percentile --      Boys Systolic BP Percentile --      Boys Diastolic BP Percentile --      Pulse Rate 11/22/23 1850 (!) 118     Resp 11/22/23 1850 20     Temp 11/22/23 1850 99.5 F (37.5 C)     Temp Source 11/22/23 1850 Oral     SpO2 11/22/23 1850 92 %     Weight 11/22/23 1851 131 lb (59.4 kg)     Height 11/22/23 1851 5' 1 (1.549 m)     Head Circumference --      Peak Flow --      Pain Score 11/22/23 1855 0     Pain Loc --      Pain Education --      Exclude from Growth Chart --     Most recent vital signs: Vitals:   11/22/23 1850 11/22/23 1945  BP: (!) 133/91 129/85  Pulse: (!) 118 (!) 116  Resp: 20   Temp: 99.5 F (37.5 C)   SpO2: 92% 96%    General: Awake, no distress.  CV:  Good peripheral perfusion.  Regular rate and rhythm  Resp:  Normal effort.  Equal breath sounds bilaterally.  Abd:  No distention.  Soft, nontender.  No rebound or guarding.  ED Results / Procedures / Treatments   EKG  EKG viewed and interpreted by myself shows sinus tachycardia 116 bpm  with a narrow QRS, normal axis, normal intervals, no concerning ST changes.  RADIOLOGY  I reviewed interpreted chest x-ray images.  No consolidation on my evaluation. Radiology has read the x-ray as bilateral multifocal infiltrates however decreased from prior study.   MEDICATIONS ORDERED IN ED: Medications - No data to display   IMPRESSION / MDM / ASSESSMENT AND PLAN / ED COURSE  I reviewed the triage vital signs and the nursing notes.  Patient's presentation is most consistent with acute presentation with potential threat to life or bodily function.  Patient presents to the emergency department for worsening shortness of breath cough fatigue.  Patient's chest x-ray continues to show multifocal pneumonia although somewhat decreased in severity from prior exam.  Will check labs, cultures, lactic acid.  We will IV hydrate while awaiting further results.  Patient agreeable to plan of care.  Overall patient is in no distress.  Satting around 92% on room air during my evaluation.    Patient's workup shows largely unchanged CBC.  Chemistry largely unchanged from discharge as well.  Patient continues to feel very fatigued, borderline  temperature 99.5.  Lactic acid of 1.3.  Chest x-ray contagious so multifocal pneumonia although somewhat improved from discharge.  Patient satting between 92 and 95% on room air.  Very fatigued.  Will dose IV steroids.  Will admit to the hospital service for further workup and treatment.  FINAL CLINICAL IMPRESSION(S) / ED DIAGNOSES   Dyspnea Weakness   Note:  This document was prepared using Dragon voice recognition software and may include unintentional dictation errors.   Dorothyann Drivers, MD 11/22/23 [phone]

## 2023-11-23 ENCOUNTER — Other Ambulatory Visit: Payer: Self-pay

## 2023-11-23 DIAGNOSIS — R531 Weakness: Secondary | ICD-10-CM | POA: Diagnosis not present

## 2023-11-23 LAB — CBC
HCT: 32.7 % — ABNORMAL LOW (ref 36.0–46.0)
Hemoglobin: 10.4 g/dL — ABNORMAL LOW (ref 12.0–15.0)
MCH: 30.1 pg (ref 26.0–34.0)
MCHC: 31.8 g/dL (ref 30.0–36.0)
MCV: 94.5 fL (ref 80.0–100.0)
Platelets: 161 10*3/uL (ref 150–400)
RBC: 3.46 MIL/uL — ABNORMAL LOW (ref 3.87–5.11)
RDW: 15.8 % — ABNORMAL HIGH (ref 11.5–15.5)
WBC: 3.8 10*3/uL — ABNORMAL LOW (ref 4.0–10.5)
nRBC: 0 % (ref 0.0–0.2)

## 2023-11-23 LAB — URINALYSIS, W/ REFLEX TO CULTURE (INFECTION SUSPECTED)
Bilirubin Urine: NEGATIVE
Glucose, UA: NEGATIVE mg/dL
Ketones, ur: NEGATIVE mg/dL
Leukocytes,Ua: NEGATIVE
Nitrite: NEGATIVE
Protein, ur: >=300 mg/dL — AB
Specific Gravity, Urine: 1.018 (ref 1.005–1.030)
pH: 5 (ref 5.0–8.0)

## 2023-11-23 LAB — TYPE AND SCREEN
ABO/RH(D): A POS
Antibody Screen: NEGATIVE

## 2023-11-23 LAB — GLUCOSE, CAPILLARY
Glucose-Capillary: 244 mg/dL — ABNORMAL HIGH (ref 70–99)
Glucose-Capillary: 333 mg/dL — ABNORMAL HIGH (ref 70–99)

## 2023-11-23 LAB — CORTISOL: Cortisol, Plasma: 37.3 ug/dL

## 2023-11-23 LAB — T4, FREE: Free T4: 0.91 ng/dL (ref 0.61–1.12)

## 2023-11-23 LAB — BASIC METABOLIC PANEL WITH GFR
Anion gap: 8 (ref 5–15)
BUN: 51 mg/dL — ABNORMAL HIGH (ref 6–20)
CO2: 22 mmol/L (ref 22–32)
Calcium: 7.6 mg/dL — ABNORMAL LOW (ref 8.9–10.3)
Chloride: 111 mmol/L (ref 98–111)
Creatinine, Ser: 2.08 mg/dL — ABNORMAL HIGH (ref 0.44–1.00)
GFR, Estimated: 32 mL/min — ABNORMAL LOW (ref >60)
Glucose, Bld: 209 mg/dL — ABNORMAL HIGH (ref 70–99)
Potassium: 4.7 mmol/L (ref 3.5–5.1)
Sodium: 141 mmol/L (ref 135–145)

## 2023-11-23 LAB — MAGNESIUM: Magnesium: 2.5 mg/dL — ABNORMAL HIGH (ref 1.7–2.4)

## 2023-11-23 LAB — TSH: TSH: 1.778 u[IU]/mL (ref 0.350–4.500)

## 2023-11-23 LAB — APTT: aPTT: 30 s (ref 24–36)

## 2023-11-23 MED ORDER — DM-GUAIFENESIN ER 30-600 MG PO TB12
1.0000 | ORAL_TABLET | Freq: Two times a day (BID) | ORAL | 0 refills | Status: DC | PRN
  Filled 2023-11-23: qty 20, 10d supply, fill #0

## 2023-11-23 MED ORDER — MAGNESIUM SULFATE 2 GM/50ML IV SOLN
2.0000 g | Freq: Once | INTRAVENOUS | Status: AC
  Administered 2023-11-23: 2 g via INTRAVENOUS
  Filled 2023-11-23: qty 50

## 2023-11-23 NOTE — Discharge Summary (Signed)
 Physician Discharge Summary   Patient: Ashley Camacho MRN: [id number] DOB: 1990/12/24  Admit date:     11/22/2023  Discharge date: 11/23/23  Discharge Physician: Drue ONEIDA Potter   PCP: Ostwalt, [name], PA-C   Recommendations at discharge:    Discharge Diagnoses:  Generalized weakness:  Adrenal insufficiency (Addison's disease) (HCC) Recent HCAP (healthcare-associated pneumonia):  Chronic diastolic CHF (congestive heart failure) (HCC): HTN (hypertension) Type 1 diabetes mellitus (HCC):  CKD stage 3b, GFR 30-44 ml/min (HCC):  Normocytic anemia:  Abnormal LFTs:  Hypothyroidism   Hospital Course: Ashley Camacho is a 33 y.o. female with medical history significant of adrenal insufficiency, type 1 diabetes, HTN, dCHF, hypothyroidism, depression, CKD stage IIIb, anemia, DVT not on anticoagulants, gastroparesis, recent admission due to sepsis secondary to pneumonia, who presents with generalized weakness. Patient was recently hospitalized from 6/22 - 6/25 due to sepsis secondary to pneumonia.  Patient was discharged on Levaquin  for 6 days.  She states that she completed course of Levaquin .  She continues to have mild cough with little yellow-colored sputum production and mild shortness of breath.  Chest x-ray did not show any worsening of infiltrate.  She admits to improvement in respiratory function and therefore being discharged today to follow-up with PCP.   Consultants: None Procedures performed: None Disposition: Home Diet recommendation:  Cardiac diet DISCHARGE MEDICATION: Allergies as of 11/23/2023   No Known Allergies      Medication List     TAKE these medications    acetaminophen  500 MG tablet Commonly known as: TYLENOL  Take 2 tablets (1,000 mg total) by mouth every 6 (six) hours as needed for headache, fever or moderate pain.   Baqsimi One Pack 3 MG/DOSE Powd Generic drug: Glucagon Place 3 mg into the nose as needed.   Blood Pressure Cuff Misc Use as directed    cholecalciferol  25 MCG (1000 UNIT) tablet Commonly known as: VITAMIN D3   Creon  24000-76000 units Cpep Generic drug: Pancrelipase  (Lip-Prot-Amyl) Take 1 capsule (24,000 Units total) by mouth 3 (three) times daily before meals.   Dexcom G7 Sensor Misc SMARTSIG:1 Each Every 10 Days   divalproex  250 MG DR tablet Commonly known as: DEPAKOTE  Take 250 mg by mouth daily.   hydrocortisone  10 MG tablet Commonly known as: CORTEF  Take 2 tablets (20 mg total) by mouth daily with breakfast AND 1 tablet (10 mg total) daily with supper.   insulin  aspart 100 UNIT/ML FlexPen Commonly known as: NOVOLOG  Inject 2 Units into the skin 3 (three) times daily with meals. If eating and Blood Glucose (BG) 80 or higher inject 2 units for meal coverage and add correction dose per scale. If not eating, correction dose only. BG <150= 0 unit; BG 150-200= 1 unit; BG 201-250= 3 unit; BG 251-300= 5 unit; BG 301-350= 7 unit; BG 351-400= 9 unit; BG >400= 11 unit and Call Primary Care.   insulin  glargine 100 UNIT/ML Solostar Pen Commonly known as: LANTUS  Inject 10 Units into the skin daily.   levothyroxine  75 MCG tablet Commonly known as: SYNTHROID  TAKE 1 TABLET (75 MCG TOTAL) BY MOUTH DAILY AT 6 (SIX) AM.   metoprolol  tartrate 25 MG tablet Commonly known as: LOPRESSOR  Take 1 tablet (25 mg total) by mouth 2 (two) times daily.   Mucinex  DM 30-600 MG Tb12 Take 1 tablet by mouth 2 (two) times daily as needed for cough.   Pen Needles 31G X 6 MM Misc PEN NEEDLES 31G X 6 MM   BD Pen Needle Mini Ultrafine 31G X  5 MM Misc Generic drug: Insulin  Pen Needle by Other route.   saccharomyces boulardii 250 MG capsule Commonly known as: FLORASTOR Take 250 mg by mouth 2 (two) times daily.        Discharge Exam: Filed Weights   11/22/23 1851  Weight: 59.4 kg   General: Not in acute distress Heme: No neck lymph node enlargement. Cardiac: S1/S2, RRR, No murmurs, No gallops or rubs. Respiratory: No rales,  wheezing, rhonchi or rubs. GI: Soft, nondistended, nontender, no rebound pain,  GU: No hematuria Ext: has trace leg edema bilaterally. 1+DP/PT pulse bilaterally. Musculoskeletal: No joint deformities, No joint redness or warmth, no limitation of ROM in spin. Skin: No rashes.  Neuro: Alert, oriented X3, cranial nerves II-XII grossly intact, moves all extremities normally.  Psych: Patient is not psychotic, no suicidal or hemocidal ideation     Condition at discharge: good  The results of significant diagnostics from this hospitalization (including imaging, microbiology, ancillary and laboratory) are listed below for reference.   Imaging Studies: DG Chest 1 View Result Date: 11/22/2023 CLINICAL DATA:  Patient recently admitted for pneumonia/sepsis, presenting with persistent shortness of breath. EXAM: CHEST  1 VIEW COMPARISON:  November 13, 2023 FINDINGS: The cardiac silhouette is mildly enlarged, likely technical in origin. Low lung volumes are noted with subsequent crowding of the bronchovascular lung markings. Mild bilateral multifocal infiltrates are seen, decreased in severity when compared to the prior study. No pleural effusion or pneumothorax is identified. The visualized skeletal structures are unremarkable. IMPRESSION: Low lung volumes with mild bilateral multifocal infiltrates, decreased in severity when compared to the prior study. Electronically Signed   By: Suzen Dials M.D.   On: 11/22/2023 19:27   DG Chest Port 1 View Result Date: 11/13/2023 CLINICAL DATA:  Sepsis.  Generalized weakness with cough and cold. EXAM: PORTABLE CHEST 1 VIEW COMPARISON:  10/31/2023 FINDINGS: Heart size and mediastinal contours are normal. No pleural effusion. Multifocal airspace disease is noted within the upper and lower right lung and left lower lung. Visualized osseous structures are unremarkable. IMPRESSION: Multifocal airspace disease compatible with pneumonia. Electronically Signed   By: Waddell Calk M.D.   On: 11/13/2023 12:11   ECHOCARDIOGRAM COMPLETE Result Date: 11/03/2023    ECHOCARDIOGRAM REPORT   Patient Name:   Ashley Camacho Date of Exam: 11/02/2023 Medical Rec #:  [id number]       Height:       61.0 in Accession #:    [id number]      Weight:       108.0 lb Date of Birth:  08-Sep-1990      BSA:          1.454 m Patient Age:    32 years        BP:           162/102 mmHg Patient Gender: F               HR:           63 bpm. Exam Location:  [facility] Procedure: 2D Echo, 3D Echo, Cardiac Doppler, Color Doppler and Strain Analysis            (Both Spectral and Color Flow Doppler were utilized during            procedure). Indications:     R78.81 Bacteremia  History:         Patient has prior history of Echocardiogram examinations, most  recent 07/20/2023. CHF; Risk Factors:Hypertension and Diabetes.  Sonographer:     Carl Coma RDCS Referring Phys:  [id number] DAVID P FITZGERALD Diagnosing Phys: Deatrice Cage MD IMPRESSIONS  1. Left ventricular ejection fraction, by estimation, is 55 to 60%. The left ventricle has normal function. The left ventricle has no regional wall motion abnormalities. Left ventricular diastolic parameters were normal. The average left ventricular global longitudinal strain is -23.4 %. The global longitudinal strain is normal.  2. Right ventricular systolic function is normal. The right ventricular size is normal. There is normal pulmonary artery systolic pressure.  3. The mitral valve is normal in structure. Mild to moderate mitral valve regurgitation. No evidence of mitral stenosis.  4. The aortic valve is normal in structure. Aortic valve regurgitation is not visualized. No aortic stenosis is present.  5. The inferior vena cava is normal in size with greater than 50% respiratory variability, suggesting right atrial pressure of 3 mmHg. Conclusion(s)/Recommendation(s): No evidence of valvular vegetations on this transthoracic echocardiogram. FINDINGS  Left  Ventricle: Left ventricular ejection fraction, by estimation, is 55 to 60%. The left ventricle has normal function. The left ventricle has no regional wall motion abnormalities. The average left ventricular global longitudinal strain is -23.4 %. Strain was performed and the global longitudinal strain is normal. The left ventricular internal cavity size was normal in size. There is no left ventricular hypertrophy. Left ventricular diastolic parameters were normal. Right Ventricle: The right ventricular size is normal. No increase in right ventricular wall thickness. Right ventricular systolic function is normal. There is normal pulmonary artery systolic pressure. The tricuspid regurgitant velocity is 2.54 m/s, and  with an assumed right atrial pressure of 3 mmHg, the estimated right ventricular systolic pressure is 28.8 mmHg. Left Atrium: Left atrial size was normal in size. Right Atrium: Right atrial size was normal in size. Pericardium: There is no evidence of pericardial effusion. Mitral Valve: The mitral valve is normal in structure. Mild to moderate mitral valve regurgitation. No evidence of mitral valve stenosis. Tricuspid Valve: The tricuspid valve is normal in structure. Tricuspid valve regurgitation is mild . No evidence of tricuspid stenosis. Aortic Valve: The aortic valve is normal in structure. Aortic valve regurgitation is not visualized. No aortic stenosis is present. Pulmonic Valve: The pulmonic valve was normal in structure. Pulmonic valve regurgitation is mild. No evidence of pulmonic stenosis. Aorta: The aortic root is normal in size and structure. Venous: The inferior vena cava is normal in size with greater than 50% respiratory variability, suggesting right atrial pressure of 3 mmHg. IAS/Shunts: No atrial level shunt detected by color flow Doppler. Additional Comments: 3D was performed not requiring image post processing on an independent workstation and was normal.  LEFT VENTRICLE PLAX 2D LVIDd:          4.60 cm   Diastology LVIDs:         3.20 cm   LV e' medial:    9.51 cm/s LV PW:         0.60 cm   LV E/e' medial:  12.9 LV IVS:        0.50 cm   LV e' lateral:   14.63 cm/s LVOT diam:     1.60 cm   LV E/e' lateral: 8.4 LV SV:         49 LV SV Index:   33        2D Longitudinal Strain LVOT Area:     2.01 cm  2D Strain GLS (A4C):   -  24.5 %                          2D Strain GLS (A3C):   -22.1 %                          2D Strain GLS (A2C):   -23.5 %                          2D Strain GLS Avg:     -23.4 %                           3D Volume EF:                          3D EF:        55 %                          LV EDV:       131 ml                          LV ESV:       59 ml                          LV SV:        71 ml RIGHT VENTRICLE             IVC RV Basal diam:  3.30 cm     IVC diam: 1.10 cm RV S prime:     11.85 cm/s TAPSE (M-mode): 2.4 cm LEFT ATRIUM             Index        RIGHT ATRIUM           Index LA diam:        4.10 cm 2.82 cm/m   RA Area:     12.70 cm LA Vol (A2C):   44.0 ml 30.27 ml/m  RA Volume:   33.10 ml  22.77 ml/m LA Vol (A4C):   39.3 ml 27.04 ml/m LA Biplane Vol: 42.9 ml 29.51 ml/m  AORTIC VALVE LVOT Vmax:   98.50 cm/s LVOT Vmean:  65.750 cm/s LVOT VTI:    0.242 m  AORTA Ao Root diam: 2.70 cm Ao Asc diam:  2.80 cm MITRAL VALVE                TRICUSPID VALVE MV Area (PHT): 4.58 cm     TR Peak grad:   25.8 mmHg MV Decel Time: 166 msec     TR Vmax:        254.00 cm/s MV E velocity: 122.50 cm/s MV A velocity: 95.30 cm/s   SHUNTS MV E/A ratio:  1.29         Systemic VTI:  0.24 m                             Systemic Diam: 1.60 cm Deatrice Cage MD Electronically signed by Deatrice Cage MD Signature Date/Time: 11/03/2023/7:45:54 AM    Final    CT ABDOMEN PELVIS W CONTRAST Result Date: 10/31/2023 CLINICAL DATA:  Bowel obstruction suspected EXAM: CT ABDOMEN AND PELVIS  WITH CONTRAST TECHNIQUE: Multidetector CT imaging of the abdomen and pelvis was performed using the standard protocol  following bolus administration of intravenous contrast. RADIATION DOSE REDUCTION: This exam was performed according to the departmental dose-optimization program which includes automated exposure control, adjustment of the mA and/or kV according to patient size and/or use of iterative reconstruction technique. CONTRAST:  75mL OMNIPAQUE  IOHEXOL  300 MG/ML  SOLN COMPARISON:  07/23/2023 FINDINGS: Lower Chest: Normal. Hepatobiliary: Normal hepatic contours. No intra- or extrahepatic biliary dilatation. The gallbladder is normal. Pancreas: Normal pancreas. No ductal dilatation or peripancreatic fluid collection. Spleen: Normal. Adrenals/Urinary Tract: The adrenal glands are normal. No hydronephrosis, nephroureterolithiasis or solid renal mass. Mild wall thickening, possibly exaggerated by underdistention. Stomach/Bowel: There is no hiatal hernia. Normal duodenal course and caliber. No small bowel dilatation or inflammation. No focal colonic abnormality. The appendix is not visualized. No right lower quadrant inflammation or free fluid. Vascular/Lymphatic: Normal course and caliber of the major abdominal vessels. No abdominal or pelvic lymphadenopathy. Reproductive: Normal uterus. No adnexal mass. Other: None. Musculoskeletal: No bony spinal canal stenosis or focal osseous abnormality. IMPRESSION: 1. No acute abnormality of the abdomen or pelvis. 2. Mild wall thickening of the urinary bladder, possibly exaggerated by underdistention. Correlate with urinalysis to exclude cystitis. Electronically Signed   By: Franky Stanford M.D.   On: 10/31/2023 04:24   DG Chest Port 1 View Result Date: 10/31/2023 CLINICAL DATA:  Sepsis EXAM: PORTABLE CHEST 1 VIEW COMPARISON:  05/26/2023 FINDINGS: The heart size and mediastinal contours are within normal limits. Both lungs are clear. The visualized skeletal structures are unremarkable. IMPRESSION: No active disease. Electronically Signed   By: Franky Stanford M.D.   On: 10/31/2023 03:18     Microbiology: Results for orders placed or performed during the hospital encounter of 11/22/23  Culture, blood (Routine x 2)     Status: None (Preliminary result)   Collection Time: 11/22/23  8:42 PM   Specimen: BLOOD  Result Value Ref Range Status   Specimen Description BLOOD BLOOD RIGHT ARM  Final   Special Requests   Final    BOTTLES DRAWN AEROBIC AND ANAEROBIC Blood Culture adequate volume   Culture   Final    NO GROWTH < 12 HOURS Performed at [facility], [address] [place], [place] [address]    Report Status PENDING  Incomplete  Culture, blood (Routine x 2)     Status: None (Preliminary result)   Collection Time: 11/22/23  8:43 PM   Specimen: BLOOD  Result Value Ref Range Status   Specimen Description BLOOD BLOOD RIGHT ARM  Final   Special Requests   Final    BOTTLES DRAWN AEROBIC AND ANAEROBIC Blood Culture results may not be optimal due to an inadequate volume of blood received in culture bottles   Culture   Final    NO GROWTH < 12 HOURS Performed at [facility], [address] [place], [place] [address]    Report Status PENDING  Incomplete    Labs: CBC: Recent Labs  Lab 11/22/23 2042 11/23/23 0538  WBC 6.7 3.8*  NEUTROABS 5.7  --   HGB 9.5* 10.4*  HCT 30.0* 32.7*  MCV 93.8 94.5  PLT 173 161   Basic Metabolic Panel: Recent Labs  Lab 11/22/23 2042 11/23/23 0538  NA 137 141  K 3.9 4.7  CL 105 111  CO2 23 22  GLUCOSE 74 209*  BUN 54* 51*  CREATININE 2.10* 2.08*  CALCIUM  7.6* 7.6*  MG 1.5* 2.5*  PHOS 3.3  --    Liver Function Tests: Recent Labs  Lab 11/22/23 2042  AST 59*  ALT 52*  ALKPHOS 104  BILITOT 0.6  PROT 5.1*  ALBUMIN  2.9*   CBG: Recent Labs  Lab 11/22/23 2332 11/23/23 0938 11/23/23 1133  GLUCAP 105* 244* 333*    Discharge time spent:  37 minutes.  Signed: Drue ONEIDA Potter, MD [facility] 11/23/2023

## 2023-11-24 ENCOUNTER — Telehealth: Payer: Self-pay

## 2023-11-24 NOTE — Transitions of Care (Post Inpatient/ED Visit) (Signed)
 11/24/2023  Name: Ashley Camacho MRN: [id number] DOB: [birth year]  Today's TOC FU Call Status: TOC FU Call Complete Date: 11/24/23 Patient's Name and Date of Birth confirmed.  Transition Care Management Follow-up Telephone Call Date of Discharge: 11/23/23 Discharge Facility: [facility]) Type of Discharge: Inpatient Admission Primary Inpatient Discharge Diagnosis:: [facility] How have you been since you were released from the hospital?: Better Any questions or concerns?: No  Items Reviewed: Did you receive and understand the discharge instructions provided?: Yes Medications obtained,verified, and reconciled?: Yes (Medications Reviewed) Any new allergies since your discharge?: No Dietary orders reviewed?: Yes Do you have support at home?: No  Medications Reviewed Today: Medications Reviewed Today     Reviewed by Emmitt Pan, LPN (Licensed Practical Nurse) on 11/24/23 at [phone]  Med List Status: <None>   Medication Order Taking? Sig Documenting Provider Last Dose Status Informant  acetaminophen  (TYLENOL ) 500 MG tablet [id number] Yes Take 2 tablets (1,000 mg total) by mouth every 6 (six) hours as needed for headache, fever or moderate pain. Dorinda Drue DASEN, MD  Active Self, Pharmacy Records, Other           Med Note [facility], TIFFANY A   Wed Nov 23, 2023 10:22 AM) prn  BAQSIMI ONE PACK 3 MG/DOSE POWD [id number] Yes Place 3 mg into the nose as needed. [provider]  Active Self, Pharmacy Records, Other           Med Note ([facility], TIFFANY A   Wed Nov 23, 2023 10:23 AM) prn  BD PEN NEEDLE MINI ULTRAFINE 31G X 5 MM MISC [id number] Yes by Other route. [provider]  Active Self, Pharmacy Records, Other  Blood Pressure Monitoring (BLOOD PRESSURE CUFF) MISC [id number]  Use as directed  Patient not taking: Reported on 11/24/2023   Loistine Sober, NP  Active Self, Pharmacy Records, Other  cholecalciferol  (VITAMIN D3) 25 MCG (1000 UNIT) tablet  [id number] Yes  [provider]  Active Self, Pharmacy Records, Other  Continuous Glucose Sensor (DEXCOM G7 SENSOR) MISC [id number] Yes SMARTSIG:1 Each Every 10 Days [provider]  Active Self, Pharmacy Records, Other  dextromethorphan -guaiFENesin  (MUCINEX  DM) 30-600 MG 12hr tablet [id number] Yes Take 1 tablet by mouth 2 (two) times daily as needed for cough. Dorinda Drue DASEN, MD  Active   divalproex  (DEPAKOTE ) 250 MG DR tablet [id number] Yes Take 250 mg by mouth daily. [provider]  Active Self, Pharmacy Records, Other  hydrocortisone  (CORTEF ) 10 MG tablet [id number]  Take 2 tablets (20 mg total) by mouth daily with breakfast AND 1 tablet (10 mg total) daily with supper.  Patient not taking: No sig reported   [name], MD  Active Self, Pharmacy Records, Other  insulin  aspart (NOVOLOG ) 100 UNIT/ML FlexPen [id number] Yes Inject 2 Units into the skin 3 (three) times daily with meals. If eating and Blood Glucose (BG) 80 or higher inject 2 units for meal coverage and add correction dose per scale. If not eating, correction dose only. BG <150= 0 unit; BG 150-200= 1 unit; BG 201-250= 3 unit; BG 251-300= 5 unit; BG 301-350= 7 unit; BG 351-400= 9 unit; BG >400= 11 unit and Call Primary Care. [name], MD  Active Self, Pharmacy Records, Other           Med Note [facility]RENA, [facility] D   Mon Nov 14, 2023 10:26 AM)    insulin  glargine (LANTUS ) 100 UNIT/ML Solostar Pen [id number] Yes Inject 10 Units into the skin daily. Wieting,  Charlie, MD  Active Self, Pharmacy Records, Other           Med Note TRENA, [facility] D   Mon Nov 14, 2023 10:27 AM)    Insulin  Pen Needle (PEN NEEDLES) 31G X 6 MM MISC [id number] Yes PEN NEEDLES 31G X 6 MM [provider]  Active Self, Pharmacy Records, Other  levothyroxine  (SYNTHROID ) 75 MCG tablet [id number] Yes TAKE 1 TABLET (75 MCG TOTAL) BY MOUTH DAILY AT 6 (SIX) AM. Ostwalt, [name], PA-C  Active Self, Pharmacy Records, Other  metoprolol   tartrate (LOPRESSOR ) 25 MG tablet [id number] Yes Take 1 tablet (25 mg total) by mouth 2 (two) times daily. Trudy Anthony HERO, MD  Active Self, Other, Pharmacy Records  Pancrelipase , Lip-Prot-Amyl, 24000-76000 units CPEP [id number] Yes Take 1 capsule (24,000 Units total) by mouth 3 (three) times daily before meals. [name], MD  Active Self, Pharmacy Records, Other  saccharomyces boulardii [facility]) 250 MG capsule [id number]  Take 250 mg by mouth 2 (two) times daily.  Patient not taking: Reported on 11/24/2023   [provider]  Active Self, Pharmacy Records, Other  Med List Note Joyice Ileana FALCON, [place] 11/22/22 9584): Pt not consistent with medications. Recent admission             Home Care and Equipment/Supplies: Were Home Health Services Ordered?: NA Any new equipment or medical supplies ordered?: NA  Functional Questionnaire: Do you need assistance with bathing/showering or dressing?: No Do you need assistance with meal preparation?: No Do you need assistance with eating?: No Do you have difficulty maintaining continence: No Do you need assistance with getting out of bed/getting out of a chair/moving?: No Do you have difficulty managing or taking your medications?: No  Follow up appointments reviewed: PCP Follow-up appointment confirmed?: NA (declined appt) Specialist Hospital Follow-up appointment confirmed?: NA Do you need transportation to your follow-up appointment?: No Do you understand care options if your condition(s) worsen?: Yes-patient verbalized understanding    SIGNATURE Julian Lemmings, LPN [facility] Nurse Health Advisor Direct Dial [phone]

## 2023-11-27 LAB — CULTURE, BLOOD (ROUTINE X 2)
Culture: NO GROWTH
Culture: NO GROWTH
Special Requests: ADEQUATE

## 2023-11-28 ENCOUNTER — Ambulatory Visit: Payer: Self-pay | Admitting: Physician Assistant

## 2023-12-02 ENCOUNTER — Ambulatory Visit: Admitting: Physician Assistant

## 2023-12-03 DIAGNOSIS — Z419 Encounter for procedure for purposes other than remedying health state, unspecified: Secondary | ICD-10-CM | POA: Diagnosis not present

## 2023-12-04 ENCOUNTER — Other Ambulatory Visit: Payer: Self-pay | Admitting: Physician Assistant

## 2023-12-04 DIAGNOSIS — E038 Other specified hypothyroidism: Secondary | ICD-10-CM

## 2023-12-06 NOTE — Telephone Encounter (Signed)
 Requested Prescriptions  Pending Prescriptions Disp Refills   levothyroxine  (SYNTHROID ) 75 MCG tablet [Pharmacy Med Name: LEVOTHYROXINE  75 MCG TABLET] 90 tablet 0    Sig: TAKE 1 TABLET (75 MCG TOTAL) BY MOUTH DAILY AT 6 (SIX) AM.     Endocrinology:  Hypothyroid Agents Passed - 12/06/2023 10:52 AM      Passed - TSH in normal range and within 360 days    TSH  Date Value Ref Range Status  11/23/2023 1.778 0.350 - 4.500 uIU/mL Final    Comment:    Performed by a 3rd Generation assay with a functional sensitivity of <=0.01 uIU/mL. Performed at [facility], [address] [place], [place] [address]   10/21/2023 1.880 0.450 - 4.500 uIU/mL Final         Passed - Valid encounter within last 12 months    Recent Outpatient Visits           1 month ago Type 1 diabetes mellitus without complications [facility])   [facility] [employer], [name], PA-C

## 2023-12-21 ENCOUNTER — Ambulatory Visit: Payer: Self-pay

## 2023-12-21 ENCOUNTER — Ambulatory Visit (INDEPENDENT_AMBULATORY_CARE_PROVIDER_SITE_OTHER): Admitting: Physician Assistant

## 2023-12-21 ENCOUNTER — Encounter: Payer: Self-pay | Admitting: Physician Assistant

## 2023-12-21 VITALS — BP 126/85 | HR 89 | Resp 14 | Ht 61.0 in | Wt 138.7 lb

## 2023-12-21 DIAGNOSIS — M7989 Other specified soft tissue disorders: Secondary | ICD-10-CM

## 2023-12-21 DIAGNOSIS — I5020 Unspecified systolic (congestive) heart failure: Secondary | ICD-10-CM

## 2023-12-21 DIAGNOSIS — R600 Localized edema: Secondary | ICD-10-CM | POA: Diagnosis not present

## 2023-12-21 DIAGNOSIS — E2740 Unspecified adrenocortical insufficiency: Secondary | ICD-10-CM

## 2023-12-21 DIAGNOSIS — Z86718 Personal history of other venous thrombosis and embolism: Secondary | ICD-10-CM

## 2023-12-21 DIAGNOSIS — F419 Anxiety disorder, unspecified: Secondary | ICD-10-CM | POA: Diagnosis not present

## 2023-12-21 DIAGNOSIS — N183 Chronic kidney disease, stage 3 unspecified: Secondary | ICD-10-CM | POA: Diagnosis not present

## 2023-12-21 DIAGNOSIS — D631 Anemia in chronic kidney disease: Secondary | ICD-10-CM

## 2023-12-21 DIAGNOSIS — F339 Major depressive disorder, recurrent, unspecified: Secondary | ICD-10-CM

## 2023-12-21 DIAGNOSIS — N1832 Chronic kidney disease, stage 3b: Secondary | ICD-10-CM | POA: Diagnosis not present

## 2023-12-21 DIAGNOSIS — E039 Hypothyroidism, unspecified: Secondary | ICD-10-CM | POA: Diagnosis not present

## 2023-12-21 NOTE — Telephone Encounter (Signed)
 FYI Only or Action Required?: FYI only for provider.  Patient was last seen in primary care on .  Called Nurse Triage reporting No chief complaint on file..  Symptoms began several weeks ago.  Interventions attempted: Nothing.  Symptoms are: stable.  Triage Disposition: See HCP Within 4 Hours (Or PCP Triage)  Patient/caregiver understands and will follow disposition?: Yes   Copied from CRM [id number]. Topic: Clinical - Red Word Triage >> Dec 21, 2023 11:42 AM Ashley Camacho wrote: Red Word that prompted transfer to Nurse Triage: Leg swelling in both legs, on and off for a while. Reason for Disposition  SEVERE leg swelling (e.g., swelling extends above knee, entire leg is swollen, weeping fluid)  Answer Assessment - Initial Assessment Questions 1. ONSET: When did the swelling start? (e.g., minutes, hours, days)     Since the beginning of the month  2. LOCATION: What part of the leg is swollen?  Are both legs swollen or just one leg?     Both legs  3. SEVERITY: How bad is the swelling? (e.g., localized; mild, moderate, severe)     Severe  4. REDNESS: Is there redness or signs of infection?     No  5. PAIN: Is the swelling painful to touch? If Yes, ask: How painful is it?   (Scale 1-10; mild, moderate or severe)     Intermittent, 3-4  6. FEVER: Do you have a fever? If Yes, ask: What is it, how was it measured, and when did it start?      No  7. CAUSE: What do you think is causing the leg swelling?     Unsure  8. MEDICAL HISTORY: Do you have a history of blood clots (e.g., DVT), cancer, heart failure, kidney disease, or liver failure?     Congestive Heart Failure  9. RECURRENT SYMPTOM: Have you had leg swelling before? If Yes, ask: When was the last time? What happened that time?     No  10. OTHER SYMPTOMS: Do you have any other symptoms? (e.g., chest pain, difficulty breathing)       Tightness (Mild)  11. PREGNANCY: Is there any chance you  are pregnant? When was your last menstrual period?       No and Hysterectomy  Protocols used: Leg Swelling and Edema-A-AH

## 2023-12-21 NOTE — Progress Notes (Signed)
 Established patient visit  Patient: Ashley Camacho   DOB: June 07, 1990   32 y.o. Female  MRN: [id number] Visit Date: 12/21/2023  Today's healthcare provider: Jolynn Spencer, PA-C   Chief Complaint  Patient presents with   Back Pain    Mild back pain radiates to hip radiating to bilateral leg swelling ongoing since the beginning of the month. When swelling occurs pain is 3-4, pt has tried elevation helps in the morning at night goes back to the same.   Subjective      Discussed the use of AI scribe software for clinical note transcription with the patient, who gave verbal consent to proceed.  History of Present Illness Ashley Camacho is a 33 year old female with congestive heart failure who presents with persistent leg swelling.  She has experienced persistent leg swelling since her hospital discharge in early July. The swelling is uncomfortable and worsens by the end of the day. Compression stockings are ineffective due to the severity of the swelling.  She is not currently on medication for heart failure but is taking medication for blood pressure, with a recent reading of 120/80 mmHg. She denies chest pain, rapid heartbeats, and shortness of breath.  She has a history of acute kidney injury and is scheduled to see a nephrologist on August 20th. She needs to make an appointment with her endocrinologist for adrenal insufficiency.  She experiences more anxiety than depression and is not on medication for these conditions. She manages her care with minimal assistance from family.       12/21/2023    1:42 PM 10/21/2023    2:58 PM 05/10/2023   10:25 AM  Depression screen PHQ 2/9  Decreased Interest 1 0 2  Down, Depressed, Hopeless 2 2 3   PHQ - 2 Score 3 2 5   Altered sleeping 1 2 3   Tired, decreased energy 2 2 3   Change in appetite 2 0 3  Feeling bad or failure about yourself  2 2 3   Trouble concentrating 2 0 1  Moving slowly or fidgety/restless 2 2 2   Suicidal thoughts 1 0 3   PHQ-9 Score 15 10 23   Difficult doing work/chores Somewhat difficult Somewhat difficult Very difficult      12/21/2023    1:43 PM 10/21/2023    2:58 PM 05/10/2023   10:27 AM  GAD 7 : Generalized Anxiety Score  Nervous, Anxious, on Edge 2 2 2   Control/stop worrying 2 2 2   Worry too much - different things 2 3 2   Trouble relaxing 1 1 1   Restless 2 2 1   Easily annoyed or irritable 2 1 3   Afraid - awful might happen 0 0 0  Total GAD 7 Score 11 11 11   Anxiety Difficulty Somewhat difficult Somewhat difficult Very difficult    Medications: Outpatient Medications Prior to Visit  Medication Sig   acetaminophen  (TYLENOL ) 500 MG tablet Take 2 tablets (1,000 mg total) by mouth every 6 (six) hours as needed for headache, fever or moderate pain.   BAQSIMI ONE PACK 3 MG/DOSE POWD Place 3 mg into the nose as needed.   BD PEN NEEDLE MINI ULTRAFINE 31G X 5 MM MISC by Other route.   cholecalciferol  (VITAMIN D3) 25 MCG (1000 UNIT) tablet    Continuous Glucose Sensor (DEXCOM G7 SENSOR) MISC SMARTSIG:1 Each Every 10 Days   hydrocortisone  (CORTEF ) 10 MG tablet Take 2 tablets (20 mg total) by mouth daily with breakfast AND 1 tablet (10 mg total) daily with supper.  insulin  aspart (NOVOLOG ) 100 UNIT/ML FlexPen Inject 2 Units into the skin 3 (three) times daily with meals. If eating and Blood Glucose (BG) 80 or higher inject 2 units for meal coverage and add correction dose per scale. If not eating, correction dose only. BG <150= 0 unit; BG 150-200= 1 unit; BG 201-250= 3 unit; BG 251-300= 5 unit; BG 301-350= 7 unit; BG 351-400= 9 unit; BG >400= 11 unit and Call Primary Care.   insulin  glargine (LANTUS ) 100 UNIT/ML Solostar Pen Inject 10 Units into the skin daily.   Insulin  Pen Needle (PEN NEEDLES) 31G X 6 MM MISC PEN NEEDLES 31G X 6 MM   levothyroxine  (SYNTHROID ) 75 MCG tablet TAKE 1 TABLET (75 MCG TOTAL) BY MOUTH DAILY AT 6 (SIX) AM.   metoprolol  tartrate (LOPRESSOR ) 25 MG tablet Take 1 tablet (25 mg total)  by mouth 2 (two) times daily.   Pancrelipase , Lip-Prot-Amyl, 24000-76000 units CPEP Take 1 capsule (24,000 Units total) by mouth 3 (three) times daily before meals.   Blood Pressure Monitoring (BLOOD PRESSURE CUFF) MISC Use as directed (Patient not taking: Reported on 12/21/2023)   dextromethorphan -guaiFENesin  (MUCINEX  DM) 30-600 MG 12hr tablet Take 1 tablet by mouth 2 (two) times daily as needed for cough.   saccharomyces boulardii (FLORASTOR) 250 MG capsule Take 250 mg by mouth 2 (two) times daily. (Patient not taking: Reported on 11/24/2023)   No facility-administered medications prior to visit.    Review of Systems All negative Except see HPI       Objective    BP 126/85 (BP Location: Right Arm, Patient Position: Sitting, Cuff Size: Normal)   Pulse 89   Resp 14   Ht 5' 1 (1.549 m)   Wt 138 lb 11.2 oz (62.9 kg)   SpO2 98%   BMI 26.21 kg/m     Physical Exam Vitals reviewed.  Constitutional:      General: She is not in acute distress.    Appearance: Normal appearance. She is well-developed. She is not diaphoretic.  HENT:     Head: Normocephalic and atraumatic.  Eyes:     General: No scleral icterus.    Conjunctiva/sclera: Conjunctivae normal.  Neck:     Thyroid : No thyromegaly.  Cardiovascular:     Rate and Rhythm: Normal rate and regular rhythm.     Pulses: Normal pulses.     Heart sounds: Normal heart sounds. No murmur heard. Pulmonary:     Effort: Pulmonary effort is normal. No respiratory distress.     Breath sounds: Normal breath sounds. No wheezing, rhonchi or rales.  Musculoskeletal:     Cervical back: Neck supple.     Right lower leg: No edema.     Left lower leg: No edema.  Lymphadenopathy:     Cervical: No cervical adenopathy.  Skin:    General: Skin is warm and dry.     Findings: No rash.  Neurological:     Mental Status: She is alert and oriented to person, place, and time. Mental status is at baseline.  Psychiatric:        Mood and Affect: Mood  normal.        Behavior: Behavior normal.      No results found for any visits on 12/21/23.      Assessment & Plan Bilateral lower extremity edema Persistent edema since July, worsens by day's end. Differential includes heart failure, CKD, DVT. Compression stockings ineffective. - Order bilateral lower extremity ultrasound to rule out DVT. - Advise wearing compression  stockings from nurse uniform stores. - Instruct to elevate legs when possible. Was seen by cardiology in 08/2023 for cardiomyopathy, MR and dependent edema. Heart failure with reduced ejection fraction Chronic No current heart failure medications. Swelling may relate to heart failure exacerbation. - Refer to heart failure clinic. - Instruct to contact cardiologist for follow-up appointment/EKG. - Advise elevating legs and wearing compression stockings.  Chronic kidney disease Chronic kidney disease with dropping GFR. Awaiting nephrology appointment. Concerns about fluid management and edema impact. - Refer to nephrology. - Advise careful fluid intake and avoid NSAIDs. - Attempt to expedite nephrology referral. Will follow-up  Adrenal insufficiency Chronic Adrenal insufficiency requires endocrinology follow-up. No recent appointments. Was seen on 08/15/23 by endocrinology , was rx ed cortef , nasal glucagon for severe hypoglycemia. - Instruct to schedule appointment with endocrinologist.  Depression and anxiety    12/21/2023    1:42 PM 10/21/2023    2:58 PM 05/10/2023   10:25 AM  PHQ9 SCORE ONLY  PHQ-9 Total Score 15 10 23       12/21/2023    1:43 PM 10/21/2023    2:58 PM 05/10/2023   10:27 AM  GAD 7 : Generalized Anxiety Score  Nervous, Anxious, on Edge 2 2 2   Control/stop worrying 2 2 2   Worry too much - different things 2 3 2   Trouble relaxing 1 1 1   Restless 2 2 1   Easily annoyed or irritable 2 1 3   Afraid - awful might happen 0 0 0  Total GAD 7 Score 11 11 11   Anxiety Difficulty Somewhat difficult  Somewhat difficult Very difficult   Ongoing depression and anxiety, more anxiety reported despite scoring on gad7 - 11 and on phq9 - 15. No current medication or recent psychiatric follow-up. - Refer to integrative behavioral health for an assessment and a possible pharmacological management. - Set up appointment with collaborative care management for counseling. - Advise visiting [facility] urgent care if experiencing severe depression or anxiety.  Type 1 diabetes mellitus Chronic and stable Fasting glucose this morning was 84, after lunch increased to 180. Type 1 diabetes mellitus. No recent A1c test. Currently on sliding dose of aspart and glargine 10units daily. - Order blood work including A1c. Continue current regimen Managed by endocrinology  Leg swelling (Primary) History of DVT (deep vein thrombosis) - CBC with Differential/Platelet - Comprehensive metabolic panel with GFR - TSH - Pro b natriuretic peptide (BNP)[facility]) - Urinalysis, Routine w reflex microscopic - D-Dimer, Quantitative - US  Venous Img Lower Bilateral (DVT)  Depression, recurrent (HCC) Anxiety - Ambulatory referral to Integrated Behavioral Health  Hypothyroidism, unspecified type Chronic Continue levothyroxine  Will follow-up  Anemia in stage 3 chronic kidney disease, unspecified whether stage 3a or 3b CKD (HCC) -cbc  Orders Placed This Encounter  Procedures   US  Venous Img Lower Bilateral (DVT)    Reason for Exam (SYMPTOM  OR DIAGNOSIS REQUIRED):   bilateral leg swelling, has CHF, hx of DVT/upper arm    Preferred imaging location?:   [facility]   CBC with Differential/Platelet   Comprehensive metabolic panel with GFR   TSH   Pro b natriuretic peptide (BNP)[facility])   Urinalysis, Routine w reflex microscopic   D-Dimer, Quantitative   Ambulatory referral to Integrated Behavioral Health    Referral Priority:   Routine    Referral Type:    Consultation    Referral Reason:   Specialty Services Required    Number of Visits Requested:   1  Return in about 4 weeks (around 01/18/2024) for chronic disease f/u.   The patient was advised to call back or seek an in-person evaluation if the symptoms worsen or if the condition fails to improve as anticipated.  I discussed the assessment and treatment plan with the patient. The patient was provided an opportunity to ask questions and all were answered. The patient agreed with the plan and demonstrated an understanding of the instructions.  I, Claudia Greenley, PA-C have reviewed all documentation for this visit. The documentation on 12/21/2023  for the exam, diagnosis, procedures, and orders are all accurate and complete.  Jolynn Spencer, [facility], MMS [facility] [phone] (phone) [phone] (fax)  [facility]

## 2023-12-22 ENCOUNTER — Ambulatory Visit: Payer: Self-pay | Admitting: Physician Assistant

## 2023-12-22 ENCOUNTER — Emergency Department

## 2023-12-22 ENCOUNTER — Telehealth: Payer: Self-pay | Admitting: Physician Assistant

## 2023-12-22 ENCOUNTER — Emergency Department
Admission: EM | Admit: 2023-12-22 | Discharge: 2023-12-22 | Disposition: A | Discharge status: Home / Self Care | Attending: Emergency Medicine | Admitting: Emergency Medicine

## 2023-12-22 ENCOUNTER — Other Ambulatory Visit: Payer: Self-pay

## 2023-12-22 DIAGNOSIS — I110 Hypertensive heart disease with heart failure: Secondary | ICD-10-CM | POA: Diagnosis not present

## 2023-12-22 DIAGNOSIS — I5033 Acute on chronic diastolic (congestive) heart failure: Secondary | ICD-10-CM | POA: Diagnosis not present

## 2023-12-22 DIAGNOSIS — E875 Hyperkalemia: Secondary | ICD-10-CM | POA: Diagnosis not present

## 2023-12-22 DIAGNOSIS — E1022 Type 1 diabetes mellitus with diabetic chronic kidney disease: Secondary | ICD-10-CM | POA: Diagnosis not present

## 2023-12-22 DIAGNOSIS — J90 Pleural effusion, not elsewhere classified: Secondary | ICD-10-CM | POA: Diagnosis not present

## 2023-12-22 DIAGNOSIS — N183 Chronic kidney disease, stage 3 unspecified: Secondary | ICD-10-CM | POA: Diagnosis not present

## 2023-12-22 DIAGNOSIS — M7989 Other specified soft tissue disorders: Secondary | ICD-10-CM | POA: Diagnosis not present

## 2023-12-22 DIAGNOSIS — I509 Heart failure, unspecified: Secondary | ICD-10-CM | POA: Diagnosis not present

## 2023-12-22 DIAGNOSIS — R0602 Shortness of breath: Secondary | ICD-10-CM

## 2023-12-22 DIAGNOSIS — I340 Nonrheumatic mitral (valve) insufficiency: Secondary | ICD-10-CM | POA: Diagnosis not present

## 2023-12-22 DIAGNOSIS — R918 Other nonspecific abnormal finding of lung field: Secondary | ICD-10-CM | POA: Diagnosis not present

## 2023-12-22 LAB — BASIC METABOLIC PANEL WITH GFR
Anion gap: 6 (ref 5–15)
BUN: 33 mg/dL — ABNORMAL HIGH (ref 6–20)
CO2: 25 mmol/L (ref 22–32)
Calcium: 8.1 mg/dL — ABNORMAL LOW (ref 8.9–10.3)
Chloride: 112 mmol/L — ABNORMAL HIGH (ref 98–111)
Creatinine, Ser: 1.82 mg/dL — ABNORMAL HIGH (ref 0.44–1.00)
GFR, Estimated: 37 mL/min — ABNORMAL LOW (ref >60)
Glucose, Bld: 121 mg/dL — ABNORMAL HIGH (ref 70–99)
Potassium: 5.2 mmol/L — ABNORMAL HIGH (ref 3.5–5.1)
Sodium: 143 mmol/L (ref 135–145)

## 2023-12-22 LAB — COMPREHENSIVE METABOLIC PANEL WITH GFR
ALT: 12 IU/L (ref 0–32)
AST: 12 IU/L (ref 0–40)
Albumin: 3.3 g/dL — ABNORMAL LOW (ref 3.9–4.9)
Alkaline Phosphatase: 66 IU/L (ref 44–121)
BUN/Creatinine Ratio: 20 (ref 9–23)
BUN: 35 mg/dL — ABNORMAL HIGH (ref 6–20)
Bilirubin Total: 0.3 mg/dL (ref 0.0–1.2)
CO2: 19 mmol/L — ABNORMAL LOW (ref 20–29)
Calcium: 8.1 mg/dL — ABNORMAL LOW (ref 8.7–10.2)
Chloride: 110 mmol/L — ABNORMAL HIGH (ref 96–106)
Creatinine, Ser: 1.77 mg/dL — ABNORMAL HIGH (ref 0.57–1.00)
Globulin, Total: 1.7 g/dL (ref 1.5–4.5)
Glucose: 79 mg/dL (ref 70–99)
Potassium: 4.8 mmol/L (ref 3.5–5.2)
Sodium: 145 mmol/L — ABNORMAL HIGH (ref 134–144)
Total Protein: 5 g/dL — ABNORMAL LOW (ref 6.0–8.5)
eGFR: 39 mL/min/1.73 — ABNORMAL LOW (ref >59)

## 2023-12-22 LAB — CBC WITH DIFFERENTIAL/PLATELET
Basophils Absolute: 0 x10E3/uL (ref 0.0–0.2)
Basos: 1 %
EOS (ABSOLUTE): 0.2 x10E3/uL (ref 0.0–0.4)
Eos: 4 %
Hematocrit: 31.6 % — ABNORMAL LOW (ref 34.0–46.6)
Hemoglobin: 10 g/dL — ABNORMAL LOW (ref 11.1–15.9)
Immature Grans (Abs): 0 x10E3/uL (ref 0.0–0.1)
Immature Granulocytes: 0 %
Lymphocytes Absolute: 1.8 x10E3/uL (ref 0.7–3.1)
Lymphs: 32 %
MCH: 30.5 pg (ref 26.6–33.0)
MCHC: 31.6 g/dL (ref 31.5–35.7)
MCV: 96 fL (ref 79–97)
Monocytes Absolute: 0.4 x10E3/uL (ref 0.1–0.9)
Monocytes: 8 %
Neutrophils Absolute: 3.1 x10E3/uL (ref 1.4–7.0)
Neutrophils: 55 %
Platelets: 140 x10E3/uL — ABNORMAL LOW (ref 150–450)
RBC: 3.28 x10E6/uL — ABNORMAL LOW (ref 3.77–5.28)
RDW: 16.5 % — ABNORMAL HIGH (ref 11.7–15.4)
WBC: 5.6 x10E3/uL (ref 3.4–10.8)

## 2023-12-22 LAB — CBC
HCT: 29.9 % — ABNORMAL LOW (ref 36.0–46.0)
Hemoglobin: 9.5 g/dL — ABNORMAL LOW (ref 12.0–15.0)
MCH: 30.3 pg (ref 26.0–34.0)
MCHC: 31.8 g/dL (ref 30.0–36.0)
MCV: 95.2 fL (ref 80.0–100.0)
Platelets: 128 K/uL — ABNORMAL LOW (ref 150–400)
RBC: 3.14 MIL/uL — ABNORMAL LOW (ref 3.87–5.11)
RDW: 16.8 % — ABNORMAL HIGH (ref 11.5–15.5)
WBC: 4.3 K/uL (ref 4.0–10.5)
nRBC: 0 % (ref 0.0–0.2)

## 2023-12-22 LAB — URINALYSIS, ROUTINE W REFLEX MICROSCOPIC
Bilirubin, UA: NEGATIVE
Glucose, UA: NEGATIVE
Ketones, UA: NEGATIVE
Leukocytes,UA: NEGATIVE
Nitrite, UA: NEGATIVE
Specific Gravity, UA: 1.017 (ref 1.005–1.030)
Urobilinogen, Ur: 0.2 mg/dL (ref 0.2–1.0)
pH, UA: 5.5 (ref 5.0–7.5)

## 2023-12-22 LAB — MICROSCOPIC EXAMINATION
Bacteria, UA: NONE SEEN
Casts: NONE SEEN /LPF
RBC, Urine: NONE SEEN /HPF (ref 0–2)

## 2023-12-22 LAB — D-DIMER, QUANTITATIVE: D-DIMER: 0.52 mg{FEU}/L — ABNORMAL HIGH (ref 0.00–0.49)

## 2023-12-22 LAB — TSH: TSH: 4.33 u[IU]/mL (ref 0.450–4.500)

## 2023-12-22 LAB — PRO B NATRIURETIC PEPTIDE: NT-Pro BNP: >70000 pg/mL — ABNORMAL HIGH (ref 0–130)

## 2023-12-22 LAB — TROPONIN I (HIGH SENSITIVITY)
Troponin I (High Sensitivity): 19 ng/L — ABNORMAL HIGH (ref <18)
Troponin I (High Sensitivity): 20 ng/L — ABNORMAL HIGH (ref <18)

## 2023-12-22 LAB — BRAIN NATRIURETIC PEPTIDE: B Natriuretic Peptide: 4235.7 pg/mL — ABNORMAL HIGH (ref 0.0–100.0)

## 2023-12-22 LAB — POC URINE PREG, ED: Preg Test, Ur: NEGATIVE

## 2023-12-22 MED ORDER — FUROSEMIDE 10 MG/ML IJ SOLN
60.0000 mg | Freq: Once | INTRAMUSCULAR | Status: AC
  Administered 2023-12-22: 60 mg via INTRAVENOUS
  Filled 2023-12-22: qty 8

## 2023-12-22 MED ORDER — FUROSEMIDE 20 MG PO TABS
20.0000 mg | ORAL_TABLET | Freq: Two times a day (BID) | ORAL | 1 refills | Status: DC | PRN
Start: 2023-12-22 — End: 2023-12-26

## 2023-12-22 NOTE — ED Notes (Signed)
IV team in room now

## 2023-12-22 NOTE — Progress Notes (Signed)
 Pt's pro BNP,a marker for heart failure is very high, she needs to proceed to ED for prompt evaluation!

## 2023-12-22 NOTE — Telephone Encounter (Signed)
 Ashley Camacho has spoke with patient and made aware of call.

## 2023-12-22 NOTE — ED Triage Notes (Addendum)
 Seen by PCP for leg swelling.  C/O leg swelling x 1 month.  Had blood work drawn and was told to come to ED for Eval.  Had heart failure associated with pregnancy in 2022 with low EF 30, which has returned to normal of 50-60.  Patient states c/o orthopnea x 1 month and +2  pitting edema to lower legs.  Had pneumonia in the beginning of July and was hospitalized.   AAOx3.  Skin warm and dry. NAD. No SOB/ DOE

## 2023-12-22 NOTE — ED Provider Notes (Signed)
 [facility] Provider Note    Event Date/Time   First MD Initiated Contact with Patient 12/22/23 1634     (approximate)   History   Shortness of Breath   HPI  Ashley Camacho is a 33 y.o. female who presents to the ED for evaluation of Shortness of Breath   I reviewed medical DC summary from 7/2 patient was admitted overnight due to dyspnea after a preceding admission for pneumonia.  She is history of adrenal insufficiency, type 1 diabetes, diastolic CHF w moderate mitral regurgitation, gastroparesis, CKD 3 Saw her PCP yesterday, elevated BNP so patient was directed to the ED.  Patient presents for lower extremity swelling, shortness of breath and dyspnea on exertion.  Reports that she was called and told to urgently come to the ED like it was an emergency but reports that she does not feel that bad.  Symptoms been present for a few weeks  Physical Exam   Triage Vital Signs: ED Triage Vitals  Encounter Vitals Group     BP 12/22/23 1526 (!) 136/97     Girls Systolic BP Percentile --      Girls Diastolic BP Percentile --      Boys Systolic BP Percentile --      Boys Diastolic BP Percentile --      Pulse Rate 12/22/23 1526 93     Resp 12/22/23 1526 15     Temp 12/22/23 1526 98.3 F (36.8 C)     Temp Source 12/22/23 1526 Oral     SpO2 12/22/23 1525 99 %     Weight --      Height --      Head Circumference --      Peak Flow --      Pain Score 12/22/23 1526 0     Pain Loc --      Pain Education --      Exclude from Growth Chart --     Most recent vital signs: Vitals:   12/22/23 1830 12/22/23 2045  BP: (!) 140/98 (!) 141/100  Pulse: 82 83  Resp: 18 (!) 24  Temp:    SpO2: 100% 100%    General: Awake, no distress.  CV:  Good peripheral perfusion.  Resp:  Normal effort.  Abd:  No distention.  MSK:  No deformity noted.  Edematous bilateral lower extremities Neuro:  No focal deficits appreciated. Other:     ED Results / Procedures /  Treatments   Labs (all labs ordered are listed, but only abnormal results are displayed) Labs Reviewed  BASIC METABOLIC PANEL WITH GFR - Abnormal; Notable for the following components:      Result Value   Potassium 5.2 (*)    Chloride 112 (*)    Glucose, Bld 121 (*)    BUN 33 (*)    Creatinine, Ser 1.82 (*)    Calcium  8.1 (*)    GFR, Estimated 37 (*)    All other components within normal limits  CBC - Abnormal; Notable for the following components:   RBC 3.14 (*)    Hemoglobin 9.5 (*)    HCT 29.9 (*)    RDW 16.8 (*)    Platelets 128 (*)    All other components within normal limits  BRAIN NATRIURETIC PEPTIDE - Abnormal; Notable for the following components:   B Natriuretic Peptide 4,235.7 (*)    All other components within normal limits  TROPONIN I (HIGH SENSITIVITY) - Abnormal; Notable for the following  components:   Troponin I (High Sensitivity) 19 (*)    All other components within normal limits  TROPONIN I (HIGH SENSITIVITY) - Abnormal; Notable for the following components:   Troponin I (High Sensitivity) 20 (*)    All other components within normal limits  POC URINE PREG, ED    EKG Sinus rhythm with a rate of 91 bpm.  Normal axis and intervals.  No for signs of acute ischemia.  T wave inversions to lateral leads.  RADIOLOGY 2 view CXR interpreted by me with pulmonary vascular congestion and small bilateral pleural effusions Lower extremity ultrasound interpreted by me without signs of DVT  Official radiology report(s): US  Venous Img Lower Bilateral Result Date: 12/22/2023 CLINICAL DATA:  Evaluate for DVT.  Lower extremity swelling. EXAM: BILATERAL LOWER EXTREMITY VENOUS DOPPLER ULTRASOUND TECHNIQUE: Gray-scale sonography with graded compression, as well as color Doppler and duplex ultrasound were performed to evaluate the lower extremity deep venous systems from the level of the common femoral vein and including the common femoral, femoral, profunda femoral, popliteal  and calf veins including the posterior tibial, peroneal and gastrocnemius veins when visible. The superficial great saphenous vein was also interrogated. Spectral Doppler was utilized to evaluate flow at rest and with distal augmentation maneuvers in the common femoral, femoral and popliteal veins. COMPARISON:  None Available. FINDINGS: RIGHT LOWER EXTREMITY Common Femoral Vein: No evidence of thrombus. Normal compressibility, respiratory phasicity and response to augmentation. Saphenofemoral Junction: No evidence of thrombus. Normal compressibility and flow on color Doppler imaging. Profunda Femoral Vein: No evidence of thrombus. Normal compressibility and flow on color Doppler imaging. Femoral Vein: No evidence of thrombus. Normal compressibility, respiratory phasicity and response to augmentation. Popliteal Vein: No evidence of thrombus. Normal compressibility, respiratory phasicity and response to augmentation. Calf Veins: No evidence of thrombus. Normal compressibility and flow on color Doppler imaging. Superficial Great Saphenous Vein: No evidence of thrombus. Normal compressibility. Venous Reflux:  None. Other Findings:  None. LEFT LOWER EXTREMITY Common Femoral Vein: No evidence of thrombus. Normal compressibility, respiratory phasicity and response to augmentation. Saphenofemoral Junction: No evidence of thrombus. Normal compressibility and flow on color Doppler imaging. Profunda Femoral Vein: No evidence of thrombus. Normal compressibility and flow on color Doppler imaging. Femoral Vein: No evidence of thrombus. Normal compressibility, respiratory phasicity and response to augmentation. Popliteal Vein: No evidence of thrombus. Normal compressibility, respiratory phasicity and response to augmentation. Calf Veins: No evidence of thrombus. Normal compressibility and flow on color Doppler imaging. Superficial Great Saphenous Vein: No evidence of thrombus. Normal compressibility. Venous Reflux:  None. Other  Findings:  None. IMPRESSION: No evidence of deep venous thrombosis in either lower extremity. Electronically Signed   By: Greig Pique M.D.   On: 12/22/2023 18:58   DG Chest 2 View Result Date: 12/22/2023 EXAM: 2 VIEW(S) XRAY OF THE CHEST 12/22/2023 03:50:00 PM COMPARISON: 12/03/2023 CLINICAL HISTORY: Lower extremity swelling, hx CHF. Patient states c/o orthopnea x 1 month and +2 pitting edema to lower legs. Had pneumonia in the beginning of July and was hospitalized. FINDINGS: LUNGS AND PLEURA: Small bilateral pleural effusions are more conspicuous. Some patchy atelectasis or infiltrate in the lung bases is slightly increased. Suspect mild central pulmonary vascular congestion. HEART AND MEDIASTINUM: No acute abnormality of the cardiac and mediastinal silhouettes. BONES AND SOFT TISSUES: No acute osseous abnormality. IMPRESSION: 1. Small bilateral pleural effusions, more conspicuous compared to 12/03/23. 2. Slightly increased patchy atelectasis or infiltrate in the lung bases. 3. Suspect mild central pulmonary vascular congestion. Electronically  signed by: Katheleen Faes MD 12/22/2023 03:53 PM EDT RP Workstation: [id number]    PROCEDURES and INTERVENTIONS:  .1-3 Lead EKG Interpretation  Performed by: Claudene Rover, MD Authorized by: Claudene Rover, MD     Interpretation: normal     ECG rate:  90   ECG rate assessment: normal     Rhythm: sinus rhythm     Ectopy: none     Conduction: normal     Medications  furosemide  (LASIX ) injection 60 mg (60 mg Intravenous Given 12/22/23 1806)     IMPRESSION / MDM / ASSESSMENT AND PLAN / ED COURSE  I reviewed the triage vital signs and the nursing notes.  Differential diagnosis includes, but is not limited to, ACS, PTX, PNA, muscle strain/spasm, PE, dissection, anxiety, pleural effusion, CHF exacerbation  {Patient presents with symptoms of an acute illness or injury that is potentially life-threatening.  Patient with previously reduced EF, but known  persistent diastolic dysfunction and moderate mitral regurgitation presents with subacute dyspnea on exertion and lower extremity swelling consistent with CHF.  She is not currently on any diuretic medications and would likely benefit from this.  Last saw cardiology in April.  Very mild hyperkalemia is noted on top of CKD near baseline, chronic anemia near baseline, low troponin that we will trend but she has no chest pain or ischemic EKG changes.  Elevated BNP.  Congested CXR.  Suspect all related to fluid in CHF.  We will initiate diuresis, obtain lower extremity duplex ultrasounds due to her asymmetric swelling to rule out DVT.  Patient feeling better and suitable for outpatient management.  Wrote a prescription for Lasix  and referred to CHF clinic.  Discussed ED return precautions.  Clinical Course as of 12/22/23 2121  Thu Dec 22, 2023  2118 Reassessed, feeling a little bit better.  Copious urine output.  We discussed Lasix , CHF clinic follow-up, ED return precautions [DS]    Clinical Course User Index [DS] Claudene Rover, MD     FINAL CLINICAL IMPRESSION(S) / ED DIAGNOSES   Final diagnoses:  Acute on chronic diastolic congestive heart failure (HCC)  Mitral valve insufficiency, unspecified etiology  Shortness of breath     Rx / DC Orders   ED Discharge Orders          Ordered    AMB referral to CHF clinic       Comments: Previous reduced ejection fraction since recovered.  Mild/moderate mitral regurgitation, pleural effusions, lower extremity swelling, dyspnea on exertion.  Started on as needed Lasix    12/22/23 2119    furosemide  (LASIX ) 20 MG tablet  2 times daily PRN        12/22/23 2120             Note:  This document was prepared using Dragon voice recognition software and may include unintentional dictation errors.   Claudene Rover, MD 12/22/23 2122

## 2023-12-22 NOTE — ED Notes (Signed)
 Pt ring call bell because BP cuff wasn't hooked up I fixed it and asked did she need anything else pt was good

## 2023-12-22 NOTE — Telephone Encounter (Signed)
-----   Message from [name] sent at 12/22/2023 11:51 AM EDT ----- Could you try to contact her contacts who has her permission for release of information ----- Message ----- From: Interface, [facility] Lab Results In Sent: 12/22/2023   7:37 AM EDT To: [name], PA-C

## 2023-12-22 NOTE — Addendum Note (Signed)
 Addended by: Eda Magnussen on: 12/22/2023 09:03 AM   Modules accepted: Orders

## 2023-12-22 NOTE — ED Notes (Signed)
 Medication not given due to IV access difficulty. IV team consult ordered.

## 2023-12-22 NOTE — Telephone Encounter (Signed)
 Returning your call.

## 2023-12-22 NOTE — Telephone Encounter (Signed)
 Advised patient of labs and concern for elevated BNP.  Patient will proceed to the ER

## 2023-12-26 ENCOUNTER — Ambulatory Visit (HOSPITAL_BASED_OUTPATIENT_CLINIC_OR_DEPARTMENT_OTHER): Admitting: Cardiology

## 2023-12-26 ENCOUNTER — Ambulatory Visit (HOSPITAL_COMMUNITY): Payer: Self-pay | Admitting: Cardiology

## 2023-12-26 ENCOUNTER — Other Ambulatory Visit
Admission: RE | Admit: 2023-12-26 | Discharge: 2023-12-26 | Disposition: A | Discharge status: Home / Self Care | Source: Ambulatory Visit | Attending: Cardiology | Admitting: Cardiology

## 2023-12-26 VITALS — BP 113/84 | HR 74 | Wt 130.0 lb

## 2023-12-26 DIAGNOSIS — I5042 Chronic combined systolic (congestive) and diastolic (congestive) heart failure: Secondary | ICD-10-CM | POA: Diagnosis not present

## 2023-12-26 DIAGNOSIS — I429 Cardiomyopathy, unspecified: Secondary | ICD-10-CM

## 2023-12-26 LAB — COMPREHENSIVE METABOLIC PANEL WITH GFR
ALT: 16 U/L (ref 0–44)
AST: 20 U/L (ref 15–41)
Albumin: 3.2 g/dL — ABNORMAL LOW (ref 3.5–5.0)
Alkaline Phosphatase: 85 U/L (ref 38–126)
Anion gap: 14 (ref 5–15)
BUN: 44 mg/dL — ABNORMAL HIGH (ref 6–20)
CO2: 25 mmol/L (ref 22–32)
Calcium: 8.5 mg/dL — ABNORMAL LOW (ref 8.9–10.3)
Chloride: 104 mmol/L (ref 98–111)
Creatinine, Ser: 2.29 mg/dL — ABNORMAL HIGH (ref 0.44–1.00)
GFR, Estimated: 28 mL/min — ABNORMAL LOW (ref >60)
Glucose, Bld: 219 mg/dL — ABNORMAL HIGH (ref 70–99)
Potassium: 5.1 mmol/L (ref 3.5–5.1)
Sodium: 143 mmol/L (ref 135–145)
Total Bilirubin: 0.7 mg/dL (ref 0.0–1.2)
Total Protein: 6 g/dL — ABNORMAL LOW (ref 6.5–8.1)

## 2023-12-26 LAB — CBC
HCT: 35.3 % — ABNORMAL LOW (ref 36.0–46.0)
Hemoglobin: 11.1 g/dL — ABNORMAL LOW (ref 12.0–15.0)
MCH: 30 pg (ref 26.0–34.0)
MCHC: 31.4 g/dL (ref 30.0–36.0)
MCV: 95.4 fL (ref 80.0–100.0)
Platelets: 156 K/uL (ref 150–400)
RBC: 3.7 MIL/uL — ABNORMAL LOW (ref 3.87–5.11)
RDW: 16.6 % — ABNORMAL HIGH (ref 11.5–15.5)
WBC: 6.7 K/uL (ref 4.0–10.5)
nRBC: 0 % (ref 0.0–0.2)

## 2023-12-26 LAB — LIPID PANEL
Cholesterol: 171 mg/dL (ref 0–200)
HDL: 65 mg/dL (ref >40)
LDL Cholesterol: 84 mg/dL (ref 0–99)
Total CHOL/HDL Ratio: 2.6 ratio
Triglycerides: 112 mg/dL (ref <150)
VLDL: 22 mg/dL (ref 0–40)

## 2023-12-26 LAB — TSH: TSH: 4.728 u[IU]/mL — ABNORMAL HIGH (ref 0.350–4.500)

## 2023-12-26 LAB — BRAIN NATRIURETIC PEPTIDE: B Natriuretic Peptide: >4500 pg/mL — ABNORMAL HIGH (ref 0.0–100.0)

## 2023-12-26 MED ORDER — SPIRONOLACTONE 25 MG PO TABS
12.5000 mg | ORAL_TABLET | Freq: Every day | ORAL | 3 refills | Status: DC
Start: 2023-12-26 — End: 2024-02-28

## 2023-12-26 MED ORDER — FUROSEMIDE 20 MG PO TABS
40.0000 mg | ORAL_TABLET | Freq: Every day | ORAL | 5 refills | Status: DC
Start: 2023-12-26 — End: 2024-02-09

## 2023-12-26 NOTE — Patient Instructions (Addendum)
 Medication Changes:  INCREASE LASIX  TO 40 MG ONCE DAILY   Lab Work:  Go over to the [facility]. Go pass the gift shop and have your blood work completed.  We will only call you if the results are abnormal or if the provider would like to make medication changes.  No news is good news.   Testing/Procedures:  Your provider has ordered a Cardiac PET stress test and a Cardiac MRI for Cardiomyopathy. Someone will be reaching out to shortly regarding getting these scheduled. Please give it at least 1-2 weeks.   Special Instructions // Education:  PLEASE WEAR COMPRESSIONS SOCKS DAILY AND REMOVE THEM AT BEDTIME.   Follow-Up in: 3-4 WEEKS WITH DR. ROLAN   Thank you for choosing [facility].    At the [facility], you and your health needs are our priority. We have a designated team specialized in the treatment of Heart Failure. This Care Team includes your primary Heart Failure Specialized Cardiologist (physician), Advanced Practice Providers (APPs- Physician Assistants and Nurse Practitioners), and Pharmacist who all work together to provide you with the care you need, when you need it.   You may see any of the following providers on your designated Care Team at your next follow up:  Dr. Toribio Fuel Dr. Ezra ROLAN Dr. Ria Commander Dr. Morene Brownie Ellouise Class, FNP Jaun Bash, RPH-CPP  Please be sure to bring in all your medications bottles to every appointment.   Need to Contact Us :  If you have any questions or concerns before your next appointment please send us  a message through [place] or call our office at [phone].    TO LEAVE A MESSAGE FOR THE NURSE SELECT OPTION 2, PLEASE LEAVE A MESSAGE INCLUDING: YOUR NAME DATE OF BIRTH CALL BACK NUMBER REASON FOR CALL**this is important as we prioritize the call backs  YOU WILL RECEIVE A CALL BACK THE SAME DAY AS LONG AS YOU CALL BEFORE 4:00 PM

## 2023-12-26 NOTE — Progress Notes (Signed)
 PCP: Ostwalt, [name], PA-C HF Cardiology: Dr. Rolan  Chief complaint: CHF  33 y.o. with history of type 1 diabetes, CKD stage 3/diabetic nephropathy, diabetic gastroparesis, adrenal insufficiency, and CHF was referred by Dr. Claudene in the [facility] ER for evaluation of CHF.  She has a rather complex history.  Echo in 7/22 after the birth of her second daughter showed EF 40-45%.  This had declined to 25% by 2/23, ?peripartum CMP.  Cath in 2/23 at [facility] showed no significant CAD.  She had AKI in 3/23 requiring transient dialysis, possible acute interstitial nephritis.  She also had Enterococcal bacteremia that admission in 3/23. She has numerous diabetic complications including gastroparesis, nephropathy, and neuropathy.    She was hospitalized at [facility] in 6/25 for possible PNA.  She was seen in the ER 12/22/23 with dyspnea and LE edema.  She says the edema had been present since 6/25 admission and she think she got too much IVF in the hospital. LE US  showed not DVT.  CXR showed vascular congestion.  BNP was severely elevated.  Interestingly, patient had had an echo in 6/25 at [facility] that I reviewed; this was fairly normal with EF 55-60%, GLS -23.4%, normal diastolic function, RV normal, mild-moderate MR, IVC normal.  Echo did not match clinical picture.  Patient was started on Lasix  20 mg daily when she was sent home from the ER.   She reports dyspnea walking in the heat.  She is short of breath walking up stairs or carrying her 66-year-old. Edema is still present but better now that she is taking Lasix . She reports orthopnea, no PND.  No chest pain or palpitations. No lightheadedness.   ECG (personally reviewed, 7/31): NSR, poor RWP  Labs (7/25): HS-TnI 19 => 20, BNP 4236, K 5.2, creatinine 1.82  PMH: 1. Type 1 diabetes with h/o DKA.  2. Diabetic gastroparesis 3. Adrenal insufficiency: She is on hydrocortisone  4. CKD stage 3: Diabetic nephropathy.  5. Chronic HF with recovered EF: H/o nonischemic  cardiomyopathy.  - Echo (7/22): EF 40-45% => ?peri-partum cardiomyopathy.  - Echo (2/23): EF 25% - LHC (2/23, [facility]): no significant CAD.  - Echo (3/23): EF 20% - Echo (10/23): EF 30-35% ([facility]) - Echo (6/25): EF 55-60%, GLS -23.4%, normal diastolic function, RV normal, mild-moderate MR, IVC normal 6. Diabetic neuropathy.  7. PEA arrest after EGD in 3/23.  8. AKI in 3/23 from possible acute interstitial nephritis, required transient HD.  9. Enterococcal bacteremia in 3/23.  10. RUE DVT in 2022.   SH: Lives with roommate in [place], 2 daughters, nonsmoker, no ETOH.  Not working.   FH: Father with type 1 diabetes, CAD with stent around age 67, died with cardiac arrest around age 66. Sister with SVT.   ROS: All systems reviewed and negative except as per HPI.   Current Outpatient Medications  Medication Sig Dispense Refill   acetaminophen  (TYLENOL ) 500 MG tablet Take 2 tablets (1,000 mg total) by mouth every 6 (six) hours as needed for headache, fever or moderate pain. 30 tablet 3   BAQSIMI ONE PACK 3 MG/DOSE POWD Place 3 mg into the nose as needed.     BD PEN NEEDLE MINI ULTRAFINE 31G X 5 MM MISC by Other route.     Continuous Glucose Sensor (DEXCOM G7 SENSOR) MISC SMARTSIG:1 Each Every 10 Days     furosemide  (LASIX ) 20 MG tablet Take 2 tablets (40 mg total) by mouth daily. 60 tablet 5   hydrocortisone  (CORTEF ) 10 MG tablet Take 2  tablets (20 mg total) by mouth daily with breakfast AND 1 tablet (10 mg total) daily with supper. 90 tablet 0   insulin  aspart (NOVOLOG ) 100 UNIT/ML FlexPen Inject 2 Units into the skin 3 (three) times daily with meals. If eating and Blood Glucose (BG) 80 or higher inject 2 units for meal coverage and add correction dose per scale. If not eating, correction dose only. BG <150= 0 unit; BG 150-200= 1 unit; BG 201-250= 3 unit; BG 251-300= 5 unit; BG 301-350= 7 unit; BG 351-400= 9 unit; BG >400= 11 unit and Call Primary Care.     insulin  glargine (LANTUS ) 100 UNIT/ML  Solostar Pen Inject 10 Units into the skin daily.     Insulin  Pen Needle (PEN NEEDLES) 31G X 6 MM MISC PEN NEEDLES 31G X 6 MM     levothyroxine  (SYNTHROID ) 75 MCG tablet TAKE 1 TABLET (75 MCG TOTAL) BY MOUTH DAILY AT 6 (SIX) AM. 90 tablet 0   metoprolol  tartrate (LOPRESSOR ) 25 MG tablet Take 1 tablet (25 mg total) by mouth 2 (two) times daily. 60 tablet 0   Pancrelipase , Lip-Prot-Amyl, 24000-76000 units CPEP Take 1 capsule (24,000 Units total) by mouth 3 (three) times daily before meals. 270 capsule 0   spironolactone  (ALDACTONE ) 25 MG tablet Take 0.5 tablets (12.5 mg total) by mouth daily. 45 tablet 3   Blood Pressure Monitoring (BLOOD PRESSURE CUFF) MISC Use as directed (Patient not taking: Reported on 12/26/2023) 1 each 0   cholecalciferol  (VITAMIN D3) 25 MCG (1000 UNIT) tablet  (Patient not taking: Reported on 12/26/2023)     saccharomyces boulardii (FLORASTOR) 250 MG capsule Take 250 mg by mouth 2 (two) times daily. (Patient not taking: Reported on 12/26/2023)     No current facility-administered medications for this visit.   BP 113/84   Pulse 74   Wt 130 lb (59 kg)   LMP 11/28/2023 (Approximate)   SpO2 96%   BMI 24.56 kg/m  General: NAD Neck: JVP 8-9 cm, no thyromegaly or thyroid  nodule.  Lungs: Clear to auscultation bilaterally with normal respiratory effort. CV: Nondisplaced PMI.  Heart regular S1/S2, no S3/S4, no murmur.  2+ edema to knees.  No carotid bruit.  Normal pedal pulses.  Abdomen: Soft, nontender, no hepatosplenomegaly, no distention.  Skin: Intact without lesions or rashes.  Neurologic: Alert and oriented x 3.  Psych: Normal affect. Extremities: No clubbing or cyanosis.  HEENT: Normal.   Assessment/Plan: 1. Chronic HF with improved EF: Patient has history of nonischemic cardiomyopathy that was primarily followed at [facility] in the past.  There was concern that this could be a peri-partum cardiomyopathy.  Echo in 7/22 after the birth of her second daughter showed EF 40-45%.   This had declined to 25% by 2/23, ?peripartum CMP.  Cath in 2/23 at [facility] showed no significant CAD.  However, echo in 6/25 at [facility] was reviewed and noted to be relatively normal except for mild to moderate MR => EF 55-60%, GLS -23.4%, normal diastolic function, RV normal, mild-moderate MR, IVC normal.  Despite the unremarkable echo, she was recently seen in the ER with a very high BNP and she is significantly volume overloaded on exam with NYHA class III symptoms.  GDMT will be limited by CKD and upper normal K.  - Increase Lasix  to 40 mg daily with volume overload.  - Start spironolactone  12.5 daily. Will need to follow K very closely.  Check BMET/BNP today, BMET again at end of the week.  - She is not a  candidate for SGLT2 inhibitor with type 1 diabetes.  - I will arrange for cardiac MRI to reassess LV/RV function and assess for infiltrative disease.  - She has a strong family history of premature CAD (father) and type 1 diabetes. Given exertional symptoms, I will arrange for cardiac PET to assess for ischemia.  She had a cath in 2/23 without significant CAD.  - Prescription for graded compression stockings.  2. Hyperlipidemia: Check lipids today, given type 1 diabetes, would have low threshold to start statin. 3. CKD stage 3: Diabetic nephropathy.  - BMET today.  4. Type 1 diabetes: Continue insulin  per PCP.   Followup with me in 3-4 weeks.  I spent 56 minutes reviewing records, interviewing/examining patient, and managing orders.   Ezra Shuck 12/26/2023

## 2023-12-28 ENCOUNTER — Other Ambulatory Visit: Payer: Self-pay

## 2023-12-28 DIAGNOSIS — I5042 Chronic combined systolic (congestive) and diastolic (congestive) heart failure: Secondary | ICD-10-CM

## 2023-12-28 DIAGNOSIS — I429 Cardiomyopathy, unspecified: Secondary | ICD-10-CM

## 2023-12-28 NOTE — Progress Notes (Signed)
 Orders placed for testing per dr mclean

## 2023-12-30 ENCOUNTER — Encounter (HOSPITAL_COMMUNITY): Payer: Self-pay

## 2024-01-02 ENCOUNTER — Emergency Department

## 2024-01-02 ENCOUNTER — Inpatient Hospital Stay
Admission: EM | Admit: 2024-01-02 | Discharge: 2024-01-07 | DRG: 291 | Disposition: A | Discharge status: Home / Self Care | Attending: Internal Medicine | Admitting: Internal Medicine

## 2024-01-02 ENCOUNTER — Telehealth: Payer: Self-pay

## 2024-01-02 ENCOUNTER — Other Ambulatory Visit: Payer: Self-pay

## 2024-01-02 DIAGNOSIS — I110 Hypertensive heart disease with heart failure: Secondary | ICD-10-CM | POA: Diagnosis not present

## 2024-01-02 DIAGNOSIS — I340 Nonrheumatic mitral (valve) insufficiency: Secondary | ICD-10-CM | POA: Diagnosis present

## 2024-01-02 DIAGNOSIS — Z801 Family history of malignant neoplasm of trachea, bronchus and lung: Secondary | ICD-10-CM

## 2024-01-02 DIAGNOSIS — I509 Heart failure, unspecified: Secondary | ICD-10-CM | POA: Diagnosis not present

## 2024-01-02 DIAGNOSIS — Z79899 Other long term (current) drug therapy: Secondary | ICD-10-CM

## 2024-01-02 DIAGNOSIS — I428 Other cardiomyopathies: Secondary | ICD-10-CM | POA: Diagnosis present

## 2024-01-02 DIAGNOSIS — E10649 Type 1 diabetes mellitus with hypoglycemia without coma: Secondary | ICD-10-CM | POA: Diagnosis not present

## 2024-01-02 DIAGNOSIS — R079 Chest pain, unspecified: Secondary | ICD-10-CM | POA: Diagnosis not present

## 2024-01-02 DIAGNOSIS — I872 Venous insufficiency (chronic) (peripheral): Secondary | ICD-10-CM

## 2024-01-02 DIAGNOSIS — N184 Chronic kidney disease, stage 4 (severe): Secondary | ICD-10-CM | POA: Diagnosis present

## 2024-01-02 DIAGNOSIS — E1022 Type 1 diabetes mellitus with diabetic chronic kidney disease: Secondary | ICD-10-CM | POA: Diagnosis present

## 2024-01-02 DIAGNOSIS — F419 Anxiety disorder, unspecified: Secondary | ICD-10-CM | POA: Diagnosis present

## 2024-01-02 DIAGNOSIS — Z5982 Transportation insecurity: Secondary | ICD-10-CM

## 2024-01-02 DIAGNOSIS — D631 Anemia in chronic kidney disease: Secondary | ICD-10-CM | POA: Diagnosis present

## 2024-01-02 DIAGNOSIS — Z5986 Financial insecurity: Secondary | ICD-10-CM

## 2024-01-02 DIAGNOSIS — I5023 Acute on chronic systolic (congestive) heart failure: Secondary | ICD-10-CM | POA: Diagnosis not present

## 2024-01-02 DIAGNOSIS — E039 Hypothyroidism, unspecified: Secondary | ICD-10-CM | POA: Diagnosis present

## 2024-01-02 DIAGNOSIS — I2489 Other forms of acute ischemic heart disease: Secondary | ICD-10-CM | POA: Diagnosis present

## 2024-01-02 DIAGNOSIS — E8770 Fluid overload, unspecified: Secondary | ICD-10-CM | POA: Diagnosis present

## 2024-01-02 DIAGNOSIS — Z803 Family history of malignant neoplasm of breast: Secondary | ICD-10-CM

## 2024-01-02 DIAGNOSIS — N1832 Chronic kidney disease, stage 3b: Secondary | ICD-10-CM | POA: Diagnosis present

## 2024-01-02 DIAGNOSIS — Z79890 Hormone replacement therapy (postmenopausal): Secondary | ICD-10-CM

## 2024-01-02 DIAGNOSIS — I130 Hypertensive heart and chronic kidney disease with heart failure and stage 1 through stage 4 chronic kidney disease, or unspecified chronic kidney disease: Principal | ICD-10-CM | POA: Diagnosis present

## 2024-01-02 DIAGNOSIS — N179 Acute kidney failure, unspecified: Secondary | ICD-10-CM | POA: Diagnosis present

## 2024-01-02 DIAGNOSIS — E271 Primary adrenocortical insufficiency: Secondary | ICD-10-CM | POA: Diagnosis present

## 2024-01-02 DIAGNOSIS — R0602 Shortness of breath: Secondary | ICD-10-CM | POA: Diagnosis not present

## 2024-01-02 DIAGNOSIS — Z833 Family history of diabetes mellitus: Secondary | ICD-10-CM

## 2024-01-02 DIAGNOSIS — K3184 Gastroparesis: Secondary | ICD-10-CM | POA: Diagnosis present

## 2024-01-02 DIAGNOSIS — D638 Anemia in other chronic diseases classified elsewhere: Secondary | ICD-10-CM | POA: Diagnosis present

## 2024-01-02 DIAGNOSIS — E109 Type 1 diabetes mellitus without complications: Secondary | ICD-10-CM | POA: Diagnosis present

## 2024-01-02 DIAGNOSIS — E876 Hypokalemia: Secondary | ICD-10-CM | POA: Diagnosis present

## 2024-01-02 DIAGNOSIS — Z8249 Family history of ischemic heart disease and other diseases of the circulatory system: Secondary | ICD-10-CM

## 2024-01-02 DIAGNOSIS — R0989 Other specified symptoms and signs involving the circulatory and respiratory systems: Secondary | ICD-10-CM | POA: Diagnosis not present

## 2024-01-02 DIAGNOSIS — E1043 Type 1 diabetes mellitus with diabetic autonomic (poly)neuropathy: Secondary | ICD-10-CM | POA: Diagnosis present

## 2024-01-02 DIAGNOSIS — Z794 Long term (current) use of insulin: Secondary | ICD-10-CM

## 2024-01-02 DIAGNOSIS — Z86718 Personal history of other venous thrombosis and embolism: Secondary | ICD-10-CM

## 2024-01-02 DIAGNOSIS — Z555 Less than a high school diploma: Secondary | ICD-10-CM

## 2024-01-02 DIAGNOSIS — E785 Hyperlipidemia, unspecified: Secondary | ICD-10-CM | POA: Diagnosis present

## 2024-01-02 LAB — CBC
HCT: 37 % (ref 36.0–46.0)
Hemoglobin: 11.8 g/dL — ABNORMAL LOW (ref 12.0–15.0)
MCH: 30.3 pg (ref 26.0–34.0)
MCHC: 31.9 g/dL (ref 30.0–36.0)
MCV: 95.1 fL (ref 80.0–100.0)
Platelets: 174 K/uL (ref 150–400)
RBC: 3.89 MIL/uL (ref 3.87–5.11)
RDW: 17 % — ABNORMAL HIGH (ref 11.5–15.5)
WBC: 9.9 K/uL (ref 4.0–10.5)
nRBC: 0 % (ref 0.0–0.2)

## 2024-01-02 LAB — URINALYSIS, ROUTINE W REFLEX MICROSCOPIC
Bilirubin Urine: NEGATIVE
Glucose, UA: 50 mg/dL — AB
Ketones, ur: NEGATIVE mg/dL
Leukocytes,Ua: NEGATIVE
Nitrite: NEGATIVE
Protein, ur: 30 mg/dL — AB
Specific Gravity, Urine: 1.006 (ref 1.005–1.030)
pH: 6 (ref 5.0–8.0)

## 2024-01-02 LAB — BASIC METABOLIC PANEL WITH GFR
Anion gap: 11 (ref 5–15)
BUN: 59 mg/dL — ABNORMAL HIGH (ref 6–20)
CO2: 26 mmol/L (ref 22–32)
Calcium: 8.3 mg/dL — ABNORMAL LOW (ref 8.9–10.3)
Chloride: 108 mmol/L (ref 98–111)
Creatinine, Ser: 1.9 mg/dL — ABNORMAL HIGH (ref 0.44–1.00)
GFR, Estimated: 36 mL/min — ABNORMAL LOW (ref >60)
Glucose, Bld: 98 mg/dL (ref 70–99)
Potassium: 4 mmol/L (ref 3.5–5.1)
Sodium: 145 mmol/L (ref 135–145)

## 2024-01-02 LAB — BRAIN NATRIURETIC PEPTIDE: B Natriuretic Peptide: >4500 pg/mL — ABNORMAL HIGH (ref 0.0–100.0)

## 2024-01-02 LAB — TROPONIN I (HIGH SENSITIVITY)
Troponin I (High Sensitivity): 29 ng/L — ABNORMAL HIGH (ref <18)
Troponin I (High Sensitivity): 28 ng/L — ABNORMAL HIGH (ref <18)

## 2024-01-02 LAB — CBG MONITORING, ED
Glucose-Capillary: 69 mg/dL — ABNORMAL LOW (ref 70–99)
Glucose-Capillary: 70 mg/dL (ref 70–99)

## 2024-01-02 MED ORDER — SPIRONOLACTONE 12.5 MG HALF TABLET
12.5000 mg | ORAL_TABLET | Freq: Every day | ORAL | Status: DC
  Administered 2024-01-03 (×2): 12.5 mg via ORAL
  Filled 2024-01-02: qty 1

## 2024-01-02 MED ORDER — ACETAMINOPHEN 325 MG PO TABS: 650.0000 mg | ORAL_TABLET | Freq: Four times a day (QID) | ORAL | Status: DC | PRN

## 2024-01-02 MED ORDER — INSULIN ASPART 100 UNIT/ML IJ SOLN
0.0000 [IU] | Freq: Three times a day (TID) | INTRAMUSCULAR | Status: DC
  Administered 2024-01-03 (×2): 3 [IU] via SUBCUTANEOUS
  Filled 2024-01-02: qty 1

## 2024-01-02 MED ORDER — INSULIN ASPART 100 UNIT/ML IJ SOLN
0.0000 [IU] | Freq: Every day | INTRAMUSCULAR | Status: DC
  Administered 2024-01-03 (×2): 3 [IU] via SUBCUTANEOUS
  Filled 2024-01-02: qty 1

## 2024-01-02 MED ORDER — FUROSEMIDE 10 MG/ML IJ SOLN
40.0000 mg | Freq: Two times a day (BID) | INTRAMUSCULAR | Status: DC
  Filled 2024-01-02: qty 4

## 2024-01-02 MED ORDER — ENOXAPARIN SODIUM 40 MG/0.4ML IJ SOSY
40.0000 mg | PREFILLED_SYRINGE | INTRAMUSCULAR | Status: DC
  Administered 2024-01-03 – 2024-01-05 (×3): 40 mg via SUBCUTANEOUS
  Filled 2024-01-02 (×3): qty 0.4

## 2024-01-02 MED ORDER — METOPROLOL TARTRATE 25 MG PO TABS
25.0000 mg | ORAL_TABLET | Freq: Two times a day (BID) | ORAL | Status: DC
  Administered 2024-01-03 (×2): 25 mg via ORAL
  Filled 2024-01-02: qty 1

## 2024-01-02 MED ORDER — ACETAMINOPHEN 650 MG RE SUPP: 650.0000 mg | Freq: Four times a day (QID) | RECTAL | Status: DC | PRN

## 2024-01-02 MED ORDER — HYDROCORTISONE 10 MG PO TABS
20.0000 mg | ORAL_TABLET | Freq: Every day | ORAL | Status: DC
  Administered 2024-01-03 – 2024-01-07 (×6): 20 mg via ORAL
  Filled 2024-01-02 (×5): qty 2

## 2024-01-02 MED ORDER — HYDROCORTISONE 10 MG PO TABS
10.0000 mg | ORAL_TABLET | Freq: Every day | ORAL | Status: DC
  Administered 2024-01-03 – 2024-01-06 (×6): 10 mg via ORAL
  Filled 2024-01-02 (×6): qty 1

## 2024-01-02 MED ORDER — ONDANSETRON HCL 4 MG/2ML IJ SOLN: 4.0000 mg | Freq: Four times a day (QID) | INTRAMUSCULAR | Status: DC | PRN

## 2024-01-02 MED ORDER — INSULIN GLARGINE-YFGN 100 UNIT/ML ~~LOC~~ SOLN
10.0000 [IU] | Freq: Every day | SUBCUTANEOUS | Status: DC
  Administered 2024-01-03 (×2): 10 [IU] via SUBCUTANEOUS
  Filled 2024-01-02: qty 0.1

## 2024-01-02 MED ORDER — ONDANSETRON HCL 4 MG PO TABS: 4.0000 mg | ORAL_TABLET | Freq: Four times a day (QID) | ORAL | Status: DC | PRN

## 2024-01-02 MED ORDER — HYDROCODONE-ACETAMINOPHEN 5-325 MG PO TABS: 1.0000 | ORAL_TABLET | ORAL | Status: DC | PRN

## 2024-01-02 MED ORDER — PANCRELIPASE (LIP-PROT-AMYL) 12000-38000 UNITS PO CPEP
24000.0000 [IU] | ORAL_CAPSULE | Freq: Three times a day (TID) | ORAL | Status: DC
  Administered 2024-01-03 – 2024-01-07 (×18): 24000 [IU] via ORAL
  Filled 2024-01-02 (×17): qty 2

## 2024-01-02 MED ORDER — MORPHINE SULFATE (PF) 2 MG/ML IV SOLN: 2.0000 mg | INTRAVENOUS | Status: DC | PRN

## 2024-01-02 MED ORDER — LEVOTHYROXINE SODIUM 50 MCG PO TABS
75.0000 ug | ORAL_TABLET | Freq: Every day | ORAL | Status: DC
  Administered 2024-01-03 – 2024-01-07 (×6): 75 ug via ORAL
  Filled 2024-01-02 (×4): qty 1

## 2024-01-02 MED ORDER — FUROSEMIDE 10 MG/ML IJ SOLN
60.0000 mg | Freq: Once | INTRAMUSCULAR | Status: AC
  Administered 2024-01-02 (×2): 60 mg via INTRAVENOUS
  Filled 2024-01-02: qty 8

## 2024-01-02 NOTE — Telephone Encounter (Signed)
 Pt called stating that she is having mid chest pain that is radiating up her left neck and jaw. She states that she is also experiencing swelling from her abdomen down to her feet despite the medication changes last week in office. Advised that she needs to be evaluated in the ED for chest pain.

## 2024-01-02 NOTE — Assessment & Plan Note (Signed)
 -  Renal function  at baseline

## 2024-01-02 NOTE — Assessment & Plan Note (Addendum)
 Nonischemic cardiomyopathy/peri-partum cardiomyopathy EF 40-45%(2022)-->25%(2023)-->50-55%(10/2023).  GDMT will be limited by CKD and upper normal K.  IV Lasix  Continue metoprolol  Continue recently initiated spironolactone  12.5 daily Not a candidate for SGLT2 inhibitor with type 1 diabetes(per cardiology note) Pending cardiac MRI and to infiltrative disease.  Pending cardiac PET to assess for ischemia(cath in 2/23 without significant CAD).  Continue prescribed graded compression stockings.  [facility] cardiology consulted for additional recommendations Daily weights with intake and output monitoring

## 2024-01-02 NOTE — ED Provider Notes (Signed)
 [facility] Provider Note    Event Date/Time   First MD Initiated Contact with Patient 01/02/24 1917     (approximate)   History   No chief complaint on file.   HPI  Ashley Camacho is a 33 year old female with history of T1DM, CKD, CHF presenting to the emergency department for evaluation of swelling and chest pain.  Patient reports she has had longstanding issues with leg swelling.  Saw her heart failure team as below, but reports that despite changing her medication she has had worsening swelling going up into her abdomen.  Earlier today she had an episode of chest pain described as a tightness in the center of her chest, now resolved.  Does report exertional dyspnea and dyspnea when laying flat.  I reviewed her heart failure visit from 12/26/2023.  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  Her Lasix  was increased and she was started on spironolactone .    Physical Exam   Triage Vital Signs: ED Triage Vitals  Encounter Vitals Group     BP 01/02/24 1517 (!) 148/94     Girls Systolic BP Percentile --      Girls Diastolic BP Percentile --      Boys Systolic BP Percentile --      Boys Diastolic BP Percentile --      Pulse Rate 01/02/24 1517 78     Resp 01/02/24 1517 17     Temp 01/02/24 1517 98.4 F (36.9 C)     Temp src --      SpO2 01/02/24 1517 100 %     Weight 01/02/24 1518 130 lb (59 kg)     Height 01/02/24 1518 5' 1 (1.549 m)     Head Circumference --      Peak Flow --      Pain Score 01/02/24 1517 3     Pain Loc --      Pain Education --      Exclude from Growth Chart --     Most recent vital signs: Vitals:   01/02/24 2230 01/02/24 2231  BP:  136/82  Pulse:  88  Resp: 16 16  Temp:    SpO2:  100%     General: Awake, interactive CV:  Regular rate, good  peripheral perfusion.  Resp:  Unlabored respirations, bibasilar crackles noted Abd:  Nondistended Neuro:  Symmetric facial movement, fluid speech MSK:  Bilateral lower extremity edema   ED Results / Procedures / Treatments   Labs (all labs ordered are listed, but only abnormal results are displayed) Labs Reviewed  BASIC METABOLIC PANEL WITH GFR - Abnormal; Notable for the following components:      Result Value   BUN 59 (*)    Creatinine, Ser 1.90 (*)    Calcium  8.3 (*)    GFR, Estimated 36 (*)    All other components within normal limits  BRAIN NATRIURETIC PEPTIDE - Abnormal; Notable for the following components:   B Natriuretic Peptide >4,500.0 (*)    All other components within normal limits  CBC - Abnormal; Notable for the following components:   Hemoglobin 11.8 (*)    RDW 17.0 (*)    All other components within normal limits  CBG MONITORING, ED - Abnormal; Notable for the following components:   Glucose-Capillary 69 (*)  All other components within normal limits  TROPONIN I (HIGH SENSITIVITY) - Abnormal; Notable for the following components:   Troponin I (High Sensitivity) 29 (*)    All other components within normal limits  TROPONIN I (HIGH SENSITIVITY) - Abnormal; Notable for the following components:   Troponin I (High Sensitivity) 28 (*)    All other components within normal limits  URINALYSIS, ROUTINE W REFLEX MICROSCOPIC  CBG MONITORING, ED  POC URINE PREG, ED     EKG EKG independently reviewed and interpreted by myself demonstrates:  EKG demonstrates normal sinus rhythm rate of 77, PR 156, QRS 80, QTc 441, no acute ST changes  RADIOLOGY Imaging independently reviewed and interpreted by myself demonstrates:  CXR without focal consolidation  Formal Radiology Read:  DG Chest 2 View Result Date: 01/02/2024 CLINICAL DATA:  Chest pain and shortness of breath with leg swelling. EXAM: CHEST - 2 VIEW COMPARISON:  December 22, 2023 FINDINGS: The heart size and  mediastinal contours are within normal limits. Low lung volumes are noted. Both lungs are clear. The visualized skeletal structures are unremarkable. IMPRESSION: No active cardiopulmonary disease. Electronically Signed   By: Suzen Dials M.D.   On: 01/02/2024 16:50    PROCEDURES:  Critical Care performed: Yes, see critical care procedure note(s)  CRITICAL CARE Performed by: Nilsa Dade   Total critical care time: 31 minutes  Critical care time was exclusive of separately billable procedures and treating other patients.  Critical care was necessary to treat or prevent imminent or life-threatening deterioration.  Critical care was time spent personally by me on the following activities: development of treatment plan with patient and/or surrogate as well as nursing, discussions with consultants, evaluation of patient's response to treatment, examination of patient, obtaining history from patient or surrogate, ordering and performing treatments and interventions, ordering and review of laboratory studies, ordering and review of radiographic studies, pulse oximetry and re-evaluation of patient's condition.   Procedures   MEDICATIONS ORDERED IN ED: Medications  furosemide  (LASIX ) injection 60 mg (60 mg Intravenous Given 01/02/24 2103)     IMPRESSION / MDM / ASSESSMENT AND PLAN / ED COURSE  I reviewed the triage vital signs and the nursing notes.  Differential diagnosis includes, but is not limited to, CHF exacerbation, ACS, pneumonia, much lower suspicion PE or dissection given resolved chest pain  Patient's presentation is most consistent with acute presentation with potential threat to life or bodily function.  33 year old female presenting to the emergency department for evaluation of worsening lower extremity swelling, new onset chest pain that is now resolved.  Stable vitals on presentation.  Labs with stable creatinine elevation, significantly elevated BNP at greater than 4500.   Troponin minimally elevated, stable on repeat which I suspect is likely related to her CHF.  She was seen in the heart failure clinic and had medication adjustments, but unfortunately has continued to have worsening symptoms.  With this, did think she is appropriate for admission for further evaluation of her heart failure.  Will try increased dose of Lasix  at 60 as patient reports she has had limited urine output with her current dose of Lasix .  Will reach out to hospitalist team to discuss admission.  Case discussed with Dr. Cleatus.  She will evaluate for anticipated admission.      FINAL CLINICAL IMPRESSION(S) / ED DIAGNOSES   Final diagnoses:  Acute on chronic congestive heart failure, unspecified heart failure type (HCC)     Rx / DC Orders   ED Discharge Orders  None        Note:  This document was prepared using Dragon voice recognition software and may include unintentional dictation errors.   Levander Slate, MD 01/02/24 2256

## 2024-01-02 NOTE — ED Notes (Signed)
 [employer] changed due to lab value

## 2024-01-02 NOTE — ED Notes (Signed)
 This EDT gave the pt a lunch tray (includes [place] sandwich, apple sauce, potato chips, and graham, crackers).

## 2024-01-02 NOTE — Assessment & Plan Note (Signed)
 Elevated troponin Chest pain resolved spontaneously Troponin 28-continue to trend Negative cath 07/17/2023 Cardiac PET being arranged by primary cardiologist Cardiology consulted

## 2024-01-02 NOTE — ED Triage Notes (Signed)
 Pt comes in with complaints of chest pain and leg swelling. Pt states that the chest pain started this morning, but her leg swelling is chronic. Pt complains of no chest pain at this time, but complains of bilateral leg pain 3/10.

## 2024-01-02 NOTE — Assessment & Plan Note (Signed)
 Hemoglobin at baseline

## 2024-01-02 NOTE — Assessment & Plan Note (Signed)
 Continue levothyroxine 

## 2024-01-02 NOTE — H&P (Signed)
 History and Physical    Patient: Ashley Camacho FMW:[id number] DOB: [birth year] DOA: 01/02/2024 DOS: the patient was seen and examined on 01/02/2024 PCP: Ostwalt, [name], PA-C  Patient coming from: Home  Chief Complaint: No chief complaint on file.   HPI: Ashley Camacho is a 33 y.o. female with medical history significant for Type 1 diabetes, HTN, adrenal insufficiency, CKD 3B, DVT not on [facility], previously severe gastroparesis that required G-tube(removed in January 2025), HFimpEF (EF 55 to 60% 11/16/2023) being admitted with CHF exacerbation not improving with outpatient Lasix  and GDMT management.  She was seen in the ED on 7/31, with dyspnea on exertion and fluid retention, treated with Lasix  and discharged.  She subsequently followed up with cardiologist, Dr. Rolan on 8/4 with intensified Lasix , who added additional GDMT meds and ordered cardiac MRI (due in September) to investigate for infiltrative disease .  On the morning of arrival, she developed chest discomfort and decided to come in for evaluation.  Chest pain improved spontaneously by arrival.  She endorses 3+ pitting edema, orthopnea and dyspnea on exertion. In the ED vitals within normal limits. Labs notable for troponin of 28, BNP over 4500.  Creatinine at baseline at 1.9, hemoglobin at baseline at 11.8.  EKG showed NSR at 77 with inferolateral T wave inversion and chest x-ray was clear. Patient treated with IV Lasix  60 mg Admission requested.      Past Medical History:  Diagnosis Date   Acute metabolic encephalopathy 06/10/2022   Adrenal insufficiency (HCC)    Anemia    CHF (congestive heart failure) (HCC)    Gastroparesis    Hypertension    Hypotension    Intractable nausea and vomiting 11/13/2022   Migraine    Thyroid  disease    Type 1 diabetes [facility])    Past Surgical History:  Procedure Laterality Date   BIOPSY  01/14/2021   Procedure: BIOPSY;  Surgeon: Eda Iha, MD;  Location: [facility] ENDOSCOPY;  Service:  Gastroenterology;;   CENTRAL LINE INSERTION N/A 10/10/2023   Procedure: CENTRAL LINE INSERTION;  Surgeon: Marea Selinda RAMAN, MD;  Location: [facility] INVASIVE CV LAB;  Service: Cardiovascular;  Laterality: N/A;   CESAREAN SECTION     x2   CESAREAN SECTION WITH BILATERAL TUBAL LIGATION N/A 02/17/2021   Procedure: CESAREAN SECTION WITH BILATERAL TUBAL LIGATION;  Surgeon: Barbra Lang PARAS, DO;  Location: [facility] LD ORS;  Service: Obstetrics;  Laterality: N/A;   ESOPHAGOGASTRODUODENOSCOPY (EGD) WITH PROPOFOL  N/A 01/14/2021   Procedure: ESOPHAGOGASTRODUODENOSCOPY (EGD) WITH PROPOFOL ;  Surgeon: Eda Iha, MD;  Location: [facility] ENDOSCOPY;  Service: Gastroenterology;  Laterality: N/A;   IR BONE MARROW BIOPSY & ASPIRATION  12/16/2022   IR REPLC DUODEN/JEJUNO TUBE PERCUT W/FLUORO  11/22/2022   JEJUNOSTOMY N/A 06/13/2022   Procedure: DEXTER;  Surgeon: Jordis Laneta FALCON, MD;  Location: [facility] ORS;  Service: General;  Laterality: N/A;   MOUTH SURGERY     Social History:  reports that she has never smoked. She has never been exposed to tobacco smoke. She has never used smokeless tobacco. She reports that she does not drink alcohol and does not use drugs.  No Known Allergies  Family History  Problem Relation Age of Onset   Breast cancer Mother    Seizures Mother    Diabetes type I Father    CAD Father    Lung cancer Maternal Grandfather    CAD Paternal Grandmother    CAD Paternal Grandfather    Ovarian cancer Neg Hx     Prior to  Admission medications   Medication Sig Start Date End Date Taking? Authorizing Provider  acetaminophen  (TYLENOL ) 500 MG tablet Take 2 tablets (1,000 mg total) by mouth every 6 (six) hours as needed for headache, fever or moderate pain. 08/05/22   Dorinda Drue DASEN, MD  BAQSIMI ONE PACK 3 MG/DOSE POWD Place 3 mg into the nose as needed. 06/09/23   [provider]  BD PEN NEEDLE MINI ULTRAFINE 31G X 5 MM MISC by Other route. 07/18/23 07/17/24  [provider]  Blood  Pressure Monitoring (BLOOD PRESSURE CUFF) MISC Use as directed Patient not taking: Reported on 12/26/2023 05/26/23   Loistine Sober, NP  cholecalciferol  (VITAMIN D3) 25 MCG (1000 UNIT) tablet  08/30/17   [provider]  Continuous Glucose Sensor (DEXCOM G7 SENSOR) MISC SMARTSIG:1 Each Every 10 Days    [provider]  furosemide  (LASIX ) 20 MG tablet Take 2 tablets (40 mg total) by mouth daily. 12/26/23 03/25/24  Rolan Ezra RAMAN, MD  hydrocortisone  (CORTEF ) 10 MG tablet Take 2 tablets (20 mg total) by mouth daily with breakfast AND 1 tablet (10 mg total) daily with supper. 05/27/23   [name], MD  insulin  aspart (NOVOLOG ) 100 UNIT/ML FlexPen Inject 2 Units into the skin 3 (three) times daily with meals. If eating and Blood Glucose (BG) 80 or higher inject 2 units for meal coverage and add correction dose per scale. If not eating, correction dose only. BG <150= 0 unit; BG 150-200= 1 unit; BG 201-250= 3 unit; BG 251-300= 5 unit; BG 301-350= 7 unit; BG 351-400= 9 unit; BG >400= 11 unit and Call Primary Care. 10/12/23   [name], MD  insulin  glargine (LANTUS ) 100 UNIT/ML Solostar Pen Inject 10 Units into the skin daily. 10/12/23   [name], MD  Insulin  Pen Needle (PEN NEEDLES) 31G X 6 MM MISC PEN NEEDLES 31G X 6 MM 05/31/17   [provider]  levothyroxine  (SYNTHROID ) 75 MCG tablet TAKE 1 TABLET (75 MCG TOTAL) BY MOUTH DAILY AT 6 (SIX) AM. 12/06/23   Ostwalt, [name], PA-C  metoprolol  tartrate (LOPRESSOR ) 25 MG tablet Take 1 tablet (25 mg total) by mouth 2 (two) times daily. 11/16/23 12/26/23  Trudy Anthony HERO, MD  Pancrelipase , Lip-Prot-Amyl, 24000-76000 units CPEP Take 1 capsule (24,000 Units total) by mouth 3 (three) times daily before meals. 11/04/23   [name], MD  saccharomyces boulardii (FLORASTOR) 250 MG capsule Take 250 mg by mouth 2 (two) times daily. Patient not taking: Reported on 12/26/2023    [provider]  spironolactone  (ALDACTONE ) 25 MG  tablet Take 0.5 tablets (12.5 mg total) by mouth daily. 12/26/23 03/25/24  Rolan Ezra RAMAN, MD    Physical Exam: Vitals:   01/02/24 1518 01/02/24 2130 01/02/24 2230 01/02/24 2231  BP:  133/74  136/82  Pulse:    88  Resp:  15 16 16   Temp:      SpO2:    100%  Weight: 59 kg     Height: 5' 1 (1.549 m)      Physical Exam Vitals and nursing note reviewed.  Constitutional:      General: She is not in acute distress. HENT:     Head: Normocephalic and atraumatic.  Cardiovascular:     Rate and Rhythm: Normal rate and regular rhythm.     Heart sounds: Normal heart sounds.  Pulmonary:     Effort: Pulmonary effort is normal.     Breath sounds: Normal breath sounds.  Abdominal:  Palpations: Abdomen is soft.     Tenderness: There is no abdominal tenderness.  Musculoskeletal:     Right lower leg: 3+ Edema present.     Left lower leg: 3+ Edema present.  Neurological:     Mental Status: Mental status is at baseline.     Labs on Admission: I have personally reviewed following labs and imaging studies  CBC: Recent Labs  Lab 01/02/24 1850  WBC 9.9  HGB 11.8*  HCT 37.0  MCV 95.1  PLT 174   Basic Metabolic Panel: Recent Labs  Lab 01/02/24 1730  NA 145  K 4.0  CL 108  CO2 26  GLUCOSE 98  BUN 59*  CREATININE 1.90*  CALCIUM  8.3*   GFR: Estimated Creatinine Clearance: 35.1 mL/min (A) (by C-G formula based on SCr of 1.9 mg/dL (H)). Liver Function Tests: No results for input(s): AST, ALT, ALKPHOS, BILITOT, PROT, ALBUMIN  in the last 168 hours. No results for input(s): LIPASE, AMYLASE in the last 168 hours. No results for input(s): AMMONIA in the last 168 hours. Coagulation Profile: No results for input(s): INR, PROTIME in the last 168 hours. Cardiac Enzymes: No results for input(s): CKTOTAL, CKMB, CKMBINDEX, TROPONINI in the last 168 hours. BNP (last 3 results) Recent Labs    12/21/23 1438  PROBNP >70000*   HbA1C: No results for  input(s): HGBA1C in the last 72 hours. CBG: Recent Labs  Lab 01/02/24 1844 01/02/24 2059  GLUCAP 69* 70   Lipid Profile: No results for input(s): CHOL, HDL, LDLCALC, TRIG, CHOLHDL, LDLDIRECT in the last 72 hours. Thyroid  Function Tests: No results for input(s): TSH, T4TOTAL, FREET4, T3FREE, THYROIDAB in the last 72 hours. Anemia Panel: No results for input(s): VITAMINB12, FOLATE, FERRITIN, TIBC, IRON , RETICCTPCT in the last 72 hours. Urine analysis:    Component Value Date/Time   COLORURINE YELLOW (A) 11/23/2023 0052   APPEARANCEUR Clear 12/21/2023 1438   LABSPEC 1.018 11/23/2023 0052   LABSPEC 1.026 03/01/2013 1844   PHURINE 5.0 11/23/2023 0052   GLUCOSEU Negative 12/21/2023 1438   GLUCOSEU >=500 03/01/2013 1844   HGBUR SMALL (A) 11/23/2023 0052   BILIRUBINUR Negative 12/21/2023 1438   BILIRUBINUR Negative 03/01/2013 1844   KETONESUR NEGATIVE 11/23/2023 0052   PROTEINUR 3+ (A) 12/21/2023 1438   PROTEINUR >=300 (A) 11/23/2023 0052   UROBILINOGEN 0.2 10/23/2020 0953   NITRITE Negative 12/21/2023 1438   NITRITE NEGATIVE 11/23/2023 0052   LEUKOCYTESUR Negative 12/21/2023 1438   LEUKOCYTESUR NEGATIVE 11/23/2023 0052   LEUKOCYTESUR Negative 03/01/2013 1844    Radiological Exams on Admission: DG Chest 2 View Result Date: 01/02/2024 CLINICAL DATA:  Chest pain and shortness of breath with leg swelling. EXAM: CHEST - 2 VIEW COMPARISON:  December 22, 2023 FINDINGS: The heart size and mediastinal contours are within normal limits. Low lung volumes are noted. Both lungs are clear. The visualized skeletal structures are unremarkable. IMPRESSION: No active cardiopulmonary disease. Electronically Signed   By: Suzen Dials M.D.   On: 01/02/2024 16:50   Data Reviewed for HPI: Relevant notes from primary care and specialist visits, past discharge summaries as available in EHR, including Care Everywhere. Prior diagnostic testing as pertinent to current  admission diagnoses Updated medications and problem lists for reconciliation ED course, including vitals, labs, imaging, treatment and response to treatment Triage notes, nursing and pharmacy notes and ED provider's notes Notable results as noted above in HPI      Assessment and Plan: * Acute on chronic HFrEF (heart failure with reduced ejection fraction) (HCC) Nonischemic  cardiomyopathy/peri-partum cardiomyopathy EF 40-45%(2022)-->25%(2023)-->50-55%(10/2023).  GDMT will be limited by CKD and upper normal K.  IV Lasix  Continue metoprolol  Continue recently initiated spironolactone  12.5 daily Not a candidate for SGLT2 inhibitor with type 1 diabetes(per cardiology note) Pending cardiac MRI and to infiltrative disease.  Pending cardiac PET to assess for ischemia(cath in 2/23 without significant CAD).  Continue prescribed graded compression stockings.  [facility] cardiology consulted for additional recommendations Daily weights with intake and output monitoring  Chest pain Elevated troponin Chest pain resolved spontaneously Troponin 28-continue to trend Negative cath 07/17/2023 Cardiac PET being arranged by primary cardiologist Cardiology consulted  Type 1 diabetes mellitus (HCC) Continue basal insulin  with sliding scale coverage No longer on insulin  pump  Gastroparesis Previous J tube status for severe gastroparesis No acute issues at this time-patient reports good appetite, weight gain Continue Pancrease  CKD stage 3b, GFR 30-44 ml/min (HCC) Renal function at baseline  Anemia of chronic disease Hemoglobin at baseline  Hypothyroidism Continue levothyroxine   Adrenal insufficiency (Addison's disease) (HCC) Continue hydrocortisone     DVT prophylaxis: Lovenox   Consults: [facility] cardiology  Advance Care Planning:   Code Status: Prior   Family Communication: none  Disposition Plan: Back to previous home environment  Severity of Illness: The appropriate patient status for  this patient is OBSERVATION. Observation status is judged to be reasonable and necessary in order to provide the required intensity of service to ensure the patient's safety. The patient's presenting symptoms, physical exam findings, and initial radiographic and laboratory data in the context of their medical condition is felt to place them at decreased risk for further clinical deterioration. Furthermore, it is anticipated that the patient will be medically stable for discharge from the hospital within 2 midnights of admission.   Author: Delayne LULLA Solian, MD 01/02/2024 11:13 PM  For on call review [email or web address].

## 2024-01-02 NOTE — Assessment & Plan Note (Signed)
 No acute issues at this time

## 2024-01-02 NOTE — ED Notes (Signed)
 Pt CBG 69. First Nurse Sam notified. Pt given orange juice

## 2024-01-02 NOTE — Assessment & Plan Note (Addendum)
Continue hydrocortisone

## 2024-01-02 NOTE — Assessment & Plan Note (Addendum)
 Continue basal insulin  with sliding scale coverage No longer on insulin  pump

## 2024-01-02 NOTE — ED Notes (Addendum)
 Pt couldn't urinate. Bladder scanned pt and it showed of urine. Provider aware.

## 2024-01-02 NOTE — Assessment & Plan Note (Addendum)
 Previous J tube status for severe gastroparesis No acute issues at this time-patient reports good appetite, weight gain Continue Pancrease

## 2024-01-02 NOTE — ED Provider Triage Note (Signed)
 Emergency Medicine Provider Triage Evaluation Note  Ashley Camacho , a 33 y.o. female  was evaluated in triage.  Pt complains of lower extremities edema.  Patient states in the last days her lower extremity edema increased, abdominal distention, shortness of breath, chest pain this morning that radiated to the left jaw and lasted 30 minutes.  Patient has history of cardiac heart failure, kidney disease, hypothyroidism, adrenal insufficiency, diabetes.  Patient is taking diuretics.  Patient was having a stress test tomorrow but it was canceled.  Cardiology referred her to ED  Review of Systems  Positive:  Negative:   Physical Exam  LMP 11/28/2023 (Approximate)  Gen:   Awake, no distress   Resp:  Normal effort, no crackles no wheezing MSK:   Moves extremities without difficulty  Other:  Lower extremity hard edema grade 3  Medical Decision Making  Medically screening exam initiated at 3:15 PM.  Appropriate orders placed.  ALMETA GEISEL was informed that the remainder of the evaluation will be completed by another provider, this initial triage assessment does not replace that evaluation, and the importance of remaining in the ED until their evaluation is complete.  Patient who presents with symptoms of lower extremity edema grade 3, abdominal distention, shortness of breath, chest pain radiates to the left jaw.  Ordered CBC CMP chest x-ray EKG troponins BNP   Janit Kast, PA-C 01/02/24 1520

## 2024-01-03 ENCOUNTER — Ambulatory Visit: Admission: RE | Admit: 2024-01-03 | Source: Ambulatory Visit

## 2024-01-03 DIAGNOSIS — Z86718 Personal history of other venous thrombosis and embolism: Secondary | ICD-10-CM | POA: Diagnosis not present

## 2024-01-03 DIAGNOSIS — Z79899 Other long term (current) drug therapy: Secondary | ICD-10-CM | POA: Diagnosis not present

## 2024-01-03 DIAGNOSIS — E1122 Type 2 diabetes mellitus with diabetic chronic kidney disease: Secondary | ICD-10-CM | POA: Diagnosis not present

## 2024-01-03 DIAGNOSIS — N1831 Chronic kidney disease, stage 3a: Secondary | ICD-10-CM | POA: Diagnosis not present

## 2024-01-03 DIAGNOSIS — I509 Heart failure, unspecified: Secondary | ICD-10-CM | POA: Diagnosis not present

## 2024-01-03 DIAGNOSIS — Z801 Family history of malignant neoplasm of trachea, bronchus and lung: Secondary | ICD-10-CM | POA: Diagnosis not present

## 2024-01-03 DIAGNOSIS — I129 Hypertensive chronic kidney disease with stage 1 through stage 4 chronic kidney disease, or unspecified chronic kidney disease: Secondary | ICD-10-CM | POA: Diagnosis not present

## 2024-01-03 DIAGNOSIS — R0602 Shortness of breath: Secondary | ICD-10-CM | POA: Diagnosis not present

## 2024-01-03 DIAGNOSIS — I5023 Acute on chronic systolic (congestive) heart failure: Secondary | ICD-10-CM | POA: Diagnosis not present

## 2024-01-03 DIAGNOSIS — F419 Anxiety disorder, unspecified: Secondary | ICD-10-CM | POA: Diagnosis present

## 2024-01-03 DIAGNOSIS — N184 Chronic kidney disease, stage 4 (severe): Secondary | ICD-10-CM | POA: Diagnosis not present

## 2024-01-03 DIAGNOSIS — E1043 Type 1 diabetes mellitus with diabetic autonomic (poly)neuropathy: Secondary | ICD-10-CM | POA: Diagnosis not present

## 2024-01-03 DIAGNOSIS — Z833 Family history of diabetes mellitus: Secondary | ICD-10-CM | POA: Diagnosis not present

## 2024-01-03 DIAGNOSIS — E10649 Type 1 diabetes mellitus with hypoglycemia without coma: Secondary | ICD-10-CM | POA: Diagnosis not present

## 2024-01-03 DIAGNOSIS — E876 Hypokalemia: Secondary | ICD-10-CM | POA: Diagnosis present

## 2024-01-03 DIAGNOSIS — D649 Anemia, unspecified: Secondary | ICD-10-CM | POA: Diagnosis not present

## 2024-01-03 DIAGNOSIS — I3139 Other pericardial effusion (noninflammatory): Secondary | ICD-10-CM | POA: Diagnosis not present

## 2024-01-03 DIAGNOSIS — N1832 Chronic kidney disease, stage 3b: Secondary | ICD-10-CM | POA: Diagnosis not present

## 2024-01-03 DIAGNOSIS — E785 Hyperlipidemia, unspecified: Secondary | ICD-10-CM | POA: Diagnosis not present

## 2024-01-03 DIAGNOSIS — I428 Other cardiomyopathies: Secondary | ICD-10-CM | POA: Diagnosis not present

## 2024-01-03 DIAGNOSIS — E271 Primary adrenocortical insufficiency: Secondary | ICD-10-CM | POA: Diagnosis not present

## 2024-01-03 DIAGNOSIS — D631 Anemia in chronic kidney disease: Secondary | ICD-10-CM | POA: Diagnosis not present

## 2024-01-03 DIAGNOSIS — I5033 Acute on chronic diastolic (congestive) heart failure: Secondary | ICD-10-CM

## 2024-01-03 DIAGNOSIS — I340 Nonrheumatic mitral (valve) insufficiency: Secondary | ICD-10-CM | POA: Diagnosis not present

## 2024-01-03 DIAGNOSIS — I110 Hypertensive heart disease with heart failure: Secondary | ICD-10-CM | POA: Diagnosis not present

## 2024-01-03 DIAGNOSIS — E8770 Fluid overload, unspecified: Secondary | ICD-10-CM | POA: Diagnosis present

## 2024-01-03 DIAGNOSIS — R609 Edema, unspecified: Secondary | ICD-10-CM | POA: Diagnosis not present

## 2024-01-03 DIAGNOSIS — N179 Acute kidney failure, unspecified: Secondary | ICD-10-CM | POA: Diagnosis not present

## 2024-01-03 DIAGNOSIS — Z419 Encounter for procedure for purposes other than remedying health state, unspecified: Secondary | ICD-10-CM | POA: Diagnosis not present

## 2024-01-03 DIAGNOSIS — Z803 Family history of malignant neoplasm of breast: Secondary | ICD-10-CM | POA: Diagnosis not present

## 2024-01-03 DIAGNOSIS — R079 Chest pain, unspecified: Secondary | ICD-10-CM | POA: Diagnosis not present

## 2024-01-03 DIAGNOSIS — E039 Hypothyroidism, unspecified: Secondary | ICD-10-CM | POA: Diagnosis not present

## 2024-01-03 DIAGNOSIS — Z8249 Family history of ischemic heart disease and other diseases of the circulatory system: Secondary | ICD-10-CM | POA: Diagnosis not present

## 2024-01-03 DIAGNOSIS — I5042 Chronic combined systolic (congestive) and diastolic (congestive) heart failure: Secondary | ICD-10-CM | POA: Diagnosis not present

## 2024-01-03 DIAGNOSIS — I10 Essential (primary) hypertension: Secondary | ICD-10-CM | POA: Diagnosis not present

## 2024-01-03 DIAGNOSIS — I130 Hypertensive heart and chronic kidney disease with heart failure and stage 1 through stage 4 chronic kidney disease, or unspecified chronic kidney disease: Secondary | ICD-10-CM | POA: Diagnosis not present

## 2024-01-03 DIAGNOSIS — Z794 Long term (current) use of insulin: Secondary | ICD-10-CM | POA: Diagnosis not present

## 2024-01-03 DIAGNOSIS — R0989 Other specified symptoms and signs involving the circulatory and respiratory systems: Secondary | ICD-10-CM | POA: Diagnosis not present

## 2024-01-03 DIAGNOSIS — I2489 Other forms of acute ischemic heart disease: Secondary | ICD-10-CM | POA: Diagnosis not present

## 2024-01-03 DIAGNOSIS — I5032 Chronic diastolic (congestive) heart failure: Secondary | ICD-10-CM | POA: Diagnosis not present

## 2024-01-03 DIAGNOSIS — E1022 Type 1 diabetes mellitus with diabetic chronic kidney disease: Secondary | ICD-10-CM | POA: Diagnosis not present

## 2024-01-03 DIAGNOSIS — Z79890 Hormone replacement therapy (postmenopausal): Secondary | ICD-10-CM | POA: Diagnosis not present

## 2024-01-03 LAB — CBC
HCT: 32.1 % — ABNORMAL LOW (ref 36.0–46.0)
Hemoglobin: 10.6 g/dL — ABNORMAL LOW (ref 12.0–15.0)
MCH: 31.1 pg (ref 26.0–34.0)
MCHC: 33 g/dL (ref 30.0–36.0)
MCV: 94.1 fL (ref 80.0–100.0)
Platelets: 134 K/uL — ABNORMAL LOW (ref 150–400)
RBC: 3.41 MIL/uL — ABNORMAL LOW (ref 3.87–5.11)
RDW: 16.9 % — ABNORMAL HIGH (ref 11.5–15.5)
WBC: 7.2 K/uL (ref 4.0–10.5)
nRBC: 0 % (ref 0.0–0.2)

## 2024-01-03 LAB — GLUCOSE, CAPILLARY
Glucose-Capillary: 143 mg/dL — ABNORMAL HIGH (ref 70–99)
Glucose-Capillary: 113 mg/dL — ABNORMAL HIGH (ref 70–99)
Glucose-Capillary: 96 mg/dL (ref 70–99)

## 2024-01-03 LAB — COMPREHENSIVE METABOLIC PANEL WITH GFR
ALT: 17 U/L (ref 0–44)
ALT: 16 U/L (ref 0–44)
AST: 12 U/L — ABNORMAL LOW (ref 15–41)
AST: 15 U/L (ref 15–41)
Albumin: 3 g/dL — ABNORMAL LOW (ref 3.5–5.0)
Albumin: 3.5 g/dL (ref 3.5–5.0)
Alkaline Phosphatase: 61 U/L (ref 38–126)
Alkaline Phosphatase: 65 U/L (ref 38–126)
Anion gap: 12 (ref 5–15)
Anion gap: 11 (ref 5–15)
BUN: 57 mg/dL — ABNORMAL HIGH (ref 6–20)
BUN: 54 mg/dL — ABNORMAL HIGH (ref 6–20)
CO2: 27 mmol/L (ref 22–32)
CO2: 27 mmol/L (ref 22–32)
Calcium: 8.3 mg/dL — ABNORMAL LOW (ref 8.9–10.3)
Calcium: 8.6 mg/dL — ABNORMAL LOW (ref 8.9–10.3)
Chloride: 107 mmol/L (ref 98–111)
Chloride: 105 mmol/L (ref 98–111)
Creatinine, Ser: 1.84 mg/dL — ABNORMAL HIGH (ref 0.44–1.00)
Creatinine, Ser: 2.08 mg/dL — ABNORMAL HIGH (ref 0.44–1.00)
GFR, Estimated: 37 mL/min — ABNORMAL LOW (ref >60)
GFR, Estimated: 32 mL/min — ABNORMAL LOW (ref >60)
Glucose, Bld: 81 mg/dL (ref 70–99)
Glucose, Bld: 134 mg/dL — ABNORMAL HIGH (ref 70–99)
Potassium: 3.7 mmol/L (ref 3.5–5.1)
Potassium: 3.9 mmol/L (ref 3.5–5.1)
Sodium: 146 mmol/L — ABNORMAL HIGH (ref 135–145)
Sodium: 143 mmol/L (ref 135–145)
Total Bilirubin: 0.8 mg/dL (ref 0.0–1.2)
Total Bilirubin: 0.8 mg/dL (ref 0.0–1.2)
Total Protein: 5.2 g/dL — ABNORMAL LOW (ref 6.5–8.1)
Total Protein: 6 g/dL — ABNORMAL LOW (ref 6.5–8.1)

## 2024-01-03 LAB — CBG MONITORING, ED
Glucose-Capillary: 273 mg/dL — ABNORMAL HIGH (ref 70–99)
Glucose-Capillary: 55 mg/dL — ABNORMAL LOW (ref 70–99)
Glucose-Capillary: 82 mg/dL (ref 70–99)
Glucose-Capillary: 205 mg/dL — ABNORMAL HIGH (ref 70–99)
Glucose-Capillary: 163 mg/dL — ABNORMAL HIGH (ref 70–99)

## 2024-01-03 LAB — PROTEIN / CREATININE RATIO, URINE
Creatinine, Urine: 12 mg/dL
Protein Creatinine Ratio: 2.33 mg/mg{creat} — ABNORMAL HIGH (ref 0.00–0.15)
Total Protein, Urine: 28 mg/dL

## 2024-01-03 LAB — HM HIV SCREENING LAB: HM HIV Screening: NEGATIVE

## 2024-01-03 LAB — HIV ANTIBODY (ROUTINE TESTING W REFLEX): HIV Screen 4th Generation wRfx: NONREACTIVE

## 2024-01-03 MED ORDER — INSULIN ASPART 100 UNIT/ML IJ SOLN: 0.0000 [IU] | INTRAMUSCULAR | Status: DC

## 2024-01-03 MED ORDER — INSULIN ASPART 100 UNIT/ML IJ SOLN
0.0000 [IU] | INTRAMUSCULAR | Status: DC
  Administered 2024-01-03 – 2024-01-04 (×3): 1 [IU] via SUBCUTANEOUS
  Administered 2024-01-04 (×2): 4 [IU] via SUBCUTANEOUS
  Administered 2024-01-04 (×3): 1 [IU] via SUBCUTANEOUS
  Administered 2024-01-05: 6 [IU] via SUBCUTANEOUS
  Administered 2024-01-05: 3 [IU] via SUBCUTANEOUS
  Administered 2024-01-05: 2 [IU] via SUBCUTANEOUS
  Administered 2024-01-05 (×2): 1 [IU] via SUBCUTANEOUS
  Administered 2024-01-06: 3 [IU] via SUBCUTANEOUS
  Administered 2024-01-06: 4 [IU] via SUBCUTANEOUS
  Administered 2024-01-07 (×2): 1 [IU] via SUBCUTANEOUS
  Filled 2024-01-03 (×13): qty 1

## 2024-01-03 MED ORDER — INSULIN ASPART 100 UNIT/ML IJ SOLN
2.0000 [IU] | Freq: Three times a day (TID) | INTRAMUSCULAR | Status: DC
  Administered 2024-01-03 – 2024-01-07 (×14): 2 [IU] via SUBCUTANEOUS
  Filled 2024-01-03 (×10): qty 1

## 2024-01-03 MED ORDER — INSULIN GLARGINE-YFGN 100 UNIT/ML ~~LOC~~ SOLN
8.0000 [IU] | Freq: Every day | SUBCUTANEOUS | Status: DC
  Administered 2024-01-04 – 2024-01-07 (×5): 8 [IU] via SUBCUTANEOUS
  Filled 2024-01-03 (×4): qty 0.08

## 2024-01-03 MED ORDER — METOPROLOL SUCCINATE ER 25 MG PO TB24
25.0000 mg | ORAL_TABLET | Freq: Every day | ORAL | Status: DC
  Administered 2024-01-04 – 2024-01-07 (×5): 25 mg via ORAL
  Filled 2024-01-03 (×4): qty 1

## 2024-01-03 MED ORDER — ALBUMIN HUMAN 25 % IV SOLN
25.0000 g | Freq: Two times a day (BID) | INTRAVENOUS | Status: DC
  Administered 2024-01-03 (×2): 25 g via INTRAVENOUS
  Administered 2024-01-03 (×2): 12.5 g via INTRAVENOUS
  Filled 2024-01-03 (×3): qty 100

## 2024-01-03 MED ORDER — ALBUMIN HUMAN 25 % IV SOLN: 12.5000 g | Freq: Two times a day (BID) | INTRAVENOUS | Status: DC

## 2024-01-03 MED ORDER — FUROSEMIDE 10 MG/ML IJ SOLN
80.0000 mg | Freq: Two times a day (BID) | INTRAMUSCULAR | Status: DC
  Administered 2024-01-03 (×4): 80 mg via INTRAVENOUS
  Filled 2024-01-03 (×2): qty 8

## 2024-01-03 MED ORDER — ROSUVASTATIN CALCIUM 10 MG PO TABS
10.0000 mg | ORAL_TABLET | Freq: Every day | ORAL | Status: DC
  Administered 2024-01-03 – 2024-01-07 (×6): 10 mg via ORAL
  Filled 2024-01-03 (×4): qty 1

## 2024-01-03 MED ORDER — SPIRONOLACTONE 25 MG PO TABS
25.0000 mg | ORAL_TABLET | Freq: Every day | ORAL | Status: DC
  Administered 2024-01-04 – 2024-01-05 (×3): 25 mg via ORAL
  Filled 2024-01-03 (×2): qty 1

## 2024-01-03 NOTE — ED Notes (Signed)
 CCMD called to initiate monitoring @ this time

## 2024-01-03 NOTE — ED Notes (Signed)
Phlebotomy called for lab draw 

## 2024-01-03 NOTE — ED Notes (Addendum)
 RN notified of CBG, 8 oz juice given. Pt alert

## 2024-01-03 NOTE — Progress Notes (Signed)
 PROGRESS NOTE    Ashley Camacho  FMW:[id number] DOB: 01-01-1991 DOA: 01/02/2024 PCP: Ostwalt, [name], PA-C  No chief complaint on file.   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  She underwent heart cath in 2023 which revealed no significant CAD.  In March 2023 she had severe AKI requiring transient dialysis thought to be secondary to acute interstitial nephritis. She was hospitalized at [facility] in June for pneumonia.  During that admission she received echocardiogram which revealed mostly preserved EF and normal diastolic function. She presented to the ED on 7/31 with dyspnea and lower extremity edema.  CXR at that time with vascular congestion.  BNP was severely elevated.  She was sent home on 20 mg Lasix  daily. Patient presented on this admission due to worsening shortness of breath and edema rising to her abdomen.  She also endorsed some chest pain which is already resolved. In the ED CXR is without acute abnormality, BNP over 45,000, serum creatinine 1.9.  She has gained over 30 pounds since May and reports adherence to all medications.  Heart failure team was consulted.  Subjective: This morning patient denies any chest pain.  She reports she started to feel better.  She reports that she has urinated some but not as much as she usually does when given Lasix .  She is confused why this keeps happening and wants to know how to stay out of the hospital.   Objective: Vitals:   01/03/24 1200 01/03/24 1230 01/03/24 1300 01/03/24 1330  BP: 139/86 138/80 136/75 139/79  Pulse: 80 78 81 76  Resp: 14 20 17  (!) 21   Temp:      TempSrc:      SpO2: 100% 100% 100% 100%  Weight:      Height:       No intake or output data in the 24 hours ending 01/03/24 1451 Filed Weights   01/02/24 1518  Weight: 59 kg    Examination: General exam: Appears calm and comfortable, NAD  Respiratory system: No work of breathing, symmetric chest wall expansion Cardiovascular system: S1 & S2 heard, RRR.  Gastrointestinal system: Abdomen is nondistended, soft and nontender.  Neuro: Alert and oriented. No focal neurological deficits. Extremities: 2+ pitting edema bilateral lower extremities, Skin: No rashes, lesions Psychiatry: Demonstrates appropriate judgement and insight. Mood & affect appropriate for situation.   Assessment & Plan:  Principal Problem:   Acute on chronic HFrEF (heart failure with reduced ejection fraction) (HCC) Active Problems:   Chest pain   Type 1 diabetes mellitus (HCC)   Gastroparesis   Anemia of chronic disease   CKD stage 3b, GFR 30-44 ml/min (HCC)   Hypothyroidism   Adrenal insufficiency (Addison's disease) (HCC)    Volume overload - Presumed secondary to acute on chronic heart failure exacerbation though EF was preserved with normal diastolic parameters in 2025 --Heart failure team consulted, appreciate recommendations - Continue with GDMT for now - Continue with Lasix  - UPCR is elevated at 2.33, though not technically nephrotic this may be playing a role - Have consulted nephrology who has recommended albumin  25 mg every 12 x 72 hours, appreciate further recommendations -  Strict I's and O's ordered, none recorded as of yet.  Have requested RN to update  Acute on chronic heart failure with reduced EF Nonischemic cardiomyopathy/peripartum cardiomyopathy - EF has been widely variable over the years.  Down to 25% in 2023, resolved towards 55% in June 2025 - Will defer echocardiogram to heart failure team - Continue GDMT though limited by CK and hyperkalemia - Continue with IV  Lasix  - Strict I's and O's - Not a candidate for SGLT2 - Pending cardiac MRI to assess for infiltrative disease - Pending cardiac PET to assess for ischemia - Cardiac cath in 2023 without significant CAD - Continue with Unna boots  Chest pain, resolved - Elevated troponin likely secondary to demand ischemia given volume overload - Monitor troponins, unlikely ACS - Negative left heart cath previously, further workup per cardiology  Type 1 diabetes - Continue basal/bolus, titrate up as tolerated - No longer on insulin  pump outpatient  Gastroparesis, severe - Recently required J-tube, removed in January - Continue home meds  CKD stage IIIb - Baseline creatinine appears to be close to 1.9, she is at that now - UPCR 2.33, some concern for nephrotic syndrome though not technically at diagnostic criteria - Albumin  is 3.0 - Nephrology has been consulted, currently recommending albumin  25 mg every 12 hours x 72 hours.  Ordered.  Anemia of chronic disease - Hemoglobin currently at baseline.  Trend  Hypothyroidism - Continue levothyroxine   Adrenal insufficiency - Continue with current dose hydrocortisone .  Hyperlipidemia - Crestor   DVT prophylaxis: lovenox    Code Status: Full Code Disposition:  Inpatient pending clinical resolution  Consultants:  Treatment Team:  Consulting Physician: Mady Bruckner, MD  Procedures:    Antimicrobials:  Anti-infectives (From admission, onward)    None       Data Reviewed: I have personally reviewed following labs and imaging studies CBC: Recent Labs  Lab 01/02/24 1850 01/03/24 0650  WBC 9.9 7.2  HGB 11.8* 10.6*  HCT 37.0 32.1*  MCV 95.1 94.1  PLT 174 134*   Basic Metabolic Panel: Recent Labs  Lab 01/02/24 1730 01/03/24 0650  NA 145 146*  K 4.0 3.7  CL 108 107  CO2 26 27  GLUCOSE 98 81  BUN 59* 57*  CREATININE 1.90* 1.84*  CALCIUM  8.3* 8.3*   GFR: Estimated Creatinine Clearance: 36.2 mL/min (A) (by C-G formula  based on SCr of 1.84 mg/dL (H)). Liver Function Tests: Recent Labs  Lab 01/03/24 0650  AST 12*  ALT 17  ALKPHOS 61  BILITOT 0.8  PROT 5.2*  ALBUMIN  3.0*   CBG: Recent Labs  Lab 01/02/24 2059 01/03/24 0141 01/03/24 0804 01/03/24 0852 01/03/24 1159  GLUCAP 70 273* 55* 82 205*    No results found for this or any previous visit (from the past 240 hours).   Radiology Studies: DG Chest 2 View Result Date: 01/02/2024 CLINICAL DATA:  Chest pain and shortness of breath with leg swelling. EXAM: CHEST - 2 VIEW COMPARISON:  December 22, 2023 FINDINGS: The heart size and mediastinal contours are within normal limits. Low lung volumes are noted. Both lungs are clear. The visualized skeletal structures are unremarkable. IMPRESSION: No active cardiopulmonary disease. Electronically Signed   By: Suzen Dials M.D.   On: 01/02/2024 16:50    Scheduled Meds:  enoxaparin  (LOVENOX ) injection  40 mg Subcutaneous Q24H   furosemide   80 mg Intravenous BID   hydrocortisone   20 mg Oral Q breakfast   And   hydrocortisone   10 mg Oral Q  supper   insulin  aspart  0-6 Units Subcutaneous Q4H   insulin  aspart  2 Units Subcutaneous TID WC   [START ON 01/04/2024] insulin  glargine-yfgn  8 Units Subcutaneous Daily   levothyroxine   75 mcg Oral Q0600   lipase/protease/amylase  24,000 Units Oral TID AC   [START ON 01/04/2024] metoprolol  succinate  25 mg Oral Daily   rosuvastatin   10 mg Oral Daily   [START ON 01/04/2024] spironolactone   25 mg Oral Daily   Continuous Infusions:   LOS: 0 days  MDM: Patient is high risk for one or more organ failure.  They necessitate ongoing hospitalization for continued IV therapies and subsequent lab monitoring. Total time spent interpreting labs and vitals, reviewing the medical record, coordinating care amongst consultants and care team members, directly assessing and discussing care with the patient and/or family: 55 min  Cyrah Mclamb, DO [facility]  To contact  the attending physician between 7A-7P please use Epic Chat. To contact the covering physician during after hours 7P-7A, please review Amion.  01/03/2024, 2:51 PM   *This document has been created with the assistance of dictation software. Please excuse typographical errors. *

## 2024-01-03 NOTE — ED Notes (Signed)
 Lab notified to come and collect morning blood draw

## 2024-01-03 NOTE — Plan of Care (Signed)

## 2024-01-03 NOTE — Inpatient Diabetes Management (Signed)
 Inpatient Diabetes Program Recommendations  AACE/ADA: New Consensus Statement on Inpatient Glycemic Control (2015)  Target Ranges:  Prepandial:   less than 140 mg/dL      Peak postprandial:   less than 180 mg/dL (1-2 hours)      Critically ill patients:  140 - 180 mg/dL   Lab Results  Component Value Date   GLUCAP 205 (H) 01/03/2024   HGBA1C 8.0 (H) 10/09/2023    Latest Reference Range & Units 01/02/24 18:44 01/02/24 20:59 01/03/24 01:41 01/03/24 08:04 01/03/24 08:52 01/03/24 11:59  Glucose-Capillary 70 - 99 mg/dL 69 (L) 70 726 (H) 55 (L) 82 205 (H)  (L): Data is abnormally low (H): Data is abnormally high  Diabetes history: DM1 Outpatient Diabetes medications: Lantus  10 units daily, Novolog  0-11 units TID, Novolog  2 units TID with meals; Cortef  20 mg QAM, Cortef  10 mg QPM, Dexcom G7 Current orders for Inpatient glycemic control: Semglee  10 units daily Novolog  0-9 units tid, 0-5 units hs Cortef  20 mg QAM, Cortef  10 mg QPM, Dexcom G7  Inpatient Diabetes Program Recommendations:   Please consider: -Decrease Novolog  correction to 0-6 units q 4 hrs. -Add Novolog  2 units tid meal coverage if eating 50% meal -May need to decrease Semglee  to 8 units daily  Thank you, Parminder Trapani E. Danie Hannig, RN, MSN, CDCES  Diabetes Coordinator Inpatient Glycemic Control Team Team Pager [phone] (8am-5pm) 01/03/2024 12:53 PM

## 2024-01-03 NOTE — TOC CM/SW Note (Signed)
..  Transition of Care [facility]) - Inpatient Brief Assessment   Patient Details  Name: Ashley Camacho MRN: [id number] Date of Birth: [birth year]  [facility] CM/SW Contact:    Edsel DELENA Fischer, LCSW Phone Number: 01/03/2024, 9:56 AM   Clinical Narrative:    Transition of Care Asessment:

## 2024-01-03 NOTE — Progress Notes (Signed)
 Heart Failure Navigator Progress Note  Assessed for Heart & Vascular TOC clinic readiness.  Patient does not meet criteria due to current Advanced Heart Failure team patient of Ezra Shuck, MD.   Navigator will sign off at this time.  Charmaine Pines, RN, BSN [facility] Heart Failure Navigator Secure Chat Only

## 2024-01-03 NOTE — ED Notes (Signed)
 Breakfast given by dining

## 2024-01-03 NOTE — ED Notes (Signed)
 Pt stating feeling ok, has no needs at this time. Informed waiting for morning meds to come from pharmacy.

## 2024-01-03 NOTE — Consult Note (Addendum)
 Advanced Heart Failure Team Consult Note   Primary Physician: Ostwalt, [name], PA-C Cardiologist:  Redell Cave, MD  Reason for Consultation: HFimpEF  HPI:    Ashley Camacho is seen today for evaluation of A/C HFimpEF at the request of Dr. Mady, Cardiology.   Ashley Camacho is a 33 y.o. female with history of type 1 diabetes, CKD stage 3/diabetic nephropathy, diabetic gastroparesis, adrenal insufficiency, and CHF.  She has a rather complex history.  Echo in 7/22 after the birth of her second daughter showed EF 40-45%.  This had declined to 25% by 2/23, ?peripartum CMP.  Cath in 2/23 at [facility] showed no significant CAD.  She had AKI in 3/23 requiring transient dialysis, possible acute interstitial nephritis.  She also had Enterococcal bacteremia that admission in 3/23. She has numerous diabetic complications including gastroparesis, nephropathy, and neuropathy.     She was hospitalized at [facility] in 6/25 for possible PNA.  She was seen in the ER 12/22/23 with dyspnea and LE edema.  CXR showed vascular congestion.  BNP was severely elevated.  Interestingly, patient had had an echo in 6/25 at [facility], this was fairly normal with EF 55-60%, GLS -23.4%, normal diastolic function, RV normal, mild-moderate MR, IVC normal.  Echo did not match clinical picture.  Patient was started on Lasix  20 mg daily when she was sent home from the ER.   She called the clinic yesterday and I advised her to come to the ED after reporting SOB, edema up to her back and chest pain that radiated to her jaw and continued to worsen. She came in a little later during the day as she was unable to leave her children unattended. Chest pain had mostly resolved by the time she came in. She reports anxiety and increased stress about stress test that was scheduled initially for today. Associated chest pain/pressure to that. Once in the ED workup showed: CXR with no acute findings, BNP >4.5K, SCr 1.9, HsTrop 29>28, EKG similar to last,  NSR. Got a dose of lasix  yesterday. She's up at least 30lbs from the end of May. Reports compliance with all medications. Denies CP at this time. Sleeps propped up on a pillow and blanket. SOB with activity, had progressively been worsening.   Home Medications Prior to Admission medications   Medication Sig Start Date End Date Taking? Authorizing Provider  acetaminophen  (TYLENOL ) 500 MG tablet Take 2 tablets (1,000 mg total) by mouth every 6 (six) hours as needed for headache, fever or moderate pain. 08/05/22  Yes Djan, Drue DASEN, MD  BAQSIMI ONE PACK 3 MG/DOSE POWD Place 3 mg into the nose as needed. 06/09/23  Yes [provider]  furosemide  (LASIX ) 20 MG tablet Take 2 tablets (40 mg total) by mouth daily. 12/26/23 03/25/24 Yes Rolan Ezra RAMAN, MD  hydrocortisone  (CORTEF ) 10 MG tablet Take 2 tablets (20 mg total) by mouth daily with breakfast AND 1 tablet (10 mg total) daily with supper. 05/27/23  Yes Sreenath, Sudheer B, MD  insulin  aspart (NOVOLOG ) 100 UNIT/ML FlexPen Inject 2 Units into the skin 3 (three) times daily with meals. If eating and Blood Glucose (BG) 80 or higher inject 2 units for meal coverage and add correction dose per scale. If not eating, correction dose only. BG <150= 0 unit; BG 150-200= 1 unit; BG 201-250= 3 unit; BG 251-300= 5 unit; BG 301-350= 7 unit; BG 351-400= 9 unit; BG >400= 11 unit and Call Primary Care. 10/12/23  Yes [name], MD  insulin  glargine (LANTUS ) 100 UNIT/ML Solostar Pen Inject 10 Units into the skin daily. 10/12/23  Yes Wieting, Richard, MD  levothyroxine  (SYNTHROID ) 75 MCG tablet TAKE 1 TABLET (75 MCG TOTAL) BY MOUTH DAILY AT 6 (SIX) AM. 12/06/23  Yes Ostwalt, [name], PA-C  metoprolol  tartrate (LOPRESSOR ) 25 MG tablet Take 1 tablet (25 mg total) by mouth 2 (two) times daily. 11/16/23 01/03/24 Yes Trudy Anthony HERO, MD  Pancrelipase , Lip-Prot-Amyl, 24000-76000 units CPEP Take 1 capsule (24,000 Units total) by mouth 3 (three) times daily before meals.  11/04/23  Yes [name], MD  spironolactone  (ALDACTONE ) 25 MG tablet Take 0.5 tablets (12.5 mg total) by mouth daily. 12/26/23 03/25/24 Yes Rolan Ezra RAMAN, MD  BD PEN NEEDLE MINI ULTRAFINE 31G X 5 MM MISC by Other route. 07/18/23 07/17/24  [provider]  Blood Pressure Monitoring (BLOOD PRESSURE CUFF) MISC Use as directed Patient not taking: Reported on 12/26/2023 05/26/23   Loistine Sober, NP  cholecalciferol  (VITAMIN D3) 25 MCG (1000 UNIT) tablet  08/30/17   [provider]  Continuous Glucose Sensor (DEXCOM G7 SENSOR) MISC SMARTSIG:1 Each Every 10 Days    [provider]  Insulin  Pen Needle (PEN NEEDLES) 31G X 6 MM MISC PEN NEEDLES 31G X 6 MM 05/31/17   [provider]  saccharomyces boulardii (FLORASTOR) 250 MG capsule Take 250 mg by mouth 2 (two) times daily. Patient not taking: Reported on 11/13/2023    [provider]    Past Medical History: Past Medical History:  Diagnosis Date   Acute metabolic encephalopathy 06/10/2022   Adrenal insufficiency (HCC)    Anemia    CHF (congestive heart failure) (HCC)    Gastroparesis    Hypertension    Hypotension    Intractable nausea and vomiting 11/13/2022   Migraine    Thyroid  disease    Type 1 diabetes (HCC)     Past Surgical History: Past Surgical History:  Procedure Laterality Date   BIOPSY  01/14/2021   Procedure: BIOPSY;  Surgeon: Eda Iha, MD;  Location: [facility] ENDOSCOPY;  Service: Gastroenterology;;   CENTRAL LINE INSERTION N/A 10/10/2023   Procedure: CENTRAL LINE INSERTION;  Surgeon: Marea Selinda RAMAN, MD;  Location: [facility] INVASIVE CV LAB;  Service: Cardiovascular;  Laterality: N/A;   CESAREAN SECTION     x2   CESAREAN SECTION WITH BILATERAL TUBAL LIGATION N/A 02/17/2021   Procedure: CESAREAN SECTION WITH BILATERAL TUBAL LIGATION;  Surgeon: Barbra Lang PARAS, DO;  Location: [facility] LD ORS;  Service: Obstetrics;  Laterality: N/A;   ESOPHAGOGASTRODUODENOSCOPY (EGD) WITH PROPOFOL  N/A 01/14/2021    Procedure: ESOPHAGOGASTRODUODENOSCOPY (EGD) WITH PROPOFOL ;  Surgeon: Eda Iha, MD;  Location: [facility] ENDOSCOPY;  Service: Gastroenterology;  Laterality: N/A;   IR BONE MARROW BIOPSY & ASPIRATION  12/16/2022   IR REPLC DUODEN/JEJUNO TUBE PERCUT W/FLUORO  11/22/2022   JEJUNOSTOMY N/A 06/13/2022   Procedure: JEJUNOSTOMY;  Surgeon: Jordis Laneta FALCON, MD;  Location: [facility] ORS;  Service: General;  Laterality: N/A;   MOUTH SURGERY      Family History: Family History  Problem Relation Age of Onset   Breast cancer Mother    Seizures Mother    Diabetes type I Father    CAD Father    Lung cancer Maternal Grandfather    CAD Paternal Grandmother    CAD Paternal Grandfather    Ovarian cancer Neg Hx     Social History: Social History   Socioeconomic History   Marital status: Single    Spouse name: Not on file  Number of children: Not on file   Years of education: Not on file   Highest education level: 9th grade  Occupational History   Occupation: home maker  Tobacco Use   Smoking status: Never    Passive exposure: Never   Smokeless tobacco: Never  Vaping Use   Vaping status: Never Used  Substance and Sexual Activity   Alcohol use: No   Drug use: No   Sexual activity: Not Currently    Birth control/protection: None  Other Topics Concern   Not on file  Social History Narrative   Not on file   Social Drivers of Health   Financial Resource Strain: Low Risk  (08/04/2023)   Received from [facility]   Overall Financial Resource Strain (CARDIA)    Difficulty of Paying Living Expenses: Not very hard  Recent Concern: Financial Resource Strain - High Risk (05/09/2023)   Overall Financial Resource Strain (CARDIA)    Difficulty of Paying Living Expenses: Very hard  Food Insecurity: No Food Insecurity (11/23/2023)   Hunger Vital Sign    Worried About Running Out of Food in the Last Year: Never true    Ran Out of Food in the Last Year: Never true  Recent Concern:  Food Insecurity - Food Insecurity Present (10/31/2023)   Hunger Vital Sign    Worried About Running Out of Food in the Last Year: Never true    Ran Out of Food in the Last Year: Sometimes true  Transportation Needs: No Transportation Needs (11/23/2023)   PRAPARE - Administrator, Civil Service (Medical): No    Lack of Transportation (Non-Medical): No  Recent Concern: Transportation Needs - Unmet Transportation Needs (10/31/2023)   PRAPARE - Administrator, Civil Service (Medical): Yes    Lack of Transportation (Non-Medical): No  Physical Activity: Unknown (05/09/2023)   Exercise Vital Sign    Days of Exercise per Week: 0 days    Minutes of Exercise per Session: Not on file  Stress: Stress Concern Present (05/09/2023)   [employer] of Occupational Health - Occupational Stress Questionnaire    Feeling of Stress : Very much  Social Connections: Socially Isolated (10/31/2023)   Social Connection and Isolation Panel    Frequency of Communication with Friends and Family: Once a week    Frequency of Social Gatherings with Friends and Family: More than three times a week    Attends Religious Services: Never    Database administrator or Organizations: No    Attends Engineer, structural: Never    Marital Status: Never married    Allergies:  No Known Allergies  Objective:    Vital Signs:   Temp:  [97.4 F (36.3 C)-98.4 F (36.9 C)] 97.8 F (36.6 C) (08/12 0818) Pulse Rate:  [76-88] 83 (08/12 0818) Resp:  [14-18] 15 (08/12 0818) BP: (128-148)/(74-94) 128/91 (08/12 0818) SpO2:  [100 %] 100 % (08/12 0818) Weight:  [59 kg] 59 kg (08/11 1518)    Weight change: Filed Weights   01/02/24 1518  Weight: 59 kg    Intake/Output:  No intake or output data in the 24 hours ending 01/03/24 0839    Physical Exam  General:  well appearing.  No respiratory difficulty Neck: JVD ~10 cm.  Cor: Regular rate & rhythm. No murmurs. Lungs: clear, diminished  bases Extremities: +2 BLE edema to thigh Neuro: alert & oriented x 3. Affect pleasant.   Telemetry   NSR 70-80s (Personally reviewed)  EKG    NSR 77 bpm. T wave abnormality 01/02/24 (similar findings to previous EKGs)  Labs   Basic Metabolic Panel: Recent Labs  Lab 01/02/24 1730 01/03/24 0650  NA 145 146*  K 4.0 3.7  CL 108 107  CO2 26 27  GLUCOSE 98 81  BUN 59* 57*  CREATININE 1.90* 1.84*  CALCIUM  8.3* 8.3*    Liver Function Tests: Recent Labs  Lab 01/03/24 0650  AST 12*  ALT 17  ALKPHOS 61  BILITOT 0.8  PROT 5.2*  ALBUMIN  3.0*   No results for input(s): LIPASE, AMYLASE in the last 168 hours. No results for input(s): AMMONIA in the last 168 hours.  CBC: Recent Labs  Lab 01/02/24 1850 01/03/24 0650  WBC 9.9 7.2  HGB 11.8* 10.6*  HCT 37.0 32.1*  MCV 95.1 94.1  PLT 174 134*    Cardiac Enzymes: No results for input(s): CKTOTAL, CKMB, CKMBINDEX, TROPONINI in the last 168 hours.  BNP: BNP (last 3 results) Recent Labs    12/22/23 1534 12/26/23 1546 01/02/24 1730  BNP 4,235.7* >4,500.0* >4,500.0*    ProBNP (last 3 results) Recent Labs    12/21/23 1438  PROBNP >70000*     CBG: Recent Labs  Lab 01/02/24 1844 01/02/24 2059 01/03/24 0141 01/03/24 0804  GLUCAP 69* 70 273* 55*    Coagulation Studies: No results for input(s): LABPROT, INR in the last 72 hours.   Imaging   DG Chest 2 View Result Date: 01/02/2024 CLINICAL DATA:  Chest pain and shortness of breath with leg swelling. EXAM: CHEST - 2 VIEW COMPARISON:  December 22, 2023 FINDINGS: The heart size and mediastinal contours are within normal limits. Low lung volumes are noted. Both lungs are clear. The visualized skeletal structures are unremarkable. IMPRESSION: No active cardiopulmonary disease. Electronically Signed   By: Suzen Dials M.D.   On: 01/02/2024 16:50     Medications:   Current Medications:  enoxaparin  (LOVENOX ) injection  40 mg Subcutaneous  Q24H   furosemide   40 mg Intravenous BID   hydrocortisone   20 mg Oral Q breakfast   And   hydrocortisone   10 mg Oral Q supper   insulin  aspart  0-5 Units Subcutaneous QHS   insulin  aspart  0-9 Units Subcutaneous TID WC   insulin  glargine-yfgn  10 Units Subcutaneous Daily   levothyroxine   75 mcg Oral Q0600   lipase/protease/amylase  24,000 Units Oral TID AC   metoprolol  tartrate  25 mg Oral BID   spironolactone   12.5 mg Oral Daily    Infusions:   Patient Profile   Ashley Camacho is a 33 y.o. female with history of type 1 diabetes, CKD stage 3/diabetic nephropathy, diabetic gastroparesis, adrenal insufficiency, and CHF. Admitted with chest pressure, found to have A/C HFimpEF.   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 There was concern that this could be a peri-partum cardiomyopathy. Echo 7/22 after the birth of her second daughter showed EF 40-45%. This had declined to 25% by 2/23, ?peripartum CMP. Cath in 2/23 at [facility] showed no significant CAD. However, echo in 6/25 at [facility] was reviewed and noted to be relatively normal except for mild to moderate MR => EF 55-60%, GLS -23.4%, normal diastolic function, RV normal, mild-moderate MR, IVC normal. Despite the unremarkable echo, she continues to have CHF exacerbations.  - NYHA class IIIb symptoms. GDMT will be limited by CKD and upper normal K.  -  Volume overloaded on exam. Up ~30lbs from the end of May. Will start lasix  80 IV BID.  - Continue spiro 12.5 mg daily - Not a candidate for SGKT2i with DBI - Continue lopressor  25 mg BID, can cut back if she responds poorly to diuresis - She was scheduled for a stress test today but missed the appt d/t hospitalization. Now rescheduled.  - Plan for cMRI to assess for infiltrative heart disease.  - Consider repeating echo  - Place UNNA boots - Strict I&O, daily weights  Chest pain - Cath in  2023 no significant CAD - Strong family history of CAD - Reported chest pressure on admission - Stress test now rescheduled - HsTrop 29>28, suspect demand ischemia in the setting of volume overload - Denies further chest pain  CKD stage 3a Diabetic nephropathy - Baseline SCr ~ 1.6-1.9 - last 1.84 - Continue to follow with diuresis - Avoid hypotension  DB I - Not candidate for SGLT2i - Insulin  per primary  Adrenal insufficiency - Continue hydrocortisone  per primary team  Length of Stay: 0  Beckey LITTIE Coe, NP  01/03/2024, 8:39 AM   Advanced Heart Failure Team Pager [phone] (M-F; 7a - 5p)  Please contact [facility] for night-coverage after hours (4p -7a ) and weekends on [email or web address]   Patient seen with NP, I formulated the plan and agree with the above note.   History as noted above.  Patient was coming for a stress test today but noted chest heaviness and exertional dyspnea and went to the ER instead.  Weight has been trending up despite starting Lasix  40 mg po daily and spironolactone  at last visit with me.  HS-TnI was minimally elevated with no trend.  Creatinine 1.84 which is near her baseline. BNP > 4500.    General: NAD Neck: JVP 10 cm with HJR, no thyromegaly or thyroid  nodule.  Lungs: Clear to auscultation bilaterally with normal respiratory effort. CV: Nondisplaced PMI.  Heart regular S1/S2, no S3/S4, no murmur.  1+ edema to knees.  No carotid bruit.  Normal pedal pulses.  Abdomen: Soft, nontender, no hepatosplenomegaly, no distention.  Skin: Intact without lesions or rashes.  Neurologic: Alert and oriented x 3.  Psych: Normal affect. Extremities: No clubbing or cyanosis.  HEENT: Normal.   1. Chronic HF with improved EF: Patient has history of nonischemic cardiomyopathy that was primarily followed at [facility] in the past.  There was concern that this could be a peri-partum cardiomyopathy.  Echo in 7/22 after the birth of her second daughter showed EF 40-45%.  This  had declined to 25% by 2/23, ?peripartum CMP.  Cath in 2/23 at [facility] showed no significant CAD.  However, echo in 6/25 at [facility] was reviewed and noted to be relatively normal except for mild to moderate MR => EF 55-60%, GLS -23.4%, normal diastolic function, RV normal, mild-moderate MR, IVC normal.  Despite the unremarkable echo, she has struggled with volume overload recently. Urine protein/creatinine ratio and albumen level are not consistent with nephrotic syndrome as an explanation for her volume overload.  BNP is markedly elevated and she looks volume overloaded on exam.  Mildly elevated troponin with no trend is likely related to demand ischemia from volume overload.  GDMT will be limited by CKD and upper normal K.  - Lasix  80 mg IV bid - Increase spironolactone  to 25 mg daily.  - She is not a candidate for SGLT2 inhibitor with type 1 diabetes.  - I will arrange for cardiac MRI to reassess  LV/RV function and assess for infiltrative disease, will try to get this done as inpatient.  - She has a strong family history of premature CAD (father) and type 1 diabetes.  She had a cath in 2/23 without significant CAD.  I think that her current symptoms are related to volume overload.  She should get eventual noninvasive ischemic evaluation, but would hold off on coronary angiography for now with elevated creatinine and no ACS.  - Unna boots while inpatient.  2. Hyperlipidemia: Will start statin given type 1 diabetes, Crestor  10 mg daily.  3. CKD stage 3: Diabetic nephropathy.  - Follow creatinine closely with diuresis.   4. Type 1 diabetes: Continue insulin  per primary team.   Ezra Shuck 01/03/2024

## 2024-01-04 ENCOUNTER — Inpatient Hospital Stay (HOSPITAL_COMMUNITY)

## 2024-01-04 DIAGNOSIS — I5023 Acute on chronic systolic (congestive) heart failure: Secondary | ICD-10-CM | POA: Diagnosis not present

## 2024-01-04 DIAGNOSIS — I3139 Other pericardial effusion (noninflammatory): Secondary | ICD-10-CM | POA: Diagnosis not present

## 2024-01-04 DIAGNOSIS — I5033 Acute on chronic diastolic (congestive) heart failure: Secondary | ICD-10-CM | POA: Diagnosis not present

## 2024-01-04 DIAGNOSIS — D649 Anemia, unspecified: Secondary | ICD-10-CM

## 2024-01-04 DIAGNOSIS — I10 Essential (primary) hypertension: Secondary | ICD-10-CM

## 2024-01-04 LAB — BASIC METABOLIC PANEL WITH GFR
Anion gap: 12 (ref 5–15)
BUN: 59 mg/dL — ABNORMAL HIGH (ref 6–20)
CO2: 31 mmol/L (ref 22–32)
Calcium: 9 mg/dL (ref 8.9–10.3)
Chloride: 100 mmol/L (ref 98–111)
Creatinine, Ser: 1.99 mg/dL — ABNORMAL HIGH (ref 0.44–1.00)
GFR, Estimated: 34 mL/min — ABNORMAL LOW (ref >60)
Glucose, Bld: 146 mg/dL — ABNORMAL HIGH (ref 70–99)
Potassium: 3.8 mmol/L (ref 3.5–5.1)
Sodium: 143 mmol/L (ref 135–145)

## 2024-01-04 LAB — MAGNESIUM: Magnesium: 1.8 mg/dL (ref 1.7–2.4)

## 2024-01-04 LAB — IRON AND TIBC
Iron: 59 ug/dL (ref 28–170)
Saturation Ratios: 35 % — ABNORMAL HIGH (ref 10.4–31.8)
TIBC: 169 ug/dL — ABNORMAL LOW (ref 250–450)
UIBC: 110 ug/dL

## 2024-01-04 LAB — PREGNANCY, URINE: Preg Test, Ur: NEGATIVE

## 2024-01-04 LAB — GLUCOSE, CAPILLARY
Glucose-Capillary: 188 mg/dL — ABNORMAL HIGH (ref 70–99)
Glucose-Capillary: 104 mg/dL — ABNORMAL HIGH (ref 70–99)
Glucose-Capillary: 129 mg/dL — ABNORMAL HIGH (ref 70–99)
Glucose-Capillary: 197 mg/dL — ABNORMAL HIGH (ref 70–99)
Glucose-Capillary: 343 mg/dL — ABNORMAL HIGH (ref 70–99)

## 2024-01-04 LAB — FERRITIN: Ferritin: 612 ng/mL — ABNORMAL HIGH (ref 11–307)

## 2024-01-04 LAB — LACTIC ACID, PLASMA: Lactic Acid, Venous: 1.4 mmol/L (ref 0.5–1.9)

## 2024-01-04 MED ORDER — GADOBUTROL 1 MMOL/ML IV SOLN
10.0000 mL | Freq: Once | INTRAVENOUS | Status: AC | PRN
Start: 2024-01-04 — End: 2024-01-04
  Administered 2024-01-04 (×2): 10 mL via INTRAVENOUS

## 2024-01-04 MED ORDER — ALBUMIN HUMAN 25 % IV SOLN
25.0000 g | Freq: Three times a day (TID) | INTRAVENOUS | Status: DC
  Administered 2024-01-04 (×2): 25 g via INTRAVENOUS

## 2024-01-04 MED ORDER — DIVALPROEX SODIUM 250 MG PO DR TAB
250.0000 mg | DELAYED_RELEASE_TABLET | Freq: Every morning | ORAL | Status: DC
  Administered 2024-01-04 – 2024-01-05 (×3): 250 mg via ORAL
  Filled 2024-01-04 (×4): qty 1

## 2024-01-04 MED ORDER — FUROSEMIDE 10 MG/ML IJ SOLN: 120.0000 mg | Freq: Two times a day (BID) | INTRAVENOUS | Status: DC

## 2024-01-04 MED ORDER — ALBUMIN HUMAN 25 % IV SOLN
25.0000 g | Freq: Three times a day (TID) | INTRAVENOUS | Status: AC
  Administered 2024-01-04 – 2024-01-06 (×10): 25 g via INTRAVENOUS
  Filled 2024-01-04 (×9): qty 100

## 2024-01-04 MED ORDER — LOSARTAN POTASSIUM 25 MG PO TABS
12.5000 mg | ORAL_TABLET | Freq: Every day | ORAL | Status: DC
  Administered 2024-01-04 – 2024-01-05 (×3): 12.5 mg via ORAL
  Filled 2024-01-04 (×2): qty 1

## 2024-01-04 MED ORDER — HYDRALAZINE HCL 10 MG PO TABS: 10.0000 mg | ORAL_TABLET | Freq: Three times a day (TID) | ORAL | Status: DC

## 2024-01-04 MED ORDER — METOLAZONE 2.5 MG PO TABS
2.5000 mg | ORAL_TABLET | Freq: Once | ORAL | Status: AC
  Administered 2024-01-04 (×2): 2.5 mg via ORAL
  Filled 2024-01-04: qty 1

## 2024-01-04 MED ORDER — FUROSEMIDE 10 MG/ML IJ SOLN
15.0000 mg/h | INTRAVENOUS | Status: DC
  Administered 2024-01-04 (×2): 4 mg/h via INTRAVENOUS
  Administered 2024-01-05 – 2024-01-06 (×3): 15 mg/h via INTRAVENOUS
  Filled 2024-01-04 (×4): qty 20

## 2024-01-04 MED ORDER — HYDRALAZINE HCL 10 MG PO TABS
10.0000 mg | ORAL_TABLET | Freq: Three times a day (TID) | ORAL | Status: DC
  Administered 2024-01-04 – 2024-01-07 (×10): 10 mg via ORAL
  Filled 2024-01-04 (×9): qty 1

## 2024-01-04 MED ORDER — ACETAZOLAMIDE 250 MG PO TABS
500.0000 mg | ORAL_TABLET | Freq: Once | ORAL | Status: AC
  Administered 2024-01-04 (×2): 500 mg via ORAL
  Filled 2024-01-04: qty 2

## 2024-01-04 NOTE — Progress Notes (Addendum)
 Advanced Heart Failure Rounding Note  Cardiologist: Redell Cave, MD  Chief Complaint:  Subjective:    I/O incomplete, multiple occurrences. Labs pending. Last sCr stable ~2. Patient reports that her UOP was not impressive yesterday. Mild hypertension with SBP in 140s. CMR completed this morning.   Sitting up in bed, eating breakfast. Reports feeling well this morning. No chest pain or shortness of breath.   Objective:    Weight Range: 59.8 kg Body mass index is 24.91 kg/m.   Vital Signs:   Temp:  [97.3 F (36.3 C)-97.9 F (36.6 C)] 97.9 F (36.6 C) (08/13 0453) Pulse Rate:  [64-84] 82 (08/13 0453) Resp:  [14-23] 16 (08/13 0453) BP: (121-149)/(69-96) 147/88 (08/13 0453) SpO2:  [98 %-100 %] 98 % (08/13 0453) Weight:  [59.8 kg] 59.8 kg (08/13 0500) Last BM Date : 01/03/24  Weight change: Filed Weights   01/02/24 1518 01/04/24 0500  Weight: 59 kg 59.8 kg   Intake/Output:  Intake/Output Summary (Last 24 hours) at 01/04/2024 0734 Last data filed at 01/04/2024 9370 Gross per 24 hour  Intake 530 ml  Output 800 ml  Net -270 ml    Physical Exam    General: Well appearing. Pale.  Cardiac: JVP elevated. S1 and loud S2 present.  Abdomen: Soft, non-tender, non-distended.  Extremities: Warm and dry.  2+ BLE edema up to thighs Neuro: Alert and oriented x3. Affect pleasant.   Telemetry   SR in 80s (personally reviewed)  Labs    CBC Recent Labs    01/02/24 1850 01/03/24 0650  WBC 9.9 7.2  HGB 11.8* 10.6*  HCT 37.0 32.1*  MCV 95.1 94.1  PLT 174 134*   Basic Metabolic Panel Recent Labs    91/87/74 0650 01/03/24 1753  NA 146* 143  K 3.7 3.9  CL 107 105  CO2 27 27  GLUCOSE 81 134*  BUN 57* 54*  CREATININE 1.84* 2.08*  CALCIUM  8.3* 8.6*   Liver Function Tests Recent Labs    01/03/24 0650 01/03/24 1753  AST 12* 15  ALT 17 16  ALKPHOS 61 65  BILITOT 0.8 0.8  PROT 5.2* 6.0*  ALBUMIN  3.0* 3.5   BNP (last 3 results) Recent Labs     12/22/23 1534 12/26/23 1546 01/02/24 1730  BNP 4,235.7* >4,500.0* >4,500.0*   ProBNP (last 3 results) Recent Labs    12/21/23 1438  PROBNP >70000*    Imaging   No results found.  Medications:    Scheduled Medications:  enoxaparin  (LOVENOX ) injection  40 mg Subcutaneous Q24H   furosemide   80 mg Intravenous BID   hydrocortisone   20 mg Oral Q breakfast   And   hydrocortisone   10 mg Oral Q supper   insulin  aspart  0-6 Units Subcutaneous Q4H   insulin  aspart  2 Units Subcutaneous TID WC   insulin  glargine-yfgn  8 Units Subcutaneous Daily   levothyroxine   75 mcg Oral Q0600   lipase/protease/amylase  24,000 Units Oral TID AC   metoprolol  succinate  25 mg Oral Daily   rosuvastatin   10 mg Oral Daily   spironolactone   25 mg Oral Daily    Infusions:  albumin  human 25 g (01/03/24 2205)    PRN Medications: acetaminophen  **OR** acetaminophen , HYDROcodone -acetaminophen , ondansetron  **OR** ondansetron  (ZOFRAN ) IV  Patient Profile   Ashley Camacho is a 33 y.o. female with history of type 1 diabetes, CKD stage 3/diabetic nephropathy, diabetic gastroparesis, adrenal insufficiency, and CHF. Admitted with chest pressure, found to have A/C HFimpEF.   Assessment/Plan  1. Chronic HF with improved EF: Patient has history of nonischemic cardiomyopathy that was primarily followed at [facility] in the past.  There was concern that this could be a peri-partum cardiomyopathy.  Echo in 7/22 after the birth of her second daughter showed EF 40-45%.  This had declined to 25% by 2/23, ?peripartum CMP.  Cath in 2/23 at [facility] showed no significant CAD.  However, echo in 6/25 at [facility] was reviewed and noted to be relatively normal except for mild to moderate MR => EF 55-60%, GLS -23.4%, normal diastolic function, RV normal, mild-moderate MR, IVC normal.  Despite the unremarkable echo, she has struggled with volume overload recently. Urine protein/creatinine ratio and albumen level are not consistent with  nephrotic syndrome as an explanation for her volume overload. Elevated troponin without trend likely related to demand ischemia from volume overload.  - Volume up on exam. I/Os not complete, however patient reports marginal UOP yesterday.  - Increase Lasix  to 120 mg bid + dose diamox  500 mg x1 - GDMT will be limited by CKD and upper normal K.  - continue spironolactone  to 25 mg daily. CTM Cr - start toprol  XL 25 mg daily - HTN, start low dose losartan  12.5 mg daily Avoid hypotension. Follow Cr.  - She is not a candidate for SGLT2 inhibitor with type 1 diabetes.  - TED hose until UNNA boots are able to be placed - CMR complete, read pending - She has a strong family history of premature CAD (father) and type 1 diabetes.  She had a cath in 2/23 without significant CAD. Her current symptoms appear related to volume overload. She should get eventual noninvasive ischemic evaluation, but would hold off on coronary angiography for now with elevated creatinine and no ACS.  2. Hyperlipidemia: continue crestor  10 mg daily.  3. CKD stage 3: Diabetic nephropathy.  - Follow creatinine closely with diuresis and addition of ARB. Currently stable around 2. 4. Type 1 diabetes: Continue insulin  per primary team.  5. HTN: BP elevated. Start low dose losartan  12.5 mg. Follow Cr. Avoid hypotension 6. Anemia: Suspect Fe deficiency due to chronic disease. Hgb 10.5, pale. Check Fe studies today.  Length of Stay: 1  [place] Lee, NP  01/04/2024, 7:34 AM  Advanced Heart Failure Team Pager [phone] (M-F; 7a - 5p)  Please contact [facility] for night-coverage after hours (5p -7a ) and weekends on [email or web address]   Patient seen with NP, I formulated the plan and agree with the above note.   I/Os not fully recorded but she says that she did not urinate a lot.  Creatinine stable at 2.08 => 1.99.   Cardiac MRI: 1.  Bilateral pleural effusions, R>L. 2.  Small pericardial effusion. 3.  Mildly dilated LV with LV EF 35%,  global hypokinesis. 4.  Normal RV size with RV EF 34%. 5. No myocardial LGE, so no definitive evidence for prior MI, infiltrative disease, or myocarditis. 6. Normal extracellular volume percentage but T2 mildly elevated.  Significance uncertain. 7.  Possible severe mitral regurgitation, regurgitant fraction 45%.  General: NAD Neck: JVP 12-14 cm, no thyromegaly or thyroid  nodule.  Lungs: Clear to auscultation bilaterally with normal respiratory effort. CV: Nondisplaced PMI.  Heart regular S1/S2, no S3/S4, 2/6 HSM apex.  2+ edema to knees.  Abdomen: Soft, nontender, no hepatosplenomegaly, no distention.  Skin: Intact without lesions or rashes.  Neurologic: Alert and oriented x 3.  Psych: Normal affect. Extremities: No clubbing or cyanosis.  HEENT: Normal.   Moderately depressed LV  and RV function by cardiac MRI, no LGE/scarring noted.  There also appears to be severe MR.  Echo from 6/25 was normal.  I think coronary disease is unlikely with no LGE on cMRI and normal cath in 2023.  Would avoid coronary angiography with elevated creatinine.  ?Diabetic cardiomyopathy.  She remains volume overloaded on exam.  - She is currently on Lasix  4 mg/hr which is unlikely to effect significant diuresis.  Increase to 15 mg/hr and will give metolazone  2.5 x 1 this afternoon.  - Afterload reduction with significant MR (BP is elevated).  She is now on losartan  12.5 daily and hydralazine  10 mg tid.  - She needs further evaluation for mitral regurgitation, will get TEE on Friday after more diuresis.  Of note, she had a PEA arrest with endoscopy in the past, but was when she was critically ill in the ICU at [facility] in 2023 (sepsis, DKA).   Ezra Shuck 01/04/2024 2:44 PM

## 2024-01-04 NOTE — Progress Notes (Signed)
 Progress Note   Patient: Ashley Camacho FMW:[id number] DOB: 09/23/1990 DOA: 01/02/2024     1 DOS: the patient was seen and examined on 01/04/2024   Brief hospital course: 33yo with h/o T1DM, stage 3b CKD, 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  Cardiology consulted, treated with Lasix .  Cardiac MRI is pending.  Assessment and Plan:   Acute on chronic heart failure with reduced EF Nonischemic cardiomyopathy/peripartum cardiomyopathy - EF has been widely variable over the years.  Down to 25% in 2023, resolved towards 55% in June 2025 -- Presented with volume overload in the setting of presumed acute on chronic heart failure exacerbation though EF was preserved with normal diastolic parameters in 2025 -- Heart failure team consulted, appreciate recommendations - Continue with GDMT for now - Continue with Lasix , changed from bolus to drip by nephrology - UPCR is elevated at 2.33, though not technically nephrotic this may be playing a role - Have consulted nephrology who has recommended albumin  25 mg every 12 x 72 hours, appreciate further recommendations - Strict I's and O's ordered - MRI ordered by heart failure team, shows EF 35% with global hypokinesis and concern for severe MR - Not a candidate for SGLT2 due to T1DM - Cardiac cath in 2023 without significant CAD - Continue with Unna boots   Chest pain, resolved - Elevated troponin likely secondary to demand ischemia given volume overload - Monitor troponins, unlikely ACS - Negative left heart cath previously -- Further workup per cardiology   Type 1 diabetes - Continue basal/bolus, titrate up as tolerated - No longer on insulin  pump outpatient --Last A1c in 09/2023 was 8, reasonable but not optimal control    Gastroparesis, severe - Recently required J-tube, removed in January - Continue home meds   CKD stage IIIb - Baseline creatinine appears to be close to 1.9, she is at that now - UPCR 2.33, some concern for nephrotic syndrome though not technically at diagnostic criteria - Albumin  is 3.0 - Nephrology has been consulted, currently recommending albumin  25 mg every 8 hours to optimize fluid removal   Anemia of chronic disease - Hemoglobin currently at baseline.  Trend   Hypothyroidism - Continue levothyroxine    Adrenal insufficiency - Continue with current dose hydrocortisone .   Hyperlipidemia - Continue Crestor     Consultants: Cardiology DM coordinator [facility] team  Procedures: Cardiac MRI  Antibiotics: None  30 Day Unplanned Readmission Risk Score    Flowsheet Row ED to Hosp-Admission (Current) from 01/02/2024 in [facility] PCU  30 Day Unplanned Readmission Risk Score (%) 45.79 Filed at 01/04/2024 0801    This score is the patient's risk of an unplanned readmission within 30 days of being discharged (0 -100%). The score is based on dignosis, age, lab data, medications, orders, and past utilization.   Low:  0-14.9   Medium: 15-21.9   High: 22-29.9   Extreme: 30 and above           Subjective: Feeling ok, on room air.  No specific complaints.   Objective: Vitals:   01/04/24 0453 01/04/24 0804  BP: (!) 147/88 (!) 147/85  Pulse: 82 76  Resp: 16 18  Temp: 97.9 F (36.6 C) 97.8 F (36.6 C)  SpO2: 98% 100%    Intake/Output Summary (Last 24 hours) at 01/04/2024 0940 Last data filed at  01/04/2024 0629 Gross per 24 hour  Intake 530 ml  Output 800 ml  Net -270 ml   Filed Weights   01/02/24 1518 01/04/24 0500  Weight: 59 kg 59.8 kg    Exam:  General:  Appears calm and comfortable and is in NAD Eyes:   normal lids, iris ENT:  grossly normal hearing, lips & tongue, mmm Cardiovascular:  RRR. Scant LE edema., TED hose in place Respiratory:    CTA bilaterally with no wheezes/rales/rhonchi.  Normal respiratory effort. Abdomen:  soft, NT, ND Skin:  no rash or induration seen on limited exam Musculoskeletal:  grossly normal tone BUE/BLE, good ROM, no bony abnormality Psychiatric:  grossly normal mood and affect, speech fluent and appropriate, AOx3 Neurologic:  CN 2-12 grossly intact, moves all extremities in coordinated fashion  Data Reviewed: I have reviewed the patient's lab results since admission.  Pertinent labs for today include:   Glucose 146 BUN 59/Creatinine 1.99/GFR 34, stable     Family Communication: None present  Disposition: Status is: Inpatient Remains inpatient appropriate because: ongoing management     Time spent: 50 minutes  Unresulted Labs (From admission, onward)     Start     Ordered   01/09/24 0500  Creatinine, serum  (enoxaparin  (LOVENOX )    CrCl >/= 30 ml/min)  Weekly,   TIMED     Comments: while on enoxaparin  therapy    01/02/24 2322   01/04/24 0827  Iron  and TIBC  Once,   R       Question:  Specimen collection method  Answer:  Lab=Lab collect   01/04/24 0826   01/04/24 0827  Ferritin  Once,   R       Question:  Specimen collection method  Answer:  Lab=Lab collect   01/04/24 0826   01/04/24 0755  Magnesium   Once,   R       Question:  Specimen collection method  Answer:  Lab=Lab collect   01/04/24 0754   01/04/24 9247  Basic metabolic panel  Daily,   R     Question:  Specimen collection method  Answer:  Lab=Lab collect   01/04/24 9248             Author: Delon Herald, MD 01/04/2024 9:40 AM  For on call review [email or web address].

## 2024-01-04 NOTE — Hospital Course (Addendum)
 32yo with h/o T1DM, stage 3b CKD, 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  Cardiology consulted, treated with Lasix .  Cardiac MRI with EF 35%, global hypokinesis, concern for severe MR.  Cardiology is planning TEE and probable heart cath.

## 2024-01-04 NOTE — Consult Note (Signed)
 [facility]  CONSULT NOTE    Date: 01/04/2024                  Patient Name:  ADIBA FARGNOLI  MRN: [id number]  DOB: 1991/04/09  Age / Sex: 33 y.o., female         PCP: Ostwalt, [name], PA-C                 Service Requesting Consult: TRH                 Reason for Consult: Volume overload with chronic kidney disease            History of Present Illness: Ms. ANGALA HILGERS is a 33 y.o.  female with past medical history of T1DM, CHF, hypertension, gastroparesis,a nd chronic kidney disease stage IIIb, who was admitted to [facility] on 01/02/2024 for CHF exacerbation (HCC) [I50.9] Volume overload [E87.70] Acute on chronic congestive heart failure, unspecified heart failure type [facility]) [I50.9]  Patient presents to the emergency department with leg swelling and chest pain.  Reported chest pain began the morning of ED arrival.Reports no precipitating factors.  Denies fever or chills.  States she normally has leg swelling.  Was recently treated for volume overload and shortness of breath recently in emergency department.  Was treated with Lasix  and discharged with follow-up with cardiology.  Labs on ED arrival concerning for BUN 59, creatinine 1.90 with GFR 36, BNP greater than 4500, troponin 29, and hemoglobin 11.8.  UA appears clear with some bacteria.  Chest x-ray negative.  Creatinine peaked yesterday at 2.08.  Baseline appears to be 1.7-1.8.  Follow-up   Medications: Outpatient medications: Medications Prior to Admission  Medication Sig Dispense Refill Last Dose/Taking   acetaminophen  (TYLENOL ) 500 MG tablet Take 2 tablets (1,000 mg total) by mouth every 6 (six) hours as needed for headache, fever or moderate pain. 30 tablet 3 Taking As Needed   BAQSIMI ONE PACK 3 MG/DOSE POWD Place 3 mg into the nose as needed.   Taking As Needed   divalproex  (DEPAKOTE ) 250 MG DR tablet Take 250 mg by mouth in the morning.   Taking   furosemide  (LASIX ) 20 MG tablet Take 2 tablets  (40 mg total) by mouth daily. 60 tablet 5 Taking   hydrocortisone  (CORTEF ) 10 MG tablet Take 2 tablets (20 mg total) by mouth daily with breakfast AND 1 tablet (10 mg total) daily with supper. 90 tablet 0 Taking   insulin  aspart (NOVOLOG ) 100 UNIT/ML FlexPen Inject 2 Units into the skin 3 (three) times daily with meals. If eating and Blood Glucose (BG) 80 or higher inject 2 units for meal coverage and add correction dose per scale. If not eating, correction dose only. BG <150= 0 unit; BG 150-200= 1 unit; BG 201-250= 3 unit; BG 251-300= 5 unit; BG 301-350= 7 unit; BG 351-400= 9 unit; BG >400= 11 unit and Call Primary Care.   Taking   insulin  glargine (LANTUS ) 100 UNIT/ML Solostar Pen Inject 10 Units into the skin daily.   Taking   levothyroxine  (SYNTHROID ) 75 MCG tablet TAKE 1 TABLET (75 MCG TOTAL) BY MOUTH DAILY AT 6 (SIX) AM. 90 tablet 0 Taking   metoprolol  tartrate (LOPRESSOR ) 25 MG tablet Take 1 tablet (25 mg total) by mouth 2 (two) times daily. 60 tablet 0 Taking   Pancrelipase , Lip-Prot-Amyl, 24000-76000 units CPEP Take 1 capsule (24,000 Units total) by mouth 3 (three) times daily before meals. 270 capsule 0 Taking  spironolactone  (ALDACTONE ) 25 MG tablet Take 0.5 tablets (12.5 mg total) by mouth daily. 45 tablet 3 Taking   BD PEN NEEDLE MINI ULTRAFINE 31G X 5 MM MISC by Other route.      Continuous Glucose Sensor (DEXCOM G7 SENSOR) MISC SMARTSIG:1 Each Every 10 Days      Insulin  Pen Needle (PEN NEEDLES) 31G X 6 MM MISC PEN NEEDLES 31G X 6 MM       Current medications: Current Facility-Administered Medications  Medication Dose Route Frequency Provider Last Rate Last Admin   acetaminophen  (TYLENOL ) tablet 650 mg  650 mg Oral Q6H PRN Duncan, Hazel V, MD       Or   acetaminophen  (TYLENOL ) suppository 650 mg  650 mg Rectal Q6H PRN Duncan, Hazel V, MD       albumin  human 25 % solution 25 g  25 g Intravenous TID Lee, Jordan, NP       divalproex  (DEPAKOTE ) DR tablet 250 mg  250 mg Oral q AM Barbarann Nest, MD   250 mg at 01/04/24 1011   enoxaparin  (LOVENOX ) injection 40 mg  40 mg Subcutaneous Q24H Cleatus Hoof V, MD   40 mg at 01/03/24 0844   furosemide  (LASIX ) 200 mg in dextrose  5 % 100 mL (2 mg/mL) infusion  4 mg/hr Intravenous Continuous Druscilla Bald, NP 2 mL/hr at 01/04/24 1025 4 mg/hr at 01/04/24 1025   HYDROcodone -acetaminophen  (NORCO/VICODIN) 5-325 MG per tablet 1-2 tablet  1-2 tablet Oral Q4H PRN Duncan, Hazel V, MD       hydrocortisone  (CORTEF ) tablet 20 mg  20 mg Oral Q breakfast Cleatus Hoof GAILS, MD   20 mg at 01/04/24 9075   And   hydrocortisone  (CORTEF ) tablet 10 mg  10 mg Oral Q supper Duncan, Hazel V, MD   10 mg at 01/03/24 1857   insulin  aspart (novoLOG ) injection 0-6 Units  0-6 Units Subcutaneous Q4H Dezii, Alexandra, DO   1 Units at 01/04/24 0510   insulin  aspart (novoLOG ) injection 2 Units  2 Units Subcutaneous TID WC Dezii, Alexandra, DO   2 Units at 01/04/24 1252   insulin  glargine-yfgn (SEMGLEE ) injection 8 Units  8 Units Subcutaneous Daily Dezii, Alexandra, DO   8 Units at 01/04/24 0929   levothyroxine  (SYNTHROID ) tablet 75 mcg  75 mcg Oral Q0600 Cleatus Hoof GAILS, MD   75 mcg at 01/04/24 0511   lipase/protease/amylase (CREON ) capsule 24,000 Units  24,000 Units Oral TID AC Duncan, Hazel V, MD   24,000 Units at 01/04/24 1250   losartan  (COZAAR ) tablet 12.5 mg  12.5 mg Oral Daily Lee, [place], NP   12.5 mg at 01/04/24 9077   metoprolol  succinate (TOPROL -XL) 24 hr tablet 25 mg  25 mg Oral Daily McLean, Dalton S, MD   25 mg at 01/04/24 9077   ondansetron  (ZOFRAN ) tablet 4 mg  4 mg Oral Q6H PRN Duncan, Hazel V, MD       Or   ondansetron  (ZOFRAN ) injection 4 mg  4 mg Intravenous Q6H PRN Duncan, Hazel V, MD       rosuvastatin  (CRESTOR ) tablet 10 mg  10 mg Oral Daily McLean, Dalton S, MD   10 mg at 01/04/24 9077   spironolactone  (ALDACTONE ) tablet 25 mg  25 mg Oral Daily McLean, Dalton S, MD   25 mg at 01/04/24 9077      Allergies: No Known Allergies    Past Medical  History: Past Medical History:  Diagnosis Date   Acute metabolic encephalopathy 06/10/2022  Adrenal insufficiency (HCC)    Anemia    CHF (congestive heart failure) (HCC)    Gastroparesis    Hypertension    Hypotension    Intractable nausea and vomiting 11/13/2022   Migraine    Thyroid  disease    Type 1 diabetes [facility])      Past Surgical History: Past Surgical History:  Procedure Laterality Date   BIOPSY  01/14/2021   Procedure: BIOPSY;  Surgeon: Eda Iha, MD;  Location: [facility] ENDOSCOPY;  Service: Gastroenterology;;   CENTRAL LINE INSERTION N/A 10/10/2023   Procedure: CENTRAL LINE INSERTION;  Surgeon: Marea Selinda RAMAN, MD;  Location: [facility] INVASIVE CV LAB;  Service: Cardiovascular;  Laterality: N/A;   CESAREAN SECTION     x2   CESAREAN SECTION WITH BILATERAL TUBAL LIGATION N/A 02/17/2021   Procedure: CESAREAN SECTION WITH BILATERAL TUBAL LIGATION;  Surgeon: Barbra Lang PARAS, DO;  Location: [facility] LD ORS;  Service: Obstetrics;  Laterality: N/A;   ESOPHAGOGASTRODUODENOSCOPY (EGD) WITH PROPOFOL  N/A 01/14/2021   Procedure: ESOPHAGOGASTRODUODENOSCOPY (EGD) WITH PROPOFOL ;  Surgeon: Eda Iha, MD;  Location: [facility] ENDOSCOPY;  Service: Gastroenterology;  Laterality: N/A;   IR BONE MARROW BIOPSY & ASPIRATION  12/16/2022   IR REPLC DUODEN/JEJUNO TUBE PERCUT W/FLUORO  11/22/2022   JEJUNOSTOMY N/A 06/13/2022   Procedure: DEXTER;  Surgeon: Jordis Laneta FALCON, MD;  Location: [facility] ORS;  Service: General;  Laterality: N/A;   MOUTH SURGERY       Family History: Family History  Problem Relation Age of Onset   Breast cancer Mother    Seizures Mother    Diabetes type I Father    CAD Father    Lung cancer Maternal Grandfather    CAD Paternal Grandmother    CAD Paternal Grandfather    Ovarian cancer Neg Hx      Social History: Social History   Socioeconomic History   Marital status: Single    Spouse name: Not on file   Number of children: Not on file   Years of education: Not on  file   Highest education level: 9th grade  Occupational History   Occupation: home maker  Tobacco Use   Smoking status: Never    Passive exposure: Never   Smokeless tobacco: Never  Vaping Use   Vaping status: Never Used  Substance and Sexual Activity   Alcohol use: No   Drug use: No   Sexual activity: Not Currently    Birth control/protection: None  Other Topics Concern   Not on file  Social History Narrative   Not on file   Social Drivers of Health   Financial Resource Strain: Low Risk  (08/04/2023)   Received from [facility]   Overall Financial Resource Strain (CARDIA)    Difficulty of Paying Living Expenses: Not very hard  Recent Concern: Physicist, medical Strain - High Risk (05/09/2023)   Overall Financial Resource Strain (CARDIA)    Difficulty of Paying Living Expenses: Very hard  Food Insecurity: No Food Insecurity (01/03/2024)   Hunger Vital Sign    Worried About Running Out of Food in the Last Year: Never true    Ran Out of Food in the Last Year: Never true  Recent Concern: Food Insecurity - Food Insecurity Present (10/31/2023)   Hunger Vital Sign    Worried About Running Out of Food in the Last Year: Never true    Ran Out of Food in the Last Year: Sometimes true  Transportation Needs: No Transportation Needs (01/03/2024)   PRAPARE - Transportation  Lack of Transportation (Medical): No    Lack of Transportation (Non-Medical): No  Recent Concern: Transportation Needs - Unmet Transportation Needs (10/31/2023)   PRAPARE - Administrator, Civil Service (Medical): Yes    Lack of Transportation (Non-Medical): No  Physical Activity: Unknown (05/09/2023)   Exercise Vital Sign    Days of Exercise per Week: 0 days    Minutes of Exercise per Session: Not on file  Stress: Stress Concern Present (05/09/2023)   [employer] of Occupational Health - Occupational Stress Questionnaire    Feeling of Stress : Very much  Social Connections:  Socially Isolated (01/03/2024)   Social Connection and Isolation Panel    Frequency of Communication with Friends and Family: Once a week    Frequency of Social Gatherings with Friends and Family: More than three times a week    Attends Religious Services: Never    Database administrator or Organizations: No    Attends Banker Meetings: Never    Marital Status: Never married  Intimate Partner Violence: Not At Risk (01/03/2024)   Humiliation, Afraid, Rape, and Kick questionnaire    Fear of Current or Ex-Partner: No    Emotionally Abused: No    Physically Abused: No    Sexually Abused: No     Review of Systems: Review of Systems  Constitutional:  Negative for chills, fever and malaise/fatigue.  HENT:  Negative for congestion, sore throat and tinnitus.   Eyes:  Negative for blurred vision and redness.  Respiratory:  Positive for shortness of breath. Negative for cough and wheezing.   Cardiovascular:  Positive for leg swelling. Negative for chest pain, palpitations and claudication.  Gastrointestinal:  Negative for abdominal pain, blood in stool, diarrhea, nausea and vomiting.  Genitourinary:  Negative for flank pain, frequency and hematuria.  Musculoskeletal:  Negative for back pain, falls and myalgias.  Skin:  Negative for rash.  Neurological:  Negative for dizziness, weakness and headaches.  Endo/Heme/Allergies:  Does not bruise/bleed easily.  Psychiatric/Behavioral:  Negative for depression. The patient is not nervous/anxious and does not have insomnia.     Vital Signs: Blood pressure 137/80, pulse 76, temperature 98.1 F (36.7 C), resp. rate 16, height 5' 1 (1.549 m), weight 59.8 kg, last menstrual period 11/28/2023, SpO2 100%.  Weight trends: Filed Weights   01/02/24 1518 01/04/24 0500  Weight: 59 kg 59.8 kg    Physical Exam: General: NAD,   Head: Normocephalic, atraumatic. Moist oral mucosal membranes  Eyes: Anicteric  Neck: Supple  Lungs:  Clear to  auscultation  Heart: Regular rate and rhythm  Abdomen:  Soft, nontender  Extremities: 1+ peripheral edema.  Neurologic: Awake and alert  Skin: No lesions        Lab results: Basic Metabolic Panel: Recent Labs  Lab 01/03/24 0650 01/03/24 1753 01/04/24 0853  NA 146* 143 143  K 3.7 3.9 3.8  CL 107 105 100  CO2 27 27 31   GLUCOSE 81 134* 146*  BUN 57* 54* 59*  CREATININE 1.84* 2.08* 1.99*  CALCIUM  8.3* 8.6* 9.0  MG  --   --  1.8    Liver Function Tests: Recent Labs  Lab 01/03/24 0650 01/03/24 1753  AST 12* 15  ALT 17 16  ALKPHOS 61 65  BILITOT 0.8 0.8  PROT 5.2* 6.0*  ALBUMIN  3.0* 3.5   No results for input(s): LIPASE, AMYLASE in the last 168 hours. No results for input(s): AMMONIA in the last 168 hours.  CBC: Recent Labs  Lab 01/02/24 1850 01/03/24 0650  WBC 9.9 7.2  HGB 11.8* 10.6*  HCT 37.0 32.1*  MCV 95.1 94.1  PLT 174 134*    Cardiac Enzymes: No results for input(s): CKTOTAL, CKMB, CKMBINDEX, TROPONINI in the last 168 hours.  BNP: Invalid input(s): POCBNP  CBG: Recent Labs  Lab 01/03/24 2041 01/03/24 2314 01/04/24 0445 01/04/24 0802 01/04/24 1156  GLUCAP 113* 96 188* 104* 129*    Microbiology: Results for orders placed or performed in visit on 12/21/23  Microscopic Examination     Status: None   Collection Time: 12/21/23  2:38 PM  Result Value Ref Range Status   WBC, UA 0-5 0 - 5 /hpf Final   RBC, Urine None seen 0 - 2 /hpf Final   Epithelial Cells (non renal) 0-10 0 - 10 /hpf Final   Casts None seen None seen /lpf Final   Bacteria, UA None seen None seen/Few Final    Coagulation Studies: No results for input(s): LABPROT, INR in the last 72 hours.  Urinalysis: Recent Labs    01/02/24 2340  COLORURINE COLORLESS*  LABSPEC 1.006  PHURINE 6.0  GLUCOSEU 50*  HGBUR SMALL*  BILIRUBINUR NEGATIVE  KETONESUR NEGATIVE  PROTEINUR 30*  NITRITE NEGATIVE  LEUKOCYTESUR NEGATIVE      Imaging: MR CARDIAC  MORPHOLOGY W WO CONTRAST Result Date: 01/04/2024 CLINICAL DATA:  CHF, cardiomyopathy EXAM: CARDIAC MRI TECHNIQUE: The patient was scanned on a 1.5 Tesla GE magnet. A dedicated cardiac coil was used. Functional imaging was done using Fiesta sequences. 2,3, and 4 chamber views were done to assess for RWMA's. Modified Simpson's rule using a short axis stack was used to calculate an ejection fraction on a dedicated work Research officer, trade union. The patient received 8 cc of Gadavist . After 10 minutes inversion recovery sequences were used to assess for infiltration and scar tissue. FINDINGS: Limited images of the lung fields show moderate right, small left pleural effusion. Small circumferential pericardial effusion. Mildly dilated left ventricular size with normal wall thickness. Global hypokinesis with LV EF 35%. Normal RV size with RV EF 34%. Mild biatrial enlargement. Trileaflet aortic valve, no significant stenosis, mild aortic insufficiency (regurgitant fraction 12%). Mitral regurgitant fraction 45% suggests severe mitral regurgitation. On delayed enhancement imaging, there was no myocardial late gadolinium enhancement (LGE) noted. MEASUREMENTS: MEASUREMENTS LVEDV 157 mL LVEDVi 98 mL/m2 LVSV 55 mL LVEF 35% RVEDV 153 mL RVEDVi 95 mL/m2 RVSV 53 mL RVEF 34% Aortic forward volume 30 mL Aortic regurgitant fraction 12% Global T1 1113, ECV 28% Global T2 57, mildly elevated IMPRESSION: 1.  Bilateral pleural effusions, R>L. 2.  Small pericardial effusion. 3.  Mildly dilated LV with LV EF 35%, global hypokinesis. 4.  Normal RV size with RV EF 34%. 5. No myocardial LGE, so no definitive evidence for prior MI, infiltrative disease, or myocarditis. 6. Normal extracellular volume percentage but T2 mildly elevated. Significance uncertain. 7.  Possible severe mitral regurgitation, regurgitant fraction 45%. Dalton Mclean Electronically Signed   By: Ezra Shuck M.D.   On: 01/04/2024 13:03   MR CARDIAC VELOCITY FLOW  MAP Result Date: 01/04/2024 CLINICAL DATA:  CHF, cardiomyopathy EXAM: CARDIAC MRI TECHNIQUE: The patient was scanned on a 1.5 Tesla GE magnet. A dedicated cardiac coil was used. Functional imaging was done using Fiesta sequences. 2,3, and 4 chamber views were done to assess for RWMA's. Modified Simpson's rule using a short axis stack was used to calculate an ejection fraction on a dedicated work Research officer, trade union. The patient received  8 cc of Gadavist . After 10 minutes inversion recovery sequences were used to assess for infiltration and scar tissue. FINDINGS: Limited images of the lung fields show moderate right, small left pleural effusion. Small circumferential pericardial effusion. Mildly dilated left ventricular size with normal wall thickness. Global hypokinesis with LV EF 35%. Normal RV size with RV EF 34%. Mild biatrial enlargement. Trileaflet aortic valve, no significant stenosis, mild aortic insufficiency (regurgitant fraction 12%). Mitral regurgitant fraction 45% suggests severe mitral regurgitation. On delayed enhancement imaging, there was no myocardial late gadolinium enhancement (LGE) noted. MEASUREMENTS: MEASUREMENTS LVEDV 157 mL LVEDVi 98 mL/m2 LVSV 55 mL LVEF 35% RVEDV 153 mL RVEDVi 95 mL/m2 RVSV 53 mL RVEF 34% Aortic forward volume 30 mL Aortic regurgitant fraction 12% Global T1 1113, ECV 28% Global T2 57, mildly elevated IMPRESSION: 1.  Bilateral pleural effusions, R>L. 2.  Small pericardial effusion. 3.  Mildly dilated LV with LV EF 35%, global hypokinesis. 4.  Normal RV size with RV EF 34%. 5. No myocardial LGE, so no definitive evidence for prior MI, infiltrative disease, or myocarditis. 6. Normal extracellular volume percentage but T2 mildly elevated. Significance uncertain. 7.  Possible severe mitral regurgitation, regurgitant fraction 45%. Dalton Mclean Electronically Signed   By: Ezra Shuck M.D.   On: 01/04/2024 13:03   MR CARDIAC VELOCITY FLOW MAP Result Date:  01/04/2024 CLINICAL DATA:  CHF, cardiomyopathy EXAM: CARDIAC MRI TECHNIQUE: The patient was scanned on a 1.5 Tesla GE magnet. A dedicated cardiac coil was used. Functional imaging was done using Fiesta sequences. 2,3, and 4 chamber views were done to assess for RWMA's. Modified Simpson's rule using a short axis stack was used to calculate an ejection fraction on a dedicated work Research officer, trade union. The patient received 8 cc of Gadavist . After 10 minutes inversion recovery sequences were used to assess for infiltration and scar tissue. FINDINGS: Limited images of the lung fields show moderate right, small left pleural effusion. Small circumferential pericardial effusion. Mildly dilated left ventricular size with normal wall thickness. Global hypokinesis with LV EF 35%. Normal RV size with RV EF 34%. Mild biatrial enlargement. Trileaflet aortic valve, no significant stenosis, mild aortic insufficiency (regurgitant fraction 12%). Mitral regurgitant fraction 45% suggests severe mitral regurgitation. On delayed enhancement imaging, there was no myocardial late gadolinium enhancement (LGE) noted. MEASUREMENTS: MEASUREMENTS LVEDV 157 mL LVEDVi 98 mL/m2 LVSV 55 mL LVEF 35% RVEDV 153 mL RVEDVi 95 mL/m2 RVSV 53 mL RVEF 34% Aortic forward volume 30 mL Aortic regurgitant fraction 12% Global T1 1113, ECV 28% Global T2 57, mildly elevated IMPRESSION: 1.  Bilateral pleural effusions, R>L. 2.  Small pericardial effusion. 3.  Mildly dilated LV with LV EF 35%, global hypokinesis. 4.  Normal RV size with RV EF 34%. 5. No myocardial LGE, so no definitive evidence for prior MI, infiltrative disease, or myocarditis. 6. Normal extracellular volume percentage but T2 mildly elevated. Significance uncertain. 7.  Possible severe mitral regurgitation, regurgitant fraction 45%. Dalton Mclean Electronically Signed   By: Ezra Shuck M.D.   On: 01/04/2024 13:03   DG Chest 2 View Result Date: 01/02/2024 CLINICAL DATA:  Chest pain  and shortness of breath with leg swelling. EXAM: CHEST - 2 VIEW COMPARISON:  December 22, 2023 FINDINGS: The heart size and mediastinal contours are within normal limits. Low lung volumes are noted. Both lungs are clear. The visualized skeletal structures are unremarkable. IMPRESSION: No active cardiopulmonary disease. Electronically Signed   By: Suzen Dials M.D.   On: 01/02/2024 16:50  Assessment & Plan: Ms. GLENNETTE GALSTER is a 33 y.o.  female with past medical history of T1DM, CHF, hypertension, gastroparesis,a nd chronic kidney disease stage IIIb, who was admitted to [facility] on 01/02/2024 for CHF exacerbation (HCC) [I50.9] Volume overload [E87.70] Acute on chronic congestive heart failure, unspecified heart failure type (HCC) [I50.9]   Chronic diastolic heart failure with volume overload. ECHO from 11/02/23 shows EF 55-60 % Chest x-ray negative for acute findings.. Currently treated with bolus furosemide .  Will transition to IV furosemide  drip at 4 mg/h. Will also increase IV albumin  25g to three times daily to optimize fluid removal.   Chronic kidney disease stage IIIb.  Baseline creatinine appears to be 1.7-1.8.  Renal function remains at baseline.  Will continue to monitor.  3. Diabetes mellitus type I with chronic kidney disease/renal manifestations: insulin  dependent. Home regimen includes Novolog  and Lantus . Most recent hemoglobin A1c is 8.0 on 10/09/23.   4. Anemia of chronic kidney disease Lab Results  Component Value Date   HGB 10.6 (L) 01/03/2024    Hgb acceptable for renal patient  5. Hypertension with chronic kidney disease. Home regimen includes furosemide , metoprolol  and spironolactone .  LOS: 1 Zylon Creamer 8/13/20251:56 PM

## 2024-01-04 NOTE — TOC Initial Note (Signed)
 Transition of Care [facility]) - Initial/Assessment Note    Patient Details  Name: Ashley Camacho MRN: [id number] Date of Birth: 02-14-1991  Transition of Care [facility]) CM/SW Contact:    Lauraine [name], LCSW Phone Number: 01/04/2024, 12:45 PM  Clinical Narrative:  Readmission prevention screen complete. CSW met with patient. No family at bedside. CSW introduced role and explained that discharge planning would be discussed. PCP is [name], PA-C. Roommate drives her to appointments. [facility] [place]. No issues obtaining medications. Patient lives home with her roommate, roommate's boyfriend, and her two children who split time between her and their father. No home health or DME use prior to admission. No further concerns. CSW will continue to follow patient for support and facilitate return home once stable. Roommate will transport her home at discharge.                Expected Discharge Plan: Home/Self Care Barriers to Discharge: Continued Medical Work up   Patient Goals and CMS Choice            Expected Discharge Plan and Services     Post Acute Care Choice: NA Living arrangements for the past 2 months: Single Family Home                                      Prior Living Arrangements/Services Living arrangements for the past 2 months: Single Family Home Lives with:: Roommate, Minor Children Patient language and need for interpreter reviewed:: Yes Do you feel safe going back to the place where you live?: Yes      Need for Family Participation in Patient Care: Yes (Comment) Care giver support system in place?: Yes (comment)   Criminal Activity/Legal Involvement Pertinent to Current Situation/Hospitalization: No - Comment as needed  Activities of Daily Living   ADL Screening (condition at time of admission) Independently performs ADLs?: Yes (appropriate for developmental age) Is the patient deaf or have difficulty hearing?: No Does the patient have  difficulty seeing, even when wearing glasses/contacts?: No Does the patient have difficulty concentrating, remembering, or making decisions?: No  Permission Sought/Granted                  Emotional Assessment Appearance:: Appears stated age Attitude/Demeanor/Rapport: Engaged, Gracious Affect (typically observed): Accepting, Appropriate, Calm, Pleasant Orientation: : Oriented to Self, Oriented to Place, Oriented to  Time, Oriented to Situation Alcohol / Substance Use: Not Applicable Psych Involvement: No (comment)  Admission diagnosis:  CHF exacerbation (HCC) [I50.9] Volume overload [E87.70] Acute on chronic congestive heart failure, unspecified heart failure type (HCC) [I50.9] Patient Active Problem List   Diagnosis Date Noted   Volume overload 01/03/2024   Chest pain 01/02/2024   Leg swelling 12/21/2023   HTN (hypertension) 11/22/2023   Chronic diastolic CHF (congestive heart failure) (HCC) 11/22/2023   Abnormal LFTs 11/22/2023   HCAP (healthcare-associated pneumonia) 11/22/2023   Generalized weakness 11/22/2023   Sepsis due to pneumonia (HCC) 11/13/2023   ABLA (acute blood loss anemia) 11/13/2023   Moderate protein malnutrition (HCC) 11/13/2023   Hyponatremia 11/13/2023   Norovirus 11/02/2023   Sepsis secondary to UTI (HCC) 10/31/2023   Sepsis due to urinary tract infection (HCC) 10/31/2023   Stage 3b chronic kidney disease (HCC) 10/25/2023   Depression, recurrent (HCC) 10/25/2023   Tinnitus of right ear 10/25/2023   Encounter for central line placement 10/10/2023   History of DVT (deep  vein thrombosis) 05/26/2023   Lactic acid acidosis 05/26/2023   Depression 04/07/2023   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   Malnutrition of moderate degree 03/24/2023   Type 1 diabetes mellitus with hyperosmolar hyperglycemic state (HHS) (HCC) 03/23/2023   Diabetes mellitus type 1 with hyperosmolarity (HCC) 03/23/2023   Anemia    Anemia in chronic kidney disease (CKD) 12/30/2022    AKI (acute kidney injury) (HCC) 11/22/2022   Dislodged jejunostomy tube 11/22/2022   Type 1 diabetes mellitus (HCC) 11/22/2022   DVT (deep venous thrombosis) (HCC) 11/20/2022   Failure to thrive in adult 11/13/2022   Hyperglycemia 11/13/2022   Intractable nausea and vomiting 11/13/2022   Normocytic anemia 11/12/2022   Severe recurrent major depression (HCC) 11/03/2022   CKD stage 3a, GFR 45-59 ml/min (HCC) 11/01/2022   Elevated serum creatinine 11/01/2022   Abdominal pain 11/01/2022   Cystitis 11/01/2022   Acute exacerbation of congestive heart failure (HCC) 08/23/2022   Diabetic ketoacidosis (HCC) 08/23/2022   Hypothermia 07/27/2022   CKD stage 3b, GFR 30-44 ml/min (HCC) 06/29/2022   Hypovolemic shock (HCC) 06/28/2022   Jejunostomy tube present (HCC) 06/28/2022   Insomnia 06/20/2022   Hypophosphatemia 06/13/2022   Severe malnutrition (HCC) 06/13/2022   Hypotension 06/10/2022   Pseudohyponatremia 06/10/2022   Hypomagnesemia 06/10/2022   Gastroenteritis 02/28/2022   Hypokalemia 02/28/2022   Hypocalcemia 02/28/2022   Metabolic acidosis 02/28/2022   Essential hypertension 02/28/2022   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   Nausea & vomiting 02/28/2022   High anion gap metabolic acidosis 01/02/2022   UTI (urinary tract infection) 11/03/2021   Hypoglycemia due to type 1 diabetes mellitus (HCC) 11/02/2021   Thrombocytopenia (HCC) 11/02/2021   Problem related to social environment 09/24/2021   Rhinovirus 09/22/2021   Acute on chronic HFrEF (heart failure with reduced ejection fraction) (HCC) 09/07/2021   Generalized anxiety disorder 09/01/2021   Pneumonia due to human metapneumovirus 08/31/2021   Gastro-esophageal reflux disease with esophagitis 08/31/2021   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   Congestive heart failure (HCC) 08/31/2021   Hypoglycemia due to insulin  06/14/2021   DKA (diabetic ketoacidosis) (HCC) 06/14/2021   CKD stage 3 due to type 1 diabetes  mellitus (HCC) 06/14/2021   Transaminitis 06/14/2021   Hypoglycemia associated with diabetes (HCC) 06/03/2021   DKA, type 1 (HCC) 05/20/2021   Major depressive disorder, recurrent episode, moderate (HCC) 05/08/2021   Right arm pain 05/07/2021   Rash of hand 05/07/2021   Hyperkalemia 05/06/2021   History of adrenal insufficiency 05/06/2021   CAP (community acquired pneumonia) 05/05/2021   Hypoglycemia 04/27/2021   Combined systolic and diastolic heart failure (HCC) 03/11/2021   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) 03/11/2021   Protein-calorie malnutrition, severe (HCC) 03/11/2021   Adrenal insufficiency (Addison's disease) (HCC) 03/10/2021   Postoperative hematoma involving genitourinary system following genitourinary procedure    S/P cesarean section 02/17/2021   Acute esophagitis    Leg edema    History of preterm delivery, currently pregnant    Pre-existing type 1 diabetes mellitus during pregnancy in third trimester    Anemia of chronic disease    Type 1 diabetes mellitus affecting pregnancy in second trimester, antepartum    HFrEF (heart failure with reduced ejection fraction) (HCC)    Acute deep vein thrombosis (DVT) of brachial vein of right upper extremity (HCC) 12/17/2020   Anxiety 12/17/2020   Diabetic retinopathy (HCC) 12/17/2020   Chronic combined systolic and diastolic CHF (congestive heart failure) (HCC)    Hyperemesis 12/13/2020   Type 1 diabetes  mellitus with hypoglycemia without coma (HCC) 12/13/2020   Sunburn 12/13/2020   Hypothyroidism 12/10/2020   Malnutrition (HCC) 12/10/2020   Vitreous hemorrhage of right eye (HCC) 12/10/2020   Pleural effusion associated with pulmonary infection 11/25/2020   Pneumonia affecting pregnancy 11/24/2020   Diabetes mellitus affecting pregnancy, second trimester 11/24/2020   Edema 11/24/2020   Decreased urine output 11/24/2020   Shortness of breath    Zinc  deficiency    SOB (shortness of  breath)    Social problem 11/21/2020   Nausea and vomiting during pregnancy prior to [redacted] weeks gestation 11/21/2020   Low serum vitamin B12    Delivery with history of C-section 11/20/2020   Absolute anemia    Acute kidney injury superimposed on chronic kidney disease (HCC)    Nausea and vomiting during pregnancy 10/23/2020   History of premature delivery, currently pregnant, second trimester 10/13/2020   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 [place] [address] Phone: [phone] Fax: [phone]     Social Drivers of Health (SDOH) Social History: SDOH Screenings   Food Insecurity: No Food Insecurity (01/03/2024)  Recent Concern: Food Insecurity - Food Insecurity Present (10/31/2023)  Housing: Low Risk  (01/03/2024)  Transportation Needs: No Transportation Needs (01/03/2024)  Recent Concern: Transportation Needs - Unmet Transportation Needs (10/31/2023)  Utilities: Not At Risk (01/03/2024)  Alcohol Screen: Low Risk  (12/21/2023)  Depression (PHQ2-9): High Risk (12/21/2023)  Financial Resource Strain: Low Risk  (08/04/2023)   Received from [facility]  Recent Concern: Financial Resource Strain - High Risk (05/09/2023)  Physical Activity: Unknown (05/09/2023)  Social Connections: Socially Isolated (01/03/2024)  Stress: Stress Concern Present (05/09/2023)  Tobacco Use: Low Risk  (01/02/2024)  Health Literacy: Adequate Health Literacy (12/21/2023)   SDOH Interventions:     Readmission Risk Interventions    01/04/2024   12:41 PM 11/15/2023    4:19 PM 11/04/2023   10:48 AM  Readmission Risk Prevention Plan   Transportation Screening Complete Complete Complete  Medication Review Oceanographer) Complete Complete Complete  PCP or Specialist appointment within 3-5 days of discharge Complete Complete Complete  HRI or Home Care Consult   Not Complete  HRI or Home Care Consult Pt Refusal Comments   n/a  SW Recovery Care/Counseling Consult Complete Complete Complete  Palliative Care Screening Not Applicable Not Applicable Not Applicable  Skilled Nursing Facility Not Applicable Not Applicable Not Applicable

## 2024-01-05 DIAGNOSIS — I509 Heart failure, unspecified: Secondary | ICD-10-CM

## 2024-01-05 DIAGNOSIS — I340 Nonrheumatic mitral (valve) insufficiency: Secondary | ICD-10-CM | POA: Diagnosis not present

## 2024-01-05 DIAGNOSIS — I5023 Acute on chronic systolic (congestive) heart failure: Secondary | ICD-10-CM | POA: Diagnosis not present

## 2024-01-05 LAB — BASIC METABOLIC PANEL WITH GFR
Anion gap: 15 (ref 5–15)
BUN: 76 mg/dL — ABNORMAL HIGH (ref 6–20)
CO2: 30 mmol/L (ref 22–32)
Calcium: 9.5 mg/dL (ref 8.9–10.3)
Chloride: 99 mmol/L (ref 98–111)
Creatinine, Ser: 2.26 mg/dL — ABNORMAL HIGH (ref 0.44–1.00)
GFR, Estimated: 29 mL/min — ABNORMAL LOW (ref >60)
Glucose, Bld: 85 mg/dL (ref 70–99)
Potassium: 3.1 mmol/L — ABNORMAL LOW (ref 3.5–5.1)
Sodium: 144 mmol/L (ref 135–145)

## 2024-01-05 LAB — GLUCOSE, CAPILLARY
Glucose-Capillary: 115 mg/dL — ABNORMAL HIGH (ref 70–99)
Glucose-Capillary: 146 mg/dL — ABNORMAL HIGH (ref 70–99)
Glucose-Capillary: 163 mg/dL — ABNORMAL HIGH (ref 70–99)
Glucose-Capillary: 198 mg/dL — ABNORMAL HIGH (ref 70–99)
Glucose-Capillary: 201 mg/dL — ABNORMAL HIGH (ref 70–99)
Glucose-Capillary: 285 mg/dL — ABNORMAL HIGH (ref 70–99)
Glucose-Capillary: 394 mg/dL — ABNORMAL HIGH (ref 70–99)
Glucose-Capillary: 424 mg/dL — ABNORMAL HIGH (ref 70–99)

## 2024-01-05 LAB — MRSA NEXT GEN BY PCR, NASAL: MRSA by PCR Next Gen: NOT DETECTED

## 2024-01-05 MED ORDER — ENOXAPARIN SODIUM 30 MG/0.3ML IJ SOSY
30.0000 mg | PREFILLED_SYRINGE | INTRAMUSCULAR | Status: DC
  Administered 2024-01-07: 30 mg via SUBCUTANEOUS
  Filled 2024-01-05: qty 0.3

## 2024-01-05 MED ORDER — SODIUM CHLORIDE 0.9% FLUSH
3.0000 mL | Freq: Two times a day (BID) | INTRAVENOUS | Status: DC
  Administered 2024-01-05: 3 mL via INTRAVENOUS

## 2024-01-05 MED ORDER — SODIUM CHLORIDE 0.9% FLUSH: 3.0000 mL | INTRAVENOUS | Status: DC | PRN

## 2024-01-05 MED ORDER — SODIUM CHLORIDE 0.9 % IV SOLN
250.0000 mL | INTRAVENOUS | Status: AC | PRN
Start: 2024-01-05 — End: 2024-01-06

## 2024-01-05 NOTE — H&P (View-Only) (Signed)
 Advanced Heart Failure Rounding Note  Cardiologist: Redell Cave, MD  Chief Complaint:  Subjective:    Now on lasix  gtt at 15. Getting multiple doses of albumin  by Nephrology.   Weight down 2 pounds. Diuresing well. SBP remains high 140-160. Scr 2.0 -> 2.2  K 3.1 Breathing better  cMRI with EF 35% RV 34% severe MR No LGE  Objective:    Weight Range: 58.7 kg Body mass index is 24.45 kg/m.   Vital Signs:   Temp:  [97.8 F (36.6 C)-98.3 F (36.8 C)] 97.8 F (36.6 C) (08/14 1149) Pulse Rate:  [69-93] 69 (08/14 1149) Resp:  [16-20] 16 (08/14 0425) BP: (141-160)/(74-91) 141/74 (08/14 1149) SpO2:  [99 %-100 %] 100 % (08/14 1149) Weight:  [58.7 kg] 58.7 kg (08/14 0500) Last BM Date : 01/03/24  Weight change: Filed Weights   01/02/24 1518 01/04/24 0500 01/05/24 0500  Weight: 59 kg 59.8 kg 58.7 kg   Intake/Output:  Intake/Output Summary (Last 24 hours) at 01/05/2024 1524 Last data filed at 01/05/2024 1411 Gross per 24 hour  Intake 941.74 ml  Output 2500 ml  Net -1558.26 ml    Physical Exam    General:  Pale appearing. No resp difficulty HEENT: normal + plethoric facies Neck: supple. JVP 8. Carotids 2+ bilat; no bruits. No lymphadenopathy or thryomegaly appreciated. Cor: PMI nondisplaced. Regular rate & rhythm. No rubs, gallops or murmurs. Lungs: clear Abdomen: soft, nontender, nondistended. No hepatosplenomegaly. No bruits or masses. Good bowel sounds. Extremities: no cyanosis, clubbing, rash, 1+ edema + TED Neuro: alert & orientedx3, cranial nerves grossly intact. moves all 4 extremities w/o difficulty. Affect pleasant   Telemetry   SR 60-70s Personally reviewed  Labs    CBC Recent Labs    01/02/24 1850 01/03/24 0650  WBC 9.9 7.2  HGB 11.8* 10.6*  HCT 37.0 32.1*  MCV 95.1 94.1  PLT 174 134*   Basic Metabolic Panel Recent Labs    91/86/74 0853 01/05/24 0703  NA 143 144  K 3.8 3.1*  CL 100 99  CO2 31 30  GLUCOSE 146* 85  BUN 59* 76*   CREATININE 1.99* 2.26*  CALCIUM  9.0 9.5  MG 1.8  --    Liver Function Tests Recent Labs    01/03/24 0650 01/03/24 1753  AST 12* 15  ALT 17 16  ALKPHOS 61 65  BILITOT 0.8 0.8  PROT 5.2* 6.0*  ALBUMIN  3.0* 3.5   BNP (last 3 results) Recent Labs    12/22/23 1534 12/26/23 1546 01/02/24 1730  BNP 4,235.7* >4,500.0* >4,500.0*   ProBNP (last 3 results) Recent Labs    12/21/23 1438  PROBNP >70000*    Imaging   No results found.  Medications:    Scheduled Medications:  divalproex   250 mg Oral q AM   [START ON 01/06/2024] enoxaparin  (LOVENOX ) injection  30 mg Subcutaneous Q24H   hydrALAZINE   10 mg Oral TID with meals   hydrocortisone   20 mg Oral Q breakfast   And   hydrocortisone   10 mg Oral Q supper   insulin  aspart  0-6 Units Subcutaneous Q4H   insulin  aspart  2 Units Subcutaneous TID WC   insulin  glargine-yfgn  8 Units Subcutaneous Daily   levothyroxine   75 mcg Oral Q0600   lipase/protease/amylase  24,000 Units Oral TID AC   losartan   12.5 mg Oral Daily   metoprolol  succinate  25 mg Oral Daily   rosuvastatin   10 mg Oral Daily   sodium chloride  flush  3  mL Intravenous Q12H   spironolactone   25 mg Oral Daily    Infusions:  sodium chloride      albumin  human Stopped (01/05/24 1035)   furosemide  (LASIX ) 200 mg in dextrose  5 % 100 mL (2 mg/mL) infusion 15 mg/hr (01/05/24 1244)    PRN Medications: sodium chloride , acetaminophen  **OR** acetaminophen , HYDROcodone -acetaminophen , ondansetron  **OR** ondansetron  (ZOFRAN ) IV, sodium chloride  flush  Patient Profile   Ashley Camacho is a 33 y.o. female with history of type 1 diabetes, CKD stage 3/diabetic nephropathy, diabetic gastroparesis, adrenal insufficiency, and CHF. Admitted with chest pressure, found to have A/C HFimpEF.   Assessment/Plan   1. Chronic HF with improved EF: Patient has history of nonischemic cardiomyopathy that was primarily followed at [facility] in the past.  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  Echo in 7/22 after the birth of her second daughter showed EF 40-45%.  This had declined to 25% by 2/23, ?peripartum CMP.  Cath in 2/23 at [facility] showed no significant CAD.  However, echo in 6/25 at [facility] was reviewed and noted to be relatively normal except for mild to moderate MR => EF 55-60%, GLS -23.4%, normal diastolic function, RV normal, mild-moderate MR, IVC normal.  Despite the unremarkable echo, she has struggled with volume overload recently. Urine protein/creatinine ratio and albumen level are not consistent with nephrotic syndrome as an explanation for her volume overload. Elevated troponin without trend likely related to demand ischemia from volume overload.  - cMRI 01/04/24 with EF 35% RV 34% severe MR No LGE - Now on lasix  gtt at 15/hr + daily metolazone . Diuresis picking up - volume status improved. Will continue one more day - GDMT will be limited by CKD and upper normal K.  - continue spironolactone  to 25 mg daily.  - Continue Toprol  25 - Continue losartan  - She is not a candidate for SGLT2 inhibitor with type 1 diabetes.  - TED hose until UNNA boots are able to be placed - She has a strong family history of premature CAD (father) and type 1 diabetes.  She had a cath in 2/23 without significant CAD. Her current symptoms appear related to volume overload. She should get eventual noninvasive ischemic evaluation, but would hold off on coronary angiography for now with elevated creatinine and no ACS.  2. Severe MR - likely functional - Dr. Rolan planning TEE tomorrow 3. Hyperlipidemia: continue crestor  10 mg daily.  4. CKD stage IV: Diabetic nephropathy.  - Follow creatinine closely with diuresis. Renal following 5 Type 1 diabetes: Continue insulin  per primary team. Reduce insulin  while NPO for cath.  6. HTN: BP elevated. Will not push ARB today given bump in Cr. Continue to follow 7. Anemia: Suspect Fe deficiency due to chronic disease. Hgb 10.5, pale. Fe  levels ok 8. Hypokalemia: Supp   Length of Stay: 2  Toribio Fuel, MD  01/05/2024, 3:24 PM  Advanced Heart Failure Team Pager [phone] (M-F; 7a - 5p)  Please contact [facility] for night-coverage after hours (5p -7a ) and weekends on [email or web address]

## 2024-01-05 NOTE — Plan of Care (Signed)
   Problem: Education: Goal: Ability to describe self-care measures that may prevent or decrease complications (Diabetes Survival Skills Education) will improve Outcome: Progressing   Problem: Coping: Goal: Ability to adjust to condition or change in health will improve Outcome: Progressing   Problem: Fluid Volume: Goal: Ability to maintain a balanced intake and output will improve Outcome: Progressing   Problem: Health Behavior/Discharge Planning: Goal: Ability to identify and utilize available resources and services will improve Outcome: Progressing Goal: Ability to manage health-related needs will improve Outcome: Progressing   Problem: Metabolic: Goal: Ability to maintain appropriate glucose levels will improve Outcome: Progressing   Problem: Nutritional: Goal: Maintenance of adequate nutrition will improve Outcome: Progressing Goal: Progress toward achieving an optimal weight will improve Outcome: Progressing   Problem: Skin Integrity: Goal: Risk for impaired skin integrity will decrease Outcome: Progressing   Problem: Tissue Perfusion: Goal: Adequacy of tissue perfusion will improve Outcome: Progressing   Problem: Education: Goal: Knowledge of General Education information will improve Description: Including pain rating scale, medication(s)/side effects and non-pharmacologic comfort measures Outcome: Progressing   Problem: Health Behavior/Discharge Planning: Goal: Ability to manage health-related needs will improve Outcome: Progressing

## 2024-01-05 NOTE — Progress Notes (Signed)
 Progress Note   Patient: Ashley Camacho FMW:[id number] DOB: 1990-09-23 DOA: 01/02/2024     2 DOS: the patient was seen and examined on 01/05/2024   Brief hospital course: 33yo with h/o T1DM, stage 3b CKD, 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  Cardiology consulted, treated with Lasix .  Cardiac MRI with EF 35%, global hypokinesis, concern for severe MR.  Cardiology is planning TEE and probable heart cath.  Assessment and Plan:  Acute on chronic heart failure with reduced EF Nonischemic cardiomyopathy/peripartum cardiomyopathy - EF has been widely variable over the years.  Down to 25% in 2023, resolved towards 55% in June 2025, back to 35% (possibly over-called in recent years) -- Presented with volume overload in the setting of presumed acute on chronic heart failure exacerbation though EF was preserved with normal diastolic parameters in 2025 -- Heart failure team consulted, appreciate recommendations - Continue with GDMT for now - Continue with Lasix , changed from bolus to drip by nephrology - UPCR is elevated at 2.33, though not technically nephrotic this may be playing a role - Have consulted nephrology who has recommended albumin  25 mg every 12 x 72 hours, appreciate further recommendations - Strict I's and O's ordered - MRI ordered by heart failure team, shows EF 35% with global hypokinesis and concern for severe MR - Not a candidate for SGLT2 due to T1DM - Cardiac cath in 2023 without significant CAD - Continue with Unna boots -- Plan for TEE and possible subsequent cath per heart failure team   Chest pain, resolved - Elevated troponin likely secondary to demand ischemia given volume overload - Monitor troponins, unlikely ACS - Negative left heart cath  previously -- Further workup per cardiology   Type 1 diabetes - Continue basal/bolus, titrate up as tolerated - No longer on insulin  pump outpatient -- Last A1c in 09/2023 was 8, reasonable but not optimal control   Gastroparesis, severe - Recently required J-tube, removed in January - Continue home meds   CKD stage IIIb - Baseline creatinine appears to be close to 1.9, slightly worse today to 2.26 - UPCR 2.33, some concern for nephrotic syndrome though not technically at diagnostic criteria - Albumin  is 3.0 - Nephrology has been consulted, currently recommending albumin  25 mg every 8 hours to optimize fluid removal -- Nephrology has also transitioned her to Lasix  drip rather than bolus Lasix  -- If creatinine is not improving, may need to lower Lasix  drip dosage   Anemia of chronic disease - Hemoglobin at baseline on presentation -- Recheck CBC in AM   Hypothyroidism - Continue levothyroxine    Adrenal insufficiency - Continue with current dose hydrocortisone .   Hyperlipidemia - Continue Crestor        Consultants: Cardiology DM coordinator [facility] team   Procedures: Cardiac MRI   Antibiotics: None  30 Day Unplanned Readmission Risk Score    Flowsheet Row ED to Hosp-Admission (Current) from 01/02/2024 in [facility] PCU  30 Day Unplanned Readmission Risk Score (%) 43.05 Filed at 01/05/2024 0801    This score is the patient's risk of an unplanned readmission within 30 days of being discharged (0 -100%). The score is based on dignosis, age, lab data, medications, orders, and past utilization.   Low:  0-14.9   Medium: 15-21.9   High: 22-29.9   Extreme: 30 and above  Subjective: Reports feeling some better.  She is concerned about what her future will look like and misses being home with her children, but is in agreement with the evaluation being performed.   Objective: Vitals:   01/05/24 0831 01/05/24 1149  BP: (!) 145/80 (!) 141/74   Pulse: 71 69  Resp:    Temp: 97.9 F (36.6 C) 97.8 F (36.6 C)  SpO2: 100% 100%    Intake/Output Summary (Last 24 hours) at 01/05/2024 1523 Last data filed at 01/05/2024 1411 Gross per 24 hour  Intake 941.74 ml  Output 2500 ml  Net -1558.26 ml   Filed Weights   01/02/24 1518 01/04/24 0500 01/05/24 0500  Weight: 59 kg 59.8 kg 58.7 kg    Exam:  General:  Appears calm and comfortable and is in NAD Eyes:   normal lids, iris ENT:  grossly normal hearing, lips & tongue, mmm Cardiovascular:  RRR. Scant LE edema., TED hose in place Respiratory:   CTA bilaterally with no wheezes/rales/rhonchi.  Normal respiratory effort. Abdomen:  soft, NT, ND Skin:  no rash or induration seen on limited exam Musculoskeletal:  grossly normal tone BUE/BLE, good ROM, no bony abnormality Psychiatric:  grossly normal mood and affect, speech fluent and appropriate, AOx3 Neurologic:  CN 2-12 grossly intact, moves all extremities in coordinated fashion  Data Reviewed: I have reviewed the patient's lab results since admission.  Pertinent labs for today include:   K+ 3.1 BUN 76/Creatinine 2.26/GFR 29     Family Communication: None present  Disposition: Status is: Inpatient Remains inpatient appropriate because: ongoing management     Time spent: 50 minutes  Unresulted Labs (From admission, onward)     Start     Ordered   01/06/24 0500  CBC  Tomorrow morning,   R       Question:  Specimen collection method  Answer:  Lab=Lab collect   01/05/24 1341   01/04/24 0752  Basic metabolic panel  Daily,   R     Question:  Specimen collection method  Answer:  Lab=Lab collect   01/04/24 0751             Author: Delon Herald, MD 01/05/2024 3:23 PM  For on call review [email or web address].

## 2024-01-05 NOTE — Progress Notes (Addendum)
 Advanced Heart Failure Rounding Note  Cardiologist: Redell Cave, MD  Chief Complaint:  Subjective:    Now on lasix  gtt at 15. Getting multiple doses of albumin  by Nephrology.   Weight down 2 pounds. Diuresing well. SBP remains high 140-160. Scr 2.0 -> 2.2  K 3.1 Breathing better  cMRI with EF 35% RV 34% severe MR No LGE  Objective:    Weight Range: 58.7 kg Body mass index is 24.45 kg/m.   Vital Signs:   Temp:  [97.8 F (36.6 C)-98.3 F (36.8 C)] 97.8 F (36.6 C) (08/14 1149) Pulse Rate:  [69-93] 69 (08/14 1149) Resp:  [16-20] 16 (08/14 0425) BP: (141-160)/(74-91) 141/74 (08/14 1149) SpO2:  [99 %-100 %] 100 % (08/14 1149) Weight:  [58.7 kg] 58.7 kg (08/14 0500) Last BM Date : 01/03/24  Weight change: Filed Weights   01/02/24 1518 01/04/24 0500 01/05/24 0500  Weight: 59 kg 59.8 kg 58.7 kg   Intake/Output:  Intake/Output Summary (Last 24 hours) at 01/05/2024 1524 Last data filed at 01/05/2024 1411 Gross per 24 hour  Intake 941.74 ml  Output 2500 ml  Net -1558.26 ml    Physical Exam    General:  Pale appearing. No resp difficulty HEENT: normal + plethoric facies Neck: supple. JVP 8. Carotids 2+ bilat; no bruits. No lymphadenopathy or thryomegaly appreciated. Cor: PMI nondisplaced. Regular rate & rhythm. No rubs, gallops or murmurs. Lungs: clear Abdomen: soft, nontender, nondistended. No hepatosplenomegaly. No bruits or masses. Good bowel sounds. Extremities: no cyanosis, clubbing, rash, 1+ edema + TED Neuro: alert & orientedx3, cranial nerves grossly intact. moves all 4 extremities w/o difficulty. Affect pleasant   Telemetry   SR 60-70s Personally reviewed  Labs    CBC Recent Labs    01/02/24 1850 01/03/24 0650  WBC 9.9 7.2  HGB 11.8* 10.6*  HCT 37.0 32.1*  MCV 95.1 94.1  PLT 174 134*   Basic Metabolic Panel Recent Labs    91/86/74 0853 01/05/24 0703  NA 143 144  K 3.8 3.1*  CL 100 99  CO2 31 30  GLUCOSE 146* 85  BUN 59* 76*   CREATININE 1.99* 2.26*  CALCIUM  9.0 9.5  MG 1.8  --    Liver Function Tests Recent Labs    01/03/24 0650 01/03/24 1753  AST 12* 15  ALT 17 16  ALKPHOS 61 65  BILITOT 0.8 0.8  PROT 5.2* 6.0*  ALBUMIN  3.0* 3.5   BNP (last 3 results) Recent Labs    12/22/23 1534 12/26/23 1546 01/02/24 1730  BNP 4,235.7* >4,500.0* >4,500.0*   ProBNP (last 3 results) Recent Labs    12/21/23 1438  PROBNP >70000*    Imaging   No results found.  Medications:    Scheduled Medications:  divalproex   250 mg Oral q AM   [START ON 01/06/2024] enoxaparin  (LOVENOX ) injection  30 mg Subcutaneous Q24H   hydrALAZINE   10 mg Oral TID with meals   hydrocortisone   20 mg Oral Q breakfast   And   hydrocortisone   10 mg Oral Q supper   insulin  aspart  0-6 Units Subcutaneous Q4H   insulin  aspart  2 Units Subcutaneous TID WC   insulin  glargine-yfgn  8 Units Subcutaneous Daily   levothyroxine   75 mcg Oral Q0600   lipase/protease/amylase  24,000 Units Oral TID AC   losartan   12.5 mg Oral Daily   metoprolol  succinate  25 mg Oral Daily   rosuvastatin   10 mg Oral Daily   sodium chloride  flush  3  mL Intravenous Q12H   spironolactone   25 mg Oral Daily    Infusions:  sodium chloride      albumin  human Stopped (01/05/24 1035)   furosemide  (LASIX ) 200 mg in dextrose  5 % 100 mL (2 mg/mL) infusion 15 mg/hr (01/05/24 1244)    PRN Medications: sodium chloride , acetaminophen  **OR** acetaminophen , HYDROcodone -acetaminophen , ondansetron  **OR** ondansetron  (ZOFRAN ) IV, sodium chloride  flush  Patient Profile   Ashley Camacho is a 33 y.o. female with history of type 1 diabetes, CKD stage 3/diabetic nephropathy, diabetic gastroparesis, adrenal insufficiency, and CHF. Admitted with chest pressure, found to have A/C HFimpEF.   Assessment/Plan   1. Chronic HF with improved EF: Patient has history of nonischemic cardiomyopathy that was primarily followed at [facility] in the past.  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  Echo in 7/22 after the birth of her second daughter showed EF 40-45%.  This had declined to 25% by 2/23, ?peripartum CMP.  Cath in 2/23 at [facility] showed no significant CAD.  However, echo in 6/25 at [facility] was reviewed and noted to be relatively normal except for mild to moderate MR => EF 55-60%, GLS -23.4%, normal diastolic function, RV normal, mild-moderate MR, IVC normal.  Despite the unremarkable echo, she has struggled with volume overload recently. Urine protein/creatinine ratio and albumen level are not consistent with nephrotic syndrome as an explanation for her volume overload. Elevated troponin without trend likely related to demand ischemia from volume overload.  - cMRI 01/04/24 with EF 35% RV 34% severe MR No LGE - Now on lasix  gtt at 15/hr + daily metolazone . Diuresis picking up - volume status improved. Will continue one more day - GDMT will be limited by CKD and upper normal K.  - continue spironolactone  to 25 mg daily.  - Continue Toprol  25 - Continue losartan  - She is not a candidate for SGLT2 inhibitor with type 1 diabetes.  - TED hose until UNNA boots are able to be placed - She has a strong family history of premature CAD (father) and type 1 diabetes.  She had a cath in 2/23 without significant CAD. Her current symptoms appear related to volume overload. She should get eventual noninvasive ischemic evaluation, but would hold off on coronary angiography for now with elevated creatinine and no ACS.  2. Severe MR - likely functional - Dr. Rolan planning TEE tomorrow 3. Hyperlipidemia: continue crestor  10 mg daily.  4. CKD stage IV: Diabetic nephropathy.  - Follow creatinine closely with diuresis. Renal following 5 Type 1 diabetes: Continue insulin  per primary team. Reduce insulin  while NPO for cath.  6. HTN: BP elevated. Will not push ARB today given bump in Cr. Continue to follow 7. Anemia: Suspect Fe deficiency due to chronic disease. Hgb 10.5, pale. Fe  levels ok 8. Hypokalemia: Supp   Length of Stay: 2  Toribio Fuel, MD  01/05/2024, 3:24 PM  Advanced Heart Failure Team Pager [phone] (M-F; 7a - 5p)  Please contact [facility] for night-coverage after hours (5p -7a ) and weekends on [email or web address]

## 2024-01-05 NOTE — Progress Notes (Signed)
 Pt currently in bilateral Ted hose with [employer] order. Supplies for [employer] on unit, however per [employer], Wound consult required for placement. STAT WOC placed. Pt to remain in ted hose until Unna boots can be placed.

## 2024-01-05 NOTE — Progress Notes (Signed)
 [facility]  ROUNDING NOTE   Subjective:   Patient resting quietly in bed. Remains on room air Extremity edema improved  Creatinine 2.26 Urine output 1.9 L recorded in previous 24 hours  Objective:  Vital signs in last 24 hours:  Temp:  [97.8 F (36.6 C)-98.3 F (36.8 C)] 97.8 F (36.6 C) (08/14 1149) Pulse Rate:  [69-93] 69 (08/14 1149) Resp:  [16-20] 16 (08/14 0425) BP: (141-160)/(74-91) 141/74 (08/14 1149) SpO2:  [99 %-100 %] 100 % (08/14 1149) Weight:  [58.7 kg] 58.7 kg (08/14 0500)  Weight change: -1.1 kg Filed Weights   01/02/24 1518 01/04/24 0500 01/05/24 0500  Weight: 59 kg 59.8 kg 58.7 kg    Intake/Output: I/O last 3 completed shifts: In: 455.7 [P.O.:50; I.V.:102.8; IV Piggyback:303] Out: 2700 [Urine:2700]   Intake/Output this shift:  Total I/O In: -  Out: 1200 [Urine:1200]  Physical Exam: General: NAD  Head: Normocephalic, atraumatic. Moist oral mucosal membranes  Eyes: Anicteric  Neck: Supple  Lungs:  Clear to auscultation, room air  Heart: Regular rate and rhythm  Abdomen:  Soft, nontender  Extremities: Trace peripheral edema.  Neurologic: Awake, alert, conversant  Skin: Warm,dry, no rash       Basic Metabolic Panel: Recent Labs  Lab 01/02/24 1730 01/03/24 0650 01/03/24 1753 01/04/24 0853 01/05/24 0703  NA 145 146* 143 143 144  K 4.0 3.7 3.9 3.8 3.1*  CL 108 107 105 100 99  CO2 26 27 27 31 30   GLUCOSE 98 81 134* 146* 85  BUN 59* 57* 54* 59* 76*  CREATININE 1.90* 1.84* 2.08* 1.99* 2.26*  CALCIUM  8.3* 8.3* 8.6* 9.0 9.5  MG  --   --   --  1.8  --     Liver Function Tests: Recent Labs  Lab 01/03/24 0650 01/03/24 1753  AST 12* 15  ALT 17 16  ALKPHOS 61 65  BILITOT 0.8 0.8  PROT 5.2* 6.0*  ALBUMIN  3.0* 3.5   No results for input(s): LIPASE, AMYLASE in the last 168 hours. No results for input(s): AMMONIA in the last 168 hours.  CBC: Recent Labs  Lab 01/02/24 1850 01/03/24 0650  WBC 9.9 7.2  HGB  11.8* 10.6*  HCT 37.0 32.1*  MCV 95.1 94.1  PLT 174 134*    Cardiac Enzymes: No results for input(s): CKTOTAL, CKMB, CKMBINDEX, TROPONINI in the last 168 hours.  BNP: Invalid input(s): POCBNP  CBG: Recent Labs  Lab 01/04/24 2031 01/05/24 0047 01/05/24 0426 01/05/24 0832 01/05/24 1151  GLUCAP 343* 201* 163* 115* 198*    Microbiology: Results for orders placed or performed in visit on 12/21/23  Microscopic Examination     Status: None   Collection Time: 12/21/23  2:38 PM  Result Value Ref Range Status   WBC, UA 0-5 0 - 5 /hpf Final   RBC, Urine None seen 0 - 2 /hpf Final   Epithelial Cells (non renal) 0-10 0 - 10 /hpf Final   Casts None seen None seen /lpf Final   Bacteria, UA None seen None seen/Few Final    Coagulation Studies: No results for input(s): LABPROT, INR in the last 72 hours.  Urinalysis: Recent Labs    01/02/24 2340  COLORURINE COLORLESS*  LABSPEC 1.006  PHURINE 6.0  GLUCOSEU 50*  HGBUR SMALL*  BILIRUBINUR NEGATIVE  KETONESUR NEGATIVE  PROTEINUR 30*  NITRITE NEGATIVE  LEUKOCYTESUR NEGATIVE      Imaging: MR CARDIAC MORPHOLOGY W WO CONTRAST Result Date: 01/04/2024 CLINICAL DATA:  CHF, cardiomyopathy EXAM: CARDIAC  MRI TECHNIQUE: The patient was scanned on a 1.5 Tesla GE magnet. A dedicated cardiac coil was used. Functional imaging was done using Fiesta sequences. 2,3, and 4 chamber views were done to assess for RWMA's. Modified Simpson's rule using a short axis stack was used to calculate an ejection fraction on a dedicated work [employer], [employer]. The patient received 8 cc of Gadavist . After 10 minutes inversion recovery sequences were used to assess for infiltration and scar tissue. FINDINGS: Limited images of the lung fields show moderate right, small left pleural effusion. Small circumferential pericardial effusion. Mildly dilated left ventricular size with normal wall thickness. Global hypokinesis with LV EF 35%. Normal  RV size with RV EF 34%. Mild biatrial enlargement. Trileaflet aortic valve, no significant stenosis, mild aortic insufficiency (regurgitant fraction 12%). Mitral regurgitant fraction 45% suggests severe mitral regurgitation. On delayed enhancement imaging, there was no myocardial late gadolinium enhancement (LGE) noted. MEASUREMENTS: MEASUREMENTS LVEDV 157 mL LVEDVi 98 mL/m2 LVSV 55 mL LVEF 35% RVEDV 153 mL RVEDVi 95 mL/m2 RVSV 53 mL RVEF 34% Aortic forward volume 30 mL Aortic regurgitant fraction 12% Global T1 1113, ECV 28% Global T2 57, mildly elevated IMPRESSION: 1.  Bilateral pleural effusions, R>L. 2.  Small pericardial effusion. 3.  Mildly dilated LV with LV EF 35%, global hypokinesis. 4.  Normal RV size with RV EF 34%. 5. No myocardial LGE, so no definitive evidence for prior MI, infiltrative disease, or myocarditis. 6. Normal extracellular volume percentage but T2 mildly elevated. Significance uncertain. 7.  Possible severe mitral regurgitation, regurgitant fraction 45%. Dalton Mclean Electronically Signed   By: Ezra Shuck M.D.   On: 01/04/2024 13:03   MR CARDIAC VELOCITY FLOW MAP Result Date: 01/04/2024 CLINICAL DATA:  CHF, cardiomyopathy EXAM: CARDIAC MRI TECHNIQUE: The patient was scanned on a 1.5 Tesla GE magnet. A dedicated cardiac coil was used. Functional imaging was done using Fiesta sequences. 2,3, and 4 chamber views were done to assess for RWMA's. Modified Simpson's rule using a short axis stack was used to calculate an ejection fraction on a dedicated work [employer] officer, [employer]. The patient received 8 cc of Gadavist . After 10 minutes inversion recovery sequences were used to assess for infiltration and scar tissue. FINDINGS: Limited images of the lung fields show moderate right, small left pleural effusion. Small circumferential pericardial effusion. Mildly dilated left ventricular size with normal wall thickness. Global hypokinesis with LV EF 35%. Normal RV size with RV EF 34%.  Mild biatrial enlargement. Trileaflet aortic valve, no significant stenosis, mild aortic insufficiency (regurgitant fraction 12%). Mitral regurgitant fraction 45% suggests severe mitral regurgitation. On delayed enhancement imaging, there was no myocardial late gadolinium enhancement (LGE) noted. MEASUREMENTS: MEASUREMENTS LVEDV 157 mL LVEDVi 98 mL/m2 LVSV 55 mL LVEF 35% RVEDV 153 mL RVEDVi 95 mL/m2 RVSV 53 mL RVEF 34% Aortic forward volume 30 mL Aortic regurgitant fraction 12% Global T1 1113, ECV 28% Global T2 57, mildly elevated IMPRESSION: 1.  Bilateral pleural effusions, R>L. 2.  Small pericardial effusion. 3.  Mildly dilated LV with LV EF 35%, global hypokinesis. 4.  Normal RV size with RV EF 34%. 5. No myocardial LGE, so no definitive evidence for prior MI, infiltrative disease, or myocarditis. 6. Normal extracellular volume percentage but T2 mildly elevated. Significance uncertain. 7.  Possible severe mitral regurgitation, regurgitant fraction 45%. Dalton Mclean Electronically Signed   By: Ezra Shuck M.D.   On: 01/04/2024 13:03   MR CARDIAC VELOCITY FLOW MAP Result Date: 01/04/2024 CLINICAL DATA:  CHF, cardiomyopathy  EXAM: CARDIAC MRI TECHNIQUE: The patient was scanned on a 1.5 Tesla GE magnet. A dedicated cardiac coil was used. Functional imaging was done using Fiesta sequences. 2,3, and 4 chamber views were done to assess for RWMA's. Modified Simpson's rule using a short axis stack was used to calculate an ejection fraction on a dedicated work [employer] officer, trade [employer]. The patient received 8 cc of Gadavist . After 10 minutes inversion recovery sequences were used to assess for infiltration and scar tissue. FINDINGS: Limited images of the lung fields show moderate right, small left pleural effusion. Small circumferential pericardial effusion. Mildly dilated left ventricular size with normal wall thickness. Global hypokinesis with LV EF 35%. Normal RV size with RV EF 34%. Mild biatrial enlargement.  Trileaflet aortic valve, no significant stenosis, mild aortic insufficiency (regurgitant fraction 12%). Mitral regurgitant fraction 45% suggests severe mitral regurgitation. On delayed enhancement imaging, there was no myocardial late gadolinium enhancement (LGE) noted. MEASUREMENTS: MEASUREMENTS LVEDV 157 mL LVEDVi 98 mL/m2 LVSV 55 mL LVEF 35% RVEDV 153 mL RVEDVi 95 mL/m2 RVSV 53 mL RVEF 34% Aortic forward volume 30 mL Aortic regurgitant fraction 12% Global T1 1113, ECV 28% Global T2 57, mildly elevated IMPRESSION: 1.  Bilateral pleural effusions, R>L. 2.  Small pericardial effusion. 3.  Mildly dilated LV with LV EF 35%, global hypokinesis. 4.  Normal RV size with RV EF 34%. 5. No myocardial LGE, so no definitive evidence for prior MI, infiltrative disease, or myocarditis. 6. Normal extracellular volume percentage but T2 mildly elevated. Significance uncertain. 7.  Possible severe mitral regurgitation, regurgitant fraction 45%. Dalton Mclean Electronically Signed   By: Ezra Shuck M.D.   On: 01/04/2024 13:03     Medications:    albumin  human 25 g (01/05/24 0918)   furosemide  (LASIX ) 200 mg in dextrose  5 % 100 mL (2 mg/mL) infusion 15 mg/hr (01/05/24 0417)    divalproex   250 mg Oral q AM   enoxaparin  (LOVENOX ) injection  40 mg Subcutaneous Q24H   hydrALAZINE   10 mg Oral TID with meals   hydrocortisone   20 mg Oral Q breakfast   And   hydrocortisone   10 mg Oral Q supper   insulin  aspart  0-6 Units Subcutaneous Q4H   insulin  aspart  2 Units Subcutaneous TID WC   insulin  glargine-yfgn  8 Units Subcutaneous Daily   levothyroxine   75 mcg Oral Q0600   lipase/protease/amylase  24,000 Units Oral TID AC   losartan   12.5 mg Oral Daily   metoprolol  succinate  25 mg Oral Daily   rosuvastatin   10 mg Oral Daily   spironolactone   25 mg Oral Daily   acetaminophen  **OR** acetaminophen , HYDROcodone -acetaminophen , ondansetron  **OR** ondansetron  (ZOFRAN ) IV  Assessment/ Plan:  Ms. AMALYA SALMONS is a 33  y.o.  female with past medical history of T1DM, CHF, hypertension, gastroparesis,a nd chronic kidney disease stage IIIb, who was admitted to [facility] on 01/02/2024 for CHF exacerbation (HCC) [I50.9] Volume overload [E87.70] Acute on chronic congestive heart failure, unspecified heart failure type (HCC) [I50.9]   Chronic diastolic heart failure with volume overload. ECHO from 11/02/23 shows EF 55-60 % Chest x-ray negative for acute findings.. Cardiology has increased to IV furosemide  15mg /hr. Continue IV albumin  25g to three times daily to optimize fluid removal. Will defer angiogram due to renal function.  2. Chronic kidney disease stage IIIb.  Baseline creatinine appears to be 1.7-1.8.  Creatinine increased with furosemide  drip. Will continue to monitor.  Lab Results  Component Value Date   CREATININE 2.26 (H)  01/05/2024   CREATININE 1.99 (H) 01/04/2024   CREATININE 2.08 (H) 01/03/2024    Intake/Output Summary (Last 24 hours) at 01/05/2024 1208 Last data filed at 01/05/2024 1152 Gross per 24 hour  Intake 405.7 ml  Output 3100 ml  Net -2694.3 ml     3. Diabetes mellitus type I with chronic kidney disease/renal manifestations: insulin  dependent. Home regimen includes Novolog  and Lantus . Most recent hemoglobin A1c is 8.0 on 10/09/23.   Glucose elevated at times.   4. Anemia of chronic kidney disease Lab Results  Component Value Date   HGB 10.6 (L) 01/03/2024    Hgb at goal  5. Hypertension with chronic kidney disease. Home regimen includes furosemide , metoprolol  and spironolactone . Blood pressure 141/74   LOS: 2 Aasha Dina 8/14/202512:08 PM

## 2024-01-06 ENCOUNTER — Encounter
Admission: EM | Disposition: A | Discharge status: Home / Self Care | Payer: Self-pay | Source: Home / Self Care | Attending: Internal Medicine

## 2024-01-06 ENCOUNTER — Inpatient Hospital Stay (HOSPITAL_COMMUNITY)
Admit: 2024-01-06 | Discharge: 2024-01-06 | Disposition: A | Discharge status: Home / Self Care | Attending: Cardiology | Admitting: Cardiology

## 2024-01-06 ENCOUNTER — Inpatient Hospital Stay

## 2024-01-06 ENCOUNTER — Inpatient Hospital Stay: Admit: 2024-01-06

## 2024-01-06 DIAGNOSIS — I5023 Acute on chronic systolic (congestive) heart failure: Secondary | ICD-10-CM | POA: Diagnosis not present

## 2024-01-06 DIAGNOSIS — I10 Essential (primary) hypertension: Secondary | ICD-10-CM | POA: Diagnosis not present

## 2024-01-06 DIAGNOSIS — I340 Nonrheumatic mitral (valve) insufficiency: Secondary | ICD-10-CM

## 2024-01-06 DIAGNOSIS — E785 Hyperlipidemia, unspecified: Secondary | ICD-10-CM | POA: Diagnosis not present

## 2024-01-06 HISTORY — PX: TEE WITHOUT CARDIOVERSION: SHX5443

## 2024-01-06 LAB — BASIC METABOLIC PANEL WITH GFR
Anion gap: 17 — ABNORMAL HIGH (ref 5–15)
BUN: 109 mg/dL — ABNORMAL HIGH (ref 6–20)
CO2: 30 mmol/L (ref 22–32)
Calcium: 9.3 mg/dL (ref 8.9–10.3)
Chloride: 94 mmol/L — ABNORMAL LOW (ref 98–111)
Creatinine, Ser: 3.15 mg/dL — ABNORMAL HIGH (ref 0.44–1.00)
GFR, Estimated: 19 mL/min — ABNORMAL LOW (ref >60)
Glucose, Bld: 348 mg/dL — ABNORMAL HIGH (ref 70–99)
Potassium: 3.5 mmol/L (ref 3.5–5.1)
Sodium: 141 mmol/L (ref 135–145)

## 2024-01-06 LAB — GLUCOSE, CAPILLARY
Glucose-Capillary: 339 mg/dL — ABNORMAL HIGH (ref 70–99)
Glucose-Capillary: 208 mg/dL — ABNORMAL HIGH (ref 70–99)
Glucose-Capillary: 25 mg/dL — CL (ref 70–99)
Glucose-Capillary: 188 mg/dL — ABNORMAL HIGH (ref 70–99)
Glucose-Capillary: 25 mg/dL — CL (ref 70–99)
Glucose-Capillary: 199 mg/dL — ABNORMAL HIGH (ref 70–99)
Glucose-Capillary: 116 mg/dL — ABNORMAL HIGH (ref 70–99)
Glucose-Capillary: 298 mg/dL — ABNORMAL HIGH (ref 70–99)

## 2024-01-06 LAB — CBC
HCT: 32.3 % — ABNORMAL LOW (ref 36.0–46.0)
Hemoglobin: 10.6 g/dL — ABNORMAL LOW (ref 12.0–15.0)
MCH: 30.3 pg (ref 26.0–34.0)
MCHC: 32.8 g/dL (ref 30.0–36.0)
MCV: 92.3 fL (ref 80.0–100.0)
Platelets: 165 K/uL (ref 150–400)
RBC: 3.5 MIL/uL — ABNORMAL LOW (ref 3.87–5.11)
RDW: 15.5 % (ref 11.5–15.5)
WBC: 9.4 K/uL (ref 4.0–10.5)
nRBC: 0 % (ref 0.0–0.2)

## 2024-01-06 LAB — ECHO TEE

## 2024-01-06 SURGERY — ECHOCARDIOGRAM, TRANSESOPHAGEAL
Anesthesia: General

## 2024-01-06 MED ORDER — DEXTROSE 50 % IV SOLN: 1.0000 | Freq: Once | INTRAVENOUS | Status: DC

## 2024-01-06 MED ORDER — LIDOCAINE VISCOUS HCL 2 % MT SOLN
OROMUCOSAL | Status: AC
Start: 2024-01-06 — End: 2024-01-07
  Filled 2024-01-06: qty 15

## 2024-01-06 MED ORDER — ISOSORBIDE MONONITRATE ER 30 MG PO TB24
15.0000 mg | ORAL_TABLET | Freq: Every day | ORAL | Status: DC
  Administered 2024-01-07: 15 mg via ORAL
  Filled 2024-01-06: qty 1

## 2024-01-06 MED ORDER — DEXTROSE 50 % IV SOLN
INTRAVENOUS | Status: AC
Start: 2024-01-06 — End: 2024-01-06
  Administered 2024-01-06: 50 mL
  Filled 2024-01-06: qty 50

## 2024-01-06 MED ORDER — PROPOFOL 10 MG/ML IV BOLUS
INTRAVENOUS | Status: DC | PRN
  Administered 2024-01-06: 25 mg via INTRAVENOUS
  Administered 2024-01-06: 50 mg via INTRAVENOUS
  Administered 2024-01-06: 25 mg via INTRAVENOUS
  Administered 2024-01-06: 20 mg via INTRAVENOUS

## 2024-01-06 MED ORDER — DEXTROSE 50 % IV SOLN: 50.0000 mL | Freq: Once | INTRAVENOUS | Status: DC

## 2024-01-06 MED ORDER — DEXTROSE 50 % IV SOLN
INTRAVENOUS | Status: AC
  Administered 2024-01-06: 50 mL
  Filled 2024-01-06: qty 50

## 2024-01-06 MED ORDER — BUTAMBEN-TETRACAINE-BENZOCAINE 2-2-14 % EX AERO
INHALATION_SPRAY | CUTANEOUS | Status: AC
  Filled 2024-01-06: qty 5

## 2024-01-06 MED ORDER — SODIUM CHLORIDE 0.9 % IV SOLN
INTRAVENOUS | Status: DC | PRN
Start: 2024-01-06 — End: 2024-01-06

## 2024-01-06 NOTE — Progress Notes (Signed)
 Echocardiogram Echocardiogram Transesophageal has been performed.  Ashley Camacho 01/06/2024, 2:03 PM

## 2024-01-06 NOTE — Progress Notes (Signed)
 Progress Note   Patient: Ashley Camacho FMW:[id number] DOB: 31-Aug-1990 DOA: 01/02/2024     3 DOS: the patient was seen and examined on 01/06/2024   Brief hospital course: 33yo with h/o T1DM, stage 3b CKD, 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  Cardiology consulted, treated with Lasix .  Cardiac MRI with EF 35%, global hypokinesis, concern for severe MR.  Cardiology is planning TEE and probable heart cath.  Assessment and Plan:  Acute on chronic heart failure with reduced EF Nonischemic cardiomyopathy/peripartum cardiomyopathy EF has been widely variable over the years.  Down to 25% in 2023, resolved towards 55% in June 2025, back to 35% (possibly over-called in recent years) Presented with volume overload in the setting of presumed acute on chronic heart failure exacerbation though EF was preserved with normal diastolic parameters in 2025 Heart failure team consulted, appreciate recommendations Continue with GDMT for now Treated with Lasix , changed from bolus to drip by nephrology but renal function worsening so this is now stopped Strict I's and O's ordered MRI ordered by heart failure team, shows EF 35% with global hypokinesis and concern for severe MR Not a candidate for SGLT2 due to T1DM Cardiac cath in 2023 without significant CAD Continue with Unna boots TEE 8/15 with EF 35-40%, no thrombus, no PFO/ASD, no significant valvular disease Cardiology is also holding spironolactone  and losartan  Adding hydralazine  and Imdur  for afterload reduction Continue Toprol  XL   Chest pain, resolved Elevated troponin likely secondary to demand ischemia given volume overload Monitor troponins, unlikely ACS Negative left heart cath previously Further workup per cardiology but  currently not planning for cath based on low suspicion plus CKD   Type 1 diabetes Continue basal/bolus, titrate up as tolerated No longer on insulin  pump outpatient Last A1c in 09/2023 was 8, reasonable but not optimal control Hypoglycemia this AM - likely insulin  + NPO status so will not change management as of now   Gastroparesis, severe Recently required J-tube, removed in January Continue home meds   AKI on CKD stage IIIb Baseline creatinine appears to be close to 1.9, worsening on Lasix  drip so this was stopped UPCR 2.33, some concern for nephrotic syndrome though not technically at diagnostic criteria Albumin  is 3.0 Nephrology has been consulted, currently recommending albumin  25 mg every 8 hours to optimize fluid removal Will monitor creatinine now that Lasix  drip is stopped   HTN Continue Toprol  XL Adding hydralazine /Indur Holding losartan  and spironolactone   Anemia of chronic disease Hemoglobin at baseline on presentation Recheck CBC in AM   Hypothyroidism Continue levothyroxine    Adrenal insufficiency Continue with current dose hydrocortisone .   Hyperlipidemia Continue Crestor        Consultants: Cardiology DM coordinator [facility] team   Procedures: Cardiac MRI 8/13 TEE 8/15   Antibiotics: None    30 Day Unplanned Readmission Risk Score    Flowsheet Row ED to Hosp-Admission (Current) from 01/02/2024 in [facility] PCU  30 Day Unplanned Readmission Risk Score (%) 44.41 Filed at 01/06/2024 0801    This score is the patient's risk of an unplanned readmission within 30 days of being discharged (0 -100%). The score is based on dignosis, age, lab data, medications, orders, and past utilization.   Low:  0-14.9   Medium: 15-21.9   High: 22-29.9   Extreme: 30 and above  Subjective: NPO this AM and given her insulin , had hypoglycemia which is now resolved and she is no longer symptomatic.  She is uncertain about cardiology plans,  understands plan for TEE today.  No specific concerns.  Has Unna boots, which are much more comfortable than TED hose.   Objective: Vitals:   01/06/24 1430 01/06/24 1516  BP: 121/75 130/79  Pulse: (!) 57   Resp: 11   Temp:  97.7 F (36.5 C)  SpO2: 100% 100%    Intake/Output Summary (Last 24 hours) at 01/06/2024 1631 Last data filed at 01/06/2024 1457 Gross per 24 hour  Intake 392.75 ml  Output 3450 ml  Net -3057.25 ml   Filed Weights   01/05/24 0500 01/06/24 0500 01/06/24 1319  Weight: 58.7 kg 58.6 kg 58.6 kg    Exam:  General:  Appears calm and comfortable and is in NAD Eyes:   normal lids, iris ENT:  grossly normal hearing, lips & tongue, mmm Cardiovascular:  RRR. Scant LE edema., TED hose in place Respiratory:   CTA bilaterally with no wheezes/rales/rhonchi.  Normal respiratory effort. Abdomen:  soft, NT, ND Skin:  no rash or induration seen on limited exam Musculoskeletal:  grossly normal tone BUE/BLE, good ROM, no bony abnormality Psychiatric:  grossly normal mood and affect, speech fluent and appropriate, AOx3 Neurologic:  CN 2-12 grossly intact, moves all extremities in coordinated fashion  Data Reviewed: I have reviewed the patient's lab results since admission.  Pertinent labs for today include:   Glucose 348 BUN 109/Creatinine 3.15/GFR 19 WBC 9.4 Hgb 10.6     Family Communication: Mother was present throughout evaluation  Disposition: Status is: Inpatient Remains inpatient appropriate because: ongoing management     Time spent: 50 minutes  Unresulted Labs (From admission, onward)     Start     Ordered   01/04/24 0752  Basic metabolic panel  Daily,   R     Question:  Specimen collection method  Answer:  Lab=Lab collect   01/04/24 0751   Unscheduled  CBC with Differential/Platelet  Tomorrow morning,   R       Question:  Specimen collection method  Answer:  Lab=Lab collect   01/06/24 1631             Author: Delon Herald,  MD 01/06/2024 4:31 PM  For on call review [email or web address].

## 2024-01-06 NOTE — CV Procedure (Signed)
 Procedure: TEE  Sedation: Per anesthesiology  Findings: Please see echo section for full report.  Mildly dilated LV with normal wall thickness.  LV EF 35-40% with diffuse hypokinesis. Normal RV size with mildly decreased systolic function.  Mild left atrial enlargement, no LA appendage thrombus.  Normal right atrium.  No PFO/ASD by color doppler. Trivial TR, peak RV-RA gradient 15 mmHg.  Mitral regurgitation was only trivial on this study.  Trileaflet aortic valve with no stenosis or regurgitation. Normal caliber thoracic aorta with mild plaque.   Trivial mitral regurgitation.   Ashley Camacho 01/06/2024 1:58 PM

## 2024-01-06 NOTE — Progress Notes (Signed)
 Patient ID: Ashley Camacho, female   DOB: 1991/01/21, 33 y.o.   MRN: [id number]     Advanced Heart Failure Rounding Note  Cardiologist: Redell Cave, MD  Chief Complaint:  Subjective:    She has diuresed well with Lasix  gtt, I/Os net negative 1771 for the last 24 hrs.  Now with Unna boots on.  Breathing has improved.  Unfortunately, creatinine rising (creatinine 3.15/BUN 109).  Lasix  gtt has been stopped.   Cardiac MRI: 1.  Bilateral pleural effusions, R>L. 2.  Small pericardial effusion. 3.  Mildly dilated LV with LV EF 35%, global hypokinesis. 4.  Normal RV size with RV EF 34%. 5. No myocardial LGE, so no definitive evidence for prior MI, infiltrative disease, or myocarditis. 6. Normal extracellular volume percentage but T2 mildly elevated.  Significance uncertain. 7.  Possible severe mitral regurgitation, regurgitant fraction 45%.  TEE: Mildly dilated LV with normal wall thickness. LV EF 35-40% with diffuse hypokinesis. Normal RV size with mildly decreased systolic function. Mild left atrial enlargement, no LA appendage thrombus. Normal right atrium. No PFO/ASD by color doppler. Trivial TR, peak RV-RA gradient 15 mmHg. Mitral regurgitation was only trivial on this study. Trileaflet aortic valve with no stenosis or regurgitation. IVC normal.   Objective:    Weight Range: 58.6 kg Body mass index is 24.41 kg/m.   Vital Signs:   Temp:  [97.5 F (36.4 C)-98.4 F (36.9 C)] 98.4 F (36.9 C) (08/15 1319) Pulse Rate:  [61-71] 63 (08/15 1319) Resp:  [14-20] 14 (08/15 1319) BP: (124-154)/(68-91) 137/91 (08/15 1319) SpO2:  [96 %-100 %] 99 % (08/15 1319) Weight:  [58.6 kg] 58.6 kg (08/15 1319) Last BM Date : 01/06/24 (per patient)  Weight change: Filed Weights   01/05/24 0500 01/06/24 0500 01/06/24 1319  Weight: 58.7 kg 58.6 kg 58.6 kg   Intake/Output:  Intake/Output Summary (Last 24 hours) at 01/06/2024 1400 Last data filed at 01/06/2024 0756 Gross per 24 hour  Intake  522.75 ml  Output 2450 ml  Net -1927.25 ml    Physical Exam    General: NAD Neck: No JVD, no thyromegaly or thyroid  nodule.  Lungs: Clear to auscultation bilaterally with normal respiratory effort. CV: Nondisplaced PMI.  Heart regular S1/S2, no S3/S4, no murmur.  No peripheral edema.    Abdomen: Soft, nontender, no hepatosplenomegaly, no distention.  Skin: Intact without lesions or rashes.  Neurologic: Alert and oriented x 3.  Psych: Normal affect. Extremities: No clubbing or cyanosis. Unna boots in place.  HEENT: Normal.   Telemetry   SR in 60s (personally reviewed)  Labs    CBC Recent Labs    01/06/24 0350  WBC 9.4  HGB 10.6*  HCT 32.3*  MCV 92.3  PLT 165   Basic Metabolic Panel Recent Labs    91/86/74 0853 01/05/24 0703 01/06/24 0350  NA 143 144 141  K 3.8 3.1* 3.5  CL 100 99 94*  CO2 31 30 30   GLUCOSE 146* 85 348*  BUN 59* 76* 109*  CREATININE 1.99* 2.26* 3.15*  CALCIUM  9.0 9.5 9.3  MG 1.8  --   --    Liver Function Tests Recent Labs    01/03/24 1753  AST 15  ALT 16  ALKPHOS 65  BILITOT 0.8  PROT 6.0*  ALBUMIN  3.5   BNP (last 3 results) Recent Labs    12/22/23 1534 12/26/23 1546 01/02/24 1730  BNP 4,235.7* >4,500.0* >4,500.0*   ProBNP (last 3 results) Recent Labs    12/21/23 1438  PROBNP >  70000*    Imaging   No results found.  Medications:    Scheduled Medications:  butamben -tetracaine -benzocaine        dextrose   1 ampule Intravenous Once   dextrose   50 mL Intravenous Once   [MAR Hold] divalproex   250 mg Oral q AM   [MAR Hold] enoxaparin  (LOVENOX ) injection  30 mg Subcutaneous Q24H   [MAR Hold] hydrALAZINE   10 mg Oral TID with meals   [MAR Hold] hydrocortisone   20 mg Oral Q breakfast   And   [MAR Hold] hydrocortisone   10 mg Oral Q supper   [MAR Hold] insulin  aspart  0-6 Units Subcutaneous Q4H   [MAR Hold] insulin  aspart  2 Units Subcutaneous TID WC   [MAR Hold] insulin  glargine-yfgn  8 Units Subcutaneous Daily   [START  ON 01/07/2024] isosorbide  mononitrate  15 mg Oral Daily   [MAR Hold] levothyroxine   75 mcg Oral Q0600   lidocaine        [MAR Hold] lipase/protease/amylase  24,000 Units Oral TID AC   [MAR Hold] metoprolol  succinate  25 mg Oral Daily   [MAR Hold] rosuvastatin   10 mg Oral Daily   sodium chloride  flush  3 mL Intravenous Q12H    Infusions:  [MAR Hold] albumin  human 25 g (01/06/24 0848)    PRN Medications: [MAR Hold] acetaminophen  **OR** [MAR Hold] acetaminophen , butamben -tetracaine -benzocaine , [MAR Hold] HYDROcodone -acetaminophen , lidocaine , [MAR Hold] ondansetron  **OR** [MAR Hold] ondansetron  (ZOFRAN ) IV, sodium chloride  flush  Patient Profile   Ashley Camacho Head is a 33 y.o. female with history of type 1 diabetes, CKD stage 3/diabetic nephropathy, diabetic gastroparesis, adrenal insufficiency, and CHF. Admitted with chest pressure, found to have A/C HFimpEF.   Assessment/Plan   1. Acute on chronic systolic CHF: Patient has history of nonischemic cardiomyopathy that was primarily followed at [facility] in the past.  There was concern that this could be a peri-partum cardiomyopathy.  Echo in 7/22 after the birth of her second daughter showed EF 40-45%.  This had declined to 25% by 2/23, ?peripartum CMP.  Cath in 2/23 at [facility] showed no significant CAD.  However, echo in 6/25 at [facility] was reviewed and noted to be relatively normal except for mild to moderate MR => EF 55-60%, GLS -23.4%, normal diastolic function, RV normal, mild-moderate MR, IVC normal.  Despite the unremarkable echo, she has struggled with volume overload recently. Urine protein/creatinine ratio and albumen level are not consistent with nephrotic syndrome as an explanation for her volume overload. Elevated troponin without trend likely related to demand ischemia from volume overload.  Cardiac MRI this admission showed moderately depressed LV EF 35% and RV EF 34%, no LGE/scarring noted, mitral regurgitation looked severe.  This was a  significant change from 6/25 echo.  I think coronary disease is unlikely with no LGE on cMRI and normal cath in 2023.  Would avoid coronary angiography with elevated creatinine.  ?Diabetic cardiomyopathy. TEE was done today after diuresis to assess MR, this showed EF 35-40%, mild RV dysfunction, and only trivial mitral regurgitation. She has diuresed well and is no longer volume overloaded (IVC is small on echo now).  She has developed AKI with diuresis and cardiac meds. GDMT is going to be significantly limited by AKI/CKD.  - IV Lasix  stopped today.  - Will stop spironolactone  and losartan .  - Use hydralazine  10 tid + Imdur  15 daily for afterload reduction (SBP 130s).  - Continue Toprol  XL 25 mg daily.  - She is not a candidate for SGLT2 inhibitor with type 1 diabetes.  -  Unna boots.  2. Hyperlipidemia: continue crestor  10 mg daily.  3. AKI on CKD stage 3: Diabetic nephropathy.  Creatinine baseline around 2, up to 3.15 with elevated BUN with diuresis.  - Stopped Lasix , losartan , spironolactone .   - Encourage po hydration today.  4. Type 1 diabetes: Continue insulin  per primary team.  5. HTN: BP elevated. For now, using hydralazine /Imdur  and Toprol  XL.   Ezra Shuck 01/06/2024 2:00 PM

## 2024-01-06 NOTE — Anesthesia Postprocedure Evaluation (Signed)
 Anesthesia Post Note  Patient: Ashley Camacho  Procedure(s) Performed: ECHOCARDIOGRAM, TRANSESOPHAGEAL  Patient location during evaluation: Phase II Anesthesia Type: General Level of consciousness: awake Pain management: satisfactory to patient Vital Signs Assessment: post-procedure vital signs reviewed and stable Respiratory status: spontaneous breathing and nonlabored ventilation Cardiovascular status: stable Anesthetic complications: no   No notable events documented.   Last Vitals:  Vitals:   01/06/24 1415 01/06/24 1430  BP: 114/67 121/75  Pulse: 60 (!) 57  Resp: 17 11  Temp:    SpO2: 100% 100%    Last Pain:  Vitals:   01/06/24 1319  TempSrc: Oral  PainSc:                  VAN STAVEREN,Adewale Pucillo

## 2024-01-06 NOTE — Progress Notes (Addendum)
 [facility]  ROUNDING NOTE   Subjective:   Patient seen ambulating in room Room air No lower extremity edema  Creatinine 3.15 Urine output 2650 L recorded in previous 24 hours  Objective:  Vital signs in last 24 hours:  Temp:  [97.5 F (36.4 C)-98.4 F (36.9 C)] 98.4 F (36.9 C) (08/15 1319) Pulse Rate:  [52-71] 60 (08/15 1415) Resp:  [9-27] 17 (08/15 1415) BP: (95-154)/(56-91) 114/67 (08/15 1415) SpO2:  [96 %-100 %] 100 % (08/15 1415) Weight:  [58.6 kg] 58.6 kg (08/15 1319)  Weight change: -0.1 kg Filed Weights   01/05/24 0500 01/06/24 0500 01/06/24 1319  Weight: 58.7 kg 58.6 kg 58.6 kg    Intake/Output: I/O last 3 completed shifts: In: 1284.5 [P.O.:660; I.V.:188.6; IV Piggyback:435.9] Out: 3650 [Urine:3650]   Intake/Output this shift:  Total I/O In: 50 [I.V.:50] Out: 1000 [Urine:1000]  Physical Exam: General: NAD  Head: Normocephalic, atraumatic. Moist oral mucosal membranes  Eyes: Anicteric  Neck: Supple  Lungs:  Clear to auscultation, room air  Heart: Regular rate and rhythm  Abdomen:  Soft, nontender  Extremities: Trace peripheral edema.  Neurologic: Awake, alert, conversant  Skin: Warm,dry, no rash       Basic Metabolic Panel: Recent Labs  Lab 01/03/24 0650 01/03/24 1753 01/04/24 0853 01/05/24 0703 01/06/24 0350  NA 146* 143 143 144 141  K 3.7 3.9 3.8 3.1* 3.5  CL 107 105 100 99 94*  CO2 27 27 31 30 30   GLUCOSE 81 134* 146* 85 348*  BUN 57* 54* 59* 76* 109*  CREATININE 1.84* 2.08* 1.99* 2.26* 3.15*  CALCIUM  8.3* 8.6* 9.0 9.5 9.3  MG  --   --  1.8  --   --     Liver Function Tests: Recent Labs  Lab 01/03/24 0650 01/03/24 1753  AST 12* 15  ALT 17 16  ALKPHOS 61 65  BILITOT 0.8 0.8  PROT 5.2* 6.0*  ALBUMIN  3.0* 3.5   No results for input(s): LIPASE, AMYLASE in the last 168 hours. No results for input(s): AMMONIA in the last 168 hours.  CBC: Recent Labs  Lab 01/02/24 1850 01/03/24 0650 01/06/24 0350   WBC 9.9 7.2 9.4  HGB 11.8* 10.6* 10.6*  HCT 37.0 32.1* 32.3*  MCV 95.1 94.1 92.3  PLT 174 134* 165    Cardiac Enzymes: No results for input(s): CKTOTAL, CKMB, CKMBINDEX, TROPONINI in the last 168 hours.  BNP: Invalid input(s): POCBNP  CBG: Recent Labs  Lab 01/06/24 0442 01/06/24 0754 01/06/24 1128 01/06/24 1130 01/06/24 1200  GLUCAP 339* 208* 25* 25* 188*    Microbiology: Results for orders placed or performed during the hospital encounter of 01/02/24  MRSA Next Gen by PCR, Nasal     Status: None   Collection Time: 01/05/24  5:47 PM   Specimen: Nasal Mucosa; Nasal Swab  Result Value Ref Range Status   MRSA by PCR Next Gen NOT DETECTED NOT DETECTED Final    Comment: (NOTE) The GeneXpert MRSA Assay (FDA approved for NASAL specimens only), is one component of a comprehensive MRSA colonization surveillance program. It is not intended to diagnose MRSA infection nor to guide or monitor treatment for MRSA infections. Test performance is not FDA approved in patients less than 83 years old. Performed at [facility], [address] [place], [place] [address]     Coagulation Studies: No results for input(s): LABPROT, INR in the last 72 hours.  Urinalysis: No results for input(s): COLORURINE, LABSPEC, PHURINE, GLUCOSEU, HGBUR, BILIRUBINUR, KETONESUR,  PROTEINUR, UROBILINOGEN, NITRITE, LEUKOCYTESUR in the last 72 hours.  Invalid input(s): APPERANCEUR     Imaging: No results found.    Medications:    [MAR Hold] albumin  human 25 g (01/06/24 0848)    butamben -tetracaine -benzocaine        dextrose   1 ampule Intravenous Once   dextrose   50 mL Intravenous Once   [MAR Hold] divalproex   250 mg Oral q AM   [MAR Hold] enoxaparin  (LOVENOX ) injection  30 mg Subcutaneous Q24H   [MAR Hold] hydrALAZINE   10 mg Oral TID with meals   [MAR Hold] hydrocortisone   20 mg Oral Q breakfast   And   [MAR Hold] hydrocortisone   10 mg Oral Q  supper   [MAR Hold] insulin  aspart  0-6 Units Subcutaneous Q4H   [MAR Hold] insulin  aspart  2 Units Subcutaneous TID WC   [MAR Hold] insulin  glargine-yfgn  8 Units Subcutaneous Daily   [START ON 01/07/2024] isosorbide  mononitrate  15 mg Oral Daily   [MAR Hold] levothyroxine   75 mcg Oral Q0600   lidocaine        [MAR Hold] lipase/protease/amylase  24,000 Units Oral TID AC   [MAR Hold] metoprolol  succinate  25 mg Oral Daily   [MAR Hold] rosuvastatin   10 mg Oral Daily   sodium chloride  flush  3 mL Intravenous Q12H   [MAR Hold] acetaminophen  **OR** [MAR Hold] acetaminophen , butamben -tetracaine -benzocaine , [MAR Hold] HYDROcodone -acetaminophen , lidocaine , [MAR Hold] ondansetron  **OR** [MAR Hold] ondansetron  (ZOFRAN ) IV, sodium chloride  flush  Assessment/ Plan:  Ashley Camacho is a 33 y.o.  female with past medical history of T1DM, CHF, hypertension, gastroparesis,a nd chronic kidney disease stage IIIb, who was admitted to [facility] on 01/02/2024 for CHF exacerbation (HCC) [I50.9] Volume overload [E87.70] Acute on chronic congestive heart failure, unspecified heart failure type (HCC) [I50.9]   Chronic systolic heart failure with volume overload. ECHO from 11/02/23 shows EF 55-60 %, more recent ECHO has EF 35%.  Chest x-ray negative for acute findings.. Remains on IV furosemide  at 15mg /hr. Diuresing well. Scheduled for TEE today. Will plan for angiogram once renal function stable  2. Chronic kidney disease stage IIIb.  Baseline creatinine appears to be 1.7-1.8.  Creatinine continues to rise with aggressive diuresis. Remains on Spironolactone  and IV furosemide .  Lab Results  Component Value Date   CREATININE 3.15 (H) 01/06/2024   CREATININE 2.26 (H) 01/05/2024   CREATININE 1.99 (H) 01/04/2024    Intake/Output Summary (Last 24 hours) at 01/06/2024 1416 Last data filed at 01/06/2024 1354 Gross per 24 hour  Intake 392.75 ml  Output 2150 ml  Net -1757.25 ml     3. Diabetes mellitus type I with  chronic kidney disease/renal manifestations: insulin  dependent. Home regimen includes Novolog  and Lantus . Most recent hemoglobin A1c is 8.0 on 10/09/23.   Primary team will manage glucose control  4. Anemia of chronic kidney disease Lab Results  Component Value Date   HGB 10.6 (L) 01/06/2024    Hgb at goal  5. Hypertension with chronic kidney disease. Home regimen includes furosemide , metoprolol  and spironolactone . Blood pressure stable   LOS: 3 Ashley Camacho 8/15/20252:16 PM

## 2024-01-06 NOTE — Plan of Care (Signed)
 Patients Lunch CBG was 25 (late to move over to epic).  Fingers, toes and lips tingled, slightly lightheaded, but A/O x4.  Dr. Informed and pushed dextrose .  Re-checked CBG was 188.  Patient states,  I feel much better now.

## 2024-01-06 NOTE — Progress Notes (Signed)
 Patient has remained clinically stable post TEE per Dr Vergil, via anesthesia, tolerated well. Awake/alert and oriented post procedure. Report given to Donnice Peak post procedure/258/2A. Denies complaints at this time. Rechecked blood sugar of 185. Npo until 1500.

## 2024-01-06 NOTE — Transfer of Care (Signed)
 Immediate Anesthesia Transfer of Care Note  Patient: Ashley Camacho  Procedure(s) Performed: ECHOCARDIOGRAM, TRANSESOPHAGEAL  Patient Location: PACU and Short Stay  Anesthesia Type:MAC  Level of Consciousness: awake  Airway & Oxygen Therapy: Patient Spontanous Breathing and Patient connected to nasal cannula oxygen  Post-op Assessment: Report given to RN and Post -op Vital signs reviewed and stable  Post vital signs: Reviewed and stable  Last Vitals:  Vitals Value Taken Time  BP    Temp    Pulse    Resp    SpO2      Last Pain:  Vitals:   01/06/24 1319  TempSrc: Oral  PainSc:          Complications: No notable events documented.

## 2024-01-06 NOTE — Consult Note (Addendum)
 WOC Nurse Consult Note: Reason for Consult: Requested to apply Unna boots. Wound type: No open wound.  Pressure Injury POA: NA Bilateral unna boots, skin intact on both legs, no edema. Pt were using compressive socks. Dressing procedure/placement/frequency: Cleanse with warm water , apply a moisturizing on both legs.  Wrap the legs with unna boot, second layer with Coban. Since the base of toes, until the distal knee. Change in 7 days.  WOC team will follow THURS or FRI if she still inpatient. Please reconsult if further assistance is needed. Thank-you,  Lela Holm RN, CNS, [employer], MSN.  (Phone [phone])

## 2024-01-06 NOTE — Inpatient Diabetes Management (Signed)
 Inpatient Diabetes Program Recommendations  AACE/ADA: New Consensus Statement on Inpatient Glycemic Control (2015)  Target Ranges:  Prepandial:   less than 140 mg/dL      Peak postprandial:   less than 180 mg/dL (1-2 hours)      Critically ill patients:  140 - 180 mg/dL    Latest Reference Range & Units 01/05/24 08:32 01/05/24 11:51 01/05/24 16:45 01/05/24 20:24 01/05/24 21:23 01/05/24 23:50  Glucose-Capillary 70 - 99 mg/dL 884 (H) 801 (H) 714 (H) 424 (H) 394 (H) 146 (H)  (H): Data is abnormally high  Latest Reference Range & Units 01/06/24 07:54 01/06/24 11:28 01/06/24 11:30  Glucose-Capillary 70 - 99 mg/dL 791 (H)  2 units Novolog   8 units Semglee  @0924  25 (LL) 25 (LL)  (LL): Data is critically low (H): Data is abnormally high     History: Type 1 Diabetes  Home DM Meds: Lantus  10 units daily Novolog  0-11 units TID Novolog  2 units TID with meals Cortef  20 mg QAM, Cortef  10 mg QPM Dexcom G7 CGM   Current Orders: Semglee  8 units daily Novolog  0-6 units Q4H Novolog  2 units TID with meals PO Cortef  20 mg AM/ 10 mg PM    MD- Note Severe Hypoglycemia at 11:30am today Pt NPO for TEE Got 2 units Novolog  SSI for CBG of 208 this AM Very Sensitive to insulin  especially when NPO Would continue current regimen as is--AM CBGs have been OK on current Semglee  dose    --Will follow patient during hospitalization--  Adina Rudolpho Arrow RN, MSN, CDCES Diabetes Coordinator Inpatient Glycemic Control Team Team Pager: [phone] (8a-5p)

## 2024-01-06 NOTE — Plan of Care (Signed)

## 2024-01-06 NOTE — Interval H&P Note (Signed)
 History and Physical Interval Note:  01/06/2024 1:36 PM  Ashley Camacho  has presented today for surgery, with the diagnosis of mitral regurgitation.  The various methods of treatment have been discussed with the patient and family. After consideration of risks, benefits and other options for treatment, the patient has consented to  Procedure(s): ECHOCARDIOGRAM, TRANSESOPHAGEAL (N/A) as a surgical intervention.  The patient's history has been reviewed, patient examined, no change in status, stable for surgery.  I have reviewed the patient's chart and labs.  Questions were answered to the patient's satisfaction.     Xaniyah Buchholz [employer]

## 2024-01-06 NOTE — Anesthesia Preprocedure Evaluation (Addendum)
 Anesthesia Evaluation  Patient identified by MRN, date of birth, ID band Patient awake    Reviewed: Allergy & Precautions, NPO status , Patient's Chart, lab work & pertinent test results  Airway Mallampati: III  TM Distance: <3 FB Neck ROM: full  Mouth opening: Limited Mouth Opening  Dental  (+) Missing   Pulmonary neg pulmonary ROS, shortness of breath and with exertion   Pulmonary exam normal breath sounds clear to auscultation       Cardiovascular hypertension, Pt. on medications +CHF  Normal cardiovascular exam Rhythm:Regular Rate:Normal     Neuro/Psych  Headaches  Anxiety     Hx of acute metabolic encepalopathy. negative neurological ROS  negative psych ROS   GI/Hepatic negative GI ROS, Neg liver ROS,,,  Endo/Other  diabetes, Poorly Controlled, Type 1, Insulin  DependentHypothyroidism  Hx of Adrenal Insufficiency  Renal/GU negative Renal ROS  negative genitourinary   Musculoskeletal   Abdominal   Peds negative pediatric ROS (+)  Hematology negative hematology ROS (+) Blood dyscrasia, anemia   Anesthesia Other Findings Past Medical History: 06/10/2022: Acute metabolic encephalopathy No date: Adrenal insufficiency (HCC) No date: Anemia No date: CHF (congestive heart failure) (HCC) No date: Gastroparesis No date: Hypertension No date: Hypotension 11/13/2022: Intractable nausea and vomiting No date: Migraine No date: Thyroid  disease No date: Type 1 diabetes [facility])  Past Surgical History: 01/14/2021: BIOPSY     Comment:  Procedure: BIOPSY;  Surgeon: Eda Iha, MD;                Location: [facility] ENDOSCOPY;  Service: Gastroenterology;; 10/10/2023: CENTRAL LINE INSERTION; N/A     Comment:  Procedure: CENTRAL LINE INSERTION;  Surgeon: Marea Selinda RAMAN, MD;  Location: [facility] INVASIVE CV LAB;  Service:               Cardiovascular;  Laterality: N/A; No date: CESAREAN SECTION     Comment:   x2 02/17/2021: CESAREAN SECTION WITH BILATERAL TUBAL LIGATION; N/A     Comment:  Procedure: CESAREAN SECTION WITH BILATERAL TUBAL               LIGATION;  Surgeon: Barbra Lang PARAS, DO;  Location: [facility]               LD ORS;  Service: Obstetrics;  Laterality: N/A; 01/14/2021: ESOPHAGOGASTRODUODENOSCOPY (EGD) WITH PROPOFOL ; N/A     Comment:  Procedure: ESOPHAGOGASTRODUODENOSCOPY (EGD) WITH               PROPOFOL ;  Surgeon: Eda Iha, MD;  Location: [facility]               ENDOSCOPY;  Service: Gastroenterology;  Laterality: N/A; 12/16/2022: IR BONE MARROW BIOPSY & ASPIRATION 11/22/2022: IR REPLC DUODEN/JEJUNO TUBE PERCUT W/FLUORO 06/13/2022: JEJUNOSTOMY; N/A     Comment:  Procedure: JEJUNOSTOMY;  Surgeon: Jordis Laneta FALCON, MD;                Location: [facility] ORS;  Service: General;  Laterality: N/A; No date: MOUTH SURGERY  BMI    Body Mass Index: 24.41 kg/m      Reproductive/Obstetrics negative OB ROS                              Anesthesia Physical Anesthesia Plan  ASA: 3  Anesthesia Plan: General   Post-op Pain Management:    Induction: Intravenous  PONV Risk Score and  Plan: Propofol  infusion and TIVA  Airway Management Planned: Natural Airway and Nasal Cannula  Additional Equipment:   Intra-op Plan:   Post-operative Plan:   Informed Consent: I have reviewed the patients History and Physical, chart, labs and discussed the procedure including the risks, benefits and alternatives for the proposed anesthesia with the patient or authorized representative who has indicated his/her understanding and acceptance.     Dental Advisory Given  Plan Discussed with: CRNA  Anesthesia Plan Comments:          Anesthesia Quick Evaluation

## 2024-01-07 DIAGNOSIS — I5023 Acute on chronic systolic (congestive) heart failure: Secondary | ICD-10-CM | POA: Diagnosis not present

## 2024-01-07 LAB — BASIC METABOLIC PANEL WITH GFR
Anion gap: 23 — ABNORMAL HIGH (ref 5–15)
BUN: 119 mg/dL — ABNORMAL HIGH (ref 6–20)
CO2: 25 mmol/L (ref 22–32)
Calcium: 9 mg/dL (ref 8.9–10.3)
Chloride: 93 mmol/L — ABNORMAL LOW (ref 98–111)
Creatinine, Ser: 3.07 mg/dL — ABNORMAL HIGH (ref 0.44–1.00)
GFR, Estimated: 20 mL/min — ABNORMAL LOW (ref >60)
Glucose, Bld: 158 mg/dL — ABNORMAL HIGH (ref 70–99)
Potassium: 3.5 mmol/L (ref 3.5–5.1)
Sodium: 141 mmol/L (ref 135–145)

## 2024-01-07 LAB — CBC WITH DIFFERENTIAL/PLATELET
Abs Immature Granulocytes: 0.01 K/uL (ref 0.00–0.07)
Basophils Absolute: 0.1 K/uL (ref 0.0–0.1)
Basophils Relative: 1 %
Eosinophils Absolute: 0.2 K/uL (ref 0.0–0.5)
Eosinophils Relative: 3 %
HCT: 34 % — ABNORMAL LOW (ref 36.0–46.0)
Hemoglobin: 11.2 g/dL — ABNORMAL LOW (ref 12.0–15.0)
Immature Granulocytes: 0 %
Lymphocytes Relative: 21 %
Lymphs Abs: 1.5 K/uL (ref 0.7–4.0)
MCH: 30.9 pg (ref 26.0–34.0)
MCHC: 32.9 g/dL (ref 30.0–36.0)
MCV: 93.7 fL (ref 80.0–100.0)
Monocytes Absolute: 0.6 K/uL (ref 0.1–1.0)
Monocytes Relative: 8 %
Neutro Abs: 4.7 K/uL (ref 1.7–7.7)
Neutrophils Relative %: 67 %
Platelets: 164 K/uL (ref 150–400)
RBC: 3.63 MIL/uL — ABNORMAL LOW (ref 3.87–5.11)
RDW: 15.7 % — ABNORMAL HIGH (ref 11.5–15.5)
WBC: 7 K/uL (ref 4.0–10.5)
nRBC: 0 % (ref 0.0–0.2)

## 2024-01-07 LAB — GLUCOSE, CAPILLARY
Glucose-Capillary: 127 mg/dL — ABNORMAL HIGH (ref 70–99)
Glucose-Capillary: 179 mg/dL — ABNORMAL HIGH (ref 70–99)
Glucose-Capillary: 189 mg/dL — ABNORMAL HIGH (ref 70–99)
Glucose-Capillary: 92 mg/dL (ref 70–99)

## 2024-01-07 MED ORDER — ROSUVASTATIN CALCIUM 10 MG PO TABS: 10.0000 mg | ORAL_TABLET | Freq: Every day | ORAL | 0 refills | Status: DC

## 2024-01-07 MED ORDER — ISOSORBIDE MONONITRATE ER 30 MG PO TB24: 15.0000 mg | ORAL_TABLET | Freq: Every day | ORAL | 0 refills | Status: DC

## 2024-01-07 MED ORDER — METOPROLOL SUCCINATE ER 25 MG PO TB24: 25.0000 mg | ORAL_TABLET | Freq: Every day | ORAL | 0 refills | Status: DC

## 2024-01-07 MED ORDER — DIVALPROEX SODIUM 250 MG PO DR TAB
250.0000 mg | DELAYED_RELEASE_TABLET | Freq: Every day | ORAL | Status: DC
  Administered 2024-01-07: 250 mg via ORAL
  Filled 2024-01-07: qty 1

## 2024-01-07 MED ORDER — HYDRALAZINE HCL 10 MG PO TABS: 10.0000 mg | ORAL_TABLET | Freq: Three times a day (TID) | ORAL | 0 refills | Status: DC

## 2024-01-07 NOTE — Plan of Care (Signed)

## 2024-01-07 NOTE — Progress Notes (Signed)
 [facility]  PROGRESS NOTE   Subjective:   Seen at bed side. Comfortable. Edema improved. Off of diuretics.  Objective:  Vital signs: Blood pressure 137/82, pulse 67, temperature 97.7 F (36.5 C), temperature source Oral, resp. rate 18, height 5' 1 (1.549 m), weight 56.7 kg, last menstrual period 11/28/2023, SpO2 100%.  Intake/Output Summary (Last 24 hours) at 01/07/2024 1112 Last data filed at 01/07/2024 0900 Gross per 24 hour  Intake 862.75 ml  Output 1000 ml  Net -137.25 ml   Filed Weights   01/06/24 0500 01/06/24 1319 01/07/24 0500  Weight: 58.6 kg 58.6 kg 56.7 kg     Physical Exam: General:  No acute distress  Head:  Normocephalic, atraumatic. Moist oral mucosal membranes  Eyes:  Anicteric  Neck:  Supple  Lungs:   Clear to auscultation, normal effort  Heart:  S1S2 no rubs  Abdomen:   Soft, nontender, bowel sounds present  Extremities:  peripheral edema.  Neurologic:  Awake, alert, following commands  Skin:  No lesions  Access:     Basic Metabolic Panel: Recent Labs  Lab 01/03/24 1753 01/04/24 0853 01/05/24 0703 01/06/24 0350 01/07/24 0547  NA 143 143 144 141 141  K 3.9 3.8 3.1* 3.5 3.5  CL 105 100 99 94* 93*  CO2 27 31 30 30 25   GLUCOSE 134* 146* 85 348* 158*  BUN 54* 59* 76* 109* 119*  CREATININE 2.08* 1.99* 2.26* 3.15* 3.07*  CALCIUM  8.6* 9.0 9.5 9.3 9.0  MG  --  1.8  --   --   --    GFR: Estimated Creatinine Clearance: 19.9 mL/min (A) (by C-G formula based on SCr of 3.07 mg/dL (H)).  Liver Function Tests: Recent Labs  Lab 01/03/24 0650 01/03/24 1753  AST 12* 15  ALT 17 16  ALKPHOS 61 65  BILITOT 0.8 0.8  PROT 5.2* 6.0*  ALBUMIN  3.0* 3.5   No results for input(s): LIPASE, AMYLASE in the last 168 hours. No results for input(s): AMMONIA in the last 168 hours.  CBC: Recent Labs  Lab 01/02/24 1850 01/03/24 0650 01/06/24 0350 01/07/24 0547  WBC 9.9 7.2 9.4 7.0  NEUTROABS  --   --   --  4.7  HGB 11.8* 10.6* 10.6*  11.2*  HCT 37.0 32.1* 32.3* 34.0*  MCV 95.1 94.1 92.3 93.7  PLT 174 134* 165 164     HbA1C: Hgb A1c MFr Bld  Date/Time Value Ref Range Status  10/09/2023 08:34 PM 8.0 (H) 4.8 - 5.6 % Final    Comment:    (NOTE) Pre diabetes:          5.7%-6.4%  Diabetes:              >6.4%  Glycemic control for   <7.0% adults with diabetes   05/10/2023 02:05 PM 10.3 (H) 4.8 - 5.6 % Final    Comment:             Prediabetes: 5.7 - 6.4          Diabetes: >6.4          Glycemic control for adults with diabetes: <7.0     Urinalysis: No results for input(s): COLORURINE, LABSPEC, PHURINE, GLUCOSEU, HGBUR, BILIRUBINUR, KETONESUR, PROTEINUR, UROBILINOGEN, NITRITE, LEUKOCYTESUR in the last 72 hours.  Invalid input(s): APPERANCEUR    Imaging: ECHO TEE Result Date: 01/06/2024    TRANSESOPHOGEAL ECHO REPORT   Patient Name:   ANYRA KAUFMAN Date of Exam: 01/06/2024 Medical Rec #:  [id number]  Height:       61.0 in Accession #:    [id number]      Weight:       129.2 lb Date of Birth:  12/16/1990      BSA:          1.569 m Patient Age:    32 years        BP:           134/74 mmHg Patient Gender: F               HR:           57 bpm. Exam Location:  [facility] Procedure: Transesophageal Echo, Cardiac Doppler and Color Doppler (Both            Spectral and Color Flow Doppler were utilized during procedure). Indications:     Mitral Regurgitation  History:         Patient has prior history of Echocardiogram examinations, most                  recent 11/03/2023. CHF, CKD, stage 3,                  Signs/Symptoms:Hypotension, Chest Pain and Shortness of Breath;                  Risk Factors:Hypertension and Diabetes.  Sonographer:     Thea Norlander RCS Referring Phys:  [id number] S[place] LEE Diagnosing Phys: Ezra Kanner PROCEDURE: After discussion of the risks and benefits of a TEE, an informed consent was obtained from the patient. The transesophogeal probe was passed without difficulty  through the esophogus of the patient. Sedation performed by different physician. The patient was monitored while under deep sedation. Anesthestetic sedation was provided intravenously by Anesthesiology: 120mg  of Propofol , 0mg  of Lidocaine . The patient developed no complications during the procedure.  IMPRESSIONS  1. Left ventricular ejection fraction, by estimation, is 35 to 40%. The left ventricle has moderately decreased function. The left ventricle demonstrates global hypokinesis. The left ventricular internal cavity size was mildly dilated.  2. Right ventricular systolic function is low normal. The right ventricular size is normal. The estimated right ventricular systolic pressure is 18.1 mmHg.  3. Left atrial size was mildly dilated. No left atrial/left atrial appendage thrombus was detected.  4. No PFO/ASD by color doppler.  5. The mitral valve is normal in structure. Trivial mitral valve regurgitation. No evidence of mitral stenosis.  6. The aortic valve is normal in structure. Aortic valve regurgitation is not visualized. No aortic stenosis is present.  7. The inferior vena cava is normal in size with greater than 50% respiratory variability, suggesting right atrial pressure of 3 mmHg. FINDINGS  Left Ventricle: Left ventricular ejection fraction, by estimation, is 35 to 40%. The left ventricle has moderately decreased function. The left ventricle demonstrates global hypokinesis. The left ventricular internal cavity size was mildly dilated. There is no left ventricular hypertrophy. Right Ventricle: The right ventricular size is normal. No increase in right ventricular wall thickness. Right ventricular systolic function is low normal. The tricuspid regurgitant velocity is 1.94 m/s, and with an assumed right atrial pressure of 3 mmHg, the estimated right ventricular systolic pressure is 18.1 mmHg. Left Atrium: Left atrial size was mildly dilated. No left atrial/left atrial appendage thrombus was detected. Right  Atrium: Right atrial size was normal in size. Pericardium: There is no evidence of pericardial effusion. Mitral Valve: The mitral valve is normal in structure. Trivial mitral valve  regurgitation. No evidence of mitral valve stenosis. Tricuspid Valve: The tricuspid valve is normal in structure. Tricuspid valve regurgitation is trivial. Aortic Valve: The aortic valve is normal in structure. Aortic valve regurgitation is not visualized. No aortic stenosis is present. Pulmonic Valve: The pulmonic valve was normal in structure. Pulmonic valve regurgitation is not visualized. Aorta: The aortic root and ascending aorta are structurally normal, with no evidence of dilitation. Venous: The inferior vena cava is normal in size with greater than 50% respiratory variability, suggesting right atrial pressure of 3 mmHg. IAS/Shunts: No PFO/ASD by color doppler. Additional Comments: Spectral Doppler performed. IVC IVC diam: 1.30 cm TRICUSPID VALVE TR Peak grad:   15.1 mmHg TR Vmax:        194.00 cm/s Dalton McleanMD Electronically signed by Ezra Kanner Signature Date/Time: 01/06/2024/3:21:22 PM    Final      Medications:     dextrose   1 ampule Intravenous Once   dextrose   50 mL Intravenous Once   divalproex   250 mg Oral Q breakfast   enoxaparin  (LOVENOX ) injection  30 mg Subcutaneous Q24H   hydrALAZINE   10 mg Oral TID with meals   hydrocortisone   20 mg Oral Q breakfast   And   hydrocortisone   10 mg Oral Q supper   insulin  aspart  0-6 Units Subcutaneous Q4H   insulin  aspart  2 Units Subcutaneous TID WC   insulin  glargine-yfgn  8 Units Subcutaneous Daily   isosorbide  mononitrate  15 mg Oral Daily   levothyroxine   75 mcg Oral Q0600   lipase/protease/amylase  24,000 Units Oral TID AC   metoprolol  succinate  25 mg Oral Daily   rosuvastatin   10 mg Oral Daily    Assessment/ Plan:     Ms. KEYLY BALDONADO is a 33 y.o.  female with past medical history of T1DM, CHF, hypertension, gastroparesis,a nd chronic kidney  disease stage IIIb, who was admitted to [facility] on 01/02/2024 for CHF exacerbation (HCC) [I50.9] Volume overload [E87.70] Acute on chronic congestive heart failure, unspecified heart failure type (HCC) [I50.9]     #1: Chronic systolic heart failure with volume overload. ECHO from 11/02/23 shows EF 55-60 %, more recent ECHO has EF 35%.  Chest x-ray negative for acute findings..Cardiology note appreciated.  Resume diuresis with torsemide at 20 mg daily.  #2 .  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  Patient is scheduled for follow-up in our office.  #3: Diabetes: Continue insulin  as ordered.  #4: Hypertension: Continue metoprolol  and hydralazine .  Advised on the importance of 2 g salt restricted diet.  #5: Anemia: Anemia probably secondary to chronic kidney disease versus iron  deficiency.  Labs and medications reviewed. Stable for discharge from renal standpoint. Patient is agreeable to follow-up in the office.   LOS: 4 Renso Swett, MD [facility] 8/16/202511:12 AM

## 2024-01-07 NOTE — Discharge Summary (Signed)
 Physician Discharge Summary   Patient: Ashley Camacho MRN: [id number] DOB: 10/19/1990  Admit date:     01/02/2024  Discharge date: 01/07/24  Discharge Physician: Delon Herald   PCP: Dineen Channel, PA-C   Recommendations at discharge:   For now, do not take losartan , furosemide , or spironolactone  New medications include hydralazine  and Imdur  for heart failure as well as Crestor  for cholesterol Follow [facility] with [facility] Follow up with nephrology as scheduled Follow up with endocrinology; referral placed Follow up with PA [facility] in 1-2 weeks Follow up with wound care for once weekly Unna boot changes You are also being referred to medical nutrition  Discharge Diagnoses: Principal Problem:   Acute on chronic HFrEF (heart failure with reduced ejection fraction) (HCC) Active Problems:   Chest pain   Type 1 diabetes mellitus (HCC)   Gastroparesis   Anemia of chronic disease   CKD stage 3b, GFR 30-44 ml/min (HCC)   Hypothyroidism   Adrenal insufficiency (Addison's disease) (HCC)   Volume overload   Nonrheumatic mitral valve regurgitation    Hospital Course: 32yo with h/o T1DM, stage 3b CKD, 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  Cardiology consulted, treated with Lasix .  Cardiac MRI with EF 35%, global hypokinesis, concern for severe MR.  Cardiology is planning TEE and probable heart cath.  Assessment and Plan:  Acute on chronic heart failure with reduced EF Nonischemic cardiomyopathy/peripartum cardiomyopathy EF has been widely variable over the years.  Down to 25% in 2023, resolved towards 55% in June 2025, back to 35% (possibly over-called in recent years) Presented with volume overload in the setting of presumed acute on chronic heart  failure exacerbation though EF was preserved with normal diastolic parameters in 2025 Heart failure team consulted, appreciate recommendations Continue with GDMT for now Treated with Lasix , changed from bolus to drip by nephrology but renal function worsening so this is now stopped Strict I's and O's ordered MRI ordered by heart failure team, shows EF 35% with global hypokinesis and concern for severe MR Not a candidate for SGLT2 due to T1DM Cardiac cath in 2023 without significant CAD Continue with Unna boots TEE 8/15 with EF 35-40%, no thrombus, no PFO/ASD, no significant valvular disease Cardiology is also holding spironolactone  and losartan  Adding hydralazine  and Imdur  for afterload reduction Continue Toprol  XL   Chest pain, resolved Elevated troponin likely secondary to demand ischemia given volume overload Monitor troponins, unlikely ACS Negative left heart cath previously Further workup per cardiology but currently not planning for cath based on low suspicion plus CKD   Type 1 diabetes Continue basal/bolus, titrate up as tolerated No longer on insulin  pump outpatient Last A1c in 09/2023 was 8, reasonable but not optimal control Hypoglycemia this AM - likely insulin  + NPO status so will not change management as of now   Gastroparesis, severe Recently required J-tube, removed in January Continue home meds   AKI on CKD stage IIIb Baseline creatinine appears to be close to 1.9, worsening on Lasix  drip so this was stopped UPCR 2.33, some concern for nephrotic syndrome though not technically at diagnostic criteria Albumin  is 3.0 Nephrology has been consulted, currently recommending albumin  25 mg every 8 hours to optimize fluid removal Will monitor creatinine now that Lasix  drip is stopped   HTN Continue Toprol  XL Adding hydralazine /Indur Holding losartan  and  spironolactone    Anemia of chronic disease Hemoglobin at baseline on presentation Recheck CBC in AM    Hypothyroidism Continue levothyroxine    Adrenal insufficiency Continue with current dose hydrocortisone .   Hyperlipidemia Continue Crestor        Consultants: Cardiology DM coordinator [facility] team   Procedures: Cardiac MRI 8/13 TEE 8/15   Antibiotics: None    Pain control - [place]  Controlled Substance Reporting System database was reviewed. and patient was instructed, not to drive, operate heavy machinery, perform activities at heights, swimming or participation in water  activities or provide baby-sitting services while on Pain, Sleep and Anxiety Medications; until their outpatient Physician has advised to do so again. Also recommended to not to take more than prescribed Pain, Sleep and Anxiety Medications.   Disposition: Home Diet recommendation:  Cardiac and Carb modified diet DISCHARGE MEDICATION: Allergies as of 01/07/2024   No Known Allergies      Medication List     PAUSE taking these medications    furosemide  20 MG tablet Wait to take this until your doctor or other care provider tells you to start again. Commonly known as: LASIX  Take 2 tablets (40 mg total) by mouth daily.   spironolactone  25 MG tablet Wait to take this until your doctor or other care provider tells you to start again. Commonly known as: ALDACTONE  Take 0.5 tablets (12.5 mg total) by mouth daily.       STOP taking these medications    metoprolol  tartrate 25 MG tablet Commonly known as: LOPRESSOR        TAKE these medications    acetaminophen  500 MG tablet Commonly known as: TYLENOL  Take 2 tablets (1,000 mg total) by mouth every 6 (six) hours as needed for headache, fever or moderate pain.   Baqsimi One Pack 3 MG/DOSE Powd Generic drug: Glucagon Place 3 mg into the nose as needed.   Creon  24000-76000 units Cpep Generic drug: Pancrelipase  (Lip-Prot-Amyl) Take 1 capsule (24,000 Units total) by mouth 3 (three) times daily before meals.   Dexcom G7 Sensor  Misc SMARTSIG:1 Each Every 10 Days   divalproex  250 MG DR tablet Commonly known as: DEPAKOTE  Take 250 mg by mouth in the morning.   hydrALAZINE  10 MG tablet Commonly known as: APRESOLINE  Take 1 tablet (10 mg total) by mouth with breakfast, with lunch, and with evening meal.   hydrocortisone  10 MG tablet Commonly known as: CORTEF  Take 2 tablets (20 mg total) by mouth daily with breakfast AND 1 tablet (10 mg total) daily with supper.   insulin  aspart 100 UNIT/ML FlexPen Commonly known as: NOVOLOG  Inject 2 Units into the skin 3 (three) times daily with meals. If eating and Blood Glucose (BG) 80 or higher inject 2 units for meal coverage and add correction dose per scale. If not eating, correction dose only. BG <150= 0 unit; BG 150-200= 1 unit; BG 201-250= 3 unit; BG 251-300= 5 unit; BG 301-350= 7 unit; BG 351-400= 9 unit; BG >400= 11 unit and Call Primary Care.   insulin  glargine 100 UNIT/ML Solostar Pen Commonly known as: LANTUS  Inject 10 Units into the skin daily.   isosorbide  mononitrate 30 MG 24 hr tablet Commonly known as: IMDUR  Take 0.5 tablets (15 mg total) by mouth daily. Start taking on: January 08, 2024   levothyroxine  75 MCG tablet Commonly known as: SYNTHROID  TAKE 1 TABLET (75 MCG TOTAL) BY MOUTH DAILY AT 6 (SIX) AM.   metoprolol  succinate 25 MG 24 hr tablet Commonly known as: TOPROL -XL Take 1 tablet (25  mg total) by mouth daily. Start taking on: January 08, 2024   Pen Needles 31G X 6 MM Misc PEN NEEDLES 31G X 6 MM   BD Pen Needle Mini Ultrafine 31G X 5 MM Misc Generic drug: Insulin  Pen Needle by Other route.   rosuvastatin  10 MG tablet Commonly known as: CRESTOR  Take 1 tablet (10 mg total) by mouth daily. Start taking on: January 08, 2024               Discharge Care Instructions  (From admission, onward)           Start     Ordered   01/07/24 0000  Discharge wound care:       Comments: Unna boots to be changed weekly at [facility]  (referral placed)   01/07/24 1240            Discharge Exam:   Subjective: Feeling better, fluid is better, breathing ok, eager to go home.   Objective: Vitals:   01/07/24 0851 01/07/24 1217  BP: 137/82 126/77  Pulse: 67 66  Resp:  16  Temp:  (!) 97.5 F (36.4 C)  SpO2:  100%    Intake/Output Summary (Last 24 hours) at 01/07/2024 1240 Last data filed at 01/07/2024 0900 Gross per 24 hour  Intake 862.75 ml  Output 1000 ml  Net -137.25 ml   Filed Weights   01/06/24 0500 01/06/24 1319 01/07/24 0500  Weight: 58.6 kg 58.6 kg 56.7 kg    Exam:  General:  Appears calm and comfortable and is in NAD, on RA Eyes:   normal lids, iris ENT:  grossly normal hearing, lips & tongue, mmm Cardiovascular:  RRR. Unna boots in place Respiratory:   CTA bilaterally with no wheezes/rales/rhonchi.  Normal respiratory effort. Abdomen:  soft, NT, ND Skin:  no rash or induration seen on limited exam Musculoskeletal:  grossly normal tone BUE/BLE, good ROM, no bony abnormality Psychiatric:  grossly normal mood and affect, speech fluent and appropriate, AOx3 Neurologic:  CN 2-12 grossly intact, moves all extremities in coordinated fashion  Data Reviewed: I have reviewed the patient's lab results since admission.  Pertinent labs for today include:  Glucose 158 BUN 119/Creatinine 3.07/GFR 20, improved off Lasix  drip WBC 7 Hgb 11.2, stable     Condition at discharge: improving  The results of significant diagnostics from this hospitalization (including imaging, microbiology, ancillary and laboratory) are listed below for reference.   Imaging Studies: ECHO TEE Result Date: 01/06/2024    TRANSESOPHOGEAL ECHO REPORT   Patient Name:   Ashley Camacho Date of Exam: 01/06/2024 Medical Rec #:  [id number]       Height:       61.0 in Accession #:    [id number]      Weight:       129.2 lb Date of Birth:  1990-09-27      BSA:          1.569 m Patient Age:    32 years        BP:           134/74 mmHg  Patient Gender: F               HR:           57 bpm. Exam Location:  [facility] Procedure: Transesophageal Echo, Cardiac Doppler and Color Doppler (Both            Spectral and Color Flow Doppler were utilized during procedure). Indications:  Mitral Regurgitation  History:         Patient has prior history of Echocardiogram examinations, most                  recent 11/03/2023. CHF, CKD, stage 3,                  Signs/Symptoms:Hypotension, Chest Pain and Shortness of Breath;                  Risk Factors:Hypertension and Diabetes.  Sonographer:     Thea Norlander RCS Referring Phys:  [id number] S[place] LEE Diagnosing Phys: Ezra Kanner PROCEDURE: After discussion of the risks and benefits of a TEE, an informed consent was obtained from the patient. The transesophogeal probe was passed without difficulty through the esophogus of the patient. Sedation performed by different physician. The patient was monitored while under deep sedation. Anesthestetic sedation was provided intravenously by Anesthesiology: 120mg  of Propofol , 0mg  of Lidocaine . The patient developed no complications during the procedure.  IMPRESSIONS  1. Left ventricular ejection fraction, by estimation, is 35 to 40%. The left ventricle has moderately decreased function. The left ventricle demonstrates global hypokinesis. The left ventricular internal cavity size was mildly dilated.  2. Right ventricular systolic function is low normal. The right ventricular size is normal. The estimated right ventricular systolic pressure is 18.1 mmHg.  3. Left atrial size was mildly dilated. No left atrial/left atrial appendage thrombus was detected.  4. No PFO/ASD by color doppler.  5. The mitral valve is normal in structure. Trivial mitral valve regurgitation. No evidence of mitral stenosis.  6. The aortic valve is normal in structure. Aortic valve regurgitation is not visualized. No aortic stenosis is present.  7. The inferior vena cava is normal in size with greater  than 50% respiratory variability, suggesting right atrial pressure of 3 mmHg. FINDINGS  Left Ventricle: Left ventricular ejection fraction, by estimation, is 35 to 40%. The left ventricle has moderately decreased function. The left ventricle demonstrates global hypokinesis. The left ventricular internal cavity size was mildly dilated. There is no left ventricular hypertrophy. Right Ventricle: The right ventricular size is normal. No increase in right ventricular wall thickness. Right ventricular systolic function is low normal. The tricuspid regurgitant velocity is 1.94 m/s, and with an assumed right atrial pressure of 3 mmHg, the estimated right ventricular systolic pressure is 18.1 mmHg. Left Atrium: Left atrial size was mildly dilated. No left atrial/left atrial appendage thrombus was detected. Right Atrium: Right atrial size was normal in size. Pericardium: There is no evidence of pericardial effusion. Mitral Valve: The mitral valve is normal in structure. Trivial mitral valve regurgitation. No evidence of mitral valve stenosis. Tricuspid Valve: The tricuspid valve is normal in structure. Tricuspid valve regurgitation is trivial. Aortic Valve: The aortic valve is normal in structure. Aortic valve regurgitation is not visualized. No aortic stenosis is present. Pulmonic Valve: The pulmonic valve was normal in structure. Pulmonic valve regurgitation is not visualized. Aorta: The aortic root and ascending aorta are structurally normal, with no evidence of dilitation. Venous: The inferior vena cava is normal in size with greater than 50% respiratory variability, suggesting right atrial pressure of 3 mmHg. IAS/Shunts: No PFO/ASD by color doppler. Additional Comments: Spectral Doppler performed. IVC IVC diam: 1.30 cm TRICUSPID VALVE TR Peak grad:   15.1 mmHg TR Vmax:        194.00 cm/s Dalton McleanMD Electronically signed by Ezra Kanner Signature Date/Time: 01/06/2024/3:21:22 PM    Final  MR CARDIAC MORPHOLOGY  W WO CONTRAST Result Date: 01/04/2024 CLINICAL DATA:  CHF, cardiomyopathy EXAM: CARDIAC MRI TECHNIQUE: The patient was scanned on a 1.5 Tesla GE magnet. A dedicated cardiac coil was used. Functional imaging was done using Fiesta sequences. 2,3, and 4 chamber views were done to assess for RWMA's. Modified Simpson's rule using a short axis stack was used to calculate an ejection fraction on a dedicated work [employer]. The patient received 8 cc of Gadavist . After 10 minutes inversion recovery sequences were used to assess for infiltration and scar tissue. FINDINGS: Limited images of the lung fields show moderate right, small left pleural effusion. Small circumferential pericardial effusion. Mildly dilated left ventricular size with normal wall thickness. Global hypokinesis with LV EF 35%. Normal RV size with RV EF 34%. Mild biatrial enlargement. Trileaflet aortic valve, no significant stenosis, mild aortic insufficiency (regurgitant fraction 12%). Mitral regurgitant fraction 45% suggests severe mitral regurgitation. On delayed enhancement imaging, there was no myocardial late gadolinium enhancement (LGE) noted. MEASUREMENTS: MEASUREMENTS LVEDV 157 mL LVEDVi 98 mL/m2 LVSV 55 mL LVEF 35% RVEDV 153 mL RVEDVi 95 mL/m2 RVSV 53 mL RVEF 34% Aortic forward volume 30 mL Aortic regurgitant fraction 12% Global T1 1113, ECV 28% Global T2 57, mildly elevated IMPRESSION: 1.  Bilateral pleural effusions, R>L. 2.  Small pericardial effusion. 3.  Mildly dilated LV with LV EF 35%, global hypokinesis. 4.  Normal RV size with RV EF 34%. 5. No myocardial LGE, so no definitive evidence for prior MI, infiltrative disease, or myocarditis. 6. Normal extracellular volume percentage but T2 mildly elevated. Significance uncertain. 7.  Possible severe mitral regurgitation, regurgitant fraction 45%. Dalton Mclean Electronically Signed   By: Ezra Shuck M.D.   On: 01/04/2024 13:03   MR CARDIAC VELOCITY FLOW MAP Result  Date: 01/04/2024 CLINICAL DATA:  CHF, cardiomyopathy EXAM: CARDIAC MRI TECHNIQUE: The patient was scanned on a 1.5 Tesla GE magnet. A dedicated cardiac coil was used. Functional imaging was done using Fiesta sequences. 2,3, and 4 chamber views were done to assess for RWMA's. Modified Simpson's rule using a short axis stack was used to calculate an ejection fraction on a dedicated work [employer], [employer]. The patient received 8 cc of Gadavist . After 10 minutes inversion recovery sequences were used to assess for infiltration and scar tissue. FINDINGS: Limited images of the lung fields show moderate right, small left pleural effusion. Small circumferential pericardial effusion. Mildly dilated left ventricular size with normal wall thickness. Global hypokinesis with LV EF 35%. Normal RV size with RV EF 34%. Mild biatrial enlargement. Trileaflet aortic valve, no significant stenosis, mild aortic insufficiency (regurgitant fraction 12%). Mitral regurgitant fraction 45% suggests severe mitral regurgitation. On delayed enhancement imaging, there was no myocardial late gadolinium enhancement (LGE) noted. MEASUREMENTS: MEASUREMENTS LVEDV 157 mL LVEDVi 98 mL/m2 LVSV 55 mL LVEF 35% RVEDV 153 mL RVEDVi 95 mL/m2 RVSV 53 mL RVEF 34% Aortic forward volume 30 mL Aortic regurgitant fraction 12% Global T1 1113, ECV 28% Global T2 57, mildly elevated IMPRESSION: 1.  Bilateral pleural effusions, R>L. 2.  Small pericardial effusion. 3.  Mildly dilated LV with LV EF 35%, global hypokinesis. 4.  Normal RV size with RV EF 34%. 5. No myocardial LGE, so no definitive evidence for prior MI, infiltrative disease, or myocarditis. 6. Normal extracellular volume percentage but T2 mildly elevated. Significance uncertain. 7.  Possible severe mitral regurgitation, regurgitant fraction 45%. Dalton Mclean Electronically Signed   By: Ezra Shuck M.D.   On: 01/04/2024  13:03   MR CARDIAC VELOCITY FLOW MAP Result Date: 01/04/2024 CLINICAL  DATA:  CHF, cardiomyopathy EXAM: CARDIAC MRI TECHNIQUE: The patient was scanned on a 1.5 Tesla GE magnet. A dedicated cardiac coil was used. Functional imaging was done using Fiesta sequences. 2,3, and 4 chamber views were done to assess for RWMA's. Modified Simpson's rule using a short axis stack was used to calculate an ejection fraction on a dedicated work [employer] officer, [employer]. The patient received 8 cc of Gadavist . After 10 minutes inversion recovery sequences were used to assess for infiltration and scar tissue. FINDINGS: Limited images of the lung fields show moderate right, small left pleural effusion. Small circumferential pericardial effusion. Mildly dilated left ventricular size with normal wall thickness. Global hypokinesis with LV EF 35%. Normal RV size with RV EF 34%. Mild biatrial enlargement. Trileaflet aortic valve, no significant stenosis, mild aortic insufficiency (regurgitant fraction 12%). Mitral regurgitant fraction 45% suggests severe mitral regurgitation. On delayed enhancement imaging, there was no myocardial late gadolinium enhancement (LGE) noted. MEASUREMENTS: MEASUREMENTS LVEDV 157 mL LVEDVi 98 mL/m2 LVSV 55 mL LVEF 35% RVEDV 153 mL RVEDVi 95 mL/m2 RVSV 53 mL RVEF 34% Aortic forward volume 30 mL Aortic regurgitant fraction 12% Global T1 1113, ECV 28% Global T2 57, mildly elevated IMPRESSION: 1.  Bilateral pleural effusions, R>L. 2.  Small pericardial effusion. 3.  Mildly dilated LV with LV EF 35%, global hypokinesis. 4.  Normal RV size with RV EF 34%. 5. No myocardial LGE, so no definitive evidence for prior MI, infiltrative disease, or myocarditis. 6. Normal extracellular volume percentage but T2 mildly elevated. Significance uncertain. 7.  Possible severe mitral regurgitation, regurgitant fraction 45%. Dalton Mclean Electronically Signed   By: Ezra Shuck M.D.   On: 01/04/2024 13:03   DG Chest 2 View Result Date: 01/02/2024 CLINICAL DATA:  Chest pain and shortness of  breath with leg swelling. EXAM: CHEST - 2 VIEW COMPARISON:  December 22, 2023 FINDINGS: The heart size and mediastinal contours are within normal limits. Low lung volumes are noted. Both lungs are clear. The visualized skeletal structures are unremarkable. IMPRESSION: No active cardiopulmonary disease. Electronically Signed   By: Suzen Dials M.D.   On: 01/02/2024 16:50   US  Venous Img Lower Bilateral Result Date: 12/22/2023 CLINICAL DATA:  Evaluate for DVT.  Lower extremity swelling. EXAM: BILATERAL LOWER EXTREMITY VENOUS DOPPLER ULTRASOUND TECHNIQUE: Gray-scale sonography with graded compression, as well as color Doppler and duplex ultrasound were performed to evaluate the lower extremity deep venous systems from the level of the common femoral vein and including the common femoral, femoral, profunda femoral, popliteal and calf veins including the posterior tibial, peroneal and gastrocnemius veins when visible. The superficial great saphenous vein was also interrogated. Spectral Doppler was utilized to evaluate flow at rest and with distal augmentation maneuvers in the common femoral, femoral and popliteal veins. COMPARISON:  None Available. FINDINGS: RIGHT LOWER EXTREMITY Common Femoral Vein: No evidence of thrombus. Normal compressibility, respiratory phasicity and response to augmentation. Saphenofemoral Junction: No evidence of thrombus. Normal compressibility and flow on color Doppler imaging. Profunda Femoral Vein: No evidence of thrombus. Normal compressibility and flow on color Doppler imaging. Femoral Vein: No evidence of thrombus. Normal compressibility, respiratory phasicity and response to augmentation. Popliteal Vein: No evidence of thrombus. Normal compressibility, respiratory phasicity and response to augmentation. Calf Veins: No evidence of thrombus. Normal compressibility and flow on color Doppler imaging. Superficial Great Saphenous Vein: No evidence of thrombus. Normal compressibility.  Venous Reflux:  None. Other  Findings:  None. LEFT LOWER EXTREMITY Common Femoral Vein: No evidence of thrombus. Normal compressibility, respiratory phasicity and response to augmentation. Saphenofemoral Junction: No evidence of thrombus. Normal compressibility and flow on color Doppler imaging. Profunda Femoral Vein: No evidence of thrombus. Normal compressibility and flow on color Doppler imaging. Femoral Vein: No evidence of thrombus. Normal compressibility, respiratory phasicity and response to augmentation. Popliteal Vein: No evidence of thrombus. Normal compressibility, respiratory phasicity and response to augmentation. Calf Veins: No evidence of thrombus. Normal compressibility and flow on color Doppler imaging. Superficial Great Saphenous Vein: No evidence of thrombus. Normal compressibility. Venous Reflux:  None. Other Findings:  None. IMPRESSION: No evidence of deep venous thrombosis in either lower extremity. Electronically Signed   By: Greig Pique M.D.   On: 12/22/2023 18:58   DG Chest 2 View Result Date: 12/22/2023 EXAM: 2 VIEW(S) XRAY OF THE CHEST 12/22/2023 03:50:00 PM COMPARISON: 12/03/2023 CLINICAL HISTORY: Lower extremity swelling, hx CHF. Patient states c/o orthopnea x 1 month and +2 pitting edema to lower legs. Had pneumonia in the beginning of July and was hospitalized. FINDINGS: LUNGS AND PLEURA: Small bilateral pleural effusions are more conspicuous. Some patchy atelectasis or infiltrate in the lung bases is slightly increased. Suspect mild central pulmonary vascular congestion. HEART AND MEDIASTINUM: No acute abnormality of the cardiac and mediastinal silhouettes. BONES AND SOFT TISSUES: No acute osseous abnormality. IMPRESSION: 1. Small bilateral pleural effusions, more conspicuous compared to 12/03/23. 2. Slightly increased patchy atelectasis or infiltrate in the lung bases. 3. Suspect mild central pulmonary vascular congestion. Electronically signed by: Dayne Hassell MD 12/22/2023 03:53  PM EDT RP Workstation: [id number]    Microbiology: Results for orders placed or performed during the hospital encounter of 01/02/24  MRSA Next Gen by PCR, Nasal     Status: None   Collection Time: 01/05/24  5:47 PM   Specimen: Nasal Mucosa; Nasal Swab  Result Value Ref Range Status   MRSA by PCR Next Gen NOT DETECTED NOT DETECTED Final    Comment: (NOTE) The GeneXpert MRSA Assay (FDA approved for NASAL specimens only), is one component of a comprehensive MRSA colonization surveillance program. It is not intended to diagnose MRSA infection nor to guide or monitor treatment for MRSA infections. Test performance is not FDA approved in patients less than 15 years old. Performed at [facility], [address] [place], [place] [address]     Labs: CBC: Recent Labs  Lab 01/02/24 1850 01/03/24 0650 01/06/24 0350 01/07/24 0547  WBC 9.9 7.2 9.4 7.0  NEUTROABS  --   --   --  4.7  HGB 11.8* 10.6* 10.6* 11.2*  HCT 37.0 32.1* 32.3* 34.0*  MCV 95.1 94.1 92.3 93.7  PLT 174 134* 165 164   Basic Metabolic Panel: Recent Labs  Lab 01/03/24 1753 01/04/24 0853 01/05/24 0703 01/06/24 0350 01/07/24 0547  NA 143 143 144 141 141  K 3.9 3.8 3.1* 3.5 3.5  CL 105 100 99 94* 93*  CO2 27 31 30 30 25   GLUCOSE 134* 146* 85 348* 158*  BUN 54* 59* 76* 109* 119*  CREATININE 2.08* 1.99* 2.26* 3.15* 3.07*  CALCIUM  8.6* 9.0 9.5 9.3 9.0  MG  --  1.8  --   --   --    Liver Function Tests: Recent Labs  Lab 01/03/24 0650 01/03/24 1753  AST 12* 15  ALT 17 16  ALKPHOS 61 65  BILITOT 0.8 0.8  PROT 5.2* 6.0*  ALBUMIN  3.0* 3.5   CBG: Recent Labs  Lab 01/06/24 2104 01/07/24 0052 01/07/24 0443 01/07/24 0832 01/07/24 1215  GLUCAP 298* 92 127* 189* 179*    Discharge time spent: greater than 30 minutes.  Signed: Delon Herald, MD [facility] 01/07/2024

## 2024-01-07 NOTE — Progress Notes (Signed)
 Pt discharged to home with self and ambulatory. PIV removed. Discharge instructions reviewed and all questions and concerns answered.

## 2024-01-09 ENCOUNTER — Telehealth: Payer: Self-pay

## 2024-01-09 ENCOUNTER — Other Ambulatory Visit: Payer: Self-pay

## 2024-01-09 ENCOUNTER — Other Ambulatory Visit: Payer: Self-pay | Admitting: Physician Assistant

## 2024-01-09 ENCOUNTER — Encounter: Payer: Self-pay | Admitting: Cardiology

## 2024-01-09 DIAGNOSIS — I890 Lymphedema, not elsewhere classified: Secondary | ICD-10-CM

## 2024-01-09 DIAGNOSIS — M7989 Other specified soft tissue disorders: Secondary | ICD-10-CM

## 2024-01-09 DIAGNOSIS — I872 Venous insufficiency (chronic) (peripheral): Secondary | ICD-10-CM

## 2024-01-09 LAB — GLUCOSE, CAPILLARY: Glucose-Capillary: 57 mg/dL — ABNORMAL LOW (ref 70–99)

## 2024-01-09 NOTE — Transitions of Care (Post Inpatient/ED Visit) (Signed)
   01/09/2024  Name: Ashley Camacho MRN: [id number] DOB: 02-Aug-1990  Today's TOC FU Call Status:    Attempted to reach the patient regarding the most recent Inpatient/ED visit. Left a HIPAA approved voicemail message to phone number provided in demographics per DPR.    Follow Up Plan: Additional outreach attempts will be made to reach the patient to complete the Transitions of Care (Post Inpatient/ED visit) call.   Richerd Fish, RN, BSN, CCM [facility], [facility] RN Care Manager Direct Dial: [phone]

## 2024-01-09 NOTE — Telephone Encounter (Signed)
 Copied from CRM #[id number]. Topic: Referral - Status >> Jan 09, 2024 11:36 AM Charlet HERO wrote: Reason for CRM: wound care center [facility] [phone] she is calling about referral for patient for wraps but she does not have wounds and she needs to be referred to [facility] not their clinic bc she does not have open wounds.

## 2024-01-10 ENCOUNTER — Telehealth: Payer: Self-pay

## 2024-01-10 NOTE — Transitions of Care (Post Inpatient/ED Visit) (Signed)
   01/10/2024  Name: Ashley Camacho MRN: [id number] DOB: Dec 05, 1990  Today's TOC FU Call Status: Today's TOC FU Call Status:: Unsuccessful Call (2nd Attempt) Unsuccessful Call (2nd Attempt) Date: 01/10/24  Attempted to reach the patient regarding the most recent Inpatient/ED visit. Left a HIPAA approved voicemail message to phone number provided in demographics per DPR.    Follow Up Plan: Additional outreach attempts will be made to reach the patient to complete the Transitions of Care (Post Inpatient/ED visit) call.   Richerd Fish, RN, BSN, CCM [facility], [facility] RN Care Manager Direct Dial: [phone]

## 2024-01-11 ENCOUNTER — Telehealth: Payer: Self-pay

## 2024-01-11 DIAGNOSIS — R600 Localized edema: Secondary | ICD-10-CM | POA: Diagnosis not present

## 2024-01-11 DIAGNOSIS — N1832 Chronic kidney disease, stage 3b: Secondary | ICD-10-CM | POA: Diagnosis not present

## 2024-01-11 DIAGNOSIS — N179 Acute kidney failure, unspecified: Secondary | ICD-10-CM | POA: Diagnosis not present

## 2024-01-11 DIAGNOSIS — I5022 Chronic systolic (congestive) heart failure: Secondary | ICD-10-CM | POA: Diagnosis not present

## 2024-01-11 DIAGNOSIS — R809 Proteinuria, unspecified: Secondary | ICD-10-CM | POA: Diagnosis not present

## 2024-01-11 NOTE — Progress Notes (Signed)
 Follow Up Visit   Patient Name: Ashley Camacho, female   Patient DOB: [birth year] Date of Service: 01/11/2024  Patient MRN: [id number] Provider Creating Note: Bonnell Sherry, MD  [phone] Primary Care Physician:   [address] [place] [address] Additional Physicians/ Providers:    History of Present Illness Ashley Camacho is a 33 y.o. 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  We recently saw the patient at [facility] for underlying chronic kidney disease stage IIIb.  She also has past medical history of longstanding diabetes mellitus type 1, severe diabetic gastroparesis with history of G-tube placement, adrenal insufficiency, chronic systolic heart failure with history of peripartum cardiomyopathy.  She came in with significant volume overload.  She was started on Lasix  drip during her hospitalization.  She also received albumin  to augment fluid removal.  Renal function worsened over the course of the hospitalization.  Upon discharge her BUN was 119 with a creatinine of 3.07.  She was not discharged on diuretics   Medications   Current Outpatient Medications:  .  Continuous Blood Gluc Receiver (Dexcom G6 Receiver) device, USE 1 DEVICE CONTINUOUSLY, Disp: , Rfl:  .  Continuous Blood Gluc Sensor (Dexcom G6 Sensor) misc, USE 1 EACH EVERY 10 (TEN) DAYS, Disp: , Rfl:  .  Continuous Blood Gluc Transmit (Dexcom G6 Transmitter) misc, USE 1 EACH EVERY 3 (THREE) MONTHS, Disp: , Rfl:  .  divalproex  (DEPAKOTE ) 250 MG EC tablet, Take 250 mg by mouth 1 (one) time each day, Disp: , Rfl:  .  Glucagon (Baqsimi One Pack) 3 MG/DOSE powder, Administer 3 mg into affected nostril(s), Disp: , Rfl:  .  hydrALAZINE  (APRESOLINE ) 10 MG tablet, Take 10 mg by mouth in the morning and 10 mg in the evening and 10 mg before bedtime., Disp: , Rfl:  .  hydrocortisone  (CORTEF )  10 MG tablet, Take by mouth daily, Disp: , Rfl:  .  insulin  aspart (NovoLOG ) 100 UNIT/ML injection, Inject 2 Units under the skin, Disp: , Rfl:  .  Insulin  Aspart 100 UNIT/ML solution, Inject 3 Units under the skin 3 times a day, Disp: , Rfl:  .  isosorbide  mononitrate (IMDUR ) 30 MG 24 hr tablet, Take 30 mg by mouth 1 (one) time each day Do not crush or chew., Disp: , Rfl:  .  Lantus  100 UNIT/ML injection, INJECT 4 UNITS SUBCUTANEOUSLY 2 (TWO) TIMES DAILY TAKES FIRST THING IN THE MORNING, Disp: , Rfl:  .  Lantus  SoloStar 100 UNIT/ML injection, INJECT 10 UNITS SUBCUTANEOUSLY ONCE DAILY, Disp: , Rfl:  .  levothyroxine  (SYNTHROID , LEVOTHROID) 75 MCG tablet, Take 75 mcg by mouth, Disp: , Rfl:  .  metoprolol  tartrate 25 MG tablet, Take 25 mg by mouth, Disp: , Rfl:  .  pancrelipase , Lip-Prot-Amyl, (Creon ) 24000-76000 units capsule, Take 1 capsule by mouth in the morning and 1 capsule at noon and 1 capsule in the evening. Take with meals., Disp: , Rfl:  .  rosuvastatin  (CRESTOR ) 10 MG tablet, Take 10 mg by mouth 1 (one) time each day, Disp: , Rfl:    Allergies Patient has no known allergies.  Problem List Patient Active Problem List  Diagnosis  . Acute deep venous thrombosis of upper extremity (HCC)  . Acute nontraumatic kidney injury (HCC)  . Acute respiratory failure with hypoxia (HCC)  . Anemia of chronic disease  . Ascites  . Bacteremia  caused by Enterococcus  . Cellulitis of left lower limb  . Clostridium difficile diarrhea  . 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 Pulseless electrical activity (HCC)  . Gastroparesis due to diabetes mellitus (HCC)  . Past pregnancy history of cesarean section  . Heart failure with reduced ejection fraction (HCC)  . Past pregnancy history of premature labor  . Hypoadrenalism (HCC)  . Hypothyroidism  . Malnutrition (HCC)  . Oropharyngeal dysphagia  . Other restrictive cardiomyopathy (HCC)  . Peripartum cardiomyopathy  .  Pleural effusion associated with pulmonary infection  . Human metapneumovirus pneumonia  . Sepsis (HCC)  . Type 1 diabetes mellitus (HCC)  . Vitreous hemorrhage of right eye (HCC)  . Acute tubulointerstitial nephritis  . Proteinuria     Review of Systems ROS   History Past Medical History:  Diagnosis Date  . Chronic kidney disease   . Congestive heart failure (HCC)   . Essential hypertension   . History of deep vein thrombosis   . Hyposmolality and/or hyponatremia   . Urinary tract infection     Past Surgical History:  Procedure Laterality Date  . CESAREAN SECTION, CLASSIC     Family History  Problem Relation Age of Onset  . Cancer Mother   . Diabetes Father    Social History   Tobacco Use  . Smoking status: Never  . Smokeless tobacco: Never  Substance Use Topics  . Alcohol use: Not on file        Physical Exam  Vitals BP 146/83 (BP Location: Left upper arm, Patient Position: Sitting)   Pulse 66   Temp 97.7 F   Ht 5' 1 (1.549 m)   Wt 112 lb (50.8 kg)   SpO2 99%   BMI 21.16 kg/m   PHYSICAL EXAM: General appearance: well developed, well nourished, NAD Eyes: anicteric sclerae, moist conjunctivae; no lid-lag  HENT: Atraumatic; hearing intact Neck: Trachea midline; supple Lungs: CTAB, with normal respiratory effort  CV: S1S2, no murmurs or rubs. Abdomen: Soft, non-tender; bowel sounds present Extremities: No peripheral edema Skin: Warm and dry, normal skin turgor, no rashes noted. Psych: Appropriate affect, alert and oriented to person, place and time    Laboratory Studies  Chemistry  Lab Units 08/04/23 1104  MAGNESIUM  mg/dL 2.0  PHOSPHORUS mg/dL 4.9        No lab exists for component: IRON  SATURATION, TRANSSATPER          No lab exists for component: GLUCOSEUR, BILIRUBINUR, SPECGRAV, RBCUR, LEUKOCYTESUR, NITRITE  Lab Results  Component Value Date   CALCIUM  8.5 (L) 04/02/2021   PHOS 4.9 08/04/2023     Imaging  and Other Studies     Orders Placed This Encounter  . Renal Function Panel  . CBC and Differential  . PTH, Intact  . Protein, Total, Random Urine w/Creatinine (Protein/Creat Ratio)  . Ambulatory referral to Vascular Surgery       Impression/Recommendations  Sharonlee Nine is a 33 y.o. female with past medical history of longstanding diabetes mellitus type 1, chronic systolic heart failure, hypertension, severe gastroparesis with history of G-tube placement, history of chronic kidney disease stage IIIb who presents today in follow-up.  1.  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  Patient [facility] significant [facility]ures[facility] and [facility].  Her renal function unfortunately worsened with diuresis.  Therefore this was held at discharge.  Today she is found to be breathing comfortably and  has Unna boots on to treat her lower extremity edema.  We are hopeful that her renal function will have recovered as she was undergoing aggressive diuresis previously.  Her baseline creatinine appears to be 1.8-1.9.  She has associated proteinuria as well and we will consider adding back an ARB once her renal function has stabilized.  2.  Chronic systolic heart failure.  Most recent ejection fraction was 35%.  We counseled the patient on salt and water  restriction today given the fact that she is not on diuretics at the moment.  We highly recommended that she follow-up with cardiology in the near future.  3.  Lower extremity edema.  She currently has Unna boots on.  She did go to the wound care center however she was told that they do not manage Unna boots.  Will refer the patient over to vascular surgery to assist with this issue.   Return in about 6 weeks (around 02/22/2024).   Munsoor Lateef, MD

## 2024-01-11 NOTE — Transitions of Care (Post Inpatient/ED Visit) (Signed)
   01/11/2024  Name: Ashley Camacho MRN: [id number] DOB: 03-Apr-1991  Today's TOC FU Call Status: Today's TOC FU Call Status:: Unsuccessful Call (3rd Attempt) Unsuccessful Call (3rd Attempt) Date: 01/11/24  Attempted to reach the patient regarding the most recent Inpatient/ED visit. Left a HIPAA approved voicemail message to phone number provided in demographics per DPR.    Follow Up Plan: No further outreach attempts will be made at this time. We have been unable to contact the patient.  Richerd Fish, RN, BSN, CCM [facility], [facility] RN Care Manager Direct Dial: [phone]

## 2024-01-13 NOTE — Addendum Note (Signed)
 Addended by: LILIAN SEVERO RAMAN on: 01/13/2024 03:32 PM   Modules accepted: Orders

## 2024-01-17 ENCOUNTER — Telehealth: Payer: Self-pay | Admitting: Family

## 2024-01-17 NOTE — Telephone Encounter (Signed)
 Called to confirm/remind patient of their appointment at the [facility] on 01/18/24.   Appointment:   [x] Confirmed  [] Left mess   [] No answer/No voice mail  [] VM Full/unable to leave message  [] Phone not in service  Patient reminded to bring all medications and/or complete list.  Confirmed patient has transportation. Gave directions, instructed to utilize valet parking.

## 2024-01-18 ENCOUNTER — Ambulatory Visit (INDEPENDENT_AMBULATORY_CARE_PROVIDER_SITE_OTHER): Admitting: Physician Assistant

## 2024-01-18 ENCOUNTER — Ambulatory Visit: Attending: Family | Admitting: Family

## 2024-01-18 ENCOUNTER — Encounter: Payer: Self-pay | Admitting: Physician Assistant

## 2024-01-18 VITALS — BP 142/72 | HR 70 | Ht 61.0 in | Wt 118.2 lb

## 2024-01-18 VITALS — BP 151/83 | HR 67 | Wt 118.0 lb

## 2024-01-18 DIAGNOSIS — E2740 Unspecified adrenocortical insufficiency: Secondary | ICD-10-CM

## 2024-01-18 DIAGNOSIS — N186 End stage renal disease: Secondary | ICD-10-CM | POA: Insufficient documentation

## 2024-01-18 DIAGNOSIS — Z09 Encounter for follow-up examination after completed treatment for conditions other than malignant neoplasm: Secondary | ICD-10-CM | POA: Diagnosis not present

## 2024-01-18 DIAGNOSIS — E108 Type 1 diabetes mellitus with unspecified complications: Secondary | ICD-10-CM

## 2024-01-18 DIAGNOSIS — Z794 Long term (current) use of insulin: Secondary | ICD-10-CM

## 2024-01-18 DIAGNOSIS — Z992 Dependence on renal dialysis: Secondary | ICD-10-CM | POA: Insufficient documentation

## 2024-01-18 DIAGNOSIS — E1022 Type 1 diabetes mellitus with diabetic chronic kidney disease: Secondary | ICD-10-CM | POA: Insufficient documentation

## 2024-01-18 DIAGNOSIS — K3184 Gastroparesis: Secondary | ICD-10-CM | POA: Diagnosis not present

## 2024-01-18 DIAGNOSIS — E039 Hypothyroidism, unspecified: Secondary | ICD-10-CM

## 2024-01-18 DIAGNOSIS — N184 Chronic kidney disease, stage 4 (severe): Secondary | ICD-10-CM

## 2024-01-18 DIAGNOSIS — E109 Type 1 diabetes mellitus without complications: Secondary | ICD-10-CM | POA: Diagnosis not present

## 2024-01-18 DIAGNOSIS — E782 Mixed hyperlipidemia: Secondary | ICD-10-CM

## 2024-01-18 DIAGNOSIS — I428 Other cardiomyopathies: Secondary | ICD-10-CM | POA: Insufficient documentation

## 2024-01-18 DIAGNOSIS — I120 Hypertensive chronic kidney disease with stage 5 chronic kidney disease or end stage renal disease: Secondary | ICD-10-CM | POA: Insufficient documentation

## 2024-01-18 DIAGNOSIS — E1021 Type 1 diabetes mellitus with diabetic nephropathy: Secondary | ICD-10-CM | POA: Insufficient documentation

## 2024-01-18 DIAGNOSIS — I5042 Chronic combined systolic (congestive) and diastolic (congestive) heart failure: Secondary | ICD-10-CM | POA: Diagnosis not present

## 2024-01-18 DIAGNOSIS — I340 Nonrheumatic mitral (valve) insufficiency: Secondary | ICD-10-CM | POA: Diagnosis not present

## 2024-01-18 DIAGNOSIS — I5020 Unspecified systolic (congestive) heart failure: Secondary | ICD-10-CM

## 2024-01-18 DIAGNOSIS — Z79899 Other long term (current) drug therapy: Secondary | ICD-10-CM | POA: Diagnosis not present

## 2024-01-18 DIAGNOSIS — I10 Essential (primary) hypertension: Secondary | ICD-10-CM

## 2024-01-18 DIAGNOSIS — E785 Hyperlipidemia, unspecified: Secondary | ICD-10-CM | POA: Insufficient documentation

## 2024-01-18 DIAGNOSIS — E1043 Type 1 diabetes mellitus with diabetic autonomic (poly)neuropathy: Secondary | ICD-10-CM | POA: Diagnosis not present

## 2024-01-18 DIAGNOSIS — Z0100 Encounter for examination of eyes and vision without abnormal findings: Secondary | ICD-10-CM

## 2024-01-18 MED ORDER — ROSUVASTATIN CALCIUM 10 MG PO TABS: 10.0000 mg | ORAL_TABLET | Freq: Every day | ORAL | 11 refills | Status: AC

## 2024-01-18 MED ORDER — VALSARTAN 40 MG PO TABS: 20.0000 mg | ORAL_TABLET | Freq: Every day | ORAL | 3 refills | Status: DC

## 2024-01-18 NOTE — Progress Notes (Signed)
 [facility] Note   PCP: Ostwalt, [name], PA-C HF Cardiology: Dr. Rolan  Chief complaint: shortness of breath   HPI:  [name] is a 33 y.o. female with a history of type 1 diabetes, CKD stage 3/diabetic nephropathy, diabetic gastroparesis, adrenal insufficiency, and CHF was referred by Dr. Claudene in the [facility] ER for evaluation of CHF. She has a rather complex history. Echo in 7/22 after the birth of her second daughter showed EF 40-45%. This had declined to 25% by 2/23, ?peripartum CMP. Cath in 2/23 at [facility] showed no significant CAD. She had AKI in 3/23 requiring transient dialysis, possible acute interstitial nephritis. She also had Enterococcal bacteremia that admission in 3/23. She has numerous diabetic complications including gastroparesis, nephropathy, and neuropathy.    She was hospitalized at [facility] in 6/25 for possible PNA. She was seen in the ER 12/22/23 with dyspnea and LE edema. She says the edema had been present since 6/25 admission and she think she got too much IVF in the hospital. LE US  showed not DVT.  CXR showed vascular congestion. BNP was severely elevated. Interestingly, patient had had an echo in 6/25 at [facility] that I reviewed; this was fairly normal with EF 55-60%, GLS -23.4%, normal diastolic function, RV normal, mild-moderate MR, IVC normal. Echo did not match clinical picture. Patient was started on Lasix  20 mg daily when she was sent home from the ER.   Admitted 01/02/24 with HF exacerbation. Placed on lasix  drip with subsequent worsening renal function. Elevated troponin thought to be due to demand ischemia. cMRI 01/04/24: EF 35% with global hypokinesis and concern for severe MR  TEE 01/06/24 with EF 35-40%, no thrombus, no PFO/ASD, no significant valvular disease. Spironolactone / losartan  stopped due to renal function. GDMT limited by renal function. Hydralazine / imdur  started for afterload reduction.   She presents today for a HF follow-up visit with a chief  complaint of shortness of breath. Has occasional chest pain, palpitations, pedal edema. Sleeping ok on 2 pillows. Has noticed pedal edema getting a little worse since discharge when her medications were held. She had to stop wearing her UNNA boots because they became dirty and hasn't gotten compression socks yet. Has a 2 yr daughter and 20 y/o daughter.   Has upcoming vascular appointment next week. Saw nephrology last week and had lab work done.   ECG not done  Labs (7/25): HS-TnI 19 => 20, BNP 4236, K 5.2, creatinine 1.82 Labs (8/25): 5.1 =>4.1, creatinine 1.8=>3.07=> 2.47, BNP >4500, HS-Trop 28  PMH: 1. Type 1 diabetes with h/o DKA.  2. Diabetic gastroparesis 3. Adrenal insufficiency: She is on hydrocortisone  4. CKD stage 3: Diabetic nephropathy.  5. Chronic HF with recovered EF: H/o nonischemic cardiomyopathy.  - Echo (7/22): EF 40-45% => ?peri-partum cardiomyopathy.  - Echo (2/23): EF 25% - LHC (2/23, [facility]): no significant CAD.  - Echo (3/23): EF 20% - Echo (10/23): EF 30-35% ([facility]) - Echo (6/25): EF 55-60%, GLS -23.4%, normal diastolic function, RV normal, mild-moderate MR, IVC normal 6. Diabetic neuropathy.  7. PEA arrest after EGD in 3/23.  8. AKI in 3/23 from possible acute interstitial nephritis, required transient HD.  9. Enterococcal bacteremia in 3/23.  10. RUE DVT in 2022.   SH: Lives with roommate in [place], 2 daughters, nonsmoker, no ETOH.  Not working.   FH: Father with type 1 diabetes, CAD with stent around age 22, died with cardiac arrest around age 43. Sister with SVT.   ROS: All systems reviewed and  negative except as per HPI.   Current Outpatient Medications  Medication Sig Dispense Refill   acetaminophen  (TYLENOL ) 500 MG tablet Take 2 tablets (1,000 mg total) by mouth every 6 (six) hours as needed for headache, fever or moderate pain. 30 tablet 3   BAQSIMI ONE PACK 3 MG/DOSE POWD Place 3 mg into the nose as needed.     BD PEN NEEDLE MINI ULTRAFINE 31G X 5  MM MISC by Other route.     Continuous Glucose Sensor (DEXCOM G7 SENSOR) MISC SMARTSIG:1 Each Every 10 Days     divalproex  (DEPAKOTE ) 250 MG DR tablet Take 250 mg by mouth in the morning.     [Paused] furosemide  (LASIX ) 20 MG tablet Take 2 tablets (40 mg total) by mouth daily. 60 tablet 5   hydrALAZINE  (APRESOLINE ) 10 MG tablet Take 1 tablet (10 mg total) by mouth with breakfast, with lunch, and with evening meal. 90 tablet 0   hydrocortisone  (CORTEF ) 10 MG tablet Take 2 tablets (20 mg total) by mouth daily with breakfast AND 1 tablet (10 mg total) daily with supper. 90 tablet 0   insulin  aspart (NOVOLOG ) 100 UNIT/ML FlexPen Inject 2 Units into the skin 3 (three) times daily with meals. If eating and Blood Glucose (BG) 80 or higher inject 2 units for meal coverage and add correction dose per scale. If not eating, correction dose only. BG <150= 0 unit; BG 150-200= 1 unit; BG 201-250= 3 unit; BG 251-300= 5 unit; BG 301-350= 7 unit; BG 351-400= 9 unit; BG >400= 11 unit and Call Primary Care.     insulin  glargine (LANTUS ) 100 UNIT/ML Solostar Pen Inject 10 Units into the skin daily.     Insulin  Pen Needle (PEN NEEDLES) 31G X 6 MM MISC PEN NEEDLES 31G X 6 MM     isosorbide  mononitrate (IMDUR ) 30 MG 24 hr tablet Take 0.5 tablets (15 mg total) by mouth daily. 30 tablet 0   levothyroxine  (SYNTHROID ) 75 MCG tablet TAKE 1 TABLET (75 MCG TOTAL) BY MOUTH DAILY AT 6 (SIX) AM. 90 tablet 0   metoprolol  succinate (TOPROL -XL) 25 MG 24 hr tablet Take 1 tablet (25 mg total) by mouth daily. 30 tablet 0   Pancrelipase , Lip-Prot-Amyl, 24000-76000 units CPEP Take 1 capsule (24,000 Units total) by mouth 3 (three) times daily before meals. 270 capsule 0   rosuvastatin  (CRESTOR ) 10 MG tablet Take 1 tablet (10 mg total) by mouth daily. 30 tablet 0   [Paused] spironolactone  (ALDACTONE ) 25 MG tablet Take 0.5 tablets (12.5 mg total) by mouth daily. 45 tablet 3   No current facility-administered medications for this visit.   LMP  11/28/2023 (Approximate)   Vitals:   01/18/24 1040  BP: (!) 151/83  Pulse: 67  SpO2: 100%  Weight: 118 lb (53.5 kg)   Wt Readings from Last 3 Encounters:  01/18/24 118 lb (53.5 kg)  01/07/24 125 lb (56.7 kg)  12/26/23 130 lb (59 kg)   Lab Results  Component Value Date   CREATININE 3.07 (H) 01/07/2024   CREATININE 3.15 (H) 01/06/2024   CREATININE 2.26 (H) 01/05/2024   Physical exam:  General: Well appearing.  Cor: No JVD. Regular rhythm, rate.  Lungs: clear Abdomen: soft, nontender, nondistended. Extremities: trace pitting edema bilateral lower legs  Neuro:. Affect pleasant   Assessment/Plan:  1. Chronic HF with reduced EF: Patient has history of nonischemic cardiomyopathy that was primarily followed at [facility] in the past. There was concern that this could be a peri-partum cardiomyopathy. Echo in  7/22 after the birth of her second daughter showed EF 40-45%.  This had declined to 25% by 2/23, ?peripartum CMP. Cath in 2/23 at [facility] showed no significant CAD.  However, echo in 6/25 at [facility] was reviewed and noted to be relatively normal except for mild to moderate MR => EF 55-60%, GLS -23.4%, normal diastolic function, RV normal, mild-moderate MR, IVC normal.  Despite the unremarkable echo, she was seen in the ER 07/25 with a very high BNP. Recent admission 08/25 for HF exacerbation. Very high BNP - cMRI 01/04/24: EF 35% with global hypokinesis and concern for severe MR, No myocardial LGE, so no definitive evidence for prior MI, infiltrative disease, or myocarditis,  Normal RV size with RV EF 34%.  - TEE 01/06/24 with EF 35-40%, no thrombus, no PFO/ASD, no significant valvular disease.  - NYHA class II symptoms.   - euvolemic  - furosemide  currently on hold - resume metoprolol  succinate 25mg  daily - Continue to hold spironolactone  12.5 daily due to CKD.  - Since renal function improving, begin valsartan  20mg  daily for BP / renal protection. BMET in 1 week.  - GDMT will be limited by  CKD.  - She is not a candidate for SGLT2 inhibitor with type 1 diabetes.  - She has a strong family history of premature CAD (father) and type 1 diabetes. Given exertional symptoms, I will arrange for cardiac PET to assess for ischemia is currently pending insurance approval. She had a cath in 02/23 without significant CAD.  - Has to get compression socks. Has vascular appt next week regarding UNNA boots - BNP 01/02/24 was >4500. BNP next week when BMET checked 2. Hyperlipidemia:  - LDL 12/26/23 was 84 - given type 1 diabetes. Begin rosuvastatin  10mg  daily 3. CKD stage 4: Diabetic nephropathy.  - BMET 01/11/24 reviewed: sodium 148, potassium 4.1, creatinine 2.47 & GFR 26. BMET next week 4. Type 1 diabetes: Continue insulin  per PCP.  - A1c 10/09/23 was 8.0% 5. HTN. - BP 151/83 - continue hydralazine  10mg  TID/ isosorbide  MN 15mg  daily - Start valsartan  20mg  with BMET in 1 week.    Keep already scheduled f/u appt in a few weeks. Sooner if needed.   Ellouise DELENA Class, FNP 01/18/24

## 2024-01-18 NOTE — Progress Notes (Signed)
 S:     Chief Complaint  Patient presents with   Medication Management    Diabetes, Hypertension    Reason for visit: ?  Ashley Camacho is a 33 y.o. female with a history of diabetes (type 1), who presents today for an initial diabetes pharmacotherapy visit.? Pertinent PMH also includes HFrEF, HTN, MVR, CKD stage 4, anxiety, and depression.  Known DM Complications: gastroparesis, nephropathy, peripheral neuropathy, and retinopathy   Care Team: Primary Care Provider: Ostwalt, [name], PA-C  Previously she was followed by Glenys Curlin, NP at [facility], last visit 09/02/21. Prior to that she was followed by Dr Arnulfo Kells at [facility]. Currently managed by Dr. Standley at [facility]. Last visit was 07/2023.  At that visit in 07/2023, CGM report demonstrated that typical hypoglycemic events were following hyperglycemia episodes, demonstrating overcorrection. She was instructed to administer Novolog  prior to eating. Per endocrinology, she previously used Tandem insulin  pump, but has been on MDI for several years. Insulin  administration has been complicated by gastroparesis and continuous nocturnal tube feeds. She is no longer administering nocturnal tube feeds. Novolog  ICR was 1:15  and ISF was 1:80 with correct above 110 mg/dL.   In the interim, patient has been administering a TDD of 18 units of insulin . This consists of Lantus  10 units daily and Novolog  2 units with breakfast, 2 units with lunch, and 4 units with dinner. She has been correcting above 150 mg/dL.   Dexcom was placed yesterday after samples were given in clinic. She had not been able to receive any from the pharmacy in the interim. She also reports running out of fingerstick BG testing supplies, requiring her to purchase OTC supplies.   Of note, during clinic today she did become hypoglycemic with BG readings of 49 mg/dL and subsequent 71 mg/dL per fingerstick. First reading was 15 minutes after consumption of  glucose tab. Patient required two 15 gram glucose tabs. Patient drank a Glucerna protein shake when BG went above 70 mg/dL.  Patient reports Diabetes was diagnosed at age 67  Current diabetes medications include: Lantus  10 units daily, Novolog  per sliding scale Current hypertension medications include: valsartan  20 mg daily (has not started yet), metoprolol  succinate 25 mg daily, hydralazine  10 mg TID On hold: spironolactone  (CKD) and furosemide  (euvolemic) Current hyperlipidemia medications include: rosuvastatin  10 mg daily  Patient reports adherence to taking all medications as prescribed, except has not started valsartan .  Have you been experiencing any side effects to the medications prescribed? no Do you have any problems obtaining medications due to transportation or finances? no Insurance coverage: [place] Medicaid  Patient reports hypoglycemic events.  Patient reported dietary habits: Eats 3 meals/day Breakfast (8-9am): eggs with toast  Lunch (1pm): sandwich, chicken nuggets Dinner (5-6p-biggest meal of the day): stir fry, protein, carb, and vegetable  Patient-reported exercise habits: walks around her neighborhood for 30 minutes a day DM Prevention:  Statin: Taking; moderate intensity.?  History of chronic kidney disease? yes History of albuminuria? no, last UACR on 05/10/23 = 22 mg/g Last eye exam: Retinopathy Present Tobacco Use:   Tobacco Use: Low Risk  (01/18/2024)   Patient History    Smoking Tobacco Use: Never    Smokeless Tobacco Use: Never    Passive Exposure: Never   O:  Dexcom Clarity Report:    Observed patterns: difficult to assess d/t less than a day of data  Vitals:  Wt Readings from Last 3 Encounters:  01/18/24 118 lb 3.2 oz (53.6 kg)  01/18/24 118 lb (53.5 kg)  01/07/24 125 lb (56.7 kg)   BP Readings from Last 3 Encounters:  01/19/24 114/71  01/18/24 (!) 142/72  01/18/24 (!) 151/83   Pulse Readings from Last 3 Encounters:  01/19/24 73  01/18/24  70  01/18/24 67     Labs:?  Lab Results  Component Value Date   HGBA1C 8.0 (H) 10/09/2023   HGBA1C 10.3 (H) 05/10/2023   HGBA1C 9.4 (H) 11/02/2022   GLUCOSE 158 (H) 01/07/2024   MICRALBCREAT 22 05/10/2023   CREATININE 3.07 (H) 01/07/2024   CREATININE 3.15 (H) 01/06/2024   CREATININE 2.26 (H) 01/05/2024    Lab Results  Component Value Date   CHOL 171 12/26/2023   LDLCALC 84 12/26/2023   LDLCALC 49 05/10/2023   LDLCALC 69 12/21/2020   HDL 65 12/26/2023   TRIG 112 12/26/2023   TRIG 249 (H) 05/10/2023   TRIG 107 12/21/2020   ALT 16 01/03/2024   ALT 17 01/03/2024   AST 15 01/03/2024   AST 12 (L) 01/03/2024      Chemistry      Component Value Date/Time   NA 141 01/07/2024 0547   NA 145 (H) 12/21/2023 1438   NA 133 (L) 03/01/2013 1844   K 3.5 01/07/2024 0547   K 4.1 03/01/2013 1844   CL 93 (L) 01/07/2024 0547   CL 98 03/01/2013 1844   CO2 25 01/07/2024 0547   CO2 30 03/01/2013 1844   BUN 119 (H) 01/07/2024 0547   BUN 35 (H) 12/21/2023 1438   BUN 13 03/01/2013 1844   CREATININE 3.07 (H) 01/07/2024 0547   CREATININE 0.76 03/01/2013 1844      Component Value Date/Time   CALCIUM  9.0 01/07/2024 0547   CALCIUM  9.1 03/01/2013 1844   ALKPHOS 65 01/03/2024 1753   ALKPHOS 117 03/01/2013 1844   AST 15 01/03/2024 1753   AST 32 03/01/2013 1844   ALT 16 01/03/2024 1753   ALT 26 03/01/2013 1844   BILITOT 0.8 01/03/2024 1753   BILITOT 0.3 12/21/2023 1438   BILITOT 0.5 03/01/2013 1844       The ASCVD Risk score (Arnett DK, et al., 2019) failed to calculate for the following reasons:   The 2019 ASCVD risk score is only valid for ages 4 to 24   Risk score cannot be calculated because patient has a medical history suggesting prior/existing ASCVD  Lab Results  Component Value Date   MICRALBCREAT 22 05/10/2023    A/P: Diabetes currently uncontrolled with a most recent A1c of 8.0% on 10/09/23. Patient is able to verbalize appropriate hypoglycemia management plan.  Medication adherence appears appropriate. Control is suboptimal due to use of sliding scale Novolog . Patient endorses frequent incidences of hypo and hyperglycemia while on sliding scale regimen. Current TDD = 18 units. Calculated insulin  to carbohydrate ratio (ICR) 27.78 (500/18). Calculated insulin  sensitivity factor (ISF) is 100 (1800/18). Will plan to increase correct above goal closer to previous goal set by endocrinology.  -Continued basal insulin  Lantus /Basaglar /Semglee  (insulin  glargine) 10 units daily -Adjusted dose of rapid insulin  Novolog  (insulin  aspart)   ICR 1:28, ISF 1:100 with correct above 125 mg/dL  Max bolus: 6 units -Downloaded and uploaded both ICR and ISF into BolusCalc application. -Provided education and resources on carbohydrate counting. Reviewed nutrition labels. -Extensively discussed pathophysiology of diabetes, recommended lifestyle interventions, dietary effects on blood sugar control.  -Counseled on s/sx of and management of hypoglycemia.  -Submitted refill of Dexcom G7 and fingerstick BG supplies to pharmacy  ASCVD  risk - primary prevention in patient with diabetes. Last LDL is 84 mg/dL, not at goal of <29 mg/dL.  -Continued rosuvastatin  10 mg daily.   Hypertension longstanding currently controlled. Blood pressure goal of <130/80 mmHg. Medication adherence appropriate, however, she has not yet started valsartan . -Continued valsartan  40 mg daily. Please pick this up from the pharmacy -Continued hydralazine  10 mg TID. -Continued metoprolol  succinate 25 mg daily. -BP cuff ordered through [facility]  Written patient instructions provided. Patient verbalized understanding of treatment plan.  Total time in face to face counseling 60 minutes.     Follow-up:  Pharmacist on 01/26/24 PCP clinic visit on 03/19/24  [name], PharmD Clinical Pharmacist [facility] [phone]

## 2024-01-18 NOTE — Progress Notes (Signed)
 Established patient visit  Patient: Ashley Camacho   DOB: [birth year]   33 y.o. Female  MRN: [id number] Visit Date: 01/18/2024  Today's healthcare provider: Jolynn Spencer, PA-C   Chief Complaint  Patient presents with   Medical Management of Chronic Issues    Patient presents for chronic care follow up.  Reports that she was recently in hospital diagnosed with CHF, states she has been okay since hospital visit. Saw CHF clinic today.  Patient is out of dexcom sensors, endo is doing prior serbia. Please discuss need/ recommendation for prevnar and HPV vaccines.   Subjective     Discussed the use of AI scribe software for clinical note transcription with the patient, who gave verbal consent to proceed.  History of Present Illness Ashley Camacho is a 33 year old female with heart failure and diabetes who presents for follow-up of her blood pressure management and medication review.  She experiences frequent hospital visits and finds changes in her medication regimen confusing. She takes metoprolol , hydralazine , and Imdur , which were started during a recent hospital stay, but is unsure about the changes, particularly the use of hydralazine  and Imdur  for heart failure. Lopressor  was not given during her hospital stay, which adds to her confusion.  She has chest pain and shortness of breath, which have slightly improved. Leg swelling has significantly subsided since her last hospital visit, with some remaining around the ankles. Occasional shortness of breath persists.  Her last diabetes check showed elevated levels. She is awaiting an appointment with a new endocrinologist and has not seen a medical nutritionist for diabetes and heart health. She eats well and denies significant dietary issues. Nephrology advised that her kidney function does not require intervention but recommended a heart-healthy diet.       01/18/2024    1:10 PM 12/21/2023    1:42 PM 10/21/2023    2:58 PM  Depression  screen PHQ 2/9  Decreased Interest 1 1 0  Down, Depressed, Hopeless 2 2 2   PHQ - 2 Score 3 3 2   Altered sleeping 2 1 2   Tired, decreased energy 2 2 2   Change in appetite 1 2 0  Feeling bad or failure about yourself  2 2 2   Trouble concentrating 2 2 0  Moving slowly or fidgety/restless 2 2 2   Suicidal thoughts 0 1 0  PHQ-9 Score 14 15 10   Difficult doing work/chores Somewhat difficult Somewhat difficult Somewhat difficult      01/18/2024    1:10 PM 12/21/2023    1:43 PM 10/21/2023    2:58 PM 05/10/2023   10:27 AM  GAD 7 : Generalized Anxiety Score  Nervous, Anxious, on Edge 3 2 2 2   Control/stop worrying 2 2 2 2   Worry too much - different things 2 2 3 2   Trouble relaxing 3 1 1 1   Restless 3 2 2 1   Easily annoyed or irritable 3 2 1 3   Afraid - awful might happen 0 0 0 0  Total GAD 7 Score 16 11 11 11   Anxiety Difficulty Somewhat difficult Somewhat difficult Somewhat difficult Very difficult    Medications: Outpatient Medications Prior to Visit  Medication Sig   acetaminophen  (TYLENOL ) 500 MG tablet Take 2 tablets (1,000 mg total) by mouth every 6 (six) hours as needed for headache, fever or moderate pain.   BAQSIMI ONE PACK 3 MG/DOSE POWD Place 3 mg into the nose as needed.   BD PEN NEEDLE MINI ULTRAFINE 31G X 5 MM MISC by  Other route.   Continuous Glucose Sensor (DEXCOM G7 SENSOR) MISC SMARTSIG:1 Each Every 10 Days   divalproex  (DEPAKOTE ) 250 MG DR tablet Take 250 mg by mouth in the morning.   hydrALAZINE  (APRESOLINE ) 10 MG tablet Take 1 tablet (10 mg total) by mouth with breakfast, with lunch, and with evening meal.   hydrocortisone  (CORTEF ) 10 MG tablet Take 2 tablets (20 mg total) by mouth daily with breakfast AND 1 tablet (10 mg total) daily with supper.   insulin  aspart (NOVOLOG ) 100 UNIT/ML FlexPen Inject 2 Units into the skin 3 (three) times daily with meals. If eating and Blood Glucose (BG) 80 or higher inject 2 units for meal coverage and add correction dose per scale.  If not eating, correction dose only. BG <150= 0 unit; BG 150-200= 1 unit; BG 201-250= 3 unit; BG 251-300= 5 unit; BG 301-350= 7 unit; BG 351-400= 9 unit; BG >400= 11 unit and Call Primary Care.   insulin  glargine (LANTUS ) 100 UNIT/ML Solostar Pen Inject 10 Units into the skin daily.   Insulin  Pen Needle (PEN NEEDLES) 31G X 6 MM MISC PEN NEEDLES 31G X 6 MM   isosorbide  mononitrate (IMDUR ) 30 MG 24 hr tablet Take 0.5 tablets (15 mg total) by mouth daily.   levothyroxine  (SYNTHROID ) 75 MCG tablet TAKE 1 TABLET (75 MCG TOTAL) BY MOUTH DAILY AT 6 (SIX) AM.   Pancrelipase , Lip-Prot-Amyl, 24000-76000 units CPEP Take 1 capsule (24,000 Units total) by mouth 3 (three) times daily before meals.   rosuvastatin  (CRESTOR ) 10 MG tablet Take 1 tablet (10 mg total) by mouth daily.   valsartan  (DIOVAN ) 40 MG tablet Take 0.5 tablets (20 mg total) by mouth daily.   [Paused] furosemide  (LASIX ) 20 MG tablet Take 2 tablets (40 mg total) by mouth daily. (Patient not taking: Reported on 01/18/2024)   metoprolol  succinate (TOPROL -XL) 25 MG 24 hr tablet Take 1 tablet (25 mg total) by mouth daily. (Patient not taking: Reported on 01/18/2024)   [Paused] spironolactone  (ALDACTONE ) 25 MG tablet Take 0.5 tablets (12.5 mg total) by mouth daily. (Patient not taking: Reported on 01/18/2024)   No facility-administered medications prior to visit.    Review of Systems All negative Except see HPI       Objective    BP (!) 156/81 (BP Location: Left Arm, Patient Position: Sitting, Cuff Size: Normal)   Pulse 70   Ht 5' 1 (1.549 m)   Wt 118 lb 3.2 oz (53.6 kg)   LMP 11/28/2023 (Approximate)   SpO2 100%   BMI 22.33 kg/m     Physical Exam Vitals reviewed.  Constitutional:      General: She is not in acute distress.    Appearance: Normal appearance. She is well-developed. She is not diaphoretic.  HENT:     Head: Normocephalic and atraumatic.  Eyes:     General: No scleral icterus.    Conjunctiva/sclera: Conjunctivae  normal.  Neck:     Thyroid : No thyromegaly.  Cardiovascular:     Rate and Rhythm: Normal rate and regular rhythm.     Pulses: Normal pulses.     Heart sounds: Normal heart sounds. No murmur heard. Pulmonary:     Effort: Pulmonary effort is normal. No respiratory distress.     Breath sounds: Normal breath sounds. No wheezing, rhonchi or rales.  Musculoskeletal:     Cervical back: Neck supple.     Right lower leg: No edema.     Left lower leg: No edema.  Lymphadenopathy:  Cervical: No cervical adenopathy.  Skin:    General: Skin is warm and dry.     Findings: No rash.  Neurological:     Mental Status: She is alert and oriented to person, place, and time. Mental status is at baseline.  Psychiatric:        Mood and Affect: Mood normal.        Behavior: Behavior normal.      No results found for any visits on 01/18/24.      Assessment & Plan Hospital discharge follow-up  Heart failure with hypertension and lower extremity edema Heart failure with hypertension and persistent lower extremity edema. Blood pressure remains elevated despite current medications. Recent hospitalization for exacerbation. Chest pain linked to heart failure. - Resume metoprolol  and start valsartan  as per cardiology recommendation. - Continue hydralazine  and Imdur . - Measure blood pressure regularly. - Consult with clinic pharmacist for medication management. - Schedule follow-up in six weeks for blood pressure control. - See vascular surgeon for leg swelling. - Consider compression stockings for edema management.  Type 1 diabetes mellitus Chronic and unstable Type 1 diabetes with poor glycemic control, HbA1c 8.0. Insulin  management may need adjustment before endocrinology visit. - Consult with clinic pharmacist for diabetes management. - Adjust insulin  dosage as needed before endocrinology appointment. - Schedule appointment with endocrinologist on October 6th.  Adrenal  insufficiency Chronic Adrenal insufficiency requiring ongoing endocrinology management. - Ensure endocrinology follow-up for adrenal insufficiency management.  Hypothyroidism Chronic Hypothyroidism managed with levothyroxine . Recent dose adjustment made. Monitoring needed to ensure appropriate dosing. - Repeat TSH to assess levothyroxine  dosing. - Continue levothyroxine  therapy.  Mitral valve insufficiency with murmur Mitral valve insufficiency with murmur. No current chest pain or significant shortness of breath. - Monitor for any changes in symptoms such as chest pain or shortness of breath. Follow-up with cardiology  Primary hypertension (Primary)  - AMB Referral [facility]I Care Management - TSH + free T4  Adrenal insufficiency (HCC) - AMB Referral [facility]BCI Care Management  Type 1 diabetes mellitus without complications (HCC) - AMB Referral [facility]BCI Care Management   Encounter for complete eye exam  - Ambulatory referral to Ophthalmology  No orders of the defined types were placed in this encounter.   Return for F/u and pap.   The patient was advised to call back or seek an in-person evaluation if the symptoms worsen or if the condition fails to improve as anticipated.  I discussed the assessment and treatment plan with the patient. The patient was provided an opportunity to ask questions and all were answered. The patient agreed with the plan and demonstrated an understanding of the instructions.  I, Maurene Hollin, PA-C have reviewed all documentation for this visit. The documentation on 01/18/2024  for the exam, diagnosis, procedures, and orders are all accurate and complete.  Jolynn Spencer, [facility], MMS [facility] [phone] (phone) [phone] (fax)  [facility]

## 2024-01-18 NOTE — Patient Instructions (Addendum)
 Medication Changes:  Resume Metoprolol   Start Valsartan  20 MG once daily  Start Crestor  10 MG once daily   Lab Work:  Go over to the [facility]. Go pass the gift shop and have your blood work completed.  We will only call you if the results are abnormal or if the provider would like to make medication changes.  No news is good news.   Follow-Up on: Tuesday, September 16th at 2:45 PM with Dr. Rolan.   Thank you for choosing  [facility].    At the [facility], you and your health needs are our priority. We have a designated team specialized in the treatment of Heart Failure. This Care Team includes your primary Heart Failure Specialized Cardiologist (physician), Advanced Practice Providers (APPs- Physician Assistants and Nurse Practitioners), and Pharmacist who all work together to provide you with the care you need, when you need it.   You may see any of the following providers on your designated Care Team at your next follow up:  Dr. Toribio Fuel Dr. Ezra Rolan Dr. Ria Commander Dr. Morene Brownie Ellouise Class, FNP Jaun Bash, RPH-CPP  Please be sure to bring in all your medications bottles to every appointment.   Need to Contact Us :  If you have any questions or concerns before your next appointment please send us  a message through [facility] or call our office at [phone].    TO LEAVE A MESSAGE FOR THE NURSE SELECT OPTION 2, PLEASE LEAVE A MESSAGE INCLUDING: YOUR NAME DATE OF BIRTH CALL BACK NUMBER REASON FOR CALL**this is important as we prioritize the call backs  YOU WILL RECEIVE A CALL BACK THE SAME DAY AS LONG AS YOU CALL BEFORE 4:00 PM

## 2024-01-19 ENCOUNTER — Other Ambulatory Visit: Payer: Self-pay

## 2024-01-19 ENCOUNTER — Ambulatory Visit

## 2024-01-19 VITALS — BP 114/71 | HR 73

## 2024-01-19 DIAGNOSIS — I10 Essential (primary) hypertension: Secondary | ICD-10-CM

## 2024-01-19 DIAGNOSIS — E109 Type 1 diabetes mellitus without complications: Secondary | ICD-10-CM | POA: Diagnosis not present

## 2024-01-19 DIAGNOSIS — I5020 Unspecified systolic (congestive) heart failure: Secondary | ICD-10-CM | POA: Diagnosis not present

## 2024-01-19 DIAGNOSIS — Z794 Long term (current) use of insulin: Secondary | ICD-10-CM

## 2024-01-19 LAB — GLUCOSE, POCT (MANUAL RESULT ENTRY)
POC Glucose: 49 mg/dL — AB (ref 70–99)
POC Glucose: 71 mg/dL (ref 70–99)

## 2024-01-19 MED ORDER — BLOOD GLUCOSE MONITOR SYSTEM W/DEVICE KIT
PACK | 0 refills | Status: AC
  Filled 2024-01-19 (×2): qty 1, 1d supply, fill #0
  Filled 2024-01-30: qty 1, 30d supply, fill #0

## 2024-01-19 MED ORDER — DEXCOM G7 SENSOR MISC
3 refills | Status: AC
  Filled 2024-01-19 – 2024-06-14 (×2): qty 3, 30d supply, fill #0

## 2024-01-19 MED ORDER — ACCU-CHEK SOFTCLIX LANCETS MISC
5 refills | Status: AC
  Filled 2024-01-19 (×2): qty 100, 17d supply, fill #0
  Filled 2024-01-30: qty 100, 16d supply, fill #0

## 2024-01-19 MED ORDER — BLOOD GLUCOSE TEST VI STRP
ORAL_STRIP | 5 refills | Status: AC
  Filled 2024-01-19: qty 100, 17d supply, fill #0
  Filled 2024-01-30: qty 100, 16d supply, fill #0

## 2024-01-19 MED ORDER — LANCET DEVICE MISC
1 refills | Status: AC
  Filled 2024-01-19: qty 1, fill #0

## 2024-01-19 NOTE — Patient Instructions (Addendum)
 Thanks for visiting with me today!  Insulin  to carbohydrate ratio (ICR): 1 unit for every 28 carbs Max Bolus: 6 units   Insulin  Sensitivity Factor (ISF): 1 units lowers blood sugar by 100 mg/dL Correct above blood sugar of 125 mg/dL  We will talk on the phone on 01/26/24 at 3pm  If you have any issues or question, please reach out to me! Roanne Haye E. Marsh, PharmD Clinical Pharmacist [facility] [phone]

## 2024-01-24 ENCOUNTER — Telehealth: Payer: Self-pay

## 2024-01-24 NOTE — Telephone Encounter (Signed)
   S:    Reason for call: Ashley Camacho is a 33 y.o. female with a history of diabetes (type 1), who called today regarding concern for hyperglycemia.  Care Team: Primary Care Provider: Ostwalt, [name], PA-C  Diabetes Medication Regimen  -Basal insulin : Lantus  Solostar pen (1 unit increments) 10 units daily -Bolus insulin : Novolog  Flexpen disposable pen (1 unit increments)  --ICR: 1:28 --ISF: 1:100 --Target BG: 125 mg/dL (day)  Patient reports that she believes her carb ratio may not be strong enough as she will notice her BG continues to elevate after eating. As a result, she will give herself additional correction doses after about 2-3 hours.  Dexcom Clarity CGM Report      Assessment TIR is 41%, not at goal > 70%. Concern for hypoglycemia from insulin  stacking. It appears that carb ratio is not strong enough. May also need to decrease sensitivity factor, however, difficult to assess d/t possible insulin  stacking. Will increase her carb ratio by ~11%.   Plan Continue Lantus  Solostar pen (1 unit increments) 10 units daily Novolog  Flexpen disposable pen (1 unit increments) dosing Increase ICR 1:28 to ICR 1:25 Continue ISF 1:100 with correct above 125 mg/dL; Max bolus: 6 units. Educated to avoid dosing more frequent than every 4 hours Continue wearing a Dexcom G7 continuous glucose monitor (CGM).  Patient verbalized understanding of treatment plan. Total time patient counseling 20 minutes.  Follow-up:  Pharmacist on 01/26/24 PCP clinic visit on 03/19/24  Ashley Camacho, PharmD Clinical Pharmacist [facility] [phone]

## 2024-01-25 ENCOUNTER — Telehealth: Payer: Self-pay

## 2024-01-25 NOTE — Progress Notes (Unsigned)
 S:     No chief complaint on file.   Reason for visit: ?  Ashley Camacho is a 33 y.o. female with a history of diabetes (type 1), who presents today for a follow up diabetes pharmacotherapy visit.? Pertinent PMH also includes HFrEF, HTN, MVR, CKD stage 4, anxiety, and depression.  Known DM Complications:  gastroparesis, nephropathy, peripheral neuropathy, and retinopathy    Care Team: Primary Care Provider: Ostwalt, [name], PA-C  Previously she was followed by Glenys Curlin, NP at [facility], last visit 09/02/21. Prior to that she was followed by Dr Arnulfo Kells at [facility]. Currently managed by Dr. Standley at [facility]. Last visit was 07/2023.   At that visit in 07/2023, CGM report demonstrated that typical hypoglycemic events were following hyperglycemia episodes, demonstrating overcorrection. She was instructed to administer Novolog  prior to eating. Per endocrinology, she previously used Tandem insulin  pump, but has been on MDI for several years. Insulin  administration has been complicated by gastroparesis and continuous nocturnal tube feeds. She is no longer administering nocturnal tube feeds. Novolog  ICR was 1:15  and ISF was 1:80 with correct above 110 mg/dL.   At last visit, ***.   Patient reports Diabetes was diagnosed at age 36   Diabetes Medication Regimen  Basal insulin : Lantus  Solostar pen (1 unit increments) 10 units daily Bolus insulin : Novolog  Flexpen disposable pen (1 unit increments)  ICR: 1:25 ISF: 1:100 Target BG: 125 mg/dL (day) Max bolus 6 units  Current hypertension medications include: valsartan  20 mg daily, metoprolol  succinate 25 mg daily, hydralazine  10 mg TID On hold: spironolactone  (CKD) and furosemide  (euvolemic) Current hyperlipidemia medications include: rosuvastatin  10 mg daily  Patient reports adherence to taking all medications as prescribed.  *** Patient denies adherence with medications, reports missing *** medications ***  times per week, on average.  Have you been experiencing any side effects to the medications prescribed? {YES NO:22349} Do you have any problems obtaining medications due to transportation or finances? {YES [id number] Insurance coverage: [place] Medicaid   Patient {Actions; denies-reports:120008} hypoglycemic events.  Reported home fasting blood sugars: ***  Reported 2 hour post-meal/random blood sugars: ***.  Patient {Actions; denies-reports:120008} nocturia (nighttime urination).  Patient {Actions; denies-reports:120008} neuropathy (nerve pain). Patient {Actions; denies-reports:120008} visual changes. Patient {Actions; denies-reports:120008} self foot exams.   Patient reported dietary habits: Eats 3 meals/day Breakfast (8-9am): eggs with toast  Lunch (1pm): sandwich, chicken nuggets Dinner (5-6p-biggest meal of the day): stir fry, protein, carb, and vegetable   Patient-reported exercise habits: walks around her neighborhood for 30 minutes a day  DM Prevention:  Statin: Taking; moderate intensity.?  History of chronic kidney disease? yes History of albuminuria? no, last UACR on 05/10/23 = 22 mg/g Last eye exam: Retinopathy Present Tobacco Use:   Tobacco Use: Low Risk  (01/18/2024)   Patient History    Smoking Tobacco Use: Never    Smokeless Tobacco Use: Never    Passive Exposure: Never   O:   Dexcom Clarity Report: ***  Vitals:  Wt Readings from Last 3 Encounters:  01/18/24 118 lb 3.2 oz (53.6 kg)  01/18/24 118 lb (53.5 kg)  01/07/24 125 lb (56.7 kg)   BP Readings from Last 3 Encounters:  01/19/24 114/71  01/18/24 (!) 142/72  01/18/24 (!) 151/83   Pulse Readings from Last 3 Encounters:  01/19/24 73  01/18/24 70  01/18/24 67     Labs:?  Lab Results  Component Value Date   HGBA1C 8.0 (H) 10/09/2023   HGBA1C 10.3 (  H) 05/10/2023   HGBA1C 9.4 (H) 11/02/2022   GLUCOSE 158 (H) 01/07/2024   MICRALBCREAT 22 05/10/2023   CREATININE 3.07 (H) 01/07/2024   CREATININE 3.15  (H) 01/06/2024   CREATININE 2.26 (H) 01/05/2024    Lab Results  Component Value Date   CHOL 171 12/26/2023   LDLCALC 84 12/26/2023   LDLCALC 49 05/10/2023   LDLCALC 69 12/21/2020   HDL 65 12/26/2023   TRIG 112 12/26/2023   TRIG 249 (H) 05/10/2023   TRIG 107 12/21/2020   ALT 16 01/03/2024   ALT 17 01/03/2024   AST 15 01/03/2024   AST 12 (L) 01/03/2024      Chemistry      Component Value Date/Time   NA 141 01/07/2024 0547   NA 145 (H) 12/21/2023 1438   NA 133 (L) 03/01/2013 1844   K 3.5 01/07/2024 0547   K 4.1 03/01/2013 1844   CL 93 (L) 01/07/2024 0547   CL 98 03/01/2013 1844   CO2 25 01/07/2024 0547   CO2 30 03/01/2013 1844   BUN 119 (H) 01/07/2024 0547   BUN 35 (H) 12/21/2023 1438   BUN 13 03/01/2013 1844   CREATININE 3.07 (H) 01/07/2024 0547   CREATININE 0.76 03/01/2013 1844      Component Value Date/Time   CALCIUM  9.0 01/07/2024 0547   CALCIUM  9.1 03/01/2013 1844   ALKPHOS 65 01/03/2024 1753   ALKPHOS 117 03/01/2013 1844   AST 15 01/03/2024 1753   AST 32 03/01/2013 1844   ALT 16 01/03/2024 1753   ALT 26 03/01/2013 1844   BILITOT 0.8 01/03/2024 1753   BILITOT 0.3 12/21/2023 1438   BILITOT 0.5 03/01/2013 1844       The ASCVD Risk score (Arnett DK, et al., 2019) failed to calculate for the following reasons:   The 2019 ASCVD risk score is only valid for ages 45 to 41   Risk score cannot be calculated because patient has a medical history suggesting prior/existing ASCVD  Lab Results  Component Value Date   MICRALBCREAT 22 05/10/2023    A/P: Diabetes currently *** with a most recent A1c of *** on ***, which is {DESC; UP/DOWN/UNCHANGED:18711} from *** on ***. Patient is *** able to verbalize appropriate hypoglycemia management plan. Medication adherence appears ***. Control is suboptimal due to ***. -{Meds adjust:18428} basal insulin  *** Lantus /Basaglar /Semglee  (insulin  glargine) *** Tresiba (insulin  degludec) from *** units to *** units daily in the  morning.  -{Meds adjust:18428} rapid insulin  *** Novolog  (insulin  aspart) *** Humalog (insulin  lispro) from *** to ***.  -{Meds adjust:18428} GLP-1 *** Trulicity (dulaglutide) *** Ozempic (semaglutide) *** Mounjaro (tirzepatide) from *** mg to *** mg .  -{Meds adjust:18428} SGLT2-I *** Farxiga (dapagliflozin) *** Jardiance (empagliflozin) 10 mg. Counseled on sick day rules. -{Meds adjust:18428} metformin ***.  -Patient educated on purpose, proper use, and potential adverse effects of ***.  -Extensively discussed pathophysiology of diabetes, recommended lifestyle interventions, dietary effects on blood sugar control.  -Counseled on s/sx of and management of hypoglycemia.  -Next A1c anticipated ***.   ASCVD risk - primary prevention in patient with diabetes. Last LDL is 84 mg/dL, not at goal of <29 mg/dL.  -Continued rosuvastatin  10 mg daily.   Hypertension longstanding *** currently ***. Blood pressure goal of <130/80 *** mmHg. Medication adherence ***. Blood pressure control is suboptimal due to ***. -{Meds adjust:18428} *** mg ***.  {pharmacisttime:33368}  Follow-up:  Pharmacist on *** PCP clinic visit on 03/19/24  [name], PharmD Clinical Pharmacist [facility] [phone]

## 2024-01-25 NOTE — Telephone Encounter (Signed)
 Error, per provider, endo is already handling PA for dexcom G7 sensors

## 2024-01-26 ENCOUNTER — Other Ambulatory Visit (INDEPENDENT_AMBULATORY_CARE_PROVIDER_SITE_OTHER)

## 2024-01-26 DIAGNOSIS — E109 Type 1 diabetes mellitus without complications: Secondary | ICD-10-CM

## 2024-01-26 DIAGNOSIS — Z794 Long term (current) use of insulin: Secondary | ICD-10-CM

## 2024-01-27 ENCOUNTER — Other Ambulatory Visit
Admission: RE | Admit: 2024-01-27 | Discharge: 2024-01-27 | Disposition: A | Discharge status: Home / Self Care | Attending: Physician Assistant | Admitting: Physician Assistant

## 2024-01-27 ENCOUNTER — Other Ambulatory Visit: Payer: Self-pay

## 2024-01-27 ENCOUNTER — Telehealth: Payer: Self-pay

## 2024-01-27 ENCOUNTER — Other Ambulatory Visit
Admission: RE | Admit: 2024-01-27 | Discharge: 2024-01-27 | Disposition: A | Discharge status: Home / Self Care | Source: Ambulatory Visit | Attending: Family | Admitting: Family

## 2024-01-27 ENCOUNTER — Encounter (INDEPENDENT_AMBULATORY_CARE_PROVIDER_SITE_OTHER): Payer: Self-pay | Admitting: Vascular Surgery

## 2024-01-27 ENCOUNTER — Ambulatory Visit (INDEPENDENT_AMBULATORY_CARE_PROVIDER_SITE_OTHER): Admitting: Vascular Surgery

## 2024-01-27 VITALS — BP 147/78 | HR 79 | Ht 61.0 in | Wt 119.8 lb

## 2024-01-27 DIAGNOSIS — I5042 Chronic combined systolic (congestive) and diastolic (congestive) heart failure: Secondary | ICD-10-CM

## 2024-01-27 DIAGNOSIS — M7989 Other specified soft tissue disorders: Secondary | ICD-10-CM

## 2024-01-27 DIAGNOSIS — E109 Type 1 diabetes mellitus without complications: Secondary | ICD-10-CM | POA: Diagnosis not present

## 2024-01-27 DIAGNOSIS — I10 Essential (primary) hypertension: Secondary | ICD-10-CM | POA: Diagnosis not present

## 2024-01-27 LAB — TSH: TSH: 1.435 u[IU]/mL (ref 0.350–4.500)

## 2024-01-27 LAB — T4, FREE: Free T4: 1.02 ng/dL (ref 0.61–1.12)

## 2024-01-27 LAB — BASIC METABOLIC PANEL WITH GFR
Anion gap: 7 (ref 5–15)
BUN: 56 mg/dL — ABNORMAL HIGH (ref 6–20)
CO2: 19 mmol/L — ABNORMAL LOW (ref 22–32)
Calcium: 8.5 mg/dL — ABNORMAL LOW (ref 8.9–10.3)
Chloride: 115 mmol/L — ABNORMAL HIGH (ref 98–111)
Creatinine, Ser: 1.86 mg/dL — ABNORMAL HIGH (ref 0.44–1.00)
GFR, Estimated: 36 mL/min — ABNORMAL LOW (ref >60)
Glucose, Bld: 205 mg/dL — ABNORMAL HIGH (ref 70–99)
Potassium: 5.3 mmol/L — ABNORMAL HIGH (ref 3.5–5.1)
Sodium: 141 mmol/L (ref 135–145)

## 2024-01-27 LAB — BRAIN NATRIURETIC PEPTIDE: B Natriuretic Peptide: 1694.6 pg/mL — ABNORMAL HIGH (ref 0.0–100.0)

## 2024-01-27 NOTE — Telephone Encounter (Signed)
 Order placed for add on lab per [place], NP

## 2024-01-27 NOTE — Progress Notes (Signed)
 Subjective:    Patient ID: Ashley Camacho, female    DOB: 01-Sep-1990, 33 y.o.   MRN: [id number] Chief Complaint  Patient presents with   New Patient (Initial Visit)    Having some swelling in her legs while she was in the hospital she stated that it has gotten better     Ashley Camacho is a 33 yo female who presents to clinic today with chief complaint of bilateral lower extremity swelling that developed while she was in the hospital last month.  She has a known history of heart failure with hypertension and now with lower extremity edema.  She is also a type I diabetic on insulin .  Today she presents with no swelling in her bilateral lower extremities if any it is very very little.  Post discharge follow-up with her PCP they resumed her metoprolol  and started some valsartan  per cardiology's recommendation.  They also continue her hydralazine  and Imdur  to help with elevated blood pressures.  Since that time patient reports her legs have gotten better.  She denies have any difficulties with her daily ADLs.  She has no open sores wounds or ulcers to her lower extremities.  She denies any pain to her bilateral lower extremities while at rest or while ambulating.     Review of Systems  Constitutional: Negative.   Cardiovascular:  Positive for leg swelling.  Musculoskeletal:  Positive for myalgias.  Skin:  Positive for color change.  All other systems reviewed and are negative.      Objective:   Physical Exam Vitals reviewed.  Constitutional:      Appearance: Normal appearance. She is normal weight.  HENT:     Head: Normocephalic.  Eyes:     Pupils: Pupils are equal, round, and reactive to light.  Cardiovascular:     Rate and Rhythm: Normal rate and regular rhythm.     Pulses: Normal pulses.     Heart sounds: Normal heart sounds.  Pulmonary:     Effort: Pulmonary effort is normal.     Breath sounds: Normal breath sounds.  Abdominal:     General: Abdomen is flat. Bowel sounds are  normal.     Palpations: Abdomen is soft.  Musculoskeletal:        General: Normal range of motion.  Skin:    General: Skin is warm and dry.     Capillary Refill: Capillary refill takes 2 to 3 seconds.  Neurological:     General: No focal deficit present.     Mental Status: She is alert and oriented to person, place, and time. Mental status is at baseline.  Psychiatric:        Mood and Affect: Mood normal.        Behavior: Behavior normal.        Thought Content: Thought content normal.        Judgment: Judgment normal.     BP (!) 147/78   Pulse 79   Ht 5' 1 (1.549 m)   Wt 119 lb 12.8 oz (54.3 kg)   BMI 22.64 kg/m   Past Medical History:  Diagnosis Date   Acute metabolic encephalopathy 06/10/2022   Adrenal insufficiency (HCC)    Anemia    Anxiety    Blood transfusion without reported diagnosis    CHF (congestive heart failure) (HCC)    Chronic kidney disease    Depression    Gastroparesis    Hypertension    Hypotension    Intractable nausea and vomiting 11/13/2022  Migraine    Thyroid  disease    Type 1 diabetes (HCC)     Social History   Socioeconomic History   Marital status: Single    Spouse name: Not on file   Number of children: Not on file   Years of education: Not on file   Highest education level: 9th grade  Occupational History   Occupation: home maker  Tobacco Use   Smoking status: Never    Passive exposure: Never   Smokeless tobacco: Never  Vaping Use   Vaping status: Never Used  Substance and Sexual Activity   Alcohol use: No   Drug use: No   Sexual activity: Not Currently    Birth control/protection: None  Other Topics Concern   Not on file  Social History Narrative   Not on file   Social Drivers of Health   Financial Resource Strain: Medium Risk (01/18/2024)   Overall Financial Resource Strain (CARDIA)    Difficulty of Paying Living Expenses: Somewhat hard  Food Insecurity: Food Insecurity Present (01/18/2024)   Hunger Vital Sign     Worried About Running Out of Food in the Last Year: Sometimes true    Ran Out of Food in the Last Year: Sometimes true  Transportation Needs: No Transportation Needs (01/18/2024)   PRAPARE - Administrator, Civil Service (Medical): No    Lack of Transportation (Non-Medical): No  Recent Concern: Transportation Needs - Unmet Transportation Needs (10/31/2023)   PRAPARE - Administrator, Civil Service (Medical): Yes    Lack of Transportation (Non-Medical): No  Physical Activity: Insufficiently Active (01/18/2024)   Exercise Vital Sign    Days of Exercise per Week: 3 days    Minutes of Exercise per Session: 30 min  Stress: Stress Concern Present (01/18/2024)   [employer] of Occupational Health - Occupational Stress Questionnaire    Feeling of Stress: Rather much  Social Connections: Socially Isolated (01/18/2024)   Social Connection and Isolation Panel    Frequency of Communication with Friends and Family: More than three times a week    Frequency of Social Gatherings with Friends and Family: More than three times a week    Attends Religious Services: Never    Database administrator or Organizations: No    Attends Engineer, structural: Not on file    Marital Status: Never married  Intimate Partner Violence: Not At Risk (01/03/2024)   Humiliation, Afraid, Rape, and Kick questionnaire    Fear of Current or Ex-Partner: No    Emotionally Abused: No    Physically Abused: No    Sexually Abused: No    Past Surgical History:  Procedure Laterality Date   BIOPSY  01/14/2021   Procedure: BIOPSY;  Surgeon: Eda Iha, MD;  Location: [facility] ENDOSCOPY;  Service: Gastroenterology;;   CENTRAL LINE INSERTION N/A 10/10/2023   Procedure: CENTRAL LINE INSERTION;  Surgeon: Marea Selinda RAMAN, MD;  Location: [facility] INVASIVE CV LAB;  Service: Cardiovascular;  Laterality: N/A;   CESAREAN SECTION     x2   CESAREAN SECTION WITH BILATERAL TUBAL LIGATION N/A 02/17/2021    Procedure: CESAREAN SECTION WITH BILATERAL TUBAL LIGATION;  Surgeon: Barbra Lang PARAS, DO;  Location: [facility] LD ORS;  Service: Obstetrics;  Laterality: N/A;   ESOPHAGOGASTRODUODENOSCOPY (EGD) WITH PROPOFOL  N/A 01/14/2021   Procedure: ESOPHAGOGASTRODUODENOSCOPY (EGD) WITH PROPOFOL ;  Surgeon: Eda Iha, MD;  Location: [facility] ENDOSCOPY;  Service: Gastroenterology;  Laterality: N/A;   IR BONE MARROW BIOPSY & ASPIRATION  12/16/2022  IR REPLC DUODEN/JEJUNO TUBE PERCUT W/FLUORO  11/22/2022   JEJUNOSTOMY N/A 06/13/2022   Procedure: JEJUNOSTOMY;  Surgeon: Jordis Laneta FALCON, MD;  Location: [facility] ORS;  Service: General;  Laterality: N/A;   MOUTH SURGERY     TEE WITHOUT CARDIOVERSION N/A 01/06/2024   Procedure: ECHOCARDIOGRAM, TRANSESOPHAGEAL;  Surgeon: Rolan Ezra RAMAN, MD;  Location: [facility] ORS;  Service: Cardiovascular;  Laterality: N/A;   TUBAL LIGATION      Family History  Problem Relation Age of Onset   Breast cancer Mother    Seizures Mother    Cancer Mother    Diabetes type I Father    CAD Father    Diabetes Father    Early death Father    Lung cancer Maternal Grandfather    CAD Paternal Grandmother    Diabetes Paternal Grandmother    CAD Paternal Grandfather    Diabetes Paternal Grandfather    ADD / ADHD Daughter    Diabetes Maternal Uncle    Ovarian cancer Neg Hx     No Known Allergies     Latest Ref Rng & Units 01/07/2024    5:47 AM 01/06/2024    3:50 AM 01/03/2024    6:50 AM  CBC  WBC 4.0 - 10.5 K/uL 7.0  9.4  7.2   Hemoglobin 12.0 - 15.0 g/dL 88.7  89.3  89.3   Hematocrit 36.0 - 46.0 % 34.0  32.3  32.1   Platelets 150 - 400 K/uL 164  165  134       CMP     Component Value Date/Time   NA 141 01/07/2024 0547   NA 145 (H) 12/21/2023 1438   NA 133 (L) 03/01/2013 1844   K 3.5 01/07/2024 0547   K 4.1 03/01/2013 1844   CL 93 (L) 01/07/2024 0547   CL 98 03/01/2013 1844   CO2 25 01/07/2024 0547   CO2 30 03/01/2013 1844   GLUCOSE 158 (H) 01/07/2024 0547   GLUCOSE 91  01/10/2021 0610   BUN 119 (H) 01/07/2024 0547   BUN 35 (H) 12/21/2023 1438   BUN 13 03/01/2013 1844   CREATININE 3.07 (H) 01/07/2024 0547   CREATININE 0.76 03/01/2013 1844   CALCIUM  9.0 01/07/2024 0547   CALCIUM  9.1 03/01/2013 1844   PROT 6.0 (L) 01/03/2024 1753   PROT 5.0 (L) 12/21/2023 1438   PROT 7.5 03/01/2013 1844   ALBUMIN  3.5 01/03/2024 1753   ALBUMIN  3.3 (L) 12/21/2023 1438   ALBUMIN  3.9 03/01/2013 1844   AST 15 01/03/2024 1753   AST 32 03/01/2013 1844   ALT 16 01/03/2024 1753   ALT 26 03/01/2013 1844   ALKPHOS 65 01/03/2024 1753   ALKPHOS 117 03/01/2013 1844   BILITOT 0.8 01/03/2024 1753   BILITOT 0.3 12/21/2023 1438   BILITOT 0.5 03/01/2013 1844   EGFR 39 (L) 12/21/2023 1438   GFRNONAA 20 (L) 01/07/2024 0547   GFRNONAA >60 03/01/2013 1844     No results found.     Assessment & Plan:   1. Leg swelling (Primary) Recommend:  I have had a long discussion with the patient regarding swelling and why it  causes symptoms.  Patient will begin wearing graduated compression on a daily basis a prescription was given. The patient will  wear the stockings first thing in the morning and removing them in the evening. The patient is instructed specifically not to sleep in the stockings.   In addition, behavioral modification will be initiated.  This will include frequent elevation, use of over  the counter pain medications and exercise such as walking.   The patient will follow-up with me as needed now that medication changes for her heart failure and control of her diabetes have resulted in resolution of her bilateral lower extremity edema.   2. Essential hypertension Continue antihypertensive medications as already ordered, these medications have been reviewed and there are no changes at this time.  3. Type 1 diabetes mellitus without complication (HCC) Continue hypoglycemic medications as already ordered, these medications have been reviewed and there are no changes at this  time.  Hgb A1C to be monitored as already arranged by primary service   Current Outpatient Medications on File Prior to Visit  Medication Sig Dispense Refill   Accu-Chek Softclix Lancets lancets Use to check blood sugar up to 6 times daily. May substitute to any manufacturer covered by patient's insurance. 100 each 5   acetaminophen  (TYLENOL ) 500 MG tablet Take 2 tablets (1,000 mg total) by mouth every 6 (six) hours as needed for headache, fever or moderate pain. 30 tablet 3   BAQSIMI ONE PACK 3 MG/DOSE POWD Place 3 mg into the nose as needed.     BD PEN NEEDLE MINI ULTRAFINE 31G X 5 MM MISC by Other route.     Blood Glucose Monitoring Suppl (BLOOD GLUCOSE MONITOR SYSTEM) w/Device KIT Use to check blood sugar up to 6 times daily. May substitute to any manufacturer covered by patient's insurance. 1 kit 0   Continuous Glucose Sensor (DEXCOM G7 SENSOR) MISC Use to check blood sugar. Change every 10 days 3 each 3   divalproex  (DEPAKOTE ) 250 MG DR tablet Take 250 mg by mouth in the morning.     [Paused] furosemide  (LASIX ) 20 MG tablet Take 2 tablets (40 mg total) by mouth daily. 60 tablet 5   Glucose Blood (BLOOD GLUCOSE TEST STRIPS) STRP Use to check blood sugar up to 6 times daily. May substitute to any manufacturer covered by patient's insurance. 100 strip 5   hydrALAZINE  (APRESOLINE ) 10 MG tablet Take 1 tablet (10 mg total) by mouth with breakfast, with lunch, and with evening meal. 90 tablet 0   hydrocortisone  (CORTEF ) 10 MG tablet Take 2 tablets (20 mg total) by mouth daily with breakfast AND 1 tablet (10 mg total) daily with supper. 90 tablet 0   insulin  aspart (NOVOLOG ) 100 UNIT/ML FlexPen Inject 2 Units into the skin 3 (three) times daily with meals. If eating and Blood Glucose (BG) 80 or higher inject 2 units for meal coverage and add correction dose per scale. If not eating, correction dose only. BG <150= 0 unit; BG 150-200= 1 unit; BG 201-250= 3 unit; BG 251-300= 5 unit; BG 301-350= 7 unit;  BG 351-400= 9 unit; BG >400= 11 unit and Call Primary Care.     insulin  glargine (LANTUS ) 100 UNIT/ML Solostar Pen Inject 10 Units into the skin daily.     Insulin  Pen Needle (PEN NEEDLES) 31G X 6 MM MISC PEN NEEDLES 31G X 6 MM     isosorbide  mononitrate (IMDUR ) 30 MG 24 hr tablet Take 0.5 tablets (15 mg total) by mouth daily. 30 tablet 0   Lancet Device MISC Use to check blood sugar up to 6 times daily. May substitute to any manufacturer covered by patient's insurance. 1 each 1   levothyroxine  (SYNTHROID ) 75 MCG tablet TAKE 1 TABLET (75 MCG TOTAL) BY MOUTH DAILY AT 6 (SIX) AM. 90 tablet 0   metoprolol  succinate (TOPROL -XL) 25 MG 24 hr tablet Take 1 tablet (  25 mg total) by mouth daily. 30 tablet 0   Pancrelipase , Lip-Prot-Amyl, 24000-76000 units CPEP Take 1 capsule (24,000 Units total) by mouth 3 (three) times daily before meals. 270 capsule 0   rosuvastatin  (CRESTOR ) 10 MG tablet Take 1 tablet (10 mg total) by mouth daily. 30 tablet 11   [Paused] spironolactone  (ALDACTONE ) 25 MG tablet Take 0.5 tablets (12.5 mg total) by mouth daily. 45 tablet 3   valsartan  (DIOVAN ) 40 MG tablet Take 0.5 tablets (20 mg total) by mouth daily. 45 tablet 3   No current facility-administered medications on file prior to visit.    There are no Patient Instructions on file for this visit. No follow-ups on file.   Gwendlyn JONELLE Shank, NP

## 2024-01-27 NOTE — Progress Notes (Deleted)
 S:     No chief complaint on file.   Reason for visit: ?  Ashley Camacho is a 33 y.o. female with a history of diabetes (type 1), who presents today for a follow up diabetes pharmacotherapy visit.? Pertinent PMH also includes HFrEF, HTN, MVR, CKD stage 4, anxiety, and depression.  Known DM Complications:  gastroparesis, nephropathy, peripheral neuropathy, and retinopathy    Care Team: Primary Care Provider: Ostwalt, [name], PA-C  Previously she was followed by Glenys Curlin, NP at [facility], last visit 09/02/21. Prior to that she was followed by Dr Arnulfo Kells at [facility]. Currently managed by Dr. Standley at [facility]. Last visit was 07/2023.   At that visit in 07/2023, CGM report demonstrated that typical hypoglycemic events were following hyperglycemia episodes, demonstrating overcorrection. She was instructed to administer Novolog  prior to eating. Per endocrinology, she previously used Tandem insulin  pump, but has been on MDI for several years. Insulin  administration has been complicated by gastroparesis and continuous nocturnal tube feeds. She is no longer administering nocturnal tube feeds. Novolog  ICR was 1:15  and ISF was 1:80 with correct above 110 mg/dL.   At last visit with clinical pharmacist on 01/27/24, patient reported she has been forgetting to give her mealtime insulin  dose prior to meals. As a result, she had been using her midmeal blood glucose for her calculation for ISF and with subsequent hypoglycemia. Additionally, ICR was adjusted from 1:25 to 1:22.  Patient reports Diabetes was diagnosed at age 62   Diabetes Medication Regimen  Basal insulin : Lantus  Solostar pen (1 unit increments) 10 units daily Bolus insulin : Novolog  Flexpen disposable pen (1 unit increments)  ICR: 1:22 ISF: 1:100 Target BG: 125 mg/dL (day) Max bolus 6 units  Current hypertension medications include: valsartan  20 mg daily, metoprolol  succinate 25 mg daily, hydralazine  10 mg  TID On hold: spironolactone  (CKD) and furosemide  (euvolemic) Current hyperlipidemia medications include: rosuvastatin  10 mg daily  Patient reports adherence to taking all medications as prescribed.   Insurance coverage: [place] Medicaid   Patient reports hypoglycemic events.  Patient reported dietary habits: Eats 3 meals/day Breakfast (8-9am): eggs with toast  Lunch (1pm): sandwich, chicken nuggets Dinner (5-6p-biggest meal of the day): stir fry, protein, carb, and vegetable   Patient-reported exercise habits: walks around her neighborhood for 30 minutes a day  DM Prevention:  Statin: Taking; moderate intensity.?  History of chronic kidney disease? yes History of albuminuria? no, last UACR on 05/10/23 = 22 mg/g Last eye exam: Retinopathy Present Tobacco Use:   Tobacco Use: Low Risk  (01/27/2024)   Patient History    Smoking Tobacco Use: Never    Smokeless Tobacco Use: Never    Passive Exposure: Never   O:   Dexcom Clarity Report:  ***   Vitals:  Wt Readings from Last 3 Encounters:  01/27/24 119 lb 12.8 oz (54.3 kg)  01/18/24 118 lb 3.2 oz (53.6 kg)  01/18/24 118 lb (53.5 kg)   BP Readings from Last 3 Encounters:  01/27/24 (!) 147/78  01/19/24 114/71  01/18/24 (!) 142/72   Pulse Readings from Last 3 Encounters:  01/27/24 79  01/19/24 73  01/18/24 70     Labs:?  Lab Results  Component Value Date   HGBA1C 8.0 (H) 10/09/2023   HGBA1C 10.3 (H) 05/10/2023   HGBA1C 9.4 (H) 11/02/2022   GLUCOSE 158 (H) 01/07/2024   MICRALBCREAT 22 05/10/2023   CREATININE 3.07 (H) 01/07/2024   CREATININE 3.15 (H) 01/06/2024   CREATININE 2.26 (  H) 01/05/2024    Lab Results  Component Value Date   CHOL 171 12/26/2023   LDLCALC 84 12/26/2023   LDLCALC 49 05/10/2023   LDLCALC 69 12/21/2020   HDL 65 12/26/2023   TRIG 112 12/26/2023   TRIG 249 (H) 05/10/2023   TRIG 107 12/21/2020   ALT 16 01/03/2024   ALT 17 01/03/2024   AST 15 01/03/2024   AST 12 (L) 01/03/2024       Chemistry      Component Value Date/Time   NA 141 01/07/2024 0547   NA 145 (H) 12/21/2023 1438   NA 133 (L) 03/01/2013 1844   K 3.5 01/07/2024 0547   K 4.1 03/01/2013 1844   CL 93 (L) 01/07/2024 0547   CL 98 03/01/2013 1844   CO2 25 01/07/2024 0547   CO2 30 03/01/2013 1844   BUN 119 (H) 01/07/2024 0547   BUN 35 (H) 12/21/2023 1438   BUN 13 03/01/2013 1844   CREATININE 3.07 (H) 01/07/2024 0547   CREATININE 0.76 03/01/2013 1844      Component Value Date/Time   CALCIUM  9.0 01/07/2024 0547   CALCIUM  9.1 03/01/2013 1844   ALKPHOS 65 01/03/2024 1753   ALKPHOS 117 03/01/2013 1844   AST 15 01/03/2024 1753   AST 32 03/01/2013 1844   ALT 16 01/03/2024 1753   ALT 26 03/01/2013 1844   BILITOT 0.8 01/03/2024 1753   BILITOT 0.3 12/21/2023 1438   BILITOT 0.5 03/01/2013 1844       The ASCVD Risk score (Arnett DK, et al., 2019) failed to calculate for the following reasons:   The 2019 ASCVD risk score is only valid for ages 47 to 43   Risk score cannot be calculated because patient has a medical history suggesting prior/existing ASCVD  Lab Results  Component Value Date   MICRALBCREAT 22 05/10/2023    A/P: TIR is ***%, not at goal > 70%. ***   Continue Lantus  Solostar pen (1 unit increments) 10 units daily Novolog  Flexpen disposable pen (1 unit increments) dosing Increase ICR 1:25 to ICR 1:22*** Continue ISF 1:100 with correct above 125 mg/dL; Max bolus: 6 units.  -Extensively discussed pathophysiology of diabetes, recommended lifestyle interventions, dietary effects on blood sugar control.  -Counseled on s/sx of and management of hypoglycemia. Educated on alternate low snacks -Next A1c anticipated 04/2024.   ASCVD risk - primary prevention in patient with diabetes. Last LDL is 84 mg/dL, not at goal of <29 mg/dL.  -Continued rosuvastatin  10 mg daily.   Patient verbalized understanding of treatment plan. Total time patient counseling *** minutes.  Follow-up:  Pharmacist on  01/30/24 PCP clinic visit on 03/19/24  [name], PharmD Clinical Pharmacist [facility] [phone]

## 2024-01-30 ENCOUNTER — Other Ambulatory Visit: Payer: Self-pay

## 2024-01-30 ENCOUNTER — Other Ambulatory Visit

## 2024-01-30 ENCOUNTER — Ambulatory Visit: Payer: Self-pay | Admitting: Physician Assistant

## 2024-01-30 ENCOUNTER — Ambulatory Visit: Payer: Self-pay | Admitting: Family

## 2024-01-30 NOTE — Progress Notes (Signed)
 S:     Chief Complaint  Patient presents with   Diabetes    Reason for visit: ?  Ashley Camacho is a 33 y.o. female with a history of diabetes (type 1), who presents today for a follow up diabetes pharmacotherapy visit.? Pertinent PMH also includes HFrEF, HTN, MVR, CKD stage 4, anxiety, and depression.  Known DM Complications:  gastroparesis, nephropathy, peripheral neuropathy, and retinopathy    Care Team: Primary Care Provider: Ostwalt, [name], PA-C  Previously she was followed by Glenys Curlin, NP at [facility] Endocrinology, last visit 09/02/21. Prior to that she was followed by Dr Arnulfo Kells at [facility] Endocrinology. Currently managed by Dr. Standley at [facility] Endocrinology. Last visit was 07/2023.   At that visit in 07/2023, CGM report demonstrated that typical hypoglycemic events were following hyperglycemia episodes, demonstrating overcorrection. She was instructed to administer Novolog  prior to eating. Per endocrinology, she previously used Tandem insulin  pump, but has been on MDI for several years. Insulin  administration has been complicated by gastroparesis and continuous nocturnal tube feeds. She is no longer administering nocturnal tube feeds. Novolog  ICR was 1:15 and ISF was 1:80 with correct above 110 mg/dL.   At last visit with clinical pharmacist on 01/26/24, patient reported she has been forgetting to give her mealtime insulin  dose prior to meals. As a result, she had been using her midmeal blood glucose for her calculation for ISF and with subsequent hypoglycemia. Additionally, ICR was adjusted from 1:25 to 1:22.  At last visit with cardiology on 01/27/24, BNP remained elevated, but improved and K was slightly elevated. Kidney function had improved at that time as well. Patient was instructed to restart furosemide  20 mg daily.  Patient reports Diabetes was diagnosed at age 33   Diabetes Medication Regimen  Basal insulin : Lantus  Solostar pen (1 unit increments) 10 units daily Bolus insulin :  Novolog  Flexpen disposable pen (1 unit increments)  ICR: 1:22 ISF: 1:100 Target BG: 125 mg/dL (day) Max bolus 6 units  Current hypertension medications include: valsartan  20 mg daily, metoprolol  succinate 25 mg daily, hydralazine  10 mg TID, furosemide  20 mg daily On hold: spironolactone  (CKD)  Current hyperlipidemia medications include: rosuvastatin  10 mg daily  Patient reports adherence to taking all medications as prescribed.   Insurance coverage:  Medicaid   Patient reports hypoglycemic events.  Patient reported dietary habits: Eats 3 meals/day Breakfast (8-9am): eggs with toast  Lunch (1pm): sandwich, chicken nuggets Dinner (5-6p-biggest meal of the day): stir fry, protein, carb, and vegetable   Patient-reported exercise habits: walks around her neighborhood for 30 minutes a day  DM Prevention:  Statin: Taking; moderate intensity.?  History of chronic kidney disease? yes History of albuminuria? no, last UACR on 05/10/23 = 22 mg/g Last eye exam: Retinopathy Present Tobacco Use:   Tobacco Use: Low Risk  (01/27/2024)   Patient History    Smoking Tobacco Use: Never    Smokeless Tobacco Use: Never    Passive Exposure: Never   O:   Dexcom Clarity Report:       *reports sensor stopped working and was replaced on Sunday     Vitals:  Wt Readings from Last 3 Encounters:  01/27/24 119 lb 12.8 oz (54.3 kg)  01/18/24 118 lb 3.2 oz (53.6 kg)  01/18/24 118 lb (53.5 kg)   BP Readings from Last 3 Encounters:  01/27/24 (!) 147/78  01/19/24 114/71  01/18/24 (!) 142/72   Pulse Readings from Last 3 Encounters:  01/27/24 79  01/19/24 73  01/18/24 70  Labs:?  Lab Results  Component Value Date   HGBA1C 8.0 (H) 10/09/2023   HGBA1C 10.3 (H) 05/10/2023   HGBA1C 9.4 (H) 11/02/2022   GLUCOSE 205 (H) 01/27/2024   MICRALBCREAT 22 05/10/2023   CREATININE 1.86 (H) 01/27/2024   CREATININE 3.07 (H) 01/07/2024   CREATININE 3.15 (H) 01/06/2024    Lab Results   Component Value Date   CHOL 171 12/26/2023   LDLCALC 84 12/26/2023   LDLCALC 49 05/10/2023   LDLCALC 69 12/21/2020   HDL 65 12/26/2023   TRIG 112 12/26/2023   TRIG 249 (H) 05/10/2023   TRIG 107 12/21/2020   ALT 16 01/03/2024   ALT 17 01/03/2024   AST 15 01/03/2024   AST 12 (L) 01/03/2024      Chemistry      Component Value Date/Time   NA 141 01/27/2024 1344   NA 145 (H) 12/21/2023 1438   NA 133 (L) 03/01/2013 1844   K 5.3 (H) 01/27/2024 1344   K 4.1 03/01/2013 1844   CL 115 (H) 01/27/2024 1344   CL 98 03/01/2013 1844   CO2 19 (L) 01/27/2024 1344   CO2 30 03/01/2013 1844   BUN 56 (H) 01/27/2024 1344   BUN 35 (H) 12/21/2023 1438   BUN 13 03/01/2013 1844   CREATININE 1.86 (H) 01/27/2024 1344   CREATININE 0.76 03/01/2013 1844      Component Value Date/Time   CALCIUM  8.5 (L) 01/27/2024 1344   CALCIUM  9.1 03/01/2013 1844   ALKPHOS 65 01/03/2024 1753   ALKPHOS 117 03/01/2013 1844   AST 15 01/03/2024 1753   AST 32 03/01/2013 1844   ALT 16 01/03/2024 1753   ALT 26 03/01/2013 1844   BILITOT 0.8 01/03/2024 1753   BILITOT 0.3 12/21/2023 1438   BILITOT 0.5 03/01/2013 1844       The ASCVD Risk score (Arnett DK, et al., 2019) failed to calculate for the following reasons:   The 2019 ASCVD risk score is only valid for ages 22 to 65   Risk score cannot be calculated because patient has a medical history suggesting prior/existing ASCVD  Lab Results  Component Value Date   MICRALBCREAT 22 05/10/2023    A/P: TIR is 30%, not at goal > 70%. Per CGM report, patient was consistently hypoglycemic this previous weekend. Patient endorses a migraine that started midday Saturday and has not yet fully resolved. As a result, she admits to not taking her insulin  as instructed this past weekend. Of note, when she administered her dose of insulin  around lunch time today, her BG was in range at that time, not necessitating a correction dose. She reported her BG was on the rise with a  slanted arrow (184 mg/dL) during the call about 1 hour after eating. Because of this, will continue to adjust her carb ration by ~10%. She endorses significant difficulty with maintaining a routine lately with her meals and insulin  injections. Patient set a goal to attempt to maintain a routine with breakfast and mealtime insulin  over the next week.    -Continue Lantus  Solostar pen (1 unit increments) 10 units daily -Novolog  Flexpen disposable pen (1 unit increments) dosing Increase ICR 1:22 to ICR 1:20 Continue ISF 1:100 with correct above 125 mg/dL; Max bolus: 6 units.  -Extensively discussed pathophysiology of diabetes, recommended lifestyle interventions, dietary effects on blood sugar control.  -Counseled on s/sx of and management of hypoglycemia. Educated on alternate low snacks -Next A1c anticipated 04/2024.   ASCVD risk - primary prevention in patient  with diabetes. Last LDL is 84 mg/dL, not at goal of <29 mg/dL.  -Continued rosuvastatin  10 mg daily.   Patient verbalized understanding of treatment plan. Total time patient counseling 25 minutes.  Follow-up:  Pharmacist on 02/01/24 PCP clinic visit on 03/19/24  [name], PharmD Clinical Pharmacist [facility] [phone]

## 2024-02-01 ENCOUNTER — Other Ambulatory Visit

## 2024-02-01 NOTE — Progress Notes (Deleted)
 S:     No chief complaint on file.   Reason for visit: ?  Ashley Camacho is a 33 y.o. female with a history of diabetes (type 1), who presents today for a follow up diabetes pharmacotherapy visit.? Pertinent PMH also includes HFrEF, HTN, MVR, CKD stage 4, anxiety, and depression.  Known DM Complications:  gastroparesis, nephropathy, peripheral neuropathy, and retinopathy    Care Team: Primary Care Provider: Ostwalt, [name], PA-C  Previously she was followed by Glenys Curlin, NP at [facility], last visit 09/02/21. Prior to that she was followed by Dr Arnulfo Kells at [facility]. Currently managed by Dr. Standley at [facility]. Last visit was 07/2023.   At that visit in 07/2023, CGM report demonstrated that typical hypoglycemic events were following hyperglycemia episodes, demonstrating overcorrection. She was instructed to administer Novolog  prior to eating. Per endocrinology, she previously used Tandem insulin  pump, but has been on MDI for several years. Insulin  administration has been complicated by gastroparesis and continuous nocturnal tube feeds. She is no longer administering nocturnal tube feeds. Novolog  ICR was 1:15  and ISF was 1:80 with correct above 110 mg/dL.   At last visit with cardiology on 01/27/24, BNP remained elevated, but improved and K was slightly elevated. Kidney function had improved at that time as well. Patient was instructed to restart furosemide  20 mg daily.   At last visit with clinical pharmacist on 01/30/24, she endorsed a migraine over the weekend leading to inconsistent insulin  administration and subsequent hyperglycemia. Carb ratio was adjusted from 1:22 to 1:20 due to rising BG at the time of the call following her meal. Patient set a goal for maintaining a routine with her breakfast and mealtime insulin .  Patient reports Diabetes was diagnosed at age 5   Diabetes Medication Regimen  Basal insulin : Lantus  Solostar pen (1 unit increments) 10 units  daily Bolus insulin : Novolog  Flexpen disposable pen (1 unit increments)  ICR: 1:20 ISF: 1:100 Target BG: 125 mg/dL (day) Max bolus 6 units  Current hypertension medications include: valsartan  20 mg daily, metoprolol  succinate 25 mg daily, hydralazine  10 mg TID On hold: spironolactone  (CKD) and furosemide  (euvolemic) Current hyperlipidemia medications include: rosuvastatin  10 mg daily  Patient reports adherence to taking all medications as prescribed.   Insurance coverage: [place] Medicaid   Patient reports hypoglycemic events.  Patient reported dietary habits: Eats 3 meals/day Breakfast (8-9am): eggs with toast  Lunch (1pm): sandwich, chicken nuggets Dinner (5-6p-biggest meal of the day): stir fry, protein, carb, and vegetable   Patient-reported exercise habits: walks around her neighborhood for 30 minutes a day  DM Prevention:  Statin: Taking; moderate intensity.?  History of chronic kidney disease? yes History of albuminuria? no, last UACR on 05/10/23 = 22 mg/g Last eye exam: Retinopathy Present Tobacco Use:   Tobacco Use: Low Risk  (01/27/2024)   Patient History    Smoking Tobacco Use: Never    Smokeless Tobacco Use: Never    Passive Exposure: Never   O:   Dexcom Clarity Report:  ***   Vitals:  Wt Readings from Last 3 Encounters:  01/27/24 119 lb 12.8 oz (54.3 kg)  01/18/24 118 lb 3.2 oz (53.6 kg)  01/18/24 118 lb (53.5 kg)   BP Readings from Last 3 Encounters:  01/27/24 (!) 147/78  01/19/24 114/71  01/18/24 (!) 142/72   Pulse Readings from Last 3 Encounters:  01/27/24 79  01/19/24 73  01/18/24 70     Labs:?  Lab Results  Component Value Date  HGBA1C 8.0 (H) 10/09/2023   HGBA1C 10.3 (H) 05/10/2023   HGBA1C 9.4 (H) 11/02/2022   GLUCOSE 205 (H) 01/27/2024   MICRALBCREAT 22 05/10/2023   CREATININE 1.86 (H) 01/27/2024   CREATININE 3.07 (H) 01/07/2024   CREATININE 3.15 (H) 01/06/2024    Lab Results  Component Value Date   CHOL 171 12/26/2023    LDLCALC 84 12/26/2023   LDLCALC 49 05/10/2023   LDLCALC 69 12/21/2020   HDL 65 12/26/2023   TRIG 112 12/26/2023   TRIG 249 (H) 05/10/2023   TRIG 107 12/21/2020   ALT 16 01/03/2024   ALT 17 01/03/2024   AST 15 01/03/2024   AST 12 (L) 01/03/2024      Chemistry      Component Value Date/Time   NA 141 01/27/2024 1344   NA 145 (H) 12/21/2023 1438   NA 133 (L) 03/01/2013 1844   K 5.3 (H) 01/27/2024 1344   K 4.1 03/01/2013 1844   CL 115 (H) 01/27/2024 1344   CL 98 03/01/2013 1844   CO2 19 (L) 01/27/2024 1344   CO2 30 03/01/2013 1844   BUN 56 (H) 01/27/2024 1344   BUN 35 (H) 12/21/2023 1438   BUN 13 03/01/2013 1844   CREATININE 1.86 (H) 01/27/2024 1344   CREATININE 0.76 03/01/2013 1844      Component Value Date/Time   CALCIUM  8.5 (L) 01/27/2024 1344   CALCIUM  9.1 03/01/2013 1844   ALKPHOS 65 01/03/2024 1753   ALKPHOS 117 03/01/2013 1844   AST 15 01/03/2024 1753   AST 32 03/01/2013 1844   ALT 16 01/03/2024 1753   ALT 26 03/01/2013 1844   BILITOT 0.8 01/03/2024 1753   BILITOT 0.3 12/21/2023 1438   BILITOT 0.5 03/01/2013 1844       The ASCVD Risk score (Arnett DK, et al., 2019) failed to calculate for the following reasons:   The 2019 ASCVD risk score is only valid for ages 46 to 51   Risk score cannot be calculated because patient has a medical history suggesting prior/existing ASCVD  Lab Results  Component Value Date   MICRALBCREAT 22 05/10/2023    A/P: TIR is **%, not at goal > 70%. ***   Continue Lantus  Solostar pen (1 unit increments) 10 units daily Novolog  Flexpen disposable pen (1 unit increments) dosing Increase ICR 1:25 to ICR 1:22*** Continue ISF 1:100 with correct above 125 mg/dL; Max bolus: 6 units. Educated to utilize BG prior to eating for this correction. -Extensively discussed pathophysiology of diabetes, recommended lifestyle interventions, dietary effects on blood sugar control.  -Counseled on s/sx of and management of hypoglycemia. Educated on  alternate low snacks -Next A1c anticipated 04/2024.   ASCVD risk - primary prevention in patient with diabetes. Last LDL is 84 mg/dL, not at goal of <29 mg/dL.  -Continued rosuvastatin  10 mg daily.   Patient verbalized understanding of treatment plan. Total time patient counseling *** minutes.  Follow-up:  Pharmacist on 01/30/24 PCP clinic visit on 03/19/24  [name], PharmD Clinical Pharmacist [facility] [phone]

## 2024-02-02 ENCOUNTER — Other Ambulatory Visit (INDEPENDENT_AMBULATORY_CARE_PROVIDER_SITE_OTHER)

## 2024-02-02 DIAGNOSIS — Z794 Long term (current) use of insulin: Secondary | ICD-10-CM

## 2024-02-02 DIAGNOSIS — E109 Type 1 diabetes mellitus without complications: Secondary | ICD-10-CM

## 2024-02-02 NOTE — Progress Notes (Signed)
 S:     Chief Complaint  Patient presents with   Diabetes    Reason for visit: ?  Ashley Camacho is a 33 y.o. female with a history of diabetes (type 1), who presents today for a follow up diabetes pharmacotherapy visit.? Pertinent PMH also includes HFrEF, HTN, MVR, CKD stage 4, anxiety, and depression.  Known DM Complications:  gastroparesis, nephropathy, peripheral neuropathy, and retinopathy    Care Team: Primary Care Provider: Ostwalt, [name], PA-C  Previously she was followed by Glenys Curlin, NP at [facility], last visit 09/02/21. Prior to that she was followed by Dr Arnulfo Kells at [facility]. Currently managed by Dr. Standley at [facility]. Last visit was 07/2023.   At that visit in 07/2023, CGM report demonstrated that typical hypoglycemic events were following hyperglycemia episodes, demonstrating overcorrection. She was instructed to administer Novolog  prior to eating. Per endocrinology, she previously used Tandem insulin  pump, but has been on MDI for several years. Insulin  administration has been complicated by gastroparesis and continuous nocturnal tube feeds. She is no longer administering nocturnal tube feeds. Novolog  ICR was 1:15  and ISF was 1:80 with correct above 110 mg/dL.   At last visit with cardiology on 01/27/24, BNP remained elevated, but improved and K was slightly elevated. Kidney function had improved at that time as well. Patient was instructed to restart furosemide  20 mg daily.   At last visit with clinical pharmacist on 01/30/24, she endorsed a migraine over the weekend leading to inconsistent insulin  administration and subsequent hyperglycemia. Carb ratio was adjusted from 1:22 to 1:20 due to rising BG at the time of the call following her meal. Patient set a goal for maintaining a routine with her breakfast and mealtime insulin .  Today, she reports her CGM has been having technical issues. She reports testing BG manually by fingerstick to calibrate  majority of the past couple days. She has one sensor left at home. She reports eating a meal in the middle of the night last night and forgetting to administer insulin . She was subsequently ~400-500 mg/dL this AM. She states she did not eat this morning but gave a correction dose around 1200 her sensor only has a slight down arrow.   Patient reports Diabetes was diagnosed at age 72   Diabetes Medication Regimen  Basal insulin : Lantus  Solostar pen (1 unit increments) 10 units daily Bolus insulin : Novolog  Flexpen disposable pen (1 unit increments)  ICR: 1:20 ISF: 1:100 Target BG: 125 mg/dL (day) Max bolus 6 units  Current hypertension medications include: valsartan  20 mg daily, metoprolol  succinate 25 mg daily, hydralazine  10 mg TID On hold: spironolactone  (CKD) and furosemide  (euvolemic) Current hyperlipidemia medications include: rosuvastatin  10 mg daily  Patient reports adherence to taking all medications as prescribed.   Insurance coverage: [place] Medicaid   Patient reports hypoglycemic events.  Patient reported dietary habits: Eats 3 meals/day Breakfast (8-9am): eggs with toast  Lunch (1pm): sandwich, chicken nuggets Dinner (5-6p-biggest meal of the day): stir fry, protein, carb, and vegetable   Patient-reported exercise habits: walks around her neighborhood for 30 minutes a day  DM Prevention:  Statin: Taking; moderate intensity.?  History of chronic kidney disease? yes History of albuminuria? no, last UACR on 05/10/23 = 22 mg/g Last eye exam: Retinopathy Present Tobacco Use:   Tobacco Use: Low Risk  (01/27/2024)   Patient History    Smoking Tobacco Use: Never    Smokeless Tobacco Use: Never    Passive Exposure: Never   O:  Dexcom Clarity Report:          Vitals:  Wt Readings from Last 3 Encounters:  01/27/24 119 lb 12.8 oz (54.3 kg)  01/18/24 118 lb 3.2 oz (53.6 kg)  01/18/24 118 lb (53.5 kg)   BP Readings from Last 3 Encounters:  01/27/24 (!) 147/78   01/19/24 114/71  01/18/24 (!) 142/72   Pulse Readings from Last 3 Encounters:  01/27/24 79  01/19/24 73  01/18/24 70     Labs:?  Lab Results  Component Value Date   HGBA1C 8.0 (H) 10/09/2023   HGBA1C 10.3 (H) 05/10/2023   HGBA1C 9.4 (H) 11/02/2022   GLUCOSE 205 (H) 01/27/2024   MICRALBCREAT 22 05/10/2023   CREATININE 1.86 (H) 01/27/2024   CREATININE 3.07 (H) 01/07/2024   CREATININE 3.15 (H) 01/06/2024    Lab Results  Component Value Date   CHOL 171 12/26/2023   LDLCALC 84 12/26/2023   LDLCALC 49 05/10/2023   LDLCALC 69 12/21/2020   HDL 65 12/26/2023   TRIG 112 12/26/2023   TRIG 249 (H) 05/10/2023   TRIG 107 12/21/2020   ALT 16 01/03/2024   ALT 17 01/03/2024   AST 15 01/03/2024   AST 12 (L) 01/03/2024      Chemistry      Component Value Date/Time   NA 141 01/27/2024 1344   NA 145 (H) 12/21/2023 1438   NA 133 (L) 03/01/2013 1844   K 5.3 (H) 01/27/2024 1344   K 4.1 03/01/2013 1844   CL 115 (H) 01/27/2024 1344   CL 98 03/01/2013 1844   CO2 19 (L) 01/27/2024 1344   CO2 30 03/01/2013 1844   BUN 56 (H) 01/27/2024 1344   BUN 35 (H) 12/21/2023 1438   BUN 13 03/01/2013 1844   CREATININE 1.86 (H) 01/27/2024 1344   CREATININE 0.76 03/01/2013 1844      Component Value Date/Time   CALCIUM  8.5 (L) 01/27/2024 1344   CALCIUM  9.1 03/01/2013 1844   ALKPHOS 65 01/03/2024 1753   ALKPHOS 117 03/01/2013 1844   AST 15 01/03/2024 1753   AST 32 03/01/2013 1844   ALT 16 01/03/2024 1753   ALT 26 03/01/2013 1844   BILITOT 0.8 01/03/2024 1753   BILITOT 0.3 12/21/2023 1438   BILITOT 0.5 03/01/2013 1844       The ASCVD Risk score (Arnett DK, et al., 2019) failed to calculate for the following reasons:   The 2019 ASCVD risk score is only valid for ages 66 to 30   Risk score cannot be calculated because patient has a medical history suggesting prior/existing ASCVD  Lab Results  Component Value Date   MICRALBCREAT 22 05/10/2023    A/P: TIR is 27%, not at goal > 70%.  It appears that sensitivity factor needs to be adjusted at this time. Will adjust by 10%. Additionally, will adjust correct above to match what was previously set by endocrinology. Will schedule follow up in person to be able to provide more sensors as still awaiting denial letter from insurance to write Dexcom appeal.    Continue Lantus  Solostar pen (1 unit increments) 10 units daily Novolog  Flexpen disposable pen (1 unit increments) dosing Continue ICR 1:20 Increase ISF from 1:100 to 1:90  Change correct above from 125 mg/dL to 889 mg/dL to reflect previous correct above from endocrinology -Extensively discussed pathophysiology of diabetes, recommended lifestyle interventions, dietary effects on blood sugar control.  -Counseled on s/sx of and management of hypoglycemia. Educated on alternate low snacks -Next A1c anticipated 04/2024.  ASCVD risk - primary prevention in patient with diabetes. Last LDL is 84 mg/dL, not at goal of <29 mg/dL.  -Continued rosuvastatin  10 mg daily.   Patient verbalized understanding of treatment plan. Total time patient counseling 20 minutes.  Follow-up:  Pharmacist on 02/08/24 in person PCP clinic visit on 03/19/24  Ashley Camacho, PharmD Clinical Pharmacist [facility] [phone]

## 2024-02-03 DIAGNOSIS — Z419 Encounter for procedure for purposes other than remedying health state, unspecified: Secondary | ICD-10-CM | POA: Diagnosis not present

## 2024-02-06 ENCOUNTER — Telehealth: Payer: Self-pay | Admitting: Cardiology

## 2024-02-06 NOTE — Telephone Encounter (Signed)
 Called to confirm/remind patient of their appointment at the [facility] on 02/07/24.   Appointment:   [x] Confirmed  [] Left mess   [] No answer/No voice mail  [] VM Full/unable to leave message  [] Phone not in service  Patient reminded to bring all medications and/or complete list.  Confirmed patient has transportation. Gave directions, instructed to utilize valet parking.

## 2024-02-07 ENCOUNTER — Other Ambulatory Visit
Admission: RE | Admit: 2024-02-07 | Discharge: 2024-02-07 | Disposition: A | Discharge status: Home / Self Care | Source: Ambulatory Visit | Attending: Cardiology | Admitting: Cardiology

## 2024-02-07 ENCOUNTER — Ambulatory Visit: Admitting: Cardiology

## 2024-02-07 ENCOUNTER — Telehealth: Payer: Self-pay

## 2024-02-07 ENCOUNTER — Ambulatory Visit (HOSPITAL_COMMUNITY): Payer: Self-pay | Admitting: Cardiology

## 2024-02-07 VITALS — BP 131/81 | HR 76 | Wt 118.0 lb

## 2024-02-07 DIAGNOSIS — I5042 Chronic combined systolic (congestive) and diastolic (congestive) heart failure: Secondary | ICD-10-CM | POA: Insufficient documentation

## 2024-02-07 LAB — BASIC METABOLIC PANEL WITH GFR
Anion gap: 9 (ref 5–15)
BUN: 62 mg/dL — ABNORMAL HIGH (ref 6–20)
CO2: 24 mmol/L (ref 22–32)
Calcium: 9 mg/dL (ref 8.9–10.3)
Chloride: 99 mmol/L (ref 98–111)
Creatinine, Ser: 2.52 mg/dL — ABNORMAL HIGH (ref 0.44–1.00)
GFR, Estimated: 25 mL/min — ABNORMAL LOW (ref >60)
Glucose, Bld: 598 mg/dL (ref 70–99)
Potassium: 6.1 mmol/L — ABNORMAL HIGH (ref 3.5–5.1)
Sodium: 132 mmol/L — ABNORMAL LOW (ref 135–145)

## 2024-02-07 LAB — BRAIN NATRIURETIC PEPTIDE: B Natriuretic Peptide: 933.9 pg/mL — ABNORMAL HIGH (ref 0.0–100.0)

## 2024-02-07 MED ORDER — SACUBITRIL-VALSARTAN 24-26 MG PO TABS: 1.0000 | ORAL_TABLET | Freq: Two times a day (BID) | ORAL | 3 refills | Status: DC

## 2024-02-07 NOTE — Progress Notes (Signed)
 Ashley Camacho

## 2024-02-07 NOTE — Patient Instructions (Signed)
 Medication Changes:  STOP Imdur   STOP Valsartan   START Metoprolol   START Entresto  24/26mg  (1 tab) twice daily  Lab Work:  Go over to the [facility]ICAL MALL. Go pass the gift shop and have your blood work completed TODAY AND IN 2 WEEKS.  We will only call you if the results are abnormal or if the provider would like to make medication changes.  No news is good news.   Follow-Up in: Please follow up with the [facility] in 2 weeks with the pharmacist and again in 1 month with Dr. Mclean.    Thank you for choosing [facility].    At the [facility], you and your health needs are our priority. We have a designated team specialized in the treatment of Heart Failure. This Care Team includes your primary Heart Failure Specialized Cardiologist (physician), Advanced Practice Providers (APPs- Physician Assistants and Nurse Practitioners), and Pharmacist who all work together to provide you with the care you need, when you need it.   You may see any of the following providers on your designated Care Team at your next follow up:  Dr. Toribio Fuel Dr. Ezra Shuck Dr. Ria Commander Dr. Morene Brownie Ellouise Class, FNP Jaun Bash, RPH-CPP  Please be sure to bring in all your medications bottles to every appointment.   Need to Contact Us :  If you have any questions or concerns before your next appointment please send us  a message through [place] or call our office at [phone].    TO LEAVE A MESSAGE FOR THE NURSE SELECT OPTION 2, PLEASE LEAVE A MESSAGE INCLUDING: YOUR NAME DATE OF BIRTH CALL BACK NUMBER REASON FOR CALL**this is important as we prioritize the call backs  YOU WILL RECEIVE A CALL BACK THE SAME DAY AS LONG AS YOU CALL BEFORE 4:00 PM

## 2024-02-07 NOTE — Progress Notes (Signed)
 [facility] called with patient's glucose results of 598. Notified Dr. Rolan and he advises patient to go to ED. Called and notified pt. Pt declined and states she will try to get it down herself and just use her insulin . Pt states if it does not go down, she will then go to ER.

## 2024-02-07 NOTE — Progress Notes (Signed)
 PCP: Ostwalt, [name], PA-C HF Cardiology: Dr. Rolan  Chief complaint: CHF  33 y.o. with history of type 1 diabetes, CKD stage 3/diabetic nephropathy, diabetic gastroparesis, adrenal insufficiency, and CHF was referred by Dr. Claudene in the [facility] ER for evaluation of CHF.  She has a rather complex history.  Echo in 7/22 after the birth of her second daughter showed EF 40-45%.  This had declined to 25% by 2/23, ?peripartum CMP.  Cath in 2/23 at [facility] showed no significant CAD.  She had AKI in 3/23 requiring transient dialysis, possible acute interstitial nephritis.  She also had Enterococcal bacteremia that admission in 3/23. She has numerous diabetic complications including gastroparesis, nephropathy, and neuropathy.    She was hospitalized at [facility] in 6/25 for possible PNA.  She was seen in the ER 12/22/23 with dyspnea and LE edema.  She says the edema had been present since 6/25 admission and she think she got too much IVF in the hospital. LE US  showed not DVT.  CXR showed vascular congestion.  BNP was severely elevated.  Interestingly, patient had had an echo in 6/25 at [facility] that I reviewed; this was fairly normal with EF 55-60%, GLS -23.4%, normal diastolic function, RV normal, mild-moderate MR, IVC normal.  Echo did not match clinical picture.  Patient was started on Lasix  20 mg daily when she was sent home from the ER.   Patient was admitted in 8/25 with CHF.  She was diuresed with Lasix  gtt, developed AKI with creatinine up to 3.15. Cardiac MRI showed mild LV dilation with LV EF 35%, RV EF 34%, no delayed enhancement, and possible severe MR with regurgitant fraction 45%.  TEE was done to reassess mitral regurgitation after diuresis, this showed EF 35-40%, only trivial mitral regurgitation and normal IVC.   She returns today for followup of CHF.  She is taking Lasix  20 mg daily.  She was started on valsartan  by NP in this office a few weeks ago. Weight is down 12 lbs.  No dyspnea with usual  activities/ADLs.  Mild dyspnea with stairs.  No chest pain.  Occasional palpitations.  No orthopnea/PND.  No lightheadedness.  She is not taking Toprol  XL.  BP is stable.   ECG (personally reviewed): NSR, anterolateral TWIs  Labs (7/25): HS-TnI 19 => 20, BNP 4236, K 5.2, creatinine 1.82 Labs (8/25): Creatinine 3.1, BNP >4500 Labs (9/25): K 5.3, creatinine 1.86  PMH: 1. Type 1 diabetes with h/o DKA.  2. Diabetic gastroparesis: Severe  3. Adrenal insufficiency: She is on hydrocortisone  4. CKD stage 3: Diabetic nephropathy.  5. Chronic HF with recovered EF: H/o nonischemic cardiomyopathy.  - Echo (7/22): EF 40-45% => ?peri-partum cardiomyopathy.  - Echo (2/23): EF 25% - LHC (2/23, [facility]): no significant CAD.  - Echo (3/23): EF 20% - Echo (10/23): EF 30-35% ([facility]) - Echo (6/25): EF 55-60%, GLS -23.4%, normal diastolic function, RV normal, mild-moderate MR, IVC normal - Cardiac MRI (8/25): mild LV dilation with LV EF 35%, RV EF 34%, no delayed enhancement, and possible severe MR with regurgitant fraction 45%. - TEE (8/25): EF 35-40%, only trivial mitral regurgitation and normal IVC.  6. Diabetic neuropathy.  7. PEA arrest after EGD in 3/23.  8. AKI in 3/23 from possible acute interstitial nephritis, required transient HD.  9. Enterococcal bacteremia in 3/23.  10. RUE DVT in 2022.   SH: Lives with roommate in [place], 2 daughters, nonsmoker, no ETOH.  Not working.   FH: Father with type 1 diabetes, CAD with stent  around age 84, died with cardiac arrest around age 94. Sister with SVT.   ROS: All systems reviewed and negative except as per HPI.   Current Outpatient Medications  Medication Sig Dispense Refill   Accu-Chek Softclix Lancets lancets Use to check blood sugar up to 6 times daily. May substitute to any manufacturer covered by patient's insurance. 100 each 5   acetaminophen  (TYLENOL ) 500 MG tablet Take 2 tablets (1,000 mg total) by mouth every 6 (six) hours as needed for headache,  fever or moderate pain. 30 tablet 3   BAQSIMI ONE PACK 3 MG/DOSE POWD Place 3 mg into the nose as needed.     BD PEN NEEDLE MINI ULTRAFINE 31G X 5 MM MISC by Other route.     Blood Glucose Monitoring Suppl (BLOOD GLUCOSE MONITOR SYSTEM) w/Device KIT Use to check blood sugar up to 6 times daily. May substitute to any manufacturer covered by patient's insurance. 1 kit 0   Continuous Glucose Sensor (DEXCOM G7 SENSOR) MISC Use to check blood sugar. Change every 10 days 3 each 3   divalproex  (DEPAKOTE ) 250 MG DR tablet Take 250 mg by mouth in the morning.     furosemide  (LASIX ) 20 MG tablet Take 2 tablets (40 mg total) by mouth daily. (Patient taking differently: Take 20 mg by mouth daily.) 60 tablet 5   Glucose Blood (BLOOD GLUCOSE TEST STRIPS) STRP Use to check blood sugar up to 6 times daily. May substitute to any manufacturer covered by patient's insurance. 100 strip 5   hydrocortisone  (CORTEF ) 10 MG tablet Take 2 tablets (20 mg total) by mouth daily with breakfast AND 1 tablet (10 mg total) daily with supper. 90 tablet 0   insulin  aspart (NOVOLOG ) 100 UNIT/ML FlexPen Inject 2 Units into the skin 3 (three) times daily with meals. If eating and Blood Glucose (BG) 80 or higher inject 2 units for meal coverage and add correction dose per scale. If not eating, correction dose only. BG <150= 0 unit; BG 150-200= 1 unit; BG 201-250= 3 unit; BG 251-300= 5 unit; BG 301-350= 7 unit; BG 351-400= 9 unit; BG >400= 11 unit and Call Primary Care.     insulin  glargine (LANTUS ) 100 UNIT/ML Solostar Pen Inject 10 Units into the skin daily.     Insulin  Pen Needle (PEN NEEDLES) 31G X 6 MM MISC PEN NEEDLES 31G X 6 MM     Lancet Device MISC Use to check blood sugar up to 6 times daily. May substitute to any manufacturer covered by patient's insurance. 1 each 1   levothyroxine  (SYNTHROID ) 75 MCG tablet TAKE 1 TABLET (75 MCG TOTAL) BY MOUTH DAILY AT 6 (SIX) AM. 90 tablet 0   metoprolol  succinate (TOPROL -XL) 25 MG 24 hr tablet  Take 1 tablet (25 mg total) by mouth daily. 30 tablet 0   Pancrelipase , Lip-Prot-Amyl, 24000-76000 units CPEP Take 1 capsule (24,000 Units total) by mouth 3 (three) times daily before meals. 270 capsule 0   rosuvastatin  (CRESTOR ) 10 MG tablet Take 1 tablet (10 mg total) by mouth daily. 30 tablet 11   sacubitril -valsartan  (ENTRESTO ) 24-26 MG Take 1 tablet by mouth 2 (two) times daily. 180 tablet 3   [Paused] spironolactone  (ALDACTONE ) 25 MG tablet Take 0.5 tablets (12.5 mg total) by mouth daily. 45 tablet 3   hydrALAZINE  (APRESOLINE ) 10 MG tablet Take 1 tablet (10 mg total) by mouth with breakfast, with lunch, and with evening meal. (Patient not taking: Reported on 02/07/2024) 90 tablet 0   No current  facility-administered medications for this visit.   BP 131/81   Pulse 76   Wt 118 lb (53.5 kg)   SpO2 100%   BMI 22.30 kg/m  General: NAD Neck: No JVD, no thyromegaly or thyroid  nodule.  Lungs: Clear to auscultation bilaterally with normal respiratory effort. CV: Nondisplaced PMI.  Heart regular S1/S2, no S3/S4, no murmur.  No peripheral edema.  No carotid bruit.  Normal pedal pulses.  Abdomen: Soft, nontender, no hepatosplenomegaly, no distention.  Skin: Intact without lesions or rashes.  Neurologic: Alert and oriented x 3.  Psych: Normal affect. Extremities: No clubbing or cyanosis.  HEENT: Normal.   Assessment/Plan: 1. Chronic systolic CHF: Patient has history of nonischemic cardiomyopathy that was primarily followed at [facility] in the past.  There was concern that this could be a peri-partum cardiomyopathy.  Echo in 7/22 after the birth of her second daughter showed EF 40-45%.  This had declined to 25% by 2/23, ?peripartum CMP.  Cath in 2/23 at [facility] showed no significant CAD.  However, echo in 6/25 at [facility] was reviewed and noted to be relatively normal except for mild to moderate MR => EF 55-60%, GLS -23.4%, normal diastolic function, RV normal, mild-moderate MR, IVC normal.  Despite the  unremarkable echo in 6/25, she struggled with volume overload and was admitted in 8/25 with CHF.  Cardiac MRI in 8/25 showed moderately depressed LV EF 35% and RV EF 34%, no LGE/scarring noted, mitral regurgitation looked severe.  This was a significant change from 6/25 echo.  I think coronary disease is unlikely with no LGE on cMRI and normal cath in 2023.  Would avoid coronary angiography with elevated creatinine.  Possible diabetic cardiomyopathy. TEE was done after diuresis to assess MR, this showed EF 35-40%, mild RV dysfunction, and only trivial mitral regurgitation. She developed AKI with diuresis during 8/25 admission, but most recent creatinine back down to 1.86. NYHA class II currently, not volume overloaded on exam.  BP stable.  - She can continue Lasix  20 mg daily.  - She is off hydralazine  so can stop Imdur .  - Restart Toprol  XL 25 mg daily.  - With BP 131/81 and valsartan  use, will transition to Entresto .  Stop valsartan , start Entresto  24/26 bid with BMET/BNP today and BMET in 10 days.  - She is not a candidate for SGLT2 inhibitor with type 1 diabetes.  2. Hyperlipidemia: continue crestor  10 mg daily.  3. CKD stage 3: Diabetic nephropathy.  Creatinine baseline around 2, up to 3.15 with elevated BUN with diuresis in 8/25.  Now back down to 1.86 when checked earlier in 9/25.  - Follow closely, BMET today.  4. Type 1 diabetes: Poor control.  Needs close followup.  5. HTN: BP stable, med adjustment as above.   Followup with HF pharmacist in 2 wks for med titration, see me in 1 month.   I spent 32 minutes reviewing records, interviewing/examining patient, and managing orders.   Ezra Shuck 02/07/2024

## 2024-02-08 ENCOUNTER — Other Ambulatory Visit

## 2024-02-08 ENCOUNTER — Ambulatory Visit

## 2024-02-08 ENCOUNTER — Telehealth: Payer: Self-pay

## 2024-02-08 NOTE — Telephone Encounter (Signed)
 Brief Telephone Documentation Reason for Call: Appointment rescheduling  Summary of Call: Called patient to reschedule pharmacy appointment for today. Left message with direct call back number  [name], PharmD Clinical Pharmacist [facility] [phone]

## 2024-02-08 NOTE — Progress Notes (Deleted)
 S:     No chief complaint on file.   Reason for visit: ?  Ashley Camacho is a 33 y.o. female with a history of diabetes (type 1), who presents today for a follow up diabetes pharmacotherapy visit.? Pertinent PMH also includes HFrEF, HTN, MVR, CKD stage 4, anxiety, and depression.  Known DM Complications:  gastroparesis, nephropathy, peripheral neuropathy, and retinopathy    Care Team: Primary Care Provider: Ostwalt, [name], PA-C  Previously she was followed by Ashley Curlin, NP at [facility], last visit 09/02/21. Prior to that she was followed by Dr Ashley Camacho at [facility]. Currently managed by Dr. Standley at [facility]. Last visit was 07/2023.   At that visit in 07/2023, CGM report demonstrated that typical hypoglycemic events were following hyperglycemia episodes, demonstrating overcorrection. She was instructed to administer Novolog  prior to eating. Per endocrinology, she previously used Tandem insulin  pump, but has been on MDI for several years. Insulin  administration has been complicated by gastroparesis and continuous nocturnal tube feeds. She is no longer administering nocturnal tube feeds. Novolog  ICR was 1:15  and ISF was 1:80 with correct above 110 mg/dL.    At last visit with clinical pharmacist on 02/02/24, she reported technical issues with her Dexcom requiring manual calibration. ISF was adjusted from 1:100 to 1:90 and correct above of 110 mg/dL as she reported only a small decrease in BG, not within range, when administering a correction dose.   At last visit with cardiology on 02/07/24, ***  Patient reports Diabetes was diagnosed at age 74   Diabetes Medication Regimen  Basal insulin : Lantus  Solostar pen (1 unit increments) 10 units daily Bolus insulin : Novolog  Flexpen disposable pen (1 unit increments)  ICR: 1:20 ISF: 1:100 Target BG: 125 mg/dL (day) Max bolus 6 units  Current hypertension medications include: valsartan  20 mg daily, metoprolol   succinate 25 mg daily, hydralazine  10 mg TID, furosemide  20 mg daily On hold: spironolactone  (CKD)  Current hyperlipidemia medications include: rosuvastatin  10 mg daily  Patient reports adherence to taking all medications as prescribed.   Insurance coverage: [employer]   Patient reports hypoglycemic events.  Patient reported dietary habits: Eats 3 meals/day Breakfast (8-9am): eggs with toast  Lunch (1pm): sandwich, chicken nuggets Dinner (5-6p-biggest meal of the day): stir fry, protein, carb, and vegetable   Patient-reported exercise habits: walks around her neighborhood for 30 minutes a day  DM Prevention:  Statin: Taking; moderate intensity.?  History of chronic kidney disease? yes History of albuminuria? no, last UACR on 05/10/23 = 22 mg/g Last eye exam: Retinopathy Present Tobacco Use:   Tobacco Use: Low Risk  (01/27/2024)   Patient History    Smoking Tobacco Use: Never    Smokeless Tobacco Use: Never    Passive Exposure: Never   O:   Dexcom Clarity Report:  ***    Vitals:  Wt Readings from Last 3 Encounters:  02/07/24 118 lb (53.5 kg)  01/27/24 119 lb 12.8 oz (54.3 kg)  01/18/24 118 lb 3.2 oz (53.6 kg)   BP Readings from Last 3 Encounters:  02/07/24 131/81  01/27/24 (!) 147/78  01/19/24 114/71   Pulse Readings from Last 3 Encounters:  02/07/24 76  01/27/24 79  01/19/24 73     Labs:?  Lab Results  Component Value Date   HGBA1C 8.0 (H) 10/09/2023   HGBA1C 10.3 (H) 05/10/2023   HGBA1C 9.4 (H) 11/02/2022   GLUCOSE 598 (HH) 02/07/2024   MICRALBCREAT 22 05/10/2023   CREATININE 2.52 (H) 02/07/2024  CREATININE 1.86 (H) 01/27/2024   CREATININE 3.07 (H) 01/07/2024    Lab Results  Component Value Date   CHOL 171 12/26/2023   LDLCALC 84 12/26/2023   LDLCALC 49 05/10/2023   LDLCALC 69 12/21/2020   HDL 65 12/26/2023   TRIG 112 12/26/2023   TRIG 249 (H) 05/10/2023   TRIG 107 12/21/2020   ALT 16 01/03/2024   ALT 17 01/03/2024   AST 15 01/03/2024    AST 12 (L) 01/03/2024      Chemistry      Component Value Date/Time   NA 132 (L) 02/07/2024 1557   NA 145 (H) 12/21/2023 1438   NA 133 (L) 03/01/2013 1844   K 6.1 (H) 02/07/2024 1557   K 4.1 03/01/2013 1844   CL 99 02/07/2024 1557   CL 98 03/01/2013 1844   CO2 24 02/07/2024 1557   CO2 30 03/01/2013 1844   BUN 62 (H) 02/07/2024 1557   BUN 35 (H) 12/21/2023 1438   BUN 13 03/01/2013 1844   CREATININE 2.52 (H) 02/07/2024 1557   CREATININE 0.76 03/01/2013 1844      Component Value Date/Time   CALCIUM  9.0 02/07/2024 1557   CALCIUM  9.1 03/01/2013 1844   ALKPHOS 65 01/03/2024 1753   ALKPHOS 117 03/01/2013 1844   AST 15 01/03/2024 1753   AST 32 03/01/2013 1844   ALT 16 01/03/2024 1753   ALT 26 03/01/2013 1844   BILITOT 0.8 01/03/2024 1753   BILITOT 0.3 12/21/2023 1438   BILITOT 0.5 03/01/2013 1844       The ASCVD Risk score (Arnett DK, et al., 2019) failed to calculate for the following reasons:   The 2019 ASCVD risk score is only valid for ages 10 to 49   Risk score cannot be calculated because patient has a medical history suggesting prior/existing ASCVD  Lab Results  Component Value Date   MICRALBCREAT 22 05/10/2023    A/P: TIR is ***%, not at goal > 70%. ***   Continue Lantus  Solostar pen (1 unit increments) 10 units daily Novolog  Flexpen disposable pen (1 unit increments) dosing *** Continue ICR 1:20 Increase ISF from 1:100 to 1:90  Change correct above from 125 mg/dL to 889 mg/dL to reflect previous correct above from endocrinology -Extensively discussed pathophysiology of diabetes, recommended lifestyle interventions, dietary effects on blood sugar control.  -Counseled on s/sx of and management of hypoglycemia. Educated on alternate low snacks -Next A1c anticipated 04/2024.   ASCVD risk - primary prevention in patient with diabetes. Last LDL is 84 mg/dL, not at goal of <29 mg/dL.  -Continued rosuvastatin  10 mg daily.   Written patient instructions provided.  Patient verbalized understanding of treatment plan.  Total time in face to face counseling *** minutes.     Follow-up:  Pharmacist on *** PCP clinic visit on 03/19/24  [name], PharmD Clinical Pharmacist [facility] [phone]

## 2024-02-08 NOTE — Telephone Encounter (Signed)
 Left message for pt to call us  back for urgent lab results. Left message on pt and pt's mother's voicemail.

## 2024-02-09 ENCOUNTER — Other Ambulatory Visit: Payer: Self-pay

## 2024-02-09 ENCOUNTER — Observation Stay
Admission: EM | Admit: 2024-02-09 | Discharge: 2024-02-10 | Disposition: A | Discharge status: Home / Self Care | Attending: Internal Medicine | Admitting: Internal Medicine

## 2024-02-09 ENCOUNTER — Other Ambulatory Visit

## 2024-02-09 ENCOUNTER — Encounter: Payer: Self-pay | Admitting: Intensive Care

## 2024-02-09 DIAGNOSIS — Z794 Long term (current) use of insulin: Secondary | ICD-10-CM | POA: Diagnosis not present

## 2024-02-09 DIAGNOSIS — E875 Hyperkalemia: Secondary | ICD-10-CM | POA: Diagnosis not present

## 2024-02-09 DIAGNOSIS — Z79899 Other long term (current) drug therapy: Secondary | ICD-10-CM | POA: Insufficient documentation

## 2024-02-09 DIAGNOSIS — I110 Hypertensive heart disease with heart failure: Secondary | ICD-10-CM | POA: Diagnosis not present

## 2024-02-09 DIAGNOSIS — R739 Hyperglycemia, unspecified: Secondary | ICD-10-CM

## 2024-02-09 DIAGNOSIS — I509 Heart failure, unspecified: Secondary | ICD-10-CM | POA: Diagnosis not present

## 2024-02-09 DIAGNOSIS — E1065 Type 1 diabetes mellitus with hyperglycemia: Secondary | ICD-10-CM | POA: Diagnosis not present

## 2024-02-09 DIAGNOSIS — N179 Acute kidney failure, unspecified: Secondary | ICD-10-CM | POA: Insufficient documentation

## 2024-02-09 LAB — CBC WITH DIFFERENTIAL/PLATELET
Abs Immature Granulocytes: 0.01 K/uL (ref 0.00–0.07)
Basophils Absolute: 0.1 K/uL (ref 0.0–0.1)
Basophils Relative: 1 %
Eosinophils Absolute: 0.1 K/uL (ref 0.0–0.5)
Eosinophils Relative: 2 %
HCT: 28.9 % — ABNORMAL LOW (ref 36.0–46.0)
Hemoglobin: 9.6 g/dL — ABNORMAL LOW (ref 12.0–15.0)
Immature Granulocytes: 0 %
Lymphocytes Relative: 27 %
Lymphs Abs: 1.6 K/uL (ref 0.7–4.0)
MCH: 30.7 pg (ref 26.0–34.0)
MCHC: 33.2 g/dL (ref 30.0–36.0)
MCV: 92.3 fL (ref 80.0–100.0)
Monocytes Absolute: 0.4 K/uL (ref 0.1–1.0)
Monocytes Relative: 7 %
Neutro Abs: 3.6 K/uL (ref 1.7–7.7)
Neutrophils Relative %: 63 %
Platelets: 143 K/uL — ABNORMAL LOW (ref 150–400)
RBC: 3.13 MIL/uL — ABNORMAL LOW (ref 3.87–5.11)
RDW: 13.5 % (ref 11.5–15.5)
WBC: 5.7 K/uL (ref 4.0–10.5)
nRBC: 0 % (ref 0.0–0.2)

## 2024-02-09 LAB — URINALYSIS, COMPLETE (UACMP) WITH MICROSCOPIC
Bacteria, UA: NONE SEEN
Bilirubin Urine: NEGATIVE
Glucose, UA: >=500 mg/dL — AB
Ketones, ur: NEGATIVE mg/dL
Leukocytes,Ua: NEGATIVE
Nitrite: NEGATIVE
Protein, ur: 100 mg/dL — AB
Specific Gravity, Urine: 1.013 (ref 1.005–1.030)
pH: 7 (ref 5.0–8.0)

## 2024-02-09 LAB — COMPREHENSIVE METABOLIC PANEL WITH GFR
ALT: 44 U/L (ref 0–44)
AST: 37 U/L (ref 15–41)
Albumin: 3.8 g/dL (ref 3.5–5.0)
Alkaline Phosphatase: 61 U/L (ref 38–126)
Anion gap: 10 (ref 5–15)
BUN: 59 mg/dL — ABNORMAL HIGH (ref 6–20)
CO2: 24 mmol/L (ref 22–32)
Calcium: 8.3 mg/dL — ABNORMAL LOW (ref 8.9–10.3)
Chloride: 103 mmol/L (ref 98–111)
Creatinine, Ser: 2.62 mg/dL — ABNORMAL HIGH (ref 0.44–1.00)
GFR, Estimated: 24 mL/min — ABNORMAL LOW (ref >60)
Glucose, Bld: 462 mg/dL — ABNORMAL HIGH (ref 70–99)
Potassium: 6.1 mmol/L — ABNORMAL HIGH (ref 3.5–5.1)
Sodium: 137 mmol/L (ref 135–145)
Total Bilirubin: 0.6 mg/dL (ref 0.0–1.2)
Total Protein: 6.6 g/dL (ref 6.5–8.1)

## 2024-02-09 LAB — CBG MONITORING, ED: Glucose-Capillary: 385 mg/dL — ABNORMAL HIGH (ref 70–99)

## 2024-02-09 LAB — GLUCOSE, CAPILLARY: Glucose-Capillary: 223 mg/dL — ABNORMAL HIGH (ref 70–99)

## 2024-02-09 LAB — POTASSIUM: Potassium: 3.9 mmol/L (ref 3.5–5.1)

## 2024-02-09 LAB — HCG, QUANTITATIVE, PREGNANCY: hCG, Beta Chain, Quant, S: 1 m[IU]/mL (ref <5)

## 2024-02-09 MED ORDER — ONDANSETRON HCL 4 MG/2ML IJ SOLN: 4.0000 mg | Freq: Four times a day (QID) | INTRAMUSCULAR | Status: DC | PRN

## 2024-02-09 MED ORDER — INSULIN ASPART 100 UNIT/ML IJ SOLN
0.0000 [IU] | Freq: Three times a day (TID) | INTRAMUSCULAR | Status: DC
  Administered 2024-02-09: 9 [IU] via SUBCUTANEOUS
  Administered 2024-02-10: 2 [IU] via SUBCUTANEOUS
  Administered 2024-02-10: 3 [IU] via SUBCUTANEOUS
  Filled 2024-02-09 (×2): qty 1
  Filled 2024-02-09: qty 9

## 2024-02-09 MED ORDER — PANCRELIPASE (LIP-PROT-AMYL) 12000-38000 UNITS PO CPEP
24000.0000 [IU] | ORAL_CAPSULE | Freq: Three times a day (TID) | ORAL | Status: DC
Start: 2024-02-09 — End: 2024-02-10
  Administered 2024-02-09 – 2024-02-10 (×3): 24000 [IU] via ORAL
  Filled 2024-02-09 (×3): qty 2

## 2024-02-09 MED ORDER — DIVALPROEX SODIUM 250 MG PO DR TAB
250.0000 mg | DELAYED_RELEASE_TABLET | Freq: Every morning | ORAL | Status: DC
  Administered 2024-02-10: 250 mg via ORAL
  Filled 2024-02-09: qty 1

## 2024-02-09 MED ORDER — ONDANSETRON HCL 4 MG PO TABS
4.0000 mg | ORAL_TABLET | Freq: Four times a day (QID) | ORAL | Status: DC | PRN
  Administered 2024-02-10: 4 mg via ORAL
  Filled 2024-02-09: qty 1

## 2024-02-09 MED ORDER — ALBUTEROL SULFATE (2.5 MG/3ML) 0.083% IN NEBU
10.0000 mg | INHALATION_SOLUTION | Freq: Once | RESPIRATORY_TRACT | Status: AC
  Administered 2024-02-09: 10 mg via RESPIRATORY_TRACT
  Filled 2024-02-09: qty 12

## 2024-02-09 MED ORDER — HYDRALAZINE HCL 10 MG PO TABS
10.0000 mg | ORAL_TABLET | Freq: Two times a day (BID) | ORAL | Status: DC
  Administered 2024-02-09 – 2024-02-10 (×2): 10 mg via ORAL
  Filled 2024-02-09 (×2): qty 1

## 2024-02-09 MED ORDER — HYDROCORTISONE 10 MG PO TABS
20.0000 mg | ORAL_TABLET | Freq: Every day | ORAL | Status: DC
  Administered 2024-02-10: 20 mg via ORAL
  Filled 2024-02-09: qty 2

## 2024-02-09 MED ORDER — ISOSORBIDE MONONITRATE ER 30 MG PO TB24
30.0000 mg | ORAL_TABLET | Freq: Every day | ORAL | Status: DC
  Administered 2024-02-09 – 2024-02-10 (×2): 30 mg via ORAL
  Filled 2024-02-09 (×2): qty 1

## 2024-02-09 MED ORDER — ACETAMINOPHEN 325 MG PO TABS
650.0000 mg | ORAL_TABLET | Freq: Four times a day (QID) | ORAL | Status: DC | PRN
  Administered 2024-02-10: 650 mg via ORAL
  Filled 2024-02-09: qty 2

## 2024-02-09 MED ORDER — LEVOTHYROXINE SODIUM 50 MCG PO TABS
75.0000 ug | ORAL_TABLET | Freq: Every day | ORAL | Status: DC
  Administered 2024-02-10: 75 ug via ORAL
  Filled 2024-02-09: qty 1

## 2024-02-09 MED ORDER — ROSUVASTATIN CALCIUM 10 MG PO TABS
10.0000 mg | ORAL_TABLET | Freq: Every day | ORAL | Status: DC
  Administered 2024-02-10: 10 mg via ORAL
  Filled 2024-02-09: qty 1

## 2024-02-09 MED ORDER — SODIUM CHLORIDE 0.9 % IV BOLUS
500.0000 mL | Freq: Once | INTRAVENOUS | Status: AC
  Administered 2024-02-09: 500 mL via INTRAVENOUS

## 2024-02-09 MED ORDER — METOPROLOL SUCCINATE ER 25 MG PO TB24
25.0000 mg | ORAL_TABLET | Freq: Every day | ORAL | Status: DC
  Administered 2024-02-10: 25 mg via ORAL
  Filled 2024-02-09: qty 1

## 2024-02-09 MED ORDER — HYDRALAZINE HCL 50 MG PO TABS
25.0000 mg | ORAL_TABLET | Freq: Two times a day (BID) | ORAL | Status: DC
Start: 2024-02-09 — End: 2024-02-09

## 2024-02-09 MED ORDER — INSULIN GLARGINE-YFGN 100 UNIT/ML ~~LOC~~ SOLN
10.0000 [IU] | Freq: Every day | SUBCUTANEOUS | Status: DC
Start: 2024-02-09 — End: 2024-02-09

## 2024-02-09 MED ORDER — HYDROCORTISONE 10 MG PO TABS
10.0000 mg | ORAL_TABLET | Freq: Every day | ORAL | Status: DC
  Administered 2024-02-09: 10 mg via ORAL
  Filled 2024-02-09 (×2): qty 1

## 2024-02-09 MED ORDER — ACETAMINOPHEN 650 MG RE SUPP: 650.0000 mg | Freq: Four times a day (QID) | RECTAL | Status: DC | PRN

## 2024-02-09 MED ORDER — SODIUM ZIRCONIUM CYCLOSILICATE 10 G PO PACK
10.0000 g | PACK | Freq: Once | ORAL | Status: AC
Start: 2024-02-09 — End: 2024-02-09
  Administered 2024-02-09: 10 g via ORAL
  Filled 2024-02-09: qty 1

## 2024-02-09 MED ORDER — INSULIN GLARGINE 100 UNIT/ML ~~LOC~~ SOLN
10.0000 [IU] | Freq: Every day | SUBCUTANEOUS | Status: DC
  Administered 2024-02-10: 10 [IU] via SUBCUTANEOUS
  Filled 2024-02-09: qty 0.1

## 2024-02-09 MED ORDER — LINAGLIPTIN 5 MG PO TABS
5.0000 mg | ORAL_TABLET | Freq: Every day | ORAL | Status: DC
  Administered 2024-02-09 – 2024-02-10 (×2): 5 mg via ORAL
  Filled 2024-02-09 (×2): qty 1

## 2024-02-09 MED ORDER — INSULIN ASPART 100 UNIT/ML IV SOLN
5.0000 [IU] | Freq: Once | INTRAVENOUS | Status: AC
Start: 2024-02-09 — End: 2024-02-09
  Administered 2024-02-09: 5 [IU] via INTRAVENOUS
  Filled 2024-02-09: qty 0.05

## 2024-02-09 MED ORDER — CALCIUM GLUCONATE 10 % IV SOLN
1.0000 g | Freq: Once | INTRAVENOUS | Status: AC
  Administered 2024-02-09: 1 g via INTRAVENOUS
  Filled 2024-02-09: qty 10

## 2024-02-09 MED ORDER — INSULIN ASPART 100 UNIT/ML IJ SOLN
0.0000 [IU] | Freq: Every day | INTRAMUSCULAR | Status: DC
  Administered 2024-02-09: 2 [IU] via SUBCUTANEOUS
  Filled 2024-02-09: qty 1

## 2024-02-09 MED ORDER — METOPROLOL SUCCINATE ER 25 MG PO TB24
25.0000 mg | ORAL_TABLET | Freq: Every day | ORAL | Status: DC
Start: 2024-02-10 — End: 2024-02-09

## 2024-02-09 NOTE — ED Triage Notes (Signed)
 Patient sent by PCP for abnormal labs drawn on 02/07/24. 6.1 potassium and 500s blood sugar.  Patient denies pain/SOB/chest pain     History CHF, kidney disease, and diabetic

## 2024-02-09 NOTE — Telephone Encounter (Signed)
 Pt called back. Advised to to got ED per dr. Mclean. Relayed med changes. Pt agreeable to got to ED.

## 2024-02-09 NOTE — Addendum Note (Signed)
 Addended by: SHARL GRATE A on: 02/09/2024 11:58 AM   Modules accepted: Orders

## 2024-02-09 NOTE — ED Provider Notes (Signed)
 [facility] Provider Note    Event Date/Time   First MD Initiated Contact with Patient 02/09/24 1357     (approximate)   History   Abnormal Lab  Patient sent by PCP for abnormal labs drawn on 02/07/24. 6.1 potassium and 500s blood sugar.  Patient denies pain/SOB/chest pain     History CHF, kidney disease, and diabetic   HPI Ashley Camacho is a 33 y.o. 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  No leg swelling.  No difficulty urinating. - Has required transient dialysis in the past (March/2023 per chart review.  Review of cardiology note on 02/07/2024, stopping valsartan , starting Entresto .  Continued Lasix  20 mg daily.  Stop Imdur  (is already off hydralazine ).      Physical Exam   Triage Vital Signs: ED Triage Vitals  Encounter Vitals Group     BP 02/09/24 1350 136/80     Girls Systolic BP Percentile --      Girls Diastolic BP Percentile --      Boys Systolic BP Percentile --      Boys Diastolic BP Percentile --      Pulse Rate 02/09/24 1350 84     Resp 02/09/24 1350 16     Temp 02/09/24 1350 98.9 F (37.2 C)     Temp Source 02/09/24 1350 Oral     SpO2 02/09/24 1350 100 %     Weight 02/09/24 1351 119 lb (54 kg)     Height 02/09/24 1351 5' 1 (1.549 m)     Head Circumference --      Peak Flow --      Pain Score 02/09/24 1350 0     Pain Loc --      Pain Education --      Exclude from Growth Chart --     Most recent vital signs: Vitals:   02/09/24 1350  BP: 136/80  Pulse: 84  Resp: 16  Temp: 98.9 F (37.2 C)  SpO2: 100%     General: Awake, no distress.  CV:  Good peripheral perfusion. RRR, RP 2+, no BLE edema Resp:  Normal effort. CTAB Abd:  No distention. Nontender to deep palpation  throughout   ED Results / Procedures / Treatments   Labs (all labs ordered are listed, but only abnormal results are displayed) Labs Reviewed  CBC WITH DIFFERENTIAL/PLATELET - Abnormal; Notable for the following components:      Result Value   RBC 3.13 (*)    Hemoglobin 9.6 (*)    HCT 28.9 (*)    Platelets 143 (*)    All other components within normal limits  COMPREHENSIVE METABOLIC PANEL WITH GFR - Abnormal; Notable for the following components:   Potassium 6.1 (*)    Glucose, Bld 462 (*)    BUN 59 (*)    Creatinine, Ser 2.62 (*)    Calcium  8.3 (*)    GFR, Estimated 24 (*)    All other components within normal limits  URINALYSIS, COMPLETE (UACMP) WITH MICROSCOPIC - Abnormal; Notable for the following components:   Color, Urine STRAW (*)    APPearance CLEAR (*)    Glucose, UA >=500 (*)    Hgb urine dipstick SMALL (*)    Protein, ur 100 (*)    All other components within normal  limits  HCG, QUANTITATIVE, PREGNANCY     EKG  Ecg = sinus rhythm, rate 81, no gross ST elevation or depression, some T wave inversions noted in leads I, 2, aVL (similar to prior).  Left axis deviation.  Patient does appear to have new peaked T waves in V1 and V2--similar to prior on 02/07/2024 but she was also hyperkalemic to 6.1 at that time.  Prior EKGs from last month do not show this.   RADIOLOGY N/a    PROCEDURES:  Critical Care performed: Yes, see critical care procedure note(s)  .Critical Care  Performed by: Clarine Ozell LABOR, MD Authorized by: Clarine Ozell LABOR, MD   Critical care provider statement:    Critical care time (minutes):  30   Critical care time was exclusive of:  Separately billable procedures and treating other patients   Critical care was necessary to treat or prevent imminent or life-threatening deterioration of the following conditions:  Metabolic crisis   Critical care was time spent personally by me on the following activities:  Development of treatment plan with  patient or surrogate, discussions with consultants, evaluation of patient's response to treatment, examination of patient, ordering and review of laboratory studies, ordering and review of radiographic studies, ordering and performing treatments and interventions, pulse oximetry, re-evaluation of patient's condition and review of old charts   I assumed direction of critical care for this patient from another provider in my specialty: no     Care discussed with: admitting provider      MEDICATIONS ORDERED IN ED: Medications  insulin  aspart (novoLOG ) injection 5 Units (has no administration in time range)  sodium zirconium cyclosilicate  (LOKELMA ) packet 10 g (has no administration in time range)  albuterol  (PROVENTIL ) (2.5 MG/3ML) 0.083% nebulizer solution 10 mg (has no administration in time range)  calcium  gluconate inj 10% (1 g) URGENT USE ONLY! (has no administration in time range)  sodium chloride  0.9 % bolus 500 mL (has no administration in time range)     IMPRESSION / MDM / ASSESSMENT AND PLAN / ED COURSE  I reviewed the triage vital signs and the nursing notes.                              DDX/MDM/AP: Differential diagnosis includes, but is not limited to, hyperkalemia, consider underlying worsening renal function, consider medication side effect, doubt obstructive uropathy based on history and exam.  Plan: - Labs - Cardiac monitor - EKG  Patient's presentation is most consistent with acute presentation with potential threat to life or bodily function.  The patient is on the cardiac monitor to evaluate for evidence of arrhythmia and/or significant heart rate changes.  ED course below.  Workup with persistent hyperkalemia to 6.1 and creatinine continues to uptrend.  Valsartan  was stopped 2 days ago, suspect this may have been contributing.  Given peaked T waves on recent EKGs, concern for this being secondary to hyperkalemia.  Treating with calcium , insulin , Lokelma , albuterol ,  small bolus IV fluid.  Hyperglycemic though no evidence of DKA, receiving insulin  as above.  Admitted to hospitalist service.  Clinical Course as of 02/09/24 1519  Thu Feb 09, 2024  1448 CMP with redemonstration of hyperkalemia and AKI on CKD [MM]  1448 EKG concerning for peaked T waves, suspect related to hyperkalemia, will treat aggressively [MM]  1448 CBC reviewed, unremarkable, hemoglobin within baseline range [MM]  1449 CMP also with hyperglycemia to the 460s, anion gap normal, bicarb  normal--not consistent with DKA [MM]  1517 UA w/ no e/o infxn, no hematuria, no ketones [MM]    Clinical Course User Index [MM] Clarine Ozell LABOR, MD     FINAL CLINICAL IMPRESSION(S) / ED DIAGNOSES   Final diagnoses:  Hyperkalemia  AKI (acute kidney injury) (HCC)  Hyperglycemia     Rx / DC Orders   ED Discharge Orders     None        Note:  This document was prepared using Dragon voice recognition software and may include unintentional dictation errors.   Clarine Ozell LABOR, MD 02/09/24 [phone]

## 2024-02-09 NOTE — H&P (Signed)
 History and Physical    Ashley HEPBURN FMW:[id number] DOB: 06-21-1990 DOA: 02/09/2024  PCP: Ostwalt, [name], PA-C (Confirm with patient/family/NH records and if not entered, this has to be entered at [facility] point of entry) Patient coming from: Home   I have personally briefly reviewed patient's old medical records in [facility]  Chief Complaint: I was sent here by my doctor  HPI: Ashley Camacho is a 33 y.o. female with medical history significant of IDDM, CKD stage IV, diabetic gastroparesis, adrenal insufficiency, chronic HFrEF with LVEF 25% in 2023, sent from PCPs office for evaluation of worsening of hyperkalemia.  Patient has no complaints, she went to see her PCP today and blood work showed potassium level 6.1.  Patient reported that since this summer, her BP/CHF medications have been adjusted multiple times, her current regimen including losartan , and hydralazine .  Recently, metoprolol  and Aldactone  were discontinued.  There was a plan to transition from valsartan  to Entresto  later this week.  ED Course: Afebrile, no tachycardia no hypotension blood pressure 136/80 O2 sat from 100% on room air, blood work showed K6.1 hemoglobin 9.6 BUN 59 creatinine 2.6 compared to baseline 2.5-3.0, glucose 463 bicarb 24.  EKG showed suspicious tented T waves on multiple leads.  Hyperkalemia cocktail including calcium  gluconate, insulin , albuterol  and route, or given in the ED.    Review of Systems: As per HPI otherwise 14 point review of systems negative.    Past Medical History:  Diagnosis Date   Acute metabolic encephalopathy 06/10/2022   Adrenal insufficiency (HCC)    Anemia    Anxiety    Blood transfusion without reported diagnosis    CHF (congestive heart failure) (HCC)    Chronic kidney disease    Depression    Gastroparesis    Hypertension    Hypotension    Intractable nausea and vomiting 11/13/2022   Migraine    Thyroid  disease    Type 1 diabetes [facility])     Past Surgical  History:  Procedure Laterality Date   BIOPSY  01/14/2021   Procedure: BIOPSY;  Surgeon: Eda Iha, MD;  Location: [facility] ENDOSCOPY;  Service: Gastroenterology;;   CENTRAL LINE INSERTION N/A 10/10/2023   Procedure: CENTRAL LINE INSERTION;  Surgeon: Marea Selinda RAMAN, MD;  Location: [facility] INVASIVE CV LAB;  Service: Cardiovascular;  Laterality: N/A;   CESAREAN SECTION     x2   CESAREAN SECTION WITH BILATERAL TUBAL LIGATION N/A 02/17/2021   Procedure: CESAREAN SECTION WITH BILATERAL TUBAL LIGATION;  Surgeon: Barbra Lang PARAS, DO;  Location: [facility] LD ORS;  Service: Obstetrics;  Laterality: N/A;   ESOPHAGOGASTRODUODENOSCOPY (EGD) WITH PROPOFOL  N/A 01/14/2021   Procedure: ESOPHAGOGASTRODUODENOSCOPY (EGD) WITH PROPOFOL ;  Surgeon: Eda Iha, MD;  Location: [facility] ENDOSCOPY;  Service: Gastroenterology;  Laterality: N/A;   IR BONE MARROW BIOPSY & ASPIRATION  12/16/2022   IR REPLC DUODEN/JEJUNO TUBE PERCUT W/FLUORO  11/22/2022   JEJUNOSTOMY N/A 06/13/2022   Procedure: DEXTER;  Surgeon: Jordis Laneta FALCON, MD;  Location: [facility] ORS;  Service: General;  Laterality: N/A;   MOUTH SURGERY     TEE WITHOUT CARDIOVERSION N/A 01/06/2024   Procedure: ECHOCARDIOGRAM, TRANSESOPHAGEAL;  Surgeon: Rolan Ezra RAMAN, MD;  Location: [facility] ORS;  Service: Cardiovascular;  Laterality: N/A;   TUBAL LIGATION       reports that she has never smoked. She has never been exposed to tobacco smoke. She has never used smokeless tobacco. She reports that she does not drink alcohol and does not use drugs.  No Known Allergies  Family History  Problem Relation Age of Onset   Breast cancer Mother    Seizures Mother    Cancer Mother    Diabetes type I Father    CAD Father    Diabetes Father    Early death Father    Lung cancer Maternal Grandfather    CAD Paternal Grandmother    Diabetes Paternal Grandmother    CAD Paternal Grandfather    Diabetes Paternal Grandfather    ADD / ADHD Daughter    Diabetes Maternal Uncle     Ovarian cancer Neg Hx      Prior to Admission medications   Medication Sig Start Date End Date Taking? Authorizing Provider  BAQSIMI ONE PACK 3 MG/DOSE POWD Place 3 mg into the nose as needed. 06/09/23  Yes [provider]  divalproex  (DEPAKOTE ) 250 MG DR tablet Take 250 mg by mouth in the morning.   Yes [provider]  hydrocortisone  (CORTEF ) 10 MG tablet Take 2 tablets (20 mg total) by mouth daily with breakfast AND 1 tablet (10 mg total) daily with supper. 05/27/23  Yes Sreenath, Sudheer B, MD  insulin  aspart (NOVOLOG ) 100 UNIT/ML FlexPen Inject 2 Units into the skin 3 (three) times daily with meals. If eating and Blood Glucose (BG) 80 or higher inject 2 units for meal coverage and add correction dose per scale. If not eating, correction dose only. BG <150= 0 unit; BG 150-200= 1 unit; BG 201-250= 3 unit; BG 251-300= 5 unit; BG 301-350= 7 unit; BG 351-400= 9 unit; BG >400= 11 unit and Call Primary Care. 10/12/23  Yes [name], MD  insulin  glargine (LANTUS ) 100 UNIT/ML Solostar Pen Inject 10 Units into the skin daily. 10/12/23  Yes Wieting, Richard, MD  levothyroxine  (SYNTHROID ) 75 MCG tablet TAKE 1 TABLET (75 MCG TOTAL) BY MOUTH DAILY AT 6 (SIX) AM. 12/06/23  Yes Ostwalt, [name], PA-C  metoprolol  succinate (TOPROL -XL) 25 MG 24 hr tablet Take 1 tablet (25 mg total) by mouth daily. 01/08/24  Yes Barbarann Nest, MD  Pancrelipase , Lip-Prot-Amyl, 24000-76000 units CPEP Take 1 capsule (24,000 Units total) by mouth 3 (three) times daily before meals. 11/04/23  Yes [name], MD  rosuvastatin  (CRESTOR ) 10 MG tablet Take 1 tablet (10 mg total) by mouth daily. 01/18/24  Yes [place] A, FNP  Accu-Chek Softclix Lancets lancets Use to check blood sugar up to 6 times daily. May substitute to any manufacturer covered by patient's insurance. 01/19/24   Ostwalt, [name], PA-C  acetaminophen  (TYLENOL ) 500 MG tablet Take 2 tablets (1,000 mg total) by mouth every 6 (six) hours as needed for  headache, fever or moderate pain. Patient not taking: Reported on 02/09/2024 08/05/22   Dorinda Drue DASEN, MD  BD PEN NEEDLE MINI ULTRAFINE 31G X 5 MM MISC by Other route. 07/18/23 07/17/24  [provider]  Blood Glucose Monitoring Suppl (BLOOD GLUCOSE MONITOR SYSTEM) w/Device KIT Use to check blood sugar up to 6 times daily. May substitute to any manufacturer covered by patient's insurance. 01/19/24   Ostwalt, [name], PA-C  Continuous Glucose Sensor (DEXCOM G7 SENSOR) MISC Use to check blood sugar. Change every 10 days 01/19/24   Ostwalt, [name], PA-C  Glucose Blood (BLOOD GLUCOSE TEST STRIPS) STRP Use to check blood sugar up to 6 times daily. May substitute to any manufacturer covered by patient's insurance. 01/19/24   Ostwalt, [name], PA-C  hydrALAZINE  (APRESOLINE ) 10 MG tablet Take 1 tablet (10 mg total) by mouth with breakfast, with lunch, and with evening meal. Patient  not taking: No sig reported 01/07/24   Barbarann Nest, MD  Insulin  Pen Needle (PEN NEEDLES) 31G X 6 MM MISC PEN NEEDLES 31G X 6 MM 05/31/17   [provider]  Lancet Device MISC Use to check blood sugar up to 6 times daily. May substitute to any manufacturer covered by patient's insurance. 01/19/24   Ostwalt, [name], PA-C  spironolactone  (ALDACTONE ) 25 MG tablet Take 0.5 tablets (12.5 mg total) by mouth daily. Patient not taking: Reported on 02/09/2024 12/26/23 03/25/24  Rolan Ezra RAMAN, MD    Physical Exam: Vitals:   02/09/24 1350 02/09/24 1351  BP: 136/80   Pulse: 84   Resp: 16   Temp: 98.9 F (37.2 C)   TempSrc: Oral   SpO2: 100%   Weight:  54 kg  Height:  5' 1 (1.549 m)    Constitutional: NAD, calm, comfortable Vitals:   02/09/24 1350 02/09/24 1351  BP: 136/80   Pulse: 84   Resp: 16   Temp: 98.9 F (37.2 C)   TempSrc: Oral   SpO2: 100%   Weight:  54 kg  Height:  5' 1 (1.549 m)   Eyes: PERRL, lids and conjunctivae normal ENMT: Mucous membranes are moist. Posterior pharynx clear of any exudate or  lesions.Normal dentition.  Neck: normal, supple, no masses, no thyromegaly Respiratory: clear to auscultation bilaterally, no wheezing, no crackles. Normal respiratory effort. No accessory muscle use.  Cardiovascular: Regular rate and rhythm, no murmurs / rubs / gallops. No extremity edema. 2+ pedal pulses. No carotid bruits.  Abdomen: no tenderness, no masses palpated. No hepatosplenomegaly. Bowel sounds positive.  Musculoskeletal: no clubbing / cyanosis. No joint deformity upper and lower extremities. Good ROM, no contractures. Normal muscle tone.  Skin: no rashes, lesions, ulcers. No induration Neurologic: CN 2-12 grossly intact. Sensation intact, DTR normal. Strength 5/5 in all 4.  Psychiatric: Normal judgment and insight. Alert and oriented x 3. Normal mood.     Labs on Admission: I have personally reviewed following labs and imaging studies  CBC: Recent Labs  Lab 02/09/24 1353  WBC 5.7  NEUTROABS 3.6  HGB 9.6*  HCT 28.9*  MCV 92.3  PLT 143*   Basic Metabolic Panel: Recent Labs  Lab 02/07/24 1557 02/09/24 1353  NA 132* 137  K 6.1* 6.1*  CL 99 103  CO2 24 24  GLUCOSE 598* 462*  BUN 62* 59*  CREATININE 2.52* 2.62*  CALCIUM  9.0 8.3*   GFR: Estimated Creatinine Clearance: 23.3 mL/min (A) (by C-G formula based on SCr of 2.62 mg/dL (H)). Liver Function Tests: Recent Labs  Lab 02/09/24 1353  AST 37  ALT 44  ALKPHOS 61  BILITOT 0.6  PROT 6.6  ALBUMIN  3.8   No results for input(s): LIPASE, AMYLASE in the last 168 hours. No results for input(s): AMMONIA in the last 168 hours. Coagulation Profile: No results for input(s): INR, PROTIME in the last 168 hours. Cardiac Enzymes: No results for input(s): CKTOTAL, CKMB, CKMBINDEX, TROPONINI in the last 168 hours. BNP (last 3 results) Recent Labs    12/21/23 1438  PROBNP >70000*   HbA1C: No results for input(s): HGBA1C in the last 72 hours. CBG: No results for input(s): GLUCAP in the last 168  hours. Lipid Profile: No results for input(s): CHOL, HDL, LDLCALC, TRIG, CHOLHDL, LDLDIRECT in the last 72 hours. Thyroid  Function Tests: No results for input(s): TSH, T4TOTAL, FREET4, T3FREE, THYROIDAB in the last 72 hours. Anemia Panel: No results for input(s): VITAMINB12, FOLATE, FERRITIN, TIBC, IRON , RETICCTPCT in  the last 72 hours. Urine analysis:    Component Value Date/Time   COLORURINE STRAW (A) 02/09/2024 1459   APPEARANCEUR CLEAR (A) 02/09/2024 1459   APPEARANCEUR Clear 12/21/2023 1438   LABSPEC 1.013 02/09/2024 1459   LABSPEC 1.026 03/01/2013 1844   PHURINE 7.0 02/09/2024 1459   GLUCOSEU >=500 (A) 02/09/2024 1459   GLUCOSEU >=500 03/01/2013 1844   HGBUR SMALL (A) 02/09/2024 1459   BILIRUBINUR NEGATIVE 02/09/2024 1459   BILIRUBINUR Negative 12/21/2023 1438   BILIRUBINUR Negative 03/01/2013 1844   KETONESUR NEGATIVE 02/09/2024 1459   PROTEINUR 100 (A) 02/09/2024 1459   UROBILINOGEN 0.2 10/23/2020 0953   NITRITE NEGATIVE 02/09/2024 1459   LEUKOCYTESUR NEGATIVE 02/09/2024 1459   LEUKOCYTESUR Negative 03/01/2013 1844    Radiological Exams on Admission: No results found.  EKG: Independently reviewed.  Sinus rhythm, tented T waves in leadV1 and V2  Assessment/Plan Principal Problem:   Hyperkalemia  (please populate well all problems here in Problem List. (For example, if patient is on BP meds at home and you resume or decide to hold them, it is a problem that needs to be her. Same for CAD, COPD, HLD and so on)  Hyperkalemia - Iatrogenic secondary to valsartan , Valartan discontinued - With T wave changes on EKG, received hyperkalemia cocktail coding calcium  gluconate, insulin , albuterol  and Lokelma  - Repeat potassium level this evening  Chronic HFrEF HTN - Went through patient BP/CHF medication list with her cardiology, who recommended we can put patient back on metoprolol  and continue hydralazine  and consider Imdur .  IDDM with  hyperglycemia - Continue Lantus  10 units daily - SSI - Add Januvia  Adrenal insufficiency - Continue hydrocortisone  20 g daily  Hypothyroidism - Continue Synthroid   DVT prophylaxis: SCD Code Status: Full code Family Communication: None at bedside Disposition Plan: Expect less than 2 midnight hospital stay Consults called: Curbside consult with cardiology Admission status: Telemetry observation   Cort ONEIDA Mana MD [facility] Pager [phone]  02/09/2024, 4:45 PM

## 2024-02-10 DIAGNOSIS — E875 Hyperkalemia: Secondary | ICD-10-CM | POA: Diagnosis not present

## 2024-02-10 LAB — BASIC METABOLIC PANEL WITH GFR
Anion gap: 6 (ref 5–15)
BUN: 57 mg/dL — ABNORMAL HIGH (ref 6–20)
CO2: 26 mmol/L (ref 22–32)
Calcium: 8.3 mg/dL — ABNORMAL LOW (ref 8.9–10.3)
Chloride: 109 mmol/L (ref 98–111)
Creatinine, Ser: 2.37 mg/dL — ABNORMAL HIGH (ref 0.44–1.00)
GFR, Estimated: 27 mL/min — ABNORMAL LOW (ref >60)
Glucose, Bld: 121 mg/dL — ABNORMAL HIGH (ref 70–99)
Potassium: 5.7 mmol/L — ABNORMAL HIGH (ref 3.5–5.1)
Sodium: 141 mmol/L (ref 135–145)

## 2024-02-10 LAB — POTASSIUM: Potassium: 5.2 mmol/L — ABNORMAL HIGH (ref 3.5–5.1)

## 2024-02-10 LAB — GLUCOSE, CAPILLARY
Glucose-Capillary: 159 mg/dL — ABNORMAL HIGH (ref 70–99)
Glucose-Capillary: 243 mg/dL — ABNORMAL HIGH (ref 70–99)

## 2024-02-10 MED ORDER — INFLUENZA VIRUS VACC SPLIT PF (FLUZONE) 0.5 ML IM SUSY: 0.5000 mL | PREFILLED_SYRINGE | INTRAMUSCULAR | Status: DC

## 2024-02-10 MED ORDER — SODIUM ZIRCONIUM CYCLOSILICATE 10 G PO PACK
10.0000 g | PACK | Freq: Once | ORAL | Status: AC
  Administered 2024-02-10: 10 g via ORAL
  Filled 2024-02-10: qty 1

## 2024-02-10 MED ORDER — CALCIUM GLUCONATE-NACL 1-0.675 GM/50ML-% IV SOLN
1.0000 g | Freq: Once | INTRAVENOUS | Status: AC
Start: 2024-02-10 — End: 2024-02-10
  Administered 2024-02-10: 1000 mg via INTRAVENOUS
  Filled 2024-02-10: qty 50

## 2024-02-10 NOTE — Inpatient Diabetes Management (Signed)
 Inpatient Diabetes Program Recommendations  AACE/ADA: New Consensus Statement on Inpatient Glycemic Control (2015)  Target Ranges:  Prepandial:   less than 140 mg/dL      Peak postprandial:   less than 180 mg/dL (1-2 hours)      Critically ill patients:  140 - 180 mg/dL    Latest Reference Range & Units 02/09/24 16:50 02/09/24 22:40 02/10/24 07:57  Glucose-Capillary 70 - 99 mg/dL  5 units Novolog  @1520  385 (H)  9 units Novolog   223 (H)  2 units Novolog   159 (H)  2 units Novolog    (H): Data is abnormally high   To ED with Hyperglycemia/ Hyperkalemia (was just admitted 8/11 thru 8/16)  History: T1DM, CKD, CHF  Home DM Meds: Lantus  10 units daily      Novolog  0-11 units TID      Novolog  2 units TID with meals     Cortef  20 mg QAM, Cortef  10 mg QPM      Dexcom G7 CGM   Current Orders: Novolog  Sensitive Correction Scale/ SSI (0-9 units) TID AC + HS     Lantus  10 units daily    Started Novolog  0-9 ac/hs last PM Starting Lantus  10 units daily this AM   MD- Not sure why Tradjenta  has been added to regimen?   Pt has History of Type 1 diabetes--Does not take Tradjenta  at home Please consider d/c of Tradjenta   Pt very sensitive to insulin  and has a tendency to drop quickly with Novolog   Will likely need some Novolog  Meal Coverage added to regimen   Please consider adding Novolog  2 units TID with meals HOLD if pt NPO HOLD if pt eats <50% meals  Please also consider reducing the Novolog  SSI to the 0-6 unit Very Sensitive scale     --Will follow patient during hospitalization--  Adina Rudolpho Arrow RN, MSN, CDCES Diabetes Coordinator Inpatient Glycemic Control Team Team Pager: [phone] (8a-5p)

## 2024-02-10 NOTE — Plan of Care (Signed)
  Problem: Fluid Volume: Goal: Ability to maintain a balanced intake and output will improve Outcome: Progressing   Problem: Coping: Goal: Ability to adjust to condition or change in health will improve Outcome: Progressing   Problem: Nutritional: Goal: Maintenance of adequate nutrition will improve Outcome: Progressing   Problem: Tissue Perfusion: Goal: Adequacy of tissue perfusion will improve Outcome: Progressing   Problem: Clinical Measurements: Goal: Ability to maintain clinical measurements within normal limits will improve Outcome: Progressing

## 2024-02-10 NOTE — Discharge Summary (Signed)
 Physician Discharge Summary  Ashley Camacho FMW:[id number] DOB: 10-09-1990 DOA: 02/09/2024  PCP: Ostwalt, [name], PA-C  Admit date: 02/09/2024 Discharge date: 02/10/2024  Admitted From: Home Disposition:  Home  Recommendations for Outpatient Follow-up:  Follow up with PCP in 1-2 weeks Follow up with the heart failure service  Home Health:No  Equipment/Devices:None   Discharge Condition:Stable  CODE STATUS:FULL  Diet recommendation: Reg  Brief/Interim Summary: 33 y.o. female with medical history significant of IDDM, CKD stage IV, diabetic gastroparesis, adrenal insufficiency, chronic HFrEF with LVEF 25% in 2023, sent from PCPs office for evaluation of worsening of hyperkalemia.   Patient has no complaints, she went to see her PCP today and blood work showed potassium level 6.1.  Patient reported that since this summer, her BP/CHF medications have been adjusted multiple times, her current regimen including losartan , and hydralazine .  Recently, metoprolol  and Aldactone  were discontinued.  There was a plan to transition from valsartan  to Entresto  later this week.  Patient potassium gradually lowered in house.  Received 3 doses of Lokelma  10 g.  Last dose of Lokelma  was given after potassium of 5.2.  EKG was repeated and minimally peaked T waves noted on admission EKG have resolved.  Patient is medically appropriate for discharge.  We briefly reviewed her home medication regimen.  She is under close monitoring with the heart failure service including heart failure pharmacy.  Patient states she has an appointment with a heart failure pharmacist 1 week from discharge.  Recommend repeat lab work within a week of discharge if possible.    Discharge Diagnoses:  Principal Problem:   Hyperkalemia   Hyperkalemia - Iatrogenic secondary to valsartan , Valartan discontinued - Associated with mild peaked T waves on admission - Received 3 doses of Lokelma  10 g - EKG changes  resolved Plan: Discharge home.  Valsartan  discontinued.  Aldactone  held.  Imdur  and hydralazine  held.  Sitagliptin discontinued.  Follow-up with heart failure pharmacy within a week of discharge.   Discharge Instructions  Discharge Instructions     Diet - low sodium heart healthy   Complete by: As directed    Increase activity slowly   Complete by: As directed       Allergies as of 02/10/2024   No Known Allergies      Medication List     PAUSE taking these medications    hydrALAZINE  10 MG tablet Wait to take this until your doctor or other care provider tells you to start again. Commonly known as: APRESOLINE  Take 1 tablet (10 mg total) by mouth with breakfast, with lunch, and with evening meal.   spironolactone  25 MG tablet Wait to take this until your doctor or other care provider tells you to start again. Commonly known as: ALDACTONE  Take 0.5 tablets (12.5 mg total) by mouth daily.       TAKE these medications    Accu-Chek Guide Test test strip Generic drug: glucose blood Use to check blood sugar up to 6 times daily. May substitute to any manufacturer covered by patient's insurance.   Accu-Chek Guide w/Device Kit Use to check blood sugar up to 6 times daily. May substitute to any manufacturer covered by patient's insurance.   Accu-Chek Softclix Lancets lancets Use to check blood sugar up to 6 times daily. May substitute to any manufacturer covered by patient's insurance.   acetaminophen  500 MG tablet Commonly known as: TYLENOL  Take 2 tablets (1,000 mg total) by mouth every 6 (six) hours as needed for headache, fever or moderate pain.  Baqsimi One Pack 3 MG/DOSE Powd Generic drug: Glucagon Place 3 mg into the nose as needed.   Creon  24000-76000 units Cpep Generic drug: Pancrelipase  (Lip-Prot-Amyl) Take 1 capsule (24,000 Units total) by mouth 3 (three) times daily before meals.   Dexcom G7 Sensor Misc Use to check blood sugar. Change every 10 days    divalproex  250 MG DR tablet Commonly known as: DEPAKOTE  Take 250 mg by mouth in the morning.   hydrocortisone  10 MG tablet Commonly known as: CORTEF  Take 2 tablets (20 mg total) by mouth daily with breakfast AND 1 tablet (10 mg total) daily with supper.   insulin  aspart 100 UNIT/ML FlexPen Commonly known as: NOVOLOG  Inject 2 Units into the skin 3 (three) times daily with meals. If eating and Blood Glucose (BG) 80 or higher inject 2 units for meal coverage and add correction dose per scale. If not eating, correction dose only. BG <150= 0 unit; BG 150-200= 1 unit; BG 201-250= 3 unit; BG 251-300= 5 unit; BG 301-350= 7 unit; BG 351-400= 9 unit; BG >400= 11 unit and Call Primary Care.   insulin  glargine 100 UNIT/ML Solostar Pen Commonly known as: LANTUS  Inject 10 Units into the skin daily.   Lancet Device Misc Use to check blood sugar up to 6 times daily. May substitute to any manufacturer covered by patient's insurance.   levothyroxine  75 MCG tablet Commonly known as: SYNTHROID  TAKE 1 TABLET (75 MCG TOTAL) BY MOUTH DAILY AT 6 (SIX) AM.   metoprolol  succinate 25 MG 24 hr tablet Commonly known as: TOPROL -XL Take 1 tablet (25 mg total) by mouth daily.   Pen Needles 31G X 6 MM Misc PEN NEEDLES 31G X 6 MM   BD Pen Needle Mini Ultrafine 31G X 5 MM Misc Generic drug: Insulin  Pen Needle by Other route.   rosuvastatin  10 MG tablet Commonly known as: CRESTOR  Take 1 tablet (10 mg total) by mouth daily.        Follow-up Information     Ostwalt, [name], PA-C. Schedule an appointment as soon as possible for a visit in 1 week(s).   Specialty: Physician Assistant Contact information: [address] [place] [address] [phone]                No Known Allergies  Consultations: None   Procedures/Studies: No results found.    Subjective: Seen and examined on the day of discharge.  Stable no distress.  Appropriate for discharge home.  Discharge  Exam: Vitals:   02/10/24 0755 02/10/24 1204  BP: (!) 147/75 129/73  Pulse: 86 82  Resp: 18 16  Temp: 98.1 F (36.7 C) 98.3 F (36.8 C)  SpO2: 100% 100%   Vitals:   02/09/24 1940 02/10/24 0530 02/10/24 0755 02/10/24 1204  BP: 116/60 (!) 153/84 (!) 147/75 129/73  Pulse: (!) 108 79 86 82  Resp: 16 16 18 16   Temp: 97.8 F (36.6 C) 98.3 F (36.8 C) 98.1 F (36.7 C) 98.3 F (36.8 C)  TempSrc:      SpO2: 100% 100% 100% 100%  Weight:      Height:        General: Pt is alert, awake, not in acute distress Cardiovascular: RRR, S1/S2 +, no rubs, no gallops Respiratory: CTA bilaterally, no wheezing, no rhonchi Abdominal: Soft, NT, ND, bowel sounds + Extremities: no edema, no cyanosis    The results of significant diagnostics from this hospitalization (including imaging, microbiology, ancillary and laboratory) are listed below for reference.  Microbiology: No results found for this or any previous visit (from the past 240 hours).   Labs: BNP (last 3 results) Recent Labs    01/02/24 1730 01/27/24 1344 02/07/24 1557  BNP >4,500.0* 1,694.6* 933.9*   Basic Metabolic Panel: Recent Labs  Lab 02/07/24 1557 02/09/24 1353 02/09/24 2048 02/10/24 0434 02/10/24 1430  NA 132* 137  --  141  --   K 6.1* 6.1* 3.9 5.7* 5.2*  CL 99 103  --  109  --   CO2 24 24  --  26  --   GLUCOSE 598* 462*  --  121*  --   BUN 62* 59*  --  57*  --   CREATININE 2.52* 2.62*  --  2.37*  --   CALCIUM  9.0 8.3*  --  8.3*  --    Liver Function Tests: Recent Labs  Lab 02/09/24 1353  AST 37  ALT 44  ALKPHOS 61  BILITOT 0.6  PROT 6.6  ALBUMIN  3.8   No results for input(s): LIPASE, AMYLASE in the last 168 hours. No results for input(s): AMMONIA in the last 168 hours. CBC: Recent Labs  Lab 02/09/24 1353  WBC 5.7  NEUTROABS 3.6  HGB 9.6*  HCT 28.9*  MCV 92.3  PLT 143*   Cardiac Enzymes: No results for input(s): CKTOTAL, CKMB, CKMBINDEX, TROPONINI in the last 168  hours. BNP: Invalid input(s): POCBNP CBG: Recent Labs  Lab 02/09/24 1650 02/09/24 2240 02/10/24 0757 02/10/24 1205  GLUCAP 385* 223* 159* 243*   D-Dimer No results for input(s): DDIMER in the last 72 hours. Hgb A1c No results for input(s): HGBA1C in the last 72 hours. Lipid Profile No results for input(s): CHOL, HDL, LDLCALC, TRIG, CHOLHDL, LDLDIRECT in the last 72 hours. Thyroid  function studies No results for input(s): TSH, T4TOTAL, T3FREE, THYROIDAB in the last 72 hours.  Invalid input(s): FREET3 Anemia work up No results for input(s): VITAMINB12, FOLATE, FERRITIN, TIBC, IRON , RETICCTPCT in the last 72 hours. Urinalysis    Component Value Date/Time   COLORURINE STRAW (A) 02/09/2024 1459   APPEARANCEUR CLEAR (A) 02/09/2024 1459   APPEARANCEUR Clear 12/21/2023 1438   LABSPEC 1.013 02/09/2024 1459   LABSPEC 1.026 03/01/2013 1844   PHURINE 7.0 02/09/2024 1459   GLUCOSEU >=500 (A) 02/09/2024 1459   GLUCOSEU >=500 03/01/2013 1844   HGBUR SMALL (A) 02/09/2024 1459   BILIRUBINUR NEGATIVE 02/09/2024 1459   BILIRUBINUR Negative 12/21/2023 1438   BILIRUBINUR Negative 03/01/2013 1844   KETONESUR NEGATIVE 02/09/2024 1459   PROTEINUR 100 (A) 02/09/2024 1459   UROBILINOGEN 0.2 10/23/2020 0953   NITRITE NEGATIVE 02/09/2024 1459   LEUKOCYTESUR NEGATIVE 02/09/2024 1459   LEUKOCYTESUR Negative 03/01/2013 1844   Sepsis Labs Recent Labs  Lab 02/09/24 1353  WBC 5.7   Microbiology No results found for this or any previous visit (from the past 240 hours).   Time coordinating discharge: 40 minutes  SIGNED:   Calvin KATHEE Robson, MD  [facility] 02/10/2024, 4:08 PM Pager   If 7PM-7AM, please contact night-coverage

## 2024-02-13 NOTE — Telephone Encounter (Signed)
 Brief Telephone Documentation Reason for Call: Appointment scheduline  Summary of Call: Called patient to reschedule pharmacy appointment. Left message with direct call back number   Ashley Camacho, PharmD Clinical Pharmacist [facility] [phone]

## 2024-02-16 ENCOUNTER — Telehealth: Payer: Self-pay | Admitting: Family

## 2024-02-16 NOTE — Telephone Encounter (Signed)
 Called to confirm/remind patient of their appointment at the [facility] on 02/17/24.   Appointment:   [] Confirmed  [x] Left mess   [] No answer/No voice mail  [] VM Full/unable to leave message  [] Phone not in service  Patient reminded to bring all medications and/or complete list.  Confirmed patient has transportation. Gave directions, instructed to utilize valet parking.

## 2024-02-16 NOTE — Progress Notes (Unsigned)
 [facility] Note   Referring Physician: PCP: Dineen Channel, PA-C PCP-Cardiologist: Redell Cave, MD   Chief Complaint:  HPI:     PCP: Dineen Channel [facility] Cardiology: Dr. Rolan  Chief complaint: CHF  33 y.o. with history of type 1 diabetes, CKD stage 3/diabetic nephropathy, diabetic gastroparesis, adrenal insufficiency, and CHF was referred by Dr. Claudene in the [facility] ER for evaluation of CHF.  She has a rather complex history.  Echo in 7/22 after the birth of her second daughter showed EF 40-45%.  This had declined to 25% by 2/23, ?peripartum CMP.  Cath in 2/23 at [facility] showed no significant CAD.  She had AKI in 3/23 requiring transient dialysis, possible acute interstitial nephritis.  She also had Enterococcal bacteremia that admission in 3/23. She has numerous diabetic complications including gastroparesis, nephropathy, and neuropathy.    She was hospitalized at [facility] in 6/25 for possible PNA.  She was seen in the ER 12/22/23 with dyspnea and LE edema.  She says the edema had been present since 6/25 admission and she think she got too much IVF in the hospital. LE US  showed not DVT.  CXR showed vascular congestion.  BNP was severely elevated.  Interestingly, patient had had an echo in 6/25 at [facility] that I reviewed; this was fairly normal with EF 55-60%, GLS -23.4%, normal diastolic function, RV normal, mild-moderate MR, IVC normal.  Echo did not match clinical picture.  Patient was started on Lasix  20 mg daily when she was sent home from the ER.   Patient was admitted in 8/25 with CHF.  She was diuresed with Lasix  gtt, developed AKI with creatinine up to 3.15. Cardiac MRI showed mild LV dilation with LV EF 35%, RV EF 34%, no delayed enhancement, and possible severe MR with regurgitant fraction 45%.  TEE was done to reassess mitral regurgitation after diuresis, this showed EF 35-40%, only trivial mitral regurgitation and normal IVC.   She returns today for followup of  CHF.  She is taking Lasix  20 mg daily.  She was started on valsartan  by NP in this office a few weeks ago. Weight is down 12 lbs.  No dyspnea with usual activities/ADLs.  Mild dyspnea with stairs.  No chest pain.  Occasional palpitations.  No orthopnea/PND.  No lightheadedness.  She is not taking Toprol  XL.  BP is stable.   ECG (personally reviewed): NSR, anterolateral TWIs  Labs (7/25): HS-TnI 19 => 20, BNP 4236, K 5.2, creatinine 1.82 Labs (8/25): Creatinine 3.1, BNP >4500 Labs (9/25): K 5.3, creatinine 1.86  PMH: 1. Type 1 diabetes with h/o DKA.  2. Diabetic gastroparesis: Severe  3. Adrenal insufficiency: She is on hydrocortisone  4. CKD stage 3: Diabetic nephropathy.  5. Chronic HF with recovered EF: H/o nonischemic cardiomyopathy.  - Echo (7/22): EF 40-45% => ?peri-partum cardiomyopathy.  - Echo (2/23): EF 25% - LHC (2/23, [facility]): no significant CAD.  - Echo (3/23): EF 20% - Echo (10/23): EF 30-35% ([facility]) - Echo (6/25): EF 55-60%, GLS -23.4%, normal diastolic function, RV normal, mild-moderate MR, IVC normal - Cardiac MRI (8/25): mild LV dilation with LV EF 35%, RV EF 34%, no delayed enhancement, and possible severe MR with regurgitant fraction 45%. - TEE (8/25): EF 35-40%, only trivial mitral regurgitation and normal IVC.  6. Diabetic neuropathy.  7. PEA arrest after EGD in 3/23.  8. AKI in 3/23 from possible acute interstitial nephritis, required transient HD.  9. Enterococcal bacteremia in 3/23.  10. RUE DVT in 2022.  SH: Lives with roommate in [place], 2 daughters, nonsmoker, no ETOH.  Not working.   FH: Father with type 1 diabetes, CAD with stent around age 58, died with cardiac arrest around age 11. Sister with SVT.   ROS: All systems reviewed and negative except as per HPI.     Past Medical History:  Diagnosis Date   Acute metabolic encephalopathy 06/10/2022   Adrenal insufficiency    Anemia    Anxiety    Blood transfusion without reported diagnosis    CHF  (congestive heart failure) (HCC)    Chronic kidney disease    Depression    Gastroparesis    Hypertension    Hypotension    Intractable nausea and vomiting 11/13/2022   Migraine    Thyroid  disease    Type 1 diabetes (HCC)     Current Outpatient Medications  Medication Sig Dispense Refill   Accu-Chek Softclix Lancets lancets Use to check blood sugar up to 6 times daily. May substitute to any manufacturer covered by patient's insurance. 100 each 5   acetaminophen  (TYLENOL ) 500 MG tablet Take 2 tablets (1,000 mg total) by mouth every 6 (six) hours as needed for headache, fever or moderate pain. (Patient not taking: Reported on 02/09/2024) 30 tablet 3   BAQSIMI ONE PACK 3 MG/DOSE POWD Place 3 mg into the nose as needed.     BD PEN NEEDLE MINI ULTRAFINE 31G X 5 MM MISC by Other route.     Blood Glucose Monitoring Suppl (BLOOD GLUCOSE MONITOR SYSTEM) w/Device KIT Use to check blood sugar up to 6 times daily. May substitute to any manufacturer covered by patient's insurance. 1 kit 0   Continuous Glucose Sensor (DEXCOM G7 SENSOR) MISC Use to check blood sugar. Change every 10 days 3 each 3   divalproex  (DEPAKOTE ) 250 MG DR tablet Take 250 mg by mouth in the morning.     Glucose Blood (BLOOD GLUCOSE TEST STRIPS) STRP Use to check blood sugar up to 6 times daily. May substitute to any manufacturer covered by patient's insurance. 100 strip 5   [Paused] hydrALAZINE  (APRESOLINE ) 10 MG tablet Take 1 tablet (10 mg total) by mouth with breakfast, with lunch, and with evening meal. (Patient not taking: No sig reported) 90 tablet 0   hydrocortisone  (CORTEF ) 10 MG tablet Take 2 tablets (20 mg total) by mouth daily with breakfast AND 1 tablet (10 mg total) daily with supper. 90 tablet 0   insulin  aspart (NOVOLOG ) 100 UNIT/ML FlexPen Inject 2 Units into the skin 3 (three) times daily with meals. If eating and Blood Glucose (BG) 80 or higher inject 2 units for meal coverage and add correction dose per scale. If not  eating, correction dose only. BG <150= 0 unit; BG 150-200= 1 unit; BG 201-250= 3 unit; BG 251-300= 5 unit; BG 301-350= 7 unit; BG 351-400= 9 unit; BG >400= 11 unit and Call Primary Care.     insulin  glargine (LANTUS ) 100 UNIT/ML Solostar Pen Inject 10 Units into the skin daily.     Insulin  Pen Needle (PEN NEEDLES) 31G X 6 MM MISC PEN NEEDLES 31G X 6 MM     Lancet Device MISC Use to check blood sugar up to 6 times daily. May substitute to any manufacturer covered by patient's insurance. 1 each 1   levothyroxine  (SYNTHROID ) 75 MCG tablet TAKE 1 TABLET (75 MCG TOTAL) BY MOUTH DAILY AT 6 (SIX) AM. 90 tablet 0   metoprolol  succinate (TOPROL -XL) 25 MG 24 hr tablet Take 1  tablet (25 mg total) by mouth daily. 30 tablet 0   Pancrelipase , Lip-Prot-Amyl, 24000-76000 units CPEP Take 1 capsule (24,000 Units total) by mouth 3 (three) times daily before meals. 270 capsule 0   rosuvastatin  (CRESTOR ) 10 MG tablet Take 1 tablet (10 mg total) by mouth daily. 30 tablet 11   [Paused] spironolactone  (ALDACTONE ) 25 MG tablet Take 0.5 tablets (12.5 mg total) by mouth daily. (Patient not taking: Reported on 02/09/2024) 45 tablet 3   No current facility-administered medications for this visit.    No Known Allergies    Social History   Socioeconomic History   Marital status: Single    Spouse name: Not on file   Number of children: Not on file   Years of education: Not on file   Highest education level: 9th grade  Occupational History   Occupation: home maker  Tobacco Use   Smoking status: Never    Passive exposure: Never   Smokeless tobacco: Never  Vaping Use   Vaping status: Never Used  Substance and Sexual Activity   Alcohol use: No   Drug use: No   Sexual activity: Not Currently    Birth control/protection: None  Other Topics Concern   Not on file  Social History Narrative   Not on file   Social Drivers of Health   Financial Resource Strain: Medium Risk (01/18/2024)   Overall Financial Resource  Strain (CARDIA)    Difficulty of Paying Living Expenses: Somewhat hard  Food Insecurity: No Food Insecurity (02/09/2024)   Hunger Vital Sign    Worried About Running Out of Food in the Last Year: Never true    Ran Out of Food in the Last Year: Never true  Recent Concern: Food Insecurity - Food Insecurity Present (01/18/2024)   Hunger Vital Sign    Worried About Running Out of Food in the Last Year: Sometimes true    Ran Out of Food in the Last Year: Sometimes true  Transportation Needs: No Transportation Needs (02/09/2024)   PRAPARE - Administrator, Civil Service (Medical): No    Lack of Transportation (Non-Medical): No  Physical Activity: Insufficiently Active (01/18/2024)   Exercise Vital Sign    Days of Exercise per Week: 3 days    Minutes of Exercise per Session: 30 min  Stress: Stress Concern Present (01/18/2024)   [employer] of Occupational Health - Occupational Stress Questionnaire    Feeling of Stress: Rather much  Social Connections: Socially Isolated (02/09/2024)   Social Connection and Isolation Panel    Frequency of Communication with Friends and Family: Once a week    Frequency of Social Gatherings with Friends and Family: Once a week    Attends Religious Services: Never    Database administrator or Organizations: No    Attends Banker Meetings: Never    Marital Status: Never married  Intimate Partner Violence: Not At Risk (02/09/2024)   Humiliation, Afraid, Rape, and Kick questionnaire    Fear of Current or Ex-Partner: No    Emotionally Abused: No    Physically Abused: No    Sexually Abused: No      Family History  Problem Relation Age of Onset   Breast cancer Mother    Seizures Mother    Cancer Mother    Diabetes type I Father    CAD Father    Diabetes Father    Early death Father    Lung cancer Maternal Grandfather    CAD Paternal Grandmother  Diabetes Paternal Grandmother    CAD Paternal Grandfather    Diabetes Paternal  Grandfather    ADD / ADHD Daughter    Diabetes Maternal Uncle    Ovarian cancer Neg Hx     There were no vitals filed for this visit.   PHYSICAL EXAM: General:  Well appearing. No respiratory difficulty HEENT: normal Neck: supple. no JVD. Carotids 2+ bilat; no bruits. No lymphadenopathy or thyromegaly appreciated. Cor: PMI nondisplaced. Regular rate & rhythm. No rubs, gallops or murmurs. Lungs: clear Abdomen: soft, nontender, nondistended. No hepatosplenomegaly. No bruits or masses. Good bowel sounds. Extremities: no cyanosis, clubbing, rash, edema Neuro: alert & oriented x 3, cranial nerves grossly intact. moves all 4 extremities w/o difficulty. Affect pleasant.  ECG:   ASSESSMENT & PLAN:   1. Chronic systolic CHF: Patient has history of nonischemic cardiomyopathy that was primarily followed at [facility] in the past.  There was concern that this could be a peri-partum cardiomyopathy.  Echo in 7/22 after the birth of her second daughter showed EF 40-45%.  This had declined to 25% by 2/23, ?peripartum CMP.  Cath in 2/23 at [facility] showed no significant CAD.  However, echo in 6/25 at [facility] was reviewed and noted to be relatively normal except for mild to moderate MR => EF 55-60%, GLS -23.4%, normal diastolic function, RV normal, mild-moderate MR, IVC normal.  Despite the unremarkable echo in 6/25, she struggled with volume overload and was admitted in 8/25 with CHF.  Cardiac MRI in 8/25 showed moderately depressed LV EF 35% and RV EF 34%, no LGE/scarring noted, mitral regurgitation looked severe.  This was a significant change from 6/25 echo.  I think coronary disease is unlikely with no LGE on cMRI and normal cath in 2023.  Would avoid coronary angiography with elevated creatinine.  Possible diabetic cardiomyopathy. TEE was done after diuresis to assess MR, this showed EF 35-40%, mild RV dysfunction, and only trivial mitral regurgitation. She developed AKI with diuresis during 8/25 admission, but most  recent creatinine back down to 1.86. NYHA class II currently, not volume overloaded on exam.  BP stable.  - She can continue Lasix  20 mg daily.  - She is off hydralazine  so can stop Imdur .  - Restart Toprol  XL 25 mg daily.  - With BP 131/81 and valsartan  use, will transition to Entresto .  Stop valsartan , start Entresto  24/26 bid with BMET/BNP today and BMET in 10 days.  - She is not a candidate for SGLT2 inhibitor with type 1 diabetes.  2. Hyperlipidemia: continue crestor  10 mg daily.  3. CKD stage 3: Diabetic nephropathy.  Creatinine baseline around 2, up to 3.15 with elevated BUN with diuresis in 8/25.  Now back down to 1.86 when checked earlier in 9/25.  - Follow closely, BMET today.  4. Type 1 diabetes: Poor control.  Needs close followup.  5. HTN: BP stable, med adjustment as above.   Followup with HF pharmacist in 2 wks for med titration, see me in 1 month.   I spent 32 minutes reviewing records, interviewing/examining patient, and managing orders.   Ezra Shuck 02/07/2024  Ellouise DELENA Class, FNP 02/16/24

## 2024-02-17 ENCOUNTER — Encounter: Payer: Self-pay | Admitting: Family

## 2024-02-17 ENCOUNTER — Other Ambulatory Visit
Admission: RE | Admit: 2024-02-17 | Discharge: 2024-02-17 | Disposition: A | Discharge status: Home / Self Care | Source: Ambulatory Visit | Attending: Family | Admitting: Family

## 2024-02-17 ENCOUNTER — Ambulatory Visit: Payer: Self-pay | Admitting: Family

## 2024-02-17 ENCOUNTER — Ambulatory Visit: Admitting: Family

## 2024-02-17 VITALS — BP 115/79 | HR 81 | Wt 113.0 lb

## 2024-02-17 DIAGNOSIS — I10 Essential (primary) hypertension: Secondary | ICD-10-CM

## 2024-02-17 DIAGNOSIS — N184 Chronic kidney disease, stage 4 (severe): Secondary | ICD-10-CM

## 2024-02-17 DIAGNOSIS — E782 Mixed hyperlipidemia: Secondary | ICD-10-CM

## 2024-02-17 DIAGNOSIS — I5042 Chronic combined systolic (congestive) and diastolic (congestive) heart failure: Secondary | ICD-10-CM | POA: Insufficient documentation

## 2024-02-17 DIAGNOSIS — E108 Type 1 diabetes mellitus with unspecified complications: Secondary | ICD-10-CM

## 2024-02-17 LAB — BASIC METABOLIC PANEL WITH GFR
Anion gap: 11 (ref 5–15)
BUN: 74 mg/dL — ABNORMAL HIGH (ref 6–20)
CO2: 21 mmol/L — ABNORMAL LOW (ref 22–32)
Calcium: 8.8 mg/dL — ABNORMAL LOW (ref 8.9–10.3)
Chloride: 106 mmol/L (ref 98–111)
Creatinine, Ser: 2.65 mg/dL — ABNORMAL HIGH (ref 0.44–1.00)
GFR, Estimated: 24 mL/min — ABNORMAL LOW (ref >60)
Glucose, Bld: 171 mg/dL — ABNORMAL HIGH (ref 70–99)
Potassium: 5.4 mmol/L — ABNORMAL HIGH (ref 3.5–5.1)
Sodium: 138 mmol/L (ref 135–145)

## 2024-02-17 MED ORDER — ISOSORBIDE MONONITRATE ER 30 MG PO TB24: 15.0000 mg | ORAL_TABLET | Freq: Every day | ORAL | 3 refills | Status: DC

## 2024-02-17 MED ORDER — FUROSEMIDE 20 MG PO TABS: 20.0000 mg | ORAL_TABLET | Freq: Every day | ORAL | 3 refills | Status: AC | PRN

## 2024-02-17 MED ORDER — LOKELMA 10 G PO PACK: 10.0000 g | PACK | Freq: Every day | ORAL | 0 refills | Status: AC

## 2024-02-17 NOTE — Patient Instructions (Signed)
 Medication Changes:  START Isosorbide  15,g (1/2 tab) daily  START Furosemide  20mg  (1 tab) daily as needed  HOLD Valsartan  and Spironolactone  until you return for your next visit.   Lab Work:  Go downstairs to [place] on LOWER LEVEL to have your blood work completed.  We will only call you if the results are abnormal or if the provider would like to make medication changes.  No news is good news.   Follow-Up in: Please keep follow up appointment with the [facility].   Thank you for choosing [facility].    At the [facility], you and your health needs are our priority. We have a designated team specialized in the treatment of Heart Failure. This Care Team includes your primary Heart Failure Specialized Cardiologist (physician), Advanced Practice Providers (APPs- Physician Assistants and Nurse Practitioners), and Pharmacist who all work together to provide you with the care you need, when you need it.   You may see any of the following providers on your designated Care Team at your next follow up:  Dr. Toribio Fuel Dr. Ezra Shuck Dr. Ria Commander Dr. Morene Brownie Ellouise Class, FNP Jaun Bash, RPH-CPP  Please be sure to bring in all your medications bottles to every appointment.   Need to Contact Us :  If you have any questions or concerns before your next appointment please send us  a message through [place] or call our office at [phone].    TO LEAVE A MESSAGE FOR THE NURSE SELECT OPTION 2, PLEASE LEAVE A MESSAGE INCLUDING: YOUR NAME DATE OF BIRTH CALL BACK NUMBER REASON FOR CALL**this is important as we prioritize the call backs  YOU WILL RECEIVE A CALL BACK THE SAME DAY AS LONG AS YOU CALL BEFORE 4:00 PM

## 2024-02-20 NOTE — Telephone Encounter (Signed)
 Brief Telephone Documentation Reason for Call: Appointment rescheduling  Summary of Call: Called patient to reschedule pharmacy appointment. Left voicemail with direct phone number.  Patient is scheduled for follow up with endocrinology on 02/27/24  Ulani Degrasse E. Marsh, PharmD Clinical Pharmacist [facility] [phone]

## 2024-02-20 NOTE — Progress Notes (Deleted)
 Advanced [facility] Failure Clinic Note  PCP: Dineen Channel, PA-C PCP-Cardiologist: Redell Cave, MD HF-Cardiologist: Ezra Shuck, MD  HPI:  33 y.o. with history of type 1 diabetes, CKD stage 3/diabetic nephropathy, diabetic gastroparesis, adrenal insufficiency, and CHF was referred by Dr. Claudene in the [facility] ER for evaluation of CHF.  She has a rather complex history.  Echo in 7/22 after the birth of her second daughter showed EF 40-45%.  This had declined to 25% by 2/23, ?peripartum CMP.  Cath in 2/23 at [facility] showed no significant CAD.  She had AKI in 3/23 requiring transient dialysis, possible acute interstitial nephritis.  She also had Enterococcal bacteremia that admission in 3/23. She has numerous diabetic complications including gastroparesis, nephropathy, and neuropathy.     She was hospitalized at [facility] in 6/25 for possible PNA.  She was seen in the ER 12/22/23 with dyspnea and LE edema.  She says the edema had been present since 6/25 admission and she think she got too much IVF in the hospital. LE US  showed not DVT.  CXR showed vascular congestion.  BNP was severely elevated.  Interestingly, patient had had an echo in 6/25 at [facility] that I reviewed; this was fairly normal with EF 55-60%, GLS -23.4%, normal diastolic function, RV normal, mild-moderate MR, IVC normal.  Echo did not match clinical picture.  Patient was started on Lasix  20 mg daily when she was sent home from the ER.    Patient was admitted in 8/25 with CHF.  She was diuresed with Lasix  gtt, developed AKI with creatinine up to 3.15. Cardiac MRI showed mild LV dilation with LV EF 35%, RV EF 34%, no delayed enhancement, and possible severe MR with regurgitant fraction 45%.  TEE was done to reassess mitral regurgitation after diuresis, this showed EF 35-40%, only trivial mitral regurgitation and normal IVC.    Last seen by Dr. Shuck on x where metoprolol  succinate was restarted and hydralazine /isosorbide  was changed to  Entresto .  Today Ashley Camacho returns to Heart Failure Clinic for pharmacist medication titration. Reports feeling ***. {Reports/Denies:210917258} {ACTIONS;DENIES/REPORTS:21021675::Denies} being able to complete all activities of daily living (ADLs). Is *** active throughout the day. Weight at home is *** pounds. Takes {CHL AMB AHFC Medications:210917260} ***. Appetite ***. {Does Follow/Does Not Follow:210917261} a low sodium diet.  Current Heart Failure Medications: Loop diuretic: Beta-Blocker: ACEI/ARB/ARNI: MRA: SGLT2i: Other:  Has the patient been experiencing any side effects to the medications prescribed? {yes/no:20286}  Does the patient have any problems obtaining medications due to transportation or finances? {yes/no:20286}  Understanding of regimen: {CHL AMB AHFC Excellent/Good/Fair/Poor:210917262}  Understanding of indications: {CHL AMB AHFC Excellent/Good/Fair/Poor:210917262}  Potential of adherence: {CHL AMB AHFC Excellent/Good/Fair/Poor:210917262}  Patient understands to avoid NSAIDs.  Patient understands to avoid decongestants.  Pertinent Lab Values: Creatinine  Date Value Ref Range Status  03/01/2013 0.76 0.60 - 1.30 mg/dL Final   Creatinine, Ser  Date Value Ref Range Status  02/17/2024 2.65 (H) 0.44 - 1.00 mg/dL Final   BUN  Date Value Ref Range Status  02/17/2024 74 (H) 6 - 20 mg/dL Final  92/69/7974 35 (H) 6 - 20 mg/dL Final  89/90/7985 13 7 - 18 mg/dL Final   Potassium  Date Value Ref Range Status  02/17/2024 5.4 (H) 3.5 - 5.1 mmol/L Final  03/01/2013 4.1 3.5 - 5.1 mmol/L Final   Sodium  Date Value Ref Range Status  02/17/2024 138 135 - 145 mmol/L Final  12/21/2023 145 (H) 134 - 144 mmol/L Final  03/01/2013 133 (L)  136 - 145 mmol/L Final   B Natriuretic Peptide  Date Value Ref Range Status  02/07/2024 933.9 (H) 0.0 - 100.0 pg/mL Final    Comment:    Performed at [facility], [address] [place], [place] [address]    Magnesium   Date Value Ref Range Status  01/04/2024 1.8 1.7 - 2.4 mg/dL Final    Comment:    Performed at [facility], [address] [place], [place] [address]   TSH  Date Value Ref Range Status  01/27/2024 1.435 0.350 - 4.500 uIU/mL Final    Comment:    Performed by a 3rd Generation assay with a functional sensitivity of <=0.01 uIU/mL. Performed at [facility], [address] [place], [place] [address]   12/21/2023 4.330 0.450 - 4.500 uIU/mL Final   PMH: 1. Type 1 diabetes with h/o DKA.  2. Diabetic gastroparesis: Severe  3. Adrenal insufficiency: She is on hydrocortisone  4. CKD stage 3: Diabetic nephropathy.  5. Chronic HF with recovered EF: H/o nonischemic cardiomyopathy.  - Echo (7/22): EF 40-45% => ?peri-partum cardiomyopathy.  - Echo (2/23): EF 25% - LHC (2/23, [facility]): no significant CAD.  - Echo (3/23): EF 20% - Echo (10/23): EF 30-35% ([facility]) - Echo (6/25): EF 55-60%, GLS -23.4%, normal diastolic function, RV normal, mild-moderate MR, IVC normal - Cardiac MRI (8/25): mild LV dilation with LV EF 35%, RV EF 34%, no delayed enhancement, and possible severe MR with regurgitant fraction 45%. - TEE (8/25): EF 35-40%, only trivial mitral regurgitation and normal IVC.  6. Diabetic neuropathy.  7. PEA arrest after EGD in 3/23.  8. AKI in 3/23 from possible acute interstitial nephritis, required transient HD.  9. Enterococcal bacteremia in 3/23.  10. RUE DVT in 2022.   Vital Signs: There were no vitals filed for this visit.  Assessment/Plan: 1. Chronic systolic CHF: Patient has history of nonischemic cardiomyopathy that was primarily followed at [facility] in the past.  There was concern that this could be a peri-partum cardiomyopathy.  Echo in 11/2020 after the birth of her second daughter showed EF 40-45%.  This had declined to 25% by 06/2021, ?peripartum CMP.  Cath in 06/2021 at [facility] showed no significant CAD.  However, echo in 10/2023 at [facility] was reviewed and  noted to be relatively normal except for mild to moderate MR => EF 55-60%, GLS -23.4%, normal diastolic function, RV normal, mild-moderate MR, IVC normal.  Despite the unremarkable echo in 10/2023, she struggled with volume overload and was admitted in 12/2023 with CHF.  Cardiac MRI in 12/2023 showed moderately depressed LV EF 35% and RV EF 34%, no LGE/scarring noted, mitral regurgitation looked severe.  This was a significant change from 10/2023 echo.  I think coronary disease is unlikely with no LGE on cMRI and normal cath in 2023.  Would avoid coronary angiography with elevated creatinine.  Possible diabetic cardiomyopathy. TEE was done after diuresis to assess MR, this showed EF 35-40%, mild RV dysfunction, and only trivial mitral regurgitation. She developed AKI with diuresis during 12/2023 admission, but most recent creatinine back down to 1.86. NYHA class II currently, not volume overloaded on exam.  BP stable.  - She can continue Lasix  20 mg daily.  - She is off hydralazine  so can stop Imdur .  - Restart Toprol  XL 25 mg daily.  - With BP 131/81 and valsartan  use, will transition to Entresto .  Stop valsartan , start Entresto  24/26 bid with BMET/BNP today and BMET in 10 days.  - She  is not a candidate for SGLT2 inhibitor with type 1 diabetes.  2. Hyperlipidemia: continue crestor  10 mg daily.  3. CKD stage 3: Diabetic nephropathy.  Creatinine baseline around 2, up to 3.15 with elevated BUN with diuresis in 8/25.  Now back down to 1.86 when checked earlier in 9/25.  - Follow closely, BMET today.  4. Type 1 diabetes: Poor control.  Needs close followup.  5. HTN: BP stable, med adjustment as above.    Followup with HF pharmacist in 2 wks for med titration, see me in 1 month.   Follow up: ***  ***

## 2024-02-22 ENCOUNTER — Other Ambulatory Visit

## 2024-02-27 ENCOUNTER — Telehealth: Payer: Self-pay | Admitting: Pharmacist

## 2024-02-27 DIAGNOSIS — N184 Chronic kidney disease, stage 4 (severe): Secondary | ICD-10-CM | POA: Diagnosis not present

## 2024-02-27 DIAGNOSIS — R809 Proteinuria, unspecified: Secondary | ICD-10-CM | POA: Diagnosis not present

## 2024-02-27 DIAGNOSIS — N2581 Secondary hyperparathyroidism of renal origin: Secondary | ICD-10-CM | POA: Diagnosis not present

## 2024-02-27 DIAGNOSIS — I5022 Chronic systolic (congestive) heart failure: Secondary | ICD-10-CM | POA: Diagnosis not present

## 2024-02-27 DIAGNOSIS — E2740 Unspecified adrenocortical insufficiency: Secondary | ICD-10-CM | POA: Diagnosis not present

## 2024-02-27 DIAGNOSIS — E1042 Type 1 diabetes mellitus with diabetic polyneuropathy: Secondary | ICD-10-CM | POA: Diagnosis not present

## 2024-02-27 DIAGNOSIS — E310 Autoimmune polyglandular failure: Secondary | ICD-10-CM | POA: Diagnosis not present

## 2024-02-27 DIAGNOSIS — D631 Anemia in chronic kidney disease: Secondary | ICD-10-CM | POA: Diagnosis not present

## 2024-02-27 DIAGNOSIS — E063 Autoimmune thyroiditis: Secondary | ICD-10-CM | POA: Diagnosis not present

## 2024-02-27 DIAGNOSIS — E1022 Type 1 diabetes mellitus with diabetic chronic kidney disease: Secondary | ICD-10-CM | POA: Diagnosis not present

## 2024-02-27 LAB — PROTEIN / CREATININE RATIO, URINE: Creatinine, Urine: 147

## 2024-02-27 LAB — MICROALBUMIN, URINE: Microalb, Ur: 111.6

## 2024-02-27 NOTE — Telephone Encounter (Signed)
 Called to confirm/remind patient of their appointment at the [facility] on 02/28/24.   Appointment:   [] Confirmed  [x] Left mess   [] No answer/No voice mail  [] VM Full/unable to leave message  [] Phone not in service  Patient reminded to bring all medications and/or complete list.  Confirmed patient has transportation. Gave directions, instructed to utilize valet parking.

## 2024-02-27 NOTE — Progress Notes (Addendum)
 Advanced Heart Failure Clinic Note  PCP: Dineen Channel, PA-C PCP-Cardiologist: Redell Cave, MD HF-Cardiologist: Ezra Shuck, MD  HPI:  33 y.o. with history of type 1 diabetes, CKD stage 3/diabetic nephropathy, diabetic gastroparesis, adrenal insufficiency, and CHF was referred by Dr. Claudene in the [facility] ER for evaluation of CHF.  She has a rather complex history.  Echo in 11/2020 after the birth of her second daughter showed EF 40-45%. This had declined to 25% by 06/2021, ?peripartum CMP.  Cath in 06/2021 at [facility] showed no significant CAD.  She had AKI in 07/2021 requiring transient dialysis, possible acute interstitial nephritis.  She also had Enterococcal bacteremia that admission in 07/2021. She has numerous diabetic complications including gastroparesis, nephropathy, and neuropathy.     She was hospitalized at [facility] in 10/2023 for possible PNA.  She was seen in the ER 12/22/23 with dyspnea and LE edema.  She says the edema had been present since 10/2023 admission and she think she got too much IVF in the hospital. LE US  showed not DVT.  CXR showed vascular congestion.  BNP was severely elevated.  Interestingly, patient had had an echo in 6/25 at [facility] that I reviewed; this was fairly normal with EF 55-60%, GLS -23.4%, normal diastolic function, RV normal, mild-moderate MR, IVC normal.  Echo did not match clinical picture.  Patient was started on Lasix  20 mg daily when she was sent home from the ER.    Patient was admitted in 12/2023 with CHF.  She was diuresed with Lasix  gtt, developed AKI with creatinine up to 3.15. Cardiac MRI showed mild LV dilation with LV EF 35%, RV EF 34%, no delayed enhancement, and possible severe MR with regurgitant fraction 45%.  TEE was done to reassess mitral regurgitation after diuresis, this showed EF 35-40%, only trivial mitral regurgitation and normal IVC.    Interval history: Metoprolol  has recently been restarted and tolerated well. Spironolactone  was held and the  patient was continued on valsartan  with two doses of Lokelma . Recently seen by nephrology where Veltassa samples were given, however, the patient can not tolerate the taste and prefers to swap back to Lokelma . She never starrted taking Entresto .   Today Ashley Camacho returns to Heart Failure Clinic for pharmacist medication titration. Reports feeling good. Reports occasional dizziness and lightheadedness when standing too long. Denies fatigue, chest pain, palpitations, SOB, LEE, orthopnea, orthostasis, PND. Reports being able to complete all activities of daily living (ADLs). Is active throughout the day. Weight at home is ~114 pounds, though she does not check regularly. Takes furosemide  prn. Appetite is good. Does follow a low sodium diet.  Current Heart Failure Medications: Loop diuretic: furosemide  20 mg prn (none in months) Beta-Blocker: metoprolol  succinate 25 mg daily ACEI/ARB/ARNI: valsartan  40 mg daily MRA: none SGLT2i: none Other: none  Has the patient been experiencing any side effects to the medications prescribed? Yes. Hyperkalemia with spironolactone .  Does the patient have any problems obtaining medications due to transportation or finances? No  Understanding of regimen: Excellent  Understanding of indications: Good  Potential of adherence: Good  Patient understands to avoid NSAIDs.  Patient understands to avoid decongestants.  Pertinent Lab Values: Creatinine  Date Value Ref Range Status  03/01/2013 0.76 0.60 - 1.30 mg/dL Final   Creatinine, Ser  Date Value Ref Range Status  02/17/2024 2.65 (H) 0.44 - 1.00 mg/dL Final   BUN  Date Value Ref Range Status  02/17/2024 74 (H) 6 - 20 mg/dL Final  92/69/7974 35 (H) 6 -  20 mg/dL Final  89/90/7985 13 7 - 18 mg/dL Final   Potassium  Date Value Ref Range Status  02/17/2024 5.4 (H) 3.5 - 5.1 mmol/L Final  03/01/2013 4.1 3.5 - 5.1 mmol/L Final   Sodium  Date Value Ref Range Status  02/17/2024 138 135 - 145 mmol/L  Final  12/21/2023 145 (H) 134 - 144 mmol/L Final  03/01/2013 133 (L) 136 - 145 mmol/L Final   B Natriuretic Peptide  Date Value Ref Range Status  02/07/2024 933.9 (H) 0.0 - 100.0 pg/mL Final    Comment:    Performed at [facility], [address], [place], [place] [address]   Magnesium   Date Value Ref Range Status  01/04/2024 1.8 1.7 - 2.4 mg/dL Final    Comment:    Performed at [facility], [address] [place], [place] [address]   TSH  Date Value Ref Range Status  01/27/2024 1.435 0.350 - 4.500 uIU/mL Final    Comment:    Performed by a 3rd Generation assay with a functional sensitivity of <=0.01 uIU/mL. Performed at [facility], [address] [place], [place] [address]   12/21/2023 4.330 0.450 - 4.500 uIU/mL Final   PMH: 1. Type 1 diabetes with h/o DKA.  2. Diabetic gastroparesis: Severe  3. Adrenal insufficiency: She is on hydrocortisone  4. CKD stage 3: Diabetic nephropathy.  5. Chronic HF with recovered EF: H/o nonischemic cardiomyopathy.  - Echo (7/22): EF 40-45% => ?peri-partum cardiomyopathy.  - Echo (2/23): EF 25% - LHC (2/23, [facility]): no significant CAD.  - Echo (3/23): EF 20% - Echo (10/23): EF 30-35% ([facility]) - Echo (6/25): EF 55-60%, GLS -23.4%, normal diastolic function, RV normal, mild-moderate MR, IVC normal - Cardiac MRI (8/25): mild LV dilation with LV EF 35%, RV EF 34%, no delayed enhancement, and possible severe MR with regurgitant fraction 45%. - TEE (8/25): EF 35-40%, only trivial mitral regurgitation and normal IVC.  6. Diabetic neuropathy.  7. PEA arrest after EGD in 3/23.  8. AKI in 3/23 from possible acute interstitial nephritis, required transient HD.  9. Enterococcal bacteremia in 3/23.  10. RUE DVT in 2022.   Vital Signs: Today's Vitals   02/28/24 1430  BP: 116/70  Pulse: 81  SpO2: 100%  Weight: 118 lb 12.8 oz (53.9 kg)    Assessment/Plan: 1. Chronic systolic CHF: Patient has history of nonischemic  cardiomyopathy that was primarily followed at [facility] in the past.  There was concern that this could be a peri-partum cardiomyopathy.  Echo in 11/2020 after the birth of her second daughter showed EF 40-45%.  This had declined to 25% by 06/2021, ?peripartum CMP.  Cath in 06/2021 at [facility] showed no significant CAD.  However, echo in 10/2023 at [facility] was reviewed and noted to be relatively normal except for mild to moderate MR => EF 55-60%, GLS -23.4%, normal diastolic function, RV normal, mild-moderate MR, IVC normal.  Despite the unremarkable echo in 10/2023, she struggled with volume overload and was admitted in 12/2023 with CHF.  Cardiac MRI in 12/2023 showed moderately depressed LV EF 35% and RV EF 34%, no LGE/scarring noted, mitral regurgitation looked severe.  This was a significant change from 10/2023 echo.  I think coronary disease is unlikely with no LGE on cMRI and normal cath in 2023.  Would avoid coronary angiography with elevated creatinine.  Possible diabetic cardiomyopathy. TEE was done after diuresis to assess MR, this showed EF 35-40%, mild RV dysfunction, and only trivial mitral regurgitation. She  developed AKI with diuresis during 12/2023 admission.Creatinine improved to 1.86, but experienced another AKI ~2 weeks ago. Creatinine yesterday was down to 2.12 and K stable at 5.2. NYHA class II currently, not volume overloaded on exam.  BP stable.  - She can continue Lasix  20 mg. Reviewed appropriate administration instructions for PRN Lasix . - Continue Toprol  XL 25 mg daily.  - Transition from valsartan  to Entresto  24/26 bid as previously intended. Creatinine and potassium improved on RFP yesterday. BMET in 7 days.  - SGLT2 inhibitor is contraindicated with type 1 diabetes.  - Spironolactone  is contraindicated with creatinine >2 mg/dL. Even with potassium binders, would not restart.  2. Hyperlipidemia: continue Crestor  10 mg daily.  3. CKD stage 3: Diabetic nephropathy.  Creatinine baseline around 2, up to  3.15 with elevated BUN with diuresis in 12/2023.  Now back down to baseline ~2 yesterday.  - Patient did not tolerate Veltassa recently started by nephrology, will transition to Lokelma  10g daily. BMET in 1 week. -May eventually benefit from Kerendia given CKD if potassium is controlled with Lokelma . - Better glucose control with Omnipod should help potassium as well. - Follow closely, BMET in 1 week.  4. Type 1 diabetes: Difficult to control.  Patient is transitioning to omnipod insulin  delivery tomorrow, which should significantly improve glucose control. 5. HTN: BP stable, med adjustment as above.      Follow up: In 1 week with CHF clinic  Please do not hesitate to reach out with questions or concerns,  Jaun Bash, PharmD, CPP, BCPS, [facility] Heart Failure Pharmacist  Phone - [phone] 02/28/2024 3:39 PM

## 2024-02-28 ENCOUNTER — Ambulatory Visit: Attending: Family | Admitting: Pharmacist

## 2024-02-28 VITALS — BP 116/70 | HR 81 | Wt 118.8 lb

## 2024-02-28 DIAGNOSIS — E1022 Type 1 diabetes mellitus with diabetic chronic kidney disease: Secondary | ICD-10-CM | POA: Insufficient documentation

## 2024-02-28 DIAGNOSIS — I340 Nonrheumatic mitral (valve) insufficiency: Secondary | ICD-10-CM | POA: Diagnosis not present

## 2024-02-28 DIAGNOSIS — I5022 Chronic systolic (congestive) heart failure: Secondary | ICD-10-CM | POA: Diagnosis not present

## 2024-02-28 DIAGNOSIS — Z79899 Other long term (current) drug therapy: Secondary | ICD-10-CM | POA: Insufficient documentation

## 2024-02-28 DIAGNOSIS — E1043 Type 1 diabetes mellitus with diabetic autonomic (poly)neuropathy: Secondary | ICD-10-CM | POA: Diagnosis not present

## 2024-02-28 DIAGNOSIS — E2740 Unspecified adrenocortical insufficiency: Secondary | ICD-10-CM | POA: Diagnosis not present

## 2024-02-28 DIAGNOSIS — I5042 Chronic combined systolic (congestive) and diastolic (congestive) heart failure: Secondary | ICD-10-CM

## 2024-02-28 DIAGNOSIS — Z794 Long term (current) use of insulin: Secondary | ICD-10-CM | POA: Diagnosis not present

## 2024-02-28 DIAGNOSIS — N183 Chronic kidney disease, stage 3 unspecified: Secondary | ICD-10-CM | POA: Diagnosis not present

## 2024-02-28 DIAGNOSIS — E785 Hyperlipidemia, unspecified: Secondary | ICD-10-CM | POA: Insufficient documentation

## 2024-02-28 DIAGNOSIS — I130 Hypertensive heart and chronic kidney disease with heart failure and stage 1 through stage 4 chronic kidney disease, or unspecified chronic kidney disease: Secondary | ICD-10-CM | POA: Diagnosis not present

## 2024-02-28 DIAGNOSIS — K3184 Gastroparesis: Secondary | ICD-10-CM | POA: Insufficient documentation

## 2024-02-28 DIAGNOSIS — I428 Other cardiomyopathies: Secondary | ICD-10-CM | POA: Diagnosis not present

## 2024-02-28 MED ORDER — METOPROLOL SUCCINATE ER 25 MG PO TB24: 25.0000 mg | ORAL_TABLET | Freq: Every day | ORAL | 0 refills | Status: DC

## 2024-02-28 MED ORDER — SACUBITRIL-VALSARTAN 24-26 MG PO TABS: 1.0000 | ORAL_TABLET | Freq: Two times a day (BID) | ORAL | 2 refills | Status: AC

## 2024-02-28 MED ORDER — LOKELMA 10 G PO PACK: 10.0000 g | PACK | Freq: Every day | ORAL | 3 refills | Status: AC

## 2024-02-28 NOTE — Patient Instructions (Addendum)
 It was a pleasure seeing you today!  MEDICATIONS: -STOP valsartan  tablets -START sacubatril/valsartan  (Entresto ) 24-26 mg twice daily -STOP spironolactone .  -STOP Isosorbide . If you experience any increase in frequency of chest pain after stopping, restart the medication and call the office. -Call if you have questions about your medications.  LABS: -We will call you if your labs need attention.  NEXT APPOINTMENT: Return to clinic in 1 week to see Kura Bethards.  In general, to take care of your heart failure: -Limit your fluid intake to 2 Liters (half-gallon) per day.   -Limit your salt intake to ideally 2-3 grams (2000-3000 mg) per day. -Weigh yourself daily and record, and bring that weight diary to your next appointment.  (Weight gain of 2-3 pounds in 1 day typically means fluid weight.) -The medications for your heart are to help your heart and help you live longer.   -Please contact us  before stopping any of your heart medications.  Call the clinic at [phone] with questions or to reschedule future appointments.

## 2024-03-04 DIAGNOSIS — Z419 Encounter for procedure for purposes other than remedying health state, unspecified: Secondary | ICD-10-CM | POA: Diagnosis not present

## 2024-03-06 DIAGNOSIS — D638 Anemia in other chronic diseases classified elsewhere: Secondary | ICD-10-CM | POA: Diagnosis not present

## 2024-03-06 DIAGNOSIS — K3184 Gastroparesis: Secondary | ICD-10-CM | POA: Diagnosis not present

## 2024-03-06 DIAGNOSIS — R7989 Other specified abnormal findings of blood chemistry: Secondary | ICD-10-CM | POA: Diagnosis not present

## 2024-03-06 DIAGNOSIS — E1010 Type 1 diabetes mellitus with ketoacidosis without coma: Secondary | ICD-10-CM | POA: Diagnosis not present

## 2024-03-06 DIAGNOSIS — I5020 Unspecified systolic (congestive) heart failure: Secondary | ICD-10-CM | POA: Diagnosis not present

## 2024-03-06 DIAGNOSIS — E46 Unspecified protein-calorie malnutrition: Secondary | ICD-10-CM | POA: Diagnosis not present

## 2024-03-06 DIAGNOSIS — N184 Chronic kidney disease, stage 4 (severe): Secondary | ICD-10-CM | POA: Diagnosis not present

## 2024-03-07 ENCOUNTER — Other Ambulatory Visit (HOSPITAL_COMMUNITY): Payer: Self-pay

## 2024-03-07 ENCOUNTER — Encounter: Admitting: Cardiology

## 2024-03-07 ENCOUNTER — Other Ambulatory Visit
Admission: RE | Admit: 2024-03-07 | Discharge: 2024-03-07 | Disposition: A | Discharge status: Home / Self Care | Source: Ambulatory Visit | Attending: Cardiology | Admitting: Cardiology

## 2024-03-07 ENCOUNTER — Ambulatory Visit (HOSPITAL_COMMUNITY): Payer: Self-pay | Admitting: Cardiology

## 2024-03-07 ENCOUNTER — Ambulatory Visit: Admitting: Pharmacist

## 2024-03-07 VITALS — BP 114/74 | HR 79 | Wt 116.6 lb

## 2024-03-07 DIAGNOSIS — I5042 Chronic combined systolic (congestive) and diastolic (congestive) heart failure: Secondary | ICD-10-CM | POA: Insufficient documentation

## 2024-03-07 LAB — BASIC METABOLIC PANEL WITH GFR
Anion gap: 10 (ref 5–15)
BUN: 49 mg/dL — ABNORMAL HIGH (ref 6–20)
CO2: 22 mmol/L (ref 22–32)
Calcium: 8.5 mg/dL — ABNORMAL LOW (ref 8.9–10.3)
Chloride: 111 mmol/L (ref 98–111)
Creatinine, Ser: 2.01 mg/dL — ABNORMAL HIGH (ref 0.44–1.00)
GFR, Estimated: 33 mL/min — ABNORMAL LOW (ref >60)
Glucose, Bld: 111 mg/dL — ABNORMAL HIGH (ref 70–99)
Potassium: 4.8 mmol/L (ref 3.5–5.1)
Sodium: 143 mmol/L (ref 135–145)

## 2024-03-07 NOTE — Progress Notes (Signed)
 Advanced Heart Failure Clinic Note  PCP: Dineen Channel, PA-C PCP-Cardiologist: Redell Cave, MD HF-Cardiologist: Ezra Shuck, MD  HPI:  33 y.o. with history of type 1 diabetes, CKD stage 3/diabetic nephropathy, diabetic gastroparesis, adrenal insufficiency, and CHF was referred by Dr. Claudene in the [facility] ER for evaluation of CHF.  She has a rather complex history.  Echo in 11/2020 after the birth of her second daughter showed EF 40-45%. This had declined to 25% by 06/2021, ?peripartum CMP.  Cath in 06/2021 at [facility] showed no significant CAD.  She had AKI in 07/2021 requiring transient dialysis, possible acute interstitial nephritis.  She also had Enterococcal bacteremia that admission in 07/2021. She has numerous diabetic complications including gastroparesis, nephropathy, and neuropathy.     She was hospitalized at [facility] in 10/2023 for possible PNA.  She was seen in the ER 12/22/23 with dyspnea and LE edema.  She says the edema had been present since 10/2023 admission and she think she got too much IVF in the hospital. LE US  showed not DVT.  CXR showed vascular congestion.  BNP was severely elevated.  Interestingly, patient had had an echo in 6/25 at [facility] that I reviewed; this was fairly normal with EF 55-60%, GLS -23.4%, normal diastolic function, RV normal, mild-moderate MR, IVC normal.  Echo did not match clinical picture.  Patient was started on Lasix  20 mg daily when she was sent home from the ER.    Patient was admitted in 12/2023 with CHF.  She was diuresed with Lasix  gtt, developed AKI with creatinine up to 3.15. Cardiac MRI showed mild LV dilation with LV EF 35%, RV EF 34%, no delayed enhancement, and possible severe MR with regurgitant fraction 45%.  TEE was done to reassess mitral regurgitation after diuresis, this showed EF 35-40%, only trivial mitral regurgitation and normal IVC.    Interval history: Metoprolol  has recently been restarted and tolerated well this far. Spironolactone  was held  and the patient was continued on valsartan  with two doses of Lokelma . Recently seen by nephrology where Veltassa samples were given, however, the patient can not tolerate the taste and prefers to swap back to Lokelma . She had not starrted taking Entresto , so this was initiated at pharmacy visit last week.  Today Ashley Camacho returns to Heart Failure Clinic for pharmacist medication titration. Reports feeling good. Reports occasional dizziness and lightheadedness worsened slightly this AM, but is also backing down on hydrocortisone . Denies fatigue, chest pain, palpitations, SOB, LEE, orthopnea, orthostasis, PND. Reports being able to complete all activities of daily living (ADLs). Is active throughout the day. Weight at home is not measured, scale provided today. Takes furosemide  prn, has not required in months. Appetite is good. Does follow a low sodium diet.  Current Heart Failure Medications: Loop diuretic: furosemide  20 mg prn Beta-Blocker: metoprolol  succinate 25 mg daily ACEI/ARB/ARNI: Entresto  24-26 mg BID MRA: none SGLT2i: none Other: none  Has the patient been experiencing any side effects to the medications prescribed? Yes. Hyperkalemia with spironolactone .  Does the patient have any problems obtaining medications due to transportation or finances? No  Understanding of regimen: Excellent  Understanding of indications: Good  Potential of adherence: Good  Patient understands to avoid NSAIDs.  Patient understands to avoid decongestants.  Pertinent Lab Values: Creatinine  Date Value Ref Range Status  03/01/2013 0.76 0.60 - 1.30 mg/dL Final   Creatinine, Ser  Date Value Ref Range Status  02/17/2024 2.65 (H) 0.44 - 1.00 mg/dL Final   BUN  Date Value  Ref Range Status  02/17/2024 74 (H) 6 - 20 mg/dL Final  92/69/7974 35 (H) 6 - 20 mg/dL Final  89/90/7985 13 7 - 18 mg/dL Final   Potassium  Date Value Ref Range Status  02/17/2024 5.4 (H) 3.5 - 5.1 mmol/L Final   03/01/2013 4.1 3.5 - 5.1 mmol/L Final   Sodium  Date Value Ref Range Status  02/17/2024 138 135 - 145 mmol/L Final  12/21/2023 145 (H) 134 - 144 mmol/L Final  03/01/2013 133 (L) 136 - 145 mmol/L Final   B Natriuretic Peptide  Date Value Ref Range Status  02/07/2024 933.9 (H) 0.0 - 100.0 pg/mL Final    Comment:    Performed at [facility], [address] [place], [place] [address]   Magnesium   Date Value Ref Range Status  01/04/2024 1.8 1.7 - 2.4 mg/dL Final    Comment:    Performed at [facility], [address] [place], [place] [address]   TSH  Date Value Ref Range Status  01/27/2024 1.435 0.350 - 4.500 uIU/mL Final    Comment:    Performed by a 3rd Generation assay with a functional sensitivity of <=0.01 uIU/mL. Performed at [facility], [address] [place], [place] [address]   12/21/2023 4.330 0.450 - 4.500 uIU/mL Final   PMH: 1. Type 1 diabetes with h/o DKA.  2. Diabetic gastroparesis: Severe  3. Adrenal insufficiency: She is on hydrocortisone  4. CKD stage 3: Diabetic nephropathy.  5. Chronic HF with recovered EF: H/o nonischemic cardiomyopathy.  - Echo (7/22): EF 40-45% => ?peri-partum cardiomyopathy.  - Echo (2/23): EF 25% - LHC (2/23, [facility]): no significant CAD.  - Echo (3/23): EF 20% - Echo (10/23): EF 30-35% ([facility]) - Echo (6/25): EF 55-60%, GLS -23.4%, normal diastolic function, RV normal, mild-moderate MR, IVC normal - Cardiac MRI (8/25): mild LV dilation with LV EF 35%, RV EF 34%, no delayed enhancement, and possible severe MR with regurgitant fraction 45%. - TEE (8/25): EF 35-40%, only trivial mitral regurgitation and normal IVC.  6. Diabetic neuropathy.  7. PEA arrest after EGD in 3/23.  8. AKI in 3/23 from possible acute interstitial nephritis, required transient HD.  9. Enterococcal bacteremia in 3/23.  10. RUE DVT in 2022.   Vital Signs: Today's Vitals   03/07/24 1340  BP: 114/74  Pulse: 79  SpO2: 100%   Weight: 116 lb 9.6 oz (52.9 kg)     Assessment/Plan: 1. Chronic systolic CHF: Patient has history of nonischemic cardiomyopathy that was primarily followed at [facility] in the past.  There was concern that this could be a peri-partum cardiomyopathy.  Echo in 11/2020 after the birth of her second daughter showed EF 40-45%.  This had declined to 25% by 06/2021, ?peripartum CMP.  Cath in 06/2021 at [facility] showed no significant CAD.  However, echo in 10/2023 at [facility] was reviewed and noted to be relatively normal except for mild to moderate MR => EF 55-60%, GLS -23.4%, normal diastolic function, RV normal, mild-moderate MR, IVC normal.  Despite the unremarkable echo in 10/2023, she struggled with volume overload and was admitted in 12/2023 with CHF.  Cardiac MRI in 12/2023 showed moderately depressed LV EF 35% and RV EF 34%, no LGE/scarring noted, mitral regurgitation looked severe.  This was a significant change from 10/2023 echo.  I think coronary disease is unlikely with no LGE on cMRI and normal cath in 2023.  Would avoid coronary angiography with elevated creatinine.  Possible diabetic cardiomyopathy. TEE was  done after diuresis to assess MR, this showed EF 35-40%, mild RV dysfunction, and only trivial mitral regurgitation. She developed AKI with diuresis during 12/2023 admission.Creatinine improved to 1.86, but experienced another AKI ~2 weeks ago. Creatinine last week down to 2.12 and K stable at 5.2. NYHA class II currently, not volume overloaded on exam.  BP stable.  - She can continue Lasix  20 mg prn. Reviewed appropriate administration instructions for PRN Lasix  and scale provided today. - Continue Toprol  XL 25 mg daily. Hesitant to increase today given variable glucose at home for fear of masking hypoglycemia. Consider after OmniPod. - Continue Entresto  24/26 bid. BP stable today, several episodes of lightheadedness today, though fine prior. Monitor for now. BMET today.  - SGLT2 inhibitor is contraindicated with  type 1 diabetes.  - Spironolactone  is contraindicated with creatinine >2 mg/dL. Even with potassium binders, would not restart. Leonore copay is $4, now that she is on Lokelma , may be fine to start if she meets eGFR and K criteria on today's labs.  2. Hyperlipidemia: continue Crestor  10 mg daily.  3. CKD stage 3: Diabetic nephropathy.  Creatinine baseline around 2, up to 3.15 with elevated BUN with diuresis in 12/2023.  Now back down to baseline ~2 last week.  - Patient did not tolerate Veltassa recently started by nephrology, will transition to Lokelma  10g daily. BMET in 1 week. -May eventually benefit from Kerendia given if potassium is controlled with Lokelma . - Better glucose control with Omnipod should help potassium as well. She has not started Omnipod as she is waiting on education. 4. Type 1 diabetes: Difficult to control.  Patient is transitioning to omnipod insulin  delivery soon, which should significantly improve glucose control. 5. HTN: BP stable, med adjustment as above.   Follow up: In 1 month with CHF clinic  Please do not hesitate to reach out with questions or concerns,  Jaun Bash, PharmD, CPP, BCPS, [facility] Heart Failure Pharmacist  Phone - [phone] 03/07/2024 1:50 PM

## 2024-03-07 NOTE — Patient Instructions (Signed)
 It was a pleasure seeing you today!  MEDICATIONS: -No medication changes today -Call if you have questions about your medications.  LABS: -We will call you if your labs need attention.  NEXT APPOINTMENT: Return to clinic in 1 month to see Dr. Rolan.  In general, to take care of your heart failure: -Limit your fluid intake to 2 Liters (half-gallon) per day.   -Limit your salt intake to ideally 2-3 grams (2000-3000 mg) per day. -Weigh yourself daily and record, and bring that weight diary to your next appointment.  (Weight gain of 2-3 pounds in 1 day typically means fluid weight.) -The medications for your heart are to help your heart and help you live longer.   -Please contact us  before stopping any of your heart medications.  Call the clinic at [phone] with questions or to reschedule future appointments.

## 2024-03-16 ENCOUNTER — Telehealth: Payer: Self-pay | Admitting: Oncology

## 2024-03-16 NOTE — Telephone Encounter (Signed)
 Per referral inbasket, schedule pt for MD followed by labs, next available.  I called and left vm for pt with appt details and sent a mychart msg.

## 2024-03-19 ENCOUNTER — Ambulatory Visit (INDEPENDENT_AMBULATORY_CARE_PROVIDER_SITE_OTHER): Admitting: Physician Assistant

## 2024-03-20 ENCOUNTER — Ambulatory Visit: Admitting: Physician Assistant

## 2024-03-20 ENCOUNTER — Telehealth: Payer: Self-pay | Admitting: Cardiology

## 2024-03-20 NOTE — Telephone Encounter (Signed)
 Called to r/s appt on 11/12

## 2024-03-22 ENCOUNTER — Telehealth: Payer: Self-pay | Admitting: *Deleted

## 2024-03-22 DIAGNOSIS — I10 Essential (primary) hypertension: Secondary | ICD-10-CM

## 2024-03-22 NOTE — Progress Notes (Signed)
 Complex Care Management Note Care Guide Note  03/22/2024 Name: Ashley Camacho MRN: [id number] DOB: 1990/10/21   Complex Care Management Outreach Attempts: An unsuccessful telephone outreach was attempted today to offer the patient information about available complex care management services.  Follow Up Plan:  Additional outreach attempts will be made to offer the patient complex care management information and services.   Encounter Outcome:  No Answer  Thedford Franks, CMA [place]  [facility], [facility] Guide Direct Dial: [phone]  Fax: [phone] Website: [email or web address]

## 2024-03-23 ENCOUNTER — Other Ambulatory Visit: Payer: Self-pay | Admitting: Cardiology

## 2024-03-23 NOTE — Progress Notes (Signed)
 Complex Care Management Note Care Guide Note  03/23/2024 Name: Ashley Camacho MRN: [id number] DOB: [birth year]   Complex Care Management Outreach Attempts: A second unsuccessful outreach was attempted today to offer the patient with information about available complex care management services.  Follow Up Plan:  Additional outreach attempts will be made to offer the patient complex care management information and services.   Encounter Outcome:  No Answer  Thedford Franks, CMA [place]  [facility], [facility] Guide Direct Dial: [phone]  Fax: [phone] Website: [email or web address]

## 2024-03-26 ENCOUNTER — Emergency Department
Admission: EM | Admit: 2024-03-26 | Discharge: 2024-03-26 | Disposition: A | Discharge status: Home / Self Care | Source: Home / Self Care

## 2024-03-26 ENCOUNTER — Observation Stay
Admission: EM | Admit: 2024-03-26 | Discharge: 2024-03-27 | Disposition: A | Discharge status: Home / Self Care | Attending: Internal Medicine | Admitting: Internal Medicine

## 2024-03-26 ENCOUNTER — Other Ambulatory Visit: Payer: Self-pay

## 2024-03-26 DIAGNOSIS — R42 Dizziness and giddiness: Secondary | ICD-10-CM | POA: Diagnosis not present

## 2024-03-26 DIAGNOSIS — E039 Hypothyroidism, unspecified: Secondary | ICD-10-CM | POA: Diagnosis not present

## 2024-03-26 DIAGNOSIS — E162 Hypoglycemia, unspecified: Secondary | ICD-10-CM | POA: Diagnosis not present

## 2024-03-26 DIAGNOSIS — N183 Chronic kidney disease, stage 3 unspecified: Secondary | ICD-10-CM | POA: Insufficient documentation

## 2024-03-26 DIAGNOSIS — I5042 Chronic combined systolic (congestive) and diastolic (congestive) heart failure: Secondary | ICD-10-CM | POA: Insufficient documentation

## 2024-03-26 DIAGNOSIS — Z79899 Other long term (current) drug therapy: Secondary | ICD-10-CM | POA: Diagnosis not present

## 2024-03-26 DIAGNOSIS — I130 Hypertensive heart and chronic kidney disease with heart failure and stage 1 through stage 4 chronic kidney disease, or unspecified chronic kidney disease: Secondary | ICD-10-CM | POA: Diagnosis not present

## 2024-03-26 DIAGNOSIS — Z794 Long term (current) use of insulin: Secondary | ICD-10-CM | POA: Diagnosis not present

## 2024-03-26 DIAGNOSIS — E1122 Type 2 diabetes mellitus with diabetic chronic kidney disease: Secondary | ICD-10-CM | POA: Insufficient documentation

## 2024-03-26 DIAGNOSIS — D631 Anemia in chronic kidney disease: Secondary | ICD-10-CM | POA: Insufficient documentation

## 2024-03-26 DIAGNOSIS — R1116 Cannabis hyperemesis syndrome: Secondary | ICD-10-CM | POA: Insufficient documentation

## 2024-03-26 DIAGNOSIS — E1143 Type 2 diabetes mellitus with diabetic autonomic (poly)neuropathy: Secondary | ICD-10-CM | POA: Insufficient documentation

## 2024-03-26 DIAGNOSIS — E10649 Type 1 diabetes mellitus with hypoglycemia without coma: Principal | ICD-10-CM | POA: Insufficient documentation

## 2024-03-26 DIAGNOSIS — R112 Nausea with vomiting, unspecified: Secondary | ICD-10-CM | POA: Insufficient documentation

## 2024-03-26 DIAGNOSIS — N184 Chronic kidney disease, stage 4 (severe): Secondary | ICD-10-CM | POA: Insufficient documentation

## 2024-03-26 DIAGNOSIS — E271 Primary adrenocortical insufficiency: Secondary | ICD-10-CM | POA: Insufficient documentation

## 2024-03-26 DIAGNOSIS — Z9641 Presence of insulin pump (external) (internal): Secondary | ICD-10-CM | POA: Insufficient documentation

## 2024-03-26 DIAGNOSIS — E2740 Unspecified adrenocortical insufficiency: Secondary | ICD-10-CM | POA: Diagnosis not present

## 2024-03-26 LAB — CBG MONITORING, ED
Glucose-Capillary: 117 mg/dL — ABNORMAL HIGH (ref 70–99)
Glucose-Capillary: 35 mg/dL — CL (ref 70–99)
Glucose-Capillary: 50 mg/dL — ABNORMAL LOW (ref 70–99)
Glucose-Capillary: 52 mg/dL — ABNORMAL LOW (ref 70–99)
Glucose-Capillary: 74 mg/dL (ref 70–99)
Glucose-Capillary: 92 mg/dL (ref 70–99)

## 2024-03-26 LAB — CBC
HCT: 24.8 % — ABNORMAL LOW (ref 36.0–46.0)
Hemoglobin: 8 g/dL — ABNORMAL LOW (ref 12.0–15.0)
MCH: 31.1 pg (ref 26.0–34.0)
MCHC: 32.3 g/dL (ref 30.0–36.0)
MCV: 96.5 fL (ref 80.0–100.0)
Platelets: 123 K/uL — ABNORMAL LOW (ref 150–400)
RBC: 2.57 MIL/uL — ABNORMAL LOW (ref 3.87–5.11)
RDW: 12.9 % (ref 11.5–15.5)
WBC: 3.7 K/uL — ABNORMAL LOW (ref 4.0–10.5)
nRBC: 0 % (ref 0.0–0.2)

## 2024-03-26 LAB — CBC WITH DIFFERENTIAL/PLATELET
Abs Immature Granulocytes: 0.01 K/uL (ref 0.00–0.07)
Basophils Absolute: 0 K/uL (ref 0.0–0.1)
Basophils Relative: 1 %
Eosinophils Absolute: 0.1 K/uL (ref 0.0–0.5)
Eosinophils Relative: 3 %
HCT: 32.6 % — ABNORMAL LOW (ref 36.0–46.0)
Hemoglobin: 10.6 g/dL — ABNORMAL LOW (ref 12.0–15.0)
Immature Granulocytes: 0 %
Lymphocytes Relative: 30 %
Lymphs Abs: 1.3 K/uL (ref 0.7–4.0)
MCH: 31.4 pg (ref 26.0–34.0)
MCHC: 32.5 g/dL (ref 30.0–36.0)
MCV: 96.4 fL (ref 80.0–100.0)
Monocytes Absolute: 0.2 K/uL (ref 0.1–1.0)
Monocytes Relative: 6 %
Neutro Abs: 2.5 K/uL (ref 1.7–7.7)
Neutrophils Relative %: 60 %
Platelets: 116 K/uL — ABNORMAL LOW (ref 150–400)
RBC: 3.38 MIL/uL — ABNORMAL LOW (ref 3.87–5.11)
RDW: 13 % (ref 11.5–15.5)
WBC: 4.2 K/uL (ref 4.0–10.5)
nRBC: 0 % (ref 0.0–0.2)

## 2024-03-26 LAB — BASIC METABOLIC PANEL WITH GFR
Anion gap: 8 (ref 5–15)
Anion gap: 7 (ref 5–15)
BUN: 36 mg/dL — ABNORMAL HIGH (ref 6–20)
BUN: 36 mg/dL — ABNORMAL HIGH (ref 6–20)
CO2: 23 mmol/L (ref 22–32)
CO2: 24 mmol/L (ref 22–32)
Calcium: 8.1 mg/dL — ABNORMAL LOW (ref 8.9–10.3)
Calcium: 8.1 mg/dL — ABNORMAL LOW (ref 8.9–10.3)
Chloride: 111 mmol/L (ref 98–111)
Chloride: 111 mmol/L (ref 98–111)
Creatinine, Ser: 2.07 mg/dL — ABNORMAL HIGH (ref 0.44–1.00)
Creatinine, Ser: 2.01 mg/dL — ABNORMAL HIGH (ref 0.44–1.00)
GFR, Estimated: 32 mL/min — ABNORMAL LOW (ref >60)
GFR, Estimated: 33 mL/min — ABNORMAL LOW (ref >60)
Glucose, Bld: 76 mg/dL (ref 70–99)
Glucose, Bld: 113 mg/dL — ABNORMAL HIGH (ref 70–99)
Potassium: 4.6 mmol/L (ref 3.5–5.1)
Potassium: 4.4 mmol/L (ref 3.5–5.1)
Sodium: 142 mmol/L (ref 135–145)
Sodium: 142 mmol/L (ref 135–145)

## 2024-03-26 LAB — URINALYSIS, ROUTINE W REFLEX MICROSCOPIC
Bilirubin Urine: NEGATIVE
Glucose, UA: >=500 mg/dL — AB
Hgb urine dipstick: NEGATIVE
Ketones, ur: NEGATIVE mg/dL
Nitrite: NEGATIVE
Protein, ur: 100 mg/dL — AB
Specific Gravity, Urine: 1.009 (ref 1.005–1.030)
WBC, UA: >50 WBC/hpf (ref 0–5)
pH: 5 (ref 5.0–8.0)

## 2024-03-26 LAB — IRON AND TIBC
Iron: 55 ug/dL (ref 28–170)
Saturation Ratios: 35 % — ABNORMAL HIGH (ref 10.4–31.8)
TIBC: 160 ug/dL — ABNORMAL LOW (ref 250–450)
UIBC: 105 ug/dL

## 2024-03-26 LAB — URINE DRUG SCREEN, QUALITATIVE (ARMC ONLY)
Amphetamines, Ur Screen: NOT DETECTED
Barbiturates, Ur Screen: NOT DETECTED
Benzodiazepine, Ur Scrn: NOT DETECTED
Cannabinoid 50 Ng, Ur ~~LOC~~: POSITIVE — AB
Cocaine Metabolite,Ur ~~LOC~~: NOT DETECTED
MDMA (Ecstasy)Ur Screen: NOT DETECTED
Methadone Scn, Ur: NOT DETECTED
Opiate, Ur Screen: NOT DETECTED
Phencyclidine (PCP) Ur S: NOT DETECTED
Tricyclic, Ur Screen: NOT DETECTED

## 2024-03-26 LAB — BLOOD GAS, VENOUS
Acid-base deficit: 3 mmol/L — ABNORMAL HIGH (ref 0.0–2.0)
Bicarbonate: 24.4 mmol/L (ref 20.0–28.0)
O2 Saturation: 67.6 %
Patient temperature: 37
pCO2, Ven: 52 mmHg (ref 44–60)
pH, Ven: 7.28 (ref 7.25–7.43)
pO2, Ven: 41 mmHg (ref 32–45)

## 2024-03-26 LAB — MAGNESIUM: Magnesium: 1.6 mg/dL — ABNORMAL LOW (ref 1.7–2.4)

## 2024-03-26 LAB — GLUCOSE, CAPILLARY
Glucose-Capillary: 99 mg/dL (ref 70–99)
Glucose-Capillary: 414 mg/dL — ABNORMAL HIGH (ref 70–99)

## 2024-03-26 LAB — FOLATE: Folate: 18.3 ng/mL (ref >5.9)

## 2024-03-26 LAB — PHOSPHORUS: Phosphorus: 3.1 mg/dL (ref 2.5–4.6)

## 2024-03-26 LAB — TSH: TSH: 1.778 u[IU]/mL (ref 0.350–4.500)

## 2024-03-26 MED ORDER — ACETAMINOPHEN 650 MG RE SUPP: 650.0000 mg | Freq: Four times a day (QID) | RECTAL | Status: DC | PRN

## 2024-03-26 MED ORDER — GLUCAGON HCL RDNA (DIAGNOSTIC) 1 MG IJ SOLR
1.0000 mg | Freq: Once | INTRAMUSCULAR | Status: AC | PRN
  Administered 2024-03-26: 1 mg via INTRAMUSCULAR
  Filled 2024-03-26: qty 1

## 2024-03-26 MED ORDER — MORPHINE SULFATE (PF) 2 MG/ML IV SOLN: 1.0000 mg | Freq: Four times a day (QID) | INTRAVENOUS | Status: DC | PRN

## 2024-03-26 MED ORDER — DEXTROSE 50 % IV SOLN
25.0000 g | Freq: Once | INTRAVENOUS | Status: AC
  Administered 2024-03-26: 25 g via INTRAVENOUS
  Filled 2024-03-26: qty 50

## 2024-03-26 MED ORDER — ALBUTEROL SULFATE (2.5 MG/3ML) 0.083% IN NEBU: 2.5000 mg | INHALATION_SOLUTION | Freq: Four times a day (QID) | RESPIRATORY_TRACT | Status: DC | PRN

## 2024-03-26 MED ORDER — HYDROCODONE-ACETAMINOPHEN 5-325 MG PO TABS: 1.0000 | ORAL_TABLET | ORAL | Status: DC | PRN

## 2024-03-26 MED ORDER — DEXTROSE 5 % IV SOLN: Freq: Once | INTRAVENOUS | Status: AC

## 2024-03-26 MED ORDER — HYDROCORTISONE 10 MG PO TABS
20.0000 mg | ORAL_TABLET | Freq: Every day | ORAL | Status: DC
  Administered 2024-03-27: 20 mg via ORAL
  Filled 2024-03-26: qty 2

## 2024-03-26 MED ORDER — SODIUM ZIRCONIUM CYCLOSILICATE 10 G PO PACK
10.0000 g | PACK | Freq: Every day | ORAL | Status: DC
  Administered 2024-03-27: 10 g via ORAL
  Filled 2024-03-26: qty 1

## 2024-03-26 MED ORDER — ROSUVASTATIN CALCIUM 10 MG PO TABS
10.0000 mg | ORAL_TABLET | Freq: Every day | ORAL | Status: DC
  Administered 2024-03-27: 10 mg via ORAL
  Filled 2024-03-26: qty 1

## 2024-03-26 MED ORDER — METOPROLOL SUCCINATE ER 25 MG PO TB24
25.0000 mg | ORAL_TABLET | Freq: Every day | ORAL | Status: DC
  Administered 2024-03-27: 25 mg via ORAL
  Filled 2024-03-26: qty 1

## 2024-03-26 MED ORDER — SENNOSIDES-DOCUSATE SODIUM 8.6-50 MG PO TABS: 1.0000 | ORAL_TABLET | Freq: Every evening | ORAL | Status: DC | PRN

## 2024-03-26 MED ORDER — ONDANSETRON HCL 4 MG PO TABS: 4.0000 mg | ORAL_TABLET | Freq: Four times a day (QID) | ORAL | Status: DC | PRN

## 2024-03-26 MED ORDER — ISOSORBIDE MONONITRATE ER 30 MG PO TB24
15.0000 mg | ORAL_TABLET | Freq: Every day | ORAL | Status: DC
  Administered 2024-03-27: 15 mg via ORAL
  Filled 2024-03-26: qty 1

## 2024-03-26 MED ORDER — INSULIN GLARGINE-YFGN 100 UNIT/ML ~~LOC~~ SOLN
6.0000 [IU] | Freq: Every day | SUBCUTANEOUS | Status: DC
  Administered 2024-03-27: 6 [IU] via SUBCUTANEOUS
  Filled 2024-03-26 (×2): qty 0.06

## 2024-03-26 MED ORDER — TRAZODONE HCL 50 MG PO TABS: 25.0000 mg | ORAL_TABLET | Freq: Every evening | ORAL | Status: DC | PRN

## 2024-03-26 MED ORDER — SODIUM CHLORIDE 0.9 % IV BOLUS
1000.0000 mL | Freq: Once | INTRAVENOUS | Status: AC
  Administered 2024-03-26: 1000 mL via INTRAVENOUS

## 2024-03-26 MED ORDER — SODIUM CHLORIDE 0.9% FLUSH
3.0000 mL | Freq: Two times a day (BID) | INTRAVENOUS | Status: DC
  Administered 2024-03-26 – 2024-03-27 (×2): 3 mL via INTRAVENOUS

## 2024-03-26 MED ORDER — PANCRELIPASE (LIP-PROT-AMYL) 12000-38000 UNITS PO CPEP
24000.0000 [IU] | ORAL_CAPSULE | Freq: Three times a day (TID) | ORAL | Status: DC
  Administered 2024-03-27: 24000 [IU] via ORAL
  Filled 2024-03-26 (×3): qty 2

## 2024-03-26 MED ORDER — INSULIN ASPART 100 UNIT/ML IJ SOLN: 2.0000 [IU] | Freq: Three times a day (TID) | INTRAMUSCULAR | Status: DC

## 2024-03-26 MED ORDER — INSULIN ASPART 100 UNIT/ML IJ SOLN: 0.0000 [IU] | Freq: Three times a day (TID) | INTRAMUSCULAR | Status: DC

## 2024-03-26 MED ORDER — SACUBITRIL-VALSARTAN 24-26 MG PO TABS
1.0000 | ORAL_TABLET | Freq: Two times a day (BID) | ORAL | Status: DC
  Administered 2024-03-26: 1 via ORAL
  Filled 2024-03-26 (×3): qty 1

## 2024-03-26 MED ORDER — ACETAMINOPHEN 325 MG PO TABS: 650.0000 mg | ORAL_TABLET | Freq: Four times a day (QID) | ORAL | Status: DC | PRN

## 2024-03-26 MED ORDER — DEXAMETHASONE SOD PHOSPHATE PF 10 MG/ML IJ SOLN
8.0000 mg | Freq: Once | INTRAMUSCULAR | Status: AC
  Administered 2024-03-26: 8 mg via INTRAVENOUS

## 2024-03-26 MED ORDER — DEXTROSE 5 % AND 0.9 % NACL IV BOLUS
1000.0000 mL | Freq: Once | INTRAVENOUS | Status: AC
  Administered 2024-03-26: 1000 mL via INTRAVENOUS

## 2024-03-26 MED ORDER — IPRATROPIUM-ALBUTEROL 0.5-2.5 (3) MG/3ML IN SOLN
3.0000 mL | Freq: Once | RESPIRATORY_TRACT | Status: AC
  Administered 2024-03-26: 3 mL via RESPIRATORY_TRACT
  Filled 2024-03-26: qty 3

## 2024-03-26 MED ORDER — HYDRALAZINE HCL 20 MG/ML IJ SOLN: 5.0000 mg | Freq: Four times a day (QID) | INTRAMUSCULAR | Status: DC | PRN

## 2024-03-26 MED ORDER — BISACODYL 5 MG PO TBEC: 5.0000 mg | DELAYED_RELEASE_TABLET | Freq: Every day | ORAL | Status: DC | PRN

## 2024-03-26 MED ORDER — SODIUM CHLORIDE 0.9 % IV BOLUS: 500.0000 mL | Freq: Once | INTRAVENOUS | Status: DC

## 2024-03-26 MED ORDER — ONDANSETRON HCL 4 MG/2ML IJ SOLN: 4.0000 mg | Freq: Four times a day (QID) | INTRAMUSCULAR | Status: DC | PRN

## 2024-03-26 MED ORDER — ONDANSETRON HCL 4 MG/2ML IJ SOLN
4.0000 mg | Freq: Once | INTRAMUSCULAR | Status: AC
  Administered 2024-03-26: 4 mg via INTRAVENOUS
  Filled 2024-03-26: qty 2

## 2024-03-26 MED ORDER — HYDROCORTISONE 10 MG PO TABS
10.0000 mg | ORAL_TABLET | Freq: Every day | ORAL | Status: DC
  Filled 2024-03-26: qty 1

## 2024-03-26 MED ORDER — ONDANSETRON 4 MG PO TBDP
8.0000 mg | ORAL_TABLET | Freq: Once | ORAL | Status: AC
  Administered 2024-03-26: 8 mg via ORAL
  Filled 2024-03-26: qty 2

## 2024-03-26 MED ORDER — INSULIN ASPART 100 UNIT/ML IJ SOLN
0.0000 [IU] | Freq: Every day | INTRAMUSCULAR | Status: DC
  Administered 2024-03-26 – 2024-03-27 (×2): 5 [IU] via SUBCUTANEOUS
  Filled 2024-03-26 (×2): qty 1

## 2024-03-26 MED ORDER — LEVOTHYROXINE SODIUM 50 MCG PO TABS
75.0000 ug | ORAL_TABLET | Freq: Every day | ORAL | Status: DC
  Administered 2024-03-27: 75 ug via ORAL
  Filled 2024-03-26: qty 1

## 2024-03-26 NOTE — ED Triage Notes (Signed)
 Pt reports she has not been able to keep her glucose elevated after taking a weed gummy pt got a low reading on her monitor drank a juice and mountain dew and still has not been able to keep it elevated. CBG 52 in triage. Pt reports she feels sluggish.

## 2024-03-26 NOTE — Discharge Instructions (Signed)
 For the next 24 hours, please carefully monitor your glucose.  Continue to use your insulin  pump.  Abstain from any further cannabis.  Follow-up with your primary care physician and return with any acutely worsening symptoms. -- RETURN PRECAUTIONS & AFTERCARE: (ENGLISH) RETURN PRECAUTIONS: Return immediately to the emergency department or see/call your doctor if you feel worse, weak or have changes in speech or vision, are short of breath, have fever, vomiting, pain, bleeding or dark stool, trouble urinating or any new issues. Return here or see/call your doctor if not improving as expected for your suspected condition. FOLLOW-UP CARE: Call your doctor and/or any doctors we referred you to for more advice and to make an appointment. Do this today, tomorrow or after the weekend. Some doctors only take PPO insurance so if you have HMO insurance you may want to contact your HMO or your regular doctor for referral to a specialist within your plan. Either way tell the doctor's office that it was a referral from the emergency department so you get the soonest possible appointment.  YOUR TEST RESULTS: Take result reports of any blood or urine tests, imaging tests and EKG's to your doctor and any referral doctor. Have any abnormal tests repeated. Your doctor or a referral doctor can let you know when this should be done. Also make sure your doctor contacts this hospital to get any test results that are not currently available such as cultures or special tests for infection and final imaging reports, which are often not available at the time you leave the ER but which may list additional important findings that are not documented on the preliminary report. BLOOD PRESSURE: If your blood pressure was greater than 120/80 have your blood pressure rechecked within 1 to 2 weeks. MEDICATION SIDE EFFECTS: Do not drive, walk, bike, take the bus, etc. if you have received or are being prescribed any sedating medications such as  those for pain or anxiety or certain antihistamines like Benadryl . If you have been give one of these here get a taxi home or have a friend drive you home. Ask your pharmacist to counsel you on potential side effects of any new medication

## 2024-03-26 NOTE — Progress Notes (Signed)
   03/26/24 1513  Vitals  Temp 98.7 F (37.1 C)  BP 116/80  MAP (mmHg) 91  BP Location Right Arm  BP Method Automatic  Patient Position (if appropriate) Lying  Pulse Rate 87  Pulse Rate Source Monitor  Resp 16  MEWS COLOR  MEWS Score Color Green  Oxygen Therapy  SpO2 100 %  O2 Device Room Air  MEWS Score  MEWS Temp 0  MEWS Systolic 0  MEWS Pulse 0  MEWS RR 0  MEWS LOC 0  MEWS Score 0   Patient admitted from ED alert, room air, with stable vitals. Skin assessment completed with Devaughn, RN.  Call bell education provided and pt deactivated Insulin  pump per Dr. Florie. Plan of care continues.

## 2024-03-26 NOTE — Plan of Care (Signed)
  Problem: Education: Goal: Knowledge of General Education information will improve Description: Including pain rating scale, medication(s)/side effects and non-pharmacologic comfort measures Outcome: Progressing   Problem: Clinical Measurements: Goal: Ability to maintain clinical measurements within normal limits will improve Outcome: Progressing   Problem: Activity: Goal: Risk for activity intolerance will decrease Outcome: Progressing   Problem: Nutrition: Goal: Adequate nutrition will be maintained Outcome: Progressing   Problem: Elimination: Goal: Will not experience complications related to bowel motility Outcome: Progressing   Problem: Pain Managment: Goal: General experience of comfort will improve and/or be controlled Outcome: Progressing   Problem: Skin Integrity: Goal: Risk for impaired skin integrity will decrease Outcome: Progressing   Problem: Coping: Goal: Ability to adjust to condition or change in health will improve Outcome: Progressing

## 2024-03-26 NOTE — ED Provider Notes (Signed)
 [facility] Provider Note    Event Date/Time   First MD Initiated Contact with Patient 03/26/24 1114     (approximate)   History   Chief Complaint: Hypoglycemia   HPI  Ashley Camacho is a 33 y.o. female with a history of brittle type 1 diabetes, CHF, CKD who comes to the ED with lightheadedness, fatigue, hypoglycemia at home.  She was in the ED this morning with nausea and hypoglycemia after taking THC Gummies last night.  Blood sugars were marginal but stable and she was feeling better, so she was discharged.  She ate a biscuit this morning for breakfast, but symptoms continue to worsen.  Denies chest pain or shortness of breath.          Past Medical History:  Diagnosis Date   Acute metabolic encephalopathy 06/10/2022   Adrenal insufficiency    Anemia    Anxiety    Blood transfusion without reported diagnosis    CHF (congestive heart failure) (HCC)    Chronic kidney disease    Depression    Gastroparesis    Hypertension    Hypotension    Intractable nausea and vomiting 11/13/2022   Migraine    Thyroid  disease    Type 1 diabetes [facility])     Current Outpatient Rx   Order #: [id number] Class: Normal   Order #: [id number] Class: Normal   Order #: [id number] Class: Historical Med   Order #: [id number] Class: Historical Med   Order #: [id number] Class: Normal   Order #: [id number] Class: Normal   Order #: [id number] Class: Historical Med   Order #: [id number] Class: Normal   Order #: [id number] Class: Normal   Order #: [id number] Class: Normal   Order #: [id number] Class: No Print   Order #: [id number] Class: No Print   Order #: [id number] Class: Historical Med   Order #: [id number] Class: Normal   Order #: [id number] Class: Normal   Order #: [id number] Class: Normal   Order #: [id number] Class: Normal   Order #: [id number] Class: Normal   Order #: [id number] Class: Normal   Order #: [id number] Class: Normal    Past Surgical History:  Procedure Laterality Date    BIOPSY  01/14/2021   Procedure: BIOPSY;  Surgeon: Eda Iha, MD;  Location: [facility] ENDOSCOPY;  Service: Gastroenterology;;   CENTRAL LINE INSERTION N/A 10/10/2023   Procedure: CENTRAL LINE INSERTION;  Surgeon: Marea Selinda RAMAN, MD;  Location: [facility] INVASIVE CV LAB;  Service: Cardiovascular;  Laterality: N/A;   CESAREAN SECTION     x2   CESAREAN SECTION WITH BILATERAL TUBAL LIGATION N/A 02/17/2021   Procedure: CESAREAN SECTION WITH BILATERAL TUBAL LIGATION;  Surgeon: Barbra Lang PARAS, DO;  Location: [facility] LD ORS;  Service: Obstetrics;  Laterality: N/A;   ESOPHAGOGASTRODUODENOSCOPY (EGD) WITH PROPOFOL  N/A 01/14/2021   Procedure: ESOPHAGOGASTRODUODENOSCOPY (EGD) WITH PROPOFOL ;  Surgeon: Eda Iha, MD;  Location: [facility] ENDOSCOPY;  Service: Gastroenterology;  Laterality: N/A;   IR BONE MARROW BIOPSY & ASPIRATION  12/16/2022   IR REPLC DUODEN/JEJUNO TUBE PERCUT W/FLUORO  11/22/2022   JEJUNOSTOMY N/A 06/13/2022   Procedure: DEXTER;  Surgeon: Jordis Laneta FALCON, MD;  Location: [facility] ORS;  Service: General;  Laterality: N/A;   MOUTH SURGERY     TEE WITHOUT CARDIOVERSION N/A 01/06/2024   Procedure: ECHOCARDIOGRAM, TRANSESOPHAGEAL;  Surgeon: Rolan Ezra RAMAN, MD;  Location: [facility] ORS;  Service: Cardiovascular;  Laterality: N/A;   TUBAL LIGATION      Physical Exam   Triage Vital Signs: ED Triage Vitals  Encounter Vitals Group     BP 03/26/24 1108 (!) 140/75  Girls Systolic BP Percentile --      Girls Diastolic BP Percentile --      Boys Systolic BP Percentile --      Boys Diastolic BP Percentile --      Pulse Rate 03/26/24 1108 88     Resp 03/26/24 1108 18     Temp 03/26/24 1108 98 F (36.7 C)     Temp src --      SpO2 03/26/24 1108 100 %     Weight 03/26/24 1109 115 lb (52.2 kg)     Height 03/26/24 1109 5' 1 (1.549 m)     Head Circumference --      Peak Flow --      Pain Score 03/26/24 1108 0     Pain Loc --      Pain Education --      Exclude from Growth Chart --     Most  recent vital signs: Vitals:   03/26/24 1108  BP: (!) 140/75  Pulse: 88  Resp: 18  Temp: 98 F (36.7 C)  SpO2: 100%    General: Awake, no distress.  Ill-appearing CV:  Good peripheral perfusion.  Regular rate rhythm Resp:  Normal effort.  Clear lungs Abd:  No distention.  Soft nontender Other:  Moist oral mucosa.  Pale, mottled skin.   ED Results / Procedures / Treatments   Labs (all labs ordered are listed, but only abnormal results are displayed) Labs Reviewed  CBC - Abnormal; Notable for the following components:      Result Value   WBC 3.7 (*)    RBC 2.57 (*)    Hemoglobin 8.0 (*)    HCT 24.8 (*)    Platelets 123 (*)    All other components within normal limits  BASIC METABOLIC PANEL WITH GFR - Abnormal; Notable for the following components:   Glucose, Bld 113 (*)    BUN 36 (*)    Creatinine, Ser 2.01 (*)    Calcium  8.1 (*)    GFR, Estimated 33 (*)    All other components within normal limits  CBG MONITORING, ED - Abnormal; Notable for the following components:   Glucose-Capillary 35 (*)    All other components within normal limits  CBG MONITORING, ED - Abnormal; Notable for the following components:   Glucose-Capillary 50 (*)    All other components within normal limits  URINALYSIS, ROUTINE W REFLEX MICROSCOPIC  CBG MONITORING, ED  POC URINE PREG, ED     EKG Interpreted by me Sinus rhythm rate of 78.  Normal axis and intervals.  Poor R wave progression.  No acute ischemic changes.   RADIOLOGY    PROCEDURES:  .Critical Care  Performed by: Viviann Pastor, MD Authorized by: Viviann Pastor, MD   Critical care provider statement:    Critical care time (minutes):  35   Critical care time was exclusive of:  Separately billable procedures and treating other patients   Critical care was necessary to treat or prevent imminent or life-threatening deterioration of the following conditions:  Endocrine crisis, metabolic crisis and shock   Critical care  was time spent personally by me on the following activities:  Development of treatment plan with patient or surrogate, discussions with consultants, evaluation of patient's response to treatment, examination of patient, obtaining history from patient or surrogate, ordering and performing treatments and interventions, ordering and review of laboratory studies, ordering and review of radiographic studies, pulse oximetry, re-evaluation of patient's condition and review of old charts  Care discussed with: admitting provider      MEDICATIONS ORDERED IN ED: Medications  dextrose  50 % solution 25 g (25 g Intravenous Given 03/26/24 1202)  glucagon (human recombinant) (GLUCAGEN) injection 1 mg (1 mg Intramuscular Given 03/26/24 1119)  ondansetron  (ZOFRAN -ODT) disintegrating tablet 8 mg (8 mg Oral Given 03/26/24 1133)  dexamethasone  (DECADRON ) injection 8 mg (8 mg Intravenous Given 03/26/24 1205)  dextrose  5 % and 0.9% NaCl 5-0.9 % bolus 1,000 mL (0 mLs Intravenous Stopped 03/26/24 1312)  dextrose  5 % solution ( Intravenous New Bag/Given 03/26/24 1338)     IMPRESSION / MDM / ASSESSMENT AND PLAN / ED COURSE  I reviewed the triage vital signs and the nursing notes.  DDx: Dehydration, electrolyte derangement, adrenal crisis, UTI, COVID, influenza  Patient's presentation is most consistent with acute presentation with potential threat to life or bodily function.  Patient presents with listlessness, appears pale.  Vital signs are stable.  Has difficult venous access, will give IM glucagon, orange juice while obtaining IV, then Decadron , D5 NS.  Will need to admit for persistent hypoglycemia.   ----------------------------------------- 1:52 PM on 03/26/2024 ----------------------------------------- Glucose normalized, D5 infusion started for now.  Case discussed with hospitalist for further management.  Received guidance from hospital diabetes coordinator: Note history of Type 1 DM. Please order CBG's q 4  hours and add Novolog  0-6 units q 4 hours once CBG>150 mg/dL. Also will need basal insulin  once CBG's stable to prevent DKA.    Case discussed with hospitalist       FINAL CLINICAL IMPRESSION(S) / ED DIAGNOSES   Final diagnoses:  Type 1 diabetes mellitus with hypoglycemia and without coma (HCC)  Nausea and vomiting, unspecified vomiting type  Adrenal insufficiency     Rx / DC Orders   ED Discharge Orders     None        Note:  This document was prepared using Dragon voice recognition software and may include unintentional dictation errors.   Viviann Pastor, MD 03/26/24 [phone]

## 2024-03-26 NOTE — ED Triage Notes (Signed)
 Pt comes with hypoglycemia. Pt was just seen here early this morning and dc home about 5am. Pt went home and ate but woke up family later stating she didn't feel good and her sugar was dropping. Current cbg from monitor was 47. Pt has been having nausea and vomiting.

## 2024-03-26 NOTE — ED Provider Notes (Signed)
 [facility] Provider Note    Event Date/Time   First MD Initiated Contact with Patient 03/26/24 0031     (approximate)   History   Hypoglycemia   HPI  Ashley Camacho is a 33 y.o. female  33 y.o. female with medical history significant of IDDM, CKD stage IV, diabetic gastroparesis, adrenal insufficiency, chronic HFrEF with LVEF 25% in 2023, with insulin  pump who presents to the emergency department with nausea and an episode of hypoglycemia after consuming 2 THC Gummies.  Patient states that she was in her usual state of health and wanted to experience [facility] so she took a gummy.  When she did not experience any symptoms after 10 minutes she took a second gummy.  She subsequently became nauseous and received a low reading on whole glucose monitor and came to the emergency department.  Glucose on arrival was 52 and she was provided orange juice and currently her glucose is reading 65.  She feels improved.  Denies any headache, cough, shortness of breath chest pain abdominal pain or changes in urinary habits.  She states that she feels comfortable managing her OmniPod while in the emergency department      Physical Exam   Triage Vital Signs: ED Triage Vitals  Encounter Vitals Group     BP 03/26/24 0027 136/71     Girls Systolic BP Percentile --      Girls Diastolic BP Percentile --      Boys Systolic BP Percentile --      Boys Diastolic BP Percentile --      Pulse Rate 03/26/24 0027 90     Resp 03/26/24 0027 18     Temp 03/26/24 0027 98.1 F (36.7 C)     Temp src --      SpO2 03/26/24 0027 100 %     Weight 03/26/24 0027 115 lb (52.2 kg)     Height 03/26/24 0027 5' 1 (1.549 m)     Head Circumference --      Peak Flow --      Pain Score 03/26/24 0026 0     Pain Loc --      Pain Education --      Exclude from Growth Chart --     Most recent vital signs: Vitals:   03/26/24 0027 03/26/24 0510  BP: 136/71 123/75  Pulse: 90 80  Resp: 18 16  Temp:  98.1 F (36.7 C)   SpO2: 100% 100%    Nursing Triage Note reviewed. Vital signs reviewed and patients oxygen saturation is normoxic  General: Patient is well nourished, well developed, awake and alert, resting comfortably in no acute distress Head: Normocephalic and atraumatic Eyes: Normal inspection, extraocular muscles intact, no conjunctival pallor Ear, nose, throat: Normal external exam Neck: Normal range of motion Respiratory: Patient is in no respiratory distress, lungs CTAB Cardiovascular: Patient is not tachycardic, RRR without murmur appreciated GI: Abd SNT with no guarding or rebound  OmniPod attached to right anterior leg Back: Normal inspection of the back with good strength and range of motion throughout all ext Extremities: pulses intact with good cap refills, no LE pitting edema or calf tenderness Neuro: The patient is alert and oriented to person, place, and time, appropriately conversive, with 5/5 bilat UE/LE strength, no gross motor or sensory defects noted. Coordination appears to be adequate. Skin: Warm, dry, and intact Psych: normal mood and affect, no SI or HI  ED Results / Procedures / Treatments  Labs (all labs ordered are listed, but only abnormal results are displayed) Labs Reviewed  CBC WITH DIFFERENTIAL/PLATELET - Abnormal; Notable for the following components:      Result Value   RBC 3.38 (*)    Hemoglobin 10.6 (*)    HCT 32.6 (*)    Platelets 116 (*)    All other components within normal limits  BASIC METABOLIC PANEL WITH GFR - Abnormal; Notable for the following components:   BUN 36 (*)    Creatinine, Ser 2.07 (*)    Calcium  8.1 (*)    GFR, Estimated 32 (*)    All other components within normal limits  BLOOD GAS, VENOUS - Abnormal; Notable for the following components:   pH, Ven 7.22 (*)    pCO2, Ven 64 (*)    pO2, Ven <31 (*)    Acid-base deficit 2.8 (*)    All other components within normal limits  BLOOD GAS, VENOUS - Abnormal; Notable  for the following components:   Acid-base deficit 3.0 (*)    All other components within normal limits  CBG MONITORING, ED - Abnormal; Notable for the following components:   Glucose-Capillary 52 (*)    All other components within normal limits  CBG MONITORING, ED - Abnormal; Notable for the following components:   Glucose-Capillary 117 (*)    All other components within normal limits  CBG MONITORING, ED  POC URINE PREG, ED     EKG None  RADIOLOGY None    PROCEDURES:  Critical Care performed: No  Procedures   MEDICATIONS ORDERED IN ED: Medications  ondansetron  (ZOFRAN ) injection 4 mg (4 mg Intravenous Given 03/26/24 0154)  sodium chloride  0.9 % bolus 1,000 mL (0 mLs Intravenous Stopped 03/26/24 0514)  ipratropium-albuterol  (DUONEB) 0.5-2.5 (3) MG/3ML nebulizer solution 3 mL (3 mLs Nebulization Given 03/26/24 0256)     IMPRESSION / MDM / ASSESSMENT AND PLAN / ED COURSE                                Differential diagnosis includes, but is not limited to, hypoglycemia, THC use, DKA, electrolyte derangement   ED course: Patient is well-appearing and glucose improved with a dose of orange juice.  Patient is considered a brittle diabetic and even though her glucose is low there was concern for DKA given her nausea albeit could be THC induced.  Blood work was obtained which demonstrated no anion gap.  Patient's creatinine was 2.07 which is at her baseline.  She had no anemia or leukocytosis.  Surprisingly patient's first blood gas did demonstrate acidosis however seem to be mainly respiratory mediated.  She was given a DuoNeb and IV fluid and repeat blood gas demonstrated no acidosis.  Patient was counseled to abstain from any [facility] and she voiced understanding.  Her glucoses have improved and she feels comfortable returning home   Clinical Course as of 03/26/24 0740  Mon Mar 26, 2024  0038 Patient has insulin  pump attached and is managing her glucose.  Currently 66 that she  sips juice.  She is awake and alert and feels comfortable doing this.  Will obtain blood work [HD]  0226 WBC: 4.2 No leukocytosis [HD]  0227 Glucose-Capillary: 74 Improved [HD]  0238 Creatinine(!): 2.07 Baseline for the patient [HD]  0238 Anion gap: 8 No DKA [HD]  0246 pH, Ven(!): 7.22 Appears to be a respiratory acidosis which is surprising.  Patient has no history of asthma or  COPD but will give her a breathing treatment and a liter of fluid and recheck [HD]  0312 Anion gap: 8 [HD]  0501 Blood gas, venous(!) Improved [HD]    Clinical Course User Index [HD] Nicholaus Rolland BRAVO, MD   At time of discharge there is no evidence of acute life, limb, vision, or fertility threat. Patient has stable vital signs, pain is well controlled, patient is ambulatory and p.o. tolerant.  Discharge instructions were completed using the EPIC system. I would refer you to those at this time. All warnings prescriptions follow-up etc. were discussed in detail with the patient. Patient indicates understanding and is agreeable with this plan. All questions answered.  Patient is made aware that they may return to the emergency department for any worsening or new condition or for any other emergency.   -- Risk: 5 This patient has a high risk of morbidity due to further diagnostic testing or treatment. Rationale: This patient's evaluation and management involve a high risk of morbidity due to the potential severity of presenting symptoms, need for diagnostic testing, and/or initiation of treatment that may require close monitoring. The differential includes conditions with potential for significant deterioration or requiring escalation of care. Treatment decisions in the ED, including medication administration, procedural interventions, or disposition planning, reflect this level of risk. COPA: 5 The patient has the following acute or chronic illness/injury that poses a possible threat to life or bodily function: [X] :  The patient has a potentially serious acute condition or an acute exacerbation of a chronic illness requiring urgent evaluation and management in the Emergency Department. The clinical presentation necessitates immediate consideration of life-threatening or function-threatening diagnoses, even if they are ultimately ruled out.   FINAL CLINICAL IMPRESSION(S) / ED DIAGNOSES   Final diagnoses:  Nausea and vomiting, unspecified vomiting type  Cannabinoid hyperemesis syndrome     Rx / DC Orders   ED Discharge Orders     None        Note:  This document was prepared using Dragon voice recognition software and may include unintentional dictation errors.   Nicholaus Rolland BRAVO, MD 03/26/24 [phone]

## 2024-03-26 NOTE — Inpatient Diabetes Management (Signed)
 Inpatient Diabetes Program Recommendations  AACE/ADA: New Consensus Statement on Inpatient Glycemic Control (2015)  Target Ranges:  Prepandial:   less than 140 mg/dL      Peak postprandial:   less than 180 mg/dL (1-2 hours)      Critically ill patients:  140 - 180 mg/dL   Lab Results  Component Value Date   GLUCAP 92 03/26/2024   HGBA1C 8.0 (H) 10/09/2023    Inpatient Diabetes Program Recommendations:          Notified by MD that patient was wearing Omnipod insulin  pump.  Spoke with patient by phone. She states she  deactivated pod about 15 mins ago (1545). She will need basal insulin  ASAP.       Consider adding Lantus  6 units daily and Novolog  0-6 units tid with meals and HS (check blood sugar at 2 am) and add Novolog  meal coverage 2 units tid with meals (hold if patient eats less than 50%). She said that she just restarted her insulin  pump last week and was having lows since starting. Prior to going back on her pump, she took Lantus  11 units daily, 1 unit/20 grams of CHO and 1 unit drops her blood sugar approximately 70 mg/dL. She is extremely sensitive to insulin .  Patient is still wearing CGM and I encouraged her to let RN know if CBG drops. Patient verbalized understanding and is thankful for call.   Will follow.  MD states they will put in orders for SQ insulin .    Thanks,  Randall Bullocks, RN, BC-ADM Inpatient Diabetes Coordinator Pager [phone]  (8a-5p)

## 2024-03-26 NOTE — Inpatient Diabetes Management (Signed)
 Inpatient Diabetes Program Recommendations  AACE/ADA: New Consensus Statement on Inpatient Glycemic Control (2015)  Target Ranges:  Prepandial:   less than 140 mg/dL      Peak postprandial:   less than 180 mg/dL (1-2 hours)      Critically ill patients:  140 - 180 mg/dL   Lab Results  Component Value Date   GLUCAP 92 03/26/2024   HGBA1C 8.0 (H) 10/09/2023    Review of Glycemic Control  Latest Reference Range & Units 03/26/24 03:56 03/26/24 11:13 03/26/24 11:32 03/26/24 11:52  Glucose-Capillary 70 - 99 mg/dL 882 (H) 35 (LL) 50 (L) 92   Diabetes history: Type 1 DM Outpatient Diabetes medications:  Dexcom G7 Novolog  2 units tid with meals plus correction (0-6 units) Lantus  10 units daily Current orders for Inpatient glycemic control:  Received Decadron  8 mg x1 Glucagon 1 mg x 1  Inpatient Diabetes Program Recommendations:    Note history of Type 1 DM.  Please order CBG's q 4 hours and add Novolog  0-6 units q 4 hours once CBG>150 mg/dL. Also will need basal insulin  once CBG's stable to prevent DKA.   Thanks,  Randall Bullocks, RN, BC-ADM Inpatient Diabetes Coordinator Pager [phone]  (8a-5p)

## 2024-03-26 NOTE — H&P (Signed)
 History and Physical   [facility] - [place] @ [facility] Admission History and Physical [employer], D.O.    Patient Name: Ashley Camacho MR#: [id number] Date of Birth: 1990/11/05 Date of Admission: 03/26/2024  Referring MD/NP/PA: Dr. Aleene Rouse Primary Care Physician: Dineen Channel, PA-C  Chief Complaint:  Chief Complaint  Patient presents with   Hypoglycemia    HPI: Ashley Camacho is a 33 y.o. female with a known history of adrenal insufficiency, anemia, anxiety, CHF, CKD, diabetes, depression, gastroparesis, hypertension, hypothyroidism presents to the emergency department for evaluation of lightheadedness dizziness and hypoglycemia at home.  Patient was in a usual state of health until night last night when she took some THC Gummies.  She was seen in the emergency department last night and was discharged home after symptoms improved.  This morning she ate breakfast but continued to have symptoms which prompted her emergency department visit.  Her Dexcom showed that her blood sugar was dropping and she was experiencing some symptoms of weakness, dizziness, lightheadedness.   Otherwise there has been no change in status. Patient has been taking medication as prescribed and there has been no recent change in medication or diet.  No recent antibiotics.  There has been no recent illness, hospitalizations, travel or sick contacts.    EMS/ED Course: Patient received Decadron , Zofran , D5 normal saline bolus, D50 percent and continuous dextrose  5% infusion as well as glucagon. Medical admission has been requested for further management of symptomatic hypoglycemia.  Review of Systems:  CONSTITUTIONAL: Positive weakness, dizziness no fever/chills, fatigue, weight gain/loss, headache. EYES: No blurry or double vision. ENT: No tinnitus, postnasal drip, redness or soreness of the oropharynx. RESPIRATORY: No cough, dyspnea, wheeze.  No hemoptysis.  CARDIOVASCULAR: No chest pain,  palpitations, syncope, orthopnea. No lower extremity edema.  GASTROINTESTINAL: No nausea, vomiting, abdominal pain, diarrhea, constipation.  No hematemesis, melena or hematochezia. GENITOURINARY: No dysuria, frequency, hematuria. ENDOCRINE: Positive hypoglycemia as above HEMATOLOGY: No anemia, bruising, bleeding. INTEGUMENTARY: No rashes, ulcers, lesions. MUSCULOSKELETAL: No arthritis, gout. NEUROLOGIC: No numbness, tingling, ataxia, seizure-type activity, weakness. PSYCHIATRIC: No anxiety, depression, insomnia.   Past Medical History:  Diagnosis Date   Acute metabolic encephalopathy 06/10/2022   Adrenal insufficiency    Anemia    Anxiety    Blood transfusion without reported diagnosis    CHF (congestive heart failure) (HCC)    Chronic kidney disease    Depression    Gastroparesis    Hypertension    Hypotension    Intractable nausea and vomiting 11/13/2022   Migraine    Thyroid  disease    Type 1 diabetes [facility])     Past Surgical History:  Procedure Laterality Date   BIOPSY  01/14/2021   Procedure: BIOPSY;  Surgeon: Eda Iha, MD;  Location[facility] ENDOSCOPY;  Service: Gastroenterology;;   CENTRAL LINE INSERTION N/A 10/10/2023   Procedure: CENTRAL LINE INSERTION;  Surgeon: Marea Selinda RAMAN, MD;  Location: [facility] INVASIVE CV LAB;  Service: Cardiovascular;  Laterality: N/A;   CESAREAN SECTION     x2   CESAREAN SECTION WITH BILATERAL TUBAL LIGATION N/A 02/17/2021   Procedure: CESAREAN SECTION WITH BILATERAL TUBAL LIGATION;  Surgeon: Barbra Lang PARAS, DO;  Location: [facility] LD ORS;  Service: Obstetrics;  Laterality: N/A;   ESOPHAGOGASTRODUODENOSCOPY (EGD) WITH PROPOFOL  N/A 01/14/2021   Procedure: ESOPHAGOGASTRODUODENOSCOPY (EGD) WITH PROPOFOL ;  Surgeon: Eda Iha, MD;  Location: [facility] ENDOSCOPY;  Service: Gastroenterology;  Laterality: N/A;   IR BONE MARROW BIOPSY & ASPIRATION  12/16/2022   IR REPLC DUODEN/JEJUNO TUBE  PERCUT W/FLUORO  11/22/2022   JEJUNOSTOMY N/A 06/13/2022    Procedure: DEXTER;  Surgeon: Jordis Laneta FALCON, MD;  Location: [facility] ORS;  Service: General;  Laterality: N/A;   MOUTH SURGERY     TEE WITHOUT CARDIOVERSION N/A 01/06/2024   Procedure: ECHOCARDIOGRAM, TRANSESOPHAGEAL;  Surgeon: Rolan Ezra RAMAN, MD;  Location: [facility] ORS;  Service: Cardiovascular;  Laterality: N/A;   TUBAL LIGATION       reports that she has never smoked. She has never been exposed to tobacco smoke. She has never used smokeless tobacco. She reports that she does not drink alcohol and does not use drugs.  No Known Allergies  Family History  Problem Relation Age of Onset   Breast cancer Mother    Seizures Mother    Cancer Mother    Diabetes type I Father    CAD Father    Diabetes Father    Early death Father    Lung cancer Maternal Grandfather    CAD Paternal Grandmother    Diabetes Paternal Grandmother    CAD Paternal Grandfather    Diabetes Paternal Grandfather    ADD / ADHD Daughter    Diabetes Maternal Uncle    Ovarian cancer Neg Hx     Prior to Admission medications   Medication Sig Start Date End Date Taking? Authorizing Provider  isosorbide  mononitrate (IMDUR ) 30 MG 24 hr tablet Take 15 mg by mouth daily. 02/29/24  Yes [provider]  Accu-Chek Softclix Lancets lancets Use to check blood sugar up to 6 times daily. May substitute to any manufacturer covered by patient's insurance. 01/19/24   Ostwalt, [name], PA-C  acetaminophen  (TYLENOL ) 500 MG tablet Take 2 tablets (1,000 mg total) by mouth every 6 (six) hours as needed for headache, fever or moderate pain. 08/05/22   Dorinda Drue DASEN, MD  BAQSIMI ONE PACK 3 MG/DOSE POWD Place 3 mg into the nose as needed. 06/09/23   [provider]  BD PEN NEEDLE MINI ULTRAFINE 31G X 5 MM MISC by Other route. 07/18/23 07/17/24  [provider]  Blood Glucose Monitoring Suppl (BLOOD GLUCOSE MONITOR SYSTEM) w/Device KIT Use to check blood sugar up to 6 times daily. May substitute to any manufacturer covered  by patient's insurance. 01/19/24   Ostwalt, [name], PA-C  Continuous Glucose Sensor (DEXCOM G7 SENSOR) MISC Use to check blood sugar. Change every 10 days 01/19/24   Ostwalt, [name], PA-C  divalproex  (DEPAKOTE ) 250 MG DR tablet Take 250 mg by mouth in the morning.    [provider]  furosemide  (LASIX ) 20 MG tablet Take 1 tablet (20 mg total) by mouth daily as needed. 02/17/24 05/17/24  Donette City A, FNP  Glucose Blood (BLOOD GLUCOSE TEST STRIPS) STRP Use to check blood sugar up to 6 times daily. May substitute to any manufacturer covered by patient's insurance. 01/19/24   Ostwalt, [name], PA-C  hydrocortisone  (CORTEF ) 10 MG tablet Take 2 tablets (20 mg total) by mouth daily with breakfast AND 1 tablet (10 mg total) daily with supper. Patient taking differently: Take 1.5 tablets (15 mg total) by mouth daily with breakfast AND 0.5 tablet (5 mg total) daily with supper. 05/27/23   [name], MD  insulin  aspart (NOVOLOG ) 100 UNIT/ML FlexPen Inject 2 Units into the skin 3 (three) times daily with meals. If eating and Blood Glucose (BG) 80 or higher inject 2 units for meal coverage and add correction dose per scale. If not eating, correction dose only. BG <150= 0 unit; BG 150-200= 1  unit; BG 201-250= 3 unit; BG 251-300= 5 unit; BG 301-350= 7 unit; BG 351-400= 9 unit; BG >400= 11 unit and Call Primary Care. 10/12/23   [name], MD  insulin  glargine (LANTUS ) 100 UNIT/ML Solostar Pen Inject 10 Units into the skin daily. 10/12/23   [name], MD  Insulin  Pen Needle (PEN NEEDLES) 31G X 6 MM MISC PEN NEEDLES 31G X 6 MM 05/31/17   [provider]  Lancet Device MISC Use to check blood sugar up to 6 times daily. May substitute to any manufacturer covered by patient's insurance. 01/19/24   Ostwalt, [name], PA-C  levothyroxine  (SYNTHROID ) 75 MCG tablet TAKE 1 TABLET (75 MCG TOTAL) BY MOUTH DAILY AT 6 (SIX) AM. 12/06/23   Ostwalt, [name], PA-C  metoprolol  succinate (TOPROL -XL) 25 MG 24 hr  tablet TAKE 1 TABLET (25 MG TOTAL) BY MOUTH DAILY. 03/23/24   Rolan Ezra RAMAN, MD  Pancrelipase , Lip-Prot-Amyl, 24000-76000 units CPEP Take 1 capsule (24,000 Units total) by mouth 3 (three) times daily before meals. 11/04/23   [name], MD  rosuvastatin  (CRESTOR ) 10 MG tablet Take 1 tablet (10 mg total) by mouth daily. 01/18/24   Donette Ellouise LABOR, FNP  sacubitril -valsartan  (ENTRESTO ) 24-26 MG Take 1 tablet by mouth 2 (two) times daily. 02/28/24   Rolan Ezra RAMAN, MD  sodium zirconium cyclosilicate  (LOKELMA ) 10 g PACK packet Take 10 g by mouth daily. 02/28/24   Rolan Ezra RAMAN, MD    Physical Exam: Vitals:   03/26/24 1108 03/26/24 1109  BP: (!) 140/75   Pulse: 88   Resp: 18   Temp: 98 F (36.7 C)   SpO2: 100%   Weight:  52.2 kg  Height:  5' 1 (1.549 m)    GENERAL: 33 y.o.-year-old white female patient, chronically ill-appearing but no acute distress.  Pleasant and cooperative.   HEENT: Pale.  Head atraumatic, normocephalic. Pupils equal. Mucus membranes dry NECK: Supple. No JVD. CHEST: Normal respiratory effort.  Clear breath sounds bilaterally.   CARDIOVASCULAR: S1, S2 normal. No murmurs, rubs, or gallops. Cap refill <2 seconds. Pulses intact distally.  ABDOMEN: Soft, nondistended, nontender.  EXTREMITIES: No pedal edema NEUROLOGIC: The patient is alert and oriented x 3. PSYCHIATRIC:  Normal affect, mood, thought content. SKIN: Warm, dry, and intact without obvious rash, lesion, or ulcer.    Labs on Admission:  CBC: Recent Labs  Lab 03/26/24 0155 03/26/24 1143  WBC 4.2 3.7*  NEUTROABS 2.5  --   HGB 10.6* 8.0*  HCT 32.6* 24.8*  MCV 96.4 96.5  PLT 116* 123*   Basic Metabolic Panel: Recent Labs  Lab 03/26/24 0155 03/26/24 1143  NA 142 142  K 4.6 4.4  CL 111 111  CO2 23 24  GLUCOSE 76 113*  BUN 36* 36*  CREATININE 2.07* 2.01*  CALCIUM  8.1* 8.1*   GFR: Estimated Creatinine Clearance: 30.3 mL/min (A) (by C-G formula based on SCr of 2.01 mg/dL (H)). Liver  Function Tests: No results for input(s): AST, ALT, ALKPHOS, BILITOT, PROT, ALBUMIN  in the last 168 hours. No results for input(s): LIPASE, AMYLASE in the last 168 hours. No results for input(s): AMMONIA in the last 168 hours. Coagulation Profile: No results for input(s): INR, PROTIME in the last 168 hours. Cardiac Enzymes: No results for input(s): CKTOTAL, CKMB, CKMBINDEX, TROPONINI in the last 168 hours. BNP (last 3 results) Recent Labs    12/21/23 1438  PROBNP >70000*   HbA1C: No results for input(s): HGBA1C in the last 72 hours. CBG: Recent Labs  Lab  03/26/24 0105 03/26/24 0356 03/26/24 1113 03/26/24 1132 03/26/24 1152  GLUCAP 74 117* 35* 50* 92   Lipid Profile: No results for input(s): CHOL, HDL, LDLCALC, TRIG, CHOLHDL, LDLDIRECT in the last 72 hours. Thyroid  Function Tests: No results for input(s): TSH, T4TOTAL, FREET4, T3FREE, THYROIDAB in the last 72 hours. Anemia Panel: No results for input(s): VITAMINB12, FOLATE, FERRITIN, TIBC, IRON , RETICCTPCT in the last 72 hours. Urine analysis:    Component Value Date/Time   COLORURINE STRAW (A) 02/09/2024 1459   APPEARANCEUR CLEAR (A) 02/09/2024 1459   APPEARANCEUR Clear 12/21/2023 1438   LABSPEC 1.013 02/09/2024 1459   LABSPEC 1.026 03/01/2013 1844   PHURINE 7.0 02/09/2024 1459   GLUCOSEU >=500 (A) 02/09/2024 1459   GLUCOSEU >=500 03/01/2013 1844   HGBUR SMALL (A) 02/09/2024 1459   BILIRUBINUR NEGATIVE 02/09/2024 1459   BILIRUBINUR Negative 12/21/2023 1438   BILIRUBINUR Negative 03/01/2013 1844   KETONESUR NEGATIVE 02/09/2024 1459   PROTEINUR 100 (A) 02/09/2024 1459   UROBILINOGEN 0.2 10/23/2020 0953   NITRITE NEGATIVE 02/09/2024 1459   LEUKOCYTESUR NEGATIVE 02/09/2024 1459   LEUKOCYTESUR Negative 03/01/2013 1844   Sepsis Labs: @LABRCNTIP (procalcitonin:4,lacticidven:4) )No results found for this or any previous visit (from the past 240 hours).    Radiological Exams on Admission: No results found.  EKG: Normal sinus rhythm at 78 bpm with normal axis and nonspecific ST-T wave changes.   Assessment/Plan  This is a 33 y.o. female with a history of adrenal insufficiency, anemia, anxiety, CHF, CKD, diabetes, depression, gastroparesis, hypertension, hypothyroidism  now being admitted with:  #.  Hypoglycemia, history of DM1  -Admit observation - Continue dextrose  infusion - CBGs regularly - Reinstitute insulin  sliding scale and Lantus  once blood sugars have stabilized.  #.  Anemia of chronic disease, acute on chronic, normocytic - Check iron  panel, check B12 folate - Check FOBT - Trend CBC Transfuse as needed  #.  History of adrenal insufficiency Continue hydrocortisone  - Continue pancrelipase   #.  History of CHF, chronic combined systolic and diastolic -Most recent LVEF 35 to 40% in August 2025 - Continue Imdur , metoprolol , rosuvastatin , Entresto   #. History of hypothyroidism - Continue Synthroid  -Check TSH  #. History of CKD 3 - Continue Lokelma    Admission status: Observation IV Fluids: D5 Diet/Nutrition: Heart healthy carb controlled Consults called: None DVT Px: SCDs and early ambulation. Code Status: Full Code  Disposition Plan: To home in 1-2 days  All the records are reviewed and case discussed with ED provider. Management plans discussed with the patient and/or family who express understanding and agree with plan of care.  Xue Low D.O. on 03/26/2024 at 2:08 PM CC: Primary care physician; Dineen Channel, PA-C   03/26/2024, 2:08 PM

## 2024-03-27 DIAGNOSIS — E2740 Unspecified adrenocortical insufficiency: Secondary | ICD-10-CM

## 2024-03-27 DIAGNOSIS — E10649 Type 1 diabetes mellitus with hypoglycemia without coma: Principal | ICD-10-CM

## 2024-03-27 DIAGNOSIS — R112 Nausea with vomiting, unspecified: Secondary | ICD-10-CM

## 2024-03-27 LAB — COMPREHENSIVE METABOLIC PANEL WITH GFR
ALT: 12 U/L (ref 0–44)
AST: 26 U/L (ref 15–41)
Albumin: 3.1 g/dL — ABNORMAL LOW (ref 3.5–5.0)
Alkaline Phosphatase: 46 U/L (ref 38–126)
Anion gap: 10 (ref 5–15)
BUN: 46 mg/dL — ABNORMAL HIGH (ref 6–20)
CO2: 19 mmol/L — ABNORMAL LOW (ref 22–32)
Calcium: 8.5 mg/dL — ABNORMAL LOW (ref 8.9–10.3)
Chloride: 113 mmol/L — ABNORMAL HIGH (ref 98–111)
Creatinine, Ser: 2.13 mg/dL — ABNORMAL HIGH (ref 0.44–1.00)
GFR, Estimated: 31 mL/min — ABNORMAL LOW (ref >60)
Glucose, Bld: 208 mg/dL — ABNORMAL HIGH (ref 70–99)
Potassium: 5.8 mmol/L — ABNORMAL HIGH (ref 3.5–5.1)
Sodium: 142 mmol/L (ref 135–145)
Total Bilirubin: 0.4 mg/dL (ref 0.0–1.2)
Total Protein: 5.9 g/dL — ABNORMAL LOW (ref 6.5–8.1)

## 2024-03-27 LAB — GLUCOSE, CAPILLARY
Glucose-Capillary: 499 mg/dL — ABNORMAL HIGH (ref 70–99)
Glucose-Capillary: 62 mg/dL — ABNORMAL LOW (ref 70–99)
Glucose-Capillary: 65 mg/dL — ABNORMAL LOW (ref 70–99)
Glucose-Capillary: 168 mg/dL — ABNORMAL HIGH (ref 70–99)

## 2024-03-27 LAB — CBC
HCT: 23.5 % — ABNORMAL LOW (ref 36.0–46.0)
HCT: 24.7 % — ABNORMAL LOW (ref 36.0–46.0)
Hemoglobin: 8 g/dL — ABNORMAL LOW (ref 12.0–15.0)
Hemoglobin: 8.1 g/dL — ABNORMAL LOW (ref 12.0–15.0)
MCH: 31.2 pg (ref 26.0–34.0)
MCH: 31.7 pg (ref 26.0–34.0)
MCHC: 32.8 g/dL (ref 30.0–36.0)
MCHC: 34 g/dL (ref 30.0–36.0)
MCV: 93.3 fL (ref 80.0–100.0)
MCV: 95 fL (ref 80.0–100.0)
Platelets: 134 K/uL — ABNORMAL LOW (ref 150–400)
Platelets: 149 K/uL — ABNORMAL LOW (ref 150–400)
RBC: 2.52 MIL/uL — ABNORMAL LOW (ref 3.87–5.11)
RBC: 2.6 MIL/uL — ABNORMAL LOW (ref 3.87–5.11)
RDW: 12.3 % (ref 11.5–15.5)
RDW: 12.4 % (ref 11.5–15.5)
WBC: 10.2 K/uL (ref 4.0–10.5)
WBC: 6 K/uL (ref 4.0–10.5)
nRBC: 0 % (ref 0.0–0.2)
nRBC: 0 % (ref 0.0–0.2)

## 2024-03-27 LAB — VITAMIN B12: Vitamin B-12: 373 pg/mL (ref 180–914)

## 2024-03-27 MED ORDER — SACUBITRIL-VALSARTAN 24-26 MG PO TABS: 1.0000 | ORAL_TABLET | Freq: Two times a day (BID) | ORAL | Status: DC

## 2024-03-27 NOTE — Inpatient Diabetes Management (Signed)
 Inpatient Diabetes Program Recommendations  AACE/ADA: New Consensus Statement on Inpatient Glycemic Control (2015)  Target Ranges:  Prepandial:   less than 140 mg/dL      Peak postprandial:   less than 180 mg/dL (1-2 hours)      Critically ill patients:  140 - 180 mg/dL   Lab Results  Component Value Date   GLUCAP 62 (L) 03/27/2024   HGBA1C 8.0 (H) 10/09/2023    Review of Glycemic Control  Latest Reference Range & Units 03/26/24 16:19 03/26/24 20:44 03/27/24 01:56 03/27/24 08:24  Glucose-Capillary 70 - 99 mg/dL 99 585 (H) 500 (H) 62 (L)   Diabetes history: DM 1 Outpatient Diabetes medications:  Omnipod insulin  pump (restarted a couple weeks ago) Prior to pump Lantus  10 units daily, Novolog  1 unit/75 mg/dL>125 mg/dL, and Novolog  1 unit/20 grams of CHO Current orders for Inpatient glycemic control:  Lantus  6 units daily Novolog  0-6 units tid with meals and HS Novolog  2 units tid with meals  Inpatient Diabetes Program Recommendations:    Insulin  pump was stopped yesterday due to continued hypoglycemia.  Orders were in place for basal insulin , Novolog  meal coverage and Novolog  correction.  Semglee  6 units was documented as not given by RN.  CBG up to 414 by 2044- Novolog  5 units given at 2300.  CBG rechecked at 0156 and was 499- Novolog  5 units given at 0328.  CBG at 0844 62 mg/dL.    -Patient needs basal insulin  since she is a Type 1 and does not make insulin .  May need custom scale for Novolog  correction and meal coverage to ensure that she does not get too much or too little insulin .  Following closely.   Thanks,  Randall Bullocks, RN, BC-ADM Inpatient Diabetes Coordinator Pager [phone]  (8a-5p)

## 2024-03-27 NOTE — Plan of Care (Signed)
   Problem: Education: Goal: Knowledge of General Education information will improve Description: Including pain rating scale, medication(s)/side effects and non-pharmacologic comfort measures Outcome: Progressing   Problem: Health Behavior/Discharge Planning: Goal: Ability to manage health-related needs will improve Outcome: Progressing   Problem: Clinical Measurements: Goal: Ability to maintain clinical measurements within normal limits will improve Outcome: Progressing Goal: Will remain free from infection Outcome: Progressing Goal: Diagnostic test results will improve Outcome: Progressing Goal: Respiratory complications will improve Outcome: Progressing Goal: Cardiovascular complication will be avoided Outcome: Progressing   Problem: Nutrition: Goal: Adequate nutrition will be maintained Outcome: Progressing   Problem: Coping: Goal: Level of anxiety will decrease Outcome: Progressing   Problem: Elimination: Goal: Will not experience complications related to bowel motility Outcome: Progressing Goal: Will not experience complications related to urinary retention Outcome: Progressing

## 2024-03-27 NOTE — Hospital Course (Addendum)
 Taken from H&P.  Ashley Camacho is a 33 y.o. female with a known history of adrenal insufficiency, anemia, anxiety, CHF, CKD, diabetes, depression, gastroparesis, hypertension, hypothyroidism presents to the emergency department for evaluation of lightheadedness dizziness and hypoglycemia at home.  Patient was in a usual state of health until night last night when she took some THC Gummies.  She was seen in the emergency department last night and was discharged home after symptoms improved.  This morning she ate breakfast but continued to have symptoms which prompted her emergency department visit.  Patient was hypoglycemic at 52 on presentation, which further decreased to 35 after initial improvement with D50 so she was started on D5, also received glucagon and Decadron .  11/4: A.m. labs with hyperglycemia and hyperkalemia patient, received insulin  and blood sugar decreased to 62.  She had quite brittle diabetes.  Also ordered Lokelma  and holding Entresto .  Seems like patient has quite brittle diabetes.  Per patient she never experienced this issue before placing insulin  pump which is new.  We advised patient to continue managing her diabetes as she was doing it before and keep the insulin  pump turned off until she see her endocrinologist and they can adjust her settings.  Patient will continue with Lokelma , apparently she was taking it on a daily basis at home before.  We held the dose of Entresto  today but she can resume on discharge and need close monitoring by her providers.  She will continue with her home medications, will manage her insulin  as she was doing it before placing pump and follow-up with her doctors for further assistance.

## 2024-03-27 NOTE — Discharge Summary (Signed)
 Physician Discharge Summary   Patient: Ashley Camacho MRN: [id number] DOB: [birth year]  Admit date:     03/26/2024  Discharge date: 03/27/24  Discharge Physician: Amaryllis Dare   PCP: Ostwalt, [name], PA-C   Recommendations at discharge:  Please obtain CBC and BMP on follow-up Follow-up with endocrinology Follow-up with PCP  Discharge Diagnoses: Principal Problem:   Hypoglycemia Active Problems:   Adrenal insufficiency   Hospital Course: Taken from H&P.  Ashley Camacho is a 33 y.o. female with a known history of adrenal insufficiency, anemia, anxiety, CHF, CKD, diabetes, depression, gastroparesis, hypertension, hypothyroidism presents to the emergency department for evaluation of lightheadedness dizziness and hypoglycemia at home.  Patient was in a usual state of health until night last night when she took some THC Gummies.  She was seen in the emergency department last night and was discharged home after symptoms improved.  This morning she ate breakfast but continued to have symptoms which prompted her emergency department visit.  Patient was hypoglycemic at 52 on presentation, which further decreased to 35 after initial improvement with D50 so she was started on D5, also received glucagon and Decadron .  11/4: A.m. labs with hyperglycemia and hyperkalemia patient, received insulin  and blood sugar decreased to 62.  She had quite brittle diabetes.  Also ordered Lokelma  and holding Entresto .  Seems like patient has quite brittle diabetes.  Per patient she never experienced this issue before placing insulin  pump which is new.  We advised patient to continue managing her diabetes as she was doing it before and keep the insulin  pump turned off until she see her endocrinologist and they can adjust her settings.  Patient will continue with Lokelma , apparently she was taking it on a daily basis at home before.  We held the dose of Entresto  today but she can resume on discharge and need close  monitoring by her providers.  She will continue with her home medications, will manage her insulin  as she was doing it before placing pump and follow-up with her doctors for further assistance.   Consultants: None Procedures performed: None Disposition: Home Diet recommendation:  Discharge Diet Orders (From admission, onward)     Start     Ordered   03/27/24 0000  Diet - low sodium heart healthy        03/27/24 1046           Cardiac and Carb modified diet DISCHARGE MEDICATION: Allergies as of 03/27/2024   No Known Allergies      Medication List     STOP taking these medications    Baqsimi One Pack 3 MG/DOSE Powd Generic drug: Glucagon       TAKE these medications    Accu-Chek Guide Test test strip Generic drug: glucose blood Use to check blood sugar up to 6 times daily. May substitute to any manufacturer covered by patient's insurance.   Accu-Chek Guide w/Device Kit Use to check blood sugar up to 6 times daily. May substitute to any manufacturer covered by patient's insurance.   Accu-Chek Softclix Lancets lancets Use to check blood sugar up to 6 times daily. May substitute to any manufacturer covered by patient's insurance.   acetaminophen  500 MG tablet Commonly known as: TYLENOL  Take 2 tablets (1,000 mg total) by mouth every 6 (six) hours as needed for headache, fever or moderate pain.   Creon  24000-76000 units Cpep Generic drug: Pancrelipase  (Lip-Prot-Amyl) Take 1 capsule (24,000 Units total) by mouth 3 (three) times daily before meals.   Dexcom G7 Sensor  Misc Use to check blood sugar. Change every 10 days   divalproex  250 MG DR tablet Commonly known as: DEPAKOTE  Take 250 mg by mouth in the morning.   furosemide  20 MG tablet Commonly known as: LASIX  Take 1 tablet (20 mg total) by mouth daily as needed.   hydrocortisone  10 MG tablet Commonly known as: CORTEF  Take 2 tablets (20 mg total) by mouth daily with breakfast AND 1 tablet (10 mg total)  daily with supper. What changed: See the new instructions.   insulin  aspart 100 UNIT/ML FlexPen Commonly known as: NOVOLOG  Inject 2 Units into the skin 3 (three) times daily with meals. If eating and Blood Glucose (BG) 80 or higher inject 2 units for meal coverage and add correction dose per scale. If not eating, correction dose only. BG <150= 0 unit; BG 150-200= 1 unit; BG 201-250= 3 unit; BG 251-300= 5 unit; BG 301-350= 7 unit; BG 351-400= 9 unit; BG >400= 11 unit and Call Primary Care.   insulin  glargine 100 UNIT/ML Solostar Pen Commonly known as: LANTUS  Inject 10 Units into the skin daily.   isosorbide  mononitrate 30 MG 24 hr tablet Commonly known as: IMDUR  Take 15 mg by mouth daily.   Lancet Device Misc Use to check blood sugar up to 6 times daily. May substitute to any manufacturer covered by patient's insurance.   levothyroxine  75 MCG tablet Commonly known as: SYNTHROID  TAKE 1 TABLET (75 MCG TOTAL) BY MOUTH DAILY AT 6 (SIX) AM.   Lokelma  10 g Pack packet Generic drug: sodium zirconium cyclosilicate  Take 10 g by mouth daily.   metoprolol  succinate 25 MG 24 hr tablet Commonly known as: TOPROL -XL TAKE 1 TABLET (25 MG TOTAL) BY MOUTH DAILY.   Pen Needles 31G X 6 MM Misc PEN NEEDLES 31G X 6 MM   BD Pen Needle Mini Ultrafine 31G X 5 MM Misc Generic drug: Insulin  Pen Needle by Other route.   rosuvastatin  10 MG tablet Commonly known as: CRESTOR  Take 1 tablet (10 mg total) by mouth daily.   sacubitril -valsartan  24-26 MG Commonly known as: Entresto  Take 1 tablet by mouth 2 (two) times daily.        Follow-up Information     Ostwalt, [name], PA-C. Go on 03/28/2024.   Specialty: Physician Assistant Why: Appt @ 2:00 pm Contact information: [address] [place] [address] [phone]                Discharge Exam: [name]   03/26/24 1109  Weight: 52.2 kg   General.  Well-developed lady, in no acute distress. Pulmonary.  Lungs clear  bilaterally, normal respiratory effort. CV.  Regular rate and rhythm, no JVD, rub or murmur. Abdomen.  Soft, nontender, nondistended, BS positive. CNS.  Alert and oriented .  No focal neurologic deficit. Extremities.  No edema, no cyanosis, pulses intact and symmetrical. Psychiatry.  Judgment and insight appears normal.   Condition at discharge: stable  The results of significant diagnostics from this hospitalization (including imaging, microbiology, ancillary and laboratory) are listed below for reference.   Imaging Studies: No results found.  Microbiology: Results for orders placed or performed during the hospital encounter of 01/02/24  MRSA Next Gen by PCR, Nasal     Status: None   Collection Time: 01/05/24  5:47 PM   Specimen: Nasal Mucosa; Nasal Swab  Result Value Ref Range Status   MRSA by PCR Next Gen NOT DETECTED NOT DETECTED Final    Comment: (NOTE) The GeneXpert MRSA Assay (FDA  approved for NASAL specimens only), is one component of a comprehensive MRSA colonization surveillance program. It is not intended to diagnose MRSA infection nor to guide or monitor treatment for MRSA infections. Test performance is not FDA approved in patients less than 8 years old. Performed at [facility], [address] [place], [place] [address]     Labs: CBC: Recent Labs  Lab 03/26/24 0155 03/26/24 1143 03/27/24 0007 03/27/24 0555  WBC 4.2 3.7* 6.0 10.2  NEUTROABS 2.5  --   --   --   HGB 10.6* 8.0* 8.0* 8.1*  HCT 32.6* 24.8* 23.5* 24.7*  MCV 96.4 96.5 93.3 95.0  PLT 116* 123* 134* 149*   Basic Metabolic Panel: Recent Labs  Lab 03/26/24 0155 03/26/24 1143 03/26/24 1856 03/27/24 0555  NA 142 142  --  142  K 4.6 4.4  --  5.8*  CL 111 111  --  113*  CO2 23 24  --  19*  GLUCOSE 76 113*  --  208*  BUN 36* 36*  --  46*  CREATININE 2.07* 2.01*  --  2.13*  CALCIUM  8.1* 8.1*  --  8.5*  MG  --   --  1.6*  --   PHOS  --   --  3.1  --    Liver Function  Tests: Recent Labs  Lab 03/27/24 0555  AST 26  ALT 12  ALKPHOS 46  BILITOT 0.4  PROT 5.9*  ALBUMIN  3.1*   CBG: Recent Labs  Lab 03/26/24 2044 03/27/24 0156 03/27/24 0824 03/27/24 0844 03/27/24 0946  GLUCAP 414* 499* 62* 65* 168*    Discharge time spent: greater than 30 minutes.  This record has been created using Conservation officer, historic buildings. Errors have been sought and corrected,but may not always be located. Such creation errors do not reflect on the standard of care.   Signed: Amaryllis Dare, MD [facility] 03/27/2024

## 2024-03-28 ENCOUNTER — Inpatient Hospital Stay: Admitting: Physician Assistant

## 2024-03-28 ENCOUNTER — Telehealth: Payer: Self-pay

## 2024-03-28 DIAGNOSIS — Z1331 Encounter for screening for depression: Secondary | ICD-10-CM | POA: Diagnosis not present

## 2024-03-28 DIAGNOSIS — G43E09 Chronic migraine with aura, not intractable, without status migrainosus: Secondary | ICD-10-CM | POA: Diagnosis not present

## 2024-03-28 LAB — BLOOD GAS, VENOUS
Bicarbonate: 26.2 mmol/L — AB (ref 20.0–28.0)
O2 Saturation: 31.5 % — AB (ref 0.0–2.0)
Patient temperature: 37
Patient temperature: 37 %
pCO2, Ven: 64 mmHg — ABNORMAL HIGH (ref 44–60)
pH, Ven: 7.22 — ABNORMAL LOW (ref 7.25–7.43)
pO2, Ven: <31 mmHg — AB (ref 32–45)

## 2024-03-28 NOTE — Transitions of Care (Post Inpatient/ED Visit) (Unsigned)
   03/28/2024  Name: Ashley Camacho MRN: [id number] DOB: Feb 27, 1991  Today's TOC FU Call Status: Today's TOC FU Call Status:: Unsuccessful Call (1st Attempt) Unsuccessful Call (1st Attempt) Date: 03/28/24  Attempted to reach the patient regarding the most recent Inpatient/ED visit.  Follow Up Plan: Additional outreach attempts will be made to reach the patient to complete the Transitions of Care (Post Inpatient/ED visit) call.   Signature Julian Lemmings, LPN [facility] Nurse Health Advisor Direct Dial [phone]

## 2024-03-29 NOTE — Progress Notes (Signed)
 Complex Care Management Note Care Guide Note  03/29/2024 Name: Ashley Camacho MRN: [id number] DOB: 11/30/1990   Complex Care Management Outreach Attempts: A third unsuccessful outreach was attempted today to offer the patient with information about available complex care management services.  Follow Up Plan:  No further outreach attempts will be made at this time. We have been unable to contact the patient to offer or enroll patient in complex care management services.  Encounter Outcome:  No Answer  Thedford Franks, CMA [place]  [facility], [facility] Guide Direct Dial: [phone]  Fax: [phone] Website: [email or web address]

## 2024-03-29 NOTE — Transitions of Care (Post Inpatient/ED Visit) (Signed)
   03/29/2024  Name: Ashley Camacho MRN: [id number] DOB: 03-31-1991  Today's TOC FU Call Status: Today's TOC FU Call Status:: Unsuccessful Call (1st Attempt) Unsuccessful Call (1st Attempt) Date: 03/28/24  Attempted to reach the patient regarding the most recent Inpatient/ED visit.  Follow Up Plan: No further outreach attempts will be made at this time. We have been unable to contact the patient. Patient already seen Signature  Julian Lemmings, LPN [facility] Nurse Health Advisor Direct Dial [phone]

## 2024-03-30 DIAGNOSIS — E310 Autoimmune polyglandular failure: Secondary | ICD-10-CM | POA: Diagnosis not present

## 2024-03-30 DIAGNOSIS — E1042 Type 1 diabetes mellitus with diabetic polyneuropathy: Secondary | ICD-10-CM | POA: Diagnosis not present

## 2024-03-30 DIAGNOSIS — E2740 Unspecified adrenocortical insufficiency: Secondary | ICD-10-CM | POA: Diagnosis not present

## 2024-03-30 DIAGNOSIS — E063 Autoimmune thyroiditis: Secondary | ICD-10-CM | POA: Diagnosis not present

## 2024-04-04 DIAGNOSIS — Z419 Encounter for procedure for purposes other than remedying health state, unspecified: Secondary | ICD-10-CM | POA: Diagnosis not present

## 2024-04-05 ENCOUNTER — Ambulatory Visit: Admitting: Cardiology

## 2024-04-06 ENCOUNTER — Telehealth: Payer: Self-pay | Admitting: Family

## 2024-04-06 NOTE — Telephone Encounter (Signed)
 Called to confirm/remind patient of their appointment at the [facility] on 04/09/24.   Appointment:   [x] Confirmed  [] Left mess   [] No answer/No voice mail  [] VM Full/unable to leave message  [] Phone not in service  Patient reminded to bring all medications and/or complete list.  Confirmed patient has transportation. Gave directions, instructed to utilize valet parking.

## 2024-04-08 NOTE — Progress Notes (Unsigned)
 [facility] Note   Referring Physician: PCP: Ostwalt, [name], PA-C Cardiologist: Redell Cave, MD  HF Cardiology: Dr Rolan  Chief Complaint: shortness of breath   HPI:  Ashley Camacho is a 33 y.o. female with a history of type 1 diabetes, CKD stage 3/diabetic nephropathy, diabetic gastroparesis, adrenal insufficiency, and CHF was referred by Dr. Claudene in the [facility] ER for evaluation of CHF.  She has a rather complex history.  Echo in 7/22 after the birth of her second daughter showed EF 40-45%.  This had declined to 25% by 2/23, ?peripartum CMP.  Cath in 2/23 at [facility] showed no significant CAD.  She had AKI in 3/23 requiring transient dialysis, possible acute interstitial nephritis.  She also had Enterococcal bacteremia that admission in 3/23. She has numerous diabetic complications including gastroparesis, nephropathy, and neuropathy.    She was hospitalized at [facility] in 6/25 for possible PNA.  She was seen in the ER 12/22/23 with dyspnea and LE edema. She says the edema had been present since 6/25 admission and she think she got too much IVF in the hospital. LE US  showed not DVT.  CXR showed vascular congestion. BNP was severely elevated.  Interestingly, patient had had an echo in 6/25 at [facility] that I reviewed; this was fairly normal with EF 55-60%, GLS -23.4%, normal diastolic function, RV normal, mild-moderate MR, IVC normal. Echo did not match clinical picture.  Patient was started on Lasix  20 mg daily when she was sent home from the ER.   Patient was admitted in 8/25 with CHF. She was diuresed with Lasix  gtt, developed AKI with creatinine up to 3.15. Cardiac MRI showed mild LV dilation with LV EF 35%, RV EF 34%, no delayed enhancement, and possible severe MR with regurgitant fraction 45%. TEE was done to reassess mitral regurgitation after diuresis, this showed EF 35-40%, only trivial mitral regurgitation and normal IVC.   Admitted 02/09/24 with hyperkalemia of 6.1. Initially had  peaked T waves which resolved after receiving 3 doses of lokelma . Valsartan , spironolactone  were stopped. Imdur  / hydralazine  held due to BP.    She presents today for a post hospital visit with a chief complaint of shortness of breath. Has associated fatigue, occasional palpitations, lightheaded at times, minimal pedal edema. Glucose still running high but is now 140 while in the office. Confused about her medications as there have been so many changes over the last couple of weeks. Confirms that she's not taking valsartan , entresto  or spironolactone . Has continued taking isosorbide . Hasn't taken any furosemide  since discharge and is concerned that she's lost a few pounds even without the diuretic.    ECG : not done  Labs (7/25): HS-TnI 19 => 20, BNP 4236, K 5.2, creatinine 1.82 Labs (8/25): Creatinine 3.1, BNP >4500 Labs (9/25): K 5.3, creatinine 1.86 Labs (9/25): K 6.1 => 5.2, Glucose 598 => 121, creatinine 2.52 => 2.37  PMH: 1. Type 1 diabetes with h/o DKA.  2. Diabetic gastroparesis: Severe  3. Adrenal insufficiency: She is on hydrocortisone  4. CKD stage 3: Diabetic nephropathy.  5. Chronic HF with recovered EF: H/o nonischemic cardiomyopathy.  - Echo (7/22): EF 40-45% => ?peri-partum cardiomyopathy.  - Echo (2/23): EF 25% - LHC (2/23, [facility]): no significant CAD.  - Echo (3/23): EF 20% - Echo (10/23): EF 30-35% ([facility]) - Echo (6/25): EF 55-60%, GLS -23.4%, normal diastolic function, RV normal, mild-moderate MR, IVC normal - Cardiac MRI (8/25): mild LV dilation with LV EF 35%, RV EF 34%, no delayed enhancement,  and possible severe MR with regurgitant fraction 45%. - TEE (8/25): EF 35-40%, only trivial mitral regurgitation and normal IVC.  6. Diabetic neuropathy.  7. PEA arrest after EGD in 3/23.  8. AKI in 3/23 from possible acute interstitial nephritis, required transient HD.  9. Enterococcal bacteremia in 3/23.  10. RUE DVT in 2022.   SH: Lives with roommate in [place], 2 daughters,  nonsmoker, no ETOH.  Not working.   FH: Father with type 1 diabetes, CAD with stent around age 30, died with cardiac arrest around age 38. Sister with SVT.   ROS: All systems reviewed and negative except as per HPI.     Past Medical History:  Diagnosis Date   Acute metabolic encephalopathy 06/10/2022   Adrenal insufficiency    Anemia    Anxiety    Blood transfusion without reported diagnosis    CHF (congestive heart failure) (HCC)    Chronic kidney disease    Depression    Gastroparesis    Hypertension    Hypotension    Intractable nausea and vomiting 11/13/2022   Migraine    Thyroid  disease    Type 1 diabetes (HCC)     Current Outpatient Medications  Medication Sig Dispense Refill   Accu-Chek Softclix Lancets lancets Use to check blood sugar up to 6 times daily. May substitute to any manufacturer covered by patient's insurance. 100 each 5   acetaminophen  (TYLENOL ) 500 MG tablet Take 2 tablets (1,000 mg total) by mouth every 6 (six) hours as needed for headache, fever or moderate pain. 30 tablet 3   BD PEN NEEDLE MINI ULTRAFINE 31G X 5 MM MISC by Other route.     Blood Glucose Monitoring Suppl (BLOOD GLUCOSE MONITOR SYSTEM) w/Device KIT Use to check blood sugar up to 6 times daily. May substitute to any manufacturer covered by patient's insurance. 1 kit 0   Continuous Glucose Sensor (DEXCOM G7 SENSOR) MISC Use to check blood sugar. Change every 10 days 3 each 3   divalproex  (DEPAKOTE ) 250 MG DR tablet Take 250 mg by mouth in the morning.     furosemide  (LASIX ) 20 MG tablet Take 1 tablet (20 mg total) by mouth daily as needed. 30 tablet 3   Glucose Blood (BLOOD GLUCOSE TEST STRIPS) STRP Use to check blood sugar up to 6 times daily. May substitute to any manufacturer covered by patient's insurance. 100 strip 5   hydrocortisone  (CORTEF ) 10 MG tablet Take 2 tablets (20 mg total) by mouth daily with breakfast AND 1 tablet (10 mg total) daily with supper. (Patient taking differently:  Take 1.5 tablets (15 mg total) by mouth daily with breakfast AND 0.5 tablet (5 mg total) daily with supper.) 90 tablet 0   insulin  aspart (NOVOLOG ) 100 UNIT/ML FlexPen Inject 2 Units into the skin 3 (three) times daily with meals. If eating and Blood Glucose (BG) 80 or higher inject 2 units for meal coverage and add correction dose per scale. If not eating, correction dose only. BG <150= 0 unit; BG 150-200= 1 unit; BG 201-250= 3 unit; BG 251-300= 5 unit; BG 301-350= 7 unit; BG 351-400= 9 unit; BG >400= 11 unit and Call Primary Care.     insulin  glargine (LANTUS ) 100 UNIT/ML Solostar Pen Inject 10 Units into the skin daily. (Patient not taking: Reported on 03/27/2024)     Insulin  Pen Needle (PEN NEEDLES) 31G X 6 MM MISC PEN NEEDLES 31G X 6 MM     isosorbide  mononitrate (IMDUR ) 30 MG 24 hr tablet  Take 15 mg by mouth daily. (Patient not taking: Reported on 03/27/2024)     Lancet Device MISC Use to check blood sugar up to 6 times daily. May substitute to any manufacturer covered by patient's insurance. 1 each 1   levothyroxine  (SYNTHROID ) 75 MCG tablet TAKE 1 TABLET (75 MCG TOTAL) BY MOUTH DAILY AT 6 (SIX) AM. 90 tablet 0   metoprolol  succinate (TOPROL -XL) 25 MG 24 hr tablet TAKE 1 TABLET (25 MG TOTAL) BY MOUTH DAILY. 90 tablet 1   Pancrelipase , Lip-Prot-Amyl, 24000-76000 units CPEP Take 1 capsule (24,000 Units total) by mouth 3 (three) times daily before meals. 270 capsule 0   rosuvastatin  (CRESTOR ) 10 MG tablet Take 1 tablet (10 mg total) by mouth daily. 30 tablet 11   sacubitril -valsartan  (ENTRESTO ) 24-26 MG Take 1 tablet by mouth 2 (two) times daily. 60 tablet 2   sodium zirconium cyclosilicate  (LOKELMA ) 10 g PACK packet Take 10 g by mouth daily. 30 packet 3   No current facility-administered medications for this visit.    No Known Allergies    Social History   Socioeconomic History   Marital status: Single    Spouse name: Not on file   Number of children: Not on file   Years of education: Not  on file   Highest education level: 9th grade  Occupational History   Occupation: home maker  Tobacco Use   Smoking status: Never    Passive exposure: Never   Smokeless tobacco: Never  Vaping Use   Vaping status: Never Used  Substance and Sexual Activity   Alcohol use: No   Drug use: No   Sexual activity: Not Currently    Birth control/protection: None  Other Topics Concern   Not on file  Social History Narrative   Not on file   Social Drivers of Health   Financial Resource Strain: Medium Risk (01/18/2024)   Overall Financial Resource Strain (CARDIA)    Difficulty of Paying Living Expenses: Somewhat hard  Food Insecurity: No Food Insecurity (03/26/2024)   Hunger Vital Sign    Worried About Running Out of Food in the Last Year: Never true    Ran Out of Food in the Last Year: Never true  Recent Concern: Food Insecurity - Food Insecurity Present (01/18/2024)   Hunger Vital Sign    Worried About Running Out of Food in the Last Year: Sometimes true    Ran Out of Food in the Last Year: Sometimes true  Transportation Needs: No Transportation Needs (03/26/2024)   PRAPARE - Administrator, Civil Service (Medical): No    Lack of Transportation (Non-Medical): No  Physical Activity: Insufficiently Active (01/18/2024)   Exercise Vital Sign    Days of Exercise per Week: 3 days    Minutes of Exercise per Session: 30 min  Stress: Stress Concern Present (01/18/2024)   [employer] of Occupational Health - Occupational Stress Questionnaire    Feeling of Stress: Rather much  Social Connections: Socially Isolated (02/09/2024)   Social Connection and Isolation Panel    Frequency of Communication with Friends and Family: Once a week    Frequency of Social Gatherings with Friends and Family: Once a week    Attends Religious Services: Never    Database Administrator or Organizations: No    Attends Banker Meetings: Never    Marital Status: Never married  Intimate  Partner Violence: Unknown (03/26/2024)   Humiliation, Afraid, Rape, and Kick questionnaire    Fear of Current  or Ex-Partner: No    Emotionally Abused: No    Physically Abused: Not on file    Sexually Abused: No      Family History  Problem Relation Age of Onset   Breast cancer Mother    Seizures Mother    Cancer Mother    Diabetes type I Father    CAD Father    Diabetes Father    Early death Father    Lung cancer Maternal Grandfather    CAD Paternal Grandmother    Diabetes Paternal Grandmother    CAD Paternal Grandfather    Diabetes Paternal Grandfather    ADD / ADHD Daughter    Diabetes Maternal Uncle    Ovarian cancer Neg Hx    There were no vitals filed for this visit.  Wt Readings from Last 3 Encounters:  03/26/24 115 lb (52.2 kg)  03/26/24 115 lb (52.2 kg)  03/07/24 116 lb 9.6 oz (52.9 kg)   Lab Results  Component Value Date   CREATININE 2.13 (H) 03/27/2024   CREATININE 2.01 (H) 03/26/2024   CREATININE 2.07 (H) 03/26/2024     PHYSICAL EXAM:  General: Well appearing.  Cor: No JVD. Regular rhythm, rate.  Lungs: clear Abdomen: soft, nontender, nondistended. Extremities: no edema Neuro:. Affect pleasant   ECG: not done   ASSESSMENT & PLAN:   1. Chronic systolic CHF: Patient has history of nonischemic cardiomyopathy that was primarily followed at [facility] in the past.  There was concern that this could be a peri-partum cardiomyopathy.  Echo in 7/22 after the birth of her second daughter showed EF 40-45%.  This had declined to 25% by 2/23, ?peripartum CMP.  Cath in 2/23 at [facility] showed no significant CAD.  However, echo in 6/25 at [facility] was reviewed and noted to be relatively normal except for mild to moderate MR => EF 55-60%, GLS -23.4%, normal diastolic function, RV normal, mild-moderate MR, IVC normal.  Despite the unremarkable echo in 6/25, she struggled with volume overload and was admitted in 8/25 with CHF.  Cardiac MRI in 8/25 showed moderately depressed LV EF  35% and RV EF 34%, no LGE/scarring noted, mitral regurgitation looked severe.  This was a significant change from 6/25 echo.  I think coronary disease is unlikely with no LGE on cMRI and normal cath in 2023.  Would avoid coronary angiography with elevated creatinine.  Possible diabetic cardiomyopathy. TEE was done after diuresis to assess MR, this showed EF 35-40%, mild RV dysfunction, and only trivial mitral regurgitation. She developed AKI with diuresis during 8/25 admission, but most recent creatinine back down to 1.86. NYHA Camacho II currently, euvolemic  BP stable.  - Continue Lasix  20 mg daily PRN.  - She has continued taking Imdur  15mg  daily so will continue for now as she reports some frustration with all the med changes occurring  - Continue Toprol  XL 25 mg daily.  - Continue holding spironolactone , ARB. Would like to stop Imdur  at next visit and possibly try valsartan  again.  - She is not a candidate for SGLT2 inhibitor with type 1 diabetes.  2. Hyperlipidemia: continue crestor  10 mg daily.  3. CKD stage 3: Diabetic nephropathy.   - BMET today 4. Type 1 diabetes: Poor control.  Needs close followup.  5. HTN: BP stable   Followup with HF pharmacist in 1 wk for med titration.   I spent 30 minutes reviewing records, interviewing/examining patient, and managing orders.   Ashley DELENA Class, FNP 04/08/24

## 2024-04-09 ENCOUNTER — Encounter: Payer: Self-pay | Admitting: Family

## 2024-04-09 ENCOUNTER — Ambulatory Visit: Admitting: Family

## 2024-04-09 ENCOUNTER — Other Ambulatory Visit
Admission: RE | Admit: 2024-04-09 | Discharge: 2024-04-09 | Disposition: A | Discharge status: Home / Self Care | Source: Ambulatory Visit | Attending: Family | Admitting: Family

## 2024-04-09 VITALS — BP 112/72 | HR 89 | Wt 120.0 lb

## 2024-04-09 DIAGNOSIS — E108 Type 1 diabetes mellitus with unspecified complications: Secondary | ICD-10-CM

## 2024-04-09 DIAGNOSIS — N184 Chronic kidney disease, stage 4 (severe): Secondary | ICD-10-CM

## 2024-04-09 DIAGNOSIS — I5042 Chronic combined systolic (congestive) and diastolic (congestive) heart failure: Secondary | ICD-10-CM | POA: Insufficient documentation

## 2024-04-09 DIAGNOSIS — E782 Mixed hyperlipidemia: Secondary | ICD-10-CM | POA: Diagnosis not present

## 2024-04-09 DIAGNOSIS — I10 Essential (primary) hypertension: Secondary | ICD-10-CM

## 2024-04-09 LAB — BASIC METABOLIC PANEL WITH GFR
Anion gap: 10 (ref 5–15)
BUN: 37 mg/dL — ABNORMAL HIGH (ref 6–20)
CO2: 23 mmol/L (ref 22–32)
Calcium: 8.5 mg/dL — ABNORMAL LOW (ref 8.9–10.3)
Chloride: 110 mmol/L (ref 98–111)
Creatinine, Ser: 1.94 mg/dL — ABNORMAL HIGH (ref 0.44–1.00)
GFR, Estimated: 34 mL/min — ABNORMAL LOW (ref >60)
Glucose, Bld: 147 mg/dL — ABNORMAL HIGH (ref 70–99)
Potassium: 4.7 mmol/L (ref 3.5–5.1)
Sodium: 143 mmol/L (ref 135–145)

## 2024-04-09 NOTE — Patient Instructions (Addendum)
 It was good to see you today!  If you receive a satisfaction survey regarding the [facility], please take the time to fill it out. This way we can continue to provide excellent care and make any changes that need to be made.   Medication Changes:  No medication changes today!  Lab Work:  Go over to the [facility]. Go pass the gift shop and have your blood work completed.   We will only call you if the results are abnormal or if the provider would like to make medication changes.  No news is good news.    Follow-Up in: Please follow up with the [facility] in 2 months with Ellouise Class, FNP.   Thank you for choosing [facility].    At the [facility], you and your health needs are our priority. We have a designated team specialized in the treatment of Heart Failure. This Care Team includes your primary Heart Failure Specialized Cardiologist (physician), Advanced Practice Providers (APPs- Physician Assistants and Nurse Practitioners), and Pharmacist who all work together to provide you with the care you need, when you need it.   You may see any of the following providers on your designated Care Team at your next follow up:  Dr. Toribio Fuel Dr. Ezra Shuck Dr. Ria Commander Dr. Morene Brownie Ellouise Class, FNP Jaun Bash, RPH-CPP  Please be sure to bring in all your medications bottles to every appointment.   Need to Contact Us :  If you have any questions or concerns before your next appointment please send us  a message through [place] or call our office at [phone].    TO LEAVE A MESSAGE FOR THE NURSE SELECT OPTION 2, PLEASE LEAVE A MESSAGE INCLUDING: YOUR NAME DATE OF BIRTH CALL BACK NUMBER REASON FOR CALL**this is important as we prioritize the call backs  YOU WILL RECEIVE A CALL BACK THE SAME DAY AS LONG AS YOU CALL BEFORE 4:00 PM

## 2024-04-10 ENCOUNTER — Ambulatory Visit: Payer: Self-pay | Admitting: Family

## 2024-04-11 ENCOUNTER — Ambulatory Visit: Admitting: Physician Assistant

## 2024-04-11 VITALS — BP 175/80 | HR 78 | Resp 16 | Ht 61.0 in | Wt 118.6 lb

## 2024-04-11 DIAGNOSIS — E039 Hypothyroidism, unspecified: Secondary | ICD-10-CM

## 2024-04-11 DIAGNOSIS — I5020 Unspecified systolic (congestive) heart failure: Secondary | ICD-10-CM

## 2024-04-11 DIAGNOSIS — E109 Type 1 diabetes mellitus without complications: Secondary | ICD-10-CM

## 2024-04-11 DIAGNOSIS — E876 Hypokalemia: Secondary | ICD-10-CM

## 2024-04-11 DIAGNOSIS — I10 Essential (primary) hypertension: Secondary | ICD-10-CM | POA: Diagnosis not present

## 2024-04-11 DIAGNOSIS — Z01419 Encounter for gynecological examination (general) (routine) without abnormal findings: Secondary | ICD-10-CM

## 2024-04-11 NOTE — Progress Notes (Signed)
 Established patient visit  Patient: Ashley Camacho   DOB: [birth year]   32 y.o. Female  MRN: [id number] Visit Date: 04/11/2024  Today's healthcare provider: Jolynn Spencer, PA-C   Chief Complaint  Patient presents with      Medical Management of Chronic Issues   Subjective       Discussed the use of AI scribe software for clinical note transcription with the patient, who gave verbal consent to proceed.  History of Present Illness Ashley Camacho is a 33 year old female who presents for a chronic medical conditions  Her last Pap smear was three years ago during her last pregnancy, with normal results.  She has diabetes with a recent A1c of 8.9 and uses an Omnipod insulin  pump, leading to frequent hypoglycemic episodes. She is in contact with her endocrinologist for pump adjustments. Her diabetes medication includes the Omnipod insulin  pump. She has hypertension, managed with metoprolol  25 mg and Entresto  24/26 mg twice daily, though she did not take her medication on the morning of the visit.  She also takes Crestor  10 mg for cholesterol management. She experiences occasional bruising, attributed to her three-year-old child. She received a flu shot this season and is open to other vaccinations, including the COVID vaccine, preferring to space them out. No issues with bowel movements, neck swelling, or vision.       01/18/2024    1:10 PM 12/21/2023    1:42 PM 10/21/2023    2:58 PM  Depression screen PHQ 2/9  Decreased Interest 1 1 0  Down, Depressed, Hopeless 2 2 2   PHQ - 2 Score 3 3 2   Altered sleeping 2 1 2   Tired, decreased energy 2 2 2   Change in appetite 1 2 0  Feeling bad or failure about yourself  2 2 2   Trouble concentrating 2 2 0  Moving slowly or fidgety/restless 2 2 2   Suicidal thoughts 0 1 0  PHQ-9 Score 14  15  10    Difficult doing work/chores Somewhat difficult Somewhat difficult Somewhat difficult     Data saved with a previous flowsheet row definition       01/18/2024    1:10 PM 12/21/2023    1:43 PM 10/21/2023    2:58 PM 05/10/2023   10:27 AM  GAD 7 : Generalized Anxiety Score  Nervous, Anxious, on Edge 3 2 2 2   Control/stop worrying 2 2 2 2   Worry too much - different things 2 2 3 2   Trouble relaxing 3 1 1 1   Restless 3 2 2 1   Easily annoyed or irritable 3 2 1 3   Afraid - awful might happen 0 0 0 0  Total GAD 7 Score 16 11 11 11   Anxiety Difficulty Somewhat difficult Somewhat difficult Somewhat difficult Very difficult    Medications: Outpatient Medications Prior to Visit  Medication Sig   Accu-Chek Softclix Lancets lancets Use to check blood sugar up to 6 times daily. May substitute to any manufacturer covered by patient's insurance.   acetaminophen  (TYLENOL ) 500 MG tablet Take 2 tablets (1,000 mg total) by mouth every 6 (six) hours as needed for headache, fever or moderate pain.   BD PEN NEEDLE MINI ULTRAFINE 31G X 5 MM MISC by Other route.   Blood Glucose Monitoring Suppl (BLOOD GLUCOSE MONITOR SYSTEM) w/Device KIT Use to check blood sugar up to 6 times daily. May substitute to any manufacturer covered by patient's insurance.   Continuous Glucose Sensor (DEXCOM G7 SENSOR) MISC Use to check blood  sugar. Change every 10 days   furosemide  (LASIX ) 20 MG tablet Take 1 tablet (20 mg total) by mouth daily as needed.   Glucose Blood (BLOOD GLUCOSE TEST STRIPS) STRP Use to check blood sugar up to 6 times daily. May substitute to any manufacturer covered by patient's insurance.   hydrocortisone  (CORTEF ) 10 MG tablet Take 2 tablets (20 mg total) by mouth daily with breakfast AND 1 tablet (10 mg total) daily with supper.   insulin  aspart (NOVOLOG ) 100 UNIT/ML FlexPen Inject 2 Units into the skin 3 (three) times daily with meals. If eating and Blood Glucose (BG) 80 or higher inject 2 units for meal coverage and add correction dose per scale. If not eating, correction dose only. BG <150= 0 unit; BG 150-200= 1 unit; BG 201-250= 3 unit; BG 251-300= 5  unit; BG 301-350= 7 unit; BG 351-400= 9 unit; BG >400= 11 unit and Call Primary Care.   Insulin  Pen Needle (PEN NEEDLES) 31G X 6 MM MISC PEN NEEDLES 31G X 6 MM   Lancet Device MISC Use to check blood sugar up to 6 times daily. May substitute to any manufacturer covered by patient's insurance.   levothyroxine  (SYNTHROID ) 75 MCG tablet TAKE 1 TABLET (75 MCG TOTAL) BY MOUTH DAILY AT 6 (SIX) AM.   metoprolol  succinate (TOPROL -XL) 25 MG 24 hr tablet TAKE 1 TABLET (25 MG TOTAL) BY MOUTH DAILY.   nortriptyline (PAMELOR) 25 MG capsule Take 25 mg by mouth at bedtime. (Patient taking differently: Take 15 mg by mouth at bedtime. Pt taking 15mg  for 30 days after she will resume 25mg )   Pancrelipase , Lip-Prot-Amyl, 24000-76000 units CPEP Take 1 capsule (24,000 Units total) by mouth 3 (three) times daily before meals.   rosuvastatin  (CRESTOR ) 10 MG tablet Take 1 tablet (10 mg total) by mouth daily.   sacubitril -valsartan  (ENTRESTO ) 24-26 MG Take 1 tablet by mouth 2 (two) times daily.   sodium zirconium cyclosilicate  (LOKELMA ) 10 g PACK packet Take 10 g by mouth daily.   No facility-administered medications prior to visit.    Review of Systems All negative Except see HPI       Objective    BP (!) 175/80   Pulse 78   Resp 16   Ht 5' 1 (1.549 m)   Wt 118 lb 9.6 oz (53.8 kg)   LMP 03/07/2024   SpO2 100%   BMI 22.41 kg/m     Physical Exam Vitals reviewed.  Constitutional:      General: She is not in acute distress.    Appearance: Normal appearance. She is well-developed. She is not diaphoretic.  HENT:     Head: Normocephalic and atraumatic.  Eyes:     General: No scleral icterus.    Conjunctiva/sclera: Conjunctivae normal.  Neck:     Thyroid : No thyromegaly.  Cardiovascular:     Rate and Rhythm: Normal rate and regular rhythm.     Pulses: Normal pulses.     Heart sounds: Normal heart sounds. No murmur heard. Pulmonary:     Effort: Pulmonary effort is normal. No respiratory distress.      Breath sounds: Normal breath sounds. No wheezing, rhonchi or rales.  Musculoskeletal:     Cervical back: Neck supple.     Right lower leg: No edema.     Left lower leg: No edema.  Lymphadenopathy:     Cervical: No cervical adenopathy.  Skin:    General: Skin is warm and dry.     Findings: No rash.  Neurological:  Mental Status: She is alert and oriented to person, place, and time. Mental status is at baseline.  Psychiatric:        Mood and Affect: Mood normal.        Behavior: Behavior normal.      No results found for any visits on 04/11/24.      Assessment & Plan Type 1 diabetes mellitus Chronic and unstable A1c at 8.9% Goal <7 indicates poor control. Hypoglycemic episodes likely due to Omnipod settings. Endocrinology involved.  No dm retinopathy - Contact endocrinology regarding hypoglycemic episodes and Omnipod settings. - Ensure communication with endocrinology until glucose levels stabilize. Pt promised to contact endocrinology regarding her BG readings in 40-50's Will follow-up  Hypertension Chronic and unstable Blood pressure at 175/50 mmHg. Non-adherence to medication noted. Home readings 120 mmHg, per pt - Ensure adherence to antihypertensive medication regimen: furosemide  20mg . Metoprolol  25mg , entresto  24-26 Continue rosuvastatin  10mg  for hyperlipidemia - Monitor blood pressure regularly and bring readings to next appointment. - Scheduled follow-up in two months, with earlier visit if blood pressure remains elevated. Continue low sodium diet and regular exercise Will recheck with pt during the weekend.  Heart failure Chronic and stable Managed with Lasix  20mg  prn, metoprolol , and Entresto  bid. Potassium elevated, managed with Lokelma . - Continue current heart failure medications: Lasix , metoprolol , and Entresto . - Continue Lokelma  for potassium management. Will follow-up  Hypokalemia, on potassium binder Hypokalemia managed with Lokelma . -  Continue Lokelma  for potassium management.  Hypothyroidism Chronic Stable Tsh was 1/778 on 03/26/24 Continue levothyroxine  75mcg Will follow-up  General Health Maintenance Discussed vaccinations including flu, COVID, and HPV. Prefers one at a time. - Consider flu vaccine if not already received this season. - Discuss HPV vaccine with gynecology.  Encounter for well woman exam with routine gynecological exam (Primary) Including pap smear - Ambulatory referral to Obstetrics / Gynecology  No orders of the defined types were placed in this encounter.   No follow-ups on file.   The patient was advised to call back or seek an in-person evaluation if the symptoms worsen or if the condition fails to improve as anticipated.  I discussed the assessment and treatment plan with the patient. The patient was provided an opportunity to ask questions and all were answered. The patient agreed with the plan and demonstrated an understanding of the instructions.  I, Shunte Senseney, PA-C have reviewed all documentation for this visit. The documentation on 04/11/2024  for the exam, diagnosis, procedures, and orders are all accurate and complete.  Jolynn Spencer, [facility], MMS [facility] [phone] (phone) [phone] (fax)  [facility]

## 2024-04-24 DIAGNOSIS — I5022 Chronic systolic (congestive) heart failure: Secondary | ICD-10-CM | POA: Diagnosis not present

## 2024-04-24 DIAGNOSIS — E1022 Type 1 diabetes mellitus with diabetic chronic kidney disease: Secondary | ICD-10-CM | POA: Diagnosis not present

## 2024-04-24 DIAGNOSIS — N2581 Secondary hyperparathyroidism of renal origin: Secondary | ICD-10-CM | POA: Diagnosis not present

## 2024-04-24 DIAGNOSIS — D631 Anemia in chronic kidney disease: Secondary | ICD-10-CM | POA: Diagnosis not present

## 2024-04-24 DIAGNOSIS — N1832 Chronic kidney disease, stage 3b: Secondary | ICD-10-CM | POA: Diagnosis not present

## 2024-04-30 ENCOUNTER — Inpatient Hospital Stay

## 2024-04-30 ENCOUNTER — Telehealth: Payer: Self-pay | Admitting: Oncology

## 2024-04-30 ENCOUNTER — Inpatient Hospital Stay: Admitting: Oncology

## 2024-04-30 NOTE — Telephone Encounter (Signed)
 Patient left message on answering service requesting a call back to r/s her appointment for Labs/MD this afternoon due to illness.

## 2024-05-04 DIAGNOSIS — Z419 Encounter for procedure for purposes other than remedying health state, unspecified: Secondary | ICD-10-CM | POA: Diagnosis not present

## 2024-05-27 ENCOUNTER — Emergency Department

## 2024-05-27 ENCOUNTER — Inpatient Hospital Stay
Admission: EM | Admit: 2024-05-27 | Discharge: 2024-05-30 | DRG: 194 | Disposition: A | Discharge status: Home / Self Care | Attending: Internal Medicine | Admitting: Internal Medicine

## 2024-05-27 ENCOUNTER — Other Ambulatory Visit: Payer: Self-pay

## 2024-05-27 DIAGNOSIS — D6959 Other secondary thrombocytopenia: Secondary | ICD-10-CM | POA: Diagnosis present

## 2024-05-27 DIAGNOSIS — E875 Hyperkalemia: Secondary | ICD-10-CM | POA: Diagnosis not present

## 2024-05-27 DIAGNOSIS — F32A Depression, unspecified: Secondary | ICD-10-CM | POA: Diagnosis present

## 2024-05-27 DIAGNOSIS — R079 Chest pain, unspecified: Secondary | ICD-10-CM

## 2024-05-27 DIAGNOSIS — Z8249 Family history of ischemic heart disease and other diseases of the circulatory system: Secondary | ICD-10-CM

## 2024-05-27 DIAGNOSIS — Z59868 Other specified financial insecurity: Secondary | ICD-10-CM

## 2024-05-27 DIAGNOSIS — Z8674 Personal history of sudden cardiac arrest: Secondary | ICD-10-CM

## 2024-05-27 DIAGNOSIS — Z1152 Encounter for screening for COVID-19: Secondary | ICD-10-CM

## 2024-05-27 DIAGNOSIS — G43909 Migraine, unspecified, not intractable, without status migrainosus: Secondary | ICD-10-CM | POA: Diagnosis present

## 2024-05-27 DIAGNOSIS — N183 Chronic kidney disease, stage 3 unspecified: Secondary | ICD-10-CM | POA: Diagnosis not present

## 2024-05-27 DIAGNOSIS — Z801 Family history of malignant neoplasm of trachea, bronchus and lung: Secondary | ICD-10-CM

## 2024-05-27 DIAGNOSIS — Z86718 Personal history of other venous thrombosis and embolism: Secondary | ICD-10-CM

## 2024-05-27 DIAGNOSIS — E1043 Type 1 diabetes mellitus with diabetic autonomic (poly)neuropathy: Secondary | ICD-10-CM | POA: Diagnosis present

## 2024-05-27 DIAGNOSIS — K3184 Gastroparesis: Secondary | ICD-10-CM | POA: Diagnosis present

## 2024-05-27 DIAGNOSIS — N184 Chronic kidney disease, stage 4 (severe): Secondary | ICD-10-CM | POA: Diagnosis present

## 2024-05-27 DIAGNOSIS — Z794 Long term (current) use of insulin: Secondary | ICD-10-CM

## 2024-05-27 DIAGNOSIS — E039 Hypothyroidism, unspecified: Secondary | ICD-10-CM

## 2024-05-27 DIAGNOSIS — I2510 Atherosclerotic heart disease of native coronary artery without angina pectoris: Secondary | ICD-10-CM | POA: Diagnosis present

## 2024-05-27 DIAGNOSIS — E038 Other specified hypothyroidism: Secondary | ICD-10-CM

## 2024-05-27 DIAGNOSIS — K8689 Other specified diseases of pancreas: Secondary | ICD-10-CM | POA: Diagnosis present

## 2024-05-27 DIAGNOSIS — I428 Other cardiomyopathies: Secondary | ICD-10-CM | POA: Diagnosis present

## 2024-05-27 DIAGNOSIS — E1022 Type 1 diabetes mellitus with diabetic chronic kidney disease: Secondary | ICD-10-CM | POA: Diagnosis not present

## 2024-05-27 DIAGNOSIS — R7989 Other specified abnormal findings of blood chemistry: Secondary | ICD-10-CM

## 2024-05-27 DIAGNOSIS — Z833 Family history of diabetes mellitus: Secondary | ICD-10-CM

## 2024-05-27 DIAGNOSIS — D61818 Other pancytopenia: Secondary | ICD-10-CM | POA: Diagnosis present

## 2024-05-27 DIAGNOSIS — N179 Acute kidney failure, unspecified: Secondary | ICD-10-CM | POA: Diagnosis present

## 2024-05-27 DIAGNOSIS — R0789 Other chest pain: Secondary | ICD-10-CM

## 2024-05-27 DIAGNOSIS — J101 Influenza due to other identified influenza virus with other respiratory manifestations: Principal | ICD-10-CM | POA: Diagnosis present

## 2024-05-27 DIAGNOSIS — N189 Chronic kidney disease, unspecified: Secondary | ICD-10-CM | POA: Diagnosis not present

## 2024-05-27 DIAGNOSIS — E785 Hyperlipidemia, unspecified: Secondary | ICD-10-CM | POA: Diagnosis present

## 2024-05-27 DIAGNOSIS — Z555 Less than a high school diploma: Secondary | ICD-10-CM

## 2024-05-27 DIAGNOSIS — E1065 Type 1 diabetes mellitus with hyperglycemia: Secondary | ICD-10-CM | POA: Diagnosis present

## 2024-05-27 DIAGNOSIS — I130 Hypertensive heart and chronic kidney disease with heart failure and stage 1 through stage 4 chronic kidney disease, or unspecified chronic kidney disease: Secondary | ICD-10-CM | POA: Diagnosis present

## 2024-05-27 DIAGNOSIS — Z803 Family history of malignant neoplasm of breast: Secondary | ICD-10-CM

## 2024-05-27 DIAGNOSIS — Z79890 Hormone replacement therapy (postmenopausal): Secondary | ICD-10-CM

## 2024-05-27 DIAGNOSIS — I5032 Chronic diastolic (congestive) heart failure: Secondary | ICD-10-CM

## 2024-05-27 DIAGNOSIS — F419 Anxiety disorder, unspecified: Secondary | ICD-10-CM | POA: Diagnosis present

## 2024-05-27 DIAGNOSIS — Z79899 Other long term (current) drug therapy: Secondary | ICD-10-CM

## 2024-05-27 DIAGNOSIS — I5022 Chronic systolic (congestive) heart failure: Secondary | ICD-10-CM | POA: Diagnosis present

## 2024-05-27 DIAGNOSIS — R000 Tachycardia, unspecified: Secondary | ICD-10-CM | POA: Diagnosis present

## 2024-05-27 DIAGNOSIS — E860 Dehydration: Secondary | ICD-10-CM | POA: Diagnosis present

## 2024-05-27 DIAGNOSIS — E2740 Unspecified adrenocortical insufficiency: Secondary | ICD-10-CM | POA: Diagnosis present

## 2024-05-27 LAB — BASIC METABOLIC PANEL WITH GFR
Anion gap: 13 (ref 5–15)
BUN: 37 mg/dL — ABNORMAL HIGH (ref 6–20)
CO2: 24 mmol/L (ref 22–32)
Calcium: 9.1 mg/dL (ref 8.9–10.3)
Chloride: 101 mmol/L (ref 98–111)
Creatinine, Ser: 2.3 mg/dL — ABNORMAL HIGH (ref 0.44–1.00)
GFR, Estimated: 28 mL/min — ABNORMAL LOW
Glucose, Bld: 142 mg/dL — ABNORMAL HIGH (ref 70–99)
Potassium: 6.3 mmol/L (ref 3.5–5.1)
Sodium: 138 mmol/L (ref 135–145)

## 2024-05-27 LAB — CBC
HCT: 31.4 % — ABNORMAL LOW (ref 36.0–46.0)
Hemoglobin: 10.4 g/dL — ABNORMAL LOW (ref 12.0–15.0)
MCH: 30.8 pg (ref 26.0–34.0)
MCHC: 33.1 g/dL (ref 30.0–36.0)
MCV: 92.9 fL (ref 80.0–100.0)
Platelets: 157 K/uL (ref 150–400)
RBC: 3.38 MIL/uL — ABNORMAL LOW (ref 3.87–5.11)
RDW: 11.4 % — ABNORMAL LOW (ref 11.5–15.5)
WBC: 3.3 K/uL — ABNORMAL LOW (ref 4.0–10.5)
nRBC: 0 % (ref 0.0–0.2)

## 2024-05-27 LAB — CBG MONITORING, ED
Glucose-Capillary: 136 mg/dL — ABNORMAL HIGH (ref 70–99)
Glucose-Capillary: 78 mg/dL (ref 70–99)

## 2024-05-27 LAB — RESP PANEL BY RT-PCR (RSV, FLU A&B, COVID)  RVPGX2
Influenza A by PCR: POSITIVE — AB
Influenza B by PCR: NEGATIVE
Resp Syncytial Virus by PCR: NEGATIVE
SARS Coronavirus 2 by RT PCR: NEGATIVE

## 2024-05-27 LAB — TROPONIN T, HIGH SENSITIVITY
Troponin T High Sensitivity: 25 ng/L — ABNORMAL HIGH (ref 0–19)
Troponin T High Sensitivity: 23 ng/L — ABNORMAL HIGH (ref 0–19)

## 2024-05-27 MED ORDER — PANCRELIPASE (LIP-PROT-AMYL) 12000-38000 UNITS PO CPEP
24000.0000 [IU] | ORAL_CAPSULE | Freq: Three times a day (TID) | ORAL | Status: DC
  Administered 2024-05-28 – 2024-05-30 (×4): 24000 [IU] via ORAL
  Filled 2024-05-27 (×9): qty 2

## 2024-05-27 MED ORDER — INSULIN ASPART 100 UNIT/ML IJ SOLN
0.0000 [IU] | Freq: Three times a day (TID) | INTRAMUSCULAR | Status: DC
  Administered 2024-05-28: 5 [IU] via SUBCUTANEOUS
  Filled 2024-05-27: qty 5

## 2024-05-27 MED ORDER — INSULIN ASPART 100 UNIT/ML IJ SOLN: 0.0000 [IU] | Freq: Every day | INTRAMUSCULAR | Status: DC

## 2024-05-27 MED ORDER — SODIUM BICARBONATE 8.4 % IV SOLN
50.0000 meq | Freq: Once | INTRAVENOUS | Status: AC
  Administered 2024-05-27: 50 meq via INTRAVENOUS
  Filled 2024-05-27: qty 50

## 2024-05-27 MED ORDER — ACETAMINOPHEN 325 MG PO TABS
650.0000 mg | ORAL_TABLET | Freq: Four times a day (QID) | ORAL | Status: DC | PRN
  Administered 2024-05-28: 650 mg via ORAL
  Filled 2024-05-27: qty 2

## 2024-05-27 MED ORDER — DEXTROSE 50 % IV SOLN
1.0000 | Freq: Once | INTRAVENOUS | Status: AC
  Administered 2024-05-27: 50 mL via INTRAVENOUS
  Filled 2024-05-27: qty 50

## 2024-05-27 MED ORDER — SODIUM ZIRCONIUM CYCLOSILICATE 10 G PO PACK
10.0000 g | PACK | Freq: Every day | ORAL | Status: DC
  Filled 2024-05-27: qty 1

## 2024-05-27 MED ORDER — INSULIN ASPART 100 UNIT/ML IV SOLN
5.0000 [IU] | Freq: Once | INTRAVENOUS | Status: AC
  Administered 2024-05-27: 5 [IU] via INTRAVENOUS
  Filled 2024-05-27: qty 5

## 2024-05-27 MED ORDER — SODIUM ZIRCONIUM CYCLOSILICATE 10 G PO PACK
10.0000 g | PACK | Freq: Once | ORAL | Status: AC
  Administered 2024-05-27: 10 g via ORAL
  Filled 2024-05-27: qty 1

## 2024-05-27 MED ORDER — ONDANSETRON HCL 4 MG PO TABS: 4.0000 mg | ORAL_TABLET | Freq: Four times a day (QID) | ORAL | Status: DC | PRN

## 2024-05-27 MED ORDER — LEVOTHYROXINE SODIUM 50 MCG PO TABS
75.0000 ug | ORAL_TABLET | Freq: Every day | ORAL | Status: DC
  Administered 2024-05-28 – 2024-05-30 (×3): 75 ug via ORAL
  Filled 2024-05-27: qty 2
  Filled 2024-05-27 (×2): qty 1

## 2024-05-27 MED ORDER — ACETAMINOPHEN 650 MG RE SUPP: 650.0000 mg | Freq: Four times a day (QID) | RECTAL | Status: DC | PRN

## 2024-05-27 MED ORDER — TRAZODONE HCL 50 MG PO TABS: 25.0000 mg | ORAL_TABLET | Freq: Every evening | ORAL | Status: DC | PRN

## 2024-05-27 MED ORDER — MAGNESIUM HYDROXIDE 400 MG/5ML PO SUSP: 30.0000 mL | Freq: Every day | ORAL | Status: DC | PRN

## 2024-05-27 MED ORDER — METOPROLOL SUCCINATE ER 25 MG PO TB24: 25.0000 mg | ORAL_TABLET | Freq: Every day | ORAL | Status: DC

## 2024-05-27 MED ORDER — CALCIUM GLUCONATE 10 % IV SOLN
1.0000 g | Freq: Once | INTRAVENOUS | Status: AC
  Administered 2024-05-27: 1 g via INTRAVENOUS
  Filled 2024-05-27: qty 10

## 2024-05-27 MED ORDER — ONDANSETRON HCL 4 MG/2ML IJ SOLN
4.0000 mg | Freq: Four times a day (QID) | INTRAMUSCULAR | Status: DC | PRN
  Administered 2024-05-28: 4 mg via INTRAVENOUS
  Filled 2024-05-27: qty 2

## 2024-05-27 MED ORDER — ENOXAPARIN SODIUM 30 MG/0.3ML IJ SOSY
30.0000 mg | PREFILLED_SYRINGE | INTRAMUSCULAR | Status: DC
  Administered 2024-05-27 – 2024-05-29 (×3): 30 mg via SUBCUTANEOUS
  Filled 2024-05-27 (×3): qty 0.3

## 2024-05-27 MED ORDER — ROSUVASTATIN CALCIUM 10 MG PO TABS
10.0000 mg | ORAL_TABLET | Freq: Every day | ORAL | Status: DC
  Administered 2024-05-28 – 2024-05-30 (×3): 10 mg via ORAL
  Filled 2024-05-27 (×3): qty 1

## 2024-05-27 MED ORDER — SODIUM CHLORIDE 0.9 % IV SOLN: INTRAVENOUS | Status: AC

## 2024-05-27 NOTE — Assessment & Plan Note (Signed)
-   The patient will be admitted to an observation cardiac telemetry bed. - Will follow serial troponins and EKGs. - The patient will be placed on aspirin  as well as p.r.n. sublingual nitroglycerin  and morphine  sulfate for pain. - We will obtain a cardiology consult in a.m. for further cardiac risk stratification. - I notified [facility] about the patient

## 2024-05-27 NOTE — ED Provider Notes (Signed)
 "  [facility] Provider Note    Event Date/Time   First MD Initiated Contact with Patient 05/27/24 2042     (approximate)   History   Chest Pain   HPI  Ashley Camacho is a 34 y.o. female who presents to the ED for evaluation of Chest Pain   I reviewed a medical DC summary from November.  History of type 1 diabetes, general insufficiency, CHF, CKD, gastroparesis  Patient presents with cough, congestion and intermittent chest discomfort over the past 1 week   Physical Exam   Triage Vital Signs: ED Triage Vitals  Encounter Vitals Group     BP 05/27/24 1948 102/73     Girls Systolic BP Percentile --      Girls Diastolic BP Percentile --      Boys Systolic BP Percentile --      Boys Diastolic BP Percentile --      Pulse Rate 05/27/24 1948 86     Resp 05/27/24 1948 18     Temp 05/27/24 1948 98.4 F (36.9 C)     Temp Source 05/27/24 1948 Oral     SpO2 05/27/24 1948 100 %     Weight 05/27/24 1946 118 lb (53.5 kg)     Height 05/27/24 1946 5' 1 (1.549 m)     Head Circumference --      Peak Flow --      Pain Score 05/27/24 1946 7     Pain Loc --      Pain Education --      Exclude from Growth Chart --     Most recent vital signs: Vitals:   05/27/24 1948  BP: 102/73  Pulse: 86  Resp: 18  Temp: 98.4 F (36.9 C)  SpO2: 100%    General: Awake, no distress.  CV:  Good peripheral perfusion.  Resp:  Normal effort.  Abd:  No distention.  MSK:  No deformity noted.  Neuro:  No focal deficits appreciated. Other:     ED Results / Procedures / Treatments   Labs (all labs ordered are listed, but only abnormal results are displayed) Labs Reviewed  BASIC METABOLIC PANEL WITH GFR - Abnormal; Notable for the following components:      Result Value   Potassium 6.3 (*)    Glucose, Bld 142 (*)    BUN 37 (*)    Creatinine, Ser 2.30 (*)    GFR, Estimated 28 (*)    All other components within normal limits  CBC - Abnormal; Notable for the  following components:   WBC 3.3 (*)    RBC 3.38 (*)    Hemoglobin 10.4 (*)    HCT 31.4 (*)    RDW 11.4 (*)    All other components within normal limits  CBG MONITORING, ED - Abnormal; Notable for the following components:   Glucose-Capillary 136 (*)    All other components within normal limits  TROPONIN T, HIGH SENSITIVITY - Abnormal; Notable for the following components:   Troponin T High Sensitivity 25 (*)    All other components within normal limits  RESP PANEL BY RT-PCR (RSV, FLU A&B, COVID)  [id number]  POC URINE PREG, ED  TROPONIN T, HIGH SENSITIVITY    EKG Sinus tachycardia with rate of 118 bpm.  Normal axis and intervals.  T wave inversions and nonspecific changes with mild ST depressions to inferior and lateral leads.  No STEMI.  Somewhat similar morphology as previous EKGs   RADIOLOGY CXR interpreted  by me without evidence of acute cardiopulmonary pathology.  Official radiology report(s): DG Chest 2 View Result Date: 05/27/2024 EXAM: 2 VIEW(S) XRAY OF THE CHEST 05/27/2024 08:13:00 PM COMPARISON: 01/02/2024 CLINICAL HISTORY: CP FINDINGS: LUNGS AND PLEURA: No focal pulmonary opacity. No pleural effusion. No pneumothorax. HEART AND MEDIASTINUM: No acute abnormality of the cardiac and mediastinal silhouettes. BONES AND SOFT TISSUES: No acute osseous abnormality. IMPRESSION: 1. No active cardiopulmonary disease. Electronically signed by: Franky Crease MD 05/27/2024 08:16 PM EST RP Workstation: [id number]    PROCEDURES and INTERVENTIONS:  .Critical Care  Performed by: Claudene Rover, MD Authorized by: Claudene Rover, MD   Critical care provider statement:    Critical care time (minutes):  30   Critical care was necessary to treat or prevent imminent or life-threatening deterioration of the following conditions:  Metabolic crisis and endocrine crisis   Critical care was time spent personally by me on the following activities:  Development of treatment plan with patient or surrogate,  discussions with consultants, evaluation of patient's response to treatment, examination of patient, ordering and review of laboratory studies, ordering and review of radiographic studies, ordering and performing treatments and interventions, pulse oximetry, re-evaluation of patient's condition and review of old charts .1-3 Lead EKG Interpretation  Performed by: Claudene Rover, MD Authorized by: Claudene Rover, MD     Interpretation: normal     ECG rate:  90   ECG rate assessment: normal     Rhythm: sinus rhythm     Ectopy: none     Conduction: normal     Medications  insulin  aspart (novoLOG ) injection 5 Units (5 Units Intravenous Given 05/27/24 2117)    And  dextrose  50 % solution 50 mL (50 mLs Intravenous Given 05/27/24 2117)  sodium zirconium cyclosilicate  (LOKELMA ) packet 10 g (10 g Oral Given 05/27/24 2132)  calcium  gluconate inj 10% (1 g) URGENT USE ONLY! (1 g Intravenous Given 05/27/24 2118)  sodium bicarbonate  injection 50 mEq (50 mEq Intravenous Given 05/27/24 2118)     IMPRESSION / MDM / ASSESSMENT AND PLAN / ED COURSE  I reviewed the triage vital signs and the nursing notes.  Differential diagnosis includes, but is not limited to, ACS, PTX, PNA, muscle strain/spasm, PE, dissection, anxiety, pleural effusion  {Patient presents with symptoms of an acute illness or injury that is potentially life-threatening.  Brittle diabetic presents with URI symptoms over the past week and found to be hypokalemic requiring medical admission.    EKG with chronic T wave changes to inferior and lateral leads, ST depressions of somewhat worse intensity than typical but no STEMI.  She has a history of fairly severe CHF at baseline.  Troponin minimally elevated at 25 and we will trend this.  No chest pain during my assessment.  Metabolic panel with CKD at baseline but hyperkalemia to 6.3.  Mild leukopenia, likely viral syndrome with underlying cough and congestion, COVID/flu swab is pending.  Initiate  hyperkalemia protocols and consult with medicine for admission      FINAL CLINICAL IMPRESSION(S) / ED DIAGNOSES   Final diagnoses:  Hyperkalemia  Other chest pain     Rx / DC Orders   ED Discharge Orders     None        Note:  This document was prepared using Dragon voice recognition software and may include unintentional dictation errors.   Claudene Rover, MD 05/27/24 2141  "

## 2024-05-27 NOTE — Progress Notes (Signed)
 Anticoagulation monitoring(Lovenox ):  34 yo female ordered Lovenox  40 mg Q24h    Filed Weights   05/27/24 1946  Weight: 53.5 kg (118 lb)   BMI 22.3   Lab Results  Component Value Date   CREATININE 2.30 (H) 05/27/2024   CREATININE 1.94 (H) 04/09/2024   CREATININE 2.13 (H) 03/27/2024   Estimated Creatinine Clearance: 26.3 mL/min (A) (by C-G formula based on SCr of 2.3 mg/dL (H)). Hemoglobin & Hematocrit     Component Value Date/Time   HGB 10.4 (L) 05/27/2024 1958   HGB 10.0 (L) 12/21/2023 1438   HCT 31.4 (L) 05/27/2024 1958   HCT 31.6 (L) 12/21/2023 1438     Per Protocol for Patient with estCrcl < 30 ml/min and BMI < 30, will transition to Lovenox  30 mg Q24h.

## 2024-05-27 NOTE — Assessment & Plan Note (Signed)
-   This is associated with stage IIIb chronic kidney disease. - The patient will be placed on supplemental coverage with NovoLog .

## 2024-05-27 NOTE — Assessment & Plan Note (Signed)
-   This was managed and will be followed.

## 2024-05-27 NOTE — H&P (Incomplete)
 "     [place]   PATIENT NAME: Ashley Camacho    MR#:  [id number]  DATE OF BIRTH:  29-Jul-1990  DATE OF ADMISSION:  05/27/2024  PRIMARY CARE PHYSICIAN: Ostwalt, [name], PA-C   Patient is coming from: Home  REQUESTING/REFERRING PHYSICIAN: Claudene Rover, MD  CHIEF COMPLAINT:   Chief Complaint  Patient presents with   Chest Pain    HISTORY OF PRESENT ILLNESS:  Ashley Camacho is a 34 y.o. female with medical history significant for anxiety, depression, hypertension, type 1 diabetes mellitus and hypothyroidism as well as migraine, who presented to the emergency room with acute onset of chest pain.  She described her pain as sharp, 6-7/10 in severity which has been intermittent and with associated nausea and diaphoresis without radiation.  She has been having associated cough and chest congestion with her intermittent chest pain over the last week.  No dyspnea or wheezing.  She admitted to diminished p.o. intake.  No fever or chills.  No nausea or vomiting or diarrhea or abdominal pain.  No dysuria, oliguria or hematuria or flank pain.  No headache or dizziness or blurred vision.  No  ED Course: When the patient came to the ER, Vital signs were within normal.  Labs revealed hyperkalemia of 6.3 with a BUN of 37 and creatinine 2.3 compared to 37 and 1.94 on 04/09/24.  High sensitive troponin T was 25 and later 23.  CBC showed leukopenia of 3.3 and hemoglobin 10.4 hematocrit 31.4 above previous levels. EKG as reviewed by me : EKG showed sinus tachycardia with rate 118 with Q waves anteroseptally and T wave inversion inferiorly and laterally. Imaging: 2 view chest x-ray showed no acute cardiopulmonary disease.  The patient was given 10 g of p.o. Lokelma , IV D50/insulin , sodium bicarb ampule and 1 g of IV gluconate.  She will be admitted to an observation progressive unit bed for further evaluation and management. PAST MEDICAL HISTORY:   Past Medical History:  Diagnosis Date   Acute metabolic  encephalopathy 06/10/2022   Adrenal insufficiency    Anemia    Anxiety    Blood transfusion without reported diagnosis    CHF (congestive heart failure) (HCC)    Chronic kidney disease    Depression    Gastroparesis    Hypertension    Hypotension    Intractable nausea and vomiting 11/13/2022   Migraine    Thyroid  disease    Type 1 diabetes (HCC)     PAST SURGICAL HISTORY:   Past Surgical History:  Procedure Laterality Date   BIOPSY  01/14/2021   Procedure: BIOPSY;  Surgeon: Eda Iha, MD;  Location: [facility] ENDOSCOPY;  Service: Gastroenterology;;   CENTRAL LINE INSERTION N/A 10/10/2023   Procedure: CENTRAL LINE INSERTION;  Surgeon: Marea Selinda RAMAN, MD;  Location: [facility] INVASIVE CV LAB;  Service: Cardiovascular;  Laterality: N/A;   CESAREAN SECTION     x2   CESAREAN SECTION WITH BILATERAL TUBAL LIGATION N/A 02/17/2021   Procedure: CESAREAN SECTION WITH BILATERAL TUBAL LIGATION;  Surgeon: Barbra Lang PARAS, DO;  Location: [facility] LD ORS;  Service: Obstetrics;  Laterality: N/A;   ESOPHAGOGASTRODUODENOSCOPY (EGD) WITH PROPOFOL  N/A 01/14/2021   Procedure: ESOPHAGOGASTRODUODENOSCOPY (EGD) WITH PROPOFOL ;  Surgeon: Eda Iha, MD;  Location: [facility] ENDOSCOPY;  Service: Gastroenterology;  Laterality: N/A;   IR BONE MARROW BIOPSY & ASPIRATION  12/16/2022   IR REPLC DUODEN/JEJUNO TUBE PERCUT W/FLUORO  11/22/2022   JEJUNOSTOMY N/A 06/13/2022   Procedure: DEXTER;  Surgeon: Jordis Laneta FALCON, MD;  Location: [facility] ORS;  Service: General;  Laterality: N/A;   MOUTH SURGERY     TEE WITHOUT CARDIOVERSION N/A 01/06/2024   Procedure: ECHOCARDIOGRAM, TRANSESOPHAGEAL;  Surgeon: Rolan Ezra RAMAN, MD;  Location: [facility] ORS;  Service: Cardiovascular;  Laterality: N/A;   TUBAL LIGATION      SOCIAL HISTORY:   Social History   Tobacco Use   Smoking status: Never    Passive exposure: Never   Smokeless tobacco: Never  Substance Use Topics   Alcohol use: No    FAMILY HISTORY:   Family History   Problem Relation Age of Onset   Breast cancer Mother    Seizures Mother    Cancer Mother    Diabetes type I Father    CAD Father    Diabetes Father    Early death Father    Lung cancer Maternal Grandfather    CAD Paternal Grandmother    Diabetes Paternal Grandmother    CAD Paternal Grandfather    Diabetes Paternal Grandfather    ADD / ADHD Daughter    Diabetes Maternal Uncle    Ovarian cancer Neg Hx     DRUG ALLERGIES:  Allergies[1]  REVIEW OF SYSTEMS:   ROS As per history of present illness. All pertinent systems were reviewed above. Constitutional, HEENT, cardiovascular, respiratory, GI, GU, musculoskeletal, neuro, psychiatric, endocrine, integumentary and hematologic systems were reviewed and are otherwise negative/unremarkable except for positive findings mentioned above in the HPI.   MEDICATIONS AT HOME:   Prior to Admission medications  Medication Sig Start Date End Date Taking? Authorizing Provider  NOVOLOG  100 UNIT/ML injection Inject 0-50 Units into the skin as needed for high blood sugar. FOR USE IN INSULIN  PUMP. MAX 50 UNITS PER DAY. 04/14/24  Yes [provider]  Accu-Chek Softclix Lancets lancets Use to check blood sugar up to 6 times daily. May substitute to any manufacturer covered by patient's insurance. 01/19/24   Ostwalt, [name], PA-C  acetaminophen  (TYLENOL ) 500 MG tablet Take 2 tablets (1,000 mg total) by mouth every 6 (six) hours as needed for headache, fever or moderate pain. 08/05/22   Dorinda Drue DASEN, MD  BD PEN NEEDLE MINI ULTRAFINE 31G X 5 MM MISC by Other route. 07/18/23 07/17/24  [provider]  Blood Glucose Monitoring Suppl (BLOOD GLUCOSE MONITOR SYSTEM) w/Device KIT Use to check blood sugar up to 6 times daily. May substitute to any manufacturer covered by patient's insurance. 01/19/24   Ostwalt, [name], PA-C  Continuous Glucose Sensor (DEXCOM G7 SENSOR) MISC Use to check blood sugar. Change every 10 days 01/19/24   Ostwalt, [name], PA-C   furosemide  (LASIX ) 20 MG tablet Take 1 tablet (20 mg total) by mouth daily as needed. 02/17/24 05/17/24  Donette City A, FNP  Glucose Blood (BLOOD GLUCOSE TEST STRIPS) STRP Use to check blood sugar up to 6 times daily. May substitute to any manufacturer covered by patient's insurance. 01/19/24   Ostwalt, [name], PA-C  hydrocortisone  (CORTEF ) 10 MG tablet Take 2 tablets (20 mg total) by mouth daily with breakfast AND 1 tablet (10 mg total) daily with supper. 05/27/23   [name], MD  insulin  aspart (NOVOLOG ) 100 UNIT/ML FlexPen Inject 2 Units into the skin 3 (three) times daily with meals. If eating and Blood Glucose (BG) 80 or higher inject 2 units for meal coverage and add correction dose per scale. If not eating, correction dose only. BG <150= 0 unit; BG 150-200= 1 unit; BG 201-250= 3 unit; BG 251-300= 5 unit; BG 301-350= 7  unit; BG 351-400= 9 unit; BG >400= 11 unit and Call Primary Care. 10/12/23   [name], MD  Insulin  Pen Needle (PEN NEEDLES) 31G X 6 MM MISC PEN NEEDLES 31G X 6 MM 05/31/17   [provider]  Lancet Device MISC Use to check blood sugar up to 6 times daily. May substitute to any manufacturer covered by patient's insurance. 01/19/24   Ostwalt, [name], PA-C  levothyroxine  (SYNTHROID ) 75 MCG tablet TAKE 1 TABLET (75 MCG TOTAL) BY MOUTH DAILY AT 6 (SIX) AM. 12/06/23   Ostwalt, [name], PA-C  metoprolol  succinate (TOPROL -XL) 25 MG 24 hr tablet TAKE 1 TABLET (25 MG TOTAL) BY MOUTH DAILY. 03/23/24   Rolan Ezra RAMAN, MD  nortriptyline (PAMELOR) 25 MG capsule Take 25 mg by mouth at bedtime. Patient taking differently: Take 15 mg by mouth at bedtime. Pt taking 15mg  for 30 days after she will resume 25mg     [provider]  Pancrelipase , Lip-Prot-Amyl, 24000-76000 units CPEP Take 1 capsule (24,000 Units total) by mouth 3 (three) times daily before meals. 11/04/23   [name], MD  rosuvastatin  (CRESTOR ) 10 MG tablet Take 1 tablet (10 mg total) by mouth daily. 01/18/24    Donette Ellouise LABOR, FNP  sacubitril -valsartan  (ENTRESTO ) 24-26 MG Take 1 tablet by mouth 2 (two) times daily. 02/28/24   Rolan Ezra RAMAN, MD  sodium zirconium cyclosilicate  (LOKELMA ) 10 g PACK packet Take 10 g by mouth daily. 02/28/24   Rolan Ezra RAMAN, MD      VITAL SIGNS:  Blood pressure 129/81, pulse (!) 117, temperature 98.5 F (36.9 C), temperature source Oral, resp. rate 17, height 5' 1 (1.549 m), weight 53.5 kg, SpO2 100%.  PHYSICAL EXAMINATION:  Physical Exam  GENERAL:  34 y.o.-year-old Caucasian female patient lying in the bed with no acute distress.  EYES: Pupils equal, round, reactive to light and accommodation. No scleral icterus. Extraocular muscles intact.  HEENT: Head atraumatic, normocephalic. Oropharynx and nasopharynx clear.  NECK:  Supple, no jugular venous distention. No thyroid  enlargement, no tenderness.  LUNGS: Normal breath sounds bilaterally, no wheezing, rales,rhonchi or crepitation. No use of accessory muscles of respiration.  CARDIOVASCULAR: Regular rate and rhythm, S1, S2 normal. No murmurs, rubs, or gallops.  ABDOMEN: Soft, nondistended, nontender. Bowel sounds present. No organomegaly or mass.  EXTREMITIES: No pedal edema, cyanosis, or clubbing.  NEUROLOGIC: Cranial nerves II through XII are intact. Muscle strength 5/5 in all extremities. Sensation intact. Gait not checked.  PSYCHIATRIC: The patient is alert and oriented x 3.  Normal affect and good eye contact. SKIN: No obvious rash, lesion, or ulcer.   LABORATORY PANEL:   CBC Recent Labs  Lab 05/27/24 1958  WBC 3.3*  HGB 10.4*  HCT 31.4*  PLT 157   ------------------------------------------------------------------------------------------------------------------  Chemistries  Recent Labs  Lab 05/27/24 1958  NA 138  K 6.3*  CL 101  CO2 24  GLUCOSE 142*  BUN 37*  CREATININE 2.30*  CALCIUM  9.1    ------------------------------------------------------------------------------------------------------------------  Cardiac Enzymes No results for input(s): TROPONINI in the last 168 hours. ------------------------------------------------------------------------------------------------------------------  RADIOLOGY:  DG Chest 2 View Result Date: 05/27/2024 EXAM: 2 VIEW(S) XRAY OF THE CHEST 05/27/2024 08:13:00 PM COMPARISON: 01/02/2024 CLINICAL HISTORY: CP FINDINGS: LUNGS AND PLEURA: No focal pulmonary opacity. No pleural effusion. No pneumothorax. HEART AND MEDIASTINUM: No acute abnormality of the cardiac and mediastinal silhouettes. BONES AND SOFT TISSUES: No acute osseous abnormality. IMPRESSION: 1. No active cardiopulmonary disease. Electronically signed by: Franky Crease MD 05/27/2024 08:16 PM EST RP Workstation: [id number]  IMPRESSION AND PLAN:  Assessment and Plan: * Chest pain - The patient will be admitted to an observation cardiac telemetry bed. - Will follow serial troponins and EKGs. - The patient will be placed on aspirin  as well as p.r.n. sublingual nitroglycerin  and morphine  sulfate for pain. - We will obtain a cardiology consult in a.m. for further cardiac risk stratification. - I notified [facility] about the patient   Hyperkalemia - This was managed and will be followed.  Hypothyroidism - Will continue Synthroid . - Will check TSH.  Acute kidney injury superimposed on chronic kidney disease - This is AKI superimposed on stage IIIb chronic kidney disease. - This is like secondary to diminished p.o. intake and volume depletion with dehydration. - She will be hydrated with IV normal saline and will follow BMP. - Will avoid nephrotoxins.  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 - The patient will be placed on supplemental coverage with NovoLog .  Pancreatic insufficiency - Will continue  Creon .  Chronic diastolic CHF (congestive heart failure) (HCC) - Will continue Toprol -XL. - Will hold off Entresto  given her hyperkalemia.  Dyslipidemia - Will continue statin therapy.   DVT prophylaxis: Lovenox . Advanced Care Planning:  Code Status: full code. Family Communication:  The plan of care was discussed in details with the patient (and family). I answered all questions. The patient agreed to proceed with the above mentioned plan. Further management will depend upon hospital course. Disposition Plan: Back to previous home environment Consults called: none. All the records are reviewed and case discussed with ED provider.  Status is: Observation  I certify that at the time of admission, it is my clinical judgment that the patient will require hospital care extending less than 2 midnights.                            Dispo: The patient is from: Home              Anticipated d/c is to: Home              Patient currently is not medically stable to d/c.              Difficult to place patient: No  Madison DELENA Peaches M.D on 05/28/2024 at 12:41 AM  [facility]   From 7 PM-7 AM, contact night-coverage [email or web address]  CC: Primary care physician; Ostwalt, [name], PA-C     [1] No Known Allergies  "

## 2024-05-27 NOTE — Assessment & Plan Note (Signed)
-   Will continue Toprol -XL. - Will hold off Entresto  given her hyperkalemia.

## 2024-05-27 NOTE — Assessment & Plan Note (Addendum)
 Will continue statin therapy

## 2024-05-27 NOTE — Assessment & Plan Note (Signed)
-   Will continue Creon .

## 2024-05-27 NOTE — ED Triage Notes (Signed)
 Pt presents via POV c/o chest pain. Reports left sided chest pain since before 05/17/24. Reports worse today. Reports hx CHF. Also reports lips feel numb. No focal neuro deficits.

## 2024-05-27 NOTE — Assessment & Plan Note (Signed)
-   Will continue Synthroid . - Will check TSH.

## 2024-05-28 ENCOUNTER — Observation Stay
Admit: 2024-05-28 | Discharge: 2024-05-28 | Disposition: A | Discharge status: Home / Self Care | Attending: Physician Assistant | Admitting: Physician Assistant

## 2024-05-28 DIAGNOSIS — N179 Acute kidney failure, unspecified: Secondary | ICD-10-CM | POA: Diagnosis present

## 2024-05-28 DIAGNOSIS — Z79890 Hormone replacement therapy (postmenopausal): Secondary | ICD-10-CM | POA: Diagnosis not present

## 2024-05-28 DIAGNOSIS — I130 Hypertensive heart and chronic kidney disease with heart failure and stage 1 through stage 4 chronic kidney disease, or unspecified chronic kidney disease: Secondary | ICD-10-CM | POA: Diagnosis present

## 2024-05-28 DIAGNOSIS — J101 Influenza due to other identified influenza virus with other respiratory manifestations: Secondary | ICD-10-CM

## 2024-05-28 DIAGNOSIS — Z1152 Encounter for screening for COVID-19: Secondary | ICD-10-CM | POA: Diagnosis not present

## 2024-05-28 DIAGNOSIS — I2510 Atherosclerotic heart disease of native coronary artery without angina pectoris: Secondary | ICD-10-CM | POA: Diagnosis present

## 2024-05-28 DIAGNOSIS — F32A Depression, unspecified: Secondary | ICD-10-CM | POA: Diagnosis present

## 2024-05-28 DIAGNOSIS — I5022 Chronic systolic (congestive) heart failure: Secondary | ICD-10-CM | POA: Diagnosis present

## 2024-05-28 DIAGNOSIS — K8689 Other specified diseases of pancreas: Secondary | ICD-10-CM | POA: Diagnosis present

## 2024-05-28 DIAGNOSIS — E860 Dehydration: Secondary | ICD-10-CM | POA: Diagnosis present

## 2024-05-28 DIAGNOSIS — N189 Chronic kidney disease, unspecified: Secondary | ICD-10-CM | POA: Diagnosis not present

## 2024-05-28 DIAGNOSIS — D61818 Other pancytopenia: Secondary | ICD-10-CM | POA: Diagnosis present

## 2024-05-28 DIAGNOSIS — E785 Hyperlipidemia, unspecified: Secondary | ICD-10-CM | POA: Diagnosis present

## 2024-05-28 DIAGNOSIS — I428 Other cardiomyopathies: Secondary | ICD-10-CM | POA: Diagnosis present

## 2024-05-28 DIAGNOSIS — E2740 Unspecified adrenocortical insufficiency: Secondary | ICD-10-CM | POA: Diagnosis present

## 2024-05-28 DIAGNOSIS — E1065 Type 1 diabetes mellitus with hyperglycemia: Secondary | ICD-10-CM | POA: Diagnosis present

## 2024-05-28 DIAGNOSIS — N1832 Chronic kidney disease, stage 3b: Secondary | ICD-10-CM | POA: Diagnosis not present

## 2024-05-28 DIAGNOSIS — Z794 Long term (current) use of insulin: Secondary | ICD-10-CM | POA: Diagnosis not present

## 2024-05-28 DIAGNOSIS — D6959 Other secondary thrombocytopenia: Secondary | ICD-10-CM | POA: Diagnosis present

## 2024-05-28 DIAGNOSIS — R0789 Other chest pain: Secondary | ICD-10-CM | POA: Diagnosis present

## 2024-05-28 DIAGNOSIS — N184 Chronic kidney disease, stage 4 (severe): Secondary | ICD-10-CM | POA: Diagnosis present

## 2024-05-28 DIAGNOSIS — E1022 Type 1 diabetes mellitus with diabetic chronic kidney disease: Secondary | ICD-10-CM | POA: Diagnosis present

## 2024-05-28 DIAGNOSIS — Z8249 Family history of ischemic heart disease and other diseases of the circulatory system: Secondary | ICD-10-CM | POA: Diagnosis not present

## 2024-05-28 DIAGNOSIS — R079 Chest pain, unspecified: Secondary | ICD-10-CM | POA: Diagnosis not present

## 2024-05-28 DIAGNOSIS — Z833 Family history of diabetes mellitus: Secondary | ICD-10-CM | POA: Diagnosis not present

## 2024-05-28 DIAGNOSIS — Z803 Family history of malignant neoplasm of breast: Secondary | ICD-10-CM | POA: Diagnosis not present

## 2024-05-28 DIAGNOSIS — E875 Hyperkalemia: Secondary | ICD-10-CM | POA: Diagnosis present

## 2024-05-28 DIAGNOSIS — R7989 Other specified abnormal findings of blood chemistry: Secondary | ICD-10-CM | POA: Diagnosis not present

## 2024-05-28 DIAGNOSIS — E1043 Type 1 diabetes mellitus with diabetic autonomic (poly)neuropathy: Secondary | ICD-10-CM | POA: Diagnosis present

## 2024-05-28 LAB — CBG MONITORING, ED
Glucose-Capillary: 222 mg/dL — ABNORMAL HIGH (ref 70–99)
Glucose-Capillary: 134 mg/dL — ABNORMAL HIGH (ref 70–99)
Glucose-Capillary: 35 mg/dL — CL (ref 70–99)
Glucose-Capillary: 38 mg/dL — CL (ref 70–99)
Glucose-Capillary: 129 mg/dL — ABNORMAL HIGH (ref 70–99)
Glucose-Capillary: 36 mg/dL — CL (ref 70–99)
Glucose-Capillary: 170 mg/dL — ABNORMAL HIGH (ref 70–99)
Glucose-Capillary: 61 mg/dL — ABNORMAL LOW (ref 70–99)
Glucose-Capillary: 194 mg/dL — ABNORMAL HIGH (ref 70–99)
Glucose-Capillary: 162 mg/dL — ABNORMAL HIGH (ref 70–99)

## 2024-05-28 LAB — CBC
HCT: 24.8 % — ABNORMAL LOW (ref 36.0–46.0)
Hemoglobin: 8.1 g/dL — ABNORMAL LOW (ref 12.0–15.0)
MCH: 30.5 pg (ref 26.0–34.0)
MCHC: 32.7 g/dL (ref 30.0–36.0)
MCV: 93.2 fL (ref 80.0–100.0)
Platelets: 125 K/uL — ABNORMAL LOW (ref 150–400)
RBC: 2.66 MIL/uL — ABNORMAL LOW (ref 3.87–5.11)
RDW: 11.4 % — ABNORMAL LOW (ref 11.5–15.5)
WBC: 2.4 K/uL — ABNORMAL LOW (ref 4.0–10.5)
nRBC: 0 % (ref 0.0–0.2)

## 2024-05-28 LAB — ECHOCARDIOGRAM COMPLETE
AR max vel: 2.57 cm2
AV Area VTI: 2.85 cm2
AV Area mean vel: 2.82 cm2
AV Mean grad: 3 mmHg
AV Peak grad: 5.5 mmHg
Ao pk vel: 1.17 m/s
Area-P 1/2: 5.79 cm2
Height: 61 in
MV VTI: 2.91 cm2
S' Lateral: 2.8 cm
Weight: 1888 [oz_av]

## 2024-05-28 LAB — HEMOGLOBIN A1C
Hgb A1c MFr Bld: 7.1 % — ABNORMAL HIGH (ref 4.8–5.6)
Mean Plasma Glucose: 157.07 mg/dL

## 2024-05-28 LAB — URINALYSIS, ROUTINE W REFLEX MICROSCOPIC
Bilirubin Urine: NEGATIVE
Glucose, UA: NEGATIVE mg/dL
Hgb urine dipstick: NEGATIVE
Ketones, ur: NEGATIVE mg/dL
Nitrite: NEGATIVE
Protein, ur: 100 mg/dL — AB
RBC / HPF: >50 RBC/hpf (ref 0–5)
Specific Gravity, Urine: 1.014 (ref 1.005–1.030)
WBC, UA: >50 WBC/hpf (ref 0–5)
pH: 5 (ref 5.0–8.0)

## 2024-05-28 LAB — BASIC METABOLIC PANEL WITH GFR
Anion gap: 6 (ref 5–15)
BUN: 33 mg/dL — ABNORMAL HIGH (ref 6–20)
CO2: 28 mmol/L (ref 22–32)
Calcium: 8.5 mg/dL — ABNORMAL LOW (ref 8.9–10.3)
Chloride: 105 mmol/L (ref 98–111)
Creatinine, Ser: 2.59 mg/dL — ABNORMAL HIGH (ref 0.44–1.00)
GFR, Estimated: 24 mL/min — ABNORMAL LOW
Glucose, Bld: 190 mg/dL — ABNORMAL HIGH (ref 70–99)
Potassium: 5.6 mmol/L — ABNORMAL HIGH (ref 3.5–5.1)
Sodium: 139 mmol/L (ref 135–145)

## 2024-05-28 LAB — D-DIMER, QUANTITATIVE: D-Dimer, Quant: 0.53 ug{FEU}/mL — ABNORMAL HIGH (ref 0.00–0.50)

## 2024-05-28 LAB — PREGNANCY, URINE: Preg Test, Ur: NEGATIVE

## 2024-05-28 MED ORDER — INSULIN ASPART 100 UNIT/ML FLEXPEN: 2.0000 [IU] | PEN_INJECTOR | Freq: Three times a day (TID) | SUBCUTANEOUS | Status: DC

## 2024-05-28 MED ORDER — INSULIN ASPART 100 UNIT/ML IJ SOLN: 2.0000 [IU] | Freq: Three times a day (TID) | INTRAMUSCULAR | Status: DC

## 2024-05-28 MED ORDER — DEXTROSE 50 % IV SOLN: 50.0000 mL | Freq: Once | INTRAVENOUS | Status: AC

## 2024-05-28 MED ORDER — MORPHINE SULFATE (PF) 2 MG/ML IV SOLN: 2.0000 mg | INTRAVENOUS | Status: DC | PRN

## 2024-05-28 MED ORDER — OSELTAMIVIR PHOSPHATE 75 MG PO CAPS: 75.0000 mg | ORAL_CAPSULE | Freq: Two times a day (BID) | ORAL | Status: DC

## 2024-05-28 MED ORDER — DEXTROSE 50 % IV SOLN
INTRAVENOUS | Status: AC
  Administered 2024-05-28: 50 mL via INTRAVENOUS
  Filled 2024-05-28: qty 50

## 2024-05-28 MED ORDER — NITROGLYCERIN 0.4 MG SL SUBL: 0.4000 mg | SUBLINGUAL_TABLET | SUBLINGUAL | Status: DC | PRN

## 2024-05-28 MED ORDER — ASPIRIN 81 MG PO TBEC: 81.0000 mg | DELAYED_RELEASE_TABLET | Freq: Every day | ORAL | Status: DC

## 2024-05-28 MED ORDER — SODIUM ZIRCONIUM CYCLOSILICATE 10 G PO PACK
10.0000 g | PACK | Freq: Three times a day (TID) | ORAL | Status: AC
  Administered 2024-05-28 (×2): 10 g via ORAL
  Filled 2024-05-28 (×3): qty 1

## 2024-05-28 MED ORDER — METOPROLOL TARTRATE 5 MG/5ML IV SOLN
2.5000 mg | Freq: Once | INTRAVENOUS | Status: AC
  Administered 2024-05-28: 2.5 mg via INTRAVENOUS
  Filled 2024-05-28: qty 5

## 2024-05-28 MED ORDER — GUAIFENESIN ER 600 MG PO TB12
600.0000 mg | ORAL_TABLET | Freq: Two times a day (BID) | ORAL | Status: DC
  Administered 2024-05-28 – 2024-05-30 (×6): 600 mg via ORAL
  Filled 2024-05-28 (×6): qty 1

## 2024-05-28 MED ORDER — INSULIN ASPART 100 UNIT/ML IJ SOLN
0.0000 [IU] | INTRAMUSCULAR | Status: DC
  Administered 2024-05-29: 1 [IU] via SUBCUTANEOUS
  Administered 2024-05-29: 4 [IU] via SUBCUTANEOUS
  Administered 2024-05-29: 3 [IU] via SUBCUTANEOUS
  Administered 2024-05-29 – 2024-05-30 (×2): 2 [IU] via SUBCUTANEOUS
  Filled 2024-05-28 (×2): qty 1
  Filled 2024-05-28: qty 4
  Filled 2024-05-28: qty 3
  Filled 2024-05-28 (×2): qty 2

## 2024-05-28 MED ORDER — OSELTAMIVIR PHOSPHATE 30 MG PO CAPS
30.0000 mg | ORAL_CAPSULE | Freq: Every day | ORAL | Status: DC
  Administered 2024-05-28 – 2024-05-30 (×3): 30 mg via ORAL
  Filled 2024-05-28 (×3): qty 1

## 2024-05-28 MED ORDER — SODIUM CHLORIDE 0.9 % IV BOLUS
250.0000 mL | Freq: Once | INTRAVENOUS | Status: AC
  Administered 2024-05-28: 250 mL via INTRAVENOUS

## 2024-05-28 MED ORDER — DEXTROSE 50 % IV SOLN
1.0000 | INTRAVENOUS | Status: DC | PRN
  Administered 2024-05-28 – 2024-05-29 (×2): 50 mL via INTRAVENOUS
  Filled 2024-05-28 (×3): qty 50

## 2024-05-28 NOTE — Assessment & Plan Note (Signed)
-   This is AKI superimposed on stage IIIb chronic kidney disease. - This is like secondary to diminished p.o. intake and volume depletion with dehydration. - She will be hydrated with IV normal saline and will follow BMP. - Will avoid nephrotoxins.

## 2024-05-28 NOTE — Consult Note (Signed)
 "  Cardiology Consultation:   Patient ID: Ashley Camacho; [id number]; [birth year]   Admit date: 05/27/2024 Date of Consult: 05/28/2024  Primary Care Provider: Ostwalt, [name], PA-C Primary Cardiologist: Rolan Primary Electrophysiologist:  None   Patient Profile:   Ashley Camacho is a 34 y.o. female with a hx of nonobstructive CAD by LHC in 2023 at [facility], HFrEF secondary to NICM, CKD stage IV, PEA arrest following EGD in 2023, mitral regurgitation, type 1 diabetes with history of DKA, diabetic gastroparesis and neuropathy, adrenal insufficiency, right upper extremity DVT in 2022, enterococcal bacteremia in 07/2021, anemia of chronic disease, thrombocytopenia, hypothyroidism due to Hashimoto thyroiditis, and chronic migraine admitted with influenza A who is being seen today for the evaluation of chest pain at the request of Dr. Lawence.  History of Present Illness:   [name] underwent echo in 11/2020, following the birth of her second daughter showed an EF of 40 to 45% currently declining to 25% x 06/2021 with question of possible peripartum cardiomyopathy.  Cardiac cath in 06/2021 at [facility] showed no significant CAD.  She had AKI in 07/2021 requiring transient dialysis with admission further uremia.  She was admitted in 10/2023 with possible pneumonia with echo at that time showing an EF of 55 to 60%, normal diastolic function, normal RV, mild to moderate mitral regurgitation, and normal IVC.  However, it was felt the echo did not match clinical picture with 1 month later.  She was admitted in 12/2023 with CHF and diuresed with Lasix  drip with admission complicated by AKI.  Cardiac MRI showed mild LV dilatation with an LVEF of 35%, RVEF 34%, no delayed enhancement, and possible severe mitral regurgitation.  Following diuresis TEE was performed which showed an EF of 35 to 40% and only trivial mitral regurgitation and normal IVC.  She was admitted in 01/2024 with hyperkalemia.  She was admitted in 03/2024 with  hyperkalemia and hypoglycemia.  She has historically been followed by the CHF clinic.   She presented to [facility] on 05/27/2024 with a 10-day history of congestion and cough associated with clear sputum production subsequently developing upper left-sided chest discomfort that is reproducible to palpation.  Pain is not worse when laying supine.  She is without shortness of breath, lower extremity swelling, or progressive orthopnea.  Has not needed any as needed furosemide  recently.  Does not feel like she is holding onto fluid.  In the setting of the above she has noted diminished oral intake.  She was found to be influenza A positive with a Tmax of 102.5 in the ED.  Chest x-ray without active cardiopulmonary disease.  Notable labs: Influenza A positive, initial high-sensitivity troponin 25 with a delta troponin 23, D-dimer 0.53, potassium 6.3 trending to 5.6, BUN 37 trending to 33, serum creatinine 2.3 trending to 2.59, Hgb 10.4 trending to 8.1, WBC 3.3 trending to 2.4, PLT 157 trending to 125.  In the ED she received calcium  gluconate, insulin , Lokelma , sodium bicarb, acetaminophen , and IV metoprolol  2.5 mg.    Past Medical History:  Diagnosis Date   Acute metabolic encephalopathy 06/10/2022   Adrenal insufficiency    Anemia    Anxiety    Blood transfusion without reported diagnosis    CHF (congestive heart failure) (HCC)    Chronic kidney disease    Depression    Gastroparesis    Hypertension    Hypotension    Intractable nausea and vomiting 11/13/2022   Migraine    Thyroid  disease    Type 1 diabetes (  [facility])     Past Surgical History:  Procedure Laterality Date   BIOPSY  01/14/2021   Procedure: BIOPSY;  Surgeon: Eda Iha, MD;  Location: [facility] ENDOSCOPY;  Service: Gastroenterology;;   CENTRAL LINE INSERTION N/A 10/10/2023   Procedure: CENTRAL LINE INSERTION;  Surgeon: Marea Selinda RAMAN, MD;  Location: [facility] INVASIVE CV LAB;  Service: Cardiovascular;  Laterality: N/A;   CESAREAN SECTION      x2   CESAREAN SECTION WITH BILATERAL TUBAL LIGATION N/A 02/17/2021   Procedure: CESAREAN SECTION WITH BILATERAL TUBAL LIGATION;  Surgeon: Barbra Lang PARAS, DO;  Location: [facility] LD ORS;  Service: Obstetrics;  Laterality: N/A;   ESOPHAGOGASTRODUODENOSCOPY (EGD) WITH PROPOFOL  N/A 01/14/2021   Procedure: ESOPHAGOGASTRODUODENOSCOPY (EGD) WITH PROPOFOL ;  Surgeon: Eda Iha, MD;  Location: [facility] ENDOSCOPY;  Service: Gastroenterology;  Laterality: N/A;   IR BONE MARROW BIOPSY & ASPIRATION  12/16/2022   IR REPLC DUODEN/JEJUNO TUBE PERCUT W/FLUORO  11/22/2022   JEJUNOSTOMY N/A 06/13/2022   Procedure: DEXTER;  Surgeon: Jordis Laneta FALCON, MD;  Location: [facility] ORS;  Service: General;  Laterality: N/A;   MOUTH SURGERY     TEE WITHOUT CARDIOVERSION N/A 01/06/2024   Procedure: ECHOCARDIOGRAM, TRANSESOPHAGEAL;  Surgeon: Rolan Ezra RAMAN, MD;  Location: [facility] ORS;  Service: Cardiovascular;  Laterality: N/A;   TUBAL LIGATION       Home Meds: Prior to Admission medications  Medication Sig Start Date End Date Taking? Authorizing Provider  acetaminophen  (TYLENOL ) 500 MG tablet Take 2 tablets (1,000 mg total) by mouth every 6 (six) hours as needed for headache, fever or moderate pain. 08/05/22  Yes Dorinda Drue DASEN, MD  furosemide  (LASIX ) 20 MG tablet Take 1 tablet (20 mg total) by mouth daily as needed. 02/17/24 05/27/24 Yes Hackney, Ellouise LABOR, FNP  hydrocortisone  (CORTEF ) 10 MG tablet Take 2 tablets (20 mg total) by mouth daily with breakfast AND 1 tablet (10 mg total) daily with supper. Patient taking differently: Take 15mg  by mouth daily with breakfast AND 1 tablet (5 mg total) daily with supper. 05/27/23  Yes Sreenath, Sudheer B, MD  insulin  aspart (NOVOLOG ) 100 UNIT/ML FlexPen Inject 2 Units into the skin 3 (three) times daily with meals. If eating and Blood Glucose (BG) 80 or higher inject 2 units for meal coverage and add correction dose per scale. If not eating, correction dose only. BG <150= 0 unit; BG 150-200= 1  unit; BG 201-250= 3 unit; BG 251-300= 5 unit; BG 301-350= 7 unit; BG 351-400= 9 unit; BG >400= 11 unit and Call Primary Care. 10/12/23  Yes [name], MD  levothyroxine  (SYNTHROID ) 75 MCG tablet TAKE 1 TABLET (75 MCG TOTAL) BY MOUTH DAILY AT 6 (SIX) AM. 12/06/23  Yes Ostwalt, [name], PA-C  metoprolol  succinate (TOPROL -XL) 25 MG 24 hr tablet TAKE 1 TABLET (25 MG TOTAL) BY MOUTH DAILY. 03/23/24  Yes Rolan Ezra RAMAN, MD  nortriptyline (PAMELOR) 25 MG capsule Take 25 mg by mouth at bedtime. Patient taking differently: Take 15 mg by mouth at bedtime. Pt taking 15mg  for 30 days after she will resume 25mg    Yes [provider]  NOVOLOG  100 UNIT/ML injection Inject 0-50 Units into the skin as needed for high blood sugar. FOR USE IN INSULIN  PUMP. MAX 50 UNITS PER DAY. 04/14/24  Yes [provider]  rosuvastatin  (CRESTOR ) 10 MG tablet Take 1 tablet (10 mg total) by mouth daily. 01/18/24  Yes Hackney, Ellouise A, FNP  sacubitril -valsartan  (ENTRESTO ) 24-26 MG Take 1 tablet by mouth 2 (two) times daily. 02/28/24  Yes Rolan Ezra RAMAN, MD  sodium zirconium cyclosilicate  (LOKELMA ) 10 g PACK packet Take 10 g by mouth daily. 02/28/24  Yes Rolan Ezra RAMAN, MD  Accu-Chek Softclix Lancets lancets Use to check blood sugar up to 6 times daily. May substitute to any manufacturer covered by patient's insurance. 01/19/24   Ostwalt, [name], PA-C  BD PEN NEEDLE MINI ULTRAFINE 31G X 5 MM MISC by Other route. 07/18/23 07/17/24  [provider]  Blood Glucose Monitoring Suppl (BLOOD GLUCOSE MONITOR SYSTEM) w/Device KIT Use to check blood sugar up to 6 times daily. May substitute to any manufacturer covered by patient's insurance. 01/19/24   Ostwalt, [name], PA-C  Continuous Glucose Sensor (DEXCOM G7 SENSOR) MISC Use to check blood sugar. Change every 10 days 01/19/24   Ostwalt, [name], PA-C  Glucose Blood (BLOOD GLUCOSE TEST STRIPS) STRP Use to check blood sugar up to 6 times daily. May substitute to any  manufacturer covered by patient's insurance. 01/19/24   Ostwalt, [name], PA-C  Insulin  Pen Needle (PEN NEEDLES) 31G X 6 MM MISC PEN NEEDLES 31G X 6 MM 05/31/17   [provider]  Lancet Device MISC Use to check blood sugar up to 6 times daily. May substitute to any manufacturer covered by patient's insurance. 01/19/24   Ostwalt, [name], PA-C  Pancrelipase , Lip-Prot-Amyl, 24000-76000 units CPEP Take 1 capsule (24,000 Units total) by mouth 3 (three) times daily before meals. Patient not taking: Reported on 05/27/2024 11/04/23   [name], MD    Inpatient Medications: Scheduled Meds:  aspirin  EC  81 mg Oral Daily   enoxaparin  (LOVENOX ) injection  30 mg Subcutaneous Q24H   guaiFENesin   600 mg Oral BID   insulin  aspart  0-15 Units Subcutaneous TID WC   insulin  aspart  0-5 Units Subcutaneous QHS   levothyroxine   75 mcg Oral Q0600   lipase/protease/amylase  24,000 Units Oral TID AC   metoprolol  succinate  25 mg Oral Daily   rosuvastatin   10 mg Oral Daily   sodium zirconium cyclosilicate   10 g Oral Daily   Continuous Infusions:  sodium chloride  100 mL/hr at 05/28/24 0659   PRN Meds: acetaminophen  **OR** acetaminophen , magnesium  hydroxide, morphine  injection, nitroGLYCERIN , ondansetron  **OR** ondansetron  (ZOFRAN ) IV, traZODone   Allergies:  Allergies[1]  Social History:   Social History   Socioeconomic History   Marital status: Single    Spouse name: Not on file   Number of children: Not on file   Years of education: Not on file   Highest education level: 9th grade  Occupational History   Occupation: home maker  Tobacco Use   Smoking status: Never    Passive exposure: Never   Smokeless tobacco: Never  Vaping Use   Vaping status: Never Used  Substance and Sexual Activity   Alcohol use: No   Drug use: No   Sexual activity: Not Currently    Birth control/protection: None  Other Topics Concern   Not on file  Social History Narrative   Not on file   Social Drivers of Health    Tobacco Use: Low Risk (05/27/2024)   Patient History    Smoking Tobacco Use: Never    Smokeless Tobacco Use: Never    Passive Exposure: Never  Financial Resource Strain: Medium Risk (04/11/2024)   Overall Financial Resource Strain (CARDIA)    Difficulty of Paying Living Expenses: Somewhat hard  Food Insecurity: Food Insecurity Present (04/11/2024)   Epic    Worried About Radiation Protection Practitioner of Food in the Last Year: Sometimes true  Ran Out of Food in the Last Year: Sometimes true  Transportation Needs: No Transportation Needs (04/11/2024)   Epic    Lack of Transportation (Medical): No    Lack of Transportation (Non-Medical): No  Physical Activity: Insufficiently Active (04/11/2024)   Exercise Vital Sign    Days of Exercise per Week: 3 days    Minutes of Exercise per Session: 20 min  Stress: Stress Concern Present (04/11/2024)   [employer] of Occupational Health - Occupational Stress Questionnaire    Feeling of Stress: Rather much  Social Connections: Socially Isolated (04/11/2024)   Social Connection and Isolation Panel    Frequency of Communication with Friends and Family: More than three times a week    Frequency of Social Gatherings with Friends and Family: More than three times a week    Attends Religious Services: Never    Database Administrator or Organizations: No    Attends Engineer, Structural: Not on file    Marital Status: Never married  Intimate Partner Violence: Unknown (03/26/2024)   Epic    Fear of Current or Ex-Partner: No    Emotionally Abused: No    Physically Abused: Not on file    Sexually Abused: No  Depression (PHQ2-9): High Risk (01/18/2024)   Depression (PHQ2-9)    PHQ-2 Score: 14  Alcohol Screen: Low Risk (04/11/2024)   Alcohol Screen    Last Alcohol Screening Score (AUDIT): 1  Housing: High Risk (04/11/2024)   Epic    Unable to Pay for Housing in the Last Year: Yes    Number of Times Moved in the Last Year: 1    Homeless in the Last  Year: No  Utilities: Not At Risk (03/26/2024)   Epic    Threatened with loss of utilities: No  Health Literacy: Adequate Health Literacy (12/21/2023)   B1300 Health Literacy    Frequency of need for help with medical instructions: Never     Family History:   Family History  Problem Relation Age of Onset   Breast cancer Mother    Seizures Mother    Cancer Mother    Diabetes type I Father    CAD Father    Diabetes Father    Early death Father    Lung cancer Maternal Grandfather    CAD Paternal Grandmother    Diabetes Paternal Grandmother    CAD Paternal Grandfather    Diabetes Paternal Grandfather    ADD / ADHD Daughter    Diabetes Maternal Uncle    Ovarian cancer Neg Hx     ROS:  Review of Systems  Constitutional:  Positive for chills, fever and malaise/fatigue. Negative for diaphoresis and weight loss.  HENT:  Positive for congestion.   Eyes:  Negative for discharge and redness.  Respiratory:  Positive for cough and sputum production. Negative for hemoptysis, shortness of breath and wheezing.   Cardiovascular:  Positive for chest pain. Negative for palpitations, orthopnea, claudication, leg swelling and PND.  Gastrointestinal:  Negative for abdominal pain, heartburn, nausea and vomiting.  Musculoskeletal:  Positive for myalgias. Negative for falls.  Skin:  Negative for rash.  Neurological:  Positive for weakness. Negative for dizziness, tingling, tremors, sensory change, speech change, focal weakness and loss of consciousness.  Endo/Heme/Allergies:  Does not bruise/bleed easily.  Psychiatric/Behavioral:  Negative for substance abuse. The patient is not nervous/anxious.   All other systems reviewed and are negative.     Physical Exam/Data:   Vitals:   05/28/24 0630 05/28/24 0640 05/28/24  0645 05/28/24 0700  BP: 94/66   (!) 89/72  Pulse: 96  92 92  Resp: 10  (!) 26 (!) 29  Temp:  (!) 101.1 F (38.4 C)    TempSrc:  Oral    SpO2: 99%  100% 99%  Weight:      Height:        No intake or output data in the 24 hours ending 05/28/24 0750 Filed Weights   05/27/24 1946  Weight: 53.5 kg   Body mass index is 22.3 kg/m.   Physical Exam: General: Well developed, well nourished, in no acute distress. Head: Normocephalic, atraumatic, sclera non-icteric, no xanthomas, nares without discharge.  Neck: Negative for carotid bruits. JVD not elevated. Lungs: Clear bilaterally to auscultation without wheezes, rales, or rhonchi. Breathing is unlabored. Heart: RRR with S1 S2. I/VI systolic murmur LUSB, no rubs, or gallops appreciated. Abdomen: Soft, non-tender, non-distended with normoactive bowel sounds. No hepatomegaly. No rebound/guarding. No obvious abdominal masses. Msk:  Strength and tone appear normal for age. Extremities: No clubbing or cyanosis. No edema. Distal pedal pulses are 2+ and equal bilaterally. Neuro: Alert and oriented X 3. No facial asymmetry. No focal deficit. Moves all extremities spontaneously. Psych:  Responds to questions appropriately with a normal affect.   EKG:  The EKG was personally reviewed and demonstrates: sinus tachycardia, 118 bpm, global T wave inversion consistent with prior tracings  Telemetry:  Telemetry was personally reviewed and demonstrates: Sinus rhythm with sinus tachycardia with rates in the 80s to 1 teens bpm  Weights: Filed Weights   05/27/24 1946  Weight: 53.5 kg    Relevant CV Studies:  Echo pending  Laboratory Data:  Chemistry Recent Labs  Lab 05/27/24 1958 05/28/24 0436  NA 138 139  K 6.3* 5.6*  CL 101 105  CO2 24 28  GLUCOSE 142* 190*  BUN 37* 33*  CREATININE 2.30* 2.59*  CALCIUM  9.1 8.5*  GFRNONAA 28* 24*  ANIONGAP 13 6    No results for input(s): PROT, ALBUMIN , AST, ALT, ALKPHOS, BILITOT in the last 168 hours. Hematology Recent Labs  Lab 05/27/24 1958 05/28/24 0436  WBC 3.3* 2.4*  RBC 3.38* 2.66*  HGB 10.4* 8.1*  HCT 31.4* 24.8*  MCV 92.9 93.2  MCH 30.8 30.5  MCHC 33.1  32.7  RDW 11.4* 11.4*  PLT 157 125*   Cardiac EnzymesNo results for input(s): TROPONINI in the last 168 hours. No results for input(s): TROPIPOC in the last 168 hours.  BNPNo results for input(s): BNP, PROBNP in the last 168 hours.  DDimer  Recent Labs  Lab 05/28/24 0230  DDIMER 0.53*    Radiology/Studies:  DG Chest 2 View Result Date: 05/27/2024 IMPRESSION: 1. No active cardiopulmonary disease. Electronically signed by: Franky Crease MD 05/27/2024 08:16 PM EST RP Workstation: [id number]    Assessment and Plan:   1. Nonobstructive CAD with mildly elevated high-sensitivity troponin and chest pain:  - Chest pain is reproducible to her palpation of the left upper anterior chest wall likely secondary MSK etiology from coughing - Minimally elevated and flat trending high-sensitivity troponin, not consistent with ACS  - No indication for heparin  drip with flat troponin and underlying anemia/thrombocytopenia  - Obtain echo to evaluate for possible pericardial effusion given acute influenza A infection, though his symptoms are not consistent with myopericarditis  - No plans for invasive inpatient ischemic evaluation at this time with flat trending troponin, CKD stage IV, thrombocytopenia, and underlying anemia  - Defer evaluation of elevated D-dimer to primary service  -  Hold aspirin   - PTA rosuvastatin    2. HFrEF secondary to NICM:  - Euvolemic to possibly volume depleted, receiving IV fluids - Obtain echo as above  - Hold metoprolol  for now with underlying hypotension  - With recurrent hyperkalemia, avoid ACE inhibitor, ARB, ARNI, MRA  - Does not require IV diuresis at this time   3. Influenza A with pancytopenia:  - Management per primary service  - Leukopenia and thrombocytopenia likely in setting of acute viral infection   4. CKD stage IV complicated by hyperkalemia with anemia of chronic disease:  - Avoid nephrotoxic agents  - Maintain hemoglobin greater than 8   5.  Type 1 diabetes complicated by diabetic gastroparesis and diabetic neuropathy:  - Management per primary service      For questions or updates, please contact [facility] Please consult [email or web address] for contact info under Cardiology/STEMI.   Signed, Bernardino Bring, PA-C [facility] Pager: [phone] 05/28/2024, 7:50 AM       [1] No Known Allergies  "

## 2024-05-28 NOTE — ED Notes (Signed)
 Meds given with apple sauce per preference

## 2024-05-28 NOTE — ED Notes (Signed)
 Urine cloudy, sent for UA and previously ordered upreg.

## 2024-05-28 NOTE — Progress Notes (Signed)
*  PRELIMINARY RESULTS* Echocardiogram 2D Echocardiogram has been performed.  Floydene Harder 05/28/2024, 2:29 PM

## 2024-05-28 NOTE — ED Notes (Signed)
 Emergency dextrose  given IV and pt provided orange juice. Admission MD and Primary RN made aware.

## 2024-05-28 NOTE — ED Notes (Signed)
 Vomited, feels better, denies current nausea, up to b/r, urine sample obtained and sent. Gown and bed change. US  at [facility], US  in progress.

## 2024-05-28 NOTE — Progress Notes (Signed)
 PHARMACY NOTE:  ANTIMICROBIAL/ANTIVIRAL RENAL DOSAGE ADJUSTMENT  Current antimicrobial/antiviral regimen includes a mismatch between antimicrobial/antiviral dosage and estimated renal function.  As per policy approved by the Pharmacy & Therapeutics and Medical Executive Committees, the antimicrobial/antiviral dosage will be adjusted accordingly.  Current antimicrobial/anitiviral dosage:  Tamiflu  75mg  po BID  Indication: flu+  Renal Function:  Estimated Creatinine Clearance: 23.3 mL/min (A) (by C-G formula based on SCr of 2.59 mg/dL (H)).     Antimicrobial/antiviral dosage has been changed to:  Tamiflu  30 mg po DAILY  Additional comments:   Thank you for allowing pharmacy to be a part of this patient's care.  Allean Haas PharmD Clinical Pharmacist 05/28/2024

## 2024-05-28 NOTE — ED Notes (Signed)
 Patient AOX4 at this moment. Oral Tylenol  PRN order given. Removed all but 1 blanket at this time. Ice water  given to patient per request.

## 2024-05-28 NOTE — ED Notes (Addendum)
 Breakfast tray delivered. Denies questions, needs sx, or complaints. Resting comfortably. Pending US . BP soft/ low, MAP 73, VSS, will monitor.

## 2024-05-28 NOTE — ED Notes (Signed)
 Pt stating she needed to use the restroom. EDT assisted pt to toilet and then back to bed. Pt ambulated well with slight complaint of dizziness.

## 2024-05-28 NOTE — Inpatient Diabetes Management (Addendum)
 Inpatient Diabetes Program Recommendations  AACE/ADA: New Consensus Statement on Inpatient Glycemic Control  Target Ranges:  Prepandial:   less than 140 mg/dL      Peak postprandial:   less than 180 mg/dL (1-2 hours)      Critically ill patients:  140 - 180 mg/dL    Latest Reference Range & Units 05/27/24 21:04 05/27/24 23:36 05/28/24 08:11 05/28/24 10:28  Glucose-Capillary 70 - 99 mg/dL 863 (H)  Novolog  5 units; D50 50 ml 78 222 (H) 134 (H)    Review of Glycemic Control  Diabetes history: DM1 Outpatient Diabetes medications: OmniPod insulin  pump with Novolog , Dexcom G7; off pump plan Lantus  11 units daily, Novolog   1 unit per 20 grams of carbs plus correction of 1:70 mg/dl Current orders for Inpatient glycemic control: Novolog  0-15 units TID with meals, Novolog  2 units TID with meals  Inpatient Diabetes Program Recommendations:    Insulin : Please decrease Novolog  correction to 0-6 units and change frequency to Q4H.  If CBGs become consistently over 180 mg/dl with Novolog  correction Q4H, please consider ordering insulin  glargine 5 units Q24H.  Outpatient DM: At time of discharge please provide Rx for Lantus  Solostar pens [phone]).  NOTE: Patient in ED on 05/27/24 with chest pain, hyperkalemia, AKI on CKD, and chronic CHF. Patient has Type 1 DM and uses an insulin  pump outpatient for DM control. Patient is well known to inpatient diabetes team due to frequent ED visits and hospital admissions. Patient was last inpatient 11/3-11/4/25.    Addendum 05/28/24@13 :09-Spoke to patient at bedside regarding DM. Patient states that she has not used her insulin  pump in several days due to having frequent hypoglycemia on pump. Patient reports that she has most recently been using Lantus  11 units daily and Novolog  for correction and carb coverage. Patient states that she has not taken Lantus  in a couple days due to poor appetite and feeling bad. Patient states she has still been using her Novolog  insulin .  Patient does not currently have on Dexcom G7 CGM sensor but reports she has plenty of sensors at home.  Patient states that she will need Rx for Lantus  pens at discharge (since she is not using her insulin  pump she will need basal insulin ). Patient seen Dr. Carlin (Endocrinology) on 03/30/24. Per office note on 03/30/24,  Pump settings: Basal 0.45 units per hour ICR 25 ISF 85 Target 120 Correct above 130 AIT 4 hours   Pump backup plan: Lantus  11 units daily  Novolog  ICR 20, ISF 70   Patient states that she had an appointment scheduled with Dr. Carlin for today. Asked patient to call Endocrinology office to make them aware that she is at the hospital and not able to make appointment today.  Patient reports that she felt symptoms of hypoglycemia when CBG 38 mg/dl at 87:49 today. Patient currently feels better. Discussed current insulin  orders and recommendation to made to decrease Novolog  correction scale and change to Q4H; then could add low dose basal insulin  if CBGs consistently elevated on Novolog  correction Q4H. Patient states that she is not eating and does not have an appetite at this time.  Patient verbalized understanding of information and she has no questions at this time. Talked with RN at bedside. Sent chat message to Dr. Lenon.  Thanks, Earnie Gainer, RN, MSN, CDCES Diabetes Coordinator Inpatient Diabetes Program [phone] (Team Pager from 8am to 5pm)

## 2024-05-28 NOTE — Progress Notes (Signed)
 " PROGRESS NOTE Ashley Camacho    DOB: [birth year], 34 y.o.  FMW:[id number]    Code Status: Full Code   DOA: 05/27/2024   LOS: 0  Brief hospital course  Ashley Camacho is a 34 y.o. female with a PMH significant for anxiety, depression, hypertension, type 1 diabetes mellitus and hypothyroidism and migraine. They presented with acute onset of chest pain. She has been having associated cough and chest congestion with her intermittent chest pain over the last week.    ED Course: Vital signs were within normal. hyperkalemia of 6.3 with a BUN of 37 and creatinine 2.3 compared to 37 and 1.94 on 04/09/24.  High sensitive troponin T was 25 and later 23.  CBC showed leukopenia of 3.3 and hemoglobin 10.4 hematocrit 31.4 above previous levels. EKG: sinus tachycardia with rate 118 with Q waves anteroseptally and T wave inversion inferiorly and laterally. Imaging: 2 view chest x-ray showed no acute cardiopulmonary disease.   The patient was given 10 g of p.o. Lokelma , IV D50/insulin , sodium bicarb ampule and 1 g of IV gluconate.  She will be admitted to an observation progressive unit bed for further evaluation and management.  05/28/2024 -respiratory status stable. Glucose labile.   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 Reproducible to palpation. Suspect in relation to influenza infection and coughing. Mildly elevated troponins trended flat.  - cardiology has evaluated - echo pending read  Influenza positive- currently ORA - tamilflu and supportive care   Hyperkalemia- improved. K+ 6.3>5.6.  - receiving insulin  and lokelma  - BMP am    Hypothyroidism - continue Synthroid .   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 - s/p IVF hydration. 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 She has extremely labile blood sugars as I have seen on previous admissions and can easily become hypoglycemic with seemingly minimal insulin .  - diabetes coordinator   Pancreatic insufficiency - Will continue Creon .   Chronic diastolic CHF (congestive heart failure) (HCC) - Will continue Toprol -XL. - Will hold off Entresto  given her hyperkalemia.   Dyslipidemia - Will continue statin therapy.  Body mass index is 22.3 kg/m.  VTE ppx: enoxaparin  (LOVENOX ) injection 30 mg Start: 05/27/24 2230   Diet:     Diet   Diet Carb Modified Room service appropriate? Yes   Consultants: Cardiology   Subjective 05/28/2024    Pt reports feeling improved. Chest tenderness with deep breaths and coughs. Denies nausea, diaphoresis    Objective  Blood pressure (!) 89/72, pulse 92, temperature (!) 101.1 F (38.4 C), temperature source Oral, resp. rate (!) 29, height 5' 1 (1.549 m), weight 53.5 kg, SpO2 99%. No intake or output data in the 24 hours ending 05/28/24 0751 Filed Weights   05/27/24 1946  Weight: 53.5 kg    Physical Exam:  General: awake, alert, NAD HEENT: atraumatic, clear conjunctiva, anicteric sclera, MMM, hearing grossly normal Respiratory: normal respiratory effort. Cardiovascular: extremities well perfused, quick capillary refill, normal S1/S2, RRR, no JVD, murmurs Gastrointestinal: soft, NT, ND Nervous: A&O x3. no gross focal neurologic deficits,  normal speech Extremities: moves all equally, no edema, normal tone Skin: dry, intact, normal temperature, normal color. No rashes, lesions or ulcers on exposed skin Psychiatry: normal mood, congruent affect  Labs   I have personally reviewed the following labs and imaging studies CBC    Component  Value Date/Time   WBC 2.4 (L) 05/28/2024 0436   RBC 2.66 (L) 05/28/2024 0436   HGB 8.1 (L) 05/28/2024 0436   HGB 10.0 (L) 12/21/2023 1438   HCT 24.8 (L) 05/28/2024 0436   HCT 31.6 (L) 12/21/2023 1438   PLT 125 (L) 05/28/2024 0436   PLT 140 (L) 12/21/2023 1438   MCV 93.2 05/28/2024 0436   MCV 96 12/21/2023 1438   MCV 84 03/01/2013 1844   MCH 30.5 05/28/2024 0436   MCHC 32.7 05/28/2024 0436   RDW 11.4 (L) 05/28/2024 0436   RDW 16.5 (H) 12/21/2023 1438   RDW 13.0 03/01/2013 1844   LYMPHSABS 1.3 03/26/2024 0155   LYMPHSABS 1.8 12/21/2023 1438   MONOABS 0.2 03/26/2024 0155   EOSABS 0.1 03/26/2024 0155   EOSABS 0.2 12/21/2023 1438   BASOSABS 0.0 03/26/2024 0155   BASOSABS 0.0 12/21/2023 1438      Latest Ref Rng & Units 05/28/2024    4:36 AM 05/27/2024    7:58 PM 04/09/2024    4:32 PM  BMP  Glucose 70 - 99 mg/dL 809  857  852   BUN 6 - 20 mg/dL 33  37  37   Creatinine 0.44 - 1.00 mg/dL 7.40  7.69  8.05   Sodium 135 - 145 mmol/L 139  138  143   Potassium 3.5 - 5.1 mmol/L 5.6  6.3  4.7   Chloride 98 - 111 mmol/L 105  101  110   CO2 22 - 32 mmol/L 28  24  23    Calcium  8.9 - 10.3 mg/dL 8.5  9.1  8.5     DG Chest 2 View Result Date: 05/27/2024 EXAM: 2 VIEW(S) XRAY OF THE CHEST 05/27/2024 08:13:00 PM COMPARISON: 01/02/2024 CLINICAL HISTORY: CP FINDINGS: LUNGS AND PLEURA: No focal pulmonary opacity. No pleural effusion. No pneumothorax. HEART AND MEDIASTINUM: No acute abnormality of the cardiac and mediastinal silhouettes. BONES AND SOFT TISSUES: No acute osseous abnormality. IMPRESSION: 1. No active cardiopulmonary disease. Electronically signed by: Franky Crease MD 05/27/2024 08:16 PM EST RP Workstation: [id number]    Disposition Plan & Communication  Patient status: Observation  Admitted From: Home Planned disposition location: Home Anticipated discharge date: 1/6 pending clinical course  Family Communication: none at bedside     Author: Marien LITTIE Piety, DO [facility] 05/28/2024, 7:51 AM   Available by Epic secure chat 7AM-7PM. If 7PM-7AM, please contact night-coverage.  [facility] contact information found on [email or web address].  "

## 2024-05-28 NOTE — ED Notes (Signed)
 Pt not wearing dexcom sensor, or insulin  pump. DM coordinator/ educator at [facility]. Pt alert, NAD, calm, interactive. Eating/ drinking at this time.

## 2024-05-28 NOTE — ED Notes (Addendum)
 Cards MD into room, at [facility].

## 2024-05-28 NOTE — Progress Notes (Signed)
*  PRELIMINARY RESULTS* Echocardiogram 2D Echocardiogram has been performed.  Ashley Camacho 05/28/2024, 2:29 PM

## 2024-05-28 NOTE — ED Notes (Addendum)
 SABRA

## 2024-05-29 DIAGNOSIS — J101 Influenza due to other identified influenza virus with other respiratory manifestations: Secondary | ICD-10-CM | POA: Diagnosis not present

## 2024-05-29 LAB — BASIC METABOLIC PANEL WITH GFR
Anion gap: 11 (ref 5–15)
BUN: 26 mg/dL — ABNORMAL HIGH (ref 6–20)
CO2: 20 mmol/L — ABNORMAL LOW (ref 22–32)
Calcium: 7.3 mg/dL — ABNORMAL LOW (ref 8.9–10.3)
Chloride: 106 mmol/L (ref 98–111)
Creatinine, Ser: 2.44 mg/dL — ABNORMAL HIGH (ref 0.44–1.00)
GFR, Estimated: 26 mL/min — ABNORMAL LOW
Glucose, Bld: 221 mg/dL — ABNORMAL HIGH (ref 70–99)
Potassium: 5.1 mmol/L (ref 3.5–5.1)
Sodium: 138 mmol/L (ref 135–145)

## 2024-05-29 LAB — CBG MONITORING, ED
Glucose-Capillary: 180 mg/dL — ABNORMAL HIGH (ref 70–99)
Glucose-Capillary: 203 mg/dL — ABNORMAL HIGH (ref 70–99)
Glucose-Capillary: 196 mg/dL — ABNORMAL HIGH (ref 70–99)
Glucose-Capillary: 235 mg/dL — ABNORMAL HIGH (ref 70–99)
Glucose-Capillary: 35 mg/dL — CL (ref 70–99)
Glucose-Capillary: 193 mg/dL — ABNORMAL HIGH (ref 70–99)
Glucose-Capillary: 218 mg/dL — ABNORMAL HIGH (ref 70–99)
Glucose-Capillary: 284 mg/dL — ABNORMAL HIGH (ref 70–99)

## 2024-05-29 LAB — CBC
HCT: 23.2 % — ABNORMAL LOW (ref 36.0–46.0)
Hemoglobin: 7.7 g/dL — ABNORMAL LOW (ref 12.0–15.0)
MCH: 30.7 pg (ref 26.0–34.0)
MCHC: 33.2 g/dL (ref 30.0–36.0)
MCV: 92.4 fL (ref 80.0–100.0)
Platelets: 109 K/uL — ABNORMAL LOW (ref 150–400)
RBC: 2.51 MIL/uL — ABNORMAL LOW (ref 3.87–5.11)
RDW: 11.5 % (ref 11.5–15.5)
WBC: 2.6 K/uL — ABNORMAL LOW (ref 4.0–10.5)
nRBC: 0 % (ref 0.0–0.2)

## 2024-05-29 LAB — GLUCOSE, CAPILLARY
Glucose-Capillary: 171 mg/dL — ABNORMAL HIGH (ref 70–99)
Glucose-Capillary: 313 mg/dL — ABNORMAL HIGH (ref 70–99)

## 2024-05-29 MED ORDER — HYDROCORTISONE 10 MG PO TABS
10.0000 mg | ORAL_TABLET | Freq: Every day | ORAL | Status: DC
  Administered 2024-05-29: 10 mg via ORAL
  Filled 2024-05-29 (×2): qty 1

## 2024-05-29 MED ORDER — HYDROCORTISONE 5 MG PO TABS
15.0000 mg | ORAL_TABLET | Freq: Every day | ORAL | Status: DC
  Administered 2024-05-29 – 2024-05-30 (×2): 15 mg via ORAL
  Filled 2024-05-29 (×2): qty 1

## 2024-05-29 NOTE — Progress Notes (Signed)
 Heart Failure Navigator Progress Note  Assessed for Heart & Vascular TOC clinic readiness.  Patient is already established with the [facility].  Next follow-up scheduled for 06/07/24 @ 3:30 PM with Ellouise Class, FNP.  Navigator available for reassessment but will sign off at this time.  Charmaine Pines, RN, BSN [facility] Heart Failure Navigator Secure Chat Only

## 2024-05-29 NOTE — Progress Notes (Signed)
 " PROGRESS NOTE Ashley Camacho    DOB: 08-28-1990, 34 y.o.  FMW:[id number]    Code Status: Full Code   DOA: 05/27/2024   LOS: 1  Brief hospital course  Ashley Camacho is a 34 y.o. female with a PMH significant for anxiety, depression, hypertension, type 1 diabetes mellitus and hypothyroidism and migraine. They presented with acute onset of chest pain. She has been having associated cough and chest congestion with her intermittent chest pain over the last week.    ED Course: VSS. hyperkalemia of 6.3 with a BUN of 37 and creatinine 2.3 compared to 37 and 1.94 on 04/09/24.  High sensitive troponin T was 25 and later 23.  CBC showed leukopenia of 3.3 and hemoglobin 10.4 hematocrit 31.4 above previous levels. EKG: sinus tachycardia with rate 118 with Q waves anteroseptally and T wave inversion inferiorly and laterally. Imaging: 2 view chest x-ray showed no acute cardiopulmonary disease.   The patient was given 10 g of p.o. Lokelma , IV D50/insulin , sodium bicarb ampule and 1 g of IV gluconate.   05/29/2024 -respiratory status stable. Glucose is VERY labile as is well known to be her normal. ACS has been ruled out. Patient complains of post-tussive emesis today with streak of blood. Hgb stable but mildly decreased. Unfortunately she did not save it to be seen by staff.   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 Reproducible to palpation. Suspect in relation to influenza infection and coughing. Mildly elevated troponins trended flat. Echo unremarkable. Has improved with supportive care. 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 K+ 6.3>5.6>5.1 - received insulin  and lokelma  - BMP am    Hypothyroidism - continue Synthroid .   AKI superimposed on CKD IIIb- improved today to Cr 2.44 but not back to baseline yet around 2.  - Will avoid nephrotoxins. - s/p IVF hydration. 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 She has extremely labile blood sugars as I have seen on previous admissions and can easily become hypoglycemic with seemingly minimal insulin .  - diabetes coordinator  Acute on chronic anemia- Hgb decreased since admission from 10.4>>7.7. partially dilutional due to getting IVF. She reported some blood streaking in her post-tussive emesis today but flushed it and did not show staff.  - asked patient to save it if occurs again for heme occult testing and to approximate volume.  - monitor - CBC am   Pancreatic insufficiency - continue Creon .   Chronic diastolic CHF (congestive heart failure) (HCC) - continue Toprol -XL. - hold off Entresto  given her hyperkalemia.   Dyslipidemia - Will continue statin therapy.  Body mass index is 22.3 kg/m.  VTE ppx: enoxaparin  (LOVENOX ) injection 30 mg Start: 05/27/24 2230  Diet:     Diet   Diet Carb Modified Room service appropriate? Yes   Consultants: Cardiology   Subjective 05/29/2024    Pt reports feeling improved. Chest tenderness only  mild with cough now. Had cough this morning resulting in emesis which was blood tinged but did not save it or show it to staff. Flushed it. Asked her to save it.    Objective  Blood pressure (!) 89/72, pulse 92, temperature (!) 101.1 F (38.4 C), temperature source Oral, resp. rate (!) 29, height 5' 1 (1.549 m), weight 53.5 kg, SpO2 99%.  Intake/Output Summary (Last 24 hours) at 05/29/2024  9177 Last data filed at 05/29/2024 0551 Gross per 24 hour  Intake 2002.25 ml  Output --  Net 2002.25 ml   Filed Weights   05/27/24 1946  Weight: 53.5 kg    Physical Exam:  General: awake, alert, NAD HEENT: atraumatic, clear conjunctiva, anicteric sclera, MMM, hearing grossly normal Respiratory: normal respiratory effort. Cardiovascular: extremities well perfused, quick capillary refill, normal S1/S2, RRR, no JVD, murmurs Nervous: A&O x3. no gross focal neurologic deficits, normal speech Extremities: moves all equally, no edema, normal tone Skin: dry, intact, normal temperature, normal color. No rashes, lesions or ulcers on exposed skin Psychiatry: normal mood, congruent affect  Labs   I have personally reviewed the following labs and imaging studies CBC    Component Value Date/Time   WBC 2.4 (L) 05/28/2024 0436   RBC 2.66 (L) 05/28/2024 0436   HGB 8.1 (L) 05/28/2024 0436   HGB 10.0 (L) 12/21/2023 1438   HCT 24.8 (L) 05/28/2024 0436   HCT 31.6 (L) 12/21/2023 1438   PLT 125 (L) 05/28/2024 0436   PLT 140 (L) 12/21/2023 1438   MCV 93.2 05/28/2024 0436   MCV 96 12/21/2023 1438   MCV 84 03/01/2013 1844   MCH 30.5 05/28/2024 0436   MCHC 32.7 05/28/2024 0436   RDW 11.4 (L) 05/28/2024 0436   RDW 16.5 (H) 12/21/2023 1438   RDW 13.0 03/01/2013 1844   LYMPHSABS 1.3 03/26/2024 0155   LYMPHSABS 1.8 12/21/2023 1438   MONOABS 0.2 03/26/2024 0155   EOSABS 0.1 03/26/2024 0155   EOSABS 0.2 12/21/2023 1438   BASOSABS 0.0 03/26/2024 0155   BASOSABS 0.0 12/21/2023 1438      Latest Ref Rng & Units 05/29/2024    4:56 AM 05/28/2024    4:36 AM 05/27/2024    7:58 PM  BMP  Glucose 70 - 99 mg/dL 778  809  857   BUN 6 - 20 mg/dL 26  33  37   Creatinine 0.44 - 1.00 mg/dL 7.55  7.40  7.69   Sodium 135 - 145 mmol/L 138  139  138   Potassium 3.5 - 5.1 mmol/L 5.1  5.6  6.3   Chloride 98 - 111 mmol/L 106  105  101   CO2 22 - 32 mmol/L 20  28  24    Calcium  8.9 - 10.3 mg/dL 7.3  8.5  9.1      ECHOCARDIOGRAM COMPLETE Result Date: 05/28/2024    ECHOCARDIOGRAM REPORT   Patient Name:   Ashley Camacho Date of Exam: 05/28/2024 Medical Rec #:  [id number]       Height:       61.0 in Accession #:    [id number]      Weight:       118.0 lb Date of Birth:  05-Jun-1990      BSA:          1.509 m Patient Age:    33 years        BP:           73/47 mmHg Patient Gender: F  HR:           76 bpm. Exam Location:  [facility] Procedure: 2D Echo, Cardiac Doppler, Color Doppler, Strain Analysis and 3D Echo            (Both Spectral and Color Flow Doppler were utilized during            procedure). 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 CHF; Risk Factors:Hypertension.  Sonographer:     Christopher Furnace Referring Phys:  RYAN M DUNN Diagnosing Phys: Deatrice Cage MD IMPRESSIONS  1. Left ventricular ejection fraction, by estimation, is 40 to 45%. The left ventricle has mildly decreased function. The left ventricle demonstrates global hypokinesis. Left ventricular diastolic parameters are consistent with Grade I diastolic dysfunction (impaired relaxation).  2. Right ventricular systolic function is normal. The right ventricular size is normal. Tricuspid regurgitation signal is inadequate for assessing PA pressure.  3. The mitral valve is normal in structure. No evidence of mitral valve regurgitation. No evidence of mitral stenosis.  4. The aortic valve is normal in structure. Aortic valve regurgitation is not visualized. No aortic stenosis is present.  5. The inferior vena cava is normal in size with greater than 50% respiratory variability, suggesting right atrial pressure of 3 mmHg. FINDINGS  Left Ventricle: Left ventricular ejection fraction, by estimation, is 40 to 45%. The left ventricle has mildly decreased function. The left ventricle demonstrates global hypokinesis. Global longitudinal strain  performed but not reported based on interpreter judgement due to suboptimal tracking. The left ventricular internal cavity size was normal in size. There is no left ventricular hypertrophy. Left ventricular diastolic parameters are consistent with Grade I diastolic dysfunction (impaired relaxation). Right Ventricle: The right ventricular size is normal. No increase in right ventricular wall thickness. Right ventricular systolic function is normal. Tricuspid regurgitation signal is inadequate for assessing PA pressure. The tricuspid regurgitant velocity is 1.46 m/s, and with an assumed right atrial pressure of 3 mmHg, the estimated right ventricular systolic pressure is 11.5 mmHg. Left Atrium: Left atrial size was normal in size. Right Atrium: Right atrial size was normal in size. Pericardium: There is no evidence of pericardial effusion. Mitral Valve: The mitral valve is normal in structure. No evidence of mitral valve regurgitation. No evidence of mitral valve stenosis. MV peak gradient, 3.0 mmHg. The mean mitral valve gradient is 2.0 mmHg. Tricuspid Valve: The tricuspid valve is normal in structure. Tricuspid valve regurgitation is not demonstrated. No evidence of tricuspid stenosis. Aortic Valve: The aortic valve is normal in structure. Aortic valve regurgitation is not visualized. No aortic stenosis is present. Aortic valve mean gradient measures 3.0 mmHg. Aortic valve peak gradient measures 5.5 mmHg. Aortic valve area, by VTI measures 2.85 cm. Pulmonic Valve: The pulmonic valve was normal in structure. Pulmonic valve regurgitation is not visualized. No evidence of pulmonic stenosis. Aorta: The aortic root is normal in size and structure. Venous: The inferior vena cava is normal in size with greater than 50% respiratory variability, suggesting right atrial pressure of 3 mmHg. IAS/Shunts: No atrial level shunt detected by color flow Doppler.  LEFT VENTRICLE PLAX 2D LVIDd:         3.80 cm   Diastology LVIDs:          2.80 cm   LV e'  medial:    10.90 cm/s LV PW:         0.90 cm   LV E/e' medial:  6.3 LV IVS:        0.80 cm   LV e' lateral:   11.70 cm/s LVOT diam:     2.00 cm   LV E/e' lateral: 5.9 LV SV:         48 LV SV Index:   32 LVOT Area:     3.14 cm LV IVRT:       124 msec                          3D Volume EF:                          3D EF:        38 % RIGHT VENTRICLE RV Basal diam:  2.80 cm RV Mid diam:    2.80 cm LEFT ATRIUM             Index        RIGHT ATRIUM           Index LA diam:        2.20 cm 1.46 cm/m   RA Area:     10.10 cm LA Vol (A2C):   20.0 ml 13.25 ml/m  RA Volume:   20.90 ml  13.85 ml/m LA Vol (A4C):   19.8 ml 13.12 ml/m LA Biplane Vol: 20.7 ml 13.71 ml/m  AORTIC VALVE AV Area (Vmax):    2.57 cm AV Area (Vmean):   2.82 cm AV Area (VTI):     2.85 cm AV Vmax:           117.00 cm/s AV Vmean:          75.100 cm/s AV VTI:            0.170 m AV Peak Grad:      5.5 mmHg AV Mean Grad:      3.0 mmHg LVOT Vmax:         95.80 cm/s LVOT Vmean:        67.400 cm/s LVOT VTI:          0.154 m LVOT/AV VTI ratio: 0.91  AORTA Ao Root diam: 2.60 cm MITRAL VALVE               TRICUSPID VALVE MV Area (PHT): 5.79 cm    TR Peak grad:   8.5 mmHg MV Area VTI:   2.91 cm    TR Vmax:        146.00 cm/s MV Peak grad:  3.0 mmHg MV Mean grad:  2.0 mmHg    SHUNTS MV Vmax:       0.86 m/s    Systemic VTI:  0.15 m MV Vmean:      66.3 cm/s   Systemic Diam: 2.00 cm MV Decel Time: 131 msec MV E velocity: 68.70 cm/s MV A velocity: 83.30 cm/s MV E/A ratio:  0.82 Deatrice Cage MD Electronically signed by Deatrice Cage MD Signature Date/Time: 05/28/2024/3:06:45 PM    Final    DG Chest 2 View Result Date: 05/27/2024 EXAM: 2 VIEW(S) XRAY OF THE CHEST 05/27/2024 08:13:00 PM COMPARISON: 01/02/2024 CLINICAL HISTORY: CP FINDINGS: LUNGS AND PLEURA: No focal pulmonary opacity. No pleural effusion. No pneumothorax. HEART AND MEDIASTINUM: No acute abnormality of the cardiac and mediastinal silhouettes. BONES AND SOFT TISSUES: No acute  osseous abnormality. IMPRESSION: 1. No active cardiopulmonary disease. Electronically signed by: Franky Crease MD 05/27/2024 08:16 PM EST RP Workstation: [id number]    Disposition Plan & Communication  Patient status: Inpatient  Admitted From: Home Planned disposition location: Home Anticipated discharge date: 1/7 pending clinical course  Family Communication: none at bedside     Author: Marien LITTIE Piety, DO [facility] 05/29/2024, 8:22 AM   Available by Epic secure chat 7AM-7PM. If 7PM-7AM, please contact night-coverage.  [facility] contact information found on [email or web address].  "

## 2024-05-29 NOTE — ED Notes (Signed)
 This tech checked CBG and it was 35. RN made aware immediately.

## 2024-05-29 NOTE — Inpatient Diabetes Management (Signed)
 Inpatient Diabetes Program Recommendations  AACE/ADA: New Consensus Statement on Inpatient Glycemic Control  Target Ranges:  Prepandial:   less than 140 mg/dL      Peak postprandial:   less than 180 mg/dL (1-2 hours)      Critically ill patients:  140 - 180 mg/dL    Latest Reference Range & Units 05/28/24 14:14 05/28/24 15:38 05/28/24 16:45 05/28/24 18:56 05/28/24 19:30 05/28/24 21:55 05/29/24 01:23 05/29/24 05:05 05/29/24 07:53  Glucose-Capillary 70 - 99 mg/dL 870 (H) 36 (LL) 829 (H) 61 (L) 194 (H) 162 (H) 180 (H) 203 (H)  Novolog  2 units @6 :17 am 196 (H)    Latest Reference Range & Units 05/27/24 21:04 05/27/24 23:36 05/28/24 08:11 05/28/24 10:28 05/28/24 12:50 05/28/24 12:51  Glucose-Capillary 70 - 99 mg/dL 863 (H) 78 777 (H)  Novolog  5 units 134 (H) 38 (LL) 35 (LL)   Review of Glycemic Control  Diabetes history: DM1 Outpatient Diabetes medications: OmniPod insulin  pump with Novolog , Dexcom G7; off pump plan Lantus  11 units daily, Novolog   1 unit per 20 grams of carbs plus correction of 1:70 mg/dl Current orders for Inpatient glycemic control: Novolog  0-6 units Q4H   Inpatient Diabetes Program Recommendations:     Insulin : Only Novolog  given on 1/5 was Novolog  5 units at 8:37 am. Patient experienced hypoglycemia 3 times on 05/28/24. CBG 203 mg/l at 5:05 am today and patient received Novolog  2 units at 6:17 am today.  At this point, no insulin  changes recommended.  However, if CBGs become consistently over 180 mg/dl with Novolog  correction Q4H, please consider ordering insulin  glargine 5 units Q24H.   Outpatient DM: At time of discharge please provide Rx for Lantus  Solostar pens [phone]).   Thanks, Earnie Gainer, RN, MSN, CDCES Diabetes Coordinator Inpatient Diabetes Program [phone] (Team Pager from 8am to 5pm)

## 2024-05-29 NOTE — ED Notes (Signed)
 RN consulting Lenon, MD opinion for 1600 dose of insulin . Patient's CBG has maintained in the 190s and above since the 1 amp of D50 given for the CBG of 35@1156 . MD was asked if 1600 insulin  dose should be given and CBG monitored or hold at this time due to sensitivity. 1600 dose would require 3 units of insulin  (284CBG@1540 ). Patient only received 1 unit at 0800 for a 196CBG@0753  which resulted in the 35CBG@1156 .

## 2024-05-29 NOTE — ED Notes (Addendum)
 MD made aware of CBG: 35, 1 AMP of D50 given to patient. MD instructed RN to recheck CBG.

## 2024-05-30 ENCOUNTER — Other Ambulatory Visit: Payer: Self-pay

## 2024-05-30 DIAGNOSIS — R079 Chest pain, unspecified: Secondary | ICD-10-CM | POA: Diagnosis not present

## 2024-05-30 DIAGNOSIS — E1022 Type 1 diabetes mellitus with diabetic chronic kidney disease: Secondary | ICD-10-CM | POA: Diagnosis not present

## 2024-05-30 DIAGNOSIS — N179 Acute kidney failure, unspecified: Secondary | ICD-10-CM | POA: Diagnosis not present

## 2024-05-30 DIAGNOSIS — N189 Chronic kidney disease, unspecified: Secondary | ICD-10-CM | POA: Diagnosis not present

## 2024-05-30 DIAGNOSIS — E875 Hyperkalemia: Secondary | ICD-10-CM | POA: Diagnosis not present

## 2024-05-30 DIAGNOSIS — N1832 Chronic kidney disease, stage 3b: Secondary | ICD-10-CM

## 2024-05-30 DIAGNOSIS — J101 Influenza due to other identified influenza virus with other respiratory manifestations: Secondary | ICD-10-CM | POA: Diagnosis not present

## 2024-05-30 LAB — CBC
HCT: 23.7 % — ABNORMAL LOW (ref 36.0–46.0)
Hemoglobin: 8 g/dL — ABNORMAL LOW (ref 12.0–15.0)
MCH: 30.9 pg (ref 26.0–34.0)
MCHC: 33.8 g/dL (ref 30.0–36.0)
MCV: 91.5 fL (ref 80.0–100.0)
Platelets: 117 K/uL — ABNORMAL LOW (ref 150–400)
RBC: 2.59 MIL/uL — ABNORMAL LOW (ref 3.87–5.11)
RDW: 11.4 % — ABNORMAL LOW (ref 11.5–15.5)
WBC: 2.6 K/uL — ABNORMAL LOW (ref 4.0–10.5)
nRBC: 0 % (ref 0.0–0.2)

## 2024-05-30 LAB — BASIC METABOLIC PANEL WITH GFR
Anion gap: 13 (ref 5–15)
BUN: 25 mg/dL — ABNORMAL HIGH (ref 6–20)
CO2: 21 mmol/L — ABNORMAL LOW (ref 22–32)
Calcium: 8.2 mg/dL — ABNORMAL LOW (ref 8.9–10.3)
Chloride: 108 mmol/L (ref 98–111)
Creatinine, Ser: 2.05 mg/dL — ABNORMAL HIGH (ref 0.44–1.00)
GFR, Estimated: 32 mL/min — ABNORMAL LOW
Glucose, Bld: 123 mg/dL — ABNORMAL HIGH (ref 70–99)
Potassium: 4.6 mmol/L (ref 3.5–5.1)
Sodium: 142 mmol/L (ref 135–145)

## 2024-05-30 LAB — GLUCOSE, CAPILLARY
Glucose-Capillary: 124 mg/dL — ABNORMAL HIGH (ref 70–99)
Glucose-Capillary: 245 mg/dL — ABNORMAL HIGH (ref 70–99)

## 2024-05-30 MED ORDER — GUAIFENESIN ER 600 MG PO TB12
600.0000 mg | ORAL_TABLET | Freq: Two times a day (BID) | ORAL | 0 refills | Status: DC
  Filled 2024-05-30: qty 20, 10d supply, fill #0

## 2024-05-30 MED ORDER — OSELTAMIVIR PHOSPHATE 30 MG PO CAPS
30.0000 mg | ORAL_CAPSULE | Freq: Every day | ORAL | 0 refills | Status: DC
  Filled 2024-05-30: qty 3, 3d supply, fill #0

## 2024-05-30 NOTE — Progress Notes (Addendum)
 Heart Failure Stewardship Pharmacy Note  PCP: Dineen Channel, PA-C PCP-Cardiologist: Redell Cave, MD  HPI: Ashley Camacho is a 34 y.o. female with history of type 1 diabetes, CKD stage 3/diabetic nephropathy, diabetic gastroparesis, adrenal insufficiency, and CHF who presented with acute onset of chest pain. She described her pain as sharp, 6-7/10 in severity which has been intermittent and with associated nausea and diaphoresis without radiation.  She has been having associated cough and chest congestion with her intermittent chest pain over the last week.  On admission, HS-troponin was 25, K 6.3, Scr 2.3, BUN 37, eGFR 28, D-Dimer 0.53, and Influenza A positive. Chest x-ray 05/27/24 noted no active cardiopulmonary disease. TTE 05/28/24 noted LVEF 40-45% with global hypokinesis and grade I diastolic dysfunction. EKG showed sinus tachycardia with rate 118 with Q waves anteroseptally and T wave inversion inferiorly and laterally per MD note.   Pertinent cardiac history: Patient has history of nonischemic cardiomyopathy that was primarily followed at [facility] in the past.  There was concern that this could be a peri-partum cardiomyopathy.  Echo in 11/2020 after the birth of her second daughter showed EF 40-45%.  This had declined to 25% by 06/2021.  Cath in 06/2021 at [facility] showed no significant CAD.  However, echo in 10/2023 at [facility] was reviewed and noted to be relatively normal except for mild to moderate MR => EF 55-60%, GLS -23.4%, normal diastolic function, RV normal, mild-moderate MR, IVC normal. She was hospitalized at [facility] in 10/2023 for possible PNA. She was seen in the ER 12/22/23 with dyspnea and LE edema. She says the edema had been present since 10/2023 admission and she think she got too much IVF in the hospital. LE US  showed not DVT. CXR showed vascular congestion. BNP was severely elevated. Despite the unremarkable echo in 10/2023, she struggled with volume overload and was admitted in 12/2023 with CHF.   Cardiac MRI in 12/2023 showed moderately depressed LV EF 35% and RV EF 34%, no LGE/scarring noted, mitral regurgitation looked severe. This was a significant change from 10/2023 echo. TEE was done to reassess mitral regurgitation after diuresis, this showed EF 35-40%, only trivial mitral regurgitation and normal IVC.  Pertinent Lab Values: Creatinine  Date Value Ref Range Status  03/01/2013 0.76 0.60 - 1.30 mg/dL Final   Creatinine, Ser  Date Value Ref Range Status  05/30/2024 2.05 (H) 0.44 - 1.00 mg/dL Final   BUN  Date Value Ref Range Status  05/30/2024 25 (H) 6 - 20 mg/dL Final  92/69/7974 35 (H) 6 - 20 mg/dL Final  89/90/7985 13 7 - 18 mg/dL Final   Potassium  Date Value Ref Range Status  05/30/2024 4.6 3.5 - 5.1 mmol/L Final  03/01/2013 4.1 3.5 - 5.1 mmol/L Final   Sodium  Date Value Ref Range Status  05/30/2024 142 135 - 145 mmol/L Final  12/21/2023 145 (H) 134 - 144 mmol/L Final  03/01/2013 133 (L) 136 - 145 mmol/L Final   B Natriuretic Peptide  Date Value Ref Range Status  02/07/2024 933.9 (H) 0.0 - 100.0 pg/mL Final    Comment:    Performed at [facility], [address] [place], [place] [address]   Magnesium   Date Value Ref Range Status  03/26/2024 1.6 (L) 1.7 - 2.4 mg/dL Final    Comment:    Performed at [facility], [address] [place], [place] [address]   Hgb A1c MFr Bld  Date Value Ref Range Status  05/27/2024 7.1 (H) 4.8 -  5.6 % Final    Comment:    (NOTE) Diagnosis of Diabetes The following HbA1c ranges recommended by the American Diabetes Association (ADA) may be used as an aid in the diagnosis of diabetes mellitus.  Hemoglobin             Suggested A1C NGSP%              Diagnosis  <5.7                   Non Diabetic  5.7-6.4                Pre-Diabetic  >6.4                   Diabetic  <7.0                   Glycemic control for                       adults with diabetes.     TSH  Date Value Ref Range Status   03/26/2024 1.778 0.350 - 4.500 uIU/mL Final    Comment:    Performed by a 3rd Generation assay with a functional sensitivity of <=0.01 uIU/mL. Performed at [facility], [address] [place], [place] [address]   12/21/2023 4.330 0.450 - 4.500 uIU/mL Final   LDH  Date Value Ref Range Status  11/13/2022 189 98 - 192 U/L Final    Comment:    Performed at [facility], [address] [place], [place] [address]    Vital Signs: Temp:  [98.2 F (36.8 C)-99.9 F (37.7 C)] 98.3 F (36.8 C) (01/06 2350) Pulse Rate:  [75-107] 84 (01/06 2350) Cardiac Rhythm: Normal sinus rhythm (01/07 0800) Resp:  [12-24] 18 (01/06 2350) BP: (96-132)/(63-74) 119/74 (01/06 2350) SpO2:  [100 %] 100 % (01/06 2350) Weight:  [52.4 kg (115 lb 8.3 oz)] 52.4 kg (115 lb 8.3 oz) (01/07 0537) No intake or output data in the 24 hours ending 05/30/24 0811  Current Heart Failure Medications:  Loop diuretic: none Beta-Blocker: none ACEI/ARB/ARNI: none MRA: none SGLT2i: none Other: rosuvastatin  10 mg   Prior to admission Heart Failure Medications:  Loop diuretic: furosemide  20 mg daily PRN Beta-Blocker: metoprolol  succinate 25 mg daily (last fill 02/28/24 for #30, reports still taking) ACEI/ARB/ARNI: Entresto  24/26 mg BID MRA: none SGLT2i: none Other: isosorbide  mononitrate 15 mg daily, rosuvastatin  10 mg daily  Assessment: 1. Acute on chronic systolic and diastolic heart failure (LVEF 40-45%) with global hypokinesis and grade I diastolic dysfunction due to NICM.   MD suspects chest pain is likely non-cardiac. Reproducible to palpation. Suspect in relation to influenza infection and coughing. Mildly elevated troponins trended flat. Echo unremarkable. Has improved with supportive care. Cardiology has evaluated and ruled out ACS.  Visit conducted via telephone, unable to visually assess. Symptoms: Reports chest pain still present but has improved. Reports slight SOB from coughing. Reports  appetite is on and off due to flu. Denies swelling. Volume: No JVD, no LEE per physician note. Hemodynamics: BP 100-120s/60-70s, HR 70-100s Beta-Blocker: Can consider restarting home metoprolol  succinate 25 mg daily. Pt is euvolemic, BP and HR can tolerate.  ACEI/ARB/ARNI: Entresto  held due to hyperkalemia. Can consider restarting home Lokelma  and Entresto  at follow-up. K 6.3>4.6 MRA: Pt prescribed finerenone in 02/2024, states she did not know and did not pick it up. -Spironolactone  caused hyperkalemia in the past and contraindicated with creatinine >2  mg/dL. Even with potassium binders, would not restart.  SGLT2i: Contraindicated with type 1 diabetes.  Plan: 1) Medication changes recommended at this time: - None at this time. Patient discharging, plan to follow up outpatient.  2) Patient assistance: - Pending.  3) Education: - Patient has been educated on current HF medications and potential additions to HF medication regimen - Patient verbalizes understanding that over the next few months, these medication doses may change and more medications may be added to optimize HF regimen - Patient has been educated on basic disease state pathophysiology and goals of therapy  Calton Nash, [email or web address] PharmD Candidate  Please do not hesitate to reach out with questions or concerns,  Jaun Bash, PharmD, CPP, BCPS, [facility] Heart Failure Pharmacist  Phone - [phone] 05/30/2024 11:56 AM

## 2024-05-30 NOTE — Discharge Summary (Signed)
 " Physician Discharge Summary   Patient: Ashley Camacho MRN: [id number] DOB: [birth year]  Admit date:     05/27/2024  Discharge date: 05/30/2024  Discharge Physician: Ashley Camacho   PCP: Ashley Camacho   Recommendations at discharge:    Pt to be discharged home.   If you experience worsening fever, chills, chest pain, shortness of breath, or other concerning symptoms, please call your PCP or go to the emergency department immediately.  Discharge Diagnoses: Principal Problem:   Influenza A Active Problems:   Chest pain   Hyperkalemia   Acute kidney injury superimposed on chronic kidney disease   Hypothyroidism   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)   Chronic diastolic CHF (congestive heart failure) (HCC)   Pancreatic insufficiency   Dyslipidemia   Elevated troponin  Resolved Problems:   * No resolved hospital problems. *   Hospital Course:  34 y.o. female with a PMH significant for anxiety, depression, hypertension, type 1 diabetes mellitus and hypothyroidism and migraine. They presented with acute onset of chest pain. She has been having associated cough and chest congestion with her intermittent chest pain over the last week.    ED Course: VSS. hyperkalemia of 6.3 with a BUN of 37 and creatinine 2.3 compared to 37 and 1.94 on 04/09/24.  High sensitive troponin T was 25 and later 23.  CBC showed leukopenia of 3.3 and hemoglobin 10.4 hematocrit 31.4 above previous levels. EKG: sinus tachycardia with rate 118 with Q waves anteroseptally and T wave inversion inferiorly and laterally. Imaging: 2 view chest x-ray showed no acute cardiopulmonary disease.   The patient was given 10 g of p.o. Lokelma , IV D50/insulin , sodium bicarb ampule and 1 g of IV gluconate.   Assessment and Plan:  Atypical chest pain - Likely pleuritic pain, reproducible to palpation.  Likely secondary to influenza and coughing.  Echo unremarkable.  Resolved.  Acute influenza A -  Tamiflu  30 mg daily x 5 days, supportive care.  Showing improvement.  Adrenal insufficiency - Continue home Cortef .  Hyperkalemia - 6.3 on presentation.  Likely secondary to uncontrolled diabetes.  Now resolved after insulin , Lokelma .  Acute kidney injury on CKD 3B - Showing improvement back to baseline creatinine of 2.  Type 1 diabetes mellitus - Resume home    Consultants: none Procedures performed: none  Disposition: Home Diet recommendation:  Cardiac and Carb modified diet  DISCHARGE MEDICATION: Allergies as of 05/30/2024   No Known Allergies      Medication List     STOP taking these medications    Creon  24000-76000 units Cpep Generic drug: Pancrelipase  (Lip-Prot-Amyl)       TAKE these medications    Accu-Chek Guide Test test strip Generic drug: glucose blood Use to check blood sugar up to 6 times daily. May substitute to any manufacturer covered by patient's insurance.   Accu-Chek Guide w/Device Kit Use to check blood sugar up to 6 times daily. May substitute to any manufacturer covered by patient's insurance.   Accu-Chek Softclix Lancets lancets Use to check blood sugar up to 6 times daily. May substitute to any manufacturer covered by patient's insurance.   acetaminophen  500 MG tablet Commonly known as: TYLENOL  Take 2 tablets (1,000 mg total) by mouth every 6 (six) hours as needed for headache, fever or moderate pain.   Dexcom G7 Sensor Misc Use to check blood sugar. Change every 10 days   furosemide  20 MG tablet Commonly known as: LASIX  Take 1 tablet (20  mg total) by mouth daily as needed.   guaiFENesin  600 MG 12 hr tablet Commonly known as: MUCINEX  Take 1 tablet (600 mg total) by mouth 2 (two) times daily.   hydrocortisone  10 MG tablet Commonly known as: CORTEF  Take 2 tablets (20 mg total) by mouth daily with breakfast AND 1 tablet (10 mg total) daily with supper. What changed: See the new instructions.   insulin  aspart 100 UNIT/ML  FlexPen Commonly known as: NOVOLOG  Inject 2 Units into the skin 3 (three) times daily with meals. If eating and Blood Glucose (BG) 80 or higher inject 2 units for meal coverage and add correction dose per scale. If not eating, correction dose only. BG <150= 0 unit; BG 150-200= 1 unit; BG 201-250= 3 unit; BG 251-300= 5 unit; BG 301-350= 7 unit; BG 351-400= 9 unit; BG >400= 11 unit and Call Primary Care.   NovoLOG  100 UNIT/ML injection Generic drug: insulin  aspart Inject 0-50 Units into the skin as needed for high blood sugar. FOR USE IN INSULIN  PUMP. MAX 50 UNITS PER DAY.   Lancet Device Misc Use to check blood sugar up to 6 times daily. May substitute to any manufacturer covered by patient's insurance.   levothyroxine  75 MCG tablet Commonly known as: SYNTHROID  TAKE 1 TABLET (75 MCG TOTAL) BY MOUTH DAILY AT 6 (SIX) AM.   Lokelma  10 g Pack packet Generic drug: sodium zirconium cyclosilicate  Take 10 g by mouth daily.   metoprolol  succinate 25 MG 24 hr tablet Commonly known as: TOPROL -XL TAKE 1 TABLET (25 MG TOTAL) BY MOUTH DAILY.   nortriptyline 25 MG capsule Commonly known as: PAMELOR Take 25 mg by mouth at bedtime. What changed:  how much to take additional instructions   oseltamivir  30 MG capsule Commonly known as: TAMIFLU  Take 1 capsule (30 mg total) by mouth daily. Start taking on: May 31, 2024   Pen Needles 31G X 6 MM Misc PEN NEEDLES 31G X 6 MM   BD Pen Needle Mini Ultrafine 31G X 5 MM Misc Generic drug: Insulin  Pen Needle by Other route.   rosuvastatin  10 MG tablet Commonly known as: CRESTOR  Take 1 tablet (10 mg total) by mouth daily.   sacubitril -valsartan  24-26 MG Commonly known as: Entresto  Take 1 tablet by mouth 2 (two) times daily.        Follow-up Information     [facility]. Go on 06/07/2024.   Specialty: Cardiology Why: Hospital Follow-Up 06/07/24 @ 3:30  Please bring all medications to follow-up  appointment [facility], [address] Second Floor Free Valet Parking at the door Contact information: [address] [place]  [address] [phone]                Discharge Exam: Ashley Camacho   05/27/24 1946 05/30/24 0537  Weight: 53.5 kg 52.4 kg    GENERAL:  Alert, pleasant, no acute distress  HEENT:  EOMI CARDIOVASCULAR:  RRR, no murmurs appreciated RESPIRATORY:  Clear to auscultation, no wheezing, rales, or rhonchi GASTROINTESTINAL:  Soft, nontender, nondistended EXTREMITIES:  No LE edema bilaterally NEURO:  No new focal deficits appreciated SKIN:  No rashes noted PSYCH:  Appropriate mood and affect     Condition at discharge: improving  The results of significant diagnostics from this hospitalization (including imaging, microbiology, ancillary and laboratory) are listed below for reference.   Imaging Studies: ECHOCARDIOGRAM COMPLETE Result Date: 05/28/2024    ECHOCARDIOGRAM REPORT   Patient Name:   BERTA DENSON  Halling Date of Exam: 05/28/2024 Medical Rec #:  [id number]       Height:       61.0 in Accession #:    [id number]      Weight:       118.0 lb Date of Birth:  May 04, 1991      BSA:          1.509 m Patient Age:    33 years        BP:           73/47 mmHg Patient Gender: F               HR:           76 bpm. Exam Location:  [facility] Procedure: 2D Echo, Cardiac Doppler, Color Doppler, Strain Analysis and 3D Echo            (Both Spectral and Color Flow Doppler were utilized during            procedure). 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 CHF; Risk Factors:Hypertension.  Sonographer:     Christopher Furnace Referring Phys:  RYAN M DUNN Diagnosing Phys: Deatrice Cage MD IMPRESSIONS  1. Left ventricular ejection fraction, by estimation, is 40 to 45%. The left ventricle has mildly decreased function. The left ventricle  demonstrates global hypokinesis. Left ventricular diastolic parameters are consistent with Grade I diastolic dysfunction (impaired relaxation).  2. Right ventricular systolic function is normal. The right ventricular size is normal. Tricuspid regurgitation signal is inadequate for assessing PA pressure.  3. The mitral valve is normal in structure. No evidence of mitral valve regurgitation. No evidence of mitral stenosis.  4. The aortic valve is normal in structure. Aortic valve regurgitation is not visualized. No aortic stenosis is present.  5. The inferior vena cava is normal in size with greater than 50% respiratory variability, suggesting right atrial pressure of 3 mmHg. FINDINGS  Left Ventricle: Left ventricular ejection fraction, by estimation, is 40 to 45%. The left ventricle has mildly decreased function. The left ventricle demonstrates global hypokinesis. Global longitudinal strain performed but not reported based on interpreter judgement due to suboptimal tracking. The left ventricular internal cavity size was normal in size. There is no left ventricular hypertrophy. Left ventricular diastolic parameters are consistent with Grade I diastolic dysfunction (impaired relaxation). Right Ventricle: The right ventricular size is normal. No increase in right ventricular wall thickness. Right ventricular systolic function is normal. Tricuspid regurgitation signal is inadequate for assessing PA pressure. The tricuspid regurgitant velocity is 1.46 m/s, and with an assumed right atrial pressure of 3 mmHg, the estimated right ventricular systolic pressure is 11.5 mmHg. Left Atrium: Left atrial size was normal in size. Right Atrium: Right atrial size was normal in size. Pericardium: There is no evidence of pericardial effusion. Mitral Valve: The mitral valve is normal in structure. No evidence of mitral valve regurgitation. No evidence of mitral valve stenosis. MV peak gradient, 3.0 mmHg. The mean mitral valve gradient  is 2.0 mmHg. Tricuspid Valve: The tricuspid valve is normal in structure. Tricuspid valve regurgitation is not demonstrated. No evidence of tricuspid stenosis. Aortic Valve: The aortic valve is normal in structure. Aortic valve regurgitation is not visualized. No aortic stenosis is present. Aortic valve mean gradient measures 3.0 mmHg.  Aortic valve peak gradient measures 5.5 mmHg. Aortic valve area, by VTI measures 2.85 cm. Pulmonic Valve: The pulmonic valve was normal in structure. Pulmonic valve regurgitation is not visualized. No evidence of pulmonic stenosis. Aorta: The aortic root is normal in size and structure. Venous: The inferior vena cava is normal in size with greater than 50% respiratory variability, suggesting right atrial pressure of 3 mmHg. IAS/Shunts: No atrial level shunt detected by color flow Doppler.  LEFT VENTRICLE PLAX 2D LVIDd:         3.80 cm   Diastology LVIDs:         2.80 cm   LV e' medial:    10.90 cm/s LV PW:         0.90 cm   LV E/e' medial:  6.3 LV IVS:        0.80 cm   LV e' lateral:   11.70 cm/s LVOT diam:     2.00 cm   LV E/e' lateral: 5.9 LV SV:         48 LV SV Index:   32 LVOT Area:     3.14 cm LV IVRT:       124 msec                          3D Volume EF:                          3D EF:        38 % RIGHT VENTRICLE RV Basal diam:  2.80 cm RV Mid diam:    2.80 cm LEFT ATRIUM             Index        RIGHT ATRIUM           Index LA diam:        2.20 cm 1.46 cm/m   RA Area:     10.10 cm LA Vol (A2C):   20.0 ml 13.25 ml/m  RA Volume:   20.90 ml  13.85 ml/m LA Vol (A4C):   19.8 ml 13.12 ml/m LA Biplane Vol: 20.7 ml 13.71 ml/m  AORTIC VALVE AV Area (Vmax):    2.57 cm AV Area (Vmean):   2.82 cm AV Area (VTI):     2.85 cm AV Vmax:           117.00 cm/s AV Vmean:          75.100 cm/s AV VTI:            0.170 m AV Peak Grad:      5.5 mmHg AV Mean Grad:      3.0 mmHg LVOT Vmax:         95.80 cm/s LVOT Vmean:        67.400 cm/s LVOT VTI:          0.154 m LVOT/AV VTI ratio: 0.91   AORTA Ao Root diam: 2.60 cm MITRAL VALVE               TRICUSPID VALVE MV Area (PHT): 5.79 cm    TR Peak grad:   8.5 mmHg MV Area VTI:   2.91 cm    TR Vmax:        146.00 cm/s MV Peak grad:  3.0 mmHg MV Mean grad:  2.0 mmHg    SHUNTS MV Vmax:       0.86 m/s    Systemic VTI:  0.15 m MV Vmean:      66.3 cm/s   Systemic Diam: 2.00 cm MV Decel Time: 131 msec MV E velocity: 68.70 cm/s MV A velocity: 83.30 cm/s MV E/A ratio:  0.82 Deatrice Cage MD Electronically signed by Deatrice Cage MD Signature Date/Time: 05/28/2024/3:06:45 PM    Final    DG Chest 2 View Result Date: 05/27/2024 EXAM: 2 VIEW(S) XRAY OF THE CHEST 05/27/2024 08:13:00 PM COMPARISON: 01/02/2024 CLINICAL HISTORY: CP FINDINGS: LUNGS AND PLEURA: No focal pulmonary opacity. No pleural effusion. No pneumothorax. HEART AND MEDIASTINUM: No acute abnormality of the cardiac and mediastinal silhouettes. BONES AND SOFT TISSUES: No acute osseous abnormality. IMPRESSION: 1. No active cardiopulmonary disease. Electronically signed by: Franky Crease MD 05/27/2024 08:16 PM EST RP Workstation: [id number]    Microbiology: Results for orders placed or performed during the hospital encounter of 05/27/24  Resp panel by RT-PCR (RSV, Flu A&B, Covid) Anterior Nasal Swab     Status: Abnormal   Collection Time: 05/27/24  9:19 PM   Specimen: Anterior Nasal Swab  Result Value Ref Range Status   SARS Coronavirus 2 by RT PCR NEGATIVE NEGATIVE Final    Comment: (NOTE) SARS-CoV-2 target nucleic acids are NOT DETECTED.  The SARS-CoV-2 RNA is generally detectable in upper respiratory specimens during the acute phase of infection. The lowest concentration of SARS-CoV-2 viral copies this assay can detect is 138 copies/mL. A negative result does not preclude SARS-Cov-2 infection and should not be used as the sole basis for treatment or other patient management decisions. A negative result may occur with  improper specimen collection/handling, submission of specimen  other than nasopharyngeal swab, presence of viral mutation(s) within the areas targeted by this assay, and inadequate number of viral copies(<138 copies/mL). A negative result must be combined with clinical observations, patient history, and epidemiological information. The expected result is Negative.  Fact Sheet for Patients:  bloggercourse.com  Fact Sheet for Healthcare Providers:  seriousbroker.it  This test is no t yet approved or cleared by the [place]  FDA and  has been authorized for detection and/or diagnosis of SARS-CoV-2 by FDA under an Emergency Use Authorization (EUA). This EUA will remain  in effect (meaning this test can be used) for the duration of the COVID-19 declaration under Section 564(b)(1) of the Act, 21 U.S.C.section 360bbb-3(b)(1), unless the authorization is terminated  or revoked sooner.       Influenza A by PCR POSITIVE (A) NEGATIVE Final   Influenza B by PCR NEGATIVE NEGATIVE Final    Comment: (NOTE) The Xpert Xpress SARS-CoV-2/FLU/RSV plus assay is intended as an aid in the diagnosis of influenza from Nasopharyngeal swab specimens and should not be used as a sole basis for treatment. Nasal washings and aspirates are unacceptable for Xpert Xpress SARS-CoV-2/FLU/RSV testing.  Fact Sheet for Patients: bloggercourse.com  Fact Sheet for Healthcare Providers: seriousbroker.it  This test is not yet approved or cleared by the [place]  FDA and has been authorized for detection and/or diagnosis of SARS-CoV-2 by FDA under an Emergency Use Authorization (EUA). This EUA will remain in effect (meaning this test can be used) for the duration of the COVID-19 declaration under Section 564(b)(1) of the Act, 21 U.S.C. section 360bbb-3(b)(1), unless the authorization is terminated or revoked.     Resp Syncytial Virus by PCR NEGATIVE NEGATIVE Final     Comment: (NOTE) Fact Sheet for Patients: bloggercourse.com  Fact Sheet for Healthcare Providers: seriousbroker.it  This test is not yet approved or cleared by the [place]  FDA  and has been authorized for detection and/or diagnosis of SARS-CoV-2 by FDA under an Emergency Use Authorization (EUA). This EUA will remain in effect (meaning this test can be used) for the duration of the COVID-19 declaration under Section 564(b)(1) of the Act, 21 U.S.C. section 360bbb-3(b)(1), unless the authorization is terminated or revoked.  Performed at [facility], [address] [place], [place] [address]     Labs: CBC: Recent Labs  Lab 05/27/24 1958 05/28/24 0436 05/29/24 0947 05/30/24 0444  WBC 3.3* 2.4* 2.6* 2.6*  HGB 10.4* 8.1* 7.7* 8.0*  HCT 31.4* 24.8* 23.2* 23.7*  MCV 92.9 93.2 92.4 91.5  PLT 157 125* 109* 117*   Basic Metabolic Panel: Recent Labs  Lab 05/27/24 1958 05/28/24 0436 05/29/24 0456 05/30/24 0444  NA 138 139 138 142  K 6.3* 5.6* 5.1 4.6  CL 101 105 106 108  CO2 24 28 20* 21*  GLUCOSE 142* 190* 221* 123*  BUN 37* 33* 26* 25*  CREATININE 2.30* 2.59* 2.44* 2.05*  CALCIUM  9.1 8.5* 7.3* 8.2*   Liver Function Tests: No results for input(s): AST, ALT, ALKPHOS, BILITOT, PROT, ALBUMIN  in the last 168 hours. CBG: Recent Labs  Lab 05/29/24 1540 05/29/24 2030 05/29/24 2358 05/30/24 0436 05/30/24 0733  GLUCAP 284* 313* 171* 124* 245*    Discharge time spent: 25 minutes.  Length of inpatient stay: 2 days  Signed: Carliss LELON Canales, DO [facility] 05/30/2024         "

## 2024-06-06 ENCOUNTER — Telehealth: Payer: Self-pay | Admitting: Family

## 2024-06-06 NOTE — Telephone Encounter (Signed)
 Called to confirm/remind patient of their appointment [facility] on 06/07/24.   Appointment:   [] Confirmed  [x] Left mess   [] No answer/No voice mail  [] VM Full/unable to leave message  [] Phone not in service  Patient reminded to bring all medications and/or complete list.  Confirmed patient has transportation. Gave directions, instructed to utilize valet parking.

## 2024-06-06 NOTE — Progress Notes (Unsigned)
 "  Advanced Heart Failure Clinic Note    PCP: Dineen Channel, PA-C Cardiologist: Redell Cave, MD  HF Cardiology: Dr Rolan  Chief Complaint:    HPI:  Ashley Camacho is a 34 y.o. female with a history of type 1 diabetes, CKD stage 3/diabetic nephropathy, diabetic gastroparesis, adrenal insufficiency, and CHF was referred by Dr. Claudene in the [facility] ER for evaluation of CHF.  She has a rather complex history.  Echo in 7/22 after the birth of her second daughter showed EF 40-45%.  This had declined to 25% by 2/23, ?peripartum CMP.  Cath in 2/23 at [facility] showed no significant CAD.  She had AKI in 3/23 requiring transient dialysis, possible acute interstitial nephritis.  She also had Enterococcal bacteremia that admission in 3/23. She has numerous diabetic complications including gastroparesis, nephropathy, and neuropathy.    She was hospitalized at [facility] in 6/25 for possible PNA.  She was seen in the ER 12/22/23 with dyspnea and LE edema. She says the edema had been present since 6/25 admission and she think she got too much IVF in the hospital. LE US  showed not DVT.  CXR showed vascular congestion. BNP was severely elevated.  Interestingly, patient had had an echo in 6/25 at [facility] that I reviewed; this was fairly normal with EF 55-60%, GLS -23.4%, normal diastolic function, RV normal, mild-moderate MR, IVC normal. Echo did not match clinical picture.  Patient was started on Lasix  20 mg daily when she was sent home from the ER.   Patient was admitted in 8/25 with CHF. She was diuresed with Lasix  gtt, developed AKI with creatinine up to 3.15. Cardiac MRI showed mild LV dilation with LV EF 35%, RV EF 34%, no delayed enhancement, and possible severe MR with regurgitant fraction 45%. TEE was done to reassess mitral regurgitation after diuresis, this showed EF 35-40%, only trivial mitral regurgitation and normal IVC.   Admitted 02/09/24 with hyperkalemia of 6.1. Initially had peaked T waves which resolved after  receiving 3 doses of lokelma . Valsartan , spironolactone  were stopped. Imdur  / hydralazine  held due to BP.    Admitted 03/26/24 with lightheadedness, dizziness and hypoglycemia at home. Patient was in her usual state of health until night last night when she took some THC Gummies. She was seen in the emergency department last night and was discharged home after symptoms improved. This morning she ate breakfast but continued to have symptoms which prompted her emergency department visit. Hypoglycemic at 52 on presentation, which further decreased to 35 after initial improvement with D50 so she was started on D5, also received glucagon  and Decadron . Entresto  held and lokelma  given. Instructed to turn off insulin  pump and adjust with insulin  until seen by endocrinology. Entresto  resumed at discharge.   Admitted 05/27/24 with acute chest pain, cough, chest congestion. In the ER, hyperkalemia of 6.3 with a BUN of 37 and creatinine 2.3.  High sensitive troponin T was 25 and later 23.  CBC showed leukopenia of 3.3 and hemoglobin 10.4 hematocrit 31.4 above previous levels. ST with rate 118 with Q waves anteroseptally and T wave inversion inferiorly and laterally.  2 view chest x-ray showed no acute cardiopulmonary disease. Given 10 g of p.o. Lokelma , IV D50/insulin , sodium bicarb ampule and 1 g of IV gluconate. Diagnosed with influenza and treated with tamiflu . CP likely pleuritic due to influenza. Echo 05/28/24: EF 40-45%, G1DD, normal RV. K+ level at discharge was  normal at 4.6. Hyperkalemia thought to be due to uncontrolled DM.   She presents  today for a post hospital visit with a chief complaint of    ECG : not done  Labs (7/25): HS-TnI 19 => 20, BNP 4236, K 5.2, creatinine 1.82 Labs (8/25): Creatinine 3.1, BNP >4500 Labs (9/25): K 5.3, creatinine 1.86 Labs (9/25): K 6.1 => 5.2, Glucose 598 => 121, creatinine 2.52 => 2.37 Labs (10/25): K 4.8, creatinine 2.01 Labs (11/25): 4.4=> 5.8, creatinine 2.01=> 2.13, Hg  10.6=> 8.1, TSH 1.778  PMH: 1. Type 1 diabetes with h/o DKA.  2. Diabetic gastroparesis: Severe  3. Adrenal insufficiency: She is on hydrocortisone  4. CKD stage 3: Diabetic nephropathy.  5. Chronic HF with recovered EF: H/o nonischemic cardiomyopathy.  - Echo (7/22): EF 40-45% => ?peri-partum cardiomyopathy.  - Echo (2/23): EF 25% - LHC (2/23, [facility]): no significant CAD.  - Echo (3/23): EF 20% - Echo (10/23): EF 30-35% ([facility]) - Echo (6/25): EF 55-60%, GLS -23.4%, normal diastolic function, RV normal, mild-moderate MR, IVC normal - Cardiac MRI (8/25): mild LV dilation with LV EF 35%, RV EF 34%, no delayed enhancement, and possible severe MR with regurgitant fraction 45%. - TEE (8/25): EF 35-40%, only trivial mitral regurgitation and normal IVC.  - Echo (01/26): EF 40-45%, normal RV 6. Diabetic neuropathy.  7. PEA arrest after EGD in 3/23.  8. AKI in 3/23 from possible acute interstitial nephritis, required transient HD.  9. Enterococcal bacteremia in 3/23.  10. RUE DVT in 2022.   SH: Lives with roommate in [place], 2 daughters, nonsmoker, no ETOH.  Not working.   FH: Father with type 1 diabetes, CAD with stent around age 68, died with cardiac arrest around age 56. Sister with SVT.   ROS: All systems reviewed and negative except as per HPI.    Past Medical History:  Diagnosis Date   Acute metabolic encephalopathy 06/10/2022   Adrenal insufficiency    Anemia    Anxiety    Blood transfusion without reported diagnosis    CHF (congestive heart failure) (HCC)    Chronic kidney disease    Depression    Gastroparesis    Hypertension    Hypotension    Intractable nausea and vomiting 11/13/2022   Migraine    Thyroid  disease    Type 1 diabetes (HCC)     Current Outpatient Medications  Medication Sig Dispense Refill   Accu-Chek Softclix Lancets lancets Use to check blood sugar up to 6 times daily. May substitute to any manufacturer covered by patient's insurance. 100 each 5    acetaminophen  (TYLENOL ) 500 MG tablet Take 2 tablets (1,000 mg total) by mouth every 6 (six) hours as needed for headache, fever or moderate pain. 30 tablet 3   BD PEN NEEDLE MINI ULTRAFINE 31G X 5 MM MISC by Other route.     Blood Glucose Monitoring Suppl (BLOOD GLUCOSE MONITOR SYSTEM) w/Device KIT Use to check blood sugar up to 6 times daily. May substitute to any manufacturer covered by patient's insurance. 1 kit 0   Continuous Glucose Sensor (DEXCOM G7 SENSOR) MISC Use to check blood sugar. Change every 10 days 3 each 3   furosemide  (LASIX ) 20 MG tablet Take 1 tablet (20 mg total) by mouth daily as needed. 30 tablet 3   Glucose Blood (BLOOD GLUCOSE TEST STRIPS) STRP Use to check blood sugar up to 6 times daily. May substitute to any manufacturer covered by patient's insurance. 100 strip 5   guaiFENesin  (MUCINEX ) 600 MG 12 hr tablet Take 1 tablet (600 mg total) by mouth 2 (two) times  daily. 20 tablet 0   hydrocortisone  (CORTEF ) 10 MG tablet Take 2 tablets (20 mg total) by mouth daily with breakfast AND 1 tablet (10 mg total) daily with supper. (Patient taking differently: Take 15mg  by mouth daily with breakfast AND 1 tablet (5 mg total) daily with supper.) 90 tablet 0   insulin  aspart (NOVOLOG ) 100 UNIT/ML FlexPen Inject 2 Units into the skin 3 (three) times daily with meals. If eating and Blood Glucose (BG) 80 or higher inject 2 units for meal coverage and add correction dose per scale. If not eating, correction dose only. BG <150= 0 unit; BG 150-200= 1 unit; BG 201-250= 3 unit; BG 251-300= 5 unit; BG 301-350= 7 unit; BG 351-400= 9 unit; BG >400= 11 unit and Call Primary Care.     Insulin  Pen Needle (PEN NEEDLES) 31G X 6 MM MISC PEN NEEDLES 31G X 6 MM     Lancet Device MISC Use to check blood sugar up to 6 times daily. May substitute to any manufacturer covered by patient's insurance. 1 each 1   levothyroxine  (SYNTHROID ) 75 MCG tablet TAKE 1 TABLET (75 MCG TOTAL) BY MOUTH DAILY AT 6 (SIX) AM. 90  tablet 0   metoprolol  succinate (TOPROL -XL) 25 MG 24 hr tablet TAKE 1 TABLET (25 MG TOTAL) BY MOUTH DAILY. 90 tablet 1   nortriptyline (PAMELOR) 25 MG capsule Take 25 mg by mouth at bedtime. (Patient taking differently: Take 15 mg by mouth at bedtime. Pt taking 15mg  for 30 days after she will resume 25mg )     NOVOLOG  100 UNIT/ML injection Inject 0-50 Units into the skin as needed for high blood sugar. FOR USE IN INSULIN  PUMP. MAX 50 UNITS PER DAY.     oseltamivir  (TAMIFLU ) 30 MG capsule Take 1 capsule (30 mg total) by mouth daily. 3 capsule 0   rosuvastatin  (CRESTOR ) 10 MG tablet Take 1 tablet (10 mg total) by mouth daily. 30 tablet 11   sacubitril -valsartan  (ENTRESTO ) 24-26 MG Take 1 tablet by mouth 2 (two) times daily. 60 tablet 2   sodium zirconium cyclosilicate  (LOKELMA ) 10 g PACK packet Take 10 g by mouth daily. 30 packet 3   No current facility-administered medications for this visit.    No Known Allergies    Social History   Socioeconomic History   Marital status: Single    Spouse name: Not on file   Number of children: Not on file   Years of education: Not on file   Highest education level: 9th grade  Occupational History   Occupation: home maker  Tobacco Use   Smoking status: Never    Passive exposure: Never   Smokeless tobacco: Never  Vaping Use   Vaping status: Never Used  Substance and Sexual Activity   Alcohol use: No   Drug use: No   Sexual activity: Not Currently    Birth control/protection: None  Other Topics Concern   Not on file  Social History Narrative   Not on file   Social Drivers of Health   Tobacco Use: Low Risk (05/27/2024)   Patient History    Smoking Tobacco Use: Never    Smokeless Tobacco Use: Never    Passive Exposure: Never  Financial Resource Strain: Medium Risk (04/11/2024)   Overall Financial Resource Strain (CARDIA)    Difficulty of Paying Living Expenses: Somewhat hard  Food Insecurity: Food Insecurity Present (05/30/2024)   Epic     Worried About Radiation Protection Practitioner of Food in the Last Year: Sometimes true  Ran Out of Food in the Last Year: Sometimes true  Transportation Needs: No Transportation Needs (05/30/2024)   Epic    Lack of Transportation (Medical): No    Lack of Transportation (Non-Medical): No  Physical Activity: Insufficiently Active (04/11/2024)   Exercise Vital Sign    Days of Exercise per Week: 3 days    Minutes of Exercise per Session: 20 min  Stress: Stress Concern Present (04/11/2024)   [employer] of Occupational Health - Occupational Stress Questionnaire    Feeling of Stress: Rather much  Social Connections: Socially Isolated (04/11/2024)   Social Connection and Isolation Panel    Frequency of Communication with Friends and Family: More than three times a week    Frequency of Social Gatherings with Friends and Family: More than three times a week    Attends Religious Services: Never    Database Administrator or Organizations: No    Attends Engineer, Structural: Not on file    Marital Status: Never married  Intimate Partner Violence: Not At Risk (05/30/2024)   Epic    Fear of Current or Ex-Partner: No    Emotionally Abused: No    Physically Abused: No    Sexually Abused: No  Depression (PHQ2-9): High Risk (01/18/2024)   Depression (PHQ2-9)    PHQ-2 Score: 14  Alcohol Screen: Low Risk (04/11/2024)   Alcohol Screen    Last Alcohol Screening Score (AUDIT): 1  Housing: High Risk (05/30/2024)   Epic    Unable to Pay for Housing in the Last Year: Yes    Number of Times Moved in the Last Year: 1    Homeless in the Last Year: No  Utilities: Not At Risk (05/30/2024)   Epic    Threatened with loss of utilities: No  Health Literacy: Adequate Health Literacy (12/21/2023)   B1300 Health Literacy    Frequency of need for help with medical instructions: Never      Family History  Problem Relation Age of Onset   Breast cancer Mother    Seizures Mother    Cancer Mother    Diabetes type I  Father    CAD Father    Diabetes Father    Early death Father    Lung cancer Maternal Grandfather    CAD Paternal Grandmother    Diabetes Paternal Grandmother    CAD Paternal Grandfather    Diabetes Paternal Grandfather    ADD / ADHD Daughter    Diabetes Maternal Uncle    Ovarian cancer Neg Hx    There were no vitals filed for this visit.  Wt Readings from Last 3 Encounters:  05/30/24 115 lb 8.3 oz (52.4 kg)  04/11/24 118 lb 9.6 oz (53.8 kg)  04/09/24 120 lb (54.4 kg)   Lab Results  Component Value Date   CREATININE 2.05 (H) 05/30/2024   CREATININE 2.44 (H) 05/29/2024   CREATININE 2.59 (H) 05/28/2024    PHYSICAL EXAM:  General: Well appearing, thin female.   Cor: No JVD. Regular rhythm, rate.  Lungs: clear Abdomen: soft, nontender, nondistended. Extremities: no edema Neuro:. Affect pleasant   ECG: not done   ASSESSMENT & PLAN:   1. Chronic systolic CHF: Patient has history of nonischemic cardiomyopathy that was primarily followed at [facility] in the past.  There was concern that this could be a peri-partum cardiomyopathy.  Echo in 7/22 after the birth of her second daughter showed EF 40-45%.  This had declined to 25% by 2/23, ?peripartum CMP.  Cath in  2/23 at [facility] showed no significant CAD.  However, echo in 6/25 at [facility] was reviewed and noted to be relatively normal except for mild to moderate MR => EF 55-60%, GLS -23.4%, normal diastolic function, RV normal, mild-moderate MR, IVC normal.  Despite the unremarkable echo in 6/25, she struggled with volume overload and was admitted in 8/25 with CHF.  Cardiac MRI in 8/25 showed moderately depressed LV EF 35% and RV EF 34%, no LGE/scarring noted, mitral regurgitation looked severe.  This was a significant change from 6/25 echo.  I think coronary disease is unlikely with no LGE on cMRI and normal cath in 2023.  Would avoid coronary angiography with elevated creatinine.  Possible diabetic cardiomyopathy. TEE was done after diuresis to  assess MR, this showed EF 35-40%, mild RV dysfunction, and only trivial mitral regurgitation. She developed AKI with diuresis during 8/25 admission. NYHA class II currently, euvolemic  BP stable.  - Continue Lasix  20 mg daily PRN. Has not had to take this in quite awhile.  - Continue Toprol  XL 25 mg daily.  - Continue Entresto  24/26mg  BID - most recent K+ elevated at 5.8. She says that she consistently takes lokelma  2-3 times/ week because she has a hard time remembering to separate it and her other medications by 2 hours. BMET today - She is not a candidate for SGLT2 inhibitor with type 1 diabetes.  2. Hyperlipidemia: continue crestor  10 mg daily.  3. CKD stage 3: Diabetic nephropathy.   - BMET today 4. Type 1 diabetes: Poor control.   - Follows with endocrinology. Emphasized to patient and that she call them today to let them know that she's getting glucose readings in 40's-50's 5. HTN: BP 112/72   Return in 2 months, sooner if needed.   I spent 30 minutes reviewing records, interviewing/ examing patient and managing plan/ orders.   Ellouise DELENA Class, FNP 06/06/2024  "

## 2024-06-07 ENCOUNTER — Ambulatory Visit: Admitting: Family

## 2024-06-11 ENCOUNTER — Ambulatory Visit: Admitting: Physician Assistant

## 2024-06-11 ENCOUNTER — Encounter: Payer: Self-pay | Admitting: Physician Assistant

## 2024-06-12 ENCOUNTER — Ambulatory Visit: Admitting: Physician Assistant

## 2024-06-12 DIAGNOSIS — N1832 Chronic kidney disease, stage 3b: Secondary | ICD-10-CM

## 2024-06-12 DIAGNOSIS — E2740 Unspecified adrenocortical insufficiency: Secondary | ICD-10-CM

## 2024-06-12 DIAGNOSIS — E109 Type 1 diabetes mellitus without complications: Secondary | ICD-10-CM

## 2024-06-12 DIAGNOSIS — E039 Hypothyroidism, unspecified: Secondary | ICD-10-CM

## 2024-06-12 DIAGNOSIS — E875 Hyperkalemia: Secondary | ICD-10-CM

## 2024-06-12 DIAGNOSIS — R0789 Other chest pain: Secondary | ICD-10-CM

## 2024-06-12 DIAGNOSIS — Z09 Encounter for follow-up examination after completed treatment for conditions other than malignant neoplasm: Secondary | ICD-10-CM

## 2024-06-12 DIAGNOSIS — F339 Major depressive disorder, recurrent, unspecified: Secondary | ICD-10-CM

## 2024-06-12 DIAGNOSIS — I5020 Unspecified systolic (congestive) heart failure: Secondary | ICD-10-CM

## 2024-06-12 DIAGNOSIS — Z794 Long term (current) use of insulin: Secondary | ICD-10-CM

## 2024-06-12 DIAGNOSIS — F419 Anxiety disorder, unspecified: Secondary | ICD-10-CM

## 2024-06-12 DIAGNOSIS — J101 Influenza due to other identified influenza virus with other respiratory manifestations: Secondary | ICD-10-CM

## 2024-06-13 ENCOUNTER — Inpatient Hospital Stay: Attending: Oncology

## 2024-06-13 ENCOUNTER — Encounter: Payer: Self-pay | Admitting: Oncology

## 2024-06-13 ENCOUNTER — Inpatient Hospital Stay: Admitting: Oncology

## 2024-06-13 ENCOUNTER — Ambulatory Visit: Payer: Self-pay | Admitting: Oncology

## 2024-06-13 VITALS — BP 88/58 | HR 94 | Temp 97.6°F | Resp 14 | Wt 107.9 lb

## 2024-06-13 DIAGNOSIS — D696 Thrombocytopenia, unspecified: Secondary | ICD-10-CM

## 2024-06-13 DIAGNOSIS — K3184 Gastroparesis: Secondary | ICD-10-CM

## 2024-06-13 DIAGNOSIS — E60 Dietary zinc deficiency: Secondary | ICD-10-CM | POA: Diagnosis not present

## 2024-06-13 DIAGNOSIS — D72819 Decreased white blood cell count, unspecified: Secondary | ICD-10-CM

## 2024-06-13 DIAGNOSIS — N183 Chronic kidney disease, stage 3 unspecified: Secondary | ICD-10-CM

## 2024-06-13 DIAGNOSIS — D631 Anemia in chronic kidney disease: Secondary | ICD-10-CM

## 2024-06-13 DIAGNOSIS — E861 Hypovolemia: Secondary | ICD-10-CM | POA: Diagnosis not present

## 2024-06-13 DIAGNOSIS — N1832 Chronic kidney disease, stage 3b: Secondary | ICD-10-CM | POA: Insufficient documentation

## 2024-06-13 LAB — CBC WITH DIFFERENTIAL/PLATELET
Abs Immature Granulocytes: 0.02 K/uL (ref 0.00–0.07)
Basophils Absolute: 0 K/uL (ref 0.0–0.1)
Basophils Relative: 1 %
Eosinophils Absolute: 0 K/uL (ref 0.0–0.5)
Eosinophils Relative: 0 %
HCT: 25 % — ABNORMAL LOW (ref 36.0–46.0)
Hemoglobin: 8.1 g/dL — ABNORMAL LOW (ref 12.0–15.0)
Immature Granulocytes: 0 %
Lymphocytes Relative: 10 %
Lymphs Abs: 0.8 K/uL (ref 0.7–4.0)
MCH: 30.6 pg (ref 26.0–34.0)
MCHC: 32.4 g/dL (ref 30.0–36.0)
MCV: 94.3 fL (ref 80.0–100.0)
Monocytes Absolute: 0.1 K/uL (ref 0.1–1.0)
Monocytes Relative: 1 %
Neutro Abs: 7 K/uL (ref 1.7–7.7)
Neutrophils Relative %: 88 %
Platelets: 142 K/uL — ABNORMAL LOW (ref 150–400)
RBC: 2.65 MIL/uL — ABNORMAL LOW (ref 3.87–5.11)
RDW: 11.9 % (ref 11.5–15.5)
WBC: 8 K/uL (ref 4.0–10.5)
nRBC: 0 % (ref 0.0–0.2)

## 2024-06-13 LAB — IRON AND TIBC
Iron: 61 ug/dL (ref 28–170)
Saturation Ratios: 46 % — ABNORMAL HIGH (ref 10.4–31.8)
TIBC: 134 ug/dL — ABNORMAL LOW (ref 250–450)
UIBC: 73 ug/dL

## 2024-06-13 LAB — FERRITIN: Ferritin: 1459 ng/mL — ABNORMAL HIGH (ref 11–307)

## 2024-06-13 LAB — LACTATE DEHYDROGENASE: LDH: 158 U/L (ref 105–235)

## 2024-06-13 LAB — VITAMIN B12: Vitamin B-12: 755 pg/mL (ref 180–914)

## 2024-06-13 LAB — FOLATE: Folate: 18.8 ng/mL

## 2024-06-13 NOTE — Progress Notes (Signed)
 Pt in to re establish care.  Pt was recently discharged from hospital on 05/30/24.  Pt very weak reports being lightheaded pt BP low with a slight drop orthostatic.  Pt is a diabetic and reports sugar all over. Pt with end stage renal disease. Pt given water  and crackers. Pt reports being depressed and has been waiting for PCP to refer to specialist for antidepressants. Pt also has had weight loss of 8 lbs since hospital discharge on 05/30/24.    400pm, reviewed with pt to continue to push by mouth fluids and check BP if possible. If she continues to run low BP or feels faint or dizzy she needs to go be evaluated in ED.  Pt verbalized understanding.

## 2024-06-13 NOTE — Assessment & Plan Note (Signed)
 Patient was orthostatic hypotensive in the clinic.  Likely due to poor oral intake from gastroparesis. BP improved after drinking water  in the clinic. Recommend patient to increase oral hydration and monitor BP and symptoms at home.  If persistently low, advised patient to go to emergency room for evaluation.

## 2024-06-13 NOTE — Assessment & Plan Note (Signed)
 Follow-up with endocrinologist for diabetes control.  Follow-up with gastroenterology.

## 2024-06-13 NOTE — Assessment & Plan Note (Addendum)
 Chronic anemia, secondary to chronic kidney disease. Previous Marrow biopsy results were reviewed and discussed with patient.  Slightly hypocellular marrow with trilineage hematopoiesis.  Cytogenetics normal.  Lab Results  Component Value Date   HGB 8.1 (L) 06/13/2024   TIBC 134 (L) 06/13/2024   IRONPCTSAT 46 (H) 06/13/2024   FERRITIN 1,459 (H) 06/13/2024     We discussed about erythropoietin  replacement therapy. Rationale potential side effects reviewed.  She agrees. Will start her on Retacrit 20,000 units every 2 weeks.  Follow-up in 6 weeks.

## 2024-06-13 NOTE — Assessment & Plan Note (Signed)
 Check folate, b12, flowcytometry

## 2024-06-13 NOTE — Progress Notes (Signed)
 "  Hematology/Oncology Progress note [phone] [phone]     Patient Care Team: Ostwalt, [name], PA-C as PCP - General (Physician Assistant) Darliss Rogue, MD as PCP - Cardiology (Cardiology) Jordis Laneta FALCON, MD as Consulting Physician (General Surgery) Babara Call, MD as Consulting Physician (Oncology) Issac Peyton BRAVO, RPH-CPP (Pharmacist)    Date of visit: 06/13/24  REASON FOR VISIT:  Anemia ASSESSMENT & PLAN:   Anemia in chronic kidney disease (CKD) Chronic anemia, secondary to chronic kidney disease. Previous Marrow biopsy results were reviewed and discussed with patient.  Slightly hypocellular marrow with trilineage hematopoiesis.  Cytogenetics normal.  Lab Results  Component Value Date   HGB 8.1 (L) 06/13/2024   TIBC 134 (L) 06/13/2024   IRONPCTSAT 46 (H) 06/13/2024   FERRITIN 1,459 (H) 06/13/2024     We discussed about erythropoietin  replacement therapy. Rationale potential side effects reviewed.  She agrees. Will start her on Retacrit 20,000 units every 2 weeks.  Follow-up in 6 weeks.   Gastroparesis Follow-up with endocrinologist for diabetes control.  Follow-up with gastroenterology.  Hypotension Patient was orthostatic hypotensive in the clinic.  Likely due to poor oral intake from gastroparesis. BP improved after drinking water  in the clinic. Recommend patient to increase oral hydration and monitor BP and symptoms at home.  If persistently low, advised patient to go to emergency room for evaluation.  Zinc  deficiency Check zinc  level at next visit.   Thrombocytopenia Check folate, b12, flowcytometry    Orders Placed This Encounter  Procedures   Ferritin    Standing Status:   Future    Number of Occurrences:   1    Expected Date:   06/13/2024    Expiration Date:   09/11/2024   Iron  and TIBC    Standing Status:   Future    Number of Occurrences:   1    Expected Date:   06/13/2024    Expiration Date:   09/11/2024   CBC with  Differential/Platelet    Standing Status:   Future    Number of Occurrences:   1    Expected Date:   06/13/2024    Expiration Date:   09/11/2024   Folate    Standing Status:   Future    Number of Occurrences:   1    Expected Date:   06/13/2024    Expiration Date:   09/11/2024   Vitamin B12    Standing Status:   Future    Number of Occurrences:   1    Expected Date:   06/13/2024    Expiration Date:   09/11/2024   Lactate dehydrogenase    Standing Status:   Future    Number of Occurrences:   1    Expected Date:   06/13/2024    Expiration Date:   09/11/2024   Haptoglobin    Standing Status:   Future    Number of Occurrences:   1    Expected Date:   06/13/2024    Expiration Date:   09/11/2024   Flow cytometry panel-leukemia/lymphoma work-up    Standing Status:   Future    Number of Occurrences:   1    Expected Date:   06/13/2024    Expiration Date:   09/11/2024   Follow-up 3 months All questions were answered. The patient knows to call the clinic with any problems, questions or concerns.  Call Babara, MD, PhD [facility] Hematology Oncology 06/13/2024   INTERVAL HISTORY-   34 y.o. female with past  medical history including diabetes type 1, severe gastroparesis, severe malnutrition, status post jejunostomy tube, adrenal insufficiency, CKD stage IIIb, hypertension, hypothyroidism, history of right upper extremity DVT, off anticoagulation, chronic anemia presents to reestablish care. Patient was last seen by me in July 2022.  Patient had a workup done at that time and there was plan for bone marrow biopsy and she lost to follow-up.  11/01/2022 - 11/15/2022  Patient was admitted due to gastroparesis, left-sided abdominal pain, nausea vomiting dehydration.  Hematology was consulted for anemia, hemoglobin dropped to 6.9.  Patient was seen by my colleague.  Workup showed normal LDH, normal haptoglobin.  Negative M protein on SPEP.    Normal B1, B6.  Normal folate level.  Erythropoietin  level  11.3. She had a normal B12 level in March 2023.  she has a low zinc  level 34. Patient received PRBC transfusion  12/16/2022, Bone marrow biopsy showed Slightly hypocellular bone marrow for age with trilineage hematopoiesis. There is no evidence of a lymphoproliferative disorder.  Cytogenetics are normal.   INTERVAL HISTORY Ashley Camacho is a 34 y.o. female who has above history reviewed by me today presents for follow up visit for anemia Discussed the use of AI scribe software for clinical note transcription with the patient, who gave verbal consent to proceed.   Patient has anemia and chronic kidney disease.  Last seen in August 2024.  She presents to reestablish care.  She was previously recommended for erythropoiesis-stimulating agent (ESA) therapy. She did not return for follow-up due to feeling overwhelmed.   Gastroparesis is managed by gastroenterology with annual visits. Symptoms are infrequent, though she occasionally experiences postprandial malaise. She previously had a jejunostomy tube for feeding, which was not replaced after dislodgement, as she has maintained adequate oral intake since its removal.   Approximately two weeks ago, she was hospitalized for influenza A, during which she experienced a single episode of hematemesis and was monitored overnight. At that time, her blood counts were low, whereas two months prior, her white blood cell and platelet counts were within normal limits. She is not currently receiving iron  supplementation, and prior iron  studies have shown elevated levels.   She has a history of recurrent hypotension. Over the past several days, she has experienced significant lightheadedness and dizziness, which she attributes to poor oral intake and possible dehydration. She notes difficulty maintaining adequate hydration and reports fluctuating blood glucose levels. Her weight is lower compared to November 2025, which she attributes to frequent illnesses,  including the recent influenza A infection. She denies chest pain, shortness of breath, fever, chills, nausea, vomiting, diarrhea, hematochezia, or urinary symptoms.   Review of systems- Review of Systems  Constitutional:  Positive for appetite change, fatigue and unexpected weight change.  Eyes:  Negative for icterus.  Respiratory:  Negative for shortness of breath.   Cardiovascular:  Negative for chest pain.  Gastrointestinal:  Negative for abdominal pain, nausea and vomiting.  Genitourinary:  Negative for difficulty urinating.   Musculoskeletal:  Negative for arthralgias.  Skin:  Negative for rash.  Neurological:  Positive for dizziness. Negative for headaches.  Hematological:  Negative for adenopathy.  Psychiatric/Behavioral:  Negative for confusion.   .  No Known Allergies  Patient Active Problem List   Diagnosis Date Noted   Anemia in chronic kidney disease (CKD) 12/30/2022    Priority: High   DVT (deep venous thrombosis) (HCC) 11/20/2022    Priority: Medium    Hypotension 06/10/2022    Priority: Medium  Protein-calorie malnutrition, severe 03/11/2021    Priority: Medium    Zinc  deficiency     Priority: Medium    Gastroparesis 04/22/2016    Priority: Medium    Normocytic anemia 11/12/2022    Priority: Low   Influenza A 05/28/2024   Elevated troponin 05/28/2024   Dyslipidemia 05/27/2024   Pancreatic insufficiency 05/27/2024   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) 05/27/2024   Nonrheumatic mitral valve regurgitation 01/06/2024   Volume overload 01/03/2024   Chest pain 01/02/2024   Leg swelling 12/21/2023   HTN (hypertension) 11/22/2023   Chronic diastolic CHF (congestive heart failure) (HCC) 11/22/2023   Abnormal LFTs 11/22/2023   HCAP (healthcare-associated pneumonia) 11/22/2023   Generalized weakness 11/22/2023   Sepsis due to pneumonia (HCC) 11/13/2023   ABLA (acute blood loss anemia) 11/13/2023   Moderate protein malnutrition  11/13/2023   Hyponatremia 11/13/2023   Norovirus 11/02/2023   Sepsis secondary to UTI (HCC) 10/31/2023   Sepsis due to urinary tract infection (HCC) 10/31/2023   Stage 3b chronic kidney disease (HCC) 10/25/2023   Depression, recurrent 10/25/2023   Tinnitus of right ear 10/25/2023   Encounter for central line placement 10/10/2023   History of DVT (deep vein thrombosis) 05/26/2023   Lactic acid acidosis 05/26/2023   Depression 04/07/2023   Adrenal insufficiency 04/07/2023   Malnutrition of moderate degree 03/24/2023   Type 1 diabetes mellitus with hyperosmolar hyperglycemic state (HHS) (HCC) 03/23/2023   Diabetes mellitus type 1 with hyperosmolarity (HCC) 03/23/2023   Anemia    AKI (acute kidney injury) 11/22/2022   Dislodged jejunostomy tube 11/22/2022   Type 1 diabetes mellitus (HCC) 11/22/2022   Failure to thrive in adult 11/13/2022   Hyperglycemia 11/13/2022   Intractable nausea and vomiting 11/13/2022   Severe recurrent major depression (HCC) 11/03/2022   CKD stage 3a, GFR 45-59 ml/min (HCC) 11/01/2022   Elevated serum creatinine 11/01/2022   Abdominal pain 11/01/2022   Cystitis 11/01/2022   Acute exacerbation of congestive heart failure (HCC) 08/23/2022   Diabetic ketoacidosis (HCC) 08/23/2022   Hypothermia 07/27/2022   CKD stage 3b, GFR 30-44 ml/min (HCC) 06/29/2022   Hypovolemic shock (HCC) 06/28/2022   Jejunostomy tube present (HCC) 06/28/2022   Insomnia 06/20/2022   Hypophosphatemia 06/13/2022   Severe malnutrition 06/13/2022   Pseudohyponatremia 06/10/2022   Hypomagnesemia 06/10/2022   Gastroenteritis 02/28/2022   Hypocalcemia 02/28/2022   Metabolic acidosis 02/28/2022   Essential hypertension 02/28/2022   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   Nausea & vomiting 02/28/2022   High anion gap metabolic acidosis 01/02/2022   UTI (urinary tract infection) 11/03/2021   Hypoglycemia due to type 1 diabetes mellitus (HCC) 11/02/2021   Thrombocytopenia 11/02/2021    Problem related to social environment 09/24/2021   Rhinovirus 09/22/2021   Acute on chronic HFrEF (heart failure with reduced ejection fraction) (HCC) 09/07/2021   Generalized anxiety disorder 09/01/2021   Pneumonia due to human metapneumovirus 08/31/2021   Gastro-esophageal reflux disease with esophagitis 08/31/2021   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/31/2021   Congestive heart failure (HCC) 08/31/2021   Hypoglycemia due to insulin  06/14/2021   DKA (diabetic ketoacidosis) (HCC) 06/14/2021   CKD stage 3 due to type 1 diabetes mellitus (HCC) 06/14/2021   Transaminitis 06/14/2021   Hypoglycemia associated with diabetes (HCC) 06/03/2021   DKA, type 1 (HCC) 05/20/2021   Major depressive disorder, recurrent episode, moderate (HCC) 05/08/2021   Right arm pain 05/07/2021   Rash of hand 05/07/2021   Hyperkalemia 05/06/2021   History of adrenal insufficiency 05/06/2021  CAP (community acquired pneumonia) 05/05/2021   Hypoglycemia 04/27/2021   Combined systolic and diastolic heart failure (HCC) 03/11/2021   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) 03/11/2021   Adrenal insufficiency (Addison's disease) (HCC) 03/10/2021   Postoperative hematoma involving genitourinary system following genitourinary procedure    S/P cesarean section 02/17/2021   Acute esophagitis    Leg edema    History of preterm delivery, currently pregnant    Pre-existing type 1 diabetes mellitus during pregnancy in third trimester    Anemia of chronic disease    Type 1 diabetes mellitus affecting pregnancy in second trimester, antepartum    HFrEF (heart failure with reduced ejection fraction) (HCC)    Acute deep vein thrombosis (DVT) of brachial vein of right upper extremity (HCC) 12/17/2020   Anxiety 12/17/2020   Diabetic retinopathy (HCC) 12/17/2020   Chronic combined systolic and diastolic CHF (congestive heart failure) (HCC)    Hyperemesis 12/13/2020   Type 1 diabetes mellitus with  hypoglycemia without coma (HCC) 12/13/2020   Sunburn 12/13/2020   Hypothyroidism 12/10/2020   Malnutrition 12/10/2020   Vitreous hemorrhage of right eye (HCC) 12/10/2020   Pleural effusion associated with pulmonary infection 11/25/2020   Pneumonia affecting pregnancy 11/24/2020   Diabetes mellitus affecting pregnancy, second trimester 11/24/2020   Edema 11/24/2020   Decreased urine output 11/24/2020   Shortness of breath    SOB (shortness of breath)    Social problem 11/21/2020   Nausea and vomiting during pregnancy prior to [redacted] weeks gestation 11/21/2020   Low serum vitamin B12    Delivery with history of C-section 11/20/2020   Absolute anemia    Acute kidney injury superimposed on chronic kidney disease    Nausea and vomiting during pregnancy 10/23/2020   History of premature delivery, currently pregnant, second trimester 10/13/2020   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   Peripheral neuropathy 09/28/2018   Diabetic sensorimotor neuropathy (HCC) 04/19/2016   DKA (diabetic ketoacidosis) (HCC) 04/03/2016   Diabetic neuropathy (HCC) 02/16/2016   Acute bronchitis 06/01/2010   Microalbuminuria 09/26/2005     Past Medical History:  Diagnosis Date   Acute metabolic encephalopathy 06/10/2022   Adrenal insufficiency    Anemia    Anxiety    Blood transfusion without reported diagnosis    CHF (congestive heart failure) (HCC)    Chronic kidney disease    Depression    Gastroparesis    Hypertension    Hypotension    Intractable nausea and vomiting 11/13/2022   Migraine    Thyroid  disease    Type 1 diabetes (HCC)      Past Surgical History:  Procedure Laterality Date   BIOPSY  01/14/2021   Procedure: BIOPSY;  Surgeon: Eda Iha, MD;  Location: [facility] ENDOSCOPY;  Service: Gastroenterology;;   CENTRAL LINE INSERTION N/A 10/10/2023   Procedure: CENTRAL LINE INSERTION;  Surgeon: Marea Selinda RAMAN, MD;  Location: [facility] INVASIVE CV LAB;  Service: Cardiovascular;  Laterality: N/A;    CESAREAN SECTION     x2   CESAREAN SECTION WITH BILATERAL TUBAL LIGATION N/A 02/17/2021   Procedure: CESAREAN SECTION WITH BILATERAL TUBAL LIGATION;  Surgeon: Barbra Lang PARAS, DO;  Location: [facility] LD ORS;  Service: Obstetrics;  Laterality: N/A;   ESOPHAGOGASTRODUODENOSCOPY (EGD) WITH PROPOFOL  N/A 01/14/2021   Procedure: ESOPHAGOGASTRODUODENOSCOPY (EGD) WITH PROPOFOL ;  Surgeon: Eda Iha, MD;  Location: [facility] ENDOSCOPY;  Service: Gastroenterology;  Laterality: N/A;   IR BONE MARROW BIOPSY & ASPIRATION  12/16/2022   IR REPLC DUODEN/JEJUNO TUBE PERCUT W/FLUORO  11/22/2022  JEJUNOSTOMY N/A 06/13/2022   Procedure: JEJUNOSTOMY;  Surgeon: Jordis Laneta FALCON, MD;  Location: [facility] ORS;  Service: General;  Laterality: N/A;   MOUTH SURGERY     TEE WITHOUT CARDIOVERSION N/A 01/06/2024   Procedure: ECHOCARDIOGRAM, TRANSESOPHAGEAL;  Surgeon: Rolan Ezra RAMAN, MD;  Location: [facility] ORS;  Service: Cardiovascular;  Laterality: N/A;   TUBAL LIGATION      Social History   Socioeconomic History   Marital status: Single    Spouse name: Not on file   Number of children: Not on file   Years of education: Not on file   Highest education level: 9th grade  Occupational History   Occupation: home maker  Tobacco Use   Smoking status: Never    Passive exposure: Never   Smokeless tobacco: Never  Vaping Use   Vaping status: Never Used  Substance and Sexual Activity   Alcohol use: No   Drug use: No   Sexual activity: Not Currently    Birth control/protection: None  Other Topics Concern   Not on file  Social History Narrative   Not on file   Social Drivers of Health   Tobacco Use: Low Risk (06/13/2024)   Patient History    Smoking Tobacco Use: Never    Smokeless Tobacco Use: Never    Passive Exposure: Never  Financial Resource Strain: Medium Risk (04/11/2024)   Overall Financial Resource Strain (CARDIA)    Difficulty of Paying Living Expenses: Somewhat hard  Food Insecurity: Food Insecurity Present  (05/30/2024)   Epic    Worried About Programme Researcher, Broadcasting/film/video in the Last Year: Sometimes true    Ran Out of Food in the Last Year: Sometimes true  Transportation Needs: No Transportation Needs (05/30/2024)   Epic    Lack of Transportation (Medical): No    Lack of Transportation (Non-Medical): No  Physical Activity: Insufficiently Active (04/11/2024)   Exercise Vital Sign    Days of Exercise per Week: 3 days    Minutes of Exercise per Session: 20 min  Stress: Stress Concern Present (04/11/2024)   [employer] of Occupational Health - Occupational Stress Questionnaire    Feeling of Stress: Rather much  Social Connections: Socially Isolated (04/11/2024)   Social Connection and Isolation Panel    Frequency of Communication with Friends and Family: More than three times a week    Frequency of Social Gatherings with Friends and Family: More than three times a week    Attends Religious Services: Never    Database Administrator or Organizations: No    Attends Engineer, Structural: Not on file    Marital Status: Never married  Intimate Partner Violence: Not At Risk (05/30/2024)   Epic    Fear of Current or Ex-Partner: No    Emotionally Abused: No    Physically Abused: No    Sexually Abused: No  Depression (PHQ2-9): Medium Risk (06/13/2024)   Depression (PHQ2-9)    PHQ-2 Score: 6  Alcohol Screen: Low Risk (04/11/2024)   Alcohol Screen    Last Alcohol Screening Score (AUDIT): 1  Housing: High Risk (05/30/2024)   Epic    Unable to Pay for Housing in the Last Year: Yes    Number of Times Moved in the Last Year: 1    Homeless in the Last Year: No  Utilities: Not At Risk (05/30/2024)   Epic    Threatened with loss of utilities: No  Health Literacy: Adequate Health Literacy (12/21/2023)   B1300 Health Literacy  Frequency of need for help with medical instructions: Never     Family History  Problem Relation Age of Onset   Breast cancer Mother    Seizures Mother    Cancer Mother     Diabetes type I Father    CAD Father    Diabetes Father    Early death Father    Lung cancer Maternal Grandfather    CAD Paternal Grandmother    Diabetes Paternal Grandmother    CAD Paternal Grandfather    Diabetes Paternal Grandfather    ADD / ADHD Daughter    Diabetes Maternal Uncle    Ovarian cancer Neg Hx      Current Outpatient Medications:    Accu-Chek Softclix Lancets lancets, Use to check blood sugar up to 6 times daily. May substitute to any manufacturer covered by patient's insurance., Disp: 100 each, Rfl: 5   acetaminophen  (TYLENOL ) 500 MG tablet, Take 2 tablets (1,000 mg total) by mouth every 6 (six) hours as needed for headache, fever or moderate pain., Disp: 30 tablet, Rfl: 3   BD PEN NEEDLE MINI ULTRAFINE 31G X 5 MM MISC, by Other route., Disp: , Rfl:    Blood Glucose Monitoring Suppl (BLOOD GLUCOSE MONITOR SYSTEM) w/Device KIT, Use to check blood sugar up to 6 times daily. May substitute to any manufacturer covered by patient's insurance., Disp: 1 kit, Rfl: 0   Continuous Glucose Sensor (DEXCOM G7 SENSOR) MISC, Use to check blood sugar. Change every 10 days, Disp: 3 each, Rfl: 3   furosemide  (LASIX ) 20 MG tablet, Take 1 tablet (20 mg total) by mouth daily as needed., Disp: 30 tablet, Rfl: 3   Glucose Blood (BLOOD GLUCOSE TEST STRIPS) STRP, Use to check blood sugar up to 6 times daily. May substitute to any manufacturer covered by patient's insurance., Disp: 100 strip, Rfl: 5   hydrocortisone  (CORTEF ) 10 MG tablet, Take 2 tablets (20 mg total) by mouth daily with breakfast AND 1 tablet (10 mg total) daily with supper., Disp: 90 tablet, Rfl: 0   Insulin  Pen Needle (PEN NEEDLES) 31G X 6 MM MISC, PEN NEEDLES 31G X 6 MM, Disp: , Rfl:    Lancet Device MISC, Use to check blood sugar up to 6 times daily. May substitute to any manufacturer covered by patient's insurance., Disp: 1 each, Rfl: 1   levothyroxine  (SYNTHROID ) 75 MCG tablet, TAKE 1 TABLET (75 MCG TOTAL) BY MOUTH DAILY AT 6  (SIX) AM., Disp: 90 tablet, Rfl: 0   metoprolol  succinate (TOPROL -XL) 25 MG 24 hr tablet, TAKE 1 TABLET (25 MG TOTAL) BY MOUTH DAILY., Disp: 90 tablet, Rfl: 1   nortriptyline (PAMELOR) 25 MG capsule, Take 25 mg by mouth at bedtime., Disp: , Rfl:    NOVOLOG  100 UNIT/ML injection, Inject 0-50 Units into the skin as needed for high blood sugar. FOR USE IN INSULIN  PUMP. MAX 50 UNITS PER DAY., Disp: , Rfl:    rosuvastatin  (CRESTOR ) 10 MG tablet, Take 1 tablet (10 mg total) by mouth daily., Disp: 30 tablet, Rfl: 11   sacubitril -valsartan  (ENTRESTO ) 24-26 MG, Take 1 tablet by mouth 2 (two) times daily., Disp: 60 tablet, Rfl: 2   sodium zirconium cyclosilicate  (LOKELMA ) 10 g PACK packet, Take 10 g by mouth daily., Disp: 30 packet, Rfl: 3   guaiFENesin  (MUCINEX ) 600 MG 12 hr tablet, Take 1 tablet (600 mg total) by mouth 2 (two) times daily. (Patient not taking: Reported on 06/13/2024), Disp: 20 tablet, Rfl: 0   insulin  aspart (NOVOLOG ) 100 UNIT/ML  FlexPen, Inject 2 Units into the skin 3 (three) times daily with meals. If eating and Blood Glucose (BG) 80 or higher inject 2 units for meal coverage and add correction dose per scale. If not eating, correction dose only. BG <150= 0 unit; BG 150-200= 1 unit; BG 201-250= 3 unit; BG 251-300= 5 unit; BG 301-350= 7 unit; BG 351-400= 9 unit; BG >400= 11 unit and Call Primary Care. (Patient not taking: Reported on 06/13/2024), Disp: , Rfl:    oseltamivir  (TAMIFLU ) 30 MG capsule, Take 1 capsule (30 mg total) by mouth daily. (Patient not taking: Reported on 06/13/2024), Disp: 3 capsule, Rfl: 0   Physical exam:  Vitals:   06/13/24 1508 06/13/24 1510 06/13/24 1543  BP: (!) 79/55 (!) 75/54 (!) 88/58  Pulse: 85 94   Resp: 14    Temp: 97.6 F (36.4 C)    TempSrc: Tympanic    SpO2: 98%    Weight: 107 lb 14.4 oz (48.9 kg)     Physical Exam Constitutional:      Appearance: She is not diaphoretic.  HENT:     Head: Normocephalic and atraumatic.  Eyes:     General: No  scleral icterus. Cardiovascular:     Rate and Rhythm: Normal rate.  Pulmonary:     Effort: Pulmonary effort is normal. No respiratory distress.     Breath sounds: Normal breath sounds.  Abdominal:     Palpations: Abdomen is soft.     Comments: J tube  Musculoskeletal:        General: Normal range of motion.     Cervical back: Normal range of motion and neck supple.  Skin:    General: Skin is warm and dry.     Coloration: Skin is pale.     Findings: No erythema.  Neurological:     Mental Status: She is alert and oriented to person, place, and time. Mental status is at baseline.     Cranial Nerves: No cranial nerve deficit.     Motor: No abnormal muscle tone.     Coordination: Coordination normal.  Psychiatric:        Mood and Affect: Mood and affect normal.           Latest Ref Rng & Units 05/30/2024    4:44 AM  CMP  Glucose 70 - 99 mg/dL 876   BUN 6 - 20 mg/dL 25   Creatinine 9.55 - 1.00 mg/dL 7.94   Sodium 864 - 854 mmol/L 142   Potassium 3.5 - 5.1 mmol/L 4.6   Chloride 98 - 111 mmol/L 108   CO2 22 - 32 mmol/L 21   Calcium  8.9 - 10.3 mg/dL 8.2       Latest Ref Rng & Units 06/13/2024    4:06 PM  CBC  WBC 4.0 - 10.5 K/uL 8.0   Hemoglobin 12.0 - 15.0 g/dL 8.1   Hematocrit 63.9 - 46.0 % 25.0   Platelets 150 - 400 K/uL 142     RADIOGRAPHIC STUDIES: I have personally reviewed the radiological images as listed and agreed with the findings in the report. ECHOCARDIOGRAM COMPLETE Result Date: 05/28/2024    ECHOCARDIOGRAM REPORT   Patient Name:   Ashley Camacho Date of Exam: 05/28/2024 Medical Rec #:  [id number]       Height:       61.0 in Accession #:    [id number]      Weight:       118.0 lb Date of Birth:  1990/06/02  BSA:          1.509 m Patient Age:    33 years        BP:           73/47 mmHg Patient Gender: F               HR:           76 bpm. Exam Location:  [facility] Procedure: 2D Echo, Cardiac Doppler, Color Doppler, Strain Analysis and 3D Echo            (Both  Spectral and Color Flow Doppler were utilized during            procedure). 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 CHF; Risk Factors:Hypertension.  Sonographer:     Christopher Furnace Referring Phys:  RYAN M DUNN Diagnosing Phys: Deatrice Cage MD IMPRESSIONS  1. Left ventricular ejection fraction, by estimation, is 40 to 45%. The left ventricle has mildly decreased function. The left ventricle demonstrates global hypokinesis. Left ventricular diastolic parameters are consistent with Grade I diastolic dysfunction (impaired relaxation).  2. Right ventricular systolic function is normal. The right ventricular size is normal. Tricuspid regurgitation signal is inadequate for assessing PA pressure.  3. The mitral valve is normal in structure. No evidence of mitral valve regurgitation. No evidence of mitral stenosis.  4. The aortic valve is normal in structure. Aortic valve regurgitation is not visualized. No aortic stenosis is present.  5. The inferior vena cava is normal in size with greater than 50% respiratory variability, suggesting right atrial pressure of 3 mmHg. FINDINGS  Left Ventricle: Left ventricular ejection fraction, by estimation, is 40 to 45%. The left ventricle has mildly decreased function. The left ventricle demonstrates global hypokinesis. Global longitudinal strain performed but not reported based on interpreter judgement due to suboptimal tracking. The left ventricular internal cavity size was normal in size. There is no left ventricular hypertrophy. Left ventricular diastolic parameters are consistent with Grade I diastolic dysfunction (impaired relaxation). Right Ventricle: The right ventricular size is normal. No increase in right ventricular wall thickness. Right ventricular systolic function is normal. Tricuspid regurgitation signal is inadequate for assessing PA pressure. The  tricuspid regurgitant velocity is 1.46 m/s, and with an assumed right atrial pressure of 3 mmHg, the estimated right ventricular systolic pressure is 11.5 mmHg. Left Atrium: Left atrial size was normal in size. Right Atrium: Right atrial size was normal in size. Pericardium: There is no evidence of pericardial effusion. Mitral Valve: The mitral valve is normal in structure. No evidence of mitral valve regurgitation. No evidence of mitral valve stenosis. MV peak gradient, 3.0 mmHg. The mean mitral valve gradient is 2.0 mmHg. Tricuspid Valve: The tricuspid valve is normal in structure. Tricuspid valve regurgitation is not demonstrated. No evidence of tricuspid stenosis. Aortic Valve: The aortic valve is normal in structure. Aortic valve regurgitation is not visualized. No aortic stenosis is present. Aortic valve mean gradient measures 3.0 mmHg. Aortic valve peak gradient measures 5.5 mmHg. Aortic valve area, by VTI measures 2.85 cm. Pulmonic Valve: The pulmonic valve was normal in structure. Pulmonic valve regurgitation is not visualized. No evidence of pulmonic stenosis. Aorta: The aortic root is normal in size and structure. Venous: The inferior vena cava is normal in size with  greater than 50% respiratory variability, suggesting right atrial pressure of 3 mmHg. IAS/Shunts: No atrial level shunt detected by color flow Doppler.  LEFT VENTRICLE PLAX 2D LVIDd:         3.80 cm   Diastology LVIDs:         2.80 cm   LV e' medial:    10.90 cm/s LV PW:         0.90 cm   LV E/e' medial:  6.3 LV IVS:        0.80 cm   LV e' lateral:   11.70 cm/s LVOT diam:     2.00 cm   LV E/e' lateral: 5.9 LV SV:         48 LV SV Index:   32 LVOT Area:     3.14 cm LV IVRT:       124 msec                          3D Volume EF:                          3D EF:        38 % RIGHT VENTRICLE RV Basal diam:  2.80 cm RV Mid diam:    2.80 cm LEFT ATRIUM             Index        RIGHT ATRIUM           Index LA diam:        2.20 cm 1.46 cm/m   RA  Area:     10.10 cm LA Vol (A2C):   20.0 ml 13.25 ml/m  RA Volume:   20.90 ml  13.85 ml/m LA Vol (A4C):   19.8 ml 13.12 ml/m LA Biplane Vol: 20.7 ml 13.71 ml/m  AORTIC VALVE AV Area (Vmax):    2.57 cm AV Area (Vmean):   2.82 cm AV Area (VTI):     2.85 cm AV Vmax:           117.00 cm/s AV Vmean:          75.100 cm/s AV VTI:            0.170 m AV Peak Grad:      5.5 mmHg AV Mean Grad:      3.0 mmHg LVOT Vmax:         95.80 cm/s LVOT Vmean:        67.400 cm/s LVOT VTI:          0.154 m LVOT/AV VTI ratio: 0.91  AORTA Ao Root diam: 2.60 cm MITRAL VALVE               TRICUSPID VALVE MV Area (PHT): 5.79 cm    TR Peak grad:   8.5 mmHg MV Area VTI:   2.91 cm    TR Vmax:        146.00 cm/s MV Peak grad:  3.0 mmHg MV Mean grad:  2.0 mmHg    SHUNTS MV Vmax:       0.86 m/s    Systemic VTI:  0.15 m MV Vmean:      66.3 cm/s   Systemic Diam: 2.00 cm MV Decel Time: 131 msec MV E velocity: 68.70 cm/s MV A velocity: 83.30 cm/s MV E/A ratio:  0.82 Deatrice Cage MD Electronically signed by Deatrice Cage MD Signature Date/Time: 05/28/2024/3:06:45 PM    Final  DG Chest 2 View Result Date: 05/27/2024 EXAM: 2 VIEW(S) XRAY OF THE CHEST 05/27/2024 08:13:00 PM COMPARISON: 01/02/2024 CLINICAL HISTORY: CP FINDINGS: LUNGS AND PLEURA: No focal pulmonary opacity. No pleural effusion. No pneumothorax. HEART AND MEDIASTINUM: No acute abnormality of the cardiac and mediastinal silhouettes. BONES AND SOFT TISSUES: No acute osseous abnormality. IMPRESSION: 1. No active cardiopulmonary disease. Electronically signed by: Franky Crease MD 05/27/2024 08:16 PM EST RP Workstation: [id number]   "

## 2024-06-13 NOTE — Assessment & Plan Note (Signed)
 Check zinc  level at next visit.

## 2024-06-14 ENCOUNTER — Encounter: Payer: Self-pay | Admitting: Oncology

## 2024-06-14 ENCOUNTER — Telehealth: Payer: Self-pay

## 2024-06-14 ENCOUNTER — Other Ambulatory Visit: Payer: Self-pay

## 2024-06-14 LAB — HAPTOGLOBIN: Haptoglobin: 115 mg/dL (ref 33–278)

## 2024-06-14 NOTE — Telephone Encounter (Signed)
 Brief Telephone Documentation Reason for Call: Patient left message regarding question for pharmacist about coverage of Dexcom G7 sensors  Summary of Call: Per [facility] pharmacy, medication can be filled on insurance for $0. Left message with patient to relay this information.    Yaritza Leist E. Marsh, PharmD, BCACP, CPP Clinical Pharmacist [facility] [phone]

## 2024-06-14 NOTE — Telephone Encounter (Signed)
 Please schedule and notify pt of appts:   Retacrit this week (Looks like retacrit has auth, so ok to schedule today or tomorrow.) 2 weeks lab/ +/- retacrit (HH) 4 weeks lab/ +/- retacrit (HH)  6 weeks: lab/MD / +/- retacrit

## 2024-06-14 NOTE — Telephone Encounter (Signed)
-----   Message from Zelphia Cap, MD sent at 06/13/2024  9:42 PM EST ----- Please arrange patient to get Retacrit this week. 2 weeks lab H&H +/- Retacrit 4 weeks lab H&H +/- Retacrit Follow-up in 6 weeks lab MD +/- Retacrit.  Labs are ordered.  Thank you

## 2024-06-15 LAB — COMP PANEL: LEUKEMIA/LYMPHOMA

## 2024-06-17 ENCOUNTER — Inpatient Hospital Stay

## 2024-06-17 ENCOUNTER — Emergency Department

## 2024-06-17 ENCOUNTER — Inpatient Hospital Stay
Admission: EM | Admit: 2024-06-17 | Source: Home / Self Care | Attending: Student in an Organized Health Care Education/Training Program | Admitting: Student in an Organized Health Care Education/Training Program

## 2024-06-17 DIAGNOSIS — E162 Hypoglycemia, unspecified: Principal | ICD-10-CM | POA: Diagnosis present

## 2024-06-17 DIAGNOSIS — Z86718 Personal history of other venous thrombosis and embolism: Secondary | ICD-10-CM

## 2024-06-17 DIAGNOSIS — E039 Hypothyroidism, unspecified: Secondary | ICD-10-CM | POA: Diagnosis present

## 2024-06-17 DIAGNOSIS — F419 Anxiety disorder, unspecified: Secondary | ICD-10-CM | POA: Diagnosis present

## 2024-06-17 DIAGNOSIS — R627 Adult failure to thrive: Secondary | ICD-10-CM | POA: Diagnosis present

## 2024-06-17 DIAGNOSIS — I10 Essential (primary) hypertension: Secondary | ICD-10-CM | POA: Diagnosis present

## 2024-06-17 DIAGNOSIS — F339 Major depressive disorder, recurrent, unspecified: Secondary | ICD-10-CM | POA: Diagnosis present

## 2024-06-17 DIAGNOSIS — J9601 Acute respiratory failure with hypoxia: Secondary | ICD-10-CM | POA: Diagnosis not present

## 2024-06-17 DIAGNOSIS — E2740 Unspecified adrenocortical insufficiency: Secondary | ICD-10-CM | POA: Diagnosis present

## 2024-06-17 DIAGNOSIS — G928 Other toxic encephalopathy: Secondary | ICD-10-CM | POA: Diagnosis not present

## 2024-06-17 DIAGNOSIS — D631 Anemia in chronic kidney disease: Secondary | ICD-10-CM | POA: Diagnosis present

## 2024-06-17 DIAGNOSIS — F99 Mental disorder, not otherwise specified: Secondary | ICD-10-CM

## 2024-06-17 DIAGNOSIS — R4182 Altered mental status, unspecified: Secondary | ICD-10-CM

## 2024-06-17 DIAGNOSIS — E109 Type 1 diabetes mellitus without complications: Secondary | ICD-10-CM | POA: Diagnosis present

## 2024-06-17 DIAGNOSIS — N189 Chronic kidney disease, unspecified: Secondary | ICD-10-CM

## 2024-06-17 DIAGNOSIS — K529 Noninfective gastroenteritis and colitis, unspecified: Secondary | ICD-10-CM | POA: Diagnosis present

## 2024-06-17 DIAGNOSIS — E271 Primary adrenocortical insufficiency: Secondary | ICD-10-CM | POA: Diagnosis present

## 2024-06-17 DIAGNOSIS — E785 Hyperlipidemia, unspecified: Secondary | ICD-10-CM | POA: Diagnosis present

## 2024-06-17 DIAGNOSIS — K2100 Gastro-esophageal reflux disease with esophagitis, without bleeding: Secondary | ICD-10-CM | POA: Diagnosis present

## 2024-06-17 DIAGNOSIS — N1832 Chronic kidney disease, stage 3b: Secondary | ICD-10-CM | POA: Diagnosis present

## 2024-06-17 DIAGNOSIS — D62 Acute posthemorrhagic anemia: Secondary | ICD-10-CM | POA: Diagnosis present

## 2024-06-17 DIAGNOSIS — E870 Hyperosmolality and hypernatremia: Secondary | ICD-10-CM | POA: Diagnosis present

## 2024-06-17 DIAGNOSIS — K3184 Gastroparesis: Secondary | ICD-10-CM | POA: Diagnosis present

## 2024-06-17 LAB — URINALYSIS, W/ REFLEX TO CULTURE (INFECTION SUSPECTED)
Bilirubin Urine: NEGATIVE
Glucose, UA: >=500 mg/dL — AB
Ketones, ur: NEGATIVE mg/dL
Nitrite: NEGATIVE
Protein, ur: 100 mg/dL — AB
Specific Gravity, Urine: 1.01 (ref 1.005–1.030)
pH: 5 (ref 5.0–8.0)

## 2024-06-17 LAB — COMPREHENSIVE METABOLIC PANEL WITH GFR
ALT: 11 U/L (ref 0–44)
AST: 32 U/L (ref 15–41)
Albumin: 3.5 g/dL (ref 3.5–5.0)
Alkaline Phosphatase: 59 U/L (ref 38–126)
Anion gap: 11 (ref 5–15)
BUN: 35 mg/dL — ABNORMAL HIGH (ref 6–20)
CO2: 21 mmol/L — ABNORMAL LOW (ref 22–32)
Calcium: 8.4 mg/dL — ABNORMAL LOW (ref 8.9–10.3)
Chloride: 112 mmol/L — ABNORMAL HIGH (ref 98–111)
Creatinine, Ser: 1.4 mg/dL — ABNORMAL HIGH (ref 0.44–1.00)
GFR, Estimated: 51 mL/min — ABNORMAL LOW
Glucose, Bld: 199 mg/dL — ABNORMAL HIGH (ref 70–99)
Potassium: 3.7 mmol/L (ref 3.5–5.1)
Sodium: 144 mmol/L (ref 135–145)
Total Bilirubin: 0.3 mg/dL (ref 0.0–1.2)
Total Protein: 5.9 g/dL — ABNORMAL LOW (ref 6.5–8.1)

## 2024-06-17 LAB — CBC WITH DIFFERENTIAL/PLATELET
Abs Immature Granulocytes: 0.01 10*3/uL (ref 0.00–0.07)
Basophils Absolute: 0 10*3/uL (ref 0.0–0.1)
Basophils Relative: 0 %
Eosinophils Absolute: 0 10*3/uL (ref 0.0–0.5)
Eosinophils Relative: 0 %
HCT: 25.4 % — ABNORMAL LOW (ref 36.0–46.0)
Hemoglobin: 8.4 g/dL — ABNORMAL LOW (ref 12.0–15.0)
Immature Granulocytes: 0 %
Lymphocytes Relative: 11 %
Lymphs Abs: 0.7 10*3/uL (ref 0.7–4.0)
MCH: 30.2 pg (ref 26.0–34.0)
MCHC: 33.1 g/dL (ref 30.0–36.0)
MCV: 91.4 fL (ref 80.0–100.0)
Monocytes Absolute: 0.3 10*3/uL (ref 0.1–1.0)
Monocytes Relative: 5 %
Neutro Abs: 4.8 10*3/uL (ref 1.7–7.7)
Neutrophils Relative %: 84 %
Platelets: 84 10*3/uL — ABNORMAL LOW (ref 150–400)
RBC: 2.78 MIL/uL — ABNORMAL LOW (ref 3.87–5.11)
RDW: 12.1 % (ref 11.5–15.5)
WBC: 5.8 10*3/uL (ref 4.0–10.5)
nRBC: 0 % (ref 0.0–0.2)

## 2024-06-17 LAB — URINE DRUG SCREEN
Amphetamines: NEGATIVE
Barbiturates: NEGATIVE
Benzodiazepines: NEGATIVE
Cocaine: NEGATIVE
Fentanyl: NEGATIVE
Methadone Scn, Ur: NEGATIVE
Opiates: NEGATIVE
Tetrahydrocannabinol: NEGATIVE

## 2024-06-17 LAB — BLOOD GAS, ARTERIAL
Acid-base deficit: 4 mmol/L — ABNORMAL HIGH (ref 0.0–2.0)
Bicarbonate: 19.6 mmol/L — ABNORMAL LOW (ref 20.0–28.0)
FIO2: 35 %
MECHVT: 400 mL
Mechanical Rate: 18
O2 Saturation: 98.3 %
PEEP: 5 cmH2O
Patient temperature: 37
pCO2 arterial: 31 mmHg — ABNORMAL LOW (ref 32–48)
pH, Arterial: 7.41 (ref 7.35–7.45)
pO2, Arterial: 89 mmHg (ref 83–108)

## 2024-06-17 LAB — RESPIRATORY PANEL BY PCR

## 2024-06-17 LAB — BASIC METABOLIC PANEL WITH GFR
Anion gap: 10 (ref 5–15)
BUN: 33 mg/dL — ABNORMAL HIGH (ref 6–20)
CO2: 21 mmol/L — ABNORMAL LOW (ref 22–32)
Calcium: 7.8 mg/dL — ABNORMAL LOW (ref 8.9–10.3)
Chloride: 110 mmol/L (ref 98–111)
Creatinine, Ser: 1.25 mg/dL — ABNORMAL HIGH (ref 0.44–1.00)
GFR, Estimated: 58 mL/min — ABNORMAL LOW
Glucose, Bld: 208 mg/dL — ABNORMAL HIGH (ref 70–99)
Potassium: 3.5 mmol/L (ref 3.5–5.1)
Sodium: 141 mmol/L (ref 135–145)

## 2024-06-17 LAB — CBG MONITORING, ED
Glucose-Capillary: 176 mg/dL — ABNORMAL HIGH (ref 70–99)
Glucose-Capillary: 39 mg/dL — CL (ref 70–99)
Glucose-Capillary: 150 mg/dL — ABNORMAL HIGH (ref 70–99)
Glucose-Capillary: 252 mg/dL — ABNORMAL HIGH (ref 70–99)
Glucose-Capillary: 146 mg/dL — ABNORMAL HIGH (ref 70–99)

## 2024-06-17 LAB — GLUCOSE, CAPILLARY
Glucose-Capillary: 156 mg/dL — ABNORMAL HIGH (ref 70–99)
Glucose-Capillary: 178 mg/dL — ABNORMAL HIGH (ref 70–99)
Glucose-Capillary: 182 mg/dL — ABNORMAL HIGH (ref 70–99)
Glucose-Capillary: 217 mg/dL — ABNORMAL HIGH (ref 70–99)
Glucose-Capillary: 272 mg/dL — ABNORMAL HIGH (ref 70–99)
Glucose-Capillary: 72 mg/dL (ref 70–99)

## 2024-06-17 LAB — LACTIC ACID, PLASMA
Lactic Acid, Venous: 2.1 mmol/L (ref 0.5–1.9)
Lactic Acid, Venous: 1.9 mmol/L (ref 0.5–1.9)

## 2024-06-17 LAB — MRSA NEXT GEN BY PCR, NASAL: MRSA by PCR Next Gen: NOT DETECTED

## 2024-06-17 LAB — TROPONIN T, HIGH SENSITIVITY: Troponin T High Sensitivity: 31 ng/L — ABNORMAL HIGH (ref 0–19)

## 2024-06-17 MED ORDER — ROSUVASTATIN CALCIUM 10 MG PO TABS
10.0000 mg | ORAL_TABLET | Freq: Every day | ORAL | Status: AC
  Administered 2024-06-18 – 2024-06-29 (×10): 10 mg via NASOGASTRIC
  Filled 2024-06-17 (×10): qty 1

## 2024-06-17 MED ORDER — SENNA 8.6 MG PO TABS
1.0000 | ORAL_TABLET | Freq: Two times a day (BID) | ORAL | Status: DC
  Administered 2024-06-18 – 2024-06-21 (×6): 8.6 mg
  Filled 2024-06-17 (×8): qty 1

## 2024-06-17 MED ORDER — DEXTROSE IN LACTATED RINGERS 5 % IV SOLN: INTRAVENOUS | Status: DC

## 2024-06-17 MED ORDER — FENTANYL BOLUS VIA INFUSION
25.0000 ug | INTRAVENOUS | Status: DC | PRN
  Administered 2024-06-18: 100 ug via INTRAVENOUS
  Administered 2024-06-18: 50 ug via INTRAVENOUS
  Administered 2024-06-18 (×2): 100 ug via INTRAVENOUS

## 2024-06-17 MED ORDER — ACETAMINOPHEN 500 MG PO TABS
1000.0000 mg | ORAL_TABLET | Freq: Four times a day (QID) | ORAL | Status: AC | PRN
  Administered 2024-06-27 – 2024-06-28 (×3): 1000 mg via NASOGASTRIC
  Filled 2024-06-17 (×3): qty 2

## 2024-06-17 MED ORDER — SENNA 8.6 MG PO TABS: 1.0000 | ORAL_TABLET | Freq: Two times a day (BID) | ORAL | Status: AC | PRN

## 2024-06-17 MED ORDER — ORAL CARE MOUTH RINSE
15.0000 mL | OROMUCOSAL | Status: DC
  Administered 2024-06-17 – 2024-06-24 (×82): 15 mL via OROMUCOSAL

## 2024-06-17 MED ORDER — HEPARIN SODIUM (PORCINE) 5000 UNIT/ML IJ SOLN
5000.0000 [IU] | Freq: Three times a day (TID) | INTRAMUSCULAR | Status: AC
  Administered 2024-06-17 – 2024-06-29 (×38): 5000 [IU] via SUBCUTANEOUS
  Filled 2024-06-17 (×39): qty 1

## 2024-06-17 MED ORDER — POLYETHYLENE GLYCOL 3350 17 G PO PACK
17.0000 g | PACK | Freq: Every day | ORAL | Status: DC
  Administered 2024-06-18 – 2024-06-19 (×2): 17 g
  Filled 2024-06-17 (×3): qty 1

## 2024-06-17 MED ORDER — POLYETHYLENE GLYCOL 3350 17 G PO PACK
17.0000 g | PACK | Freq: Every day | ORAL | Status: AC | PRN
  Administered 2024-06-27: 17 g
  Filled 2024-06-17: qty 1

## 2024-06-17 MED ORDER — LEVOTHYROXINE SODIUM 75 MCG PO TABS
75.0000 ug | ORAL_TABLET | Freq: Every day | ORAL | Status: AC
  Administered 2024-06-18 – 2024-06-29 (×11): 75 ug via NASOGASTRIC
  Filled 2024-06-17 (×4): qty 1
  Filled 2024-06-17: qty 3
  Filled 2024-06-17: qty 1
  Filled 2024-06-17: qty 3
  Filled 2024-06-17 (×5): qty 1
  Filled 2024-06-17: qty 3
  Filled 2024-06-17 (×3): qty 1

## 2024-06-17 MED ORDER — HYDROCORTISONE SOD SUC (PF) 100 MG IJ SOLR
100.0000 mg | Freq: Three times a day (TID) | INTRAMUSCULAR | Status: DC
  Administered 2024-06-17 – 2024-06-20 (×8): 100 mg via INTRAVENOUS
  Filled 2024-06-17 (×9): qty 2

## 2024-06-17 MED ORDER — FENTANYL CITRATE (PF) 50 MCG/ML IJ SOSY
25.0000 ug | PREFILLED_SYRINGE | Freq: Once | INTRAMUSCULAR | Status: AC
  Administered 2024-06-18: 50 ug via INTRAVENOUS
  Filled 2024-06-17: qty 1

## 2024-06-17 MED ORDER — NOREPINEPHRINE 4 MG/250ML-% IV SOLN
INTRAVENOUS | Status: AC
  Administered 2024-06-17: 4 mg
  Filled 2024-06-17: qty 250

## 2024-06-17 MED ORDER — CHLORHEXIDINE GLUCONATE CLOTH 2 % EX PADS
6.0000 | MEDICATED_PAD | Freq: Every day | CUTANEOUS | Status: DC
  Administered 2024-06-17 – 2024-06-18 (×2): 6 via TOPICAL
  Filled 2024-06-17: qty 6

## 2024-06-17 MED ORDER — DEXTROSE 50 % IV SOLN
1.0000 | Freq: Once | INTRAVENOUS | Status: AC
  Administered 2024-06-17: 50 mL via INTRAVENOUS
  Filled 2024-06-17: qty 50

## 2024-06-17 MED ORDER — ORAL CARE MOUTH RINSE: 15.0000 mL | OROMUCOSAL | Status: DC | PRN

## 2024-06-17 MED ORDER — FENTANYL 2500MCG IN NS 250ML (10MCG/ML) PREMIX INFUSION
0.0000 ug/h | INTRAVENOUS | Status: DC
  Administered 2024-06-18: 25 ug/h via INTRAVENOUS
  Filled 2024-06-17: qty 250

## 2024-06-17 MED ORDER — ROCURONIUM BROMIDE 10 MG/ML (PF) SYRINGE
PREFILLED_SYRINGE | INTRAVENOUS | Status: AC
  Filled 2024-06-17: qty 10

## 2024-06-17 MED ORDER — SODIUM CHLORIDE 0.9 % IV BOLUS: 1000.0000 mL | Freq: Once | INTRAVENOUS | Status: DC

## 2024-06-17 MED ORDER — LACTATED RINGERS IV SOLN: INTRAVENOUS | Status: DC

## 2024-06-17 MED ORDER — ETOMIDATE 2 MG/ML IV SOLN
INTRAVENOUS | Status: AC
  Filled 2024-06-17: qty 10

## 2024-06-17 MED ORDER — VANCOMYCIN HCL IN DEXTROSE 1-5 GM/200ML-% IV SOLN
1000.0000 mg | Freq: Once | INTRAVENOUS | Status: DC
  Administered 2024-06-17: 1000 mg via INTRAVENOUS
  Filled 2024-06-17: qty 200

## 2024-06-17 MED ORDER — PANTOPRAZOLE SODIUM 40 MG IV SOLR
40.0000 mg | INTRAVENOUS | Status: DC
  Administered 2024-06-17 – 2024-06-18 (×2): 40 mg via INTRAVENOUS
  Filled 2024-06-17 (×2): qty 10

## 2024-06-17 MED ORDER — ROCURONIUM BROMIDE 10 MG/ML (PF) SYRINGE
PREFILLED_SYRINGE | INTRAVENOUS | Status: AC | PRN
  Administered 2024-06-17: 70 mg via INTRAVENOUS

## 2024-06-17 MED ORDER — ALBUTEROL SULFATE (2.5 MG/3ML) 0.083% IN NEBU: 2.5000 mg | INHALATION_SOLUTION | Freq: Four times a day (QID) | RESPIRATORY_TRACT | Status: AC | PRN

## 2024-06-17 MED ORDER — DEXMEDETOMIDINE HCL IN NACL 400 MCG/100ML IV SOLN: 0.0000 ug/kg/h | INTRAVENOUS | Status: DC

## 2024-06-17 MED ORDER — SODIUM CHLORIDE 0.9 % IV SOLN
1.0000 g | INTRAVENOUS | Status: DC
  Administered 2024-06-17 – 2024-06-18 (×2): 1 g via INTRAVENOUS
  Filled 2024-06-17 (×2): qty 10

## 2024-06-17 MED ORDER — LACTATED RINGERS IV BOLUS
1000.0000 mL | Freq: Once | INTRAVENOUS | Status: AC
  Administered 2024-06-17: 1000 mL via INTRAVENOUS

## 2024-06-17 MED ORDER — ETOMIDATE 2 MG/ML IV SOLN
INTRAVENOUS | Status: AC | PRN
  Administered 2024-06-17: 20 mg via INTRAVENOUS

## 2024-06-17 MED ORDER — AZITHROMYCIN 250 MG PO TABS
500.0000 mg | ORAL_TABLET | Freq: Every day | ORAL | Status: AC
  Administered 2024-06-17 – 2024-06-22 (×6): 500 mg
  Filled 2024-06-17 (×6): qty 2

## 2024-06-17 NOTE — ED Notes (Signed)
 Time out preformed for emergency central line placement by DR Dgayli.

## 2024-06-17 NOTE — ED Notes (Signed)
 This RN called report to Corean, RN in ICU.

## 2024-06-17 NOTE — ED Triage Notes (Signed)
 BIBEMS from home. Pt found unconscious, friend at home unable to say how long pt had been unconscious for. Per friend, pt is fragile diabetic. Hx of type 1 diabetes. On arrival pt's CBG was 27, came up to 44 w/ IM Glucagon . On assessment pt very cold to touch and pale, L pupil 1 mm larger than R pupil.   EMS VS: 99 HR 133/68  39 End-Tidal 39 RR 87% SpO2 on RA and 3L

## 2024-06-17 NOTE — Progress Notes (Signed)
 PT TRANSPORTED TO & FROM CT ON SERVO AIR VENT WITH NO COMPLICATIONS

## 2024-06-17 NOTE — Procedures (Signed)
 Central Venous Catheter Insertion Procedure Note  Ashley Camacho  [id number]  Sep 11, 1990  Date:06/17/24  Time:4:01 PM   Provider Performing:Nima Bamburg   Procedure: Insertion of Non-tunneled Central Venous Catheter(36556) with US  guidance (23062)   Indication(s) Medication administration and Difficult access  Consent Unable to obtain consent due to emergent nature of procedure.  Anesthesia Topical only with 1% lidocaine    Timeout Verified patient identification, verified procedure, site/side was marked, verified correct patient position, special equipment/implants available, medications/allergies/relevant history reviewed, required imaging and test results available.  Sterile Technique Maximal sterile technique including full sterile barrier drape, hand hygiene, sterile gown, sterile gloves, mask, hair covering, sterile ultrasound probe cover (if used).  Procedure Description Area of catheter insertion was cleaned with chlorhexidine  and draped in sterile fashion.  With real-time ultrasound guidance a central venous catheter was placed into the right internal jugular vein. Nonpulsatile blood flow and easy flushing noted in all ports.  The catheter was sutured in place and sterile dressing applied.    Complications/Tolerance None; patient tolerated the procedure well. Chest X-ray is ordered to verify placement for internal jugular or subclavian cannulation.   Chest x-ray is not ordered for femoral cannulation.  EBL Minimal  Specimen(s) None  Ashley November, MD [facility] 06/17/2024 4:02 PM

## 2024-06-17 NOTE — H&P (Addendum)
 "  NAME:  Ashley Camacho, MRN:  [id number], DOB:  [birth year], LOS: 0 ADMISSION DATE:  06/17/2024 Admission DATE:  06/17/24 REFERRING MD:  Dr Floy, CHIEF COMPLAINT:  Unconscious    BRIEF SYNOPSIS: 34 Y/O female admitted with severe hypoglycemia, loss of consciousness and acute hypoxic respiratory failure  History of Present Illness:  34 y/o female with a medical history as indicated below whop presented to the ED via EMS after being found unresponsive. History is obtained from ED records, EMS records and from speaking with patient's mother. Per EMS records, patient was found unresponsive by her friend. Total downtime is uncertain. Blood glucose with EMS was 27 mg/dl. She was given an amp of glucagon  and blood glucose improved to 44 mg/dl.   Upon arrival in the ED, patient was still unresponsive, hypothermic, hypoxic on 3L [place] and pupils were dilated. An I/O was placed in her right leg. She given an amp of D50. Glucose improved to 252 mg/dl. Despite increase in blood glucose, she remained unresponsive and hypoxic hence she was emergently intubated. PCCM consulted for further management.  ED work-up: CT head negative. Chest x-ray shows left lower lobe atelectasis. Labs pending  Of note, patient was diagnosed with influenza A on 05/27/24 and was hospitalized from 1/4-05/30/2024. Per patient's mother, she is a very fragile diabetic. She states that she has been buying groceries for patient because she is not able to afford food. Patient lives with friends. Unfortunately she does not have the contact information for patient's friend who called EMS or the friends with whom she lives.  Past Medical History:  Diagnosis Date   Acute metabolic encephalopathy 06/10/2022   Adrenal insufficiency    Anemia    Anxiety    Blood transfusion without reported diagnosis    CHF (congestive heart failure) (HCC)    Chronic kidney disease    Depression    Gastroparesis    Hypertension    Hypotension    Intractable  nausea and vomiting 11/13/2022   Migraine    Thyroid  disease    Type 1 diabetes [facility])    Past Surgical History:  Procedure Laterality Date   BIOPSY  01/14/2021   Procedure: BIOPSY;  Surgeon: Eda Iha, MD;  Location: [facility] ENDOSCOPY;  Service: Gastroenterology;;   CENTRAL LINE INSERTION N/A 10/10/2023   Procedure: CENTRAL LINE INSERTION;  Surgeon: Marea Selinda RAMAN, MD;  Location: [facility] INVASIVE CV LAB;  Service: Cardiovascular;  Laterality: N/A;   CESAREAN SECTION     x2   CESAREAN SECTION WITH BILATERAL TUBAL LIGATION N/A 02/17/2021   Procedure: CESAREAN SECTION WITH BILATERAL TUBAL LIGATION;  Surgeon: Barbra Lang PARAS, DO;  Location: [facility] LD ORS;  Service: Obstetrics;  Laterality: N/A;   ESOPHAGOGASTRODUODENOSCOPY (EGD) WITH PROPOFOL  N/A 01/14/2021   Procedure: ESOPHAGOGASTRODUODENOSCOPY (EGD) WITH PROPOFOL ;  Surgeon: Eda Iha, MD;  Location: [facility] ENDOSCOPY;  Service: Gastroenterology;  Laterality: N/A;   IR BONE MARROW BIOPSY & ASPIRATION  12/16/2022   IR REPLC DUODEN/JEJUNO TUBE PERCUT W/FLUORO  11/22/2022   JEJUNOSTOMY N/A 06/13/2022   Procedure: DEXTER;  Surgeon: Jordis Laneta FALCON, MD;  Location: [facility] ORS;  Service: General;  Laterality: N/A;   MOUTH SURGERY     TEE WITHOUT CARDIOVERSION N/A 01/06/2024   Procedure: ECHOCARDIOGRAM, TRANSESOPHAGEAL;  Surgeon: Rolan Ezra RAMAN, MD;  Location: [facility] ORS;  Service: Cardiovascular;  Laterality: N/A;   TUBAL LIGATION     Allergies Allergies[1]  Family History Family History  Problem Relation Age of Onset   Breast cancer Mother  Seizures Mother    Cancer Mother    Diabetes type I Father    CAD Father    Diabetes Father    Early death Father    Lung cancer Maternal Grandfather    CAD Paternal Grandmother    Diabetes Paternal Grandmother    CAD Paternal Grandfather    Diabetes Paternal Grandfather    ADD / ADHD Daughter    Diabetes Maternal Uncle    Ovarian cancer Neg Hx    Social History  reports that she has  never smoked. She has never been exposed to tobacco smoke. She has never used smokeless tobacco. She reports that she does not drink alcohol and does not use drugs.  Review Of Systems:  Unable to obtain as patient is currently intubated and sedated.   VITAL SIGNS: BP 109/84   Pulse (!) 103   Resp 18   SpO2 100%   HEMODYNAMICS:   VENTILATOR SETTINGS: Vent Mode: PRVC FiO2 (%):  [35 %] 35 % Set Rate:  [18 bmp] 18 bmp Vt Set:  [400 mL] 400 mL PEEP:  [5 cmH20] 5 cmH20  INTAKE / OUTPUT: No intake/output data recorded.  PHYSICAL EXAMINATION: General: Acutely ill looking, disheveled, intubated and sedated HEENT: Normocephalic, atraumatic, ET tube in place, pupils dilated left greater than right, sluggish corneal reflexes, trachea midline, no JVD Neuro: Unresponsive to voice and noxious stimulus, Anisocoria with left pupil more dilated than the right, no gag reflex Cardiovascular: Apical pulse tachycardic, regular, S1-S2, no murmur regurg or gallop, lower extremities without edema. Lungs: Synchronous with the vent, ET tube in place, bilateral lung expansion, breath sounds bilaterally without any wheezes or rhonchi Abdomen: Nondistended, normal bowel sounds in all 4 quadrants, tympanic on percussion around the epigastrium, no organomegaly on palpation Musculoskeletal: Positive range of motion in upper and lower extremities, no joint deformities.  IO device in right patient. Skin: Skin is warm and dry, small bruise on left right knee.  LABS:  BMET Recent Labs  Lab 06/17/24 1600  NA 144  K 3.7  CL 112*  CO2 21*  BUN 35*  CREATININE 1.40*  GLUCOSE 199*    Electrolytes Recent Labs  Lab 06/17/24 1600  CALCIUM  8.4*    CBC Recent Labs  Lab 06/13/24 1606 06/17/24 1600  WBC 8.0 5.8  HGB 8.1* 8.4*  HCT 25.0* 25.4*  PLT 142* 84*    Coag's No results for input(s): APTT, INR in the last 168 hours.  Sepsis Markers Recent Labs  Lab 06/17/24 1600  LATICACIDVEN 2.1*     ABG Recent Labs  Lab 06/17/24 1544  PHART 7.41  PCO2ART 31*  PO2ART 89    Liver Enzymes Recent Labs  Lab 06/17/24 1600  AST 32  ALT 11  ALKPHOS 59  BILITOT 0.3  ALBUMIN  3.5    Cardiac Enzymes No results for input(s): TROPONINI, PROBNP in the last 168 hours.  Glucose Recent Labs  Lab 06/17/24 1409 06/17/24 1420 06/17/24 1430 06/17/24 1459 06/17/24 1639  GLUCAP 39* 176* 150* 252* 146*    Imaging DG Chest Portable 1 View Result Date: 06/17/2024 CLINICAL DATA:  Central line placement. EXAM: PORTABLE CHEST 1 VIEW COMPARISON:  None Available. FINDINGS: The heart size and mediastinal contours are within normal limits. 06/17/2024. No consolidation, effusion, or pneumothorax is seen. A right internal jugular central venous catheter terminates in the cavoatrial junction. An endotracheal tube terminates 1.8 cm above the carina. An enteric tube is seen in the stomach. IMPRESSION: 1. No active disease. 2.  Support apparatus as described above. Electronically Signed   By: Leita Birmingham M.D.   On: 06/17/2024 16:20   CT Head Wo Contrast Result Date: 06/17/2024 EXAM: CT HEAD WITHOUT CONTRAST 06/17/2024 03:15:00 PM TECHNIQUE: CT of the head was performed without the administration of intravenous contrast. Automated exposure control, iterative reconstruction, and/or weight based adjustment of the mA/kV was utilized to reduce the radiation dose to as low as reasonably achievable. COMPARISON: MRI head 08/27/2023. CLINICAL HISTORY: Altered mental status. FINDINGS: BRAIN AND VENTRICLES: No acute hemorrhage. No evidence of acute infarct. No hydrocephalus. No extra-axial collection. No mass effect or midline shift. ORBITS: No acute abnormality. SINUSES: Mucosal thickening in the partially visualized left maxillary sinus with areas of layering secretions scattered mucosal thickening in the bilateral ethmoid sinuses. SOFT TISSUES AND SKULL: No acute soft tissue abnormality. No skull fracture.  IMPRESSION: 1. No acute intracranial abnormality. 2. Left maxillary and bilateral ethmoid sinus mucosal thickening with layering secretions, which can be seen with acute sinusitis. Electronically signed by: Donnice Mania MD 06/17/2024 03:28 PM EST RP Workstation: [id number]   DG Chest Portable 1 View Result Date: 06/17/2024 EXAM: 1 VIEW(S) XRAY OF THE CHEST 06/17/2024 02:52:57 PM COMPARISON: 05/27/2024 CLINICAL HISTORY: Intubation placement confirmation. FINDINGS: LINES, TUBES AND DEVICES: Endotracheal tube in place with tip 2.8 cm above the carina. Enteric tube in place with tip off the field of view, below the left hemidiaphragm. LUNGS AND PLEURA: Lungs are clear.  No pleural effusion. No pneumothorax. HEART AND MEDIASTINUM: No acute abnormality of the cardiac and mediastinal silhouettes. BONES AND SOFT TISSUES: No acute osseous abnormality. Gaseous distention of the stomach. IMPRESSION: 1. Endotracheal tube in appropriate position. 2. Enteric tube in appropriate position. 3. Gaseous distention of the stomach. 4. Linear atelectasis in the left mid lung. Electronically signed by: Elsie Gravely MD 06/17/2024 03:15 PM EST RP Workstation: [id number]   STUDIES:  Last echo 05/28/24 with EF of 40-45%  CULTURES: Blood cultures x 2> Urine cultures> Sputum culture>  ANTIBIOTICS: Azithromycin > Ceftriaxone > Vancomycin >  SIGNIFICANT EVENTS: 06/17/24: Admitted with severe hypoglycemia and acute hypoxic respiratory failure  LINES/TUBES: Rt internal jugular 1/25> Foley 1/25>  ASSESSMENT / PLAN:  PULMONARY A: Acute hypoxic respiratory failure Acute sinusitis Present history of influenza A infection HCAP Lactic acidosis P:   -ABG postintubation reviewed. -Full vent support with current settings -Chest x-ray postintubation reviewed-ET tube appropriately placed. - Chest x-ray and ABG as needed -Nebulized bronchodilators -Wake up and weaning assessments daily - Broad-spectrum antibiotic -Will  repeat respiratory viral panel -Trend lactic acid  CARDIOVASCULAR A:  Sinus tachycardia Shock: Due to a combination of hypovolemic and septic shock H/O heart failure with mildly reduced EF, hypertension: Last 2D echo showed an EF of 40 to 45% P:  -Gentle fluid resuscitation with LR 1 L bolus x 1 now and relabeling at 75 mL/h - Hemodynamic monitoring per ICU protocol - Vasopressors as needed to maintain mean arterial blood pressure greater than 65  RENAL A:   Acute on chronic renal failure stage IIIB-creatinine 1.4; this is improved from a level of 2.05 during her last hospitalization P:   -Continue to monitor renal indicis - Avoid nephrotoxic drugs - Continue gentle fluid resuscitation - Strict I's and O's monitoring - Insert Foley catheter/Foley care per protocol -Monitor and correct electrolytes  GASTROINTESTINAL A:   Gaseous distension on X-ray P:   -NG tube to low intermittent suctioning -Protonix  for GI prophylaxis  HEMATOLOGIC A:   Anemia of chronic disease-hemoglobin 8.4 g/dL; this is her  baseline P:  -Trend hemoglobin and hematocrit and transfuse for hemoglobin less than 7  INFECTIOUS A:   Recent history of influenza A infection HCAP Sepsis secondary to pneumonia P:   - On vancomycin , ceftriaxone  and azithromycin  -Will repeat respiratory panel - Follow-up blood and urine and sputum culture - Broad-spectrum antibiotics; review and adjust once cultures are available - MRSA screen pending  ENDOCRINE A:   Severe hypoglycemia-likely due to a combination of nonadherence and food insecurity.  Per patient's mother she has never expressed any suicidal ideation. Uncontrolled Type 1 DM  History of adrenal insufficiency, patient was on p.o. hydrocortisone  but it is unclear if she has been taking it as prescribed P:   -Monitor and treat hypoglycemia - Consult to diabetes educator prior to discharge - Will start Solu-Cortef  100 mg every 8 - TSH and cortisol level  in a.m.  NEUROLOGIC A:   Loss of consciousness: Exact downtime unknown;  initially thought to be due to hypoglycemia but her mental status did not change with improved blood glucose; CT head negative for acute bleed or infarct. P:   RASS goal: 0 to 1 -Continue to monitor neurological status - Treat underlying metabolic abnormalities -Not currently requiring any sedation.   -Will consult neurology and defer further imaging/testing to neurology. Spoke with Dr. Germaine and she recommended placing the patient on the [place].  Best Practice: Diet/type: NPO VTE prophylaxis:  SCD's /SQ heparin  GI prophylaxis: Protonix  Lines: Right IJ Foley: In place admitted Code Status: Full code Last date of multidisciplinary goals of care discussion: Spoke with patient's mother.  Updated on patient's current status as well as obtained history of events preceding this hospitalization.  All questions answered.  Support provided.  ICU contact number provided.  Nelton Amsden S. Griffin, PhD, MSN, MPH, ANP-BC [facility] Prefer epic messenger for cross cover needs If after hours, please call E-link  NB: This document was prepared using Dragon voice recognition software and may include unintentional dictation errors.    06/17/2024, 5:04 PM             [1] No Known Allergies  "

## 2024-06-17 NOTE — ED Provider Notes (Signed)
 "  [facility] Provider Note    Event Date/Time   First MD Initiated Contact with Patient 06/17/24 1410     (approximate)   History   Hypoglycemia   HPI  Ashley Camacho is a 34 y.o. female who presents to the emergency department via EMS.  EMS was called to the patient's residence after the patient was found unresponsive.  It is unclear how long the patient might have been unresponsive.  Patient is a known diabetic and when EMS checked her sugar was 27.  They did give her IM glucagon .  Patient is unable to give any history upon arrival to the emergency department.   Physical Exam   Triage Vital Signs: ED Triage Vitals  Encounter Vitals Group     BP 06/17/24 1429 (!) 160/96     Girls Systolic BP Percentile --      Girls Diastolic BP Percentile --      Boys Systolic BP Percentile --      Boys Diastolic BP Percentile --      Pulse Rate 06/17/24 1431 (!) 113     Resp --      Temp --      Temp src --      SpO2 --      Weight --      Height --      Head Circumference --      Peak Flow --      Pain Score --      Pain Loc --      Pain Education --      Exclude from Growth Chart --     Most recent vital signs: Vitals:   06/17/24 1431 06/17/24 1432  BP: 139/75 139/75  Pulse: (!) 113 (!) 108   General: Minimally responsive to pain.  CV:  Tachycardia, cold extremities. Resp:  Normal effort. Lungs clear. Abd:  No distention. Soft. Other:  Minimally responsive to pain. Pupils dilated, sluggishly responsive.    ED Results / Procedures / Treatments   Labs (all labs ordered are listed, but only abnormal results are displayed) Labs Reviewed  CBC WITH DIFFERENTIAL/PLATELET - Abnormal; Notable for the following components:      Result Value   RBC 2.78 (*)    Hemoglobin 8.4 (*)    HCT 25.4 (*)    Platelets 84 (*)    All other components within normal limits  COMPREHENSIVE METABOLIC PANEL WITH GFR - Abnormal; Notable for the following components:    Chloride 112 (*)    CO2 21 (*)    Glucose, Bld 199 (*)    BUN 35 (*)    Creatinine, Ser 1.40 (*)    Calcium  8.4 (*)    Total Protein 5.9 (*)    GFR, Estimated 51 (*)    All other components within normal limits  LACTIC ACID, PLASMA - Abnormal; Notable for the following components:   Lactic Acid, Venous 2.1 (*)    All other components within normal limits  URINALYSIS, W/ REFLEX TO CULTURE (INFECTION SUSPECTED) - Abnormal; Notable for the following components:   Color, Urine YELLOW (*)    APPearance HAZY (*)    Glucose, UA >=500 (*)    Hgb urine dipstick SMALL (*)    Protein, ur 100 (*)    Leukocytes,Ua SMALL (*)    Bacteria, UA MANY (*)    All other components within normal limits  BLOOD GAS, ARTERIAL - Abnormal; Notable for the following components:  pCO2 arterial 31 (*)    Bicarbonate 19.6 (*)    Acid-base deficit 4.0 (*)    All other components within normal limits  CBG MONITORING, ED - Abnormal; Notable for the following components:   Glucose-Capillary 39 (*)    All other components within normal limits  CBG MONITORING, ED - Abnormal; Notable for the following components:   Glucose-Capillary 176 (*)    All other components within normal limits  CBG MONITORING, ED - Abnormal; Notable for the following components:   Glucose-Capillary 150 (*)    All other components within normal limits  CBG MONITORING, ED - Abnormal; Notable for the following components:   Glucose-Capillary 252 (*)    All other components within normal limits  CBG MONITORING, ED - Abnormal; Notable for the following components:   Glucose-Capillary 146 (*)    All other components within normal limits  MRSA NEXT GEN BY PCR, NASAL  CULTURE, BLOOD (ROUTINE X 2)  CULTURE, BLOOD (ROUTINE X 2)  CULTURE, RESPIRATORY W GRAM STAIN  URINE CULTURE  RESPIRATORY PANEL BY PCR  URINE DRUG SCREEN  GLUCOSE, CAPILLARY  CBC  BASIC METABOLIC PANEL WITH GFR  BLOOD GAS, ARTERIAL  MAGNESIUM   PHOSPHORUS  TSH   CORTISOL-AM, BLOOD  LACTIC ACID, PLASMA  BASIC METABOLIC PANEL WITH GFR  TROPONIN T, HIGH SENSITIVITY     EKG  None    RADIOLOGY I independently interpreted and visualized the CT head. My interpretation: No ICH Radiology interpretation:  IMPRESSION:  1. No acute intracranial abnormality.  2. Left maxillary and bilateral ethmoid sinus mucosal thickening with layering  secretions, which can be seen with acute sinusitis.    I independently interpreted and visualized the CXR. My interpretation: ET tube in appropriate position. No pneumonia. Radiology interpretation:  IMPRESSION:  1. Endotracheal tube in appropriate position.  2. Enteric tube in appropriate position.  3. Gaseous distention of the stomach.  4. Linear atelectasis in the left mid lung.    PROCEDURES:  Critical Care performed: Yes  CRITICAL CARE Performed by: Guadalupe Eagles   Total critical care time: 35 minutes  Critical care time was exclusive of separately billable procedures and treating other patients.  Critical care was necessary to treat or prevent imminent or life-threatening deterioration.  Critical care was time spent personally by me on the following activities: development of treatment plan with patient and/or surrogate as well as nursing, discussions with consultants, evaluation of patient's response to treatment, examination of patient, obtaining history from patient or surrogate, ordering and performing treatments and interventions, ordering and review of laboratory studies, ordering and review of radiographic studies, pulse oximetry and re-evaluation of patient's condition.   Procedures  INTUBATION Performed by: Guadalupe Eagles  Required items: required blood products, implants, devices, and special equipment available Patient identity confirmed: provided demographic data and hospital-assigned identification number Time out: Immediately prior to procedure a time out was called to  verify the correct patient, procedure, equipment, support staff and site/side marked as required.  Indications: Airway protection, decreased responsivness  Intubation method: Glidescope Laryngoscopy   Preoxygenation: BVM  Sedatives: Etomidate  Paralytic: Rocuronium   Tube Size: 7.5 cuffed  Post-procedure assessment: chest rise and ETCO2 monitor Breath sounds: equal and absent over the epigastrium Tube secured with: ETT holder Chest x-ray interpreted by radiologist and me.  Chest x-ray findings: endotracheal tube in appropriate position  Patient tolerated the procedure well with no immediate complications.    MEDICATIONS ORDERED IN ED: Medications  etomidate  (AMIDATE ) injection ( Intravenous Canceled Entry 06/17/24  1430)  rocuronium  (ZEMURON ) injection (70 mg Intravenous Given 06/17/24 1430)     IMPRESSION / MDM / ASSESSMENT AND PLAN / ED COURSE  I reviewed the triage vital signs and the nursing notes.                              Differential diagnosis includes, but is not limited to, ICH, hypoglycemia, infection, anemia, electrolyte abnormality  Patient's presentation is most consistent with acute presentation with potential threat to life or bodily function.   The patient is on the cardiac monitor to evaluate for evidence of arrhythmia and/or significant heart rate changes.  Patient presented to the emergency department today because of concerns for decreased responsiveness.  Was found to be hypoglycemic by EMS on arrival.  Had been given glucagon  in the field.  On arrival to the emergency department patient is minimally responsive to painful stimuli.  She will occasionally wake up to moan and then becomes unresponsive once again.  There was significant difficulty obtaining peripheral access.  An IO was placed and drugs were given through the IO.  She was given D50 as well as started on a D10 drip.  Given patient's significant altered mental status and evidence of vomiting  around her mouth the decision was made to intubate for airway protection.  Patient tolerated intubation well.  Patient's blood sugar did improve after dextrose .  Discussed with ICU attending who will admit and place central line.      FINAL CLINICAL IMPRESSION(S) / ED DIAGNOSES   Final diagnoses:  Hypoglycemia  Altered mental status, unspecified altered mental status type       Note:  This document was prepared using Dragon voice recognition software and may include unintentional dictation errors.    Floy Roberts, MD 06/17/24 1916  "

## 2024-06-17 NOTE — ED Notes (Signed)
 Advised nurse that patient has ready bed

## 2024-06-18 ENCOUNTER — Inpatient Hospital Stay

## 2024-06-18 DIAGNOSIS — E1011 Type 1 diabetes mellitus with ketoacidosis with coma: Secondary | ICD-10-CM | POA: Diagnosis not present

## 2024-06-18 DIAGNOSIS — I5032 Chronic diastolic (congestive) heart failure: Secondary | ICD-10-CM

## 2024-06-18 DIAGNOSIS — R569 Unspecified convulsions: Secondary | ICD-10-CM

## 2024-06-18 DIAGNOSIS — J9601 Acute respiratory failure with hypoxia: Secondary | ICD-10-CM | POA: Diagnosis not present

## 2024-06-18 DIAGNOSIS — N170 Acute kidney failure with tubular necrosis: Secondary | ICD-10-CM

## 2024-06-18 DIAGNOSIS — E10649 Type 1 diabetes mellitus with hypoglycemia without coma: Secondary | ICD-10-CM | POA: Diagnosis not present

## 2024-06-18 DIAGNOSIS — G928 Other toxic encephalopathy: Secondary | ICD-10-CM | POA: Diagnosis not present

## 2024-06-18 DIAGNOSIS — N189 Chronic kidney disease, unspecified: Secondary | ICD-10-CM | POA: Diagnosis not present

## 2024-06-18 DIAGNOSIS — R4182 Altered mental status, unspecified: Secondary | ICD-10-CM | POA: Diagnosis not present

## 2024-06-18 DIAGNOSIS — E1043 Type 1 diabetes mellitus with diabetic autonomic (poly)neuropathy: Secondary | ICD-10-CM

## 2024-06-18 DIAGNOSIS — E2740 Unspecified adrenocortical insufficiency: Secondary | ICD-10-CM

## 2024-06-18 DIAGNOSIS — E1022 Type 1 diabetes mellitus with diabetic chronic kidney disease: Secondary | ICD-10-CM | POA: Diagnosis not present

## 2024-06-18 DIAGNOSIS — E039 Hypothyroidism, unspecified: Secondary | ICD-10-CM

## 2024-06-18 DIAGNOSIS — I130 Hypertensive heart and chronic kidney disease with heart failure and stage 1 through stage 4 chronic kidney disease, or unspecified chronic kidney disease: Secondary | ICD-10-CM | POA: Diagnosis not present

## 2024-06-18 LAB — BLOOD GAS, ARTERIAL
Acid-base deficit: 15.5 mmol/L — ABNORMAL HIGH (ref 0.0–2.0)
Bicarbonate: 10.8 mmol/L — ABNORMAL LOW (ref 20.0–28.0)
FIO2: 35 %
MECHVT: 400 mL
Mechanical Rate: 18
O2 Saturation: 99.4 %
PEEP: 5 cmH2O
Patient temperature: 37
pCO2 arterial: 27 mmHg — ABNORMAL LOW (ref 32–48)
pH, Arterial: 7.21 — ABNORMAL LOW (ref 7.35–7.45)
pO2, Arterial: 153 mmHg — ABNORMAL HIGH (ref 83–108)

## 2024-06-18 LAB — TROPONIN T, HIGH SENSITIVITY: Troponin T High Sensitivity: 32 ng/L — ABNORMAL HIGH (ref 0–19)

## 2024-06-18 LAB — BASIC METABOLIC PANEL WITH GFR
Anion gap: 22 — ABNORMAL HIGH (ref 5–15)
Anion gap: 24 — ABNORMAL HIGH (ref 5–15)
Anion gap: 20 — ABNORMAL HIGH (ref 5–15)
Anion gap: 11 (ref 5–15)
Anion gap: 9 (ref 5–15)
Anion gap: 11 (ref 5–15)
BUN: 38 mg/dL — ABNORMAL HIGH (ref 6–20)
BUN: 39 mg/dL — ABNORMAL HIGH (ref 6–20)
BUN: 38 mg/dL — ABNORMAL HIGH (ref 6–20)
BUN: 37 mg/dL — ABNORMAL HIGH (ref 6–20)
BUN: 36 mg/dL — ABNORMAL HIGH (ref 6–20)
BUN: 36 mg/dL — ABNORMAL HIGH (ref 6–20)
CO2: 12 mmol/L — ABNORMAL LOW (ref 22–32)
CO2: 10 mmol/L — ABNORMAL LOW (ref 22–32)
CO2: 12 mmol/L — ABNORMAL LOW (ref 22–32)
CO2: 18 mmol/L — ABNORMAL LOW (ref 22–32)
CO2: 20 mmol/L — ABNORMAL LOW (ref 22–32)
CO2: 19 mmol/L — ABNORMAL LOW (ref 22–32)
Calcium: 8.1 mg/dL — ABNORMAL LOW (ref 8.9–10.3)
Calcium: 7.7 mg/dL — ABNORMAL LOW (ref 8.9–10.3)
Calcium: 7.9 mg/dL — ABNORMAL LOW (ref 8.9–10.3)
Calcium: 7.8 mg/dL — ABNORMAL LOW (ref 8.9–10.3)
Calcium: 8.2 mg/dL — ABNORMAL LOW (ref 8.9–10.3)
Calcium: 7.7 mg/dL — ABNORMAL LOW (ref 8.9–10.3)
Chloride: 103 mmol/L (ref 98–111)
Chloride: 104 mmol/L (ref 98–111)
Chloride: 105 mmol/L (ref 98–111)
Chloride: 109 mmol/L (ref 98–111)
Chloride: 109 mmol/L (ref 98–111)
Chloride: 107 mmol/L (ref 98–111)
Creatinine, Ser: 1.93 mg/dL — ABNORMAL HIGH (ref 0.44–1.00)
Creatinine, Ser: 2.05 mg/dL — ABNORMAL HIGH (ref 0.44–1.00)
Creatinine, Ser: 2.1 mg/dL — ABNORMAL HIGH (ref 0.44–1.00)
Creatinine, Ser: 2 mg/dL — ABNORMAL HIGH (ref 0.44–1.00)
Creatinine, Ser: 2.02 mg/dL — ABNORMAL HIGH (ref 0.44–1.00)
Creatinine, Ser: 1.98 mg/dL — ABNORMAL HIGH (ref 0.44–1.00)
GFR, Estimated: 34 mL/min — ABNORMAL LOW
GFR, Estimated: 32 mL/min — ABNORMAL LOW
GFR, Estimated: 31 mL/min — ABNORMAL LOW
GFR, Estimated: 33 mL/min — ABNORMAL LOW
GFR, Estimated: 33 mL/min — ABNORMAL LOW
GFR, Estimated: 33 mL/min — ABNORMAL LOW
Glucose, Bld: 443 mg/dL — ABNORMAL HIGH (ref 70–99)
Glucose, Bld: 430 mg/dL — ABNORMAL HIGH (ref 70–99)
Glucose, Bld: 371 mg/dL — ABNORMAL HIGH (ref 70–99)
Glucose, Bld: 154 mg/dL — ABNORMAL HIGH (ref 70–99)
Glucose, Bld: 130 mg/dL — ABNORMAL HIGH (ref 70–99)
Glucose, Bld: 205 mg/dL — ABNORMAL HIGH (ref 70–99)
Potassium: 7.1 mmol/L (ref 3.5–5.1)
Potassium: 5.9 mmol/L — ABNORMAL HIGH (ref 3.5–5.1)
Potassium: 5.4 mmol/L — ABNORMAL HIGH (ref 3.5–5.1)
Potassium: 4.9 mmol/L (ref 3.5–5.1)
Potassium: 5.1 mmol/L (ref 3.5–5.1)
Potassium: 5.3 mmol/L — ABNORMAL HIGH (ref 3.5–5.1)
Sodium: 136 mmol/L (ref 135–145)
Sodium: 138 mmol/L (ref 135–145)
Sodium: 137 mmol/L (ref 135–145)
Sodium: 139 mmol/L (ref 135–145)
Sodium: 138 mmol/L (ref 135–145)
Sodium: 136 mmol/L (ref 135–145)

## 2024-06-18 LAB — GLUCOSE, CAPILLARY
Glucose-Capillary: 113 mg/dL — ABNORMAL HIGH (ref 70–99)
Glucose-Capillary: 116 mg/dL — ABNORMAL HIGH (ref 70–99)
Glucose-Capillary: 120 mg/dL — ABNORMAL HIGH (ref 70–99)
Glucose-Capillary: 123 mg/dL — ABNORMAL HIGH (ref 70–99)
Glucose-Capillary: 127 mg/dL — ABNORMAL HIGH (ref 70–99)
Glucose-Capillary: 135 mg/dL — ABNORMAL HIGH (ref 70–99)
Glucose-Capillary: 168 mg/dL — ABNORMAL HIGH (ref 70–99)
Glucose-Capillary: 169 mg/dL — ABNORMAL HIGH (ref 70–99)
Glucose-Capillary: 171 mg/dL — ABNORMAL HIGH (ref 70–99)
Glucose-Capillary: 174 mg/dL — ABNORMAL HIGH (ref 70–99)
Glucose-Capillary: 190 mg/dL — ABNORMAL HIGH (ref 70–99)
Glucose-Capillary: 192 mg/dL — ABNORMAL HIGH (ref 70–99)
Glucose-Capillary: 192 mg/dL — ABNORMAL HIGH (ref 70–99)
Glucose-Capillary: 207 mg/dL — ABNORMAL HIGH (ref 70–99)
Glucose-Capillary: 219 mg/dL — ABNORMAL HIGH (ref 70–99)
Glucose-Capillary: 308 mg/dL — ABNORMAL HIGH (ref 70–99)
Glucose-Capillary: 309 mg/dL — ABNORMAL HIGH (ref 70–99)
Glucose-Capillary: 334 mg/dL — ABNORMAL HIGH (ref 70–99)
Glucose-Capillary: 341 mg/dL — ABNORMAL HIGH (ref 70–99)
Glucose-Capillary: 344 mg/dL — ABNORMAL HIGH (ref 70–99)
Glucose-Capillary: 346 mg/dL — ABNORMAL HIGH (ref 70–99)
Glucose-Capillary: 399 mg/dL — ABNORMAL HIGH (ref 70–99)

## 2024-06-18 LAB — LACTIC ACID, PLASMA: Lactic Acid, Venous: 1.4 mmol/L (ref 0.5–1.9)

## 2024-06-18 LAB — BETA-HYDROXYBUTYRIC ACID
Beta-Hydroxybutyric Acid: 5.22 mmol/L — ABNORMAL HIGH (ref 0.05–0.27)
Beta-Hydroxybutyric Acid: 4.42 mmol/L — ABNORMAL HIGH (ref 0.05–0.27)
Beta-Hydroxybutyric Acid: 0.24 mmol/L (ref 0.05–0.27)
Beta-Hydroxybutyric Acid: 0.07 mmol/L (ref 0.05–0.27)

## 2024-06-18 LAB — BLOOD GAS, VENOUS
Acid-base deficit: 8.3 mmol/L — ABNORMAL HIGH (ref 0.0–2.0)
Bicarbonate: 16.9 mmol/L — ABNORMAL LOW (ref 20.0–28.0)
O2 Saturation: 91.8 %
Patient temperature: 37
pCO2, Ven: 32 mmHg — ABNORMAL LOW (ref 44–60)
pH, Ven: 7.33 (ref 7.25–7.43)
pO2, Ven: 48 mmHg — ABNORMAL HIGH (ref 32–45)

## 2024-06-18 LAB — CBC
HCT: 25.4 % — ABNORMAL LOW (ref 36.0–46.0)
Hemoglobin: 8.1 g/dL — ABNORMAL LOW (ref 12.0–15.0)
MCH: 30.2 pg (ref 26.0–34.0)
MCHC: 31.9 g/dL (ref 30.0–36.0)
MCV: 94.8 fL (ref 80.0–100.0)
Platelets: 222 10*3/uL (ref 150–400)
RBC: 2.68 MIL/uL — ABNORMAL LOW (ref 3.87–5.11)
RDW: 12.6 % (ref 11.5–15.5)
WBC: 28.1 10*3/uL — ABNORMAL HIGH (ref 4.0–10.5)
nRBC: 0 % (ref 0.0–0.2)

## 2024-06-18 LAB — CORTISOL-AM, BLOOD: Cortisol - AM: 265 ug/dL — ABNORMAL HIGH (ref 6.7–22.6)

## 2024-06-18 LAB — PHOSPHORUS: Phosphorus: 3.6 mg/dL (ref 2.5–4.6)

## 2024-06-18 LAB — SARS CORONAVIRUS 2 BY RT PCR: SARS Coronavirus 2 by RT PCR: NEGATIVE

## 2024-06-18 LAB — TSH: TSH: 1.62 u[IU]/mL (ref 0.350–4.500)

## 2024-06-18 LAB — MAGNESIUM: Magnesium: 1.8 mg/dL (ref 1.7–2.4)

## 2024-06-18 MED ORDER — PROPOFOL 1000 MG/100ML IV EMUL
0.0000 ug/kg/min | INTRAVENOUS | Status: DC
  Administered 2024-06-18: 20 ug/kg/min via INTRAVENOUS
  Administered 2024-06-18: 40 ug/kg/min via INTRAVENOUS
  Administered 2024-06-19: 25 ug/kg/min via INTRAVENOUS
  Administered 2024-06-19: 45 ug/kg/min via INTRAVENOUS
  Administered 2024-06-20: 50 ug/kg/min via INTRAVENOUS
  Administered 2024-06-20: 45 ug/kg/min via INTRAVENOUS
  Administered 2024-06-20: 20 ug/kg/min via INTRAVENOUS
  Administered 2024-06-21: 5 ug/kg/min via INTRAVENOUS
  Filled 2024-06-18 (×6): qty 100

## 2024-06-18 MED ORDER — SODIUM ZIRCONIUM CYCLOSILICATE 5 G PO PACK
10.0000 g | PACK | Freq: Once | ORAL | Status: AC
  Administered 2024-06-18: 10 g
  Filled 2024-06-18: qty 2

## 2024-06-18 MED ORDER — INSULIN ASPART 100 UNIT/ML IJ SOLN
0.0000 [IU] | INTRAMUSCULAR | Status: DC
  Administered 2024-06-18: 9 [IU] via SUBCUTANEOUS
  Filled 2024-06-18: qty 9

## 2024-06-18 MED ORDER — INSULIN ASPART 100 UNIT/ML IJ SOLN
0.0000 [IU] | INTRAMUSCULAR | Status: DC
  Administered 2024-06-18 – 2024-06-19 (×2): 2 [IU] via SUBCUTANEOUS
  Administered 2024-06-19: 3 [IU] via SUBCUTANEOUS
  Administered 2024-06-19: 1 [IU] via SUBCUTANEOUS
  Administered 2024-06-19: 2 [IU] via SUBCUTANEOUS
  Administered 2024-06-19 (×2): 1 [IU] via SUBCUTANEOUS
  Filled 2024-06-18 (×2): qty 1
  Filled 2024-06-18: qty 3
  Filled 2024-06-18: qty 2
  Filled 2024-06-18: qty 1
  Filled 2024-06-18 (×2): qty 2

## 2024-06-18 MED ORDER — SODIUM BICARBONATE 8.4 % IV SOLN
100.0000 meq | Freq: Once | INTRAVENOUS | Status: DC
  Filled 2024-06-18: qty 50

## 2024-06-18 MED ORDER — THIAMINE HCL 100 MG PO TABS
100.0000 mg | ORAL_TABLET | Freq: Every day | ORAL | Status: DC
  Administered 2024-06-19 – 2024-06-23 (×5): 100 mg
  Filled 2024-06-18 (×11): qty 1

## 2024-06-18 MED ORDER — DEXTROSE 50 % IV SOLN: 50.0000 mL | Freq: Once | INTRAVENOUS | Status: DC

## 2024-06-18 MED ORDER — INSULIN ASPART 100 UNIT/ML IV SOLN: 10.0000 [IU] | Freq: Once | INTRAVENOUS | Status: DC

## 2024-06-18 MED ORDER — LACTATED RINGERS IV SOLN: INTRAVENOUS | Status: DC

## 2024-06-18 MED ORDER — LACTATED RINGERS IV BOLUS
1000.0000 mL | Freq: Once | INTRAVENOUS | Status: AC
  Administered 2024-06-18: 1000 mL via INTRAVENOUS

## 2024-06-18 MED ORDER — FREE WATER
30.0000 mL | Status: DC
  Administered 2024-06-18 – 2024-06-21 (×15): 30 mL

## 2024-06-18 MED ORDER — DEXTROSE 50 % IV SOLN
0.0000 mL | INTRAVENOUS | Status: AC | PRN
  Administered 2024-06-22 – 2024-06-23 (×4): 50 mL via INTRAVENOUS
  Filled 2024-06-18 (×4): qty 50

## 2024-06-18 MED ORDER — CALCIUM GLUCONATE-NACL 1-0.675 GM/50ML-% IV SOLN
1.0000 g | Freq: Once | INTRAVENOUS | Status: DC
  Filled 2024-06-18: qty 50

## 2024-06-18 MED ORDER — INSULIN REGULAR(HUMAN) IN NACL 100-0.9 UT/100ML-% IV SOLN
INTRAVENOUS | Status: DC
  Administered 2024-06-18: 7 [IU]/h via INTRAVENOUS
  Filled 2024-06-18: qty 100

## 2024-06-18 MED ORDER — NOREPINEPHRINE 4 MG/250ML-% IV SOLN
0.0000 ug/min | INTRAVENOUS | Status: DC
  Administered 2024-06-18: 2 ug/min via INTRAVENOUS
  Filled 2024-06-18: qty 250

## 2024-06-18 MED ORDER — DEXTROSE IN LACTATED RINGERS 5 % IV SOLN: INTRAVENOUS | Status: DC

## 2024-06-18 MED ORDER — INSULIN GLARGINE 100 UNIT/ML ~~LOC~~ SOLN
5.0000 [IU] | Freq: Every day | SUBCUTANEOUS | Status: DC
  Administered 2024-06-18 – 2024-06-19 (×2): 5 [IU] via SUBCUTANEOUS
  Filled 2024-06-18 (×3): qty 0.05

## 2024-06-18 MED ORDER — VITAL 1.5 CAL PO LIQD
1000.0000 mL | ORAL | Status: DC
  Administered 2024-06-18 – 2024-06-22 (×4): 1000 mL

## 2024-06-18 MED ORDER — SODIUM BICARBONATE 8.4 % IV SOLN
50.0000 meq | Freq: Once | INTRAVENOUS | Status: AC
  Administered 2024-06-18: 50 meq via INTRAVENOUS
  Filled 2024-06-18: qty 50

## 2024-06-18 NOTE — Progress Notes (Signed)
 PHARMACY CONSULT NOTE - FOLLOW UP  Pharmacy Consult for Electrolyte Monitoring and Replacement   Recent Labs: Potassium (mmol/L)  Date Value  06/18/2024 5.9 (H)  03/01/2013 4.1   Magnesium  (mg/dL)  Date Value  98/73/7973 1.8   Calcium  (mg/dL)  Date Value  98/73/7973 7.7 (L)   Calcium , Total (mg/dL)  Date Value  89/90/7985 9.1   Albumin  (g/dL)  Date Value  98/74/7973 3.5  12/21/2023 3.3 (L)  03/01/2013 3.9   Phosphorus (mg/dL)  Date Value  98/73/7973 3.6   Sodium (mmol/L)  Date Value  06/18/2024 138  12/21/2023 145 (H)  03/01/2013 133 (L)     Assessment: 34 y.o. female with medical history significant for NICM, CHF, DVT, HTN, esophageal ulcer, PEA arrest, C. Diff, CKD III, type 1 diabetes mellitus (brittle), peripheral neuropathy, suspected EPI (not tested), anxiety, depression, hypertension, severe malnutrition, diabetic gastroparesis requiring J-tube (s/p open placement 06/13/22, dislodged 06/2023), GERD, autoimmune thyroid  disease, celiac disease (reported/neg on biopsy), anemia, autoimmune polyendocrine syndrome type 2, adrenal insufficiency, hyperemesis in pregnancy and shingles who is admitted with AMS and hypoglycemia requiring intubation for airway protection. Pharmacy is asked to follow and replace electrolytes while in CCU  Goal of Therapy:  Electrolytes WNL  Plan:  --sodium zirconium cyclosilicate  10 grams po x 1 --BMP every 4 hours while on IV insulin   Ashley Camacho ,PharmD Clinical Pharmacist 06/18/2024 7:07 AM

## 2024-06-18 NOTE — Progress Notes (Signed)
 Updated pts mother Rosaline Ezekiel via telephone regarding pts condition and current plan of care.  All questions answered.  Will continue to monitor and assess pt.  Lonell Moose, AGNP  Pulmonary/Critical Care Pager [phone] (please enter 7 digits) PCCM Consult Pager [phone] (please enter 7 digits) a

## 2024-06-18 NOTE — Progress Notes (Signed)
 Continuous infusions, propofol  and levophed , switched to MRI pumps.

## 2024-06-18 NOTE — Progress Notes (Signed)
 Eeg done

## 2024-06-18 NOTE — Progress Notes (Signed)
 "  NAME:  Ashley Camacho, MRN:  [id number], DOB:  December 26, 1990, LOS: 1 ADMISSION DATE:  06/17/2024, CONSULTATION DATE: 06/17/2024 REFERRING MD: Dr. Floy, CHIEF COMPLAINT: Unresponsiveness    History of Present Illness:  34 y/o female with a medical history as indicated below whop presented to the ED via EMS after being found unresponsive. History is obtained from ED records, EMS records and from speaking with patient's mother. Per EMS records, patient was found unresponsive by her friend. Total downtime is uncertain. Blood glucose with EMS was 27 mg/dl. She was given an amp of glucagon  and blood glucose improved to 44 mg/dl.   Upon arrival in the ED, patient was still unresponsive, hypothermic, hypoxic on 3L [place] and pupils were dilated. An I/O was placed in her right leg. She given an amp of D50. Glucose improved to 252 mg/dl. Despite increase in blood glucose, she remained unresponsive and hypoxic hence she was emergently intubated. PCCM consulted for further management.  ED work-up: CT head negative. Chest x-ray shows left lower lobe atelectasis. Labs pending   Of note, patient was diagnosed with influenza A on 05/27/24 and was hospitalized from 1/4-05/30/2024. Per patient's mother, she is a very fragile diabetic. She states that she has been buying groceries for patient because she is not able to afford food. Patient lives with friends. Unfortunately she does not have the contact information for patient's friend who called EMS or the friends with whom she live  Pertinent  Medical History   Past Medical History:  Diagnosis Date   Acute metabolic encephalopathy 06/10/2022   Adrenal insufficiency    Anemia    Anxiety    Blood transfusion without reported diagnosis    CHF (congestive heart failure) (HCC)    Chronic kidney disease    Depression    Gastroparesis    Hypertension    Hypotension    Intractable nausea and vomiting 11/13/2022   Migraine    Thyroid  disease    Type 1 diabetes (HCC)     Micro Data:  01/25: MRSA PCR>>negative 01/25: Blood x2>>NGTD 01/25: RVP>>negative  01/25: Tracheal aspirate>> 01/26: COVID>>  Anti-infectives (From admission, onward)    Start     Dose/Rate Route Frequency Ordered Stop   06/17/24 1700  cefTRIAXone  (ROCEPHIN ) 1 g in sodium chloride  0.9 % 100 mL IVPB        1 g 200 mL/hr over 30 Minutes Intravenous Every 24 hours 06/17/24 1558     06/17/24 1600  azithromycin  (ZITHROMAX ) tablet 500 mg        500 mg Per Tube Daily 06/17/24 1558     06/17/24 1600  vancomycin  (VANCOCIN ) IVPB 1000 mg/200 mL premix  Status:  Discontinued        1,000 mg 200 mL/hr over 60 Minutes Intravenous  Once 06/17/24 1558 06/17/24 1952      Significant Hospital Events: Including procedures, antibiotic start and stop dates in addition to other pertinent events   01/25: Admitted with severe hypoglycemia and acute hypoxic respiratory failure  01/26: Pt now with DKA requiring insulin  gtt remains mechanically intubated on minimal vent settings.  Mentation precluding extubation at this time   Interim History / Subjective:  As outlined above under significant events   Objective    Blood pressure (!) 72/63, pulse 96, temperature 97.9 F (36.6 C), resp. rate 13, weight 55.2 kg, SpO2 100%.    Vent Mode: PRVC FiO2 (%):  [30 %-35 %] 30 % Set Rate:  [18 bmp] 18 bmp Vt Set:  [  400 mL] 400 mL PEEP:  [5 cmH20] 5 cmH20 Plateau Pressure:  [16 cmH20] 16 cmH20   Intake/Output Summary (Last 24 hours) at 06/18/2024 9075 Last data filed at 06/18/2024 9077 Gross per 24 hour  Intake 1576.95 ml  Output 425 ml  Net 1151.95 ml   Filed Weights   06/18/24 0500  Weight: 55.2 kg    Examination: General: Acutely-ill appearing female sedated not following commands, NAD mechanically intubated  HENT: Supple, no JVD  Lungs: Clear throughout, even, non labored  Cardiovascular: Sinus rhythm, s1s2, no m/r/g, 1+ radial/1+ distal pulses, no edema  Abdomen: +BS x4, soft, non distended   Extremities: Normal bulk and tone  Neuro: Sedated, not following commands, bilateral pupils 2 mm sluggish  GU: Indwelling foley catheter in place draining yellow urine   Resolved problem list   Assessment and Plan   #Acute toxic metabolic encephalopathy  #Sedation needs due to mechanical intubation pain/discomfort  #Suspect neuroglycopenia  Hx: Anxiety and depression  Urine drug screen: negative  CT Head: No acute intracranial abnormality. Left maxillary and bilateral ethmoid sinus mucosal thickening with layering secretions, which can be seen with acute sinusitis. - Correct metabolic derangements  - Maintain normoglycemia  - Avoid sedating medications as able  - Maintain RASS goal of 0 to -1 - PAD protocol to maintain RASS goal: propofol  gtt prn  - WUA daily  - Cerebell placed overnight neurology to review, conventional EEG pending   #Hypotension  #Mildly elevated troponin likely secondary to demand ischemia  Hx: Chronic diastolic CHF and hypotension  - Continuous telemetry monitoring  - Aggressive iv fluid resuscitation and prn levophed  gtt to maintain map >65 - Lactic acid normal  - Continue outpatient rosuvastatin   - Hold outpatient antihypertensives and diuretics for now   #Mechanical intubation for airway protection  - Full vent support for now: vent settings reviewed and established - Continue lung protective strategies  - Maintain plateau pressures less than 30 cm H2O - VAP bundle implemented  - SBT once mentation and respiratory status permits  - Intermittent CXR and ABG's  - Prn bronchodilator therapy   #Acute kidney injury secondary to ATN  #Hyperkalemia  #Anion gap metabolic acidosis  - Trend BMP and vbg's  - Replace electrolytes as indicated  - Strict I&O's - Avoid nephrotoxic agents as able  - 1g calcium  gluconate and lokelma  x1 dose for hyperkalemia   #Leukocytosis  - Trend WBC and monitor fever curve  - Follow cultures  - Continue abx as outlined  above pending culture results and sensitivities   #Chronic anemia  - Trend CBC  - Monitor for s/sx of bleeding  - Transfuse for hgb <7 - Subcutaneous heparin  for VTE px   #Adrenal insufficiency  - Continue solucortef 100 mg q8hrs for now   #Hypothyroidism  - Continue synthroid    #Gastroparesis  - PPI   #Hypoglycemia~resolved #DKA  - Continue insulin  gtt until anion gap closed and serum CO2 20 or higher  - IV fluids per DKA protocol  - CBG's per endoTool  - BMP q4hr and beta-hydroxy q8hrs while on insulin  gtt - Target CBG readings 140 to 180 - Follow hyper/hypoglycemic protocol  - Diabetes coordinator consulted appreciate input   Labs   CBC: Recent Labs  Lab 06/13/24 1606 06/17/24 1600 06/18/24 0440  WBC 8.0 5.8 28.1*  NEUTROABS 7.0 4.8  --   HGB 8.1* 8.4* 8.1*  HCT 25.0* 25.4* 25.4*  MCV 94.3 91.4 94.8  PLT 142* 84* 222  Basic Metabolic Panel: Recent Labs  Lab 06/17/24 1600 06/17/24 1929 06/18/24 0440 06/18/24 0602 06/18/24 0700  NA 144 141 136 138 137  K 3.7 3.5 7.1* 5.9* 5.4*  CL 112* 110 103 104 105  CO2 21* 21* 12* 10* 12*  GLUCOSE 199* 208* 443* 430* 371*  BUN 35* 33* 38* 39* 38*  CREATININE 1.40* 1.25* 1.93* 2.05* 2.10*  CALCIUM  8.4* 7.8* 8.1* 7.7* 7.9*  MG  --   --  1.8  --   --   PHOS  --   --  3.6  --   --    GFR: Estimated Creatinine Clearance: 28.8 mL/min (A) (by C-G formula based on SCr of 2.1 mg/dL (H)). Recent Labs  Lab 06/13/24 1606 06/17/24 1600 06/17/24 1820 06/18/24 0440  WBC 8.0 5.8  --  28.1*  LATICACIDVEN  --  2.1* 1.9 1.4    Liver Function Tests: Recent Labs  Lab 06/17/24 1600  AST 32  ALT 11  ALKPHOS 59  BILITOT 0.3  PROT 5.9*  ALBUMIN  3.5   No results for input(s): LIPASE, AMYLASE in the last 168 hours. No results for input(s): AMMONIA in the last 168 hours.  ABG    Component Value Date/Time   PHART 7.21 (L) 06/18/2024 0409   PCO2ART 27 (L) 06/18/2024 0409   PO2ART 153 (H) 06/18/2024 0409    HCO3 10.8 (L) 06/18/2024 0409   ACIDBASEDEF 15.5 (H) 06/18/2024 0409   O2SAT 99.4 06/18/2024 0409     Coagulation Profile: No results for input(s): INR, PROTIME in the last 168 hours.  Cardiac Enzymes: No results for input(s): CKTOTAL, CKMB, CKMBINDEX, TROPONINI in the last 168 hours.  HbA1C: Hgb A1c MFr Bld  Date/Time Value Ref Range Status  05/27/2024 11:28 PM 7.1 (H) 4.8 - 5.6 % Final    Comment:    (NOTE) Diagnosis of Diabetes The following HbA1c ranges recommended by the American Diabetes Association (ADA) may be used as an aid in the diagnosis of diabetes mellitus.  Hemoglobin             Suggested A1C NGSP%              Diagnosis  <5.7                   Non Diabetic  5.7-6.4                Pre-Diabetic  >6.4                   Diabetic  <7.0                   Glycemic control for                       adults with diabetes.    10/09/2023 08:34 PM 8.0 (H) 4.8 - 5.6 % Final    Comment:    (NOTE) Pre diabetes:          5.7%-6.4%  Diabetes:              >6.4%  Glycemic control for   <7.0% adults with diabetes     CBG: Recent Labs  Lab 06/18/24 0429 06/18/24 0628 06/18/24 0644 06/18/24 0726 06/18/24 0841  GLUCAP 399* 341* 346* 344* 207*    Review of Systems:   Unable to assess pt mechanically intubated   Past Medical History:  She,  has a past medical history of Acute metabolic encephalopathy (06/10/2022),  Adrenal insufficiency, Anemia, Anxiety, Blood transfusion without reported diagnosis, CHF (congestive heart failure) (HCC), Chronic kidney disease, Depression, Gastroparesis, Hypertension, Hypotension, Intractable nausea and vomiting (11/13/2022), Migraine, Thyroid  disease, and Type 1 diabetes (HCC).   Surgical History:   Past Surgical History:  Procedure Laterality Date   BIOPSY  01/14/2021   Procedure: BIOPSY;  Surgeon: Eda Iha, MD;  Location: [facility] ENDOSCOPY;  Service: Gastroenterology;;   CENTRAL LINE INSERTION N/A  10/10/2023   Procedure: CENTRAL LINE INSERTION;  Surgeon: Marea Selinda RAMAN, MD;  Location: [facility] INVASIVE CV LAB;  Service: Cardiovascular;  Laterality: N/A;   CESAREAN SECTION     x2   CESAREAN SECTION WITH BILATERAL TUBAL LIGATION N/A 02/17/2021   Procedure: CESAREAN SECTION WITH BILATERAL TUBAL LIGATION;  Surgeon: Barbra Lang PARAS, DO;  Location: [facility] LD ORS;  Service: Obstetrics;  Laterality: N/A;   ESOPHAGOGASTRODUODENOSCOPY (EGD) WITH PROPOFOL  N/A 01/14/2021   Procedure: ESOPHAGOGASTRODUODENOSCOPY (EGD) WITH PROPOFOL ;  Surgeon: Eda Iha, MD;  Location: [facility] ENDOSCOPY;  Service: Gastroenterology;  Laterality: N/A;   IR BONE MARROW BIOPSY & ASPIRATION  12/16/2022   IR REPLC DUODEN/JEJUNO TUBE PERCUT W/FLUORO  11/22/2022   JEJUNOSTOMY N/A 06/13/2022   Procedure: DEXTER;  Surgeon: Jordis Laneta FALCON, MD;  Location: [facility] ORS;  Service: General;  Laterality: N/A;   MOUTH SURGERY     TEE WITHOUT CARDIOVERSION N/A 01/06/2024   Procedure: ECHOCARDIOGRAM, TRANSESOPHAGEAL;  Surgeon: Rolan Ezra RAMAN, MD;  Location: [facility] ORS;  Service: Cardiovascular;  Laterality: N/A;   TUBAL LIGATION       Social History:   reports that she has never smoked. She has never been exposed to tobacco smoke. She has never used smokeless tobacco. She reports that she does not drink alcohol and does not use drugs.   Family History:  Her family history includes ADD / ADHD in her daughter; Breast cancer in her mother; CAD in her father, paternal grandfather, and paternal grandmother; Cancer in her mother; Diabetes in her father, maternal uncle, paternal grandfather, and paternal grandmother; Diabetes type I in her father; Early death in her father; Lung cancer in her maternal grandfather; Seizures in her mother. There is no history of Ovarian cancer.   Allergies Allergies[1]   Home Medications  Prior to Admission medications  Medication Sig Start Date End Date Taking? Authorizing Provider  acetaminophen  (TYLENOL )  500 MG tablet Take 2 tablets (1,000 mg total) by mouth every 6 (six) hours as needed for headache, fever or moderate pain. 08/05/22  Yes Dorinda Drue DASEN, MD  hydrocortisone  (CORTEF ) 10 MG tablet Take 2 tablets (20 mg total) by mouth daily with breakfast AND 1 tablet (10 mg total) daily with supper. 05/27/23  Yes Sreenath, Sudheer B, MD  insulin  aspart (NOVOLOG ) 100 UNIT/ML FlexPen Inject 2 Units into the skin 3 (three) times daily with meals. If eating and Blood Glucose (BG) 80 or higher inject 2 units for meal coverage and add correction dose per scale. If not eating, correction dose only. BG <150= 0 unit; BG 150-200= 1 unit; BG 201-250= 3 unit; BG 251-300= 5 unit; BG 301-350= 7 unit; BG 351-400= 9 unit; BG >400= 11 unit and Call Primary Care. 10/12/23  Yes [name], MD  Accu-Chek Softclix Lancets lancets Use to check blood sugar up to 6 times daily. May substitute to any manufacturer covered by patient's insurance. 01/19/24   Ostwalt, [name], PA-C  BD PEN NEEDLE MINI ULTRAFINE 31G X 5 MM MISC by Other route. 07/18/23 07/17/24  [provider]  Blood Glucose  Monitoring Suppl (BLOOD GLUCOSE MONITOR SYSTEM) w/Device KIT Use to check blood sugar up to 6 times daily. May substitute to any manufacturer covered by patient's insurance. 01/19/24   Ostwalt, [name], PA-C  Continuous Glucose Sensor (DEXCOM G7 SENSOR) MISC Use to check blood sugar. Change every 10 days 01/19/24   Ostwalt, [name], PA-C  furosemide  (LASIX ) 20 MG tablet Take 1 tablet (20 mg total) by mouth daily as needed. 02/17/24 06/13/24  [place] A, FNP  Glucose Blood (BLOOD GLUCOSE TEST STRIPS) STRP Use to check blood sugar up to 6 times daily. May substitute to any manufacturer covered by patient's insurance. 01/19/24   Ostwalt, [name], PA-C  guaiFENesin  (MUCINEX ) 600 MG 12 hr tablet Take 1 tablet (600 mg total) by mouth 2 (two) times daily. Patient not taking: No sig reported 05/30/24   Arlon Carliss ORN, DO  Insulin  Pen Needle (PEN NEEDLES)  31G X 6 MM MISC PEN NEEDLES 31G X 6 MM 05/31/17   [provider]  Lancet Device MISC Use to check blood sugar up to 6 times daily. May substitute to any manufacturer covered by patient's insurance. 01/19/24   Ostwalt, [name], PA-C  levothyroxine  (SYNTHROID ) 75 MCG tablet TAKE 1 TABLET (75 MCG TOTAL) BY MOUTH DAILY AT 6 (SIX) AM. 12/06/23   Ostwalt, [name], PA-C  metoprolol  succinate (TOPROL -XL) 25 MG 24 hr tablet TAKE 1 TABLET (25 MG TOTAL) BY MOUTH DAILY. Patient not taking: Reported on 06/17/2024 03/23/24   Rolan Ezra RAMAN, MD  nortriptyline (PAMELOR) 25 MG capsule Take 25 mg by mouth at bedtime.    [provider]  NOVOLOG  100 UNIT/ML injection Inject 0-50 Units into the skin as needed for high blood sugar. FOR USE IN INSULIN  PUMP. MAX 50 UNITS PER DAY. 04/14/24   [provider]  oseltamivir  (TAMIFLU ) 30 MG capsule Take 1 capsule (30 mg total) by mouth daily. Patient not taking: No sig reported 05/31/24   Arlon Carliss ORN, DO  rosuvastatin  (CRESTOR ) 10 MG tablet Take 1 tablet (10 mg total) by mouth daily. 01/18/24   [place] LABOR, FNP  sacubitril -valsartan  (ENTRESTO ) 24-26 MG Take 1 tablet by mouth 2 (two) times daily. 02/28/24   Rolan Ezra RAMAN, MD  sodium zirconium cyclosilicate  (LOKELMA ) 10 g PACK packet Take 10 g by mouth daily. 02/28/24   Rolan Ezra RAMAN, MD     Critical care time: 50 minutes      Lonell Moose, AGNP  Pulmonary/Critical Care Pager [phone] (please enter 7 digits) PCCM Consult Pager [phone] (please enter 7 digits)          [1] No Known Allergies  "

## 2024-06-18 NOTE — Procedures (Signed)
 Patient Name: Ashley Camacho  MRN: [id number]  Epilepsy Attending: Arlin MALVA Krebs  Referring Physician/Provider: Maranda Lonell MATSU, NP  Date: 06/18/2024 Duration: 30.22 mins  Patient history: 33yo F with ams. EEG to evaluate for seizure.   Level of alertness: comatose/ lethargic  AEDs during EEG study: None  Technical aspects: This EEG study was done with scalp electrodes positioned according to the 10-20 International system of electrode placement. Electrical activity was reviewed with band pass filter of 1-70Hz , sensitivity of 7 uV/mm, display speed of 27mm/sec with a 60Hz  notched filter applied as appropriate. EEG data were recorded continuously and digitally stored.  Video monitoring was available and reviewed as appropriate.  Description: EEG showed continuous generalized polymorphic 3 to 6 Hz theta-delta slowing. Hyperventilation and photic stimulation were not performed.     ABNORMALITY - Continuous slow, generalized  IMPRESSION: This study is suggestive of diffuse cerebral dysfunction (encephalopathy). No seizures or definite epileptiform discharges were seen throughout the recording.  Evanne Matsunaga O Frances Joynt

## 2024-06-18 NOTE — Progress Notes (Signed)
 Ceribell is on.

## 2024-06-18 NOTE — TOC Initial Note (Signed)
 Transition of Care [facility]) - Initial/Assessment Note    Patient Details  Name: Ashley Camacho MRN: [id number] Date of Birth: 08-17-1990  Transition of Care [facility]) CM/SW Contact:    Corrie JINNY Ruts, LCSW Phone Number: 06/18/2024, 12:09 PM  Clinical Narrative:                 Chart reviewed. The patient is intubated/sedated. Secure chat recived that the patient maybe experiencing food insecurity. I was able to speak with the patient mother, Ashley Camacho. I introduced myself, my role, and reason for consult. Per chart review that patient PCP is [name]. The patient mother report that the patient lives in the home with her significant other and friend, brittany.   The patient mother reports that the patient can complete task independently. The patient mother reports that she, the patient significant other, or friend will assist the patient at D/C. The patient mother [facility] The patient mother reports that the patient doesn't have any equipment in the home.  SW inquired about any food insecurities and offered resources for the patient. The patient mother was accepting.      Barriers to Discharge: Continued Medical Work up   Patient Goals and CMS Choice            Expected Discharge Plan and Services                                              Prior Living Arrangements/Services   Lives with:: Significant Other, Friends Patient language and need for interpreter reviewed:: Yes        Need for Family Participation in Patient Care: Yes (Comment)     Criminal Activity/Legal Involvement Pertinent to Current Situation/Hospitalization: No - Comment as needed  Activities of Daily Living      Permission Sought/Granted   Permission granted to share information with : Yes, Release of Information Signed  Share Information with NAME: Glynn Freas     Permission granted to share info w Relationship: Mother  Permission granted to share  info w Contact Information: [phone]  Emotional Assessment   Attitude/Demeanor/Rapport: Unable to Assess Affect (typically observed): Unable to Assess Orientation: :  (Patient is intubated/sedated.) Alcohol / Substance Use: Not Applicable Psych Involvement: No (comment)  Admission diagnosis:  Acute hypoxic respiratory failure (HCC) [J96.01] Patient Active Problem List   Diagnosis Date Noted   Acute hypoxic respiratory failure (HCC) 06/17/2024   Influenza A 05/28/2024   Elevated troponin 05/28/2024   Dyslipidemia 05/27/2024   Pancreatic insufficiency 05/27/2024   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) 05/27/2024   Nonrheumatic mitral valve regurgitation 01/06/2024   Volume overload 01/03/2024   Chest pain 01/02/2024   Leg swelling 12/21/2023   HTN (hypertension) 11/22/2023   Chronic diastolic CHF (congestive heart failure) (HCC) 11/22/2023   Abnormal LFTs 11/22/2023   HCAP (healthcare-associated pneumonia) 11/22/2023   Generalized weakness 11/22/2023   Sepsis due to pneumonia (HCC) 11/13/2023   ABLA (acute blood loss anemia) 11/13/2023   Moderate protein malnutrition 11/13/2023   Hyponatremia 11/13/2023   Norovirus 11/02/2023   Sepsis secondary to UTI (HCC) 10/31/2023   Sepsis due to urinary tract infection (HCC) 10/31/2023   Stage 3b chronic kidney disease (HCC) 10/25/2023   Depression, recurrent 10/25/2023   Tinnitus of right ear 10/25/2023   Encounter for central line  placement 10/10/2023   History of DVT (deep vein thrombosis) 05/26/2023   Lactic acid acidosis 05/26/2023   Depression 04/07/2023   Adrenal insufficiency 04/07/2023   Malnutrition of moderate degree 03/24/2023   Type 1 diabetes mellitus with hyperosmolar hyperglycemic state (HHS) (HCC) 03/23/2023   Diabetes mellitus type 1 with hyperosmolarity (HCC) 03/23/2023   Anemia    Anemia in chronic kidney disease (CKD) 12/30/2022   AKI (acute kidney injury) 11/22/2022   Dislodged  jejunostomy tube 11/22/2022   Type 1 diabetes mellitus (HCC) 11/22/2022   DVT (deep venous thrombosis) (HCC) 11/20/2022   Failure to thrive in adult 11/13/2022   Hyperglycemia 11/13/2022   Intractable nausea and vomiting 11/13/2022   Normocytic anemia 11/12/2022   Severe recurrent major depression (HCC) 11/03/2022   CKD stage 3a, GFR 45-59 ml/min (HCC) 11/01/2022   Elevated serum creatinine 11/01/2022   Abdominal pain 11/01/2022   Cystitis 11/01/2022   Acute exacerbation of congestive heart failure (HCC) 08/23/2022   Diabetic ketoacidosis (HCC) 08/23/2022   Hypothermia 07/27/2022   CKD stage 3b, GFR 30-44 ml/min (HCC) 06/29/2022   Hypovolemic shock (HCC) 06/28/2022   Jejunostomy tube present (HCC) 06/28/2022   Insomnia 06/20/2022   Hypophosphatemia 06/13/2022   Severe malnutrition 06/13/2022   Hypotension 06/10/2022   Toxic metabolic encephalopathy 06/10/2022   Pseudohyponatremia 06/10/2022   Hypomagnesemia 06/10/2022   Gastroenteritis 02/28/2022   Hypocalcemia 02/28/2022   Metabolic acidosis 02/28/2022   Essential hypertension 02/28/2022   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   Nausea & vomiting 02/28/2022   High anion gap metabolic acidosis 01/02/2022   UTI (urinary tract infection) 11/03/2021   Hypoglycemia due to type 1 diabetes mellitus (HCC) 11/02/2021   Thrombocytopenia 11/02/2021   Problem related to social environment 09/24/2021   Rhinovirus 09/22/2021   Acute on chronic HFrEF (heart failure with reduced ejection fraction) (HCC) 09/07/2021   Generalized anxiety disorder 09/01/2021   Pneumonia due to human metapneumovirus 08/31/2021   Gastro-esophageal reflux disease with esophagitis 08/31/2021   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/31/2021   Congestive heart failure (HCC) 08/31/2021   Hypoglycemia due to insulin  06/14/2021   DKA (diabetic ketoacidosis) (HCC) 06/14/2021   CKD stage 3 due to type 1 diabetes mellitus (HCC) 06/14/2021   Transaminitis 06/14/2021    Hypoglycemia associated with diabetes (HCC) 06/03/2021   DKA, type 1 (HCC) 05/20/2021   Major depressive disorder, recurrent episode, moderate (HCC) 05/08/2021   Right arm pain 05/07/2021   Rash of hand 05/07/2021   Hyperkalemia 05/06/2021   History of adrenal insufficiency 05/06/2021   CAP (community acquired pneumonia) 05/05/2021   Hypoglycemia 04/27/2021   Combined systolic and diastolic heart failure (HCC) 03/11/2021   Neuroglycopenia 03/11/2021   Protein-calorie malnutrition, severe 03/11/2021   Adrenal insufficiency (Addison's disease) (HCC) 03/10/2021   Postoperative hematoma involving genitourinary system following genitourinary procedure    S/P cesarean section 02/17/2021   Acute esophagitis    Leg edema    History of preterm delivery, currently pregnant    Pre-existing type 1 diabetes mellitus during pregnancy in third trimester    Anemia of chronic disease    Type 1 diabetes mellitus affecting pregnancy in second trimester, antepartum    HFrEF (heart failure with reduced ejection fraction) (HCC)    Acute deep vein thrombosis (DVT) of brachial vein of right upper extremity (HCC) 12/17/2020   Anxiety 12/17/2020   Diabetic retinopathy (HCC) 12/17/2020   Chronic combined systolic and diastolic CHF (congestive heart failure) (HCC)    Hyperemesis 12/13/2020   Type 1 diabetes mellitus with hypoglycemia  without coma (HCC) 12/13/2020   Sunburn 12/13/2020   Hypothyroidism 12/10/2020   Malnutrition 12/10/2020   Vitreous hemorrhage of right eye (HCC) 12/10/2020   Pleural effusion associated with pulmonary infection 11/25/2020   Pneumonia affecting pregnancy 11/24/2020   Diabetes mellitus affecting pregnancy, second trimester 11/24/2020   Edema 11/24/2020   Decreased urine output 11/24/2020   Shortness of breath    Zinc  deficiency    SOB (shortness of breath)    Social problem 11/21/2020   Nausea and vomiting during pregnancy prior to [redacted] weeks gestation 11/21/2020   Low serum  vitamin B12    Delivery with history of C-section 11/20/2020   Absolute anemia    Acute kidney injury superimposed on chronic kidney disease    Nausea and vomiting during pregnancy 10/23/2020   History of premature delivery, currently pregnant, second trimester 10/13/2020   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 [place] [address] Phone: [phone] Fax: [phone]  [facility] [address] [place] [address] Phone: [phone] Fax: [phone]     Social Drivers of Health (SDOH) Social History: SDOH Screenings   Food Insecurity: Patient Unable To Answer (06/17/2024)  Recent Concern: Food Insecurity - Food Insecurity Present (05/30/2024)  Housing: Patient Unable To Answer (06/17/2024)  Recent Concern: Housing - High Risk (05/30/2024)  Transportation Needs: Patient Unable To Answer (06/17/2024)  Utilities: Patient Unable To Answer (06/17/2024)  Alcohol Screen: Low Risk (04/11/2024)  Depression (PHQ2-9): Medium Risk (06/13/2024)  Financial Resource Strain: Medium Risk (04/11/2024)  Physical Activity: Insufficiently Active (04/11/2024)  Social Connections: Socially Isolated (04/11/2024)  Stress: Stress Concern Present (04/11/2024)  Tobacco Use: Low Risk (06/13/2024)  Health Literacy: Adequate Health Literacy (12/21/2023)   SDOH Interventions:     Readmission Risk Interventions    06/18/2024   12:07 PM 01/04/2024   12:41 PM 11/15/2023    4:19 PM  Readmission Risk Prevention Plan  Transportation Screening  Complete Complete  Medication Review (RN Care Manager)  Complete Complete Complete  PCP or Specialist appointment within 3-5 days of discharge Complete Complete Complete  HRI or Home Care Consult Complete    SW Recovery Care/Counseling Consult Complete Complete Complete  Palliative Care Screening Not Applicable Not Applicable Not Applicable  Skilled Nursing Facility Not Applicable Not Applicable Not Applicable

## 2024-06-18 NOTE — Progress Notes (Signed)
 Critical potassium of 7.1. Provider notified. Verbal order for stat BMP ordered and sent to confirm. Awaiting results.

## 2024-06-18 NOTE — Discharge Instructions (Signed)
 Food Resources  Agency Name: [facility] Address: [address], [place], [place] [address] Phone: [phone] Website: [email or web address] Service(s) Offered: Housing services, self-sufficiency, congregate meal program, weatherization program, Event organiser program, emergency food assistance,  housing counseling, home ownership program, wheels - to work program.  [employer] free for 60 and older at various locations from [employer], Monday-Friday:  [employer], [address] [place], [phone] -[facility], [address], [place] [phone]  -[facility], [address] [place] [phone]  -[address], [address], [place], [phone]  Agency Name: [employer] Address: [phone] [address], [address], [place], [place] [address] Phone: [phone] Website: [email or web address] Service(s) Offered: Home delivered hot, frozen, and emergency  meals. Grocery assistance program which matches  volunteers one-on-one with seniors unable to grocery shop  for themselves. Must be 60 years and older; less than 20  hours of in-home aide service, limited or no driving ability;  live alone or with someone with a disability; live in  [place].  Agency Name: [employer] [facility] [employer]) Address: [address], [place], [place] [address] Phone: [phone] Service(s) Offered: Food is served to shut-ins, homeless, elderly, and low income people in the community every Saturday (11:30 am-12:30 pm) and Sunday (12:30 pm-1:30pm). Volunteers also offer help and encouragement in seeking employment,  and spiritual guidance.  Agency Name: [employer] Address: [address] [place], [place] [address] Phone: [phone] Service(s) Offered: Child support services; child welfare services; food stamps; Medicaid; work first family assistance; and aid  with fuel,  rent, food and medicine.  Agency Name: [employer] Address: [address], [place], [place] Phone: [phone] Website: [email or web address] Services Offered: Monday 10:00am-12:00, 8:00pm-9:00pm, and Friday 10:00am-12:00.  Agency Name: [employer] [facility] Address: [address] [place], [place] [address] Phone: [phone] Website: [email or web address] Service(s) Offered: Serves weekday meals, open from 11:30 am- 1:00 pm., and 6:30-7:30pm, Monday-Wednesday-Friday distributes food 3:30-6pm, Monday-Wednesday-Friday.  Agency Name: [facility] Address: [address], [place], [place] Phone: [phone] Website: [email or web address] Services Offered: Distributes food the 4th Saturday of the month, starting at 8:00 am  Agency Name: [facility] Address: [phone] [place], [place] [address] Phone: [phone] Website: [email or web address] Service(s) Offered: [employer] life, weekly food pantry. Open Wednesdays from 10:00am-noon.  Agency Name: [facility] [employer] Address: [address] [place], [place], [place] Phone: [phone] Services Offered: Distributes food 9am-1pm, Monday-Thursday. Call for details.  Agency Name: [facility] Address: [address] [place], [place] [address] Phone: [phone] Website: [email or web address] Service(s) Offered: Games developer. Call for assistance.  Agency Name: [facility] Address: [address], [place], [place] [address] Phone: [phone] Service Offered: Emergency Food Pantry. Call for appointment.  Agency Name: [facility] Address: [address], [place], [place] [address] Phone: [phone] Website: [email or web address] Services Offered: Games developer. Call for details  Agency Name: [facility] Address: [address] [place], [place] Phone:  [phone] Website: [email or web address] Service(s) Offered: Emergency Food Pantry. Call for details.  Agency Name: [employer] representative Address: [address], [place], [place] [address] Phone: [phone] or [phone] Website: [email or web address] Service(s) Offered: Distribute food 9am-11:30 am, Tuesday-Friday, and 1-3:30pm, Monday-Friday. Food pantry Monday-Friday 1pm-3pm, fresh items, Mon.-Wed.-Fri.  Agency Name: [facility] Address: [address] [place], [place] [address] Phone: [phone] Website: [email or web address] Services Offered: Distribute food Tues and Sats from 9:00am-noon.  Closed 1st Saturday of each month. Call for details  Agency Name: [employer] Address: Fayrene Boatman [facility] [address], [place] [address] Phone: [phone]  Services Offered: Delivers meals every Thursday

## 2024-06-18 NOTE — Plan of Care (Signed)

## 2024-06-18 NOTE — Inpatient Diabetes Management (Addendum)
 Inpatient Diabetes Program Recommendations  AACE/ADA: New Consensus Statement on Inpatient Glycemic Control (2015)  Target Ranges:  Prepandial:   less than 140 mg/dL      Peak postprandial:   less than 180 mg/dL (1-2 hours)      Critically ill patients:  140 - 180 mg/dL   Lab Results  Component Value Date   GLUCAP 344 (H) 06/18/2024   HGBA1C 7.1 (H) 05/27/2024    Latest Reference Range & Units 06/18/24 07:00  Sodium 135 - 145 mmol/L 137  Potassium 3.5 - 5.1 mmol/L 5.4 (H)  Chloride 98 - 111 mmol/L 105  CO2 22 - 32 mmol/L 12 (L)  Glucose 70 - 99 mg/dL 628 (H)  BUN 6 - 20 mg/dL 38 (H)  Creatinine 9.55 - 1.00 mg/dL 7.89 (H)  Calcium  8.9 - 10.3 mg/dL 7.9 (L)  Anion gap 5 - 15  20 (H)  (H): Data is abnormally high (L): Data is abnormally low  Diabetes history: DM1 Outpatient Diabetes medications: OmniPod insulin  pump with Novolog , Dexcom G7; off pump plan Lantus  11 units daily, Novolog   1 unit per 20 grams of carbs plus correction of 1:70 mg/dl Current orders for Inpatient glycemic control:  IV insulin  Solucortef 100 mg q 8 hrs.  Inpatient Diabetes Program Recommendations:   Patient well known to DM coordinators and patient is very sensitive to insulin . Will follow while hospitalized.  When patient ready for transition from insulin  drip, please consider: -Lantus  5 units daily (give first dose 2 hrs. Prior to D/C of insulin  drip) -Novolog  0-6 units q 4 hrs. correction  Thank you, Dagoberto BRAVO. Marsalis Beaulieu, RN, MSN, CNS, CDCES  Diabetes Coordinator Inpatient Glycemic Control Team Team Pager [phone] (8am-5pm) 06/18/2024 8:07 AM

## 2024-06-18 NOTE — Plan of Care (Signed)
" °  Problem: Clinical Measurements: Goal: Cardiovascular complication will be avoided Outcome: Progressing   Problem: Coping: Goal: Level of anxiety will decrease Outcome: Progressing   Problem: Elimination: Goal: Will not experience complications related to bowel motility Outcome: Progressing   Problem: Safety: Goal: Ability to remain free from injury will improve Outcome: Progressing   Problem: Metabolic: Goal: Ability to maintain appropriate glucose levels will improve Outcome: Progressing   Problem: Education: Goal: Knowledge of General Education information will improve Description: Including pain rating scale, medication(s)/side effects and non-pharmacologic comfort measures Outcome: Not Progressing   Problem: Health Behavior/Discharge Planning: Goal: Ability to manage health-related needs will improve Outcome: Not Progressing   Problem: Clinical Measurements: Goal: Ability to maintain clinical measurements within normal limits will improve Outcome: Not Progressing   Problem: Activity: Goal: Risk for activity intolerance will decrease Outcome: Not Progressing   Problem: Nutrition: Goal: Adequate nutrition will be maintained Outcome: Not Progressing   Problem: Fluid Volume: Goal: Ability to maintain a balanced intake and output will improve Outcome: Not Progressing   Problem: Health Behavior/Discharge Planning: Goal: Ability to identify and utilize available resources and services will improve Outcome: Not Progressing Goal: Ability to manage health-related needs will improve Outcome: Not Progressing   "

## 2024-06-18 NOTE — Progress Notes (Signed)
 Initial Nutrition Assessment  DOCUMENTATION CODES:   Not applicable  INTERVENTION:   If patient does not extubate:  Vital 1.5@50ml /hr- Initiate at 60ml/hr and increase by 10ml/hr q 8 hours until goal rate is reached.   Free water  flushes 30ml q4 hours to maintain tube patency   Regimen provides 1800kcal/day, 81g/day protein and 1025ml/day of free water .   Pt at high refeed risk; recommend monitor potassium, magnesium  and phosphorus labs daily until stable  Thiamine  100mg  daily via tube x 7 days   Daily weights  NUTRITION DIAGNOSIS:   Inadequate oral intake related to inability to eat (pt sedated and ventilated) as evidenced by NPO status.  GOAL:   Provide needs based on ASPEN/SCCM guidelines  MONITOR:   Vent status, Labs, Weight trends, TF tolerance, Skin, I & O's  REASON FOR ASSESSMENT:   Consult Enteral/tube feeding initiation and management  ASSESSMENT:   34 y.o. female with medical history significant for NICM, CHF, DVT, HTN, esophageal ulcer, PEA arrest, C. Diff, CKD III, type 1 diabetes mellitus (brittle), peripheral neuropathy, suspected EPI (not tested), anxiety, depression, hypertension, severe malnutrition, diabetic gastroparesis requiring J-tube (s/p open placement 06/13/22, dislodged 06/2023), GERD, autoimmune thyroid  disease, celiac disease (reported/neg on biopsy), anemia, autoimmune polyendocrine syndrome type 2, adrenal insufficiency, hyperemesis in pregnancy and shingles who is admitted with AMS and hypoglycemia requiring intubation for airway protection.  RD working remotely.  Pt is well known to this RD from numerous previous admissions. Pt with h/o severe gastroparesis requiring prior surgical J-tube placement and nutrition support. Pt with a h/o non-compliance with tube feeds and medications. Pt also with brittle diabetes. Feeding tube was dislodged in February 2025 and pt refused to have it replaced. Pt's last gastric emptying study (2 hour) from  12/2022 showed no delayed emptying. Per chart, pt appears weight stable since having her feeding tube removed. Pt with a h/o malnutrition but unable to diagnose at this time. RD will follow up to obtain NFPE.   Pt currently sedated and ventilated. OGT in place. Will plan to initiate tube feeds today if patient does not extubate. Pt is at high refeed risk.     Medications reviewed and include: azithromycin , heparin , solu-cortef , insulin , synthroid , protonix , miralax , senokot, lokelma , Ca gluconate, ceftriaxone , LRS w/ 5% dextrose  @125ml /hr, insulin , levophed   Labs reviewed: K 5.4(H), BUN 38(H), creat 2.10(H), P 3.6 wnl, Mg 1.8 wnl Wbc- 28.1(H), Hgb 8.1(L), Hct 25.4(L) Cbgs- 168, 207, 344, 346, 341 x 24 hrs AIC 7.1(H)- 1/4  Patient is currently intubated on ventilator support MV: 8.1 L/min Temp (24hrs), Avg:98.4 F (36.9 C), Min:91.2 F (32.9 C), Max:102.6 F (39.2 C)  Propofol : none   MAP >17mmHg   UOP-   NUTRITION - FOCUSED PHYSICAL EXAM: Unable to perform at this time   Diet Order:   Diet Order             Diet NPO time specified  Diet effective now                  EDUCATION NEEDS:   No education needs have been identified at this time  Skin:  Skin Assessment: Reviewed RN Assessment  Last BM:  1/25- type 6  Height:   Ht Readings from Last 1 Encounters:  05/27/24 5' 1 (1.549 m)    Weight:   Wt Readings from Last 1 Encounters:  06/18/24 55.2 kg    Ideal Body Weight:  47.7 kg  BMI:  Body mass index is 22.99 kg/m.  Estimated Nutritional  Needs:   Kcal:  1732kcal/day  Protein:  85-95g/day  Fluid:  1.7-1.9L/day  Augustin Shams MS, RD, LDN If unable to be reached, please send secure chat to RD inpatient available from 8:00a-4:00p daily

## 2024-06-18 NOTE — Procedures (Signed)
 Arterial Catheter Insertion Procedure Note  ADDALYNE VANDEHEI  [id number]  [birth year]  Date:06/18/24  Time:7:45 AM    Provider Performing: Almarie DELENA Nose   Procedure: Insertion of Arterial Line (63379) with US  guidance (23062)   Indication(s) Blood pressure monitoring and/or need for frequent ABGs  Consent Risks of the procedure as well as the alternatives and risks of each were explained to the patient and/or caregiver.  Consent for the procedure was obtained and is signed in the bedside chart  Anesthesia None  Time Out Verified patient identification, verified procedure, site/side was marked, verified correct patient position, special equipment/implants available, medications/allergies/relevant history reviewed, required imaging and test results available.  Sterile Technique Maximal sterile technique including full sterile barrier drape, hand hygiene, sterile gown, sterile gloves, mask, hair covering, sterile ultrasound probe cover (if used).  Procedure Description Area of catheter insertion was cleaned with chlorhexidine  and draped in sterile fashion. With real-time ultrasound guidance an arterial catheter was placed into the right femoral artery.  Appropriate arterial tracings confirmed on monitor.    Complications/Tolerance None; patient tolerated the procedure well.  EBL Minimal  Specimen(s) None     Almarie Nose DNP, CCRN, FNP-C, AGACNP-BC Acute Care & Family Nurse Practitioner  Pulmonary & Critical Care Medicine PCCM on call pager [phone]

## 2024-06-19 ENCOUNTER — Inpatient Hospital Stay

## 2024-06-19 ENCOUNTER — Telehealth: Payer: Self-pay | Admitting: Oncology

## 2024-06-19 DIAGNOSIS — E1011 Type 1 diabetes mellitus with ketoacidosis with coma: Secondary | ICD-10-CM | POA: Diagnosis not present

## 2024-06-19 DIAGNOSIS — G928 Other toxic encephalopathy: Secondary | ICD-10-CM | POA: Diagnosis not present

## 2024-06-19 DIAGNOSIS — E10649 Type 1 diabetes mellitus with hypoglycemia without coma: Secondary | ICD-10-CM | POA: Diagnosis not present

## 2024-06-19 DIAGNOSIS — J9601 Acute respiratory failure with hypoxia: Secondary | ICD-10-CM | POA: Diagnosis not present

## 2024-06-19 LAB — TRIGLYCERIDES: Triglycerides: 63 mg/dL

## 2024-06-19 LAB — PROTEIN AND GLUCOSE, CSF
Glucose, CSF: 140 mg/dL — ABNORMAL HIGH (ref 40–70)
Total  Protein, CSF: 31 mg/dL (ref 15–45)

## 2024-06-19 LAB — CBC
HCT: 19.7 % — ABNORMAL LOW (ref 36.0–46.0)
HCT: 24.3 % — ABNORMAL LOW (ref 36.0–46.0)
Hemoglobin: 6.3 g/dL — ABNORMAL LOW (ref 12.0–15.0)
Hemoglobin: 8.4 g/dL — ABNORMAL LOW (ref 12.0–15.0)
MCH: 29.7 pg (ref 26.0–34.0)
MCH: 30.4 pg (ref 26.0–34.0)
MCHC: 32 g/dL (ref 30.0–36.0)
MCHC: 34.6 g/dL (ref 30.0–36.0)
MCV: 92.9 fL (ref 80.0–100.0)
MCV: 88 fL (ref 80.0–100.0)
Platelets: 129 10*3/uL — ABNORMAL LOW (ref 150–400)
Platelets: 118 10*3/uL — ABNORMAL LOW (ref 150–400)
RBC: 2.12 MIL/uL — ABNORMAL LOW (ref 3.87–5.11)
RBC: 2.76 MIL/uL — ABNORMAL LOW (ref 3.87–5.11)
RDW: 12.8 % (ref 11.5–15.5)
RDW: 13.3 % (ref 11.5–15.5)
WBC: 16 10*3/uL — ABNORMAL HIGH (ref 4.0–10.5)
WBC: 14 10*3/uL — ABNORMAL HIGH (ref 4.0–10.5)
nRBC: 0 % (ref 0.0–0.2)
nRBC: 0 % (ref 0.0–0.2)

## 2024-06-19 LAB — MENINGITIS/ENCEPHALITIS PANEL (CSF)

## 2024-06-19 LAB — CSF CELL COUNT WITH DIFFERENTIAL
Eosinophils, CSF: 0 %
Lymphs, CSF: 10 %
Monocyte-Macrophage-Spinal Fluid: 6 %
RBC Count, CSF: 0 /cu mm (ref 0–3)
Segmented Neutrophils-CSF: 84 %
Tube #: 3
WBC, CSF: 41 /cu mm (ref 0–5)

## 2024-06-19 LAB — GLUCOSE, CAPILLARY
Glucose-Capillary: 173 mg/dL — ABNORMAL HIGH (ref 70–99)
Glucose-Capillary: 192 mg/dL — ABNORMAL HIGH (ref 70–99)
Glucose-Capillary: 198 mg/dL — ABNORMAL HIGH (ref 70–99)
Glucose-Capillary: 204 mg/dL — ABNORMAL HIGH (ref 70–99)
Glucose-Capillary: 216 mg/dL — ABNORMAL HIGH (ref 70–99)
Glucose-Capillary: 271 mg/dL — ABNORMAL HIGH (ref 70–99)
Glucose-Capillary: 274 mg/dL — ABNORMAL HIGH (ref 70–99)

## 2024-06-19 LAB — BASIC METABOLIC PANEL WITH GFR
Anion gap: 12 (ref 5–15)
BUN: 37 mg/dL — ABNORMAL HIGH (ref 6–20)
CO2: 20 mmol/L — ABNORMAL LOW (ref 22–32)
Calcium: 7.6 mg/dL — ABNORMAL LOW (ref 8.9–10.3)
Chloride: 106 mmol/L (ref 98–111)
Creatinine, Ser: 1.95 mg/dL — ABNORMAL HIGH (ref 0.44–1.00)
GFR, Estimated: 34 mL/min — ABNORMAL LOW
Glucose, Bld: 229 mg/dL — ABNORMAL HIGH (ref 70–99)
Potassium: 4.4 mmol/L (ref 3.5–5.1)
Sodium: 138 mmol/L (ref 135–145)

## 2024-06-19 LAB — PHOSPHORUS: Phosphorus: 2.8 mg/dL (ref 2.5–4.6)

## 2024-06-19 LAB — MAGNESIUM
Magnesium: 1.6 mg/dL — ABNORMAL LOW (ref 1.7–2.4)
Magnesium: 2.5 mg/dL — ABNORMAL HIGH (ref 1.7–2.4)

## 2024-06-19 LAB — URINE CULTURE: Culture: >=100000 — AB

## 2024-06-19 LAB — HEMOGLOBIN AND HEMATOCRIT, BLOOD
HCT: 24.3 % — ABNORMAL LOW (ref 36.0–46.0)
Hemoglobin: 8.2 g/dL — ABNORMAL LOW (ref 12.0–15.0)

## 2024-06-19 LAB — PREPARE RBC (CROSSMATCH)

## 2024-06-19 MED ORDER — FENTANYL CITRATE (PF) 50 MCG/ML IJ SOSY
PREFILLED_SYRINGE | INTRAMUSCULAR | Status: AC
  Administered 2024-06-19: 50 ug via INTRAVENOUS
  Filled 2024-06-19: qty 1

## 2024-06-19 MED ORDER — FENTANYL CITRATE (PF) 50 MCG/ML IJ SOSY
50.0000 ug | PREFILLED_SYRINGE | INTRAMUSCULAR | Status: DC | PRN
  Administered 2024-06-20 (×2): 50 ug via INTRAVENOUS
  Filled 2024-06-19 (×2): qty 1

## 2024-06-19 MED ORDER — CHLORHEXIDINE GLUCONATE CLOTH 2 % EX PADS
6.0000 | MEDICATED_PAD | Freq: Every day | CUTANEOUS | Status: AC
  Administered 2024-06-19 – 2024-06-29 (×11): 6 via TOPICAL

## 2024-06-19 MED ORDER — LACTATED RINGERS IV SOLN
INTRAVENOUS | Status: AC
  Administered 2024-06-19: 125 mL/h via INTRAVENOUS

## 2024-06-19 MED ORDER — PANTOPRAZOLE SODIUM 40 MG IV SOLR
40.0000 mg | Freq: Two times a day (BID) | INTRAVENOUS | Status: AC
  Administered 2024-06-19 – 2024-06-29 (×22): 40 mg via INTRAVENOUS
  Filled 2024-06-19 (×22): qty 10

## 2024-06-19 MED ORDER — VANCOMYCIN HCL 750 MG/150ML IV SOLN
750.0000 mg | INTRAVENOUS | Status: DC
  Administered 2024-06-19 – 2024-06-21 (×3): 750 mg via INTRAVENOUS
  Filled 2024-06-19 (×3): qty 150

## 2024-06-19 MED ORDER — SODIUM CHLORIDE 0.9 % IV SOLN
1.0000 g | Freq: Once | INTRAVENOUS | Status: AC
  Administered 2024-06-19: 1 g via INTRAVENOUS
  Filled 2024-06-19: qty 10

## 2024-06-19 MED ORDER — LIDOCAINE 1 % OPTIME INJ - NO CHARGE
10.0000 mL | Freq: Once | INTRAMUSCULAR | Status: AC
  Administered 2024-06-19: 5 mL
  Filled 2024-06-19: qty 10

## 2024-06-19 MED ORDER — LACTATED RINGERS IV SOLN: INTRAVENOUS | Status: DC

## 2024-06-19 MED ORDER — SODIUM CHLORIDE 0.9% IV SOLUTION: Freq: Once | INTRAVENOUS | Status: AC

## 2024-06-19 MED ORDER — SODIUM CHLORIDE 0.9 % IV SOLN
2.0000 g | Freq: Two times a day (BID) | INTRAVENOUS | Status: DC
  Administered 2024-06-19 – 2024-06-21 (×5): 2 g via INTRAVENOUS
  Filled 2024-06-19 (×5): qty 20

## 2024-06-19 MED ORDER — DEXTROSE 5 % IV SOLN
10.0000 mg/kg | Freq: Two times a day (BID) | INTRAVENOUS | Status: DC
  Administered 2024-06-19 – 2024-06-20 (×3): 550 mg via INTRAVENOUS
  Filled 2024-06-19 (×5): qty 11

## 2024-06-19 MED ORDER — MAGNESIUM SULFATE 2 GM/50ML IV SOLN
2.0000 g | Freq: Once | INTRAVENOUS | Status: AC
  Administered 2024-06-19: 2 g via INTRAVENOUS
  Filled 2024-06-19: qty 50

## 2024-06-19 NOTE — Progress Notes (Signed)
 Attempted WUA on pt, turning off sedation. Pt became HTN with SBP in 170s. Coughing and gagging on tube. Increased anxiety and desatting. Dr. Isadora notified. Per order pt placed back on sedation and PRN medication administered.

## 2024-06-19 NOTE — Procedures (Signed)
 Technically successful L3-L4 lumbar puncture. Please see full dictation under the imaging tab in Epic.  Avon Molock, AGACNP-BC 06/19/2024, 2:44 PM

## 2024-06-19 NOTE — Plan of Care (Signed)
  Problem: Clinical Measurements: Goal: Cardiovascular complication will be avoided Outcome: Progressing   Problem: Nutrition: Goal: Adequate nutrition will be maintained Outcome: Progressing   Problem: Elimination: Goal: Will not experience complications related to bowel motility Outcome: Progressing Goal: Will not experience complications related to urinary retention Outcome: Progressing   Problem: Education: Goal: Knowledge of General Education information will improve Description: Including pain rating scale, medication(s)/side effects and non-pharmacologic comfort measures Outcome: Not Progressing   Problem: Health Behavior/Discharge Planning: Goal: Ability to manage health-related needs will improve Outcome: Not Progressing

## 2024-06-19 NOTE — Plan of Care (Signed)

## 2024-06-19 NOTE — Progress Notes (Signed)
 Pt was placed on the transport vent at approx 1400 and was transported to fluro radiology for a lumbar puncture.After procedure pt was transported back to her ICU room.  There were no issues or concerns during transport or during procedure

## 2024-06-19 NOTE — Progress Notes (Addendum)
 PHARMACY CONSULT NOTE - FOLLOW UP  Pharmacy Consult for Electrolyte Monitoring and Replacement   Recent Labs: Potassium (mmol/L)  Date Value  06/19/2024 4.4  03/01/2013 4.1   Magnesium  (mg/dL)  Date Value  98/72/7973 1.6 (L)   Calcium  (mg/dL)  Date Value  98/72/7973 7.6 (L)   Calcium , Total (mg/dL)  Date Value  89/90/7985 9.1   Albumin  (g/dL)  Date Value  98/74/7973 3.5  12/21/2023 3.3 (L)  03/01/2013 3.9   Phosphorus (mg/dL)  Date Value  98/72/7973 2.8   Sodium (mmol/L)  Date Value  06/19/2024 138  12/21/2023 145 (H)  03/01/2013 133 (L)     Assessment: 34 y.o. female with medical history significant for NICM, CHF, DVT, HTN, esophageal ulcer, PEA arrest, C. Diff, CKD III, type 1 diabetes mellitus (brittle), peripheral neuropathy, suspected EPI (not tested), anxiety, depression, hypertension, severe malnutrition, diabetic gastroparesis requiring J-tube (s/p open placement 06/13/22, dislodged 06/2023), GERD, autoimmune thyroid  disease, celiac disease (reported/neg on biopsy), anemia, autoimmune polyendocrine syndrome type 2, adrenal insufficiency, hyperemesis in pregnancy and shingles who is admitted with AMS and hypoglycemia requiring intubation for airway protection. Pharmacy is asked to follow and replace electrolytes while in CCU  Goal of Therapy:  Electrolytes WNL  Plan:  -- magnesium  sulfate 2g IV x1 -- check electrolytes with AM labs  Belvie Macintosh, PharmD Candidate 06/19/2024 7:39 AM

## 2024-06-19 NOTE — Progress Notes (Addendum)
 Nutrition Follow Up Note   DOCUMENTATION CODES:   Not applicable  INTERVENTION:   Continue Vital 1.5@50ml /hr  Free water  flushes 30ml q4 hours to maintain tube patency   Regimen provides 1800kcal/day, 81g/day protein and 1065ml/day of free water .   Pt remains at high refeed risk; recommend monitor potassium, magnesium  and phosphorus labs daily until stable  Thiamine  100mg  daily via tube x 7 days   Daily weights  NUTRITION DIAGNOSIS:   Inadequate oral intake related to inability to eat (pt sedated and ventilated) as evidenced by NPO status. -ongoing   GOAL:   Provide needs based on ASPEN/SCCM guidelines -progressing   MONITOR:   Vent status, Labs, Weight trends, TF tolerance, Skin, I & O's  ASSESSMENT:   34 y.o. female with medical history significant for NICM, CHF, DVT, HTN, esophageal ulcer, PEA arrest, C. Diff, CKD III, type 1 diabetes mellitus (brittle), peripheral neuropathy, suspected EPI (not tested), anxiety, depression, hypertension, severe malnutrition, diabetic gastroparesis requiring J-tube (s/p open placement 06/13/22, dislodged 06/2023), GERD, autoimmune thyroid  disease, celiac disease (reported/neg on biopsy), anemia, autoimmune polyendocrine syndrome type 2, adrenal insufficiency, hyperemesis in pregnancy and shingles who is admitted with AMS and hypoglycemia requiring intubation for airway protection.  Pt s/p LP today   Pt remains sedated and ventilated. OGT in place and pt is tolerating tube feeds well at 61ml/hr. Pt remains at refeed risk. No BM since 1/25; pt is on bowel regimen. Per chart, pt is up ~7lbs since admission. Pt +5.4L on her I & Os.   Medications reviewed and include: azithromycin , heparin , solu-cortef , insulin , synthroid , protonix , miralax , senokot, thiamine , Ca gluconate, ceftriaxone , LRS @125ml /hr, insulin , levophed , propofol , vancomycin    Labs reviewed: K 4.4 wnl, BUN 37(H), creat 1.95(H), P 2.8 wnl, Mg 2.5(H) Wbc- 14.0(H), Hgb 8.4(L),  Hct 24.3(L) Cbgs- 198, 204, 192 x 24 hrs  Patient is currently intubated on ventilator support MV: 7.4 L/min Temp (24hrs), Avg:98 F (36.7 C), Min:95.2 F (35.1 C), Max:98.8 F (37.1 C)  Propofol : 8.28 ml/hr- provides 219kcal/day   MAP >49mmHg   UOP-   NUTRITION - FOCUSED PHYSICAL EXAM:  Flowsheet Row Most Recent Value  Orbital Region No depletion  Upper Arm Region No depletion  Thoracic and Lumbar Region No depletion  Buccal Region No depletion  Temple Region No depletion  Clavicle Bone Region Mild depletion  Clavicle and Acromion Bone Region Mild depletion  Scapular Bone Region No depletion  Dorsal Hand No depletion  Patellar Region No depletion  Anterior Thigh Region No depletion  Posterior Calf Region No depletion  Edema (RD Assessment) None  Hair Reviewed  Eyes Reviewed  Mouth Reviewed  Skin Reviewed  Nails Reviewed   Diet Order:   Diet Order             Diet NPO time specified  Diet effective now                  EDUCATION NEEDS:   No education needs have been identified at this time  Skin:  Skin Assessment: Reviewed RN Assessment  Last BM:  1/25- type 6  Height:   Ht Readings from Last 1 Encounters:  06/19/24 5' 0.98 (1.549 m)    Weight:   Wt Readings from Last 1 Encounters:  06/19/24 58.5 kg    Ideal Body Weight:  47.7 kg  BMI:  Body mass index is 24.38 kg/m.  Estimated Nutritional Needs:   Kcal:  1732kcal/day  Protein:  85-95g/day  Fluid:  1.7-1.9L/day  Ashley Shams  MS, RD, LDN If unable to be reached, please send secure chat to RD inpatient available from 8:00a-4:00p daily

## 2024-06-19 NOTE — Progress Notes (Signed)
 Per pt's mother, okay to update patient's roommate.

## 2024-06-19 NOTE — Progress Notes (Signed)
 Pharmacy Antibiotic Note  Ashley Camacho is a 34 y.o. female admitted on 06/17/2024 with meningitis.  Pharmacy has been consulted for Vancomycin , Acyclovir  dosing.  Plan: Acyclovir  550 mg (10 mg/kg TBW) IV Q12H to start on 1/27 @ 0100.  Vancomycin  750 mg IV Q24H to start on 1/17 @ 0100. - use nomogram to dose vanc instead of AUC  - goal trough :  15 - 20 mcg/mL   Weight: 55.2 kg (121 lb 11.1 oz)  Temp (24hrs), Avg:98 F (36.7 C), Min:95.2 F (35.1 C), Max:100.8 F (38.2 C)  Recent Labs  Lab 06/13/24 1606 06/17/24 1600 06/17/24 1820 06/17/24 1929 06/18/24 0440 06/18/24 0602 06/18/24 0700 06/18/24 1100 06/18/24 1448 06/18/24 1935  WBC 8.0 5.8  --   --  28.1*  --   --   --   --   --   CREATININE  --  1.40*  --    < > 1.93* 2.05* 2.10* 2.00* 2.02* 1.98*  LATICACIDVEN  --  2.1* 1.9  --  1.4  --   --   --   --   --    < > = values in this interval not displayed.    Estimated Creatinine Clearance: 30.5 mL/min (A) (by C-G formula based on SCr of 1.98 mg/dL (H)).    Allergies[1]  Antimicrobials this admission:   >>    >>   Dose adjustments this admission:   Microbiology results:  BCx:   UCx:    Sputum:    MRSA PCR:   Thank you for allowing pharmacy to be a part of this patients care.  Braysen Cloward D 06/19/2024 1:42 AM     [1] No Known Allergies

## 2024-06-19 NOTE — Telephone Encounter (Signed)
 Patient left a voicemail. That she needs to see Dr. Babara and get a injection. Pt is currently hospitalized.

## 2024-06-19 NOTE — Progress Notes (Signed)
 "  NAME:  Ashley Camacho, MRN:  [id number], DOB:  Jun 23, 1990, LOS: 2 ADMISSION DATE:  06/17/2024, CONSULTATION DATE: 06/17/2024 REFERRING MD: Dr. Floy, CHIEF COMPLAINT: Unresponsiveness    History of Present Illness:  34 y/o female with a medical history as indicated below whop presented to the ED via EMS after being found unresponsive. History is obtained from ED records, EMS records and from speaking with patient's mother. Per EMS records, patient was found unresponsive by her friend. Total downtime is uncertain. Blood glucose with EMS was 27 mg/dl. She was given an amp of glucagon  and blood glucose improved to 44 mg/dl.   Upon arrival in the ED, patient was still unresponsive, hypothermic, hypoxic on 3L [place] and pupils were dilated. An I/O was placed in her right leg. She given an amp of D50. Glucose improved to 252 mg/dl. Despite increase in blood glucose, she remained unresponsive and hypoxic hence she was emergently intubated. PCCM consulted for further management.  ED work-up: CT head negative. Chest x-ray shows left lower lobe atelectasis. Labs pending   Of note, patient was diagnosed with influenza A on 05/27/24 and was hospitalized from 1/4-05/30/2024. Per patient's mother, she is a very fragile diabetic. She states that she has been buying groceries for patient because she is not able to afford food. Patient lives with friends. Unfortunately she does not have the contact information for patient's friend who called EMS or the friends with whom she live  Pertinent  Medical History   Past Medical History:  Diagnosis Date   Acute metabolic encephalopathy 06/10/2022   Adrenal insufficiency    Anemia    Anxiety    Blood transfusion without reported diagnosis    CHF (congestive heart failure) (HCC)    Chronic kidney disease    Depression    Gastroparesis    Hypertension    Hypotension    Intractable nausea and vomiting 11/13/2022   Migraine    Thyroid  disease    Type 1 diabetes (HCC)     Micro Data:  01/25: MRSA PCR>>negative 01/25: Urine>>klebsiella pneumoniae  01/25: Blood x2>>NGTD 01/25: RVP>>negative  01/25: Tracheal aspirate>> 01/26: COVID>>negative   Anti-infectives (From admission, onward)    Start     Dose/Rate Route Frequency Ordered Stop   06/19/24 1000  cefTRIAXone  (ROCEPHIN ) 2 g in sodium chloride  0.9 % 100 mL IVPB        2 g 200 mL/hr over 30 Minutes Intravenous Every 12 hours 06/19/24 0008     06/19/24 0130  acyclovir  (ZOVIRAX ) 550 mg in dextrose  5 % 100 mL IVPB        10 mg/kg  55.2 kg 111 mL/hr over 60 Minutes Intravenous Every 12 hours 06/19/24 0039     06/19/24 0130  vancomycin  (VANCOREADY) IVPB 750 mg/150 mL        750 mg 150 mL/hr over 60 Minutes Intravenous Every 24 hours 06/19/24 0042     06/19/24 0115  cefTRIAXone  (ROCEPHIN ) 1 g in sodium chloride  0.9 % 100 mL IVPB        1 g 200 mL/hr over 30 Minutes Intravenous  Once 06/19/24 0018 06/19/24 0127   06/17/24 1700  cefTRIAXone  (ROCEPHIN ) 1 g in sodium chloride  0.9 % 100 mL IVPB  Status:  Discontinued        1 g 200 mL/hr over 30 Minutes Intravenous Every 24 hours 06/17/24 1558 06/19/24 0008   06/17/24 1600  azithromycin  (ZITHROMAX ) tablet 500 mg        500 mg Per  Tube Daily 06/17/24 1558     06/17/24 1600  vancomycin  (VANCOCIN ) IVPB 1000 mg/200 mL premix  Status:  Discontinued        1,000 mg 200 mL/hr over 60 Minutes Intravenous  Once 06/17/24 1558 06/17/24 1952      Significant Hospital Events: Including procedures, antibiotic start and stop dates in addition to other pertinent events   01/25: Admitted with severe hypoglycemia and acute hypoxic respiratory failure  01/26: Pt now with DKA requiring insulin  gtt remains mechanically intubated on minimal vent settings.  Mentation precluding extubation at this time  01/27: Pt transitioned off insulin  gtt 01/26.  MRI Brain negative due to continued acute encephalopathy due to concerns of possible meningitis continuing cetriaxone and added  vancomycin /acyclovir  and pt pending LP today.  Pts hgb 6.3 with no signs of active bleeding receiving 1 unit pRBC's  Interim History / Subjective:  As outlined above under significant events   Objective    Blood pressure (!) 108/55, pulse 81, temperature 98.8 F (37.1 C), resp. rate 18, height 5' 0.98 (1.549 m), weight 58.5 kg, SpO2 100%.    Vent Mode: PRVC FiO2 (%):  [30 %] 30 % Set Rate:  [18 bmp] 18 bmp Vt Set:  [400 mL] 400 mL PEEP:  [5 cmH20] 5 cmH20 Plateau Pressure:  [16 cmH20-20 cmH20] 16 cmH20   Intake/Output Summary (Last 24 hours) at 06/19/2024 0940 Last data filed at 06/19/2024 0847 Gross per 24 hour  Intake 4787.97 ml  Output 550 ml  Net 4237.97 ml   Filed Weights   06/18/24 0500 06/19/24 0500  Weight: 55.2 kg 58.5 kg    Examination: General: Acutely-ill appearing female sedated not following commands, NAD mechanically intubated  HENT: Supple, no JVD  Lungs: Clear throughout, even, non labored  Cardiovascular: Sinus rhythm, s1s2, no m/r/g, 1+ radial/1+ distal pulses, no edema  Abdomen: +BS x4, soft, non distended  Extremities: Normal bulk and tone  Neuro: Sedated, moving all extremities spontaneously but unable to follow commands, bilateral pupils 2 mm sluggish  GU: Indwelling foley catheter in place draining yellow urine   Resolved problem list   Assessment and Plan   #Acute toxic metabolic encephalopathy  #Sedation needs due to mechanical intubation pain/discomfort  #Suspect neuroglycopenia  #Possible meningitis  Hx: Anxiety and depression  Urine drug screen: negative  CT Head: No acute intracranial abnormality. Left maxillary and bilateral ethmoid sinus mucosal thickening with layering secretions, which can be seen with acute sinusitis. EEG 01/26: negative for seizures or definite epileptiform discharges  - Correct metabolic derangements  - MRI Brain negative for acute abnormality; pending LP per IR due to concern for possible meningitis  - Maintain  normoglycemia  - Avoid sedating medications as able  - Maintain RASS goal of 0 to -1 - PAD protocol to maintain RASS goal: propofol  gtt prn  - WUA daily   #Hypotension  #Mildly elevated troponin likely secondary to demand ischemia  Hx: Chronic diastolic CHF and hypotension  - Continuous telemetry monitoring  - Aggressive iv fluid resuscitation and prn levophed  gtt to maintain map >65; currently not requiring vasopressors - Lactic acid normal  - Continue outpatient rosuvastatin   - Hold outpatient antihypertensives and diuretics for now   #Mechanical intubation for airway protection  - Full vent support for now: vent settings reviewed and established - Continue lung protective strategies  - Maintain plateau pressures less than 30 cm H2O - VAP bundle implemented  - SBT once mentation and respiratory status permits  - Intermittent  CXR and ABG's  - Prn bronchodilator therapy   #Acute kidney injury secondary to ATN  #Hyperkalemia~resolved   #Anion gap metabolic acidosis  - Trend BMP and vbg's  - Replace electrolytes as indicated  - Strict I&O's - Avoid nephrotoxic agents as able - Continue LR @125  ml/hr for 1 day  #Klebsiella pneumoniae UTI  #Possible meningitis  - Trend WBC and monitor fever curve  - Follow cultures  - Continue abx as outlined above pending culture results and sensitivities  - Pending LP   #Anemia with no signs of bleeding~worsening   - Trend CBC  - Monitor for s/sx of bleeding  - Hgb 6.3 receiving 1 unit of pRBC's 01/27 - Transfuse for hgb <7 - Subcutaneous heparin  for VTE px will discontinue if he shows signs of bleeding   #Adrenal insufficiency  - Continue solucortef 100 mg q8hrs for now   #Hypothyroidism  - Continue synthroid    #Gastroparesis  - PPI   #Hypoglycemia~resolved #DKA~resolved   - Transitioned off insulin  gtt 01/27 - SSI and scheduled lantus   - Target CBG readings 140 to 180 - Follow hyper/hypoglycemic protocol  - Diabetes  coordinator consulted appreciate input   Labs   CBC: Recent Labs  Lab 06/13/24 1606 06/17/24 1600 06/18/24 0440 06/19/24 0350  WBC 8.0 5.8 28.1* 16.0*  NEUTROABS 7.0 4.8  --   --   HGB 8.1* 8.4* 8.1* 6.3*  HCT 25.0* 25.4* 25.4* 19.7*  MCV 94.3 91.4 94.8 92.9  PLT 142* 84* 222 129*    Basic Metabolic Panel: Recent Labs  Lab 06/18/24 0440 06/18/24 0602 06/18/24 0700 06/18/24 1100 06/18/24 1448 06/18/24 1935 06/19/24 0350  NA 136   < > 137 139 138 136 138  K 7.1*   < > 5.4* 4.9 5.1 5.3* 4.4  CL 103   < > 105 109 109 107 106  CO2 12*   < > 12* 18* 20* 19* 20*  GLUCOSE 443*   < > 371* 154* 130* 205* 229*  BUN 38*   < > 38* 37* 36* 36* 37*  CREATININE 1.93*   < > 2.10* 2.00* 2.02* 1.98* 1.95*  CALCIUM  8.1*   < > 7.9* 7.8* 8.2* 7.7* 7.6*  MG 1.8  --   --   --   --   --  1.6*  PHOS 3.6  --   --   --   --   --  2.8   < > = values in this interval not displayed.   GFR: Estimated Creatinine Clearance: 33.7 mL/min (A) (by C-G formula based on SCr of 1.95 mg/dL (H)). Recent Labs  Lab 06/13/24 1606 06/17/24 1600 06/17/24 1820 06/18/24 0440 06/19/24 0350  WBC 8.0 5.8  --  28.1* 16.0*  LATICACIDVEN  --  2.1* 1.9 1.4  --     Liver Function Tests: Recent Labs  Lab 06/17/24 1600  AST 32  ALT 11  ALKPHOS 59  BILITOT 0.3  PROT 5.9*  ALBUMIN  3.5   No results for input(s): LIPASE, AMYLASE in the last 168 hours. No results for input(s): AMMONIA in the last 168 hours.  ABG    Component Value Date/Time   PHART 7.21 (L) 06/18/2024 0409   PCO2ART 27 (L) 06/18/2024 0409   PO2ART 153 (H) 06/18/2024 0409   HCO3 16.9 (L) 06/18/2024 1100   ACIDBASEDEF 8.3 (H) 06/18/2024 1100   O2SAT 91.8 06/18/2024 1100     Coagulation Profile: No results for input(s): INR, PROTIME in the last 168 hours.  Cardiac Enzymes: No results for input(s): CKTOTAL, CKMB, CKMBINDEX, TROPONINI in the last 168 hours.  HbA1C: Hgb A1c MFr Bld  Date/Time Value Ref Range Status   05/27/2024 11:28 PM 7.1 (H) 4.8 - 5.6 % Final    Comment:    (NOTE) Diagnosis of Diabetes The following HbA1c ranges recommended by the American Diabetes Association (ADA) may be used as an aid in the diagnosis of diabetes mellitus.  Hemoglobin             Suggested A1C NGSP%              Diagnosis  <5.7                   Non Diabetic  5.7-6.4                Pre-Diabetic  >6.4                   Diabetic  <7.0                   Glycemic control for                       adults with diabetes.    10/09/2023 08:34 PM 8.0 (H) 4.8 - 5.6 % Final    Comment:    (NOTE) Pre diabetes:          5.7%-6.4%  Diabetes:              >6.4%  Glycemic control for   <7.0% adults with diabetes     CBG: Recent Labs  Lab 06/18/24 2131 06/18/24 2229 06/18/24 2323 06/19/24 0302 06/19/24 0749  GLUCAP 192* 190* 219* 192* 204*    Review of Systems:   Unable to assess pt mechanically intubated   Past Medical History:  She,  has a past medical history of Acute metabolic encephalopathy (06/10/2022), Adrenal insufficiency, Anemia, Anxiety, Blood transfusion without reported diagnosis, CHF (congestive heart failure) (HCC), Chronic kidney disease, Depression, Gastroparesis, Hypertension, Hypotension, Intractable nausea and vomiting (11/13/2022), Migraine, Thyroid  disease, and Type 1 diabetes (HCC).   Surgical History:   Past Surgical History:  Procedure Laterality Date   BIOPSY  01/14/2021   Procedure: BIOPSY;  Surgeon: Eda Iha, MD;  Location: [facility] ENDOSCOPY;  Service: Gastroenterology;;   CENTRAL LINE INSERTION N/A 10/10/2023   Procedure: CENTRAL LINE INSERTION;  Surgeon: Marea Selinda RAMAN, MD;  Location: [facility] INVASIVE CV LAB;  Service: Cardiovascular;  Laterality: N/A;   CESAREAN SECTION     x2   CESAREAN SECTION WITH BILATERAL TUBAL LIGATION N/A 02/17/2021   Procedure: CESAREAN SECTION WITH BILATERAL TUBAL LIGATION;  Surgeon: Barbra Lang PARAS, DO;  Location: [facility] LD ORS;  Service:  Obstetrics;  Laterality: N/A;   ESOPHAGOGASTRODUODENOSCOPY (EGD) WITH PROPOFOL  N/A 01/14/2021   Procedure: ESOPHAGOGASTRODUODENOSCOPY (EGD) WITH PROPOFOL ;  Surgeon: Eda Iha, MD;  Location: [facility] ENDOSCOPY;  Service: Gastroenterology;  Laterality: N/A;   IR BONE MARROW BIOPSY & ASPIRATION  12/16/2022   IR REPLC DUODEN/JEJUNO TUBE PERCUT W/FLUORO  11/22/2022   JEJUNOSTOMY N/A 06/13/2022   Procedure: DEXTER;  Surgeon: Jordis Laneta FALCON, MD;  Location: [facility] ORS;  Service: General;  Laterality: N/A;   MOUTH SURGERY     TEE WITHOUT CARDIOVERSION N/A 01/06/2024   Procedure: ECHOCARDIOGRAM, TRANSESOPHAGEAL;  Surgeon: Rolan Ezra RAMAN, MD;  Location: [facility] ORS;  Service: Cardiovascular;  Laterality: N/A;   TUBAL LIGATION       Social History:   reports that  she has never smoked. She has never been exposed to tobacco smoke. She has never used smokeless tobacco. She reports that she does not drink alcohol and does not use drugs.   Family History:  Her family history includes ADD / ADHD in her daughter; Breast cancer in her mother; CAD in her father, paternal grandfather, and paternal grandmother; Cancer in her mother; Diabetes in her father, maternal uncle, paternal grandfather, and paternal grandmother; Diabetes type I in her father; Early death in her father; Lung cancer in her maternal grandfather; Seizures in her mother. There is no history of Ovarian cancer.   Allergies Allergies[1]   Home Medications  Prior to Admission medications  Medication Sig Start Date End Date Taking? Authorizing Provider  acetaminophen  (TYLENOL ) 500 MG tablet Take 2 tablets (1,000 mg total) by mouth every 6 (six) hours as needed for headache, fever or moderate pain. 08/05/22  Yes Dorinda Drue DASEN, MD  hydrocortisone  (CORTEF ) 10 MG tablet Take 2 tablets (20 mg total) by mouth daily with breakfast AND 1 tablet (10 mg total) daily with supper. 05/27/23  Yes Sreenath, Sudheer B, MD  insulin  aspart (NOVOLOG ) 100 UNIT/ML  FlexPen Inject 2 Units into the skin 3 (three) times daily with meals. If eating and Blood Glucose (BG) 80 or higher inject 2 units for meal coverage and add correction dose per scale. If not eating, correction dose only. BG <150= 0 unit; BG 150-200= 1 unit; BG 201-250= 3 unit; BG 251-300= 5 unit; BG 301-350= 7 unit; BG 351-400= 9 unit; BG >400= 11 unit and Call Primary Care. 10/12/23  Yes [name], MD  Accu-Chek Softclix Lancets lancets Use to check blood sugar up to 6 times daily. May substitute to any manufacturer covered by patient's insurance. 01/19/24   Ostwalt, [name], PA-C  BD PEN NEEDLE MINI ULTRAFINE 31G X 5 MM MISC by Other route. 07/18/23 07/17/24  [provider]  Blood Glucose Monitoring Suppl (BLOOD GLUCOSE MONITOR SYSTEM) w/Device KIT Use to check blood sugar up to 6 times daily. May substitute to any manufacturer covered by patient's insurance. 01/19/24   Ostwalt, [name], PA-C  Continuous Glucose Sensor (DEXCOM G7 SENSOR) MISC Use to check blood sugar. Change every 10 days 01/19/24   Ostwalt, [name], PA-C  furosemide  (LASIX ) 20 MG tablet Take 1 tablet (20 mg total) by mouth daily as needed. 02/17/24 06/13/24  Donette [place] A, FNP  Glucose Blood (BLOOD GLUCOSE TEST STRIPS) STRP Use to check blood sugar up to 6 times daily. May substitute to any manufacturer covered by patient's insurance. 01/19/24   Ostwalt, [name], PA-C  guaiFENesin  (MUCINEX ) 600 MG 12 hr tablet Take 1 tablet (600 mg total) by mouth 2 (two) times daily. Patient not taking: No sig reported 05/30/24   Arlon Carliss ORN, DO  Insulin  Pen Needle (PEN NEEDLES) 31G X 6 MM MISC PEN NEEDLES 31G X 6 MM 05/31/17   [provider]  Lancet Device MISC Use to check blood sugar up to 6 times daily. May substitute to any manufacturer covered by patient's insurance. 01/19/24   Ostwalt, [name], PA-C  levothyroxine  (SYNTHROID ) 75 MCG tablet TAKE 1 TABLET (75 MCG TOTAL) BY MOUTH DAILY AT 6 (SIX) AM. 12/06/23   Ostwalt, [name], PA-C   metoprolol  succinate (TOPROL -XL) 25 MG 24 hr tablet TAKE 1 TABLET (25 MG TOTAL) BY MOUTH DAILY. Patient not taking: Reported on 06/17/2024 03/23/24   Rolan Ezra RAMAN, MD  nortriptyline (PAMELOR) 25 MG capsule Take 25 mg by mouth at bedtime.  [provider]  NOVOLOG  100 UNIT/ML injection Inject 0-50 Units into the skin as needed for high blood sugar. FOR USE IN INSULIN  PUMP. MAX 50 UNITS PER DAY. 04/14/24   [provider]  oseltamivir  (TAMIFLU ) 30 MG capsule Take 1 capsule (30 mg total) by mouth daily. Patient not taking: No sig reported 05/31/24   Arlon Carliss ORN, DO  rosuvastatin  (CRESTOR ) 10 MG tablet Take 1 tablet (10 mg total) by mouth daily. 01/18/24   Donette Ellouise LABOR, FNP  sacubitril -valsartan  (ENTRESTO ) 24-26 MG Take 1 tablet by mouth 2 (two) times daily. 02/28/24   Rolan Ezra RAMAN, MD  sodium zirconium cyclosilicate  (LOKELMA ) 10 g PACK packet Take 10 g by mouth daily. 02/28/24   Rolan Ezra RAMAN, MD    Will update pts mother following LP today   Critical care time: 40 minutes      Lonell Moose, [facility]  Pulmonary/Critical Care Pager [phone] (please enter 7 digits) PCCM Consult Pager [phone] (please enter 7 digits)          [1] No Known Allergies  "

## 2024-06-20 DIAGNOSIS — Z794 Long term (current) use of insulin: Secondary | ICD-10-CM

## 2024-06-20 DIAGNOSIS — G9340 Encephalopathy, unspecified: Secondary | ICD-10-CM

## 2024-06-20 DIAGNOSIS — E1022 Type 1 diabetes mellitus with diabetic chronic kidney disease: Secondary | ICD-10-CM | POA: Diagnosis not present

## 2024-06-20 DIAGNOSIS — E310 Autoimmune polyglandular failure: Secondary | ICD-10-CM

## 2024-06-20 DIAGNOSIS — E1069 Type 1 diabetes mellitus with other specified complication: Secondary | ICD-10-CM

## 2024-06-20 DIAGNOSIS — G928 Other toxic encephalopathy: Secondary | ICD-10-CM | POA: Diagnosis not present

## 2024-06-20 DIAGNOSIS — R836 Abnormal cytological findings in cerebrospinal fluid: Secondary | ICD-10-CM

## 2024-06-20 DIAGNOSIS — N179 Acute kidney failure, unspecified: Secondary | ICD-10-CM

## 2024-06-20 DIAGNOSIS — N189 Chronic kidney disease, unspecified: Secondary | ICD-10-CM | POA: Diagnosis not present

## 2024-06-20 DIAGNOSIS — E1043 Type 1 diabetes mellitus with diabetic autonomic (poly)neuropathy: Secondary | ICD-10-CM | POA: Diagnosis not present

## 2024-06-20 DIAGNOSIS — E10649 Type 1 diabetes mellitus with hypoglycemia without coma: Secondary | ICD-10-CM | POA: Diagnosis not present

## 2024-06-20 DIAGNOSIS — E162 Hypoglycemia, unspecified: Secondary | ICD-10-CM | POA: Diagnosis not present

## 2024-06-20 DIAGNOSIS — E2740 Unspecified adrenocortical insufficiency: Secondary | ICD-10-CM | POA: Diagnosis not present

## 2024-06-20 DIAGNOSIS — J9601 Acute respiratory failure with hypoxia: Secondary | ICD-10-CM | POA: Diagnosis not present

## 2024-06-20 LAB — GLUCOSE, CAPILLARY
Glucose-Capillary: 250 mg/dL — ABNORMAL HIGH (ref 70–99)
Glucose-Capillary: 240 mg/dL — ABNORMAL HIGH (ref 70–99)
Glucose-Capillary: 283 mg/dL — ABNORMAL HIGH (ref 70–99)
Glucose-Capillary: 261 mg/dL — ABNORMAL HIGH (ref 70–99)
Glucose-Capillary: 154 mg/dL — ABNORMAL HIGH (ref 70–99)
Glucose-Capillary: 83 mg/dL (ref 70–99)
Glucose-Capillary: 113 mg/dL — ABNORMAL HIGH (ref 70–99)

## 2024-06-20 LAB — CBC
HCT: 23.5 % — ABNORMAL LOW (ref 36.0–46.0)
Hemoglobin: 8 g/dL — ABNORMAL LOW (ref 12.0–15.0)
MCH: 30.1 pg (ref 26.0–34.0)
MCHC: 34 g/dL (ref 30.0–36.0)
MCV: 88.3 fL (ref 80.0–100.0)
Platelets: 117 10*3/uL — ABNORMAL LOW (ref 150–400)
RBC: 2.66 MIL/uL — ABNORMAL LOW (ref 3.87–5.11)
RDW: 13.5 % (ref 11.5–15.5)
WBC: 9.9 10*3/uL (ref 4.0–10.5)
nRBC: 0 % (ref 0.0–0.2)

## 2024-06-20 LAB — TYPE AND SCREEN
ABO/RH(D): A POS
Antibody Screen: NEGATIVE
Unit division: 0

## 2024-06-20 LAB — BASIC METABOLIC PANEL WITH GFR
Anion gap: 11 (ref 5–15)
BUN: 33 mg/dL — ABNORMAL HIGH (ref 6–20)
CO2: 21 mmol/L — ABNORMAL LOW (ref 22–32)
Calcium: 7.1 mg/dL — ABNORMAL LOW (ref 8.9–10.3)
Chloride: 110 mmol/L (ref 98–111)
Creatinine, Ser: 1.88 mg/dL — ABNORMAL HIGH (ref 0.44–1.00)
GFR, Estimated: 36 mL/min — ABNORMAL LOW
Glucose, Bld: 265 mg/dL — ABNORMAL HIGH (ref 70–99)
Potassium: 3.5 mmol/L (ref 3.5–5.1)
Sodium: 141 mmol/L (ref 135–145)

## 2024-06-20 LAB — BPAM RBC
Blood Product Expiration Date: 202602182359
ISSUE DATE / TIME: 202601270625
Unit Type and Rh: 6200

## 2024-06-20 LAB — PATHOLOGIST SMEAR REVIEW

## 2024-06-20 LAB — MAGNESIUM: Magnesium: 2.2 mg/dL (ref 1.7–2.4)

## 2024-06-20 LAB — PHOSPHORUS: Phosphorus: 2.6 mg/dL (ref 2.5–4.6)

## 2024-06-20 MED ORDER — POTASSIUM & SODIUM PHOSPHATES 280-160-250 MG PO PACK
1.0000 | PACK | Freq: Three times a day (TID) | ORAL | Status: AC
  Administered 2024-06-20 (×2): 1
  Filled 2024-06-20 (×2): qty 1

## 2024-06-20 MED ORDER — HYDROCORTISONE SOD SUC (PF) 100 MG IJ SOLR
50.0000 mg | Freq: Three times a day (TID) | INTRAMUSCULAR | Status: DC
  Administered 2024-06-20 – 2024-06-21 (×3): 50 mg via INTRAVENOUS
  Filled 2024-06-20 (×3): qty 2

## 2024-06-20 MED ORDER — POTASSIUM CHLORIDE 20 MEQ PO PACK: 40.0000 meq | PACK | Freq: Once | ORAL | Status: DC

## 2024-06-20 MED ORDER — HYDRALAZINE HCL 20 MG/ML IJ SOLN
10.0000 mg | INTRAMUSCULAR | Status: AC | PRN
  Administered 2024-06-20 – 2024-06-26 (×5): 10 mg via INTRAVENOUS
  Administered 2024-06-28: 20 mg via INTRAVENOUS
  Filled 2024-06-20 (×6): qty 1

## 2024-06-20 MED ORDER — INSULIN GLARGINE 100 UNIT/ML ~~LOC~~ SOLN: 10.0000 [IU] | Freq: Every day | SUBCUTANEOUS | Status: DC

## 2024-06-20 MED ORDER — INSULIN ASPART 100 UNIT/ML IJ SOLN
0.0000 [IU] | INTRAMUSCULAR | Status: DC
  Administered 2024-06-20: 5 [IU] via SUBCUTANEOUS
  Administered 2024-06-20: 3 [IU] via SUBCUTANEOUS
  Filled 2024-06-20: qty 5
  Filled 2024-06-20: qty 3

## 2024-06-20 MED ORDER — LABETALOL HCL 5 MG/ML IV SOLN
10.0000 mg | INTRAVENOUS | Status: DC | PRN
  Administered 2024-06-20 (×2): 10 mg via INTRAVENOUS
  Filled 2024-06-20 (×3): qty 4

## 2024-06-20 MED ORDER — DEXMEDETOMIDINE HCL IN NACL 400 MCG/100ML IV SOLN
0.0000 ug/kg/h | INTRAVENOUS | Status: DC
  Administered 2024-06-20: 0.4 ug/kg/h via INTRAVENOUS
  Administered 2024-06-20: 0.6 ug/kg/h via INTRAVENOUS
  Administered 2024-06-21: 0.7 ug/kg/h via INTRAVENOUS
  Filled 2024-06-20 (×3): qty 100

## 2024-06-20 MED ORDER — INSULIN ASPART 100 UNIT/ML IJ SOLN
0.0000 [IU] | INTRAMUSCULAR | Status: DC
  Administered 2024-06-20 (×2): 3 [IU] via SUBCUTANEOUS
  Administered 2024-06-20: 8 [IU] via SUBCUTANEOUS
  Administered 2024-06-21: 3 [IU] via SUBCUTANEOUS
  Administered 2024-06-21: 2 [IU] via SUBCUTANEOUS
  Administered 2024-06-21: 3 [IU] via SUBCUTANEOUS
  Administered 2024-06-21: 5 [IU] via SUBCUTANEOUS
  Administered 2024-06-21: 2 [IU] via SUBCUTANEOUS
  Filled 2024-06-20 (×3): qty 3
  Filled 2024-06-20: qty 2
  Filled 2024-06-20: qty 5
  Filled 2024-06-20: qty 3
  Filled 2024-06-20: qty 8
  Filled 2024-06-20: qty 2

## 2024-06-20 MED ORDER — INSULIN GLARGINE 100 UNIT/ML ~~LOC~~ SOLN
10.0000 [IU] | Freq: Every day | SUBCUTANEOUS | Status: DC
  Administered 2024-06-20 – 2024-06-21 (×2): 10 [IU] via SUBCUTANEOUS
  Filled 2024-06-20 (×2): qty 0.1

## 2024-06-20 MED ORDER — HYDRALAZINE HCL 20 MG/ML IJ SOLN
10.0000 mg | Freq: Four times a day (QID) | INTRAMUSCULAR | Status: DC | PRN
  Administered 2024-06-20 (×2): 10 mg via INTRAVENOUS
  Filled 2024-06-20 (×3): qty 1

## 2024-06-20 MED ORDER — POTASSIUM CHLORIDE 20 MEQ PO PACK
20.0000 meq | PACK | Freq: Once | ORAL | Status: AC
  Administered 2024-06-20: 20 meq
  Filled 2024-06-20: qty 1

## 2024-06-20 MED ORDER — LACTATED RINGERS IV SOLN: INTRAVENOUS | Status: DC

## 2024-06-20 NOTE — Consult Note (Addendum)
 NAME: Ashley Camacho  DOB: 1990-05-31  MRN: [id number]  Date/Time: 06/20/2024 12:13 PM  REQUESTING PROVIDER: Dr.Dgayli Subjective:  REASON FOR CONSULT: meninigits ?No hisotry available from patient as she is intubated- chart reviewed- spoke to mother Ashley Camacho is a 34 y.o. 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 Her friend found her on the floor. Pt has brittle diabetes Was in [facility] between 1/4-05/30/24 for influenza A related clinical picture and treated with tamiflu  On 1/21 she saw Hematologist  for anemia . At that visit her BP was low 79/55 and improved to 88/58 after drinking some water  In the ED vitals BP 160/96 and later  06/17/24 15:55  BP 90/52 !  Pulse Rate 105 !  Resp 18 [1]  SpO2 100 %    Latest Reference Range & Units 06/17/24 16:00  WBC 4.0 - 10.5 K/uL 5.8  Hemoglobin 12.0 - 15.0 g/dL 8.4 (L)  HCT 63.9 - 53.9 % 25.4 (L)  Platelets 150 - 400 K/uL 84 (L) [1]  Creatinine 0.44 - 1.00 mg/dL 8.59 (H)   Unresponsive on presentation Was intubated in the ED CT head left maxillary and b/l ethmoid sinus mucosal thickening with layering suggestive of acute sinusitis Pt remained encephalopathic and MRI was done on 1/26 without contrast and it was reported N PT underwent LP on 1/27 and  Latest Reference Range & Units 06/19/24 14:33  Appearance, CSF CLEAR  CLEAR  Glucose, CSF 40 - 70 mg/dL 859 (H)  RBC Count, CSF 0 - 3 /cu mm 0  WBC, CSF 0 - 5 /cu mm 41 (HH)  Segmented Neutrophils-CSF % 84  Lymphs, CSF % 10  Monocyte-Macrophage-Spinal Fluid % 6  Eosinophils, CSF % 0  Color, CSF COLORLESS  COLORLESS  Supernatant  CLEAR  Total  Protein, CSF 15 - 45 mg/dL 31  Tube #  3  Pt received  meningiits dose of antibiotics on the night of 1/26 I am asked to see patient to r/o meningitis    Past Medical History:  Diagnosis Date   Acute metabolic encephalopathy 06/10/2022   Adrenal insufficiency    Anemia    Anxiety    Blood transfusion without reported diagnosis    CHF (congestive heart failure) (HCC)    Chronic kidney disease    Depression    Gastroparesis    Hypertension    Hypotension    Intractable nausea and vomiting 11/13/2022   Migraine    Thyroid  disease    Type 1 diabetes (HCC)     Past Surgical History:  Procedure Laterality Date   BIOPSY  01/14/2021   Procedure: BIOPSY;  Surgeon: Eda Iha, MD;  Location: [facility] ENDOSCOPY;  Service: Gastroenterology;;   CENTRAL LINE INSERTION N/A 10/10/2023   Procedure: CENTRAL LINE INSERTION;  Surgeon: Marea Selinda RAMAN, MD;  Location: [facility] INVASIVE CV LAB;  Service: Cardiovascular;  Laterality: N/A;   CESAREAN SECTION     x2   CESAREAN SECTION WITH BILATERAL TUBAL LIGATION N/A 02/17/2021   Procedure: CESAREAN SECTION WITH BILATERAL TUBAL LIGATION;  Surgeon: Barbra Lang PARAS, DO;  Location: [facility] LD ORS;  Service: Obstetrics;  Laterality: N/A;   ESOPHAGOGASTRODUODENOSCOPY (EGD) WITH PROPOFOL  N/A 01/14/2021   Procedure: ESOPHAGOGASTRODUODENOSCOPY (EGD) WITH PROPOFOL ;  Surgeon: Eda,  Suzen, MD;  Location[facility] ENDOSCOPY;  Service: Gastroenterology;  Laterality: N/A;   IR BONE MARROW BIOPSY & ASPIRATION  12/16/2022   IR REPLC DUODEN/JEJUNO TUBE PERCUT W/FLUORO  11/22/2022   JEJUNOSTOMY N/A 06/13/2022   Procedure: DEXTER;  Surgeon: Jordis Laneta FALCON, MD;  Location: [facility] ORS;  Service: General;  Laterality: N/A;   MOUTH SURGERY     TEE WITHOUT CARDIOVERSION N/A 01/06/2024   Procedure: ECHOCARDIOGRAM, TRANSESOPHAGEAL;  Surgeon: Rolan Ezra RAMAN, MD;  Location: [facility] ORS;  Service: Cardiovascular;  Laterality: N/A;   TUBAL LIGATION      Social History   Socioeconomic History   Marital status: Single    Spouse name: Not  on file   Number of children: Not on file   Years of education: Not on file   Highest education level: 9th grade  Occupational History   Occupation: home maker  Tobacco Use   Smoking status: Never    Passive exposure: Never   Smokeless tobacco: Never  Vaping Use   Vaping status: Never Used  Substance and Sexual Activity   Alcohol use: No   Drug use: No   Sexual activity: Not Currently    Birth control/protection: None  Other Topics Concern   Not on file  Social History Narrative   Not on file   Social Drivers of Health   Tobacco Use: Low Risk (06/13/2024)   Patient History    Smoking Tobacco Use: Never    Smokeless Tobacco Use: Never    Passive Exposure: Never  Financial Resource Strain: Medium Risk (04/11/2024)   Overall Financial Resource Strain (CARDIA)    Difficulty of Paying Living Expenses: Somewhat hard  Food Insecurity: Patient Unable To Answer (06/17/2024)   Epic    Worried About Programme Researcher, Broadcasting/film/video in the Last Year: Patient unable to answer    Ran Out of Food in the Last Year: Patient unable to answer  Recent Concern: Food Insecurity - Food Insecurity Present (05/30/2024)   Epic    Worried About Programme Researcher, Broadcasting/film/video in the Last Year: Sometimes true    Ran Out of Food in the Last Year: Sometimes true  Transportation Needs: Patient Unable To Answer (06/17/2024)   Epic    Lack of Transportation (Medical): Patient unable to answer    Lack of Transportation (Non-Medical): Patient unable to answer  Physical Activity: Insufficiently Active (04/11/2024)   Exercise Vital Sign    Days of Exercise per Week: 3 days    Minutes of Exercise per Session: 20 min  Stress: Stress Concern Present (04/11/2024)   [employer] of Occupational Health - Occupational Stress Questionnaire    Feeling of Stress: Rather much  Social Connections: Socially Isolated (04/11/2024)   Social Connection and Isolation Panel    Frequency of Communication with Friends and Family: More than three  times a week    Frequency of Social Gatherings with Friends and Family: More than three times a week    Attends Religious Services: Never    Database Administrator or Organizations: No    Attends Engineer, Structural: Not on file    Marital Status: Never married  Intimate Partner Violence: Patient Unable To Answer (06/17/2024)   Epic    Fear of Current or Ex-Partner: Patient unable to answer    Emotionally Abused: Patient unable to answer    Physically Abused: Patient unable to answer    Sexually Abused: Patient unable to answer  Depression (PHQ2-9): Medium Risk (06/13/2024)   Depression (  PHQ2-9)    PHQ-2 Score: 6  Alcohol Screen: Low Risk (04/11/2024)   Alcohol Screen    Last Alcohol Screening Score (AUDIT): 1  Housing: Patient Unable To Answer (06/17/2024)   Epic    Unable to Pay for Housing in the Last Year: Patient unable to answer    Number of Times Moved in the Last Year: Not on file    Homeless in the Last Year: Patient unable to answer  Recent Concern: Housing - High Risk (05/30/2024)   Epic    Unable to Pay for Housing in the Last Year: Yes    Number of Times Moved in the Last Year: 1    Homeless in the Last Year: No  Utilities: Patient Unable To Answer (06/17/2024)   Epic    Threatened with loss of utilities: Patient unable to answer  Health Literacy: Adequate Health Literacy (12/21/2023)   B1300 Health Literacy    Frequency of need for help with medical instructions: Never    Family History  Problem Relation Age of Onset   Breast cancer Mother    Seizures Mother    Cancer Mother    Diabetes type I Father    CAD Father    Diabetes Father    Early death Father    Lung cancer Maternal Grandfather    CAD Paternal Grandmother    Diabetes Paternal Grandmother    CAD Paternal Grandfather    Diabetes Paternal Grandfather    ADD / ADHD Daughter    Diabetes Maternal Uncle    Ovarian cancer Neg Hx    Allergies[1] I? Current Facility-Administered Medications   Medication Dose Route Frequency Provider Last Rate Last Admin   acetaminophen  (TYLENOL ) tablet 1,000 mg  1,000 mg Per NG tube Q6H PRN Tukov-Yual, Magdalene S, NP       albuterol  (PROVENTIL ) (2.5 MG/3ML) 0.083% nebulizer solution 2.5 mg  2.5 mg Nebulization Q6H PRN Tukov-Yual, Magdalene S, NP       azithromycin  (ZITHROMAX ) tablet 500 mg  500 mg Per Tube Daily Nazari, Walid A, RPH   500 mg at 06/20/24 1050   cefTRIAXone  (ROCEPHIN ) 2 g in sodium chloride  0.9 % 100 mL IVPB  2 g Intravenous Q12H Rust-Chester, Britton L, NP   Stopped at 06/20/24 1152   Chlorhexidine  Gluconate Cloth 2 % PADS 6 each  6 each Topical Daily Dgayli, Belva, MD   6 each at 06/19/24 2100   dexmedetomidine  (PRECEDEX ) 400 MCG/100ML (4 mcg/mL) infusion  0-1.2 mcg/kg/hr Intravenous Titrated Shellia Mann D, NP 7.91 mL/hr at 06/20/24 1201 0.5 mcg/kg/hr at 06/20/24 1201   dextrose  50 % solution 0-50 mL  0-50 mL Intravenous PRN Kathrene Almarie Bake, NP       feeding supplement (VITAL 1.5 CAL) liquid 1,000 mL  1,000 mL Per Tube Continuous Dgayli, Khabib, MD   Stopped at 06/20/24 1122   fentaNYL  (SUBLIMAZE ) injection 50 mcg  50 mcg Intravenous Q2H PRN Nelson, Dana G, NP   50 mcg at 06/20/24 0835   free water  30 mL  30 mL Per Tube Q4H Dgayli, Khabib, MD   30 mL at 06/20/24 0815   heparin  injection 5,000 Units  5,000 Units Subcutaneous Q8H Tukov-Yual, Magdalene S, NP   5,000 Units at 06/20/24 0518   hydrALAZINE  (APRESOLINE ) injection 10 mg  10 mg Intravenous Q6H PRN Keene, Jeremiah D, NP   10 mg at 06/20/24 1117   hydrocortisone  sodium succinate  (SOLU-CORTEF ) 100 MG injection 50 mg  50 mg Intravenous Q8H Isadora Belva, MD  insulin  aspart (novoLOG ) injection 0-15 Units  0-15 Units Subcutaneous Q4H Isadora Hose, MD       [START ON 06/21/2024] insulin  glargine (LANTUS ) injection 10 Units  10 Units Subcutaneous Daily Dgayli, Hose, MD       labetalol  (NORMODYNE ) injection 10 mg  10 mg Intravenous Q2H PRN Keene, Jeremiah D, NP        levothyroxine  (SYNTHROID ) tablet 75 mcg  75 mcg Per NG tube Q0600 Tukov-Yual, Magdalene S, NP   75 mcg at 06/20/24 0518   norepinephrine  (LEVOPHED ) 4mg  in (0.016 mg/mL) premix infusion  0-40 mcg/min Intravenous Titrated Ouma, Elizabeth Achieng, NP   Stopped at 06/19/24 1646   Oral care mouth rinse  15 mL Mouth Rinse Q2H Dgayli, Hose, MD   15 mL at 06/20/24 1122   Oral care mouth rinse  15 mL Mouth Rinse PRN Isadora Hose, MD       pantoprazole  (PROTONIX ) injection 40 mg  40 mg Intravenous Q12H Rust-Chester, Britton L, NP   40 mg at 06/20/24 1049   polyethylene glycol (MIRALAX  / GLYCOLAX ) packet 17 g  17 g Per Tube Daily PRN Tukov-Yual, Magdalene S, NP       polyethylene glycol (MIRALAX  / GLYCOLAX ) packet 17 g  17 g Per Tube Daily Kathrene Almarie Bake, NP   17 g at 06/19/24 1053   potassium & sodium phosphates  (PHOS-NAK) 280-160-250 MG packet 1 packet  1 packet Per Tube TID AC & HS Chappell, Alex B, RPH   1 packet at 06/20/24 1050   propofol  (DIPRIVAN ) 1000 MG/100ML infusion  0-50 mcg/kg/min Intravenous Titrated Nelson, Dana G, NP   Stopped at 06/20/24 1106   rosuvastatin  (CRESTOR ) tablet 10 mg  10 mg Per NG tube Daily Tukov-Yual, Magdalene S, NP   10 mg at 06/20/24 1050   senna (SENOKOT) tablet 8.6 mg  1 tablet Per Tube BID PRN Tukov-Yual, Magdalene S, NP       senna (SENOKOT) tablet 8.6 mg  1 tablet Per Tube BID Kathrene Almarie Bake, NP   8.6 mg at 06/19/24 1053   thiamine  (VITAMIN B1) tablet 100 mg  100 mg Per Tube Daily Dgayli, Hose, MD   100 mg at 06/20/24 1050   vancomycin  (VANCOREADY) IVPB 750 mg/150 mL  750 mg Intravenous Q24H Isadora Hose, MD   Paused at 06/20/24 0123     Abtx:  Anti-infectives (From admission, onward)    Start     Dose/Rate Route Frequency Ordered Stop   06/19/24 1000  cefTRIAXone  (ROCEPHIN ) 2 g in sodium chloride  0.9 % 100 mL IVPB        2 g 200 mL/hr over 30 Minutes Intravenous Every 12 hours 06/19/24 0008     06/19/24 0130  acyclovir  (ZOVIRAX )  550 mg in dextrose  5 % 100 mL IVPB  Status:  Discontinued        10 mg/kg  55.2 kg 111 mL/hr over 60 Minutes Intravenous Every 12 hours 06/19/24 0039 06/20/24 0949   06/19/24 0130  vancomycin  (VANCOREADY) IVPB 750 mg/150 mL        750 mg 150 mL/hr over 60 Minutes Intravenous Every 24 hours 06/19/24 0042     06/19/24 0115  cefTRIAXone  (ROCEPHIN ) 1 g in sodium chloride  0.9 % 100 mL IVPB        1 g 200 mL/hr over 30 Minutes Intravenous  Once 06/19/24 0018 06/19/24 0127   06/17/24 1700  cefTRIAXone  (ROCEPHIN ) 1 g in sodium chloride  0.9 % 100 mL IVPB  Status:  Discontinued        1 g 200 mL/hr over 30 Minutes Intravenous Every 24 hours 06/17/24 1558 06/19/24 0008   06/17/24 1600  azithromycin  (ZITHROMAX ) tablet 500 mg        500 mg Per Tube Daily 06/17/24 1558     06/17/24 1600  vancomycin  (VANCOCIN ) IVPB 1000 mg/200 mL premix  Status:  Discontinued        1,000 mg 200 mL/hr over 60 Minutes Intravenous  Once 06/17/24 1558 06/17/24 1952       REVIEW OF SYSTEMS:  NA Objective:  VITALS:  BP 114/62 (BP Location: Left Arm)   Pulse (!) 102   Temp 99 F (37.2 C) (Bladder)   Resp 16   Ht 5' 0.98 (1.549 m)   Wt 63.3 kg   SpO2 100%   BMI 26.38 kg/m   PHYSICAL EXAM:  General: intubated /sedated, pale Head: Normocephalic, without obvious abnormality, atraumatic. Eyes: Conjunctivae clear, anicteric sclerae. Pupils are equal ENT cannot examine Neck:, symmetrical, no adenopathy, thyroid : non tender no carotid bruit and no JVD. Lungs: Clear to auscultation bilaterally. No Wheezing or Rhonchi. No rales. Heart: tachycardia- systolic murmur Abdomen: Soft, non-tender,not distended. Bowel sounds normal. No masses Extremities: atraumatic, no cyanosis. No edema. No clubbing Skin: No rashes or lesions. Or bruising Lymph: Cervical, supraclavicular normal. Neurologic: cannot assess  Lines 06/17/24 CVC A line Foley ETT  Pertinent Labs Lab Results CBC    Component Value Date/Time   WBC  9.9 06/20/2024 0315   RBC 2.66 (L) 06/20/2024 0315   HGB 8.0 (L) 06/20/2024 0315   HGB 10.0 (L) 12/21/2023 1438   HCT 23.5 (L) 06/20/2024 0315   HCT 31.6 (L) 12/21/2023 1438   PLT 117 (L) 06/20/2024 0315   PLT 140 (L) 12/21/2023 1438   MCV 88.3 06/20/2024 0315   MCV 96 12/21/2023 1438   MCV 84 03/01/2013 1844   MCH 30.1 06/20/2024 0315   MCHC 34.0 06/20/2024 0315   RDW 13.5 06/20/2024 0315   RDW 16.5 (H) 12/21/2023 1438   RDW 13.0 03/01/2013 1844   LYMPHSABS 0.7 06/17/2024 1600   LYMPHSABS 1.8 12/21/2023 1438   MONOABS 0.3 06/17/2024 1600   EOSABS 0.0 06/17/2024 1600   EOSABS 0.2 12/21/2023 1438   BASOSABS 0.0 06/17/2024 1600   BASOSABS 0.0 12/21/2023 1438       Latest Ref Rng & Units 06/20/2024    3:15 AM 06/19/2024    3:50 AM 06/18/2024    7:35 PM  CMP  Glucose 70 - 99 mg/dL 734  770  794   BUN 6 - 20 mg/dL 33  37  36   Creatinine 0.44 - 1.00 mg/dL 8.11  8.04  8.01   Sodium 135 - 145 mmol/L 141  138  136   Potassium 3.5 - 5.1 mmol/L 3.5  4.4  5.3   Chloride 98 - 111 mmol/L 110  106  107   CO2 22 - 32 mmol/L 21  20  19    Calcium  8.9 - 10.3 mg/dL 7.1  7.6  7.7       Microbiology: Recent Results (from the past 240 hours)  MRSA Next Gen by PCR, Nasal     Status: None   Collection Time: 06/17/24  3:33 PM   Specimen: Nasal Mucosa; Nasal Swab  Result Value Ref Range Status   MRSA by PCR Next Gen NOT DETECTED NOT DETECTED Final    Comment: (NOTE) The GeneXpert MRSA Assay (FDA approved for NASAL specimens only), is one  component of a comprehensive MRSA colonization surveillance program. It is not intended to diagnose MRSA infection nor to guide or monitor treatment for MRSA infections. Test performance is not FDA approved in patients less than 20 years old. Performed at [facility], [address], [place], [place] [address]   Urine Culture     Status: Abnormal   Collection Time: 06/17/24  3:38 PM   Specimen: Urine, Random  Result Value Ref Range Status    Specimen Description   Final    URINE, RANDOM Performed at [facility], [address] [place], [place] [address]    Special Requests   Final    NONE Reflexed from [id number] Performed at [facility], [address] [place], [place] [address]    Culture >=100,000 COLONIES/mL KLEBSIELLA PNEUMONIAE (A)  Final   Report Status 06/19/2024 FINAL  Final   Organism ID, Bacteria KLEBSIELLA PNEUMONIAE (A)  Final      Susceptibility   Klebsiella pneumoniae - MIC*    AMPICILLIN  >=32 RESISTANT Resistant     CEFAZOLIN (URINE) Value in next row Sensitive      2 SENSITIVEThis is a modified FDA-approved test that has been validated and its performance characteristics determined by the reporting laboratory.  This laboratory is certified under the Clinical Laboratory Improvement Amendments CLIA as qualified to perform high complexity clinical laboratory testing.    CEFEPIME  Value in next row Sensitive      2 SENSITIVEThis is a modified FDA-approved test that has been validated and its performance characteristics determined by the reporting laboratory.  This laboratory is certified under the Clinical Laboratory Improvement Amendments CLIA as qualified to perform high complexity clinical laboratory testing.    ERTAPENEM Value in next row Sensitive      2 SENSITIVEThis is a modified FDA-approved test that has been validated and its performance characteristics determined by the reporting laboratory.  This laboratory is certified under the Clinical Laboratory Improvement Amendments CLIA as qualified to perform high complexity clinical laboratory testing.    CEFTRIAXONE  Value in next row Sensitive      2 SENSITIVEThis is a modified FDA-approved test that has been validated and its performance characteristics determined by the reporting laboratory.  This laboratory is certified under the Clinical Laboratory Improvement Amendments CLIA as qualified to perform high complexity clinical laboratory  testing.    CIPROFLOXACIN Value in next row Sensitive      2 SENSITIVEThis is a modified FDA-approved test that has been validated and its performance characteristics determined by the reporting laboratory.  This laboratory is certified under the Clinical Laboratory Improvement Amendments CLIA as qualified to perform high complexity clinical laboratory testing.    GENTAMICIN Value in next row Sensitive      2 SENSITIVEThis is a modified FDA-approved test that has been validated and its performance characteristics determined by the reporting laboratory.  This laboratory is certified under the Clinical Laboratory Improvement Amendments CLIA as qualified to perform high complexity clinical laboratory testing.    NITROFURANTOIN Value in next row Resistant      2 SENSITIVEThis is a modified FDA-approved test that has been validated and its performance characteristics determined by the reporting laboratory.  This laboratory is certified under the Clinical Laboratory Improvement Amendments CLIA as qualified to perform high complexity clinical laboratory testing.    TRIMETH /SULFA  Value in next row Sensitive      2 SENSITIVEThis is a modified FDA-approved test that has been validated and its performance characteristics determined by the reporting  laboratory.  This laboratory is certified under the Clinical Laboratory Improvement Amendments CLIA as qualified to perform high complexity clinical laboratory testing.    AMPICILLIN /SULBACTAM Value in next row Sensitive      2 SENSITIVEThis is a modified FDA-approved test that has been validated and its performance characteristics determined by the reporting laboratory.  This laboratory is certified under the Clinical Laboratory Improvement Amendments CLIA as qualified to perform high complexity clinical laboratory testing.    PIP/TAZO Value in next row Sensitive      <=4 SENSITIVEThis is a modified FDA-approved test that has been validated and its performance  characteristics determined by the reporting laboratory.  This laboratory is certified under the Clinical Laboratory Improvement Amendments CLIA as qualified to perform high complexity clinical laboratory testing.    MEROPENEM Value in next row Sensitive      <=4 SENSITIVEThis is a modified FDA-approved test that has been validated and its performance characteristics determined by the reporting laboratory.  This laboratory is certified under the Clinical Laboratory Improvement Amendments CLIA as qualified to perform high complexity clinical laboratory testing.    * >=100,000 COLONIES/mL KLEBSIELLA PNEUMONIAE  Blood culture (routine x 2)     Status: None (Preliminary result)   Collection Time: 06/17/24  4:00 PM   Specimen: BLOOD  Result Value Ref Range Status   Specimen Description BLOOD RIGHT ANTECUBITAL  Final   Special Requests   Final    BOTTLES DRAWN AEROBIC AND ANAEROBIC Blood Culture results may not be optimal due to an inadequate volume of blood received in culture bottles   Culture   Final    NO GROWTH 2 DAYS Performed at [facility], [address] [place], [place] [address]    Report Status PENDING  Incomplete  Respiratory (~20 pathogens) panel by PCR     Status: None   Collection Time: 06/17/24  6:20 PM   Specimen: Nasopharyngeal Swab; Respiratory  Result Value Ref Range Status   Adenovirus NOT DETECTED NOT DETECTED Final   Coronavirus 229E NOT DETECTED NOT DETECTED Final    Comment: (NOTE) The Coronavirus on the Respiratory Panel, DOES NOT test for the novel  Coronavirus (2019 nCoV)    Coronavirus HKU1 NOT DETECTED NOT DETECTED Final   Coronavirus NL63 NOT DETECTED NOT DETECTED Final   Coronavirus OC43 NOT DETECTED NOT DETECTED Final   Metapneumovirus NOT DETECTED NOT DETECTED Final   Rhinovirus / Enterovirus NOT DETECTED NOT DETECTED Final   Influenza A NOT DETECTED NOT DETECTED Final   Influenza B NOT DETECTED NOT DETECTED Final   Parainfluenza Virus 1 NOT  DETECTED NOT DETECTED Final   Parainfluenza Virus 2 NOT DETECTED NOT DETECTED Final   Parainfluenza Virus 3 NOT DETECTED NOT DETECTED Final   Parainfluenza Virus 4 NOT DETECTED NOT DETECTED Final   Respiratory Syncytial Virus NOT DETECTED NOT DETECTED Final   Bordetella pertussis NOT DETECTED NOT DETECTED Final   Bordetella Parapertussis NOT DETECTED NOT DETECTED Final   Chlamydophila pneumoniae NOT DETECTED NOT DETECTED Final   Mycoplasma pneumoniae NOT DETECTED NOT DETECTED Final    Comment: Performed at [facility], [address], [place], [place] [address]  Blood culture (routine x 2)     Status: None (Preliminary result)   Collection Time: 06/17/24  7:29 PM   Specimen: BLOOD  Result Value Ref Range Status   Specimen Description BLOOD Pic Line  Final   Special Requests   Final    BOTTLES DRAWN AEROBIC ONLY Blood Culture adequate  volume   Culture   Final    NO GROWTH 2 DAYS Performed at [facility], [address] [place], [place] [address]    Report Status PENDING  Incomplete  SARS Coronavirus 2 by RT PCR (hospital order, performed in [facility] hospital lab) *cepheid single result test* Anterior Nasal Swab     Status: None   Collection Time: 06/18/24  8:15 PM   Specimen: Anterior Nasal Swab  Result Value Ref Range Status   SARS Coronavirus 2 by RT PCR NEGATIVE NEGATIVE Final    Comment: (NOTE) SARS-CoV-2 target nucleic acids are NOT DETECTED.  The SARS-CoV-2 RNA is generally detectable in upper and lower respiratory specimens during the acute phase of infection. The lowest concentration of SARS-CoV-2 viral copies this assay can detect is 250 copies / mL. A negative result does not preclude SARS-CoV-2 infection and should not be used as the sole basis for treatment or other patient management decisions.  A negative result may occur with improper specimen collection / handling, submission of specimen other than nasopharyngeal swab, presence of viral  mutation(s) within the areas targeted by this assay, and inadequate number of viral copies (<250 copies / mL). A negative result must be combined with clinical observations, patient history, and epidemiological information.  Fact Sheet for Patients:   roadlaptop.co.za  Fact Sheet for Healthcare Providers: http://kim-miller.com/  This test is not yet approved or  cleared by the [place]  FDA and has been authorized for detection and/or diagnosis of SARS-CoV-2 by FDA under an Emergency Use Authorization (EUA).  This EUA will remain in effect (meaning this test can be used) for the duration of the COVID-19 declaration under Section 564(b)(1) of the Act, 21 U.S.C. section [id number](b)(1), unless the authorization is terminated or revoked sooner.  Performed at [facility], [address] [place], [place] [address]   CSF culture w Gram Stain     Status: None (Preliminary result)   Collection Time: 06/19/24  2:33 PM   Specimen: PATH Cytology CSF; Cerebrospinal Fluid  Result Value Ref Range Status   Specimen Description   Final    CSF Performed at [facility], [address], [place], [place] [address]    Special Requests   Final    NONE Performed at [facility], [address] [place], [place] [address]    Gram Stain   Final    NO ORGANISMS SEEN WBC SEEN RED BLOOD CELLS PRESENT Performed at [facility], [address] [place], [place] [address]    Culture   Final    NO GROWTH < 24 HOURS Performed at [facility], [address], [place], [place] [address]    Report Status PENDING  Incomplete  Culture, Fungus without Smear     Status: None (Preliminary result)   Collection Time: 06/19/24  2:33 PM   Specimen: PATH Cytology CSF; Cerebrospinal Fluid  Result Value Ref Range Status   Specimen Description   Final    CSF Performed at [facility], [address]  [place], [place] [address]    Special Requests   Final    NONE Performed at [facility], [address] [place], [place] [address]    Culture   Final    NO FUNGUS ISOLATED AFTER 1 DAY Performed at [facility], [address] [place], [place] [address]    Report Status PENDING  Incomplete   Patient has: []  acute illness w/systemic sxs  [  mod] [x]  illness posing risk to life or function  [high]  I reviewed:  (3+) [x]  primary team note [x]  consultant note(s) []  procedure/op note(s) []  micro result(s)   [x]  CBC results [x]  chemistry results [x]  radiology report(s) []  nursing note(s)  I independently visualized:  (any)   []  cxs/plates in lab [x]  plain film images [x]  CT images []  PET images   []  path slide(s) []  ECG tracing []  MRI images []  nuclear scan  I discussed: (any) []  micro and/or path w/lab personnel [x]  drug options and/or interactions w/ID pharmD   []  procedure/OR findings w/other MD(s) []  echo and/or imaging w/other MD(s)   [x]  mgm't w/attending(s) involved in case []  setting up home abx w/OPAT team  Mgm't requires: []  prescription drug(s)  [mod] [x]  intensive toxicity monitoring  [high]   IMAGING RESULTS:  I have personally reviewed the films ?No infiltrate   Patient has: []  acute illness w/systemic sxs  [mod] [x]  illness posing risk to life or function  [high]  I reviewed:  (3+) [x]  primary team note [x]  consultant note(s) []  procedure/op note(s) []  micro result(s)   [x]  CBC results [x]  chemistry results [x]  radiology report(s) []  nursing note(s)  I independently visualized:  (any)   []  cxs/plates in lab [x]  plain film images [x]  CT images []  PET images   []  path slide(s) []  ECG tracing []  MRI images []  nuclear scan  I discussed: (any) []  micro and/or path w/lab personnel [x]  drug options and/or interactions w/ID pharmD   []  procedure/OR findings w/other MD(s) []  echo and/or imaging w/other MD(s)   [x]  mgm't w/attending(s) involved in case []  setting up  home abx w/OPAT team  Mgm't requires: []  prescription drug(s)  [mod] [x]  intensive toxicity monitoring  [high]   Impression/Recommendation ? 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 This consult involved complex antimicrobial management   ________________________________________________ Discussed with  requesting provider and her mother      [1] No Known Allergies

## 2024-06-20 NOTE — IPAL (Signed)
" °  Interdisciplinary Goals of Care Family Meeting   Date carried out: 06/20/2024  Location of the meeting: Bedside  Member's involved: Physician and Family Member or next of kin  Durable Power of Attorney or environmental health practitioner: Mother, Ashley Camacho.   Discussion: We discussed goals of care for Ashley Camacho. Discussed overall medical condition with critical illness secondary to severe hypoglycemia resulting in encephalopathy and respiratory failure. Discussed that she likely had hypoglycemia severe enough to cause brain damage. Explained that we will continue with medical management, including antibiotics and respiratory failure, and allow more time for clearance of metabolic encephalopathy. Should her condition not improve with these interventions then the likelihood of persistent damage will be quite high. Family in agreement with the plan and would like to continue with medical management and give her time to assess for any recovery.  Code status:   Code Status: Full Code   Disposition: Continue current acute care  Time spent for the meeting: 15 minutes    [name], MD  06/20/2024, 1:46 PM   "

## 2024-06-20 NOTE — Progress Notes (Signed)
 "  NAME:  Ashley Camacho, MRN:  [id number], DOB:  [birth year], LOS: 3 ADMISSION DATE:  06/17/2024, CONSULTATION DATE: 06/17/2024 REFERRING MD: Dr. Floy, CHIEF COMPLAINT: Unresponsiveness    Brief Pt Description / Synopsis:  34 y.o. female with PMHx most significant for Type I DM admitted with Acute Metabolic Encephalopathy due to severe hypoglycemia and presumed Neuroglycopenia, Acute Hypoxic Respiratory Failure and suspected pneumonia, requiring intubation for airway protection.  Ruling out meningitis.   History of Present Illness:  34 y/o female with a medical history as indicated below whop presented to the ED via EMS after being found unresponsive. History is obtained from ED records, EMS records and from speaking with patient's mother. Per EMS records, patient was found unresponsive by her friend. Total downtime is uncertain. Blood glucose with EMS was 27 mg/dl. She was given an amp of glucagon  and blood glucose improved to 44 mg/dl.   Upon arrival in the ED, patient was still unresponsive, hypothermic, hypoxic on 3L [place] and pupils were dilated. An I/O was placed in her right leg. She given an amp of D50. Glucose improved to 252 mg/dl. Despite increase in blood glucose, she remained unresponsive and hypoxic hence she was emergently intubated. PCCM consulted for further management.  ED work-up: CT head negative. Chest x-ray shows left lower lobe atelectasis. Labs pending   Of note, patient was diagnosed with influenza A on 05/27/24 and was hospitalized from 1/4-05/30/2024. Per patient's mother, she is a very fragile diabetic. She states that she has been buying groceries for patient because she is not able to afford food. Patient lives with friends. Unfortunately she does not have the contact information for patient's friend who called EMS or the friends with whom she live  Pertinent  Medical History   Past Medical History:  Diagnosis Date   Acute metabolic encephalopathy 06/10/2022   Adrenal  insufficiency    Anemia    Anxiety    Blood transfusion without reported diagnosis    CHF (congestive heart failure) (HCC)    Chronic kidney disease    Depression    Gastroparesis    Hypertension    Hypotension    Intractable nausea and vomiting 11/13/2022   Migraine    Thyroid  disease    Type 1 diabetes (HCC)    Micro Data:  01/25: MRSA PCR>>negative 01/25: Urine>>klebsiella pneumoniae  01/25: Blood x2>>NGTD 01/25: RVP>>negative  01/25: Tracheal aspirate>> 01/26: COVID>>negative  1/27: Encephalitis panel>>negative  1/27: CSF>>  Anti-infectives (From admission, onward)    Start     Dose/Rate Route Frequency Ordered Stop   06/19/24 1000  cefTRIAXone  (ROCEPHIN ) 2 g in sodium chloride  0.9 % 100 mL IVPB        2 g 200 mL/hr over 30 Minutes Intravenous Every 12 hours 06/19/24 0008     06/19/24 0130  acyclovir  (ZOVIRAX ) 550 mg in dextrose  5 % 100 mL IVPB        10 mg/kg  55.2 kg 111 mL/hr over 60 Minutes Intravenous Every 12 hours 06/19/24 0039     06/19/24 0130  vancomycin  (VANCOREADY) IVPB 750 mg/150 mL        750 mg 150 mL/hr over 60 Minutes Intravenous Every 24 hours 06/19/24 0042     06/19/24 0115  cefTRIAXone  (ROCEPHIN ) 1 g in sodium chloride  0.9 % 100 mL IVPB        1 g 200 mL/hr over 30 Minutes Intravenous  Once 06/19/24 0018 06/19/24 0127   06/17/24 1700  cefTRIAXone  (ROCEPHIN ) 1 g in  sodium chloride  0.9 % 100 mL IVPB  Status:  Discontinued        1 g 200 mL/hr over 30 Minutes Intravenous Every 24 hours 06/17/24 1558 06/19/24 0008   06/17/24 1600  azithromycin  (ZITHROMAX ) tablet 500 mg        500 mg Per Tube Daily 06/17/24 1558     06/17/24 1600  vancomycin  (VANCOCIN ) IVPB 1000 mg/200 mL premix  Status:  Discontinued        1,000 mg 200 mL/hr over 60 Minutes Intravenous  Once 06/17/24 1558 06/17/24 1952      Significant Hospital Events: Including procedures, antibiotic start and stop dates in addition to other pertinent events   01/25: Admitted with severe  hypoglycemia and acute hypoxic respiratory failure  01/26: Pt now with DKA requiring insulin  gtt remains mechanically intubated on minimal vent settings.  Mentation precluding extubation at this time  01/27: Pt transitioned off insulin  gtt 01/26.  MRI Brain negative due to continued acute encephalopathy due to concerns of possible meningitis continuing cetriaxone and added vancomycin /acyclovir  and pt pending LP today.  Pts hgb 6.3 with no signs of active bleeding receiving 1 unit pRBC's 1/28: No acute events overnight, remains encephalopathic which is precluding extubation.  Hemodynamically stable, no vasopressors. LP workup inconclusive, consult ID.  Decreased Solu-cortef  given hypertension.  Interim History / Subjective:  As outlined above under significant events   Objective    Blood pressure 136/67, pulse 66, temperature 98.8 F (37.1 C), resp. rate 18, height 5' 0.98 (1.549 m), weight 63.3 kg, SpO2 100%.    Vent Mode: PRVC FiO2 (%):  [30 %] 30 % Set Rate:  [18 bmp] 18 bmp Vt Set:  [400 mL] 400 mL PEEP:  [5 cmH20] 5 cmH20 Plateau Pressure:  [14 cmH20-16 cmH20] 16 cmH20   Intake/Output Summary (Last 24 hours) at 06/20/2024 0753 Last data filed at 06/20/2024 9355 Gross per 24 hour  Intake 5416.6 ml  Output 1500 ml  Net 3916.6 ml   Filed Weights   06/18/24 0500 06/19/24 0500 06/20/24 0500  Weight: 55.2 kg 58.5 kg 63.3 kg    Examination: General: Acutely-ill appearing female sedated not following commands, NAD mechanically intubated  HENT: Supple, no JVD  Lungs: Clear throughout, even, non labored  Cardiovascular: Sinus rhythm, s1s2, no m/r/g, 1+ radial/1+ distal pulses, no edema  Abdomen: +BS x4, soft, non distended  Extremities: Normal bulk and tone  Neuro: Sedated, moving all extremities spontaneously but unable to follow commands, bilateral pupils 2 mm sluggish  GU: Indwelling foley catheter in place draining yellow urine   Resolved problem list   Assessment and Plan    #Acute toxic metabolic encephalopathy  #Sedation needs due to mechanical intubation pain/discomfort  #Suspect neuroglycopenia  #Possible meningitis  Hx: Anxiety and depression  Urine drug screen: negative  CT Head: No acute intracranial abnormality. Left maxillary and bilateral ethmoid sinus mucosal thickening with layering secretions, which can be seen with acute sinusitis. EEG 01/26: negative for seizures or definite epileptiform discharges  - Correct metabolic derangements  - MRI Brain negative for acute abnormality;  - LP 1/27 is indeterminate, low suspicion for bacterial meningitis.  - Maintain normoglycemia  - Avoid sedating medications as able  - Maintain RASS goal of 0 to -1 - PAD protocol to maintain RASS goal: propofol  gtt prn  - WUA daily   #Hypotension ~ RESOLVED #Hypertension  #Mildly elevated troponin likely secondary to demand ischemia  Hx: Chronic diastolic CHF and hypotension  - Continuous telemetry monitoring  -  Aggressive iv fluid resuscitation and prn levophed  gtt to maintain map >65; currently not requiring vasopressors - Lactic acid normal  - Continue outpatient rosuvastatin   - Hold outpatient antihypertensives and diuretics for now  - Prn Hydralazine  and Labetalol  for HTN  #Mechanical intubation for airway protection  - Full vent support for now: vent settings reviewed and established - Continue lung protective strategies  - Maintain plateau pressures less than 30 cm H2O - VAP bundle implemented  - SBT once mentation and respiratory status permits  - Intermittent CXR and ABG's  - Prn bronchodilator therapy   #Acute kidney injury secondary to ATN  #Hyperkalemia~resolved   #Anion gap metabolic acidosis  - Trend BMP and vbg's  - Replace electrolytes as indicated  - Strict I&O's - Avoid nephrotoxic agents as able - Continue LR @125  ml/hr for 1 day  #Klebsiella pneumoniae UTI  #Possible meningitis  - Trend WBC and monitor fever curve  - LP 1/27  is indeterminate, low suspicion for bacterial meningitis. Given critical illness, will continue abx until cultures and sensitivities resulted. Consult ID.  #Anemia with no signs of bleeding~worsening   - Trend CBC  - Monitor for s/sx of bleeding  - Hgb 6.3 receiving 1 unit of pRBC's 01/27 - Transfuse for hgb <7 - Subcutaneous heparin  for VTE px will discontinue if he shows signs of bleeding   #Adrenal insufficiency  - Decrease Solucortef to 50 mg q8hrs due to HTN  #Hypothyroidism  - Continue synthroid    #Gastroparesis  - PPI   #Hypoglycemia~resolved #DKA~resolved   - Transitioned off insulin  gtt 01/27 - SSI and scheduled lantus   - Lantus  increased to 10 units 1/28 - Target CBG readings 140 to 180 - Follow hyper/hypoglycemic protocol   Labs   CBC: Recent Labs  Lab 06/13/24 1606 06/17/24 1600 06/18/24 0440 06/19/24 0350 06/19/24 1152 06/19/24 1830 06/20/24 0315  WBC 8.0 5.8 28.1* 16.0* 14.0*  --  9.9  NEUTROABS 7.0 4.8  --   --   --   --   --   HGB 8.1* 8.4* 8.1* 6.3* 8.4* 8.2* 8.0*  HCT 25.0* 25.4* 25.4* 19.7* 24.3* 24.3* 23.5*  MCV 94.3 91.4 94.8 92.9 88.0  --  88.3  PLT 142* 84* 222 129* 118*  --  117*    Basic Metabolic Panel: Recent Labs  Lab 06/18/24 0440 06/18/24 0602 06/18/24 1100 06/18/24 1448 06/18/24 1935 06/19/24 0350 06/19/24 1152 06/20/24 0315  NA 136   < > 139 138 136 138  --  141  K 7.1*   < > 4.9 5.1 5.3* 4.4  --  3.5  CL 103   < > 109 109 107 106  --  110  CO2 12*   < > 18* 20* 19* 20*  --  21*  GLUCOSE 443*   < > 154* 130* 205* 229*  --  265*  BUN 38*   < > 37* 36* 36* 37*  --  33*  CREATININE 1.93*   < > 2.00* 2.02* 1.98* 1.95*  --  1.88*  CALCIUM  8.1*   < > 7.8* 8.2* 7.7* 7.6*  --  7.1*  MG 1.8  --   --   --   --  1.6* 2.5* 2.2  PHOS 3.6  --   --   --   --  2.8  --  2.6   < > = values in this interval not displayed.   GFR: Estimated Creatinine Clearance: 36.3 mL/min (A) (by C-G formula based on SCr  of 1.88 mg/dL (H)). Recent  Labs  Lab 06/17/24 1600 06/17/24 1820 06/18/24 0440 06/19/24 0350 06/19/24 1152 06/20/24 0315  WBC 5.8  --  28.1* 16.0* 14.0* 9.9  LATICACIDVEN 2.1* 1.9 1.4  --   --   --     Liver Function Tests: Recent Labs  Lab 06/17/24 1600  AST 32  ALT 11  ALKPHOS 59  BILITOT 0.3  PROT 5.9*  ALBUMIN  3.5   No results for input(s): LIPASE, AMYLASE in the last 168 hours. No results for input(s): AMMONIA in the last 168 hours.  ABG    Component Value Date/Time   PHART 7.21 (L) 06/18/2024 0409   PCO2ART 27 (L) 06/18/2024 0409   PO2ART 153 (H) 06/18/2024 0409   HCO3 16.9 (L) 06/18/2024 1100   ACIDBASEDEF 8.3 (H) 06/18/2024 1100   O2SAT 91.8 06/18/2024 1100     Coagulation Profile: No results for input(s): INR, PROTIME in the last 168 hours.  Cardiac Enzymes: No results for input(s): CKTOTAL, CKMB, CKMBINDEX, TROPONINI in the last 168 hours.  HbA1C: Hgb A1c MFr Bld  Date/Time Value Ref Range Status  05/27/2024 11:28 PM 7.1 (H) 4.8 - 5.6 % Final    Comment:    (NOTE) Diagnosis of Diabetes The following HbA1c ranges recommended by the American Diabetes Association (ADA) may be used as an aid in the diagnosis of diabetes mellitus.  Hemoglobin             Suggested A1C NGSP%              Diagnosis  <5.7                   Non Diabetic  5.7-6.4                Pre-Diabetic  >6.4                   Diabetic  <7.0                   Glycemic control for                       adults with diabetes.    10/09/2023 08:34 PM 8.0 (H) 4.8 - 5.6 % Final    Comment:    (NOTE) Pre diabetes:          5.7%-6.4%  Diabetes:              >6.4%  Glycemic control for   <7.0% adults with diabetes     CBG: Recent Labs  Lab 06/19/24 1946 06/19/24 2324 06/19/24 2337 06/20/24 0316 06/20/24 0339  GLUCAP 216* 271* 274* 250* 240*    Review of Systems:   Unable to assess pt mechanically intubated   Past Medical History:  She,  has a past medical history of Acute  metabolic encephalopathy (06/10/2022), Adrenal insufficiency, Anemia, Anxiety, Blood transfusion without reported diagnosis, CHF (congestive heart failure) (HCC), Chronic kidney disease, Depression, Gastroparesis, Hypertension, Hypotension, Intractable nausea and vomiting (11/13/2022), Migraine, Thyroid  disease, and Type 1 diabetes (HCC).   Surgical History:   Past Surgical History:  Procedure Laterality Date   BIOPSY  01/14/2021   Procedure: BIOPSY;  Surgeon: Eda Iha, MD;  Location: [facility] ENDOSCOPY;  Service: Gastroenterology;;   CENTRAL LINE INSERTION N/A 10/10/2023   Procedure: CENTRAL LINE INSERTION;  Surgeon: Marea Selinda RAMAN, MD;  Location: [facility] INVASIVE CV LAB;  Service: Cardiovascular;  Laterality: N/A;   CESAREAN SECTION  x2   CESAREAN SECTION WITH BILATERAL TUBAL LIGATION N/A 02/17/2021   Procedure: CESAREAN SECTION WITH BILATERAL TUBAL LIGATION;  Surgeon: Barbra Lang PARAS, DO;  Location: [facility] LD ORS;  Service: Obstetrics;  Laterality: N/A;   ESOPHAGOGASTRODUODENOSCOPY (EGD) WITH PROPOFOL  N/A 01/14/2021   Procedure: ESOPHAGOGASTRODUODENOSCOPY (EGD) WITH PROPOFOL ;  Surgeon: Eda Iha, MD;  Location: [facility] ENDOSCOPY;  Service: Gastroenterology;  Laterality: N/A;   IR BONE MARROW BIOPSY & ASPIRATION  12/16/2022   IR REPLC DUODEN/JEJUNO TUBE PERCUT W/FLUORO  11/22/2022   JEJUNOSTOMY N/A 06/13/2022   Procedure: DEXTER;  Surgeon: Jordis Laneta FALCON, MD;  Location: [facility] ORS;  Service: General;  Laterality: N/A;   MOUTH SURGERY     TEE WITHOUT CARDIOVERSION N/A 01/06/2024   Procedure: ECHOCARDIOGRAM, TRANSESOPHAGEAL;  Surgeon: Rolan Ezra RAMAN, MD;  Location: [facility] ORS;  Service: Cardiovascular;  Laterality: N/A;   TUBAL LIGATION       Social History:   reports that she has never smoked. She has never been exposed to tobacco smoke. She has never used smokeless tobacco. She reports that she does not drink alcohol and does not use drugs.   Family History:  Her family history  includes ADD / ADHD in her daughter; Breast cancer in her mother; CAD in her father, paternal grandfather, and paternal grandmother; Cancer in her mother; Diabetes in her father, maternal uncle, paternal grandfather, and paternal grandmother; Diabetes type I in her father; Early death in her father; Lung cancer in her maternal grandfather; Seizures in her mother. There is no history of Ovarian cancer.   Allergies Allergies[1]   Home Medications  Prior to Admission medications  Medication Sig Start Date End Date Taking? Authorizing Provider  acetaminophen  (TYLENOL ) 500 MG tablet Take 2 tablets (1,000 mg total) by mouth every 6 (six) hours as needed for headache, fever or moderate pain. 08/05/22  Yes Dorinda Drue DASEN, MD  hydrocortisone  (CORTEF ) 10 MG tablet Take 2 tablets (20 mg total) by mouth daily with breakfast AND 1 tablet (10 mg total) daily with supper. 05/27/23  Yes Sreenath, Sudheer B, MD  insulin  aspart (NOVOLOG ) 100 UNIT/ML FlexPen Inject 2 Units into the skin 3 (three) times daily with meals. If eating and Blood Glucose (BG) 80 or higher inject 2 units for meal coverage and add correction dose per scale. If not eating, correction dose only. BG <150= 0 unit; BG 150-200= 1 unit; BG 201-250= 3 unit; BG 251-300= 5 unit; BG 301-350= 7 unit; BG 351-400= 9 unit; BG >400= 11 unit and Call Primary Care. 10/12/23  Yes [name], MD  Accu-Chek Softclix Lancets lancets Use to check blood sugar up to 6 times daily. May substitute to any manufacturer covered by patient's insurance. 01/19/24   Ostwalt, [name], PA-C  BD PEN NEEDLE MINI ULTRAFINE 31G X 5 MM MISC by Other route. 07/18/23 07/17/24  [provider]  Blood Glucose Monitoring Suppl (BLOOD GLUCOSE MONITOR SYSTEM) w/Device KIT Use to check blood sugar up to 6 times daily. May substitute to any manufacturer covered by patient's insurance. 01/19/24   Ostwalt, [name], PA-C  Continuous Glucose Sensor (DEXCOM G7 SENSOR) MISC Use to check blood  sugar. Change every 10 days 01/19/24   Ostwalt, [name], PA-C  furosemide  (LASIX ) 20 MG tablet Take 1 tablet (20 mg total) by mouth daily as needed. 02/17/24 06/13/24  Donette [place] A, FNP  Glucose Blood (BLOOD GLUCOSE TEST STRIPS) STRP Use to check blood sugar up to 6 times daily. May substitute to any manufacturer covered by patient's insurance.  01/19/24   Ostwalt, [name], PA-C  guaiFENesin  (MUCINEX ) 600 MG 12 hr tablet Take 1 tablet (600 mg total) by mouth 2 (two) times daily. Patient not taking: No sig reported 05/30/24   Arlon Carliss ORN, DO  Insulin  Pen Needle (PEN NEEDLES) 31G X 6 MM MISC PEN NEEDLES 31G X 6 MM 05/31/17   [provider]  Lancet Device MISC Use to check blood sugar up to 6 times daily. May substitute to any manufacturer covered by patient's insurance. 01/19/24   Ostwalt, [name], PA-C  levothyroxine  (SYNTHROID ) 75 MCG tablet TAKE 1 TABLET (75 MCG TOTAL) BY MOUTH DAILY AT 6 (SIX) AM. 12/06/23   Ostwalt, [name], PA-C  metoprolol  succinate (TOPROL -XL) 25 MG 24 hr tablet TAKE 1 TABLET (25 MG TOTAL) BY MOUTH DAILY. Patient not taking: Reported on 06/17/2024 03/23/24   Rolan Ezra RAMAN, MD  nortriptyline (PAMELOR) 25 MG capsule Take 25 mg by mouth at bedtime.    [provider]  NOVOLOG  100 UNIT/ML injection Inject 0-50 Units into the skin as needed for high blood sugar. FOR USE IN INSULIN  PUMP. MAX 50 UNITS PER DAY. 04/14/24   [provider]  oseltamivir  (TAMIFLU ) 30 MG capsule Take 1 capsule (30 mg total) by mouth daily. Patient not taking: No sig reported 05/31/24   Arlon Carliss ORN, DO  rosuvastatin  (CRESTOR ) 10 MG tablet Take 1 tablet (10 mg total) by mouth daily. 01/18/24   Donette Ellouise LABOR, FNP  sacubitril -valsartan  (ENTRESTO ) 24-26 MG Take 1 tablet by mouth 2 (two) times daily. 02/28/24   Rolan Ezra RAMAN, MD  sodium zirconium cyclosilicate  (LOKELMA ) 10 g PACK packet Take 10 g by mouth daily. 02/28/24   Rolan Ezra RAMAN, MD     Critical care time: 40 minutes      Sydelle Ni, AGACNP-student [facility]         [1] No Known Allergies  "

## 2024-06-20 NOTE — Inpatient Diabetes Management (Signed)
 Inpatient Diabetes Program Recommendations  AACE/ADA: New Consensus Statement on Inpatient Glycemic Control (2015)  Target Ranges:  Prepandial:   less than 140 mg/dL      Peak postprandial:   less than 180 mg/dL (1-2 hours)      Critically ill patients:  140 - 180 mg/dL    Latest Reference Range & Units 06/18/24 23:23 06/19/24 03:02 06/19/24 07:49 06/19/24 11:48 06/19/24 16:50 06/19/24 19:46  Glucose-Capillary 70 - 99 mg/dL 780 (H)  2 units Novolog   192 (H)  1 units Novolog   204 (H)  2 units Novolog   198 (H)  1 units Novolog   5 units Lantus  @1054   173 (H)  1 units Novolog   216 (H)  2 units Novolog      Latest Reference Range & Units 06/19/24 23:37 06/20/24 03:16  Glucose-Capillary 70 - 99 mg/dL 725 (H)  3 units Novolog   250 (H)  3 units Novolog       History: Type 1 diabetes  Home DM Meds: OmniPod insulin  pump with Novolog , Dexcom G7 Off pump plan: Lantus  11 units daily, Novolog  1 unit per 20 grams of carbs plus correction of 1:70 mg/dl   Current Orders: Lantus  5 units daily     Novolog  Sensitive Correction Scale/ SSI (0-9 units) Q4H     MD- Note pt getting Solucortef 100 mg Q8H + Tube Feeds 50cc/hr  Note pt Very Sensitive to Insulin   May consider the following:  1. Increase the Lantus  to 7 units daily  2. Start low dose Novolog  Tube Feed Coverage: Novolog  2 units Q4H  HOLD if tube feeds HELD for any reason    --Will follow patient during hospitalization--  Adina Rudolpho Arrow RN, MSN, CDCES Diabetes Coordinator Inpatient Glycemic Control Team Team Pager: [phone] (8a-5p)

## 2024-06-20 NOTE — Plan of Care (Signed)
" °  Problem: Clinical Measurements: Goal: Cardiovascular complication will be avoided Outcome: Progressing   Problem: Nutrition: Goal: Adequate nutrition will be maintained Outcome: Progressing   Problem: Elimination: Goal: Will not experience complications related to bowel motility Outcome: Progressing Goal: Will not experience complications related to urinary retention Outcome: Progressing   Problem: Tissue Perfusion: Goal: Adequacy of tissue perfusion will improve Outcome: Progressing   Problem: Education: Goal: Knowledge of General Education information will improve Description: Including pain rating scale, medication(s)/side effects and non-pharmacologic comfort measures Outcome: Not Progressing   Problem: Health Behavior/Discharge Planning: Goal: Ability to manage health-related needs will improve Outcome: Not Progressing   "

## 2024-06-20 NOTE — Plan of Care (Signed)

## 2024-06-20 NOTE — Progress Notes (Signed)
 MD called to bedside to assess patient after sedation turned off for WUA. Pt agitated, HTN, coughing and gagging on the tube, vomiting up tube feeds. PRN hydralazine  administered. Tube feeds held. See new orders for precedex  gtt. Pt unable to follow commands. Pt moves all four extremities to pain. Pt opens eyes but unable to track.

## 2024-06-20 NOTE — Progress Notes (Signed)
 PHARMACY CONSULT NOTE  Pharmacy Consult for Electrolyte Monitoring and Replacement   Recent Labs: Potassium (mmol/L)  Date Value  06/20/2024 3.5  03/01/2013 4.1   Magnesium  (mg/dL)  Date Value  98/71/7973 2.2   Calcium  (mg/dL)  Date Value  98/71/7973 7.1 (L)   Calcium , Total (mg/dL)  Date Value  89/90/7985 9.1   Albumin  (g/dL)  Date Value  98/74/7973 3.5  12/21/2023 3.3 (L)  03/01/2013 3.9   Phosphorus (mg/dL)  Date Value  98/71/7973 2.6   Sodium (mmol/L)  Date Value  06/20/2024 141  12/21/2023 145 (H)  03/01/2013 133 (L)     Assessment: 34 y.o. female with medical history significant for NICM, CHF, DVT, HTN, esophageal ulcer, PEA arrest, C. Diff, CKD III, type 1 diabetes mellitus (brittle), peripheral neuropathy, suspected EPI (not tested), anxiety, depression, hypertension, severe malnutrition, diabetic gastroparesis requiring J-tube (s/p open placement 06/13/22, dislodged 06/2023), GERD, autoimmune thyroid  disease, celiac disease (reported/neg on biopsy), anemia, autoimmune polyendocrine syndrome type 2, adrenal insufficiency, hyperemesis in pregnancy and shingles who is admitted with AMS and hypoglycemia requiring intubation for airway protection. Pharmacy is asked to follow and replace electrolytes while in CCU  Goal of Therapy:  Electrolytes WNL  Plan:  --Kcl 20 mEq per tube x 1 --Phos-Nak 1 packet per tube x 2 doses --Follow-up electrolytes with AM labs tomorrow  Marolyn KATHEE Mare 06/20/2024 8:06 AM

## 2024-06-21 DIAGNOSIS — E1043 Type 1 diabetes mellitus with diabetic autonomic (poly)neuropathy: Secondary | ICD-10-CM | POA: Diagnosis not present

## 2024-06-21 DIAGNOSIS — E310 Autoimmune polyglandular failure: Secondary | ICD-10-CM | POA: Diagnosis not present

## 2024-06-21 DIAGNOSIS — G9340 Encephalopathy, unspecified: Secondary | ICD-10-CM | POA: Diagnosis not present

## 2024-06-21 DIAGNOSIS — E162 Hypoglycemia, unspecified: Secondary | ICD-10-CM | POA: Diagnosis not present

## 2024-06-21 DIAGNOSIS — G928 Other toxic encephalopathy: Secondary | ICD-10-CM | POA: Diagnosis not present

## 2024-06-21 DIAGNOSIS — J9601 Acute respiratory failure with hypoxia: Secondary | ICD-10-CM | POA: Diagnosis not present

## 2024-06-21 DIAGNOSIS — E10649 Type 1 diabetes mellitus with hypoglycemia without coma: Secondary | ICD-10-CM | POA: Diagnosis not present

## 2024-06-21 DIAGNOSIS — E1069 Type 1 diabetes mellitus with other specified complication: Secondary | ICD-10-CM | POA: Diagnosis not present

## 2024-06-21 LAB — CBC
HCT: 26.5 % — ABNORMAL LOW (ref 36.0–46.0)
Hemoglobin: 8.9 g/dL — ABNORMAL LOW (ref 12.0–15.0)
MCH: 30.4 pg (ref 26.0–34.0)
MCHC: 33.6 g/dL (ref 30.0–36.0)
MCV: 90.4 fL (ref 80.0–100.0)
Platelets: 105 10*3/uL — ABNORMAL LOW (ref 150–400)
RBC: 2.93 MIL/uL — ABNORMAL LOW (ref 3.87–5.11)
RDW: 13.6 % (ref 11.5–15.5)
WBC: 6.9 10*3/uL (ref 4.0–10.5)
nRBC: 0 % (ref 0.0–0.2)

## 2024-06-21 LAB — BASIC METABOLIC PANEL WITH GFR
Anion gap: 13 (ref 5–15)
Anion gap: 10 (ref 5–15)
BUN: 33 mg/dL — ABNORMAL HIGH (ref 6–20)
BUN: 34 mg/dL — ABNORMAL HIGH (ref 6–20)
CO2: 20 mmol/L — ABNORMAL LOW (ref 22–32)
CO2: 20 mmol/L — ABNORMAL LOW (ref 22–32)
Calcium: 7.5 mg/dL — ABNORMAL LOW (ref 8.9–10.3)
Calcium: 7.5 mg/dL — ABNORMAL LOW (ref 8.9–10.3)
Chloride: 116 mmol/L — ABNORMAL HIGH (ref 98–111)
Chloride: 125 mmol/L — ABNORMAL HIGH (ref 98–111)
Creatinine, Ser: 1.76 mg/dL — ABNORMAL HIGH (ref 0.44–1.00)
Creatinine, Ser: 1.75 mg/dL — ABNORMAL HIGH (ref 0.44–1.00)
GFR, Estimated: 39 mL/min — ABNORMAL LOW
GFR, Estimated: 39 mL/min — ABNORMAL LOW
Glucose, Bld: 201 mg/dL — ABNORMAL HIGH (ref 70–99)
Glucose, Bld: 122 mg/dL — ABNORMAL HIGH (ref 70–99)
Potassium: 2.5 mmol/L — CL (ref 3.5–5.1)
Potassium: 3.9 mmol/L (ref 3.5–5.1)
Sodium: 149 mmol/L — ABNORMAL HIGH (ref 135–145)
Sodium: 154 mmol/L — ABNORMAL HIGH (ref 135–145)

## 2024-06-21 LAB — MAGNESIUM: Magnesium: 2.2 mg/dL (ref 1.7–2.4)

## 2024-06-21 LAB — PHOSPHORUS: Phosphorus: 3.1 mg/dL (ref 2.5–4.6)

## 2024-06-21 LAB — GLUCOSE, CAPILLARY
Glucose-Capillary: 198 mg/dL — ABNORMAL HIGH (ref 70–99)
Glucose-Capillary: 213 mg/dL — ABNORMAL HIGH (ref 70–99)
Glucose-Capillary: 196 mg/dL — ABNORMAL HIGH (ref 70–99)
Glucose-Capillary: 135 mg/dL — ABNORMAL HIGH (ref 70–99)
Glucose-Capillary: 124 mg/dL — ABNORMAL HIGH (ref 70–99)
Glucose-Capillary: 105 mg/dL — ABNORMAL HIGH (ref 70–99)

## 2024-06-21 MED ORDER — SODIUM CHLORIDE 0.9 % IV SOLN
2.0000 g | INTRAVENOUS | Status: AC
  Administered 2024-06-22: 2 g via INTRAVENOUS
  Filled 2024-06-21: qty 20

## 2024-06-21 MED ORDER — HYDROCORTISONE SOD SUC (PF) 100 MG IJ SOLR
50.0000 mg | Freq: Two times a day (BID) | INTRAMUSCULAR | Status: AC
  Administered 2024-06-22 – 2024-06-29 (×15): 50 mg via INTRAVENOUS
  Filled 2024-06-21 (×2): qty 2
  Filled 2024-06-21 (×3): qty 1
  Filled 2024-06-21: qty 2
  Filled 2024-06-21: qty 1
  Filled 2024-06-21 (×2): qty 2
  Filled 2024-06-21: qty 1
  Filled 2024-06-21 (×2): qty 2
  Filled 2024-06-21: qty 1
  Filled 2024-06-21: qty 2
  Filled 2024-06-21: qty 1

## 2024-06-21 MED ORDER — SODIUM CHLORIDE 0.9 % IV SOLN: 2.0000 g | INTRAVENOUS | Status: DC

## 2024-06-21 MED ORDER — POTASSIUM CHLORIDE 10 MEQ/50ML IV SOLN
10.0000 meq | INTRAVENOUS | Status: AC
  Administered 2024-06-21 (×4): 10 meq via INTRAVENOUS
  Filled 2024-06-21 (×4): qty 50

## 2024-06-21 MED ORDER — POTASSIUM CHLORIDE 20 MEQ PO PACK
40.0000 meq | PACK | Freq: Once | ORAL | Status: AC
  Administered 2024-06-21: 40 meq
  Filled 2024-06-21: qty 2

## 2024-06-21 MED ORDER — FREE WATER
200.0000 mL | Freq: Four times a day (QID) | Status: DC
  Administered 2024-06-21 – 2024-06-22 (×3): 200 mL

## 2024-06-21 MED ORDER — LABETALOL HCL 5 MG/ML IV SOLN
10.0000 mg | Freq: Two times a day (BID) | INTRAVENOUS | Status: DC
  Administered 2024-06-21 – 2024-06-24 (×8): 10 mg via INTRAVENOUS
  Filled 2024-06-21 (×9): qty 4

## 2024-06-21 MED ORDER — INSULIN GLARGINE-YFGN 100 UNIT/ML ~~LOC~~ SOLN
10.0000 [IU] | Freq: Two times a day (BID) | SUBCUTANEOUS | Status: DC
  Administered 2024-06-21: 10 [IU] via SUBCUTANEOUS
  Filled 2024-06-21 (×3): qty 0.1

## 2024-06-21 NOTE — Progress Notes (Signed)
 "  NAME:  Ashley Camacho, MRN:  [id number], DOB:  01/03/1991, LOS: 4 ADMISSION DATE:  06/17/2024, CONSULTATION DATE: 06/17/2024 REFERRING MD: Dr. Floy, CHIEF COMPLAINT: Unresponsiveness    Brief Pt Description / Synopsis:  34 y.o. female with PMHx most significant for Type I DM admitted with Acute Metabolic Encephalopathy due to severe hypoglycemia and presumed Neuroglycopenia, Acute Hypoxic Respiratory Failure and suspected pneumonia, requiring intubation for airway protection.    History of Present Illness:  34 y/o female with a medical history as indicated below whop presented to the ED via EMS after being found unresponsive. History is obtained from ED records, EMS records and from speaking with patient's mother. Per EMS records, patient was found unresponsive by her friend. Total downtime is uncertain. Blood glucose with EMS was 27 mg/dl. She was given an amp of glucagon  and blood glucose improved to 44 mg/dl.   Upon arrival in the ED, patient was still unresponsive, hypothermic, hypoxic on 3L [place] and pupils were dilated. An I/O was placed in her right leg. She given an amp of D50. Glucose improved to 252 mg/dl. Despite increase in blood glucose, she remained unresponsive and hypoxic hence she was emergently intubated. PCCM consulted for further management.  ED work-up: CT head negative. Chest x-ray shows left lower lobe atelectasis. Labs pending   Of note, patient was diagnosed with influenza A on 05/27/24 and was hospitalized from 1/4-05/30/2024. Per patient's mother, she is a very fragile diabetic. She states that she has been buying groceries for patient because she is not able to afford food. Patient lives with friends. Unfortunately she does not have the contact information for patient's friend who called EMS or the friends with whom she live  Pertinent  Medical History   Past Medical History:  Diagnosis Date   Acute metabolic encephalopathy 06/10/2022   Adrenal insufficiency    Anemia     Anxiety    Blood transfusion without reported diagnosis    CHF (congestive heart failure) (HCC)    Chronic kidney disease    Depression    Gastroparesis    Hypertension    Hypotension    Intractable nausea and vomiting 11/13/2022   Migraine    Thyroid  disease    Type 1 diabetes (HCC)    Micro Data:  01/25: MRSA PCR>>negative 01/25: Urine>>klebsiella pneumoniae  01/25: Blood x2>>NGTD 01/25: RVP>>negative  01/25: Tracheal aspirate>> 01/26: COVID>>negative  1/27: Encephalitis panel>>negative  1/27: CSF>> No growth to date  Anti-infectives (From admission, onward)    Start     Dose/Rate Route Frequency Ordered Stop   06/19/24 1000  cefTRIAXone  (ROCEPHIN ) 2 g in sodium chloride  0.9 % 100 mL IVPB        2 g 200 mL/hr over 30 Minutes Intravenous Every 12 hours 06/19/24 0008     06/19/24 0130  acyclovir  (ZOVIRAX ) 550 mg in dextrose  5 % 100 mL IVPB  Status:  Discontinued        10 mg/kg  55.2 kg 111 mL/hr over 60 Minutes Intravenous Every 12 hours 06/19/24 0039 06/20/24 0949   06/19/24 0130  vancomycin  (VANCOREADY) IVPB 750 mg/150 mL        750 mg 150 mL/hr over 60 Minutes Intravenous Every 24 hours 06/19/24 0042     06/19/24 0115  cefTRIAXone  (ROCEPHIN ) 1 g in sodium chloride  0.9 % 100 mL IVPB        1 g 200 mL/hr over 30 Minutes Intravenous  Once 06/19/24 0018 06/19/24 0127   06/17/24 1700  cefTRIAXone  (ROCEPHIN ) 1 g in sodium chloride  0.9 % 100 mL IVPB  Status:  Discontinued        1 g 200 mL/hr over 30 Minutes Intravenous Every 24 hours 06/17/24 1558 06/19/24 0008   06/17/24 1600  azithromycin  (ZITHROMAX ) tablet 500 mg        500 mg Per Tube Daily 06/17/24 1558     06/17/24 1600  vancomycin  (VANCOCIN ) IVPB 1000 mg/200 mL premix  Status:  Discontinued        1,000 mg 200 mL/hr over 60 Minutes Intravenous  Once 06/17/24 1558 06/17/24 1952      Significant Hospital Events: Including procedures, antibiotic start and stop dates in addition to other pertinent events    01/25: Admitted with severe hypoglycemia and acute hypoxic respiratory failure  01/26: Pt now with DKA requiring insulin  gtt remains mechanically intubated on minimal vent settings.  Mentation precluding extubation at this time  01/27: Pt transitioned off insulin  gtt 01/26.  MRI Brain negative due to continued acute encephalopathy due to concerns of possible meningitis continuing cetriaxone and added vancomycin /acyclovir  and pt pending LP today.  Pts hgb 6.3 with no signs of active bleeding receiving 1 unit pRBC's 1/28: No acute events overnight, remains encephalopathic which is precluding extubation.  Hemodynamically stable, no vasopressors. LP workup inconclusive, consult ID.  Decreased Solu-cortef  given hypertension. 1/29: Mentation precluding extubation. Discontinue Vancomycin  per ID recommendation. Deescalated steroids due to HTN.   Interim History / Subjective:  As outlined above under significant events   Objective    Blood pressure (!) 186/84, pulse 80, temperature (!) 94.8 F (34.9 C), resp. rate 18, height 5' 0.98 (1.549 m), weight 62.3 kg, SpO2 100%.    Vent Mode: PRVC FiO2 (%):  [30 %] 30 % Set Rate:  [18 bmp] 18 bmp Vt Set:  [400 mL] 400 mL PEEP:  [5 cmH20] 5 cmH20 Plateau Pressure:  [15 cmH20-17 cmH20] 17 cmH20   Intake/Output Summary (Last 24 hours) at 06/21/2024 0734 Last data filed at 06/21/2024 9371 Gross per 24 hour  Intake 2302.19 ml  Output 1875 ml  Net 427.19 ml   Filed Weights   06/19/24 0500 06/20/24 0500 06/21/24 0440  Weight: 58.5 kg 63.3 kg 62.3 kg    Examination: General: Acutely-ill appearing female sedated not following commands, NAD mechanically intubated  HENT: Supple, no JVD  Lungs: Clear throughout, even, non labored  Cardiovascular: Sinus rhythm, s1s2, no m/r/g, 1+ radial/1+ distal pulses, no edema  Abdomen: +BS x4, soft, non distended  Extremities: Normal bulk and tone  Neuro: Sedated, moving all extremities spontaneously but unable to  follow commands, bilateral pupils 2 mm sluggish  GU: Indwelling foley catheter in place draining yellow urine   Resolved problem list   Assessment and Plan   #Acute toxic metabolic encephalopathy  #Sedation needs due to mechanical intubation pain/discomfort  #Suspect neuroglycopenia  #Possible meningitis  Hx: Anxiety and depression  Urine drug screen: negative  CT Head: No acute intracranial abnormality. Left maxillary and bilateral ethmoid sinus mucosal thickening with layering secretions, which can be seen with acute sinusitis. EEG 01/26: negative for seizures or definite epileptiform discharges  - Correct metabolic derangements  - MRI Brain negative for acute abnormality;  - LP 1/27 is indeterminate, low suspicion for bacterial meningitis.  - Maintain normoglycemia  - Avoid sedating medications as able  - Maintain RASS goal of 0 to -1 - PAD protocol to maintain RASS goal: propofol  gtt prn  - WUA daily   #Hypotension ~ RESOLVED #  Hypertension  #Mildly elevated troponin likely secondary to demand ischemia  Hx: Chronic diastolic CHF and hypotension  - Continuous telemetry monitoring  - Aggressive iv fluid resuscitation and prn levophed  gtt to maintain map >65; currently not requiring vasopressors - Lactic acid normal  - Continue outpatient rosuvastatin   - Hold outpatient antihypertensives and diuretics for now  - Scheduled Labetalol  for HTN  #Mechanical intubation for airway protection  - Full vent support for now: vent settings reviewed and established - Continue lung protective strategies  - Maintain plateau pressures less than 30 cm H2O - VAP bundle implemented  - SBT once mentation and respiratory status permits  - Intermittent CXR and ABG's  - Prn bronchodilator therapy   #Acute kidney injury secondary to ATN  #Hyperkalemia~resolved   #Hypokalemia #Hypernatremia #Anion gap metabolic acidosis  - Trend BMP and vbg's  - Replace electrolytes as indicated  - Free  water  increased due to hypernatremia - Strict I&O's - Avoid nephrotoxic agents as able - Continue LR @125  ml/hr for 1 day  #Klebsiella pneumoniae UTI  #Possible meningitis  - Trend WBC and monitor fever curve  - Evaluated by ID,  Neutrophilic pleocytosis with normal protein- Meningitis encephalitis panel neg- so this is not bacteria meningitis like pneumococcus, listeria or hemophilus  Not HSV - Continue Ceftriaxone  per ID  #Anemia with no signs of bleeding~worsening   - Trend CBC  - Monitor for s/sx of bleeding  - Hgb 6.3 receiving 1 unit of pRBC's 01/27 - Transfuse for hgb <7 - Subcutaneous heparin  for VTE px will discontinue if he shows signs of bleeding   #Adrenal insufficiency  - Decrease Solucortef to 50 mg q12hrs due to HTN  #Hypothyroidism  - Continue synthroid    #Gastroparesis  - PPI   #Hypoglycemia~resolved #DKA~resolved   - Transitioned off insulin  gtt 01/27 - SSI and scheduled lantus   - Lantus  increased to 10 BID - Target CBG readings 140 to 180 - Follow hyper/hypoglycemic protocol   Labs   CBC: Recent Labs  Lab 06/17/24 1600 06/18/24 0440 06/19/24 0350 06/19/24 1152 06/19/24 1830 06/20/24 0315 06/21/24 0432  WBC 5.8 28.1* 16.0* 14.0*  --  9.9 6.9  NEUTROABS 4.8  --   --   --   --   --   --   HGB 8.4* 8.1* 6.3* 8.4* 8.2* 8.0* 8.9*  HCT 25.4* 25.4* 19.7* 24.3* 24.3* 23.5* 26.5*  MCV 91.4 94.8 92.9 88.0  --  88.3 90.4  PLT 84* 222 129* 118*  --  117* 105*    Basic Metabolic Panel: Recent Labs  Lab 06/18/24 0440 06/18/24 0602 06/18/24 1448 06/18/24 1935 06/19/24 0350 06/19/24 1152 06/20/24 0315 06/21/24 0432  NA 136   < > 138 136 138  --  141 149*  K 7.1*   < > 5.1 5.3* 4.4  --  3.5 2.5*  CL 103   < > 109 107 106  --  110 116*  CO2 12*   < > 20* 19* 20*  --  21* 20*  GLUCOSE 443*   < > 130* 205* 229*  --  265* 201*  BUN 38*   < > 36* 36* 37*  --  33* 33*  CREATININE 1.93*   < > 2.02* 1.98* 1.95*  --  1.88* 1.76*  CALCIUM  8.1*   < >  8.2* 7.7* 7.6*  --  7.1* 7.5*  MG 1.8  --   --   --  1.6* 2.5* 2.2 2.2  PHOS 3.6  --   --   --  2.8  --  2.6 3.1   < > = values in this interval not displayed.   GFR: Estimated Creatinine Clearance: 38.5 mL/min (A) (by C-G formula based on SCr of 1.76 mg/dL (H)). Recent Labs  Lab 06/17/24 1600 06/17/24 1820 06/18/24 0440 06/19/24 0350 06/19/24 1152 06/20/24 0315 06/21/24 0432  WBC 5.8  --  28.1* 16.0* 14.0* 9.9 6.9  LATICACIDVEN 2.1* 1.9 1.4  --   --   --   --     Liver Function Tests: Recent Labs  Lab 06/17/24 1600  AST 32  ALT 11  ALKPHOS 59  BILITOT 0.3  PROT 5.9*  ALBUMIN  3.5   No results for input(s): LIPASE, AMYLASE in the last 168 hours. No results for input(s): AMMONIA in the last 168 hours.  ABG    Component Value Date/Time   PHART 7.21 (L) 06/18/2024 0409   PCO2ART 27 (L) 06/18/2024 0409   PO2ART 153 (H) 06/18/2024 0409   HCO3 16.9 (L) 06/18/2024 1100   ACIDBASEDEF 8.3 (H) 06/18/2024 1100   O2SAT 91.8 06/18/2024 1100     Coagulation Profile: No results for input(s): INR, PROTIME in the last 168 hours.  Cardiac Enzymes: No results for input(s): CKTOTAL, CKMB, CKMBINDEX, TROPONINI in the last 168 hours.  HbA1C: Hgb A1c MFr Bld  Date/Time Value Ref Range Status  05/27/2024 11:28 PM 7.1 (H) 4.8 - 5.6 % Final    Comment:    (NOTE) Diagnosis of Diabetes The following HbA1c ranges recommended by the American Diabetes Association (ADA) may be used as an aid in the diagnosis of diabetes mellitus.  Hemoglobin             Suggested A1C NGSP%              Diagnosis  <5.7                   Non Diabetic  5.7-6.4                Pre-Diabetic  >6.4                   Diabetic  <7.0                   Glycemic control for                       adults with diabetes.    10/09/2023 08:34 PM 8.0 (H) 4.8 - 5.6 % Final    Comment:    (NOTE) Pre diabetes:          5.7%-6.4%  Diabetes:              >6.4%  Glycemic control for    <7.0% adults with diabetes     CBG: Recent Labs  Lab 06/20/24 1625 06/20/24 1941 06/20/24 2325 06/21/24 0344 06/21/24 0722  GLUCAP 154* 83 113* 198* 213*    Review of Systems:   Unable to assess pt mechanically intubated   Past Medical History:  She,  has a past medical history of Acute metabolic encephalopathy (06/10/2022), Adrenal insufficiency, Anemia, Anxiety, Blood transfusion without reported diagnosis, CHF (congestive heart failure) (HCC), Chronic kidney disease, Depression, Gastroparesis, Hypertension, Hypotension, Intractable nausea and vomiting (11/13/2022), Migraine, Thyroid  disease, and Type 1 diabetes (HCC).   Surgical History:   Past Surgical History:  Procedure Laterality Date   BIOPSY  01/14/2021   Procedure: BIOPSY;  Surgeon: Eda Iha, MD;  Location: [facility] ENDOSCOPY;  Service: Gastroenterology;;  CENTRAL LINE INSERTION N/A 10/10/2023   Procedure: CENTRAL LINE INSERTION;  Surgeon: Marea Selinda RAMAN, MD;  Location: [facility] INVASIVE CV LAB;  Service: Cardiovascular;  Laterality: N/A;   CESAREAN SECTION     x2   CESAREAN SECTION WITH BILATERAL TUBAL LIGATION N/A 02/17/2021   Procedure: CESAREAN SECTION WITH BILATERAL TUBAL LIGATION;  Surgeon: Barbra Lang PARAS, DO;  Location: [facility] LD ORS;  Service: Obstetrics;  Laterality: N/A;   ESOPHAGOGASTRODUODENOSCOPY (EGD) WITH PROPOFOL  N/A 01/14/2021   Procedure: ESOPHAGOGASTRODUODENOSCOPY (EGD) WITH PROPOFOL ;  Surgeon: Eda Iha, MD;  Location: [facility] ENDOSCOPY;  Service: Gastroenterology;  Laterality: N/A;   IR BONE MARROW BIOPSY & ASPIRATION  12/16/2022   IR REPLC DUODEN/JEJUNO TUBE PERCUT W/FLUORO  11/22/2022   JEJUNOSTOMY N/A 06/13/2022   Procedure: DEXTER;  Surgeon: Jordis Laneta FALCON, MD;  Location: [facility] ORS;  Service: General;  Laterality: N/A;   MOUTH SURGERY     TEE WITHOUT CARDIOVERSION N/A 01/06/2024   Procedure: ECHOCARDIOGRAM, TRANSESOPHAGEAL;  Surgeon: Rolan Ezra RAMAN, MD;  Location: [facility] ORS;  Service:  Cardiovascular;  Laterality: N/A;   TUBAL LIGATION       Social History:   reports that she has never smoked. She has never been exposed to tobacco smoke. She has never used smokeless tobacco. She reports that she does not drink alcohol and does not use drugs.   Family History:  Her family history includes ADD / ADHD in her daughter; Breast cancer in her mother; CAD in her father, paternal grandfather, and paternal grandmother; Cancer in her mother; Diabetes in her father, maternal uncle, paternal grandfather, and paternal grandmother; Diabetes type I in her father; Early death in her father; Lung cancer in her maternal grandfather; Seizures in her mother. There is no history of Ovarian cancer.   Allergies Allergies[1]   Home Medications  Prior to Admission medications  Medication Sig Start Date End Date Taking? Authorizing Provider  acetaminophen  (TYLENOL ) 500 MG tablet Take 2 tablets (1,000 mg total) by mouth every 6 (six) hours as needed for headache, fever or moderate pain. 08/05/22  Yes Dorinda Drue DASEN, MD  hydrocortisone  (CORTEF ) 10 MG tablet Take 2 tablets (20 mg total) by mouth daily with breakfast AND 1 tablet (10 mg total) daily with supper. 05/27/23  Yes Sreenath, Sudheer B, MD  insulin  aspart (NOVOLOG ) 100 UNIT/ML FlexPen Inject 2 Units into the skin 3 (three) times daily with meals. If eating and Blood Glucose (BG) 80 or higher inject 2 units for meal coverage and add correction dose per scale. If not eating, correction dose only. BG <150= 0 unit; BG 150-200= 1 unit; BG 201-250= 3 unit; BG 251-300= 5 unit; BG 301-350= 7 unit; BG 351-400= 9 unit; BG >400= 11 unit and Call Primary Care. 10/12/23  Yes [name], MD  Accu-Chek Softclix Lancets lancets Use to check blood sugar up to 6 times daily. May substitute to any manufacturer covered by patient's insurance. 01/19/24   Ostwalt, [name], PA-C  BD PEN NEEDLE MINI ULTRAFINE 31G X 5 MM MISC by Other route. 07/18/23 07/17/24  [provider]  Blood Glucose Monitoring Suppl (BLOOD GLUCOSE MONITOR SYSTEM) w/Device KIT Use to check blood sugar up to 6 times daily. May substitute to any manufacturer covered by patient's insurance. 01/19/24   Ostwalt, [name], PA-C  Continuous Glucose Sensor (DEXCOM G7 SENSOR) MISC Use to check blood sugar. Change every 10 days 01/19/24   Ostwalt, [name], PA-C  furosemide  (LASIX ) 20 MG tablet Take 1 tablet (20 mg total) by mouth  daily as needed. 02/17/24 06/13/24  [place] A, FNP  Glucose Blood (BLOOD GLUCOSE TEST STRIPS) STRP Use to check blood sugar up to 6 times daily. May substitute to any manufacturer covered by patient's insurance. 01/19/24   Ostwalt, [name], PA-C  guaiFENesin  (MUCINEX ) 600 MG 12 hr tablet Take 1 tablet (600 mg total) by mouth 2 (two) times daily. Patient not taking: No sig reported 05/30/24   Arlon Carliss ORN, DO  Insulin  Pen Needle (PEN NEEDLES) 31G X 6 MM MISC PEN NEEDLES 31G X 6 MM 05/31/17   [provider]  Lancet Device MISC Use to check blood sugar up to 6 times daily. May substitute to any manufacturer covered by patient's insurance. 01/19/24   Ostwalt, [name], PA-C  levothyroxine  (SYNTHROID ) 75 MCG tablet TAKE 1 TABLET (75 MCG TOTAL) BY MOUTH DAILY AT 6 (SIX) AM. 12/06/23   Ostwalt, [name], PA-C  metoprolol  succinate (TOPROL -XL) 25 MG 24 hr tablet TAKE 1 TABLET (25 MG TOTAL) BY MOUTH DAILY. Patient not taking: Reported on 06/17/2024 03/23/24   Rolan Ezra RAMAN, MD  nortriptyline (PAMELOR) 25 MG capsule Take 25 mg by mouth at bedtime.    [provider]  NOVOLOG  100 UNIT/ML injection Inject 0-50 Units into the skin as needed for high blood sugar. FOR USE IN INSULIN  PUMP. MAX 50 UNITS PER DAY. 04/14/24   [provider]  oseltamivir  (TAMIFLU ) 30 MG capsule Take 1 capsule (30 mg total) by mouth daily. Patient not taking: No sig reported 05/31/24   Arlon Carliss ORN, DO  rosuvastatin  (CRESTOR ) 10 MG tablet Take 1 tablet (10 mg total) by mouth daily.  01/18/24   [place] LABOR, FNP  sacubitril -valsartan  (ENTRESTO ) 24-26 MG Take 1 tablet by mouth 2 (two) times daily. 02/28/24   Rolan Ezra RAMAN, MD  sodium zirconium cyclosilicate  (LOKELMA ) 10 g PACK packet Take 10 g by mouth daily. 02/28/24   Rolan Ezra RAMAN, MD     Critical Care Time: 40 minutes    Sydelle Ni, AGACNP-student [facility]          [1] No Known Allergies  "

## 2024-06-21 NOTE — Progress Notes (Signed)
 PHARMACY CONSULT NOTE  Pharmacy Consult for Electrolyte Monitoring and Replacement   Recent Labs: Potassium (mmol/L)  Date Value  06/21/2024 2.5 (LL)  03/01/2013 4.1   Magnesium  (mg/dL)  Date Value  98/70/7973 2.2   Calcium  (mg/dL)  Date Value  98/70/7973 7.5 (L)   Calcium , Total (mg/dL)  Date Value  89/90/7985 9.1   Albumin  (g/dL)  Date Value  98/74/7973 3.5  12/21/2023 3.3 (L)  03/01/2013 3.9   Phosphorus (mg/dL)  Date Value  98/70/7973 3.1   Sodium (mmol/L)  Date Value  06/21/2024 149 (H)  12/21/2023 145 (H)  03/01/2013 133 (L)     Assessment: 34 y.o. female with medical history significant for NICM, CHF, DVT, HTN, esophageal ulcer, PEA arrest, C. Diff, CKD III, type 1 diabetes mellitus (brittle), peripheral neuropathy, suspected EPI (not tested), anxiety, depression, hypertension, severe malnutrition, diabetic gastroparesis requiring J-tube (s/p open placement 06/13/22, dislodged 06/2023), GERD, autoimmune thyroid  disease, celiac disease (reported/neg on biopsy), anemia, autoimmune polyendocrine syndrome type 2, adrenal insufficiency, hyperemesis in pregnancy and shingles who is admitted with AMS and hypoglycemia requiring intubation for airway protection. Pharmacy is asked to follow and replace electrolytes while in CCU  Goal of Therapy:  Electrolytes WNL  Plan:  --Kcl 40 mEq per tube x 1; Kcl 10 mEq IV x4 --Sodium 149 today, continue to monitor --Follow-up electrolytes with AM labs tomorrow  Belvie Macintosh, PharmD Candidate 06/21/2024 7:36 AM

## 2024-06-21 NOTE — Plan of Care (Signed)
" °  Problem: Clinical Measurements: Goal: Ability to maintain clinical measurements within normal limits will improve Outcome: Progressing Goal: Will remain free from infection Outcome: Progressing Goal: Respiratory complications will improve Outcome: Progressing   Problem: Nutrition: Goal: Adequate nutrition will be maintained Outcome: Progressing   Problem: Coping: Goal: Level of anxiety will decrease Outcome: Progressing   Problem: Elimination: Goal: Will not experience complications related to urinary retention Outcome: Progressing   Problem: Pain Managment: Goal: General experience of comfort will improve and/or be controlled Outcome: Progressing   Problem: Safety: Goal: Ability to remain free from injury will improve Outcome: Progressing   Problem: Skin Integrity: Goal: Risk for impaired skin integrity will decrease Outcome: Progressing   Problem: Skin Integrity: Goal: Risk for impaired skin integrity will decrease Outcome: Progressing   Problem: Tissue Perfusion: Goal: Adequacy of tissue perfusion will improve Outcome: Progressing   "

## 2024-06-21 NOTE — Plan of Care (Signed)
 Patient remains intubated while on Precedex  and with tube feeds continuing.   Problem: Clinical Measurements: Goal: Will remain free from infection Outcome: Progressing   Problem: Activity: Goal: Risk for activity intolerance will decrease Outcome: Progressing   Problem: Nutrition: Goal: Adequate nutrition will be maintained Outcome: Progressing   Problem: Coping: Goal: Level of anxiety will decrease Outcome: Progressing   Problem: Elimination: Goal: Will not experience complications related to bowel motility Outcome: Progressing Goal: Will not experience complications related to urinary retention Outcome: Progressing   Problem: Pain Managment: Goal: General experience of comfort will improve and/or be controlled Outcome: Progressing   Problem: Safety: Goal: Ability to remain free from injury will improve Outcome: Progressing   Problem: Skin Integrity: Goal: Risk for impaired skin integrity will decrease Outcome: Progressing   Problem: Education: Goal: Knowledge of General Education information will improve Description: Including pain rating scale, medication(s)/side effects and non-pharmacologic comfort measures Outcome: Not Progressing

## 2024-06-21 NOTE — Progress Notes (Addendum)
 PHARMACY CONSULT NOTE  Pharmacy Consult for Electrolyte Monitoring and Replacement   Recent Labs: Potassium (mmol/L)  Date Value  06/21/2024 3.9  03/01/2013 4.1   Magnesium  (mg/dL)  Date Value  98/70/7973 2.2   Calcium  (mg/dL)  Date Value  98/70/7973 7.5 (L)   Calcium , Total (mg/dL)  Date Value  89/90/7985 9.1   Albumin  (g/dL)  Date Value  98/74/7973 3.5  12/21/2023 3.3 (L)  03/01/2013 3.9   Phosphorus (mg/dL)  Date Value  98/70/7973 3.1   Sodium (mmol/L)  Date Value  06/21/2024 154 (H)  12/21/2023 145 (H)  03/01/2013 133 (L)     Assessment: 34 y.o. female with medical history significant for NICM, CHF, DVT, HTN, esophageal ulcer, PEA arrest, C. Diff, CKD III, type 1 diabetes mellitus (brittle), peripheral neuropathy, suspected EPI (not tested), anxiety, depression, hypertension, severe malnutrition, diabetic gastroparesis requiring J-tube (s/p open placement 06/13/22, dislodged 06/2023), GERD, autoimmune thyroid  disease, celiac disease (reported/neg on biopsy), anemia, autoimmune polyendocrine syndrome type 2, adrenal insufficiency, hyperemesis in pregnancy and shingles who is admitted with AMS and hypoglycemia requiring intubation for airway protection. Pharmacy is asked to follow and replace electrolytes while in CCU  Diet: NPO - NG/OG  Nutrition: Continuous feeds (VITAL 1.5 CAL) 50 ml/hr, free water  200 ml q6h  Goal of Therapy:  Electrolytes WNL  Plan:  --K 3.9 -> no replacement needed at this time --Sodium 154, continue to monitor --Follow-up electrolytes with AM labs tomorrow  Ashley Camacho, PharmD Pharmacy Resident  06/21/2024 7:18 PM

## 2024-06-21 NOTE — Progress Notes (Signed)
 "  Date of Admission:  06/17/2024    ID: Ashley Camacho is a 34 y.o. female  Principal Problem:   Acute hypoxic respiratory failure (HCC) Active Problems:   Toxic metabolic encephalopathy   Neuroglycopenia   Her house mate at bed side She staees she saw the patient the night before and was doing well- the next morning patient was found on the floor unresponsive- could be 12 hrs Subjective: Remains intubated  Medications:   azithromycin   500 mg Per Tube Daily   Chlorhexidine  Gluconate Cloth  6 each Topical Daily   free water   200 mL Per Tube Q6H   heparin   5,000 Units Subcutaneous Q8H   [START ON 06/22/2024] hydrocortisone  sod succinate (SOLU-CORTEF ) inj  50 mg Intravenous Q12H   insulin  aspart  0-15 Units Subcutaneous Q4H   insulin  glargine  10 Units Subcutaneous BID   labetalol   10 mg Intravenous Q12H   levothyroxine   75 mcg Per NG tube Q0600   mouth rinse  15 mL Mouth Rinse Q2H   pantoprazole  (PROTONIX ) IV  40 mg Intravenous Q12H   polyethylene glycol  17 g Per Tube Daily   rosuvastatin   10 mg Per NG tube Daily   senna  1 tablet Per Tube BID   thiamine   100 mg Per Tube Daily    Objective: Vital signs in last 24 hours: Patient Vitals for the past 24 hrs:  BP Temp Temp src Pulse Resp SpO2 Weight  06/21/24 1526 -- -- -- -- -- 100 % --  06/21/24 1500 -- 99.1 F (37.3 C) -- 83 18 100 % --  06/21/24 1445 -- 99 F (37.2 C) -- 83 18 100 % --  06/21/24 1430 -- 99 F (37.2 C) -- 87 18 100 % --  06/21/24 1415 -- 99.1 F (37.3 C) -- 85 18 100 % --  06/21/24 1400 -- 98.8 F (37.1 C) -- 99 19 100 % --  06/21/24 1345 -- 98.8 F (37.1 C) -- 86 18 100 % --  06/21/24 1330 -- 98.8 F (37.1 C) -- 86 18 100 % --  06/21/24 1315 -- 99 F (37.2 C) -- 85 16 100 % --  06/21/24 1300 -- 98.6 F (37 C) -- 82 19 100 % --  06/21/24 1245 -- 98.6 F (37 C) -- 87 17 100 % --  06/21/24 1230 -- 98.6 F (37 C) -- (!) 104 (!) 21 100 % --  06/21/24 1215 -- 98.8 F (37.1 C) -- 89 18 100 % --   06/21/24 1200 (!) 89/50 98.6 F (37 C) -- 90 20 100 % --  06/21/24 1145 -- 98.6 F (37 C) -- 94 20 100 % --  06/21/24 1135 -- -- -- -- -- 100 % --  06/21/24 1130 -- 98.2 F (36.8 C) -- 96 (!) 21 100 % --  06/21/24 1115 -- 97.7 F (36.5 C) -- 93 (!) 24 100 % --  06/21/24 1100 -- (!) 97.5 F (36.4 C) -- 91 18 100 % --  06/21/24 1045 -- (!) 97.2 F (36.2 C) -- 92 19 100 % --  06/21/24 1030 -- (!) 97 F (36.1 C) -- 91 18 100 % --  06/21/24 1015 -- (!) 96.6 F (35.9 C) -- 79 18 100 % --  06/21/24 1000 -- (!) 96.4 F (35.8 C) -- 73 18 100 % --  06/21/24 0945 -- (!) 96.1 F (35.6 C) -- 70 18 100 % --  06/21/24 0930 -- (!) 95.5 F (  35.3 C) -- 73 18 100 % --  06/21/24 0915 -- (!) 95.7 F (35.4 C) -- 73 18 100 % --  06/21/24 0900 -- (!) 95.7 F (35.4 C) -- 70 18 100 % --  06/21/24 0845 -- (!) 95.7 F (35.4 C) -- 68 18 100 % --  06/21/24 0832 (!) 191/84 -- -- -- -- -- --  06/21/24 0830 -- (!) 95.7 F (35.4 C) -- 62 18 100 % --  06/21/24 0815 -- (!) 95.7 F (35.4 C) -- 72 16 100 % --  06/21/24 0800 (!) 151/81 (!) 95.7 F (35.4 C) -- 64 18 100 % --  06/21/24 0615 -- (!) 94.8 F (34.9 C) -- 80 18 100 % --  06/21/24 0600 -- (!) 94.8 F (34.9 C) -- 68 14 100 % --  06/21/24 0545 -- (!) 94.8 F (34.9 C) -- 68 18 100 % --  06/21/24 0530 -- (!) 94.8 F (34.9 C) -- 70 18 100 % --  06/21/24 0515 -- (!) 94.8 F (34.9 C) -- 73 18 100 % --  06/21/24 0500 -- (!) 94.8 F (34.9 C) -- 73 18 100 % --  06/21/24 0445 -- (!) 94.8 F (34.9 C) -- 73 18 100 % --  06/21/24 0440 -- -- -- -- -- -- 62.3 kg  06/21/24 0430 -- (!) 94.8 F (34.9 C) -- 73 14 100 % --  06/21/24 0417 -- (!) 94.8 F (34.9 C) Bladder 73 (!) 21 100 % --  06/21/24 0415 -- (!) 94.8 F (34.9 C) -- 68 14 100 % --  06/21/24 0408 (!) 186/84 -- -- -- -- -- --  06/21/24 0400 105/82 (!) 94.6 F (34.8 C) -- 69 18 100 % --  06/21/24 0345 -- (!) 94.8 F (34.9 C) -- 78 12 100 % --  06/21/24 0330 -- (!) 94.8 F (34.9 C) -- 79 18  100 % --  06/21/24 0315 -- (!) 95 F (35 C) -- 76 18 100 % --  06/21/24 0300 -- (!) 95 F (35 C) -- 79 (!) 8 100 % --  06/21/24 0230 -- (!) 95.4 F (35.2 C) -- 73 18 100 % --  06/21/24 0215 -- (!) 95.7 F (35.4 C) -- 76 (!) 0 100 % --  06/21/24 0200 -- (!) 95.9 F (35.5 C) -- 81 (!) 9 100 % --  06/21/24 0145 -- (!) 95.9 F (35.5 C) -- 81 18 100 % --  06/21/24 0130 -- (!) 95.7 F (35.4 C) -- 80 20 100 % --  06/21/24 0115 -- (!) 96.1 F (35.6 C) -- 76 18 100 % --  06/21/24 0100 -- (!) 96.3 F (35.7 C) -- 73 18 100 % --  06/21/24 0045 -- (!) 96.3 F (35.7 C) -- 78 18 100 % --  06/21/24 0030 -- (!) 96.4 F (35.8 C) -- 78 18 100 % --  06/21/24 0015 -- (!) 96.6 F (35.9 C) -- 80 18 100 % --  06/21/24 0000 (!) 94/49 (!) 96.6 F (35.9 C) -- 80 18 100 % --  06/20/24 2345 -- (!) 96.8 F (36 C) -- 66 18 100 % --  06/20/24 2343 (!) 176/79 -- -- -- -- -- --  06/20/24 2330 -- (!) 96.6 F (35.9 C) Bladder 72 18 100 % --  06/20/24 2315 -- (!) 96.8 F (36 C) -- 75 18 100 % --  06/20/24 2300 -- (!) 96.8 F (36 C) -- 81 18 100 % --  06/20/24 2245 -- (!) 96.8 F (36 C) -- 85 18 100 % --  06/20/24 2230 -- (!) 96.8 F (36 C) -- 87 18 100 % --  06/20/24 2215 -- (!) 96.6 F (35.9 C) -- 89 16 100 % --  06/20/24 2200 -- (!) 96.4 F (35.8 C) -- 84 18 100 % --  06/20/24 2145 -- (!) 96.4 F (35.8 C) -- 82 18 100 % --  06/20/24 2130 -- (!) 96.6 F (35.9 C) -- 80 18 100 % --  06/20/24 2115 -- (!) 96.6 F (35.9 C) -- 81 18 100 % --  06/20/24 2100 -- (!) 96.6 F (35.9 C) -- 80 18 100 % --  06/20/24 2045 -- (!) 96.8 F (36 C) -- 79 18 100 % --  06/20/24 2030 -- (!) 96.8 F (36 C) -- 81 18 100 % --  06/20/24 2015 -- (!) 96.6 F (35.9 C) -- 85 18 100 % --  06/20/24 2000 (!) 103/52 (!) 96.8 F (36 C) -- 86 18 100 % --  06/20/24 1945 -- (!) 97.2 F (36.2 C) -- 84 18 100 % --  06/20/24 1930 -- (!) 97.3 F (36.3 C) -- 87 18 100 % --  06/20/24 1927 -- (!) 97.3 F (36.3 C) Bladder 86 18 100 %  --  06/20/24 1915 -- (!) 97.5 F (36.4 C) -- 87 18 100 % --  06/20/24 1900 -- (!) 97.5 F (36.4 C) -- 88 18 100 % --  06/20/24 1845 -- (!) 97.5 F (36.4 C) -- 88 18 100 % --  06/20/24 1830 -- 97.9 F (36.6 C) -- 89 18 100 % --  06/20/24 1815 -- 98.1 F (36.7 C) -- 79 18 100 % --  06/20/24 1800 -- 98.1 F (36.7 C) -- 84 18 100 % --  06/20/24 1745 -- 98.2 F (36.8 C) -- 70 18 100 % --  06/20/24 1730 -- 98.4 F (36.9 C) -- 75 17 100 % --  06/20/24 1715 -- 98.6 F (37 C) -- 78 16 100 % --     Lines and Device Date on insertion # of days DC  Chiropractor     Foley     ETT       PHYSICAL EXAM:  General: Alert, cooperative, no distress, appears stated age.  Head: Normocephalic, without obvious abnormality, atraumatic. Eyes: Conjunctivae clear, anicteric sclerae. Pupils are equal ENT Nares normal. No drainage or sinus tenderness. Lips, mucosa, and tongue normal. No Thrush Neck: Supple, symmetrical, no adenopathy, thyroid : non tender no carotid bruit and no JVD. Back: No CVA tenderness. Lungs: Clear to auscultation bilaterally. No Wheezing or Rhonchi. No rales. Heart: Regular rate and rhythm, no murmur, rub or gallop. Abdomen: Soft, non-tender,not distended. Bowel sounds normal. No masses Extremities: atraumatic, no cyanosis. No edema. No clubbing Skin: No rashes or lesions. Or bruising Lymph: Cervical, supraclavicular normal. Neurologic: Grossly non-focal  Lab Results    Latest Ref Rng & Units 06/21/2024    4:32 AM 06/20/2024    3:15 AM 06/19/2024    6:30 PM  CBC  WBC 4.0 - 10.5 K/uL 6.9  9.9    Hemoglobin 12.0 - 15.0 g/dL 8.9  8.0  8.2   Hematocrit 36.0 - 46.0 % 26.5  23.5  24.3   Platelets 150 - 400 K/uL 105  117         Latest Ref Rng & Units 06/21/2024    4:32 AM 06/20/2024    3:15 AM  06/19/2024    3:50 AM  CMP  Glucose 70 - 99 mg/dL 798  734  770   BUN 6 - 20 mg/dL 33  33  37   Creatinine 0.44 - 1.00 mg/dL 8.23  8.11  8.04   Sodium 135 - 145  mmol/L 149  141  138   Potassium 3.5 - 5.1 mmol/L 2.5  3.5  4.4   Chloride 98 - 111 mmol/L 116  110  106   CO2 22 - 32 mmol/L 20  21  20    Calcium  8.9 - 10.3 mg/dL 7.5  7.1  7.6       Microbiology: CSF culture neg so far BC NEg Studies/Results: No results found.   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- so this is not bacteria meningitis like pneumococcus, listeria or hemophilus  Not HSV Pt is currently on meningitis dose ceftriaxone  and vancomycin  Does have maxillary and ethmoid sinusitis on CT head canreduce ceftriaxone  to QD dose  May DC vanco  Will leave it to the intensivist to manage it     The pleocytosis could be due to breach in blood brain barrier like her primary condition VS seizures VS PRES Recommend MRI  brain with contrast    Autoimmune polyendocrine syndrome   Type 1 DM- brittle- 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 Discussed the management with her housemate at bediside      "

## 2024-06-22 DIAGNOSIS — E310 Autoimmune polyglandular failure: Secondary | ICD-10-CM | POA: Diagnosis not present

## 2024-06-22 DIAGNOSIS — E1069 Type 1 diabetes mellitus with other specified complication: Secondary | ICD-10-CM

## 2024-06-22 DIAGNOSIS — N179 Acute kidney failure, unspecified: Secondary | ICD-10-CM | POA: Diagnosis not present

## 2024-06-22 DIAGNOSIS — Z930 Tracheostomy status: Secondary | ICD-10-CM | POA: Diagnosis not present

## 2024-06-22 DIAGNOSIS — I509 Heart failure, unspecified: Secondary | ICD-10-CM

## 2024-06-22 DIAGNOSIS — G9349 Other encephalopathy: Secondary | ICD-10-CM | POA: Diagnosis not present

## 2024-06-22 DIAGNOSIS — E162 Hypoglycemia, unspecified: Secondary | ICD-10-CM | POA: Diagnosis not present

## 2024-06-22 DIAGNOSIS — Z794 Long term (current) use of insulin: Secondary | ICD-10-CM

## 2024-06-22 DIAGNOSIS — I130 Hypertensive heart and chronic kidney disease with heart failure and stage 1 through stage 4 chronic kidney disease, or unspecified chronic kidney disease: Secondary | ICD-10-CM | POA: Diagnosis not present

## 2024-06-22 DIAGNOSIS — E1122 Type 2 diabetes mellitus with diabetic chronic kidney disease: Secondary | ICD-10-CM

## 2024-06-22 DIAGNOSIS — R0902 Hypoxemia: Secondary | ICD-10-CM

## 2024-06-22 DIAGNOSIS — R4182 Altered mental status, unspecified: Secondary | ICD-10-CM | POA: Diagnosis not present

## 2024-06-22 DIAGNOSIS — E1022 Type 1 diabetes mellitus with diabetic chronic kidney disease: Secondary | ICD-10-CM | POA: Diagnosis not present

## 2024-06-22 DIAGNOSIS — E2740 Unspecified adrenocortical insufficiency: Secondary | ICD-10-CM | POA: Diagnosis not present

## 2024-06-22 DIAGNOSIS — N189 Chronic kidney disease, unspecified: Secondary | ICD-10-CM | POA: Diagnosis not present

## 2024-06-22 LAB — CBC
HCT: 25.9 % — ABNORMAL LOW (ref 36.0–46.0)
Hemoglobin: 8.2 g/dL — ABNORMAL LOW (ref 12.0–15.0)
MCH: 30.3 pg (ref 26.0–34.0)
MCHC: 31.7 g/dL (ref 30.0–36.0)
MCV: 95.6 fL (ref 80.0–100.0)
Platelets: 119 10*3/uL — ABNORMAL LOW (ref 150–400)
RBC: 2.71 MIL/uL — ABNORMAL LOW (ref 3.87–5.11)
RDW: 14.2 % (ref 11.5–15.5)
WBC: 7.8 10*3/uL (ref 4.0–10.5)
nRBC: 0 % (ref 0.0–0.2)

## 2024-06-22 LAB — CULTURE, BLOOD (ROUTINE X 2)
Culture: NO GROWTH
Culture: NO GROWTH
Special Requests: ADEQUATE

## 2024-06-22 LAB — CSF CULTURE W GRAM STAIN
Culture: NO GROWTH
Gram Stain: NONE SEEN

## 2024-06-22 LAB — GLUCOSE, CAPILLARY
Glucose-Capillary: 101 mg/dL — ABNORMAL HIGH (ref 70–99)
Glucose-Capillary: 107 mg/dL — ABNORMAL HIGH (ref 70–99)
Glucose-Capillary: 160 mg/dL — ABNORMAL HIGH (ref 70–99)
Glucose-Capillary: 29 mg/dL — CL (ref 70–99)
Glucose-Capillary: 36 mg/dL — CL (ref 70–99)
Glucose-Capillary: 47 mg/dL — ABNORMAL LOW (ref 70–99)
Glucose-Capillary: 53 mg/dL — ABNORMAL LOW (ref 70–99)
Glucose-Capillary: 78 mg/dL (ref 70–99)

## 2024-06-22 LAB — HEPATIC FUNCTION PANEL
ALT: 14 U/L (ref 0–44)
AST: 23 U/L (ref 15–41)
Albumin: 2.8 g/dL — ABNORMAL LOW (ref 3.5–5.0)
Alkaline Phosphatase: 56 U/L (ref 38–126)
Bilirubin, Direct: <0.1 mg/dL (ref 0.0–0.2)
Total Bilirubin: <0.2 mg/dL (ref 0.0–1.2)
Total Protein: 4.9 g/dL — ABNORMAL LOW (ref 6.5–8.1)

## 2024-06-22 LAB — BASIC METABOLIC PANEL WITH GFR
Anion gap: 9 (ref 5–15)
BUN: 32 mg/dL — ABNORMAL HIGH (ref 6–20)
CO2: 19 mmol/L — ABNORMAL LOW (ref 22–32)
Calcium: 7.1 mg/dL — ABNORMAL LOW (ref 8.9–10.3)
Chloride: 127 mmol/L — ABNORMAL HIGH (ref 98–111)
Creatinine, Ser: 1.68 mg/dL — ABNORMAL HIGH (ref 0.44–1.00)
GFR, Estimated: 41 mL/min — ABNORMAL LOW
Glucose, Bld: 83 mg/dL (ref 70–99)
Potassium: 3.4 mmol/L — ABNORMAL LOW (ref 3.5–5.1)
Sodium: 154 mmol/L — ABNORMAL HIGH (ref 135–145)

## 2024-06-22 LAB — PHOSPHORUS: Phosphorus: 1.9 mg/dL — ABNORMAL LOW (ref 2.5–4.6)

## 2024-06-22 LAB — MAGNESIUM: Magnesium: 2 mg/dL (ref 1.7–2.4)

## 2024-06-22 MED ORDER — INSULIN ASPART 100 UNIT/ML IJ SOLN: 0.0000 [IU] | INTRAMUSCULAR | Status: DC

## 2024-06-22 MED ORDER — FREE WATER
200.0000 mL | Status: DC
  Administered 2024-06-22 – 2024-06-23 (×2): 200 mL

## 2024-06-22 MED ORDER — POTASSIUM & SODIUM PHOSPHATES 280-160-250 MG PO PACK
2.0000 | PACK | ORAL | Status: AC
  Administered 2024-06-22 (×4): 2
  Filled 2024-06-22 (×4): qty 2

## 2024-06-22 MED ORDER — INSULIN GLARGINE-YFGN 100 UNIT/ML ~~LOC~~ SOLN
10.0000 [IU] | Freq: Every day | SUBCUTANEOUS | Status: DC
  Administered 2024-06-22: 10 [IU] via SUBCUTANEOUS
  Filled 2024-06-22: qty 0.1

## 2024-06-22 MED ORDER — POTASSIUM CHLORIDE 20 MEQ PO PACK
40.0000 meq | PACK | Freq: Once | ORAL | Status: AC
  Administered 2024-06-22: 40 meq
  Filled 2024-06-22: qty 2

## 2024-06-22 MED ORDER — FENTANYL CITRATE (PF) 50 MCG/ML IJ SOSY
50.0000 ug | PREFILLED_SYRINGE | INTRAMUSCULAR | Status: DC | PRN
  Administered 2024-06-22 – 2024-06-23 (×7): 50 ug via INTRAVENOUS
  Filled 2024-06-22 (×8): qty 1

## 2024-06-22 MED ORDER — DEXTROSE 5 % IV SOLN: INTRAVENOUS | Status: DC

## 2024-06-22 MED ORDER — FREE WATER
200.0000 mL | Status: DC
  Administered 2024-06-22 (×5): 200 mL

## 2024-06-22 MED ORDER — FREE WATER
200.0000 mL | Status: DC
  Administered 2024-06-22: 200 mL

## 2024-06-22 MED ORDER — VITAL 1.5 CAL PO LIQD
1000.0000 mL | ORAL | Status: DC
  Administered 2024-06-22: 1000 mL

## 2024-06-22 MED ORDER — PROSOURCE TF20 ENFIT COMPATIBL EN LIQD
60.0000 mL | Freq: Every day | ENTERAL | Status: DC
  Administered 2024-06-22 – 2024-06-23 (×2): 60 mL
  Filled 2024-06-22 (×4): qty 60

## 2024-06-22 NOTE — Plan of Care (Signed)
 " Problem: Education: Goal: Knowledge of General Education information will improve Description: Including pain rating scale, medication(s)/side effects and non-pharmacologic comfort measures 06/22/2024 2052 by Fowlkes, Lamaya Hyneman, RN Outcome: Not Progressing 06/22/2024 2052 by Fowlkes, Adriyanna Christians, RN Outcome: Progressing   Problem: Health Behavior/Discharge Planning: Goal: Ability to manage health-related needs will improve 06/22/2024 2052 by Fowlkes, Peregrine Nolt, RN Outcome: Not Progressing 06/22/2024 2052 by Fowlkes, Zamyiah Tino, RN Outcome: Progressing   Problem: Clinical Measurements: Goal: Ability to maintain clinical measurements within normal limits will improve 06/22/2024 2052 by Fowlkes, Ladan Vanderzanden, RN Outcome: Not Progressing 06/22/2024 2052 by Fowlkes, Demiya Magno, RN Outcome: Progressing Goal: Will remain free from infection 06/22/2024 2052 by Fowlkes, Stephfon Bovey, RN Outcome: Progressing 06/22/2024 2052 by Fowlkes, Lasandra Batley, RN Outcome: Progressing Goal: Diagnostic test results will improve 06/22/2024 2052 by Fowlkes, Gram Siedlecki, RN Outcome: Not Progressing 06/22/2024 2052 by Fowlkes, Samariyah Cowles, RN Outcome: Progressing Goal: Respiratory complications will improve 06/22/2024 2052 by Fowlkes, Sho Salguero, RN Outcome: Progressing 06/22/2024 2052 by Fowlkes, Myles Mallicoat, RN Outcome: Progressing Goal: Cardiovascular complication will be avoided 06/22/2024 2052 by Fowlkes, Dantae Meunier, RN Outcome: Progressing 06/22/2024 2052 by Fowlkes, Terrion Gencarelli, RN Outcome: Progressing   Problem: Nutrition: Goal: Adequate nutrition will be maintained 06/22/2024 2052 by Carilyn Bunk, RN Outcome: Progressing 06/22/2024 2052 by Fowlkes, Maddoxx Burkitt, RN Outcome: Progressing   Problem: Activity: Goal: Risk for activity intolerance will decrease 06/22/2024 2052 by Fowlkes, Dreden Rivere, RN Outcome: Not Progressing 06/22/2024 2052 by Fowlkes, Miroslava Santellan, RN Outcome: Progressing   Problem: Coping: Goal: Level of anxiety will  decrease 06/22/2024 2052 by Fowlkes, Rylan Kaufmann, RN Outcome: Not Progressing 06/22/2024 2052 by Fowlkes, Jatavia Keltner, RN Outcome: Progressing   Problem: Elimination: Goal: Will not experience complications related to bowel motility 06/22/2024 2052 by Carilyn Bunk, RN Outcome: Progressing 06/22/2024 2052 by Fowlkes, Justyce Baby, RN Outcome: Progressing Goal: Will not experience complications related to urinary retention 06/22/2024 2052 by Carilyn Bunk, RN Outcome: Not Progressing 06/22/2024 2052 by Fowlkes, Dashanna Kinnamon, RN Outcome: Progressing   Problem: Pain Managment: Goal: General experience of comfort will improve and/or be controlled 06/22/2024 2052 by Carilyn Bunk, RN Outcome: Progressing 06/22/2024 2052 by Carilyn Bunk, RN Outcome: Progressing   Problem: Safety: Goal: Ability to remain free from injury will improve 06/22/2024 2052 by Carilyn Bunk, RN Outcome: Progressing 06/22/2024 2052 by Carilyn Bunk, RN Outcome: Progressing   Problem: Skin Integrity: Goal: Risk for impaired skin integrity will decrease 06/22/2024 2052 by Carilyn Bunk, RN Outcome: Progressing 06/22/2024 2052 by Fowlkes, Vaudie Engebretsen, RN Outcome: Progressing   Problem: Education: Goal: Ability to describe self-care measures that may prevent or decrease complications (Diabetes Survival Skills Education) will improve 06/22/2024 2052 by Fowlkes, Laurelai Lepp, RN Outcome: Not Progressing 06/22/2024 2052 by Fowlkes, Teyanna Thielman, RN Outcome: Progressing Goal: Individualized Educational Video(s) 06/22/2024 2052 by Fowlkes, Jahrel Borthwick, RN Outcome: Not Progressing 06/22/2024 2052 by Fowlkes, Tamyia Minich, RN Outcome: Progressing   Problem: Coping: Goal: Ability to adjust to condition or change in health will improve 06/22/2024 2052 by Fowlkes, Nimrod Wendt, RN Outcome: Not Progressing 06/22/2024 2052 by Fowlkes, Aquarius Latouche, RN Outcome: Progressing   Problem: Fluid Volume: Goal: Ability to maintain a balanced  intake and output will improve 06/22/2024 2052 by Fowlkes, Tondra Reierson, RN Outcome: Not Progressing 06/22/2024 2052 by Fowlkes, Akiya Morr, RN Outcome: Progressing   Problem: Health Behavior/Discharge Planning: Goal: Ability to identify and utilize available resources and services will improve 06/22/2024 2052 by Fowlkes, Alaja Goldinger, RN Outcome: Not Progressing 06/22/2024 2052 by Fowlkes, Xavius Spadafore, RN Outcome: Progressing Goal: Ability to manage health-related needs will improve 06/22/2024 2052 by Fowlkes, Deston Bilyeu, RN Outcome: Not Progressing 06/22/2024 2052 by Carilyn,  Brittanya Winburn, RN Outcome: Progressing   Problem: Metabolic: Goal: Ability to maintain appropriate glucose levels will improve 06/22/2024 2052 by Fowlkes, Lacora Folmer, RN Outcome: Not Progressing 06/22/2024 2052 by Fowlkes, Harlei Lehrmann, RN Outcome: Progressing   Problem: Nutritional: Goal: Maintenance of adequate nutrition will improve 06/22/2024 2052 by Fowlkes, Cosmo Tetreault, RN Outcome: Progressing 06/22/2024 2052 by Fowlkes, Stokes Rattigan, RN Outcome: Progressing Goal: Progress toward achieving an optimal weight will improve 06/22/2024 2052 by Carilyn Bunk, RN Outcome: Progressing 06/22/2024 2052 by Fowlkes, Aliceson Dolbow, RN Outcome: Progressing   Problem: Skin Integrity: Goal: Risk for impaired skin integrity will decrease 06/22/2024 2052 by Carilyn Bunk, RN Outcome: Not Progressing 06/22/2024 2052 by Fowlkes, Janita Camberos, RN Outcome: Progressing   Problem: Tissue Perfusion: Goal: Adequacy of tissue perfusion will improve 06/22/2024 2052 by Fowlkes, Dredyn Gubbels, RN Outcome: Progressing 06/22/2024 2052 by Fowlkes, Stephine Langbehn, RN Outcome: Progressing   Problem: Cardiac: Goal: Ability to maintain an adequate cardiac output will improve 06/22/2024 2052 by Carilyn Bunk, RN Outcome: Not Progressing 06/22/2024 2052 by Fowlkes, Zaelyn Noack, RN Outcome: Progressing   Problem: Metabolic: Goal: Ability to maintain appropriate glucose levels  will improve 06/22/2024 2052 by Fowlkes, Carling Liberman, RN Outcome: Not Progressing 06/22/2024 2052 by Fowlkes, Shelvy Perazzo, RN Outcome: Progressing   Problem: Nutritional: Goal: Maintenance of adequate nutrition will improve 06/22/2024 2052 by Fowlkes, Brynnly Bonet, RN Outcome: Progressing 06/22/2024 2052 by Fowlkes, Lori-Ann Lindfors, RN Outcome: Progressing Goal: Maintenance of adequate weight for body size and type will improve 06/22/2024 2052 by Carilyn Bunk, RN Outcome: Progressing 06/22/2024 2052 by Fowlkes, Hattie Aguinaldo, RN Outcome: Progressing   Problem: Respiratory: Goal: Will regain and/or maintain adequate ventilation 06/22/2024 2052 by Carilyn Bunk, RN Outcome: Progressing 06/22/2024 2052 by Fowlkes, Batina Dougan, RN Outcome: Progressing   Problem: Urinary Elimination: Goal: Ability to achieve and maintain adequate renal perfusion and functioning will improve 06/22/2024 2052 by Carilyn Bunk, RN Outcome: Not Progressing 06/22/2024 2052 by Fowlkes, Kerly Rigsbee, RN Outcome: Progressing   "

## 2024-06-22 NOTE — Progress Notes (Signed)
 "  NAME:  Ashley Camacho, MRN:  [id number], DOB:  [birth year], LOS: 5 ADMISSION DATE:  06/17/2024, CONSULTATION DATE: 06/17/2024 REFERRING MD: Dr. Floy, CHIEF COMPLAINT: Unresponsiveness    Brief Pt Description / Synopsis:  34 y.o. female with PMHx most significant for Type I DM admitted with Acute Metabolic Encephalopathy due to severe hypoglycemia and presumed Neuroglycopenia, Acute Hypoxic Respiratory Failure and suspected pneumonia, requiring intubation for airway protection.    History of Present Illness:  34 y/o female with a medical history as indicated below whop presented to the ED via EMS after being found unresponsive. History is obtained from ED records, EMS records and from speaking with patient's mother. Per EMS records, patient was found unresponsive by her friend. Total downtime is uncertain. Blood glucose with EMS was 27 mg/dl. She was given an amp of glucagon  and blood glucose improved to 44 mg/dl.   Upon arrival in the ED, patient was still unresponsive, hypothermic, hypoxic on 3L [place] and pupils were dilated. An I/O was placed in her right leg. She given an amp of D50. Glucose improved to 252 mg/dl. Despite increase in blood glucose, she remained unresponsive and hypoxic hence she was emergently intubated. PCCM consulted for further management.  ED work-up: CT head negative. Chest x-ray shows left lower lobe atelectasis. Labs pending   Of note, patient was diagnosed with influenza A on 05/27/24 and was hospitalized from 1/4-05/30/2024. Per patient's mother, she is a very fragile diabetic. She states that she has been buying groceries for patient because she is not able to afford food. Patient lives with friends. Unfortunately she does not have the contact information for patient's friend who called EMS or the friends with whom she live  Pertinent  Medical History   Past Medical History:  Diagnosis Date   Acute metabolic encephalopathy 06/10/2022   Adrenal insufficiency    Anemia     Anxiety    Blood transfusion without reported diagnosis    CHF (congestive heart failure) (HCC)    Chronic kidney disease    Depression    Gastroparesis    Hypertension    Hypotension    Intractable nausea and vomiting 11/13/2022   Migraine    Thyroid  disease    Type 1 diabetes (HCC)    Micro Data:  01/25: MRSA PCR>>negative 01/25: Urine>>klebsiella pneumoniae  01/25: Blood x2>>negative 01/25: RVP>>negative  01/25: Tracheal aspirate>> 01/26: COVID>>negative  1/27: Encephalitis panel>>negative  1/27: CSF>> No growth to date  Anti-infectives (From admission, onward)    Start     Dose/Rate Route Frequency Ordered Stop   06/22/24 1000  cefTRIAXone  (ROCEPHIN ) 2 g in sodium chloride  0.9 % 100 mL IVPB  Status:  Discontinued        2 g 200 mL/hr over 30 Minutes Intravenous Every 24 hours 06/21/24 1029 06/21/24 1030   06/22/24 1000  cefTRIAXone  (ROCEPHIN ) 2 g in sodium chloride  0.9 % 100 mL IVPB        2 g 200 mL/hr over 30 Minutes Intravenous Every 24 hours 06/21/24 1030 06/22/24 2359   06/19/24 1000  cefTRIAXone  (ROCEPHIN ) 2 g in sodium chloride  0.9 % 100 mL IVPB  Status:  Discontinued        2 g 200 mL/hr over 30 Minutes Intravenous Every 12 hours 06/19/24 0008 06/21/24 1029   06/19/24 0130  acyclovir  (ZOVIRAX ) 550 mg in dextrose  5 % 100 mL IVPB  Status:  Discontinued        10 mg/kg  55.2 kg 111 mL/hr over  60 Minutes Intravenous Every 12 hours 06/19/24 0039 06/20/24 0949   06/19/24 0130  vancomycin  (VANCOREADY) IVPB 750 mg/150 mL  Status:  Discontinued        750 mg 150 mL/hr over 60 Minutes Intravenous Every 24 hours 06/19/24 0042 06/21/24 1029   06/19/24 0115  cefTRIAXone  (ROCEPHIN ) 1 g in sodium chloride  0.9 % 100 mL IVPB        1 g 200 mL/hr over 30 Minutes Intravenous  Once 06/19/24 0018 06/19/24 0127   06/17/24 1700  cefTRIAXone  (ROCEPHIN ) 1 g in sodium chloride  0.9 % 100 mL IVPB  Status:  Discontinued        1 g 200 mL/hr over 30 Minutes Intravenous Every 24 hours  06/17/24 1558 06/19/24 0008   06/17/24 1600  azithromycin  (ZITHROMAX ) tablet 500 mg        500 mg Per Tube Daily 06/17/24 1558 06/22/24 2359   06/17/24 1600  vancomycin  (VANCOCIN ) IVPB 1000 mg/200 mL premix  Status:  Discontinued        1,000 mg 200 mL/hr over 60 Minutes Intravenous  Once 06/17/24 1558 06/17/24 1952      Significant Hospital Events: Including procedures, antibiotic start and stop dates in addition to other pertinent events   01/25: Admitted with severe hypoglycemia and acute hypoxic respiratory failure  01/26: Pt now with DKA requiring insulin  gtt remains mechanically intubated on minimal vent settings.  Mentation precluding extubation at this time  01/27: Pt transitioned off insulin  gtt 01/26.  MRI Brain negative due to continued acute encephalopathy due to concerns of possible meningitis continuing cetriaxone and added vancomycin /acyclovir  and pt pending LP today.  Pts hgb 6.3 with no signs of active bleeding receiving 1 unit pRBC's 1/28: No acute events overnight, remains encephalopathic which is precluding extubation.  Hemodynamically stable, no vasopressors. LP workup inconclusive, consult ID.  Decreased Solu-cortef  given hypertension. 1/29: Mentation precluding extubation. Discontinue Vancomycin  per ID recommendation. Deescalated steroids due to HTN.  1/30: No acute events overnight. Hypoglycemia to 50s, decreased Lantus  to daily.  Interim History / Subjective:  Patient remains encephalopathic despite being off pressors.  It is more convincing that this is secondary to hypoglycemia.   Objective    Blood pressure 138/72, pulse 94, temperature 99.1 F (37.3 C), resp. rate 17, height 5' 0.98 (1.549 m), weight 61.6 kg, SpO2 100%.    Vent Mode: PRVC FiO2 (%):  [30 %] 30 % Set Rate:  [18 bmp] 18 bmp Vt Set:  [400 mL] 400 mL PEEP:  [5 cmH20] 5 cmH20 Plateau Pressure:  [13 cmH20-15 cmH20] 13 cmH20   Intake/Output Summary (Last 24 hours) at 06/22/2024 0857 Last data  filed at 06/22/2024 9148 Gross per 24 hour  Intake 2578.73 ml  Output 725 ml  Net 1853.73 ml   Filed Weights   06/20/24 0500 06/21/24 0440 06/22/24 0500  Weight: 63.3 kg 62.3 kg 61.6 kg    Examination: General: Acutely-ill appearing female sedated not following commands, NAD mechanically intubated  HENT: Supple, no JVD  Lungs: Clear throughout, even, non labored  Cardiovascular: Sinus rhythm, s1s2, no m/r/g, 1+ radial/1+ distal pulses, no edema  Abdomen: +BS x4, soft, non distended  Extremities: Normal bulk and tone  Neuro: Sedated, moving all extremities spontaneously but unable to follow commands, bilateral pupils 2 mm sluggish  GU: Indwelling foley catheter in place draining yellow urine   Labs and imaging were reviewed.  Assessment and Plan  #Toxic Metabolic Encephalopathy #Acute Hypoxic Respiratory Failure #Severe Hypoglycemia  #Neuroglycopenia #Type 1  DM  #Adrenal Insufficiency #CKD   Neurology - found down and encephalopathic, with severe hypoglycemia. Presentation is highly concerning for neuroglycopenia, and she is currently intubated and mechanically ventilated. No sign of seizure on presentation or following admission. Off sedation and remains unresponsive, with only response being withdrawal to pain and eye opening. MRI of the brain was unremarkable, and she has no signs of nuchal rigidity on exam. LP performed, cell count with neutrophil predominant pleocytosis. Meningitis/Encephalitis panel was negative, and we've consulted with ID for further evaluation.  -Discontinued meningitis coverage. -off sedation   - Appreciate Neurology recs.   Cardiovascular - has not required vasopressors, and has responded well to volume resuscitation.  - DC Aline, DC central line.    Pulmonary - intubated due to encephalopathy, inability to protect her airway. Currently mechanically ventilated with minimal vent settings. Mental status is barrier to extubation.   Gastrointestinal -  Started tube feeds   Renal - she has CKD with kidney function appearing close to baseline. No major electrolyte abnormalities. on IV fluids.  - DC urethral catheter   - Increase Water  flushes given hyperna.    Endocrine - severe hypoglycemia on presentation, responded to D50. Underlying T1DM, and subsequently developed DKA shortly after admission. Unclear if her hypoglycemic episode was due to sepsis, iatrogenic from insulin , or due to food insecurity. Adrenal crisis also a possibility in the setting of her known adrenal insufficiency.              -basal bolus regimen for insulin  - Adjust as patient was hyopglycemic              -continue hydrocortisone              -continue levothyroxine    Hem/Onc - DVT prophylaxis with heparin  subQ ordered   ID - broad spectrum antibiotics to cover for sepsis. Receiving empiric meningitis pending culture results. Consulted with ID regarding pleocytosis, recs appreciated. Did discontinue meningitis coverage.             -follow CSF cultures  Critical Care Time: 60 minutes    Darrin Barn, MD [facility] 06/22/2024 12:54 PM          "

## 2024-06-22 NOTE — Progress Notes (Signed)
 Ceribell Device applied to patient per order

## 2024-06-22 NOTE — Consult Note (Signed)
 Reason for Consult:AMS Requesting Physician: Assaker  CC: AMS  I have been asked by Dr. Malka to see this patient in consultation for AMS.  HPI: Ashley Camacho is an 34 y.o. female with a medical history of adrenal insufficiency, CHF, CKD, HTN, DM, thyroid , as indicated below who presented to the ED via EMS after being found unresponsive. History is obtained from chart since patient intubated at this time.  Patient was found unresponsive by her friend. Total downtime is uncertain. Blood glucose with EMS was 27 mg/dl. She was given an amp of glucagon  and blood glucose improved to 44 mg/dl.   Upon arrival in the ED, patient was still unresponsive, hypothermic, hypoxic on 3L [place] and pupils were dilated. An I/O was placed in her right leg. She given an amp of D50. Glucose improved to 252 mg/dl. Despite increase in blood glucose, she remained unresponsive and hypoxic hence she was emergently intubated. PCCM consulted for further management.  ED work-up: CT head and MRI of the brain were unremarkable. Routine EEG on 1/26 was only significant for slowing.  LP only remarkable for 41 wbcs and elevated glucose.  Cultures and meningitis/encephalitis panel were negative.     Patient now off sedation for 24 hours and still not fully responsive.    Past Medical History:  Diagnosis Date   Acute metabolic encephalopathy 06/10/2022   Adrenal insufficiency    Anemia    Anxiety    Blood transfusion without reported diagnosis    CHF (congestive heart failure) (HCC)    Chronic kidney disease    Depression    Gastroparesis    Hypertension    Hypotension    Intractable nausea and vomiting 11/13/2022   Migraine    Thyroid  disease    Type 1 diabetes [facility])     Past Surgical History:  Procedure Laterality Date   BIOPSY  01/14/2021   Procedure: BIOPSY;  Surgeon: Eda Iha, MD;  Location: [facility] ENDOSCOPY;  Service: Gastroenterology;;   CENTRAL LINE INSERTION N/A 10/10/2023   Procedure: CENTRAL LINE  INSERTION;  Surgeon: Marea Selinda RAMAN, MD;  Location: [facility] INVASIVE CV LAB;  Service: Cardiovascular;  Laterality: N/A;   CESAREAN SECTION     x2   CESAREAN SECTION WITH BILATERAL TUBAL LIGATION N/A 02/17/2021   Procedure: CESAREAN SECTION WITH BILATERAL TUBAL LIGATION;  Surgeon: Barbra Lang PARAS, DO;  Location: [facility] LD ORS;  Service: Obstetrics;  Laterality: N/A;   ESOPHAGOGASTRODUODENOSCOPY (EGD) WITH PROPOFOL  N/A 01/14/2021   Procedure: ESOPHAGOGASTRODUODENOSCOPY (EGD) WITH PROPOFOL ;  Surgeon: Eda Iha, MD;  Location: [facility] ENDOSCOPY;  Service: Gastroenterology;  Laterality: N/A;   IR BONE MARROW BIOPSY & ASPIRATION  12/16/2022   IR REPLC DUODEN/JEJUNO TUBE PERCUT W/FLUORO  11/22/2022   JEJUNOSTOMY N/A 06/13/2022   Procedure: JEJUNOSTOMY;  Surgeon: Jordis Laneta FALCON, MD;  Location: [facility] ORS;  Service: General;  Laterality: N/A;   MOUTH SURGERY     TEE WITHOUT CARDIOVERSION N/A 01/06/2024   Procedure: ECHOCARDIOGRAM, TRANSESOPHAGEAL;  Surgeon: Rolan Ezra RAMAN, MD;  Location: [facility] ORS;  Service: Cardiovascular;  Laterality: N/A;   TUBAL LIGATION      Family History  Problem Relation Age of Onset   Breast cancer Mother    Seizures Mother    Cancer Mother    Diabetes type I Father    CAD Father    Diabetes Father    Early death Father    Lung cancer Maternal Grandfather    CAD Paternal Grandmother    Diabetes Paternal Grandmother  CAD Paternal Grandfather    Diabetes Paternal Grandfather    ADD / ADHD Daughter    Diabetes Maternal Uncle    Ovarian cancer Neg Hx     Social History:  reports that she has never smoked. She has never been exposed to tobacco smoke. She has never used smokeless tobacco. She reports that she does not drink alcohol and does not use drugs.  Allergies[1]  Medications: I have reviewed the patient's current medications. Scheduled:  Chlorhexidine  Gluconate Cloth  6 each Topical Daily   feeding supplement (PROSource TF20)  60 mL Per Tube Daily   free  water   200 mL Per Tube Q4H   heparin   5,000 Units Subcutaneous Q8H   hydrocortisone  sod succinate (SOLU-CORTEF ) inj  50 mg Intravenous Q12H   insulin  aspart  0-9 Units Subcutaneous Q4H   insulin  glargine-yfgn  10 Units Subcutaneous QHS   labetalol   10 mg Intravenous Q12H   levothyroxine   75 mcg Per NG tube Q0600   mouth rinse  15 mL Mouth Rinse Q2H   pantoprazole  (PROTONIX ) IV  40 mg Intravenous Q12H   polyethylene glycol  17 g Per Tube Daily   potassium & sodium phosphates   2 packet Per Tube Q4H   rosuvastatin   10 mg Per NG tube Daily   senna  1 tablet Per Tube BID   thiamine   100 mg Per Tube Daily    ROS: Unable to provide due to intubation  Physical Examination: Blood pressure (!) 157/77, pulse 92, temperature 99.5 F (37.5 C), resp. rate 18, height 5' 0.98 (1.549 m), weight 61.6 kg, SpO2 100%.  Mental Status: Patient does not respond to verbal stimuli.  Grimaces with deep sternal rub.  Does not follow commands.  No verbalizations noted.  Cranial Nerves: II: patient does not respond confrontation bilaterally, pupils right 4 mm, left 4 mm,and reactive bilaterally III,IV,VI: Oculocephalic response present bilaterally. V,VII: corneal reflex present bilaterally  VIII: patient does not respond to verbal stimuli IX,X: gag reflex present, XI: trapezius strength unable to test bilaterally XII: tongue strength unable to test Motor: Extremities flaccid in the upper extremities.  Trace movements noted in the lower extremities Sensory: Grimaces to noxious stimuli in all extremities. Deep Tendon Reflexes:  2+ throughout with absent AJ's bilaterally Plantars: downgoing bilaterally Cerebellar: Unable to perform    Laboratory Studies:   Basic Metabolic Panel: Recent Labs  Lab 06/18/24 0440 06/18/24 0602 06/19/24 0350 06/19/24 1152 06/20/24 0315 06/21/24 0432 06/21/24 1814 06/22/24 0302  NA 136   < > 138  --  141 149* 154* 154*  K 7.1*   < > 4.4  --  3.5 2.5* 3.9 3.4*  CL  103   < > 106  --  110 116* 125* 127*  CO2 12*   < > 20*  --  21* 20* 20* 19*  GLUCOSE 443*   < > 229*  --  265* 201* 122* 83  BUN 38*   < > 37*  --  33* 33* 34* 32*  CREATININE 1.93*   < > 1.95*  --  1.88* 1.76* 1.75* 1.68*  CALCIUM  8.1*   < > 7.6*  --  7.1* 7.5* 7.5* 7.1*  MG 1.8  --  1.6* 2.5* 2.2 2.2  --  2.0  PHOS 3.6  --  2.8  --  2.6 3.1  --  1.9*   < > = values in this interval not displayed.    Liver Function Tests: Recent Labs  Lab 06/17/24 1600  AST 32  ALT 11  ALKPHOS 59  BILITOT 0.3  PROT 5.9*  ALBUMIN  3.5   No results for input(s): LIPASE, AMYLASE in the last 168 hours. No results for input(s): AMMONIA in the last 168 hours.  CBC: Recent Labs  Lab 06/17/24 1600 06/18/24 0440 06/19/24 0350 06/19/24 1152 06/19/24 1830 06/20/24 0315 06/21/24 0432 06/22/24 0302  WBC 5.8   < > 16.0* 14.0*  --  9.9 6.9 7.8  NEUTROABS 4.8  --   --   --   --   --   --   --   HGB 8.4*   < > 6.3* 8.4* 8.2* 8.0* 8.9* 8.2*  HCT 25.4*   < > 19.7* 24.3* 24.3* 23.5* 26.5* 25.9*  MCV 91.4   < > 92.9 88.0  --  88.3 90.4 95.6  PLT 84*   < > 129* 118*  --  117* 105* 119*   < > = values in this interval not displayed.    Cardiac Enzymes: No results for input(s): CKTOTAL, CKMB, CKMBINDEX, TROPONINI in the last 168 hours.  BNP: Invalid input(s): POCBNP  CBG: Recent Labs  Lab 06/21/24 2321 06/22/24 0309 06/22/24 0729 06/22/24 0753 06/22/24 1109  GLUCAP 105* 78 53* 160* 107*    Microbiology: Results for orders placed or performed during the hospital encounter of 06/17/24  MRSA Next Gen by PCR, Nasal     Status: None   Collection Time: 06/17/24  3:33 PM   Specimen: Nasal Mucosa; Nasal Swab  Result Value Ref Range Status   MRSA by PCR Next Gen NOT DETECTED NOT DETECTED Final    Comment: (NOTE) The GeneXpert MRSA Assay (FDA approved for NASAL specimens only), is one component of a comprehensive MRSA colonization surveillance program. It is not intended to  diagnose MRSA infection nor to guide or monitor treatment for MRSA infections. Test performance is not FDA approved in patients less than 59 years old. Performed at [facility], [address] [place], [place] [address]   Urine Culture     Status: Abnormal   Collection Time: 06/17/24  3:38 PM   Specimen: Urine, Random  Result Value Ref Range Status   Specimen Description   Final    URINE, RANDOM Performed at [facility], [address] [place], [place] [address]    Special Requests   Final    NONE Reflexed from [id number] Performed at [facility], [address] [place], [place] [address]    Culture >=100,000 COLONIES/mL KLEBSIELLA PNEUMONIAE (A)  Final   Report Status 06/19/2024 FINAL  Final   Organism ID, Bacteria KLEBSIELLA PNEUMONIAE (A)  Final      Susceptibility   Klebsiella pneumoniae - MIC*    AMPICILLIN  >=32 RESISTANT Resistant     CEFAZOLIN (URINE) Value in next row Sensitive      2 SENSITIVEThis is a modified FDA-approved test that has been validated and its performance characteristics determined by the reporting laboratory.  This laboratory is certified under the Clinical Laboratory Improvement Amendments CLIA as qualified to perform high complexity clinical laboratory testing.    CEFEPIME  Value in next row Sensitive      2 SENSITIVEThis is a modified FDA-approved test that has been validated and its performance characteristics determined by the reporting laboratory.  This laboratory is certified under the Clinical Laboratory Improvement Amendments CLIA as qualified to perform high complexity clinical laboratory testing.    ERTAPENEM Value in next row Sensitive      2 SENSITIVEThis  is a modified FDA-approved test that has been validated and its performance characteristics determined by the reporting laboratory.  This laboratory is certified under the Clinical Laboratory Improvement Amendments CLIA as qualified to perform high complexity  clinical laboratory testing.    CEFTRIAXONE  Value in next row Sensitive      2 SENSITIVEThis is a modified FDA-approved test that has been validated and its performance characteristics determined by the reporting laboratory.  This laboratory is certified under the Clinical Laboratory Improvement Amendments CLIA as qualified to perform high complexity clinical laboratory testing.    CIPROFLOXACIN Value in next row Sensitive      2 SENSITIVEThis is a modified FDA-approved test that has been validated and its performance characteristics determined by the reporting laboratory.  This laboratory is certified under the Clinical Laboratory Improvement Amendments CLIA as qualified to perform high complexity clinical laboratory testing.    GENTAMICIN Value in next row Sensitive      2 SENSITIVEThis is a modified FDA-approved test that has been validated and its performance characteristics determined by the reporting laboratory.  This laboratory is certified under the Clinical Laboratory Improvement Amendments CLIA as qualified to perform high complexity clinical laboratory testing.    NITROFURANTOIN Value in next row Resistant      2 SENSITIVEThis is a modified FDA-approved test that has been validated and its performance characteristics determined by the reporting laboratory.  This laboratory is certified under the Clinical Laboratory Improvement Amendments CLIA as qualified to perform high complexity clinical laboratory testing.    TRIMETH /SULFA  Value in next row Sensitive      2 SENSITIVEThis is a modified FDA-approved test that has been validated and its performance characteristics determined by the reporting laboratory.  This laboratory is certified under the Clinical Laboratory Improvement Amendments CLIA as qualified to perform high complexity clinical laboratory testing.    AMPICILLIN /SULBACTAM Value in next row Sensitive      2 SENSITIVEThis is a modified FDA-approved test that has been validated and its  performance characteristics determined by the reporting laboratory.  This laboratory is certified under the Clinical Laboratory Improvement Amendments CLIA as qualified to perform high complexity clinical laboratory testing.    PIP/TAZO Value in next row Sensitive      <=4 SENSITIVEThis is a modified FDA-approved test that has been validated and its performance characteristics determined by the reporting laboratory.  This laboratory is certified under the Clinical Laboratory Improvement Amendments CLIA as qualified to perform high complexity clinical laboratory testing.    MEROPENEM Value in next row Sensitive      <=4 SENSITIVEThis is a modified FDA-approved test that has been validated and its performance characteristics determined by the reporting laboratory.  This laboratory is certified under the Clinical Laboratory Improvement Amendments CLIA as qualified to perform high complexity clinical laboratory testing.    * >=100,000 COLONIES/mL KLEBSIELLA PNEUMONIAE  Blood culture (routine x 2)     Status: None   Collection Time: 06/17/24  4:00 PM   Specimen: BLOOD  Result Value Ref Range Status   Specimen Description BLOOD RIGHT ANTECUBITAL  Final   Special Requests   Final    BOTTLES DRAWN AEROBIC AND ANAEROBIC Blood Culture results may not be optimal due to an inadequate volume of blood received in culture bottles   Culture   Final    NO GROWTH 5 DAYS Performed at [facility], [address], [place], [place] [address]    Report Status 06/22/2024 FINAL  Final  Respiratory (~20 pathogens) panel  by PCR     Status: None   Collection Time: 06/17/24  6:20 PM   Specimen: Nasopharyngeal Swab; Respiratory  Result Value Ref Range Status   Adenovirus NOT DETECTED NOT DETECTED Final   Coronavirus 229E NOT DETECTED NOT DETECTED Final    Comment: (NOTE) The Coronavirus on the Respiratory Panel, DOES NOT test for the novel  Coronavirus (2019 nCoV)    Coronavirus HKU1 NOT DETECTED NOT  DETECTED Final   Coronavirus NL63 NOT DETECTED NOT DETECTED Final   Coronavirus OC43 NOT DETECTED NOT DETECTED Final   Metapneumovirus NOT DETECTED NOT DETECTED Final   Rhinovirus / Enterovirus NOT DETECTED NOT DETECTED Final   Influenza A NOT DETECTED NOT DETECTED Final   Influenza B NOT DETECTED NOT DETECTED Final   Parainfluenza Virus 1 NOT DETECTED NOT DETECTED Final   Parainfluenza Virus 2 NOT DETECTED NOT DETECTED Final   Parainfluenza Virus 3 NOT DETECTED NOT DETECTED Final   Parainfluenza Virus 4 NOT DETECTED NOT DETECTED Final   Respiratory Syncytial Virus NOT DETECTED NOT DETECTED Final   Bordetella pertussis NOT DETECTED NOT DETECTED Final   Bordetella Parapertussis NOT DETECTED NOT DETECTED Final   Chlamydophila pneumoniae NOT DETECTED NOT DETECTED Final   Mycoplasma pneumoniae NOT DETECTED NOT DETECTED Final    Comment: Performed at [facility], [address] [place], [place] [address]  Blood culture (routine x 2)     Status: None   Collection Time: 06/17/24  7:29 PM   Specimen: BLOOD  Result Value Ref Range Status   Specimen Description BLOOD Pic Line  Final   Special Requests   Final    BOTTLES DRAWN AEROBIC ONLY Blood Culture adequate volume   Culture   Final    NO GROWTH 5 DAYS Performed at [facility], [address] [place], [place] [address]    Report Status 06/22/2024 FINAL  Final  SARS Coronavirus 2 by RT PCR (hospital order, performed in [facility] hospital lab) *cepheid single result test* Anterior Nasal Swab     Status: None   Collection Time: 06/18/24  8:15 PM   Specimen: Anterior Nasal Swab  Result Value Ref Range Status   SARS Coronavirus 2 by RT PCR NEGATIVE NEGATIVE Final    Comment: (NOTE) SARS-CoV-2 target nucleic acids are NOT DETECTED.  The SARS-CoV-2 RNA is generally detectable in upper and lower respiratory specimens during the acute phase of infection. The lowest concentration of SARS-CoV-2 viral copies this assay can  detect is 250 copies / mL. A negative result does not preclude SARS-CoV-2 infection and should not be used as the sole basis for treatment or other patient management decisions.  A negative result may occur with improper specimen collection / handling, submission of specimen other than nasopharyngeal swab, presence of viral mutation(s) within the areas targeted by this assay, and inadequate number of viral copies (<250 copies / mL). A negative result must be combined with clinical observations, patient history, and epidemiological information.  Fact Sheet for Patients:   roadlaptop.co.za  Fact Sheet for Healthcare Providers: http://kim-miller.com/  This test is not yet approved or  cleared by the [place]  FDA and has been authorized for detection and/or diagnosis of SARS-CoV-2 by FDA under an Emergency Use Authorization (EUA).  This EUA will remain in effect (meaning this test can be used) for the duration of the COVID-19 declaration under Section 564(b)(1) of the Act, 21 U.S.C. section 360bbb-3(b)(1), unless the authorization is terminated or revoked sooner.  Performed at [facility], [address], [place], [place] [address]   CSF culture w Gram Stain     Status: None   Collection Time: 06/19/24  2:33 PM   Specimen: PATH Cytology CSF; Cerebrospinal Fluid  Result Value Ref Range Status   Specimen Description   Final    CSF Performed at [facility], [address], [place], [place] [address]    Special Requests   Final    NONE Performed at [facility], [address] [place], [place] [address]    Gram Stain   Final    NO ORGANISMS SEEN WBC SEEN RED BLOOD CELLS PRESENT Performed at [facility], [address], [place], [place] [address]    Culture   Final    NO GROWTH 3 DAYS Performed at [facility], [address], [place], [place] [address]    Report Status  06/22/2024 FINAL  Final  Culture, Fungus without Smear     Status: None (Preliminary result)   Collection Time: 06/19/24  2:33 PM   Specimen: PATH Cytology CSF; Cerebrospinal Fluid  Result Value Ref Range Status   Specimen Description   Final    CSF Performed at [facility], [address] [place], [place] [address]    Special Requests   Final    NONE Performed at [facility], [address] [place], [place] [address]    Culture   Final    NO FUNGUS ISOLATED AFTER 3 DAYS Performed at [facility], [address], [place], [place] [address]    Report Status PENDING  Incomplete  Anaerobic culture w Gram Stain     Status: None (Preliminary result)   Collection Time: 06/19/24  2:33 PM   Specimen: PATH Cytology CSF; Cerebrospinal Fluid  Result Value Ref Range Status   Specimen Description   Final    CSF Performed at [facility], [address] [place], [place] [address]    Special Requests   Final    NONE Performed at [facility], [address] [place], [place] [address]    Culture   Final    NO ANAEROBES ISOLATED; CULTURE IN PROGRESS FOR 5 DAYS   Report Status PENDING  Incomplete    Coagulation Studies: No results for input(s): LABPROT, INR in the last 72 hours.  Urinalysis:  Recent Labs  Lab 06/17/24 1538  COLORURINE YELLOW*  LABSPEC 1.010  PHURINE 5.0  GLUCOSEU >=500*  HGBUR SMALL*  BILIRUBINUR NEGATIVE  KETONESUR NEGATIVE  PROTEINUR 100*  NITRITE NEGATIVE  LEUKOCYTESUR SMALL*    Lipid Panel:     Component Value Date/Time   CHOL 171 12/26/2023 1546   CHOL 133 05/10/2023 1405   TRIG 63 06/19/2024 0350   HDL 65 12/26/2023 1546   HDL 45 05/10/2023 1405   CHOLHDL 2.6 12/26/2023 1546   VLDL 22 12/26/2023 1546   LDLCALC 84 12/26/2023 1546   LDLCALC 49 05/10/2023 1405    HgbA1C:  Lab Results  Component Value Date   HGBA1C 7.1 (H) 05/27/2024    Urine Drug Screen:      Component Value Date/Time    LABOPIA NEGATIVE 06/17/2024 1552   COCAINSCRNUR NEGATIVE 06/17/2024 1552   COCAINSCRNUR NONE DETECTED 03/26/2024 1519   LABBENZ NEGATIVE 06/17/2024 1552   AMPHETMU NEGATIVE 06/17/2024 1552   THCU NEGATIVE 06/17/2024 1552   LABBARB NEGATIVE 06/17/2024 1552    Alcohol Level: No results for input(s): ETH in the last 168 hours.  Other results: EKG: sinus tachycardia at 117 bpm  Imaging: MRI HEAD WITHOUT CONTRAST   TECHNIQUE: Multiplanar, multiecho pulse sequences of the brain and surrounding structures were obtained without intravenous contrast.   COMPARISON:  CT from 06/17/2024.   FINDINGS: Brain: Cerebral volume within normal limits for age. No focal parenchymal signal abnormality.   No abnormal foci of restricted diffusion to suggest acute or subacute ischemia. Gray-white matter differentiation well maintained. No encephalomalacia to suggest chronic cortical infarction or other insult. No foci of susceptibility artifact indicative of acute or chronic intracranial blood products.   No mass lesion, midline shift or mass effect. Ventricles normal in size and morphology without hydrocephalus. No extra-axial fluid collection.   Pituitary gland and suprasellar region within normal limits.   Vascular: Major intracranial vascular flow voids are well maintained.   Skull and upper cervical spine: Craniocervical junction within normal limits. Visualized upper cervical spine demonstrates no significant finding. Bone marrow signal intensity within normal limits. No scalp soft tissue abnormality.   Sinuses/Orbits: Globes and orbital soft tissues are within normal limits.   Moderate mucosal thickening present throughout the paranasal sinuses. Small bilateral mastoid effusions. Patient is intubated.   Other: None.   IMPRESSION: Normal brain MRI. No acute intracranial abnormality identified.   Assessment/Plan: 34 y.o. female with a medical history of adrenal  insufficiency, CHF, CKD, HTN, DM, thyroid , as indicated below who presented to the ED via EMS after being found unresponsive. History is obtained from chart since patient intubated at this time.  Patient was found unresponsive by her friend. Total downtime is uncertain. Blood glucose with EMS was 27 mg/dl. She was given an amp of glucagon  and blood glucose improved to 44 mg/dl.   Upon arrival in the ED, patient was still unresponsive, hypothermic, hypoxic on 3L [address] and pupils were dilated. An I/O was placed in her right leg. She given an amp of D50. Glucose improved to 252 mg/dl. Despite increase in blood glucose, she remained unresponsive and hypoxic hence she was emergently intubated. PCCM consulted for further management.  ED work-up: CT head and MRI of the brain were unremarkable. Routine EEG on 1/26 was only significant for slowing.  LP only remarkable for 41 wbcs and elevated glucose.  Cultures and meningitis/encephalitis panel were negative.     Patient now off sedation for 24 hours and still not fully responsive.  Does have brainstem responses.  Multiple metabolic issues and although with EEG that did not show epileptiform activity initially this was only a spot check of brain activity.   Mental status likely multifactorial in etiology and patient may just require more time before she is  able to improve further.  Would still rule out non convulsive seizure activity and check for other metabolic issues as well.  Based on response may require repeat imaging as well.    Recommendations: Ceribell to be placed for about 24 hours Agree with addressing metabolic issues and would also check hepatic function.   Will continue to follow with you  Case discussed with Dr. Malka Sonny Hock, MD Neurology  06/22/2024, 3:26 PM           [1] No Known Allergies

## 2024-06-22 NOTE — Progress Notes (Signed)
 Nutrition Follow-up  DOCUMENTATION CODES:   Not applicable  INTERVENTION:   TF via OGT:   Vital 1.5 @ 40 ml/hr  60 ml Prosource TF20 daily  200 ml free water  flush every 2 hours per MD  Tube feeding regimen provides 1520 kcal (100% of needs), 85 grams of protein, and 733 ml of H2O. Total free water : 3133 ml daily  -Continue 100 mg thiamine  daily x 7 days -Continue to monitor Mg, K, and Phos and replete as needed secondary to high refeeding risk   NUTRITION DIAGNOSIS:   Inadequate oral intake related to inability to eat (pt sedated and ventilated) as evidenced by NPO status.  Ongoing  GOAL:   Provide needs based on ASPEN/SCCM guidelines  Met with TF  MONITOR:   Vent status, Labs, Weight trends, TF tolerance, Skin, I & O's  REASON FOR ASSESSMENT:   Consult Enteral/tube feeding initiation and management  ASSESSMENT:   34 y.o. female with medical history significant for NICM, CHF, DVT, HTN, esophageal ulcer, PEA arrest, C. Diff, CKD III, type 1 diabetes mellitus (brittle), peripheral neuropathy, suspected EPI (not tested), anxiety, depression, hypertension, severe malnutrition, diabetic gastroparesis requiring J-tube (s/p open placement 06/13/22, dislodged 06/2023), GERD, autoimmune thyroid  disease, celiac disease (reported/neg on biopsy), anemia, autoimmune polyendocrine syndrome type 2, adrenal insufficiency, hyperemesis in pregnancy and shingles who is admitted with AMS and hypoglycemia requiring intubation for airway protection.  1/25- OGT placed, KUB reveals gastric tube in appropriate position  1/26- MRI of brain WDL 1/27- s/p lumbar puncture  Reviewed I/O's: +1.6 L x 24 hours and +10.7 L since admission  UOP: 375 ml x 24 hours  Rectal tube output: 250 ml x 24 hours  MAP: 100  Patient is currently intubated on ventilator support MV: 8.4 L/min Temp (24hrs), Avg:99.1 F (37.3 C), Min:80.8 F (27.1 C), Max:100 F (37.8 C)  Per MD notes, no acute events  overnight. She had a hypoglycemia event overnight. She remains encephalopathic despite being off pressors. Per MD notes, likely related to hypoglycemia. Mental status I precluding extubation.   ID following; currently on antibiotics to cover sepsis. Meningitis coverage d/c. CSF cultures are pending.   Patient continues to receive Vital 1.5 @ 50 ml/hr. Tolerating TF well.   Reviewed weights; weight have ranged from 55.2-63.3 kg since admission.   Medications reviewed and include solu-cortef , synthroid , protonix , potassium and sodium phosphates , miralax , senokot, and thiamine .   Labs reviewed: Na: 154, K: 3.,4 (on supplementation), CBGS: 53-196 (inpatient orders for glycemic control are 0-9 units insulin  aspart every 4 hours and 10 units insulin  glargine-yfgn daily). DM coordinator following and recommendations implemented; patient with history of difficult to control type 1 diabetes.   Diet Order:   Diet Order             Diet NPO time specified  Diet effective now                   EDUCATION NEEDS:   No education needs have been identified at this time  Skin:  Skin Assessment: Reviewed RN Assessment  Last BM:  06/22/24 (type 7-150 ml via rectal tube)  Height:   Ht Readings from Last 1 Encounters:  06/19/24 5' 0.98 (1.549 m)    Weight:   Wt Readings from Last 1 Encounters:  06/22/24 61.6 kg    Ideal Body Weight:  47.7 kg  BMI:  Body mass index is 25.67 kg/m.  Estimated Nutritional Needs:   Kcal:  1571  Protein:  80-95  grams  Fluid:  1.6-1.8 L    Margery ORN, RD, LDN, CDCES Registered Dietitian III Certified Diabetes Care and Education Specialist If unable to reach this RD, please use RD Inpatient group chat on secure chat between hours of 8am-4 pm daily

## 2024-06-22 NOTE — Plan of Care (Signed)
" °  Problem: Clinical Measurements: Goal: Ability to maintain clinical measurements within normal limits will improve Outcome: Progressing Goal: Will remain free from infection Outcome: Progressing Goal: Respiratory complications will improve Outcome: Progressing   Problem: Nutrition: Goal: Adequate nutrition will be maintained Outcome: Progressing   Problem: Coping: Goal: Level of anxiety will decrease Outcome: Progressing   Problem: Elimination: Goal: Will not experience complications related to bowel motility Outcome: Progressing Goal: Will not experience complications related to urinary retention Outcome: Progressing   Problem: Pain Managment: Goal: General experience of comfort will improve and/or be controlled Outcome: Progressing   Problem: Safety: Goal: Ability to remain free from injury will improve Outcome: Progressing   Problem: Skin Integrity: Goal: Risk for impaired skin integrity will decrease Outcome: Progressing   Problem: Nutritional: Goal: Maintenance of adequate nutrition will improve Outcome: Progressing   Problem: Skin Integrity: Goal: Risk for impaired skin integrity will decrease Outcome: Progressing   Problem: Tissue Perfusion: Goal: Adequacy of tissue perfusion will improve Outcome: Progressing   Problem: Cardiac: Goal: Ability to maintain an adequate cardiac output will improve Outcome: Progressing   "

## 2024-06-22 NOTE — Progress Notes (Signed)
 PHARMACY CONSULT NOTE  Pharmacy Consult for Electrolyte Monitoring and Replacement   Recent Labs: Potassium (mmol/L)  Date Value  06/22/2024 3.4 (L)  03/01/2013 4.1   Magnesium  (mg/dL)  Date Value  98/69/7973 2.0   Calcium  (mg/dL)  Date Value  98/69/7973 7.1 (L)   Calcium , Total (mg/dL)  Date Value  89/90/7985 9.1   Albumin  (g/dL)  Date Value  98/74/7973 3.5  12/21/2023 3.3 (L)  03/01/2013 3.9   Phosphorus (mg/dL)  Date Value  98/69/7973 1.9 (L)   Sodium (mmol/L)  Date Value  06/22/2024 154 (H)  12/21/2023 145 (H)  03/01/2013 133 (L)   Assessment: 34 y.o. female with medical history significant for NICM, CHF, DVT, HTN, esophageal ulcer, PEA arrest, C. Diff, CKD III, type 1 diabetes mellitus (brittle), peripheral neuropathy, suspected EPI (not tested), anxiety, depression, hypertension, severe malnutrition, diabetic gastroparesis requiring J-tube (s/p open placement 06/13/22, dislodged 06/2023), GERD, autoimmune thyroid  disease, celiac disease (reported/neg on biopsy), anemia, autoimmune polyendocrine syndrome type 2, adrenal insufficiency, hyperemesis in pregnancy and shingles who is admitted with AMS and hypoglycemia requiring intubation for airway protection. Pharmacy is asked to follow and replace electrolytes while in CCU  Goal of Therapy:  Electrolytes WNL  Plan:  --Na 154, free water  increased to 200 mL per tube q2h (2.4 L/day). Continues on tube feeds at 50 mL/hr (1.2 L/day) --K 3.4, Kcl 40 mEq per tube x 1 --Phos 1.9, Phos-Nak 2 packets per tube q4h x 4 doses --Follow-up electrolytes with AM labs tomorrow  Marolyn KATHEE Mare 06/22/2024 7:44 AM

## 2024-06-22 NOTE — Inpatient Diabetes Management (Signed)
 Inpatient Diabetes Program Recommendations  AACE/ADA: New Consensus Statement on Inpatient Glycemic Control (2015)  Target Ranges:  Prepandial:   less than 140 mg/dL      Peak postprandial:   less than 180 mg/dL (1-2 hours)      Critically ill patients:  140 - 180 mg/dL    Latest Reference Range & Units 06/20/24 23:25 06/21/24 03:44 06/21/24 07:22 06/21/24 11:46 06/21/24 15:06 06/21/24 19:08  Glucose-Capillary 70 - 99 mg/dL 886 (H) 801 (H)  3 units Novolog   213 (H)  5 units Novolog   10 units Lantus  @0959   196 (H)  3 units Novolog   135 (H)  2 units Novolog   124 (H)  2 units Novolog   10 units Semglee  @2105    (H): Data is abnormally high  Latest Reference Range & Units 06/21/24 23:21 06/22/24 03:09 06/22/24 07:29  Glucose-Capillary 70 - 99 mg/dL 894 (H) 78 53 (L)      History: Type 1 diabetes   Home DM Meds: OmniPod insulin  pump with Novolog , Dexcom G7 Off pump plan: Lantus  11 units daily, Novolog  1 unit per 20 grams of carbs plus correction of 1:70 mg/dl    Current Orders: Semglee  10 units BID                           Novolog  Moderate Correction Scale/ SSI (0-15 units) Q4H   MD- Note Solucortef reduced to 50 mg BID TF running 50cc/hr  Hypoglycemia this AM (CBG 53)  Please consider:  1. Reduce the Semglee  back to 10 units daily (currently ordered BID)  2. Reduce the Novolog  SSI to the Sensitive 0-9 unit scale Q4H  Pt very sensitive to insulin     --Will follow patient during hospitalization--  Adina Rudolpho Arrow RN, MSN, CDCES Diabetes Coordinator Inpatient Glycemic Control Team Team Pager: [phone] (8a-5p)

## 2024-06-22 NOTE — Plan of Care (Signed)
  Problem: Clinical Measurements: Goal: Ability to maintain clinical measurements within normal limits will improve Outcome: Progressing Goal: Will remain free from infection Outcome: Progressing Goal: Diagnostic test results will improve Outcome: Progressing Goal: Respiratory complications will improve Outcome: Progressing Goal: Cardiovascular complication will be avoided Outcome: Progressing   Problem: Nutrition: Goal: Adequate nutrition will be maintained Outcome: Progressing   Problem: Pain Managment: Goal: General experience of comfort will improve and/or be controlled Outcome: Progressing

## 2024-06-23 ENCOUNTER — Inpatient Hospital Stay

## 2024-06-23 ENCOUNTER — Other Ambulatory Visit: Payer: Self-pay

## 2024-06-23 DIAGNOSIS — E1122 Type 2 diabetes mellitus with diabetic chronic kidney disease: Secondary | ICD-10-CM | POA: Diagnosis not present

## 2024-06-23 DIAGNOSIS — R0902 Hypoxemia: Secondary | ICD-10-CM | POA: Diagnosis not present

## 2024-06-23 DIAGNOSIS — R569 Unspecified convulsions: Secondary | ICD-10-CM | POA: Diagnosis not present

## 2024-06-23 DIAGNOSIS — I130 Hypertensive heart and chronic kidney disease with heart failure and stage 1 through stage 4 chronic kidney disease, or unspecified chronic kidney disease: Secondary | ICD-10-CM | POA: Diagnosis not present

## 2024-06-23 DIAGNOSIS — R4182 Altered mental status, unspecified: Secondary | ICD-10-CM | POA: Diagnosis not present

## 2024-06-23 LAB — CBC
HCT: 27.5 % — ABNORMAL LOW (ref 36.0–46.0)
Hemoglobin: 8.7 g/dL — ABNORMAL LOW (ref 12.0–15.0)
MCH: 30.1 pg (ref 26.0–34.0)
MCHC: 31.6 g/dL (ref 30.0–36.0)
MCV: 95.2 fL (ref 80.0–100.0)
Platelets: 107 10*3/uL — ABNORMAL LOW (ref 150–400)
RBC: 2.89 MIL/uL — ABNORMAL LOW (ref 3.87–5.11)
RDW: 13.4 % (ref 11.5–15.5)
WBC: 9.9 10*3/uL (ref 4.0–10.5)
nRBC: 0 % (ref 0.0–0.2)

## 2024-06-23 LAB — GLUCOSE, CAPILLARY
Glucose-Capillary: 106 mg/dL — ABNORMAL HIGH (ref 70–99)
Glucose-Capillary: 140 mg/dL — ABNORMAL HIGH (ref 70–99)
Glucose-Capillary: 155 mg/dL — ABNORMAL HIGH (ref 70–99)
Glucose-Capillary: 159 mg/dL — ABNORMAL HIGH (ref 70–99)
Glucose-Capillary: 165 mg/dL — ABNORMAL HIGH (ref 70–99)
Glucose-Capillary: 248 mg/dL — ABNORMAL HIGH (ref 70–99)
Glucose-Capillary: 374 mg/dL — ABNORMAL HIGH (ref 70–99)
Glucose-Capillary: 46 mg/dL — ABNORMAL LOW (ref 70–99)

## 2024-06-23 LAB — BASIC METABOLIC PANEL WITH GFR
Anion gap: 10 (ref 5–15)
BUN: 27 mg/dL — ABNORMAL HIGH (ref 6–20)
CO2: 21 mmol/L — ABNORMAL LOW (ref 22–32)
Calcium: 7.7 mg/dL — ABNORMAL LOW (ref 8.9–10.3)
Chloride: 115 mmol/L — ABNORMAL HIGH (ref 98–111)
Creatinine, Ser: 1.53 mg/dL — ABNORMAL HIGH (ref 0.44–1.00)
GFR, Estimated: 46 mL/min — ABNORMAL LOW
Glucose, Bld: 168 mg/dL — ABNORMAL HIGH (ref 70–99)
Potassium: 4.9 mmol/L (ref 3.5–5.1)
Sodium: 145 mmol/L (ref 135–145)

## 2024-06-23 LAB — MAGNESIUM: Magnesium: 1.8 mg/dL (ref 1.7–2.4)

## 2024-06-23 LAB — PHOSPHORUS: Phosphorus: 3.7 mg/dL (ref 2.5–4.6)

## 2024-06-23 MED ORDER — MIDAZOLAM HCL 2 MG/2ML IJ SOLN
INTRAMUSCULAR | Status: AC
  Administered 2024-06-23: 2 mg via INTRAVENOUS
  Filled 2024-06-23: qty 2

## 2024-06-23 MED ORDER — FREE WATER
100.0000 mL | Status: DC
  Administered 2024-06-23 – 2024-06-24 (×5): 100 mL

## 2024-06-23 MED ORDER — MIDAZOLAM HCL (PF) 2 MG/2ML IJ SOLN: 1.0000 mg | Freq: Once | INTRAMUSCULAR | Status: AC

## 2024-06-23 MED ORDER — MAGNESIUM SULFATE 2 GM/50ML IV SOLN
2.0000 g | Freq: Once | INTRAVENOUS | Status: AC
  Administered 2024-06-23: 2 g via INTRAVENOUS
  Filled 2024-06-23: qty 50

## 2024-06-23 MED ORDER — FENTANYL CITRATE (PF) 50 MCG/ML IJ SOSY
50.0000 ug | PREFILLED_SYRINGE | INTRAMUSCULAR | Status: DC | PRN
  Administered 2024-06-23: 50 ug via INTRAVENOUS

## 2024-06-23 MED ORDER — SODIUM CHLORIDE 0.9 % IV SOLN: INTRAVENOUS | Status: AC | PRN

## 2024-06-23 MED ORDER — MIDAZOLAM HCL 2 MG/2ML IJ SOLN
INTRAMUSCULAR | Status: AC
  Filled 2024-06-23: qty 2

## 2024-06-23 MED ORDER — LIDOCAINE HCL 1 % IJ SOLN
INTRAMUSCULAR | Status: AC
  Filled 2024-06-23: qty 10

## 2024-06-23 MED ORDER — INSULIN ASPART 100 UNIT/ML IJ SOLN
0.0000 [IU] | INTRAMUSCULAR | Status: DC
  Administered 2024-06-23: 2 [IU] via SUBCUTANEOUS
  Administered 2024-06-23: 9 [IU] via SUBCUTANEOUS
  Administered 2024-06-24: 3 [IU] via SUBCUTANEOUS
  Filled 2024-06-23: qty 9
  Filled 2024-06-23: qty 3
  Filled 2024-06-23: qty 2

## 2024-06-23 MED ORDER — PROPOFOL 1000 MG/100ML IV EMUL
0.0000 ug/kg/min | INTRAVENOUS | Status: DC
  Administered 2024-06-23: 10 ug/kg/min via INTRAVENOUS
  Filled 2024-06-23: qty 100

## 2024-06-23 NOTE — Progress Notes (Signed)
 Attempted to call mother for PICC consent.  No answer.  Will attempt again later this morning.

## 2024-06-23 NOTE — Progress Notes (Signed)
 "  NAME:  Ashley Camacho, MRN:  [id number], DOB:  1990-09-28, LOS: 6 ADMISSION DATE:  06/17/2024, CONSULTATION DATE: 06/17/2024 REFERRING MD: Dr. Floy, CHIEF COMPLAINT: Unresponsiveness    History of Present Illness:  34 y/o female with a medical history as indicated below whop presented to the ED via EMS after being found unresponsive. History is obtained from ED records, EMS records and from speaking with patient's mother. Per EMS records, patient was found unresponsive by her friend. Total downtime is uncertain. Blood glucose with EMS was 27 mg/dl. She was given an amp of glucagon  and blood glucose improved to 44 mg/dl.   Upon arrival in the ED, patient was still unresponsive, hypothermic, hypoxic on 3L [place] and pupils were dilated. An I/O was placed in her right leg. She given an amp of D50. Glucose improved to 252 mg/dl. Despite increase in blood glucose, she remained unresponsive and hypoxic hence she was emergently intubated. PCCM consulted for further management.  ED work-up: CT head negative. Chest x-ray shows left lower lobe atelectasis. Labs pending   Of note, patient was diagnosed with influenza A on 05/27/24 and was hospitalized from 1/4-05/30/2024. Per patient's mother, she is a very fragile diabetic. She states that she has been buying groceries for patient because she is not able to afford food. Patient lives with friends. Unfortunately she does not have the contact information for patient's friend who called EMS or the friends with whom she live  Pertinent  Medical History   Past Medical History:  Diagnosis Date   Acute metabolic encephalopathy 06/10/2022   Adrenal insufficiency    Anemia    Anxiety    Blood transfusion without reported diagnosis    CHF (congestive heart failure) (HCC)    Chronic kidney disease    Depression    Gastroparesis    Hypertension    Hypotension    Intractable nausea and vomiting 11/13/2022   Migraine    Thyroid  disease    Type 1 diabetes (HCC)     Micro Data:  01/25: MRSA PCR>>negative 01/25: Urine>>klebsiella pneumoniae  01/25: Blood x2>>NGTD 01/25: RVP>>negative  01/25: Tracheal aspirate>> 01/26: COVID>>negative  01/27: Fungus>>no fungus isolated after 3 days  01/27: CSF w gram stain>>negative  01/27: CSF anaerobic w gram stain>>negative   Anti-infectives (From admission, onward)    Start     Dose/Rate Route Frequency Ordered Stop   06/22/24 1000  cefTRIAXone  (ROCEPHIN ) 2 g in sodium chloride  0.9 % 100 mL IVPB  Status:  Discontinued        2 g 200 mL/hr over 30 Minutes Intravenous Every 24 hours 06/21/24 1029 06/21/24 1030   06/22/24 1000  cefTRIAXone  (ROCEPHIN ) 2 g in sodium chloride  0.9 % 100 mL IVPB        2 g 200 mL/hr over 30 Minutes Intravenous Every 24 hours 06/21/24 1030 06/22/24 1900   06/19/24 1000  cefTRIAXone  (ROCEPHIN ) 2 g in sodium chloride  0.9 % 100 mL IVPB  Status:  Discontinued        2 g 200 mL/hr over 30 Minutes Intravenous Every 12 hours 06/19/24 0008 06/21/24 1029   06/19/24 0130  acyclovir  (ZOVIRAX ) 550 mg in dextrose  5 % 100 mL IVPB  Status:  Discontinued        10 mg/kg  55.2 kg 111 mL/hr over 60 Minutes Intravenous Every 12 hours 06/19/24 0039 06/20/24 0949   06/19/24 0130  vancomycin  (VANCOREADY) IVPB 750 mg/150 mL  Status:  Discontinued        750  mg 150 mL/hr over 60 Minutes Intravenous Every 24 hours 06/19/24 0042 06/21/24 1029   06/19/24 0115  cefTRIAXone  (ROCEPHIN ) 1 g in sodium chloride  0.9 % 100 mL IVPB        1 g 200 mL/hr over 30 Minutes Intravenous  Once 06/19/24 0018 06/19/24 0127   06/17/24 1700  cefTRIAXone  (ROCEPHIN ) 1 g in sodium chloride  0.9 % 100 mL IVPB  Status:  Discontinued        1 g 200 mL/hr over 30 Minutes Intravenous Every 24 hours 06/17/24 1558 06/19/24 0008   06/17/24 1600  azithromycin  (ZITHROMAX ) tablet 500 mg        500 mg Per Tube Daily 06/17/24 1558 06/22/24 0931   06/17/24 1600  vancomycin  (VANCOCIN ) IVPB 1000 mg/200 mL premix  Status:  Discontinued         1,000 mg 200 mL/hr over 60 Minutes Intravenous  Once 06/17/24 1558 06/17/24 1952      Significant Hospital Events: Including procedures, antibiotic start and stop dates in addition to other pertinent events   01/25: Admitted with severe hypoglycemia and acute hypoxic respiratory failure  01/26: Pt now with DKA requiring insulin  gtt remains mechanically intubated on minimal vent settings.  Mentation precluding extubation at this time  01/27: Pt transitioned off insulin  gtt 01/26.  MRI Brain negative due to continued acute encephalopathy due to concerns of possible meningitis continuing cetriaxone and added vancomycin /acyclovir  and pt pending LP today.  Pts hgb 6.3 with no signs of active bleeding receiving 1 unit pRBC's 1/28: No acute events overnight, remains encephalopathic which is precluding extubation.  Hemodynamically stable, no vasopressors. LP workup inconclusive, consult ID.  Decreased Solu-cortef  given hypertension. 1/29: Mentation precluding extubation. Discontinue vancomycin  per ID recommendation. Deescalated steroids due to HTN.  Neurology consulted due to continued acute encephalopathy recommended Cerebell for 24 hrs and addressing metabolic issues  8/69: No acute events overnight. Hypoglycemia to 50s, decreased Lantus  to daily 1/31: Pt remains mechanically intubated on minimal vent settings, however mentation precludes extubation.  Pt currently on Cerebell   Interim History / Subjective:  As outlined above under significant events   Objective    Blood pressure 131/81, pulse 81, temperature 99.4 F (37.4 C), temperature source Axillary, resp. rate 18, height 5' 0.98 (1.549 m), weight 64.4 kg, SpO2 100%.    Vent Mode: PRVC FiO2 (%):  [24 %] 24 % Set Rate:  [18 bmp] 18 bmp Vt Set:  [400 mL] 400 mL PEEP:  [5 cmH20] 5 cmH20   Intake/Output Summary (Last 24 hours) at 06/23/2024 0843 Last data filed at 06/23/2024 0800 Gross per 24 hour  Intake 2398.05 ml  Output 1610 ml  Net  788.05 ml   Filed Weights   06/21/24 0440 06/22/24 0500 06/23/24 0425  Weight: 62.3 kg 61.6 kg 64.4 kg   Examination: General: Acutely-ill appearing female sedated not following commands, NAD mechanically intubated  HENT: Supple, no JVD  Lungs: Clear throughout, even, non labored  Cardiovascular: Sinus rhythm, s1s2, no m/r/g, 1+ radial/1+ distal pulses, trace generalized edema  Abdomen: +BS x4, soft, non distended  Extremities: Normal bulk and tone  Neuro: Sedated, moving all extremities spontaneously but unable to follow commands, bilateral pupils 2 mm sluggish  GU: External catheter draining yellow urine   Resolved problem list   Assessment and Plan   #Acute toxic metabolic encephalopathy  #Sedation needs due to mechanical intubation pain/discomfort  #Suspect neuroglycopenia  Hx: Anxiety and depression  Urine drug screen: negative  CT Head: No acute  intracranial abnormality. Left maxillary and bilateral ethmoid sinus mucosal thickening with layering secretions, which can be seen with acute sinusitis. EEG 01/26: negative for seizures or definite epileptiform discharges  - Correct metabolic derangements  - MRI Brain negative for acute abnormality - LP performed, cell count with neutrophil predominant pleocytosis. Meningitis/Encephalitis panel was negative - Maintain normoglycemia  - Avoid sedating medications as able  - Maintain RASS goal of 0 to -1 - PAD protocol to maintain RASS goal: prn fentanyl   - WUA daily   #Hypotension~resolved   #HTN #Mildly elevated troponin likely secondary to demand ischemia  Hx: Chronic diastolic CHF and hypotension  - Continuous telemetry monitoring  - Scheduled iv labetalol  and prn hydralazine  for bp control - Lactic acid normal  - Continue outpatient rosuvastatin    #Mechanical intubation for airway protection  - Full vent support for now: vent settings reviewed and established - Continue lung protective strategies  - Maintain plateau  pressures less than 30 cm H2O - VAP bundle implemented  - SBT once mentation and respiratory status permits  - Intermittent CXR and ABG's  - Prn bronchodilator therapy   #Acute kidney injury secondary to ATN  #Hypomagnesia  #Hyperkalemia~resolved   #Anion gap metabolic acidosis~resolved  - Trend BMP - Replace electrolytes as indicated  - Strict I&O's - Avoid nephrotoxic agents as able  #Klebsiella pneumoniae UTI  - Trend WBC and monitor fever curve  - Follow cultures  - Meningitis/Encephalitis panel negative  - Completed course of abx   #Anemia with no signs of bleeding~improving  #Thrombocytopenia  - Trend CBC  - Monitor for s/sx of bleeding  - Transfuse for hgb <7 - Subcutaneous heparin  for VTE px   #Adrenal insufficiency  - Continue solucortef 50 mg q12h  #Hypothyroidism  - Continue synthroid    #Gastroparesis  - PPI   #Hypoglycemia #DKA~resolved   - SSI discontinued due to hypoglycemia  - Continue D10W @50  ml/hr  - Target CBG readings 140 to 180 - Follow hyper/hypoglycemic protocol  - Diabetes coordinator consulted appreciate input   Attempted to update pts mother Ashley Camacho via telephone, however she did not answer the phone.  Left HIPAA appropriate voicemail instructing her to return my phone call  Labs   CBC: Recent Labs  Lab 06/17/24 1600 06/18/24 0440 06/19/24 1152 06/19/24 1830 06/20/24 0315 06/21/24 0432 06/22/24 0302 06/23/24 0358  WBC 5.8   < > 14.0*  --  9.9 6.9 7.8 9.9  NEUTROABS 4.8  --   --   --   --   --   --   --   HGB 8.4*   < > 8.4* 8.2* 8.0* 8.9* 8.2* 8.7*  HCT 25.4*   < > 24.3* 24.3* 23.5* 26.5* 25.9* 27.5*  MCV 91.4   < > 88.0  --  88.3 90.4 95.6 95.2  PLT 84*   < > 118*  --  117* 105* 119* 107*   < > = values in this interval not displayed.    Basic Metabolic Panel: Recent Labs  Lab 06/19/24 0350 06/19/24 1152 06/20/24 0315 06/21/24 0432 06/21/24 1814 06/22/24 0302 06/23/24 0358  NA 138  --  141 149* 154* 154* 145   K 4.4  --  3.5 2.5* 3.9 3.4* 4.9  CL 106  --  110 116* 125* 127* 115*  CO2 20*  --  21* 20* 20* 19* 21*  GLUCOSE 229*  --  265* 201* 122* 83 168*  BUN 37*  --  33* 33* 34* 32*  27*  CREATININE 1.95*  --  1.88* 1.76* 1.75* 1.68* 1.53*  CALCIUM  7.6*  --  7.1* 7.5* 7.5* 7.1* 7.7*  MG 1.6* 2.5* 2.2 2.2  --  2.0 1.8  PHOS 2.8  --  2.6 3.1  --  1.9* 3.7   GFR: Estimated Creatinine Clearance: 44.9 mL/min (A) (by C-G formula based on SCr of 1.53 mg/dL (H)). Recent Labs  Lab 06/17/24 1600 06/17/24 1820 06/18/24 0440 06/19/24 0350 06/20/24 0315 06/21/24 0432 06/22/24 0302 06/23/24 0358  WBC 5.8  --  28.1*   < > 9.9 6.9 7.8 9.9  LATICACIDVEN 2.1* 1.9 1.4  --   --   --   --   --    < > = values in this interval not displayed.    Liver Function Tests: Recent Labs  Lab 06/17/24 1600 06/22/24 1537  AST 32 23  ALT 11 14  ALKPHOS 59 56  BILITOT 0.3 <0.2  PROT 5.9* 4.9*  ALBUMIN  3.5 2.8*   No results for input(s): LIPASE, AMYLASE in the last 168 hours. No results for input(s): AMMONIA in the last 168 hours.  ABG    Component Value Date/Time   PHART 7.21 (L) 06/18/2024 0409   PCO2ART 27 (L) 06/18/2024 0409   PO2ART 153 (H) 06/18/2024 0409   HCO3 16.9 (L) 06/18/2024 1100   ACIDBASEDEF 8.3 (H) 06/18/2024 1100   O2SAT 91.8 06/18/2024 1100     Coagulation Profile: No results for input(s): INR, PROTIME in the last 168 hours.  Cardiac Enzymes: No results for input(s): CKTOTAL, CKMB, CKMBINDEX, TROPONINI in the last 168 hours.  HbA1C: Hgb A1c MFr Bld  Date/Time Value Ref Range Status  05/27/2024 11:28 PM 7.1 (H) 4.8 - 5.6 % Final    Comment:    (NOTE) Diagnosis of Diabetes The following HbA1c ranges recommended by the American Diabetes Association (ADA) may be used as an aid in the diagnosis of diabetes mellitus.  Hemoglobin             Suggested A1C NGSP%              Diagnosis  <5.7                   Non Diabetic  5.7-6.4                 Pre-Diabetic  >6.4                   Diabetic  <7.0                   Glycemic control for                       adults with diabetes.    10/09/2023 08:34 PM 8.0 (H) 4.8 - 5.6 % Final    Comment:    (NOTE) Pre diabetes:          5.7%-6.4%  Diabetes:              >6.4%  Glycemic control for   <7.0% adults with diabetes     CBG: Recent Labs  Lab 06/22/24 2352 06/23/24 0013 06/23/24 0318 06/23/24 0355 06/23/24 0727  GLUCAP 29* 106* 46* 155* 159*    Review of Systems:   Unable to assess pt mechanically intubated   Past Medical History:  She,  has a past medical history of Acute metabolic encephalopathy (06/10/2022), Adrenal insufficiency, Anemia, Anxiety, Blood transfusion without reported diagnosis, CHF (  congestive heart failure) (HCC), Chronic kidney disease, Depression, Gastroparesis, Hypertension, Hypotension, Intractable nausea and vomiting (11/13/2022), Migraine, Thyroid  disease, and Type 1 diabetes (HCC).   Surgical History:   Past Surgical History:  Procedure Laterality Date   BIOPSY  01/14/2021   Procedure: BIOPSY;  Surgeon: Eda Iha, MD;  Location: [facility] ENDOSCOPY;  Service: Gastroenterology;;   CENTRAL LINE INSERTION N/A 10/10/2023   Procedure: CENTRAL LINE INSERTION;  Surgeon: Marea Selinda RAMAN, MD;  Location: [facility] INVASIVE CV LAB;  Service: Cardiovascular;  Laterality: N/A;   CESAREAN SECTION     x2   CESAREAN SECTION WITH BILATERAL TUBAL LIGATION N/A 02/17/2021   Procedure: CESAREAN SECTION WITH BILATERAL TUBAL LIGATION;  Surgeon: Barbra Lang PARAS, DO;  Location: [facility] LD ORS;  Service: Obstetrics;  Laterality: N/A;   ESOPHAGOGASTRODUODENOSCOPY (EGD) WITH PROPOFOL  N/A 01/14/2021   Procedure: ESOPHAGOGASTRODUODENOSCOPY (EGD) WITH PROPOFOL ;  Surgeon: Eda Iha, MD;  Location: [facility] ENDOSCOPY;  Service: Gastroenterology;  Laterality: N/A;   IR BONE MARROW BIOPSY & ASPIRATION  12/16/2022   IR REPLC DUODEN/JEJUNO TUBE PERCUT W/FLUORO  11/22/2022    JEJUNOSTOMY N/A 06/13/2022   Procedure: DEXTER;  Surgeon: Jordis Laneta FALCON, MD;  Location: [facility] ORS;  Service: General;  Laterality: N/A;   MOUTH SURGERY     TEE WITHOUT CARDIOVERSION N/A 01/06/2024   Procedure: ECHOCARDIOGRAM, TRANSESOPHAGEAL;  Surgeon: Rolan Ezra RAMAN, MD;  Location: [facility] ORS;  Service: Cardiovascular;  Laterality: N/A;   TUBAL LIGATION       Social History:   reports that she has never smoked. She has never been exposed to tobacco smoke. She has never used smokeless tobacco. She reports that she does not drink alcohol and does not use drugs.   Family History:  Her family history includes ADD / ADHD in her daughter; Breast cancer in her mother; CAD in her father, paternal grandfather, and paternal grandmother; Cancer in her mother; Diabetes in her father, maternal uncle, paternal grandfather, and paternal grandmother; Diabetes type I in her father; Early death in her father; Lung cancer in her maternal grandfather; Seizures in her mother. There is no history of Ovarian cancer.   Allergies Allergies[1]   Home Medications  Prior to Admission medications  Medication Sig Start Date End Date Taking? Authorizing Provider  acetaminophen  (TYLENOL ) 500 MG tablet Take 2 tablets (1,000 mg total) by mouth every 6 (six) hours as needed for headache, fever or moderate pain. 08/05/22  Yes Dorinda Drue DASEN, MD  hydrocortisone  (CORTEF ) 10 MG tablet Take 2 tablets (20 mg total) by mouth daily with breakfast AND 1 tablet (10 mg total) daily with supper. 05/27/23  Yes Sreenath, Sudheer B, MD  insulin  aspart (NOVOLOG ) 100 UNIT/ML FlexPen Inject 2 Units into the skin 3 (three) times daily with meals. If eating and Blood Glucose (BG) 80 or higher inject 2 units for meal coverage and add correction dose per scale. If not eating, correction dose only. BG <150= 0 unit; BG 150-200= 1 unit; BG 201-250= 3 unit; BG 251-300= 5 unit; BG 301-350= 7 unit; BG 351-400= 9 unit; BG >400= 11 unit and Call Primary  Care. 10/12/23  Yes [name], MD  Accu-Chek Softclix Lancets lancets Use to check blood sugar up to 6 times daily. May substitute to any manufacturer covered by patient's insurance. 01/19/24   Ostwalt, [name], PA-C  BD PEN NEEDLE MINI ULTRAFINE 31G X 5 MM MISC by Other route. 07/18/23 07/17/24  [provider]  Blood Glucose Monitoring Suppl (BLOOD GLUCOSE MONITOR SYSTEM) w/Device KIT Use to  check blood sugar up to 6 times daily. May substitute to any manufacturer covered by patient's insurance. 01/19/24   Ostwalt, [name], PA-C  Continuous Glucose Sensor (DEXCOM G7 SENSOR) MISC Use to check blood sugar. Change every 10 days 01/19/24   Ostwalt, [name], PA-C  furosemide  (LASIX ) 20 MG tablet Take 1 tablet (20 mg total) by mouth daily as needed. 02/17/24 06/13/24  [place] A, FNP  Glucose Blood (BLOOD GLUCOSE TEST STRIPS) STRP Use to check blood sugar up to 6 times daily. May substitute to any manufacturer covered by patient's insurance. 01/19/24   Ostwalt, [name], PA-C  guaiFENesin  (MUCINEX ) 600 MG 12 hr tablet Take 1 tablet (600 mg total) by mouth 2 (two) times daily. Patient not taking: No sig reported 05/30/24   Arlon Carliss ORN, DO  Insulin  Pen Needle (PEN NEEDLES) 31G X 6 MM MISC PEN NEEDLES 31G X 6 MM 05/31/17   [provider]  Lancet Device MISC Use to check blood sugar up to 6 times daily. May substitute to any manufacturer covered by patient's insurance. 01/19/24   Ostwalt, [name], PA-C  levothyroxine  (SYNTHROID ) 75 MCG tablet TAKE 1 TABLET (75 MCG TOTAL) BY MOUTH DAILY AT 6 (SIX) AM. 12/06/23   Ostwalt, [name], PA-C  metoprolol  succinate (TOPROL -XL) 25 MG 24 hr tablet TAKE 1 TABLET (25 MG TOTAL) BY MOUTH DAILY. Patient not taking: Reported on 06/17/2024 03/23/24   Rolan Ezra RAMAN, MD  nortriptyline (PAMELOR) 25 MG capsule Take 25 mg by mouth at bedtime.    [provider]  NOVOLOG  100 UNIT/ML injection Inject 0-50 Units into the skin as needed for high blood sugar. FOR USE  IN INSULIN  PUMP. MAX 50 UNITS PER DAY. 04/14/24   [provider]  oseltamivir  (TAMIFLU ) 30 MG capsule Take 1 capsule (30 mg total) by mouth daily. Patient not taking: No sig reported 05/31/24   Arlon Carliss ORN, DO  rosuvastatin  (CRESTOR ) 10 MG tablet Take 1 tablet (10 mg total) by mouth daily. 01/18/24   [place] LABOR, FNP  sacubitril -valsartan  (ENTRESTO ) 24-26 MG Take 1 tablet by mouth 2 (two) times daily. 02/28/24   Rolan Ezra RAMAN, MD  sodium zirconium cyclosilicate  (LOKELMA ) 10 g PACK packet Take 10 g by mouth daily. 02/28/24   Rolan Ezra RAMAN, MD      Critical care time: 35 minutes      Lonell Moose, [facility]  Pulmonary/Critical Care Pager [phone] (please enter 7 digits) PCCM Consult Pager [phone] (please enter 7 digits)          [1] No Known Allergies  "

## 2024-06-23 NOTE — Plan of Care (Signed)
" °  Problem: Education: Goal: Knowledge of General Education information will improve Description: Including pain rating scale, medication(s)/side effects and non-pharmacologic comfort measures Outcome: Progressing   Problem: Health Behavior/Discharge Planning: Goal: Ability to manage health-related needs will improve Outcome: Progressing   Problem: Clinical Measurements: Goal: Ability to maintain clinical measurements within normal limits will improve Outcome: Progressing Goal: Will remain free from infection Outcome: Progressing Goal: Diagnostic test results will improve Outcome: Progressing Goal: Respiratory complications will improve Outcome: Progressing Goal: Cardiovascular complication will be avoided Outcome: Progressing   Problem: Activity: Goal: Risk for activity intolerance will decrease Outcome: Progressing   Problem: Nutrition: Goal: Adequate nutrition will be maintained Outcome: Progressing   Problem: Coping: Goal: Level of anxiety will decrease Outcome: Progressing   Problem: Elimination: Goal: Will not experience complications related to bowel motility Outcome: Progressing Goal: Will not experience complications related to urinary retention Outcome: Progressing   Problem: Pain Managment: Goal: General experience of comfort will improve and/or be controlled Outcome: Progressing   Problem: Safety: Goal: Ability to remain free from injury will improve Outcome: Progressing   Problem: Skin Integrity: Goal: Risk for impaired skin integrity will decrease Outcome: Progressing   Problem: Education: Goal: Ability to describe self-care measures that may prevent or decrease complications (Diabetes Survival Skills Education) will improve Outcome: Progressing Goal: Individualized Educational Video(s) Outcome: Progressing   Problem: Coping: Goal: Ability to adjust to condition or change in health will improve Outcome: Progressing   Problem: Fluid  Volume: Goal: Ability to maintain a balanced intake and output will improve Outcome: Progressing   Problem: Health Behavior/Discharge Planning: Goal: Ability to identify and utilize available resources and services will improve Outcome: Progressing Goal: Ability to manage health-related needs will improve Outcome: Progressing   Problem: Metabolic: Goal: Ability to maintain appropriate glucose levels will improve Outcome: Progressing   Problem: Nutritional: Goal: Maintenance of adequate nutrition will improve Outcome: Progressing Goal: Progress toward achieving an optimal weight will improve Outcome: Progressing   Problem: Skin Integrity: Goal: Risk for impaired skin integrity will decrease Outcome: Progressing   Problem: Tissue Perfusion: Goal: Adequacy of tissue perfusion will improve Outcome: Progressing   Problem: Education: Goal: Ability to describe self-care measures that may prevent or decrease complications (Diabetes Survival Skills Education) will improve Outcome: Progressing Goal: Individualized Educational Video(s) Outcome: Progressing   "

## 2024-06-23 NOTE — Progress Notes (Addendum)
 Assessed BUE for PICC placement.  All veins measure between 52% and greater than 110% occupancy with tourniquet on. Noted a lot of collateral circulation BUE.  Veins also not clearly defined when assessed with ultrasound, tourniquet required for visualization. BUE also contracted and difficult to abduct.  FYI added to the chart for IR to place PICC lines. Dr Malka on unit notified of assessment and no veins suitable for PICC or midline.  Referred to  IR for line placement.

## 2024-06-23 NOTE — Inpatient Diabetes Management (Signed)
 Inpatient Diabetes Program Recommendations  AACE/ADA: New Consensus Statement on Inpatient Glycemic Control (2015)  Target Ranges:  Prepandial:   less than 140 mg/dL      Peak postprandial:   less than 180 mg/dL (1-2 hours)      Critically ill patients:  140 - 180 mg/dL   Lab Results  Component Value Date   GLUCAP 159 (H) 06/23/2024   HGBA1C 7.1 (H) 05/27/2024    Review of Glycemic Control  History: Type 1 diabetes   Home DM Meds: OmniPod insulin  pump with Novolog , Dexcom G7 Off pump plan: Lantus  11 units daily, Novolog  1 unit per 20 grams of carbs plus correction of 1:70 mg/dl    Current Orders: Semglee  10 units QD                           Novolog  Moderate Correction Scale/ SSI (0-9 units) Q4H     MD- Note Solucortef reduced to 50 mg BID TF running 50cc/hr   Hypoglycemia this AM (CBG 53)   Consider:   1. Add Semglee  4 units QD   2. Reduce the Novolog  SSI to the Sensitive 0-6 unit scale Q6H   Pt very sensitive to insulin    Thanks, Tinnie Minus, MSN, RNC-OB Diabetes Coordinator [phone] (8a-5p)

## 2024-06-23 NOTE — Progress Notes (Signed)
 Pts right internal jugular central line has been in place greater than 5 days.  To avoid increased risk of infection pt needs PICC line placement.  Multiple attempts have been made to contact pts mother Erion Hermans to obtain consent via telephone, however unsuccessful.  Therefore, will need to place PICC line emergently.    Lonell Moose, AGNP  Pulmonary/Critical Care Pager [phone] (please enter 7 digits) PCCM Consult Pager [phone] (please enter 7 digits)

## 2024-06-23 NOTE — Progress Notes (Signed)
 PHARMACY CONSULT NOTE  Pharmacy Consult for Electrolyte Monitoring and Replacement   Recent Labs: Potassium (mmol/L)  Date Value  06/23/2024 4.9  03/01/2013 4.1   Magnesium  (mg/dL)  Date Value  98/68/7973 1.8   Calcium  (mg/dL)  Date Value  98/68/7973 7.7 (L)   Calcium , Total (mg/dL)  Date Value  89/90/7985 9.1   Albumin  (g/dL)  Date Value  98/69/7973 2.8 (L)  12/21/2023 3.3 (L)  03/01/2013 3.9   Phosphorus (mg/dL)  Date Value  98/68/7973 3.7   Sodium (mmol/L)  Date Value  06/23/2024 145  12/21/2023 145 (H)  03/01/2013 133 (L)   Assessment: 34 y.o. female with medical history significant for NICM, CHF, DVT, HTN, esophageal ulcer, PEA arrest, C. Diff, CKD III, type 1 diabetes mellitus (brittle), peripheral neuropathy, suspected EPI (not tested), anxiety, depression, hypertension, severe malnutrition, diabetic gastroparesis requiring J-tube (s/p open placement 06/13/22, dislodged 06/2023), GERD, autoimmune thyroid  disease, celiac disease (reported/neg on biopsy), anemia, autoimmune polyendocrine syndrome type 2, adrenal insufficiency, hyperemesis in pregnancy and shingles who is admitted with AMS and hypoglycemia requiring intubation for airway protection. Pharmacy is asked to follow and replace electrolytes while in CCU  Nutrition: Vital AF at 40 mL/hr + free water  flushes at 200 mL every 4 hours  Goal of Therapy:  Electrolytes WNL  Plan:  --2 grams IV magnesium  sulfate x 1 --reduce  free water  flushes to 100 mL every 4 hours --Follow-up electrolytes with AM labs tomorrow  Ashley Camacho 06/23/2024 7:09 AM

## 2024-06-24 DIAGNOSIS — I130 Hypertensive heart and chronic kidney disease with heart failure and stage 1 through stage 4 chronic kidney disease, or unspecified chronic kidney disease: Secondary | ICD-10-CM | POA: Diagnosis not present

## 2024-06-24 DIAGNOSIS — R4182 Altered mental status, unspecified: Secondary | ICD-10-CM | POA: Diagnosis not present

## 2024-06-24 DIAGNOSIS — E1122 Type 2 diabetes mellitus with diabetic chronic kidney disease: Secondary | ICD-10-CM | POA: Diagnosis not present

## 2024-06-24 DIAGNOSIS — R0902 Hypoxemia: Secondary | ICD-10-CM | POA: Diagnosis not present

## 2024-06-24 LAB — CBC
HCT: 27.1 % — ABNORMAL LOW (ref 36.0–46.0)
Hemoglobin: 8.7 g/dL — ABNORMAL LOW (ref 12.0–15.0)
MCH: 29.8 pg (ref 26.0–34.0)
MCHC: 32.1 g/dL (ref 30.0–36.0)
MCV: 92.8 fL (ref 80.0–100.0)
Platelets: 136 10*3/uL — ABNORMAL LOW (ref 150–400)
RBC: 2.92 MIL/uL — ABNORMAL LOW (ref 3.87–5.11)
RDW: 12.5 % (ref 11.5–15.5)
WBC: 11.4 10*3/uL — ABNORMAL HIGH (ref 4.0–10.5)
nRBC: 0 % (ref 0.0–0.2)

## 2024-06-24 LAB — ANAEROBIC CULTURE W GRAM STAIN

## 2024-06-24 LAB — GLUCOSE, CAPILLARY
Glucose-Capillary: 115 mg/dL — ABNORMAL HIGH (ref 70–99)
Glucose-Capillary: 121 mg/dL — ABNORMAL HIGH (ref 70–99)
Glucose-Capillary: 123 mg/dL — ABNORMAL HIGH (ref 70–99)
Glucose-Capillary: 129 mg/dL — ABNORMAL HIGH (ref 70–99)
Glucose-Capillary: 132 mg/dL — ABNORMAL HIGH (ref 70–99)
Glucose-Capillary: 140 mg/dL — ABNORMAL HIGH (ref 70–99)
Glucose-Capillary: 250 mg/dL — ABNORMAL HIGH (ref 70–99)

## 2024-06-24 LAB — BASIC METABOLIC PANEL WITH GFR
Anion gap: 10 (ref 5–15)
BUN: 37 mg/dL — ABNORMAL HIGH (ref 6–20)
CO2: 19 mmol/L — ABNORMAL LOW (ref 22–32)
Calcium: 8 mg/dL — ABNORMAL LOW (ref 8.9–10.3)
Chloride: 109 mmol/L (ref 98–111)
Creatinine, Ser: 1.5 mg/dL — ABNORMAL HIGH (ref 0.44–1.00)
GFR, Estimated: 47 mL/min — ABNORMAL LOW
Glucose, Bld: 145 mg/dL — ABNORMAL HIGH (ref 70–99)
Potassium: 4.6 mmol/L (ref 3.5–5.1)
Sodium: 139 mmol/L (ref 135–145)

## 2024-06-24 LAB — MAGNESIUM: Magnesium: 2.5 mg/dL — ABNORMAL HIGH (ref 1.7–2.4)

## 2024-06-24 LAB — TRIGLYCERIDES: Triglycerides: 91 mg/dL

## 2024-06-24 MED ORDER — LACTATED RINGERS IV BOLUS
500.0000 mL | Freq: Once | INTRAVENOUS | Status: AC
  Administered 2024-06-24: 500 mL via INTRAVENOUS

## 2024-06-24 MED ORDER — INSULIN GLARGINE-YFGN 100 UNIT/ML ~~LOC~~ SOLN
4.0000 [IU] | Freq: Every day | SUBCUTANEOUS | Status: DC
  Administered 2024-06-24 – 2024-06-27 (×4): 4 [IU] via SUBCUTANEOUS
  Filled 2024-06-24 (×4): qty 0.04

## 2024-06-24 MED ORDER — INSULIN ASPART 100 UNIT/ML IJ SOLN
0.0000 [IU] | Freq: Four times a day (QID) | INTRAMUSCULAR | Status: DC
  Administered 2024-06-25 (×2): 2 [IU] via SUBCUTANEOUS
  Administered 2024-06-26: 3 [IU] via SUBCUTANEOUS
  Filled 2024-06-24: qty 2
  Filled 2024-06-24: qty 3
  Filled 2024-06-24: qty 2

## 2024-06-24 MED ORDER — FENTANYL BOLUS VIA INFUSION: 25.0000 ug | INTRAVENOUS | Status: DC | PRN

## 2024-06-24 MED ORDER — ORAL CARE MOUTH RINSE: 15.0000 mL | OROMUCOSAL | Status: AC | PRN

## 2024-06-24 MED ORDER — FENTANYL CITRATE (PF) 50 MCG/ML IJ SOSY
25.0000 ug | PREFILLED_SYRINGE | Freq: Once | INTRAMUSCULAR | Status: AC
  Administered 2024-06-24: 25 ug via INTRAVENOUS

## 2024-06-24 MED ORDER — FENTANYL 2500MCG IN NS 250ML (10MCG/ML) PREMIX INFUSION
0.0000 ug/h | INTRAVENOUS | Status: DC
  Administered 2024-06-24: 25 ug/h via INTRAVENOUS
  Filled 2024-06-24: qty 250

## 2024-06-24 MED ORDER — DEXMEDETOMIDINE HCL IN NACL 400 MCG/100ML IV SOLN
0.0000 ug/kg/h | INTRAVENOUS | Status: DC
  Administered 2024-06-24: 0.4 ug/kg/h via INTRAVENOUS
  Filled 2024-06-24: qty 100

## 2024-06-24 MED ORDER — DEXMEDETOMIDINE HCL IN NACL 400 MCG/100ML IV SOLN: 0.0000 ug/kg/h | INTRAVENOUS | Status: DC

## 2024-06-24 NOTE — Progress Notes (Signed)
 Nurse and NP, [place], at bedside. Pt placed on SBT 5/5. Nurse, NP, and MD Claudene monitoring pt's volumes and WOB.  MD smith advised to leave pt on SBT for a couple of hrs. Pt resting comfortably in bed and this nurse monitoring signs of increased WOB and decreased oxygenation.

## 2024-06-24 NOTE — Progress Notes (Signed)
 Pt. Extubated to 4lnc,no apparent distress noted at this time.Vitals WNL.

## 2024-06-24 NOTE — Progress Notes (Signed)
 PHARMACY CONSULT NOTE  Pharmacy Consult for Electrolyte Monitoring and Replacement   Recent Labs: Potassium (mmol/L)  Date Value  06/24/2024 4.6  03/01/2013 4.1   Magnesium  (mg/dL)  Date Value  97/98/7973 2.5 (H)   Calcium  (mg/dL)  Date Value  97/98/7973 8.0 (L)   Calcium , Total (mg/dL)  Date Value  89/90/7985 9.1   Albumin  (g/dL)  Date Value  98/69/7973 2.8 (L)  12/21/2023 3.3 (L)  03/01/2013 3.9   Phosphorus (mg/dL)  Date Value  98/68/7973 3.7   Sodium (mmol/L)  Date Value  06/24/2024 139  12/21/2023 145 (H)  03/01/2013 133 (L)   Assessment: 34 y.o. female with medical history significant for NICM, CHF, DVT, HTN, esophageal ulcer, PEA arrest, C. Diff, CKD III, type 1 diabetes mellitus (brittle), peripheral neuropathy, suspected EPI (not tested), anxiety, depression, hypertension, severe malnutrition, diabetic gastroparesis requiring J-tube (s/p open placement 06/13/22, dislodged 06/2023), GERD, autoimmune thyroid  disease, celiac disease (reported/neg on biopsy), anemia, autoimmune polyendocrine syndrome type 2, adrenal insufficiency, hyperemesis in pregnancy and shingles who is admitted with AMS and hypoglycemia requiring intubation for airway protection. Pharmacy is asked to follow and replace electrolytes while in CCU  Goal of Therapy:  Electrolytes WNL  Plan:  --no electrolyte replacement warranted for now --Follow-up electrolytes with AM labs tomorrow  Ashley Camacho 06/24/2024 7:19 AM

## 2024-06-24 NOTE — Progress Notes (Signed)
 "  NAME:  Ashley Camacho, MRN:  [id number], DOB:  02-17-1991, LOS: 7 ADMISSION DATE:  06/17/2024 History of Present Illness:  34 y/o female with a medical history as indicated below whop presented to the ED via EMS after being found unresponsive. History is obtained from ED records, EMS records and from speaking with patient's mother. Per EMS records, patient was found unresponsive by her friend. Total downtime is uncertain. Blood glucose with EMS was 27 mg/dl. She was given an amp of glucagon  and blood glucose improved to 44 mg/dl.   Upon arrival in the ED, patient was still unresponsive, hypothermic, hypoxic on 3L [place] and pupils were dilated. An I/O was placed in her right leg. She given an amp of D50. Glucose improved to 252 mg/dl. Despite increase in blood glucose, she remained unresponsive and hypoxic hence she was emergently intubated. PCCM consulted for further management.  ED work-up: CT head negative. Chest x-ray shows left lower lobe atelectasis. Labs pending   Of note, patient was diagnosed with influenza A on 05/27/24 and was hospitalized from 1/4-05/30/2024. Per patient's mother, she is a very fragile diabetic. She states that she has been buying groceries for patient because she is not able to afford food. Patient lives with friends. Unfortunately she does not have the contact information for patient's friend who called EMS or the friends with whom she live   Significant Hospital Events: Including procedures, antibiotic start and stop dates in addition to other pertinent events   01/25: Admitted with severe hypoglycemia and acute hypoxic respiratory failure  01/26: Pt now with DKA requiring insulin  gtt remains mechanically intubated on minimal vent settings.  Mentation precluding extubation at this time  01/27: Pt transitioned off insulin  gtt 01/26.  MRI Brain negative due to continued acute encephalopathy due to concerns of possible meningitis continuing cetriaxone and added  vancomycin /acyclovir  and pt pending LP today.  Pts hgb 6.3 with no signs of active bleeding receiving 1 unit pRBC's 1/28: No acute events overnight, remains encephalopathic which is precluding extubation.  Hemodynamically stable, no vasopressors. LP workup inconclusive, consult ID.  Decreased Solu-cortef  given hypertension. 1/29: Mentation precluding extubation. Discontinue vancomycin  per ID recommendation. Deescalated steroids due to HTN.  Neurology consulted due to continued acute encephalopathy recommended Cerebell for 24 hrs and addressing metabolic issues  8/69: No acute events overnight. Hypoglycemia to 50s, decreased Lantus  to daily 1/31: Pt remains mechanically intubated on minimal vent settings, however mentation precludes extubation.  Pt currently on Cerebell     Interim History / Subjective:  -- Patient extubated this AM. -- Protecting her Airways and respiratory status is stable.  -- She remains somewhat encephalopathic but does respond to voice.  -- Enteral access remains an issue. Might need an NGT later today.  -- We are holding multiple PO meds including psych meds.   Objective    Blood pressure 128/76, pulse 89, temperature 98.3 F (36.8 C), temperature source Axillary, resp. rate (!) 21, height 5' 0.98 (1.549 m), weight 66.4 kg, SpO2 100%.    Vent Mode: PRVC FiO2 (%):  [24 %] 24 % Set Rate:  [18 bmp] 18 bmp Vt Set:  [400 mL] 400 mL PEEP:  [5 cmH20] 5 cmH20   Intake/Output Summary (Last 24 hours) at 06/24/2024 1108 Last data filed at 06/24/2024 0900 Gross per 24 hour  Intake 1478.21 ml  Output 1400 ml  Net 78.21 ml   Filed Weights   06/22/24 0500 06/23/24 0425 06/24/24 0344  Weight: 61.6 kg 64.4 kg  66.4 kg    Examination: General: Resting in bed post extubation. No acute respiratory distress. Encephalopathic.  HENT: Supple neck, reactive pupils  Lungs: Clear bilateral air entry Cardiovascular: Normal S1, Nomral S2, RRR Abdomen: Soft, non tender, non distended,  +BS Extremities: Warm and well perfused.   Labs and imaging were reviewed.   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    Neurology: Off sedation. Extubated this AM. Holding home  TCA and Depakote . Will confirm with family members and restart once enteral access established.    Cardiovascular - has not required vasopressors, and has responded well to volume resuscitation.  - Will need to resume GDMT gradually once we get    Pulmonary - intubated due to encephalopathy, inability to protect her airway. Currently mechanically ventilated with minimal vent settings. Mental status is barrier to extubation.   Gastrointestinal - TF on hold. Will likely need an NGT.    Renal - Monitor UOP, avoid nephrotx agents. Cr daily.      Endocrine - severe hypoglycemia on presentation, responded to D50. Underlying T1DM, and subsequently developed DKA shortly after admission. Unclear if her hypoglycemic episode was due to sepsis, iatrogenic from insulin , or due to food insecurity. Adrenal crisis also a possibility in the setting of her known adrenal insufficiency.              -basal bolus regimen for insulin  - Adjust as patient was hyopglycemic              -continue hydrocortisone              -continue levothyroxine    Hem/Onc - DVT prophylaxis with heparin  subQ ordered   ID - broad spectrum antibiotics to cover for sepsis. Receiving empiric meningitis pending culture results. Consulted with ID regarding pleocytosis, recs appreciated. Did discontinue meningitis coverage.             -follow CSF cultures   Critical care time: 50 minutes    Darrin Barn, MD [facility] 06/24/2024 11:37 AM         "

## 2024-06-24 NOTE — Progress Notes (Signed)
 06/24/2024 E-Link  Getting pretty worked up, will try some fent + precedex  gtt; consider extubation trial in am since vent mechanics don't look bad.  Rolan Sharps MD PCCM

## 2024-06-24 NOTE — Progress Notes (Signed)
 Pt exhibiting signs of being extremely uncomfortable with the ETT. She is coughing, gagging, and trying to get out of bed. Pt throwing legs towards the edge of bed and sitting all the way up. This nurse administered fent and versed  but pt back to trying to get up again shortly after. MD Claudene made aware. Orders for fent and precedex  in place to help pt remain safety in bed and help her be more comfortable.

## 2024-06-25 ENCOUNTER — Inpatient Hospital Stay

## 2024-06-25 LAB — BASIC METABOLIC PANEL WITH GFR
Anion gap: 12 (ref 5–15)
BUN: 35 mg/dL — ABNORMAL HIGH (ref 6–20)
CO2: 20 mmol/L — ABNORMAL LOW (ref 22–32)
Calcium: 8.2 mg/dL — ABNORMAL LOW (ref 8.9–10.3)
Chloride: 113 mmol/L — ABNORMAL HIGH (ref 98–111)
Creatinine, Ser: 1.48 mg/dL — ABNORMAL HIGH (ref 0.44–1.00)
GFR, Estimated: 47 mL/min — ABNORMAL LOW
Glucose, Bld: 117 mg/dL — ABNORMAL HIGH (ref 70–99)
Potassium: 4.7 mmol/L (ref 3.5–5.1)
Sodium: 144 mmol/L (ref 135–145)

## 2024-06-25 LAB — GLUCOSE, CAPILLARY
Glucose-Capillary: 161 mg/dL — ABNORMAL HIGH (ref 70–99)
Glucose-Capillary: 117 mg/dL — ABNORMAL HIGH (ref 70–99)
Glucose-Capillary: 112 mg/dL — ABNORMAL HIGH (ref 70–99)
Glucose-Capillary: 154 mg/dL — ABNORMAL HIGH (ref 70–99)
Glucose-Capillary: 158 mg/dL — ABNORMAL HIGH (ref 70–99)
Glucose-Capillary: 138 mg/dL — ABNORMAL HIGH (ref 70–99)
Glucose-Capillary: 45 mg/dL — ABNORMAL LOW (ref 70–99)
Glucose-Capillary: 93 mg/dL (ref 70–99)

## 2024-06-25 LAB — MAGNESIUM: Magnesium: 2.3 mg/dL (ref 1.7–2.4)

## 2024-06-25 LAB — CBC
HCT: 26.6 % — ABNORMAL LOW (ref 36.0–46.0)
Hemoglobin: 8.7 g/dL — ABNORMAL LOW (ref 12.0–15.0)
MCH: 29.9 pg (ref 26.0–34.0)
MCHC: 32.7 g/dL (ref 30.0–36.0)
MCV: 91.4 fL (ref 80.0–100.0)
Platelets: 172 10*3/uL (ref 150–400)
RBC: 2.91 MIL/uL — ABNORMAL LOW (ref 3.87–5.11)
RDW: 12.7 % (ref 11.5–15.5)
WBC: 8 10*3/uL (ref 4.0–10.5)
nRBC: 0 % (ref 0.0–0.2)

## 2024-06-25 MED ORDER — METOPROLOL TARTRATE 25 MG PO TABS
12.5000 mg | ORAL_TABLET | Freq: Two times a day (BID) | ORAL | Status: DC
  Filled 2024-06-25: qty 1

## 2024-06-25 MED ORDER — PIVOT 1.5 CAL PO LIQD: 1000.0000 mL | ORAL | Status: DC

## 2024-06-25 MED ORDER — FREE WATER
30.0000 mL | Status: DC
  Administered 2024-06-25 – 2024-06-26 (×2): 30 mL

## 2024-06-25 MED ORDER — DEXTROSE 50 % IV SOLN
25.0000 g | INTRAVENOUS | Status: AC
  Administered 2024-06-25: 25 g via INTRAVENOUS
  Filled 2024-06-25: qty 50

## 2024-06-25 MED ORDER — LOSARTAN POTASSIUM 50 MG PO TABS: 50.0000 mg | ORAL_TABLET | Freq: Every day | ORAL | Status: DC

## 2024-06-25 MED ORDER — VITAL 1.5 CAL PO LIQD
1000.0000 mL | ORAL | Status: AC
  Administered 2024-06-25 – 2024-06-26 (×2): 1000 mL

## 2024-06-25 NOTE — Progress Notes (Signed)
 PHARMACY CONSULT NOTE  Pharmacy Consult for Electrolyte Monitoring and Replacement   Recent Labs: Potassium (mmol/L)  Date Value  06/25/2024 4.7  03/01/2013 4.1   Magnesium  (mg/dL)  Date Value  97/97/7973 2.3   Calcium  (mg/dL)  Date Value  97/97/7973 8.2 (L)   Calcium , Total (mg/dL)  Date Value  89/90/7985 9.1   Albumin  (g/dL)  Date Value  98/69/7973 2.8 (L)  12/21/2023 3.3 (L)  03/01/2013 3.9   Phosphorus (mg/dL)  Date Value  98/68/7973 3.7   Sodium (mmol/L)  Date Value  06/25/2024 144  12/21/2023 145 (H)  03/01/2013 133 (L)   Assessment: 34 y.o. female with medical history significant for NICM, CHF, DVT, HTN, esophageal ulcer, PEA arrest, C. Diff, CKD III, type 1 diabetes mellitus (brittle), peripheral neuropathy, suspected EPI (not tested), anxiety, depression, hypertension, severe malnutrition, diabetic gastroparesis requiring J-tube (s/p open placement 06/13/22, dislodged 06/2023), GERD, autoimmune thyroid  disease, celiac disease (reported/neg on biopsy), anemia, autoimmune polyendocrine syndrome type 2, adrenal insufficiency, hyperemesis in pregnancy and shingles who is admitted with AMS and hypoglycemia requiring intubation for airway protection. Pharmacy is asked to follow and replace electrolytes while in CCU  Goal of Therapy:  Electrolytes WNL  Plan:  --No electrolyte replacement indicated today --Follow-up electrolytes with AM labs tomorrow  Marolyn KATHEE Mare 06/25/2024 7:41 AM

## 2024-06-25 NOTE — Evaluation (Addendum)
 Physical Therapy Evaluation Patient Details Name: Ashley Camacho MRN: [id number] DOB: 06/02/1990 Today's Date: 06/25/2024  History of Present Illness  Pt is a 34 y/o F admitted on 06/17/24 after presenting after being found unresponsive. Pt admitted for severe hypoglycemia & acute hypoxic respiratory failure, required intubation, extubated 06/24/24. PMH: DM1, CKA, CKD, HTN, gastroparesis, adrenal insufficiency  Clinical Impression  Pt seen for PT evaluation with pt received in bed, no family/friends present & pt unable to provide PLOF, home set up information. Pt grimacing, groaning during session but does not verbalize any words. Pt requires max<>total assist +2 for bed mobility, max assist +2 for sit>stand x2 attempts from EOB. Pt would benefit from ongoing PT services to progress mobility & decrease caregiver burden as able.        If plan is discharge home, recommend the following: Two people to help with walking and/or transfers;Assistance with feeding;Assist for transportation;Direct supervision/assist for medications management;Two people to help with bathing/dressing/bathroom;Help with stairs or ramp for entrance;Assistance with cooking/housework;Direct supervision/assist for financial management;Supervision due to cognitive status   Can travel by private vehicle   No    Equipment Recommendations None recommended by PT (defer to next venue)  Recommendations for Other Services       Functional Status Assessment Patient has had a recent decline in their functional status and demonstrates the ability to make significant improvements in function in a reasonable and predictable amount of time.     Precautions / Restrictions Precautions Precautions: Fall Restrictions Weight Bearing Restrictions Per Provider Order: No      Mobility  Bed Mobility Overal bed mobility: Needs Assistance Bed Mobility: Supine to Sit, Sit to Supine     Supine to sit: Max assist, Total assist, HOB  elevated, Used rails, +2 for safety/equipment, +2 for physical assistance (exit R side of bed) Sit to supine: Total assist, +2 for physical assistance, +2 for safety/equipment, Used rails, HOB elevated        Transfers Overall transfer level: Needs assistance Equipment used: 2 person hand held assist Transfers: Sit to/from Stand Sit to Stand: Max assist, +2 physical assistance, +2 safety/equipment, From elevated surface           General transfer comment: sit>Stand x 2 from EOB with assistance to initiate    Ambulation/Gait                  Stairs            Wheelchair Mobility     Tilt Bed    Modified Rankin (Stroke Patients Only)       Balance Overall balance assessment: Needs assistance Sitting-balance support: Feet supported Sitting balance-Leahy Scale: Fair Sitting balance - Comments: CGA static sitting EOB   Standing balance support: Bilateral upper extremity supported, During functional activity, Reliant on assistive device for balance Standing balance-Leahy Scale: Poor                               Pertinent Vitals/Pain Pain Assessment Pain Assessment: PAINAD Breathing: normal Negative Vocalization: occasional moan/groan, low speech, negative/disapproving quality Facial Expression: facial grimacing Body Language: tense, distressed pacing, fidgeting Consolability: distracted or reassured by voice/touch PAINAD Score: 5    Home Living Family/patient expects to be discharged to:: Unsure                        Prior Function Prior Level of Function : Patient poor historian/Family  not available                     Extremity/Trunk Assessment   Upper Extremity Assessment Upper Extremity Assessment: Generalized weakness (BUE/BLE edema, brusing to BUE arms, hands)    Lower Extremity Assessment Lower Extremity Assessment: Generalized weakness (BUE/BLE edema)       Communication    Communication Communication: Impaired Factors Affecting Communication: Difficulty expressing self    Cognition Arousal: Lethargic Behavior During Therapy: Flat affect   PT - Cognitive impairments: No family/caregiver present to determine baseline, Difficult to assess, Orientation, Memory, Attention, Initiation, Sequencing, Problem solving, Safety/Judgement, Awareness Difficult to assess due to: Level of arousal                       Following commands: Impaired Following commands impaired: Follows one step commands inconsistently, Follows one step commands with increased time     Cueing Cueing Techniques: Verbal cues, Gestural cues, Visual cues, Tactile cues     General Comments General comments (skin integrity, edema, etc.): BP supine at end of session: 136/93 mmHg MAP 107    Exercises     Assessment/Plan    PT Assessment Patient needs continued PT services  PT Problem List Decreased strength;Decreased coordination;Pain;Decreased cognition;Decreased range of motion;Decreased activity tolerance;Decreased knowledge of use of DME;Decreased balance;Decreased safety awareness;Decreased mobility;Decreased knowledge of precautions;Cardiopulmonary status limiting activity       PT Treatment Interventions Therapeutic exercise;DME instruction;Gait training;Balance training;Stair training;Neuromuscular re-education;Cognitive remediation;Functional mobility training;Therapeutic activities;Patient/family education;Modalities;Manual techniques    PT Goals (Current goals can be found in the Care Plan section)  Acute Rehab PT Goals PT Goal Formulation: Patient unable to participate in goal setting Time For Goal Achievement: 07/09/24 Potential to Achieve Goals: Fair    Frequency Min 2X/week     Co-evaluation PT/OT/SLP Co-Evaluation/Treatment: Yes Reason for Co-Treatment: Complexity of the patient's impairments (multi-system involvement);Necessary to address cognition/behavior  during functional activity;For patient/therapist safety;To address functional/ADL transfers PT goals addressed during session: Mobility/safety with mobility;Balance         AM-PAC PT 6 Clicks Mobility  Outcome Measure Help needed turning from your back to your side while in a flat bed without using bedrails?: Total Help needed moving from lying on your back to sitting on the side of a flat bed without using bedrails?: Total Help needed moving to and from a bed to a chair (including a wheelchair)?: Total Help needed standing up from a chair using your arms (e.g., wheelchair or bedside chair)?: Total Help needed to walk in hospital room?: Total Help needed climbing 3-5 steps with a railing? : Total 6 Click Score: 6    End of Session   Activity Tolerance: Patient limited by lethargy;Patient limited by fatigue Patient left: in bed;with call bell/phone within reach;with bed alarm set Nurse Communication: Mobility status PT Visit Diagnosis: Muscle weakness (generalized) (M62.81);Other abnormalities of gait and mobility (R26.89);Difficulty in walking, not elsewhere classified (R26.2);Unsteadiness on feet (R26.81)    Time: 1208-1224 PT Time Calculation (min) (ACUTE ONLY): 16 min   Charges:   PT Evaluation $PT Eval High Complexity: 1 High   PT General Charges $$ ACUTE PT VISIT: 1 Visit         Richerd Pinal, PT, DPT 06/25/24, 12:54 PM   Richerd CHRISTELLA Pinal 06/25/2024, 12:54 PM

## 2024-06-25 NOTE — Plan of Care (Signed)

## 2024-06-25 NOTE — Evaluation (Signed)
 Clinical/Bedside Swallow Evaluation Patient Details  Name: Ashley Camacho MRN: [id number] Date of Birth: Oct 31, 1990  Today's Date: 06/25/2024 Time: SLP Start Time (ACUTE ONLY): 1105 SLP Stop Time (ACUTE ONLY): 1125 SLP Time Calculation (min) (ACUTE ONLY): 20 min  Past Medical History:  Past Medical History:  Diagnosis Date   Acute metabolic encephalopathy 06/10/2022   Adrenal insufficiency    Anemia    Anxiety    Blood transfusion without reported diagnosis    CHF (congestive heart failure) (HCC)    Chronic kidney disease    Depression    Gastroparesis    Hypertension    Hypotension    Intractable nausea and vomiting 11/13/2022   Migraine    Thyroid  disease    Type 1 diabetes (HCC)    Past Surgical History:  Past Surgical History:  Procedure Laterality Date   BIOPSY  01/14/2021   Procedure: BIOPSY;  Surgeon: Eda Iha, MD;  Location: [facility] ENDOSCOPY;  Service: Gastroenterology;;   CENTRAL LINE INSERTION N/A 10/10/2023   Procedure: CENTRAL LINE INSERTION;  Surgeon: Marea Selinda RAMAN, MD;  Location: [facility] INVASIVE CV LAB;  Service: Cardiovascular;  Laterality: N/A;   CESAREAN SECTION     x2   CESAREAN SECTION WITH BILATERAL TUBAL LIGATION N/A 02/17/2021   Procedure: CESAREAN SECTION WITH BILATERAL TUBAL LIGATION;  Surgeon: Barbra Lang PARAS, DO;  Location: [facility] LD ORS;  Service: Obstetrics;  Laterality: N/A;   ESOPHAGOGASTRODUODENOSCOPY (EGD) WITH PROPOFOL  N/A 01/14/2021   Procedure: ESOPHAGOGASTRODUODENOSCOPY (EGD) WITH PROPOFOL ;  Surgeon: Eda Iha, MD;  Location: [facility] ENDOSCOPY;  Service: Gastroenterology;  Laterality: N/A;   IR BONE MARROW BIOPSY & ASPIRATION  12/16/2022   IR REPLC DUODEN/JEJUNO TUBE PERCUT W/FLUORO  11/22/2022   JEJUNOSTOMY N/A 06/13/2022   Procedure: DEXTER;  Surgeon: Jordis Laneta FALCON, MD;  Location: [facility] ORS;  Service: General;  Laterality: N/A;   MOUTH SURGERY     TEE WITHOUT CARDIOVERSION N/A 01/06/2024   Procedure: ECHOCARDIOGRAM,  TRANSESOPHAGEAL;  Surgeon: Rolan Ezra RAMAN, MD;  Location: [facility] ORS;  Service: Cardiovascular;  Laterality: N/A;   TUBAL LIGATION     HPI:  Pt is a 34 y/o F admitted on 06/17/24 after presenting after being found unresponsive. Pt admitted for severe hypoglycemia & acute hypoxic respiratory failure, required intubation, extubated 06/24/24. PMH:  uncontrolled DM1, multiple sequelae of uncontrolled diabetes including CKD, neuropathy, gastroparesis with J-tube placement, severe protein calorie malnutrition (suspected eating disorder), HFrEF 30%, history of cardiac arrest, abnormal weight loss, hypertension, depression, AOCD, GERD, anorexia, DVT, and transaminitis.    Assessment / Plan / Recommendation  Clinical Impression  Pt remains inappropriate for PO intake d/t inability to participate in PO trials. When SLP entered pt's room, pt didn't display any response to touch, her name or sternal rub. Pt's nurse help with writer reposition pt with with movement, pt grunted/moaned (phonation heard) as well as frowning. Pt positioned upright in bed with pillows to physically support this position. Pt rubbed her face and eyes, grunted, grown and then physically overcame the above physical support for positioning to slide down in the bed. She had her eyes open and then immediately closed her eyes letting her head fall to her left shoulder. Ice chips on spoon was presented to pt's lips with her grunting and moving her head away from spoon.    Given the above as well as pt's nonverbal status (lack of any volitional attempts at communication) and pt's history of benefiting from psychiatric consults, asked treatment team for consult during this admission.  Pt to remain NPO at this time.    SLP Visit Diagnosis: Dysphagia, unspecified (R13.10);Cognitive communication deficit (R41.841)    Aspiration Risk  Risk for inadequate nutrition/hydration    Diet Recommendation    NPO       Other Recommendations Oral Care  Recommendations: Oral care QID     Swallow Evaluation Recommendations Recommendations: NPO Medication Administration: Via alternative means Oral care recommendations: Oral care QID (4x/day) Recommended consults:  (psychiatry consult)   Assistance Recommended at Discharge    Functional Status Assessment Patient has had a recent decline in their functional status and/or demonstrates limited ability to make significant improvements in function in a reasonable and predictable amount of time  Frequency and Duration min 2x/week  2 weeks       Prognosis Prognosis for improved oropharyngeal function: Guarded Barriers to Reach Goals: Behavior;Severity of deficits;Time post onset;Motivation      Swallow Study   General Date of Onset: 06/17/24 HPI: Pt is a 34 y/o F admitted on 06/17/24 after presenting after being found unresponsive. Pt admitted for severe hypoglycemia & acute hypoxic respiratory failure, required intubation, extubated 06/24/24. PMH:  uncontrolled DM1, multiple sequelae of uncontrolled diabetes including CKD, neuropathy, gastroparesis with J-tube placement, severe protein calorie malnutrition (suspected eating disorder), HFrEF 30%, history of cardiac arrest, abnormal weight loss, hypertension, depression, AOCD, GERD, anorexia, DVT, and transaminitis. Type of Study: Bedside Swallow Evaluation Previous Swallow Assessment: 2023 Diet Prior to this Study: NPO Temperature Spikes Noted: No Respiratory Status: Room air History of Recent Intubation: Yes Total duration of intubation (days): 6 days Date extubated: 06/24/24 Behavior/Cognition: Doesn't follow directions (appearance of potential behavioral responses such as closing her eyes and turning away from this clinical research associate) Oral Care Completed by SLP: Recent completion by staff Patient Positioning: Upright in bed Baseline Vocal Quality: Not observed Volitional Cough: Cognitively unable to elicit Volitional Swallow: Unable to elicit     Oral/Motor/Sensory Function     Ice Chips Ice chips: Not tested   Thin Liquid Thin Liquid: Not tested    Nectar Thick Nectar Thick Liquid: Not tested   Honey Thick Honey Thick Liquid: Not tested   Puree Puree: Not tested   Solid     Solid: Not tested     Yago Ludvigsen B. Rubbie, M.S., CCC-SLP, Tree Surgeon Certified Brain Injury Specialist [facility]  [facility] Rehabilitation Services Office [phone] Ascom [phone] Fax [phone]

## 2024-06-26 ENCOUNTER — Inpatient Hospital Stay

## 2024-06-26 ENCOUNTER — Encounter: Payer: Self-pay | Admitting: Adult Health

## 2024-06-26 DIAGNOSIS — F99 Mental disorder, not otherwise specified: Secondary | ICD-10-CM

## 2024-06-26 LAB — BASIC METABOLIC PANEL WITH GFR
Anion gap: 12 (ref 5–15)
BUN: 35 mg/dL — ABNORMAL HIGH (ref 6–20)
CO2: 22 mmol/L (ref 22–32)
Calcium: 8.5 mg/dL — ABNORMAL LOW (ref 8.9–10.3)
Chloride: 114 mmol/L — ABNORMAL HIGH (ref 98–111)
Creatinine, Ser: 1.55 mg/dL — ABNORMAL HIGH (ref 0.44–1.00)
GFR, Estimated: 45 mL/min — ABNORMAL LOW
Glucose, Bld: 154 mg/dL — ABNORMAL HIGH (ref 70–99)
Potassium: 4.2 mmol/L (ref 3.5–5.1)
Sodium: 148 mmol/L — ABNORMAL HIGH (ref 135–145)

## 2024-06-26 LAB — MAGNESIUM: Magnesium: 2.2 mg/dL (ref 1.7–2.4)

## 2024-06-26 LAB — GLUCOSE, CAPILLARY
Glucose-Capillary: 112 mg/dL — ABNORMAL HIGH (ref 70–99)
Glucose-Capillary: 210 mg/dL — ABNORMAL HIGH (ref 70–99)
Glucose-Capillary: 190 mg/dL — ABNORMAL HIGH (ref 70–99)
Glucose-Capillary: 169 mg/dL — ABNORMAL HIGH (ref 70–99)
Glucose-Capillary: 317 mg/dL — ABNORMAL HIGH (ref 70–99)
Glucose-Capillary: 290 mg/dL — ABNORMAL HIGH (ref 70–99)

## 2024-06-26 LAB — CBC
HCT: 27.8 % — ABNORMAL LOW (ref 36.0–46.0)
Hemoglobin: 9 g/dL — ABNORMAL LOW (ref 12.0–15.0)
MCH: 30.5 pg (ref 26.0–34.0)
MCHC: 32.4 g/dL (ref 30.0–36.0)
MCV: 94.2 fL (ref 80.0–100.0)
Platelets: 210 10*3/uL (ref 150–400)
RBC: 2.95 MIL/uL — ABNORMAL LOW (ref 3.87–5.11)
RDW: 12.8 % (ref 11.5–15.5)
WBC: 6.9 10*3/uL (ref 4.0–10.5)
nRBC: 0 % (ref 0.0–0.2)

## 2024-06-26 LAB — PHOSPHORUS: Phosphorus: 3.5 mg/dL (ref 2.5–4.6)

## 2024-06-26 MED ORDER — LOSARTAN POTASSIUM 50 MG PO TABS
50.0000 mg | ORAL_TABLET | Freq: Every day | ORAL | Status: AC
  Administered 2024-06-26 – 2024-06-29 (×4): 50 mg
  Filled 2024-06-26 (×4): qty 1

## 2024-06-26 MED ORDER — LABETALOL HCL 5 MG/ML IV SOLN: 5.0000 mg | INTRAVENOUS | Status: AC | PRN

## 2024-06-26 MED ORDER — INSULIN ASPART 100 UNIT/ML IJ SOLN: 0.0000 [IU] | Freq: Four times a day (QID) | INTRAMUSCULAR | Status: DC

## 2024-06-26 MED ORDER — INSULIN ASPART 100 UNIT/ML IJ SOLN: 0.0000 [IU] | INTRAMUSCULAR | Status: DC | PRN

## 2024-06-26 MED ORDER — INSULIN ASPART 100 UNIT/ML IJ SOLN
0.0000 [IU] | INTRAMUSCULAR | Status: AC
  Administered 2024-06-26: 4 [IU] via SUBCUTANEOUS
  Administered 2024-06-27: 3 [IU] via SUBCUTANEOUS
  Administered 2024-06-27: 2 [IU] via SUBCUTANEOUS
  Administered 2024-06-27 (×2): 1 [IU] via SUBCUTANEOUS
  Administered 2024-06-27 – 2024-06-28 (×4): 2 [IU] via SUBCUTANEOUS
  Administered 2024-06-28: 1 [IU] via SUBCUTANEOUS
  Administered 2024-06-28: 2 [IU] via SUBCUTANEOUS
  Administered 2024-06-29: 1 [IU] via SUBCUTANEOUS
  Administered 2024-06-29: 4 [IU] via SUBCUTANEOUS
  Administered 2024-06-29: 1 [IU] via SUBCUTANEOUS
  Administered 2024-06-29: 3 [IU] via SUBCUTANEOUS
  Filled 2024-06-26: qty 3
  Filled 2024-06-26: qty 2
  Filled 2024-06-26: qty 1
  Filled 2024-06-26: qty 4
  Filled 2024-06-26: qty 2
  Filled 2024-06-26: qty 1
  Filled 2024-06-26: qty 2
  Filled 2024-06-26 (×2): qty 1
  Filled 2024-06-26: qty 2
  Filled 2024-06-26: qty 4
  Filled 2024-06-26 (×2): qty 2
  Filled 2024-06-26: qty 1
  Filled 2024-06-26: qty 2
  Filled 2024-06-26: qty 3

## 2024-06-26 MED ORDER — FREE WATER
100.0000 mL | Status: DC
  Administered 2024-06-26 – 2024-06-29 (×20): 100 mL

## 2024-06-26 MED ORDER — GLUCERNA 1.5 CAL PO LIQD
1000.0000 mL | ORAL | Status: AC
  Administered 2024-06-26 – 2024-06-29 (×6): 1000 mL

## 2024-06-26 MED ORDER — METOPROLOL TARTRATE 25 MG PO TABS
12.5000 mg | ORAL_TABLET | Freq: Two times a day (BID) | ORAL | Status: AC
  Administered 2024-06-26 – 2024-06-29 (×8): 12.5 mg
  Filled 2024-06-26 (×8): qty 1

## 2024-06-26 NOTE — Inpatient Diabetes Management (Signed)
 Inpatient Diabetes Program Recommendations  AACE/ADA: New Consensus Statement on Inpatient Glycemic Control   Target Ranges:  Prepandial:   less than 140 mg/dL      Peak postprandial:   less than 180 mg/dL (1-2 hours)      Critically ill patients:  140 - 180 mg/dL    Latest Reference Range & Units 06/25/24 05:32 06/25/24 07:25 06/25/24 9:54 06/25/24 11:28 14:52 06/25/24 15:14 06/25/24 19:38 06/25/24 20:24 06/25/24 23:17 06/26/24 01:47  Glucose-Capillary 70 - 99 mg/dL 882 (H) 887 (H)    Semglee  4 units 154 (H)   Novolog  2 units 158 (H) 45 (L) 138 (H) 93 112 (H)   Review of Glycemic Control  Diabetes history: DM1  Outpatient Diabetes medications: Omnipod 5 insulin  pump with Novolog , Dexcom G7 CGM; off pump plan Lantus  11 units daily, Novolog  1 units per 20 grams of carbs, Novolog  1:70 (1 unit drops glucose 70 mg/dl) Current orders for Inpatient glycemic control: Semglee  4 units daily, Novolog  0-6 units Q6H; Solucortef 50 mg Q12H, Vital @ 40 ml/hr  Inpatient Diabetes Program Recommendations:    Insulin : Patient received Novolog  2 units at 14:52 on 2/2 and then CBG down to 45 mg/dl at 80:61 due to Novolog .  Please consider discontinuing Novolog  0-6 units Q6H and order custom Novolog  scale of   Novolog  correction scale 0-4 units Q6H      100-200          0 unit      201-270          1 units      271-300           2 units      301-399           3 units      400 or higher  4 units  Thanks, Earnie Gainer, RN, MSN, CDCES Diabetes Coordinator Inpatient Diabetes Program [phone] (Team Pager from 8am to 5pm)

## 2024-06-26 NOTE — Assessment & Plan Note (Signed)
·    Protonix 40 mg IV every 12 hours

## 2024-06-26 NOTE — Assessment & Plan Note (Signed)
 At baseline

## 2024-06-26 NOTE — Assessment & Plan Note (Signed)
 Levothyroxine  75 mcg per tube daily

## 2024-06-26 NOTE — Assessment & Plan Note (Signed)
VIDEO VISIT   [facility]   FOLLOW-UP      Verbal consent has been obtained from the patient to conduct a telemedicine video visit via [employer] to minimize exposure to COVID-19: Yes     Telemedicine Documentation Requirements  Originating site (Patient location): Home  Distant site (Provider location): [facility]   Provider and Title: Joshua Allen, MD  Consent obtained: Yes  Language, if applicable and if translator was required: English     Date: 02/05/2022  Patient Name: Ashley Camacho,Ashley Camacho    Collaborating Provider(Camacho):   - Patricia Crawford, NP (primary care)  - Tobias Chapman, MD (radiation oncology)   - Nicole Colgrove, MD (ENT)    Diagnosis:   Squamous cell carcinoma, p16 positive, 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 Started 01/19/2022.    Oncologic Diagnostic History:   - 10/22/2021: Patient seen by primary care provider with right earache and swollen right lymph node (onset ~1 week prior). Patient subsequently seen by ENT Dr. Timothy Egan who recommended imaging.  - 11/27/2021: Patient seen by ENT Dr. Nicole Colgrove with right ear pain a/w enlarged tonsil.  - 12/02/2021: CT neck with contrast revealed a mass lesion measuring ~2.8cm involving the peritonsillar soft tissues on the right c/w malignancy. The level was somewhat obscured by metallic streak artifact from dental hardware. Mildly prominent cervical lymph nodes were demonstrated, the largest discrete node identified at level II on the right measuring ~13mm. No necrotic lymph nodes were identified. 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  - 12/17/2021: Patient underwent direct laryngoscopy with biopsy by Dr. Colgrove. A 2.5cm mass was noted within the right superior pole of the  tonsil without tongue base involvement. Biopsies revealed squamous cell carcinoma, p16 positive.   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  - 01/19/2022: Patient started weekly cisplatin with XRT.    Interval History:   [name] reports to be feeling fair. She has tolerated treatment relatively well thus far, although not without some side effects. She has had some N/V. She also some increasing difficulty with swallowing. She is hungry, but unable to eat sufficiently due to her increasing dysphagia. She is interested in having a feeding tube placed. She feels that the IV fluid hydration has helped.    Review of Systems:   10-point review of systems covered in detail and negative, except as stated above under Interval History.    Past Medical History:     Past Medical History:   Diagnosis Date    Goiter     History of eye disorder     Surgery x3 with Dr. Lisker.     HPV (human papilloma virus) infection     Hypertension     Tonsil cancer     Vitamin D deficiency        Past Surgical History:     Past Surgical History:   Procedure Laterality Date    Cervix cryosurgery        HPV.    Detatched retina Right 1992    Detached retina right eye - total of 4 surgeries. Last surgery was 2002.     EYE SURGERY      FINGER SURGERY Left 2008    Left index finger amputated.        Family History:     Family History   Problem Relation Age of Onset    Hypertension Mother     Hypothyroidism Mother     Stroke Father     Coronary artery disease Father     Hypertension Father     Hyperlipidemia Father     Diabetes Father     Ovarian cancer Sister     Cancer Sister     Lung cancer Brother         Smoker    Breast cancer Maternal Grandmother     Breast cancer Maternal Cousin      Breast cancer Niece        Social History:     Social History     Socioeconomic History    Marital status: Single   Tobacco Use    Smoking status: Never    Smokeless tobacco: Never   Vaping Use    Vaping Use: Never used   Substance and Sexual Activity    Alcohol use: No    Drug use: Yes     Types: Marijuana     Comment: MJ q3 months    Sexual activity: Not Currently     Social Determinants of Health     Financial Resource Strain: Low Risk  (12/07/2021)    Overall Financial Resource Strain (CARDIA)     Difficulty of Paying Living Expenses: Not hard at all   Food Insecurity: No Food Insecurity (12/07/2021)    Hunger Vital Sign     Worried About Running Out of Food in the Last Year: Never true     Ran Out of Food in the Last Year: Never true   Transportation Needs: No Transportation Needs (12/07/2021)    PRAPARE - Transportation     Lack of Transportation (Medical): No     Lack of Transportation (Non-Medical): No   Physical Activity: Insufficiently Active (12/07/2021)    Exercise Vital Sign     Days of Exercise per Week: 3 days     Minutes of Exercise per Session: 20 min   Stress: Stress Concern Present (12/07/2021)    [employer] - Occupational Stress Questionnaire     Feeling of Stress : To some extent   Social Connections: Socially Isolated (12/07/2021)    Social Connection and Isolation Panel [NHANES]     Frequency of Communication with Friends and Family: Twice a week     Frequency of Social Gatherings with Friends and Family: Once a week     Attends Religious Services: Never     Active Member of Clubs or Organizations: No     Attends Club or Organization Meetings: Never     Marital Status: Divorced   Intimate Partner Violence: Not At Risk (12/07/2021)    Humiliation, Afraid, Rape, and Kick questionnaire     Fear of Current or Ex-Partner: No     Emotionally Abused: No     Physically Abused: No     Sexually Abused: No   Housing Stability: Low Risk  (12/07/2021)    Housing Stability Vital Sign      Unable to Pay for Housing in the Last Year: No       Number of Places Lived in the Last Year: 1     Unstable Housing in the Last Year: No       Allergies:     Allergies   Allergen Reactions    Sulfa Antibiotics Hives    Other Animal      cats    Other Environmental      Pollen, hayfever       Medications:     Current Outpatient Medications   Medication Sig Dispense Refill    acetaminophen (TYLENOL) 500 MG tablet Take 1 tablet (500 mg) by mouth every 6 (six) hours as needed for Pain      dexAMETHasone (DECADRON) 4 MG tablet Take 2 tablets (8 mg) by mouth once daily with food on Days 2-4 of each chemotherapy cycle. 30 tablet 1    famotidine (Pepcid) 20 MG tablet Take 1 tablet (20 mg) by mouth 2 (two) times daily 60 tablet 1    gabapentin (NEURONTIN) 100 MG capsule Take 1 capsule (100 mg) by mouth 3 (three) times daily 90 capsule 0    lisinopril (ZESTRIL) 20 MG tablet Take 1 tablet (20 mg) by mouth daily 90 tablet 1    ondansetron (ZOFRAN) 8 MG tablet Take 1 (8 mg) tablet by mouth every 8 hours as needed for nausea or vomiting (starting day 4 of each chemotherapy cycle). 30 tablet 1    ondansetron (ZOFRAN-ODT) 8 MG disintegrating tablet Take 1 tablet (8 mg) by mouth every 8 (eight) hours as needed for Nausea 30 tablet 1    prochlorperazine (COMPAZINE) 5 MG tablet Take 1 tab (5 mg) by mouth every 6 hours as needed for nausea/vomiting not controlled by ondansetron. May repeat in 90 min if not effective. 45 tablet 1    oxyCODONE (ROXICODONE) 5 MG/5ML solution Take 2 mLs (2 mg) by mouth every 6 (six) hours as needed for Pain 60 mL 0     No current facility-administered medications for this visit.       Physical Exam:     There were no vitals filed for this visit.    ECOG performance status 0 (fully active, able to carry on all pre-disease performance without restriction)    General appearance - alert, well appearing, and in no distress and normal appearing weight  Mental status - alert, oriented to person, place, and  time, normal mood, behavior, speech, dress, motor activity, and thought processes  Eyes - sclera anicteric, extraocular muscles intact  Neurological - alert, oriented, normal speech, no focal findings or movement disorder noted  Psychologic - pleasant, conversant, appropriate, cooperative      Laboratory:     Lab Results   Component Value Date    WBC 8.24 02/02/2022    HGB 13.6 02/02/2022    HCT 40.7 02/02/2022    MCV 82.2 02/02/2022    PLT 267 02/02/2022     Lab Results   Component Value Date    NEUTROABS 6.77 (H) 02/02/2022     Chemistry        Component Value Date/Time    NA 132 (L) 02/02/2022 0911    K 4.5 02/02/2022 0911    CL 103 02/02/2022 0911    CO2 26 02/02/2022 0911    BUN 21.0 (H) 02/02/2022 0911    CREAT 0.7 02/02/2022 0911    GLU 103 (H) 02/02/2022 0911        Component Value Date/Time    CA 9.5 02/02/2022 0911    ALKPHOS 50 01/31/2022 2147      AST 27 02/02/2022 0911    ALT 49 (H) 02/02/2022 0911    BILITOTAL 0.7 01/31/2022 2147          Radiology:   No new images to review for today'Camacho visit.       Pathology:   Right tonsil biopsies (12/17/2021)        Assessment & Plan:   Ashley Camacho is a 34 y.o. female who returns to clinic for follow-up.    Tonsil cancer: Oncologic history outlined above. 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  - Patient has met with ENT and Radiation Oncology to review her treatment options. Surgery is less favorable due to the presence of a retropharyngeal lymph node (cannot remove surgically). The patient is therefore receiving treatment with definitive chemoXRT.  - Continue with weekly cisplatin  x5 concurrently with XRT.   - Continue with weekly hydration while on treatment.   - Routine labs with each treatment.  - Patient has opted to forgo mediport placement and receive treatment peripherally.  - Patient has been referred to dentist, SLP, and nutritionist.   - She has lost 10 lbs since starting  treatment and reports increased dysphagia and interest in having a feeding tube placed. Will have her consult with GI.  - Patient has been referred to [facility].  - She will need to receive her Flu shot.    Goiter: Previously biopsied and benign. Further work-up not indicated at this time. She has been cleared by ENT to proceed with chemoXRT.  - Patient has established with ENT.    Family history of cancer: The patient has a notable family history of cancer.  - She is interested in consulting with the [facility] for germline testing after completion of chemoXRT for her tonsil cancer.    Disposition: All of the patient'Camacho questions were answered to her satisfaction. She will return to clinic in 2 weeks. She has my number and knows to call if any questions or concerns arise in the interim.    A total of 20 minutes were spent face-to-face with the patient during this encounter and over half of that time was spent on counseling and coordination of care.     Joshua Allen, M.D.  Hematology & Medical Oncology  [facility]

## 2024-06-26 NOTE — Progress Notes (Signed)
 Nutrition Follow-up  DOCUMENTATION CODES:   Not applicable  INTERVENTION:   -RD will follow for diet advancement and add supplements as appropriate -D/c Vital 1.5 -TF via NGT:  Glucerna 1.5 @ 20 ml/hr and increase by 10 ml every 4 hours to goal rate of 50 ml/hr.   100 ml free water  flush every 4 hours  Tube feeding regimen provides 1800 kcal (100% of needs), 99 grams of protein, and 911 ml of H2O. Total free water : 1511 ml daily    -Discussed change in care plan with RN and DM coordinator   NUTRITION DIAGNOSIS:   Inadequate oral intake related to inability to eat (pt sedated and ventilated) as evidenced by NPO status.  Ongoing  GOAL:   Provide needs based on ASPEN/SCCM guidelines  Met with TF  MONITOR:   Vent status, Labs, Weight trends, TF tolerance, Skin, I & O's  REASON FOR ASSESSMENT:   Consult Enteral/tube feeding initiation and management  ASSESSMENT:   34 y.o. female with medical history significant for NICM, CHF, DVT, HTN, esophageal ulcer, PEA arrest, C. Diff, CKD III, type 1 diabetes mellitus (brittle), peripheral neuropathy, suspected EPI (not tested), anxiety, depression, hypertension, severe malnutrition, diabetic gastroparesis requiring J-tube (s/p open placement 06/13/22, dislodged 06/2023), GERD, autoimmune thyroid  disease, celiac disease (reported/neg on biopsy), anemia, autoimmune polyendocrine syndrome type 2, adrenal insufficiency, hyperemesis in pregnancy and shingles who is admitted with AMS and hypoglycemia requiring intubation for airway protection.  1/25- OGT placed, KUB reveals gastric tube in appropriate position  1/26- MRI of brain WDL 1/27- s/p lumbar puncture 1/31- s/p EEG- suggestive of generalized cerebral dysfunction (encephalopathy); no seizures or epileptiform discharges were seen   2/1- extubated 2/2- s/p BSE- NPO, NGT placed- KUB revealed  tip in the antropyloric stomach or bulb of duodenum  Reviewed I/O's: -591 ml x 24 hours and  +10.3 L since admission  UOP: 859 ml sx 24 hours  Rectal tube output: 50 ml x 24 hours  Case discussed with SLP; patient exhibiting volitional behaviors and selective mutism. Nurse also reported patient clenched teeth when attempting temperature. Psych consult placed. Patient remains NPO with NGT for sole nutritional support. Vital 1.5 infusing at 20 ml/hr.   Patient sitting up in bed at time of visit. She did not arouse to voice or touch. No family present.   Reviewed weights; weight has ranged from 61.6-66.7 kg over the past 7 days.   Medications reviewed and include protonix .   Labs reviewed: Na: 148, CBGS: 45-210 (inpatient orders for glycemic control are 0-4 units insulin  aspart every 6 hours and 4 units insulin  glargine-yfgn daily).    NUTRITION - FOCUSED PHYSICAL EXAM:  Flowsheet Row Most Recent Value  Orbital Region No depletion  Upper Arm Region No depletion  Thoracic and Lumbar Region No depletion  Buccal Region No depletion  Temple Region No depletion  Clavicle Bone Region No depletion  Clavicle and Acromion Bone Region No depletion  Scapular Bone Region No depletion  Dorsal Hand No depletion  Patellar Region No depletion  Anterior Thigh Region No depletion  Posterior Calf Region No depletion  Edema (RD Assessment) Mild  Hair Reviewed  Eyes Reviewed  Mouth Reviewed  Skin Reviewed  Nails Reviewed    Diet Order:   Diet Order             Diet NPO time specified  Diet effective now                   EDUCATION NEEDS:  No education needs have been identified at this time  Skin:  Skin Assessment: Reviewed RN Assessment  Last BM:  06/26/24 (50 ml via rectal tube)  Height:   Ht Readings from Last 1 Encounters:  06/19/24 5' 0.98 (1.549 m)    Weight:   Wt Readings from Last 1 Encounters:  06/26/24 64.9 kg    Ideal Body Weight:  47.7 kg  BMI:  Body mass index is 27.05 kg/m.  Estimated Nutritional Needs:   Kcal:  1600-1800  Protein:   85-100 grams  Fluid:  1.6-1.8 L    Margery ORN, RD, LDN, CDCES Registered Dietitian III Certified Diabetes Care and Education Specialist If unable to reach this RD, please use RD Inpatient group chat on secure chat between hours of 8am-4 pm daily

## 2024-06-26 NOTE — Hospital Course (Addendum)
 [name] is a 34 year old female with history of adrenal insufficiency, insulin -dependent diabetes mellitus 1, gastroparesis, malnutrition status post J-tube placement, dilated cardiomyopathy, normocytic anemia, history of DVT not on anticoagulation at this time, CKD stage IIIb, hypertension, hypothyroid.  06/17/2024: EMS was called because patient was found to be unresponsive.  Per prior documentation, blood glucose with EMS was 27 mg/dL.  She was given 1 amp of glucagon  and blood glucose improved to 44 mg/dL.  In the ED, patient remained unresponsive, hypothermic, hypoxic on 3 L nasal cannula with pupils dilated.  An IO was placed in her right leg.  Patient was given 1 amp of D50.  Her blood glucose improved to 252 and despite increase in blood glucose, she remained unresponsive and hypoxic and was emergently intubated.  PCCM was consulted for admission.  1/26: Patient developed DKA and required insulin  gtt.  Patient remains mechanically intubated on minimal vent settings.  Her mentation precluded extubation at this time.  1/27: Patient transition off insulin  gtt.  MRI was negative due to continued acute encephalopathy and possible meningitis.  Patient was continued on ceftriaxone . Vancomycin /acyclovir  was added pending LP.  1/28: Patient remains intubated due to encephalopathy.  Hemodynamically stable, not requiring vasopressor support.  LP workup was inconclusive.  ID was consulted.  Solu-Cortef  was decreased due to hypotension.  1/29: Patient remains encephalopathic, precluding extubation.  ID recommended discontinuation of vancomycin .  Steroids continue to be de-escalated due to hypertension.  Neurology was consulted and recommended Ceribell for 24 hours and addressing metabolic issues.  1/30: No acute events overnight.  Hypoglycemia to the 50s, Lantus  was decreased.  1/31: Patient remains mechanically intubated on minimal vent settings.  Mentation continues to preclude extubation.   Patient is currently on Ceribell.  MRI brain wo contrast: Read as no acute intracranial findings. Bilateral sphenoid sinus air-fluid levels, can be seen in acute sinusitis but is most likely related to intubated status.  2/1: Patient mentation improved.  Patient extubated.  2/2:  Per PCCM progress note on 2/2, patient mental status improved. Patient remains nonverbal and not following commands.  Neurology recommends holding TCA and Depakote .  Can confirm with patient family if patient is taking and can be restarted gradually.  Patient transferred to TRH service.  2/3: I assumed care of the patient.  Patient on systemically 4 units daily.  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  Patient is following me with her eyes.  Patient opens and closes eyes on her own.

## 2024-06-26 NOTE — Assessment & Plan Note (Signed)
 Stable

## 2024-06-26 NOTE — Assessment & Plan Note (Signed)
 Losartan  50 mg daily per tube, metoprolol  tartrate 12.5 mg p.o. twice daily were resumed Hydralazine  10 to 20 mg IV every 4 hours as needed for SBP greater than 170 Labetalol  5 mg IV every 2 hours as needed for SBP greater than 170 if refractory to hydralazine 

## 2024-06-26 NOTE — Assessment & Plan Note (Addendum)
 Feeding tube in place.

## 2024-06-26 NOTE — Progress Notes (Signed)
 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 Today she was sitting upright in bed and I observed her opening her eyes to she who was walking into her room and then immediately closing her eyes when the person approached her bed. This occurred 3 times consistently. She also opened her eyes to see what I had in my hand (cup of ice) and immediately closing her eyes when I was speaking to her - She also moved her head and clinched her lips when presented with a spoon containing ice chip. Nursing also mentioned that she clinched her teeth when trying to get an oral temperature. Lastly she opened her eyes as I turned to leave but shutting them again.   Verbal education provided to Ashley Camacho on need to increase PO consumption to have NG tube removed.   This information was provided to Ashley Camacho's current treatment team as well as another request for psychiatry consult.   ST will follow Ashley Camacho x 1 additional session.    HPI HPI: Ashley Camacho is a 34 y/o F admitted on 06/17/24 after presenting after being found unresponsive. Ashley Camacho admitted for severe hypoglycemia & acute hypoxic respiratory failure, required intubation, extubated 06/24/24. PMH:  uncontrolled DM1, multiple sequelae of uncontrolled diabetes including CKD, neuropathy, gastroparesis with J-tube placement, severe protein calorie malnutrition (suspected eating disorder), HFrEF 30%, history of cardiac arrest, abnormal weight loss, hypertension, depression, AOCD, GERD, anorexia, DVT, and transaminitis.      SLP Plan  Continue with current plan of care        Swallow Evaluation Recommendations   Recommendations: NPO Medication  Administration: Via alternative means     Recommendations                    (psychiatry consult) Oral care QID   Frequent or constant Supervision/Assistance Dysphagia, unspecified (R13.10);Cognitive communication deficit (R41.841)     Continue with current plan of care     Shericka Johnstone  06/26/2024, 11:24 AM

## 2024-06-26 NOTE — Assessment & Plan Note (Signed)
 Solu-Cortef  50 mg IV twice daily

## 2024-06-27 LAB — GLUCOSE, CAPILLARY
Glucose-Capillary: 145 mg/dL — ABNORMAL HIGH (ref 70–99)
Glucose-Capillary: 166 mg/dL — ABNORMAL HIGH (ref 70–99)
Glucose-Capillary: 174 mg/dL — ABNORMAL HIGH (ref 70–99)
Glucose-Capillary: 198 mg/dL — ABNORMAL HIGH (ref 70–99)
Glucose-Capillary: 230 mg/dL — ABNORMAL HIGH (ref 70–99)
Glucose-Capillary: 232 mg/dL — ABNORMAL HIGH (ref 70–99)
Glucose-Capillary: 244 mg/dL — ABNORMAL HIGH (ref 70–99)
Glucose-Capillary: 251 mg/dL — ABNORMAL HIGH (ref 70–99)

## 2024-06-27 MED ORDER — INSULIN GLARGINE-YFGN 100 UNIT/ML ~~LOC~~ SOLN
4.0000 [IU] | Freq: Every day | SUBCUTANEOUS | Status: AC
  Administered 2024-06-28 – 2024-06-29 (×2): 4 [IU] via SUBCUTANEOUS
  Filled 2024-06-27 (×2): qty 0.04

## 2024-06-27 MED ORDER — INSULIN ASPART 100 UNIT/ML IJ SOLN: 3.0000 [IU] | INTRAMUSCULAR | Status: DC

## 2024-06-27 MED ORDER — INSULIN GLARGINE-YFGN 100 UNIT/ML ~~LOC~~ SOLN: 2.0000 [IU] | Freq: Every day | SUBCUTANEOUS | Status: DC

## 2024-06-27 MED ORDER — LORAZEPAM 2 MG/ML IJ SOLN
2.0000 mg | Freq: Once | INTRAMUSCULAR | Status: AC
  Administered 2024-06-27: 2 mg via INTRAVENOUS
  Filled 2024-06-27: qty 1

## 2024-06-27 MED ORDER — INSULIN ASPART 100 UNIT/ML IJ SOLN
2.0000 [IU] | INTRAMUSCULAR | Status: AC
  Administered 2024-06-27 – 2024-06-29 (×14): 2 [IU] via SUBCUTANEOUS
  Filled 2024-06-27 (×14): qty 2

## 2024-06-27 NOTE — Progress Notes (Signed)
 Speech Language Pathology Treatment:    Patient Details Name: Ashley Camacho MRN: [id number] DOB: Feb 18, 1991 Today's Date: 06/27/2024 Time: 9074-9064 SLP Time Calculation (min) (ACUTE ONLY): 10 min  Assessment / Plan / Recommendation Clinical Impression  Pt was laying in bed, eyes open, no ability to track this writer, gown/blankets down around pt's waist with breasts showing. Pt with no attempts to interact with this writer as I recovered her and sat her up in bed. No attempts at communication (gestural or verbal), no attempt to follow any directions. This clinical research associate presented a spoon with ice chips to pt's mouth. She continues to squeeze your lips together, turn head away from spoon with blowing on the spoon today. Multiple attempts made with support and education provided. Pt's team made aware of session. At this time, ST will sign off d/t 3 sessions with no improvement in ability to participate in skilled intervention.    HPI HPI: Pt is a 34 y/o F admitted on 06/17/24 after presenting after being found unresponsive. Pt admitted for severe hypoglycemia & acute hypoxic respiratory failure, required intubation, extubated 06/24/24. PMH:  uncontrolled DM1, multiple sequelae of uncontrolled diabetes including CKD, neuropathy, gastroparesis with J-tube placement, severe protein calorie malnutrition (suspected eating disorder), HFrEF 30%, history of cardiac arrest, abnormal weight loss, hypertension, depression, AOCD, GERD, anorexia, DVT, and transaminitis.      SLP Plan  Continue with current plan of care        Swallow Evaluation Recommendations   Recommendations: NPO Medication Administration: Via alternative means Oral care recommendations: Oral care QID (4x/day) Recommended consults:  (Psychiatry consult)     Recommendations                     Oral care QID   Frequent or constant Supervision/Assistance Dysphagia, unspecified (R13.10);Cognitive communication deficit (R41.841)      Continue with current plan of care    Ruthene Methvin B. Rubbie, M.S., CCC-SLP, Tree Surgeon Certified Brain Injury Specialist [facility]  [facility] Office [phone] Ascom [phone] Fax [phone]

## 2024-06-27 NOTE — Plan of Care (Signed)
  Problem: Clinical Measurements: Goal: Respiratory complications will improve Outcome: Progressing   Problem: Elimination: Goal: Will not experience complications related to bowel motility Outcome: Progressing   

## 2024-06-27 NOTE — Plan of Care (Signed)
" °  Problem: Health Behavior/Discharge Planning: Goal: Ability to manage health-related needs will improve Outcome: Progressing   Problem: Clinical Measurements: Goal: Respiratory complications will improve Outcome: Progressing   Problem: Clinical Measurements: Goal: Cardiovascular complication will be avoided Outcome: Progressing   Problem: Nutrition: Goal: Adequate nutrition will be maintained Outcome: Progressing   Problem: Pain Managment: Goal: General experience of comfort will improve and/or be controlled Outcome: Progressing   Problem: Safety: Goal: Ability to remain free from injury will improve Outcome: Progressing   "

## 2024-06-27 NOTE — TOC Progression Note (Signed)
 Transition of Care [facility]) - Progression Note    Patient Details  Name: Ashley Camacho MRN: [id number] Date of Birth: 30-Apr-1991  Transition [facility] CM/SW Contact  Grayce JAYSON Perfect, RN Phone Number: 06/27/2024, 4:24 PM  Clinical Narrative:   RNCM went to see patient in room.  RN and CNA present in room working with her at this time, will plan to see tomorrow      Barriers to Discharge: Continued Medical Work up               Expected Discharge Plan and Services                                               Social Drivers of Health (SDOH) Interventions SDOH Screenings   Food Insecurity: Patient Unable To Answer (06/17/2024)  Recent Concern: Food Insecurity - Food Insecurity Present (05/30/2024)  Housing: Patient Unable To Answer (06/17/2024)  Recent Concern: Housing - High Risk (05/30/2024)  Transportation Needs: Patient Unable To Answer (06/17/2024)  Utilities: Patient Unable To Answer (06/17/2024)  Alcohol Screen: Low Risk (04/11/2024)  Depression (PHQ2-9): Medium Risk (06/13/2024)  Financial Resource Strain: Medium Risk (04/11/2024)  Physical Activity: Insufficiently Active (04/11/2024)  Social Connections: Socially Isolated (04/11/2024)  Stress: Stress Concern Present (04/11/2024)  Tobacco Use: Low Risk (06/26/2024)  Health Literacy: Adequate Health Literacy (12/21/2023)    Readmission Risk Interventions    06/18/2024   12:07 PM 01/04/2024   12:41 PM 11/15/2023    4:19 PM  Readmission Risk Prevention Plan  Transportation Screening  Complete Complete  Medication Review (RN Care Manager) Complete Complete Complete  PCP or Specialist appointment within 3-5 days of discharge Complete Complete Complete  HRI or Home Care Consult Complete    SW Recovery Care/Counseling Consult Complete Complete Complete  Palliative Care Screening Not Applicable Not Applicable Not Applicable  Skilled Nursing Facility Not Applicable Not Applicable Not Applicable

## 2024-06-27 NOTE — Progress Notes (Signed)
 " PROGRESS NOTE    Ashley Camacho   FMW:[id number] DOB: 07-23-1990  DOA: 06/17/2024 Date of Service: 06/27/24 which is hospital day 10  PCP: Ostwalt, [name], PA-C    [name] is a 34 year old female with history of adrenal insufficiency, insulin -dependent diabetes mellitus 1, gastroparesis, malnutrition status post J-tube placement, dilated cardiomyopathy, normocytic anemia, history of DVT not on anticoagulation at this time, CKD stage IIIb, hypertension, hypothyroid.   Hospital course / significant events: 01/25: EMS was called because patient was found unresponsive.  Per documentation, blood glucose with EMS was 27 mg/dL.  She was given 1 amp of glucagon  and blood glucose improved to 44 mg/dL.  In the ED, patient remained unresponsive, hypothermic, hypoxic on 3 L nasal cannula with pupils dilated.  An IO was placed in her right leg.  Patient was given 1 amp of D50.  Her blood glucose improved to 252 and despite increase in blood glucose, she remained unresponsive and hypoxic and was emergently intubated.  01/26: developed DKA, required insulin  gtt.  Patient remains mechanically intubated on minimal vent settings.   1/27: transition off insulin  gtt.  MRI brain was negative (obtained MRI due to continued acute encephalopathy and possible meningitis).  Patient was continued on ceftriaxone . Vancomycin / acyclovir  was added pending LP. 01/28: remains intubated due to encephalopathy.  Hemodynamically stable, not requiring vasopressor.  LP was inconclusive.  ID consulted.  Solu-Cortef  was decreased. 01/29: remains encephalopathic, precluding extubation.  ID recommended DC vancomycin .  Steroids continue to be de-escalated d/t HTN.  Neurology was consulted and recommended Ceribell for 24 hours and addressing metabolic issues. 01/30: Hypoglycemia to the 50s, Lantus  was decreased. 01/31:  Mentation continues to preclude extubation.  Patient is currently on Ceribell.  MRI brain wo contrast: Read as  no acute intracranial findings. Bilateral sphenoid sinus air-fluid levels, can be seen in acute sinusitis but is most likely related to intubated status.  02/01: Patient mentation improved.  Patient extubated. 02/02: mental status improved. Patient remains nonverbal and not following commands.  Neurology recommends holding TCA and Depakote .  Can confirm with patient family if patient is taking and can be restarted gradually.  Patient transferred to [facility] service. 02/03:  concern for selective mutism.  Patient is following with her eyes.  Patient opens and closes eyes on her own. [facility] consult ordered.  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, catatonia Plan trial benzo  Psychaitry following May need to involve neurology if tiral benzos not helpful    Anemia in chronic kidney disease (CKD) CKD stage 3b, GFR 30-44 ml/min Stable   Failure to thrive in adult Feeding tube in place   Type 1 diabetic Brittle diabetes Basal + sliding scale q4h while on tube feeds   Dyslipidemia Home rosuvastatin  10 mg daily per NG tube   Gastro-esophageal reflux disease with esophagitis Protonix  40 mg IV every 12 hours   Gastroenteritis Feeding tube   Hypothyroidism Levothyroxine  75 mcg per tube daily   Adrenal insufficiency (Addison's disease)  Solu-Cortef  50 mg IV twice daily   Essential hypertension Losartan  50 mg daily per tube, metoprolol  tartrate 12.5  mg p.o. twice daily were resumed Hydralazine  10 to 20 mg IV every 4 hours as needed for SBP greater than 170 Labetalol   5 mg IV every 2 hours as needed for SBP greater than 170 if refractory to hydralazine       overweight based on BMI: Body mass index is 25.05 kg/m.SABRA Significantly low or high BMI is associated with higher medical risk.  Underweight - under 18  overweight - 25 to 29 obese - 30 or more Class 1 obesity: BMI of 30.0 to 34 Class 2 obesity: BMI of 35.0 to 39 Class 3 obesity: BMI of 40.0 to 49 Super Morbid Obesity: BMI 50-59 Super-super Morbid Obesity: BMI 60+ Healthy nutrition and physical activity advised as adjunct to other disease management and risk reduction treatments    DVT prophylaxis: heparin  IV fluids: no continuous IV fluids  Nutrition: tube feeds Central lines / other devices: NG tube in place  Code Status: FULL CODE ACP documentation reviewed: none on file in [facility]  TOC needs: TBD Medical barriers to dispo at this time: mental status. Expected readiness for discharge per TOC at this time:               Subjective / Brief ROS:  Patient not contributory   Family Communication: none at this time     Objective Findings:  Vitals:   06/27/24 0500 06/27/24 0619 06/27/24 0839 06/27/24 1300  BP:  (!) 164/75 (!) 164/66 (!) 156/67  Pulse:  72 66 68  Resp:  17 16 16   Temp:  98.6 F (37 C) 98.5 F (36.9 C) 98.7 F (37.1 C)  TempSrc:      SpO2:  98% 98% 97%  Weight: 60.1 kg     Height:        Intake/Output Summary (Last 24 hours) at 06/27/2024 1604 Last data filed at 06/27/2024 1242 Gross per 24 hour  Intake 700 ml  Output 800 ml  Net -100 ml   Filed Weights   06/25/24 0500 06/26/24 0500 06/27/24 0500  Weight: 66.7 kg 64.9 kg 60.1 kg    Examination:  Physical Exam Constitutional:      General: She is not in acute distress. Cardiovascular:     Rate and Rhythm: Normal rate and regular rhythm.  Pulmonary:     Effort: Pulmonary effort is normal. No respiratory distress.     Breath sounds: Normal breath sounds.  Abdominal:     General: Bowel  sounds are normal.     Palpations: Abdomen is soft.  Skin:    General: Skin is warm and dry.  Neurological:     Mental Status: She is alert.     Comments: Tracks with eyes but will not follow commands. Per RN, pt will say ouch or no when staff try to reposition her           Scheduled Medications:   Chlorhexidine  Gluconate Cloth  6 each Topical Daily   free water   100 mL Per Tube Q4H   heparin   5,000 Units Subcutaneous Q8H   hydrocortisone  sod succinate (SOLU-CORTEF ) inj  50 mg Intravenous Q12H   insulin  aspart  0-6 Units Subcutaneous Q4H   insulin  aspart  2 Units Subcutaneous Q4H   [START ON 06/28/2024] insulin  glargine-yfgn  4 Units Subcutaneous Daily   levothyroxine   75 mcg Per NG tube Q0600   losartan   50 mg Per Tube Daily   metoprolol  tartrate  12.5 mg Per Tube BID   pantoprazole  (PROTONIX ) IV  40 mg Intravenous  Q12H   rosuvastatin   10 mg Per NG tube Daily    Continuous Infusions:  feeding supplement (GLUCERNA 1.5 CAL) 1,000 mL (06/27/24 0432)    PRN Medications:  acetaminophen , albuterol , dextrose , hydrALAZINE , labetalol , mouth rinse, polyethylene glycol, senna  Antimicrobials from admission:  Anti-infectives (From admission, onward)    Start     Dose/Rate Route Frequency Ordered Stop   06/22/24 1000  cefTRIAXone  (ROCEPHIN ) 2 g in sodium chloride  0.9 % 100 mL IVPB  Status:  Discontinued        2 g 200 mL/hr over 30 Minutes Intravenous Every 24 hours 06/21/24 1029 06/21/24 1030   06/22/24 1000  cefTRIAXone  (ROCEPHIN ) 2 g in sodium chloride  0.9 % 100 mL IVPB        2 g 200 mL/hr over 30 Minutes Intravenous Every 24 hours 06/21/24 1030 06/22/24 1900   06/19/24 1000  cefTRIAXone  (ROCEPHIN ) 2 g in sodium chloride  0.9 % 100 mL IVPB  Status:  Discontinued        2 g 200 mL/hr over 30 Minutes Intravenous Every 12 hours 06/19/24 0008 06/21/24 1029   06/19/24 0130  acyclovir  (ZOVIRAX ) 550 mg in dextrose  5 % 100 mL IVPB  Status:  Discontinued        10 mg/kg  55.2  kg 111 mL/hr over 60 Minutes Intravenous Every 12 hours 06/19/24 0039 06/20/24 0949   06/19/24 0130  vancomycin  (VANCOREADY) IVPB 750 mg/150 mL  Status:  Discontinued        750 mg 150 mL/hr over 60 Minutes Intravenous Every 24 hours 06/19/24 0042 06/21/24 1029   06/19/24 0115  cefTRIAXone  (ROCEPHIN ) 1 g in sodium chloride  0.9 % 100 mL IVPB        1 g 200 mL/hr over 30 Minutes Intravenous  Once 06/19/24 0018 06/19/24 0127   06/17/24 1700  cefTRIAXone  (ROCEPHIN ) 1 g in sodium chloride  0.9 % 100 mL IVPB  Status:  Discontinued        1 g 200 mL/hr over 30 Minutes Intravenous Every 24 hours 06/17/24 1558 06/19/24 0008   06/17/24 1600  azithromycin  (ZITHROMAX ) tablet 500 mg        500 mg Per Tube Daily 06/17/24 1558 06/22/24 0931   06/17/24 1600  vancomycin  (VANCOCIN ) IVPB 1000 mg/200 mL premix  Status:  Discontinued        1,000 mg 200 mL/hr over 60 Minutes Intravenous  Once 06/17/24 1558 06/17/24 1952           Data Reviewed:  I have personally reviewed the following...  CBC: Recent Labs  Lab 06/22/24 0302 06/23/24 0358 06/24/24 0402 06/25/24 0555 06/26/24 0302  WBC 7.8 9.9 11.4* 8.0 6.9  HGB 8.2* 8.7* 8.7* 8.7* 9.0*  HCT 25.9* 27.5* 27.1* 26.6* 27.8*  MCV 95.6 95.2 92.8 91.4 94.2  PLT 119* 107* 136* 172 210   Basic Metabolic Panel: Recent Labs  Lab 06/21/24 0432 06/21/24 1814 06/22/24 0302 06/23/24 0358 06/24/24 0402 06/25/24 0555 06/26/24 0302  NA 149*   < > 154* 145 139 144 148*  K 2.5*   < > 3.4* 4.9 4.6 4.7 4.2  CL 116*   < > 127* 115* 109 113* 114*  CO2 20*   < > 19* 21* 19* 20* 22  GLUCOSE 201*   < > 83 168* 145* 117* 154*  BUN 33*   < > 32* 27* 37* 35* 35*  CREATININE 1.76*   < > 1.68* 1.53* 1.50* 1.48* 1.55*  CALCIUM  7.5*   < >  7.1* 7.7* 8.0* 8.2* 8.5*  MG 2.2  --  2.0 1.8 2.5* 2.3 2.2  PHOS 3.1  --  1.9* 3.7  --   --  3.5   < > = values in this interval not displayed.   GFR: Estimated Creatinine Clearance: 42.9 mL/min (A) (by C-G formula based on  SCr of 1.55 mg/dL (H)). Liver Function Tests: Recent Labs  Lab 06/22/24 1537  AST 23  ALT 14  ALKPHOS 56  BILITOT <0.2  PROT 4.9*  ALBUMIN  2.8*   No results for input(s): LIPASE, AMYLASE in the last 168 hours. No results for input(s): AMMONIA in the last 168 hours. Coagulation Profile: No results for input(s): INR, PROTIME in the last 168 hours. Cardiac Enzymes: No results for input(s): CKTOTAL, CKMB, CKMBINDEX, TROPONINI in the last 168 hours. BNP (last 3 results) Recent Labs    12/21/23 1438  PROBNP >70000*   HbA1C: No results for input(s): HGBA1C in the last 72 hours. CBG: Recent Labs  Lab 06/27/24 0110 06/27/24 0600 06/27/24 0841 06/27/24 1159 06/27/24 1235  GLUCAP 244* 251* 230* 166* 174*   Lipid Profile: No results for input(s): CHOL, HDL, LDLCALC, TRIG, CHOLHDL, LDLDIRECT in the last 72 hours. Thyroid  Function Tests: No results for input(s): TSH, T4TOTAL, FREET4, T3FREE, THYROIDAB in the last 72 hours. Anemia Panel: No results for input(s): VITAMINB12, FOLATE, FERRITIN, TIBC, IRON , RETICCTPCT in the last 72 hours. Most Recent Urinalysis On File:     Component Value Date/Time   COLORURINE YELLOW (A) 06/17/2024 1538   APPEARANCEUR HAZY (A) 06/17/2024 1538   APPEARANCEUR Clear 12/21/2023 1438   LABSPEC 1.010 06/17/2024 1538   LABSPEC 1.026 03/01/2013 1844   PHURINE 5.0 06/17/2024 1538   GLUCOSEU >=500 (A) 06/17/2024 1538   GLUCOSEU >=500 03/01/2013 1844   HGBUR SMALL (A) 06/17/2024 1538   BILIRUBINUR NEGATIVE 06/17/2024 1538   BILIRUBINUR Negative 12/21/2023 1438   BILIRUBINUR Negative 03/01/2013 1844   KETONESUR NEGATIVE 06/17/2024 1538   PROTEINUR 100 (A) 06/17/2024 1538   UROBILINOGEN 0.2 10/23/2020 0953   NITRITE NEGATIVE 06/17/2024 1538   LEUKOCYTESUR SMALL (A) 06/17/2024 1538   LEUKOCYTESUR Negative 03/01/2013 1844   Sepsis  Labs: @LABRCNTIP (procalcitonin:4,lacticidven:4) Microbiology: Recent Results (from the past 240 hours)  Respiratory (~20 pathogens) panel by PCR     Status: None   Collection Time: 06/17/24  6:20 PM   Specimen: Nasopharyngeal Swab; Respiratory  Result Value Ref Range Status   Adenovirus NOT DETECTED NOT DETECTED Final   Coronavirus 229E NOT DETECTED NOT DETECTED Final    Comment: (NOTE) The Coronavirus on the Respiratory Panel, DOES NOT test for the novel  Coronavirus (2019 nCoV)    Coronavirus HKU1 NOT DETECTED NOT DETECTED Final   Coronavirus NL63 NOT DETECTED NOT DETECTED Final   Coronavirus OC43 NOT DETECTED NOT DETECTED Final   Metapneumovirus NOT DETECTED NOT DETECTED Final   Rhinovirus / Enterovirus NOT DETECTED NOT DETECTED Final   Influenza A NOT DETECTED NOT DETECTED Final   Influenza B NOT DETECTED NOT DETECTED Final   Parainfluenza Virus 1 NOT DETECTED NOT DETECTED Final   Parainfluenza Virus 2 NOT DETECTED NOT DETECTED Final   Parainfluenza Virus 3 NOT DETECTED NOT DETECTED Final   Parainfluenza Virus 4 NOT DETECTED NOT DETECTED Final   Respiratory Syncytial Virus NOT DETECTED NOT DETECTED Final   Bordetella pertussis NOT DETECTED NOT DETECTED Final   Bordetella Parapertussis NOT DETECTED NOT DETECTED Final   Chlamydophila pneumoniae NOT DETECTED NOT DETECTED Final   Mycoplasma pneumoniae NOT DETECTED  NOT DETECTED Final    Comment: Performed at [facility], [address] [place], [place] [address]  Blood culture (routine x 2)     Status: None   Collection Time: 06/17/24  7:29 PM   Specimen: BLOOD  Result Value Ref Range Status   Specimen Description BLOOD Pic Line  Final   Special Requests   Final    BOTTLES DRAWN AEROBIC ONLY Blood Culture adequate volume   Culture   Final    NO GROWTH 5 DAYS Performed at [facility], [address] [place], [place] [address]    Report Status 06/22/2024 FINAL  Final  SARS Coronavirus 2 by RT PCR (hospital  order, performed in [facility] hospital lab) *cepheid single result test* Anterior Nasal Swab     Status: None   Collection Time: 06/18/24  8:15 PM   Specimen: Anterior Nasal Swab  Result Value Ref Range Status   SARS Coronavirus 2 by RT PCR NEGATIVE NEGATIVE Final    Comment: (NOTE) SARS-CoV-2 target nucleic acids are NOT DETECTED.  The SARS-CoV-2 RNA is generally detectable in upper and lower respiratory specimens during the acute phase of infection. The lowest concentration of SARS-CoV-2 viral copies this assay can detect is 250 copies / mL. A negative result does not preclude SARS-CoV-2 infection and should not be used as the sole basis for treatment or other patient management decisions.  A negative result may occur with improper specimen collection / handling, submission of specimen other than nasopharyngeal swab, presence of viral mutation(s) within the areas targeted by this assay, and inadequate number of viral copies (<250 copies / mL). A negative result must be combined with clinical observations, patient history, and epidemiological information.  Fact Sheet for Patients:   roadlaptop.co.za  Fact Sheet for Healthcare Providers: http://kim-miller.com/  This test is not yet approved or  cleared by the [place]  FDA and has been authorized for detection and/or diagnosis of SARS-CoV-2 by FDA under an Emergency Use Authorization (EUA).  This EUA will remain in effect (meaning this test can be used) for the duration of the COVID-19 declaration under Section 564(b)(1) of the Act, 21 U.S.C. section 360bbb-3(b)(1), unless the authorization is terminated or revoked sooner.  Performed at [facility], [address] [place], [place] [address]   CSF culture w Gram Stain     Status: None   Collection Time: 06/19/24  2:33 PM   Specimen: PATH Cytology CSF; Cerebrospinal Fluid  Result Value Ref Range Status   Specimen  Description   Final    CSF Performed at [facility], [address], [place], [place] [address]    Special Requests   Final    NONE Performed at [facility], [address] [place], [place] [address]    Gram Stain   Final    NO ORGANISMS SEEN WBC SEEN RED BLOOD CELLS PRESENT Performed at [facility], [address] [place], [place] [address]    Culture   Final    NO GROWTH 3 DAYS Performed at [facility], [address], [place], [place] [address]    Report Status 06/22/2024 FINAL  Final  Culture, Fungus without Smear     Status: None (Preliminary result)   Collection Time: 06/19/24  2:33 PM   Specimen: PATH Cytology CSF; Cerebrospinal Fluid  Result Value Ref Range Status   Specimen Description   Final    CSF Performed at [facility], [address] [place], [place] [address]    Special Requests   Final    NONE Performed at [facility], [address] [place], [place] [address]    Culture   Final    NO FUNGUS ISOLATED AFTER 8 DAYS Performed at [facility], [address] [place], [place] [address]    Report Status PENDING  Incomplete  Anaerobic culture w Gram Stain     Status: None   Collection Time: 06/19/24  2:33 PM   Specimen: PATH Cytology CSF; Cerebrospinal Fluid  Result Value Ref Range Status   Specimen Description   Final    CSF Performed at [facility], [address] [place], [place] [address]    Special Requests   Final    NONE Performed at [facility], [address], [place], [place] [address]    Culture   Final    NO ANAEROBES ISOLATED Performed at [facility], [address], [place], [place] [address]    Report Status 06/24/2024 FINAL  Final      Radiology Studies last 3 days: DG Chest Port 1 View Result Date: 06/26/2024 CLINICAL DATA:  Left lower lobe consolidation EXAM: PORTABLE CHEST 1 VIEW COMPARISON:  Abdominal radiograph dated  06/25/2024, chest radiograph dated 06/17/2024 FINDINGS: Lines/tubes: Enteric tube tip reaches the diaphragm and terminates below the field of view. Lungs: Medial lung apices are obscured by the overlying mandible. Persistent left retrocardiac consolidation and patchy bilateral lower lung opacity, left greater than right. Pleura: No pneumothorax or pleural effusion. Heart/mediastinum: The heart size and mediastinal contours are within normal limits. Bones: No acute osseous abnormality. IMPRESSION: Persistent left retrocardiac consolidation and patchy bilateral lower lobe opacities, which may represent atelectasis, aspiration, or pneumonia. Electronically Signed   By: Limin  Xu M.D.   On: 06/26/2024 10:15   DG Abd 1 View Result Date: 06/25/2024 EXAM: 1 VIEW XRAY OF THE ABDOMEN 06/25/2024 09:51:38 PM COMPARISON: Portable abdomen film 06/23/2024. CLINICAL HISTORY: Encounter for nasogastric (NG) tube placement. ICD10: Z46.59 Encounter for fitting and adjustment of other gastrointestinal appliance and device (nasogastric (NG) tube placement). FINDINGS: LINES, TUBES AND DEVICES: NG tube has been removed and replaced with a dobbhoff feeding tube. The radiopaque tip is either in the antropyloric stomach or bulb of the duodenum, terminating to the right in the subhepatic area. BOWEL: Nonobstructive bowel gas pattern. SOFT TISSUES: No abnormal calcifications. The pelvis was not included in this exam. Limited exam for free air with no obvious free air being seen. BONES: No acute fracture. LUNGS AND PLEURA: There is a small left pleural effusion with increasing patchy consolidation in the left lower lobe. IMPRESSION: 1. Dobbhoff feeding tube with the radiopaque tip in the antropyloric stomach or bulb of duodenum, terminating to the right in the subhepatic area. 2. Small left pleural effusion with increasing patchy consolidation in the left lower lobe. Electronically signed by: Francis Quam MD 06/25/2024 10:14 PM EST RP  Workstation: [id number]       Time spent: 50 min     Laneta Blunt, DO [facility] 06/27/2024, 4:04 PM    Dictation software may have been used to generate the above note. Typos may occur and escape review in typed/dictated notes. Please contact Dr Blunt directly for clarity if needed.  Staff may message me via secure chat in Epic  but this may not receive an immediate response,  please page me for urgent matters!  If 7PM-7AM, please contact night coverage [email or web address]       "

## 2024-06-27 NOTE — Inpatient Diabetes Management (Signed)
 Inpatient Diabetes Program Recommendations  AACE/ADA: New Consensus Statement on Inpatient Glycemic Control   Target Ranges:  Prepandial:   less than 140 mg/dL      Peak postprandial:   less than 180 mg/dL (1-2 hours)      Critically ill patients:  140 - 180 mg/dL    Latest Reference Range & Units 06/26/24 01:47 06/26/24 07:52 06/26/24 09:24 06/26/24 11:32 06/26/24 18:00 06/26/24 21:16 06/27/24 01:10 06/27/24 06:00  Glucose-Capillary 70 - 99 mg/dL 887 (H) 789 (H) 809 (H) 169 (H) 317 (H) 290 (H) 244 (H) 251 (H)   Review of Glycemic Control  Diabetes history: DM1  Outpatient Diabetes medications: Omnipod 5 insulin  pump with Novolog , Dexcom G7 CGM; off pump plan Lantus  11 units daily, Novolog  1 units per 20 grams of carbs, Novolog  1:70 (1 unit drops glucose 70 mg/dl) Current orders for Inpatient glycemic control: Semglee  4 units daily, Novolog  0-6 units Q4H; Solucortef 50 mg Q12H, Glucerna @ 50 ml/hr  Inpatient Diabetes Program Recommendations:    Insulin : Tube feeding was changed and rate increased and glucose trending higher as a result.   Please consider ordering Novolog  3 units Q4H for tube feeding coverage. If tube feeding is stopped or held then Novolog  tube feeding coverage should also be stopped or held.  Thanks, Earnie Gainer, RN, MSN, CDCES Diabetes Coordinator Inpatient Diabetes Program [phone] (Team Pager from 8am to 5pm)

## 2024-06-27 NOTE — Consult Note (Signed)
 [facility] Psychiatric Consult Initial  Patient Name: .Ashley Camacho  MRN: [id number]  DOB: 1991-04-18  Consult Order details:  Orders (From admission, onward)     Start     Ordered   06/26/24 1128  IP CONSULT TO PSYCHIATRY       Ordering Provider: Sherre Greig SAILOR, DO  Provider:  (Not yet assigned)  Question Answer Comment  Location [facility]   Reason for Consult? selective mutism?      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 female admitted medically.    Assessment: Catatonia, rule out. Concern due to mutism, stupor/decreased responsiveness, negativism (refusal of interventions, moving away from examiner).  Plan:  Trial of IV lorazepam  challenge test  Plan discussed with MD Marsa. Will reassess following lorazepam  trial.  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)   History of DVT (deep vein thrombosis)   Depression, recurrent   ABLA (acute blood loss anemia)   Dyslipidemia   Psychiatric disturbance    Plan   ## Disposition: Patient is not a candidate for inpatient psychiatric admission as symptoms at this time do not appear  primarily psychiatric in origin- will continue to round on patient during medical hospitalization and assist medical team with medications management  ## Psychiatric Medication Recommendations: Trial of IV ativan  2 mg once and assess patient response to intervention   ## Medical Decision Making Capacity: Not specifically addressed in this encounter  ## Further Work-up:  EKG- QTC: 507 on 06/17/24 Labs: cbc, cmp  ## Behavioral / Environmental: -Delirium Precautions: Delirium Interventions for Nursing and Staff: - RN to open blinds every AM. - To Bedside: Glasses, hearing aide, and pt's own shoes. Make available to patients. when possible and encourage use. - Encourage po fluids when appropriate, keep fluids within reach. - OOB to chair with meals. - Passive ROM exercises to all extremities with AM & PM care. - RN to assess orientation to person, time and place QAM and PRN. - Recommend extended visitation hours with familiar family/friends as feasible. - Staff to minimize disturbances at night. Turn off television when pt asleep or when not in use.    ## Safety and Observation Level:  - Based on my clinical evaluation, I estimate the patient to be at low risk of self harm in the current setting. - At this time, we recommend  routine. This decision is based on my review of the chart including patient's history and current presentation, interview of the patient, mental status examination, and consideration of suicide risk including evaluating suicidal ideation, plan, intent, suicidal or self-harm behaviors, risk factors, and protective factors.  This judgment is based on our ability to directly address suicide risk, implement suicide prevention strategies, and develop a safety plan while the patient is in the clinical setting. Please contact our team if there is a concern that risk level has changed.  CSSR Risk Category:C-SSRS RISK CATEGORY:  Garment/textile Technologist Visit from 06/13/2024 in [facility] Of [facility] ED to Hosp-Admission (Discharged) from 05/27/2024 in [facility] ED to Hosp-Admission (Discharged) from 03/26/2024 in [facility] (1A)  C-SSRS RISK CATEGORY Error: Q3, 4, or 5 should not be populated when Q2 is No No Risk No Risk     Suicide Risk Assessment: Patient has following modifiable risk factors for suicide: pain, medical illness (ie new dx of cancer), which we are addressing by assisting medical team with medication management.  Patient has following non-modifiable or demographic risk factors for suicide: N/A  Patient has the following protective factors against suicide: Supportive family and no history of suicide attempts  Thank you for this consult request. Recommendations have been communicated to the primary team.  We will continue to round at this time.       History of Present Illness  Relevant Aspects of Hospital Hospital   Patient Report:   SUBJECTIVE/OBJECTIVE Psychiatry consulted by primary medical team for concern of potential selective mutism in patient on medical floor.  Mental Status Exam: Patient had eyes open, looking at examiner, but demonstrated profound unresponsiveness. No verbal responses elicited despite multiple attempts, only single verbal response was ow when this provider attempted to touch patient's arm for assessment. When asked to wiggle toes and feet tapped, patient moved feet away from provider. Per primary medical team report, patient states no and refuses nursing interventions.  Collateral from Mother: No history of similar presentation. Patient had been intubated following cardiac arrest a couple of years ago but did not present like this at that time. Mother denies any known psychiatric history.   Psychiatric and Social History  Psychiatric History:  Information collected from Chart/mother contributed to most due to patient limited  participation during today's exam  Prev Dx/Sx: Mother reported none that she knew of Current Psych Provider: Mother denied Home Meds (current): Mother unsure Previous Med Trials:UTA  Therapy: UTA Prior Psych Hospitalization: Mother denied Prior Suicide attempt/Self Harm: Mother denied Prior Violence: Mother denied  Family Psych History: UTA Family Hx suicide: UTA  Social History:  Educational Hx: UTA Occupational Hx: Chief Financial Officer Hx: UTA Living Situation: UTA Spiritual Hx: UTA Access to weapons/lethal means: Family denied   Substance History Alcohol: UTA Last Drink :UTA Number of drinks per day : UTA History of alcohol withdrawal seizures: UTA History of DT's:UTA Tobacco: UTA Illicit drugs: UTA Prescription drug abuse: UTA Rehab hx: UTA  Exam Findings  Physical Exam: Reviewed and agree with the physical exam findings conducted by the medical provider Vital Signs:  Temp:  [98.3 F (36.8 C)-98.6 F (37 C)] 98.5 F (36.9 C) (02/04 0839) Pulse Rate:  [66-91] 66 (02/04 0839) Resp:  [16-19] 16 (02/04 0839) BP: (135-168)/(66-97) 164/66 (02/04 0839) SpO2:  [98 %-100 %] 98 % (02/04 0839) Weight:  [60.1 kg] 60.1 kg (02/04 0500) Blood pressure (!) 164/66, pulse 66, temperature 98.5 F (36.9 C), resp. rate 16, height 5' 0.98 (1.549 m), weight 60.1 kg, SpO2 98%. Body mass index is 25.05 kg/m.    Mental Status Exam: General Appearance: Fairly Groomed  Orientation:  Other:  UTA  Memory:  UTA  Concentration:  Concentration: Poor  Recall:  Poor  Attention  Poor  Eye Contact:  Fair  Speech:  Negative  Language:  Negative  Volume:  UTA  Mood: Anxious  Affect:  Congruent  Thought Process:  UTA  Thought Content:  UTA  Suicidal Thoughts:  UTA  Homicidal Thoughts:  UTA  Judgement:  Other:  UTA  Insight:  UTA  Psychomotor Activity:  Psychomotor Retardation  Akathisia:  UTA  Fund of Knowledge:  UTA      Assets:  Social Support  Cognition:  Impaired,  Severe  ADL's:   Impaired  AIMS (if indicated):        Other History   These have been pulled in through the EMR, reviewed, and updated if appropriate.  Family History:  The patient's family history includes ADD / ADHD in her daughter; Breast cancer in her mother; CAD in her father, paternal grandfather, and paternal grandmother; Cancer in her mother; Diabetes in her father, maternal uncle, paternal grandfather, and paternal grandmother; Diabetes type I in her father; Early death in her father; Lung cancer in her maternal grandfather; Seizures in her mother.  Medical History: Past Medical History:  Diagnosis Date   Acute metabolic encephalopathy 06/10/2022   Adrenal insufficiency    Anemia    Anxiety    Blood transfusion without reported diagnosis    CHF (congestive heart failure) (HCC)    Chronic kidney disease    Depression    Gastroparesis    Hypertension    Hypotension    Intractable nausea and vomiting 11/13/2022   Migraine    Thyroid  disease    Type 1 diabetes [facility])     Surgical History: Past Surgical History:  Procedure Laterality Date   BIOPSY  01/14/2021   Procedure: BIOPSY;  Surgeon: Eda Iha, MD;  Location: [facility] ENDOSCOPY;  Service: Gastroenterology;;   CENTRAL LINE INSERTION N/A 10/10/2023   Procedure: CENTRAL LINE INSERTION;  Surgeon: Marea Selinda RAMAN, MD;  Location: [facility] INVASIVE CV LAB;  Service: Cardiovascular;  Laterality: N/A;   CESAREAN SECTION     x2   CESAREAN SECTION WITH BILATERAL TUBAL LIGATION N/A 02/17/2021   Procedure: CESAREAN SECTION WITH BILATERAL TUBAL LIGATION;  Surgeon: Barbra Lang PARAS, DO;  Location: [facility] LD ORS;  Service: Obstetrics;  Laterality: N/A;   ESOPHAGOGASTRODUODENOSCOPY (EGD) WITH PROPOFOL  N/A 01/14/2021   Procedure: ESOPHAGOGASTRODUODENOSCOPY (EGD) WITH PROPOFOL ;  Surgeon: Eda Iha, MD;  Location: [facility] ENDOSCOPY;  Service: Gastroenterology;  Laterality: N/A;   IR BONE MARROW BIOPSY & ASPIRATION  12/16/2022   IR REPLC DUODEN/JEJUNO TUBE  PERCUT W/FLUORO  11/22/2022   JEJUNOSTOMY N/A 06/13/2022   Procedure: DEXTER;  Surgeon: Jordis Laneta FALCON, MD;  Location: [facility] ORS;  Service: General;  Laterality: N/A;   MOUTH SURGERY     TEE WITHOUT CARDIOVERSION N/A 01/06/2024   Procedure: ECHOCARDIOGRAM, TRANSESOPHAGEAL;  Surgeon: Rolan Ezra RAMAN, MD;  Location: [facility] ORS;  Service: Cardiovascular;  Laterality: N/A;   TUBAL LIGATION       Medications:  Current Medications[1]  Allergies: Allergies[2]  A. Claudene, NP \\This  note was created using Scientist, clinical (histocompatibility and immunogenetics). Please excuse any inadvertent transcription errors. Case was discussed with supervising physician Dr. Jadapalle who is agreeable with current plan.       [1]  Current Facility-Administered Medications:    acetaminophen  (TYLENOL ) tablet 1,000 mg, 1,000 mg, Per NG tube, Q6H PRN, Tukov-Yual, Magdalene S, NP   albuterol  (PROVENTIL ) (2.5 MG/3ML) 0.083% nebulizer solution 2.5 mg, 2.5  mg, Nebulization, Q6H PRN, Tukov-Yual, Magdalene S, NP   Chlorhexidine  Gluconate Cloth 2 % PADS 6 each, 6 each, Topical, Daily, Dgayli, Khabib, MD, 6 each at 06/26/24 2200   dextrose  50 % solution 0-50 mL, 0-50 mL, Intravenous, PRN, Ouma, Elizabeth Achieng, NP, 50 mL at 06/23/24 0324   feeding supplement (GLUCERNA 1.5 CAL) liquid 1,000 mL, 1,000 mL, Per Tube, Continuous, Cox, Amy N, DO, Last Rate: 50 mL/hr at 06/27/24 0432, 1,000 mL at 06/27/24 0432   free water  100 mL, 100 mL, Per Tube, Q4H, Rust-Chester, Britton L, NP, 100 mL at 06/27/24 0909   heparin  injection 5,000 Units, 5,000 Units, Subcutaneous, Q8H, Tukov-Yual, Magdalene S, NP, 5,000 Units at 06/27/24 0606   hydrALAZINE  (APRESOLINE ) injection 10-20 mg, 10-20 mg, Intravenous, Q4H PRN, Bousman, Karlie, PA-C, 10 mg at 06/26/24 0022   hydrocortisone  sodium succinate  (SOLU-CORTEF ) 100 MG injection 50 mg, 50 mg, Intravenous, Q12H, Dgayli, Khabib, MD, 50 mg at 06/27/24 0916   insulin  aspart (novoLOG ) injection 0-6 Units, 0-6 Units,  Subcutaneous, Q4H, Cox, Amy N, DO, 2 Units at 06/27/24 0907   insulin  aspart (novoLOG ) injection 3 Units, 3 Units, Subcutaneous, Q4H, Alexander, Natalie, DO   insulin  glargine-yfgn injection 4 Units, 4 Units, Subcutaneous, Daily, Assaker, Jean-Pierre, MD, 4 Units at 06/27/24 9083   labetalol  (NORMODYNE ) injection 5 mg, 5 mg, Intravenous, Q2H PRN, Cox, Amy N, DO   levothyroxine  (SYNTHROID ) tablet 75 mcg, 75 mcg, Per NG tube, Q0600, Tukov-Yual, Magdalene S, NP, 75 mcg at 06/27/24 0606   losartan  (COZAAR ) tablet 50 mg, 50 mg, Per Tube, Daily, Clair Marolyn NOVAK, RPH, 50 mg at 06/27/24 0908   metoprolol  tartrate (LOPRESSOR ) tablet 12.5 mg, 12.5 mg, Per Tube, BID, Clair Marolyn NOVAK, RPH, 12.5 mg at 06/27/24 0908   Oral care mouth rinse, 15 mL, Mouth Rinse, PRN, Assaker, Darrin, MD   pantoprazole  (PROTONIX ) injection 40 mg, 40 mg, Intravenous, Q12H, Rust-Chester, Britton L, NP, 40 mg at 06/27/24 0908   polyethylene glycol (MIRALAX  / GLYCOLAX ) packet 17 g, 17 g, Per Tube, Daily PRN, Tukov-Yual, Magdalene S, NP   rosuvastatin  (CRESTOR ) tablet 10 mg, 10 mg, Per NG tube, Daily, Tukov-Yual, Magdalene S, NP, 10 mg at 06/27/24 0908   senna (SENOKOT) tablet 8.6 mg, 1 tablet, Per Tube, BID PRN, Tukov-Yual, Magdalene S, NP [2] No Known Allergies

## 2024-06-28 ENCOUNTER — Inpatient Hospital Stay

## 2024-06-28 LAB — BASIC METABOLIC PANEL WITH GFR
Anion gap: 11 (ref 5–15)
BUN: 44 mg/dL — ABNORMAL HIGH (ref 6–20)
CO2: 23 mmol/L (ref 22–32)
Calcium: 8.4 mg/dL — ABNORMAL LOW (ref 8.9–10.3)
Chloride: 119 mmol/L — ABNORMAL HIGH (ref 98–111)
Creatinine, Ser: 1.51 mg/dL — ABNORMAL HIGH (ref 0.44–1.00)
GFR, Estimated: 46 mL/min — ABNORMAL LOW
Glucose, Bld: 252 mg/dL — ABNORMAL HIGH (ref 70–99)
Potassium: 3.6 mmol/L (ref 3.5–5.1)
Sodium: 153 mmol/L — ABNORMAL HIGH (ref 135–145)

## 2024-06-28 LAB — GLUCOSE, CAPILLARY
Glucose-Capillary: 123 mg/dL — ABNORMAL HIGH (ref 70–99)
Glucose-Capillary: 149 mg/dL — ABNORMAL HIGH (ref 70–99)
Glucose-Capillary: 169 mg/dL — ABNORMAL HIGH (ref 70–99)
Glucose-Capillary: 207 mg/dL — ABNORMAL HIGH (ref 70–99)
Glucose-Capillary: 226 mg/dL — ABNORMAL HIGH (ref 70–99)
Glucose-Capillary: 228 mg/dL — ABNORMAL HIGH (ref 70–99)

## 2024-06-28 LAB — CBC
HCT: 26.7 % — ABNORMAL LOW (ref 36.0–46.0)
Hemoglobin: 8.5 g/dL — ABNORMAL LOW (ref 12.0–15.0)
MCH: 30.6 pg (ref 26.0–34.0)
MCHC: 31.8 g/dL (ref 30.0–36.0)
MCV: 96 fL (ref 80.0–100.0)
Platelets: 191 10*3/uL (ref 150–400)
RBC: 2.78 MIL/uL — ABNORMAL LOW (ref 3.87–5.11)
RDW: 13.1 % (ref 11.5–15.5)
WBC: 6.6 10*3/uL (ref 4.0–10.5)
nRBC: 0 % (ref 0.0–0.2)

## 2024-06-28 MED ORDER — GERHARDT'S BUTT CREAM
1.0000 | TOPICAL_CREAM | Freq: Every day | CUTANEOUS | Status: AC | PRN
  Administered 2024-06-28 – 2024-06-29 (×3): 1 via TOPICAL
  Filled 2024-06-28: qty 60

## 2024-06-28 NOTE — Progress Notes (Signed)
 Physical Therapy Treatment Patient Details Name: Ashley Camacho MRN: [id number] DOB: 1990/08/19 Today's Date: 06/28/2024   History of Present Illness Pt is a 34 y/o F admitted on 06/17/24 after presenting after being found unresponsive. Pt admitted for severe hypoglycemia & acute hypoxic respiratory failure, required intubation, extubated 06/24/24. PMH: DM1, CKA, CKD, HTN, gastroparesis, adrenal insufficiency    PT Comments  Patient with eyes open, intermittently makes eye contact but did not follow any commands. maxAx2 for bed mobility to roll and totalA for all pericare. totalAx2 to come up into sitting at EOB, once position in midline, able to maintain with CGA-supervision. totalA for ADLs as well. The patient would benefit from further skilled PT intervention to continue to progress towards goals as able.     If plan is discharge home, recommend the following: Two people to help with walking and/or transfers;Assistance with feeding;Assist for transportation;Direct supervision/assist for medications management;Two people to help with bathing/dressing/bathroom;Help with stairs or ramp for entrance;Assistance with cooking/housework;Direct supervision/assist for financial management;Supervision due to cognitive status   Can travel by private vehicle     No  Equipment Recommendations  Other (comment) (TBD)    Recommendations for Other Services       Precautions / Restrictions Precautions Precautions: Fall Recall of Precautions/Restrictions: Impaired Restrictions Weight Bearing Restrictions Per Provider Order: No     Mobility  Bed Mobility Overal bed mobility: Needs Assistance Bed Mobility: Supine to Sit, Sit to Supine, Rolling Rolling: Total assist, +2 for physical assistance   Supine to sit: +2 for safety/equipment, Total assist          Transfers                        Ambulation/Gait                   Stairs             Wheelchair Mobility      Tilt Bed    Modified Rankin (Stroke Patients Only)       Balance Overall balance assessment: Needs assistance Sitting-balance support: Feet supported Sitting balance-Leahy Scale: Fair Sitting balance - Comments: progressed to CGA once repositioned                                    Communication    Cognition Arousal: Lethargic Behavior During Therapy: Flat affect     Difficult to assess due to: Level of arousal                     PT - Cognition Comments: no communication attempts Following commands: Impaired Following commands impaired:  (pt did not follow any commands)    Cueing Cueing Techniques: Verbal cues, Gestural cues, Visual cues, Tactile cues  Exercises      General Comments        Pertinent Vitals/Pain Pain Assessment Pain Assessment: Faces Faces Pain Scale: Hurts a little bit Pain Location: some pain signs/symptoms with mobility/hair brushing Pain Descriptors / Indicators: Grimacing, Moaning Pain Intervention(s): Limited activity within patient's tolerance, Monitored during session, Repositioned    Home Living                          Prior Function            PT Goals (current goals can now be found in the care  plan section) Progress towards PT goals: Progressing toward goals    Frequency    Min 2X/week      PT Plan      Co-evaluation PT/OT/SLP Co-Evaluation/Treatment: Yes Reason for Co-Treatment: Complexity of the patient's impairments (multi-system involvement);Necessary to address cognition/behavior during functional activity;For patient/therapist safety;To address functional/ADL transfers PT goals addressed during session: Mobility/safety with mobility;Balance OT goals addressed during session: ADL's and self-care      AM-PAC PT 6 Clicks Mobility   Outcome Measure  Help needed turning from your back to your side while in a flat bed without using bedrails?: Total Help needed moving from  lying on your back to sitting on the side of a flat bed without using bedrails?: Total Help needed moving to and from a bed to a chair (including a wheelchair)?: Total Help needed standing up from a chair using your arms (e.g., wheelchair or bedside chair)?: Total Help needed to walk in hospital room?: Total Help needed climbing 3-5 steps with a railing? : Total 6 Click Score: 6    End of Session   Activity Tolerance:  (limited by cognition) Patient left: Other (comment) (seated with OT at EOB) Nurse Communication: Mobility status PT Visit Diagnosis: Muscle weakness (generalized) (M62.81);Other abnormalities of gait and mobility (R26.89);Difficulty in walking, not elsewhere classified (R26.2);Unsteadiness on feet (R26.81)     Time: 1040-1104 PT Time Calculation (min) (ACUTE ONLY): 24 min  Charges:    $Therapeutic Activity: 8-22 mins PT General Charges $$ ACUTE PT VISIT: 1 Visit                     Doyal Shams PT, DPT 12:49 PM,06/28/24

## 2024-06-28 NOTE — TOC Progression Note (Signed)
 Transition of Care [facility]) - Progression Note    Patient Details  Name: Ashley Camacho MRN: [id number] Date of Birth: 16-Jun-1990  [facility] CM/SW Contact  Grayce JAYSON Perfect, RN Phone Number: 06/28/2024, 2:45 PM  Clinical Narrative:   RNCM met with patient in her room.  She is sleeping with eyes closed, open them very briefly when she heard me , attempted to talk with her, no response noted and moved arm away from me when I touched her arm.  RNCM left message with her mother to discuss her case.  RNCM will continue to follow case until discharge.      Barriers to Discharge: Continued Medical Work up               Expected Discharge Plan and Services                                               Social Drivers of Health (SDOH) Interventions SDOH Screenings   Food Insecurity: Patient Unable To Answer (06/17/2024)  Recent Concern: Food Insecurity - Food Insecurity Present (05/30/2024)  Housing: Patient Unable To Answer (06/17/2024)  Recent Concern: Housing - High Risk (05/30/2024)  Transportation Needs: Patient Unable To Answer (06/17/2024)  Utilities: Patient Unable To Answer (06/17/2024)  Alcohol Screen: Low Risk (04/11/2024)  Depression (PHQ2-9): Medium Risk (06/13/2024)  Financial Resource Strain: Medium Risk (04/11/2024)  Physical Activity: Insufficiently Active (04/11/2024)  Social Connections: Socially Isolated (04/11/2024)  Stress: Stress Concern Present (04/11/2024)  Tobacco Use: Low Risk (06/26/2024)  Health Literacy: Adequate Health Literacy (12/21/2023)    Readmission Risk Interventions    06/18/2024   12:07 PM 01/04/2024   12:41 PM 11/15/2023    4:19 PM  Readmission Risk Prevention Plan  Transportation Screening  Complete Complete  Medication Review (RN Care Manager) Complete Complete Complete  PCP or Specialist appointment within 3-5 days of discharge Complete Complete Complete  HRI or Home Care Consult Complete    SW Recovery  Care/Counseling Consult Complete Complete Complete  Palliative Care Screening Not Applicable Not Applicable Not Applicable  Skilled Nursing Facility Not Applicable Not Applicable Not Applicable

## 2024-06-28 NOTE — Progress Notes (Signed)
 Occupational Therapy Treatment Patient Details Name: Ashley Camacho MRN: [id number] DOB: [birth year] Today's Date: 06/28/2024   History of present illness Pt is a 34 y/o F admitted on 06/17/24 after presenting after being found unresponsive. Pt admitted for severe hypoglycemia & acute hypoxic respiratory failure, required intubation, extubated 06/24/24. PMH: DM1, CKA, CKD, HTN, gastroparesis, adrenal insufficiency   OT comments  Chart reviewed to date, pt greeted in care of PT. Pt is noted to be incontinent of BM. Tube feeds paused throughout. Pt continues to present essentially non verbal with inconsistent command following. MAX-TOTAL A with step by step multi modal cues required for mobility and ADLs on this date. OT will continue to follow to facilitate optimal ADL/functional mobility engagement. Pt is left in care of nurse, all needs met.       If plan is discharge home, recommend the following:  A lot of help with walking and/or transfers;A lot of help with bathing/dressing/bathroom   Equipment Recommendations  Other (comment) (defer to next venue of care)    Recommendations for Other Services      Precautions / Restrictions Precautions Precautions: Fall Recall of Precautions/Restrictions: Impaired Restrictions Weight Bearing Restrictions Per Provider Order: No       Mobility Bed Mobility Overal bed mobility: Needs Assistance Bed Mobility: Supine to Sit, Sit to Supine, Rolling Rolling: Total assist, +2 for physical assistance   Supine to sit: +2 for safety/equipment, Total assist Sit to supine: Total assist, +2 for physical assistance, +2 for safety/equipment        Transfers Overall transfer level: Needs assistance Equipment used: 1 person hand held assist Transfers: Sit to/from Stand Sit to Stand: Total assist                 Balance Overall balance assessment: Needs assistance Sitting-balance support: Feet supported Sitting balance-Leahy Scale: Fair      Standing balance support: Bilateral upper extremity supported, During functional activity, Reliant on assistive device for balance Standing balance-Leahy Scale: Poor                             ADL either performed or assessed with clinical judgement   ADL Overall ADL's : Needs assistance/impaired     Grooming: Total assistance;Sitting;Wash/dry hands;Wash/dry face;Oral care Grooming Details (indicate cue type and reason): to remove knots from hair, pt grimacing/pulling away with attempts at oral care, Notified nurse for potential need for suction oral care kit Upper Body Bathing: Total assistance;Bed level   Lower Body Bathing: Total assistance;Bed level   Upper Body Dressing : Total assistance Upper Body Dressing Details (indicate cue type and reason): donn/doff gpwn Lower Body Dressing: Total assistance;Bed level       Toileting- Clothing Manipulation and Hygiene: Total assistance;+2 for physical assistance;+2 for safety/equipment;Bed level              Extremity/Trunk Assessment              Vision       Perception     Praxis     Communication Communication Factors Affecting Communication: Difficulty expressing self   Cognition Arousal: Lethargic Behavior During Therapy: Flat affect Cognition: Cognition impaired, Difficult to assess Difficult to assess due to: Impaired communication           OT - Cognition Comments: generally non verbal, significantly increased time for one step directions with inconsistent command following  Following commands: Impaired Following commands impaired:  (did not follow any commands)      Cueing   Cueing Techniques: Verbal cues, Gestural cues, Visual cues, Tactile cues  Exercises Other Exercises Other Exercises: edu re role of OT, role of rehab    Shoulder Instructions       General Comments vss    Pertinent Vitals/ Pain       Pain Assessment Pain Assessment:  PAINAD Breathing: normal Negative Vocalization: occasional moan/groan, low speech, negative/disapproving quality Facial Expression: facial grimacing Body Language: tense, distressed pacing, fidgeting Consolability: distracted or reassured by voice/touch PAINAD Score: 5 Pain Location: grimacing with attempts at oral care Pain Descriptors / Indicators: Grimacing, Moaning Pain Intervention(s): Monitored during session, Limited activity within patient's tolerance  Home Living                                          Prior Functioning/Environment              Frequency  Min 2X/week        Progress Toward Goals  OT Goals(current goals can now be found in the care plan section)  Progress towards OT goals: Progressing toward goals  Acute Rehab OT Goals OT Goal Formulation: Patient unable to participate in goal setting ADL Goals Pt Will Perform Grooming: with mod assist;sitting Pt Will Perform Lower Body Dressing: with mod assist;sit to/from stand;sitting/lateral leans Pt Will Transfer to Toilet: with mod assist Pt Will Perform Toileting - Clothing Manipulation and hygiene: with mod assist;sit to/from stand  Plan      Co-evaluation    PT/OT/SLP Co-Evaluation/Treatment: Yes Reason for Co-Treatment: Complexity of the patient's impairments (multi-system involvement);Necessary to address cognition/behavior during functional activity;For patient/therapist safety;To address functional/ADL transfers PT goals addressed during session: Mobility/safety with mobility;Balance OT goals addressed during session: ADL's and self-care      AM-PAC OT 6 Clicks Daily Activity     Outcome Measure   Help from another person eating meals?: Total Help from another person taking care of personal grooming?: Total Help from another person toileting, which includes using toliet, bedpan, or urinal?: Total Help from another person bathing (including washing, rinsing, drying)?:  Total Help from another person to put on and taking off regular upper body clothing?: Total Help from another person to put on and taking off regular lower body clothing?: Total 6 Click Score: 6    End of Session    OT Visit Diagnosis: Muscle weakness (generalized) (M62.81);Other abnormalities of gait and mobility (R26.89);Other symptoms and signs involving cognitive function   Activity Tolerance Patient limited by lethargy   Patient Left in bed;with call bell/phone within reach;with bed alarm set   Nurse Communication Mobility status        Time: 8954-8887 OT Time Calculation (min): 27 min  Charges: OT General Charges $OT Visit: 1 Visit OT Treatments $Self Care/Home Management : 8-22 mins  Therisa Sheffield, OTD OTR/L  06/28/24, 1:30 PM

## 2024-06-28 NOTE — Progress Notes (Signed)
 Nutrition Follow-up  DOCUMENTATION CODES:   Not applicable  INTERVENTION:   -RD will follow for diet advancement and add supplements as appropriate -TF via NGT:   Glucerna 1.5 @ 20 ml/hr and increase by 10 ml every 4 hours to goal rate of 50 ml/hr.    100 ml free water  flush every 4 hours   Tube feeding regimen provides 1800 kcal (100% of needs), 99 grams of protein, and 911 ml of H2O. Total free water : 1511 ml daily    NUTRITION DIAGNOSIS:   Inadequate oral intake related to inability to eat (pt sedated and ventilated) as evidenced by NPO status.  Ongoing  GOAL:   Patient will meet greater than or equal to 90% of their needs  Met with TF  MONITOR:   Diet advancement, TF tolerance  REASON FOR ASSESSMENT:   Consult Enteral/tube feeding initiation and management  ASSESSMENT:   34 y.o. female with medical history significant for NICM, CHF, DVT, HTN, esophageal ulcer, PEA arrest, C. Diff, CKD III, type 1 diabetes mellitus (brittle), peripheral neuropathy, suspected EPI (not tested), anxiety, depression, hypertension, severe malnutrition, diabetic gastroparesis requiring J-tube (s/p open placement 06/13/22, dislodged 06/2023), GERD, autoimmune thyroid  disease, celiac disease (reported/neg on biopsy), anemia, autoimmune polyendocrine syndrome type 2, adrenal insufficiency, hyperemesis in pregnancy and shingles who is admitted with AMS and hypoglycemia requiring intubation for airway protection.  1/25- OGT placed, KUB reveals gastric tube in appropriate position  1/26- MRI of brain WDL 1/27- s/p lumbar puncture 1/31- s/p EEG- suggestive of generalized cerebral dysfunction (encephalopathy); no seizures or epileptiform discharges were seen   2/1- extubated 2/2- s/p BSE- NPO, NGT placed- KUB revealed  tip in the antropyloric stomach or bulb of duodenum 2/3- rectal tube removed 2/4- s/p BSE- NPO  Reviewed I/O's: +661 ml x 24 hours and +11 L since admission  UOP: 1.3 L x 24  hours  Patient sitting up in bed with eyes closed. She did not respond to name being called. No family present.   Psych consulted to rule out catatonia; plan for IV lorazepam  trial.   Patient remains NPO with NGT for sole nutrition support. Glucerna 1.5 infusing at 50 ml/hr. She is tolerating well.  Reviewed weights; weight has ranged from 59.2-66.7 kg over the past 7 days. Patient remains with mild edema per assessment.   Medications reviewed and include solu-cortef  and protonix .   Labs reviewed: CBGS: 145-251 (inpatient orders for glycemic control are 0-6 units insulin  aspart every 4 hours, 2 units insulin  aspart every 4 hours, and 4 units insulin  glargine-yfgn daily). DM coordinator recommending 3 units insulin  aspart TID for tube feeding coverage.   Diet Order:   Diet Order             Diet NPO time specified  Diet effective now                   EDUCATION NEEDS:   No education needs have been identified at this time  Skin:  Skin Assessment: Reviewed RN Assessment  Last BM:  06/28/24 (type 7)  Height:   Ht Readings from Last 1 Encounters:  06/19/24 5' 0.98 (1.549 m)    Weight:   Wt Readings from Last 1 Encounters:  06/28/24 59.2 kg    Ideal Body Weight:  47.7 kg  BMI:  Body mass index is 24.67 kg/m.  Estimated Nutritional Needs:   Kcal:  1600-1800  Protein:  85-100 grams  Fluid:  1.6-1.8 L    Margery ORN, RD,  LDN, CDCES Registered Dietitian III Certified Diabetes Care and Education Specialist If unable to reach this RD, please use RD Inpatient group chat on secure chat between hours of 8am-4 pm daily

## 2024-06-28 NOTE — Progress Notes (Signed)
 " PROGRESS NOTE    Ashley Camacho   FMW:[id number] DOB: 1990-06-08  DOA: 06/17/2024 Date of Service: 06/28/24 which is hospital day 11  PCP: Ostwalt, [name], PA-C    [name] is a 34 year old female with history of adrenal insufficiency, insulin -dependent diabetes mellitus 1, gastroparesis, malnutrition status post J-tube placement, dilated cardiomyopathy, normocytic anemia, history of DVT not on anticoagulation at this time, CKD stage IIIb, hypertension, hypothyroid.   Hospital course / significant events: 01/25: EMS was called because patient was found unresponsive.  Per documentation, blood glucose with EMS was 27 mg/dL.  She was given 1 amp of glucagon  and blood glucose improved to 44 mg/dL.  In the ED, patient remained unresponsive, hypothermic, hypoxic on 3 L nasal cannula with pupils dilated.  An IO was placed in her right leg.  Patient was given 1 amp of D50.  Her blood glucose improved to 252 and despite increase in blood glucose, she remained unresponsive and hypoxic and was emergently intubated.  01/26: developed DKA, required insulin  gtt.  Patient remains mechanically intubated on minimal vent settings.   1/27: transition off insulin  gtt.  MRI brain was negative (obtained MRI due to continued acute encephalopathy and possible meningitis).  Patient was continued on ceftriaxone . Vancomycin / acyclovir  was added pending LP. 01/28: remains intubated due to encephalopathy.  Hemodynamically stable, not requiring vasopressor.  LP was inconclusive.  ID consulted.  Solu-Cortef  was decreased. 01/29: remains encephalopathic, precluding extubation.  ID recommended DC vancomycin .  Steroids continue to be de-escalated d/t HTN.  Neurology was consulted and recommended Ceribell for 24 hours and addressing metabolic issues. 01/30: Hypoglycemia to the 50s, Lantus  was decreased. 01/31:  Mentation continues to preclude extubation.  Patient is currently on Ceribell.  MRI brain wo contrast: Read as  no acute intracranial findings. Bilateral sphenoid sinus air-fluid levels, can be seen in acute sinusitis but is most likely related to intubated status.  02/01: Patient mentation improved.  Patient extubated. 02/02: mental status improved. Patient remains nonverbal and not following commands.  Neurology recommends holding TCA and Depakote .  Can confirm with patient family if patient is taking and can be restarted gradually.  Patient transferred to TR[facility] service. 02/03:  concern for selective mutism.  Patient is following with her eyes.  Patient opens and closes eyes on her own. [facility] consult ordered.  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, catatonia Plan trial benzo --> no improvement, getting EEG and neuro to follow   Psychaitry following   Anemia in chronic kidney disease (CKD) CKD stage 3b, GFR 30-44 ml/min Stable   Failure to thrive in adult Feeding tube in place   Type 1 diabetic Brittle diabetes Basal + sliding scale q4h while on tube feeds   Dyslipidemia Home rosuvastatin  10 mg daily per NG tube   Gastro-esophageal reflux disease with esophagitis Protonix  40 mg IV every 12 hours   Gastroenteritis Feeding tube   Hypothyroidism Levothyroxine  75 mcg per tube daily   Adrenal insufficiency (Addison's disease)  Solu-Cortef  50 mg IV twice daily   Essential hypertension  Losartan  50 mg daily per tube, metoprolol  tartrate 12.5 mg p.o. twice daily were resumed Hydralazine  10 to 20 mg IV every 4  hours as needed for SBP greater than 170 Labetalol  5 mg IV every 2 hours as needed for SBP greater than 170 if refractory to hydralazine       overweight based on BMI: Body mass index is 25.05 kg/m.SABRA Significantly low or high BMI is associated with higher medical risk.  Underweight - under 18  overweight - 25 to 29 obese - 30 or more Class 1 obesity: BMI of 30.0 to 34 Class 2 obesity: BMI of 35.0 to 39 Class 3 obesity: BMI of 40.0 to 49 Super Morbid Obesity: BMI 50-59 Super-super Morbid Obesity: BMI 60+ Healthy nutrition and physical activity advised as adjunct to other disease management and risk reduction treatments    DVT prophylaxis: heparin  IV fluids: no continuous IV fluids  Nutrition: tube feeds Central lines / other devices: NG tube in place  Code Status: FULL CODE ACP documentation reviewed: none on file in [facility]  TOC needs: TBD Medical barriers to dispo at this time: mental status. Expected readiness for discharge per TOC at this time:               Subjective / Brief ROS:  Patient not contributory   Family Communication: mother is at bedside on rounds    Objective Findings:  Vitals:   06/28/24 0515 06/28/24 0810 06/28/24 1217 06/28/24 1543  BP: (!) 162/62 (!) 144/76 (!) 155/62 (!) 162/56  Pulse: 90 (!) 105 84 88  Resp: 18 17 16 16   Temp: 98.3 F (36.8 C) 98.3 F (36.8 C) 99.1 F (37.3 C) 99.9 F (37.7 C)  TempSrc: Axillary   Oral  SpO2:  99% 99% 99%  Weight:      Height:        Intake/Output Summary (Last 24 hours) at 06/28/2024 1557 Last data filed at 06/28/2024 1239 Gross per 24 hour  Intake 1910.83 ml  Output 450 ml  Net 1460.83 ml   Filed Weights   06/26/24 0500 06/27/24 0500 06/28/24 0429  Weight: 64.9 kg 60.1 kg 59.2 kg    Examination:  Physical Exam Constitutional:      General: She is not in acute distress. Cardiovascular:     Rate and Rhythm: Normal rate and regular rhythm.  Pulmonary:     Effort: Pulmonary effort  is normal. No respiratory distress.     Breath sounds: Normal breath sounds.  Abdominal:     General: Bowel sounds are normal.     Palpations: Abdomen is soft.  Skin:    General: Skin is warm and dry.  Neurological:     Mental Status: She is alert.     Comments: Tracks with eyes but will not follow commands. Per RN, pt will say ouch or no when staff try to reposition her           Scheduled Medications:   Chlorhexidine  Gluconate Cloth  6 each Topical Daily   free water   100 mL Per Tube Q4H   heparin   5,000 Units Subcutaneous Q8H   hydrocortisone  sod succinate (SOLU-CORTEF ) inj  50 mg Intravenous Q12H   insulin  aspart  0-6 Units Subcutaneous Q4H   insulin  aspart  2 Units Subcutaneous Q4H   insulin  glargine-yfgn  4 Units Subcutaneous Daily   levothyroxine   75 mcg Per NG tube Q0600   losartan   50 mg Per Tube Daily   metoprolol  tartrate  12.5 mg Per  Tube BID   pantoprazole  (PROTONIX ) IV  40 mg Intravenous Q12H   rosuvastatin   10 mg Per NG tube Daily    Continuous Infusions:  feeding supplement (GLUCERNA 1.5 CAL) 50 mL/hr at 06/28/24 0421    PRN Medications:  acetaminophen , albuterol , dextrose , Gerhardt's butt cream, hydrALAZINE , labetalol , mouth rinse, polyethylene glycol, senna  Antimicrobials from admission:  Anti-infectives (From admission, onward)    Start     Dose/Rate Route Frequency Ordered Stop   06/22/24 1000  cefTRIAXone  (ROCEPHIN ) 2 g in sodium chloride  0.9 % 100 mL IVPB  Status:  Discontinued        2 g 200 mL/hr over 30 Minutes Intravenous Every 24 hours 06/21/24 1029 06/21/24 1030   06/22/24 1000  cefTRIAXone  (ROCEPHIN ) 2 g in sodium chloride  0.9 % 100 mL IVPB        2 g 200 mL/hr over 30 Minutes Intravenous Every 24 hours 06/21/24 1030 06/22/24 1900   06/19/24 1000  cefTRIAXone  (ROCEPHIN ) 2 g in sodium chloride  0.9 % 100 mL IVPB  Status:  Discontinued        2 g 200 mL/hr over 30 Minutes Intravenous Every 12 hours 06/19/24 0008 06/21/24 1029    06/19/24 0130  acyclovir  (ZOVIRAX ) 550 mg in dextrose  5 % 100 mL IVPB  Status:  Discontinued        10 mg/kg  55.2 kg 111 mL/hr over 60 Minutes Intravenous Every 12 hours 06/19/24 0039 06/20/24 0949   06/19/24 0130  vancomycin  (VANCOREADY) IVPB 750 mg/150 mL  Status:  Discontinued        750 mg 150 mL/hr over 60 Minutes Intravenous Every 24 hours 06/19/24 0042 06/21/24 1029   06/19/24 0115  cefTRIAXone  (ROCEPHIN ) 1 g in sodium chloride  0.9 % 100 mL IVPB        1 g 200 mL/hr over 30 Minutes Intravenous  Once 06/19/24 0018 06/19/24 0127   06/17/24 1700  cefTRIAXone  (ROCEPHIN ) 1 g in sodium chloride  0.9 % 100 mL IVPB  Status:  Discontinued        1 g 200 mL/hr over 30 Minutes Intravenous Every 24 hours 06/17/24 1558 06/19/24 0008   06/17/24 1600  azithromycin  (ZITHROMAX ) tablet 500 mg        500 mg Per Tube Daily 06/17/24 1558 06/22/24 0931   06/17/24 1600  vancomycin  (VANCOCIN ) IVPB 1000 mg/200 mL premix  Status:  Discontinued        1,000 mg 200 mL/hr over 60 Minutes Intravenous  Once 06/17/24 1558 06/17/24 1952           Data Reviewed:  I have personally reviewed the following...  CBC: Recent Labs  Lab 06/23/24 0358 06/24/24 0402 06/25/24 0555 06/26/24 0302 06/28/24 0736  WBC 9.9 11.4* 8.0 6.9 6.6  HGB 8.7* 8.7* 8.7* 9.0* 8.5*  HCT 27.5* 27.1* 26.6* 27.8* 26.7*  MCV 95.2 92.8 91.4 94.2 96.0  PLT 107* 136* 172 210 191   Basic Metabolic Panel: Recent Labs  Lab 06/22/24 0302 06/23/24 0358 06/24/24 0402 06/25/24 0555 06/26/24 0302 06/28/24 0736  NA 154* 145 139 144 148* 153*  K 3.4* 4.9 4.6 4.7 4.2 3.6  CL 127* 115* 109 113* 114* 119*  CO2 19* 21* 19* 20* 22 23  GLUCOSE 83 168* 145* 117* 154* 252*  BUN 32* 27* 37* 35* 35* 44*  CREATININE 1.68* 1.53* 1.50* 1.48* 1.55* 1.51*  CALCIUM  7.1* 7.7* 8.0* 8.2* 8.5* 8.4*  MG 2.0 1.8 2.5* 2.3 2.2  --   PHOS 1.9* 3.7  --   --  3.5  --    GFR: Estimated Creatinine Clearance: 43.8 mL/min (A) (by C-G formula based on SCr  of 1.51 mg/dL (H)). Liver Function Tests: Recent Labs  Lab 06/22/24 1537  AST 23  ALT 14  ALKPHOS 56  BILITOT <0.2  PROT 4.9*  ALBUMIN  2.8*   No results for input(s): LIPASE, AMYLASE in the last 168 hours. No results for input(s): AMMONIA in the last 168 hours. Coagulation Profile: No results for input(s): INR, PROTIME in the last 168 hours. Cardiac Enzymes: No results for input(s): CKTOTAL, CKMB, CKMBINDEX, TROPONINI in the last 168 hours. BNP (last 3 results) Recent Labs    12/21/23 1438  PROBNP >70000*   HbA1C: No results for input(s): HGBA1C in the last 72 hours. CBG: Recent Labs  Lab 06/27/24 2355 06/28/24 0354 06/28/24 0814 06/28/24 1209 06/28/24 1544  GLUCAP 145* 207* 226* 228* 169*   Lipid Profile: No results for input(s): CHOL, HDL, LDLCALC, TRIG, CHOLHDL, LDLDIRECT in the last 72 hours. Thyroid  Function Tests: No results for input(s): TSH, T4TOTAL, FREET4, T3FREE, THYROIDAB in the last 72 hours. Anemia Panel: No results for input(s): VITAMINB12, FOLATE, FERRITIN, TIBC, IRON , RETICCTPCT in the last 72 hours. Most Recent Urinalysis On File:     Component Value Date/Time   COLORURINE YELLOW (A) 06/17/2024 1538   APPEARANCEUR HAZY (A) 06/17/2024 1538   APPEARANCEUR Clear 12/21/2023 1438   LABSPEC 1.010 06/17/2024 1538   LABSPEC 1.026 03/01/2013 1844   PHURINE 5.0 06/17/2024 1538   GLUCOSEU >=500 (A) 06/17/2024 1538   GLUCOSEU >=500 03/01/2013 1844   HGBUR SMALL (A) 06/17/2024 1538   BILIRUBINUR NEGATIVE 06/17/2024 1538   BILIRUBINUR Negative 12/21/2023 1438   BILIRUBINUR Negative 03/01/2013 1844   KETONESUR NEGATIVE 06/17/2024 1538   PROTEINUR 100 (A) 06/17/2024 1538   UROBILINOGEN 0.2 10/23/2020 0953   NITRITE NEGATIVE 06/17/2024 1538   LEUKOCYTESUR SMALL (A) 06/17/2024 1538   LEUKOCYTESUR Negative 03/01/2013 1844   Sepsis Labs: @LABRCNTIP (procalcitonin:4,lacticidven:4) Microbiology: Recent  Results (from the past 240 hours)  SARS Coronavirus 2 by RT PCR (hospital order, performed in [facility] hospital lab) *cepheid single result test* Anterior Nasal Swab     Status: None   Collection Time: 06/18/24  8:15 PM   Specimen: Anterior Nasal Swab  Result Value Ref Range Status   SARS Coronavirus 2 by RT PCR NEGATIVE NEGATIVE Final    Comment: (NOTE) SARS-CoV-2 target nucleic acids are NOT DETECTED.  The SARS-CoV-2 RNA is generally detectable in upper and lower respiratory specimens during the acute phase of infection. The lowest concentration of SARS-CoV-2 viral copies this assay can detect is 250 copies / mL. A negative result does not preclude SARS-CoV-2 infection and should not be used as the sole basis for treatment or other patient management decisions.  A negative result may occur with improper specimen collection / handling, submission of specimen other than nasopharyngeal swab, presence of viral mutation(s) within the areas targeted by this assay, and inadequate number of viral copies (<250 copies / mL). A negative result must be combined with clinical observations, patient history, and epidemiological information.  Fact Sheet for Patients:   roadlaptop.co.za  Fact Sheet for Healthcare Providers: http://kim-miller.com/  This test is not yet approved or  cleared by the [place]  FDA and has been authorized for detection and/or diagnosis of SARS-CoV-2 by FDA under an Emergency Use Authorization (EUA).  This EUA will remain in effect (meaning this test can be used) for the duration of the COVID-19 declaration under Section 564(b)(1) of  the Act, 21 U.S.C. section 360bbb-3(b)(1), unless the authorization is terminated or revoked sooner.  Performed at [facility], [address] [place], [place] [address]   CSF culture w Gram Stain     Status: None   Collection Time: 06/19/24  2:33 PM   Specimen: PATH  Cytology CSF; Cerebrospinal Fluid  Result Value Ref Range Status   Specimen Description   Final    CSF Performed at [facility], [address], [place], [place] [address]    Special Requests   Final    NONE Performed at [facility], [address] [place], [place] [address]    Gram Stain   Final    NO ORGANISMS SEEN WBC SEEN RED BLOOD CELLS PRESENT Performed at [facility], [address] [place], [place] [address]    Culture   Final    NO GROWTH 3 DAYS Performed at [facility], [address] [place], [place] [address]    Report Status 06/22/2024 FINAL  Final  Culture, Fungus without Smear     Status: None (Preliminary result)   Collection Time: 06/19/24  2:33 PM   Specimen: PATH Cytology CSF; Cerebrospinal Fluid  Result Value Ref Range Status   Specimen Description   Final    CSF Performed at [facility], [address] [place], [place] [address]    Special Requests   Final    NONE Performed at [facility], [address] [place], [place] [address]    Culture   Final    NO FUNGUS ISOLATED AFTER 9 DAYS Performed at [facility], [address] [place], [place] [address]    Report Status PENDING  Incomplete  Anaerobic culture w Gram Stain     Status: None   Collection Time: 06/19/24  2:33 PM   Specimen: PATH Cytology CSF; Cerebrospinal Fluid  Result Value Ref Range Status   Specimen Description   Final    CSF Performed at [facility], [address], [place], [place] [address]    Special Requests   Final    NONE Performed at [facility], [address], [place], [place] [address]    Culture   Final    NO ANAEROBES ISOLATED Performed at [facility], [address], [place], [place] [address]    Report Status 06/24/2024 FINAL  Final      Radiology Studies last 3 days: DG Chest Port 1 View Result Date: 06/26/2024 CLINICAL DATA:  Left lower lobe consolidation  EXAM: PORTABLE CHEST 1 VIEW COMPARISON:  Abdominal radiograph dated 06/25/2024, chest radiograph dated 06/17/2024 FINDINGS: Lines/tubes: Enteric tube tip reaches the diaphragm and terminates below the field of view. Lungs: Medial lung apices are obscured by the overlying mandible. Persistent left retrocardiac consolidation and patchy bilateral lower lung opacity, left greater than right. Pleura: No pneumothorax or pleural effusion. Heart/mediastinum: The heart size and mediastinal contours are within normal limits. Bones: No acute osseous abnormality. IMPRESSION: Persistent left retrocardiac consolidation and patchy bilateral lower lobe opacities, which may represent atelectasis, aspiration, or pneumonia. Electronically Signed   By: Limin  Xu M.D.   On: 06/26/2024 10:15   DG Abd 1 View Result Date: 06/25/2024 EXAM: 1 VIEW XRAY OF THE ABDOMEN 06/25/2024 09:51:38 PM COMPARISON: Portable abdomen film 06/23/2024. CLINICAL HISTORY: Encounter for nasogastric (NG) tube placement. ICD10: Z46.59 Encounter for fitting and adjustment of other gastrointestinal appliance and device (nasogastric (NG) tube placement). FINDINGS: LINES, TUBES AND  DEVICES: NG tube has been removed and replaced with a dobbhoff feeding tube. The radiopaque tip is either in the antropyloric stomach or bulb of the duodenum, terminating to the right in the subhepatic area. BOWEL: Nonobstructive bowel gas pattern. SOFT TISSUES: No abnormal calcifications. The pelvis was not included in this exam. Limited exam for free air with no obvious free air being seen. BONES: No acute fracture. LUNGS AND PLEURA: There is a small left pleural effusion with increasing patchy consolidation in the left lower lobe. IMPRESSION: 1. Dobbhoff feeding tube with the radiopaque tip in the antropyloric stomach or bulb of duodenum, terminating to the right in the subhepatic area. 2. Small left pleural effusion with increasing patchy consolidation in the left lower lobe.  Electronically signed by: Francis Quam MD 06/25/2024 10:14 PM EST RP Workstation: [id number]       Time spent: 50 min     Laneta Blunt, DO [facility] 06/28/2024, 3:57 PM    Dictation software may have been used to generate the above note. Typos may occur and escape review in typed/dictated notes. Please contact Dr Blunt directly for clarity if needed.  Staff may message me via secure chat in Epic  but this may not receive an immediate response,  please page me for urgent matters!  If 7PM-7AM, please contact night coverage [email or web address]       "

## 2024-06-28 NOTE — Inpatient Diabetes Management (Signed)
 Inpatient Diabetes Program Recommendations  AACE/ADA: New Consensus Statement on Inpatient Glycemic Control (2015)  Target Ranges:  Prepandial:   less than 140 mg/dL      Peak postprandial:   less than 180 mg/dL (1-2 hours)      Critically ill patients:  140 - 180 mg/dL    Latest Reference Range & Units 06/27/24 23:55 06/28/24 03:54 06/28/24 08:14 06/28/24 12:09  Glucose-Capillary 70 - 99 mg/dL 854 (H)  2 units Novolog   207 (H)  4 units Novolog   226 (H)  4 units Novolog   4 units Semglee   228 (H)  4 units Novolog    (H): Data is abnormally high    History: Type 1 diabetes   Home DM Meds: OmniPod insulin  pump with Novolog , Dexcom G7 Off pump plan: Lantus  11 units daily, Novolog  1 unit per 20 grams of carbs plus correction of 1:70 mg/dl    Current Orders: Semglee  4 units daily      Novolog  0-6 units Q4H      Novolog  2 units Q4H for TF coverage    MD- Note pt remains on Solucortef TF infusing 50cc/hr  May consider increasing the Novolog  Tube Feed Coverage very slightly to 3 units Q4H    --Will follow patient during hospitalization--  Adina Rudolpho Arrow RN, MSN, CDCES Diabetes Coordinator Inpatient Glycemic Control Team Team Pager: [phone] (8a-5p)

## 2024-06-28 NOTE — Progress Notes (Signed)
 Eeg done

## 2024-06-29 LAB — CBC
HCT: 25.7 % — ABNORMAL LOW (ref 36.0–46.0)
Hemoglobin: 7.9 g/dL — ABNORMAL LOW (ref 12.0–15.0)
MCH: 30.2 pg (ref 26.0–34.0)
MCHC: 30.7 g/dL (ref 30.0–36.0)
MCV: 98.1 fL (ref 80.0–100.0)
Platelets: 163 10*3/uL (ref 150–400)
RBC: 2.62 MIL/uL — ABNORMAL LOW (ref 3.87–5.11)
RDW: 13.2 % (ref 11.5–15.5)
WBC: 8 10*3/uL (ref 4.0–10.5)
nRBC: 0.3 % — ABNORMAL HIGH (ref 0.0–0.2)

## 2024-06-29 LAB — BASIC METABOLIC PANEL WITH GFR
Anion gap: 9 (ref 5–15)
BUN: 48 mg/dL — ABNORMAL HIGH (ref 6–20)
CO2: 25 mmol/L (ref 22–32)
Calcium: 8 mg/dL — ABNORMAL LOW (ref 8.9–10.3)
Chloride: 121 mmol/L — ABNORMAL HIGH (ref 98–111)
Creatinine, Ser: 1.52 mg/dL — ABNORMAL HIGH (ref 0.44–1.00)
GFR, Estimated: 46 mL/min — ABNORMAL LOW
Glucose, Bld: 265 mg/dL — ABNORMAL HIGH (ref 70–99)
Potassium: 3.8 mmol/L (ref 3.5–5.1)
Sodium: 155 mmol/L — ABNORMAL HIGH (ref 135–145)

## 2024-06-29 LAB — GLUCOSE, CAPILLARY
Glucose-Capillary: 191 mg/dL — ABNORMAL HIGH (ref 70–99)
Glucose-Capillary: 271 mg/dL — ABNORMAL HIGH (ref 70–99)
Glucose-Capillary: 320 mg/dL — ABNORMAL HIGH (ref 70–99)
Glucose-Capillary: 180 mg/dL — ABNORMAL HIGH (ref 70–99)
Glucose-Capillary: 157 mg/dL — ABNORMAL HIGH (ref 70–99)

## 2024-06-29 LAB — CULTURE, FUNGUS WITHOUT SMEAR

## 2024-06-29 MED ORDER — FREE WATER
200.0000 mL | Status: AC
  Administered 2024-06-29 (×2): 200 mL

## 2024-06-29 MED ORDER — LORAZEPAM 2 MG/ML IJ SOLN
2.0000 mg | Freq: Two times a day (BID) | INTRAMUSCULAR | Status: AC
  Administered 2024-06-29 (×2): 2 mg via INTRAVENOUS
  Filled 2024-06-29 (×2): qty 1

## 2024-06-29 NOTE — Progress Notes (Signed)
 Subjective: Patient remains very unresponsive with eyes open Neurology was recalled to evaluate for any other recommendations  Objective: Current vital signs: BP (!) 176/60 (BP Location: Right Leg)   Pulse 78   Temp 99.1 F (37.3 C)   Resp 16   Ht 5' 0.98 (1.549 m)   Wt 60 kg   SpO2 98%   BMI 25.01 kg/m  Vital signs in last 24 hours: Temp:  [97.4 F (36.3 C)-99.9 F (37.7 C)] 99.1 F (37.3 C) (02/06 0752) Pulse Rate:  [68-88] 78 (02/06 0752) Resp:  [16-18] 16 (02/06 0752) BP: (152-176)/(54-62) 176/60 (02/06 0752) SpO2:  [96 %-100 %] 98 % (02/06 0752) Weight:  [60 kg] 60 kg (02/06 0438)  Intake/Output from previous day: 02/05 0701 - 02/06 0700 In: 2041.7 [NG/GT:2041.7] Out: 650 [Urine:650] Intake/Output this shift: Total I/O In: 100 [NG/GT:100] Out: -  Nutritional status:  Diet Order             Diet NPO time specified  Diet effective now                 General: In no acute distress HEENT: Normocephalic atraumatic Lungs: Clear Neurological exam She is in bed in no discomfort with eyes open When I introduced myself and greeted her, she looked at me and did track me but did not follow any commands. Nonverbal Cranial nerves: 2-12 grossly intact Motor examination: Does not follow commands to raise arms against gravity but when passively lifted against gravity, both arms are symmetrically weak without any focality.  Does not move the lower extremities to command but to noxious stimulation, grimaces and withdraws both of them strongly Sensory exam: As above Coordination cannot be assessed given her mentation    Lab Results: Basic Metabolic Panel: Recent Labs  Lab 06/23/24 0358 06/24/24 0402 06/25/24 0555 06/26/24 0302 06/28/24 0736 06/29/24 0436  NA 145 139 144 148* 153* 155*  K 4.9 4.6 4.7 4.2 3.6 3.8  CL 115* 109 113* 114* 119* 121*  CO2 21* 19* 20* 22 23 25   GLUCOSE 168* 145* 117* 154* 252* 265*  BUN 27* 37* 35* 35* 44* 48*  CREATININE 1.53*  1.50* 1.48* 1.55* 1.51* 1.52*  CALCIUM  7.7* 8.0* 8.2* 8.5* 8.4* 8.0*  MG 1.8 2.5* 2.3 2.2  --   --   PHOS 3.7  --   --  3.5  --   --     Liver Function Tests: Recent Labs  Lab 06/22/24 1537  AST 23  ALT 14  ALKPHOS 56  BILITOT <0.2  PROT 4.9*  ALBUMIN  2.8*   CBC: Recent Labs  Lab 06/24/24 0402 06/25/24 0555 06/26/24 0302 06/28/24 0736 06/29/24 0436  WBC 11.4* 8.0 6.9 6.6 8.0  HGB 8.7* 8.7* 9.0* 8.5* 7.9*  HCT 27.1* 26.6* 27.8* 26.7* 25.7*  MCV 92.8 91.4 94.2 96.0 98.1  PLT 136* 172 210 191 163    Lipid Panel: Recent Labs  Lab 06/24/24 0402  TRIG 91    CBG: Recent Labs  Lab 06/28/24 1544 06/28/24 2118 06/28/24 2343 06/29/24 0359 06/29/24 0753  GLUCAP 169* 123* 149* 191* 271*   Imaging: MRI brain x 2: Most recent 1 on 06/23/2024-no acute abnormality Neurodiagnostics: EEG adult Result Date: 06/29/2024 Shelton Arlin KIDD, MD     06/29/2024  7:57 AM Patient Name: Ashley Camacho MRN: [id number] Epilepsy Attending: Arlin KIDD Shelton Referring Physician/Provider: Voncile Isles, MD Date: 06/28/2024 Duration: 27.51 mins Patient history: 33yo F with ams. EEG to evaluate for seizure Level of  alertness: Awake, asleep AEDs during EEG study: None Technical aspects: This EEG study was done with scalp electrodes positioned according to the 10-20 International system of electrode placement. Electrical activity was reviewed with band pass filter of 1-70Hz , sensitivity of 7 uV/mm, display speed of 45mm/sec with a 60Hz  notched filter applied as appropriate. EEG data were recorded continuously and digitally stored.  Video monitoring was available and reviewed as appropriate. Description: The posterior dominant rhythm consists of 8 Hz activity of moderate voltage (25-35 uV) seen predominantly in posterior head regions, symmetric and reactive to eye opening and eye closing. Sleep was characterized by vertex waves, sleep spindles (12 to 14 Hz), maximal frontocentral region. There is intermittent  generalized 3 to 6 Hz theta-delta slowing. Hyperventilation and photic stimulation were not performed.   ABNORMALITY - Intermittent slow, generalized IMPRESSION: This study is suggestive of mild diffuse encephalopathy. No seizures or epileptiform discharges were seen throughout the recording. Priyanka O Yadav    Medications: I have reviewed the patient's current medications. Scheduled:  Chlorhexidine  Gluconate Cloth  6 each Topical Daily   free water   100 mL Per Tube Q4H   heparin   5,000 Units Subcutaneous Q8H   hydrocortisone  sod succinate (SOLU-CORTEF ) inj  50 mg Intravenous Q12H   insulin  aspart  0-6 Units Subcutaneous Q4H   insulin  aspart  2 Units Subcutaneous Q4H   insulin  glargine-yfgn  4 Units Subcutaneous Daily   levothyroxine   75 mcg Per NG tube Q0600   losartan   50 mg Per Tube Daily   metoprolol  tartrate  12.5 mg Per Tube BID   pantoprazole  (PROTONIX ) IV  40 mg Intravenous Q12H   rosuvastatin   10 mg Per NG tube Daily    Assessment/Plan: 34 y.o. female with a medical history of adrenal insufficiency, CHF, CKD, HTN, DM, thyroid , as indicated below who presented to the ED via EMS after being found unresponsive.  Patient was found unresponsive by her friend. Total downtime is uncertain. Blood glucose with EMS was 27 mg/dl. She was given an amp of glucagon  and blood glucose improved to 44 mg/dl.   Upon arrival in the ED, patient was still unresponsive, hypothermic, hypoxic on 3L [place] and pupils were dilated. An I/O was placed in her right leg. She given an amp of D50. Glucose improved to 252 mg/dl. Despite increase in blood glucose, she remained unresponsive and hypoxic hence she was emergently intubated. PCCM consulted for further management.  ED work-up: CT head and MRI of the brain were unremarkable. Routine EEG on 1/26 was only significant for slowing.  LP only remarkable for 41 wbcs and elevated glucose.  Cultures and meningitis/encephalitis panel were negative.     Patient has now been  extubated but remains unresponsive with eyes open and not following commands. Initial thought was that prolonged hypoglycemia might have caused slow recovery. There also seems to be some functional overlay on this There is no electrographic or imaging abnormality that is being seen on EEG or MRI of the brain. I suspect a more functional cause for her altered mental status  Recommendations: No further inpatient workup recommended from a neurological standpoint Medical management per primary team as you are Please call back with questions as needed Plan discussed with Dr. Marsa, hospitalist   -- Eligio Lav, MD Neurologist [facility]

## 2024-06-29 NOTE — Consult Note (Cosign Needed)
 [facility] Consult Follow Up  Patient Name: .Ashley Camacho  MRN: [id number]  DOB: [birth year]  Consult Order details:  Orders (From admission, onward)     Start     Ordered   06/26/24 1128  IP CONSULT TO PSYCHIATRY       Ordering Provider: Sherre Greig SAILOR, DO  Provider:  (Not yet assigned)  Question Answer Comment  Location [facility]   Reason for Consult? selective mutism?      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 female admitted medically.    Assessment: Catatonia, rule out. Concern due to mutism, stupor/decreased responsiveness, negativism (refusal of interventions, moving away from examiner).  Plan:  Trial of IV lorazepam  challenge test  Plan discussed with MD Marsa. Will reassess following lorazepam  trial.  06/29/2024:  Psychiatry rounded on patient today. Patient opened eyes when name called but immediately closed eyes and continued to sleep. Patient refused to engage further in exam.  Case Discussion: Thoroughly reviewed with supervising psychiatrist Dr. Jadapalle. Neurology reconsulted by primary team; per review of their notes, concern for functional neurological disorder.  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 Marsa that interventions are limited at this time, as typical management for functional disorder is outpatient CBT, which cannot be implemented in current clinical state.  Given persistent negative symptoms (pulling away from provider, moving feet away, refusal to engage), will trial  lorazepam  2 mg IV scheduled twice daily to assess for improvement in responsiveness and engagement over the weekend. This may improve patient's ability to participate in treatment.  Primary medical team updated on current plan.  Psychiatry will continue to follow and monitor patient's response to above interventions.  Reassess mental status and engagement after trial of scheduled lorazepam . If no improvement with benzodiazepine trial, will reassess treatment options and disposition planning with primary team.  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)   History of DVT (deep vein thrombosis)   Depression, recurrent   ABLA (acute blood loss anemia)   Dyslipidemia   Psychiatric disturbance    Plan   ## Disposition: Patient is not a candidate for inpatient psychiatric admission as symptoms at this time do not appear primarily psychiatric in origin- will continue to round on patient during medical hospitalization and assist medical team with medications management  ## Psychiatric Medication Recommendations: Trial of IV ativan  2 mg BID and assess patient response to intervention   ## Medical Decision Making Capacity: Not specifically addressed in this encounter  ## Further Work-up:  EKG- QTC:  507 on 06/17/24 Labs: cbc, cmp  ## Behavioral / Environmental: -Delirium Precautions: Delirium Interventions for Nursing and Staff: - RN to open blinds every AM.  - To Bedside: Glasses, hearing aide, and pt's own shoes. Make available to patients. when possible and encourage use. - Encourage po fluids when appropriate, keep fluids within reach. - OOB to chair with meals. - Passive ROM exercises to all extremities with AM & PM care. - RN to assess orientation to person, time and place QAM and PRN. - Recommend extended visitation hours with familiar family/friends as feasible. - Staff to minimize disturbances at night. Turn off television when pt asleep or when not in use.    ## Safety and Observation Level:  - Based on my clinical evaluation, I estimate the patient to be at low risk of self harm in the current setting. - At this time, we recommend  routine. This decision is based on my review of the chart including patient's history and current presentation, interview of the patient, mental status examination, and consideration of suicide risk including evaluating suicidal ideation, plan, intent, suicidal or self-harm behaviors, risk factors, and protective factors. This judgment is based on our ability to directly address suicide risk, implement suicide prevention strategies, and develop a safety plan while the patient is in the clinical setting. Please contact our team if there is a concern that risk level has changed.  CSSR Risk Category:C-SSRS RISK CATEGORY:  Garment/textile Technologist Visit from 06/13/2024 in [facility] Of [place] ED to Hosp-Admission (Discharged) from 05/27/2024 in [facility] Hosp-Admission (Discharged) from 03/26/2024 in [facility] (1A)  C-SSRS RISK CATEGORY Error: Q3, 4, or 5 should not be populated when Q2 is No No Risk No Risk     Suicide Risk Assessment: Patient has following modifiable risk factors for suicide: pain, medical illness (ie new dx of cancer), which we are addressing by assisting medical team with medication  management.  Patient has following non-modifiable or demographic risk factors for suicide: N/A  Patient has the following protective factors against suicide: Supportive family and no history of suicide attempts  Thank you for this consult request. Recommendations have been communicated to the primary team.  We will continue to round at this time.       History of Present Illness  Relevant Aspects of Hospital Hospital   Patient Report:   SUBJECTIVE/OBJECTIVE Psychiatry consulted by primary medical team for concern of potential selective mutism in patient on medical floor.  Mental Status Exam: Patient had eyes open, looking at examiner, but demonstrated profound unresponsiveness. No verbal responses elicited despite multiple attempts, only single verbal response was ow when this provider attempted to touch patient's arm for assessment. When asked to wiggle toes and feet tapped, patient moved feet away from provider. Per primary medical team report, patient states no and refuses nursing interventions.  Collateral from Mother: No history of similar presentation. Patient had been intubated following cardiac arrest a couple of years ago but did not present like this at that time. Mother denies any known psychiatric history.   Psychiatric and Social History  Psychiatric History:  Information collected from Chart/mother contributed to most due to patient limited participation during today's exam  Prev Dx/Sx: Mother reported none that she knew of Current Psych Provider: Mother denied Home Meds (current): Mother unsure Previous Med Trials:UTA  Therapy: UTA Prior Psych Hospitalization: Mother denied Prior  Suicide attempt/Self Harm: Mother denied Prior Violence: Mother denied  Family Psych History: UTA Family Hx suicide: UTA  Social History:  Educational Hx: UTA Occupational Hx: Chief Financial Officer Hx: UTA Living Situation: UTA Spiritual Hx: UTA Access to weapons/lethal means: Family  denied   Substance History Alcohol: UTA Last Drink :UTA Number of drinks per day : UTA History of alcohol withdrawal seizures: UTA History of DT's:UTA Tobacco: UTA Illicit drugs: UTA Prescription drug abuse: UTA Rehab hx: UTA  Exam Findings  Physical Exam: Reviewed and agree with the physical exam findings conducted by the medical provider Vital Signs:  Temp:  [97.4 F (36.3 C)-99.9 F (37.7 C)] 98.8 F (37.1 C) (02/06 1123) Pulse Rate:  [57-88] 57 (02/06 1123) Resp:  [16-18] 16 (02/06 1123) BP: (152-200)/(54-68) 200/68 (02/06 1123) SpO2:  [96 %-100 %] 97 % (02/06 1123) Weight:  [60 kg] 60 kg (02/06 0438) Blood pressure (!) 200/68, pulse (!) 57, temperature 98.8 F (37.1 C), resp. rate 16, height 5' 0.98 (1.549 m), weight 60 kg, SpO2 97%. Body mass index is 25.01 kg/m.    Mental Status Exam: General Appearance: Fairly Groomed  Orientation:  Other:  UTA  Memory:  UTA  Concentration:  Concentration: Poor  Recall:  Poor  Attention  Poor  Eye Contact:  Fair  Speech:  Negative  Language:  Negative  Volume:  UTA  Mood: Anxious  Affect:  Congruent  Thought Process:  UTA  Thought Content:  UTA  Suicidal Thoughts:  UTA  Homicidal Thoughts:  UTA  Judgement:  Other:  UTA  Insight:  UTA  Psychomotor Activity:  Psychomotor Retardation  Akathisia:  UTA  Fund of Knowledge:  UTA      Assets:  Social Support  Cognition:  Impaired,  Severe  ADL's:  Impaired  AIMS (if indicated):        Other History   These have been pulled in through the EMR, reviewed, and updated if appropriate.  Family History:  The patient's family history includes ADD / ADHD in her daughter; Breast cancer in her mother; CAD in her father, paternal grandfather, and paternal grandmother; Cancer in her mother; Diabetes in her father, maternal uncle, paternal grandfather, and paternal grandmother; Diabetes type I in her father; Early death in her father; Lung cancer in her maternal grandfather;  Seizures in her mother.  Medical History: Past Medical History:  Diagnosis Date   Acute metabolic encephalopathy 06/10/2022   Adrenal insufficiency    Anemia    Anxiety    Blood transfusion without reported diagnosis    CHF (congestive heart failure) (HCC)    Chronic kidney disease    Depression    Gastroparesis    Hypertension    Hypotension    Intractable nausea and vomiting 11/13/2022   Migraine    Thyroid  disease    Type 1 diabetes [facility])     Surgical History: Past Surgical History:  Procedure Laterality Date   BIOPSY  01/14/2021   Procedure: BIOPSY;  Surgeon: Eda Iha, MD;  Location: [facility] ENDOSCOPY;  Service: Gastroenterology;;   CENTRAL LINE INSERTION N/A 10/10/2023   Procedure: CENTRAL LINE INSERTION;  Surgeon: Marea Selinda RAMAN, MD;  Location: [facility] INVASIVE CV LAB;  Service: Cardiovascular;  Laterality: N/A;   CESAREAN SECTION     x2   CESAREAN SECTION WITH BILATERAL TUBAL LIGATION N/A 02/17/2021   Procedure: CESAREAN SECTION WITH BILATERAL TUBAL LIGATION;  Surgeon: Barbra Lang PARAS, DO;  Location: [facility] LD ORS;  Service: Obstetrics;  Laterality: N/A;  ESOPHAGOGASTRODUODENOSCOPY (EGD) WITH PROPOFOL  N/A 01/14/2021   Procedure: ESOPHAGOGASTRODUODENOSCOPY (EGD) WITH PROPOFOL ;  Surgeon: Eda Iha, MD;  Location: [facility] ENDOSCOPY;  Service: Gastroenterology;  Laterality: N/A;   IR BONE MARROW BIOPSY & ASPIRATION  12/16/2022   IR REPLC DUODEN/JEJUNO TUBE PERCUT W/FLUORO  11/22/2022   JEJUNOSTOMY N/A 06/13/2022   Procedure: DEXTER;  Surgeon: Jordis Laneta FALCON, MD;  Location: [facility] ORS;  Service: General;  Laterality: N/A;   MOUTH SURGERY     TEE WITHOUT CARDIOVERSION N/A 01/06/2024   Procedure: ECHOCARDIOGRAM, TRANSESOPHAGEAL;  Surgeon: Rolan Ezra RAMAN, MD;  Location: [facility] ORS;  Service: Cardiovascular;  Laterality: N/A;   TUBAL LIGATION       Medications:  Current Medications[1]  Allergies: Allergies[2]  A. Claudene, NP \\This  note was created using [employer]. Please excuse any inadvertent transcription errors. Case was discussed with supervising physician Dr. Jadapalle who is agreeable with current plan.       [1]  Current Facility-Administered Medications:    acetaminophen  (TYLENOL ) tablet 1,000 mg, 1,000 mg, Per NG tube, Q6H PRN, Tukov-Yual, Magdalene S, NP, 1,000 mg at 06/28/24 2121   albuterol  (PROVENTIL ) (2.5 MG/3ML) 0.083% nebulizer solution 2.5 mg, 2.5 mg, Nebulization, Q6H PRN, Tukov-Yual, Magdalene S, NP   Chlorhexidine  Gluconate Cloth 2 % PADS 6 each, 6 each, Topical, Daily, Dgayli, Khabib, MD, 6 each at 06/28/24 2121   dextrose  50 % solution 0-50 mL, 0-50 mL, Intravenous, PRN, Ouma, Elizabeth Achieng, NP, 50 mL at 06/23/24 0324   feeding supplement (GLUCERNA 1.5 CAL) liquid 1,000 mL, 1,000 mL, Per Tube, Continuous, Cox, Amy N, DO, Last Rate: 50 mL/hr at 06/28/24 2118, 1,000 mL at 06/28/24 2118   free water  100 mL, 100 mL, Per Tube, Q4H, Rust-Chester, Britton L, NP, 100 mL at 06/29/24 1200   Gerhardt's butt cream 1 Application, 1 Application, Topical, Daily PRN, Marsa Edelman, DO, 1 Application at 06/29/24 0542   heparin  injection 5,000 Units, 5,000 Units, Subcutaneous, Q8H, Tukov-Yual, Magdalene S, NP, 5,000 Units at 06/29/24 1318   hydrALAZINE  (APRESOLINE ) injection 10-20 mg, 10-20 mg, Intravenous, Q4H PRN, Bousman, Karlie, PA-C, 20 mg at 06/28/24 0434   hydrocortisone  sodium succinate  (SOLU-CORTEF ) 100 MG injection 50 mg, 50 mg, Intravenous, Q12H, Dgayli, Khabib, MD, 50 mg at 06/29/24 9060   insulin  aspart (novoLOG ) injection 0-6 Units, 0-6 Units, Subcutaneous, Q4H, Cox, Amy N, DO, 4 Units at 06/29/24 1159   insulin  aspart (novoLOG ) injection 2 Units, 2 Units, Subcutaneous, Q4H, Alexander, Natalie, DO, 2 Units at 06/29/24 1159   insulin  glargine-yfgn injection 4 Units, 4 Units, Subcutaneous, Daily, Alexander, Natalie, DO, 4 Units at 06/29/24 9060   labetalol  (NORMODYNE ) injection 5 mg, 5 mg, Intravenous, Q2H PRN,  Cox, Amy N, DO   levothyroxine  (SYNTHROID ) tablet 75 mcg, 75 mcg, Per NG tube, Q0600, Tukov-Yual, Magdalene S, NP, 75 mcg at 06/29/24 0530   losartan  (COZAAR ) tablet 50 mg, 50 mg, Per Tube, Daily, Clair Marolyn NOVAK, RPH, 50 mg at 06/29/24 9061   metoprolol  tartrate (LOPRESSOR ) tablet 12.5 mg, 12.5 mg, Per Tube, BID, Clair Marolyn NOVAK, RPH, 12.5 mg at 06/29/24 9061   Oral care mouth rinse, 15 mL, Mouth Rinse, PRN, Assaker, Jean-Pierre, MD   pantoprazole  (PROTONIX ) injection 40 mg, 40 mg, Intravenous, Q12H, Rust-Chester, Britton L, NP, 40 mg at 06/29/24 0938   polyethylene glycol (MIRALAX  / GLYCOLAX ) packet 17 g, 17 g, Per Tube, Daily PRN, Tukov-Yual, Magdalene S, NP, 17 g at 06/27/24 2130   rosuvastatin  (CRESTOR ) tablet 10 mg, 10 mg, Per NG  tube, Daily, Tukov-Yual, Magdalene S, NP, 10 mg at 06/29/24 9061   senna (SENOKOT) tablet 8.6 mg, 1 tablet, Per Tube, BID PRN, Tukov-Yual, Magdalene S, NP [2] No Known Allergies

## 2024-06-29 NOTE — Progress Notes (Signed)
 Occupational Therapy Treatment Patient Details Name: Ashley Camacho MRN: [id number] DOB: 02/03/1991 Today's Date: 06/29/2024   History of present illness Pt is a 34 y/o F admitted on 06/17/24 after presenting after being found unresponsive. Pt admitted for severe hypoglycemia & acute hypoxic respiratory failure, required intubation, extubated 06/24/24. PMH: DM1, CKA, CKD, HTN, gastroparesis, adrenal insufficiency   OT comments  Pt seen for limited OT tx. Pt briefly opens eyes but does not really visually track to noxious stimuli only. Hand over hand TOTAL A for attempts at washing her face. Does actively resist further attempts and attempts to wipe her mouth. Visually grimaces briefly/intermittently. OT facilitated PROM with mild resistence noted with progressive attempts, grimacing noted with attempts at end range shoulder flexion. Will continue to progress as able.       If plan is discharge home, recommend the following:  A lot of help with walking and/or transfers;A lot of help with bathing/dressing/bathroom   Equipment Recommendations  Other (comment) (defer)    Recommendations for Other Services      Precautions / Restrictions Precautions Precautions: Fall Recall of Precautions/Restrictions: Impaired Restrictions Weight Bearing Restrictions Per Provider Order: No       Mobility Bed Mobility               General bed mobility comments: dep for repositioning    Transfers                   General transfer comment: deferred         ADL either performed or assessed with clinical judgement   ADL Overall ADL's : Needs assistance/impaired     Grooming: Bed level;Wash/dry face;Wash/dry hands;Total assistance Grooming Details (indicate cue type and reason): hand over hand using LUE requiring near TOTAL A to wash face, did rotate head slightly to facilitate better access to R side of the face, grimacing and actively resisting with 2nd attempt              Communication Communication Communication: Impaired Factors Affecting Communication: Difficulty expressing self   Cognition Arousal: Stuporous Behavior During Therapy: Flat affect Cognition: Cognition impaired, Difficult to assess Difficult to assess due to: Level of arousal           OT - Cognition Comments: briefly opens eyes to noxious stimuli and grimaces, unable to follow any commands, does actively resist attempts to engage in bed level grooming                 Following commands: Impaired Following commands impaired:  (unable to follow any commands)      Cueing                 Pertinent Vitals/ Pain       Pain Assessment Pain Assessment: PAINAD Breathing: normal Negative Vocalization: none Facial Expression: facial grimacing Body Language: tense, distressed pacing, fidgeting Consolability: no need to console PAINAD Score: 3 Pain Descriptors / Indicators: Grimacing Pain Intervention(s): Monitored during session, Repositioned   Frequency  Min 2X/week        Progress Toward Goals  OT Goals(current goals can now be found in the care plan section)  Progress towards OT goals: OT to reassess next treatment  Acute Rehab OT Goals OT Goal Formulation: Patient unable to participate in goal setting   AM-PAC OT 6 Clicks Daily Activity     Outcome Measure   Help from another person eating meals?: Total Help from another person taking care of personal grooming?: Total Help  from another person toileting, which includes using toliet, bedpan, or urinal?: Total Help from another person bathing (including washing, rinsing, drying)?: Total Help from another person to put on and taking off regular upper body clothing?: Total Help from another person to put on and taking off regular lower body clothing?: Total 6 Click Score: 6    End of Session    OT Visit Diagnosis: Muscle weakness (generalized) (M62.81);Other abnormalities of gait and mobility  (R26.89);Other symptoms and signs involving cognitive function   Activity Tolerance Patient limited by lethargy   Patient Left in bed;with call bell/phone within reach;with bed alarm set   Nurse Communication          Time: 1139-1150 OT Time Calculation (min): 11 min  Charges: OT General Charges $OT Visit: 1 Visit OT Treatments $Self Care/Home Management : 8-22 mins  Warren SAUNDERS., MPH, MS, OTR/L ascom [phone] 06/29/24, 12:06 PM

## 2024-06-29 NOTE — TOC Progression Note (Signed)
 Transition of Care [facility]) - Progression Note    Patient Details  Name: ALEIYA RYE MRN: [id number] Date of Birth: 1991/01/19  [facility] CM/SW Contact  Grayce JAYSON Perfect, RN Phone Number: 06/29/2024, 3:04 PM  Clinical Narrative:   RNCM went to patient room, opens eyes, but immediately closes them and no verbal response.  No call back received from her mother, noone at bedside at this time.      Barriers to Discharge: Continued Medical Work up               Expected Discharge Plan and Services                                               Social Drivers of Health (SDOH) Interventions SDOH Screenings   Food Insecurity: Patient Unable To Answer (06/17/2024)  Recent Concern: Food Insecurity - Food Insecurity Present (05/30/2024)  Housing: Patient Unable To Answer (06/17/2024)  Recent Concern: Housing - High Risk (05/30/2024)  Transportation Needs: Patient Unable To Answer (06/17/2024)  Utilities: Patient Unable To Answer (06/17/2024)  Alcohol Screen: Low Risk (04/11/2024)  Depression (PHQ2-9): Medium Risk (06/13/2024)  Financial Resource Strain: Medium Risk (04/11/2024)  Physical Activity: Insufficiently Active (04/11/2024)  Social Connections: Socially Isolated (04/11/2024)  Stress: Stress Concern Present (04/11/2024)  Tobacco Use: Low Risk (06/26/2024)  Health Literacy: Adequate Health Literacy (12/21/2023)    Readmission Risk Interventions    06/18/2024   12:07 PM 01/04/2024   12:41 PM 11/15/2023    4:19 PM  Readmission Risk Prevention Plan  Transportation Screening  Complete Complete  Medication Review (RN Care Manager) Complete Complete Complete  PCP or Specialist appointment within 3-5 days of discharge Complete Complete Complete  HRI or Home Care Consult Complete    SW Recovery Care/Counseling Consult Complete Complete Complete  Palliative Care Screening Not Applicable Not Applicable Not Applicable  Skilled Nursing Facility Not Applicable  Not Applicable Not Applicable

## 2024-06-29 NOTE — Progress Notes (Signed)
 Physical Therapy Treatment Patient Details Name: Ashley Camacho MRN: [id number] DOB: December 05, 1990 Today's Date: 06/29/2024   History of Present Illness Pt is a 34 y/o F admitted on 06/17/24 after presenting after being found unresponsive. Pt admitted for severe hypoglycemia & acute hypoxic respiratory failure, required intubation, extubated 06/24/24. PMH: DM1, CKA, CKD, HTN, gastroparesis, adrenal insufficiency    PT Comments  Patient alert, eyes open but did not speak or follow commands. Did nod her head once when PT mentioned eating food. totalAx2 for all mobility including rolling for pericare. Able to sit EOB with CGA for several minutes for ADLs. Left with mobility and nurse tech at end of session. The patient would benefit from further skilled PT intervention to continue to progress towards goals as able.     If plan is discharge home, recommend the following: Two people to help with walking and/or transfers;Assistance with feeding;Assist for transportation;Direct supervision/assist for medications management;Two people to help with bathing/dressing/bathroom;Help with stairs or ramp for entrance;Assistance with cooking/housework;Direct supervision/assist for financial management;Supervision due to cognitive status   Can travel by private vehicle     No  Equipment Recommendations  Other (comment) (TBD)    Recommendations for Other Services       Precautions / Restrictions Precautions Precautions: Fall Recall of Precautions/Restrictions: Impaired Restrictions Weight Bearing Restrictions Per Provider Order: No     Mobility  Bed Mobility Overal bed mobility: Needs Assistance Bed Mobility: Supine to Sit, Sit to Supine, Rolling Rolling: Total assist, +2 for physical assistance   Supine to sit: +2 for safety/equipment, Total assist          Transfers                   General transfer comment: deferred    Ambulation/Gait                   Stairs              Wheelchair Mobility     Tilt Bed    Modified Rankin (Stroke Patients Only)       Balance Overall balance assessment: Needs assistance Sitting-balance support: Feet supported Sitting balance-Leahy Scale: Fair Sitting balance - Comments: progressed to CGA once repositioned                                    Communication    Cognition Arousal: Stuporous Behavior During Therapy: Flat affect   PT - Cognitive impairments: No family/caregiver present to determine baseline, Difficult to assess, Orientation, Memory, Attention, Initiation, Sequencing, Problem solving, Safety/Judgement, Awareness Difficult to assess due to: Level of arousal                     PT - Cognition Comments: she nodded her head once when told about eating Following commands: Impaired Following commands impaired:  (unable to follow commands)    Cueing Cueing Techniques: Verbal cues, Gestural cues, Visual cues, Tactile cues  Exercises      General Comments        Pertinent Vitals/Pain Pain Assessment Pain Assessment: PAINAD Faces Pain Scale: Hurts a little bit Pain Location: grimacing with mobility, ADL Pain Intervention(s): Limited activity within patient's tolerance, Monitored during session    Home Living                          Prior Function  PT Goals (current goals can now be found in the care plan section) Progress towards PT goals: Progressing toward goals    Frequency    Min 2X/week      PT Plan      Co-evaluation              AM-PAC PT 6 Clicks Mobility   Outcome Measure  Help needed turning from your back to your side while in a flat bed without using bedrails?: Total Help needed moving from lying on your back to sitting on the side of a flat bed without using bedrails?: Total Help needed moving to and from a bed to a chair (including a wheelchair)?: Total Help needed standing up from a chair using your arms  (e.g., wheelchair or bedside chair)?: Total Help needed to walk in hospital room?: Total Help needed climbing 3-5 steps with a railing? : Total 6 Click Score: 6    End of Session   Activity Tolerance:  (limited by cognition) Patient left: Other (comment) (seated with mobility at EOB) Nurse Communication: Mobility status PT Visit Diagnosis: Muscle weakness (generalized) (M62.81);Other abnormalities of gait and mobility (R26.89);Difficulty in walking, not elsewhere classified (R26.2);Unsteadiness on feet (R26.81)     Time: 8585-8563 PT Time Calculation (min) (ACUTE ONLY): 22 min  Charges:    $Therapeutic Activity: 8-22 mins PT General Charges $$ ACUTE PT VISIT: 1 Visit                    Doyal Shams PT, DPT 4:12 PM,06/29/24

## 2024-06-29 NOTE — Plan of Care (Signed)

## 2024-06-29 NOTE — Progress Notes (Signed)
 " PROGRESS NOTE    Ashley Camacho   FMW:[id number] DOB: [birth year]  DOA: 06/17/2024 Date of Service: 06/29/24 which is hospital day 12  PCP: Ostwalt, [name], PA-C    Ashley Camacho is a 34 year old female with history of adrenal insufficiency, insulin -dependent diabetes mellitus 1, gastroparesis, malnutrition status post J-tube placement, dilated cardiomyopathy, normocytic anemia, history of DVT not on anticoagulation at this time, CKD stage IIIb, hypertension, hypothyroid.   Hospital course / significant events: 01/25: EMS was called because patient was found unresponsive.  Per documentation, blood glucose with EMS was 27 mg/dL.  She was given 1 amp of glucagon  and blood glucose improved to 44 mg/dL.  In the ED, patient remained unresponsive, hypothermic, hypoxic on 3 L nasal cannula with pupils dilated.  An IO was placed in her right leg.  Patient was given 1 amp of D50.  Her blood glucose improved to 252 and despite increase in blood glucose, she remained unresponsive and hypoxic and was emergently intubated.  01/26: developed DKA, required insulin  gtt.  Patient remains mechanically intubated on minimal vent settings.   1/27: transition off insulin  gtt.  MRI brain was negative (obtained MRI due to continued acute encephalopathy and possible meningitis).  Patient was continued on ceftriaxone . Vancomycin / acyclovir  was added pending LP. 01/28: remains intubated due to encephalopathy.  Hemodynamically stable, not requiring vasopressor.  LP was inconclusive.  ID consulted.  Solu-Cortef  was decreased. 01/29: remains encephalopathic, precluding extubation.  ID recommended DC vancomycin .  Steroids continue to be de-escalated d/t HTN.  Neurology was consulted and recommended Ceribell for 24 hours and addressing metabolic issues. 01/30: Hypoglycemia to the 50s, Lantus  was decreased. 01/31:  Mentation continues to preclude extubation.  Patient is currently on Ceribell.  MRI brain wo contrast: Read as  no acute intracranial findings. Bilateral sphenoid sinus air-fluid levels, can be seen in acute sinusitis but is most likely related to intubated status.  02/01: Patient mentation improved.  Patient extubated. 02/02: mental status improved. Patient remains nonverbal and not following commands.  Neurology recommends holding TCA and Depakote .  Can confirm with patient family if patient is taking and can be restarted gradually.  Patient transferred to [facility] service. 02/03:  concern for selective mutism.  Patient is following with her eyes.  Patient opens and closes eyes on her own. [facility] consult ordered.  02/04: Psychiatry consult today - plan trial benzo possible catatonia 02/05: no improvement, getting EEG and neuro to follow   02/06: EEG suggestive of mild diffuse encephalopathy. No seizures or epileptiform discharges were seen. Per neurology, no evidence for organic cause, suspect functional / psychiatric.      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, catatonia Per neurology, no electrographic or imaging abnormality that is being seen on EEG or MRI of the brain, suspect a more functional cause for her altered mental status and may improve with time but prognosis uncertain  benzo --> no improvement initially --> re-attempt higher dose this weekend Psychaitry following   Anemia in chronic kidney disease (CKD) CKD stage 3b, GFR 30-44 ml/min Stable Monitor BMP   Hypernatremia Increase free water  in NG Monitor BMP  Failure to thrive in adult Feeding tube in  place   Type 1 diabetic Brittle diabetes Basal + sliding scale q4h while on tube  feeds   Dyslipidemia Home rosuvastatin  10 mg daily per NG tube   Gastro-esophageal reflux disease with esophagitis Protonix  40 mg IV every 12 hours   Gastroenteritis Feeding tube   Hypothyroidism Levothyroxine  75 mcg per tube daily   Adrenal insufficiency (Addison's disease)  Solu-Cortef  50 mg IV twice daily   Essential hypertension Losartan  50 mg daily per tube, metoprolol  tartrate 12.5 mg p.o. twice daily were resumed Hydralazine  10 to 20 mg IV every 4 hours as needed for SBP greater than 170 Labetalol  5 mg IV every 2 hours as needed for SBP greater than 170 if refractory to hydralazine       overweight based on BMI: Body mass index is 25.05 kg/m.SABRA Significantly low or high BMI is associated with higher medical risk.  Underweight - under 18  overweight - 25 to 29 obese - 30 or more Class 1 obesity: BMI of 30.0 to 34 Class 2 obesity: BMI of 35.0 to 39 Class 3 obesity: BMI of 40.0 to 49 Super Morbid Obesity: BMI 50-59 Super-super Morbid Obesity: BMI 60+ Healthy nutrition and physical activity advised as adjunct to other disease management and risk reduction treatments    DVT prophylaxis: heparin  IV fluids: no continuous IV fluids  Nutrition: tube feeds Central lines / other devices: NG tube in place  Code Status: FULL CODE ACP documentation reviewed: none on file in [facility]  TOC needs: TBD Medical barriers to dispo at this time: mental status. Expected readiness for discharge per TOC at this time:               Subjective / Brief ROS:  Patient not contributory   Family Communication: call to mother Nena 06/29/24 4:01 PM discussed assessment/plan and all questions answered       Objective Findings:  Vitals:   06/29/24 0438 06/29/24 0752 06/29/24 1123 06/29/24 1547  BP:  (!) 176/60 (!) 200/68 (!) 184/62  Pulse:  78 (!) 57 67  Resp:  16 16 15   Temp:  99.1 F (37.3 C) 98.8 F (37.1 C) 99 F (37.2 C)  TempSrc:    Oral  SpO2:  98% 97% 99%   Weight: 60 kg     Height:        Intake/Output Summary (Last 24 hours) at 06/29/2024 1601 Last data filed at 06/29/2024 1200 Gross per 24 hour  Intake 2041.67 ml  Output 650 ml  Net 1391.67 ml   Filed Weights   06/28/24 0429 06/29/24 0347 06/29/24 0438  Weight: 59.2 kg 60 kg 60 kg    Examination:  Physical Exam Constitutional:      General: She is not in acute distress. Cardiovascular:     Rate and Rhythm: Normal rate and regular rhythm.  Pulmonary:     Effort: Pulmonary effort is normal. No respiratory distress.     Breath sounds: Normal breath sounds.  Abdominal:     General: Bowel sounds are normal.     Palpations: Abdomen is soft.  Skin:    General: Skin is warm and dry.  Neurological:     Mental Status: She is alert.     Comments: Tracks with eyes but will not follow commands. Per RN, pt will say ouch or no when staff try to reposition her           Scheduled Medications:   Chlorhexidine  Gluconate Cloth  6 each Topical Daily   free water   200 mL Per Tube Q4H   heparin   5,000 Units Subcutaneous Q8H   hydrocortisone  sod succinate (SOLU-CORTEF ) inj  50 mg Intravenous Q12H   insulin  aspart  0-6 Units Subcutaneous Q4H   insulin  aspart  2 Units Subcutaneous Q4H   insulin  glargine-yfgn  4 Units Subcutaneous Daily   levothyroxine   75 mcg Per NG tube Q0600   LORazepam   2 mg Intravenous BID   losartan   50 mg Per Tube Daily   metoprolol  tartrate  12.5 mg Per Tube BID   pantoprazole  (PROTONIX ) IV  40 mg Intravenous Q12H   rosuvastatin   10 mg Per NG tube Daily    Continuous Infusions:  feeding supplement (GLUCERNA 1.5 CAL) 1,000 mL (06/28/24 2118)    PRN Medications:  acetaminophen , albuterol , dextrose , Gerhardt's butt cream, hydrALAZINE , labetalol , mouth rinse, polyethylene glycol, senna  Antimicrobials from admission:  Anti-infectives (From admission, onward)    Start     Dose/Rate Route Frequency Ordered Stop   06/22/24 1000  cefTRIAXone  (ROCEPHIN ) 2 g  in sodium chloride  0.9 % 100 mL IVPB  Status:  Discontinued        2 g 200 mL/hr over 30 Minutes Intravenous Every 24 hours 06/21/24 1029 06/21/24 1030   06/22/24 1000  cefTRIAXone  (ROCEPHIN ) 2 g in sodium chloride  0.9 % 100 mL IVPB        2 g 200 mL/hr over 30 Minutes Intravenous Every 24 hours 06/21/24 1030 06/22/24 1900   06/19/24 1000  cefTRIAXone  (ROCEPHIN ) 2 g in sodium chloride  0.9 % 100 mL IVPB  Status:  Discontinued        2 g 200 mL/hr over 30 Minutes Intravenous Every 12 hours 06/19/24 0008 06/21/24 1029   06/19/24 0130  acyclovir  (ZOVIRAX ) 550 mg in dextrose  5 % 100 mL IVPB  Status:  Discontinued        10 mg/kg  55.2 kg 111 mL/hr over 60 Minutes Intravenous Every 12 hours 06/19/24 0039 06/20/24 0949   06/19/24 0130  vancomycin  (VANCOREADY) IVPB 750 mg/150 mL  Status:  Discontinued        750 mg 150 mL/hr over 60 Minutes Intravenous Every 24 hours 06/19/24 0042 06/21/24 1029   06/19/24 0115  cefTRIAXone  (ROCEPHIN ) 1 g in sodium chloride  0.9 % 100 mL IVPB        1 g 200 mL/hr over 30 Minutes Intravenous  Once 06/19/24 0018 06/19/24 0127   06/17/24 1700  cefTRIAXone  (ROCEPHIN ) 1 g in sodium chloride  0.9 % 100 mL IVPB  Status:  Discontinued        1 g 200 mL/hr over 30 Minutes Intravenous Every 24 hours 06/17/24 1558 06/19/24 0008   06/17/24 1600  azithromycin  (ZITHROMAX ) tablet 500 mg        500 mg Per Tube Daily 06/17/24 1558 06/22/24 0931   06/17/24 1600  vancomycin  (VANCOCIN ) IVPB 1000 mg/200 mL premix  Status:  Discontinued        1,000 mg 200 mL/hr over 60 Minutes Intravenous  Once 06/17/24 1558 06/17/24 1952           Data Reviewed:  I have personally reviewed the following...  CBC: Recent Labs  Lab 06/24/24 0402 06/25/24 0555 06/26/24 0302 06/28/24 0736 06/29/24 0436  WBC 11.4* 8.0 6.9 6.6 8.0  HGB 8.7* 8.7* 9.0* 8.5* 7.9*  HCT 27.1* 26.6* 27.8* 26.7* 25.7*  MCV 92.8 91.4 94.2 96.0 98.1  PLT 136* 172 210 191 163   Basic Metabolic Panel: Recent  Labs  Lab 06/23/24 0358 06/24/24  9597 06/25/24 0555 06/26/24 0302 06/28/24 0736 06/29/24 0436  NA 145 139 144 148* 153* 155*  K 4.9 4.6 4.7 4.2 3.6 3.8  CL 115* 109 113* 114* 119* 121*  CO2 21* 19* 20* 22 23 25   GLUCOSE 168* 145* 117* 154* 252* 265*  BUN 27* 37* 35* 35* 44* 48*  CREATININE 1.53* 1.50* 1.48* 1.55* 1.51* 1.52*  CALCIUM  7.7* 8.0* 8.2* 8.5* 8.4* 8.0*  MG 1.8 2.5* 2.3 2.2  --   --   PHOS 3.7  --   --  3.5  --   --    GFR: Estimated Creatinine Clearance: 43.8 mL/min (A) (by C-G formula based on SCr of 1.52 mg/dL (H)). Liver Function Tests: No results for input(s): AST, ALT, ALKPHOS, BILITOT, PROT, ALBUMIN  in the last 168 hours.  No results for input(s): LIPASE, AMYLASE in the last 168 hours. No results for input(s): AMMONIA in the last 168 hours. Coagulation Profile: No results for input(s): INR, PROTIME in the last 168 hours. Cardiac Enzymes: No results for input(s): CKTOTAL, CKMB, CKMBINDEX, TROPONINI in the last 168 hours. BNP (last 3 results) Recent Labs    12/21/23 1438  PROBNP >70000*   HbA1C: No results for input(s): HGBA1C in the last 72 hours. CBG: Recent Labs  Lab 06/28/24 2343 06/29/24 0359 06/29/24 0753 06/29/24 1124 06/29/24 1548  GLUCAP 149* 191* 271* 320* 180*   Lipid Profile: No results for input(s): CHOL, HDL, LDLCALC, TRIG, CHOLHDL, LDLDIRECT in the last 72 hours. Thyroid  Function Tests: No results for input(s): TSH, T4TOTAL, FREET4, T3FREE, THYROIDAB in the last 72 hours. Anemia Panel: No results for input(s): VITAMINB12, FOLATE, FERRITIN, TIBC, IRON , RETICCTPCT in the last 72 hours. Most Recent Urinalysis On File:     Component Value Date/Time   COLORURINE YELLOW (A) 06/17/2024 1538   APPEARANCEUR HAZY (A) 06/17/2024 1538   APPEARANCEUR Clear 12/21/2023 1438   LABSPEC 1.010 06/17/2024 1538   LABSPEC 1.026 03/01/2013 1844   PHURINE 5.0 06/17/2024 1538    GLUCOSEU >=500 (A) 06/17/2024 1538   GLUCOSEU >=500 03/01/2013 1844   HGBUR SMALL (A) 06/17/2024 1538   BILIRUBINUR NEGATIVE 06/17/2024 1538   BILIRUBINUR Negative 12/21/2023 1438   BILIRUBINUR Negative 03/01/2013 1844   KETONESUR NEGATIVE 06/17/2024 1538   PROTEINUR 100 (A) 06/17/2024 1538   UROBILINOGEN 0.2 10/23/2020 0953   NITRITE NEGATIVE 06/17/2024 1538   LEUKOCYTESUR SMALL (A) 06/17/2024 1538   LEUKOCYTESUR Negative 03/01/2013 1844   Sepsis Labs: @LABRCNTIP (procalcitonin:4,lacticidven:4) Microbiology: No results found for this or any previous visit (from the past 240 hours).     Radiology Studies last 3 days: EEG adult Result Date: 06/29/2024 Shelton Arlin KIDD, MD     06/29/2024  7:57 AM Patient Name: Ashley Camacho MRN: [id number] Epilepsy Attending: Arlin KIDD Shelton Referring Physician/Provider: Voncile Isles, MD Date: 06/28/2024 Duration: 27.51 mins Patient history: 33yo F with ams. EEG to evaluate for seizure Level of alertness: Awake, asleep AEDs during EEG study: None Technical aspects: This EEG study was done with scalp electrodes positioned according to the 10-20 International system of electrode placement. Electrical activity was reviewed with band pass filter of 1-70Hz , sensitivity of 7 uV/mm, display speed of 46mm/sec with a 60Hz  notched filter applied as appropriate. EEG data were recorded continuously and digitally stored.  Video monitoring was available and reviewed as appropriate. Description: The posterior dominant rhythm consists of 8 Hz activity of moderate voltage (25-35 uV) seen predominantly in posterior head regions, symmetric and reactive to eye opening and eye closing. Sleep  was characterized by vertex waves, sleep spindles (12 to 14 Hz), maximal frontocentral region. There is intermittent generalized 3 to 6 Hz theta-delta slowing. Hyperventilation and photic stimulation were not performed.   ABNORMALITY - Intermittent slow, generalized IMPRESSION: This study is  suggestive of mild diffuse encephalopathy. No seizures or epileptiform discharges were seen throughout the recording. Arlin MALVA Krebs   DG Chest Port 1 View Result Date: 06/26/2024 CLINICAL DATA:  Left lower lobe consolidation EXAM: PORTABLE CHEST 1 VIEW COMPARISON:  Abdominal radiograph dated 06/25/2024, chest radiograph dated 06/17/2024 FINDINGS: Lines/tubes: Enteric tube tip reaches the diaphragm and terminates below the field of view. Lungs: Medial lung apices are obscured by the overlying mandible. Persistent left retrocardiac consolidation and patchy bilateral lower lung opacity, left greater than right. Pleura: No pneumothorax or pleural effusion. Heart/mediastinum: The heart size and mediastinal contours are within normal limits. Bones: No acute osseous abnormality. IMPRESSION: Persistent left retrocardiac consolidation and patchy bilateral lower lobe opacities, which may represent atelectasis, aspiration, or pneumonia. Electronically Signed   By: Limin  Xu M.D.   On: 06/26/2024 10:15   DG Abd 1 View Result Date: 06/25/2024 EXAM: 1 VIEW XRAY OF THE ABDOMEN 06/25/2024 09:51:38 PM COMPARISON: Portable abdomen film 06/23/2024. CLINICAL HISTORY: Encounter for nasogastric (NG) tube placement. ICD10: Z46.59 Encounter for fitting and adjustment of other gastrointestinal appliance and device (nasogastric (NG) tube placement). FINDINGS: LINES, TUBES AND DEVICES: NG tube has been removed and replaced with a dobbhoff feeding tube. The radiopaque tip is either in the antropyloric stomach or bulb of the duodenum, terminating to the right in the subhepatic area. BOWEL: Nonobstructive bowel gas pattern. SOFT TISSUES: No abnormal calcifications. The pelvis was not included in this exam. Limited exam for free air with no obvious free air being seen. BONES: No acute fracture. LUNGS AND PLEURA: There is a small left pleural effusion with increasing patchy consolidation in the left lower lobe. IMPRESSION: 1. Dobbhoff  feeding tube with the radiopaque tip in the antropyloric stomach or bulb of duodenum, terminating to the right in the subhepatic area. 2. Small left pleural effusion with increasing patchy consolidation in the left lower lobe. Electronically signed by: Francis Quam MD 06/25/2024 10:14 PM EST RP Workstation: [id number]       Time spent: 50 min     Laneta Blunt, DO [facility] 06/29/2024, 4:01 PM    Dictation software may have been used to generate the above note. Typos may occur and escape review in typed/dictated notes. Please contact Dr Blunt directly for clarity if needed.  Staff may message me via secure chat in Epic  but this may not receive an immediate response,  please page me for urgent matters!  If 7PM-7AM, please contact night coverage [email or web address]       "

## 2024-06-29 NOTE — Procedures (Signed)
 Patient Name: TRENITA HULME  MRN: [id number]  Epilepsy Attending: Arlin MALVA Krebs  Referring Physician/Provider: Voncile Isles, MD  Date: 06/28/2024 Duration: 27.51 mins  Patient history: 33yo F with ams. EEG to evaluate for seizure  Level of alertness: Awake, asleep  AEDs during EEG study: None  Technical aspects: This EEG study was done with scalp electrodes positioned according to the 10-20 International system of electrode placement. Electrical activity was reviewed with band pass filter of 1-70Hz , sensitivity of 7 uV/mm, display speed of 76mm/sec with a 60Hz  notched filter applied as appropriate. EEG data were recorded continuously and digitally stored.  Video monitoring was available and reviewed as appropriate.  Description: The posterior dominant rhythm consists of 8 Hz activity of moderate voltage (25-35 uV) seen predominantly in posterior head regions, symmetric and reactive to eye opening and eye closing. Sleep was characterized by vertex waves, sleep spindles (12 to 14 Hz), maximal frontocentral region. There is intermittent generalized 3 to 6 Hz theta-delta slowing. Hyperventilation and photic stimulation were not performed.     ABNORMALITY - Intermittent slow, generalized  IMPRESSION: This study is suggestive of mild diffuse encephalopathy. No seizures or epileptiform discharges were seen throughout the recording.  Aylen Stradford O Jameka Ivie

## 2024-07-02 ENCOUNTER — Ambulatory Visit: Admitting: Family

## 2024-07-23 ENCOUNTER — Ambulatory Visit: Admitting: Physician Assistant

## 2024-08-21 ENCOUNTER — Other Ambulatory Visit
